# Patient Record
Sex: Female | Born: 1935 | Race: White | Hispanic: No | Smoking: Former smoker
Health system: Southern US, Community
[De-identification: ages and names within clinical notes are randomized; demographics above are authoritative.]

## PROBLEM LIST (undated history)

## (undated) DIAGNOSIS — I4821 Permanent atrial fibrillation: Secondary | ICD-10-CM

## (undated) DIAGNOSIS — M1611 Unilateral primary osteoarthritis, right hip: Secondary | ICD-10-CM

## (undated) DIAGNOSIS — M1990 Unspecified osteoarthritis, unspecified site: Secondary | ICD-10-CM

## (undated) DIAGNOSIS — R079 Chest pain, unspecified: Secondary | ICD-10-CM

## (undated) DIAGNOSIS — R7689 Other specified abnormal immunological findings in serum: Secondary | ICD-10-CM

## (undated) DIAGNOSIS — Z9889 Other specified postprocedural states: Secondary | ICD-10-CM

## (undated) DIAGNOSIS — I959 Hypotension, unspecified: Secondary | ICD-10-CM

## (undated) DIAGNOSIS — R300 Dysuria: Secondary | ICD-10-CM

## (undated) DIAGNOSIS — R609 Edema, unspecified: Secondary | ICD-10-CM

## (undated) DIAGNOSIS — E7800 Pure hypercholesterolemia, unspecified: Secondary | ICD-10-CM

## (undated) DIAGNOSIS — I2510 Atherosclerotic heart disease of native coronary artery without angina pectoris: Secondary | ICD-10-CM

## (undated) DIAGNOSIS — I739 Peripheral vascular disease, unspecified: Secondary | ICD-10-CM

## (undated) DIAGNOSIS — I4891 Unspecified atrial fibrillation: Secondary | ICD-10-CM

## (undated) DIAGNOSIS — Z7901 Long term (current) use of anticoagulants: Secondary | ICD-10-CM

## (undated) DIAGNOSIS — I209 Angina pectoris, unspecified: Secondary | ICD-10-CM

## (undated) DIAGNOSIS — C259 Malignant neoplasm of pancreas, unspecified: Secondary | ICD-10-CM

## (undated) DIAGNOSIS — J4489 Other specified chronic obstructive pulmonary disease: Secondary | ICD-10-CM

## (undated) DIAGNOSIS — D689 Coagulation defect, unspecified: Secondary | ICD-10-CM

## (undated) DIAGNOSIS — N183 Chronic kidney disease, stage 3 unspecified: Secondary | ICD-10-CM

## (undated) DIAGNOSIS — I878 Other specified disorders of veins: Secondary | ICD-10-CM

## (undated) DIAGNOSIS — N189 Chronic kidney disease, unspecified: Secondary | ICD-10-CM

## (undated) DIAGNOSIS — J42 Unspecified chronic bronchitis: Secondary | ICD-10-CM

## (undated) DIAGNOSIS — Z85528 Personal history of other malignant neoplasm of kidney: Secondary | ICD-10-CM

## (undated) DIAGNOSIS — D509 Iron deficiency anemia, unspecified: Secondary | ICD-10-CM

## (undated) DIAGNOSIS — Z1231 Encounter for screening mammogram for malignant neoplasm of breast: Secondary | ICD-10-CM

## (undated) DIAGNOSIS — I350 Nonrheumatic aortic (valve) stenosis: Secondary | ICD-10-CM

## (undated) DIAGNOSIS — C649 Malignant neoplasm of unspecified kidney, except renal pelvis: Secondary | ICD-10-CM

## (undated) DIAGNOSIS — Z9289 Personal history of other medical treatment: Secondary | ICD-10-CM

## (undated) DIAGNOSIS — T40695S Adverse effect of other narcotics, sequela: Secondary | ICD-10-CM

## (undated) DIAGNOSIS — N3941 Urge incontinence: Secondary | ICD-10-CM

## (undated) DIAGNOSIS — J449 Chronic obstructive pulmonary disease, unspecified: Secondary | ICD-10-CM

## (undated) DIAGNOSIS — Z5181 Encounter for therapeutic drug level monitoring: Secondary | ICD-10-CM

## (undated) DIAGNOSIS — D649 Anemia, unspecified: Secondary | ICD-10-CM

## (undated) DIAGNOSIS — R112 Nausea with vomiting, unspecified: Secondary | ICD-10-CM

## (undated) DIAGNOSIS — I6529 Occlusion and stenosis of unspecified carotid artery: Secondary | ICD-10-CM

## (undated) DIAGNOSIS — I429 Cardiomyopathy, unspecified: Secondary | ICD-10-CM

## (undated) DIAGNOSIS — Z950 Presence of cardiac pacemaker: Secondary | ICD-10-CM

## (undated) DIAGNOSIS — I5032 Chronic diastolic (congestive) heart failure: Secondary | ICD-10-CM

## (undated) DIAGNOSIS — Z95810 Presence of automatic (implantable) cardiac defibrillator: Secondary | ICD-10-CM

## (undated) DIAGNOSIS — Z9981 Dependence on supplemental oxygen: Secondary | ICD-10-CM

## (undated) DIAGNOSIS — F4322 Adjustment disorder with anxiety: Secondary | ICD-10-CM

## (undated) DIAGNOSIS — I10 Essential (primary) hypertension: Secondary | ICD-10-CM

## (undated) DIAGNOSIS — I272 Pulmonary hypertension, unspecified: Secondary | ICD-10-CM

## (undated) DIAGNOSIS — R0602 Shortness of breath: Secondary | ICD-10-CM

## (undated) DIAGNOSIS — N390 Urinary tract infection, site not specified: Secondary | ICD-10-CM

## (undated) DIAGNOSIS — K625 Hemorrhage of anus and rectum: Secondary | ICD-10-CM

## (undated) DIAGNOSIS — I5030 Unspecified diastolic (congestive) heart failure: Secondary | ICD-10-CM

## (undated) DIAGNOSIS — J018 Other acute sinusitis: Secondary | ICD-10-CM

## (undated) DIAGNOSIS — K921 Melena: Secondary | ICD-10-CM

## (undated) DIAGNOSIS — R5383 Other fatigue: Secondary | ICD-10-CM

## (undated) DIAGNOSIS — Z13820 Encounter for screening for osteoporosis: Secondary | ICD-10-CM

## (undated) DIAGNOSIS — R768 Other specified abnormal immunological findings in serum: Secondary | ICD-10-CM

## (undated) DIAGNOSIS — R001 Bradycardia, unspecified: Secondary | ICD-10-CM

## (undated) DIAGNOSIS — R5381 Other malaise: Secondary | ICD-10-CM

## (undated) DIAGNOSIS — Z01810 Encounter for preprocedural cardiovascular examination: Secondary | ICD-10-CM

## (undated) HISTORY — DX: Adjustment disorder with anxiety: F43.22

## (undated) HISTORY — DX: Hypotension, unspecified: I95.9

## (undated) HISTORY — DX: Melena: K92.1

## (undated) HISTORY — DX: Long term (current) use of anticoagulants: Z79.01

## (undated) HISTORY — DX: Urge incontinence: N39.41

## (undated) HISTORY — DX: Personal history of other malignant neoplasm of kidney: Z85.528

## (undated) HISTORY — DX: Other malaise: R53.81

## (undated) HISTORY — DX: Malignant neoplasm of unspecified kidney, except renal pelvis: C64.9

## (undated) HISTORY — DX: Encounter for preprocedural cardiovascular examination: Z01.810

## (undated) HISTORY — DX: Encounter for screening mammogram for malignant neoplasm of breast: Z12.31

## (undated) HISTORY — PX: JOINT REPLACEMENT: SHX530

## (undated) HISTORY — DX: Encounter for screening for osteoporosis: Z13.820

## (undated) HISTORY — DX: Chronic kidney disease, unspecified: N18.9

## (undated) HISTORY — DX: Hemorrhage of anus and rectum: K62.5

## (undated) HISTORY — DX: Other specified chronic obstructive pulmonary disease: J44.89

## (undated) HISTORY — DX: Bradycardia, unspecified: R00.1

## (undated) HISTORY — DX: Chest pain, unspecified: R07.9

## (undated) HISTORY — PX: CATARACT EXTRACTION W/ INTRAOCULAR LENS  IMPLANT, BILATERAL: SHX1307

## (undated) HISTORY — DX: Coagulation defect, unspecified: D68.9

## (undated) HISTORY — DX: Edema, unspecified: R60.9

## (undated) HISTORY — DX: Chronic obstructive pulmonary disease, unspecified: J44.9

## (undated) HISTORY — DX: Other fatigue: R53.83

## (undated) HISTORY — DX: Encounter for therapeutic drug level monitoring: Z51.81

## (undated) HISTORY — DX: Anemia, unspecified: D64.9

## (undated) HISTORY — DX: Dysuria: R30.0

## (undated) HISTORY — PX: APPENDECTOMY: SHX54

## (undated) HISTORY — DX: Essential (primary) hypertension: I10

## (undated) HISTORY — DX: Chronic diastolic (congestive) heart failure: I50.32

## (undated) HISTORY — DX: Occlusion and stenosis of unspecified carotid artery: I65.29

## (undated) HISTORY — DX: Other acute sinusitis: J01.80

## (undated) HISTORY — DX: Unspecified atrial fibrillation: I48.91

## (undated) HISTORY — DX: Cardiomyopathy, unspecified: I42.9

---

## 1973-05-06 HISTORY — PX: ABDOMINAL HYSTERECTOMY: SHX81

## 2001-06-06 DIAGNOSIS — C649 Malignant neoplasm of unspecified kidney, except renal pelvis: Secondary | ICD-10-CM

## 2001-06-06 HISTORY — DX: Malignant neoplasm of unspecified kidney, except renal pelvis: C64.9

## 2001-06-06 HISTORY — PX: NEPHRECTOMY: SHX65

## 2003-05-07 DIAGNOSIS — C649 Malignant neoplasm of unspecified kidney, except renal pelvis: Secondary | ICD-10-CM

## 2003-05-07 HISTORY — DX: Malignant neoplasm of unspecified kidney, except renal pelvis: C64.9

## 2004-05-06 HISTORY — PX: CARDIAC CATHETERIZATION: SHX172

## 2005-02-22 ENCOUNTER — Inpatient Hospital Stay (HOSPITAL_COMMUNITY): Admission: EM | Admit: 2005-02-22 | Discharge: 2005-03-02 | Payer: Self-pay | Admitting: Emergency Medicine

## 2005-02-22 ENCOUNTER — Ambulatory Visit: Payer: Self-pay | Admitting: Internal Medicine

## 2005-02-25 ENCOUNTER — Encounter: Payer: Self-pay | Admitting: Internal Medicine

## 2005-02-25 ENCOUNTER — Ambulatory Visit: Payer: Self-pay | Admitting: Internal Medicine

## 2005-03-11 ENCOUNTER — Ambulatory Visit: Payer: Self-pay

## 2005-03-25 ENCOUNTER — Ambulatory Visit: Payer: Self-pay | Admitting: Internal Medicine

## 2005-05-14 ENCOUNTER — Ambulatory Visit: Payer: Self-pay | Admitting: Internal Medicine

## 2005-05-17 ENCOUNTER — Ambulatory Visit: Payer: Self-pay | Admitting: Internal Medicine

## 2005-06-25 ENCOUNTER — Ambulatory Visit: Payer: Self-pay | Admitting: Internal Medicine

## 2005-07-22 ENCOUNTER — Ambulatory Visit: Payer: Self-pay | Admitting: Internal Medicine

## 2005-11-28 ENCOUNTER — Ambulatory Visit: Payer: Self-pay

## 2005-12-17 ENCOUNTER — Ambulatory Visit: Payer: Self-pay | Admitting: Internal Medicine

## 2005-12-31 ENCOUNTER — Ambulatory Visit: Payer: Self-pay

## 2005-12-31 ENCOUNTER — Encounter: Payer: Self-pay | Admitting: Cardiovascular Disease

## 2006-01-07 ENCOUNTER — Ambulatory Visit: Payer: Self-pay | Admitting: Ophthalmology

## 2006-01-13 ENCOUNTER — Ambulatory Visit: Payer: Self-pay | Admitting: Ophthalmology

## 2006-02-17 ENCOUNTER — Ambulatory Visit: Payer: Self-pay | Admitting: Ophthalmology

## 2006-02-24 ENCOUNTER — Ambulatory Visit: Payer: Self-pay | Admitting: Ophthalmology

## 2006-06-10 ENCOUNTER — Ambulatory Visit: Payer: Self-pay | Admitting: Internal Medicine

## 2006-06-25 ENCOUNTER — Ambulatory Visit: Payer: Self-pay

## 2006-06-25 ENCOUNTER — Encounter: Payer: Self-pay | Admitting: Cardiology

## 2006-07-22 ENCOUNTER — Ambulatory Visit: Payer: Self-pay | Admitting: Internal Medicine

## 2006-08-26 ENCOUNTER — Ambulatory Visit: Payer: Self-pay | Admitting: Internal Medicine

## 2006-08-30 ENCOUNTER — Emergency Department: Payer: Self-pay | Admitting: Emergency Medicine

## 2007-02-12 ENCOUNTER — Ambulatory Visit: Payer: Self-pay | Admitting: Internal Medicine

## 2007-03-04 ENCOUNTER — Ambulatory Visit: Payer: Self-pay

## 2007-03-04 ENCOUNTER — Encounter: Payer: Self-pay | Admitting: Internal Medicine

## 2007-03-04 LAB — CONVERTED CEMR LAB
ALT: 15 units/L (ref 0–35)
AST: 18 units/L (ref 0–37)
Albumin: 4.5 g/dL (ref 3.5–5.2)
Alkaline Phosphatase: 63 units/L (ref 39–117)
BUN: 27 mg/dL — ABNORMAL HIGH (ref 6–23)
CO2: 29 meq/L (ref 19–32)
Calcium: 10.1 mg/dL (ref 8.4–10.5)
Chloride: 100 meq/L (ref 96–112)
Cholesterol: 211 mg/dL — ABNORMAL HIGH (ref 0–200)
Creatinine, Ser: 1.36 mg/dL — ABNORMAL HIGH (ref 0.40–1.20)
Glucose, Bld: 85 mg/dL (ref 70–99)
HDL: 45 mg/dL (ref >39)
LDL Cholesterol: 120 mg/dL — ABNORMAL HIGH (ref 0–99)
Potassium: 4.1 meq/L (ref 3.5–5.3)
Sodium: 142 meq/L (ref 135–145)
Total Bilirubin: 0.5 mg/dL (ref 0.3–1.2)
Total CHOL/HDL Ratio: 4.7
Total Protein: 7.2 g/dL (ref 6.0–8.3)
Triglycerides: 230 mg/dL — ABNORMAL HIGH (ref <150)
VLDL: 46 mg/dL — ABNORMAL HIGH (ref 0–40)

## 2007-03-17 ENCOUNTER — Ambulatory Visit: Payer: Self-pay

## 2007-03-17 ENCOUNTER — Encounter: Payer: Self-pay | Admitting: Internal Medicine

## 2007-05-22 ENCOUNTER — Ambulatory Visit: Payer: Self-pay | Admitting: Internal Medicine

## 2007-06-03 ENCOUNTER — Ambulatory Visit: Payer: Self-pay | Admitting: Internal Medicine

## 2007-06-03 ENCOUNTER — Ambulatory Visit: Payer: Self-pay

## 2007-07-30 ENCOUNTER — Ambulatory Visit: Payer: Self-pay | Admitting: Internal Medicine

## 2007-07-31 ENCOUNTER — Encounter: Payer: Self-pay | Admitting: Internal Medicine

## 2007-07-31 LAB — CONVERTED CEMR LAB
ALT: 13 units/L (ref 0–35)
AST: 18 units/L (ref 0–37)
Albumin: 4.7 g/dL (ref 3.5–5.2)
Alkaline Phosphatase: 53 units/L (ref 39–117)
BUN: 22 mg/dL (ref 6–23)
Basophils Absolute: 0 10*3/uL (ref 0.0–0.1)
Basophils Relative: 0 % (ref 0–1)
CO2: 27 meq/L (ref 19–32)
Calcium: 9.8 mg/dL (ref 8.4–10.5)
Chloride: 99 meq/L (ref 96–112)
Creatinine, Ser: 1.44 mg/dL — ABNORMAL HIGH (ref 0.40–1.20)
Eosinophils Absolute: 0.1 10*3/uL (ref 0.0–0.7)
Eosinophils Relative: 2 % (ref 0–5)
Glucose, Bld: 81 mg/dL (ref 70–99)
HCT: 42.5 % (ref 36.0–46.0)
Hemoglobin: 13.6 g/dL (ref 12.0–15.0)
INR: 2.4 — ABNORMAL HIGH (ref 0.0–1.5)
Lymphocytes Relative: 22 % (ref 12–46)
Lymphs Abs: 1.2 10*3/uL (ref 0.7–4.0)
MCHC: 32 g/dL (ref 30.0–36.0)
MCV: 91.4 fL (ref 78.0–100.0)
Monocytes Absolute: 0.5 10*3/uL (ref 0.1–1.0)
Monocytes Relative: 9 % (ref 3–12)
Neutro Abs: 3.7 10*3/uL (ref 1.7–7.7)
Neutrophils Relative %: 66 % (ref 43–77)
Platelets: 212 10*3/uL (ref 150–400)
Potassium: 4.3 meq/L (ref 3.5–5.3)
Prothrombin Time: 27.3 s — ABNORMAL HIGH (ref 11.6–15.2)
RBC: 4.65 M/uL (ref 3.87–5.11)
RDW: 15.1 % (ref 11.5–15.5)
Sodium: 142 meq/L (ref 135–145)
Total Bilirubin: 0.5 mg/dL (ref 0.3–1.2)
Total Protein: 7.4 g/dL (ref 6.0–8.3)
WBC: 5.6 10*3/uL (ref 4.0–10.5)
aPTT: 61 s — ABNORMAL HIGH (ref 24–37)

## 2007-08-06 ENCOUNTER — Ambulatory Visit: Payer: Self-pay | Admitting: Internal Medicine

## 2007-08-07 ENCOUNTER — Ambulatory Visit: Payer: Self-pay | Admitting: Internal Medicine

## 2007-08-12 ENCOUNTER — Ambulatory Visit: Payer: Self-pay | Admitting: Cardiology

## 2007-08-19 ENCOUNTER — Ambulatory Visit (HOSPITAL_COMMUNITY): Admission: RE | Admit: 2007-08-19 | Discharge: 2007-08-19 | Payer: Self-pay | Admitting: Cardiology

## 2007-08-19 ENCOUNTER — Ambulatory Visit: Payer: Self-pay | Admitting: Internal Medicine

## 2007-08-24 ENCOUNTER — Ambulatory Visit: Payer: Self-pay | Admitting: Internal Medicine

## 2007-09-04 DIAGNOSIS — I6529 Occlusion and stenosis of unspecified carotid artery: Secondary | ICD-10-CM

## 2007-09-04 HISTORY — DX: Occlusion and stenosis of unspecified carotid artery: I65.29

## 2007-09-16 ENCOUNTER — Ambulatory Visit: Payer: Self-pay

## 2007-09-29 ENCOUNTER — Ambulatory Visit: Payer: Self-pay | Admitting: Internal Medicine

## 2007-11-23 ENCOUNTER — Ambulatory Visit: Payer: Self-pay

## 2007-12-04 ENCOUNTER — Ambulatory Visit: Payer: Self-pay | Admitting: Internal Medicine

## 2008-02-09 ENCOUNTER — Ambulatory Visit: Payer: Self-pay | Admitting: Internal Medicine

## 2008-02-24 ENCOUNTER — Encounter: Payer: Self-pay | Admitting: Internal Medicine

## 2008-02-24 ENCOUNTER — Ambulatory Visit: Payer: Self-pay | Admitting: Cardiology

## 2008-02-24 LAB — CONVERTED CEMR LAB
INR: 3.5 — ABNORMAL HIGH (ref 0.0–1.5)
Prothrombin Time: 38.3 s — ABNORMAL HIGH (ref 11.6–15.2)
aPTT: 89 s — ABNORMAL HIGH (ref 24–37)

## 2008-03-09 ENCOUNTER — Ambulatory Visit: Payer: Self-pay | Admitting: Internal Medicine

## 2008-03-09 LAB — CONVERTED CEMR LAB
INR: 3.6 — ABNORMAL HIGH (ref 0.0–1.5)
Prothrombin Time: 39 s — ABNORMAL HIGH (ref 11.6–15.2)
aPTT: 84 s — ABNORMAL HIGH (ref 24–37)

## 2008-03-22 ENCOUNTER — Ambulatory Visit: Payer: Self-pay | Admitting: Cardiology

## 2008-03-22 LAB — CONVERTED CEMR LAB
INR: 3.3 — ABNORMAL HIGH (ref 0.0–1.5)
Prothrombin Time: 36.1 s — ABNORMAL HIGH (ref 11.6–15.2)
aPTT: 62 s — ABNORMAL HIGH

## 2008-04-12 ENCOUNTER — Encounter: Payer: Self-pay | Admitting: Cardiology

## 2008-04-12 ENCOUNTER — Ambulatory Visit: Payer: Self-pay | Admitting: Cardiology

## 2008-04-12 LAB — CONVERTED CEMR LAB
INR: 2.5 — ABNORMAL HIGH (ref 0.0–1.5)
Prothrombin Time: 28.3 s — ABNORMAL HIGH (ref 11.6–15.2)

## 2008-05-11 ENCOUNTER — Ambulatory Visit: Payer: Self-pay | Admitting: Internal Medicine

## 2008-06-08 ENCOUNTER — Ambulatory Visit: Payer: Self-pay | Admitting: Internal Medicine

## 2008-06-08 LAB — CONVERTED CEMR LAB
INR: 2.6 — ABNORMAL HIGH (ref 0.0–1.5)
Prothrombin Time: 30 s — ABNORMAL HIGH (ref 11.6–15.2)

## 2008-06-09 ENCOUNTER — Encounter: Payer: Self-pay | Admitting: Internal Medicine

## 2008-07-11 ENCOUNTER — Encounter: Payer: Self-pay | Admitting: Internal Medicine

## 2008-07-11 ENCOUNTER — Ambulatory Visit: Payer: Self-pay | Admitting: Internal Medicine

## 2008-07-11 LAB — CONVERTED CEMR LAB
INR: 2.3 — ABNORMAL HIGH (ref 0.0–1.5)
Prothrombin Time: 26.4 s — ABNORMAL HIGH (ref 11.6–15.2)

## 2008-08-08 ENCOUNTER — Encounter: Payer: Self-pay | Admitting: Internal Medicine

## 2008-08-08 ENCOUNTER — Ambulatory Visit: Payer: Self-pay | Admitting: Cardiovascular Disease

## 2008-08-08 LAB — CONVERTED CEMR LAB
INR: 2 — ABNORMAL HIGH (ref 0.0–1.5)
Prothrombin Time: 24.2 s — ABNORMAL HIGH (ref 11.6–15.2)

## 2008-09-13 ENCOUNTER — Ambulatory Visit: Payer: Self-pay | Admitting: Cardiovascular Disease

## 2008-09-13 ENCOUNTER — Encounter: Payer: Self-pay | Admitting: Internal Medicine

## 2008-09-13 LAB — CONVERTED CEMR LAB
INR: 2.2 — ABNORMAL HIGH (ref 0.0–1.5)
Prothrombin Time: 26.2 s — ABNORMAL HIGH (ref 11.6–15.2)

## 2008-09-14 ENCOUNTER — Encounter: Payer: Self-pay | Admitting: Internal Medicine

## 2008-09-19 ENCOUNTER — Encounter: Payer: Self-pay | Admitting: Internal Medicine

## 2008-09-19 ENCOUNTER — Ambulatory Visit: Payer: Self-pay | Admitting: Internal Medicine

## 2008-10-04 ENCOUNTER — Ambulatory Visit: Payer: Self-pay | Admitting: Cardiology

## 2008-10-04 ENCOUNTER — Ambulatory Visit: Payer: Self-pay

## 2008-10-04 ENCOUNTER — Encounter: Payer: Self-pay | Admitting: Internal Medicine

## 2008-10-04 LAB — CONVERTED CEMR LAB
INR: 3.4 — ABNORMAL HIGH (ref 0.0–1.5)
Prothrombin Time: 36.7 s — ABNORMAL HIGH (ref 11.6–15.2)

## 2008-10-26 ENCOUNTER — Ambulatory Visit: Payer: Self-pay | Admitting: Cardiology

## 2008-10-27 ENCOUNTER — Telehealth: Payer: Self-pay | Admitting: Cardiology

## 2008-10-27 DIAGNOSIS — J449 Chronic obstructive pulmonary disease, unspecified: Secondary | ICD-10-CM | POA: Insufficient documentation

## 2008-10-27 DIAGNOSIS — I4821 Permanent atrial fibrillation: Secondary | ICD-10-CM | POA: Insufficient documentation

## 2008-10-27 DIAGNOSIS — I428 Other cardiomyopathies: Secondary | ICD-10-CM | POA: Insufficient documentation

## 2008-10-27 DIAGNOSIS — N183 Chronic kidney disease, stage 3 unspecified: Secondary | ICD-10-CM | POA: Insufficient documentation

## 2008-10-27 DIAGNOSIS — I495 Sick sinus syndrome: Secondary | ICD-10-CM | POA: Insufficient documentation

## 2008-10-27 DIAGNOSIS — I429 Cardiomyopathy, unspecified: Secondary | ICD-10-CM

## 2008-10-27 DIAGNOSIS — N189 Chronic kidney disease, unspecified: Secondary | ICD-10-CM | POA: Insufficient documentation

## 2008-10-27 DIAGNOSIS — R079 Chest pain, unspecified: Secondary | ICD-10-CM | POA: Insufficient documentation

## 2008-10-27 DIAGNOSIS — I4891 Unspecified atrial fibrillation: Secondary | ICD-10-CM

## 2008-10-27 DIAGNOSIS — N179 Acute kidney failure, unspecified: Secondary | ICD-10-CM | POA: Insufficient documentation

## 2008-10-27 DIAGNOSIS — I422 Other hypertrophic cardiomyopathy: Secondary | ICD-10-CM | POA: Insufficient documentation

## 2008-10-27 DIAGNOSIS — Z95810 Presence of automatic (implantable) cardiac defibrillator: Secondary | ICD-10-CM | POA: Insufficient documentation

## 2008-10-27 DIAGNOSIS — I6529 Occlusion and stenosis of unspecified carotid artery: Secondary | ICD-10-CM | POA: Insufficient documentation

## 2008-10-27 DIAGNOSIS — N1832 Chronic kidney disease, stage 3b: Secondary | ICD-10-CM | POA: Insufficient documentation

## 2008-10-27 LAB — CONVERTED CEMR LAB
INR: 1.4 (ref 0.0–1.5)
Prothrombin Time: 17.5 s — ABNORMAL HIGH (ref 11.6–15.2)

## 2008-11-10 ENCOUNTER — Ambulatory Visit: Payer: Self-pay | Admitting: Internal Medicine

## 2008-11-10 ENCOUNTER — Telehealth: Payer: Self-pay | Admitting: Internal Medicine

## 2008-11-17 LAB — CONVERTED CEMR LAB
INR: 2.2 — ABNORMAL HIGH (ref 0.0–1.5)
Prothrombin Time: 26.1 s — ABNORMAL HIGH (ref 11.6–15.2)

## 2008-11-18 ENCOUNTER — Telehealth: Payer: Self-pay | Admitting: Internal Medicine

## 2008-11-23 ENCOUNTER — Encounter: Payer: Self-pay | Admitting: Internal Medicine

## 2008-12-12 ENCOUNTER — Ambulatory Visit: Payer: Self-pay | Admitting: Internal Medicine

## 2008-12-12 ENCOUNTER — Encounter: Payer: Self-pay | Admitting: Internal Medicine

## 2008-12-14 LAB — CONVERTED CEMR LAB
INR: 2.2 — ABNORMAL HIGH (ref 0.0–1.5)
Prothrombin Time: 24.2 s — ABNORMAL HIGH (ref 11.6–15.2)

## 2008-12-16 ENCOUNTER — Ambulatory Visit: Payer: Self-pay | Admitting: Internal Medicine

## 2008-12-19 ENCOUNTER — Encounter: Payer: Self-pay | Admitting: *Deleted

## 2009-01-02 ENCOUNTER — Ambulatory Visit: Payer: Self-pay | Admitting: Internal Medicine

## 2009-01-30 ENCOUNTER — Encounter (INDEPENDENT_AMBULATORY_CARE_PROVIDER_SITE_OTHER): Payer: Self-pay | Admitting: *Deleted

## 2009-02-07 ENCOUNTER — Encounter: Payer: Self-pay | Admitting: Cardiology

## 2009-02-07 ENCOUNTER — Ambulatory Visit: Payer: Self-pay | Admitting: Internal Medicine

## 2009-02-07 LAB — CONVERTED CEMR LAB: POC INR: 3.1

## 2009-02-08 LAB — CONVERTED CEMR LAB
INR: 3.1 — ABNORMAL HIGH (ref 0.0–1.5)
Prothrombin Time: 31.5 s — ABNORMAL HIGH (ref 11.6–15.2)

## 2009-03-13 ENCOUNTER — Ambulatory Visit: Payer: Self-pay | Admitting: Internal Medicine

## 2009-03-13 ENCOUNTER — Encounter: Payer: Self-pay | Admitting: Internal Medicine

## 2009-03-13 LAB — CONVERTED CEMR LAB
INR: 1.93 — ABNORMAL HIGH (ref <1.50)
POC INR: 1.93
Prothrombin Time: 21.9 s
Prothrombin Time: 21.9 s — ABNORMAL HIGH (ref 11.6–15.2)

## 2009-03-20 ENCOUNTER — Ambulatory Visit: Payer: Self-pay | Admitting: Internal Medicine

## 2009-04-10 ENCOUNTER — Ambulatory Visit: Payer: Self-pay | Admitting: Family Medicine

## 2009-04-10 DIAGNOSIS — E782 Mixed hyperlipidemia: Secondary | ICD-10-CM | POA: Insufficient documentation

## 2009-04-10 DIAGNOSIS — F4322 Adjustment disorder with anxiety: Secondary | ICD-10-CM | POA: Insufficient documentation

## 2009-04-10 DIAGNOSIS — E785 Hyperlipidemia, unspecified: Secondary | ICD-10-CM | POA: Insufficient documentation

## 2009-04-10 DIAGNOSIS — N3941 Urge incontinence: Secondary | ICD-10-CM | POA: Insufficient documentation

## 2009-04-12 LAB — CONVERTED CEMR LAB
ALT: 18 units/L (ref 0–35)
AST: 27 units/L (ref 0–37)
Albumin: 4.3 g/dL (ref 3.5–5.2)
Alkaline Phosphatase: 54 units/L (ref 39–117)
BUN: 17 mg/dL (ref 6–23)
Bilirubin, Direct: 0 mg/dL (ref 0.0–0.3)
CO2: 35 meq/L — ABNORMAL HIGH (ref 19–32)
Calcium: 9.7 mg/dL (ref 8.4–10.5)
Chloride: 100 meq/L (ref 96–112)
Cholesterol: 218 mg/dL — ABNORMAL HIGH (ref 0–200)
Creatinine, Ser: 1.5 mg/dL — ABNORMAL HIGH (ref 0.4–1.2)
Direct LDL: 150.3 mg/dL
GFR calc non Af Amer: 36.17 mL/min (ref >60)
Glucose, Bld: 84 mg/dL (ref 70–99)
HDL: 47.9 mg/dL (ref >39.00)
Potassium: 3.6 meq/L (ref 3.5–5.1)
Sodium: 142 meq/L (ref 135–145)
Total Bilirubin: 0.7 mg/dL (ref 0.3–1.2)
Total CHOL/HDL Ratio: 5
Total Protein: 7.1 g/dL (ref 6.0–8.3)
Triglycerides: 177 mg/dL — ABNORMAL HIGH (ref 0.0–149.0)
VLDL: 35.4 mg/dL (ref 0.0–40.0)

## 2009-04-25 ENCOUNTER — Encounter: Payer: Self-pay | Admitting: Internal Medicine

## 2009-04-25 ENCOUNTER — Ambulatory Visit: Payer: Self-pay | Admitting: Cardiovascular Disease

## 2009-04-25 LAB — CONVERTED CEMR LAB
INR: 2.12
INR: 2.12 — ABNORMAL HIGH (ref <1.50)
Prothrombin Time: 23.6 s
Prothrombin Time: 23.6 s — ABNORMAL HIGH (ref 11.6–15.2)

## 2009-04-26 ENCOUNTER — Ambulatory Visit: Payer: Self-pay | Admitting: Family Medicine

## 2009-04-26 ENCOUNTER — Encounter: Payer: Self-pay | Admitting: Family Medicine

## 2009-05-23 ENCOUNTER — Ambulatory Visit: Payer: Self-pay | Admitting: Cardiology

## 2009-05-23 LAB — CONVERTED CEMR LAB
INR: 2.54
Prothrombin Time: 27.1 s

## 2009-05-25 LAB — CONVERTED CEMR LAB
INR: 2.54 — ABNORMAL HIGH (ref <1.50)
Prothrombin Time: 27.1 s — ABNORMAL HIGH (ref 11.6–15.2)

## 2009-07-13 ENCOUNTER — Encounter: Payer: Self-pay | Admitting: Internal Medicine

## 2009-07-14 ENCOUNTER — Ambulatory Visit: Payer: Self-pay | Admitting: Internal Medicine

## 2009-07-14 DIAGNOSIS — R609 Edema, unspecified: Secondary | ICD-10-CM | POA: Insufficient documentation

## 2009-07-14 DIAGNOSIS — I421 Obstructive hypertrophic cardiomyopathy: Secondary | ICD-10-CM | POA: Insufficient documentation

## 2009-07-17 ENCOUNTER — Encounter: Payer: Self-pay | Admitting: Cardiovascular Disease

## 2009-07-17 LAB — CONVERTED CEMR LAB
INR: 1.8
INR: 1.8 — ABNORMAL HIGH (ref <1.50)
Prothrombin Time: 20.7 s
Prothrombin Time: 20.7 s — ABNORMAL HIGH (ref 11.6–15.2)

## 2009-08-02 ENCOUNTER — Telehealth (INDEPENDENT_AMBULATORY_CARE_PROVIDER_SITE_OTHER): Payer: Self-pay | Admitting: *Deleted

## 2009-08-03 ENCOUNTER — Ambulatory Visit: Payer: Self-pay | Admitting: Cardiology

## 2009-08-03 ENCOUNTER — Ambulatory Visit (HOSPITAL_COMMUNITY): Admission: RE | Admit: 2009-08-03 | Discharge: 2009-08-03 | Payer: Self-pay | Admitting: Internal Medicine

## 2009-08-03 ENCOUNTER — Encounter: Payer: Self-pay | Admitting: Internal Medicine

## 2009-08-03 ENCOUNTER — Encounter (HOSPITAL_COMMUNITY): Admission: RE | Admit: 2009-08-03 | Discharge: 2009-10-04 | Payer: Self-pay | Admitting: Internal Medicine

## 2009-08-03 ENCOUNTER — Ambulatory Visit: Payer: Self-pay

## 2009-08-09 ENCOUNTER — Encounter: Payer: Self-pay | Admitting: Cardiology

## 2009-08-09 ENCOUNTER — Ambulatory Visit: Payer: Self-pay | Admitting: Cardiovascular Disease

## 2009-08-10 LAB — CONVERTED CEMR LAB
INR: 2.01
INR: 2.01 — ABNORMAL HIGH (ref <1.50)
Prothrombin Time: 22.6 s
Prothrombin Time: 22.6 s — ABNORMAL HIGH (ref 11.6–15.2)

## 2009-10-09 ENCOUNTER — Ambulatory Visit: Payer: Self-pay | Admitting: Cardiovascular Disease

## 2009-10-09 LAB — CONVERTED CEMR LAB: POC INR: 4.1

## 2009-10-12 ENCOUNTER — Telehealth: Payer: Self-pay | Admitting: Internal Medicine

## 2009-10-12 ENCOUNTER — Ambulatory Visit: Payer: Self-pay | Admitting: Family Medicine

## 2009-10-12 DIAGNOSIS — K921 Melena: Secondary | ICD-10-CM | POA: Insufficient documentation

## 2009-10-13 ENCOUNTER — Ambulatory Visit: Payer: Self-pay | Admitting: Gastroenterology

## 2009-10-13 DIAGNOSIS — K625 Hemorrhage of anus and rectum: Secondary | ICD-10-CM | POA: Insufficient documentation

## 2009-10-13 DIAGNOSIS — D689 Coagulation defect, unspecified: Secondary | ICD-10-CM | POA: Insufficient documentation

## 2009-10-13 HISTORY — DX: Hemorrhage of anus and rectum: K62.5

## 2009-10-13 LAB — CONVERTED CEMR LAB
Basophils Absolute: 0 10*3/uL (ref 0.0–0.1)
Basophils Relative: 0.6 % (ref 0.0–3.0)
Eosinophils Absolute: 0.2 10*3/uL (ref 0.0–0.7)
Eosinophils Relative: 6 % — ABNORMAL HIGH (ref 0.0–5.0)
HCT: 37.8 % (ref 36.0–46.0)
Hemoglobin: 13 g/dL (ref 12.0–15.0)
Lymphocytes Relative: 31.6 % (ref 12.0–46.0)
Lymphs Abs: 1.3 10*3/uL (ref 0.7–4.0)
MCHC: 34.5 g/dL (ref 30.0–36.0)
MCV: 90.6 fL (ref 78.0–100.0)
Monocytes Absolute: 0.5 10*3/uL (ref 0.1–1.0)
Monocytes Relative: 12.4 % — ABNORMAL HIGH (ref 3.0–12.0)
Neutro Abs: 2 10*3/uL (ref 1.4–7.7)
Neutrophils Relative %: 49.4 % (ref 43.0–77.0)
Platelets: 168 10*3/uL (ref 150.0–400.0)
RBC: 4.17 M/uL (ref 3.87–5.11)
RDW: 14.3 % (ref 11.5–14.6)
WBC: 4.1 10*3/uL — ABNORMAL LOW (ref 4.5–10.5)

## 2009-10-18 ENCOUNTER — Encounter: Payer: Self-pay | Admitting: Gastroenterology

## 2009-10-19 ENCOUNTER — Encounter (INDEPENDENT_AMBULATORY_CARE_PROVIDER_SITE_OTHER): Payer: Self-pay | Admitting: *Deleted

## 2009-10-23 ENCOUNTER — Ambulatory Visit: Payer: Self-pay | Admitting: Gastroenterology

## 2009-10-26 ENCOUNTER — Telehealth: Payer: Self-pay | Admitting: Gastroenterology

## 2009-10-27 ENCOUNTER — Encounter: Payer: Self-pay | Admitting: Gastroenterology

## 2009-10-30 ENCOUNTER — Telehealth: Payer: Self-pay | Admitting: Family Medicine

## 2009-11-02 ENCOUNTER — Ambulatory Visit: Payer: Self-pay | Admitting: Gastroenterology

## 2009-11-02 ENCOUNTER — Ambulatory Visit (HOSPITAL_COMMUNITY): Admission: RE | Admit: 2009-11-02 | Discharge: 2009-11-02 | Payer: Self-pay | Admitting: Gastroenterology

## 2009-11-02 LAB — HM COLONOSCOPY

## 2009-11-07 ENCOUNTER — Telehealth: Payer: Self-pay | Admitting: Gastroenterology

## 2009-11-16 ENCOUNTER — Ambulatory Visit: Payer: Self-pay | Admitting: Family Medicine

## 2009-11-16 DIAGNOSIS — R5381 Other malaise: Secondary | ICD-10-CM | POA: Insufficient documentation

## 2009-11-16 DIAGNOSIS — R5383 Other fatigue: Secondary | ICD-10-CM

## 2009-11-16 DIAGNOSIS — R300 Dysuria: Secondary | ICD-10-CM | POA: Insufficient documentation

## 2009-11-16 LAB — CONVERTED CEMR LAB
Bilirubin Urine: NEGATIVE
Glucose, Urine, Semiquant: NEGATIVE
Ketones, urine, test strip: NEGATIVE
Nitrite: NEGATIVE
Protein, U semiquant: 30
Specific Gravity, Urine: 1.015
Urobilinogen, UA: 0.2
pH: 6.5

## 2009-11-17 ENCOUNTER — Encounter: Payer: Self-pay | Admitting: Family Medicine

## 2009-11-20 LAB — CONVERTED CEMR LAB
BUN: 20 mg/dL (ref 6–23)
Basophils Absolute: 0.1 10*3/uL (ref 0.0–0.1)
Basophils Relative: 1 % (ref 0.0–3.0)
CO2: 31 meq/L (ref 19–32)
Calcium: 9.2 mg/dL (ref 8.4–10.5)
Chloride: 97 meq/L (ref 96–112)
Creatinine, Ser: 1.5 mg/dL — ABNORMAL HIGH (ref 0.4–1.2)
Eosinophils Absolute: 0.3 10*3/uL (ref 0.0–0.7)
Eosinophils Relative: 4.9 % (ref 0.0–5.0)
GFR calc non Af Amer: 36.96 mL/min (ref >60)
Glucose, Bld: 95 mg/dL (ref 70–99)
HCT: 40.3 % (ref 36.0–46.0)
Hemoglobin: 13.9 g/dL (ref 12.0–15.0)
Lymphocytes Relative: 19.2 % (ref 12.0–46.0)
Lymphs Abs: 1 10*3/uL (ref 0.7–4.0)
MCHC: 34.6 g/dL (ref 30.0–36.0)
MCV: 91.7 fL (ref 78.0–100.0)
Monocytes Absolute: 0.7 10*3/uL (ref 0.1–1.0)
Monocytes Relative: 13.4 % — ABNORMAL HIGH (ref 3.0–12.0)
Neutro Abs: 3.2 10*3/uL (ref 1.4–7.7)
Neutrophils Relative %: 61.5 % (ref 43.0–77.0)
Platelets: 204 10*3/uL (ref 150.0–400.0)
Potassium: 3.6 meq/L (ref 3.5–5.1)
RBC: 4.4 M/uL (ref 3.87–5.11)
RDW: 13.5 % (ref 11.5–14.6)
Sodium: 139 meq/L (ref 135–145)
TSH: 1.45 microintl units/mL (ref 0.35–5.50)
WBC: 5.2 10*3/uL (ref 4.5–10.5)

## 2009-11-24 ENCOUNTER — Telehealth: Payer: Self-pay | Admitting: Family Medicine

## 2009-12-06 ENCOUNTER — Ambulatory Visit: Payer: Self-pay | Admitting: Cardiovascular Disease

## 2009-12-06 LAB — CONVERTED CEMR LAB: POC INR: 3.5

## 2009-12-25 ENCOUNTER — Telehealth: Payer: Self-pay | Admitting: Family Medicine

## 2009-12-27 ENCOUNTER — Telehealth: Payer: Self-pay | Admitting: Internal Medicine

## 2009-12-27 ENCOUNTER — Telehealth: Payer: Self-pay | Admitting: Family Medicine

## 2010-01-19 ENCOUNTER — Ambulatory Visit: Payer: Self-pay | Admitting: Internal Medicine

## 2010-01-19 LAB — CONVERTED CEMR LAB: POC INR: 2.2

## 2010-01-23 ENCOUNTER — Telehealth: Payer: Self-pay | Admitting: Family Medicine

## 2010-02-02 ENCOUNTER — Encounter (INDEPENDENT_AMBULATORY_CARE_PROVIDER_SITE_OTHER): Payer: Self-pay | Admitting: *Deleted

## 2010-03-14 ENCOUNTER — Ambulatory Visit: Payer: Self-pay | Admitting: Family Medicine

## 2010-03-14 LAB — CONVERTED CEMR LAB
INR: 1.4
Prothrombin Time: 16.4 s

## 2010-03-15 ENCOUNTER — Ambulatory Visit: Payer: Self-pay | Admitting: Family Medicine

## 2010-03-15 DIAGNOSIS — J018 Other acute sinusitis: Secondary | ICD-10-CM | POA: Insufficient documentation

## 2010-03-16 ENCOUNTER — Telehealth: Payer: Self-pay | Admitting: Family Medicine

## 2010-04-02 ENCOUNTER — Telehealth: Payer: Self-pay | Admitting: Family Medicine

## 2010-04-17 ENCOUNTER — Ambulatory Visit: Payer: Self-pay | Admitting: Family Medicine

## 2010-04-17 ENCOUNTER — Telehealth: Payer: Self-pay | Admitting: Family Medicine

## 2010-04-17 LAB — CONVERTED CEMR LAB
INR: 2.5
Prothrombin Time: 30.6 s

## 2010-04-24 ENCOUNTER — Telehealth: Payer: Self-pay | Admitting: Internal Medicine

## 2010-04-24 ENCOUNTER — Ambulatory Visit: Payer: Self-pay | Admitting: Internal Medicine

## 2010-04-24 LAB — CONVERTED CEMR LAB
BUN: 21 mg/dL (ref 6–23)
Basophils Absolute: 0 10*3/uL (ref 0.0–0.1)
Basophils Relative: 1 % (ref 0.0–3.0)
CO2: 32 meq/L (ref 19–32)
Calcium: 9.7 mg/dL (ref 8.4–10.5)
Chloride: 100 meq/L (ref 96–112)
Creatinine, Ser: 1.4 mg/dL — ABNORMAL HIGH (ref 0.4–1.2)
Eosinophils Absolute: 0.2 10*3/uL (ref 0.0–0.7)
Eosinophils Relative: 6.2 % — ABNORMAL HIGH (ref 0.0–5.0)
GFR calc non Af Amer: 39.71 mL/min — ABNORMAL LOW (ref >60.00)
Glucose, Bld: 81 mg/dL (ref 70–99)
HCT: 39.2 % (ref 36.0–46.0)
Hemoglobin: 13.5 g/dL (ref 12.0–15.0)
INR: 2.8 — ABNORMAL HIGH (ref 0.8–1.0)
Lymphocytes Relative: 35.1 % (ref 12.0–46.0)
Lymphs Abs: 1.4 10*3/uL (ref 0.7–4.0)
MCHC: 34.3 g/dL (ref 30.0–36.0)
MCV: 90.3 fL (ref 78.0–100.0)
Monocytes Absolute: 0.4 10*3/uL (ref 0.1–1.0)
Monocytes Relative: 11 % (ref 3.0–12.0)
Neutro Abs: 1.8 10*3/uL (ref 1.4–7.7)
Neutrophils Relative %: 46.7 % (ref 43.0–77.0)
Platelets: 184 10*3/uL (ref 150.0–400.0)
Potassium: 3.8 meq/L (ref 3.5–5.1)
Prothrombin Time: 30.4 s — ABNORMAL HIGH (ref 9.7–11.8)
RBC: 4.34 M/uL (ref 3.87–5.11)
RDW: 14.2 % (ref 11.5–14.6)
Sodium: 140 meq/L (ref 135–145)
WBC: 3.9 10*3/uL — ABNORMAL LOW (ref 4.5–10.5)
aPTT: 48.2 s — ABNORMAL HIGH (ref 21.7–28.8)

## 2010-04-25 ENCOUNTER — Encounter: Payer: Self-pay | Admitting: Cardiovascular Disease

## 2010-05-04 ENCOUNTER — Ambulatory Visit (HOSPITAL_COMMUNITY)
Admission: RE | Admit: 2010-05-04 | Discharge: 2010-05-05 | Payer: Self-pay | Source: Home / Self Care | Attending: Internal Medicine | Admitting: Internal Medicine

## 2010-05-04 ENCOUNTER — Encounter: Payer: Self-pay | Admitting: Internal Medicine

## 2010-05-04 HISTORY — PX: BI-VENTRICULAR PACEMAKER UPGRADE: SHX5752

## 2010-05-14 ENCOUNTER — Telehealth: Payer: Self-pay | Admitting: Family Medicine

## 2010-05-17 ENCOUNTER — Encounter: Payer: Self-pay | Admitting: Internal Medicine

## 2010-05-17 ENCOUNTER — Ambulatory Visit: Admission: RE | Admit: 2010-05-17 | Discharge: 2010-05-17 | Payer: Self-pay | Source: Home / Self Care

## 2010-05-23 ENCOUNTER — Ambulatory Visit
Admission: RE | Admit: 2010-05-23 | Discharge: 2010-05-23 | Payer: Self-pay | Source: Home / Self Care | Attending: Family Medicine | Admitting: Family Medicine

## 2010-05-23 LAB — CONVERTED CEMR LAB
INR: 2.7
Prothrombin Time: 32.3 s

## 2010-05-24 ENCOUNTER — Telehealth: Payer: Self-pay | Admitting: Family Medicine

## 2010-05-28 NOTE — Discharge Summary (Signed)
[name], [name]             ACCOUNT NO.:  [id number]  MEDICAL RECORD NO.:  [id number]          PATIENT TYPE:  OIB  LOCATION:  6524                         FACILITY:  [facility]  PHYSICIAN:  Wallis Bamberg. Johnsie Cancel, MD, FACCDATE OF BIRTH:  10-Aug-1933  DATE OF ADMISSION:  05/04/2010 DATE OF DISCHARGE:  05/05/2010                              DISCHARGE SUMMARY   PRIMARY CARDIOLOGIST:  Champ Mungo. Lovena Le, MD  PRIMARY CARE PROVIDER:  [name], MD  DISCHARGE DIAGNOSIS:  Ischemic cardiomyopathy, status post implantable cardioverter-defibrillator generator change out.  SECONDARY DIAGNOSES: 1. Coronary artery disease. 2. Chronic kidney disease. 3. Chronic obstructive pulmonary disease. 4. Atrial fibrillation, on chronic Coumadin therapy. 5. History of sinus bradycardia. 6. History of hypotension.  ALLERGIES:  CODEINE and SULFA.  PROCEDURES:  Successful ICD generator change with placement of a new Medtronic Protecta XT DR [id number] dual-chamber AICD with placement of a new right ventricular lead and capping of the old right ventricular lead [id number] lead).  HISTORY OF PRESENT ILLNESS:  A 77 year old female who is recently seen in clinic by Dr. Lovena Le on April 24, 2010, secondary to a several- week history of her ICD beeping.  Upon interrogation, it was noted that the device was at elective replacement indicators.  Generator change was scheduled.  HOSPITAL COURSE:  The patient presented to [facility] on May 04, 2010 and underwent successful generator change to a new Medtronic Protecta XT DR dual-chamber AICD, serial number [id number].  As the patient previously had a 6949 right ventricular lead, this was capped and a new Medtronic Sprint Quattro Secure lead was placed.  The patient tolerated this procedure well and postprocedure did complain of some left rib pain though chest x-ray showed no evidence of pneumothorax.  An ICD device check showed normal function.  Pain has since  resolved and chest x-ray this morning remains within normal limits.  The patient will be discharged home today in good condition.  DISCHARGE LABS:  INR 2.17.  DISPOSITION:  The patient will be discharged home today in good condition.  FOLLOWUP PLANS AND APPOINTMENTS:  We will arrange for [facility] followup in approximately 10 days.  She will follow up with Dr. Lovena Le in approximately 3 months.  She follow up with Dr. Marjory Lies as previous scheduled.  DISCHARGE MEDICATIONS: 1. Ultram 50 mg q.12 h. p.r.n. 2. Xanax 0.25 mg q.8 h. p.r.n. 3. Aspirin 81 mg daily. 4. Carvedilol 12.5 mg b.i.d. 5. Disopyramide 150 mg b.i.d. 6. Furosemide 20 mg daily p.r.n. 7. Volume excess hydrochlorothiazide 25 mg daily. 8. Klor-Con 20 mEq daily. 9. Lipitor 40 mg daily. 10.Tylenol 325 mg daily p.r.n. 11.Verapamil SR 180 mg daily. 12.Coumadin 5 mg one tablet on Tuesdays and Thursdays, half a tab on     Monday, Wednesday, Friday, Saturday and Sunday.  Please note, the     patient is not on ACE inhibitor or ARB secondary to chronic kidney     disease.  OUTSTANDING LABS STUDIES:  None.  DURATION OF DISCHARGE ENCOUNTER:  Thirty five minutes including physician time.     Murray Hodgkins, ANP   ______________________________ Wallis Bamberg. Johnsie Cancel, MD,  [facility]    CB/MEDQ  D:  05/05/2010  T:  05/05/2010  Job:  [id number]  cc:   [name], M.D.  Electronically Signed by Murray Hodgkins ANP on 05/28/2010 03:40:15 PM Electronically Signed by Jenkins Rouge MD [facility] on 05/28/2010 05:26:15 PM

## 2010-06-02 LAB — CBC
HCT: 39.9 % (ref 36.0–46.0)
Hemoglobin: 13.1 g/dL (ref 12.0–15.0)
MCH: 29.1 pg (ref 26.0–34.0)
MCHC: 32.8 g/dL (ref 30.0–36.0)
MCV: 88.7 fL (ref 78.0–100.0)
Platelets: 155 K/uL (ref 150–400)
RBC: 4.5 MIL/uL (ref 3.87–5.11)
RDW: 13.6 % (ref 11.5–15.5)
WBC: 8.8 K/uL (ref 4.0–10.5)

## 2010-06-02 LAB — BASIC METABOLIC PANEL
BUN: 24 mg/dL — ABNORMAL HIGH (ref 6–23)
CO2: 29 mEq/L (ref 19–32)
Calcium: 9.7 mg/dL (ref 8.4–10.5)
Chloride: 97 mEq/L (ref 96–112)
Creatinine, Ser: 1.42 mg/dL — ABNORMAL HIGH (ref 0.4–1.2)
GFR calc Af Amer: 44 mL/min — ABNORMAL LOW (ref >60)
GFR calc non Af Amer: 36 mL/min — ABNORMAL LOW (ref >60)
Glucose, Bld: 116 mg/dL — ABNORMAL HIGH (ref 70–99)
Potassium: 3.8 mEq/L (ref 3.5–5.1)
Sodium: 138 mEq/L (ref 135–145)

## 2010-06-02 LAB — POCT CARDIAC MARKERS
CKMB, poc: 1.4 ng/mL (ref 1.0–8.0)
Myoglobin, poc: 120 ng/mL (ref 12–200)
Troponin i, poc: <0.05 ng/mL (ref 0.00–0.09)

## 2010-06-02 LAB — DIFFERENTIAL
Basophils Absolute: 0 10*3/uL (ref 0.0–0.1)
Basophils Relative: 0 % (ref 0–1)
Eosinophils Absolute: 0.2 10*3/uL (ref 0.0–0.7)
Eosinophils Relative: 2 % (ref 0–5)
Lymphocytes Relative: 13 % (ref 12–46)
Lymphs Abs: 1.1 10*3/uL (ref 0.7–4.0)
Monocytes Absolute: 0.8 10*3/uL (ref 0.1–1.0)
Monocytes Relative: 10 % (ref 3–12)
Neutro Abs: 6.6 10*3/uL (ref 1.7–7.7)
Neutrophils Relative %: 76 % (ref 43–77)

## 2010-06-02 LAB — SAMPLE TO BLOOD BANK

## 2010-06-02 LAB — APTT: aPTT: 52 seconds — ABNORMAL HIGH (ref 24–37)

## 2010-06-02 LAB — PROTIME-INR
INR: 2.85 — ABNORMAL HIGH (ref 0.00–1.49)
Prothrombin Time: 30 s — ABNORMAL HIGH (ref 11.6–15.2)

## 2010-06-03 ENCOUNTER — Inpatient Hospital Stay (HOSPITAL_COMMUNITY)
Admission: EM | Admit: 2010-06-03 | Discharge: 2010-06-08 | DRG: 481 | Disposition: A | Discharge status: Skilled Nursing Facility | Payer: PRIVATE HEALTH INSURANCE | Attending: Emergency Medicine | Admitting: Emergency Medicine

## 2010-06-03 DIAGNOSIS — E785 Hyperlipidemia, unspecified: Secondary | ICD-10-CM | POA: Diagnosis present

## 2010-06-03 DIAGNOSIS — Z7982 Long term (current) use of aspirin: Secondary | ICD-10-CM

## 2010-06-03 DIAGNOSIS — Z79899 Other long term (current) drug therapy: Secondary | ICD-10-CM

## 2010-06-03 DIAGNOSIS — W010XXA Fall on same level from slipping, tripping and stumbling without subsequent striking against object, initial encounter: Secondary | ICD-10-CM | POA: Diagnosis present

## 2010-06-03 DIAGNOSIS — J449 Chronic obstructive pulmonary disease, unspecified: Secondary | ICD-10-CM | POA: Diagnosis present

## 2010-06-03 DIAGNOSIS — N183 Chronic kidney disease, stage 3 unspecified: Secondary | ICD-10-CM | POA: Diagnosis present

## 2010-06-03 DIAGNOSIS — Z8553 Personal history of malignant neoplasm of renal pelvis: Secondary | ICD-10-CM

## 2010-06-03 DIAGNOSIS — I672 Cerebral atherosclerosis: Secondary | ICD-10-CM | POA: Diagnosis present

## 2010-06-03 DIAGNOSIS — Y929 Unspecified place or not applicable: Secondary | ICD-10-CM

## 2010-06-03 DIAGNOSIS — R071 Chest pain on breathing: Secondary | ICD-10-CM | POA: Diagnosis present

## 2010-06-03 DIAGNOSIS — Z95810 Presence of automatic (implantable) cardiac defibrillator: Secondary | ICD-10-CM

## 2010-06-03 DIAGNOSIS — I129 Hypertensive chronic kidney disease with stage 1 through stage 4 chronic kidney disease, or unspecified chronic kidney disease: Secondary | ICD-10-CM | POA: Diagnosis present

## 2010-06-03 DIAGNOSIS — Z886 Allergy status to analgesic agent status: Secondary | ICD-10-CM

## 2010-06-03 DIAGNOSIS — Z905 Acquired absence of kidney: Secondary | ICD-10-CM

## 2010-06-03 DIAGNOSIS — E876 Hypokalemia: Secondary | ICD-10-CM | POA: Diagnosis not present

## 2010-06-03 DIAGNOSIS — J4489 Other specified chronic obstructive pulmonary disease: Secondary | ICD-10-CM | POA: Diagnosis present

## 2010-06-03 DIAGNOSIS — R0789 Other chest pain: Secondary | ICD-10-CM | POA: Diagnosis present

## 2010-06-03 DIAGNOSIS — I2510 Atherosclerotic heart disease of native coronary artery without angina pectoris: Secondary | ICD-10-CM | POA: Diagnosis present

## 2010-06-03 DIAGNOSIS — Z7901 Long term (current) use of anticoagulants: Secondary | ICD-10-CM

## 2010-06-03 DIAGNOSIS — I2589 Other forms of chronic ischemic heart disease: Secondary | ICD-10-CM | POA: Diagnosis present

## 2010-06-03 DIAGNOSIS — Z882 Allergy status to sulfonamides status: Secondary | ICD-10-CM

## 2010-06-03 DIAGNOSIS — S72009A Fracture of unspecified part of neck of unspecified femur, initial encounter for closed fracture: Principal | ICD-10-CM | POA: Diagnosis present

## 2010-06-03 DIAGNOSIS — I4891 Unspecified atrial fibrillation: Secondary | ICD-10-CM | POA: Diagnosis present

## 2010-06-03 DIAGNOSIS — N179 Acute kidney failure, unspecified: Secondary | ICD-10-CM | POA: Diagnosis not present

## 2010-06-03 HISTORY — DX: Malignant neoplasm of unspecified kidney, except renal pelvis: C64.9

## 2010-06-03 LAB — PROTIME-INR
INR: 3.16 — ABNORMAL HIGH (ref 0.00–1.49)
Prothrombin Time: 32.5 seconds — ABNORMAL HIGH (ref 11.6–15.2)

## 2010-06-03 LAB — COMPREHENSIVE METABOLIC PANEL
ALT: 19 U/L (ref 0–35)
AST: 26 U/L (ref 0–37)
Albumin: 3.7 g/dL (ref 3.5–5.2)
Alkaline Phosphatase: 47 U/L (ref 39–117)
BUN: 22 mg/dL (ref 6–23)
CO2: 26 mEq/L (ref 19–32)
Calcium: 9 mg/dL (ref 8.4–10.5)
Chloride: 98 mEq/L (ref 96–112)
Creatinine, Ser: 1.39 mg/dL — ABNORMAL HIGH (ref 0.4–1.2)
GFR calc Af Amer: 45 mL/min — ABNORMAL LOW (ref >60)
GFR calc non Af Amer: 37 mL/min — ABNORMAL LOW (ref >60)
Glucose, Bld: 146 mg/dL — ABNORMAL HIGH (ref 70–99)
Potassium: 3.2 mEq/L — ABNORMAL LOW (ref 3.5–5.1)
Sodium: 135 mEq/L (ref 135–145)
Total Bilirubin: 0.7 mg/dL (ref 0.3–1.2)
Total Protein: 6.1 g/dL (ref 6.0–8.3)

## 2010-06-03 LAB — MAGNESIUM: Magnesium: 2.1 mg/dL (ref 1.5–2.5)

## 2010-06-03 LAB — ABO/RH: ABO/RH(D): A NEG

## 2010-06-04 LAB — DIFFERENTIAL
Basophils Absolute: 0 10*3/uL (ref 0.0–0.1)
Basophils Relative: 0 % (ref 0–1)
Eosinophils Absolute: 0.1 10*3/uL (ref 0.0–0.7)
Eosinophils Relative: 2 % (ref 0–5)
Lymphocytes Relative: 16 % (ref 12–46)
Lymphs Abs: 0.9 10*3/uL (ref 0.7–4.0)
Monocytes Absolute: 0.5 10*3/uL (ref 0.1–1.0)
Monocytes Relative: 9 % (ref 3–12)
Neutro Abs: 4.3 10*3/uL (ref 1.7–7.7)
Neutrophils Relative %: 73 % (ref 43–77)

## 2010-06-04 LAB — COMPREHENSIVE METABOLIC PANEL
ALT: 22 U/L (ref 0–35)
AST: 29 U/L (ref 0–37)
Albumin: 3.5 g/dL (ref 3.5–5.2)
Alkaline Phosphatase: 50 U/L (ref 39–117)
BUN: 22 mg/dL (ref 6–23)
CO2: 29 mEq/L (ref 19–32)
Calcium: 8.9 mg/dL (ref 8.4–10.5)
Chloride: 95 mEq/L — ABNORMAL LOW (ref 96–112)
Creatinine, Ser: 1.41 mg/dL — ABNORMAL HIGH (ref 0.4–1.2)
GFR calc Af Amer: 44 mL/min — ABNORMAL LOW (ref >60)
GFR calc non Af Amer: 36 mL/min — ABNORMAL LOW (ref >60)
Glucose, Bld: 109 mg/dL — ABNORMAL HIGH (ref 70–99)
Potassium: 3 mEq/L — ABNORMAL LOW (ref 3.5–5.1)
Sodium: 139 mEq/L (ref 135–145)
Total Bilirubin: 0.5 mg/dL (ref 0.3–1.2)
Total Protein: 6.1 g/dL (ref 6.0–8.3)

## 2010-06-04 LAB — PREPARE FRESH FROZEN PLASMA
Unit division: 0
Unit division: 0

## 2010-06-04 LAB — CBC
HCT: 34.5 % — ABNORMAL LOW (ref 36.0–46.0)
Hemoglobin: 11.5 g/dL — ABNORMAL LOW (ref 12.0–15.0)
MCH: 29.5 pg (ref 26.0–34.0)
MCHC: 33.3 g/dL (ref 30.0–36.0)
MCV: 88.5 fL (ref 78.0–100.0)
Platelets: 134 10*3/uL — ABNORMAL LOW (ref 150–400)
RBC: 3.9 MIL/uL (ref 3.87–5.11)
RDW: 13.8 % (ref 11.5–15.5)
WBC: 5.9 10*3/uL (ref 4.0–10.5)

## 2010-06-04 LAB — PROTIME-INR
INR: 2.38 — ABNORMAL HIGH (ref 0.00–1.49)
INR: 1.68 — ABNORMAL HIGH (ref 0.00–1.49)
Prothrombin Time: 26.1 seconds — ABNORMAL HIGH (ref 11.6–15.2)
Prothrombin Time: 20 seconds — ABNORMAL HIGH (ref 11.6–15.2)

## 2010-06-04 LAB — MAGNESIUM: Magnesium: 2 mg/dL (ref 1.5–2.5)

## 2010-06-05 LAB — BASIC METABOLIC PANEL
BUN: 13 mg/dL (ref 6–23)
CO2: 31 mEq/L (ref 19–32)
Calcium: 8.9 mg/dL (ref 8.4–10.5)
Chloride: 98 mEq/L (ref 96–112)
Creatinine, Ser: 1.2 mg/dL (ref 0.4–1.2)
GFR calc Af Amer: 53 mL/min — ABNORMAL LOW (ref >60)
GFR calc non Af Amer: 44 mL/min — ABNORMAL LOW (ref >60)
Glucose, Bld: 112 mg/dL — ABNORMAL HIGH (ref 70–99)
Potassium: 4 mEq/L (ref 3.5–5.1)
Sodium: 138 mEq/L (ref 135–145)

## 2010-06-05 LAB — PREPARE FRESH FROZEN PLASMA
Unit division: 0
Unit division: 0
Unit division: 0

## 2010-06-05 LAB — CBC
HCT: 34.4 % — ABNORMAL LOW (ref 36.0–46.0)
Hemoglobin: 11.3 g/dL — ABNORMAL LOW (ref 12.0–15.0)
MCH: 29.4 pg (ref 26.0–34.0)
MCHC: 32.8 g/dL (ref 30.0–36.0)
MCV: 89.6 fL (ref 78.0–100.0)
Platelets: 125 10*3/uL — ABNORMAL LOW (ref 150–400)
RBC: 3.84 MIL/uL — ABNORMAL LOW (ref 3.87–5.11)
RDW: 13.5 % (ref 11.5–15.5)
WBC: 5.6 10*3/uL (ref 4.0–10.5)

## 2010-06-05 LAB — DIFFERENTIAL
Basophils Absolute: 0 10*3/uL (ref 0.0–0.1)
Basophils Relative: 0 % (ref 0–1)
Eosinophils Absolute: 0.2 10*3/uL (ref 0.0–0.7)
Eosinophils Relative: 3 % (ref 0–5)
Lymphocytes Relative: 18 % (ref 12–46)
Lymphs Abs: 1 10*3/uL (ref 0.7–4.0)
Monocytes Absolute: 0.7 10*3/uL (ref 0.1–1.0)
Monocytes Relative: 13 % — ABNORMAL HIGH (ref 3–12)
Neutro Abs: 3.7 10*3/uL (ref 1.7–7.7)
Neutrophils Relative %: 66 % (ref 43–77)

## 2010-06-05 LAB — HEPARIN LEVEL (UNFRACTIONATED): Heparin Unfractionated: 0.12 IU/mL — ABNORMAL LOW (ref 0.30–0.70)

## 2010-06-05 LAB — PROTIME-INR
INR: 2.53 — ABNORMAL HIGH (ref 0.00–1.49)
Prothrombin Time: 27.4 seconds — ABNORMAL HIGH (ref 11.6–15.2)

## 2010-06-05 LAB — APTT: aPTT: 128 seconds — ABNORMAL HIGH (ref 24–37)

## 2010-06-05 NOTE — Assessment & Plan Note (Signed)
Summary: rectal bleeding, bowel changes/pl    History of Present Illness Visit Type: consult  Primary GI MD: Verl Blalock MD FACP FAGA Primary Provider: [name] MD Requesting Provider: [name] MD Chief Complaint: Rectal bleeding  History of Present Illness:   Patient is a 77 year old female with multiple medical problems. Here with daughter. Referred her for acute rectal bleeding. Had several episodes of rectal bleeding yesterday. She did have some lower abdominal discomfort last night. She hasn't been constipated, BMs normal. She has no anal pain. Has been dizzy for about six months, she thinks it may be related to one of her BP meds. Some SOB but sounds more of a chronic problem. She has occasional  RLQ pain unrelated to meals or defecation. Pain mostly when in bed and turns over on right side. No GERD symptoms. Weight stable. Her last bloody stool was 7pm last night.   Last colonoscopy approximately seven years ago by Dr. Nicolasa Ducking in [place] who is now deceased.    GI Review of Systems     Location of  Abdominal pain: lower abdomen.    Denies abdominal pain, acid reflux, belching, bloating, chest pain, dysphagia with liquids, dysphagia with solids, heartburn, loss of appetite, nausea, vomiting, vomiting blood, weight loss, and  weight gain.      Reports rectal bleeding.     Denies anal fissure, black tarry stools, change in bowel habit, constipation, diarrhea, diverticulosis, fecal incontinence, heme positive stool, hemorrhoids, irritable bowel syndrome, jaundice, light color stool, liver problems, and  rectal pain.    Current Medications (verified): 1)  Disopyramide Phosphate 150 Mg Caps (Disopyramide Phosphate) .Marland Kitchen.. 1 Cap Two Times A Day 2)  Carvedilol 12.5 Mg Tabs (Carvedilol) .Marland Kitchen.. 1 Tab Two Times A Day 3)  Warfarin Sodium 5 Mg Tabs (Warfarin Sodium) .... Use As Directed By Anticoagulation Clinic 4)  Aspirin 81 Mg Tbec (Aspirin) .... Take One Tablet By Mouth Daily 5)   Hydrochlorothiazide 25 Mg Tabs (Hydrochlorothiazide) .... Take One Tablet By Mouth Daily. 6)  Simvastatin 80 Mg Tabs (Simvastatin) .... Take One Tablet By Mouth Daily At Bedtime 7)  Verapamil Hcl Cr 180 Mg Xr24h-Cap (Verapamil Hcl) .... Take One Capsule By Mouth Daily 8)  Alprazolam 0.25 Mg Tabs (Alprazolam) .Marland Kitchen.. 1 Tab Every Night As Needed For Anxiety. 9)  Furosemide 20 Mg Tabs (Furosemide) .... Take One Tablet By Mouth Daily As Needed 10)  Klor-Con M20 20 Meq Cr-Tabs (Potassium Chloride Crys Cr) .Marland Kitchen.. 1 Tab By Mouth Daily With Furosemide  Allergies (verified): 1)  ! Codeine 2)  ! Sulfa  Past History:  Past Medical History: RENAL INSUFFICIENCY, CHRONIC (ICD-585.9) COPD (ICD-496) ATRIAL FIBRILLATION (ICD-427.31) CAROTID ARTERY STENOSIS (ICD-433.10)    --60-79% bilat (stable) 5/09    --40-59% R 60-79% 6/10 CHEST PAIN-UNSPECIFIED (ICD-786.50) ICD - IN SITU (ICD-V45.02) SINUS BRADYCARDIA (ICD-427.81) HYPOTENSION (ICD-458.9) Cardiac cath 2006    --non-obstructive CAD 30-40s lesions   --ETT 1/09 nondiagnostic due to poor HR response Right Renal cancer 2003    Past Surgical History: Reviewed history from 04/10/2009 and no changes required. ICD - Medtronic maximal DR - 02/28/05 Hysterectomy 1975 for benign causes Cataract extraction Nephrectomy right s/p renal cell CA  Family History: No FH of Colon Cancer:  Social History: Reviewed history from 04/10/2009 and no changes required. Retired  Tobacco Use - Former. 1/2 pack x 40 yrs till 2003 Alcohol Use - no Widow/Widower Children and grandchildren all live close by but she lives alone. Very active in her church,  has group of 4 best friends, self titled "the [employer]!"  Review of Systems       The patient complains of allergy/sinus, arthritis/joint pain, back pain, blood in urine, heart murmur, heart rhythm changes, shortness of breath, swelling of feet/legs, and urine leakage.  The patient denies anemia, anxiety-new,  breast changes/lumps, change in vision, confusion, cough, coughing up blood, depression-new, fainting, fatigue, fever, headaches-new, hearing problems, itching, menstrual pain, muscle pains/cramps, night sweats, nosebleeds, pregnancy symptoms, skin rash, sleeping problems, sore throat, swollen lymph glands, thirst - excessive , urination - excessive , urination changes/pain, vision changes, and voice change.    Vital Signs:  Patient profile:   77 year old female Height:      67.5 inches Weight:      174 pounds BMI:     26.95 BSA:     1.92 Pulse rate:   76 / minute Pulse rhythm:   regular BP sitting:   146 / 82  (left arm) Cuff size:   regular  Vitals Entered By: Hope Pigeon CMA (October 13, 2009 10:35 AM)  Physical Exam  General:  Well developed, well nourished, no acute distress. Head:  Normocephalic and atraumatic. Eyes:  Conjunctiva pink, no icterus.  Mouth:  No oral lesions. Tongue moist.  Neck:  no obvious masses  Lungs:  Clear throughout to auscultation. Heart:  Regular rate and rhythm; no murmurs, rubs,  or bruits. Abdomen:  Abdomen soft, nontender, nondistended. No obvious masses or hepatomegaly.Normal bowel sounds.  Rectal:  No fissures seen. No internal masses or hemorrhoids. Heme negative stool Msk:  Symmetrical with no gross deformities. Normal posture. Extremities:  No palmar erythema, no edema.  Neurologic:  Alert and  oriented x4;  grossly normal neurologically. Skin:  Intact without significant lesions or rashes. Cervical Nodes:  No significant cervical adenopathy. Psych:  Alert and cooperative. Normal mood and affect.   Impression & Recommendations:  Problem # 1:  RECTAL BLEEDING (ICD-569.3) Assessment New Couldn't visualize anal fissure seen by PCP but digital rectal exam was slightly uncomfortable and that does suggest fissure. Patient's last colonoscopy was several years ago in [place]. Her rectal bleeding needs further workup.  The patient will be  scheduled for a colonoscopy with biopsies/polypectomy (if indicated).  The risks and benefits of the procedure, as well as alternatives were discussed with the patient and she agrees to proceed. She is on Coumadin and we will need to contact cardiologist about holding it. Also, patient  has multiple cardiac issues, including a pacemaker/ defibrillator. Her colonoscopy will need to be done at [facility]. Check CBC today.  Orders: TLB-CBC Platelet - w/Differential (85025-CBCD)  Problem # 2:  COUMADIN THERAPY (ICD-V58.61) Assessment: Comment Only  Orders: TLB-CBC Platelet - w/Differential (85025-CBCD)  Problem # 3:  ICD - IN SITU (ICD-V45.02) Assessment: Comment Only  Problem # 4:  HOCM / IHSS (ICD-425.1) Assessment: Comment Only  Problem # 5:  Hx of CARCINOMA, RENAL CELL (ICD-189.0) History of right nephrectomy.  Patient Instructions: 1)  Please go to lab, basement level. 2)  We will be calling you with a date for the procedure at [facility]. 3)  Dr Owens Loffler may be performing this.  Dr. Sharlett Iles usually has him do the procedures at the hospital for him. 4)  We will also set you up with a nurse visit prior to the procedure date so you can sign the paperwork.   5)  We will also call you once we hear from Dr. Haroldine Laws regarding  the coumadin. 6)  Copy sent to : [name], MD 7)                         Owens Loffler, MD

## 2010-06-05 NOTE — Progress Notes (Signed)
Summary: wants protimes here  Phone Note Call from Patient Call back at Home Phone [phone]   Caller: Patient Call For: [name] MD Summary of Call: Pt has been getting protimes at cardiology office but she has a $40.00 copay there every time.  She is asking if she can start getting that here, and have you follow her.  Her cost will be less here. Initial call taken by: Marty Heck CMA,  December 27, 2009 12:46 PM  Follow-up for Phone Call        yes. [name] MD  December 27, 2009 2:42 PM      Appended Document: wants protimes here Spoke with pt and advised her, she will call for lab appt.

## 2010-06-05 NOTE — Letter (Signed)
Summary: Device-Delinquent Check  [place], [place] [address] [place]   [place], [place] [address]   Phone: [phone]  Fax: [phone]     February 02, 2010 MRN: [id number]   KARIAN SIMONTON [address] [place], [place]  [address]   Dear Ms. Oncale,  According to our records, you have not had your implanted device checked in the recommended period of time.  We are unable to determine appropriate device function without checking your device on a regular basis.  Please call our office to schedule an appointment with Dr Lovena Le, as soon as possible.  If you are having your device checked by another physician, please call us so that we may update our records.  Thank you, Gasper Sells, EMT  February 02, 2010 10:38 AM   [facility]

## 2010-06-05 NOTE — Miscellaneous (Signed)
Summary: [facility] COLON, 569.3,V58.61  Clinical Lists Changes  Medications: Added new medication of MOVIPREP 100 GM  SOLR (PEG-KCL-NACL-NASULF-NA ASC-C) As directed - Signed Rx of MOVIPREP 100 GM  SOLR (PEG-KCL-NACL-NASULF-NA ASC-C) As directed;  #1 x 0;  Signed;  Entered by: Ernestine Conrad RN;  Authorized by: Milus Banister MD;  Method used: Electronically to [facility]*, [address], [place], [place]  [address], Ph: [phone], Fax: [phone] Observations: Added new observation of ALLERGY REV: Done (10/23/2009 14:01)    Prescriptions: MOVIPREP 100 GM  SOLR (PEG-KCL-NACL-NASULF-NA ASC-C) As directed  #1 x 0   Entered by:   Ernestine Conrad RN   Authorized by:   Milus Banister MD   Signed by:   Ernestine Conrad RN on 10/23/2009   Method used:   Electronically to        [facility] (retail)       [address]       [place], [place]  [address]       Ph: [phone]       Fax: [phone]   RxID:   [id number]

## 2010-06-05 NOTE — Progress Notes (Signed)
Summary: refill request for alprazolam  Phone Note Refill Request Message from:  Fax from Pharmacy  Refills Requested: Medication #1:  ALPRAZOLAM 0.25 MG TABS 1 tab every night as needed for anxiety.   Last Refilled: 12/26/2009 Faxed request from [facility], [phone].  Initial call taken by: Marty Heck CMA,  January 23, 2010 9:46 AM  Follow-up for Phone Call        Rx called to pharmacy Follow-up by: Sherrian Divers CMA Deborra Medina),  January 23, 2010 10:07 AM    Prescriptions: ALPRAZOLAM 0.25 MG TABS (ALPRAZOLAM) 1 tab every night as needed for anxiety.  #30 x 0   Entered and Authorized by:   [name] MD   Signed by:   [name] MD on 01/23/2010   Method used:   Telephoned to ...       [facility] (retail)       [address]       [place], [place]  [address]       Ph: [phone]       Fax: [phone]   RxID:   [id number]

## 2010-06-05 NOTE — Miscellaneous (Signed)
Summary: rx carvedilol, disopyramide  Clinical Lists Changes  Medications: Added new medication of DISOPYRAMIDE PHOSPHATE 150 MG CAPS (DISOPYRAMIDE PHOSPHATE) 1 cap two times a day - Signed Added new medication of CARVEDILOL 12.5 MG TABS (CARVEDILOL) 1 tab two times a day - Signed Rx of DISOPYRAMIDE PHOSPHATE 150 MG CAPS (DISOPYRAMIDE PHOSPHATE) 1 cap two times a day;  #60 x 11;  Signed;  Entered by: Julaine Hua, CMA;  Authorized by: Jolaine Artist, MD, [facility];  Method used: Electronically to [facility]*, [address], [place], [place]  [address], Ph: [phone], Fax: [phone] Rx of CARVEDILOL 12.5 MG TABS (CARVEDILOL) 1 tab two times a day;  #60 x 11;  Signed;  Entered by: Julaine Hua, CMA;  Authorized by: Jolaine Artist, MD, [facility];  Method used: Electronically to [facility]*, [address], [place], [place]  [address], Ph: [phone], Fax: [phone]    Prescriptions: CARVEDILOL 12.5 MG TABS (CARVEDILOL) 1 tab two times a day  #60 x 11   Entered by:   Julaine Hua, CMA   Authorized by:   Jolaine Artist, MD, [facility]   Signed by:   Julaine Hua, CMA on 09/14/2008   Method used:   Electronically to        [facility] (retail)       [address]       [place], [place]  [address]       Ph: [phone]       Fax: [phone]   [id number] DISOPYRAMIDE PHOSPHATE 150 MG CAPS (DISOPYRAMIDE PHOSPHATE) 1 cap two times a day  #60 x 11   Entered by:   Julaine Hua, CMA   Authorized by:   Jolaine Artist, MD, [facility]   Signed by:   Julaine Hua, CMA on 09/14/2008   Method used:   Electronically to        [facility] (retail)       [address]       [place], [place]  [address]       Ph: [phone]       Fax: [phone]   RxID:   [id number]

## 2010-06-05 NOTE — Assessment & Plan Note (Signed)
Summary: Cardiology Nuclear Study  Nuclear Med Background Indications for Stress Test: Evaluation for Ischemia   History: COPD, Defibrillator, Echo, Emphysema, GXT, Heart Catheterization  History Comments: '06 Cath: N/O CAD, mild pulm. HTN; '06 AICD; '09 GXT: Non Dx, poor HR response; 08/03/09 Echo:EF=55-60%, mild-moderate MR; h/o PAF  Symptoms: Chest Pain, Chest Pain with Exertion, Chest Pressure, Chest Pressure with Exertion, Chest Tightness, Chest Tightness with Exertion, Dizziness, DOE, Fatigue, Palpitations, Rapid HR  Symptoms Comments: Last episode of [id number] am, 6/10; none now.   Nuclear Pre-Procedure Cardiac Risk Factors: Carotid Disease, History of Smoking, Hypertension, Lipids, PVD Caffeine/Decaff Intake: none NPO After: 6:30 AM Lungs: Clear.  O2 Sat 98% on RA. IV 0.9% NS with Angio Cath: 18g     IV Site: (R) AC IV Started by: Eliezer Lofts EMT-P Chest Size (in) 38     Cup Size C     Height (in): 67.5 Weight (lb): 174 BMI: 26.95  Nuclear Med Study 1 or 2 day study:  1 day     Stress Test Type:  Adenosine Reading MD:  Kirk Ruths, MD     Referring MD:  Glori Bickers, MD Resting Radionuclide:  Technetium 82m Tetrofosmin     Resting Radionuclide Dose:  11 mCi  Stress Radionuclide:  Technetium 72m Tetrofosmin     Stress Radionuclide Dose:  33 mCi   Stress Protocol  Dose of Adenosine:  44.3 mg    Stress Test Technologist:  Valetta Fuller CMA-N     Nuclear Technologist:  Charlton Amor CNMT  Rest Procedure  Myocardial perfusion imaging was performed at rest 45 minutes following the intravenous administration of Myoview Technetium 30m Tetrofosmin.  Stress Procedure  The patient received IV adenosine at 140 mcg/kg/min for 4 minutes. There were no significant changes with infusion. She did c/o chest tightness with infusion.  Myoview was injected at the 2 minute mark and quantitative spect images were obtained after a 45 minute delay.  QPS Raw Data Images:  Acuisition  technically good; normal left ventricular size. Stress Images:  There is decreased uptake in the apex. Rest Images:  There is decreased uptake in the apex. Subtraction (SDS):  No evidence of ischemia. Transient Ischemic Dilatation:  1.01  (Normal <1.22)  Lung/Heart Ratio:  .36  (Normal <0.45)  Quantitative Gated Spect Images QGS EDV:  48 ml QGS ESV:  9 ml QGS EF:  82 % QGS cine images:  Normal wall motion.   Overall Impression  Exercise Capacity: Adenosine study with no exercise. ECG Impression: Uninterpretable due to ventricular pacing. Overall Impression: There is mild apical thinning but no sign of scar or ischemia.  Appended Document: Cardiology Nuclear Study pt aware.  Gabriel Cirri RN BSN

## 2010-06-05 NOTE — Assessment & Plan Note (Signed)
Summary: F/U 6 MONTHS;  Medications Added FUROSEMIDE 20 MG TABS (FUROSEMIDE) Take one tablet by mouth daily as needed KLOR-CON M20 20 MEQ CR-TABS (POTASSIUM CHLORIDE CRYS CR) 1 tab by mouth daily with furosemide      Allergies Added:   Visit Type:  Follow-up Primary Provider:  [name] MD  CC:  chest pains, always little shortness of breath, and little edema.  History of Present Illness: Ms. Rudden is a delightful 77 year old woman with a history of hypertrophic cardiomyopathy complicated by syncope, as well as symptomatic bradycardia.  She is status post ICD.  She also has a history of nonobstructive coronary artery disease with chronic chest pain, bilateral carotid artery stenosis, paroxysmal atrial fibrillation, renal cell carcinoma status post resection of her right kidney with residual chronic renal insufficiency, and COPD.   Returns today for routine f/u. Feels she is doing pretty well. Not walking as much due to weather. About to start-up again. Mild chronic dyspnea. Over past 4 days has had more CP feels better today. Mild ankle edema better at night. No orthopnea or PND. No syncope. Occasional palpitations. Brief episodes but more frequent. Occasional dizzy but no syncope.  Having fun with friends going out and eating a lot. Very involved with church and great grandkids. Gained 7 pounds. Had stress test scheduled previously but cancelled.  Current Medications (verified): 1)  Disopyramide Phosphate 150 Mg Caps (Disopyramide Phosphate) .Marland Kitchen.. 1 Cap Two Times A Day 2)  Carvedilol 12.5 Mg Tabs (Carvedilol) .Marland Kitchen.. 1 Tab Two Times A Day 3)  Warfarin Sodium 5 Mg Tabs (Warfarin Sodium) .... Use As Directed By Anticoagulation Clinic 4)  Aspirin 81 Mg Tbec (Aspirin) .... Take One Tablet By Mouth Daily 5)  Hydrochlorothiazide 25 Mg Tabs (Hydrochlorothiazide) .... Take One Tablet By Mouth Daily. 6)  Simvastatin 80 Mg Tabs (Simvastatin) .... Take One Tablet By Mouth Daily At Bedtime 7)   Verapamil Hcl Cr 180 Mg Xr24h-Cap (Verapamil Hcl) .... Take One Capsule By Mouth Daily 8)  Alprazolam 0.25 Mg Tabs (Alprazolam) .Marland Kitchen.. 1 Tab Every Night As Needed For Anxiety.  Allergies (verified): 1)  ! Codeine 2)  ! Sulfa  Past History:  Past Medical History: RENAL INSUFFICIENCY, CHRONIC (ICD-585.9) COPD (ICD-496) ATRIAL FIBRILLATION (ICD-427.31) CAROTID ARTERY STENOSIS (ICD-433.10)    --60-79% bilat (stable) 5/09    --40-59% R 60-79% 6/10 CHEST PAIN-UNSPECIFIED (ICD-786.50) ICD - IN SITU (ICD-V45.02) SINUS BRADYCARDIA (ICD-427.81) HYPOTENSION (ICD-458.9) Cardiac cath 2006    --non-obstructive CAD 30-40s lesions   --ETT 1/09 nondiagnostic due to poor HR response    Review of Systems       As per HPI and past medical history; otherwise all systems negative.   Vital Signs:  Patient profile:   77 year old female Height:      66.5 inches Weight:      175.50 pounds BMI:     28.00 Pulse rate:   77 / minute Pulse rhythm:   regular BP sitting:   140 / 86  (left arm) Cuff size:   regular  Vitals Entered By: Philemon Kingdom (July 14, 2009 12:07 PM)  Physical Exam  General:  General:  Gen: well appearing. no resp difficulty HEENT: normal Neck: supple. no JVD. Carotids 2+ bilat; + soft bruits. No lymphadenopathy or thryomegaly appreciated. Cor: PMI nondisplaced. Regular rate & rhythm. No rubs, gallops. very soft SEM at rusb. not worse with valsalva. +s4 Lungs: clear Abdomen: soft, nontender, nondistended.Good bowel sounds. Extremities: no cyanosis, clubbing, rash. tr edema Neuro: alert &  orientedx3, cranial nerves grossly intact. moves all 4 extremities w/o difficulty. affect pleasant      ICD Specifications Following MD:  Cristopher Peru, MD     ICD Vendor:  Med[employer]onic     ICD Model Number:  [id number]     ICD Serial Number:  [id number] ICD DOI:  02/28/2005     ICD Implanting MD:  Cristopher Peru, MD  Lead 1:    Location: RA     DOI: 02/28/2005     Model #: [id number]     Serial  #: [id number]     Status: active Lead 2:    Location: RV     DOI: 02/28/2005     Model #: [id number]     Serial #: [id number]     Status: active  Indications::  HOCM WITH SYNCOPE   ICD Follow Up ICD Dependent:  No      Episodes Coumadin:  Yes  Brady Parameters Mode DDDR     Lower Rate Limit:  60     Upper Rate Limit 140 PAV 150     Sensed AV Delay:  120  Tachy Zones VF:  200     VT:  250     VT1:  171     Impression & Recommendations:  Problem # 1:  HOCM / IHSS (ICD-425.1) Sable. Doing well on disopyramide. Due for repeat echo. No gradient on lst echo.  Problem # 2:  CHEST PAIN-UNSPECIFIED (ICD-786.50) This is chronic. Last cath 2006 no signifcant CAD. Unable to tolerate treadmill stress test in past. Will schedule Lexiscan Myoview.  Problem # 3:  ATRIAL FIBRILLATION (ICD-427.31) Paroxysmal. Tolerating coumadin well. No change.  Problem # 4:  EDEMA (ICD-782.3) Will give her lasix 20/kcl 20 to use as needed.   Other Orders: Nuclear Stress Test (Nuc Stress Test) Echocardiogram (Echo)  Patient Instructions: 1)  Your physician recommends that you schedule a follow-up appointment in: 6 months 2)  Your physician has recommended you make the following change in your medication: start lasix 20 mg/(klor con) 45mEq daily as needed 3)  Your physician has requested that you have an echocardiogram.  Echocardiography is a painless test that uses sound waves to create images of your heart. It provides your doctor with information about the size and shape of your heart and how well your heart's chambers and valves are working.  This procedure takes approximately one hour. There are no restrictions for this procedure. 4)  Your physician has requested that you have an Hiller.  For further information please visit [email or web address].  Please follow instruction sheet, as given. Prescriptions: KLOR-CON M20 20 MEQ CR-TABS (POTASSIUM CHLORIDE CRYS CR) 1 tab by mouth daily with furosemide  #30  x 6   Entered by:   Gabriel Cirri, RN, BSN   Authorized by:   Jolaine Artist, MD, [facility]   Signed by:   Gabriel Cirri, RN, BSN on 07/14/2009   Method used:   Electronically to        [facility] (retail)       [address]       [place], [place]  [address]       Ph: [phone]       Fax: [phone]   [id number] FUROSEMIDE 20 MG TABS (FUROSEMIDE) Take one tablet by mouth daily as needed  #30 x 6   Entered by:   Gabriel Cirri, RN, BSN   Authorized by:   Jolaine Artist, MD, [facility]   Signed  by:   Gabriel Cirri, RN, BSN on 07/14/2009   Method used:   Electronically to        [facility] (retail)       [address]       [place], [place]  [address]       Ph: [phone]       Fax: [phone]   RxID:   [id number]   Appended Document: F/U 6 MONTHS;    Clinical Lists Changes  Orders: Added new Test order of T-Protime, Auto [phone]) - Signed

## 2010-06-05 NOTE — Letter (Signed)
Summary: [facility] Instructions  [facility]  [place], [place] [address]   Phone: [phone]  Fax: [phone]       LIZETH WEES    [birth year]    MRN: [id number]        Procedure Day /Date:  Thursday 11/02/2009     Arrival Time: 8:30 am      Procedure Time: 9:30 am     Location of Procedure:                     _x _  [facility] ( Outpatient Registration)                        [place]   Starting 5 days prior to your procedure Saturday 6/25 do not eat nuts, seeds, popcorn, corn, beans, peas,  salads, or any raw vegetables.  Do not take any fiber supplements (e.g. Metamucil, Citrucel, and Benefiber).  THE DAY BEFORE YOUR PROCEDURE         DATE: Wednesday 6/29 1.  Drink clear liquids the entire day-NO SOLID FOOD  2.  Do not drink anything colored red or purple.  Avoid juices with pulp.  No orange juice.  3.  Drink at least 64 oz. (8 glasses) of fluid/clear liquids during the day to prevent dehydration and help the prep work efficiently.  CLEAR LIQUIDS INCLUDE: Water Jello Ice Popsicles Tea (sugar ok, no milk/cream) Powdered fruit flavored drinks Coffee (sugar ok, no milk/cream) Gatorade Juice: apple, white grape, white cranberry  Lemonade Clear bullion, consomm, broth Carbonated beverages (any kind) Strained chicken noodle soup Hard Candy                             4.  In the morning, mix first dose of MoviPrep solution:    Empty 1 Pouch A and 1 Pouch B into the disposable container    Add lukewarm drinking water to the top line of the container. Mix to dissolve    Refrigerate (mixed solution should be used within 24 hrs)  5.  Begin drinking the prep at 5:00 p.m. The MoviPrep container is divided by 4 marks.   Every 15 minutes drink the solution down to the next mark (approximately 8 oz) until the full liter is complete.   6.  Follow completed prep with 16 oz of clear liquid of your  choice (Nothing red or purple).  Continue to drink clear liquids until bedtime.  7.  Before going to bed, mix second dose of MoviPrep solution:    Empty 1 Pouch A and 1 Pouch B into the disposable container    Add lukewarm drinking water to the top line of the container. Mix to dissolve    Refrigerate  THE DAY OF YOUR PROCEDURE      DATE: Thursday 6/30  Beginning at 4:30 a.m. (5 hours before procedure):         1. Every 15 minutes, drink the solution down to the next mark (approx 8 oz) until the full liter is complete.  2. Follow completed prep with 16 oz. of clear liquid of your choice.    3. You may drink clear liquids until 5:30 am (4 HOURS BEFORE PROCEDURE).   MEDICATION INSTRUCTIONS  Unless otherwise instructed, you should take regular prescription medications with a small sip of water   as early as possible the morning  of your procedure.     Stop taking Coumadin on  _6/25/11   Saturday  (5 days before procedure).  Additional medication instructions:   Hold HCTZ until after your procedure.         OTHER INSTRUCTIONS  You will need a responsible adult at least 75 years of age to accompany you and drive you home.   This person must remain in the waiting room during your procedure.  Wear loose fitting clothing that is easily removed.  Leave jewelry and other valuables at home.  However, you may wish to bring a book to read or  an iPod/MP3 player to listen to music as you wait for your procedure to start.  Remove all body piercing jewelry and leave at home.  Total time from sign-in until discharge is approximately 2-3 hours.  You should go home directly after your procedure and rest.  You can resume normal activities the  day after your procedure.  The day of your procedure you should not:   Drive   Make legal decisions   Operate machinery   Drink alcohol   Return to work  You will receive specific instructions about eating, activities and  medications before you leave.    The above instructions have been reviewed and explained to me by   Ernestine Conrad, RN______________________    I fully understand and can verbalize these instructions _____________________________ Date _________

## 2010-06-05 NOTE — Procedures (Signed)
Summary: HOLD letter/[facility]  HOLD letter/[facility]   Imported By: Bubba Hales 10/25/2009 07:29:39  _____________________________________________________________________  External Attachment:    Type:   Image     Comment:   External Document

## 2010-06-05 NOTE — Assessment & Plan Note (Signed)
Summary: FEELING LETHARGIC FOR LAST 3 WEEKS / LFW   Vital Signs:  Patient profile:   77 year old female Height:      67.5 inches Weight:      167.13 pounds BMI:     25.88 Temp:     98.3 degrees F oral Pulse rate:   84 / minute Pulse rhythm:   regular BP sitting:   110 / 80  (left arm) Cuff size:   regular  Vitals Entered By: Sherrian Divers CMA Deborra Medina) (November 16, 2009 12:03 PM) CC: feeling very weak lately, "not herself"   History of Present Illness: 77 yo here for weakness since her colonscopy on 6/30. Colonscopy was essentially normal, mild diverticulosis. Has not had any recurrent rectal bleeding. No CP, abdominal pain or SOB.  Did have some dysuria this morning. No fevers or chills. No n/v. Has had a dry cough for about a week.  Has felt so tired and weak that her appetite has decreased.  Weight is down 7 pounds since 6/9.  Current Medications (verified): 1)  Disopyramide Phosphate 150 Mg Caps (Disopyramide Phosphate) .Marland Kitchen.. 1 Cap Two Times A Day 2)  Carvedilol 12.5 Mg Tabs (Carvedilol) .Marland Kitchen.. 1 Tab Two Times A Day 3)  Warfarin Sodium 5 Mg Tabs (Warfarin Sodium) .... Use As Directed By Anticoagulation Clinic 4)  Aspirin 81 Mg Tbec (Aspirin) .... Take One Tablet By Mouth Daily 5)  Hydrochlorothiazide 25 Mg Tabs (Hydrochlorothiazide) .... Take One Tablet By Mouth Daily. 6)  Simvastatin 80 Mg Tabs (Simvastatin) .... Take One Tablet By Mouth Daily At Bedtime 7)  Verapamil Hcl Cr 180 Mg Xr24h-Cap (Verapamil Hcl) .... Take One Capsule By Mouth Daily 8)  Alprazolam 0.25 Mg Tabs (Alprazolam) .Marland Kitchen.. 1 Tab Every Night As Needed For Anxiety. 9)  Furosemide 20 Mg Tabs (Furosemide) .... Take One Tablet By Mouth Daily As Needed 10)  Klor-Con M20 20 Meq Cr-Tabs (Potassium Chloride Crys Cr) .Marland Kitchen.. 1 Tab By Mouth Daily With Furosemide 11)  Cipro 500 Mg Tabs (Ciprofloxacin Hcl) .Marland Kitchen.. 1 By Mouth 2 Times Daily X 7 Days  Allergies: 1)  ! Codeine 2)  ! Sulfa  Past History:  Past Medical  History: Last updated: 10/13/2009 RENAL INSUFFICIENCY, CHRONIC (ICD-585.9) COPD (ICD-496) ATRIAL FIBRILLATION (ICD-427.31) CAROTID ARTERY STENOSIS (ICD-433.10)    --60-79% bilat (stable) 5/09    --40-59% R 60-79% 6/10 CHEST PAIN-UNSPECIFIED (ICD-786.50) ICD - IN SITU (ICD-V45.02) SINUS BRADYCARDIA (ICD-427.81) HYPOTENSION (ICD-458.9) Cardiac cath 2006    --non-obstructive CAD 30-40s lesions   --ETT 1/09 nondiagnostic due to poor HR response Right Renal cancer 2003    Past Surgical History: Last updated: 04/10/2009 ICD - Medtronic maximal DR - 02/28/05 Hysterectomy 1975 for benign causes Cataract extraction Nephrectomy right s/p renal cell CA  Family History: Last updated: 10/13/2009 No FH of Colon Cancer:  Social History: Last updated: 04/10/2009 Retired  Tobacco Use - Former. 1/2 pack x 40 yrs till 2003 Alcohol Use - no Widow/Widower Children and grandchildren all live close by but she lives alone. Very active in her church, has group of 4 best friends, self titled "the [employer] Gem[employer] Girls!"  Risk Factors: Smoking Status: quit (10/27/2008)  Review of Systems      See HPI General:  Complains of fatigue and malaise; denies fever. CV:  Denies chest pain or discomfort. Resp:  Denies shortness of breath. GI:  Denies abdominal pain and change in bowel habits. MS:  Denies joint pain, joint redness, and joint swelling.  Physical Exam  General:  Gen:  well appearing.   Mouth:  Oral mucosa and oropharynx without lesions or exudates.  Teeth in good repair. Lungs:  Normal respiratory effort, chest expands symmetrically. Lungs are clear to auscultation, no crackles or wheezes. Heart:  no murmurs, RRR. Abdomen:  Bowel sounds positive,abdomen soft and non-tender without masses, organomegaly or hernias noted. Extremities:  trace edema bilaterally. Psych:  Cognition and judgment appear intact. Alert and cooperative with normal attention span and concentration. No apparent delusions,  illusions, hallucinations   Impression & Recommendations:  Problem # 1:  FATIGUE (ICD-780.79) Assessment New Etiology unknown but unlikely due to colonoscopy as Mr. Hutmacher is worried about. UA pos for LE, will treat for UTI with 7 day course of cipro. Will also check labs, CBC, BMET, TSH to rule out other causes. Send urine for culture. Abdomen soft on exam, lungs clear. If symptoms persist, follow up with me next week.  Orders: Venipuncture [id number]) TLB-CBC Platelet - w/Differential (85025-CBCD) TLB-BMP (Basic Metabolic Panel-BMET) (99991111) TLB-TSH (Thyroid Stimulating Hormone) (84443-TSH)  Complete Medication List: 1)  Disopyramide Phosphate 150 Mg Caps (Disopyramide phosphate) .Marland Kitchen.. 1 cap two times a day 2)  Carvedilol 12.5 Mg Tabs (Carvedilol) .Marland Kitchen.. 1 tab two times a day 3)  Warfarin Sodium 5 Mg Tabs (Warfarin sodium) .... Use as directed by anticoagulation clinic 4)  Aspirin 81 Mg Tbec (Aspirin) .... Take one tablet by mouth daily 5)  Hydrochlorothiazide 25 Mg Tabs (Hydrochlorothiazide) .... Take one tablet by mouth daily. 6)  Simvastatin 80 Mg Tabs (Simvastatin) .... Take one tablet by mouth daily at bedtime 7)  Verapamil Hcl Cr 180 Mg Xr24h-cap (Verapamil hcl) .... Take one capsule by mouth daily 8)  Alprazolam 0.25 Mg Tabs (Alprazolam) .Marland Kitchen.. 1 tab every night as needed for anxiety. 9)  Furosemide 20 Mg Tabs (Furosemide) .... Take one tablet by mouth daily as needed 10)  Klor-con M20 20 Meq Cr-tabs (Potassium chloride crys cr) .Marland Kitchen.. 1 tab by mouth daily with furosemide 11)  Cipro 500 Mg Tabs (Ciprofloxacin hcl) .Marland Kitchen.. 1 by mouth 2 times daily x 7 days  Other Orders: T-Culture, Urine [id number]) UA Dipstick w/o Micro (manual) (81002) Specimen Handling (99000) Prescriptions: CIPRO 500 MG TABS (CIPROFLOXACIN HCL) 1 by mouth 2 times daily x 7 days  #14 x 0   Entered and Authorized by:   [name] MD   Signed by:   [name] MD on 11/16/2009   Method used:    Electronically to        [facility] (retail)       [address]       [place], [place]  [address]       Ph: [phone]       Fax: [phone]   RxID:   [id number]   Current Allergies (reviewed today): ! CODEINE ! SULFA  Laboratory Results   Urine Tests  Date/Time Received: November 16, 2009 12:13 PM   Routine Urinalysis   Color: yellow Appearance: Cloudy Glucose: negative   (Normal Range: Negative) Bilirubin: negative   (Normal Range: Negative) Ketone: negative   (Normal Range: Negative) Spec. Gravity: 1.015   (Normal Range: 1.003-1.035) Blood: large   (Normal Range: Negative) pH: 6.5   (Normal Range: 5.0-8.0) Protein: 30   (Normal Range: Negative) Urobilinogen: 0.2   (Normal Range: 0-1) Nitrite: negative   (Normal Range: Negative) Leukocyte Esterace: moderate   (Normal Range: Negative)

## 2010-06-05 NOTE — Progress Notes (Signed)
Summary: wants more abx  Phone Note Call from Patient Call back at [phone]   Caller: Patient Call For: [name] MD Summary of Call: Pt was seen on 11/10 and given a z-pack for a sinus infection.  She said this helped and sxs improved but nothing went completely away.  She still has sinus congestion, scratchy throat, sinus pressure.  No fever.  She is asking if she should have more abx.  Uses [facility]. Initial call taken by: Marty Heck CMA, AAMA,  April 02, 2010 2:53 PM    New/Updated Medications: AUGMENTIN 875-125 MG TABS (AMOXICILLIN-POT CLAVULANATE) 1 by mouth 2 times daily x 10 days Prescriptions: AUGMENTIN 875-125 MG TABS (AMOXICILLIN-POT CLAVULANATE) 1 by mouth 2 times daily x 10 days  #20 x 0   Entered and Authorized by:   [name] MD   Signed by:   [name] MD on 04/02/2010   Method used:   Electronically to        [facility] (retail)       [address]       [place], [place]  [address]       Ph: [phone]       Fax: [phone]   RxID:   [id number]

## 2010-06-05 NOTE — Medication Information (Signed)
Summary: CCR/AMD   Anticoagulant Therapy  Managed by: Gabriel Cirri, RN, BSN Referring MD: Glori Bickers Supervising MD: Haroldine Laws MD, Quillian Quince Indication 1: Atrial Fibrillation (ICD-427.31) Lab Used: [facility] Site: [facility] 23.6 INR RANGE 2.0-3.0  Dietary changes: no    Health status changes: no    Bleeding/hemorrhagic complications: no    Recent/future hospitalizations: no    Any changes in medication regimen? no    Recent/future dental: no  Any missed doses?: no       Is patient compliant with meds? yes       Allergies: 1)  ! Codeine 2)  ! Sulfa  Anticoagulation Management History:      Her anticoagulation is being managed by telephone today.  Positive risk factors for bleeding include an age of 75 years or older.  The bleeding index is 'intermediate risk'.  Negative CHADS2 values include Age > 11 years old.  The start date was 12/17/2005.  Her last INR was 1.93 and today's INR is 2.12.  Prothrombin time is 23.6.  Anticoagulation responsible provider: Rosell Khouri MD, Quillian Quince.    Anticoagulation Management Assessment/Plan:      The patient's current anticoagulation dose is Warfarin sodium 5 mg tabs: Use as directed by Anticoagulation Clinic.  The target INR is 2.0-3.0.  The next INR is due 05/23/2009.  Anticoagulation instructions were given to patient.  Results were reviewed/authorized by Gabriel Cirri, RN, BSN.  She was notified by Gabriel Cirri, RN, BSN.         Prior Anticoagulation Instructions: coumadin 5mg  today then resume coumadin 2.5 mg daily.   Current Anticoagulation Instructions: The patient is to continue with the same dose of coumadin.  This dosage includes: coumadin 2.5 mg daily

## 2010-06-05 NOTE — Progress Notes (Signed)
Summary: Alprazolam  Phone Note Refill Request Message from:  Fax from Pharmacy on October 30, 2009 11:14 AM  Refills Requested: Medication #1:  ALPRAZOLAM 0.25 MG TABS 1 tab every night as needed for anxiety. [place]  Phone:   [phone]   Method Requested: Telephone to Pharmacy Initial call taken by: Christena Deem CMA Deborra Medina),  October 30, 2009 11:15 AM  Follow-up for Phone Call        Called to [facility]. Follow-up by: Marty Heck CMA,  October 30, 2009 11:41 AM    Prescriptions: ALPRAZOLAM 0.25 MG TABS (ALPRAZOLAM) 1 tab every night as needed for anxiety.  #30 x 0   Entered and Authorized by:   [name] MD   Signed by:   [name] MD on 10/30/2009   Method used:   Telephoned to ...       [facility] (retail)       [address]       [place], [place]  [address]       Ph: [phone]       Fax: [phone]   RxID:   [id number]

## 2010-06-05 NOTE — Assessment & Plan Note (Signed)
Summary: SINUS/JRR   Vital Signs:  Patient profile:   77 year old female Height:      67.5 inches Weight:      162 pounds BMI:     25.09 Temp:     97.7 degrees F oral Pulse rate:   72 / minute Pulse rhythm:   regular BP sitting:   122 / 70  (left arm) Cuff size:   regular  Vitals Entered By: Sherrian Divers CMA Deborra Medina) (March 15, 2010 2:39 PM) CC: congestion   History of Present Illness: 77 yo here for ?sinus infection.  Runny nose, sneezing started 2 weeks ago.  Started to get better until a few days ago, symptoms became abruptly worse. Now has sinus headaches, sore throat, productive cough. Body aches, no fevers. Had chills last night. No CP or SOB. Taking OTC decongestant.  PMH- on coumadin.  Current Medications (verified): 1)  Disopyramide Phosphate 150 Mg Caps (Disopyramide Phosphate) .Marland Kitchen.. 1 Cap Two Times A Day 2)  Carvedilol 12.5 Mg Tabs (Carvedilol) .Marland Kitchen.. 1 Tab Two Times A Day 3)  Warfarin Sodium 5 Mg Tabs (Warfarin Sodium) .... Use As Directed By Anticoagulation Clinic 4)  Aspirin 81 Mg Tbec (Aspirin) .... Take One Tablet By Mouth Daily 5)  Hydrochlorothiazide 25 Mg Tabs (Hydrochlorothiazide) .... Take One Tablet By Mouth Daily. 6)  Lipitor 40 Mg Tabs (Atorvastatin Calcium) .... Take One Tablet By Mouth Daily. 7)  Verapamil Hcl Cr 180 Mg Xr24h-Cap (Verapamil Hcl) .... Take One Capsule By Mouth Daily 8)  Alprazolam 0.25 Mg Tabs (Alprazolam) .Marland Kitchen.. 1 Tab Every Night As Needed For Anxiety. 9)  Furosemide 20 Mg Tabs (Furosemide) .... Take One Tablet By Mouth Daily As Needed 10)  Klor-Con M20 20 Meq Cr-Tabs (Potassium Chloride Crys Cr) .Marland Kitchen.. 1 Tab By Mouth Daily With Furosemide 11)  Azithromycin 250 Mg  Tabs (Azithromycin) .... 2 By  Mouth Today and Then 1 Daily For 4 Days  Allergies: 1)  ! Codeine 2)  ! Sulfa  Past History:  Past Medical History: Last updated: 10/13/2009 RENAL INSUFFICIENCY, CHRONIC (ICD-585.9) COPD (ICD-496) ATRIAL FIBRILLATION  (ICD-427.31) CAROTID ARTERY STENOSIS (ICD-433.10)    --60-79% bilat (stable) 5/09    --40-59% R 60-79% 6/10 CHEST PAIN-UNSPECIFIED (ICD-786.50) ICD - IN SITU (ICD-V45.02) SINUS BRADYCARDIA (ICD-427.81) HYPOTENSION (ICD-458.9) Cardiac cath 2006    --non-obstructive CAD 30-40s lesions   --ETT 1/09 nondiagnostic due to poor HR response Right Renal cancer 2003    Past Surgical History: Last updated: 04/10/2009 ICD - Medtronic maximal DR - 02/28/05 Hysterectomy 1975 for benign causes Cataract extraction Nephrectomy right s/p renal cell CA  Family History: Last updated: 10/13/2009 No FH of Colon Cancer:  Social History: Last updated: 04/10/2009 Retired  Tobacco Use - Former. 1/2 pack x 40 yrs till 2003 Alcohol Use - no Widow/Widower Children and grandchildren all live close by but she lives alone. Very active in her church, has group of 4 best friends, self titled "the [employer] Gem[employer] Girls!"  Risk Factors: Smoking Status: quit (10/27/2008)  Review of Systems      See HPI General:  Denies fever. ENT:  Complains of hoarseness, postnasal drainage, sinus pressure, and sore throat. CV:  Denies chest pain or discomfort.  Physical Exam  General:  Gen: well appearing, congestion. VSS, nontoxic appearing. Nose:  boggy swollen turbinates. Sinuses TTP throughtou. Mouth:  pharyngeal erythema, no exudates. Lungs:  Normal respiratory effort, chest expands symmetrically. Lungs are clear to auscultation, no crackles or wheezes. Heart:  no murmurs, RRR. Cervical Nodes:  No lymphadenopathy noted Psych:  Cognition and judgment appear intact. Alert and cooperative with normal attention span and concentration. No apparent delusions, illusions, hallucinations   Impression & Recommendations:  Problem # 1:  OTHER ACUTE SINUSITIS (ICD-461.8) Assessment New Given duration and progression of symptoms, will treat for bacterial sinusitis with Zpack. See pt instructions for details. Her updated  medication list for this problem includes:    Azithromycin 250 Mg Tabs (Azithromycin) .Marland Kitchen... 2 by  mouth today and then 1 daily for 4 days  Complete Medication List: 1)  Disopyramide Phosphate 150 Mg Caps (Disopyramide phosphate) .Marland Kitchen.. 1 cap two times a day 2)  Carvedilol 12.5 Mg Tabs (Carvedilol) .Marland Kitchen.. 1 tab two times a day 3)  Warfarin Sodium 5 Mg Tabs (Warfarin sodium) .... Use as directed by anticoagulation clinic 4)  Aspirin 81 Mg Tbec (Aspirin) .... Take one tablet by mouth daily 5)  Hydrochlorothiazide 25 Mg Tabs (Hydrochlorothiazide) .... Take one tablet by mouth daily. 6)  Lipitor 40 Mg Tabs (Atorvastatin calcium) .... Take one tablet by mouth daily. 7)  Verapamil Hcl Cr 180 Mg Xr24h-cap (Verapamil hcl) .... Take one capsule by mouth daily 8)  Alprazolam 0.25 Mg Tabs (Alprazolam) .Marland Kitchen.. 1 tab every night as needed for anxiety. 9)  Furosemide 20 Mg Tabs (Furosemide) .... Take one tablet by mouth daily as needed 10)  Klor-con M20 20 Meq Cr-tabs (Potassium chloride crys cr) .Marland Kitchen.. 1 tab by mouth daily with furosemide 11)  Azithromycin 250 Mg Tabs (Azithromycin) .... 2 by  mouth today and then 1 daily for 4 days  Patient Instructions: 1)  Take Zpack as directed.  Drink lots of fluids.  Treat sympotmatically with Mucinex, nasal saline irrigation, and Tylenol/Ibuprofen.  You can use warm compresses.  Cough suppressant at night. Call if not improving as expected in 5-7 days.  Prescriptions: AZITHROMYCIN 250 MG  TABS (AZITHROMYCIN) 2 by  mouth today and then 1 daily for 4 days  #6 x 0   Entered and Authorized by:   [name] MD   Signed by:   [name] MD on 03/15/2010   Method used:   Electronically to        [facility] (retail)       [address]       [place], [place]  [address]       Ph: [phone]       Fax: [phone]   RxID:   [id number]    Orders Added: 1)  Est. Patient Level III [id number]    Current Allergies (reviewed today): ! CODEINE ! SULFA

## 2010-06-05 NOTE — Medication Information (Signed)
Summary: CCR/NE  Anticoagulant Therapy  Managed by: Freddrick March, RN, BSN Referring MD: Glori Bickers, MD PCP: [name] MD Supervising MD: Rockey Situ Indication 1: Atrial Fibrillation (ICD-427.31) Lab Used: [facility] Site: [place] INR POC 3.5 INR RANGE 2.0-3.0  Dietary changes: yes       Details: Decr appetite, lost some weight.   Health status changes: yes       Details: Pt reports multiple infections UTI and URI since last visit.    Bleeding/hemorrhagic complications: no    Recent/future hospitalizations: no    Any changes in medication regimen? yes       Details: Couple abx since last visit to treat infections.   Recent/future dental: no  Any missed doses?: yes     Details: Off Coumadin prior to colonoscopy on June 30.   Is patient compliant with meds? yes      Comments: Pt last seen in June.   Allergies: 1)  ! Codeine 2)  ! Sulfa  Anticoagulation Management History:      The patient is taking warfarin and comes in today for a routine follow up visit.  Positive risk factors for bleeding include an age of 3 years or older and history of GI bleeding.  The bleeding index is 'intermediate risk'.  Negative CHADS2 values include Age > 95 years old.  The start date was 12/17/2005.  Her last INR was 2.01.  Anticoagulation responsible provider: gollan.  INR POC: 3.5.  Cuvette Lot#: [id number].  Exp: 02/2011.    Anticoagulation Management Assessment/Plan:      The patient's current anticoagulation dose is Warfarin sodium 5 mg tabs: Use as directed by Anticoagulation Clinic.  The target INR is 2.0-3.0.  The next INR is due 12/27/2009.  Anticoagulation instructions were given to patient.  Results were reviewed/authorized by Freddrick March, RN, BSN.  She was notified by Freddrick March RN.         Prior Anticoagulation Instructions: INR 4.1  Skip today's dosage of coumadin, then resume same dosage 1/2 tablet daily.  Recheck in 2 weeks.     Current Anticoagulation Instructions: INR 3.5  Skip today's dosage of Coumadin, then resume same dosage 2.5mg  daily.  Recheck in 3 weeks.

## 2010-06-05 NOTE — Letter (Signed)
Summary: [facility] Delinquent Coumadin [place], [place]  [id number] [place]   [place], [place] [address]   Phone: [phone]  Fax: [phone]     January 30, 2009 MRN: [id number]   TESIA BOSTON [address] [place], [place]  [address]   Dear Ms. Dewald,  This letter is being sent to you as a reminder that it is necessary for you to get your INR/PT checked regularly so that we can optimize your care.  Our records indicate that you were scheduled to have a test done recently.  As of today, we have not received the results of this test.  It is very important that you have your INR checked.  Please call our office at the number listed above to schedule an appointment at your earliest convenience.    If you have recently had your protime checked or have discontinued this medication, please contact our office at the above phone number to clarify this issue.  Thank you for this prompt attention to this important health care matter.  Sincerely,    [facility]

## 2010-06-05 NOTE — Progress Notes (Signed)
Summary: Rx Alprazolam  Phone Note Refill Request Call back at [phone] Message from:  [place] on March 16, 2010 3:30 PM  Refills Requested: Medication #1:  ALPRAZOLAM 0.25 MG TABS 1 tab every night as needed for anxiety.   Last Refilled: 01/23/2010 Received faxed refill request please advise.   Method Requested: Telephone to Pharmacy Initial call taken by: Sherrian Divers CMA Deborra Medina),  March 16, 2010 3:34 PM  Follow-up for Phone Call        Rx called to pharmacy Follow-up by: Sherrian Divers CMA Deborra Medina),  March 16, 2010 3:54 PM    Prescriptions: ALPRAZOLAM 0.25 MG TABS (ALPRAZOLAM) 1 tab every night as needed for anxiety.  #30 x 0   Entered and Authorized by:   [name] MD   Signed by:   [name] MD on 03/16/2010   Method used:   Telephoned to ...       [facility] (retail)       [address]       [place], [place]  [address]       Ph: [phone]       Fax: [phone]   RxID:   [id number]

## 2010-06-05 NOTE — Op Note (Addendum)
  [name], [name]             ACCOUNT NO.:  [id number]  MEDICAL RECORD NO.:  [id number]          PATIENT TYPE:  INP  LOCATION:  3740                         FACILITY:  [facility]  PHYSICIAN:  Tavia Stave C. Lorin Mercy, M.D.    DATE OF BIRTH:  December 05, 1933  DATE OF PROCEDURE:  06/04/2010 DATE OF DISCHARGE:                              OPERATIVE REPORT   PREOPERATIVE DIAGNOSIS:  Minimally displaced right femoral neck fracture, minimal impaction.  POSTOPERATIVE DIAGNOSIS:  Minimally displaced right femoral neck fracture, minimal impaction.  PROCEDURE:  Cannulated screw fixation right femoral neck, Synthes 7.3 cannulated screws x3.  SURGEON:  Sladen Plancarte C. Lorin Mercy, MD  ANESTHESIA:  GOT plus 10 mL Marcaine and local.  ESTIMATED BLOOD LOSS:  50 mL.  PROCEDURE:  After induction of general anesthesia, the patient was placed on the fracture table with care taken for just internal rotation for lateralization of the trochanter.  C-arm was taken.  Spot picture which showed anatomic position.  She did not appear to have any varus or valgus.  After standard prepping, the area was squared with towels. Large shower curtain, Steri-Drape was applied and surgical time-out procedure was completed.  C-arm was brought in and the skin was marked overlying AP with the direction of the neck lateral showed that the hip was perpendicular to the post.  A 1-inch incision was made down to the tensor fascia and an initial pin was placed center-center of the neck. Second pin was placed more superior, parallel using the guide and then the third pin was placed slightly posterior more in the neck and parallel to the top pin.  AP and lateral fluoroscopy was used.  Lateral cortex was drilled and the screws were inserted one by one after measuring appropriate lengths and lengths were 80, 85, and 90.  All screws sat down against the cortex, across the fracture site and was in the subchondral bone.  After irrigation, the leg was  taken out of the holder, hip was flexed, internally and externally rotated.  Checking under fluoroscopy direct visualization showing that the screws did not penetrate the joint and with testing closer to the joint and then back away from the joint with range of motion confirming that there was no violation.  Tensor fascia closed with 0 Vicryl, 2-0 Vicryl subcutaneous tissue and then skin staple closure with Marcaine infiltration in the skin.  Some of the head of the screw sites for postoperative analgesia, a total of 10 mL and then postop dressing.  Instrument count and needle count was correct.  The patient tolerated the procedure well.  She had a pacemaker defibrillator, but no cautery or bipolar was necessary.    Elric Tirado C. Lorin Mercy, M.D.    MCY/MEDQ  D:  06/04/2010  T:  06/05/2010  Job:  [id number]  Electronically Signed by Rodell Perna M.D. on 06/05/2010 01:07:21 PM

## 2010-06-05 NOTE — Progress Notes (Signed)
Summary: Nuclear Pre-Procedure  Phone Note Outgoing Call Call back at [facility] Phone [phone]   Call placed by: Eliezer Lofts, EMT-P,  August 02, 2009 3:03 PM Summary of Call: Unable to leave message with information on [employer] Information Sheet (see scanned document for details); No answer/ No answering machine.    Nuclear Med Background Indications for Stress Test: Evaluation for Ischemia   History: COPD, Defibrillator, Echo, GXT, Heart Catheterization  History Comments: '06 Heart Cath: N/O CAD, mild pulm. HTN '06 Defibrillator '08 Echo: EF= 65-70% '09 GXT: Non Dx, poor HR response  Symptoms: Dizziness, DOE, Palpitations    Nuclear Pre-Procedure Cardiac Risk Factors: Carotid Disease, History of Smoking, Hypertension, PVD Height (in): 66.5  Nuclear Med Study Referring MD:  Glori Bickers

## 2010-06-05 NOTE — Assessment & Plan Note (Signed)
Summary: F6M/AMD  Medications Added LIPITOR 40 MG TABS (ATORVASTATIN CALCIUM) Take one tablet by mouth daily.      Allergies Added:   Visit Type:  Follow-up Referring Provider:  [name] MD Primary Provider:  [name] MD   History of Present Illness: [name] is a delightful 77 year old woman with a history of hypertrophic cardiomyopathy complicated by syncope, as well as symptomatic bradycardia.  She is status post ICD.  She also has a history of nonobstructive coronary artery disease with chronic chest pain, bilateral carotid artery stenosis, paroxysmal atrial fibrillation, renal cell carcinoma status post resection of her right kidney with residual chronic renal insufficiency, and COPD.   Returns today for routine f/u. Feels she is doing pretty well. Walking every day. Has lost 20 pounds. Had echo and stress test in March 2011. Echo EF 55-60% no LVOT gradient.  No CP or SOB.  Fatigued at times.   Last carotid u/s  6/10; 40-59% R 60-79%. No neuro symptoms.    Current Medications (verified): 1)  Disopyramide Phosphate 150 Mg Caps (Disopyramide Phosphate) .Marland Kitchen.. 1 Cap Two Times A Day 2)  Carvedilol 12.5 Mg Tabs (Carvedilol) .Marland Kitchen.. 1 Tab Two Times A Day 3)  Warfarin Sodium 5 Mg Tabs (Warfarin Sodium) .... Use As Directed By Anticoagulation Clinic 4)  Aspirin 81 Mg Tbec (Aspirin) .... Take One Tablet By Mouth Daily 5)  Hydrochlorothiazide 25 Mg Tabs (Hydrochlorothiazide) .... Take One Tablet By Mouth Daily. 6)  Simvastatin 80 Mg Tabs (Simvastatin) .... Take One Tablet By Mouth Daily At Bedtime 7)  Verapamil Hcl Cr 180 Mg Xr24h-Cap (Verapamil Hcl) .... Take One Capsule By Mouth Daily 8)  Alprazolam 0.25 Mg Tabs (Alprazolam) .Marland Kitchen.. 1 Tab Every Night As Needed For Anxiety. 9)  Furosemide 20 Mg Tabs (Furosemide) .... Take One Tablet By Mouth Daily As Needed 10)  Klor-Con M20 20 Meq Cr-Tabs (Potassium Chloride Crys Cr) .Marland Kitchen.. 1 Tab By Mouth Daily With Furosemide  Allergies (verified): 1)   ! Codeine 2)  ! Sulfa  Past History:  Past Medical History: Last updated: 10/13/2009 RENAL INSUFFICIENCY, CHRONIC (ICD-585.9) COPD (ICD-496) ATRIAL FIBRILLATION (ICD-427.31) CAROTID ARTERY STENOSIS (ICD-433.10)    --60-79% bilat (stable) 5/09    --40-59% R 60-79% 6/10 CHEST PAIN-UNSPECIFIED (ICD-786.50) ICD - IN SITU (ICD-V45.02) SINUS BRADYCARDIA (ICD-427.81) HYPOTENSION (ICD-458.9) Cardiac cath 2006    --non-obstructive CAD 30-40s lesions   --ETT 1/09 nondiagnostic due to poor HR response Right Renal cancer 2003    Past Surgical History: Last updated: 04/10/2009 ICD - Medtronic maximal DR - 02/28/05 Hysterectomy 1975 for benign causes Cataract extraction Nephrectomy right s/p renal cell CA  Family History: Last updated: 10/13/2009 No FH of Colon Cancer:  Social History: Last updated: 04/10/2009 Retired  Tobacco Use - Former. 1/2 pack x 40 yrs till 2003 Alcohol Use - no Widow/Widower Children and grandchildren all live close by but she lives alone. Very active in her church, has group of 4 best friends, self titled "the [employer] Girls!"  Risk Factors: Smoking Status: quit (10/27/2008)  Review of Systems       As per HPI and past medical history; otherwise all systems negative.   Vital Signs:  Patient profile:   77 year old female Height:      67.5 inches Weight:      158 pounds BMI:     24.47 Pulse rate:   71 / minute BP sitting:   122 / 82  (left arm) Cuff size:   regular  Vitals Entered By:  Dolores Lory, [place] (January 19, 2010 3:48 PM)  Physical Exam  General:  well appearing. no resp difficulty HEENT: normal Neck: supple. no JVD. Carotids 2+ bilat; + soft bruits. No lymphadenopathy or thryomegaly appreciated. Cor: PMI nondisplaced. Regular rate & rhythm. No rubs, gallops. very soft SEM at rusb.  +s4 Lungs: clear Abdomen: soft, nontender, nondistended.Good bowel sounds. Extremities: no cyanosis, clubbing, rash. tr edema Neuro: alert &  orientedx3, cranial nerves grossly intact. moves all 4 extremities w/o difficulty. affect pleasant     ICD Specifications Following MD:  Cristopher Peru, MD     ICD Vendor:  Medtronic     ICD Model Number:  [id number]     ICD Serial Number:  [id number] ICD DOI:  02/28/2005     ICD Implanting MD:  Cristopher Peru, MD  Lead 1:    Location: [facility]     DOI: 02/28/2005     Model #: [id number]     Serial #: [id number]     Status: active Lead 2:    Location: [facility]     DOI: 02/28/2005     Model #: [id number]     Serial #: [id number]     Status: active  Indications::  HOCM WITH SYNCOPE   ICD Follow Up ICD Dependent:  No      Episodes Coumadin:  Yes  Brady Parameters Mode DDDR     Lower Rate Limit:  60     Upper Rate Limit 140 PAV 150     Sensed AV Delay:  120  Tachy Zones VF:  200     VT:  250     VT1:  171     Impression & Recommendations:  Problem # 1:  HOCM / IHSS (ICD-425.1) Doing well. No outflow gradient on echo. Continue current regimen.   Problem # 2:  ATRIAL FIBRILLATION (ICD-427.31) Maintaining SR. Continue coumadin.   Problem # 3:  CAROTID ARTERY STENOSIS (ICD-433.10) Asymptomatic.  Due for f/u u/s in 6 months.   Other Orders: EKG w/ Interpretation (93000) Future Orders: Carotid Duplex (Carotid Duplex) ... 07/05/2010  Patient Instructions: 1)  Your physician wants you to follow-up in:   6 months You will receive a reminder letter in the mail two months in advance. If you don't receive a letter, please call our office to schedule the follow-up appointment. 2)  Your physician has requested that you have a carotid duplex. This test is an ultrasound of the carotid arteries in your neck. It looks at blood flow through these arteries that supply the brain with blood. Allow one hour for this exam. There are no restrictions or special instructions. 3)  Your physician has recommended you make the following change in your medication:STOP simvastatin START lipitor when it goes generic  Prescriptions: LIPITOR  40 MG TABS (ATORVASTATIN CALCIUM) Take one tablet by mouth daily.  #30 x 6   Entered by:   Cordelia Pen, RN   Authorized by:   Jolaine Artist, MD, [facility]   Signed by:   Cordelia Pen, RN on 01/19/2010   Method used:   Electronically to        [facility]* (retail)       [address]       [place], [place]  [address]       Ph: [phone]       Fax: [phone]   RxID:   [id number]

## 2010-06-05 NOTE — Procedures (Signed)
Summary: Prep/[facility]  Prep/[facility]   Imported By: Bubba Hales 10/25/2009 07:30:53  _____________________________________________________________________  External Attachment:    Type:   Image     Comment:   External Document

## 2010-06-05 NOTE — Miscellaneous (Signed)
Summary: [facility] COLON  Clinical Lists Changes  Orders: Added new Test order of ZCOL (ZCOL) - Signed

## 2010-06-05 NOTE — Progress Notes (Signed)
Summary: ? Follow up appt  Phone Note Call from Patient Call back at Home Phone [phone]   Caller: Patient Call For: [name] MD Summary of Call: Patient is not scheduled for a follow up appt and wants to know when she should come back.  There was nothing in her last office visit note stating when she needs to return.  Please advise. Initial call taken by: Sherrian Divers CMA Deborra Medina),  January 23, 2010 10:18 AM  Follow-up for Phone Call        within the next 3-6 months, sooner if not feeling well. [name] MD  January 23, 2010 10:59 AM  Patient advised as instructed via telephone.  F/u appt made for 03/08/2010 at 3:45 with Dr. Deborra Medina. Follow-up by: Sherrian Divers CMA Deborra Medina),  January 23, 2010 11:09 AM

## 2010-06-05 NOTE — Assessment & Plan Note (Signed)
Summary: NEW PATIENT TO EST/MK DR Phillip Heal REF   Vital Signs:  Patient profile:   77 year old female Height:      66.5 inches Weight:      168.13 pounds BMI:     26.83 Temp:     97.6 degrees F oral Pulse rate:   76 / minute Pulse rhythm:   regular BP sitting:   136 / 72  (left arm) Cuff size:   regular  Vitals Entered By: Christena Deem CMA Deborra Medina) (April 10, 2009 2:07 PM) CC: New Patient to Establish - Ref by Dr. Haroldine Laws   History of Present Illness: Very pleasant 77 yo pt of Dr. Haroldine Laws here to establish care.  1.  Hypertrophic cardiomyopathy with bradycardia/syncope s/p ICD.  Also has nonobstructive CAD, bilateral carotid stenosis, paroxysmal afib on coumadin.  On Carvediol 12.5 mg two times a day, ASA 81 mg daily, HCTZ 25 mg daily, Verapamil 180 mg daily, and Disopyramide 150 mg two times a day.  Feels like from a cardiac standpoint, she is doing very well.  Very active.  Walks daily, if not outside than with her girlfriends in the mall.   Occasionally has CP but not worsened with activity.  No orthopnea.    2.  h/o right renal CA s/p resection in 2003.  Sees Dr. Eliberto Ivory with urology once a year.  From looking at prior centricity notes, last Cr was 1.44.  Does have occasional hematuria, but not having issues with that currently.    3.  Anxiety- feels that overall her mood is great.  Very active in her church and with her friends.  Spent last 10 years of her life caring for her ill husband and elderly mother.  Her husband died 4 years ago and her mom two years ago.  It's been an adjustment but now she finally has time for herself.  Occasionally, when she worries about paying for bills or her daughter's tough time, she has some increased anxiety.  Not tearful, no difficutly sleeping, but she just worries.  No SI, No HI.  4.  Well woman- needs mammogram, FLP, BMET, Dexa scan, and Zostavax.    5.  Urinary incontinence- G2P2, over last 6 months, increased urge incontinece.  Not  worsened with coughing, sneezing or laughing.  Has urge and sometimes can't make it to bathroom.  No dysuria.  s/p hysterectomy in 1975,  Allergies: 1)  ! Codeine 2)  ! Sulfa  Past History:  Past Surgical History: ICD - Medtronic maximal DR - 02/28/05 Hysterectomy 1975 for benign causes Cataract extraction Nephrectomy right s/p renal cell CA  Social History: Retired  Tobacco Use - Former. 1/2 pack x 40 yrs till 2003 Alcohol Use - no Widow/Widower Children and grandchildren all live close by but she lives alone. Very active in her church, has group of 4 best friends, self titled "the [employer]!"  Review of Systems      See HPI General:  Denies malaise. Eyes:  Denies blurring. ENT:  Denies difficulty swallowing. CV:  Complains of palpitations; denies chest pain or discomfort, difficulty breathing while lying down, fatigue, leg cramps with exertion, shortness of breath with exertion, swelling of feet, and swelling of hands. Resp:  Denies shortness of breath. GI:  Denies abdominal pain, bloody stools, and change in bowel habits. GU:  Complains of incontinence. Derm:  Denies rash. Neuro:  Denies weakness. Psych:  Complains of anxiety; denies depression, easily angered, easily tearful, irritability, panic attacks, sense of great  danger, suicidal thoughts/plans, thoughts of violence, and unusual visions or sounds. Heme:  Denies bleeding.  Physical Exam  General:  Gen: well appearing. no resp difficulty  Eyes:  No corneal or conjunctival inflammation noted. Vision grossly normal. Ears:  External ear exam shows no significant lesions or deformities.  Otoscopic examination reveals clear canals, tympanic membranes are intact bilaterally without bulging, retraction, inflammation or discharge. Hearing is grossly normal bilaterally. Mouth:  Oral mucosa and oropharynx without lesions or exudates.  Teeth in good repair. Neck:  soft bruits carotids, bilaterally. Lungs:  Normal  respiratory effort, chest expands symmetrically. Lungs are clear to auscultation, no crackles or wheezes. Heart:  no murmurs, RRR. Abdomen:  Bowel sounds positive,abdomen soft and non-tender without masses, organomegaly or hernias noted. Extremities:  trace edema bilaterally. Psych:  Cognition and judgment appear intact. Alert and cooperative with normal attention span and concentration. No apparent delusions, illusions, hallucinations   Impression & Recommendations:  Problem # 1:  ADJUSTMENT DISORDER WITH ANXIETY (ICD-309.24) Assessment New Has had a lot of changes in her life, but she has a good outlook.  Time spent with patient 45  minutes, more than 50% of this time was spent counseling patient on signs of anxiety and depression.  No signs of depression and anxiety is very situational and intermittent.  Discussed about using benzos regularly and its addictive properties.  I do feel that something short acting like a benzo would be appropriate in her situation.  If becomes something she uses on regular bases, would prefer something like Paxil.  Also discussed couselling as an option.  She will think about it.  Problem # 2:  INCONTINENCE, URGE (ICD-788.31) Assessment: New Had UA with urologist recently,. unnecessary to repeat for now.  She is anxious about adding more medications and I would rather use behavioral techniques as first line therapy.  Discussed Kegal exercises and bladder training today.  F/u with me in one month.  Problem # 3:  RENAL INSUFFICIENCY, CHRONIC (ICD-585.9) Assessment: Unchanged s/p renal cell CA. Recheck BMET today.  Appears past baselines 1.3- 1.5.  Problem # 4:  CARDIOMYOPATHY, SECONDARY (ICD-425.9) Assessment: Unchanged Defer to cards, seems to be doing very well.  Has ICD.  Continue current meds.  Problem # 5:  ATRIAL FIBRILLATION (ICD-427.31) Assessment: Unchanged ON coumadin.   Her updated medication list for this problem includes:    Disopyramide  Phosphate 150 Mg Caps (Disopyramide phosphate) .Marland Kitchen... 1 cap two times a day    Carvedilol 12.5 Mg Tabs (Carvedilol) .Marland Kitchen... 1 tab two times a day    Warfarin Sodium 5 Mg Tabs (Warfarin sodium) ..... Use as directed by anticoagulation clinic    Aspirin 81 Mg Tbec (Aspirin) .Marland Kitchen... Take one tablet by mouth daily    Verapamil Hcl Cr 180 Mg Xr24h-cap (Verapamil hcl) .Marland Kitchen... Take one capsule by mouth daily  Problem # 6:  CAROTID ARTERY STENOSIS (ICD-433.10) Assessment: Unchanged Per cards, appears stable.  Scheduled for U/s in spring.   Her updated medication list for this problem includes:    Warfarin Sodium 5 Mg Tabs (Warfarin sodium) ..... Use as directed by anticoagulation clinic    Aspirin 81 Mg Tbec (Aspirin) .Marland Kitchen... Take one tablet by mouth daily  Problem # 7:  Preventive Health Care (ICD-V70.0) Assessment: Comment Only Reviewed preventive care protocols, scheduled due services, and updated immunizations Discussed nutrition, exercise, diet, and healthy lifestyle..  FLP, BMET, hepatic panel today. Zostavax, influenza today. Set up mammogram and DEXA scan today.  Complete Medication List: 1)  Disopyramide Phosphate 150 Mg Caps (Disopyramide phosphate) .Marland Kitchen.. 1 cap two times a day 2)  Carvedilol 12.5 Mg Tabs (Carvedilol) .Marland Kitchen.. 1 tab two times a day 3)  Warfarin Sodium 5 Mg Tabs (Warfarin sodium) .... Use as directed by anticoagulation clinic 4)  Aspirin 81 Mg Tbec (Aspirin) .... Take one tablet by mouth daily 5)  Hydrochlorothiazide 25 Mg Tabs (Hydrochlorothiazide) .... Take one tablet by mouth daily. 6)  Simvastatin 80 Mg Tabs (Simvastatin) .... Take one tablet by mouth daily at bedtime 7)  Verapamil Hcl Cr 180 Mg Xr24h-cap (Verapamil hcl) .... Take one capsule by mouth daily 8)  Alprazolam 0.25 Mg Tabs (Alprazolam) .Marland Kitchen.. 1 tab every night as needed for anxiety.  Other Orders: Radiology Referral (Radiology)  Patient Instructions: 1)  It was so nice to meet you, Ms. Meland. 2)  Please stop by  on the way out to see Rosaria Ferries, she will help set up your mammogram. 3)  We will call you in a couple of days with your lab results. 4)  Have a great week. Prescriptions: ALPRAZOLAM 0.25 MG TABS (ALPRAZOLAM) 1 tab every night as needed for anxiety.  #30 x 0   Entered and Authorized by:   [name] MD   Signed by:   [name] MD on 04/10/2009   Method used:   Print then Give to Patient   RxID:   [id number]   Current Allergies (reviewed today): ! CODEINE ! SULFA  Flu Vaccine Consent Questions     Do you have a history of severe allergic reactions to this vaccine? no    Any prior history of allergic reactions to egg and/or gelatin? no    Do you have a sensitivity to the preservative Thimersol? no    Do you have a past history of Guillan-Barre Syndrome? no    Do you currently have an acute febrile illness? no    Have you ever had a severe reaction to latex? no    Vaccine information given and explained to patient? yes    Are you currently pregnant? no    Lot Number:[id number]   Exp Date:11/02/2009   Site Given  Left Deltoid IMdflu Lugene Fuquay CMA (AAMA)  April 10, 2009 2:13 PM Flu Vaccine Result Date:  04/10/2009 Flu Vaccine Result:  given Flu Vaccine Next Due:  1 yr TD Result Date:  01/25/2004 TD Result:  historical TD Next Due:  10 yr Pneumovax Result Date:  04/21/2001 Pneumovax Result:  historical Herpes Zoster Result Date:  04/10/2009 Herpes Zoster Result:  given Flex Sig Next Due:  Not Indicated Colonoscopy Result Date:  04/25/2004 Colonoscopy Result:  historical Colonoscopy Next Due:  10 yr Hemoccult Next Due:  Not Indicated PAP Next Due:  Not Indicated  Appended Document: NEW PATIENT TO EST/MK DR Phillip Heal REF   Immunizations Administered:  Zostavax # 1:    Vaccine Type: Zostavax    Site: left deltoid    Mfr: Merck    Dose: 0.5 ml    Route: [place]    Given by: Christena Deem CMA ([place])    Exp. Date: 05/20/2010    Lot #: [id number]    VIS given: 02/15/05  given April 10, 2009.

## 2010-06-05 NOTE — Progress Notes (Signed)
Summary: Weak   Phone Note Call from Patient Call back at Home Phone [phone]   Caller: Patient Summary of Call: patient states that since her colonoscopy she feels weak and lightheaded. she denies any blood in her stool. Initial call taken by: Bernita Buffy CMA ([place]),  November 07, 2009 9:52 AM  Follow-up for Phone Call        Pt has colonoscopy on 11/02/09.  Since then she has felt weak and lightheaded.  No signs of any bleeding but pt states she did notice one small dark bm.  Pt states she has noticed some lower abd pain. Pt states she stayed in bed all day yeaterday because she felt so bad.  See colonoscopy report.  Follow-up by: Alberteen Spindle RN,  November 07, 2009 10:39 AM  Additional Follow-up for Phone Call Additional follow up Details #1::        i care issue...not related to colonoscopy... Additional Follow-up by: Sable Feil MD Shanna Cisco 12:52 PM    Additional Follow-up for Phone Call Additional follow up Details #2::    Pt notified. Follow-up by: Alberteen Spindle RN,  November 07, 2009 1:05 PM

## 2010-06-05 NOTE — Progress Notes (Signed)
Summary: SURGICAL CLEARANCE   Phone Note Call from Patient Call back at Home Phone [phone]   Caller: SELF Call For: Nohea Kras Summary of Call: NEEDS A LETTER FAXED GIVING SURGICAL CLEARANCE AND AN OKAY TO PUT THE PATIENT TO SLEEP-ATTN: [place] [id number] Initial call taken by: Zenovia Jarred,  November 18, 2008 11:11 AM  Follow-up for Phone Call        this is ok. please pass onto nurses. thanks -dan Jolaine Artist, MD, [facility]  November 22, 2008 10:51 PM      Appended Document: SURGICAL CLEARANCE Clearance note for surgery and anesthesia faxed to [facility] at [phone].

## 2010-06-05 NOTE — Assessment & Plan Note (Signed)
Summary: RECTAL BLEEDING   Vital Signs:  Patient profile:   77 year old female Height:      67.5 inches Weight:      174.25 pounds BMI:     26.99 Temp:     97.5 degrees F oral Pulse rate:   68 / minute Pulse rhythm:   regular BP sitting:   140 / 90  (left arm) Cuff size:   regular  Vitals Entered By: Sherrian Divers CMA Deborra Medina) (October 12, 2009 4:00 PM) CC: rectal bleeding   History of Present Illness: 77 yo here for bright blood per rectum.  Woke up this morning, had soft stool, a lot of bright red blood in the toilet bowel.  has occured a few more times today. Was not constipated or straining recently.  Last colonoscopy was normal in 2005.  Has noticed over past two months that her stool have changed in caliber, maybe thinner.  Defecation was a little sore today. No changes in weight, fevers or chills.  Does have a h/o renal CA.  Current Medications (verified): 1)  Disopyramide Phosphate 150 Mg Caps (Disopyramide Phosphate) .Marland Kitchen.. 1 Cap Two Times A Day 2)  Carvedilol 12.5 Mg Tabs (Carvedilol) .Marland Kitchen.. 1 Tab Two Times A Day 3)  Warfarin Sodium 5 Mg Tabs (Warfarin Sodium) .... Use As Directed By Anticoagulation Clinic 4)  Aspirin 81 Mg Tbec (Aspirin) .... Take One Tablet By Mouth Daily 5)  Hydrochlorothiazide 25 Mg Tabs (Hydrochlorothiazide) .... Take One Tablet By Mouth Daily. 6)  Simvastatin 80 Mg Tabs (Simvastatin) .... Take One Tablet By Mouth Daily At Bedtime 7)  Verapamil Hcl Cr 180 Mg Xr24h-Cap (Verapamil Hcl) .... Take One Capsule By Mouth Daily 8)  Alprazolam 0.25 Mg Tabs (Alprazolam) .Marland Kitchen.. 1 Tab Every Night As Needed For Anxiety. 9)  Furosemide 20 Mg Tabs (Furosemide) .... Take One Tablet By Mouth Daily As Needed 10)  Klor-Con M20 20 Meq Cr-Tabs (Potassium Chloride Crys Cr) .Marland Kitchen.. 1 Tab By Mouth Daily With Furosemide  Allergies: 1)  ! Codeine 2)  ! Sulfa  Review of Systems      See HPI General:  Denies fever, loss of appetite, and malaise. GI:  Complains of bloody  stools and change in bowel habits; denies abdominal pain, dark tarry stools, nausea, and vomiting.  Physical Exam  General:  Gen: well appearing.   Abdomen:  Bowel sounds positive,abdomen soft and non-tender without masses, organomegaly or hernias noted. Rectal:  no external abnormalities, no hemorrhoids, normal sphincter tone, no masses, stool positive for occult blood, and anal fissure.   Psych:  Cognition and judgment appear intact. Alert and cooperative with normal attention span and concentration. No apparent delusions, illusions, hallucinations   Impression & Recommendations:  Problem # 1:  BLOOD IN STOOL (ICD-578.1) Assessment New Likely due to visualized anal fissue but given changes in stool caliber and h/o renal CA, will refer to GI for further work up.  Red flags given requiring immediate follow up , such as increased bleeding. Orders: Gastroenterology Referral (GI)  Complete Medication List: 1)  Disopyramide Phosphate 150 Mg Caps (Disopyramide phosphate) .Marland Kitchen.. 1 cap two times a day 2)  Carvedilol 12.5 Mg Tabs (Carvedilol) .Marland Kitchen.. 1 tab two times a day 3)  Warfarin Sodium 5 Mg Tabs (Warfarin sodium) .... Use as directed by anticoagulation clinic 4)  Aspirin 81 Mg Tbec (Aspirin) .... Take one tablet by mouth daily 5)  Hydrochlorothiazide 25 Mg Tabs (Hydrochlorothiazide) .... Take one tablet by mouth daily. 6)  Simvastatin  80 Mg Tabs (Simvastatin) .... Take one tablet by mouth daily at bedtime 7)  Verapamil Hcl Cr 180 Mg Xr24h-cap (Verapamil hcl) .... Take one capsule by mouth daily 8)  Alprazolam 0.25 Mg Tabs (Alprazolam) .Marland Kitchen.. 1 tab every night as needed for anxiety. 9)  Furosemide 20 Mg Tabs (Furosemide) .... Take one tablet by mouth daily as needed 10)  Klor-con M20 20 Meq Cr-tabs (Potassium chloride crys cr) .Marland Kitchen.. 1 tab by mouth daily with furosemide  Patient Instructions: 1)  Please stop by to see Rosaria Ferries on your way out to set up a GI referral.  Current Allergies (reviewed  today): ! CODEINE ! SULFA

## 2010-06-05 NOTE — Miscellaneous (Signed)
Summary: DEVICE PRELOAD  Clinical Lists Changes  Observations: Added new observation of ICD INDICATN: HOCM WITH SYNCOPE (06/09/2008 14:52) Added new observation of ICDLEADSTAT2: active (06/09/2008 14:52) Added new observation of ICDLEADSER2: [id number] (06/09/2008 14:52) Added new observation of ICDLEADMOD2: [id number]  (06/09/2008 14:52) Added new observation of ICDLEADDOI2: 02/28/2005  (06/09/2008 14:52) Added new observation of ICDLEADLOC2: RV  (06/09/2008 14:52) Added new observation of ICDLEADSTAT1: active  (06/09/2008 14:52) Added new observation of ICDLEADSER1: [id number]  (06/09/2008 14:52) Added new observation of ICDLEADMOD1: 5076  (06/09/2008 14:52) Added new observation of ICDLEADDOI1: 02/28/2005  (06/09/2008 14:52) Added new observation of ICDLEADLOC1: RA  (06/09/2008 14:52) Added new observation of ICD IMP MD: Cristopher Peru, MD  (06/09/2008 14:52) Added new observation of ICD IMPL DTE: 02/28/2005  (06/09/2008 14:52) Added new observation of ICD SERL#: [id number]  (06/09/2008 14:52) Added new observation of ICD MODL#: [id number]  (06/09/2008 14:52) Added new observation of ICDMANUFACTR: Medtronic  (06/09/2008 14:52) Added new observation of CARDIO MD: Cristopher Peru, MD  (06/09/2008 14:52)      ICD Specifications Following MD:  Cristopher Peru, MD     ICD Vendor:  Medtronic     ICD Model Number:  [id number]     ICD Serial Number:  [id number] ICD DOI:  02/28/2005     ICD Implanting MD:  Cristopher Peru, MD  Lead 1:    Location: RA     DOI: 02/28/2005     Model #: [id number]     Serial #: [id number]     Status: active Lead 2:    Location: RV     DOI: 02/28/2005     Model #: [id number]     Serial #[id number]     Status: active  Indications::  HOCM WITH SYNCOPE

## 2010-06-05 NOTE — Medication Information (Signed)
Summary: CCR/AMD   Anticoagulant Therapy  Managed by: Gabriel Cirri, RN, BSN Referring MD: Glori Bickers Supervising MD: Burt Knack MD, Legrand Como Indication 1: Atrial Fibrillation (ICD-427.31) Lab Used: [facility]--[place] Site: [place] INR POC 3.1  Dietary changes: no    Health status changes: no    Bleeding/hemorrhagic complications: no    Recent/future hospitalizations: no    Any changes in medication regimen? no    Recent/future dental: no  Any missed doses?: no       Is patient compliant with meds? yes       Allergies: 1)  ! Codeine  Anticoagulation Management History:      Her anticoagulation is being managed by telephone today.  Positive risk factors for bleeding include an age of 20 years or older.  The bleeding index is 'intermediate risk'.  Negative CHADS2 values include Age > 62 years old.  The start date was 12/17/2005.  Her last INR was 2.2.  Anticoagulation responsible provider: Burt Knack MD, Legrand Como.  INR POC: 3.1.    Anticoagulation Management Assessment/Plan:      The patient's current anticoagulation dose is Warfarin sodium 5 mg tabs: Use as directed by Anticoagulation Clinic.  The target INR is 2.0-3.0.  The next INR is due 03/07/2009.  Anticoagulation instructions were given to patient.  Results were reviewed/authorized by Gabriel Cirri, RN, BSN.  She was notified by Gabriel Cirri, RN, BSN.         Prior Anticoagulation Instructions: 2.5mg  qd  Current Anticoagulation Instructions: The patient is to continue with the same dose of coumadin.  This dosage includes: coumadin 2.5 mg daily

## 2010-06-05 NOTE — Progress Notes (Signed)
Summary: RX  Medications Added WARFARIN SODIUM 5 MG TABS (WARFARIN SODIUM) Use as directed by Anticoagulation Clinic       Phone Note Refill Request Message from:  Pharmacy on October 27, 2008 10:37 AM  [facility] [phone]  Initial call taken by: Zenovia Jarred,  October 27, 2008 10:38 AM    New/Updated Medications: WARFARIN SODIUM 5 MG TABS (WARFARIN SODIUM) Use as directed by Anticoagulation Clinic   Prescriptions: WARFARIN SODIUM 5 MG TABS (WARFARIN SODIUM) Use as directed by Anticoagulation Clinic  #30 x 3   Entered by:   Philemon Kingdom   Authorized by:   Loralie Champagne, MD   Signed by:   Philemon Kingdom on 10/27/2008   Method used:   Electronically to        [facility] (retail)       [address]       [place], [place]  [address]       Ph: [phone]       Fax: [phone]   RxID:   [id number]

## 2010-06-05 NOTE — Medication Information (Signed)
Summary: CCR   Anticoagulant Therapy  Managed by: Gabriel Cirri, RN, BSN Referring MD: Glori Bickers Supervising MD: Angelena Form MD, Harrell Gave Indication 1: Atrial Fibrillation (ICD-427.31) Lab Used: [facility] Site: [place] PT 21.9 INR POC 1.93 INR RANGE 2.0-3.0  Dietary changes: no    Health status changes: no    Bleeding/hemorrhagic complications: no    Recent/future hospitalizations: no    Any changes in medication regimen? no    Recent/future dental: no  Any missed doses?: no       Is patient compliant with meds? yes       Allergies: 1)  ! Codeine  Anticoagulation Management History:      Her anticoagulation is being managed by telephone today.  Positive risk factors for bleeding include an age of 75 years or older.  The bleeding index is 'intermediate risk'.  Negative CHADS2 values include Age > 10 years old.  The start date was 12/17/2005.  Her last INR was 3.1.  Prothrombin time is 21.9.  Anticoagulation responsible provider: Angelena Form MD, Harrell Gave.  INR POC: 1.93.    Anticoagulation Management Assessment/Plan:      The patient's current anticoagulation dose is Warfarin sodium 5 mg tabs: Use as directed by Anticoagulation Clinic.  The target INR is 2.0-3.0.  The next INR is due 03/07/2009.  Anticoagulation instructions were given to patient.  Results were reviewed/authorized by Gabriel Cirri, RN, BSN.  She was notified by Gabriel Cirri, RN, BSN.         Prior Anticoagulation Instructions: The patient is to continue with the same dose of coumadin.  This dosage includes: coumadin 2.5 mg daily   Current Anticoagulation Instructions: coumadin 5mg  today then resume coumadin 2.5 mg daily.

## 2010-06-05 NOTE — Medication Information (Signed)
Summary: CCR  Anticoagulant Therapy  Managed by: Freddrick March, RN, BSN Referring MD: Glori Bickers, MD PCP: [name] MD Supervising MD: Rockey Situ Indication 1: Atrial Fibrillation (ICD-427.31) Lab Used: [facility] Site: [place] INR POC 4.1 INR RANGE 2.0-3.0  Dietary changes: no     Bleeding/hemorrhagic complications: no     Any changes in medication regimen? no     Any missed doses?: no       Is patient compliant with meds? yes      Comments: Missed May appt  Allergies: 1)  ! Codeine 2)  ! Sulfa  Anticoagulation Management History:      The patient is taking warfarin and comes in today for a routine follow up visit.  Positive risk factors for bleeding include an age of 75 years or older.  The bleeding index is 'intermediate risk'.  Negative CHADS2 values include Age > 79 years old.  The start date was 12/17/2005.  Her last INR was 2.01.  Anticoagulation responsible provider: Chauntel Windsor.  INR POC: 4.1.  Cuvette Lot#: [id number].  Exp: 12/2010.    Anticoagulation Management Assessment/Plan:      The patient's current anticoagulation dose is Warfarin sodium 5 mg tabs: Use as directed by Anticoagulation Clinic.  The target INR is 2.0-3.0.  The next INR is due 10/23/2009.  Anticoagulation instructions were given to patient.  Results were reviewed/authorized by Freddrick March, RN, BSN.  She was notified by Freddrick March RN.         Prior Anticoagulation Instructions: The patient is to continue with the same dose of coumadin.  This dosage includes: coumadin 2.5 mg daily  Current Anticoagulation Instructions: INR 4.1  Skip today's dosage of coumadin, then resume same dosage 1/2 tablet daily.  Recheck in 2 weeks.

## 2010-06-05 NOTE — Medication Information (Signed)
Summary: ccr   Anticoagulant Therapy  Managed by: Gabriel Cirri, RN, BSN Referring MD: Glori Bickers Supervising MD: [name] MD, Codey Burling Indication 1: Atrial Fibrillation (ICD-427.31) Lab Used: [facility] Site: [facility] 27.1 INR RANGE 2.0-3.0  Dietary changes: no    Health status changes: no    Bleeding/hemorrhagic complications: no    Recent/future hospitalizations: no    Any changes in medication regimen? no    Recent/future dental: no  Any missed doses?: no       Is patient compliant with meds? yes       Allergies: 1)  ! Codeine 2)  ! Sulfa  Anticoagulation Management History:      Her anticoagulation is being managed by telephone today.  Positive risk factors for bleeding include an age of 75 years or older.  The bleeding index is 'intermediate risk'.  Negative CHADS2 values include Age > 55 years old.  The start date was 12/17/2005.  Her last INR was 2.12 and today's INR is 2.54.  Prothrombin time is 27.1.  Anticoagulation responsible provider: [name] MD, Naliyah Neth.    Anticoagulation Management Assessment/Plan:      The patient's current anticoagulation dose is Warfarin sodium 5 mg tabs: Use as directed by Anticoagulation Clinic.  The target INR is 2.0-3.0.  The next INR is due 06/20/2009.  Anticoagulation instructions were given to patient.  Results were reviewed/authorized by Gabriel Cirri, RN, BSN.  She was notified by Gabriel Cirri, RN, BSN.         Prior Anticoagulation Instructions: The patient is to continue with the same dose of coumadin.  This dosage includes: coumadin 2.5 mg daily  Current Anticoagulation Instructions: Same as Prior Instructions.

## 2010-06-05 NOTE — Progress Notes (Signed)
Summary: alprazolam  Phone Note Refill Request Message from:  Fax from Pharmacy on December 25, 2009 10:21 AM  Refills Requested: Medication #1:  ALPRAZOLAM 0.25 MG TABS 1 tab every night as needed for anxiety.   Last Refilled: 11/27/2009 Refill request from [facility].   Initial call taken by: Lacretia Nicks,  December 25, 2009 10:23 AM  Follow-up for Phone Call        Rx called to pharmacy Follow-up by: Sherrian Divers CMA Deborra Medina),  December 25, 2009 10:37 AM    Prescriptions: ALPRAZOLAM 0.25 MG TABS (ALPRAZOLAM) 1 tab every night as needed for anxiety.  #30 x 0   Entered and Authorized by:   [name] MD   Signed by:   [name] MD on 12/25/2009   Method used:   Telephoned to ...       [facility] (retail)       [address]       [place], [place]  [address]       Ph: [phone]       Fax: [phone]   RxID:   [id number]

## 2010-06-05 NOTE — Procedures (Signed)
Summary: Colonoscopy  Patient: [name] Note: All result statuses are Final unless otherwise noted.  Tests: (1) Colonoscopy (COL)   COL Colonoscopy           [facility]     [place], [place]  [address]           COLONOSCOPY PROCEDURE REPORT           PATIENT:  [name], [name]  MR#:  [id number]     BIRTHDATE:  [birth year], 75 yrs. old  GENDER:  female     ENDOSCOPIST:  Milus Banister, MD     REF. BY:  [name], M.D.     Loralee Pacas. Sharlett Iles, M.D.     PROCEDURE DATE:  11/02/2009     PROCEDURE:  Diagnostic Colonoscopy     ASA CLASS:  Class II     INDICATIONS:  rectal bleeding (single day of rectal bleeding in     May, was not anemic, takes coumadin)     MEDICATIONS:   Fentanyl 100 mcg IV, Versed 10 mg IV           DESCRIPTION OF PROCEDURE:   After the risks benefits and     alternatives of the procedure were thoroughly explained, informed     consent was obtained.  Digital rectal exam was performed and     revealed no rectal masses.   The  endoscope was introduced through     the anus and advanced to the cecum, which was identified by both     the appendix and ileocecal valve, without limitations.  The     quality of the prep was adequate, using MoviPrep.  The instrument     was then slowly withdrawn as the colon was fully examined.     <<PROCEDUREIMAGES>>           FINDINGS:  Mild diverticulosis was found in the sigmoid to     descending colon segments.  This was otherwise a normal     examination of the colon (see image2 and image1).   Retroflexed     views in the rectum revealed no abnormalities.    The scope was     then withdrawn from the patient and the procedure completed.           COMPLICATIONS:  None           ENDOSCOPIC IMPRESSION:     1) Mild diverticulosis in the sigmoid to descending colon     segments     2) Otherwise normal examination; no polyps or cancers           RECOMMENDATIONS:     1) The minor rectal  bleeding was probably related to small     hemorrhoid or anal fissure that has since resolved.     2) Follow up as needed with Dr. Verl Blalock.  Given age,     comorbidities there is probably no need for routine colon cancer     screening (that type of testing usually stops around age 15).     3) OK to restart coumadin today.           ______________________________     Milus Banister, MD           n.     eSIGNED:   Milus Banister at 11/02/2009 10:11 AM           Karlton Lemon,  Angelize, [id number]  Note: An exclamation mark (!) indicates a result that was not dispersed into the flowsheet. Document Creation Date: 11/02/2009 10:11 AM _______________________________________________________________________  (1) Order result status: Final Collection or observation date-time: 11/02/2009 10:04 Requested date-time:  Receipt date-time:  Reported date-time:  Referring Physician:   Ordering Physician: Owens Loffler [phone]) Specimen Source:  Source: Tawanna Cooler Order Number: [id number] Lab site:   Appended Document: Colonoscopy Waunita Schooner, I suspect her bleeding was ano-rectal (probably a resolved hemorrhoid)

## 2010-06-05 NOTE — Medication Information (Signed)
Summary: seeing [name] at 11:00am/ewj   Anticoagulant Therapy  Managed by: Freddrick March, RN, BSN Referring MD: Glori Bickers, MD PCP: [name] MD Supervising MD: Rockey Situ Indication 1: Atrial Fibrillation (ICD-427.31) Lab Used: [facility] Site: [place] INR POC 2.2 INR RANGE 2.0-3.0  Dietary changes: no     Bleeding/hemorrhagic complications: yes       Details: pt was passing blood had colonscopy june 30.    Any changes in medication regimen? no     Any missed doses?: no       Is patient compliant with meds? yes       Allergies: 1)  ! Codeine 2)  ! Sulfa  Anticoagulation Management History:      The patient is taking warfarin and comes in today for a routine follow up visit.  Positive risk factors for bleeding include an age of 73 years or older and history of GI bleeding.  The bleeding index is 'intermediate risk'.  Negative CHADS2 values include Age > 34 years old.  The start date was 12/17/2005.  Her last INR was 2.01.  Anticoagulation responsible provider: gollan.  INR POC: 2.2.  Cuvette Lot#: [id number].  Exp: 02/2011.    Anticoagulation Management Assessment/Plan:      The patient's current anticoagulation dose is Warfarin sodium 5 mg tabs: Use as directed by Anticoagulation Clinic.  The target INR is 2.0-3.0.  The next INR is due 02/16/2010.  Anticoagulation instructions were given to patient.  Results were reviewed/authorized by Freddrick March, RN, BSN.  She was notified by Cordelia Pen, RN.         Prior Anticoagulation Instructions: INR 3.5  Skip today's dosage of Coumadin, then resume same dosage 2.5mg  daily.  Recheck in 3 weeks.    Current Anticoagulation Instructions: INR 2.2  Continue taking 1/2 tab daily. Recheck in 4 weeks.

## 2010-06-05 NOTE — Progress Notes (Signed)
Summary: triage   Phone Note From Other Clinic Call back at [phone]   Caller: Caren Griffins Call For: Dr. Olevia Perches (doctor of the day) Reason for Call: Schedule Patient Appt Summary of Call: Dr. Deborra Medina would like pt seen asap for blood in stool and changes in bowels Initial call taken by: Lucien Mons,  October 12, 2009 4:33 PM  Follow-up for Phone Call        appt made with [facility] for 10/13/09. Follow-up by: Christian Mate CMA Deborra Medina),  October 12, 2009 4:45 PM

## 2010-06-05 NOTE — Progress Notes (Signed)
Summary: Triage   Phone Note Call from Patient   Caller: daughter Kawanza Loureiro [id number] Call For: Dr. Ardis Hughs Reason for Call: Talk to Nurse Summary of Call: pt is having rectal bleeding and when she coughs she is coughing up blood Initial call taken by: Webb Laws,  October 26, 2009 3:45 PM  Follow-up for Phone Call        daughter says pt. started coughing up blood about 2 p.m. today.Says it is occurring about every 15 minutes and is about a teaspoon at a time. Also says her Mom told her Sunday that she had 2 episodes of rectal bleeding on friday night. and stool's arevery bblack.Daughter advised to take pt. to ER for eval.as pt. is on Plavix also. Follow-up by: Abel Presto RN,  October 26, 2009 4:18 PM  Additional Follow-up for Phone Call Additional follow up Details #1::        will forward to Dr. Sharlett Iles, this is his patient Additional Follow-up by: Milus Banister MD,  October 26, 2009 11:14 PM     Appended Document: Triage I have never seen this patient. I get repeated nights about her. Per the nurse practitioner, she is supposed to have colonoscopy with nurse anesthetist assistance at [facility] with Dr. Ardis Hughs. Please schedule this procedure.  Appended Document: Triage You were the supervising MD on the day she saw Tye Savoy NP, 10-13-09.  She is scheduled with Dr. Ardis Hughs at [facility] for a Colonoscopy on 11-02-09.  Nevin Bloodgood asked Dr. Ardis Hughs if he would do this and he said yes. FYI

## 2010-06-05 NOTE — Progress Notes (Signed)
Summary: COUMADIN follow-up   Phone Note Call from Patient Call back at Home Phone [phone]   Caller: SELF Call For: [name] Summary of Call: PT CANNOT AFFORD TO GET THE COUMADIN DRAWN HERE ANYMORE [place] Initial call taken by: Zenovia Jarred,  December 27, 2009 9:30 AM  Follow-up for Phone Call        Called spoke with pt advised she can call her primary MD Dr Deborra Medina at [facility] and see if she will agree to follow pt's Coumadin.  Pt states she has an appt with Dr Haroldine Laws on 01/05/10 and would like to get her Coumadin checked at that same time as OV. Advised pt we would like to check INR sooner d/t supratherapeutic INR at last OV.  Pt wishes to wait till OV aware of risks assoc with delay in f/u.  Pt will call her primary MD and see if they will agree to follow in meantime and will notify us at [facility] or sooner if able to get done there sooner than DB's appt.  Follow-up by: Freddrick March RN,  December 27, 2009 12:42 PM

## 2010-06-05 NOTE — Progress Notes (Signed)
Summary: refill request for alprazolam  Phone Note Refill Request Message from:  Fax from Pharmacy  Refills Requested: Medication #1:  ALPRAZOLAM 0.25 MG TABS 1 tab every night as needed for anxiety.   Last Refilled: 10/30/2009 Faxed request from [facility], [phone].  Initial call taken by: Marty Heck CMA,  November 24, 2009 2:44 PM  Follow-up for Phone Call        Rx called to pharmacy Follow-up by: Sherrian Divers CMA Deborra Medina),  November 27, 2009 8:40 AM    Prescriptions: ALPRAZOLAM 0.25 MG TABS (ALPRAZOLAM) 1 tab every night as needed for anxiety.  #30 x 0   Entered and Authorized by:   [name] MD   Signed by:   [name] MD on 11/26/2009   Method used:   Telephoned to ...       [facility] (retail)       [address]       [place], [place]  [address]       Ph: [phone]       Fax: [phone]   RxID:   [id number]

## 2010-06-05 NOTE — Procedures (Signed)
Summary: PACER/AMD      Allergies Added:   Current Medications (verified): 1)  Disopyramide Phosphate 150 Mg Caps (Disopyramide Phosphate) .Marland Kitchen.. 1 Cap Two Times A Day 2)  Carvedilol 12.5 Mg Tabs (Carvedilol) .Marland Kitchen.. 1 Tab Two Times A Day 3)  Warfarin Sodium 5 Mg Tabs (Warfarin Sodium) .... Use As Directed By Anticoagulation Clinic 4)  Aspirin 81 Mg Tbec (Aspirin) .... Take One Tablet By Mouth Daily 5)  Hydrochlorothiazide 25 Mg Tabs (Hydrochlorothiazide) .... Take One Tablet By Mouth Daily. 6)  Simvastatin 80 Mg Tabs (Simvastatin) .... Take One Tablet By Mouth Daily At Bedtime 7)  Verapamil Hcl Cr 180 Mg Xr24h-Cap (Verapamil Hcl) .... Take One Capsule By Mouth Daily  Allergies (verified): 1)  ! Codeine    ICD Specifications Following MD:  Cristopher Peru, MD     ICD Vendor:  [employer]tronic     ICD Model Number:  [id number]     ICD Serial Number:  [id number] ICD DOI:  02/28/2005     ICD Implanting MD:  Cristopher Peru, MD  Lead 1:    Location: RA     DOI: 02/28/2005     Model #: [id number]     Serial #: [id number]     Status: active Lead 2:    Location: RV     DOI: 02/28/2005     Model #: [id number]     Serial #[id number]     Status: active  Indications::  HOCM WITH SYNCOPE   ICD Follow Up Remote Check?  No Battery Voltage:  2.92 V     Charge Time:  8.53 seconds       ICD Device Measurements Atrium:  Amplitude: 1.7 mV, Impedance: 512 ohms, Threshold: 1.0 V at 0.4 msec Right Ventricle:  Amplitude: 6.6 mV, Impedance: 424 ohms, Threshold: 1.0 V at 0.2 msec Shock Impedance: 43/54 ohms   Episodes MS Episodes:  6     Percent Mode Switch:  0%     Coumadin:  Yes Shock:  0     ATP:  0     Nonsustained:  0     Atrial Pacing:  93.1%     Ventricular Pacing:  100%  Brady Parameters Mode DDDR     Lower Rate Limit:  60     Upper Rate Limit 140 PAV 150     Sensed AV Delay:  120  Tachy Zones VF:  200     VT:  250     VT1:  171     Tech Comments:  Longest mode switch 14 seconds, + coumadin No parameter changes 6949  lead stable  SIC  0 We will see her back in 3 months time. Alma Friendly, LPN  August 30, 624THL 10:42 AM  MD Comments:  agree with above Sophronia Simas, MD, [facility]  January 16, 2009 4:12 PM   ICD Specifications Following MD:  Cristopher Peru, MD     Referring MD:  Glori Bickers ICD Vendor:  Medtronic     ICD Model Number:  [phone]     ICD Serial Number:  [id number] ICD DOI:  02/28/2005     ICD Implanting MD:  Cristopher Peru, MD  Lead 1:    Location: RA     DOI: 02/28/2005     Model #: [id number]     Serial #: [id number]     Status: active Lead 2:    Location: RV     DOI: 02/28/2005     Model #: [id number]  Serial #: [id number]     Status: active  Indications::  HOCM WITH SYNCOPE   ICD Specs Remote Check?  No Battery Voltage:  2.92 V     Charge Time:  8.53 seconds       PPM Device Measurements Atrium:  Amplitude 1.7, Impedance 512, Threshold 1.0 V at 0.4 msec, Right Ventricle:  Amplitude 6.6, Impedance 424, Threshold 1.0 V at 0.2 msec, Left Ventricle:  Shock Impedance 43/54 ohms   Episodes MS Episodes:  6     Percent Mode Switch:  0%     Shock:  0     ATP:  0     Nonsustained:  0     Atrial Pacing:  93.1%     Ventricular Pacing:  100%  Brady Parameters  Tachy Zones Tech Comments:  Longest mode switch 14 seconds, + coumadin No parameter changes 6949 lead stable  SIC  0 We will see her back in 3 months time. Alma Friendly, LPN  August 30, 624THL 10:42 AM  MD Comments:  agree with above Sophronia Simas, MD, [facility]  January 16, 2009 4:12 PM

## 2010-06-06 LAB — CBC
HCT: 34.2 % — ABNORMAL LOW (ref 36.0–46.0)
Hemoglobin: 11.2 g/dL — ABNORMAL LOW (ref 12.0–15.0)
MCH: 29.5 pg (ref 26.0–34.0)
MCHC: 32.7 g/dL (ref 30.0–36.0)
MCV: 90 fL (ref 78.0–100.0)
Platelets: 131 10*3/uL — ABNORMAL LOW (ref 150–400)
RBC: 3.8 MIL/uL — ABNORMAL LOW (ref 3.87–5.11)
RDW: 13.6 % (ref 11.5–15.5)
WBC: 5 10*3/uL (ref 4.0–10.5)

## 2010-06-06 LAB — BASIC METABOLIC PANEL
BUN: 12 mg/dL (ref 6–23)
CO2: 31 mEq/L (ref 19–32)
Calcium: 8.9 mg/dL (ref 8.4–10.5)
Chloride: 99 mEq/L (ref 96–112)
Creatinine, Ser: 1.21 mg/dL — ABNORMAL HIGH (ref 0.4–1.2)
GFR calc Af Amer: 53 mL/min — ABNORMAL LOW (ref >60)
GFR calc non Af Amer: 44 mL/min — ABNORMAL LOW (ref >60)
Glucose, Bld: 98 mg/dL (ref 70–99)
Potassium: 4 mEq/L (ref 3.5–5.1)
Sodium: 138 mEq/L (ref 135–145)

## 2010-06-06 LAB — HEPARIN LEVEL (UNFRACTIONATED): Heparin Unfractionated: <0.1 IU/mL — ABNORMAL LOW (ref 0.30–0.70)

## 2010-06-06 LAB — PROTIME-INR
INR: 4.63 — ABNORMAL HIGH (ref 0.00–1.49)
Prothrombin Time: 43.5 seconds — ABNORMAL HIGH (ref 11.6–15.2)

## 2010-06-06 LAB — APTT: aPTT: 77 seconds — ABNORMAL HIGH (ref 24–37)

## 2010-06-07 ENCOUNTER — Inpatient Hospital Stay (HOSPITAL_COMMUNITY): Payer: PRIVATE HEALTH INSURANCE

## 2010-06-07 ENCOUNTER — Encounter (HOSPITAL_COMMUNITY): Payer: Self-pay | Admitting: Internal Medicine

## 2010-06-07 DIAGNOSIS — R079 Chest pain, unspecified: Secondary | ICD-10-CM

## 2010-06-07 LAB — CBC
HCT: 33.1 % — ABNORMAL LOW (ref 36.0–46.0)
Hemoglobin: 10.9 g/dL — ABNORMAL LOW (ref 12.0–15.0)
MCH: 28.9 pg (ref 26.0–34.0)
MCHC: 32.9 g/dL (ref 30.0–36.0)
MCV: 87.8 fL (ref 78.0–100.0)
Platelets: 163 10*3/uL (ref 150–400)
RBC: 3.77 MIL/uL — ABNORMAL LOW (ref 3.87–5.11)
RDW: 13.6 % (ref 11.5–15.5)
WBC: 4.7 10*3/uL (ref 4.0–10.5)

## 2010-06-07 LAB — PROTIME-INR
INR: 3.06 — ABNORMAL HIGH (ref 0.00–1.49)
Prothrombin Time: 31.7 seconds — ABNORMAL HIGH (ref 11.6–15.2)

## 2010-06-07 LAB — BASIC METABOLIC PANEL
BUN: 18 mg/dL (ref 6–23)
CO2: 27 mEq/L (ref 19–32)
Calcium: 9 mg/dL (ref 8.4–10.5)
Chloride: 98 mEq/L (ref 96–112)
Creatinine, Ser: 1.28 mg/dL — ABNORMAL HIGH (ref 0.4–1.2)
GFR calc Af Amer: 49 mL/min — ABNORMAL LOW (ref >60)
GFR calc non Af Amer: 41 mL/min — ABNORMAL LOW (ref >60)
Glucose, Bld: 105 mg/dL — ABNORMAL HIGH (ref 70–99)
Potassium: 3.8 mEq/L (ref 3.5–5.1)
Sodium: 137 mEq/L (ref 135–145)

## 2010-06-07 LAB — CARDIAC PANEL(CRET KIN+CKTOT+MB+TROPI)
CK, MB: 3.6 ng/mL (ref 0.3–4.0)
Relative Index: 2.4 (ref 0.0–2.5)
Total CK: 151 U/L (ref 7–177)
Troponin I: 0.01 ng/mL (ref 0.00–0.06)

## 2010-06-07 LAB — APTT: aPTT: 77 seconds — ABNORMAL HIGH (ref 24–37)

## 2010-06-07 LAB — BUN: BUN: 21 mg/dL (ref 6–23)

## 2010-06-07 LAB — D-DIMER, QUANTITATIVE (NOT AT ARMC): D-Dimer, Quant: 4.3 ug/mL-FEU — ABNORMAL HIGH (ref 0.00–0.48)

## 2010-06-07 LAB — CREATININE, SERUM
Creatinine, Ser: 1.37 mg/dL — ABNORMAL HIGH (ref 0.4–1.2)
GFR calc Af Amer: 46 mL/min — ABNORMAL LOW (ref >60)
GFR calc non Af Amer: 38 mL/min — ABNORMAL LOW (ref >60)

## 2010-06-07 MED ORDER — IOHEXOL 300 MG/ML  SOLN
80.0000 mL | Freq: Once | INTRAMUSCULAR | Status: AC | PRN
  Administered 2010-06-07: 80 mL via INTRAVENOUS

## 2010-06-07 NOTE — Progress Notes (Signed)
Summary: refill request for xanax  Phone Note Refill Request Call back at [phone] Message from:  Patient  Refills Requested: Medication #1:  ALPRAZOLAM 0.25 MG TABS 1 tab every night as needed for anxiety. Please send to [facility].  She wants this called in asap, she is going out of town at 1:00.  She apologizes for the short notice.  Initial call taken by: Marty Heck CMA, AAMA,  April 17, 2010 10:43 AM  Follow-up for Phone Call        Called to pharmacy. Follow-up by: Marty Heck CMA, AAMA,  April 17, 2010 10:59 AM    Prescriptions: ALPRAZOLAM 0.25 MG TABS (ALPRAZOLAM) 1 tab every night as needed for anxiety.  #30 x 0   Entered and Authorized by:   [name] MD   Signed by:   [name] MD on 04/17/2010   Method used:   Telephoned to ...       [facility] (retail)       [address]       [place], [place]  [address]       Ph: [phone]       Fax: [phone]   RxID:   [id number]

## 2010-06-07 NOTE — Assessment & Plan Note (Signed)
Summary: icd check/medtronic      Allergies Added:   Visit Type:  Follow-up Referring Provider:  [name] MD Primary Provider:  [name] MD   History of Present Illness: [name] returns today for ICD followup.  She has heard her device beeping for several weeks.  She denies c/p, sob, or peripheral edema.  No intercurrent ICD shocks.  She remained active without much in the way of palpitations.  Current Medications (verified): 1)  Disopyramide Phosphate 150 Mg Caps (Disopyramide Phosphate) .Marland Kitchen.. 1 Cap Two Times A Day 2)  Carvedilol 12.5 Mg Tabs (Carvedilol) .Marland Kitchen.. 1 Tab Two Times A Day 3)  Warfarin Sodium 5 Mg Tabs (Warfarin Sodium) .... Use As Directed By Anticoagulation Clinic 4)  Aspirin 81 Mg Tbec (Aspirin) .... Take One Tablet By Mouth Daily 5)  Hydrochlorothiazide 25 Mg Tabs (Hydrochlorothiazide) .... Take One Tablet By Mouth Daily. 6)  Lipitor 40 Mg Tabs (Atorvastatin Calcium) .... Take One Tablet By Mouth Daily. 7)  Verapamil Hcl Cr 180 Mg Xr24h-Cap (Verapamil Hcl) .... Take One Capsule By Mouth Daily 8)  Alprazolam 0.25 Mg Tabs (Alprazolam) .Marland Kitchen.. 1 Tab Every Night As Needed For Anxiety. 9)  Furosemide 20 Mg Tabs (Furosemide) .... Take One Tablet By Mouth Daily As Needed 10)  Klor-Con M20 20 Meq Cr-Tabs (Potassium Chloride Crys Cr) .Marland Kitchen.. 1 Tab By Mouth Daily With Furosemide  Allergies (verified): 1)  ! Codeine 2)  ! 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    Vital Signs:  Patient profile:   77 year old female Height:      67.5 inches Weight:      164 pounds BMI:     25.40 Pulse rate:   72 / minute BP sitting:   142 / 82  (left arm)  Vitals Entered By: Margaretmary Bayley CMA (April 24, 2010 11:00 AM)  Physical Exam  General:  Gen: well appearing, congestion. VSS, nontoxic appearing. Head:  Normocephalic and atraumatic. Eyes:  Conjunctiva pink, no icterus.  Mouth:  pharyngeal erythema, no exudates. Neck:  no obvious masses  Chest Wall:  Well healed ICD incision. Lungs:  Normal respiratory effort, chest expands symmetrically. Lungs are clear to auscultation, no crackles or wheezes. Heart:  no murmurs, RRR. Abdomen:  Bowel sounds positive,abdomen soft and non-tender without masses, organomegaly or hernias noted. Msk:  Symmetrical with no gross deformities. Normal posture. Extremities:  trace edema bilaterally. Neurologic:  Alert and  oriented x4;  grossly normal neurologically.    ICD Specifications Following MD:  Cristopher Peru, MD     ICD Vendor:  Medtronic     ICD Model Number:  [id number]     ICD Serial Number:  [id number] H ICD DOI:  02/28/2005     ICD Implanting MD:  Cristopher Peru, MD  Lead 1:    Location: RA     DOI: 02/28/2005     Model #: [id number]     Serial #: [id number]     Status: active Lead 2:  Location: RV     DOI: 02/28/2005     Model #: [id number]     Serial #: [id number]     Status: active  Indications::  HOCM WITH SYNCOPE   ICD Follow Up Remote Check?  No Battery Voltage:  2.62 V     Charge Time:  11.20 seconds     ICD Dependent:  No       ICD Device Measurements Atrium:  Amplitude: 1.5 mV, Impedance: 480 ohms, Threshold: 1.0 V at 0.4 msec Right Ventricle:  Amplitude: 6.1 mV, Impedance: 520 ohms, Threshold: 1.0 V at 0.2 msec Shock Impedance: 46/70 ohms   Episodes MS Episodes:  7254     Coumadin:  Yes Atrial Pacing:  95%     Ventricular Pacing:  100%  Brady  Parameters Mode DDDR     Lower Rate Limit:  60     Upper Rate Limit 140 PAV 150     Sensed AV Delay:  120  Tachy Zones VF:  200     VT:  250     VT1:  171     Tech Comments:  Device at [employer].  6949 lead stable, SIC  0.  To be scheduled for change out. Alma Friendly, LPN  December 20, 624THL 11:22 AM  MD Comments:  agree with above.  Impression & Recommendations:  Problem # 1:  ICD - IN SITU (ICD-V45.02) Her device is at [facility].  Will schedule ICD generator change.  I will consider additional lead revision at the time of her change out.  Problem # 2:  COUMADIN THERAPY (ICD-V58.61) Will stop coumadin for one day prior.  Problem # 3:  ATRIAL FIBRILLATION (ICD-427.31)  She continues to have symptoms despite medical therapy. Will follow. Her updated medication list for this problem includes:    Disopyramide Phosphate 150 Mg Caps (Disopyramide phosphate) .Marland Kitchen... 1 cap two times a day    Carvedilol 12.5 Mg Tabs (Carvedilol) .Marland Kitchen... 1 tab two times a day    Warfarin Sodium 5 Mg Tabs (Warfarin sodium) ..... Use as directed by anticoagulation clinic    Aspirin 81 Mg Tbec (Aspirin) .Marland Kitchen... Take one tablet by mouth daily  Orders: TLB-BMP (Basic Metabolic Panel-BMET) (99991111) TLB-CBC Platelet - w/Differential (85025-CBCD) TLB-PT (Protime) (85610-PTP) TLB-PTT (85730-PTTL)

## 2010-06-07 NOTE — Letter (Signed)
Summary: Implantable Device Instructions  Press photographer, [place]  [id number] [place]   [place], [place] [address]   Phone: [phone]  Fax: [phone]      Implantable Device Instructions  You are scheduled for:   _____ Generator Change  on 05/04/10 with Dr. Lovena Le.  1.  Please arrive at the [facility] at 5:45am on the day of your procedure.  2.  Do not eat or drink after midnight the night before your procedure.  3.  Complete lab work on 04/24/10.  .  You do not have to be fasting.  4.  Do NOT take these medications for the morning of your procedure:  furosemide.  Take your last dose of Coumadin on12/28/11.  5.  Plan for an overnight stay.  Bring your insurance cards and a list of your medications.  6.  Wash your chest and neck with antibacterial soap (any brand) the evening before and the morning of your procedure.  Rinse well.   *If you have ANY questions after you get home, please call the office [phone]. Leonia Reader  *Every attempt is made to prevent procedures from being rescheduled.  Due to the nauture of Electrophysiology, rescheduling can happen.  The physician is always aware and directs the staff when this occurs.

## 2010-06-07 NOTE — Assessment & Plan Note (Signed)
Summary: FLU SHOT/CLE   Nurse Visit   Allergies: 1)  ! Codeine 2)  ! Sulfa  Orders Added: 1)  Flu Vaccine 75yrs + MEDICARE PATIENTS [Q2039] 2)  Administration Flu vaccine - MCR [id number]  Flu Vaccine Consent Questions     Do you have a history of severe allergic reactions to this vaccine? no    Any prior history of allergic reactions to egg and/or gelatin? no    Do you have a sensitivity to the preservative Thimersol? no    Do you have a past history of Guillan-Barre Syndrome? no    Do you currently have an acute febrile illness? no    Have you ever had a severe reaction to latex? no    Vaccine information given and explained to patient? yes    Are you currently pregnant? no    Lot Number:[id number]   Exp Date:11/03/2010   Site Given  Right Deltoid IM

## 2010-06-07 NOTE — Progress Notes (Signed)
Summary: ICD issues   Phone Note Call from Patient Call back at Cell- [phone]   Caller: Patient Call For: Lovena Le Details for Reason: ICD  Summary of Call: Pt called stating she thinks her ICD has been making a beeping-like noise. Told pt I have not heard of this occuring, however, would ask Nevin Bloodgood to speak to her to assess if this could be from her device. Pt states she has moved all over her house to make sure it wasn't coming from another area, she is almost certain it is from her device. Please advise or call pt. Initial call taken by: Darlyne Russian RN,  April 24, 2010 9:12 AM  Follow-up for Phone Call        Spoke with patient, she has heard "beeping" for the past week at different intervals during the day.  She will be seen here in the [place] office today by Dr. Lovena Le.  Follow-up by: Alma Friendly, LPN,  December 20, [id number] 10:10 AM

## 2010-06-07 NOTE — Cardiovascular Report (Signed)
Summary: Pre-Op Orders  Pre-Op Orders   Imported By: Marilynne Drivers 05/15/2010 10:04:24  _____________________________________________________________________  External Attachment:    Type:   Image     Comment:   External Document

## 2010-06-07 NOTE — Procedures (Signed)
Summary: [facility] per pt call/lg      Allergies Added:   Current Medications (verified): 1)  Disopyramide Phosphate 150 Mg Caps (Disopyramide Phosphate) .Marland Kitchen.. 1 Cap Two Times A Day 2)  Carvedilol 12.5 Mg Tabs (Carvedilol) .Marland Kitchen.. 1 Tab Two Times A Day 3)  Warfarin Sodium 5 Mg Tabs (Warfarin Sodium) .... Use As Directed By Anticoagulation Clinic 4)  Aspirin 81 Mg Tbec (Aspirin) .... Take One Tablet By Mouth Daily 5)  Hydrochlorothiazide 25 Mg Tabs (Hydrochlorothiazide) .... Take One Tablet By Mouth Daily. 6)  Lipitor 40 Mg Tabs (Atorvastatin Calcium) .... Take One Tablet By Mouth Daily. 7)  Verapamil Hcl Cr 180 Mg Xr24h-Cap (Verapamil Hcl) .... Take One Capsule By Mouth Daily 8)  Alprazolam 0.25 Mg Tabs (Alprazolam) .Marland Kitchen.. 1 Tab Every Night As Needed For Anxiety. 9)  Furosemide 20 Mg Tabs (Furosemide) .... Take One Tablet By Mouth Daily As Needed 10)  Klor-Con M20 20 Meq Cr-Tabs (Potassium Chloride Crys Cr) .Marland Kitchen.. 1 Tab By Mouth Daily With Furosemide  Allergies (verified): 1)  ! Codeine 2)  ! Sulfa   ICD Specifications Following MD:  Cristopher Peru, MD     ICD Vendor:  Medtronic     ICD Model Number:  [id number]     ICD Serial Number:  [id number] ICD DOI:  05/04/2010     ICD Implanting MD:  Cristopher Peru, MD  Lead 1:    Location: RA     DOI: 02/28/2005     Model #: [id number]     Serial #: [id number]     Status: active Lead 2:    Location: RV     DOI: 02/28/2005     Model #: [id number]     Serial #: [id number]     Status: capped Lead 3:    Location: RV     DOI: 05/04/2010     Model #: [id number]     Serial #: [id number]     Status: active  Indications::  HOCM WITH SYNCOPE  Explantation Comments: 05/04/10 Medtronic [id number] h explanted  ICD Follow Up Remote Check?  No Battery Voltage:  3.22 V     Charge Time:  9.2 seconds     ICD Dependent:  No       ICD Device Measurements Atrium:  Amplitude: 1.3 mV, Impedance: 456 ohms, Threshold: 0.75 V at 0.4 msec Right Ventricle:  Amplitude: 7.5 mV, Impedance: 513 ohms,  Threshold: 0.5 V at 0.4 msec Shock Impedance: 44/57 ohms   Episodes MS Episodes:  68     Percent Mode Switch:  1.4%     Coumadin:  Yes Shock:  0     ATP:  0     Nonsustained:  0     Atrial Pacing:  94.8%     Ventricular Pacing:  99.2%  Brady Parameters Mode DDDR     Lower Rate Limit:  60     Upper Rate Limit 140 PAV 150     Sensed AV Delay:  120  Tachy Zones VF:  200     VT:  250     VT1:  171     Next Cardiology Appt Due:  08/05/2010 Tech Comments:  Steri strips removed, no redness  although it is very tender to touch with minimal swelling noted.  No parameter changes.  Device function normal.  Patient to call if area becomes red or warm to touch or if she develops chills or fever.  ROV 3 months with Dr. Lovena Le in [place]. Alma Friendly, LPN  May 17, 2010 3:04 PM

## 2010-06-07 NOTE — Miscellaneous (Signed)
Summary: Device change out  Clinical Lists Changes  Observations: Added new observation of ICDLEADSTAT3: active (05/04/2010 15:39) Added new observation of ICDLEADSER3: [id number] V (05/04/2010 15:39) Added new observation of ICDLEADMOD3: [id number]  (05/04/2010 15:39) Added new observation of ICDLEADDOI3: 05/04/2010  (05/04/2010 15:39) Added new observation of ICDLEADLOC3: RV  (05/04/2010 15:39) Added new observation of ICD IMPL DTE: 05/04/2010  (05/04/2010 15:39) Added new observation of ICD SERL#: [id number]  (05/04/2010 15:39) Added new observation of ICD MODL#: [id number]  (05/04/2010 15:39) Added new observation of ICDLEADSTAT2: capped  (05/04/2010 15:39) Added new observation of ICDEXPLCOMM: 05/04/10 Medtronic 7278/[id number] h explanted  (05/04/2010 15:39)       ICD Specifications Following MD:  Cristopher Peru, MD     ICD Vendor:  Medtronic     ICD Model Number:  [id number]     ICD Serial Number:  [id number] ICD DOI:  05/04/2010     ICD Implanting MD:  Cristopher Peru, MD  Lead 1:    Location: RA     DOI: 02/28/2005     Model #: [id number]     Serial #: [id number]     Status: active Lead 2:    Location: RV     DOI: 02/28/2005     Model #: [id number]     Serial #: [id number]     Status: capped Lead 3:    Location: RV     DOI: 05/04/2010     Model #: [id number]     Serial #: [id number]     Status: active  Indications::  HOCM WITH SYNCOPE  Explantation Comments: 05/04/10 Medtronic 7278/[id number] h explanted  ICD Follow Up ICD Dependent:  No      Episodes Coumadin:  Yes  Brady Parameters Mode DDDR     Lower Rate Limit:  60     Upper Rate Limit 140 PAV 150     Sensed AV Delay:  120  Tachy Zones VF:  200     VT:  250     VT1:  171

## 2010-06-07 NOTE — Progress Notes (Addendum)
Summary: needs order for mammogram   Phone Note Call from Patient   Caller: Patient Call For: [name] MD Summary of Call: Patient needs an order faxed to to [facility] for her yearly mammogram. She wants this faxed to them and she will make her own appt.  Initial call taken by: Lacretia Nicks,  May 24, 2010 4:21 PM  New Problems: OTHER SCREENING MAMMOGRAM (ICD-V76.12)   New Problems: OTHER SCREENING MAMMOGRAM (ICD-V76.12)  Appended Document: needs order for mammogram  Spoke with someone from [facility], patient was scheduled to have mammogram done on 02/20 and she cancelled.

## 2010-06-07 NOTE — Progress Notes (Signed)
Summary: Rx Alprazolam  Phone Note Refill Request Call back at [phone] Message from:  [facility] on May 14, 2010 10:25 AM  Refills Requested: Medication #1:  ALPRAZOLAM 0.25 MG TABS 1 tab every night as needed for anxiety.   Last Refilled: 04/17/2010  Method Requested: Telephone to Pharmacy Initial call taken by: Emelia Salisbury LPN,  January  9, [id number] 10:25 AM  Follow-up for Phone Call        Rx called to pharmacy Follow-up by: Sherrian Divers CMA Deborra Medina),  May 14, 2010 10:39 AM    Prescriptions: ALPRAZOLAM 0.25 MG TABS (ALPRAZOLAM) 1 tab every night as needed for anxiety.  #30 x 0   Entered and Authorized by:   [name] MD   Signed by:   [name] MD on 05/14/2010   Method used:   Telephoned to ...       [facility] (retail)       [address]       [place], [place]  [address]       Ph: [phone]       Fax: [phone]   RxID:   [id number]

## 2010-06-07 NOTE — Medication Information (Signed)
Summary: Coumadin Clinic  Anticoagulant Therapy  Managed by: Inactive Referring MD: Glori Bickers, MD PCP: [name] MD Supervising MD: Rockey Situ Indication 1: Atrial Fibrillation (ICD-427.31) Lab Used: [facility] Site:  INR RANGE 2.0-3.0          Comments: Pt is now having Coumadin managed by Dr Deborra Medina [facility]. Freddrick March RN  April 25, 2010 3:35 PM   Allergies: 1)  ! Codeine 2)  ! Sulfa  Anticoagulation Management History:      Positive risk factors for bleeding include an age of 6 years or older and history of GI bleeding.  The bleeding index is 'intermediate risk'.  Negative CHADS2 values include Age > 68 years old.  The start date was 12/17/2005.  Her last INR was 2.5.  Anticoagulation responsible provider: Esperansa Sarabia.  Exp: 02/2011.    Anticoagulation Management Assessment/Plan:      The patient's current anticoagulation dose is Warfarin sodium 5 mg tabs: Use as directed by Anticoagulation Clinic.  The target INR is 2.0-3.0.  The next INR is due 4 weeks.  Anticoagulation instructions were given to patient.  Results were reviewed/authorized by Inactive.         Prior Anticoagulation Instructions: INR 2.2  Continue taking 1/2 tab daily. Recheck in 4 weeks.

## 2010-06-07 NOTE — Cardiovascular Report (Signed)
Summary: Office Visit   Office Visit   Imported By: Sallee Provencal 05/25/2010 16:23:12  _____________________________________________________________________  External Attachment:    Type:   Image     Comment:   External Document

## 2010-06-08 DIAGNOSIS — I369 Nonrheumatic tricuspid valve disorder, unspecified: Secondary | ICD-10-CM

## 2010-06-08 LAB — CBC
HCT: 35.6 % — ABNORMAL LOW (ref 36.0–46.0)
Hemoglobin: 12 g/dL (ref 12.0–15.0)
MCH: 30.2 pg (ref 26.0–34.0)
MCHC: 33.7 g/dL (ref 30.0–36.0)
MCV: 89.4 fL (ref 78.0–100.0)
Platelets: 196 10*3/uL (ref 150–400)
RBC: 3.98 MIL/uL (ref 3.87–5.11)
RDW: 16.8 % — ABNORMAL HIGH (ref 11.5–15.5)
WBC: 10.8 10*3/uL — ABNORMAL HIGH (ref 4.0–10.5)

## 2010-06-08 LAB — CARDIAC PANEL(CRET KIN+CKTOT+MB+TROPI)
CK, MB: 1.1 ng/mL (ref 0.3–4.0)
CK, MB: 3.1 ng/mL (ref 0.3–4.0)
Relative Index: 2.2 (ref 0.0–2.5)
Relative Index: INVALID (ref 0.0–2.5)
Total CK: 141 U/L (ref 7–177)
Total CK: 20 U/L (ref 7–177)
Troponin I: 0.02 ng/mL (ref 0.00–0.06)
Troponin I: 0.03 ng/mL (ref 0.00–0.06)

## 2010-06-08 LAB — BASIC METABOLIC PANEL
BUN: 39 mg/dL — ABNORMAL HIGH (ref 6–23)
CO2: 29 mEq/L (ref 19–32)
Calcium: 8.3 mg/dL — ABNORMAL LOW (ref 8.4–10.5)
Chloride: 97 mEq/L (ref 96–112)
Creatinine, Ser: 1.9 mg/dL — ABNORMAL HIGH (ref 0.4–1.2)
GFR calc Af Amer: 31 mL/min — ABNORMAL LOW (ref >60)
GFR calc non Af Amer: 26 mL/min — ABNORMAL LOW (ref >60)
Glucose, Bld: 125 mg/dL — ABNORMAL HIGH (ref 70–99)
Potassium: 5.3 mEq/L — ABNORMAL HIGH (ref 3.5–5.1)
Sodium: 136 mEq/L (ref 135–145)

## 2010-06-08 LAB — PROTIME-INR
INR: 1.24 (ref 0.00–1.49)
Prothrombin Time: 15.8 seconds — ABNORMAL HIGH (ref 11.6–15.2)

## 2010-06-13 ENCOUNTER — Telehealth: Payer: Self-pay | Admitting: Family Medicine

## 2010-06-14 NOTE — Op Note (Signed)
NAMECHASYA, HEO NO.:  [id number]  MEDICAL RECORD NO.:  [id number]          PATIENT TYPE:  OIB  LOCATION:  6524                         FACILITY:  [facility]  PHYSICIAN:  Champ Mungo. Lovena Le, MD    DATE OF BIRTH:  1933/07/27  DATE OF PROCEDURE:  05/04/2010 DATE OF DISCHARGE:                              OPERATIVE REPORT   PROCEDURE PERFORMED:  Insertion of a new ICD lead, removal of a previous implanted Medtronic dual-chamber ICD which reached ERI ICD pocket revision and insertion of a new dual-chamber ICD with defibrillation threshold testing.  INTRODUCTION:  The patient is a 77 year old woman with a hypertrophic cardiomyopathy who underwent ICD insertion at [facility] many years ago.  She has subsequently reached elective replacement indication.  Prior to her ICD implantation had a history of ventricular tachycardia and had been maintained on Disopyramide along with beta blockers.  She is now referred for insertion of a new device and because of her 6949 lead, because of her need for ongoing pacing, it was deemed most appropriate to place a new defibrillator lead.  PROCEDURE IN DETAILS:  After informed consent was obtained, the patient was taken to the diagnostic EP lab in a fasting state.  After usual preparation and draping, intravenous fentanyl and midazolam was given for sedation.  Lidocaine 30 mL was infiltrated into the left infraclavicular region.  A 7-cm incision was carried out over this region and electrocautery utilized to dissect down to the fascial plane. The left subclavian vein was then punctured.  The Medtronic Sprint Quattro Secure model [id number] 58-cm active fixation defibrillation lead serial number [id number] was advanced into the right ventricle.  Care was taken to make sure that the pacing lead did not come in contact with the old defibrillator lead.  It was actively fixed and the R-waves were 8 mV.  The threshold was less  than a volt at 0.4 milliseconds and the pacing impedance was 500 ohms.  10 volt pacing did not stimulate the diaphragm and a satisfactory injury current was present with active fixation of the lead.  With these satisfactory parameters, the lead was secured to subpectoralis fascia with a figure-of-eight silk suture and a sewing sleeve was also secured with silk suture.  At this point, electrocautery was utilized to enter the ICD pocket and the ICD generator was removed with gentle traction.  The old defibrillator lead was disconnected from the old defibrillator and it was capped.  The pocket was again irrigated and the new Medtronic Protecta dual-chamber ICD serial number [id number] was connected to the old atrial and new defibrillator leads and placed back in the subcutaneous pocket.  The pocket was irrigated with antibiotic irrigation and the patient was sedated for defibrillation threshold testing.  After the patient was more sedated with fentanyl and Versed, VF was induced with a T-wave shock.  A 15-joule shock was subsequently delivered which terminated the ventricular fibrillation and restored sinus rhythm.  At this point no additional defibrillation threshold testing was carried out and the incision was closed with 2-0 and 3-0 Vicryl.  Benzoin and Steri-Strips were painted on the skin.  A pressure dressing was applied and the patient was returned to her room in satisfactory condition.  COMPLICATIONS:  There were no immediate procedure complications.  RESULTS:  Demonstrate successful insertion of a new Medtronic dual coil ICD lead followed by capping of the old ICD lead which was a 6949 lead in a patient with prior VT and near pacemaker dependence, followed by pocket revision, followed by insertion of a new ICD with defibrillation threshold testing.     Champ Mungo. Lovena Le, MD     GWT/MEDQ  D:  05/04/2010  T:  05/04/2010  Job:  [id number]  Electronically Signed by Cristopher Peru  MD on 06/14/2010 05:05:26 PM

## 2010-06-19 ENCOUNTER — Encounter: Payer: Self-pay | Admitting: Family Medicine

## 2010-06-20 ENCOUNTER — Other Ambulatory Visit: Payer: Self-pay

## 2010-06-20 NOTE — Discharge Summary (Signed)
[name], [name] NO.:  [id number]  MEDICAL RECORD NO.:  [id number]           PATIENT TYPE:  I  LOCATION:  [id number]                         FACILITY:  [facility]  PHYSICIAN:  Estill Cotta, MD       DATE OF BIRTH:  Mar 23, 1934  DATE OF ADMISSION:  06/02/2010 DATE OF DISCHARGE:                              DISCHARGE SUMMARY   PRIMARY CARE PHYSICIAN:  Dr. Lanae Boast with [facility] Cardiologist is Dr. Haroldine Laws with [facility].  DISCHARGE DIAGNOSES: 1. Mechanical fall with right femoral fracture. 2. Status post cannulated screw fixation of the right femoral neck on     June 05, 2010 by Dr. Lorin Mercy. 3. Pleuritic chest pain improved. 4. Known coronary artery disease with cardiomyopathy, status post AICD     and pacemaker. 5. Hypertension. 6. Hyperlipidemia. 7. History of renal cell carcinoma in remission, status post right     nephrectomy. 8. Mild acute renal insufficiency.  CONSULTATIONS:1. Orthopedic Dr. Lorin Mercy. 2. Cardiology Dr. Cristopher Peru.  DISCHARGE MEDICATIONS: 1. Tylenol Extra Strength 500 mg p.o. t.i.d. 2. Colace 100 mg p.o. b.i.d. 3. Robaxin 500 mg p.o. t.i.d. 4. Percocet 5/25 mg every 4 hours as needed for pain. 5. MiraLax 17 grams p.o. daily as needed for constipation. 6. Tylenol 325 mg 1-2 tablets p.o. every 4 hours as needed. 7. Aspirin enteric-coated 81 mg p.o. daily. 8. Coreg 12.5 mg p.o. b.i.d. 9. Disopyramide 150 mg p.o. b.i.d. 10.Lipitor 40 mg p.o. daily. 11.Ultram 50 mg p.o. every 12 hours as needed. 12.Verapamil SR 180 mg daily. 13.Coumadin 2.5 mg daily, except Tuesday and Thursday when she takes 5     mg p.o. daily. 14.Xanax 0.25 mg every 8 hours as needed for anxiety.  HISTORY OF PRESENT ILLNESS:  At the time of admission [name] is a 77 year old female who was doing grocery shopping on the day of admission with her grandson.  After she went out of the store her foot got caught in the grocery coat and she  fell.  The patient managed to get up and pull herself to the car and then called her son who came in and assisted the patient to return home.  She sustained a fall around 4 o'clock  on the day of admission and from that time until 8 o'clock she was trying to ambulate around the house with the assistance of a walker. However, the pain continued to get worse and the patient experienced some nausea and lightheadedness and presented to the emergency department for evaluation.  X-ray of the hip showed minimally displaced subcapital femoral fracture of the right leg.  Also the patient had AICD and permanent transvenous pacemaker implantation by Dr. Cristopher Peru a few weeks prior to admission for bradycardia and ischemic cardiomyopathy.  RADIOLOGICAL DATA:  Right hip x-ray minimally displaced right side subcapital femur fracture June 02, 2010.  May 13, 2010 right femur x-ray minimally displaced right subcapital femur fracture.  Four-view knee x-ray on January 28 showed no acute findings.  Chest x-ray two-view January 28, hyperexpansion without other acute findings.  Right hip x- ray in good appearance following pin placement for  femoral neck fracture.  CT angiogram of the chest on June 07, 2010. 1. Negative for acute PE. 2. Coronary and aortic calcification. 3. Bilateral pleural effusion and dependent atelectasis in the lower     lobes. A 2-D echocardiogram has been completed, but the results are pending at the time of my dictation.  BRIEF HOSPITALIZATION COURSE:  [name] is 77 year old female who initially presented after a mechanical fall with right hip fracture. 1. Right femoral fracture.  The patient was admitted to the medicine     service and orthopedics was consulted.  Aspirin and Coumadin were     held and the patient underwent the surgery on June 05, 2010     after the clearance from Cardiology.  The patient has done well     postoperatively and requires rehab for  the right femoral neck     fracture.  She is postop day #4.  She is currently on Coumadin,     which also helps with the DVT prophylaxis. 2. Atypical chest pain, which has resolved today.  However, has a     pleuritic component as well.  Cardiac enzymes were obtained     yesterday which were negative for any acute ACS.  D-dimer was     elevated 4.3, likely pus from the surgery because the CT angiogram     of the chest was obtained, which was negative for any acute PE.  A     2-D echo has been completed and pending the results.  To look for     any pericardial effusion the patient recently had permanent     transvenous pacemaker and AICD implantation by Dr. Cristopher Peru few     weeks ago.  Per Dr. Tanna Furry recommendation today after my     discussion with him, if the patient does not have any moderate-to-     severe pericardial effusion, she can be safely discharged to the     skilled nursing facility and he will follow up within next 1-2     weeks post discharge. 3. Hypertension, remained controlled. 4. Mild acute renal insufficiency.  Hydrochlorothiazide and Lasix has     been discontinued from the patient's profile.  She should have a     repeat renal panel checked on Monday June 11, 2010. 5. History of renal cell carcinoma, currently in remission, status     post right nephrectomy.  PHYSICAL EXAMINATION:  VITAL SIGNS:  At the time of my dictation blood pressure 131/72, temperature 98.3, pulse 65, respirations 99.  O2 sats 98% on room air. GENERAL:  The patient is alert, awake and oriented. CVS:  S1, S2, clear.  Regular rate and rhythm. CHEST:  Deceased breath sounds at the bases, otherwise fairly clear. ABDOMEN: Soft, nontender, nondistended.  No bowel sounds. EXTREMITIES: No cyanosis or clubbing noted. NEUROLOGIC:  No focal or neurological deficits noted.  DISCHARGE FOLLOW-UP:  If the patient is discharged today, she will follow up with Dr. Rodell Perna within next 10-14 days  and Dr. Cristopher Peru within next 1-2 weeks.  DISCHARGE TIME:  35 minutes.     Estill Cotta, MD     RR/MEDQ  D:  06/08/2010  T:  06/08/2010  Job:  [id number]  cc:   [name] Mungo. Lovena Le, MD Michelene Gardener C. Lorin Mercy, M.D.  Electronically Signed by Nira Conn Lyndell Gillyard  on 06/20/2010 03:08:26 PM

## 2010-06-20 NOTE — Discharge Summary (Signed)
  NAMERACHAL, KRAAI             ACCOUNT NO.:  [id number]  MEDICAL RECORD NO.:  [id number]           PATIENT TYPE:  I  LOCATION:  [id number]                         FACILITY:  [facility]  PHYSICIAN:  Estill Cotta, MD       DATE OF BIRTH:  Nov 10, 1933  DATE OF ADMISSION:  06/02/2010 DATE OF DISCHARGE:                              DISCHARGE SUMMARY   ADDENDUM  The patient's 2-D echocardiogram is done and per the results, EF is 60- 123456 and systolic function is normal.  There is no pericardial effusion. Dr. Cristopher Peru was notified and the patient will follow up with Dr. Lovena Le within next 1 to 2 weeks postdischarge.  The patient will be discharged to [facility] for rehab today.     Estill Cotta, MD     RR/MEDQ  D:  06/08/2010  T:  06/08/2010  Job:  [id number]  cc:   Champ Mungo. Lovena Le, MD Michelene Gardener C. Lorin Mercy, M.D.  Electronically Signed by Nira Conn Corbitt Cloke  on 06/20/2010 03:08:35 PM

## 2010-06-21 ENCOUNTER — Telehealth: Payer: Self-pay | Admitting: Family Medicine

## 2010-06-21 ENCOUNTER — Ambulatory Visit: Payer: Self-pay

## 2010-06-21 NOTE — Progress Notes (Signed)
Summary: alprazolam   Phone Note Refill Request Message from:  Fax from Pharmacy on June 13, 2010 11:41 AM  Refills Requested: Medication #1:  ALPRAZOLAM 0.25 MG TABS 1 tab every night as needed for anxiety.   Last Refilled: 05/14/2010 Refill request from [facility]. [id number].  Initial call taken by: Lacretia Nicks,  June 13, 2010 11:42 AM  Follow-up for Phone Call        Rx called to pharmacy Follow-up by: Sherrian Divers CMA Deborra Medina),  June 13, 2010 12:28 PM    Prescriptions: ALPRAZOLAM 0.25 MG TABS (ALPRAZOLAM) 1 tab every night as needed for anxiety.  #30 x 0   Entered and Authorized by:   [name] MD   Signed by:   [name] MD on 06/13/2010   Method used:   Telephoned to ...       [facility] (retail)       [address]       [place], [place]  [address]       Ph: [phone]       Fax: [phone]   RxID:   [id number]

## 2010-06-25 NOTE — Consult Note (Signed)
NAMEMADDEX, FICHTNER NO.:  [id number]  MEDICAL RECORD NO.:  [id number]           PATIENT TYPE:  LOCATION:                                 FACILITY:  PHYSICIAN:  Deboraha Sprang, MD, FACCDATE OF BIRTH:  1933-08-09  DATE OF CONSULTATION:  06/03/2010 DATE OF DISCHARGE:                                CONSULTATION   PRIMARY CARE PHYSICIAN:  [name], MD, with [facility].  PRIMARY CARDIOLOGIST:  Shaune Pascal. Bensimhon, MD  CHIEF COMPLAINT:  Defibrillator management for surgery.  HISTORY OF PRESENT ILLNESS:  Ms. Odekirk is a 77 year old female with a history of syncope requiring an ICD who fell and has a femur fracture. She needs surgical repair and Cardiology was asked to assess her and advice on ICD management.  Ms. Weschler was in her usual state of health yesterday.  She was going to the car with a grocery cart and states she became tangled up in the cart and fell, fracturing her upper femur.  She denies loss of consciousness.  She had no chest pain or shortness of breath.  There were no palpitations, and her ICD has not fired.  She feels that her respiratory status is at baseline, although she had significant dyspnea on exertion chronically.  Currently, she is nauseated secondary to pain control medications but otherwise resting pretty comfortably.  PAST MEDICAL HISTORY: 1. Status post echocardiogram in March 2011 showing an EF of 55% to     60%, moderate focal basal hypertrophy and increased left     ventricular filling pressures but no dynamic outflow obstruction     was seen. 2. Syncope in October 2006, the patient had no prodrome, dynamic left     ventricular outflow tract obstruction during Valsalva with a peak     gradient of 174 mmHg, hypotension with exercise, status post ICD. 3. Paroxysmal atrial fibrillation. 4. Chronic anticoagulation with Coumadin. 5. Remote history of tobacco. 6. History of hyperlipidemia. 7. History of renal cell  carcinoma with subsequent chronic kidney     disease, stage III. 8. Nonobstructive coronary artery disease by catheterization in 2006     and 2009. 9. COPD. 10.Cerebrovascular disease with left 40% to 50% and right 60% to 79%     in 2010. 11.History of sinus bradycardia.  PAST SURGICAL HISTORY:  She is status post ICD in 2006 with a change to a Medtronic Protecta dual-chamber ICD and a new right ventricular lead to replace the 6949 previous right ventricular lead as well as cardiac catheterizations in the right renal resection.  ALLERGIES:  CODEINE and SULFA.  CURRENT MEDICATIONS: 1. Biotene rinse b.i.d. 2. Coreg 12.5 mg b.i.d. 3. Norpace 150 mg b.i.d. 4. Hydrochlorothiazide 25 mg a day. 5. Crestor 5 mg a day. 6. IV fluids KVO. 7. Verapamil 180 mg a day. 8. P.r.n. medications.  SOCIAL HISTORY:  She lives in [place] alone.  Family is nearby.  She quit tobacco in 2003 and has no history of alcohol or drug abuse.  FAMILY HISTORY:  Her mother lived into her 59s but had a pacemaker and a history of syncope.  Her father died  at 30 of a motor vehicle accident and no siblings have coronary artery disease.  REVIEW OF SYSTEMS:  She has chronic dyspnea on exertion and her activity is fairly limited by this.  She gets leg pain with ambulation.  She has not had any recent illnesses, fevers, or chills.  She has not had any bleeding issues or problems.  She is symptomatic with pain in her right lower extremity from the fall but no other significant injuries secondary to that.  She has not had any recent lower extremity edema or palpitations and no recent ICD shocks.  Full 14-point review of systems is otherwise negative except as stated in the HPI.  PHYSICAL EXAMINATION:  VITAL SIGNS:  Temperature is 98.6, blood pressure 124/73, heart rate 61, respiratory rate 18, O2 saturation 99% on room air. GENERAL:  She is a well-developed elderly white female in mild distress secondary to  nausea. HEENT:  Normal. NECK:  There is no lymphadenopathy or thyromegaly noted.  She has minimal JVD and bilateral carotid bruits are noted. CHEST:  She has some rales in the bases.  No crackles or wheezes are noted. CVA:  Heart is regular in rate and rhythm with systolic murmur noted. ABDOMEN:  Soft and slightly diffusely tender.  Bowel sounds are present, and there is no guarding or rebound. EXTREMITIES:  She has decreased distal pulses but they are warm and capillary refill is within normal limits.  There is no cyanosis, clubbing, or edema noted.  There are no joint effusions.  Her right femur in the upper portion is very tender and not palpated. SKIN:  No rashes or lesions are noted. NEUROLOGIC:  She is alert and oriented with no focal deficits noted.  LABORATORY VALUES:  Hemoglobin 13.1, hematocrit 39.9, WBCs 8.8, platelets 155,000.  INR 2.85.  Sodium 135, potassium 3.2, chloride 98, CO2 26, BUN 22, creatinine 1.39, glucose 146.  Chest x-ray shows hyperexpansion without other findings.  Right femur x-ray shows minimally displaced right subcapital femur fracture.  EKG is AV pacing.  IMPRESSION:  Ms. Boutin was seen today by Dr. Caryl Comes, the patient evaluated and the data reviewed. 1. Femur fracture. 2. PAF. 3. Chronic anticoagulation with coumadin. 4. Syncope with hypotension during exercise status post implantable     cardioverter-defibrillator implant, Medtronic device 5. Change out of 6949 lead with a new lead and subsequent gradually     improving left-sided chest pain.  RECOMMENDATIONS: 1. She is at acceptable risk for surgery. 2. Vitamin K with no bridging of Lovenox or heparin to therapeutic     Coumadin after surgery should be an acceptable risk. 3. Volume repletion is important with syncope. 4. Loss of V-pacing during the procedure may result in an increase in     her left ventricular outflow tract gradient and make volume     repletion status more important.   We will interrogate her ICD in     a.m. but she is pacing 95% in the time and that it is appropriate.     Rosaria Ferries, PA-C   ______________________________ Deboraha Sprang, MD, [facility]    RB/MEDQ  D:  06/03/2010  T:  06/03/2010  Job:  [id number]  Electronically Signed by Rosaria Ferries PA-C on 06/23/2010 08:12:01 AM Electronically Signed by Virl Axe MD [place] on 06/25/2010 10:00:02 PM

## 2010-06-27 NOTE — Progress Notes (Signed)
Summary: protime   Phone Note Call from Patient   Caller: [name] [id number] Call For: [name] MD Summary of Call: [facility] is [facility] patient's home right now and is asking if she could do patient's protime while she is there so that the patient doesn't have to come to our office today. She will need verbal order. Please advise.  Initial call taken by: Lacretia Nicks,  June 21, 2010 9:04 AM  Follow-up for Phone Call        yes that is ok. [name] MD  June 21, 2010 9:21 AM  [name] notified. Will call with results.   Follow-up by: Lacretia Nicks,  June 21, 2010 9:25 AM

## 2010-06-27 NOTE — Progress Notes (Signed)
Summary: PT/INR  Phone Note Call from Patient   Caller: [name] w/ [facility] Call For: [name] MD Summary of Call: Patient's INR was 1.9 and PT was 22.4. Please call patient with instructions and also leeve message with [name].  [id number] Initial call taken by: Lacretia Nicks,  June 21, 2010 10:17 AM  Follow-up for Phone Call        please verify dose she is taking.  She should be taking 5 mg coumadin Tuesday, Thursday and 2.5 mg all other days. If so, please increase to 5 mg Tues, Thursday, Saturday.  2.5 mg all other days.  Recheck in 4 weeks. [name] MD  June 21, 2010 10:20 AM  Patient notified via telephone as instructed.  Left a message on cell phone voicemail advising [name] as well.  Follow-up by: Sherrian Divers CMA Deborra Medina),  June 21, 2010 10:37 AM

## 2010-06-27 NOTE — H&P (Signed)
[name], [name] NO.:  [id number]  MEDICAL RECORD NO.:  [id number]           PATIENT TYPE:  LOCATION:                                 FACILITY:  PHYSICIAN:  Arlyss Repress, MD        DATE OF BIRTH:  13-Dec-1933  DATE OF ADMISSION: DATE OF DISCHARGE:                             HISTORY & PHYSICAL   DATE OF ADMISSION:  June 02, 2010.  PRIMARY CARE PHYSICIAN:  Dr. Marjory Lies with [facility].  CARDIOLOGIST:  Shaune Pascal. Bensimhon, MD, with [facility].  ALLERGIES:  SULFA and CODEINE.  CHIEF COMPLAINT:  Right hip pain after a fall.  HISTORY OF PRESENT ILLNESS:  This is a 77 year old Caucasian female who was doing grocery shopping today with her grandson.  After she went outside of the store, her foot got caught into the grocery cart and patient fell.  She managed to get up and pull herself to the car, called her son who came and assisted the patient to return home.  She sustained the fall around 4 o'clock in the afternoon, and from that time until 8 o'clock in the evening, she was trying to ambulate around the house with the assistance of a walker.  However, the pain was increasingly worse, and the patient also experienced some waves of nausea and lightheadedness and decided to come to the emergency department for the evaluation.  The x-ray of the hip in the emergency department showed a minimally displaced subcapital femoral fracture of the right leg.  PAST MEDICAL HISTORY:  Significant for coronary artery disease, atrial fibrillation and patient is on Coumadin, her INR on admission is 2.85. She also has a history of bradycardia and ischemic cardiomyopathy and underwent a permanent transvenous pacemaker and AICD implantation by Dr. Cristopher Peru few weeks ago.  She has a history of hypertension, syncope, hyperlipidemia, bilateral carotid bruits, COPD with a history of smoking.  The patient has only one kidney and underwent  right-sided nephrectomy secondary to renal carcinoma.  SOCIAL HISTORY:  She is a widow, has 2 daughters, 2 grandsons and multiple great-grandchildren.  She retired from Medical sales representative work.  She is an ex-smoker.  Does not drink alcohol and does not use illicit drugs.  ALLERGIES:  CODEINE and SULFA.  CODE STATUS:  Full.  HOME MEDICATIONS: 1. Norpace 150 mg b.i.d. 2. Coreg 12.5 mg b.i.d. 3. Warfarin 5 mg as directed. 4. Aspirin 81 mg daily. 5. HCTZ 25 mg daily. 6. Zocor 20 mg at bedtime. 7. Verapamil 180 mg daily. 8. Alprazolam 0.25 mg p.r.n. 9. Furosemide 20 mg p.r.n. 10.Klor-Con 20 mEq along with Lasix.  REVIEW OF SYSTEMS:  The patient complained of some moderate pain in the right hip, currently is on fentanyl drip.  She denied any significant complaints of shortness of breath, chest pain, dizziness or lightheadedness, but said that at home, prior to her presentation to the ER, she was feeling nauseated, weak and short of breath and dizzy when she was trying to ambulate with the walker.  All other review of systems are negative.  PHYSICAL EXAMINATION:  VITAL SIGNS:  Her blood pressure  on arrival was 154/62, and now, blood pressure is 135/84, heart rate 70, temperature is 97.0, respirations are 16, and pulse oximetry 98%. HEENT:  Normocephalic, atraumatic.  Extraocular movements intact. NECK:  Bilateral carotid bruits, but no JVD. LUNGS:  Auscultation revealed clear fields.  No murmurs, gallops or rubs.  Upper left side of the chest carries the scar from the recent pacemaker implantation. ABDOMEN:  Soft, nontender, nondistended with active bowel sounds throughout.  No hepatomegaly.  No masses appreciated.  EXTREMITIES: Lower extremities without any significant edema bilaterally.  She has palpable pedal pulses, no swelling.  The right hip has some mild swelling and slight bluish discoloration, tender to touch. SKIN:  Without any obviously visible rashes, open sores or  other lesions.  Dry and warm to touch. NEUROLOGIC:  No focal abnormalities and no sensory deficit observed.  LABORATORY DATA:  CBC showed white blood cell count of 8.8, hemoglobin 13.1, hematocrit 39.9, and platelet count 155.  BMP revealed sodium of 138, potassium 3.8, chloride 97, carbon dioxide 29, glucose 116, BUN 24, creatinine 1.42, and calcium 9.7.  PT 30, INR 2.85.  Chest x-ray showed hyperexpansion of the lungs without other acute findings.  Right hip x- ray showed minimally displaced right-sided subcapital femoral fracture.  IMPRESSION AND PLAN: 1. Right femoral fracture.  Ortho service will see the patient, and we     will hold her Coumadin and aspirin.  Continue following PT and INR.     Heparin can be started when INR is less or equal to 2.0. 2. Known coronary artery disease with cardiomyopathy, status post     PTVPM/AICD.  If cardiology clearance needed for surgery, [place]     [facility] will be contacted. 3. Hypertension.  We will continue home medications and adjust them if     needed. 4. Hyperlipidemia.  The patient will continue the same statin therapy.     She will be on a heart-healthy diet. 5. History of renal cell carcinoma, in remission, status post right     nephrectomy. 6. Mild elevation of BUN and creatinine.  We will hydrate patient and     follow up renal function.  All other recommendations are per Ortho service and rounding team in the morning.     York Grice, P.A.   ______________________________ Arlyss Repress, MD    MK/MEDQ  D:  06/03/2010  T:  06/03/2010  Job:  [id number]  cc:   Lanae Boast  Electronically Signed by York Grice P.A. on 06/06/2010 07:31:10 PM Electronically Signed by Jani Gravel MD on 06/27/2010 01:21:49 PM

## 2010-06-28 ENCOUNTER — Ambulatory Visit: Payer: Self-pay | Admitting: Internal Medicine

## 2010-07-10 ENCOUNTER — Telehealth: Payer: Self-pay | Admitting: Family Medicine

## 2010-07-12 NOTE — Letter (Signed)
Summary: [facility]   Imported By: Phillis Knack 07/02/2010 10:09:07  _____________________________________________________________________  External Attachment:    Type:   Image     Comment:   External Document

## 2010-07-16 ENCOUNTER — Encounter: Payer: Self-pay | Admitting: Family Medicine

## 2010-07-16 DIAGNOSIS — S72009D Fracture of unspecified part of neck of unspecified femur, subsequent encounter for closed fracture with routine healing: Secondary | ICD-10-CM

## 2010-07-16 DIAGNOSIS — R269 Unspecified abnormalities of gait and mobility: Secondary | ICD-10-CM

## 2010-07-16 DIAGNOSIS — I10 Essential (primary) hypertension: Secondary | ICD-10-CM

## 2010-07-16 DIAGNOSIS — I4891 Unspecified atrial fibrillation: Secondary | ICD-10-CM

## 2010-07-16 LAB — BASIC METABOLIC PANEL
BUN: 25 mg/dL — ABNORMAL HIGH (ref 6–23)
CO2: 29 mEq/L (ref 19–32)
Calcium: 9.5 mg/dL (ref 8.4–10.5)
Chloride: 101 mEq/L (ref 96–112)
Creatinine, Ser: 1.69 mg/dL — ABNORMAL HIGH (ref 0.4–1.2)
GFR calc Af Amer: 36 mL/min — ABNORMAL LOW (ref >60)
GFR calc non Af Amer: 30 mL/min — ABNORMAL LOW (ref >60)
Glucose, Bld: 102 mg/dL — ABNORMAL HIGH (ref 70–99)
Potassium: 3.5 mEq/L (ref 3.5–5.1)
Sodium: 138 mEq/L (ref 135–145)

## 2010-07-16 LAB — CBC
HCT: 39.1 % (ref 36.0–46.0)
Hemoglobin: 13 g/dL (ref 12.0–15.0)
MCH: 29.8 pg (ref 26.0–34.0)
MCHC: 33.2 g/dL (ref 30.0–36.0)
MCV: 89.7 fL (ref 78.0–100.0)
Platelets: 180 10*3/uL (ref 150–400)
RBC: 4.36 MIL/uL (ref 3.87–5.11)
RDW: 13.5 % (ref 11.5–15.5)
WBC: 4.6 10*3/uL (ref 4.0–10.5)

## 2010-07-16 LAB — SURGICAL PCR SCREEN
MRSA, PCR: NEGATIVE
Staphylococcus aureus: POSITIVE — AB

## 2010-07-16 LAB — PROTIME-INR
INR: 2.17 — ABNORMAL HIGH (ref 0.00–1.49)
INR: 2.62 — ABNORMAL HIGH (ref 0.00–1.49)
Prothrombin Time: 24.3 seconds — ABNORMAL HIGH (ref 11.6–15.2)
Prothrombin Time: 28.1 seconds — ABNORMAL HIGH (ref 11.6–15.2)

## 2010-07-16 LAB — APTT: aPTT: 47 seconds — ABNORMAL HIGH (ref 24–37)

## 2010-07-17 NOTE — Progress Notes (Signed)
Summary: refill request for alprazolam  Phone Note Refill Request Call back at [phone] Message from:  Fax from Pharmacy  Refills Requested: Medication #1:  ALPRAZOLAM 0.25 MG TABS 1 tab every night as needed for anxiety. Faxed request from [facility].  Pt's daughter also called, said she knows this is a couple of days early, but she needs to pick this up for the pt, pt has recently broken her hip, and daughter lives out of town so she needs to pick it up today.  Initial call taken by: Marty Heck CMA, [place],  July 10, 2010 9:20 AM  Follow-up for Phone Call        Rx called to pharmacy Follow-up by: Sherrian Divers CMA Deborra Medina),  July 10, 2010 10:07 AM    Prescriptions: ALPRAZOLAM 0.25 MG TABS (ALPRAZOLAM) 1 tab every night as needed for anxiety.  #30 x 0   Entered and Authorized by:   [name] MD   Signed by:   [name] MD on 07/10/2010   Method used:   Telephoned to ...       [facility] (retail)       [address]       [place], [place]  [address]       Ph: [phone]       Fax: [phone]   RxID:   [id number]

## 2010-07-19 ENCOUNTER — Encounter: Payer: Self-pay | Admitting: Internal Medicine

## 2010-07-20 ENCOUNTER — Encounter: Payer: Self-pay | Admitting: Family Medicine

## 2010-07-20 ENCOUNTER — Ambulatory Visit (INDEPENDENT_AMBULATORY_CARE_PROVIDER_SITE_OTHER): Payer: PRIVATE HEALTH INSURANCE

## 2010-07-20 DIAGNOSIS — I4891 Unspecified atrial fibrillation: Secondary | ICD-10-CM

## 2010-07-20 DIAGNOSIS — Z7901 Long term (current) use of anticoagulants: Secondary | ICD-10-CM

## 2010-07-20 DIAGNOSIS — Z5181 Encounter for therapeutic drug level monitoring: Secondary | ICD-10-CM

## 2010-07-20 LAB — CONVERTED CEMR LAB
INR: 2.3
Prothrombin Time: 27.4 s

## 2010-07-24 NOTE — Medication Information (Signed)
Summary: protime/tw  Anticoagulant Therapy  Managed by: Inactive Referring MD: Glori Bickers, MD PCP: [name] MD Supervising MD: Rockey Situ Indication 1: Atrial Fibrillation (ICD-427.31) Lab Used: [facility] Site: [place] PT 27.4 INR RANGE 2.0-3.0           Allergies: 1)  ! Codeine 2)  ! Sulfa  Anticoagulation Management History:      Positive risk factors for bleeding include an age of 75 years or older and history of GI bleeding.  The bleeding index is 'intermediate risk'.  Negative CHADS2 values include Age > 36 years old.  The start date was 12/17/2005.  Her last INR was 2.7 and today's INR is 2.3.  Prothrombin time is 27.4.  Anticoagulation responsible provider: gollan.  Exp: 02/2011.    Anticoagulation Management Assessment/Plan:      The patient's current anticoagulation dose is Warfarin sodium 5 mg tabs: Use as directed by Anticoagulation Clinic.  The target INR is 2.0-3.0.  The next INR is due 4 weeks.  Anticoagulation instructions were given to patient.  Results were reviewed/authorized by Inactive.         Prior Anticoagulation Instructions: INR 2.2  Continue taking 1/2 tab daily. Recheck in 4 weeks.    ANTICOAGULATION RECORD PREVIOUS REGIMEN & LAB RESULTS Anticoagulation Diagnosis:  Atrial Fibrillation (ICD-427.31) on  12/01/2008 Previous INR Goal Range:  2.0-3.0 on  03/13/2009 Previous INR:  2.7 on  05/23/2010 Previous Coumadin Dose(mg):  2.5mg  qd on  03/14/2010 Previous Regimen:  2.5mg  qd, 5mg  T,TH on  04/17/2010  NEW REGIMEN & LAB RESULTS Current INR: 2.3 Current Coumadin Dose(mg):  2.5mg  qd, 5mg  T,TH  Regimen:  2.5mg  qd, 5mg  T,TH   Provider: [place] Kreitzer Repeat testing in: 4 weeks Other Comments: FYI pt did not change coumadin dose 1 month ago, I left her dose the same since Inr was normal.  Dose has been reviewed with patient or caretaker during this visit. Reviewed by: Lavonna Rua  Anticoagulation Visit  Questionnaire Coumadin dose missed/changed:  No Abnormal Bleeding Symptoms:  No  Any diet changes including alcohol intake, vegetables or greens since the last visit:  No Any illnesses or hospitalizations since the last visit:  No Any signs of clotting since the last visit (including chest discomfort, dizziness, shortness of breath, arm tingling, slurred speech, swelling or redness in leg):  No  MEDICATIONS DISOPYRAMIDE PHOSPHATE 150 MG CAPS (DISOPYRAMIDE PHOSPHATE) 1 cap two times a day CARVEDILOL 12.5 MG TABS (CARVEDILOL) 1 tab two times a day WARFARIN SODIUM 5 MG TABS (WARFARIN SODIUM) Use as directed by Anticoagulation Clinic ASPIRIN 81 MG TBEC (ASPIRIN) Take one tablet by mouth daily HYDROCHLOROTHIAZIDE 25 MG TABS (HYDROCHLOROTHIAZIDE) Take one tablet by mouth daily. LIPITOR 40 MG TABS (ATORVASTATIN CALCIUM) Take one tablet by mouth daily. VERAPAMIL HCL CR 180 MG XR24H-CAP (VERAPAMIL HCL) Take one capsule by mouth daily ALPRAZOLAM 0.25 MG TABS (ALPRAZOLAM) 1 tab every night as needed for anxiety. FUROSEMIDE 20 MG TABS (FUROSEMIDE) Take one tablet by mouth daily as needed KLOR-CON M20 20 MEQ CR-TABS (POTASSIUM CHLORIDE CRYS CR) 1 tab by mouth daily with furosemide    Laboratory Results   Blood Tests   Date/Time Received: July 20, 2010 10:48 AM Date/Time Reported: July 20, 2010 10:48 AM  PT: 27.4 s   (Normal Range: 10.6-13.4)  INR: 2.3   (Normal Range: 0.88-1.12   Therap INR: 2.0-3.5)

## 2010-07-24 NOTE — Miscellaneous (Signed)
Summary: Certification and Plan of Care/[facility]  Certification and Plan of Care/[facility]   Imported By: Laural Benes 07/20/2010 14:30:23  _____________________________________________________________________  External Attachment:    Type:   Image     Comment:   External Document

## 2010-08-01 ENCOUNTER — Encounter: Payer: Self-pay | Admitting: Internal Medicine

## 2010-08-01 ENCOUNTER — Other Ambulatory Visit: Payer: Self-pay | Admitting: *Deleted

## 2010-08-02 MED ORDER — ALPRAZOLAM 0.25 MG PO TABS: 0.2500 mg | ORAL_TABLET | Freq: Every evening | ORAL | Status: DC | PRN

## 2010-08-02 NOTE — Telephone Encounter (Signed)
Rx called to pharmacy

## 2010-08-09 ENCOUNTER — Telehealth: Payer: Self-pay | Admitting: *Deleted

## 2010-08-09 DIAGNOSIS — Z7901 Long term (current) use of anticoagulants: Secondary | ICD-10-CM

## 2010-08-09 DIAGNOSIS — I4891 Unspecified atrial fibrillation: Secondary | ICD-10-CM

## 2010-08-09 DIAGNOSIS — Z5181 Encounter for therapeutic drug level monitoring: Secondary | ICD-10-CM

## 2010-08-09 NOTE — Telephone Encounter (Signed)
INR monitoring enrollment

## 2010-08-14 ENCOUNTER — Ambulatory Visit: Payer: PRIVATE HEALTH INSURANCE

## 2010-08-16 ENCOUNTER — Encounter: Payer: Self-pay | Admitting: Family Medicine

## 2010-08-17 ENCOUNTER — Ambulatory Visit (INDEPENDENT_AMBULATORY_CARE_PROVIDER_SITE_OTHER): Payer: Medicare Other | Admitting: Family Medicine

## 2010-08-17 ENCOUNTER — Encounter: Payer: Self-pay | Admitting: Family Medicine

## 2010-08-17 VITALS — BP 140/80 | HR 80 | Temp 97.7°F | Ht 67.5 in | Wt 170.0 lb

## 2010-08-17 DIAGNOSIS — Z5181 Encounter for therapeutic drug level monitoring: Secondary | ICD-10-CM

## 2010-08-17 DIAGNOSIS — Z2089 Contact with and (suspected) exposure to other communicable diseases: Secondary | ICD-10-CM

## 2010-08-17 DIAGNOSIS — Z7901 Long term (current) use of anticoagulants: Secondary | ICD-10-CM

## 2010-08-17 DIAGNOSIS — R059 Cough, unspecified: Secondary | ICD-10-CM | POA: Insufficient documentation

## 2010-08-17 DIAGNOSIS — Z23 Encounter for immunization: Secondary | ICD-10-CM

## 2010-08-17 DIAGNOSIS — I4891 Unspecified atrial fibrillation: Secondary | ICD-10-CM

## 2010-08-17 DIAGNOSIS — R05 Cough: Secondary | ICD-10-CM

## 2010-08-17 DIAGNOSIS — Z20818 Contact with and (suspected) exposure to other bacterial communicable diseases: Secondary | ICD-10-CM

## 2010-08-17 LAB — POCT INR: INR: 2.8

## 2010-08-17 MED ORDER — AZITHROMYCIN 250 MG PO TABS: ORAL_TABLET | ORAL | Status: AC

## 2010-08-17 NOTE — Progress Notes (Signed)
  Subjective:    Patient ID: [name], female    DOB: [birth year], 77 y.o.   MRN: [id number]  HPI CC: exposure to whooping cough  Pt exposed to 9yo great grandson with whooping cough, so she was advised to come in to be evaluated.  h/o sinus drainage, 3d h/o dry cough with tickle in back of throat.  Attributes this to sinusitis as has had this in past.  No ST.  No fevers/chills, n/v, sweating.  No smokers at home.  No h/o asthma/allergies.  H/o Td 2005, not Tdap.  Review of Systems Per HPI    Objective:   Physical Exam  [vitalsreviewed. Constitutional: She appears well-developed and well-nourished. No distress.  HENT:  Head: Normocephalic and atraumatic.  Right Ear: External ear normal.  Left Ear: External ear normal.  Nose: No mucosal edema or rhinorrhea. Right sinus exhibits no maxillary sinus tenderness and no frontal sinus tenderness. Left sinus exhibits no maxillary sinus tenderness and no frontal sinus tenderness.  Mouth/Throat: Uvula is midline, oropharynx is clear and moist and mucous membranes are normal. No oropharyngeal exudate.  Eyes: Conjunctivae and EOM are normal. Pupils are equal, round, and reactive to light. No scleral icterus.  Neck: Normal range of motion. Neck supple. No JVD present. No thyromegaly present.  Cardiovascular: Normal rate, regular rhythm, normal heart sounds and intact distal pulses.   No murmur heard. Pulmonary/Chest: Effort normal and breath sounds normal. No respiratory distress. She has no wheezes. She has no rales.  Lymphadenopathy:    She has no cervical adenopathy.  Skin: Skin is warm and dry. No rash noted.          Assessment & Plan:

## 2010-08-17 NOTE — Patient Instructions (Signed)
Tdap today. If cough worsening, or any fevers start zpack (provided handwritten today). Call us with questions.  Good to see you today.

## 2010-08-17 NOTE — Patient Instructions (Signed)
Continue current dose, check in 4 weeks  

## 2010-08-17 NOTE — Assessment & Plan Note (Signed)
Doubt pertussis however did have exposure.   WASP for zpack provided today. Did not outright treat because doesn't live in same household as contact, on warfarin.   Discussed sxs to watch for, if occur, start abx course. Tdap today.  Discussed won't protect her from current exposure, but will in future.

## 2010-08-20 ENCOUNTER — Ambulatory Visit: Payer: PRIVATE HEALTH INSURANCE

## 2010-08-27 ENCOUNTER — Encounter: Payer: Self-pay | Admitting: Internal Medicine

## 2010-08-27 ENCOUNTER — Other Ambulatory Visit: Payer: Self-pay | Admitting: Emergency Medicine

## 2010-08-27 MED ORDER — HYDROCHLOROTHIAZIDE 25 MG PO TABS: 25.0000 mg | ORAL_TABLET | Freq: Every day | ORAL | Status: DC

## 2010-08-27 NOTE — Telephone Encounter (Signed)
rx sent into pharmacy

## 2010-08-28 ENCOUNTER — Other Ambulatory Visit: Payer: Self-pay | Admitting: *Deleted

## 2010-08-28 MED ORDER — ALPRAZOLAM 0.25 MG PO TABS: 0.2500 mg | ORAL_TABLET | Freq: Every evening | ORAL | Status: DC | PRN

## 2010-08-28 NOTE — Telephone Encounter (Signed)
Rx called to pharmacy

## 2010-09-17 ENCOUNTER — Ambulatory Visit: Payer: Medicare Other

## 2010-09-18 NOTE — Assessment & Plan Note (Signed)
[facility] OFFICE NOTE   TIEISHA, VANBEEK                      MRN:          [id number]  DATE:12/04/2007                            DOB:          [birth year]    IDENTIFICATION:  [name] is a very pleasant 77 year old woman with a  history of hypertrophic cardiomyopathy associated with syncope and  complete heart block.  She also has chronic chest pain.  She returns  today for routine followup.   She recently underwent catheterization which showed nonobstructive  coronary artery disease and preserved pulmonary pressures.  She also had  an echocardiogram which showed an EF of 65-70% with a septal thickness  of 1.5.  There is no SAM or LV outflow tract gradient.  She was recently  underwent exercise test, which showed chronotropic incompetence and she  has a followup with the EP clinic and she has had her activity response  factor increase.   She feels like she is doing better after her pacemaker/defibrillator was  adjusted.  She is now walking 2 miles every day and is also mowing in  the lawn without any symptoms.  She does note that she had that 2  episodes of syncope.  She describes 1 where she just has walked around  the side of bed and lost consciousness just for a second.  No chest pain  or palpitations.  She had another similar episode, these were not  orthostatic.  She was seen in the EP device clinic and interrogation of  pacemaker showed no abnormalities.  She also continues to have some mild  occasional chest pain, which is not predictable but it is not associated  with exertion.   CURRENT MEDICATIONS:  1. Metoprolol 50 b.i.d.  2. Simvastatin 80 a day.  3. Aspirin 81.  4. Protonix 40 a day.  5. HCTZ 25 a day.  6. Disopyramide 150 b.i.d.  7. Verapamil 240 a day.  8. Coumadin.   PHYSICAL EXAMINATION:  GENERAL:  She is no acute distress.  She  ambulates around the clinic without any  respiratory difficulty.  VITAL SIGNS:  Blood pressure on sitting is 132/70, on standing 130/68,  heart rate 79, and weight 167.  HEENT:  Normal.  NECK:  Supple.  No JVD.  Carotids are 2+ bilateral without bruits.  There is no lymphadenopathy or thyromegaly.  CARDIAC:  She has a regular rate and rhythm.  No murmurs, rubs or  gallops.  There is no provokable murmur with Valsalva.  LUNGS:  Clear.  ABDOMEN:  Abdomen soft, nontender, nondistended.  No hepatosplenomegaly.  No bruits, no mass.  EXTREMITIES:  Warm with no cyanosis, clubbing or edema.  No rash.  NEURO:  Alert and oriented x3.  Cranial nerves II-XII are intact.  Moves  all 4 extremities without difficulty.  Affect is pleasant.   EKG shows AV pacing at a rate of 79.   ASSESSMENT AND PLAN:  1. Hypertrophic obstructive cardiomyopathy.  She is doing well.  She      has no evidence of gradient echocardiogram.  Her exercise tolerance  seems well-preserved.  2. Chest pain.  Catheterization showed nonobstructive coronary artery      disease.  I suspect this is not noncardiac chest pain; however, if      they persist, consider checking a CT scan.  3. Syncope.  I am unable to explain this.  She is not orthostatic.      She has not had any evidence on her monitor.  She does have an      implantable cardioverter-defibrillator in place.  We will continue      to follow.   DISPOSITION:  I will see her back in clinic in a month for a routine  followup and I told her to call me sooner if she continue to have any  problems.     Shaune Pascal. Bensimhon, MD  Electronically Signed    DRB/MedQ  DD: 12/04/2007  DT: 12/05/2007  Job #: [id number]

## 2010-09-18 NOTE — Procedures (Signed)
[facility]                              EXERCISE TREADMILL   [name], [name]                      MRN:          [id number]  DATE:06/03/2007                            DOB:          04-Sep-1933    PATIENT IDENTIFICATION:  [name] is a very pleasant 77 year old  woman with the history of HOCM.  She also has a history of chronotropic  incompetence and is status post pacemaker placement.  She has been  complaining of worsening exertional dyspnea, so we are bring her in for  a treadmill test to assess for exercise capacity and also for  chronotropic incompetence.  Of note, she has been under a great deal of  stress over the past week, as her mother died a couple of days ago and  was buried last night.   Baseline EKG showed sinus rhythm with ventricular pacing, heart rate of  64.   EXERCISE TREADMILL TEST:  Patient walked for three minutes and 11  seconds on a standard Bruce protocol, a total of 4.6 mets.  She stopped  due to shortness of breath and bilateral leg fatigue and calf pain.  Peak heart rate was 81, which is 54% of predicted.  Her blood pressure  response was blunted.  She was V-paced throughout the entire test.   CONCLUSION:  1. Poor exercise tolerance, likely multifactorial, unable to clearly      evaluate her chronotropic response.  2. Symptoms suggestive of peripheral arterial disease; however, recent      ABIs were totally normal.   PLAN/DISCUSSION:  At this point, I have asked her to resume her previous  walking program at a very low level to see if she can build back up her  functional capacity.  We did increase her sensitivity on her pacemaker  to see if this would give her a little bit more heart rate response.  I  will see her back in a few weeks to see how she is doing.  If she is not  progressing, we may consider cardiopulmonary exercise testing or  potentially even cardiac catheterization.     Shaune Pascal. Bensimhon,  MD  Electronically Signed    DRB/MedQ  DD: 06/03/2007  DT: 06/03/2007  Job #: [id number]

## 2010-09-18 NOTE — Progress Notes (Signed)
[facility] OFFICE [facility]   ELISHEVA, DESPOSITO                      MRN:          [id number]  DATE:08/24/2007                            DOB:          26-Aug-1933    Ms. Knisley returns today for followup.  She is a very pleasant woman,  with a history of hypertrophic cardiomyopathy and syncope and complete  heart block status post pacemaker insertion, who returns today for  problems of chronotropic incompetence.  The patient has had increasing  dyspnea over the last several months.  Because of this, she underwent  catheterization by Dr. Haroldine Laws which demonstrated no significant  obstructive coronary disease.  A right heart catheterization apparently  did not demonstrate pulmonary hypertension.  Her prior pulmonary wedge  pressure was 13, and PA systolic was 34.  The patient subsequently  underwent cardiopulmonary stress testing which demonstrated a peak heart  rate of 88 beats per minute indicative of chronotropic incompetence.  Of  course, the patient is status post AV node ablation and has clear  evidence of sinus node dysfunction, and she has suboptimal rate response  on her combination [employer]tronic pacemaker defibrillator.   MEDICATIONS:  1. Lopressor 50 twice a day.  2. Simvastatin 80 a day.  3. Aspirin 81 a day.  4. Protonix 40 a day.  5. Hydrochlorothiazide 25 a day.  6. Disopyramide 150 twice daily.  7. Verapamil 240 a day.  8. Coumadin as directed.   PHYSICAL EXAMINATION:  GENERAL:  She is a pleasant middle-aged woman in  no acute distress.  VITAL SIGNS:  Blood pressure was 133/86. The pulse was 80 and regular.  Respirations were 18.  Weight was 171 pounds.  NECK:  Revealed no jugular venous distention.  There was no thyromegaly.  Trachea is midline.  LUNGS:  Clear bilaterally to auscultation.  No wheezes, rales or  rhonchi.  CARDIOVASCULAR:  Exam revealed a regular rate and rhythm with  normal S1  and a split S2.  EXTREMITIES:  Demonstrated no peripheral edema bilaterally.   Interrogation of defibrillator demonstrates a Helper.  The P  and R waves were 2 and 7, respectively.  The impedance 512 and 560,  respectively.  The threshold was volt of  0.5 in the A, volt of 0.3 in  the V.  Battery voltage was 3.04 volts.  The patient had two mode  switches.  She was A pacing 91% of the time and V pacing 100% of the  time.  Today her rate response was increased from 8 to 9 and from low-  moderate to low activity before rate response kicked in.  Her maximum  rate response was at 120 beats per minute.   IMPRESSION:  1. Symptomatic dyspnea and shortness of breath.  2. Hypertrophic cardiomyopathy on multiple medications.  3. Paroxysmal atrial fibrillation, well controlled on multiple      medications.  4. Syncope status post defibrillator implantation.   DISCUSSION:  Ms. Birkett is stable today, and we have increased her rate  response on her pacemaker.  Hopefully this will improve her cardiac  output and hemodynamics.  We will plan to  see her back in the office in  several weeks to see how she is doing.     Champ Mungo. Lovena Le, MD  Electronically Signed    GWT/MedQ  DD: 08/24/2007  DT: 08/24/2007  Job #: [id number]   cc:   Lisette Grinder

## 2010-09-18 NOTE — Assessment & Plan Note (Signed)
[facility] OFFICE NOTE   VERDELLE, PIPP                      MRN:          [id number]  DATE:08/12/2007                            DOB:          Feb 18, 1934    HISTORY OF PRESENT ILLNESS:  Ms. Osterlund comes in today after having a  right left heart catheterization at [facility] by  Dr. Pierre Bali.  This was really for some dyspnea on exertion and  chest tightness with exertion.  Please refer to his last note for  details.   Fortunately, her cath showed nonobstructive disease in the left  circumflex and right coronary artery.  LV gram was not done.  Her  filling pressures were normal.  There was no outflow tract gradient.   She is still having the discomfort in her chest when she tries to push  it too hard.  Dr. Haroldine Laws wants her to have a CPX test to further sort  this out.  Will make those arrange today.   She has had no complaints from her cath site such as slight tenderness.   MEDICATIONS:  Her medications are unchanged.  She is back on her  Coumadin.   PHYSICAL EXAMINATION:  VITAL SIGNS:  Her blood pressure is 151/84, pulse  65 and regular.  Weight is 171.  HEART:  Reveals a regular rate and rhythm without rub.  LUNGS:  Clear.  Her right groin is stable.  There is no bruit.  There is  no hematoma.  There is no drainage or oozing.  EXTREMITIES:  Her distal pulses are intact.   ASSESSMENT:  I had a nice chat with Ms. Achey today.  I have asked her  to return for CPX test which were are in the process of arranging.  I  suspect that Dr. Haroldine Laws will want her to do this on her baseline  medications including Lopressor and verapamil to reproduce any symptoms  on her current medical program.  I left a voice mail for him.  He will  alter this is necessary.   Will have her come back to see Dr. Haroldine Laws after her CPX test.     Marijo Conception. Verl Blalock, MD, [facility]  Electronically  Signed    TCW/MedQ  DD: 08/12/2007  DT: 08/12/2007  Job #: [id number]   cc:   Shaune Pascal. Bensimhon, MD  Lisette Grinder

## 2010-09-18 NOTE — Assessment & Plan Note (Signed)
[facility] OFFICE NOTE   JULYANNA, HEICHEL                      MRN:          [id number]  DATE:02/09/2008                            DOB:          10-14-1933    PRIMARY CARE PHYSICIAN:  Dr. Lisette Grinder at [place].   INTERVAL HISTORY:  [name] is a delightful 77 year old woman with a  history of hypertrophic cardiomyopathy complicated by syncope as well as  symptomatic bradycardia.  She is now status post ICD implant.  She also  has a history of nonobstructive coronary artery disease, bilateral  carotid artery stenosis, paroxysmal atrial fibrillation, and renal cell  carcinoma status post resection of the right kidney with residual  chronic renal insufficiency as well as COPD.   Most recent echocardiogram showed an EF of 65-70% with no left LV  outflow tract gradient.  There was some mild-to-moderate mitral  regurgitation.  She was having problems with significant exertional  fatigue and dyspnea.  She underwent a CTX testing which showed severe  chronotropic incompetence with a peak heart rate of 88.  Since that  time, she had her pacemaker adjusted and she has had marked improvement  in her exercise capacity.  She is now walking 3 miles a day with  friends.  She still gets short of breath at times, but this is much much  better.  She also has occasional sharp chest pains, but these are very  transient.  She has not had any firing of her defibrillator.   CURRENT MEDICATIONS:  1. Metoprolol 50 b.i.d.  2. Simvastatin 80 a day.  3. Aspirin 81 a day.  4. Protonix 40 a day.  5. HCTZ 25 a day.  6. Disopyramide 150 b.i.d.  7. Verapamil 240 a day.  8. Coumadin.   PHYSICAL EXAMINATION:  GENERAL:  She is in no acute distress.  Ambulates  in the clinic without respiratory difficulty.  VITAL SIGNS:  Blood pressure was initially 166/90, on manual recheck  160/84, heart rate 77, and weight is 163.  HEENT:   Normal.  NECK:  Supple.  There is no JVD.  Carotids are 2+ bilaterally without  bruits.  There is no lymphadenopathy or thyromegaly.  CARDIAC:  PMI is  nondisplaced.  She has a regular rate and rhythm with a 2/6 systolic  ejection murmur at the left sternal border.  LUNGS:  Clear with mildly  prolonged expiratory phase.  ABDOMEN:  Soft, nontender, and nondistended.  No hepatosplenomegaly.  No  bruits.  No masses.  Good bowel sounds.  EXTREMITIES:  Warm with no cyanosis, clubbing, or edema.  No rash.  NEUROLOGIC:  Alert and oriented x3.  Cranial nerves II through XII are  intact.  Moves all 4 extremities without difficulty.  Affect is  pleasant.   EKG shows AV pacing at a rate of 77.   ASSESSMENT AND PLAN:  1. Hypertrophic cardiomyopathy, most recent echocardiogram showed no      evidence of outflow tract gradient.  She is much improved with      pacing.  Continue current therapy.  2. Carotid  artery stenosis.  She will be due for repeat ultrasound at      the next visit.  3. Paroxysmal atrial fibrillation.  She is maintaining sinus rhythm.      Continue Coumadin.  She will need close followup in the Coumadin      Clinic.  4. Hypertension.  This is suboptimally controlled.  We will switch her      metoprolol over to carvedilol 12.5 b.i.d.  5. Hyperlipidemia. She is followed by Dr. Gilford Rile.  Goal LDL is less      than 70.   DISPOSITION:  We will see her back in several months for routine  followup.     Shaune Pascal. Bensimhon, MD  Electronically Signed    DRB/MedQ  DD: 02/09/2008  DT: 02/10/2008  Job #: [id number]

## 2010-09-18 NOTE — Assessment & Plan Note (Signed)
[facility] OFFICE NOTE   [name], [name]                      MRN:          [id number]  DATE:03/04/2007                            DOB:          [birth year]    PRIMARY CARE PHYSICIAN:  Dr. Lisette Grinder in [place].   INTERVAL HISTORY:  [name] is a delightful 77 year old woman with a  history of hypertrophic cardiomyopathy complicated by syncope.  She is  now status post ICD.  She has a history of nonobstructive coronary  artery disease, carotid artery stenosis, renal cell carcinoma, status  post resection of the right kidney with mild chronic renal insufficiency  and COPD.   She returns today for routine followup.  Her most recent echo on  June 25, 2006 showed vigorous LV function with no significant LVOT  gradient.  She tells me that although she is still walking about 2 miles  several days a week, she often has to stop due to leg pain, particularly  in her right calf, but also in the left.  She also gets somewhat short  of breath and feels her exercise tolerance is not what it used to be.  She has not had any syncope.  No presyncope.  No chest pain.   CURRENT MEDICATIONS:  1. Aspirin 81 mg daily.  2. Protonix 40 daily.  3. HCTZ 25 a day.  4. Metoprolol 25 b.i.d.  5. Potassium 20 daily.  6. Lipitor 80 a day.  7. Disopyramide 150 b.i.d.  8. Verapamil 240 a day.  9. Coumadin.   PHYSICAL EXAMINATION:  She is in no acute distress.  She ambulates  around the clinic without any respiratory difficulty.  Blood pressure is 142/80.  Heart rate is 70.  Weight is 159.  HEENT:  Normal.  NECK:  Supple.  There is no JVD.  Carotids are 1+ bilaterally.  She has  bilateral bruits, right greater than left.  There is no lymphadenopathy  or thyromegaly.  CARDIAC:  PMI is nondisplaced.  She is regular.  There is a very soft  systolic ejection murmur at the right sternal border, which is minimally  worse  with Valsalva but not significantly so.  LUNGS:  Decreased air movement throughout but are clear.  There is a  long expiratory phase.  ABDOMEN:  Soft, nontender, nondistended.  There is no  hepatosplenomegaly.  No bruits.  No masses.  Good bowel sounds.  EXTREMITIES:  Warm with no clubbing, cyanosis or edema.  Distal pulses  are trace on the right and absent on the left.   ASSESSMENT/PLAN:  1. Decreased exercise tolerance and shortness of breath:  She does      have a history of hypertrophic obstructive cardiomyopathy but on      her last echocardiogram, there is no left ventricular outflow tract      gradient appreciated.  We will go ahead and repeat her      echocardiogram.  She did have a cardiac catheterization in 2006      which showed mild nonobstructive coronary artery disease.  Pending  the results of her echocardiogram, we may have to consider repeat      catheterization or cardiopulmonary exercise testing.  2. Claudication:  She almost certainly has significant peripheral      vascular disease.  We will set her up for ABIs and lower extremity      ultrasound.  I encouraged her to keep with her walking program.  3. Carotid stenosis:  She is due for a repeat ultrasound to further      evaluate.  4. Hypertension:  I will increase her Lopressor to 50 b.i.d.  5. Hyperlipidemia:  She has been having trouble affording the Lipitor.      We will switch her to simvastatin 80.  Goal LDL is less than 70,      given her peripheral vascular disease.   DISPOSITION:  We will see her back in 4-6 weeks to follow up on her  testing.     Shaune Pascal. Bensimhon, MD  Electronically Signed    DRB/MedQ  DD: 03/04/2007  DT: 03/04/2007  Job #: [id number]   cc:   Lisette Grinder

## 2010-09-18 NOTE — Assessment & Plan Note (Signed)
[facility] OFFICE NOTE   [name], [name]                      MRN:          [id number]  DATE:05/11/2008                            DOB:          [birth year]    PRIMARY CARE PHYSICIAN:  Lisette Grinder.   INTERVAL HISTORY:  [name] is a delightful 77 year old woman with a  history of hypertrophic cardiomyopathy complicated by syncope, as well  as symptomatic bradycardia.  She is status post ICD.  She also has a  history of nonobstructive coronary artery disease with chronic chest  pain, bilateral carotid artery stenosis, paroxysmal atrial fibrillation,  renal cell carcinoma status post resection of her right kidney with  residual chronic renal insufficiency, and COPD.   She presents today for routine followup.  Overall, she is doing fairly  well.  She has a CPX test sometime back, which showed severe  chronotropic incompetence with the peak heart rate of 88 and we  increased her heart rate response.  She does continue to have some  chronic dyspnea, but this is unchanged.  There is also occasional chest  pain, which was also unchanged.  These are very transient.  She is not  walking as much as she was previously just because of the cold weather.  She does note that she gets occasional palpitations, is a very short-  lived, she has not had any syncope or presyncope, or her defibrillator  has not fired.   CURRENT MEDICATIONS:  1. Simvastatin 80 a day.  2. Aspirin 81.  3. Protonix 40 a day.  4. HCTZ 25 a day.  5. Disopyramide 150 b.i.d.  6. Verapamil 240, which she is not taking.  7. Coumadin.  8. Coreg 12.5 b.i.d.   PHYSICAL EXAMINATION:  GENERAL:  She is well-appearing in no acute  distress.  Ambulates around the clinic without any respiratory  difficulty.  VITAL SIGNS:  Blood pressure is 122/60, heart rate 69, weight is 165.  HEENT:  Normal.  NECK:  Supple.  There is no JVD.  Carotids are 2+  bilaterally.  There is  a bruit on the right.  There is no lymphadenopathy or thyromegaly.  CARDIAC:  PMI is nondisplaced.  Regular rate and rhythm with 2/6  systolic ejection murmur at the left sternal border, I do not hear any  LVOT murmur.  LUNGS:  Clear with mildly prolonged expiratory phase.  ABDOMEN:  Soft, nontender, nondistended.  No hepatosplenomegaly.  No  bruits.  No masses.  EXTREMITIES:  Warm with no cyanosis or clubbing.  There is trace to 1+  edema bilaterally with venous insufficiency.  No rash.  NEUROLOGICAL:  Alert and oriented x3.  Cranial nerves II through XII are  intact.  Moves all 4 extremities without difficulty.  Affect is  pleasant.   EKG shows sinus rhythm, maybe atrial pacing with also V pacing.  Heart  rate 69.   ASSESSMENT AND PLAN:  1. Hypertrophic obstructive cardiomyopathy.  She is doing well.  Last      echocardiogram did not show any significant gradient.  We will  continue current therapy and restart her verapamil.  2. Hypertension is well controlled.  3. Palpitations.  She currently appears to be in sinus rhythm.  We      will restart her verapamil.  I told her if these continue, we      should put her on 2-week event monitor.  4. Nonobstructive coronary artery disease.  Continue risk factor      modification.  5. Hyperlipidemia, is followed by her primary care physician.  Goal      LDL is less than 70.  6. Carotid artery stenosis.  These are in the 60-79% range      bilaterally.  She is due for an ultrasound in the Spring.     Shaune Pascal. Bensimhon, MD  Electronically Signed    DRB/MedQ  DD: 05/11/2008  DT: 05/12/2008  Job #: [id number]

## 2010-09-18 NOTE — Assessment & Plan Note (Signed)
[facility] OFFICE NOTE   ABBOTT, SARTOR                      MRN:          [id number]  DATE:05/22/2007                            DOB:          Aug 18, 1933    PRIMARY CARE PHYSICIAN:  Lisette Grinder, MD, in [place].   INTERVAL HISTORY:  [name] is a delightful 77 year old woman with a  history of hypertrophic cardiomyopathy complicated by syncope.  She is  now status post ICD.  She also has a history of nonobstructive coronary  artery disease, bilateral carotid artery stenosis, renal cell carcinoma  status post resection of right kidney, with mild chronic renal  insufficiency and COPD.   She returns today for routine follow-up.  She says that over the past 6  months she has been more short of breath, attributes this really to the  fact that she has not been as diligent about her walking program and  also has gained about 15 pounds back.  She did have an echocardiogram  that showed an EF of 65-70%.  There was no left ventricular outflow  tract gradient.  She did have mild to moderate mitral regurgitation and  mild tricuspid regurgitation.  Her pulmonary pressures were just mildly  elevated.  She is trying to get back to her walking program but is  somewhat limited by leg pain.  She had ABIs which showed normal blood  flow.  She has not had any firings of her defibrillator.  No significant  chest pain.  No orthopnea or PND.  No significant lower extremity edema.   CURRENT MEDICATIONS:  1. Lopressor 50 mg b.i.d.  2. Simvastatin 80 mg a day.  3. Aspirin 81 mg a day.  4. Protonix 40 mg a day.  5. Hydrochlorothiazide 25 mg a day.  6. Potassium 20 mEq a day.  7. Disopyramide 150 mg b.i.d.  8. Verapamil 240 mg a day.  9. Coumadin.   PHYSICAL EXAM:  She is in no acute distress, ambulates around the clinic  without any respiratory difficulty.  Blood pressure is 150/76, heart rate is 62, weight is 166.  HEENT:  Normal.  NECK:  Supple.  There is no JVD.  Carotids are 1+ bilaterally with  bilateral bruits.  There is no lymphadenopathy or thyromegaly.  CARDIAC:  PMI is nondisplaced.  She is regular.  There is a soft  systolic ejection murmur at the right sternal border as well as a soft  mitral regurgitation murmur at the apex.  LUNGS:  Clear with mildly decreased air movement throughout.  There is a  long expiratory phase.  ABDOMEN:  Soft, nontender, nondistended.  No hepatosplenomegaly.  No  bruits.  No masses.  Good bowel sounds.  EXTREMITIES:  Warm with no cyanosis, clubbing or edema.  She does have  spider veins.  NEUROLOGIC:  Alert and oriented x3.  Cranial nerves II-XII intact.  Moves all four extremities without difficulty.  Affect is pleasant.   EKG shows AV sequential pacing at a rate of 62.   ASSESSMENT AND PLAN:  1. Shortness of breath.  This is somewhat  chronic.  Once again      suggested that she resume her exercise program and weight loss      program and see how she does.  We will see her back in 3 months for      reassessment.  Should she remain short of breath, I think it would      be very reasonable to have her pacemaker re-interrogated to make      sure the settings are okay and she does not have a significant      chronotropic incompetence, as she is 100% paced.  2. Hypertension.  She says that other places her blood pressure has      been well-controlled.  I am not sure if she has a component of      white coat syndrome or not.  She will get a home monitor and keep a      blood pressure log and bring this back to me.  3. Hyperlipidemia.  She is due for a recheck of her lipids.  Goal LDL      is under 100.  4. Spider veins.  I have referred her to Serita Butcher, MD, of      [facility].   DISPOSITION:  We will see her back in 3 months for routine follow-up.     Shaune Pascal. Bensimhon, MD  Electronically Signed    DRB/MedQ  DD: 05/22/2007  DT:  05/22/2007  Job #: [id number]   cc:   Aram Candela, M.D.

## 2010-09-18 NOTE — Assessment & Plan Note (Signed)
[name], [name]                      MRN:          [id number]  DATE:07/30/2007                            DOB:          December 20, 1933    PRIMARY CARE PHYSICIAN:  Dr. Lisette Grinder in [place].   INTERVAL HISTORY:  [name] is a delightful 77 year old woman with a  history of severe hypertrophic cardiomyopathy complicated by syncope.  She is now status post ICD on her initial echocardiogram back in 2006.  She had a resting outflow tract gradient of 127 that increased to 180  mmHg with Valsalva.  She has been treated with the disopyramide, beta  blockers and calcium channel blockers.  On her last several  echocardiograms, there has been no evidence of outflow tract gradient or  SAM. She also has a history of nonobstructive coronary artery disease by  catheterization in 2006. As well as COPD and mild chronic renal  insufficiency, creatinine about 1.3 in the setting of resection of one  of her kidneys for renal cell carcinoma.   She returns today for routine follow-up.  She is back to her exercise  program. She is now walking three miles a day but continues to feel  short of breath and says that during her walk she has to stop about  three times due to severe chest pressure. She feels this is getting  somewhat worse. She also has trouble catching her breath during these  episodes.  She rests and the symptoms go away. She has not had any  orthopnea nor PND just  occasional mild lower extremity edema.   CURRENT MEDICATIONS:  1. Lopressor 50 b.i.d.  2. Simvastatin 80 a day.  3. Aspirin 81 mg a day.  4. Protonix 40 mg day.  5. Hydrochlorothiazide 25 a day.  6. Potassium 20 a day.  7. Disopyramide 150 b.i.d.  8. Verapamil 240 a day.  9. Coumadin.   PHYSICAL EXAM:  She is no acute distress. She ambulates around the  clinic without any respiratory difficulty.  Blood pressure is  initially  140/86, on manual recheck 158/80, heart rate 80, weight 167.  HEENT:  Normal.  NECK:  Supple.  There is no JVD.  Carotids are 1+ bilaterally with  bilateral bruits.  There is no lymphadenopathy or thyromegaly.  CARDIAC:  PMI is nondisplaced.  She is irregular.  There is a soft  systolic ejection murmur at the right sternal border.  This does not  increase with Valsalva.  LUNGS:  Clear with mildly decreased air movement  throughout.  ABDOMEN:  Soft, nontender, nondistended, no hepatosplenomegaly.  No  bruits, no masses.  Good bowel sounds.  EXTREMITIES:  Warm, no cyanosis, clubbing or edema.  Distal pulses are  faint to absent.   Interrogation of her ICD shows AV pacing about 90% of the time.  On  March 11, she did have several runs of atrial fibrillation.  However,  she has been in sinus rhythm since that time.   ASSESSMENT/PLAN:  1. Chest pain and dyspnea. She did have moderate nonobstructive  coronary artery disease by catheterization about 3 years ago. Her      symptoms are concerning to me for possible angina.  Obviously this      could be from outflow tract obstruction but we have not seen any of      this on her echocardiograms since initiation of therapy.  Some of      it may be provoked with exertion.  I think the best thing to do at      this time is to proceed with relook catheterization right and left      catheterization which we will plan for next week.  We will stop her      Coumadin.  2. Chronotropic incompetence. On interrogating her device, it does not      look like her heart rates are getting up much past 60 or 70.      Cystogram is not complete with her device.  We have changed her      rate activity response level to make it more sensitive to see if      this will help her. We considered decreasing her verapamil but we      will not do until after her catheterization.  Should her      catheterization be normal, I think if is reasonable to proceed  with      CPX testing to evaluate her dyspnea as well as her chronotropic      response to exercise.  3. Paroxysmal atrial fibrillation.  Continue Coumadin.  4. Hypertension.  Blood pressure is mildly elevated. Consider possible      ACE inhibitor after catheterization.  5. Carotid artery stenosis.  She has 60-79% stenoses bilaterally.  She      will be due for follow-up in May 8.   DISPOSITION:  Pending the results of her catheterization.     [name] Pascal. Bensimhon, MD  Electronically Signed    DRB/MedQ  DD: 07/30/2007  DT: 07/30/2007  Job #: [id number]   cc:   Lisette Grinder

## 2010-09-18 NOTE — Progress Notes (Signed)
[facility] OFFICE NOTE   BRETA, LOBNER                      MRN:          [id number]  DATE:09/19/2008                            DOB:          28-Jul-1933    [name] returns today for followup.  She is a very pleasant woman  with a history of hypertrophic cardiomyopathy, symptomatic bradycardia,  and she is status post ICD insertion.  She returns today for followup.  She denies chest pain or shortness of breath.  She had no intercurrent  IC therapies.  She has rare palpitations.  Her main complaint today is  pain and weakness in her legs, particularly when she cuts the grass.  The patient has a self-help more then unfortunately, the self-help  mechanism is not working requiring that she pushes and for which is  quite heavy.  After several trips up and down, she gets weak and tired  and has pain in her legs that stops with rest.   MEDICATIONS:  Medicines today include,  1. Simvastatin 80 a day.  2. Aspirin 81 a day.  3. Protonix 40 a day.  4. HCTZ 25 a day.  5. Disopyramide 150 mg twice daily.  6. Coumadin as directed.  7. Carvedilol 12.5 twice daily.   PHYSICAL EXAMINATION:  GENERAL:  She is a pleasant, well-appearing  woman, in no distress.  VITAL SIGNS:  Blood pressure was 150/80, the pulse was 70 and regular,  respirations were 18.  NECK:  No jugular venous distention.  LUNGS:  Clear bilaterally to auscultation.  No wheezes, rales, or  rhonchi are present, and no increased work of breathing.  CARDIOVASCULAR:  Regular rate and rhythm.  Normal S1 and S2.  ABDOMEN:  Soft, nontender.  EXTREMITIES:  Demonstrate no edema.   Interrogation of her defibrillator demonstrates an ICD and that  demonstrates a Med[employer]onic maximal ICD.  The patient has a 69, 49 lead in  which appears to be working normally.  The impedances today were 504 in  the atrium and 416 in the ventricle.  P-waves were 1.1.   The R-waves  were 5.1.  The impedance was 42 through the device and the atrial  impedance was 504.  The ventricular pacing impedance was 416.  Battery  voltage was 2.95 volts.   IMPRESSION:  1. Hypertrophic cardiomyopathy.  2. Syncope.  3. Status post dual-chamber implantable cardioverter-defibrillator      insertion.   DISCUSSION:  Overall, [name] is stable.  She has had no recurrent  syncopal episodes.  She has had no intercurrent IC therapies.  She does  go in and out of atrial fibrillation at times and this has been  documented on the cardiac monitor.  These have typically last less than  a minute or two.  We will plan on seeing the patient back in the office  for ICD and Afib, and followup in 1 year.     Champ Mungo. Lovena Le, MD  Electronically Signed    GWT/MedQ  DD: 09/19/2008  DT: 09/20/2008  Job #: [id number]   cc:   Lisette Grinder

## 2010-09-21 NOTE — H&P (Signed)
[name], WIGHTMAN NO.:  [id number]   MEDICAL RECORD NO.:  [id number]          PATIENT TYPE:  INP   LOCATION:  3304                         FACILITY:  [facility]   PHYSICIAN:  Glori Bickers, M.D. LHCDATE OF BIRTH:  [birth year]   DATE OF ADMISSION:  02/22/2005  DATE OF DISCHARGE:                                HISTORY & PHYSICAL   PRIMARY CARE PHYSICIAN:  Dr. Lisette Grinder in the [facility] in  [place].   CARDIOLOGIST:  She is new to [facility].   CHIEF COMPLAINT:  Syncope and chest pain.   [name] is a delightful 77 year old woman with a history of previous  tobacco use, renal cell carcinoma, status post right kidney resection, and  palpitations who was admitted with multiple episodes of syncope and chest  pain.  She reports a long history of brief palpitations which sound a bit  like PACs and PVCs which were previously evaluated at [facility].  She was  prescribed Atenolol.  She has no previous history of known coronary artery  disease.  She has never had a cardiac catheterization.  Over the past week,  she tells me she has had multiple episodes of abrupt syncope while walking  or standing up.  These have occurred without warning.  She has also had  episodes of chest pain.  She reports that her functional status has been  much decreased over the last month due to a progressive dyspnea on exertion,  leg fatigue, and possible claudication.  Earlier this week, she saw her  primary care doctor who ordered an echocardiogram.  This reportedly showed  IHSS (idiopathic hypertrophic subaortic stenosis).  She was thus referred to  Dr. Derinda Sis as an outpatient at [facility] for further evaluation in the [facility].  Today, she had squeezing chest pain off and on all afternoon and  she was getting ready to go to the emergency room at [facility].  At that point,  she had an abrupt syncopal episode without any palpitations or other  warning.  She came to the ER for  further evaluation.  She still reports some  mild chest tightness. She is under a great deal of stress due to taking care  of her husband who is essentially bedbound from end-stage COPD.   REVIEW OF SYSTEMS:  As per HPI and problems list.  She also reports some  arthritic pain and also she states that when she walks she sometimes notices  that she veers to the left but has not had any other focal neurologic  symptoms.  She has not had any bleeding.  There has been no bright red blood  per rectum.  No melena.  No fevers of chills.  All other systems negative  except as above.   PAST MEDICAL HISTORY:  1.  Hypertension.  2.  Palpitations.  3.  History of tobacco use with presumed COPD.      1.  One to one and a half packs per day times greater than 40 years,          quit in 2003.  4.  History of  right renal cell carcinoma, status post resection in 2003.  5.  Chronic renal insufficiency secondary to above.  6.  Hyperlipidemia.  7.  Status post hysterectomy.   CURRENT MEDICATIONS:  1.  Lipitor 10 mg a day.  2.  Hydrochlorothiazide 25 mg a day.  3.  Atenolol 25 mg a day.   DRUG ALLERGIES:  1.  SULFA.  2.  CODEINE.  She denies any allergy to shellfish or IV contrast dye.   SOCIAL HISTORY:  She lives in [place].  She lives with her husband who  is essentially bed bound due to severe COPD.  She has two grown kids.  She  is retired.  She has a history of tobacco use one to one and a half packs a  day x 40 years, quit in 2003.  She denies any alcohol history.   FAMILY HISTORY:  Her mother is alive at age 84.  She does have a history of  syncope and has a pacer in place.  Her father died at 38 due to a motor  vehicle accident.  She has a brother who died in [place] and a sister who is  93 years old and is otherwise healthy.   PHYSICAL EXAMINATION:  GENERAL:  She is in no acute distress.  She is able  to lie flat in bed.  Respirations are unlabored.  VITAL SIGNS:  Blood pressure  is 188/60 with heart rate of 51.  She is  afebrile.  She is sating in the high 90s on room air.  HEENT:  Sclerae are anicteric.  EOMI.  There is no xanthelasma.  Mucous  membranes are moist.  NECK:  Supple.  Jugular venous pressure is about 7-cmH2O.  Carotids are 2+  on the left and 1+ on the right.  There are bilateral bruits.  There is no  lymphadenopathy or thyromegaly.  CARDIAC:  She is bradycardic and regular.  There is a 2/6 late peaking  systolic ejection murmur at the right sternal border which radiates over the  clavicles and carotids.  This does increase with Valsalva.  There is rub or  gallop.  S2 is okay.  LUNGS:  Have decreased air movement throughout but otherwise clear.  ABDOMEN:  Soft, mildly obese, nontender, nondistended.  There is no  hepatosplenomegaly.  No bruits.  No masses.  EXTREMITIES:  Warm with no cyanosis, clubbing, or edema.  Femoral pulses are  1+ on the right and 2+ on the left.  There are bilateral bruits.  DP pulses  are 1+ on the right and not palpable on the left.  NEURO:  She is alert and oriented x3.  She moves all four extremities  without difficulty.  Cranial nerves II-XII are intact.  There is no pronator  drift.  Babinski is down bilaterally.  Her affect is bright.   LABORATORY DATA:  White count is 5.1, hemoglobin is 12, platelets are 228.  Sodium is 140, potassium 4.2, chloride is 105, bicarb is 29, glucose is 103,  BUN is 26, creatinine is 1.7.  INR is 1.0.  Point-of-care markers MB is 3.9  with a troponin of less than 0.5.  EKG shows sinus brady at a rate of 53  with no significant ST-T wave changes.   ASSESSMENT:  1.  Recurrent syncope with questionable idiopathic hypertrophic subaortic      stenosis/HOCM on outside echocardiogram.  2.  Chest pain, possible unstable angina.  3.  History of palpitations, question supraventricular tachycardia versus     premature  atrial contractions or premature ventricular contractions with      current  sinus bradycardia.  4.  Peripheral vascular disease.  5.  Probable chronic obstructive pulmonary disease.  6.  Chronic renal insufficiency, status post right kidney resection      secondary to renal cell carcinoma.   DISCUSSION:  I had a long talk with Ms. Bielen and her family members  regarding exact etiology of her syncope.  While this remains unclear, she  does have evidence of LV outflow tract obstruction on exam, concerning for  HOCM versus aortic stenosis, but she may also have a component of tachy  arrhythmia or a brady arrhythmias contributing.   PLAN:  For now, the plan will be:  1.  Admit to stepdown.  2.  Rule out MI and observe on telemetry.  3.  Check 2D echocardiogram.  4.  Right and left heart cath on Monday with a bicarb drip prior, given her      renal insufficiency.  5.  Hold beta-blocker for now secondary to bradycardia.  6.  Treat her hypertension but would be careful with nitroglycerin given her      LV outflow tract obstruction.  7.  She will need an ultrasound of her carotids and lower extremities.   Should she have symptomatic HOCM, she will likely need referral to Dr.  Derinda Sis at [facility] for further evaluation and treatment.      Glori Bickers, M.D. [facility]  Electronically Signed     DB/MEDQ  D:  02/22/2005  T:  02/23/2005  Job:  [id number]   cc:   Lisette Grinder  Fax: [phone]

## 2010-09-21 NOTE — Assessment & Plan Note (Signed)
[facility] OFFICE NOTE   TARONDA, KOKES                      MRN:          [id number]  DATE:12/17/2005                            DOB:          18-Dec-1933    PRIMARY CARE PHYSICIAN:  Lisette Grinder, M.D., [place], [place].   IDENTIFYING INFORMATION/JUSTIFICATION FOR ADMISSION AND CARE:  Ms. Hursh  is a very pleasant 77 year old woman who returns for routine followup.   PROBLEM LIST:  1. Hypertrophic obstructive cardiomyopathy, complicated by multiple      episodes of syncope.      a.     Echocardiogram October 2006 with a normal left ventricular       function with a resting outflow gradient of 75 mmHg, increased to 125       mmHg with Valsalva.      b.     Status post ICD.  2. Non-obstructive coronary artery disease by cardiac catheterization      October 2006.  3. Symptomatic bradycardia, status post pacemaker/ICD.  4. Carotid artery stenosis.      a.     A carotid ultrasound in July 2007, 40% to 59% on the right, 60%       to 79% on the left, followup in 1 year. Previously evaluated by Dr.       Oneida Alar.  5. History of renal cell carcinoma, status post resection of the right      kidney in [id number], complicated by mild chronic renal insufficiency.  6. Hypertension.  7. Hyperlipidemia.  8. Probable chronic obstructive pulmonary disease with history of tobacco      use. Quit in 2006.   CURRENT MEDICATIONS:  1. Aspirin 81 daily.  2. Protonix 40 daily.  3. Verapamil 180 daily.  4. Hydrochlorothiazide 25 daily.  5. Metoprolol 25 b.i.d.  6. Potassium 20 meq daily.  7. Lipitor 80 daily.  8. Disopyramide 150 mg b.i.d.   ALLERGIES:  NO KNOWN DRUG ALLERGIES.   HISTORY OF PRESENT ILLNESS:  Ms. Browe returns today for routine followup.  Overall, she is doing quite well. She tells me that her husband died on 27-Sep-2005 and she remains sad about that but has decided to make the best of  things and  is trying to walk several days a week. She denies any significant  shortness of breath with exertion. She does have very mild chest pain when  she pushes herself too hard but not with moderate exertion. She has not had  any syncope but does have occasional episodes where she feels like she has a  vague sense of loosing herself. She has not had any problems with her  medications.   PHYSICAL EXAMINATION:  GENERAL:  Well appearing. No acute distress.  Respirations are unlabored. She ambulates around the clinica without  difficulty.  VITAL SIGNS:  Blood pressure is initially 132/70 on the left, however,  manual recheck 160/80. Heart rate 62. Weight is 163.  HEENT:  Sclerae anicteric. EO MI. No xanthelasma. Moist mucous membranes.  NECK:  Supple. No obvious jugular venous distention. Carotids are  2+  bilaterally with a soft bruit on the left.  CARDIAC:  Regular rate and rhythm with a very faint outflow tract murmur,  which seems much decreased from previous.  LUNGS:  Clear with mildly prolonged expiratory phase.  ABDOMEN:  Soft, nontender, and nondistended. No hepatosplenomegaly. No  bruits. No masses. Good bowel sounds.  EXTREMITIES:  Warm with minimal edema. No clubbing, cyanosis.  NEUROLOGIC:  Alert and oriented times three. Cranial nerves 2-12 are intact.  Moves all 4 extremities without difficulty.   LABORATORY DATA:  EKG shows primarily V-paced rhythm with occasional A-  pacing. Ventricle response is 63 beats per minute.   ASSESSMENT/PLAN:  1. Hypertrophic obstructive cardiomyopathy. She is doing quite well.      Exercise tolerance appears stable, if not a bit improved. Given her      hypertension, will increase her verapamil to 240 mg daily. She will      need a yearly echocardiogram. She will also followup with Dr. Derinda Sis in [facility].  2. Question palpitations. Her ICD was interrogated today and showed      multiple episodes of high atrial rates, up to 400.  This is consistent      with atrial fibrillation. We did discuss the possibility of      Cardioversion, however, she seems to be tolerating the rhythm quite      well. Her ventricular response is well controlled. We will start her on      Coumadin and follow her closely.  3. Hypertension. Increase verapamil as above.  4. Hyperlipidemia.  This is followed by Dr. Gilford Rile. However, we will check      her lipids just to see where she is at today.   DISPOSITION:  Return to clinic in a few months for routine followup.                                Shaune Pascal. Bensimhon, MD    DRB/MedQ  DD:  12/17/2005  DT:  12/18/2005  Job #:  [id number]   cc:   Sande Brothers, M.D.

## 2010-09-21 NOTE — Op Note (Signed)
[name], [name] NO.:  [id number]   MEDICAL RECORD NO.:  [id number]          PATIENT TYPE:  INP   LOCATION:  2009                         FACILITY:  [facility]   PHYSICIAN:  Champ Mungo. Lovena Le, M.D.  DATE OF BIRTH:  [birth year]   DATE OF PROCEDURE:  02/28/2005  DATE OF DISCHARGE:                                 OPERATIVE REPORT   PROCEDURE PERFORMED:  Implantation of a dual-chamber ICD.   INDICATION:  Hypertrophic cardiomyopathy with syncope. Hypotension with an  exercise testing, and sinus bradycardia.   I. INTRODUCTION:  The patient is a very pleasant 77 year old one who was  admitted to hospital with syncope and chest pain. The patient was  subsequently found to be bradycardic, at times, and was found to have a very  large gradient across her LV outflow tract consistent with hypertrophic  cardiomyopathy. Her septum was enlarged at 16 mm. The patient has had a  history of recurrent syncopal episodes as well as near syncopal episodes.  She underwent exercise testing which demonstrated a drop in her blood  pressure with exertion. With these multiple high-risk criteria for sudden  cardiac death including syncope, hypertrophic cardiomyopathy, and  hypotension with a exercise testing; the patient is now referred for ICD  implantation.   II. PROCEDURE:  After informed consent was obtained, the patient was taken  to the diagnostic EP lab in the fasting state. After the usual preparation  and draping, intravenous fentanyl and metaxalone were given for sedation. 30  mL of lidocaine was infiltrated into the left infraclavicular region. A 8-cm  incision was carried out over this region; electrocautery utilized to  dissect down the fascial plane. 10 mL of contrast demonstrated a patent left  subclavian vein; and it was subsequently punctured x2 and the Medtronic  model 5076, 45-cm active fixation pacing lead, serial number [id number] was  advanced into the right atrium; and  the Medtronic model 6949, 58-cm, active  fixation pacing lead, serial number [id number] was advanced into the right  ventricle. Mapping was subsequently carried out in the right ventricle and  the final site at the RV apex. The R waves measured approximately 9-10 mV  and the pacing impedance was 1100 ohms with the lead actively fixed; the  pacing threshold 0.5 volts at 0.5 milliseconds. A 10 volt pacing did not  stimulate the diaphragm.   With the ventricular lead in satisfactory position, attention was then  turned to placement of the atrial lead. It was placed in the right atrial  appendage where P waves measured 3 mV and the pacing impedance with lead  actively fixed was 626 ohms. The pacing threshold was 1.2 volts of 0.5  milliseconds. With both atrial and ventricular leads in satisfactory  position, they were secured to the subpectoralis fascia with a figure-of-  eight silk suture. The sewing sleeve was also secured with a silk suture.  Electrocautery was then utilized to make subcutaneous pocket. Kanamycin  irrigation was utilized to irrigate the pocket; and electrocautery utilized  to assure hemostasis. The Medtronic maximal DR model 7278, dual-chamber ICD,  serial number [id number] was connected  to the atrial and ventricular pacing  leads and placed in the subcutaneous pocket. Generator was secured with silk  suture. At this point defibrillation threshold testing was carried out.   After the patient was more deeply sedated with fentanyl and Versed, VF was  induced with T wave shock, or 50 Hz pacing. Initially a 15 joules shock was  delivered which terminated in ventricular fibrillation and restored sinus  rhythm; five minutes was allowed to elapse, and a second DFT test carried  out. Again, VF was induced with a T wave shock. At this time a 10 joules  shock was delivered which terminated with ventricular fibrillation and  restored sinus rhythm. At this point no additional  defibrillation threshold  testing was carried out; and the incision closed with a layer of 2-0 Vicryl,  followed by layer 3-0 Vicryl, followed by a layer of 4-0 Vicryl. Benzoin was  painted on the skin, Steri-Strips were applied, and a pressure dressing was  placed; and the patient was returned to her room in satisfactory condition.   III. COMPLICATIONS:  There were no immediate procedure complications.   IV. RESULTS:  This demonstrates successful implantation of a Medtronic dual-  chamber ICD in a patient with hypertrophic cardiomyopathy, syncope, and  hypotension with exercise testing in the setting of sinus bradycardia.           ______________________________  Champ Mungo. Lovena Le, M.D.     GWT/MEDQ  D:  02/28/2005  T:  02/28/2005  Job:  [id number]   cc:   [name], M.D. [facility] [address]  [place]  [place] [address]   [name]  Fax: [phone]

## 2010-09-21 NOTE — Assessment & Plan Note (Signed)
[facility] OFFICE NOTE   ROZELLE, CAROSELLA                      MRN:          [id number]  DATE:06/10/2006                            DOB:          1933-06-25    PRIMARY CARE PHYSICIAN:  Dr. Lisette Grinder in [place].   INTERVAL HISTORY:  Ms. Calvey is a delightful 77 year old woman with a  history of hypertrophic obstructive cardiomyopathy, complicated by  syncope.  She is now status post ICD.  She also has a history of non-  obstructive coronary artery disease, carotid artery stenosis, renal cell  carcinoma, status post resection of the right kidney and mild chronic  renal insufficiency, as well as chronic obstructive pulmonary disease.   She returns today for routine followup.  Since we last saw her she has  been very aggressive about watching her diet and exercising, since her  husband died.  She has now lost 30 pounds and it looks fantastic.  She  feels happy and is walking two miles a day without any significant chest  pain or shortness of breath.  She does have very occasional twinges of  chest pain, but these are unrelated to exertion.  She has not had any  pre-syncope or syncope.  No palpitations.  No ICD firings.   PAST MEDICAL HISTORY:  For a complete history, please see my note from  December 17, 2005.   CURRENT MEDICATIONS:  1. Aspirin 81 mg.  2. Protonix 40 mg.  3. Hydrochlorothiazide 25 mg.  4. Metoprolol 25 mg b.i.d.  5. Potassium 20.  6. Lipitor 80 mg.  7. Disopyramide 150 mg b.i.d.  8. Verapamil 240 mg q.d.  9. Coumadin.   PHYSICAL EXAMINATION:  GENERAL:  She looks wonderful.  She ambulates  around the clinic without any respiratory difficulty.  VITAL SIGNS:  Blood pressure 110/66, heart rate 70, weight 148 pounds.  HEENT:  Sclerae anicteric.  EOMI.  No xanthelasma.  Mucous membranes  moist.  Oropharynx clear.  NECK:  Supple, no jugular venous distention.  Carotids are 2+  bilaterally.  There are bilateral bruits.  No lymphadenopathy or  thyromegaly.  HEART:  A regular rate and rhythm with a systolic ejection murmur across  the LV outflow track which increases with Valsalva.  It is 2/6.  LUNGS:  Clear with a prolonged expiratory phase.  ABDOMEN:  Soft, nontender, non-distended, no hepatosplenomegaly, no  bruits.  No masses appreciated.  Good bowel sounds.  EXTREMITIES:  Warm with no cyanosis or clubbing.  There is trace edema.  Also some varicosities.  No rashes.  NEUROLOGIC:  She is alert and oriented x3.  Cranial nerves II-XII  intact.  She moves all four extremities without difficulty.  Affect is  delightful.   Electrocardiogram shows atrioventricular sequential pacing at a rate of  63.   ASSESSMENT/PLAN:  1. Hypertrophic obstructive cardiomyopathy:  She is doing wonderfully      with her recent diet and weight loss program.  She is functional      class 1 to 2.  We will continue her current medications.  We can  even consider stopping her hydrochlorothiazide, as her blood      pressure drifts down with her weight loss.  We will recheck an      echocardiogram to further evaluate.  2. Carotid artery stenosis:  She is due for her follow-up ultrasound.      She has previously seen Dr. Jessy Oto. Fields from [facility].  3. Hypertension, well-controlled.  4. Hyperlipidemia:  Given her carotid artery stenosis, she needs to be      treated aggressively and get her LDL under 70.  This is followed by      Dr. Lisette Grinder.  5. Non-obstructive coronary artery disease:  Continue on medical      therapy.   DISPOSITION:  Will see her back in six months for routine followup.  Once again I reinforced how proud I was with her life style changes.     Shaune Pascal. Bensimhon, MD  Electronically Signed    DRB/MedQ  DD: 06/10/2006  DT: 06/10/2006  Job #: [id number]   cc:   Jenny Reichmann M.D. [facility]

## 2010-09-21 NOTE — Cardiovascular Report (Signed)
[name], [name] NO.:  [id number]   MEDICAL RECORD NO.:  [id number]          PATIENT TYPE:  INP   LOCATION:  3304                         FACILITY:  [facility]   PHYSICIAN:  Glori Bickers, M.D. LHCDATE OF BIRTH:  [birth year]   DATE OF PROCEDURE:  02/25/2005  DATE OF DISCHARGE:                              CARDIAC CATHETERIZATION   PRIMARY CARE PHYSICIAN:  Dr. Lisette Grinder in the [facility] in  [place].   CARDIOLOGIST:  Dr. Haroldine Laws.   PROCEDURES PERFORMED:  1.  Selective coronary angiography.  2.  Left heart cath.  3.  Right heart cath with Fick cardiac output.  4.  Angio-Seal femoral closure.   DESCRIPTION OF PROCEDURE:  The risks and benefits of catheterization were  explained to [name] and her family; consent was signed and placed on  the chart. A 6-French arterial sheath was placed in right femoral artery  using a modified Seldinger technique. Standard catheters including JL-4, JR-  4, and angled pigtail were used for the procedure. All catheter exchanges  were made over wire. A 7-French venous sheath was placed in right femoral  vein. At the end of the procedure, the patient's femoral artery was closed  using Angio-Seal. There were no apparent complications.   CONTRAST:  40 cc.   HEMODYNAMIC RESULTS:  Right atrial mean of 3, RV 40/0, PA 37/11 with a mean  of 25. Pulmonary capillary wedge was a mean of 11. Central aortic pressure  was 182/57 with a mean of 100. LV pressure was 174/1 with LVEDP of 16. There  was no evidence of aortic stenosis as pigtail crossed easily over the aortic  valve and there was no gradient on pullback.   PA saturations were 64% and SVC saturation was 66%. Femoral artery  saturation was 92%. Fick cardiac output was 4.5 liters per minute. Fick  cardiac index was 2.4 liters per minute per meter square. Pulmonary vascular  assistance was 3 Woods units.   Left main was normal.   LAD was a long vessel wrapping  the apex. It gave off a single small diagonal  in the mid section. In the proximal portion of the LAD, there was a 40% cyst  tubular lesion. In the midportion after the takeoff of diagonal, there was a  30% lesion associated with a small intramyocardial bridge.   Left circumflex was moderate-sized vessel that gave off a small ramus and  moderate-sized branching OM1. The AV groove circumflex was tiny. There was a  40% lesion in the mid left circumflex and some luminal irregularities in the  OM1.   RCA was a moderate-sized dominant vessel. It gave a moderate-sized PDA and a  small PL. There was a 30% proximal lesion in the RCA and a 40% midlesion.   Left ventriculogram was not done due to her chronic renal insufficiency and  presence of only one kidney. I have reviewed her echocardiogram before the  procedure. The ejection fraction was normal. There was some mild septal  prominence without significant LV outflow gradient. The aortic valve  appeared thickened but did not appear significantly stenotic.  ASSESSMENT:  1.  Nonobstructive coronary disease.  2.  Mild pulmonary hypertension.  3.  Normal left ventricular function by echocardiogram with septal      prominence but no evidence of significant left ventricular outflow tract      gradient at rest.  4.  Sinus brady.  5.  Multiple episodes of syncope.   PLAN/DISCUSSION:  The etiology of her syncope and chest pain remained  unclear to me. Given her multiple episodes of unprovoked syncope, I do think  she will need EP to evaluate her for possible EP study. She is not to drive  until this was sorted out.      Glori Bickers, M.D. [facility]  Electronically Signed     DB/MEDQ  D:  02/25/2005  T:  02/26/2005  Job:  [id number]   cc:   Lisette Grinder, M.D.  [facility]

## 2010-09-21 NOTE — Consult Note (Signed)
[name], [name] NO.:  [id number]   MEDICAL RECORD NO.:  [id number]          PATIENT TYPE:  INP   LOCATION:  2009                         FACILITY:  [facility]   PHYSICIAN:  Jessy Oto. Fields, MD  DATE OF BIRTH:  01-Jul-1933   DATE OF CONSULTATION:  02/28/2005  DATE OF DISCHARGE:                                   CONSULTATION   CARDIOVASCULAR THORACIC SURGERY CONSULTATION   REFERRING PHYSICIAN:  Glori Bickers, M.D. [facility]   DATE OF CONSULTATION:  February 28, 2005.   REASON FOR CONSULTATION:  Possible symptomatic carotid stenosis.   HISTORY OF PRESENT ILLNESS:  The patient is a 77 year old female admitted  for work up of palpitations and syncope.  As part of her work up she had a  carotid ultrasound for bilateral carotid bruits.  This showed a left  internal carotid artery stenosis of 60 to 80% and a right internal carotid  artery stenosis of 40 to 60%.  She has had episodes of right hand clumsiness  for approximately six to eight months.  These occur three to four times per  week.  She states that occasionally these occur in the left hand but are  much less frequent in the left than they are on the right.  She denies prior  stroke or transient ischemic attack.  She states the episodes occur without  warning and seem like some days are worse and some days are better.  She  does not describe any frank weakness.  She has no slurring of her speech.  She is right handed.  Atherosclerotic risk factors include coronary artery  disease, increased cholesterol, hypertension.   PAST MEDICAL HISTORY:  As above.   PHYSICAL EXAMINATION:  VITAL SIGNS:  Blood pressure 130/50, heart rate 50.  GENERAL APPEARANCE:  She is a white female in no acute distress.  HEENT:  Negative.  NECK:  Bilateral soft carotid bruits with 2+ carotid pulses.  CHEST:  Clear to auscultation.  CARDIAC:  Examination shows regular rate and rhythm with 3/6 systolic  murmur.  ABDOMEN:  Soft,  nontender.  EXTREMITIES:  2+ radial pulses bilaterally.  She has 1+ dorsalis pedis  pulses bilaterally.  NEUROLOGICAL:  She is alert and oriented X3.  Her motor examination is 5/5  in the upper and lower extremities for strength.  Her left arm is slightly  weaker currently due to a pacemaker today.  Sensation is intact.   ASSESSMENT AND PLAN:  Left internal carotid artery stenosis 60 to 80%.  I  doubt that her clumsy episodes with her hand are directly related to her  internal carotid artery stenosis.  I would categorize this as a asymptomatic  moderate carotid stenosis.  She needs follow up carotid duplex in six  months' time.  She should continue her aspirin.  Findings and plan were  discussed with the patient and her daughter.           ______________________________  Jessy Oto. Fields, MD     CEF/MEDQ  D:  02/28/2005  T:  02/28/2005  Job:  [id number]

## 2010-09-21 NOTE — Assessment & Plan Note (Signed)
[facility] OFFICE NOTE   CANELA, HACH                      MRN:          [id number]  DATE:08/26/2006                            DOB:          1934/01/29    Ms. Skutt returns today for followup.  She is a very pleasant woman  with syncope and hypertrophic cardiomyopathy status post ICD insertion  with dual chamber pacing.  She returns today for followup.  She has had  no recurrent syncope since her defibrillator was placed.  She does have  a history of remote renal cell carcinoma status post resection.  He does  have chronic renal insufficiency.  The patient is doing well today.  She  does note some dyspnea with exertion but otherwise has been quite  stable.  She denies any intercurrent ICD therapies, she denies  palpitations.   EXAM:  She is a pleasant, well-appearing, middle-aged woman in no  distress.  Blood pressure was 148/74, the pulse 68 and regular,  respirations were 18, weight was 158 pounds.  NECK:  No jugular venous distention.  LUNGS:  Clear bilaterally to auscultation.  No wheezes, rales or  rhonchi.  CARDIOVASCULAR EXAM:  A regular rate and rhythm with a normal S1 and S2.  There is a grade 2/6 systolic murmur throughout upper sternal border.  EXTREMITIES:  Demonstrate no cyanosis, clubbing or edema.   Interrogation of her defibrillator demonstrates a Medtronic Maximo with  P and R waves of 2 and 9 respectively.  The impedance 552 in the atrium,  768 in the ventricle, a threshold with volt of 0.5 in the atrium and  volt of 0.2 in the right ventricle.  Battery voltage was 3.1 volts.  She  was 95% A-paced, 100% V-paced.  Today her outputs were reprogrammed to  minimize and unnecessary ICD shocks.   IMPRESSION:  1. Hypertrophic cardiomyopathy with syncope.  2. Status post dual chamber ICD insertion.   DISCUSSION:  Overall, Ms. Petrelli is stable.  I have asked that she  continue  being active and continue on her present medical therapy with  Verapamil, Coumadin and disopyramide, Lipitor, potassium, metoprolol and  HCTZ.  We will see her back in the office for ICD followup in 1 year.     Champ Mungo. Lovena Le, MD  Electronically Signed    GWT/MedQ  DD: 08/26/2006  DT: 08/26/2006  Job #: [id number]   cc:   Lisette Grinder

## 2010-09-21 NOTE — Discharge Summary (Signed)
[name], [name] NO.:  [id number]   MEDICAL RECORD NO.:  [id number]          PATIENT TYPE:  INP   LOCATION:  2009                         FACILITY:  [facility]   PHYSICIAN:  Signa Kell, M.D.DATE OF BIRTH:  04-08-1934   DATE OF ADMISSION:  02/22/2005  DATE OF DISCHARGE:                                 DISCHARGE SUMMARY   ALLERGIES:  CODEINE and SULFA.   DISCHARGE DIAGNOSES:  1.  Admitted with multiple episodes of syncope/no warning and accompanied by      chest pain.  The syncope is with progressing frequency and severity.  2.  Troponin I studies that admission negative x3.  They were 0.02 and then      0.03, then 0.02.  3.  Negative orthostatic study.  Lying blood pressure is 142/47 with a heart      rate of 54.  Standing blood pressure after 5 minutes 149/40 with a heart      rate of 56.  4.  Resting bradycardia.  5.  Left ventricular outflow tract cardiomyopathy confirmed by      echocardiogram.  6.  Nonobstructive coronary artery disease by left and right heart      catheterization on October 23.  The study showed no aortic stenosis and      mild pulmonary hypertension.  7.  Bilateral carotid bruits.  Carotid duplex study was done on February 26, 2005 showing extracranial cerebrovascular occlusive disease.  8.  Pulmonary nodules on chest x-ray followed up with computed tomogram of      the chest.  9.  Liver lesions imaged partially on computed tomogram of the chest.  The      patient is status post magnetic resonance imaging of the abdomen.  This      is especially important with the patient with a history of renal cell      carcinoma.  10. Cardiopulmonary stress study showing mild to moderate decrease in      functional capacity with both pulmonary and circulatory limitation to      exercise.  THERE IS DECREASED BLOOD PRESSURE RESPONSE WITH EXERCISE.      (The patient with syncope/decreased blood pressure response to exercise      in setting  of HOCM referred for cardioverted defibrillator to decrease      the risk of sudden cardiac death.  11. Status post implantation of MedTronic DDD/cardioverted defibrillator.      This is a [place], February 28, 2005.  12. Right apical pneumothorax (with pleuritic chest pain).  Post procedure      day 1 status post implantation of cardioverted defibrillator.  The      patient will have a follow up chest x-ray on October 28.  She has been      placed on oxygen therapy for a 24 hour period prior to follow up chest x-      ray.  13. Decreased peripheral pulses awaiting ankle-brachial indexes.   SECONDARY DIAGNOSES:  1.  History of renal cell carcinoma status post right nephrectomy with  chronic renal insufficiency.  2.  Peripheral vascular occlusive disease.  3.  History of ongoing tobacco habituation.  Probable chronic obstructive      pulmonary disease.  4.  Hypertension.   PROCEDURES:  1.  February 25, 2005, cardiac catheterization.  The study showed that the      left main was normal, the left anterior descending gives a small single      diagonal in the midsection.  The proximal left anterior descending had a      40% tubular lesion.  The mid-left anterior descending after the diagonal      had a 30% lesion.  The left circumflex is moderate size with a small      ramus, moderate sized obtuse marginal 1, the A-V groove was tiny.  A 40%      lesion in the mid-left circumflex with luminal irregularities in the      first obtuse marginal.  Right coronary artery was moderate size and      dominant.  There was a moderate posterior descending artery, a small PL      30% proximal right coronary artery lesion with a 40% mid-lesion.  Left      ventriculogram not done due to chronic renal insufficiency in the      presence of 1 kidney.  2.  Computed tomogram of the chest.  This study showed bilateral calcific      granulomas which correspond to the nodules found on the chest  x-ray.      There was no adenopathy.  3.  Magnetic resonance imaging of the abdomen.  The study showed normal      gallbladder, pancreas and stomach.  The spleen had 1 lesion consistent      with hemangioma.  The liver had 2 lesion.  Both of these are consistent      with hemangiomas.  No retroperitoneal adenopathy.  4.  February 26, 2005, carotid duplex ultrasound.  The study showed that the      right had 40% to 60% internal carotid artery stenosis probably at the      upper range of the scale.  Vertebral artery flow is antegrade.  The left      had a 60% to 80% internal carotid artery stenosis probably at the upper      range of the scale.  5.  February 28, 2005, implantation of [place].  6.  Echocardiogram, February 25, 2005.  Ejection fraction 65% to 75%.  There      is significant proximal septal thickening with peak left ventricular      outflow tract at rest had a gradient of 119 mm Hg.  The peak left      ventricular outflow tract gradient with Valsalva 178 mm Hg.  7.  Cardiopulmonary function study.  This was done on October 25.  This      study showed that there was mild to moderate decrease in functional      capacity with both pulmonary and circulatory limitation to exercise.      There was decreased blood pressure response.  Along with an      echocardiogram, this study was pivotal for impression to proceed with      cardioverted defibrillator implantation.   DISCHARGE DISPOSITION:  We are anticipating discharge March 02, 2005,  hospital day 9.  She had cardioverted defibrillator implanted on October 26.  Post procedural interrogation shows normal ICD function with no changes  made.  __________ body is within normal limits.  The patient did have  defibrillator threshold study less than or equal to 10 joules.  This was  performed at the time of implantation.  The patient demonstrates acceptable  A/V pacing and sensing.  The incision looks good at the time of  discharge. There is no erythema, drainage or swelling.  Chest x-ray taken on  postprocedure day #1 showed 10% right apical pneumothorax.  This is on the  contralateral side to pacemaker implantation and is somewhat unexpected.  The patient has been placed on oxygen support for a 24 hour period and will  have a repeat chest x-ray on the morning of October 28.  If there is no  increase in size of the apical pneumothorax, the patient will be able to go  home.   DISCHARGE MEDICATIONS:  She will be discharged on the following medications.  1.  Enteric-coated aspirin 81 mg daily.  2.  Lipitor 10 mg daily at bedtime.  3.  Hydrochlorothiazide 25 mg daily.  4.  Hydralazine 10 mg every 8 hours, one tablet in the morning, one tablet      at 3 p.m. and one tablet at 10 p.m.  5.  Protonix 40 mg daily.  6.  Toprol-XL 25 mg daily.  7.  Norpace 150 mg twice daily.  8.  Tylenol 325 mg one tablet every 4 to 6 hours as needed for pain.   DISCHARGE DIET:  Low sodium, low cholesterol diet.   The patient is asked to keep her incision dry for the next 7 days and sponge  bathe until Thursday, November 2.  Driving:  She is asked not to drive until  she sees Dr. Haroldine Laws in the office on Monday, November 20 at 10:30.  She  has follow up __________ [facility].  1.  She will present to the [facility] Monday, November 6 at 9:15.  2.  She will see Dr. Haroldine Laws Monday, November 20 at 10:30 in the morning.  3.  She will see Dr. Lovena Le June 25, 2005 at 11:40 in the morning.  4.  Duplex ultrasound of the carotid arteries in 6 months.  Our office will      call with that appointment.  Dr. Tanna Furry telephone number is [phone].  5.  She will also see Dr. Derinda Sis at [facility] as      scheduled.   BRIEF HISTORY:  Mrs. Cristofaro is a 77 year old female.  She has a history of  ongoing tobacco habituation.  She is status post resection of the right  kidney for renal cell carcinoma.  She is  admitted with multiple episodes of  syncope accompanied by chest pain.  She relates a long history of brief  palpitations.  She was previously evaluated at [facility] and started on Atenolol.  She has no known history of previous  coronary artery disease and she has never had a catheterization study.  Over  the past week, she has had multiple episodes of abrupt episodes of syncope  while walking.  They come on with no warning.  She also has episodes of  chest pain.  She also complains of decreased exercise tolerance due to  dyspnea on exertion, leg fatigue, claudication.  Earlier this week, she went  to her primary care physician.  An echocardiogram showed the possibility of  idiopathic hypertrophic subaortic stenosis.  She was referred to Dr. Derinda Sis at [facility].  Today, she had  squeezing chest pain off and on all afternoon  as she was getting ready for her appointment with Dr. Mina Marble at [facility].  Then,  she had abrupt syncope without palpitation, dizziness.  She presents to the emergency room.   HOSPITAL COURSE:  The patient presents to [facility] Emergency Room on  October 20 with complaint of syncope in a setting of chest pain.  Cardiac  enzymes were changed and they were negative x3.  A 2-D echocardiogram, as  dictated above, showed evidence of proximal septal thickening, ejection  fraction 65% to 75% and there was left ventricular outflow tract gradient of  significant values.  The patient was also noted to have a resting  bradycardia.  She was maintained on IV heparin until a left heart  catheterization could be arranged.  This was done on October 23.  The study  showed nonobstructive coronary artery disease with mild pulmonary  hypertension.  In addition, because of syncope and bradycardia without  pauses, an electrophysiology study was obtained.  There was some concern  also as well as how to treat for possible symptomatic HOCM.  The patient  underwent repeat  echocardiogram which showed significant proximal septal  thickening and left ventricular outflow gradients both at rest and with  Valsalva.  In addition, because of carotid bruits, carotid duplex studies  were obtained which showed extracranial cerebrovascular occlusive disease.  There was some evidence that she has right-handed clumsiness dropping things  without warning and the patient has undergone a cardiovascular surgical  consult.  At present, they recommend follow up duplex ultrasound in 6 months  and continued aspirin.  The patient next underwent a cardiopulmonary  exercise study which showed a decreased blood pressure response with  exercise.  This is a paradoxic result, of course, and represents one more  factor contributing to the need for primary prevention of sudden cardiac  death in the setting of HOCM.  The patient underwent implantation of  cardioverted defibrillator on October 26.  She had no dysrhythmic  complications and her incision is healing nicely, but she did have a small  10% right apical pneumothorax which will be addressed on the probable  discharge date of October 27.   LABORATORY STUDIES:  And now for laboratory studies.  This admission her  troponin I study was 0.02, then 0.03, then 0.02.  Hemoglobin A1c was 6.1.  The BNP on admission was 242.8.  The fasting lipid profile this admission:  Cholesterol 214, triglycerides 161, HDL cholesterol 38, LDL cholesterol 144.  The thyroid stimulating hormone was 2.224.  Serum electrolytes on admission  showed sodium 140, potassium 4.2, chloride 105, carbonate 29, glucose 102,  BUN is 16, creatinine 1.7.  This represents her baseline creatinine.  Alkaline phosphatase 50, SGOT 17, SGPT is 14.  Serum electrolytes on October  27 after left and right heart catheterization:  Sodium 138, potassium 4.2,  chloride 104, carbonate 25, glucose 95, BUN is 23 and creatinine is 1.7.  Complete blood count on admission on October 20:   White cells 5.1, hemoglobin 12, hematocrit 35.4, platelets 228.  The official report of the  two days echocardiogram on October 23:  The left ventricle is hyperdynamic  with near cavity obliteration, accelerated outflow through the LVOT.  The  LVOT is narrow (11 mm).  There is SAM of the anterior mitral leaflet.  Peak  gradient through the LVOT is 127.  It increased to 180 with Valsalva.  This  is consistent with hypertrophic cardiomyopathy with  ejection of obstruction  to outflow.  Overall, left ventricular systolic function was  hyperdynamic with left ventricular ejection fraction estimated at 75% to  85%.  There was Doppler evidence of dynamic left ventricular outflow tract  obstruction during Valsalva.   This discharge summary took in excess of 30 minutes to produce.      Sueanne Margarita, P.A.    ______________________________  E. Raymond Gurney, M.D.    GM/MEDQ  D:  03/01/2005  T:  03/02/2005  Job:  [id number]   cc:   Champ Mungo. Lovena Le, M.D.  [address]  [address]  [place]  [place] [address]   Glori Bickers, M.D. [address]  [place]  [place] [address]   Derinda Sis, Dr.  [facility]   Lisette Grinder, Dr.  [facility]

## 2010-09-25 ENCOUNTER — Ambulatory Visit (INDEPENDENT_AMBULATORY_CARE_PROVIDER_SITE_OTHER): Payer: Medicare Other | Admitting: Family Medicine

## 2010-09-25 DIAGNOSIS — Z7901 Long term (current) use of anticoagulants: Secondary | ICD-10-CM

## 2010-09-25 DIAGNOSIS — I4891 Unspecified atrial fibrillation: Secondary | ICD-10-CM

## 2010-09-25 DIAGNOSIS — Z5181 Encounter for therapeutic drug level monitoring: Secondary | ICD-10-CM

## 2010-09-25 LAB — POCT INR: INR: 2.4

## 2010-09-25 NOTE — Patient Instructions (Signed)
Continue current dose and return in 4 weeks.  

## 2010-09-26 ENCOUNTER — Other Ambulatory Visit: Payer: Self-pay | Admitting: *Deleted

## 2010-09-26 MED ORDER — ALPRAZOLAM 0.25 MG PO TABS: 0.2500 mg | ORAL_TABLET | Freq: Every evening | ORAL | Status: DC | PRN

## 2010-09-27 ENCOUNTER — Other Ambulatory Visit: Payer: Self-pay

## 2010-09-27 MED ORDER — CARVEDILOL 12.5 MG PO TABS: 12.5000 mg | ORAL_TABLET | Freq: Two times a day (BID) | ORAL | Status: DC

## 2010-09-27 NOTE — Telephone Encounter (Signed)
Medication phoned to pharmacy.  

## 2010-10-11 ENCOUNTER — Encounter: Payer: Self-pay | Admitting: Internal Medicine

## 2010-10-17 ENCOUNTER — Encounter: Payer: Self-pay | Admitting: Internal Medicine

## 2010-10-23 ENCOUNTER — Ambulatory Visit: Payer: PRIVATE HEALTH INSURANCE | Admitting: Internal Medicine

## 2010-10-23 ENCOUNTER — Ambulatory Visit: Payer: Medicare Other

## 2010-10-29 ENCOUNTER — Ambulatory Visit: Payer: Medicare Other

## 2010-10-31 ENCOUNTER — Ambulatory Visit: Payer: Self-pay | Admitting: Family Medicine

## 2010-10-31 NOTE — Patient Instructions (Signed)
Patient is noncomplient with following through with her INR appts. I spoke with her today and schedule another appt for 7.2.2012

## 2010-11-16 ENCOUNTER — Encounter: Payer: Self-pay | Admitting: Internal Medicine

## 2010-11-19 ENCOUNTER — Telehealth: Payer: Self-pay | Admitting: Radiology

## 2010-11-19 NOTE — Telephone Encounter (Signed)
Patient is non compliant with INR appts. LMOM x 3 with no call back.

## 2010-11-19 NOTE — Telephone Encounter (Signed)
Noted.  Is she going to cards instead?

## 2010-11-19 NOTE — Telephone Encounter (Signed)
Spoke with patient and we manage her coumadin.  She was scheduled on [facility] on Wednesday 11/21/2010.

## 2010-11-21 ENCOUNTER — Other Ambulatory Visit: Payer: Self-pay | Admitting: *Deleted

## 2010-11-21 ENCOUNTER — Encounter: Payer: Self-pay | Admitting: Internal Medicine

## 2010-11-21 ENCOUNTER — Other Ambulatory Visit: Payer: Medicare Other

## 2010-11-21 MED ORDER — WARFARIN SODIUM 5 MG PO TABS: 5.0000 mg | ORAL_TABLET | ORAL | Status: DC

## 2010-11-28 ENCOUNTER — Other Ambulatory Visit: Payer: Medicare Other

## 2010-11-28 ENCOUNTER — Ambulatory Visit (INDEPENDENT_AMBULATORY_CARE_PROVIDER_SITE_OTHER): Payer: Medicare Other | Admitting: Family Medicine

## 2010-11-28 ENCOUNTER — Telehealth: Payer: Self-pay

## 2010-11-28 DIAGNOSIS — Z7901 Long term (current) use of anticoagulants: Secondary | ICD-10-CM

## 2010-11-28 DIAGNOSIS — Z5181 Encounter for therapeutic drug level monitoring: Secondary | ICD-10-CM

## 2010-11-28 DIAGNOSIS — I4891 Unspecified atrial fibrillation: Secondary | ICD-10-CM

## 2010-11-28 LAB — POCT INR: INR: 2.4

## 2010-11-28 MED ORDER — CARVEDILOL 12.5 MG PO TABS: 12.5000 mg | ORAL_TABLET | Freq: Two times a day (BID) | ORAL | Status: DC

## 2010-11-28 NOTE — Telephone Encounter (Signed)
Needs a refill for carvedilol 12.5 mg take one tablet twice a day.

## 2010-11-28 NOTE — Patient Instructions (Signed)
Continue current dose, check in 4 weeks  

## 2010-12-26 ENCOUNTER — Ambulatory Visit: Payer: Medicare Other

## 2011-01-01 ENCOUNTER — Ambulatory Visit (INDEPENDENT_AMBULATORY_CARE_PROVIDER_SITE_OTHER): Payer: Medicare Other | Admitting: Family Medicine

## 2011-01-01 DIAGNOSIS — Z5181 Encounter for therapeutic drug level monitoring: Secondary | ICD-10-CM

## 2011-01-01 DIAGNOSIS — I4891 Unspecified atrial fibrillation: Secondary | ICD-10-CM

## 2011-01-01 DIAGNOSIS — Z7901 Long term (current) use of anticoagulants: Secondary | ICD-10-CM

## 2011-01-01 LAB — POCT INR: INR: 2.5

## 2011-01-01 NOTE — Patient Instructions (Signed)
Continue 2.5 mg daily, 5 mg Tu,Th, recheck in 4 weeks

## 2011-01-10 ENCOUNTER — Other Ambulatory Visit: Payer: Self-pay | Admitting: *Deleted

## 2011-01-10 MED ORDER — ATORVASTATIN CALCIUM 40 MG PO TABS: 40.0000 mg | ORAL_TABLET | Freq: Every day | ORAL | Status: DC

## 2011-01-15 ENCOUNTER — Encounter: Payer: Self-pay | Admitting: Internal Medicine

## 2011-01-16 ENCOUNTER — Telehealth: Payer: Self-pay

## 2011-01-16 MED ORDER — DISOPYRAMIDE PHOSPHATE 150 MG PO CAPS: 150.0000 mg | ORAL_CAPSULE | Freq: Two times a day (BID) | ORAL | Status: DC

## 2011-01-16 NOTE — Telephone Encounter (Signed)
Refill requested for disopyramide phospha 150 mg bid

## 2011-01-18 ENCOUNTER — Encounter: Payer: Self-pay | Admitting: Internal Medicine

## 2011-01-29 ENCOUNTER — Ambulatory Visit (INDEPENDENT_AMBULATORY_CARE_PROVIDER_SITE_OTHER): Payer: Medicare Other | Admitting: Family Medicine

## 2011-01-29 DIAGNOSIS — I4891 Unspecified atrial fibrillation: Secondary | ICD-10-CM

## 2011-01-29 DIAGNOSIS — Z5181 Encounter for therapeutic drug level monitoring: Secondary | ICD-10-CM

## 2011-01-29 DIAGNOSIS — Z7901 Long term (current) use of anticoagulants: Secondary | ICD-10-CM

## 2011-01-29 LAB — POCT INR: INR: 3.1

## 2011-01-29 NOTE — Patient Instructions (Signed)
Continue 2.5 mg daily, 5 mg Tu,Th, recheck in 4 weeks, increase greens

## 2011-02-13 ENCOUNTER — Encounter: Payer: Self-pay | Admitting: Cardiovascular Disease

## 2011-02-13 ENCOUNTER — Ambulatory Visit (INDEPENDENT_AMBULATORY_CARE_PROVIDER_SITE_OTHER): Payer: Medicare Other | Admitting: Cardiovascular Disease

## 2011-02-13 DIAGNOSIS — I429 Cardiomyopathy, unspecified: Secondary | ICD-10-CM

## 2011-02-13 DIAGNOSIS — I421 Obstructive hypertrophic cardiomyopathy: Secondary | ICD-10-CM

## 2011-02-13 DIAGNOSIS — I4891 Unspecified atrial fibrillation: Secondary | ICD-10-CM

## 2011-02-13 DIAGNOSIS — I6529 Occlusion and stenosis of unspecified carotid artery: Secondary | ICD-10-CM

## 2011-02-13 DIAGNOSIS — R079 Chest pain, unspecified: Secondary | ICD-10-CM

## 2011-02-13 DIAGNOSIS — E785 Hyperlipidemia, unspecified: Secondary | ICD-10-CM

## 2011-02-13 NOTE — Assessment & Plan Note (Signed)
History of HOCM. No recent syncope or lightheadedness. We have suggested if she has dizziness, that she increase her fluid and salt intake.

## 2011-02-13 NOTE — Assessment & Plan Note (Addendum)
We ordered a repeat lipid panel when she has her carotid ultrasound. Cholesterol from 2 years ago was elevated. Goal LDL less than 70.

## 2011-02-13 NOTE — Assessment & Plan Note (Signed)
It has been 2 years since her last carotid ultrasound. We will schedule her for a repeat ultrasound.

## 2011-02-13 NOTE — Patient Instructions (Addendum)
Continue current medications.  Your physician has requested that you have a carotid duplex. This test is an ultrasound of the carotid arteries in your neck. It looks at blood flow through these arteries that supply the brain with blood. Allow one hour for this exam. There are no restrictions or special instructions.  Your physician recommends that you return for a FASTING lipid profile: Same day as carotid u/s (lipid/lft)  Your physician recommends that you schedule a follow-up appointment in: 6 months

## 2011-02-13 NOTE — Assessment & Plan Note (Signed)
She does have chronic chest pain. It is very atypical in nature. We've asked her to contact us if it gets worse. If needed, we could perform a stress test or repeat catheterization.

## 2011-02-13 NOTE — Progress Notes (Signed)
Patient ID: [name], female    DOB: May 18, 1933, 77 y.o.   MRN: [id number]  HPI Comments: [name] is a delightful 77 year old woman with a history of hypertrophic cardiomyopathy complicated by syncope, COPD, as well as symptomatic bradycardia.  She is status post ICD.  She also has a history of nonobstructive coronary artery disease with chronic chest pain, bilateral carotid artery stenosis, paroxysmal atrial fibrillation, renal cell carcinoma status post resection of her right kidney with residual chronic renal insufficiency, and COPD.     She reports that she had a hip fracture on the right earlier in the year. She ate significant food that time and had weight gain. Since then, she has been trying to walk and exercise on a regular basis. She did reinjure her right hip recently while working in the garden. Overall, she reports that she feels well she does continue to have chest pain. She describes the pain as on the left side of the sternum, sharp, typically lasting for less than one minute. It is associated with rest, not typically with exertion.   stress test in March 2011.  Echo EF 55-60% no LVOT gradient.  No CP or SOB.  Fatigued at times.     Last carotid u/s  6/10; 40-59% R 60-79%. No neuro symptoms.     Cardiac cath 2006    --non-obstructive CAD 30-40s lesions   --ETT 1/09 nondiagnostic due to poor HR response  EKG shows AV paced rhythm     Outpatient Encounter Prescriptions as of 02/13/2011  Medication Sig Dispense Refill  . aspirin 81 MG EC tablet Take 81 mg by mouth daily.        Marland Kitchen atorvastatin (LIPITOR) 40 MG tablet Take 1 tablet (40 mg total) by mouth daily.  30 tablet  3  . carvedilol (COREG) 12.5 MG tablet Take 1 tablet (12.5 mg total) by mouth 2 (two) times daily with a meal.  60 tablet  3  . disopyramide (NORPACE) 150 MG capsule Take 1 capsule (150 mg total) by mouth 2 (two) times daily.  60 capsule  6  . furosemide (LASIX) 20 MG tablet Take 20 mg by mouth  daily.       . hydrochlorothiazide 25 MG tablet Take 1 tablet (25 mg total) by mouth daily.  30 tablet  6  . potassium chloride SA (KLOR-CON M20) 20 MEQ tablet Take 20 mEq by mouth daily.        . verapamil (CALAN-SR) 180 MG CR tablet Take 180 mg by mouth at bedtime.        Marland Kitchen warfarin (COUMADIN) 5 MG tablet Take 1 tablet (5 mg total) by mouth as directed.  30 tablet  11     Review of Systems  Constitutional: Negative.   HENT: Negative.   Eyes: Negative.   Respiratory: Negative.   Cardiovascular: Positive for chest pain.  Gastrointestinal: Negative.   Musculoskeletal: Positive for arthralgias.  Skin: Negative.   Neurological: Negative.   Hematological: Negative.   Psychiatric/Behavioral: Negative.   All other systems reviewed and are negative.    BP 140/82  Pulse 68  Ht 5\' 7"  (1.702 m)  Wt 179 lb (81.194 kg)  BMI 28.04 kg/m2  Physical Exam  Nursing note and vitals reviewed. Constitutional: She is oriented to person, place, and time. She appears well-developed and well-nourished.  HENT:  Head: Normocephalic.  Nose: Nose normal.  Mouth/Throat: Oropharynx is clear and moist.  Eyes: Conjunctivae are normal. Pupils are equal, round, and reactive  to light.  Neck: Normal range of motion. Neck supple. No JVD present. Carotid bruit is present.  Cardiovascular: Normal rate, regular rhythm, S1 normal, S2 normal and intact distal pulses.  Exam reveals no gallop and no friction rub.   Murmur heard.  Crescendo systolic murmur is present with a grade of 2/6  Pulmonary/Chest: Effort normal and breath sounds normal. No respiratory distress. She has no wheezes. She has no rales. She exhibits no tenderness.  Abdominal: Soft. Bowel sounds are normal. She exhibits no distension. There is no tenderness.  Musculoskeletal: Normal range of motion. She exhibits no edema and no tenderness.  Lymphadenopathy:    She has no cervical adenopathy.  Neurological: She is alert and oriented to person,  place, and time. Coordination normal.  Skin: Skin is warm and dry. No rash noted. No erythema.  Psychiatric: She has a normal mood and affect. Her behavior is normal. Judgment and thought content normal.         Assessment and Plan

## 2011-02-26 ENCOUNTER — Other Ambulatory Visit: Payer: Self-pay | Admitting: Orthopedic Surgery

## 2011-02-26 ENCOUNTER — Ambulatory Visit
Admission: RE | Admit: 2011-02-26 | Discharge: 2011-02-26 | Disposition: A | Discharge status: Home / Self Care | Payer: Medicare Other | Source: Ambulatory Visit | Attending: Orthopedic Surgery | Admitting: Orthopedic Surgery

## 2011-02-26 ENCOUNTER — Ambulatory Visit: Payer: Medicare Other

## 2011-02-26 ENCOUNTER — Other Ambulatory Visit: Payer: Medicare Other

## 2011-02-26 DIAGNOSIS — M25551 Pain in right hip: Secondary | ICD-10-CM

## 2011-03-04 ENCOUNTER — Encounter: Payer: Self-pay | Admitting: Family Medicine

## 2011-03-04 ENCOUNTER — Ambulatory Visit (INDEPENDENT_AMBULATORY_CARE_PROVIDER_SITE_OTHER): Payer: Medicare Other | Admitting: Family Medicine

## 2011-03-04 VITALS — BP 142/80 | HR 72 | Temp 98.2°F | Wt 174.0 lb

## 2011-03-04 DIAGNOSIS — R05 Cough: Secondary | ICD-10-CM

## 2011-03-04 DIAGNOSIS — Z7901 Long term (current) use of anticoagulants: Secondary | ICD-10-CM

## 2011-03-04 DIAGNOSIS — I4891 Unspecified atrial fibrillation: Secondary | ICD-10-CM

## 2011-03-04 DIAGNOSIS — Z5181 Encounter for therapeutic drug level monitoring: Secondary | ICD-10-CM

## 2011-03-04 DIAGNOSIS — R059 Cough, unspecified: Secondary | ICD-10-CM

## 2011-03-04 LAB — POCT INR: INR: 3.7

## 2011-03-04 MED ORDER — AZITHROMYCIN 250 MG PO TABS: ORAL_TABLET | ORAL | Status: DC

## 2011-03-04 NOTE — Patient Instructions (Addendum)
Patient saw Dr Deborra Medina today, started antibiotics, recheck in 1 week  Take 2.5 mg daily (reduced by 5 mg week)

## 2011-03-04 NOTE — Progress Notes (Signed)
SUBJECTIVE:  [name] is a 77 y.o. female who complains of coryza, congestion, sore throat, myalgias and bilateral sinus pain for 12 days. She denies a history of anorexia, chest pain, shortness of breath and vomiting and denies a history of asthma.   Patient Active Problem List  Diagnoses  . CARCINOMA, RENAL CELL  . HYPERLIPIDEMIA  . OTHER AND UNSPECIFIED COAGULATION DEFECTS  . ADJUSTMENT DISORDER WITH ANXIETY  . HOCM / IHSS  . CARDIOMYOPATHY, SECONDARY  . ATRIAL FIBRILLATION  . SINUS BRADYCARDIA  . CAROTID ARTERY STENOSIS  . HYPOTENSION  . OTHER ACUTE SINUSITIS  . COPD  . RECTAL BLEEDING  . BLOOD IN STOOL  . RENAL INSUFFICIENCY, CHRONIC  . FATIGUE  . EDEMA  . CHEST PAIN-UNSPECIFIED  . DYSURIA  . INCONTINENCE, URGE  . ICD - IN SITU  . Cough   Past Medical History  Diagnosis Date  . Carotid artery stenosis 09/2007    60-79% bilateral (stable)  . Carotid artery stenosis 10/2008    40-59% R 60-79%   . Chest pain, unspecified   . Automatic implantable cardiac defibrillator in situ   . Atrial fibrillation   . Sinus bradycardia   . Hypotension, unspecified     cardiac cath 2006..nonobstructive CAD 30-40s lesions.Marland KitchenETT 1/09 nondiagnostic due to poor HR response..Right Renal Cancer 2003  . History of hysterectomy 1975    FOR BENIGN CAUSES  . Adjustment disorder with anxiety   . Blood in stool   . Secondary cardiomyopathy, unspecified   . Chronic airway obstruction, not elsewhere classified   . Encounter for long-term (current) use of anticoagulants   . Dysuria   . Edema   . Encounter for therapeutic drug monitoring   . Other malaise and fatigue   . Hypertrophic obstructive cardiomyopathy   . Other and unspecified hyperlipidemia   . Urge incontinence   . Other acute sinusitis   . Other and unspecified coagulation defects   . Other screening mammogram   . Pre-operative cardiovascular examination   . Hemorrhage of rectum and anus   . Chronic kidney disease,  unspecified   . Special screening for osteoporosis   . Renal cancer 2033    Right  . Malignant neoplasm of kidney, except pelvis    Past Surgical History  Procedure Date  . Insert / replace / remove pacemaker 02/28/05    ICD-MEDTRONIC MAXIMAL DR  . Cataract extraction   . Nephrectomy     RIGHT S/P RENAL CELL CANCER  . Abdominal hysterectomy 1975    for benign causes  . Cardiac catheterization 2006   History  Substance Use Topics  . Smoking status: Former Smoker -- 0.5 packs/day for 40 years    Types: Cigarettes    Quit date: 05/06/2001  . Smokeless tobacco: Not on file  . Alcohol Use: No   Family History  Problem Relation Age of Onset  . Colon cancer Neg Hx    Allergies  Allergen Reactions  . Codeine     REACTION: N \\T \ V  . Sulfonamide Derivatives     REACTION: Dry mouth   Current Outpatient Prescriptions on File Prior to Visit  Medication Sig Dispense Refill  . aspirin 81 MG EC tablet Take 81 mg by mouth daily.        Marland Kitchen atorvastatin (LIPITOR) 40 MG tablet Take 1 tablet (40 mg total) by mouth daily.  30 tablet  3  . carvedilol (COREG) 12.5 MG tablet Take 1 tablet (12.5 mg total) by mouth 2 (  two) times daily with a meal.  60 tablet  3  . disopyramide (NORPACE) 150 MG capsule Take 1 capsule (150 mg total) by mouth 2 (two) times daily.  60 capsule  6  . furosemide (LASIX) 20 MG tablet Take 20 mg by mouth daily.       . hydrochlorothiazide 25 MG tablet Take 1 tablet (25 mg total) by mouth daily.  30 tablet  6  . potassium chloride SA (KLOR-CON M20) 20 MEQ tablet Take 20 mEq by mouth daily.        . verapamil (CALAN-SR) 180 MG CR tablet Take 180 mg by mouth at bedtime.        Marland Kitchen warfarin (COUMADIN) 5 MG tablet Take 1 tablet (5 mg total) by mouth as directed.  30 tablet  11      OBJECTIVE: The PMH, PSH, Social History, Family History, Medications, and allergies have been reviewed in [facility], and have been updated if relevant.  She appears well, vital signs are as noted.  Ears normal.  Throat and pharynx normal.  Neck supple. No adenopathy in the neck. Nose is congested. Sinuses non tender. The chest is clear, without wheezes or rales.  ASSESSMENT:   upper respiratory illness  PLAN: Symptomatic therapy suggested: push fluids, rest and return office visit prn if symptoms persist or worsen. Given rx for zpack to cover atypicals given duration of symptoms.  Call or return to clinic prn if these symptoms worsen or fail to improve as anticipated.

## 2011-03-04 NOTE — Patient Instructions (Addendum)
Good to see you. Take antibiotic as directed.  Drink lots of fluids.  Treat sympotmatically with Mucinex, nasal saline irrigation, and Tylenol/Ibuprofen. Also try claritin D or zyrtec D over the counter- two times a day as needed ( have to sign for them at pharmacy). You can use warm compresses.  Cough suppressant at night. Call if not improving as expected in 5-7 days.    Antibiotics can affect how  thin your blood is- please come back next week to recheck PT/INR.

## 2011-03-11 ENCOUNTER — Ambulatory Visit: Payer: Medicare Other

## 2011-03-11 ENCOUNTER — Ambulatory Visit (INDEPENDENT_AMBULATORY_CARE_PROVIDER_SITE_OTHER): Payer: Medicare Other | Admitting: Internal Medicine

## 2011-03-11 ENCOUNTER — Ambulatory Visit (INDEPENDENT_AMBULATORY_CARE_PROVIDER_SITE_OTHER): Payer: Medicare Other | Admitting: Family Medicine

## 2011-03-11 ENCOUNTER — Encounter: Payer: Self-pay | Admitting: Internal Medicine

## 2011-03-11 VITALS — BP 161/78 | HR 73 | Temp 98.2°F | Ht 67.0 in | Wt 174.0 lb

## 2011-03-11 DIAGNOSIS — Z5181 Encounter for therapeutic drug level monitoring: Secondary | ICD-10-CM

## 2011-03-11 DIAGNOSIS — R82998 Other abnormal findings in urine: Secondary | ICD-10-CM

## 2011-03-11 DIAGNOSIS — R8290 Unspecified abnormal findings in urine: Secondary | ICD-10-CM

## 2011-03-11 DIAGNOSIS — R300 Dysuria: Secondary | ICD-10-CM

## 2011-03-11 DIAGNOSIS — Z7901 Long term (current) use of anticoagulants: Secondary | ICD-10-CM

## 2011-03-11 DIAGNOSIS — I4891 Unspecified atrial fibrillation: Secondary | ICD-10-CM

## 2011-03-11 LAB — POCT URINALYSIS DIPSTICK
Bilirubin, UA: NEGATIVE
Glucose, UA: NEGATIVE
Ketones, UA: NEGATIVE
Nitrite, UA: NEGATIVE
Protein, UA: NEGATIVE
Urobilinogen, UA: 0.2
pH, UA: 7

## 2011-03-11 LAB — POCT INR: INR: 5.6

## 2011-03-11 MED ORDER — CEFUROXIME AXETIL 250 MG PO TABS: 250.0000 mg | ORAL_TABLET | Freq: Two times a day (BID) | ORAL | Status: AC

## 2011-03-11 NOTE — Patient Instructions (Signed)
Hold for 3 days then recheck Thurs

## 2011-03-11 NOTE — Progress Notes (Signed)
Subjective:    Patient ID: [name], female    DOB: 22-Feb-1934, 77 y.o.   MRN: [id number]  HPI Did take z-pak from last week---finished about 3 days ago  Noted 3 days ago a bad feeling when passing urine Smells bad--strong Pressure as she tries to void Increased frequency till today--nocturia as much as 4 per night No hematuria No fever  Some lower abdominal discomfort Concerned due to having only 1 kidney No vaginal discharge or rash  Current Outpatient Prescriptions on File Prior to Visit  Medication Sig Dispense Refill  . aspirin 81 MG EC tablet Take 81 mg by mouth daily.        Marland Kitchen atorvastatin (LIPITOR) 40 MG tablet Take 1 tablet (40 mg total) by mouth daily.  30 tablet  3  . carvedilol (COREG) 12.5 MG tablet Take 1 tablet (12.5 mg total) by mouth 2 (two) times daily with a meal.  60 tablet  3  . disopyramide (NORPACE) 150 MG capsule Take 1 capsule (150 mg total) by mouth 2 (two) times daily.  60 capsule  6  . furosemide (LASIX) 20 MG tablet Take 20 mg by mouth daily.       . hydrochlorothiazide 25 MG tablet Take 1 tablet (25 mg total) by mouth daily.  30 tablet  6  . potassium chloride SA (KLOR-CON M20) 20 MEQ tablet Take 20 mEq by mouth daily.        . verapamil (CALAN-SR) 180 MG CR tablet Take 180 mg by mouth at bedtime.        Marland Kitchen warfarin (COUMADIN) 5 MG tablet Take 1 tablet (5 mg total) by mouth as directed.  30 tablet  11    Allergies  Allergen Reactions  . Codeine     REACTION: N \\T \ V  . Sulfonamide Derivatives     REACTION: Dry mouth    Past Medical History  Diagnosis Date  . Carotid artery stenosis 09/2007    60-79% bilateral (stable)  . Carotid artery stenosis 10/2008    40-59% R 60-79%   . Chest pain, unspecified   . Automatic implantable cardiac defibrillator in situ   . Atrial fibrillation   . Sinus bradycardia   . Hypotension, unspecified     cardiac cath 2006..nonobstructive CAD 30-40s lesions.Marland KitchenETT 1/09 nondiagnostic due to poor HR  response..Right Renal Cancer 2003  . History of hysterectomy 1975    FOR BENIGN CAUSES  . Adjustment disorder with anxiety   . Blood in stool   . Secondary cardiomyopathy, unspecified   . Chronic airway obstruction, not elsewhere classified   . Encounter for long-term (current) use of anticoagulants   . Dysuria   . Edema   . Encounter for therapeutic drug monitoring   . Other malaise and fatigue   . Hypertrophic obstructive cardiomyopathy   . Other and unspecified hyperlipidemia   . Urge incontinence   . Other acute sinusitis   . Other and unspecified coagulation defects   . Other screening mammogram   . Pre-operative cardiovascular examination   . Hemorrhage of rectum and anus   . Chronic kidney disease, unspecified   . Special screening for osteoporosis   . Renal cancer 2033    Right  . Malignant neoplasm of kidney, except pelvis     Past Surgical History  Procedure Date  . Insert / replace / remove pacemaker 02/28/05    ICD-MEDTRONIC MAXIMAL DR  . Cataract extraction   . Nephrectomy     RIGHT S/P RENAL  CELL CANCER  . Abdominal hysterectomy 1975    for benign causes  . Cardiac catheterization 2006    Family History  Problem Relation Age of Onset  . Colon cancer Neg Hx     History   Social History  . Marital Status: Widowed    Spouse Name: N/A    Number of Children: N/A  . Years of Education: N/A   Occupational History  . Retired    Social History Main Topics  . Smoking status: Former Smoker -- 0.5 packs/day for 40 years    Types: Cigarettes    Quit date: 05/06/2001  . Smokeless tobacco: Not on file  . Alcohol Use: No  . Drug Use: No  . Sexually Active:    Other Topics Concern  . 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 No distress.  Abdominal: Soft. There is no tenderness.  Musculoskeletal:       No CVA tenderness          Assessment & Plan:

## 2011-03-11 NOTE — Assessment & Plan Note (Signed)
Mild symptoms but she worries due to having only 1 kidney Does have pyuria and hematuria on dipstick Will treat with ceftin Eat yogurt Send culture since she was just on antibiotic

## 2011-03-13 LAB — URINE CULTURE: Colony Count: 80000

## 2011-03-14 ENCOUNTER — Ambulatory Visit (INDEPENDENT_AMBULATORY_CARE_PROVIDER_SITE_OTHER): Payer: Medicare Other | Admitting: Family Medicine

## 2011-03-14 DIAGNOSIS — Z7901 Long term (current) use of anticoagulants: Secondary | ICD-10-CM

## 2011-03-14 DIAGNOSIS — I4891 Unspecified atrial fibrillation: Secondary | ICD-10-CM

## 2011-03-14 DIAGNOSIS — Z5181 Encounter for therapeutic drug level monitoring: Secondary | ICD-10-CM

## 2011-03-14 LAB — POCT INR

## 2011-03-14 NOTE — Patient Instructions (Signed)
2.5mg  every other day while patient is on antibiotics recheck in 1 week

## 2011-03-18 ENCOUNTER — Telehealth: Payer: Self-pay | Admitting: *Deleted

## 2011-03-18 NOTE — Telephone Encounter (Signed)
Not sure what is causing the odor or the nighttime symptoms. If there was any infection, the antibiotic should have cleared it by now If she is not feeling sick, may be best to just wait this out a bit and see if the symptoms clear. Continue lots of fluids but not much after 6PM to see if that helps the nighttime voiding

## 2011-03-18 NOTE — Telephone Encounter (Signed)
Pt called asking for lab result, advised her of results. She finished her antibiotic this morning.  She feels better but says the urine still has an odor to it, even though she is drinking water, and she has to get up 3 or 4 times during the night to go.  Please advise on what you think.

## 2011-03-18 NOTE — Telephone Encounter (Signed)
Spoke with patient and advised results   

## 2011-03-19 ENCOUNTER — Telehealth: Payer: Self-pay

## 2011-03-19 ENCOUNTER — Ambulatory Visit (INDEPENDENT_AMBULATORY_CARE_PROVIDER_SITE_OTHER): Payer: Medicare Other | Admitting: *Deleted

## 2011-03-19 ENCOUNTER — Encounter (INDEPENDENT_AMBULATORY_CARE_PROVIDER_SITE_OTHER): Payer: Medicare Other | Admitting: *Deleted

## 2011-03-19 DIAGNOSIS — I429 Cardiomyopathy, unspecified: Secondary | ICD-10-CM

## 2011-03-19 DIAGNOSIS — E785 Hyperlipidemia, unspecified: Secondary | ICD-10-CM

## 2011-03-19 DIAGNOSIS — I6529 Occlusion and stenosis of unspecified carotid artery: Secondary | ICD-10-CM

## 2011-03-19 NOTE — Telephone Encounter (Signed)
Error for refill

## 2011-03-20 LAB — BASIC METABOLIC PANEL
BUN: 16 mg/dL (ref 6–23)
CO2: 30 mEq/L (ref 19–32)
Calcium: 9.2 mg/dL (ref 8.4–10.5)
Chloride: 98 mEq/L (ref 96–112)
Creat: 1.34 mg/dL — ABNORMAL HIGH (ref 0.50–1.10)
Glucose, Bld: 115 mg/dL — ABNORMAL HIGH (ref 70–99)
Potassium: 3.6 mEq/L (ref 3.5–5.3)
Sodium: 140 mEq/L (ref 135–145)

## 2011-03-20 LAB — LIPID PANEL
Cholesterol: 205 mg/dL — ABNORMAL HIGH (ref 0–200)
HDL: 39 mg/dL — ABNORMAL LOW (ref >39)
LDL Cholesterol: 135 mg/dL — ABNORMAL HIGH (ref 0–99)
Total CHOL/HDL Ratio: 5.3 Ratio
Triglycerides: 157 mg/dL — ABNORMAL HIGH (ref <150)
VLDL: 31 mg/dL (ref 0–40)

## 2011-03-20 LAB — HEPATIC FUNCTION PANEL
ALT: 14 U/L (ref 0–35)
AST: 21 U/L (ref 0–37)
Albumin: 4.1 g/dL (ref 3.5–5.2)
Alkaline Phosphatase: 79 U/L (ref 39–117)
Bilirubin, Direct: 0.1 mg/dL (ref 0.0–0.3)
Indirect Bilirubin: 0.5 mg/dL (ref 0.0–0.9)
Total Bilirubin: 0.6 mg/dL (ref 0.3–1.2)
Total Protein: 6.4 g/dL (ref 6.0–8.3)

## 2011-03-21 ENCOUNTER — Ambulatory Visit (INDEPENDENT_AMBULATORY_CARE_PROVIDER_SITE_OTHER): Payer: Medicare Other | Admitting: Family Medicine

## 2011-03-21 ENCOUNTER — Ambulatory Visit (INDEPENDENT_AMBULATORY_CARE_PROVIDER_SITE_OTHER): Payer: Medicare Other

## 2011-03-21 DIAGNOSIS — I4891 Unspecified atrial fibrillation: Secondary | ICD-10-CM

## 2011-03-21 DIAGNOSIS — Z23 Encounter for immunization: Secondary | ICD-10-CM

## 2011-03-21 DIAGNOSIS — Z7901 Long term (current) use of anticoagulants: Secondary | ICD-10-CM

## 2011-03-21 DIAGNOSIS — Z5181 Encounter for therapeutic drug level monitoring: Secondary | ICD-10-CM

## 2011-03-21 LAB — POCT INR: INR: 2.6

## 2011-03-21 NOTE — Patient Instructions (Signed)
Take 2.5 mg daily recheck 2 weeks

## 2011-03-22 ENCOUNTER — Other Ambulatory Visit: Payer: Self-pay

## 2011-03-22 MED ORDER — HYDROCHLOROTHIAZIDE 25 MG PO TABS: 25.0000 mg | ORAL_TABLET | Freq: Every day | ORAL | Status: DC

## 2011-03-31 ENCOUNTER — Encounter: Payer: Self-pay | Admitting: Cardiovascular Disease

## 2011-04-01 ENCOUNTER — Telehealth: Payer: Self-pay

## 2011-04-01 MED ORDER — CARVEDILOL 12.5 MG PO TABS: 12.5000 mg | ORAL_TABLET | Freq: Two times a day (BID) | ORAL | Status: DC

## 2011-04-01 NOTE — Telephone Encounter (Signed)
Refill sent for carvedilol 12.5 mg take one tablet twice a day.

## 2011-04-02 ENCOUNTER — Telehealth: Payer: Self-pay

## 2011-04-02 MED ORDER — CARVEDILOL 12.5 MG PO TABS: 12.5000 mg | ORAL_TABLET | Freq: Two times a day (BID) | ORAL | Status: DC

## 2011-04-02 NOTE — Telephone Encounter (Signed)
Refill sent for coreg.

## 2011-04-04 ENCOUNTER — Ambulatory Visit: Payer: Medicare Other

## 2011-04-06 DIAGNOSIS — Z9289 Personal history of other medical treatment: Secondary | ICD-10-CM

## 2011-04-06 HISTORY — DX: Personal history of other medical treatment: Z92.89

## 2011-04-07 ENCOUNTER — Encounter: Payer: Self-pay | Admitting: Cardiovascular Disease

## 2011-04-11 ENCOUNTER — Telehealth: Payer: Self-pay

## 2011-04-11 ENCOUNTER — Other Ambulatory Visit: Payer: Self-pay

## 2011-04-11 DIAGNOSIS — E785 Hyperlipidemia, unspecified: Secondary | ICD-10-CM

## 2011-04-11 NOTE — Telephone Encounter (Signed)
Patient is aware to pick up samples of crestor 20 mg told to take two tablets daily.

## 2011-04-23 ENCOUNTER — Other Ambulatory Visit (HOSPITAL_COMMUNITY): Payer: Self-pay | Admitting: Orthopaedic Surgery

## 2011-04-25 ENCOUNTER — Encounter (HOSPITAL_COMMUNITY): Payer: Self-pay | Admitting: Pharmacy Technician

## 2011-05-01 ENCOUNTER — Encounter (HOSPITAL_COMMUNITY): Payer: Self-pay

## 2011-05-01 ENCOUNTER — Encounter (HOSPITAL_COMMUNITY)
Admission: RE | Admit: 2011-05-01 | Discharge: 2011-05-01 | Disposition: A | Discharge status: Home / Self Care | Payer: Medicare Other | Source: Ambulatory Visit | Attending: Orthopaedic Surgery | Admitting: Orthopaedic Surgery

## 2011-05-01 ENCOUNTER — Ambulatory Visit (HOSPITAL_COMMUNITY)
Admission: RE | Admit: 2011-05-01 | Discharge: 2011-05-01 | Disposition: A | Discharge status: Home / Self Care | Payer: Medicare Other | Source: Ambulatory Visit | Attending: Orthopaedic Surgery | Admitting: Orthopaedic Surgery

## 2011-05-01 DIAGNOSIS — Z01810 Encounter for preprocedural cardiovascular examination: Secondary | ICD-10-CM | POA: Insufficient documentation

## 2011-05-01 DIAGNOSIS — Z01818 Encounter for other preprocedural examination: Secondary | ICD-10-CM | POA: Insufficient documentation

## 2011-05-01 DIAGNOSIS — Z01812 Encounter for preprocedural laboratory examination: Secondary | ICD-10-CM | POA: Insufficient documentation

## 2011-05-01 HISTORY — DX: Unspecified osteoarthritis, unspecified site: M19.90

## 2011-05-01 HISTORY — PX: INSERT / REPLACE / REMOVE PACEMAKER: SUR710

## 2011-05-01 LAB — CBC
HCT: 38.6 % (ref 36.0–46.0)
Hemoglobin: 12.6 g/dL (ref 12.0–15.0)
MCH: 28.6 pg (ref 26.0–34.0)
MCHC: 32.6 g/dL (ref 30.0–36.0)
MCV: 87.7 fL (ref 78.0–100.0)
Platelets: 217 10*3/uL (ref 150–400)
RBC: 4.4 MIL/uL (ref 3.87–5.11)
RDW: 13.6 % (ref 11.5–15.5)
WBC: 4.7 10*3/uL (ref 4.0–10.5)

## 2011-05-01 LAB — BASIC METABOLIC PANEL
BUN: 14 mg/dL (ref 6–23)
CO2: 33 mEq/L — ABNORMAL HIGH (ref 19–32)
Calcium: 9.8 mg/dL (ref 8.4–10.5)
Chloride: 99 mEq/L (ref 96–112)
Creatinine, Ser: 1.27 mg/dL — ABNORMAL HIGH (ref 0.50–1.10)
GFR calc Af Amer: 47 mL/min — ABNORMAL LOW (ref >90)
GFR calc non Af Amer: 40 mL/min — ABNORMAL LOW (ref >90)
Glucose, Bld: 85 mg/dL (ref 70–99)
Potassium: 3 mEq/L — ABNORMAL LOW (ref 3.5–5.1)
Sodium: 139 mEq/L (ref 135–145)

## 2011-05-01 LAB — URINALYSIS, ROUTINE W REFLEX MICROSCOPIC
Bilirubin Urine: NEGATIVE
Glucose, UA: NEGATIVE mg/dL
Ketones, ur: NEGATIVE mg/dL
Nitrite: NEGATIVE
Protein, ur: NEGATIVE mg/dL
Specific Gravity, Urine: 1.007 (ref 1.005–1.030)
Urobilinogen, UA: 0.2 mg/dL (ref 0.0–1.0)
pH: 6 (ref 5.0–8.0)

## 2011-05-01 LAB — URINE MICROSCOPIC-ADD ON

## 2011-05-01 LAB — SURGICAL PCR SCREEN
MRSA, PCR: NEGATIVE
Staphylococcus aureus: POSITIVE — AB

## 2011-05-01 NOTE — Pre-Procedure Instructions (Signed)
05-01-11 EKG(02-13-11), Echo report ( 06-08-10) ,Stress test(08-03-09)-report with chart

## 2011-05-01 NOTE — Patient Instructions (Signed)
[place]  05/01/2011   Your procedure is scheduled on:  05-03-11  Report to [facility] at 11:00 AM.  Call this number if you have problems the morning of surgery: [phone]   Remember:   Do not eat food:After Midnight.  May have clear liquids:until Midnight .  Clear liquids include soda, tea, black coffee, apple or grape juice, broth.  Take these medicines the morning of surgery with A SIP OF WATER:Carvedilol, Citerizine, Norpace, Tylenol    Do not wear jewelry, make-up or nail polish.  Do not wear lotions, powders, or perfumes. You may wear deodorant.  Do not shave 48 hours prior to surgery.  Do not bring valuables to the hospital.  Contacts, dentures or bridgework may not be worn into surgery.  Leave suitcase in the car. After surgery it may be brought to your room.  For patients admitted to the hospital, checkout time is 11:00 AM the day of discharge.   Patients discharged the day of surgery will not be allowed to drive home.  Name and phone number of your driver: daughter -[name]   Special Instructions: CHG Shower Use Special Wash: 1/2 bottle night before surgery and 1/2 bottle morning of surgery.   Please read over the following fact sheets that you were given: Blood Transfusion Information and MRSA Information

## 2011-05-01 NOTE — Pre-Procedure Instructions (Signed)
05-01-11 [name], [employer] rep-made aware of procedure. States will not come, but can be reached at [phone] if anesthesia needs assistance.

## 2011-05-03 ENCOUNTER — Inpatient Hospital Stay (HOSPITAL_COMMUNITY): Payer: Medicare Other | Admitting: Anesthesiology

## 2011-05-03 ENCOUNTER — Encounter (HOSPITAL_COMMUNITY): Payer: Self-pay | Admitting: Anesthesiology

## 2011-05-03 ENCOUNTER — Encounter (HOSPITAL_COMMUNITY)
Admission: RE | Disposition: A | Discharge status: Skilled Nursing Facility | Payer: Self-pay | Source: Ambulatory Visit | Attending: Orthopaedic Surgery

## 2011-05-03 ENCOUNTER — Inpatient Hospital Stay (HOSPITAL_COMMUNITY): Payer: Medicare Other

## 2011-05-03 ENCOUNTER — Inpatient Hospital Stay (HOSPITAL_COMMUNITY)
Admission: RE | Admit: 2011-05-03 | Discharge: 2011-05-08 | DRG: 470 | Disposition: A | Discharge status: Skilled Nursing Facility | Payer: Medicare Other | Source: Ambulatory Visit | Attending: Orthopaedic Surgery | Admitting: Orthopaedic Surgery

## 2011-05-03 ENCOUNTER — Encounter (HOSPITAL_COMMUNITY): Payer: Self-pay

## 2011-05-03 DIAGNOSIS — J449 Chronic obstructive pulmonary disease, unspecified: Secondary | ICD-10-CM | POA: Diagnosis present

## 2011-05-03 DIAGNOSIS — M87059 Idiopathic aseptic necrosis of unspecified femur: Principal | ICD-10-CM | POA: Diagnosis present

## 2011-05-03 DIAGNOSIS — I10 Essential (primary) hypertension: Secondary | ICD-10-CM | POA: Diagnosis present

## 2011-05-03 DIAGNOSIS — H5710 Ocular pain, unspecified eye: Secondary | ICD-10-CM | POA: Diagnosis not present

## 2011-05-03 DIAGNOSIS — M87051 Idiopathic aseptic necrosis of right femur: Secondary | ICD-10-CM | POA: Diagnosis present

## 2011-05-03 DIAGNOSIS — D62 Acute posthemorrhagic anemia: Secondary | ICD-10-CM | POA: Diagnosis not present

## 2011-05-03 DIAGNOSIS — Z95810 Presence of automatic (implantable) cardiac defibrillator: Secondary | ICD-10-CM

## 2011-05-03 DIAGNOSIS — Z9889 Other specified postprocedural states: Secondary | ICD-10-CM

## 2011-05-03 DIAGNOSIS — Z85528 Personal history of other malignant neoplasm of kidney: Secondary | ICD-10-CM

## 2011-05-03 DIAGNOSIS — E861 Hypovolemia: Secondary | ICD-10-CM | POA: Diagnosis not present

## 2011-05-03 DIAGNOSIS — Z905 Acquired absence of kidney: Secondary | ICD-10-CM

## 2011-05-03 DIAGNOSIS — R112 Nausea with vomiting, unspecified: Secondary | ICD-10-CM | POA: Diagnosis not present

## 2011-05-03 DIAGNOSIS — J4489 Other specified chronic obstructive pulmonary disease: Secondary | ICD-10-CM | POA: Diagnosis present

## 2011-05-03 HISTORY — PX: TOTAL HIP ARTHROPLASTY: SHX124

## 2011-05-03 HISTORY — DX: Idiopathic aseptic necrosis of unspecified femur: M87.059

## 2011-05-03 LAB — ABO/RH: ABO/RH(D): A NEG

## 2011-05-03 LAB — PROTIME-INR
INR: 1.13 (ref 0.00–1.49)
Prothrombin Time: 14.7 seconds (ref 11.6–15.2)

## 2011-05-03 SURGERY — ARTHROPLASTY, HIP, TOTAL, ANTERIOR APPROACH
Anesthesia: General | Site: Hip | Laterality: Right | Wound class: Clean

## 2011-05-03 MED ORDER — NALOXONE HCL 0.4 MG/ML IJ SOLN: 0.4000 mg | INTRAMUSCULAR | Status: DC | PRN

## 2011-05-03 MED ORDER — CEFAZOLIN SODIUM-DEXTROSE 2-3 GM-% IV SOLR
2.0000 g | Freq: Once | INTRAVENOUS | Status: AC
  Administered 2011-05-03: 2 g via INTRAVENOUS

## 2011-05-03 MED ORDER — NEOSTIGMINE METHYLSULFATE 1 MG/ML IJ SOLN
INTRAMUSCULAR | Status: DC | PRN
  Administered 2011-05-03: 4 mg via INTRAVENOUS

## 2011-05-03 MED ORDER — DEXAMETHASONE 0.1 % OP SUSP
2.0000 [drp] | OPHTHALMIC | Status: AC
  Administered 2011-05-03: 2 [drp] via OPHTHALMIC
  Filled 2011-05-03: qty 5

## 2011-05-03 MED ORDER — ONDANSETRON HCL 4 MG/2ML IJ SOLN
4.0000 mg | Freq: Four times a day (QID) | INTRAMUSCULAR | Status: DC | PRN
  Administered 2011-05-04: 4 mg via INTRAVENOUS
  Filled 2011-05-03 (×3): qty 2

## 2011-05-03 MED ORDER — NAPHAZOLINE-GLYCERIN 0.012-0.2 % OP SOLN
1.0000 [drp] | OPHTHALMIC | Status: DC | PRN
  Filled 2011-05-03: qty 15

## 2011-05-03 MED ORDER — FERROUS SULFATE 325 (65 FE) MG PO TABS
325.0000 mg | ORAL_TABLET | Freq: Three times a day (TID) | ORAL | Status: DC
  Administered 2011-05-04 – 2011-05-06 (×9): 325 mg via ORAL
  Filled 2011-05-03 (×13): qty 1

## 2011-05-03 MED ORDER — MIDAZOLAM HCL 5 MG/5ML IJ SOLN
INTRAMUSCULAR | Status: DC | PRN
  Administered 2011-05-03 (×2): 1 mg via INTRAVENOUS

## 2011-05-03 MED ORDER — PHENYLEPHRINE HCL 10 MG/ML IJ SOLN
INTRAMUSCULAR | Status: DC | PRN
  Administered 2011-05-03 (×4): 50 ug via INTRAVENOUS

## 2011-05-03 MED ORDER — ROCURONIUM BROMIDE 100 MG/10ML IV SOLN
INTRAVENOUS | Status: DC | PRN
  Administered 2011-05-03: 50 mg via INTRAVENOUS
  Administered 2011-05-03: 10 mg via INTRAVENOUS
  Administered 2011-05-03: 5 mg via INTRAVENOUS

## 2011-05-03 MED ORDER — CEFAZOLIN SODIUM 1-5 GM-% IV SOLN
1.0000 g | Freq: Four times a day (QID) | INTRAVENOUS | Status: AC
  Administered 2011-05-03 – 2011-05-04 (×3): 1 g via INTRAVENOUS
  Filled 2011-05-03 (×3): qty 50

## 2011-05-03 MED ORDER — HETASTARCH-ELECTROLYTES 6 % IV SOLN: Freq: Once | INTRAVENOUS | Status: DC

## 2011-05-03 MED ORDER — NAPHAZOLINE-PHENIRAMINE 0.025-0.3 % OP SOLN
1.0000 [drp] | OPHTHALMIC | Status: DC | PRN
  Administered 2011-05-03: 1 [drp] via OPHTHALMIC
  Filled 2011-05-03: qty 15

## 2011-05-03 MED ORDER — LACTATED RINGERS IV SOLN: INTRAVENOUS | Status: DC

## 2011-05-03 MED ORDER — DISOPYRAMIDE PHOSPHATE 150 MG PO CAPS
150.0000 mg | ORAL_CAPSULE | Freq: Two times a day (BID) | ORAL | Status: DC
  Administered 2011-05-03 – 2011-05-08 (×10): 150 mg via ORAL
  Filled 2011-05-03 (×11): qty 1

## 2011-05-03 MED ORDER — WARFARIN SODIUM 2.5 MG PO TABS: 2.5000 mg | ORAL_TABLET | Freq: Every day | ORAL | Status: DC

## 2011-05-03 MED ORDER — METOCLOPRAMIDE HCL 10 MG PO TABS: 5.0000 mg | ORAL_TABLET | Freq: Three times a day (TID) | ORAL | Status: DC | PRN

## 2011-05-03 MED ORDER — WARFARIN SODIUM 5 MG PO TABS
5.0000 mg | ORAL_TABLET | Freq: Once | ORAL | Status: AC
  Administered 2011-05-03: 5 mg via ORAL
  Filled 2011-05-03: qty 1

## 2011-05-03 MED ORDER — ZOLPIDEM TARTRATE 5 MG PO TABS: 5.0000 mg | ORAL_TABLET | Freq: Every evening | ORAL | Status: DC | PRN

## 2011-05-03 MED ORDER — LORATADINE 10 MG PO TABS
10.0000 mg | ORAL_TABLET | Freq: Every day | ORAL | Status: DC
  Administered 2011-05-04 – 2011-05-08 (×6): 10 mg via ORAL
  Filled 2011-05-03 (×5): qty 1

## 2011-05-03 MED ORDER — METHOCARBAMOL 500 MG PO TABS: 500.0000 mg | ORAL_TABLET | Freq: Four times a day (QID) | ORAL | Status: DC | PRN

## 2011-05-03 MED ORDER — LIDOCAINE HCL (CARDIAC) 20 MG/ML IV SOLN
INTRAVENOUS | Status: DC | PRN
  Administered 2011-05-03: 80 mg via INTRAVENOUS

## 2011-05-03 MED ORDER — SIMVASTATIN 20 MG PO TABS
20.0000 mg | ORAL_TABLET | Freq: Every day | ORAL | Status: DC
  Filled 2011-05-03: qty 1

## 2011-05-03 MED ORDER — POTASSIUM CHLORIDE CRYS ER 20 MEQ PO TBCR
20.0000 meq | EXTENDED_RELEASE_TABLET | Freq: Every day | ORAL | Status: DC
  Administered 2011-05-03 – 2011-05-08 (×6): 20 meq via ORAL
  Filled 2011-05-03 (×6): qty 1

## 2011-05-03 MED ORDER — FENTANYL CITRATE 0.05 MG/ML IJ SOLN
25.0000 ug | INTRAMUSCULAR | Status: DC | PRN
  Administered 2011-05-03 (×3): 50 ug via INTRAVENOUS

## 2011-05-03 MED ORDER — METHOCARBAMOL 100 MG/ML IJ SOLN
500.0000 mg | Freq: Four times a day (QID) | INTRAVENOUS | Status: DC | PRN
  Administered 2011-05-03: 500 mg via INTRAVENOUS
  Filled 2011-05-03: qty 5

## 2011-05-03 MED ORDER — HYDROCHLOROTHIAZIDE 25 MG PO TABS
25.0000 mg | ORAL_TABLET | Freq: Every day | ORAL | Status: DC
  Administered 2011-05-04 – 2011-05-08 (×5): 25 mg via ORAL
  Filled 2011-05-03 (×5): qty 1

## 2011-05-03 MED ORDER — HYDROCODONE-ACETAMINOPHEN 5-325 MG PO TABS: 1.0000 | ORAL_TABLET | ORAL | Status: DC | PRN

## 2011-05-03 MED ORDER — DIPHENHYDRAMINE HCL 12.5 MG/5ML PO ELIX: 12.5000 mg | ORAL_SOLUTION | ORAL | Status: DC | PRN

## 2011-05-03 MED ORDER — DOCUSATE SODIUM 100 MG PO CAPS
100.0000 mg | ORAL_CAPSULE | Freq: Two times a day (BID) | ORAL | Status: DC
  Administered 2011-05-03 – 2011-05-08 (×10): 100 mg via ORAL
  Filled 2011-05-03 (×11): qty 1

## 2011-05-03 MED ORDER — COUMADIN BOOK
Freq: Once | Status: AC
  Administered 2011-05-04: 12:00:00
  Filled 2011-05-03: qty 1

## 2011-05-03 MED ORDER — MENTHOL 3 MG MT LOZG: 1.0000 | LOZENGE | OROMUCOSAL | Status: DC | PRN

## 2011-05-03 MED ORDER — GLYCOPYRROLATE 0.2 MG/ML IJ SOLN
INTRAMUSCULAR | Status: DC | PRN
  Administered 2011-05-03: .6 mg via INTRAVENOUS

## 2011-05-03 MED ORDER — ONDANSETRON HCL 4 MG/2ML IJ SOLN
INTRAMUSCULAR | Status: DC | PRN
  Administered 2011-05-03: 4 mg via INTRAVENOUS

## 2011-05-03 MED ORDER — ACETAMINOPHEN 650 MG RE SUPP: 650.0000 mg | Freq: Four times a day (QID) | RECTAL | Status: DC | PRN

## 2011-05-03 MED ORDER — OXYCODONE HCL 5 MG PO TABS: 5.0000 mg | ORAL_TABLET | ORAL | Status: DC | PRN

## 2011-05-03 MED ORDER — DIPHENHYDRAMINE HCL 50 MG/ML IJ SOLN: 12.5000 mg | Freq: Four times a day (QID) | INTRAMUSCULAR | Status: DC | PRN

## 2011-05-03 MED ORDER — FENTANYL CITRATE 0.05 MG/ML IJ SOLN
INTRAMUSCULAR | Status: DC | PRN
  Administered 2011-05-03: 100 ug via INTRAVENOUS
  Administered 2011-05-03: 50 ug via INTRAVENOUS
  Administered 2011-05-03 (×4): 25 ug via INTRAVENOUS

## 2011-05-03 MED ORDER — HETASTARCH-ELECTROLYTES 6 % IV SOLN
INTRAVENOUS | Status: DC | PRN
  Administered 2011-05-03: 15:00:00 via INTRAVENOUS

## 2011-05-03 MED ORDER — DEXAMETHASONE SODIUM PHOSPHATE 10 MG/ML IJ SOLN
INTRAMUSCULAR | Status: DC | PRN
  Administered 2011-05-03: 10 mg via INTRAVENOUS

## 2011-05-03 MED ORDER — VERAPAMIL HCL ER 180 MG PO TBCR
180.0000 mg | EXTENDED_RELEASE_TABLET | Freq: Every day | ORAL | Status: DC
  Administered 2011-05-03 – 2011-05-07 (×5): 180 mg via ORAL
  Filled 2011-05-03 (×6): qty 1

## 2011-05-03 MED ORDER — ONDANSETRON HCL 4 MG/2ML IJ SOLN
4.0000 mg | Freq: Four times a day (QID) | INTRAMUSCULAR | Status: DC | PRN
  Administered 2011-05-03 – 2011-05-07 (×4): 4 mg via INTRAVENOUS
  Filled 2011-05-03 (×2): qty 2

## 2011-05-03 MED ORDER — DIPHENHYDRAMINE HCL 12.5 MG/5ML PO ELIX
12.5000 mg | ORAL_SOLUTION | Freq: Four times a day (QID) | ORAL | Status: DC | PRN
  Filled 2011-05-03: qty 5

## 2011-05-03 MED ORDER — SODIUM CHLORIDE 0.9 % IV SOLN
INTRAVENOUS | Status: DC
  Administered 2011-05-03 – 2011-05-04 (×3): via INTRAVENOUS

## 2011-05-03 MED ORDER — METOCLOPRAMIDE HCL 5 MG/ML IJ SOLN
5.0000 mg | Freq: Three times a day (TID) | INTRAMUSCULAR | Status: DC | PRN
  Administered 2011-05-04: 10 mg via INTRAVENOUS
  Filled 2011-05-03: qty 2

## 2011-05-03 MED ORDER — ACETAMINOPHEN 325 MG PO TABS: 650.0000 mg | ORAL_TABLET | Freq: Four times a day (QID) | ORAL | Status: DC | PRN

## 2011-05-03 MED ORDER — ASPIRIN EC 81 MG PO TBEC
81.0000 mg | DELAYED_RELEASE_TABLET | Freq: Every day | ORAL | Status: DC
  Administered 2011-05-04 – 2011-05-08 (×5): 81 mg via ORAL
  Filled 2011-05-03 (×5): qty 1

## 2011-05-03 MED ORDER — PROPOFOL 10 MG/ML IV BOLUS
INTRAVENOUS | Status: DC | PRN
  Administered 2011-05-03: 110 mg via INTRAVENOUS

## 2011-05-03 MED ORDER — ACETAMINOPHEN 10 MG/ML IV SOLN
INTRAVENOUS | Status: DC | PRN
  Administered 2011-05-03: 1000 mg via INTRAVENOUS

## 2011-05-03 MED ORDER — MORPHINE SULFATE 2 MG/ML IJ SOLN: 1.0000 mg | INTRAMUSCULAR | Status: DC | PRN

## 2011-05-03 MED ORDER — LACTATED RINGERS IV SOLN
INTRAVENOUS | Status: DC | PRN
  Administered 2011-05-03 (×2): via INTRAVENOUS

## 2011-05-03 MED ORDER — ALUM & MAG HYDROXIDE-SIMETH 200-200-20 MG/5ML PO SUSP: 30.0000 mL | ORAL | Status: DC | PRN

## 2011-05-03 MED ORDER — MORPHINE SULFATE (PF) 1 MG/ML IV SOLN
INTRAVENOUS | Status: DC
  Administered 2011-05-03: 1 mg via INTRAVENOUS
  Administered 2011-05-03: 16:00:00 via INTRAVENOUS
  Administered 2011-05-03 – 2011-05-04 (×2): 1 mg via INTRAVENOUS
  Administered 2011-05-04 (×2): 5 mg via INTRAVENOUS
  Administered 2011-05-04: 4 mg via INTRAVENOUS
  Filled 2011-05-03: qty 25

## 2011-05-03 MED ORDER — ONDANSETRON HCL 4 MG PO TABS: 4.0000 mg | ORAL_TABLET | Freq: Four times a day (QID) | ORAL | Status: DC | PRN

## 2011-05-03 MED ORDER — WARFARIN VIDEO
Freq: Once | Status: AC
  Administered 2011-05-04: 12:00:00

## 2011-05-03 MED ORDER — 0.9 % SODIUM CHLORIDE (POUR BTL) OPTIME
TOPICAL | Status: DC | PRN
  Administered 2011-05-03: 1000 mL

## 2011-05-03 MED ORDER — PROMETHAZINE HCL 25 MG/ML IJ SOLN: 6.2500 mg | INTRAMUSCULAR | Status: DC | PRN

## 2011-05-03 MED ORDER — PHENOL 1.4 % MT LIQD: 1.0000 | OROMUCOSAL | Status: DC | PRN

## 2011-05-03 MED ORDER — CARVEDILOL 12.5 MG PO TABS
12.5000 mg | ORAL_TABLET | Freq: Two times a day (BID) | ORAL | Status: DC
  Administered 2011-05-03 – 2011-05-08 (×9): 12.5 mg via ORAL
  Filled 2011-05-03 (×11): qty 1

## 2011-05-03 MED ORDER — ATORVASTATIN CALCIUM 40 MG PO TABS
40.0000 mg | ORAL_TABLET | Freq: Every day | ORAL | Status: DC
  Administered 2011-05-04 – 2011-05-07 (×3): 40 mg via ORAL
  Filled 2011-05-03 (×5): qty 1

## 2011-05-03 MED ORDER — SODIUM CHLORIDE 0.9 % IJ SOLN: 9.0000 mL | INTRAMUSCULAR | Status: DC | PRN

## 2011-05-03 SURGICAL SUPPLY — 34 items
BAG ZIPLOCK 12X15 (MISCELLANEOUS) ×4 IMPLANT
BLADE SAW SGTL 18X1.27X75 (BLADE) ×2 IMPLANT
CELLS DAT CNTRL 66122 CELL SVR (MISCELLANEOUS) ×1 IMPLANT
CLOTH BEACON ORANGE TIMEOUT ST (SAFETY) ×2 IMPLANT
DRAPE C-ARM 42X72 X-RAY (DRAPES) ×2 IMPLANT
DRAPE STERI IOBAN 125X83 (DRAPES) ×2 IMPLANT
DRAPE U-SHAPE 47X51 STRL (DRAPES) ×6 IMPLANT
DRSG MEPILEX BORDER 4X8 (GAUZE/BANDAGES/DRESSINGS) ×4 IMPLANT
DURAPREP 26ML APPLICATOR (WOUND CARE) ×2 IMPLANT
ELECT BLADE TIP CTD 4 INCH (ELECTRODE) ×2 IMPLANT
ELECT REM PT RETURN 9FT ADLT (ELECTROSURGICAL) ×2
ELECTRODE REM PT RTRN 9FT ADLT (ELECTROSURGICAL) ×1 IMPLANT
FACESHIELD LNG OPTICON STERILE (SAFETY) ×8 IMPLANT
GAUZE XEROFORM 1X8 LF (GAUZE/BANDAGES/DRESSINGS) ×4 IMPLANT
GLOVE BIO SURGEON STRL SZ7 (GLOVE) ×2 IMPLANT
GLOVE BIO SURGEON STRL SZ7.5 (GLOVE) ×2 IMPLANT
GLOVE BIOGEL PI IND STRL 7.5 (GLOVE) IMPLANT
GLOVE BIOGEL PI IND STRL 8 (GLOVE) ×1 IMPLANT
GLOVE BIOGEL PI INDICATOR 7.5 (GLOVE)
GLOVE BIOGEL PI INDICATOR 8 (GLOVE) ×1
GLOVE ECLIPSE 7.0 STRL STRAW (GLOVE) ×2 IMPLANT
GOWN STRL REIN XL XLG (GOWN DISPOSABLE) ×4 IMPLANT
KIT BASIN OR (CUSTOM PROCEDURE TRAY) ×2 IMPLANT
PACK TOTAL JOINT (CUSTOM PROCEDURE TRAY) ×2 IMPLANT
PADDING CAST COTTON 6X4 STRL (CAST SUPPLIES) ×2 IMPLANT
RTRCTR WOUND ALEXIS 18CM MED (MISCELLANEOUS) ×2
STAPLER VISISTAT 35W (STAPLE) ×2 IMPLANT
SUT ETHIBOND NAB CT1 #1 30IN (SUTURE) ×4 IMPLANT
SUT VIC AB 1 CT1 36 (SUTURE) ×4 IMPLANT
SUT VIC AB 2-0 CT1 27 (SUTURE) ×3
SUT VIC AB 2-0 CT1 TAPERPNT 27 (SUTURE) ×3 IMPLANT
TOWEL OR 17X26 10 PK STRL BLUE (TOWEL DISPOSABLE) ×4 IMPLANT
TOWEL OR NON WOVEN STRL DISP B (DISPOSABLE) ×2 IMPLANT
TRAY FOLEY CATH 14FRSI W/METER (CATHETERS) ×2 IMPLANT

## 2011-05-03 NOTE — Progress Notes (Addendum)
ANTICOAGULATION CONSULT NOTE - Initial Consult  Pharmacy Consult for Coumadin Indication: VTE prophylaxis, S/P THA  Allergies  Allergen Reactions  . Codeine     REACTION: N \\T \ V  . Sulfonamide Derivatives     REACTION: Dry mouth    Patient Measurements: Height: 5' 7.5" (171.5 cm) Weight: 179 lb 8 oz (81.421 kg) IBW/kg (Calculated) : 62.75    Vital Signs: Temp: 97.4 F (36.3 C) (12/28 1702) Temp src: Oral (12/28 1702) BP: 164/67 mmHg (12/28 1702) Pulse Rate: 71  (12/28 1702)  Labs:  Basename 05/03/11 1150 05/01/11 1220  HGB -- 12.6  HCT -- 38.6  PLT -- 217  APTT -- --  LABPROT 14.7 --  INR 1.13 --  HEPARINUNFRC -- --  CREATININE -- 1.27*  CKTOTAL -- --  CKMB -- --  TROPONINI -- --   Estimated Creatinine Clearance: 42.4 ml/min (by C-G formula based on Cr of 1.27).  Medical History: Past Medical History  Diagnosis Date  . Carotid artery stenosis 09/2007    60-79% bilateral (stable)  . Carotid artery stenosis 10/2008    40-59% R 60-79%   . Chest pain, unspecified   . Automatic implantable cardiac defibrillator in situ   . Atrial fibrillation   . Sinus bradycardia   . Hypotension, unspecified     cardiac cath 2006..nonobstructive CAD 30-40s lesions.Marland KitchenETT 1/09 nondiagnostic due to poor HR response..Right Renal Cancer 2003  . History of hysterectomy 1975    FOR BENIGN CAUSES  . Adjustment disorder with anxiety   . Blood in stool   . Secondary cardiomyopathy, unspecified   . Chronic airway obstruction, not elsewhere classified   . Encounter for long-term (current) use of anticoagulants   . Dysuria   . Edema   . Encounter for therapeutic drug monitoring   . Other malaise and fatigue   . Hypertrophic obstructive cardiomyopathy   . Other and unspecified hyperlipidemia   . Urge incontinence   . Other acute sinusitis   . Other and unspecified coagulation defects   . Other screening mammogram   . Pre-operative cardiovascular examination   . Hemorrhage of  rectum and anus   . Chronic kidney disease, unspecified   . Special screening for osteoporosis   . Renal cancer 05-01-11    2'03-Right  . Malignant neoplasm of kidney, except pelvis   . Shortness of breath 05-01-11    exertional SOB only  . Arthritis 05-01-11    Right hip osteoarthritis    Medications:  Scheduled:    . aspirin  81 mg Oral QPC breakfast  . carvedilol  12.5 mg Oral BID WC  . ceFAZolin (ANCEF) IV  1 g Intravenous Q6H  . ceFAZolin (ANCEF) IV  2 g Intravenous Once  . disopyramide  150 mg Oral BID  . docusate sodium  100 mg Oral BID  . ferrous sulfate  325 mg Oral TID PC  . hydrochlorothiazide  25 mg Oral QPC breakfast  . loratadine  10 mg Oral Daily  . morphine   Intravenous Q4H  . potassium chloride SA  20 mEq Oral Daily  . simvastatin  20 mg Oral Daily  . verapamil  180 mg Oral QHS  . warfarin  2.5 mg Oral Daily  . DISCONTD: hetastarch in lactated electrolyte   Intravenous Once   Infusions:    . sodium chloride    . DISCONTD: lactated ringers    . DISCONTD: lactated ringers     PRN: acetaminophen, acetaminophen, alum & mag hydroxide-simeth, diphenhydrAMINE, diphenhydrAMINE,  diphenhydrAMINE, HYDROcodone-acetaminophen, menthol-cetylpyridinium, methocarbamol(ROBAXIN) IV, methocarbamol, metoCLOPramide (REGLAN) injection, metoCLOPramide, morphine, naloxone, ondansetron (ZOFRAN) IV, ondansetron (ZOFRAN) IV, ondansetron, oxyCODONE, phenol, sodium chloride, zolpidem, DISCONTD: 0.9 % irrigation (POUR BTL), DISCONTD: fentaNYL DISCONTD: promethazine  Assessment: 77 yo F s/p R THA. Pharmacy to dose Coumadin for post op VTE ppx. Pt takes coumadin PTA for hx Afib   Goal of Therapy:  INR 2-3   Plan:  Coumadin 5mg  tonight. Daily INR. Coumadin book and video. Counseling prior to d/c.  Marcell Anger PharmD  [phone] 05/03/2011 5:28 PM

## 2011-05-03 NOTE — Transfer of Care (Deleted)
Immediate Anesthesia Transfer of Care Note  Patient: [name]  Procedure(s) Performed:  TOTAL HIP ARTHROPLASTY ANTERIOR APPROACH - Removal of Cannulated Screws Right Hip, Right Direct Anterior Hip Replacement  Patient Location: PACU  Anesthesia Type: General  Level of Consciousness: sedated, patient cooperative and responds to stimulaton  Airway & Oxygen Therapy: Patient Spontanous Breathing and Patient connected to face mask oxgen  Post-op Assessment: Report given to PACU RN and Post -op Vital signs reviewed and stable  Post vital signs: Reviewed and stable  Complications: No apparent anesthesia complications

## 2011-05-03 NOTE — Op Note (Signed)
NAMEMEILANIE, [name] NO.:  [id number]  MEDICAL RECORD NO.:  [id number]  LOCATION:  [id number]                         FACILITY:  [facility]  PHYSICIAN:  Lind Guest. Ninfa Linden, M.D.DATE OF BIRTH:  Jun 02, 1933  DATE OF PROCEDURE:  05/03/2011 DATE OF DISCHARGE:                              OPERATIVE REPORT   PREOPERATIVE DIAGNOSES: 1. Avascular necrosis of right hip, status post cannulated screw     fixation of femoral neck fracture. 2. Retained hardware, 3 cannulated screws, right hip.  POSTOPERATIVE DIAGNOSES: 1. Avascular necrosis of right hip, status post cannulated screw     fixation of femoral neck fracture. 2. Retained hardware, 3 cannulated screws, right hip.  PROCEDURE: 1. Removal of 3 cannulated screws, right hip. 2. Right direct anterior total hip arthroplasty.  IMPLANTS:  [employer] acetabular component with Gription size 50, size 32 +4 neutral polyethylene liner, size 11 Corail femoral component with collar and HA coating and standard offset, size 32 +5 ceramic hip ball.  SURGEON:  Lind Guest. Ninfa Linden, MD  ANESTHESIA:  General.  BLOOD LOSS:  500 cc.  COMPLICATIONS:  None.  INDICATIONS:  [name] is a 77 year old female who in January of this year had sustained a nondisplaced right femoral neck fracture.  She underwent cannulated screws fixation of this fracture above the knee showed signs of avascular necrosis of the hip.  She understood this was possibility given the nature of femoral neck fractures.  Due to debilitating pain she is having now, she wished to proceed with screw removal in the hip replacement.  She understands the risks and benefits of this and does wish to proceed with surgery.  PROCEDURE DESCRIPTION:  After informed consent obtained, appropriate right hip was marked.  She was brought to the operating room and general anesthesia was obtained while she was on a stretcher.  A Foley catheter was then placed and traction  boots were placed on her feet.  Her right hip was then prepped and draped with DuraPrep, sterile drapes, and sterile stockinette.  Time-out was called to  correct patient, correct right hip.  I then made an incision through the previous incision laterally and dissected down to the 3 screws.  I was able to remove the 3 screws without difficulty.  I then washed this wound out and closed the deep tissue with #1 Vicryl followed by 0 Vicryl in subcutaneous tissue, 2-0 Vicryl in subcuticular tissue, and interrupted staples on the skin.  Next, I returned to direct anterior hip replacement. Incision was made just distal and posterior to the anterior superior iliac spine and carried obliquely down the leg.  I dissected down to the soft tissues and identified the tensor fascia lata.  This was divided longitudinally and I proceeded with a direct anterior approach to the hip.  Cobra retractor was placed around the lateral neck and medial neck and then divided the hip capsule also cauterized the lateral femoral circumflex vessels.  I identified the femoral neck and then made my femoral neck cut with an oscillating saw just proximal to the lesser trochanter.  I removed the femoral head in its entirety.  I then cleaned the acetabular debris and began reaming from a size  42 reamer up to a size 49 with last 2 reamers placed under direct fluoroscopic guidance as well as visualization.  I then placed a real size 50 acetabular component without difficulty.  I placed a real polyethylene liner and turned attention to the femur.  With the leg externally rotated 90 degrees, we extended and adducted to allow access to the femoral canal. Retractors were placed medially and behind the greater trochanter.  I began broaching from a size 8, broached up to a size 11, and 11 was felt to be stable.  I trialed then a 1+ 1.5 hip ball followed by a +5 and +5 was felt to be more stable.  I gave her good leg lengths and  minimal shuck.  She had good stability with internal and external rotation and flexion.  I then removed all trial components and placed the real HA- coated femoral component from [employer] which is a Corail size 11 with standard offset and a collar.  I placed the real ceramic 32 +5 hip ball. We reduced this back into the acetabulum, it was stable.  I copiously irrigated the tissues and closed the joint capsule with interrupted #1 Ethibond suture followed by #1 Vicryl to reapproximate the tensor fascia lata.  2-0 Vicryl in subcutaneous tissue, interrupted staples on the skin.  All final counts were correct.  No known complications noted.  A well-padded sterile dressing was applied.  She was awakened, extubated, and taken to recovery room in stable condition.     Lind Guest. Ninfa Linden, M.D.     CYB/MEDQ  D:  05/03/2011  T:  05/03/2011  Job:  [id number]

## 2011-05-03 NOTE — Anesthesia Postprocedure Evaluation (Signed)
Anesthesia Post Note  Patient: [name]  Procedure(s) Performed:  TOTAL HIP ARTHROPLASTY ANTERIOR APPROACH - Removal of Cannulated Screws Right Hip, Right Direct Anterior Hip Replacement  Anesthesia type: General  Patient location: PACU  Post pain: Pain level controlled  Post assessment: Post-op Vital signs reviewed  Last Vitals:  Filed Vitals:   05/03/11 1643  BP:   Pulse:   Temp:   Resp: 12    Post vital signs: Reviewed  Level of consciousness: sedated  Complications: No apparent anesthesia complications

## 2011-05-03 NOTE — Progress Notes (Signed)
Pt continues to complain of severe burning pain to bilat eyes since awakening from surgery.  Visine drop applied to left eye; pt in extreme pain.  Refuses any further drops, stating "it's burning so bad I can't stand it".  Can barely open eyes from the pain.  Pupils checked, equal and reactive, sclera white.  Denies vision difficulty.  Dr Louanne Skye made aware.

## 2011-05-03 NOTE — Progress Notes (Addendum)
Patient ID: [name], female   DOB: 1934-02-24, 77 y.o.   MRN: [id number] Call from nursing patient complaining of dry itching eyes. Clear eye drops ordered.

## 2011-05-03 NOTE — Brief Op Note (Signed)
05/03/2011  3:20 PM  PATIENT:  [name]  77 y.o. female  PRE-OPERATIVE DIAGNOSIS:  Avascular necrosis right hip post pinning of hip fracture  POST-OPERATIVE DIAGNOSIS:  Avascular necrosis right hip post pinning of hip fracture  PROCEDURE:  Procedure(s): TOTAL HIP ARTHROPLASTY ANTERIOR APPROACH  SURGEON:  Surgeon(s): Mcarthur Rossetti  PHYSICIAN ASSISTANT:   ASSISTANTS: none   ANESTHESIA:   general  EBL:  Total I/O In: 1800 [I.V.:1600; IV Piggyback:200] Out: 600 [Urine:100; Blood:500]  BLOOD ADMINISTERED:none  DRAINS: none   LOCAL MEDICATIONS USED:  NONE  SPECIMEN:  No Specimen  DISPOSITION OF SPECIMEN:  N/A  COUNTS:  YES  TOURNIQUET:  * No tourniquets in log *  DICTATION: .Other Dictation: Dictation Number [id number]  PLAN OF CARE: Admit to inpatient   PATIENT DISPOSITION:  PACU - hemodynamically stable.   Delay start of Pharmacological VTE agent (>24hrs) due to surgical blood loss or risk of bleeding:  {YES/NO/NOT APPLICABLE:20182

## 2011-05-03 NOTE — Progress Notes (Addendum)
Patient is complaining of eye burning, bilateral with itching. Keeping eyes closed to Decrease pain. Pain is moderately severe. Discussed with Dr. Georjean Mode Anesthesiology he notes that no eye gel or artificial eye gel was used with this patient. She does have a large amount of eye lid make up.  I called Dr. Lucita Ferrara The ophthamologist oncall he recommended Allrex, a ophthal. Solution with corticosteroid. Unfortunately this is a non formulary medication and would take 24-48 hours to obtain Will substitue a dexamethasone ophthal solution Maxidex at the same advised rate. Irrigation may also be helpful. There is no erythrema either sclerae, mild sclerae edema present bilateral, film Over sclerae. Pupils are reactive and equal at mid dialation size. Obvious make Up over eyelids. Assessment: Sclerae edema and irritation, either secondary to dry eye during  Surgery or eye make up irritation.

## 2011-05-03 NOTE — Transfer of Care (Signed)
Immediate Anesthesia Transfer of Care Note  Patient: [name]  Procedure(s) Performed:  TOTAL HIP ARTHROPLASTY ANTERIOR APPROACH - Removal of Cannulated Screws Right Hip, Right Direct Anterior Hip Replacement  Patient Location: PACU  Anesthesia Type: General  Level of Consciousness: sedated, patient cooperative and responds to stimulaton  Airway & Oxygen Therapy: Patient Spontanous Breathing and Patient connected to face mask oxgen  Post-op Assessment: Report given to PACU RN and Post -op Vital signs reviewed and stable  Post vital signs: Reviewed and stable  Complications: No apparent anesthesia complications

## 2011-05-03 NOTE — H&P (Signed)
[name] is an 77 y.o. female.   Chief Complaint:   Severe right hip pain  HPI: 77 yo female who sustained a right hip femoral neck fracture in January of this year.  Underwent cannulated screw fixation to stabilize the fracture.  She had been doing well for a while, but has been experiencing debilitating right hip pain.  X-ray and CT scan worrisome for AVN of right hip.  She now wishes to proceed with a right total hip replacement given her severe pain, decreased mobility, and decreased quality of life.  She understands the risks of acute blood loss, DVT, PE and death given past cadriac issues.  She wishes to proceed.  Our goal is decreased pain, increased mobility, and increased quality of life.  Past Medical History  Diagnosis Date  . Carotid artery stenosis 09/2007    60-79% bilateral (stable)  . Carotid artery stenosis 10/2008    40-59% R 60-79%   . Chest pain, unspecified   . Automatic implantable cardiac defibrillator in situ   . Atrial fibrillation   . Sinus bradycardia   . Hypotension, unspecified     cardiac cath 2006..nonobstructive CAD 30-40s lesions.Marland KitchenETT 1/09 nondiagnostic due to poor HR response..Right Renal Cancer 2003  . History of hysterectomy 1975    FOR BENIGN CAUSES  . Adjustment disorder with anxiety   . Blood in stool   . Secondary cardiomyopathy, unspecified   . Chronic airway obstruction, not elsewhere classified   . Encounter for long-term (current) use of anticoagulants   . Dysuria   . Edema   . Encounter for therapeutic drug monitoring   . Other malaise and fatigue   . Hypertrophic obstructive cardiomyopathy   . Other and unspecified hyperlipidemia   . Urge incontinence   . Other acute sinusitis   . Other and unspecified coagulation defects   . Other screening mammogram   . Pre-operative cardiovascular examination   . Hemorrhage of rectum and anus   . Chronic kidney disease, unspecified   . Special screening for osteoporosis   . Renal cancer  05-01-11    2'03-Right  . Malignant neoplasm of kidney, except pelvis   . Shortness of breath 05-01-11    exertional SOB only  . Arthritis 05-01-11    Right hip osteoarthritis    Past Surgical History  Procedure Date  . Abdominal hysterectomy 1975    for benign causes  . Cardiac catheterization 2006  . Nephrectomy     2'03-RIGHT S/P RENAL CELL CANCER  . Cataract extraction 05-01-11    bil. with lens implant  . Insert / replace / remove pacemaker 05-01-11    02-28-05-/05-04-10-ICD-MEDTRONIC MAXIMAL DR  . Eye surgery 05-01-11    bil. cataract only  . Appendectomy     Family History  Problem Relation Age of Onset  . Colon cancer Neg Hx    Social History:  reports that she quit smoking about 9 years ago. Her smoking use included Cigarettes. She has a 20 pack-year smoking history. She does not have any smokeless tobacco history on file. She reports that she does not drink alcohol or use illicit drugs.  Allergies:  Allergies  Allergen Reactions  . Codeine     REACTION: N \\T \ V  . Sulfonamide Derivatives     REACTION: Dry mouth    No current facility-administered medications on file as of .   Medications Prior to Admission  Medication Sig Dispense Refill  . aspirin 81 MG EC tablet Take 81 mg by  mouth daily after breakfast.       . carvedilol (COREG) 12.5 MG tablet Take 1 tablet (12.5 mg total) by mouth 2 (two) times daily with a meal.  60 tablet  3  . disopyramide (NORPACE) 150 MG capsule Take 1 capsule (150 mg total) by mouth 2 (two) times daily.  60 capsule  6  . furosemide (LASIX) 20 MG tablet Take 20 mg by mouth daily as needed. Fluid       . hydrochlorothiazide (HYDRODIURIL) 25 MG tablet Take 25 mg by mouth daily after breakfast.       . potassium chloride SA (KLOR-CON M20) 20 MEQ tablet Take 20 mEq by mouth daily.       . verapamil (CALAN-SR) 180 MG CR tablet Take 180 mg by mouth at bedtime.       Marland Kitchen warfarin (COUMADIN) 5 MG tablet Take 2.5 mg by mouth daily.          Results for orders placed during the hospital encounter of 05/01/11 (from the past 48 hour(s))  SURGICAL PCR SCREEN     Status: Abnormal   Collection Time   05/01/11 12:03 PM      Component Value Range Comment   MRSA, PCR NEGATIVE  NEGATIVE     Staphylococcus aureus POSITIVE (*) NEGATIVE    URINALYSIS, ROUTINE W REFLEX MICROSCOPIC     Status: Abnormal   Collection Time   05/01/11 12:10 PM      Component Value Range Comment   Color, Urine YELLOW  YELLOW     APPearance CLEAR  CLEAR     Specific Gravity, Urine 1.007  1.005 - 1.030     pH 6.0  5.0 - 8.0     Glucose, UA NEGATIVE  NEGATIVE (mg/dL)    Hgb urine dipstick MODERATE (*) NEGATIVE     Bilirubin Urine NEGATIVE  NEGATIVE     Ketones, ur NEGATIVE  NEGATIVE (mg/dL)    Protein, ur NEGATIVE  NEGATIVE (mg/dL)    Urobilinogen, UA 0.2  0.0 - 1.0 (mg/dL)    Nitrite NEGATIVE  NEGATIVE     Leukocytes, UA SMALL (*) NEGATIVE    URINE MICROSCOPIC-ADD ON     Status: Abnormal   Collection Time   05/01/11 12:10 PM      Component Value Range Comment   Squamous Epithelial / LPF FEW (*) RARE     WBC, UA 3-6  <3 (WBC/hpf)    RBC / HPF 7-10  <3 (RBC/hpf)    Bacteria, UA FEW (*) RARE    BASIC METABOLIC PANEL     Status: Abnormal   Collection Time   05/01/11 12:20 PM      Component Value Range Comment   Sodium 139  135 - 145 (mEq/L)    Potassium 3.0 (*) 3.5 - 5.1 (mEq/L)    Chloride 99  96 - 112 (mEq/L)    CO2 33 (*) 19 - 32 (mEq/L)    Glucose, Bld 85  70 - 99 (mg/dL)    BUN 14  6 - 23 (mg/dL)    Creatinine, Ser 1.27 (*) 0.50 - 1.10 (mg/dL)    Calcium 9.8  8.4 - 10.5 (mg/dL)    GFR calc non Af Amer 40 (*) >90 (mL/min)    GFR calc Af Amer 47 (*) >90 (mL/min)   CBC     Status: Normal   Collection Time   05/01/11 12:20 PM      Component Value Range Comment   WBC 4.7  4.0 -  10.5 (K/uL)    RBC 4.40  3.87 - 5.11 (MIL/uL)    Hemoglobin 12.6  12.0 - 15.0 (g/dL)    HCT 38.6  36.0 - 46.0 (%)    MCV 87.7  78.0 - 100.0 (fL)    MCH 28.6   26.0 - 34.0 (pg)    MCHC 32.6  30.0 - 36.0 (g/dL)    RDW 13.6  11.5 - 15.5 (%)    Platelets 217  150 - 400 (K/uL)    Dg Chest 2 View  05/01/2011  *RADIOLOGY REPORT*  Clinical Data: Preoperative respiratory films.  CHEST - 2 VIEW  Comparison: CT chest 06/07/2010 and plain films of the chest 06/02/2010.  Findings: AICD remains in place.  Lungs are clear.  Heart size upper normal.  No pneumothorax or effusion.  IMPRESSION: No acute disease.  Original Report Authenticated By: Arvid Right. Luther Parody, M.D.    Review of Systems  All other systems reviewed and are negative.    There were no vitals taken for this visit. Physical Exam  Constitutional: She is oriented to person, place, and time. She appears well-developed and well-nourished.  HENT:  Head: Normocephalic and atraumatic.  Eyes: EOM are normal. Pupils are equal, round, and reactive to light.  Neck: Normal range of motion. Neck supple.  Cardiovascular: Normal rate and regular rhythm.   Murmur heard. Respiratory: Effort normal and breath sounds normal.  GI: Soft. Bowel sounds are normal.  Musculoskeletal:       Right hip: She exhibits decreased range of motion, decreased strength, bony tenderness and crepitus.  Neurological: She is alert and oriented to person, place, and time.  Skin: Skin is warm.     Assessment/Plan To the OR today for removal of screws from her right hip followed by a right total hip replacement.  Jedrek Dinovo Y 05/03/2011, 9:52 AM

## 2011-05-03 NOTE — Anesthesia Preprocedure Evaluation (Addendum)
Anesthesia Evaluation  Patient identified by MRN, date of birth, ID band Patient awake    Reviewed: Allergy & Precautions, H&P , NPO status , Patient's Chart, lab work & pertinent test results, reviewed documented beta blocker date and time   History of Anesthesia Complications (+) PONV  Airway  TM Distance: >3 FB Neck ROM: Full    Dental No notable dental hx. (+) Lower Dentures, Partial Upper and Dental Advisory Given   Pulmonary shortness of breath, COPD clear to auscultation  Pulmonary exam normal       Cardiovascular Exercise Tolerance: Good hypertension, Pt. on medications + CAD (nonobstructive CAD) and neg cardio ROS + dysrhythmias Atrial Fibrillation + pacemaker + Cardiac Defibrillator Regular Normal    Neuro/Psych PSYCHIATRIC DISORDERS Bilateral carotid artery stenosis Negative Neurological ROS  Negative Psych ROS   GI/Hepatic negative GI ROS, Neg liver ROS,   Endo/Other  Negative Endocrine ROS  Renal/GU Renal InsufficiencyRenal diseaseS/P right nephrectomy secondary cancer in 2/03; chronic renal insufficiency  Genitourinary negative   Musculoskeletal negative musculoskeletal ROS (+)   Abdominal Normal abdominal exam  (+)   Peds negative pediatric ROS (+)  Hematology negative hematology ROS (+)   Anesthesia Other Findings   Reproductive/Obstetrics negative OB ROS                          Anesthesia Physical Anesthesia Plan  ASA: III  Anesthesia Plan: General   Post-op Pain Management:    Induction: Intravenous  Airway Management Planned: Oral ETT  Additional Equipment:   Intra-op Plan:   Post-operative Plan: Extubation in OR  Informed Consent: I have reviewed the patients History and Physical, chart, labs and discussed the procedure including the risks, benefits and alternatives for the proposed anesthesia with the patient or authorized representative who has indicated  his/her understanding and acceptance.   Dental advisory given  Plan Discussed with: CRNA  Anesthesia Plan Comments:         Anesthesia Quick Evaluation

## 2011-05-04 ENCOUNTER — Inpatient Hospital Stay (HOSPITAL_COMMUNITY): Payer: Medicare Other

## 2011-05-04 DIAGNOSIS — M87051 Idiopathic aseptic necrosis of right femur: Secondary | ICD-10-CM | POA: Diagnosis present

## 2011-05-04 LAB — BASIC METABOLIC PANEL
BUN: 14 mg/dL (ref 6–23)
CO2: 31 mEq/L (ref 19–32)
Calcium: 8.7 mg/dL (ref 8.4–10.5)
Chloride: 102 mEq/L (ref 96–112)
Creatinine, Ser: 1.21 mg/dL — ABNORMAL HIGH (ref 0.50–1.10)
GFR calc Af Amer: 49 mL/min — ABNORMAL LOW (ref >90)
GFR calc non Af Amer: 43 mL/min — ABNORMAL LOW (ref >90)
Glucose, Bld: 127 mg/dL — ABNORMAL HIGH (ref 70–99)
Potassium: 4.1 mEq/L (ref 3.5–5.1)
Sodium: 140 mEq/L (ref 135–145)

## 2011-05-04 LAB — PROTIME-INR
INR: 1.23 (ref 0.00–1.49)
Prothrombin Time: 15.8 seconds — ABNORMAL HIGH (ref 11.6–15.2)

## 2011-05-04 LAB — CBC
HCT: 26.4 % — ABNORMAL LOW (ref 36.0–46.0)
Hemoglobin: 8.8 g/dL — ABNORMAL LOW (ref 12.0–15.0)
MCH: 29.3 pg (ref 26.0–34.0)
MCHC: 33.3 g/dL (ref 30.0–36.0)
MCV: 88 fL (ref 78.0–100.0)
Platelets: 163 10*3/uL (ref 150–400)
RBC: 3 MIL/uL — ABNORMAL LOW (ref 3.87–5.11)
RDW: 14 % (ref 11.5–15.5)
WBC: 6.4 10*3/uL (ref 4.0–10.5)

## 2011-05-04 MED ORDER — FENTANYL 12 MCG/HR TD PT72
12.5000 ug | MEDICATED_PATCH | TRANSDERMAL | Status: DC
  Administered 2011-05-04: 12.5 ug via TRANSDERMAL
  Filled 2011-05-04: qty 1

## 2011-05-04 MED ORDER — WARFARIN SODIUM 5 MG PO TABS
5.0000 mg | ORAL_TABLET | Freq: Once | ORAL | Status: AC
  Administered 2011-05-04: 5 mg via ORAL
  Filled 2011-05-04: qty 1

## 2011-05-04 MED ORDER — TRAMADOL HCL 50 MG PO TABS: 100.0000 mg | ORAL_TABLET | Freq: Four times a day (QID) | ORAL | Status: DC | PRN

## 2011-05-04 NOTE — Progress Notes (Signed)
Slept intermittently.  Eyes open without difficulty.  Complains of some burning sensation, but "not as bad as yesterday".  Continues to refuses eye drops or treatments offered.  No vision impairment.

## 2011-05-04 NOTE — Progress Notes (Addendum)
Subjective: 77 year old female post op day#1 right total hip replacement via anterior approach with removal of cannnulated screws for AVN post pin fixation of nondisplaced femoral neck fracture. Severe eye pain Both eyes yesterday post op. Clear eye drops worsened the pain. No artificial tear gel used during surgery. Both Dr. Winfred Leeds, anesthesia and Dr. Lucita Ferrara, ophthamology were contacted. Patient Felt pain with Maxidex (corticosteroid eye drops) drops so she has refused further eye drops. She is Much better this morning. Able to keep eyes open, less swelling. Will avoid eye make up while in  Hospital. Likely this is either due to descication of eyes during surgery or related to irritation from Foreign material like eye make up. No nausea or vomiting. No skin rash. Decreased sats with moving to chair this am. Sats are 97% on 2 liters.No SOB just monitor noted.   Objective: Vital signs in last 24 hours: Temp:  [97.4 F (36.3 C)-98.4 F (36.9 C)] 98.4 F (36.9 C) (12/29 0510) Pulse Rate:  [60-71] 60  (12/29 1000) Resp:  [11-20] 18  (12/29 0802) BP: (86-170)/(49-80) 95/59 mmHg (12/29 1110) SpO2:  [96 %-100 %] 100 % (12/29 1050) Weight:  [81.421 kg (179 lb 8 oz)] 179 lb 8 oz (81.421 kg) (12/28 1702)  Intake/Output from previous day: 12/28 0701 - 12/29 0700 In: 2896.3 [I.V.:2596.3; IV Piggyback:300] Out: 1250 [Urine:750; Blood:500] Intake/Output this shift:     Basename 05/04/11 0540 05/01/11 1220  HGB 8.8* 12.6    Basename 05/04/11 0540 05/01/11 1220  WBC 6.4 4.7  RBC 3.00* 4.40  HCT 26.4* 38.6  PLT 163 217    Basename 05/04/11 0540 05/01/11 1220  NA 140 139  K 4.1 3.0*  CL 102 99  CO2 31 33*  BUN 14 14  CREATININE 1.21* 1.27*  GLUCOSE 127* 85  CALCIUM 8.7 9.8    Basename 05/04/11 0540 05/03/11 1150  LABPT -- --  INR 1.23 1.13    Neurologically intact ABD soft Neurovascular intact Sensation intact distally Intact pulses distally Dorsiflexion/Plantar  flexion intact Incision: dressing C/D/I Compartment soft Dressing with minimal dry blood left intact.   Assessment/Plan: Post op day#1 Right THR via anterior approach. Sclerae irritation during anesthesia and following, suspect foreign material reaction (eye make up) or  dry eyes occuring during anesthesia. This is improving , no sign of physical trauma  or acute iritis.  Plan.1) Keep IVF as H/H shows decline mildly post op. 2)O2 pnc for intermittent decreased sats. 3)Start incentive spirometry. 4)Check PCXR h/o A fib. 5)PT OT 6)Change dressing on POD#2 7)Complains of severe nausea and vomiting with Morphine apparently true of last hip surgery as 8)Well, D/C Morphine, try Fentanyl patch and intermittant tramadol.  Arrion Burruel E 05/04/2011, 11:48 AM

## 2011-05-04 NOTE — Progress Notes (Signed)
Physical Therapy Evaluation Patient Details Name: [name] MRN: [id number] DOB: 08-09-1933 Today's Date: 05/04/2011 [id number] EVII  Pt planning to got to [facility] for short term rehab  Problem List:  Patient Active Problem List  Diagnoses  . CARCINOMA, RENAL CELL  . HYPERLIPIDEMIA  . OTHER AND UNSPECIFIED COAGULATION DEFECTS  . ADJUSTMENT DISORDER WITH ANXIETY  . HOCM / IHSS  . CARDIOMYOPATHY, SECONDARY  . ATRIAL FIBRILLATION  . SINUS BRADYCARDIA  . CAROTID ARTERY STENOSIS  . HYPOTENSION  . OTHER ACUTE SINUSITIS  . COPD  . RECTAL BLEEDING  . BLOOD IN STOOL  . RENAL INSUFFICIENCY, CHRONIC  . FATIGUE  . EDEMA  . CHEST PAIN-UNSPECIFIED  . DYSURIA  . INCONTINENCE, URGE  . ICD - IN SITU  . Cough  . Avascular necrosis of hip    Past Medical History:  Past Medical History  Diagnosis Date  . Carotid artery stenosis 09/2007    60-79% bilateral (stable)  . Carotid artery stenosis 10/2008    40-59% R 60-79%   . Chest pain, unspecified   . Automatic implantable cardiac defibrillator in situ   . Atrial fibrillation   . Sinus bradycardia   . Hypotension, unspecified     cardiac cath 2006..nonobstructive CAD 30-40s lesions.Marland KitchenETT 1/09 nondiagnostic due to poor HR response..Right Renal Cancer 2003  . History of hysterectomy 1975    FOR BENIGN CAUSES  . Adjustment disorder with anxiety   . Blood in stool   . Secondary cardiomyopathy, unspecified   . Chronic airway obstruction, not elsewhere classified   . Encounter for long-term (current) use of anticoagulants   . Dysuria   . Edema   . Encounter for therapeutic drug monitoring   . Other malaise and fatigue   . Hypertrophic obstructive cardiomyopathy   . Other and unspecified hyperlipidemia   . Urge incontinence   . Other acute sinusitis   . Other and unspecified coagulation defects   . Other screening mammogram   . Pre-operative cardiovascular examination   . Hemorrhage of rectum and anus   . Chronic  kidney disease, unspecified   . Special screening for osteoporosis   . Renal cancer 05-01-11    2'03-Right  . Malignant neoplasm of kidney, except pelvis   . Shortness of breath 05-01-11    exertional SOB only  . Arthritis 05-01-11    Right hip osteoarthritis   Past Surgical History:  Past Surgical History  Procedure Date  . Abdominal hysterectomy 1975    for benign causes  . Cardiac catheterization 2006  . Nephrectomy     2'03-RIGHT S/P RENAL CELL CANCER  . Cataract extraction 05-01-11    bil. with lens implant  . Insert / replace / remove pacemaker 05-01-11    02-28-05-/05-04-10-ICD-MEDTRONIC MAXIMAL DR  . Eye surgery 05-01-11    bil. cataract only  . Appendectomy     PT Assessment/Plan/Recommendation PT Assessment Clinical Impression Statement: pt having some difficulty with nausea and hypotension first day post op, possibly from pain medication.  Hopefully, patient will improve in mobility and D/C to ST SNF for continued PT prior to return to home PT Recommendation/Assessment: Patient will need skilled PT in the acute care venue PT Problem List: Decreased activity tolerance;Decreased mobility Barriers to Discharge: Decreased caregiver support PT Therapy Diagnosis : Acute pain;Generalized weakness;Difficulty walking PT Plan PT Frequency: 7X/week PT Treatment/Interventions: DME instruction;Gait training;Functional mobility training;Therapeutic activities;Therapeutic exercise;Patient/family education PT Recommendation Follow Up Recommendations: Skilled nursing facility Equipment Recommended: Defer to next venue PT Goals  Acute Rehab PT Goals PT Goal Formulation: With patient Time For Goal Achievement: 2 weeks Pt will go Supine/Side to Sit: with min assist PT Goal: Supine/Side to Sit - Progress: Not met Pt will go Sit to Stand: with min assist PT Goal: Sit to Stand - Progress: Not met Pt will Ambulate: 51 - 150 feet PT Goal: Ambulate - Progress: Not met Pt will  Perform Home Exercise Program: with supervision, verbal cues required/provided PT Goal: Perform Home Exercise Program - Progress: Not met  PT Evaluation Precautions/Restrictions  Restrictions Weight Bearing Restrictions: Yes RLE Weight Bearing: Weight bearing as tolerated Prior Functioning  Home Living Lives With: Family (59 yo grandson) Marine scientist Help From: Family Type of Home: House Home Layout: One level Home Access: Stairs to enter Entrance Stairs-Rails: None (grandson is supposed to take care of that) Technical brewer of Steps: 2 Prior Function Level of Independence: Requires assistive device for independence (cane) Cognition Cognition Orientation Level: Oriented X4 Sensation/Coordination   Extremity Assessment RLE Assessment RLE Assessment: Exceptions to [facility] RLE AROM (degrees) RLE Overall AROM Comments: pt able to activate all muscles, Need assist to move leg toward edge of bed LLE Assessment LLE Assessment: Within Functional Limits Mobility (including Balance) Bed Mobility Bed Mobility: Yes Supine to Sit: 1: +2 Total assist (pt= 50%,pt c/o nausea throughout and dizziness upon sitting ) Sitting - Scoot to Edge of Bed: 3: Mod assist Transfers Sit to Stand: 1: +2 Total assist (pt=50%) Sit to Stand Details (indicate cue type and reason): needs assist to lift hips off bed Stand to Sit: 3: Mod assist Ambulation/Gait Ambulation/Gait Assistance: 3: Mod assist Ambulation/Gait Assistance Details (indicate cue type and reason): able to bear weight and step around to chair Ambulation Distance (Feet): 2 Feet Stairs: No Wheelchair Mobility Wheelchair Mobility: No  Posture/Postural Control Posture/Postural Control:  (pt limited by postural hypotension) Exercise    End of Session PT - End of Session Equipment Utilized During Treatment:  (RW) Activity Tolerance: Treatment limited secondary to medical complications (Comment) (limited by nausea, dizziness and postural  hypostensio (86/49) Patient left: in chair Nurse Communication: Mobility status for transfers;Mobility status for ambulation General Behavior During Session: [facility] for tasks performed Cognition: [facility] for tasks performed  [name] 05/04/2011, 1:19 PM

## 2011-05-04 NOTE — Progress Notes (Addendum)
Pt with eyes more open at present time, still c/o some burning sensation but states "I can open my eyes more".  Upon applying newly ordered Dexamethasone drop to right eye, pt immediately complains of severe burning once again.  Pt refuses any further drops.  Offered cool compress, saline flush; pt refuses any treatment at present time.  Dr Louanne Skye paged.

## 2011-05-04 NOTE — Progress Notes (Signed)
ANTICOAGULATION CONSULT NOTE - Follow Up Consult  Pharmacy Consult for Coumadin Indication: VTE prophylaxis s/p THA  Allergies  Allergen Reactions  . Codeine     REACTION: N \\T \ V  . Sulfonamide Derivatives     REACTION: Dry mouth    Patient Measurements: Height: 5' 7.5" (171.5 cm) Weight: 179 lb 8 oz (81.421 kg) IBW/kg (Calculated) : 62.75    Vital Signs: Temp: 98.4 F (36.9 C) (12/29 0510) BP: 115/66 mmHg (12/29 1000) Pulse Rate: 60  (12/29 1000)  Labs:  Basename 05/04/11 0540 05/03/11 1150 05/01/11 1220  HGB 8.8* -- 12.6  HCT 26.4* -- 38.6  PLT 163 -- 217  APTT -- -- --  LABPROT 15.8* 14.7 --  INR 1.23 1.13 --  HEPARINUNFRC -- -- --  CREATININE 1.21* -- 1.27*  CKTOTAL -- -- --  CKMB -- -- --  TROPONINI -- -- --   Estimated Creatinine Clearance: 44.5 ml/min (by C-G formula based on Cr of 1.21).   Medications:  Scheduled:    . aspirin EC  81 mg Oral QPC breakfast  . atorvastatin  40 mg Oral q1800  . carvedilol  12.5 mg Oral BID WC  . ceFAZolin (ANCEF) IV  1 g Intravenous Q6H  . ceFAZolin (ANCEF) IV  2 g Intravenous Once  . coumadin book   Does not apply Once  . dexamethasone  2 drop Both Eyes Q3H  . disopyramide  150 mg Oral BID  . docusate sodium  100 mg Oral BID  . ferrous sulfate  325 mg Oral TID PC  . hydrochlorothiazide  25 mg Oral QPC breakfast  . loratadine  10 mg Oral Daily  . morphine   Intravenous Q4H  . potassium chloride SA  20 mEq Oral Daily  . verapamil  180 mg Oral QHS  . warfarin  5 mg Oral Once  . warfarin   Does not apply Once  . DISCONTD: hetastarch in lactated electrolyte   Intravenous Once  . DISCONTD: simvastatin  20 mg Oral q1800  . DISCONTD: warfarin  2.5 mg Oral Daily   Infusions:    . sodium chloride 75 mL/hr at 05/04/11 1003  . DISCONTD: lactated ringers    . DISCONTD: lactated ringers     PRN: acetaminophen, acetaminophen, alum & mag hydroxide-simeth, diphenhydrAMINE, diphenhydrAMINE, diphenhydrAMINE,  HYDROcodone-acetaminophen, menthol-cetylpyridinium, methocarbamol(ROBAXIN) IV, methocarbamol, metoCLOPramide (REGLAN) injection, metoCLOPramide, morphine, naloxone, ondansetron (ZOFRAN) IV, ondansetron (ZOFRAN) IV, ondansetron, oxyCODONE, phenol, sodium chloride, zolpidem, DISCONTD: 0.9 % irrigation (POUR BTL), DISCONTD: fentaNYL DISCONTD: naphazoline-glycerin, DISCONTD: naphazoline-pheniramine, DISCONTD: promethazine  Assessment:  77 yo F s/p R THA with hx A.fib on coumadin PTA (held for surgery).  Home dose dose = 2.5 mg daily  INR subtherapeutic but trending toward goal 1.23 < 1.13.   Post op Hgb decreased 8.8.  No bleeding/complications reported   Goal of Therapy:  INR 2-3   Plan:  Repeat coumadin 5 mg po x 1 tonight and f/u am PT/INR  Dontai Pember, Gaye Alken PharmD 10:44 AM 05/04/2011

## 2011-05-05 LAB — BASIC METABOLIC PANEL
BUN: 24 mg/dL — ABNORMAL HIGH (ref 6–23)
CO2: 25 mEq/L (ref 19–32)
Calcium: 8.1 mg/dL — ABNORMAL LOW (ref 8.4–10.5)
Chloride: 94 mEq/L — ABNORMAL LOW (ref 96–112)
Creatinine, Ser: 1.73 mg/dL — ABNORMAL HIGH (ref 0.50–1.10)
GFR calc Af Amer: 32 mL/min — ABNORMAL LOW (ref >90)
GFR calc non Af Amer: 28 mL/min — ABNORMAL LOW (ref >90)
Glucose, Bld: 130 mg/dL — ABNORMAL HIGH (ref 70–99)
Potassium: 4 mEq/L (ref 3.5–5.1)
Sodium: 130 mEq/L — ABNORMAL LOW (ref 135–145)

## 2011-05-05 LAB — CBC
HCT: 27.2 % — ABNORMAL LOW (ref 36.0–46.0)
Hemoglobin: 8.8 g/dL — ABNORMAL LOW (ref 12.0–15.0)
MCH: 28.8 pg (ref 26.0–34.0)
MCHC: 32.4 g/dL (ref 30.0–36.0)
MCV: 88.9 fL (ref 78.0–100.0)
Platelets: 161 10*3/uL (ref 150–400)
RBC: 3.06 MIL/uL — ABNORMAL LOW (ref 3.87–5.11)
RDW: 14.5 % (ref 11.5–15.5)
WBC: 8.3 10*3/uL (ref 4.0–10.5)

## 2011-05-05 LAB — PROTIME-INR
INR: 2.39 — ABNORMAL HIGH (ref 0.00–1.49)
Prothrombin Time: 26.5 seconds — ABNORMAL HIGH (ref 11.6–15.2)

## 2011-05-05 MED ORDER — WARFARIN SODIUM 2.5 MG PO TABS
2.5000 mg | ORAL_TABLET | Freq: Once | ORAL | Status: AC
  Administered 2011-05-05: 2.5 mg via ORAL
  Filled 2011-05-05: qty 1

## 2011-05-05 MED ORDER — KCL IN DEXTROSE-NACL 10-5-0.45 MEQ/L-%-% IV SOLN
INTRAVENOUS | Status: DC
  Administered 2011-05-05 – 2011-05-07 (×5): via INTRAVENOUS
  Filled 2011-05-05 (×10): qty 1000

## 2011-05-05 NOTE — Progress Notes (Signed)
Occupational Therapy Evaluation Patient Details Name: [name] MRN: [id number] DOB: 1933/09/06 Today's Date: 05/05/2011 0955 ev2 Problem List:  Patient Active Problem List  Diagnoses  . CARCINOMA, RENAL CELL  . HYPERLIPIDEMIA  . OTHER AND UNSPECIFIED COAGULATION DEFECTS  . ADJUSTMENT DISORDER WITH ANXIETY  . HOCM / IHSS  . CARDIOMYOPATHY, SECONDARY  . ATRIAL FIBRILLATION  . SINUS BRADYCARDIA  . CAROTID ARTERY STENOSIS  . HYPOTENSION  . OTHER ACUTE SINUSITIS  . COPD  . RECTAL BLEEDING  . BLOOD IN STOOL  . RENAL INSUFFICIENCY, CHRONIC  . FATIGUE  . EDEMA  . CHEST PAIN-UNSPECIFIED  . DYSURIA  . INCONTINENCE, URGE  . ICD - IN SITU  . Cough  . Avascular necrosis of hip    Past Medical History:  Past Medical History  Diagnosis Date  . Carotid artery stenosis 09/2007    60-79% bilateral (stable)  . Carotid artery stenosis 10/2008    40-59% R 60-79%   . Chest pain, unspecified   . Automatic implantable cardiac defibrillator in situ   . Atrial fibrillation   . Sinus bradycardia   . Hypotension, unspecified     cardiac cath 2006..nonobstructive CAD 30-40s lesions.Marland KitchenETT 1/09 nondiagnostic due to poor HR response..Right Renal Cancer 2003  . History of hysterectomy 1975    FOR BENIGN CAUSES  . Adjustment disorder with anxiety   . Blood in stool   . Secondary cardiomyopathy, unspecified   . Chronic airway obstruction, not elsewhere classified   . Encounter for long-term (current) use of anticoagulants   . Dysuria   . Edema   . Encounter for therapeutic drug monitoring   . Other malaise and fatigue   . Hypertrophic obstructive cardiomyopathy   . Other and unspecified hyperlipidemia   . Urge incontinence   . Other acute sinusitis   . Other and unspecified coagulation defects   . Other screening mammogram   . Pre-operative cardiovascular examination   . Hemorrhage of rectum and anus   . Chronic kidney disease, unspecified   . Special screening for osteoporosis     . Renal cancer 05-01-11    2'03-Right  . Malignant neoplasm of kidney, except pelvis   . Shortness of breath 05-01-11    exertional SOB only  . Arthritis 05-01-11    Right hip osteoarthritis   Past Surgical History:  Past Surgical History  Procedure Date  . Abdominal hysterectomy 1975    for benign causes  . Cardiac catheterization 2006  . Nephrectomy     2'03-RIGHT S/P RENAL CELL CANCER  . Cataract extraction 05-01-11    bil. with lens implant  . Insert / replace / remove pacemaker 05-01-11    02-28-05-/05-04-10-ICD-MEDTRONIC MAXIMAL DR  . Eye surgery 05-01-11    bil. cataract only  . Appendectomy     OT Assessment/Plan/Recommendation OT Assessment Clinical Impression Statement: This 77 year old female underwent R anterior approach THA.  She presents with decreased bp which is limiting activity tolerance and pain which limits ADLs  She is appropriate for skilled OT with min A goals in acute OT Recommendation/Assessment: Patient will need skilled OT in the acute care venue OT Problem List: Decreased strength;Decreased activity tolerance;Decreased knowledge of use of DME or AE;Cardiopulmonary status limiting activity;Pain Barriers to Discharge: Decreased caregiver support OT Therapy Diagnosis : Generalized weakness OT Plan OT Frequency: Min 1X/week OT Treatment/Interventions: Self-care/ADL training;Therapeutic activities;DME and/or AE instruction;Patient/family education OT Recommendation Follow Up Recommendations: Skilled nursing facility Equipment Recommended: Defer to next venue Individuals Consulted Consulted and  Agree with Results and Recommendations: Patient OT Goals Acute Rehab OT Goals OT Goal Formulation: With patient Time For Goal Achievement: 7 days ADL Goals Pt Will Perform Grooming: with supervision;Standing at sink ADL Goal: Grooming - Progress: Not met Pt Will Perform Lower Body Bathing: with min assist;Sit to stand from chair;with adaptive  equipment ADL Goal: Lower Body Bathing - Progress: Not met Pt Will Perform Lower Body Dressing: with min assist;Sit to stand from chair;with adaptive equipment ADL Goal: Lower Body Dressing - Progress: Not met Pt Will Transfer to Toilet: with min assist;3-in-1;Stand pivot transfer ADL Goal: Toilet Transfer - Progress: Not met Pt Will Perform Toileting - Clothing Manipulation: with supervision;Standing ADL Goal: Toileting - Clothing Manipulation - Progress: Not met Pt Will Perform Toileting - Hygiene: with supervision;Standing at 3-in-1/toilet ADL Goal: Toileting - Hygiene - Progress: Not met  OT Evaluation Precautions/Restrictions  Restrictions Weight Bearing Restrictions: Yes RLE Weight Bearing: Weight bearing as tolerated Prior Functioning Home Living Bathroom Shower/Tub: Chiropodist: Standard   ADL ADL Grooming: Performed;Set up Where Assessed - Grooming: Sitting, chair;Supported Artist Bathing: Performed;Set up Where Assessed - Upper Body Bathing: Sitting, chair;Supported Lower Body Bathing: Performed;Maximal assistance Where Assessed - Lower Body Bathing: Sit to stand from chair Upper Body Dressing: Performed;Minimal assistance;Other (comment) (iv) Where Assessed - Upper Body Dressing: Sitting, chair;Supported Lower Body Dressing: Simulated;+1 Total assistance Where Assessed - Lower Body Dressing: Sit to stand from chair Toileting - Clothing Manipulation: Simulated;Moderate assistance Where Assessed - Toileting Clothing Manipulation: Sit to stand from 3-in-1 or toilet Toileting - Hygiene: Performed;Minimal assistance Where Assessed - Toileting Hygiene: Standing Equipment Used: Rolling walker ADL Comments: cotx with PT.  Pt with low BP; decreased standing tolerance Vision/Perception  Vision - History Patient Visual Report: No change from baseline Cognition Cognition Arousal/Alertness: Lethargic (needs stimulation to stay awake, engaged in  task) Overall Cognitive Status: Appears within functional limits for tasks assessed Orientation Level: Oriented X4 Cognition - Other Comments: pt reports feeling like she will pass out, needs extra time anda constant cueing Sensation/Coordination   Extremity Assessment RUE Assessment RUE Assessment: Within Functional Limits LUE Assessment LUE Assessment: Within Functional Limits Mobility  Bed Mobility Sitting - Scoot to Edge of Bed Details (indicate cue type and reason): pt up in  chair per nursing Transfers Sit to Stand: 1: +2 Total assist;Patient percentage (comment) (50% then mod A x1 for second stand) Sit to Stand Details (indicate cue type and reason): pt improved with transfers during course of treatment and was able to push up and stand with min assist on second attempt with better trunk extension Stand to Sit: 3: Mod assist Stand to Sit Details: to control descent Exercises   End of Session OT - End of Session Activity Tolerance: Other (comment) (decreased BP) Patient left: in chair;with call bell in reach Nurse Communication: Other (comment) (bp) General Behavior During Session: [facility] for tasks performed Cognition: [facility] for tasks performed   Aralynn Brake [phone] 05/05/2011, 11:33 AM

## 2011-05-05 NOTE — Progress Notes (Signed)
Physical Therapy Treatment Patient Details Name: [name] MRN: [id number] DOB: 04/03/1934 Today's Date: 05/05/2011 09:25-09:45  PT Assessment/Plan Pt continues with some orthostatic hypotension, but improves somewhat with stimulation and exercise.  Anticipate she continue progress slowly and will benefit from SNF at D/C PT Goals  Acute Rehab PT Goals PT Goal: Sit to Stand - Progress: Not met PT Goal: Ambulate - Progress: Not met PT Goal: Perform Home Exercise Program - Progress: Not met  PT Treatment Precautions/Restrictions  Restrictions Weight Bearing Restrictions: Yes RLE Weight Bearing: Weight bearing as tolerated Mobility (including Balance) Bed Mobility Sitting - Scoot to Edge of Bed Details (indicate cue type and reason): pt up in  chair per nursing Transfers Sit to Stand: 1: +2 Total assist (pt = 50 %) Sit to Stand Details (indicate cue type and reason): pt improved with transfers during course of treatment and was able to push up and stand with min assist on second attempt with better trunk extension Stand to Sit: 3: Mod assist Stand to Sit Details: to control descent Ambulation/Gait Ambulation/Gait Assistance: 3: Mod assist;4: Min assist Ambulation/Gait Assistance Details (indicate cue type and reason): worked on  standing exercises with right hip flexion, ankle dorsiflexion and practice with stepping along with activation of core stabalizers as Education officer, museum Mobility: No  Posture/Postural Control Posture/Postural Control:  (continues with generalized weakness limiting posture) Exercise    End of Session PT - End of Session Equipment Utilized During Treatment:  (RW) Patient left: in chair Nurse Communication: Mobility status for transfers General Behavior During Session: Flat affect (needs encouragement.  does better with stimulation) Cognition: WFL for tasks performed  [name] 05/05/2011, 9:58 AM

## 2011-05-05 NOTE — Progress Notes (Signed)
   CARE MANAGEMENT NOTE 05/05/2011  Patient:  [name], [name]   Account Number:  [id number]  Date Initiated:  05/05/2011  Documentation initiated by:  Llana Aliment  Subjective/Objective Assessment:   Pt. admitted for re-do of right hip surgery.     Action/Plan:   discharge planning   Anticipated DC Date:  05/08/2011   Anticipated DC Plan:  SKILLED NURSING FACILITY  In-house referral  Clinical Social Worker      DC Planning Services  CM consult      Choice offered to / List presented to:             Status of service:  In process, will continue to follow Medicare Important Message given?  NO (If response is "NO", the following Medicare IM given date fields will be blank) Date Medicare IM given:   Date Additional Medicare IM given:    Discharge Disposition:    Per UR Regulation:    Comments:  05/05/11 1349--CM received referral.  Noted PT has recommended SNF and CSW has been consulted.  Pt. agrees to this plan.  CM will continue to follow for any further needs. Llana Aliment, RN, BSN Case Manager [phone]

## 2011-05-05 NOTE — Progress Notes (Addendum)
Subjective: 2 Days Post-Op Procedure(s) (LRB): TOTAL HIP ARTHROPLASTY ANTERIOR APPROACH (Right) I feel a little woozy. Dry mouth, nausea yesterday. Better post morphine d/c now with fentanyl patch and ultram. Was planning on short term SNF stay post hospital. Will plan on arranging SNF post hospital. Patient reports pain as moderate.    Objective:   VITALS:  Temp:  [98.3 F (36.8 C)-99 F (37.2 C)] 99 F (37.2 C) (12/29 1830) Pulse Rate:  [60-62] 60  (12/30 0745) Resp:  [19] 19  (12/29 1830) BP: (86-126)/(49-74) 123/74 mmHg (12/30 0745) SpO2:  [92 %-100 %] 97 % (12/30 0745)  Neurologically intact ABD soft Neurovascular intact Sensation intact distally Intact pulses distally Incision: dressing C/D/I No eye complaints today. Eyes are clear no injection or drainage Not voiding in significant amounts. No Lab  Cr/BUN, will check. Dressing changed incision is dry with erythrema, mild swelling.   LABS  Basename 05/05/11 0500 05/04/11 0540  HGB 8.8* 8.8*  WBC 8.3 6.4  PLT 161 163    Basename 05/04/11 0540  NA 140  K 4.1  CL 102  CO2 31  BUN 14  CREATININE 1.21*  GLUCOSE 127*    Basename 05/05/11 0500 05/04/11 0540  LABPT -- --  INR 2.39* 1.23     Assessment/Plan: 2 Days Post-Op Procedure(s) (LRB): TOTAL HIP ARTHROPLASTY ANTERIOR APPROACH (Right) Nausea and vomiting due to narcotics yesterday likely hypovolemia. H/H stable. Eye discomfort improved. CXR nl.  Sats improved with IS and pulm toilet.  Plan: Advance diet Up with therapy Discharge to SNF Begin d/c planning. Continue IVF to hydrate, check BMET today. Encourage po intake.  [name] E 05/05/2011, 8:41 AM

## 2011-05-05 NOTE — Progress Notes (Signed)
ANTICOAGULATION CONSULT NOTE - Follow Up Consult  Pharmacy Consult for coumadin Indication: VTE prophylaxis status post THA  Allergies  Allergen Reactions  . Codeine     REACTION: N \\T \ V  . Sulfonamide Derivatives     REACTION: Dry mouth    Patient Measurements: Height: 5' 7.5" (171.5 cm) Weight: 179 lb 8 oz (81.421 kg) IBW/kg (Calculated) : 62.75   Vital Signs: BP: 123/74 mmHg (12/30 0745) Pulse Rate: 60  (12/30 0745)  Labs:  Basename 05/05/11 0500 05/04/11 0540 05/03/11 1150  HGB 8.8* 8.8* --  HCT 27.2* 26.4* --  PLT 161 163 --  APTT -- -- --  LABPROT 26.5* 15.8* 14.7  INR 2.39* 1.23 1.13  HEPARINUNFRC -- -- --  CREATININE -- 1.21* --  CKTOTAL -- -- --  CKMB -- -- --  TROPONINI -- -- --   Estimated Creatinine Clearance: 44.5 ml/min (by C-G formula based on Cr of 1.21).   Medications:  Scheduled:    . aspirin EC  81 mg Oral QPC breakfast  . atorvastatin  40 mg Oral q1800  . carvedilol  12.5 mg Oral BID WC  . coumadin book   Does not apply Once  . disopyramide  150 mg Oral BID  . docusate sodium  100 mg Oral BID  . fentaNYL  12.5 mcg Transdermal Q72H  . ferrous sulfate  325 mg Oral TID PC  . hydrochlorothiazide  25 mg Oral QPC breakfast  . loratadine  10 mg Oral Daily  . potassium chloride SA  20 mEq Oral Daily  . verapamil  180 mg Oral QHS  . warfarin  5 mg Oral ONCE-1800  . warfarin   Does not apply Once  . DISCONTD: morphine   Intravenous Q4H   Infusions:    . dextrose 5 % and 0.45 % NaCl with KCl 10 mEq/L    . DISCONTD: sodium chloride 75 mL/hr at 05/04/11 2221   PRN: acetaminophen, acetaminophen, alum & mag hydroxide-simeth, diphenhydrAMINE, menthol-cetylpyridinium, methocarbamol(ROBAXIN) IV, methocarbamol, metoCLOPramide (REGLAN) injection, metoCLOPramide, ondansetron (ZOFRAN) IV, ondansetron, phenol, traMADol, zolpidem, DISCONTD: diphenhydrAMINE, DISCONTD: diphenhydrAMINE, DISCONTD: HYDROcodone-acetaminophen, DISCONTD: morphine, DISCONTD:  naloxone, DISCONTD: ondansetron (ZOFRAN) IV, DISCONTD: oxyCODONE DISCONTD: sodium chloride  Assessment: 77 yo F s/p R THA with hx A.fib on coumadin PTA (held for surgery). INR at goal and significant increase from yesterday (2.39  < 1.23) after two doses of 5 mg, which is higher than home dose.  Home dose dose = 2.5 mg daily Post op Hgb stable No bleeding/complications reported Goal of Therapy:  INR 2-3   Plan:  Coumadin 2.5 mg po x 1 at 1800 Follow up am INR  Sherina Stammer, Gaye Alken PharmD 9:01 AM 05/05/2011

## 2011-05-05 NOTE — Progress Notes (Signed)
Dr. Louanne Skye paged for notification of lab results. MD returned page promptly. No new orders received at this time. Will continue to monitor.

## 2011-05-05 NOTE — Progress Notes (Signed)
Clinical Social Work received referral that patient will need and benefit from SNF at dc.  Patient agreeable and full assessment and placement not placed in shadow chart.  Faxed patient out and completed FL2.  MD please sign FL2 in chart.  First choice for patient is [facility]  Will follow up and continue to help with dc planning.  Caleen Essex, MSW LCSW [phone]  Weekend Coverage

## 2011-05-06 ENCOUNTER — Encounter (HOSPITAL_COMMUNITY): Payer: Self-pay | Admitting: Orthopaedic Surgery

## 2011-05-06 LAB — CBC
HCT: 24.1 % — ABNORMAL LOW (ref 36.0–46.0)
Hemoglobin: 8.1 g/dL — ABNORMAL LOW (ref 12.0–15.0)
MCH: 29.8 pg (ref 26.0–34.0)
MCHC: 33.6 g/dL (ref 30.0–36.0)
MCV: 88.6 fL (ref 78.0–100.0)
Platelets: 159 10*3/uL (ref 150–400)
RBC: 2.72 MIL/uL — ABNORMAL LOW (ref 3.87–5.11)
RDW: 14.6 % (ref 11.5–15.5)
WBC: 7.6 10*3/uL (ref 4.0–10.5)

## 2011-05-06 LAB — BASIC METABOLIC PANEL
BUN: 28 mg/dL — ABNORMAL HIGH (ref 6–23)
CO2: 25 mEq/L (ref 19–32)
Calcium: 8.2 mg/dL — ABNORMAL LOW (ref 8.4–10.5)
Chloride: 97 mEq/L (ref 96–112)
Creatinine, Ser: 1.78 mg/dL — ABNORMAL HIGH (ref 0.50–1.10)
GFR calc Af Amer: 31 mL/min — ABNORMAL LOW (ref >90)
GFR calc non Af Amer: 27 mL/min — ABNORMAL LOW (ref >90)
Glucose, Bld: 118 mg/dL — ABNORMAL HIGH (ref 70–99)
Potassium: 3.6 mEq/L (ref 3.5–5.1)
Sodium: 130 mEq/L — ABNORMAL LOW (ref 135–145)

## 2011-05-06 LAB — PREPARE RBC (CROSSMATCH)

## 2011-05-06 LAB — PROTIME-INR
INR: 4.04 — ABNORMAL HIGH (ref 0.00–1.49)
Prothrombin Time: 39.9 seconds — ABNORMAL HIGH (ref 11.6–15.2)

## 2011-05-06 MED ORDER — FUROSEMIDE 10 MG/ML IJ SOLN
20.0000 mg | Freq: Once | INTRAMUSCULAR | Status: AC
  Administered 2011-05-06: 20 mg via INTRAVENOUS
  Filled 2011-05-06: qty 2

## 2011-05-06 MED ORDER — MAGIC MOUTHWASH
5.0000 mL | Freq: Four times a day (QID) | ORAL | Status: DC
  Administered 2011-05-06 – 2011-05-08 (×9): 5 mL via ORAL
  Filled 2011-05-06 (×14): qty 5

## 2011-05-06 MED ORDER — MAGIC MOUTHWASH
1.0000 mL | Freq: Once | ORAL | Status: AC
  Administered 2011-05-06: 1 mL via ORAL
  Filled 2011-05-06: qty 5

## 2011-05-06 MED ORDER — FLUCONAZOLE 150 MG PO TABS
150.0000 mg | ORAL_TABLET | Freq: Once | ORAL | Status: AC
  Administered 2011-05-06: 150 mg via ORAL
  Filled 2011-05-06: qty 1

## 2011-05-06 NOTE — Progress Notes (Signed)
PT Cancellation Note Pt refustd PT at this time.  She does not feel well. To receive blood today.  Will try back later or tomorrow after blood transfusion [name]

## 2011-05-06 NOTE — Progress Notes (Signed)
ANTICOAGULATION CONSULT NOTE - Follow Up Consult  Pharmacy Consult for coumadin Indication: VTE prophylaxis status post THA  Labs:  Basename 05/06/11 0530 05/05/11 0939 05/05/11 0500 05/04/11 0540  HGB 8.1* -- 8.8* --  HCT 24.1* -- 27.2* 26.4*  PLT 159 -- 161 163  APTT -- -- -- --  LABPROT 39.9* -- 26.5* 15.8*  INR 4.04* -- 2.39* 1.23  HEPARINUNFRC -- -- -- --  CREATININE 1.78* 1.73* -- 1.21*  CKTOTAL -- -- -- --  CKMB -- -- -- --  TROPONINI -- -- -- --   Estimated Creatinine Clearance: 30.3 ml/min (by C-G formula based on Cr of 1.78).   Medications:  Scheduled:     . aspirin EC  81 mg Oral QPC breakfast  . atorvastatin  40 mg Oral q1800  . carvedilol  12.5 mg Oral BID WC  . disopyramide  150 mg Oral BID  . docusate sodium  100 mg Oral BID  . fentaNYL  12.5 mcg Transdermal Q72H  . ferrous sulfate  325 mg Oral TID PC  . fluconazole  150 mg Oral Once  . hydrochlorothiazide  25 mg Oral QPC breakfast  . loratadine  10 mg Oral Daily  . magic mouthwash  5 mL Oral QID  . magic mouthwash  1 mL Oral Once  . potassium chloride SA  20 mEq Oral Daily  . verapamil  180 mg Oral QHS  . warfarin  2.5 mg Oral ONCE-1800   Infusions:     . dextrose 5 % and 0.45 % NaCl with KCl 10 mEq/L 100 mL/hr at 05/06/11 0538   PRN: acetaminophen, acetaminophen, alum & mag hydroxide-simeth, diphenhydrAMINE, menthol-cetylpyridinium, methocarbamol(ROBAXIN) IV, methocarbamol, metoCLOPramide (REGLAN) injection, metoCLOPramide, ondansetron (ZOFRAN) IV, ondansetron, phenol, traMADol, zolpidem  Assessment:  77 yo F s/p R THA with hx A.fib on coumadin PTA - restarted 12/28 after being held for surgery.  Home dose dose = 2.5 mg daily  INR supratherapeutic and responded quickly to 5mg  boost doses x 2 following surgery.  Only drug-drug interaction of note is fluconazole dose x 1 yesterday, but low PO intake also may have contributed to high INR.  Hgb low following surgery - planning for  transfusion.  Goal of Therapy:  INR 2-3   Plan:  No coumadin today. Follow up am INR and CBC.  Hershal Coria PharmD 10:53 AM 05/06/2011

## 2011-05-06 NOTE — Progress Notes (Signed)
Subjective: 3 Days Post-Op Procedure(s) (LRB): TOTAL HIP ARTHROPLASTY ANTERIOR APPROACH (Right) Patient reports pain as moderate.    Objective: Vital signs in last 24 hours: Temp:  [97.9 F (36.6 C)-98.9 F (37.2 C)] 98.1 F (36.7 C) (12/31 0503) Pulse Rate:  [60-65] 61  (12/31 0503) Resp:  [18] 18  (12/31 0503) BP: (93-150)/(56-74) 150/74 mmHg (12/31 0503) SpO2:  [90 %-98 %] 90 % (12/31 0503)  Intake/Output from previous day: 12/30 0701 - 12/31 0700 In: 120 [P.O.:120] Out: 1450 [Urine:1450] Intake/Output this shift:     Basename 05/06/11 0530 05/05/11 0500 05/04/11 0540  HGB 8.1* 8.8* 8.8*    Basename 05/06/11 0530 05/05/11 0500  WBC 7.6 8.3  RBC 2.72* 3.06*  HCT 24.1* 27.2*  PLT 159 161    Basename 05/06/11 0530 05/05/11 0939  NA 130* 130*  K 3.6 4.0  CL 97 94*  CO2 25 25  BUN 28* 24*  CREATININE 1.78* 1.73*  GLUCOSE 118* 130*  CALCIUM 8.2* 8.1*    Basename 05/06/11 0530 05/05/11 0500  LABPT -- --  INR 4.04* 2.39*    Sensation intact distally Intact pulses distally Dorsiflexion/Plantar flexion intact Incision: scant drainage  Assessment/Plan: 3 Days Post-Op Procedure(s) (LRB): TOTAL HIP ARTHROPLASTY ANTERIOR APPROACH (Right) Up with therapy Needs PRBC's/transfusion due to Hgb down to 8.1 (acute blood loss anemia post-op) combined with increasing creatinine. Social work consult for SNF placement short-term for later this week.  Eulan Heyward Y 05/06/2011, 7:11 AM

## 2011-05-07 LAB — BASIC METABOLIC PANEL
BUN: 34 mg/dL — ABNORMAL HIGH (ref 6–23)
CO2: 26 mEq/L (ref 19–32)
Calcium: 8.5 mg/dL (ref 8.4–10.5)
Chloride: 97 mEq/L (ref 96–112)
Creatinine, Ser: 1.9 mg/dL — ABNORMAL HIGH (ref 0.50–1.10)
GFR calc Af Amer: 29 mL/min — ABNORMAL LOW (ref >90)
GFR calc non Af Amer: 25 mL/min — ABNORMAL LOW (ref >90)
Glucose, Bld: 99 mg/dL (ref 70–99)
Potassium: 3.8 mEq/L (ref 3.5–5.1)
Sodium: 131 mEq/L — ABNORMAL LOW (ref 135–145)

## 2011-05-07 LAB — CBC
HCT: 30.8 % — ABNORMAL LOW (ref 36.0–46.0)
Hemoglobin: 10.6 g/dL — ABNORMAL LOW (ref 12.0–15.0)
MCH: 29.7 pg (ref 26.0–34.0)
MCHC: 34.4 g/dL (ref 30.0–36.0)
MCV: 86.3 fL (ref 78.0–100.0)
Platelets: 169 10*3/uL (ref 150–400)
RBC: 3.57 MIL/uL — ABNORMAL LOW (ref 3.87–5.11)
RDW: 14.9 % (ref 11.5–15.5)
WBC: 7.4 10*3/uL (ref 4.0–10.5)

## 2011-05-07 LAB — TYPE AND SCREEN
ABO/RH(D): A NEG
Antibody Screen: NEGATIVE
Unit division: 0
Unit division: 0

## 2011-05-07 LAB — PROTIME-INR
INR: 5.22 (ref 0.00–1.49)
Prothrombin Time: 48.8 seconds — ABNORMAL HIGH (ref 11.6–15.2)

## 2011-05-07 NOTE — Progress Notes (Signed)
Pt INR 5.22 MD notified no new orders at this time will continue to monitor.

## 2011-05-07 NOTE — Progress Notes (Signed)
ANTICOAGULATION CONSULT NOTE - Follow Up Consult  Pharmacy Consult for coumadin Indication: VTE prophylaxis status post THA  Labs:  Basename 05/07/11 0500 05/06/11 0530 05/05/11 0939 05/05/11 0500  HGB 10.6* 8.1* -- --  HCT 30.8* 24.1* -- 27.2*  PLT 169 159 -- 161  APTT -- -- -- --  LABPROT 48.8* 39.9* -- 26.5*  INR 5.22* 4.04* -- 2.39*  HEPARINUNFRC -- -- -- --  CREATININE 1.90* 1.78* 1.73* --  CKTOTAL -- -- -- --  CKMB -- -- -- --  TROPONINI -- -- -- --   Estimated Creatinine Clearance: 28.4 ml/min (by C-G formula based on Cr of 1.9).   Medications:  Scheduled:     . aspirin EC  81 mg Oral QPC breakfast  . atorvastatin  40 mg Oral q1800  . carvedilol  12.5 mg Oral BID WC  . disopyramide  150 mg Oral BID  . docusate sodium  100 mg Oral BID  . furosemide  20 mg Intravenous Once  . hydrochlorothiazide  25 mg Oral QPC breakfast  . loratadine  10 mg Oral Daily  . magic mouthwash  5 mL Oral QID  . potassium chloride SA  20 mEq Oral Daily  . verapamil  180 mg Oral QHS  . DISCONTD: fentaNYL  12.5 mcg Transdermal Q72H  . DISCONTD: ferrous sulfate  325 mg Oral TID PC    PRN: acetaminophen, acetaminophen, alum & mag hydroxide-simeth, diphenhydrAMINE, menthol-cetylpyridinium, methocarbamol(ROBAXIN) IV, methocarbamol, metoCLOPramide (REGLAN) injection, metoCLOPramide, ondansetron (ZOFRAN) IV, ondansetron, phenol, traMADol, zolpidem  Assessment:  78 yo F s/p R THA with hx A.fib on coumadin PTA - restarted 12/28 after being held for surgery.  Home dose dose = 2.5 mg daily  INR continues supratherapeutic. Only drug-drug interaction of note is fluconazole dose x 1 12/30, but low PO intake also may have contributed to high INR.  H/H improved post transfusion.  Goal of Therapy:  INR 2-3   Plan:  No coumadin today. Follow up am INR and CBC.  Minda Ditto PharmD Pager [phone] 12:08 PM 05/07/2011

## 2011-05-07 NOTE — Progress Notes (Signed)
CRITICAL VALUE ALERT  Critical value received: 5.22  Date of notification:  05/07/11  Time of notification:  0620  Critical value read back:yes  Nurse who received alert: Roslyn Smiling  MD notified (1st page): Marcellus Scott  Time of first page:  0630  MD notified (2nd page):  Time of second page:  Responding MD: Marcellus Scott  Time MD responded: [phone]

## 2011-05-07 NOTE — Progress Notes (Signed)
Subjective: 4 Days Post-Op Procedure(s) (LRB): TOTAL HIP ARTHROPLASTY ANTERIOR APPROACH (Right) Patient reports pain as mild.  Feels a little better.  States that she is making urine.  Transfusion yesterday.  Objective: Vital signs in last 24 hours: Temp:  [97.5 F (36.4 C)-99 F (37.2 C)] 99 F (37.2 C) (01/01 0512) Pulse Rate:  [59-65] 60  (01/01 0512) Resp:  [14-20] 18  (01/01 0512) BP: (91-140)/(60-83) 120/72 mmHg (01/01 0512) SpO2:  [93 %-98 %] 93 % (01/01 0512)  Intake/Output from previous day: 12/31 0701 - 01/01 0700 In: 2855.6 [P.O.:360; I.V.:1800; Blood:689.6; IV Piggyback:6] Out: 350 [Urine:350] Intake/Output this shift:     Basename 05/07/11 0500 05/06/11 0530 05/05/11 0500  HGB 10.6* 8.1* 8.8*    Basename 05/07/11 0500 05/06/11 0530  WBC 7.4 7.6  RBC 3.57* 2.72*  HCT 30.8* 24.1*  PLT 169 159    Basename 05/07/11 0500 05/06/11 0530  NA 131* 130*  K 3.8 3.6  CL 97 97  CO2 26 25  BUN 34* 28*  CREATININE 1.90* 1.78*  GLUCOSE 99 118*  CALCIUM 8.5 8.2*    Basename 05/07/11 0500 05/06/11 0530  LABPT -- --  INR 5.22* 4.04*    Sensation intact distally Intact pulses distally Dorsiflexion/Plantar flexion intact Incision: scant drainage  Assessment/Plan: 4 Days Post-Op Procedure(s) (LRB): TOTAL HIP ARTHROPLASTY ANTERIOR APPROACH (Right) Up with therapy Check CBC and BMET in am to continue to monitor kidney function. If labs look better, could d/c/ to ST-SNF tomorrow. Hold coumadin Krystall Kruckenberg Y 05/07/2011, 9:01 AM
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
## 2011-05-07 NOTE — Progress Notes (Signed)
Physical Therapy Treatment Patient Details Name: [name] MRN: [id number] DOB: 03-08-1934 Today's Date: 05/07/2011 Time: [id number] Charge: TA, TE PT Assessment/Plan  PT - Assessment/Plan Comments on Treatment Session: Pt up in recliner on arrival and assist to [facility] then supine position in bed and performed exercises.  Deferred further mobility 2* to increased INR. PT Plan: Discharge plan remains appropriate Follow Up Recommendations: Skilled nursing facility Equipment Recommended: Defer to next venue PT Goals  Acute Rehab PT Goals Pt will go Sit to Stand: with modified independence PT Goal: Sit to Stand - Progress: Updated due to goal met PT Goal: Perform Home Exercise Program - Progress: Progressing toward goal  PT Treatment Precautions/Restrictions  Restrictions Weight Bearing Restrictions: Yes RLE Weight Bearing: Weight bearing as tolerated Mobility (including Balance) Bed Mobility Bed Mobility: Yes Sit to Supine - Left: 4: Min assist Sit to Supine - Left Details (indicate cue type and reason): assist to bring R LE onto bed Transfers Transfers: Yes Sit to Stand: 4: Min assist;With upper extremity assist;From chair/3-in-1 Sit to Stand Details (indicate cue type and reason): pt demonstrates safe technique but assist required for weakness Stand to Sit: 4: Min assist;With upper extremity assist;To chair/3-in-1;To bed Stand to Sit Details: min/guard Stand Pivot Transfers: 4: Min Psychologist, occupational Details (indicate cue type and reason): min/guard, with RW, pt able to take a few steps to [facility] then stand pivot to bed, decreased hip and knee flexion observed Ambulation/Gait Ambulation/Gait: No (deferred 2* to high INR)    Exercise  Total Joint Exercises Ankle Circles/Pumps: AROM;Both;20 reps;Supine Quad Sets: Supine;20 reps;Both;Strengthening Gluteal Sets: Strengthening;Both;Other reps (comment);Supine (20 reps) Short Arc Quad: AROM;Strengthening;Right;Other reps  (comment);Supine (20 reps) Heel Slides: AROM;Strengthening;Right;15 reps;Supine End of Session PT - End of Session Equipment Utilized During Treatment: Gait belt Activity Tolerance: Patient tolerated treatment well Patient left: in bed;with call bell in reach General Behavior During Session: [facility] for tasks performed Cognition: [facility] for tasks performed  Marchell Froman,KATHrine E 05/07/2011, 3:14 PM Pager: [phone]

## 2011-05-08 LAB — BASIC METABOLIC PANEL
BUN: 33 mg/dL — ABNORMAL HIGH (ref 6–23)
CO2: 27 mEq/L (ref 19–32)
Calcium: 8.6 mg/dL (ref 8.4–10.5)
Chloride: 99 mEq/L (ref 96–112)
Creatinine, Ser: 1.68 mg/dL — ABNORMAL HIGH (ref 0.50–1.10)
GFR calc Af Amer: 33 mL/min — ABNORMAL LOW (ref >90)
GFR calc non Af Amer: 29 mL/min — ABNORMAL LOW (ref >90)
Glucose, Bld: 81 mg/dL (ref 70–99)
Potassium: 4.1 mEq/L (ref 3.5–5.1)
Sodium: 133 mEq/L — ABNORMAL LOW (ref 135–145)

## 2011-05-08 LAB — CBC
HCT: 29.4 % — ABNORMAL LOW (ref 36.0–46.0)
Hemoglobin: 9.9 g/dL — ABNORMAL LOW (ref 12.0–15.0)
MCH: 29.4 pg (ref 26.0–34.0)
MCHC: 33.7 g/dL (ref 30.0–36.0)
MCV: 87.2 fL (ref 78.0–100.0)
Platelets: 175 10*3/uL (ref 150–400)
RBC: 3.37 MIL/uL — ABNORMAL LOW (ref 3.87–5.11)
RDW: 14.9 % (ref 11.5–15.5)
WBC: 5 10*3/uL (ref 4.0–10.5)

## 2011-05-08 LAB — PROTIME-INR
INR: 3.78 — ABNORMAL HIGH (ref 0.00–1.49)
Prothrombin Time: 37.9 seconds — ABNORMAL HIGH (ref 11.6–15.2)

## 2011-05-08 MED ORDER — METHOCARBAMOL 500 MG PO TABS: 500.0000 mg | ORAL_TABLET | Freq: Four times a day (QID) | ORAL | Status: AC | PRN

## 2011-05-08 MED ORDER — HYDROCODONE-ACETAMINOPHEN 5-325 MG PO TABS: 1.0000 | ORAL_TABLET | ORAL | Status: AC | PRN

## 2011-05-08 NOTE — Progress Notes (Signed)
ANTICOAGULATION CONSULT NOTE - Follow Up Consult  Pharmacy Consult for Warfarin Indication: VTE prophylaxis status post THA with Hx Afib   Allergies  Allergen Reactions  . Codeine     REACTION: N \\T \ V  . Sulfonamide Derivatives     REACTION: Dry mouth    Patient Measurements: Height: 5' 7.5" (171.5 cm) Weight: 179 lb 8 oz (81.421 kg) IBW/kg (Calculated) : 62.75   Vital Signs: Temp: 98.2 F (36.8 C) (01/02 0546) Temp src: Oral (01/02 0546) BP: 128/78 mmHg (01/02 0546) Pulse Rate: 49  (01/02 0546)  Labs:  Basename 05/08/11 0450 05/07/11 0500 05/06/11 0530  HGB 9.9* 10.6* --  HCT 29.4* 30.8* 24.1*  PLT 175 169 159  APTT -- -- --  LABPROT 37.9* 48.8* 39.9*  INR 3.78* 5.22* 4.04*  HEPARINUNFRC -- -- --  CREATININE 1.68* 1.90* 1.78*  CKTOTAL -- -- --  CKMB -- -- --  TROPONINI -- -- --   Estimated Creatinine Clearance: 32.1 ml/min (by C-G formula based on Cr of 1.68).   Medications:  Scheduled:    . aspirin EC  81 mg Oral QPC breakfast  . atorvastatin  40 mg Oral q1800  . carvedilol  12.5 mg Oral BID WC  . disopyramide  150 mg Oral BID  . docusate sodium  100 mg Oral BID  . hydrochlorothiazide  25 mg Oral QPC breakfast  . loratadine  10 mg Oral Daily  . magic mouthwash  5 mL Oral QID  . potassium chloride SA  20 mEq Oral Daily  . verapamil  180 mg Oral QHS   Infusions:    . dextrose 5 % and 0.45 % NaCl with KCl 10 mEq/L 100 mL/hr at 05/07/11 0827   PRN: acetaminophen, acetaminophen, alum & mag hydroxide-simeth, diphenhydrAMINE, menthol-cetylpyridinium, methocarbamol(ROBAXIN) IV, methocarbamol, metoCLOPramide (REGLAN) injection, metoCLOPramide, ondansetron (ZOFRAN) IV, ondansetron, phenol, traMADol, zolpidem  Assessment: 78 yo F s/p THA with hx of Afib on chronic warfarin, restarted 12/28 s/p surgery. Home dose reported as 2.5mg  PO daily. This dose was ordered to continue at discharge (per MD's dictated H&P) (to SNF).  Today's INR remains supratherapeutic.  Suggest continuing to hold warfarin until INR <3, then resuming warfarin 2.5mg  PO daily.  Goal of Therapy:  INR 2-3   Plan:  1)  No warfarin today. 2) Suggest holding warfarin until INR <3, then redosing based on INR trend (to be followed up/assessed at [facility]).  Samul Dada 05/08/2011,9:47 AM

## 2011-05-08 NOTE — Progress Notes (Signed)
Physical Therapy Treatment Patient Details Name: RUHAMA GAWRON MRN: [id number] DOB: 03-11-1934 Today's Date: 05/08/2011 8:55 - 9:20 1 gt  1ta PT Assessment/Plan  PT - Assessment/Plan Comments on Treatment Session: Pt plans to D/C to [facility] today PT Plan: Discharge plan remains appropriate Follow Up Recommendations: Skilled nursing facility Equipment Recommended: Defer to next venue PT Goals  Acute Rehab PT Goals PT Goal Formulation: With patient Pt will go Supine/Side to Sit: with min assist PT Goal: Supine/Side to Sit - Progress: Progressing toward goal Pt will go Sit to Stand: with modified independence PT Goal: Sit to Stand - Progress: Progressing toward goal Pt will Ambulate: 51 - 150 feet;with min assist;with least restrictive assistive device PT Goal: Ambulate - Progress: Progressing toward goal Pt will Perform Home Exercise Program: with supervision, verbal cues required/provided PT Goal: Perform Home Exercise Program - Progress: Progressing toward goal  PT Treatment Precautions/Restrictions  Restrictions Weight Bearing Restrictions: Yes RLE Weight Bearing: Weight bearing as tolerated Mobility (including Balance) Bed Mobility Bed Mobility: Yes Supine to Sit: 3: Mod assist Supine to Sit Details (indicate cue type and reason): HOB increased 45" Sitting - Scoot to Edge of Bed: 3: Mod assist Sitting - Scoot to Edge of Bed Details (indicate cue type and reason): Mod assist to support R LE off the bed Transfers Transfers: Yes Sit to Stand: 4: Min assist;From elevated surface Sit to Stand Details (indicate cue type and reason): increased time and 75% VC's on safety with hand placement and proper turn completion Stand to Sit: 4: Min assist;To chair/3-in-1 Stand to Sit Details: increased time and 75% VC's on hand placement prior to sit Ambulation/Gait Ambulation/Gait: Yes Ambulation/Gait Assistance: 3: Mod assist Ambulation/Gait Assistance Details (indicate cue type  and reason): increased time with min c/o hip soreness and mod c/o fatique Ambulation Distance (Feet): 70 Feet Assistive device: Rolling walker Gait Pattern: Step-to pattern Stairs: No Wheelchair Mobility Wheelchair Mobility: No    Exercise    End of Session PT - End of Session Equipment Utilized During Treatment: Gait belt Activity Tolerance: Patient tolerated treatment well Patient left: in chair;with call bell in reach General Behavior During Session: [facility] for tasks performed Cognition: [facility] for tasks performed Rica Koyanagi PTA [facility]  Acute  Rehab Pager     [phone]

## 2011-05-08 NOTE — Discharge Summary (Signed)
Physician Discharge Summary  Patient ID: [name] MRN: [id number] DOB/AGE: Jul 06, 1933 78 y.o.  Admit date: 05/03/2011 Discharge date: 05/08/2011  Admission Diagnoses:  Avascular necrosis of femur head, right  Discharge Diagnoses:  Active Problems:  Avascular necrosis of hip   Past Medical History  Diagnosis Date  . Carotid artery stenosis 09/2007    60-79% bilateral (stable)  . Carotid artery stenosis 10/2008    40-59% R 60-79%   . Chest pain, unspecified   . Automatic implantable cardiac defibrillator in situ   . [place]ath-induced atrial fibrillation   . Sinus bradycardia   . Hypotension, unspecified     cardiac cath 2006..nonobstructive CAD 30-40s lesions.Marland KitchenETT 1/09 nondiagnostic due to poor HR response..Right Renal Cancer 2003  . History of hysterectomy 1975    FOR BENIGN CAUSES  . Adjustment disorder with anxiety   . Blood in stool   . Secondary cardiomyopathy, unspecified   . Chronic airway obstruction, not elsewhere classified   . Encounter for long-term (current) use of anticoagulants   . Dysuria   . Edema   . Encounter for therapeutic drug monitoring   . Other malaise and fatigue   . Hypertrophic obstructive cardiomyopathy   . Other and unspecified hyperlipidemia   . Urge incontinence   . Other acute sinusitis   . Other and unspecified coagulation defects   . Other screening mammogram   . Pre-operative cardiovascular examination   . Hemorrhage of rectum and anus   . Chronic kidney disease, unspecified   . Special screening for osteoporosis   . Renal cancer 05-01-11    2'03-Right  . Malignant neoplasm of kidney, except pelvis   . Shortness of breath 05-01-11    exertional SOB only  . Arthritis 05-01-11    Right hip osteoarthritis    Surgeries: Procedure(s): TOTAL HIP ARTHROPLASTY ANTERIOR APPROACH on 05/03/2011   Consultants (if any):    Discharged Condition: Improved  Hospital Course: SANAAI [name] is an 78 y.o. female who was admitted  05/03/2011 with a diagnosis of Avascular necrosis of femur head, right and went to the operating room on 05/03/2011 and underwent the above named procedures.    She was given perioperative antibiotics:  Anti-infectives     Start     Dose/Rate Route Frequency Ordered Stop   05/06/11 0730   fluconazole (DIFLUCAN) tablet 150 mg        150 mg Oral  Once 05/06/11 0716 05/06/11 0857   05/03/11 1850   ceFAZolin (ANCEF) IVPB 1 g/50 mL premix        1 g 100 mL/hr over 30 Minutes Intravenous Every 6 hours 05/03/11 1723 05/04/11 0741   05/03/11 1130   ceFAZolin (ANCEF) IVPB 2 g/50 mL premix        2 g 100 mL/hr over 30 Minutes Intravenous  Once 05/03/11 1117 05/03/11 1251        .  She was given sequential compression devices, early ambulation, and chemoprophylaxis for DVT prophylaxis.  She benefited maximally from their hospital stay and there were no complications.  She was transfused 2 units PRBC's due to acute blood loss anemia and due to decreased urine output and this did improve her clinical situation.  Recent vital signs:  Filed Vitals:   05/08/11 0546  BP: 128/78  Pulse: 49  Temp: 98.2 F (36.8 C)  Resp: 18    Recent laboratory studies:  Lab Results  Component Value Date   HGB 9.9* 05/08/2011   HGB 10.6* 05/07/2011  HGB 8.1* 05/06/2011   Lab Results  Component Value Date   WBC 5.0 05/08/2011   PLT 175 05/08/2011   Lab Results  Component Value Date   INR 3.78* 05/08/2011   Lab Results  Component Value Date   NA 133* 05/08/2011   K 4.1 05/08/2011   CL 99 05/08/2011   CO2 27 05/08/2011   BUN 33* 05/08/2011   CREATININE 1.68* 05/08/2011   GLUCOSE 81 05/08/2011    Discharge Medications:   Current Discharge Medication List    START taking these medications   Details  HYDROcodone-acetaminophen (NORCO) 5-325 MG per tablet Take 1-2 tablets by mouth every 4 (four) hours as needed for pain. Qty: 60 tablet, Refills: 0    methocarbamol (ROBAXIN) 500 MG tablet Take 1 tablet (500 mg  total) by mouth 4 (four) times daily as needed. Qty: 30 tablet, Refills: 0      CONTINUE these medications which have NOT CHANGED   Details  acetaminophen (TYLENOL) 500 MG tablet Take 1,000 mg by mouth every 6 (six) hours as needed. Pain      atorvastatin (LIPITOR) 40 MG tablet Take 40 mg by mouth every evening.     carvedilol (COREG) 12.5 MG tablet Take 1 tablet (12.5 mg total) by mouth 2 (two) times daily with a meal. Qty: 60 tablet, Refills: 3    cetirizine (ZYRTEC) 10 MG tablet Take 10 mg by mouth daily.     disopyramide (NORPACE) 150 MG capsule Take 1 capsule (150 mg total) by mouth 2 (two) times daily. Qty: 60 capsule, Refills: 6    hydrochlorothiazide (HYDRODIURIL) 25 MG tablet Take 25 mg by mouth daily after breakfast.     potassium chloride SA (KLOR-CON M20) 20 MEQ tablet Take 20 mEq by mouth daily.     verapamil (CALAN-SR) 180 MG CR tablet Take 180 mg by mouth at bedtime.     aspirin 81 MG EC tablet Take 81 mg by mouth daily after breakfast.     furosemide (LASIX) 20 MG tablet Take 20 mg by mouth daily as needed. Fluid     warfarin (COUMADIN) 5 MG tablet Take 2.5 mg by mouth daily.         Diagnostic Studies: Dg Chest 2 View  05/01/2011  *RADIOLOGY REPORT*  Clinical Data: Preoperative respiratory films.  CHEST - 2 VIEW  Comparison: CT chest 06/07/2010 and plain films of the chest 06/02/2010.  Findings: AICD remains in place.  Lungs are clear.  Heart size upper normal.  No pneumothorax or effusion.  IMPRESSION: No acute disease.  Original Report Authenticated By: Arvid Right. D'ALESSIO, M.D.   Dg Hip Complete Right  05/03/2011  *RADIOLOGY REPORT*  Clinical Data: Right hip replacement.  RIGHT HIP - COMPLETE 2+ VIEW  Comparison: 02/26/2011 CT  Findings: Two spot fluoroscopic images obtained intraoperatively from an anterior projection demonstrate right hip arthroplasty hardware in expected position.  IMPRESSION: Right hip arthroplasty.  Original Report Authenticated By:  Suanne Marker, M.D.   Dg Pelvis Portable  05/03/2011  *RADIOLOGY REPORT*  Clinical Data: Hip replacement  PORTABLE PELVIS  Comparison: 10/23  Findings: Total hip replacement on the right.  No evidence of pelvic fracture or regional complication.  IMPRESSION: The appearance following total hip replacement on the right.  Original Report Authenticated By: Jules Schick, M.D.   Dg Chest Port 1 View  05/04/2011  *RADIOLOGY REPORT*  Clinical Data: Low O2 saturation.  Postop total hip surgery.  PORTABLE CHEST - 1 VIEW  Comparison: 05/01/2011  Findings: The heart size and vascularity are normal. AICD in place with two sets of leads.  Multiple calcified granulomas in the lungs. Lungs are otherwise clear.  No infiltrates or effusions.  No osseous abnormality of significance.  IMPRESSION: No acute disease in the chest.  Original Report Authenticated By: Larey Seat, M.D.   Dg Hip Portable 1 View Right  05/03/2011  *RADIOLOGY REPORT*  Clinical Data: Hip replacement  PORTABLE RIGHT HIP - 1 VIEW  Comparison: Same day  Findings: Lateral view shows good appearance of the right hip arthroplasty.  Femoral stem appears normal.  IMPRESSION: Normal appearing lateral view.  Original Report Authenticated By: Jules Schick, M.D.    Disposition: [place]  Discharge Orders    Future Appointments: Provider: Department: Dept Phone: Center:   07/11/2011 8:30 AM Elio Forget, [place] [phone] [id number]         Signed: Mcarthur Rossetti 05/08/2011, 7:32 AM

## 2011-05-09 NOTE — Progress Notes (Signed)
Discharge summary sent to payer through [facility]  

## 2011-05-18 ENCOUNTER — Telehealth: Payer: Self-pay | Admitting: Adult Health

## 2011-05-18 NOTE — Telephone Encounter (Signed)
Received phone call from home health nurse on her check in with [name]. She states she is mildly orthostatic and symptomatic with  Lightheadedness and dizziness. BP recoreded at 118/68 sitting and 110/58 standing with complaints of lightheadedness.  She is on verapamil, HCTZ,Coreg, and also lasix prn for edema. She had not taken the lasix . I have advised the nurse to have her hold the HCTZ and have BP rechecke in am. PT-INR was also checked and found to be 1.4. There was no evidence of complaints of bleeding.  If she remains symptomatic, she is to call Dr. Donivan Scull office to make appointment.

## 2011-05-20 ENCOUNTER — Encounter: Payer: Self-pay | Admitting: Cardiovascular Disease

## 2011-05-20 ENCOUNTER — Ambulatory Visit (INDEPENDENT_AMBULATORY_CARE_PROVIDER_SITE_OTHER): Payer: Medicare Other | Admitting: Cardiovascular Disease

## 2011-05-20 ENCOUNTER — Telehealth: Payer: Self-pay | Admitting: *Deleted

## 2011-05-20 DIAGNOSIS — R5381 Other malaise: Secondary | ICD-10-CM

## 2011-05-20 DIAGNOSIS — I6529 Occlusion and stenosis of unspecified carotid artery: Secondary | ICD-10-CM

## 2011-05-20 DIAGNOSIS — I10 Essential (primary) hypertension: Secondary | ICD-10-CM | POA: Insufficient documentation

## 2011-05-20 DIAGNOSIS — I429 Cardiomyopathy, unspecified: Secondary | ICD-10-CM

## 2011-05-20 DIAGNOSIS — N19 Unspecified kidney failure: Secondary | ICD-10-CM

## 2011-05-20 DIAGNOSIS — R5383 Other fatigue: Secondary | ICD-10-CM

## 2011-05-20 DIAGNOSIS — I4891 Unspecified atrial fibrillation: Secondary | ICD-10-CM

## 2011-05-20 DIAGNOSIS — E785 Hyperlipidemia, unspecified: Secondary | ICD-10-CM

## 2011-05-20 HISTORY — DX: Essential (primary) hypertension: I10

## 2011-05-20 NOTE — Patient Instructions (Signed)
You are doing well. No medication changes were made.  We will check you blood work today.   Please call us if you have new issues that need to be addressed before your next appt.  Your physician wants you to follow-up in: 1 months.  You will receive a reminder letter in the mail two months in advance. If you don't receive a letter, please call our office to schedule the follow-up appointment.

## 2011-05-20 NOTE — Telephone Encounter (Signed)
Pt had hip sx 05/03/11, had 2pts of blood afterwards at [facility]. Pt now reports incr weakness and dizziness. Her HHRN did orthostatic BPs on Saturday, and reported drop in BP; see phone note 1/12. She was advised to schedule f/u if s/s did not subside. Pt  HHRN will return today at 1130 to re-evaluate. I added pt on today at [facility] in clinic, she will bring her BP results with her.

## 2011-05-20 NOTE — Assessment & Plan Note (Signed)
60% estimated carotid stenosis on the left. We'll continue aggressive medical management

## 2011-05-20 NOTE — Assessment & Plan Note (Signed)
Goal total cholesterol less than 150, LDL less than 70. Options include changing to Crestor to 40 mg daily or adding zetia 10 mg daily. We'll defer to Dr. Deborra Medina.

## 2011-05-20 NOTE — Assessment & Plan Note (Signed)
Recent symptoms of malaise and fatigue since her right total hip replacement. Etiology is uncertain. We will suggest she have her pacemaker checked as it has been some time since this has been done, to exclude underlying arrhythmia.   We will order a CBC and basic metabolic panel. She does report having anemia as well as renal dysfunction while in the hospital.  Right lower extremity edema is likely secondary to post surgical state on the right, not likely secondary to congestive heart failure.  Uncertain if her symptoms are secondary to anesthesia effects, or reaction from morphine. We will wait for the blood work to return for further testing.  She is not orthostatic today with systolic pressures in the 150 range lying down and standing with no significant change in heart rate, maintaining in the mid 60s

## 2011-05-20 NOTE — Assessment & Plan Note (Signed)
Blood pressure is mildly elevated on today's visit. Given her general malaise, no significant medication changes were made.

## 2011-05-20 NOTE — Progress Notes (Signed)
Patient ID: [name], female    DOB: 1933-05-15, 78 y.o.   MRN: [id number]  HPI Comments: [name] is a delightful 78 year old woman with a history of hypertrophic cardiomyopathy complicated by syncope, COPD, as well as symptomatic bradycardia.  She is status post ICD.  She also has a history of nonobstructive coronary artery disease with chronic chest pain, bilateral carotid artery stenosis, paroxysmal atrial fibrillation, renal cell carcinoma status post resection of her right kidney with residual chronic renal insufficiency, and COPD.     She reports that she had right hip replacement on May 03 2011. Since that time, and since her discharge to [facility] and now home, she has had general malaise and fatigue. She is concerned as to why she is not improving. Symptoms started when she left the hospital. She did report needing 2 units of packed red blood cells for blood loss anemia. Blood pressures at home have ranged between 0000000 systolic. No recent medication changes. She does have swelling on her right lower extremity, no swelling of the left lower extremity. No significant lightheadedness when she stands up.  She has not been taking pain medication since her discharge. She reports having a reaction to morphine given in the perioperative period. She denies any significant constipation.   stress test in March 2011.  Echo EF 55-60% no LVOT gradient.  No CP or SOB.  Fatigued at times.     Last carotid u/s  6/10; 40-59% R 60-79%. No neuro symptoms.     Cardiac cath 2006    --non-obstructive CAD 30-40s lesions   --ETT 1/09 nondiagnostic due to poor HR response  EKG shows AV paced rhythm     Outpatient Encounter Prescriptions as of 05/20/2011  Medication Sig Dispense Refill  . aspirin 81 MG EC tablet Take 81 mg by mouth daily after breakfast.       . atorvastatin (LIPITOR) 40 MG tablet Take 40 mg by mouth every evening.       . carvedilol (COREG) 12.5 MG tablet Take 1 tablet  (12.5 mg total) by mouth 2 (two) times daily with a meal.  60 tablet  3  . cetirizine (ZYRTEC) 10 MG tablet Take 10 mg by mouth daily.       . disopyramide (NORPACE) 150 MG capsule Take 1 capsule (150 mg total) by mouth 2 (two) times daily.  60 capsule  6  . furosemide (LASIX) 20 MG tablet Take 20 mg by mouth daily as needed. Fluid       . potassium chloride SA (KLOR-CON M20) 20 MEQ tablet Take 20 mEq by mouth daily.       . verapamil (CALAN-SR) 180 MG CR tablet Take 180 mg by mouth at bedtime.       Marland Kitchen warfarin (COUMADIN) 5 MG tablet Take 2.5 mg by mouth daily.       Marland Kitchen docusate sodium (COLACE) 250 MG capsule Take 250 mg by mouth daily.      . hydrochlorothiazide (HYDRODIURIL) 25 MG tablet Take 25 mg by mouth daily after breakfast.           Review of Systems  Constitutional: Positive for fatigue.  HENT: Negative.   Eyes: Negative.   Respiratory: Negative.   Gastrointestinal: Negative.   Skin: Negative.   Neurological: Positive for weakness.  Hematological: Negative.   Psychiatric/Behavioral: Negative.   All other systems reviewed and are negative.    BP 150/75  Pulse 67  Ht 5\' 7"  (1.702 m)  Wt  178 lb (80.74 kg)  BMI 27.88 kg/m2  Physical Exam  Nursing note and vitals reviewed. Constitutional: She is oriented to person, place, and time. She appears well-developed and well-nourished.  HENT:  Head: Normocephalic.  Nose: Nose normal.  Mouth/Throat: Oropharynx is clear and moist.  Eyes: Conjunctivae are normal. Pupils are equal, round, and reactive to light.  Neck: Normal range of motion. Neck supple. No JVD present. Carotid bruit is present.  Cardiovascular: Normal rate, regular rhythm, S1 normal, S2 normal and intact distal pulses.  Exam reveals no gallop and no friction rub.   Murmur heard.  Crescendo systolic murmur is present with a grade of 2/6  Pulmonary/Chest: Effort normal and breath sounds normal. No respiratory distress. She has no wheezes. She has no rales. She  exhibits no tenderness.  Abdominal: Soft. Bowel sounds are normal. She exhibits no distension. There is no tenderness.  Musculoskeletal: Normal range of motion. She exhibits no edema and no tenderness.  Lymphadenopathy:    She has no cervical adenopathy.  Neurological: She is alert and oriented to person, place, and time. Coordination normal.  Skin: Skin is warm and dry. No rash noted. No erythema.  Psychiatric: She has a normal mood and affect. Her behavior is normal. Judgment and thought content normal.         Assessment and Plan

## 2011-05-21 LAB — CBC WITH DIFFERENTIAL/PLATELET
Basophils Absolute: 0 10*3/uL (ref 0.0–0.2)
Basos: 1 % (ref 0–3)
Eos: 2 % (ref 0–7)
Eosinophils Absolute: 0.1 10*3/uL (ref 0.0–0.4)
HCT: 34.2 % (ref 34.0–46.6)
Hemoglobin: 11.1 g/dL (ref 11.1–15.9)
Immature Grans (Abs): 0 10*3/uL (ref 0.0–0.1)
Immature Granulocytes: 0 % (ref 0–2)
Lymphocytes Absolute: 1.2 10*3/uL (ref 0.7–4.5)
Lymphs: 20 % (ref 14–46)
MCH: 28.5 pg (ref 26.6–33.0)
MCHC: 32.5 g/dL (ref 31.5–35.7)
MCV: 88 fL (ref 79–97)
Monocytes Absolute: 0.7 10*3/uL (ref 0.1–1.0)
Monocytes: 12 % (ref 4–13)
Neutrophils Absolute: 3.8 10*3/uL (ref 1.8–7.8)
Neutrophils Relative %: 65 % (ref 40–74)
RBC: 3.9 x10E6/uL (ref 3.77–5.28)
RDW: 14.4 % (ref 12.3–15.4)
WBC: 5.8 10*3/uL (ref 4.0–10.5)

## 2011-05-21 LAB — BASIC METABOLIC PANEL
BUN/Creatinine Ratio: 14 (ref 11–26)
BUN: 16 mg/dL (ref 8–27)
CO2: 25 mmol/L (ref 20–32)
Calcium: 8.9 mg/dL (ref 8.6–10.2)
Chloride: 101 mmol/L (ref 97–108)
Creatinine, Ser: 1.16 mg/dL — ABNORMAL HIGH (ref 0.57–1.00)
GFR calc Af Amer: 53 mL/min/{1.73_m2} — ABNORMAL LOW (ref >59)
GFR calc non Af Amer: 46 mL/min/{1.73_m2} — ABNORMAL LOW (ref >59)
Glucose: 97 mg/dL (ref 65–99)
Potassium: 4.2 mmol/L (ref 3.5–5.2)
Sodium: 139 mmol/L (ref 134–144)

## 2011-05-22 ENCOUNTER — Telehealth: Payer: Self-pay | Admitting: Family Medicine

## 2011-05-22 MED ORDER — PROMETHAZINE HCL 12.5 MG PO TABS: 12.5000 mg | ORAL_TABLET | Freq: Three times a day (TID) | ORAL | Status: DC | PRN

## 2011-05-22 NOTE — Telephone Encounter (Signed)
Caller: [name]/Patient is calling with a question about Phenegan.  [name]). Pt reports hip replacement 05/03/11, states Phenergan used for nausea at Rehab, is requesting a new RX for intermittent nausea, states was told due to meds she uses and surgery.  Home Health nurse, with recent discharge from [place]. Symptoms reviewed Nausea Guideline, with difficulty taking RX meds,  pt is requesting RX to  [facility]. Please call [phone].

## 2011-05-22 NOTE — Telephone Encounter (Signed)
Rx sent to [facility].

## 2011-05-22 NOTE — Telephone Encounter (Signed)
Triage Record Num: [id number] Operator: Earnest Rosier Patient Name: [name] Call Date & Time: 05/22/2011 9:50:33AM Patient Phone: [phone] PCP: [name] Patient Gender: Female PCP Fax : [phone] Patient DOB: 1933/09/29 Practice Name: [facility] Reason for Call: Caller: [name]/Patient is calling with a question about Phenegan. [name] [place]). Pt reports hip replacement 05/03/11, states Phenergan used for nausea at Rehab, is requesting a new RX for intermittent nausea, states was told due to meds she uses and surgery. Home Health nurse, with recent discharge from [place]. Symptoms reviewed Nausea Guideline, with difficulty taking RX medsm, pt is requesting RX to [facility]. Please call [phone]. Note in EPIC Protocol(s) Used: Nausea or Vomiting Recommended Outcome per Protocol: Call Provider Immediately Reason for Outcome: Unable to take or keep down prescription medication (heart, respiratory, diabetes, thyroid, antibiotics, birth control pills, corticosteroids) because of nausea/vomiting Care Advice: ~ 05/22/2011 10:08:10AM Page 1 of 1 [id number]

## 2011-05-22 NOTE — Telephone Encounter (Signed)
See previous phone note dated 05/22/2011, this has already be addressed.

## 2011-05-22 NOTE — Telephone Encounter (Signed)
Patient advised as instructed via telephone. 

## 2011-05-30 ENCOUNTER — Telehealth: Payer: Self-pay

## 2011-05-30 NOTE — Telephone Encounter (Signed)
Pt left v/m stating Dr Rockey Situ wants to change cholesterol med to Crestor 40 mg. Dr Rockey Situ did not have samples of Crestor 40 mg and asked pt to call Dr Elonda Husky office to see if she had samples pt could have.Pt can be reached at [phone].Please advise.

## 2011-05-31 NOTE — Telephone Encounter (Signed)
We do have samples, patient advised via telephone samples will be left at front desk.

## 2011-05-31 NOTE — Telephone Encounter (Signed)
If we have samples, ok to give them to her.

## 2011-06-20 ENCOUNTER — Ambulatory Visit: Payer: Medicare Other | Admitting: Cardiovascular Disease

## 2011-06-20 ENCOUNTER — Ambulatory Visit (INDEPENDENT_AMBULATORY_CARE_PROVIDER_SITE_OTHER): Payer: Medicare Other | Admitting: Family Medicine

## 2011-06-20 DIAGNOSIS — Z5181 Encounter for therapeutic drug level monitoring: Secondary | ICD-10-CM

## 2011-06-20 DIAGNOSIS — I4891 Unspecified atrial fibrillation: Secondary | ICD-10-CM

## 2011-06-20 DIAGNOSIS — Z7901 Long term (current) use of anticoagulants: Secondary | ICD-10-CM

## 2011-06-20 LAB — POCT INR: INR: 3

## 2011-06-20 NOTE — Patient Instructions (Signed)
Continue  2.5 mg daily recheck 2 weeks

## 2011-06-21 ENCOUNTER — Ambulatory Visit (INDEPENDENT_AMBULATORY_CARE_PROVIDER_SITE_OTHER): Payer: Medicare Other | Admitting: Family Medicine

## 2011-06-21 ENCOUNTER — Encounter: Payer: Self-pay | Admitting: Family Medicine

## 2011-06-21 VITALS — BP 110/68 | HR 68 | Temp 97.9°F | Wt 177.8 lb

## 2011-06-21 DIAGNOSIS — J019 Acute sinusitis, unspecified: Secondary | ICD-10-CM | POA: Insufficient documentation

## 2011-06-21 DIAGNOSIS — R300 Dysuria: Secondary | ICD-10-CM

## 2011-06-21 DIAGNOSIS — N3289 Other specified disorders of bladder: Secondary | ICD-10-CM

## 2011-06-21 DIAGNOSIS — R3989 Other symptoms and signs involving the genitourinary system: Secondary | ICD-10-CM

## 2011-06-21 LAB — POCT URINALYSIS DIPSTICK
Bilirubin, UA: NEGATIVE
Glucose, UA: NEGATIVE
Ketones, UA: NEGATIVE
Nitrite, UA: POSITIVE
Spec Grav, UA: 1.025
Urobilinogen, UA: 0.2
pH, UA: 6

## 2011-06-21 MED ORDER — CEPHALEXIN 500 MG PO CAPS: 500.0000 mg | ORAL_CAPSULE | Freq: Three times a day (TID) | ORAL | Status: AC

## 2011-06-21 MED ORDER — LEVOFLOXACIN 500 MG PO TABS: 500.0000 mg | ORAL_TABLET | Freq: Every day | ORAL | Status: DC

## 2011-06-21 MED ORDER — CEPHALEXIN 500 MG PO CAPS: 500.0000 mg | ORAL_CAPSULE | Freq: Three times a day (TID) | ORAL | Status: DC

## 2011-06-21 NOTE — Assessment & Plan Note (Addendum)
UA consistent with infection.  Did not do micro. Sent off culture. Treat with keflex 500mg  tid x 10 days (to cover sinusitis as well).

## 2011-06-21 NOTE — Progress Notes (Signed)
Addended by: Ria Bush on: 06/21/2011 03:47 PM   Modules accepted: Orders, Medications

## 2011-06-21 NOTE — Progress Notes (Signed)
  Subjective:    Patient ID: [name], female    DOB: 1933/07/24, 78 y.o.   MRN: [id number]  HPI CC: sinuses and ?UTI  3d h/o sinus congestion symptoms.  Now having facial pressure, PNdrainage.  Tends to get recurrent sinus infections.  zpack tends to help.    Zyrtec helps some with dainage  Also wanted urine checked.  H/o R kidney cancer s/p removal.  Doesn't feel fully emptying, some burning at end stream.  + urgency.  Stronger smell.  No frequency or hematuria, n/v, back pain.  No fevers/chills, abd pain, n/v, ear pain or tooth pain.  No cough. No smokers at home, no sick contacts at home. No h/o asthma or COPD.  Review of Systems per HPI   Objective:   Physical Exam  Nursing note and vitals reviewed. Constitutional: She appears well-developed and well-nourished. No distress.  HENT:  Head: Normocephalic and atraumatic.  Right Ear: Hearing, external ear and ear canal normal.  Left Ear: Hearing, external ear and ear canal normal.  Nose: Mucosal edema present. No rhinorrhea. Right sinus exhibits maxillary sinus tenderness. Right sinus exhibits no frontal sinus tenderness. Left sinus exhibits maxillary sinus tenderness. Left sinus exhibits no frontal sinus tenderness.  Mouth/Throat: Uvula is midline, oropharynx is clear and moist and mucous membranes are normal. No oropharyngeal exudate, posterior oropharyngeal edema, posterior oropharyngeal erythema or tonsillar abscesses.       Cerumen covering bilateral TMs Left turbinate irritated  Eyes: Conjunctivae and EOM are normal. Pupils are equal, round, and reactive to light. No scleral icterus.  Neck: Normal range of motion. Neck supple.  Cardiovascular: Normal rate, regular rhythm, normal heart sounds and intact distal pulses.   No murmur heard. Pulmonary/Chest: Effort normal and breath sounds normal. No respiratory distress. She has no wheezes. She has no rales.  Abdominal: Soft. Bowel sounds are normal. She exhibits no  distension. There is no hepatosplenomegaly. There is no tenderness. There is no rebound, no guarding and no CVA tenderness.  Musculoskeletal: She exhibits no edema.  Lymphadenopathy:    She has no cervical adenopathy.  Skin: Skin is warm and dry. No rash noted.       Assessment & Plan:

## 2011-06-21 NOTE — Patient Instructions (Addendum)
Looks like sinus and urinary tract infection. Treat with keflex three times daily for 7 days. Update [place] if symptoms not improving with this. Call coumadin clinic today to titrate coumadin dosing (antibiotic will thin your blood).   Hold coumadin dose Sunday, Tuesday recheck INR on Friday 06/28/11. Dorma Russell

## 2011-06-21 NOTE — Assessment & Plan Note (Addendum)
Anticipate sinusitis, given comorbidities and tendency to have sinus infections, will treat with abx. Chose keflex to treat urinary infection as well. rec pt call coumadin clinic to titrate coumadin.

## 2011-06-24 LAB — URINE CULTURE: Colony Count: >=100000

## 2011-07-01 ENCOUNTER — Ambulatory Visit: Payer: Medicare Other

## 2011-07-02 ENCOUNTER — Encounter: Payer: Medicare Other | Admitting: Internal Medicine

## 2011-07-04 ENCOUNTER — Ambulatory Visit: Payer: Medicare Other

## 2011-07-11 ENCOUNTER — Other Ambulatory Visit: Payer: Medicare Other

## 2011-07-12 ENCOUNTER — Ambulatory Visit (INDEPENDENT_AMBULATORY_CARE_PROVIDER_SITE_OTHER): Payer: Medicare Other | Admitting: Family Medicine

## 2011-07-12 DIAGNOSIS — I4891 Unspecified atrial fibrillation: Secondary | ICD-10-CM

## 2011-07-12 DIAGNOSIS — Z7901 Long term (current) use of anticoagulants: Secondary | ICD-10-CM

## 2011-07-12 DIAGNOSIS — Z5181 Encounter for therapeutic drug level monitoring: Secondary | ICD-10-CM

## 2011-07-12 LAB — POCT INR: INR: 2.7

## 2011-07-12 NOTE — Patient Instructions (Signed)
Continue current dose, check in 4 weeks  

## 2011-07-22 ENCOUNTER — Ambulatory Visit (INDEPENDENT_AMBULATORY_CARE_PROVIDER_SITE_OTHER): Payer: Medicare Other | Admitting: Internal Medicine

## 2011-07-22 ENCOUNTER — Encounter: Payer: Self-pay | Admitting: Internal Medicine

## 2011-07-22 VITALS — BP 148/86 | HR 74 | Ht 67.0 in | Wt 180.0 lb

## 2011-07-22 DIAGNOSIS — I4891 Unspecified atrial fibrillation: Secondary | ICD-10-CM

## 2011-07-22 DIAGNOSIS — I495 Sick sinus syndrome: Secondary | ICD-10-CM

## 2011-07-22 DIAGNOSIS — I421 Obstructive hypertrophic cardiomyopathy: Secondary | ICD-10-CM

## 2011-07-22 DIAGNOSIS — I429 Cardiomyopathy, unspecified: Secondary | ICD-10-CM

## 2011-07-22 DIAGNOSIS — I5042 Chronic combined systolic (congestive) and diastolic (congestive) heart failure: Secondary | ICD-10-CM

## 2011-07-22 DIAGNOSIS — Z95810 Presence of automatic (implantable) cardiac defibrillator: Secondary | ICD-10-CM

## 2011-07-22 LAB — ICD DEVICE OBSERVATION
AL AMPLITUDE: 1.375 mv
AL IMPEDENCE ICD: 456 Ohm
AL THRESHOLD: 1.125 V
ATRIAL PACING ICD: 92.18 pct
BAMS-0001: 170 {beats}/min
BATTERY VOLTAGE: 3.1594 V
CHARGE TIME: 8.638 s
DEV-0020ICD: NEGATIVE
FVT: 0
PACEART VT: 0
RV LEAD AMPLITUDE: 11.125 mv
RV LEAD IMPEDENCE ICD: 513 Ohm
RV LEAD THRESHOLD: 1 V
TOT-0001: 1
TOT-0002: 0
TOT-0006: 20111230000000
TZAT-0001ATACH: 1
TZAT-0001ATACH: 2
TZAT-0001ATACH: 3
TZAT-0001FASTVT: 1
TZAT-0001SLOWVT: 1
TZAT-0001SLOWVT: 2
TZAT-0002ATACH: NEGATIVE
TZAT-0002ATACH: NEGATIVE
TZAT-0002ATACH: NEGATIVE
TZAT-0002FASTVT: NEGATIVE
TZAT-0004SLOWVT: 8
TZAT-0004SLOWVT: 8
TZAT-0005SLOWVT: 84 pct
TZAT-0005SLOWVT: 91 pct
TZAT-0011SLOWVT: 10 ms
TZAT-0011SLOWVT: 10 ms
TZAT-0012ATACH: 150 ms
TZAT-0012ATACH: 150 ms
TZAT-0012ATACH: 150 ms
TZAT-0012FASTVT: 170 ms
TZAT-0012SLOWVT: 170 ms
TZAT-0012SLOWVT: 170 ms
TZAT-0013SLOWVT: 2
TZAT-0013SLOWVT: 2
TZAT-0018ATACH: NEGATIVE
TZAT-0018ATACH: NEGATIVE
TZAT-0018ATACH: NEGATIVE
TZAT-0018FASTVT: NEGATIVE
TZAT-0018SLOWVT: NEGATIVE
TZAT-0018SLOWVT: NEGATIVE
TZAT-0019ATACH: 6 V
TZAT-0019ATACH: 6 V
TZAT-0019ATACH: 6 V
TZAT-0019FASTVT: 8 V
TZAT-0019SLOWVT: 8 V
TZAT-0019SLOWVT: 8 V
TZAT-0020ATACH: 1.5 ms
TZAT-0020ATACH: 1.5 ms
TZAT-0020ATACH: 1.5 ms
TZAT-0020FASTVT: 1.5 ms
TZAT-0020SLOWVT: 1.5 ms
TZAT-0020SLOWVT: 1.5 ms
TZON-0003ATACH: 350 ms
TZON-0003SLOWVT: 350 ms
TZON-0003VSLOWVT: 400 ms
TZON-0004SLOWVT: 16
TZON-0004VSLOWVT: 20
TZON-0005SLOWVT: 12
TZST-0001ATACH: 4
TZST-0001ATACH: 5
TZST-0001ATACH: 6
TZST-0001FASTVT: 2
TZST-0001FASTVT: 3
TZST-0001FASTVT: 4
TZST-0001FASTVT: 5
TZST-0001FASTVT: 6
TZST-0001SLOWVT: 3
TZST-0001SLOWVT: 4
TZST-0001SLOWVT: 5
TZST-0001SLOWVT: 6
TZST-0002ATACH: NEGATIVE
TZST-0002ATACH: NEGATIVE
TZST-0002ATACH: NEGATIVE
TZST-0002FASTVT: NEGATIVE
TZST-0002FASTVT: NEGATIVE
TZST-0002FASTVT: NEGATIVE
TZST-0002FASTVT: NEGATIVE
TZST-0002FASTVT: NEGATIVE
TZST-0003SLOWVT: 25 J
TZST-0003SLOWVT: 35 J
TZST-0003SLOWVT: 35 J
TZST-0003SLOWVT: 35 J
VENTRICULAR PACING ICD: 98.49 pct
VF: 0

## 2011-07-22 MED ORDER — FUROSEMIDE 40 MG PO TABS: 40.0000 mg | ORAL_TABLET | Freq: Every day | ORAL | Status: DC

## 2011-07-22 MED ORDER — POTASSIUM CHLORIDE ER 10 MEQ PO TBCR: 10.0000 meq | EXTENDED_RELEASE_TABLET | Freq: Two times a day (BID) | ORAL | Status: DC

## 2011-07-22 NOTE — Assessment & Plan Note (Signed)
Her fluid index is up and her symptoms are class 2. I have recommended she stop HCTZ and start daily lasix. She will continue daily potassium.

## 2011-07-22 NOTE — Patient Instructions (Signed)
Need to follow up with Dr. Lovena Le 6 months. Need to follow up with Nevin Bloodgood in 3 months.  Stop HCTZ. Start taking Lasix 40 mg take one tablet daily. Start taking potassium chloride 10 MEQ take two tablets daily.

## 2011-07-22 NOTE — Assessment & Plan Note (Signed)
Her device is working normally. Will recheck in several months. 

## 2011-07-22 NOTE — Assessment & Plan Note (Signed)
She is mostly maintaining NSR. Will follow.

## 2011-07-22 NOTE — Progress Notes (Signed)
HPI [name] returns today for followup. She is a pleasant 78 yo woman with a h/o a DCM, CHF, HTN, and atrial fib. She has been stable in the interim except for mild worsening in her CHF symptoms. She has not required hospitalization. She denies c/p or syncope. Minimal peripheral edema. Allergies  Allergen Reactions  . Codeine     REACTION: N \\T \ V  . Sulfonamide Derivatives     REACTION: Dry mouth     Current Outpatient Prescriptions  Medication Sig Dispense Refill  . aspirin 81 MG EC tablet Take 81 mg by mouth daily after breakfast.       . atorvastatin (LIPITOR) 40 MG tablet Take 40 mg by mouth every evening.       . carvedilol (COREG) 12.5 MG tablet Take 1 tablet (12.5 mg total) by mouth 2 (two) times daily with a meal.  60 tablet  3  . cetirizine (ZYRTEC) 10 MG tablet Take 10 mg by mouth daily.       . disopyramide (NORPACE) 150 MG capsule Take 1 capsule (150 mg total) by mouth 2 (two) times daily.  60 capsule  6  . docusate sodium (COLACE) 250 MG capsule Take 250 mg by mouth daily.      . verapamil (CALAN-SR) 180 MG CR tablet Take 180 mg by mouth at bedtime.       Marland Kitchen warfarin (COUMADIN) 5 MG tablet Take 2.5 mg by mouth daily.       . furosemide (LASIX) 40 MG tablet Take 1 tablet (40 mg total) by mouth daily.  30 tablet  4  . potassium chloride (K-DUR) 10 MEQ tablet Take 1 tablet (10 mEq total) by mouth 2 (two) times daily.  60 tablet  3     Past Medical History  Diagnosis Date  . Carotid artery stenosis 09/2007    60-79% bilateral (stable)  . Carotid artery stenosis 10/2008    40-59% R 60-79%   . Chest pain, unspecified   . Automatic implantable cardiac defibrillator in situ   . Atrial fibrillation   . Sinus bradycardia   . Hypotension, unspecified     cardiac cath 2006..nonobstructive CAD 30-40s lesions.Marland KitchenETT 1/09 nondiagnostic due to poor HR response..Right Renal Cancer 2003  . History of hysterectomy 1975    FOR BENIGN CAUSES  . Adjustment disorder with anxiety   .  Blood in stool   . Secondary cardiomyopathy, unspecified   . Chronic airway obstruction, not elsewhere classified   . Encounter for long-term (current) use of anticoagulants   . Dysuria   . Edema   . Encounter for therapeutic drug monitoring   . Other malaise and fatigue   . Hypertrophic obstructive cardiomyopathy   . Other and unspecified hyperlipidemia   . Urge incontinence   . Other acute sinusitis   . Other and unspecified coagulation defects   . Other screening mammogram   . Pre-operative cardiovascular examination   . Hemorrhage of rectum and anus   . Chronic kidney disease, unspecified   . Special screening for osteoporosis   . Renal cancer 05-01-11    2'03-Right  . Malignant neoplasm of kidney, except pelvis   . Shortness of breath 05-01-11    exertional SOB only  . Arthritis 05-01-11    Right hip osteoarthritis  . Hypertension 05/20/2011    ROS:   All systems reviewed and negative except as noted in the HPI.   Past Surgical History  Procedure Date  . Abdominal hysterectomy 1975  for benign causes  . Cardiac catheterization 2006  . Nephrectomy     2'03-RIGHT S/P RENAL CELL CANCER  . Cataract extraction 05-01-11    bil. with lens implant  . Insert / replace / remove pacemaker 05-01-11    02-28-05-/05-04-10-ICD-MEDTRONIC MAXIMAL DR  . Eye surgery 05-01-11    bil. cataract only  . Appendectomy   . Total hip arthroplasty 05/03/2011    Procedure: TOTAL HIP ARTHROPLASTY ANTERIOR APPROACH;  Surgeon: Mcarthur Rossetti;  Location: [facility];  Service: Orthopedics;  Laterality: Right;  Removal of Cannulated Screws Right Hip, Right Direct Anterior Hip Replacement     Family History  Problem Relation Age of Onset  . Colon cancer Neg Hx      History   Social History  . Marital Status: Widowed    Spouse Name: N/A    Number of Children: N/A  . Years of Education: N/A   Occupational History  . Retired    Social History Main Topics  . Smoking status:  Former Smoker -- 0.5 packs/day for 40 years    Types: Cigarettes    Quit date: 05/06/2001  . Smokeless tobacco: Not on file  . Alcohol Use: No  . Drug Use: No  . Sexually Active: No   Other Topics Concern  . 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 LIVE CLOSE BY BUT SHE LIVES [place], HAS GROUP OF 4 BEST FRIENDS, SELF TITLED "THE GOLDEN GIRLS".ALCOHOL USE-NORETIREDFORMER TOBACCO USE.....1/2 PACK X 40 YRS UNTIL 2003     BP 148/86  Pulse 74  Ht 5\' 7"  (1.702 m)  Wt 81.647 kg (180 lb)  BMI 28.19 kg/m2  Physical Exam:  Well appearing elderly woman, NAD HEENT: Unremarkable Neck:  No JVD, no thyromegally Lungs:  Clear with no wheezes, rales, or rhonchi HEART:  Regular rate rhythm, no murmurs, no rubs, no clicks Abd:  soft, positive bowel sounds, no organomegally, no rebound, no guarding Ext:  2 plus pulses, no edema, no cyanosis, no clubbing Skin:  No rashes no nodules Neuro:  CN II through XII intact, motor grossly intact  DEVICE  Normal device function.  See PaceArt for details.   Assess/Plan:

## 2011-07-23 ENCOUNTER — Encounter: Payer: Self-pay | Admitting: Internal Medicine

## 2011-08-13 ENCOUNTER — Ambulatory Visit: Payer: Medicare Other

## 2011-08-16 ENCOUNTER — Other Ambulatory Visit: Payer: Self-pay | Admitting: *Deleted

## 2011-08-16 MED ORDER — CARVEDILOL 12.5 MG PO TABS: 12.5000 mg | ORAL_TABLET | Freq: Two times a day (BID) | ORAL | Status: DC

## 2011-08-19 ENCOUNTER — Ambulatory Visit (INDEPENDENT_AMBULATORY_CARE_PROVIDER_SITE_OTHER): Payer: Medicare Other | Admitting: Family Medicine

## 2011-08-19 DIAGNOSIS — Z5181 Encounter for therapeutic drug level monitoring: Secondary | ICD-10-CM

## 2011-08-19 DIAGNOSIS — Z7901 Long term (current) use of anticoagulants: Secondary | ICD-10-CM

## 2011-08-19 LAB — POCT INR: INR: 3.5

## 2011-08-19 NOTE — Patient Instructions (Signed)
Continue 2.5 mg daily, eat more veggies to thicken blood without changing dose. Patient eating less trying to lose weight. Will recheck in 1 week

## 2011-08-27 ENCOUNTER — Ambulatory Visit (INDEPENDENT_AMBULATORY_CARE_PROVIDER_SITE_OTHER): Payer: Medicare Other | Admitting: Family Medicine

## 2011-08-27 DIAGNOSIS — Z5181 Encounter for therapeutic drug level monitoring: Secondary | ICD-10-CM

## 2011-08-27 DIAGNOSIS — I4891 Unspecified atrial fibrillation: Secondary | ICD-10-CM

## 2011-08-27 DIAGNOSIS — Z7901 Long term (current) use of anticoagulants: Secondary | ICD-10-CM

## 2011-08-27 LAB — POCT INR: INR: 2.9

## 2011-08-27 NOTE — Patient Instructions (Signed)
Continue current dose, check in 4 weeks  

## 2011-09-24 ENCOUNTER — Ambulatory Visit: Payer: Medicare Other

## 2011-09-24 ENCOUNTER — Ambulatory Visit (INDEPENDENT_AMBULATORY_CARE_PROVIDER_SITE_OTHER): Payer: Medicare Other | Admitting: Family Medicine

## 2011-09-24 ENCOUNTER — Encounter: Payer: Self-pay | Admitting: Family Medicine

## 2011-09-24 VITALS — BP 140/64 | HR 76 | Temp 98.0°F | Wt 181.0 lb

## 2011-09-24 DIAGNOSIS — R300 Dysuria: Secondary | ICD-10-CM

## 2011-09-24 DIAGNOSIS — J019 Acute sinusitis, unspecified: Secondary | ICD-10-CM

## 2011-09-24 LAB — POCT URINALYSIS DIPSTICK
Bilirubin, UA: NEGATIVE
Glucose, UA: NEGATIVE
Ketones, UA: NEGATIVE
Nitrite, UA: NEGATIVE
Protein, UA: NEGATIVE
Spec Grav, UA: 1.02
Urobilinogen, UA: NEGATIVE
pH, UA: 6.5

## 2011-09-24 NOTE — Patient Instructions (Signed)
Good to see you. Drink plenty of fluids and we will call you with your urine culture results.

## 2011-09-24 NOTE — Progress Notes (Signed)
Subjective:    Patient ID: [name], female    DOB: [birth year], 78 y.o.   MRN: [id number]  HPI  1.  URI-  Had saw throat 3 days ago, now having some runny nose and dry cough. No CP, no wheezing, no SOB. No fevers.  2.  ?UTI-  Some increased urinary frequency.  No dysuria.  No fevers.  No back pain. Concerned due to having only 1 kidney No vaginal discharge or rash Current Outpatient Prescriptions on File Prior to Visit  Medication Sig Dispense Refill  . aspirin 81 MG EC tablet Take 81 mg by mouth daily after breakfast.       . atorvastatin (LIPITOR) 40 MG tablet Take 40 mg by mouth every evening.       . carvedilol (COREG) 12.5 MG tablet Take 1 tablet (12.5 mg total) by mouth 2 (two) times daily with a meal.  60 tablet  3  . cetirizine (ZYRTEC) 10 MG tablet Take 10 mg by mouth daily.       . disopyramide (NORPACE) 150 MG capsule Take 1 capsule (150 mg total) by mouth 2 (two) times daily.  60 capsule  6  . docusate sodium (COLACE) 250 MG capsule Take 250 mg by mouth daily.      . furosemide (LASIX) 40 MG tablet Take 1 tablet (40 mg total) by mouth daily.  30 tablet  4  . potassium chloride (K-DUR) 10 MEQ tablet Take 1 tablet (10 mEq total) by mouth 2 (two) times daily.  60 tablet  3  . verapamil (CALAN-SR) 180 MG CR tablet Take 180 mg by mouth at bedtime.       Marland Kitchen warfarin (COUMADIN) 5 MG tablet Take 2.5 mg by mouth daily.       Marland Kitchen DISCONTD: hydrochlorothiazide (HYDRODIURIL) 25 MG tablet Take 25 mg by mouth daily after breakfast.       . DISCONTD: potassium chloride SA (KLOR-CON M20) 20 MEQ tablet Take 20 mEq by mouth daily.         Allergies  Allergen Reactions  . Codeine     REACTION: N \\T \ V  . Sulfonamide Derivatives     REACTION: Dry mouth    Past Medical History  Diagnosis Date  . Carotid artery stenosis 09/2007    60-79% bilateral (stable)  . Carotid artery stenosis 10/2008    40-59% R 60-79%   . Chest pain, unspecified   . Automatic implantable cardiac  defibrillator in situ   . Atrial fibrillation   . Sinus bradycardia   . Hypotension, unspecified     cardiac cath 2006..nonobstructive CAD 30-40s lesions.Marland KitchenETT 1/09 nondiagnostic due to poor HR response..Right Renal Cancer 2003  . History of hysterectomy 1975    FOR BENIGN CAUSES  . Adjustment disorder with anxiety   . Blood in stool   . Secondary cardiomyopathy, unspecified   . Chronic airway obstruction, not elsewhere classified   . Encounter for long-term (current) use of anticoagulants   . Dysuria   . Edema   . Encounter for therapeutic drug monitoring   . Other malaise and fatigue   . Hypertrophic obstructive cardiomyopathy   . Other and unspecified hyperlipidemia   . Urge incontinence   . Other acute sinusitis   . Other and unspecified coagulation defects   . Other screening mammogram   . Pre-operative cardiovascular examination   . Hemorrhage of rectum and anus   . Chronic kidney disease, unspecified   . Special screening for osteoporosis   .  Renal cancer 05-01-11    2'03-Right  . Malignant neoplasm of kidney, except pelvis   . Shortness of breath 05-01-11    exertional SOB only  . Arthritis 05-01-11    Right hip osteoarthritis  . Hypertension 05/20/2011    Past Surgical History  Procedure Date  . Abdominal hysterectomy 1975    for benign causes  . Cardiac catheterization 2006  . Nephrectomy     2'03-RIGHT S/P RENAL CELL CANCER  . Cataract extraction 05-01-11    bil. with lens implant  . Insert / replace / remove pacemaker 05-01-11    02-28-05-/05-04-10-ICD-MEDTRONIC MAXIMAL DR  . Eye surgery 05-01-11    bil. cataract only  . Appendectomy   . Total hip arthroplasty 05/03/2011    Procedure: TOTAL HIP ARTHROPLASTY ANTERIOR APPROACH;  Surgeon: Mcarthur Rossetti;  Location: [facility];  Service: Orthopedics;  Laterality: Right;  Removal of Cannulated Screws Right Hip, Right Direct Anterior Hip Replacement    Family History  Problem Relation Age of Onset  .  Colon cancer Neg Hx     History   Social History  . Marital Status: Widowed    Spouse Name: N/A    Number of Children: N/A  . Years of Education: N/A   Occupational History  . Retired    Social History Main Topics  . Smoking status: Former Smoker -- 0.5 packs/day for 40 years    Types: Cigarettes    Quit date: 05/06/2001  . Smokeless tobacco: Not on file  . Alcohol Use: No  . Drug Use: No  . Sexually Active: No   Other Topics Concern  . 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 LIVE CLOSE BY BUT SHE LIVES [place], HAS GROUP OF 4 BEST FRIENDS, SELF TITLED "THE GOLDEN GIRLS".ALCOHOL USE-NORETIREDFORMER TOBACCO USE.....1/2 PACK X 40 YRS UNTIL 2003   Review of Systems No nausea or vomiting     Objective:   Physical Exam  Constitutional: She appears well-developed and well-nourished. No distress.  HEENT:  Pos PND, no sinus tenderness Resp:  CTA bilaterally CVS: RRR Abdominal: Soft. There is no tenderness.  Musculoskeletal:       No CVA tenderness         Assessment & Plan:  1.  Allergic rhinitis- Discuss antihistamines- was previously using allegra. Advised to restart.  Call clinic if no improvement in 5-7 days. The patient indicates understanding of these issues and agrees with the plan.  2.  Increased urinary frequency- Likely due to lasix. Pos microscopic hematuria.  Will send for cx.

## 2011-09-26 ENCOUNTER — Other Ambulatory Visit: Payer: Self-pay | Admitting: Family Medicine

## 2011-09-26 LAB — URINE CULTURE: Colony Count: 1000

## 2011-09-26 MED ORDER — AMOXICILLIN-POT CLAVULANATE 875-125 MG PO TABS: 1.0000 | ORAL_TABLET | Freq: Two times a day (BID) | ORAL | Status: AC

## 2011-10-07 ENCOUNTER — Other Ambulatory Visit: Payer: Self-pay | Admitting: *Deleted

## 2011-10-07 MED ORDER — ATORVASTATIN CALCIUM 40 MG PO TABS: 40.0000 mg | ORAL_TABLET | Freq: Every evening | ORAL | Status: DC

## 2011-10-07 NOTE — Telephone Encounter (Signed)
Refilled Atorvastatin Calcium.

## 2011-10-16 ENCOUNTER — Ambulatory Visit: Payer: Medicare Other

## 2011-10-17 ENCOUNTER — Encounter: Payer: Self-pay | Admitting: Internal Medicine

## 2011-10-17 ENCOUNTER — Ambulatory Visit (INDEPENDENT_AMBULATORY_CARE_PROVIDER_SITE_OTHER): Payer: Medicare Other | Admitting: Family Medicine

## 2011-10-17 ENCOUNTER — Ambulatory Visit (INDEPENDENT_AMBULATORY_CARE_PROVIDER_SITE_OTHER): Payer: Medicare Other | Admitting: *Deleted

## 2011-10-17 DIAGNOSIS — I421 Obstructive hypertrophic cardiomyopathy: Secondary | ICD-10-CM

## 2011-10-17 DIAGNOSIS — I5042 Chronic combined systolic (congestive) and diastolic (congestive) heart failure: Secondary | ICD-10-CM

## 2011-10-17 DIAGNOSIS — Z7901 Long term (current) use of anticoagulants: Secondary | ICD-10-CM

## 2011-10-17 DIAGNOSIS — I4891 Unspecified atrial fibrillation: Secondary | ICD-10-CM

## 2011-10-17 DIAGNOSIS — Z5181 Encounter for therapeutic drug level monitoring: Secondary | ICD-10-CM

## 2011-10-17 LAB — ICD DEVICE OBSERVATION
AL AMPLITUDE: 1.125 mv
AL IMPEDENCE ICD: 456 Ohm
AL THRESHOLD: 1.125 V
ATRIAL PACING ICD: 89.13 pct
BAMS-0001: 170 {beats}/min
BATTERY VOLTAGE: 3.1457 V
CHARGE TIME: 8.638 s
DEV-0020ICD: NEGATIVE
FVT: 0
PACEART VT: 0
RV LEAD AMPLITUDE: 11 mv
RV LEAD IMPEDENCE ICD: 532 Ohm
RV LEAD THRESHOLD: 1 V
TOT-0001: 1
TOT-0002: 0
TOT-0006: 20111230000000
TZAT-0001ATACH: 1
TZAT-0001ATACH: 2
TZAT-0001ATACH: 3
TZAT-0001FASTVT: 1
TZAT-0001SLOWVT: 1
TZAT-0001SLOWVT: 2
TZAT-0002ATACH: NEGATIVE
TZAT-0002ATACH: NEGATIVE
TZAT-0002ATACH: NEGATIVE
TZAT-0002FASTVT: NEGATIVE
TZAT-0004SLOWVT: 8
TZAT-0004SLOWVT: 8
TZAT-0005SLOWVT: 84 pct
TZAT-0005SLOWVT: 91 pct
TZAT-0011SLOWVT: 10 ms
TZAT-0011SLOWVT: 10 ms
TZAT-0012ATACH: 150 ms
TZAT-0012ATACH: 150 ms
TZAT-0012ATACH: 150 ms
TZAT-0012FASTVT: 170 ms
TZAT-0012SLOWVT: 170 ms
TZAT-0012SLOWVT: 170 ms
TZAT-0013SLOWVT: 2
TZAT-0013SLOWVT: 2
TZAT-0018ATACH: NEGATIVE
TZAT-0018ATACH: NEGATIVE
TZAT-0018ATACH: NEGATIVE
TZAT-0018FASTVT: NEGATIVE
TZAT-0018SLOWVT: NEGATIVE
TZAT-0018SLOWVT: NEGATIVE
TZAT-0019ATACH: 6 V
TZAT-0019ATACH: 6 V
TZAT-0019ATACH: 6 V
TZAT-0019FASTVT: 8 V
TZAT-0019SLOWVT: 8 V
TZAT-0019SLOWVT: 8 V
TZAT-0020ATACH: 1.5 ms
TZAT-0020ATACH: 1.5 ms
TZAT-0020ATACH: 1.5 ms
TZAT-0020FASTVT: 1.5 ms
TZAT-0020SLOWVT: 1.5 ms
TZAT-0020SLOWVT: 1.5 ms
TZON-0003ATACH: 350 ms
TZON-0003SLOWVT: 350 ms
TZON-0003VSLOWVT: 400 ms
TZON-0004SLOWVT: 16
TZON-0004VSLOWVT: 20
TZON-0005SLOWVT: 12
TZST-0001ATACH: 4
TZST-0001ATACH: 5
TZST-0001ATACH: 6
TZST-0001FASTVT: 2
TZST-0001FASTVT: 3
TZST-0001FASTVT: 4
TZST-0001FASTVT: 5
TZST-0001FASTVT: 6
TZST-0001SLOWVT: 3
TZST-0001SLOWVT: 4
TZST-0001SLOWVT: 5
TZST-0001SLOWVT: 6
TZST-0002ATACH: NEGATIVE
TZST-0002ATACH: NEGATIVE
TZST-0002ATACH: NEGATIVE
TZST-0002FASTVT: NEGATIVE
TZST-0002FASTVT: NEGATIVE
TZST-0002FASTVT: NEGATIVE
TZST-0002FASTVT: NEGATIVE
TZST-0002FASTVT: NEGATIVE
TZST-0003SLOWVT: 25 J
TZST-0003SLOWVT: 35 J
TZST-0003SLOWVT: 35 J
TZST-0003SLOWVT: 35 J
VENTRICULAR PACING ICD: 96 pct
VF: 0

## 2011-10-17 LAB — POCT INR: INR: 2.8

## 2011-10-17 NOTE — Patient Instructions (Addendum)
Continue 2.5 mg daily, recheck 4 week

## 2011-10-17 NOTE — Progress Notes (Signed)
ICD check with ICM 

## 2011-11-11 ENCOUNTER — Ambulatory Visit: Payer: Medicare Other | Admitting: Cardiovascular Disease

## 2011-11-13 ENCOUNTER — Ambulatory Visit (INDEPENDENT_AMBULATORY_CARE_PROVIDER_SITE_OTHER): Payer: Medicare Other | Admitting: Family Medicine

## 2011-11-13 DIAGNOSIS — I4891 Unspecified atrial fibrillation: Secondary | ICD-10-CM

## 2011-11-13 DIAGNOSIS — Z7901 Long term (current) use of anticoagulants: Secondary | ICD-10-CM

## 2011-11-13 DIAGNOSIS — Z5181 Encounter for therapeutic drug level monitoring: Secondary | ICD-10-CM

## 2011-11-13 LAB — POCT INR: INR: 2.9

## 2011-11-13 NOTE — Patient Instructions (Signed)
Continue current dose, check in 4 weeks  

## 2011-11-14 ENCOUNTER — Ambulatory Visit: Payer: Medicare Other

## 2011-11-26 ENCOUNTER — Ambulatory Visit (INDEPENDENT_AMBULATORY_CARE_PROVIDER_SITE_OTHER): Payer: Medicare Other | Admitting: Cardiovascular Disease

## 2011-11-26 ENCOUNTER — Encounter: Payer: Self-pay | Admitting: Cardiovascular Disease

## 2011-11-26 VITALS — BP 140/70 | HR 76 | Ht 67.0 in | Wt 119.5 lb

## 2011-11-26 DIAGNOSIS — I4891 Unspecified atrial fibrillation: Secondary | ICD-10-CM

## 2011-11-26 DIAGNOSIS — I10 Essential (primary) hypertension: Secondary | ICD-10-CM

## 2011-11-26 DIAGNOSIS — I428 Other cardiomyopathies: Secondary | ICD-10-CM

## 2011-11-26 DIAGNOSIS — I429 Cardiomyopathy, unspecified: Secondary | ICD-10-CM

## 2011-11-26 DIAGNOSIS — R42 Dizziness and giddiness: Secondary | ICD-10-CM | POA: Insufficient documentation

## 2011-11-26 DIAGNOSIS — I5042 Chronic combined systolic (congestive) and diastolic (congestive) heart failure: Secondary | ICD-10-CM

## 2011-11-26 DIAGNOSIS — I6529 Occlusion and stenosis of unspecified carotid artery: Secondary | ICD-10-CM

## 2011-11-26 NOTE — Assessment & Plan Note (Signed)
She appears to be relatively euvolemic on today's visit. Basic metabolic panel pending to evaluate electrolytes and renal function, given her dizziness symptoms.

## 2011-11-26 NOTE — Assessment & Plan Note (Signed)
Blood pressure is well controlled on today's visit. No changes made to the medications. 

## 2011-11-26 NOTE — Assessment & Plan Note (Signed)
We have suggested she continue her Lipitor 40 mg daily, goal LDL less than 70

## 2011-11-26 NOTE — Progress Notes (Signed)
Patient ID: [name], female    DOB: [birth year], 78 y.o.   MRN: [id number]  HPI Comments: [name] is a delightful 78 year old woman with a history of hypertrophic cardiomyopathy complicated by syncope, COPD, as well as symptomatic bradycardia.  She is status post ICD.  She also has a history of nonobstructive coronary artery disease with chronic chest pain, bilateral carotid artery stenosis, paroxysmal atrial fibrillation, renal cell carcinoma status post resection of her right kidney with residual chronic renal insufficiency, and COPD.     right hip replacement on May 03 2011.  She reports that she has had dizziness over the past 3 weeks. It happens while she is sitting or she moves her head. It has been constant. She does report having ear discomfort several weeks ago, possible sinus problems. No worsening symptoms with lying down and standing up or other change in position. Her medications have not changed recently. She denies any orthostatic type symptoms. She describes it as a spinning, also with dizziness.   Rarely she has very short episodes of near syncope.  She reports having rare palpitations at nighttime.  stress test in March 2011.  Echo EF 55-60% no LVOT gradient.  No CP or SOB.  Fatigued at times.     Last carotid u/s  6/10; 40-59% R 60-79%. No neuro symptoms.     Cardiac cath 2006    --non-obstructive CAD 30-40s lesions   --ETT 1/09 nondiagnostic due to poor HR response  EKG shows AV paced rhythm, rate in the 60s     Outpatient Encounter Prescriptions as of 11/26/2011  Medication Sig Dispense Refill  . aspirin 81 MG EC tablet Take 81 mg by mouth daily after breakfast.       . atorvastatin (LIPITOR) 40 MG tablet Take 1 tablet (40 mg total) by mouth every evening.  30 tablet  6  . carvedilol (COREG) 12.5 MG tablet Take 1 tablet (12.5 mg total) by mouth 2 (two) times daily with a meal.  60 tablet  3  . cetirizine (ZYRTEC) 10 MG tablet Take 10 mg by mouth  daily.       . disopyramide (NORPACE) 150 MG capsule Take 1 capsule (150 mg total) by mouth 2 (two) times daily.  60 capsule  6  . docusate sodium (COLACE) 250 MG capsule Take 250 mg by mouth daily.      . furosemide (LASIX) 40 MG tablet Take 1 tablet (40 mg total) by mouth daily.  30 tablet  4  . potassium chloride (K-DUR) 10 MEQ tablet Take 1 tablet (10 mEq total) by mouth 2 (two) times daily.  60 tablet  3  . verapamil (CALAN-SR) 180 MG CR tablet Take 180 mg by mouth at bedtime.       Marland Kitchen warfarin (COUMADIN) 5 MG tablet Take 2.5 mg by mouth daily.         Review of Systems  HENT: Negative.   Eyes: Negative.   Respiratory: Negative.   Gastrointestinal: Negative.   Skin: Negative.   Neurological: Positive for dizziness.  Hematological: Negative.   Psychiatric/Behavioral: Negative.   All other systems reviewed and are negative.    BP 140/70  Pulse 76  Ht 5\' 7"  (1.702 m)  Wt 119 lb 8 oz (54.205 kg)  BMI 18.72 kg/m2  Physical Exam  Nursing note and vitals reviewed. Constitutional: She is oriented to person, place, and time. She appears well-developed and well-nourished.  HENT:  Head: Normocephalic.  Nose: Nose normal.  Mouth/Throat:  Oropharynx is clear and moist.  Eyes: Conjunctivae are normal. Pupils are equal, round, and reactive to light.  Neck: Normal range of motion. Neck supple. No JVD present. Carotid bruit is present.  Cardiovascular: Normal rate, regular rhythm, S1 normal, S2 normal and intact distal pulses.  Exam reveals no gallop and no friction rub.   Murmur heard.  Crescendo systolic murmur is present with a grade of 2/6  Pulmonary/Chest: Effort normal and breath sounds normal. No respiratory distress. She has no wheezes. She has no rales. She exhibits no tenderness.  Abdominal: Soft. Bowel sounds are normal. She exhibits no distension. There is no tenderness.  Musculoskeletal: Normal range of motion. She exhibits no edema and no tenderness.  Lymphadenopathy:     She has no cervical adenopathy.  Neurological: She is alert and oriented to person, place, and time. Coordination normal.  Skin: Skin is warm and dry. No rash noted. No erythema.  Psychiatric: She has a normal mood and affect. Her behavior is normal. Judgment and thought content normal.         Assessment and Plan

## 2011-11-26 NOTE — Patient Instructions (Addendum)
You are doing well. Stop the lipitor for two weeks Call the office to let us know how the leg pain is going  Try meclizine or motion sickness tables for dizziness APPT with ENT is on: Thursday 11/28/11 at 9:45 am.  Call them at [phone] if you need to reschedule.  They ask that you call their billing department prior to your appointment at [phone].  Their office is located on the [facility].  Please take extra coreg at night for palpitations  We will check you kidney function today  Please call us if you have new issues that need to be addressed before your next appt.  Your physician wants you to follow-up in: 6 months.  You will receive a reminder letter in the mail two months in advance. If you don't receive a letter, please call our office to schedule the follow-up appointment.

## 2011-11-26 NOTE — Assessment & Plan Note (Signed)
Notes indicate predominantly normal sinus rhythm on last pacemaker check. Uncertain if her palpitations in the evening is from arrhythmia. We have suggested she could try extra dose of carvedilol at night if needed for symptom relief.

## 2011-11-26 NOTE — Assessment & Plan Note (Addendum)
She does not appear to have orthostatic type symptoms. They occur at rest while sitting and in fact she reports having spinning and dizziness while sitting on the exam table. Blood pressure stable, rhythm is normal. No positional component to her symptoms. She is worried about ear congestion. We will make an appointment for her with ear nose throat. We'll check basic metabolic panel today to exclude dehydration.

## 2011-11-27 LAB — BASIC METABOLIC PANEL
BUN/Creatinine Ratio: 10 — ABNORMAL LOW (ref 11–26)
BUN: 15 mg/dL (ref 8–27)
CO2: 27 mmol/L (ref 19–28)
Calcium: 9.6 mg/dL (ref 8.6–10.2)
Chloride: 100 mmol/L (ref 97–108)
Creatinine, Ser: 1.47 mg/dL — ABNORMAL HIGH (ref 0.57–1.00)
GFR calc Af Amer: 40 mL/min/{1.73_m2} — ABNORMAL LOW (ref >59)
GFR calc non Af Amer: 35 mL/min/{1.73_m2} — ABNORMAL LOW (ref >59)
Glucose: 94 mg/dL (ref 65–99)
Potassium: 4.5 mmol/L (ref 3.5–5.2)
Sodium: 142 mmol/L (ref 134–144)

## 2011-12-11 ENCOUNTER — Ambulatory Visit (INDEPENDENT_AMBULATORY_CARE_PROVIDER_SITE_OTHER): Payer: Medicare Other | Admitting: Family Medicine

## 2011-12-11 DIAGNOSIS — Z5181 Encounter for therapeutic drug level monitoring: Secondary | ICD-10-CM

## 2011-12-11 DIAGNOSIS — Z7901 Long term (current) use of anticoagulants: Secondary | ICD-10-CM

## 2011-12-11 DIAGNOSIS — I4891 Unspecified atrial fibrillation: Secondary | ICD-10-CM

## 2011-12-11 LAB — POCT INR: INR: 3.1

## 2011-12-11 NOTE — Patient Instructions (Signed)
2.5 mg daily, recheck 4 week

## 2011-12-17 ENCOUNTER — Other Ambulatory Visit: Payer: Self-pay | Admitting: *Deleted

## 2011-12-17 MED ORDER — CARVEDILOL 12.5 MG PO TABS: 12.5000 mg | ORAL_TABLET | Freq: Two times a day (BID) | ORAL | Status: DC

## 2011-12-17 NOTE — Telephone Encounter (Signed)
Refilled Carvedilol. 

## 2011-12-25 ENCOUNTER — Other Ambulatory Visit: Payer: Self-pay | Admitting: *Deleted

## 2011-12-25 MED ORDER — WARFARIN SODIUM 5 MG PO TABS: 2.5000 mg | ORAL_TABLET | Freq: Every day | ORAL | Status: DC

## 2012-01-01 ENCOUNTER — Encounter: Payer: Self-pay | Admitting: Family Medicine

## 2012-01-01 ENCOUNTER — Ambulatory Visit (INDEPENDENT_AMBULATORY_CARE_PROVIDER_SITE_OTHER): Payer: Medicare Other | Admitting: Family Medicine

## 2012-01-01 VITALS — BP 142/86 | HR 72 | Temp 97.9°F | Wt 186.0 lb

## 2012-01-01 DIAGNOSIS — R300 Dysuria: Secondary | ICD-10-CM

## 2012-01-01 LAB — POCT URINALYSIS DIPSTICK
Bilirubin, UA: NEGATIVE
Glucose, UA: NEGATIVE
Ketones, UA: NEGATIVE
Nitrite, UA: NEGATIVE
Spec Grav, UA: 1.01
Urobilinogen, UA: NEGATIVE
pH, UA: 7

## 2012-01-01 MED ORDER — CEPHALEXIN 500 MG PO CAPS: 500.0000 mg | ORAL_CAPSULE | Freq: Two times a day (BID) | ORAL | Status: DC

## 2012-01-01 NOTE — Patient Instructions (Addendum)
Good to see you. Your urine does look positive for a UTI. Please take antibiotic as directed. You can take AZO for pain.

## 2012-01-01 NOTE — Progress Notes (Signed)
SUBJECTIVE: [name] is a 78 y.o. female who complains of urinary frequency, urgency and dysuria x 4 days, without flank pain, fever, chills, or abnormal vaginal discharge or bleeding.   H/o recurrent UTI.  Last cx neg in May. Urine cx from 06/2011- E.coli, resistant to cipro.  PMH significant for sulfa allergy.  Patient Active Problem List  Diagnosis  . CARCINOMA, RENAL CELL  . HYPERLIPIDEMIA  . OTHER AND UNSPECIFIED COAGULATION DEFECTS  . ADJUSTMENT DISORDER WITH ANXIETY  . HOCM / IHSS  . CARDIOMYOPATHY, SECONDARY  . ATRIAL FIBRILLATION  . SINUS BRADYCARDIA  . CAROTID ARTERY STENOSIS  . OTHER ACUTE SINUSITIS  . COPD  . RECTAL BLEEDING  . BLOOD IN STOOL  . RENAL INSUFFICIENCY, CHRONIC  . FATIGUE  . EDEMA  . CHEST PAIN-UNSPECIFIED  . Dysuria  . INCONTINENCE, URGE  . ICD - IN SITU  . Cough  . Avascular necrosis of hip  . Hypertension  . Sinusitis acute  . Chronic combined systolic and diastolic heart failure  . Dizziness   Past Medical History  Diagnosis Date  . Carotid artery stenosis 09/2007    60-79% bilateral (stable)  . Carotid artery stenosis 10/2008    40-59% R 60-79%   . Chest pain, unspecified   . Automatic implantable cardiac defibrillator in situ   . Atrial fibrillation   . Sinus bradycardia   . Hypotension, unspecified     cardiac cath 2006..nonobstructive CAD 30-40s lesions.Marland KitchenETT 1/09 nondiagnostic due to poor HR response..Right Renal Cancer 2003  . History of hysterectomy 1975    FOR BENIGN CAUSES  . Adjustment disorder with anxiety   . Blood in stool   . Secondary cardiomyopathy, unspecified   . Chronic airway obstruction, not elsewhere classified   . Encounter for long-term (current) use of anticoagulants   . Dysuria   . Edema   . Encounter for therapeutic drug monitoring   . Other malaise and fatigue   . Hypertrophic obstructive cardiomyopathy   . Other and unspecified hyperlipidemia   . Urge incontinence   . Other acute sinusitis     . Other and unspecified coagulation defects   . Other screening mammogram   . Pre-operative cardiovascular examination   . Hemorrhage of rectum and anus   . Chronic kidney disease, unspecified   . Special screening for osteoporosis   . Renal cancer 05-01-11    2'03-Right  . Malignant neoplasm of kidney, except pelvis   . Shortness of breath 05-01-11    exertional SOB only  . Arthritis 05-01-11    Right hip osteoarthritis  . Hypertension 05/20/2011   Past Surgical History  Procedure Date  . Abdominal hysterectomy 1975    for benign causes  . Cardiac catheterization 2006  . Nephrectomy     2'03-RIGHT S/P RENAL CELL CANCER  . Cataract extraction 05-01-11    bil. with lens implant  . Insert / replace / remove pacemaker 05-01-11    02-28-05-/05-04-10-ICD-MEDTRONIC MAXIMAL DR  . Eye surgery 05-01-11    bil. cataract only  . Appendectomy   . Total hip arthroplasty 05/03/2011    Procedure: TOTAL HIP ARTHROPLASTY ANTERIOR APPROACH;  Surgeon: Mcarthur Rossetti;  Location: [facility];  Service: Orthopedics;  Laterality: Right;  Removal of Cannulated Screws Right Hip, Right Direct Anterior Hip Replacement   History  Substance Use Topics  . Smoking status: Former Smoker -- 0.5 packs/day for 40 years    Types: Cigarettes    Quit date: 05/06/2001  . Smokeless tobacco:  Not on file  . Alcohol Use: No   Family History  Problem Relation Age of Onset  . Colon cancer Neg Hx    Allergies  Allergen Reactions  . Codeine     REACTION: N \\T \ V  . Sulfonamide Derivatives     REACTION: Dry mouth   Current Outpatient Prescriptions on File Prior to Visit  Medication Sig Dispense Refill  . aspirin 81 MG EC tablet Take 81 mg by mouth daily after breakfast.       . atorvastatin (LIPITOR) 40 MG tablet Take 1 tablet (40 mg total) by mouth every evening.  30 tablet  6  . carvedilol (COREG) 12.5 MG tablet Take 1 tablet (12.5 mg total) by mouth 2 (two) times daily with a meal.  60 tablet  5  .  cetirizine (ZYRTEC) 10 MG tablet Take 10 mg by mouth daily.       . disopyramide (NORPACE) 150 MG capsule Take 1 capsule (150 mg total) by mouth 2 (two) times daily.  60 capsule  6  . docusate sodium (COLACE) 250 MG capsule Take 250 mg by mouth daily.      . furosemide (LASIX) 40 MG tablet Take 1 tablet (40 mg total) by mouth daily.  30 tablet  4  . potassium chloride (K-DUR) 10 MEQ tablet Take 1 tablet (10 mEq total) by mouth 2 (two) times daily.  60 tablet  3  . verapamil (CALAN-SR) 180 MG CR tablet Take 180 mg by mouth at bedtime.       Marland Kitchen warfarin (COUMADIN) 5 MG tablet Take 0.5 tablets (2.5 mg total) by mouth daily.  30 tablet  5  . DISCONTD: hydrochlorothiazide (HYDRODIURIL) 25 MG tablet Take 25 mg by mouth daily after breakfast.       . DISCONTD: potassium chloride SA (KLOR-CON M20) 20 MEQ tablet Take 20 mEq by mouth daily.        The PMH, PSH, Social History, Family History, Medications, and allergies have been reviewed in [facility], and have been updated if relevant.   OBJECTIVE:  BP 142/86  Pulse 72  Temp 97.9 F (36.6 C)  Wt 186 lb (84.369 kg)  Appears well, in no apparent distress.  Vital signs are normal. The abdomen is soft without tenderness, guarding, mass, rebound or organomegaly. No CVA tenderness or inguinal adenopathy noted. Urine dipstick shows positive for RBC's and positive for leukocytes.    ASSESSMENT: UTI without evidence of pyelonephritis  PLAN: Treatment per orders - keflex 500 mg twice daily x 7 days, also push fluids, may use Pyridium OTC prn. Call or return to clinic prn if these symptoms worse or fail to improve as anticipated.

## 2012-01-03 LAB — URINE CULTURE: Colony Count: 35000

## 2012-01-08 ENCOUNTER — Ambulatory Visit: Payer: Medicare Other

## 2012-01-09 ENCOUNTER — Ambulatory Visit (INDEPENDENT_AMBULATORY_CARE_PROVIDER_SITE_OTHER): Payer: Medicare Other | Admitting: Family Medicine

## 2012-01-09 DIAGNOSIS — Z5181 Encounter for therapeutic drug level monitoring: Secondary | ICD-10-CM

## 2012-01-09 DIAGNOSIS — I4891 Unspecified atrial fibrillation: Secondary | ICD-10-CM

## 2012-01-09 DIAGNOSIS — Z7901 Long term (current) use of anticoagulants: Secondary | ICD-10-CM

## 2012-01-09 LAB — POCT INR: INR: 3

## 2012-01-09 NOTE — Patient Instructions (Signed)
Continue current dose, check in 4 weeks  

## 2012-01-21 ENCOUNTER — Encounter: Payer: Self-pay | Admitting: Internal Medicine

## 2012-01-21 ENCOUNTER — Encounter: Payer: Self-pay | Admitting: *Deleted

## 2012-01-21 ENCOUNTER — Ambulatory Visit (INDEPENDENT_AMBULATORY_CARE_PROVIDER_SITE_OTHER): Payer: Medicare Other | Admitting: Internal Medicine

## 2012-01-21 VITALS — BP 120/62 | HR 67 | Ht 67.0 in | Wt 183.0 lb

## 2012-01-21 DIAGNOSIS — I5042 Chronic combined systolic (congestive) and diastolic (congestive) heart failure: Secondary | ICD-10-CM

## 2012-01-21 DIAGNOSIS — I421 Obstructive hypertrophic cardiomyopathy: Secondary | ICD-10-CM

## 2012-01-21 DIAGNOSIS — I739 Peripheral vascular disease, unspecified: Secondary | ICD-10-CM

## 2012-01-21 LAB — ICD DEVICE OBSERVATION
AL AMPLITUDE: 1.25 mv
AL IMPEDENCE ICD: 456 Ohm
AL THRESHOLD: 1.125 V
ATRIAL PACING ICD: 94.27 pct
BAMS-0001: 170 {beats}/min
BATTERY VOLTAGE: 3.1321 V
CHARGE TIME: 8.958 s
DEV-0020ICD: NEGATIVE
FVT: 0
PACEART VT: 0
RV LEAD AMPLITUDE: 10.75 mv
RV LEAD IMPEDENCE ICD: 532 Ohm
RV LEAD THRESHOLD: 1 V
TOT-0001: 1
TOT-0002: 0
TOT-0006: 20111230000000
TZAT-0001ATACH: 1
TZAT-0001ATACH: 2
TZAT-0001ATACH: 3
TZAT-0001FASTVT: 1
TZAT-0001SLOWVT: 1
TZAT-0001SLOWVT: 2
TZAT-0002ATACH: NEGATIVE
TZAT-0002ATACH: NEGATIVE
TZAT-0002ATACH: NEGATIVE
TZAT-0002FASTVT: NEGATIVE
TZAT-0004SLOWVT: 8
TZAT-0004SLOWVT: 8
TZAT-0005SLOWVT: 84 pct
TZAT-0005SLOWVT: 91 pct
TZAT-0011SLOWVT: 10 ms
TZAT-0011SLOWVT: 10 ms
TZAT-0012ATACH: 150 ms
TZAT-0012ATACH: 150 ms
TZAT-0012ATACH: 150 ms
TZAT-0012FASTVT: 170 ms
TZAT-0012SLOWVT: 170 ms
TZAT-0012SLOWVT: 170 ms
TZAT-0013SLOWVT: 2
TZAT-0013SLOWVT: 2
TZAT-0018ATACH: NEGATIVE
TZAT-0018ATACH: NEGATIVE
TZAT-0018ATACH: NEGATIVE
TZAT-0018FASTVT: NEGATIVE
TZAT-0018SLOWVT: NEGATIVE
TZAT-0018SLOWVT: NEGATIVE
TZAT-0019ATACH: 6 V
TZAT-0019ATACH: 6 V
TZAT-0019ATACH: 6 V
TZAT-0019FASTVT: 8 V
TZAT-0019SLOWVT: 8 V
TZAT-0019SLOWVT: 8 V
TZAT-0020ATACH: 1.5 ms
TZAT-0020ATACH: 1.5 ms
TZAT-0020ATACH: 1.5 ms
TZAT-0020FASTVT: 1.5 ms
TZAT-0020SLOWVT: 1.5 ms
TZAT-0020SLOWVT: 1.5 ms
TZON-0003ATACH: 350 ms
TZON-0003SLOWVT: 350 ms
TZON-0003VSLOWVT: 400 ms
TZON-0004SLOWVT: 16
TZON-0004VSLOWVT: 20
TZON-0005SLOWVT: 12
TZST-0001ATACH: 4
TZST-0001ATACH: 5
TZST-0001ATACH: 6
TZST-0001FASTVT: 2
TZST-0001FASTVT: 3
TZST-0001FASTVT: 4
TZST-0001FASTVT: 5
TZST-0001FASTVT: 6
TZST-0001SLOWVT: 3
TZST-0001SLOWVT: 4
TZST-0001SLOWVT: 5
TZST-0001SLOWVT: 6
TZST-0002ATACH: NEGATIVE
TZST-0002ATACH: NEGATIVE
TZST-0002ATACH: NEGATIVE
TZST-0002FASTVT: NEGATIVE
TZST-0002FASTVT: NEGATIVE
TZST-0002FASTVT: NEGATIVE
TZST-0002FASTVT: NEGATIVE
TZST-0002FASTVT: NEGATIVE
TZST-0003SLOWVT: 25 J
TZST-0003SLOWVT: 35 J
TZST-0003SLOWVT: 35 J
TZST-0003SLOWVT: 35 J
VENTRICULAR PACING ICD: 97.5 pct
VF: 0

## 2012-01-21 NOTE — Assessment & Plan Note (Signed)
She is mostly maintaining sinus rhythm. I cannot detect an increase in the atrial fibrillation burden. I considered increasing her antiarrhythmic drug therapy, but decided against it at this time.

## 2012-01-21 NOTE — Assessment & Plan Note (Signed)
The etiology of her symptoms is unclear. She has carotid vascular disease and I am concerned that she has peripheral vascular disease. We'll obtain duplex Dopplers of her lower extremities.

## 2012-01-21 NOTE — Progress Notes (Signed)
HPI [name] returns today for followup. She is a very pleasant 78 year old woman with hypertrophic cardiomyopathy, atrial fibrillation, ventricular tachycardia, status post ICD implantation. The patient complains of fatigue and weakness. Her energy level is down. She notes that when she walks, she is pain in her legs which gets better with stopping and resting. She has not had syncope. She has minimal palpitations. She has been less active and frustrated by her inability to lose weight. Allergies  Allergen Reactions  . Codeine     REACTION: N \\T \ V  . Sulfonamide Derivatives     REACTION: Dry mouth     Current Outpatient Prescriptions  Medication Sig Dispense Refill  . aspirin 81 MG EC tablet Take 81 mg by mouth daily after breakfast.       . atorvastatin (LIPITOR) 40 MG tablet Take 1 tablet (40 mg total) by mouth every evening.  30 tablet  6  . carvedilol (COREG) 12.5 MG tablet Take 1 tablet (12.5 mg total) by mouth 2 (two) times daily with a meal.  60 tablet  5  . cetirizine (ZYRTEC) 10 MG tablet Take 10 mg by mouth daily.       . disopyramide (NORPACE) 150 MG capsule Take 1 capsule (150 mg total) by mouth 2 (two) times daily.  60 capsule  6  . furosemide (LASIX) 40 MG tablet Take 1 tablet (40 mg total) by mouth daily.  30 tablet  4  . potassium chloride (K-DUR) 10 MEQ tablet Take 1 tablet (10 mEq total) by mouth 2 (two) times daily.  60 tablet  3  . verapamil (CALAN-SR) 180 MG CR tablet Take 180 mg by mouth at bedtime.       Marland Kitchen warfarin (COUMADIN) 5 MG tablet Take 0.5 tablets (2.5 mg total) by mouth daily.  30 tablet  5  . DISCONTD: hydrochlorothiazide (HYDRODIURIL) 25 MG tablet Take 25 mg by mouth daily after breakfast.       . DISCONTD: potassium chloride SA (KLOR-CON M20) 20 MEQ tablet Take 20 mEq by mouth daily.          Past Medical History  Diagnosis Date  . Carotid artery stenosis 09/2007    60-79% bilateral (stable)  . Carotid artery stenosis 10/2008    40-59% R 60-79%     . Chest pain, unspecified   . Automatic implantable cardiac defibrillator in situ   . Atrial fibrillation   . Sinus bradycardia   . Hypotension, unspecified     cardiac cath 2006..nonobstructive CAD 30-40s lesions.Marland KitchenETT 1/09 nondiagnostic due to poor HR response..Right Renal Cancer 2003  . History of hysterectomy 1975    FOR BENIGN CAUSES  . Adjustment disorder with anxiety   . Blood in stool   . Secondary cardiomyopathy, unspecified   . Chronic airway obstruction, not elsewhere classified   . Encounter for long-term (current) use of anticoagulants   . Dysuria   . Edema   . Encounter for therapeutic drug monitoring   . Other malaise and fatigue   . Hypertrophic obstructive cardiomyopathy   . Other and unspecified hyperlipidemia   . Urge incontinence   . Other acute sinusitis   . Other and unspecified coagulation defects   . Other screening mammogram   . Pre-operative cardiovascular examination   . Hemorrhage of rectum and anus   . Chronic kidney disease, unspecified   . Special screening for osteoporosis   . Renal cancer 05-01-11    2'03-Right  . Malignant neoplasm of kidney, except pelvis   .  Shortness of breath 05-01-11    exertional SOB only  . Arthritis 05-01-11    Right hip osteoarthritis  . Hypertension 05/20/2011    ROS:   All systems reviewed and negative except as noted in the HPI.   Past Surgical History  Procedure Date  . Abdominal hysterectomy 1975    for benign causes  . Cardiac catheterization 2006  . Nephrectomy     2'03-RIGHT S/P RENAL CELL CANCER  . Cataract extraction 05-01-11    bil. with lens implant  . Insert / replace / remove pacemaker 05-01-11    02-28-05-/05-04-10-ICD-MEDTRONIC MAXIMAL DR  . Eye surgery 05-01-11    bil. cataract only  . Appendectomy   . Total hip arthroplasty 05/03/2011    Procedure: TOTAL HIP ARTHROPLASTY ANTERIOR APPROACH;  Surgeon: Mcarthur Rossetti;  Location: [facility];  Service: Orthopedics;  Laterality: Right;   Removal of Cannulated Screws Right Hip, Right Direct Anterior Hip Replacement     Family History  Problem Relation Age of Onset  . Colon cancer Neg Hx      History   Social History  . Marital Status: Widowed    Spouse Name: N/A    Number of Children: N/A  . Years of Education: N/A   Occupational History  . Retired    Social History Main Topics  . Smoking status: Former Smoker -- 0.5 packs/day for 40 years    Types: Cigarettes    Quit date: 05/06/2001  . Smokeless tobacco: Not on file  . Alcohol Use: No  . Drug Use: No  . Sexually Active: No   Other Topics Concern  . 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 HEART:  Regular rate rhythm, grade 1/6 systolic murmur, no rubs, no clicks Abd:  soft, positive bowel sounds, no organomegally, no rebound, no guarding Ext:  1+ plus pulses, no edema, no cyanosis, no clubbing Skin:  No rashes no nodules Neuro:  CN II through XII intact, motor grossly intact  DEVICE  Normal device function.  See PaceArt for details.   Assess/Plan:

## 2012-01-21 NOTE — Assessment & Plan Note (Signed)
Her symptoms are class II. She will continue her current medical therapy and maintain a low-sodium diet. I would not change her dose of diuretic at this time.

## 2012-01-21 NOTE — Assessment & Plan Note (Signed)
Her Medtronic defibrillator is working normally. We'll plan to recheck in several months.

## 2012-01-21 NOTE — Patient Instructions (Addendum)
Your physician has requested that you have a lower extremity arterial exercise duplex. During this test, exercise and ultrasound are used to evaluate arterial blood flow in the legs. Allow one hour for this exam. There are no restrictions or special instructions.  Your physician recommends that you return for lab work in:  At Dr Hulen Shouts office (TSH, ESR, CBCw/diff, BMET and CPK)  Your physician recommends that you schedule a follow-up appointment in: 3 months with the device clinic Nevin Bloodgood)  Your physician wants you to follow-up in: 1 year.   You will receive a reminder letter in the mail two months in advance. If you don't receive a letter, please call our office to schedule the follow-up appointment.  Your physician recommends that you continue on your current medications as directed. Please refer to the Current Medication list given to you today.

## 2012-01-21 NOTE — Assessment & Plan Note (Signed)
The etiology of her fatigue is unclear. I've asked the patient to have her thyroid checked as well as CBC and a BMP and a sedimentation rate.

## 2012-01-24 ENCOUNTER — Other Ambulatory Visit: Payer: Self-pay | Admitting: Internal Medicine

## 2012-01-24 MED ORDER — DISOPYRAMIDE PHOSPHATE 150 MG PO CAPS: 150.0000 mg | ORAL_CAPSULE | Freq: Two times a day (BID) | ORAL | Status: DC

## 2012-01-29 ENCOUNTER — Other Ambulatory Visit: Payer: Self-pay | Admitting: Cardiology

## 2012-01-29 DIAGNOSIS — I739 Peripheral vascular disease, unspecified: Secondary | ICD-10-CM

## 2012-02-05 ENCOUNTER — Ambulatory Visit: Payer: Medicare Other

## 2012-02-06 ENCOUNTER — Encounter (INDEPENDENT_AMBULATORY_CARE_PROVIDER_SITE_OTHER): Payer: Medicare Other

## 2012-02-06 DIAGNOSIS — I739 Peripheral vascular disease, unspecified: Secondary | ICD-10-CM

## 2012-02-06 DIAGNOSIS — I70219 Atherosclerosis of native arteries of extremities with intermittent claudication, unspecified extremity: Secondary | ICD-10-CM

## 2012-02-10 ENCOUNTER — Ambulatory Visit (INDEPENDENT_AMBULATORY_CARE_PROVIDER_SITE_OTHER): Payer: Medicare Other | Admitting: Family Medicine

## 2012-02-10 DIAGNOSIS — I4891 Unspecified atrial fibrillation: Secondary | ICD-10-CM

## 2012-02-10 DIAGNOSIS — Z5181 Encounter for therapeutic drug level monitoring: Secondary | ICD-10-CM

## 2012-02-10 DIAGNOSIS — Z7901 Long term (current) use of anticoagulants: Secondary | ICD-10-CM

## 2012-02-10 LAB — POCT INR: INR: 6.9

## 2012-02-10 NOTE — Patient Instructions (Signed)
Hold x 2 days and check , 02-12-12

## 2012-02-11 LAB — CBC WITH DIFFERENTIAL/PLATELET
Basophils Absolute: 0 10*3/uL (ref 0.0–0.2)
Basos: 1 % (ref 0–3)
Eos: 4 % (ref 0–5)
Eosinophils Absolute: 0.2 10*3/uL (ref 0.0–0.4)
HCT: 37.7 % (ref 34.0–46.6)
Hemoglobin: 12.4 g/dL (ref 11.1–15.9)
Immature Grans (Abs): 0 10*3/uL (ref 0.0–0.1)
Immature Granulocytes: 0 % (ref 0–2)
Lymphocytes Absolute: 1.4 10*3/uL (ref 0.7–3.1)
Lymphs: 32 % (ref 14–46)
MCH: 29.2 pg (ref 26.6–33.0)
MCHC: 32.9 g/dL (ref 31.5–35.7)
MCV: 89 fL (ref 79–97)
Monocytes Absolute: 0.5 10*3/uL (ref 0.1–0.9)
Monocytes: 12 % (ref 4–12)
Neutrophils Absolute: 2.2 10*3/uL (ref 1.4–7.0)
Neutrophils Relative %: 51 % (ref 40–74)
RBC: 4.24 x10E6/uL (ref 3.77–5.28)
RDW: 15.3 % (ref 12.3–15.4)
WBC: 4.3 10*3/uL (ref 3.4–10.8)

## 2012-02-11 LAB — BASIC METABOLIC PANEL
BUN/Creatinine Ratio: 15 (ref 11–26)
BUN: 22 mg/dL (ref 8–27)
CO2: 24 mmol/L (ref 19–28)
Calcium: 9 mg/dL (ref 8.6–10.2)
Chloride: 100 mmol/L (ref 97–108)
Creatinine, Ser: 1.47 mg/dL — ABNORMAL HIGH (ref 0.57–1.00)
GFR calc Af Amer: 40 mL/min/{1.73_m2} — ABNORMAL LOW (ref >59)
GFR calc non Af Amer: 35 mL/min/{1.73_m2} — ABNORMAL LOW (ref >59)
Glucose: 93 mg/dL (ref 65–99)
Potassium: 3.7 mmol/L (ref 3.5–5.2)
Sodium: 138 mmol/L (ref 134–144)

## 2012-02-11 LAB — TSH: TSH: 1.89 u[IU]/mL (ref 0.450–4.500)

## 2012-02-11 LAB — CK: Total CK: 114 U/L (ref 24–173)

## 2012-02-11 LAB — SEDIMENTATION RATE: Sed Rate: 2 mm/hr (ref 0–40)

## 2012-02-12 ENCOUNTER — Ambulatory Visit: Payer: Medicare Other

## 2012-02-12 ENCOUNTER — Ambulatory Visit (INDEPENDENT_AMBULATORY_CARE_PROVIDER_SITE_OTHER): Payer: Medicare Other | Admitting: Family Medicine

## 2012-02-12 DIAGNOSIS — Z5181 Encounter for therapeutic drug level monitoring: Secondary | ICD-10-CM

## 2012-02-12 DIAGNOSIS — Z7901 Long term (current) use of anticoagulants: Secondary | ICD-10-CM

## 2012-02-12 DIAGNOSIS — I4891 Unspecified atrial fibrillation: Secondary | ICD-10-CM

## 2012-02-12 LAB — POCT INR: INR: 4.1

## 2012-02-12 NOTE — Patient Instructions (Signed)
Hold 2 doses then recheck Friday,

## 2012-02-14 ENCOUNTER — Ambulatory Visit (INDEPENDENT_AMBULATORY_CARE_PROVIDER_SITE_OTHER): Payer: Medicare Other

## 2012-02-14 ENCOUNTER — Other Ambulatory Visit: Payer: Self-pay | Admitting: *Deleted

## 2012-02-14 ENCOUNTER — Ambulatory Visit (INDEPENDENT_AMBULATORY_CARE_PROVIDER_SITE_OTHER): Payer: Medicare Other | Admitting: Family Medicine

## 2012-02-14 ENCOUNTER — Ambulatory Visit: Payer: Medicare Other

## 2012-02-14 DIAGNOSIS — Z5181 Encounter for therapeutic drug level monitoring: Secondary | ICD-10-CM

## 2012-02-14 DIAGNOSIS — Z23 Encounter for immunization: Secondary | ICD-10-CM

## 2012-02-14 DIAGNOSIS — Z7901 Long term (current) use of anticoagulants: Secondary | ICD-10-CM

## 2012-02-14 DIAGNOSIS — I4891 Unspecified atrial fibrillation: Secondary | ICD-10-CM

## 2012-02-14 MED ORDER — WARFARIN SODIUM 1 MG PO TABS: 1.0000 mg | ORAL_TABLET | ORAL | Status: DC

## 2012-02-14 NOTE — Telephone Encounter (Signed)
Message copied by Ricki Miller on Fri Feb 14, 2012  2:45 PM ------      Message from: Ellamae Sia      Created: Fri Feb 14, 2012  2:18 PM       Please send in 1 mg tablets, warfarin to [facility]

## 2012-02-14 NOTE — Patient Instructions (Signed)
2 mg daily (reduced dose by 3.5 mg weekly) [name] will call in 1 mg tablets

## 2012-02-21 ENCOUNTER — Ambulatory Visit (INDEPENDENT_AMBULATORY_CARE_PROVIDER_SITE_OTHER): Payer: Medicare Other | Admitting: Family Medicine

## 2012-02-21 DIAGNOSIS — Z5181 Encounter for therapeutic drug level monitoring: Secondary | ICD-10-CM

## 2012-02-21 DIAGNOSIS — Z7901 Long term (current) use of anticoagulants: Secondary | ICD-10-CM

## 2012-02-21 DIAGNOSIS — I4891 Unspecified atrial fibrillation: Secondary | ICD-10-CM

## 2012-02-21 LAB — POCT INR: INR: 1.9

## 2012-02-21 NOTE — Patient Instructions (Signed)
1 mg daily except, 2 mg MWF, 2 weeks

## 2012-03-06 ENCOUNTER — Ambulatory Visit (INDEPENDENT_AMBULATORY_CARE_PROVIDER_SITE_OTHER): Payer: Medicare Other | Admitting: Family Medicine

## 2012-03-06 DIAGNOSIS — Z7901 Long term (current) use of anticoagulants: Secondary | ICD-10-CM

## 2012-03-06 DIAGNOSIS — I4891 Unspecified atrial fibrillation: Secondary | ICD-10-CM

## 2012-03-06 DIAGNOSIS — Z5181 Encounter for therapeutic drug level monitoring: Secondary | ICD-10-CM

## 2012-03-06 LAB — POCT INR: INR: 1.6

## 2012-03-06 NOTE — Patient Instructions (Addendum)
2 mg daily, except T/TH take 1 mg

## 2012-03-11 ENCOUNTER — Other Ambulatory Visit: Payer: Self-pay | Admitting: *Deleted

## 2012-03-11 DIAGNOSIS — I5042 Chronic combined systolic (congestive) and diastolic (congestive) heart failure: Secondary | ICD-10-CM

## 2012-03-11 MED ORDER — FUROSEMIDE 40 MG PO TABS: 40.0000 mg | ORAL_TABLET | Freq: Every day | ORAL | Status: DC

## 2012-03-17 ENCOUNTER — Ambulatory Visit (INDEPENDENT_AMBULATORY_CARE_PROVIDER_SITE_OTHER): Payer: Medicare Other | Admitting: Family Medicine

## 2012-03-17 VITALS — BP 120/80 | HR 76 | Temp 97.8°F | Wt 186.0 lb

## 2012-03-17 DIAGNOSIS — J019 Acute sinusitis, unspecified: Secondary | ICD-10-CM

## 2012-03-17 DIAGNOSIS — Z1231 Encounter for screening mammogram for malignant neoplasm of breast: Secondary | ICD-10-CM

## 2012-03-17 MED ORDER — CEFUROXIME AXETIL 250 MG PO TABS: 250.0000 mg | ORAL_TABLET | Freq: Two times a day (BID) | ORAL | Status: DC

## 2012-03-17 MED ORDER — FLUTICASONE PROPIONATE 50 MCG/ACT NA SUSP: 2.0000 | Freq: Every day | NASAL | Status: DC

## 2012-03-17 NOTE — Progress Notes (Signed)
Subjective:    Patient ID: [name], female    DOB: 08/22/1933, 78 y.o.   MRN: [id number]  HPI  1.  URI-  Had saw throat 3 weeks ago, now for past 2 weeks increased sinus congestion and pressure. Teeth hurt.  No fevers or chills.   No CP, no wheezing, no SOB.     Current Outpatient Prescriptions on File Prior to Visit  Medication Sig Dispense Refill  . aspirin 81 MG EC tablet Take 81 mg by mouth daily after breakfast.       . atorvastatin (LIPITOR) 40 MG tablet Take 1 tablet (40 mg total) by mouth every evening.  30 tablet  6  . carvedilol (COREG) 12.5 MG tablet Take 1 tablet (12.5 mg total) by mouth 2 (two) times daily with a meal.  60 tablet  5  . cetirizine (ZYRTEC) 10 MG tablet Take 10 mg by mouth daily.       . disopyramide (NORPACE) 150 MG capsule Take 1 capsule (150 mg total) by mouth 2 (two) times daily.  60 capsule  6  . furosemide (LASIX) 40 MG tablet Take 1 tablet (40 mg total) by mouth daily.  30 tablet  4  . potassium chloride (K-DUR) 10 MEQ tablet Take 1 tablet (10 mEq total) by mouth 2 (two) times daily.  60 tablet  3  . verapamil (CALAN-SR) 180 MG CR tablet Take 180 mg by mouth at bedtime.       Marland Kitchen warfarin (COUMADIN) 1 MG tablet Take 1 tablet (1 mg total) by mouth as directed.  30 tablet  2  . warfarin (COUMADIN) 5 MG tablet Take 0.5 tablets (2.5 mg total) by mouth daily.  30 tablet  5  . [DISCONTINUED] hydrochlorothiazide (HYDRODIURIL) 25 MG tablet Take 25 mg by mouth daily after breakfast.       . [DISCONTINUED] potassium chloride SA (KLOR-CON M20) 20 MEQ tablet Take 20 mEq by mouth daily.         Allergies  Allergen Reactions  . Codeine     REACTION: N \\T \ V  . Sulfonamide Derivatives     REACTION: Dry mouth    Past Medical History  Diagnosis Date  . Carotid artery stenosis 09/2007    60-79% bilateral (stable)  . Carotid artery stenosis 10/2008    40-59% R 60-79%   . Chest pain, unspecified   . Automatic implantable cardiac defibrillator in situ     . Atrial fibrillation   . Sinus bradycardia   . Hypotension, unspecified     cardiac cath 2006..nonobstructive CAD 30-40s lesions.Marland KitchenETT 1/09 nondiagnostic due to poor HR response..Right Renal Cancer 2003  . History of hysterectomy 1975    FOR BENIGN CAUSES  . Adjustment disorder with anxiety   . Blood in stool   . Secondary cardiomyopathy, unspecified   . Chronic airway obstruction, not elsewhere classified   . Long term (current) use of anticoagulants   . Dysuria   . Edema   . Encounter for therapeutic drug monitoring   . Other malaise and fatigue   . Hypertrophic obstructive cardiomyopathy   . Other and unspecified hyperlipidemia   . Urge incontinence   . Other acute sinusitis   . Other and unspecified coagulation defects   . Other screening mammogram   . Pre-operative cardiovascular examination   . Hemorrhage of rectum and anus   . Chronic kidney disease, unspecified   . Special screening for osteoporosis   . Renal cancer 05-01-11    2'03-Right  .  Malignant neoplasm of kidney, except pelvis   . Shortness of breath 05-01-11    exertional SOB only  . Arthritis 05-01-11    Right hip osteoarthritis  . Hypertension 05/20/2011    Past Surgical History  Procedure Date  . Abdominal hysterectomy 1975    for benign causes  . Cardiac catheterization 2006  . Nephrectomy     2'03-RIGHT S/P RENAL CELL CANCER  . Cataract extraction 05-01-11    bil. with lens implant  . Insert / replace / remove pacemaker 05-01-11    02-28-05-/05-04-10-ICD-MEDTRONIC MAXIMAL DR  . Eye surgery 05-01-11    bil. cataract only  . Appendectomy   . Total hip arthroplasty 05/03/2011    Procedure: TOTAL HIP ARTHROPLASTY ANTERIOR APPROACH;  Surgeon: Mcarthur Rossetti;  Location: [facility];  Service: Orthopedics;  Laterality: Right;  Removal of Cannulated Screws Right Hip, Right Direct Anterior Hip Replacement    Family History  Problem Relation Age of Onset  . Colon cancer Neg Hx     History    Social History  . Marital Status: Widowed    Spouse Name: N/A    Number of Children: N/A  . Years of Education: N/A   Occupational History  . Retired    Social History Main Topics  . Smoking status: Former Smoker -- 0.5 packs/day for 40 years    Types: Cigarettes    Quit date: 05/06/2001  . Smokeless tobacco: Not on file  . Alcohol Use: No  . Drug Use: No  . Sexually Active: No   Other Topics Concern  . 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 No distress.  HEENT:  Pos PND, + maxillary sinus tenderness, TMs clear bilaterally Resp:  CTA bilaterally CVS: RRR Ext:  No edema     Assessment & Plan:  1.  Sinusitis- Given duration and progression of symptoms, will treat for bacterial sinusitis with Ceftin. Add flonase as well, continue Zyrtec.  Call clinic if no improvement in 5-7 days. The patient indicates understanding of these issues and agrees with the plan.

## 2012-03-17 NOTE — Patient Instructions (Addendum)
Take antibiotic as directed- Ceftin 250 mg twice daily x 10 days.  Drink lots of fluids.  Treat sympotmatically with Mucinex, nasal saline irrigation, and Tylenol/Ibuprofen. We are also adding Flonase, continue Zyrtec. Call if not improving as expected in 5-7 days.   Please stop by to see Rosaria Ferries on your way out to set up your mammogram.

## 2012-03-20 ENCOUNTER — Ambulatory Visit (INDEPENDENT_AMBULATORY_CARE_PROVIDER_SITE_OTHER): Payer: Medicare Other | Admitting: Family Medicine

## 2012-03-20 DIAGNOSIS — I4891 Unspecified atrial fibrillation: Secondary | ICD-10-CM

## 2012-03-20 DIAGNOSIS — Z5181 Encounter for therapeutic drug level monitoring: Secondary | ICD-10-CM

## 2012-03-20 DIAGNOSIS — Z7901 Long term (current) use of anticoagulants: Secondary | ICD-10-CM

## 2012-03-20 LAB — POCT INR: INR: 1.7

## 2012-03-20 NOTE — Patient Instructions (Signed)
Increase to 2 mg daily, except 2.5 mg Fri ( increased by 2.5 mg weekly) recheck 2 weeks

## 2012-03-24 ENCOUNTER — Ambulatory Visit (INDEPENDENT_AMBULATORY_CARE_PROVIDER_SITE_OTHER): Payer: Medicare Other | Admitting: Family Medicine

## 2012-03-24 DIAGNOSIS — Z7901 Long term (current) use of anticoagulants: Secondary | ICD-10-CM

## 2012-03-24 DIAGNOSIS — Z5181 Encounter for therapeutic drug level monitoring: Secondary | ICD-10-CM

## 2012-03-24 DIAGNOSIS — I4891 Unspecified atrial fibrillation: Secondary | ICD-10-CM

## 2012-03-24 LAB — POCT INR: INR: 2.4

## 2012-03-24 NOTE — Patient Instructions (Signed)
Continue current dose, check in 2 weeks

## 2012-04-06 ENCOUNTER — Ambulatory Visit: Payer: Medicare Other

## 2012-04-07 ENCOUNTER — Ambulatory Visit: Payer: Medicare Other

## 2012-04-21 ENCOUNTER — Ambulatory Visit: Payer: Medicare Other

## 2012-04-23 ENCOUNTER — Other Ambulatory Visit: Payer: Self-pay | Admitting: General Practice

## 2012-04-23 ENCOUNTER — Ambulatory Visit (INDEPENDENT_AMBULATORY_CARE_PROVIDER_SITE_OTHER): Payer: Medicare Other | Admitting: General Practice

## 2012-04-23 DIAGNOSIS — Z7901 Long term (current) use of anticoagulants: Secondary | ICD-10-CM

## 2012-04-23 DIAGNOSIS — I4891 Unspecified atrial fibrillation: Secondary | ICD-10-CM

## 2012-04-23 DIAGNOSIS — Z5181 Encounter for therapeutic drug level monitoring: Secondary | ICD-10-CM

## 2012-04-23 LAB — POCT INR: INR: 3.8

## 2012-04-23 MED ORDER — WARFARIN SODIUM 1 MG PO TABS: 1.0000 mg | ORAL_TABLET | ORAL | Status: DC

## 2012-04-27 ENCOUNTER — Encounter: Payer: Self-pay | Admitting: Internal Medicine

## 2012-04-27 ENCOUNTER — Ambulatory Visit (INDEPENDENT_AMBULATORY_CARE_PROVIDER_SITE_OTHER): Payer: Medicare Other | Admitting: *Deleted

## 2012-04-27 DIAGNOSIS — I421 Obstructive hypertrophic cardiomyopathy: Secondary | ICD-10-CM

## 2012-04-27 DIAGNOSIS — I5042 Chronic combined systolic (congestive) and diastolic (congestive) heart failure: Secondary | ICD-10-CM

## 2012-04-27 LAB — ICD DEVICE OBSERVATION
AL AMPLITUDE: 1.625 mv
AL IMPEDENCE ICD: 418 Ohm
AL THRESHOLD: 1.25 V
ATRIAL PACING ICD: 94.98 pct
BAMS-0001: 170 {beats}/min
BATTERY VOLTAGE: 3.1185 V
CHARGE TIME: 8.958 s
DEV-0020ICD: NEGATIVE
FVT: 0
PACEART VT: 0
RV LEAD AMPLITUDE: 9.875 mv
RV LEAD IMPEDENCE ICD: 513 Ohm
RV LEAD THRESHOLD: 1 V
TOT-0001: 1
TOT-0002: 0
TOT-0006: 20111230000000
TZAT-0001ATACH: 1
TZAT-0001ATACH: 2
TZAT-0001ATACH: 3
TZAT-0001FASTVT: 1
TZAT-0001SLOWVT: 1
TZAT-0001SLOWVT: 2
TZAT-0002ATACH: NEGATIVE
TZAT-0002ATACH: NEGATIVE
TZAT-0002ATACH: NEGATIVE
TZAT-0002FASTVT: NEGATIVE
TZAT-0004SLOWVT: 8
TZAT-0004SLOWVT: 8
TZAT-0005SLOWVT: 84 pct
TZAT-0005SLOWVT: 91 pct
TZAT-0011SLOWVT: 10 ms
TZAT-0011SLOWVT: 10 ms
TZAT-0012ATACH: 150 ms
TZAT-0012ATACH: 150 ms
TZAT-0012ATACH: 150 ms
TZAT-0012FASTVT: 170 ms
TZAT-0012SLOWVT: 170 ms
TZAT-0012SLOWVT: 170 ms
TZAT-0013SLOWVT: 2
TZAT-0013SLOWVT: 2
TZAT-0018ATACH: NEGATIVE
TZAT-0018ATACH: NEGATIVE
TZAT-0018ATACH: NEGATIVE
TZAT-0018FASTVT: NEGATIVE
TZAT-0018SLOWVT: NEGATIVE
TZAT-0018SLOWVT: NEGATIVE
TZAT-0019ATACH: 6 V
TZAT-0019ATACH: 6 V
TZAT-0019ATACH: 6 V
TZAT-0019FASTVT: 8 V
TZAT-0019SLOWVT: 8 V
TZAT-0019SLOWVT: 8 V
TZAT-0020ATACH: 1.5 ms
TZAT-0020ATACH: 1.5 ms
TZAT-0020ATACH: 1.5 ms
TZAT-0020FASTVT: 1.5 ms
TZAT-0020SLOWVT: 1.5 ms
TZAT-0020SLOWVT: 1.5 ms
TZON-0003ATACH: 350 ms
TZON-0003SLOWVT: 350 ms
TZON-0003VSLOWVT: 400 ms
TZON-0004SLOWVT: 16
TZON-0004VSLOWVT: 20
TZON-0005SLOWVT: 12
TZST-0001ATACH: 4
TZST-0001ATACH: 5
TZST-0001ATACH: 6
TZST-0001FASTVT: 2
TZST-0001FASTVT: 3
TZST-0001FASTVT: 4
TZST-0001FASTVT: 5
TZST-0001FASTVT: 6
TZST-0001SLOWVT: 3
TZST-0001SLOWVT: 4
TZST-0001SLOWVT: 5
TZST-0001SLOWVT: 6
TZST-0002ATACH: NEGATIVE
TZST-0002ATACH: NEGATIVE
TZST-0002ATACH: NEGATIVE
TZST-0002FASTVT: NEGATIVE
TZST-0002FASTVT: NEGATIVE
TZST-0002FASTVT: NEGATIVE
TZST-0002FASTVT: NEGATIVE
TZST-0002FASTVT: NEGATIVE
TZST-0003SLOWVT: 25 J
TZST-0003SLOWVT: 35 J
TZST-0003SLOWVT: 35 J
TZST-0003SLOWVT: 35 J
VENTRICULAR PACING ICD: 97.74 pct
VF: 0

## 2012-04-27 NOTE — Patient Instructions (Addendum)
Return office visit 07/10/12 @ 2:00pm with the device clinic.

## 2012-04-27 NOTE — Progress Notes (Signed)
ICD check with ICM 

## 2012-05-04 ENCOUNTER — Ambulatory Visit: Payer: Self-pay | Admitting: Family Medicine

## 2012-05-07 ENCOUNTER — Telehealth: Payer: Self-pay | Admitting: *Deleted

## 2012-05-07 ENCOUNTER — Encounter: Payer: Self-pay | Admitting: Family Medicine

## 2012-05-07 ENCOUNTER — Ambulatory Visit: Payer: Medicare Other

## 2012-05-07 LAB — HM MAMMOGRAPHY: HM Mammogram: NORMAL

## 2012-05-07 NOTE — Telephone Encounter (Signed)
Advised patient of mammogram results.  Updated in chart.

## 2012-05-18 ENCOUNTER — Ambulatory Visit (INDEPENDENT_AMBULATORY_CARE_PROVIDER_SITE_OTHER): Payer: Medicare Other | Admitting: General Practice

## 2012-05-18 DIAGNOSIS — I4891 Unspecified atrial fibrillation: Secondary | ICD-10-CM

## 2012-05-18 DIAGNOSIS — Z95810 Presence of automatic (implantable) cardiac defibrillator: Secondary | ICD-10-CM

## 2012-05-18 DIAGNOSIS — I739 Peripheral vascular disease, unspecified: Secondary | ICD-10-CM

## 2012-05-18 LAB — POCT INR: INR: 1.8

## 2012-05-28 ENCOUNTER — Encounter (INDEPENDENT_AMBULATORY_CARE_PROVIDER_SITE_OTHER): Payer: Medicare Other

## 2012-05-28 DIAGNOSIS — I6529 Occlusion and stenosis of unspecified carotid artery: Secondary | ICD-10-CM

## 2012-06-01 ENCOUNTER — Encounter: Payer: Self-pay | Admitting: Family Medicine

## 2012-06-08 ENCOUNTER — Ambulatory Visit (INDEPENDENT_AMBULATORY_CARE_PROVIDER_SITE_OTHER): Payer: Medicare Other | Admitting: General Practice

## 2012-06-08 DIAGNOSIS — Z7901 Long term (current) use of anticoagulants: Secondary | ICD-10-CM

## 2012-06-08 DIAGNOSIS — Z5181 Encounter for therapeutic drug level monitoring: Secondary | ICD-10-CM

## 2012-06-08 DIAGNOSIS — I4891 Unspecified atrial fibrillation: Secondary | ICD-10-CM

## 2012-06-08 LAB — POCT INR: INR: 2.2

## 2012-06-15 ENCOUNTER — Other Ambulatory Visit: Payer: Self-pay | Admitting: *Deleted

## 2012-06-15 MED ORDER — CARVEDILOL 12.5 MG PO TABS: 12.5000 mg | ORAL_TABLET | Freq: Two times a day (BID) | ORAL | Status: DC

## 2012-06-15 NOTE — Telephone Encounter (Signed)
Refilled Carvedilol sent to [facility].

## 2012-06-16 ENCOUNTER — Ambulatory Visit (INDEPENDENT_AMBULATORY_CARE_PROVIDER_SITE_OTHER): Payer: Medicare Other | Admitting: Family Medicine

## 2012-06-16 ENCOUNTER — Encounter: Payer: Self-pay | Admitting: Family Medicine

## 2012-06-16 VITALS — BP 132/82 | HR 64 | Temp 97.6°F | Wt 183.5 lb

## 2012-06-16 DIAGNOSIS — R350 Frequency of micturition: Secondary | ICD-10-CM

## 2012-06-16 DIAGNOSIS — R0981 Nasal congestion: Secondary | ICD-10-CM | POA: Insufficient documentation

## 2012-06-16 DIAGNOSIS — J3489 Other specified disorders of nose and nasal sinuses: Secondary | ICD-10-CM

## 2012-06-16 LAB — POCT URINALYSIS DIPSTICK
Bilirubin, UA: NEGATIVE
Glucose, UA: NEGATIVE
Ketones, UA: NEGATIVE
Leukocytes, UA: NEGATIVE
Nitrite, UA: NEGATIVE
Protein, UA: NEGATIVE
Spec Grav, UA: 1.015
Urobilinogen, UA: 0.2
pH, UA: 6

## 2012-06-16 MED ORDER — FLUTICASONE PROPIONATE 50 MCG/ACT NA SUSP: 2.0000 | Freq: Every day | NASAL | Status: DC

## 2012-06-16 NOTE — Progress Notes (Signed)
Subjective:    Patient ID: [name], female    DOB: 1933-06-24, 79 y.o.   MRN: [id number]  HPI CC: I feel like i'm getting a sinus infection  H/o recurrent sinusitis.  sxs started 3d ago.  More congestion, PNdrainage, rhinorrhea.  Clear mucous, then this morning thicker mucous.  So far tried zyrtec.  Did not tolerate flonase well.  No fevers/chills, abd pain, nausea/vomiting, ear or tooth pain, HA.  No dysuria.  No hematuria.  Also possibly with UTI - mild backache, as well as frequency (is on lasix).  + some urgency.  H/o solitary kidney from R kidney cancer. H/o HOCM on coumadin.  No smokers at home. No sick contacts at home.  No h/o asthma or COPD.  Wt Readings from Last 3 Encounters:  06/16/12 183 lb 8 oz (83.235 kg)  03/17/12 186 lb (84.369 kg)  01/21/12 183 lb (83.008 kg)   Past Medical History  Diagnosis Date  . Carotid artery stenosis 09/2007    60-79% bilateral (stable)  . Carotid artery stenosis 10/2008    40-59% R 60-79%   . Chest pain, unspecified   . Automatic implantable cardiac defibrillator in situ   . Atrial fibrillation   . Sinus bradycardia   . Hypotension, unspecified     cardiac cath 2006..nonobstructive CAD 30-40s lesions.Marland KitchenETT 1/09 nondiagnostic due to poor HR response..Right Renal Cancer 2003  . History of hysterectomy 1975    FOR BENIGN CAUSES  . Adjustment disorder with anxiety   . Blood in stool   . Secondary cardiomyopathy, unspecified   . Chronic airway obstruction, not elsewhere classified   . Long term (current) use of anticoagulants   . Dysuria   . Edema   . Encounter for therapeutic drug monitoring   . Other malaise and fatigue   . Hypertrophic obstructive cardiomyopathy   . Other and unspecified hyperlipidemia   . Urge incontinence   . Other acute sinusitis   . Other and unspecified coagulation defects   . Other screening mammogram   . Pre-operative cardiovascular examination   . Hemorrhage of rectum and anus   .  Chronic kidney disease, unspecified   . Special screening for osteoporosis   . Renal cancer 05-01-11    2'03-Right  . Malignant neoplasm of kidney, except pelvis   . Shortness of breath 05-01-11    exertional SOB only  . Arthritis 05-01-11    Right hip osteoarthritis  . Hypertension 05/20/2011    Review of Systems Per HPI    Objective:   Physical Exam  Nursing note and vitals reviewed. Constitutional: She appears well-developed and well-nourished. No distress.  HENT:  Head: Normocephalic and atraumatic.  Right Ear: Hearing and external ear normal.  Left Ear: Hearing and external ear normal.  Nose: No mucosal edema or rhinorrhea. Right sinus exhibits no maxillary sinus tenderness and no frontal sinus tenderness. Left sinus exhibits no maxillary sinus tenderness and no frontal sinus tenderness.  Mouth/Throat: Uvula is midline, oropharynx is clear and moist and mucous membranes are normal. No oropharyngeal exudate, posterior oropharyngeal edema, posterior oropharyngeal erythema or tonsillar abscesses.  Cerumen bilaterally in canals  Eyes: Conjunctivae and EOM are normal. Pupils are equal, round, and reactive to light. No scleral icterus.  Neck: Normal range of motion. Neck supple.  Cardiovascular: Normal rate, regular rhythm, normal heart sounds and intact distal pulses.   No murmur heard. Pulmonary/Chest: Effort normal and breath sounds normal. No respiratory distress. She has no wheezes. She has no rales.  Abdominal: Soft. Bowel sounds are normal. She exhibits no distension. There is no hepatosplenomegaly. There is tenderness (mild pressure) in the suprapubic area. There is no rebound, no guarding and no CVA tenderness. No hernia.  Musculoskeletal: She exhibits no edema.  Lymphadenopathy:    She has no cervical adenopathy.  Skin: Skin is warm and dry. No rash noted.       Assessment & Plan:

## 2012-06-16 NOTE — Patient Instructions (Addendum)
Urine looking clear today.  Push fluids for urine. I think you have sinus congestion, but not bacterial infection Treat with continued zyrtec, start simple mucinex or immediate release guaifenesin with plenty of fluid to mobilize mucous, and restart nasal steroid - trial of this for about a week to see if it will help. Update [place] if not improving as expected or any worsening cough, fever >101, or headache/congestion.

## 2012-06-16 NOTE — Assessment & Plan Note (Signed)
Lungs clear today - reassured. Red flags to return discussed.

## 2012-06-16 NOTE — Assessment & Plan Note (Addendum)
No evidence of infection today - discussed this. Recommended restart flonase and mucinex. Red flags to return discussed. See pt instructions. Discussed reasons to return or call us with update.

## 2012-07-03 ENCOUNTER — Other Ambulatory Visit: Payer: Self-pay | Admitting: *Deleted

## 2012-07-03 MED ORDER — WARFARIN SODIUM 1 MG PO TABS: 1.0000 mg | ORAL_TABLET | ORAL | Status: DC

## 2012-07-06 ENCOUNTER — Ambulatory Visit: Payer: Medicare Other

## 2012-07-09 ENCOUNTER — Ambulatory Visit (INDEPENDENT_AMBULATORY_CARE_PROVIDER_SITE_OTHER): Payer: Medicare Other | Admitting: General Practice

## 2012-07-09 DIAGNOSIS — I4891 Unspecified atrial fibrillation: Secondary | ICD-10-CM

## 2012-07-09 DIAGNOSIS — Z5181 Encounter for therapeutic drug level monitoring: Secondary | ICD-10-CM

## 2012-07-09 DIAGNOSIS — Z7901 Long term (current) use of anticoagulants: Secondary | ICD-10-CM

## 2012-07-09 LAB — POCT INR: INR: 2.2

## 2012-08-06 ENCOUNTER — Ambulatory Visit (INDEPENDENT_AMBULATORY_CARE_PROVIDER_SITE_OTHER): Payer: Medicare Other | Admitting: General Practice

## 2012-08-06 DIAGNOSIS — Z5181 Encounter for therapeutic drug level monitoring: Secondary | ICD-10-CM

## 2012-08-06 DIAGNOSIS — Z7901 Long term (current) use of anticoagulants: Secondary | ICD-10-CM

## 2012-08-06 DIAGNOSIS — I4891 Unspecified atrial fibrillation: Secondary | ICD-10-CM

## 2012-08-06 LAB — POCT INR: INR: 2.2

## 2012-08-18 ENCOUNTER — Encounter: Payer: Self-pay | Admitting: *Deleted

## 2012-09-01 ENCOUNTER — Ambulatory Visit (INDEPENDENT_AMBULATORY_CARE_PROVIDER_SITE_OTHER): Payer: Medicare Other | Admitting: Family Medicine

## 2012-09-01 ENCOUNTER — Encounter: Payer: Self-pay | Admitting: Family Medicine

## 2012-09-01 VITALS — BP 130/64 | HR 60 | Temp 97.5°F | Wt 187.0 lb

## 2012-09-01 DIAGNOSIS — R319 Hematuria, unspecified: Secondary | ICD-10-CM

## 2012-09-01 DIAGNOSIS — R109 Unspecified abdominal pain: Secondary | ICD-10-CM

## 2012-09-01 LAB — CBC WITH DIFFERENTIAL/PLATELET
Basophils Absolute: 0 10*3/uL (ref 0.0–0.1)
Basophils Relative: 0.7 % (ref 0.0–3.0)
Eosinophils Absolute: 0.1 10*3/uL (ref 0.0–0.7)
Eosinophils Relative: 2.7 % (ref 0.0–5.0)
HCT: 36.1 % (ref 36.0–46.0)
Hemoglobin: 12.4 g/dL (ref 12.0–15.0)
Lymphocytes Relative: 28.4 % (ref 12.0–46.0)
Lymphs Abs: 1.4 10*3/uL (ref 0.7–4.0)
MCHC: 34.2 g/dL (ref 30.0–36.0)
MCV: 88.1 fl (ref 78.0–100.0)
Monocytes Absolute: 0.7 10*3/uL (ref 0.1–1.0)
Monocytes Relative: 14 % — ABNORMAL HIGH (ref 3.0–12.0)
Neutro Abs: 2.7 10*3/uL (ref 1.4–7.7)
Neutrophils Relative %: 54.2 % (ref 43.0–77.0)
Platelets: 185 10*3/uL (ref 150.0–400.0)
RBC: 4.1 Mil/uL (ref 3.87–5.11)
RDW: 15.1 % — ABNORMAL HIGH (ref 11.5–14.6)
WBC: 4.9 10*3/uL (ref 4.5–10.5)

## 2012-09-01 LAB — COMPREHENSIVE METABOLIC PANEL
ALT: 23 U/L (ref 0–35)
AST: 23 U/L (ref 0–37)
Albumin: 4.5 g/dL (ref 3.5–5.2)
Alkaline Phosphatase: 60 U/L (ref 39–117)
BUN: 21 mg/dL (ref 6–23)
CO2: 32 mEq/L (ref 19–32)
Calcium: 9.7 mg/dL (ref 8.4–10.5)
Chloride: 101 mEq/L (ref 96–112)
Creatinine, Ser: 1.5 mg/dL — ABNORMAL HIGH (ref 0.4–1.2)
GFR: 36.4 mL/min — ABNORMAL LOW (ref >60.00)
Glucose, Bld: 66 mg/dL — ABNORMAL LOW (ref 70–99)
Potassium: 3.9 mEq/L (ref 3.5–5.1)
Sodium: 142 mEq/L (ref 135–145)
Total Bilirubin: 0.7 mg/dL (ref 0.3–1.2)
Total Protein: 7.3 g/dL (ref 6.0–8.3)

## 2012-09-01 LAB — POCT URINALYSIS DIPSTICK
Bilirubin, UA: NEGATIVE
Glucose, UA: NEGATIVE
Ketones, UA: NEGATIVE
Nitrite, UA: NEGATIVE
Protein, UA: NEGATIVE
Spec Grav, UA: 1.01
Urobilinogen, UA: NEGATIVE
pH, UA: 6

## 2012-09-01 LAB — LIPASE: Lipase: 24 U/L (ref 11.0–59.0)

## 2012-09-01 NOTE — Progress Notes (Signed)
Subjective:    Patient ID: [name], female    DOB: Feb 23, 1934, 79 y.o.   MRN: [id number]  HPI Very pleasant female with complicated medical history, including RCC, sinus brady s/p pacer, recurrent UTIs here for 2 days of abdominal pain that has changed in characteristics.  Initially was RUQ and epigastric.  Started after eating pizza with her friends.  No nausea or vomiting. No palpitations, SOB dizziness. No changes in her bowel habits.  That pain has resolved but this morning, having some suprapubic pain.  No dysuria or increased frequency.    Still a little "sore in upper stomach."  Patient Active Problem List   Diagnosis Date Noted  . Abdominal  pain, other specified site 09/01/2012  . Claudication 01/21/2012  . Chronic combined systolic and diastolic heart failure 0000000  . Hypertension 05/20/2011  . Avascular necrosis of hip 05/03/2011  . OTHER ACUTE SINUSITIS 03/15/2010  . FATIGUE 11/16/2009  . CARCINOMA, RENAL CELL 10/13/2009  . OTHER AND UNSPECIFIED COAGULATION DEFECTS 10/13/2009  . RECTAL BLEEDING 10/13/2009  . HOCM / IHSS 07/14/2009  . EDEMA 07/14/2009  . HYPERLIPIDEMIA 04/10/2009  . ADJUSTMENT DISORDER WITH ANXIETY 04/10/2009  . INCONTINENCE, URGE 04/10/2009  . CARDIOMYOPATHY, SECONDARY 10/27/2008  . ATRIAL FIBRILLATION 10/27/2008  . SINUS BRADYCARDIA 10/27/2008  . CAROTID ARTERY STENOSIS 10/27/2008  . COPD 10/27/2008  . RENAL INSUFFICIENCY, CHRONIC 10/27/2008  . ICD -Medtronic 10/27/2008   Past Medical History  Diagnosis Date  . Carotid artery stenosis 09/2007    60-79% bilateral (stable)  . Carotid artery stenosis 10/2008    40-59% R 60-79%   . Chest pain, unspecified   . Automatic implantable cardiac defibrillator in situ   . Atrial fibrillation   . Sinus bradycardia   . Hypotension, unspecified     cardiac cath 2006..nonobstructive CAD 30-40s lesions.Marland KitchenETT 1/09 nondiagnostic due to poor HR response..Right Renal Cancer 2003  . History of  hysterectomy 1975    FOR BENIGN CAUSES  . Adjustment disorder with anxiety   . Blood in stool   . Secondary cardiomyopathy, unspecified   . Chronic airway obstruction, not elsewhere classified   . Long term (current) use of anticoagulants   . Dysuria   . Edema   . Encounter for therapeutic drug monitoring   . Other malaise and fatigue   . Hypertrophic obstructive cardiomyopathy   . Other and unspecified hyperlipidemia   . Urge incontinence   . Other acute sinusitis   . Other and unspecified coagulation defects   . Other screening mammogram   . Pre-operative cardiovascular examination   . Hemorrhage of rectum and anus   . Chronic kidney disease, unspecified   . Special screening for osteoporosis   . Renal cancer 05-01-11    2'03-Right  . Malignant neoplasm of kidney, except pelvis   . Shortness of breath 05-01-11    exertional SOB only  . Arthritis 05-01-11    Right hip osteoarthritis  . Hypertension 05/20/2011   Past Surgical History  Procedure Laterality Date  . Abdominal hysterectomy  1975    for benign causes  . Cardiac catheterization  2006  . Nephrectomy      2'03-RIGHT S/P RENAL CELL CANCER  . Cataract extraction  05-01-11    bil. with lens implant  . Insert / replace / remove pacemaker  05-01-11    02-28-05-/05-04-10-ICD-MEDTRONIC MAXIMAL DR  . Eye surgery  05-01-11    bil. cataract only  . Appendectomy    . Total hip arthroplasty  05/03/2011  Procedure: TOTAL HIP ARTHROPLASTY ANTERIOR APPROACH;  Surgeon: Mcarthur Rossetti;  Location: [facility];  Service: Orthopedics;  Laterality: Right;  Removal of Cannulated Screws Right Hip, Right Direct Anterior Hip Replacement   History  Substance Use Topics  . Smoking status: Former Smoker -- 0.50 packs/day for 40 years    Types: Cigarettes    Quit date: 05/06/2001  . Smokeless tobacco: Not on file  . Alcohol Use: No   Family History  Problem Relation Age of Onset  . Colon cancer Neg Hx    Allergies   Allergen Reactions  . Codeine     REACTION: N \\T \ V  . Sulfonamide Derivatives     REACTION: Dry mouth   Current Outpatient Prescriptions on File Prior to Visit  Medication Sig Dispense Refill  . aspirin 81 MG EC tablet Take 81 mg by mouth daily after breakfast.       . atorvastatin (LIPITOR) 40 MG tablet Take 1 tablet (40 mg total) by mouth every evening.  30 tablet  6  . carvedilol (COREG) 12.5 MG tablet Take 1 tablet (12.5 mg total) by mouth 2 (two) times daily with a meal.  60 tablet  5  . cetirizine (ZYRTEC) 10 MG tablet Take 10 mg by mouth daily.       . disopyramide (NORPACE) 150 MG capsule Take 1 capsule (150 mg total) by mouth 2 (two) times daily.  60 capsule  6  . furosemide (LASIX) 40 MG tablet Take 1 tablet (40 mg total) by mouth daily.  30 tablet  4  . verapamil (CALAN-SR) 180 MG CR tablet Take 180 mg by mouth at bedtime.       Marland Kitchen warfarin (COUMADIN) 1 MG tablet Take 1 tablet (1 mg total) by mouth as directed. Take as directed by coumadin clinic  70 tablet  1  . [DISCONTINUED] hydrochlorothiazide (HYDRODIURIL) 25 MG tablet Take 25 mg by mouth daily after breakfast.       . [DISCONTINUED] potassium chloride SA (KLOR-CON M20) 20 MEQ tablet Take 20 mEq by mouth daily.        No current facility-administered medications on file prior to visit.   The PMH, PSH, Social History, Family History, Medications, and allergies have been reviewed in [facility], and have been updated if relevant.    Review of Systems See HPI No fevers    Objective:   Physical Exam BP 130/64  Pulse 60  Temp(Src) 97.5 F (36.4 C)  Wt 187 lb (84.823 kg)  BMI 29.28 kg/m2 Gen:  Alert, pleasant, NAD CVS:    RRR Resp:  CTA bilaterally Abd:   Soft, NTTP over RUQ, diffusely mildly TTP over epigastrium. No CVA tenderness No suprapubic tenderness    Assessment & Plan:  1. Abdominal  pain, other specified site Intermittent. ?biliary colic  Does not seem cardiac in nature.  Has cardiology appt scheduled in a  week or two per pt. Will check labs, abdominal US.  She has pacer, so limited imaging. The patient indicates understanding of these issues and agrees with the plan.  - US Abdomen Limited RUQ; Future - Lipase - Comprehensive metabolic panel - CBC with Differential

## 2012-09-01 NOTE — Addendum Note (Signed)
Addended by: Ricki Miller on: 09/01/2012 12:40 PM   Modules accepted: Orders

## 2012-09-01 NOTE — Patient Instructions (Addendum)
Good to see you. Please go to the lab. Please stop by to see Rosaria Ferries on your way out to set up your ultrasound.  We will call you with your lab results and urine culture results.  If your pain worsened, please let me know immediately or go to ER.

## 2012-09-03 LAB — URINE CULTURE: Colony Count: 7000

## 2012-09-04 ENCOUNTER — Other Ambulatory Visit: Payer: Self-pay | Admitting: Family Medicine

## 2012-09-04 ENCOUNTER — Ambulatory Visit: Payer: Self-pay | Admitting: Family Medicine

## 2012-09-04 ENCOUNTER — Encounter: Payer: Self-pay | Admitting: Family Medicine

## 2012-09-04 DIAGNOSIS — K8020 Calculus of gallbladder without cholecystitis without obstruction: Secondary | ICD-10-CM

## 2012-09-07 ENCOUNTER — Other Ambulatory Visit: Payer: Self-pay | Admitting: Emergency Medicine

## 2012-09-07 DIAGNOSIS — I5042 Chronic combined systolic (congestive) and diastolic (congestive) heart failure: Secondary | ICD-10-CM

## 2012-09-07 MED ORDER — FUROSEMIDE 40 MG PO TABS: 40.0000 mg | ORAL_TABLET | Freq: Every day | ORAL | Status: DC

## 2012-09-11 ENCOUNTER — Other Ambulatory Visit: Payer: Self-pay | Admitting: *Deleted

## 2012-09-14 ENCOUNTER — Other Ambulatory Visit: Payer: Self-pay | Admitting: General Practice

## 2012-09-14 MED ORDER — WARFARIN SODIUM 1 MG PO TABS: 1.0000 mg | ORAL_TABLET | ORAL | Status: DC

## 2012-09-15 ENCOUNTER — Encounter (INDEPENDENT_AMBULATORY_CARE_PROVIDER_SITE_OTHER): Payer: Self-pay | Admitting: Surgery

## 2012-09-15 ENCOUNTER — Encounter (INDEPENDENT_AMBULATORY_CARE_PROVIDER_SITE_OTHER): Payer: Self-pay | Admitting: General Surgery

## 2012-09-15 ENCOUNTER — Ambulatory Visit (INDEPENDENT_AMBULATORY_CARE_PROVIDER_SITE_OTHER): Payer: Medicare Other | Admitting: Surgery

## 2012-09-15 ENCOUNTER — Other Ambulatory Visit (INDEPENDENT_AMBULATORY_CARE_PROVIDER_SITE_OTHER): Payer: Self-pay | Admitting: Surgery

## 2012-09-15 VITALS — BP 142/83 | HR 72 | Temp 98.6°F | Resp 16 | Ht 67.5 in | Wt 186.0 lb

## 2012-09-15 DIAGNOSIS — K829 Disease of gallbladder, unspecified: Secondary | ICD-10-CM

## 2012-09-15 DIAGNOSIS — K8010 Calculus of gallbladder with chronic cholecystitis without obstruction: Secondary | ICD-10-CM

## 2012-09-15 NOTE — Progress Notes (Signed)
Patient ID: [name], female   DOB: 1933-08-09, 79 y.o.   MRN: [id number]  Chief Complaint  Patient presents with  . Abdominal Pain    gallbladder    HPI [name] is a 79 y.o. female.  Referred by Dr. [name] for evaluation of gallbladder disease Abdominal Pain Associated symptoms include nausea. Pertinent negatives include no arthralgias, constipation, diarrhea, fever, headaches, hematuria or vomiting.   This is a 79 year old female with a complex past medical surgical history who presents with about a month of intermittent upper abdominal pain with radiation around her right side to her back. This initially started after eating some pizza. This was associated with some bloating but no nausea or vomiting. She denies any diarrhea.  She continues to have some milder episodes.  Ultrasound showed cholelithiasis but no sign of cholecystitis. Common bile duct was normal.  She is now referred for surgical evaluation and management.  Past Medical History  Diagnosis Date  . Carotid artery stenosis 09/2007    60-79% bilateral (stable)  . Carotid artery stenosis 10/2008    40-59% R 60-79%   . Chest pain, unspecified   . Automatic implantable cardiac defibrillator in situ   . Atrial fibrillation   . Sinus bradycardia   . Hypotension, unspecified     cardiac cath 2006..nonobstructive CAD 30-40s lesions.Marland KitchenETT 1/09 nondiagnostic due to poor HR response..Right Renal Cancer 2003  . History of hysterectomy 1975    FOR BENIGN CAUSES  . Adjustment disorder with anxiety   . Blood in stool   . Secondary cardiomyopathy, unspecified   . Chronic airway obstruction, not elsewhere classified   . Long term (current) use of anticoagulants   . Dysuria   . Edema   . Encounter for therapeutic drug monitoring   . Other malaise and fatigue   . Hypertrophic obstructive cardiomyopathy   . Other and unspecified hyperlipidemia   . Urge incontinence   . Other acute sinusitis   . Other and  unspecified coagulation defects   . Other screening mammogram   . Pre-operative cardiovascular examination   . Hemorrhage of rectum and anus   . Chronic kidney disease, unspecified   . Special screening for osteoporosis   . Renal cancer 05-01-11    2'03-Right  . Malignant neoplasm of kidney, except pelvis   . Shortness of breath 05-01-11    exertional SOB only  . Arthritis 05-01-11    Right hip osteoarthritis  . Hypertension 05/20/2011    Past Surgical History  Procedure Laterality Date  . Abdominal hysterectomy  1975    for benign causes  . Cardiac catheterization  2006  . Nephrectomy      2'03-RIGHT S/P RENAL CELL CANCER  . Cataract extraction  05-01-11    bil. with lens implant  . Insert / replace / remove pacemaker  05-01-11    02-28-05-/05-04-10-ICD-MEDTRONIC MAXIMAL DR  . Eye surgery  05-01-11    bil. cataract only  . Appendectomy    . Total hip arthroplasty  05/03/2011    Procedure: TOTAL HIP ARTHROPLASTY ANTERIOR APPROACH;  Surgeon: [name];  Location: [facility];  Service: Orthopedics;  Laterality: Right;  Removal of Cannulated Screws Right Hip, Right Direct Anterior Hip Replacement    Family History  Problem Relation Age of Onset  . Colon cancer Neg Hx     Social History History  Substance Use Topics  . Smoking status: Former Smoker -- 0.50 packs/day for 40 years    Types:  Cigarettes    Quit date: 05/06/2001  . Smokeless tobacco: Not on file  . Alcohol Use: No    Allergies  Allergen Reactions  . Codeine     REACTION: N \\T \ V  . Sulfonamide Derivatives     REACTION: Dry mouth    Current Outpatient Prescriptions  Medication Sig Dispense Refill  . aspirin 81 MG EC tablet Take 81 mg by mouth daily after breakfast.       . atorvastatin (LIPITOR) 40 MG tablet Take 1 tablet (40 mg total) by mouth every evening.  30 tablet  6  . carvedilol (COREG) 12.5 MG tablet Take 1 tablet (12.5 mg total) by mouth 2 (two) times daily with a meal.  60 tablet   5  . cetirizine (ZYRTEC) 10 MG tablet Take 10 mg by mouth daily.       . disopyramide (NORPACE) 150 MG capsule Take 1 capsule (150 mg total) by mouth 2 (two) times daily.  60 capsule  6  . furosemide (LASIX) 40 MG tablet Take 1 tablet (40 mg total) by mouth daily.  30 tablet  4  . verapamil (CALAN-SR) 180 MG CR tablet Take 180 mg by mouth at bedtime.       Marland Kitchen warfarin (COUMADIN) 1 MG tablet Take 1 tablet (1 mg total) by mouth as directed. Take as directed by coumadin clinic  70 tablet  2  . [DISCONTINUED] hydrochlorothiazide (HYDRODIURIL) 25 MG tablet Take 25 mg by mouth daily after breakfast.       . [DISCONTINUED] potassium chloride SA (KLOR-CON M20) 20 MEQ tablet Take 20 mEq by mouth daily.        No current facility-administered medications for this visit.    Review of Systems Review of Systems  Constitutional: Negative for fever, chills and unexpected weight change.  HENT: Negative for hearing loss, congestion, sore throat, trouble swallowing and voice change.   Eyes: Negative for visual disturbance.  Respiratory: Negative for cough and wheezing.   Cardiovascular: Negative for chest pain, palpitations and leg swelling.  Gastrointestinal: Positive for nausea, abdominal pain and abdominal distention. Negative for vomiting, diarrhea, constipation, blood in stool and anal bleeding.  Genitourinary: Negative for hematuria, vaginal bleeding and difficulty urinating.  Musculoskeletal: Negative for arthralgias.  Skin: Negative for rash and wound.  Neurological: Negative for seizures, syncope and headaches.  Hematological: Negative for adenopathy. Does not bruise/bleed easily.  Psychiatric/Behavioral: Negative for confusion.    Blood pressure 142/83, pulse 72, temperature 98.6 F (37 C), temperature source Temporal, resp. rate 16, height 5' 7.5" (1.715 m), weight 186 lb (84.369 kg).  Physical Exam Physical Exam WDWN in NAD HEENT:  EOMI, sclera anicteric Neck:  No masses, no  thyromegaly Lungs:  CTA bilaterally; normal respiratory effort CV:  Regular rate and rhythm; no murmurs Abd:  +bowel sounds, soft, tender in epigastrium and RUQ; no palpable masses Ext:  Well-perfused; no edema Skin:  Warm, dry; no sign of jaundice  Data Reviewed U/S from [place] - cholelithiasis, no cholecystitis; no CBD abnormalities Lab Results  Component Value Date   WBC 4.9 09/01/2012   HGB 12.4 09/01/2012   HCT 36.1 09/01/2012   MCV 88.1 09/01/2012   PLT 185.0 09/01/2012   Lab Results  Component Value Date   CREATININE 1.5* 09/01/2012   BUN 21 09/01/2012   NA 142 09/01/2012   K 3.9 09/01/2012   CL 101 09/01/2012   CO2 32 09/01/2012    Lab Results  Component Value Date   ALT 23  09/01/2012   AST 23 09/01/2012   ALKPHOS 60 09/01/2012   BILITOT 0.7 09/01/2012     Assessment    Chronic calculus cholecystitis Significant cardiac history     Plan    Cardiac clearance by Dr. Cristopher Peru - will need to hold Coumadin prior to surgery and will need her pacer/ defibrillator addressed the morning of surgery.  Will perform surgery at [facility].  The surgical procedure has been discussed with the patient.  Potential risks, benefits, alternative treatments, and expected outcomes have been explained.  All of the patient's questions at this time have been answered.  The likelihood of reaching the patient's treatment goal is good.  The patient understand the proposed surgical procedure and wishes to proceed.           Jarret Torre K. 09/15/2012, 12:56 PM

## 2012-09-17 ENCOUNTER — Ambulatory Visit (INDEPENDENT_AMBULATORY_CARE_PROVIDER_SITE_OTHER): Payer: Medicare Other | Admitting: General Practice

## 2012-09-17 DIAGNOSIS — I4891 Unspecified atrial fibrillation: Secondary | ICD-10-CM

## 2012-09-17 DIAGNOSIS — Z5181 Encounter for therapeutic drug level monitoring: Secondary | ICD-10-CM

## 2012-09-17 DIAGNOSIS — Z7901 Long term (current) use of anticoagulants: Secondary | ICD-10-CM

## 2012-09-17 LAB — POCT INR: INR: 2.1

## 2012-09-24 ENCOUNTER — Other Ambulatory Visit: Payer: Self-pay | Admitting: Cardiology

## 2012-09-24 MED ORDER — DISOPYRAMIDE PHOSPHATE 150 MG PO CAPS: 150.0000 mg | ORAL_CAPSULE | Freq: Two times a day (BID) | ORAL | Status: DC

## 2012-10-09 ENCOUNTER — Ambulatory Visit: Payer: Medicare Other | Admitting: Internal Medicine

## 2012-10-09 ENCOUNTER — Ambulatory Visit (INDEPENDENT_AMBULATORY_CARE_PROVIDER_SITE_OTHER): Payer: Medicare Other | Admitting: Internal Medicine

## 2012-10-09 ENCOUNTER — Encounter: Payer: Self-pay | Admitting: Internal Medicine

## 2012-10-09 VITALS — BP 147/76 | HR 72 | Ht 67.5 in | Wt 187.5 lb

## 2012-10-09 DIAGNOSIS — Z95810 Presence of automatic (implantable) cardiac defibrillator: Secondary | ICD-10-CM

## 2012-10-09 DIAGNOSIS — R0602 Shortness of breath: Secondary | ICD-10-CM

## 2012-10-09 DIAGNOSIS — I5042 Chronic combined systolic (congestive) and diastolic (congestive) heart failure: Secondary | ICD-10-CM

## 2012-10-09 DIAGNOSIS — I421 Obstructive hypertrophic cardiomyopathy: Secondary | ICD-10-CM

## 2012-10-09 DIAGNOSIS — I10 Essential (primary) hypertension: Secondary | ICD-10-CM

## 2012-10-09 DIAGNOSIS — I4891 Unspecified atrial fibrillation: Secondary | ICD-10-CM

## 2012-10-09 LAB — ICD DEVICE OBSERVATION
AL AMPLITUDE: 1 mv
AL IMPEDENCE ICD: 456 Ohm
AL THRESHOLD: 1.375 V
ATRIAL PACING ICD: 99.5 pct
BAMS-0001: 170 {beats}/min
BATTERY VOLTAGE: 3.0912 V
CHARGE TIME: 9.339 s
DEV-0020ICD: NEGATIVE
FVT: 0
PACEART VT: 0
RV LEAD AMPLITUDE: 11.5 mv
RV LEAD IMPEDENCE ICD: 513 Ohm
RV LEAD THRESHOLD: 1 V
TOT-0001: 1
TOT-0002: 0
TOT-0006: 20111230000000
TZAT-0001ATACH: 1
TZAT-0001ATACH: 2
TZAT-0001ATACH: 3
TZAT-0001FASTVT: 1
TZAT-0001SLOWVT: 1
TZAT-0001SLOWVT: 2
TZAT-0002ATACH: NEGATIVE
TZAT-0002ATACH: NEGATIVE
TZAT-0002ATACH: NEGATIVE
TZAT-0002FASTVT: NEGATIVE
TZAT-0004SLOWVT: 8
TZAT-0004SLOWVT: 8
TZAT-0005SLOWVT: 84 pct
TZAT-0005SLOWVT: 91 pct
TZAT-0011SLOWVT: 10 ms
TZAT-0011SLOWVT: 10 ms
TZAT-0012ATACH: 150 ms
TZAT-0012ATACH: 150 ms
TZAT-0012ATACH: 150 ms
TZAT-0012FASTVT: 170 ms
TZAT-0012SLOWVT: 170 ms
TZAT-0012SLOWVT: 170 ms
TZAT-0013SLOWVT: 2
TZAT-0013SLOWVT: 2
TZAT-0018ATACH: NEGATIVE
TZAT-0018ATACH: NEGATIVE
TZAT-0018ATACH: NEGATIVE
TZAT-0018FASTVT: NEGATIVE
TZAT-0018SLOWVT: NEGATIVE
TZAT-0018SLOWVT: NEGATIVE
TZAT-0019ATACH: 6 V
TZAT-0019ATACH: 6 V
TZAT-0019ATACH: 6 V
TZAT-0019FASTVT: 8 V
TZAT-0019SLOWVT: 8 V
TZAT-0019SLOWVT: 8 V
TZAT-0020ATACH: 1.5 ms
TZAT-0020ATACH: 1.5 ms
TZAT-0020ATACH: 1.5 ms
TZAT-0020FASTVT: 1.5 ms
TZAT-0020SLOWVT: 1.5 ms
TZAT-0020SLOWVT: 1.5 ms
TZON-0003ATACH: 350 ms
TZON-0003SLOWVT: 350 ms
TZON-0003VSLOWVT: 400 ms
TZON-0004SLOWVT: 16
TZON-0004VSLOWVT: 20
TZON-0005SLOWVT: 12
TZST-0001ATACH: 4
TZST-0001ATACH: 5
TZST-0001ATACH: 6
TZST-0001FASTVT: 2
TZST-0001FASTVT: 3
TZST-0001FASTVT: 4
TZST-0001FASTVT: 5
TZST-0001FASTVT: 6
TZST-0001SLOWVT: 3
TZST-0001SLOWVT: 4
TZST-0001SLOWVT: 5
TZST-0001SLOWVT: 6
TZST-0002ATACH: NEGATIVE
TZST-0002ATACH: NEGATIVE
TZST-0002ATACH: NEGATIVE
TZST-0002FASTVT: NEGATIVE
TZST-0002FASTVT: NEGATIVE
TZST-0002FASTVT: NEGATIVE
TZST-0002FASTVT: NEGATIVE
TZST-0002FASTVT: NEGATIVE
TZST-0003SLOWVT: 25 J
TZST-0003SLOWVT: 35 J
TZST-0003SLOWVT: 35 J
TZST-0003SLOWVT: 35 J
VENTRICULAR PACING ICD: 99.81 pct
VF: 0

## 2012-10-09 NOTE — Assessment & Plan Note (Signed)
Her ICD is working normally. We'll plan to recheck in several months.

## 2012-10-09 NOTE — Progress Notes (Signed)
HPI [name] returns today for followup. She is a 79 year old woman with a nonischemic cardiomyopathy and chronic systolic heart failure. She also is paroxysmal atrial fibrillation which is been well-controlled recently. The patient admits to dietary indiscretion. She is trying to lose weight but found it frustrating. She has not had syncope. No peripheral edema. She has gained weight. No ICD shocks. Allergies  Allergen Reactions  . Codeine     REACTION: N \\T \ V  . Sulfonamide Derivatives     REACTION: Dry mouth     Current Outpatient Prescriptions  Medication Sig Dispense Refill  . aspirin 81 MG EC tablet Take 81 mg by mouth daily after breakfast.       . atorvastatin (LIPITOR) 40 MG tablet Take 1 tablet (40 mg total) by mouth every evening.  30 tablet  6  . carvedilol (COREG) 12.5 MG tablet Take 1 tablet (12.5 mg total) by mouth 2 (two) times daily with a meal.  60 tablet  5  . cetirizine (ZYRTEC) 10 MG tablet Take 10 mg by mouth daily.       . disopyramide (NORPACE) 150 MG capsule Take 1 capsule (150 mg total) by mouth 2 (two) times daily.  60 capsule  6  . furosemide (LASIX) 40 MG tablet Take 1 tablet (40 mg total) by mouth daily.  30 tablet  4  . verapamil (CALAN-SR) 180 MG CR tablet Take 180 mg by mouth at bedtime.       Marland Kitchen warfarin (COUMADIN) 1 MG tablet Take 1 tablet (1 mg total) by mouth as directed. Take as directed by coumadin clinic  70 tablet  2  . [DISCONTINUED] hydrochlorothiazide (HYDRODIURIL) 25 MG tablet Take 25 mg by mouth daily after breakfast.       . [DISCONTINUED] potassium chloride SA (KLOR-CON M20) 20 MEQ tablet Take 20 mEq by mouth daily.        No current facility-administered medications for this visit.     Past Medical History  Diagnosis Date  . Carotid artery stenosis 09/2007    60-79% bilateral (stable)  . Carotid artery stenosis 10/2008    40-59% R 60-79%   . Chest pain, unspecified   . Automatic implantable cardiac defibrillator in situ   . Atrial  fibrillation   . Sinus bradycardia   . Hypotension, unspecified     cardiac cath 2006..nonobstructive CAD 30-40s lesions.Marland KitchenETT 1/09 nondiagnostic due to poor HR response..Right Renal Cancer 2003  . History of hysterectomy 1975    FOR BENIGN CAUSES  . Adjustment disorder with anxiety   . Blood in stool   . Secondary cardiomyopathy, unspecified   . Chronic airway obstruction, not elsewhere classified   . Long term (current) use of anticoagulants   . Dysuria   . Edema   . Encounter for therapeutic drug monitoring   . Other malaise and fatigue   . Hypertrophic obstructive cardiomyopathy   . Other and unspecified hyperlipidemia   . Urge incontinence   . Other acute sinusitis   . Other and unspecified coagulation defects   . Other screening mammogram   . Pre-operative cardiovascular examination   . Hemorrhage of rectum and anus   . Chronic kidney disease, unspecified   . Special screening for osteoporosis   . Renal cancer 05-01-11    2'03-Right  . Malignant neoplasm of kidney, except pelvis   . Shortness of breath 05-01-11    exertional SOB only  . Arthritis 05-01-11    Right hip osteoarthritis  . Hypertension 05/20/2011  ROS:   All systems reviewed and negative except as noted in the HPI.   Past Surgical History  Procedure Laterality Date  . Abdominal hysterectomy  1975    for benign causes  . Cardiac catheterization  2006  . Nephrectomy      2'03-RIGHT S/P RENAL CELL CANCER  . Cataract extraction  05-01-11    bil. with lens implant  . Insert / replace / remove pacemaker  05-01-11    02-28-05-/05-04-10-ICD-MEDTRONIC MAXIMAL DR  . Eye surgery  05-01-11    bil. cataract only  . Appendectomy    . Total hip arthroplasty  05/03/2011    Procedure: TOTAL HIP ARTHROPLASTY ANTERIOR APPROACH;  Surgeon: Mcarthur Rossetti;  Location: [facility];  Service: Orthopedics;  Laterality: Right;  Removal of Cannulated Screws Right Hip, Right Direct Anterior Hip Replacement      Family History  Problem Relation Age of Onset  . Colon cancer Neg Hx      History   Social History  . Marital Status: Widowed    Spouse Name: N/A    Number of Children: N/A  . Years of Education: N/A   Occupational History  . Retired    Social History Main Topics  . Smoking status: Former Smoker -- 0.50 packs/day for 40 years    Types: Cigarettes    Quit date: 05/06/2001  . Smokeless tobacco: Not on file  . Alcohol Use: No  . Drug Use: No  . Sexually Active: No   Other Topics Concern  . 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   ALCOHOL USE-NO   RETIRED   FORMER TOBACCO USE.....1/2 PACK X 40 YRS UNTIL 2003     BP 147/76  Pulse 72  Ht 5' 7.5" (1.715 m)  Wt 187 lb 8 oz (85.049 kg)  BMI 28.92 kg/m2  Physical Exam:  Well appearing 79 year old woman,NAD HEENT: Unremarkable Neck:  7 cm JVD, no thyromegally Lungs:  Clear with no wheezes, rales, or rhonchi. HEART:  Regular rate rhythm, grade 1/6 systolic murmurs, no rubs, no clicks Abd:  soft, positive bowel sounds, no organomegally, no rebound, no guarding Ext:  2 plus pulses, no edema, no cyanosis, no clubbing Skin:  No rashes no nodules Neuro:  CN II through XII intact, motor grossly intact  EKG - normal sinus rhythm with AV sequential pacing  DEVICE  Normal device function.  See PaceArt for details.   Assess/Plan:

## 2012-10-09 NOTE — Patient Instructions (Addendum)
Your physician wants you to follow-up in: 3 months with device clinic and 1 year with Dr. Lovena Le. You will receive a reminder letter in the mail two months in advance. If you don't receive a letter, please call our office to schedule the follow-up appointment.

## 2012-10-09 NOTE — Assessment & Plan Note (Addendum)
She is maintaining sinus rhythm very nicely. She'll continue her anticoagulation and her Norpace. She will also continue verapamil. She has had no atrial fibrillation since January.

## 2012-10-09 NOTE — Assessment & Plan Note (Signed)
Her blood pressure is well controlled. She is encouraged to reduce her salt intake, and lose weight. I've asked the patient to increase her physical activity.

## 2012-10-12 ENCOUNTER — Telehealth: Payer: Self-pay | Admitting: Internal Medicine

## 2012-10-12 ENCOUNTER — Telehealth (INDEPENDENT_AMBULATORY_CARE_PROVIDER_SITE_OTHER): Payer: Self-pay | Admitting: *Deleted

## 2012-10-12 NOTE — Telephone Encounter (Signed)
Called patient back to let her know that I will call Dr Lovena Le office to get cardiac clearance for surgery and once I do get clearance I will call her back and set her orders to the schedulers. And she is ok with that.

## 2012-10-12 NOTE — Telephone Encounter (Signed)
Patient called to state she saw her cardiologist on 10/09/12 who patient states was ok with her proceeding with surgery.  Patient is anxious to go ahead and get surgery going since she already had to wait a month on her cardiologist.

## 2012-10-12 NOTE — Telephone Encounter (Signed)
New problem    Need cardiac clearance for gallbladder(fax #) [phone]

## 2012-10-13 NOTE — Telephone Encounter (Signed)
Discussed with Dr Lovena Le, patient low risk and may proceed with surgery

## 2012-10-15 ENCOUNTER — Telehealth: Payer: Self-pay | Admitting: General Practice

## 2012-10-15 NOTE — Telephone Encounter (Signed)
Message copied by Warden Fillers on Thu Oct 15, 2012  8:10 AM ------      Message from: Evans Lance      Created: Wed Oct 14, 2012 10:20 PM       No indication for lovenox bridge. GT      ----- Message -----         From: Warden Fillers, RN         Sent: 10/12/2012   1:49 PM           To: Evans Lance, MD            This patient has upcoming gallbladder surgery.  Do you pt to have a lovenox bridge?             Thanks,      Villa Herb, RN      [facility] [place]      Coumadin Clinic       ------

## 2012-10-15 NOTE — Telephone Encounter (Signed)
Informed patient that lovenox will not be needed for upcoming gallbladder surgery per Dr. Lovena Le.  See note dated 6/12.

## 2012-10-27 ENCOUNTER — Encounter (HOSPITAL_COMMUNITY): Payer: Self-pay | Admitting: Pharmacy Technician

## 2012-10-29 ENCOUNTER — Ambulatory Visit (INDEPENDENT_AMBULATORY_CARE_PROVIDER_SITE_OTHER): Payer: Medicare Other | Admitting: Family Medicine

## 2012-10-29 DIAGNOSIS — Z5181 Encounter for therapeutic drug level monitoring: Secondary | ICD-10-CM

## 2012-10-29 DIAGNOSIS — Z7901 Long term (current) use of anticoagulants: Secondary | ICD-10-CM

## 2012-10-29 DIAGNOSIS — I4891 Unspecified atrial fibrillation: Secondary | ICD-10-CM

## 2012-10-29 LAB — POCT INR: INR: 2

## 2012-10-29 NOTE — Patient Instructions (Addendum)
Continue to take 2 mg all days except 2.5 mg on Wednesday.  Re-check in 2 weeks.  7/4- last dose of Coumadin 7/5- NO Coumadin 7/6- NO Coumadin 7/7-NO Coumadin 7/8- NO Coumadin 7/9- Procedure Day (NO Coumadin) 7/10- 1.5 tablets Coumadin  7/11- 1.5 tablets Coumadin 7/12- 1 tablet Coumadin 7/13- 1 tablet Coumadin 7/14- Recheck level (do not take Coumadin before appt)

## 2012-11-04 ENCOUNTER — Encounter (HOSPITAL_COMMUNITY): Payer: Self-pay

## 2012-11-04 ENCOUNTER — Encounter (HOSPITAL_COMMUNITY)
Admission: RE | Admit: 2012-11-04 | Discharge: 2012-11-04 | Disposition: A | Discharge status: Home / Self Care | Payer: Medicare Other | Source: Ambulatory Visit | Attending: Anesthesiology | Admitting: Anesthesiology

## 2012-11-04 ENCOUNTER — Encounter (HOSPITAL_COMMUNITY)
Admission: RE | Admit: 2012-11-04 | Discharge: 2012-11-04 | Disposition: A | Discharge status: Home / Self Care | Payer: Medicare Other | Source: Ambulatory Visit | Attending: Surgery | Admitting: Surgery

## 2012-11-04 DIAGNOSIS — Z01812 Encounter for preprocedural laboratory examination: Secondary | ICD-10-CM | POA: Insufficient documentation

## 2012-11-04 DIAGNOSIS — Z01818 Encounter for other preprocedural examination: Secondary | ICD-10-CM | POA: Insufficient documentation

## 2012-11-04 HISTORY — DX: Nausea with vomiting, unspecified: Z98.890

## 2012-11-04 HISTORY — DX: Nausea with vomiting, unspecified: R11.2

## 2012-11-04 HISTORY — DX: Atherosclerotic heart disease of native coronary artery without angina pectoris: I25.10

## 2012-11-04 LAB — CBC
HCT: 35.6 % — ABNORMAL LOW (ref 36.0–46.0)
Hemoglobin: 11.6 g/dL — ABNORMAL LOW (ref 12.0–15.0)
MCH: 28.9 pg (ref 26.0–34.0)
MCHC: 32.6 g/dL (ref 30.0–36.0)
MCV: 88.8 fL (ref 78.0–100.0)
Platelets: 170 10*3/uL (ref 150–400)
RBC: 4.01 MIL/uL (ref 3.87–5.11)
RDW: 14.9 % (ref 11.5–15.5)
WBC: 4.5 10*3/uL (ref 4.0–10.5)

## 2012-11-04 LAB — BASIC METABOLIC PANEL
BUN: 20 mg/dL (ref 6–23)
CO2: 29 mEq/L (ref 19–32)
Calcium: 9.4 mg/dL (ref 8.4–10.5)
Chloride: 100 mEq/L (ref 96–112)
Creatinine, Ser: 1.71 mg/dL — ABNORMAL HIGH (ref 0.50–1.10)
GFR calc Af Amer: 32 mL/min — ABNORMAL LOW (ref >90)
GFR calc non Af Amer: 28 mL/min — ABNORMAL LOW (ref >90)
Glucose, Bld: 83 mg/dL (ref 70–99)
Potassium: 4.2 mEq/L (ref 3.5–5.1)
Sodium: 139 mEq/L (ref 135–145)

## 2012-11-04 LAB — APTT: aPTT: 39 seconds — ABNORMAL HIGH (ref 24–37)

## 2012-11-04 LAB — PROTIME-INR
INR: 1.87 — ABNORMAL HIGH (ref 0.00–1.49)
Prothrombin Time: 21 seconds — ABNORMAL HIGH (ref 11.6–15.2)

## 2012-11-04 MED ORDER — CHLORHEXIDINE GLUCONATE 4 % EX LIQD: 1.0000 "application " | Freq: Once | CUTANEOUS | Status: DC

## 2012-11-04 NOTE — Progress Notes (Signed)
PT WILL DISCONTINUE COUMADIN 11/06/2012, REPEAT PT DOS PER ANESTHESIA

## 2012-11-04 NOTE — Pre-Procedure Instructions (Signed)
[name]  11/04/2012   Your procedure is scheduled on:  November 11, 2012  Report to [facility] at 9 AM.  Call this number if you have problems the morning of surgery: [phone]   Remember:  DISCONTINUE ASPIRIN, COUMADIN, EFFIENT, PLAVIX, AND HERBAL MEDICATIONS 5 DAYS PRIOR TO SURGERY   Do not eat food or drink liquids after midnight.   Take these medicines the morning of surgery with A SIP OF WATER: carvedilol (COREG) 12.5 MG tablet, disopyramide (NORPACE)  150 MG capsule     Do not wear jewelry, make-up or nail polish.  Do not wear lotions, powders, or perfumes. You may wear deodorant.  Do not shave 48 hours prior to surgery. Men may shave face and neck.  Do not bring valuables to the hospital.  [facility] is not responsible                   for any belongings or valuables.  Contacts, dentures or bridgework may not be worn into surgery.  Leave suitcase in the car. After surgery it may be brought to your room.  For patients admitted to the hospital, checkout time is 11:00 AM the day of  discharge.   Patients discharged the day of surgery will not be allowed to drive  home.  Name and phone number of your driver: FAMILY/FRIEND  Special Instructions: Shower using CHG 2 nights before surgery and the night before surgery.  If you shower the day of surgery use CHG.  Use special wash - you have one bottle of CHG for all showers.  You should use approximately 1/3 of the bottle for each shower.   Please read over the following fact sheets that you were given: Pain Booklet, Coughing and Deep Breathing and Surgical Site Infection Prevention

## 2012-11-05 ENCOUNTER — Encounter (HOSPITAL_COMMUNITY): Payer: Self-pay

## 2012-11-05 NOTE — Progress Notes (Signed)
Anesthesia Chart Review:  Patient is a 79 year old female scheduled for laparoscopic cholecystectomy on 11/11/12 by Dr. Georgette Dover.  History includes former smoker, postoperative nausea and vomiting, paroxysmal atrial fibrillation, chronic chest pain with non-obstructive CAD by 2006 cath, bradycardia, nonischemic cardiomyopathy, chronic systolic heart failure, s/p Medtronic dual-chamber AICD 02/28/05, carotid artery stenosis, chronic kidney disease with history renal cancer status post right nephrectomy '03, COPD, hypertension, hyperlipidemia, anxiety, right THA '12.  PCP is Dr. [name].  Cardiologists are Dr. Arlan Organ. Rockey Situ.  Dr. Lovena Le is aware of plans for surgery and did not recommend a Lovenox bridge.  Patient to hold her Coumadin on 11/06/12 and will get a repeat PT/PTT on the day of surgery.  Cr 1.71, with previous baseline ~ 1.5.    EKG on 10/09/12 showed AV paced rhythm.  Nuclear stress test on 08/03/09 showed mild apical thinning but no sign of scar or ischemia, EF 82%.  Echo on 08/03/09 showed: - Left ventricle: The cavity size was normal. There was moderate focal basal hypertrophy. Systolic function was normal. The estimated ejection fraction was in the range of 55% to 60%. There was no dynamic obstruction. Wall motion was normal; there were no regional wall motion abnormalities. Doppler parameters are consistent with high ventricular filling pressure. - Mitral valve: Mild to moderate regurgitation. - Left atrium: The atrium was mildly dilated. - Right atrium: The atrium was mildly dilated. - Tricuspid valve: Moderate regurgitation. - Pulmonary arteries: Systolic pressure was mildly to moderately increased. PA peak pressure: 47mm Hg (S).  Cardiac cath on 02/25/05 showed non-obstructive CAD (40% proximal LAD, 30% mid LAD, 40% mid LCx, 30% proximal RCA, 40% mid RCA), mild pulmonary HTN.  Carotid duplex on [id number] showed [id number] LICAS, [id number] LICAS. Antegrade vertebral artery flow.  CXR on  11/04/12 showed no active disease, cardiomegaly, bilateral calcified granulomas.  If follow-up labs are reasonable and no acute changes then anticipate that she can proceed as planned.  [facility]/Anesthesiology Phone [phone] 11/05/2012 1:18 PM

## 2012-11-10 MED ORDER — CEFAZOLIN SODIUM-DEXTROSE 2-3 GM-% IV SOLR
2.0000 g | INTRAVENOUS | Status: AC
  Administered 2012-11-11: 2 g via INTRAVENOUS
  Filled 2012-11-10: qty 50

## 2012-11-11 ENCOUNTER — Encounter (HOSPITAL_COMMUNITY): Payer: Self-pay | Admitting: Anesthesiology

## 2012-11-11 ENCOUNTER — Encounter (HOSPITAL_COMMUNITY): Payer: Self-pay | Admitting: Vascular Surgery

## 2012-11-11 ENCOUNTER — Observation Stay (HOSPITAL_COMMUNITY)
Admission: RE | Admit: 2012-11-11 | Discharge: 2012-11-13 | Disposition: A | Discharge status: Home / Self Care | Payer: Medicare Other | Source: Ambulatory Visit | Attending: Surgery | Admitting: Surgery

## 2012-11-11 ENCOUNTER — Encounter (HOSPITAL_COMMUNITY)
Admission: RE | Disposition: A | Discharge status: Home / Self Care | Payer: Self-pay | Source: Ambulatory Visit | Attending: Surgery

## 2012-11-11 ENCOUNTER — Ambulatory Visit (HOSPITAL_COMMUNITY): Payer: Medicare Other | Admitting: Anesthesiology

## 2012-11-11 ENCOUNTER — Ambulatory Visit (HOSPITAL_COMMUNITY): Payer: Medicare Other

## 2012-11-11 DIAGNOSIS — Z7901 Long term (current) use of anticoagulants: Secondary | ICD-10-CM | POA: Insufficient documentation

## 2012-11-11 DIAGNOSIS — I2510 Atherosclerotic heart disease of native coronary artery without angina pectoris: Secondary | ICD-10-CM | POA: Insufficient documentation

## 2012-11-11 DIAGNOSIS — K8010 Calculus of gallbladder with chronic cholecystitis without obstruction: Principal | ICD-10-CM | POA: Insufficient documentation

## 2012-11-11 DIAGNOSIS — I10 Essential (primary) hypertension: Secondary | ICD-10-CM | POA: Insufficient documentation

## 2012-11-11 DIAGNOSIS — Z7982 Long term (current) use of aspirin: Secondary | ICD-10-CM | POA: Insufficient documentation

## 2012-11-11 DIAGNOSIS — K660 Peritoneal adhesions (postprocedural) (postinfection): Secondary | ICD-10-CM | POA: Insufficient documentation

## 2012-11-11 DIAGNOSIS — Z79899 Other long term (current) drug therapy: Secondary | ICD-10-CM | POA: Insufficient documentation

## 2012-11-11 HISTORY — PX: LAPAROSCOPIC LYSIS OF ADHESIONS: SHX5905

## 2012-11-11 HISTORY — DX: Angina pectoris, unspecified: I20.9

## 2012-11-11 HISTORY — DX: Unilateral primary osteoarthritis, right hip: M16.11

## 2012-11-11 HISTORY — DX: Presence of automatic (implantable) cardiac defibrillator: Z95.810

## 2012-11-11 HISTORY — DX: Personal history of other medical treatment: Z92.89

## 2012-11-11 HISTORY — DX: Shortness of breath: R06.02

## 2012-11-11 HISTORY — DX: Urinary tract infection, site not specified: N39.0

## 2012-11-11 HISTORY — PX: LAPAROSCOPIC CHOLECYSTECTOMY: SUR755

## 2012-11-11 HISTORY — DX: Pure hypercholesterolemia, unspecified: E78.00

## 2012-11-11 HISTORY — PX: CHOLECYSTECTOMY: SHX55

## 2012-11-11 HISTORY — DX: Unspecified chronic bronchitis: J42

## 2012-11-11 LAB — CBC
HCT: 34.9 % — ABNORMAL LOW (ref 36.0–46.0)
Hemoglobin: 11.3 g/dL — ABNORMAL LOW (ref 12.0–15.0)
MCH: 29 pg (ref 26.0–34.0)
MCHC: 32.4 g/dL (ref 30.0–36.0)
MCV: 89.5 fL (ref 78.0–100.0)
Platelets: 170 10*3/uL (ref 150–400)
RBC: 3.9 MIL/uL (ref 3.87–5.11)
RDW: 15.4 % (ref 11.5–15.5)
WBC: 8.5 10*3/uL (ref 4.0–10.5)

## 2012-11-11 LAB — CREATININE, SERUM
Creatinine, Ser: 1.43 mg/dL — ABNORMAL HIGH (ref 0.50–1.10)
GFR calc Af Amer: 40 mL/min — ABNORMAL LOW (ref >90)
GFR calc non Af Amer: 35 mL/min — ABNORMAL LOW (ref >90)

## 2012-11-11 SURGERY — LAPAROSCOPIC CHOLECYSTECTOMY WITH INTRAOPERATIVE CHOLANGIOGRAM
Anesthesia: General | Site: Abdomen | Wound class: Contaminated

## 2012-11-11 MED ORDER — FENTANYL CITRATE 0.05 MG/ML IJ SOLN
INTRAMUSCULAR | Status: DC | PRN
  Administered 2012-11-11 (×3): 50 ug via INTRAVENOUS
  Administered 2012-11-11: 100 ug via INTRAVENOUS

## 2012-11-11 MED ORDER — BUPIVACAINE-EPINEPHRINE 0.25% -1:200000 IJ SOLN
INTRAMUSCULAR | Status: DC | PRN
  Administered 2012-11-11: 14 mL

## 2012-11-11 MED ORDER — ENOXAPARIN SODIUM 40 MG/0.4ML ~~LOC~~ SOLN
40.0000 mg | SUBCUTANEOUS | Status: DC
  Administered 2012-11-12 – 2012-11-13 (×2): 40 mg via SUBCUTANEOUS
  Filled 2012-11-11 (×2): qty 0.4

## 2012-11-11 MED ORDER — ONDANSETRON HCL 4 MG PO TABS: 4.0000 mg | ORAL_TABLET | Freq: Four times a day (QID) | ORAL | Status: DC | PRN

## 2012-11-11 MED ORDER — OXYCODONE HCL 5 MG/5ML PO SOLN: 5.0000 mg | Freq: Once | ORAL | Status: DC | PRN

## 2012-11-11 MED ORDER — OXYCODONE-ACETAMINOPHEN 5-325 MG PO TABS: 1.0000 | ORAL_TABLET | ORAL | Status: DC | PRN

## 2012-11-11 MED ORDER — SODIUM CHLORIDE 0.9 % IV SOLN
INTRAVENOUS | Status: DC | PRN
  Administered 2012-11-11: 13:00:00

## 2012-11-11 MED ORDER — IBUPROFEN 600 MG PO TABS: 600.0000 mg | ORAL_TABLET | Freq: Four times a day (QID) | ORAL | Status: DC | PRN

## 2012-11-11 MED ORDER — DISOPYRAMIDE PHOSPHATE 150 MG PO CAPS
150.0000 mg | ORAL_CAPSULE | Freq: Two times a day (BID) | ORAL | Status: DC
  Administered 2012-11-11 – 2012-11-13 (×4): 150 mg via ORAL
  Filled 2012-11-11 (×6): qty 1

## 2012-11-11 MED ORDER — METOCLOPRAMIDE HCL 5 MG/ML IJ SOLN
10.0000 mg | Freq: Once | INTRAMUSCULAR | Status: AC | PRN
  Administered 2012-11-11: 10 mg via INTRAVENOUS

## 2012-11-11 MED ORDER — ONDANSETRON HCL 4 MG/2ML IJ SOLN
4.0000 mg | Freq: Four times a day (QID) | INTRAMUSCULAR | Status: DC | PRN
  Administered 2012-11-11 – 2012-11-13 (×4): 4 mg via INTRAVENOUS
  Filled 2012-11-11 (×4): qty 2

## 2012-11-11 MED ORDER — SODIUM CHLORIDE 0.9 % IR SOLN
Status: DC | PRN
  Administered 2012-11-11 (×2): 1000 mL

## 2012-11-11 MED ORDER — ROCURONIUM BROMIDE 100 MG/10ML IV SOLN
INTRAVENOUS | Status: DC | PRN
  Administered 2012-11-11: 10 mg via INTRAVENOUS
  Administered 2012-11-11 (×2): 5 mg via INTRAVENOUS
  Administered 2012-11-11: 40 mg via INTRAVENOUS
  Administered 2012-11-11: 5 mg via INTRAVENOUS

## 2012-11-11 MED ORDER — HYDROMORPHONE HCL PF 1 MG/ML IJ SOLN
0.2500 mg | INTRAMUSCULAR | Status: DC | PRN
  Administered 2012-11-11 (×2): 0.5 mg via INTRAVENOUS

## 2012-11-11 MED ORDER — PROPOFOL 10 MG/ML IV BOLUS
INTRAVENOUS | Status: DC | PRN
  Administered 2012-11-11: 50 mg via INTRAVENOUS
  Administered 2012-11-11: 150 mg via INTRAVENOUS

## 2012-11-11 MED ORDER — LACTATED RINGERS IV SOLN
INTRAVENOUS | Status: DC
  Administered 2012-11-11 – 2012-11-13 (×3): via INTRAVENOUS

## 2012-11-11 MED ORDER — GLYCOPYRROLATE 0.2 MG/ML IJ SOLN
INTRAMUSCULAR | Status: DC | PRN
  Administered 2012-11-11: .8 mg via INTRAVENOUS

## 2012-11-11 MED ORDER — ONDANSETRON HCL 4 MG/2ML IJ SOLN
4.0000 mg | Freq: Once | INTRAMUSCULAR | Status: AC
  Administered 2012-11-11: 4 mg via INTRAVENOUS

## 2012-11-11 MED ORDER — SODIUM CHLORIDE 0.9 % IR SOLN
Status: DC | PRN
  Administered 2012-11-11: 1000 mL

## 2012-11-11 MED ORDER — ONDANSETRON HCL 4 MG/2ML IJ SOLN
INTRAMUSCULAR | Status: DC | PRN
  Administered 2012-11-11 (×2): 4 mg via INTRAVENOUS

## 2012-11-11 MED ORDER — NEOSTIGMINE METHYLSULFATE 1 MG/ML IJ SOLN
INTRAMUSCULAR | Status: DC | PRN
  Administered 2012-11-11: 5 mg via INTRAVENOUS

## 2012-11-11 MED ORDER — FUROSEMIDE 40 MG PO TABS
40.0000 mg | ORAL_TABLET | Freq: Every day | ORAL | Status: DC
  Administered 2012-11-12: 40 mg via ORAL
  Filled 2012-11-11 (×2): qty 1

## 2012-11-11 MED ORDER — OXYCODONE HCL 5 MG PO TABS: 5.0000 mg | ORAL_TABLET | Freq: Once | ORAL | Status: DC | PRN

## 2012-11-11 MED ORDER — ONDANSETRON HCL 4 MG/2ML IJ SOLN
INTRAMUSCULAR | Status: AC
  Filled 2012-11-11: qty 2

## 2012-11-11 MED ORDER — METOCLOPRAMIDE HCL 5 MG/ML IJ SOLN
INTRAMUSCULAR | Status: DC | PRN
  Administered 2012-11-11: 10 mg via INTRAVENOUS

## 2012-11-11 MED ORDER — ATORVASTATIN CALCIUM 40 MG PO TABS
40.0000 mg | ORAL_TABLET | Freq: Every evening | ORAL | Status: DC
Start: 2012-11-11 — End: 2012-11-13
  Administered 2012-11-12: 40 mg via ORAL
  Filled 2012-11-11 (×3): qty 1

## 2012-11-11 MED ORDER — BUPIVACAINE-EPINEPHRINE PF 0.25-1:200000 % IJ SOLN
INTRAMUSCULAR | Status: AC
  Filled 2012-11-11: qty 30

## 2012-11-11 MED ORDER — CARVEDILOL 12.5 MG PO TABS
12.5000 mg | ORAL_TABLET | Freq: Two times a day (BID) | ORAL | Status: DC
  Administered 2012-11-12 – 2012-11-13 (×3): 12.5 mg via ORAL
  Filled 2012-11-11 (×6): qty 1

## 2012-11-11 MED ORDER — LIDOCAINE HCL (CARDIAC) 20 MG/ML IV SOLN
INTRAVENOUS | Status: DC | PRN
  Administered 2012-11-11: 80 mg via INTRAVENOUS

## 2012-11-11 MED ORDER — VERAPAMIL HCL ER 180 MG PO TBCR
180.0000 mg | EXTENDED_RELEASE_TABLET | Freq: Every day | ORAL | Status: DC
  Administered 2012-11-11 – 2012-11-12 (×2): 180 mg via ORAL
  Filled 2012-11-11 (×4): qty 1

## 2012-11-11 MED ORDER — LACTATED RINGERS IV SOLN
INTRAVENOUS | Status: DC | PRN
  Administered 2012-11-11 (×2): via INTRAVENOUS

## 2012-11-11 MED ORDER — FENTANYL CITRATE 0.05 MG/ML IJ SOLN: 50.0000 ug | INTRAMUSCULAR | Status: DC | PRN

## 2012-11-11 MED ORDER — POTASSIUM CHLORIDE CRYS ER 10 MEQ PO TBCR
10.0000 meq | EXTENDED_RELEASE_TABLET | Freq: Two times a day (BID) | ORAL | Status: DC
  Administered 2012-11-12 (×2): 10 meq via ORAL
  Filled 2012-11-11 (×5): qty 1

## 2012-11-11 MED ORDER — DEXAMETHASONE SODIUM PHOSPHATE 10 MG/ML IJ SOLN
INTRAMUSCULAR | Status: DC | PRN
  Administered 2012-11-11: 8 mg via INTRAVENOUS

## 2012-11-11 MED ORDER — HYDROMORPHONE HCL PF 1 MG/ML IJ SOLN
INTRAMUSCULAR | Status: AC
  Filled 2012-11-11: qty 1

## 2012-11-11 SURGICAL SUPPLY — 53 items
APPLIER CLIP ROT 10 11.4 M/L (STAPLE) ×2
BENZOIN TINCTURE PRP APPL 2/3 (GAUZE/BANDAGES/DRESSINGS) ×2 IMPLANT
BLADE SURG ROTATE 9660 (MISCELLANEOUS) IMPLANT
CANISTER SUCTION 2500CC (MISCELLANEOUS) ×2 IMPLANT
CHLORAPREP W/TINT 26ML (MISCELLANEOUS) ×2 IMPLANT
CLIP APPLIE ROT 10 11.4 M/L (STAPLE) ×1 IMPLANT
CLOTH BEACON ORANGE TIMEOUT ST (SAFETY) ×2 IMPLANT
COVER MAYO STAND STRL (DRAPES) ×2 IMPLANT
COVER SURGICAL LIGHT HANDLE (MISCELLANEOUS) ×2 IMPLANT
DECANTER SPIKE VIAL GLASS SM (MISCELLANEOUS) ×4 IMPLANT
DRAIN CHANNEL 19F RND (DRAIN) ×2 IMPLANT
DRAPE C-ARM 42X72 X-RAY (DRAPES) ×2 IMPLANT
DRAPE UTILITY 15X26 W/TAPE STR (DRAPE) ×4 IMPLANT
DRSG TEGADERM 4X4.75 (GAUZE/BANDAGES/DRESSINGS) ×2 IMPLANT
ELECT REM PT RETURN 9FT ADLT (ELECTROSURGICAL) ×2
ELECTRODE REM PT RTRN 9FT ADLT (ELECTROSURGICAL) ×1 IMPLANT
EVACUATOR SILICONE 100CC (DRAIN) ×2 IMPLANT
FILTER SMOKE EVAC LAPAROSHD (FILTER) ×2 IMPLANT
GAUZE SPONGE 2X2 8PLY STRL LF (GAUZE/BANDAGES/DRESSINGS) ×1 IMPLANT
GLOVE BIO SURGEON STRL SZ7 (GLOVE) ×2 IMPLANT
GLOVE BIO SURGEON STRL SZ7.5 (GLOVE) ×6 IMPLANT
GLOVE BIO SURGEON STRL SZ8 (GLOVE) ×2 IMPLANT
GLOVE BIOGEL PI IND STRL 7.0 (GLOVE) ×3 IMPLANT
GLOVE BIOGEL PI IND STRL 7.5 (GLOVE) ×3 IMPLANT
GLOVE BIOGEL PI IND STRL 8 (GLOVE) ×1 IMPLANT
GLOVE BIOGEL PI INDICATOR 7.0 (GLOVE) ×3
GLOVE BIOGEL PI INDICATOR 7.5 (GLOVE) ×3
GLOVE BIOGEL PI INDICATOR 8 (GLOVE) ×1
GLOVE SURG SS PI 7.0 STRL IVOR (GLOVE) ×4 IMPLANT
GOWN STRL NON-REIN LRG LVL3 (GOWN DISPOSABLE) ×8 IMPLANT
GOWN STRL REIN XL XLG (GOWN DISPOSABLE) ×4 IMPLANT
KIT BASIN OR (CUSTOM PROCEDURE TRAY) ×2 IMPLANT
KIT ROOM TURNOVER OR (KITS) ×2 IMPLANT
NS IRRIG 1000ML POUR BTL (IV SOLUTION) ×2 IMPLANT
PAD ARMBOARD 7.5X6 YLW CONV (MISCELLANEOUS) ×2 IMPLANT
POUCH SPECIMEN RETRIEVAL 10MM (ENDOMECHANICALS) ×2 IMPLANT
SCISSORS LAP 5X35 DISP (ENDOMECHANICALS) IMPLANT
SET CHOLANGIOGRAPH 5 50 .035 (SET/KITS/TRAYS/PACK) ×2 IMPLANT
SET IRRIG TUBING LAPAROSCOPIC (IRRIGATION / IRRIGATOR) ×2 IMPLANT
SLEEVE ENDOPATH XCEL 5M (ENDOMECHANICALS) ×2 IMPLANT
SLEEVE SURGEON STRL (DRAPES) ×2 IMPLANT
SPECIMEN JAR SMALL (MISCELLANEOUS) ×2 IMPLANT
SPONGE GAUZE 2X2 STER 10/PKG (GAUZE/BANDAGES/DRESSINGS) ×1
SPONGE GAUZE 4X4 12PLY (GAUZE/BANDAGES/DRESSINGS) ×2 IMPLANT
SUT ETHILON 2 0 FS 18 (SUTURE) ×2 IMPLANT
SUT MNCRL AB 4-0 PS2 18 (SUTURE) ×2 IMPLANT
TAPE CLOTH SURG 6X10 WHT LF (GAUZE/BANDAGES/DRESSINGS) ×2 IMPLANT
TOWEL OR 17X24 6PK STRL BLUE (TOWEL DISPOSABLE) ×2 IMPLANT
TOWEL OR 17X26 10 PK STRL BLUE (TOWEL DISPOSABLE) ×2 IMPLANT
TRAY LAPAROSCOPIC (CUSTOM PROCEDURE TRAY) ×2 IMPLANT
TROCAR XCEL BLUNT TIP 100MML (ENDOMECHANICALS) ×2 IMPLANT
TROCAR XCEL NON-BLD 11X100MML (ENDOMECHANICALS) ×2 IMPLANT
TROCAR XCEL NON-BLD 5MMX100MML (ENDOMECHANICALS) ×2 IMPLANT

## 2012-11-11 NOTE — Progress Notes (Signed)
Medtronic called to turn on ICD

## 2012-11-11 NOTE — H&P (Signed)
Abdominal Pain     gallbladder   HPI  [name] is a 79 y.o. female. Referred by Dr. [name] for evaluation of gallbladder disease  Abdominal Pain  Associated symptoms include nausea. Pertinent negatives include no arthralgias, constipation, diarrhea, fever, headaches, hematuria or vomiting.  This is a 79 year old female with a complex past medical surgical history who presents with about a month of intermittent upper abdominal pain with radiation around her right side to her back. This initially started after eating some pizza. This was associated with some bloating but no nausea or vomiting. She denies any diarrhea. She continues to have some milder episodes. Ultrasound showed cholelithiasis but no sign of cholecystitis. Common bile duct was normal. She is now referred for surgical evaluation and management.  Past Medical History   Diagnosis  Date   .  Carotid artery stenosis  09/2007     60-79% bilateral (stable)   .  Carotid artery stenosis  10/2008     40-59% R 60-79%   .  Chest pain, unspecified    .  Automatic implantable cardiac defibrillator in situ    .  Atrial fibrillation    .  Sinus bradycardia    .  Hypotension, unspecified      cardiac cath 2006..nonobstructive CAD 30-40s lesions.Marland KitchenETT 1/09 nondiagnostic due to poor HR response..Right Renal Cancer 2003   .  History of hysterectomy  1975     FOR BENIGN CAUSES   .  Adjustment disorder with anxiety    .  Blood in stool    .  Secondary cardiomyopathy, unspecified    .  Chronic airway obstruction, not elsewhere classified    .  Long term (current) use of anticoagulants    .  Dysuria    .  Edema    .  Encounter for therapeutic drug monitoring    .  Other malaise and fatigue    .  Hypertrophic obstructive cardiomyopathy    .  Other and unspecified hyperlipidemia    .  Urge incontinence    .  Other acute sinusitis    .  Other and unspecified coagulation defects    .  Other screening mammogram    .  Pre-operative  cardiovascular examination    .  Hemorrhage of rectum and anus    .  Chronic kidney disease, unspecified    .  Special screening for osteoporosis    .  Renal cancer  05-01-11     2'03-Right   .  Malignant neoplasm of kidney, except pelvis    .  Shortness of breath  05-01-11     exertional SOB only   .  Arthritis  05-01-11     Right hip osteoarthritis   .  Hypertension  05/20/2011    Past Surgical History   Procedure  Laterality  Date   .  Abdominal hysterectomy   1975     for benign causes   .  Cardiac catheterization   2006   .  Nephrectomy       2'03-RIGHT S/P RENAL CELL CANCER   .  Cataract extraction   05-01-11     bil. with lens implant   .  Insert / replace / remove pacemaker   05-01-11     02-28-05-/05-04-10-ICD-MEDTRONIC MAXIMAL DR   .  Eye surgery   05-01-11     bil. cataract only   .  Appendectomy     .  Total hip arthroplasty  05/03/2011     Procedure: TOTAL HIP ARTHROPLASTY ANTERIOR APPROACH; Surgeon: Mcarthur Rossetti; Location: [facility] ORS; Service: Orthopedics; Laterality: Right; Removal of Cannulated Screws Right Hip, Right Direct Anterior Hip Replacement    Family History   Problem  Relation  Age of Onset   .  Colon cancer  Neg Hx    Social History  History   Substance Use Topics   .  Smoking status:  Former Smoker -- 0.50 packs/day for 40 years     Types:  Cigarettes     Quit date:  05/06/2001   .  Smokeless tobacco:  Not on file   .  Alcohol Use:  No    Allergies   Allergen  Reactions   .  Codeine      REACTION: N \\T \ V   .  Sulfonamide Derivatives      REACTION: Dry mouth    Current Outpatient Prescriptions   Medication  Sig  Dispense  Refill   .  aspirin 81 MG EC tablet  Take 81 mg by mouth daily after breakfast.     .  atorvastatin (LIPITOR) 40 MG tablet  Take 1 tablet (40 mg total) by mouth every evening.  30 tablet  6   .  carvedilol (COREG) 12.5 MG tablet  Take 1 tablet (12.5 mg total) by mouth 2 (two) times daily with a meal.  60 tablet   5   .  cetirizine (ZYRTEC) 10 MG tablet  Take 10 mg by mouth daily.     .  disopyramide (NORPACE) 150 MG capsule  Take 1 capsule (150 mg total) by mouth 2 (two) times daily.  60 capsule  6   .  furosemide (LASIX) 40 MG tablet  Take 1 tablet (40 mg total) by mouth daily.  30 tablet  4   .  verapamil (CALAN-SR) 180 MG CR tablet  Take 180 mg by mouth at bedtime.     Marland Kitchen  warfarin (COUMADIN) 1 MG tablet  Take 1 tablet (1 mg total) by mouth as directed. Take as directed by coumadin clinic  70 tablet  2   .  [DISCONTINUED] hydrochlorothiazide (HYDRODIURIL) 25 MG tablet  Take 25 mg by mouth daily after breakfast.     .  [DISCONTINUED] potassium chloride SA (KLOR-CON M20) 20 MEQ tablet  Take 20 mEq by mouth daily.      No current facility-administered medications for this visit.   Review of Systems  Review of Systems  Constitutional: Negative for fever, chills and unexpected weight change.  HENT: Negative for hearing loss, congestion, sore throat, trouble swallowing and voice change.  Eyes: Negative for visual disturbance.  Respiratory: Negative for cough and wheezing.  Cardiovascular: Negative for chest pain, palpitations and leg swelling.  Gastrointestinal: Positive for nausea, abdominal pain and abdominal distention. Negative for vomiting, diarrhea, constipation, blood in stool and anal bleeding.  Genitourinary: Negative for hematuria, vaginal bleeding and difficulty urinating.  Musculoskeletal: Negative for arthralgias.  Skin: Negative for rash and wound.  Neurological: Negative for seizures, syncope and headaches.  Hematological: Negative for adenopathy. Does not bruise/bleed easily.  Psychiatric/Behavioral: Negative for confusion.  Blood pressure 142/83, pulse 72, temperature 98.6 F (37 C), temperature source Temporal, resp. rate 16, height 5' 7.5" (1.715 m), weight 186 lb (84.369 kg).  Physical Exam  Physical Exam  WDWN in NAD  HEENT: EOMI, sclera anicteric  Neck: No masses, no  thyromegaly  Lungs: CTA bilaterally; normal respiratory effort  CV: Regular rate and rhythm;  no murmurs  Abd: +bowel sounds, soft, tender in epigastrium and RUQ; no palpable masses  Ext: Well-perfused; no edema  Skin: Warm, dry; no sign of jaundice  Data Reviewed  U/S from [place] - cholelithiasis, no cholecystitis; no CBD abnormalities  Lab Results   Component  Value  Date    WBC  4.9  09/01/2012    HGB  12.4  09/01/2012    HCT  36.1  09/01/2012    MCV  88.1  09/01/2012    PLT  185.0  09/01/2012    Lab Results   Component  Value  Date    CREATININE  1.5*  09/01/2012    BUN  21  09/01/2012    NA  142  09/01/2012    K  3.9  09/01/2012    CL  101  09/01/2012    CO2  32  09/01/2012    Lab Results   Component  Value  Date    ALT  23  09/01/2012    AST  23  09/01/2012    ALKPHOS  60  09/01/2012    BILITOT  0.7  09/01/2012   Assessment  Chronic calculus cholecystitis  Significant cardiac history  Plan  Cardiac clearance by Dr. Cristopher Peru - will need to hold Coumadin prior to surgery and will need her pacer/ defibrillator addressed the morning of surgery. Will perform surgery at [facility]. The surgical procedure has been discussed with the patient. Potential risks, benefits, alternative treatments, and expected outcomes have been explained. All of the patient's questions at this time have been answered. The likelihood of reaching the patient's treatment goal is good. The patient understand the proposed surgical procedure and wishes to proceed.    Imogene Burn. Georgette Dover, MD, [facility] Surgery  General/ Trauma Surgery  11/11/2012 9:45 AM

## 2012-11-11 NOTE — Anesthesia Preprocedure Evaluation (Signed)
Anesthesia Evaluation  Patient identified by MRN, date of birth, ID band Patient awake    Reviewed: Allergy & Precautions, H&P , NPO status , Patient's Chart, lab work & pertinent test results, reviewed documented beta blocker date and time   History of Anesthesia Complications (+) PONV  Airway Mallampati: II TM Distance: >3 FB Neck ROM: full    Dental   Pulmonary shortness of breath, COPD breath sounds clear to auscultation        Cardiovascular hypertension, On Medications and On Home Beta Blockers + CAD and + Peripheral Vascular Disease + dysrhythmias Atrial Fibrillation + pacemaker + Cardiac Defibrillator Rhythm:regular     Neuro/Psych PSYCHIATRIC DISORDERS negative neurological ROS     GI/Hepatic negative GI ROS, Neg liver ROS,   Endo/Other  negative endocrine ROS  Renal/GU Renal disease  negative genitourinary   Musculoskeletal   Abdominal   Peds  Hematology negative hematology ROS (+)   Anesthesia Other Findings See surgeon's H&P   Reproductive/Obstetrics negative OB ROS                           Anesthesia Physical Anesthesia Plan  ASA: III  Anesthesia Plan: General   Post-op Pain Management:    Induction: Intravenous  Airway Management Planned: Oral ETT  Additional Equipment:   Intra-op Plan:   Post-operative Plan: Extubation in OR  Informed Consent: I have reviewed the patients History and Physical, chart, labs and discussed the procedure including the risks, benefits and alternatives for the proposed anesthesia with the patient or authorized representative who has indicated his/her understanding and acceptance.   Dental Advisory Given  Plan Discussed with: CRNA and Surgeon  Anesthesia Plan Comments:         Anesthesia Quick Evaluation

## 2012-11-11 NOTE — Preoperative (Signed)
Beta Blockers   Reason not to administer Beta Blockers:coreg taken 11/11/12

## 2012-11-11 NOTE — Anesthesia Procedure Notes (Signed)
Procedure Name: Intubation Date/Time: 11/11/2012 10:54 AM Performed by: Sherilyn Banker Pre-anesthesia Checklist: Patient identified, Emergency Drugs available, Suction available, Patient being monitored and Timeout performed Patient Re-evaluated:Patient Re-evaluated prior to inductionOxygen Delivery Method: Circle system utilized Preoxygenation: Pre-oxygenation with 100% oxygen Intubation Type: IV induction Ventilation: Mask ventilation without difficulty and Oral airway inserted - appropriate to patient size Laryngoscope Size: Mac and 3 Grade View: Grade I Tube type: Oral Tube size: 7.5 mm Number of attempts: 1 Airway Equipment and Method: Stylet Placement Confirmation: ETT inserted through vocal cords under direct vision,  positive ETCO2 and breath sounds checked- equal and bilateral Secured at: 22 cm Tube secured with: Tape Dental Injury: Teeth and Oropharynx as per pre-operative assessment

## 2012-11-11 NOTE — Op Note (Addendum)
Laparoscopic lysis of adhesion (90 min)/ Laparoscopic Cholecystectomy with IOC Procedure Note  Indications: This patient presents with symptomatic gallbladder disease and will undergo laparoscopic cholecystectomy.  She has had previous nephrectomy and appendectomy  Pre-operative Diagnosis: Calculus of gallbladder with other cholecystitis, without mention of obstruction  Post-operative Diagnosis: Same     Severe intra-abdominal adhesions. Surgeon: Maia Petties.   Assistants: Dr. Christie Beckers  Anesthesia: General endotracheal anesthesia  ASA Class: 3  Procedure Details  The patient was seen again in the Holding Room. The risks, benefits, complications, treatment options, and expected outcomes were discussed with the patient. The possibilities of reaction to medication, pulmonary aspiration, perforation of viscus, bleeding, recurrent infection, finding a normal gallbladder, the need for additional procedures, failure to diagnose a condition, the possible need to convert to an open procedure, and creating a complication requiring transfusion or operation were discussed with the patient. The likelihood of improving the patient's symptoms with return to their baseline status is good.  The patient and/or family concurred with the proposed plan, giving informed consent. The site of surgery properly noted. The patient was taken to Operating Room, identified as [name] and the procedure verified as Laparoscopic Cholecystectomy with Intraoperative Cholangiogram. A Time Out was held and the above information confirmed.  Prior to the induction of general anesthesia, antibiotic prophylaxis was administered. General endotracheal anesthesia was then administered and tolerated well. After the induction, the abdomen was prepped with Chloraprep and draped in the sterile fashion. The patient was positioned in the supine position.  Local anesthetic agent was injected into the skin near the umbilicus and  an incision made. We dissected down to the abdominal fascia with blunt dissection.  The fascia was incised vertically and we entered the peritoneal cavity bluntly.  A pursestring suture of 0-Vicryl was placed around the fascial opening.  The Hasson cannula was inserted and secured with the stay suture.  Pneumoperitoneum was then created with CO2 and tolerated well without any adverse changes in the patient's vital signs. The camera was inserted. We encountered significant omental adhesions. We were able to bluntly dissect with the tip of the camera to we entered the left upper quadrant. We were able to visualize the falciform ligament.  An 11-mm port was placed in the subxiphoid position.  An additional 5 mm port was placed laterally on the left. We then spent about 45 minutes dissecting omental adhesions away from the anterior abdominal wall. We were finally able to expose the liver. The omentum was densely adherent to the liver. We placed two5 mm ports in the right upper quadrant. All skin incisions were infiltrated with a local anesthetic agent before making the incision and placing the trocars.   We positioned the patient in reverse Trendelenburg, tilted slightly to the patient's left.  We began carefully dissecting at the edge of the liver. We took down some of the omental adhesions. We began bluntly dissecting what appeared to be the gallbladder away from the omentum. However as we continued our dissection this turned out to be the first portion of the duodenum which was adherent to the edge of the liver. We able to dissect the omentum away from the anterior surface of the duodenum. We were then able to dissect the duodenum away from the gallbladder.The gallbladder was identified, the fundus grasped and retracted cephalad. Adhesions were lysed bluntly, taking care not to injure any adjacent organs or viscus. The infundibulum was grasped and retracted laterally, exposing the peritoneum overlying the triangle  of Calot. This was then divided and exposed in a blunt fashion. A critical view of the cystic duct and cystic artery was obtained.  A small hole was inadvertently made in the infundibulum of the gallbladder and some bile was spilled.  The cystic duct was clearly identified and bluntly dissected circumferentially. The cystic duct was ligated with a clip distally.   An incision was made in the cystic duct and the [facility] cholangiogram catheter introduced. The catheter was secured using a clip. A cholangiogram was then obtained which showed good visualization of the distal and proximal biliary tree with no sign of filling defects or obstruction.  The catheter was seen in the cystic duct.  Contrast flowed easily into the duodenum. The catheter was then removed.   The cystic duct was then ligated with clips and divided. The cystic artery was identified, dissected free, ligated with clips and divided as well.   The gallbladder was dissected from the liver bed in retrograde fashion with the electrocautery. The gallbladder was removed and placed in an Endocatch sac. The liver bed was irrigated and inspected. Hemostasis was achieved with the electrocautery. Copious irrigation was utilized and was repeatedly aspirated until clear.  A drain was placed in the gallbladder fossa, exiting the right lateral port site.  This was secured with 2-0 Ethilon.  The gallbladder and Endocatch sac were then removed through the umbilical port site.  The pursestring suture was used to close the umbilical fascia.    We again inspected the right upper quadrant for hemostasis.  Pneumoperitoneum was released as we removed the trocars.  4-0 Monocryl was used to close the skin.   Benzoin, steri-strips, and clean dressings were applied. The patient was then extubated and brought to the recovery room in stable condition. Instrument, sponge, and needle counts were correct at closure and at the conclusion of the case.   Findings: Cholecystitis  with Cholelithiasis  Estimated Blood Loss: less than 100 mL         Drains: JP drain RUQ         Specimens: Gallbladder           Complications: None; patient tolerated the procedure well.         Disposition: PACU - hemodynamically stable.         Condition: stable  [name] Burn. Georgette Dover, MD, [facility] Surgery  General/ Trauma Surgery  11/11/2012 1:41 PM

## 2012-11-11 NOTE — Transfer of Care (Signed)
Immediate Anesthesia Transfer of Care Note  Patient: [name]  Procedure(s) Performed: Procedure(s): LAPAROSCOPIC CHOLECYSTECTOMY WITH INTRAOPERATIVE CHOLANGIOGRAM (N/A) LAPAROSCOPIC LYSIS OF ADHESIONS (N/A)  Patient Location: PACU  Anesthesia Type:General  Level of Consciousness: awake and patient cooperative  Airway & Oxygen Therapy: Patient Spontanous Breathing and Patient connected to face mask oxygen  Post-op Assessment: Report given to PACU RN, Post -op Vital signs reviewed and stable and Patient moving all extremities X 4  Post vital signs: Reviewed and stable  Complications: No apparent anesthesia complications

## 2012-11-12 ENCOUNTER — Encounter (HOSPITAL_COMMUNITY): Payer: Self-pay | Admitting: Surgery

## 2012-11-12 LAB — COMPREHENSIVE METABOLIC PANEL
ALT: 32 U/L (ref 0–35)
AST: 44 U/L — ABNORMAL HIGH (ref 0–37)
Albumin: 3.7 g/dL (ref 3.5–5.2)
Alkaline Phosphatase: 59 U/L (ref 39–117)
BUN: 22 mg/dL (ref 6–23)
CO2: 25 mEq/L (ref 19–32)
Calcium: 9.2 mg/dL (ref 8.4–10.5)
Chloride: 101 mEq/L (ref 96–112)
Creatinine, Ser: 1.5 mg/dL — ABNORMAL HIGH (ref 0.50–1.10)
GFR calc Af Amer: 38 mL/min — ABNORMAL LOW (ref >90)
GFR calc non Af Amer: 33 mL/min — ABNORMAL LOW (ref >90)
Glucose, Bld: 131 mg/dL — ABNORMAL HIGH (ref 70–99)
Potassium: 4.5 mEq/L (ref 3.5–5.1)
Sodium: 138 mEq/L (ref 135–145)
Total Bilirubin: 0.4 mg/dL (ref 0.3–1.2)
Total Protein: 6.3 g/dL (ref 6.0–8.3)

## 2012-11-12 LAB — PROTIME-INR
INR: 1.28 (ref 0.00–1.49)
Prothrombin Time: 15.7 seconds — ABNORMAL HIGH (ref 11.6–15.2)

## 2012-11-12 LAB — CBC
HCT: 33.4 % — ABNORMAL LOW (ref 36.0–46.0)
Hemoglobin: 10.7 g/dL — ABNORMAL LOW (ref 12.0–15.0)
MCH: 28.8 pg (ref 26.0–34.0)
MCHC: 32 g/dL (ref 30.0–36.0)
MCV: 89.8 fL (ref 78.0–100.0)
Platelets: 165 10*3/uL (ref 150–400)
RBC: 3.72 MIL/uL — ABNORMAL LOW (ref 3.87–5.11)
RDW: 15.4 % (ref 11.5–15.5)
WBC: 7.5 10*3/uL (ref 4.0–10.5)

## 2012-11-12 MED ORDER — WARFARIN SODIUM 3 MG PO TABS
3.0000 mg | ORAL_TABLET | Freq: Once | ORAL | Status: AC
  Administered 2012-11-12: 3 mg via ORAL
  Filled 2012-11-12 (×2): qty 1

## 2012-11-12 MED ORDER — WARFARIN - PHARMACIST DOSING INPATIENT: Freq: Every day | Status: DC

## 2012-11-12 NOTE — Progress Notes (Signed)
Spoke with [name] with [employer] and she stated that ICD was turned on yesterday.

## 2012-11-12 NOTE — Progress Notes (Signed)
ANTICOAGULATION CONSULT NOTE - Initial Consult  Pharmacy Consult for coumadin Indication: atrial fibrillation  Allergies  Allergen Reactions  . Codeine Nausea And Vomiting  . Morphine And Related Nausea And Vomiting  . Sulfonamide Derivatives     REACTION: Dry mouth    Patient Measurements: Height: 5' 7.5" (171.5 cm) Weight: 191 lb 3.2 oz (86.728 kg) IBW/kg (Calculated) : 62.75   Vital Signs: Temp: 98.1 F (36.7 C) (07/10 0547) Temp src: Oral (07/10 0547) BP: 130/59 mmHg (07/10 0547) Pulse Rate: 61 (07/10 0547)  Labs:  Recent Labs  11/11/12 1751 11/12/12 0510  HGB 11.3* 10.7*  HCT 34.9* 33.4*  PLT 170 165  LABPROT  --  15.7*  INR  --  1.28  CREATININE 1.43*  --     Estimated Creatinine Clearance: 38.3 ml/min (by C-G formula based on Cr of 1.43).   Medical History: Past Medical History  Diagnosis Date  . Carotid artery stenosis 09/2007    60-79% bilateral (stable)  . Carotid artery stenosis 10/2008    40-59% R 60-79%   . Chest pain, unspecified   . Atrial fibrillation   . Sinus bradycardia   . Hypotension, unspecified     cardiac cath 2006..nonobstructive CAD 30-40s lesions.Marland KitchenETT 1/09 nondiagnostic due to poor HR response..Right Renal Cancer 2003  . Blood in stool   . Secondary cardiomyopathy, unspecified   . Chronic airway obstruction, not elsewhere classified   . Long term (current) use of anticoagulants   . Dysuria   . Edema   . Encounter for therapeutic drug monitoring   . Other malaise and fatigue   . Hypertrophic obstructive cardiomyopathy   . Urge incontinence   . Other acute sinusitis   . Other and unspecified coagulation defects   . Other screening mammogram   . Pre-operative cardiovascular examination   . Hemorrhage of rectum and anus   . Chronic kidney disease, unspecified   . Special screening for osteoporosis   . Hypertension 05/20/2011  . PONV (postoperative nausea and vomiting)   . Adjustment disorder with anxiety   . Coronary  artery disease     non-obstructive by 2006 cath  . Automatic implantable cardioverter-defibrillator in situ   . High cholesterol   . Anginal pain   . Chronic bronchitis     "get it some; not q year" (11/11/2012)  . Exertional shortness of breath   . History of blood transfusion 04/2011    "after hip OR" (11/11/2012)  . Arthritis     "some in my hands" (11/11/2012)  . Osteoarthritis of right hip   . Renal cancer 06/2001    Right  . Malignant neoplasm of kidney, except pelvis   . Frequent UTI     "get them a couple times/yr" (11/11/2012)    Medications:  Prescriptions prior to admission  Medication Sig Dispense Refill  . aspirin 81 MG EC tablet Take 81 mg by mouth daily after breakfast.       . atorvastatin (LIPITOR) 40 MG tablet Take 1 tablet (40 mg total) by mouth every evening.  30 tablet  6  . carvedilol (COREG) 12.5 MG tablet Take 1 tablet (12.5 mg total) by mouth 2 (two) times daily with a meal.  60 tablet  5  . cetirizine (ZYRTEC) 10 MG tablet Take 10 mg by mouth daily.       . disopyramide (NORPACE) 150 MG capsule Take 1 capsule (150 mg total) by mouth 2 (two) times daily.  60 capsule  6  . furosemide (  LASIX) 40 MG tablet Take 1 tablet (40 mg total) by mouth daily.  30 tablet  4  . potassium chloride (K-DUR,KLOR-CON) 10 MEQ tablet Take 10 mEq by mouth 2 (two) times daily with breakfast and lunch.      . verapamil (CALAN-SR) 180 MG CR tablet Take 180 mg by mouth at bedtime.       Marland Kitchen warfarin (COUMADIN) 1 MG tablet Take 2-2.5 mg by mouth daily. 2 mg Sunday, Monday, Tuesday, Thursday, Friday and Saturday 2.5 mg on Wednesday        Assessment: [name] is a 79 yo F POD # 1 s/p lap chole w/ LOA to resume her home coumadin for afib.  Her home coumadin dose is 2 mg every day except 2.5 mg on Wednesday.  Her last coumadin dose PTA was 11/06/12.    Her INR today is 1.28.  Her H/H is 10.7/33.4. PLTC 165k.   Goal of Therapy:  INR 2-3 Monitor platelets by anticoagulation protocol: Yes    Plan:  1. Coumadin 3 mg po x 1 dose today ( 1.5 x home dose) 2. Daily INR 3. Continue LMWH 40 mg sq q24 until INR > 2  Eudelia Bunch, Pharm.D. [id number] 11/12/2012 7:14 AM

## 2012-11-12 NOTE — Progress Notes (Signed)
1 Day Post-Op  Subjective: Sore, mild nausea Sore throat  Objective: Vital signs in last 24 hours: Temp:  [97.1 F (36.2 C)-98.1 F (36.7 C)] 98.1 F (36.7 C) (07/10 0547) Pulse Rate:  [57-65] 61 (07/10 0547) Resp:  [8-19] 16 (07/10 0547) BP: (102-141)/(54-100) 130/59 mmHg (07/10 0547) SpO2:  [96 %-100 %] 97 % (07/10 0547) Weight:  [191 lb 3.2 oz (86.728 kg)] 191 lb 3.2 oz (86.728 kg) (07/09 2300)    Intake/Output from previous day: 07/09 0701 - 07/10 0700 In: 2400 [I.V.:2400] Out: 150 [Drains:150] Intake/Output this shift:    General appearance: alert, cooperative and no distress Resp: clear to auscultation bilaterally Cardio: regular rate and rhythm, S1, S2 normal, no murmur, click, rub or gallop Incisions c/d/i Drain - serosanguinous output Lab Results:   Recent Labs  11/11/12 1751 11/12/12 0510  WBC 8.5 7.5  HGB 11.3* 10.7*  HCT 34.9* 33.4*  PLT 170 165   BMET  Recent Labs  11/11/12 1751  CREATININE 1.43*   PT/INR  Recent Labs  11/12/12 0510  LABPROT 15.7*  INR 1.28   ABG No results found for this basename: PHART, PCO2, PO2, HCO3,  in the last 72 hours  Studies/Results: Dg Cholangiogram Operative  11/11/2012   *RADIOLOGY REPORT*  Clinical Data: Chronic cholecystitis.  INTRAOPERATIVE CHOLANGIOGRAM  Technique:  C-arm fluoroscopic images were obtained intraoperatively and submitted for postoperative interpretation. Please see the performing provider's procedural report for the fluoroscopy time utilized.  Comparison: None.  Findings: The gallbladder has been removed and the cystic duct cannulated; there is good opacification of the biliary tree.  There are no filling defects or obstruction.  IMPRESSION: Negative operative cholangiogram.   Original Report Authenticated By: Rolla Flatten, M.D.    Anti-infectives: Anti-infectives   Start     Dose/Rate Route Frequency Ordered Stop   11/11/12 0600  ceFAZolin (ANCEF) IVPB 2 g/50 mL premix     2 g 100 mL/hr  over 30 Minutes Intravenous On call to O.R. 11/10/12 1428 11/11/12 1050      Assessment/Plan: s/p Procedure(s): LAPAROSCOPIC CHOLECYSTECTOMY WITH INTRAOPERATIVE CHOLANGIOGRAM (N/A) LAPAROSCOPIC LYSIS OF ADHESIONS (N/A) Plan for discharge tomorrow slowly advance diet Mobilize Restart Coumadin   LOS: 1 day    Shamica Moree K. 11/12/2012

## 2012-11-12 NOTE — Anesthesia Postprocedure Evaluation (Signed)
Anesthesia Post Note  Patient: [name]  Procedure(s) Performed: Procedure(s) (LRB): LAPAROSCOPIC CHOLECYSTECTOMY WITH INTRAOPERATIVE CHOLANGIOGRAM (N/A) LAPAROSCOPIC LYSIS OF ADHESIONS (N/A)  Anesthesia type: General  Patient location: PACU  Post pain: Pain level controlled  Post assessment: Patient's Cardiovascular Status Stable  Last Vitals:  Filed Vitals:   11/12/12 0547  BP: 130/59  Pulse: 61  Temp: 36.7 C  Resp: 16    Post vital signs: Reviewed and stable  Level of consciousness: alert  Complications: No apparent anesthesia complications   ICD rep notified to turn ICD on. ICD listed as "on" in chart. Floor RN will verify before d/c with [employer] for documentation.

## 2012-11-13 ENCOUNTER — Telehealth (INDEPENDENT_AMBULATORY_CARE_PROVIDER_SITE_OTHER): Payer: Self-pay | Admitting: General Surgery

## 2012-11-13 LAB — PROTIME-INR
INR: 1.55 — ABNORMAL HIGH (ref 0.00–1.49)
Prothrombin Time: 18.2 seconds — ABNORMAL HIGH (ref 11.6–15.2)

## 2012-11-13 MED ORDER — PROMETHAZINE HCL 12.5 MG PO TABS: 12.5000 mg | ORAL_TABLET | Freq: Four times a day (QID) | ORAL | Status: DC | PRN

## 2012-11-13 MED ORDER — OXYCODONE-ACETAMINOPHEN 5-325 MG PO TABS: 1.0000 | ORAL_TABLET | ORAL | Status: DC | PRN

## 2012-11-13 NOTE — Progress Notes (Signed)
Patient alert and oriented x4.  IV access removed cannula intact.  No redness, bleeding, bruising, drainage to iv site. Pt tolerated removal well.  Pt given discharge instructions and denies any questions or concerns.  Pt refused pain med rx and MD notified.  Pt will like to take Tylenol only.  Teach back for JP drain care.  Pt denies any questions or concerns. Pt discharged per wheelchair to home with volunteer.

## 2012-11-13 NOTE — Discharge Summary (Signed)
Physician Discharge Summary  Patient ID: [name] MRN: [id number] DOB/AGE: 1934-02-24 79 y.o.  Admit date: 11/11/2012 Discharge date: 11/13/2012  Admission Diagnoses:  Chronic calculus cholecystitis      Coronary artery disease  Discharge Diagnoses: Same Active Problems:   * No active hospital problems. *   Discharged Condition: good  Hospital Course: Laparoscopic lysis of adhesions/ cholecystectomy with IOC on 11/11/12.  Drain placed at time of surgery.  Patient kept for cardiac monitoring and pain control.  Diet slowly advanced.  Drain output is serosanguinous.  Doing better on day of discharge.  Minimal pain  Consults: None  Significant Diagnostic Studies: none  Treatments: surgery: lap chole with IOC  Discharge Exam: Blood pressure 120/57, pulse 62, temperature 98.3 F (36.8 C), temperature source Oral, resp. rate 18, height 5' 7.5" (1.715 m), weight 191 lb 3.2 oz (86.728 kg), SpO2 92.00%. General appearance: alert, cooperative and no distress Resp: clear to auscultation bilaterally GI: soft, non-tender; bowel sounds normal; no masses,  no organomegaly Wounds c/d/i Drain serosanguinous drainage   Disposition: 03-Skilled Nursing Facility  Discharge Orders   Future Appointments Provider Department Dept Phone   11/16/2012 2:00 PM [facility] [place] [facility] [place] [phone]   11/24/2012 1:30 PM Imogene Burn. [place]   01/15/2013 11:30 AM [facility] [place]   Future Orders Complete By Expires     Call MD for:  persistant nausea and vomiting  As directed     Call MD for:  redness, tenderness, or signs of infection (pain, swelling, redness, odor or green/yellow discharge around incision site)  As directed     Call MD for:  severe uncontrolled pain  As directed     Call MD for:  temperature >100.4  As directed     Diet general  As directed     Discharge  wound care:  As directed     Comments:      Empty and record drain output daily    Driving Restrictions  As directed     Comments:      Do not drive while taking pain medications    Increase activity slowly  As directed     May walk up steps  As directed         Medication List         aspirin 81 MG EC tablet  Take 81 mg by mouth daily after breakfast.     atorvastatin 40 MG tablet  Commonly known as:  LIPITOR  Take 1 tablet (40 mg total) by mouth every evening.     carvedilol 12.5 MG tablet  Commonly known as:  COREG  Take 1 tablet (12.5 mg total) by mouth 2 (two) times daily with a meal.     cetirizine 10 MG tablet  Commonly known as:  ZYRTEC  Take 10 mg by mouth daily.     disopyramide 150 MG capsule  Commonly known as:  NORPACE  Take 1 capsule (150 mg total) by mouth 2 (two) times daily.     furosemide 40 MG tablet  Commonly known as:  LASIX  Take 1 tablet (40 mg total) by mouth daily.     oxyCODONE-acetaminophen 5-325 MG per tablet  Commonly known as:  PERCOCET/ROXICET  Take 1-2 tablets by mouth every 4 (four) hours as needed.     potassium chloride 10 MEQ tablet  Commonly known as:  K-DUR,KLOR-CON  Take 10 mEq by  mouth 2 (two) times daily with breakfast and lunch.     promethazine 12.5 MG tablet  Commonly known as:  PHENERGAN  Take 1 tablet (12.5 mg total) by mouth every 6 (six) hours as needed for nausea.     verapamil 180 MG CR tablet  Commonly known as:  CALAN-SR  Take 180 mg by mouth at bedtime.     warfarin 1 MG tablet  Commonly known as:  COUMADIN  - Take 2-2.5 mg by mouth daily. 2 mg Sunday, Monday, Tuesday, Thursday, Friday and Saturday  - 2.5 mg on Wednesday           Follow-up Information   Follow up with Donnie Mesa K., MD In 3 days.   Contact information:   [address] [place] [address] [phone]       Signed: Maia Petties. 11/13/2012, 8:36 AM

## 2012-11-13 NOTE — Telephone Encounter (Signed)
Message copied by Maryclare Bean on Fri Nov 13, 2012  8:51 AM ------      Message from: Maia Petties      Created: Fri Nov 13, 2012  8:39 AM       She has a drain in place.  I need to see her on Monday.  Please call and schedule.            Tsuei ------

## 2012-11-13 NOTE — Telephone Encounter (Signed)
Called patient this morning and made apt for patient to come in to see Dr Gershon Crane on 11-16-12 2 10:45

## 2012-11-16 ENCOUNTER — Ambulatory Visit (INDEPENDENT_AMBULATORY_CARE_PROVIDER_SITE_OTHER): Payer: Medicare Other | Admitting: Family Medicine

## 2012-11-16 ENCOUNTER — Ambulatory Visit (INDEPENDENT_AMBULATORY_CARE_PROVIDER_SITE_OTHER): Payer: Medicare Other | Admitting: Surgery

## 2012-11-16 ENCOUNTER — Other Ambulatory Visit: Payer: Self-pay | Admitting: Family Medicine

## 2012-11-16 ENCOUNTER — Encounter (INDEPENDENT_AMBULATORY_CARE_PROVIDER_SITE_OTHER): Payer: Self-pay | Admitting: Surgery

## 2012-11-16 VITALS — BP 122/72 | HR 81 | Temp 97.5°F | Ht 67.5 in | Wt 187.2 lb

## 2012-11-16 DIAGNOSIS — Z7901 Long term (current) use of anticoagulants: Secondary | ICD-10-CM

## 2012-11-16 DIAGNOSIS — K8010 Calculus of gallbladder with chronic cholecystitis without obstruction: Secondary | ICD-10-CM

## 2012-11-16 DIAGNOSIS — I4891 Unspecified atrial fibrillation: Secondary | ICD-10-CM

## 2012-11-16 DIAGNOSIS — Z5181 Encounter for therapeutic drug level monitoring: Secondary | ICD-10-CM

## 2012-11-16 LAB — POCT INR: INR: 1.9

## 2012-11-16 MED ORDER — WARFARIN SODIUM 2 MG PO TABS: 2.0000 mg | ORAL_TABLET | Freq: Every day | ORAL | Status: DC

## 2012-11-16 NOTE — Progress Notes (Signed)
Status post laparoscopic cholecystectomy with Intra-Op cholangiogram extensive lysis of adhesions on 11/11/12. There were significant adhesions from her previous surgery. Her duodenum was densely adherent to the gallbladder and to the edge of the liver. This required a lot of extensive dissection. She has a drain in place. It is only putting out serosanguineous fluid. She is still having some abdominal soreness but this is slowly improving day by day.  Bowel movements have returned to normal. She still does not have a normal appetite.  Filed Vitals:   11/16/12 1108  BP: 122/72  Pulse: 81  Temp: 97.5 F (36.4 C)   WDWN in NAD No jaundice or scleral icterus Abd - some incisional tenderness; incisions well healed no sign of infection. The drain is removed without difficulty.  Status post very difficult laparoscopic cholecystectomy with intraoperative clinic. Doing reasonably well.  Will recheck in 2 weeks. She may shower and then change the dressing over the drain site until it is healed.  Imogene Burn. Georgette Dover, MD, [facility]  General/ Trauma Surgery  11/16/2012 12:12 PM

## 2012-11-20 ENCOUNTER — Telehealth (INDEPENDENT_AMBULATORY_CARE_PROVIDER_SITE_OTHER): Payer: Self-pay

## 2012-11-20 NOTE — Telephone Encounter (Signed)
The pt called reporting that since she was seen Monday she has had left side pain below the breast and a little around to the left side.  It is not constant but lasted one hour today.  It gets worse when she eats and she burps a lot.  Her incisions are ok.  She had this pain preop.  Her abdomen is a little swollen.  She eats and drinks ok.  She is urinating and having bowel movements.  She has no fever.  I spoke to Dr Georgette Dover and he thinks this is not from surgery.  She had a lot of adhesions during surgery but the gall bladder wouldn't cause the left side pain.  He recommends her primary md may need to send her to GI.  I notified the pt and she will wait and see if it continues 1st.

## 2012-11-24 ENCOUNTER — Encounter (INDEPENDENT_AMBULATORY_CARE_PROVIDER_SITE_OTHER): Payer: Medicare Other | Admitting: Surgery

## 2012-12-01 ENCOUNTER — Encounter (INDEPENDENT_AMBULATORY_CARE_PROVIDER_SITE_OTHER): Payer: Medicare Other | Admitting: Surgery

## 2012-12-14 ENCOUNTER — Ambulatory Visit (INDEPENDENT_AMBULATORY_CARE_PROVIDER_SITE_OTHER): Payer: Medicare Other | Admitting: Family Medicine

## 2012-12-14 ENCOUNTER — Other Ambulatory Visit: Payer: Self-pay | Admitting: Family Medicine

## 2012-12-14 DIAGNOSIS — Z5181 Encounter for therapeutic drug level monitoring: Secondary | ICD-10-CM

## 2012-12-14 DIAGNOSIS — Z7901 Long term (current) use of anticoagulants: Secondary | ICD-10-CM

## 2012-12-14 DIAGNOSIS — I4891 Unspecified atrial fibrillation: Secondary | ICD-10-CM

## 2012-12-14 LAB — POCT INR: INR: 2.8

## 2012-12-14 MED ORDER — WARFARIN SODIUM 1 MG PO TABS: ORAL_TABLET | ORAL | Status: DC

## 2012-12-14 MED ORDER — WARFARIN SODIUM 1 MG PO TABS: 2.0000 mg | ORAL_TABLET | Freq: Every day | ORAL | Status: DC

## 2012-12-15 ENCOUNTER — Other Ambulatory Visit: Payer: Self-pay

## 2012-12-15 MED ORDER — CARVEDILOL 12.5 MG PO TABS: 12.5000 mg | ORAL_TABLET | Freq: Two times a day (BID) | ORAL | Status: DC

## 2012-12-15 NOTE — Telephone Encounter (Signed)
Refill sent for carvedilol.  

## 2012-12-17 ENCOUNTER — Encounter (INDEPENDENT_AMBULATORY_CARE_PROVIDER_SITE_OTHER): Payer: Medicare Other | Admitting: Surgery

## 2013-01-01 ENCOUNTER — Encounter: Payer: Self-pay | Admitting: Family Medicine

## 2013-01-01 ENCOUNTER — Ambulatory Visit: Payer: Medicare Other | Admitting: Family Medicine

## 2013-01-01 ENCOUNTER — Ambulatory Visit (INDEPENDENT_AMBULATORY_CARE_PROVIDER_SITE_OTHER): Payer: Medicare Other | Admitting: Family Medicine

## 2013-01-01 VITALS — BP 110/78 | HR 79 | Temp 97.9°F | Wt 184.0 lb

## 2013-01-01 DIAGNOSIS — J309 Allergic rhinitis, unspecified: Secondary | ICD-10-CM | POA: Insufficient documentation

## 2013-01-01 MED ORDER — FLUTICASONE PROPIONATE 50 MCG/ACT NA SUSP: 2.0000 | Freq: Every day | NASAL | Status: DC

## 2013-01-01 NOTE — Patient Instructions (Addendum)
Drink plenty of fluids, use the nasal spray, and gargle with warm salt water for your throat.  This should gradually improve.  Take care.  Let us know if you have other concerns.

## 2013-01-01 NOTE — Progress Notes (Signed)
"  I don't feel sick."  Sx started about 10 days ago.  She had the feeling of needing to swallow but no true ST.  Then had a cough.  Cough is day and night. HA-frontal- is worse if leaning forward. No fevers. On coumadin. No diarrhea, no vomiting. Eyes have been watering. L ear pain prev, resolved now.  No R ear pain.  No sputum usually with cough.  Using otc cough drops.  On zyrtec.  Some postnasal gtt.   Meds, vitals, and allergies reviewed.   ROS: See HPI.  Otherwise, noncontributory.  nad ncat Mmm Tm wnl Nasal exam stuffy Sinuses minimally ttp No erythema in the OP Neck supple, no LA rrr ctab

## 2013-01-01 NOTE — Assessment & Plan Note (Signed)
Likely cause, doesn't appear infectious.   Try flonase, report back as needed.   She agrees. F/u prn.

## 2013-01-05 ENCOUNTER — Telehealth: Payer: Self-pay | Admitting: *Deleted

## 2013-01-05 MED ORDER — AMOXICILLIN-POT CLAVULANATE 875-125 MG PO TABS: 1.0000 | ORAL_TABLET | Freq: Two times a day (BID) | ORAL | Status: DC

## 2013-01-05 NOTE — Telephone Encounter (Signed)
augmentin BID for 10 days, sent.  Cut coumadin back to 1mg  a day in the meantime.  Get her on the INR schedule for later this week.  Thanks.

## 2013-01-05 NOTE — Telephone Encounter (Signed)
Pt was seen last week, is c/o allergies. The nasal spray and zyrtec given has not helped. She is c/o cough sometimes productive (she did not look at mucus, so is not sure of color or any changes in it)  post nasal drip, and watery eyes, denies fever. She request to have abx called in to [facility]. Please advise?

## 2013-01-06 NOTE — Telephone Encounter (Signed)
Patient notified as instructed by telephone. Advised patient that Benjie Karvonen will be in touch with her to scheduled the INR. Patient wants call back on cell phone.

## 2013-01-07 NOTE — Telephone Encounter (Signed)
Spoke with pt, appt scheduled for 01/11/13.

## 2013-01-11 ENCOUNTER — Ambulatory Visit (INDEPENDENT_AMBULATORY_CARE_PROVIDER_SITE_OTHER): Payer: Medicare Other | Admitting: Family Medicine

## 2013-01-11 DIAGNOSIS — I4891 Unspecified atrial fibrillation: Secondary | ICD-10-CM

## 2013-01-11 DIAGNOSIS — Z7901 Long term (current) use of anticoagulants: Secondary | ICD-10-CM

## 2013-01-11 DIAGNOSIS — Z5181 Encounter for therapeutic drug level monitoring: Secondary | ICD-10-CM

## 2013-01-11 LAB — POCT INR: INR: 1.4

## 2013-01-14 ENCOUNTER — Other Ambulatory Visit: Payer: Self-pay | Admitting: *Deleted

## 2013-01-14 MED ORDER — ATORVASTATIN CALCIUM 40 MG PO TABS: 40.0000 mg | ORAL_TABLET | Freq: Every evening | ORAL | Status: DC

## 2013-02-08 ENCOUNTER — Ambulatory Visit (INDEPENDENT_AMBULATORY_CARE_PROVIDER_SITE_OTHER): Payer: Medicare Other | Admitting: *Deleted

## 2013-02-08 ENCOUNTER — Ambulatory Visit (INDEPENDENT_AMBULATORY_CARE_PROVIDER_SITE_OTHER): Payer: Medicare Other | Admitting: Family Medicine

## 2013-02-08 DIAGNOSIS — I421 Obstructive hypertrophic cardiomyopathy: Secondary | ICD-10-CM

## 2013-02-08 DIAGNOSIS — I4891 Unspecified atrial fibrillation: Secondary | ICD-10-CM

## 2013-02-08 DIAGNOSIS — Z5181 Encounter for therapeutic drug level monitoring: Secondary | ICD-10-CM

## 2013-02-08 DIAGNOSIS — I5042 Chronic combined systolic (congestive) and diastolic (congestive) heart failure: Secondary | ICD-10-CM

## 2013-02-08 DIAGNOSIS — Z7901 Long term (current) use of anticoagulants: Secondary | ICD-10-CM

## 2013-02-08 LAB — ICD DEVICE OBSERVATION
AL AMPLITUDE: 1.5 mv
AL IMPEDENCE ICD: 456 Ohm
AL THRESHOLD: 1 V
ATRIAL PACING ICD: 99.76 pct
BAMS-0001: 170 {beats}/min
BATTERY VOLTAGE: 3.0707 V
CHARGE TIME: 9.719 s
DEV-0020ICD: NEGATIVE
FVT: 0
PACEART VT: 0
RV LEAD AMPLITUDE: 11.875 mv
RV LEAD IMPEDENCE ICD: 456 Ohm
RV LEAD THRESHOLD: 0.75 V
TOT-0001: 1
TOT-0002: 0
TOT-0006: 20111230000000
TZAT-0001ATACH: 1
TZAT-0001ATACH: 2
TZAT-0001ATACH: 3
TZAT-0001FASTVT: 1
TZAT-0001SLOWVT: 1
TZAT-0001SLOWVT: 2
TZAT-0002ATACH: NEGATIVE
TZAT-0002ATACH: NEGATIVE
TZAT-0002ATACH: NEGATIVE
TZAT-0002FASTVT: NEGATIVE
TZAT-0004SLOWVT: 8
TZAT-0004SLOWVT: 8
TZAT-0005SLOWVT: 84 pct
TZAT-0005SLOWVT: 91 pct
TZAT-0011SLOWVT: 10 ms
TZAT-0011SLOWVT: 10 ms
TZAT-0012ATACH: 150 ms
TZAT-0012ATACH: 150 ms
TZAT-0012ATACH: 150 ms
TZAT-0012FASTVT: 170 ms
TZAT-0012SLOWVT: 170 ms
TZAT-0012SLOWVT: 170 ms
TZAT-0013SLOWVT: 2
TZAT-0013SLOWVT: 2
TZAT-0018ATACH: NEGATIVE
TZAT-0018ATACH: NEGATIVE
TZAT-0018ATACH: NEGATIVE
TZAT-0018FASTVT: NEGATIVE
TZAT-0018SLOWVT: NEGATIVE
TZAT-0018SLOWVT: NEGATIVE
TZAT-0019ATACH: 6 V
TZAT-0019ATACH: 6 V
TZAT-0019ATACH: 6 V
TZAT-0019FASTVT: 8 V
TZAT-0019SLOWVT: 8 V
TZAT-0019SLOWVT: 8 V
TZAT-0020ATACH: 1.5 ms
TZAT-0020ATACH: 1.5 ms
TZAT-0020ATACH: 1.5 ms
TZAT-0020FASTVT: 1.5 ms
TZAT-0020SLOWVT: 1.5 ms
TZAT-0020SLOWVT: 1.5 ms
TZON-0003ATACH: 350 ms
TZON-0003SLOWVT: 350 ms
TZON-0003VSLOWVT: 400 ms
TZON-0004SLOWVT: 16
TZON-0004VSLOWVT: 20
TZON-0005SLOWVT: 12
TZST-0001ATACH: 4
TZST-0001ATACH: 5
TZST-0001ATACH: 6
TZST-0001FASTVT: 2
TZST-0001FASTVT: 3
TZST-0001FASTVT: 4
TZST-0001FASTVT: 5
TZST-0001FASTVT: 6
TZST-0001SLOWVT: 3
TZST-0001SLOWVT: 4
TZST-0001SLOWVT: 5
TZST-0001SLOWVT: 6
TZST-0002ATACH: NEGATIVE
TZST-0002ATACH: NEGATIVE
TZST-0002ATACH: NEGATIVE
TZST-0002FASTVT: NEGATIVE
TZST-0002FASTVT: NEGATIVE
TZST-0002FASTVT: NEGATIVE
TZST-0002FASTVT: NEGATIVE
TZST-0002FASTVT: NEGATIVE
TZST-0003SLOWVT: 25 J
TZST-0003SLOWVT: 35 J
TZST-0003SLOWVT: 35 J
TZST-0003SLOWVT: 35 J
VENTRICULAR PACING ICD: 99.89 pct
VF: 0

## 2013-02-08 LAB — POCT INR: INR: 2.9

## 2013-02-08 NOTE — Progress Notes (Signed)
ICD with ICM in office.

## 2013-02-18 ENCOUNTER — Other Ambulatory Visit: Payer: Self-pay

## 2013-02-18 DIAGNOSIS — I5042 Chronic combined systolic (congestive) and diastolic (congestive) heart failure: Secondary | ICD-10-CM

## 2013-02-18 MED ORDER — FUROSEMIDE 40 MG PO TABS: 40.0000 mg | ORAL_TABLET | Freq: Every day | ORAL | Status: DC

## 2013-03-01 ENCOUNTER — Encounter: Payer: Self-pay | Admitting: Internal Medicine

## 2013-03-11 ENCOUNTER — Ambulatory Visit (INDEPENDENT_AMBULATORY_CARE_PROVIDER_SITE_OTHER): Payer: Medicare Other | Admitting: Family Medicine

## 2013-03-11 DIAGNOSIS — I4891 Unspecified atrial fibrillation: Secondary | ICD-10-CM

## 2013-03-11 DIAGNOSIS — Z7901 Long term (current) use of anticoagulants: Secondary | ICD-10-CM

## 2013-03-11 DIAGNOSIS — Z5181 Encounter for therapeutic drug level monitoring: Secondary | ICD-10-CM

## 2013-03-11 LAB — POCT INR: INR: 1.7

## 2013-03-12 ENCOUNTER — Other Ambulatory Visit: Payer: Self-pay | Admitting: Family Medicine

## 2013-03-12 MED ORDER — WARFARIN SODIUM 2 MG PO TABS: 2.0000 mg | ORAL_TABLET | Freq: Every day | ORAL | Status: DC

## 2013-03-24 ENCOUNTER — Ambulatory Visit (INDEPENDENT_AMBULATORY_CARE_PROVIDER_SITE_OTHER): Payer: Medicare Other

## 2013-03-24 DIAGNOSIS — Z23 Encounter for immunization: Secondary | ICD-10-CM

## 2013-04-08 ENCOUNTER — Ambulatory Visit (INDEPENDENT_AMBULATORY_CARE_PROVIDER_SITE_OTHER): Payer: Medicare Other | Admitting: Family Medicine

## 2013-04-08 DIAGNOSIS — Z5181 Encounter for therapeutic drug level monitoring: Secondary | ICD-10-CM

## 2013-04-08 DIAGNOSIS — Z7901 Long term (current) use of anticoagulants: Secondary | ICD-10-CM

## 2013-04-08 DIAGNOSIS — I4891 Unspecified atrial fibrillation: Secondary | ICD-10-CM

## 2013-04-08 LAB — POCT INR: INR: 2.6

## 2013-04-27 ENCOUNTER — Other Ambulatory Visit: Payer: Self-pay

## 2013-04-27 MED ORDER — DISOPYRAMIDE PHOSPHATE 150 MG PO CAPS: 150.0000 mg | ORAL_CAPSULE | Freq: Two times a day (BID) | ORAL | Status: DC

## 2013-05-04 ENCOUNTER — Ambulatory Visit (INDEPENDENT_AMBULATORY_CARE_PROVIDER_SITE_OTHER): Payer: Medicare Other | Admitting: Family Medicine

## 2013-05-04 ENCOUNTER — Ambulatory Visit (INDEPENDENT_AMBULATORY_CARE_PROVIDER_SITE_OTHER): Payer: Medicare Other | Admitting: Cardiology

## 2013-05-04 ENCOUNTER — Encounter: Payer: Self-pay | Admitting: Cardiology

## 2013-05-04 ENCOUNTER — Encounter: Payer: Self-pay | Admitting: Family Medicine

## 2013-05-04 VITALS — BP 120/80 | HR 80 | Ht 67.0 in | Wt 194.5 lb

## 2013-05-04 VITALS — BP 122/68 | HR 69 | Temp 97.5°F | Ht 66.0 in | Wt 193.5 lb

## 2013-05-04 DIAGNOSIS — I4891 Unspecified atrial fibrillation: Secondary | ICD-10-CM

## 2013-05-04 DIAGNOSIS — R9431 Abnormal electrocardiogram [ECG] [EKG]: Secondary | ICD-10-CM

## 2013-05-04 DIAGNOSIS — I429 Cardiomyopathy, unspecified: Secondary | ICD-10-CM

## 2013-05-04 DIAGNOSIS — R42 Dizziness and giddiness: Secondary | ICD-10-CM

## 2013-05-04 NOTE — Progress Notes (Signed)
Pre-visit discussion using our clinic review tool. No additional management support is needed unless otherwise documented below in the visit note.  HPI:  79 year old woman with a history of hypertrophic cardiomyopathy complicated by syncope s/p ICD, afib, COPD here for several weeks of dizziness and SOB. She has noticed dizziness is worsened by changes in head position but sometimes unrelated.  She is having more DOE.  Last saw Dr. Rockey Situ in 05/2012- note reviewed.  Echo EF 55-60% no LVOT gradient. No CP or SOB. Fatigued at times.  Last carotid u/s 05/2012- 60 to 70% left carotid, 40 to 50 on right.   Patient Active Problem List   Diagnosis Date Noted  . Dizziness and giddiness 05/04/2013  . Allergic rhinitis 01/01/2013  . Chronic cholecystitis with calculus 09/15/2012  . Abdominal  pain, other specified site 09/01/2012  . Claudication 01/21/2012  . Chronic combined systolic and diastolic heart failure 0000000  . Hypertension 05/20/2011  . Avascular necrosis of hip 05/03/2011  . OTHER ACUTE SINUSITIS 03/15/2010  . FATIGUE 11/16/2009  . CARCINOMA, RENAL CELL 10/13/2009  . OTHER AND UNSPECIFIED COAGULATION DEFECTS 10/13/2009  . RECTAL BLEEDING 10/13/2009  . HOCM / IHSS 07/14/2009  . EDEMA 07/14/2009  . HYPERLIPIDEMIA 04/10/2009  . ADJUSTMENT DISORDER WITH ANXIETY 04/10/2009  . INCONTINENCE, URGE 04/10/2009  . CARDIOMYOPATHY, SECONDARY 10/27/2008  . ATRIAL FIBRILLATION 10/27/2008  . SINUS BRADYCARDIA 10/27/2008  . CAROTID ARTERY STENOSIS 10/27/2008  . COPD 10/27/2008  . RENAL INSUFFICIENCY, CHRONIC 10/27/2008  . ICD -[employer] 10/27/2008   Past Medical History  Diagnosis Date  . Carotid artery stenosis 09/2007    60-79% bilateral (stable)  . Carotid artery stenosis 10/2008    40-59% R 60-79%   . Chest pain, unspecified   . Atrial fibrillation   . Sinus bradycardia   . Hypotension, unspecified     cardiac cath 2006..nonobstructive CAD 30-40s lesions.Marland KitchenETT 1/09  nondiagnostic due to poor HR response..Right Renal Cancer 2003  . Blood in stool   . Secondary cardiomyopathy, unspecified   . Chronic airway obstruction, not elsewhere classified   . Long term (current) use of anticoagulants   . Dysuria   . Edema   . Encounter for therapeutic drug monitoring   . Other malaise and fatigue   . Hypertrophic obstructive cardiomyopathy   . Urge incontinence   . Other acute sinusitis   . Other and unspecified coagulation defects   . Other screening mammogram   . Pre-operative cardiovascular examination   . Hemorrhage of rectum and anus   . Chronic kidney disease, unspecified   . Special screening for osteoporosis   . Hypertension 05/20/2011  . PONV (postoperative nausea and vomiting)   . Adjustment disorder with anxiety   . Coronary artery disease     non-obstructive by 2006 cath  . Automatic implantable cardioverter-defibrillator in situ   . High cholesterol   . Anginal pain   . Chronic bronchitis     "get it some; not q year" (11/11/2012)  . Exertional shortness of breath   . History of blood transfusion 04/2011    "after hip OR" (11/11/2012)  . Arthritis     "some in my hands" (11/11/2012)  . Osteoarthritis of right hip   . Renal cancer 06/2001    Right  . Malignant neoplasm of kidney, except pelvis   . Frequent UTI     "get them a couple times/yr" (11/11/2012)   Past Surgical History  Procedure Laterality Date  . Abdominal hysterectomy  1975  for benign causes  . Cardiac catheterization  2006  . Nephrectomy Right 06/2001     S/P RENAL CELL CANCER  . Cataract extraction w/ intraocular lens  implant, bilateral  01/2006-02-2006  . Insert / replace / remove pacemaker  05-01-11    02-28-05-/05-04-10-ICD-MEDTRONIC MAXIMAL DR  . Appendectomy    . Total hip arthroplasty Right 05/03/2011    Procedure: TOTAL HIP ARTHROPLASTY ANTERIOR APPROACH;  Surgeon: Mcarthur Rossetti;  Location: [facility];  Service: Orthopedics;  Laterality: Right;  Removal of  Cannulated Screws Right Hip, Right Direct Anterior Hip Replacement  . Laparoscopic cholecystectomy  11/11/2012  . Bi-ventricular pacemaker upgrade  05/04/2010  . Joint replacement    . Cholecystectomy N/A 11/11/2012    Procedure: LAPAROSCOPIC CHOLECYSTECTOMY WITH INTRAOPERATIVE CHOLANGIOGRAM;  Surgeon: Imogene Burn. Georgette Dover, MD;  Location: [facility];  Service: General;  Laterality: N/A;  . Laparoscopic lysis of adhesions N/A 11/11/2012    Procedure: LAPAROSCOPIC LYSIS OF ADHESIONS;  Surgeon: Imogene Burn. Georgette Dover, MD;  Location: [facility];  Service: General;  Laterality: N/A;   History  Substance Use Topics  . Smoking status: Former Smoker -- 0.50 packs/day for 40 years    Types: Cigarettes    Quit date: 05/06/2001  . Smokeless tobacco: Never Used  . Alcohol Use: No   Family History  Problem Relation Age of Onset  . Colon cancer Neg Hx    Allergies  Allergen Reactions  . Codeine Nausea And Vomiting  . Morphine And Related Nausea And Vomiting  . Sulfonamide Derivatives     REACTION: Dry mouth   Current Outpatient Prescriptions on File Prior to Visit  Medication Sig Dispense Refill  . aspirin 81 MG EC tablet Take 81 mg by mouth daily after breakfast.       . atorvastatin (LIPITOR) 40 MG tablet Take 1 tablet (40 mg total) by mouth every evening.  30 tablet  6  . carvedilol (COREG) 12.5 MG tablet Take 1 tablet (12.5 mg total) by mouth 2 (two) times daily with a meal.  60 tablet  5  . cetirizine (ZYRTEC) 10 MG tablet Take 10 mg by mouth daily.       . disopyramide (NORPACE) 150 MG capsule Take 1 capsule (150 mg total) by mouth 2 (two) times daily.  60 capsule  6  . furosemide (LASIX) 40 MG tablet Take 1 tablet (40 mg total) by mouth daily.  30 tablet  4  . potassium chloride (K-DUR,KLOR-CON) 10 MEQ tablet Take 10 mEq by mouth 2 (two) times daily with breakfast and lunch.      . verapamil (CALAN-SR) 180 MG CR tablet Take 180 mg by mouth at bedtime.       Marland Kitchen warfarin (COUMADIN) 1 MG tablet Take as directed by  Coumadin Clinic.  30 tablet  2  . warfarin (COUMADIN) 2 MG tablet Take 1 tablet (2 mg total) by mouth daily.  30 tablet  2  . [DISCONTINUED] hydrochlorothiazide (HYDRODIURIL) 25 MG tablet Take 25 mg by mouth daily after breakfast.        No current facility-administered medications on file prior to visit.   The PMH, PSH, Social History, Family History, Medications, and allergies have been reviewed in [facility], and have been updated if relevant.  ROS:  See HPI  Physical exam: BP 122/68  Pulse 69  Temp(Src) 97.5 F (36.4 C) (Oral)  Ht 5\' 6"  (1.676 m)  Wt 193 lb 8 oz (87.771 kg)  BMI 31.25 kg/m2  SpO2 99%  General:  Well-developed,well-nourished,in no acute distress; alert,appropriate and cooperative throughout examination Head:  normocephalic and atraumatic.   Eyes:  vision grossly intact, pupils equal, pupils round, and pupils reactive to light.   Ears:  +cerumen bilaterally  Nose:  no external deformity.   Mouth:  good dentition.   Neck:  No deformities, masses, or tenderness noted. +bruits Lungs:  Normal respiratory effort, chest expands symmetrically. Lungs are clear to auscultation, no crackles or wheezes. Heart:  Normal rate and regular rhythm. 2/6 SEM Abdomen:  Bowel sounds positive,abdomen soft and non-tender without masses, organomegaly or hernias noted. Msk:  No deformity or scoliosis noted of thoracic or lumbar spine.   Extremities:  No clubbing, cyanosis, edema, or deformity noted with normal full range of motion of all joints.   Neurologic:  alert & oriented X3 and gait normal.   dix hallpike neg, no nystagmus Psych:  Cognition and judgment appear intact. Alert and cooperative with normal attention span and concentration. No apparent delusions, illusions, hallucinations  Assessment and Plan: 1. Atrial fibrillation In sinus rhythm.  Has pacer.  Does have some changes on EKG (?WPW) but difficult to read with pacer.  Due for cardiac eval and carotid dopplers.  Will refer  back to Dr. Rockey Situ.   - EKG 12-Lead - Ambulatory referral to Cardiology  2. Dizziness and giddiness See above.  Unclear if this is related to cardiac source.  DOE is worrisome.  See above. She does have some mild cerumen buildup-advised OTC debrox if there is a mild component of vertigo although this is less likely. - Ambulatory referral to Cardiology  3. EKG abnormalities  - Ambulatory referral to Cardiology

## 2013-05-04 NOTE — Patient Instructions (Signed)
Good to see you. Please stop by to see Rosaria Ferries on your way out to set up an appointment with Dr. Rockey Situ.  After you see him, if your dizziness continues, please let me know. Please let me know if it worsens.

## 2013-05-04 NOTE — Patient Instructions (Addendum)
Your physician has requested that you have an echocardiogram. Echocardiography is a painless test that uses sound waves to create images of your heart. It provides your doctor with information about the size and shape of your heart and how well your heart's chambers and valves are working. This procedure takes approximately one hour. There are no restrictions for this procedure.  Your physician recommends that you have labs drawn today. BNP.   Please keep your appt 05/12/13 at 9:10 am    Follow up with Dr. Rockey Situ after ECHO

## 2013-05-04 NOTE — Progress Notes (Signed)
HPI The patient presents for evaluation of dizziness. She was added to my schedule. She says she's this over the last 4 or 5 days. Saturday night while sitting at dinner she has significant episode of presyncope with dizziness. She did not feel her heart racing at that time. She was seen. She did not actually pass out. She didn't really have any associated symptoms. She didn't have nausea or vomiting. She wasn't excessively short of breath. However, she's had more dyspnea on exertion recently. She's also had some intermittent chest pressure when she is doing some of her household chores. This has been over several weeks and slowly progressive. However this was not associated with dizziness. She's not describing any new PND or orthopnea. She has had some weight gain because of inactivity. She does not describe necessarily orthostatic symptoms. She said the table was spinning a little bit when she was presyncopal but she's not otherwise had any problems with her vision motor or speech. She's had some less severe episodes over the last couple of days.  Allergies  Allergen Reactions  . Codeine Nausea And Vomiting  . Morphine And Related Nausea And Vomiting  . Sulfonamide Derivatives     REACTION: Dry mouth    Current Outpatient Prescriptions  Medication Sig Dispense Refill  . aspirin 81 MG EC tablet Take 81 mg by mouth daily after breakfast.       . atorvastatin (LIPITOR) 40 MG tablet Take 1 tablet (40 mg total) by mouth every evening.  30 tablet  6  . carvedilol (COREG) 12.5 MG tablet Take 1 tablet (12.5 mg total) by mouth 2 (two) times daily with a meal.  60 tablet  5  . cetirizine (ZYRTEC) 10 MG tablet Take 10 mg by mouth daily.       . disopyramide (NORPACE) 150 MG capsule Take 1 capsule (150 mg total) by mouth 2 (two) times daily.  60 capsule  6  . furosemide (LASIX) 40 MG tablet Take 1 tablet (40 mg total) by mouth daily.  30 tablet  4  . potassium chloride (K-DUR,KLOR-CON) 10 MEQ tablet  Take 10 mEq by mouth 2 (two) times daily with breakfast and lunch.      . verapamil (CALAN-SR) 180 MG CR tablet Take 180 mg by mouth at bedtime.       Marland Kitchen warfarin (COUMADIN) 1 MG tablet Take as directed by [facility].  30 tablet  2  . warfarin (COUMADIN) 2 MG tablet Take 1 tablet (2 mg total) by mouth daily.  30 tablet  2  . [DISCONTINUED] hydrochlorothiazide (HYDRODIURIL) 25 MG tablet Take 25 mg by mouth daily after breakfast.        No current facility-administered medications for this visit.    Past Medical History  Diagnosis Date  . Carotid artery stenosis 09/2007    60-79% bilateral (stable)  . Carotid artery stenosis 10/2008    40-59% R 60-79%   . Chest pain, unspecified   . Atrial fibrillation   . Sinus bradycardia   . Hypotension, unspecified     cardiac cath 2006..nonobstructive CAD 30-40s lesions.Marland KitchenETT 1/09 nondiagnostic due to poor HR response..Right Renal Cancer 2003  . Blood in stool   . Secondary cardiomyopathy, unspecified   . Chronic airway obstruction, not elsewhere classified   . Long term (current) use of anticoagulants   . Dysuria   . Edema   . Encounter for therapeutic drug monitoring   . Other malaise and fatigue   . Hypertrophic obstructive  cardiomyopathy   . Urge incontinence   . Other acute sinusitis   . Other and unspecified coagulation defects   . Other screening mammogram   . Pre-operative cardiovascular examination   . Hemorrhage of rectum and anus   . Chronic kidney disease, unspecified   . Special screening for osteoporosis   . Hypertension 05/20/2011  . PONV (postoperative nausea and vomiting)   . Adjustment disorder with anxiety   . Coronary artery disease     non-obstructive by 2006 cath  . Automatic implantable cardioverter-defibrillator in situ   . High cholesterol   . Anginal pain   . Chronic bronchitis     "get it some; not q year" (11/11/2012)  . Exertional shortness of breath   . History of blood transfusion 04/2011    "after hip  OR" (11/11/2012)  . Arthritis     "some in my hands" (11/11/2012)  . Osteoarthritis of right hip   . Renal cancer 06/2001    Right  . Malignant neoplasm of kidney, except pelvis   . Frequent UTI     "get them a couple times/yr" (11/11/2012)    Past Surgical History  Procedure Laterality Date  . Abdominal hysterectomy  1975    for benign causes  . Cardiac catheterization  2006  . Nephrectomy Right 06/2001     S/P RENAL CELL CANCER  . Cataract extraction w/ intraocular lens  implant, bilateral  01/2006-02-2006  . Insert / replace / remove pacemaker  05-01-11    02-28-05-/05-04-10-ICD-MEDTRONIC MAXIMAL DR  . Appendectomy    . Total hip arthroplasty Right 05/03/2011    Procedure: TOTAL HIP ARTHROPLASTY ANTERIOR APPROACH;  Surgeon: Mcarthur Rossetti;  Location: [facility];  Service: Orthopedics;  Laterality: Right;  Removal of Cannulated Screws Right Hip, Right Direct Anterior Hip Replacement  . Laparoscopic cholecystectomy  11/11/2012  . Bi-ventricular pacemaker upgrade  05/04/2010  . Joint replacement    . Cholecystectomy N/A 11/11/2012    Procedure: LAPAROSCOPIC CHOLECYSTECTOMY WITH INTRAOPERATIVE CHOLANGIOGRAM;  Surgeon: Imogene Burn. Georgette Dover, MD;  Location: [place];  Service: General;  Laterality: N/A;  . Laparoscopic lysis of adhesions N/A 11/11/2012    Procedure: LAPAROSCOPIC LYSIS OF ADHESIONS;  Surgeon: Imogene Burn. Georgette Dover, MD;  Location: [place];  Service: General;  Laterality: N/A;    ROS:  As stated in the HPI and negative for all other systems.  PHYSICAL EXAM BP 120/80  Pulse 62  Ht 5\' 7"  (1.702 m)  Wt 194 lb 8 oz (88.225 kg)  BMI 30.46 kg/m2 GENERAL:  Well appearing HEENT:  Pupils equal round and reactive, fundi not visualized, oral mucosa unremarkable NECK:  No jugular venous distention, waveform within normal limits, carotid upstroke brisk and symmetric, bilateral carotid bruits, no thyromegaly LYMPHATICS:  No cervical, inguinal adenopathy LUNGS:  Clear to auscultation  bilaterally BACK:  No CVA tenderness CHEST:  Unremarkable HEART:  PMI not displaced or sustained,S1 and S2 within normal limits, no S3, no S4, no clicks, no rubs, 2/6 apical systolic murmur radiating slightly out the aortic outflow tract, it does not increase with strain phase of Valsalva, I do not appreciate diastolic murmurs ABD:  Flat, positive bowel sounds normal in frequency in pitch, no bruits, no rebound, no guarding, no midline pulsatile mass, no hepatomegaly, no splenomegaly EXT:  2 plus pulses throughout, no edema, no cyanosis no clubbing SKIN:  No rashes no nodules NEURO:  Cranial nerves II through XII grossly intact, motor grossly intact throughout PSYCH:  Cognitively intact, oriented to person  place and time   EKG:  Sinus rhythm with ventricular pacing 100% better, rate 64  05/04/2013  ASSESSMENT AND PLAN  DIZZINESS:  This is the predominant complaint. The etiology of this is not clear.  I will have her device interrogated.  I will check  an echocardiogram as I do not see one since 2011.  ATRIAL FIB:  I don't strongly suspect that she is having recurrence of this.  However, we will get her device interoggated.  For now she will continue the meds as listed.    HCM:  I will check an echocardiogram as above.  With her increasing dyspnea I wi also check a BNP low. If the above testing is unremarkable I would consider stress testing with her progressive dyspnea.  ICD:  This will be checked as above.

## 2013-05-05 ENCOUNTER — Other Ambulatory Visit: Payer: Self-pay

## 2013-05-05 ENCOUNTER — Ambulatory Visit: Payer: Medicare Other

## 2013-05-05 ENCOUNTER — Telehealth: Payer: Self-pay

## 2013-05-05 DIAGNOSIS — R42 Dizziness and giddiness: Secondary | ICD-10-CM

## 2013-05-05 LAB — PRO B NATRIURETIC PEPTIDE: B-Type Natriuretic Peptide: 1539 pg/mL — ABNORMAL HIGH (ref 0–450)

## 2013-05-05 NOTE — Telephone Encounter (Signed)
Spoke w/ pt and informed her that her BNP drawn yesterday was unable to be processed by [facility], as they report they received room temp sample instead of frozen. Asked pt to go have repeat labs drawn at [facility]. Pt is agreeable to this and will go later today for labs.

## 2013-05-07 ENCOUNTER — Ambulatory Visit: Payer: Medicare Other

## 2013-05-07 ENCOUNTER — Other Ambulatory Visit: Payer: Self-pay | Admitting: *Deleted

## 2013-05-07 DIAGNOSIS — R42 Dizziness and giddiness: Secondary | ICD-10-CM

## 2013-05-07 LAB — BRAIN NATRIURETIC PEPTIDE

## 2013-05-12 ENCOUNTER — Encounter: Payer: Medicare Other | Admitting: *Deleted

## 2013-05-12 ENCOUNTER — Other Ambulatory Visit: Payer: Self-pay

## 2013-05-12 MED ORDER — POTASSIUM CHLORIDE CRYS ER 10 MEQ PO TBCR: 10.0000 meq | EXTENDED_RELEASE_TABLET | Freq: Two times a day (BID) | ORAL | Status: DC

## 2013-05-14 ENCOUNTER — Other Ambulatory Visit: Payer: Medicare Other

## 2013-05-14 ENCOUNTER — Ambulatory Visit (INDEPENDENT_AMBULATORY_CARE_PROVIDER_SITE_OTHER): Payer: Medicare Other

## 2013-05-14 DIAGNOSIS — R42 Dizziness and giddiness: Secondary | ICD-10-CM

## 2013-05-14 DIAGNOSIS — I4891 Unspecified atrial fibrillation: Secondary | ICD-10-CM

## 2013-05-14 DIAGNOSIS — Z79899 Other long term (current) drug therapy: Secondary | ICD-10-CM

## 2013-05-17 ENCOUNTER — Other Ambulatory Visit: Payer: Self-pay | Admitting: Family Medicine

## 2013-05-17 ENCOUNTER — Ambulatory Visit (INDEPENDENT_AMBULATORY_CARE_PROVIDER_SITE_OTHER): Payer: Medicare Other | Admitting: Family Medicine

## 2013-05-17 DIAGNOSIS — I4891 Unspecified atrial fibrillation: Secondary | ICD-10-CM

## 2013-05-17 DIAGNOSIS — Z5181 Encounter for therapeutic drug level monitoring: Secondary | ICD-10-CM

## 2013-05-17 DIAGNOSIS — Z7901 Long term (current) use of anticoagulants: Secondary | ICD-10-CM

## 2013-05-17 LAB — POCT INR: INR: 1.6

## 2013-05-18 ENCOUNTER — Other Ambulatory Visit: Payer: Self-pay

## 2013-05-18 ENCOUNTER — Other Ambulatory Visit (INDEPENDENT_AMBULATORY_CARE_PROVIDER_SITE_OTHER): Payer: Medicare Other

## 2013-05-18 DIAGNOSIS — R0602 Shortness of breath: Secondary | ICD-10-CM

## 2013-05-18 DIAGNOSIS — I429 Cardiomyopathy, unspecified: Secondary | ICD-10-CM

## 2013-05-18 DIAGNOSIS — I369 Nonrheumatic tricuspid valve disorder, unspecified: Secondary | ICD-10-CM

## 2013-05-18 DIAGNOSIS — I059 Rheumatic mitral valve disease, unspecified: Secondary | ICD-10-CM

## 2013-05-19 ENCOUNTER — Encounter: Payer: Self-pay | Admitting: *Deleted

## 2013-05-19 ENCOUNTER — Telehealth: Payer: Self-pay

## 2013-05-19 NOTE — Telephone Encounter (Signed)
Her creat was slightly elevated.  I would ask that this be repeated in about one week.  The patient should follow up with Dr. Rockey Situ and I will send this note.  I saw her when he was out of the office.

## 2013-05-19 NOTE — Telephone Encounter (Signed)
Pt would like lab results.  

## 2013-05-20 ENCOUNTER — Other Ambulatory Visit: Payer: Self-pay

## 2013-05-20 DIAGNOSIS — N289 Disorder of kidney and ureter, unspecified: Secondary | ICD-10-CM

## 2013-05-20 NOTE — Telephone Encounter (Signed)
Reviewed results w/ pt.  She reports that she is still having swelling and is concerned, as she only has one kidney. Advised pt to wear her compression hose and keep her feel elevated when possible.  She reports that she has TED hose but has not been wearing them. After discussion, she is agreeable to wearing them. Pt to come in for repeat labs on 05/27/13 @ 11:00 and see Dr. Rockey Situ on 06/04/13.

## 2013-05-24 ENCOUNTER — Ambulatory Visit: Payer: Medicare Other | Admitting: Cardiovascular Disease

## 2013-05-27 ENCOUNTER — Other Ambulatory Visit: Payer: Medicare Other

## 2013-06-04 ENCOUNTER — Ambulatory Visit (INDEPENDENT_AMBULATORY_CARE_PROVIDER_SITE_OTHER): Payer: Medicare Other | Admitting: Cardiovascular Disease

## 2013-06-04 ENCOUNTER — Encounter: Payer: Self-pay | Admitting: Cardiovascular Disease

## 2013-06-04 ENCOUNTER — Other Ambulatory Visit: Payer: Self-pay

## 2013-06-04 VITALS — BP 144/80 | HR 64 | Ht 67.0 in | Wt 193.5 lb

## 2013-06-04 DIAGNOSIS — I6529 Occlusion and stenosis of unspecified carotid artery: Secondary | ICD-10-CM

## 2013-06-04 DIAGNOSIS — I10 Essential (primary) hypertension: Secondary | ICD-10-CM

## 2013-06-04 DIAGNOSIS — I38 Endocarditis, valve unspecified: Secondary | ICD-10-CM | POA: Insufficient documentation

## 2013-06-04 DIAGNOSIS — I059 Rheumatic mitral valve disease, unspecified: Secondary | ICD-10-CM

## 2013-06-04 DIAGNOSIS — I5032 Chronic diastolic (congestive) heart failure: Secondary | ICD-10-CM

## 2013-06-04 DIAGNOSIS — I340 Nonrheumatic mitral (valve) insufficiency: Secondary | ICD-10-CM | POA: Insufficient documentation

## 2013-06-04 DIAGNOSIS — I4891 Unspecified atrial fibrillation: Secondary | ICD-10-CM

## 2013-06-04 DIAGNOSIS — R079 Chest pain, unspecified: Secondary | ICD-10-CM

## 2013-06-04 DIAGNOSIS — E785 Hyperlipidemia, unspecified: Secondary | ICD-10-CM

## 2013-06-04 DIAGNOSIS — I509 Heart failure, unspecified: Secondary | ICD-10-CM

## 2013-06-04 DIAGNOSIS — N189 Chronic kidney disease, unspecified: Secondary | ICD-10-CM

## 2013-06-04 HISTORY — DX: Chronic diastolic (congestive) heart failure: I50.32

## 2013-06-04 MED ORDER — OMEPRAZOLE 20 MG PO CPDR: 20.0000 mg | DELAYED_RELEASE_CAPSULE | Freq: Two times a day (BID) | ORAL | Status: DC

## 2013-06-04 NOTE — Assessment & Plan Note (Addendum)
Please stay on Lipitor. Okay to take several weeks off to see if this helps leg discomfort. Goal LDL less than 70

## 2013-06-04 NOTE — Assessment & Plan Note (Signed)
Blood pressure is well controlled on today's visit. No changes made to the medications. 

## 2013-06-04 NOTE — Progress Notes (Signed)
Patient ID: [name], female    DOB: 06/28/1933, 80 y.o.   MRN: [id number]  HPI Comments: [name] is a 80 year old woman with a history of syncope, COPD, as well as symptomatic bradycardia.  She is status post ICD.  She also has a history of nonobstructive coronary artery disease with chronic chest pain, bilateral carotid artery stenosis, paroxysmal atrial fibrillation, renal cell carcinoma status post resection of her right kidney with residual chronic renal insufficiency, and COPD.     right hip replacement on May 03 2011.     Chronic dizziness since the summer of 2014 . Symptoms were worse in December 2014, now improved but still mild . Symptoms seem to happen at rest, sometimes with standing, not particularly when doing orthostatic maneuvers .  Echocardiogram was ordered for dizziness and shortness of breath. He showed normal ejection fraction, moderate, possibly moderate to severe mitral valve regurgitation, moderate TR, moderately elevated right ventricular systolic pressures.  BNP was 1500 in early January 2015. She was started on Lasix 40 mg increased up to twice a day dosing. No significant change in her weight. She continues to drink significant amounts of water. Still feels she has extra fluid, still with leg edema No recent episodes of near syncope or syncope. Rare palpitations. She is concerned that these could be from atrial fibrillation. They're very short lived . Also with atypical type chest pain radiating from under her left breast over to the right side, recent pain that felt better with pushing on her left breast. . She was told that she had significant adhesions in the past. She does have muscle ache below the knees bilaterally in her shins  stress test in March 2011.    Last carotid u/s  6/10; 40-59% R 60-79%. No neuro symptoms.     Cardiac cath 2006    --non-obstructive CAD 30-40s lesions She reports having repeat catheterization since that time with no  significant progression of her disease  EKG shows AV paced rhythm, rate of 71 beats per minute    Outpatient Encounter Prescriptions as of 06/04/2013  Medication Sig  . aspirin 81 MG EC tablet Take 81 mg by mouth daily after breakfast.   . atorvastatin (LIPITOR) 40 MG tablet Take 1 tablet (40 mg total) by mouth every evening.  . carvedilol (COREG) 12.5 MG tablet Take 1 tablet (12.5 mg total) by mouth 2 (two) times daily with a meal.  . cetirizine (ZYRTEC) 10 MG tablet Take 10 mg by mouth daily.   . disopyramide (NORPACE) 150 MG capsule Take 1 capsule (150 mg total) by mouth 2 (two) times daily.  . furosemide (LASIX) 40 MG tablet Take 40 mg by mouth 2 (two) times daily.  . potassium chloride (K-DUR,KLOR-CON) 10 MEQ tablet Take 1 tablet (10 mEq total) by mouth 2 (two) times daily with breakfast and lunch.  . verapamil (CALAN-SR) 180 MG CR tablet Take 180 mg by mouth at bedtime.   Marland Kitchen warfarin (COUMADIN) 1 MG tablet TAKE AS DIRECTED BY COUMADIN CLINIC  . warfarin (COUMADIN) 2 MG tablet Take 1 tablet (2 mg total) by mouth daily.    Review of Systems  Constitutional: Negative.   HENT: Negative.   Eyes: Negative.   Respiratory: Positive for shortness of breath.   Cardiovascular: Positive for leg swelling.  Gastrointestinal: Negative.   Endocrine: Negative.   Musculoskeletal: Negative.   Skin: Negative.   Allergic/Immunologic: Negative.   Neurological: Positive for dizziness.  Hematological: Negative.   Psychiatric/Behavioral: Negative.  All other systems reviewed and are negative.    BP 144/80  Pulse 64  Ht 5\' 7"  (1.702 m)  Wt 193 lb 8 oz (87.771 kg)  BMI 30.30 kg/m2  Physical Exam  Nursing note and vitals reviewed. Constitutional: She is oriented to person, place, and time. She appears well-developed and well-nourished.  HENT:  Head: Normocephalic.  Nose: Nose normal.  Mouth/Throat: Oropharynx is clear and moist.  Eyes: Conjunctivae are normal. Pupils are equal, round, and  reactive to light.  Neck: Normal range of motion. Neck supple. No JVD present.  Cardiovascular: Normal rate, regular rhythm, S1 normal, S2 normal, normal heart sounds and intact distal pulses.  Exam reveals no gallop and no friction rub.   No murmur heard. Pulmonary/Chest: Effort normal and breath sounds normal. No respiratory distress. She has no wheezes. She has no rales. She exhibits no tenderness.  Abdominal: Soft. Bowel sounds are normal. She exhibits no distension. There is no tenderness.  Musculoskeletal: Normal range of motion. She exhibits no edema and no tenderness.  Lymphadenopathy:    She has no cervical adenopathy.  Neurological: She is alert and oriented to person, place, and time. Coordination normal.  Skin: Skin is warm and dry. No rash noted. No erythema.  Psychiatric: She has a normal mood and affect. Her behavior is normal. Judgment and thought content normal.    Assessment and Plan

## 2013-06-04 NOTE — Assessment & Plan Note (Signed)
We'll continue anticoagulation. She was told that insurance will not cover norpace. Other options would include low-dose flecainide, even Rythmol. We'll try to avoid amiodarone. She'll talk to her pharmacist about the cost options

## 2013-06-04 NOTE — Assessment & Plan Note (Signed)
Moderate carotid arterial disease. Continue aggressive cholesterol management

## 2013-06-04 NOTE — Assessment & Plan Note (Signed)
Images were reviewed. Appears to be moderate, possibly moderate to severe regurgitation. Review of previous echocardiogram reports suggests progression from mild-to-moderate regurgitation. She does have worsening shortness of breath. We have suggested she continue on her Lasix. We did discuss doing a TEE. We will discuss this with her again  if no improvement in her shortness of breath in followup on diuresis

## 2013-06-04 NOTE — Patient Instructions (Addendum)
You are doing well. Please start omeprazole once or twice a day (GERD, heartburn)  Ask the pharmacist about the cost of flecainide 50 mg twice a day (instead of norpace), Or rhythmol and rhythmol SR  Add Co enz Q 10 (OTC) for leg pain   OK to hold atorvastatin/lipitor for a few weeks to see if leg pain gets better  One possibly pain pill (kidney safe), is tramadol.   CAll when you want to check the kidney function  Please call us if you have new issues that need to be addressed before your next appt.  Your physician wants you to follow-up in: 3 months.

## 2013-06-04 NOTE — Assessment & Plan Note (Signed)
Encouraged her to stay on her Lasix 40 mg twice a day. She did try to decrease her fluid intake as she does drink significant volume of water. Order written to recheck her basic metabolic panel. Recommended she do this in the next several weeks once she feels her shortness of breath, fluid overload, edema has improved

## 2013-06-04 NOTE — Assessment & Plan Note (Signed)
Most recent creatinine 1.55 which is around her baseline. Suggested she check her renal function with additional diuresis over the next several weeks.

## 2013-06-07 ENCOUNTER — Ambulatory Visit (INDEPENDENT_AMBULATORY_CARE_PROVIDER_SITE_OTHER): Payer: Medicare Other | Admitting: Family Medicine

## 2013-06-07 DIAGNOSIS — I4891 Unspecified atrial fibrillation: Secondary | ICD-10-CM

## 2013-06-07 DIAGNOSIS — Z5181 Encounter for therapeutic drug level monitoring: Secondary | ICD-10-CM

## 2013-06-07 DIAGNOSIS — Z01810 Encounter for preprocedural cardiovascular examination: Secondary | ICD-10-CM | POA: Insufficient documentation

## 2013-06-07 DIAGNOSIS — Z7901 Long term (current) use of anticoagulants: Secondary | ICD-10-CM

## 2013-06-07 DIAGNOSIS — Z7902 Long term (current) use of antithrombotics/antiplatelets: Secondary | ICD-10-CM

## 2013-06-07 LAB — POCT INR: INR: 2.1

## 2013-06-11 ENCOUNTER — Other Ambulatory Visit: Payer: Self-pay | Admitting: Family Medicine

## 2013-06-14 ENCOUNTER — Ambulatory Visit: Payer: Medicare Other | Admitting: Internal Medicine

## 2013-06-14 ENCOUNTER — Ambulatory Visit (INDEPENDENT_AMBULATORY_CARE_PROVIDER_SITE_OTHER): Payer: Medicare Other | Admitting: Family Medicine

## 2013-06-14 ENCOUNTER — Encounter: Payer: Self-pay | Admitting: Family Medicine

## 2013-06-14 VITALS — BP 124/62 | HR 73 | Temp 98.2°F | Wt 194.5 lb

## 2013-06-14 DIAGNOSIS — R079 Chest pain, unspecified: Secondary | ICD-10-CM

## 2013-06-14 DIAGNOSIS — J019 Acute sinusitis, unspecified: Secondary | ICD-10-CM

## 2013-06-14 MED ORDER — AMOXICILLIN 875 MG PO TABS: 875.0000 mg | ORAL_TABLET | Freq: Two times a day (BID) | ORAL | Status: DC

## 2013-06-14 NOTE — Progress Notes (Signed)
SUBJECTIVE:  [name] is a 80 y.o. female who complains of coryza, congestion, sneezing, sore throat and bilateral sinus pain for 8 days. She denies a history of anorexia, chest pain and chills and denies a history of asthma. Patient denies smoke cigarettes.   Patient Active Problem List   Diagnosis Date Noted  . Encounter for long-term (current) use of antiplatelets/antithrombotics 06/07/2013  . Chronic diastolic CHF (congestive heart failure) 06/04/2013  . Mitral valve regurgitation 06/04/2013  . Dizziness and giddiness 05/04/2013  . Allergic rhinitis 01/01/2013  . Chronic cholecystitis with calculus 09/15/2012  . Abdominal  pain, other specified site 09/01/2012  . Claudication 01/21/2012  . Chronic combined systolic and diastolic heart failure 0000000  . Hypertension 05/20/2011  . Avascular necrosis of hip 05/03/2011  . OTHER ACUTE SINUSITIS 03/15/2010  . FATIGUE 11/16/2009  . CARCINOMA, RENAL CELL 10/13/2009  . OTHER AND UNSPECIFIED COAGULATION DEFECTS 10/13/2009  . RECTAL BLEEDING 10/13/2009  . HOCM / IHSS 07/14/2009  . EDEMA 07/14/2009  . HYPERLIPIDEMIA 04/10/2009  . ADJUSTMENT DISORDER WITH ANXIETY 04/10/2009  . INCONTINENCE, URGE 04/10/2009  . CARDIOMYOPATHY, SECONDARY 10/27/2008  . ATRIAL FIBRILLATION 10/27/2008  . SINUS BRADYCARDIA 10/27/2008  . CAROTID ARTERY STENOSIS 10/27/2008  . COPD 10/27/2008  . RENAL INSUFFICIENCY, CHRONIC 10/27/2008  . ICD -Medtronic 10/27/2008   Past Medical History  Diagnosis Date  . Carotid artery stenosis 09/2007    60-79% bilateral (stable)  . Carotid artery stenosis 10/2008    40-59% R 60-79%   . Chest pain, unspecified   . Atrial fibrillation   . Sinus bradycardia   . Hypotension, unspecified     cardiac cath 2006..nonobstructive CAD 30-40s lesions.Marland KitchenETT 1/09 nondiagnostic due to poor HR response..Right Renal Cancer 2003  . Blood in stool   . Secondary cardiomyopathy, unspecified   . Chronic airway obstruction, not  elsewhere classified   . Long term (current) use of anticoagulants   . Dysuria   . Edema   . Encounter for therapeutic drug monitoring   . Other malaise and fatigue   . Hypertrophic obstructive cardiomyopathy   . Urge incontinence   . Other acute sinusitis   . Other and unspecified coagulation defects   . Other screening mammogram   . Pre-operative cardiovascular examination   . Hemorrhage of rectum and anus   . Chronic kidney disease, unspecified   . Special screening for osteoporosis   . Hypertension 05/20/2011  . PONV (postoperative nausea and vomiting)   . Adjustment disorder with anxiety   . Coronary artery disease     non-obstructive by 2006 cath  . Automatic implantable cardioverter-defibrillator in situ   . High cholesterol   . Anginal pain   . Chronic bronchitis     "get it some; not q year" (11/11/2012)  . Exertional shortness of breath   . History of blood transfusion 04/2011    "after hip OR" (11/11/2012)  . Arthritis     "some in my hands" (11/11/2012)  . Osteoarthritis of right hip   . Renal cancer 06/2001    Right  . Malignant neoplasm of kidney, except pelvis   . Frequent UTI     "get them a couple times/yr" (11/11/2012)  . Chronic diastolic CHF (congestive heart failure) 06/04/2013   Past Surgical History  Procedure Laterality Date  . Abdominal hysterectomy  1975    for benign causes  . Cardiac catheterization  2006  . Nephrectomy Right 06/2001     S/P RENAL CELL CANCER  . Cataract extraction  w/ intraocular lens  implant, bilateral  01/2006-02-2006  . Insert / replace / remove pacemaker  05-01-11    02-28-05-/05-04-10-ICD-MEDTRONIC MAXIMAL DR  . Appendectomy    . Total hip arthroplasty Right 05/03/2011    Procedure: TOTAL HIP ARTHROPLASTY ANTERIOR APPROACH;  Surgeon: Mcarthur Rossetti;  Location: [facility];  Service: Orthopedics;  Laterality: Right;  Removal of Cannulated Screws Right Hip, Right Direct Anterior Hip Replacement  . Laparoscopic cholecystectomy   11/11/2012  . Bi-ventricular pacemaker upgrade  05/04/2010  . Joint replacement    . Cholecystectomy N/A 11/11/2012    Procedure: LAPAROSCOPIC CHOLECYSTECTOMY WITH INTRAOPERATIVE CHOLANGIOGRAM;  Surgeon: Imogene Burn. Georgette Dover, MD;  Location: [place];  Service: General;  Laterality: N/A;  . Laparoscopic lysis of adhesions N/A 11/11/2012    Procedure: LAPAROSCOPIC LYSIS OF ADHESIONS;  Surgeon: Imogene Burn. Georgette Dover, MD;  Location: [place] [facility];  Service: General;  Laterality: N/A;   History  Substance Use Topics  . Smoking status: Former Smoker -- 0.50 packs/day for 40 years    Types: Cigarettes    Quit date: 05/06/2001  . Smokeless tobacco: Never Used  . Alcohol Use: No   Family History  Problem Relation Age of Onset  . Colon cancer Neg Hx   . Heart failure Mother    Allergies  Allergen Reactions  . Codeine Nausea And Vomiting  . Morphine And Related Nausea And Vomiting  . Sulfonamide Derivatives     REACTION: Dry mouth   Current Outpatient Prescriptions on File Prior to Visit  Medication Sig Dispense Refill  . aspirin 81 MG EC tablet Take 81 mg by mouth daily after breakfast.       . atorvastatin (LIPITOR) 40 MG tablet Take 1 tablet (40 mg total) by mouth every evening.  30 tablet  6  . carvedilol (COREG) 12.5 MG tablet Take 1 tablet (12.5 mg total) by mouth 2 (two) times daily with a meal.  60 tablet  5  . cetirizine (ZYRTEC) 10 MG tablet Take 10 mg by mouth daily.       . disopyramide (NORPACE) 150 MG capsule Take 1 capsule (150 mg total) by mouth 2 (two) times daily.  60 capsule  6  . furosemide (LASIX) 40 MG tablet Take 40 mg by mouth 2 (two) times daily.      Marland Kitchen omeprazole (PRILOSEC) 20 MG capsule Take 1 capsule (20 mg total) by mouth 2 (two) times daily before a meal.  60 capsule  6  . potassium chloride (K-DUR,KLOR-CON) 10 MEQ tablet Take 1 tablet (10 mEq total) by mouth 2 (two) times daily with breakfast and lunch.  60 tablet  6  . verapamil (CALAN-SR) 180 MG CR tablet Take 180 mg by mouth at  bedtime.       Marland Kitchen warfarin (COUMADIN) 1 MG tablet TAKE AS DIRECTED BY COUMADIN CLINIC  30 tablet  2  . warfarin (COUMADIN) 2 MG tablet TAKE 1 TABLET BY MOUTH ONCE A DAY  30 tablet  2  . [DISCONTINUED] hydrochlorothiazide (HYDRODIURIL) 25 MG tablet Take 25 mg by mouth daily after breakfast.        No current facility-administered medications on file prior to visit.   The PMH, PSH, Social History, Family History, Medications, and allergies have been reviewed in [facility], and have been updated if relevant.  OBJECTIVE: BP 124/62  Pulse 73  Temp(Src) 98.2 F (36.8 C) (Oral)  Wt 194 lb 8 oz (88.225 kg)  SpO2 96%  She appears well, vital signs are as noted. Ears  normal.  Throat and pharynx normal.  Neck supple. No adenopathy in the neck. Nose is congested. Sinuses non tender. The chest is clear, without wheezes or rales.  ASSESSMENT:  viral upper respiratory illness and sinusitis  PLAN: Given duration and progression of symptoms, will treat for bacterial sinusitis.  Symptomatic therapy suggested: push fluids, rest and return office visit prn if symptoms persist or worsen.  Call or return to clinic prn if these symptoms worsen or fail to improve as anticipated.

## 2013-06-14 NOTE — Progress Notes (Signed)
Pre-visit discussion using our clinic review tool. No additional management support is needed unless otherwise documented below in the visit note.  

## 2013-06-14 NOTE — Patient Instructions (Signed)
Take amoxicillin as directed- 1 tablet twice daily for 10 days.  Drink lots of fluids.    Treat sympotmatically with Mucinex, nasal saline irrigation, and Tylenol/Ibuprofen.  Get your coumadin level rechecked after you finish your antibiotics.   Call if not improving as expected in 5-7 days.

## 2013-06-15 ENCOUNTER — Ambulatory Visit: Payer: Medicare Other | Admitting: Family Medicine

## 2013-06-15 ENCOUNTER — Other Ambulatory Visit: Payer: Self-pay

## 2013-06-15 MED ORDER — CARVEDILOL 12.5 MG PO TABS: 12.5000 mg | ORAL_TABLET | Freq: Two times a day (BID) | ORAL | Status: DC

## 2013-06-15 NOTE — Telephone Encounter (Signed)
Refill sent for carvedilol 12.5 mg take one tablet twice a day.

## 2013-06-25 ENCOUNTER — Telehealth: Payer: Self-pay

## 2013-06-25 MED ORDER — FLECAINIDE ACETATE 50 MG PO TABS: 50.0000 mg | ORAL_TABLET | Freq: Two times a day (BID) | ORAL | Status: DC

## 2013-06-25 NOTE — Telephone Encounter (Signed)
Pt called and asks if Dr. Rockey Situ wants her to start taking  Flecainide 50 mg twice a day. She did check with her pharmacy and it is less expensive for her. Please call.

## 2013-06-25 NOTE — Telephone Encounter (Signed)
Spoke w/ pt.  She was instructed on her last ov to see if her ins would cover flecainide 50mg  bid, she is calling to request that this be sent in for her.  Advised that I was sending to her pharmacy.  Pt asked if Dr. Rockey Situ had a chance to "study" her echo and "make a decision about what to do with my valve".  She states that "if it can be fixed with medicine, I would like that", but would like to know if Dr. Rockey Situ recommends other interventions.  Please advise.  Thank you.

## 2013-06-28 NOTE — Telephone Encounter (Signed)
Would start flecainide 50 mg twice a day Hold Norpace Monitor her heart rate  Still trying to salvage some old echoes from previous imaging system for image review

## 2013-06-30 NOTE — Telephone Encounter (Signed)
Spoke w/ pt and advised her of Dr. Donivan Scull recommendation.  She verbalizes understanding and will call w/ any questions or concerns.

## 2013-07-05 ENCOUNTER — Ambulatory Visit (INDEPENDENT_AMBULATORY_CARE_PROVIDER_SITE_OTHER): Payer: Medicare Other | Admitting: Family Medicine

## 2013-07-05 ENCOUNTER — Ambulatory Visit: Payer: Medicare Other | Admitting: Cardiovascular Disease

## 2013-07-05 ENCOUNTER — Telehealth: Payer: Self-pay | Admitting: *Deleted

## 2013-07-05 DIAGNOSIS — I4891 Unspecified atrial fibrillation: Secondary | ICD-10-CM

## 2013-07-05 DIAGNOSIS — Z5181 Encounter for therapeutic drug level monitoring: Secondary | ICD-10-CM

## 2013-07-05 DIAGNOSIS — R42 Dizziness and giddiness: Secondary | ICD-10-CM

## 2013-07-05 DIAGNOSIS — Z7902 Long term (current) use of antithrombotics/antiplatelets: Secondary | ICD-10-CM

## 2013-07-05 DIAGNOSIS — Z7901 Long term (current) use of anticoagulants: Secondary | ICD-10-CM

## 2013-07-05 LAB — POCT INR: INR: 3.2

## 2013-07-05 NOTE — Telephone Encounter (Signed)
Patient called and she isn't feeling well since starting new medication (flecainide 50 mg) more arrythemia. Very sob and dizzy spells.

## 2013-07-06 NOTE — Telephone Encounter (Signed)
Spoke w/ pt.  She reports that since she started the flecainide last Saturday, she has been nauseous, lethargic and SOB. She reports that she was dizzy a little while ago and "just fell over in the chair". Advised pt to monitor BP and HR, but she states that she does not have a BP monitor at home.  Instructed pt on how to find her pulse, put a cup of water in the microwave for a minute and count her heart rate for that time and that I when I call her back this afternoon, I would like some readings.  She is appreciative of this and will await a phone call as to whether or not she should continue on the flecainide or if Dr. Rockey Situ would like to change her to something else. Please advise.   Thank you.

## 2013-07-06 NOTE — Telephone Encounter (Signed)
Perhaps the thing to do would be to stop the flecainide Wait until she is feeling better And would order a 30 day monitor to look for episodes of atrial fibrillation, cause of her dizziness

## 2013-07-06 NOTE — Telephone Encounter (Signed)
Spoke w/ pt.  She verbalizes understanding and is agreeable to plan.

## 2013-07-09 ENCOUNTER — Telehealth: Payer: Self-pay

## 2013-07-09 MED ORDER — DISOPYRAMIDE PHOSPHATE 150 MG PO CAPS
150.0000 mg | ORAL_CAPSULE | Freq: Two times a day (BID) | ORAL | Status: DC
Start: 2013-07-09 — End: 2014-02-02

## 2013-07-09 NOTE — Telephone Encounter (Signed)
Pt called and states she would like a nurse to call her, she is not feeling any better.

## 2013-07-09 NOTE — Telephone Encounter (Signed)
Spoke w/ pt.  She reports that she is not feeling any better. Reports that she is having more arrythmia, her HR has been b/t 50-70 and she can feel it skip when she checks her pulse. She states that her dizziness and SOB have gotten worse since she switched from norpace to flecainide.  She has been holding flecainide since our conversation the other day. She states that her ins will not pay for norpace, but she will gladly pay for it out of pocket. Reports that she feels so bad that she has to stop 6-8 times while she is vacuuming. She is willing to wear monitor, but would like Dr. Donivan Scull advise on how to feel better soon. Please advise.  Thank you.

## 2013-07-09 NOTE — Telephone Encounter (Signed)
Spoke w/ pt.  She reports that she is feeling much better than she did this am and is feeling stronger. States that does not yet have a BP cuff and she is aware that she needs to get one.  Reports HR today in the 50-60s and it is still skipping.   She is quite eager to go back on Norpace. Per Dr. Rockey Situ, sent this to her pharmacy. Advised pt to call me on Monday and let me know what her readings are over the weekend and come in for EKG if she is still feeling bad.

## 2013-07-12 ENCOUNTER — Telehealth: Payer: Self-pay | Admitting: *Deleted

## 2013-07-12 NOTE — Telephone Encounter (Signed)
Attempted to contact pt. No answer, no machine.  

## 2013-07-12 NOTE — Telephone Encounter (Signed)
Pt requires prior authorization for Norpace; Awaiting form to fill out and be faxed for approval

## 2013-07-12 NOTE — Telephone Encounter (Signed)
Spoke w/ pt. She reports that she is feeling much better.   Reports that she took her Norpace all weekend, she is not as nauseated, not as weak and not as many arrhythmias. Advised pt to come in for EKG, but she would like to wait until Wednesday. Pt would like to see Dr. Rockey Situ, as well, as she is still not feeling completely well. Pt also reports that she received her 30 day monitor over the weekend, but has not yet applied it.  Pt inquired as to whether she should wait until after her appt on Wed to put it on, but advised pt to go ahead and do so.  She is agreeable to plan and will call w/ further questions or concerns.

## 2013-07-14 ENCOUNTER — Encounter: Payer: Self-pay | Admitting: Cardiovascular Disease

## 2013-07-14 ENCOUNTER — Ambulatory Visit (INDEPENDENT_AMBULATORY_CARE_PROVIDER_SITE_OTHER): Payer: Medicare Other | Admitting: Cardiovascular Disease

## 2013-07-14 VITALS — BP 124/70 | HR 68 | Ht 67.0 in | Wt 197.2 lb

## 2013-07-14 DIAGNOSIS — I4891 Unspecified atrial fibrillation: Secondary | ICD-10-CM

## 2013-07-14 DIAGNOSIS — E785 Hyperlipidemia, unspecified: Secondary | ICD-10-CM

## 2013-07-14 DIAGNOSIS — I509 Heart failure, unspecified: Secondary | ICD-10-CM

## 2013-07-14 DIAGNOSIS — I10 Essential (primary) hypertension: Secondary | ICD-10-CM

## 2013-07-14 DIAGNOSIS — I5032 Chronic diastolic (congestive) heart failure: Secondary | ICD-10-CM

## 2013-07-14 NOTE — Assessment & Plan Note (Signed)
Possible recent episode of atrial fibrillation while changing her medications. Some tightness in her chest likely from mild fluid overload. Suggested she resume her Lasix up to twice a day for symptom relief.

## 2013-07-14 NOTE — Assessment & Plan Note (Signed)
Blood pressure is well controlled on today's visit. No changes made to the medications. 

## 2013-07-14 NOTE — Assessment & Plan Note (Signed)
No recent lipid panel available. Encouraged her to continue on her Lipitor

## 2013-07-14 NOTE — Progress Notes (Signed)
Patient ID: [name], female    DOB: 04/12/1934, 80 y.o.   MRN: [id number]  HPI Comments: [name] is a 80 year old woman with a history of syncope, COPD, as well as symptomatic bradycardia.  She is status post ICD.  history of nonobstructive coronary artery disease with chronic chest pain, bilateral carotid artery stenosis, paroxysmal atrial fibrillation, renal cell carcinoma status post resection of her right kidney with residual chronic renal insufficiency, and COPD.     right hip replacement on May 03 2011.     Chronic dizziness since the summer of 2014 . Symptoms were worse in December 2014, now improved but still mild . Symptoms seem to happen at rest, sometimes with standing, not particularly when doing orthostatic maneuvers .   Echocardiogram was ordered for dizziness and shortness of breath. He showed normal ejection fraction, moderate, possibly moderate to severe mitral valve regurgitation, moderate TR, moderately elevated right ventricular systolic pressures.  BNP was 1500 in early January 2015. She was started on Lasix 40 mg increased up to twice a day dosing. No significant change in her weight. She continues to drink significant amounts of water.  No recent episodes of near syncope or syncope.  On her last clinic visit, she was unable to afford norpace. We changed her to flecainide 50 mg twice a day. She reports that she had tachycardia, palpitations, malaise shortly after. She requested to go back on norpace. Rhythm seem to improve. We did suggest that she come in for EKG that she was unable to at the time.  Today she feels better but still has residual malaise. Feels tight in her chest. Takes her Lasix but not consistently. She has chronic right shoulder pain   stress test in March 2011.    Last carotid u/s  6/10; 40-59% R 60-79%. No neuro symptoms.     Cardiac cath 2006    --non-obstructive CAD 30-40s lesions She reports having repeat catheterization since  that time with no significant progression of her disease  EKG shows AV paced rhythm, rate of 68  beats per minute   Outpatient Encounter Prescriptions as of 07/14/2013  Medication Sig  . amoxicillin (AMOXIL) 875 MG tablet Take 1 tablet (875 mg total) by mouth 2 (two) times daily.  Marland Kitchen aspirin 81 MG EC tablet Take 81 mg by mouth daily after breakfast.   . atorvastatin (LIPITOR) 40 MG tablet Take 1 tablet (40 mg total) by mouth every evening.  . carvedilol (COREG) 12.5 MG tablet Take 1 tablet (12.5 mg total) by mouth 2 (two) times daily with a meal.  . cetirizine (ZYRTEC) 10 MG tablet Take 10 mg by mouth daily.   . disopyramide (NORPACE) 150 MG capsule Take 1 capsule (150 mg total) by mouth 2 (two) times daily.  . furosemide (LASIX) 40 MG tablet Take 40 mg by mouth 2 (two) times daily.  . potassium chloride (K-DUR,KLOR-CON) 10 MEQ tablet Take 1 tablet (10 mEq total) by mouth 2 (two) times daily with breakfast and lunch.  . verapamil (CALAN-SR) 180 MG CR tablet Take 180 mg by mouth at bedtime.   Marland Kitchen warfarin (COUMADIN) 1 MG tablet TAKE AS DIRECTED BY COUMADIN CLINIC  . warfarin (COUMADIN) 2 MG tablet TAKE 1 TABLET BY MOUTH ONCE A DAY  . omeprazole (PRILOSEC) 20 MG capsule Take 1 capsule (20 mg total) by mouth 2 (two) times daily before a meal.    Review of Systems  Constitutional: Negative.   HENT: Negative.   Eyes: Negative.  Respiratory: Positive for shortness of breath.   Gastrointestinal: Negative.   Endocrine: Negative.   Musculoskeletal: Negative.   Skin: Negative.   Allergic/Immunologic: Negative.   Hematological: Negative.   Psychiatric/Behavioral: Negative.   All other systems reviewed and are negative.    BP 124/70  Pulse 68  Ht 5\' 7"  (1.702 m)  Wt 197 lb 4 oz (89.472 kg)  BMI 30.89 kg/m2  Physical Exam  Nursing note and vitals reviewed. Constitutional: She is oriented to person, place, and time. She appears well-developed and well-nourished.  HENT:  Head:  Normocephalic.  Nose: Nose normal.  Mouth/Throat: Oropharynx is clear and moist.  Eyes: Conjunctivae are normal. Pupils are equal, round, and reactive to light.  Neck: Normal range of motion. Neck supple. No JVD present.  Cardiovascular: Normal rate, regular rhythm, S1 normal, S2 normal, normal heart sounds and intact distal pulses.  Exam reveals no gallop and no friction rub.   No murmur heard. Pulmonary/Chest: Effort normal and breath sounds normal. No respiratory distress. She has no wheezes. She has no rales. She exhibits no tenderness.  Skin crackles at the left base  Abdominal: Soft. Bowel sounds are normal. She exhibits no distension. There is no tenderness.  Musculoskeletal: Normal range of motion. She exhibits no edema and no tenderness.  Lymphadenopathy:    She has no cervical adenopathy.  Neurological: She is alert and oriented to person, place, and time. Coordination normal.  Skin: Skin is warm and dry. No rash noted. No erythema.  Psychiatric: She has a normal mood and affect. Her behavior is normal. Judgment and thought content normal.    Assessment and Plan

## 2013-07-14 NOTE — Assessment & Plan Note (Signed)
Possible recent episode of atrial fibrillation though not documented. Recommended she stay on Norpace.

## 2013-07-14 NOTE — Patient Instructions (Signed)
You are doing well. No medication changes were made.  Take extra lasix as needed for chest fullness  Please call us if you have new issues that need to be addressed before your next appt.  Your physician wants you to follow-up in: 6 months.  You will receive a reminder letter in the mail two months in advance. If you don't receive a letter, please call our office to schedule the follow-up appointment.

## 2013-07-21 NOTE — Telephone Encounter (Signed)
Faxed non-formulary drug request form to [facility] for pt's Norpace, as pt cannot tolerate flecainide.

## 2013-07-23 ENCOUNTER — Other Ambulatory Visit: Payer: Self-pay | Admitting: *Deleted

## 2013-07-23 ENCOUNTER — Telehealth: Payer: Self-pay | Admitting: *Deleted

## 2013-07-23 MED ORDER — FUROSEMIDE 40 MG PO TABS: 40.0000 mg | ORAL_TABLET | Freq: Two times a day (BID) | ORAL | Status: DC

## 2013-07-23 NOTE — Telephone Encounter (Signed)
Pt has been approved for Norpace.

## 2013-07-23 NOTE — Telephone Encounter (Signed)
Requested Prescriptions   Signed Prescriptions Disp Refills  . furosemide (LASIX) 40 MG tablet 60 tablet 3    Sig: Take 1 tablet (40 mg total) by mouth 2 (two) times daily.    Authorizing Provider: Minna Merritts    Ordering User: Britt Bottom

## 2013-07-23 NOTE — Telephone Encounter (Signed)
Disopyramide Phosphate 150mg  has been approved 07/21/13 for 1 yr

## 2013-07-28 NOTE — Telephone Encounter (Signed)
Approved for Norpace until July 21, 2014.

## 2013-08-02 ENCOUNTER — Telehealth: Payer: Self-pay | Admitting: *Deleted

## 2013-08-02 ENCOUNTER — Ambulatory Visit: Payer: Medicare Other

## 2013-08-02 NOTE — Telephone Encounter (Signed)
Spoke with pt and she mentioned that she has received monitor but has not hooked up the monitor because she hasn't felt like messing with it at the moment. She did state that she will be wearing the monitor and will try to contact [employer] either today or tomorrow to connect her monitor.

## 2013-08-04 ENCOUNTER — Telehealth: Payer: Self-pay

## 2013-08-04 DIAGNOSIS — R42 Dizziness and giddiness: Secondary | ICD-10-CM

## 2013-08-04 DIAGNOSIS — I4891 Unspecified atrial fibrillation: Secondary | ICD-10-CM

## 2013-08-04 NOTE — Telephone Encounter (Signed)
Pt's daughter called stating that pt received [employer] 30 day event monitor in the mail today.  She called the # to hook it up and was talked thru the process. Once it was hooked up, she states that pt felt dizzy and similar to how she feels when her pacer is checked w/ a magnet.  She was instructed by [employer] to have our office check online for the event.  As of now, there are no reports to be viewed. She was advised to wait to hear back on this regarding whether or not to continue with the monitor or not, as it may be interfering w/ her pacer. Please advise.  Thank you.

## 2013-08-04 NOTE — Telephone Encounter (Signed)
Spoke w/ pt's granddaughter [name].  Advised her that I had spoken w/ Hilda Blades in EP from the [place] office who advised that event monitor will not interfere w/ her pacer. Advised her to have pt try to put the monitor back on and see how she feels.  She states that she is not at the pt's home and will most likely try to reapply monitor tomorrow.  She or the pt is to call the office w/ further questions or concerns.

## 2013-08-05 ENCOUNTER — Ambulatory Visit (INDEPENDENT_AMBULATORY_CARE_PROVIDER_SITE_OTHER): Payer: Medicare Other | Admitting: Family Medicine

## 2013-08-05 DIAGNOSIS — Z5181 Encounter for therapeutic drug level monitoring: Secondary | ICD-10-CM

## 2013-08-05 DIAGNOSIS — Z7901 Long term (current) use of anticoagulants: Secondary | ICD-10-CM

## 2013-08-05 DIAGNOSIS — Z7902 Long term (current) use of antithrombotics/antiplatelets: Secondary | ICD-10-CM

## 2013-08-05 DIAGNOSIS — I4891 Unspecified atrial fibrillation: Secondary | ICD-10-CM

## 2013-08-05 LAB — POCT INR: INR: 2.2

## 2013-08-10 ENCOUNTER — Encounter: Payer: Self-pay | Admitting: *Deleted

## 2013-09-06 ENCOUNTER — Ambulatory Visit (INDEPENDENT_AMBULATORY_CARE_PROVIDER_SITE_OTHER): Payer: Medicare Other | Admitting: Family Medicine

## 2013-09-06 DIAGNOSIS — Z7902 Long term (current) use of antithrombotics/antiplatelets: Secondary | ICD-10-CM

## 2013-09-06 DIAGNOSIS — I4891 Unspecified atrial fibrillation: Secondary | ICD-10-CM

## 2013-09-06 DIAGNOSIS — Z7901 Long term (current) use of anticoagulants: Secondary | ICD-10-CM

## 2013-09-06 DIAGNOSIS — Z5181 Encounter for therapeutic drug level monitoring: Secondary | ICD-10-CM

## 2013-09-06 LAB — POCT INR: INR: 2.3

## 2013-09-07 ENCOUNTER — Other Ambulatory Visit: Payer: Self-pay

## 2013-09-07 ENCOUNTER — Ambulatory Visit (INDEPENDENT_AMBULATORY_CARE_PROVIDER_SITE_OTHER): Payer: Medicare Other

## 2013-09-07 DIAGNOSIS — I4891 Unspecified atrial fibrillation: Secondary | ICD-10-CM

## 2013-09-07 DIAGNOSIS — R42 Dizziness and giddiness: Secondary | ICD-10-CM

## 2013-09-28 ENCOUNTER — Telehealth: Payer: Self-pay | Admitting: Internal Medicine

## 2013-09-28 NOTE — Telephone Encounter (Signed)
Called pt and walked pt on how to hook monitor up. Pt verbalized understanding.

## 2013-09-28 NOTE — Telephone Encounter (Signed)
New problem    Pt needs a call back to set up her remote equipment.  Pt has not done one this year per pt.

## 2013-10-05 ENCOUNTER — Other Ambulatory Visit: Payer: Self-pay | Admitting: Family Medicine

## 2013-10-11 ENCOUNTER — Telehealth: Payer: Self-pay | Admitting: Cardiology

## 2013-10-11 NOTE — Telephone Encounter (Signed)
Called pt on both numbers. LMOVM for pt to return call.

## 2013-10-11 NOTE — Telephone Encounter (Signed)
Message copied by Tiajuana Amass on Mon Oct 11, 2013  3:25 PM ------      Message from: [place], [place]: Mon Oct 11, 2013  1:28 PM       She still hasn't sent a transmission.  Can you call her?  If still having trouble, should just make an appt for followup ------

## 2013-10-12 NOTE — Telephone Encounter (Signed)
Spoke with pt and pt stated that she is going to send transmission today.

## 2013-10-18 ENCOUNTER — Encounter: Payer: Self-pay | Admitting: Internal Medicine

## 2013-10-18 ENCOUNTER — Encounter: Payer: Self-pay | Admitting: *Deleted

## 2013-10-18 ENCOUNTER — Ambulatory Visit (INDEPENDENT_AMBULATORY_CARE_PROVIDER_SITE_OTHER): Payer: Medicare Other | Admitting: *Deleted

## 2013-10-18 DIAGNOSIS — I429 Cardiomyopathy, unspecified: Secondary | ICD-10-CM

## 2013-10-18 DIAGNOSIS — I5042 Chronic combined systolic (congestive) and diastolic (congestive) heart failure: Secondary | ICD-10-CM

## 2013-10-18 LAB — MDC_IDC_ENUM_SESS_TYPE_REMOTE
Battery Voltage: 3.02 V
Brady Statistic AP VP Percent: 99.1 %
Brady Statistic AP VS Percent: 0.1 %
Brady Statistic AS VP Percent: 0.6 %
Brady Statistic AS VS Percent: 0.2 %
HighPow Impedance: 41 Ohm
Lead Channel Impedance Value: 456 Ohm
Lead Channel Impedance Value: 475 Ohm
Lead Channel Pacing Threshold Amplitude: 0.75 V
Lead Channel Pacing Threshold Amplitude: 1.375 V
Lead Channel Pacing Threshold Pulse Width: 0.4 ms
Lead Channel Pacing Threshold Pulse Width: 0.4 ms
Lead Channel Sensing Intrinsic Amplitude: 1.4 mV
Lead Channel Sensing Intrinsic Amplitude: 12.5 mV
Lead Channel Setting Pacing Amplitude: 2.5 V
Lead Channel Setting Pacing Amplitude: 2.5 V
Lead Channel Setting Pacing Pulse Width: 0.4 ms
Lead Channel Setting Sensing Sensitivity: 0.3 mV
Zone Setting Detection Interval: 300 ms
Zone Setting Detection Interval: 350 ms
Zone Setting Detection Interval: 350 ms
Zone Setting Detection Interval: 400 ms

## 2013-10-19 NOTE — Progress Notes (Signed)
Remote ICD transmission.   

## 2013-10-20 ENCOUNTER — Encounter: Payer: Self-pay | Admitting: Cardiovascular Disease

## 2013-10-20 ENCOUNTER — Ambulatory Visit (INDEPENDENT_AMBULATORY_CARE_PROVIDER_SITE_OTHER): Payer: Medicare Other | Admitting: Cardiovascular Disease

## 2013-10-20 VITALS — BP 140/72 | HR 67 | Ht 67.0 in | Wt 195.2 lb

## 2013-10-20 DIAGNOSIS — I6529 Occlusion and stenosis of unspecified carotid artery: Secondary | ICD-10-CM

## 2013-10-20 DIAGNOSIS — R079 Chest pain, unspecified: Secondary | ICD-10-CM

## 2013-10-20 DIAGNOSIS — M79609 Pain in unspecified limb: Secondary | ICD-10-CM

## 2013-10-20 DIAGNOSIS — E785 Hyperlipidemia, unspecified: Secondary | ICD-10-CM

## 2013-10-20 DIAGNOSIS — I509 Heart failure, unspecified: Secondary | ICD-10-CM

## 2013-10-20 DIAGNOSIS — I4891 Unspecified atrial fibrillation: Secondary | ICD-10-CM

## 2013-10-20 DIAGNOSIS — G8929 Other chronic pain: Secondary | ICD-10-CM

## 2013-10-20 DIAGNOSIS — I5032 Chronic diastolic (congestive) heart failure: Secondary | ICD-10-CM

## 2013-10-20 DIAGNOSIS — M79606 Pain in leg, unspecified: Secondary | ICD-10-CM

## 2013-10-20 NOTE — Progress Notes (Signed)
Patient ID: [name], female    DOB: 18-Jan-1934, 80 y.o.   MRN: [id number]  HPI Comments: [name] is a 80 year old woman with a history of syncope, COPD, as well as symptomatic bradycardia.  She is status post ICD.  history of nonobstructive coronary artery disease with chronic chest pain, bilateral carotid artery stenosis 60-70% disease on the left, paroxysmal atrial fibrillation, renal cell carcinoma status post resection of her right kidney with residual chronic renal insufficiency, and COPD.   She has chronic leg pain  right hip replacement on May 03 2011.    Chronic dizziness since the summer of 2014 . Symptoms were worse in December 2014  Echocardiogram showed normal ejection fraction, moderate, possibly moderate to severe mitral valve regurgitation, moderate TR, moderately elevated right ventricular systolic pressures.  BNP was 1500 in early January 2015. She was started on Lasix 40 mg increased up to twice a day dosing.  She continues to drink significant amounts of water.   In followup today, she reports having severe lower extremity leg pain. This has been ongoing for several years, worse recently. She is unable to walk for a far secondary to severe pain particularly below her knees bilaterally. Prior ABI/lower extremity Doppler may 2013 did not show significant PAD. She has tried to hold her Lipitor with no improvement of her leg pain. No recent episodes of near syncope or syncope.  On her last clinic visit, she was unable to afford norpace. We changed her to flecainide 50 mg twice a day. She reports that she had tachycardia, palpitations, malaise shortly after. She requested to go back on norpace. Rhythm seem to improve.  Takes her Lasix but not consistently. She has chronic right shoulder pain   stress test in March 2011.    Last carotid u/s  6/10; 40-59% R 60-79%. No neuro symptoms.     Cardiac cath 2006    --non-obstructive CAD 30-40s lesions She reports  having repeat catheterization since that time with no significant progression of her disease  EKG shows AV paced rhythm, rate of 67  beats per minute   Outpatient Encounter Prescriptions as of 10/20/2013  Medication Sig  . aspirin 81 MG EC tablet Take 81 mg by mouth daily after breakfast.   . atorvastatin (LIPITOR) 40 MG tablet Take 1 tablet (40 mg total) by mouth every evening.  . carvedilol (COREG) 12.5 MG tablet Take 1 tablet (12.5 mg total) by mouth 2 (two) times daily with a meal.  . cetirizine (ZYRTEC) 10 MG tablet Take 10 mg by mouth daily.   . disopyramide (NORPACE) 150 MG capsule Take 1 capsule (150 mg total) by mouth 2 (two) times daily.  . furosemide (LASIX) 40 MG tablet Take 1 tablet (40 mg total) by mouth 2 (two) times daily.  Marland Kitchen omeprazole (PRILOSEC) 20 MG capsule Take 1 capsule (20 mg total) by mouth 2 (two) times daily before a meal.  . potassium chloride (K-DUR,KLOR-CON) 10 MEQ tablet Take 1 tablet (10 mEq total) by mouth 2 (two) times daily with breakfast and lunch.  . verapamil (CALAN-SR) 180 MG CR tablet Take 180 mg by mouth at bedtime.   Marland Kitchen warfarin (COUMADIN) 1 MG tablet TAKE AS DIRECTED BY COUMADIN CLINIC  . warfarin (COUMADIN) 2 MG tablet TAKE 1 TABLET BY MOUTH ONCE A DAY   Review of Systems  Constitutional: Negative.   HENT: Negative.   Eyes: Negative.   Respiratory: Positive for shortness of breath.   Cardiovascular: Negative.   Gastrointestinal:  Negative.   Endocrine: Negative.   Musculoskeletal: Negative.   Skin: Negative.   Allergic/Immunologic: Negative.   Neurological: Negative.   Hematological: Negative.   Psychiatric/Behavioral: Negative.   All other systems reviewed and are negative.   BP 140/72  Pulse 67  Ht 5\' 7"  (1.702 m)  Wt 195 lb 4 oz (88.565 kg)  BMI 30.57 kg/m2  Physical Exam  Nursing note and vitals reviewed. Constitutional: She is oriented to person, place, and time. She appears well-developed and well-nourished.  HENT:  Head:  Normocephalic.  Nose: Nose normal.  Mouth/Throat: Oropharynx is clear and moist.  Eyes: Conjunctivae are normal. Pupils are equal, round, and reactive to light.  Neck: Normal range of motion. Neck supple. No JVD present.  Cardiovascular: Normal rate, regular rhythm, S1 normal, S2 normal, normal heart sounds and intact distal pulses.  Exam reveals no gallop and no friction rub.   No murmur heard. Pulmonary/Chest: Effort normal and breath sounds normal. No respiratory distress. She has no wheezes. She has no rales. She exhibits no tenderness.  Skin crackles at the left base  Abdominal: Soft. Bowel sounds are normal. She exhibits no distension. There is no tenderness.  Musculoskeletal: Normal range of motion. She exhibits no edema and no tenderness.  Lymphadenopathy:    She has no cervical adenopathy.  Neurological: She is alert and oriented to person, place, and time. Coordination normal.  Skin: Skin is warm and dry. No rash noted. No erythema.  Psychiatric: She has a normal mood and affect. Her behavior is normal. Judgment and thought content normal.    Assessment and Plan

## 2013-10-20 NOTE — Assessment & Plan Note (Signed)
Recent lipid panel not available. This can be done through our office but she reports that she will have followup with primary care

## 2013-10-20 NOTE — Assessment & Plan Note (Signed)
Etiology of her worsening leg pain, particularly of the knees bilaterally, is unclear. Prior lower the ultrasound did not suggest claudication/ significant PAD. She reports that she has held her Lipitor in the past and this did not help her muscle ache. Recommended she try coenzyme Q 10, even short trial of NSAIDs. Unable to exclude a rheumatologic type issue. Also recommended she try alternate exercise therapy such as biking or pool therapy. She could try massage as well. Uncertain if she might benefit from Cymbalta or lirica

## 2013-10-20 NOTE — Assessment & Plan Note (Signed)
Tolerating Norpace, warfarin. Denies having any episodes of tachycardia, rare

## 2013-10-20 NOTE — Assessment & Plan Note (Signed)
She appears to be relatively euvolemic. Encouraged her to stay on Lasix 40 mg twice a day

## 2013-10-20 NOTE — Assessment & Plan Note (Signed)
Repeat carotid ultrasound later in 2015. Continue aggressive cholesterol management

## 2013-10-20 NOTE — Patient Instructions (Signed)
You are doing well. No medication changes were made.  Try CoEnz Q10 for muscle ache. Try biking and swimming  Please call us if you have new issues that need to be addressed before your next appt.  Your physician wants you to follow-up in: 6 months.  You will receive a reminder letter in the mail two months in advance. If you don't receive a letter, please call our office to schedule the follow-up appointment.

## 2013-10-21 ENCOUNTER — Ambulatory Visit: Payer: Medicare Other

## 2013-10-25 ENCOUNTER — Other Ambulatory Visit: Payer: Self-pay | Admitting: Internal Medicine

## 2013-11-09 ENCOUNTER — Encounter: Payer: Self-pay | Admitting: Cardiology

## 2013-11-15 ENCOUNTER — Encounter: Payer: Self-pay | Admitting: Internal Medicine

## 2013-11-19 ENCOUNTER — Ambulatory Visit: Payer: Self-pay | Admitting: Family Medicine

## 2013-11-19 ENCOUNTER — Encounter: Payer: Self-pay | Admitting: Family Medicine

## 2013-11-22 ENCOUNTER — Other Ambulatory Visit: Payer: Self-pay | Admitting: *Deleted

## 2013-11-22 ENCOUNTER — Telehealth: Payer: Self-pay | Admitting: Family Medicine

## 2013-11-22 DIAGNOSIS — L299 Pruritus, unspecified: Secondary | ICD-10-CM

## 2013-11-22 MED ORDER — FUROSEMIDE 40 MG PO TABS: 40.0000 mg | ORAL_TABLET | Freq: Two times a day (BID) | ORAL | Status: DC

## 2013-11-22 NOTE — Telephone Encounter (Signed)
Patient made her own appt for a MMG at [facility] for a screening. She complained of left breast nipple itching so instead they did a diagnostic bilateral mmg and a left breast US and they need you to put in these two orders so we can fax them over. She was seen there on 11/17/13.

## 2013-11-22 NOTE — Telephone Encounter (Signed)
Thank you.  Both orders entered.

## 2013-11-22 NOTE — Telephone Encounter (Signed)
Requested Prescriptions   Signed Prescriptions Disp Refills  . furosemide (LASIX) 40 MG tablet 60 tablet 3    Sig: Take 1 tablet (40 mg total) by mouth 2 (two) times daily.    Authorizing Provider: Minna Merritts    Ordering User: Britt Bottom

## 2013-12-07 ENCOUNTER — Ambulatory Visit (INDEPENDENT_AMBULATORY_CARE_PROVIDER_SITE_OTHER): Payer: Medicare Other | Admitting: Family Medicine

## 2013-12-07 ENCOUNTER — Encounter: Payer: Self-pay | Admitting: Family Medicine

## 2013-12-07 VITALS — BP 138/78 | HR 68 | Temp 98.3°F | Wt 198.0 lb

## 2013-12-07 DIAGNOSIS — J069 Acute upper respiratory infection, unspecified: Secondary | ICD-10-CM

## 2013-12-07 DIAGNOSIS — Z905 Acquired absence of kidney: Secondary | ICD-10-CM

## 2013-12-07 MED ORDER — AMOXICILLIN-POT CLAVULANATE 875-125 MG PO TABS: 1.0000 | ORAL_TABLET | Freq: Two times a day (BID) | ORAL | Status: DC

## 2013-12-07 NOTE — Progress Notes (Signed)
Pre visit review using our clinic review tool, if applicable. No additional management support is needed unless otherwise documented below in the visit note. 

## 2013-12-07 NOTE — Patient Instructions (Signed)
Great to see you. Please take Augmentin- 1 tablet twice daily for 10 days. Come in for you coumadin check next week. Continue zyrtec. Restart flonase.

## 2013-12-07 NOTE — Progress Notes (Signed)
SUBJECTIVE:  [name] is a 80 y.o. female who complains of coryza, congestion, sneezing, sore throat, dry cough and bilateral sinus pain for 8 days.  She is taking OTC zyrtec.  She denies a history of anorexia, chest pain and chills and denies a history of asthma. Patient denies smoke cigarettes.   Current Outpatient Prescriptions on File Prior to Visit  Medication Sig Dispense Refill  . aspirin 81 MG EC tablet Take 81 mg by mouth daily after breakfast.       . atorvastatin (LIPITOR) 40 MG tablet Take 1 tablet (40 mg total) by mouth every evening.  30 tablet  6  . carvedilol (COREG) 12.5 MG tablet Take 1 tablet (12.5 mg total) by mouth 2 (two) times daily with a meal.  60 tablet  5  . cetirizine (ZYRTEC) 10 MG tablet Take 10 mg by mouth daily.       . disopyramide (NORPACE) 150 MG capsule Take 1 capsule (150 mg total) by mouth 2 (two) times daily.  60 capsule  6  . furosemide (LASIX) 40 MG tablet Take 1 tablet (40 mg total) by mouth 2 (two) times daily.  60 tablet  3  . omeprazole (PRILOSEC) 20 MG capsule Take 1 capsule (20 mg total) by mouth 2 (two) times daily before a meal.  60 capsule  6  . potassium chloride (K-DUR,KLOR-CON) 10 MEQ tablet Take 1 tablet (10 mEq total) by mouth 2 (two) times daily with breakfast and lunch.  60 tablet  6  . verapamil (CALAN-SR) 180 MG CR tablet Take 180 mg by mouth at bedtime.       Marland Kitchen warfarin (COUMADIN) 1 MG tablet TAKE AS DIRECTED BY COUMADIN CLINIC  30 tablet  2  . warfarin (COUMADIN) 2 MG tablet TAKE 1 TABLET BY MOUTH ONCE A DAY  30 tablet  2  . [DISCONTINUED] hydrochlorothiazide (HYDRODIURIL) 25 MG tablet Take 25 mg by mouth daily after breakfast.        No current facility-administered medications on file prior to visit.    Allergies  Allergen Reactions  . Codeine Nausea And Vomiting  . Morphine And Related Nausea And Vomiting  . Sulfonamide Derivatives     REACTION: Dry mouth    Past Medical History  Diagnosis Date  . Carotid artery  stenosis 09/2007    60-79% bilateral (stable)  . Carotid artery stenosis 10/2008    40-59% R 60-79%   . Chest pain, unspecified   . Atrial fibrillation   . Sinus bradycardia   . Hypotension, unspecified     cardiac cath 2006..nonobstructive CAD 30-40s lesions.Marland KitchenETT 1/09 nondiagnostic due to poor HR response..Right Renal Cancer 2003  . Blood in stool   . Secondary cardiomyopathy, unspecified   . Chronic airway obstruction, not elsewhere classified   . Long term (current) use of anticoagulants   . Dysuria   . Edema   . Encounter for therapeutic drug monitoring   . Other malaise and fatigue   . Hypertrophic obstructive cardiomyopathy   . Urge incontinence   . Other acute sinusitis   . Other and unspecified coagulation defects   . Other screening mammogram   . Pre-operative cardiovascular examination   . Hemorrhage of rectum and anus   . Chronic kidney disease, unspecified   . Special screening for osteoporosis   . Hypertension 05/20/2011  . PONV (postoperative nausea and vomiting)   . Adjustment disorder with anxiety   . Coronary artery disease     non-obstructive by 2006  cath  . Automatic implantable cardioverter-defibrillator in situ   . High cholesterol   . Anginal pain   . Chronic bronchitis     "get it some; not q year" (11/11/2012)  . Exertional shortness of breath   . History of blood transfusion 04/2011    "after hip OR" (11/11/2012)  . Arthritis     "some in my hands" (11/11/2012)  . Osteoarthritis of right hip   . Renal cancer 06/2001    Right  . Malignant neoplasm of kidney, except pelvis   . Frequent UTI     "get them a couple times/yr" (11/11/2012)  . Chronic diastolic CHF (congestive heart failure) 06/04/2013    Past Surgical History  Procedure Laterality Date  . Abdominal hysterectomy  1975    for benign causes  . Cardiac catheterization  2006  . Nephrectomy Right 06/2001     S/P RENAL CELL CANCER  . Cataract extraction w/ intraocular lens  implant, bilateral   01/2006-02-2006  . Insert / replace / remove pacemaker  05-01-11    02-28-05-/05-04-10-ICD-MEDTRONIC MAXIMAL DR  . Appendectomy    . Total hip arthroplasty Right 05/03/2011    Procedure: TOTAL HIP ARTHROPLASTY ANTERIOR APPROACH;  Surgeon: Mcarthur Rossetti;  Location: [facility];  Service: Orthopedics;  Laterality: Right;  Removal of Cannulated Screws Right Hip, Right Direct Anterior Hip Replacement  . Laparoscopic cholecystectomy  11/11/2012  . Bi-ventricular pacemaker upgrade  05/04/2010  . Joint replacement    . Cholecystectomy N/A 11/11/2012    Procedure: LAPAROSCOPIC CHOLECYSTECTOMY WITH INTRAOPERATIVE CHOLANGIOGRAM;  Surgeon: Imogene Burn. Georgette Dover, MD;  Location: [facility];  Service: General;  Laterality: N/A;  . Laparoscopic lysis of adhesions N/A 11/11/2012    Procedure: LAPAROSCOPIC LYSIS OF ADHESIONS;  Surgeon: Imogene Burn. Georgette Dover, MD;  Location: [facility];  Service: General;  Laterality: N/A;    Family History  Problem Relation Age of Onset  . Colon cancer Neg Hx   . Heart failure Mother     History   Social History  . Marital Status: Widowed    Spouse Name: N/A    Number of Children: N/A  . Years of Education: N/A   Occupational History  . Retired    Social History Main Topics  . Smoking status: Former Smoker -- 0.50 packs/day for 40 years    Types: Cigarettes    Quit date: 05/06/2001  . Smokeless tobacco: Never Used  . Alcohol Use: No  . Drug Use: No  . Sexual Activity: No   Other Topics Concern  . 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   ALCOHOL USE-NO   RETIRED   FORMER TOBACCO USE.....1/2 PACK X 63 YRS UNTIL 2003   The PMH, PSH, Social History, Family History, Medications, and allergies have been reviewed in [facility], and have been updated if relevant.  OBJECTIVE: BP 138/78  Pulse 68  Temp(Src) 98.3 F (36.8  C) (Oral)  Wt 198 lb (89.812 kg)  SpO2 95%  She appears well, vital signs are as noted. Ears normal.  Throat and pharynx normal.  Neck supple. No adenopathy in the neck. Nose is congested. Sinuses tender. The chest is clear, without wheezes or rales.  ASSESSMENT:  sinusitis  PLAN: Given duration and progression of symptoms, will treat for bacterial sinusitis. Multiple drug allergies treat  with Augmentin.  She will schedule INR check next week.  Continue zyrtec.  Restart flonase.  Symptomatic therapy suggested: push fluids, rest and return office visit prn if symptoms persist or worsen.Call or return to clinic prn if these symptoms worsen or fail to improve as anticipated.

## 2013-12-08 ENCOUNTER — Other Ambulatory Visit: Payer: Self-pay | Admitting: Family Medicine

## 2013-12-08 DIAGNOSIS — N184 Chronic kidney disease, stage 4 (severe): Secondary | ICD-10-CM | POA: Insufficient documentation

## 2013-12-08 DIAGNOSIS — Z905 Acquired absence of kidney: Secondary | ICD-10-CM

## 2013-12-08 DIAGNOSIS — C649 Malignant neoplasm of unspecified kidney, except renal pelvis: Secondary | ICD-10-CM

## 2013-12-08 LAB — COMPREHENSIVE METABOLIC PANEL
ALT: 13 U/L (ref 0–35)
AST: 22 U/L (ref 0–37)
Albumin: 4.1 g/dL (ref 3.5–5.2)
Alkaline Phosphatase: 55 U/L (ref 39–117)
BUN: 18 mg/dL (ref 6–23)
CO2: 31 mEq/L (ref 19–32)
Calcium: 9.2 mg/dL (ref 8.4–10.5)
Chloride: 100 mEq/L (ref 96–112)
Creatinine, Ser: 1.7 mg/dL — ABNORMAL HIGH (ref 0.4–1.2)
GFR: 32 mL/min — ABNORMAL LOW (ref >60.00)
Glucose, Bld: 68 mg/dL — ABNORMAL LOW (ref 70–99)
Potassium: 3.8 mEq/L (ref 3.5–5.1)
Sodium: 138 mEq/L (ref 135–145)
Total Bilirubin: 0.6 mg/dL (ref 0.2–1.2)
Total Protein: 6.9 g/dL (ref 6.0–8.3)

## 2013-12-10 ENCOUNTER — Other Ambulatory Visit: Payer: Self-pay

## 2013-12-10 ENCOUNTER — Other Ambulatory Visit: Payer: Self-pay | Admitting: *Deleted

## 2013-12-10 MED ORDER — CARVEDILOL 12.5 MG PO TABS: 12.5000 mg | ORAL_TABLET | Freq: Two times a day (BID) | ORAL | Status: DC

## 2013-12-10 NOTE — Telephone Encounter (Signed)
Refill sent for carvedilol 12.5 mg  

## 2013-12-13 ENCOUNTER — Ambulatory Visit: Payer: Medicare Other

## 2013-12-16 ENCOUNTER — Ambulatory Visit (INDEPENDENT_AMBULATORY_CARE_PROVIDER_SITE_OTHER): Payer: Medicare Other | Admitting: Family Medicine

## 2013-12-16 DIAGNOSIS — I4891 Unspecified atrial fibrillation: Secondary | ICD-10-CM

## 2013-12-16 DIAGNOSIS — Z5181 Encounter for therapeutic drug level monitoring: Secondary | ICD-10-CM

## 2013-12-16 DIAGNOSIS — Z7901 Long term (current) use of anticoagulants: Secondary | ICD-10-CM

## 2013-12-16 DIAGNOSIS — Z7902 Long term (current) use of antithrombotics/antiplatelets: Secondary | ICD-10-CM

## 2013-12-16 LAB — POCT INR: INR: 2

## 2013-12-20 ENCOUNTER — Telehealth: Payer: Self-pay | Admitting: Family Medicine

## 2013-12-20 NOTE — Telephone Encounter (Signed)
Pt called checking on her referral to kidney dr. She would like to go to [name] Pt stated she was having a lot of lower back pain pt stated she was concerned because she only has 1 kidney

## 2013-12-31 ENCOUNTER — Other Ambulatory Visit: Payer: Self-pay | Admitting: Family Medicine

## 2014-01-14 ENCOUNTER — Ambulatory Visit (INDEPENDENT_AMBULATORY_CARE_PROVIDER_SITE_OTHER)
Admission: RE | Admit: 2014-01-14 | Discharge: 2014-01-14 | Disposition: A | Discharge status: Home / Self Care | Payer: Medicare Other | Source: Ambulatory Visit | Attending: Family Medicine | Admitting: Family Medicine

## 2014-01-14 ENCOUNTER — Encounter: Payer: Self-pay | Admitting: Family Medicine

## 2014-01-14 ENCOUNTER — Ambulatory Visit (INDEPENDENT_AMBULATORY_CARE_PROVIDER_SITE_OTHER): Payer: Medicare Other | Admitting: Family Medicine

## 2014-01-14 VITALS — BP 136/82 | HR 76 | Temp 97.6°F | Wt 196.2 lb

## 2014-01-14 DIAGNOSIS — R531 Weakness: Secondary | ICD-10-CM | POA: Insufficient documentation

## 2014-01-14 DIAGNOSIS — R5381 Other malaise: Secondary | ICD-10-CM

## 2014-01-14 DIAGNOSIS — R5383 Other fatigue: Secondary | ICD-10-CM

## 2014-01-14 DIAGNOSIS — Z23 Encounter for immunization: Secondary | ICD-10-CM

## 2014-01-14 DIAGNOSIS — R5382 Chronic fatigue, unspecified: Secondary | ICD-10-CM | POA: Insufficient documentation

## 2014-01-14 DIAGNOSIS — R300 Dysuria: Secondary | ICD-10-CM

## 2014-01-14 LAB — CBC WITH DIFFERENTIAL/PLATELET
Basophils Absolute: 0 10*3/uL (ref 0.0–0.1)
Basophils Relative: 0.4 % (ref 0.0–3.0)
Eosinophils Absolute: 0.1 10*3/uL (ref 0.0–0.7)
Eosinophils Relative: 1 % (ref 0.0–5.0)
HCT: 34.9 % — ABNORMAL LOW (ref 36.0–46.0)
Hemoglobin: 11.5 g/dL — ABNORMAL LOW (ref 12.0–15.0)
Lymphocytes Relative: 16.4 % (ref 12.0–46.0)
Lymphs Abs: 1.2 10*3/uL (ref 0.7–4.0)
MCHC: 32.9 g/dL (ref 30.0–36.0)
MCV: 84.4 fl (ref 78.0–100.0)
Monocytes Absolute: 1.1 10*3/uL — ABNORMAL HIGH (ref 0.1–1.0)
Monocytes Relative: 14.9 % — ABNORMAL HIGH (ref 3.0–12.0)
Neutro Abs: 4.8 10*3/uL (ref 1.4–7.7)
Neutrophils Relative %: 67.3 % (ref 43.0–77.0)
Platelets: 168 10*3/uL (ref 150.0–400.0)
RBC: 4.14 Mil/uL (ref 3.87–5.11)
RDW: 17.6 % — ABNORMAL HIGH (ref 11.5–15.5)
WBC: 7.2 10*3/uL (ref 4.0–10.5)

## 2014-01-14 LAB — COMPREHENSIVE METABOLIC PANEL
ALT: 13 U/L (ref 0–35)
AST: 19 U/L (ref 0–37)
Albumin: 3.9 g/dL (ref 3.5–5.2)
Alkaline Phosphatase: 62 U/L (ref 39–117)
BUN: 18 mg/dL (ref 6–23)
CO2: 32 mEq/L (ref 19–32)
Calcium: 9.1 mg/dL (ref 8.4–10.5)
Chloride: 98 mEq/L (ref 96–112)
Creatinine, Ser: 1.7 mg/dL — ABNORMAL HIGH (ref 0.4–1.2)
GFR: 31.12 mL/min — ABNORMAL LOW (ref >60.00)
Glucose, Bld: 68 mg/dL — ABNORMAL LOW (ref 70–99)
Potassium: 3.5 mEq/L (ref 3.5–5.1)
Sodium: 136 mEq/L (ref 135–145)
Total Bilirubin: 0.7 mg/dL (ref 0.2–1.2)
Total Protein: 7.2 g/dL (ref 6.0–8.3)

## 2014-01-14 LAB — POCT URINALYSIS DIPSTICK
Bilirubin, UA: NEGATIVE
Blood, UA: NEGATIVE
Glucose, UA: NEGATIVE
Ketones, UA: NEGATIVE
Leukocytes, UA: NEGATIVE
Nitrite, UA: NEGATIVE
Protein, UA: NEGATIVE
Spec Grav, UA: 1.02
Urobilinogen, UA: 0.2
pH, UA: 6

## 2014-01-14 LAB — VITAMIN B12: Vitamin B-12: 175 pg/mL — ABNORMAL LOW (ref 211–911)

## 2014-01-14 LAB — TSH: TSH: 1.61 u[IU]/mL (ref 0.35–4.50)

## 2014-01-14 LAB — HEMOGLOBIN A1C: Hgb A1c MFr Bld: 5.6 % (ref 4.6–6.5)

## 2014-01-14 MED ORDER — AZITHROMYCIN 250 MG PO TABS: ORAL_TABLET | ORAL | Status: DC

## 2014-01-14 NOTE — Progress Notes (Signed)
   Subjective:   Patient ID: [name], female    DOB: [birth year], 80 y.o.   MRN: [id number]  [name] is a pleasant 80 y.o. year old female who presents to clinic today with Fatigue and Dysuria  on 01/14/2014  HPI: [name] is a 80 year old woman with a history of syncope, COPD, CAD,carotid stenosis, afib on coumadin, CDK, as well as symptomatic bradycardia status post ICD here for two weeks of "extreme weakness."  No changes to current rx.  Has noticed that even if she sleeps well, she is very tired and fatigued.  Has had DOE for years, feels this is a little worse.  Chronic intermittent CP- followed by Dr. Rockey Situ and Dr. Caryl Comes. Sees Dr. Caryl Comes again for her pacer on 02/15/2014. Last saw Dr. Rockey Situ on 10/20/13 for CP- note reviewed.  Lab Results  Component Value Date   WBC 7.5 11/12/2012   HGB 10.7* 11/12/2012   HCT 33.4* 11/12/2012   MCV 89.8 11/12/2012   PLT 165 11/12/2012   Lab Results  Component Value Date   TSH 1.890 02/10/2012    Review of Systems   See HPI No blood in stool No abdominal pain No n/v/d No LE edema No blurred vision No syncope No HA Objective:    BP 136/82  Pulse 76  Temp(Src) 97.6 F (36.4 C) (Oral)  Wt 196 lb 4 oz (89.018 kg)  SpO2 97%   Physical Exam  Nursing note and vitals reviewed. Constitutional: She is oriented to person, place, and time. She appears well-developed and well-nourished. No distress.  HENT:  Head: Normocephalic and atraumatic.  Cardiovascular: Normal rate, regular rhythm and normal heart sounds.   Pulmonary/Chest: Effort normal and breath sounds normal. No respiratory distress.  Musculoskeletal: She exhibits no edema.  Neurological: She is alert and oriented to person, place, and time. No cranial nerve deficit.  Skin: Skin is warm and dry.  Psychiatric: She has a normal mood and affect. Her behavior is normal. Judgment and thought content normal.          Assessment & Plan:   Weakness - Plan:  CBC with Differential, Comprehensive metabolic panel, TSH, Vitamin B12, Hemoglobin A1c, DG Chest 2 View  Dysuria - Plan: Urinalysis Dipstick  Need for prophylactic vaccination and inoculation against influenza - Plan: Flu Vaccine QUAD 36+ mos PF IM (Fluarix Quad PF) No Follow-up on file.

## 2014-01-14 NOTE — Progress Notes (Signed)
Pre visit review using our clinic review tool, if applicable. No additional management support is needed unless otherwise documented below in the visit note. 

## 2014-01-14 NOTE — Assessment & Plan Note (Signed)
Deteriorated. >25 minutes spent in face to face time with patient, >50% spent in counselling or coordination of care She has multiple chronic issues that could be contributing to her current symptoms but we certainly need to work this up further given acutely worsening symptoms. Check labs and CXR today. The patient indicates understanding of these issues and agrees with the plan. Orders Placed This Encounter  Procedures  . DG Chest 2 View  . Flu Vaccine QUAD 36+ mos PF IM (Fluarix Quad PF)  . CBC with Differential  . Comprehensive metabolic panel  . TSH  . Vitamin B12  . Hemoglobin A1c  . Urinalysis Dipstick

## 2014-01-14 NOTE — Addendum Note (Signed)
Addended by: Lucille Passy on: 01/14/2014 02:22 PM   Modules accepted: Orders

## 2014-01-14 NOTE — Patient Instructions (Signed)
Good to see you. I will call you with your xray and blood work results.

## 2014-01-20 ENCOUNTER — Telehealth: Payer: Self-pay

## 2014-01-20 NOTE — Telephone Encounter (Signed)
I'm sorry.  I must have thought we discussed it when I called her about her pneumonia.  See result note.

## 2014-01-20 NOTE — Telephone Encounter (Signed)
Pt left v/m requesting cb about lab results done 01/14/14.Please advise.

## 2014-01-20 NOTE — Telephone Encounter (Signed)
It looks like I cannot result it for some reason: B12 very low- should get monthly B12 shots, kidney function stable and hemoglobin improved.

## 2014-01-21 NOTE — Telephone Encounter (Signed)
Attempted to contact pt, unable to leave vm

## 2014-01-24 NOTE — Telephone Encounter (Signed)
Spoke to pt and informed her of results. Pt states that she prefers to have monthly injections versus OTC tabs. Med list updated and injection scheduled.

## 2014-01-27 ENCOUNTER — Ambulatory Visit (INDEPENDENT_AMBULATORY_CARE_PROVIDER_SITE_OTHER): Payer: Medicare Other | Admitting: Family Medicine

## 2014-01-27 DIAGNOSIS — E538 Deficiency of other specified B group vitamins: Secondary | ICD-10-CM

## 2014-01-27 DIAGNOSIS — I4891 Unspecified atrial fibrillation: Secondary | ICD-10-CM

## 2014-01-27 DIAGNOSIS — Z7901 Long term (current) use of anticoagulants: Secondary | ICD-10-CM

## 2014-01-27 DIAGNOSIS — Z7902 Long term (current) use of antithrombotics/antiplatelets: Secondary | ICD-10-CM

## 2014-01-27 DIAGNOSIS — Z5181 Encounter for therapeutic drug level monitoring: Secondary | ICD-10-CM

## 2014-01-27 LAB — POCT INR: INR: 2

## 2014-01-27 MED ORDER — CYANOCOBALAMIN 1000 MCG/ML IJ SOLN
1000.0000 ug | Freq: Once | INTRAMUSCULAR | Status: AC
  Administered 2014-01-27: 1000 ug via INTRAMUSCULAR

## 2014-02-02 ENCOUNTER — Other Ambulatory Visit: Payer: Self-pay | Admitting: *Deleted

## 2014-02-02 MED ORDER — DISOPYRAMIDE PHOSPHATE 150 MG PO CAPS: 150.0000 mg | ORAL_CAPSULE | Freq: Two times a day (BID) | ORAL | Status: DC

## 2014-02-15 ENCOUNTER — Ambulatory Visit (INDEPENDENT_AMBULATORY_CARE_PROVIDER_SITE_OTHER): Payer: Medicare Other | Admitting: Internal Medicine

## 2014-02-15 ENCOUNTER — Encounter: Payer: Self-pay | Admitting: Internal Medicine

## 2014-02-15 VITALS — BP 136/80 | HR 68 | Ht 67.0 in | Wt 196.8 lb

## 2014-02-15 DIAGNOSIS — N183 Chronic kidney disease, stage 3 unspecified: Secondary | ICD-10-CM

## 2014-02-15 DIAGNOSIS — I495 Sick sinus syndrome: Secondary | ICD-10-CM

## 2014-02-15 DIAGNOSIS — R079 Chest pain, unspecified: Secondary | ICD-10-CM

## 2014-02-15 DIAGNOSIS — R0602 Shortness of breath: Secondary | ICD-10-CM

## 2014-02-15 DIAGNOSIS — I5032 Chronic diastolic (congestive) heart failure: Secondary | ICD-10-CM

## 2014-02-15 DIAGNOSIS — I480 Paroxysmal atrial fibrillation: Secondary | ICD-10-CM

## 2014-02-15 NOTE — Progress Notes (Signed)
Patient Care Team: Lucille Passy, MD as PCP - General   HPI  [name] is a 80 y.o. female Seen in followup for nonischemic cardiomyopathy for which she has a primary prevention ICD implanted. She has paroxysmal atrial fibrillation; previously this was treated with disopyramide but was switched earlier this year to flecainide. It was however complicated by side effects and she went back on disopyramide.  Echocardiogram 2015 demonstrated normal LV function moderate-severe MR moderate TR.  She has a complicated medical history in addition which includes renal cell carcinoma with prior resection, chronic renal insufficiency and COPD.  The patient denies chest pain, shortness of breath, nocturnal dyspnea, orthopnea or peripheral edema.  There have been no palpitations, lightheadedness or syncope.    Past Medical History  Diagnosis Date  . Carotid artery stenosis 09/2007    60-79% bilateral (stable)  . Carotid artery stenosis 10/2008    40-59% R 60-79%   . Chest pain, unspecified   . Atrial fibrillation   . Sinus bradycardia   . Hypotension, unspecified     cardiac cath 2006..nonobstructive CAD 30-40s lesions.Marland KitchenETT 1/09 nondiagnostic due to poor HR response..Right Renal Cancer 2003  . Blood in stool   . Secondary cardiomyopathy, unspecified   . Chronic airway obstruction, not elsewhere classified   . Long term (current) use of anticoagulants   . Dysuria   . Edema   . Encounter for therapeutic drug monitoring   . Other malaise and fatigue   . Hypertrophic obstructive cardiomyopathy   . Urge incontinence   . Other acute sinusitis   . Other and unspecified coagulation defects   . Other screening mammogram   . Pre-operative cardiovascular examination   . Hemorrhage of rectum and anus   . Chronic kidney disease, unspecified   . Special screening for osteoporosis   . Hypertension 05/20/2011  . PONV (postoperative nausea and vomiting)   . Adjustment disorder with  anxiety   . Coronary artery disease     non-obstructive by 2006 cath  . Automatic implantable cardioverter-defibrillator in situ   . High cholesterol   . Anginal pain   . Chronic bronchitis     "get it some; not q year" (11/11/2012)  . Exertional shortness of breath   . History of blood transfusion 04/2011    "after hip OR" (11/11/2012)  . Arthritis     "some in my hands" (11/11/2012)  . Osteoarthritis of right hip   . Renal cancer 06/2001    Right  . Malignant neoplasm of kidney, except pelvis   . Frequent UTI     "get them a couple times/yr" (11/11/2012)  . Chronic diastolic CHF (congestive heart failure) 06/04/2013    Past Surgical History  Procedure Laterality Date  . Abdominal hysterectomy  1975    for benign causes  . Cardiac catheterization  2006  . Nephrectomy Right 06/2001     S/P RENAL CELL CANCER  . Cataract extraction w/ intraocular lens  implant, bilateral  01/2006-02-2006  . Insert / replace / remove pacemaker  05-01-11    02-28-05-/05-04-10-ICD-MEDTRONIC MAXIMAL DR  . Appendectomy    . Total hip arthroplasty Right 05/03/2011    Procedure: TOTAL HIP ARTHROPLASTY ANTERIOR APPROACH;  Surgeon: Mcarthur Rossetti;  Location: [facility];  Service: Orthopedics;  Laterality: Right;  Removal of Cannulated Screws Right Hip, Right Direct Anterior Hip Replacement  . Laparoscopic cholecystectomy  11/11/2012  . Bi-ventricular pacemaker upgrade  05/04/2010  . Joint replacement    .  Cholecystectomy N/A 11/11/2012    Procedure: LAPAROSCOPIC CHOLECYSTECTOMY WITH INTRAOPERATIVE CHOLANGIOGRAM;  Surgeon: Imogene Burn. Georgette Dover, MD;  Location: [facility];  Service: General;  Laterality: N/A;  . Laparoscopic lysis of adhesions N/A 11/11/2012    Procedure: LAPAROSCOPIC LYSIS OF ADHESIONS;  Surgeon: Imogene Burn. Georgette Dover, MD;  Location: [facility] [facility];  Service: General;  Laterality: N/A;    Current Outpatient Prescriptions  Medication Sig Dispense Refill  . aspirin 81 MG EC tablet Take 81 mg by mouth daily after  breakfast.       . atorvastatin (LIPITOR) 40 MG tablet Take 1 tablet (40 mg total) by mouth every evening.  30 tablet  6  . carvedilol (COREG) 12.5 MG tablet Take 1 tablet (12.5 mg total) by mouth 2 (two) times daily with a meal.  60 tablet  6  . cetirizine (ZYRTEC) 10 MG tablet Take 10 mg by mouth daily.       . cyanocobalamin (,VITAMIN B-12,) 1000 MCG/ML injection Inject 1,000 mcg into the muscle every 30 (thirty) days.      Marland Kitchen disopyramide (NORPACE) 150 MG capsule Take 1 capsule (150 mg total) by mouth 2 (two) times daily.  60 capsule  3  . furosemide (LASIX) 40 MG tablet Take 1 tablet (40 mg total) by mouth 2 (two) times daily.  60 tablet  3  . omeprazole (PRILOSEC) 20 MG capsule Take 20 mg by mouth as needed.      . potassium chloride (K-DUR,KLOR-CON) 10 MEQ tablet Take 1 tablet (10 mEq total) by mouth 2 (two) times daily with breakfast and lunch.  60 tablet  6  . verapamil (CALAN-SR) 180 MG CR tablet Take 180 mg by mouth at bedtime.       Marland Kitchen warfarin (COUMADIN) 1 MG tablet TAKE AS DIRECTED BY COUMADIN CLINIC  30 tablet  2  . warfarin (COUMADIN) 2 MG tablet TAKE 1 TABLET BY MOUTH ONCE A DAY  30 tablet  2  . [DISCONTINUED] hydrochlorothiazide (HYDRODIURIL) 25 MG tablet Take 25 mg by mouth daily after breakfast.        No current facility-administered medications for this visit.    Allergies  Allergen Reactions  . Codeine Nausea And Vomiting  . Morphine And Related Nausea And Vomiting  . Sulfonamide Derivatives     REACTION: Dry mouth    Review of Systems negative except from HPI and PMH  Physical Exam BP 136/80  Pulse 68  Ht 5\' 7"  (1.702 m)  Wt 196 lb 12 oz (89.245 kg)  BMI 30.81 kg/m2 Well developed and well nourished in no acute distress HENT normal E scleral and icterus clear Neck Supple JVP flat; carotids brisk and full Clear to ausculation { Regular rate and rhythm, no murmurs gallops or rub Soft with active bowel sounds No clubbing cyanosis  Edema Alert and oriented,  grossly normal motor and sensory function Skin Warm and Dry  ECG demonstrates P. synchronous pacing  Assessment and  Plan  Nonischemic cardiomyopathy with interval normalization of LV function  Atrial fibrillation  Renal insufficiency GFR right at 39/15  Implantable defibrillator  She has infrequent episodes of atrial fibrillation. Her disopyramide dose is reasonably appropriate given her GFR. If it falls much further, daily dosing might be appropriate.  Has been interval improvement in a week function; she is on verapamil which is reasonable.

## 2014-02-15 NOTE — Patient Instructions (Signed)
Remote monitoring is used to monitor your Pacemaker of ICD from home. This monitoring reduces the number of office visits required to check your device to one time per year. It allows [place] to keep an eye on the functioning of your device to ensure it is working properly. You are scheduled for a device check from home on 05/18/14. You may send your transmission at any time that day. If you have a wireless device, the transmission will be sent automatically. After your physician reviews your transmission, you will receive a postcard with your next transmission date.   Your physician wants you to follow-up in: 1 year with Dr. Caryl Comes. You will receive a reminder letter in the mail two months in advance. If you don't receive a letter, please call our office to schedule the follow-up appointment.

## 2014-02-18 LAB — MDC_IDC_ENUM_SESS_TYPE_INCLINIC
Battery Voltage: 2.98 V
Brady Statistic AP VP Percent: 99.26 %
Brady Statistic AP VS Percent: 0.06 %
Brady Statistic AS VP Percent: 0.51 %
Brady Statistic AS VS Percent: 0.17 %
Brady Statistic RA Percent Paced: 99.32 %
Brady Statistic RV Percent Paced: 99.78 %
Date Time Interrogation Session: 20151013144639
HighPow Impedance: 171 Ohm
HighPow Impedance: 399 Ohm
HighPow Impedance: 43 Ohm
HighPow Impedance: 58 Ohm
Lead Channel Impedance Value: 475 Ohm
Lead Channel Impedance Value: 475 Ohm
Lead Channel Pacing Threshold Amplitude: 0.75 V
Lead Channel Pacing Threshold Amplitude: 1 V
Lead Channel Pacing Threshold Pulse Width: 0.4 ms
Lead Channel Pacing Threshold Pulse Width: 0.6 ms
Lead Channel Setting Pacing Amplitude: 2 V
Lead Channel Setting Pacing Amplitude: 2.5 V
Lead Channel Setting Pacing Pulse Width: 0.4 ms
Lead Channel Setting Sensing Sensitivity: 0.3 mV
Zone Setting Detection Interval: 300 ms
Zone Setting Detection Interval: 350 ms
Zone Setting Detection Interval: 350 ms
Zone Setting Detection Interval: 400 ms

## 2014-03-02 ENCOUNTER — Ambulatory Visit (INDEPENDENT_AMBULATORY_CARE_PROVIDER_SITE_OTHER): Payer: Medicare Other

## 2014-03-02 DIAGNOSIS — E538 Deficiency of other specified B group vitamins: Secondary | ICD-10-CM

## 2014-03-02 MED ORDER — CYANOCOBALAMIN 1000 MCG/ML IJ SOLN
1000.0000 ug | Freq: Once | INTRAMUSCULAR | Status: AC
  Administered 2014-03-02: 1000 ug via INTRAMUSCULAR

## 2014-03-03 ENCOUNTER — Ambulatory Visit (INDEPENDENT_AMBULATORY_CARE_PROVIDER_SITE_OTHER): Payer: Medicare Other | Admitting: Family Medicine

## 2014-03-03 ENCOUNTER — Encounter: Payer: Self-pay | Admitting: Family Medicine

## 2014-03-03 VITALS — BP 152/79 | HR 65 | Temp 98.0°F | Ht 67.0 in | Wt 198.2 lb

## 2014-03-03 DIAGNOSIS — M7552 Bursitis of left shoulder: Secondary | ICD-10-CM

## 2014-03-03 DIAGNOSIS — M7582 Other shoulder lesions, left shoulder: Secondary | ICD-10-CM

## 2014-03-03 NOTE — Progress Notes (Signed)
Dr. Frederico Hamman T. Talayah Picardi, MD, [facility] [place], [address] Phone: [phone] Fax: [phone]  03/03/2014  Patient: [name], MRN: [id number], DOB: 1934-02-12, 80 y.o.  Primary Physician:  [name], MD  Chief Complaint: Shoulder Pain and Arm Pain  Subjective:   This 80 y.o. female patient noted above presents with shoulder pain that has been ongoing for 1 1/2 weeks. there is no history of trauma or accident recently The patient denies neck pain or radicular symptoms. Denies dislocation, subluxation, separation of the shoulder. The patient does complain of pain in the overhead plane with significant painful arc of motion.  Left shoulder for a week and a half. Put out some pine needles a couple of weeks ago. But no other injury.   Medications Tried: Tylenol Ice or Heat: minimally helpful Tried PT: No  Prior shoulder Injury: No Prior surgery: No Prior fracture: No  The PMH, PSH, Social History, Family History, Medications, and allergies have been reviewed in [facility], and have been updated if relevant.  GEN: No fevers, chills. Nontoxic. Primarily MSK c/o today. MSK: Detailed in the HPI GI: tolerating PO intake without difficulty Neuro: No numbness, parasthesias, or tingling associated. Otherwise the pertinent positives of the ROS are noted above.   Objective:   Blood pressure 152/79, pulse 65, temperature 98 F (36.7 C), temperature source Oral, height 5\' 7"  (1.702 m), weight 198 lb 4 oz (89.926 kg).  GEN: Well-developed,well-nourished,in no acute distress; alert,appropriate and cooperative throughout examination HEENT: Normocephalic and atraumatic without obvious abnormalities. Ears, externally no deformities PULM: Breathing comfortably in no respiratory distress EXT: No clubbing, cyanosis, or edema PSYCH: Normally interactive. Cooperative during the interview. Pleasant. Friendly and conversant. Not  anxious or depressed appearing. Normal, full affect.  Shoulder: L Inspection: No muscle wasting or winging Ecchymosis/edema: neg  AC joint, scapula, clavicle: NT Cervical spine: NT, full ROM Spurling's: neg Abduction: full, 5/5 Flexion: full, 5/5 IR, full, lift-off: 5/5 ER at neutral: full, 5/5 AC crossover: neg Neer: pos Hawkins: pos Drop Test: neg Empty Can: NT Supraspinatus insertion: mild-mod T Bicipital groove: NT Speed's: neg Yergason's: neg Sulcus sign: neg Scapular dyskinesis: none C5-T1 intact  Neuro: Sensation intact Grip 5/5   Radiology: No results found.  Assessment and Plan:    Subacromial bursitis, left  Rotator cuff tendonitis, left  Mild subacromial bursitis, rotator cuff tendinitis.  Likely from putting out pine needles.  At this point, simple rest below her nose.  Likely will resolve.  The patient is on anticoagulants. Tylenol for pain.  If symptoms persist in several weeks, we can easily do a subacromial injection.   Follow-up: No Follow-up on file.  New Prescriptions   No medications on file   No orders of the defined types were placed in this encounter.    Signed,  Maud Deed. Francisca Harbuck, MD   Patient's Medications  New Prescriptions   No medications on file  Previous Medications   ATORVASTATIN (LIPITOR) 40 MG TABLET    Take 1 tablet (40 mg total) by mouth every evening.   CARVEDILOL (COREG) 12.5 MG TABLET    Take 1 tablet (12.5 mg total) by mouth 2 (two) times daily with a meal.   CETIRIZINE (ZYRTEC) 10 MG TABLET    Take 10 mg by mouth daily.    CYANOCOBALAMIN (,VITAMIN B-12,) 1000 MCG/ML INJECTION    Inject 1,000 mcg into the muscle every 30 (thirty) days.   DISOPYRAMIDE (NORPACE) 150 MG  CAPSULE    Take 1 capsule (150 mg total) by mouth 2 (two) times daily.   FUROSEMIDE (LASIX) 40 MG TABLET    Take 1 tablet (40 mg total) by mouth 2 (two) times daily.   OMEPRAZOLE (PRILOSEC) 20 MG CAPSULE    Take 20 mg by mouth as needed.   POTASSIUM  CHLORIDE (K-DUR,KLOR-CON) 10 MEQ TABLET    Take 1 tablet (10 mEq total) by mouth 2 (two) times daily with breakfast and lunch.   VERAPAMIL (CALAN-SR) 180 MG CR TABLET    Take 180 mg by mouth at bedtime.    WARFARIN (COUMADIN) 1 MG TABLET    TAKE AS DIRECTED BY COUMADIN CLINIC   WARFARIN (COUMADIN) 2 MG TABLET    TAKE 1 TABLET BY MOUTH ONCE A DAY  Modified Medications   No medications on file  Discontinued Medications   ASPIRIN 81 MG EC TABLET    Take 81 mg by mouth daily after breakfast.

## 2014-03-03 NOTE — Progress Notes (Signed)
Pre visit review using our clinic review tool, if applicable. No additional management support is needed unless otherwise documented below in the visit note. 

## 2014-03-14 ENCOUNTER — Ambulatory Visit: Payer: Medicare Other

## 2014-03-21 ENCOUNTER — Ambulatory Visit: Payer: Medicare Other

## 2014-03-21 ENCOUNTER — Other Ambulatory Visit (INDEPENDENT_AMBULATORY_CARE_PROVIDER_SITE_OTHER): Payer: Medicare Other

## 2014-03-21 DIAGNOSIS — Z5181 Encounter for therapeutic drug level monitoring: Secondary | ICD-10-CM

## 2014-03-21 DIAGNOSIS — I4891 Unspecified atrial fibrillation: Secondary | ICD-10-CM

## 2014-03-21 DIAGNOSIS — Z7901 Long term (current) use of anticoagulants: Secondary | ICD-10-CM

## 2014-03-21 LAB — PROTIME-INR
INR: 2 ratio — ABNORMAL HIGH (ref 0.8–1.0)
Prothrombin Time: 21.8 s — ABNORMAL HIGH (ref 9.6–13.1)

## 2014-04-12 ENCOUNTER — Ambulatory Visit (INDEPENDENT_AMBULATORY_CARE_PROVIDER_SITE_OTHER): Payer: Medicare Other

## 2014-04-12 ENCOUNTER — Ambulatory Visit: Payer: Medicare Other

## 2014-04-12 DIAGNOSIS — E538 Deficiency of other specified B group vitamins: Secondary | ICD-10-CM

## 2014-04-12 MED ORDER — CYANOCOBALAMIN 1000 MCG/ML IJ SOLN
1000.0000 ug | Freq: Once | INTRAMUSCULAR | Status: AC
  Administered 2014-04-12: 1000 ug via INTRAMUSCULAR

## 2014-04-14 ENCOUNTER — Encounter: Payer: Self-pay | Admitting: Internal Medicine

## 2014-04-14 ENCOUNTER — Ambulatory Visit (INDEPENDENT_AMBULATORY_CARE_PROVIDER_SITE_OTHER): Payer: Medicare Other | Admitting: Internal Medicine

## 2014-04-14 VITALS — BP 126/66 | HR 78 | Temp 98.2°F | Wt 196.0 lb

## 2014-04-14 DIAGNOSIS — H6123 Impacted cerumen, bilateral: Secondary | ICD-10-CM

## 2014-04-14 DIAGNOSIS — B349 Viral infection, unspecified: Secondary | ICD-10-CM

## 2014-04-14 DIAGNOSIS — B9789 Other viral agents as the cause of diseases classified elsewhere: Secondary | ICD-10-CM

## 2014-04-14 DIAGNOSIS — J329 Chronic sinusitis, unspecified: Secondary | ICD-10-CM

## 2014-04-14 MED ORDER — AMOXICILLIN 875 MG PO TABS: 875.0000 mg | ORAL_TABLET | Freq: Two times a day (BID) | ORAL | Status: DC

## 2014-04-14 NOTE — Progress Notes (Signed)
HPI  Pt presents to the clinic today with c/o headache, facial pain and pressure, ear pain, nasal congestion and sore throat. She reports this started 4 days ago. She is blowing thick clear mucous from her nose. She denies fever, chills or body aches. She has tried Zyrtec and Tylenol OTC without relief. She does have a history of seasonal allergies but denies breathing problems. She has had sick contacts.  Review of Systems    Past Medical History  Diagnosis Date  . Carotid artery stenosis 09/2007    60-79% bilateral (stable)  . Carotid artery stenosis 10/2008    40-59% R 60-79%   . Chest pain, unspecified   . Atrial fibrillation   . Sinus bradycardia   . Hypotension, unspecified     cardiac cath 2006..nonobstructive CAD 30-40s lesions.Marland KitchenETT 1/09 nondiagnostic due to poor HR response..Right Renal Cancer 2003  . Blood in stool   . Secondary cardiomyopathy, unspecified   . Chronic airway obstruction, not elsewhere classified   . Long term (current) use of anticoagulants   . Dysuria   . Edema   . Encounter for therapeutic drug monitoring   . Other malaise and fatigue   . Hypertrophic obstructive cardiomyopathy   . Urge incontinence   . Other acute sinusitis   . Other and unspecified coagulation defects   . Other screening mammogram   . Pre-operative cardiovascular examination   . Hemorrhage of rectum and anus   . Chronic kidney disease, unspecified   . Special screening for osteoporosis   . Hypertension 05/20/2011  . PONV (postoperative nausea and vomiting)   . Adjustment disorder with anxiety   . Coronary artery disease     non-obstructive by 2006 cath  . Automatic implantable cardioverter-defibrillator in situ   . High cholesterol   . Anginal pain   . Chronic bronchitis     "get it some; not q year" (11/11/2012)  . Exertional shortness of breath   . History of blood transfusion 04/2011    "after hip OR" (11/11/2012)  . Arthritis     "some in my hands" (11/11/2012)  .  Osteoarthritis of right hip   . Renal cancer 06/2001    Right  . Malignant neoplasm of kidney, except pelvis   . Frequent UTI     "get them a couple times/yr" (11/11/2012)  . Chronic diastolic CHF (congestive heart failure) 06/04/2013    Family History  Problem Relation Age of Onset  . Colon cancer Neg Hx   . Heart failure Mother     History   Social History  . Marital Status: Widowed    Spouse Name: N/A    Number of Children: N/A  . Years of Education: N/A   Occupational History  . Retired    Social History Main Topics  . Smoking status: Former Smoker -- 0.50 packs/day for 40 years    Types: Cigarettes    Quit date: 05/06/2001  . Smokeless tobacco: Never Used  . Alcohol Use: No  . Drug Use: No  . Sexual Activity: No   Other Topics Concern  . 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   ALCOHOL USE-NO   RETIRED   FORMER TOBACCO USE.....1/2 PACK X 40 YRS UNTIL 2003    Allergies  Allergen Reactions  . Codeine Nausea  And Vomiting  . Morphine And Related Nausea And Vomiting  . Sulfonamide Derivatives     REACTION: Dry mouth     Constitutional: Positive headache. Denies fatigue, fever or  abrupt weight changes.  HEENT:  Positive facial pain, nasal congestion and sore throat. Denies eye redness, ear pain, ringing in the ears, wax buildup, runny nose or bloody nose. Respiratory: Positive cough. Denies difficulty breathing or shortness of breath.  Cardiovascular: Denies chest pain, chest tightness, palpitations or swelling in the hands or feet.   No other specific complaints in a complete review of systems (except as listed in HPI above).  Objective:  BP 126/66 mmHg  Pulse 78  Temp(Src) 98.2 F (36.8 C) (Oral)  Wt 196 lb (88.905 kg)  SpO2 99%   General: Appears her stated age, chronically ill  appearing in NAD. HEENT: Head: normal shape and size, no sinus tenderness noted;  Ears: Bilateral cerumen impaction; Nose: mucosa pink and moist, septum midline; Throat/Mouth: + PND. Teeth present, mucosa erythematous and moist, no exudate noted, no lesions or ulcerations noted.  Cardiovascular: Normal rate and rhythm. Distant S1,S2 noted.    Pulmonary/Chest: Normal effort and positive vesicular breath sounds. No respiratory distress. No wheezes, rales or ronchi noted.      Assessment & Plan:   Viral sinusitis  Can use a Neti Pot which can be purchased from your local drug store. Flonase 2 sprays each nostril for 3 days and then as needed. She is insistent that she is not leaving here without an antibiotic. Discussed that there is no role for antibiotics with viral illness. Will give RX for amoxil to start on Saturday if symptoms worsens Ears lavaged by CMA  RTC as needed or if symptoms persist.

## 2014-04-14 NOTE — Patient Instructions (Signed)

## 2014-04-14 NOTE — Progress Notes (Signed)
Pre visit review using our clinic review tool, if applicable. No additional management support is needed unless otherwise documented below in the visit note. 

## 2014-04-15 ENCOUNTER — Other Ambulatory Visit: Payer: Self-pay | Admitting: Family Medicine

## 2014-04-18 ENCOUNTER — Other Ambulatory Visit: Payer: Self-pay

## 2014-04-18 MED ORDER — FUROSEMIDE 40 MG PO TABS: 40.0000 mg | ORAL_TABLET | Freq: Two times a day (BID) | ORAL | Status: DC

## 2014-04-18 NOTE — Telephone Encounter (Signed)
Refill sent for furosemide  

## 2014-05-02 ENCOUNTER — Ambulatory Visit: Payer: Medicare Other

## 2014-05-09 ENCOUNTER — Ambulatory Visit (INDEPENDENT_AMBULATORY_CARE_PROVIDER_SITE_OTHER): Payer: PPO | Admitting: Family Medicine

## 2014-05-09 DIAGNOSIS — Z7902 Long term (current) use of antithrombotics/antiplatelets: Secondary | ICD-10-CM

## 2014-05-09 LAB — POCT INR: INR: 2.3

## 2014-05-09 MED ORDER — CYANOCOBALAMIN 1000 MCG/ML IJ SOLN
1000.0000 ug | Freq: Once | INTRAMUSCULAR | Status: AC
  Administered 2014-05-09: 1000 ug via INTRAMUSCULAR

## 2014-05-10 ENCOUNTER — Ambulatory Visit: Payer: Medicare Other

## 2014-05-16 ENCOUNTER — Encounter: Payer: Self-pay | Admitting: *Deleted

## 2014-05-17 ENCOUNTER — Other Ambulatory Visit: Payer: Self-pay | Admitting: Family Medicine

## 2014-05-17 ENCOUNTER — Telehealth: Payer: Self-pay

## 2014-05-17 NOTE — Telephone Encounter (Signed)
Pt called, states she has changed her insurance, and they need a coverage of determination for Disopyramide 150 mg. Pt states they have a 72 hour time frame. Her insurance is also faxing this.

## 2014-05-18 ENCOUNTER — Encounter: Payer: PPO | Admitting: *Deleted

## 2014-05-18 NOTE — Telephone Encounter (Signed)
Received paperwork.  On Dr. Donivan Scull desk for his review, though this may need to go to Dr. Caryl Comes.

## 2014-05-24 NOTE — Telephone Encounter (Signed)
Pt called again, states she needs this medication, but needs these pages filled out(see previous phone note ), states she can get this med for $4.

## 2014-05-24 NOTE — Telephone Encounter (Signed)
Spoke w/ pt. Advised her that I have received paperwork indicating that med was denied.  She states that she has run into problems w/ this med before and that it only cost her $4 when the prior auth was sent in. She would like to be made aware when this has been sent in.

## 2014-05-25 NOTE — Telephone Encounter (Signed)
Pt called back, wanted to check on the status of her auth for her medication. She states there is a 72 hour window to get this done. Please call.

## 2014-05-25 NOTE — Telephone Encounter (Signed)
This encounter was created in error - please disregard.

## 2014-05-25 NOTE — Telephone Encounter (Signed)
Notified patient sent in prior authorization for the norpace. Told patient as soon as we here from the prior approval, we will contact her.

## 2014-05-26 NOTE — Telephone Encounter (Signed)
Notified patient still haven't heard from the prior authorization that she will need to call tomorrow and check the status since we will be closed due to the weather.

## 2014-05-26 NOTE — Telephone Encounter (Signed)
Spoke with [name] regarding prior authorization, told her the appeal is in process and hopefully we will here something from insurance company today.

## 2014-05-31 ENCOUNTER — Ambulatory Visit: Payer: PPO | Admitting: Cardiovascular Disease

## 2014-06-02 NOTE — Telephone Encounter (Signed)
Notified Soni per [employer] with [facility] spoke with Tamia and the prior authorization is still in process.

## 2014-06-07 ENCOUNTER — Other Ambulatory Visit: Payer: Self-pay

## 2014-06-07 MED ORDER — DISOPYRAMIDE PHOSPHATE 150 MG PO CAPS: 150.0000 mg | ORAL_CAPSULE | Freq: Two times a day (BID) | ORAL | Status: DC

## 2014-06-07 NOTE — Telephone Encounter (Signed)
Refill sent for disopyramide phospha 150 mg

## 2014-06-09 ENCOUNTER — Encounter: Payer: Self-pay | Admitting: Cardiovascular Disease

## 2014-06-09 ENCOUNTER — Ambulatory Visit (INDEPENDENT_AMBULATORY_CARE_PROVIDER_SITE_OTHER): Payer: PPO | Admitting: Cardiovascular Disease

## 2014-06-09 ENCOUNTER — Ambulatory Visit: Payer: Self-pay | Admitting: Cardiovascular Disease

## 2014-06-09 ENCOUNTER — Telehealth: Payer: Self-pay | Admitting: Cardiovascular Disease

## 2014-06-09 VITALS — BP 140/78 | HR 66 | Ht 67.0 in | Wt 197.5 lb

## 2014-06-09 DIAGNOSIS — N183 Chronic kidney disease, stage 3 unspecified: Secondary | ICD-10-CM

## 2014-06-09 DIAGNOSIS — I5032 Chronic diastolic (congestive) heart failure: Secondary | ICD-10-CM

## 2014-06-09 DIAGNOSIS — E785 Hyperlipidemia, unspecified: Secondary | ICD-10-CM

## 2014-06-09 DIAGNOSIS — I4891 Unspecified atrial fibrillation: Secondary | ICD-10-CM

## 2014-06-09 DIAGNOSIS — N189 Chronic kidney disease, unspecified: Secondary | ICD-10-CM

## 2014-06-09 DIAGNOSIS — R079 Chest pain, unspecified: Secondary | ICD-10-CM

## 2014-06-09 DIAGNOSIS — R0602 Shortness of breath: Secondary | ICD-10-CM

## 2014-06-09 DIAGNOSIS — N2889 Other specified disorders of kidney and ureter: Secondary | ICD-10-CM

## 2014-06-09 DIAGNOSIS — I10 Essential (primary) hypertension: Secondary | ICD-10-CM

## 2014-06-09 DIAGNOSIS — I6523 Occlusion and stenosis of bilateral carotid arteries: Secondary | ICD-10-CM

## 2014-06-09 LAB — HEPATIC FUNCTION PANEL A (ARMC)
Albumin: 3.8 g/dL (ref 3.4–5.0)
Alkaline Phosphatase: 71 U/L (ref 46–116)
Bilirubin, Direct: <0.1 mg/dL (ref 0.0–0.2)
Bilirubin,Total: 0.3 mg/dL (ref 0.2–1.0)
SGOT(AST): 24 U/L (ref 15–37)
SGPT (ALT): 23 U/L (ref 14–63)
Total Protein: 7.2 g/dL (ref 6.4–8.2)

## 2014-06-09 LAB — LIPID PANEL
Cholesterol: 271 mg/dL — ABNORMAL HIGH (ref 0–200)
HDL Cholesterol: 40 mg/dL (ref 40–60)
Ldl Cholesterol, Calc: 183 mg/dL — ABNORMAL HIGH (ref 0–100)
Triglycerides: 241 mg/dL — ABNORMAL HIGH (ref 0–200)
VLDL Cholesterol, Calc: 48 mg/dL — ABNORMAL HIGH (ref 5–40)

## 2014-06-09 NOTE — Progress Notes (Signed)
Patient ID: [name], female    DOB: [birth year], 81 y.o.   MRN: [id number]  HPI Comments: Ms. Rohal is a 81 year old woman with a history of syncope, COPD, as well as symptomatic bradycardia.  She is status post ICD.  history of nonobstructive coronary artery disease with chronic chest pain, bilateral carotid artery stenosis 60-70% disease on the left, paroxysmal atrial fibrillation, renal cell carcinoma status post resection of her right kidney with residual chronic renal insufficiency, and COPD.   She has chronic leg pain She presents for routine follow-up of her carotid arterial disease, atrial fibrillation.  Overall she reports that she is doing well. Recently seen by Dr. Ronalee Belts, told that she had blockages in her legs. Unable to do angiogram secondary to renal dysfunction. She reports very little atrial fibrillation when she takes Norpace 150 mg twice a day. Recently she has been having trouble getting this medication from her new insurance company. She did try flecainide in the past and she had significant side effects as well as breakthrough arrhythmia. She is tolerating her cholesterol medication. No recent lab work in 3 years. She is overdue for carotid ultrasound She does not do any regular exercise, she is concerned about her weight.  Prior ABI/lower extremity Doppler may 2013 did not show significant PAD. She has tried to hold her Lipitor with no improvement of her leg pain. No recent episodes of near syncope or syncope. Takes her Lasix but not consistently.  EKG on today's visit shows AV paced rhythm, rate 66 bpm   Other past medical history  right hip replacement on May 03 2011.    Chronic dizziness since the summer of 2014 . Symptoms were worse in December 2014  Echocardiogram showed normal ejection fraction, moderate mitral valve regurgitation, moderate TR, moderately elevated right ventricular systolic pressures.   stress test in March 2011.    Last  carotid u/s 40-59% R 60-79%. No neuro symptoms.     Cardiac cath 2006    --non-obstructive CAD 30-40s lesions She reports having repeat catheterization since that time with no significant progression of her disease   Allergies  Allergen Reactions  . Codeine Nausea And Vomiting  . Morphine And Related Nausea And Vomiting  . Sulfonamide Derivatives     REACTION: Dry mouth    Outpatient Encounter Prescriptions as of 79/08/2014  Medication Sig  . amoxicillin (AMOXIL) 875 MG tablet Take 1 tablet (875 mg total) by mouth 2 (two) times daily.  Marland Kitchen atorvastatin (LIPITOR) 40 MG tablet Take 1 tablet (40 mg total) by mouth every evening.  . carvedilol (COREG) 12.5 MG tablet Take 1 tablet (12.5 mg total) by mouth 2 (two) times daily with a meal.  . cetirizine (ZYRTEC) 10 MG tablet Take 10 mg by mouth daily.   . cyanocobalamin (,VITAMIN B-12,) 1000 MCG/ML injection Inject 1,000 mcg into the muscle once.   . disopyramide (NORPACE) 150 MG capsule Take 1 capsule (150 mg total) by mouth 2 (two) times daily.  . furosemide (LASIX) 40 MG tablet Take 1 tablet (40 mg total) by mouth 2 (two) times daily.  Marland Kitchen omeprazole (PRILOSEC) 20 MG capsule Take 20 mg by mouth as needed.  . potassium chloride (K-DUR,KLOR-CON) 10 MEQ tablet Take 1 tablet (10 mEq total) by mouth 2 (two) times daily with breakfast and lunch.  . verapamil (CALAN-SR) 180 MG CR tablet Take 180 mg by mouth at bedtime.   Marland Kitchen warfarin (COUMADIN) 1 MG tablet TAKE AS DIRECTED BY COUMADIN CLINIC  .  warfarin (COUMADIN) 2 MG tablet TAKE ONE TABLET BY MOUTH EVERY DAY    Past Medical History  Diagnosis Date  . Carotid artery stenosis 09/2007    60-79% bilateral (stable)  . Carotid artery stenosis 10/2008    40-59% R 60-79%   . Chest pain, unspecified   . Atrial fibrillation   . Sinus bradycardia   . Hypotension, unspecified     cardiac cath 2006..nonobstructive CAD 30-40s lesions.Marland KitchenETT 1/09 nondiagnostic due to poor HR response..Right Renal Cancer 2003   . Blood in stool   . Secondary cardiomyopathy, unspecified   . Chronic airway obstruction, not elsewhere classified   . Long term (current) use of anticoagulants   . Dysuria   . Edema   . Encounter for therapeutic drug monitoring   . Other malaise and fatigue   . Hypertrophic obstructive cardiomyopathy   . Urge incontinence   . Other acute sinusitis   . Other and unspecified coagulation defects   . Other screening mammogram   . Pre-operative cardiovascular examination   . Hemorrhage of rectum and anus   . Chronic kidney disease, unspecified   . Special screening for osteoporosis   . Hypertension 05/20/2011  . PONV (postoperative nausea and vomiting)   . Adjustment disorder with anxiety   . Coronary artery disease     non-obstructive by 2006 cath  . Automatic implantable cardioverter-defibrillator in situ   . High cholesterol   . Anginal pain   . Chronic bronchitis     "get it some; not q year" (11/11/2012)  . Exertional shortness of breath   . History of blood transfusion 04/2011    "after hip OR" (11/11/2012)  . Arthritis     "some in my hands" (11/11/2012)  . Osteoarthritis of right hip   . Renal cancer 06/2001    Right  . Malignant neoplasm of kidney, except pelvis   . Frequent UTI     "get them a couple times/yr" (11/11/2012)  . Chronic diastolic CHF (congestive heart failure) 06/04/2013    Past Surgical History  Procedure Laterality Date  . Abdominal hysterectomy  1975    for benign causes  . Cardiac catheterization  2006  . Nephrectomy Right 06/2001     S/P RENAL CELL CANCER  . Cataract extraction w/ intraocular lens  implant, bilateral  01/2006-02-2006  . Insert / replace / remove pacemaker  05-01-11    02-28-05-/05-04-10-ICD-MEDTRONIC MAXIMAL DR  . Appendectomy    . Total hip arthroplasty Right 05/03/2011    Procedure: TOTAL HIP ARTHROPLASTY ANTERIOR APPROACH;  Surgeon: Mcarthur Rossetti;  Location: [facility];  Service: Orthopedics;  Laterality: Right;  Removal of  Cannulated Screws Right Hip, Right Direct Anterior Hip Replacement  . Laparoscopic cholecystectomy  11/11/2012  . Bi-ventricular pacemaker upgrade  05/04/2010  . Joint replacement    . Cholecystectomy N/A 11/11/2012    Procedure: LAPAROSCOPIC CHOLECYSTECTOMY WITH INTRAOPERATIVE CHOLANGIOGRAM;  Surgeon: Imogene Burn. Georgette Dover, MD;  Location: [facility];  Service: General;  Laterality: N/A;  . Laparoscopic lysis of adhesions N/A 11/11/2012    Procedure: LAPAROSCOPIC LYSIS OF ADHESIONS;  Surgeon: Imogene Burn. Georgette Dover, MD;  Location: [facility];  Service: General;  Laterality: N/A;    Social History  reports that she quit smoking about 13 years ago. Her smoking use included Cigarettes. She has a 20 pack-year smoking history. She has never used smokeless tobacco. She reports that she does not drink alcohol or use illicit drugs.  Family History family history includes Heart failure in her mother.  There is no history of Colon cancer.       Review of Systems  Constitutional: Negative.   Respiratory: Positive for shortness of breath.   Cardiovascular: Negative.   Gastrointestinal: Negative.   Musculoskeletal:       Leg pain  Skin: Negative.   Neurological: Negative.   Hematological: Negative.   Psychiatric/Behavioral: Negative.   All other systems reviewed and are negative.   BP 140/78 mmHg  Pulse 66  Ht 5\' 7"  (1.702 m)  Wt 197 lb 8 oz (89.585 kg)  BMI 30.93 kg/m2  Physical Exam  Constitutional: She is oriented to person, place, and time. She appears well-developed and well-nourished.  HENT:  Head: Normocephalic.  Nose: Nose normal.  Mouth/Throat: Oropharynx is clear and moist.  Eyes: Conjunctivae are normal. Pupils are equal, round, and reactive to light.  Neck: Normal range of motion. Neck supple. No JVD present.  Cardiovascular: Normal rate, regular rhythm, S1 normal, S2 normal and intact distal pulses.  Exam reveals no gallop and no friction rub.   Murmur heard.  Systolic murmur is present with a  grade of 2/6  Pulmonary/Chest: Effort normal and breath sounds normal. No respiratory distress. She has no wheezes. She has no rales. She exhibits no tenderness.  Abdominal: Soft. Bowel sounds are normal. She exhibits no distension. There is no tenderness.  Musculoskeletal: Normal range of motion. She exhibits no edema or tenderness.  Lymphadenopathy:    She has no cervical adenopathy.  Neurological: She is alert and oriented to person, place, and time. Coordination normal.  Skin: Skin is warm and dry. No rash noted. No erythema.  Psychiatric: She has a normal mood and affect. Her behavior is normal. Judgment and thought content normal.    Assessment and Plan  Nursing note and vitals reviewed.

## 2014-06-09 NOTE — Telephone Encounter (Signed)
Spoke with Dr. Sherrian Divers with [facility] regarding the appeal for the disopyramide phos 150 mg one tablet twice a day. Notified patient that the appeal is in progress and we will contact her as soon as we here if approved.

## 2014-06-09 NOTE — Assessment & Plan Note (Signed)
Doing well on Norpace. Will try to petition the insurance company to fill her medication. She has been recently paying out-of-pocket. She did not tolerate flecainide in the past, had side effects and breakthrough arrhythmia. Currently on a beta blocker as well as calcium channel blocker. Sotalol would not be a good choice. If not approved, could potentially try Rythmol.

## 2014-06-09 NOTE — Telephone Encounter (Signed)
Diagnosis for the disopyramide 150 mg is A-fib.

## 2014-06-09 NOTE — Assessment & Plan Note (Signed)
She is followed by nephrology. No recent lab work available.

## 2014-06-09 NOTE — Assessment & Plan Note (Addendum)
No recent labs, will order today

## 2014-06-09 NOTE — Assessment & Plan Note (Signed)
Encouraged her to stay on her Lasix, weight is relatively stable. Appears relatively euvolemic on today's visit

## 2014-06-09 NOTE — Patient Instructions (Addendum)
You are doing well. No medication changes were made.  We will order a repeat carotid ultrasound for stenosis  Labs ordered today  Please call us if you have new issues that need to be addressed before your next appt.  Your physician wants you to follow-up in: 6 months.  You will receive a reminder letter in the mail two months in advance. If you don't receive a letter, please call our office to schedule the follow-up appointment.

## 2014-06-09 NOTE — Telephone Encounter (Signed)
See note

## 2014-06-09 NOTE — Assessment & Plan Note (Signed)
60-70% disease on the left over one year ago. Repeat carotid ultrasound ordered

## 2014-06-09 NOTE — Telephone Encounter (Signed)
Needs diagnosis and dr. Ashok Croon that this drug risks have been expained and the patient needs to continue taking.

## 2014-06-09 NOTE — Assessment & Plan Note (Signed)
Blood pressure is well controlled on today's visit. No changes made to the medications. 

## 2014-06-10 ENCOUNTER — Other Ambulatory Visit: Payer: Self-pay

## 2014-06-10 ENCOUNTER — Telehealth: Payer: Self-pay | Admitting: *Deleted

## 2014-06-10 MED ORDER — ATORVASTATIN CALCIUM 40 MG PO TABS: 40.0000 mg | ORAL_TABLET | Freq: Every evening | ORAL | Status: DC

## 2014-06-10 NOTE — Telephone Encounter (Signed)
Notified patient medication is covered but it takes up to 72 hours for insurance company to process. The patient will contact the pharmacy on Monday to see if can process then.

## 2014-06-10 NOTE — Telephone Encounter (Signed)
Notified Jaimelyn that Dr. Sherrian Divers with [facility] that the Disopyramide 150 mg one tablet twice a day for 60 tablets for 30 days.

## 2014-06-10 NOTE — Telephone Encounter (Signed)
[facility] called stating that they just spoke to one of our nurses about this patient. She states that they are still waiting on insurance to approve this medication  They can give it until Monday to get an approval on Disopyramide.  She also stated that if they do not get approved in time, then we could need to resend a letter to the company trying to get it reapproved.   Please call breanna there at [phone]. If we have any questions.

## 2014-06-13 NOTE — Telephone Encounter (Signed)
Pharmacy states that the Disopyramide 150 mg twice daily for 60 tablets has bee approved. LMOM to let the patient aware of this as well.

## 2014-06-14 ENCOUNTER — Ambulatory Visit: Payer: Medicare Other

## 2014-06-20 ENCOUNTER — Other Ambulatory Visit: Payer: PPO

## 2014-06-24 ENCOUNTER — Other Ambulatory Visit (INDEPENDENT_AMBULATORY_CARE_PROVIDER_SITE_OTHER): Payer: PPO

## 2014-06-24 ENCOUNTER — Ambulatory Visit (INDEPENDENT_AMBULATORY_CARE_PROVIDER_SITE_OTHER): Payer: PPO | Admitting: *Deleted

## 2014-06-24 DIAGNOSIS — D519 Vitamin B12 deficiency anemia, unspecified: Secondary | ICD-10-CM

## 2014-06-24 DIAGNOSIS — Z7901 Long term (current) use of anticoagulants: Secondary | ICD-10-CM

## 2014-06-24 DIAGNOSIS — I4891 Unspecified atrial fibrillation: Secondary | ICD-10-CM

## 2014-06-24 DIAGNOSIS — Z5181 Encounter for therapeutic drug level monitoring: Secondary | ICD-10-CM

## 2014-06-24 DIAGNOSIS — I480 Paroxysmal atrial fibrillation: Secondary | ICD-10-CM

## 2014-06-24 LAB — POCT INR: INR: 2.3

## 2014-06-24 MED ORDER — CYANOCOBALAMIN 1000 MCG/ML IJ SOLN
1000.0000 ug | Freq: Once | INTRAMUSCULAR | Status: AC
  Administered 2014-06-24: 1000 ug via INTRAMUSCULAR

## 2014-07-06 ENCOUNTER — Other Ambulatory Visit: Payer: Self-pay

## 2014-07-06 MED ORDER — CARVEDILOL 12.5 MG PO TABS: 12.5000 mg | ORAL_TABLET | Freq: Two times a day (BID) | ORAL | Status: DC

## 2014-07-06 NOTE — Telephone Encounter (Signed)
Refill sent for carvedilol.  

## 2014-07-07 ENCOUNTER — Encounter (INDEPENDENT_AMBULATORY_CARE_PROVIDER_SITE_OTHER): Payer: PPO

## 2014-07-07 DIAGNOSIS — I6523 Occlusion and stenosis of bilateral carotid arteries: Secondary | ICD-10-CM

## 2014-07-18 ENCOUNTER — Telehealth: Payer: Self-pay

## 2014-07-18 NOTE — Telephone Encounter (Signed)
Pt states she has seen a vein specialist,and they have told her surgery is not an option due to her kidney condition. She states that Friday night she had a place in her left leg that was burning and stinging "so bad", has moved from her left ankle to her left knee, she is concerned this may be a clot, and is not sure who she should call. Please call and advise.

## 2014-07-18 NOTE — Telephone Encounter (Signed)
Spoke w/ pt.  She reports that on Friday night, she had a spot on her lower leg that was "burning and burning and hurting", was hot to the touch and had some redness. States that her sister made her call today.  She reports that sx have subsided, still hurting, but not as bad, no longer red, but "barely warm and pink". Pt is on coumadin therapy.  She states that she "always have swelling due to my kidneys". Spoke w/ Dr. Rockey Situ to see if we needed to send pt for u/s. He recommends that pt continue to keep an eye on sx, DVT very unlikely due to to coumadin.  Advised her that if sx worsen, to contact her PCP or I can take a look at to see if it is possibly cellulitis.  She is appreciative and will call back if we can be of further assistance.

## 2014-07-21 ENCOUNTER — Other Ambulatory Visit (INDEPENDENT_AMBULATORY_CARE_PROVIDER_SITE_OTHER): Payer: PPO

## 2014-07-21 ENCOUNTER — Ambulatory Visit (INDEPENDENT_AMBULATORY_CARE_PROVIDER_SITE_OTHER): Payer: PPO

## 2014-07-21 DIAGNOSIS — E538 Deficiency of other specified B group vitamins: Secondary | ICD-10-CM

## 2014-07-21 DIAGNOSIS — I4891 Unspecified atrial fibrillation: Secondary | ICD-10-CM

## 2014-07-21 DIAGNOSIS — Z7901 Long term (current) use of anticoagulants: Secondary | ICD-10-CM

## 2014-07-21 DIAGNOSIS — Z5181 Encounter for therapeutic drug level monitoring: Secondary | ICD-10-CM

## 2014-07-21 LAB — POCT INR: INR: 2.3

## 2014-07-21 MED ORDER — CYANOCOBALAMIN 1000 MCG/ML IJ SOLN
1000.0000 ug | Freq: Once | INTRAMUSCULAR | Status: AC
  Administered 2014-07-21: 1000 ug via INTRAMUSCULAR

## 2014-08-16 ENCOUNTER — Emergency Department (HOSPITAL_COMMUNITY): Payer: PPO

## 2014-08-16 ENCOUNTER — Observation Stay (HOSPITAL_COMMUNITY): Payer: PPO

## 2014-08-16 ENCOUNTER — Telehealth: Payer: Self-pay | Admitting: Family Medicine

## 2014-08-16 ENCOUNTER — Encounter (HOSPITAL_COMMUNITY): Payer: Self-pay | Admitting: *Deleted

## 2014-08-16 ENCOUNTER — Observation Stay (HOSPITAL_COMMUNITY)
Admission: EM | Admit: 2014-08-16 | Discharge: 2014-08-18 | Disposition: A | Discharge status: Home / Self Care | Payer: PPO | Attending: Internal Medicine | Admitting: Internal Medicine

## 2014-08-16 ENCOUNTER — Other Ambulatory Visit (HOSPITAL_COMMUNITY): Payer: Self-pay

## 2014-08-16 DIAGNOSIS — C649 Malignant neoplasm of unspecified kidney, except renal pelvis: Secondary | ICD-10-CM | POA: Insufficient documentation

## 2014-08-16 DIAGNOSIS — Z882 Allergy status to sulfonamides status: Secondary | ICD-10-CM | POA: Diagnosis not present

## 2014-08-16 DIAGNOSIS — I6523 Occlusion and stenosis of bilateral carotid arteries: Secondary | ICD-10-CM

## 2014-08-16 DIAGNOSIS — I129 Hypertensive chronic kidney disease with stage 1 through stage 4 chronic kidney disease, or unspecified chronic kidney disease: Secondary | ICD-10-CM | POA: Diagnosis not present

## 2014-08-16 DIAGNOSIS — I739 Peripheral vascular disease, unspecified: Secondary | ICD-10-CM | POA: Insufficient documentation

## 2014-08-16 DIAGNOSIS — E785 Hyperlipidemia, unspecified: Secondary | ICD-10-CM | POA: Insufficient documentation

## 2014-08-16 DIAGNOSIS — I4891 Unspecified atrial fibrillation: Secondary | ICD-10-CM | POA: Insufficient documentation

## 2014-08-16 DIAGNOSIS — Z885 Allergy status to narcotic agent status: Secondary | ICD-10-CM | POA: Diagnosis not present

## 2014-08-16 DIAGNOSIS — G459 Transient cerebral ischemic attack, unspecified: Secondary | ICD-10-CM | POA: Diagnosis not present

## 2014-08-16 DIAGNOSIS — Z87891 Personal history of nicotine dependence: Secondary | ICD-10-CM | POA: Diagnosis not present

## 2014-08-16 DIAGNOSIS — Z85528 Personal history of other malignant neoplasm of kidney: Secondary | ICD-10-CM | POA: Insufficient documentation

## 2014-08-16 DIAGNOSIS — I482 Chronic atrial fibrillation: Secondary | ICD-10-CM

## 2014-08-16 DIAGNOSIS — R7989 Other specified abnormal findings of blood chemistry: Secondary | ICD-10-CM | POA: Insufficient documentation

## 2014-08-16 DIAGNOSIS — I10 Essential (primary) hypertension: Secondary | ICD-10-CM | POA: Diagnosis not present

## 2014-08-16 DIAGNOSIS — I5032 Chronic diastolic (congestive) heart failure: Secondary | ICD-10-CM | POA: Insufficient documentation

## 2014-08-16 DIAGNOSIS — Z95810 Presence of automatic (implantable) cardiac defibrillator: Secondary | ICD-10-CM | POA: Diagnosis not present

## 2014-08-16 DIAGNOSIS — Z79899 Other long term (current) drug therapy: Secondary | ICD-10-CM | POA: Diagnosis not present

## 2014-08-16 DIAGNOSIS — Z7901 Long term (current) use of anticoagulants: Secondary | ICD-10-CM | POA: Insufficient documentation

## 2014-08-16 DIAGNOSIS — I4821 Permanent atrial fibrillation: Secondary | ICD-10-CM | POA: Diagnosis present

## 2014-08-16 DIAGNOSIS — Z905 Acquired absence of kidney: Secondary | ICD-10-CM | POA: Insufficient documentation

## 2014-08-16 DIAGNOSIS — N189 Chronic kidney disease, unspecified: Secondary | ICD-10-CM | POA: Diagnosis not present

## 2014-08-16 DIAGNOSIS — I6529 Occlusion and stenosis of unspecified carotid artery: Secondary | ICD-10-CM | POA: Diagnosis present

## 2014-08-16 LAB — URINE MICROSCOPIC-ADD ON

## 2014-08-16 LAB — DIFFERENTIAL
Basophils Absolute: 0 10*3/uL (ref 0.0–0.1)
Basophils Relative: 0 % (ref 0–1)
Eosinophils Absolute: 0.1 10*3/uL (ref 0.0–0.7)
Eosinophils Relative: 3 % (ref 0–5)
Lymphocytes Relative: 28 % (ref 12–46)
Lymphs Abs: 1.3 10*3/uL (ref 0.7–4.0)
Monocytes Absolute: 0.6 10*3/uL (ref 0.1–1.0)
Monocytes Relative: 12 % (ref 3–12)
Neutro Abs: 2.6 10*3/uL (ref 1.7–7.7)
Neutrophils Relative %: 57 % (ref 43–77)

## 2014-08-16 LAB — PROTIME-INR
INR: 2.09 — ABNORMAL HIGH (ref 0.00–1.49)
Prothrombin Time: 23.7 seconds — ABNORMAL HIGH (ref 11.6–15.2)

## 2014-08-16 LAB — COMPREHENSIVE METABOLIC PANEL
ALT: 15 U/L (ref 0–35)
AST: 24 U/L (ref 0–37)
Albumin: 4 g/dL (ref 3.5–5.2)
Alkaline Phosphatase: 73 U/L (ref 39–117)
Anion gap: 9 (ref 5–15)
BUN: 15 mg/dL (ref 6–23)
CO2: 28 mmol/L (ref 19–32)
Calcium: 8.9 mg/dL (ref 8.4–10.5)
Chloride: 102 mmol/L (ref 96–112)
Creatinine, Ser: 1.49 mg/dL — ABNORMAL HIGH (ref 0.50–1.10)
GFR calc Af Amer: 38 mL/min — ABNORMAL LOW (ref >90)
GFR calc non Af Amer: 32 mL/min — ABNORMAL LOW (ref >90)
Glucose, Bld: 86 mg/dL (ref 70–99)
Potassium: 4.1 mmol/L (ref 3.5–5.1)
Sodium: 139 mmol/L (ref 135–145)
Total Bilirubin: 0.6 mg/dL (ref 0.3–1.2)
Total Protein: 6.5 g/dL (ref 6.0–8.3)

## 2014-08-16 LAB — CBC
HCT: 34.2 % — ABNORMAL LOW (ref 36.0–46.0)
Hemoglobin: 10.9 g/dL — ABNORMAL LOW (ref 12.0–15.0)
MCH: 27.4 pg (ref 26.0–34.0)
MCHC: 31.9 g/dL (ref 30.0–36.0)
MCV: 85.9 fL (ref 78.0–100.0)
Platelets: 184 10*3/uL (ref 150–400)
RBC: 3.98 MIL/uL (ref 3.87–5.11)
RDW: 16.3 % — ABNORMAL HIGH (ref 11.5–15.5)
WBC: 4.6 10*3/uL (ref 4.0–10.5)

## 2014-08-16 LAB — I-STAT TROPONIN, ED: Troponin i, poc: 0 ng/mL (ref 0.00–0.08)

## 2014-08-16 LAB — URINALYSIS, ROUTINE W REFLEX MICROSCOPIC
Bilirubin Urine: NEGATIVE
Glucose, UA: NEGATIVE mg/dL
Ketones, ur: NEGATIVE mg/dL
Nitrite: NEGATIVE
Protein, ur: NEGATIVE mg/dL
Specific Gravity, Urine: 1.014 (ref 1.005–1.030)
Urobilinogen, UA: 1 mg/dL (ref 0.0–1.0)
pH: 7 (ref 5.0–8.0)

## 2014-08-16 LAB — APTT: aPTT: 47 seconds — ABNORMAL HIGH (ref 24–37)

## 2014-08-16 LAB — CBG MONITORING, ED: Glucose-Capillary: 92 mg/dL (ref 70–99)

## 2014-08-16 MED ORDER — ATORVASTATIN CALCIUM 40 MG PO TABS
40.0000 mg | ORAL_TABLET | Freq: Every evening | ORAL | Status: DC
  Administered 2014-08-16: 40 mg via ORAL
  Filled 2014-08-16: qty 1

## 2014-08-16 MED ORDER — DISOPYRAMIDE PHOSPHATE 150 MG PO CAPS
150.0000 mg | ORAL_CAPSULE | Freq: Two times a day (BID) | ORAL | Status: DC
  Administered 2014-08-17 – 2014-08-18 (×3): 150 mg via ORAL
  Filled 2014-08-16 (×5): qty 1

## 2014-08-16 MED ORDER — SODIUM CHLORIDE 0.9 % IV SOLN
INTRAVENOUS | Status: DC
Start: 2014-08-16 — End: 2014-08-17
  Administered 2014-08-16: 1000 mL via INTRAVENOUS

## 2014-08-16 MED ORDER — SENNOSIDES-DOCUSATE SODIUM 8.6-50 MG PO TABS: 1.0000 | ORAL_TABLET | Freq: Every evening | ORAL | Status: DC | PRN

## 2014-08-16 MED ORDER — WARFARIN SODIUM 2 MG PO TABS
2.0000 mg | ORAL_TABLET | Freq: Once | ORAL | Status: DC
  Filled 2014-08-16: qty 1

## 2014-08-16 MED ORDER — FUROSEMIDE 40 MG PO TABS
40.0000 mg | ORAL_TABLET | Freq: Two times a day (BID) | ORAL | Status: DC
  Administered 2014-08-17 – 2014-08-18 (×3): 40 mg via ORAL
  Filled 2014-08-16 (×3): qty 1

## 2014-08-16 MED ORDER — CARVEDILOL 12.5 MG PO TABS
12.5000 mg | ORAL_TABLET | Freq: Two times a day (BID) | ORAL | Status: DC
  Administered 2014-08-17 – 2014-08-18 (×3): 12.5 mg via ORAL
  Filled 2014-08-16 (×3): qty 1

## 2014-08-16 MED ORDER — STROKE: EARLY STAGES OF RECOVERY BOOK
Freq: Once | Status: AC
  Administered 2014-08-16: 1
  Filled 2014-08-16: qty 1

## 2014-08-16 MED ORDER — WARFARIN - PHARMACIST DOSING INPATIENT: Freq: Every day | Status: DC

## 2014-08-16 NOTE — Telephone Encounter (Signed)
Patient Name: [name]  DOB: 1933/09/03    Initial Comment Caller states she has a pacemaker and she also has only one kidney. She has been weak and dizzy today.   Nurse Assessment  Nurse: Thad Ranger RN, Denise Date/Time (Eastern Time): 08/16/2014 3:14:29 PM  Confirm and document reason for call. If symptomatic, describe symptoms. ---Caller states she has a pacemaker and she also has only one kidney. She has been weak and dizzy today. She felt as if she would pass out earlier today and had a syncopal feeling yesterday as well. States she was very SOB earlier today but she breathing has improved now. Unable to ck her BP and she states she is not a diabetic. States she takes diuretics, has generalized edema, did not urinate much yesterday and today has blood in her urine. Denies dysuria and does not have fever.  Has the patient traveled out of the country within the last 30 days? ---Not Applicable  Does the patient require triage? ---Yes  Related visit to physician within the last 2 weeks? ---No  Does the PT have any chronic conditions? (i.e. diabetes, asthma, etc.) ---Yes  List chronic conditions. ---Kidney dx w/one kidney removed, CAD w/pacemaker/defib     Guidelines    Guideline Title Affirmed Question Affirmed Notes  Breathing Difficulty [1] MODERATE difficulty breathing (e.g., speaks in phrases, SOB even at rest, pulse 100-120) AND [2] NEW-onset or WORSE than normal Shortness of breath worse today than it has been. She has also had dizziness to the point of feeling syncopal but has not passed out over the past 2 days. Blood in urine and only one kidney. Erring on side of caution and sending pt to the ER.   Final Disposition User   Go to ED Now Thad Ranger, RN, Langley Gauss    Comments  Pt has appt with Nephrologist on Fri. Advised to keep the appt as scheduled. Verb understanding.

## 2014-08-16 NOTE — Progress Notes (Signed)
ANTICOAGULATION CONSULT NOTE - Initial Consult  Pharmacy Consult for [place] Indication: atrial fibrillation  Allergies  Allergen Reactions  . Codeine Nausea And Vomiting  . Morphine And Related Nausea And Vomiting  . Sulfonamide Derivatives Other (See Comments)    REACTION: Dry mouth    Patient Measurements: Height: 5\' 7"  (170.2 cm) Weight: 170 lb (77.111 kg) IBW/kg (Calculated) : 61.6 Heparin Dosing Weight: n/a   Vital Signs: Temp: 97.9 F (36.6 C) (04/12 1920) Temp Source: Oral (04/12 1840) BP: 127/89 mmHg (04/12 2015) Pulse Rate: 59 (04/12 2015)  Labs:  Recent Labs  08/16/14 1737  HGB 10.9*  HCT 34.2*  PLT 184  APTT 47*  LABPROT 23.7*  INR 2.09*  CREATININE 1.49*    Estimated Creatinine Clearance: 33.3 mL/min (by C-G formula based on Cr of 1.49).   Medical History: Past Medical History  Diagnosis Date  . Carotid artery stenosis 09/2007    60-79% bilateral (stable)  . Carotid artery stenosis 10/2008    40-59% R 60-79%   . Chest pain, unspecified   . Atrial fibrillation   . Sinus bradycardia   . Hypotension, unspecified     cardiac cath 2006..nonobstructive CAD 30-40s lesions.Marland KitchenETT 1/09 nondiagnostic due to poor HR response..Right Renal Cancer 2003  . Blood in stool   . Secondary cardiomyopathy, unspecified   . Chronic airway obstruction, not elsewhere classified   . Long term (current) use of anticoagulants   . Dysuria   . Edema   . Encounter for therapeutic drug monitoring   . Other malaise and fatigue   . Hypertrophic obstructive cardiomyopathy   . Urge incontinence   . Other acute sinusitis   . Other and unspecified coagulation defects   . Other screening mammogram   . Pre-operative cardiovascular examination   . Hemorrhage of rectum and anus   . Chronic kidney disease, unspecified   . Special screening for osteoporosis   . Hypertension 05/20/2011  . PONV (postoperative nausea and vomiting)   . Adjustment disorder with anxiety   . Coronary  artery disease     non-obstructive by 2006 cath  . Automatic implantable cardioverter-defibrillator in situ   . High cholesterol   . Anginal pain   . Chronic bronchitis     "get it some; not q year" (11/11/2012)  . Exertional shortness of breath   . History of blood transfusion 04/2011    "after hip OR" (11/11/2012)  . Arthritis     "some in my hands" (11/11/2012)  . Osteoarthritis of right hip   . Renal cancer 06/2001    Right  . Malignant neoplasm of kidney, except pelvis   . Frequent UTI     "get them a couple times/yr" (11/11/2012)  . Chronic diastolic CHF (congestive heart failure) 06/04/2013    Medications:   (Not in a hospital admission)  Assessment: 38 YOF with h/o Afib on Coumadin presented to the ED with bradycardia and respirations in the 10s. Resuming Coumadin on admission. INR therapeutic on admission. CBC stable. Last dose of Coumadin was yesterday.   Goal of Therapy:  INR 2-3 Monitor platelets by anticoagulation protocol: Yes   Plan:  -Resume home dose of Coumadin 2 mg daily except 3 mg on Wednesday -Monitor daily PT/INR  Albertina Parr, PharmD., BCPS Clinical Pharmacist Pager [phone]

## 2014-08-16 NOTE — Consult Note (Signed)
Admission H&P    Chief Complaint: Transient weakness and numbness involving distal right upper extremity.  HPI: [name] is an 81 y.o. female with a history of atrial fibrillation on Coumadin, cardiac pacemaker, hypertension, lipidemia, congestive heart failure, and peripheral vascular disease including bilateral carotid stenosis, presenting with transient numbness and weakness involving a distal right upper extremity which started about 9 AM and began improving at about 5 PM today. She has no previous history of stroke nor TIA. INR was 2.09 today. CT scan of her head showed no acute intracranial abnormality. Her daughter indicated that her speech was slowed, but not definitely dysarthric. No facial asymmetry was noted. She had no symptoms involving left upper extremity nor lower extremities. NIH stroke score at the time of this evaluation was 0.  LSN: 9 AM on 08/16/2014 tPA Given: No: Deficits resolved MRankin: 0  Past Medical History  Diagnosis Date  . Carotid artery stenosis 09/2007    60-79% bilateral (stable)  . Carotid artery stenosis 10/2008    40-59% R 60-79%   . Chest pain, unspecified   . Atrial fibrillation   . Sinus bradycardia   . Hypotension, unspecified     cardiac cath 2006..nonobstructive CAD 30-40s lesions.Marland KitchenETT 1/09 nondiagnostic due to poor HR response..Right Renal Cancer 2003  . Blood in stool   . Secondary cardiomyopathy, unspecified   . Chronic airway obstruction, not elsewhere classified   . Long term (current) use of anticoagulants   . Dysuria   . Edema   . Encounter for therapeutic drug monitoring   . Other malaise and fatigue   . Hypertrophic obstructive cardiomyopathy   . Urge incontinence   . Other acute sinusitis   . Other and unspecified coagulation defects   . Other screening mammogram   . Pre-operative cardiovascular examination   . Hemorrhage of rectum and anus   . Chronic kidney disease, unspecified   . Special screening for osteoporosis    . Hypertension 05/20/2011  . PONV (postoperative nausea and vomiting)   . Adjustment disorder with anxiety   . Coronary artery disease     non-obstructive by 2006 cath  . Automatic implantable cardioverter-defibrillator in situ   . High cholesterol   . Anginal pain   . Chronic bronchitis     "get it some; not q year" (11/11/2012)  . Exertional shortness of breath   . History of blood transfusion 04/2011    "after hip OR" (11/11/2012)  . Arthritis     "some in my hands" (11/11/2012)  . Osteoarthritis of right hip   . Renal cancer 06/2001    Right  . Malignant neoplasm of kidney, except pelvis   . Frequent UTI     "get them a couple times/yr" (11/11/2012)  . Chronic diastolic CHF (congestive heart failure) 06/04/2013    Past Surgical History  Procedure Laterality Date  . Abdominal hysterectomy  1975    for benign causes  . Cardiac catheterization  2006  . Nephrectomy Right 06/2001     S/P RENAL CELL CANCER  . Cataract extraction w/ intraocular lens  implant, bilateral  01/2006-02-2006  . Insert / replace / remove pacemaker  05-01-11    02-28-05-/05-04-10-ICD-MEDTRONIC MAXIMAL DR  . Appendectomy    . Total hip arthroplasty Right 05/03/2011    Procedure: TOTAL HIP ARTHROPLASTY ANTERIOR APPROACH;  Surgeon: Mcarthur Rossetti;  Location: [facility];  Service: Orthopedics;  Laterality: Right;  Removal of Cannulated Screws Right Hip, Right Direct Anterior Hip Replacement  . Laparoscopic  cholecystectomy  11/11/2012  . Bi-ventricular pacemaker upgrade  05/04/2010  . Joint replacement    . Cholecystectomy N/A 11/11/2012    Procedure: LAPAROSCOPIC CHOLECYSTECTOMY WITH INTRAOPERATIVE CHOLANGIOGRAM;  Surgeon: Imogene Burn. Georgette Dover, MD;  Location: [place];  Service: General;  Laterality: N/A;  . Laparoscopic lysis of adhesions N/A 11/11/2012    Procedure: LAPAROSCOPIC LYSIS OF ADHESIONS;  Surgeon: Imogene Burn. Georgette Dover, MD;  Location: [place] [facility];  Service: General;  Laterality: N/A;    Family History  Problem Relation  Age of Onset  . Colon cancer Neg Hx   . Heart failure Mother    Social History:  reports that she quit smoking about 13 years ago. Her smoking use included Cigarettes. She has a 20 pack-year smoking history. She has never used smokeless tobacco. She reports that she does not drink alcohol or use illicit drugs.  Allergies:  Allergies  Allergen Reactions  . Codeine Nausea And Vomiting  . Morphine And Related Nausea And Vomiting  . Sulfonamide Derivatives Other (See Comments)    REACTION: Dry mouth    Medications: Patient's preadmission medications were reviewed by me.  ROS: History obtained from the patient  General ROS: Positive for intermittent generalized weakness with presyncopal symptoms. Psychological ROS: negative for - behavioral disorder, hallucinations, memory difficulties, mood swings or suicidal ideation Ophthalmic ROS: negative for - blurry vision, double vision, eye pain or loss of vision ENT ROS: negative for - epistaxis, nasal discharge, oral lesions, sore throat, tinnitus or vertigo Allergy and Immunology ROS: negative for - hives or itchy/watery eyes Hematological and Lymphatic ROS: negative for - bleeding problems, bruising or swollen lymph nodes Endocrine ROS: negative for - galactorrhea, hair pattern changes, polydipsia/polyuria or temperature intolerance Respiratory ROS: negative for - cough, hemoptysis, shortness of breath or wheezing Cardiovascular ROS: negative for - chest pain, dyspnea on exertion, edema or irregular heartbeat Gastrointestinal ROS: negative for - abdominal pain, diarrhea, hematemesis, nausea/vomiting or stool incontinence Genito-Urinary ROS: negative for - dysuria, hematuria, incontinence or urinary frequency/urgency Musculoskeletal ROS: negative for - joint swelling or muscular weakness Neurological ROS: as noted in HPI Dermatological ROS: negative for rash and skin lesion changes  Physical Examination: Blood pressure 127/89, pulse 59,  temperature 97.9 F (36.6 C), temperature source Oral, resp. rate 19, height $RemoveBe'5\' 7"'[id number]$  (1.702 m), weight 77.111 kg (170 lb), SpO2 100 %.  HEENT-  Normocephalic, no lesions, without obvious abnormality.  Normal external eye and conjunctiva.  Normal TM's bilaterally.  Normal auditory canals and external ears. Normal external nose, mucus membranes and septum.  Normal pharynx. Neck supple with no masses, nodes, nodules or enlargement. Cardiovascular - irregularly irregular rhythm and S1, S2 normal; no murmur Lungs - chest clear, no wheezing, rales, normal symmetric air entry Abdomen - soft, non-tender; bowel sounds normal; no masses,  no organomegaly Extremities - no joint deformities, effusion, or inflammation and no edema  Neurologic Examination: Mental Status: Alert, oriented, thought content appropriate.  Speech fluent without evidence of aphasia. Able to follow commands without difficulty. Cranial Nerves: II-Visual fields were normal. III/IV/VI-Pupils were equal and reacted normally to light. Extraocular movements were full and conjugate.    V/VII-no facial numbness and no facial weakness. VIII-normal. X-normal speech and symmetrical palatal movement. XI: trapezius strength/neck flexion strength normal bilaterally XII-midline tongue extension with normal strength. Motor: 5/5 bilaterally with normal tone and bulk Sensory: Normal throughout. Deep Tendon Reflexes: 1+ and symmetric. Plantars: Flexor bilaterally Cerebellar: Normal finger-to-nose testing. Carotid auscultation: Normal  Results for orders placed or performed during the  hospital encounter of 08/16/14 (from the past 48 hour(s))  Protime-INR     Status: Abnormal   Collection Time: 08/16/14  5:37 PM  Result Value Ref Range   Prothrombin Time 23.7 (H) 11.6 - 15.2 seconds   INR 2.09 (H) 0.00 - 1.49  APTT     Status: Abnormal   Collection Time: 08/16/14  5:37 PM  Result Value Ref Range   aPTT 47 (H) 24 - 37 seconds    Comment:         IF BASELINE aPTT IS ELEVATED, SUGGEST PATIENT RISK ASSESSMENT BE USED TO DETERMINE APPROPRIATE ANTICOAGULANT THERAPY.   CBC     Status: Abnormal   Collection Time: 08/16/14  5:37 PM  Result Value Ref Range   WBC 4.6 4.0 - 10.5 K/uL   RBC 3.98 3.87 - 5.11 MIL/uL   Hemoglobin 10.9 (L) 12.0 - 15.0 g/dL   HCT 34.2 (L) 36.0 - 46.0 %   MCV 85.9 78.0 - 100.0 fL   MCH 27.4 26.0 - 34.0 pg   MCHC 31.9 30.0 - 36.0 g/dL   RDW 16.3 (H) 11.5 - 15.5 %   Platelets 184 150 - 400 K/uL  Differential     Status: None   Collection Time: 08/16/14  5:37 PM  Result Value Ref Range   Neutrophils Relative % 57 43 - 77 %   Neutro Abs 2.6 1.7 - 7.7 K/uL   Lymphocytes Relative 28 12 - 46 %   Lymphs Abs 1.3 0.7 - 4.0 K/uL   Monocytes Relative 12 3 - 12 %   Monocytes Absolute 0.6 0.1 - 1.0 K/uL   Eosinophils Relative 3 0 - 5 %   Eosinophils Absolute 0.1 0.0 - 0.7 K/uL   Basophils Relative 0 0 - 1 %   Basophils Absolute 0.0 0.0 - 0.1 K/uL  Comprehensive metabolic panel     Status: Abnormal   Collection Time: 08/16/14  5:37 PM  Result Value Ref Range   Sodium 139 135 - 145 mmol/L   Potassium 4.1 3.5 - 5.1 mmol/L   Chloride 102 96 - 112 mmol/L   CO2 28 19 - 32 mmol/L   Glucose, Bld 86 70 - 99 mg/dL   BUN 15 6 - 23 mg/dL   Creatinine, Ser 1.49 (H) 0.50 - 1.10 mg/dL   Calcium 8.9 8.4 - 10.5 mg/dL   Total Protein 6.5 6.0 - 8.3 g/dL   Albumin 4.0 3.5 - 5.2 g/dL   AST 24 0 - 37 U/L   ALT 15 0 - 35 U/L   Alkaline Phosphatase 73 39 - 117 U/L   Total Bilirubin 0.6 0.3 - 1.2 mg/dL   GFR calc non Af Amer 32 (L) >90 mL/min   GFR calc Af Amer 38 (L) >90 mL/min    Comment: (NOTE) The eGFR has been calculated using the CKD EPI equation. This calculation has not been validated in all clinical situations. eGFR's persistently <90 mL/min signify possible Chronic Kidney Disease.    Anion gap 9 5 - 15  CBG monitoring, ED     Status: None   Collection Time: 08/16/14  5:42 PM  Result Value Ref Range    Glucose-Capillary 92 70 - 99 mg/dL   Comment 1 Notify RN    Comment 2 Document in Chart   I-stat troponin, ED (not at [facility])     Status: None   Collection Time: 08/16/14  5:49 PM  Result Value Ref Range   Troponin i, poc 0.00  0.00 - 0.08 ng/mL   Comment 3            Comment: Due to the release kinetics of cTnI, a negative result within the first hours of the onset of symptoms does not rule out myocardial infarction with certainty. If myocardial infarction is still suspected, repeat the test at appropriate intervals.   Urinalysis, Routine w reflex microscopic     Status: Abnormal   Collection Time: 08/16/14  5:54 PM  Result Value Ref Range   Color, Urine YELLOW YELLOW   APPearance CLOUDY (A) CLEAR   Specific Gravity, Urine 1.014 1.005 - 1.030   pH 7.0 5.0 - 8.0   Glucose, UA NEGATIVE NEGATIVE mg/dL   Hgb urine dipstick MODERATE (A) NEGATIVE   Bilirubin Urine NEGATIVE NEGATIVE   Ketones, ur NEGATIVE NEGATIVE mg/dL   Protein, ur NEGATIVE NEGATIVE mg/dL   Urobilinogen, UA 1.0 0.0 - 1.0 mg/dL   Nitrite NEGATIVE NEGATIVE   Leukocytes, UA LARGE (A) NEGATIVE  Urine microscopic-add on     Status: Abnormal   Collection Time: 08/16/14  5:54 PM  Result Value Ref Range   Squamous Epithelial / LPF RARE RARE   WBC, UA 7-10 <3 WBC/hpf   RBC / HPF 3-6 <3 RBC/hpf   Bacteria, UA MANY (A) RARE   Ct Head (brain) Wo Contrast  08/16/2014   CLINICAL DATA:  Right arm weakness  EXAM: CT HEAD WITHOUT CONTRAST  TECHNIQUE: Contiguous axial images were obtained from the base of the skull through the vertex without intravenous contrast.  COMPARISON:  None.  FINDINGS: No skull fracture is noted. Paranasal sinuses and mastoid air cells are unremarkable. Atherosclerotic calcifications of carotid siphon. Mild cerebral atrophy. Mild periventricular and patchy subcortical white matter decreased attenuation probable due to chronic small vessel ischemic changes. No definite acute cortical infarction. No mass  lesion is noted on this unenhanced scan.  No intracranial hemorrhage, mass effect or midline shift  IMPRESSION: No acute intracranial abnormality. Mild cerebral atrophy. Mild periventricular white matter decreased attenuation probable due to chronic small vessel ischemic changes. No definite acute cortical infarction.   Electronically Signed   By: Lahoma Crocker M.D.   On: 08/16/2014 19:04    Assessment: 81 y.o. female with multiple risk factors for stroke, presenting with probable left subcortical MCA territory TIA. An acute small vessel infarction cannot be ruled out at this point, however. MRI of the brain cannot be obtained because of patient's cardiac pacemaker.  Stroke Risk Factors - atrial fibrillation, carotid stenosis, hyperlipidemia and hypertension  Plan: 1. HgbA1c, fasting lipid panel 2. PT consult, OT consult, Speech consult 3. Echocardiogram 4. Carotid dopplers 5. Prophylactic therapy-Anticoagulation: Coumadin 6. Risk factor modification 7. Telemetry monitoring  C.R. Nicole Kindred, MD [facility] [phone]  08/16/2014, 8:24 PM

## 2014-08-16 NOTE — Telephone Encounter (Signed)
Agree, ER appropriate if no appts here available

## 2014-08-16 NOTE — ED Notes (Signed)
CBG is 92. Notified nurse Cassie.

## 2014-08-16 NOTE — H&P (Addendum)
Hospitalist Admission History and Physical  Patient name: [name] Medical record number: [id number] Date of birth: 19-Dec-1933 Age: 81 y.o. Gender: female  Primary Care Provider: [name], MD  Chief Complaint: TIA   History of Present Illness:This is a 81 y.o. year old female with significant past medical history of atrial fibrillation on coumadin, carotid artery stenosis, symptomatic bradycardia, renal cell carcinoma s/p nephrectomy, CKD, diastolic CHF  presenting with TIA. Patient reports waking up this morning around 8 AM with generalized weakness. Also went to bed feeling weak. Weakness that progressed to persistent right upper extremity weakness as well as slowed/slurred speech. Patient has daughter called around 4 PM with noticed slowed/slurred speech. Patient's daughter went to evaluate patient home. Patient was reportedly back to baseline at this time. Patient denies any current chest pain though does report having intermittent episodes of chest pains chronically. No fevers or chills. No nausea or vomiting. Has been compliant with Coumadin. INR is at goal predominantly. Presented to the ER afebrile, heart rate in the 30s to 60s but mainly in the 50s, respirations in the tens, blood pressure in the 120s to 160s, satting 100% on room air. CBC and BMP within normal limits are from creatinine 1.49 with baseline creatinine around 1.7. INR 2.09. UA indicative of infection though patient is asymptomatic. EKG shows duly paced rhythm in the 60s. Head CT with no acute intracranial abnormalities with noted mild cerebral atrophy and mild periventricular white matter disease. Patient clinically now back at baseline.  Assessment and Plan: [name] is a 81 y.o. year old female presenting with TIA  Active Problems:   TIA (transient ischemic attack)   1-TIA -proceed down TIA pathway including, carotid dopplers, risk stratification labs. MRI contraindicated given ICD -noted baseline  CAD (40-%59 on R, 60-%79 on L) -cont coumadin -appreciate neuro input   2-Atrial Fibrillation -INR at goal  -cont coumadin -cont pacerone  3- diastolic CHF -2D ECHO 0000000 EF [id number], grade 3 diastolic dysfunction -euvolemic on exam -cont home lasix -repeat 2D ECHO pending  4-CKD -Cr 1.7 chronically  -1.5 today -follow   FEN/GI: heart health diet  Prophylaxis: coumadin  Disposition: pending further evaluation Code Status: Full Code    Patient Active Problem List   Diagnosis Date Noted  . TIA (transient ischemic attack) 08/16/2014  . Weakness 01/14/2014  . Chronic kidney disease (CKD), stage IV (severe) 12/08/2013  . Chronic leg pain 10/20/2013  . Encounter for long-term (current) use of antiplatelets/antithrombotics 06/07/2013  . Chronic diastolic CHF (congestive heart failure) 06/04/2013  . Mitral valve regurgitation 06/04/2013  . Dizziness and giddiness 05/04/2013  . Allergic rhinitis 01/01/2013  . Chronic cholecystitis with calculus 09/15/2012  . Abdominal  pain, other specified site 09/01/2012  . Claudication 01/21/2012  . Hypertension 05/20/2011  . Avascular necrosis of hip 05/03/2011  . OTHER ACUTE SINUSITIS 03/15/2010  . FATIGUE 11/16/2009  . CARCINOMA, RENAL CELL 10/13/2009  . OTHER AND UNSPECIFIED COAGULATION DEFECTS 10/13/2009  . RECTAL BLEEDING 10/13/2009  . HOCM / IHSS 07/14/2009  . EDEMA 07/14/2009  . Hyperlipidemia 04/10/2009  . ADJUSTMENT DISORDER WITH ANXIETY 04/10/2009  . INCONTINENCE, URGE 04/10/2009  . CARDIOMYOPATHY, SECONDARY 10/27/2008  . ATRIAL FIBRILLATION 10/27/2008  . SINUS BRADYCARDIA 10/27/2008  . Carotid artery stenosis 10/27/2008  . COPD 10/27/2008  . Chronic renal insufficiency, stage III (moderate) 10/27/2008  . ICD -Medtronic 10/27/2008   Past Medical History: Past Medical History  Diagnosis Date  . Carotid artery stenosis 09/2007    60-79% bilateral (  stable)  . Carotid artery stenosis 10/2008    40-59% R 60-79%   .  Chest pain, unspecified   . Atrial fibrillation   . Sinus bradycardia   . Hypotension, unspecified     cardiac cath 2006..nonobstructive CAD 30-40s lesions.Marland KitchenETT 1/09 nondiagnostic due to poor HR response..Right Renal Cancer 2003  . Blood in stool   . Secondary cardiomyopathy, unspecified   . Chronic airway obstruction, not elsewhere classified   . Long term (current) use of anticoagulants   . Dysuria   . Edema   . Encounter for therapeutic drug monitoring   . Other malaise and fatigue   . Hypertrophic obstructive cardiomyopathy   . Urge incontinence   . Other acute sinusitis   . Other and unspecified coagulation defects   . Other screening mammogram   . Pre-operative cardiovascular examination   . Hemorrhage of rectum and anus   . Chronic kidney disease, unspecified   . Special screening for osteoporosis   . Hypertension 05/20/2011  . PONV (postoperative nausea and vomiting)   . Adjustment disorder with anxiety   . Coronary artery disease     non-obstructive by 2006 cath  . Automatic implantable cardioverter-defibrillator in situ   . High cholesterol   . Anginal pain   . Chronic bronchitis     "get it some; not q year" (11/11/2012)  . Exertional shortness of breath   . History of blood transfusion 04/2011    "after hip OR" (11/11/2012)  . Arthritis     "some in my hands" (11/11/2012)  . Osteoarthritis of right hip   . Renal cancer 06/2001    Right  . Malignant neoplasm of kidney, except pelvis   . Frequent UTI     "get them a couple times/yr" (11/11/2012)  . Chronic diastolic CHF (congestive heart failure) 06/04/2013    Past Surgical History: Past Surgical History  Procedure Laterality Date  . Abdominal hysterectomy  1975    for benign causes  . Cardiac catheterization  2006  . Nephrectomy Right 06/2001     S/P RENAL CELL CANCER  . Cataract extraction w/ intraocular lens  implant, bilateral  01/2006-02-2006  . Insert / replace / remove pacemaker  05-01-11     02-28-05-/05-04-10-ICD-MEDTRONIC MAXIMAL DR  . Appendectomy    . Total hip arthroplasty Right 05/03/2011    Procedure: TOTAL HIP ARTHROPLASTY ANTERIOR APPROACH;  Surgeon: Mcarthur Rossetti;  Location: [facility];  Service: Orthopedics;  Laterality: Right;  Removal of Cannulated Screws Right Hip, Right Direct Anterior Hip Replacement  . Laparoscopic cholecystectomy  11/11/2012  . Bi-ventricular pacemaker upgrade  05/04/2010  . Joint replacement    . Cholecystectomy N/A 11/11/2012    Procedure: LAPAROSCOPIC CHOLECYSTECTOMY WITH INTRAOPERATIVE CHOLANGIOGRAM;  Surgeon: Imogene Burn. Georgette Dover, MD;  Location: [facility];  Service: General;  Laterality: N/A;  . Laparoscopic lysis of adhesions N/A 11/11/2012    Procedure: LAPAROSCOPIC LYSIS OF ADHESIONS;  Surgeon: Imogene Burn. Georgette Dover, MD;  Location: [facility];  Service: General;  Laterality: N/A;    Social History: History   Social History  . Marital Status: Widowed    Spouse Name: N/A  . Number of Children: N/A  . Years of Education: N/A   Occupational History  . Retired    Social History Main Topics  . Smoking status: Former Smoker -- 0.50 packs/day for 40 years    Types: Cigarettes    Quit date: 05/06/2001  . Smokeless tobacco: Never Used  . Alcohol Use: No  . Drug Use:  No  . Sexual Activity: No   Other Topics Concern  . None   Social History Narrative   WIDOW   CHILDREN AND GRANDCHILDREN ALL LIVE CLOSE BY BUT SHE LIVES ALONE.   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   ALCOHOL USE-NO   RETIRED   FORMER TOBACCO USE.....1/2 PACK X 44 YRS UNTIL 2003    Family History: Family History  Problem Relation Age of Onset  . Colon cancer Neg Hx   . Heart failure Mother     Allergies: Allergies  Allergen Reactions  . Codeine Nausea And Vomiting  . Morphine And Related Nausea And Vomiting  . Sulfonamide Derivatives Other (See Comments)    REACTION: Dry mouth    Current Facility-Administered  Medications  Medication Dose Route Frequency Provider Last Rate Last Dose  .  stroke: mapping our early stages of recovery book   Does not apply Once Deneise Lever, MD      . atorvastatin (LIPITOR) tablet 40 mg  40 mg Oral QPM Deneise Lever, MD      . Derrill Memo ON 08/17/2014] carvedilol (COREG) tablet 12.5 mg  12.5 mg Oral BID WC Deneise Lever, MD      . Derrill Memo ON 08/17/2014] disopyramide (NORPACE) capsule 150 mg  150 mg Oral BID Deneise Lever, MD      . Derrill Memo ON 08/17/2014] furosemide (LASIX) tablet 40 mg  40 mg Oral BID Deneise Lever, MD      . senna-docusate (Senokot-S) tablet 1 tablet  1 tablet Oral QHS PRN Deneise Lever, MD       Current Outpatient Prescriptions  Medication Sig Dispense Refill  . acetaminophen (TYLENOL) 500 MG tablet Take 1,000 mg by mouth every 6 (six) hours as needed for headache.    Marland Kitchen atorvastatin (LIPITOR) 40 MG tablet Take 1 tablet (40 mg total) by mouth every evening. 90 tablet 3  . carvedilol (COREG) 12.5 MG tablet Take 1 tablet (12.5 mg total) by mouth 2 (two) times daily with a meal. 60 tablet 6  . cetirizine (ZYRTEC) 10 MG tablet Take 10 mg by mouth daily.     . cyanocobalamin (,VITAMIN B-12,) 1000 MCG/ML injection Inject 1,000 mcg into the muscle every 30 (thirty) days.     Marland Kitchen disopyramide (NORPACE) 150 MG capsule Take 1 capsule (150 mg total) by mouth 2 (two) times daily. 60 capsule 3  . furosemide (LASIX) 40 MG tablet Take 1 tablet (40 mg total) by mouth 2 (two) times daily. 60 tablet 3  . warfarin (COUMADIN) 1 MG tablet TAKE AS DIRECTED BY COUMADIN CLINIC (Patient taking differently: TAKES ONLY ON WEDNESDAY EACH WEEK BY COUMADIN CLINIC) 30 tablet 2  . warfarin (COUMADIN) 2 MG tablet TAKE ONE TABLET BY MOUTH EVERY DAY (Patient taking differently: TAKE ONE TABLET BY MOUTH EVERY DAY; TAKES ADDT'L 1MG  ON WEDNESDAY ONLY) 30 tablet 2  . amoxicillin (AMOXIL) 875 MG tablet Take 1 tablet (875 mg total) by mouth 2 (two) times daily. 20 tablet 0  . potassium  chloride (K-DUR,KLOR-CON) 10 MEQ tablet Take 1 tablet (10 mEq total) by mouth 2 (two) times daily with breakfast and lunch. 60 tablet 6  . [DISCONTINUED] hydrochlorothiazide (HYDRODIURIL) 25 MG tablet Take 25 mg by mouth daily after breakfast.      Review Of Systems: 12 point ROS negative except as noted above in HPI.  Physical Exam: Filed Vitals:   08/16/14 2015  BP: 127/89  Pulse: 59  Temp:   Resp: 19    General: alert and cooperative HEENT: PERRLA and extra ocular movement intact Heart: irregularly irregular rhythm  Lungs: clear to auscultation, no wheezes or rales and unlabored breathing Abdomen: abdomen is soft without significant tenderness, masses, organomegaly or guarding Extremities: extremities normal, atraumatic, no cyanosis or edema Skin:no rashes Neurology: normal without focal findings  Labs and Imaging: Lab Results  Component Value Date/Time   NA 139 08/16/2014 05:37 PM   NA 138 02/10/2012 12:00 AM   K 4.1 08/16/2014 05:37 PM   CL 102 08/16/2014 05:37 PM   CO2 28 08/16/2014 05:37 PM   BUN 15 08/16/2014 05:37 PM   BUN 22 02/10/2012 12:00 AM   CREATININE 1.49* 08/16/2014 05:37 PM   CREATININE 1.34* 03/19/2011 09:08 AM   GLUCOSE 86 08/16/2014 05:37 PM   GLUCOSE 93 02/10/2012 12:00 AM   Lab Results  Component Value Date   WBC 4.6 08/16/2014   HGB 10.9* 08/16/2014   HCT 34.2* 08/16/2014   MCV 85.9 08/16/2014   PLT 184 08/16/2014    Ct Head (brain) Wo Contrast  08/16/2014   CLINICAL DATA:  Right arm weakness  EXAM: CT HEAD WITHOUT CONTRAST  TECHNIQUE: Contiguous axial images were obtained from the base of the skull through the vertex without intravenous contrast.  COMPARISON:  None.  FINDINGS: No skull fracture is noted. Paranasal sinuses and mastoid air cells are unremarkable. Atherosclerotic calcifications of carotid siphon. Mild cerebral atrophy. Mild periventricular and patchy subcortical white matter decreased attenuation probable due to chronic small  vessel ischemic changes. No definite acute cortical infarction. No mass lesion is noted on this unenhanced scan.  No intracranial hemorrhage, mass effect or midline shift  IMPRESSION: No acute intracranial abnormality. Mild cerebral atrophy. Mild periventricular white matter decreased attenuation probable due to chronic small vessel ischemic changes. No definite acute cortical infarction.   Electronically Signed   By: Lahoma Crocker M.D.   On: 08/16/2014 19:04           Shanda Howells MD  Pager: [phone]

## 2014-08-16 NOTE — Telephone Encounter (Signed)
Spoke to daughter [name] and she stated she was getting her ready to go to ED now

## 2014-08-16 NOTE — ED Notes (Signed)
Pt states that she woke up this morning and experienced hand numbness at 0800. Daughter in the room states that she was on the phone with her mom at 1530 and mom was having a hard time speaking. Pt c/o weakness. Pt has no numbness or neuro deficits at this time. Spoke with Dr Vanita Panda regarding pt. Will order head CT.

## 2014-08-16 NOTE — ED Notes (Signed)
Patient not in room. Patient in CT and will arrive to room assignment when completed.

## 2014-08-17 DIAGNOSIS — R748 Abnormal levels of other serum enzymes: Secondary | ICD-10-CM

## 2014-08-17 DIAGNOSIS — R7989 Other specified abnormal findings of blood chemistry: Secondary | ICD-10-CM | POA: Insufficient documentation

## 2014-08-17 DIAGNOSIS — E785 Hyperlipidemia, unspecified: Secondary | ICD-10-CM

## 2014-08-17 DIAGNOSIS — I10 Essential (primary) hypertension: Secondary | ICD-10-CM | POA: Diagnosis not present

## 2014-08-17 DIAGNOSIS — Z905 Acquired absence of kidney: Secondary | ICD-10-CM | POA: Insufficient documentation

## 2014-08-17 DIAGNOSIS — G451 Carotid artery syndrome (hemispheric): Secondary | ICD-10-CM

## 2014-08-17 DIAGNOSIS — G459 Transient cerebral ischemic attack, unspecified: Secondary | ICD-10-CM | POA: Diagnosis not present

## 2014-08-17 DIAGNOSIS — I6523 Occlusion and stenosis of bilateral carotid arteries: Secondary | ICD-10-CM | POA: Diagnosis not present

## 2014-08-17 DIAGNOSIS — I739 Peripheral vascular disease, unspecified: Secondary | ICD-10-CM | POA: Insufficient documentation

## 2014-08-17 DIAGNOSIS — Z85528 Personal history of other malignant neoplasm of kidney: Secondary | ICD-10-CM | POA: Insufficient documentation

## 2014-08-17 DIAGNOSIS — C649 Malignant neoplasm of unspecified kidney, except renal pelvis: Secondary | ICD-10-CM | POA: Insufficient documentation

## 2014-08-17 DIAGNOSIS — I482 Chronic atrial fibrillation: Secondary | ICD-10-CM | POA: Diagnosis not present

## 2014-08-17 LAB — BASIC METABOLIC PANEL
Anion gap: 10 (ref 5–15)
BUN: 15 mg/dL (ref 6–23)
CO2: 25 mmol/L (ref 19–32)
Calcium: 8.5 mg/dL (ref 8.4–10.5)
Chloride: 105 mmol/L (ref 96–112)
Creatinine, Ser: 1.45 mg/dL — ABNORMAL HIGH (ref 0.50–1.10)
GFR calc Af Amer: 39 mL/min — ABNORMAL LOW (ref >90)
GFR calc non Af Amer: 34 mL/min — ABNORMAL LOW (ref >90)
Glucose, Bld: 98 mg/dL (ref 70–99)
Potassium: 3.7 mmol/L (ref 3.5–5.1)
Sodium: 140 mmol/L (ref 135–145)

## 2014-08-17 LAB — PROTIME-INR
INR: 2.15 — ABNORMAL HIGH (ref 0.00–1.49)
Prothrombin Time: 24.2 seconds — ABNORMAL HIGH (ref 11.6–15.2)

## 2014-08-17 LAB — LIPID PANEL
Cholesterol: 160 mg/dL (ref 0–200)
HDL: 31 mg/dL — ABNORMAL LOW (ref >39)
LDL Cholesterol: 97 mg/dL (ref 0–99)
Total CHOL/HDL Ratio: 5.2 RATIO
Triglycerides: 158 mg/dL — ABNORMAL HIGH (ref <150)
VLDL: 32 mg/dL (ref 0–40)

## 2014-08-17 LAB — CBC
HCT: 32 % — ABNORMAL LOW (ref 36.0–46.0)
Hemoglobin: 10.2 g/dL — ABNORMAL LOW (ref 12.0–15.0)
MCH: 27.5 pg (ref 26.0–34.0)
MCHC: 31.9 g/dL (ref 30.0–36.0)
MCV: 86.3 fL (ref 78.0–100.0)
Platelets: 155 10*3/uL (ref 150–400)
RBC: 3.71 MIL/uL — ABNORMAL LOW (ref 3.87–5.11)
RDW: 16.2 % — ABNORMAL HIGH (ref 11.5–15.5)
WBC: 4.1 10*3/uL (ref 4.0–10.5)

## 2014-08-17 MED ORDER — SODIUM CHLORIDE 0.9 % IV SOLN
INTRAVENOUS | Status: AC
  Administered 2014-08-17: 12:00:00 via INTRAVENOUS

## 2014-08-17 MED ORDER — ATORVASTATIN CALCIUM 80 MG PO TABS
80.0000 mg | ORAL_TABLET | Freq: Every evening | ORAL | Status: DC
  Administered 2014-08-17: 80 mg via ORAL
  Filled 2014-08-17: qty 1

## 2014-08-17 MED ORDER — WARFARIN SODIUM 3 MG PO TABS
3.0000 mg | ORAL_TABLET | Freq: Once | ORAL | Status: AC
  Administered 2014-08-17: 3 mg via ORAL
  Filled 2014-08-17: qty 1

## 2014-08-17 NOTE — Progress Notes (Signed)
STROKE TEAM PROGRESS NOTE   HISTORY [name] is an 81 y.o. female with a history of atrial fibrillation on Coumadin, cardiac pacemaker, hypertension, lipidemia, congestive heart failure, and peripheral vascular disease including bilateral carotid stenosis, presenting with transient numbness and weakness involving a distal right upper extremity which started about 9 AM and began improving at about 5 PM today. She has no previous history of stroke nor TIA. INR was 2.09 today. CT scan of her head showed no acute intracranial abnormality. Her daughter indicated that her speech was slowed, but not definitely dysarthric. No facial asymmetry was noted. She had no symptoms involving left upper extremity nor lower extremities. NIH stroke score at the time of this evaluation was 0.  LSN: 9 AM on 08/16/2014 tPA Given: No: Deficits resolved MRankin: 0   She was admitted to the neuro 4 N for further evaluation and treatment.   SUBJECTIVE (INTERVAL HISTORY) Her daughters are at the bedside.  Overall she feels her condition is resolved. Patient had history of A. fib on Coumadin s/p pacemaker, INR therapeutic. She also had renal cell carcinoma status post of nephrectomy, therefore she also had 1 kidney left. Recently, she was found to have increased creatinine, she was going to see her nephrologist next week regarding the renal insufficiency. Due to this reason, she was not treated for her lower extremity peripheral vascular disease, as she cannot get contrast. She had a recent carotid Doppler in her cardiologist office showing right ICA 40-59% stenosis and left ICA 60-79% stenosis, which has not been treated due to asymptomatic disease.  OBJECTIVE Temp:  [97.8 F (36.6 C)-98.8 F (37.1 C)] 98.1 F (36.7 C) (04/13 0600) Pulse Rate:  [34-75] 61 (04/13 0600) Cardiac Rhythm:  [-] A-V Sequential paced (04/13 0104) Resp:  [13-19] 18 (04/13 0600) BP: (127-173)/(50-89) 149/78 mmHg (04/13 0600) SpO2:  [96  %-100 %] 98 % (04/13 0600) Weight:  [77.111 kg (170 lb)] 77.111 kg (170 lb) (04/12 1738)   Recent Labs Lab 08/16/14 1742  GLUCAP 92    Recent Labs Lab 08/16/14 1737 08/17/14 0449  NA 139 140  K 4.1 3.7  CL 102 105  CO2 28 25  GLUCOSE 86 98  BUN 15 15  CREATININE 1.49* 1.45*  CALCIUM 8.9 8.5    Recent Labs Lab 08/16/14 1737  AST 24  ALT 15  ALKPHOS 73  BILITOT 0.6  PROT 6.5  ALBUMIN 4.0    Recent Labs Lab 08/16/14 1737 08/17/14 0449  WBC 4.6 4.1  NEUTROABS 2.6  --   HGB 10.9* 10.2*  HCT 34.2* 32.0*  MCV 85.9 86.3  PLT 184 155   No results for input(s): CKTOTAL, CKMB, CKMBINDEX, TROPONINI in the last 168 hours.  Recent Labs  08/16/14 1737 08/17/14 0449  LABPROT 23.7* 24.2*  INR 2.09* 2.15*    Recent Labs  08/16/14 1754  COLORURINE YELLOW  LABSPEC 1.014  PHURINE 7.0  GLUCOSEU NEGATIVE  HGBUR MODERATE*  BILIRUBINUR NEGATIVE  KETONESUR NEGATIVE  PROTEINUR NEGATIVE  UROBILINOGEN 1.0  NITRITE NEGATIVE  LEUKOCYTESUR LARGE*       Component Value Date/Time   CHOL 160 08/17/2014 0449   TRIG 158* 08/17/2014 0449   HDL 31* 08/17/2014 0449   CHOLHDL 5.2 08/17/2014 0449   VLDL 32 08/17/2014 0449   LDLCALC 97 08/17/2014 0449   Lab Results  Component Value Date   HGBA1C 5.6 01/14/2014   No results found for: LABOPIA, COCAINSCRNUR, LABBENZ, AMPHETMU, THCU, LABBARB  No results for input(s): [facility]  in the last 168 hours.  I have personally reviewed the radiological images below and agree with the radiology interpretations.  Dg Chest 2 View  08/16/2014    IMPRESSION: No active cardiopulmonary disease.     Ct Head (brain) Wo Contrast  08/16/2014   IMPRESSION: No acute intracranial abnormality. Mild cerebral atrophy. Mild periventricular white matter decreased attenuation probable due to chronic small vessel ischemic changes. No definite acute cortical infarction.     CUS 07/07/14: 40-59% stenosis on the right. 60-79% stenosis on the left.  CUS:  pending  2D Echo: pending  PHYSICAL EXAM HEENT- Normocephalic, no lesions, without obvious abnormality. Normal external eye and conjunctiva. Normal TM's bilaterally. Normal auditory canals and external ears. Normal external nose, mucus membranes and septum. Normal pharynx. Neck supple with no masses, nodes, nodules or enlargement. Cardiovascular - irregularly irregular rhythm and S1, S2 normal; no murmur Lungs - chest clear, no wheezing, rales, normal symmetric air entry Abdomen - soft, non-tender; bowel sounds normal; no masses, no organomegaly Extremities - no joint deformities, effusion, or inflammation and no edema  Neurologic Examination: Mental Status: Alert, oriented, thought content appropriate. Speech fluent without evidence of aphasia. Able to follow commands without difficulty. Cranial Nerves: II-Visual fields were normal. III/IV/VI-Pupils were equal and reacted normally to light. Extraocular movements were full and conjugate.  V/VII-no facial numbness and no facial weakness. VIII-normal. X-normal speech and symmetrical palatal movement. XI: trapezius strength/neck flexion strength normal bilaterally XII-midline tongue extension with normal strength. Motor: 5/5 bilaterally with normal tone and bulk Sensory: Normal throughout. Deep Tendon Reflexes: 1+ and symmetric. Plantars: Flexor bilaterally Cerebellar: Normal finger-to-nose testing.  ASSESSMENT/PLAN [name] is a 81 y.o. female with history of atrial fibrillation on Coumadin s/p pacemaker, hypertension, hyperlipidemia, congestive heart failure, renal cell carcinoma s/p nephrectomy, peripheral vascular disease, and bilateral carotid stenosis presenting with transient numbness and weakness involving a distal right upper extremity . She did not receive IV t-PA due to improved symptoms.   Left brain TIA: Likely secondary to left ICA stenosis, although embolic or small vessel disease cannot be ruled out  due to multiple stroke risk factors. Recommend vascular surgery consultation.  Resultant: symptoms have resolved.  MRI/MRA  Can not do due to pacemaker  CT no acute abnormalities  Carotid Doppler  in March 2006 showed right ICA 40-59%, and left ICA 60-79% stenosis.  2D Echo Pending  LDL 97, not at goal  HgbA1c pending  VTE prophylaxis. On coumadin with therapeutic INR  Diet Heart Room service appropriate?: Yes; Fluid consistency:: Thin  warfarin prior to admission, now on warfarin  Patient counseled to be compliant with her antithrombotic medications  Ongoing aggressive stroke risk factor management  Therapy recommendations:  No therapy required  Disposition: Pending  Carotid stenosis  Recent carotid Doppler showed right ICA 40-59% stenosis and left ICA 60-79% stenosis  Left brain TIA in the setting of left ICA 60-79% stenosis, recommend vascular surgery consult  Not able to do CTA head and neck due to elevated creatinine in the setting of one kidney  Repeat carotid Doppler pending  PVD  Follow-up with vascular surgery in [place]   No acute issues   No procedure in the past as she she cannot get contrast study done due to elevated creatinine in the setting of one kidney   A. Fib  On Coumadin  INR therapeutic  Although not able to completely ruled out, less likely the current event is due to embolic source from A. fib  Hypertension  Home meds:   Coreg and lasix  Stable  Patient counseled to be compliant with her blood pressure medications  Hyperlipidemia  Home meds:  Lipitor resumed in hospital  LDL 97, goal < 70  Continue statin at discharge  Diabetes  HgbA1c Pending, goal < 7.0  Controlled  Other Stroke Risk Factors  Advanced age  Cigarette smoker, quit smoking 13 years ago.    Other Active Problems  History of renal cell carcinoma status post nephrectomy  Elevated creatinine   Hospital day # 1  Patient seen and  discussed with Dr. Erlinda Hong.  Lanelle Bal, PA-C Department of Neurology [facility]  I, the attending vascular neurologist, have personally obtained a history, examined the patient, evaluated laboratory data, individually viewed imaging studies and agree with radiology interpretations. I also obtained additional history from pt's daughters at bedside. I also discussed with Dr. Candiss Norse regarding her care plan. Together with the NP/PA, we formulated the assessment and plan of care which reflects our mutual decision.  I have made any additions or clarifications directly to the above note and agree with the findings and plan as currently documented.   82 year old female with history of HTN, HLD, PVD, A. fib on Coumadin with pacemaker, renal cell carcinoma status post nephrectomy, elevated creatinine was admitted for left brain TIA. She has known left ICA stenosis 60-79%, may explain her left brain TIA. We recommend vascular surgery consultation. However, she is a very complicated cases, as she is on Coumadin for atrial fibrillation, and also has one kidney with elevated creatinine limiting her workup with contrast dye. She still has mildly elevated LDL, and still waiting for 2-D echo and repeat carotid Doppler. No need to repeat CT head as her symptoms resolved.   Rosalin Hawking, MD PhD Stroke Neurology 08/17/2014 5:36 PM    To contact Stroke Continuity provider, please refer to [email or web address] After hours, contact General Neurology

## 2014-08-17 NOTE — Progress Notes (Signed)
Patient Demographics  [name], is a 81 y.o. female, DOB - 04/25/1934, [id number]  Admit date - 08/16/2014   Admitting Physician Deneise Lever, MD  Outpatient Primary MD for the patient is [name], MD  LOS -    Chief Complaint  Patient presents with  . Extremity Weakness        Subjective:   [name] today has, No headache, No chest pain, No abdominal pain - No Nausea, No new weakness tingling or numbness, No Cough - SOB.    Assessment & Plan    1. Right-sided weakness and slurred speech. Likely TIA. All symptoms resolved. CT head unremarkable. MRI cannot be done due to AICD. On Coumadin. Neurology following. LDL above goal increase home dose statin. Echogram and carotid duplex pending. Does have history of carotid artery disease for which outpatient vascular surgery follow-up will be recommended.   2. Dyslipidemia. LDL above goal. Statin was increased.   3. Chronic atrial fibrillation. On amiodarone, beta blocker and Coumadin. INR at goal. Pharmacy monitoring.   4. Renal cell carcinoma with right nephrectomy. Has chronic kidney disease stage III. Creatinine at baseline around 1.5. Void nephrotoxins.   5. History of symptomatic bradycardia - has AICD ?   6.Chr Diastloc CHF - last known EF about 55%. Compensated. Repeat echo pending. On low-dose Lasix continued.   7. Hx of moderate to severe bilateral carotid artery disease. Recommended outpatient vascular surgery follow-up. Continue statin and Coumadin for secondary prevention.     Code Status: Full  Family Communication: daughter  Disposition Plan: TBD   Procedures   CT head, TTE, carotids   Consults  Neuro   Medications  Scheduled Meds: . atorvastatin  40 mg Oral QPM  . carvedilol  12.5 mg  Oral BID WC  . disopyramide  150 mg Oral BID  . furosemide  40 mg Oral BID  . warfarin  2 mg Oral ONCE-1800  . Warfarin - Pharmacist Dosing Inpatient   Does not apply q1800   Continuous Infusions: . sodium chloride     PRN Meds:.senna-docusate  DVT Prophylaxis  Couamdin  Lab Results  Component Value Date   INR 2.15* 08/17/2014   INR 2.09* 08/16/2014   INR 2.3 07/21/2014     Lab Results  Component Value Date   PLT 155 08/17/2014    Antibiotics     Anti-infectives    None          Objective:   Filed Vitals:   08/17/14 0000 08/17/14 0200 08/17/14 0400 08/17/14 0600  BP: 141/52 144/50 140/61 149/78  Pulse: 60 75 69 61  Temp: 97.9 F (36.6 C) 98.2 F (36.8 C) 98.8 F (37.1 C) 98.1 F (36.7 C)  TempSrc: Oral Oral Oral Oral  Resp: 15 14 15 18   Height:      Weight:      SpO2: 99% 96% 97% 98%    Wt Readings from Last 3 Encounters:  08/16/14 77.111 kg (170 lb)  06/09/14 89.585 kg (197 lb 8 oz)  04/14/14 88.905 kg (196 lb)    No intake or output data in the 24 hours ending 08/17/14 1129   Physical Exam  Awake Alert, Oriented X 3, No new F.N deficits, Normal affect [place].AT,PERRAL  Supple Neck,No JVD, No cervical lymphadenopathy appriciated.  Symmetrical Chest wall movement, Good air movement bilaterally, CTAB RRR,No Gallops,Rubs or new Murmurs, No Parasternal Heave +ve B.Sounds, Abd Soft, No tenderness, No organomegaly appriciated, No rebound - guarding or rigidity. No Cyanosis, Clubbing or edema, No new Rash or bruise      Data Review   Micro Results No results found for this or any previous visit (from the past 240 hour(s)).  Radiology Reports Dg Chest 2 View  08/16/2014   CLINICAL DATA:  right hand weakness, question TIA  EXAM: CHEST  2 VIEW  COMPARISON:  01/14/2014  FINDINGS: Cardiomediastinal silhouette is stable. Three leads cardiac pacemaker is unchanged in position. No acute infiltrate or pleural effusion. No pulmonary edema.  IMPRESSION: No  active cardiopulmonary disease.   Electronically Signed   By: Lahoma Crocker M.D.   On: 08/16/2014 21:32   Ct Head (brain) Wo Contrast  08/16/2014   CLINICAL DATA:  Right arm weakness  EXAM: CT HEAD WITHOUT CONTRAST  TECHNIQUE: Contiguous axial images were obtained from the base of the skull through the vertex without intravenous contrast.  COMPARISON:  None.  FINDINGS: No skull fracture is noted. Paranasal sinuses and mastoid air cells are unremarkable. Atherosclerotic calcifications of carotid siphon. Mild cerebral atrophy. Mild periventricular and patchy subcortical white matter decreased attenuation probable due to chronic small vessel ischemic changes. No definite acute cortical infarction. No mass lesion is noted on this unenhanced scan.  No intracranial hemorrhage, mass effect or midline shift  IMPRESSION: No acute intracranial abnormality. Mild cerebral atrophy. Mild periventricular white matter decreased attenuation probable due to chronic small vessel ischemic changes. No definite acute cortical infarction.   Electronically Signed   By: Lahoma Crocker M.D.   On: 08/16/2014 19:04     CBC  Recent Labs Lab 08/16/14 1737 08/17/14 0449  WBC 4.6 4.1  HGB 10.9* 10.2*  HCT 34.2* 32.0*  PLT 184 155  MCV 85.9 86.3  MCH 27.4 27.5  MCHC 31.9 31.9  RDW 16.3* 16.2*  LYMPHSABS 1.3  --   MONOABS 0.6  --   EOSABS 0.1  --   BASOSABS 0.0  --     Chemistries   Recent Labs Lab 08/16/14 1737 08/17/14 0449  NA 139 140  K 4.1 3.7  CL 102 105  CO2 28 25  GLUCOSE 86 98  BUN 15 15  CREATININE 1.49* 1.45*  CALCIUM 8.9 8.5  AST 24  --   ALT 15  --   ALKPHOS 73  --   BILITOT 0.6  --    ------------------------------------------------------------------------------------------------------------------ estimated creatinine clearance is 34.2 mL/min (by C-G formula based on Cr of 1.45). ------------------------------------------------------------------------------------------------------------------ No  results for input(s): HGBA1C in the last 72 hours. ------------------------------------------------------------------------------------------------------------------  Recent Labs  08/17/14 0449  CHOL 160  HDL 31*  LDLCALC 97  TRIG 158*  CHOLHDL 5.2   ------------------------------------------------------------------------------------------------------------------ No results for input(s): TSH, T4TOTAL, T3FREE, THYROIDAB in the last 72 hours.  Invalid input(s): FREET3 ------------------------------------------------------------------------------------------------------------------ No results for input(s): VITAMINB12, FOLATE, FERRITIN, TIBC, IRON, RETICCTPCT in the last 72 hours.  Coagulation profile  Recent Labs Lab 08/16/14 1737 08/17/14 0449  INR 2.09* 2.15*    No results for input(s): DDIMER in the last 72 hours.  Cardiac Enzymes No results for input(s): CKMB, TROPONINI, MYOGLOBIN in the last 168 hours.  Invalid input(s): CK ------------------------------------------------------------------------------------------------------------------ Invalid input(s): POCBNP     Time Spent in minutes  35   Lala Lund K M.D on 08/17/2014 at 11:29 AM  Between 7am to 7pm - Pager - [phone]  After 7pm go to [email or web address] - password [facility]  [facility]   Office  [phone]

## 2014-08-17 NOTE — ED Provider Notes (Signed)
CSN: [id number]     Arrival date & time 08/16/14  1706 History   First MD Initiated Contact with Patient 08/16/14 1858     Chief Complaint  Patient presents with  . Extremity Weakness     (Consider location/radiation/quality/duration/timing/severity/associated sxs/prior Treatment) HPI   This is a 81 yo female with PMH carotid artery stenosis (60-79% on left, 40-59% on left), chronic CP, a fib, HOCM, presenting today with neurologic complaints.  Pt has been feeling "run down" for the last few days.  Today, she awoke at around 0500 feeling tired.  She had no focal weakness, numbness, or tingling at that time.  At around 0900 on day of presentation, she began experiencing RUE tingling, associated with weakness. This was persistent, without improving or worsening throughout the day.  No new meds taken today.  No head trauma.  This resolved at 1700.  She denies CP.  Positive ofr intermittent palpitations, which is her baseline.  Negative for dyspnea.  Pt has had presyncope every few days for the last two weeks as well.    Past Medical History  Diagnosis Date  . Carotid artery stenosis 09/2007    60-79% bilateral (stable)  . Carotid artery stenosis 10/2008    40-59% R 60-79%   . Chest pain, unspecified   . Atrial fibrillation   . Sinus bradycardia   . Hypotension, unspecified     cardiac cath 2006..nonobstructive CAD 30-40s lesions.Marland KitchenETT 1/09 nondiagnostic due to poor HR response..Right Renal Cancer 2003  . Blood in stool   . Secondary cardiomyopathy, unspecified   . Chronic airway obstruction, not elsewhere classified   . Long term (current) use of anticoagulants   . Dysuria   . Edema   . Encounter for therapeutic drug monitoring   . Other malaise and fatigue   . Hypertrophic obstructive cardiomyopathy   . Urge incontinence   . Other acute sinusitis   . Other and unspecified coagulation defects   . Other screening mammogram   . Pre-operative cardiovascular examination   . Hemorrhage of  rectum and anus   . Chronic kidney disease, unspecified   . Special screening for osteoporosis   . Hypertension 05/20/2011  . PONV (postoperative nausea and vomiting)   . Adjustment disorder with anxiety   . Coronary artery disease     non-obstructive by 2006 cath  . Automatic implantable cardioverter-defibrillator in situ   . High cholesterol   . Anginal pain   . Chronic bronchitis     "get it some; not q year" (11/11/2012)  . Exertional shortness of breath   . History of blood transfusion 04/2011    "after hip OR" (11/11/2012)  . Arthritis     "some in my hands" (11/11/2012)  . Osteoarthritis of right hip   . Renal cancer 06/2001    Right  . Malignant neoplasm of kidney, except pelvis   . Frequent UTI     "get them a couple times/yr" (11/11/2012)  . Chronic diastolic CHF (congestive heart failure) 06/04/2013   Past Surgical History  Procedure Laterality Date  . Abdominal hysterectomy  1975    for benign causes  . Cardiac catheterization  2006  . Nephrectomy Right 06/2001     S/P RENAL CELL CANCER  . Cataract extraction w/ intraocular lens  implant, bilateral  01/2006-02-2006  . Insert / replace / remove pacemaker  05-01-11    02-28-05-/05-04-10-ICD-MEDTRONIC MAXIMAL DR  . Appendectomy    . Total hip arthroplasty Right 05/03/2011    Procedure: TOTAL  HIP ARTHROPLASTY ANTERIOR APPROACH;  Surgeon: Mcarthur Rossetti;  Location: [facility];  Service: Orthopedics;  Laterality: Right;  Removal of Cannulated Screws Right Hip, Right Direct Anterior Hip Replacement  . Laparoscopic cholecystectomy  11/11/2012  . Bi-ventricular pacemaker upgrade  05/04/2010  . Joint replacement    . Cholecystectomy N/A 11/11/2012    Procedure: LAPAROSCOPIC CHOLECYSTECTOMY WITH INTRAOPERATIVE CHOLANGIOGRAM;  Surgeon: Imogene Burn. Georgette Dover, MD;  Location: [facility];  Service: General;  Laterality: N/A;  . Laparoscopic lysis of adhesions N/A 11/11/2012    Procedure: LAPAROSCOPIC LYSIS OF ADHESIONS;  Surgeon: Imogene Burn. Georgette Dover,  MD;  Location: [facility];  Service: General;  Laterality: N/A;   Family History  Problem Relation Age of Onset  . Colon cancer Neg Hx   . Heart failure Mother    History  Substance Use Topics  . Smoking status: Former Smoker -- 0.50 packs/day for 40 years    Types: Cigarettes    Quit date: 05/06/2001  . Smokeless tobacco: Never Used  . Alcohol Use: No   OB History    No data available     Review of Systems  Constitutional: Negative for fever and chills.  HENT: Negative for facial swelling.   Eyes: Negative for photophobia and pain.  Respiratory: Negative for cough and shortness of breath.   Cardiovascular: Negative for chest pain and leg swelling.  Gastrointestinal: Negative for nausea, vomiting and abdominal pain.  Genitourinary: Negative for dysuria.  Musculoskeletal: Negative for arthralgias.  Skin: Negative for rash and wound.  Neurological: Positive for weakness.  Hematological: Negative for adenopathy.      Allergies  Codeine; Morphine and related; and Sulfonamide derivatives  Home Medications   Prior to Admission medications   Medication Sig Start Date End Date Taking? Authorizing Provider  acetaminophen (TYLENOL) 500 MG tablet Take 1,000 mg by mouth every 6 (six) hours as needed for headache.   Yes Historical Provider, MD  atorvastatin (LIPITOR) 40 MG tablet Take 1 tablet (40 mg total) by mouth every evening. 06/10/14  Yes Minna Merritts, MD  carvedilol (COREG) 12.5 MG tablet Take 1 tablet (12.5 mg total) by mouth 2 (two) times daily with a meal. 07/06/14  Yes Minna Merritts, MD  cetirizine (ZYRTEC) 10 MG tablet Take 10 mg by mouth daily.    Yes Historical Provider, MD  cyanocobalamin (,VITAMIN B-12,) 1000 MCG/ML injection Inject 1,000 mcg into the muscle every 30 (thirty) days.    Yes Historical Provider, MD  disopyramide (NORPACE) 150 MG capsule Take 1 capsule (150 mg total) by mouth 2 (two) times daily. 06/07/14  Yes Minna Merritts, MD  furosemide (LASIX) 40 MG  tablet Take 1 tablet (40 mg total) by mouth 2 (two) times daily. 04/18/14 04/18/15 Yes Deboraha Sprang, MD  warfarin (COUMADIN) 1 MG tablet TAKE AS DIRECTED BY COUMADIN CLINIC Patient taking differently: TAKES ONLY ON WEDNESDAY EACH WEEK BY COUMADIN CLINIC 05/17/13  Yes Lucille Passy, MD  warfarin (COUMADIN) 2 MG tablet TAKE ONE TABLET BY MOUTH EVERY DAY Patient taking differently: TAKE ONE TABLET BY MOUTH EVERY DAY; TAKES ADDT'L 1MG  ON WEDNESDAY ONLY 05/17/14  Yes Lucille Passy, MD  amoxicillin (AMOXIL) 875 MG tablet Take 1 tablet (875 mg total) by mouth 2 (two) times daily. 04/14/14   Jearld Fenton, NP  potassium chloride (K-DUR,KLOR-CON) 10 MEQ tablet Take 1 tablet (10 mEq total) by mouth 2 (two) times daily with breakfast and lunch. 05/12/13   Evans Lance, MD   BP 173/62  mmHg  Pulse 65  Temp(Src) 98.1 F (36.7 C) (Oral)  Resp 18  Ht 5\' 7"  (1.702 m)  Wt 170 lb (77.111 kg)  BMI 26.62 kg/m2  SpO2 97% Physical Exam  Constitutional: She is oriented to person, place, and time. She appears well-developed and well-nourished. No distress.  HENT:  Head: Normocephalic and atraumatic.  Mouth/Throat: No oropharyngeal exudate.  Eyes: Conjunctivae are normal. Pupils are equal, round, and reactive to light. No scleral icterus.  Neck: Normal range of motion. No tracheal deviation present. No thyromegaly present.  Cardiovascular: Normal rate, regular rhythm and normal heart sounds.  Exam reveals no gallop and no friction rub.   No murmur heard. Pulmonary/Chest: Effort normal and breath sounds normal. No stridor. No respiratory distress. She has no wheezes. She has no rales. She exhibits no tenderness.  Abdominal: Soft. She exhibits no distension and no mass. There is no tenderness. There is no rebound and no guarding.  Musculoskeletal: Normal range of motion. She exhibits no edema.  Neurological: She is alert and oriented to person, place, and time. She has normal strength. No cranial nerve deficit or  sensory deficit. Coordination and gait normal. GCS eye subscore is 4. GCS verbal subscore is 5. GCS motor subscore is 6.  Reflex Scores:      Patellar reflexes are 2+ on the right side and 2+ on the left side. Skin: Skin is warm and dry. She is not diaphoretic.    ED Course  Procedures (including critical care time) Labs Review Labs Reviewed  PROTIME-INR - Abnormal; Notable for the following:    Prothrombin Time 23.7 (*)    INR 2.09 (*)    All other components within normal limits  APTT - Abnormal; Notable for the following:    aPTT 47 (*)    All other components within normal limits  CBC - Abnormal; Notable for the following:    Hemoglobin 10.9 (*)    HCT 34.2 (*)    RDW 16.3 (*)    All other components within normal limits  COMPREHENSIVE METABOLIC PANEL - Abnormal; Notable for the following:    Creatinine, Ser 1.49 (*)    GFR calc non Af Amer 32 (*)    GFR calc Af Amer 38 (*)    All other components within normal limits  URINALYSIS, ROUTINE W REFLEX MICROSCOPIC - Abnormal; Notable for the following:    APPearance CLOUDY (*)    Hgb urine dipstick MODERATE (*)    Leukocytes, UA LARGE (*)    All other components within normal limits  URINE MICROSCOPIC-ADD ON - Abnormal; Notable for the following:    Bacteria, UA MANY (*)    All other components within normal limits  DIFFERENTIAL  HEMOGLOBIN A1C  LIPID PANEL  PROTIME-INR  CBC  BASIC METABOLIC PANEL  CBG MONITORING, ED  Randolm Idol, ED    Imaging Review Dg Chest 2 View  08/16/2014   CLINICAL DATA:  right hand weakness, question TIA  EXAM: CHEST  2 VIEW  COMPARISON:  01/14/2014  FINDINGS: Cardiomediastinal silhouette is stable. Three leads cardiac pacemaker is unchanged in position. No acute infiltrate or pleural effusion. No pulmonary edema.  IMPRESSION: No active cardiopulmonary disease.   Electronically Signed   By: Lahoma Crocker M.D.   On: 08/16/2014 21:32   Ct Head (brain) Wo Contrast  08/16/2014   CLINICAL DATA:   Right arm weakness  EXAM: CT HEAD WITHOUT CONTRAST  TECHNIQUE: Contiguous axial images were obtained from the base of the skull  through the vertex without intravenous contrast.  COMPARISON:  None.  FINDINGS: No skull fracture is noted. Paranasal sinuses and mastoid air cells are unremarkable. Atherosclerotic calcifications of carotid siphon. Mild cerebral atrophy. Mild periventricular and patchy subcortical white matter decreased attenuation probable due to chronic small vessel ischemic changes. No definite acute cortical infarction. No mass lesion is noted on this unenhanced scan.  No intracranial hemorrhage, mass effect or midline shift  IMPRESSION: No acute intracranial abnormality. Mild cerebral atrophy. Mild periventricular white matter decreased attenuation probable due to chronic small vessel ischemic changes. No definite acute cortical infarction.   Electronically Signed   By: Lahoma Crocker M.D.   On: 08/16/2014 19:04   MDM   Final diagnoses:  TIA (transient ischemic attack)    This is a 81 yo female with PMH carotid artery stenosis (60-79% on left, 40-59% on left), chronic CP, a fib, HOCM, presenting today with neurologic complaints.  Pt has been feeling "run down" for the last few days.  Today, she awoke at around 0500 feeling tired.  She had no focal weakness, numbness, or tingling at that time.  At around 0900 on day of presentation, she began experiencing RUE tingling, associated with weakness. This was persistent, without improving or worsening throughout the day.  No new meds taken today.  No head trauma.  This resolved at 1700.  She denies CP.  Positive ofr intermittent palpitations, which is her baseline.  Negative for dyspnea.  Pt has had presyncope every few days for the last two weeks as well.    Pt is completely asymptomatic at this time, states she actually feels quite well.  Her exam is WNL.  CT scan reveals NAICA.  Blood work without acute abnormalities.  Pt warrant inpatient  management for TIA workup.  Additionally, this patient is anticoagulated, with a history of cardiac dysrhythmias, and has had multiple episodes of presyncope, making her high risk from that regard as well.  Will admit to the hospitalist service with neurology consulted.  Will monitor closely until admitted.  Pt is being admitted in stable condition.    I have discussed case and care has been guided by my attending physician, Dr. Reather Converse.   Doy Hutching, MD 08/17/14 [id number]  Elnora Morrison, MD 08/19/14 1106

## 2014-08-17 NOTE — Progress Notes (Signed)
UR completed 

## 2014-08-17 NOTE — Progress Notes (Signed)
Patient describes a chest pain starting around  0230 until 0315 which was located around 2 cm distal to left sternal boarder approximately 4th and 5th intercostal space, she did not alert me to the occurrence but told me after the pain had subsided. She has also described the fluttering feeling within her chest as well. Will continue to monitor.

## 2014-08-17 NOTE — Progress Notes (Signed)
ANTICOAGULATION CONSULT NOTE - Follow Up Consult  Pharmacy Consult for Warfarin Indication: atrial fibrillation  Allergies  Allergen Reactions  . Codeine Nausea And Vomiting  . Morphine And Related Nausea And Vomiting  . Sulfonamide Derivatives Other (See Comments)    REACTION: Dry mouth    Patient Measurements: Height: 5\' 7"  (170.2 cm) Weight: 170 lb (77.111 kg) IBW/kg (Calculated) : 61.6  Vital Signs: Temp: 98.8 F (37.1 C) (04/13 1000) Temp Source: Oral (04/13 1000) BP: 138/80 mmHg (04/13 1000) Pulse Rate: 72 (04/13 1000)  Labs:  Recent Labs  08/16/14 1737 08/17/14 0449  HGB 10.9* 10.2*  HCT 34.2* 32.0*  PLT 184 155  APTT 47*  --   LABPROT 23.7* 24.2*  INR 2.09* 2.15*  CREATININE 1.49* 1.45*    Estimated Creatinine Clearance: 34.2 mL/min (by C-G formula based on Cr of 1.45).   Assessment: 63 YOF who continues on warfarin for hx Afib. The patient's warfarin dose was missed on 4/12 due to an order entry error - however the INR remains therapeutic this morning (INR 2.15 << 2.09, goal of 2-3). CBC stable - no bleeding noted. Will give a slightly higher dose this evening.  Goal of Therapy:  INR 2-3 Monitor platelets by anticoagulation protocol: Yes   Plan:  1. Warfarin 3 mg x 1 dose at 1800 today 2. Will continue to monitor for any signs/symptoms of bleeding and will follow up with PT/INR in the a.m.   Alycia Rossetti, PharmD, BCPS Clinical Pharmacist Pager: [phone] 08/17/2014 2:37 PM

## 2014-08-17 NOTE — Progress Notes (Signed)
Patient arrived around 2200 with no complaints or deficits she is resting comfortably will continue to monitor.

## 2014-08-17 NOTE — Evaluation (Signed)
Physical Therapy Evaluation Patient Details Name: [name] MRN: [id number] DOB: Apr 19, 1934 Today's Date: 08/17/2014   History of Present Illness  Pt s/p TIA on 08/16/2014.  PMH includes A-fib, pacemaker, CKD, diastolic CHF, carotid artery stenosis, chronic leg pain, avascular necrosis of the hip   Clinical Impression  Pt is s/p TIA, resulting in the deficits listed below (see PT Problem List). Pt demonstrated supervision level function with bed mobility, transfers and ambulation today, with good potential to get to independent/modified independent. Pt currently lives at home alone, but daughter and grandson are able to help intermittently.   Recommend return home once modified independent level function achieved and once medically cleared.  Pt will benefit from skilled PT to increase their independence and safety with mobility to allow discharge to the venue listed below.      Follow Up Recommendations Supervision - Intermittent;No PT follow up    Equipment Recommendations  None recommended by PT    Recommendations for Other Services       Precautions / Restrictions Precautions Precautions: Fall Restrictions Weight Bearing Restrictions: No      Mobility  Bed Mobility Overal bed mobility: Needs Assistance Bed Mobility: Supine to Sit     Supine to sit: Supervision     General bed mobility comments: No increased time, however, used railing and HOB elevated due to urgency to use restroom  Transfers Overall transfer level: Needs assistance Equipment used: None Transfers: Sit to/from Stand Sit to Stand: Supervision         General transfer comment: Able to demonstrate safe transferring with min v/c's, supervision only for safety  Ambulation/Gait Ambulation/Gait assistance: Min guard Ambulation Distance (Feet): 250 Feet Assistive device: None Gait Pattern/deviations: Step-through pattern;Wide base of support Gait velocity: Slow Gait velocity interpretation:  Below normal speed for age/gender General Gait Details: Pt ambulated with a wide BOS, and weight on the lateral edge of her feet, used wall and other items during walking for brief stability, reported B LE pain was worse with walking and demonstrated fatigue and need for seated rest break after about 75 feet  Stairs Stairs: Yes Stairs assistance: Min guard Stair Management: Two rails Number of Stairs: 4 General stair comments: Min v/c's for safe stair navigation and use of rails  Wheelchair Mobility    Modified Rankin (Stroke Patients Only) Modified Rankin (Stroke Patients Only) Pre-Morbid Rankin Score: No symptoms Modified Rankin: No significant disability     Balance Overall balance assessment: Needs assistance         Standing balance support: During functional activity;Single extremity supported (Uses wall and other items for suppport with ambulation) Standing balance-Leahy Scale: Fair Standing balance comment: Pt demonstrated need for wide base of support with ambulation, and occasionally would reach to wall for support, able to navigate stairs with BUE support (has 2 rails at home to enter house)                             Pertinent Vitals/Pain Pain Assessment: 0-10 Pain Score: 4  Pain Location: Chest pain and bilateral leg pain below knees Pain Descriptors / Indicators: Discomfort (Has had chest discomfort intermittently for past year or so) Pain Intervention(s): Monitored during session    Home Living Family/patient expects to be discharged to:: Private residence Living Arrangements: Alone Available Help at Discharge: Family;Available PRN/intermittently Type of Home: House Home Access: Stairs to enter Entrance Stairs-Rails: Can reach both Entrance Stairs-Number of Steps: 4 Home Layout:  One level Home Equipment: None Additional Comments: Daughter and grandson able to help intermittently    Prior Function Level of Independence: Independent                Hand Dominance        Extremity/Trunk Assessment   Upper Extremity Assessment: Overall WFL for tasks assessed (Grossly 5/5 B UE strength (elbow flex/ext/finger flex))           Lower Extremity Assessment: Overall WFL for tasks assessed (Grossly 5/5 B LE strength)      Cervical / Trunk Assessment: Normal  Communication   Communication: No difficulties  Cognition Arousal/Alertness: Awake/alert Behavior During Therapy: WFL for tasks assessed/performed Overall Cognitive Status: Within Functional Limits for tasks assessed                      General Comments General comments (skin integrity, edema, etc.): Strength and sensation (dermatomes/myotomes) WNL bilaterally, no complaints of chest pain with ambulation, just fatigue and LE pain bilaterally below knees    Exercises        Assessment/Plan    PT Assessment Patient needs continued PT services  PT Diagnosis Difficulty walking;Abnormality of gait;Generalized weakness;Acute pain   PT Problem List Decreased activity tolerance;Decreased balance;Decreased mobility;Decreased coordination;Pain  PT Treatment Interventions Gait training;Stair training;Functional mobility training;Therapeutic activities;Therapeutic exercise;Balance training;Neuromuscular re-education   PT Goals (Current goals can be found in the Care Plan section) Acute Rehab PT Goals Patient Stated Goal: Return home PT Goal Formulation: With patient Time For Goal Achievement: 08/31/14 Potential to Achieve Goals: Good    Frequency Min 3X/week   Barriers to discharge        Co-evaluation               End of Session Equipment Utilized During Treatment: Gait belt Activity Tolerance: Patient tolerated treatment well;Patient limited by fatigue Patient left: in bed Nurse Communication: Mobility status    Functional Assessment Tool Used: clincial judgement Functional Limitation: Mobility: Walking and moving around Mobility:  Walking and Moving Around Current Status [id number]): At least 1 percent but less than 20 percent impaired, limited or restricted Mobility: Walking and Moving Around Goal Status [phone]): 0 percent impaired, limited or restricted    Time: [id number] PT Time Calculation (min) (ACUTE ONLY): 27 min   Charges:   PT Evaluation $Initial PT Evaluation Tier I: 1 Procedure PT Treatments $Gait Training: 8-22 mins   PT G Codes:   PT G-Codes **NOT FOR INPATIENT CLASS** Functional Assessment Tool Used: clincial judgement Functional Limitation: Mobility: Walking and moving around Mobility: Walking and Moving Around Current Status [id number]): At least 1 percent but less than 20 percent impaired, limited or restricted Mobility: Walking and Moving Around Goal Status [phone]): 0 percent impaired, limited or restricted    [name] 08/17/2014, 11:22 AM  Lucas Mallow, SPT (student physical therapist) Office phone: [phone]

## 2014-08-17 NOTE — Progress Notes (Signed)
Speech Language Pathology  Patient Details Name: [name] MRN: [id number] DOB: October 28, 1933 Today's Date: 08/17/2014 Time:  -     Brief screen. No assessment warranted. Pt and family in agreement, no ST needs.   Orbie Pyo [place].[employer] [phone]

## 2014-08-17 NOTE — Evaluation (Signed)
Occupational Therapy Evaluation Patient Details Name: [name] MRN: [id number] DOB: June 20, 1933 Today's Date: 08/17/2014    History of Present Illness Pt s/p TIA on 08/16/2014.  PMH includes A-fib, pacemaker, CKD, diastolic CHF, carotid artery stenosis, chronic leg pain, avascular necrosis of the hip    Clinical Impression   Pt admitted with the above diagnoses and presents with below problem list. Pt will benefit from continued OT to address the below listed deficits and maximize independence with BADLs prior to d/c home. PTA pt was independent with ADLs. Pt currently at supervision level for LB ADLs and functional mobility due to balance. OT to continue to follow acutely to address balance deficits as they relate to ADLs.     Follow Up Recommendations  Supervision - Intermittent;No OT follow up    Equipment Recommendations  None recommended by OT    Recommendations for Other Services       Precautions / Restrictions Precautions Precautions: Fall Restrictions Weight Bearing Restrictions: No      Mobility Bed Mobility Overal bed mobility: Needs Assistance Bed Mobility: Supine to Sit;Sit to Supine     Supine to sit: Supervision;HOB elevated Sit to supine: Supervision   General bed mobility comments: used bed rails, increased time due to high need to urinate  Transfers Overall transfer level: Needs assistance Equipment used: None Transfers: Sit to/from Stand Sit to Stand: Supervision         General transfer comment: no physical assist    Balance Overall balance assessment: Needs assistance Sitting-balance support: No upper extremity supported;Feet supported Sitting balance-Leahy Scale: Good     Standing balance support: No upper extremity supported;During functional activity Standing balance-Leahy Scale: Fair Standing balance comment: used external support: grab bars, IV pole at times                            ADL Overall ADL's :  Needs assistance/impaired Eating/Feeding: Set up;Sitting   Grooming: Set up;Sitting;Standing   Upper Body Bathing: Set up;Sitting   Lower Body Bathing: Supervison/ safety;Sit to/from stand   Upper Body Dressing : Set up;Sitting   Lower Body Dressing: Supervision/safety;Sit to/from stand   Toilet Transfer: Supervision/safety;Ambulation;Comfort height toilet;Grab bars   Toileting- Clothing Manipulation and Hygiene: Supervision/safety;Sit to/from stand   Tub/ Shower Transfer: Supervision/safety;Ambulation;Shower seat   Functional mobility during ADLs: Supervision/safety General ADL Comments: Pt ambulated to bathroom and completed toilet transfer to comfort height toilet using grab bars at supervision level. Pt amulating at increased speed due to urgency with some mild unsteadiness noted. At end of session, discussed fall prevention tip, safety with ADLs while balance impaired.      Vision Additional Comments: Pt reports bluriness started about a week ago, recommended to pt f/u with opthamologist   Perception     Praxis      Pertinent Vitals/Pain Pain Assessment: 0-10 Pain Score: 4  Pain Location: Chest pain and bilateral leg pain below knees Pain Descriptors / Indicators: Discomfort (Has had chest discomfort intermittently for past year or so) Pain Intervention(s): Monitored during session     Hand Dominance     Extremity/Trunk Assessment Upper Extremity Assessment Upper Extremity Assessment: Overall WFL for tasks assessed   Lower Extremity Assessment Lower Extremity Assessment: Defer to PT evaluation   Cervical / Trunk Assessment Cervical / Trunk Assessment: Normal   Communication Communication Communication: No difficulties   Cognition Arousal/Alertness: Awake/alert Behavior During Therapy: WFL for tasks assessed/performed Overall Cognitive Status: Within Functional Limits  for tasks assessed                     General Comments   No c/o pain.     Exercises       Shoulder Instructions      Home Living Family/patient expects to be discharged to:: Private residence Living Arrangements: Alone Available Help at Discharge: Family;Available PRN/intermittently Type of Home: House Home Access: Stairs to enter [employer] of Steps: 4 Entrance Stairs-Rails: Can reach both Home Layout: One level     Bathroom Shower/Tub: Teacher, early years/pre: Standard     Home Equipment: Bedside commode   Additional Comments: Daughter and grandson able to help intermittently      Prior Functioning/Environment Level of Independence: Independent             OT Diagnosis: Other (comment) (impaired balance)   OT Problem List: Impaired balance (sitting and/or standing);Decreased knowledge of use of DME or AE;Decreased knowledge of precautions;Impaired vision/perception;Other (comment) (intermittent blurriness close)   OT Treatment/Interventions: Self-care/ADL training;DME and/or AE instruction;Therapeutic activities;Patient/family education;Visual/perceptual remediation/compensation;Balance training    OT Goals(Current goals can be found in the care plan section) Acute Rehab OT Goals Patient Stated Goal: Return home OT Goal Formulation: With patient/family Time For Goal Achievement: 08/24/14 Potential to Achieve Goals: Good ADL Goals Pt Will Perform Grooming: Independently;standing Pt Will Perform Lower Body Dressing: Independently;sit to/from stand;with adaptive equipment Pt Will Transfer to Toilet: Independently;ambulating;regular height toilet;grab bars Pt Will Perform Toileting - Clothing Manipulation and hygiene: Independently;sit to/from stand  OT Frequency: Min 2X/week   Barriers to D/C:            Co-evaluation              End of Session    Activity Tolerance: Patient tolerated treatment well Patient left: in bed;with family/visitor present;with call bell/phone within reach;with bed alarm  set   Time: [id number] OT Time Calculation (min): 18 min Charges:  OT General Charges $OT Visit: 1 Procedure OT Evaluation $Initial OT Evaluation Tier I: 1 Procedure G-Codes: OT G-codes **NOT FOR INPATIENT CLASS** Functional Assessment Tool Used: clinical judgment Functional Limitation: Self care Self Care Current Status [id number]): At least 1 percent but less than 20 percent impaired, limited or restricted Self Care Goal Status [id number]): 0 percent impaired, limited or restricted  Hortencia Pilar 08/17/2014, 12:56 PM

## 2014-08-18 DIAGNOSIS — I6529 Occlusion and stenosis of unspecified carotid artery: Secondary | ICD-10-CM | POA: Diagnosis not present

## 2014-08-18 DIAGNOSIS — I10 Essential (primary) hypertension: Secondary | ICD-10-CM | POA: Diagnosis not present

## 2014-08-18 DIAGNOSIS — G459 Transient cerebral ischemic attack, unspecified: Secondary | ICD-10-CM | POA: Diagnosis not present

## 2014-08-18 DIAGNOSIS — R748 Abnormal levels of other serum enzymes: Secondary | ICD-10-CM | POA: Diagnosis not present

## 2014-08-18 DIAGNOSIS — I6523 Occlusion and stenosis of bilateral carotid arteries: Secondary | ICD-10-CM | POA: Diagnosis not present

## 2014-08-18 DIAGNOSIS — I482 Chronic atrial fibrillation: Secondary | ICD-10-CM | POA: Diagnosis not present

## 2014-08-18 LAB — PROTIME-INR
INR: 1.9 — ABNORMAL HIGH (ref 0.00–1.49)
Prothrombin Time: 21.9 seconds — ABNORMAL HIGH (ref 11.6–15.2)

## 2014-08-18 LAB — HEMOGLOBIN A1C
Hgb A1c MFr Bld: 6.1 % — ABNORMAL HIGH (ref 4.8–5.6)
Mean Plasma Glucose: 128 mg/dL

## 2014-08-18 MED ORDER — WARFARIN SODIUM 3 MG PO TABS
3.0000 mg | ORAL_TABLET | Freq: Once | ORAL | Status: DC
  Filled 2014-08-18: qty 1

## 2014-08-18 MED ORDER — ATORVASTATIN CALCIUM 80 MG PO TABS: 80.0000 mg | ORAL_TABLET | Freq: Every evening | ORAL | Status: DC

## 2014-08-18 NOTE — Progress Notes (Signed)
ANTICOAGULATION CONSULT NOTE - Follow Up Consult  Pharmacy Consult for Warfarin Indication: atrial fibrillation  Allergies  Allergen Reactions  . Codeine Nausea And Vomiting  . Morphine And Related Nausea And Vomiting  . Sulfonamide Derivatives Other (See Comments)    REACTION: Dry mouth    Patient Measurements: Height: 5\' 7"  (170.2 cm) Weight: 170 lb (77.111 kg) IBW/kg (Calculated) : 61.6  Vital Signs: Temp: 98.4 F (36.9 C) (04/14 0605) Temp Source: Oral (04/14 0605) BP: 152/74 mmHg (04/14 0605) Pulse Rate: 68 (04/14 0605)  Labs:  Recent Labs  08/16/14 1737 08/17/14 0449 08/18/14 0618  HGB 10.9* 10.2*  --   HCT 34.2* 32.0*  --   PLT 184 155  --   APTT 47*  --   --   LABPROT 23.7* 24.2* 21.9*  INR 2.09* 2.15* 1.90*  CREATININE 1.49* 1.45*  --     Estimated Creatinine Clearance: 34.2 mL/min (by C-G formula based on Cr of 1.45).   Assessment: 49 YOF who continues on warfarin for hx Afib. The patient's warfarin dose was missed on 4/12 due to an order entry error and was resumed on 4/13. INR today is slightly SUBtherapeutic (INR 1.9 << 2.15, goal of 2-3). No CBC today - stable from 4/13 labs - no overt s/sx of bleeding noted.   Noted plans for discharge today - the patient was re-educated on warfarin. If discharged home today would recommend 3 mg this evening (the patient has 2 mg tablets at home) and then to resume the patient's home dose of 2 mg daily EXCEPT for 3 mg on Wednesdays only.  Goal of Therapy:  INR 2-3 Monitor platelets by anticoagulation protocol: Yes   Plan:  1. Warfarin 3 mg x 1 dose at 1800 today 2. Will continue to monitor for any signs/symptoms of bleeding and will follow up with PT/INR in the a.m.   Alycia Rossetti, PharmD, BCPS Clinical Pharmacist Pager: [phone] 08/18/2014 10:42 AM

## 2014-08-18 NOTE — Discharge Instructions (Signed)
Follow with Primary MD [name], MD in 7 days   Get CBC, CMP, INR , 2 view Chest X ray checked  by Primary MD next visit.    Activity: As tolerated with Full fall precautions use walker/cane & assistance as needed   Disposition Home     Diet: Heart Healthy    For Heart failure patients - Check your Weight same time everyday, if you gain over 2 pounds, or you develop in leg swelling, experience more shortness of breath or chest pain, call your Primary MD immediately. Follow Cardiac Low Salt Diet and 1.5 lit/day fluid restriction.   On your next visit with your primary care physician please Get Medicines reviewed and adjusted.   Please request your Prim.MD to go over all Hospital Tests and Procedure/Radiological results at the follow up, please get all Hospital records sent to your Prim MD by signing hospital release before you go home.   If you experience worsening of your admission symptoms, develop shortness of breath, life threatening emergency, suicidal or homicidal thoughts you must seek medical attention immediately by calling 911 or calling your MD immediately  if symptoms less severe.  You Must read complete instructions/literature along with all the possible adverse reactions/side effects for all the Medicines you take and that have been prescribed to you. Take any new Medicines after you have completely understood and accpet all the possible adverse reactions/side effects.   Do not drive, operating heavy machinery, perform activities at heights, swimming or participation in water activities or provide baby sitting services if your were admitted for syncope or siezures until you have seen by Primary MD or a Neurologist and advised to do so again.  Do not drive when taking Pain medications.    Do not take more than prescribed Pain, Sleep and Anxiety Medications  Special Instructions: If you have smoked or chewed Tobacco  in the last 2 yrs please stop smoking, stop any  regular Alcohol  and or any Recreational drug use.  Wear Seat belts while driving.   Please note  You were cared for by a hospitalist during your hospital stay. If you have any questions about your discharge medications or the care you received while you were in the hospital after you are discharged, you can call the unit and asked to speak with the hospitalist on call if the hospitalist that took care of you is not available. Once you are discharged, your primary care physician will handle any further medical issues. Please note that NO REFILLS for any discharge medications will be authorized once you are discharged, as it is imperative that you return to your primary care physician (or establish a relationship with a primary care physician if you do not have one) for your aftercare needs so that they can reassess your need for medications and monitor your lab values.

## 2014-08-18 NOTE — Discharge Summary (Addendum)
[name], is a 81 y.o. female  DOB Sep 24, 1933  MRN [id number].  Admission date:  08/16/2014  Admitting Physician  Deneise Lever, MD  Discharge Date:  08/18/2014   Primary MD  [name], MD  Recommendations for primary care physician for things to follow:    Monitor secondary risk factors for stroke. Must follow with vascular surgery and neurology closely.   Echo showing incidental valvular abnormalities which are nonacute along with possible underlying pulmonary hypertension. Requests PCP to please scheduled one-time outpatient cardiology and pulmonary follow-up.   Admission Diagnosis  TIA (transient ischemic attack) [G45.9]   Discharge Diagnosis  TIA (transient ischemic attack) [G45.9]     Active Problems:   Atrial fibrillation   Carotid artery stenosis   Hypertension   TIA (transient ischemic attack)   PVD (peripheral vascular disease)   Elevated serum creatinine   Renal cell carcinoma   S/p nephrectomy      Past Medical History  Diagnosis Date  . Carotid artery stenosis 09/2007    60-79% bilateral (stable)  . Carotid artery stenosis 10/2008    40-59% R 60-79%   . Chest pain, unspecified   . Atrial fibrillation   . Sinus bradycardia   . Hypotension, unspecified     cardiac cath 2006..nonobstructive CAD 30-40s lesions.Marland KitchenETT 1/09 nondiagnostic due to poor HR response..Right Renal Cancer 2003  . Blood in stool   . Secondary cardiomyopathy, unspecified   . Chronic airway obstruction, not elsewhere classified   . Long term (current) use of anticoagulants   . Dysuria   . Edema   . Encounter for therapeutic drug monitoring   . Other malaise and fatigue   . Hypertrophic obstructive cardiomyopathy   . Urge incontinence   . Other acute sinusitis   . Other and unspecified coagulation defects    . Other screening mammogram   . Pre-operative cardiovascular examination   . Hemorrhage of rectum and anus   . Chronic kidney disease, unspecified   . Special screening for osteoporosis   . Hypertension 05/20/2011  . PONV (postoperative nausea and vomiting)   . Adjustment disorder with anxiety   . Coronary artery disease     non-obstructive by 2006 cath  . Automatic implantable cardioverter-defibrillator in situ   . High cholesterol   . Anginal pain   . Chronic bronchitis     "get it some; not q year" (11/11/2012)  . Exertional shortness of breath   . History of blood transfusion 04/2011    "after hip OR" (11/11/2012)  . Arthritis     "some in my hands" (11/11/2012)  . Osteoarthritis of right hip   . Renal cancer 06/2001    Right  . Malignant neoplasm of kidney, except pelvis   . Frequent UTI     "get them a couple times/yr" (11/11/2012)  . Chronic diastolic CHF (congestive heart failure) 06/04/2013    Past Surgical History  Procedure Laterality Date  . Abdominal hysterectomy  1975    for benign causes  . Cardiac  catheterization  2006  . Nephrectomy Right 06/2001     S/P RENAL CELL CANCER  . Cataract extraction w/ intraocular lens  implant, bilateral  01/2006-02-2006  . Insert / replace / remove pacemaker  05-01-11    02-28-05-/05-04-10-ICD-MEDTRONIC MAXIMAL DR  . Appendectomy    . Total hip arthroplasty Right 05/03/2011    Procedure: TOTAL HIP ARTHROPLASTY ANTERIOR APPROACH;  Surgeon: Mcarthur Rossetti;  Location: [facility];  Service: Orthopedics;  Laterality: Right;  Removal of Cannulated Screws Right Hip, Right Direct Anterior Hip Replacement  . Laparoscopic cholecystectomy  11/11/2012  . Bi-ventricular pacemaker upgrade  05/04/2010  . Joint replacement    . Cholecystectomy N/A 11/11/2012    Procedure: LAPAROSCOPIC CHOLECYSTECTOMY WITH INTRAOPERATIVE CHOLANGIOGRAM;  Surgeon: Imogene Burn. Georgette Dover, MD;  Location: [facility];  Service: General;  Laterality: N/A;  . Laparoscopic lysis of  adhesions N/A 11/11/2012    Procedure: LAPAROSCOPIC LYSIS OF ADHESIONS;  Surgeon: Imogene Burn. Tsuei, MD;  Location: [facility];  Service: General;  Laterality: N/A;       History of present illness and  Hospital Course:     Kindly see H&P for history of present illness and admission details, please review complete Labs, Consult reports and Test reports for all details in brief  HPI  from the history and physical done on the day of admission  This is a 81 y.o. year old female with significant past medical history of atrial fibrillation on coumadin, carotid artery stenosis, symptomatic bradycardia, renal cell carcinoma s/p nephrectomy, CKD, diastolic CHF presenting with TIA. Patient reports waking up this morning around 8 AM with generalized weakness. Also went to bed feeling weak. Weakness that progressed to persistent right upper extremity weakness as well as slowed/slurred speech. Patient has daughter called around 4 PM with noticed slowed/slurred speech. Patient's daughter went to evaluate patient home. Patient was reportedly back to baseline at this time. Patient denies any current chest pain though does report having intermittent episodes of chest pains chronically. No fevers or chills. No nausea or vomiting. Has been compliant with Coumadin. INR is at goal predominantly. Presented to the ER afebrile, heart rate in the 30s to 60s but mainly in the 50s, respirations in the tens, blood pressure in the 120s to 160s, satting 100% on room air. CBC and BMP within normal limits are from creatinine 1.49 with baseline creatinine around 1.7. INR 2.09. UA indicative of infection though patient is asymptomatic. EKG shows duly paced rhythm in the 60s. Head CT with no acute intracranial abnormalities with noted mild cerebral atrophy and mild periventricular white matter disease. Patient clinically now back at baseline.  Hospital Course   1. Right-sided weakness and slurred speech. Likely TIA. All symptoms resolved.  CT head unremarkable. MRI cannot be done due to AICD. On Coumadin. Neurology following. LDL above goal increase home dose statin. Echogram and carotid duplex non acute. Does have history of carotid artery disease for which outpatient vascular surgery follow-up will be recommended.   2. Dyslipidemia. LDL above goal. Statin was increased.   3. Chronic atrial fibrillation. On amiodarone, beta blocker and Coumadin. INR at goal. Pharmacy monitoring.   4. Renal cell carcinoma with right nephrectomy. Has chronic kidney disease stage III. Creatinine at baseline around 1.5. Void nephrotoxins.   5. History of symptomatic bradycardia - has AICD ?   6.Chr Diastloc CHF - last known EF about 65% . Compensated.  On low-dose Lasix continued.   7. Hx of moderate to severe bilateral carotid artery disease. Recommended  outpatient vascular surgery follow-up. Continue statin and Coumadin for secondary prevention    8. Echo showing incidental valvular abnormalities which are nonacute along with possible underlying pulmonary hypertension. Requests PCP to please schedule one-time outpatient cardiology and pulmonary follow-up.    Discharge Condition:  Stable   Follow UP  Follow-up Information    Follow up with [name], MD. Schedule an appointment as soon as possible for a visit in 1 week.   Specialty:  Family Medicine   Contact information:   [place] [address] [phone]       Follow up with Lenor Coffin, MD. Schedule an appointment as soon as possible for a visit in 1 week.   Specialty:  Neurology   Why:  TIA/CVA   Contact information:   [address] [place] [address] [phone]       Follow up with EARLY, TODD, MD. Schedule an appointment as soon as possible for a visit in 1 week.   Specialty:  Vascular Surgery   Why:  Carotid artery stenosis   Contact information:   [place] [address] [phone]          Discharge Instructions  and  Discharge Medications      Discharge Instructions    Diet - low sodium heart healthy    Complete by:  As directed      Discharge instructions    Complete by:  As directed   Follow with Primary MD [name], MD in 7 days   Get CBC, CMP, INR 2 view Chest X ray checked  by Primary MD next visit.    Activity: As tolerated with Full fall precautions use walker/cane & assistance as needed   Disposition Home     Diet: Heart Healthy    For Heart failure patients - Check your Weight same time everyday, if you gain over 2 pounds, or you develop in leg swelling, experience more shortness of breath or chest pain, call your Primary MD immediately. Follow Cardiac Low Salt Diet and 1.5 lit/day fluid restriction.   On your next visit with your primary care physician please Get Medicines reviewed and adjusted.   Please request your Prim.MD to go over all Hospital Tests and Procedure/Radiological results at the follow up, please get all Hospital records sent to your Prim MD by signing hospital release before you go home.   If you experience worsening of your admission symptoms, develop shortness of breath, life threatening emergency, suicidal or homicidal thoughts you must seek medical attention immediately by calling 911 or calling your MD immediately  if symptoms less severe.  You Must read complete instructions/literature along with all the possible adverse reactions/side effects for all the Medicines you take and that have been prescribed to you. Take any new Medicines after you have completely understood and accpet all the possible adverse reactions/side effects.   Do not drive, operating heavy machinery, perform activities at heights, swimming or participation in water activities or provide baby sitting services if your were admitted for syncope or siezures until you have seen by Primary MD or a Neurologist and advised to do so again.  Do not drive when  taking Pain medications.    Do not take more than prescribed Pain, Sleep and Anxiety Medications  Special Instructions: If you have smoked or chewed Tobacco  in the last 2 yrs please stop smoking, stop any regular Alcohol  and or any Recreational drug use.  Wear Seat belts  while driving.   Please note  You were cared for by a hospitalist during your hospital stay. If you have any questions about your discharge medications or the care you received while you were in the hospital after you are discharged, you can call the unit and asked to speak with the hospitalist on call if the hospitalist that took care of you is not available. Once you are discharged, your primary care physician will handle any further medical issues. Please note that NO REFILLS for any discharge medications will be authorized once you are discharged, as it is imperative that you return to your primary care physician (or establish a relationship with a primary care physician if you do not have one) for your aftercare needs so that they can reassess your need for medications and monitor your lab values.     Increase activity slowly    Complete by:  As directed             Medication List    STOP taking these medications        amoxicillin 875 MG tablet  Commonly known as:  AMOXIL      TAKE these medications        acetaminophen 500 MG tablet  Commonly known as:  TYLENOL  Take 1,000 mg by mouth every 6 (six) hours as needed for headache.     atorvastatin 80 MG tablet  Commonly known as:  LIPITOR  Take 1 tablet (80 mg total) by mouth every evening.     carvedilol 12.5 MG tablet  Commonly known as:  COREG  Take 1 tablet (12.5 mg total) by mouth 2 (two) times daily with a meal.     cetirizine 10 MG tablet  Commonly known as:  ZYRTEC  Take 10 mg by mouth daily.     cyanocobalamin 1000 MCG/ML injection  Commonly known as:  (VITAMIN B-12)  Inject 1,000 mcg into the muscle every 30 (thirty) days.      disopyramide 150 MG capsule  Commonly known as:  NORPACE  Take 1 capsule (150 mg total) by mouth 2 (two) times daily.     furosemide 40 MG tablet  Commonly known as:  LASIX  Take 1 tablet (40 mg total) by mouth 2 (two) times daily.     potassium chloride 10 MEQ tablet  Commonly known as:  K-DUR,KLOR-CON  Take 1 tablet (10 mEq total) by mouth 2 (two) times daily with breakfast and lunch.     warfarin 1 MG tablet  Commonly known as:  COUMADIN  TAKE AS DIRECTED BY COUMADIN CLINIC     warfarin 2 MG tablet  Commonly known as:  COUMADIN  TAKE ONE TABLET BY MOUTH EVERY DAY          Diet and Activity recommendation: See Discharge Instructions above   Consults obtained - Neuro   Major procedures and Radiology Reports - PLEASE review detailed and final reports for all details, in brief -    TEE  Left ventricle: The cavity size was normal. There was moderate concentric hypertrophy. Systolic function was vigorous. The estimated ejection fraction was in the range of 65% to 70%. Wall motion was normal; there were no regional wall motion abnormalities. The study is not technically sufficient to allow evaluation of LV diastolic function. - Aortic valve: Trileaflet; mildly thickened, moderately calcified leaflets. Transvalvular velocity was minimally increased. There was no stenosis. There was trivial regurgitation. - Mitral valve: Calcified annulus. Mildly thickened leaflets . Mild prolapse, involving  the anterior leaflet. There was moderate regurgitation. - Left atrium: The atrium was mildly dilated. - Right ventricle: The cavity size was mildly dilated. Wall thickness was normal. - Right atrium: The atrium was moderately dilated. - Tricuspid valve: There was severe regurgitation. - Pulmonary arteries: Systolic pressure was severely increased. PA peak pressure: 73 mm Hg (S).  Carotid Dopplers completed.   Preliminary report: There is 1-39% right ICA  stenosis. There is 40-59% ICA stenosis. Bilateral vertebral artery flow is antegrade.   Dg Chest 2 View  08/16/2014   CLINICAL DATA:  right hand weakness, question TIA  EXAM: CHEST  2 VIEW  COMPARISON:  01/14/2014  FINDINGS: Cardiomediastinal silhouette is stable. Three leads cardiac pacemaker is unchanged in position. No acute infiltrate or pleural effusion. No pulmonary edema.  IMPRESSION: No active cardiopulmonary disease.   Electronically Signed   By: Lahoma Crocker M.D.   On: 08/16/2014 21:32   Ct Head (brain) Wo Contrast  08/16/2014   CLINICAL DATA:  Right arm weakness  EXAM: CT HEAD WITHOUT CONTRAST  TECHNIQUE: Contiguous axial images were obtained from the base of the skull through the vertex without intravenous contrast.  COMPARISON:  None.  FINDINGS: No skull fracture is noted. Paranasal sinuses and mastoid air cells are unremarkable. Atherosclerotic calcifications of carotid siphon. Mild cerebral atrophy. Mild periventricular and patchy subcortical white matter decreased attenuation probable due to chronic small vessel ischemic changes. No definite acute cortical infarction. No mass lesion is noted on this unenhanced scan.  No intracranial hemorrhage, mass effect or midline shift  IMPRESSION: No acute intracranial abnormality. Mild cerebral atrophy. Mild periventricular white matter decreased attenuation probable due to chronic small vessel ischemic changes. No definite acute cortical infarction.   Electronically Signed   By: Lahoma Crocker M.D.   On: 08/16/2014 19:04    Micro Results      No results found for this or any previous visit (from the past 240 hour(s)).     Today   Subjective:   [name] today has no headache,no chest abdominal pain,no new weakness tingling or numbness, feels much better wants to go home today   Objective:   Blood pressure 152/74, pulse 68, temperature 98.4 F (36.9 C), temperature source Oral, resp. rate 19, height 5\' 7"  (1.702 m), weight 77.111 kg  (170 lb), SpO2 99 %.   Intake/Output Summary (Last 24 hours) at 08/18/14 1029 Last data filed at 08/18/14 0600  Gross per 24 hour  Intake   1200 ml  Output   1250 ml  Net    -50 ml    Exam Awake Alert, Oriented x 3, No new F.N deficits, Normal affect [place].AT,PERRAL Supple Neck,No JVD, No cervical lymphadenopathy appriciated.  Symmetrical Chest wall movement, Good air movement bilaterally, CTAB RRR,No Gallops,Rubs or new Murmurs, No Parasternal Heave +ve B.Sounds, Abd Soft, Non tender, No organomegaly appriciated, No rebound -guarding or rigidity. No Cyanosis, Clubbing or edema, No new Rash or bruise  Data Review   CBC w Diff: Lab Results  Component Value Date   WBC 4.1 08/17/2014   WBC 4.3 02/10/2012   HGB 10.2* 08/17/2014   HCT 32.0* 08/17/2014   PLT 155 08/17/2014   LYMPHOPCT 28 08/16/2014   MONOPCT 12 08/16/2014   EOSPCT 3 08/16/2014   BASOPCT 0 08/16/2014    CMP: Lab Results  Component Value Date   NA 140 08/17/2014   NA 138 02/10/2012   K 3.7 08/17/2014   CL 105 08/17/2014   CO2 25 08/17/2014  BUN 15 08/17/2014   BUN 22 02/10/2012   CREATININE 1.45* 08/17/2014   CREATININE 1.34* 03/19/2011   PROT 6.5 08/16/2014   ALBUMIN 4.0 08/16/2014   BILITOT 0.6 08/16/2014   ALKPHOS 73 08/16/2014   AST 24 08/16/2014   ALT 15 08/16/2014  . Lab Results  Component Value Date   HGBA1C 6.1* 08/17/2014    Lab Results  Component Value Date   CHOL 160 08/17/2014   HDL 31* 08/17/2014   LDLCALC 97 08/17/2014   LDLDIRECT 150.3 04/10/2009   TRIG 158* 08/17/2014   CHOLHDL 5.2 08/17/2014   Lab Results  Component Value Date   INR 1.90* 08/18/2014   INR 2.15* 08/17/2014   INR 2.09* 08/16/2014     Total Time in preparing paper work, data evaluation and todays exam - 35 minutes  Thurnell Lose M.D on 08/18/2014 at 10:29 AM  [facility]   Office  [phone]

## 2014-08-18 NOTE — Progress Notes (Signed)
Pt discharging at this time home with her daughter taking all personal belongings. Alert, verbal with no noted distress. She denies pain or discomfort.  IV discontinued, applied dry dressing. Discharge instructions provided, pt will pick up prescription at pharmacy in [place]. Attempted to schedule appts for pt on discharge summary but pt and daughter insisted that they wanted to schedule appts. No therapy or assistive device needed for home. Pt verbal understanding of all discharge instructions provided with handout.

## 2014-08-18 NOTE — Progress Notes (Signed)
VASCULAR LAB PRELIMINARY  PRELIMINARY  PRELIMINARY  PRELIMINARY  Carotid Dopplers completed.    Preliminary report:  There is 1-39% right ICA stenosis. There is 40-59% ICA stenosis.  Bilateral vertebral artery flow is antegrade.   Tywon Niday, RVT 08/18/2014, 9:52 AM

## 2014-08-18 NOTE — Progress Notes (Signed)
Physical Therapy Treatment Patient Details Name: [name] MRN: [id number] DOB: 22-Sep-1933 Today's Date: 08/18/2014    History of Present Illness Pt s/p TIA on 08/16/2014.  PMH includes A-fib, pacemaker, CKD, diastolic CHF, carotid artery stenosis, chronic leg pain, avascular necrosis of the hip     PT Comments    Patient agreeable to ensure safety with ambulation prior to DC. Patient reeducated on BE FAST and able to teach back. Patient plans to DC back home today and her daughter will stay through the night and assist patient as needed. Patient safe to D/C from a mobility standpoint based on progression towards goals set on PT eval.    Follow Up Recommendations        Equipment Recommendations       Recommendations for Other Services       Precautions / Restrictions Precautions Precautions: Fall    Mobility  Bed Mobility                  Transfers Overall transfer level: Independent                  Ambulation/Gait Ambulation/Gait assistance: Supervision Ambulation Distance (Feet): 400 Feet   Gait Pattern/deviations: Step-through pattern;Decreased stride length;Drifts right/left     General Gait Details: Patient drifts with dynamic challenges (head turns, speed changes, turns) but no LOB and overall safe with    Stairs    Patient completed 3 steps with L hand rails with supervision assist          Wheelchair Mobility    Modified Rankin (Stroke Patients Only)       Balance                                    Cognition Arousal/Alertness: Awake/alert Behavior During Therapy: WFL for tasks assessed/performed Overall Cognitive Status: Within Functional Limits for tasks assessed                      Exercises      General Comments        Pertinent Vitals/Pain Pain Assessment: No/denies pain    Home Living                      Prior Function            PT Goals (current goals can now  be found in the care plan section)      Frequency       PT Plan      Co-evaluation             End of Session           Time:  -     Charges:                       G Codes:      Jacqualyn Posey 08/18/2014, 1:58 PM 08/18/2014 Jacqualyn Posey PTA [phone] pager [phone] office

## 2014-08-18 NOTE — Progress Notes (Signed)
STROKE TEAM PROGRESS NOTE   HISTORY [name] is an 81 y.o. female with a history of atrial fibrillation on Coumadin, cardiac pacemaker, hypertension, lipidemia, congestive heart failure, and peripheral vascular disease including bilateral carotid stenosis, presenting with transient numbness and weakness involving a distal right upper extremity which started about 9 AM 08/16/2014 ([facility]) and began improving at about 5 PM today. She has no previous history of stroke nor TIA. INR was 2.09 today. CT scan of her head showed no acute intracranial abnormality. Her daughter indicated that her speech was slowed, but not definitely dysarthric. No facial asymmetry was noted. She had no symptoms involving left upper extremity nor lower extremities. NIH stroke score at the time of this evaluation was 0. tPA was not given as deficits resolved. She was admitted to the neuro 4 N for further evaluation and treatment.   SUBJECTIVE (INTERVAL HISTORY) Her daughter is at the bedside.  She is dressed and ready for discharge. Her CUS during admission showed only 1-39% right ICA and 40-59% left ICA stenosis, significant change from one month ago she had in her cardiology office. She will need outpt follow up with vascular surgery. She can not have CTA neck this time due to high Cre and one kidney.   OBJECTIVE Temp:  [98.1 F (36.7 C)-98.4 F (36.9 C)] 98.4 F (36.9 C) (04/14 0605) Pulse Rate:  [60-68] 68 (04/14 0605) Cardiac Rhythm:  [-] Ventricular paced (04/13 2000) Resp:  [18-19] 19 (04/14 0605) BP: (140-161)/(53-83) 152/74 mmHg (04/14 0605) SpO2:  [98 %-100 %] 99 % (04/14 0605)   Recent Labs Lab 08/16/14 1742  GLUCAP 92    Recent Labs Lab 08/16/14 1737 08/17/14 0449  NA 139 140  K 4.1 3.7  CL 102 105  CO2 28 25  GLUCOSE 86 98  BUN 15 15  CREATININE 1.49* 1.45*  CALCIUM 8.9 8.5    Recent Labs Lab 08/16/14 1737  AST 24  ALT 15  ALKPHOS 73  BILITOT 0.6  PROT 6.5  ALBUMIN 4.0     Recent Labs Lab 08/16/14 1737 08/17/14 0449  WBC 4.6 4.1  NEUTROABS 2.6  --   HGB 10.9* 10.2*  HCT 34.2* 32.0*  MCV 85.9 86.3  PLT 184 155   No results for input(s): CKTOTAL, CKMB, CKMBINDEX, TROPONINI in the last 168 hours.  Recent Labs  08/16/14 1737 08/17/14 0449 08/18/14 0618  LABPROT 23.7* 24.2* 21.9*  INR 2.09* 2.15* 1.90*    Recent Labs  08/16/14 1754  COLORURINE YELLOW  LABSPEC 1.014  PHURINE 7.0  GLUCOSEU NEGATIVE  HGBUR MODERATE*  BILIRUBINUR NEGATIVE  KETONESUR NEGATIVE  PROTEINUR NEGATIVE  UROBILINOGEN 1.0  NITRITE NEGATIVE  LEUKOCYTESUR LARGE*       Component Value Date/Time   CHOL 160 08/17/2014 0449   TRIG 158* 08/17/2014 0449   HDL 31* 08/17/2014 0449   CHOLHDL 5.2 08/17/2014 0449   VLDL 32 08/17/2014 0449   LDLCALC 97 08/17/2014 0449   Lab Results  Component Value Date   HGBA1C 6.1* 08/17/2014   No results found for: LABOPIA, COCAINSCRNUR, LABBENZ, AMPHETMU, THCU, LABBARB  No results for input(s): ETH in the last 168 hours.  I have personally reviewed the radiological images below and agree with the radiology interpretations.  Dg Chest 2 View 08/16/2014    IMPRESSION: No active cardiopulmonary disease.     Ct Head (brain) Wo Contrast 08/16/2014   IMPRESSION: No acute intracranial abnormality. Mild cerebral atrophy. Mild periventricular white matter decreased attenuation probable due to  chronic small vessel ischemic changes. No definite acute cortical infarction.     Carotid Ultrasound 07/07/14 40-59% stenosis on the right. 60-79% stenosis on the left. Carotid Ultrasound 08/18/14 There is 1-39% right ICA stenosis. There is 40-59% ICA stenosis. Bilateral vertebral artery flow is antegrade.   2D Echo  - Left ventricle: The cavity size was normal. There was moderateconcentric hypertrophy. Systolic function was vigorous. Theestimated ejection fraction was in the range of 65% to 70%. Wallmotion was normal; there were no regional wall  motion abnormalities. The study is not technically sufficient to allowevaluation of LV diastolic function. - Aortic valve: Trileaflet; mildly thickened, moderately calcifiedleaflets. Transvalvular velocity was minimally increased. Therewas no stenosis. There was trivial regurgitation. - Mitral valve: Calcified annulus. Mildly thickened leaflets . Mildprolapse, involving the anterior leaflet. There was moderateregurgitation. - Left atrium: The atrium was mildly dilated. - Right ventricle: The cavity size was mildly dilated. Wallthickness was normal. - Right atrium: The atrium was moderately dilated. - Tricuspid valve: There was severe regurgitation. - Pulmonary arteries: Systolic pressure was severely increased. PApeak pressure: 73 mm Hg (S).   PHYSICAL EXAM HEENT- Normocephalic, no lesions, without obvious abnormality. Normal external eye and conjunctiva. Normal TM's bilaterally. Normal auditory canals and external ears. Normal external nose, mucus membranes and septum. Normal pharynx. Neck supple with no masses, nodes, nodules or enlargement. Cardiovascular - irregularly irregular rhythm and S1, S2 normal; no murmur Lungs - chest clear, no wheezing, rales, normal symmetric air entry Abdomen - soft, non-tender; bowel sounds normal; no masses, no organomegaly Extremities - no joint deformities, effusion, or inflammation and no edema  Neurologic Examination: Mental Status: Alert, oriented, thought content appropriate. Speech fluent without evidence of aphasia. Able to follow commands without difficulty. Cranial Nerves: II-Visual fields were normal. III/IV/VI-Pupils were equal and reacted normally to light. Extraocular movements were full and conjugate.  V/VII-no facial numbness and no facial weakness. VIII-normal. X-normal speech and symmetrical palatal movement. XI: trapezius strength/neck flexion strength normal bilaterally XII-midline tongue extension with normal  strength. Motor: 5/5 bilaterally with normal tone and bulk Sensory: Normal throughout. Deep Tendon Reflexes: 1+ and symmetric. Plantars: Flexor bilaterally Cerebellar: Normal finger-to-nose testing.   ASSESSMENT/PLAN [name] is a 81 y.o. female with history of atrial fibrillation on Coumadin s/p pacemaker, hypertension, hyperlipidemia, congestive heart failure, renal cell carcinoma s/p nephrectomy, peripheral vascular disease, and bilateral carotid stenosis presenting with transient numbness and weakness involving a distal right upper extremity . She did not receive IV t-PA due to improved symptoms.   Left brain TIA: Likely secondary to left ICA stenosis, although embolic or small vessel disease cannot be ruled out due to multiple stroke risk factors. Recommend vascular surgery follow up outpt.  Resultant: symptoms have resolved.  MRI/MRA  Can not do due to pacemaker  CT no acute abnormalities  Carotid Doppler  in March 2006 showed right ICA 40-59%, and left ICA 60-79% stenosis, however, this admission here right ICA 1-39% and L 40-59% stenosis. For OP follow up with vascular surgery.  2D Echo No source of embolus   LDL 97, not at goal  HgbA1c 6.1, at goal  VTE prophylaxis. On coumadin with therapeutic INR Diet Heart Room service appropriate?: Yes; Fluid consistency:: Thin Diet - low sodium heart healthy  warfarin prior to admission, now on warfarin  Patient counseled to be compliant with her antithrombotic medications  Ongoing aggressive stroke risk factor management  Therapy recommendations:  No therapy required  Disposition: home  Carotid stenosis  Recent carotid  Doppler showed right ICA 40-59% stenosis and left ICA 60-79% stenosis; carotid dopplers this admission show 1-39% stenosis right ICA and 40-59% on the left  Given Left brain TIA in the setting of left ICA stenosis, plan OP follow up with vascular surgery   Not able to do CTA head and neck due  to elevated creatinine in the setting of one kidney  PVD  Follow-up with vascular surgery in [place]   No acute issues   No procedure in the past as she she cannot get contrast study done due to elevated creatinine in the setting of one kidney   A. Fib  On Coumadin  INR therapeutic  Although not able to completely ruled out, less likely the current event is due to embolic source from A. fib  Hypertension  Home meds:   Coreg and lasix  Stable  Patient counseled to be compliant with her blood pressure medications  Hyperlipidemia  Home meds:  Lipitor resumed in hospital  LDL 97, goal < 70  Continue statin at discharge  Diabetes  HgbA1c 6.1, goal < 7.0  Controlled  Other Stroke Risk Factors  Advanced age  Cigarette smoker, quit smoking 13 years ago.  Other Active Problems  History of renal cell carcinoma status post nephrectomy  Elevated creatinine  [facility] See Amion for Pager information 08/18/2014 12:43 PM   I, the attending vascular neurologist, have personally obtained a history, examined the patient, evaluated laboratory data, individually viewed imaging studies and agree with radiology interpretations. I also discussed with pt's daughter at bedside. Together with the NP/PA, we formulated the assessment and plan of care which reflects our mutual decision.  I have made any additions or clarifications directly to the above note and agree with the findings and plan as currently documented.   81 year old female with history of HTN, HLD, PVD, A. fib on Coumadin with pacemaker, renal cell carcinoma status post nephrectomy, elevated creatinine was admitted for left brain TIA. She has known left ICA stenosis 60-79%, may explain her left brain TIA. However, her CUS this admission showed left 40-59% stenosis, will recommend vascular surgery outpt consultation. However, she can not have CTA neck due to one kidney with elevated  creatinine. She still has mildly elevated LDL. No need to repeat CT head as her symptoms resolved.   Neurology will sign off. Please call with questions. Pt will follow up with Dr. Erlinda Hong at [facility] in about 2 months. Thanks for the consult.  Rosalin Hawking, MD PhD Stroke Neurology 08/18/2014 4:37 PM       To contact Stroke Continuity provider, please refer to [email or web address] After hours, contact General Neurology

## 2014-08-18 NOTE — Progress Notes (Signed)
  Echocardiogram 2D Echocardiogram has been performed.  Romelle Reiley FRANCES 08/18/2014, 10:03 AM

## 2014-08-19 ENCOUNTER — Other Ambulatory Visit: Payer: Self-pay | Admitting: Family Medicine

## 2014-08-22 ENCOUNTER — Other Ambulatory Visit: Payer: PPO

## 2014-08-23 ENCOUNTER — Ambulatory Visit: Payer: PPO

## 2014-08-25 ENCOUNTER — Ambulatory Visit (INDEPENDENT_AMBULATORY_CARE_PROVIDER_SITE_OTHER): Payer: PPO | Admitting: *Deleted

## 2014-08-25 ENCOUNTER — Other Ambulatory Visit (INDEPENDENT_AMBULATORY_CARE_PROVIDER_SITE_OTHER): Payer: PPO

## 2014-08-25 ENCOUNTER — Other Ambulatory Visit: Payer: PPO

## 2014-08-25 DIAGNOSIS — D519 Vitamin B12 deficiency anemia, unspecified: Secondary | ICD-10-CM

## 2014-08-25 DIAGNOSIS — I482 Chronic atrial fibrillation, unspecified: Secondary | ICD-10-CM

## 2014-08-25 DIAGNOSIS — Z7901 Long term (current) use of anticoagulants: Secondary | ICD-10-CM

## 2014-08-25 DIAGNOSIS — I4891 Unspecified atrial fibrillation: Secondary | ICD-10-CM | POA: Diagnosis not present

## 2014-08-25 DIAGNOSIS — Z5181 Encounter for therapeutic drug level monitoring: Secondary | ICD-10-CM | POA: Diagnosis not present

## 2014-08-25 LAB — POCT INR: INR: 1.9

## 2014-08-25 MED ORDER — CYANOCOBALAMIN 1000 MCG/ML IJ SOLN
1000.0000 ug | Freq: Once | INTRAMUSCULAR | Status: AC
  Administered 2014-08-25: 1000 ug via INTRAMUSCULAR

## 2014-08-31 ENCOUNTER — Encounter: Payer: Self-pay | Admitting: Family Medicine

## 2014-08-31 ENCOUNTER — Ambulatory Visit (INDEPENDENT_AMBULATORY_CARE_PROVIDER_SITE_OTHER): Payer: PPO | Admitting: Family Medicine

## 2014-08-31 VITALS — BP 126/70 | HR 65 | Temp 98.0°F | Wt 198.5 lb

## 2014-08-31 DIAGNOSIS — H6120 Impacted cerumen, unspecified ear: Secondary | ICD-10-CM

## 2014-08-31 DIAGNOSIS — G458 Other transient cerebral ischemic attacks and related syndromes: Secondary | ICD-10-CM

## 2014-08-31 DIAGNOSIS — I5032 Chronic diastolic (congestive) heart failure: Secondary | ICD-10-CM | POA: Diagnosis not present

## 2014-08-31 DIAGNOSIS — I6523 Occlusion and stenosis of bilateral carotid arteries: Secondary | ICD-10-CM | POA: Diagnosis not present

## 2014-08-31 DIAGNOSIS — Z7901 Long term (current) use of anticoagulants: Secondary | ICD-10-CM | POA: Diagnosis not present

## 2014-08-31 DIAGNOSIS — N184 Chronic kidney disease, stage 4 (severe): Secondary | ICD-10-CM | POA: Diagnosis not present

## 2014-08-31 DIAGNOSIS — Z5181 Encounter for therapeutic drug level monitoring: Secondary | ICD-10-CM

## 2014-08-31 DIAGNOSIS — I4891 Unspecified atrial fibrillation: Secondary | ICD-10-CM | POA: Diagnosis not present

## 2014-08-31 DIAGNOSIS — H6123 Impacted cerumen, bilateral: Secondary | ICD-10-CM | POA: Diagnosis not present

## 2014-08-31 DIAGNOSIS — I482 Chronic atrial fibrillation, unspecified: Secondary | ICD-10-CM

## 2014-08-31 DIAGNOSIS — R42 Dizziness and giddiness: Secondary | ICD-10-CM

## 2014-08-31 LAB — BASIC METABOLIC PANEL
BUN: 18 mg/dL (ref 6–23)
CO2: 32 mEq/L (ref 19–32)
Calcium: 9.3 mg/dL (ref 8.4–10.5)
Chloride: 102 mEq/L (ref 96–112)
Creatinine, Ser: 1.47 mg/dL — ABNORMAL HIGH (ref 0.40–1.20)
GFR: 36.49 mL/min — ABNORMAL LOW (ref >60.00)
Glucose, Bld: 81 mg/dL (ref 70–99)
Potassium: 3.9 mEq/L (ref 3.5–5.1)
Sodium: 139 mEq/L (ref 135–145)

## 2014-08-31 LAB — POCT INR: INR: 1.9

## 2014-08-31 NOTE — Addendum Note (Signed)
Addended by: Ellamae Sia on: 08/31/2014 12:03 PM   Modules accepted: Orders

## 2014-08-31 NOTE — Assessment & Plan Note (Addendum)
On coumadin, amiodarone and beta blocker. Due for repeat PT/INR today. Has follow up with Dr. Rockey Situ next month.

## 2014-08-31 NOTE — Assessment & Plan Note (Signed)
Consistent with vertigo- ? Due to asymmetrical cerumen impaction. See below.

## 2014-08-31 NOTE — Progress Notes (Signed)
Subjective:   Patient ID: [name], female    DOB: [birth year], 81 y.o.   MRN: [id number]  [name] is a pleasant 81 y.o. year old female with h/o a fib on coumadin, carotid artery stenosis, renal cell CA s/p nephrectomy, CKD and CHF who presents to clinic today with Hospitalization Follow-up and Dizziness  on 08/31/2014  HPI:  Hospital notes reviewed. Admitted to [facility] 4/12- 08/18/14 for episode that seems consistent with TIA. Woke up with generalized weakness and developed right upper extremity weakness and slurred speech.  By the time her daughter was able to get to her house, her symptoms had already resolved.  In ER- CBC, BMET wnl.  INR 2.09 UA pos but she was asymptomatic. Head CT- no acute findings (MRI could not be done due to AICD). Increased dose of statin.  Has had no recurrent TIA symptoms other than dizziness for past week.  Dizzy mainly when she changes head position quickly. No CP or SOB. No nausea or vomiting. Lab Results  Component Value Date   CHOL 160 08/17/2014   HDL 31* 08/17/2014   LDLCALC 97 08/17/2014   LDLDIRECT 150.3 04/10/2009   TRIG 158* 08/17/2014   CHOLHDL 5.2 08/17/2014   Dg Chest 2 View  08/16/2014   CLINICAL DATA:  right hand weakness, question TIA  EXAM: CHEST  2 VIEW  COMPARISON:  01/14/2014  FINDINGS: Cardiomediastinal silhouette is stable. Three leads cardiac pacemaker is unchanged in position. No acute infiltrate or pleural effusion. No pulmonary edema.  IMPRESSION: No active cardiopulmonary disease.   Electronically Signed   By: Lahoma Crocker M.D.   On: 08/16/2014 21:32   Ct Head (brain) Wo Contrast  08/16/2014   CLINICAL DATA:  Right arm weakness  EXAM: CT HEAD WITHOUT CONTRAST  TECHNIQUE: Contiguous axial images were obtained from the base of the skull through the vertex without intravenous contrast.  COMPARISON:  None.  FINDINGS: No skull fracture is noted. Paranasal sinuses and mastoid air cells are unremarkable. Atherosclerotic  calcifications of carotid siphon. Mild cerebral atrophy. Mild periventricular and patchy subcortical white matter decreased attenuation probable due to chronic small vessel ischemic changes. No definite acute cortical infarction. No mass lesion is noted on this unenhanced scan.  No intracranial hemorrhage, mass effect or midline shift  IMPRESSION: No acute intracranial abnormality. Mild cerebral atrophy. Mild periventricular white matter decreased attenuation probable due to chronic small vessel ischemic changes. No definite acute cortical infarction.   Electronically Signed   By: Lahoma Crocker M.D.   On: 08/16/2014 19:04   A fib- on amiodarone, beta blocker and coumadin.  Due to have PT/INR rechecked today.  CKD s/p nephrectomy due to renal cell CA- at discharge cr was at baseline. Lab Results  Component Value Date   CREATININE 1.45* 08/17/2014   Bilateral carotid stenosis- due for vascular surgery follow up.  Current Outpatient Prescriptions on File Prior to Visit  Medication Sig Dispense Refill  . acetaminophen (TYLENOL) 500 MG tablet Take 1,000 mg by mouth every 6 (six) hours as needed for headache.    Marland Kitchen atorvastatin (LIPITOR) 80 MG tablet Take 1 tablet (80 mg total) by mouth every evening. 30 tablet 0  . carvedilol (COREG) 12.5 MG tablet Take 1 tablet (12.5 mg total) by mouth 2 (two) times daily with a meal. 60 tablet 6  . cetirizine (ZYRTEC) 10 MG tablet Take 10 mg by mouth daily.     . cyanocobalamin (,VITAMIN B-12,) 1000 MCG/ML injection Inject 1,000 mcg  into the muscle every 30 (thirty) days.     Marland Kitchen disopyramide (NORPACE) 150 MG capsule Take 1 capsule (150 mg total) by mouth 2 (two) times daily. 60 capsule 3  . furosemide (LASIX) 40 MG tablet Take 1 tablet (40 mg total) by mouth 2 (two) times daily. 60 tablet 3  . potassium chloride (K-DUR,KLOR-CON) 10 MEQ tablet Take 1 tablet (10 mEq total) by mouth 2 (two) times daily with breakfast and lunch. 60 tablet 6  . warfarin (COUMADIN) 1 MG  tablet TAKE AS DIRECTED BY COUMADIN CLINIC 30 tablet 0  . warfarin (COUMADIN) 2 MG tablet TAKE 1 TABLET BY MOUTH ONCE A DAY 30 tablet 0  . [DISCONTINUED] hydrochlorothiazide (HYDRODIURIL) 25 MG tablet Take 25 mg by mouth daily after breakfast.      No current facility-administered medications on file prior to visit.    Allergies  Allergen Reactions  . Codeine Nausea And Vomiting  . Morphine And Related Nausea And Vomiting  . Sulfonamide Derivatives Other (See Comments)    REACTION: Dry mouth    Past Medical History  Diagnosis Date  . Carotid artery stenosis 09/2007    60-79% bilateral (stable)  . Carotid artery stenosis 10/2008    40-59% R 60-79%   . Chest pain, unspecified   . Atrial fibrillation   . Sinus bradycardia   . Hypotension, unspecified     cardiac cath 2006..nonobstructive CAD 30-40s lesions.Marland KitchenETT 1/09 nondiagnostic due to poor HR response..Right Renal Cancer 2003  . Blood in stool   . Secondary cardiomyopathy, unspecified   . Chronic airway obstruction, not elsewhere classified   . Long term (current) use of anticoagulants   . Dysuria   . Edema   . Encounter for therapeutic drug monitoring   . Other malaise and fatigue   . Hypertrophic obstructive cardiomyopathy   . Urge incontinence   . Other acute sinusitis   . Other and unspecified coagulation defects   . Other screening mammogram   . Pre-operative cardiovascular examination   . Hemorrhage of rectum and anus   . Chronic kidney disease, unspecified   . Special screening for osteoporosis   . Hypertension 05/20/2011  . PONV (postoperative nausea and vomiting)   . Adjustment disorder with anxiety   . Coronary artery disease     non-obstructive by 2006 cath  . Automatic implantable cardioverter-defibrillator in situ   . High cholesterol   . Anginal pain   . Chronic bronchitis     "get it some; not q year" (11/11/2012)  . Exertional shortness of breath   . History of blood transfusion 04/2011    "after hip  OR" (11/11/2012)  . Arthritis     "some in my hands" (11/11/2012)  . Osteoarthritis of right hip   . Renal cancer 06/2001    Right  . Malignant neoplasm of kidney, except pelvis   . Frequent UTI     "get them a couple times/yr" (11/11/2012)  . Chronic diastolic CHF (congestive heart failure) 06/04/2013    Past Surgical History  Procedure Laterality Date  . Abdominal hysterectomy  1975    for benign causes  . Cardiac catheterization  2006  . Nephrectomy Right 06/2001     S/P RENAL CELL CANCER  . Cataract extraction w/ intraocular lens  implant, bilateral  01/2006-02-2006  . Insert / replace / remove pacemaker  05-01-11    02-28-05-/05-04-10-ICD-MEDTRONIC MAXIMAL DR  . Appendectomy    . Total hip arthroplasty Right 05/03/2011    Procedure: TOTAL HIP  ARTHROPLASTY ANTERIOR APPROACH;  Surgeon: Mcarthur Rossetti;  Location: [facility];  Service: Orthopedics;  Laterality: Right;  Removal of Cannulated Screws Right Hip, Right Direct Anterior Hip Replacement  . Laparoscopic cholecystectomy  11/11/2012  . Bi-ventricular pacemaker upgrade  05/04/2010  . Joint replacement    . Cholecystectomy N/A 11/11/2012    Procedure: LAPAROSCOPIC CHOLECYSTECTOMY WITH INTRAOPERATIVE CHOLANGIOGRAM;  Surgeon: Imogene Burn. Georgette Dover, MD;  Location: [place];  Service: General;  Laterality: N/A;  . Laparoscopic lysis of adhesions N/A 11/11/2012    Procedure: LAPAROSCOPIC LYSIS OF ADHESIONS;  Surgeon: Imogene Burn. Georgette Dover, MD;  Location: [place] [facility];  Service: General;  Laterality: N/A;    Family History  Problem Relation Age of Onset  . Colon cancer Neg Hx   . Heart failure Mother     History   Social History  . Marital Status: Widowed    Spouse Name: N/A  . Number of Children: N/A  . Years of Education: N/A   Occupational History  . Retired    Social History Main Topics  . Smoking status: Former Smoker -- 0.50 packs/day for 40 years    Types: Cigarettes    Quit date: 05/06/2001  . Smokeless tobacco: Never Used  . Alcohol  Use: No  . Drug Use: No  . Sexual Activity: No   Other Topics Concern  . 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   ALCOHOL USE-NO   RETIRED   FORMER TOBACCO USE.....1/2 PACK X 38 YRS UNTIL 2003   The PMH, PSH, Social History, Family History, Medications, and allergies have been reviewed in [facility], and have been updated if relevant.   Review of Systems  Constitutional: Negative.   Eyes: Negative.   Respiratory: Negative.   Cardiovascular: Negative.   Gastrointestinal: Negative.   Endocrine: Negative.   Musculoskeletal: Negative.   Skin: Negative.   Neurological: Positive for dizziness. Negative for tremors, seizures, syncope, facial asymmetry, speech difficulty, weakness, light-headedness, numbness and headaches.  Psychiatric/Behavioral: Negative.   All other systems reviewed and are negative.      Objective:    BP 126/70 mmHg  Pulse 65  Temp(Src) 98 F (36.7 C) (Oral)  Wt 198 lb 8 oz (90.039 kg)  SpO2 96%   Physical Exam  Constitutional: She is oriented to person, place, and time. She appears well-developed and well-nourished. No distress.  HENT:  Head: Normocephalic.  Cerumen impaction bilaterally Dix hallpike positive  Eyes: Conjunctivae are normal.  Neck: Normal range of motion.  Cardiovascular: Normal rate.   Murmur heard. Pulmonary/Chest: Effort normal and breath sounds normal.  Musculoskeletal: Normal range of motion.  Neurological: She is alert and oriented to person, place, and time. No cranial nerve deficit.  Skin: Skin is warm and dry.  Psychiatric: She has a normal mood and affect. Her behavior is normal. Judgment and thought content normal.  Nursing note and vitals reviewed.         Assessment & Plan:   Other specified transient cerebral ischemias  Chronic kidney disease (CKD),  stage IV (severe)  Carotid artery stenosis, bilateral  Chronic atrial fibrillation No Follow-up on file.

## 2014-08-31 NOTE — Addendum Note (Signed)
Addended by: Royann Shivers A on: 08/31/2014 12:03 PM   Modules accepted: Orders

## 2014-08-31 NOTE — Assessment & Plan Note (Signed)
Chronic- followed by Dr. Rockey Situ- discussed with pt.  Will defer vascular consult at this point since he has been monitoring her carotid stenosis and she sees him again next month. The patient indicates understanding of these issues and agrees with the plan.

## 2014-08-31 NOTE — Progress Notes (Signed)
Pre visit review using our clinic review tool, if applicable. No additional management support is needed unless otherwise documented below in the visit note. 

## 2014-08-31 NOTE — Patient Instructions (Signed)
Good to see you. We will call you with your lab results and your neurology referral.

## 2014-08-31 NOTE — Assessment & Plan Note (Signed)
Symptoms resolved. On statin. Refer to neurology.

## 2014-08-31 NOTE — Assessment & Plan Note (Signed)
Ceruminosis is noted.  Wax is removed by syringing and manual debridement. Instructions for home care to prevent wax buildup are given.  

## 2014-09-07 ENCOUNTER — Other Ambulatory Visit: Payer: PPO

## 2014-09-08 ENCOUNTER — Telehealth: Payer: Self-pay | Admitting: *Deleted

## 2014-09-08 ENCOUNTER — Other Ambulatory Visit (INDEPENDENT_AMBULATORY_CARE_PROVIDER_SITE_OTHER): Payer: PPO

## 2014-09-08 DIAGNOSIS — Z5181 Encounter for therapeutic drug level monitoring: Secondary | ICD-10-CM

## 2014-09-08 DIAGNOSIS — Z7901 Long term (current) use of anticoagulants: Secondary | ICD-10-CM | POA: Diagnosis not present

## 2014-09-08 DIAGNOSIS — I4891 Unspecified atrial fibrillation: Secondary | ICD-10-CM

## 2014-09-08 LAB — POCT INR: INR: 1.7

## 2014-09-08 NOTE — Telephone Encounter (Signed)
-----   Message from Lucille Passy, MD sent at 09/08/2014 12:50 PM EDT ----- Regarding: FW: lab results It looks like I commented on INR but not BMET- electrolytes good and kidney function stable. ----- Message -----    From: Ellamae Sia    Sent: 09/08/2014  12:41 PM      To: Lucille Passy, MD, [place]ena Nunnery, CMA Subject: lab results                                    Please call pt with lab results, pt said she has not been called. Thank You

## 2014-09-08 NOTE — Telephone Encounter (Signed)
Lm on pts vm and informed her of results

## 2014-09-09 ENCOUNTER — Other Ambulatory Visit: Payer: Self-pay

## 2014-09-09 MED ORDER — FUROSEMIDE 40 MG PO TABS: 40.0000 mg | ORAL_TABLET | Freq: Two times a day (BID) | ORAL | Status: DC

## 2014-09-13 ENCOUNTER — Ambulatory Visit (INDEPENDENT_AMBULATORY_CARE_PROVIDER_SITE_OTHER): Payer: PPO | Admitting: Family Medicine

## 2014-09-13 ENCOUNTER — Encounter: Payer: Self-pay | Admitting: Family Medicine

## 2014-09-13 VITALS — BP 120/64 | HR 73 | Temp 98.5°F | Wt 198.8 lb

## 2014-09-13 DIAGNOSIS — J018 Other acute sinusitis: Secondary | ICD-10-CM

## 2014-09-13 MED ORDER — FLUTICASONE PROPIONATE 50 MCG/ACT NA SUSP: 2.0000 | Freq: Every day | NASAL | Status: DC

## 2014-09-13 NOTE — Progress Notes (Signed)
SUBJECTIVE:  [name] is a 81 y.o. female who complains of coryza, congestion and sneezing for 2 days. She denies a history of anorexia, chest pain, fatigue, fevers, myalgias, shortness of breath, sweats, vomiting, weakness and weight loss and admits to a history of asthma. Patient denies smoke cigarettes.   Current Outpatient Prescriptions on File Prior to Visit  Medication Sig Dispense Refill  . acetaminophen (TYLENOL) 500 MG tablet Take 1,000 mg by mouth every 6 (six) hours as needed for headache.    Marland Kitchen atorvastatin (LIPITOR) 80 MG tablet Take 1 tablet (80 mg total) by mouth every evening. 30 tablet 0  . carvedilol (COREG) 12.5 MG tablet Take 1 tablet (12.5 mg total) by mouth 2 (two) times daily with a meal. 60 tablet 6  . cetirizine (ZYRTEC) 10 MG tablet Take 10 mg by mouth daily.     . cyanocobalamin (,VITAMIN B-12,) 1000 MCG/ML injection Inject 1,000 mcg into the muscle every 30 (thirty) days.     Marland Kitchen disopyramide (NORPACE) 150 MG capsule Take 1 capsule (150 mg total) by mouth 2 (two) times daily. 60 capsule 3  . furosemide (LASIX) 40 MG tablet Take 1 tablet (40 mg total) by mouth 2 (two) times daily. 60 tablet 3  . potassium chloride (K-DUR,KLOR-CON) 10 MEQ tablet Take 1 tablet (10 mEq total) by mouth 2 (two) times daily with breakfast and lunch. 60 tablet 6  . warfarin (COUMADIN) 1 MG tablet TAKE AS DIRECTED BY COUMADIN CLINIC 30 tablet 0  . warfarin (COUMADIN) 2 MG tablet TAKE 1 TABLET BY MOUTH ONCE A DAY 30 tablet 0  . [DISCONTINUED] hydrochlorothiazide (HYDRODIURIL) 25 MG tablet Take 25 mg by mouth daily after breakfast.      No current facility-administered medications on file prior to visit.    Allergies  Allergen Reactions  . Codeine Nausea And Vomiting  . Morphine And Related Nausea And Vomiting  . Sulfonamide Derivatives Other (See Comments)    REACTION: Dry mouth    Past Medical History  Diagnosis Date  . Carotid artery stenosis 09/2007    60-79% bilateral  (stable)  . Carotid artery stenosis 10/2008    40-59% R 60-79%   . Chest pain, unspecified   . Atrial fibrillation   . Sinus bradycardia   . Hypotension, unspecified     cardiac cath 2006..nonobstructive CAD 30-40s lesions.Marland KitchenETT 1/09 nondiagnostic due to poor HR response..Right Renal Cancer 2003  . Blood in stool   . Secondary cardiomyopathy, unspecified   . Chronic airway obstruction, not elsewhere classified   . Long term (current) use of anticoagulants   . Dysuria   . Edema   . Encounter for therapeutic drug monitoring   . Other malaise and fatigue   . Hypertrophic obstructive cardiomyopathy   . Urge incontinence   . Other acute sinusitis   . Other and unspecified coagulation defects   . Other screening mammogram   . Pre-operative cardiovascular examination   . Hemorrhage of rectum and anus   . Chronic kidney disease, unspecified   . Special screening for osteoporosis   . Hypertension 05/20/2011  . PONV (postoperative nausea and vomiting)   . Adjustment disorder with anxiety   . Coronary artery disease     non-obstructive by 2006 cath  . Automatic implantable cardioverter-defibrillator in situ   . High cholesterol   . Anginal pain   . Chronic bronchitis     "get it some; not q year" (11/11/2012)  . Exertional shortness of breath   .  History of blood transfusion 04/2011    "after hip OR" (11/11/2012)  . Arthritis     "some in my hands" (11/11/2012)  . Osteoarthritis of right hip   . Renal cancer 06/2001    Right  . Malignant neoplasm of kidney, except pelvis   . Frequent UTI     "get them a couple times/yr" (11/11/2012)  . Chronic diastolic CHF (congestive heart failure) 06/04/2013    Past Surgical History  Procedure Laterality Date  . Abdominal hysterectomy  1975    for benign causes  . Cardiac catheterization  2006  . Nephrectomy Right 06/2001     S/P RENAL CELL CANCER  . Cataract extraction w/ intraocular lens  implant, bilateral  01/2006-02-2006  . Insert / replace /  remove pacemaker  05-01-11    02-28-05-/05-04-10-ICD-MEDTRONIC MAXIMAL DR  . Appendectomy    . Total hip arthroplasty Right 05/03/2011    Procedure: TOTAL HIP ARTHROPLASTY ANTERIOR APPROACH;  Surgeon: Mcarthur Rossetti;  Location: [facility];  Service: Orthopedics;  Laterality: Right;  Removal of Cannulated Screws Right Hip, Right Direct Anterior Hip Replacement  . Laparoscopic cholecystectomy  11/11/2012  . Bi-ventricular pacemaker upgrade  05/04/2010  . Joint replacement    . Cholecystectomy N/A 11/11/2012    Procedure: LAPAROSCOPIC CHOLECYSTECTOMY WITH INTRAOPERATIVE CHOLANGIOGRAM;  Surgeon: Imogene Burn. Georgette Dover, MD;  Location: [facility];  Service: General;  Laterality: N/A;  . Laparoscopic lysis of adhesions N/A 11/11/2012    Procedure: LAPAROSCOPIC LYSIS OF ADHESIONS;  Surgeon: Imogene Burn. Georgette Dover, MD;  Location: [facility];  Service: General;  Laterality: N/A;    Family History  Problem Relation Age of Onset  . Colon cancer Neg Hx   . Heart failure Mother     History   Social History  . Marital Status: Widowed    Spouse Name: N/A  . Number of Children: N/A  . Years of Education: N/A   Occupational History  . Retired    Social History Main Topics  . Smoking status: Former Smoker -- 0.50 packs/day for 40 years    Types: Cigarettes    Quit date: 05/06/2001  . Smokeless tobacco: Never Used  . Alcohol Use: No  . Drug Use: No  . Sexual Activity: No   Other Topics Concern  . 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   ALCOHOL USE-NO   RETIRED   FORMER TOBACCO USE.....1/2 PACK X 72 YRS UNTIL 2003   The PMH, PSH, Social History, Family History, Medications, and allergies have been reviewed in [facility], and have been updated if relevant.  OBJECTIVE: BP 120/64 mmHg  Pulse 73  Temp(Src) 98.5 F (36.9 C) (Oral)  Wt 198 lb 12 oz  (90.152 kg)  SpO2 95%  She appears well, vital signs are as noted. Ears normal.  Throat and pharynx normal.  Neck supple. No adenopathy in the neck. Nose is congested. Sinuses non tender. The chest is clear, without wheezes or rales.  ASSESSMENT:  viral upper respiratory illness  PLAN: Symptomatic therapy suggested: push fluids, rest and return office visit prn if symptoms persist or worsen. Lack of antibiotic effectiveness discussed with her. Call or return to clinic prn if these symptoms worsen or fail to improve as anticipated.

## 2014-09-13 NOTE — Patient Instructions (Signed)
Good to see you. Start flonase. Keep me updated.

## 2014-09-13 NOTE — Progress Notes (Signed)
Pre visit review using our clinic review tool, if applicable. No additional management support is needed unless otherwise documented below in the visit note. 

## 2014-09-14 ENCOUNTER — Ambulatory Visit: Payer: PPO | Admitting: Cardiovascular Disease

## 2014-09-15 ENCOUNTER — Telehealth: Payer: Self-pay

## 2014-09-15 DIAGNOSIS — B9789 Other viral agents as the cause of diseases classified elsewhere: Secondary | ICD-10-CM

## 2014-09-15 DIAGNOSIS — J329 Chronic sinusitis, unspecified: Principal | ICD-10-CM

## 2014-09-15 MED ORDER — AMOXICILLIN 875 MG PO TABS: 875.0000 mg | ORAL_TABLET | Freq: Two times a day (BID) | ORAL | Status: DC

## 2014-09-15 NOTE — Telephone Encounter (Signed)
eRx sent for augmentin.  Please make sure she comes in to get her INR checked in 10 days.

## 2014-09-15 NOTE — Telephone Encounter (Signed)
Pt left v/m; pt was seen 09/13/14 and pt said Dr Deborra Medina advised if pt did not get better to cb and med would be called in. Pt request med sent to [place] [facility]. Pt feels worse;throat is raw,h/a hurts worse when bends over, coughs more often and congestion in throat. Pt request cb. Dr Deborra Medina out of office and will send note to Dr Deborra Medina and Webb Silversmith NP.

## 2014-09-16 ENCOUNTER — Other Ambulatory Visit: Payer: PPO

## 2014-09-16 ENCOUNTER — Other Ambulatory Visit: Payer: Self-pay | Admitting: Family Medicine

## 2014-09-16 ENCOUNTER — Telehealth: Payer: Self-pay | Admitting: Family Medicine

## 2014-09-16 DIAGNOSIS — J329 Chronic sinusitis, unspecified: Principal | ICD-10-CM

## 2014-09-16 DIAGNOSIS — B9789 Other viral agents as the cause of diseases classified elsewhere: Secondary | ICD-10-CM

## 2014-09-16 MED ORDER — AMOXICILLIN 875 MG PO TABS: 875.0000 mg | ORAL_TABLET | Freq: Two times a day (BID) | ORAL | Status: DC

## 2014-09-16 NOTE — Addendum Note (Signed)
Addended by: Modena Nunnery on: 09/16/2014 09:23 AM   Modules accepted: Orders

## 2014-09-16 NOTE — Telephone Encounter (Signed)
Patient called in to get response to message left yesterday.  I let patient know rx was called in.  Patient cancelled her pt/inr for today because she felt too bad to come in. [name] please call patient back and schedule pt/inr.

## 2014-09-16 NOTE — Telephone Encounter (Addendum)
pls schedule for 05/23 per Dr Deborra Medina

## 2014-09-23 ENCOUNTER — Other Ambulatory Visit (INDEPENDENT_AMBULATORY_CARE_PROVIDER_SITE_OTHER): Payer: PPO

## 2014-09-23 ENCOUNTER — Ambulatory Visit (INDEPENDENT_AMBULATORY_CARE_PROVIDER_SITE_OTHER): Payer: PPO | Admitting: *Deleted

## 2014-09-23 DIAGNOSIS — Z5181 Encounter for therapeutic drug level monitoring: Secondary | ICD-10-CM | POA: Diagnosis not present

## 2014-09-23 DIAGNOSIS — Z7901 Long term (current) use of anticoagulants: Secondary | ICD-10-CM | POA: Diagnosis not present

## 2014-09-23 DIAGNOSIS — I4891 Unspecified atrial fibrillation: Secondary | ICD-10-CM

## 2014-09-23 DIAGNOSIS — E538 Deficiency of other specified B group vitamins: Secondary | ICD-10-CM

## 2014-09-23 LAB — POCT INR: INR: 2

## 2014-09-23 MED ORDER — CYANOCOBALAMIN 1000 MCG/ML IJ SOLN
1000.0000 ug | Freq: Once | INTRAMUSCULAR | Status: AC
  Administered 2014-09-23: 1000 ug via INTRAMUSCULAR

## 2014-09-27 ENCOUNTER — Ambulatory Visit: Payer: PPO | Admitting: Family Medicine

## 2014-10-05 ENCOUNTER — Encounter: Payer: Self-pay | Admitting: Family Medicine

## 2014-10-05 ENCOUNTER — Ambulatory Visit (INDEPENDENT_AMBULATORY_CARE_PROVIDER_SITE_OTHER): Payer: PPO | Admitting: Family Medicine

## 2014-10-05 VITALS — BP 136/66 | HR 85 | Temp 98.2°F | Wt 194.8 lb

## 2014-10-05 DIAGNOSIS — R059 Cough, unspecified: Secondary | ICD-10-CM

## 2014-10-05 DIAGNOSIS — R05 Cough: Secondary | ICD-10-CM | POA: Diagnosis not present

## 2014-10-05 MED ORDER — BENZONATATE 200 MG PO CAPS: 200.0000 mg | ORAL_CAPSULE | Freq: Two times a day (BID) | ORAL | Status: DC | PRN

## 2014-10-05 NOTE — Progress Notes (Signed)
SUBJECTIVE:  [name] is a 81 y.o. female who complains of coryza, congestion and sneezing for 2 days. She denies a history of anorexia, chest pain, fatigue, fevers, myalgias, shortness of breath, sweats, vomiting, weakness and weight loss and admits to a history of asthma. Patient denies smoke cigarettes.   Treated with Augmentin two weeks ago.  Negative chest xray on 08/16/14.  Feels overall better but cough is still lingering and nose running. Cough is dry- no fevers.     Current Outpatient Prescriptions on File Prior to Visit  Medication Sig Dispense Refill  . acetaminophen (TYLENOL) 500 MG tablet Take 1,000 mg by mouth every 6 (six) hours as needed for headache.    Marland Kitchen atorvastatin (LIPITOR) 80 MG tablet Take 1 tablet (80 mg total) by mouth every evening. 30 tablet 0  . carvedilol (COREG) 12.5 MG tablet Take 1 tablet (12.5 mg total) by mouth 2 (two) times daily with a meal. 60 tablet 6  . cetirizine (ZYRTEC) 10 MG tablet Take 10 mg by mouth daily.     . cyanocobalamin (,VITAMIN B-12,) 1000 MCG/ML injection Inject 1,000 mcg into the muscle every 30 (thirty) days.     Marland Kitchen disopyramide (NORPACE) 150 MG capsule Take 1 capsule (150 mg total) by mouth 2 (two) times daily. 60 capsule 3  . fluticasone (FLONASE) 50 MCG/ACT nasal spray Place 2 sprays into both nostrils daily. 16 g 2  . furosemide (LASIX) 40 MG tablet Take 1 tablet (40 mg total) by mouth 2 (two) times daily. 60 tablet 3  . potassium chloride (K-DUR,KLOR-CON) 10 MEQ tablet Take 1 tablet (10 mEq total) by mouth 2 (two) times daily with breakfast and lunch. 60 tablet 6  . warfarin (COUMADIN) 1 MG tablet TAKE AS DIRECTED BY COUMADIN CLINIC 30 tablet 0  . warfarin (COUMADIN) 2 MG tablet TAKE 1 TABLET BY MOUTH ONCE A DAY 30 tablet 3  . [DISCONTINUED] hydrochlorothiazide (HYDRODIURIL) 25 MG tablet Take 25 mg by mouth daily after breakfast.      No current facility-administered medications on file prior to visit.    Allergies   Allergen Reactions  . Codeine Nausea And Vomiting  . Morphine And Related Nausea And Vomiting  . Sulfonamide Derivatives Other (See Comments)    REACTION: Dry mouth    Past Medical History  Diagnosis Date  . Carotid artery stenosis 09/2007    60-79% bilateral (stable)  . Carotid artery stenosis 10/2008    40-59% R 60-79%   . Chest pain, unspecified   . Atrial fibrillation   . Sinus bradycardia   . Hypotension, unspecified     cardiac cath 2006..nonobstructive CAD 30-40s lesions.Marland KitchenETT 1/09 nondiagnostic due to poor HR response..Right Renal Cancer 2003  . Blood in stool   . Secondary cardiomyopathy, unspecified   . Chronic airway obstruction, not elsewhere classified   . Long term (current) use of anticoagulants   . Dysuria   . Edema   . Encounter for therapeutic drug monitoring   . Other malaise and fatigue   . Hypertrophic obstructive cardiomyopathy   . Urge incontinence   . Other acute sinusitis   . Other and unspecified coagulation defects   . Other screening mammogram   . Pre-operative cardiovascular examination   . Hemorrhage of rectum and anus   . Chronic kidney disease, unspecified   . Special screening for osteoporosis   . Hypertension 05/20/2011  . PONV (postoperative nausea and vomiting)   . Adjustment disorder with anxiety   . Coronary artery  disease     non-obstructive by 2006 cath  . Automatic implantable cardioverter-defibrillator in situ   . High cholesterol   . Anginal pain   . Chronic bronchitis     "get it some; not q year" (11/11/2012)  . Exertional shortness of breath   . History of blood transfusion 04/2011    "after hip OR" (11/11/2012)  . Arthritis     "some in my hands" (11/11/2012)  . Osteoarthritis of right hip   . Renal cancer 06/2001    Right  . Malignant neoplasm of kidney, except pelvis   . Frequent UTI     "get them a couple times/yr" (11/11/2012)  . Chronic diastolic CHF (congestive heart failure) 06/04/2013    Past Surgical History   Procedure Laterality Date  . Abdominal hysterectomy  1975    for benign causes  . Cardiac catheterization  2006  . Nephrectomy Right 06/2001     S/P RENAL CELL CANCER  . Cataract extraction w/ intraocular lens  implant, bilateral  01/2006-02-2006  . Insert / replace / remove pacemaker  05-01-11    02-28-05-/05-04-10-ICD-MEDTRONIC MAXIMAL DR  . Appendectomy    . Total hip arthroplasty Right 05/03/2011    Procedure: TOTAL HIP ARTHROPLASTY ANTERIOR APPROACH;  Surgeon: Mcarthur Rossetti;  Location: [facility];  Service: Orthopedics;  Laterality: Right;  Removal of Cannulated Screws Right Hip, Right Direct Anterior Hip Replacement  . Laparoscopic cholecystectomy  11/11/2012  . Bi-ventricular pacemaker upgrade  05/04/2010  . Joint replacement    . Cholecystectomy N/A 11/11/2012    Procedure: LAPAROSCOPIC CHOLECYSTECTOMY WITH INTRAOPERATIVE CHOLANGIOGRAM;  Surgeon: Imogene Burn. Georgette Dover, MD;  Location: [facility];  Service: General;  Laterality: N/A;  . Laparoscopic lysis of adhesions N/A 11/11/2012    Procedure: LAPAROSCOPIC LYSIS OF ADHESIONS;  Surgeon: Imogene Burn. Georgette Dover, MD;  Location: [place] [facility];  Service: General;  Laterality: N/A;    Family History  Problem Relation Age of Onset  . Colon cancer Neg Hx   . Heart failure Mother     History   Social History  . Marital Status: Widowed    Spouse Name: N/A  . Number of Children: N/A  . Years of Education: N/A   Occupational History  . Retired    Social History Main Topics  . Smoking status: Former Smoker -- 0.50 packs/day for 40 years    Types: Cigarettes    Quit date: 05/06/2001  . Smokeless tobacco: Never Used  . Alcohol Use: No  . Drug Use: No  . Sexual Activity: No   Other Topics Concern  . 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   ALCOHOL USE-NO   RETIRED   FORMER TOBACCO  USE.....1/2 PACK X 87 YRS UNTIL 2003   The PMH, PSH, Social History, Family History, Medications, and allergies have been reviewed in [facility], and have been updated if relevant.  OBJECTIVE: BP 136/66 mmHg  Pulse 85  Temp(Src) 98.2 F (36.8 C) (Oral)  Wt 194 lb 12 oz (88.338 kg)  SpO2 98%  She appears well, vital signs are as noted right ear- cerumen impaction, left Tm normal  Throat and pharynx normal.  Neck supple. No adenopathy in the neck. Nose is congested. Sinuses non tender. The chest is clear, without wheezes or rales.  ASSESSMENT:  viral upper  respiratory illness  PLAN: Symptomatic therapy suggested: push fluids, Tessalon prn cough (codeine allergic), rest and return office visit prn if symptoms persist or worsen. Lack of antibiotic effectiveness discussed with her. Call or return to clinic prn if these symptoms worsen or fail to improve as anticipated.

## 2014-10-05 NOTE — Patient Instructions (Signed)
Please take tessalon as perles as needed for cough. Try debrox drops for your right ear.  Let's try allegra- stop zyrtec.   Keep me updated.

## 2014-10-13 ENCOUNTER — Other Ambulatory Visit: Payer: Self-pay

## 2014-10-13 MED ORDER — DISOPYRAMIDE PHOSPHATE 150 MG PO CAPS: 150.0000 mg | ORAL_CAPSULE | Freq: Two times a day (BID) | ORAL | Status: DC

## 2014-10-13 NOTE — Telephone Encounter (Signed)
Refill sent for Pacerone 150 mg.

## 2014-10-21 ENCOUNTER — Ambulatory Visit (INDEPENDENT_AMBULATORY_CARE_PROVIDER_SITE_OTHER): Payer: PPO

## 2014-10-21 ENCOUNTER — Other Ambulatory Visit (INDEPENDENT_AMBULATORY_CARE_PROVIDER_SITE_OTHER): Payer: PPO

## 2014-10-21 DIAGNOSIS — Z7901 Long term (current) use of anticoagulants: Secondary | ICD-10-CM | POA: Diagnosis not present

## 2014-10-21 DIAGNOSIS — E538 Deficiency of other specified B group vitamins: Secondary | ICD-10-CM | POA: Diagnosis not present

## 2014-10-21 DIAGNOSIS — Z5181 Encounter for therapeutic drug level monitoring: Secondary | ICD-10-CM

## 2014-10-21 DIAGNOSIS — I4891 Unspecified atrial fibrillation: Secondary | ICD-10-CM

## 2014-10-21 LAB — POCT INR: INR: 2.2

## 2014-10-21 MED ORDER — CYANOCOBALAMIN 1000 MCG/ML IJ SOLN
1000.0000 ug | Freq: Once | INTRAMUSCULAR | Status: AC
  Administered 2014-10-21: 1000 ug via INTRAMUSCULAR

## 2014-10-25 ENCOUNTER — Ambulatory Visit (INDEPENDENT_AMBULATORY_CARE_PROVIDER_SITE_OTHER): Payer: PPO | Admitting: *Deleted

## 2014-10-25 DIAGNOSIS — Z7902 Long term (current) use of antithrombotics/antiplatelets: Secondary | ICD-10-CM

## 2014-10-25 NOTE — Progress Notes (Signed)
Pre visit review using our clinic review tool, if applicable. No additional management support is needed unless otherwise documented below in the visit note. 

## 2014-10-26 ENCOUNTER — Ambulatory Visit: Payer: PPO | Admitting: Cardiovascular Disease

## 2014-11-21 ENCOUNTER — Ambulatory Visit (INDEPENDENT_AMBULATORY_CARE_PROVIDER_SITE_OTHER): Payer: PPO | Admitting: *Deleted

## 2014-11-21 DIAGNOSIS — Z7902 Long term (current) use of antithrombotics/antiplatelets: Secondary | ICD-10-CM

## 2014-11-21 DIAGNOSIS — Z7901 Long term (current) use of anticoagulants: Secondary | ICD-10-CM

## 2014-11-21 DIAGNOSIS — Z5181 Encounter for therapeutic drug level monitoring: Secondary | ICD-10-CM

## 2014-11-21 DIAGNOSIS — I4891 Unspecified atrial fibrillation: Secondary | ICD-10-CM

## 2014-11-21 DIAGNOSIS — E538 Deficiency of other specified B group vitamins: Secondary | ICD-10-CM

## 2014-11-21 LAB — POCT INR: INR: 2

## 2014-11-21 MED ORDER — CYANOCOBALAMIN 1000 MCG/ML IJ SOLN
1000.0000 ug | Freq: Once | INTRAMUSCULAR | Status: AC
  Administered 2014-11-21: 1000 ug via INTRAMUSCULAR

## 2014-11-21 NOTE — Progress Notes (Signed)
Pre visit review using our clinic review tool, if applicable. No additional management support is needed unless otherwise documented below in the visit note. 

## 2014-11-22 ENCOUNTER — Encounter: Payer: Self-pay | Admitting: Internal Medicine

## 2014-12-14 ENCOUNTER — Encounter: Payer: Self-pay | Admitting: Cardiovascular Disease

## 2014-12-14 ENCOUNTER — Ambulatory Visit (INDEPENDENT_AMBULATORY_CARE_PROVIDER_SITE_OTHER): Payer: PPO | Admitting: Cardiovascular Disease

## 2014-12-14 VITALS — BP 138/62 | HR 78 | Ht 67.0 in | Wt 193.0 lb

## 2014-12-14 DIAGNOSIS — I739 Peripheral vascular disease, unspecified: Secondary | ICD-10-CM | POA: Diagnosis not present

## 2014-12-14 DIAGNOSIS — I6523 Occlusion and stenosis of bilateral carotid arteries: Secondary | ICD-10-CM

## 2014-12-14 DIAGNOSIS — I10 Essential (primary) hypertension: Secondary | ICD-10-CM | POA: Diagnosis not present

## 2014-12-14 DIAGNOSIS — E785 Hyperlipidemia, unspecified: Secondary | ICD-10-CM

## 2014-12-14 DIAGNOSIS — I4891 Unspecified atrial fibrillation: Secondary | ICD-10-CM | POA: Diagnosis not present

## 2014-12-14 DIAGNOSIS — I5032 Chronic diastolic (congestive) heart failure: Secondary | ICD-10-CM | POA: Diagnosis not present

## 2014-12-14 DIAGNOSIS — J432 Centrilobular emphysema: Secondary | ICD-10-CM

## 2014-12-14 MED ORDER — ATORVASTATIN CALCIUM 80 MG PO TABS: 80.0000 mg | ORAL_TABLET | Freq: Every evening | ORAL | Status: DC

## 2014-12-14 NOTE — Progress Notes (Signed)
Patient ID: [name], female    DOB: [birth year], 81 y.o.   MRN: [id number]  HPI Comments: [name] is a 81 year old woman with a history of syncope, COPD, as well as symptomatic bradycardia.  She is status post ICD.  history of nonobstructive coronary artery disease with chronic chest pain, bilateral carotid artery stenosis 60-70% disease on the left, paroxysmal atrial fibrillation, renal cell carcinoma status post resection of her right kidney with residual chronic renal insufficiency, and COPD, stopped smoking over 10 years ago.   She has chronic leg pain She presents for routine follow-up of her carotid arterial disease, atrial fibrillation. She has a single kidney, creatinine 1.4  In follow-up today, she reports that she is doing relatively well. No recent episodes of atrial fibrillation that she has appreciated. She does continue to have chronic leg pain. No regular exercise. She would like to lose some weight, complains of shortness of breath with exertion. She was told intervention on her legs was not possible given her single kidney and renal dysfunction seen by Dr. Ronalee Belts She reports that she has been more compliant with her Lipitor 40 mg daily   EKG on today's visit shows AV paced rhythm 69 bpm  Other past medical history  She did try flecainide in the past and she had significant side effects as well as breakthrough arrhythmia.  right hip replacement on May 03 2011.    Chronic dizziness since the summer of 2014 . Symptoms were worse in December 2014  Echocardiogram showed normal ejection fraction, moderate mitral valve regurgitation, moderate TR, moderately elevated right ventricular systolic pressures.   stress test in March 2011.    Last carotid u/s 40-59% R 60-79%. No neuro symptoms.     Cardiac cath 2006    --non-obstructive CAD 30-40s lesions She reports having repeat catheterization since that time with no significant progression of her  disease   Allergies  Allergen Reactions  . Codeine Nausea And Vomiting  . Morphine And Related Nausea And Vomiting  . Sulfonamide Derivatives Other (See Comments)    REACTION: Dry mouth    Outpatient Encounter Prescriptions as of 12/14/2014  Medication Sig  . acetaminophen (TYLENOL) 500 MG tablet Take 1,000 mg by mouth every 6 (six) hours as needed for headache.  Marland Kitchen atorvastatin (LIPITOR) 80 MG tablet Take 1 tablet (80 mg total) by mouth every evening.  . benzonatate (TESSALON) 200 MG capsule Take 1 capsule (200 mg total) by mouth 2 (two) times daily as needed for cough.  . carvedilol (COREG) 12.5 MG tablet Take 1 tablet (12.5 mg total) by mouth 2 (two) times daily with a meal.  . cetirizine (ZYRTEC) 10 MG tablet Take 10 mg by mouth daily.   . cyanocobalamin (,VITAMIN B-12,) 1000 MCG/ML injection Inject 1,000 mcg into the muscle every 30 (thirty) days.   Marland Kitchen disopyramide (NORPACE) 150 MG capsule Take 1 capsule (150 mg total) by mouth 2 (two) times daily.  . fluticasone (FLONASE) 50 MCG/ACT nasal spray Place 2 sprays into both nostrils daily.  . furosemide (LASIX) 40 MG tablet Take 1 tablet (40 mg total) by mouth 2 (two) times daily.  . potassium chloride (K-DUR,KLOR-CON) 10 MEQ tablet Take 1 tablet (10 mEq total) by mouth 2 (two) times daily with breakfast and lunch.  . warfarin (COUMADIN) 1 MG tablet TAKE AS DIRECTED BY COUMADIN CLINIC  . warfarin (COUMADIN) 2 MG tablet TAKE 1 TABLET BY MOUTH ONCE A DAY   No facility-administered encounter medications on file as  of 12/14/2014.    Past Medical History  Diagnosis Date  . Carotid artery stenosis 09/2007    60-79% bilateral (stable)  . Carotid artery stenosis 10/2008    40-59% R 60-79%   . Chest pain, unspecified   . Atrial fibrillation   . Sinus bradycardia   . Hypotension, unspecified     cardiac cath 2006..nonobstructive CAD 30-40s lesions.Marland KitchenETT 1/09 nondiagnostic due to poor HR response..Right Renal Cancer 2003  . Blood in stool   .  Secondary cardiomyopathy, unspecified   . Chronic airway obstruction, not elsewhere classified   . Long term (current) use of anticoagulants   . Dysuria   . Edema   . Encounter for therapeutic drug monitoring   . Other malaise and fatigue   . Hypertrophic obstructive cardiomyopathy   . Urge incontinence   . Other acute sinusitis   . Other and unspecified coagulation defects   . Other screening mammogram   . Pre-operative cardiovascular examination   . Hemorrhage of rectum and anus   . Chronic kidney disease, unspecified   . Special screening for osteoporosis   . Hypertension 05/20/2011  . PONV (postoperative nausea and vomiting)   . Adjustment disorder with anxiety   . Coronary artery disease     non-obstructive by 2006 cath  . Automatic implantable cardioverter-defibrillator in situ   . High cholesterol   . Anginal pain   . Chronic bronchitis     "get it some; not q year" (11/11/2012)  . Exertional shortness of breath   . History of blood transfusion 04/2011    "after hip OR" (11/11/2012)  . Arthritis     "some in my hands" (11/11/2012)  . Osteoarthritis of right hip   . Renal cancer 06/2001    Right  . Malignant neoplasm of kidney, except pelvis   . Frequent UTI     "get them a couple times/yr" (11/11/2012)  . Chronic diastolic CHF (congestive heart failure) 06/04/2013    Past Surgical History  Procedure Laterality Date  . Abdominal hysterectomy  1975    for benign causes  . Cardiac catheterization  2006  . Nephrectomy Right 06/2001     S/P RENAL CELL CANCER  . Cataract extraction w/ intraocular lens  implant, bilateral  01/2006-02-2006  . Insert / replace / remove pacemaker  05-01-11    02-28-05-/05-04-10-ICD-MEDTRONIC MAXIMAL DR  . Appendectomy    . Total hip arthroplasty Right 05/03/2011    Procedure: TOTAL HIP ARTHROPLASTY ANTERIOR APPROACH;  Surgeon: Mcarthur Rossetti;  Location: [facility];  Service: Orthopedics;  Laterality: Right;  Removal of Cannulated Screws Right  Hip, Right Direct Anterior Hip Replacement  . Laparoscopic cholecystectomy  11/11/2012  . Bi-ventricular pacemaker upgrade  05/04/2010  . Joint replacement    . Cholecystectomy N/A 11/11/2012    Procedure: LAPAROSCOPIC CHOLECYSTECTOMY WITH INTRAOPERATIVE CHOLANGIOGRAM;  Surgeon: Imogene Burn. Georgette Dover, MD;  Location: [place];  Service: General;  Laterality: N/A;  . Laparoscopic lysis of adhesions N/A 11/11/2012    Procedure: LAPAROSCOPIC LYSIS OF ADHESIONS;  Surgeon: Imogene Burn. Georgette Dover, MD;  Location: [place];  Service: General;  Laterality: N/A;    Social History  reports that she quit smoking about 13 years ago. Her smoking use included Cigarettes. She has a 20 pack-year smoking history. She has never used smokeless tobacco. She reports that she does not drink alcohol or use illicit drugs.  Family History family history includes Heart failure in her mother. There is no history of Colon cancer.  Review of Systems  Constitutional: Negative.   Respiratory: Positive for shortness of breath.   Cardiovascular: Negative.   Gastrointestinal: Negative.   Musculoskeletal:       Leg pain  Skin: Negative.   Neurological: Negative.   Hematological: Negative.   Psychiatric/Behavioral: Negative.   All other systems reviewed and are negative.   BP 138/62 mmHg  Pulse 78  Ht 5\' 7"  (1.702 m)  Wt 193 lb (87.544 kg)  BMI 30.22 kg/m2  Physical Exam  Constitutional: She is oriented to person, place, and time. She appears well-developed and well-nourished.  HENT:  Head: Normocephalic.  Nose: Nose normal.  Mouth/Throat: Oropharynx is clear and moist.  Eyes: Conjunctivae are normal. Pupils are equal, round, and reactive to light.  Neck: Normal range of motion. Neck supple. No JVD present.  Cardiovascular: Normal rate, regular rhythm, S1 normal, S2 normal and intact distal pulses.  Exam reveals no gallop and no friction rub.   Murmur heard.  Systolic murmur is present with a grade of 2/6   Pulmonary/Chest: Effort normal and breath sounds normal. No respiratory distress. She has no wheezes. She has no rales. She exhibits no tenderness.  Abdominal: Soft. Bowel sounds are normal. She exhibits no distension. There is no tenderness.  Musculoskeletal: Normal range of motion. She exhibits no edema or tenderness.  Lymphadenopathy:    She has no cervical adenopathy.  Neurological: She is alert and oriented to person, place, and time. Coordination normal.  Skin: Skin is warm and dry. No rash noted. No erythema.  Psychiatric: She has a normal mood and affect. Her behavior is normal. Judgment and thought content normal.    Assessment and Plan  Nursing note and vitals reviewed.             Patient ID: VASUDHA MARINACCIO, female    DOB: 15-May-1933, 81 y.o.   MRN: [id number]  HPI Comments: [name] is a 81 year old woman with a history of syncope, COPD, as well as symptomatic bradycardia.  She is status post ICD.  history of nonobstructive coronary artery disease with chronic chest pain, bilateral carotid artery stenosis 60-70% disease on the left, paroxysmal atrial fibrillation, renal cell carcinoma status post resection of her right kidney with residual chronic renal insufficiency, and COPD.   She has chronic leg pain She presents for routine follow-up of her carotid arterial disease, atrial fibrillation.  Overall she reports that she is doing well. Recently seen by Dr. Ronalee Belts, told that she had blockages in her legs. Unable to do angiogram secondary to renal dysfunction. She reports very little atrial fibrillation when she takes Norpace 150 mg twice a day. Recently she has been having trouble getting this medication from her new insurance company. She did try flecainide in the past and she had significant side effects as well as breakthrough arrhythmia. She is tolerating her cholesterol medication. No recent lab work in 3 years. She is overdue for carotid ultrasound She does  not do any regular exercise, she is concerned about her weight.  Prior ABI/lower extremity Doppler may 2013 did not show significant PAD. She has tried to hold her Lipitor with no improvement of her leg pain. No recent episodes of near syncope or syncope. Takes her Lasix but not consistently.  EKG on today's visit shows AV paced rhythm, rate 66 bpm   Other past medical history  right hip replacement on May 03 2011.    Chronic dizziness since the summer of 2014 . Symptoms were worse in December 2014  Echocardiogram showed normal ejection fraction, moderate mitral valve regurgitation, moderate TR, moderately elevated right ventricular systolic pressures.   stress test in March 2011.    Last carotid u/s 40-59% R 60-79%. No neuro symptoms.     Cardiac cath 2006    --non-obstructive CAD 30-40s lesions She reports having repeat catheterization since that time with no significant progression of her disease   Allergies  Allergen Reactions  . Codeine Nausea And Vomiting  . Morphine And Related Nausea And Vomiting  . Sulfonamide Derivatives Other (See Comments)    REACTION: Dry mouth    Outpatient Encounter Prescriptions as of 12/14/2014  Medication Sig  . acetaminophen (TYLENOL) 500 MG tablet Take 1,000 mg by mouth every 6 (six) hours as needed for headache.  Marland Kitchen atorvastatin (LIPITOR) 80 MG tablet Take 1 tablet (80 mg total) by mouth every evening.  . benzonatate (TESSALON) 200 MG capsule Take 1 capsule (200 mg total) by mouth 2 (two) times daily as needed for cough.  . carvedilol (COREG) 12.5 MG tablet Take 1 tablet (12.5 mg total) by mouth 2 (two) times daily with a meal.  . cetirizine (ZYRTEC) 10 MG tablet Take 10 mg by mouth daily.   . cyanocobalamin (,VITAMIN B-12,) 1000 MCG/ML injection Inject 1,000 mcg into the muscle every 30 (thirty) days.   Marland Kitchen disopyramide (NORPACE) 150 MG capsule Take 1 capsule (150 mg total) by mouth 2 (two) times daily.  . fluticasone (FLONASE) 50 MCG/ACT  nasal spray Place 2 sprays into both nostrils daily.  . furosemide (LASIX) 40 MG tablet Take 1 tablet (40 mg total) by mouth 2 (two) times daily.  . potassium chloride (K-DUR,KLOR-CON) 10 MEQ tablet Take 1 tablet (10 mEq total) by mouth 2 (two) times daily with breakfast and lunch.  . warfarin (COUMADIN) 1 MG tablet TAKE AS DIRECTED BY COUMADIN CLINIC  . warfarin (COUMADIN) 2 MG tablet TAKE 1 TABLET BY MOUTH ONCE A DAY  . [DISCONTINUED] atorvastatin (LIPITOR) 80 MG tablet Take 1 tablet (80 mg total) by mouth every evening.   No facility-administered encounter medications on file as of 12/14/2014.    Past Medical History  Diagnosis Date  . Carotid artery stenosis 09/2007    60-79% bilateral (stable)  . Carotid artery stenosis 10/2008    40-59% R 60-79%   . Chest pain, unspecified   . Atrial fibrillation   . Sinus bradycardia   . Hypotension, unspecified     cardiac cath 2006..nonobstructive CAD 30-40s lesions.Marland KitchenETT 1/09 nondiagnostic due to poor HR response..Right Renal Cancer 2003  . Blood in stool   . Secondary cardiomyopathy, unspecified   . Chronic airway obstruction, not elsewhere classified   . Long term (current) use of anticoagulants   . Dysuria   . Edema   . Encounter for therapeutic drug monitoring   . Other malaise and fatigue   . Hypertrophic obstructive cardiomyopathy   . Urge incontinence   . Other acute sinusitis   . Other and unspecified coagulation defects   . Other screening mammogram   . Pre-operative cardiovascular examination   . Hemorrhage of rectum and anus   . Chronic kidney disease, unspecified   . Special screening for osteoporosis   . Hypertension 05/20/2011  . PONV (postoperative nausea and vomiting)   . Adjustment disorder with anxiety   . Coronary artery disease     non-obstructive by 2006 cath  . Automatic implantable cardioverter-defibrillator in situ   . High cholesterol   . Anginal pain   . Chronic bronchitis     "  get it some; not q year"  (11/11/2012)  . Exertional shortness of breath   . History of blood transfusion 04/2011    "after hip OR" (11/11/2012)  . Arthritis     "some in my hands" (11/11/2012)  . Osteoarthritis of right hip   . Renal cancer 06/2001    Right  . Malignant neoplasm of kidney, except pelvis   . Frequent UTI     "get them a couple times/yr" (11/11/2012)  . Chronic diastolic CHF (congestive heart failure) 06/04/2013    Past Surgical History  Procedure Laterality Date  . Abdominal hysterectomy  1975    for benign causes  . Cardiac catheterization  2006  . Nephrectomy Right 06/2001     S/P RENAL CELL CANCER  . Cataract extraction w/ intraocular lens  implant, bilateral  01/2006-02-2006  . Insert / replace / remove pacemaker  05-01-11    02-28-05-/05-04-10-ICD-MEDTRONIC MAXIMAL DR  . Appendectomy    . Total hip arthroplasty Right 05/03/2011    Procedure: TOTAL HIP ARTHROPLASTY ANTERIOR APPROACH;  Surgeon: Mcarthur Rossetti;  Location: [facility];  Service: Orthopedics;  Laterality: Right;  Removal of Cannulated Screws Right Hip, Right Direct Anterior Hip Replacement  . Laparoscopic cholecystectomy  11/11/2012  . Bi-ventricular pacemaker upgrade  05/04/2010  . Joint replacement    . Cholecystectomy N/A 11/11/2012    Procedure: LAPAROSCOPIC CHOLECYSTECTOMY WITH INTRAOPERATIVE CHOLANGIOGRAM;  Surgeon: Imogene Burn. Georgette Dover, MD;  Location: [place];  Service: General;  Laterality: N/A;  . Laparoscopic lysis of adhesions N/A 11/11/2012    Procedure: LAPAROSCOPIC LYSIS OF ADHESIONS;  Surgeon: Imogene Burn. Georgette Dover, MD;  Location: [place];  Service: General;  Laterality: N/A;    Social History  reports that she quit smoking about 13 years ago. Her smoking use included Cigarettes. She has a 20 pack-year smoking history. She has never used smokeless tobacco. She reports that she does not drink alcohol or use illicit drugs.  Family History family history includes Heart failure in her mother. There is no history of Colon  cancer.       Review of Systems  Constitutional: Negative.   Respiratory: Positive for shortness of breath.   Cardiovascular: Negative.   Gastrointestinal: Negative.   Musculoskeletal:       Leg pain  Skin: Negative.   Neurological: Negative.   Hematological: Negative.   Psychiatric/Behavioral: Negative.   All other systems reviewed and are negative.   BP 138/62 mmHg  Pulse 78  Ht 5\' 7"  (1.702 m)  Wt 193 lb (87.544 kg)  BMI 30.22 kg/m2  Physical Exam  Constitutional: She is oriented to person, place, and time. She appears well-developed and well-nourished.  HENT:  Head: Normocephalic.  Nose: Nose normal.  Mouth/Throat: Oropharynx is clear and moist.  Eyes: Conjunctivae are normal. Pupils are equal, round, and reactive to light.  Neck: Normal range of motion. Neck supple. No JVD present.  Cardiovascular: Normal rate, regular rhythm, S1 normal, S2 normal and intact distal pulses.  Exam reveals no gallop and no friction rub.   Murmur heard.  Systolic murmur is present with a grade of 2/6  Pulmonary/Chest: Effort normal and breath sounds normal. No respiratory distress. She has no wheezes. She has no rales. She exhibits no tenderness.  Abdominal: Soft. Bowel sounds are normal. She exhibits no distension. There is no tenderness.  Musculoskeletal: Normal range of motion. She exhibits no edema or tenderness.  Lymphadenopathy:    She has no cervical adenopathy.  Neurological: She is alert and oriented  to person, place, and time. Coordination normal.  Skin: Skin is warm and dry. No rash noted. No erythema.  Psychiatric: She has a normal mood and affect. Her behavior is normal. Judgment and thought content normal.    Assessment and Plan  Nursing note and vitals reviewed.

## 2014-12-14 NOTE — Assessment & Plan Note (Signed)
Long smoking history, chronic shortness of breath. Relatively inactive, deconditioned also with leg pain on exertion. Recommended she start pulmonary rehabilitation for shortness of breath and COPD.

## 2014-12-14 NOTE — Assessment & Plan Note (Signed)
Blood pressure is well controlled on today's visit. No changes made to the medications. 

## 2014-12-14 NOTE — Assessment & Plan Note (Signed)
Currently on Lasix twice per day, followed by nephrology. Stable renal function Clinically appears euvolemic

## 2014-12-14 NOTE — Assessment & Plan Note (Signed)
Currently on warfarin. We'll continue Norpace

## 2014-12-14 NOTE — Assessment & Plan Note (Signed)
Dramatic improvement in her cholesterol on Lipitor 40 mg daily. She reports that she was missing some doses as well. Recommended she stay faithful on her Lipitor daily. Goal LDL less than 70

## 2014-12-14 NOTE — Assessment & Plan Note (Signed)
We have recommended a regular exercise program as tolerated. We will start pulmonary rehabilitation given her shortness of breath, COPD, chronic leg pain, likely claudication

## 2014-12-14 NOTE — Patient Instructions (Addendum)
You are doing well. No medication changes were made.  We will try to obtain the lower extremity arterial doppler from Dr. Ronalee Belts  We will send an order for pulmonary rehab for shortness of breath and COPD  Please call us if you have new issues that need to be addressed before your next appt.  Your physician wants you to follow-up in: 6 months.  You will receive a reminder letter in the mail two months in advance. If you don't receive a letter, please call our office to schedule the follow-up appointment.

## 2014-12-14 NOTE — Assessment & Plan Note (Signed)
Carotid stenosis, 60-70% She has stopped smoking, now taking her Lipitor faithfully Repeat carotid ultrasound on an annual basis

## 2014-12-19 ENCOUNTER — Ambulatory Visit (INDEPENDENT_AMBULATORY_CARE_PROVIDER_SITE_OTHER): Payer: PPO | Admitting: *Deleted

## 2014-12-19 DIAGNOSIS — I4891 Unspecified atrial fibrillation: Secondary | ICD-10-CM | POA: Diagnosis not present

## 2014-12-19 DIAGNOSIS — Z5181 Encounter for therapeutic drug level monitoring: Secondary | ICD-10-CM

## 2014-12-19 DIAGNOSIS — Z7901 Long term (current) use of anticoagulants: Secondary | ICD-10-CM | POA: Diagnosis not present

## 2014-12-19 DIAGNOSIS — Z7902 Long term (current) use of antithrombotics/antiplatelets: Secondary | ICD-10-CM | POA: Diagnosis not present

## 2014-12-19 LAB — POCT INR: INR: 2.2

## 2014-12-19 NOTE — Progress Notes (Signed)
Pre visit review using our clinic review tool, if applicable. No additional management support is needed unless otherwise documented below in the visit note. 

## 2015-01-10 ENCOUNTER — Other Ambulatory Visit: Payer: Self-pay

## 2015-01-10 MED ORDER — FUROSEMIDE 40 MG PO TABS: 40.0000 mg | ORAL_TABLET | Freq: Two times a day (BID) | ORAL | Status: DC

## 2015-01-16 ENCOUNTER — Other Ambulatory Visit: Payer: Self-pay | Admitting: Family Medicine

## 2015-01-17 DIAGNOSIS — L570 Actinic keratosis: Secondary | ICD-10-CM

## 2015-01-17 DIAGNOSIS — C4492 Squamous cell carcinoma of skin, unspecified: Secondary | ICD-10-CM

## 2015-01-17 HISTORY — DX: Actinic keratosis: L57.0

## 2015-01-17 HISTORY — DX: Squamous cell carcinoma of skin, unspecified: C44.92

## 2015-01-19 ENCOUNTER — Ambulatory Visit (INDEPENDENT_AMBULATORY_CARE_PROVIDER_SITE_OTHER): Payer: PPO | Admitting: *Deleted

## 2015-01-19 DIAGNOSIS — E538 Deficiency of other specified B group vitamins: Secondary | ICD-10-CM | POA: Diagnosis not present

## 2015-01-19 DIAGNOSIS — Z7902 Long term (current) use of antithrombotics/antiplatelets: Secondary | ICD-10-CM

## 2015-01-19 DIAGNOSIS — Z7901 Long term (current) use of anticoagulants: Secondary | ICD-10-CM | POA: Diagnosis not present

## 2015-01-19 DIAGNOSIS — Z5181 Encounter for therapeutic drug level monitoring: Secondary | ICD-10-CM

## 2015-01-19 DIAGNOSIS — I4891 Unspecified atrial fibrillation: Secondary | ICD-10-CM

## 2015-01-19 LAB — POCT INR: INR: 1.9

## 2015-01-19 MED ORDER — CYANOCOBALAMIN 1000 MCG/ML IJ SOLN
1000.0000 ug | Freq: Once | INTRAMUSCULAR | Status: AC
  Administered 2015-01-19: 1000 ug via INTRAMUSCULAR

## 2015-01-19 NOTE — Progress Notes (Signed)
Pre visit review using our clinic review tool, if applicable. No additional management support is needed unless otherwise documented below in the visit note. 

## 2015-01-25 ENCOUNTER — Encounter: Payer: PPO | Admitting: Family Medicine

## 2015-02-08 ENCOUNTER — Other Ambulatory Visit: Payer: Self-pay | Admitting: Cardiovascular Disease

## 2015-02-09 ENCOUNTER — Encounter: Payer: PPO | Admitting: Family Medicine

## 2015-02-16 ENCOUNTER — Ambulatory Visit: Payer: PPO

## 2015-02-16 ENCOUNTER — Telehealth: Payer: Self-pay | Admitting: *Deleted

## 2015-02-16 NOTE — Telephone Encounter (Signed)
Patient did not show for Coumadin clinic appointment 10/13 at 1115.  Attempted to call patient to reschedule.  Home number busy x3 attempts.  No voice mail on cell phone.

## 2015-02-17 NOTE — Telephone Encounter (Signed)
Coumadin clinic appointment rescheduled for 10/17 at 1130.  Will plan to administer B12 injection and flu vaccine at that time.

## 2015-02-20 ENCOUNTER — Ambulatory Visit (INDEPENDENT_AMBULATORY_CARE_PROVIDER_SITE_OTHER): Payer: PPO | Admitting: *Deleted

## 2015-02-20 DIAGNOSIS — Z7902 Long term (current) use of antithrombotics/antiplatelets: Secondary | ICD-10-CM

## 2015-02-20 DIAGNOSIS — Z23 Encounter for immunization: Secondary | ICD-10-CM | POA: Diagnosis not present

## 2015-02-20 DIAGNOSIS — E538 Deficiency of other specified B group vitamins: Secondary | ICD-10-CM | POA: Diagnosis not present

## 2015-02-20 DIAGNOSIS — Z5181 Encounter for therapeutic drug level monitoring: Secondary | ICD-10-CM

## 2015-02-20 DIAGNOSIS — Z7901 Long term (current) use of anticoagulants: Secondary | ICD-10-CM

## 2015-02-20 DIAGNOSIS — I4891 Unspecified atrial fibrillation: Secondary | ICD-10-CM

## 2015-02-20 LAB — POCT INR: INR: 2

## 2015-02-20 MED ORDER — CYANOCOBALAMIN 1000 MCG/ML IJ SOLN
1000.0000 ug | Freq: Once | INTRAMUSCULAR | Status: AC
  Administered 2015-02-20: 1000 ug via INTRAMUSCULAR

## 2015-02-20 NOTE — Progress Notes (Signed)
Pre visit review using our clinic review tool, if applicable. No additional management support is needed unless otherwise documented below in the visit note. 

## 2015-02-22 ENCOUNTER — Telehealth: Payer: Self-pay

## 2015-02-22 ENCOUNTER — Ambulatory Visit (INDEPENDENT_AMBULATORY_CARE_PROVIDER_SITE_OTHER): Payer: PPO | Admitting: Internal Medicine

## 2015-02-22 ENCOUNTER — Encounter: Payer: Self-pay | Admitting: Internal Medicine

## 2015-02-22 VITALS — BP 134/68 | HR 65 | Temp 98.0°F | Wt 194.0 lb

## 2015-02-22 DIAGNOSIS — N644 Mastodynia: Secondary | ICD-10-CM | POA: Diagnosis not present

## 2015-02-22 NOTE — Telephone Encounter (Signed)
Spoke w/ pt.  She reports that last night she developed a sharp pain behind her left breast that is not relieved by rest or position change.  She describes the pain as "fluctuating". She denies SOB, n/v, sweating, or radiation of pain. Pt reports picking up a case of water recently, but does not feel that she pulled a muscle.  She had her flu shot in her left arm yesterday. She feels that the pain is actually in her left breast tissue.  Pt is overdue for mammo (last on 11/19/13), she does not perform regular self breast exams. Also reports that skin on her left breast has been itchy for some time.  She is concerned that pain may be from pacemaker. She is sched for CPE w/ Dr. Deborra Medina on 10/31 and pacer check w/ Dr. Caryl Comes the next day on 11/1. Advised her to call the [place] office and her PCP to see if her appts can be moved up. Asked her to call back if we can be of further assistance.

## 2015-02-22 NOTE — Telephone Encounter (Signed)
Pt c/o of Chest Pain: STAT if CP now or developed within 24 hours  1. Are you having CP right now?  yes  2. Are you experiencing any other symptoms (ex. SOB, nausea, vomiting, sweating)? Pain in left breast, feels like it is in her breast  3. How long have you been experiencing CP? Last night before bed, states it woke her up this morning  4. Is your CP continuous or coming and going? Coming and going  5. Have you taken Nitroglycerin? no ?

## 2015-02-22 NOTE — Patient Instructions (Signed)
Breast Scan Breast scan is procedure done to examine dense breast tissue, which is difficult in a normal mammogram. It is used in women with breast lesions from fibrocystic disease, fibroadenoma, and fat necrosis. It also is used to determine the course of treatment for breast cancer. LET [facility] CARE PROVIDER KNOW ABOUT:  Any allergies you have.  All medicines you are taking, including vitamins, herbs, eye drops, creams, and over-the-counter medicines.  Previous problems you or members of your family have had with the use of anesthetics.  Any blood disorders you have.  Previous surgeries you have had.  Medical conditions you have.  Pregnancy or the possibility that you may be pregnant. RISKS AND COMPLICATIONS Generally, this is a safe procedure. However, as with any procedure, complications can occur. Possible complications include:   Slight discomfort from injection of radioactive substance.  Allergic reaction to contrast or radioactive substance used in exam. BEFORE THE PROCEDURE No fasting or sedation is required. PROCEDURE   You will be asked to remove all jewelry and clothing from the waist up.  An IV tube will be inserted in your arm or hand opposite the side of the breast to be examined. If both breasts are being evaluated, the IV tube may be inserted into a vein in the foot.  You will be positioned face down on a table. The breast to be imaged will be placed through an opening in the table.  The radioactive agent will be injected into the IV tube. You may experience a slight metallic taste after the injection.  Imaging will begin a few minutes after the injection. A scanner will be placed over the breast and will record the radiation given off.  You may also be asked to get into different positions during the scan.  When the scan is complete, the IV tube is removed. AFTER THE PROCEDURE  You will be asked to get up slowly from the scanner to avoid  light-headedness from lying flat during the procedure.  Drink plenty of fluids to help flush the remaining radioactive agent from your body.   This information is not intended to replace advice given to you by your health care provider. Make sure you discuss any questions you have with your health care provider.   Document Released: 05/17/2004 Document Revised: 04/27/2013 Document Reviewed: 12/28/2012 Elsevier Interactive Patient Education [employer].

## 2015-02-22 NOTE — Progress Notes (Signed)
Subjective:    Patient ID: [name], female    DOB: 10-02-1933, 81 y.o.   MRN: [id number]  HPI  Pt presents to the clinic today with c/o pain in her left breast. She reports this started last night. The pain did wake her up out of her sleep. She describes the pain as sharp and achy. Her left breast is tender to touch. She has not noticed any rash or lesions. She denies discharge from the nipple. Her breast is itchy but she reports it has been that way for the last year. Her last mammogram was 11/2013 and was normal. She does have a history of afib, cardiomyopathy, CHF and HTN. She did call Dr. Philmore Pali, her cardiologist. She denied chest pain or shortness of breath. The symptoms did not appear to be cardiac, so she was advised to follow up with her PCP.  Review of Systems      Past Medical History  Diagnosis Date  . Carotid artery stenosis 09/2007    60-79% bilateral (stable)  . Carotid artery stenosis 10/2008    40-59% R 60-79%   . Chest pain, unspecified   . Atrial fibrillation ([place])   . Sinus bradycardia   . Hypotension, unspecified     cardiac cath 2006..nonobstructive CAD 30-40s lesions.Marland KitchenETT 1/09 nondiagnostic due to poor HR response..Right Renal Cancer 2003  . Blood in stool   . Secondary cardiomyopathy, unspecified   . Chronic airway obstruction, not elsewhere classified   . Long term (current) use of anticoagulants   . Dysuria   . Edema   . Encounter for therapeutic drug monitoring   . Other malaise and fatigue   . Hypertrophic obstructive cardiomyopathy   . Urge incontinence   . Other acute sinusitis   . Other and unspecified coagulation defects   . Other screening mammogram   . Pre-operative cardiovascular examination   . Hemorrhage of rectum and anus   . Chronic kidney disease, unspecified ([place])   . Special screening for osteoporosis   . Hypertension 05/20/2011  . PONV (postoperative nausea and vomiting)   . Adjustment disorder with anxiety   . Coronary  artery disease     non-obstructive by 2006 cath  . Automatic implantable cardioverter-defibrillator in situ   . High cholesterol   . Anginal pain ([place])   . Chronic bronchitis ([place])     "get it some; not q year" (11/11/2012)  . Exertional shortness of breath   . History of blood transfusion 04/2011    "after hip OR" (11/11/2012)  . Arthritis     "some in my hands" (11/11/2012)  . Osteoarthritis of right hip   . Renal cancer ([place]) 06/2001    Right  . Malignant neoplasm of kidney, except pelvis   . Frequent UTI     "get them a couple times/yr" (11/11/2012)  . Chronic diastolic CHF (congestive heart failure) ([place]) 06/04/2013    Current Outpatient Prescriptions  Medication Sig Dispense Refill  . acetaminophen (TYLENOL) 500 MG tablet Take 1,000 mg by mouth every 6 (six) hours as needed for headache.    Marland Kitchen atorvastatin (LIPITOR) 80 MG tablet Take 1 tablet (80 mg total) by mouth every evening. 90 tablet 3  . benzonatate (TESSALON) 200 MG capsule Take 1 capsule (200 mg total) by mouth 2 (two) times daily as needed for cough. 20 capsule 0  . carvedilol (COREG) 12.5 MG tablet TAKE 1 TABLET BY MOUTH TWICE A DAY WITH MEAL 60 tablet 3  . cetirizine (ZYRTEC) 10  MG tablet Take 10 mg by mouth daily.     . cyanocobalamin (,VITAMIN B-12,) 1000 MCG/ML injection Inject 1,000 mcg into the muscle every 30 (thirty) days.     Marland Kitchen disopyramide (NORPACE) 150 MG capsule TAKE 1 CAPSULE BY MOUTH TWICE DAILY 60 capsule 3  . fluticasone (FLONASE) 50 MCG/ACT nasal spray Place 2 sprays into both nostrils daily. 16 g 2  . furosemide (LASIX) 40 MG tablet Take 1 tablet (40 mg total) by mouth 2 (two) times daily. 60 tablet 3  . potassium chloride (K-DUR,KLOR-CON) 10 MEQ tablet Take 1 tablet (10 mEq total) by mouth 2 (two) times daily with breakfast and lunch. 60 tablet 6  . warfarin (COUMADIN) 1 MG tablet TAKE AS DIRECTED BY COUMADIN CLINIC 30 tablet 0  . warfarin (COUMADIN) 2 MG tablet TAKE 1 TABLET BY MOUTH ONCE A DAY 30 tablet 2    . [DISCONTINUED] hydrochlorothiazide (HYDRODIURIL) 25 MG tablet Take 25 mg by mouth daily after breakfast.      No current facility-administered medications for this visit.    Allergies  Allergen Reactions  . Codeine Nausea And Vomiting  . Morphine And Related Nausea And Vomiting  . Sulfonamide Derivatives Other (See Comments)    REACTION: Dry mouth    Family History  Problem Relation Age of Onset  . Colon cancer Neg Hx   . Heart failure Mother     Social History   Social History  . Marital Status: Widowed    Spouse Name: N/A  . Number of Children: N/A  . Years of Education: N/A   Occupational History  . Retired    Social History Main Topics  . Smoking status: Former Smoker -- 0.50 packs/day for 40 years    Types: Cigarettes    Quit date: 05/06/2001  . Smokeless tobacco: Never Used  . Alcohol Use: No  . Drug Use: No  . Sexual Activity: No   Other Topics Concern  . 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   ALCOHOL USE-NO   RETIRED   FORMER TOBACCO USE.....1/2 PACK X 40 YRS UNTIL 2003     Constitutional: Denies fever, malaise, fatigue, headache or abrupt weight changes.  Respiratory: Denies difficulty breathing, shortness of breath, cough or sputum production.   Cardiovascular: Denies chest pain, chest tightness, palpitations or swelling in the hands or feet.  Gastrointestinal: Denies abdominal pain, bloating, constipation, diarrhea or blood in the stool.  Musculoskeletal: Pt reports left breast pain. Denies decrease in range of motion, difficulty with gait, muscle pain or joint pain and swelling.  Skin: Denies redness, rashes, lesions or ulcercations.    No other specific complaints in a complete review of systems (except as listed in HPI above).  Objective:   Physical Exam   BP 134/68  mmHg  Pulse 65  Temp(Src) 98 F (36.7 C) (Oral)  Wt 194 lb (87.998 kg)  SpO2 98% Wt Readings from Last 3 Encounters:  02/22/15 194 lb (87.998 kg)  12/14/14 193 lb (87.544 kg)  10/05/14 194 lb 12 oz (88.338 kg)    General: Appears her stated age, well developed, well nourished in NAD. Skin: Warm, dry and intact. No rashes or redness noted of the left breast. Cardiovascular: Normal rate and rhythm. Distant S1,S2 noted. Pacemaker noted in left upper chest. Pulmonary/Chest:  Normal effort and diminshed vesicular breath sounds. No respiratory distress. No wheezes, rales or ronchi noted.  Musculoskeletal: .Breast are symmetrical. No dimpling or skin changes noted of the left breast. Mild fibrocystic changes noted of the left breast. No discrete mass noted. She does have tenderness at the 7 oclock position. Not able to express any discharge from the nipple. Neurological: Alert and oriented.    BMET    Component Value Date/Time   NA 139 08/31/2014 1200   NA 138 02/10/2012 0000   K 3.9 08/31/2014 1200   CL 102 08/31/2014 1200   CO2 32 08/31/2014 1200   GLUCOSE 81 08/31/2014 1200   GLUCOSE 93 02/10/2012 0000   BUN 18 08/31/2014 1200   BUN 22 02/10/2012 0000   CREATININE 1.47* 08/31/2014 1200   CREATININE 1.34* 03/19/2011 0908   CALCIUM 9.3 08/31/2014 1200   GFRNONAA 34* 08/17/2014 0449   GFRAA 39* 08/17/2014 0449    Lipid Panel     Component Value Date/Time   CHOL 160 08/17/2014 0449   CHOL 271* 06/09/2014 1317   TRIG 158* 08/17/2014 0449   TRIG 241* 06/09/2014 1317   HDL 31* 08/17/2014 0449   HDL 40 06/09/2014 1317   CHOLHDL 5.2 08/17/2014 0449   VLDL 32 08/17/2014 0449   VLDL 48* 06/09/2014 1317   LDLCALC 97 08/17/2014 0449   LDLCALC 183* 06/09/2014 1317    CBC    Component Value Date/Time   WBC 4.1 08/17/2014 0449   WBC 4.3 02/10/2012 0000   RBC 3.71* 08/17/2014 0449   RBC 4.24 02/10/2012 0000   HGB 10.2* 08/17/2014 0449   HCT 32.0* 08/17/2014 0449   PLT 155  08/17/2014 0449   MCV 86.3 08/17/2014 0449   MCH 27.5 08/17/2014 0449   MCH 29.2 02/10/2012 0000   MCHC 31.9 08/17/2014 0449   MCHC 32.9 02/10/2012 0000   RDW 16.2* 08/17/2014 0449   RDW 15.3 02/10/2012 0000   LYMPHSABS 1.3 08/16/2014 1737   LYMPHSABS 1.4 02/10/2012 0000   MONOABS 0.6 08/16/2014 1737   EOSABS 0.1 08/16/2014 1737   EOSABS 0.2 02/10/2012 0000   BASOSABS 0.0 08/16/2014 1737   BASOSABS 0.0 02/10/2012 0000    Hgb A1C Lab Results  Component Value Date   HGBA1C 6.1* 08/17/2014        Assessment & Plan:   Left breast pain:  Exam is reassuring Does not appear to be cardiac in origin Will obtain diagnostic mammogram and ultrasound of left breast- see Rosaria Ferries on the way out Take Tylenol as needed for pain  Will follow up after imaging is back. Advised her if she develops chest pain, chest tightness or shortness of breath, she should go to the ER immediately.

## 2015-02-22 NOTE — Progress Notes (Signed)
Pre visit review using our clinic review tool, if applicable. No additional management support is needed unless otherwise documented below in the visit note. 

## 2015-02-22 NOTE — Addendum Note (Signed)
Addended by: Jearld Fenton on: 02/22/2015 02:14 PM   Modules accepted: Orders

## 2015-03-01 ENCOUNTER — Other Ambulatory Visit: Payer: PPO

## 2015-03-01 ENCOUNTER — Ambulatory Visit: Payer: PPO

## 2015-03-06 ENCOUNTER — Ambulatory Visit (INDEPENDENT_AMBULATORY_CARE_PROVIDER_SITE_OTHER): Payer: PPO | Admitting: Family Medicine

## 2015-03-06 ENCOUNTER — Encounter: Payer: Self-pay | Admitting: Family Medicine

## 2015-03-06 VITALS — BP 134/74 | HR 70 | Temp 97.6°F | Ht 65.5 in | Wt 196.5 lb

## 2015-03-06 DIAGNOSIS — R748 Abnormal levels of other serum enzymes: Secondary | ICD-10-CM

## 2015-03-06 DIAGNOSIS — I739 Peripheral vascular disease, unspecified: Secondary | ICD-10-CM

## 2015-03-06 DIAGNOSIS — N184 Chronic kidney disease, stage 4 (severe): Secondary | ICD-10-CM

## 2015-03-06 DIAGNOSIS — R300 Dysuria: Secondary | ICD-10-CM | POA: Diagnosis not present

## 2015-03-06 DIAGNOSIS — Z23 Encounter for immunization: Secondary | ICD-10-CM

## 2015-03-06 DIAGNOSIS — Z0000 Encounter for general adult medical examination without abnormal findings: Secondary | ICD-10-CM

## 2015-03-06 DIAGNOSIS — E785 Hyperlipidemia, unspecified: Secondary | ICD-10-CM

## 2015-03-06 DIAGNOSIS — J432 Centrilobular emphysema: Secondary | ICD-10-CM

## 2015-03-06 DIAGNOSIS — C649 Malignant neoplasm of unspecified kidney, except renal pelvis: Secondary | ICD-10-CM | POA: Diagnosis not present

## 2015-03-06 DIAGNOSIS — G458 Other transient cerebral ischemic attacks and related syndromes: Secondary | ICD-10-CM

## 2015-03-06 DIAGNOSIS — I10 Essential (primary) hypertension: Secondary | ICD-10-CM

## 2015-03-06 DIAGNOSIS — I340 Nonrheumatic mitral (valve) insufficiency: Secondary | ICD-10-CM

## 2015-03-06 DIAGNOSIS — R7989 Other specified abnormal findings of blood chemistry: Secondary | ICD-10-CM

## 2015-03-06 DIAGNOSIS — I6523 Occlusion and stenosis of bilateral carotid arteries: Secondary | ICD-10-CM

## 2015-03-06 DIAGNOSIS — I4891 Unspecified atrial fibrillation: Secondary | ICD-10-CM

## 2015-03-06 DIAGNOSIS — I5032 Chronic diastolic (congestive) heart failure: Secondary | ICD-10-CM

## 2015-03-06 DIAGNOSIS — Z905 Acquired absence of kidney: Secondary | ICD-10-CM

## 2015-03-06 LAB — POCT URINALYSIS DIPSTICK
Bilirubin, UA: NEGATIVE
Glucose, UA: NEGATIVE
Ketones, UA: NEGATIVE
Nitrite, UA: NEGATIVE
Protein, UA: NEGATIVE
Spec Grav, UA: 1.015
Urobilinogen, UA: 0.2
pH, UA: 6

## 2015-03-06 LAB — TSH: TSH: 1.94 u[IU]/mL (ref 0.35–4.50)

## 2015-03-06 LAB — COMPREHENSIVE METABOLIC PANEL
ALT: 13 U/L (ref 0–35)
AST: 18 U/L (ref 0–37)
Albumin: 4.2 g/dL (ref 3.5–5.2)
Alkaline Phosphatase: 67 U/L (ref 39–117)
BUN: 17 mg/dL (ref 6–23)
CO2: 31 mEq/L (ref 19–32)
Calcium: 9.8 mg/dL (ref 8.4–10.5)
Chloride: 101 mEq/L (ref 96–112)
Creatinine, Ser: 1.41 mg/dL — ABNORMAL HIGH (ref 0.40–1.20)
GFR: 38.24 mL/min — ABNORMAL LOW (ref >60.00)
Glucose, Bld: 72 mg/dL (ref 70–99)
Potassium: 4.2 mEq/L (ref 3.5–5.1)
Sodium: 141 mEq/L (ref 135–145)
Total Bilirubin: 0.5 mg/dL (ref 0.2–1.2)
Total Protein: 7.3 g/dL (ref 6.0–8.3)

## 2015-03-06 LAB — LIPID PANEL
Cholesterol: 203 mg/dL — ABNORMAL HIGH (ref 0–200)
HDL: 39.2 mg/dL (ref >39.00)
NonHDL: 163.44
Total CHOL/HDL Ratio: 5
Triglycerides: 256 mg/dL — ABNORMAL HIGH (ref 0.0–149.0)
VLDL: 51.2 mg/dL — ABNORMAL HIGH (ref 0.0–40.0)

## 2015-03-06 LAB — LDL CHOLESTEROL, DIRECT: Direct LDL: 121 mg/dL

## 2015-03-06 MED ORDER — CEPHALEXIN 500 MG PO CAPS
500.0000 mg | ORAL_CAPSULE | Freq: Two times a day (BID) | ORAL | Status: AC
Start: 2015-03-06 — End: 2015-03-16

## 2015-03-06 NOTE — Assessment & Plan Note (Signed)
Well controlled on current rx. No changes made. 

## 2015-03-06 NOTE — Assessment & Plan Note (Signed)
New- UA pos for LE and RBCs. Classic symptoms of cystitis. Keflex 500 mg twice daily x 7 days, keep appt for INR check. Send urine for cx.

## 2015-03-06 NOTE — Progress Notes (Signed)
Pre visit review using our clinic review tool, if applicable. No additional management support is needed unless otherwise documented below in the visit note. 

## 2015-03-06 NOTE — Assessment & Plan Note (Signed)
The patients weight, height, BMI and visual acuity have been recorded in the chart.  Cognitive function assessed.   I have made referrals, counseling and provided education to the patient based review of the above and I have provided the pt with a written personalized care plan for preventive services.  Mammogram scheduled.  prevnar 13 and labs today  Orders Placed This Encounter  Procedures  . Urine culture  . Comprehensive metabolic panel  . Lipid panel  . TSH  . Urinalysis Dipstick

## 2015-03-06 NOTE — Assessment & Plan Note (Signed)
Continue current dose of statin. Check labs today.

## 2015-03-06 NOTE — Patient Instructions (Addendum)
Please take antibiotic as directed- keflex 500 mg twice daily x 7 days. Keep your INR appointment.   Keep mammogram appointment.

## 2015-03-06 NOTE — Progress Notes (Signed)
Subjective:   Patient ID: [name], female    DOB: 1933/09/25, 81 y.o.   MRN: [id number]  [name] is a pleasant 81 y.o. year old female who presents to clinic today with Annual Exam and Dysuria  and follow up of chronic medical conditions on 03/06/2015  HPI: [name] is a 81 year old woman with a history of syncope, COPD, CAD,carotid stenosis, afib on coumadin, CDK, as well as symptomatic bradycardia status post ICD.  I have personally reviewed the Medicare Annual Wellness questionnaire and have noted 1. The patient's medical and social history 2. Their use of alcohol, tobacco or illicit drugs 3. Their current medications and supplements 4. The patient's functional ability including ADL's, fall risks, home safety risks and hearing or visual             impairment. 5. Diet and physical activities 6. Evidence for depression or mood disorders  End of life wishes discussed and updated in Social History.  The roster of all physicians providing medical care to patient - is listed in the CareTeams section of the chart.  Colonoscopy 11/02/09- Dr. Ardis Hughs Zoster 04/10/09 Tdap 08/07/10 Pneumovax 04/21/2001 Influenza vaccine 02/20/15 Mammogram 11/19/13- mammogram scheduled for 11/10 Remote h/o hysterectomy.  Dysuria- 5 days of worsening dysuria.  No back pain.  No nausea, vomiting or fever.  Chronic intermittent CP, afib on coumadin, bilateral carotid stenosis, and symptomatic bradycardia - followed by Dr. Rockey Situ and Dr. Caryl Comes. Last saw Dr. Rockey Situ on 12/14/14- note reviewed. No changes made to rxs.   Carotid dopplers yearly, continue Lipitor 40 mg daily, lasix 40 mg twice daily, Norpace and coumadin Has been having some intermittent CP, has spoken with Dr. Donivan Scull nurse about this and has appointment with Dr. Caryl Comes tomorrow.  Lab Results  Component Value Date   CHOL 160 08/17/2014   HDL 31* 08/17/2014   LDLCALC 97 08/17/2014   LDLDIRECT 150.3 04/10/2009   TRIG 158*  08/17/2014   CHOLHDL 5.2 08/17/2014   Lab Results  Component Value Date   INR 2.0 02/20/2015   INR 1.9 01/19/2015   INR 2.2 12/19/2014    PVD- sees Dr. Ronalee Belts.  CKD s/p nephrectomy due to renal cell CA- followed by renal. Dr.Singh- last seen two months.  Lab Results  Component Value Date   WBC 4.1 08/17/2014   HGB 10.2* 08/17/2014   HCT 32.0* 08/17/2014   MCV 86.3 08/17/2014   PLT 155 08/17/2014   Lab Results  Component Value Date   TSH 1.61 01/14/2014    Review of Systems  Constitutional: Negative.   HENT: Negative.   Eyes: Negative.   Cardiovascular: Positive for chest pain.  Gastrointestinal: Negative.   Endocrine: Negative.   Genitourinary: Positive for dysuria. Negative for frequency, flank pain, enuresis and genital sores.  Musculoskeletal: Negative.   Allergic/Immunologic: Negative.   Neurological: Negative.   Hematological: Negative.       Objective:    BP 134/74 mmHg  Pulse 70  Temp(Src) 97.6 F (36.4 C) (Oral)  Ht 5' 5.5" (1.664 m)  Wt 196 lb 8 oz (89.132 kg)  BMI 32.19 kg/m2  SpO2 98%   Physical Exam  Constitutional: She is oriented to person, place, and time. She appears well-developed and well-nourished. No distress.  HENT:  Head: Normocephalic and atraumatic.  Eyes: Conjunctivae are normal.  Neck: Neck supple.  Cardiovascular: Normal rate and regular rhythm.   Murmur heard. Pulmonary/Chest: Effort normal and breath sounds normal. No respiratory distress.  Abdominal: Soft.  Musculoskeletal:  She exhibits no edema.  Neurological: She is alert and oriented to person, place, and time. No cranial nerve deficit.  Skin: Skin is warm and dry.  Psychiatric: She has a normal mood and affect. Her behavior is normal. Judgment and thought content normal.  Nursing note and vitals reviewed.         Assessment & Plan:   S/p nephrectomy  Renal cell carcinoma, unspecified laterality ([facility])  Elevated serum creatinine  PVD (peripheral  vascular disease) ([place])  Other specified transient cerebral ischemias  Chronic kidney disease (CKD), stage IV (severe) ([facility])  Mitral valve regurgitation  Chronic diastolic CHF (congestive heart failure) ([place])  Essential hypertension  Centrilobular emphysema ([facility])  Carotid artery stenosis, bilateral  Atrial fibrillation, unspecified type ([place])  Hyperlipidemia  Medicare annual wellness visit, subsequent  Dysuria - Plan: Urinalysis Dipstick No Follow-up on file.

## 2015-03-06 NOTE — Assessment & Plan Note (Signed)
No changes made to rx. Rate and rhythm controlled. On coumadin.

## 2015-03-06 NOTE — Progress Notes (Signed)
Patient Care Team: Lucille Passy, MD as PCP - General Minna Merritts, MD as Consulting Physician (Cardiology) Deboraha Sprang, MD as Consulting Physician (Cardiology) Katha Cabal, MD as Consulting Physician (Vascular Surgery)   HPI  [name] is a 81 y.o. female Seen in followup for nonischemic cardiomyopathy for which she has a primary prevention ICD implanted. She has paroxysmal atrial fibrillation; previously this was treated with disopyramide but was switched earlier this year to flecainide. It was however complicated by side effects and she went back on disopyramide. ( the d/c summary 4/16 was WRONG-describing the use of amiodarone)  Echocardiogram 2015 demonstrated normal LV function moderate-severe MR moderate TR.  Echo 4/16 was reviewed and was similar with RV enlargement and PA pressure of 75-mod severe pulm HTN  CAth 2006 without obstructive disease  She has a complicated medical history in addition which includes renal cell carcinoma with prior resection, chronic renal insufficiency and COPD.   Creatinine was 1.4  10/16  Past Medical History  Diagnosis Date  . Carotid artery stenosis 09/2007    60-79% bilateral (stable)  . Carotid artery stenosis 10/2008    40-59% R 60-79%   . Chest pain, unspecified   . Atrial fibrillation ([place])   . Sinus bradycardia   . Hypotension, unspecified     cardiac cath 2006..nonobstructive CAD 30-40s lesions.Marland KitchenETT 1/09 nondiagnostic due to poor HR response..Right Renal Cancer 2003  . Blood in stool   . Secondary cardiomyopathy, unspecified   . Chronic airway obstruction, not elsewhere classified   . Long term (current) use of anticoagulants   . Dysuria   . Edema   . Encounter for therapeutic drug monitoring   . Other malaise and fatigue   . Hypertrophic obstructive cardiomyopathy   . Urge incontinence   . Other acute sinusitis   . Other and unspecified coagulation defects   . Other screening mammogram   .  Pre-operative cardiovascular examination   . Hemorrhage of rectum and anus   . Chronic kidney disease, unspecified ([place])   . Special screening for osteoporosis   . Hypertension 05/20/2011  . PONV (postoperative nausea and vomiting)   . Adjustment disorder with anxiety   . Coronary artery disease     non-obstructive by 2006 cath  . Automatic implantable cardioverter-defibrillator in situ   . High cholesterol   . Anginal pain ([place])   . Chronic bronchitis ([place])     "get it some; not q year" (11/11/2012)  . Exertional shortness of breath   . History of blood transfusion 04/2011    "after hip OR" (11/11/2012)  . Arthritis     "some in my hands" (11/11/2012)  . Osteoarthritis of right hip   . Renal cancer ([place]) 06/2001    Right  . Malignant neoplasm of kidney, except pelvis   . Frequent UTI     "get them a couple times/yr" (11/11/2012)  . Chronic diastolic CHF (congestive heart failure) ([place]) 06/04/2013    Past Surgical History  Procedure Laterality Date  . Abdominal hysterectomy  1975    for benign causes  . Cardiac catheterization  2006  . Nephrectomy Right 06/2001     S/P RENAL CELL CANCER  . Cataract extraction w/ intraocular lens  implant, bilateral  01/2006-02-2006  . Insert / replace / remove pacemaker  05-01-11    02-28-05-/05-04-10-ICD-MEDTRONIC MAXIMAL DR  . Appendectomy    . Total hip arthroplasty Right 05/03/2011    Procedure: TOTAL HIP ARTHROPLASTY  ANTERIOR APPROACH;  Surgeon: Mcarthur Rossetti;  Location: [facility];  Service: Orthopedics;  Laterality: Right;  Removal of Cannulated Screws Right Hip, Right Direct Anterior Hip Replacement  . Laparoscopic cholecystectomy  11/11/2012  . Bi-ventricular pacemaker upgrade  05/04/2010  . Joint replacement    . Cholecystectomy N/A 11/11/2012    Procedure: LAPAROSCOPIC CHOLECYSTECTOMY WITH INTRAOPERATIVE CHOLANGIOGRAM;  Surgeon: Imogene Burn. Georgette Dover, MD;  Location: [facility];  Service: General;  Laterality: N/A;  . Laparoscopic lysis of adhesions  N/A 11/11/2012    Procedure: LAPAROSCOPIC LYSIS OF ADHESIONS;  Surgeon: Imogene Burn. Georgette Dover, MD;  Location: [facility];  Service: General;  Laterality: N/A;    Current Outpatient Prescriptions  Medication Sig Dispense Refill  . acetaminophen (TYLENOL) 500 MG tablet Take 1,000 mg by mouth every 6 (six) hours as needed for headache.    Marland Kitchen atorvastatin (LIPITOR) 80 MG tablet Take 1 tablet (80 mg total) by mouth every evening. 90 tablet 3  . carvedilol (COREG) 12.5 MG tablet TAKE 1 TABLET BY MOUTH TWICE A DAY WITH MEAL 60 tablet 3  . cephALEXin (KEFLEX) 500 MG capsule Take 1 capsule (500 mg total) by mouth 2 (two) times daily. 14 capsule 0  . cetirizine (ZYRTEC) 10 MG tablet Take 10 mg by mouth daily.     . cyanocobalamin (,VITAMIN B-12,) 1000 MCG/ML injection Inject 1,000 mcg into the muscle every 30 (thirty) days.     Marland Kitchen disopyramide (NORPACE) 150 MG capsule TAKE 1 CAPSULE BY MOUTH TWICE DAILY 60 capsule 3  . fluticasone (FLONASE) 50 MCG/ACT nasal spray Place 2 sprays into both nostrils daily. 16 g 2  . furosemide (LASIX) 40 MG tablet Take 1 tablet (40 mg total) by mouth 2 (two) times daily. 60 tablet 3  . potassium chloride (K-DUR,KLOR-CON) 10 MEQ tablet Take 1 tablet (10 mEq total) by mouth 2 (two) times daily with breakfast and lunch. 60 tablet 6  . warfarin (COUMADIN) 1 MG tablet TAKE AS DIRECTED BY COUMADIN CLINIC 30 tablet 0  . warfarin (COUMADIN) 2 MG tablet TAKE 1 TABLET BY MOUTH ONCE A DAY 30 tablet 2  . [DISCONTINUED] hydrochlorothiazide (HYDRODIURIL) 25 MG tablet Take 25 mg by mouth daily after breakfast.      No current facility-administered medications for this visit.    Allergies  Allergen Reactions  . Codeine Nausea And Vomiting  . Morphine And Related Nausea And Vomiting  . Sulfonamide Derivatives Other (See Comments)    REACTION: Dry mouth    Review of Systems negative except from HPI and PMH  Physical Exam There were no vitals taken for this visit. Well developed and well  nourished in no acute distress HENT normal E scleral and icterus clear Neck Supple JVP flat; carotids brisk and full Clear to ausculation { Regular rate and rhythm, no murmurs gallops or rub Soft with active bowel sounds No clubbing cyanosis  Edema Alert and oriented, grossly normal motor and sensory function Skin Warm and Dry  ECG demonstrates P. synchronous pacing  Assessment and  Plan  Nonischemic cardiomyopathy with interval normalization of LV function  Sinus node dysfunction  Atrial fibrillation\  Lower extremity venous thrombosis on warfarin  Pleuritic chest pain   Renal insufficiency GFR  grade 3  10/16  Implantable defibrillator  She has infrequent episodes of atrial fibrillation. Her disopyramide dose is reasonably appropriate given her GFR. If it falls much further, daily dosing might be appropriate.  The episodes of pleuritic pain that are so well localized particularly in the context  of warfarin therapy make pulmonary embolism unlikely.  The fact that she has point tenderness raises the possibility that there is something structural with the ribs. I will start with ordering rib films. I will forward these results to Dr. Marjory Lies and she can further pursue testing as she deems appropriate.  For now we'll continue her on disopyramide. For now we'll continue her on her current doses. Potassium level was normal yesterday.  She is currently AV pacing. Heart rate excursion seems quite appropriate.

## 2015-03-07 ENCOUNTER — Encounter: Payer: Self-pay | Admitting: Internal Medicine

## 2015-03-07 ENCOUNTER — Ambulatory Visit (INDEPENDENT_AMBULATORY_CARE_PROVIDER_SITE_OTHER): Payer: PPO | Admitting: Internal Medicine

## 2015-03-07 VITALS — BP 147/83 | HR 71 | Ht 65.5 in | Wt 197.5 lb

## 2015-03-07 DIAGNOSIS — R0781 Pleurodynia: Secondary | ICD-10-CM

## 2015-03-07 DIAGNOSIS — I5042 Chronic combined systolic (congestive) and diastolic (congestive) heart failure: Secondary | ICD-10-CM | POA: Diagnosis not present

## 2015-03-07 DIAGNOSIS — Z95810 Presence of automatic (implantable) cardiac defibrillator: Secondary | ICD-10-CM

## 2015-03-07 DIAGNOSIS — I495 Sick sinus syndrome: Secondary | ICD-10-CM

## 2015-03-07 LAB — CUP PACEART INCLINIC DEVICE CHECK
Battery Voltage: 2.71 V
Brady Statistic AP VP Percent: 97.93 %
Brady Statistic AP VS Percent: 0.02 %
Brady Statistic AS VP Percent: 1.96 %
Brady Statistic AS VS Percent: 0.09 %
Brady Statistic RA Percent Paced: 97.95 %
Brady Statistic RV Percent Paced: 99.89 %
Date Time Interrogation Session: 20161101115828
HighPow Impedance: 399 Ohm
HighPow Impedance: 42 Ohm
HighPow Impedance: 51 Ohm
Implantable Lead Implant Date: 20061026
Implantable Lead Implant Date: 20111230
Implantable Lead Location: 753859
Implantable Lead Location: 753860
Implantable Lead Model: 5076
Implantable Lead Model: 6947
Lead Channel Impedance Value: 456 Ohm
Lead Channel Impedance Value: 475 Ohm
Lead Channel Pacing Threshold Amplitude: 0.75 V
Lead Channel Pacing Threshold Amplitude: 1.625 V
Lead Channel Pacing Threshold Pulse Width: 0.4 ms
Lead Channel Pacing Threshold Pulse Width: 0.4 ms
Lead Channel Sensing Intrinsic Amplitude: 0.75 mV
Lead Channel Sensing Intrinsic Amplitude: 0.75 mV
Lead Channel Sensing Intrinsic Amplitude: 21.375 mV
Lead Channel Sensing Intrinsic Amplitude: 21.375 mV
Lead Channel Setting Pacing Amplitude: 2.5 V
Lead Channel Setting Pacing Amplitude: 2.5 V
Lead Channel Setting Pacing Pulse Width: 0.4 ms
Lead Channel Setting Sensing Sensitivity: 0.3 mV

## 2015-03-07 NOTE — Patient Instructions (Signed)
Medication Instructions: - no changes  Labwork: - none  Procedures/Testing: - Left rib x-ray.  Follow-Up: - Remote monitoring is used to monitor your Pacemaker of ICD from home. This monitoring reduces the number of office visits required to check your device to one time per year. It allows [place] to keep an eye on the functioning of your device to ensure it is working properly. You are scheduled for a device check from home on 06/06/15. You may send your transmission at any time that day. If you have a wireless device, the transmission will be sent automatically. After your physician reviews your transmission, you will receive a postcard with your next transmission date.  - Your physician wants you to follow-up in: 1 year with Dr. Caryl Comes. You will receive a reminder letter in the mail two months in advance. If you don't receive a letter, please call our office to schedule the follow-up appointment.  Any Additional Special Instructions Will Be Listed Below (If Applicable). - none

## 2015-03-08 ENCOUNTER — Ambulatory Visit
Admission: RE | Admit: 2015-03-08 | Discharge: 2015-03-08 | Disposition: A | Discharge status: Home / Self Care | Payer: PPO | Source: Ambulatory Visit | Attending: Internal Medicine | Admitting: Internal Medicine

## 2015-03-08 ENCOUNTER — Other Ambulatory Visit: Payer: PPO

## 2015-03-08 ENCOUNTER — Ambulatory Visit: Payer: PPO

## 2015-03-08 DIAGNOSIS — R0781 Pleurodynia: Secondary | ICD-10-CM | POA: Diagnosis not present

## 2015-03-08 LAB — URINE CULTURE: Colony Count: 60000

## 2015-03-16 ENCOUNTER — Other Ambulatory Visit: Payer: Self-pay | Admitting: Internal Medicine

## 2015-03-16 ENCOUNTER — Ambulatory Visit
Admission: RE | Admit: 2015-03-16 | Discharge: 2015-03-16 | Disposition: A | Discharge status: Home / Self Care | Payer: PPO | Source: Ambulatory Visit | Attending: Internal Medicine | Admitting: Internal Medicine

## 2015-03-16 DIAGNOSIS — N6002 Solitary cyst of left breast: Secondary | ICD-10-CM | POA: Insufficient documentation

## 2015-03-16 DIAGNOSIS — N644 Mastodynia: Secondary | ICD-10-CM

## 2015-03-16 DIAGNOSIS — N63 Unspecified lump in breast: Secondary | ICD-10-CM | POA: Diagnosis not present

## 2015-03-22 ENCOUNTER — Encounter: Payer: Self-pay | Admitting: Internal Medicine

## 2015-03-23 ENCOUNTER — Other Ambulatory Visit: Payer: Self-pay | Admitting: Cardiovascular Disease

## 2015-03-23 DIAGNOSIS — I6523 Occlusion and stenosis of bilateral carotid arteries: Secondary | ICD-10-CM

## 2015-03-27 ENCOUNTER — Ambulatory Visit: Payer: PPO

## 2015-03-28 ENCOUNTER — Ambulatory Visit (INDEPENDENT_AMBULATORY_CARE_PROVIDER_SITE_OTHER): Payer: PPO | Admitting: *Deleted

## 2015-03-28 DIAGNOSIS — Z5181 Encounter for therapeutic drug level monitoring: Secondary | ICD-10-CM

## 2015-03-28 DIAGNOSIS — Z7902 Long term (current) use of antithrombotics/antiplatelets: Secondary | ICD-10-CM | POA: Diagnosis not present

## 2015-03-28 DIAGNOSIS — I4891 Unspecified atrial fibrillation: Secondary | ICD-10-CM

## 2015-03-28 DIAGNOSIS — E538 Deficiency of other specified B group vitamins: Secondary | ICD-10-CM | POA: Diagnosis not present

## 2015-03-28 DIAGNOSIS — Z7901 Long term (current) use of anticoagulants: Secondary | ICD-10-CM

## 2015-03-28 LAB — POCT INR: INR: 1.7

## 2015-03-28 MED ORDER — CYANOCOBALAMIN 1000 MCG/ML IJ SOLN
1000.0000 ug | Freq: Once | INTRAMUSCULAR | Status: AC
  Administered 2015-03-28: 1000 ug via INTRAMUSCULAR

## 2015-03-28 NOTE — Progress Notes (Signed)
Pre visit review using our clinic review tool, if applicable. No additional management support is needed unless otherwise documented below in the visit note. 

## 2015-04-04 ENCOUNTER — Ambulatory Visit: Payer: PPO

## 2015-04-04 DIAGNOSIS — I6523 Occlusion and stenosis of bilateral carotid arteries: Secondary | ICD-10-CM

## 2015-04-24 ENCOUNTER — Ambulatory Visit: Payer: PPO

## 2015-04-25 ENCOUNTER — Other Ambulatory Visit: Payer: Self-pay | Admitting: Family Medicine

## 2015-04-26 ENCOUNTER — Other Ambulatory Visit: Payer: Self-pay | Admitting: *Deleted

## 2015-04-27 ENCOUNTER — Other Ambulatory Visit: Payer: Self-pay | Admitting: Family Medicine

## 2015-04-27 ENCOUNTER — Ambulatory Visit: Payer: PPO | Admitting: *Deleted

## 2015-04-27 ENCOUNTER — Telehealth: Payer: Self-pay | Admitting: *Deleted

## 2015-04-27 ENCOUNTER — Other Ambulatory Visit (INDEPENDENT_AMBULATORY_CARE_PROVIDER_SITE_OTHER): Payer: PPO

## 2015-04-27 ENCOUNTER — Ambulatory Visit (INDEPENDENT_AMBULATORY_CARE_PROVIDER_SITE_OTHER): Payer: PPO | Admitting: *Deleted

## 2015-04-27 DIAGNOSIS — Z7902 Long term (current) use of antithrombotics/antiplatelets: Secondary | ICD-10-CM | POA: Diagnosis not present

## 2015-04-27 DIAGNOSIS — Z5181 Encounter for therapeutic drug level monitoring: Secondary | ICD-10-CM

## 2015-04-27 DIAGNOSIS — E538 Deficiency of other specified B group vitamins: Secondary | ICD-10-CM

## 2015-04-27 DIAGNOSIS — Z7901 Long term (current) use of anticoagulants: Secondary | ICD-10-CM | POA: Diagnosis not present

## 2015-04-27 DIAGNOSIS — I4891 Unspecified atrial fibrillation: Secondary | ICD-10-CM

## 2015-04-27 LAB — POCT INR: INR: 1.8

## 2015-04-27 LAB — VITAMIN B12: Vitamin B-12: 561 pg/mL (ref 211–911)

## 2015-04-27 LAB — PROTIME-INR
INR: 1.8 ratio — ABNORMAL HIGH (ref 0.8–1.0)
Prothrombin Time: 19.5 s — ABNORMAL HIGH (ref 9.6–13.1)

## 2015-04-27 MED ORDER — CYANOCOBALAMIN 1000 MCG/ML IJ SOLN: 1000.0000 ug | INTRAMUSCULAR | Status: DC

## 2015-04-27 NOTE — Progress Notes (Deleted)
Pre visit review using our clinic review tool, if applicable. No additional management support is needed unless otherwise documented below in the visit note. 

## 2015-04-27 NOTE — Telephone Encounter (Signed)
Error

## 2015-04-27 NOTE — Progress Notes (Signed)
Pre visit review using our clinic review tool, if applicable. No additional management support is needed unless otherwise documented below in the visit note. 

## 2015-05-10 ENCOUNTER — Other Ambulatory Visit: Payer: Self-pay | Admitting: *Deleted

## 2015-05-10 MED ORDER — FUROSEMIDE 40 MG PO TABS: 40.0000 mg | ORAL_TABLET | Freq: Two times a day (BID) | ORAL | Status: DC

## 2015-05-18 ENCOUNTER — Ambulatory Visit: Payer: PPO

## 2015-05-22 ENCOUNTER — Ambulatory Visit (INDEPENDENT_AMBULATORY_CARE_PROVIDER_SITE_OTHER): Payer: PPO | Admitting: Family Medicine

## 2015-05-22 ENCOUNTER — Encounter: Payer: Self-pay | Admitting: Family Medicine

## 2015-05-22 ENCOUNTER — Ambulatory Visit: Payer: PPO

## 2015-05-22 ENCOUNTER — Ambulatory Visit (INDEPENDENT_AMBULATORY_CARE_PROVIDER_SITE_OTHER): Payer: PPO | Admitting: *Deleted

## 2015-05-22 VITALS — BP 136/74 | HR 68 | Temp 97.3°F | Wt 199.5 lb

## 2015-05-22 DIAGNOSIS — Z7902 Long term (current) use of antithrombotics/antiplatelets: Secondary | ICD-10-CM | POA: Diagnosis not present

## 2015-05-22 DIAGNOSIS — R300 Dysuria: Secondary | ICD-10-CM

## 2015-05-22 DIAGNOSIS — E538 Deficiency of other specified B group vitamins: Secondary | ICD-10-CM | POA: Diagnosis not present

## 2015-05-22 LAB — POCT INR: INR: 1.8

## 2015-05-22 LAB — POC URINALSYSI DIPSTICK (AUTOMATED)
Bilirubin, UA: NEGATIVE
Glucose, UA: NEGATIVE
Ketones, UA: NEGATIVE
Nitrite, UA: NEGATIVE
Protein, UA: NEGATIVE
Spec Grav, UA: 1.02
Urobilinogen, UA: 0.2
pH, UA: 6

## 2015-05-22 MED ORDER — CEPHALEXIN 500 MG PO CAPS: 500.0000 mg | ORAL_CAPSULE | Freq: Two times a day (BID) | ORAL | Status: DC

## 2015-05-22 MED ORDER — CYANOCOBALAMIN 1000 MCG/ML IJ SOLN
1000.0000 ug | Freq: Once | INTRAMUSCULAR | Status: AC
  Administered 2015-05-22: 1000 ug via INTRAMUSCULAR

## 2015-05-22 NOTE — Progress Notes (Signed)
Pre visit review using our clinic review tool, if applicable. No additional management support is needed unless otherwise documented below in the visit note. 

## 2015-05-22 NOTE — Addendum Note (Signed)
Addended by: Lucille Passy on: 05/22/2015 03:12 PM   Modules accepted: Miquel Dunn

## 2015-05-22 NOTE — Progress Notes (Signed)
SUBJECTIVE: [name] is a 82 y.o. female who complains of urinary frequency, urgency and dysuria x 16 days, without flank pain, fever, chills, or abnormal vaginal discharge or bleeding.   Current Outpatient Prescriptions on File Prior to Visit  Medication Sig Dispense Refill  . acetaminophen (TYLENOL) 500 MG tablet Take 1,000 mg by mouth every 6 (six) hours as needed for headache.    Marland Kitchen atorvastatin (LIPITOR) 80 MG tablet Take 1 tablet (80 mg total) by mouth every evening. 90 tablet 3  . carvedilol (COREG) 12.5 MG tablet TAKE 1 TABLET BY MOUTH TWICE A DAY WITH MEAL 60 tablet 3  . cetirizine (ZYRTEC) 10 MG tablet Take 10 mg by mouth daily.     . cyanocobalamin (,VITAMIN B-12,) 1000 MCG/ML injection Inject 1 mL (1,000 mcg total) into the muscle every 30 (thirty) days. 1 mL 0  . disopyramide (NORPACE) 150 MG capsule TAKE 1 CAPSULE BY MOUTH TWICE DAILY 60 capsule 3  . fluticasone (FLONASE) 50 MCG/ACT nasal spray Place 2 sprays into both nostrils daily. 16 g 2  . furosemide (LASIX) 40 MG tablet Take 1 tablet (40 mg total) by mouth 2 (two) times daily. 60 tablet 10  . potassium chloride (K-DUR,KLOR-CON) 10 MEQ tablet Take 1 tablet (10 mEq total) by mouth 2 (two) times daily with breakfast and lunch. 60 tablet 6  . warfarin (COUMADIN) 2 MG tablet Take as directed by anti-coagulation clinic 220 tablet 0  . [DISCONTINUED] hydrochlorothiazide (HYDRODIURIL) 25 MG tablet Take 25 mg by mouth daily after breakfast.      No current facility-administered medications on file prior to visit.    Allergies  Allergen Reactions  . Codeine Nausea And Vomiting  . Morphine And Related Nausea And Vomiting  . Sulfonamide Derivatives Other (See Comments)    REACTION: Dry mouth    Past Medical History  Diagnosis Date  . Carotid artery stenosis 09/2007    60-79% bilateral (stable)  . Carotid artery stenosis 10/2008    40-59% R 60-79%   . Chest pain, unspecified   . Atrial fibrillation ([place])   . Sinus  bradycardia   . Hypotension, unspecified     cardiac cath 2006..nonobstructive CAD 30-40s lesions.Marland KitchenETT 1/09 nondiagnostic due to poor HR response..Right Renal Cancer 2003  . Blood in stool   . Secondary cardiomyopathy, unspecified   . Chronic airway obstruction, not elsewhere classified   . Long term (current) use of anticoagulants   . Dysuria   . Edema   . Encounter for therapeutic drug monitoring   . Other malaise and fatigue   . Hypertrophic obstructive cardiomyopathy   . Urge incontinence   . Other acute sinusitis   . Other and unspecified coagulation defects   . Other screening mammogram   . Pre-operative cardiovascular examination   . Hemorrhage of rectum and anus   . Chronic kidney disease, unspecified ([place])   . Special screening for osteoporosis   . Hypertension 05/20/2011  . PONV (postoperative nausea and vomiting)   . Adjustment disorder with anxiety   . Coronary artery disease     non-obstructive by 2006 cath  . Automatic implantable cardioverter-defibrillator in situ   . High cholesterol   . Anginal pain ([place])   . Chronic bronchitis ([place])     "get it some; not q year" (11/11/2012)  . Exertional shortness of breath   . History of blood transfusion 04/2011    "after hip OR" (11/11/2012)  . Arthritis     "some in my hands" (  11/11/2012)  . Osteoarthritis of right hip   . Frequent UTI     "get them a couple times/yr" (11/11/2012)  . Chronic diastolic CHF (congestive heart failure) ([place]) 06/04/2013  . Renal cancer ([place]) 06/2001    Right  . Malignant neoplasm of kidney, except pelvis     Past Surgical History  Procedure Laterality Date  . Abdominal hysterectomy  1975    for benign causes  . Cardiac catheterization  2006  . Nephrectomy Right 06/2001     S/P RENAL CELL CANCER  . Cataract extraction w/ intraocular lens  implant, bilateral  01/2006-02-2006  . Insert / replace / remove pacemaker  05-01-11    02-28-05-/05-04-10-ICD-MEDTRONIC MAXIMAL DR  . Appendectomy    .  Total hip arthroplasty Right 05/03/2011    Procedure: TOTAL HIP ARTHROPLASTY ANTERIOR APPROACH;  Surgeon: Mcarthur Rossetti;  Location: [facility];  Service: Orthopedics;  Laterality: Right;  Removal of Cannulated Screws Right Hip, Right Direct Anterior Hip Replacement  . Laparoscopic cholecystectomy  11/11/2012  . Bi-ventricular pacemaker upgrade  05/04/2010  . Joint replacement    . Cholecystectomy N/A 11/11/2012    Procedure: LAPAROSCOPIC CHOLECYSTECTOMY WITH INTRAOPERATIVE CHOLANGIOGRAM;  Surgeon: Imogene Burn. Georgette Dover, MD;  Location: [facility];  Service: General;  Laterality: N/A;  . Laparoscopic lysis of adhesions N/A 11/11/2012    Procedure: LAPAROSCOPIC LYSIS OF ADHESIONS;  Surgeon: Imogene Burn. Georgette Dover, MD;  Location: [facility];  Service: General;  Laterality: N/A;    Family History  Problem Relation Age of Onset  . Colon cancer Neg Hx   . Heart failure Mother   . Breast cancer Maternal Aunt 82  . Breast cancer Cousin     Social History   Social History  . Marital Status: Widowed    Spouse Name: N/A  . Number of Children: N/A  . Years of Education: N/A   Occupational History  . Retired    Social History Main Topics  . Smoking status: Former Smoker -- 0.50 packs/day for 40 years    Types: Cigarettes    Quit date: 05/06/2001  . Smokeless tobacco: Never Used  . Alcohol Use: No  . Drug Use: No  . Sexual Activity: No   Other Topics Concern  . 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   ALCOHOL USE-NO   RETIRED   FORMER TOBACCO USE.....1/2 PACK X 76 YRS UNTIL 2003   Would desire CPR   The PMH, PSH, Social History, Family History, Medications, and allergies have been reviewed in [facility], and have been updated if relevant.  OBJECTIVE: BP 136/74 mmHg  Pulse 68  Temp(Src) 97.3 F (36.3 C) (Oral)  Wt 199 lb 8 oz (90.493 kg)   SpO2 99%  Appears well, in no apparent distress.  Vital signs are normal. The abdomen is soft without tenderness, guarding, mass, rebound or organomegaly. No CVA tenderness or inguinal adenopathy noted. Urine dipstick shows positive for WBC's and positive for RBC's.   ASSESSMENT: UTI uncomplicated without evidence of pyelonephritis  PLAN: Treatment per orders - keflex 500 mg twice daily x 7 days,  also push fluids, may use Pyridium OTC prn. Call or return to clinic prn if these symptoms worsen or fail to improve as anticipated.

## 2015-05-22 NOTE — Addendum Note (Signed)
Addended by: Modena Nunnery on: 05/22/2015 03:15 PM   Modules accepted: Orders

## 2015-05-22 NOTE — Patient Instructions (Signed)

## 2015-05-24 LAB — URINE CULTURE: Colony Count: 30000

## 2015-06-06 ENCOUNTER — Telehealth: Payer: Self-pay | Admitting: Cardiology

## 2015-06-06 ENCOUNTER — Encounter: Payer: PPO | Admitting: *Deleted

## 2015-06-06 NOTE — Telephone Encounter (Signed)
Spoke with pt and reminded pt of remote transmission that is due today. Pt verbalized understanding.   

## 2015-06-07 ENCOUNTER — Encounter: Payer: Self-pay | Admitting: Cardiology

## 2015-06-07 ENCOUNTER — Other Ambulatory Visit: Payer: Self-pay | Admitting: Cardiovascular Disease

## 2015-06-09 ENCOUNTER — Other Ambulatory Visit: Payer: Self-pay | Admitting: Cardiovascular Disease

## 2015-06-12 ENCOUNTER — Ambulatory Visit (INDEPENDENT_AMBULATORY_CARE_PROVIDER_SITE_OTHER): Payer: PPO | Admitting: *Deleted

## 2015-06-12 DIAGNOSIS — Z5181 Encounter for therapeutic drug level monitoring: Secondary | ICD-10-CM

## 2015-06-12 DIAGNOSIS — Z7901 Long term (current) use of anticoagulants: Secondary | ICD-10-CM | POA: Diagnosis not present

## 2015-06-12 DIAGNOSIS — I4891 Unspecified atrial fibrillation: Secondary | ICD-10-CM | POA: Diagnosis not present

## 2015-06-12 DIAGNOSIS — Z7902 Long term (current) use of antithrombotics/antiplatelets: Secondary | ICD-10-CM

## 2015-06-12 LAB — POCT INR: INR: 1.8

## 2015-06-12 NOTE — Progress Notes (Signed)
Pre visit review using our clinic review tool, if applicable. No additional management support is needed unless otherwise documented below in the visit note. 

## 2015-06-12 NOTE — Progress Notes (Signed)
INR remains subtherapeutic despite increasing dose 3 weeks ago.  Patient states she has gotten mixed up with her days and couldn't remember when to take what.  I will boost her today and continue with 1.5 tablets daily except 1 tablet on Mondays and Fridays.  I encouraged patient to use a pill box to avoid further confusion.  She states she will make herself a chart so that she can remember to take her Coumadin as scheduled.  Will recheck in 3 weeks and make additional adjustments at that time if needed.

## 2015-06-19 ENCOUNTER — Telehealth: Payer: Self-pay

## 2015-06-19 NOTE — Telephone Encounter (Signed)
Pt called, states at the site of her pacemaker, it is sore to the touch, states it was "really bad" Saturday, states when she sleeps, she has to lay a certain way to avoid pain. States this has been going on for about a month. States she has had pain also down through her left breast.

## 2015-06-19 NOTE — Telephone Encounter (Signed)
Returned patient's call.  She states that her skin above her pacemaker is tender to touch and the area to the left of her pacemaker (approaching her underarm) is also sore.  She denies redness, swelling, or fever/chills.  She does not recall pulling any muscles and denies starting any new exercises or activities.  Patient is agreeable to device clinic appointment in [facility] tomorrow, 06/20/15 at 12:00pm.  She is appreciative of call and denies additional questions or concerns at this time.

## 2015-06-20 ENCOUNTER — Ambulatory Visit (INDEPENDENT_AMBULATORY_CARE_PROVIDER_SITE_OTHER): Payer: PPO | Admitting: *Deleted

## 2015-06-20 ENCOUNTER — Other Ambulatory Visit
Admission: RE | Admit: 2015-06-20 | Discharge: 2015-06-20 | Disposition: A | Discharge status: Home / Self Care | Payer: PPO | Source: Ambulatory Visit | Attending: Internal Medicine | Admitting: Internal Medicine

## 2015-06-20 ENCOUNTER — Other Ambulatory Visit: Payer: Self-pay

## 2015-06-20 ENCOUNTER — Encounter: Payer: Self-pay | Admitting: Internal Medicine

## 2015-06-20 DIAGNOSIS — L539 Erythematous condition, unspecified: Secondary | ICD-10-CM

## 2015-06-20 DIAGNOSIS — Z95810 Presence of automatic (implantable) cardiac defibrillator: Secondary | ICD-10-CM | POA: Diagnosis not present

## 2015-06-20 LAB — CUP PACEART INCLINIC DEVICE CHECK
Battery Voltage: 2.64 V
Brady Statistic AP VP Percent: 97.71 %
Brady Statistic AP VS Percent: 0.02 %
Brady Statistic AS VP Percent: 2.09 %
Brady Statistic AS VS Percent: 0.18 %
Brady Statistic RA Percent Paced: 97.73 %
Brady Statistic RV Percent Paced: 99.8 %
Date Time Interrogation Session: 20170214172241
HighPow Impedance: 399 Ohm
HighPow Impedance: 42 Ohm
HighPow Impedance: 54 Ohm
Implantable Lead Implant Date: 20061026
Implantable Lead Implant Date: 20111230
Implantable Lead Location: 753859
Implantable Lead Location: 753860
Implantable Lead Model: 5076
Implantable Lead Model: 6947
Lead Channel Impedance Value: 456 Ohm
Lead Channel Impedance Value: 475 Ohm
Lead Channel Pacing Threshold Amplitude: 0.75 V
Lead Channel Pacing Threshold Amplitude: 1.75 V
Lead Channel Pacing Threshold Pulse Width: 0.4 ms
Lead Channel Pacing Threshold Pulse Width: 0.4 ms
Lead Channel Sensing Intrinsic Amplitude: 0.875 mV
Lead Channel Setting Pacing Amplitude: 2.5 V
Lead Channel Setting Pacing Amplitude: 2.5 V
Lead Channel Setting Pacing Pulse Width: 0.4 ms
Lead Channel Setting Sensing Sensitivity: 0.3 mV

## 2015-06-20 NOTE — Progress Notes (Signed)
ICD check in clinic, added-on for soreness at device site. Patient notes increased tenderness and swelling at site since Saturday, patient denies fever/chills. SK saw patient, recommended blood cultures x2 and ROV with him next week in [place] office.   Normal device function. Thresholds and sensing consistent with previous device measurements. Impedance trends stable over time. No evidence of any ventricular arrhythmias. 209 AT/AF episodes (1.2%), EGMs suggest AF, +warfarin. Histogram distribution appropriate for patient and level of activity. No changes made this session. Device programmed at appropriate safety margins. Device programmed to optimize intrinsic conduction. Battery voltage 2.64V, RRT at 2.63V, patient aware to call if she hears alert tone. Pt enrolled in remote follow-up. Patient education completed including shock plan. ROV with SK/B next week (scheduler made aware that patient needs appointment).  Blood cultures ordered, patient aware to proceed to lab to have them drawn.  She is aware to call in the interim (before appointment next week) with fever/chills, or worsening symptoms.

## 2015-06-23 ENCOUNTER — Telehealth: Payer: Self-pay | Admitting: *Deleted

## 2015-06-23 NOTE — Telephone Encounter (Signed)
Patient called and would like her lab results. Thanks

## 2015-06-25 LAB — CULTURE, BLOOD (SINGLE): Culture: NO GROWTH

## 2015-06-26 NOTE — Telephone Encounter (Signed)
I spoke with the patient. 

## 2015-06-29 ENCOUNTER — Ambulatory Visit (INDEPENDENT_AMBULATORY_CARE_PROVIDER_SITE_OTHER): Payer: PPO | Admitting: Internal Medicine

## 2015-06-29 ENCOUNTER — Encounter: Payer: Self-pay | Admitting: Internal Medicine

## 2015-06-29 VITALS — BP 114/70 | HR 80 | Ht 65.5 in | Wt 197.8 lb

## 2015-06-29 DIAGNOSIS — I495 Sick sinus syndrome: Secondary | ICD-10-CM | POA: Diagnosis not present

## 2015-06-29 DIAGNOSIS — Z95810 Presence of automatic (implantable) cardiac defibrillator: Secondary | ICD-10-CM

## 2015-06-29 DIAGNOSIS — I428 Other cardiomyopathies: Secondary | ICD-10-CM

## 2015-06-29 DIAGNOSIS — I429 Cardiomyopathy, unspecified: Secondary | ICD-10-CM

## 2015-06-29 NOTE — Progress Notes (Signed)
Patient Care Team: Lucille Passy, MD as PCP - General Minna Merritts, MD as Consulting Physician (Cardiology) Deboraha Sprang, MD as Consulting Physician (Cardiology) Katha Cabal, MD as Consulting Physician (Vascular Surgery)   HPI  [name] is a 82 y.o. female Seen in followup for nonischemic cardiomyopathy for which she has a primary prevention ICD implanted initially 2006 with generator replacement about 5 years ago Is significant only says that dofetilide think that he is tolerating  So using a number of eosinophils he wasn't 500 She has paroxysmal atrial fibrillation; previously this was treated with disopyramide but was switched earlier this year to flecainide. It was however complicated by side effects and she went back on disopyramide. ( the d/c summary 4/16 was WRONG-describing the use of amiodarone)  Echocardiogram 2015 demonstrated normal LV function moderate-severe MR moderate TR.  Echo 4/16 was reviewed and was similar with RV enlargement and PA pressure of 75-mod severe pulm HTN  CAth 2006 without obstructive disease  She has a complicated medical history in addition which includes renal cell carcinoma with prior resection, chronic renal insufficiency and COPD.  She is here today because of complaints of tenderness over her defibrillator site. This began a few weeks ago. It is associated with discomfort with use of her arms and a fleeting chest pain like syndrome. Lasting less than a minute, it is unassociated with ambulation.     Creatinine was 1.4  10/16  Past Medical History  Diagnosis Date  . Carotid artery stenosis 09/2007    60-79% bilateral (stable)  . Carotid artery stenosis 10/2008    40-59% R 60-79%   . Chest pain, unspecified   . Atrial fibrillation ([place])   . Sinus bradycardia   . Hypotension, unspecified     cardiac cath 2006..nonobstructive CAD 30-40s lesions.Marland KitchenETT 1/09 nondiagnostic due to poor HR response..Right Renal Cancer 2003    . Blood in stool   . Secondary cardiomyopathy, unspecified   . Chronic airway obstruction, not elsewhere classified   . Long term (current) use of anticoagulants   . Dysuria   . Edema   . Encounter for therapeutic drug monitoring   . Other malaise and fatigue   . Hypertrophic obstructive cardiomyopathy   . Urge incontinence   . Other acute sinusitis   . Other and unspecified coagulation defects   . Other screening mammogram   . Pre-operative cardiovascular examination   . Hemorrhage of rectum and anus   . Chronic kidney disease, unspecified ([place])   . Special screening for osteoporosis   . Hypertension 05/20/2011  . PONV (postoperative nausea and vomiting)   . Adjustment disorder with anxiety   . Coronary artery disease     non-obstructive by 2006 cath  . Automatic implantable cardioverter-defibrillator in situ   . High cholesterol   . Anginal pain ([place])   . Chronic bronchitis ([place])     "get it some; not q year" (11/11/2012)  . Exertional shortness of breath   . History of blood transfusion 04/2011    "after hip OR" (11/11/2012)  . Arthritis     "some in my hands" (11/11/2012)  . Osteoarthritis of right hip   . Frequent UTI     "get them a couple times/yr" (11/11/2012)  . Chronic diastolic CHF (congestive heart failure) ([place]) 06/04/2013  . Renal cancer ([place]) 06/2001    Right  . Malignant neoplasm of kidney, except pelvis     Past Surgical History  Procedure  Laterality Date  . Abdominal hysterectomy  1975    for benign causes  . Cardiac catheterization  2006  . Nephrectomy Right 06/2001     S/P RENAL CELL CANCER  . Cataract extraction w/ intraocular lens  implant, bilateral  01/2006-02-2006  . Insert / replace / remove pacemaker  05-01-11    02-28-05-/05-04-10-ICD-MEDTRONIC MAXIMAL DR  . Appendectomy    . Total hip arthroplasty Right 05/03/2011    Procedure: TOTAL HIP ARTHROPLASTY ANTERIOR APPROACH;  Surgeon: Mcarthur Rossetti;  [facility];  Service: Orthopedics;   Laterality: Right;  Removal of Cannulated Screws Right Hip, Right Direct Anterior Hip Replacement  . Laparoscopic cholecystectomy  11/11/2012  . Bi-ventricular pacemaker upgrade  05/04/2010  . Joint replacement    . Cholecystectomy N/A 11/11/2012    Procedure: LAPAROSCOPIC CHOLECYSTECTOMY WITH INTRAOPERATIVE CHOLANGIOGRAM;  Surgeon: Imogene Burn. Georgette Dover, MD;  Location: [facility];  Service: General;  Laterality: N/A;  . Laparoscopic lysis of adhesions N/A 11/11/2012    Procedure: LAPAROSCOPIC LYSIS OF ADHESIONS;  Surgeon: Imogene Burn. Georgette Dover, MD;  Location: [facility];  Service: General;  Laterality: N/A;    Current Outpatient Prescriptions  Medication Sig Dispense Refill  . acetaminophen (TYLENOL) 500 MG tablet Take 1,000 mg by mouth every 6 (six) hours as needed for headache.    Marland Kitchen atorvastatin (LIPITOR) 80 MG tablet Take 1 tablet (80 mg total) by mouth every evening. 90 tablet 3  . carvedilol (COREG) 12.5 MG tablet TAKE 1 TABLET TWICE A DAY WITH FOOD 60 tablet 3  . cetirizine (ZYRTEC) 10 MG tablet Take 10 mg by mouth daily.     . cyanocobalamin (,VITAMIN B-12,) 1000 MCG/ML injection Inject 1 mL (1,000 mcg total) into the muscle every 30 (thirty) days. 1 mL 0  . disopyramide (NORPACE) 150 MG capsule TAKE 1 CAPSULE BY MOUTH TWICE DAILY 60 capsule 6  . fluticasone (FLONASE) 50 MCG/ACT nasal spray Place 2 sprays into both nostrils daily. 16 g 2  . furosemide (LASIX) 40 MG tablet Take 1 tablet (40 mg total) by mouth 2 (two) times daily. 60 tablet 10  . potassium chloride (K-DUR,KLOR-CON) 10 MEQ tablet Take 1 tablet (10 mEq total) by mouth 2 (two) times daily with breakfast and lunch. 60 tablet 6  . warfarin (COUMADIN) 2 MG tablet Take as directed by anti-coagulation clinic 220 tablet 0  . [DISCONTINUED] hydrochlorothiazide (HYDRODIURIL) 25 MG tablet Take 25 mg by mouth daily after breakfast.      No current facility-administered medications for this visit.    Allergies  Allergen Reactions  . Codeine Nausea And  Vomiting  . Morphine And Related Nausea And Vomiting  . Sulfonamide Derivatives Other (See Comments)    REACTION: Dry mouth    Review of Systems negative except from HPI and PMH  Physical Exam BP 114/70 mmHg  Pulse 80  Ht 5' 5.5" (1.664 m)  Wt 197 lb 12.8 oz (89.721 kg)  BMI 32.40 kg/m2 Well developed and well nourished in no acute distress HENT normal E scleral and icterus clear Neck Supple JVP flat; carotids brisk and full Device pocket well healed; without hematoma   There is no tethering there is tenderness along the superior margin of her device. There is a minimal amount of erythema in the incision line of her device generator replacement Clear to ausculation { Regular rate and rhythm, no murmurs gallops or rub Soft with active bowel sounds No clubbing cyanosis  Edema Alert and oriented, grossly normal motor and sensory function Skin Warm  and Dry  ECG demonstrates  AV pacingAssessment and  Plan  Nonischemic cardiomyopathy with interval normalization of LV function  Sinus node dysfunction  Atrial fibrillation   High risk medication surveillance-disopyramide  Device related chest pain   Renal insufficiency GFR  grade 3  10/16  Implantable defibrillator   There is tenderness over the device pocket and I worry about the possibility of device infection albeit there be no signs of it at this time. Blood cultures were obtained and were negative. We will keep. She will look at it regularly and I will look at it in about 4 weeks.  When she returns, we will anticipate metabolic profile given her disopyramide

## 2015-06-29 NOTE — Patient Instructions (Signed)
Medication Instructions:  Your physician recommends that you continue on your current medications as directed. Please refer to the Current Medication list given to you today.   Labwork: none  Testing/Procedures: none  Follow-Up: Your physician recommends that you schedule a follow-up appointment in: 4 weeks with Dr. Caryl Comes.

## 2015-07-03 ENCOUNTER — Ambulatory Visit (INDEPENDENT_AMBULATORY_CARE_PROVIDER_SITE_OTHER): Payer: PPO | Admitting: *Deleted

## 2015-07-03 DIAGNOSIS — Z5181 Encounter for therapeutic drug level monitoring: Secondary | ICD-10-CM

## 2015-07-03 DIAGNOSIS — I4891 Unspecified atrial fibrillation: Secondary | ICD-10-CM

## 2015-07-03 DIAGNOSIS — E538 Deficiency of other specified B group vitamins: Secondary | ICD-10-CM

## 2015-07-03 DIAGNOSIS — Z7901 Long term (current) use of anticoagulants: Secondary | ICD-10-CM

## 2015-07-03 DIAGNOSIS — Z7902 Long term (current) use of antithrombotics/antiplatelets: Secondary | ICD-10-CM

## 2015-07-03 LAB — POCT INR: INR: 3.5

## 2015-07-03 MED ORDER — WARFARIN SODIUM 2 MG PO TABS: ORAL_TABLET | ORAL | Status: DC

## 2015-07-03 MED ORDER — CYANOCOBALAMIN 1000 MCG/ML IJ SOLN
1000.0000 ug | Freq: Once | INTRAMUSCULAR | Status: AC
  Administered 2015-07-03: 1000 ug via INTRAMUSCULAR

## 2015-07-03 NOTE — Progress Notes (Signed)
Patient is supra therapeutic today.  She denies any extra doses or medication/diet changes.  Will hold Coumadin today, then decrease dose to 18 mg weekly.  Patient encouraged to eat a good serving of greens for the next 2-3 days and to use her pill box.  Will recheck in 4 weeks.  B-12 injection given today.

## 2015-07-03 NOTE — Progress Notes (Signed)
Pre visit review using our clinic review tool, if applicable. No additional management support is needed unless otherwise documented below in the visit note. 

## 2015-07-11 DIAGNOSIS — R600 Localized edema: Secondary | ICD-10-CM | POA: Diagnosis not present

## 2015-07-11 DIAGNOSIS — I129 Hypertensive chronic kidney disease with stage 1 through stage 4 chronic kidney disease, or unspecified chronic kidney disease: Secondary | ICD-10-CM | POA: Diagnosis not present

## 2015-07-11 DIAGNOSIS — I10 Essential (primary) hypertension: Secondary | ICD-10-CM | POA: Diagnosis not present

## 2015-07-11 DIAGNOSIS — N390 Urinary tract infection, site not specified: Secondary | ICD-10-CM | POA: Diagnosis not present

## 2015-07-11 DIAGNOSIS — I272 Other secondary pulmonary hypertension: Secondary | ICD-10-CM | POA: Diagnosis not present

## 2015-07-11 DIAGNOSIS — N183 Chronic kidney disease, stage 3 (moderate): Secondary | ICD-10-CM | POA: Diagnosis not present

## 2015-07-17 ENCOUNTER — Telehealth: Payer: Self-pay

## 2015-07-17 ENCOUNTER — Encounter: Payer: Self-pay | Admitting: Primary Care

## 2015-07-17 ENCOUNTER — Ambulatory Visit (INDEPENDENT_AMBULATORY_CARE_PROVIDER_SITE_OTHER): Payer: PPO | Admitting: Primary Care

## 2015-07-17 VITALS — BP 134/72 | HR 90 | Temp 98.1°F | Ht 65.5 in | Wt 198.8 lb

## 2015-07-17 DIAGNOSIS — J101 Influenza due to other identified influenza virus with other respiratory manifestations: Secondary | ICD-10-CM | POA: Diagnosis not present

## 2015-07-17 LAB — POC INFLUENZA A&B (BINAX/QUICKVUE)
Influenza A, POC: POSITIVE — AB
Influenza B, POC: NEGATIVE

## 2015-07-17 MED ORDER — OSELTAMIVIR PHOSPHATE 75 MG PO CAPS: 75.0000 mg | ORAL_CAPSULE | Freq: Two times a day (BID) | ORAL | Status: DC

## 2015-07-17 MED ORDER — OSELTAMIVIR PHOSPHATE 30 MG PO CAPS: 30.0000 mg | ORAL_CAPSULE | Freq: Two times a day (BID) | ORAL | Status: DC

## 2015-07-17 NOTE — Progress Notes (Signed)
Subjective:    Patient ID: [name], female    DOB: 1933/11/04, 82 y.o.   MRN: [id number]  HPI  [name] is a 82 year old female who presents today with a chief complaint of cough. She also reports fevers (101.3 this morning), nasal congestion, sore throat, chills, body aches. Her symptoms of nasal congestion and cough began 1 week ago. Her body aches, fevers, chills, and fatigue started Saturday with a sudden onset. Her cough is mildly productive and has not been looking at her sputum color.  She's taken extra strength tylenol OTC with temporary improvement. Denies sick contacts recently. Overall she's feeling miserable.  Review of Systems  Constitutional: Positive for fever, chills and fatigue.  HENT: Positive for congestion and sore throat.   Respiratory: Positive for cough. Negative for shortness of breath and wheezing.   Musculoskeletal: Positive for myalgias.  Neurological: Positive for headaches.       Past Medical History  Diagnosis Date  . Carotid artery stenosis 09/2007    60-79% bilateral (stable)  . Carotid artery stenosis 10/2008    40-59% R 60-79%   . Chest pain, unspecified   . Atrial fibrillation ([place])   . Sinus bradycardia   . Hypotension, unspecified     cardiac cath 2006..nonobstructive CAD 30-40s lesions.Marland KitchenETT 1/09 nondiagnostic due to poor HR response..Right Renal Cancer 2003  . Blood in stool   . Secondary cardiomyopathy, unspecified   . Chronic airway obstruction, not elsewhere classified   . Long term (current) use of anticoagulants   . Dysuria   . Edema   . Encounter for therapeutic drug monitoring   . Other malaise and fatigue   . Hypertrophic obstructive cardiomyopathy   . Urge incontinence   . Other acute sinusitis   . Other and unspecified coagulation defects   . Other screening mammogram   . Pre-operative cardiovascular examination   . Hemorrhage of rectum and anus   . Chronic kidney disease, unspecified ([place])   . Special screening  for osteoporosis   . Hypertension 05/20/2011  . PONV (postoperative nausea and vomiting)   . Adjustment disorder with anxiety   . Coronary artery disease     non-obstructive by 2006 cath  . Automatic implantable cardioverter-defibrillator in situ   . High cholesterol   . Anginal pain ([place])   . Chronic bronchitis ([place])     "get it some; not q year" (11/11/2012)  . Exertional shortness of breath   . History of blood transfusion 04/2011    "after hip OR" (11/11/2012)  . Arthritis     "some in my hands" (11/11/2012)  . Osteoarthritis of right hip   . Frequent UTI     "get them a couple times/yr" (11/11/2012)  . Chronic diastolic CHF (congestive heart failure) ([place]) 06/04/2013  . Renal cancer ([place]) 06/2001    Right  . Malignant neoplasm of kidney, except pelvis     Social History   Social History  . Marital Status: Widowed    Spouse Name: N/A  . Number of Children: N/A  . Years of Education: N/A   Occupational History  . Retired    Social History Main Topics  . Smoking status: Former Smoker -- 0.50 packs/day for 40 years    Types: Cigarettes    Quit date: 05/06/2001  . Smokeless tobacco: Never Used  . Alcohol Use: No  . Drug Use: No  . Sexual Activity: No   Other Topics Concern  . 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   ALCOHOL USE-NO   RETIRED   FORMER TOBACCO USE.....1/2 PACK X 70 YRS UNTIL 2003   Would desire CPR    Past Surgical History  Procedure Laterality Date  . Abdominal hysterectomy  1975    for benign causes  . Cardiac catheterization  2006  . Nephrectomy Right 06/2001     S/P RENAL CELL CANCER  . Cataract extraction w/ intraocular lens  implant, bilateral  01/2006-02-2006  . Insert / replace / remove pacemaker  05-01-11    02-28-05-/05-04-10-ICD-MEDTRONIC MAXIMAL DR  . Appendectomy    .  Total hip arthroplasty Right 05/03/2011    Procedure: TOTAL HIP ARTHROPLASTY ANTERIOR APPROACH;  Surgeon: Mcarthur Rossetti;  Location: [facility];  Service: Orthopedics;  Laterality: Right;  Removal of Cannulated Screws Right Hip, Right Direct Anterior Hip Replacement  . Laparoscopic cholecystectomy  11/11/2012  . Bi-ventricular pacemaker upgrade  05/04/2010  . Joint replacement    . Cholecystectomy N/A 11/11/2012    Procedure: LAPAROSCOPIC CHOLECYSTECTOMY WITH INTRAOPERATIVE CHOLANGIOGRAM;  Surgeon: Imogene Burn. Georgette Dover, MD;  Location: [facility];  Service: General;  Laterality: N/A;  . Laparoscopic lysis of adhesions N/A 11/11/2012    Procedure: LAPAROSCOPIC LYSIS OF ADHESIONS;  Surgeon: Imogene Burn. Georgette Dover, MD;  Location: [facility];  Service: General;  Laterality: N/A;    Family History  Problem Relation Age of Onset  . Colon cancer Neg Hx   . Heart failure Mother   . Breast cancer Maternal Aunt 82  . Breast cancer Cousin     Allergies  Allergen Reactions  . Codeine Nausea And Vomiting  . Morphine And Related Nausea And Vomiting  . Sulfonamide Derivatives Other (See Comments)    REACTION: Dry mouth    Current Outpatient Prescriptions on File Prior to Visit  Medication Sig Dispense Refill  . acetaminophen (TYLENOL) 500 MG tablet Take 1,000 mg by mouth every 6 (six) hours as needed for headache.    Marland Kitchen atorvastatin (LIPITOR) 80 MG tablet Take 1 tablet (80 mg total) by mouth every evening. 90 tablet 3  . carvedilol (COREG) 12.5 MG tablet TAKE 1 TABLET TWICE A DAY WITH FOOD 60 tablet 3  . cetirizine (ZYRTEC) 10 MG tablet Take 10 mg by mouth daily.     . cyanocobalamin (,VITAMIN B-12,) 1000 MCG/ML injection Inject 1 mL (1,000 mcg total) into the muscle every 30 (thirty) days. 1 mL 0  . disopyramide (NORPACE) 150 MG capsule TAKE 1 CAPSULE BY MOUTH TWICE DAILY 60 capsule 6  . fluticasone (FLONASE) 50 MCG/ACT nasal spray Place 2 sprays into both nostrils daily. 16 g 2  . furosemide (LASIX) 40 MG tablet Take  1 tablet (40 mg total) by mouth 2 (two) times daily. 60 tablet 10  . potassium chloride (K-DUR,KLOR-CON) 10 MEQ tablet Take 1 tablet (10 mEq total) by mouth 2 (two) times daily with breakfast and lunch. 60 tablet 6  . warfarin (COUMADIN) 2 MG tablet Take as directed by anti-coagulation clinic 120 tablet 0  . [DISCONTINUED] hydrochlorothiazide (HYDRODIURIL) 25 MG tablet Take 25 mg by mouth daily after breakfast.      No current facility-administered medications on file prior to visit.    BP 134/72 mmHg  Pulse 90  Temp(Src) 98.1 F (36.7 C) (Oral)  Ht 5' 5.5" (1.664 m)  Wt 198 lb 12.8 oz (90.175 kg)  BMI 32.57 kg/m2  SpO2 95%    Objective:   Physical Exam  Constitutional: She appears well-nourished. She appears ill.  HENT:  Right Ear: Tympanic membrane and ear canal normal.  Left Ear: Tympanic membrane and ear canal normal.  Nose: Right sinus exhibits maxillary sinus tenderness. Right sinus exhibits no frontal sinus tenderness. Left sinus exhibits maxillary sinus tenderness. Left sinus exhibits no frontal sinus tenderness.  Mouth/Throat: Oropharynx is clear and moist.  Neck: Neck supple.  Cardiovascular: Normal rate and regular rhythm.   Pulmonary/Chest: Effort normal and breath sounds normal.  Mild wheezing to upper lobes bilaterally.  Skin: Skin is warm and dry.          Assessment & Plan:  Influenza A:  Cough, nasal congestion x 1 week. Sudden onset of fevers, chills, body aches this Saturday (48 hours ago). Appears ill, not toxic. Covered up with blanket and coat in office. Lungs with mild wheezing to upper lobes bilaterally.  Rapid Flu: Positive for A. She is inside the window for Tamiflu treatment as it sounds as though her symptoms of influenza began 48 hours ago. RX for renal dosed tamiflu sent to pharmacy for 5 day course. Will continue with supportive treatment. Tylenol PRN. Flonase, fluids, rest. Follow up PRN.

## 2015-07-17 NOTE — Patient Instructions (Addendum)
Your flu test was positive for influenza A.  Since your symptoms become most evident within 48 hours you qualify for treatment.   Start Tamiflu. Take 1 capsule by mouth twice daily for 5 days.  Continue tylenol for fevers and body aches.  Increase consumption of water to stay hydrated.  It was a pleasure meeting you!  Influenza, Adult Influenza ("the flu") is a viral infection of the respiratory tract. It occurs more often in winter months because people spend more time in close contact with one another. Influenza can make you feel very sick. Influenza easily spreads from person to person (contagious). CAUSES  Influenza is caused by a virus that infects the respiratory tract. You can catch the virus by breathing in droplets from an infected person's cough or sneeze. You can also catch the virus by touching something that was recently contaminated with the virus and then touching your mouth, nose, or eyes. RISKS AND COMPLICATIONS You may be at risk for a more severe case of influenza if you smoke cigarettes, have diabetes, have chronic heart disease (such as heart failure) or lung disease (such as asthma), or if you have a weakened immune system. Elderly people and pregnant women are also at risk for more serious infections. The most common problem of influenza is a lung infection (pneumonia). Sometimes, this problem can require emergency medical care and may be life threatening. SIGNS AND SYMPTOMS  Symptoms typically last 4 to 10 days and may include:  Fever.  Chills.  Headache, body aches, and muscle aches.  Sore throat.  Chest discomfort and cough.  Poor appetite.  Weakness or feeling tired.  Dizziness.  Nausea or vomiting. DIAGNOSIS  Diagnosis of influenza is often made based on your history and a physical exam. A nose or throat swab test can be done to confirm the diagnosis. TREATMENT  In mild cases, influenza goes away on its own. Treatment is directed at relieving  symptoms. For more severe cases, your health care provider may prescribe antiviral medicines to shorten the sickness. Antibiotic medicines are not effective because the infection is caused by a virus, not by bacteria. HOME CARE INSTRUCTIONS  Take medicines only as directed by your health care provider.  Use a cool mist humidifier to make breathing easier.  Get plenty of rest until your temperature returns to normal. This usually takes 3 to 4 days.  Drink enough fluid to keep your urine clear or pale yellow.  Cover yourmouth and nosewhen coughing or sneezing,and wash your handswellto prevent thevirusfrom spreading.  Stay homefromwork orschool untilthe fever is gonefor at least 68full day. PREVENTION  An annual influenza vaccination (flu shot) is the best way to avoid getting influenza. An annual flu shot is now routinely recommended for all adults in the [place] IF:  You experiencechest pain, yourcough worsens,or you producemore mucus.  Youhave nausea,vomiting, ordiarrhea.  Your fever returns or gets worse. SEEK IMMEDIATE MEDICAL CARE IF:  You havetrouble breathing, you become short of breath,or your skin ornails becomebluish.  You have severe painor stiffnessin the neck.  You develop a sudden headache, or pain in the face or ear.  You have nausea or vomiting that you cannot control. MAKE SURE YOU:   Understand these instructions.  Will watch your condition.  Will get help right away if you are not doing well or get worse.   This information is not intended to replace advice given to you by your health care provider. Make sure you discuss any questions  you have with your health care provider.   Document Released: 04/19/2000 Document Revised: 05/13/2014 Document Reviewed: 07/22/2011 Elsevier Interactive Patient Education [employer].

## 2015-07-17 NOTE — Telephone Encounter (Signed)
Gerald Stabs with [facility] left v/m requesting cb; received tamiflu 30 mg caps with quantity # 10. Gerald Stabs was informed pt has renal impairment; pt has one kidney. Gerald Stabs said dosing goes by creatinine clearance; if pt is moderately renally impaired between 30 - 60 ml /min for creatine clearance dosage would be 30 mg caps bid for 5 days. Chris request cb.

## 2015-07-17 NOTE — Telephone Encounter (Signed)
I sent in a script for Tamiflu 30 mg capsules earlier today during her office visit. 1 capsule by mouth twice daily, number 10. Her creatine clearance is 46 so she needs this dose.  I'm confused. Is there a problem?

## 2015-07-17 NOTE — Telephone Encounter (Signed)
Chris notified and voice understanding.

## 2015-07-17 NOTE — Progress Notes (Signed)
Pre visit review using our clinic review tool, if applicable. No additional management support is needed unless otherwise documented below in the visit note. 

## 2015-07-21 ENCOUNTER — Telehealth: Payer: Self-pay | Admitting: Internal Medicine

## 2015-07-21 NOTE — Telephone Encounter (Signed)
Forwarding call to device clinic to discuss PPM concern and determine when to follow up

## 2015-07-21 NOTE — Telephone Encounter (Signed)
Patient concerned about waiting until May 30th to see Dr. Caryl Comes for ongoing device pocket tenderness. She had to reschedule because she has had the flu. Per Dr. Caryl Comes- ok to schedule on the 23rd anytime. Patient is agreeable to 9:30 on 07/27/15 in [place].

## 2015-07-21 NOTE — Telephone Encounter (Signed)
Patient had to cancel 3/21 appt as she has the flu.   She has been having issues with her pacemaker and is concerned that waiting until next available in May as now scheduled may be too long.  Patient request that we check with dr. Caryl Comes to get his opinion.  Patient added to waitlist and is willing to only go to [place] office if this is last resort. Please call patient to discuss concerns.

## 2015-07-24 ENCOUNTER — Ambulatory Visit: Payer: PPO | Admitting: Cardiovascular Disease

## 2015-07-25 ENCOUNTER — Encounter: Payer: PPO | Admitting: Internal Medicine

## 2015-07-26 NOTE — Progress Notes (Signed)
Patient Care Team: Lucille Passy, MD as PCP - General Minna Merritts, MD as Consulting Physician (Cardiology) Deboraha Sprang, MD as Consulting Physician (Cardiology) Katha Cabal, MD as Consulting Physician (Vascular Surgery)   HPI  [name] is a 82 y.o. female Seen in followup for nonischemic cardiomyopathy for which she has a primary prevention ICD implanted initially 2006 with generator replacement about 5 years ago.  When seen a month ago, there was tenderness raising the question of infection  She is seen today to look at the wound   Intercurrently she has developed the flu but is better. She has a residual cough.  Her device pocket is less tender.   She has paroxysmal atrial fibrillation; previously this was treated with disopyramide but was switched earlier this year to flecainide. It was however complicated by side effects and she went back on disopyramide. ( the d/c summary 4/16 was WRONG-describing the use of amiodarone)  Echocardiogram 2015 demonstrated normal LV function moderate-severe MR moderate TR.  Echo 4/16 was reviewed and was similar with RV enlargement and PA pressure of 75-mod severe pulm HTN  CAth 2006 without obstructive disease  She has a complicated medical history in addition which includes renal cell carcinoma with prior resection, chronic renal insufficiency and COPD.      Creatinine was 1.4  10/16  Past Medical History  Diagnosis Date  . Carotid artery stenosis 09/2007    60-79% bilateral (stable)  . Carotid artery stenosis 10/2008    40-59% R 60-79%   . Chest pain, unspecified   . Atrial fibrillation ([place])   . Sinus bradycardia   . Hypotension, unspecified     cardiac cath 2006..nonobstructive CAD 30-40s lesions.Marland KitchenETT 1/09 nondiagnostic due to poor HR response..Right Renal Cancer 2003  . Blood in stool   . Secondary cardiomyopathy, unspecified   . Chronic airway obstruction, not elsewhere classified   . Long term  (current) use of anticoagulants   . Dysuria   . Edema   . Encounter for therapeutic drug monitoring   . Other malaise and fatigue   . Hypertrophic obstructive cardiomyopathy   . Urge incontinence   . Other acute sinusitis   . Other and unspecified coagulation defects   . Other screening mammogram   . Pre-operative cardiovascular examination   . Hemorrhage of rectum and anus   . Chronic kidney disease, unspecified ([place])   . Special screening for osteoporosis   . Hypertension 05/20/2011  . PONV (postoperative nausea and vomiting)   . Adjustment disorder with anxiety   . Coronary artery disease     non-obstructive by 2006 cath  . Automatic implantable cardioverter-defibrillator in situ   . High cholesterol   . Anginal pain ([place])   . Chronic bronchitis ([place])     "get it some; not q year" (11/11/2012)  . Exertional shortness of breath   . History of blood transfusion 04/2011    "after hip OR" (11/11/2012)  . Arthritis     "some in my hands" (11/11/2012)  . Osteoarthritis of right hip   . Frequent UTI     "get them a couple times/yr" (11/11/2012)  . Chronic diastolic CHF (congestive heart failure) ([place]) 06/04/2013  . Renal cancer ([place]) 06/2001    Right  . Malignant neoplasm of kidney, except pelvis     Past Surgical History  Procedure Laterality Date  . Abdominal hysterectomy  1975    for benign causes  . Cardiac catheterization  2006  . Nephrectomy Right 06/2001     S/P RENAL CELL CANCER  . Cataract extraction w/ intraocular lens  implant, bilateral  01/2006-02-2006  . Insert / replace / remove pacemaker  05-01-11    02-28-05-/05-04-10-ICD-MEDTRONIC MAXIMAL DR  . Appendectomy    . Total hip arthroplasty Right 05/03/2011    Procedure: TOTAL HIP ARTHROPLASTY ANTERIOR APPROACH;  Surgeon: Mcarthur Rossetti;  Location: [facility];  Service: Orthopedics;  Laterality: Right;  Removal of Cannulated Screws Right Hip, Right Direct Anterior Hip Replacement  . Laparoscopic cholecystectomy   11/11/2012  . Bi-ventricular pacemaker upgrade  05/04/2010  . Joint replacement    . Cholecystectomy N/A 11/11/2012    Procedure: LAPAROSCOPIC CHOLECYSTECTOMY WITH INTRAOPERATIVE CHOLANGIOGRAM;  Surgeon: Imogene Burn. Georgette Dover, MD;  Location: [facility];  Service: General;  Laterality: N/A;  . Laparoscopic lysis of adhesions N/A 11/11/2012    Procedure: LAPAROSCOPIC LYSIS OF ADHESIONS;  Surgeon: Imogene Burn. Georgette Dover, MD;  Location: [facility];  Service: General;  Laterality: N/A;    Current Outpatient Prescriptions  Medication Sig Dispense Refill  . acetaminophen (TYLENOL) 500 MG tablet Take 1,000 mg by mouth every 6 (six) hours as needed for headache.    Marland Kitchen atorvastatin (LIPITOR) 80 MG tablet Take 1 tablet (80 mg total) by mouth every evening. 90 tablet 3  . carvedilol (COREG) 12.5 MG tablet TAKE 1 TABLET TWICE A DAY WITH FOOD 60 tablet 3  . cetirizine (ZYRTEC) 10 MG tablet Take 10 mg by mouth daily.     . cyanocobalamin (,VITAMIN B-12,) 1000 MCG/ML injection Inject 1 mL (1,000 mcg total) into the muscle every 30 (thirty) days. 1 mL 0  . disopyramide (NORPACE) 150 MG capsule TAKE 1 CAPSULE BY MOUTH TWICE DAILY 60 capsule 6  . fluticasone (FLONASE) 50 MCG/ACT nasal spray Place 2 sprays into both nostrils daily. 16 g 2  . furosemide (LASIX) 40 MG tablet Take 1 tablet (40 mg total) by mouth 2 (two) times daily. 60 tablet 10  . oseltamivir (TAMIFLU) 30 MG capsule Take 1 capsule (30 mg total) by mouth 2 (two) times daily. 10 capsule 0  . potassium chloride (K-DUR,KLOR-CON) 10 MEQ tablet Take 1 tablet (10 mEq total) by mouth 2 (two) times daily with breakfast and lunch. 60 tablet 6  . warfarin (COUMADIN) 2 MG tablet Take as directed by anti-coagulation clinic 120 tablet 0  . [DISCONTINUED] hydrochlorothiazide (HYDRODIURIL) 25 MG tablet Take 25 mg by mouth daily after breakfast.      No current facility-administered medications for this visit.    Allergies  Allergen Reactions  . Codeine Nausea And Vomiting  . Morphine  And Related Nausea And Vomiting  . Sulfonamide Derivatives Other (See Comments)    REACTION: Dry mouth    Review of Systems negative except from HPI and PMH  Physical Exam BP 130/72 mmHg  Pulse 78  Ht 5' 7.5" (1.715 m)  Wt 197 lb (89.359 kg)  BMI 30.38 kg/m2 Well developed and well nourished in no acute distress HENT normal E scleral and icterus clear Neck Supple JVP flat; carotids brisk and full Device pocket well healed; without hematoma   There is no tethering there is tenderness along the superior margin of her device. There is a minimal warmth but no erythema in the incision line of her device generator replacement Clear to ausculation { Regular rate and rhythm, no murmurs gallops or rub Soft with active bowel sounds No clubbing cyanosis  Edema Alert and oriented, grossly normal motor and sensory function  Skin Warm and Dry  ECG demonstrates  AV pacing  Assessment and  Plan  Nonischemic cardiomyopathy with interval normalization of LV function  Sinus node dysfunction  Atrial fibrillation   High risk medication surveillance-disopyramide  Device related chest pain   Renal insufficiency GFR  grade 3  10/16  Implantable defibrillator   Her pocket is better. We will look at it again in a couple of months.  We will check a metabolic profile with her renal insufficiency and disopyramide therapy

## 2015-07-27 ENCOUNTER — Encounter: Payer: Self-pay | Admitting: Internal Medicine

## 2015-07-27 ENCOUNTER — Ambulatory Visit (INDEPENDENT_AMBULATORY_CARE_PROVIDER_SITE_OTHER): Payer: PPO | Admitting: Internal Medicine

## 2015-07-27 VITALS — BP 130/72 | HR 78 | Ht 67.5 in | Wt 197.0 lb

## 2015-07-27 DIAGNOSIS — I429 Cardiomyopathy, unspecified: Secondary | ICD-10-CM | POA: Diagnosis not present

## 2015-07-27 DIAGNOSIS — I428 Other cardiomyopathies: Secondary | ICD-10-CM

## 2015-07-27 NOTE — Patient Instructions (Signed)
Medication Instructions: - Your physician recommends that you continue on your current medications as directed. Please refer to the Current Medication list given to you today.  Labwork: - Your physician recommends that you have lab work today: [employer]  Procedures/Testing: - none  Follow-Up: - Your physician recommends that you schedule a follow-up appointment in: 2 months with Dr. Caryl Comes.  Any Additional Special Instructions Will Be Listed Below (If Applicable).     If you need a refill on your cardiac medications before your next appointment, please call your pharmacy.

## 2015-07-28 LAB — BASIC METABOLIC PANEL
BUN/Creatinine Ratio: 11 (ref 11–26)
BUN: 14 mg/dL (ref 8–27)
CO2: 24 mmol/L (ref 18–29)
Calcium: 9 mg/dL (ref 8.7–10.3)
Chloride: 100 mmol/L (ref 96–106)
Creatinine, Ser: 1.3 mg/dL — ABNORMAL HIGH (ref 0.57–1.00)
GFR calc Af Amer: 45 mL/min/{1.73_m2} — ABNORMAL LOW (ref >59)
GFR calc non Af Amer: 39 mL/min/{1.73_m2} — ABNORMAL LOW (ref >59)
Glucose: 72 mg/dL (ref 65–99)
Potassium: 4.9 mmol/L (ref 3.5–5.2)
Sodium: 143 mmol/L (ref 134–144)

## 2015-07-31 ENCOUNTER — Ambulatory Visit (INDEPENDENT_AMBULATORY_CARE_PROVIDER_SITE_OTHER): Payer: PPO | Admitting: *Deleted

## 2015-07-31 DIAGNOSIS — I4891 Unspecified atrial fibrillation: Secondary | ICD-10-CM | POA: Diagnosis not present

## 2015-07-31 DIAGNOSIS — Z5181 Encounter for therapeutic drug level monitoring: Secondary | ICD-10-CM

## 2015-07-31 DIAGNOSIS — Z7901 Long term (current) use of anticoagulants: Secondary | ICD-10-CM | POA: Diagnosis not present

## 2015-07-31 DIAGNOSIS — Z7902 Long term (current) use of antithrombotics/antiplatelets: Secondary | ICD-10-CM | POA: Diagnosis not present

## 2015-07-31 LAB — POCT INR: INR: 2.9

## 2015-07-31 NOTE — Progress Notes (Signed)
Pre visit review using our clinic review tool, if applicable. No additional management support is needed unless otherwise documented below in the visit note. 

## 2015-08-09 ENCOUNTER — Ambulatory Visit (INDEPENDENT_AMBULATORY_CARE_PROVIDER_SITE_OTHER): Payer: PPO | Admitting: Cardiovascular Disease

## 2015-08-09 ENCOUNTER — Encounter: Payer: Self-pay | Admitting: Cardiovascular Disease

## 2015-08-09 VITALS — BP 138/60 | HR 71 | Ht 67.0 in | Wt 194.5 lb

## 2015-08-09 DIAGNOSIS — I340 Nonrheumatic mitral (valve) insufficiency: Secondary | ICD-10-CM | POA: Diagnosis not present

## 2015-08-09 DIAGNOSIS — I4891 Unspecified atrial fibrillation: Secondary | ICD-10-CM

## 2015-08-09 DIAGNOSIS — E785 Hyperlipidemia, unspecified: Secondary | ICD-10-CM

## 2015-08-09 DIAGNOSIS — I10 Essential (primary) hypertension: Secondary | ICD-10-CM

## 2015-08-09 DIAGNOSIS — I739 Peripheral vascular disease, unspecified: Secondary | ICD-10-CM | POA: Diagnosis not present

## 2015-08-09 DIAGNOSIS — I5032 Chronic diastolic (congestive) heart failure: Secondary | ICD-10-CM | POA: Diagnosis not present

## 2015-08-09 MED ORDER — ROSUVASTATIN CALCIUM 40 MG PO TABS: 40.0000 mg | ORAL_TABLET | Freq: Every day | ORAL | Status: DC

## 2015-08-09 NOTE — Patient Instructions (Signed)
You are doing well.  Please switch to crestor 40 mg daily  Please call us if you have new issues that need to be addressed before your next appt.  Your physician wants you to follow-up in: 6 months.  You will receive a reminder letter in the mail two months in advance. If you don't receive a letter, please call our office to schedule the follow-up appointment.

## 2015-08-09 NOTE — Progress Notes (Signed)
Patient ID: [name], female    DOB: Jun 08, 1933, 82 y.o.   MRN: [id number]  HPI Comments: [name] is a 82 year old woman with a history of syncope, COPD, as well as symptomatic bradycardia.  She is status post ICD.  history of nonobstructive coronary artery disease with chronic chest pain, bilateral carotid artery stenosis 60-70% disease on the left, paroxysmal atrial fibrillation, renal cell carcinoma status post resection of her right kidney with residual chronic renal insufficiency, and COPD, stopped smoking over 10 years ago.   She has chronic leg pain She presents for routine follow-up of her carotid arterial disease, atrial fibrillation. She has a single kidney, creatinine 1.4  In follow-up today, she reports that she is well, sedentary at baseline, no new complaints Reports she has been having difficulty swallowing some of her pills including Lipitor, has not been compliant Most recent lab work reviewed with her showing total cholesterol of 200  Carotid ultrasound reviewed with her showing stable moderate to severe bilateral carotid disease She has chronic leg pain. No regular exercise. complains of shortness of breath with exertion. She was told intervention on her legs was not possible given her single kidney and renal dysfunction seen by Dr. Ronalee Belts  EKG on today's visit shows AV paced rhythm 71 bpm, APCs noted  Other past medical history  She did try flecainide in the past and she had significant side effects as well as breakthrough arrhythmia.  right hip replacement on May 03 2011.    Chronic dizziness since the summer of 2014 . Symptoms were worse in December 2014  Echocardiogram showed normal ejection fraction, moderate mitral valve regurgitation, moderate TR, moderately elevated right ventricular systolic pressures.   stress test in March 2011.    Last carotid u/s 40-59% R 60-79%. No neuro symptoms.     Cardiac cath 2006    --non-obstructive CAD  30-40s lesions She reports having repeat catheterization since that time with no significant progression of her disease   Allergies  Allergen Reactions  . Codeine Nausea And Vomiting  . Morphine And Related Nausea And Vomiting  . Sulfonamide Derivatives Other (See Comments)    REACTION: Dry mouth    Outpatient Encounter Prescriptions as of 08/09/2015  Medication Sig  . acetaminophen (TYLENOL) 500 MG tablet Take 1,000 mg by mouth every 6 (six) hours as needed for headache.  . carvedilol (COREG) 12.5 MG tablet TAKE 1 TABLET TWICE A DAY WITH FOOD  . cetirizine (ZYRTEC) 10 MG tablet Take 10 mg by mouth daily.   . cyanocobalamin (,VITAMIN B-12,) 1000 MCG/ML injection Inject 1 mL (1,000 mcg total) into the muscle every 30 (thirty) days.  Marland Kitchen disopyramide (NORPACE) 150 MG capsule TAKE 1 CAPSULE BY MOUTH TWICE DAILY  . fluticasone (FLONASE) 50 MCG/ACT nasal spray Place 2 sprays into both nostrils daily.  . furosemide (LASIX) 40 MG tablet Take 1 tablet (40 mg total) by mouth 2 (two) times daily.  Marland Kitchen oseltamivir (TAMIFLU) 30 MG capsule Take 1 capsule (30 mg total) by mouth 2 (two) times daily.  . potassium chloride (K-DUR,KLOR-CON) 10 MEQ tablet Take 1 tablet (10 mEq total) by mouth 2 (two) times daily with breakfast and lunch.  . warfarin (COUMADIN) 2 MG tablet Take as directed by anti-coagulation clinic  . [DISCONTINUED] atorvastatin (LIPITOR) 80 MG tablet Take 1 tablet (80 mg total) by mouth every evening.  . rosuvastatin (CRESTOR) 40 MG tablet Take 1 tablet (40 mg total) by mouth daily.   No facility-administered encounter medications on  file as of 08/09/2015.    Past Medical History  Diagnosis Date  . Carotid artery stenosis 09/2007    60-79% bilateral (stable)  . Carotid artery stenosis 10/2008    40-59% R 60-79%   . Chest pain, unspecified   . Atrial fibrillation ([place])   . Sinus bradycardia   . Hypotension, unspecified     cardiac cath 2006..nonobstructive CAD 30-40s lesions.Marland KitchenETT 1/09  nondiagnostic due to poor HR response..Right Renal Cancer 2003  . Blood in stool   . Secondary cardiomyopathy, unspecified   . Chronic airway obstruction, not elsewhere classified   . Long term (current) use of anticoagulants   . Dysuria   . Edema   . Encounter for therapeutic drug monitoring   . Other malaise and fatigue   . Hypertrophic obstructive cardiomyopathy   . Urge incontinence   . Other acute sinusitis   . Other and unspecified coagulation defects   . Other screening mammogram   . Pre-operative cardiovascular examination   . Hemorrhage of rectum and anus   . Chronic kidney disease, unspecified ([place])   . Special screening for osteoporosis   . Hypertension 05/20/2011  . PONV (postoperative nausea and vomiting)   . Adjustment disorder with anxiety   . Coronary artery disease     non-obstructive by 2006 cath  . Automatic implantable cardioverter-defibrillator in situ   . High cholesterol   . Anginal pain ([place])   . Chronic bronchitis ([place])     "get it some; not q year" (11/11/2012)  . Exertional shortness of breath   . History of blood transfusion 04/2011    "after hip OR" (11/11/2012)  . Arthritis     "some in my hands" (11/11/2012)  . Osteoarthritis of right hip   . Frequent UTI     "get them a couple times/yr" (11/11/2012)  . Chronic diastolic CHF (congestive heart failure) ([place]) 06/04/2013  . Renal cancer ([place]) 06/2001    Right  . Malignant neoplasm of kidney, except pelvis     Past Surgical History  Procedure Laterality Date  . Abdominal hysterectomy  1975    for benign causes  . Cardiac catheterization  2006  . Nephrectomy Right 06/2001     S/P RENAL CELL CANCER  . Cataract extraction w/ intraocular lens  implant, bilateral  01/2006-02-2006  . Insert / replace / remove pacemaker  05-01-11    02-28-05-/05-04-10-ICD-MEDTRONIC MAXIMAL DR  . Appendectomy    . Total hip arthroplasty Right 05/03/2011    Procedure: TOTAL HIP ARTHROPLASTY ANTERIOR APPROACH;  Surgeon:  Mcarthur Rossetti;  Location: [facility];  Service: Orthopedics;  Laterality: Right;  Removal of Cannulated Screws Right Hip, Right Direct Anterior Hip Replacement  . Laparoscopic cholecystectomy  11/11/2012  . Bi-ventricular pacemaker upgrade  05/04/2010  . Joint replacement    . Cholecystectomy N/A 11/11/2012    Procedure: LAPAROSCOPIC CHOLECYSTECTOMY WITH INTRAOPERATIVE CHOLANGIOGRAM;  Surgeon: Imogene Burn. Georgette Dover, MD;  Location: [facility];  Service: General;  Laterality: N/A;  . Laparoscopic lysis of adhesions N/A 11/11/2012    Procedure: LAPAROSCOPIC LYSIS OF ADHESIONS;  Surgeon: Imogene Burn. Georgette Dover, MD;  Location: [facility];  Service: General;  Laterality: N/A;    Social History  reports that she quit smoking about 14 years ago. Her smoking use included Cigarettes. She has a 20 pack-year smoking history. She has never used smokeless tobacco. She reports that she does not drink alcohol or use illicit drugs.  Family History family history includes Breast cancer in her cousin; Breast cancer (  age of onset: 28) in her maternal aunt; Heart failure in her mother. There is no history of Colon cancer.       Review of Systems  Constitutional: Negative.   Respiratory: Positive for shortness of breath.   Cardiovascular: Negative.   Gastrointestinal: Negative.   Musculoskeletal:       Leg pain  Skin: Negative.   Neurological: Negative.   Hematological: Negative.   Psychiatric/Behavioral: Negative.   All other systems reviewed and are negative.   BP 138/60 mmHg  Pulse 71  Ht 5\' 7"  (1.702 m)  Wt 194 lb 8 oz (88.225 kg)  BMI 30.46 kg/m2  Physical Exam  Constitutional: She is oriented to person, place, and time. She appears well-developed and well-nourished.  HENT:  Head: Normocephalic.  Nose: Nose normal.  Mouth/Throat: Oropharynx is clear and moist.  Eyes: Conjunctivae are normal. Pupils are equal, round, and reactive to light.  Neck: Normal range of motion. Neck supple. No JVD present.   Cardiovascular: Normal rate, regular rhythm, S1 normal, S2 normal and intact distal pulses.  Exam reveals no gallop and no friction rub.   Murmur heard.  Systolic murmur is present with a grade of 2/6  Pulmonary/Chest: Effort normal and breath sounds normal. No respiratory distress. She has no wheezes. She has no rales. She exhibits no tenderness.  Abdominal: Soft. Bowel sounds are normal. She exhibits no distension. There is no tenderness.  Musculoskeletal: Normal range of motion. She exhibits no edema or tenderness.  Lymphadenopathy:    She has no cervical adenopathy.  Neurological: She is alert and oriented to person, place, and time. Coordination normal.  Skin: Skin is warm and dry. No rash noted. No erythema.  Psychiatric: She has a normal mood and affect. Her behavior is normal. Judgment and thought content normal.    Assessment and Plan  Nursing note and vitals reviewed.             Patient ID: LASAUNDRA BLUST, female    DOB: [birth year], 82 y.o.   MRN: [id number]  HPI Comments: Ms. Gatchell is a 82 year old woman with a history of syncope, COPD, as well as symptomatic bradycardia.  She is status post ICD.  history of nonobstructive coronary artery disease with chronic chest pain, bilateral carotid artery stenosis 60-70% disease on the left, paroxysmal atrial fibrillation, renal cell carcinoma status post resection of her right kidney with residual chronic renal insufficiency, and COPD.   She has chronic leg pain She presents for routine follow-up of her carotid arterial disease, atrial fibrillation.  Overall she reports that she is doing well. Recently seen by Dr. Ronalee Belts, told that she had blockages in her legs. Unable to do angiogram secondary to renal dysfunction. She reports very little atrial fibrillation when she takes Norpace 150 mg twice a day. Recently she has been having trouble getting this medication from her new insurance company. She did try flecainide in the  past and she had significant side effects as well as breakthrough arrhythmia. She is tolerating her cholesterol medication. No recent lab work in 3 years. She is overdue for carotid ultrasound She does not do any regular exercise, she is concerned about her weight.  Prior ABI/lower extremity Doppler may 2013 did not show significant PAD. She has tried to hold her Lipitor with no improvement of her leg pain. No recent episodes of near syncope or syncope. Takes her Lasix but not consistently.  EKG on today's visit shows AV paced rhythm, rate 66 bpm   Other  past medical history  right hip replacement on May 03 2011.    Chronic dizziness since the summer of 2014 . Symptoms were worse in December 2014  Echocardiogram showed normal ejection fraction, moderate mitral valve regurgitation, moderate TR, moderately elevated right ventricular systolic pressures.   stress test in March 2011.    Last carotid u/s 40-59% R 60-79%. No neuro symptoms.     Cardiac cath 2006    --non-obstructive CAD 30-40s lesions She reports having repeat catheterization since that time with no significant progression of her disease   Allergies  Allergen Reactions  . Codeine Nausea And Vomiting  . Morphine And Related Nausea And Vomiting  . Sulfonamide Derivatives Other (See Comments)    REACTION: Dry mouth    Outpatient Encounter Prescriptions as of 08/09/2015  Medication Sig  . acetaminophen (TYLENOL) 500 MG tablet Take 1,000 mg by mouth every 6 (six) hours as needed for headache.  . carvedilol (COREG) 12.5 MG tablet TAKE 1 TABLET TWICE A DAY WITH FOOD  . cetirizine (ZYRTEC) 10 MG tablet Take 10 mg by mouth daily.   . cyanocobalamin (,VITAMIN B-12,) 1000 MCG/ML injection Inject 1 mL (1,000 mcg total) into the muscle every 30 (thirty) days.  Marland Kitchen disopyramide (NORPACE) 150 MG capsule TAKE 1 CAPSULE BY MOUTH TWICE DAILY  . fluticasone (FLONASE) 50 MCG/ACT nasal spray Place 2 sprays into both nostrils daily.  .  furosemide (LASIX) 40 MG tablet Take 1 tablet (40 mg total) by mouth 2 (two) times daily.  Marland Kitchen oseltamivir (TAMIFLU) 30 MG capsule Take 1 capsule (30 mg total) by mouth 2 (two) times daily.  . potassium chloride (K-DUR,KLOR-CON) 10 MEQ tablet Take 1 tablet (10 mEq total) by mouth 2 (two) times daily with breakfast and lunch.  . warfarin (COUMADIN) 2 MG tablet Take as directed by anti-coagulation clinic  . [DISCONTINUED] atorvastatin (LIPITOR) 80 MG tablet Take 1 tablet (80 mg total) by mouth every evening.  . rosuvastatin (CRESTOR) 40 MG tablet Take 1 tablet (40 mg total) by mouth daily.   No facility-administered encounter medications on file as of 08/09/2015.    Past Medical History  Diagnosis Date  . Carotid artery stenosis 09/2007    60-79% bilateral (stable)  . Carotid artery stenosis 10/2008    40-59% R 60-79%   . Chest pain, unspecified   . Atrial fibrillation ([place])   . Sinus bradycardia   . Hypotension, unspecified     cardiac cath 2006..nonobstructive CAD 30-40s lesions.Marland KitchenETT 1/09 nondiagnostic due to poor HR response..Right Renal Cancer 2003  . Blood in stool   . Secondary cardiomyopathy, unspecified   . Chronic airway obstruction, not elsewhere classified   . Long term (current) use of anticoagulants   . Dysuria   . Edema   . Encounter for therapeutic drug monitoring   . Other malaise and fatigue   . Hypertrophic obstructive cardiomyopathy   . Urge incontinence   . Other acute sinusitis   . Other and unspecified coagulation defects   . Other screening mammogram   . Pre-operative cardiovascular examination   . Hemorrhage of rectum and anus   . Chronic kidney disease, unspecified ([place])   . Special screening for osteoporosis   . Hypertension 05/20/2011  . PONV (postoperative nausea and vomiting)   . Adjustment disorder with anxiety   . Coronary artery disease     non-obstructive by 2006 cath  . Automatic implantable cardioverter-defibrillator in situ   . High cholesterol    . Anginal pain ([place])   .  Chronic bronchitis ([place])     "get it some; not q year" (11/11/2012)  . Exertional shortness of breath   . History of blood transfusion 04/2011    "after hip OR" (11/11/2012)  . Arthritis     "some in my hands" (11/11/2012)  . Osteoarthritis of right hip   . Frequent UTI     "get them a couple times/yr" (11/11/2012)  . Chronic diastolic CHF (congestive heart failure) ([place]) 06/04/2013  . Renal cancer ([place]) 06/2001    Right  . Malignant neoplasm of kidney, except pelvis     Past Surgical History  Procedure Laterality Date  . Abdominal hysterectomy  1975    for benign causes  . Cardiac catheterization  2006  . Nephrectomy Right 06/2001     S/P RENAL CELL CANCER  . Cataract extraction w/ intraocular lens  implant, bilateral  01/2006-02-2006  . Insert / replace / remove pacemaker  05-01-11    02-28-05-/05-04-10-ICD-MEDTRONIC MAXIMAL DR  . Appendectomy    . Total hip arthroplasty Right 05/03/2011    Procedure: TOTAL HIP ARTHROPLASTY ANTERIOR APPROACH;  Surgeon: Mcarthur Rossetti;  Location: [facility];  Service: Orthopedics;  Laterality: Right;  Removal of Cannulated Screws Right Hip, Right Direct Anterior Hip Replacement  . Laparoscopic cholecystectomy  11/11/2012  . Bi-ventricular pacemaker upgrade  05/04/2010  . Joint replacement    . Cholecystectomy N/A 11/11/2012    Procedure: LAPAROSCOPIC CHOLECYSTECTOMY WITH INTRAOPERATIVE CHOLANGIOGRAM;  Surgeon: Imogene Burn. Georgette Dover, MD;  Location: [facility];  Service: General;  Laterality: N/A;  . Laparoscopic lysis of adhesions N/A 11/11/2012    Procedure: LAPAROSCOPIC LYSIS OF ADHESIONS;  Surgeon: Imogene Burn. Georgette Dover, MD;  Location: [facility];  Service: General;  Laterality: N/A;    Social History  reports that she quit smoking about 14 years ago. Her smoking use included Cigarettes. She has a 20 pack-year smoking history. She has never used smokeless tobacco. She reports that she does not drink alcohol or use illicit drugs.  Family  History family history includes Breast cancer in her cousin; Breast cancer (age of onset: 66) in her maternal aunt; Heart failure in her mother. There is no history of Colon cancer.       Review of Systems  Constitutional: Negative.   Respiratory: Positive for shortness of breath.   Cardiovascular: Negative.   Gastrointestinal: Negative.   Musculoskeletal:       Leg pain  Skin: Negative.   Neurological: Negative.   Hematological: Negative.   Psychiatric/Behavioral: Negative.   All other systems reviewed and are negative.   BP 138/60 mmHg  Pulse 71  Ht 5\' 7"  (1.702 m)  Wt 194 lb 8 oz (88.225 kg)  BMI 30.46 kg/m2  Physical Exam  Constitutional: She is oriented to person, place, and time. She appears well-developed and well-nourished.  HENT:  Head: Normocephalic.  Nose: Nose normal.  Mouth/Throat: Oropharynx is clear and moist.  Eyes: Conjunctivae are normal. Pupils are equal, round, and reactive to light.  Neck: Normal range of motion. Neck supple. No JVD present.  Cardiovascular: Normal rate, regular rhythm, S1 normal, S2 normal and intact distal pulses.  Exam reveals no gallop and no friction rub.   Murmur heard.  Systolic murmur is present with a grade of 2/6  Pulmonary/Chest: Effort normal and breath sounds normal. No respiratory distress. She has no wheezes. She has no rales. She exhibits no tenderness.  Abdominal: Soft. Bowel sounds are normal. She exhibits no distension. There is no tenderness.  Musculoskeletal: Normal range  of motion. She exhibits no edema or tenderness.  Lymphadenopathy:    She has no cervical adenopathy.  Neurological: She is alert and oriented to person, place, and time. Coordination normal.  Skin: Skin is warm and dry. No rash noted. No erythema.  Psychiatric: She has a normal mood and affect. Her behavior is normal. Judgment and thought content normal.    Assessment and Plan  Nursing note and vitals reviewed.

## 2015-08-09 NOTE — Assessment & Plan Note (Signed)
Recommended she start Crestor 40 mg daily, stop the Lipitor She was not taking Lipitor on a consistent basis, having difficulty swallowing the pill

## 2015-08-09 NOTE — Assessment & Plan Note (Signed)
Appears relatively euvolemic, recommended that she stay on her Lasix Lab work reviewed with her showing creatinine 1.3, stable

## 2015-08-09 NOTE — Assessment & Plan Note (Signed)
We reviewed her carotid ultrasound with her, stressed the importance of compliance with her statin She has difficulty swallowing large dose Lipitor, we will change to Crestor 40 mg daily

## 2015-08-09 NOTE — Assessment & Plan Note (Signed)
Blood pressure is well controlled on today's visit. No changes made to the medications. 

## 2015-08-17 ENCOUNTER — Telehealth: Payer: Self-pay | Admitting: Family Medicine

## 2015-08-17 NOTE — Telephone Encounter (Signed)
Patient Name: [name]  DOB: 11/26/1933    Initial Comment Caller states c/o chills, nausea, blood in urine   Nurse Assessment  Nurse: Christel Mormon, RN, Levada Dy Date/Time (Eastern Time): 08/17/2015 4:19:03 PM  Confirm and document reason for call. If symptomatic, describe symptoms. You must click the next button to save text entered. ---Caller states last night she got a chill and saw blood in her urine. She also has a little discomfort when she urinates and "pressure" pain. She states she only has 1 kidney- the other was removed for cancer. The kidney doctor said the remaining kidney is not functioning as well as a normal kidney.  Has the patient traveled out of the country within the last 30 days? ---No  Does the patient have any new or worsening symptoms? ---Yes  Will a triage be completed? ---Yes  Related visit to physician within the last 2 weeks? ---No  Does the PT have any chronic conditions? (i.e. diabetes, asthma, etc.) ---Yes  List chronic conditions. ---Carotid artery stenosis, A.fib, bradycardia, cardiomyopathy, pacemaker/defibrillator, chronic kidney disease, CHF, osteoarthritis, No longer takes Flonase  Is this a behavioral health or substance abuse call? ---No     Guidelines    Guideline Title Affirmed Question Affirmed Notes  Urine - Blood In Taking Coumadin (warfarin) or other strong blood thinner, or known bleeding disorder (e.g., thrombocytopenia)    Final Disposition User   See Physician within 4 Hours (or PCP triage) [place], RN, Levada Dy    Comments  no answer now on primary number and secondary number does not have voicemail  Spoke with office nurse- nothing can back called and instructed to have patient go to Urgent care. Pt states she is not going to urgent care as due to her history, they wont treat her and send her to the ER. She states she will wait until Monday unless she gets worse.   Referrals  GO TO FACILITY REFUSED   Disagree/Comply: Comply

## 2015-08-17 NOTE — Telephone Encounter (Signed)
Unable to reach pt by phone; [name] at front desk said pt called and scheduled appt for 08/21/15 with Dr Deborra Medina and if symptoms get worse prior to appt pt will go to [facility].

## 2015-08-18 DIAGNOSIS — R0982 Postnasal drip: Secondary | ICD-10-CM | POA: Diagnosis not present

## 2015-08-18 DIAGNOSIS — J302 Other seasonal allergic rhinitis: Secondary | ICD-10-CM | POA: Diagnosis not present

## 2015-08-18 DIAGNOSIS — N3001 Acute cystitis with hematuria: Secondary | ICD-10-CM | POA: Diagnosis not present

## 2015-08-18 DIAGNOSIS — R300 Dysuria: Secondary | ICD-10-CM | POA: Diagnosis not present

## 2015-08-21 ENCOUNTER — Telehealth: Payer: Self-pay

## 2015-08-21 ENCOUNTER — Ambulatory Visit (INDEPENDENT_AMBULATORY_CARE_PROVIDER_SITE_OTHER): Payer: PPO | Admitting: Family Medicine

## 2015-08-21 ENCOUNTER — Encounter: Payer: Self-pay | Admitting: Family Medicine

## 2015-08-21 VITALS — BP 122/76 | HR 62 | Temp 97.8°F | Ht 67.0 in | Wt 193.5 lb

## 2015-08-21 DIAGNOSIS — R05 Cough: Secondary | ICD-10-CM

## 2015-08-21 DIAGNOSIS — N390 Urinary tract infection, site not specified: Secondary | ICD-10-CM | POA: Insufficient documentation

## 2015-08-21 DIAGNOSIS — R300 Dysuria: Secondary | ICD-10-CM

## 2015-08-21 DIAGNOSIS — R059 Cough, unspecified: Secondary | ICD-10-CM | POA: Insufficient documentation

## 2015-08-21 DIAGNOSIS — R051 Acute cough: Secondary | ICD-10-CM | POA: Insufficient documentation

## 2015-08-21 LAB — POC URINALSYSI DIPSTICK (AUTOMATED)
Bilirubin, UA: NEGATIVE
Blood, UA: POSITIVE
Glucose, UA: NEGATIVE
Ketones, UA: NEGATIVE
Leukocytes, UA: NEGATIVE
Nitrite, UA: NEGATIVE
Protein, UA: NEGATIVE
Spec Grav, UA: 1.015
Urobilinogen, UA: 0.2
pH, UA: 6

## 2015-08-21 NOTE — Progress Notes (Signed)
Subjective:   Patient ID: [name], female    DOB: [birth year], 82 y.o.   MRN: [id number]  [name] is a pleasant 82 y.o. year old female who presents to clinic today with Cough  on 08/21/2015  HPI:  Being treat for UTI- initially started on macrobid after being seen at [facility]. Per pt, received a call from them telling her to stop taking macrobid and start taking Keflex which she has been taking for 2 days now ( we do not have these records).  Still having some suprapubic pressure, a little hematuria and dysuria.  Also has a cough this morning with some post nasal drainage.  She would like to be evaluated for this since she is "just getting over the flu."  Current Outpatient Prescriptions on File Prior to Visit  Medication Sig Dispense Refill  . acetaminophen (TYLENOL) 500 MG tablet Take 1,000 mg by mouth every 6 (six) hours as needed for headache.    . carvedilol (COREG) 12.5 MG tablet TAKE 1 TABLET TWICE A DAY WITH FOOD 60 tablet 3  . cetirizine (ZYRTEC) 10 MG tablet Take 10 mg by mouth daily.     . cyanocobalamin (,VITAMIN B-12,) 1000 MCG/ML injection Inject 1 mL (1,000 mcg total) into the muscle every 30 (thirty) days. 1 mL 0  . disopyramide (NORPACE) 150 MG capsule TAKE 1 CAPSULE BY MOUTH TWICE DAILY 60 capsule 6  . furosemide (LASIX) 40 MG tablet Take 1 tablet (40 mg total) by mouth 2 (two) times daily. 60 tablet 10  . rosuvastatin (CRESTOR) 40 MG tablet Take 1 tablet (40 mg total) by mouth daily. 90 tablet 3  . warfarin (COUMADIN) 2 MG tablet Take as directed by anti-coagulation clinic 120 tablet 0  . fluticasone (FLONASE) 50 MCG/ACT nasal spray Place 2 sprays into both nostrils daily. (Patient not taking: Reported on 08/21/2015) 16 g 2  . potassium chloride (K-DUR,KLOR-CON) 10 MEQ tablet Take 1 tablet (10 mEq total) by mouth 2 (two) times daily with breakfast and lunch. (Patient not taking: Reported on 08/21/2015) 60 tablet 6  . [DISCONTINUED] hydrochlorothiazide  (HYDRODIURIL) 25 MG tablet Take 25 mg by mouth daily after breakfast.      No current facility-administered medications on file prior to visit.    Allergies  Allergen Reactions  . Codeine Nausea And Vomiting  . Morphine And Related Nausea And Vomiting  . Sulfonamide Derivatives Other (See Comments)    REACTION: Dry mouth    Past Medical History  Diagnosis Date  . Carotid artery stenosis 09/2007    60-79% bilateral (stable)  . Carotid artery stenosis 10/2008    40-59% R 60-79%   . Chest pain, unspecified   . Atrial fibrillation ([place])   . Sinus bradycardia   . Hypotension, unspecified     cardiac cath 2006..nonobstructive CAD 30-40s lesions.Marland KitchenETT 1/09 nondiagnostic due to poor HR response..Right Renal Cancer 2003  . Blood in stool   . Secondary cardiomyopathy, unspecified   . Chronic airway obstruction, not elsewhere classified   . Long term (current) use of anticoagulants   . Dysuria   . Edema   . Encounter for therapeutic drug monitoring   . Other malaise and fatigue   . Hypertrophic obstructive cardiomyopathy   . Urge incontinence   . Other acute sinusitis   . Other and unspecified coagulation defects   . Other screening mammogram   . Pre-operative cardiovascular examination   . Hemorrhage of rectum and anus   . Chronic kidney disease,  unspecified ([employer])   . Special screening for osteoporosis   . Hypertension 05/20/2011  . PONV (postoperative nausea and vomiting)   . Adjustment disorder with anxiety   . Coronary artery disease     non-obstructive by 2006 cath  . Automatic implantable cardioverter-defibrillator in situ   . High cholesterol   . Anginal pain ([place])   . Chronic bronchitis ([place])     "get it some; not q year" (11/11/2012)  . Exertional shortness of breath   . History of blood transfusion 04/2011    "after hip OR" (11/11/2012)  . Arthritis     "some in my hands" (11/11/2012)  . Osteoarthritis of right hip   . Frequent UTI     "get them a couple times/yr"  (11/11/2012)  . Chronic diastolic CHF (congestive heart failure) ([place]) 06/04/2013  . Renal cancer ([place]) 06/2001    Right  . Malignant neoplasm of kidney, except pelvis     Past Surgical History  Procedure Laterality Date  . Abdominal hysterectomy  1975    for benign causes  . Cardiac catheterization  2006  . Nephrectomy Right 06/2001     S/P RENAL CELL CANCER  . Cataract extraction w/ intraocular lens  implant, bilateral  01/2006-02-2006  . Insert / replace / remove pacemaker  05-01-11    02-28-05-/05-04-10-ICD-MEDTRONIC MAXIMAL DR  . Appendectomy    . Total hip arthroplasty Right 05/03/2011    Procedure: TOTAL HIP ARTHROPLASTY ANTERIOR APPROACH;  Surgeon: Mcarthur Rossetti;  Location: [facility];  Service: Orthopedics;  Laterality: Right;  Removal of Cannulated Screws Right Hip, Right Direct Anterior Hip Replacement  . Laparoscopic cholecystectomy  11/11/2012  . Bi-ventricular pacemaker upgrade  05/04/2010  . Joint replacement    . Cholecystectomy N/A 11/11/2012    Procedure: LAPAROSCOPIC CHOLECYSTECTOMY WITH INTRAOPERATIVE CHOLANGIOGRAM;  Surgeon: Imogene Burn. Georgette Dover, MD;  Location: [place];  Service: General;  Laterality: N/A;  . Laparoscopic lysis of adhesions N/A 11/11/2012    Procedure: LAPAROSCOPIC LYSIS OF ADHESIONS;  Surgeon: Imogene Burn. Georgette Dover, MD;  Location: [place] [facility];  Service: General;  Laterality: N/A;    Family History  Problem Relation Age of Onset  . Colon cancer Neg Hx   . Heart failure Mother   . Breast cancer Maternal Aunt 82  . Breast cancer Cousin     Social History   Social History  . Marital Status: Widowed    Spouse Name: N/A  . Number of Children: N/A  . Years of Education: N/A   Occupational History  . Retired    Social History Main Topics  . Smoking status: Former Smoker -- 0.50 packs/day for 40 years    Types: Cigarettes    Quit date: 05/06/2001  . Smokeless tobacco: Never Used  . Alcohol Use: No  . Drug Use: No  . Sexual Activity: No   Other Topics  Concern  . 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   ALCOHOL USE-NO   RETIRED   FORMER TOBACCO USE.....1/2 PACK X 80 YRS UNTIL 2003   Would desire CPR   The PMH, PSH, Social History, Family History, Medications, and allergies have been reviewed in [facility], and have been updated if relevant.   Review of Systems  Constitutional: Positive for chills.  HENT: Positive for congestion, postnasal drip and  rhinorrhea. Negative for sinus pressure.   Respiratory: Positive for cough. Negative for shortness of breath, wheezing and stridor.   Cardiovascular: Negative.   Gastrointestinal: Negative.   Genitourinary: Positive for dysuria, urgency, frequency and hematuria.  Musculoskeletal: Negative.   Skin: Negative.   All other systems reviewed and are negative.      Objective:    BP 122/76 mmHg  Pulse 62  Temp(Src) 97.8 F (36.6 C) (Oral)  Ht 5\' 7"  (1.702 m)  Wt 193 lb 8 oz (87.771 kg)  BMI 30.30 kg/m2  SpO2 97%   Physical Exam  Constitutional: She is oriented to person, place, and time. She appears well-developed and well-nourished. No distress.  HENT:  Head: Normocephalic.  Eyes: Conjunctivae are normal.  Cardiovascular: Normal rate.   Pulmonary/Chest: Effort normal and breath sounds normal. No respiratory distress. She has no wheezes.  Abdominal: Soft. She exhibits no distension. There is no tenderness.  Musculoskeletal: Normal range of motion.  Neurological: She is alert and oriented to person, place, and time. No cranial nerve deficit.  Skin: Skin is warm and dry. She is not diaphoretic.  Psychiatric: She has a normal mood and affect. Her behavior is normal. Judgment and thought content normal.  Nursing note and vitals reviewed.         Assessment & Plan:   Dysuria No Follow-up on file.

## 2015-08-21 NOTE — Telephone Encounter (Signed)
Pt was seen this morning by Dr Deborra Medina 08/21/15.

## 2015-08-21 NOTE — Progress Notes (Signed)
Pre visit review using our clinic review tool, if applicable. No additional management support is needed unless otherwise documented below in the visit note. 

## 2015-08-21 NOTE — Assessment & Plan Note (Signed)
Finish course of Keflex, request urine cx results from urgent care. UA here pos for RBCs, send for cx. The patient indicates understanding of these issues and agrees with the plan.

## 2015-08-21 NOTE — Assessment & Plan Note (Signed)
New- reassurance provided. Lung exam normal. Supportive care suggested. Call or return to clinic prn if these symptoms worsen or fail to improve as anticipated. The patient indicates understanding of these issues and agrees with the plan.

## 2015-08-21 NOTE — Telephone Encounter (Signed)
PLEASE NOTE: All timestamps contained within this report are represented as [place] Standard Time. CONFIDENTIALTY NOTICE: This fax transmission is intended only for the addressee. It contains information that is legally privileged, confidential or otherwise protected from use or disclosure. If you are not the intended recipient, you are strictly prohibited from reviewing, disclosing, copying using or disseminating any of this information or taking any action in reliance on or regarding this information. If you have received this fax in error, please notify us immediately by telephone so that we can arrange for its return to [place]. Phone: [phone], Toll-Free: [phone], Fax: [phone] Page: 1 of 3 Call Id: [id number] [place] Patient Name: [name] Gender: Female DOB: Sep 26, 1933 Age: 82 Y 1 M 9 D Return Phone Number: [phone] (Primary), [phone] (Secondary) Address: City/State/Zip: [place] - Client Client Site [place] - Day Physician [name] Contact Type Call Who Is Calling Patient / Member / Family / Caregiver Call Type Triage / Clinical Relationship To Patient Self Return Phone Number [phone] (Primary) Chief Complaint Medication Question (non symptomatic) Reason for Call Symptomatic / Request for [name] states she has a UTI. She was seen in UC. She was given Macrobid and was told to ok it through the Drs office. Appointment Disposition EMR Appointment Attempted - Not Scheduled Info pasted into Epic No Translation No No Triage Reason Other Nurse Assessment Nurse: Wynetta Emery, RN, Baker Janus Date/Time Eilene Ghazi Time): 08/18/2015 4:32:34 PM Confirm and document reason for call. If symptomatic, describe symptoms. You must click the next button to save  text entered. ---[name] was seen at [facility] today dx with UTI with blood --given Macrobid and they advised her check with MD before taking d/t heart problems. Has the patient traveled out of the country within the last 30 days? ---No Does the patient have any new or worsening symptoms? ---Yes Will a triage be completed? ---No Select reason for no triage. ---Other Please document clinical information provided and list any resource used. ---[name] wants to know if she can take it. Nurse will check with the MD on call. Guidelines Guideline Title Affirmed Question Affirmed Notes Nurse Date/Time (Eastern Time) Disp. Time Eilene Ghazi Time) Disposition Final User 08/18/2015 4:12:03 PM Attempt made - message left Ivin Booty 08/18/2015 4:36:20 PM Paged On Call back to [facility], RNBaker Janus 08/18/2015 4:47:52 PM Pharmacy Call Wynetta Emery, RN, Baker Janus Reason: called [facility] [phone] and spoke with Pharmacist and gave PLEASE NOTE: All timestamps contained within this report are represented as [place] Standard Time. CONFIDENTIALTY NOTICE: This fax transmission is intended only for the addressee. It contains information that is legally privileged, confidential or otherwise protected from use or disclosure. If you are not the intended recipient, you are strictly prohibited from reviewing, disclosing, copying using or disseminating any of this information or taking any action in reliance on or regarding this information. If you have received this fax in error, please notify us immediately by telephone so that we can arrange for its return to [place]. Phone: [phone], Toll-Free: [phone], Fax: [phone] Page: 2 of 3 Call Id: [id number] [place]. Time (Eastern Time) Disposition Final User order for the keflex see medication order. [name] notified of medication order 08/18/2015 4:48:00 PM Clinical Call Yes Wynetta Emery, RN, Baker Janus Verbal Orders/Maintenance Medications Medication Refill  Route Dosage Regime Duration Admin Instructions User Name Keflex 500mg  one tablet  po tid x 5 days #15 NR Oral one tablet TID 5 Days Wynetta Emery, RN, Baker Janus Comments User: Michele Rockers, RN Date/Time Eilene Ghazi Time): 08/18/2015 4:49:52 PM NOTE: office closed d/t Good Friday Holiday no appts today has appt Monday scheduled already with MD Paging DoctorName Phone DateTime Result/Outcome Message Type Notes Alysia Penna [phone] 08/18/2015 4:36:20 PM Paged On Call Back to Call Center Doctor Paged Call [place] Gustavus [place] 08/18/2015 4:44:55 PM Spoke with On Call - Outcome Notification Message Result given information provided by patient; MD Directives: Keflex 500mg  one tablet three times a day x 5 days #15 NR follow up with MD on Monday PLEASE NOTE: All timestamps contained within this report are represented as [place] Standard Time. CONFIDENTIALTY NOTICE: This fax transmission is intended only for the addressee. It contains information that is legally privileged, confidential or otherwise protected from use or disclosure. If you are not the intended recipient, you are strictly prohibited from reviewing, disclosing, copying using or disseminating any of this information or taking any action in reliance on or regarding this information. If you have received this fax in error, please notify us immediately by telephone so that we can arrange for its return to [place]. Phone: [phone], Toll-Free: [phone], Fax: [phone] Page: 3 of 3 Call Id: [id number] [place] [address], [place] [address] [phone] Fax: [phone] [place] - Day Date: 08/18/2015 From: QI Department To: [name] Please sign the order for the approved drug(s) given by our call center nurse on your behalf. Fax to [phone] within 5 business  days. Thank you. Date Eilene Ghazi Time): 08/18/2015 2:10:42 PM Triage RN: Michele Rockers, RN NAME: [name] PHONE NUMBER: [phone] (Primary), [phone] (Secondary) BIRTHDATE: Feb 29, 1934 ADDRESS: CITY/STATE/ZIP: [place] CALLER: Self NAME: Rx Given Medication Refill Route Dosage Regime Duration Admin Instructions Keflex 500mg  one tablet po tid x 5 days #15 NR Oral one tablet TID 5 Days MD Signature Date

## 2015-08-23 LAB — URINE CULTURE
Colony Count: NO GROWTH
Organism ID, Bacteria: NO GROWTH

## 2015-08-28 ENCOUNTER — Ambulatory Visit: Payer: PPO

## 2015-08-31 ENCOUNTER — Ambulatory Visit (INDEPENDENT_AMBULATORY_CARE_PROVIDER_SITE_OTHER): Payer: PPO | Admitting: *Deleted

## 2015-08-31 DIAGNOSIS — Z7901 Long term (current) use of anticoagulants: Secondary | ICD-10-CM | POA: Diagnosis not present

## 2015-08-31 DIAGNOSIS — E538 Deficiency of other specified B group vitamins: Secondary | ICD-10-CM

## 2015-08-31 DIAGNOSIS — I4891 Unspecified atrial fibrillation: Secondary | ICD-10-CM | POA: Diagnosis not present

## 2015-08-31 DIAGNOSIS — Z7902 Long term (current) use of antithrombotics/antiplatelets: Secondary | ICD-10-CM | POA: Diagnosis not present

## 2015-08-31 DIAGNOSIS — Z5181 Encounter for therapeutic drug level monitoring: Secondary | ICD-10-CM | POA: Diagnosis not present

## 2015-08-31 LAB — POCT INR: INR: 1.7

## 2015-08-31 MED ORDER — CYANOCOBALAMIN 1000 MCG/ML IJ SOLN
1000.0000 ug | Freq: Once | INTRAMUSCULAR | Status: AC
  Administered 2015-08-31: 1000 ug via INTRAMUSCULAR

## 2015-08-31 NOTE — Progress Notes (Signed)
Pre visit review using our clinic review tool, if applicable. No additional management support is needed unless otherwise documented below in the visit note. 

## 2015-09-12 ENCOUNTER — Ambulatory Visit: Payer: PPO | Admitting: Cardiovascular Disease

## 2015-09-18 ENCOUNTER — Ambulatory Visit: Payer: PPO

## 2015-09-19 ENCOUNTER — Ambulatory Visit (INDEPENDENT_AMBULATORY_CARE_PROVIDER_SITE_OTHER): Payer: PPO | Admitting: Family Medicine

## 2015-09-19 ENCOUNTER — Encounter: Payer: Self-pay | Admitting: Family Medicine

## 2015-09-19 VITALS — BP 124/70 | HR 76 | Temp 98.0°F | Wt 192.0 lb

## 2015-09-19 DIAGNOSIS — J069 Acute upper respiratory infection, unspecified: Secondary | ICD-10-CM

## 2015-09-19 MED ORDER — AZITHROMYCIN 250 MG PO TABS: ORAL_TABLET | ORAL | Status: AC

## 2015-09-19 MED ORDER — BENZONATATE 200 MG PO CAPS: 200.0000 mg | ORAL_CAPSULE | Freq: Two times a day (BID) | ORAL | Status: DC | PRN

## 2015-09-19 NOTE — Progress Notes (Addendum)
SUBJECTIVE:  [name] is a 82 y.o. female who complains of coryza, congestion, sore throat, swollen glands and dry cough for 10 days. She denies a history of anorexia and chest pain and denies a history of asthma. Patient denies smoke cigarettes.   Current Outpatient Prescriptions on File Prior to Visit  Medication Sig Dispense Refill  . acetaminophen (TYLENOL) 500 MG tablet Take 1,000 mg by mouth every 6 (six) hours as needed for headache.    . carvedilol (COREG) 12.5 MG tablet TAKE 1 TABLET TWICE A DAY WITH FOOD 60 tablet 3  . cetirizine (ZYRTEC) 10 MG tablet Take 10 mg by mouth daily.     . cyanocobalamin (,VITAMIN B-12,) 1000 MCG/ML injection Inject 1 mL (1,000 mcg total) into the muscle every 30 (thirty) days. 1 mL 0  . disopyramide (NORPACE) 150 MG capsule TAKE 1 CAPSULE BY MOUTH TWICE DAILY 60 capsule 6  . furosemide (LASIX) 40 MG tablet Take 1 tablet (40 mg total) by mouth 2 (two) times daily. 60 tablet 10  . rosuvastatin (CRESTOR) 40 MG tablet Take 1 tablet (40 mg total) by mouth daily. 90 tablet 3  . warfarin (COUMADIN) 2 MG tablet Take as directed by anti-coagulation clinic 120 tablet 0  . [DISCONTINUED] hydrochlorothiazide (HYDRODIURIL) 25 MG tablet Take 25 mg by mouth daily after breakfast.      No current facility-administered medications on file prior to visit.    Allergies  Allergen Reactions  . Codeine Nausea And Vomiting  . Morphine And Related Nausea And Vomiting  . Sulfonamide Derivatives Other (See Comments)    REACTION: Dry mouth    Past Medical History  Diagnosis Date  . Carotid artery stenosis 09/2007    60-79% bilateral (stable)  . Carotid artery stenosis 10/2008    40-59% R 60-79%   . Chest pain, unspecified   . Atrial fibrillation ([place])   . Sinus bradycardia   . Hypotension, unspecified     cardiac cath 2006..nonobstructive CAD 30-40s lesions.Marland KitchenETT 1/09 nondiagnostic due to poor HR response..Right Renal Cancer 2003  . Blood in stool   . Secondary  cardiomyopathy, unspecified   . Chronic airway obstruction, not elsewhere classified   . Long term (current) use of anticoagulants   . Dysuria   . Edema   . Encounter for therapeutic drug monitoring   . Other malaise and fatigue   . Hypertrophic obstructive cardiomyopathy   . Urge incontinence   . Other acute sinusitis   . Other and unspecified coagulation defects   . Other screening mammogram   . Pre-operative cardiovascular examination   . Hemorrhage of rectum and anus   . Chronic kidney disease, unspecified ([place])   . Special screening for osteoporosis   . Hypertension 05/20/2011  . PONV (postoperative nausea and vomiting)   . Adjustment disorder with anxiety   . Coronary artery disease     non-obstructive by 2006 cath  . Automatic implantable cardioverter-defibrillator in situ   . High cholesterol   . Anginal pain ([place])   . Chronic bronchitis ([place])     "get it some; not q year" (11/11/2012)  . Exertional shortness of breath   . History of blood transfusion 04/2011    "after hip OR" (11/11/2012)  . Arthritis     "some in my hands" (11/11/2012)  . Osteoarthritis of right hip   . Frequent UTI     "get them a couple times/yr" (11/11/2012)  . Chronic diastolic CHF (congestive heart failure) ([place]) 06/04/2013  . Renal  cancer ([place]) 06/2001    Right  . Malignant neoplasm of kidney, except pelvis     Past Surgical History  Procedure Laterality Date  . Abdominal hysterectomy  1975    for benign causes  . Cardiac catheterization  2006  . Nephrectomy Right 06/2001     S/P RENAL CELL CANCER  . Cataract extraction w/ intraocular lens  implant, bilateral  01/2006-02-2006  . Insert / replace / remove pacemaker  05-01-11    02-28-05-/05-04-10-ICD-MEDTRONIC MAXIMAL DR  . Appendectomy    . Total hip arthroplasty Right 05/03/2011    Procedure: TOTAL HIP ARTHROPLASTY ANTERIOR APPROACH;  Surgeon: Mcarthur Rossetti;  Location: [facility];  Service: Orthopedics;  Laterality: Right;  Removal of  Cannulated Screws Right Hip, Right Direct Anterior Hip Replacement  . Laparoscopic cholecystectomy  11/11/2012  . Bi-ventricular pacemaker upgrade  05/04/2010  . Joint replacement    . Cholecystectomy N/A 11/11/2012    Procedure: LAPAROSCOPIC CHOLECYSTECTOMY WITH INTRAOPERATIVE CHOLANGIOGRAM;  Surgeon: Imogene Burn. Georgette Dover, MD;  Location: [place];  Service: General;  Laterality: N/A;  . Laparoscopic lysis of adhesions N/A 11/11/2012    Procedure: LAPAROSCOPIC LYSIS OF ADHESIONS;  Surgeon: Imogene Burn. Georgette Dover, MD;  Location: [place] [facility];  Service: General;  Laterality: N/A;    Family History  Problem Relation Age of Onset  . Colon cancer Neg Hx   . Heart failure Mother   . Breast cancer Maternal Aunt 82  . Breast cancer Cousin     Social History   Social History  . Marital Status: Widowed    Spouse Name: N/A  . Number of Children: N/A  . Years of Education: N/A   Occupational History  . Retired    Social History Main Topics  . Smoking status: Former Smoker -- 0.50 packs/day for 40 years    Types: Cigarettes    Quit date: 05/06/2001  . Smokeless tobacco: Never Used  . Alcohol Use: No  . Drug Use: No  . Sexual Activity: No   Other Topics Concern  . 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   ALCOHOL USE-NO   RETIRED   FORMER TOBACCO USE.....1/2 PACK X 13 YRS UNTIL 2003   Would desire CPR   The PMH, PSH, Social History, Family History, Medications, and allergies have been reviewed in [facility], and have been updated if relevant.  OBJECTIVE: BP 124/70 mmHg  Pulse 76  Temp(Src) 98 F (36.7 C) (Oral)  Wt 192 lb (87.091 kg)  SpO2 97%  She appears well, vital signs are as noted. Ears normal.  Throat and pharynx normal.  Neck supple. No adenopathy in the neck. Nose is congested. Sinuses  tender. Wheezes throughout  ASSESSMENT:   sinusitis and bronchitis  PLAN: Given duration and progression of symptoms, will treat for bacterial sinusitis/bronchitis with zpack.  Symptomatic therapy suggested: push fluids, rest and return office visit prn if symptoms persist or worsen. Call or return to clinic prn if these symptoms worsen or fail to improve as anticipated.  Of note, she does take coumadin and has appt scheduled with coumadin clinic for next week.  Discussed bleeding precautions, and flag sent to coumadin RN alerting her that Ms. Demchak has started Azithromycin today. The patient indicates understanding of these issues and agrees with the plan.

## 2015-09-19 NOTE — Progress Notes (Signed)
Pre visit review using our clinic review tool, if applicable. No additional management support is needed unless otherwise documented below in the visit note. 

## 2015-09-21 ENCOUNTER — Encounter: Payer: Self-pay | Admitting: Internal Medicine

## 2015-09-21 ENCOUNTER — Ambulatory Visit (INDEPENDENT_AMBULATORY_CARE_PROVIDER_SITE_OTHER): Payer: PPO | Admitting: Internal Medicine

## 2015-09-21 VITALS — BP 144/92 | HR 63 | Ht 67.0 in | Wt 191.5 lb

## 2015-09-21 DIAGNOSIS — I429 Cardiomyopathy, unspecified: Secondary | ICD-10-CM

## 2015-09-21 DIAGNOSIS — I428 Other cardiomyopathies: Secondary | ICD-10-CM

## 2015-09-21 DIAGNOSIS — I4891 Unspecified atrial fibrillation: Secondary | ICD-10-CM | POA: Diagnosis not present

## 2015-09-21 NOTE — Progress Notes (Signed)
Patient Care Team: Lucille Passy, MD as PCP - General Minna Merritts, MD as Consulting Physician (Cardiology) Deboraha Sprang, MD as Consulting Physician (Cardiology) Katha Cabal, MD as Consulting Physician (Vascular Surgery)   HPI  [name] is a 82 y.o. female Seen in followup for nonischemic cardiomyopathy for which she has a primary prevention ICD implanted initially 2006 with generator replacement about 5 years ago.  When seen a month ago, there was tenderness raising the question of infection   She is seen today to look at the wound  Her pocket has been looked at a couple of times over the last 2-1/2 months and it is been increasingly less tender.   She has paroxysmal atrial fibrillation; previously this was treated with disopyramide but was switched earlier this year to flecainide. It was however complicated by side effects and she went back on disopyramide. ( the d/c summary 4/16 was WRONG-describing the use of amiodarone)  Echocardiogram 2015 demonstrated normal LV function moderate-severe MR moderate TR.  Echo 4/16 was reviewed and was similar with RV enlargement and PA pressure of 75-mod severe pulm HTN  CAth 2006 without obstructive disease  She has a complicated medical history in addition which includes renal cell carcinoma with prior resection, chronic renal insufficiency and COPD.     3/17 Creatinine was 1.3 K 4.9    Past Medical History  Diagnosis Date  . Carotid artery stenosis 09/2007    60-79% bilateral (stable)  . Carotid artery stenosis 10/2008    40-59% R 60-79%   . Chest pain, unspecified   . Atrial fibrillation ([place])   . Sinus bradycardia   . Hypotension, unspecified     cardiac cath 2006..nonobstructive CAD 30-40s lesions.Marland KitchenETT 1/09 nondiagnostic due to poor HR response..Right Renal Cancer 2003  . Blood in stool   . Secondary cardiomyopathy, unspecified   . Chronic airway obstruction, not elsewhere classified   . Long term  (current) use of anticoagulants   . Dysuria   . Edema   . Encounter for therapeutic drug monitoring   . Other malaise and fatigue   . Hypertrophic obstructive cardiomyopathy   . Urge incontinence   . Other acute sinusitis   . Other and unspecified coagulation defects   . Other screening mammogram   . Pre-operative cardiovascular examination   . Hemorrhage of rectum and anus   . Chronic kidney disease, unspecified ([place])   . Special screening for osteoporosis   . Hypertension 05/20/2011  . PONV (postoperative nausea and vomiting)   . Adjustment disorder with anxiety   . Coronary artery disease     non-obstructive by 2006 cath  . Automatic implantable cardioverter-defibrillator in situ   . High cholesterol   . Anginal pain ([place])   . Chronic bronchitis ([place])     "get it some; not q year" (11/11/2012)  . Exertional shortness of breath   . History of blood transfusion 04/2011    "after hip OR" (11/11/2012)  . Arthritis     "some in my hands" (11/11/2012)  . Osteoarthritis of right hip   . Frequent UTI     "get them a couple times/yr" (11/11/2012)  . Chronic diastolic CHF (congestive heart failure) ([place]) 06/04/2013  . Renal cancer ([place]) 06/2001    Right  . Malignant neoplasm of kidney, except pelvis     Past Surgical History  Procedure Laterality Date  . Abdominal hysterectomy  1975    for benign causes  .  Cardiac catheterization  2006  . Nephrectomy Right 06/2001     S/P RENAL CELL CANCER  . Cataract extraction w/ intraocular lens  implant, bilateral  01/2006-02-2006  . Insert / replace / remove pacemaker  05-01-11    02-28-05-/05-04-10-ICD-MEDTRONIC MAXIMAL DR  . Appendectomy    . Total hip arthroplasty Right 05/03/2011    Procedure: TOTAL HIP ARTHROPLASTY ANTERIOR APPROACH;  Surgeon: Mcarthur Rossetti;  Location: [facility];  Service: Orthopedics;  Laterality: Right;  Removal of Cannulated Screws Right Hip, Right Direct Anterior Hip Replacement  . Laparoscopic cholecystectomy   11/11/2012  . Bi-ventricular pacemaker upgrade  05/04/2010  . Joint replacement    . Cholecystectomy N/A 11/11/2012    Procedure: LAPAROSCOPIC CHOLECYSTECTOMY WITH INTRAOPERATIVE CHOLANGIOGRAM;  Surgeon: Imogene Burn. Georgette Dover, MD;  Location: [facility];  Service: General;  Laterality: N/A;  . Laparoscopic lysis of adhesions N/A 11/11/2012    Procedure: LAPAROSCOPIC LYSIS OF ADHESIONS;  Surgeon: Imogene Burn. Georgette Dover, MD;  Location: [facility];  Service: General;  Laterality: N/A;    Current Outpatient Prescriptions  Medication Sig Dispense Refill  . acetaminophen (TYLENOL) 500 MG tablet Take 1,000 mg by mouth every 6 (six) hours as needed for headache.    Marland Kitchen azithromycin (ZITHROMAX Z-PAK) 250 MG tablet Take 2 tablets (500 mg) on  Day 1,  followed by 1 tablet (250 mg) once daily on Days 2 through 5. 6 each 0  . benzonatate (TESSALON) 200 MG capsule Take 1 capsule (200 mg total) by mouth 2 (two) times daily as needed for cough. 20 capsule 0  . carvedilol (COREG) 12.5 MG tablet TAKE 1 TABLET TWICE A DAY WITH FOOD 60 tablet 3  . cetirizine (ZYRTEC) 10 MG tablet Take 10 mg by mouth daily.     . cyanocobalamin (,VITAMIN B-12,) 1000 MCG/ML injection Inject 1 mL (1,000 mcg total) into the muscle every 30 (thirty) days. 1 mL 0  . disopyramide (NORPACE) 150 MG capsule TAKE 1 CAPSULE BY MOUTH TWICE DAILY 60 capsule 6  . furosemide (LASIX) 40 MG tablet Take 1 tablet (40 mg total) by mouth 2 (two) times daily. 60 tablet 10  . rosuvastatin (CRESTOR) 40 MG tablet Take 1 tablet (40 mg total) by mouth daily. 90 tablet 3  . warfarin (COUMADIN) 2 MG tablet Take as directed by anti-coagulation clinic 120 tablet 0  . [DISCONTINUED] hydrochlorothiazide (HYDRODIURIL) 25 MG tablet Take 25 mg by mouth daily after breakfast.      No current facility-administered medications for this visit.    Allergies  Allergen Reactions  . Codeine Nausea And Vomiting  . Morphine And Related Nausea And Vomiting  . Sulfonamide Derivatives Other (See  Comments)    REACTION: Dry mouth    Review of Systems negative except from HPI and PMH  Physical Exam BP 144/92 mmHg  Pulse 63  Ht 5\' 7"  (1.702 m)  Wt 191 lb 8 oz (86.864 kg)  BMI 29.99 kg/m2 Well developed and well nourished in no acute distress HENT normal E scleral and icterus clear Neck Supple JVP flat; carotids brisk and full Device pocket well healed; without hematoma   There is no tethering there is tenderness along the superior margin of her device. There is a minimal warmth but no erythema in the incision line of her device generator replacement Clear to ausculation { Regular rate and rhythm, no murmurs gallops or rub Soft with active bowel sounds No clubbing cyanosis  Edema Alert and oriented, grossly normal motor and sensory function Skin Warm and  Dry  ECG demonstrates  AV pacing  Assessment and  Plan  Nonischemic cardiomyopathy with interval normalization of LV function  Sinus node dysfunction  Atrial fibrillation   High risk medication surveillance-disopyramide  Device related chest pain   Renal insufficiency GFR  grade 3  10/16  Implantable defibrillator   Her pocket is better.   Her device is approaching ERI. I have demonstrated tones.  I fear that it generator replacement we will find that the device has a low-grade infection. Hopefully I'm wrong.

## 2015-09-21 NOTE — Patient Instructions (Addendum)
Medication Instructions: - Increase lasix (furosemide) to 80 mg in the morning & 40 mg in the evening x 3 days then resume your normal dosing.  Labwork: - none  Procedures/Testing: - Your physician has requested that you have an echocardiogram. Echocardiography is a painless test that uses sound waves to create images of your heart. It provides your doctor with information about the size and shape of your heart and how well your heart's chambers and valves are working. This procedure takes approximately one hour. There are no restrictions for this procedure.  Follow-Up: - Your physician recommends that you schedule a follow-up appointment in: 3 months with Dr. Caryl Comes.  Any Additional Special Instructions Will Be Listed Below (If Applicable).     If you need a refill on your cardiac medications before your next appointment, please call your pharmacy.

## 2015-09-25 LAB — CUP PACEART INCLINIC DEVICE CHECK
Date Time Interrogation Session: 20170522131543
Implantable Lead Implant Date: 20061026
Implantable Lead Implant Date: 20111230
Implantable Lead Location: 753859
Implantable Lead Location: 753860
Implantable Lead Model: 5076
Implantable Lead Model: 6947

## 2015-09-28 ENCOUNTER — Ambulatory Visit (INDEPENDENT_AMBULATORY_CARE_PROVIDER_SITE_OTHER): Payer: PPO | Admitting: *Deleted

## 2015-09-28 DIAGNOSIS — I4891 Unspecified atrial fibrillation: Secondary | ICD-10-CM | POA: Diagnosis not present

## 2015-09-28 DIAGNOSIS — Z7902 Long term (current) use of antithrombotics/antiplatelets: Secondary | ICD-10-CM | POA: Diagnosis not present

## 2015-09-28 DIAGNOSIS — Z7901 Long term (current) use of anticoagulants: Secondary | ICD-10-CM

## 2015-09-28 DIAGNOSIS — Z5181 Encounter for therapeutic drug level monitoring: Secondary | ICD-10-CM

## 2015-09-28 LAB — POCT INR: INR: 2.2

## 2015-09-28 NOTE — Progress Notes (Signed)
Pre visit review using our clinic review tool, if applicable. No additional management support is needed unless otherwise documented below in the visit note. 

## 2015-10-03 ENCOUNTER — Encounter: Payer: PPO | Admitting: Internal Medicine

## 2015-10-03 ENCOUNTER — Telehealth: Payer: Self-pay | Admitting: Family Medicine

## 2015-10-03 NOTE — Telephone Encounter (Signed)
Pt called, complained of Weakness, declined [facility], scheduled next available B-12 injection, stated that she thought the B-12 would help her.  [phone]

## 2015-10-04 ENCOUNTER — Ambulatory Visit (INDEPENDENT_AMBULATORY_CARE_PROVIDER_SITE_OTHER): Payer: PPO

## 2015-10-04 DIAGNOSIS — E538 Deficiency of other specified B group vitamins: Secondary | ICD-10-CM

## 2015-10-04 MED ORDER — CYANOCOBALAMIN 1000 MCG/ML IJ SOLN
1000.0000 ug | Freq: Once | INTRAMUSCULAR | Status: AC
Start: 2015-10-04 — End: 2015-10-04
  Administered 2015-10-04: 1000 ug via INTRAMUSCULAR

## 2015-10-05 ENCOUNTER — Ambulatory Visit: Payer: PPO

## 2015-10-06 ENCOUNTER — Other Ambulatory Visit: Payer: Self-pay

## 2015-10-06 ENCOUNTER — Ambulatory Visit (INDEPENDENT_AMBULATORY_CARE_PROVIDER_SITE_OTHER): Payer: PPO

## 2015-10-06 DIAGNOSIS — I429 Cardiomyopathy, unspecified: Secondary | ICD-10-CM

## 2015-10-06 DIAGNOSIS — I428 Other cardiomyopathies: Secondary | ICD-10-CM

## 2015-10-09 ENCOUNTER — Other Ambulatory Visit: Payer: Self-pay | Admitting: *Deleted

## 2015-10-09 MED ORDER — CARVEDILOL 12.5 MG PO TABS: 12.5000 mg | ORAL_TABLET | Freq: Two times a day (BID) | ORAL | Status: DC

## 2015-10-20 ENCOUNTER — Other Ambulatory Visit: Payer: Self-pay | Admitting: Family Medicine

## 2015-10-31 ENCOUNTER — Other Ambulatory Visit: Payer: PPO

## 2015-10-31 ENCOUNTER — Ambulatory Visit: Payer: PPO

## 2015-11-02 ENCOUNTER — Encounter: Payer: Self-pay | Admitting: Radiology

## 2015-11-02 ENCOUNTER — Ambulatory Visit (INDEPENDENT_AMBULATORY_CARE_PROVIDER_SITE_OTHER): Payer: PPO | Admitting: *Deleted

## 2015-11-02 ENCOUNTER — Other Ambulatory Visit (INDEPENDENT_AMBULATORY_CARE_PROVIDER_SITE_OTHER): Payer: PPO

## 2015-11-02 DIAGNOSIS — Z5181 Encounter for therapeutic drug level monitoring: Secondary | ICD-10-CM | POA: Diagnosis not present

## 2015-11-02 DIAGNOSIS — Z7901 Long term (current) use of anticoagulants: Secondary | ICD-10-CM | POA: Diagnosis not present

## 2015-11-02 DIAGNOSIS — I4891 Unspecified atrial fibrillation: Secondary | ICD-10-CM | POA: Diagnosis not present

## 2015-11-02 DIAGNOSIS — E538 Deficiency of other specified B group vitamins: Secondary | ICD-10-CM | POA: Diagnosis not present

## 2015-11-02 LAB — POCT INR: INR: 2.1

## 2015-11-02 MED ORDER — CYANOCOBALAMIN 1000 MCG/ML IJ SOLN
1000.0000 ug | Freq: Once | INTRAMUSCULAR | Status: AC
  Administered 2015-11-02: 1000 ug via INTRAMUSCULAR

## 2015-11-21 ENCOUNTER — Ambulatory Visit (INDEPENDENT_AMBULATORY_CARE_PROVIDER_SITE_OTHER): Payer: PPO | Admitting: Family Medicine

## 2015-11-21 ENCOUNTER — Encounter: Payer: Self-pay | Admitting: Family Medicine

## 2015-11-21 VITALS — BP 138/72 | HR 75 | Temp 98.0°F | Wt 190.2 lb

## 2015-11-21 DIAGNOSIS — R300 Dysuria: Secondary | ICD-10-CM | POA: Diagnosis not present

## 2015-11-21 LAB — POC URINALSYSI DIPSTICK (AUTOMATED)
Bilirubin, UA: NEGATIVE
Glucose, UA: NEGATIVE
Ketones, UA: NEGATIVE
Nitrite, UA: NEGATIVE
Protein, UA: NEGATIVE
Spec Grav, UA: 1.02
Urobilinogen, UA: 0.2
pH, UA: 6

## 2015-11-21 MED ORDER — CEPHALEXIN 500 MG PO CAPS: 500.0000 mg | ORAL_CAPSULE | Freq: Two times a day (BID) | ORAL | Status: DC

## 2015-11-21 NOTE — Progress Notes (Signed)
Pre visit review using our clinic review tool, if applicable. No additional management support is needed unless otherwise documented below in the visit note. 

## 2015-11-21 NOTE — Progress Notes (Signed)
SUBJECTIVE: [name] is a 82 y.o. female who complains of urinary frequency, urgency and dysuria x 7 days, without flank pain, fever, chills, or abnormal vaginal discharge or bleeding.   Current Outpatient Prescriptions on File Prior to Visit  Medication Sig Dispense Refill  . acetaminophen (TYLENOL) 500 MG tablet Take 1,000 mg by mouth every 6 (six) hours as needed for headache.    . carvedilol (COREG) 12.5 MG tablet Take 1 tablet (12.5 mg total) by mouth 2 (two) times daily with a meal. 60 tablet 10  . cetirizine (ZYRTEC) 10 MG tablet Take 10 mg by mouth daily.     . disopyramide (NORPACE) 150 MG capsule TAKE 1 CAPSULE BY MOUTH TWICE DAILY 60 capsule 6  . furosemide (LASIX) 40 MG tablet Take 1 tablet (40 mg total) by mouth 2 (two) times daily. 60 tablet 10  . rosuvastatin (CRESTOR) 40 MG tablet Take 1 tablet (40 mg total) by mouth daily. 90 tablet 3  . warfarin (COUMADIN) 2 MG tablet TAKE AS DIRECTED BY MOUTH ANTI-COAGULATION CLINIC 120 tablet 3  . [DISCONTINUED] hydrochlorothiazide (HYDRODIURIL) 25 MG tablet Take 25 mg by mouth daily after breakfast.      No current facility-administered medications on file prior to visit.    Allergies  Allergen Reactions  . Codeine Nausea And Vomiting  . Morphine And Related Nausea And Vomiting  . Sulfonamide Derivatives Other (See Comments)    REACTION: Dry mouth    Past Medical History  Diagnosis Date  . Carotid artery stenosis 09/2007    60-79% bilateral (stable)  . Carotid artery stenosis 10/2008    40-59% R 60-79%   . Chest pain, unspecified   . Atrial fibrillation ([place])   . Sinus bradycardia   . Hypotension, unspecified     cardiac cath 2006..nonobstructive CAD 30-40s lesions.Marland KitchenETT 1/09 nondiagnostic due to poor HR response..Right Renal Cancer 2003  . Blood in stool   . Secondary cardiomyopathy, unspecified   . Chronic airway obstruction, not elsewhere classified   . Long term (current) use of anticoagulants   . Dysuria   .  Edema   . Encounter for therapeutic drug monitoring   . Other malaise and fatigue   . Hypertrophic obstructive cardiomyopathy   . Urge incontinence   . Other acute sinusitis   . Other and unspecified coagulation defects   . Other screening mammogram   . Pre-operative cardiovascular examination   . Hemorrhage of rectum and anus   . Chronic kidney disease, unspecified ([place])   . Special screening for osteoporosis   . Hypertension 05/20/2011  . PONV (postoperative nausea and vomiting)   . Adjustment disorder with anxiety   . Coronary artery disease     non-obstructive by 2006 cath  . Automatic implantable cardioverter-defibrillator in situ   . High cholesterol   . Anginal pain ([place])   . Chronic bronchitis ([place])     "get it some; not q year" (11/11/2012)  . Exertional shortness of breath   . History of blood transfusion 04/2011    "after hip OR" (11/11/2012)  . Arthritis     "some in my hands" (11/11/2012)  . Osteoarthritis of right hip   . Frequent UTI     "get them a couple times/yr" (11/11/2012)  . Chronic diastolic CHF (congestive heart failure) ([place]) 06/04/2013  . Renal cancer ([place]) 06/2001    Right  . Malignant neoplasm of kidney, except pelvis     Past Surgical History  Procedure Laterality Date  . Abdominal  hysterectomy  1975    for benign causes  . Cardiac catheterization  2006  . Nephrectomy Right 06/2001     S/P RENAL CELL CANCER  . Cataract extraction w/ intraocular lens  implant, bilateral  01/2006-02-2006  . Insert / replace / remove pacemaker  05-01-11    02-28-05-/05-04-10-ICD-MEDTRONIC MAXIMAL DR  . Appendectomy    . Total hip arthroplasty Right 05/03/2011    Procedure: TOTAL HIP ARTHROPLASTY ANTERIOR APPROACH;  Surgeon: Mcarthur Rossetti;  [facility]: [facility];  Service: Orthopedics;  Laterality: Right;  Removal of Cannulated Screws Right Hip, Right Direct Anterior Hip Replacement  . Laparoscopic cholecystectomy  11/11/2012  . Bi-ventricular pacemaker upgrade   05/04/2010  . Joint replacement    . Cholecystectomy N/A 11/11/2012    Procedure: LAPAROSCOPIC CHOLECYSTECTOMY WITH INTRAOPERATIVE CHOLANGIOGRAM;  Surgeon: Imogene Burn. Georgette Dover, MD;  Location: [facility];  Service: General;  Laterality: N/A;  . Laparoscopic lysis of adhesions N/A 11/11/2012    Procedure: LAPAROSCOPIC LYSIS OF ADHESIONS;  Surgeon: Imogene Burn. Georgette Dover, MD;  Location: [facility];  Service: General;  Laterality: N/A;    Family History  Problem Relation Age of Onset  . Colon cancer Neg Hx   . Heart failure Mother   . Breast cancer Maternal Aunt 82  . Breast cancer Cousin     Social History   Social History  . Marital Status: Widowed    Spouse Name: N/A  . Number of Children: N/A  . Years of Education: N/A   Occupational History  . Retired    Social History Main Topics  . Smoking status: Former Smoker -- 0.50 packs/day for 40 years    Types: Cigarettes    Quit date: 05/06/2001  . Smokeless tobacco: Never Used  . Alcohol Use: No  . Drug Use: No  . Sexual Activity: No   Other Topics Concern  . 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   ALCOHOL USE-NO   RETIRED   FORMER TOBACCO USE.....1/2 PACK X 41 YRS UNTIL 2003   Would desire CPR   The PMH, PSH, Social History, Family History, Medications, and allergies have been reviewed in [facility], and have been updated if relevant.  OBJECTIVE:  BP 138/72 mmHg  Pulse 75  Temp(Src) 98 F (36.7 C) (Oral)  Wt 190 lb 4 oz (86.297 kg)  SpO2 96%  Appears well, in no apparent distress.  Vital signs are normal. The abdomen is soft without tenderness, guarding, mass, rebound or organomegaly. No CVA tenderness or inguinal adenopathy noted. Urine dipstick shows positive for WBC's, positive for RBC's and positive for leukocytes.   ASSESSMENT: UTI uncomplicated without evidence of  pyelonephritis  PLAN: Treatment per orders - keflex 500 mg twice daily x 7 days,  also push fluids, may use Pyridium OTC prn. Call or return to clinic prn if these symptoms worsen or fail to improve as anticipated.

## 2015-11-23 LAB — URINE CULTURE
Colony Count: >=100000
Organism ID, Bacteria: 10000

## 2015-11-30 ENCOUNTER — Other Ambulatory Visit: Payer: PPO

## 2015-12-04 ENCOUNTER — Telehealth: Payer: Self-pay

## 2015-12-04 ENCOUNTER — Encounter: Payer: Self-pay | Admitting: Cardiovascular Disease

## 2015-12-04 ENCOUNTER — Telehealth: Payer: Self-pay | Admitting: Cardiovascular Disease

## 2015-12-04 ENCOUNTER — Ambulatory Visit (INDEPENDENT_AMBULATORY_CARE_PROVIDER_SITE_OTHER): Payer: PPO | Admitting: Cardiovascular Disease

## 2015-12-04 ENCOUNTER — Other Ambulatory Visit (INDEPENDENT_AMBULATORY_CARE_PROVIDER_SITE_OTHER): Payer: PPO

## 2015-12-04 VITALS — BP 120/56 | HR 74 | Ht 67.0 in | Wt 193.5 lb

## 2015-12-04 DIAGNOSIS — I5032 Chronic diastolic (congestive) heart failure: Secondary | ICD-10-CM

## 2015-12-04 DIAGNOSIS — Z7901 Long term (current) use of anticoagulants: Secondary | ICD-10-CM

## 2015-12-04 DIAGNOSIS — I6523 Occlusion and stenosis of bilateral carotid arteries: Secondary | ICD-10-CM | POA: Diagnosis not present

## 2015-12-04 DIAGNOSIS — E785 Hyperlipidemia, unspecified: Secondary | ICD-10-CM | POA: Diagnosis not present

## 2015-12-04 DIAGNOSIS — I272 Other secondary pulmonary hypertension: Secondary | ICD-10-CM

## 2015-12-04 DIAGNOSIS — J432 Centrilobular emphysema: Secondary | ICD-10-CM

## 2015-12-04 DIAGNOSIS — I4891 Unspecified atrial fibrillation: Secondary | ICD-10-CM

## 2015-12-04 DIAGNOSIS — R0602 Shortness of breath: Secondary | ICD-10-CM

## 2015-12-04 DIAGNOSIS — Z5181 Encounter for therapeutic drug level monitoring: Secondary | ICD-10-CM | POA: Diagnosis not present

## 2015-12-04 LAB — POCT INR: INR: 2

## 2015-12-04 MED ORDER — SILDENAFIL CITRATE 20 MG PO TABS: 20.0000 mg | ORAL_TABLET | Freq: Three times a day (TID) | ORAL | 6 refills | Status: DC

## 2015-12-04 NOTE — Telephone Encounter (Signed)
[facility] calling to tell us a prescription we sent in needs PA  Sildenafil 20 mg  envsion is who need to contact [phone]

## 2015-12-04 NOTE — Patient Instructions (Addendum)
Medication Instructions:   Please start revatio 20 mg three times a day For breathing   Follow-Up: It was a pleasure seeing you in the office today. Please call us if you have new issues that need to be addressed before your next appt.  [phone]  Your physician wants you to follow-up in: 3 months.  You will receive a reminder letter in the mail two months in advance. If you don't receive a letter, please call our office to schedule the follow-up appointment.  If you need a refill on your cardiac medications before your next appointment, please call your pharmacy.

## 2015-12-04 NOTE — Progress Notes (Signed)
Cardiology Office Note  Date:  12/04/2015   ID:  [name], DOB 10/08/1933, MRN [id number]  PCP:  [name], MD   Chief Complaint  Patient presents with  . Other    Pt. c/o chest pain and LE edema and shortness of breath. Meds reviewed by the patient verbally.     HPI:  [name] is a 82 year old woman with a history of syncope, COPD, as well as symptomatic bradycardia.  She is status post ICD.  history of nonobstructive coronary artery disease with chronic chest pain, bilateral carotid artery stenosis 60-70% disease on the left, paroxysmal atrial fibrillation, renal cell carcinoma status post resection of her right kidney with residual chronic renal insufficiency, and COPD, stopped smoking over 10 years ago.   She has chronic leg pain She presents for routine follow-up of her carotid arterial disease, atrial fibrillation. She has a single kidney, creatinine 1.4 She presents for recent episode of chest pain, chronic shortness of breath Echocardiogram 2016 and most recently 2017 with elevated right heart pressures, 70 mmHg  On her visit today, she reports having Chest pain today while been seen by primary care Symptoms lasted for about 4 minutes Pain under her left breast Unclear if it was reproducible, with palpation She has been having some pain that is reproducible with palpation  She has periodic symptoms of chest tightness, shortness of breath Smoked 30 years, quit 20 years, Takes Lasix 40 mg twice a day, Previously recommended to take higher dose Lasix for several days in a row if she develops symptoms by Dr. Caryl Comes Has chronic leg pain  Echocardiogram results reviewed with her in detail, elevated right heart pressures, normal ejection fraction  In general she is sedentary, no regular exercise program Defibrillator download on today's visit as she is concerned about her battery No significant atrial fibrillation or atrial tachycardia episodes, perhaps longer episode  in January 2017 otherwise 4 episodes lasting less than 2 hours She is happy with her norpace   Carotid ultrasound reviewed with her showing stable moderate to severe bilateral carotid disease Other past medical history  She did try flecainide in the past and she had significant side effects as well as breakthrough arrhythmia.  right hip replacement on May 03 2011.    Chronic dizziness since the summer of 2014 . Symptoms were worse in December 2014  Echocardiogram showed normal ejection fraction, moderate mitral valve regurgitation, moderate TR, moderately elevated right ventricular systolic pressures.   stress test in March 2011.    Last carotid u/s 40-59% R 60-79%. No neuro symptoms.     Cardiac cath 2006    --non-obstructive CAD 30-40s lesions She reports having repeat catheterization since that time with no significant progression of her disease  PMH:   has a past medical history of Adjustment disorder with anxiety; Anginal pain ([place]); Arthritis; Atrial fibrillation ([place]); Automatic implantable cardioverter-defibrillator in situ; Blood in stool; Carotid artery stenosis (09/2007); Carotid artery stenosis (10/2008); Chest pain, unspecified; Chronic airway obstruction, not elsewhere classified; Chronic bronchitis ([place]); Chronic diastolic CHF (congestive heart failure) ([place]) (06/04/2013); Chronic kidney disease, unspecified ([place]); Coronary artery disease; Dysuria; Edema; Encounter for therapeutic drug monitoring; Exertional shortness of breath; Frequent UTI; Hemorrhage of rectum and anus; High cholesterol; History of blood transfusion (04/2011); Hypertension (05/20/2011); Hypertrophic obstructive cardiomyopathy; Hypotension, unspecified; Long term (current) use of anticoagulants; Malignant neoplasm of kidney, except pelvis; Osteoarthritis of right hip; Other acute sinusitis; Other and unspecified coagulation defects; Other malaise and fatigue; Other screening mammogram; PONV (postoperative  nausea and vomiting); Pre-operative cardiovascular examination; Renal cancer ([place]) (06/2001); Secondary cardiomyopathy, unspecified; Sinus bradycardia; Special screening for osteoporosis; and Urge incontinence.  PSH:    Past Surgical History:  Procedure Laterality Date  . ABDOMINAL HYSTERECTOMY  1975   for benign causes  . APPENDECTOMY    . BI-VENTRICULAR PACEMAKER UPGRADE  05/04/2010  . CARDIAC CATHETERIZATION  2006  . CATARACT EXTRACTION W/ INTRAOCULAR LENS  IMPLANT, BILATERAL  01/2006-02-2006  . CHOLECYSTECTOMY N/A 11/11/2012   Procedure: LAPAROSCOPIC CHOLECYSTECTOMY WITH INTRAOPERATIVE CHOLANGIOGRAM;  Surgeon: Imogene Burn. Georgette Dover, MD;  Location: [facility];  Service: General;  Laterality: N/A;  . INSERT / REPLACE / REMOVE PACEMAKER  05-01-11   02-28-05-/05-04-10-ICD-MEDTRONIC MAXIMAL DR  . JOINT REPLACEMENT    . LAPAROSCOPIC CHOLECYSTECTOMY  11/11/2012  . LAPAROSCOPIC LYSIS OF ADHESIONS N/A 11/11/2012   Procedure: LAPAROSCOPIC LYSIS OF ADHESIONS;  Surgeon: Imogene Burn. Georgette Dover, MD;  Location: [facility];  Service: General;  Laterality: N/A;  . NEPHRECTOMY Right 06/2001    S/P RENAL CELL CANCER  . TOTAL HIP ARTHROPLASTY Right 05/03/2011   Procedure: TOTAL HIP ARTHROPLASTY ANTERIOR APPROACH;  Surgeon: Mcarthur Rossetti;  Location: [facility];  Service: Orthopedics;  Laterality: Right;  Removal of Cannulated Screws Right Hip, Right Direct Anterior Hip Replacement    Current Outpatient Prescriptions  Medication Sig Dispense Refill  . acetaminophen (TYLENOL) 500 MG tablet Take 1,000 mg by mouth every 6 (six) hours as needed for headache.    . carvedilol (COREG) 12.5 MG tablet Take 1 tablet (12.5 mg total) by mouth 2 (two) times daily with a meal. 60 tablet 10  . cetirizine (ZYRTEC) 10 MG tablet Take 10 mg by mouth daily.     . disopyramide (NORPACE) 150 MG capsule TAKE 1 CAPSULE BY MOUTH TWICE DAILY 60 capsule 6  . furosemide (LASIX) 40 MG tablet Take 1 tablet (40 mg total) by mouth 2 (two) times daily. 60  tablet 10  . rosuvastatin (CRESTOR) 40 MG tablet Take 1 tablet (40 mg total) by mouth daily. 90 tablet 3  . warfarin (COUMADIN) 2 MG tablet TAKE AS DIRECTED BY MOUTH ANTI-COAGULATION CLINIC 120 tablet 3  . sildenafil (REVATIO) 20 MG tablet Take 1 tablet (20 mg total) by mouth 3 (three) times daily. 90 tablet 6   No current facility-administered medications for this visit.      Allergies:   Codeine; Morphine and related; and Sulfonamide derivatives   Social History:  The patient  reports that she quit smoking about 14 years ago. Her smoking use included Cigarettes. She has a 20.00 pack-year smoking history. She has never used smokeless tobacco. She reports that she does not drink alcohol or use drugs.   Family History:   family history includes Breast cancer in her cousin; Breast cancer (age of onset: 81) in her maternal aunt; Heart failure in her mother.    Review of Systems: Review of Systems  Constitutional: Negative.   Respiratory: Positive for shortness of breath.   Cardiovascular: Positive for chest pain.  Gastrointestinal: Negative.   Musculoskeletal: Negative.   Neurological: Negative.   Psychiatric/Behavioral: Negative.   All other systems reviewed and are negative.    PHYSICAL EXAM: VS:  BP (!) 120/56 (BP Location: Left Arm, Patient Position: Sitting, Cuff Size: Normal)   Pulse 74   Ht 5\' 7"  (1.702 m)   Wt 193 lb 8 oz (87.8 kg)   BMI 30.31 kg/m  , BMI Body mass index is 30.31 kg/m. GEN: Well nourished, well developed, in no acute  distress,  HEENT: normal  Neck: no JVD, carotid bruits, or masses Cardiac: RRR; no murmurs, rubs, or gallops,no edema  Respiratory:  clear to auscultation bilaterally, normal work of breathing GI: soft, nontender, nondistended, + BS MS: no deformity or atrophy  Skin: warm and dry, no rash Neuro:  Strength and sensation are intact Psych: euthymic mood, full affect    Recent Labs: 03/06/2015: ALT 13; TSH 1.94 07/27/2015: BUN 14;  Creatinine, Ser 1.30; Potassium 4.9; Sodium 143    Lipid Panel Lab Results  Component Value Date   CHOL 203 (H) 03/06/2015   HDL 39.20 03/06/2015   LDLCALC 97 08/17/2014   TRIG 256.0 (H) 03/06/2015      Wt Readings from Last 3 Encounters:  12/04/15 193 lb 8 oz (87.8 kg)  11/21/15 190 lb 4 oz (86.3 kg)  09/21/15 191 lb 8 oz (86.9 kg)       ASSESSMENT AND PLAN:  Pulmonary hypertension (HCC) Markedly elevated right heart pressures on last 2 echocardiograms Recommended she start revatio 20 mg 3 times a day for symptom relief When she has chest tightness, suggested she take Lasix 80 mg twice a day for 3 days then back to 40 mg twice a day  Atrial fibrillation, unspecified type ([place]) - Plan: EKG 12-Lead Minimal events noted on pacer download No changes to her medications, tolerating anticoagulation  Carotid artery stenosis, bilateral - Plan: EKG 12-Lead 60-70% carotid disease Stop smoking many years ago, tolerating her statin though with muscle cramping  Shortness of breath - Plan: EKG 12-Lead Likely multifactorial including deconditioning, COPD, Most concerning is her pulmonary hypertension We will try revatio for pulmonary hypertension Could also consider albuterol or other inhalers for COPD  Hyperlipidemia Most recent cholesterol and of 2016 was above goal We'll wait for repeat lipid panel before further changes to her medications If still above goal, would add Zetia 10 mg daily  Chronic diastolic CHF (congestive heart failure) (HCC) On chronic Lasix 40 twice a day, occasionally up to 80  Centrilobular emphysema (HCC) Chronic shortness of breath, Not on any inhalers She may benefit from albuterol inhaler for chest tightness  Chest pain Suspect musculoskeletal in nature, unclear if this is exacerbated by her Crestor Recommended she call us if she has further episodes of chest discomfort  ICD: Pacer/defibrillator download on today's visit, we have contacted  [facility] today and they are aware of her low battery   Total encounter time more than 40 minutes  Greater than 50% was spent in counseling and coordination of care with the patient   Disposition:   F/U  3 months   Orders Placed This Encounter  Procedures  . EKG 12-Lead     Signed, Esmond Plants, M.D., Ph.D. 12/04/2015  [place], [place]

## 2015-12-04 NOTE — Telephone Encounter (Signed)
When pt was checking in at front desk for coumadin ck; pt had hard and sharp chest pain; pain level 9. CP did not radiate anywhere. Pt sat in lab chair and rested about 4-5 mins and pain went away. Pt has pacemaker and saw Dr Caryl Comes in June 2017. Now no CP, no SOB that is more than usual; pt does not appear in any distress; BP 140/72 lge cuff lt arm, P 65 and pulse ox 97%. Spoke with Dr Deborra Medina and she advised pt should contact cardiologist. I offered to call Dr Caryl Comes or Dr Rockey Situ for pt and pt said she only lives short distance from here and will call cardiologist when gets home. Pt said she is OK to drive.

## 2015-12-05 ENCOUNTER — Ambulatory Visit (INDEPENDENT_AMBULATORY_CARE_PROVIDER_SITE_OTHER): Payer: PPO | Admitting: *Deleted

## 2015-12-05 DIAGNOSIS — E538 Deficiency of other specified B group vitamins: Secondary | ICD-10-CM | POA: Diagnosis not present

## 2015-12-05 MED ORDER — CYANOCOBALAMIN 1000 MCG/ML IJ SOLN
1000.0000 ug | Freq: Once | INTRAMUSCULAR | Status: AC
  Administered 2015-12-05: 1000 ug via INTRAMUSCULAR

## 2015-12-06 NOTE — Telephone Encounter (Signed)
Prior Authorization is in process for the Revatio with Dx: Pulmonary Arterial HTN I27.2.

## 2015-12-12 NOTE — Telephone Encounter (Signed)
Notified the patient Sildenafil 20 mg has been approved from 12/11/2015 until 05/05/2016.  [place] has been notified of the approval also.

## 2015-12-20 ENCOUNTER — Encounter: Payer: Self-pay | Admitting: Cardiovascular Disease

## 2015-12-26 ENCOUNTER — Other Ambulatory Visit
Admission: RE | Admit: 2015-12-26 | Discharge: 2015-12-26 | Disposition: A | Discharge status: Home / Self Care | Payer: PPO | Source: Ambulatory Visit | Attending: Internal Medicine | Admitting: Internal Medicine

## 2015-12-26 ENCOUNTER — Ambulatory Visit (INDEPENDENT_AMBULATORY_CARE_PROVIDER_SITE_OTHER): Payer: PPO | Admitting: Internal Medicine

## 2015-12-26 ENCOUNTER — Encounter: Payer: Self-pay | Admitting: Internal Medicine

## 2015-12-26 VITALS — BP 130/70 | HR 70 | Ht 67.0 in | Wt 190.1 lb

## 2015-12-26 DIAGNOSIS — I4891 Unspecified atrial fibrillation: Secondary | ICD-10-CM

## 2015-12-26 DIAGNOSIS — Z79899 Other long term (current) drug therapy: Secondary | ICD-10-CM

## 2015-12-26 DIAGNOSIS — I428 Other cardiomyopathies: Secondary | ICD-10-CM

## 2015-12-26 DIAGNOSIS — Z95810 Presence of automatic (implantable) cardiac defibrillator: Secondary | ICD-10-CM | POA: Diagnosis not present

## 2015-12-26 DIAGNOSIS — I495 Sick sinus syndrome: Secondary | ICD-10-CM

## 2015-12-26 DIAGNOSIS — I429 Cardiomyopathy, unspecified: Secondary | ICD-10-CM | POA: Insufficient documentation

## 2015-12-26 DIAGNOSIS — I5042 Chronic combined systolic (congestive) and diastolic (congestive) heart failure: Secondary | ICD-10-CM

## 2015-12-26 LAB — CUP PACEART INCLINIC DEVICE CHECK
Battery Voltage: 2.62 V
Brady Statistic AP VP Percent: 99.52 %
Brady Statistic AP VS Percent: 0 %
Brady Statistic AS VP Percent: 0.48 %
Brady Statistic AS VS Percent: 0 %
Brady Statistic RA Percent Paced: 99.52 %
Brady Statistic RV Percent Paced: 100 %
Date Time Interrogation Session: 20170822144847
HighPow Impedance: 399 Ohm
HighPow Impedance: 43 Ohm
HighPow Impedance: 53 Ohm
Implantable Lead Implant Date: 20061026
Implantable Lead Implant Date: 20111230
Implantable Lead Location: 753859
Implantable Lead Location: 753860
Implantable Lead Model: 5076
Implantable Lead Model: 6947
Lead Channel Impedance Value: 475 Ohm
Lead Channel Impedance Value: 475 Ohm
Lead Channel Pacing Threshold Amplitude: 0.875 V
Lead Channel Pacing Threshold Amplitude: 1.625 V
Lead Channel Pacing Threshold Pulse Width: 0.4 ms
Lead Channel Pacing Threshold Pulse Width: 0.4 ms
Lead Channel Sensing Intrinsic Amplitude: 0.5 mV
Lead Channel Sensing Intrinsic Amplitude: 12.75 mV
Lead Channel Setting Pacing Amplitude: 2.5 V
Lead Channel Setting Pacing Amplitude: 2.5 V
Lead Channel Setting Pacing Pulse Width: 0.4 ms
Lead Channel Setting Sensing Sensitivity: 0.3 mV

## 2015-12-26 LAB — BASIC METABOLIC PANEL
Anion gap: 8 (ref 5–15)
BUN: 17 mg/dL (ref 6–20)
CO2: 30 mmol/L (ref 22–32)
Calcium: 9.3 mg/dL (ref 8.9–10.3)
Chloride: 101 mmol/L (ref 101–111)
Creatinine, Ser: 1.54 mg/dL — ABNORMAL HIGH (ref 0.44–1.00)
GFR calc Af Amer: 36 mL/min — ABNORMAL LOW (ref >60)
GFR calc non Af Amer: 31 mL/min — ABNORMAL LOW (ref >60)
Glucose, Bld: 87 mg/dL (ref 65–99)
Potassium: 4 mmol/L (ref 3.5–5.1)
Sodium: 139 mmol/L (ref 135–145)

## 2015-12-26 NOTE — Progress Notes (Signed)
Patient Care Team: Lucille Passy, MD as PCP - General Minna Merritts, MD as Consulting Physician (Cardiology) Deboraha Sprang, MD as Consulting Physician (Cardiology) Katha Cabal, MD as Consulting Physician (Vascular Surgery)   HPI  [name] is a 82 y.o. female Seen in followup for nonischemic cardiomyopathy for which she has a primary prevention ICD implanted initially 2006 with generator replacement about 5 years ago.  When seen a month ago, there was tenderness raising the question of infection   She is seen today to look at the wound  Her pocket has been looked at a couple of times over the last 2-1/2 months and it is been increasingly less tender.  This continues to improve \ She has paroxysmal atrial fibrillation; previously this was treated with disopyramide but was switched earlier this year to flecainide. It was however complicated by side effects and she went back on disopyramide. ( the d/c summary 4/16 was WRONG-describing the use of amiodarone)  She's had infrequent Afib  Echocardiogram 2015 demonstrated normal LV function moderate-severe MR moderate TR.  Echo 4/16 was reviewed and was similar with RV enlargement and PA pressure of 75-mod severe pulm HTN  Cath 2006 without obstructive disease  She has a complicated medical history in addition which includes renal cell carcinoma with prior resection, chronic renal insufficiency and COPD.  She has chronic stable SOB without edema but 2 pillow orthopnea     3/17 Creatinine was 1.3 K 4.9    Past Medical History:  Diagnosis Date  . Adjustment disorder with anxiety   . Anginal pain ([place])   . Arthritis    "some in my hands" (11/11/2012)  . Atrial fibrillation ([place])   . Automatic implantable cardioverter-defibrillator in situ   . Blood in stool   . Carotid artery stenosis 09/2007   60-79% bilateral (stable)  . Carotid artery stenosis 10/2008   40-59% R 60-79%   . Chest pain, unspecified   . Chronic  airway obstruction, not elsewhere classified   . Chronic bronchitis ([place])    "get it some; not q year" (11/11/2012)  . Chronic diastolic CHF (congestive heart failure) ([place]) 06/04/2013  . Chronic kidney disease, unspecified ([place])   . Coronary artery disease    non-obstructive by 2006 cath  . Dysuria   . Edema   . Encounter for therapeutic drug monitoring   . Exertional shortness of breath   . Frequent UTI    "get them a couple times/yr" (11/11/2012)  . Hemorrhage of rectum and anus   . High cholesterol   . History of blood transfusion 04/2011   "after hip OR" (11/11/2012)  . Hypertension 05/20/2011  . Hypertrophic obstructive cardiomyopathy   . Hypotension, unspecified    cardiac cath 2006..nonobstructive CAD 30-40s lesions.Marland KitchenETT 1/09 nondiagnostic due to poor HR response..Right Renal Cancer 2003  . Long term (current) use of anticoagulants   . Malignant neoplasm of kidney, except pelvis   . Osteoarthritis of right hip   . Other acute sinusitis   . Other and unspecified coagulation defects   . Other malaise and fatigue   . Other screening mammogram   . PONV (postoperative nausea and vomiting)   . Pre-operative cardiovascular examination   . Renal cancer ([place]) 06/2001   Right  . Secondary cardiomyopathy, unspecified   . Sinus bradycardia   . Special screening for osteoporosis   . Urge incontinence     Past Surgical History:  Procedure Laterality Date  .  ABDOMINAL HYSTERECTOMY  1975   for benign causes  . APPENDECTOMY    . BI-VENTRICULAR PACEMAKER UPGRADE  05/04/2010  . CARDIAC CATHETERIZATION  2006  . CATARACT EXTRACTION W/ INTRAOCULAR LENS  IMPLANT, BILATERAL  01/2006-02-2006  . CHOLECYSTECTOMY N/A 11/11/2012   Procedure: LAPAROSCOPIC CHOLECYSTECTOMY WITH INTRAOPERATIVE CHOLANGIOGRAM;  Surgeon: Imogene Burn. Georgette Dover, MD;  Location: [facility];  Service: General;  Laterality: N/A;  . INSERT / REPLACE / REMOVE PACEMAKER  05-01-11   02-28-05-/05-04-10-ICD-MEDTRONIC MAXIMAL DR  . JOINT  REPLACEMENT    . LAPAROSCOPIC CHOLECYSTECTOMY  11/11/2012  . LAPAROSCOPIC LYSIS OF ADHESIONS N/A 11/11/2012   Procedure: LAPAROSCOPIC LYSIS OF ADHESIONS;  Surgeon: Imogene Burn. Georgette Dover, MD;  Location: [facility];  Service: General;  Laterality: N/A;  . NEPHRECTOMY Right 06/2001    S/P RENAL CELL CANCER  . TOTAL HIP ARTHROPLASTY Right 05/03/2011   Procedure: TOTAL HIP ARTHROPLASTY ANTERIOR APPROACH;  Surgeon: Mcarthur Rossetti;  [facility];  Service: Orthopedics;  Laterality: Right;  Removal of Cannulated Screws Right Hip, Right Direct Anterior Hip Replacement    Current Outpatient Prescriptions  Medication Sig Dispense Refill  . acetaminophen (TYLENOL) 500 MG tablet Take 1,000 mg by mouth every 6 (six) hours as needed for headache.    . carvedilol (COREG) 12.5 MG tablet Take 1 tablet (12.5 mg total) by mouth 2 (two) times daily with a meal. 60 tablet 10  . cetirizine (ZYRTEC) 10 MG tablet Take 10 mg by mouth daily.     . disopyramide (NORPACE) 150 MG capsule TAKE 1 CAPSULE BY MOUTH TWICE DAILY 60 capsule 6  . furosemide (LASIX) 40 MG tablet Take 1 tablet (40 mg total) by mouth 2 (two) times daily. 60 tablet 10  . rosuvastatin (CRESTOR) 40 MG tablet Take 1 tablet (40 mg total) by mouth daily. 90 tablet 3  . sildenafil (REVATIO) 20 MG tablet Take 1 tablet (20 mg total) by mouth 3 (three) times daily. 90 tablet 6  . warfarin (COUMADIN) 2 MG tablet TAKE AS DIRECTED BY MOUTH ANTI-COAGULATION CLINIC 120 tablet 3   No current facility-administered medications for this visit.     Allergies  Allergen Reactions  . Codeine Nausea And Vomiting  . Morphine And Related Nausea And Vomiting  . Sulfonamide Derivatives Other (See Comments)    REACTION: Dry mouth    Review of Systems negative except from HPI and PMH  Physical Exam BP 130/70 (BP Location: Right Arm, Patient Position: Sitting, Cuff Size: Large)   Pulse 70   Ht 5\' 7"  (1.702 m)   Wt 190 lb 1.9 oz (86.2 kg)   SpO2 95%   BMI 29.78 kg/m    Well developed and well nourished in no acute distress HENT normal E scleral and icterus clear Neck Supple JVP flat; carotids brisk and full Device pocket well healed; without hematoma   There is no tethering   no erythema in the incision line of her device generator replacement Clear to ausculation   Regular rate and rhythm, no murmurs gallops or rub Soft with active bowel sounds No clubbing cyanosis  Edema Alert and oriented, grossly normal motor and sensory function Skin Warm and Dry  ECG demonstrates  AV pacing  Assessment and  Plan  Nonischemic cardiomyopathy with interval normalization of LV function  Sinus node dysfunction  Complete heart block  Device dependent   Atrial fibrillation   High risk medication surveillance-disopyramide  Device related chest pain   Renal insufficiency GFR  grade 3  10/16  Implantable defibrillator  Her pocket is better.   Her device is approaching ERI. I have demonstrated tones again  Infrequent Afib  Continue disopyramide--will check levels and Bmet  I fear that it generator replacement we will find that the device has a low-grade infection. Hopefully I'm wrong.

## 2015-12-26 NOTE — Patient Instructions (Addendum)
Medication Instructions: - Your physician recommends that you continue on your current medications as directed. Please refer to the Current Medication list given to you today.  Labwork: - Your physician recommends that you have lab work today: BMP/ disopyramide  Procedures/Testing: - none  Follow-Up: - Your physician recommends that you schedule a follow-up appointment in: October with Dr. Caryl Comes.  Any Additional Special Instructions Will Be Listed Below (If Applicable).     If you need a refill on your cardiac medications before your next appointment, please call your pharmacy.

## 2015-12-28 LAB — MISC LABCORP TEST (SEND OUT): Labcorp test code: 7864

## 2016-01-04 ENCOUNTER — Other Ambulatory Visit: Payer: PPO

## 2016-01-05 ENCOUNTER — Other Ambulatory Visit: Payer: Self-pay | Admitting: Internal Medicine

## 2016-01-10 ENCOUNTER — Ambulatory Visit (INDEPENDENT_AMBULATORY_CARE_PROVIDER_SITE_OTHER): Payer: PPO | Admitting: Internal Medicine

## 2016-01-10 ENCOUNTER — Other Ambulatory Visit (INDEPENDENT_AMBULATORY_CARE_PROVIDER_SITE_OTHER): Payer: PPO

## 2016-01-10 DIAGNOSIS — Z7901 Long term (current) use of anticoagulants: Secondary | ICD-10-CM | POA: Diagnosis not present

## 2016-01-10 DIAGNOSIS — E538 Deficiency of other specified B group vitamins: Secondary | ICD-10-CM

## 2016-01-10 DIAGNOSIS — Z5181 Encounter for therapeutic drug level monitoring: Secondary | ICD-10-CM | POA: Diagnosis not present

## 2016-01-10 DIAGNOSIS — I4891 Unspecified atrial fibrillation: Secondary | ICD-10-CM

## 2016-01-10 LAB — POCT INR: INR: 2.4

## 2016-01-10 MED ORDER — CYANOCOBALAMIN 1000 MCG/ML IJ SOLN
1000.0000 ug | Freq: Once | INTRAMUSCULAR | Status: AC
  Administered 2016-01-10: 1000 ug via INTRAMUSCULAR

## 2016-01-10 NOTE — Progress Notes (Signed)
Monthly b12 inj ordered by Dr. Deborra Medina per 01/20/14 results phone note. Last b12 labs 04/27/15

## 2016-01-11 NOTE — Progress Notes (Signed)
B12 injection given at nurse visit, chart reviewed.

## 2016-01-15 ENCOUNTER — Encounter: Payer: Self-pay | Admitting: Internal Medicine

## 2016-01-16 ENCOUNTER — Telehealth: Payer: Self-pay | Admitting: Internal Medicine

## 2016-01-16 NOTE — Telephone Encounter (Signed)
Pt states her pacemaker did beep yesterday morning and this morning. Please call.

## 2016-01-16 NOTE — Telephone Encounter (Signed)
Spoke w/ pt and had her send a remote transmission w/ her home monitor. Transmission received. Pt device did reach ERI on 01-15-16. Pt just seen MD on 12-26-15. Informed pt that I would send note to MD and MD nurse as I am not sure if he will want to see her in the office again or not since he just seen her about 3 weeks ago. Informed pt that MD is not back in the office until next week (01-23-16) and that MD nurse is out of the office until Thursday and that  I am not sure when she will receive a call. Pt verbalized understanding and said to try her home number 1st and if she isn't available at that number to try her cellular number. Pt numbers verified with what we have on file and both numbers are correct.

## 2016-01-16 NOTE — Telephone Encounter (Signed)
Called pt and let her know that her device would alarm every morning until she came into th office to have the alarm turned off. Pt stated that the alarm was not bothering her and she is ok to wait until 10/3 apt with SK/

## 2016-01-22 NOTE — Telephone Encounter (Signed)
noted 

## 2016-01-25 ENCOUNTER — Ambulatory Visit (INDEPENDENT_AMBULATORY_CARE_PROVIDER_SITE_OTHER): Payer: PPO | Admitting: Internal Medicine

## 2016-01-25 ENCOUNTER — Encounter: Payer: Self-pay | Admitting: Internal Medicine

## 2016-01-25 VITALS — BP 120/60 | HR 63 | Ht 66.0 in | Wt 192.6 lb

## 2016-01-25 DIAGNOSIS — I428 Other cardiomyopathies: Secondary | ICD-10-CM

## 2016-01-25 DIAGNOSIS — I495 Sick sinus syndrome: Secondary | ICD-10-CM

## 2016-01-25 DIAGNOSIS — I5042 Chronic combined systolic (congestive) and diastolic (congestive) heart failure: Secondary | ICD-10-CM | POA: Diagnosis not present

## 2016-01-25 DIAGNOSIS — I429 Cardiomyopathy, unspecified: Secondary | ICD-10-CM | POA: Diagnosis not present

## 2016-01-25 DIAGNOSIS — Z95810 Presence of automatic (implantable) cardiac defibrillator: Secondary | ICD-10-CM

## 2016-01-25 DIAGNOSIS — I4891 Unspecified atrial fibrillation: Secondary | ICD-10-CM

## 2016-01-25 NOTE — Progress Notes (Addendum)
Patient Care Team: Lucille Passy, MD as PCP - General Minna Merritts, MD as Consulting Physician (Cardiology) Deboraha Sprang, MD as Consulting Physician (Cardiology) Katha Cabal, MD as Consulting Physician (Vascular Surgery)   HPI  [name] is a 82 y.o. female Seen in followup for nonischemic cardiomyopathy for which she has a primary prevention ICD implanted initially 2006 with generator replacement about 5 years ago.   There have been concerns over recent months about her pocket which has been tender and swollen. It has been progressively less of a problem over months   Her device started beating about 10 days ago. This marks ERI.  She has paroxysmal atrial fibrillation; previously this was treated with disopyramide but was switched earlier this year to flecainide. It was however complicated by side effects and she went back on disopyramide. ( the d/c summary 4/16 was WRONG-describing the use of amiodarone)  She's had infrequent Afib  She has a CHADS-VASc score of greater than or equal to 6 (age-55, gender-1, TIA-2 (4/16), heart failure-1)  Echocardiogram 2015 demonstrated normal LV function moderate-severe MR moderate TR.  Echo 4/16 was reviewed and was similar with RV enlargement and PA pressure of 75-mod severe pulm HTN  Cath 2006 without obstructive disease  She has a complicated medical history in addition which includes renal cell carcinoma with prior resection, chronic renal insufficiency and COPD.  She has chronic stable SOB without edema but 2 pillow orthopnea     3/17 Creatinine was 1.3 K 4.9    Past Medical History:  Diagnosis Date  . Adjustment disorder with anxiety   . Anginal pain ([place])   . Arthritis    "some in my hands" (11/11/2012)  . Atrial fibrillation ([place])   . Automatic implantable cardioverter-defibrillator in situ   . Blood in stool   . Carotid artery stenosis 09/2007   60-79% bilateral (stable)  . Carotid artery stenosis 10/2008     40-59% R 60-79%   . Chest pain, unspecified   . Chronic airway obstruction, not elsewhere classified   . Chronic bronchitis ([place])    "get it some; not q year" (11/11/2012)  . Chronic diastolic CHF (congestive heart failure) ([place]) 06/04/2013  . Chronic kidney disease, unspecified ([place])   . Coronary artery disease    non-obstructive by 2006 cath  . Dysuria   . Edema   . Encounter for therapeutic drug monitoring   . Exertional shortness of breath   . Frequent UTI    "get them a couple times/yr" (11/11/2012)  . Hemorrhage of rectum and anus   . High cholesterol   . History of blood transfusion 04/2011   "after hip OR" (11/11/2012)  . Hypertension 05/20/2011  . Hypertrophic obstructive cardiomyopathy   . Hypotension, unspecified    cardiac cath 2006..nonobstructive CAD 30-40s lesions.Marland KitchenETT 1/09 nondiagnostic due to poor HR response..Right Renal Cancer 2003  . Long term (current) use of anticoagulants   . Malignant neoplasm of kidney, except pelvis   . Osteoarthritis of right hip   . Other acute sinusitis   . Other and unspecified coagulation defects   . Other malaise and fatigue   . Other screening mammogram   . PONV (postoperative nausea and vomiting)   . Pre-operative cardiovascular examination   . Renal cancer ([place]) 06/2001   Right  . Secondary cardiomyopathy, unspecified   . Sinus bradycardia   . Special screening for osteoporosis   . Urge incontinence     Past  Surgical History:  Procedure Laterality Date  . ABDOMINAL HYSTERECTOMY  1975   for benign causes  . APPENDECTOMY    . BI-VENTRICULAR PACEMAKER UPGRADE  05/04/2010  . CARDIAC CATHETERIZATION  2006  . CATARACT EXTRACTION W/ INTRAOCULAR LENS  IMPLANT, BILATERAL  01/2006-02-2006  . CHOLECYSTECTOMY N/A 11/11/2012   Procedure: LAPAROSCOPIC CHOLECYSTECTOMY WITH INTRAOPERATIVE CHOLANGIOGRAM;  Surgeon: Imogene Burn. Georgette Dover, MD;  Location: [facility];  Service: General;  Laterality: N/A;  . INSERT / REPLACE / REMOVE PACEMAKER  05-01-11    02-28-05-/05-04-10-ICD-MEDTRONIC MAXIMAL DR  . JOINT REPLACEMENT    . LAPAROSCOPIC CHOLECYSTECTOMY  11/11/2012  . LAPAROSCOPIC LYSIS OF ADHESIONS N/A 11/11/2012   Procedure: LAPAROSCOPIC LYSIS OF ADHESIONS;  Surgeon: Imogene Burn. Georgette Dover, MD;  Location: [facility];  Service: General;  Laterality: N/A;  . NEPHRECTOMY Right 06/2001    S/P RENAL CELL CANCER  . TOTAL HIP ARTHROPLASTY Right 05/03/2011   Procedure: TOTAL HIP ARTHROPLASTY ANTERIOR APPROACH;  Surgeon: Mcarthur Rossetti;  Location: [facility];  Service: Orthopedics;  Laterality: Right;  Removal of Cannulated Screws Right Hip, Right Direct Anterior Hip Replacement    Current Outpatient Prescriptions  Medication Sig Dispense Refill  . acetaminophen (TYLENOL) 500 MG tablet Take 1,000 mg by mouth every 6 (six) hours as needed for headache.    . carvedilol (COREG) 12.5 MG tablet Take 1 tablet (12.5 mg total) by mouth 2 (two) times daily with a meal. 60 tablet 10  . cetirizine (ZYRTEC) 10 MG tablet Take 10 mg by mouth daily.     . disopyramide (NORPACE) 150 MG capsule TAKE 1 CAPSULE BY MOUTH TWICE DAILY 60 capsule 6  . furosemide (LASIX) 40 MG tablet Take 1 tablet (40 mg total) by mouth 2 (two) times daily. 60 tablet 10  . rosuvastatin (CRESTOR) 40 MG tablet Take 1 tablet (40 mg total) by mouth daily. 90 tablet 3  . sildenafil (REVATIO) 20 MG tablet Take 1 tablet (20 mg total) by mouth 3 (three) times daily. 90 tablet 6  . warfarin (COUMADIN) 2 MG tablet TAKE AS DIRECTED BY MOUTH ANTI-COAGULATION CLINIC 120 tablet 3   No current facility-administered medications for this visit.     Allergies  Allergen Reactions  . Codeine Nausea And Vomiting  . Morphine And Related Nausea And Vomiting  . Sulfonamide Derivatives Other (See Comments)    REACTION: Dry mouth    Review of Systems negative except from HPI and PMH  Physical Exam BP 120/60   Pulse 63   Ht 5\' 6"  (1.676 m)   Wt 192 lb 9.6 oz (87.4 kg)   SpO2 98%   BMI 31.09 kg/m  Well developed  and well nourished in no acute distress HENT normal E scleral and icterus clear Neck Supple JVP flat; carotids brisk and full Device pocket well healed; without hematoma   There is no tethering   no erythema in the incision line of her device generator replacement Clear to ausculation   Regular rate and rhythm, no murmurs gallops or rub Soft with active bowel sounds No clubbing cyanosis  Edema Alert and oriented, grossly normal motor and sensory function Skin Warm and Dry  ECG demonstrates  AV pacing  Assessment and  Plan  Nonischemic cardiomyopathy with interval normalization of LV function  Sinus node dysfunction device dependent   Complete heart block intermittent  Atrial fibrillation   High risk medication surveillance-disopyramide  Device related chest pain   Renal insufficiency GFR  grade 3  10/16  Implantable defibrillator   Her  pocket is better.   Her device has reached ERI.    I am less impressed that infection will be found. The plan will be to open up the pocket and if it appears infected to close it and refer her for extraction. In the event that it looks okay, we will culture it reimplanted and using a antimicrobial pouch.  We will have a discussion at that point regarding high-voltage replacement or not  Infrequent Afib  Continue disopyramide--will check levels and Bmet

## 2016-01-25 NOTE — Patient Instructions (Signed)
Medication Instructions:  Your physician recommends that you continue on your current medications as directed. Please refer to the Current Medication list given to you today.   Labwork: Your physician recommends that you return for lab work on 02/14/16 in [place] at [place] not have to fast.Need a BMP/CBC/INR   Testing/Procedures:  ICD generator change on 02/21/16 at 11:30am   Please arrive at the [place] [facility] at 9:30am on the day of your procedure Do not eat or drink after midnight the night before your procedure Do not take any medications the morning of your procedure HOLD Coumadin for 3 days prior to the procedure Plan for one night stay     Follow-Up:  Your physician recommends that you schedule a follow-up appointment in: 10-14 days from 02/21/16 in device clinic for wound check and 3 months from 02/21/16 with Dr Caryl Comes   Any Other Special Instructions Will Be Listed Below (If Applicable).     If you need a refill on your cardiac medications before your next appointment, please call your pharmacy.

## 2016-02-06 ENCOUNTER — Encounter: Payer: PPO | Admitting: Internal Medicine

## 2016-02-12 ENCOUNTER — Other Ambulatory Visit (INDEPENDENT_AMBULATORY_CARE_PROVIDER_SITE_OTHER): Payer: PPO

## 2016-02-12 ENCOUNTER — Ambulatory Visit (INDEPENDENT_AMBULATORY_CARE_PROVIDER_SITE_OTHER): Payer: PPO

## 2016-02-12 DIAGNOSIS — Z5181 Encounter for therapeutic drug level monitoring: Secondary | ICD-10-CM | POA: Diagnosis not present

## 2016-02-12 DIAGNOSIS — I4891 Unspecified atrial fibrillation: Secondary | ICD-10-CM | POA: Diagnosis not present

## 2016-02-12 DIAGNOSIS — Z23 Encounter for immunization: Secondary | ICD-10-CM | POA: Diagnosis not present

## 2016-02-12 DIAGNOSIS — E538 Deficiency of other specified B group vitamins: Secondary | ICD-10-CM

## 2016-02-12 DIAGNOSIS — Z7901 Long term (current) use of anticoagulants: Secondary | ICD-10-CM

## 2016-02-12 LAB — BASIC METABOLIC PANEL
BUN: 20 mg/dL (ref 6–23)
CO2: 29 mEq/L (ref 19–32)
Calcium: 9.1 mg/dL (ref 8.4–10.5)
Chloride: 102 mEq/L (ref 96–112)
Creatinine, Ser: 1.7 mg/dL — ABNORMAL HIGH (ref 0.40–1.20)
GFR: 30.74 mL/min — ABNORMAL LOW (ref >60.00)
Glucose, Bld: 95 mg/dL (ref 70–99)
Potassium: 3.8 mEq/L (ref 3.5–5.1)
Sodium: 139 mEq/L (ref 135–145)

## 2016-02-12 LAB — CBC WITH DIFFERENTIAL/PLATELET
Basophils Absolute: 0 10*3/uL (ref 0.0–0.1)
Basophils Relative: 0.3 % (ref 0.0–3.0)
Eosinophils Absolute: 0.1 10*3/uL (ref 0.0–0.7)
Eosinophils Relative: 1.5 % (ref 0.0–5.0)
HCT: 31.2 % — ABNORMAL LOW (ref 36.0–46.0)
Hemoglobin: 10.1 g/dL — ABNORMAL LOW (ref 12.0–15.0)
Lymphocytes Relative: 27.9 % (ref 12.0–46.0)
Lymphs Abs: 1.2 10*3/uL (ref 0.7–4.0)
MCHC: 32.5 g/dL (ref 30.0–36.0)
MCV: 80.6 fl (ref 78.0–100.0)
Monocytes Absolute: 0.6 10*3/uL (ref 0.1–1.0)
Monocytes Relative: 14.2 % — ABNORMAL HIGH (ref 3.0–12.0)
Neutro Abs: 2.3 10*3/uL (ref 1.4–7.7)
Neutrophils Relative %: 56.1 % (ref 43.0–77.0)
Platelets: 174 10*3/uL (ref 150.0–400.0)
RBC: 3.87 Mil/uL (ref 3.87–5.11)
RDW: 17.8 % — ABNORMAL HIGH (ref 11.5–15.5)
WBC: 4.1 10*3/uL (ref 4.0–10.5)

## 2016-02-12 LAB — VITAMIN B12: Vitamin B-12: 500 pg/mL (ref 211–911)

## 2016-02-12 LAB — POCT INR: INR: 3.4

## 2016-02-12 MED ORDER — CYANOCOBALAMIN 1000 MCG/ML IJ SOLN
1000.0000 ug | Freq: Once | INTRAMUSCULAR | Status: AC
  Administered 2016-02-12: 1000 ug via INTRAMUSCULAR

## 2016-02-14 ENCOUNTER — Telehealth: Payer: Self-pay | Admitting: Internal Medicine

## 2016-02-14 NOTE — Telephone Encounter (Signed)
Pt calling stating that she get her INR checked on Monday and she is wanting to make sure if the what they told her to do is right. She stated her blood was a bit thinner Please see in epic  Call patient back and confirm instructions on when to stop her coumadin

## 2016-02-14 NOTE — Telephone Encounter (Signed)
Patient called in and was confused about her coumadin instructions. She states that she had her PT/INR checked on 02/12/16 and it was high at 3.4 and they instructed her to change her dosage. Reviewed their instructions with her and she requested that I repeat it a few times. Instructed her per Dr. Rip Harbour instructions she is to take 2 mg today, 3 mg tomorrow, 2 mg on Friday 13th, and then 3 mg on Saturday 14th and none after that as directed by Dr. Caryl Comes for pacemaker placement on 02/21/16. Once pacemaker is placed Dr. Caryl Comes will instruct her on when to resume. Dr. Marjory Lies did mention for her to have PT/INR rechecked in one week which would be Monday 02/19/16 and she needs to call and schedule that to be done. Let her know that I would send this to Dr. Caryl Comes for his review, and Margarite Gouge CMA as well. She was very appreciative for me guiding her with very specific instructions. She verbalized understanding, repeated instructions, and had no further questions. Let her know to please call back if she needed anything.

## 2016-02-14 NOTE — Telephone Encounter (Signed)
Let's have her revise her Coumadin recommendations and have her take 2 mg daily through Sunday with a recheck on Monday.

## 2016-02-15 NOTE — Telephone Encounter (Signed)
I called and spoke with the patient.  She is aware of Dr. Olin Pia recommendations to take warfarin 2 mg daily now through Sunday with a recheck INR on Monday. Will ask that [facility] office call us Monday with INR results so we are able to instruct patient on warfarin prior to her generator change out on 02/21/16.

## 2016-02-18 ENCOUNTER — Other Ambulatory Visit: Payer: Self-pay | Admitting: Internal Medicine

## 2016-02-18 DIAGNOSIS — Z959 Presence of cardiac and vascular implant and graft, unspecified: Secondary | ICD-10-CM

## 2016-02-19 ENCOUNTER — Other Ambulatory Visit (INDEPENDENT_AMBULATORY_CARE_PROVIDER_SITE_OTHER): Payer: PPO

## 2016-02-19 DIAGNOSIS — Z7901 Long term (current) use of anticoagulants: Secondary | ICD-10-CM

## 2016-02-19 DIAGNOSIS — Z5181 Encounter for therapeutic drug level monitoring: Secondary | ICD-10-CM | POA: Diagnosis not present

## 2016-02-19 DIAGNOSIS — I4891 Unspecified atrial fibrillation: Secondary | ICD-10-CM

## 2016-02-19 LAB — POCT INR: INR: 2.2

## 2016-02-21 ENCOUNTER — Encounter (HOSPITAL_COMMUNITY)
Admission: RE | Disposition: A | Discharge status: Home / Self Care | Payer: Self-pay | Source: Ambulatory Visit | Attending: Internal Medicine

## 2016-02-21 ENCOUNTER — Ambulatory Visit (HOSPITAL_COMMUNITY): Admission: RE | Admit: 2016-02-21 | Payer: PPO | Source: Ambulatory Visit | Admitting: Internal Medicine

## 2016-02-21 ENCOUNTER — Ambulatory Visit (HOSPITAL_COMMUNITY)
Admission: RE | Admit: 2016-02-21 | Discharge: 2016-02-21 | Disposition: A | Discharge status: Home / Self Care | Payer: PPO | Source: Ambulatory Visit | Attending: Internal Medicine | Admitting: Internal Medicine

## 2016-02-21 DIAGNOSIS — J449 Chronic obstructive pulmonary disease, unspecified: Secondary | ICD-10-CM | POA: Diagnosis not present

## 2016-02-21 DIAGNOSIS — Z85528 Personal history of other malignant neoplasm of kidney: Secondary | ICD-10-CM | POA: Diagnosis not present

## 2016-02-21 DIAGNOSIS — Z7901 Long term (current) use of anticoagulants: Secondary | ICD-10-CM | POA: Insufficient documentation

## 2016-02-21 DIAGNOSIS — Z4502 Encounter for adjustment and management of automatic implantable cardiac defibrillator: Secondary | ICD-10-CM | POA: Insufficient documentation

## 2016-02-21 DIAGNOSIS — Z885 Allergy status to narcotic agent status: Secondary | ICD-10-CM | POA: Insufficient documentation

## 2016-02-21 DIAGNOSIS — Z96641 Presence of right artificial hip joint: Secondary | ICD-10-CM | POA: Insufficient documentation

## 2016-02-21 DIAGNOSIS — I130 Hypertensive heart and chronic kidney disease with heart failure and stage 1 through stage 4 chronic kidney disease, or unspecified chronic kidney disease: Secondary | ICD-10-CM | POA: Insufficient documentation

## 2016-02-21 DIAGNOSIS — Z882 Allergy status to sulfonamides status: Secondary | ICD-10-CM | POA: Diagnosis not present

## 2016-02-21 DIAGNOSIS — E7800 Pure hypercholesterolemia, unspecified: Secondary | ICD-10-CM | POA: Diagnosis not present

## 2016-02-21 DIAGNOSIS — N189 Chronic kidney disease, unspecified: Secondary | ICD-10-CM | POA: Diagnosis not present

## 2016-02-21 DIAGNOSIS — I428 Other cardiomyopathies: Secondary | ICD-10-CM | POA: Diagnosis not present

## 2016-02-21 DIAGNOSIS — F4322 Adjustment disorder with anxiety: Secondary | ICD-10-CM | POA: Insufficient documentation

## 2016-02-21 DIAGNOSIS — Z959 Presence of cardiac and vascular implant and graft, unspecified: Secondary | ICD-10-CM

## 2016-02-21 DIAGNOSIS — I495 Sick sinus syndrome: Secondary | ICD-10-CM | POA: Diagnosis not present

## 2016-02-21 DIAGNOSIS — I2510 Atherosclerotic heart disease of native coronary artery without angina pectoris: Secondary | ICD-10-CM | POA: Insufficient documentation

## 2016-02-21 DIAGNOSIS — I442 Atrioventricular block, complete: Secondary | ICD-10-CM | POA: Insufficient documentation

## 2016-02-21 DIAGNOSIS — Z905 Acquired absence of kidney: Secondary | ICD-10-CM | POA: Insufficient documentation

## 2016-02-21 DIAGNOSIS — Z8673 Personal history of transient ischemic attack (TIA), and cerebral infarction without residual deficits: Secondary | ICD-10-CM | POA: Insufficient documentation

## 2016-02-21 DIAGNOSIS — I480 Paroxysmal atrial fibrillation: Secondary | ICD-10-CM | POA: Diagnosis not present

## 2016-02-21 DIAGNOSIS — Z95810 Presence of automatic (implantable) cardiac defibrillator: Secondary | ICD-10-CM | POA: Diagnosis present

## 2016-02-21 DIAGNOSIS — I6523 Occlusion and stenosis of bilateral carotid arteries: Secondary | ICD-10-CM | POA: Diagnosis not present

## 2016-02-21 DIAGNOSIS — I272 Pulmonary hypertension, unspecified: Secondary | ICD-10-CM | POA: Insufficient documentation

## 2016-02-21 DIAGNOSIS — I5032 Chronic diastolic (congestive) heart failure: Secondary | ICD-10-CM | POA: Insufficient documentation

## 2016-02-21 HISTORY — PX: EP IMPLANTABLE DEVICE: SHX172B

## 2016-02-21 LAB — PROTIME-INR
INR: 2
Prothrombin Time: 23 seconds — ABNORMAL HIGH (ref 11.4–15.2)

## 2016-02-21 LAB — SURGICAL PCR SCREEN
MRSA, PCR: NEGATIVE
Staphylococcus aureus: NEGATIVE

## 2016-02-21 LAB — POCT INR: INR: 2

## 2016-02-21 SURGERY — ICD/BIV ICD GENERATOR CHANGEOUT
Anesthesia: LOCAL

## 2016-02-21 SURGERY — ICD/BIV ICD GENERATOR CHANGEOUT

## 2016-02-21 MED ORDER — PROMETHAZINE HCL 25 MG/ML IJ SOLN
12.5000 mg | INTRAMUSCULAR | Status: DC | PRN
  Administered 2016-02-21: 12.5 mg via INTRAVENOUS

## 2016-02-21 MED ORDER — SODIUM CHLORIDE 0.9 % IR SOLN
80.0000 mg | Status: AC
  Administered 2016-02-21: 80 mg

## 2016-02-21 MED ORDER — FENTANYL CITRATE (PF) 100 MCG/2ML IJ SOLN
INTRAMUSCULAR | Status: AC
  Filled 2016-02-21: qty 2

## 2016-02-21 MED ORDER — MUPIROCIN 2 % EX OINT
1.0000 "application " | TOPICAL_OINTMENT | Freq: Once | CUTANEOUS | Status: AC
  Administered 2016-02-21: 1 via TOPICAL

## 2016-02-21 MED ORDER — FENTANYL CITRATE (PF) 100 MCG/2ML IJ SOLN
INTRAMUSCULAR | Status: DC | PRN
  Administered 2016-02-21: 25 ug via INTRAVENOUS
  Administered 2016-02-21: 50 ug via INTRAVENOUS
  Administered 2016-02-21: 25 ug via INTRAVENOUS

## 2016-02-21 MED ORDER — SODIUM CHLORIDE 0.9 % IV SOLN
INTRAVENOUS | Status: DC
  Administered 2016-02-21: 11:00:00 via INTRAVENOUS

## 2016-02-21 MED ORDER — ONDANSETRON HCL 4 MG/2ML IJ SOLN
INTRAMUSCULAR | Status: AC
  Filled 2016-02-21: qty 2

## 2016-02-21 MED ORDER — MIDAZOLAM HCL 5 MG/5ML IJ SOLN
INTRAMUSCULAR | Status: AC
  Filled 2016-02-21: qty 5

## 2016-02-21 MED ORDER — SODIUM CHLORIDE 0.9 % IV SOLN: INTRAVENOUS | Status: AC

## 2016-02-21 MED ORDER — PROMETHAZINE HCL 25 MG/ML IJ SOLN
INTRAMUSCULAR | Status: AC
  Filled 2016-02-21: qty 1

## 2016-02-21 MED ORDER — LIDOCAINE HCL (PF) 1 % IJ SOLN
INTRAMUSCULAR | Status: AC
  Filled 2016-02-21: qty 60

## 2016-02-21 MED ORDER — ONDANSETRON HCL 4 MG/2ML IJ SOLN
INTRAMUSCULAR | Status: DC | PRN
  Administered 2016-02-21: 4 mg via INTRAVENOUS

## 2016-02-21 MED ORDER — LIDOCAINE HCL (PF) 1 % IJ SOLN
INTRAMUSCULAR | Status: DC | PRN
  Administered 2016-02-21: 45 mL via INTRADERMAL

## 2016-02-21 MED ORDER — CEFAZOLIN SODIUM-DEXTROSE 2-4 GM/100ML-% IV SOLN
2.0000 g | INTRAVENOUS | Status: AC
  Administered 2016-02-21: 2 g via INTRAVENOUS

## 2016-02-21 MED ORDER — CEFAZOLIN SODIUM-DEXTROSE 2-4 GM/100ML-% IV SOLN
INTRAVENOUS | Status: AC
  Filled 2016-02-21: qty 100

## 2016-02-21 MED ORDER — ONDANSETRON HCL 4 MG/2ML IJ SOLN: 4.0000 mg | Freq: Four times a day (QID) | INTRAMUSCULAR | Status: DC | PRN

## 2016-02-21 MED ORDER — MIDAZOLAM HCL 5 MG/5ML IJ SOLN
INTRAMUSCULAR | Status: DC | PRN
  Administered 2016-02-21 (×4): 1 mg via INTRAVENOUS

## 2016-02-21 MED ORDER — SODIUM CHLORIDE 0.9 % IR SOLN
Status: AC
  Filled 2016-02-21: qty 2

## 2016-02-21 MED ORDER — CHLORHEXIDINE GLUCONATE 4 % EX LIQD: 60.0000 mL | Freq: Once | CUTANEOUS | Status: DC

## 2016-02-21 MED ORDER — ACETAMINOPHEN 325 MG PO TABS
325.0000 mg | ORAL_TABLET | ORAL | Status: DC | PRN
Start: 2016-02-21 — End: 2016-02-21
  Filled 2016-02-21: qty 2

## 2016-02-21 SURGICAL SUPPLY — 5 items
CABLE SURGICAL S-101-97-12 (CABLE) ×2 IMPLANT
ICD EVERA XT DR DDBB1D1 (ICD Generator) ×2 IMPLANT
PAD DEFIB LIFELINK (PAD) ×2 IMPLANT
POUCH AIGIS-R ANTIBACT ICD (Mesh General) ×2 IMPLANT
TRAY PACEMAKER INSERTION (PACKS) ×2 IMPLANT

## 2016-02-21 NOTE — Discharge Instructions (Signed)
Pacemaker Battery Change, Care After °Refer to this sheet in the next few weeks. These instructions provide you with information on caring for yourself after your procedure. Your health care provider may also give you more specific instructions. Your treatment has been planned according to current medical practices, but problems sometimes occur. Call your health care provider if you have any problems or questions after your procedure. °WHAT TO EXPECT AFTER THE PROCEDURE °After your procedure, it is typical to have the following sensations: °· Soreness at the pacemaker site. °HOME CARE INSTRUCTIONS  °· Keep the incision clean and dry. °· Unless advised otherwise, you may shower beginning 48 hours after your procedure. °· For the first week after the replacement, avoid stretching motions that pull at the incision site, and avoid heavy exercise with the arm that is on the same side as the incision. °· Take medicines only as directed by your health care provider. °· Keep all follow-up visits as directed by your health care provider. °SEEK MEDICAL CARE IF:  °· You have pain at the incision site that is not relieved by over-the-counter or prescription medicine. °· There is drainage or pus from the incision site. °· There is swelling larger than a lime at the incision site. °· You develop red streaking that extends above or below the incision site. °· You feel brief, intermittent palpitations, light-headedness, or any symptoms that you feel might be related to your heart. °SEEK IMMEDIATE MEDICAL CARE IF:  °· You experience chest pain that is different than the pain at the pacemaker site. °· You experience shortness of breath. °· You have palpitations or irregular heartbeat. °· You have light-headedness that does not go away quickly. °· You faint. °· You have pain that gets worse and is not relieved by medicine. °  °This information is not intended to replace advice given to you by your health care provider. Make sure you  discuss any questions you have with your health care provider. °  °Document Released: 02/10/2013 Document Revised: 05/13/2014 Document Reviewed: 02/10/2013 °Elsevier Interactive Patient Education ©2016 Else[employer] Inc[employer] ° °

## 2016-02-21 NOTE — H&P (View-Only) (Signed)
Patient Care Team: Lucille Passy, MD as PCP - General Minna Merritts, MD as Consulting Physician (Cardiology) Deboraha Sprang, MD as Consulting Physician (Cardiology) Katha Cabal, MD as Consulting Physician (Vascular Surgery)   HPI  [name] is a 82 y.o. female Seen in followup for nonischemic cardiomyopathy for which she has a primary prevention ICD implanted initially 2006 with generator replacement about 5 years ago.   There have been concerns over recent months about her pocket which has been tender and swollen. It has been progressively less of a problem over months   Her device started beating about 10 days ago. This marks ERI.  She has paroxysmal atrial fibrillation; previously this was treated with disopyramide but was switched earlier this year to flecainide. It was however complicated by side effects and she went back on disopyramide. ( the d/c summary 4/16 was WRONG-describing the use of amiodarone)  She's had infrequent Afib  She has a CHADS-VASc score of greater than or equal to 6 (age-69, gender-1, TIA-2 (4/16), heart failure-1)  Echocardiogram 2015 demonstrated normal LV function moderate-severe MR moderate TR.  Echo 4/16 was reviewed and was similar with RV enlargement and PA pressure of 75-mod severe pulm HTN  Cath 2006 without obstructive disease  She has a complicated medical history in addition which includes renal cell carcinoma with prior resection, chronic renal insufficiency and COPD.  She has chronic stable SOB without edema but 2 pillow orthopnea     3/17 Creatinine was 1.3 K 4.9    Past Medical History:  Diagnosis Date  . Adjustment disorder with anxiety   . Anginal pain ([place])   . Arthritis    "some in my hands" (11/11/2012)  . Atrial fibrillation ([place])   . Automatic implantable cardioverter-defibrillator in situ   . Blood in stool   . Carotid artery stenosis 09/2007   60-79% bilateral (stable)  . Carotid artery stenosis 10/2008     40-59% R 60-79%   . Chest pain, unspecified   . Chronic airway obstruction, not elsewhere classified   . Chronic bronchitis ([place])    "get it some; not q year" (11/11/2012)  . Chronic diastolic CHF (congestive heart failure) ([place]) 06/04/2013  . Chronic kidney disease, unspecified ([place])   . Coronary artery disease    non-obstructive by 2006 cath  . Dysuria   . Edema   . Encounter for therapeutic drug monitoring   . Exertional shortness of breath   . Frequent UTI    "get them a couple times/yr" (11/11/2012)  . Hemorrhage of rectum and anus   . High cholesterol   . History of blood transfusion 04/2011   "after hip OR" (11/11/2012)  . Hypertension 05/20/2011  . Hypertrophic obstructive cardiomyopathy   . Hypotension, unspecified    cardiac cath 2006..nonobstructive CAD 30-40s lesions.Marland KitchenETT 1/09 nondiagnostic due to poor HR response..Right Renal Cancer 2003  . Long term (current) use of anticoagulants   . Malignant neoplasm of kidney, except pelvis   . Osteoarthritis of right hip   . Other acute sinusitis   . Other and unspecified coagulation defects   . Other malaise and fatigue   . Other screening mammogram   . PONV (postoperative nausea and vomiting)   . Pre-operative cardiovascular examination   . Renal cancer ([place]) 06/2001   Right  . Secondary cardiomyopathy, unspecified   . Sinus bradycardia   . Special screening for osteoporosis   . Urge incontinence     Past  Surgical History:  Procedure Laterality Date  . ABDOMINAL HYSTERECTOMY  1975   for benign causes  . APPENDECTOMY    . BI-VENTRICULAR PACEMAKER UPGRADE  05/04/2010  . CARDIAC CATHETERIZATION  2006  . CATARACT EXTRACTION W/ INTRAOCULAR LENS  IMPLANT, BILATERAL  01/2006-02-2006  . CHOLECYSTECTOMY N/A 11/11/2012   Procedure: LAPAROSCOPIC CHOLECYSTECTOMY WITH INTRAOPERATIVE CHOLANGIOGRAM;  Surgeon: Imogene Burn. Georgette Dover, MD;  Location: [facility];  Service: General;  Laterality: N/A;  . INSERT / REPLACE / REMOVE PACEMAKER  05-01-11    02-28-05-/05-04-10-ICD-MEDTRONIC MAXIMAL DR  . JOINT REPLACEMENT    . LAPAROSCOPIC CHOLECYSTECTOMY  11/11/2012  . LAPAROSCOPIC LYSIS OF ADHESIONS N/A 11/11/2012   Procedure: LAPAROSCOPIC LYSIS OF ADHESIONS;  Surgeon: Imogene Burn. Georgette Dover, MD;  Location: [facility];  Service: General;  Laterality: N/A;  . NEPHRECTOMY Right 06/2001    S/P RENAL CELL CANCER  . TOTAL HIP ARTHROPLASTY Right 05/03/2011   Procedure: TOTAL HIP ARTHROPLASTY ANTERIOR APPROACH;  Surgeon: Mcarthur Rossetti;  Location: [facility];  Service: Orthopedics;  Laterality: Right;  Removal of Cannulated Screws Right Hip, Right Direct Anterior Hip Replacement    Current Outpatient Prescriptions  Medication Sig Dispense Refill  . acetaminophen (TYLENOL) 500 MG tablet Take 1,000 mg by mouth every 6 (six) hours as needed for headache.    . carvedilol (COREG) 12.5 MG tablet Take 1 tablet (12.5 mg total) by mouth 2 (two) times daily with a meal. 60 tablet 10  . cetirizine (ZYRTEC) 10 MG tablet Take 10 mg by mouth daily.     . disopyramide (NORPACE) 150 MG capsule TAKE 1 CAPSULE BY MOUTH TWICE DAILY 60 capsule 6  . furosemide (LASIX) 40 MG tablet Take 1 tablet (40 mg total) by mouth 2 (two) times daily. 60 tablet 10  . rosuvastatin (CRESTOR) 40 MG tablet Take 1 tablet (40 mg total) by mouth daily. 90 tablet 3  . sildenafil (REVATIO) 20 MG tablet Take 1 tablet (20 mg total) by mouth 3 (three) times daily. 90 tablet 6  . warfarin (COUMADIN) 2 MG tablet TAKE AS DIRECTED BY MOUTH ANTI-COAGULATION CLINIC 120 tablet 3   No current facility-administered medications for this visit.     Allergies  Allergen Reactions  . Codeine Nausea And Vomiting  . Morphine And Related Nausea And Vomiting  . Sulfonamide Derivatives Other (See Comments)    REACTION: Dry mouth    Review of Systems negative except from HPI and PMH  Physical Exam BP 120/60   Pulse 63   Ht 5\' 6"  (1.676 m)   Wt 192 lb 9.6 oz (87.4 kg)   SpO2 98%   BMI 31.09 kg/m  Well developed  and well nourished in no acute distress HENT normal E scleral and icterus clear Neck Supple JVP flat; carotids brisk and full Device pocket well healed; without hematoma   There is no tethering   no erythema in the incision line of her device generator replacement Clear to ausculation   Regular rate and rhythm, no murmurs gallops or rub Soft with active bowel sounds No clubbing cyanosis  Edema Alert and oriented, grossly normal motor and sensory function Skin Warm and Dry  ECG demonstrates  AV pacing  Assessment and  Plan  Nonischemic cardiomyopathy with interval normalization of LV function  Sinus node dysfunction device dependent   Complete heart block intermittent  Atrial fibrillation   High risk medication surveillance-disopyramide  Device related chest pain   Renal insufficiency GFR  grade 3  10/16  Implantable defibrillator   Her  pocket is better.   Her device has reached ERI.    I am less impressed that infection will be found. The plan will be to open up the pocket and if it appears infected to close it and refer her for extraction. In the event that it looks okay, we will culture it reimplanted and using a antimicrobial pouch.  We will have a discussion at that point regarding high-voltage replacement or not  Infrequent Afib  Continue disopyramide--will check levels and Bmet

## 2016-02-21 NOTE — Interval H&P Note (Signed)
ICD Criteria  Current LVEF:60%. Within 12 months prior to implant: Yes   Heart failure history: Yes, Class II  Cardiomyopathy history: Yes, Non-Ischemic Cardiomyopathy.  Atrial Fibrillation/Atrial Flutter: Yes, Persistent (> 7 Days).  Ventricular tachycardia history: No.  Cardiac arrest history: No.  History of syndromes with risk of sudden death: No.  Previous ICD: Yes, Reason for ICD:  Primary prevention.  Current ICD indication: Primary  PPM indication: No.   Class I or II Bradycardia indication present: No  Beta Blocker therapy for 3 or more months: Yes, prescribed.   Ace Inhibitor/ARB therapy for 3 or more months: No, medical reason.  History and Physical Interval Note:  02/21/2016 12:14 PM  [name]  has presented today for surgery, with the diagnosis of hb  The various methods of treatment have been discussed with the patient and family. After consideration of risks, benefits and other options for treatment, the patient has consented to  Procedure(s): ICD [employer] (N/A) as a surgical intervention .  The patient's history has been reviewed, patient examined, no change in status, stable for surgery.  I have reviewed the patient's chart and labs.  Questions were answered to the patient's satisfaction.     [name]

## 2016-02-22 ENCOUNTER — Encounter (HOSPITAL_COMMUNITY): Payer: Self-pay | Admitting: Internal Medicine

## 2016-02-22 ENCOUNTER — Telehealth: Payer: Self-pay | Admitting: Family Medicine

## 2016-02-22 MED ORDER — CIPROFLOXACIN HCL 250 MG PO TABS: 250.0000 mg | ORAL_TABLET | Freq: Two times a day (BID) | ORAL | 0 refills | Status: DC

## 2016-02-22 MED FILL — Sodium Chloride Irrigation Soln 0.9%: Qty: 500 | Status: AC

## 2016-02-22 MED FILL — Gentamicin Sulfate Inj 40 MG/ML: INTRAMUSCULAR | Qty: 2 | Status: AC

## 2016-02-22 NOTE — Telephone Encounter (Addendum)
Patient called to report that she was discharged from the hospital yesterday following placement of a pacemaker.   She has the following questions:  1.  Can Dr. Deborra Medina call her in an antibiotic for possible UTI? She is weak from procedure yesterday and cannot come in today or tomorrow but says this is a re-occurring problem that the doctor is aware of and doesn't always make her come in for given sx's.  She has some burning and small amount of blood noted in urine.     2.  Also, is concerned about what to do with her Coumadin.  After discharge from day procedure yesterday, Dr. Caryl Comes did not give her any instructions.  This writer reviews coumadin and Dr. Caryl Comes procedure information with coumadin RN, [place] Her[facility].   It is determined that patient should resume her prior dosing of 2mg  M, T,W, F and 3mg  Th, Sat, Sun.   We will recheck here in the office lab this Monday (02/26/16) and determine further check/changes from there.  Patient made aware.   3.  Reports right hand is swollen after Iv was removed following procedure yesterday, wonders what to do.  I advised to ice, elevate and if any concerning streaking red up arm or unusual pains to have evaluated.     Patient would like a call back to let her know if antibiotic will be called in.

## 2016-02-22 NOTE — Telephone Encounter (Signed)
Pt left a somewhat garbled message on VM triage phone requesting information on her "blood thinners"  [place].  Please contact pt to get correct information.  [phone]

## 2016-02-22 NOTE — Telephone Encounter (Signed)
Yes we don't typically call in abx without seeing patient but give circumstances, will send in rx for 5 days of cipro.  This will also impact her coumadin.

## 2016-02-22 NOTE — Telephone Encounter (Signed)
Spoke with patient and made her aware of antibiotic called in to pharmacy.  Stressed the importance to come in to the Lab Monday to recheck INR level, patient verbalizes understanding.

## 2016-02-23 ENCOUNTER — Ambulatory Visit (INDEPENDENT_AMBULATORY_CARE_PROVIDER_SITE_OTHER): Payer: PPO | Admitting: General Practice

## 2016-02-23 DIAGNOSIS — Z7902 Long term (current) use of antithrombotics/antiplatelets: Secondary | ICD-10-CM

## 2016-02-24 LAB — AEROBIC CULTURE  (SUPERFICIAL SPECIMEN)

## 2016-02-24 LAB — AEROBIC CULTURE W GRAM STAIN (SUPERFICIAL SPECIMEN)
Culture: NO GROWTH
Gram Stain: NONE SEEN

## 2016-02-24 NOTE — Telephone Encounter (Signed)
   Pt of Dr. Rockey Situ and Dr. Caryl Comes with h/o NICM. She calls complaining of increased bilateral LEE. No increased dyspnea. No orthopnea or PND. She admits to dietary indiscretion with sodium. She has been eating a lot of canned soup this week. She has been compliant with Lasix, currently on 40 mg daily. She does not have a scale at home. Patient advised to take an extra lasix tablet this afternoon and, if needed, BID tomorrow. If no improvement, patient advised to call the office on Monday for notification. She may need an office f/u with an APP, BNP and f/u BMP. She was instructed to refrain from canned food products and other foods high in sodium. She is not on supplemental K. Patient instructed to eat a little bit more potassium rick foods such as bananas, tomatoes and orange juice over the next day or two given temporary increase in diuretic. She verbalized understanding.   Patches Mcdonnell Rosita Fire 02/24/2016

## 2016-02-26 ENCOUNTER — Other Ambulatory Visit (INDEPENDENT_AMBULATORY_CARE_PROVIDER_SITE_OTHER): Payer: PPO

## 2016-02-26 ENCOUNTER — Ambulatory Visit (INDEPENDENT_AMBULATORY_CARE_PROVIDER_SITE_OTHER): Payer: Self-pay

## 2016-02-26 ENCOUNTER — Telehealth: Payer: Self-pay | Admitting: Internal Medicine

## 2016-02-26 DIAGNOSIS — Z7902 Long term (current) use of antithrombotics/antiplatelets: Secondary | ICD-10-CM

## 2016-02-26 DIAGNOSIS — I4891 Unspecified atrial fibrillation: Secondary | ICD-10-CM

## 2016-02-26 DIAGNOSIS — Z7901 Long term (current) use of anticoagulants: Secondary | ICD-10-CM | POA: Diagnosis not present

## 2016-02-26 DIAGNOSIS — Z5181 Encounter for therapeutic drug level monitoring: Secondary | ICD-10-CM | POA: Diagnosis not present

## 2016-02-26 LAB — POCT INR: INR: 2.3

## 2016-02-26 NOTE — Telephone Encounter (Signed)
Pt c/o swelling: STAT is pt has developed SOB within 24 hours  1. How long have you been experiencing swelling? Started the night after the procedure   2. Where is the swelling located? Legs and feet  3.  Are you currently taking a "fluid pill"? Yes   4.  Are you currently SOB? no  5.  Have you traveled recently? No

## 2016-02-26 NOTE — Telephone Encounter (Signed)
I called and spoke with the patient. She reports that she noticed that her legs started to swell on Thursday. She had an ICD generator change out on Wednesday (10/18). She states she did eat some [employer] soup for about 2 days after because she did not feel like cooking. We discussed sodium content in canned soup, otherwise, no change in sodium/ fluid intake. Swelling is in her bilateral lower extremities up to her knees. She reports speaking with the on-call MD over the weekend and she was instructed to take an extra 40 mg on Saturday and Sunday which she did in addition to her normal 40 mg BID dose.  She has not seen much difference in her swelling and her urination has not increased. Her SOB is at baseline for her. I advised her that Dr. Caryl Comes is out all week so will forward to Dr. Rockey Situ (primary cards) for review. She is aware that she will hear back from myself or [facility] after Dr. Rockey Situ reviews. She is agreeable.

## 2016-02-26 NOTE — Patient Instructions (Signed)
Pre visit review using our clinic review tool, if applicable. No additional management support is needed unless otherwise documented below in the visit note. 

## 2016-02-27 NOTE — Progress Notes (Signed)
I have reviewed and agree with the plan. 

## 2016-02-28 NOTE — Telephone Encounter (Signed)
Spoke w/ pt.  She reports that she is feeling better and stronger today.  She has resumed sleeping in her bed and feels this has helped.  Advised her of Dr. Donivan Scull recommendation.  She is appreciative and will call back in a few days if her sx have not resolved.

## 2016-02-28 NOTE — Telephone Encounter (Signed)
Would recommend that she take Lasix 80 mg twice a day for a few days until back to baseline  Then decrease Lasix to 40 twice a day

## 2016-03-04 ENCOUNTER — Ambulatory Visit (INDEPENDENT_AMBULATORY_CARE_PROVIDER_SITE_OTHER): Payer: PPO | Admitting: *Deleted

## 2016-03-04 DIAGNOSIS — I428 Other cardiomyopathies: Secondary | ICD-10-CM | POA: Diagnosis not present

## 2016-03-04 LAB — CUP PACEART INCLINIC DEVICE CHECK
Battery Remaining Longevity: 98 mo
Battery Voltage: 3.09 V
Brady Statistic AP VP Percent: 99.11 %
Brady Statistic AP VS Percent: 0 %
Brady Statistic AS VP Percent: 0.89 %
Brady Statistic AS VS Percent: 0 %
Brady Statistic RA Percent Paced: 99.05 %
Brady Statistic RV Percent Paced: 99.96 %
Date Time Interrogation Session: 20171030162655
HighPow Impedance: 33 Ohm
HighPow Impedance: 43 Ohm
Implantable Lead Implant Date: 20061026
Implantable Lead Implant Date: 20111230
Implantable Lead Location: 753859
Implantable Lead Location: 753860
Implantable Lead Model: 5076
Implantable Lead Model: 6947
Implantable Pulse Generator Implant Date: 20171018
Lead Channel Impedance Value: 361 Ohm
Lead Channel Impedance Value: 418 Ohm
Lead Channel Impedance Value: 456 Ohm
Lead Channel Pacing Threshold Amplitude: 0.75 V
Lead Channel Pacing Threshold Amplitude: 0.75 V
Lead Channel Pacing Threshold Pulse Width: 0.4 ms
Lead Channel Pacing Threshold Pulse Width: 0.4 ms
Lead Channel Setting Pacing Amplitude: 2 V
Lead Channel Setting Pacing Amplitude: 2.5 V
Lead Channel Setting Pacing Pulse Width: 0.4 ms
Lead Channel Setting Sensing Sensitivity: 0.3 mV

## 2016-03-04 NOTE — Progress Notes (Signed)
Wound check appointment. Dermabond removed. Wound without redness or edema. Incision edges approximated, wound well healed. Normal device function. Thresholds, sensing, and impedances consistent with implant measurements. Device programmed at chronic values s/p gen change. Histogram distribution appropriate for patient and level of activity. No mode switches or high ventricular rates noted. Patient educated about wound care. ROV 05/2016 with SK

## 2016-03-05 ENCOUNTER — Ambulatory Visit (INDEPENDENT_AMBULATORY_CARE_PROVIDER_SITE_OTHER): Payer: PPO | Admitting: Cardiovascular Disease

## 2016-03-05 ENCOUNTER — Encounter: Payer: Self-pay | Admitting: Cardiovascular Disease

## 2016-03-05 VITALS — BP 160/70 | HR 69 | Ht 67.0 in | Wt 197.1 lb

## 2016-03-05 DIAGNOSIS — E7800 Pure hypercholesterolemia, unspecified: Secondary | ICD-10-CM | POA: Diagnosis not present

## 2016-03-05 DIAGNOSIS — I272 Pulmonary hypertension, unspecified: Secondary | ICD-10-CM

## 2016-03-05 DIAGNOSIS — Z959 Presence of cardiac and vascular implant and graft, unspecified: Secondary | ICD-10-CM

## 2016-03-05 DIAGNOSIS — I5032 Chronic diastolic (congestive) heart failure: Secondary | ICD-10-CM

## 2016-03-05 DIAGNOSIS — I6523 Occlusion and stenosis of bilateral carotid arteries: Secondary | ICD-10-CM

## 2016-03-05 DIAGNOSIS — I4891 Unspecified atrial fibrillation: Secondary | ICD-10-CM | POA: Diagnosis not present

## 2016-03-05 DIAGNOSIS — I10 Essential (primary) hypertension: Secondary | ICD-10-CM

## 2016-03-05 MED ORDER — FUROSEMIDE 40 MG PO TABS: 40.0000 mg | ORAL_TABLET | Freq: Three times a day (TID) | ORAL | 10 refills | Status: DC

## 2016-03-05 NOTE — Patient Instructions (Signed)
Medication Instructions:   Please take extra lasix as needed for shortness of breath,  Try 1/2 pill revatio three times a day Slowly increase up to a full pill  Labwork:  No new labs needed  Testing/Procedures:  No further testing at this time   Follow-Up: It was a pleasure seeing you in the office today. Please call us if you have new issues that need to be addressed before your next appt.  [phone]  Your physician wants you to follow-up in: 6 months.  You will receive a reminder letter in the mail two months in advance. If you don't receive a letter, please call our office to schedule the follow-up appointment.  If you need a refill on your cardiac medications before your next appointment, please call your pharmacy.

## 2016-03-05 NOTE — Progress Notes (Signed)
Cardiology Office Note  Date:  03/05/2016   ID:  [name], DOB [birth year], MRN [id number]  PCP:  [name], MD   Chief Complaint  Patient presents with  . Cardiac device in situ  . Shortness of Breath    HPI:  [name] is a 52On her last clinic visit-year-old woman with a history of syncope, COPD, as well as symptomatic bradycardia. She is status post ICD. history of nonobstructive coronary artery disease with chronic chest pain, bilateral carotid artery stenosis 60-70% disease on the left, paroxysmal atrial fibrillation, renal cell carcinoma status post resection of her right kidney with residual chronic renal insufficiency, and COPD, stopped smoking over 10 years ago.  She has chronic leg pain She presents for routine follow-up of her carotid arterial disease, atrial fibrillation. She has a single kidney, creatinine 1.4 She presents for recent episode of chest pain, chronic shortness of breath Echocardiogram 2016 and most recently 2017 with elevated right heart pressures, 70 mmHg  On her last clinic visit we added revatio 20 mg 3 times a day for pulmonary hypertension. She reports escape her headache and stopped the medication  1 week ago had general malaise, felt very cold She was concerned she may have had a TIA. No focal neurologic deficits. Had severe ankle swelling after pacemaker was changed out in the hospital Suspect she was given significant fluids She did take extra Lasix in addition to her 40 twice a day. Edema slowly resolved Still does not feel at her baseline but slowly feeling better Wonders if it could be from urinary tract infection, was treated with Cipro She wonders if she could have residual infection No significant chest pain Weight is up 5 pounds from her prior clinic visit  Other past medical history reviewed History of periodic symptoms of chest tightness, shortness of breath Smoked 30 years, quit 20 years, Takes Lasix 40 mg twice a  day, Previously recommended to take higher dose Lasix for several days in a row if she develops symptoms by Dr. Caryl Comes Has chronic leg pain  Echocardiogram results reviewed with her in detail, elevated right heart pressures, normal ejection fraction  In general she is sedentary, no regular exercise program Defibrillator download on today's visit as she is concerned about her battery No significant atrial fibrillation or atrial tachycardia episodes, perhaps longer episode in January 2017 otherwise 4 episodes lasting less than 2 hours She is happy with her norpace   Carotid ultrasound reviewed with her showing stable moderate to severe bilateral carotid disease Other past medical history  She did try flecainide in the past and she had significant side effects as well as breakthrough arrhythmia.  right hip replacement on May 03 2011.  Chronic dizziness since the summer of 2014 . Symptoms were worse in December 2014  Echocardiogram showed normal ejection fraction, moderate mitral valve regurgitation, moderate TR, moderately elevated right ventricular systolic pressures.  stress test in March 2011.   Last carotid u/s 40-59% R 60-79%. No neuro symptoms.   Cardiac cath 2006 --non-obstructive CAD 30-40s lesions She reports having repeat catheterization since that time with no significant progression of her disease   PMH:   has a past medical history of Adjustment disorder with anxiety; Anginal pain ([place]); Arthritis; Atrial fibrillation ([place]); Automatic implantable cardioverter-defibrillator in situ; Blood in stool; Carotid artery stenosis (09/2007); Carotid artery stenosis (10/2008); Chest pain, unspecified; Chronic airway obstruction, not elsewhere classified; Chronic bronchitis ([place]); Chronic diastolic CHF (congestive heart failure) ([place]) (06/04/2013); Chronic kidney disease, unspecified;  Coronary artery disease; Dysuria; Edema; Encounter for therapeutic drug monitoring;  Exertional shortness of breath; Frequent UTI; Hemorrhage of rectum and anus; High cholesterol; History of blood transfusion (04/2011); Hypertension (05/20/2011); Hypertr obst cardiomyop; Hypotension, unspecified; Long term (current) use of anticoagulants; Malignant neoplasm of kidney, except pelvis; Osteoarthritis of right hip; Other acute sinusitis; Other and unspecified coagulation defects; Other malaise and fatigue; Other screening mammogram; PONV (postoperative nausea and vomiting); Pre-operative cardiovascular examination; Renal cancer ([place]) (06/2001); Secondary cardiomyopathy, unspecified; Sinus bradycardia; Special screening for osteoporosis; and Urge incontinence.  PSH:    Past Surgical History:  Procedure Laterality Date  . ABDOMINAL HYSTERECTOMY  1975   for benign causes  . APPENDECTOMY    . BI-VENTRICULAR PACEMAKER UPGRADE  05/04/2010  . CARDIAC CATHETERIZATION  2006  . CATARACT EXTRACTION W/ INTRAOCULAR LENS  IMPLANT, BILATERAL  01/2006-02-2006  . CHOLECYSTECTOMY N/A 11/11/2012   Procedure: LAPAROSCOPIC CHOLECYSTECTOMY WITH INTRAOPERATIVE CHOLANGIOGRAM;  Surgeon: Imogene Burn. Georgette Dover, MD;  Location: [facility];  Service: General;  Laterality: N/A;  . EP IMPLANTABLE DEVICE N/A 02/21/2016   Procedure: ICD Generator Changeout;  Surgeon: Deboraha Sprang, MD;  Location: [facility];  Service: Cardiovascular;  Laterality: N/A;  . INSERT / REPLACE / REMOVE PACEMAKER  05-01-11   02-28-05-/05-04-10-ICD-MEDTRONIC MAXIMAL DR  . JOINT REPLACEMENT    . LAPAROSCOPIC CHOLECYSTECTOMY  11/11/2012  . LAPAROSCOPIC LYSIS OF ADHESIONS N/A 11/11/2012   Procedure: LAPAROSCOPIC LYSIS OF ADHESIONS;  Surgeon: Imogene Burn. Georgette Dover, MD;  Location: [facility];  Service: General;  Laterality: N/A;  . NEPHRECTOMY Right 06/2001    S/P RENAL CELL CANCER  . TOTAL HIP ARTHROPLASTY Right 05/03/2011   Procedure: TOTAL HIP ARTHROPLASTY ANTERIOR APPROACH;  Surgeon: Mcarthur Rossetti;  Location: [facility];  Service: Orthopedics;   Laterality: Right;  Removal of Cannulated Screws Right Hip, Right Direct Anterior Hip Replacement    Current Outpatient Prescriptions  Medication Sig Dispense Refill  . acetaminophen (TYLENOL) 500 MG tablet Take 1,000 mg by mouth every 6 (six) hours as needed for headache.    . carvedilol (COREG) 12.5 MG tablet Take 1 tablet (12.5 mg total) by mouth 2 (two) times daily with a meal. 60 tablet 10  . cetirizine (ZYRTEC) 10 MG tablet Take 10 mg by mouth daily.     . disopyramide (NORPACE) 150 MG capsule TAKE 1 CAPSULE BY MOUTH TWICE DAILY 60 capsule 6  . furosemide (LASIX) 40 MG tablet Take 1 tablet (40 mg total) by mouth 3 (three) times daily. 90 tablet 10  . rosuvastatin (CRESTOR) 40 MG tablet Take 1 tablet (40 mg total) by mouth daily. 90 tablet 3  . sildenafil (REVATIO) 20 MG tablet Take 1 tablet (20 mg total) by mouth 3 (three) times daily. 90 tablet 6  . warfarin (COUMADIN) 2 MG tablet TAKE AS DIRECTED BY MOUTH ANTI-COAGULATION CLINIC 120 tablet 3   No current facility-administered medications for this visit.      Allergies:   Codeine; Morphine and related; and Sulfonamide derivatives   Social History:  The patient  reports that she quit smoking about 14 years ago. Her smoking use included Cigarettes. She has a 20.00 pack-year smoking history. She has never used smokeless tobacco. She reports that she does not drink alcohol or use drugs.   Family History:   family history includes Breast cancer in her cousin; Breast cancer (age of onset: 77) in her maternal aunt; Heart failure in her mother.    Review of Systems: Review of Systems  Constitutional: Positive for  malaise/fatigue.  Respiratory: Positive for shortness of breath.   Cardiovascular: Positive for leg swelling.  Gastrointestinal: Negative.   Musculoskeletal: Negative.   Neurological: Negative.   Psychiatric/Behavioral: Negative.   All other systems reviewed and are negative.    PHYSICAL EXAM: VS:  BP (!) 160/70    Pulse 69   Ht 5\' 7"  (1.702 m)   Wt 197 lb 1.9 oz (89.4 kg)   SpO2 97%   BMI 30.87 kg/m  , BMI Body mass index is 30.87 kg/m. GEN: Well nourished, well developed, in no acute distress  HEENT: normal  Neck: no JVD, carotid bruits, or masses Cardiac: RRR; no murmurs, rubs, or gallops,no edema  Respiratory:  clear to auscultation bilaterally, normal work of breathing GI: soft, nontender, nondistended, + BS MS: no deformity or atrophy  Skin: warm and dry, no rash Neuro:  Strength and sensation are intact Psych: euthymic mood, full affect    Recent Labs: 02/12/2016: BUN 20; Creatinine, Ser 1.70; Hemoglobin 10.1; Platelets 174.0; Potassium 3.8; Sodium 139    Lipid Panel Lab Results  Component Value Date   CHOL 203 (H) 03/06/2015   HDL 39.20 03/06/2015   LDLCALC 97 08/17/2014   TRIG 256.0 (H) 03/06/2015      Wt Readings from Last 3 Encounters:  03/05/16 197 lb 1.9 oz (89.4 kg)  02/21/16 190 lb (86.2 kg)  01/25/16 192 lb 9.6 oz (87.4 kg)       ASSESSMENT AND PLAN:  Cardiac device in situ Recent device change out by Dr. Caryl Comes Likely receive some IV fluids during the procedure, required extra Lasix as an outpatient Now feels back to her baseline, edema resolved  Pure hypercholesterolemia Tolerating Crestor 40 mg daily  Atrial fibrillation, unspecified type ([place]) On anticoagulation  Bilateral carotid artery stenosis  Essential hypertension Blood pressure elevated. She reports it is typically well controlled She will monitor her pressures at home and call us if this continues to run high Could take extra Lasix for hypertension  Chronic diastolic CHF (congestive heart failure) ([place]) Long discussion concerning what to do for ankle swelling. Currently takes Lasix 40 mg twice a day. For mild swelling, would take 80 mg in the morning, 40 mg after lunch. For severe swelling or shortness of breath, 80 mg twice a day until symptoms improve  Pulmonary hypertension  (HCC) Recommended she retry revatio 10 mg 3 times a day with slow titration up to 20 mg 3 times a day if tolerated    Total encounter time more than 25 minutes  Greater than 50% was spent in counseling and coordination of care with the patient   Disposition:   F/U  6 months    Signed, Esmond Plants, M.D., Ph.D. 03/05/2016  [place], [place]

## 2016-03-06 ENCOUNTER — Encounter: Payer: Self-pay | Admitting: Family Medicine

## 2016-03-06 ENCOUNTER — Ambulatory Visit (INDEPENDENT_AMBULATORY_CARE_PROVIDER_SITE_OTHER): Payer: PPO | Admitting: Family Medicine

## 2016-03-06 VITALS — BP 114/68 | HR 70 | Temp 97.4°F | Wt 199.2 lb

## 2016-03-06 DIAGNOSIS — R300 Dysuria: Secondary | ICD-10-CM | POA: Diagnosis not present

## 2016-03-06 LAB — POC URINALSYSI DIPSTICK (AUTOMATED)
Bilirubin, UA: NEGATIVE
Glucose, UA: NEGATIVE
Ketones, UA: NEGATIVE
Leukocytes, UA: NEGATIVE
Nitrite, UA: NEGATIVE
Protein, UA: NEGATIVE
Spec Grav, UA: 1.015
Urobilinogen, UA: 0.2
pH, UA: 6

## 2016-03-06 NOTE — Addendum Note (Signed)
Addended by: Modena Nunnery on: 03/06/2016 11:33 AM   Modules accepted: Orders

## 2016-03-06 NOTE — Progress Notes (Signed)
SUBJECTIVE: [name] is a 82 y.o. female who complains of urinary frequency, urgency without  dysuria flank pain, fever, chills, or abnormal vaginal discharge or bleeding x 2 days.   Recently finished course of cipro and had pace maker changed.  Over all doing well.  Current Outpatient Prescriptions on File Prior to Visit  Medication Sig Dispense Refill  . acetaminophen (TYLENOL) 500 MG tablet Take 1,000 mg by mouth every 6 (six) hours as needed for headache.    . carvedilol (COREG) 12.5 MG tablet Take 1 tablet (12.5 mg total) by mouth 2 (two) times daily with a meal. 60 tablet 10  . cetirizine (ZYRTEC) 10 MG tablet Take 10 mg by mouth daily.     . disopyramide (NORPACE) 150 MG capsule TAKE 1 CAPSULE BY MOUTH TWICE DAILY 60 capsule 6  . furosemide (LASIX) 40 MG tablet Take 1 tablet (40 mg total) by mouth 3 (three) times daily. 90 tablet 10  . rosuvastatin (CRESTOR) 40 MG tablet Take 1 tablet (40 mg total) by mouth daily. 90 tablet 3  . sildenafil (REVATIO) 20 MG tablet Take 1 tablet (20 mg total) by mouth 3 (three) times daily. 90 tablet 6  . warfarin (COUMADIN) 2 MG tablet TAKE AS DIRECTED BY MOUTH ANTI-COAGULATION CLINIC 120 tablet 3  . [DISCONTINUED] hydrochlorothiazide (HYDRODIURIL) 25 MG tablet Take 25 mg by mouth daily after breakfast.      No current facility-administered medications on file prior to visit.     Allergies  Allergen Reactions  . Codeine Nausea And Vomiting  . Morphine And Related Nausea And Vomiting  . Sulfonamide Derivatives Other (See Comments)    REACTION: Dry mouth    Past Medical History:  Diagnosis Date  . Adjustment disorder with anxiety   . Anginal pain ([place])   . Arthritis    "some in my hands" (11/11/2012)  . Atrial fibrillation ([place])   . Automatic implantable cardioverter-defibrillator in situ   . Blood in stool   . Carotid artery stenosis 09/2007   60-79% bilateral (stable)  . Carotid artery stenosis 10/2008   40-59% R 60-79%   . Chest  pain, unspecified   . Chronic airway obstruction, not elsewhere classified   . Chronic bronchitis ([place])    "get it some; not q year" (11/11/2012)  . Chronic diastolic CHF (congestive heart failure) ([place]) 06/04/2013  . Chronic kidney disease, unspecified   . Coronary artery disease    non-obstructive by 2006 cath  . Dysuria   . Edema   . Encounter for therapeutic drug monitoring   . Exertional shortness of breath   . Frequent UTI    "get them a couple times/yr" (11/11/2012)  . Hemorrhage of rectum and anus   . High cholesterol   . History of blood transfusion 04/2011   "after hip OR" (11/11/2012)  . Hypertension 05/20/2011  . Hypertr obst cardiomyop   . Hypotension, unspecified    cardiac cath 2006..nonobstructive CAD 30-40s lesions.Marland KitchenETT 1/09 nondiagnostic due to poor HR response..Right Renal Cancer 2003  . Long term (current) use of anticoagulants   . Malignant neoplasm of kidney, except pelvis   . Osteoarthritis of right hip   . Other acute sinusitis   . Other and unspecified coagulation defects   . Other malaise and fatigue   . Other screening mammogram   . PONV (postoperative nausea and vomiting)   . Pre-operative cardiovascular examination   . Renal cancer ([place]) 06/2001   Right  . Secondary cardiomyopathy, unspecified   .  Sinus bradycardia   . Special screening for osteoporosis   . Urge incontinence     Past Surgical History:  Procedure Laterality Date  . ABDOMINAL HYSTERECTOMY  1975   for benign causes  . APPENDECTOMY    . BI-VENTRICULAR PACEMAKER UPGRADE  05/04/2010  . CARDIAC CATHETERIZATION  2006  . CATARACT EXTRACTION W/ INTRAOCULAR LENS  IMPLANT, BILATERAL  01/2006-02-2006  . CHOLECYSTECTOMY N/A 11/11/2012   Procedure: LAPAROSCOPIC CHOLECYSTECTOMY WITH INTRAOPERATIVE CHOLANGIOGRAM;  Surgeon: Imogene Burn. Georgette Dover, MD;  Location: [facility];  Service: General;  Laterality: N/A;  . EP IMPLANTABLE DEVICE N/A 02/21/2016   Procedure: ICD Generator Changeout;  Surgeon: Deboraha Sprang,  MD;  Location: [facility];  Service: Cardiovascular;  Laterality: N/A;  . INSERT / REPLACE / REMOVE PACEMAKER  05-01-11   02-28-05-/05-04-10-ICD-MEDTRONIC MAXIMAL DR  . JOINT REPLACEMENT    . LAPAROSCOPIC CHOLECYSTECTOMY  11/11/2012  . LAPAROSCOPIC LYSIS OF ADHESIONS N/A 11/11/2012   Procedure: LAPAROSCOPIC LYSIS OF ADHESIONS;  Surgeon: Imogene Burn. Georgette Dover, MD;  Location: [facility];  Service: General;  Laterality: N/A;  . NEPHRECTOMY Right 06/2001    S/P RENAL CELL CANCER  . TOTAL HIP ARTHROPLASTY Right 05/03/2011   Procedure: TOTAL HIP ARTHROPLASTY ANTERIOR APPROACH;  Surgeon: Mcarthur Rossetti;  Location: [facility];  Service: Orthopedics;  Laterality: Right;  Removal of Cannulated Screws Right Hip, Right Direct Anterior Hip Replacement    Family History  Problem Relation Age of Onset  . Heart failure Mother   . Breast cancer Maternal Aunt 82  . Breast cancer Cousin   . Colon cancer Neg Hx     Social History   Social History  . Marital status: Widowed    Spouse name: N/A  . Number of children: N/A  . Years of education: N/A   Occupational History  . Retired Retired   Social History Main Topics  . Smoking status: Former Smoker    Packs/day: 0.50    Years: 40.00    Types: Cigarettes    Quit date: 05/06/2001  . Smokeless tobacco: Never Used  . Alcohol use No  . Drug use: No  . Sexual activity: No   Other Topics Concern  . 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   ALCOHOL USE-NO   RETIRED   FORMER TOBACCO USE.....1/2 PACK X 44 YRS UNTIL 2003   Would desire CPR   The PMH, PSH, Social History, Family History, Medications, and allergies have been reviewed in [facility], and have been updated if relevant.  OBJECTIVE:  BP 114/68   Pulse 70   Temp 97.4 F (36.3 C) (Oral)   Wt 199 lb 4 oz (90.4 kg)   SpO2 98%    BMI 31.21 kg/m   Appears well, in no apparent distress.  Vital signs are normal. The abdomen is soft without tenderness, guarding, mass, rebound or organomegaly. No CVA tenderness or inguinal adenopathy noted. Urine dipstick shows pos for RBCs only  ASSESSMENT: UA pos for hematuria only - she on coumadin  PLAN: send urine for cx, no tx at this time. The patient indicates understanding of these issues and agrees with the plan.

## 2016-03-06 NOTE — Progress Notes (Signed)
Pre visit review using our clinic review tool, if applicable. No additional management support is needed unless otherwise documented below in the visit note. 

## 2016-03-08 ENCOUNTER — Telehealth: Payer: Self-pay

## 2016-03-08 ENCOUNTER — Telehealth: Payer: Self-pay | Admitting: Internal Medicine

## 2016-03-08 LAB — URINE CULTURE

## 2016-03-08 NOTE — Telephone Encounter (Signed)
Urine culture did not show an infection. How is she feeling?

## 2016-03-08 NOTE — Telephone Encounter (Signed)
Spoke to pt and advised per Dr Aron.  

## 2016-03-08 NOTE — Telephone Encounter (Signed)
Pt left v/m requesting results of urine culture from 03/06/16.

## 2016-03-08 NOTE — Telephone Encounter (Signed)
Pt states since she had her pacemaker change out on 10/18, she has felt lethargic and lightheaded. She wants to know if this is normal. Please call and advise.

## 2016-03-08 NOTE — Telephone Encounter (Signed)
Drink plenty of water.  Hopefully this is all coming from recovery from her procedure.  Please keep [place] updated.

## 2016-03-08 NOTE — Telephone Encounter (Signed)
Pt stated that she has been fatigued and c/o of more SOB since PPM 10/18. Pt denied fever or chills, or redness as PPM site. Informed pt that remote transmission was received and was normal. Pt stated that she has seen Dr. Rockey Situ and he advised her to start taking Revatio again pt stated she had not been taking it d/t headaches. Pt stated that she had started taking it on 03/06/2016. Encouraged pt to keep taking medication and to call Dr. Gwenyth Ober office next week if she was still feeling poorly and to call device clinic as well. Pt voiced understanding.

## 2016-03-08 NOTE — Telephone Encounter (Signed)
Spoke to pt and informed her of results. States she is feeling about the same

## 2016-03-11 ENCOUNTER — Other Ambulatory Visit (INDEPENDENT_AMBULATORY_CARE_PROVIDER_SITE_OTHER): Payer: PPO

## 2016-03-11 ENCOUNTER — Other Ambulatory Visit: Payer: PPO

## 2016-03-11 DIAGNOSIS — Z5181 Encounter for therapeutic drug level monitoring: Secondary | ICD-10-CM

## 2016-03-11 DIAGNOSIS — Z7901 Long term (current) use of anticoagulants: Secondary | ICD-10-CM | POA: Diagnosis not present

## 2016-03-11 DIAGNOSIS — I4891 Unspecified atrial fibrillation: Secondary | ICD-10-CM

## 2016-03-11 LAB — POCT INR: INR: 2

## 2016-03-17 ENCOUNTER — Other Ambulatory Visit: Payer: Self-pay | Admitting: Family Medicine

## 2016-03-17 DIAGNOSIS — Z01419 Encounter for gynecological examination (general) (routine) without abnormal findings: Secondary | ICD-10-CM

## 2016-03-19 ENCOUNTER — Other Ambulatory Visit: Payer: PPO

## 2016-03-19 ENCOUNTER — Ambulatory Visit (INDEPENDENT_AMBULATORY_CARE_PROVIDER_SITE_OTHER): Payer: PPO

## 2016-03-19 VITALS — BP 122/64 | HR 60 | Temp 98.0°F | Ht 65.5 in | Wt 196.0 lb

## 2016-03-19 DIAGNOSIS — E538 Deficiency of other specified B group vitamins: Secondary | ICD-10-CM

## 2016-03-19 DIAGNOSIS — Z0000 Encounter for general adult medical examination without abnormal findings: Secondary | ICD-10-CM | POA: Diagnosis not present

## 2016-03-19 DIAGNOSIS — Z01419 Encounter for gynecological examination (general) (routine) without abnormal findings: Secondary | ICD-10-CM

## 2016-03-19 LAB — LIPID PANEL
Cholesterol: 179 mg/dL (ref 0–200)
HDL: 39.1 mg/dL (ref >39.00)
LDL Cholesterol: 118 mg/dL — ABNORMAL HIGH (ref 0–99)
NonHDL: 140.23
Total CHOL/HDL Ratio: 5
Triglycerides: 110 mg/dL (ref 0.0–149.0)
VLDL: 22 mg/dL (ref 0.0–40.0)

## 2016-03-19 LAB — COMPREHENSIVE METABOLIC PANEL
ALT: 10 U/L (ref 0–35)
AST: 17 U/L (ref 0–37)
Albumin: 4.3 g/dL (ref 3.5–5.2)
Alkaline Phosphatase: 56 U/L (ref 39–117)
BUN: 20 mg/dL (ref 6–23)
CO2: 30 mEq/L (ref 19–32)
Calcium: 9.3 mg/dL (ref 8.4–10.5)
Chloride: 103 mEq/L (ref 96–112)
Creatinine, Ser: 1.59 mg/dL — ABNORMAL HIGH (ref 0.40–1.20)
GFR: 33.2 mL/min — ABNORMAL LOW (ref >60.00)
Glucose, Bld: 92 mg/dL (ref 70–99)
Potassium: 4.1 mEq/L (ref 3.5–5.1)
Sodium: 141 mEq/L (ref 135–145)
Total Bilirubin: 0.6 mg/dL (ref 0.2–1.2)
Total Protein: 6.9 g/dL (ref 6.0–8.3)

## 2016-03-19 LAB — TSH: TSH: 2.13 u[IU]/mL (ref 0.35–4.50)

## 2016-03-19 LAB — CBC WITH DIFFERENTIAL/PLATELET
Basophils Absolute: 0 10*3/uL (ref 0.0–0.1)
Basophils Relative: 0.6 % (ref 0.0–3.0)
Eosinophils Absolute: 0.1 10*3/uL (ref 0.0–0.7)
Eosinophils Relative: 1.6 % (ref 0.0–5.0)
HCT: 31.9 % — ABNORMAL LOW (ref 36.0–46.0)
Hemoglobin: 10.3 g/dL — ABNORMAL LOW (ref 12.0–15.0)
Lymphocytes Relative: 26 % (ref 12.0–46.0)
Lymphs Abs: 0.9 10*3/uL (ref 0.7–4.0)
MCHC: 32.3 g/dL (ref 30.0–36.0)
MCV: 78.4 fl (ref 78.0–100.0)
Monocytes Absolute: 0.5 10*3/uL (ref 0.1–1.0)
Monocytes Relative: 13.5 % — ABNORMAL HIGH (ref 3.0–12.0)
Neutro Abs: 2.1 10*3/uL (ref 1.4–7.7)
Neutrophils Relative %: 58.3 % (ref 43.0–77.0)
Platelets: 206 10*3/uL (ref 150.0–400.0)
RBC: 4.06 Mil/uL (ref 3.87–5.11)
RDW: 17.6 % — ABNORMAL HIGH (ref 11.5–15.5)
WBC: 3.6 10*3/uL — ABNORMAL LOW (ref 4.0–10.5)

## 2016-03-19 MED ORDER — CYANOCOBALAMIN 1000 MCG/ML IJ SOLN
1000.0000 ug | Freq: Once | INTRAMUSCULAR | Status: AC
  Administered 2016-03-19: 1000 ug via INTRAMUSCULAR

## 2016-03-19 NOTE — Progress Notes (Signed)
I reviewed health advisor's note, was available for consultation, and agree with documentation and plan.  

## 2016-03-19 NOTE — Progress Notes (Signed)
PCP notes:   Health maintenance:  Mammogram - pt will discuss with PCP at CPE  Abnormal screenings:   Hearing - failed  Patient concerns:   Pt requested B12 injection. Injection administered.   Nurse concerns:  None  Next PCP appt:   03/26/16 @ 1215

## 2016-03-19 NOTE — Progress Notes (Signed)
Pre visit review using our clinic review tool, if applicable. No additional management support is needed unless otherwise documented below in the visit note. 

## 2016-03-19 NOTE — Progress Notes (Signed)
Subjective:   [name] is a 82 y.o. female who presents for Medicare Annual (Subsequent) preventive examination.  Review of Systems:  N/A Cardiac Risk Factors include: advanced age (>5men, >6 women);dyslipidemia;hypertension;obesity (BMI >30kg/m2)     Objective:     Vitals: BP 122/64 (BP Location: Right Arm, Patient Position: Sitting, Cuff Size: Normal)   Pulse 60   Temp 98 F (36.7 C) (Oral)   Ht 5' 5.5" (1.664 m) Comment: no shoes  Wt 196 lb (88.9 kg)   SpO2 97%   BMI 32.12 kg/m   Body mass index is 32.12 kg/m.   Tobacco History  Smoking Status  . Former Smoker  . Packs/day: 0.50  . Years: 40.00  . Types: Cigarettes  . Quit date: 05/06/2001  Smokeless Tobacco  . Never Used     Counseling given: No   Past Medical History:  Diagnosis Date  . Adjustment disorder with anxiety   . Anginal pain ([place])   . Arthritis    "some in my hands" (11/11/2012)  . Atrial fibrillation ([place])   . Automatic implantable cardioverter-defibrillator in situ   . Blood in stool   . Carotid artery stenosis 09/2007   60-79% bilateral (stable)  . Carotid artery stenosis 10/2008   40-59% R 60-79%   . Chest pain, unspecified   . Chronic airway obstruction, not elsewhere classified   . Chronic bronchitis ([place])    "get it some; not q year" (11/11/2012)  . Chronic diastolic CHF (congestive heart failure) ([place]) 06/04/2013  . Chronic kidney disease, unspecified   . Coronary artery disease    non-obstructive by 2006 cath  . Dysuria   . Edema   . Encounter for therapeutic drug monitoring   . Exertional shortness of breath   . Frequent UTI    "get them a couple times/yr" (11/11/2012)  . Hemorrhage of rectum and anus   . High cholesterol   . History of blood transfusion 04/2011   "after hip OR" (11/11/2012)  . Hypertension 05/20/2011  . Hypertr obst cardiomyop   . Hypotension, unspecified    cardiac cath 2006..nonobstructive CAD 30-40s lesions.Marland KitchenETT 1/09 nondiagnostic due to poor HR  response..Right Renal Cancer 2003  . Long term (current) use of anticoagulants   . Malignant neoplasm of kidney, except pelvis   . Osteoarthritis of right hip   . Other acute sinusitis   . Other and unspecified coagulation defects   . Other malaise and fatigue   . Other screening mammogram   . PONV (postoperative nausea and vomiting)   . Pre-operative cardiovascular examination   . Renal cancer ([place]) 06/2001   Right  . Secondary cardiomyopathy, unspecified   . Sinus bradycardia   . Special screening for osteoporosis   . Urge incontinence    Past Surgical History:  Procedure Laterality Date  . ABDOMINAL HYSTERECTOMY  1975   for benign causes  . APPENDECTOMY    . BI-VENTRICULAR PACEMAKER UPGRADE  05/04/2010  . CARDIAC CATHETERIZATION  2006  . CATARACT EXTRACTION W/ INTRAOCULAR LENS  IMPLANT, BILATERAL  01/2006-02-2006  . CHOLECYSTECTOMY N/A 11/11/2012   Procedure: LAPAROSCOPIC CHOLECYSTECTOMY WITH INTRAOPERATIVE CHOLANGIOGRAM;  Surgeon: Imogene Burn. Georgette Dover, MD;  Location: [facility];  Service: General;  Laterality: N/A;  . EP IMPLANTABLE DEVICE N/A 02/21/2016   Procedure: ICD Generator Changeout;  Surgeon: Deboraha Sprang, MD;  Location: [facility];  Service: Cardiovascular;  Laterality: N/A;  . INSERT / REPLACE / REMOVE PACEMAKER  05-01-11   02-28-05-/05-04-10-ICD-MEDTRONIC MAXIMAL DR  .  JOINT REPLACEMENT    . LAPAROSCOPIC CHOLECYSTECTOMY  11/11/2012  . LAPAROSCOPIC LYSIS OF ADHESIONS N/A 11/11/2012   Procedure: LAPAROSCOPIC LYSIS OF ADHESIONS;  Surgeon: Imogene Burn. Georgette Dover, MD;  Location: [facility];  Service: General;  Laterality: N/A;  . NEPHRECTOMY Right 06/2001    S/P RENAL CELL CANCER  . TOTAL HIP ARTHROPLASTY Right 05/03/2011   Procedure: TOTAL HIP ARTHROPLASTY ANTERIOR APPROACH;  Surgeon: Mcarthur Rossetti;  [facility];  Service: Orthopedics;  Laterality: Right;  Removal of Cannulated Screws Right Hip, Right Direct Anterior Hip Replacement   Family History  Problem Relation  Age of Onset  . Heart failure Mother   . Breast cancer Maternal Aunt 82  . Breast cancer Cousin   . Colon cancer Neg Hx    History  Sexual Activity  . Sexual activity: No    Outpatient Encounter Prescriptions as of 03/19/2016  Medication Sig  . acetaminophen (TYLENOL) 500 MG tablet Take 1,000 mg by mouth every 6 (six) hours as needed for headache.  . carvedilol (COREG) 12.5 MG tablet Take 1 tablet (12.5 mg total) by mouth 2 (two) times daily with a meal.  . cetirizine (ZYRTEC) 10 MG tablet Take 10 mg by mouth daily.   . disopyramide (NORPACE) 150 MG capsule TAKE 1 CAPSULE BY MOUTH TWICE DAILY  . furosemide (LASIX) 40 MG tablet Take 1 tablet (40 mg total) by mouth 3 (three) times daily.  . rosuvastatin (CRESTOR) 40 MG tablet Take 1 tablet (40 mg total) by mouth daily.  . sildenafil (REVATIO) 20 MG tablet Take 1 tablet (20 mg total) by mouth 3 (three) times daily.  Marland Kitchen warfarin (COUMADIN) 2 MG tablet TAKE AS DIRECTED BY MOUTH ANTI-COAGULATION CLINIC  . [EXPIRED] cyanocobalamin ((VITAMIN B-12)) injection 1,000 mcg    No facility-administered encounter medications on file as of 03/19/2016.     Activities of Daily Living In your present state of health, do you have any difficulty performing the following activities: 03/19/2016 02/21/2016  Hearing? N N  Vision? N N  Difficulty concentrating or making decisions? N N  Walking or climbing stairs? N N  Dressing or bathing? N N  Doing errands, shopping? N -  Preparing Food and eating ? N -  Using the Toilet? N -  In the past six months, have you accidently leaked urine? Y -  Do you have problems with loss of bowel control? N -  Managing your Medications? N -  Managing your Finances? N -  Housekeeping or managing your Housekeeping? N -  Some recent data might be hidden    Patient Care Team: Lucille Passy, MD as PCP - General Minna Merritts, MD as Consulting Physician (Cardiology) Deboraha Sprang, MD as Consulting Physician  (Cardiology) Katha Cabal, MD as Consulting Physician (Vascular Surgery)    Assessment:     Hearing Screening   125Hz  250Hz  500Hz  1000Hz  2000Hz  3000Hz  4000Hz  6000Hz  8000Hz   Right ear:   40 40 40  0    Left ear:   0 40 40  0      Visual Acuity Screening   Right eye Left eye Both eyes  Without correction: 20/25-1 20/25-1 20/25-1  With correction:       Exercise Activities and Dietary recommendations Current Exercise Habits: The patient does not participate in regular exercise at present, Exercise limited by: cardiac condition(s)  Goals    . other          I will contact cardiologist or his nurse  to discuss parameters for daily fluid intake and daily weight.       Fall Risk Fall Risk  03/19/2016 03/06/2015  Falls in the past year? No No   Depression Screen PHQ 2/9 Scores 03/19/2016 03/06/2015  PHQ - 2 Score 0 0     Cognitive Function MMSE - Mini Mental State Exam 03/19/2016  Orientation to time 5  Orientation to Place 5  Registration 3  Attention/ Calculation 0  Recall 3  Language- name 2 objects 0  Language- repeat 1  Language- follow 3 step command 3  Language- read & follow direction 0  Write a sentence 0  Copy design 0  Total score 20       PLEASE NOTE: A Mini-Cog screen was completed. Maximum score is 20. A value of 0 denotes this part of Folstein MMSE was not completed or the patient failed this part of the Mini-Cog screening.   Mini-Cog Screening Orientation to Time - Max 5 pts Orientation to Place - Max 5 pts Registration - Max 3 pts Recall - Max 3 pts Language Repeat - Max 1 pts Language Follow 3 Step Command - Max 3 pts   Immunization History  Administered Date(s) Administered  . Influenza Split 03/21/2011, 02/14/2012  . Influenza Whole 04/10/2009, 04/17/2010  . Influenza,inj,Quad PF,36+ Mos 03/24/2013, 01/14/2014, 02/20/2015, 02/12/2016  . Pneumococcal Conjugate-13 03/06/2015  . Pneumococcal Polysaccharide-23 04/21/2001  . Td  01/25/2004  . Tdap 08/17/2010  . Zoster 04/10/2009   Screening Tests Health Maintenance  Topic Date Due  . MAMMOGRAM  03/14/2017 (Originally 03/15/2016)  . TETANUS/TDAP  08/16/2020  . INFLUENZA VACCINE  Completed  . DEXA SCAN  Completed  . ZOSTAVAX  Completed  . PNA vac Low Risk Adult  Completed      Plan:     I have personally reviewed and addressed the Medicare Annual Wellness questionnaire and have noted the following in the patient's chart:  A. Medical and social history B. Use of alcohol, tobacco or illicit drugs  C. Current medications and supplements D. Functional ability and status E.  Nutritional status F.  Physical activity G. Advance directives H. List of other physicians I.  Hospitalizations, surgeries, and ER visits in previous 12 months J.  [place] to include hearing, vision, cognitive, depression L. Referrals and appointments - none  In addition, I have reviewed and discussed with patient certain preventive protocols, quality metrics, and best practice recommendations. A written personalized care plan for preventive services as well as general preventive health recommendations were provided to patient.  See attached scanned questionnaire for additional information.   Signed,   Lindell Noe, MHA, BS, LPN Health Coach

## 2016-03-19 NOTE — Patient Instructions (Signed)
Ms. Torry , Thank you for taking time to come for your Medicare Wellness Visit. I appreciate your ongoing commitment to your health goals. Please review the following plan we discussed and let me know if I can assist you in the future.   These are the goals we discussed: Goals    . other          I will contact cardiologist or his nurse to discuss parameters for daily fluid intake and daily weight.        This is a list of the screening recommended for you and due dates:  Health Maintenance  Topic Date Due  . Mammogram  03/14/2017*  . Tetanus Vaccine  08/16/2020  . Flu Shot  Completed  . DEXA scan (bone density measurement)  Completed  . Shingles Vaccine  Completed  . Pneumonia vaccines  Completed  *Topic was postponed. The date shown is not the original due date.   Preventive Care for Adults  A healthy lifestyle and preventive care can promote health and wellness. Preventive health guidelines for adults include the following key practices.  . A routine yearly physical is a good way to check with your health care provider about your health and preventive screening. It is a chance to share any concerns and updates on your health and to receive a thorough exam.  . Visit your dentist for a routine exam and preventive care every 6 months. Brush your teeth twice a day and floss once a day. Good oral hygiene prevents tooth decay and gum disease.  . The frequency of eye exams is based on your age, health, family medical history, use  of contact lenses, and other factors. Follow your health care provider's ecommendations for frequency of eye exams.  . Eat a healthy diet. Foods like vegetables, fruits, whole grains, low-fat dairy products, and lean protein foods contain the nutrients you need without too many calories. Decrease your intake of foods high in solid fats, added sugars, and salt. Eat the right amount of calories for you. Get information about a proper diet from your health  care provider, if necessary.  . Regular physical exercise is one of the most important things you can do for your health. Most adults should get at least 150 minutes of moderate-intensity exercise (any activity that increases your heart rate and causes you to sweat) each week. In addition, most adults need muscle-strengthening exercises on 2 or more days a week.  Silver Sneakers may be a benefit available to you. To determine eligibility, you may visit the website: www.silversneakers.com or contact program at 228-337-6624 Mon-Fri between 8AM-8PM.   . Maintain a healthy weight. The body mass index (BMI) is a screening tool to identify possible weight problems. It provides an estimate of body fat based on height and weight. Your health care provider can find your BMI and can help you achieve or maintain a healthy weight.   For adults 20 years and older: ? A BMI below 18.5 is considered underweight. ? A BMI of 18.5 to 24.9 is normal. ? A BMI of 25 to 29.9 is considered overweight. ? A BMI of 30 and above is considered obese.   . Maintain normal blood lipids and cholesterol levels by exercising and minimizing your intake of saturated fat. Eat a balanced diet with plenty of fruit and vegetables. Blood tests for lipids and cholesterol should begin at age 67 and be repeated every 5 years. If your lipid or cholesterol levels are high, you are  over 41, or you are at high risk for heart disease, you may need your cholesterol levels checked more frequently. Ongoing high lipid and cholesterol levels should be treated with medicines if diet and exercise are not working.  . If you smoke, find out from your health care provider how to quit. If you do not use tobacco, please do not start.  . If you choose to drink alcohol, please do not consume more than 2 drinks per day. One drink is considered to be 12 ounces (355 mL) of beer, 5 ounces (148 mL) of wine, or 1.5 ounces (44 mL) of liquor.  . If you are 31-91  years old, ask your health care provider if you should take aspirin to prevent strokes.  . Use sunscreen. Apply sunscreen liberally and repeatedly throughout the day. You should seek shade when your shadow is shorter than you. Protect yourself by wearing long sleeves, pants, a wide-brimmed hat, and sunglasses year round, whenever you are outdoors.  . Once a month, do a whole body skin exam, using a mirror to look at the skin on your back. Tell your health care provider of new moles, moles that have irregular borders, moles that are larger than a pencil eraser, or moles that have changed in shape or color.

## 2016-03-26 ENCOUNTER — Ambulatory Visit (INDEPENDENT_AMBULATORY_CARE_PROVIDER_SITE_OTHER): Payer: PPO | Admitting: Family Medicine

## 2016-03-26 VITALS — BP 154/82 | HR 88 | Temp 97.5°F | Wt 196.0 lb

## 2016-03-26 DIAGNOSIS — E538 Deficiency of other specified B group vitamins: Secondary | ICD-10-CM

## 2016-03-26 DIAGNOSIS — C649 Malignant neoplasm of unspecified kidney, except renal pelvis: Secondary | ICD-10-CM

## 2016-03-26 DIAGNOSIS — Z01419 Encounter for gynecological examination (general) (routine) without abnormal findings: Secondary | ICD-10-CM

## 2016-03-26 DIAGNOSIS — Z0000 Encounter for general adult medical examination without abnormal findings: Secondary | ICD-10-CM | POA: Diagnosis not present

## 2016-03-26 DIAGNOSIS — I4891 Unspecified atrial fibrillation: Secondary | ICD-10-CM

## 2016-03-26 DIAGNOSIS — I495 Sick sinus syndrome: Secondary | ICD-10-CM

## 2016-03-26 DIAGNOSIS — N184 Chronic kidney disease, stage 4 (severe): Secondary | ICD-10-CM

## 2016-03-26 DIAGNOSIS — I5032 Chronic diastolic (congestive) heart failure: Secondary | ICD-10-CM

## 2016-03-26 DIAGNOSIS — E7800 Pure hypercholesterolemia, unspecified: Secondary | ICD-10-CM

## 2016-03-26 DIAGNOSIS — I10 Essential (primary) hypertension: Secondary | ICD-10-CM

## 2016-03-26 NOTE — Assessment & Plan Note (Signed)
Reviewed preventive care protocols, scheduled due services, and updated immunizations Discussed nutrition, exercise, diet, and healthy lifestyle.  

## 2016-03-26 NOTE — Assessment & Plan Note (Signed)
Cr has ben relatively stable s/p nephrectomy.

## 2016-03-26 NOTE — Assessment & Plan Note (Addendum)
Slighty elevated today but has been under food control. No changes made today. BP Readings from Last 3 Encounters:  03/26/16 (!) 154/82  03/19/16 122/64  03/06/16 114/68

## 2016-03-26 NOTE — Progress Notes (Signed)
Pre visit review using our clinic review tool, if applicable. No additional management support is needed unless otherwise documented below in the visit note. 

## 2016-03-26 NOTE — Assessment & Plan Note (Signed)
S/p device change.   Doing better this week.

## 2016-03-26 NOTE — Assessment & Plan Note (Signed)
Well controlled on current dose of crestor. Also followed by cardiology. No changes made today.

## 2016-03-26 NOTE — Assessment & Plan Note (Signed)
No changes made today Followed by cardiology. NSR today On coumadin.

## 2016-03-26 NOTE — Patient Instructions (Signed)
Great to see you. Happy Holidays and Happy birthday!  You can schedule your mammogram at your convenience.

## 2016-03-26 NOTE — Progress Notes (Signed)
Subjective:   Patient ID: [name], female    DOB: 11-03-1933, 82 y.o.   MRN: [id number]  [name] is a pleasant 82 y.o. year old female who presents to clinic today with Annual Exam  and follow up of chronic medical conditions on 03/26/2016  HPI: [name] is an 82 year-old woman with a history of syncope, COPD, CAD,carotid stenosis, afib on coumadin, CDK.  Annual medicare wellness visit with Candis Musa, RN on 03/19/16.  Note reviewed.   Chronic intermittent CP, afib on coumadin, bilateral carotid stenosis, and symptomatic bradycardia - followed by Dr. Rockey Situ and Dr. Caryl Comes. Last saw Dr. Rockey Situ on 03/05/16- note reviewed. Recent device change by Dr. Caryl Comes. Still having some SOB.  No changes made to rxs.    Carotid dopplers yearly, continue Lipitor 40 mg daily, lasix 40 mg twice daily, Revatio and coumadin  HLD-  Lab Results  Component Value Date   CHOL 179 03/19/2016   HDL 39.10 03/19/2016   LDLCALC 118 (H) 03/19/2016   LDLDIRECT 121.0 03/06/2015   TRIG 110.0 03/19/2016   CHOLHDL 5 03/19/2016   Lab Results  Component Value Date   INR 2.0 03/11/2016   INR 2.3 02/26/2016   INR 2.00 02/21/2016    PVD- sees Dr. Ronalee Belts.  CKD s/p nephrectomy due to renal cell CA- followed by renal. Dr.Singh-  Cr has been stable.  Lab Results  Component Value Date   CREATININE 1.59 (H) 03/19/2016     Lab Results  Component Value Date   WBC 3.6 (L) 03/19/2016   HGB 10.3 (L) 03/19/2016   HCT 31.9 (L) 03/19/2016   MCV 78.4 03/19/2016   PLT 206.0 03/19/2016   Lab Results  Component Value Date   TSH 2.13 03/19/2016   Current Outpatient Prescriptions on File Prior to Visit  Medication Sig Dispense Refill  . acetaminophen (TYLENOL) 500 MG tablet Take 1,000 mg by mouth every 6 (six) hours as needed for headache.    . carvedilol (COREG) 12.5 MG tablet Take 1 tablet (12.5 mg total) by mouth 2 (two) times daily with a meal. 60 tablet 10  . cetirizine  (ZYRTEC) 10 MG tablet Take 10 mg by mouth daily.     . disopyramide (NORPACE) 150 MG capsule TAKE 1 CAPSULE BY MOUTH TWICE DAILY 60 capsule 6  . furosemide (LASIX) 40 MG tablet Take 1 tablet (40 mg total) by mouth 3 (three) times daily. 90 tablet 10  . rosuvastatin (CRESTOR) 40 MG tablet Take 1 tablet (40 mg total) by mouth daily. 90 tablet 3  . sildenafil (REVATIO) 20 MG tablet Take 1 tablet (20 mg total) by mouth 3 (three) times daily. 90 tablet 6  . warfarin (COUMADIN) 2 MG tablet TAKE AS DIRECTED BY MOUTH ANTI-COAGULATION CLINIC 120 tablet 3  . [DISCONTINUED] hydrochlorothiazide (HYDRODIURIL) 25 MG tablet Take 25 mg by mouth daily after breakfast.      No current facility-administered medications on file prior to visit.     Allergies  Allergen Reactions  . Codeine Nausea And Vomiting  . Morphine And Related Nausea And Vomiting  . Sulfonamide Derivatives Other (See Comments)    REACTION: Dry mouth    Past Medical History:  Diagnosis Date  . Adjustment disorder with anxiety   . Anginal pain ([place])   . Arthritis    "some in my hands" (11/11/2012)  . Atrial fibrillation ([place])   . Automatic implantable cardioverter-defibrillator in situ   . Blood in stool   . Carotid  artery stenosis 09/2007   60-79% bilateral (stable)  . Carotid artery stenosis 10/2008   40-59% R 60-79%   . Chest pain, unspecified   . Chronic airway obstruction, not elsewhere classified   . Chronic bronchitis ([place])    "get it some; not q year" (11/11/2012)  . Chronic diastolic CHF (congestive heart failure) ([place]) 06/04/2013  . Chronic kidney disease, unspecified   . Coronary artery disease    non-obstructive by 2006 cath  . Dysuria   . Edema   . Encounter for therapeutic drug monitoring   . Exertional shortness of breath   . Frequent UTI    "get them a couple times/yr" (11/11/2012)  . Hemorrhage of rectum and anus   . High cholesterol   . History of blood transfusion 04/2011   "after hip OR" (11/11/2012)  .  Hypertension 05/20/2011  . Hypertr obst cardiomyop   . Hypotension, unspecified    cardiac cath 2006..nonobstructive CAD 30-40s lesions.Marland KitchenETT 1/09 nondiagnostic due to poor HR response..Right Renal Cancer 2003  . Long term (current) use of anticoagulants   . Malignant neoplasm of kidney, except pelvis   . Osteoarthritis of right hip   . Other acute sinusitis   . Other and unspecified coagulation defects   . Other malaise and fatigue   . Other screening mammogram   . PONV (postoperative nausea and vomiting)   . Pre-operative cardiovascular examination   . Renal cancer ([place]) 06/2001   Right  . Secondary cardiomyopathy, unspecified   . Sinus bradycardia   . Special screening for osteoporosis   . Urge incontinence     Past Surgical History:  Procedure Laterality Date  . ABDOMINAL HYSTERECTOMY  1975   for benign causes  . APPENDECTOMY    . BI-VENTRICULAR PACEMAKER UPGRADE  05/04/2010  . CARDIAC CATHETERIZATION  2006  . CATARACT EXTRACTION W/ INTRAOCULAR LENS  IMPLANT, BILATERAL  01/2006-02-2006  . CHOLECYSTECTOMY N/A 11/11/2012   Procedure: LAPAROSCOPIC CHOLECYSTECTOMY WITH INTRAOPERATIVE CHOLANGIOGRAM;  Surgeon: Imogene Burn. Georgette Dover, MD;  Location: [facility];  Service: General;  Laterality: N/A;  . EP IMPLANTABLE DEVICE N/A 02/21/2016   Procedure: ICD Generator Changeout;  Surgeon: Deboraha Sprang, MD;  Location: [facility];  Service: Cardiovascular;  Laterality: N/A;  . INSERT / REPLACE / REMOVE PACEMAKER  05-01-11   02-28-05-/05-04-10-ICD-MEDTRONIC MAXIMAL DR  . JOINT REPLACEMENT    . LAPAROSCOPIC CHOLECYSTECTOMY  11/11/2012  . LAPAROSCOPIC LYSIS OF ADHESIONS N/A 11/11/2012   Procedure: LAPAROSCOPIC LYSIS OF ADHESIONS;  Surgeon: Imogene Burn. Georgette Dover, MD;  Location: [facility];  Service: General;  Laterality: N/A;  . NEPHRECTOMY Right 06/2001    S/P RENAL CELL CANCER  . TOTAL HIP ARTHROPLASTY Right 05/03/2011   Procedure: TOTAL HIP ARTHROPLASTY ANTERIOR APPROACH;  Surgeon: Mcarthur Rossetti;   Location[facility];  Service: Orthopedics;  Laterality: Right;  Removal of Cannulated Screws Right Hip, Right Direct Anterior Hip Replacement    Family History  Problem Relation Age of Onset  . Heart failure Mother   . Breast cancer Maternal Aunt 82  . Breast cancer Cousin   . Colon cancer Neg Hx     Social History   Social History  . Marital status: Widowed    Spouse name: N/A  . Number of children: N/A  . Years of education: N/A   Occupational History  . Retired Retired   Social History Main Topics  . Smoking status: Former Smoker    Packs/day: 0.50    Years: 40.00    Types:  Cigarettes    Quit date: 05/06/2001  . Smokeless tobacco: Never Used  . Alcohol use No  . Drug use: No  . Sexual activity: No   Other Topics Concern  . 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   ALCOHOL USE-NO   RETIRED   FORMER TOBACCO USE.....1/2 PACK X 50 YRS UNTIL 2003   Would desire CPR   The PMH, PSH, Social History, Family History, Medications, and allergies have been reviewed in [facility], and have been updated if relevant.  Review of Systems  Constitutional: Negative.   HENT: Negative.   Eyes: Negative.   Respiratory: Positive for shortness of breath.   Cardiovascular: Negative for chest pain.  Gastrointestinal: Negative.   Endocrine: Negative.   Genitourinary: Negative for dysuria, enuresis, flank pain, frequency and genital sores.  Musculoskeletal: Negative.   Allergic/Immunologic: Negative.   Neurological: Negative.   Hematological: Negative.       Objective:    BP (!) 154/82   Pulse 88   Temp 97.5 F (36.4 C) (Oral)   Wt 196 lb (88.9 kg)   BMI 32.12 kg/m   BP Readings from Last 3 Encounters:  03/26/16 (!) 154/82  03/19/16 122/64  03/06/16 114/68     Physical Exam  Constitutional: She is oriented to  person, place, and time. She appears well-developed and well-nourished. No distress.  HENT:  Head: Normocephalic and atraumatic.  Eyes: Conjunctivae are normal.  Neck: Neck supple.  Cardiovascular: Normal rate and regular rhythm.   Murmur heard. Pulmonary/Chest: Effort normal and breath sounds normal. No respiratory distress.  Abdominal: Soft.  Musculoskeletal: Normal range of motion. She exhibits no edema.  Neurological: She is alert and oriented to person, place, and time. No cranial nerve deficit.  Skin: Skin is warm and dry.  Psychiatric: She has a normal mood and affect. Her behavior is normal. Judgment and thought content normal.  Nursing note and vitals reviewed.         Assessment & Plan:   Atrial fibrillation, unspecified type ([place])  Well woman exam  Chronic diastolic CHF (congestive heart failure) ([facility])  Essential hypertension  Chronic kidney disease (CKD), stage IV (severe) ([facility])  Renal cell carcinoma, unspecified laterality ([place])  B12 deficiency  Pure hypercholesterolemia No Follow-up on file.

## 2016-04-05 ENCOUNTER — Ambulatory Visit (INDEPENDENT_AMBULATORY_CARE_PROVIDER_SITE_OTHER): Payer: PPO

## 2016-04-05 DIAGNOSIS — Z7902 Long term (current) use of antithrombotics/antiplatelets: Secondary | ICD-10-CM

## 2016-04-05 LAB — POCT INR: INR: 2.4

## 2016-04-05 NOTE — Patient Instructions (Signed)
Pre visit review using our clinic review tool, if applicable. No additional management support is needed unless otherwise documented below in the visit note. 

## 2016-04-23 ENCOUNTER — Ambulatory Visit (INDEPENDENT_AMBULATORY_CARE_PROVIDER_SITE_OTHER): Payer: PPO

## 2016-04-23 DIAGNOSIS — E538 Deficiency of other specified B group vitamins: Secondary | ICD-10-CM | POA: Diagnosis not present

## 2016-04-23 MED ORDER — CYANOCOBALAMIN 1000 MCG/ML IJ SOLN
1000.0000 ug | Freq: Once | INTRAMUSCULAR | Status: AC
  Administered 2016-04-23: 1000 ug via INTRAMUSCULAR

## 2016-04-30 ENCOUNTER — Telehealth: Payer: Self-pay | Admitting: Family Medicine

## 2016-04-30 ENCOUNTER — Emergency Department (HOSPITAL_COMMUNITY)
Admission: EM | Admit: 2016-04-30 | Discharge: 2016-04-30 | Disposition: A | Discharge status: Home / Self Care | Payer: PPO | Attending: Emergency Medicine | Admitting: Emergency Medicine

## 2016-04-30 ENCOUNTER — Emergency Department (HOSPITAL_COMMUNITY): Payer: PPO

## 2016-04-30 ENCOUNTER — Other Ambulatory Visit: Payer: Self-pay

## 2016-04-30 DIAGNOSIS — Z95810 Presence of automatic (implantable) cardiac defibrillator: Secondary | ICD-10-CM | POA: Insufficient documentation

## 2016-04-30 DIAGNOSIS — N189 Chronic kidney disease, unspecified: Secondary | ICD-10-CM | POA: Insufficient documentation

## 2016-04-30 DIAGNOSIS — Z96641 Presence of right artificial hip joint: Secondary | ICD-10-CM | POA: Insufficient documentation

## 2016-04-30 DIAGNOSIS — J449 Chronic obstructive pulmonary disease, unspecified: Secondary | ICD-10-CM | POA: Insufficient documentation

## 2016-04-30 DIAGNOSIS — Z87891 Personal history of nicotine dependence: Secondary | ICD-10-CM | POA: Insufficient documentation

## 2016-04-30 DIAGNOSIS — Z5321 Procedure and treatment not carried out due to patient leaving prior to being seen by health care provider: Secondary | ICD-10-CM | POA: Insufficient documentation

## 2016-04-30 DIAGNOSIS — I130 Hypertensive heart and chronic kidney disease with heart failure and stage 1 through stage 4 chronic kidney disease, or unspecified chronic kidney disease: Secondary | ICD-10-CM | POA: Diagnosis not present

## 2016-04-30 DIAGNOSIS — R55 Syncope and collapse: Secondary | ICD-10-CM | POA: Insufficient documentation

## 2016-04-30 DIAGNOSIS — I5032 Chronic diastolic (congestive) heart failure: Secondary | ICD-10-CM | POA: Insufficient documentation

## 2016-04-30 DIAGNOSIS — I2510 Atherosclerotic heart disease of native coronary artery without angina pectoris: Secondary | ICD-10-CM | POA: Diagnosis not present

## 2016-04-30 DIAGNOSIS — R079 Chest pain, unspecified: Secondary | ICD-10-CM | POA: Diagnosis not present

## 2016-04-30 DIAGNOSIS — R0602 Shortness of breath: Secondary | ICD-10-CM | POA: Diagnosis not present

## 2016-04-30 DIAGNOSIS — Z7901 Long term (current) use of anticoagulants: Secondary | ICD-10-CM | POA: Diagnosis not present

## 2016-04-30 DIAGNOSIS — Z85528 Personal history of other malignant neoplasm of kidney: Secondary | ICD-10-CM | POA: Insufficient documentation

## 2016-04-30 LAB — CBC WITH DIFFERENTIAL/PLATELET
Basophils Absolute: 0 10*3/uL (ref 0.0–0.1)
Basophils Relative: 0 %
Eosinophils Absolute: 0.1 10*3/uL (ref 0.0–0.7)
Eosinophils Relative: 1 %
HCT: 31.5 % — ABNORMAL LOW (ref 36.0–46.0)
Hemoglobin: 9.5 g/dL — ABNORMAL LOW (ref 12.0–15.0)
Lymphocytes Relative: 19 %
Lymphs Abs: 1 10*3/uL (ref 0.7–4.0)
MCH: 23.8 pg — ABNORMAL LOW (ref 26.0–34.0)
MCHC: 30.2 g/dL (ref 30.0–36.0)
MCV: 78.9 fL (ref 78.0–100.0)
Monocytes Absolute: 0.5 10*3/uL (ref 0.1–1.0)
Monocytes Relative: 9 %
Neutro Abs: 3.8 10*3/uL (ref 1.7–7.7)
Neutrophils Relative %: 71 %
Platelets: 199 10*3/uL (ref 150–400)
RBC: 3.99 MIL/uL (ref 3.87–5.11)
RDW: 17.3 % — ABNORMAL HIGH (ref 11.5–15.5)
WBC: 5.4 10*3/uL (ref 4.0–10.5)

## 2016-04-30 LAB — BASIC METABOLIC PANEL
Anion gap: 10 (ref 5–15)
BUN: 19 mg/dL (ref 6–20)
CO2: 26 mmol/L (ref 22–32)
Calcium: 9 mg/dL (ref 8.9–10.3)
Chloride: 103 mmol/L (ref 101–111)
Creatinine, Ser: 1.63 mg/dL — ABNORMAL HIGH (ref 0.44–1.00)
GFR calc Af Amer: 33 mL/min — ABNORMAL LOW (ref >60)
GFR calc non Af Amer: 29 mL/min — ABNORMAL LOW (ref >60)
Glucose, Bld: 83 mg/dL (ref 65–99)
Potassium: 3.5 mmol/L (ref 3.5–5.1)
Sodium: 139 mmol/L (ref 135–145)

## 2016-04-30 LAB — I-STAT TROPONIN, ED: Troponin i, poc: 0 ng/mL (ref 0.00–0.08)

## 2016-04-30 NOTE — Telephone Encounter (Signed)
I spoke with pt and she has decided she does need to go to [facility] ED. Pt is trying to get worked out to go to [facility] now. FYI to Dr Deborra Medina.

## 2016-04-30 NOTE — Telephone Encounter (Signed)
Patient Name: [name]  DOB: 03-Jul-1933    Initial Comment Caller says when she bends over, it feel like everything cuts off, she is afraid she will blackout if she bends over.    Nurse Assessment  Nurse: Thad Ranger RN, Denise Date/Time (Eastern Time): 04/30/2016 1:11:05 PM  Confirm and document reason for call. If symptomatic, describe symptoms. ---Pt states she has pressure in her head when she bends over w/some nasal congestion.  Does the patient have any new or worsening symptoms? ---Yes  Will a triage be completed? ---Yes  Related visit to physician within the last 2 weeks? ---No  Does the PT have any chronic conditions? (i.e. diabetes, asthma, etc.) ---Yes  List chronic conditions. ---Pacemaker, CAD, Kidney removal due to CA.  Is this a behavioral health or substance abuse call? ---No     Guidelines    Guideline Title Affirmed Question Affirmed Notes  Sinus Pain or Congestion [1] Difficulty breathing AND [2] not from stuffy nose (e.g., not relieved by cleaning out the nose)    Final Disposition User   Go to ED Now [name], RN, Langley Gauss    Comments  Reinforced need to be seen in ER due to Pacer replacement recent w/ SOB. Pt cont to refuse   Referrals  GO TO FACILITY REFUSED   Disagree/Comply: Disagree  Disagree/Comply Reason: Disagree with instructions

## 2016-04-30 NOTE — ED Triage Notes (Addendum)
Pt reports chest pain for 3-4 days with shortness of breath. Pt reports SOB all the time, but worsening past couple days. PT reports pain is to left breast. Pt reports also feelings of near syncope and pressure to head when going from sitting to standing. Pt has cardiac hx with pacemaker placement in October.

## 2016-05-03 ENCOUNTER — Ambulatory Visit (INDEPENDENT_AMBULATORY_CARE_PROVIDER_SITE_OTHER): Payer: PPO | Admitting: Family Medicine

## 2016-05-03 VITALS — BP 118/80 | HR 89 | Wt 196.8 lb

## 2016-05-03 DIAGNOSIS — R300 Dysuria: Secondary | ICD-10-CM | POA: Diagnosis not present

## 2016-05-03 DIAGNOSIS — R5383 Other fatigue: Secondary | ICD-10-CM

## 2016-05-03 DIAGNOSIS — R0981 Nasal congestion: Secondary | ICD-10-CM | POA: Diagnosis not present

## 2016-05-03 DIAGNOSIS — R42 Dizziness and giddiness: Secondary | ICD-10-CM

## 2016-05-03 DIAGNOSIS — R5381 Other malaise: Secondary | ICD-10-CM | POA: Diagnosis not present

## 2016-05-03 LAB — POC URINALSYSI DIPSTICK (AUTOMATED)
Bilirubin, UA: NEGATIVE
Glucose, UA: NEGATIVE
Ketones, UA: NEGATIVE
Leukocytes, UA: NEGATIVE
Nitrite, UA: NEGATIVE
Protein, UA: NEGATIVE
Spec Grav, UA: 1.015
Urobilinogen, UA: NEGATIVE
pH, UA: 6

## 2016-05-03 MED ORDER — FLUTICASONE PROPIONATE 50 MCG/ACT NA SUSP: 2.0000 | Freq: Every day | NASAL | 6 refills | Status: DC

## 2016-05-03 NOTE — Patient Instructions (Addendum)
I have sent in a prescription for generic flonase, use 2 sprays in each nostril at night Please get some over the counter Claritin (generic is loratadine) If you are not feeling better in a couple of days, please let us known  Drink enough water.

## 2016-05-03 NOTE — Progress Notes (Signed)
Subjective:    Patient ID: [name], female    DOB: 03-24-1934, 82 y.o.   MRN: [id number]  HPI This is an 82 yo female who presents today for follow up from ER visit 3 days ago (04/30/16). She presented with dyspnea and left prior to being seen. Labs were drawn and CXR done (negatve). Had pacemaker  changed out 10/18/17and has not felt well since. Breathing worse with moving around, ok with sitting. Feels a little disoriented this morning, reports that this is not unusual for her. Head feels heavy if she bends over and she has pressure in her ears, feels light headed. Has had sinus drainage and pressure for awhile. This was happening in the summer as well. Spoke with Dr. Rockey Situ about this 03/05/16, has been taking sildenafil for pulmonary HTN which may be helping some. Has swelling of legs, takes furosemide 2 times daily, can take an extra if needed but makes her feel "washed out."   Has had intermittent dysuria, no fever, no nausea/vomiting, no flank pain, frequency with furosemide unchanged.   Has a lot of sinus pressure. Takes Zyrtec daily and has for a long time.   Past Medical History:  Diagnosis Date  . Adjustment disorder with anxiety   . Anginal pain ([place])   . Arthritis    "some in my hands" (11/11/2012)  . Atrial fibrillation ([place])   . Automatic implantable cardioverter-defibrillator in situ   . Blood in stool   . Carotid artery stenosis 09/2007   60-79% bilateral (stable)  . Carotid artery stenosis 10/2008   40-59% R 60-79%   . Chest pain, unspecified   . Chronic airway obstruction, not elsewhere classified   . Chronic bronchitis ([place])    "get it some; not q year" (11/11/2012)  . Chronic diastolic CHF (congestive heart failure) ([place]) 06/04/2013  . Chronic kidney disease, unspecified   . Coronary artery disease    non-obstructive by 2006 cath  . Dysuria   . Edema   . Encounter for therapeutic drug monitoring   . Exertional shortness of breath   . Frequent UTI    "get  them a couple times/yr" (11/11/2012)  . Hemorrhage of rectum and anus   . High cholesterol   . History of blood transfusion 04/2011   "after hip OR" (11/11/2012)  . Hypertension 05/20/2011  . Hypertr obst cardiomyop   . Hypotension, unspecified    cardiac cath 2006..nonobstructive CAD 30-40s lesions.Marland KitchenETT 1/09 nondiagnostic due to poor HR response..Right Renal Cancer 2003  . Long term (current) use of anticoagulants   . Malignant neoplasm of kidney, except pelvis   . Osteoarthritis of right hip   . Other acute sinusitis   . Other and unspecified coagulation defects   . Other malaise and fatigue   . Other screening mammogram   . PONV (postoperative nausea and vomiting)   . Pre-operative cardiovascular examination   . Renal cancer ([place]) 06/2001   Right  . Secondary cardiomyopathy, unspecified   . Sinus bradycardia   . Special screening for osteoporosis   . Urge incontinence    Past Surgical History:  Procedure Laterality Date  . ABDOMINAL HYSTERECTOMY  1975   for benign causes  . APPENDECTOMY    . BI-VENTRICULAR PACEMAKER UPGRADE  05/04/2010  . CARDIAC CATHETERIZATION  2006  . CATARACT EXTRACTION W/ INTRAOCULAR LENS  IMPLANT, BILATERAL  01/2006-02-2006  . CHOLECYSTECTOMY N/A 11/11/2012   Procedure: LAPAROSCOPIC CHOLECYSTECTOMY WITH INTRAOPERATIVE CHOLANGIOGRAM;  Surgeon: Imogene Burn. Georgette Dover, MD;  Location: [facility]  [facility];  Service: General;  Laterality: N/A;  . EP IMPLANTABLE DEVICE N/A 02/21/2016   Procedure: ICD Generator Changeout;  Surgeon: Deboraha Sprang, MD;  Location: [place] [facility];  Service: Cardiovascular;  Laterality: N/A;  . INSERT / REPLACE / REMOVE PACEMAKER  05-01-11   02-28-05-/05-04-10-ICD-MEDTRONIC MAXIMAL DR  . JOINT REPLACEMENT    . LAPAROSCOPIC CHOLECYSTECTOMY  11/11/2012  . LAPAROSCOPIC LYSIS OF ADHESIONS N/A 11/11/2012   Procedure: LAPAROSCOPIC LYSIS OF ADHESIONS;  Surgeon: Imogene Burn. Georgette Dover, MD;  Location: [facility];  Service: General;  Laterality: N/A;  . NEPHRECTOMY Right  06/2001    S/P RENAL CELL CANCER  . TOTAL HIP ARTHROPLASTY Right 05/03/2011   Procedure: TOTAL HIP ARTHROPLASTY ANTERIOR APPROACH;  Surgeon: Mcarthur Rossetti;  Location: [facility];  Service: Orthopedics;  Laterality: Right;  Removal of Cannulated Screws Right Hip, Right Direct Anterior Hip Replacement   Family History  Problem Relation Age of Onset  . Heart failure Mother   . Breast cancer Maternal Aunt 82  . Breast cancer Cousin   . Colon cancer Neg Hx    Social History  Substance Use Topics  . Smoking status: Former Smoker    Packs/day: 0.50    Years: 40.00    Types: Cigarettes    Quit date: 05/06/2001  . Smokeless tobacco: Never Used  . Alcohol use No     Review of Systems Per HPI    Objective:   Physical Exam  Constitutional: She is oriented to person, place, and time. She appears well-developed and well-nourished. No distress.  HENT:  Head: Normocephalic and atraumatic.  Right Ear: Tympanic membrane, external ear and ear canal normal.  Left Ear: Tympanic membrane, external ear and ear canal normal.  Nose: Mucosal edema and rhinorrhea present.  Mouth/Throat: Oropharynx is clear and moist.  Eyes: Conjunctivae are normal.  Cardiovascular: Normal rate, regular rhythm and normal heart sounds.   Pulmonary/Chest: Effort normal and breath sounds normal.  Musculoskeletal: She exhibits edema (1+ pretibial).  Neurological: She is alert and oriented to person, place, and time.  Skin: Skin is warm and dry. She is not diaphoretic.  Psychiatric: She has a normal mood and affect. Her behavior is normal. Judgment and thought content normal.  Vitals reviewed.     BP 118/80   Pulse 89   Wt 196 lb 12.8 oz (89.3 kg)   SpO2 98%   BMI 32.25 kg/m  Wt Readings from Last 3 Encounters:  05/03/16 196 lb 12.8 oz (89.3 kg)  03/26/16 196 lb (88.9 kg)  03/19/16 196 lb (88.9 kg)   Results for orders placed or performed in visit on 05/03/16  POCT Urinalysis Dipstick (Automated)    Result Value Ref Range   Color, UA yellow    Clarity, UA clear    Glucose, UA neg    Bilirubin, UA neg    Ketones, UA neg    Spec Grav, UA 1.015    Blood, UA moderate    pH, UA 6.0    Protein, UA neg    Urobilinogen, UA negative    Nitrite, UA neg    Leukocytes, UA Negative Negative       Assessment & Plan:  1. Dysuria - POCT Urinalysis Dipstick (Automated)- urine dipstick negative except for blood which is a chronic finding for her (on coumadin)  2. Nasal congestion - not sure if her head fullness and nasal drainage are contributing to her sense of light headedness with bending over, may need different antihistamine - fluticasone (  FLONASE) 50 MCG/ACT nasal spray; Place 2 sprays into both nostrils daily.  Dispense: 16 g; Refill: 6  3. Malaise and fatigue - she has very non specific symptoms that mentioned in previous office visits - reviewed recent labs and CXR with patient and discussed her EF and carotid blockages - not sure why symptoms have gotten worse since pacemaker replacement in 10/17  4. Positional lightheadedness - she notices this with bending over and getting into cabinets, not sure if it is related to medications or pacemaker limiting compensatory rate increase or if related to sinus issues - discussed possible causes with patient and reassured with recent work up at ER and no worrisome physical findings today  - She has an appointment in about 10 days with cardiology and will discuss any persistent symptoms at that visit. She was instructed to RTC/ER if worsening symptoms.    Clarene Reamer, FNP-BC  [facility]  05/03/2016 5:32 PM

## 2016-05-10 ENCOUNTER — Ambulatory Visit: Payer: PPO

## 2016-05-14 ENCOUNTER — Encounter: Payer: Self-pay | Admitting: Internal Medicine

## 2016-05-14 ENCOUNTER — Ambulatory Visit (INDEPENDENT_AMBULATORY_CARE_PROVIDER_SITE_OTHER): Payer: PPO | Admitting: Internal Medicine

## 2016-05-14 VITALS — BP 130/70 | HR 72 | Ht 66.5 in | Wt 193.0 lb

## 2016-05-14 DIAGNOSIS — Z95810 Presence of automatic (implantable) cardiac defibrillator: Secondary | ICD-10-CM | POA: Diagnosis not present

## 2016-05-14 DIAGNOSIS — I480 Paroxysmal atrial fibrillation: Secondary | ICD-10-CM | POA: Diagnosis not present

## 2016-05-14 DIAGNOSIS — I5030 Unspecified diastolic (congestive) heart failure: Secondary | ICD-10-CM

## 2016-05-14 DIAGNOSIS — I428 Other cardiomyopathies: Secondary | ICD-10-CM

## 2016-05-14 DIAGNOSIS — I495 Sick sinus syndrome: Secondary | ICD-10-CM | POA: Diagnosis not present

## 2016-05-14 DIAGNOSIS — Z79899 Other long term (current) drug therapy: Secondary | ICD-10-CM

## 2016-05-14 LAB — CUP PACEART INCLINIC DEVICE CHECK
Battery Remaining Longevity: 91 mo
Battery Voltage: 3.05 V
Brady Statistic AP VP Percent: 98.69 %
Brady Statistic AP VS Percent: 0 %
Brady Statistic AS VP Percent: 1.3 %
Brady Statistic AS VS Percent: 0 %
Brady Statistic RA Percent Paced: 98.63 %
Brady Statistic RV Percent Paced: 99.96 %
Date Time Interrogation Session: 20180109150007
HighPow Impedance: 32 Ohm
HighPow Impedance: 37 Ohm
Implantable Lead Implant Date: 20061026
Implantable Lead Implant Date: 20111230
Implantable Lead Location: 753859
Implantable Lead Location: 753860
Implantable Lead Model: 5076
Implantable Lead Model: 6947
Implantable Pulse Generator Implant Date: 20171018
Lead Channel Impedance Value: 342 Ohm
Lead Channel Impedance Value: 418 Ohm
Lead Channel Impedance Value: 418 Ohm
Lead Channel Pacing Threshold Amplitude: 0.75 V
Lead Channel Pacing Threshold Amplitude: 1.25 V
Lead Channel Pacing Threshold Pulse Width: 0.4 ms
Lead Channel Pacing Threshold Pulse Width: 0.4 ms
Lead Channel Setting Pacing Amplitude: 2.5 V
Lead Channel Setting Pacing Amplitude: 2.5 V
Lead Channel Setting Pacing Pulse Width: 0.4 ms
Lead Channel Setting Sensing Sensitivity: 0.3 mV

## 2016-05-14 NOTE — Progress Notes (Signed)
Patient Care Team: Lucille Passy, MD as PCP - General Minna Merritts, MD as Consulting Physician (Cardiology) Deboraha Sprang, MD as Consulting Physician (Cardiology) Katha Cabal, MD as Consulting Physician (Vascular Surgery)   HPI  [name] is a 83 y.o. female Seen in followup for nonischemic cardiomyopathy for which she has a primary prevention ICD implanted initially 2006 with generator replacement about 5 years ago and replacement again 10/17   There had been puffiness and concern re infection   cultures at change out were neg     She has paroxysmal atrial fibrillation; previously  treated with disopyramide changed to  flecainide. But 2/2   side effects  back on disopyramide. ( the d/c summary 4/16 was WRONG-describing the use of amiodarone)  She's had infrequent Afib  She has a CHADS-VASc score of greater than or equal to 6 (age-35, gender-1, TIA-2 (4/16), heart failure-1)  Echocardiogram 2015 demonstrated normal LV function moderate-severe MR moderate TR.   Echo 4/16 was reviewed and was similar with RV enlargement and PA pressure of 75-mod severe pulm HTN Echo    DATE TEST    1/15    Echo   EF 65 % Mod Pulm HTN, TR and Mild MR   4/16    Echo   EF 65 % Mod-sev PulmHTN with RVE, Mod MR severe TR  6/17 Echio EF 65% Mild-mod MR     Cath 2006 without obstructive disease    Date      3/17    Cr  1.3      K   4.9     12/17    Cr  1.63      K   3.5 Hgb 9.5    She has not felt well since the device was replaced   She has struggled with dyspnea and edema  Her fluid intake is copious, > 1/2--3/4 gallon/day with lots of salt;  Good response to diuretics      Past Medical History:  Diagnosis Date  . Adjustment disorder with anxiety   . Anginal pain ([place])   . Arthritis    "some in my hands" (11/11/2012)  . Atrial fibrillation ([place])   . Automatic implantable cardioverter-defibrillator in situ   . Blood in stool   . Carotid artery stenosis 09/2007   60-79% bilateral (stable)  . Carotid artery stenosis 10/2008   40-59% R 60-79%   . Chest pain, unspecified   . Chronic airway obstruction, not elsewhere classified   . Chronic bronchitis ([place])    "get it some; not q year" (11/11/2012)  . Chronic diastolic CHF (congestive heart failure) ([place]) 06/04/2013  . Chronic kidney disease, unspecified   . Coronary artery disease    non-obstructive by 2006 cath  . Dysuria   . Edema   . Encounter for therapeutic drug monitoring   . Exertional shortness of breath   . Frequent UTI    "get them a couple times/yr" (11/11/2012)  . Hemorrhage of rectum and anus   . High cholesterol   . History of blood transfusion 04/2011   "after hip OR" (11/11/2012)  . Hypertension 05/20/2011  . Hypertr obst cardiomyop   . Hypotension, unspecified    cardiac cath 2006..nonobstructive CAD 30-40s lesions.Marland KitchenETT 1/09 nondiagnostic due to poor HR response..Right Renal Cancer 2003  . Long term (current) use of anticoagulants   . Malignant neoplasm of kidney, except pelvis   . Osteoarthritis of right hip   .  Other acute sinusitis   . Other and unspecified coagulation defects   . Other malaise and fatigue   . Other screening mammogram   . PONV (postoperative nausea and vomiting)   . Pre-operative cardiovascular examination   . Renal cancer ([place]) 06/2001   Right  . Secondary cardiomyopathy, unspecified   . Sinus bradycardia   . Special screening for osteoporosis   . Urge incontinence     Past Surgical History:  Procedure Laterality Date  . ABDOMINAL HYSTERECTOMY  1975   for benign causes  . APPENDECTOMY    . BI-VENTRICULAR PACEMAKER UPGRADE  05/04/2010  . CARDIAC CATHETERIZATION  2006  . CATARACT EXTRACTION W/ INTRAOCULAR LENS  IMPLANT, BILATERAL  01/2006-02-2006  . CHOLECYSTECTOMY N/A 11/11/2012   Procedure: LAPAROSCOPIC CHOLECYSTECTOMY WITH INTRAOPERATIVE CHOLANGIOGRAM;  Surgeon: Imogene Burn. Georgette Dover, MD;  Location: [facility];  Service: General;  Laterality: N/A;  . EP  IMPLANTABLE DEVICE N/A 02/21/2016   Procedure: ICD Generator Changeout;  Surgeon: Deboraha Sprang, MD;  Location: [facility];  Service: Cardiovascular;  Laterality: N/A;  . INSERT / REPLACE / REMOVE PACEMAKER  05-01-11   02-28-05-/05-04-10-ICD-MEDTRONIC MAXIMAL DR  . JOINT REPLACEMENT    . LAPAROSCOPIC CHOLECYSTECTOMY  11/11/2012  . LAPAROSCOPIC LYSIS OF ADHESIONS N/A 11/11/2012   Procedure: LAPAROSCOPIC LYSIS OF ADHESIONS;  Surgeon: Imogene Burn. Georgette Dover, MD;  Location: [facility];  Service: General;  Laterality: N/A;  . NEPHRECTOMY Right 06/2001    S/P RENAL CELL CANCER  . TOTAL HIP ARTHROPLASTY Right 05/03/2011   Procedure: TOTAL HIP ARTHROPLASTY ANTERIOR APPROACH;  Surgeon: Mcarthur Rossetti;  Location: [facility];  Service: Orthopedics;  Laterality: Right;  Removal of Cannulated Screws Right Hip, Right Direct Anterior Hip Replacement    Current Outpatient Prescriptions  Medication Sig Dispense Refill  . acetaminophen (TYLENOL) 500 MG tablet Take 1,000 mg by mouth every 6 (six) hours as needed for headache.    . carvedilol (COREG) 12.5 MG tablet Take 1 tablet (12.5 mg total) by mouth 2 (two) times daily with a meal. 60 tablet 10  . cetirizine (ZYRTEC) 10 MG tablet Take 10 mg by mouth daily.     . disopyramide (NORPACE) 150 MG capsule TAKE 1 CAPSULE BY MOUTH TWICE DAILY 60 capsule 6  . fluticasone (FLONASE) 50 MCG/ACT nasal spray Place 2 sprays into both nostrils daily. 16 g 6  . furosemide (LASIX) 40 MG tablet Take 1 tablet (40 mg total) by mouth 3 (three) times daily. 90 tablet 10  . rosuvastatin (CRESTOR) 40 MG tablet Take 1 tablet (40 mg total) by mouth daily. 90 tablet 3  . sildenafil (REVATIO) 20 MG tablet Take 1 tablet (20 mg total) by mouth 3 (three) times daily. 90 tablet 6  . warfarin (COUMADIN) 2 MG tablet TAKE AS DIRECTED BY MOUTH ANTI-COAGULATION CLINIC 120 tablet 3   No current facility-administered medications for this visit.     Allergies  Allergen Reactions  . Codeine  Nausea And Vomiting  . Morphine And Related Nausea And Vomiting  . Sulfonamide Derivatives Other (See Comments)    REACTION: Dry mouth    Review of Systems negative except from HPI and PMH  Physical Exam BP 130/70 (BP Location: Left Arm, Patient Position: Sitting, Cuff Size: Normal)   Pulse 72   Ht 5' 6.5" (1.689 m)   Wt 193 lb (87.5 kg)   BMI 30.68 kg/m  Well developed and well nourished in no acute distress HENT normal E scleral and icterus clear Neck Supple JVP flat;  carotids brisk and full Device pocket well healed; wihtout hematoma but with considerable swelling  There is no tethering   no erythema in the incision line of her device generator replacement Clear to ausculation  Regular rate and rhythm, no murmurs gallops or rub Soft with active bowel sounds No clubbing cyanosis 1-2+ Edema Alert and oriented, grossly normal motor and sensory function Skin Warm and Dry  ECG demonstrates  AV pacing  Assessment and  Plan  Nonischemic cardiomyopathy with interval normalization of LV function  Sinus node dysfunction device dependent   Complete heart block intermittent  Atrial fibrillation persistent  High risk medication surveillance-disopyramide  Device related chest pain   Renal insufficiency GFR  grade 3  10/16  Implantable defibrillator   Her pocket remains a concern. All the cultures were negative at generator replacement, the sensitivity is lacking   She's having problems with congestive heart failure. We have reviewed salt and fluid intake in the reviewed the physiology. We have also arranged for follow-up with the ICM clinic.   Infrequent Afib  Continue disopyramide--will check levels and Bmet  More than 50% of 45 min was spent in counseling related to the above

## 2016-05-14 NOTE — Progress Notes (Signed)
Referred to ICM clinic by Levander Campion, device RN and Dr Caryl Comes.  Met patient in office and agreed to monthly ICM follow up.  Provided ICM direct number and encouraged to call for fluid symptoms.  She gave permission to leave detailed message on cell phone and home phone does not have a voice mail set up.  1st ICM remote transmission scheduled for 05/30/2016.

## 2016-05-14 NOTE — Patient Instructions (Addendum)
Medication Instructions: - Your physician recommends that you continue on your current medications as directed. Please refer to the Current Medication list given to you today.  Labwork: - none ordered  Procedures/Testing: - none ordered  Follow-Up: - Your physician recommends that you schedule a follow-up appointment in: 3 months with Dr. Caryl Comes.  Any Additional Special Instructions Will Be Listed Below (If Applicable). - Decrease water intake - Decrease sodium intake    If you need a refill on your cardiac medications before your next appointment, please call your pharmacy.

## 2016-05-16 ENCOUNTER — Other Ambulatory Visit: Payer: Self-pay

## 2016-05-16 DIAGNOSIS — E876 Hypokalemia: Secondary | ICD-10-CM

## 2016-05-17 ENCOUNTER — Ambulatory Visit (INDEPENDENT_AMBULATORY_CARE_PROVIDER_SITE_OTHER): Payer: PPO

## 2016-05-17 DIAGNOSIS — Z7902 Long term (current) use of antithrombotics/antiplatelets: Secondary | ICD-10-CM

## 2016-05-17 LAB — POCT INR: INR: 2.6

## 2016-05-17 NOTE — Patient Instructions (Signed)
Pre visit review using our clinic review tool, if applicable. No additional management support is needed unless otherwise documented below in the visit note. 

## 2016-05-22 ENCOUNTER — Other Ambulatory Visit: Payer: PPO

## 2016-05-23 ENCOUNTER — Ambulatory Visit: Payer: PPO

## 2016-05-27 ENCOUNTER — Other Ambulatory Visit: Payer: PPO

## 2016-05-28 ENCOUNTER — Ambulatory Visit (INDEPENDENT_AMBULATORY_CARE_PROVIDER_SITE_OTHER): Payer: PPO

## 2016-05-28 DIAGNOSIS — E538 Deficiency of other specified B group vitamins: Secondary | ICD-10-CM | POA: Diagnosis not present

## 2016-05-28 MED ORDER — CYANOCOBALAMIN 1000 MCG/ML IJ SOLN
1000.0000 ug | Freq: Once | INTRAMUSCULAR | Status: AC
  Administered 2016-05-28: 1000 ug via INTRAMUSCULAR

## 2016-05-30 ENCOUNTER — Ambulatory Visit (INDEPENDENT_AMBULATORY_CARE_PROVIDER_SITE_OTHER): Payer: PPO

## 2016-05-30 ENCOUNTER — Telehealth: Payer: Self-pay | Admitting: Cardiology

## 2016-05-30 DIAGNOSIS — I5032 Chronic diastolic (congestive) heart failure: Secondary | ICD-10-CM

## 2016-05-30 DIAGNOSIS — Z95810 Presence of automatic (implantable) cardiac defibrillator: Secondary | ICD-10-CM

## 2016-05-30 NOTE — Progress Notes (Signed)
EPIC Encounter for ICM Monitoring  Patient Name: [name] is a 83 y.o. female Date: 05/30/2016 Primary Care Physican: [name], MD Primary Cardiologist: Rockey Situ Electrophysiologist: Caryl Comes Dry Weight:  unknown - does not weigh        1st ICM encounter.  Heart Failure questions reviewed, pt reported chronic leg swelling which has been present since implant replacement in October 2017.  She reported she has poor leg circulation  Thoracic impedance normal since 05/11/2016 but was abnormal suggesting fluid accumulation from 04/26/2016 to 05/11/2016.  She takes Furosemide 40 mg 1 tablet 2-3 times a day.  She does not always take 3 a day as prescribed.   Recommendations: No changes. Reminded to limit dietary salt intake to 2000 mg/day and fluid intake to < 2 liters/day. Encouraged to call for fluid symptoms.  Follow-up plan: ICM clinic phone appointment on 07/02/2016.  Copy of ICM check sent to device physician.   3 month ICM trend: 05/30/2016   1 Year ICM trend:      Rosalene Billings, RN 05/30/2016 5:27 PM

## 2016-05-30 NOTE — Telephone Encounter (Signed)
Spoke with pt and reminded pt of remote transmission that is due today. Pt verbalized understanding.   

## 2016-05-31 ENCOUNTER — Other Ambulatory Visit: Payer: Self-pay | Admitting: Family Medicine

## 2016-05-31 DIAGNOSIS — I6523 Occlusion and stenosis of bilateral carotid arteries: Secondary | ICD-10-CM

## 2016-06-03 ENCOUNTER — Other Ambulatory Visit (INDEPENDENT_AMBULATORY_CARE_PROVIDER_SITE_OTHER): Payer: PPO

## 2016-06-03 DIAGNOSIS — E876 Hypokalemia: Secondary | ICD-10-CM | POA: Diagnosis not present

## 2016-06-04 LAB — BASIC METABOLIC PANEL
BUN/Creatinine Ratio: 9 — ABNORMAL LOW (ref 12–28)
BUN: 15 mg/dL (ref 8–27)
CO2: 25 mmol/L (ref 18–29)
Calcium: 9.1 mg/dL (ref 8.7–10.3)
Chloride: 99 mmol/L (ref 96–106)
Creatinine, Ser: 1.61 mg/dL — ABNORMAL HIGH (ref 0.57–1.00)
GFR calc Af Amer: 35 mL/min/{1.73_m2} — ABNORMAL LOW (ref >59)
GFR calc non Af Amer: 30 mL/min/{1.73_m2} — ABNORMAL LOW (ref >59)
Glucose: 87 mg/dL (ref 65–99)
Potassium: 4.6 mmol/L (ref 3.5–5.2)
Sodium: 141 mmol/L (ref 134–144)

## 2016-06-13 ENCOUNTER — Ambulatory Visit: Payer: PPO

## 2016-06-13 DIAGNOSIS — I6523 Occlusion and stenosis of bilateral carotid arteries: Secondary | ICD-10-CM | POA: Diagnosis not present

## 2016-06-14 LAB — VAS US CAROTID
LEFT ECA DIAS: 0 cm/s
LEFT VERTEBRAL DIAS: 16 cm/s
Left CCA dist dias: -16 cm/s
Left CCA dist sys: -86 cm/s
Left CCA prox dias: 27 cm/s
Left CCA prox sys: 146 cm/s
Left ICA dist dias: -27 cm/s
Left ICA dist sys: -155 cm/s
Left ICA prox dias: -28 cm/s
Left ICA prox sys: -258 cm/s
RIGHT ECA DIAS: 0 cm/s
RIGHT VERTEBRAL DIAS: 11 cm/s
Right CCA prox dias: 0 cm/s
Right CCA prox sys: 165 cm/s
Right cca dist sys: 109 cm/s

## 2016-06-18 ENCOUNTER — Ambulatory Visit: Payer: PPO

## 2016-06-25 ENCOUNTER — Ambulatory Visit: Payer: PPO

## 2016-07-02 ENCOUNTER — Ambulatory Visit (INDEPENDENT_AMBULATORY_CARE_PROVIDER_SITE_OTHER): Payer: PPO

## 2016-07-02 ENCOUNTER — Ambulatory Visit: Payer: PPO

## 2016-07-02 DIAGNOSIS — Z7902 Long term (current) use of antithrombotics/antiplatelets: Secondary | ICD-10-CM | POA: Diagnosis not present

## 2016-07-02 DIAGNOSIS — E538 Deficiency of other specified B group vitamins: Secondary | ICD-10-CM | POA: Diagnosis not present

## 2016-07-02 LAB — POCT INR: INR: 2

## 2016-07-02 MED ORDER — CYANOCOBALAMIN 1000 MCG/ML IJ SOLN
1000.0000 ug | Freq: Once | INTRAMUSCULAR | Status: AC
  Administered 2016-07-02: 1000 ug via INTRAMUSCULAR

## 2016-07-02 NOTE — Patient Instructions (Signed)
Pre visit review using our clinic review tool, if applicable. No additional management support is needed unless otherwise documented below in the visit note. 

## 2016-07-08 NOTE — Progress Notes (Signed)
No ICM remote transmission received for 07/01/2016 and next ICM transmission scheduled for 07/22/2016.

## 2016-07-22 ENCOUNTER — Ambulatory Visit (INDEPENDENT_AMBULATORY_CARE_PROVIDER_SITE_OTHER): Payer: PPO

## 2016-07-22 ENCOUNTER — Telehealth: Payer: Self-pay

## 2016-07-22 DIAGNOSIS — I5032 Chronic diastolic (congestive) heart failure: Secondary | ICD-10-CM

## 2016-07-22 DIAGNOSIS — Z95810 Presence of automatic (implantable) cardiac defibrillator: Secondary | ICD-10-CM | POA: Diagnosis not present

## 2016-07-22 NOTE — Progress Notes (Signed)
EPIC Encounter for ICM Monitoring  Patient Name: [name] is a 83 y.o. female Date: 07/22/2016 Primary Care Physican: [name], MD Primary Cardiologist: Rockey Situ Electrophysiologist: Caryl Comes Dry Weight:     unknown - does not weigh       Attempted call to patient and unable to reach.  Left message to return call.  Transmission reviewed.    Thoracic impedance abnormal suggesting fluid accumulation.  Prescribed dosage:  Furosemide 40 mg 1 tablet 2-3 times a day.  She does not always take 3 a day as prescribed.  Labs: 06/03/2016 Creatinine 1.61, BUN 15, Potassium 4.6, Sodium 141, EGFR 30-35 02/29/2016 Creatinine 1.63, BUN 19, Potassium 305, Sodium 139, EGFR 29-33  03/19/2016 Creatinine 1.59, BUN 20, Potassium 4.1, Sodium 141  02/12/2016 Creatinine 1.70, BUN 20, Potassium 3.8, Sodium 139  12/26/2015 Creatinine 1.54, BUN 17, Potassium 4.0, Sodium 139, EGFR 31-36  07/27/2015 Creatinine 1.30, BUN 14, Potassium 4.9, Sodium 143, EGFR 39-45   Recommendations: NONE - Unable to reach patient   Follow-up plan: ICM clinic phone appointment on 08/05/2016.  Office appointment with Dr Caryl Comes and Dr Rockey Situ on 08/13/2016.  Copy of ICM check sent to primary cardiologist and device physician.   3 month ICM trend: 07/22/2016   1 Year ICM trend:      Rosalene Billings, RN 07/22/2016 10:31 AM

## 2016-07-22 NOTE — Telephone Encounter (Signed)
Remote ICM transmission received.  Attempted patient call and left message to return call.   

## 2016-07-26 ENCOUNTER — Telehealth: Payer: Self-pay | Admitting: Family Medicine

## 2016-07-26 ENCOUNTER — Encounter: Payer: Self-pay | Admitting: Primary Care

## 2016-07-26 ENCOUNTER — Ambulatory Visit (INDEPENDENT_AMBULATORY_CARE_PROVIDER_SITE_OTHER): Payer: PPO | Admitting: Primary Care

## 2016-07-26 VITALS — BP 120/68 | HR 74 | Temp 97.6°F | Ht 66.0 in | Wt 196.0 lb

## 2016-07-26 DIAGNOSIS — N390 Urinary tract infection, site not specified: Secondary | ICD-10-CM | POA: Diagnosis not present

## 2016-07-26 DIAGNOSIS — R319 Hematuria, unspecified: Secondary | ICD-10-CM

## 2016-07-26 LAB — POC URINALSYSI DIPSTICK (AUTOMATED)
Bilirubin, UA: NEGATIVE
Glucose, UA: NEGATIVE
Ketones, UA: NEGATIVE
Nitrite, UA: POSITIVE
Protein, UA: NEGATIVE
Spec Grav, UA: 1.003 (ref 1.030–1.035)
Urobilinogen, UA: 0.2 (ref ?–2.0)
pH, UA: 5.5 (ref 5.0–8.0)

## 2016-07-26 MED ORDER — CEPHALEXIN 500 MG PO CAPS: 500.0000 mg | ORAL_CAPSULE | Freq: Two times a day (BID) | ORAL | 0 refills | Status: DC

## 2016-07-26 NOTE — Progress Notes (Signed)
Subjective:    Patient ID: [name], female    DOB: 1933-07-11, 83 y.o.   MRN: [id number]  HPI  [name] is an 83 year old female with a history of UTI's who presents today with a chief complaint of hematuria. She also reports pelvic pressure, dysuria, incomplete bladder emptying. Her symptoms began 2-3 days ago. She denies fevers, flank pain, abdominal pain, nausea. She's not taken anything OTC for her symptoms.   Review of Systems  Constitutional: Positive for fatigue. Negative for fever.  Genitourinary: Positive for dysuria, frequency, hematuria and pelvic pain. Negative for flank pain and vaginal discharge.       Past Medical History:  Diagnosis Date  . Adjustment disorder with anxiety   . Anginal pain ([place])   . Arthritis    "some in my hands" (11/11/2012)  . Atrial fibrillation ([place])   . Automatic implantable cardioverter-defibrillator in situ   . Blood in stool   . Carotid artery stenosis 09/2007   60-79% bilateral (stable)  . Carotid artery stenosis 10/2008   40-59% R 60-79%   . Chest pain, unspecified   . Chronic airway obstruction, not elsewhere classified   . Chronic bronchitis ([place])    "get it some; not q year" (11/11/2012)  . Chronic diastolic CHF (congestive heart failure) ([place]) 06/04/2013  . Chronic kidney disease, unspecified   . Coronary artery disease    non-obstructive by 2006 cath  . Dysuria   . Edema   . Encounter for therapeutic drug monitoring   . Exertional shortness of breath   . Frequent UTI    "get them a couple times/yr" (11/11/2012)  . Hemorrhage of rectum and anus   . High cholesterol   . History of blood transfusion 04/2011   "after hip OR" (11/11/2012)  . Hypertension 05/20/2011  . Hypertr obst cardiomyop   . Hypotension, unspecified    cardiac cath 2006..nonobstructive CAD 30-40s lesions.Marland KitchenETT 1/09 nondiagnostic due to poor HR response..Right Renal Cancer 2003  . Long term (current) use of anticoagulants   . Malignant neoplasm of  kidney, except pelvis   . Osteoarthritis of right hip   . Other acute sinusitis   . Other and unspecified coagulation defects   . Other malaise and fatigue   . Other screening mammogram   . PONV (postoperative nausea and vomiting)   . Pre-operative cardiovascular examination   . Renal cancer ([place]) 06/2001   Right  . Secondary cardiomyopathy, unspecified   . Sinus bradycardia   . Special screening for osteoporosis   . Urge incontinence      Social History   Social History  . Marital status: Widowed    Spouse name: N/A  . Number of children: N/A  . Years of education: N/A   Occupational History  . Retired Retired   Social History Main Topics  . Smoking status: Former Smoker    Packs/day: 0.50    Years: 40.00    Types: Cigarettes    Quit date: 05/06/2001  . Smokeless tobacco: Never Used  . Alcohol use No  . Drug use: No  . Sexual activity: No   Other Topics Concern  . 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   ALCOHOL USE-NO   RETIRED   FORMER TOBACCO USE.....1/2 PACK X 40 YRS  UNTIL 2003   Would desire CPR    Past Surgical History:  Procedure Laterality Date  . ABDOMINAL HYSTERECTOMY  1975   for benign causes  . APPENDECTOMY    . BI-VENTRICULAR PACEMAKER UPGRADE  05/04/2010  . CARDIAC CATHETERIZATION  2006  . CATARACT EXTRACTION W/ INTRAOCULAR LENS  IMPLANT, BILATERAL  01/2006-02-2006  . CHOLECYSTECTOMY N/A 11/11/2012   Procedure: LAPAROSCOPIC CHOLECYSTECTOMY WITH INTRAOPERATIVE CHOLANGIOGRAM;  Surgeon: Imogene Burn. Georgette Dover, MD;  Location: [facility];  Service: General;  Laterality: N/A;  . EP IMPLANTABLE DEVICE N/A 02/21/2016   Procedure: ICD Generator Changeout;  Surgeon: Deboraha Sprang, MD;  Location: [facility] [facility];  Service: Cardiovascular;  Laterality: N/A;  . INSERT / REPLACE / REMOVE PACEMAKER   05-01-11   02-28-05-/05-04-10-ICD-MEDTRONIC MAXIMAL DR  . JOINT REPLACEMENT    . LAPAROSCOPIC CHOLECYSTECTOMY  11/11/2012  . LAPAROSCOPIC LYSIS OF ADHESIONS N/A 11/11/2012   Procedure: LAPAROSCOPIC LYSIS OF ADHESIONS;  Surgeon: Imogene Burn. Georgette Dover, MD;  Location: [place];  Service: General;  Laterality: N/A;  . NEPHRECTOMY Right 06/2001    S/P RENAL CELL CANCER  . TOTAL HIP ARTHROPLASTY Right 05/03/2011   Procedure: TOTAL HIP ARTHROPLASTY ANTERIOR APPROACH;  Surgeon: Mcarthur Rossetti;  Location: [facility];  Service: Orthopedics;  Laterality: Right;  Removal of Cannulated Screws Right Hip, Right Direct Anterior Hip Replacement    Family History  Problem Relation Age of Onset  . Heart failure Mother   . Breast cancer Maternal Aunt 82  . Breast cancer Cousin   . Colon cancer Neg Hx     Allergies  Allergen Reactions  . Codeine Nausea And Vomiting  . Morphine And Related Nausea And Vomiting  . Sulfonamide Derivatives Other (See Comments)    REACTION: Dry mouth    Current Outpatient Prescriptions on File Prior to Visit  Medication Sig Dispense Refill  . acetaminophen (TYLENOL) 500 MG tablet Take 1,000 mg by mouth every 6 (six) hours as needed for headache.    . carvedilol (COREG) 12.5 MG tablet Take 1 tablet (12.5 mg total) by mouth 2 (two) times daily with a meal. 60 tablet 10  . cetirizine (ZYRTEC) 10 MG tablet Take 10 mg by mouth daily.     . disopyramide (NORPACE) 150 MG capsule TAKE 1 CAPSULE BY MOUTH TWICE DAILY 60 capsule 6  . fluticasone (FLONASE) 50 MCG/ACT nasal spray Place 2 sprays into both nostrils daily. 16 g 6  . furosemide (LASIX) 40 MG tablet Take 1 tablet (40 mg total) by mouth 3 (three) times daily. 90 tablet 10  . rosuvastatin (CRESTOR) 40 MG tablet Take 1 tablet (40 mg total) by mouth daily. 90 tablet 3  . sildenafil (REVATIO) 20 MG tablet Take 1 tablet (20 mg total) by mouth 3 (three) times daily. 90 tablet 6  . warfarin (COUMADIN) 2 MG tablet TAKE AS DIRECTED BY MOUTH  ANTI-COAGULATION CLINIC 120 tablet 3  . [DISCONTINUED] hydrochlorothiazide (HYDRODIURIL) 25 MG tablet Take 25 mg by mouth daily after breakfast.      No current facility-administered medications on file prior to visit.     BP 120/68   Pulse 74   Temp 97.6 F (36.4 C)   Ht 5\' 6"  (1.676 m)   Wt 196 lb (88.9 kg)   SpO2 98%   BMI 31.64 kg/m    Objective:   Physical Exam  Constitutional: She appears well-nourished.  Neck: Neck supple.  Cardiovascular: Normal rate and regular rhythm.   Pulmonary/Chest: Effort normal and breath sounds normal.  Abdominal: Soft. There is no tenderness. There is no CVA tenderness.  Skin: Skin is warm and dry.          Assessment & Plan:  Urinary Tract Infection:  Hematuria, pelvic pressure, dysuria x 2-3 days. Exam today without evidence of acute pyelonephritis. She does appear stable, ,vitals stable. UA: 3+ leuks, positive nitrites, 3+ blood. Culture sent. Rx for Cephalexin course sent to pharmacy. Push fluids, follow up PRN.  Sheral Flow, NP

## 2016-07-26 NOTE — Telephone Encounter (Signed)
Noted, will evaluate. 

## 2016-07-26 NOTE — Addendum Note (Signed)
Addended by: Elta Guadeloupe on: 07/26/2016 10:45 AM   Modules accepted: Orders

## 2016-07-26 NOTE — Patient Instructions (Signed)
Start Cephalexin antibiotics. Take 1 capsule by mouth twice daily for 7 days.  Continue to drink enough water to stay hydrated.  Please call us if no improvement by Monday next week.  It was a pleasure meeting you!

## 2016-07-26 NOTE — Telephone Encounter (Signed)
Pt has appt with Gentry Fitz NP 07/26/16 at 10 AM.

## 2016-07-26 NOTE — Telephone Encounter (Signed)
Patient Name: [name]  DOB: 03/04/1934    Initial Comment Caller states she has been dizzy, and not feeling like herself for a couple days. Afraid she may have a Kidney infection. Blood when she wipes, and pressure when urinates, she only has one kidney. Chills.   Nurse Assessment  Nurse: Raphael Gibney, RN, Vanita Ingles Date/Time (Eastern Time): 07/26/2016 8:11:13 AM  Confirm and document reason for call. If symptomatic, describe symptoms. ---Caller states she has been dizzy and lightheadedfor 2 days. No vomiting. no fever. She has some pain and pressure when she urinates. Has noticed some blood when she wipes. No back pain.  Does the patient have any new or worsening symptoms? ---Yes  Will a triage be completed? ---Yes  Related visit to physician within the last 2 weeks? ---No  Does the PT have any chronic conditions? (i.e. diabetes, asthma, etc.) ---Yes  List chronic conditions. ---heart problems; 1 kidney; pacemaker.  Is this a behavioral health or substance abuse call? ---No     Guidelines    Guideline Title Affirmed Question Affirmed Notes  Dizziness - Lightheadedness [1] MODERATE dizziness (e.g., interferes with normal activities) AND [2] has NOT been evaluated by physician for this (Exception: dizziness caused by heat exposure, sudden standing, or poor fluid intake)   Urination Pain - Female Blood in urine (red, pink, or tea-colored)    Final Disposition User   See Physician within 24 Hours [place], RN, Vera    Comments  appt scheduled for 07/26/2016 at 10 am with Alma Friendly   Referrals  REFERRED TO PCP OFFICE   Disagree/Comply: Comply    Disagree/Comply: Comply

## 2016-07-29 ENCOUNTER — Telehealth: Payer: Self-pay

## 2016-07-29 LAB — URINE CULTURE

## 2016-07-29 NOTE — Telephone Encounter (Signed)
Patient [name] questioning need for INR check tomorrow due to starting Keflex on Friday, 07/26/16.  This Probation officer called patient and informed of the necessity to keep INR appointment tomorrow with the coumadin clinic.  Patient verbalizes understanding and will come at 2pm on 07/30/16.

## 2016-07-30 ENCOUNTER — Ambulatory Visit (INDEPENDENT_AMBULATORY_CARE_PROVIDER_SITE_OTHER): Payer: PPO

## 2016-07-30 ENCOUNTER — Ambulatory Visit: Payer: PPO

## 2016-07-30 DIAGNOSIS — Z7902 Long term (current) use of antithrombotics/antiplatelets: Secondary | ICD-10-CM | POA: Diagnosis not present

## 2016-07-30 DIAGNOSIS — E538 Deficiency of other specified B group vitamins: Secondary | ICD-10-CM | POA: Diagnosis not present

## 2016-07-30 LAB — POCT INR: INR: 3.4

## 2016-07-30 MED ORDER — CYANOCOBALAMIN 1000 MCG/ML IJ SOLN
1000.0000 ug | Freq: Once | INTRAMUSCULAR | Status: AC
Start: 2016-07-30 — End: 2016-07-30
  Administered 2016-07-30: 1000 ug via INTRAMUSCULAR

## 2016-07-30 NOTE — Patient Instructions (Signed)
Pre visit review using our clinic review tool, if applicable. No additional management support is needed unless otherwise documented below in the visit note. 

## 2016-07-31 ENCOUNTER — Encounter: Payer: Self-pay | Admitting: Primary Care

## 2016-07-31 ENCOUNTER — Ambulatory Visit (INDEPENDENT_AMBULATORY_CARE_PROVIDER_SITE_OTHER): Payer: PPO | Admitting: Primary Care

## 2016-07-31 VITALS — BP 136/80 | HR 70 | Temp 97.9°F | Ht 66.0 in | Wt 197.1 lb

## 2016-07-31 DIAGNOSIS — J069 Acute upper respiratory infection, unspecified: Secondary | ICD-10-CM | POA: Diagnosis not present

## 2016-07-31 NOTE — Patient Instructions (Signed)
Your symptoms are representative of a viral illness which will resolve on its own over time. Our goal is to treat your symptoms in order to aid your body in the healing process and to make you more comfortable.   Nasal Congestion/Head Congestion: Try using Flonase (fluticasone) nasal spray. Instill 1 spray in each nostril twice daily.   Continue Zyrtec.  Cough: Try Delsym or Robitussin. These may be purchased over the counter.  It was a pleasure to see you today!   Upper Respiratory Infection, Adult Most upper respiratory infections (URIs) are caused by a virus. A URI affects the nose, throat, and upper air passages. The most common type of URI is often called "the common cold." Follow these instructions at home:  Take medicines only as told by your doctor.  Gargle warm saltwater or take cough drops to comfort your throat as told by your doctor.  Use a warm mist humidifier or inhale steam from a shower to increase air moisture. This may make it easier to breathe.  Drink enough fluid to keep your pee (urine) clear or pale yellow.  Eat soups and other clear broths.  Have a healthy diet.  Rest as needed.  Go back to work when your fever is gone or your doctor says it is okay.  You may need to stay home longer to avoid giving your URI to others.  You can also wear a face mask and wash your hands often to prevent spread of the virus.  Use your inhaler more if you have asthma.  Do not use any tobacco products, including cigarettes, chewing tobacco, or electronic cigarettes. If you need help quitting, ask your doctor. Contact a doctor if:  You are getting worse, not better.  Your symptoms are not helped by medicine.  You have chills.  You are getting more short of breath.  You have brown or red mucus.  You have yellow or brown discharge from your nose.  You have pain in your face, especially when you bend forward.  You have a fever.  You have puffy (swollen) neck  glands.  You have pain while swallowing.  You have white areas in the back of your throat. Get help right away if:  You have very bad or constant:  Headache.  Ear pain.  Pain in your forehead, behind your eyes, and over your cheekbones (sinus pain).  Chest pain.  You have long-lasting (chronic) lung disease and any of the following:  Wheezing.  Long-lasting cough.  Coughing up blood.  A change in your usual mucus.  You have a stiff neck.  You have changes in your:  Vision.  Hearing.  Thinking.  Mood. This information is not intended to replace advice given to you by your health care provider. Make sure you discuss any questions you have with your health care provider. Document Released: 10/09/2007 Document Revised: 12/24/2015 Document Reviewed: 07/28/2013 Elsevier Interactive Patient Education  2017 [employer].

## 2016-07-31 NOTE — Progress Notes (Signed)
Subjective:    Patient ID: [name], female    DOB: May 16, 1933, 83 y.o.   MRN: [id number]  HPI  [name] is an 83 year old female with a history of COPD, allergic rhinitis, atrial fibrillation who presents today with a chief complaint of cough. She also reports nasal congestion, shortness of breath, chills, low grade fever. Her symptoms began about 6-7 days ago, her cough began yesterday. Her cough is dry. She's been taking Zyrtec daily for her symptoms without much improvement.  She was treated five days ago for urinary tract infection with Keflex course. She will complete the Keflex course tomorrow and has been compliant. She wants to ensure she doesn't have the flu as we will be closed on Friday this week.   Review of Systems  Constitutional: Positive for chills and fatigue. Negative for fever.  HENT: Positive for congestion, postnasal drip and sinus pressure.   Respiratory: Positive for cough. Negative for shortness of breath.   Cardiovascular: Negative for chest pain.       Past Medical History:  Diagnosis Date  . Adjustment disorder with anxiety   . Anginal pain ([place])   . Arthritis    "some in my hands" (11/11/2012)  . Atrial fibrillation ([place])   . Automatic implantable cardioverter-defibrillator in situ   . Blood in stool   . Carotid artery stenosis 09/2007   60-79% bilateral (stable)  . Carotid artery stenosis 10/2008   40-59% R 60-79%   . Chest pain, unspecified   . Chronic airway obstruction, not elsewhere classified   . Chronic bronchitis ([place])    "get it some; not q year" (11/11/2012)  . Chronic diastolic CHF (congestive heart failure) ([place]) 06/04/2013  . Chronic kidney disease, unspecified   . Coronary artery disease    non-obstructive by 2006 cath  . Dysuria   . Edema   . Encounter for therapeutic drug monitoring   . Exertional shortness of breath   . Frequent UTI    "get them a couple times/yr" (11/11/2012)  . Hemorrhage of rectum and anus   . High  cholesterol   . History of blood transfusion 04/2011   "after hip OR" (11/11/2012)  . Hypertension 05/20/2011  . Hypertr obst cardiomyop   . Hypotension, unspecified    cardiac cath 2006..nonobstructive CAD 30-40s lesions.Marland KitchenETT 1/09 nondiagnostic due to poor HR response..Right Renal Cancer 2003  . Long term (current) use of anticoagulants   . Malignant neoplasm of kidney, except pelvis   . Osteoarthritis of right hip   . Other acute sinusitis   . Other and unspecified coagulation defects   . Other malaise and fatigue   . Other screening mammogram   . PONV (postoperative nausea and vomiting)   . Pre-operative cardiovascular examination   . Renal cancer ([place]) 06/2001   Right  . Secondary cardiomyopathy, unspecified   . Sinus bradycardia   . Special screening for osteoporosis   . Urge incontinence      Social History   Social History  . Marital status: Widowed    Spouse name: N/A  . Number of children: N/A  . Years of education: N/A   Occupational History  . Retired Retired   Social History Main Topics  . Smoking status: Former Smoker    Packs/day: 0.50    Years: 40.00    Types: Cigarettes    Quit date: 05/06/2001  . Smokeless tobacco: Never Used  . Alcohol use No  . Drug use: No  . Sexual  activity: No   Other Topics Concern  . 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   ALCOHOL USE-NO   RETIRED   FORMER TOBACCO USE.....1/2 PACK X 46 YRS UNTIL 2003   Would desire CPR    Past Surgical History:  Procedure Laterality Date  . ABDOMINAL HYSTERECTOMY  1975   for benign causes  . APPENDECTOMY    . BI-VENTRICULAR PACEMAKER UPGRADE  05/04/2010  . CARDIAC CATHETERIZATION  2006  . CATARACT EXTRACTION W/ INTRAOCULAR LENS  IMPLANT, BILATERAL  01/2006-02-2006  . CHOLECYSTECTOMY N/A 11/11/2012   Procedure:  LAPAROSCOPIC CHOLECYSTECTOMY WITH INTRAOPERATIVE CHOLANGIOGRAM;  Surgeon: Imogene Burn. Georgette Dover, MD;  Location: [facility];  Service: General;  Laterality: N/A;  . EP IMPLANTABLE DEVICE N/A 02/21/2016   Procedure: ICD Generator Changeout;  Surgeon: Deboraha Sprang, MD;  Location: [facility];  Service: Cardiovascular;  Laterality: N/A;  . INSERT / REPLACE / REMOVE PACEMAKER  05-01-11   02-28-05-/05-04-10-ICD-MEDTRONIC MAXIMAL DR  . JOINT REPLACEMENT    . LAPAROSCOPIC CHOLECYSTECTOMY  11/11/2012  . LAPAROSCOPIC LYSIS OF ADHESIONS N/A 11/11/2012   Procedure: LAPAROSCOPIC LYSIS OF ADHESIONS;  Surgeon: Imogene Burn. Georgette Dover, MD;  Location: [place];  Service: General;  Laterality: N/A;  . NEPHRECTOMY Right 06/2001    S/P RENAL CELL CANCER  . TOTAL HIP ARTHROPLASTY Right 05/03/2011   Procedure: TOTAL HIP ARTHROPLASTY ANTERIOR APPROACH;  Surgeon: Mcarthur Rossetti;  Location[facility];  Service: Orthopedics;  Laterality: Right;  Removal of Cannulated Screws Right Hip, Right Direct Anterior Hip Replacement    Family History  Problem Relation Age of Onset  . Heart failure Mother   . Breast cancer Maternal Aunt 82  . Breast cancer Cousin   . Colon cancer Neg Hx     Allergies  Allergen Reactions  . Codeine Nausea And Vomiting  . Morphine And Related Nausea And Vomiting  . Sulfonamide Derivatives Other (See Comments)    REACTION: Dry mouth    Current Outpatient Prescriptions on File Prior to Visit  Medication Sig Dispense Refill  . acetaminophen (TYLENOL) 500 MG tablet Take 1,000 mg by mouth every 6 (six) hours as needed for headache.    . carvedilol (COREG) 12.5 MG tablet Take 1 tablet (12.5 mg total) by mouth 2 (two) times daily with a meal. 60 tablet 10  . cephALEXin (KEFLEX) 500 MG capsule Take 1 capsule (500 mg total) by mouth 2 (two) times daily. 14 capsule 0  . cetirizine (ZYRTEC) 10 MG tablet Take 10 mg by mouth daily.     . disopyramide (NORPACE) 150 MG capsule TAKE 1 CAPSULE BY MOUTH TWICE  DAILY 60 capsule 6  . fluticasone (FLONASE) 50 MCG/ACT nasal spray Place 2 sprays into both nostrils daily. 16 g 6  . furosemide (LASIX) 40 MG tablet Take 1 tablet (40 mg total) by mouth 3 (three) times daily. 90 tablet 10  . rosuvastatin (CRESTOR) 40 MG tablet Take 1 tablet (40 mg total) by mouth daily. 90 tablet 3  . sildenafil (REVATIO) 20 MG tablet Take 1 tablet (20 mg total) by mouth 3 (three) times daily. 90 tablet 6  . warfarin (COUMADIN) 2 MG tablet TAKE AS DIRECTED BY MOUTH ANTI-COAGULATION CLINIC 120 tablet 3  . [DISCONTINUED] hydrochlorothiazide (HYDRODIURIL) 25 MG tablet Take 25 mg by mouth daily  after breakfast.      No current facility-administered medications on file prior to visit.     BP 136/80   Pulse 70   Temp 97.9 F (36.6 C) (Oral)   Ht 5\' 6"  (1.676 m)   Wt 197 lb 1.9 oz (89.4 kg)   SpO2 99%   BMI 31.82 kg/m    Objective:   Physical Exam  Constitutional: She appears well-nourished.  HENT:  Right Ear: Tympanic membrane and ear canal normal.  Left Ear: Tympanic membrane and ear canal normal.  Nose: Mucosal edema present. Right sinus exhibits maxillary sinus tenderness. Right sinus exhibits no frontal sinus tenderness. Left sinus exhibits maxillary sinus tenderness. Left sinus exhibits no frontal sinus tenderness.  Mouth/Throat: Oropharynx is clear and moist.  Eyes: Conjunctivae are normal.  Neck: Neck supple.  Cardiovascular: Normal rate and regular rhythm.   Pulmonary/Chest: Effort normal and breath sounds normal. She has no wheezes. She has no rales.  Lymphadenopathy:    She has no cervical adenopathy.  Skin: Skin is warm and dry.          Assessment & Plan:  URI vs Allergic Rhinitis:  Congestion, sinus pressure, fatigue, mild cough. Currently on treatment with Keflex for UTI. Exam today not suggestive of bacterial involvement, especially given treatment with keflex and presentation. Suspect viral involvement and will treat with conservative  measures. Discussed Flonsae, Zyrtec, Delsym.   Sheral Flow, NP

## 2016-07-31 NOTE — Progress Notes (Signed)
Pre visit review using our clinic review tool, if applicable. No additional management support is needed unless otherwise documented below in the visit note. 

## 2016-08-05 ENCOUNTER — Ambulatory Visit (INDEPENDENT_AMBULATORY_CARE_PROVIDER_SITE_OTHER): Payer: PPO

## 2016-08-05 DIAGNOSIS — Z95810 Presence of automatic (implantable) cardiac defibrillator: Secondary | ICD-10-CM | POA: Diagnosis not present

## 2016-08-05 DIAGNOSIS — I5032 Chronic diastolic (congestive) heart failure: Secondary | ICD-10-CM

## 2016-08-05 NOTE — Progress Notes (Signed)
EPIC Encounter for ICM Monitoring  Patient Name: [name] is a 83 y.o. female Date: 08/05/2016 Primary Care Physican: [name], MD Primary Cardiologist: Rockey Situ Electrophysiologist: Faustino Congress Weight:unknown - does not weigh      Heart Failure questions reviewed, pt symptomatic with legs and feet swelling and shortness of breath. She feels like she can't catch her breath and when she bends over she feels lightheaded.  When having bowel movement she feels like she is going to pass out if she bears down.  Has been on antibiotics for UTI in the last 10 days.  She visited PCP on 3/27 and per office note she had congestion, sinus pressure, fatigue, mild cough and is probably viral not bacterial.  Thoracic impedance abnormal suggesting fluid accumulation since 07/15/2016.  Prescribed dosage:  Furosemide 40 mg 1 tablet 2-3 times a day. She does not always take 3 tablets daily as prescribed.  Labs: 06/03/2016 Creatinine 1.61, BUN 15, Potassium 4.6, Sodium 141,  EGFR 30-35 02/29/2016 Creatinine 1.63, BUN 19, Potassium 3.5, Sodium 139, EGFR 29-33  03/19/2016 Creatinine 1.59, BUN 20, Potassium 4.1, Sodium 141  02/12/2016 Creatinine 1.70, BUN 20, Potassium 3.8, Sodium 139  12/26/2015 Creatinine 1.54, BUN 17, Potassium 4.0, Sodium 139, EGFR 31-36  07/27/2015 Creatinine 1.30, BUN 14, Potassium 4.9, Sodium 143, EGFR 39-45   Recommendations:  Advised would discuss with Dr Rockey Situ in the office and call back with any recommendations.   Follow-up plan: ICM clinic phone appointment on 08/12/2016 to recheck fluid levels.  Office appointment scheduled 08/13/2016 with Dr Rockey Situ.  Copy of ICM check sent to primary cardiologist and device physician.   3 month ICM trend: 08/02/2016   1 Year ICM trend:      Rosalene Billings, RN 08/05/2016 9:50 AM

## 2016-08-05 NOTE — Progress Notes (Signed)
Reviewed with Dr Rockey Situ and call back to patient.  Advised to take Furosemide 40 mg 2 tablets every am and 1 tablet in the afternoon around 2pm for the next 3 days instead of taking 1 tablet three times a day.  Advised to limit fluid intake to 64 oz a day and limit salt to 2000 mg daily.  She said she does read her food labels so she can monitor salt intake.  She said she thinks maybe the UTI is making her feel bad because her physician said it was a severe infection.  She finished the antibiotic last Thursday.  Encouraged her to have PCP check urine again to make sure the infection has resolved.

## 2016-08-06 ENCOUNTER — Ambulatory Visit (INDEPENDENT_AMBULATORY_CARE_PROVIDER_SITE_OTHER): Payer: PPO | Admitting: Family Medicine

## 2016-08-06 ENCOUNTER — Other Ambulatory Visit: Payer: Self-pay | Admitting: Internal Medicine

## 2016-08-06 ENCOUNTER — Ambulatory Visit (INDEPENDENT_AMBULATORY_CARE_PROVIDER_SITE_OTHER): Payer: PPO

## 2016-08-06 DIAGNOSIS — N3000 Acute cystitis without hematuria: Secondary | ICD-10-CM | POA: Diagnosis not present

## 2016-08-06 DIAGNOSIS — Z7902 Long term (current) use of antithrombotics/antiplatelets: Secondary | ICD-10-CM | POA: Diagnosis not present

## 2016-08-06 LAB — POCT INR: INR: 2.5

## 2016-08-06 LAB — POC URINALSYSI DIPSTICK (AUTOMATED)
Bilirubin, UA: NEGATIVE
Glucose, UA: NEGATIVE
Ketones, UA: NEGATIVE
Nitrite, UA: NEGATIVE
Protein, UA: NEGATIVE
Spec Grav, UA: 1.02 (ref 1.030–1.035)
Urobilinogen, UA: 0.2 (ref ?–2.0)
pH, UA: 6 (ref 5.0–8.0)

## 2016-08-06 MED ORDER — CIPROFLOXACIN HCL 250 MG PO TABS: 250.0000 mg | ORAL_TABLET | Freq: Two times a day (BID) | ORAL | 0 refills | Status: DC

## 2016-08-06 MED ORDER — BENZONATATE 200 MG PO CAPS: 200.0000 mg | ORAL_CAPSULE | Freq: Two times a day (BID) | ORAL | 0 refills | Status: DC | PRN

## 2016-08-06 NOTE — Progress Notes (Signed)
SUBJECTIVE: [name] is a 83 y.o. female who complains of urinary frequency, urgency and dysuria x 14 days, without flank pain, fever, chills, or abnormal vaginal discharge or bleeding.   Was treated for UTI by Allie Bossier on 07/26/16 with Keflex.  She has finished course of keflex. Urine cx reviewed- grew Klebsiella- sensitive to cephalosporins with exeception of cefazolin.  She started to feel a bit better but now the burning with urination is coming back.  She was also seen on 3/28 by Allie Bossier for URI symptoms. Note reviewed. Advised flonase, zyrtec and delsym.  Current Outpatient Prescriptions on File Prior to Visit  Medication Sig Dispense Refill  . acetaminophen (TYLENOL) 500 MG tablet Take 1,000 mg by mouth every 6 (six) hours as needed for headache.    . carvedilol (COREG) 12.5 MG tablet Take 1 tablet (12.5 mg total) by mouth 2 (two) times daily with a meal. 60 tablet 10  . cetirizine (ZYRTEC) 10 MG tablet Take 10 mg by mouth daily.     . fluticasone (FLONASE) 50 MCG/ACT nasal spray Place 2 sprays into both nostrils daily. 16 g 6  . furosemide (LASIX) 40 MG tablet Take 1 tablet (40 mg total) by mouth 3 (three) times daily. 90 tablet 10  . rosuvastatin (CRESTOR) 40 MG tablet Take 1 tablet (40 mg total) by mouth daily. 90 tablet 3  . sildenafil (REVATIO) 20 MG tablet Take 1 tablet (20 mg total) by mouth 3 (three) times daily. 90 tablet 6  . warfarin (COUMADIN) 2 MG tablet TAKE AS DIRECTED BY MOUTH ANTI-COAGULATION CLINIC 120 tablet 3  . [DISCONTINUED] hydrochlorothiazide (HYDRODIURIL) 25 MG tablet Take 25 mg by mouth daily after breakfast.      No current facility-administered medications on file prior to visit.     Allergies  Allergen Reactions  . Codeine Nausea And Vomiting  . Morphine And Related Nausea And Vomiting  . Sulfonamide Derivatives Other (See Comments)    REACTION: Dry mouth    Past Medical History:  Diagnosis Date  . Adjustment disorder with anxiety    . Anginal pain ([place])   . Arthritis    "some in my hands" (11/11/2012)  . Atrial fibrillation ([place])   . Automatic implantable cardioverter-defibrillator in situ   . Blood in stool   . Carotid artery stenosis 09/2007   60-79% bilateral (stable)  . Carotid artery stenosis 10/2008   40-59% R 60-79%   . Chest pain, unspecified   . Chronic airway obstruction, not elsewhere classified   . Chronic bronchitis ([place])    "get it some; not q year" (11/11/2012)  . Chronic diastolic CHF (congestive heart failure) ([place]) 06/04/2013  . Chronic kidney disease, unspecified   . Coronary artery disease    non-obstructive by 2006 cath  . Dysuria   . Edema   . Encounter for therapeutic drug monitoring   . Exertional shortness of breath   . Frequent UTI    "get them a couple times/yr" (11/11/2012)  . Hemorrhage of rectum and anus   . High cholesterol   . History of blood transfusion 04/2011   "after hip OR" (11/11/2012)  . Hypertension 05/20/2011  . Hypertr obst cardiomyop   . Hypotension, unspecified    cardiac cath 2006..nonobstructive CAD 30-40s lesions.Marland KitchenETT 1/09 nondiagnostic due to poor HR response..Right Renal Cancer 2003  . Long term (current) use of anticoagulants   . Malignant neoplasm of kidney, except pelvis   . Osteoarthritis of right hip   . Other acute  sinusitis   . Other and unspecified coagulation defects   . Other malaise and fatigue   . Other screening mammogram   . PONV (postoperative nausea and vomiting)   . Pre-operative cardiovascular examination   . Renal cancer ([place]) 06/2001   Right  . Secondary cardiomyopathy, unspecified   . Sinus bradycardia   . Special screening for osteoporosis   . Urge incontinence     Past Surgical History:  Procedure Laterality Date  . ABDOMINAL HYSTERECTOMY  1975   for benign causes  . APPENDECTOMY    . BI-VENTRICULAR PACEMAKER UPGRADE  05/04/2010  . CARDIAC CATHETERIZATION  2006  . CATARACT EXTRACTION W/ INTRAOCULAR LENS  IMPLANT, BILATERAL   01/2006-02-2006  . CHOLECYSTECTOMY N/A 11/11/2012   Procedure: LAPAROSCOPIC CHOLECYSTECTOMY WITH INTRAOPERATIVE CHOLANGIOGRAM;  Surgeon: Imogene Burn. Georgette Dover, MD;  Location: [facility];  Service: General;  Laterality: N/A;  . EP IMPLANTABLE DEVICE N/A 02/21/2016   Procedure: ICD Generator Changeout;  Surgeon: Deboraha Sprang, MD;  Location: [facility];  Service: Cardiovascular;  Laterality: N/A;  . INSERT / REPLACE / REMOVE PACEMAKER  05-01-11   02-28-05-/05-04-10-ICD-MEDTRONIC MAXIMAL DR  . JOINT REPLACEMENT    . LAPAROSCOPIC CHOLECYSTECTOMY  11/11/2012  . LAPAROSCOPIC LYSIS OF ADHESIONS N/A 11/11/2012   Procedure: LAPAROSCOPIC LYSIS OF ADHESIONS;  Surgeon: Imogene Burn. Georgette Dover, MD;  Location: [facility];  Service: General;  Laterality: N/A;  . NEPHRECTOMY Right 06/2001    S/P RENAL CELL CANCER  . TOTAL HIP ARTHROPLASTY Right 05/03/2011   Procedure: TOTAL HIP ARTHROPLASTY ANTERIOR APPROACH;  Surgeon: Mcarthur Rossetti;  Location: [facility];  Service: Orthopedics;  Laterality: Right;  Removal of Cannulated Screws Right Hip, Right Direct Anterior Hip Replacement    Family History  Problem Relation Age of Onset  . Heart failure Mother   . Breast cancer Maternal Aunt 82  . Breast cancer Cousin   . Colon cancer Neg Hx     Social History   Social History  . Marital status: Widowed    Spouse name: N/A  . Number of children: N/A  . Years of education: N/A   Occupational History  . Retired Retired   Social History Main Topics  . Smoking status: Former Smoker    Packs/day: 0.50    Years: 40.00    Types: Cigarettes    Quit date: 05/06/2001  . Smokeless tobacco: Never Used  . Alcohol use No  . Drug use: No  . Sexual activity: No   Other Topics Concern  . 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   ALCOHOL USE-NO   RETIRED   FORMER  TOBACCO USE.....1/2 PACK X 81 YRS UNTIL 2003   Would desire CPR   The PMH, PSH, Social History, Family History, Medications, and allergies have been reviewed in [facility], and have been updated if relevant.  OBJECTIVE:  BP 118/80   Pulse 70   Wt 197 lb 8 oz (89.6 kg)   SpO2 96%   BMI 31.88 kg/m   Appears well, in no apparent distress.  Vital signs are normal. The abdomen is soft without tenderness, guarding, mass, rebound or organomegaly. No CVA tenderness or inguinal adenopathy noted. Urine dipstick shows positive for WBC's.

## 2016-08-06 NOTE — Addendum Note (Signed)
Addended by: Clarnce Flock E on: 08/06/2016 12:25 PM   Modules accepted: Orders

## 2016-08-06 NOTE — Assessment & Plan Note (Signed)
With persistent symptoms and LE on UA. Will treat with cipro, send again for cx. The patient indicates understanding of these issues and agrees with the plan. Call or return to clinic prn if these symptoms worsen or fail to improve as anticipated.

## 2016-08-06 NOTE — Patient Instructions (Signed)
Pre visit review using our clinic review tool, if applicable. No additional management support is needed unless otherwise documented below in the visit note. 

## 2016-08-06 NOTE — Patient Instructions (Signed)
Great to see you. Please take cipro as directed- 1 tablet twice daily x 7 days.  Keep your appointment with your cardiologist

## 2016-08-09 ENCOUNTER — Other Ambulatory Visit: Payer: Self-pay | Admitting: Family Medicine

## 2016-08-09 LAB — URINE CULTURE

## 2016-08-09 MED ORDER — CEPHALEXIN 500 MG PO CAPS: 500.0000 mg | ORAL_CAPSULE | Freq: Two times a day (BID) | ORAL | 0 refills | Status: DC

## 2016-08-10 NOTE — Progress Notes (Signed)
Cardiology Office Note  Date:  08/13/2016   ID:  [name], DOB 1933/07/16, MRN [id number]  PCP:  [name], MD   Chief Complaint  Patient presents with  . other    6 mo follow up. Pt c/o chest discomfort at times, swelling goes down at night but in the morning swells back up.  Meds reviewed with patient.      HPI:  [name] is a 83 yo woman With a history of syncope,  symptomatic bradycardia.  status post ICD.  nonobstructive coronary artery disease with chronic chest pain,  bilateral carotid artery stenosis 60-70% disease on the left, paroxysmal atrial fibrillation,  renal cell carcinoma status post resection of her right kidney with residual chronic renal insufficiency,  COPD, stopped smoking over 10 years ago.  chronic leg pain single kidney, creatinine 1.4 She presents for routine follow-up of her carotid arterial disease, atrial fibrillation.  Recent UTI, ABX x 2 Cur back on her fluids TypicallyTaking lasix 40 BID Sometimes misses doses, will eat out with friends  Weight today down 6 pounds After being more compliant with her Lasix, Up to 80 in the morning, 40 in the afternoon optivol back down on recheck, report reviewed with her in detail  previous episode of chest pain, chronic shortness of breath Echocardiogram 2016 and most recently 2017 with elevated right heart pressures, 70 mmHg  Previously we added revatio 20 mg 3 times a day for pulmonary hypertension.  She developed headache and stopped the medication  severe ankle swelling after pacemaker was changed out   extra Lasix in addition to her 40 twice a day. Edema slowly resolved  EKG personally reviewed by myself on todays visit Shows paced rhythm rate 67 bpm  Other past medical history reviewed History of periodic symptoms of chest tightness, shortness of breath Smoked 30 years, quit 20 years, Takes Lasix 40 mg twice a day, Previously recommended to take higher dose Lasix for several days  in a row if she develops symptoms by Dr. Caryl Comes Has chronic leg pain  Echocardiogram results reviewed with her in detail, elevated right heart pressures, normal ejection fraction  In general she is sedentary, no regular exercise program Defibrillator download on today's visit as she is concerned about her battery No significant atrial fibrillation or atrial tachycardia episodes, perhaps longer episode in January 2017 otherwise 4 episodes lasting less than 2 hours She is happy with her norpace   Carotid ultrasound reviewed with her showing stable moderate to severe bilateral carotid disease Other past medical history  She did try flecainide in the past and she had significant side effects as well as breakthrough arrhythmia.  right hip replacement on May 03 2011.  Chronic dizziness since the summer of 2014 . Symptoms were worse in December 2014  Echocardiogram showed normal ejection fraction, moderate mitral valve regurgitation, moderate TR, moderately elevated right ventricular systolic pressures.  stress test in March 2011.   Last carotid u/s 40-59% R 60-79%. No neuro symptoms.   Cardiac cath 2006 --non-obstructive CAD 30-40s lesions She reports having repeat catheterization since that time with no significant progression of her disease   PMH:   has a past medical history of Adjustment disorder with anxiety; Anginal pain ([place]); Arthritis; Atrial fibrillation ([place]); Automatic implantable cardioverter-defibrillator in situ; Blood in stool; Carotid artery stenosis (09/2007); Carotid artery stenosis (10/2008); Chest pain, unspecified; Chronic airway obstruction, not elsewhere classified; Chronic bronchitis ([place]); Chronic diastolic CHF (congestive heart failure) ([place]) (06/04/2013); Chronic kidney disease, unspecified; Coronary  artery disease; Dysuria; Edema; Encounter for therapeutic drug monitoring; Exertional shortness of breath; Frequent UTI; Hemorrhage of rectum and anus;  High cholesterol; History of blood transfusion (04/2011); Hypertension (05/20/2011); Hypertr obst cardiomyop; Hypotension, unspecified; Long term (current) use of anticoagulants; Malignant neoplasm of kidney, except pelvis; Osteoarthritis of right hip; Other acute sinusitis; Other and unspecified coagulation defects; Other malaise and fatigue; Other screening mammogram; PONV (postoperative nausea and vomiting); Pre-operative cardiovascular examination; Renal cancer ([place]) (06/2001); Secondary cardiomyopathy, unspecified; Sinus bradycardia; Special screening for osteoporosis; and Urge incontinence.  PSH:    Past Surgical History:  Procedure Laterality Date  . ABDOMINAL HYSTERECTOMY  1975   for benign causes  . APPENDECTOMY    . BI-VENTRICULAR PACEMAKER UPGRADE  05/04/2010  . CARDIAC CATHETERIZATION  2006  . CATARACT EXTRACTION W/ INTRAOCULAR LENS  IMPLANT, BILATERAL  01/2006-02-2006  . CHOLECYSTECTOMY N/A 11/11/2012   Procedure: LAPAROSCOPIC CHOLECYSTECTOMY WITH INTRAOPERATIVE CHOLANGIOGRAM;  Surgeon: Imogene Burn. Georgette Dover, MD;  Location: [facility];  Service: General;  Laterality: N/A;  . EP IMPLANTABLE DEVICE N/A 02/21/2016   Procedure: ICD Generator Changeout;  Surgeon: Deboraha Sprang, MD;  Location: [facility];  Service: Cardiovascular;  Laterality: N/A;  . INSERT / REPLACE / REMOVE PACEMAKER  05-01-11   02-28-05-/05-04-10-ICD-MEDTRONIC MAXIMAL DR  . JOINT REPLACEMENT    . LAPAROSCOPIC CHOLECYSTECTOMY  11/11/2012  . LAPAROSCOPIC LYSIS OF ADHESIONS N/A 11/11/2012   Procedure: LAPAROSCOPIC LYSIS OF ADHESIONS;  Surgeon: Imogene Burn. Georgette Dover, MD;  Location: [facility];  Service: General;  Laterality: N/A;  . NEPHRECTOMY Right 06/2001    S/P RENAL CELL CANCER  . TOTAL HIP ARTHROPLASTY Right 05/03/2011   Procedure: TOTAL HIP ARTHROPLASTY ANTERIOR APPROACH;  Surgeon: Mcarthur Rossetti;  [facility];  Service: Orthopedics;  Laterality: Right;  Removal of Cannulated Screws Right Hip, Right Direct Anterior Hip  Replacement    Current Outpatient Prescriptions  Medication Sig Dispense Refill  . acetaminophen (TYLENOL) 500 MG tablet Take 1,000 mg by mouth every 6 (six) hours as needed for headache.    . benzonatate (TESSALON) 200 MG capsule Take 1 capsule (200 mg total) by mouth 2 (two) times daily as needed for cough. 30 capsule 0  . carvedilol (COREG) 12.5 MG tablet Take 1 tablet (12.5 mg total) by mouth 2 (two) times daily with a meal. 60 tablet 10  . cephALEXin (KEFLEX) 500 MG capsule Take 1 capsule (500 mg total) by mouth 2 (two) times daily. 14 capsule 0  . cetirizine (ZYRTEC) 10 MG tablet Take 10 mg by mouth daily.     . disopyramide (NORPACE) 150 MG capsule TAKE 1 CAPSULE BY MOUTH TWICE DAILY 60 capsule 3  . fluticasone (FLONASE) 50 MCG/ACT nasal spray Place 2 sprays into both nostrils daily. 16 g 6  . furosemide (LASIX) 40 MG tablet Take 1 tablet (40 mg total) by mouth 3 (three) times daily. 90 tablet 10  . rosuvastatin (CRESTOR) 40 MG tablet Take 1 tablet (40 mg total) by mouth daily. 90 tablet 3  . sildenafil (REVATIO) 20 MG tablet Take 1 tablet (20 mg total) by mouth 3 (three) times daily. 90 tablet 6  . warfarin (COUMADIN) 2 MG tablet TAKE AS DIRECTED BY MOUTH ANTI-COAGULATION CLINIC 120 tablet 3   No current facility-administered medications for this visit.      Allergies:   Codeine; Morphine and related; and Sulfonamide derivatives   Social History:  The patient  reports that she quit smoking about 15 years ago. Her smoking use included Cigarettes. She  has a 20.00 pack-year smoking history. She has never used smokeless tobacco. She reports that she does not drink alcohol or use drugs.   Family History:   family history includes Breast cancer in her cousin; Breast cancer (age of onset: 34) in her maternal aunt; Heart failure in her mother.    Review of Systems: Review of Systems  Constitutional: Positive for weight loss.  Respiratory: Negative.   Cardiovascular: Negative.    Gastrointestinal: Negative.   Musculoskeletal: Negative.   Neurological: Negative.   Psychiatric/Behavioral: Negative.   All other systems reviewed and are negative.    PHYSICAL EXAM: VS:  BP 120/60 (BP Location: Left Arm, Patient Position: Sitting, Cuff Size: Normal)   Pulse 67   Ht 5\' 6"  (1.676 m)   Wt 191 lb 8 oz (86.9 kg)   BMI 30.91 kg/m  , BMI Body mass index is 30.91 kg/m. GEN: Well nourished, well developed, in no acute distress  HEENT: normal  Neck: no JVD, carotid bruits, or masses Cardiac: RRR; no murmurs, rubs, or gallops,no edema  Respiratory:  clear to auscultation bilaterally, normal work of breathing GI: soft, nontender, nondistended, + BS MS: no deformity or atrophy  Skin: warm and dry, no rash Neuro:  Strength and sensation are intact Psych: euthymic mood, full affect    Recent Labs: 03/19/2016: ALT 10; TSH 2.13 04/30/2016: Hemoglobin 9.5; Platelets 199 06/03/2016: BUN 15; Creatinine, Ser 1.61; Potassium 4.6; Sodium 141    Lipid Panel Lab Results  Component Value Date   CHOL 179 03/19/2016   HDL 39.10 03/19/2016   LDLCALC 118 (H) 03/19/2016   TRIG 110.0 03/19/2016      Wt Readings from Last 3 Encounters:  08/13/16 191 lb 8 oz (86.9 kg)  08/06/16 197 lb 8 oz (89.6 kg)  07/31/16 197 lb 1.9 oz (89.4 kg)       ASSESSMENT AND PLAN:   Cardiac device in situ device change out by Dr. Caryl Comes optiviol Recently elevated through March We've recommended she decrease her fluid intake, increase her Lasix.  optivol measurement back down to normal, Improved shortness of breath  Pure hypercholesterolemia Tolerating Crestor 40 mg daily  Atrial fibrillation, unspecified type (HCC) On anticoagulation  Bilateral carotid artery stenosis Last evaluated 06/2016 Stable 40-59% disease on the left, report reviewed by myself  Essential hypertension Blood pressure is well controlled on today's visit. No changes made to the medications.  Chronic diastolic  CHF (congestive heart failure) (HCC) Goal weight 188  At home (Weight 191 in the office today) Continue lasix 2 in the AM and one in the PM For weight of 185, decrease lasix dose to 1 pill twice a day For weight 193, increase lasix up to 2 in the Am and 2 in the PM  Pulmonary hypertension ([place]) Unable to tolerate revatio,  Tolerating more aggressive diuretic regimen    Total encounter time more than 25 minutes  Greater than 50% was spent in counseling and coordination of care with the patient   Disposition:   F/U  6 months    Signed, Esmond Plants, M.D., Ph.D. 08/13/2016  [place], [place]

## 2016-08-12 ENCOUNTER — Ambulatory Visit (INDEPENDENT_AMBULATORY_CARE_PROVIDER_SITE_OTHER): Payer: Self-pay

## 2016-08-12 DIAGNOSIS — Z95810 Presence of automatic (implantable) cardiac defibrillator: Secondary | ICD-10-CM

## 2016-08-12 DIAGNOSIS — I5032 Chronic diastolic (congestive) heart failure: Secondary | ICD-10-CM

## 2016-08-12 NOTE — Progress Notes (Signed)
EPIC Encounter for ICM Monitoring  Patient Name: [name] is a 83 y.o. female Date: 08/12/2016 Primary Care Physican: [name], MD Primary Cardiologist: Rockey Situ Electrophysiologist: Faustino Congress Weight:unknown - does not weigh       Heart Failure questions reviewed, pt is taking 3rd round of antibiotic for UTI and feeling better.  Ankle swelling has improved.    Thoracic impedance returned to normal since 08/07/2016 and taking Furosemide 2 tablets in am and 1 tablet in pm.  Prescribed dosage: Furosemide 40 mg 1 tablet 2-3 times a day. She does not always take 3 tablets daily as prescribed.  Labs: 06/03/2016 Creatinine 1.61, BUN 15, Potassium 4.6, Sodium 141,  EGFR 30-35 10/26/2017Creatinine 1.63, BUN 19, Potassium 3.5, Sodium 139, EGFR 29-33  03/19/2016 Creatinine 1.59, BUN 20, Potassium 4.1, Sodium 141  02/12/2016 Creatinine 1.70, BUN 20, Potassium 3.8, Sodium 139  12/26/2015 Creatinine 1.54, BUN 17, Potassium 4.0, Sodium 139, EGFR 31-36  07/27/2015 Creatinine 1.30, BUN 14, Potassium 4.9, Sodium 143, EGFR 39-45   Recommendations: No changes. Discussed to limit salt intake to 2000 mg/day and fluid intake to < 2 liters/day.  Encouraged to call for fluid symptoms.  Follow-up plan: ICM clinic phone appointment on 09/13/2016.  Office appointment scheduled on 08/13/2016 with Dr Rockey Situ and with Dr Caryl Comes.  Copy of ICM check sent to primary cardiologist and device physician.   3 month ICM trend: 08/12/2016   1 Year ICM trend:      Rosalene Billings, RN 08/12/2016 7:50 AM

## 2016-08-13 ENCOUNTER — Encounter: Payer: Self-pay | Admitting: Internal Medicine

## 2016-08-13 ENCOUNTER — Ambulatory Visit (INDEPENDENT_AMBULATORY_CARE_PROVIDER_SITE_OTHER): Payer: PPO

## 2016-08-13 ENCOUNTER — Ambulatory Visit (INDEPENDENT_AMBULATORY_CARE_PROVIDER_SITE_OTHER): Payer: PPO | Admitting: Cardiovascular Disease

## 2016-08-13 ENCOUNTER — Encounter: Payer: Self-pay | Admitting: Cardiovascular Disease

## 2016-08-13 ENCOUNTER — Ambulatory Visit (INDEPENDENT_AMBULATORY_CARE_PROVIDER_SITE_OTHER): Payer: PPO | Admitting: Internal Medicine

## 2016-08-13 VITALS — BP 120/60 | HR 67 | Ht 66.0 in | Wt 191.5 lb

## 2016-08-13 DIAGNOSIS — I272 Pulmonary hypertension, unspecified: Secondary | ICD-10-CM | POA: Diagnosis not present

## 2016-08-13 DIAGNOSIS — E782 Mixed hyperlipidemia: Secondary | ICD-10-CM

## 2016-08-13 DIAGNOSIS — I480 Paroxysmal atrial fibrillation: Secondary | ICD-10-CM

## 2016-08-13 DIAGNOSIS — I739 Peripheral vascular disease, unspecified: Secondary | ICD-10-CM | POA: Diagnosis not present

## 2016-08-13 DIAGNOSIS — I481 Persistent atrial fibrillation: Secondary | ICD-10-CM

## 2016-08-13 DIAGNOSIS — I4819 Other persistent atrial fibrillation: Secondary | ICD-10-CM

## 2016-08-13 DIAGNOSIS — Z95810 Presence of automatic (implantable) cardiac defibrillator: Secondary | ICD-10-CM

## 2016-08-13 DIAGNOSIS — I428 Other cardiomyopathies: Secondary | ICD-10-CM

## 2016-08-13 DIAGNOSIS — I5032 Chronic diastolic (congestive) heart failure: Secondary | ICD-10-CM

## 2016-08-13 DIAGNOSIS — J432 Centrilobular emphysema: Secondary | ICD-10-CM

## 2016-08-13 DIAGNOSIS — Z79899 Other long term (current) drug therapy: Secondary | ICD-10-CM

## 2016-08-13 DIAGNOSIS — I495 Sick sinus syndrome: Secondary | ICD-10-CM

## 2016-08-13 DIAGNOSIS — I6523 Occlusion and stenosis of bilateral carotid arteries: Secondary | ICD-10-CM | POA: Diagnosis not present

## 2016-08-13 DIAGNOSIS — N184 Chronic kidney disease, stage 4 (severe): Secondary | ICD-10-CM | POA: Diagnosis not present

## 2016-08-13 DIAGNOSIS — I442 Atrioventricular block, complete: Secondary | ICD-10-CM

## 2016-08-13 DIAGNOSIS — I10 Essential (primary) hypertension: Secondary | ICD-10-CM | POA: Diagnosis not present

## 2016-08-13 DIAGNOSIS — Z7902 Long term (current) use of antithrombotics/antiplatelets: Secondary | ICD-10-CM

## 2016-08-13 DIAGNOSIS — R600 Localized edema: Secondary | ICD-10-CM | POA: Diagnosis not present

## 2016-08-13 LAB — POCT INR: INR: 2.3

## 2016-08-13 NOTE — Patient Instructions (Addendum)
Medication Instructions:   Goal weight 188 Continue lasix 2 in the AM and one in the PM For weight of 185, decrease lasix dose to 1 pill twice a day For weight 193, increase lasix up to 2 in the Am and 2 in the PM  Labwork:  No new labs needed  Testing/Procedures:  No further testing at this time   I recommend watching educational videos on topics of interest to you at:       [email or web address]  Enter code: [email or web address]    Follow-Up: It was a pleasure seeing you in the office today. Please call us if you have new issues that need to be addressed before your next appt.  [phone]  Your physician wants you to follow-up in: 6 months.  You will receive a reminder letter in the mail two months in advance. If you don't receive a letter, please call our office to schedule the follow-up appointment.  If you need a refill on your cardiac medications before your next appointment, please call your pharmacy.

## 2016-08-13 NOTE — Patient Instructions (Signed)
Medication Instructions: - Your physician recommends that you continue on your current medications as directed. Please refer to the Current Medication list given to you today.  Labwork: - none ordered  Procedures/Testing: - none ordered  Follow-Up: - Remote monitoring is used to monitor your Pacemaker of ICD from home. This monitoring reduces the number of office visits required to check your device to one time per year. It allows [place] to keep an eye on the functioning of your device to ensure it is working properly. You are scheduled for a device check from home on 11/12/16. You may send your transmission at any time that day. If you have a wireless device, the transmission will be sent automatically. After your physician reviews your transmission, you will receive a postcard with your next transmission date.  - Your physician wants you to follow-up in: 6 months with Dr. Caryl Comes. You will receive a reminder letter in the mail two months in advance. If you don't receive a letter, please call our office to schedule the follow-up appointment.   Any Additional Special Instructions Will Be Listed Below (If Applicable).     If you need a refill on your cardiac medications before your next appointment, please call your pharmacy.

## 2016-08-13 NOTE — Patient Instructions (Signed)
Pre visit review using our clinic review tool, if applicable. No additional management support is needed unless otherwise documented below in the visit note. 

## 2016-08-13 NOTE — Progress Notes (Signed)
Patient Care Team: Lucille Passy, MD as PCP - General Minna Merritts, MD as Consulting Physician (Cardiology) Deboraha Sprang, MD as Consulting Physician (Cardiology) Katha Cabal, MD as Consulting Physician (Vascular Surgery)   HPI  [name] is a 83 y.o. female Seen in followup for nonischemic cardiomyopathy for which she has a primary prevention ICD implanted initially 2006 with generator replacement about 5 years ago and replacement again 10/17   There had been puffiness and concern re infection;  cultures at change out were neg     She has paroxysmal atrial fibrillation; previously  treated with disopyramide changed to  flecainide. But 2/2 side effects  back on disopyramide. ( the d/c summary 4/16 was WRONG-describing the use of amiodarone)  She's had infrequent Afib.  She has a CHADS-VASc score of greater than or equal to 6 (age-60, gender-1, TIA-2 (4/16), heart failure-1)   Unfortunately she has had urinary tract infection with Escherichia coli. She has not felt good since this all started. She did not have chills and/or rigors. However, following treatment she ended up having to be retreated. She is finishing her dose of therapy currently and  still does not feel good  She's had problems with intermittent peripheral edema   DATE TEST    1/15    Echo   EF 65 % Mod Pulm HTN, TR and Mild MR   4/16    Echo   EF 65 % Mod-sev PulmHTN with RVE, Mod MR severe TR  6/17 Echo EF 65% Mild-mod MR     Cath 2006 without obstructive disease    Date      3/17    Cr  1.3      K   4.9     12/17    Cr  1.63      K   3.5 Hgb 9.5    She has not felt well since the device was replaced   She has struggled with dyspnea and edema  Her fluid intake is copious, > 1/2--3/4 gallon/day with lots of salt;  Good response to diuretics      Past Medical History:  Diagnosis Date  . Adjustment disorder with anxiety   . Anginal pain ([place])   . Arthritis    "some in my hands"  (11/11/2012)  . Atrial fibrillation ([place])   . Automatic implantable cardioverter-defibrillator in situ   . Blood in stool   . Carotid artery stenosis 09/2007   60-79% bilateral (stable)  . Carotid artery stenosis 10/2008   40-59% R 60-79%   . Chest pain, unspecified   . Chronic airway obstruction, not elsewhere classified   . Chronic bronchitis ([place])    "get it some; not q year" (11/11/2012)  . Chronic diastolic CHF (congestive heart failure) ([place]) 06/04/2013  . Chronic kidney disease, unspecified   . Coronary artery disease    non-obstructive by 2006 cath  . Dysuria   . Edema   . Encounter for therapeutic drug monitoring   . Exertional shortness of breath   . Frequent UTI    "get them a couple times/yr" (11/11/2012)  . Hemorrhage of rectum and anus   . High cholesterol   . History of blood transfusion 04/2011   "after hip OR" (11/11/2012)  . Hypertension 05/20/2011  . Hypertr obst cardiomyop   . Hypotension, unspecified    cardiac cath 2006..nonobstructive CAD 30-40s lesions.Marland KitchenETT 1/09 nondiagnostic due to poor HR response..Right Renal Cancer 2003  .  Long term (current) use of anticoagulants   . Malignant neoplasm of kidney, except pelvis   . Osteoarthritis of right hip   . Other acute sinusitis   . Other and unspecified coagulation defects   . Other malaise and fatigue   . Other screening mammogram   . PONV (postoperative nausea and vomiting)   . Pre-operative cardiovascular examination   . Renal cancer ([place]) 06/2001   Right  . Secondary cardiomyopathy, unspecified   . Sinus bradycardia   . Special screening for osteoporosis   . Urge incontinence     Past Surgical History:  Procedure Laterality Date  . ABDOMINAL HYSTERECTOMY  1975   for benign causes  . APPENDECTOMY    . BI-VENTRICULAR PACEMAKER UPGRADE  05/04/2010  . CARDIAC CATHETERIZATION  2006  . CATARACT EXTRACTION W/ INTRAOCULAR LENS  IMPLANT, BILATERAL  01/2006-02-2006  . CHOLECYSTECTOMY N/A 11/11/2012   Procedure:  LAPAROSCOPIC CHOLECYSTECTOMY WITH INTRAOPERATIVE CHOLANGIOGRAM;  Surgeon: Imogene Burn. Georgette Dover, MD;  Location: [facility];  Service: General;  Laterality: N/A;  . EP IMPLANTABLE DEVICE N/A 02/21/2016   Procedure: ICD Generator Changeout;  Surgeon: Deboraha Sprang, MD;  Location: [facility];  Service: Cardiovascular;  Laterality: N/A;  . INSERT / REPLACE / REMOVE PACEMAKER  05-01-11   02-28-05-/05-04-10-ICD-MEDTRONIC MAXIMAL DR  . JOINT REPLACEMENT    . LAPAROSCOPIC CHOLECYSTECTOMY  11/11/2012  . LAPAROSCOPIC LYSIS OF ADHESIONS N/A 11/11/2012   Procedure: LAPAROSCOPIC LYSIS OF ADHESIONS;  Surgeon: Imogene Burn. Georgette Dover, MD;  Location: [facility];  Service: General;  Laterality: N/A;  . NEPHRECTOMY Right 06/2001    S/P RENAL CELL CANCER  . TOTAL HIP ARTHROPLASTY Right 05/03/2011   Procedure: TOTAL HIP ARTHROPLASTY ANTERIOR APPROACH;  Surgeon: Mcarthur Rossetti;  [facility];  Service: Orthopedics;  Laterality: Right;  Removal of Cannulated Screws Right Hip, Right Direct Anterior Hip Replacement    Current Outpatient Prescriptions  Medication Sig Dispense Refill  . acetaminophen (TYLENOL) 500 MG tablet Take 1,000 mg by mouth every 6 (six) hours as needed for headache.    . benzonatate (TESSALON) 200 MG capsule Take 1 capsule (200 mg total) by mouth 2 (two) times daily as needed for cough. 30 capsule 0  . carvedilol (COREG) 12.5 MG tablet Take 1 tablet (12.5 mg total) by mouth 2 (two) times daily with a meal. 60 tablet 10  . cephALEXin (KEFLEX) 500 MG capsule Take 1 capsule (500 mg total) by mouth 2 (two) times daily. 14 capsule 0  . cetirizine (ZYRTEC) 10 MG tablet Take 10 mg by mouth daily.     . disopyramide (NORPACE) 150 MG capsule TAKE 1 CAPSULE BY MOUTH TWICE DAILY 60 capsule 3  . fluticasone (FLONASE) 50 MCG/ACT nasal spray Place 2 sprays into both nostrils daily. 16 g 6  . furosemide (LASIX) 40 MG tablet Take 1 tablet (40 mg total) by mouth 3 (three) times daily. 90 tablet 10  . rosuvastatin  (CRESTOR) 40 MG tablet Take 1 tablet (40 mg total) by mouth daily. 90 tablet 3  . sildenafil (REVATIO) 20 MG tablet Take 1 tablet (20 mg total) by mouth 3 (three) times daily. 90 tablet 6  . warfarin (COUMADIN) 2 MG tablet TAKE AS DIRECTED BY MOUTH ANTI-COAGULATION CLINIC 120 tablet 3   No current facility-administered medications for this visit.     Allergies  Allergen Reactions  . Codeine Nausea And Vomiting  . Morphine And Related Nausea And Vomiting  . Sulfonamide Derivatives Other (See Comments)    REACTION:  Dry mouth    Review of Systems negative except from HPI and PMH  Physical Exam BP 120/60 (BP Location: Left Arm, Patient Position: Sitting, Cuff Size: Normal)   Pulse 67   Ht 5\' 6"  (1.676 m)   Wt 191 lb 8 oz (86.9 kg)   BMI 30.91 kg/m  Well developed and well nourished in no acute distress HENT normal E scleral and icterus clear Neck Supple JVP flat; carotids brisk and full Device pocket well healed; wihtout hematoma but with considerable swelling  There is no tethering   no erythema in the incision line of her device generator replacement Clear to ausculation Regular rate and rhythm, 2/6 murmur  Soft with active bowel sounds No clubbing cyanosis 1-  Edema Alert and oriented, grossly normal motor and sensory function Skin Warm and Dry  ECG  Personally reviewed  demonstrates  AV pacing @67  16/17/51  Assessment and  Plan  Nonischemic cardiomyopathy with interval normalization of LV function  Sinus node dysfunction device dependent   Complete heart block intermittent  Atrial fibrillation persistent  High risk medication surveillance-disopyramide  Device/pocket related chest pain    Implantable defibrillator   Her pocket remains a concern. All the cultures were negative at generator replacement.  I'm concerned about the urinary tract infection and her lack of full recovery. It raises the possibility of bacteremia and secondary seeding of the  defibrillator. This is relatively uncommon with Escherichia coli probably 5-10% of the time. However, if her symptoms do not fully resolve, prior to re-initiation of antibiotics I would ask Dr. Marjory Lies to get 2 blood cultures.   The fact that she is having ongoing pocket discomfort also raises the possibility of a device pocket infection notwithstanding the lack of signs area the fact that her pocket feels better is encouraging; the fact that it feels better in the context of antibiotics mitigates any comfort.  She's having problems with congestive heart failure. We have reviewed salt and fluid and use of her diuretics.  More than 50% of 45 min was spent in counseling related to the above \

## 2016-08-14 ENCOUNTER — Encounter: Payer: Self-pay | Admitting: Family Medicine

## 2016-08-14 ENCOUNTER — Ambulatory Visit (INDEPENDENT_AMBULATORY_CARE_PROVIDER_SITE_OTHER): Payer: PPO | Admitting: Family Medicine

## 2016-08-14 VITALS — BP 168/68 | HR 82 | Temp 97.3°F | Wt 190.0 lb

## 2016-08-14 DIAGNOSIS — Z950 Presence of cardiac pacemaker: Secondary | ICD-10-CM

## 2016-08-14 DIAGNOSIS — R300 Dysuria: Secondary | ICD-10-CM

## 2016-08-14 DIAGNOSIS — I5032 Chronic diastolic (congestive) heart failure: Secondary | ICD-10-CM | POA: Diagnosis not present

## 2016-08-14 DIAGNOSIS — R531 Weakness: Secondary | ICD-10-CM

## 2016-08-14 DIAGNOSIS — N390 Urinary tract infection, site not specified: Secondary | ICD-10-CM

## 2016-08-14 NOTE — Patient Instructions (Signed)
Your urine test looks much better I will notify you of blood culture results- it may take 5-10 days for the results to come in.

## 2016-08-14 NOTE — Progress Notes (Signed)
Subjective:    Patient ID: [name], female    DOB: 1934/02/24, 83 y.o.   MRN: [id number]  HPI This is an 83 yo female who presents today with dysuria x 1 month. She saw Dr. Deborra Medina 8 days ago and was prescribed cipro. Urine culture returned showing e coli with resistance to cipro and she was changed to keflex. She will finish keflex in 2 days. She had low grade fever to 99 last week, hasn't taken her temperature this weeks. Thinks symptoms may be improved slightly.  She saw Dr. Caryl Comes yesterday and he suggested obtaining 2 blood culture samples if persistent UTI. She had her pacemaker changed out at the end of 2017 and has had persistent site pain. Dr. Caryl Comes concerned for infection.   Past Medical History:  Diagnosis Date  . Adjustment disorder with anxiety   . Anginal pain ([place])   . Arthritis    "some in my hands" (11/11/2012)  . Atrial fibrillation ([place])   . Automatic implantable cardioverter-defibrillator in situ   . Blood in stool   . Carotid artery stenosis 09/2007   60-79% bilateral (stable)  . Carotid artery stenosis 10/2008   40-59% R 60-79%   . Chest pain, unspecified   . Chronic airway obstruction, not elsewhere classified   . Chronic bronchitis ([place])    "get it some; not q year" (11/11/2012)  . Chronic diastolic CHF (congestive heart failure) ([place]) 06/04/2013  . Chronic kidney disease, unspecified   . Coronary artery disease    non-obstructive by 2006 cath  . Dysuria   . Edema   . Encounter for therapeutic drug monitoring   . Exertional shortness of breath   . Frequent UTI    "get them a couple times/yr" (11/11/2012)  . Hemorrhage of rectum and anus   . High cholesterol   . History of blood transfusion 04/2011   "after hip OR" (11/11/2012)  . Hypertension 05/20/2011  . Hypertr obst cardiomyop   . Hypotension, unspecified    cardiac cath 2006..nonobstructive CAD 30-40s lesions.Marland KitchenETT 1/09 nondiagnostic due to poor HR response..Right Renal Cancer 2003  . Long term  (current) use of anticoagulants   . Malignant neoplasm of kidney, except pelvis   . Osteoarthritis of right hip   . Other acute sinusitis   . Other and unspecified coagulation defects   . Other malaise and fatigue   . Other screening mammogram   . PONV (postoperative nausea and vomiting)   . Pre-operative cardiovascular examination   . Renal cancer ([place]) 06/2001   Right  . Secondary cardiomyopathy, unspecified   . Sinus bradycardia   . Special screening for osteoporosis   . Urge incontinence    Past Surgical History:  Procedure Laterality Date  . ABDOMINAL HYSTERECTOMY  1975   for benign causes  . APPENDECTOMY    . BI-VENTRICULAR PACEMAKER UPGRADE  05/04/2010  . CARDIAC CATHETERIZATION  2006  . CATARACT EXTRACTION W/ INTRAOCULAR LENS  IMPLANT, BILATERAL  01/2006-02-2006  . CHOLECYSTECTOMY N/A 11/11/2012   Procedure: LAPAROSCOPIC CHOLECYSTECTOMY WITH INTRAOPERATIVE CHOLANGIOGRAM;  Surgeon: Imogene Burn. Georgette Dover, MD;  Location: [facility];  Service: General;  Laterality: N/A;  . EP IMPLANTABLE DEVICE N/A 02/21/2016   Procedure: ICD Generator Changeout;  Surgeon: Deboraha Sprang, MD;  Location: [facility];  Service: Cardiovascular;  Laterality: N/A;  . INSERT / REPLACE / REMOVE PACEMAKER  05-01-11   02-28-05-/05-04-10-ICD-MEDTRONIC MAXIMAL DR  . JOINT REPLACEMENT    . LAPAROSCOPIC CHOLECYSTECTOMY  11/11/2012  . LAPAROSCOPIC  LYSIS OF ADHESIONS N/A 11/11/2012   Procedure: LAPAROSCOPIC LYSIS OF ADHESIONS;  Surgeon: Imogene Burn. Georgette Dover, MD;  Location: [facility];  Service: General;  Laterality: N/A;  . NEPHRECTOMY Right 06/2001    S/P RENAL CELL CANCER  . TOTAL HIP ARTHROPLASTY Right 05/03/2011   Procedure: TOTAL HIP ARTHROPLASTY ANTERIOR APPROACH;  Surgeon: Mcarthur Rossetti;  [facility];  Service: Orthopedics;  Laterality: Right;  Removal of Cannulated Screws Right Hip, Right Direct Anterior Hip Replacement   Family History  Problem Relation Age of Onset  . Heart failure Mother   . Breast  cancer Maternal Aunt 82  . Breast cancer Cousin   . Colon cancer Neg Hx    Social History  Substance Use Topics  . Smoking status: Former Smoker    Packs/day: 0.50    Years: 40.00    Types: Cigarettes    Quit date: 05/06/2001  . Smokeless tobacco: Never Used  . Alcohol use No      Review of Systems Per HPI    Objective:   Physical Exam  Constitutional: She is oriented to person, place, and time. She appears well-developed and well-nourished. No distress.  Cardiovascular: Normal rate, regular rhythm and normal heart sounds.   Pulmonary/Chest: Effort normal and breath sounds normal.  Abdominal: Soft. She exhibits no distension. There is no tenderness. There is no rebound, no guarding and no CVA tenderness.  Musculoskeletal: She exhibits no edema.  Neurological: She is alert and oriented to person, place, and time.  Skin: Skin is warm and dry. She is not diaphoretic.  Psychiatric: She has a normal mood and affect. Her behavior is normal. Judgment and thought content normal.  Vitals reviewed.     BP (!) 168/68 (BP Location: Right Arm, Patient Position: Sitting, Cuff Size: Normal)   Pulse 82   Temp 97.3 F (36.3 C) (Oral)   Wt 190 lb (86.2 kg)   SpO2 97%   BMI 30.67 kg/m  Wt Readings from Last 3 Encounters:  08/14/16 190 lb (86.2 kg)  08/13/16 191 lb 8 oz (86.9 kg)  08/13/16 191 lb 8 oz (86.9 kg)   BP Readings from Last 3 Encounters:  08/14/16 (!) 168/68  08/13/16 120/60  08/13/16 120/60       Assessment & Plan:  1. Chronic diastolic CHF (congestive heart failure) (HCC) - discussed concern for infection and reviewed Dr. Olin Pia note, will go ahead and obtain blood cultures x 2, patient agrees and is somewhat anxious to rule out infection - Culture, blood (single) w Reflex to ID Panel - Culture, blood (single) w Reflex to ID Panel  2. Weakness - Culture, blood (single) w Reflex to ID Panel - Culture, blood (single) w Reflex to ID Panel  3. Artificial cardiac  pacemaker - Culture, blood (single) w Reflex to ID Panel - Culture, blood (single) w Reflex to ID Panel  4. Dysuria - POCT Urinalysis Dipstick (Automated)  5. Chronic UTI - urine dip looks good today - finish keflex   Clarene Reamer, FNP-BC  [facility]  08/16/2016 10:44 AM

## 2016-08-14 NOTE — Progress Notes (Signed)
Pre visit review using our clinic review tool, if applicable. No additional management support is needed unless otherwise documented below in the visit note. 

## 2016-08-16 LAB — POC URINALSYSI DIPSTICK (AUTOMATED)
Bilirubin, UA: NEGATIVE
Blood, UA: NEGATIVE
Glucose, UA: NEGATIVE
Ketones, UA: NEGATIVE
Leukocytes, UA: NEGATIVE
Nitrite, UA: NEGATIVE
Protein, UA: NEGATIVE
Spec Grav, UA: 1.02 (ref 1.010–1.025)
Urobilinogen, UA: 0.2 E.U./dL
pH, UA: 6 (ref 5.0–8.0)

## 2016-08-19 ENCOUNTER — Other Ambulatory Visit: Payer: Self-pay | Admitting: Internal Medicine

## 2016-08-19 ENCOUNTER — Other Ambulatory Visit: Payer: Self-pay | Admitting: Family Medicine

## 2016-08-19 DIAGNOSIS — Z1231 Encounter for screening mammogram for malignant neoplasm of breast: Secondary | ICD-10-CM

## 2016-08-20 ENCOUNTER — Ambulatory Visit (INDEPENDENT_AMBULATORY_CARE_PROVIDER_SITE_OTHER): Payer: PPO

## 2016-08-20 DIAGNOSIS — Z7902 Long term (current) use of antithrombotics/antiplatelets: Secondary | ICD-10-CM | POA: Diagnosis not present

## 2016-08-20 LAB — CULTURE, BLOOD (SINGLE)
Organism ID, Bacteria: NO GROWTH
Organism ID, Bacteria: NO GROWTH

## 2016-08-20 LAB — POCT INR: INR: 2.5

## 2016-08-20 NOTE — Patient Instructions (Signed)
Pre visit review using our clinic review tool, if applicable. No additional management support is needed unless otherwise documented below in the visit note. 

## 2016-08-23 NOTE — Progress Notes (Signed)
ICM remote transmission rescheduled from 09/13/2016 to 09/16/2016.

## 2016-08-28 LAB — CUP PACEART INCLINIC DEVICE CHECK
Battery Remaining Longevity: 89 mo
Battery Voltage: 3.01 V
Brady Statistic AP VP Percent: 97.92 %
Brady Statistic AP VS Percent: 0.01 %
Brady Statistic AS VP Percent: 2.08 %
Brady Statistic AS VS Percent: 0 %
Brady Statistic RA Percent Paced: 97.87 %
Brady Statistic RV Percent Paced: 99.96 %
Date Time Interrogation Session: 20180410160043
HighPow Impedance: 38 Ohm
HighPow Impedance: 50 Ohm
Implantable Lead Implant Date: 20061026
Implantable Lead Implant Date: 20111230
Implantable Lead Location: 753859
Implantable Lead Location: 753860
Implantable Lead Model: 5076
Implantable Lead Model: 6947
Implantable Pulse Generator Implant Date: 20171018
Lead Channel Impedance Value: 399 Ohm
Lead Channel Impedance Value: 418 Ohm
Lead Channel Impedance Value: 456 Ohm
Lead Channel Pacing Threshold Amplitude: 0.75 V
Lead Channel Pacing Threshold Amplitude: 1.25 V
Lead Channel Pacing Threshold Pulse Width: 0.4 ms
Lead Channel Pacing Threshold Pulse Width: 0.4 ms
Lead Channel Setting Pacing Amplitude: 2.5 V
Lead Channel Setting Pacing Amplitude: 2.5 V
Lead Channel Setting Pacing Pulse Width: 0.4 ms
Lead Channel Setting Sensing Sensitivity: 0.3 mV

## 2016-09-03 ENCOUNTER — Other Ambulatory Visit: Payer: Self-pay | Admitting: Family Medicine

## 2016-09-03 ENCOUNTER — Ambulatory Visit (INDEPENDENT_AMBULATORY_CARE_PROVIDER_SITE_OTHER): Payer: PPO

## 2016-09-03 ENCOUNTER — Other Ambulatory Visit: Payer: Self-pay | Admitting: Internal Medicine

## 2016-09-03 DIAGNOSIS — Z7902 Long term (current) use of antithrombotics/antiplatelets: Secondary | ICD-10-CM

## 2016-09-03 DIAGNOSIS — E538 Deficiency of other specified B group vitamins: Secondary | ICD-10-CM

## 2016-09-03 LAB — POCT INR: INR: 2.9

## 2016-09-03 MED ORDER — CYANOCOBALAMIN 1000 MCG/ML IJ SOLN
1000.0000 ug | INTRAMUSCULAR | Status: DC
  Administered 2016-09-03 – 2017-03-21 (×4): 1000 ug via INTRAMUSCULAR

## 2016-09-03 NOTE — Patient Instructions (Signed)
Pre visit review using our clinic review tool, if applicable. No additional management support is needed unless otherwise documented below in the visit note. 

## 2016-09-12 ENCOUNTER — Encounter: Payer: Self-pay | Admitting: Family Medicine

## 2016-09-12 ENCOUNTER — Ambulatory Visit (INDEPENDENT_AMBULATORY_CARE_PROVIDER_SITE_OTHER): Payer: PPO | Admitting: Family Medicine

## 2016-09-12 VITALS — BP 136/70 | HR 64 | Temp 97.5°F | Wt 189.0 lb

## 2016-09-12 DIAGNOSIS — N3000 Acute cystitis without hematuria: Secondary | ICD-10-CM | POA: Diagnosis not present

## 2016-09-12 LAB — POC URINALSYSI DIPSTICK (AUTOMATED)
Bilirubin, UA: NEGATIVE
Glucose, UA: NEGATIVE
Ketones, UA: NEGATIVE
Nitrite, UA: POSITIVE
Protein, UA: 15
Spec Grav, UA: 1.01 (ref 1.010–1.025)
Urobilinogen, UA: 0.2 E.U./dL
pH, UA: 5 (ref 5.0–8.0)

## 2016-09-12 MED ORDER — CEPHALEXIN 500 MG PO CAPS: 500.0000 mg | ORAL_CAPSULE | Freq: Two times a day (BID) | ORAL | 0 refills | Status: DC

## 2016-09-12 NOTE — Progress Notes (Signed)
Subjective:   Patient ID: [name], female    DOB: 12-17-1933, 83 y.o.   MRN: [id number]  [name] is a pleasant 83 y.o. year old female who presents to clinic today with UTI Symptoms (Started 2 days ago with dysuria, hematuria, chills.)  on 09/12/2016  HPI:  H/o recurrent UTIs.  Last treated by me in 08/2016.  Initially started on cipro but urine cx grew e coli resistant to cipro and abx was changed to Keflex.  Symptoms returned 2 days ago with hematuria, dysuria and chills without fever.   Current Outpatient Prescriptions on File Prior to Visit  Medication Sig Dispense Refill  . acetaminophen (TYLENOL) 500 MG tablet Take 1,000 mg by mouth every 6 (six) hours as needed for headache.    . carvedilol (COREG) 12.5 MG tablet TAKE 1 TABLET BY MOUTH TWICE A DAY WITH A MEAL 60 tablet 6  . cetirizine (ZYRTEC) 10 MG tablet Take 10 mg by mouth daily.     . disopyramide (NORPACE) 150 MG capsule TAKE 1 CAPSULE BY MOUTH TWICE DAILY 60 capsule 3  . furosemide (LASIX) 40 MG tablet Take 1 tablet (40 mg total) by mouth 3 (three) times daily. 90 tablet 10  . rosuvastatin (CRESTOR) 40 MG tablet Take 1 tablet (40 mg total) by mouth daily. 90 tablet 3  . sildenafil (REVATIO) 20 MG tablet Take 1 tablet (20 mg total) by mouth 3 (three) times daily. 90 tablet 6  . warfarin (COUMADIN) 2 MG tablet TAKE BY MOUTH AS DIRECTED BY ANTI-COAGULATION CLINIC 120 tablet 0  . fluticasone (FLONASE) 50 MCG/ACT nasal spray Place 2 sprays into both nostrils daily. (Patient not taking: Reported on 09/12/2016) 16 g 6  . [DISCONTINUED] hydrochlorothiazide (HYDRODIURIL) 25 MG tablet Take 25 mg by mouth daily after breakfast.      Current Facility-Administered Medications on File Prior to Visit  Medication Dose Route Frequency Provider Last Rate Last Dose  . cyanocobalamin ((VITAMIN B-12)) injection 1,000 mcg  1,000 mcg Intramuscular Q30 days Lucille Passy, MD   1,000 mcg at 09/03/16 1545    Allergies    Allergen Reactions  . Codeine Nausea And Vomiting  . Morphine And Related Nausea And Vomiting  . Sulfonamide Derivatives Other (See Comments)    REACTION: Dry mouth    Past Medical History:  Diagnosis Date  . Adjustment disorder with anxiety   . Anginal pain ([place])   . Arthritis    "some in my hands" (11/11/2012)  . Atrial fibrillation ([place])   . Automatic implantable cardioverter-defibrillator in situ   . Blood in stool   . Carotid artery stenosis 09/2007   60-79% bilateral (stable)  . Carotid artery stenosis 10/2008   40-59% R 60-79%   . Chest pain, unspecified   . Chronic airway obstruction, not elsewhere classified   . Chronic bronchitis ([place])    "get it some; not q year" (11/11/2012)  . Chronic diastolic CHF (congestive heart failure) ([place]) 06/04/2013  . Chronic kidney disease, unspecified   . Coronary artery disease    non-obstructive by 2006 cath  . Dysuria   . Edema   . Encounter for therapeutic drug monitoring   . Exertional shortness of breath   . Frequent UTI    "get them a couple times/yr" (11/11/2012)  . Hemorrhage of rectum and anus   . High cholesterol   . History of blood transfusion 04/2011   "after hip OR" (11/11/2012)  . Hypertension 05/20/2011  . Hypertr obst cardiomyop   .  Hypotension, unspecified    cardiac cath 2006..nonobstructive CAD 30-40s lesions.Marland KitchenETT 1/09 nondiagnostic due to poor HR response..Right Renal Cancer 2003  . Long term (current) use of anticoagulants   . Malignant neoplasm of kidney, except pelvis   . Osteoarthritis of right hip   . Other acute sinusitis   . Other and unspecified coagulation defects   . Other malaise and fatigue   . Other screening mammogram   . PONV (postoperative nausea and vomiting)   . Pre-operative cardiovascular examination   . Renal cancer ([place]) 06/2001   Right  . Secondary cardiomyopathy, unspecified   . Sinus bradycardia   . Special screening for osteoporosis   . Urge incontinence     Past Surgical History:   Procedure Laterality Date  . ABDOMINAL HYSTERECTOMY  1975   for benign causes  . APPENDECTOMY    . BI-VENTRICULAR PACEMAKER UPGRADE  05/04/2010  . CARDIAC CATHETERIZATION  2006  . CATARACT EXTRACTION W/ INTRAOCULAR LENS  IMPLANT, BILATERAL  01/2006-02-2006  . CHOLECYSTECTOMY N/A 11/11/2012   Procedure: LAPAROSCOPIC CHOLECYSTECTOMY WITH INTRAOPERATIVE CHOLANGIOGRAM;  Surgeon: Imogene Burn. Georgette Dover, MD;  Location: [facility];  Service: General;  Laterality: N/A;  . EP IMPLANTABLE DEVICE N/A 02/21/2016   Procedure: ICD Generator Changeout;  Surgeon: Deboraha Sprang, MD;  Location: [facility];  Service: Cardiovascular;  Laterality: N/A;  . INSERT / REPLACE / REMOVE PACEMAKER  05-01-11   02-28-05-/05-04-10-ICD-MEDTRONIC MAXIMAL DR  . JOINT REPLACEMENT    . LAPAROSCOPIC CHOLECYSTECTOMY  11/11/2012  . LAPAROSCOPIC LYSIS OF ADHESIONS N/A 11/11/2012   Procedure: LAPAROSCOPIC LYSIS OF ADHESIONS;  Surgeon: Imogene Burn. Georgette Dover, MD;  Location: [facility];  Service: General;  Laterality: N/A;  . NEPHRECTOMY Right 06/2001    S/P RENAL CELL CANCER  . TOTAL HIP ARTHROPLASTY Right 05/03/2011   Procedure: TOTAL HIP ARTHROPLASTY ANTERIOR APPROACH;  Surgeon: Mcarthur Rossetti;  [facility];  Service: Orthopedics;  Laterality: Right;  Removal of Cannulated Screws Right Hip, Right Direct Anterior Hip Replacement    Family History  Problem Relation Age of Onset  . Heart failure Mother   . Breast cancer Maternal Aunt 82  . Breast cancer Cousin   . Colon cancer Neg Hx     Social History   Social History  . Marital status: Widowed    Spouse name: N/A  . Number of children: N/A  . Years of education: N/A   Occupational History  . Retired Retired   Social History Main Topics  . Smoking status: Former Smoker    Packs/day: 0.50    Years: 40.00    Types: Cigarettes    Quit date: 05/06/2001  . Smokeless tobacco: Never Used  . Alcohol use No  . Drug use: No  . Sexual activity: No   Other Topics Concern  .  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   ALCOHOL USE-NO   RETIRED   FORMER TOBACCO USE.....1/2 PACK X 39 YRS UNTIL 2003   Would desire CPR   The PMH, PSH, Social History, Family History, Medications, and allergies have been reviewed in [facility], and have been updated if relevant.     Review of Systems  Constitutional: Positive for chills. Negative for fever.  Gastrointestinal: Negative.   Genitourinary: Positive for dysuria, frequency, hematuria and urgency. Negative for  decreased urine volume, difficulty urinating, dyspareunia, enuresis, flank pain, pelvic pain, vaginal bleeding, vaginal discharge and vaginal pain.  Musculoskeletal: Negative.   Neurological: Negative.   Hematological: Negative.   Psychiatric/Behavioral: Negative.   All other systems reviewed and are negative.      Objective:    BP 136/70 (BP Location: Left Arm, Patient Position: Sitting, Cuff Size: Large)   Pulse 64   Temp 97.5 F (36.4 C) (Oral)   Wt 189 lb (85.7 kg)   SpO2 97%   BMI 30.51 kg/m   UA pos for 3+ LE, Nitrites and 3+ blood  Physical Exam  Constitutional: She is oriented to person, place, and time. She appears well-developed and well-nourished. No distress.  HENT:  Head: Normocephalic and atraumatic.  Eyes: Conjunctivae are normal.  Cardiovascular: Normal rate.   Pulmonary/Chest: Effort normal.  Abdominal: Soft. Bowel sounds are normal. She exhibits no distension. There is no tenderness. There is no rebound and no guarding.  Musculoskeletal: Normal range of motion.  Neurological: She is alert and oriented to person, place, and time.  Skin: Skin is warm and dry. She is not diaphoretic.  Psychiatric: She has a normal mood and affect. Her behavior is normal. Judgment and thought content normal.  Nursing note and vitals  reviewed.         Assessment & Plan:   Acute cystitis without hematuria No Follow-up on file.

## 2016-09-12 NOTE — Patient Instructions (Signed)
Please take Keflex as directed- 1 tablet twice daily x 7 days.  I do want you to schedule an INR recheck in a little over a week.

## 2016-09-12 NOTE — Assessment & Plan Note (Signed)
Recurrent- UA positive. Given recent urine cx recent to cipro, will treat again with 1 week course of Keflex 500 mg twice daily. Send urine again for cx. Advised to make an appt for INR check in approximately a week and a half.

## 2016-09-12 NOTE — Progress Notes (Signed)
Pre visit review using our clinic review tool, if applicable. No additional management support is needed unless otherwise documented below in the visit note. 

## 2016-09-14 LAB — URINE CULTURE

## 2016-09-16 ENCOUNTER — Telehealth: Payer: Self-pay

## 2016-09-16 ENCOUNTER — Ambulatory Visit (INDEPENDENT_AMBULATORY_CARE_PROVIDER_SITE_OTHER): Payer: PPO

## 2016-09-16 DIAGNOSIS — I5032 Chronic diastolic (congestive) heart failure: Secondary | ICD-10-CM

## 2016-09-16 DIAGNOSIS — Z95810 Presence of automatic (implantable) cardiac defibrillator: Secondary | ICD-10-CM

## 2016-09-16 NOTE — Progress Notes (Signed)
EPIC Encounter for ICM Monitoring  Patient Name: [name] is a 83 y.o. female Date: 09/16/2016 Primary Care Physican: Lucille Passy, MD Primary Cardiologist: Rockey Situ Electrophysiologist: Faustino Congress Weight:unknown - does not weigh  Attempted call to patient and unable to reach.   Transmission reviewed.    Thoracic impedance normal.  Prescribed dosage: Furosemide 40 mg 1 tablet 2-3 times a day. Does not always take 3 tablets daily as prescribed.  Labs: 06/03/2016 Creatinine 1.61, BUN 15, Potassium 4.6, Sodium 141, EGFR 30-35 10/26/2017Creatinine 1.63, BUN 19, Potassium 3.5, Sodium 139, EGFR 29-33  03/19/2016 Creatinine 1.59, BUN 20, Potassium 4.1, Sodium 141  02/12/2016 Creatinine 1.70, BUN 20, Potassium 3.8, Sodium 139  12/26/2015 Creatinine 1.54, BUN 17, Potassium 4.0, Sodium 139, EGFR 31-36  07/27/2015 Creatinine 1.30, BUN 14, Potassium 4.9, Sodium 143, EGFR 39-45   Recommendations: NONE - Unable to reach patient   Follow-up plan: ICM clinic phone appointment on 10/17/2016.   .  Copy of ICM check sent to device physician.   3 month ICM trend: 09/16/2016   1 Year ICM trend:      Rosalene Billings, RN 09/16/2016 11:57 AM

## 2016-09-16 NOTE — Telephone Encounter (Signed)
Remote ICM transmission received.  Attempted patient call and no answering machine

## 2016-09-17 ENCOUNTER — Ambulatory Visit (INDEPENDENT_AMBULATORY_CARE_PROVIDER_SITE_OTHER): Payer: PPO | Admitting: Family Medicine

## 2016-09-17 VITALS — BP 140/70 | HR 67 | Temp 98.0°F | Wt 188.5 lb

## 2016-09-17 DIAGNOSIS — N3000 Acute cystitis without hematuria: Secondary | ICD-10-CM | POA: Diagnosis not present

## 2016-09-17 DIAGNOSIS — R6883 Chills (without fever): Secondary | ICD-10-CM

## 2016-09-17 LAB — POC URINALSYSI DIPSTICK (AUTOMATED)
Bilirubin, UA: NEGATIVE
Blood, UA: NEGATIVE
Glucose, UA: NEGATIVE
Ketones, UA: NEGATIVE
Leukocytes, UA: NEGATIVE
Nitrite, UA: NEGATIVE
Protein, UA: NEGATIVE
Spec Grav, UA: 1.015 (ref 1.010–1.025)
Urobilinogen, UA: 0.2 E.U./dL
pH, UA: 7 (ref 5.0–8.0)

## 2016-09-17 NOTE — Assessment & Plan Note (Signed)
Chills and other symptoms have resolved and UA neg. Reassurance provided. Call or return to clinic prn if these symptoms worsen or fail to improve as anticipated. The patient indicates understanding of these issues and agrees with the plan.

## 2016-09-17 NOTE — Progress Notes (Signed)
Subjective:   Patient ID: [name], female    DOB: 1933/07/31, 83 y.o.   MRN: [id number]  [name] is a pleasant 83 y.o. year old female who presents to clinic today with Chills  on 09/17/2016  HPI:  Saw her for UTI on 09/12/16. Note reviewed.  Symptoms including hematuria, dysuria and chills without fever.  Given recent urine cx growing E coli resistant to cipro, I placed her on Keflex 500 mg twice daily.  Urine cx from 09/12/16 grew E coli that was sensitive to cephalosporins (remained resistant to cipro).  She did have chills yesterday but now is feeling much better.  Dysuria and hematuria have resolved.   Current Outpatient Prescriptions on File Prior to Visit  Medication Sig Dispense Refill  . acetaminophen (TYLENOL) 500 MG tablet Take 1,000 mg by mouth every 6 (six) hours as needed for headache.    . carvedilol (COREG) 12.5 MG tablet TAKE 1 TABLET BY MOUTH TWICE A DAY WITH A MEAL 60 tablet 6  . cephALEXin (KEFLEX) 500 MG capsule Take 1 capsule (500 mg total) by mouth 2 (two) times daily. 14 capsule 0  . cetirizine (ZYRTEC) 10 MG tablet Take 10 mg by mouth daily.     . disopyramide (NORPACE) 150 MG capsule TAKE 1 CAPSULE BY MOUTH TWICE DAILY 60 capsule 3  . fluticasone (FLONASE) 50 MCG/ACT nasal spray Place 2 sprays into both nostrils daily. 16 g 6  . furosemide (LASIX) 40 MG tablet Take 1 tablet (40 mg total) by mouth 3 (three) times daily. 90 tablet 10  . rosuvastatin (CRESTOR) 40 MG tablet Take 1 tablet (40 mg total) by mouth daily. 90 tablet 3  . sildenafil (REVATIO) 20 MG tablet Take 1 tablet (20 mg total) by mouth 3 (three) times daily. 90 tablet 6  . warfarin (COUMADIN) 2 MG tablet TAKE BY MOUTH AS DIRECTED BY ANTI-COAGULATION CLINIC 120 tablet 0  . [DISCONTINUED] hydrochlorothiazide (HYDRODIURIL) 25 MG tablet Take 25 mg by mouth daily after breakfast.      Current Facility-Administered Medications on File Prior to Visit  Medication Dose Route  Frequency Provider Last Rate Last Dose  . cyanocobalamin ((VITAMIN B-12)) injection 1,000 mcg  1,000 mcg Intramuscular Q30 days Lucille Passy, MD   1,000 mcg at 09/03/16 1545    Allergies  Allergen Reactions  . Codeine Nausea And Vomiting  . Morphine And Related Nausea And Vomiting  . Sulfonamide Derivatives Other (See Comments)    REACTION: Dry mouth    Past Medical History:  Diagnosis Date  . Adjustment disorder with anxiety   . Anginal pain ([place])   . Arthritis    "some in my hands" (11/11/2012)  . Atrial fibrillation ([place])   . Automatic implantable cardioverter-defibrillator in situ   . Blood in stool   . Carotid artery stenosis 09/2007   60-79% bilateral (stable)  . Carotid artery stenosis 10/2008   40-59% R 60-79%   . Chest pain, unspecified   . Chronic airway obstruction, not elsewhere classified   . Chronic bronchitis ([place])    "get it some; not q year" (11/11/2012)  . Chronic diastolic CHF (congestive heart failure) ([place]) 06/04/2013  . Chronic kidney disease, unspecified   . Coronary artery disease    non-obstructive by 2006 cath  . Dysuria   . Edema   . Encounter for therapeutic drug monitoring   . Exertional shortness of breath   . Frequent UTI    "get them a couple times/yr" (11/11/2012)  .  Hemorrhage of rectum and anus   . High cholesterol   . History of blood transfusion 04/2011   "after hip OR" (11/11/2012)  . Hypertension 05/20/2011  . Hypertr obst cardiomyop   . Hypotension, unspecified    cardiac cath 2006..nonobstructive CAD 30-40s lesions.Marland KitchenETT 1/09 nondiagnostic due to poor HR response..Right Renal Cancer 2003  . Long term (current) use of anticoagulants   . Malignant neoplasm of kidney, except pelvis   . Osteoarthritis of right hip   . Other acute sinusitis   . Other and unspecified coagulation defects   . Other malaise and fatigue   . Other screening mammogram   . PONV (postoperative nausea and vomiting)   . Pre-operative cardiovascular examination   .  Renal cancer ([place]) 06/2001   Right  . Secondary cardiomyopathy, unspecified   . Sinus bradycardia   . Special screening for osteoporosis   . Urge incontinence     Past Surgical History:  Procedure Laterality Date  . ABDOMINAL HYSTERECTOMY  1975   for benign causes  . APPENDECTOMY    . BI-VENTRICULAR PACEMAKER UPGRADE  05/04/2010  . CARDIAC CATHETERIZATION  2006  . CATARACT EXTRACTION W/ INTRAOCULAR LENS  IMPLANT, BILATERAL  01/2006-02-2006  . CHOLECYSTECTOMY N/A 11/11/2012   Procedure: LAPAROSCOPIC CHOLECYSTECTOMY WITH INTRAOPERATIVE CHOLANGIOGRAM;  Surgeon: Imogene Burn. Georgette Dover, MD;  Location: [facility];  Service: General;  Laterality: N/A;  . EP IMPLANTABLE DEVICE N/A 02/21/2016   Procedure: ICD Generator Changeout;  Surgeon: Deboraha Sprang, MD;  Location: [facility];  Service: Cardiovascular;  Laterality: N/A;  . INSERT / REPLACE / REMOVE PACEMAKER  05-01-11   02-28-05-/05-04-10-ICD-MEDTRONIC MAXIMAL DR  . JOINT REPLACEMENT    . LAPAROSCOPIC CHOLECYSTECTOMY  11/11/2012  . LAPAROSCOPIC LYSIS OF ADHESIONS N/A 11/11/2012   Procedure: LAPAROSCOPIC LYSIS OF ADHESIONS;  Surgeon: Imogene Burn. Georgette Dover, MD;  Location: [facility];  Service: General;  Laterality: N/A;  . NEPHRECTOMY Right 06/2001    S/P RENAL CELL CANCER  . TOTAL HIP ARTHROPLASTY Right 05/03/2011   Procedure: TOTAL HIP ARTHROPLASTY ANTERIOR APPROACH;  Surgeon: Mcarthur Rossetti;  Location: [facility];  Service: Orthopedics;  Laterality: Right;  Removal of Cannulated Screws Right Hip, Right Direct Anterior Hip Replacement    Family History  Problem Relation Age of Onset  . Heart failure Mother   . Breast cancer Maternal Aunt 82  . Breast cancer Cousin   . Colon cancer Neg Hx     Social History   Social History  . Marital status: Widowed    Spouse name: N/A  . Number of children: N/A  . Years of education: N/A   Occupational History  . Retired Retired   Social History Main Topics  . Smoking status: Former Smoker     Packs/day: 0.50    Years: 40.00    Types: Cigarettes    Quit date: 05/06/2001  . Smokeless tobacco: Never Used  . Alcohol use No  . Drug use: No  . Sexual activity: No   Other Topics Concern  . 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   ALCOHOL USE-NO   RETIRED   FORMER TOBACCO USE.....1/2 PACK X 65 YRS UNTIL 2003   Would desire CPR   The PMH, PSH, Social History, Family History, Medications, and allergies have been reviewed in [facility],  and have been updated if relevant.   Review of Systems  Constitutional: Positive for chills. Negative for fever.  Genitourinary: Negative for dysuria, flank pain, frequency and hematuria.  All other systems reviewed and are negative.      Objective:    BP 140/70   Pulse 67   Temp 98 F (36.7 C)   Wt 188 lb 8 oz (85.5 kg)   SpO2 98%   BMI 30.42 kg/m    Physical Exam  Constitutional: She is oriented to person, place, and time. She appears well-developed and well-nourished. No distress.  HENT:  Head: Normocephalic and atraumatic.  Eyes: Conjunctivae are normal.  Cardiovascular: Normal rate.   Pulmonary/Chest: Effort normal.  Musculoskeletal: Normal range of motion.  Neurological: She is alert and oriented to person, place, and time. No cranial nerve deficit.  Skin: Skin is warm and dry. She is not diaphoretic.  Psychiatric: She has a normal mood and affect. Her behavior is normal. Judgment and thought content normal.  Nursing note and vitals reviewed.         Assessment & Plan:   Chills - Plan: POCT Urinalysis Dipstick (Automated) No Follow-up on file.

## 2016-09-24 ENCOUNTER — Ambulatory Visit: Payer: PPO

## 2016-09-26 ENCOUNTER — Ambulatory Visit: Payer: PPO

## 2016-09-27 ENCOUNTER — Ambulatory Visit: Payer: PPO

## 2016-10-01 ENCOUNTER — Ambulatory Visit: Payer: PPO

## 2016-10-04 ENCOUNTER — Ambulatory Visit (INDEPENDENT_AMBULATORY_CARE_PROVIDER_SITE_OTHER): Payer: PPO

## 2016-10-04 ENCOUNTER — Telehealth: Payer: Self-pay

## 2016-10-04 DIAGNOSIS — E538 Deficiency of other specified B group vitamins: Secondary | ICD-10-CM

## 2016-10-04 DIAGNOSIS — Z7902 Long term (current) use of antithrombotics/antiplatelets: Secondary | ICD-10-CM

## 2016-10-04 LAB — POCT INR: INR: 1.8

## 2016-10-04 MED ORDER — CYANOCOBALAMIN 1000 MCG/ML IJ SOLN: 1000.0000 ug | Freq: Once | INTRAMUSCULAR | Status: DC

## 2016-10-04 NOTE — Patient Instructions (Signed)
Pre visit review using our clinic review tool, if applicable. No additional management support is needed unless otherwise documented below in the visit note. 

## 2016-10-04 NOTE — Telephone Encounter (Signed)
Patient received her monthly B12 injection today (10/04/16) while in for INR check.    She would like to have next B12 at INR check in 4 weeks on 11/01/16.  This is 1 day before the 30 day mark as indicated in MD order.  Are you okay with me administering this B12 shot on 11/01/16?    Thanks.

## 2016-10-04 NOTE — Telephone Encounter (Signed)
Yes that is okay  

## 2016-10-08 ENCOUNTER — Ambulatory Visit
Admission: RE | Admit: 2016-10-08 | Discharge: 2016-10-08 | Disposition: A | Discharge status: Home / Self Care | Payer: PPO | Source: Ambulatory Visit | Attending: Family Medicine | Admitting: Family Medicine

## 2016-10-08 DIAGNOSIS — Z1231 Encounter for screening mammogram for malignant neoplasm of breast: Secondary | ICD-10-CM | POA: Diagnosis not present

## 2016-10-17 ENCOUNTER — Ambulatory Visit (INDEPENDENT_AMBULATORY_CARE_PROVIDER_SITE_OTHER): Payer: PPO

## 2016-10-17 DIAGNOSIS — Z95810 Presence of automatic (implantable) cardiac defibrillator: Secondary | ICD-10-CM | POA: Diagnosis not present

## 2016-10-17 DIAGNOSIS — I5032 Chronic diastolic (congestive) heart failure: Secondary | ICD-10-CM | POA: Diagnosis not present

## 2016-10-17 NOTE — Progress Notes (Signed)
EPIC Encounter for ICM Monitoring  Patient Name: [name] is a 83 y.o. female Date: 10/17/2016 Primary Care Physican: Lucille Passy, MD Primary Cardiologist: Rockey Situ Electrophysiologist: Caryl Comes Dry Weight:184.6 lbs       Heart Failure questions reviewed, pt asymptomatic.   Thoracic impedance normal.  Prescribed dosage: Furosemide 40 mg 1 tablet 3 times a day. Does not always take 3 tablets daily as prescribed.  Labs: 06/03/2016 Creatinine 1.61, BUN 15, Potassium 4.6, Sodium 141, EGFR 30-35 10/26/2017Creatinine 1.63, BUN 19, Potassium 3.5, Sodium 139, EGFR 29-33  03/19/2016 Creatinine 1.59, BUN 20, Potassium 4.1, Sodium 141  02/12/2016 Creatinine 1.70, BUN 20, Potassium 3.8, Sodium 139  12/26/2015 Creatinine 1.54, BUN 17, Potassium 4.0, Sodium 139, EGFR 31-36  07/27/2015 Creatinine 1.30, BUN 14, Potassium 4.9, Sodium 143, EGFR 39-45   Recommendations: No changes.  Advised to limit salt intake to 2000 mg/day and fluid intake to < 2 liters/day.  Encouraged to call for fluid symptoms.  Follow-up plan: ICM clinic phone appointment on 11/19/2016.   Copy of ICM check sent to device physician.   3 month ICM trend: 10/17/2016   1 Year ICM trend:      Rosalene Billings, RN 10/17/2016 7:54 AM

## 2016-10-30 ENCOUNTER — Telehealth: Payer: Self-pay

## 2016-10-30 NOTE — Telephone Encounter (Signed)
Patient calls to request rescheduling her INR appointment with the coumadin clinic on 11/01/16.

## 2016-10-30 NOTE — Telephone Encounter (Signed)
Called patient and LM on home VM to please call me back to discuss her appointment.  I am out of the office next week and really need to check her this week.

## 2016-10-31 NOTE — Telephone Encounter (Signed)
Spoke with patient, she is going to reschedule her appointment with me to 2:00pm tomorrow (6/28) in the coumadin clinic.  Patient requests that we collect a urine specimen while she is here to check for UTI.  She is having burning, and dysuria again.  Dr. Deborra Medina, are you okay with ordering a U/A with C/S if appropriate for patient to have done while here for INR check tomorrow?  Thanks.

## 2016-10-31 NOTE — Telephone Encounter (Signed)
Yes ok to check UA, urine C/S tomorrow.

## 2016-10-31 NOTE — Telephone Encounter (Signed)
Noted.  Patient has been added to the lab schedule.  Thanks.

## 2016-11-01 ENCOUNTER — Other Ambulatory Visit: Payer: PPO

## 2016-11-01 ENCOUNTER — Ambulatory Visit (INDEPENDENT_AMBULATORY_CARE_PROVIDER_SITE_OTHER): Payer: PPO

## 2016-11-01 ENCOUNTER — Other Ambulatory Visit: Payer: Self-pay | Admitting: Internal Medicine

## 2016-11-01 ENCOUNTER — Ambulatory Visit: Payer: PPO

## 2016-11-01 ENCOUNTER — Telehealth (INDEPENDENT_AMBULATORY_CARE_PROVIDER_SITE_OTHER): Payer: PPO

## 2016-11-01 ENCOUNTER — Other Ambulatory Visit: Payer: Self-pay | Admitting: Family Medicine

## 2016-11-01 DIAGNOSIS — R300 Dysuria: Secondary | ICD-10-CM | POA: Diagnosis not present

## 2016-11-01 DIAGNOSIS — E538 Deficiency of other specified B group vitamins: Secondary | ICD-10-CM | POA: Diagnosis not present

## 2016-11-01 DIAGNOSIS — Z7902 Long term (current) use of antithrombotics/antiplatelets: Secondary | ICD-10-CM | POA: Diagnosis not present

## 2016-11-01 LAB — POC URINALSYSI DIPSTICK (AUTOMATED)
Bilirubin, UA: NEGATIVE
Glucose, UA: NEGATIVE
Ketones, UA: NEGATIVE
Nitrite, UA: NEGATIVE
Protein, UA: NEGATIVE
Spec Grav, UA: 1.02 (ref 1.010–1.025)
Urobilinogen, UA: 0.2 E.U./dL
pH, UA: 6 (ref 5.0–8.0)

## 2016-11-01 LAB — POCT INR: INR: 1.9

## 2016-11-01 NOTE — Telephone Encounter (Addendum)
While patient in for INR check today she had a u/a with C/S collected per Dr. Hulen Shouts order due to ongoing dysuria.  Please review and let me know if abx therapy is indicated and what will be ordered.  C/s has been sent, I just wanted to know how to adjust her coumadin dosing pending abx.  Thanks.  INR TODAY:  1.9

## 2016-11-01 NOTE — Addendum Note (Signed)
Addended by: Ellamae Sia on: 11/01/2016 03:04 PM   Modules accepted: Orders

## 2016-11-01 NOTE — Telephone Encounter (Addendum)
Thanks.  I will forward to Dr. Deborra Medina for next week and to alert to contact patient regarding coumadin dose instructions if patient is to be started on an antibiotic.  I will be out of the office next week.   Patient notified awaiting culture.      Thanks.

## 2016-11-01 NOTE — Telephone Encounter (Signed)
Lets wait for urine culture results at this time

## 2016-11-04 ENCOUNTER — Other Ambulatory Visit: Payer: Self-pay | Admitting: Family Medicine

## 2016-11-04 LAB — URINE CULTURE

## 2016-11-04 MED ORDER — CIPROFLOXACIN HCL 250 MG PO TABS: 250.0000 mg | ORAL_TABLET | Freq: Two times a day (BID) | ORAL | 0 refills | Status: DC

## 2016-11-07 ENCOUNTER — Encounter: Payer: Self-pay | Admitting: Family Medicine

## 2016-11-07 ENCOUNTER — Ambulatory Visit (INDEPENDENT_AMBULATORY_CARE_PROVIDER_SITE_OTHER): Payer: PPO | Admitting: Family Medicine

## 2016-11-07 VITALS — BP 132/68 | HR 63 | Temp 98.2°F | Wt 184.5 lb

## 2016-11-07 DIAGNOSIS — I481 Persistent atrial fibrillation: Secondary | ICD-10-CM | POA: Diagnosis not present

## 2016-11-07 DIAGNOSIS — N3000 Acute cystitis without hematuria: Secondary | ICD-10-CM

## 2016-11-07 DIAGNOSIS — R42 Dizziness and giddiness: Secondary | ICD-10-CM | POA: Diagnosis not present

## 2016-11-07 DIAGNOSIS — E782 Mixed hyperlipidemia: Secondary | ICD-10-CM

## 2016-11-07 DIAGNOSIS — I4819 Other persistent atrial fibrillation: Secondary | ICD-10-CM

## 2016-11-07 DIAGNOSIS — I6523 Occlusion and stenosis of bilateral carotid arteries: Secondary | ICD-10-CM

## 2016-11-07 LAB — RENAL FUNCTION PANEL
Albumin: 4.4 g/dL (ref 3.5–5.2)
BUN: 17 mg/dL (ref 6–23)
CO2: 31 mEq/L (ref 19–32)
Calcium: 9.4 mg/dL (ref 8.4–10.5)
Chloride: 100 mEq/L (ref 96–112)
Creatinine, Ser: 1.5 mg/dL — ABNORMAL HIGH (ref 0.40–1.20)
GFR: 35.45 mL/min — ABNORMAL LOW (ref >60.00)
Glucose, Bld: 96 mg/dL (ref 70–99)
Phosphorus: 3.7 mg/dL (ref 2.3–4.6)
Potassium: 4 mEq/L (ref 3.5–5.1)
Sodium: 139 mEq/L (ref 135–145)

## 2016-11-07 LAB — CBC WITH DIFFERENTIAL/PLATELET
Basophils Absolute: 0 10*3/uL (ref 0.0–0.1)
Basophils Relative: 1 % (ref 0.0–3.0)
Eosinophils Absolute: 0.1 10*3/uL (ref 0.0–0.7)
Eosinophils Relative: 2.3 % (ref 0.0–5.0)
HCT: 34 % — ABNORMAL LOW (ref 36.0–46.0)
Hemoglobin: 10.7 g/dL — ABNORMAL LOW (ref 12.0–15.0)
Lymphocytes Relative: 24.2 % (ref 12.0–46.0)
Lymphs Abs: 1.1 10*3/uL (ref 0.7–4.0)
MCHC: 31.4 g/dL (ref 30.0–36.0)
MCV: 72.5 fl — ABNORMAL LOW (ref 78.0–100.0)
Monocytes Absolute: 0.6 10*3/uL (ref 0.1–1.0)
Monocytes Relative: 14.7 % — ABNORMAL HIGH (ref 3.0–12.0)
Neutro Abs: 2.5 10*3/uL (ref 1.4–7.7)
Neutrophils Relative %: 57.8 % (ref 43.0–77.0)
Platelets: 190 10*3/uL (ref 150.0–400.0)
RBC: 4.7 Mil/uL (ref 3.87–5.11)
RDW: 21.8 % — ABNORMAL HIGH (ref 11.5–15.5)
WBC: 4.4 10*3/uL (ref 4.0–10.5)

## 2016-11-07 LAB — TSH: TSH: 2.88 u[IU]/mL (ref 0.35–4.50)

## 2016-11-07 LAB — LDL CHOLESTEROL, DIRECT: Direct LDL: 146 mg/dL

## 2016-11-07 NOTE — Assessment & Plan Note (Signed)
rec finish cipro course.

## 2016-11-07 NOTE — Assessment & Plan Note (Signed)
Sounds regular today.  ?

## 2016-11-07 NOTE — Assessment & Plan Note (Addendum)
Of unclear etiology. Orthostatics normal today. nonfocal neurological exam today.  Encouraged good hydration status.  No med changes done today as bp in office adequate. I suggested she bring in bp cuff to next visit to compare with our cuff.  Check labs to further evaluate dizziness.

## 2016-11-07 NOTE — Progress Notes (Signed)
BP 132/68 (BP Location: Right Arm)   Pulse 63   Temp 98.2 F (36.8 C) (Oral)   Wt 184 lb 8 oz (83.7 kg)   SpO2 97%   BMI 29.78 kg/m    CC: BP check, dizziness Subjective:    Patient ID: [name], female    DOB: 07/24/1933, 83 y.o.   MRN: [id number]  HPI: [name] is a 83 y.o. female presenting on 11/07/2016 for Blood Pressure Check and Dizziness   Denies h/o HTN - she takes carvedilol 12.5mg  bid, lasix 40mg  BID (at times TID) mainly for atrial fibrillation and chronic diastolic CHF. Does not check blood pressures regularly at home but brings log over last 2 days with recently uncontrolled readings (7/4: 138-190/60-114, 7/5: 149/60-70s with wrist cuff). She did not bring in BP wrist cuff today. Denies low blood pressure readings. Denies HA, vision changes, CP/tightness, leg swelling. Chronic dyspnea endorsed. Some ongoing dizziness that started last week. Yesterday symptoms did improve, then sharply recurred in afternoon. She also had mild R arm numbness.   Dizziness clarified to presyncope with lightheadedness. No vertigo. Denies chest pain. No new palpitations, headaches or syncope. Denies facial weakness or hemiparesis or slurred speech.   Known h/o TIA.  Known atrial fibrillation, she is on coumadin since 2006 and sees Mandy at our coumadin clinic.  H/o DVTs.  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  H/o sole L kidney - s/p R nephrectomy for renal cell cancer 2003.  Lab Results  Component Value Date   INR 1.9 11/01/2016   INR 1.8 10/04/2016   INR 2.9 09/03/2016    Currently under treatment for UTI with cipro 7 d course - started Tuesday, UCx in chart reviewed (50-100k klebsiella).   Relevant past medical, surgical, family and social history reviewed and updated as indicated. Interim medical history since our last visit reviewed. Allergies and medications reviewed and updated. Outpatient Medications Prior to Visit  Medication Sig Dispense Refill  .  acetaminophen (TYLENOL) 500 MG tablet Take 1,000 mg by mouth every 6 (six) hours as needed for headache.    . carvedilol (COREG) 12.5 MG tablet TAKE 1 TABLET BY MOUTH TWICE A DAY WITH A MEAL 60 tablet 6  . cetirizine (ZYRTEC) 10 MG tablet Take 10 mg by mouth daily.     . ciprofloxacin (CIPRO) 250 MG tablet Take 1 tablet (250 mg total) by mouth 2 (two) times daily. 14 tablet 0  . disopyramide (NORPACE) 150 MG capsule TAKE 1 CAPSULE BY MOUTH TWICE DAILY 60 capsule 2  . fluticasone (FLONASE) 50 MCG/ACT nasal spray Place 2 sprays into both nostrils daily. 16 g 6  . furosemide (LASIX) 40 MG tablet Take 1 tablet (40 mg total) by mouth 3 (three) times daily. 90 tablet 10  . rosuvastatin (CRESTOR) 40 MG tablet Take 1 tablet (40 mg total) by mouth daily. 90 tablet 3  . sildenafil (REVATIO) 20 MG tablet Take 1 tablet (20 mg total) by mouth 3 (three) times daily. 90 tablet 6  . warfarin (COUMADIN) 2 MG tablet TAKE BY MOUTH AS DIRECTED BY ANTI-COAGULATION CLINIC 120 tablet 0  . cephALEXin (KEFLEX) 500 MG capsule Take 1 capsule (500 mg total) by mouth 2 (two) times daily. 14 capsule 0   Facility-Administered Medications Prior to Visit  Medication Dose Route Frequency Provider Last Rate Last Dose  . cyanocobalamin ((VITAMIN B-12)) injection 1,000 mcg  1,000 mcg Intramuscular Q30 days Lucille Passy, MD   1,000 mcg at 10/04/16  1100     Per HPI unless specifically indicated in ROS section below Review of Systems     Objective:    BP 132/68 (BP Location: Right Arm)   Pulse 63   Temp 98.2 F (36.8 C) (Oral)   Wt 184 lb 8 oz (83.7 kg)   SpO2 97%   BMI 29.78 kg/m   Wt Readings from Last 3 Encounters:  11/07/16 184 lb 8 oz (83.7 kg)  09/17/16 188 lb 8 oz (85.5 kg)  09/12/16 189 lb (85.7 kg)    Physical Exam  Constitutional: She is oriented to person, place, and time. She appears well-developed and well-nourished. No distress.  HENT:  Mouth/Throat: Oropharynx is clear and moist. No oropharyngeal  exudate.  Neck: Carotid bruit is present (L sided ?referred).  Cardiovascular: Normal rate, regular rhythm and intact distal pulses.   Murmur (3/6 SEM) heard. Pulmonary/Chest: Effort normal and breath sounds normal. No respiratory distress. She has no wheezes. She has no rales.  Musculoskeletal: She exhibits no edema.  Neurological: She is alert and oriented to person, place, and time. She has normal strength. No cranial nerve deficit or sensory deficit. Coordination and gait normal.  CN 2-12 intact EOMI FTN intact  Skin: Skin is warm and dry. No rash noted.  Psychiatric: She has a normal mood and affect.  Nursing note and vitals reviewed.  Results for orders placed or performed in visit on 11/01/16  POCT Urinalysis Dipstick (Automated)  Result Value Ref Range   Color, UA straw    Clarity, UA cloudy    Glucose, UA neg    Bilirubin, UA neg    Ketones, UA neg    Spec Grav, UA 1.020 1.010 - 1.025   Blood, UA 1+    pH, UA 6.0 5.0 - 8.0   Protein, UA neg    Urobilinogen, UA 0.2 0.2 or 1.0 E.U./dL   Nitrite, UA neg    Leukocytes, UA Small (1+) (A) Negative   Known carotid stenosis, latest [place] 06/2016 with stable blockages L>R rec rpt 1 yr (reviewed).     Assessment & Plan:   Problem List Items Addressed This Visit    Atrial fibrillation ([place])    Sounds regular today.       Carotid artery stenosis    UTD carotid US.       Dizziness - Primary    Of unclear etiology. Orthostatics normal today. nonfocal neurological exam today.  Encouraged good hydration status.  No med changes done today as bp in office adequate. I suggested she bring in bp cuff to next visit to compare with our cuff.  Check labs to further evaluate dizziness.       Relevant Orders   TSH   CBC with Differential/Platelet   Renal function panel   Hyperlipidemia   Relevant Orders   LDL Cholesterol, Direct   UTI (urinary tract infection)    rec finish cipro course.           Follow up plan: Return if  symptoms worsen or fail to improve.  Ria Bush, MD

## 2016-11-07 NOTE — Assessment & Plan Note (Signed)
UTD carotid US.

## 2016-11-07 NOTE — Patient Instructions (Signed)
Labs today to check for causes of dizziness. Make sure you stay well hydrated. Blood pressure is looking ok today. Continue current medicines.  Good to see you today, continue to monitor blood pressures and let us know if persistently high or new symptoms develop.

## 2016-11-11 ENCOUNTER — Other Ambulatory Visit: Payer: Self-pay | Admitting: *Deleted

## 2016-11-11 MED ORDER — ROSUVASTATIN CALCIUM 40 MG PO TABS: 40.0000 mg | ORAL_TABLET | Freq: Every day | ORAL | 3 refills | Status: DC

## 2016-11-13 ENCOUNTER — Encounter: Payer: Self-pay | Admitting: *Deleted

## 2016-11-15 ENCOUNTER — Telehealth: Payer: Self-pay

## 2016-11-15 DIAGNOSIS — R55 Syncope and collapse: Secondary | ICD-10-CM

## 2016-11-15 DIAGNOSIS — R42 Dizziness and giddiness: Secondary | ICD-10-CM

## 2016-11-15 NOTE — Telephone Encounter (Signed)
Pt called to let us know she's still dizzy about the same as last week.  OA#[phone].

## 2016-11-15 NOTE — Telephone Encounter (Signed)
How are home blood pressures? For ongoing dizziness, let's check head CT. Ordered.  I do recommend f/u with PCP for possible further evaluation of anemia if indicated.

## 2016-11-15 NOTE — Telephone Encounter (Signed)
Attempted to call pt, line busy.

## 2016-11-15 NOTE — Telephone Encounter (Signed)
"  Average 142/69"  Agreed with CT scan.  appt with Dr. Deborra Medina scheduled.

## 2016-11-18 ENCOUNTER — Ambulatory Visit (INDEPENDENT_AMBULATORY_CARE_PROVIDER_SITE_OTHER): Payer: PPO | Admitting: Family Medicine

## 2016-11-18 VITALS — BP 120/60 | HR 89 | Wt 188.0 lb

## 2016-11-18 DIAGNOSIS — D649 Anemia, unspecified: Secondary | ICD-10-CM | POA: Insufficient documentation

## 2016-11-18 DIAGNOSIS — D509 Iron deficiency anemia, unspecified: Secondary | ICD-10-CM

## 2016-11-18 DIAGNOSIS — R300 Dysuria: Secondary | ICD-10-CM | POA: Diagnosis not present

## 2016-11-18 DIAGNOSIS — D500 Iron deficiency anemia secondary to blood loss (chronic): Secondary | ICD-10-CM | POA: Insufficient documentation

## 2016-11-18 DIAGNOSIS — R8290 Unspecified abnormal findings in urine: Secondary | ICD-10-CM | POA: Diagnosis not present

## 2016-11-18 LAB — POC URINALSYSI DIPSTICK (AUTOMATED)
Bilirubin, UA: NEGATIVE
Glucose, UA: NEGATIVE
Ketones, UA: NEGATIVE
Nitrite, UA: NEGATIVE
Protein, UA: NEGATIVE
Spec Grav, UA: 1.02 (ref 1.010–1.025)
Urobilinogen, UA: 0.2 E.U./dL
pH, UA: 6 (ref 5.0–8.0)

## 2016-11-18 LAB — CBC WITH DIFFERENTIAL/PLATELET
Basophils Absolute: 0 10*3/uL (ref 0.0–0.1)
Basophils Relative: 0.9 % (ref 0.0–3.0)
Eosinophils Absolute: 0.1 10*3/uL (ref 0.0–0.7)
Eosinophils Relative: 2.1 % (ref 0.0–5.0)
HCT: 32.7 % — ABNORMAL LOW (ref 36.0–46.0)
Hemoglobin: 10.4 g/dL — ABNORMAL LOW (ref 12.0–15.0)
Lymphocytes Relative: 27.2 % (ref 12.0–46.0)
Lymphs Abs: 1.4 10*3/uL (ref 0.7–4.0)
MCHC: 32 g/dL (ref 30.0–36.0)
MCV: 73.1 fl — ABNORMAL LOW (ref 78.0–100.0)
Monocytes Absolute: 0.7 10*3/uL (ref 0.1–1.0)
Monocytes Relative: 13.5 % — ABNORMAL HIGH (ref 3.0–12.0)
Neutro Abs: 2.9 10*3/uL (ref 1.4–7.7)
Neutrophils Relative %: 56.3 % (ref 43.0–77.0)
Platelets: 165 10*3/uL (ref 150.0–400.0)
RBC: 4.47 Mil/uL (ref 3.87–5.11)
RDW: 22.2 % — ABNORMAL HIGH (ref 11.5–15.5)
WBC: 5.2 10*3/uL (ref 4.0–10.5)

## 2016-11-18 LAB — FERRITIN: Ferritin: 19.1 ng/mL (ref 10.0–291.0)

## 2016-11-18 LAB — VITAMIN B12: Vitamin B-12: 780 pg/mL (ref 211–911)

## 2016-11-18 NOTE — Addendum Note (Signed)
Addended by: Marchia Bond on: 11/18/2016 03:42 PM   Modules accepted: Orders

## 2016-11-18 NOTE — Assessment & Plan Note (Signed)
UA pos for 2+ LE and 1+ blood, just finished cipro. Will send urine for cx prior to starting another course of abx.

## 2016-11-18 NOTE — Assessment & Plan Note (Signed)
Will check additional labs today, IFOB. This is likely multifactorial. The patient indicates understanding of these issues and agrees with the plan. Orders Placed This Encounter  Procedures  . Fecal occult blood, imunochemical  . Urine Culture  . Ferritin  . CBC with Differential/Platelet  . Vitamin B12  . POCT Urinalysis Dipstick (Automated)

## 2016-11-18 NOTE — Progress Notes (Signed)
Subjective:   Patient ID: [name], female    DOB: August 15, 1933, 83 y.o.   MRN: [id number]  [name] is a pleasant 83 y.o. year old female who presents to clinic today with Anemia  on 11/18/2016  HPI:  Saw Dr. Darnell Level on 11/07/16 for dizziness. Note reviewed.  CBC showed persistent anemia, advised to follow this up with me today.  Has known h/o vi b12 deficiency and on chronic anticoagulation with coumadin.  Also having dysuria here today.  Urine cx from 11/01/16- >100,00 Klebsiella , resistent to amp to cefazolin.  Just finished cipro. Lab Results  Component Value Date   WBC 4.4 11/07/2016   HGB 10.7 (L) 11/07/2016   HCT 34.0 (L) 11/07/2016   MCV 72.5 (L) 11/07/2016   PLT 190.0 11/07/2016   Colonoscopy 10/2010  Current Outpatient Prescriptions on File Prior to Visit  Medication Sig Dispense Refill  . acetaminophen (TYLENOL) 500 MG tablet Take 1,000 mg by mouth every 6 (six) hours as needed for headache.    . carvedilol (COREG) 12.5 MG tablet TAKE 1 TABLET BY MOUTH TWICE A DAY WITH A MEAL 60 tablet 6  . cetirizine (ZYRTEC) 10 MG tablet Take 10 mg by mouth daily.     . ciprofloxacin (CIPRO) 250 MG tablet Take 1 tablet (250 mg total) by mouth 2 (two) times daily. 14 tablet 0  . disopyramide (NORPACE) 150 MG capsule TAKE 1 CAPSULE BY MOUTH TWICE DAILY 60 capsule 2  . fluticasone (FLONASE) 50 MCG/ACT nasal spray Place 2 sprays into both nostrils daily. 16 g 6  . furosemide (LASIX) 40 MG tablet Take 1 tablet (40 mg total) by mouth 3 (three) times daily. 90 tablet 10  . rosuvastatin (CRESTOR) 40 MG tablet Take 1 tablet (40 mg total) by mouth daily. 90 tablet 3  . sildenafil (REVATIO) 20 MG tablet Take 1 tablet (20 mg total) by mouth 3 (three) times daily. 90 tablet 6  . warfarin (COUMADIN) 2 MG tablet TAKE BY MOUTH AS DIRECTED BY ANTI-COAGULATION CLINIC 120 tablet 0  . [DISCONTINUED] hydrochlorothiazide (HYDRODIURIL) 25 MG tablet Take 25 mg by mouth daily after breakfast.       Current Facility-Administered Medications on File Prior to Visit  Medication Dose Route Frequency Provider Last Rate Last Dose  . cyanocobalamin ((VITAMIN B-12)) injection 1,000 mcg  1,000 mcg Intramuscular Q30 days Lucille Passy, MD   1,000 mcg at 10/04/16 1100    Allergies  Allergen Reactions  . Codeine Nausea And Vomiting  . Morphine And Related Nausea And Vomiting  . Sulfonamide Derivatives Other (See Comments)    REACTION: Dry mouth    Past Medical History:  Diagnosis Date  . Adjustment disorder with anxiety   . Anginal pain ([place])   . Arthritis    "some in my hands" (11/11/2012)  . Atrial fibrillation ([place])   . Automatic implantable cardioverter-defibrillator in situ   . Blood in stool   . Carotid artery stenosis 09/2007   60-79% bilateral (stable)  . Carotid artery stenosis 10/2008   40-59% R 60-79%   . Chest pain, unspecified   . Chronic airway obstruction, not elsewhere classified   . Chronic bronchitis ([place])    "get it some; not q year" (11/11/2012)  . Chronic diastolic CHF (congestive heart failure) ([place]) 06/04/2013  . Chronic kidney disease, unspecified   . Coronary artery disease    non-obstructive by 2006 cath  . Dysuria   . Edema   . Encounter for therapeutic  drug monitoring   . Exertional shortness of breath   . Frequent UTI    "get them a couple times/yr" (11/11/2012)  . Hemorrhage of rectum and anus   . High cholesterol   . History of blood transfusion 04/2011   "after hip OR" (11/11/2012)  . Hypertension 05/20/2011  . Hypertr obst cardiomyop   . Hypotension, unspecified    cardiac cath 2006..nonobstructive CAD 30-40s lesions.Marland KitchenETT 1/09 nondiagnostic due to poor HR response..Right Renal Cancer 2003  . Long term (current) use of anticoagulants   . Malignant neoplasm of kidney, except pelvis   . Osteoarthritis of right hip   . Other acute sinusitis   . Other and unspecified coagulation defects   . Other malaise and fatigue   . Other screening mammogram   .  PONV (postoperative nausea and vomiting)   . Pre-operative cardiovascular examination   . Renal cancer ([place]) 06/2001   Right  . Secondary cardiomyopathy, unspecified   . Sinus bradycardia   . Special screening for osteoporosis   . Urge incontinence     Past Surgical History:  Procedure Laterality Date  . ABDOMINAL HYSTERECTOMY  1975   for benign causes  . APPENDECTOMY    . BI-VENTRICULAR PACEMAKER UPGRADE  05/04/2010  . CARDIAC CATHETERIZATION  2006  . CATARACT EXTRACTION W/ INTRAOCULAR LENS  IMPLANT, BILATERAL  01/2006-02-2006  . CHOLECYSTECTOMY N/A 11/11/2012   Procedure: LAPAROSCOPIC CHOLECYSTECTOMY WITH INTRAOPERATIVE CHOLANGIOGRAM;  Surgeon: Imogene Burn. Georgette Dover, MD;  Location: [facility];  Service: General;  Laterality: N/A;  . EP IMPLANTABLE DEVICE N/A 02/21/2016   Procedure: ICD Generator Changeout;  Surgeon: Deboraha Sprang, MD;  Location: [facility];  Service: Cardiovascular;  Laterality: N/A;  . INSERT / REPLACE / REMOVE PACEMAKER  05-01-11   02-28-05-/05-04-10-ICD-MEDTRONIC MAXIMAL DR  . JOINT REPLACEMENT    . LAPAROSCOPIC CHOLECYSTECTOMY  11/11/2012  . LAPAROSCOPIC LYSIS OF ADHESIONS N/A 11/11/2012   Procedure: LAPAROSCOPIC LYSIS OF ADHESIONS;  Surgeon: Imogene Burn. Georgette Dover, MD;  Location: [facility];  Service: General;  Laterality: N/A;  . NEPHRECTOMY Right 06/2001    S/P RENAL CELL CANCER  . TOTAL HIP ARTHROPLASTY Right 05/03/2011   Procedure: TOTAL HIP ARTHROPLASTY ANTERIOR APPROACH;  Surgeon: Mcarthur Rossetti;  Location: [facility];  Service: Orthopedics;  Laterality: Right;  Removal of Cannulated Screws Right Hip, Right Direct Anterior Hip Replacement    Family History  Problem Relation Age of Onset  . Heart failure Mother   . Breast cancer Maternal Aunt 82  . Breast cancer Cousin   . Colon cancer Neg Hx     Social History   Social History  . Marital status: Widowed    Spouse name: N/A  . Number of children: N/A  . Years of education: N/A   Occupational History  .  Retired Retired   Social History Main Topics  . Smoking status: Former Smoker    Packs/day: 0.50    Years: 40.00    Types: Cigarettes    Quit date: 05/06/2001  . Smokeless tobacco: Never Used  . Alcohol use No  . Drug use: No  . Sexual activity: No   Other Topics Concern  . 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   ALCOHOL USE-NO   RETIRED   FORMER TOBACCO USE.....1/2 PACK X 40  YRS UNTIL 2003   Would desire CPR   The PMH, PSH, Social History, Family History, Medications, and allergies have been reviewed in [facility], and have been updated if relevant.  Review of Systems  Constitutional: Negative.   Respiratory: Negative.   Gastrointestinal: Negative.   Genitourinary: Positive for dysuria.  Musculoskeletal: Negative.   Neurological: Negative.   Hematological: Negative.   Psychiatric/Behavioral: Negative.   All other systems reviewed and are negative.      Objective:    BP 120/60   Pulse 89   Wt 188 lb (85.3 kg)   SpO2 100%   BMI 30.34 kg/m    Physical Exam   General:  Well-developed,well-nourished,in no acute distress; alert,appropriate and cooperative throughout examination Head:  normocephalic and atraumatic.   Eyes:  vision grossly intact, PERRL Ears:  R ear normal and L ear normal externally, TMs clear bilaterally Nose:  no external deformity.   Mouth:  good dentition.   Neck:  No deformities, masses, or tenderness noted..   Lungs:  Normal respiratory effort, chest expands symmetrically. Lungs are clear to auscultation, no crackles or wheezes. Heart:  Normal rate and regular rhythm. S1 and S2 normal without gallop, murmur, click, rub or other extra sounds. Abdomen:  Bowel sounds positive,abdomen soft and non-tender without masses, organomegaly or hernias noted.  Extremities:  No clubbing,  cyanosis, edema, or deformity noted with normal full range of motion of all joints.   Neurologic:  alert & oriented X3 and gait normal.   Skin:  Intact without suspicious lesions or rashes Psych:  Cognition and judgment appear intact. Alert and cooperative with normal attention span and concentration. No apparent delusions, illusions, hallucinations       Assessment & Plan:   Microcytic anemia  Dysuria No Follow-up on file.

## 2016-11-19 ENCOUNTER — Telehealth: Payer: Self-pay | Admitting: Cardiology

## 2016-11-19 ENCOUNTER — Ambulatory Visit (INDEPENDENT_AMBULATORY_CARE_PROVIDER_SITE_OTHER): Payer: PPO | Admitting: *Deleted

## 2016-11-19 DIAGNOSIS — I5032 Chronic diastolic (congestive) heart failure: Secondary | ICD-10-CM | POA: Diagnosis not present

## 2016-11-19 DIAGNOSIS — Z95810 Presence of automatic (implantable) cardiac defibrillator: Secondary | ICD-10-CM

## 2016-11-19 DIAGNOSIS — I428 Other cardiomyopathies: Secondary | ICD-10-CM

## 2016-11-19 NOTE — Patient Instructions (Signed)
Pre visit review using our clinic review tool, if applicable. No additional management support is needed unless otherwise documented below in the visit note. 

## 2016-11-19 NOTE — Telephone Encounter (Signed)
Erroneous Encounter

## 2016-11-19 NOTE — Telephone Encounter (Signed)
Attempted to confirm remote transmission with pt. No answer and was unable to leave a message.   

## 2016-11-20 ENCOUNTER — Other Ambulatory Visit: Payer: Self-pay | Admitting: Family Medicine

## 2016-11-20 ENCOUNTER — Encounter: Payer: Self-pay | Admitting: Cardiology

## 2016-11-20 LAB — URINE CULTURE

## 2016-11-20 MED ORDER — CEPHALEXIN 500 MG PO CAPS: 500.0000 mg | ORAL_CAPSULE | Freq: Two times a day (BID) | ORAL | 0 refills | Status: DC

## 2016-11-20 NOTE — Progress Notes (Signed)
Remote ICD transmission.   

## 2016-11-21 LAB — CUP PACEART REMOTE DEVICE CHECK
Battery Remaining Longevity: 86 mo
Battery Voltage: 3 V
Brady Statistic AP VP Percent: 78.88 %
Brady Statistic AP VS Percent: 0.18 %
Brady Statistic AS VP Percent: 17.8 %
Brady Statistic AS VS Percent: 3.14 %
Brady Statistic RA Percent Paced: 75.47 %
Brady Statistic RV Percent Paced: 96.41 %
Date Time Interrogation Session: 20180718071809
HighPow Impedance: 39 Ohm
HighPow Impedance: 45 Ohm
Implantable Lead Implant Date: 20061026
Implantable Lead Implant Date: 20111230
Implantable Lead Location: 753859
Implantable Lead Location: 753860
Implantable Lead Model: 5076
Implantable Lead Model: 6947
Implantable Pulse Generator Implant Date: 20171018
Lead Channel Impedance Value: 361 Ohm
Lead Channel Impedance Value: 418 Ohm
Lead Channel Impedance Value: 456 Ohm
Lead Channel Pacing Threshold Amplitude: 0.75 V
Lead Channel Pacing Threshold Pulse Width: 0.4 ms
Lead Channel Sensing Intrinsic Amplitude: 0.5 mV
Lead Channel Sensing Intrinsic Amplitude: 0.5 mV
Lead Channel Sensing Intrinsic Amplitude: 12.625 mV
Lead Channel Sensing Intrinsic Amplitude: 12.625 mV
Lead Channel Setting Pacing Amplitude: 2.5 V
Lead Channel Setting Pacing Amplitude: 2.5 V
Lead Channel Setting Pacing Pulse Width: 0.4 ms
Lead Channel Setting Sensing Sensitivity: 0.3 mV

## 2016-11-22 ENCOUNTER — Ambulatory Visit (INDEPENDENT_AMBULATORY_CARE_PROVIDER_SITE_OTHER): Payer: PPO

## 2016-11-22 DIAGNOSIS — Z7902 Long term (current) use of antithrombotics/antiplatelets: Secondary | ICD-10-CM | POA: Diagnosis not present

## 2016-11-22 LAB — POCT INR: INR: 3.3

## 2016-11-22 NOTE — Progress Notes (Signed)
EPIC Encounter for ICM Monitoring  Patient Name: [name] is a 83 y.o. female Date: 11/22/2016 Primary Care Physican: Lucille Passy, MD Primary Cardiologist: Rockey Situ Electrophysiologist: Caryl Comes Dry Weight:188 lbs             Heart Failure questions reviewed, pt symptomatic with weight gain this morning   Thoracic impedance normal.  Prescribed dosage: Furosemide 40 mg 1 tablet 3 times a day. Does not always take 3 tablets daily as prescribed.  Patient self adjusts Furosemide as needed   Labs: 11/07/2016 Creatinine 1.50, BUN 17, Potassium 4.0, Sodium 139, EGFR 35.45 06/03/2016 Creatinine 1.61, BUN 15, Potassium 4.6, Sodium 141, EGFR 30-35 10/26/2017Creatinine 1.63, BUN 19, Potassium 3.5, Sodium 139, EGFR 29-33  03/19/2016 Creatinine 1.59, BUN 20, Potassium 4.1, Sodium 141  02/12/2016 Creatinine 1.70, BUN 20, Potassium 3.8, Sodium 139  12/26/2015 Creatinine 1.54, BUN 17, Potassium 4.0, Sodium 139, EGFR 31-36  07/27/2015 Creatinine 1.30, BUN 14, Potassium 4.9, Sodium 143, EGFR 39-45  Recommendations: She is going to take extra Furosemide today due to weight gain.   Encouraged to call for fluid symptoms.  Follow-up plan: ICM clinic phone appointment on 12/20/2016.    Copy of ICM check sent to device physician.   3 month ICM trend: 11/20/2016   1 Year ICM trend:      Rosalene Billings, RN 11/22/2016 10:50 AM

## 2016-11-22 NOTE — Patient Instructions (Signed)
Pre visit review using our clinic review tool, if applicable. No additional management support is needed unless otherwise documented below in the visit note. 

## 2016-11-27 ENCOUNTER — Ambulatory Visit
Admission: RE | Admit: 2016-11-27 | Discharge: 2016-11-27 | Disposition: A | Discharge status: Home / Self Care | Payer: PPO | Source: Ambulatory Visit | Attending: Family Medicine | Admitting: Family Medicine

## 2016-11-27 DIAGNOSIS — R55 Syncope and collapse: Secondary | ICD-10-CM | POA: Diagnosis not present

## 2016-11-27 DIAGNOSIS — R42 Dizziness and giddiness: Secondary | ICD-10-CM | POA: Diagnosis not present

## 2016-11-28 ENCOUNTER — Ambulatory Visit (INDEPENDENT_AMBULATORY_CARE_PROVIDER_SITE_OTHER): Payer: PPO

## 2016-11-28 DIAGNOSIS — Z7902 Long term (current) use of antithrombotics/antiplatelets: Secondary | ICD-10-CM | POA: Diagnosis not present

## 2016-11-28 DIAGNOSIS — E538 Deficiency of other specified B group vitamins: Secondary | ICD-10-CM

## 2016-11-28 LAB — POCT INR: INR: 2.4

## 2016-11-28 MED ORDER — CYANOCOBALAMIN 1000 MCG/ML IJ SOLN
1000.0000 ug | Freq: Once | INTRAMUSCULAR | Status: AC
  Administered 2016-11-28: 1000 ug via INTRAMUSCULAR

## 2016-11-28 NOTE — Patient Instructions (Signed)
Pre visit review using our clinic review tool, if applicable. No additional management support is needed unless otherwise documented below in the visit note. 

## 2016-12-03 ENCOUNTER — Telehealth: Payer: Self-pay

## 2016-12-03 NOTE — Telephone Encounter (Signed)
Called patient. Gave results. Patient verbalized understanding. Reports dizziness has improved.

## 2016-12-03 NOTE — Telephone Encounter (Signed)
-----   Message from Ria Bush, MD sent at 11/27/2016  7:07 PM EDT ----- plz notify CT scan returned overall ok - some age related changes without acute process. Hopefully dizziness has improved.

## 2016-12-13 ENCOUNTER — Ambulatory Visit: Payer: PPO

## 2016-12-17 ENCOUNTER — Ambulatory Visit: Payer: PPO

## 2016-12-19 ENCOUNTER — Other Ambulatory Visit: Payer: Self-pay | Admitting: Family Medicine

## 2016-12-19 NOTE — Telephone Encounter (Signed)
Patient continues to be compliant with coumadin management.  Will refill X 3 months.

## 2016-12-20 ENCOUNTER — Ambulatory Visit (INDEPENDENT_AMBULATORY_CARE_PROVIDER_SITE_OTHER): Payer: PPO

## 2016-12-20 ENCOUNTER — Telehealth: Payer: Self-pay

## 2016-12-20 DIAGNOSIS — Z95810 Presence of automatic (implantable) cardiac defibrillator: Secondary | ICD-10-CM

## 2016-12-20 DIAGNOSIS — Z7902 Long term (current) use of antithrombotics/antiplatelets: Secondary | ICD-10-CM | POA: Diagnosis not present

## 2016-12-20 DIAGNOSIS — I5032 Chronic diastolic (congestive) heart failure: Secondary | ICD-10-CM | POA: Diagnosis not present

## 2016-12-20 LAB — POCT INR: INR: 3.2

## 2016-12-20 NOTE — Telephone Encounter (Signed)
Remote ICM transmission received.  Attempted patient call and no answer or answering machine.  

## 2016-12-20 NOTE — Progress Notes (Signed)
EPIC Encounter for ICM Monitoring  Patient Name: [name] is a 83 y.o. female Date: 12/20/2016 Primary Care Physican: Lucille Passy, MD Primary Cardiologist: Rockey Situ Electrophysiologist: Caryl Comes Dry Weight:Previous ICM [id number] lbs  Time in AT/AF 1.3 hr/day (5.4%)  Longest AT/AF 3 hours       Attempted call to patient and unable to reach.  Transmission reviewed.    Thoracic impedance normal.  Prescribed dosage: Furosemide 40 mg 1 tablet 3 times a day. Does not always take 3 tablets daily as prescribed.  Patient self adjusts Furosemide as needed   Labs: 11/07/2016 Creatinine 1.50, BUN 17, Potassium 4.0, Sodium 139, EGFR 35.45 06/03/2016 Creatinine 1.61, BUN 15, Potassium 4.6, Sodium 141, EGFR 30-35 10/26/2017Creatinine 1.63, BUN 19, Potassium 3.5, Sodium 139, EGFR 29-33  03/19/2016 Creatinine 1.59, BUN 20, Potassium 4.1, Sodium 141  02/12/2016 Creatinine 1.70, BUN 20, Potassium 3.8, Sodium 139  12/26/2015 Creatinine 1.54, BUN 17, Potassium 4.0, Sodium 139, EGFR 31-36  07/27/2015 Creatinine 1.30, BUN 14, Potassium 4.9, Sodium 143, EGFR 39-45  Recommendations: NONE - Unable to reach patient   Follow-up plan: ICM clinic phone appointment on 01/20/2017.  Office appointment scheduled 02/18/2017 with Dr. Rockey Situ and 03/04/2017 with Dr Caryl Comes.  Copy of ICM check sent to device physician.   3 month ICM trend: 12/20/2016  AT/AF    1 Year ICM trend:      Rosalene Billings, RN 12/20/2016 8:50 AM

## 2016-12-20 NOTE — Patient Instructions (Signed)
Pre visit review using our clinic review tool, if applicable. No additional management support is needed unless otherwise documented below in the visit note. 

## 2016-12-24 ENCOUNTER — Encounter: Payer: Self-pay | Admitting: Family Medicine

## 2016-12-24 ENCOUNTER — Ambulatory Visit (INDEPENDENT_AMBULATORY_CARE_PROVIDER_SITE_OTHER): Payer: PPO | Admitting: Family Medicine

## 2016-12-24 VITALS — BP 142/70 | HR 82 | Temp 97.5°F | Wt 186.0 lb

## 2016-12-24 DIAGNOSIS — R300 Dysuria: Secondary | ICD-10-CM | POA: Diagnosis not present

## 2016-12-24 LAB — POC URINALSYSI DIPSTICK (AUTOMATED)
Bilirubin, UA: NEGATIVE
Glucose, UA: NEGATIVE
Ketones, UA: NEGATIVE
Leukocytes, UA: NEGATIVE
Nitrite, UA: NEGATIVE
Protein, UA: NEGATIVE
Spec Grav, UA: 1.02 (ref 1.010–1.025)
Urobilinogen, UA: 0.2 E.U./dL
pH, UA: 6 (ref 5.0–8.0)

## 2016-12-24 NOTE — Addendum Note (Signed)
Addended by: Wyvonne Lenz on: 12/24/2016 12:24 PM   Modules accepted: Orders

## 2016-12-24 NOTE — Progress Notes (Signed)
Subjective:   Patient ID: [name], female    DOB: Dec 18, 1933, 83 y.o.   MRN: [id number]  [name] is a pleasant 83 y.o. year old female who presents to clinic today with Urinary Tract Infection  on 12/24/2016  HPI:  ? If she has another UTI.  Having more frequency and dysuria past few days.  H/o recurrent UTIs.  Chart reviewed.  Urine cx 11/18/16- grew e coli resistant to cipro.  cipro was initially started prior to cx results but was d/c'd and pt was given course of keflex.  Urine cx from 11/01/16- >100,00 Klebsiella , resistent to to cefazolin.  Just finished cipro. Lab Results  Component Value Date   WBC 5.2 11/18/2016   HGB 10.4 (L) 11/18/2016   HCT 32.7 (L) 11/18/2016   MCV 73.1 (L) 11/18/2016   PLT 165.0 11/18/2016     Current Outpatient Prescriptions on File Prior to Visit  Medication Sig Dispense Refill  . acetaminophen (TYLENOL) 500 MG tablet Take 1,000 mg by mouth every 6 (six) hours as needed for headache.    . carvedilol (COREG) 12.5 MG tablet TAKE 1 TABLET BY MOUTH TWICE A DAY WITH A MEAL 60 tablet 6  . cephALEXin (KEFLEX) 500 MG capsule Take 1 capsule (500 mg total) by mouth 2 (two) times daily. 14 capsule 0  . cetirizine (ZYRTEC) 10 MG tablet Take 10 mg by mouth daily.     . ciprofloxacin (CIPRO) 250 MG tablet Take 1 tablet (250 mg total) by mouth 2 (two) times daily. 14 tablet 0  . disopyramide (NORPACE) 150 MG capsule TAKE 1 CAPSULE BY MOUTH TWICE DAILY 60 capsule 2  . fluticasone (FLONASE) 50 MCG/ACT nasal spray Place 2 sprays into both nostrils daily. 16 g 6  . furosemide (LASIX) 40 MG tablet Take 1 tablet (40 mg total) by mouth 3 (three) times daily. 90 tablet 10  . rosuvastatin (CRESTOR) 40 MG tablet Take 1 tablet (40 mg total) by mouth daily. 90 tablet 3  . sildenafil (REVATIO) 20 MG tablet Take 1 tablet (20 mg total) by mouth 3 (three) times daily. 90 tablet 6  . warfarin (COUMADIN) 2 MG tablet Take daily as directed by coumadin clinic  130 tablet 0  . [DISCONTINUED] hydrochlorothiazide (HYDRODIURIL) 25 MG tablet Take 25 mg by mouth daily after breakfast.      Current Facility-Administered Medications on File Prior to Visit  Medication Dose Route Frequency Provider Last Rate Last Dose  . cyanocobalamin ((VITAMIN B-12)) injection 1,000 mcg  1,000 mcg Intramuscular Q30 days Lucille Passy, MD   1,000 mcg at 11/01/16 1400    Allergies  Allergen Reactions  . Codeine Nausea And Vomiting  . Morphine And Related Nausea And Vomiting  . Sulfonamide Derivatives Other (See Comments)    REACTION: Dry mouth    Past Medical History:  Diagnosis Date  . Adjustment disorder with anxiety   . Anginal pain ([place])   . Arthritis    "some in my hands" (11/11/2012)  . Atrial fibrillation ([place])   . Automatic implantable cardioverter-defibrillator in situ   . Blood in stool   . Carotid artery stenosis 09/2007   60-79% bilateral (stable)  . Carotid artery stenosis 10/2008   40-59% R 60-79%   . Chest pain, unspecified   . Chronic airway obstruction, not elsewhere classified   . Chronic bronchitis ([place])    "get it some; not q year" (11/11/2012)  . Chronic diastolic CHF (congestive heart failure) ([place]) 06/04/2013  .  Chronic kidney disease, unspecified   . Coronary artery disease    non-obstructive by 2006 cath  . Dysuria   . Edema   . Encounter for therapeutic drug monitoring   . Exertional shortness of breath   . Frequent UTI    "get them a couple times/yr" (11/11/2012)  . Hemorrhage of rectum and anus   . High cholesterol   . History of blood transfusion 04/2011   "after hip OR" (11/11/2012)  . Hypertension 05/20/2011  . Hypertr obst cardiomyop   . Hypotension, unspecified    cardiac cath 2006..nonobstructive CAD 30-40s lesions.Marland KitchenETT 1/09 nondiagnostic due to poor HR response..Right Renal Cancer 2003  . Long term (current) use of anticoagulants   . Malignant neoplasm of kidney, except pelvis   . Osteoarthritis of right hip   . Other  acute sinusitis   . Other and unspecified coagulation defects   . Other malaise and fatigue   . Other screening mammogram   . PONV (postoperative nausea and vomiting)   . Pre-operative cardiovascular examination   . Renal cancer ([place]) 06/2001   Right  . Secondary cardiomyopathy, unspecified   . Sinus bradycardia   . Special screening for osteoporosis   . Urge incontinence     Past Surgical History:  Procedure Laterality Date  . ABDOMINAL HYSTERECTOMY  1975   for benign causes  . APPENDECTOMY    . BI-VENTRICULAR PACEMAKER UPGRADE  05/04/2010  . CARDIAC CATHETERIZATION  2006  . CATARACT EXTRACTION W/ INTRAOCULAR LENS  IMPLANT, BILATERAL  01/2006-02-2006  . CHOLECYSTECTOMY N/A 11/11/2012   Procedure: LAPAROSCOPIC CHOLECYSTECTOMY WITH INTRAOPERATIVE CHOLANGIOGRAM;  Surgeon: Imogene Burn. Georgette Dover, MD;  Location: [place];  Service: General;  Laterality: N/A;  . EP IMPLANTABLE DEVICE N/A 02/21/2016   Procedure: ICD Generator Changeout;  Surgeon: Deboraha Sprang, MD;  Location: [place] [facility];  Service: Cardiovascular;  Laterality: N/A;  . INSERT / REPLACE / REMOVE PACEMAKER  05-01-11   02-28-05-/05-04-10-ICD-MEDTRONIC MAXIMAL DR  . JOINT REPLACEMENT    . LAPAROSCOPIC CHOLECYSTECTOMY  11/11/2012  . LAPAROSCOPIC LYSIS OF ADHESIONS N/A 11/11/2012   Procedure: LAPAROSCOPIC LYSIS OF ADHESIONS;  Surgeon: Imogene Burn. Georgette Dover, MD;  Location: [place] [facility];  Service: General;  Laterality: N/A;  . NEPHRECTOMY Right 06/2001    S/P RENAL CELL CANCER  . TOTAL HIP ARTHROPLASTY Right 05/03/2011   Procedure: TOTAL HIP ARTHROPLASTY ANTERIOR APPROACH;  Surgeon: Mcarthur Rossetti;  Location: [facility];  Service: Orthopedics;  Laterality: Right;  Removal of Cannulated Screws Right Hip, Right Direct Anterior Hip Replacement    Family History  Problem Relation Age of Onset  . Heart failure Mother   . Breast cancer Maternal Aunt 82  . Breast cancer Cousin   . Colon cancer Neg Hx     Social History   Social History  .  Marital status: Widowed    Spouse name: N/A  . Number of children: N/A  . Years of education: N/A   Occupational History  . Retired Retired   Social History Main Topics  . Smoking status: Former Smoker    Packs/day: 0.50    Years: 40.00    Types: Cigarettes    Quit date: 05/06/2001  . Smokeless tobacco: Never Used  . Alcohol use No  . Drug use: No  . Sexual activity: No   Other Topics Concern  . 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   VERY  ACTIVE IN HER CHURCH, HAS GROUP OF 4 BEST FRIENDS, SELF TITLED "THE GOLDEN GIRLS".   ALCOHOL USE-NO   RETIRED   FORMER TOBACCO USE.....1/2 PACK X 17 YRS UNTIL 2003   Would desire CPR   The PMH, PSH, Social History, Family History, Medications, and allergies have been reviewed in [facility], and have been updated if relevant.  Review of Systems  Constitutional: Negative.   Respiratory: Negative.   Gastrointestinal: Negative.   Genitourinary: Positive for dysuria.  Musculoskeletal: Negative.   Neurological: Negative.   Hematological: Negative.   Psychiatric/Behavioral: Negative.   All other systems reviewed and are negative.      Objective:    BP (!) 142/70 (BP Location: Left Arm, Patient Position: Sitting, Cuff Size: Normal)   Pulse 82   Temp (!) 97.5 F (36.4 C) (Oral)   Wt 186 lb (84.4 kg)   SpO2 98%   BMI 30.02 kg/m    Physical Exam  Constitutional: She is oriented to person, place, and time. She appears well-developed and well-nourished. No distress.  HENT:  Head: Normocephalic and atraumatic.  Eyes: Conjunctivae are normal.  Cardiovascular: Normal rate.   Pulmonary/Chest: Effort normal.  Abdominal: Soft. Bowel sounds are normal. She exhibits no distension. There is no tenderness.  Musculoskeletal: Normal range of motion.  Neurological: She is alert and oriented to person, place, and time. No cranial nerve deficit.  Skin: Skin is warm and dry.  She is not diaphoretic.  Psychiatric: She has a normal mood and affect. Her behavior is normal. Thought content normal.  Nursing note and vitals reviewed.          Assessment & Plan:   Dysuria No Follow-up on file.

## 2016-12-24 NOTE — Assessment & Plan Note (Signed)
UA pos for trace blood only (she is on coumadin). Frequency may be due to bladder irritants... Drink water, avoid alcohol, caffeine, soda, citris, tomato, spicy foods. Send for cx given h/o recurrent UTIs.

## 2016-12-27 ENCOUNTER — Other Ambulatory Visit: Payer: Self-pay | Admitting: Family Medicine

## 2016-12-27 LAB — URINE CULTURE

## 2016-12-27 MED ORDER — AMOXICILLIN-POT CLAVULANATE 875-125 MG PO TABS: 1.0000 | ORAL_TABLET | Freq: Two times a day (BID) | ORAL | 0 refills | Status: AC

## 2017-01-03 ENCOUNTER — Other Ambulatory Visit: Payer: Self-pay | Admitting: Internal Medicine

## 2017-01-07 ENCOUNTER — Ambulatory Visit: Payer: PPO

## 2017-01-10 ENCOUNTER — Ambulatory Visit (INDEPENDENT_AMBULATORY_CARE_PROVIDER_SITE_OTHER): Payer: PPO

## 2017-01-10 ENCOUNTER — Ambulatory Visit: Payer: PPO

## 2017-01-10 ENCOUNTER — Other Ambulatory Visit (INDEPENDENT_AMBULATORY_CARE_PROVIDER_SITE_OTHER): Payer: PPO

## 2017-01-10 DIAGNOSIS — Z7902 Long term (current) use of antithrombotics/antiplatelets: Secondary | ICD-10-CM | POA: Diagnosis not present

## 2017-01-10 DIAGNOSIS — E538 Deficiency of other specified B group vitamins: Secondary | ICD-10-CM

## 2017-01-10 DIAGNOSIS — D509 Iron deficiency anemia, unspecified: Secondary | ICD-10-CM

## 2017-01-10 LAB — FECAL OCCULT BLOOD, GUAIAC: Fecal Occult Blood: NEGATIVE

## 2017-01-10 LAB — FECAL OCCULT BLOOD, IMMUNOCHEMICAL: Fecal Occult Bld: NEGATIVE

## 2017-01-10 LAB — POCT INR: INR: 2.7

## 2017-01-10 MED ORDER — CYANOCOBALAMIN 1000 MCG/ML IJ SOLN
1000.0000 ug | Freq: Once | INTRAMUSCULAR | Status: AC
  Administered 2017-01-10: 1000 ug via INTRAMUSCULAR

## 2017-01-10 NOTE — Patient Instructions (Signed)
Pre visit review using our clinic review tool, if applicable. No additional management support is needed unless otherwise documented below in the visit note. 

## 2017-01-13 ENCOUNTER — Encounter: Payer: Self-pay | Admitting: Family Medicine

## 2017-01-20 ENCOUNTER — Ambulatory Visit (INDEPENDENT_AMBULATORY_CARE_PROVIDER_SITE_OTHER): Payer: PPO

## 2017-01-20 DIAGNOSIS — I5032 Chronic diastolic (congestive) heart failure: Secondary | ICD-10-CM | POA: Diagnosis not present

## 2017-01-20 DIAGNOSIS — Z95810 Presence of automatic (implantable) cardiac defibrillator: Secondary | ICD-10-CM

## 2017-01-20 NOTE — Progress Notes (Signed)
EPIC Encounter for ICM Monitoring  Patient Name: [name] is a 83 y.o. female Date: 01/20/2017 Primary Care Physican: Lucille Passy, MD Primary Cardiologist: Rockey Situ Electrophysiologist: Faustino Congress Weight:188lbs   Clinical Status (20-Dec-2016 to 20-Jan-2017) Treated VT/VF 0 episodes  V. Pacing 99.4%  Atrial Pacing 88.3% Lower Rate 60 bpm Upper Rate 140 bpm  Time in AT/AF 2.5 hr/day (10.5%) -- Increased from 5.4% AT/AF 52 episodes   Observations (1) (20-Dec-2016 to 20-Jan-2017)  AT/AF >= 6 hr for 3 days.       Heart Failure questions reviewed, pt asymptomatic.   Thoracic impedance normal.  Prescribed dosage: Furosemide 40 mg 1 tablet 3 times a day. Does not always take 3 tablets daily as prescribed.  Patient self adjusts Furosemide as needed   Labs: 11/07/2016 Creatinine 1.50, BUN 17, Potassium 4.0, Sodium 139, EGFR 35.45 06/03/2016 Creatinine 1.61, BUN 15, Potassium 4.6, Sodium 141, EGFR 30-35 10/26/2017Creatinine 1.63, BUN 19, Potassium 3.5, Sodium 139, EGFR 29-33  03/19/2016 Creatinine 1.59, BUN 20, Potassium 4.1, Sodium 141  02/12/2016 Creatinine 1.70, BUN 20, Potassium 3.8, Sodium 139  12/26/2015 Creatinine 1.54, BUN 17, Potassium 4.0, Sodium 139, EGFR 31-36  07/27/2015 Creatinine 1.30, BUN 14, Potassium 4.9, Sodium 143, EGFR 39-45  Recommendations: No changes.   Encouraged to call for fluid symptoms.  Follow-up plan: ICM clinic phone appointment on 02/20/2017.  Office appointment scheduled 02/18/2017 with Dr. Rockey Situ and 03/04/2017 with Dr Caryl Comes.  Copy of ICM check sent to Dr. Caryl Comes and reviewed in office with Dr Caryl Comes for increased AT/AF burden.  No changes today will see in office 03/04/2017.  3 month ICM trend: 01/20/2017   1 Year ICM trend:      Rosalene Billings, RN 01/20/2017 9:49 AM

## 2017-02-07 ENCOUNTER — Ambulatory Visit (INDEPENDENT_AMBULATORY_CARE_PROVIDER_SITE_OTHER): Payer: PPO

## 2017-02-07 ENCOUNTER — Ambulatory Visit (INDEPENDENT_AMBULATORY_CARE_PROVIDER_SITE_OTHER): Payer: PPO | Admitting: Family Medicine

## 2017-02-07 ENCOUNTER — Encounter: Payer: Self-pay | Admitting: Family Medicine

## 2017-02-07 VITALS — BP 144/64 | HR 78 | Temp 97.6°F | Ht 66.0 in

## 2017-02-07 DIAGNOSIS — Z23 Encounter for immunization: Secondary | ICD-10-CM

## 2017-02-07 DIAGNOSIS — N3090 Cystitis, unspecified without hematuria: Secondary | ICD-10-CM | POA: Diagnosis not present

## 2017-02-07 DIAGNOSIS — I5032 Chronic diastolic (congestive) heart failure: Secondary | ICD-10-CM

## 2017-02-07 DIAGNOSIS — E538 Deficiency of other specified B group vitamins: Secondary | ICD-10-CM | POA: Diagnosis not present

## 2017-02-07 DIAGNOSIS — Z7902 Long term (current) use of antithrombotics/antiplatelets: Secondary | ICD-10-CM

## 2017-02-07 DIAGNOSIS — R300 Dysuria: Secondary | ICD-10-CM | POA: Diagnosis not present

## 2017-02-07 LAB — POCT URINALYSIS DIPSTICK
Bilirubin, UA: NEGATIVE
Blood, UA: NEGATIVE
Glucose, UA: NEGATIVE
Ketones, UA: NEGATIVE
Nitrite, UA: NEGATIVE
Protein, UA: NEGATIVE
Spec Grav, UA: 1.015 (ref 1.010–1.025)
Urobilinogen, UA: 0.2 E.U./dL
pH, UA: 6.5 (ref 5.0–8.0)

## 2017-02-07 LAB — POCT INR: INR: 3

## 2017-02-07 MED ORDER — CYANOCOBALAMIN 1000 MCG/ML IJ SOLN
1000.0000 ug | Freq: Once | INTRAMUSCULAR | Status: AC
  Administered 2017-02-07: 1000 ug via INTRAMUSCULAR

## 2017-02-07 MED ORDER — CEPHALEXIN 500 MG PO CAPS: 500.0000 mg | ORAL_CAPSULE | Freq: Two times a day (BID) | ORAL | 0 refills | Status: DC

## 2017-02-07 NOTE — Patient Instructions (Signed)
It was a pleasure to see you today and I look forward to seeing you next month  Urinary Tract Infection, Adult A urinary tract infection (UTI) is an infection of any part of the urinary tract, which includes the kidneys, ureters, bladder, and urethra. These organs make, store, and get rid of urine in the body. UTI can be a bladder infection (cystitis) or kidney infection (pyelonephritis). What are the causes? This infection may be caused by fungi, viruses, or bacteria. Bacteria are the most common cause of UTIs. This condition can also be caused by repeated incomplete emptying of the bladder during urination. What increases the risk? This condition is more likely to develop if:  You ignore your need to urinate or hold urine for long periods of time.  You do not empty your bladder completely during urination.  You wipe back to front after urinating or having a bowel movement, if you are female.  You are uncircumcised, if you are female.  You are constipated.  You have a urinary catheter that stays in place (indwelling).  You have a weak defense (immune) system.  You have a medical condition that affects your bowels, kidneys, or bladder.  You have diabetes.  You take antibiotic medicines frequently or for long periods of time, and the antibiotics no longer work well against certain types of infections (antibiotic resistance).  You take medicines that irritate your urinary tract.  You are exposed to chemicals that irritate your urinary tract.  You are female.  What are the signs or symptoms? Symptoms of this condition include:  Fever.  Frequent urination or passing small amounts of urine frequently.  Needing to urinate urgently.  Pain or burning with urination.  Urine that smells bad or unusual.  Cloudy urine.  Pain in the lower abdomen or back.  Trouble urinating.  Blood in the urine.  Vomiting or being less hungry than normal.  Diarrhea or abdominal  pain.  Vaginal discharge, if you are female.  How is this diagnosed? This condition is diagnosed with a medical history and physical exam. You will also need to provide a urine sample to test your urine. Other tests may be done, including:  Blood tests.  Sexually transmitted disease (STD) testing.  If you have had more than one UTI, a cystoscopy or imaging studies may be done to determine the cause of the infections. How is this treated? Treatment for this condition often includes a combination of two or more of the following:  Antibiotic medicine.  Other medicines to treat less common causes of UTI.  Over-the-counter medicines to treat pain.  Drinking enough water to stay hydrated.  Follow these instructions at home:  Take over-the-counter and prescription medicines only as told by your health care provider.  If you were prescribed an antibiotic, take it as told by your health care provider. Do not stop taking the antibiotic even if you start to feel better.  Avoid alcohol, caffeine, tea, and carbonated beverages. They can irritate your bladder.  Drink enough fluid to keep your urine clear or pale yellow.  Keep all follow-up visits as told by your health care provider. This is important.  Make sure to: ? Empty your bladder often and completely. Do not hold urine for long periods of time. ? Empty your bladder before and after sex. ? Wipe from front to back after a bowel movement if you are female. Use each tissue one time when you wipe. Contact a health care provider if:  You have  back pain.  You have a fever.  You feel nauseous or vomit.  Your symptoms do not get better after 3 days.  Your symptoms go away and then return. Get help right away if:  You have severe back pain or lower abdominal pain.  You are vomiting and cannot keep down any medicines or water. This information is not intended to replace advice given to you by your health care provider. Make sure  you discuss any questions you have with your health care provider. Document Released: 01/30/2005 Document Revised: 10/04/2015 Document Reviewed: 03/13/2015 Elsevier Interactive Patient Education  2017 [employer].

## 2017-02-07 NOTE — Progress Notes (Signed)
Pre visit review using our clinic review tool, if applicable. No additional management support is needed unless otherwise documented below in the visit note. 

## 2017-02-07 NOTE — Progress Notes (Signed)
Subjective:    Patient ID: [name], female    DOB: 07/09/1933, 83 y.o.   MRN: [id number]  HPI This is an 83 yo female who presents today with fatigue x about a week. Some burning at beginning of urine stream and some during. Some nocturia recently (2-3). Some chills intermittently. No new back pain/flank pain. Mild nauseous. No diarrhea or constipation. Has history of frequent UTIs.  Was taking sildenafil and stopped (no side effects, just stopped it), thinks breathing has gotten more labored, plans to resume to see if breathing will improve. Has been generally doing well, just more fatigue lately.   Past Medical History:  Diagnosis Date  . Adjustment disorder with anxiety   . Anginal pain ([place])   . Arthritis    "some in my hands" (11/11/2012)  . Atrial fibrillation ([place])   . Automatic implantable cardioverter-defibrillator in situ   . Blood in stool   . Carotid artery stenosis 09/2007   60-79% bilateral (stable)  . Carotid artery stenosis 10/2008   40-59% R 60-79%   . Chest pain, unspecified   . Chronic airway obstruction, not elsewhere classified   . Chronic bronchitis ([place])    "get it some; not q year" (11/11/2012)  . Chronic diastolic CHF (congestive heart failure) ([place]) 06/04/2013  . Chronic kidney disease, unspecified   . Coronary artery disease    non-obstructive by 2006 cath  . Dysuria   . Edema   . Encounter for therapeutic drug monitoring   . Exertional shortness of breath   . Frequent UTI    "get them a couple times/yr" (11/11/2012)  . Hemorrhage of rectum and anus   . High cholesterol   . History of blood transfusion 04/2011   "after hip OR" (11/11/2012)  . Hypertension 05/20/2011  . Hypertr obst cardiomyop   . Hypotension, unspecified    cardiac cath 2006..nonobstructive CAD 30-40s lesions.Marland KitchenETT 1/09 nondiagnostic due to poor HR response..Right Renal Cancer 2003  . Long term (current) use of anticoagulants   . Malignant neoplasm of kidney, except pelvis   .  Osteoarthritis of right hip   . Other acute sinusitis   . Other and unspecified coagulation defects   . Other malaise and fatigue   . Other screening mammogram   . PONV (postoperative nausea and vomiting)   . Pre-operative cardiovascular examination   . Renal cancer ([place]) 06/2001   Right  . Secondary cardiomyopathy, unspecified   . Sinus bradycardia   . Special screening for osteoporosis   . Urge incontinence    Past Surgical History:  Procedure Laterality Date  . ABDOMINAL HYSTERECTOMY  1975   for benign causes  . APPENDECTOMY    . BI-VENTRICULAR PACEMAKER UPGRADE  05/04/2010  . CARDIAC CATHETERIZATION  2006  . CATARACT EXTRACTION W/ INTRAOCULAR LENS  IMPLANT, BILATERAL  01/2006-02-2006  . CHOLECYSTECTOMY N/A 11/11/2012   Procedure: LAPAROSCOPIC CHOLECYSTECTOMY WITH INTRAOPERATIVE CHOLANGIOGRAM;  Surgeon: Imogene Burn. Georgette Dover, MD;  Location: [facility];  Service: General;  Laterality: N/A;  . EP IMPLANTABLE DEVICE N/A 02/21/2016   Procedure: ICD Generator Changeout;  Surgeon: Deboraha Sprang, MD;  Location: [facility];  Service: Cardiovascular;  Laterality: N/A;  . INSERT / REPLACE / REMOVE PACEMAKER  05-01-11   02-28-05-/05-04-10-ICD-MEDTRONIC MAXIMAL DR  . JOINT REPLACEMENT    . LAPAROSCOPIC CHOLECYSTECTOMY  11/11/2012  . LAPAROSCOPIC LYSIS OF ADHESIONS N/A 11/11/2012   Procedure: LAPAROSCOPIC LYSIS OF ADHESIONS;  Surgeon: Imogene Burn. Georgette Dover, MD;  Location: [facility];  Service:  General;  Laterality: N/A;  . NEPHRECTOMY Right 06/2001    S/P RENAL CELL CANCER  . TOTAL HIP ARTHROPLASTY Right 05/03/2011   Procedure: TOTAL HIP ARTHROPLASTY ANTERIOR APPROACH;  Surgeon: Mcarthur Rossetti;  [facility];  Service: Orthopedics;  Laterality: Right;  Removal of Cannulated Screws Right Hip, Right Direct Anterior Hip Replacement   Family History  Problem Relation Age of Onset  . Heart failure Mother   . Breast cancer Maternal Aunt 82  . Breast cancer Cousin   . Colon cancer Neg Hx    Social  History  Substance Use Topics  . Smoking status: Former Smoker    Packs/day: 0.50    Years: 40.00    Types: Cigarettes    Quit date: 05/06/2001  . Smokeless tobacco: Never Used  . Alcohol use No      Review of Systems Per HPI    Objective:   Physical Exam  Constitutional: She is oriented to person, place, and time. She appears well-developed and well-nourished. No distress.  HENT:  Head: Normocephalic and atraumatic.  Cardiovascular: Normal rate, regular rhythm and normal heart sounds.   Pulmonary/Chest: Effort normal and breath sounds normal.  Abdominal: Soft. She exhibits no distension. There is no tenderness. There is no rebound, no guarding and no CVA tenderness.  Musculoskeletal: She exhibits no edema.  Neurological: She is alert and oriented to person, place, and time.  Skin: Skin is warm and dry. She is not diaphoretic.  Vitals reviewed.     BP (!) 144/64   Pulse 78   Temp 97.6 F (36.4 C) (Oral)   Ht 5\' 6"  (1.676 m)  Wt Readings from Last 3 Encounters:  12/24/16 186 lb (84.4 kg)  11/18/16 188 lb (85.3 kg)  11/07/16 184 lb 8 oz (83.7 kg)   Results for orders placed or performed in visit on 02/07/17  Urine Culture  Result Value Ref Range   MICRO NUMBER: [id number]    SPECIMEN QUALITY: ADEQUATE    Sample Source URINE    STATUS: PRELIMINARY    ISOLATE 1: Escherichia coli (A)   POCT Urinalysis Dipstick  Result Value Ref Range   Color, UA yellow    Clarity, UA clear    Glucose, UA neg    Bilirubin, UA neg    Ketones, UA neg    Spec Grav, UA 1.015 1.010 - 1.025   Blood, UA neg    pH, UA 6.5 5.0 - 8.0   Protein, UA neg    Urobilinogen, UA 0.2 0.2 or 1.0 E.U./dL   Nitrite, UA neg    Leukocytes, UA Moderate (2+) (A) Negative       Assessment & Plan:  1. Dysuria - POCT Urinalysis Dipstick - Urine Culture  2. Cystitis - Provided written and verbal information regarding diagnosis and treatment. - RTC/ER precautions reviewed - cephALEXin (KEFLEX) 500  MG capsule; Take 1 capsule (500 mg total) by mouth 2 (two) times daily.  Dispense: 14 capsule; Refill: 0  3. Need for immunization against influenza - Flu Vaccine QUAD 36+ mos IM  4. Chronic diastolic CHF (congestive heart failure) ([place]) - discussed sildenafil use and she plans to restart to see if breathing improves   Clarene Reamer, FNP-BC  [facility], [place]  02/09/2017 9:35 PM

## 2017-02-07 NOTE — Patient Instructions (Signed)
Pre visit review using our clinic review tool, if applicable. No additional management support is needed unless otherwise documented below in the visit note. 

## 2017-02-09 ENCOUNTER — Encounter: Payer: Self-pay | Admitting: Family Medicine

## 2017-02-10 LAB — URINE CULTURE
MICRO NUMBER:: 81110581
SPECIMEN QUALITY:: ADEQUATE

## 2017-02-14 NOTE — Progress Notes (Signed)
Visit reviewed and agree with plan for Vitamin B12 injection.

## 2017-02-17 NOTE — Progress Notes (Signed)
Cardiology Office Note  Date:  02/18/2017   ID:  [name], DOB February 28, 1934, MRN [id number]  PCP:  [name] Passy, MD   Chief Complaint  Patient presents with  . other    6 month follwo up. Patient c/o chest pain, SOB, and swelling. Meds reviewed verbally with patient.     HPI:  [name] is a 83 yo woman With a history of syncope,  symptomatic bradycardia, status post ICD.  nonobstructive coronary artery disease with chronic chest pain,  bilateral carotid artery stenosis 60-70% disease on the left, paroxysmal atrial fibrillation,  renal cell carcinoma status post resection of her right kidney with residual chronic renal insufficiency,  COPD, stopped smoking over 10 years ago. smoked for more than 30 years chronic leg pain single kidney, creatinine 1.4 She presents for routine follow-up of her carotid arterial disease, atrial fibrillation.  In follow-up today she reports that she does not feel well at times, etiology unclear Frequent UTIs, constant battle Sometimes with abdominal pain In general she is sedentary, no regular exercise program  Typically Taking lasix 40 BID Sometimes misses doses, will eat out with friends  Little flutter in heart, Not very symptomatic ICD download reviewed with episodes of atrial fib/tach, results discussed with her  Optivol typically runs low She has been taking high dose Lasix for leg swelling Leg swelling felt secondary to venous insufficiency. Does not wear compression hose  EKG personally reviewed by myself on todays visit Shows paced rhythm 66 bpm, QTc 566  Other past medical history reviewed previous episode of chest pain, chronic shortness of breath Echocardiogram 2016 and most recently 2017 with elevated right heart pressures, 70 mmHg   periodic symptoms of chest tightness, shortness of breath  Echocardiogram results reviewed with her in detail, elevated right heart pressures, normal ejection fraction  Carotid  ultrasound reviewed with her showing stable moderate to severe bilateral carotid disease Other past medical history  She did try flecainide in the past and she had significant side effects as well as breakthrough arrhythmia.  right hip replacement on May 03 2011.  Chronic dizziness since the summer of 2014 . Symptoms were worse in December 2014  Echocardiogram showed normal ejection fraction, moderate mitral valve regurgitation, moderate TR, moderately elevated right ventricular systolic pressures.  stress test in March 2011.   Last carotid u/s 40-59% R 60-79%. No neuro symptoms.   Cardiac cath 2006 --non-obstructive CAD 30-40s lesions She reports having repeat catheterization since that time with no significant progression of her disease   PMH:   has a past medical history of Adjustment disorder with anxiety; Anginal pain ([place]); Arthritis; Atrial fibrillation ([place]); Automatic implantable cardioverter-defibrillator in situ; Blood in stool; Carotid artery stenosis (09/2007); Carotid artery stenosis (10/2008); Chest pain, unspecified; Chronic airway obstruction, not elsewhere classified; Chronic bronchitis ([place]); Chronic diastolic CHF (congestive heart failure) ([place]) (06/04/2013); Chronic kidney disease, unspecified; Coronary artery disease; Dysuria; Edema; Encounter for therapeutic drug monitoring; Exertional shortness of breath; Frequent UTI; Hemorrhage of rectum and anus; High cholesterol; History of blood transfusion (04/2011); Hypertension (05/20/2011); Hypertr obst cardiomyop; Hypotension, unspecified; Long term (current) use of anticoagulants; Malignant neoplasm of kidney, except pelvis; Osteoarthritis of right hip; Other acute sinusitis; Other and unspecified coagulation defects; Other malaise and fatigue; Other screening mammogram; PONV (postoperative nausea and vomiting); Pre-operative cardiovascular examination; Renal cancer ([place]) (06/2001); Secondary cardiomyopathy,  unspecified; Sinus bradycardia; Special screening for osteoporosis; and Urge incontinence.  PSH:    Past Surgical History:  Procedure Laterality Date  .  ABDOMINAL HYSTERECTOMY  1975   for benign causes  . APPENDECTOMY    . BI-VENTRICULAR PACEMAKER UPGRADE  05/04/2010  . CARDIAC CATHETERIZATION  2006  . CATARACT EXTRACTION W/ INTRAOCULAR LENS  IMPLANT, BILATERAL  01/2006-02-2006  . CHOLECYSTECTOMY N/A 11/11/2012   Procedure: LAPAROSCOPIC CHOLECYSTECTOMY WITH INTRAOPERATIVE CHOLANGIOGRAM;  Surgeon: [name]. [name] Dover, MD;  Location: [place];  Service: General;  Laterality: N/A;  . EP IMPLANTABLE DEVICE N/A 02/21/2016   Procedure: ICD Generator Changeout;  Surgeon: Deboraha Sprang, MD;  Location: [facility] [place] [facility];  Service: Cardiovascular;  Laterality: N/A;  . INSERT / REPLACE / REMOVE PACEMAKER  05-01-11   02-28-05-/05-04-10-ICD-MEDTRONIC MAXIMAL DR  . JOINT REPLACEMENT    . LAPAROSCOPIC CHOLECYSTECTOMY  11/11/2012  . LAPAROSCOPIC LYSIS OF ADHESIONS N/A 11/11/2012   Procedure: LAPAROSCOPIC LYSIS OF ADHESIONS;  Surgeon: [name]. [name] Dover, MD;  Location: [facility];  Service: General;  Laterality: N/A;  . NEPHRECTOMY Right 06/2001    S/P RENAL CELL CANCER  . TOTAL HIP ARTHROPLASTY Right 05/03/2011   Procedure: TOTAL HIP ARTHROPLASTY ANTERIOR APPROACH;  Surgeon: [name];  [facility];  Service: Orthopedics;  Laterality: Right;  Removal of Cannulated Screws Right Hip, Right Direct Anterior Hip Replacement    Current Outpatient Prescriptions  Medication Sig Dispense Refill  . acetaminophen (TYLENOL) 500 MG tablet Take 1,000 mg by mouth every 6 (six) hours as needed for headache.    . carvedilol (COREG) 12.5 MG tablet TAKE 1 TABLET BY MOUTH TWICE A DAY WITH A MEAL 60 tablet 6  . cetirizine (ZYRTEC) 10 MG tablet Take 10 mg by mouth daily.     . disopyramide (NORPACE) 150 MG capsule TAKE 1 CAPSULE BY MOUTH TWICE DAILY 60 capsule 3  . fluticasone (FLONASE) 50 MCG/ACT nasal spray Place 2  sprays into both nostrils daily. 16 g 6  . furosemide (LASIX) 40 MG tablet Take 1 tablet (40 mg total) by mouth 3 (three) times daily. 90 tablet 10  . rosuvastatin (CRESTOR) 40 MG tablet Take 1 tablet (40 mg total) by mouth daily. 90 tablet 3  . sildenafil (REVATIO) 20 MG tablet Take 1 tablet (20 mg total) by mouth 3 (three) times daily. 90 tablet 6  . warfarin (COUMADIN) 2 MG tablet Take daily as directed by coumadin clinic 130 tablet 0   Current Facility-Administered Medications  Medication Dose Route Frequency Provider Last Rate Last Dose  . cyanocobalamin ((VITAMIN B-12)) injection 1,000 mcg  1,000 mcg Intramuscular Q30 days [name] Passy, MD   1,000 mcg at 11/01/16 1400     Allergies:   Codeine; Morphine and related; and Sulfonamide derivatives   Social History:  The patient  reports that she quit smoking about 15 years ago. Her smoking use included Cigarettes. She has a 20.00 pack-year smoking history. She has never used smokeless tobacco. She reports that she does not drink alcohol or use drugs.   Family History:   family history includes Breast cancer in her cousin; Breast cancer (age of onset: 38) in her maternal aunt; Heart failure in her mother.    Review of Systems: Review of Systems  Constitutional: Positive for malaise/fatigue.  Respiratory: Negative.   Cardiovascular: Negative.   Gastrointestinal: Negative.   Musculoskeletal: Negative.   Neurological: Negative.   Psychiatric/Behavioral: Negative.   All other systems reviewed and are negative.    PHYSICAL EXAM: VS:  BP 130/60 (BP Location: Left Arm, Patient Position: Sitting, Cuff Size: Normal)   Pulse 66   Ht 5\' 6"  (  1.676 m)   Wt 191 lb 4 oz (86.8 kg)   BMI 30.87 kg/m  , BMI Body mass index is 30.87 kg/m. GEN: Well nourished, well developed, in no acute distress  HEENT: normal  Neck: no JVD, carotid bruits, or masses Cardiac: RRR; no murmurs, rubs, or gallops,no edema  Respiratory:  clear to auscultation  bilaterally, normal work of breathing GI: soft, nontender, nondistended, + BS MS: no deformity or atrophy  Skin: warm and dry, no rash Neuro:  Strength and sensation are intact Psych: euthymic mood, full affect    Recent Labs: 03/19/2016: ALT 10 11/07/2016: BUN 17; Creatinine, Ser 1.50; Potassium 4.0; Sodium 139; TSH 2.88 11/18/2016: Hemoglobin 10.4; Platelets 165.0    Lipid Panel Lab Results  Component Value Date   CHOL 179 03/19/2016   HDL 39.10 03/19/2016   LDLCALC 118 (H) 03/19/2016   TRIG 110.0 03/19/2016      Wt Readings from Last 3 Encounters:  02/18/17 191 lb 4 oz (86.8 kg)  12/24/16 186 lb (84.4 kg)  11/18/16 188 lb (85.3 kg)       ASSESSMENT AND PLAN:  Malaise Etiology unclear, recommended she try to coordinate her heart rhythm with her symptoms. Less likely atrial fibrillation, She feels symptoms more likely secondary to chronic UTI  Cardiac device in situ device change out by Dr. Caryl Comes  periodic atrial fibrillation/tachycardia On warfarin  Pure hypercholesterolemia Tolerating Crestor 40 mg daily, stable  Atrial fibrillation, unspecified type ([place]) On anticoagulation, no complications from bleeding  Bilateral carotid artery stenosis Last evaluated 06/2016 Stable 40-59% disease on the left  Essential hypertension Blood pressure is well controlled on today's visit. No changes made to the medications. stable  Chronic diastolic CHF (congestive heart failure) ([facility]) Slight worsening of renal function, Denies shortness of breath,  We'll continue current diuretic regiment  Pulmonary hypertension ([place]) Severe underlying lung disease from 30+ years of smoking Currently on diuretics    Total encounter time more than 25 minutes  Greater than 50% was spent in counseling and coordination of care with the patient   Disposition:   F/U  6 months    Signed, Esmond Plants, M.D., Ph.D. 02/18/2017  [place],  [place]

## 2017-02-18 ENCOUNTER — Ambulatory Visit (INDEPENDENT_AMBULATORY_CARE_PROVIDER_SITE_OTHER): Payer: PPO | Admitting: Cardiovascular Disease

## 2017-02-18 ENCOUNTER — Encounter: Payer: Self-pay | Admitting: Cardiovascular Disease

## 2017-02-18 VITALS — BP 130/60 | HR 66 | Ht 66.0 in | Wt 191.2 lb

## 2017-02-18 DIAGNOSIS — Z95810 Presence of automatic (implantable) cardiac defibrillator: Secondary | ICD-10-CM | POA: Diagnosis not present

## 2017-02-18 DIAGNOSIS — I481 Persistent atrial fibrillation: Secondary | ICD-10-CM | POA: Diagnosis not present

## 2017-02-18 DIAGNOSIS — I6523 Occlusion and stenosis of bilateral carotid arteries: Secondary | ICD-10-CM

## 2017-02-18 DIAGNOSIS — J432 Centrilobular emphysema: Secondary | ICD-10-CM | POA: Diagnosis not present

## 2017-02-18 DIAGNOSIS — N184 Chronic kidney disease, stage 4 (severe): Secondary | ICD-10-CM

## 2017-02-18 DIAGNOSIS — E782 Mixed hyperlipidemia: Secondary | ICD-10-CM | POA: Diagnosis not present

## 2017-02-18 DIAGNOSIS — I4819 Other persistent atrial fibrillation: Secondary | ICD-10-CM

## 2017-02-18 NOTE — Patient Instructions (Signed)
Medication Instructions:   No medication changes made  Labwork:  No new labs needed  Testing/Procedures:  No further testing at this time   Follow-Up: It was a pleasure seeing you in the office today. Please call us if you have new issues that need to be addressed before your next appt.  [phone]  Your physician wants you to follow-up in: 6 months.  You will receive a reminder letter in the mail two months in advance. If you don't receive a letter, please call our office to schedule the follow-up appointment.  If you need a refill on your cardiac medications before your next appointment, please call your pharmacy.     

## 2017-02-20 ENCOUNTER — Ambulatory Visit (INDEPENDENT_AMBULATORY_CARE_PROVIDER_SITE_OTHER): Payer: PPO | Admitting: *Deleted

## 2017-02-20 DIAGNOSIS — I5032 Chronic diastolic (congestive) heart failure: Secondary | ICD-10-CM | POA: Diagnosis not present

## 2017-02-20 DIAGNOSIS — Z95810 Presence of automatic (implantable) cardiac defibrillator: Secondary | ICD-10-CM

## 2017-02-20 DIAGNOSIS — I428 Other cardiomyopathies: Secondary | ICD-10-CM

## 2017-02-20 NOTE — Progress Notes (Signed)
EPIC Encounter for ICM Monitoring  Patient Name: [name] is a 83 y.o. female Date: 02/20/2017 Primary Care Physican: Lucille Passy, MD Primary Cardiologist: Rockey Situ Electrophysiologist: Faustino Congress Weight:191lbs   Clinical Status (20-Jan-2017 to 20-Feb-2017) Treated VT/VF 0 episodes  AT/AF 347 episodes  Time in AT/AF 4.3 hr/day (18.1%) Longest AT/AF 19 hours     Heart Failure questions reviewed, pt symptomatic with shortness of breath in the last week and weight gain of 3 lbs.  Patient has cut back on Furosemide per Dr Donivan Scull request and taking once a day but can take 2nd dosage.    Thoracic impedance slightly abnormal suggesting fluid accumulation.  Prescribed dosage: Furosemide 40 mg 1 tablet 3 times a day. Does not always take 3 tablets daily as prescribed. Patient self adjusts Furosemide as needed   Labs: 11/07/2016 Creatinine 1.50, BUN 17, Potassium 4.0, Sodium 139, EGFR 35.45 06/03/2016 Creatinine 1.61, BUN 15, Potassium 4.6, Sodium 141, EGFR 30-35 10/26/2017Creatinine 1.63, BUN 19, Potassium 3.5, Sodium 139, EGFR 29-33  03/19/2016 Creatinine 1.59, BUN 20, Potassium 4.1, Sodium 141  02/12/2016 Creatinine 1.70, BUN 20, Potassium 3.8, Sodium 139  12/26/2015 Creatinine 1.54, BUN 17, Potassium 4.0, Sodium 139, EGFR 31-36  07/27/2015 Creatinine 1.30, BUN 14, Potassium 4.9, Sodium 143, EGFR 39-45  Recommendations:  Advised take Furosemide 40 mg tablet twice a day x 2 days  Follow-up plan: ICM clinic phone appointment on 02/27/2017.  Office appointment scheduled 03/04/2017 with Dr. Caryl Comes.  Copy of ICM check sent to Dr. Caryl Comes and Dr. Rockey Situ.   3 month ICM trend: 02/20/2017   1 Year ICM trend:      Rosalene Billings, RN 02/20/2017 1:29 PM

## 2017-02-20 NOTE — Progress Notes (Signed)
Remote ICD transmission.   

## 2017-02-25 ENCOUNTER — Encounter: Payer: PPO | Admitting: Internal Medicine

## 2017-02-26 ENCOUNTER — Other Ambulatory Visit: Payer: Self-pay | Admitting: Cardiovascular Disease

## 2017-02-27 ENCOUNTER — Encounter: Payer: Self-pay | Admitting: Cardiology

## 2017-02-27 ENCOUNTER — Ambulatory Visit (INDEPENDENT_AMBULATORY_CARE_PROVIDER_SITE_OTHER): Payer: Self-pay

## 2017-02-27 DIAGNOSIS — Z95810 Presence of automatic (implantable) cardiac defibrillator: Secondary | ICD-10-CM

## 2017-02-27 DIAGNOSIS — I5032 Chronic diastolic (congestive) heart failure: Secondary | ICD-10-CM

## 2017-02-27 NOTE — Progress Notes (Signed)
EPIC Encounter for ICM Monitoring  Patient Name: [name] is a 83 y.o. female Date: 02/27/2017 Primary Care Physican: Lucille Passy, MD Primary Cardiologist: Rockey Situ Electrophysiologist: Faustino Congress Weight:186lbs    Clinical Status (20-Feb-2017 to 27-Feb-2017) Treated VT/VF 0 episodes  AT/AF 216 episodes  Time in AT/AF 18.1 hr/day  Observations (1) (20-Feb-2017 to 27-Feb-2017)  AT/AF >= 6 hr for 5 days.      Heart Failure questions reviewed, extra Furosemide relieved breathing difficulty at night and lost 5 lbs.       Thoracic impedance returned to normal after taking Furosemide 40 mg tablet twice a day x 2 days.  Prescribed dosage: Furosemide 40 mg 1 tablet 3 times a day. Does not always take 3 tablets daily as prescribed. Patient self adjusts Furosemide as needed.   Labs: 11/07/2016 Creatinine 1.50, BUN 17, Potassium 4.0, Sodium 139, EGFR 35.45 06/03/2016 Creatinine 1.61, BUN 15, Potassium 4.6, Sodium 141, EGFR 30-35 10/26/2017Creatinine 1.63, BUN 19, Potassium 3.5, Sodium 139, EGFR 29-33  03/19/2016 Creatinine 1.59, BUN 20, Potassium 4.1, Sodium 141  02/12/2016 Creatinine 1.70, BUN 20, Potassium 3.8, Sodium 139  12/26/2015 Creatinine 1.54, BUN 17, Potassium 4.0, Sodium 139, EGFR 31-36  07/27/2015 Creatinine 1.30, BUN 14, Potassium 4.9, Sodium 143, EGFR 39-45  Recommendations: No changes.  Encouraged to call for fluid symptoms.  Follow-up plan: ICM clinic phone appointment on 04/04/2017.  Office appointment scheduled 03/04/2017 with Dr. Caryl Comes.  Copy of ICM check sent to Dr. Caryl Comes.   3 month ICM trend: 02/27/2017   1 Year ICM trend:      Rosalene Billings, RN 02/27/2017 1:37 PM

## 2017-02-27 NOTE — Progress Notes (Signed)
Letter  

## 2017-02-28 LAB — CUP PACEART REMOTE DEVICE CHECK
Battery Remaining Longevity: 82 mo
Battery Voltage: 3 V
Brady Statistic AP VP Percent: 82.89 %
Brady Statistic AP VS Percent: 0.13 %
Brady Statistic AS VP Percent: 15.41 %
Brady Statistic AS VS Percent: 1.57 %
Brady Statistic RA Percent Paced: 79.88 %
Brady Statistic RV Percent Paced: 98.18 %
Date Time Interrogation Session: 20181018073424
HighPow Impedance: 35 Ohm
HighPow Impedance: 40 Ohm
Implantable Lead Implant Date: 20061026
Implantable Lead Implant Date: 20111230
Implantable Lead Location: 753859
Implantable Lead Location: 753860
Implantable Lead Model: 5076
Implantable Lead Model: 6947
Implantable Pulse Generator Implant Date: 20171018
Lead Channel Impedance Value: 361 Ohm
Lead Channel Impedance Value: 418 Ohm
Lead Channel Impedance Value: 418 Ohm
Lead Channel Pacing Threshold Amplitude: 0.75 V
Lead Channel Pacing Threshold Pulse Width: 0.4 ms
Lead Channel Sensing Intrinsic Amplitude: 0.625 mV
Lead Channel Sensing Intrinsic Amplitude: 0.625 mV
Lead Channel Sensing Intrinsic Amplitude: 10.25 mV
Lead Channel Sensing Intrinsic Amplitude: 10.25 mV
Lead Channel Setting Pacing Amplitude: 2.5 V
Lead Channel Setting Pacing Amplitude: 2.5 V
Lead Channel Setting Pacing Pulse Width: 0.4 ms
Lead Channel Setting Sensing Sensitivity: 0.3 mV

## 2017-03-04 ENCOUNTER — Encounter: Payer: Self-pay | Admitting: Internal Medicine

## 2017-03-04 ENCOUNTER — Ambulatory Visit (INDEPENDENT_AMBULATORY_CARE_PROVIDER_SITE_OTHER): Payer: PPO | Admitting: Internal Medicine

## 2017-03-04 VITALS — BP 140/66 | HR 67 | Ht 67.0 in | Wt 189.0 lb

## 2017-03-04 DIAGNOSIS — I482 Chronic atrial fibrillation: Secondary | ICD-10-CM | POA: Diagnosis not present

## 2017-03-04 DIAGNOSIS — Z95810 Presence of automatic (implantable) cardiac defibrillator: Secondary | ICD-10-CM | POA: Diagnosis not present

## 2017-03-04 DIAGNOSIS — Z959 Presence of cardiac and vascular implant and graft, unspecified: Secondary | ICD-10-CM | POA: Diagnosis not present

## 2017-03-04 DIAGNOSIS — I428 Other cardiomyopathies: Secondary | ICD-10-CM | POA: Diagnosis not present

## 2017-03-04 DIAGNOSIS — I480 Paroxysmal atrial fibrillation: Secondary | ICD-10-CM | POA: Diagnosis not present

## 2017-03-04 NOTE — Patient Instructions (Addendum)
Medication Instructions:  Your physician recommends that you continue on your current medications as directed. Please refer to the Current Medication list given to you today.   Labwork: BMET, CBC  Testing/Procedures: Remote monitoring is used to monitor your Pacemaker of ICD from home. This monitoring reduces the number of office visits required to check your device to one time per year. It allows [place] to keep an eye on the functioning of your device to ensure it is working properly. You are scheduled for a device check from home on May 22, 2017. You may send your transmission at any time that day. If you have a wireless device, the transmission will be sent automatically. After your physician reviews your transmission, you will receive a postcard with your next transmission date.    Follow-Up: Your physician wants you to follow-up in: 6 months with Dr. Gari Crown will receive a reminder letter in the mail two months in advance. If you don't receive a letter, please call our office to schedule the follow-up appointment.   Any Other Special Instructions Will Be Listed Below (If Applicable).     If you need a refill on your cardiac medications before your next appointment, please call your pharmacy.

## 2017-03-04 NOTE — Progress Notes (Signed)
Patient Care Team: Lucille Passy, MD as PCP - General Rockey Situ Kathlene November, MD as Consulting Physician (Cardiology) Deboraha Sprang, MD as Consulting Physician (Cardiology) Delana Meyer, Dolores Lory, MD as Consulting Physician (Vascular Surgery)   HPI  [name] is a 83 y.o. female Seen in followup for nonischemic cardiomyopathy for which she has a primary prevention ICD implanted initially 2006 with generator replacement about 5 years ago and replacement again 10/17   There had been puffiness and concern re infection;  cultures at change out were neg.  The pain at the site has significantly improved      She has paroxysmal atrial fibrillation; previously  treated with disopyramide changed to  flecainide. But 2/2 side effects  back on disopyramide. ( the d/c summary 4/16 was WRONG-describing the use of amiodarone)  She's had infrequent Afib.  She has a CHADS-VASc score of greater than or equal to 6 (age-15, gender-1, TIA-2 (4/16), heart failure-1)   She's had problems with intermittent peripheral edema-- been responsive to diuretics   Some SOB but no CP   DATE TEST    1/15 Echo EF 65 % Mod Pulm HTN, TR and Mild MR   4/16 Echo  EF 65 % Mod-sev PulmHTN with RVE, Mod MR severe TR  6/17 Echo EF 65% Mild-mod MR     Cath 2006 without obstructive disease    Date Cr K Hgb  12/17 1.63 3.5 9.5  7/18 1.5 4.0 10.4   Ferritin 7/18 was 19.1    Past Medical History:  Diagnosis Date  . Adjustment disorder with anxiety   . Anginal pain ([place])   . Arthritis    "some in my hands" (11/11/2012)  . Atrial fibrillation ([place])   . Automatic implantable cardioverter-defibrillator in situ   . Blood in stool   . Carotid artery stenosis 09/2007   60-79% bilateral (stable)  . Carotid artery stenosis 10/2008   40-59% R 60-79%   . Chest pain, unspecified   . Chronic airway obstruction, not elsewhere classified   . Chronic bronchitis ([place])    "get it some; not q year" (11/11/2012)  . Chronic  diastolic CHF (congestive heart failure) ([place]) 06/04/2013  . Chronic kidney disease, unspecified   . Coronary artery disease    non-obstructive by 2006 cath  . Dysuria   . Edema   . Encounter for therapeutic drug monitoring   . Exertional shortness of breath   . Frequent UTI    "get them a couple times/yr" (11/11/2012)  . Hemorrhage of rectum and anus   . High cholesterol   . History of blood transfusion 04/2011   "after hip OR" (11/11/2012)  . Hypertension 05/20/2011  . Hypertr obst cardiomyop   . Hypotension, unspecified    cardiac cath 2006..nonobstructive CAD 30-40s lesions.Marland KitchenETT 1/09 nondiagnostic due to poor HR response..Right Renal Cancer 2003  . Long term (current) use of anticoagulants   . Malignant neoplasm of kidney, except pelvis   . Osteoarthritis of right hip   . Other acute sinusitis   . Other and unspecified coagulation defects   . Other malaise and fatigue   . Other screening mammogram   . PONV (postoperative nausea and vomiting)   . Pre-operative cardiovascular examination   . Renal cancer ([place]) 06/2001   Right  . Secondary cardiomyopathy, unspecified   . Sinus bradycardia   . Special screening for osteoporosis   . Urge incontinence     Past Surgical History:  Procedure Laterality  Date  . ABDOMINAL HYSTERECTOMY  1975   for benign causes  . APPENDECTOMY    . BI-VENTRICULAR PACEMAKER UPGRADE  05/04/2010  . CARDIAC CATHETERIZATION  2006  . CATARACT EXTRACTION W/ INTRAOCULAR LENS  IMPLANT, BILATERAL  01/2006-02-2006  . CHOLECYSTECTOMY N/A 11/11/2012   Procedure: LAPAROSCOPIC CHOLECYSTECTOMY WITH INTRAOPERATIVE CHOLANGIOGRAM;  Surgeon: Imogene Burn. Georgette Dover, MD;  Location[facility];  Service: General;  Laterality: N/A;  . EP IMPLANTABLE DEVICE N/A 02/21/2016   Procedure: ICD Generator Changeout;  Surgeon: Deboraha Sprang, MD;  Location: [facility];  Service: Cardiovascular;  Laterality: N/A;  . INSERT / REPLACE / REMOVE PACEMAKER  05-01-11    02-28-05-/05-04-10-ICD-MEDTRONIC MAXIMAL DR  . JOINT REPLACEMENT    . LAPAROSCOPIC CHOLECYSTECTOMY  11/11/2012  . LAPAROSCOPIC LYSIS OF ADHESIONS N/A 11/11/2012   Procedure: LAPAROSCOPIC LYSIS OF ADHESIONS;  Surgeon: Imogene Burn. Georgette Dover, MD;  Location: [facility];  Service: General;  Laterality: N/A;  . NEPHRECTOMY Right 06/2001    S/P RENAL CELL CANCER  . TOTAL HIP ARTHROPLASTY Right 05/03/2011   Procedure: TOTAL HIP ARTHROPLASTY ANTERIOR APPROACH;  Surgeon: Mcarthur Rossetti;  Location: [facility];  Service: Orthopedics;  Laterality: Right;  Removal of Cannulated Screws Right Hip, Right Direct Anterior Hip Replacement    Current Outpatient Prescriptions  Medication Sig Dispense Refill  . acetaminophen (TYLENOL) 500 MG tablet Take 1,000 mg by mouth every 6 (six) hours as needed for headache.    . carvedilol (COREG) 12.5 MG tablet TAKE 1 TABLET BY MOUTH TWICE A DAY WITH A MEAL 60 tablet 6  . cetirizine (ZYRTEC) 10 MG tablet Take 10 mg by mouth daily.     . disopyramide (NORPACE) 150 MG capsule TAKE 1 CAPSULE BY MOUTH TWICE DAILY 60 capsule 3  . fluticasone (FLONASE) 50 MCG/ACT nasal spray Place 2 sprays into both nostrils daily. 16 g 6  . furosemide (LASIX) 40 MG tablet TAKE 1 TABLET BY MOUTH 3 TIMES DAILY 90 tablet 0  . rosuvastatin (CRESTOR) 40 MG tablet Take 1 tablet (40 mg total) by mouth daily. 90 tablet 3  . sildenafil (REVATIO) 20 MG tablet Take 1 tablet (20 mg total) by mouth 3 (three) times daily. 90 tablet 6  . warfarin (COUMADIN) 2 MG tablet Take daily as directed by coumadin clinic 130 tablet 0   Current Facility-Administered Medications  Medication Dose Route Frequency Provider Last Rate Last Dose  . cyanocobalamin ((VITAMIN B-12)) injection 1,000 mcg  1,000 mcg Intramuscular Q30 days Lucille Passy, MD   1,000 mcg at 11/01/16 1400    Allergies  Allergen Reactions  . Codeine Nausea And Vomiting  . Morphine And Related Nausea And Vomiting  . Sulfonamide Derivatives Other (See Comments)     REACTION: Dry mouth    Review of Systems negative except from HPI and PMH  Physical Exam BP 140/66 (BP Location: Left Arm, Patient Position: Sitting, Cuff Size: Normal)   Pulse 67   Ht 5\' 7"  (1.702 m)   Wt 189 lb (85.7 kg)   BMI 29.60 kg/m  Well developed and nourished in no acute distress HENT normal Neck supple with JVP7-8 Clear Regular rate and rhythm, no murmurs or gallops Abd-soft with active BS No Clubbing cyanosis tr edema Skin-warm and dry A & Oriented  Grossly normal sensory and motor function   ECG  Personally reviewed  demonstrates    Assessment and  Plan  Nonischemic cardiomyopathy with interval normalization of LV function  Sinus node dysfunction device dependent   Complete  heart block intermittent  Atrial fibrillation persistent  High risk medication surveillance-disopyramide  Device/pocket related chest pain    Implantable defibrillator   Her pocket remains a concern. All the cultures were negative at generator replacement.  I'm concerned about the urinary tract infection and her lack of full recovery. It raises the possibility of bacteremia and secondary seeding of the defibrillator. This is relatively uncommon with Escherichia coli probably 5-10% of the time. However, if her symptoms do not fully resolve, prior to re-initiation of antibiotics I would ask Dr. Marjory Lies to get 2 blood cultures.   The fact that she is having ongoing pocket discomfort also raises the possibility of a device pocket infection notwithstanding the lack of signs area the fact that her pocket feels better is encouraging; the fact that it feels better in the context of antibiotics mitigates any comfort.  She's having problems with congestive heart failure. We have reviewed salt and fluid and use of her diuretics.  More than 50% of 45 min was spent in counseling related to the above \

## 2017-03-05 LAB — CBC

## 2017-03-05 LAB — BASIC METABOLIC PANEL
BUN/Creatinine Ratio: 11 — ABNORMAL LOW (ref 12–28)
BUN: 17 mg/dL (ref 8–27)
CO2: 20 mmol/L (ref 20–29)
Calcium: 9.6 mg/dL (ref 8.7–10.3)
Chloride: 100 mmol/L (ref 96–106)
Creatinine, Ser: 1.53 mg/dL — ABNORMAL HIGH (ref 0.57–1.00)
GFR calc Af Amer: 37 mL/min/{1.73_m2} — ABNORMAL LOW (ref >59)
GFR calc non Af Amer: 32 mL/min/{1.73_m2} — ABNORMAL LOW (ref >59)
Glucose: 81 mg/dL (ref 65–99)
Potassium: 4.5 mmol/L (ref 3.5–5.2)
Sodium: 142 mmol/L (ref 134–144)

## 2017-03-06 ENCOUNTER — Encounter: Payer: Self-pay | Admitting: Internal Medicine

## 2017-03-06 ENCOUNTER — Ambulatory Visit (INDEPENDENT_AMBULATORY_CARE_PROVIDER_SITE_OTHER): Payer: PPO | Admitting: Internal Medicine

## 2017-03-06 ENCOUNTER — Other Ambulatory Visit (INDEPENDENT_AMBULATORY_CARE_PROVIDER_SITE_OTHER): Payer: PPO

## 2017-03-06 VITALS — BP 134/80 | HR 64 | Temp 97.6°F | Wt 188.5 lb

## 2017-03-06 DIAGNOSIS — I482 Chronic atrial fibrillation, unspecified: Secondary | ICD-10-CM

## 2017-03-06 DIAGNOSIS — N3010 Interstitial cystitis (chronic) without hematuria: Secondary | ICD-10-CM

## 2017-03-06 DIAGNOSIS — R300 Dysuria: Secondary | ICD-10-CM | POA: Diagnosis not present

## 2017-03-06 LAB — POC URINALSYSI DIPSTICK (AUTOMATED)
Bilirubin, UA: NEGATIVE
Glucose, UA: NEGATIVE
Ketones, UA: NEGATIVE
Leukocytes, UA: NEGATIVE
Nitrite, UA: NEGATIVE
Protein, UA: NEGATIVE
Spec Grav, UA: 1.015 (ref 1.010–1.025)
Urobilinogen, UA: 0.2 E.U./dL
pH, UA: 6 (ref 5.0–8.0)

## 2017-03-06 MED ORDER — NITROFURANTOIN MONOHYD MACRO 100 MG PO CAPS: 100.0000 mg | ORAL_CAPSULE | Freq: Two times a day (BID) | ORAL | 0 refills | Status: DC

## 2017-03-06 NOTE — Addendum Note (Signed)
Addended by: Lurlean Nanny on: 03/06/2017 11:56 AM   Modules accepted: Orders

## 2017-03-06 NOTE — Patient Instructions (Signed)
Dysuria Dysuria is pain or discomfort while urinating. The pain or discomfort may be felt in the tube that carries urine out of the bladder (urethra) or in the surrounding tissue of the genitals. The pain may also be felt in the groin area, lower abdomen, and lower back. You may have to urinate frequently or have the sudden feeling that you have to urinate (urgency). Dysuria can affect both men and women, but is more common in women. Dysuria can be caused by many different things, including:  Urinary tract infection in women.  Infection of the kidney or bladder.  Kidney stones or bladder stones.  Certain sexually transmitted infections (STIs), such as chlamydia.  Dehydration.  Inflammation of the vagina.  Use of certain medicines.  Use of certain soaps or scented products that cause irritation.  Follow these instructions at home: Watch your dysuria for any changes. The following actions may help to reduce any discomfort you are feeling:  Drink enough fluid to keep your urine clear or pale yellow.  Empty your bladder often. Avoid holding urine for long periods of time.  After a bowel movement or urination, women should cleanse from front to back, using each tissue only once.  Empty your bladder after sexual intercourse.  Take medicines only as directed by your health care provider.  If you were prescribed an antibiotic medicine, finish it all even if you start to feel better.  Avoid caffeine, tea, and alcohol. They can irritate the bladder and make dysuria worse. In men, alcohol may irritate the prostate.  Keep all follow-up visits as directed by your health care provider. This is important.  If you had any tests done to find the cause of dysuria, it is your responsibility to obtain your test results. Ask the lab or department performing the test when and how you will get your results. Talk with your health care provider if you have any questions about your results.  Contact a  health care provider if:  You develop pain in your back or sides.  You have a fever.  You have nausea or vomiting.  You have blood in your urine.  You are not urinating as often as you usually do. Get help right away if:  You pain is severe and not relieved with medicines.  You are unable to hold down any fluids.  You or someone else notices a change in your mental function.  You have a rapid heartbeat at rest.  You have shaking or chills.  You feel extremely weak. This information is not intended to replace advice given to you by your health care provider. Make sure you discuss any questions you have with your health care provider. Document Released: 01/19/2004 Document Revised: 09/28/2015 Document Reviewed: 12/16/2013 Elsevier Interactive Patient Education  2018 Elsevier Inc.  

## 2017-03-06 NOTE — Addendum Note (Signed)
Addended by: Mady Haagensen on: 03/06/2017 11:39 AM   Modules accepted: Orders

## 2017-03-06 NOTE — Progress Notes (Signed)
HPI  Pt presents to the clinic today with c/o dysuria. She reports this started 4 days ago. She denies urgency, frequency or blood in her urine. She denies fever, chills, nausea or low back pan. She denies vaginal complaints. She reports she gets UTI's frequently. She was last treated with Keflex 02/07/17. Culture at that time grew out E Coli. She has an appt with a urologist on November 13th.   Review of Systems  Past Medical History:  Diagnosis Date  . Adjustment disorder with anxiety   . Anginal pain ([place])   . Arthritis    "some in my hands" (11/11/2012)  . Atrial fibrillation ([place])   . Automatic implantable cardioverter-defibrillator in situ   . Blood in stool   . Carotid artery stenosis 09/2007   60-79% bilateral (stable)  . Carotid artery stenosis 10/2008   40-59% R 60-79%   . Chest pain, unspecified   . Chronic airway obstruction, not elsewhere classified   . Chronic bronchitis ([place])    "get it some; not q year" (11/11/2012)  . Chronic diastolic CHF (congestive heart failure) ([place]) 06/04/2013  . Chronic kidney disease, unspecified   . Coronary artery disease    non-obstructive by 2006 cath  . Dysuria   . Edema   . Encounter for therapeutic drug monitoring   . Exertional shortness of breath   . Frequent UTI    "get them a couple times/yr" (11/11/2012)  . Hemorrhage of rectum and anus   . High cholesterol   . History of blood transfusion 04/2011   "after hip OR" (11/11/2012)  . Hypertension 05/20/2011  . Hypertr obst cardiomyop   . Hypotension, unspecified    cardiac cath 2006..nonobstructive CAD 30-40s lesions.Marland KitchenETT 1/09 nondiagnostic due to poor HR response..Right Renal Cancer 2003  . Long term (current) use of anticoagulants   . Malignant neoplasm of kidney, except pelvis   . Osteoarthritis of right hip   . Other acute sinusitis   . Other and unspecified coagulation defects   . Other malaise and fatigue   . Other screening mammogram   . PONV (postoperative nausea and  vomiting)   . Pre-operative cardiovascular examination   . Renal cancer ([place]) 06/2001   Right  . Secondary cardiomyopathy, unspecified   . Sinus bradycardia   . Special screening for osteoporosis   . Urge incontinence     Family History  Problem Relation Age of Onset  . Heart failure Mother   . Breast cancer Maternal Aunt 82  . Breast cancer Cousin   . Colon cancer Neg Hx     Social History   Social History  . Marital status: Widowed    Spouse name: N/A  . Number of children: N/A  . Years of education: N/A   Occupational History  . Retired Retired   Social History Main Topics  . Smoking status: Former Smoker    Packs/day: 0.50    Years: 40.00    Types: Cigarettes    Quit date: 05/06/2001  . Smokeless tobacco: Never Used  . Alcohol use No  . Drug use: No  . Sexual activity: No   Other Topics Concern  . 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   ALCOHOL USE-NO   RETIRED   FORMER TOBACCO USE.....1/2 PACK X [place]  2003   Would desire CPR    Allergies  Allergen Reactions  . Codeine Nausea And Vomiting  . Morphine And Related Nausea And Vomiting  . Sulfonamide Derivatives Other (See Comments)    REACTION: Dry mouth     Constitutional: Denies fever, malaise, fatigue, headache or abrupt weight changes.   GU: Pt reports pain with urination. Denies urgency, frequency,  burning sensation, blood in urine, odor or discharge. Skin: Denies redness, rashes, lesions or ulcercations.   No other specific complaints in a complete review of systems (except as listed in HPI above).    Objective:   Physical Exam  BP 134/80   Pulse 64   Temp 97.6 F (36.4 C) (Oral)   Wt 188 lb 8 oz (85.5 kg)   SpO2 98%   BMI 29.52 kg/m   Wt Readings from Last 3 Encounters:  03/06/17 188 lb 8 oz (85.5 kg)   03/04/17 189 lb (85.7 kg)  02/18/17 191 lb 4 oz (86.8 kg)    General: Appears her stated age, well developed, well nourished in NAD. Abdomen: Soft. Normal bowel sounds. No distention or masses noted.  Tender to palpation over the bladder area. No CVA tenderness.       Assessment & Plan:   Dysuria:   Urinalysis: trace blood Will send urine culture eRx sent if for Macrobid 100 mg BID x 5 days OK to take AZO OTC Drink plenty of fluids  RTC as needed or if symptoms persist. Webb Silversmith, NP

## 2017-03-07 LAB — URINE CULTURE
MICRO NUMBER:: 81227447
SPECIMEN QUALITY:: ADEQUATE

## 2017-03-07 LAB — CBC
Hematocrit: 32.2 % — ABNORMAL LOW (ref 34.0–46.6)
Hemoglobin: 9.8 g/dL — ABNORMAL LOW (ref 11.1–15.9)
MCH: 23.1 pg — ABNORMAL LOW (ref 26.6–33.0)
MCHC: 30.4 g/dL — ABNORMAL LOW (ref 31.5–35.7)
MCV: 76 fL — ABNORMAL LOW (ref 79–97)
Platelets: 215 10*3/uL (ref 150–379)
RBC: 4.24 x10E6/uL (ref 3.77–5.28)
RDW: 17.4 % — ABNORMAL HIGH (ref 12.3–15.4)
WBC: 3.6 10*3/uL (ref 3.4–10.8)

## 2017-03-14 ENCOUNTER — Encounter: Payer: Self-pay | Admitting: Family Medicine

## 2017-03-14 ENCOUNTER — Ambulatory Visit: Payer: PPO

## 2017-03-14 ENCOUNTER — Ambulatory Visit: Payer: PPO | Admitting: Family Medicine

## 2017-03-14 VITALS — BP 140/70 | HR 86 | Temp 97.3°F | Wt 190.5 lb

## 2017-03-14 DIAGNOSIS — R5381 Other malaise: Secondary | ICD-10-CM

## 2017-03-14 DIAGNOSIS — R300 Dysuria: Secondary | ICD-10-CM | POA: Diagnosis not present

## 2017-03-14 DIAGNOSIS — R5383 Other fatigue: Secondary | ICD-10-CM

## 2017-03-14 LAB — CUP PACEART INCLINIC DEVICE CHECK
Battery Remaining Longevity: 84 mo
Battery Voltage: 2.97 V
Brady Statistic AP VP Percent: 87.79 %
Brady Statistic AP VS Percent: 0.08 %
Brady Statistic AS VP Percent: 11.05 %
Brady Statistic AS VS Percent: 1.09 %
Brady Statistic RA Percent Paced: 85.47 %
Brady Statistic RV Percent Paced: 98.73 %
Date Time Interrogation Session: 20181030155520
HighPow Impedance: 38 Ohm
HighPow Impedance: 49 Ohm
Implantable Lead Implant Date: 20061026
Implantable Lead Implant Date: 20111230
Implantable Lead Location: 753859
Implantable Lead Location: 753860
Implantable Lead Model: 5076
Implantable Lead Model: 6947
Implantable Pulse Generator Implant Date: 20171018
Lead Channel Impedance Value: 361 Ohm
Lead Channel Impedance Value: 456 Ohm
Lead Channel Impedance Value: 456 Ohm
Lead Channel Pacing Threshold Amplitude: 0.75 V
Lead Channel Pacing Threshold Amplitude: 1.25 V
Lead Channel Pacing Threshold Pulse Width: 0.4 ms
Lead Channel Pacing Threshold Pulse Width: 0.4 ms
Lead Channel Sensing Intrinsic Amplitude: 0.75 mV
Lead Channel Sensing Intrinsic Amplitude: 11 mV
Lead Channel Setting Pacing Amplitude: 2.5 V
Lead Channel Setting Pacing Amplitude: 2.5 V
Lead Channel Setting Pacing Pulse Width: 0.4 ms
Lead Channel Setting Sensing Sensitivity: 0.3 mV

## 2017-03-14 LAB — POCT URINALYSIS DIPSTICK
Bilirubin, UA: NEGATIVE
Blood, UA: NEGATIVE
Glucose, UA: NEGATIVE
Ketones, UA: NEGATIVE
Leukocytes, UA: NEGATIVE
Nitrite, UA: NEGATIVE
Protein, UA: NEGATIVE
Spec Grav, UA: 1.015 (ref 1.010–1.025)
Urobilinogen, UA: 0.2 E.U./dL
pH, UA: 6.5 (ref 5.0–8.0)

## 2017-03-14 NOTE — Patient Instructions (Signed)
Good to see you today  Please schedule follow up in 6 months

## 2017-03-14 NOTE — Progress Notes (Signed)
Subjective:    Patient ID: [name], female    DOB: 23-Jul-1933, 83 y.o.   MRN: [id number]  HPI This is an 82 yo female who presents with continued dysuria. Was seen 03/06/17 and urine dipstick and culture were negative. She has been having chronic UTIs and has appointment with urology next week.  She is having pain at the beginning of urination, worse in the morning, back is hurting and she feels "terrible."  Has been having low back and lower abdominal pain. Having daily BM, no diarrhea. Rarely misses a day. Mild nausea. Sleeps well, about 8 hours a night. Doesn't feel rested in the morning. This is longstanding. She reports good and bad days. Some days has more energy. 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 Sat for her tasks, but was able to make it through the day without difficulty.   Past Medical History:  Diagnosis Date  . Adjustment disorder with anxiety   . Anginal pain ([place])   . Arthritis    "some in my hands" (11/11/2012)  . Atrial fibrillation ([place])   . Automatic implantable cardioverter-defibrillator in situ   . Blood in stool   . Carotid artery stenosis 09/2007   60-79% bilateral (stable)  . Carotid artery stenosis 10/2008   40-59% R 60-79%   . Chest pain, unspecified   . Chronic airway obstruction, not elsewhere classified   . Chronic bronchitis ([place])    "get it some; not q year" (11/11/2012)  . Chronic diastolic CHF (congestive heart failure) ([place]) 06/04/2013  . Chronic kidney disease, unspecified   . Coronary artery disease    non-obstructive by 2006 cath  . Dysuria   . Edema   . Encounter for therapeutic drug monitoring   . Exertional shortness of breath   . Frequent UTI    "get them a couple times/yr" (11/11/2012)  . Hemorrhage of rectum and anus   . High cholesterol   . History of blood transfusion 04/2011   "after hip OR" (11/11/2012)  . Hypertension 05/20/2011  . Hypertr obst cardiomyop   . Hypotension, unspecified    cardiac cath  2006..nonobstructive CAD 30-40s lesions.Marland KitchenETT 1/09 nondiagnostic due to poor HR response..Right Renal Cancer 2003  . Long term (current) use of anticoagulants   . Malignant neoplasm of kidney, except pelvis   . Osteoarthritis of right hip   . Other acute sinusitis   . Other and unspecified coagulation defects   . Other malaise and fatigue   . Other screening mammogram   . PONV (postoperative nausea and vomiting)   . Pre-operative cardiovascular examination   . Renal cancer ([place]) 06/2001   Right  . Secondary cardiomyopathy, unspecified   . Sinus bradycardia   . Special screening for osteoporosis   . Urge incontinence    Past Surgical History:  Procedure Laterality Date  . ABDOMINAL HYSTERECTOMY  1975   for benign causes  . APPENDECTOMY    . BI-VENTRICULAR PACEMAKER UPGRADE  05/04/2010  . CARDIAC CATHETERIZATION  2006  . CATARACT EXTRACTION W/ INTRAOCULAR LENS  IMPLANT, BILATERAL  01/2006-02-2006  . INSERT / REPLACE / REMOVE PACEMAKER  05-01-11   02-28-05-/05-04-10-ICD-MEDTRONIC MAXIMAL DR  . JOINT REPLACEMENT    . LAPAROSCOPIC CHOLECYSTECTOMY  11/11/2012  . NEPHRECTOMY Right 06/2001    S/P RENAL CELL CANCER  . TOTAL HIP ARTHROPLASTY Right 05/03/2011   Procedure: TOTAL HIP ARTHROPLASTY ANTERIOR APPROACH;  Surgeon: Mcarthur Rossetti;  Location: [facility];  Service: Orthopedics;  Laterality: Right;  Removal of Cannulated Screws Right Hip, Right Direct Anterior Hip Replacement   Family History  Problem Relation Age of Onset  . Heart failure Mother   . Breast cancer Maternal Aunt 82  . Breast cancer Cousin   . Colon cancer Neg Hx    Social History   Tobacco Use  . Smoking status: Former Smoker    Packs/day: 0.50    Years: 40.00    Pack years: 20.00    Types: Cigarettes    Last attempt to quit: 05/06/2001    Years since quitting: 15.8  . Smokeless tobacco: Never Used  Substance Use Topics  . Alcohol use: No    Alcohol/week: 0.0 oz  . Drug use: No      Review of  Systems per HPI    Objective:   Physical Exam  Constitutional: She is oriented to person, place, and time. She appears well-developed and well-nourished. No distress.  Cardiovascular: Normal rate.  Pulmonary/Chest: Effort normal.  Neurological: She is alert and oriented to person, place, and time.  Skin: Skin is warm and dry. She is not diaphoretic.  Psychiatric: She has a normal mood and affect. Her behavior is normal. Judgment and thought content normal.  Vitals reviewed.     BP 140/70 (BP Location: Left Arm, Patient Position: Sitting, Cuff Size: Normal)   Pulse 86   Temp (!) 97.3 F (36.3 C) (Oral)   Wt 190 lb 8 oz (86.4 kg)   SpO2 96%   BMI 29.84 kg/m  Wt Readings from Last 3 Encounters:  03/14/17 190 lb 8 oz (86.4 kg)  03/06/17 188 lb 8 oz (85.5 kg)  03/04/17 189 lb (85.7 kg)   Results for orders placed or performed in visit on 03/14/17  POCT urinalysis dipstick  Result Value Ref Range   Color, UA yellow    Clarity, UA clear    Glucose, UA neg    Bilirubin, UA neg    Ketones, UA neg    Spec Grav, UA 1.015 1.010 - 1.025   Blood, UA neg    pH, UA 6.5 5.0 - 8.0   Protein, UA neg    Urobilinogen, UA 0.2 0.2 or 1.0 E.U./dL   Nitrite, UA neg    Leukocytes, UA Negative Negative        Assessment & Plan:  1. Dysuria - POCT urinalysis dipstick- negative - dipstick negative today and was negative with negative culture at last visit, reassured patient - follow up as scheduled with urology next week  2. Malaise and fatigue - this is an ongoing concern for her and likely multifactoral related to her multiple chronic conditions- anemia, CHF, afib on beta blocker, CRI - discussed activity and pacing through day - recent labs done in last 10 days stable, no need for labs today   Clarene Reamer, FNP-BC  [facility], [place]  03/15/2017 7:57 AM

## 2017-03-15 ENCOUNTER — Encounter: Payer: Self-pay | Admitting: Family Medicine

## 2017-03-18 ENCOUNTER — Encounter: Payer: Self-pay | Admitting: Urology

## 2017-03-18 ENCOUNTER — Ambulatory Visit: Payer: PPO | Admitting: Urology

## 2017-03-18 ENCOUNTER — Ambulatory Visit: Payer: PPO

## 2017-03-18 VITALS — BP 118/57 | HR 79 | Ht 66.0 in | Wt 190.0 lb

## 2017-03-18 DIAGNOSIS — Z85528 Personal history of other malignant neoplasm of kidney: Secondary | ICD-10-CM | POA: Diagnosis not present

## 2017-03-18 DIAGNOSIS — N390 Urinary tract infection, site not specified: Secondary | ICD-10-CM | POA: Diagnosis not present

## 2017-03-18 DIAGNOSIS — R3915 Urgency of urination: Secondary | ICD-10-CM | POA: Diagnosis not present

## 2017-03-18 LAB — URINALYSIS, COMPLETE
Bilirubin, UA: NEGATIVE
Glucose, UA: NEGATIVE
Ketones, UA: NEGATIVE
Nitrite, UA: NEGATIVE
Specific Gravity, UA: 1.025 (ref 1.005–1.030)
Urobilinogen, Ur: 1 mg/dL (ref 0.2–1.0)
pH, UA: 6.5 (ref 5.0–7.5)

## 2017-03-18 LAB — MICROSCOPIC EXAMINATION

## 2017-03-18 LAB — BLADDER SCAN AMB NON-IMAGING

## 2017-03-18 MED ORDER — ESTRADIOL 0.1 MG/GM VA CREA: 1.0000 g | TOPICAL_CREAM | VAGINAL | 12 refills | Status: DC

## 2017-03-18 NOTE — Patient Instructions (Addendum)
Cranberry tab twice daily  Probiotic daily OR yogurt with live active culture  Estrogen cream three times per week at night.  Pea size amount on the urethra.  Wash hands!  Renal ultrasound- will call with results tomorrow alteration

## 2017-03-18 NOTE — Progress Notes (Signed)
03/18/2017 2:48 PM   [name] [birth year] [id number]  Referring provider: Lucille Passy, MD [place], [place] [address]  Chief Complaint  Patient presents with  . Recurrent UTI    New Patient    HPI: 83 yo referred for further evaluation of recurrent urinary tract infections.  She has had at least 7 UTIs over the past year.  Each of these infections have been symptomatic with urinary urgency, frequency, dysuria, pelvic pain, low back pain, low-grade temps.  She denies any significant kidney infections, high fevers, or history of pyelonephritis.  Review of urine culture data reveal at least 5 E. coli urinary tract infections resistant to ampicillin, Zosyn, Cipro, gentamicin, Levaquin, and tobramycin.  Orally, sensitive only to Bactrim, Macrobid, and Augmentin.  She is also had several Klebsiella infections as well.  She does recall having several UTIs last year, but prior to a few years ago, these were fairly rare.  No personal history of kidney stones.  She does complain of low back pain, primarly on the left side.   This is worse when she has a UTI.   No recent renal imaging.  She has seen gross blood with infection.  No gross hematuria in absence of infection.    She does wear a pad for SUI which she changes frequency for heigne 5x which not saturated.  She does have urinary frequrency/ urgency related to lasix.    She does a personal hisotry of kidney cancer s//p R nephrectomy by Dr. Yves Dill in 2003.  No recurrence of kidney cancer.     PMH: Past Medical History:  Diagnosis Date  . Adjustment disorder with anxiety   . Anginal pain ([place])   . Arthritis    "some in my hands" (11/11/2012)  . Atrial fibrillation ([place])   . Automatic implantable cardioverter-defibrillator in situ   . Blood in stool   . Carotid artery stenosis 09/2007   60-79% bilateral (stable)  . Carotid artery stenosis 10/2008   40-59% R 60-79%   . Chest pain, unspecified   .  Chronic airway obstruction, not elsewhere classified   . Chronic bronchitis ([place])    "get it some; not q year" (11/11/2012)  . Chronic diastolic CHF (congestive heart failure) ([place]) 06/04/2013  . Chronic kidney disease, unspecified   . Coronary artery disease    non-obstructive by 2006 cath  . Dysuria   . Edema   . Encounter for therapeutic drug monitoring   . Exertional shortness of breath   . Frequent UTI    "get them a couple times/yr" (11/11/2012)  . Hemorrhage of rectum and anus   . High cholesterol   . History of blood transfusion 04/2011   "after hip OR" (11/11/2012)  . Hypertension 05/20/2011  . Hypertr obst cardiomyop   . Hypotension, unspecified    cardiac cath 2006..nonobstructive CAD 30-40s lesions.Marland KitchenETT 1/09 nondiagnostic due to poor HR response..Right Renal Cancer 2003  . Long term (current) use of anticoagulants   . Malignant neoplasm of kidney, except pelvis   . Osteoarthritis of right hip   . Other acute sinusitis   . Other and unspecified coagulation defects   . Other malaise and fatigue   . Other screening mammogram   . PONV (postoperative nausea and vomiting)   . Pre-operative cardiovascular examination   . Renal cancer ([place]) 06/2001   Right  . Secondary cardiomyopathy, unspecified   . Sinus bradycardia   . Special screening for osteoporosis   . Urge  incontinence     Surgical History: Past Surgical History:  Procedure Laterality Date  . ABDOMINAL HYSTERECTOMY  1975   for benign causes  . APPENDECTOMY    . BI-VENTRICULAR PACEMAKER UPGRADE  05/04/2010  . CARDIAC CATHETERIZATION  2006  . CATARACT EXTRACTION W/ INTRAOCULAR LENS  IMPLANT, BILATERAL  01/2006-02-2006  . ICD Generator Changeout N/A 02/21/2016   Performed by Deboraha Sprang, MD at [facility]  . INSERT / REPLACE / REMOVE PACEMAKER  05-01-11   02-28-05-/05-04-10-ICD-MEDTRONIC MAXIMAL DR  . JOINT REPLACEMENT    . LAPAROSCOPIC CHOLECYSTECTOMY  11/11/2012  . LAPAROSCOPIC CHOLECYSTECTOMY WITH  INTRAOPERATIVE CHOLANGIOGRAM N/A 11/11/2012   Performed by Maia Petties., MD at [facility]  . LAPAROSCOPIC LYSIS OF ADHESIONS N/A 11/11/2012   Performed by Maia Petties., MD at [facility] Right 06/2001    S/P RENAL CELL CANCER  . TOTAL HIP ARTHROPLASTY Right 05/03/2011   Procedure: TOTAL HIP ARTHROPLASTY ANTERIOR APPROACH;  Surgeon: Mcarthur Rossetti;  Location: [facility];  Service: Orthopedics;  Laterality: Right;  Removal of Cannulated Screws Right Hip, Right Direct Anterior Hip Replacement  . TOTAL HIP ARTHROPLASTY ANTERIOR APPROACH Right 05/03/2011   Performed by Mcarthur Rossetti, MD at [facility]    Home Medications:  Allergies as of 03/18/2017      Reactions   Codeine Nausea And Vomiting   Morphine And Related Nausea And Vomiting   Sulfonamide Derivatives Other (See Comments)   REACTION: Dry mouth      Medication List        Accurate as of 03/18/17 11:59 PM. Always use your most recent med list.          acetaminophen 500 MG tablet Commonly known as:  TYLENOL Take 1,000 mg by mouth every 6 (six) hours as needed for headache.   carvedilol 12.5 MG tablet Commonly known as:  COREG TAKE 1 TABLET BY MOUTH TWICE A DAY WITH A MEAL   cetirizine 10 MG tablet Commonly known as:  ZYRTEC Take 10 mg by mouth daily.   disopyramide 150 MG capsule Commonly known as:  NORPACE TAKE 1 CAPSULE BY MOUTH TWICE DAILY   estradiol 0.1 MG/GM vaginal cream Commonly known as:  ESTRACE Place 1 g 3 (three) times a week vaginally. Pea size about per urethra   fluticasone 50 MCG/ACT nasal spray Commonly known as:  FLONASE Place 2 sprays into both nostrils daily.   furosemide 40 MG tablet Commonly known as:  LASIX TAKE 1 TABLET BY MOUTH 3 TIMES DAILY   rosuvastatin 40 MG tablet Commonly known as:  CRESTOR Take 1 tablet (40 mg total) by mouth daily.   sildenafil 20 MG tablet Commonly known as:  REVATIO Take 1 tablet (20 mg total) by mouth 3 (three) times daily.     warfarin 2 MG tablet Commonly known as:  COUMADIN Take as directed by the anticoagulation clinic. If you are unsure how to take this medication, talk to your nurse or doctor. Original instructions:  Take daily as directed by coumadin clinic       Allergies:  Allergies  Allergen Reactions  . Codeine Nausea And Vomiting  . Morphine And Related Nausea And Vomiting  . Sulfonamide Derivatives Other (See Comments)    REACTION: Dry mouth    Family History: Family History  Problem Relation Age of Onset  . Heart failure Mother   . Breast cancer Maternal Aunt 82  . Breast cancer Cousin   . Colon cancer  Neg Hx     Social History:  reports that she quit smoking about 15 years ago. Her smoking use included cigarettes. She has a 20.00 pack-year smoking history. she has never used smokeless tobacco. She reports that she does not drink alcohol or use drugs.  ROS: UROLOGY Frequent Urination?: Yes Hard to postpone urination?: Yes Burning/pain with urination?: Yes Get up at night to urinate?: No Leakage of urine?: Yes Urine stream starts and stops?: No Trouble starting stream?: No Do you have to strain to urinate?: No Blood in urine?: Yes Urinary tract infection?: Yes Sexually transmitted disease?: No Injury to kidneys or bladder?: No Painful intercourse?: No Weak stream?: No Currently pregnant?: No Vaginal bleeding?: Yes Last menstrual period?: n  Gastrointestinal Nausea?: Yes Vomiting?: No Indigestion/heartburn?: Yes Diarrhea?: No Constipation?: No  Constitutional Fever: No Night sweats?: No Weight loss?: No Fatigue?: Yes  Skin Skin rash/lesions?: No Itching?: No  Eyes Blurred vision?: No Double vision?: No  Ears/Nose/Throat Sore throat?: No Sinus problems?: No  Hematologic/Lymphatic Swollen glands?: No Easy bruising?: No  Cardiovascular Leg swelling?: No Chest pain?: No  Respiratory Cough?: No Shortness of breath?: Yes  Endocrine Excessive  thirst?: No  Musculoskeletal Back pain?: Yes Joint pain?: Yes  Neurological Headaches?: No Dizziness?: Yes  Psychologic Depression?: No Anxiety?: No  Physical Exam: BP (!) 118/57   Pulse 79   Ht 5\' 6"  (1.676 m)   Wt 190 lb (86.2 kg)   BMI 30.67 kg/m   Constitutional:  Alert and oriented, No acute distress. HEENT: [place] AT, moist mucus membranes.  Trachea midline, no masses. Cardiovascular: No clubbing, cyanosis, or edema. Respiratory: Normal respiratory effort, no increased work of breathing. GI: Abdomen is soft, nontender, nondistended, no abdominal masses.  Laparoscopic umbilical port site appreciated as well as small right lower quadrant incision which may represent hand port site but no flank scar appreciated. GU: No CVA tenderness.  Skin: No rashes, bruises or suspicious lesions. Neurologic: Grossly intact, no focal deficits, moving all 4 extremities. Psychiatric: Normal mood and affect.  Laboratory Data: Lab Results  Component Value Date   WBC 3.6 03/06/2017   HGB 9.8 (L) 03/06/2017   HCT 32.2 (L) 03/06/2017   MCV 76 (L) 03/06/2017   PLT 215 03/06/2017    Lab Results  Component Value Date   CREATININE 1.53 (H) 03/04/2017    Lab Results  Component Value Date   HGBA1C 6.1 (H) 08/17/2014    Urinalysis Results for orders placed or performed in visit on 03/18/17  CULTURE, URINE COMPREHENSIVE  Result Value Ref Range   Urine Culture, Comprehensive Final report (A)    Organism ID, Bacteria Escherichia coli (A)    ANTIMICROBIAL SUSCEPTIBILITY Comment   Microscopic Examination  Result Value Ref Range   WBC, UA 11-30 (A) 0 - 5 /hpf   RBC, UA 3-10 (A) 0 - 2 /hpf   Epithelial Cells (non renal) 0-10 0 - 10 /hpf   Mucus, UA Present (A) Not Estab.   Bacteria, UA Few (A) None seen/Few  Urinalysis, Complete  Result Value Ref Range   Specific Gravity, UA 1.025 1.005 - 1.030   pH, UA 6.5 5.0 - 7.5   Color, UA Yellow Yellow   Appearance Ur Cloudy (A) Clear    Leukocytes, UA 2+ (A) Negative   Protein, UA 1+ (A) Negative/Trace   Glucose, UA Negative Negative   Ketones, UA Negative Negative   RBC, UA 2+ (A) Negative   Bilirubin, UA Negative Negative   Urobilinogen, Ur 1.0  0.2 - 1.0 mg/dL   Nitrite, UA Negative Negative   Microscopic Examination See below:   BLADDER SCAN AMB NON-IMAGING  Result Value Ref Range   Scan Result 53ml     Pertinent Imaging: PVR 0  Assessment & Plan:    1. Recurrent UTI Multiple recurrent infections with E. coli and Klebsiella Unclear whether this is an incompletely treated infection or recurrent infection given fairly close interval between positive urine cultures No evidence of incomplete bladder emptying is contributing factor Recommend increasing fluids, daily probiotic, cranberry tabs twice daily, as well as the addition of topical estrogen cream Reviewed usage instructions for estrogen cream, pea-sized amount per urethra 3 times weekly Renal ultrasound to rule out anatomic issues which may be contributing to recurrent infections Consider cystoscopy to rule out bladder pathology frequency of infections continue Urine culture today, will treat as needed and place patient on suppressive antibiotic for short period of time to see if he can break the cycle Advised to return to our clinic on any occasion if she develops signs or symptoms of infection, will treat as needed - Urinalysis, Complete - BLADDER SCAN AMB NON-IMAGING - CULTURE, URINE COMPREHENSIVE  2. Urinary urgency Exacerbated by recurrent infections as well as  3. History of kidney cancer Personal history of renal cell carcinoma presumably status post nephrectomy although no flank incision appreciated No recent cross-sectional imaging Records requested Further evaluation with renal ultrasound - US RENAL; Future  Hollice Espy, MD  [facility] [address] [place], [place] [address] [phone]  I  spent 45 min with this patient of which greater than 50% was spent in counseling and coordination of care with the patient.

## 2017-03-20 LAB — CULTURE, URINE COMPREHENSIVE

## 2017-03-21 ENCOUNTER — Telehealth: Payer: Self-pay

## 2017-03-21 ENCOUNTER — Ambulatory Visit: Payer: PPO

## 2017-03-21 DIAGNOSIS — Z7902 Long term (current) use of antithrombotics/antiplatelets: Secondary | ICD-10-CM | POA: Diagnosis not present

## 2017-03-21 DIAGNOSIS — E538 Deficiency of other specified B group vitamins: Secondary | ICD-10-CM | POA: Diagnosis not present

## 2017-03-21 LAB — POCT INR: INR: 3

## 2017-03-21 MED ORDER — AMOXICILLIN-POT CLAVULANATE 875-125 MG PO TABS: 1.0000 | ORAL_TABLET | Freq: Two times a day (BID) | ORAL | 0 refills | Status: AC

## 2017-03-21 MED ORDER — TRIMETHOPRIM 100 MG PO TABS: 100.0000 mg | ORAL_TABLET | Freq: Every day | ORAL | 0 refills | Status: DC

## 2017-03-21 NOTE — Telephone Encounter (Signed)
Spoke with pt in reference to +ucx. Made aware to take augmentin bid x7 and trimethoprim daily x90days. Also made pt aware will need to follow up in 38mo. Pt stated she will call back for appt.

## 2017-03-21 NOTE — Telephone Encounter (Signed)
-----   Message from Hollice Espy, MD sent at 03/21/2017 10:51 AM EST ----- We sent a urine culture from the office which grew E. coli again.  I would like to treat her with Augmentin, standard dose for 1 week and then I would like her to switch to trimethoprim 100 mg daily for prophylaxis for 3 months.  I like to see her in about 4 months to see how she is doing.  We will call her when she has her renal ultrasound.  Hollice Espy, MD

## 2017-03-21 NOTE — Telephone Encounter (Signed)
Patient presents today in coumadin clinic for INR check.  INR 3.0   Overall patient is doing very well but does report some mild swelling and bruising over her right elbow.  Nursing Assessment:  - slight warmth but no redness - mild bruising (is on warfarin) - Fluid pocket and tenderness over right elbow but does have full ROM - Neg for any s/s for blood clot - highly suggestive of mild bursitis (patient reports that she has been doing excess baking and thinks arthritis has flared as a result of increased activity).  -  I did suggest RICE:  Rest, ice (no more than 47mins at a time bid), compression (apply an ace wrap to the area) and elevate as much as possible.  She cannot take OTC antiinflammatories due to warfarin use, but did suggest she use Tylenol PRN for pain and monitor through the weekend.    - Patient to call me Monday if not getting better or worse for an eval with a provider.  If becomes significantly worse or more painful over weekend, can go to urgent care for sooner eval .     - she did start amoxicillin today as ordered by her urologist for UTI.  Patient states her discomfort is very mild and more of a soreness than an actual pain.  Will forward to PCP for review of symptoms and let me know if you would advise any different protocol or follow up.  Thanks.

## 2017-03-21 NOTE — Patient Instructions (Addendum)
INR today 3.0  Continue taking 1 pill (2mg ) daily EXCEPT for 1.5 pills (3mg ) on Sundays, Tuesday and Saturdays.   Recheck in 1 week due to starting abx therapy.  *patient reports that she is starting amoxicillin X 7 days and then septra daily after that as prescribed by her urologist for ongoing UTI's.  Will monitor INR carefully and adjust dose as appropriate.  Will recheck INR in 4 days after starting abx therapy.

## 2017-03-22 NOTE — Telephone Encounter (Signed)
That sounds like a good plan.

## 2017-03-24 ENCOUNTER — Ambulatory Visit: Payer: Self-pay | Admitting: *Deleted

## 2017-03-24 NOTE — Progress Notes (Signed)
I have reviewed test results and agree with plan of care.

## 2017-03-24 NOTE — Telephone Encounter (Signed)
  Reason for Disposition . [1] Painful joint AND [2] no fever  Answer Assessment - Initial Assessment Questions 1. LOCATION: "Where is the swelling?" (e.g., left, right, both elbows)     Right arm  2. SIZE and DESCRIPTION: "What does the swelling look like?" (e.g., entire elbow, localized)     Up to 3/4 upper arm and down to about 3-4 inches above wrist 3. ONSET: "When did the swelling start?" "Does it come and go, or is it there all the time?"     Friday morning 4. SETTING: "Has there been any recent work, exercise or other activity that involved that part of the body?"      No, pt states he has not hit area 5. 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 ASSOCIATED SYMPTOMS: "Is there any pain or redness?"     Redness around elbow, elbow sore- 5on a scale of 1-10 7. OTHER SYMPTOMS: "Do you have any other symptoms?" (e.g., fever)     Itchy, warm to the touch  Protocols used: ELBOW SWELLING-A-AH  Pt has complaints of tenderness mostly in right elbow. Pt states she noticed swelling initially on Friday morning and since that time the area of swelling and discoloration has spread. Pt states discoloration is in her upper arm and almost down to the wrist. Pt also has complaints of the area being warm to touch, itchiness, and redness around the elbow and states she did not hit her elbow on anything or recall an insect bite to the area. Pt is also seen at the coumadin clinic.Pt also voiced concern of having a malignant place removed on the same arm two years ago. Pt encouraged to go to Urgent Care to be evaluated due to no appt availabilty on tomorrow 11/20. Pt verbalized understanding and states she will go to the Urgent Care.

## 2017-03-25 ENCOUNTER — Ambulatory Visit (INDEPENDENT_AMBULATORY_CARE_PROVIDER_SITE_OTHER): Payer: PPO

## 2017-03-25 ENCOUNTER — Other Ambulatory Visit: Payer: Self-pay | Admitting: Cardiovascular Disease

## 2017-03-25 ENCOUNTER — Ambulatory Visit: Payer: PPO | Admitting: Primary Care

## 2017-03-25 VITALS — BP 124/64 | HR 72 | Temp 98.2°F | Ht 66.0 in | Wt 188.1 lb

## 2017-03-25 DIAGNOSIS — R600 Localized edema: Secondary | ICD-10-CM

## 2017-03-25 DIAGNOSIS — Z7902 Long term (current) use of antithrombotics/antiplatelets: Secondary | ICD-10-CM

## 2017-03-25 LAB — POCT INR: INR: 2.9

## 2017-03-25 NOTE — Progress Notes (Signed)
Subjective:    Patient ID: [name], female    DOB: 02/14/1934, 83 y.o.   MRN: [id number]  HPI  Ms. Doepke is an 83 year old female with a history of atrial fibrillation managed on warfarin who presents today with a chief complaint of elbow swelling and bruising.  Her swelling and bruising is located to the right elbow and forearm that she first noticed 4 days ago. Also with discomfort to the olecranon that has been present for about the same time. INR check last week was 3.0. The practice RN did take notice of this and recommended further evaluation if no improvement.   She is currently managed on Augmentin for UTI for which she started Friday night (4 days ago). She was running low grade fevers just prior to diagnosis of UTI, no fever since. She's been applying cool compresses twice daily since Friday. Overall her swelling and bruising has decreased.   She denies chest pain, changes in breathing, calf pain/swelling. She is due for INR check today.   Review of Systems  Constitutional: Negative for fever.  Cardiovascular: Negative for chest pain.       Chronic lower extremity edema, no recent calf pain or swelling.  Skin: Positive for color change. Negative for rash and wound.       Past Medical History:  Diagnosis Date  . Adjustment disorder with anxiety   . Anginal pain ([place])   . Arthritis    "some in my hands" (11/11/2012)  . Atrial fibrillation ([place])   . Automatic implantable cardioverter-defibrillator in situ   . Blood in stool   . Carotid artery stenosis 09/2007   60-79% bilateral (stable)  . Carotid artery stenosis 10/2008   40-59% R 60-79%   . Chest pain, unspecified   . Chronic airway obstruction, not elsewhere classified   . Chronic bronchitis ([place])    "get it some; not q year" (11/11/2012)  . Chronic diastolic CHF (congestive heart failure) ([place]) 06/04/2013  . Chronic kidney disease, unspecified   . Coronary artery disease    non-obstructive by 2006 cath  .  Dysuria   . Edema   . Encounter for therapeutic drug monitoring   . Exertional shortness of breath   . Frequent UTI    "get them a couple times/yr" (11/11/2012)  . Hemorrhage of rectum and anus   . High cholesterol   . History of blood transfusion 04/2011   "after hip OR" (11/11/2012)  . Hypertension 05/20/2011  . Hypertr obst cardiomyop   . Hypotension, unspecified    cardiac cath 2006..nonobstructive CAD 30-40s lesions.Marland KitchenETT 1/09 nondiagnostic due to poor HR response..Right Renal Cancer 2003  . Long term (current) use of anticoagulants   . Malignant neoplasm of kidney, except pelvis   . Osteoarthritis of right hip   . Other acute sinusitis   . Other and unspecified coagulation defects   . Other malaise and fatigue   . Other screening mammogram   . PONV (postoperative nausea and vomiting)   . Pre-operative cardiovascular examination   . Renal cancer ([place]) 06/2001   Right  . Secondary cardiomyopathy, unspecified   . Sinus bradycardia   . Special screening for osteoporosis   . Urge incontinence      Social History   Socioeconomic History  . Marital status: Widowed    Spouse name: Not on file  . Number of children: Not on file  . Years of education: Not on file  . Highest education level: Not on file  Social Needs  . Financial resource strain: Not on file  . Food insecurity - worry: Not on file  . Food insecurity - inability: Not on file  . Transportation needs - medical: Not on file  . Transportation needs - non-medical: Not on file  Occupational History  . Occupation: Retired    Fish farm manager: RETIRED  Tobacco Use  . Smoking status: Former Smoker    Packs/day: 0.50    Years: 40.00    Pack years: 20.00    Types: Cigarettes    Last attempt to quit: 05/06/2001    Years since quitting: 15.8  . Smokeless tobacco: Never Used  Substance and Sexual Activity  . Alcohol use: No    Alcohol/week: 0.0 oz  . Drug use: No  . Sexual activity: No  Other Topics Concern  . Not on file    Social History Narrative   WIDOW   CHILDREN AND GRANDCHILDREN ALL LIVE CLOSE BY BUT SHE LIVES ALONE.   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   ALCOHOL USE-NO   RETIRED   FORMER TOBACCO USE.....1/2 PACK X 35 YRS UNTIL 2003   Would desire CPR    Past Surgical History:  Procedure Laterality Date  . ABDOMINAL HYSTERECTOMY  1975   for benign causes  . APPENDECTOMY    . BI-VENTRICULAR PACEMAKER UPGRADE  05/04/2010  . CARDIAC CATHETERIZATION  2006  . CATARACT EXTRACTION W/ INTRAOCULAR LENS  IMPLANT, BILATERAL  01/2006-02-2006  . ICD Generator Changeout N/A 02/21/2016   Performed by Deboraha Sprang, MD at [facility]  . INSERT / REPLACE / REMOVE PACEMAKER  05-01-11   02-28-05-/05-04-10-ICD-MEDTRONIC MAXIMAL DR  . JOINT REPLACEMENT    . LAPAROSCOPIC CHOLECYSTECTOMY  11/11/2012  . LAPAROSCOPIC CHOLECYSTECTOMY WITH INTRAOPERATIVE CHOLANGIOGRAM N/A 11/11/2012   Performed by Maia Petties., MD at [facility]  . LAPAROSCOPIC LYSIS OF ADHESIONS N/A 11/11/2012   Performed by Maia Petties., MD at [facility] Right 06/2001    S/P RENAL CELL CANCER  . TOTAL HIP ARTHROPLASTY Right 05/03/2011   Procedure: TOTAL HIP ARTHROPLASTY ANTERIOR APPROACH;  Surgeon: Mcarthur Rossetti;  Location: [facility]S;  Service: Orthopedics;  Laterality: Right;  Removal of Cannulated Screws Right Hip, Right Direct Anterior Hip Replacement  . TOTAL HIP ARTHROPLASTY ANTERIOR APPROACH Right 05/03/2011   Performed by Mcarthur Rossetti, MD at [facility] ORS    Family History  Problem Relation Age of Onset  . Heart failure Mother   . Breast cancer Maternal Aunt 82  . Breast cancer Cousin   . Colon cancer Neg Hx     Allergies  Allergen Reactions  . Codeine Nausea And Vomiting  . Morphine And Related Nausea And Vomiting  . Sulfonamide Derivatives Other (See Comments)    REACTION: Dry mouth    Current Outpatient Medications on File Prior to Visit   Medication Sig Dispense Refill  . acetaminophen (TYLENOL) 500 MG tablet Take 1,000 mg by mouth every 6 (six) hours as needed for headache.    Marland Kitchen amoxicillin-clavulanate (AUGMENTIN) 875-125 MG tablet Take 1 tablet 2 (two) times daily for 7 days by mouth. 14 tablet 0  . carvedilol (COREG) 12.5 MG tablet TAKE 1 TABLET BY MOUTH TWICE A DAY WITH A MEAL 60 tablet 6  . cetirizine (ZYRTEC) 10 MG tablet Take 10 mg by mouth daily.     . disopyramide (NORPACE) 150 MG capsule TAKE 1 CAPSULE BY MOUTH TWICE  DAILY 60 capsule 3  . estradiol (ESTRACE) 0.1 MG/GM vaginal cream Place 1 g 3 (three) times a week vaginally. Pea size about per urethra 42.5 g 12  . fluticasone (FLONASE) 50 MCG/ACT nasal spray Place 2 sprays into both nostrils daily. 16 g 6  . furosemide (LASIX) 40 MG tablet TAKE 1 TABLET BY MOUTH 3 TIMES DAILY 90 tablet 0  . rosuvastatin (CRESTOR) 40 MG tablet Take 1 tablet (40 mg total) by mouth daily. 90 tablet 3  . sildenafil (REVATIO) 20 MG tablet Take 1 tablet (20 mg total) by mouth 3 (three) times daily. 90 tablet 6  . trimethoprim (TRIMPEX) 100 MG tablet Take 1 tablet (100 mg total) daily by mouth. 90 tablet 0  . warfarin (COUMADIN) 2 MG tablet Take daily as directed by coumadin clinic 130 tablet 0  . [DISCONTINUED] hydrochlorothiazide (HYDRODIURIL) 25 MG tablet Take 25 mg by mouth daily after breakfast.      Current Facility-Administered Medications on File Prior to Visit  Medication Dose Route Frequency Provider Last Rate Last Dose  . cyanocobalamin ((VITAMIN B-12)) injection 1,000 mcg  1,000 mcg Intramuscular Q30 days Lucille Passy, MD   1,000 mcg at 03/21/17 1729    BP 124/64   Pulse 72   Temp 98.2 F (36.8 C) (Oral)   Ht 5\' 6"  (1.676 m)   Wt 188 lb 1.9 oz (85.3 kg)   SpO2 97%   BMI 30.36 kg/m    Objective:   Physical Exam  Constitutional: She appears well-nourished.  Neck: Neck supple.  Cardiovascular: Normal rate.  Pulmonary/Chest: Effort normal and breath sounds normal.   Skin: Skin is warm and dry.  Moderate dark blue bruising to right upper posterior forearm down to distal forearm. No open wounds. Mild-moderate swelling over right olecranon with flesh colored skin over site. Mild swelling to generalized forearm.           Assessment & Plan:  Right Upper Extremity Edema:  Also with bruising.  Could very well have been tennis elbow that seems to be resolving, do no recommend aspiration based off of exam.  Bruising not completely unexpected given history of warfarin use. Likely gravity causing "spread" of bruising to distal forearm.  Continue Augmentin as prescribed as she's noticed improvement since initiation. Continue cool compresses to site of bruising.  INR recheck today. She will update if no continued improvement.  Sheral Flow, NP

## 2017-03-25 NOTE — Patient Instructions (Signed)
Continue to apply cool compresses to the elbow and arm. Increase to three times daily compression.  Elevate your elbow to help decrease swelling.  Continue the antibiotics as prescribed.  Follow up with Mandy this morning for repeat INR.  It was a pleasure meeting you!

## 2017-03-25 NOTE — Patient Instructions (Addendum)
INR today 2.9  Continue taking 1 pill (2mg ) daily EXCEPT for 1.5 pills (3mg ) on Sundays, Tuesday and Saturdays.   Recheck in 2 weeks.    Patient does have some bruising to right arm and elbow after having a bursitis flare up this past week.  She saw NP today for eval and we will continue to monitor coumadin and bruising carefully to ensure healing; however, today it is showing signs of appropriate healing from eval last week.

## 2017-04-01 ENCOUNTER — Ambulatory Visit: Payer: PPO

## 2017-04-01 DIAGNOSIS — Z7902 Long term (current) use of antithrombotics/antiplatelets: Secondary | ICD-10-CM | POA: Diagnosis not present

## 2017-04-01 LAB — POCT INR: INR: 2.7

## 2017-04-01 NOTE — Patient Instructions (Signed)
INR today 2.7  Continue taking 1 pill (2mg ) daily EXCEPT for 1.5 pills (3mg ) on Sundays, Tuesday and Saturdays.   Patient has finished the augmentin but will be starting new abx therapy and cranberry tablets within the next week per urologist.  Will recheck INR in 1 1/2 weeks on 04/11/17 due to these changes.  Note: patient's right arm continues to show signs of healing with very light, healed bruising of a yellowish color to arm.  Overall arm is better but she is still having soreness and swelling over right elbow in area of bursitis.  Will set her up with Dr. Lorelei Pont for further eval if does not continue to heal.

## 2017-04-04 ENCOUNTER — Ambulatory Visit (INDEPENDENT_AMBULATORY_CARE_PROVIDER_SITE_OTHER): Payer: PPO

## 2017-04-04 DIAGNOSIS — Z959 Presence of cardiac and vascular implant and graft, unspecified: Secondary | ICD-10-CM

## 2017-04-04 DIAGNOSIS — I5032 Chronic diastolic (congestive) heart failure: Secondary | ICD-10-CM | POA: Diagnosis not present

## 2017-04-04 MED ORDER — METOLAZONE 2.5 MG PO TABS: ORAL_TABLET | ORAL | 0 refills | Status: DC

## 2017-04-04 NOTE — Progress Notes (Signed)
EPIC Encounter for ICM Monitoring  Patient Name: [name] is a 83 y.o. female Date: 04/04/2017 Primary Care Physican: Elby Beck, [place] Primary Cardiologist: Rockey Situ Electrophysiologist: Faustino Congress Weight:193lbs   Clinical Status (04-Mar-2017 to 04-Apr-2017) Treated VT/VF 0 episodes  AT/AF 458 episodes  Time in AT/AF 9.6 hr/day (40.1%)  Observations (1) (04-Mar-2017 to 04-Apr-2017)  AT/AF >= 6 hr for 13 days.      Heart Failure questions reviewed, pt symptomatic with shortness of breath when trying to sleep at night, when bending over, feels like she is going to pass out, weight gain 7 lbs in last week, sharp pain in chest area yesterday but none at this time.   Previous weight 186 lbs.     Thoracic impedance abnormal suggesting fluid accumulation since 03/27/2017.  Prescribed dosage: Furosemide 40 mg 1 tablet 3 times a day. Does not always take 3 tablets daily as prescribed. Patient self adjusts Furosemide as needed.   Labs: 03/04/2017 Creatinine 1.53, BUN 17, Potassium 4.5, Sodium 142, EGFR 32-37 11/07/2016 Creatinine 1.50, BUN 17, Potassium 4.0, Sodium 139, EGFR 35.45 06/03/2016 Creatinine 1.61, BUN 15, Potassium 4.6, Sodium 141, EGFR 30-35 10/26/2017Creatinine 1.63, BUN 19, Potassium 3.5, Sodium 139, EGFR 29-33  03/19/2016 Creatinine 1.59, BUN 20, Potassium 4.1, Sodium 141  02/12/2016 Creatinine 1.70, BUN 20, Potassium 3.8, Sodium 139  12/26/2015 Creatinine 1.54, BUN 17, Potassium 4.0, Sodium 139, EGFR 31-36  07/27/2015 Creatinine 1.30, BUN 14, Potassium 4.9, Sodium 143, EGFR 39-45  Recommendations:  Reviewed with Dr Rockey Situ in the office.  He recommended to take Furosemide 60 mg bid x 3-4 days, ordered 1 time dose of Metolazone 2.5 mg 1 tablet 30 minutes prior to the morning Furosemide dosage and Dr Donivan Scull nurse will call next week to schedule a stress test.  Advised that PCP check her urine for reoccurring UTI.  Explained he can switch from Furosemide to  Torsemide  if the Furosemide stops being effective for her.   Encouraged her to eat foods with Potassium such as bananas after she takes Metolazone since it has a side effect that can deplete potassium.  Advised she can take over the counter Potassium if needed as well.   She verbalized understanding of instructions.  Advised will order 3 tablets of Metolazone but she only to take 1 dose this weekend and not to take other doses unless directed by physician.  Advised to call back Monday if her symptoms have not improved.   If chest pain worsens over the weekend to use local ER.    Follow-up plan: ICM clinic phone appointment on 04/10/2017 to recheck fluid levels.    Copy of ICM check sent to Dr. Caryl Comes and Dr. Rockey Situ.   3 month ICM trend: 04/04/2017    1 Year ICM trend:       Rosalene Billings, RN 04/04/2017 9:25 AM

## 2017-04-05 ENCOUNTER — Telehealth: Payer: Self-pay | Admitting: Physician Assistant

## 2017-04-05 NOTE — Telephone Encounter (Signed)
I have attempted to return a page three times, phone call is forwarded to voicemail.

## 2017-04-05 NOTE — Telephone Encounter (Signed)
Called in coreg to [employer].

## 2017-04-07 ENCOUNTER — Encounter: Payer: Self-pay | Admitting: Family Medicine

## 2017-04-07 ENCOUNTER — Other Ambulatory Visit: Payer: Self-pay

## 2017-04-07 ENCOUNTER — Ambulatory Visit: Payer: PPO | Admitting: Family Medicine

## 2017-04-07 ENCOUNTER — Other Ambulatory Visit: Payer: Self-pay | Admitting: Family Medicine

## 2017-04-07 VITALS — BP 138/70 | HR 78 | Temp 97.7°F | Ht 66.0 in | Wt 188.2 lb

## 2017-04-07 DIAGNOSIS — M7021 Olecranon bursitis, right elbow: Secondary | ICD-10-CM

## 2017-04-07 NOTE — Progress Notes (Signed)
Dr. Frederico Hamman T. Viann Nielson, MD, [facility] [place], [address] Phone: [phone] Fax: [phone]  04/07/2017  Patient: [name], MRN: [id number], DOB: 1934/02/03, 83 y.o.  Primary Physician:  Elby Beck, FNP   Chief Complaint  Patient presents with  . Elbow Pain    x 3 weeks   Subjective:   [name] is a 83 y.o. very pleasant female patient who presents with the following:  2 1/2 weeks ago, and a lot of blood and bruising. Sometimes will hurt off and on.  She was seen about 2 weeks ago, and at that point was thought to possibly have some tennis elbow, and she had some significant bruising and swelling in her right elbow.  She has had some mild pain.  Since then her bruising and swelling in her forearm is almost completely gone, and she also had some swelling in her olecranon which is still present, but significantly improved compared to her last time in the office.  Past Medical History, Surgical History, Social History, Family History, Problem List, Medications, and Allergies have been reviewed and updated if relevant.  Patient Active Problem List   Diagnosis Date Noted  . Microcytic anemia 11/18/2016  . Dysuria 11/18/2016  . Pulmonary hypertension ([place]) 12/04/2015  . B12 deficiency 05/22/2015  . PVD (peripheral vascular disease) ([place])   . Elevated serum creatinine   . Renal cell carcinoma ([place])   . S/p nephrectomy   . TIA (transient ischemic attack) 08/16/2014  . Weakness 01/14/2014  . Chronic kidney disease (CKD), stage IV (severe) ([place]) 12/08/2013  . Encounter for long-term (current) use of antiplatelets/antithrombotics 06/07/2013  . Chronic diastolic CHF (congestive heart failure) ([place]) 06/04/2013  . Mitral valve regurgitation 06/04/2013  . Allergic rhinitis 01/01/2013  . Chronic cholecystitis with calculus 09/15/2012  . Claudication ([place]) 01/21/2012  . Dizziness 11/26/2011  .  Hypertension 05/20/2011  . Avascular necrosis of hip ([place]) 05/03/2011  . CARCINOMA, RENAL CELL 10/13/2009  . OTHER AND UNSPECIFIED COAGULATION DEFECTS 10/13/2009  . RECTAL BLEEDING 10/13/2009  . HOCM / IHSS 07/14/2009  . EDEMA 07/14/2009  . Hyperlipidemia 04/10/2009  . ADJUSTMENT DISORDER WITH ANXIETY 04/10/2009  . INCONTINENCE, URGE 04/10/2009  . Atrial fibrillation ([place]) 10/27/2008  . SINUS BRADYCARDIA 10/27/2008  . Carotid artery stenosis 10/27/2008  . COPD (chronic obstructive pulmonary disease) ([place]) 10/27/2008  . Implantable cardioverter-defibrillator (ICD) in situ 10/27/2008    Past Medical History:  Diagnosis Date  . Adjustment disorder with anxiety   . Anginal pain ([place])   . Arthritis    "some in my hands" (11/11/2012)  . Atrial fibrillation ([place])   . Automatic implantable cardioverter-defibrillator in situ   . Blood in stool   . Carotid artery stenosis 09/2007   60-79% bilateral (stable)  . Carotid artery stenosis 10/2008   40-59% R 60-79%   . Chest pain, unspecified   . Chronic airway obstruction, not elsewhere classified   . Chronic bronchitis ([place])    "get it some; not q year" (11/11/2012)  . Chronic diastolic CHF (congestive heart failure) ([place]) 06/04/2013  . Chronic kidney disease, unspecified   . Coronary artery disease    non-obstructive by 2006 cath  . Dysuria   . Edema   . Encounter for therapeutic drug monitoring   . Exertional shortness of breath   . Frequent UTI    "get them a couple times/yr" (11/11/2012)  . Hemorrhage of rectum and anus   .  High cholesterol   . History of blood transfusion 04/2011   "after hip OR" (11/11/2012)  . Hypertension 05/20/2011  . Hypertr obst cardiomyop   . Hypotension, unspecified    cardiac cath 2006..nonobstructive CAD 30-40s lesions.Marland KitchenETT 1/09 nondiagnostic due to poor HR response..Right Renal Cancer 2003  . Long term (current) use of anticoagulants   . Malignant neoplasm of kidney, except pelvis   . Osteoarthritis of  right hip   . Other acute sinusitis   . Other and unspecified coagulation defects   . Other malaise and fatigue   . Other screening mammogram   . PONV (postoperative nausea and vomiting)   . Pre-operative cardiovascular examination   . Renal cancer ([place]) 06/2001   Right  . Secondary cardiomyopathy, unspecified   . Sinus bradycardia   . Special screening for osteoporosis   . Urge incontinence     Past Surgical History:  Procedure Laterality Date  . ABDOMINAL HYSTERECTOMY  1975   for benign causes  . APPENDECTOMY    . BI-VENTRICULAR PACEMAKER UPGRADE  05/04/2010  . CARDIAC CATHETERIZATION  2006  . CATARACT EXTRACTION W/ INTRAOCULAR LENS  IMPLANT, BILATERAL  01/2006-02-2006  . CHOLECYSTECTOMY N/A 11/11/2012   Procedure: LAPAROSCOPIC CHOLECYSTECTOMY WITH INTRAOPERATIVE CHOLANGIOGRAM;  Surgeon: Imogene Burn. Georgette Dover, MD;  Location: [facility];  Service: General;  Laterality: N/A;  . EP IMPLANTABLE DEVICE N/A 02/21/2016   Procedure: ICD Generator Changeout;  Surgeon: Deboraha Sprang, MD;  Location: [facility];  Service: Cardiovascular;  Laterality: N/A;  . INSERT / REPLACE / REMOVE PACEMAKER  05-01-11   02-28-05-/05-04-10-ICD-MEDTRONIC MAXIMAL DR  . JOINT REPLACEMENT    . LAPAROSCOPIC CHOLECYSTECTOMY  11/11/2012  . LAPAROSCOPIC LYSIS OF ADHESIONS N/A 11/11/2012   Procedure: LAPAROSCOPIC LYSIS OF ADHESIONS;  Surgeon: Imogene Burn. Georgette Dover, MD;  Location: [facility];  Service: General;  Laterality: N/A;  . NEPHRECTOMY Right 06/2001    S/P RENAL CELL CANCER  . TOTAL HIP ARTHROPLASTY Right 05/03/2011   Procedure: TOTAL HIP ARTHROPLASTY ANTERIOR APPROACH;  Surgeon: Mcarthur Rossetti;  Location: [facility];  Service: Orthopedics;  Laterality: Right;  Removal of Cannulated Screws Right Hip, Right Direct Anterior Hip Replacement    Social History   Socioeconomic History  . Marital status: Widowed    Spouse name: Not on file  . Number of children: Not on file  . Years of education: Not on file  . Highest  education level: Not on file  Social Needs  . Financial resource strain: Not on file  . Food insecurity - worry: Not on file  . Food insecurity - inability: Not on file  . Transportation needs - medical: Not on file  . Transportation needs - non-medical: Not on file  Occupational History  . Occupation: Retired    Fish farm manager: RETIRED  Tobacco Use  . Smoking status: Former Smoker    Packs/day: 0.50    Years: 40.00    Pack years: 20.00    Types: Cigarettes    Last attempt to quit: 05/06/2001    Years since quitting: 15.9  . Smokeless tobacco: Never Used  Substance and Sexual Activity  . Alcohol use: No    Alcohol/week: 0.0 oz  . Drug use: No  . Sexual activity: No  Other Topics Concern  . 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   VERY ACTIVE IN HER CHURCH, HAS GROUP OF 4 BEST FRIENDS, SELF TITLED "THE GOLDEN  GIRLS".   ALCOHOL USE-NO   RETIRED   FORMER TOBACCO USE.....1/2 PACK X 40 YRS UNTIL 2003   Would desire CPR    Family History  Problem Relation Age of Onset  . Heart failure Mother   . Breast cancer Maternal Aunt 82  . Breast cancer Cousin   . Colon cancer Neg Hx     Allergies  Allergen Reactions  . Codeine Nausea And Vomiting  . Morphine And Related Nausea And Vomiting  . Sulfonamide Derivatives Other (See Comments)    REACTION: Dry mouth    Medication list reviewed and updated in full in [facility].  GEN: No fevers, chills. Nontoxic. Primarily MSK c/o today. MSK: Detailed in the HPI GI: tolerating PO intake without difficulty Neuro: No numbness, parasthesias, or tingling associated. Otherwise the pertinent positives of the ROS are noted above.   Objective:   BP 138/70   Pulse 78   Temp 97.7 F (36.5 C) (Oral)   Ht 5\' 6"  (1.676 m)   Wt 188 lb 4 oz (85.4 kg)   BMI 30.38 kg/m    GEN: WDWN, NAD, Non-toxic, Alert & Oriented x 3 HEENT: Atraumatic, Normocephalic.  Ears and  Nose: No external deformity. EXTR: No clubbing/cyanosis/edema NEURO: Normal gait.  PSYCH: Normally interactive. Conversant. Not depressed or anxious appearing.  Calm demeanor.   R elbow Ecchymosis or edema: neg ROM: full flexion, extension, pronation, supination Shoulder ROM: Full Flexion: 5/5 Extension: 5/5 Supination: 5/5  Pronation: 5/5 Wrist ext: 5/5 Wrist flexion: 5/5 No gross bony abnormality Varus and Valgus stress: stable ECRB tenderness: neg Medial epicondyle: NT Mild swelling at olecranon bursa Lateral epicondyle, resisted wrist extension from wrist full pronation and flexion: NT grip: 5/5  sensation intact Tinel's, Elbow: negative   Radiology: No results found.  Assessment and Plan:   Olecranon bursitis of right elbow  She is doing much better.  Completely resolved from the bruising.  She does have some partially resolved olecranon bursitis on the right.  This is aseptic, probable traumatic, and at this point it is not big enough to attempt draining it.  I gave her a compression dressing with an Ace bandage that she can use over the next week.  I would anticipate this will resolve over time.  Follow-up: No Follow-up on file.  Future Appointments  Date Time Provider [place]  04/08/2017  2:30 PM [facility]  04/10/2017  7:50 AM [facility] DEVICE REMOTES [facility]  04/11/2017 11:00 AM [facility] COUMADIN CLINIC [facility]  05/22/2017 10:30 AM [facility] DEVICE REMOTES [facility]   Signed,  Frederico Hamman T. Zephaniah Enyeart, MD   Allergies as of 04/07/2017      Reactions   Codeine Nausea And Vomiting   Morphine And Related Nausea And Vomiting   Sulfonamide Derivatives Other (See Comments)   REACTION: Dry mouth      Medication List        Accurate as of 04/07/17 12:35 PM. Always use your most recent med list.          acetaminophen 500 MG tablet Commonly known as:  TYLENOL Take 1,000 mg by mouth every 6  (six) hours as needed for headache.   carvedilol 12.5 MG tablet Commonly known as:  COREG TAKE 1 TABLET BY MOUTH TWICE A DAY WITH A MEAL   cetirizine 10 MG tablet Commonly known as:  ZYRTEC Take 10 mg by mouth daily.   disopyramide 150 MG capsule Commonly known as:  NORPACE TAKE 1 CAPSULE BY MOUTH  TWICE DAILY   estradiol 0.1 MG/GM vaginal cream Commonly known as:  ESTRACE Place 1 g 3 (three) times a week vaginally. Pea size about per urethra   fluticasone 50 MCG/ACT nasal spray Commonly known as:  FLONASE Place 2 sprays into both nostrils daily.   furosemide 40 MG tablet Commonly known as:  LASIX TAKE 1 TABLET BY MOUTH 3 TIMES DAILY   metolazone 2.5 MG tablet Commonly known as:  ZAROXOLYN Take 1 tablet (2.5mg  total) 30 minutes prior to morning dose of Furosemide. Do not take any further doses unless directed by physician.   rosuvastatin 40 MG tablet Commonly known as:  CRESTOR Take 1 tablet (40 mg total) by mouth daily.   sildenafil 20 MG tablet Commonly known as:  REVATIO Take 1 tablet (20 mg total) by mouth 3 (three) times daily.   trimethoprim 100 MG tablet Commonly known as:  TRIMPEX Take 1 tablet (100 mg total) daily by mouth.   warfarin 2 MG tablet Commonly known as:  COUMADIN Take as directed by the anticoagulation clinic. If you are unsure how to take this medication, talk to your nurse or doctor. Original instructions:  Take daily as directed by coumadin clinic

## 2017-04-08 ENCOUNTER — Telehealth: Payer: Self-pay | Admitting: *Deleted

## 2017-04-08 ENCOUNTER — Ambulatory Visit
Admission: RE | Admit: 2017-04-08 | Discharge: 2017-04-08 | Disposition: A | Discharge status: Home / Self Care | Payer: PPO | Source: Ambulatory Visit | Attending: Urology | Admitting: Urology

## 2017-04-08 DIAGNOSIS — Z87440 Personal history of urinary (tract) infections: Secondary | ICD-10-CM | POA: Diagnosis not present

## 2017-04-08 DIAGNOSIS — R0602 Shortness of breath: Secondary | ICD-10-CM

## 2017-04-08 DIAGNOSIS — C642 Malignant neoplasm of left kidney, except renal pelvis: Secondary | ICD-10-CM | POA: Diagnosis not present

## 2017-04-08 DIAGNOSIS — Z85528 Personal history of other malignant neoplasm of kidney: Secondary | ICD-10-CM | POA: Diagnosis not present

## 2017-04-08 DIAGNOSIS — Z905 Acquired absence of kidney: Secondary | ICD-10-CM | POA: Diagnosis not present

## 2017-04-08 NOTE — Telephone Encounter (Signed)
-----   Message from Rosalene Billings, RN sent at 04/04/2017  6:35 PM EST ----- Regarding: Stress test Hi Pam,   Can you call patient Monday and set up stress test.  Dr Rockey Situ ordered it but I am not sure how to put the order in.  If you show me Monday I can put it in.    Thanks! Margarita Grizzle

## 2017-04-08 NOTE — Telephone Encounter (Signed)
Now a pt of Neoma Laming Gessner/thx dmf

## 2017-04-08 NOTE — Telephone Encounter (Signed)
Spoke with patient and reviewed Dr. Donivan Scull recommendations for stress test and she was agreeable with this plan. Scheduled her for lexiscan 04/16/17 at 07:30AM at [facility]. Reviewed all instructions and information with patient and she verbalized understanding with no further questions at this time.

## 2017-04-09 ENCOUNTER — Telehealth: Payer: Self-pay

## 2017-04-09 NOTE — Telephone Encounter (Signed)
Will send a letter

## 2017-04-09 NOTE — Telephone Encounter (Signed)
-----   Message from Hollice Espy, MD sent at 04/08/2017  7:56 PM EST ----- Renal ultrasound is normal.  Right kidney is absent, left kidney without masses or lesions.  This is great news.

## 2017-04-10 ENCOUNTER — Telehealth: Payer: Self-pay | Admitting: Cardiology

## 2017-04-10 NOTE — Telephone Encounter (Signed)
Confirmed remote transmission w/ pt.   

## 2017-04-11 ENCOUNTER — Ambulatory Visit (INDEPENDENT_AMBULATORY_CARE_PROVIDER_SITE_OTHER): Payer: PPO

## 2017-04-11 ENCOUNTER — Ambulatory Visit: Payer: PPO

## 2017-04-11 ENCOUNTER — Other Ambulatory Visit: Payer: Self-pay | Admitting: Family Medicine

## 2017-04-11 DIAGNOSIS — Z7902 Long term (current) use of antithrombotics/antiplatelets: Secondary | ICD-10-CM | POA: Diagnosis not present

## 2017-04-11 LAB — POCT INR: INR: 2.3

## 2017-04-11 MED ORDER — WARFARIN SODIUM 2 MG PO TABS: ORAL_TABLET | ORAL | 0 refills | Status: DC

## 2017-04-11 NOTE — Patient Instructions (Signed)
NR today 2.3  Patient is to continue taking 2mg  daily EXCEPT for 3mg  on Sun, Tues and Saturday.  Recheck in 2 weeks.   *Patient has recently started low dose daily abx therap (trimethoprim) and cranberry tablets for UTI preventative from her urologist.  Will watch INR q 2 weeks for a month or so to ensure safety before returning to monthly checks.    Patient is overall doing well without any other changes to diet, health or medications and no further abnormal bruising or bleeding.  Her R arm is healing from recent bursitis and she is following under a specialist for eval if does not continue to improve.

## 2017-04-11 NOTE — Telephone Encounter (Signed)
Not sure if [place] is in the office right now but here is a refill request from 4 days ago/thx dmf

## 2017-04-11 NOTE — Telephone Encounter (Signed)
Order sent to patient's pharmacy.

## 2017-04-22 ENCOUNTER — Encounter
Admission: RE | Admit: 2017-04-22 | Discharge: 2017-04-22 | Disposition: A | Discharge status: Home / Self Care | Payer: PPO | Source: Ambulatory Visit | Attending: Cardiovascular Disease | Admitting: Cardiovascular Disease

## 2017-04-22 ENCOUNTER — Ambulatory Visit: Payer: PPO

## 2017-04-22 DIAGNOSIS — R0602 Shortness of breath: Secondary | ICD-10-CM | POA: Diagnosis not present

## 2017-04-22 LAB — NM MYOCAR MULTI W/SPECT W/WALL MOTION / EF
LV dias vol: 65 mL (ref 46–106)
LV sys vol: 23 mL
Peak HR: 64 {beats}/min
Percent HR: 46 %
Rest HR: 60 {beats}/min
TID: 1.07

## 2017-04-22 MED ORDER — REGADENOSON 0.4 MG/5ML IV SOLN
0.4000 mg | Freq: Once | INTRAVENOUS | Status: AC
  Administered 2017-04-22: 0.4 mg via INTRAVENOUS

## 2017-04-22 MED ORDER — TECHNETIUM TC 99M TETROFOSMIN IV KIT
10.0000 | PACK | Freq: Once | INTRAVENOUS | Status: AC | PRN
  Administered 2017-04-22: 13.46 via INTRAVENOUS

## 2017-04-22 MED ORDER — TECHNETIUM TC 99M TETROFOSMIN IV KIT
32.9300 | PACK | Freq: Once | INTRAVENOUS | Status: AC | PRN
  Administered 2017-04-22: 32.93 via INTRAVENOUS

## 2017-04-24 ENCOUNTER — Telehealth: Payer: Self-pay

## 2017-04-24 ENCOUNTER — Ambulatory Visit: Payer: PPO

## 2017-04-24 NOTE — Telephone Encounter (Signed)
Copied from [facility] [phone]. Topic: Quick Communication - Appointment Cancellation >> Apr 24, 2017  8:52 AM P[place]illa Handler wrote: Patient called to cancel coumadin clinic appointment scheduled for today. Patient has rescheduled their appointment.    Route to department's PEC pool.

## 2017-04-27 ENCOUNTER — Telehealth: Payer: Self-pay | Admitting: Urology

## 2017-04-27 MED ORDER — CEPHALEXIN 500 MG PO CAPS: 500.0000 mg | ORAL_CAPSULE | Freq: Two times a day (BID) | ORAL | 0 refills | Status: DC

## 2017-04-27 NOTE — Telephone Encounter (Signed)
Pt called answering service with dysuria and foul urine odor. I called her back to get more information but she did not answer on home or cell and no VM was set up. Based on her allergies, cx's, renal fxn and coumadin use I sent in cephalexin 500 mg, one po bid, #10, no refill.

## 2017-04-28 NOTE — Progress Notes (Signed)
No ICM remote transmission received for 04/10/2017 and next ICM transmission scheduled for 05/22/2017.

## 2017-04-30 ENCOUNTER — Telehealth: Payer: Self-pay | Admitting: General Practice

## 2017-04-30 ENCOUNTER — Ambulatory Visit: Payer: Self-pay

## 2017-04-30 NOTE — Telephone Encounter (Signed)
Pt.states she is on an antibiotic and wants to know if she still needs to come in tomorrow for her INR. Office will be back in touch with pt.

## 2017-04-30 NOTE — Telephone Encounter (Signed)
Tried to return call to patient about keeping appointment tomorrow, 12/27.  No answer on either number and unable to leave message.  If Patient does call back to the office, she does need to keep the appointment.

## 2017-05-01 ENCOUNTER — Ambulatory Visit: Payer: PPO

## 2017-05-02 ENCOUNTER — Other Ambulatory Visit: Payer: Self-pay | Admitting: Internal Medicine

## 2017-05-08 ENCOUNTER — Ambulatory Visit (INDEPENDENT_AMBULATORY_CARE_PROVIDER_SITE_OTHER): Payer: PPO | Admitting: General Practice

## 2017-05-08 DIAGNOSIS — Z7902 Long term (current) use of antithrombotics/antiplatelets: Secondary | ICD-10-CM

## 2017-05-08 DIAGNOSIS — Z7901 Long term (current) use of anticoagulants: Secondary | ICD-10-CM | POA: Insufficient documentation

## 2017-05-08 DIAGNOSIS — E538 Deficiency of other specified B group vitamins: Secondary | ICD-10-CM

## 2017-05-08 LAB — POCT INR: INR: 2.1

## 2017-05-08 MED ORDER — CYANOCOBALAMIN 1000 MCG/ML IJ SOLN
1000.0000 ug | Freq: Once | INTRAMUSCULAR | Status: AC
  Administered 2017-05-08: 1000 ug via INTRAMUSCULAR

## 2017-05-08 NOTE — Patient Instructions (Addendum)
Pre visit review using our clinic review tool, if applicable. No additional management support is needed unless otherwise documented below in the visit note.  Patient is to continue taking 2mg  daily EXCEPT for 3mg  on Sun, Tues and Saturday.  Recheck in 2 weeks.

## 2017-05-09 ENCOUNTER — Telehealth: Payer: Self-pay | Admitting: Cardiovascular Disease

## 2017-05-09 MED ORDER — FUROSEMIDE 40 MG PO TABS: 40.0000 mg | ORAL_TABLET | ORAL | 3 refills | Status: DC

## 2017-05-09 NOTE — Telephone Encounter (Signed)
Spoke with patient and she reports that per Dr. Rockey Situ she was to take extra fluid pill as needed for fluid retention, swelling, and shortness of breath. She states that she takes only when she has that extra fluid and that it is roughly 8-10 pills extra a month. Sent in new prescription with updated instructions to take 1 tablet three times daily and as needed for swelling. She was appreciative for the call back and assistance with medication. Reviewed sodium restriction, fluid restriction, and advised that she should continue monitoring. She verbalized understanding of our conversation and had no further questions or concerns at this time.

## 2017-05-09 NOTE — Progress Notes (Signed)
I have reviewed this visit and I agree on the patient's plan of dosage and recommendations. Lindzey Zent B Blessing Zaucha, FNP   

## 2017-05-09 NOTE — Progress Notes (Signed)
I have reviewed this visit and I agree on the patient's plan of dosage and recommendations. Deborah B Gessner, FNP   

## 2017-05-09 NOTE — Telephone Encounter (Signed)
Patient would like to know if her prescription for furosemide (LASIX) 40 MG can be increased At last visit she was told it was okay to take an extra pill if she felt it was necessary so she has been taking on average 10 to 12 pills more a month and is running out medication early Please call to discuss

## 2017-05-22 ENCOUNTER — Ambulatory Visit (INDEPENDENT_AMBULATORY_CARE_PROVIDER_SITE_OTHER): Payer: PPO | Admitting: *Deleted

## 2017-05-22 DIAGNOSIS — Z95810 Presence of automatic (implantable) cardiac defibrillator: Secondary | ICD-10-CM

## 2017-05-22 DIAGNOSIS — I5032 Chronic diastolic (congestive) heart failure: Secondary | ICD-10-CM | POA: Diagnosis not present

## 2017-05-22 DIAGNOSIS — I428 Other cardiomyopathies: Secondary | ICD-10-CM | POA: Diagnosis not present

## 2017-05-22 NOTE — Progress Notes (Signed)
Remote ICD transmission.   

## 2017-05-23 ENCOUNTER — Ambulatory Visit (INDEPENDENT_AMBULATORY_CARE_PROVIDER_SITE_OTHER): Payer: PPO

## 2017-05-23 ENCOUNTER — Encounter: Payer: Self-pay | Admitting: Cardiology

## 2017-05-23 VITALS — BP 150/73 | HR 74 | Ht 66.0 in | Wt 180.0 lb

## 2017-05-23 DIAGNOSIS — N390 Urinary tract infection, site not specified: Secondary | ICD-10-CM

## 2017-05-23 LAB — URINALYSIS, COMPLETE
Bilirubin, UA: NEGATIVE
Glucose, UA: NEGATIVE
Ketones, UA: NEGATIVE
Nitrite, UA: NEGATIVE
Protein, UA: NEGATIVE
RBC, UA: NEGATIVE
Specific Gravity, UA: 1.015 (ref 1.005–1.030)
Urobilinogen, Ur: 0.2 mg/dL (ref 0.2–1.0)
pH, UA: 7 (ref 5.0–7.5)

## 2017-05-23 LAB — MICROSCOPIC EXAMINATION
Bacteria, UA: NONE SEEN
Epithelial Cells (non renal): NONE SEEN /hpf (ref 0–10)

## 2017-05-23 NOTE — Addendum Note (Signed)
Addended by: Toniann Fail C on: 05/23/2017 12:00 PM   Modules accepted: Level of Service

## 2017-05-23 NOTE — Progress Notes (Addendum)
Pt presents today with c/o urinary frequency, urgency, dysuria, leakage of urine, back pain, lower abd pain, and chills. Pt was on keflex during christmas. Pt also takes a daily trimethoprim. A clean catch was obtained for u/a and cx.  Blood pressure (!) 150/73, pulse 74, height 5\' 6"  (1.676 m), weight 180 lb (81.6 kg).  Per Dr. Erlene Quan pt needs to have a cysto. Pt c/o itching with trimethoprim and Dr. Erlene Quan ordered d/c abx. Pt voiced understanding of whole conversation.

## 2017-05-23 NOTE — Progress Notes (Signed)
EPIC Encounter for ICM Monitoring  Patient Name: [name] is a 84 y.o. female Date: 05/23/2017 Primary Care Physican: Elby Beck, [place] Primary Cardiologist: Rockey Situ Electrophysiologist: Faustino Congress Weight:185lbs   Clinical Status (04-Apr-2017 to 22-May-2017) Treated VT/VF 0 episodes  AT/AF 524 episodes  Time in AT/AF 5.3 hr/day (22.1%)     Heart Failure questions reviewed, pt asymptomatic in the last 10 days.  She has significant fluid symptoms during decreased impedance and took extra Furosemide.   Thoracic impedance normal.  Prescribed dosage: Furosemide 40 mg take 1 tablet (40 mg total) by mouth as directed. Three times daily and as needed for swelling. Patient self adjusts Furosemide as needed.  Labs: 03/04/2017 Creatinine 1.53, BUN 17, Potassium 4.5, Sodium 142, EGFR 32-37 11/07/2016 Creatinine 1.50, BUN 17, Potassium 4.0, Sodium 139, EGFR 35.45 06/03/2016 Creatinine 1.61, BUN 15, Potassium 4.6, Sodium 141, EGFR 30-35 10/26/2017Creatinine 1.63, BUN 19, Potassium 3.5, Sodium 139, EGFR 29-33  03/19/2016 Creatinine 1.59, BUN 20, Potassium 4.1, Sodium 141  02/12/2016 Creatinine 1.70, BUN 20, Potassium 3.8, Sodium 139  12/26/2015 Creatinine 1.54, BUN 17, Potassium 4.0, Sodium 139, EGFR 31-36  07/27/2015 Creatinine 1.30, BUN 14, Potassium 4.9, Sodium 143, EGFR 39-45  Recommendations: No changes.  Encouraged to call for fluid symptoms.  Follow-up plan: ICM clinic phone appointment on 06/24/2017.    Copy of ICM check sent to Dr. Caryl Comes.   3 month ICM trend: 05/22/2017    AT/AF   1 Year ICM trend:       Rosalene Billings, RN 05/23/2017 1:07 PM

## 2017-05-28 ENCOUNTER — Telehealth: Payer: Self-pay

## 2017-05-28 LAB — CULTURE, URINE COMPREHENSIVE

## 2017-05-28 NOTE — Telephone Encounter (Signed)
-----   Message from Hollice Espy, MD sent at 05/28/2017  1:47 PM EST ----- Please let this patient know we check her urine for infection when she came into the office last week and no evidence of UTI  Hollice Espy, MD

## 2017-05-28 NOTE — Telephone Encounter (Signed)
Patient notified

## 2017-05-29 ENCOUNTER — Other Ambulatory Visit: Payer: Self-pay | Admitting: *Deleted

## 2017-05-29 DIAGNOSIS — I6523 Occlusion and stenosis of bilateral carotid arteries: Secondary | ICD-10-CM

## 2017-05-29 NOTE — Progress Notes (Signed)
vas 

## 2017-06-04 ENCOUNTER — Telehealth: Payer: Self-pay

## 2017-06-04 NOTE — Telephone Encounter (Signed)
This is a coumadin clinic appt. FYI to [employer].

## 2017-06-04 NOTE — Telephone Encounter (Signed)
Copied from [place] [phone]. Topic: General - Other >> Jun 04, 2017  2:17 PM [name] wrote: Pt cancelled appt on Thurs at 11:30 for shots.  She rescheduled for 06/12/17.

## 2017-06-05 ENCOUNTER — Ambulatory Visit: Payer: PPO

## 2017-06-05 LAB — CUP PACEART REMOTE DEVICE CHECK
Battery Remaining Longevity: 81 mo
Battery Voltage: 2.98 V
Brady Statistic AP VP Percent: 80.43 %
Brady Statistic AP VS Percent: 0.09 %
Brady Statistic AS VP Percent: 18.33 %
Brady Statistic AS VS Percent: 1.15 %
Brady Statistic RA Percent Paced: 76.89 %
Brady Statistic RV Percent Paced: 98.71 %
Date Time Interrogation Session: 20190117102603
HighPow Impedance: 36 Ohm
HighPow Impedance: 41 Ohm
Implantable Lead Implant Date: 20061026
Implantable Lead Implant Date: 20111230
Implantable Lead Location: 753859
Implantable Lead Location: 753860
Implantable Lead Model: 5076
Implantable Lead Model: 6947
Implantable Pulse Generator Implant Date: 20171018
Lead Channel Impedance Value: 342 Ohm
Lead Channel Impedance Value: 418 Ohm
Lead Channel Impedance Value: 456 Ohm
Lead Channel Pacing Threshold Amplitude: 0.625 V
Lead Channel Pacing Threshold Pulse Width: 0.4 ms
Lead Channel Sensing Intrinsic Amplitude: 0.5 mV
Lead Channel Sensing Intrinsic Amplitude: 0.5 mV
Lead Channel Sensing Intrinsic Amplitude: 9.75 mV
Lead Channel Sensing Intrinsic Amplitude: 9.75 mV
Lead Channel Setting Pacing Amplitude: 2.5 V
Lead Channel Setting Pacing Amplitude: 2.5 V
Lead Channel Setting Pacing Pulse Width: 0.4 ms
Lead Channel Setting Sensing Sensitivity: 0.3 mV

## 2017-06-05 NOTE — Telephone Encounter (Signed)
Noted. Thanks.

## 2017-06-10 ENCOUNTER — Ambulatory Visit: Payer: PPO | Admitting: Urology

## 2017-06-10 ENCOUNTER — Encounter: Payer: Self-pay | Admitting: Urology

## 2017-06-10 VITALS — BP 111/58 | HR 76 | Ht 66.0 in | Wt 183.0 lb

## 2017-06-10 DIAGNOSIS — N390 Urinary tract infection, site not specified: Secondary | ICD-10-CM

## 2017-06-10 LAB — URINALYSIS, COMPLETE
Bilirubin, UA: NEGATIVE
Glucose, UA: NEGATIVE
Ketones, UA: NEGATIVE
Leukocytes, UA: NEGATIVE
Nitrite, UA: NEGATIVE
Protein, UA: NEGATIVE
RBC, UA: NEGATIVE
Specific Gravity, UA: 1.01 (ref 1.005–1.030)
Urobilinogen, Ur: 0.2 mg/dL (ref 0.2–1.0)
pH, UA: 7 (ref 5.0–7.5)

## 2017-06-10 LAB — MICROSCOPIC EXAMINATION
Bacteria, UA: NONE SEEN
Epithelial Cells (non renal): NONE SEEN /hpf (ref 0–10)
WBC, UA: NONE SEEN /hpf (ref 0–?)

## 2017-06-10 MED ORDER — CIPROFLOXACIN HCL 500 MG PO TABS
500.0000 mg | ORAL_TABLET | Freq: Once | ORAL | Status: AC
  Administered 2017-06-10: 500 mg via ORAL

## 2017-06-10 MED ORDER — LIDOCAINE HCL 2 % EX GEL
1.0000 "application " | Freq: Once | CUTANEOUS | Status: AC
  Administered 2017-06-10: 1 via URETHRAL

## 2017-06-10 NOTE — Progress Notes (Signed)
   06/10/17  CC:  Chief Complaint  Patient presents with  . Cysto    HPI: 84 year old female with recurrent urinary tract infections who presents today for cystoscopy for further evaluation of her bladder.  She continues to have occasional burning with initiation of urination.  She has been unable to afford the topical estrogen cream.  She is doing all other interventions as previously discussed in the office.  She did have symptoms including urgency, frequency, dysuria, leakage, back pain, lower abdominal pain and chills on 05/23/2017.  Urine culture at the time was negative.  Blood pressure (!) 111/58, pulse 76, height 5\' 6"  (1.676 m), weight 183 lb (83 kg). NED. A&Ox3.   No respiratory distress   Abd soft, NT, ND Normal external genitalia with patent urethral meatus  Cystoscopy Procedure Note  Patient identification was confirmed, informed consent was obtained, and patient was prepped using Betadine solution.  Lidocaine jelly was administered per urethral meatus.    Preoperative abx where received prior to procedure.    Procedure: - Flexible cystoscope introduced, without any difficulty.   - Thorough search of the bladder revealed:    normal urethral meatus    normal urothelium    no stones    no ulcers     no tumors    no urethral polyps    no trabeculation  - Ureteral orifices were normal in position and appearance.  Post-Procedure: - Patient tolerated the procedure well  Assessment/ Plan:  1. Recurrent UTI Cystoscopy unremarkable today, no clear nidus for infection She is unable to afford topical estrogen cream, additional samples given today as this would likely be very beneficial Follow-up with any recurrent urinary symptoms No evidence of UTI today on UA - Urinalysis, Complete - ciprofloxacin (CIPRO) tablet 500 mg - lidocaine (XYLOCAINE) 2 % jelly 1 application   Hollice Espy, MD

## 2017-06-12 ENCOUNTER — Ambulatory Visit (INDEPENDENT_AMBULATORY_CARE_PROVIDER_SITE_OTHER): Payer: PPO | Admitting: General Practice

## 2017-06-12 DIAGNOSIS — Z7902 Long term (current) use of antithrombotics/antiplatelets: Secondary | ICD-10-CM

## 2017-06-12 DIAGNOSIS — E538 Deficiency of other specified B group vitamins: Secondary | ICD-10-CM | POA: Diagnosis not present

## 2017-06-12 DIAGNOSIS — Z7901 Long term (current) use of anticoagulants: Secondary | ICD-10-CM | POA: Diagnosis not present

## 2017-06-12 LAB — POCT INR: INR: 2

## 2017-06-12 MED ORDER — CYANOCOBALAMIN 1000 MCG/ML IJ SOLN
1000.0000 ug | Freq: Once | INTRAMUSCULAR | Status: AC
  Administered 2017-06-12: 1000 ug via INTRAMUSCULAR

## 2017-06-12 NOTE — Patient Instructions (Addendum)
Pre visit review using our clinic review tool, if applicable. No additional management support is needed unless otherwise documented below in the visit note.  Patient is to continue taking 2mg  daily EXCEPT for 3mg  on Sun, Tues and Saturday.  Recheck in 4 weeks.

## 2017-06-13 NOTE — Progress Notes (Signed)
I have reviewed this visit and I agree on the patient's plan of dosage and recommendations. Aella Ronda B Alayia Meggison, FNP   

## 2017-06-17 ENCOUNTER — Other Ambulatory Visit: Payer: Self-pay | Admitting: Urology

## 2017-06-24 ENCOUNTER — Ambulatory Visit (INDEPENDENT_AMBULATORY_CARE_PROVIDER_SITE_OTHER): Payer: PPO

## 2017-06-24 DIAGNOSIS — I5032 Chronic diastolic (congestive) heart failure: Secondary | ICD-10-CM

## 2017-06-24 DIAGNOSIS — Z95810 Presence of automatic (implantable) cardiac defibrillator: Secondary | ICD-10-CM

## 2017-06-24 NOTE — Progress Notes (Signed)
EPIC Encounter for ICM Monitoring  Patient Name: [name] is a 84 y.o. female Date: 06/24/2017 Primary Care Physican: Elby Beck, [place] Primary Cardiologist: Rockey Situ Electrophysiologist: Caryl Comes Dry Weight:185lbs  Clinical Status (22-May-2017 to 24-Jun-2017) Treated VT/VF 0 episodes  AT/AF 486 episodes  Time in AT/AF 7.3 hr/day (30.5%)  (has increased from 22% on 05/22/17) Longest AT/AF 3 hours Observations (1) (22-May-2017 to 24-Jun-2017)  AT/AF >= 6 hr for 11 days.       Heart Failure questions reviewed, pt feels more tired and shortness of breath especially when walking or going up steps.  She is also having sharpe pain that starts in left breast area that goes toward armpit several times a week for a couple minutes at at time for the last few weeks.    Thoracic impedance normal but was abnormal suggesting fluid accumulation.  Time in AT/AF has increased since last transmission.  Prescribed dosage: Furosemide 40 mg take 1 tablet (40 mg total) three times daily and as needed for swelling. Patient self adjusts Furosemide as needed.  Labs: 03/04/2017 Creatinine 1.53, BUN 17, Potassium 4.5, Sodium 142, EGFR 32-37 11/07/2016 Creatinine 1.50, BUN 17, Potassium 4.0, Sodium 139, EGFR 35.45 06/03/2016 Creatinine 1.61, BUN 15, Potassium 4.6, Sodium 141, EGFR 30-35 10/26/2017Creatinine 1.63, BUN 19, Potassium 3.5, Sodium 139, EGFR 29-33  03/19/2016 Creatinine 1.59, BUN 20, Potassium 4.1, Sodium 141  02/12/2016 Creatinine 1.70, BUN 20, Potassium 3.8, Sodium 139  12/26/2015 Creatinine 1.54, BUN 17, Potassium 4.0, Sodium 139, EGFR 31-36  07/27/2015 Creatinine 1.30, BUN 14, Potassium 4.9, Sodium 143, EGFR 39-45  Recommendations:  Encouraged to call for fluid symptoms.  Advised patient to go to ER if pain in breast area worsens or becomes urgent.   Follow-up plan: ICM clinic phone appointment on 07/25/2017.    Copy of ICM check sent to Dr. Caryl Comes.  Reviewed with Dr Rockey Situ  in the office and no recommendations at this time.   3 month ICM trend: 06/24/2017    AT/AF   1 Year ICM trend:       Rosalene Billings, RN 06/24/2017 1:19 PM

## 2017-07-07 ENCOUNTER — Ambulatory Visit (INDEPENDENT_AMBULATORY_CARE_PROVIDER_SITE_OTHER): Payer: PPO

## 2017-07-07 DIAGNOSIS — I6523 Occlusion and stenosis of bilateral carotid arteries: Secondary | ICD-10-CM | POA: Diagnosis not present

## 2017-07-10 ENCOUNTER — Ambulatory Visit (INDEPENDENT_AMBULATORY_CARE_PROVIDER_SITE_OTHER): Payer: PPO | Admitting: General Practice

## 2017-07-10 DIAGNOSIS — Z7901 Long term (current) use of anticoagulants: Secondary | ICD-10-CM | POA: Diagnosis not present

## 2017-07-10 DIAGNOSIS — Z7902 Long term (current) use of antithrombotics/antiplatelets: Secondary | ICD-10-CM | POA: Diagnosis not present

## 2017-07-10 DIAGNOSIS — I4891 Unspecified atrial fibrillation: Secondary | ICD-10-CM

## 2017-07-10 DIAGNOSIS — E538 Deficiency of other specified B group vitamins: Secondary | ICD-10-CM | POA: Diagnosis not present

## 2017-07-10 LAB — POCT INR: INR: 2.5

## 2017-07-10 MED ORDER — CYANOCOBALAMIN 1000 MCG/ML IJ SOLN
1000.0000 ug | Freq: Once | INTRAMUSCULAR | Status: AC
  Administered 2017-07-10: 1000 ug via INTRAMUSCULAR

## 2017-07-10 NOTE — Patient Instructions (Signed)
Pre visit review using our clinic review tool, if applicable. No additional management support is needed unless otherwise documented below in the visit note.  Patient is to continue taking 2mg  daily EXCEPT for 3mg  on Sun, Tues and Saturday.  Recheck in 4 weeks.

## 2017-07-14 ENCOUNTER — Ambulatory Visit (INDEPENDENT_AMBULATORY_CARE_PROVIDER_SITE_OTHER): Payer: PPO | Admitting: Family Medicine

## 2017-07-14 ENCOUNTER — Encounter: Payer: Self-pay | Admitting: Family Medicine

## 2017-07-14 VITALS — BP 134/74 | HR 72 | Temp 97.8°F | Wt 186.0 lb

## 2017-07-14 DIAGNOSIS — Z905 Acquired absence of kidney: Secondary | ICD-10-CM

## 2017-07-14 DIAGNOSIS — I771 Stricture of artery: Secondary | ICD-10-CM | POA: Insufficient documentation

## 2017-07-14 DIAGNOSIS — Z7901 Long term (current) use of anticoagulants: Secondary | ICD-10-CM | POA: Diagnosis not present

## 2017-07-14 DIAGNOSIS — N184 Chronic kidney disease, stage 4 (severe): Secondary | ICD-10-CM

## 2017-07-14 DIAGNOSIS — R300 Dysuria: Secondary | ICD-10-CM | POA: Diagnosis not present

## 2017-07-14 DIAGNOSIS — N390 Urinary tract infection, site not specified: Secondary | ICD-10-CM | POA: Diagnosis not present

## 2017-07-14 DIAGNOSIS — R0989 Other specified symptoms and signs involving the circulatory and respiratory systems: Secondary | ICD-10-CM | POA: Diagnosis not present

## 2017-07-14 LAB — POC URINALSYSI DIPSTICK (AUTOMATED)
Bilirubin, UA: NEGATIVE
Glucose, UA: NEGATIVE
Ketones, UA: NEGATIVE
Nitrite, UA: NEGATIVE
Protein, UA: NEGATIVE
Spec Grav, UA: 1.015 (ref 1.010–1.025)
Urobilinogen, UA: 0.2 E.U./dL
pH, UA: 6 (ref 5.0–8.0)

## 2017-07-14 NOTE — Assessment & Plan Note (Addendum)
H/o recurrent UTIs. Currently not on preventative medication. Micro without obvious infection at this time - will send UCx prior to treatment. Will contact patient with results.  Reviewed avoiding bladder irritants and increased water intake. Discussed ok to try azo for symptom relief.

## 2017-07-14 NOTE — Patient Instructions (Addendum)
We will refer you for abdominal aortic aneurysm screen. Urine culture sent off today - we will be in touch with results.  In the meantime, push plenty of fluids, avoid bladder irritants like caffeine, dark sodas, spicy food.

## 2017-07-14 NOTE — Assessment & Plan Note (Signed)
Newly heard in ex smoker - check AAA [place]

## 2017-07-14 NOTE — Progress Notes (Addendum)
BP 134/74 (BP Location: Left Arm, Patient Position: Sitting, Cuff Size: Normal)   Pulse 72   Temp 97.8 F (36.6 C) (Oral)   Wt 186 lb (84.4 kg)   SpO2 98%   BMI 30.02 kg/m    CC: UTI Subjective:    Patient ID: [name], female    DOB: [birth year], 84 y.o.   MRN: [id number]  HPI: [name] is a 83 y.o. female presenting on 07/14/2017 for Dysuria (Started 07/08/17. H/o UTI. [facility]. Says azythromycin. ); Hematuria; Back Pain (Located in lower left back. ); and Chills   5-6 d h/o recurrent dysuria, hematuria, frequency and urgency (on lasix), lower back and L flank pain, chills, nausea without vomiting, today symptoms worsened.  No fevers. Last UTI was several months ago (?03/2017).  She did not try azo recently.   As far as prior recurrent UTIs: Keflex works well. Augmentin did not work well. Has been treated with trimethoprim 100mg  daily preventatively - now off this due to itching.   H/o R nephrectomy for kidney cancer 2003.   H/o recurrent UTIs, saw urology s/p normal cystoscopy 06/2017 - notes reviewed. Thought atrophic vaginitis contributing. She feels she's had discomfort since cystoscopy.   On coumadin for atrial fibrillation. Known murmurs.   Relevant past medical, surgical, family and social history reviewed and updated as indicated. Interim medical history since our last visit reviewed. Allergies and medications reviewed and updated. Outpatient Medications Prior to Visit  Medication Sig Dispense Refill  . acetaminophen (TYLENOL) 500 MG tablet Take 1,000 mg by mouth every 6 (six) hours as needed for headache.    . carvedilol (COREG) 12.5 MG tablet TAKE 1 TABLET BY MOUTH TWICE A DAY WITH A MEAL 60 tablet 6  . cetirizine (ZYRTEC) 10 MG tablet Take 10 mg by mouth daily.     . disopyramide (NORPACE) 150 MG capsule TAKE 1 CAPSULE BY MOUTH TWICE DAILY 60 capsule 9  . estradiol (ESTRACE) 0.1 MG/GM vaginal cream Place 1 g 3 (three) times a week  vaginally. Pea size about per urethra 42.5 g 12  . fluticasone (FLONASE) 50 MCG/ACT nasal spray Place 2 sprays into both nostrils daily. 16 g 6  . furosemide (LASIX) 40 MG tablet Take 1 tablet (40 mg total) by mouth as directed. Three times daily and as needed for swelling. 120 tablet 3  . metolazone (ZAROXOLYN) 2.5 MG tablet Take 1 tablet (2.5mg  total) 30 minutes prior to morning dose of Furosemide. Do not take any further doses unless directed by physician. 3 tablet 0  . rosuvastatin (CRESTOR) 40 MG tablet Take 1 tablet (40 mg total) by mouth daily. 90 tablet 3  . sildenafil (REVATIO) 20 MG tablet Take 1 tablet (20 mg total) by mouth 3 (three) times daily. 90 tablet 6  . warfarin (COUMADIN) 2 MG tablet Take daily as directed by coumadin clinic 130 tablet 0  . trimethoprim (TRIMPEX) 100 MG tablet Take 1 tablet (100 mg total) daily by mouth. 90 tablet 0   Facility-Administered Medications Prior to Visit  Medication Dose Route Frequency Provider Last Rate Last Dose  . cyanocobalamin ((VITAMIN B-12)) injection 1,000 mcg  1,000 mcg Intramuscular Q30 days Lucille Passy, MD   1,000 mcg at 03/21/17 1729     Per HPI unless specifically indicated in ROS section below Review of Systems     Objective:    BP 134/74 (BP Location: Left Arm, Patient Position: Sitting, Cuff Size: Normal)   Pulse 72  Temp 97.8 F (36.6 C) (Oral)   Wt 186 lb (84.4 kg)   SpO2 98%   BMI 30.02 kg/m   Wt Readings from Last 3 Encounters:  07/14/17 186 lb (84.4 kg)  06/10/17 183 lb (83 kg)  05/23/17 180 lb (81.6 kg)    Physical Exam  Constitutional: She appears well-developed and well-nourished. No distress.  Cardiovascular:  Murmur heard. Abdominal: Soft. Normal appearance and bowel sounds are normal. She exhibits abdominal bruit. She exhibits no distension and no mass. There is no hepatosplenomegaly. There is no tenderness. There is no rigidity, no rebound, no guarding, no CVA tenderness and negative Murphy's sign.    Musculoskeletal: She exhibits no edema.  Psychiatric: She has a normal mood and affect.  Nursing note and vitals reviewed.  Results for orders placed or performed in visit on 07/14/17  POCT Urinalysis Dipstick (Automated)  Result Value Ref Range   Color, UA straw    Clarity, UA clear    Glucose, UA negative    Bilirubin, UA negative    Ketones, UA negative    Spec Grav, UA 1.015 1.010 - 1.025   Blood, UA trace    pH, UA 6.0 5.0 - 8.0   Protein, UA negative    Urobilinogen, UA 0.2 0.2 or 1.0 E.U./dL   Nitrite, UA negative    Leukocytes, UA Trace (A) Negative   Lab Results  Component Value Date   CREATININE 1.53 (H) 03/04/2017   BUN 17 03/04/2017   NA 142 03/04/2017   K 4.5 03/04/2017   CL 100 03/04/2017   CO2 20 03/04/2017       Assessment & Plan:   Problem List Items Addressed This Visit    Abdominal bruit    Newly heard in ex smoker - check AAA [place]       Relevant Orders   VAS [place] AAA DUPLEX   Chronic kidney disease (CKD), stage IV (severe) ([place])   Dysuria - Primary    H/o recurrent UTIs. Currently not on preventative medication. Micro without obvious infection at this time - will send UCx prior to treatment. Will contact patient with results.  Reviewed avoiding bladder irritants and increased water intake. Discussed ok to try azo for symptom relief.       Relevant Orders   POCT Urinalysis Dipstick (Automated) (Completed)   Urine Culture   Long term (current) use of anticoagulants   Recurrent UTI   S/p nephrectomy       No orders of the defined types were placed in this encounter.  Orders Placed This Encounter  Procedures  . Urine Culture  . POCT Urinalysis Dipstick (Automated)    Follow up plan: Return if symptoms worsen or fail to improve.  Ria Bush, MD

## 2017-07-15 LAB — URINE CULTURE
MICRO NUMBER:: 90307146
SPECIMEN QUALITY:: ADEQUATE

## 2017-07-25 ENCOUNTER — Ambulatory Visit (INDEPENDENT_AMBULATORY_CARE_PROVIDER_SITE_OTHER): Payer: PPO

## 2017-07-25 ENCOUNTER — Telehealth: Payer: Self-pay

## 2017-07-25 DIAGNOSIS — I5032 Chronic diastolic (congestive) heart failure: Secondary | ICD-10-CM | POA: Diagnosis not present

## 2017-07-25 DIAGNOSIS — Z95810 Presence of automatic (implantable) cardiac defibrillator: Secondary | ICD-10-CM | POA: Diagnosis not present

## 2017-07-25 NOTE — Telephone Encounter (Signed)
Remote ICM transmission received.  Attempted call to patient and answering machine.

## 2017-07-25 NOTE — Progress Notes (Signed)
EPIC Encounter for ICM Monitoring  Patient Name: [name] is a 84 y.o. female Date: 07/25/2017 Primary Care Physican: Elby Beck, [place] Primary Cardiologist: Rockey Situ Electrophysiologist: Caryl Comes Dry Weight:Previous weight 185lbs     Clinical Status (24-Jun-2017 to 25-Jul-2017) Treated VT/VF 0 episodes AT/AF 234 episodes  Time in AT/AF 8.0 hr/day (33.4%)  Observations (1) (24-Jun-2017 to 25-Jul-2017)  AT/AF >= 6 hr for 11 days.  Attempted call to patient and unable to reach.  Transmission reviewed.    Thoracic impedance normal.  Prescribed dosage: Furosemide 40 mg take 1 tablet (40 mg total) three times daily and as needed for swelling.Patient self adjusts Furosemide as needed.  Labs: 03/04/2017 Creatinine 1.53, BUN 17, Potassium 4.5, Sodium 142, EGFR 32-37 11/07/2016 Creatinine 1.50, BUN 17, Potassium 4.0, Sodium 139, EGFR 35.45 06/03/2016 Creatinine 1.61, BUN 15, Potassium 4.6, Sodium 141, EGFR 30-35 10/26/2017Creatinine 1.63, BUN 19, Potassium 3.5, Sodium 139, EGFR 29-33  03/19/2016 Creatinine 1.59, BUN 20, Potassium 4.1, Sodium 141  02/12/2016 Creatinine 1.70, BUN 20, Potassium 3.8, Sodium 139  12/26/2015 Creatinine 1.54, BUN 17, Potassium 4.0, Sodium 139, EGFR 31-36  07/27/2015 Creatinine 1.30, BUN 14, Potassium 4.9, Sodium 143, EGFR 39-45  Recommendations: NONE - Unable to reach.  Follow-up plan: ICM clinic phone appointment on 08/25/2017.  Office appointment scheduled 09/02/2017 with Dr. Caryl Comes.  Copy of ICM check sent to Dr. Caryl Comes.   3 month ICM trend: 07/25/2017    AT/AF   1 Year ICM trend:       Rosalene Billings, RN 07/25/2017 11:16 AM

## 2017-08-06 ENCOUNTER — Ambulatory Visit (INDEPENDENT_AMBULATORY_CARE_PROVIDER_SITE_OTHER): Payer: PPO

## 2017-08-06 DIAGNOSIS — R0989 Other specified symptoms and signs involving the circulatory and respiratory systems: Secondary | ICD-10-CM | POA: Diagnosis not present

## 2017-08-07 ENCOUNTER — Ambulatory Visit (INDEPENDENT_AMBULATORY_CARE_PROVIDER_SITE_OTHER): Payer: PPO | Admitting: General Practice

## 2017-08-07 DIAGNOSIS — E538 Deficiency of other specified B group vitamins: Secondary | ICD-10-CM | POA: Diagnosis not present

## 2017-08-07 DIAGNOSIS — Z7901 Long term (current) use of anticoagulants: Secondary | ICD-10-CM

## 2017-08-07 DIAGNOSIS — Z7902 Long term (current) use of antithrombotics/antiplatelets: Secondary | ICD-10-CM

## 2017-08-07 LAB — POCT INR: INR: 2.1

## 2017-08-07 MED ORDER — CYANOCOBALAMIN 1000 MCG/ML IJ SOLN
1000.0000 ug | Freq: Once | INTRAMUSCULAR | Status: AC
  Administered 2017-08-07: 1000 ug via INTRAMUSCULAR

## 2017-08-07 NOTE — Patient Instructions (Addendum)
Pre visit review using our clinic review tool, if applicable. No additional management support is needed unless otherwise documented below in the visit note.  Patient is to continue taking 2mg  daily EXCEPT for 3mg  on Sun, Tues and Saturday.  Recheck in 4 weeks.

## 2017-08-09 ENCOUNTER — Other Ambulatory Visit: Payer: Self-pay | Admitting: Family Medicine

## 2017-08-09 DIAGNOSIS — I739 Peripheral vascular disease, unspecified: Secondary | ICD-10-CM

## 2017-08-09 DIAGNOSIS — I771 Stricture of artery: Secondary | ICD-10-CM

## 2017-08-16 NOTE — Progress Notes (Signed)
Cardiology Office Note  Date:  08/18/2017   ID:  [name], DOB 08/29/1933, MRN [id number]  PCP:  [name] Beck, FNP   Chief Complaint  Patient presents with  . Other    6 month follow up. Patient c/o SOB, Chest pain, and swelling.  Meds reviewed verbally with patient.     HPI:  [name] is a 84 yo woman with a history of syncope,  symptomatic bradycardia, status post ICD.  nonobstructive coronary artery disease with chronic chest pain, by catheterization 2006 bilateral carotid artery stenosis 60-70% disease on the left, paroxysmal atrial fibrillation,  renal cell carcinoma status post resection of her right kidney with residual chronic renal insufficiency,  COPD, stopped smoking over 10 years ago. smoked for more than 30 years chronic leg pain single kidney, creatinine 1.4 She presents for routine follow-up of her carotid arterial disease, atrial fibrillation.  In follow-up she reports having continued problems with bladder pressure Worse at nighttime Reports having numerous testing done by urology Previous test for urinary tract infection was negative Still having lots of trouble, symptoms, discomfort, does not know what to do  Pacer download reviewed 22% atrial fib on pacer download Relatively asymptomatic Tolerating anticoagulation  Long discussion concerning lower extremity arterial Doppler study Greater than 50% iliac disease bilaterally Feels her legs are weak, has pain worse with exertion Feels she has claudication symptoms, would like to have this evaluated but wants to have her bladder taking care of first  In general she is sedentary, no regular exercise program  Typically Taking lasix 40 BID Sometimes misses doses, will eat out with friends  Previous carotid ultrasound results reviewed with her in detail 07/2017 Right Carotid: Velocities in the right ICA are consistent with a 1-39% stenosis.  Left Carotid: Velocities in the left ICA are  consistent with a 40-59% stenosis.  Optivol typically runs low  Typically takes Lasix for leg swelling Leg swelling previously felt secondary to venous insufficiency.  Does not wear compression hose  EKG personally reviewed by myself on todays visit Shows paced rhythm rate 64 bpm suspect underlying atrial fibrillation  Other past medical history reviewed previous episode of chest pain, chronic shortness of breath Echocardiogram 2016 and most recently 2017 with elevated right heart pressures, 70 mmHg   periodic symptoms of chest tightness, shortness of breath  Echocardiogram results reviewed with her in detail, elevated right heart pressures, normal ejection fraction  Carotid ultrasound reviewed with her showing stable moderate to severe bilateral carotid disease Other past medical history  She did try flecainide in the past and she had significant side effects as well as breakthrough arrhythmia.  right hip replacement on May 03 2011.  Chronic dizziness since the summer of 2014 . Symptoms were worse in December 2014  Echocardiogram showed normal ejection fraction, moderate mitral valve regurgitation, moderate TR, moderately elevated right ventricular systolic pressures.  stress test in March 2011.   Last carotid u/s 40-59% R 60-79%. No neuro symptoms.   Cardiac cath 2006 --non-obstructive CAD 30-40s lesions She reports having repeat catheterization since that time with no significant progression of her disease   PMH:   has a past medical history of Adjustment disorder with anxiety, Anginal pain ([place]), Arthritis, Atrial fibrillation ([place]), Automatic implantable cardioverter-defibrillator in situ, Blood in stool, Carotid artery stenosis (09/2007), Carotid artery stenosis (10/2008), Chest pain, unspecified, Chronic airway obstruction, not elsewhere classified, Chronic bronchitis ([place]), Chronic diastolic CHF (congestive heart failure) ([place]) (06/04/2013), Chronic kidney  disease, unspecified, Coronary artery  disease, Dysuria, Edema, Encounter for therapeutic drug monitoring, Exertional shortness of breath, Frequent UTI, Hemorrhage of rectum and anus, High cholesterol, History of blood transfusion (04/2011), Hypertension (05/20/2011), Hypertr obst cardiomyop, Hypotension, unspecified, Long term (current) use of anticoagulants, Malignant neoplasm of kidney, except pelvis, Osteoarthritis of right hip, Other acute sinusitis, Other and unspecified coagulation defects, Other malaise and fatigue, Other screening mammogram, PONV (postoperative nausea and vomiting), Pre-operative cardiovascular examination, Renal cancer ([place]) (06/2001), Secondary cardiomyopathy, unspecified, Sinus bradycardia, Special screening for osteoporosis, and Urge incontinence.  PSH:    Past Surgical History:  Procedure Laterality Date  . ABDOMINAL HYSTERECTOMY  1975   for benign causes  . APPENDECTOMY    . BI-VENTRICULAR PACEMAKER UPGRADE  05/04/2010  . CARDIAC CATHETERIZATION  2006  . CATARACT EXTRACTION W/ INTRAOCULAR LENS  IMPLANT, BILATERAL  01/2006-02-2006  . CHOLECYSTECTOMY N/A 11/11/2012   Procedure: LAPAROSCOPIC CHOLECYSTECTOMY WITH INTRAOPERATIVE CHOLANGIOGRAM;  Surgeon: Imogene Burn. Georgette Dover, MD;  Location: [facility];  Service: General;  Laterality: N/A;  . EP IMPLANTABLE DEVICE N/A 02/21/2016   Procedure: ICD Generator Changeout;  Surgeon: Deboraha Sprang, MD;  Location: [facility];  Service: Cardiovascular;  Laterality: N/A;  . INSERT / REPLACE / REMOVE PACEMAKER  05-01-11   02-28-05-/05-04-10-ICD-MEDTRONIC MAXIMAL DR  . JOINT REPLACEMENT    . LAPAROSCOPIC CHOLECYSTECTOMY  11/11/2012  . LAPAROSCOPIC LYSIS OF ADHESIONS N/A 11/11/2012   Procedure: LAPAROSCOPIC LYSIS OF ADHESIONS;  Surgeon: Imogene Burn. Georgette Dover, MD;  Location: [place];  Service: General;  Laterality: N/A;  . NEPHRECTOMY Right 06/2001    S/P RENAL CELL CANCER  . TOTAL HIP ARTHROPLASTY Right 05/03/2011   Procedure: TOTAL HIP ARTHROPLASTY  ANTERIOR APPROACH;  Surgeon: Mcarthur Rossetti;  Location: [facility];  Service: Orthopedics;  Laterality: Right;  Removal of Cannulated Screws Right Hip, Right Direct Anterior Hip Replacement    Current Outpatient Medications  Medication Sig Dispense Refill  . acetaminophen (TYLENOL) 500 MG tablet Take 1,000 mg by mouth every 6 (six) hours as needed for headache.    . carvedilol (COREG) 12.5 MG tablet TAKE 1 TABLET BY MOUTH TWICE A DAY WITH A MEAL 60 tablet 6  . cetirizine (ZYRTEC) 10 MG tablet Take 10 mg by mouth daily.     . disopyramide (NORPACE) 150 MG capsule TAKE 1 CAPSULE BY MOUTH TWICE DAILY 60 capsule 9  . estradiol (ESTRACE) 0.1 MG/GM vaginal cream Place 1 g 3 (three) times a week vaginally. Pea size about per urethra 42.5 g 12  . furosemide (LASIX) 40 MG tablet Take 1 tablet (40 mg total) by mouth as directed. Three times daily and as needed for swelling. 120 tablet 3  . hydrochlorothiazide (HYDRODIURIL) 25 MG tablet Take 25 mg by mouth daily.    . metolazone (ZAROXOLYN) 2.5 MG tablet Take 1 tablet (2.5mg  total) 30 minutes prior to morning dose of Furosemide. Do not take any further doses unless directed by physician. 3 tablet 0  . rosuvastatin (CRESTOR) 40 MG tablet Take 1 tablet (40 mg total) by mouth daily. 90 tablet 3  . sildenafil (REVATIO) 20 MG tablet Take 1 tablet (20 mg total) by mouth 3 (three) times daily. 90 tablet 6  . warfarin (COUMADIN) 2 MG tablet Take daily as directed by coumadin clinic 130 tablet 0   No current facility-administered medications for this visit.      Allergies:   Codeine; Morphine and related; and Sulfonamide derivatives   Social History:  The patient  reports that she quit smoking about 16 years  ago. Her smoking use included cigarettes. She has a 20.00 pack-year smoking history. She has never used smokeless tobacco. She reports that she does not drink alcohol or use drugs.   Family History:   family history includes Breast cancer in her cousin;  Breast cancer (age of onset: 1) in her maternal aunt; Heart failure in her mother.    Review of Systems: Review of Systems  Constitutional: Positive for malaise/fatigue.  Respiratory: Negative.   Cardiovascular: Negative.   Gastrointestinal: Negative.   Musculoskeletal: Negative.   Neurological: Negative.   Psychiatric/Behavioral: Negative.   All other systems reviewed and are negative.    PHYSICAL EXAM: VS:  BP 130/60 (BP Location: Left Arm, Patient Position: Sitting, Cuff Size: Normal)   Pulse 64   Ht 5' 6.25" (1.683 m)   Wt 191 lb 8 oz (86.9 kg)   BMI 30.68 kg/m  , BMI Body mass index is 30.68 kg/m. Constitutional:  oriented to person, place, and time. No distress.  HENT:  Head: Normocephalic and atraumatic.  Eyes:  no discharge. No scleral icterus.  Neck: Normal range of motion. Neck supple. No JVD present.  Cardiovascular: Normal rate, regular rhythm, normal heart sounds and intact distal pulses. Exam reveals no gallop and no friction rub. No edema No murmur heard. Pulmonary/Chest: Effort normal and breath sounds normal. No stridor. No respiratory distress.  no wheezes.  no rales.  no tenderness.  Abdominal: Soft.  no distension.  no tenderness.  Musculoskeletal: Normal range of motion.  no  tenderness or deformity.  Neurological:  normal muscle tone. Coordination normal. No atrophy Skin: Skin is warm and dry. No rash noted. not diaphoretic.  Psychiatric:  normal mood and affect. behavior is normal. Thought content normal.    Recent Labs: 11/07/2016: TSH 2.88 03/04/2017: BUN 17; Creatinine, Ser 1.53; Potassium 4.5; Sodium 142 03/06/2017: Hemoglobin 9.8; Platelets 215    Lipid Panel Lab Results  Component Value Date   CHOL 179 03/19/2016   HDL 39.10 03/19/2016   LDLCALC 118 (H) 03/19/2016   TRIG 110.0 03/19/2016      Wt Readings from Last 3 Encounters:  08/18/17 191 lb 8 oz (86.9 kg)  07/14/17 186 lb (84.4 kg)  06/10/17 183 lb (83 kg)      ASSESSMENT  AND PLAN:  Dysuria Chronic issues on many of her visits Suggested she read about chronic interstitial cystitis and talk with urology Uncertain if this is her diagnosis or other etiology Continues to have discomfort  Cardiac device in situ Previous device change out by Dr. Caryl Comes  periodic atrial fibrillation/tachycardia, 20% burden On warfarin Relatively asymptomatic Long discussion that we will probably leave her medications as they are  Pure hypercholesterolemia Tolerating Crestor 40 mg daily, previous numbers not at goal Recommend she start Zetia daily with repeat lipid panel in 3 months time Order has been placed Goal LDL less than 70  Atrial fibrillation, unspecified type ([place]) On anticoagulation, no complications from bleeding Relatively asymptomatic  Bilateral carotid artery stenosis Stable 50 to 60% disease on the left Has been stable  Essential hypertension Blood pressure is well controlled on today's visit. No changes made to the medications.  stable  Chronic diastolic CHF (congestive heart failure) ([facility]) In the past has used aggressive diuresis for leg swelling Overdiuresis leading to renal dysfunction  Pulmonary hypertension ([facility]) Severe underlying lung disease from 30+ years of smoking Currently on diuretics Reports breathing has been stable  Long discussion concerning various issues detailed above  Total encounter time more  than 45 minutes  Greater than 50% was spent in counseling and coordination of care with the patient  Disposition:   F/U  12 months  Signed, Esmond Plants, M.D., Ph.D. 08/18/2017  [place], [place]

## 2017-08-18 ENCOUNTER — Ambulatory Visit (INDEPENDENT_AMBULATORY_CARE_PROVIDER_SITE_OTHER): Payer: PPO | Admitting: Cardiovascular Disease

## 2017-08-18 ENCOUNTER — Encounter: Payer: Self-pay | Admitting: Cardiovascular Disease

## 2017-08-18 VITALS — BP 130/60 | HR 64 | Ht 66.25 in | Wt 191.5 lb

## 2017-08-18 DIAGNOSIS — E782 Mixed hyperlipidemia: Secondary | ICD-10-CM

## 2017-08-18 DIAGNOSIS — I428 Other cardiomyopathies: Secondary | ICD-10-CM | POA: Diagnosis not present

## 2017-08-18 DIAGNOSIS — I6523 Occlusion and stenosis of bilateral carotid arteries: Secondary | ICD-10-CM | POA: Diagnosis not present

## 2017-08-18 DIAGNOSIS — Z959 Presence of cardiac and vascular implant and graft, unspecified: Secondary | ICD-10-CM | POA: Diagnosis not present

## 2017-08-18 DIAGNOSIS — I272 Pulmonary hypertension, unspecified: Secondary | ICD-10-CM | POA: Diagnosis not present

## 2017-08-18 DIAGNOSIS — I480 Paroxysmal atrial fibrillation: Secondary | ICD-10-CM | POA: Diagnosis not present

## 2017-08-18 DIAGNOSIS — I5032 Chronic diastolic (congestive) heart failure: Secondary | ICD-10-CM | POA: Diagnosis not present

## 2017-08-18 DIAGNOSIS — J432 Centrilobular emphysema: Secondary | ICD-10-CM | POA: Diagnosis not present

## 2017-08-18 DIAGNOSIS — R0602 Shortness of breath: Secondary | ICD-10-CM

## 2017-08-18 MED ORDER — EZETIMIBE 10 MG PO TABS: 10.0000 mg | ORAL_TABLET | Freq: Every day | ORAL | 11 refills | Status: DC

## 2017-08-18 NOTE — Patient Instructions (Addendum)
We will push out your appt with Dr. Fletcher Anon to June for iliac artery stenosis, claudication  Medication Instructions:   Please start zetia 10 mg one tablet a day Stay on crestor  Labwork:  Your physician recommends that you return for a FASTING lipid profile/ liver: 3 months   Testing/Procedures:  No further testing at this time   Follow-Up: It was a pleasure seeing you in the office today. Please call us if you have new issues that need to be addressed before your next appt.  [phone]  Your physician wants you to follow-up in: 12 months.  You will receive a reminder letter in the mail two months in advance. If you don't receive a letter, please call our office to schedule the follow-up appointment.  If you need a refill on your cardiac medications before your next appointment, please call your pharmacy.  For educational health videos Log in to : [email or web address] Or : [email or web address], password : [email or web address]

## 2017-08-21 ENCOUNTER — Other Ambulatory Visit: Payer: Self-pay | Admitting: Cardiovascular Disease

## 2017-08-25 ENCOUNTER — Ambulatory Visit (INDEPENDENT_AMBULATORY_CARE_PROVIDER_SITE_OTHER): Payer: PPO | Admitting: *Deleted

## 2017-08-25 ENCOUNTER — Telehealth: Payer: Self-pay

## 2017-08-25 DIAGNOSIS — I5032 Chronic diastolic (congestive) heart failure: Secondary | ICD-10-CM

## 2017-08-25 DIAGNOSIS — I428 Other cardiomyopathies: Secondary | ICD-10-CM

## 2017-08-25 DIAGNOSIS — Z959 Presence of cardiac and vascular implant and graft, unspecified: Secondary | ICD-10-CM

## 2017-08-25 NOTE — Progress Notes (Signed)
EPIC Encounter for ICM Monitoring  Patient Name: [name] is a 84 y.o. female Date: 08/25/2017 Primary Care Physican: Elby Beck, [place] Primary Cardiologist: Rockey Situ Electrophysiologist: Caryl Comes Dry Weight: 191lbs(office weight 08/18/17)  Clinical Status (25-Jul-2017 to 25-Aug-2017) Treated VT/VF 0 episodes  AT/AF 92 episodes A Time in AT/AF 24.0 hr/day (99.9%)  Observations (1) (25-Jul-2017 to 25-Aug-2017)  AT/AF >= 6 hr for 31 days.       Attempted call to patient and unable to reach.  Transmission reviewed.    Thoracic impedance close to normal.  Prescribed dosage: Furosemide 40 mg take 1 tablet (40 mg total)three times daily and as needed for swelling.Patient self adjusts Furosemide as needed.   Labs: 03/04/2017 Creatinine 1.53, BUN 17, Potassium 4.5, Sodium 142, EGFR 32-37 11/07/2016 Creatinine 1.50, BUN 17, Potassium 4.0, Sodium 139, EGFR 35.45 06/03/2016 Creatinine 1.61, BUN 15, Potassium 4.6, Sodium 141, EGFR 30-35 10/26/2017Creatinine 1.63, BUN 19, Potassium 3.5, Sodium 139, EGFR 29-33  03/19/2016 Creatinine 1.59, BUN 20, Potassium 4.1, Sodium 141  02/12/2016 Creatinine 1.70, BUN 20, Potassium 3.8, Sodium 139  12/26/2015 Creatinine 1.54, BUN 17, Potassium 4.0, Sodium 139, EGFR 31-36  07/27/2015 Creatinine 1.30, BUN 14, Potassium 4.9, Sodium 143, EGFR 39-45   Recommendations: NONE - Unable to reach.  Follow-up plan: ICM clinic phone appointment on 10/06/2017.  Office appointment scheduled 09/02/2017 with Dr. Caryl Comes.  Copy of ICM check sent to Dr. Caryl Comes.   3 month ICM trend: 08/25/2017    AT/AF   1 Year ICM trend:       Rosalene Billings, RN 08/25/2017 11:31 AM

## 2017-08-25 NOTE — Progress Notes (Signed)
Remote ICD transmission.   

## 2017-08-25 NOTE — Telephone Encounter (Signed)
Remote ICM transmission received.  Attempted call to patient on home and cell number.  Home number busy and cell number did not have voice mail set up.

## 2017-08-26 ENCOUNTER — Ambulatory Visit (INDEPENDENT_AMBULATORY_CARE_PROVIDER_SITE_OTHER): Payer: PPO | Admitting: Family Medicine

## 2017-08-26 ENCOUNTER — Encounter: Payer: Self-pay | Admitting: Cardiology

## 2017-08-26 ENCOUNTER — Encounter: Payer: Self-pay | Admitting: Family Medicine

## 2017-08-26 VITALS — BP 124/70 | HR 70 | Temp 98.5°F | Ht 66.25 in | Wt 190.5 lb

## 2017-08-26 DIAGNOSIS — N898 Other specified noninflammatory disorders of vagina: Secondary | ICD-10-CM

## 2017-08-26 DIAGNOSIS — R300 Dysuria: Secondary | ICD-10-CM | POA: Diagnosis not present

## 2017-08-26 LAB — CUP PACEART REMOTE DEVICE CHECK
Battery Remaining Longevity: 80 mo
Battery Voltage: 2.98 V
Brady Statistic AP VP Percent: 4.77 %
Brady Statistic AP VS Percent: 0.12 %
Brady Statistic AS VP Percent: 85.58 %
Brady Statistic AS VS Percent: 9.52 %
Brady Statistic RA Percent Paced: 4.28 %
Brady Statistic RV Percent Paced: 90.48 %
Date Time Interrogation Session: 20190422052404
HighPow Impedance: 41 Ohm
HighPow Impedance: 50 Ohm
Implantable Lead Implant Date: 20061026
Implantable Lead Implant Date: 20111230
Implantable Lead Location: 753859
Implantable Lead Location: 753860
Implantable Lead Model: 5076
Implantable Lead Model: 6947
Implantable Pulse Generator Implant Date: 20171018
Lead Channel Impedance Value: 361 Ohm
Lead Channel Impedance Value: 456 Ohm
Lead Channel Impedance Value: 456 Ohm
Lead Channel Pacing Threshold Amplitude: 0.75 V
Lead Channel Pacing Threshold Pulse Width: 0.4 ms
Lead Channel Sensing Intrinsic Amplitude: 1 mV
Lead Channel Sensing Intrinsic Amplitude: 1 mV
Lead Channel Sensing Intrinsic Amplitude: 11.25 mV
Lead Channel Sensing Intrinsic Amplitude: 11.25 mV
Lead Channel Setting Pacing Amplitude: 2.5 V
Lead Channel Setting Pacing Amplitude: 2.5 V
Lead Channel Setting Pacing Pulse Width: 0.4 ms
Lead Channel Setting Sensing Sensitivity: 0.3 mV

## 2017-08-26 LAB — POC URINALSYSI DIPSTICK (AUTOMATED)
Bilirubin, UA: NEGATIVE
Glucose, UA: NEGATIVE
Ketones, UA: NEGATIVE
Nitrite, UA: NEGATIVE
Protein, UA: NEGATIVE
Spec Grav, UA: 1.015 (ref 1.010–1.025)
Urobilinogen, UA: 0.2 E.U./dL
pH, UA: 6 (ref 5.0–8.0)

## 2017-08-26 NOTE — Assessment & Plan Note (Addendum)
Recurrent UTI symptoms however has had prior negative UCx when symptoms were present. UA/micro today more consistent with contamination than infection. UCx sent. Possible interstitial cystitis diagnosis given longevity of symptoms. Will await UCx results then discuss treatment options including return to urology. NSAIDs limited by coumadin use.

## 2017-08-26 NOTE — Patient Instructions (Addendum)
We will be in touch with urine culture and plan.  Wet prep sent today.

## 2017-08-26 NOTE — Progress Notes (Signed)
BP 124/70 (BP Location: Left Arm, Patient Position: Sitting, Cuff Size: Normal)   Pulse 70   Temp 98.5 F (36.9 C) (Oral)   Ht 5' 6.25" (1.683 m)   Wt 190 lb 8 oz (86.4 kg)   SpO2 97%   BMI 30.52 kg/m    CC: "I swear I have a UTI" Subjective:    Patient ID: [name], female    DOB: 1933/07/16, 84 y.o.   MRN: [id number]  HPI: [name] is a 84 y.o. female presenting on 08/26/2017 for Dysuria (Has pain and burn during urination. Started 3-4 mos ago.  H/o UTI); Hematuria (Has seen a trace of blood in urine. ); and Back Pain (C/o low back on left side. )   Several month h/o dysuria, hematuria, back pain and suprapubic pain/pressure. Vaginal burning wakes her up at night. Small stream, incomplete emptying. Latest eval in our office 07/14/2017 with UCx growing MBM. Endorses some yellow vaginal discharge. Denies vaginal or vulvar rash. No itching.   H/o recurrent UTIs, but not recently. Saw urology s/p normal cystoscopy 06/2017 - notes reviewed. Thought atrophic vaginitis contributing - treated with estrace cream. She feels she's had pelvic discomfort since cystoscopy. Prior on trimethoprim 100mg  daily preventatively - now off this due to itching. Keflex has worked well in the past.  H/o R nephrectomy for kidney cancer 2003.  On coumadin for atrial fibrillation. Recent records reviewed.   Relevant past medical, surgical, family and social history reviewed and updated as indicated. Interim medical history since our last visit reviewed. Allergies and medications reviewed and updated. Outpatient Medications Prior to Visit  Medication Sig Dispense Refill  . acetaminophen (TYLENOL) 500 MG tablet Take 1,000 mg by mouth every 6 (six) hours as needed for headache.    . carvedilol (COREG) 12.5 MG tablet TAKE 1 TABLET BY MOUTH TWICE (2) DAILY WITH A MEAL 180 tablet 3  . cetirizine (ZYRTEC) 10 MG tablet Take 10 mg by mouth daily.     . disopyramide (NORPACE) 150 MG capsule TAKE 1  CAPSULE BY MOUTH TWICE DAILY 60 capsule 9  . estradiol (ESTRACE) 0.1 MG/GM vaginal cream Place 1 g 3 (three) times a week vaginally. Pea size about per urethra 42.5 g 12  . ezetimibe (ZETIA) 10 MG tablet Take 1 tablet (10 mg total) by mouth daily. 30 tablet 11  . furosemide (LASIX) 40 MG tablet Take 1 tablet (40 mg total) by mouth as directed. Three times daily and as needed for swelling. 120 tablet 3  . metolazone (ZAROXOLYN) 2.5 MG tablet Take 1 tablet (2.5mg  total) 30 minutes prior to morning dose of Furosemide. Do not take any further doses unless directed by physician. 3 tablet 0  . rosuvastatin (CRESTOR) 40 MG tablet Take 1 tablet (40 mg total) by mouth daily. 90 tablet 3  . sildenafil (REVATIO) 20 MG tablet Take 1 tablet (20 mg total) by mouth 3 (three) times daily. 90 tablet 6  . warfarin (COUMADIN) 2 MG tablet Take daily as directed by coumadin clinic 130 tablet 0  . hydrochlorothiazide (HYDRODIURIL) 25 MG tablet Take 25 mg by mouth daily.     No facility-administered medications prior to visit.      Per HPI unless specifically indicated in ROS section below Review of Systems     Objective:    BP 124/70 (BP Location: Left Arm, Patient Position: Sitting, Cuff Size: Normal)   Pulse 70   Temp 98.5 F (36.9 C) (Oral)   Ht 5'  6.25" (1.683 m)   Wt 190 lb 8 oz (86.4 kg)   SpO2 97%   BMI 30.52 kg/m   Wt Readings from Last 3 Encounters:  08/26/17 190 lb 8 oz (86.4 kg)  08/18/17 191 lb 8 oz (86.9 kg)  07/14/17 186 lb (84.4 kg)    Physical Exam  Constitutional: She appears well-developed and well-nourished. No distress.  Abdominal: Soft. Bowel sounds are normal. She exhibits no distension and no mass. There is no hepatosplenomegaly. There is tenderness (pressure) in the suprapubic area. There is no rebound, no guarding and no CVA tenderness. No hernia.  Genitourinary: Vagina normal. Pelvic exam was performed with patient supine. There is no rash, tenderness or lesion on the right  labia. There is no rash, tenderness or lesion on the left labia. No vaginal discharge found.  Genitourinary Comments: Wet prep collected Urethral caruncle present  Nursing note and vitals reviewed.  Results for orders placed or performed in visit on 08/26/17  POCT Urinalysis Dipstick (Automated)  Result Value Ref Range   Color, UA yellow    Clarity, UA clear    Glucose, UA negative    Bilirubin, UA negative    Ketones, UA negative    Spec Grav, UA 1.015 1.010 - 1.025   Blood, UA trace    pH, UA 6.0 5.0 - 8.0   Protein, UA negative    Urobilinogen, UA 0.2 0.2 or 1.0 E.U./dL   Nitrite, UA negative    Leukocytes, UA Small (1+) (A) Negative      Assessment & Plan:   Problem List Items Addressed This Visit    Dysuria - Primary    Recurrent UTI symptoms however has had prior negative UCx when symptoms were present. UA/micro today more consistent with contamination than infection. UCx sent. Possible interstitial cystitis diagnosis given longevity of symptoms. Will await UCx results then discuss treatment options including return to urology. NSAIDs limited by coumadin use.       Relevant Orders   Urine Culture   POCT Urinalysis Dipstick (Automated) (Completed)    Other Visit Diagnoses    Vaginal discharge       Relevant Orders   WET PREP BY MOLECULAR PROBE       No orders of the defined types were placed in this encounter.  Orders Placed This Encounter  Procedures  . WET PREP BY MOLECULAR PROBE  . Urine Culture  . POCT Urinalysis Dipstick (Automated)    Follow up plan: No follow-ups on file.  Ria Bush, MD

## 2017-08-27 LAB — URINE CULTURE
MICRO NUMBER:: 90495830
SPECIMEN QUALITY:: ADEQUATE

## 2017-08-27 LAB — WET PREP BY MOLECULAR PROBE
Candida species: NOT DETECTED
Gardnerella vaginalis: NOT DETECTED
MICRO NUMBER:: 90495829
SPECIMEN QUALITY:: ADEQUATE
Trichomonas vaginosis: NOT DETECTED

## 2017-09-02 ENCOUNTER — Encounter: Payer: PPO | Admitting: Internal Medicine

## 2017-09-02 ENCOUNTER — Other Ambulatory Visit: Payer: Self-pay | Admitting: Family Medicine

## 2017-09-03 NOTE — Telephone Encounter (Signed)
Please advise 

## 2017-09-04 ENCOUNTER — Ambulatory Visit (INDEPENDENT_AMBULATORY_CARE_PROVIDER_SITE_OTHER): Payer: PPO

## 2017-09-04 DIAGNOSIS — Z7901 Long term (current) use of anticoagulants: Secondary | ICD-10-CM | POA: Diagnosis not present

## 2017-09-04 DIAGNOSIS — E538 Deficiency of other specified B group vitamins: Secondary | ICD-10-CM

## 2017-09-04 DIAGNOSIS — Z7902 Long term (current) use of antithrombotics/antiplatelets: Secondary | ICD-10-CM

## 2017-09-04 LAB — POCT INR: INR: 2.9

## 2017-09-04 MED ORDER — CYANOCOBALAMIN 1000 MCG/ML IJ SOLN
1000.0000 ug | Freq: Once | INTRAMUSCULAR | Status: AC
  Administered 2017-09-04: 1000 ug via INTRAMUSCULAR

## 2017-09-04 NOTE — Telephone Encounter (Signed)
Patient is compliant with coumadin management.  Will refill X 6 months.   

## 2017-09-04 NOTE — Patient Instructions (Signed)
INR today 2.9  Patient is to continue taking 2mg  daily EXCEPT for 3mg  on Sun, Tues and Saturday.  Recheck in 4 weeks.    Patient is doing well without any changes in diet, health or medications.  Also, Vitamin B12 injection administered IM to R deltoid. (see documentation of administration).  Patient tolerated well.

## 2017-09-10 ENCOUNTER — Other Ambulatory Visit: Payer: Self-pay | Admitting: Cardiovascular Disease

## 2017-09-16 ENCOUNTER — Ambulatory Visit: Payer: PPO | Admitting: Cardiovascular Disease

## 2017-09-18 ENCOUNTER — Encounter: Payer: Self-pay | Admitting: Family Medicine

## 2017-09-18 ENCOUNTER — Ambulatory Visit (INDEPENDENT_AMBULATORY_CARE_PROVIDER_SITE_OTHER): Payer: PPO | Admitting: Family Medicine

## 2017-09-18 ENCOUNTER — Ambulatory Visit: Payer: Self-pay | Admitting: *Deleted

## 2017-09-18 VITALS — BP 144/78 | HR 76 | Temp 96.6°F | Ht 66.25 in | Wt 187.8 lb

## 2017-09-18 DIAGNOSIS — R300 Dysuria: Secondary | ICD-10-CM | POA: Diagnosis not present

## 2017-09-18 DIAGNOSIS — N390 Urinary tract infection, site not specified: Secondary | ICD-10-CM

## 2017-09-18 DIAGNOSIS — R42 Dizziness and giddiness: Secondary | ICD-10-CM | POA: Diagnosis not present

## 2017-09-18 DIAGNOSIS — N184 Chronic kidney disease, stage 4 (severe): Secondary | ICD-10-CM | POA: Diagnosis not present

## 2017-09-18 DIAGNOSIS — R350 Frequency of micturition: Secondary | ICD-10-CM | POA: Diagnosis not present

## 2017-09-18 LAB — CBC WITH DIFFERENTIAL/PLATELET
Basophils Absolute: 0 10*3/uL (ref 0.0–0.1)
Basophils Relative: 1.2 % (ref 0.0–3.0)
Eosinophils Absolute: 0.1 10*3/uL (ref 0.0–0.7)
Eosinophils Relative: 2.5 % (ref 0.0–5.0)
HCT: 33.4 % — ABNORMAL LOW (ref 36.0–46.0)
Hemoglobin: 10.6 g/dL — ABNORMAL LOW (ref 12.0–15.0)
Lymphocytes Relative: 27.2 % (ref 12.0–46.0)
Lymphs Abs: 1 10*3/uL (ref 0.7–4.0)
MCHC: 31.6 g/dL (ref 30.0–36.0)
MCV: 72.7 fl — ABNORMAL LOW (ref 78.0–100.0)
Monocytes Absolute: 0.6 10*3/uL (ref 0.1–1.0)
Monocytes Relative: 15.2 % — ABNORMAL HIGH (ref 3.0–12.0)
Neutro Abs: 2.1 10*3/uL (ref 1.4–7.7)
Neutrophils Relative %: 53.9 % (ref 43.0–77.0)
Platelets: 199 10*3/uL (ref 150.0–400.0)
RBC: 4.59 Mil/uL (ref 3.87–5.11)
RDW: 20.8 % — ABNORMAL HIGH (ref 11.5–15.5)
WBC: 3.8 10*3/uL — ABNORMAL LOW (ref 4.0–10.5)

## 2017-09-18 LAB — POC URINALSYSI DIPSTICK (AUTOMATED)
Bilirubin, UA: NEGATIVE
Blood, UA: 200
Glucose, UA: NEGATIVE
Ketones, UA: NEGATIVE
Nitrite, UA: NEGATIVE
Protein, UA: NEGATIVE
Spec Grav, UA: 1.01 (ref 1.010–1.025)
Urobilinogen, UA: 0.2 E.U./dL
pH, UA: 6 (ref 5.0–8.0)

## 2017-09-18 LAB — RENAL FUNCTION PANEL
Albumin: 4.2 g/dL (ref 3.5–5.2)
BUN: 21 mg/dL (ref 6–23)
CO2: 31 mEq/L (ref 19–32)
Calcium: 9.6 mg/dL (ref 8.4–10.5)
Chloride: 100 mEq/L (ref 96–112)
Creatinine, Ser: 1.42 mg/dL — ABNORMAL HIGH (ref 0.40–1.20)
GFR: 37.69 mL/min — ABNORMAL LOW (ref >60.00)
Glucose, Bld: 78 mg/dL (ref 70–99)
Phosphorus: 3.8 mg/dL (ref 2.3–4.6)
Potassium: 4.2 mEq/L (ref 3.5–5.1)
Sodium: 138 mEq/L (ref 135–145)

## 2017-09-18 NOTE — Telephone Encounter (Signed)
Pt called stating that she has been feeling dizzy since Sunday 09/14/17; she also says that her BP was 99/42 on 09/17/17 at 2000; 101/40 at 09/17/17 at 2200, and this morning at 1030 149/70's; she also reports urinary frequency, pain and a little blood in urine; the pt states the pain has been ongoing; at 1050 09/17/17 her BP was 114/63 pulse 64 (taken with home automatic BP cuff on right arm(sitting down); recommendations made per nurse triage to include PCP triage; spoke with [facility] and she states that pt does need to be evaluated in office but if pt does not want to be seen in office then she should go to ED; pt offered and accepted appointment with Dr Glori Bickers 09/18/17 at 1230; pt verbalizes understanding; will route to office for notification of this upcoming appointment.   Reason for Disposition . SEVERE dizziness (e.g., unable to stand, requires support to walk, feels like passing out now)  Answer Assessment - Initial Assessment Questions 1. DESCRIPTION: "Describe your dizziness."     Light headed 2. LIGHTHEADED: "Do you feel lightheaded?" (e.g., somewhat faint, woozy, weak upon standing)     Weak; have to hold onto something to walk  3. VERTIGO: "Do you feel like either you or the room is spinning or tilting?" (i.e. vertigo)     no 4. SEVERITY: "How bad is it?"  "Do you feel like you are going to faint?" "Can you stand and walk?"   - MILD - walking normally   - MODERATE - interferes with normal activities (e.g., work, school)    - SEVERE - unable to stand, requires support to walk, feels like passing out now.      Requires support to walk 5. ONSET:  "When did the dizziness begin?"     09/14/17 6. AGGRAVATING FACTORS: "Does anything make it worse?" (e.g., standing, change in head position)     standing 7. HEART RATE: "Can you tell me your heart rate?" "How many beats in 15 seconds?"  (Note: not all patients can do this)       HR 62 per pt 8. CAUSE: "What do you think is causing the dizziness?"   unsure 9. RECURRENT SYMPTOM: "Have you had dizziness before?" If so, ask: "When was the last time?" "What happened that time?"     Yes; "a while ago" 10. OTHER SYMPTOMS: "Do you have any other symptoms?" (e.g., fever, chest pain, vomiting, diarrhea, bleeding)       Urinary frequency, blood in urine; ongoing painful urination 11. PREGNANCY: "Is there any chance you are pregnant?" "When was your last menstrual period?"       no  Protocols used: DIZZINESS [facility]

## 2017-09-18 NOTE — Assessment & Plan Note (Signed)
Ongoing-acute on chronic Hematuria in pt on warfarin  Urine cx sent  Enc pt strongly to set up her urologic f/u

## 2017-09-18 NOTE — Assessment & Plan Note (Signed)
In complex patient with chronic urinary symptoms and dizziness  Lab today  Seeing neprhology

## 2017-09-18 NOTE — Patient Instructions (Addendum)
You should use a walker when you feel dizzy to prevent falls  A walker gives you more stability than a cane    I will send your urine for a culture  I doubt it is an infection  Follow up with your urologist to discuss the urinary issues   Labs today

## 2017-09-18 NOTE — Progress Notes (Signed)
Subjective:    Patient ID: [name], female    DOB: January 10, 1934, 84 y.o.   MRN: [id number]  HPI Here for urinary symptoms  Hematuria - trace in pad  Has some incontinence baseline  Urinary frequency since yesterday Also painful to urinate - this is her baseline  Plans to make an appt with her urologist   Light headedness /dizziness   (episodic for months)  This bout started Saturday  Is careful not to fall  Not particularly spinning   Right ear bothered her briefly last night - not today  Has sinus symptoms  Some hoarsness and sneezing and runny nose  No fever  She has allergies  Takes zyrtec daily  No nasal sprays   CT scan 7/18 -no acute changes  Show images for CT Head Wo Contrast  Study Result   CLINICAL DATA:  Worsening dizziness for the past 3 weeks. History of renal cell carcinoma. History of TIA approximately 5 years ago.  EXAM: CT HEAD WITHOUT CONTRAST  TECHNIQUE: Contiguous axial images were obtained from the base of the skull through the vertex without intravenous contrast.  COMPARISON:  08/16/2014  FINDINGS: Brain: Mild atrophy with sulcal prominence. Bilateral basal ganglial calcifications. Scattered periventricular hypodensities compatible microvascular ischemic disease. No CT evidence of acute large territory infarct. No intraparenchymal or extra-axial mass or hemorrhage. Unchanged size and configuration of the ventricles and the basilar cisterns.  Vascular: Intracranial atherosclerosis.  Skull: No displaced calvarial fracture.  Sinuses/Orbits: Limited visualization of the paranasal sinuses and mastoid air cells is normal. No air-fluid levels.  Other: Regional soft tissues appear normal.  IMPRESSION: Progressive atrophy and microvascular ischemic disease without definitive superimposed acute intracranial process.   Electronically Signed   By: Sandi Mariscal M.D.   On: 11/27/2016 13:40      BP Readings from Last 3  Encounters:  09/18/17 (!) 144/78  08/26/17 124/70  08/18/17 130/60   Pulse Readings from Last 3 Encounters:  09/18/17 76  08/26/17 70  08/18/17 64     84 yo pt of NP Gessner    UA - sm leuk /large blood   Results for orders placed or performed in visit on 09/18/17  POCT Urinalysis Dipstick (Automated)  Result Value Ref Range   Color, UA Yellow    Clarity, UA Clear    Glucose, UA Negative    Bilirubin, UA Negative    Ketones, UA Negative    Spec Grav, UA 1.010 1.010 - 1.025   Blood, UA 200 Ery/uL    pH, UA 6.0 5.0 - 8.0   Protein, UA Negative    Urobilinogen, UA 0.2 0.2 or 1.0 E.U./dL   Nitrite, UA Negative    Leukocytes, UA Small (1+) (A) Negative     Complex hx of recurrent utis  Last visit in April- d/o dysuria with neg urine cx  ? Regarding poss IC at that time     H/o R nephrectomy for renal cancer in 2003   Also hx of CKD stage 4 by chart Lab Results  Component Value Date   CREATININE 1.53 (H) 03/04/2017   BUN 17 03/04/2017   NA 142 03/04/2017   K 4.5 03/04/2017   CL 100 03/04/2017   CO2 20 03/04/2017  she sees renal for this    Takes coumadin for a fib  Lab Results  Component Value Date   INR 2.9 09/04/2017   INR 2.1 08/07/2017   INR 2.5 07/10/2017   Patient Active Problem List  Diagnosis Date Noted  . Urinary frequency 09/18/2017  . Iliac artery stenosis, bilateral ([place]) 07/14/2017  . Recurrent UTI 07/14/2017  . Long term (current) use of anticoagulants 05/08/2017  . Microcytic anemia 11/18/2016  . Dysuria 11/18/2016  . Pulmonary hypertension ([place]) 12/04/2015  . B12 deficiency 05/22/2015  . PAD (peripheral artery disease) ([place])   . Elevated serum creatinine   . Renal cell carcinoma ([place])   . S/p nephrectomy   . TIA (transient ischemic attack) 08/16/2014  . Weakness 01/14/2014  . Chronic kidney disease (CKD), stage IV (severe) ([place]) 12/08/2013  . Encounter for long-term (current) use of antiplatelets/antithrombotics 06/07/2013  .  Chronic diastolic CHF (congestive heart failure) ([place]) 06/04/2013  . Mitral valve regurgitation 06/04/2013  . Allergic rhinitis 01/01/2013  . Chronic cholecystitis with calculus 09/15/2012  . Claudication ([place]) 01/21/2012  . Dizziness 11/26/2011  . Hypertension 05/20/2011  . Avascular necrosis of hip ([place]) 05/03/2011  . CARCINOMA, RENAL CELL 10/13/2009  . OTHER AND UNSPECIFIED COAGULATION DEFECTS 10/13/2009  . RECTAL BLEEDING 10/13/2009  . HOCM / IHSS 07/14/2009  . EDEMA 07/14/2009  . Hyperlipidemia 04/10/2009  . ADJUSTMENT DISORDER WITH ANXIETY 04/10/2009  . INCONTINENCE, URGE 04/10/2009  . Atrial fibrillation ([place]) 10/27/2008  . SINUS BRADYCARDIA 10/27/2008  . Carotid artery stenosis 10/27/2008  . COPD (chronic obstructive pulmonary disease) ([place]) 10/27/2008  . Implantable cardioverter-defibrillator (ICD) in situ 10/27/2008   Past Medical History:  Diagnosis Date  . Adjustment disorder with anxiety   . Anginal pain ([place])   . Arthritis    "some in my hands" (11/11/2012)  . Atrial fibrillation ([place])   . Automatic implantable cardioverter-defibrillator in situ   . Blood in stool   . Carotid artery stenosis 09/2007   60-79% bilateral (stable)  . Carotid artery stenosis 10/2008   40-59% R 60-79%   . Chest pain, unspecified   . Chronic airway obstruction, not elsewhere classified   . Chronic bronchitis ([place])    "get it some; not q year" (11/11/2012)  . Chronic diastolic CHF (congestive heart failure) ([place]) 06/04/2013  . Chronic kidney disease, unspecified   . Coronary artery disease    non-obstructive by 2006 cath  . Dysuria   . Edema   . Encounter for therapeutic drug monitoring   . Exertional shortness of breath   . Frequent UTI    "get them a couple times/yr" (11/11/2012)  . Hemorrhage of rectum and anus   . High cholesterol   . History of blood transfusion 04/2011   "after hip OR" (11/11/2012)  . Hypertension 05/20/2011  . Hypertr obst cardiomyop   . Hypotension,  unspecified    cardiac cath 2006..nonobstructive CAD 30-40s lesions.Marland KitchenETT 1/09 nondiagnostic due to poor HR response..Right Renal Cancer 2003  . Long term (current) use of anticoagulants   . Malignant neoplasm of kidney, except pelvis   . Osteoarthritis of right hip   . Other acute sinusitis   . Other and unspecified coagulation defects   . Other malaise and fatigue   . Other screening mammogram   . PONV (postoperative nausea and vomiting)   . Pre-operative cardiovascular examination   . Renal cancer ([place]) 06/2001   Right  . Secondary cardiomyopathy, unspecified   . Sinus bradycardia   . Special screening for osteoporosis   . Urge incontinence    Past Surgical History:  Procedure Laterality Date  . ABDOMINAL HYSTERECTOMY  1975   for benign causes  . APPENDECTOMY    . BI-VENTRICULAR PACEMAKER UPGRADE  05/04/2010  . CARDIAC  CATHETERIZATION  2006  . CATARACT EXTRACTION W/ INTRAOCULAR LENS  IMPLANT, BILATERAL  01/2006-02-2006  . CHOLECYSTECTOMY N/A 11/11/2012   Procedure: LAPAROSCOPIC CHOLECYSTECTOMY WITH INTRAOPERATIVE CHOLANGIOGRAM;  Surgeon: Imogene Burn. Georgette Dover, MD;  Location: [facility];  Service: General;  Laterality: N/A;  . EP IMPLANTABLE DEVICE N/A 02/21/2016   Procedure: ICD Generator Changeout;  Surgeon: Deboraha Sprang, MD;  Location: [facility];  Service: Cardiovascular;  Laterality: N/A;  . INSERT / REPLACE / REMOVE PACEMAKER  05-01-11   02-28-05-/05-04-10-ICD-MEDTRONIC MAXIMAL DR  . JOINT REPLACEMENT    . LAPAROSCOPIC CHOLECYSTECTOMY  11/11/2012  . LAPAROSCOPIC LYSIS OF ADHESIONS N/A 11/11/2012   Procedure: LAPAROSCOPIC LYSIS OF ADHESIONS;  Surgeon: Imogene Burn. Georgette Dover, MD;  Location: [facility];  Service: General;  Laterality: N/A;  . NEPHRECTOMY Right 06/2001    S/P RENAL CELL CANCER  . TOTAL HIP ARTHROPLASTY Right 05/03/2011   Procedure: TOTAL HIP ARTHROPLASTY ANTERIOR APPROACH;  Surgeon: Mcarthur Rossetti;  Location: [facility];  Service: Orthopedics;  Laterality: Right;  Removal  of Cannulated Screws Right Hip, Right Direct Anterior Hip Replacement   Social History   Tobacco Use  . Smoking status: Former Smoker    Packs/day: 0.50    Years: 40.00    Pack years: 20.00    Types: Cigarettes    Last attempt to quit: 05/06/2001    Years since quitting: 16.3  . Smokeless tobacco: Never Used  Substance Use Topics  . Alcohol use: No    Alcohol/week: 0.0 oz  . Drug use: No   Family History  Problem Relation Age of Onset  . Heart failure Mother   . Breast cancer Maternal Aunt 82  . Breast cancer Cousin   . Colon cancer Neg Hx    Allergies  Allergen Reactions  . Codeine Nausea And Vomiting  . Morphine And Related Nausea And Vomiting  . Sulfonamide Derivatives Other (See Comments)    REACTION: Dry mouth   Current Outpatient Medications on File Prior to Visit  Medication Sig Dispense Refill  . acetaminophen (TYLENOL) 500 MG tablet Take 1,000 mg by mouth every 6 (six) hours as needed for headache.    . carvedilol (COREG) 12.5 MG tablet TAKE 1 TABLET BY MOUTH TWICE (2) DAILY WITH A MEAL 180 tablet 3  . cetirizine (ZYRTEC) 10 MG tablet Take 10 mg by mouth daily.     . disopyramide (NORPACE) 150 MG capsule TAKE 1 CAPSULE BY MOUTH TWICE DAILY 60 capsule 9  . estradiol (ESTRACE) 0.1 MG/GM vaginal cream Place 1 g 3 (three) times a week vaginally. Pea size about per urethra 42.5 g 12  . ezetimibe (ZETIA) 10 MG tablet Take 1 tablet (10 mg total) by mouth daily. 30 tablet 11  . furosemide (LASIX) 40 MG tablet Take 1 tablet (40 mg total) by mouth as directed. Three times daily and as needed for swelling. 120 tablet 3  . metolazone (ZAROXOLYN) 2.5 MG tablet Take 1 tablet (2.5mg  total) 30 minutes prior to morning dose of Furosemide. Do not take any further doses unless directed by physician. 3 tablet 0  . rosuvastatin (CRESTOR) 40 MG tablet Take 1 tablet (40 mg total) by mouth daily. 90 tablet 3  . sildenafil (REVATIO) 20 MG tablet Take 1 tablet (20 mg total) by mouth 3 (three)  times daily. 90 tablet 6  . warfarin (COUMADIN) 2 MG tablet TAKE DAILY AS DIRECTED BY COUMADIN CLINIC 130 tablet 1   No current facility-administered medications on file prior to visit.  Review of Systems  Constitutional: Positive for fatigue. Negative for activity change, appetite change, fever and unexpected weight change.  HENT: Positive for postnasal drip and rhinorrhea. Negative for congestion, ear pain, sinus pressure and sore throat.   Eyes: Negative for pain, redness and visual disturbance.  Respiratory: Negative for cough, shortness of breath and wheezing.   Cardiovascular: Negative for chest pain and palpitations.  Gastrointestinal: Negative for abdominal pain, blood in stool, constipation, diarrhea and nausea.  Endocrine: Negative for polydipsia and polyuria.  Genitourinary: Positive for dysuria, frequency, hematuria and urgency. Negative for flank pain, pelvic pain and vaginal bleeding.  Musculoskeletal: Positive for arthralgias. Negative for back pain and myalgias.  Skin: Negative for pallor and rash.  Allergic/Immunologic: Negative for environmental allergies.  Neurological: Positive for dizziness and light-headedness. Negative for tremors, seizures, syncope, facial asymmetry, speech difficulty, weakness, numbness and headaches.  Hematological: Negative for adenopathy. Does not bruise/bleed easily.  Psychiatric/Behavioral: Negative for decreased concentration and dysphoric mood. The patient is not nervous/anxious.        Objective:   Physical Exam  Constitutional: She is oriented to person, place, and time. She appears well-developed and well-nourished. No distress.  Well appearing   HENT:  Head: Normocephalic and atraumatic.  Right Ear: External ear normal.  Left Ear: External ear normal.  Nose: Nose normal.  Mouth/Throat: Oropharynx is clear and moist. No oropharyngeal exudate.  No sinus tenderness No temporal tenderness  No TMJ tenderness  Eyes: Pupils are  equal, round, and reactive to light. Conjunctivae and EOM are normal. Right eye exhibits no discharge. Left eye exhibits no discharge. No scleral icterus.  No nystagmus  Neck: Normal range of motion and full passive range of motion without pain. Neck supple. No JVD present. Carotid bruit is not present. No tracheal deviation present. No thyromegaly present.  Cardiovascular: Normal rate and normal heart sounds.  No murmur heard. Pulmonary/Chest: Effort normal and breath sounds normal. No respiratory distress. She has no wheezes. She has no rales.  Abdominal: Soft. Bowel sounds are normal. She exhibits no distension and no mass. There is tenderness. There is no rebound.  No cva tenderness  Mild suprapubic tenderness  Musculoskeletal: She exhibits no edema or tenderness.  Lymphadenopathy:    She has no cervical adenopathy.  Neurological: She is alert and oriented to person, place, and time. She has normal strength and normal reflexes. She displays no atrophy, no tremor and normal reflexes. No cranial nerve deficit or sensory deficit. She exhibits normal muscle tone. She displays a negative Romberg sign. Coordination and gait normal.  No focal cerebellar signs   Skin: Skin is warm and dry. No rash noted. No pallor.  Psychiatric: She has a normal mood and affect. Her behavior is normal. Thought content normal.          Assessment & Plan:   Problem List Items Addressed This Visit      Genitourinary   Chronic kidney disease (CKD), stage IV (severe) (HCC)    In complex patient with chronic urinary symptoms and dizziness  Lab today  Seeing neprhology      Relevant Orders   Renal function panel (Completed)   Recurrent UTI   Relevant Orders   Urine Culture     Other   Dizziness - Primary    Very long hx of episodic dizziness in elderly female with complex hx  No focal findings on exam  Per pt-extensive w/u by cardiol and pcp  Will check renal panel  Ref to neurology  Relevant Orders   CBC with Differential/Platelet (Completed)   Renal function panel (Completed)   Ambulatory referral to Neurology   Dysuria    Ongoing-acute on chronic Hematuria in pt on warfarin  Urine cx sent  Enc pt strongly to set up her urologic f/u        Relevant Orders   POCT Urinalysis Dipstick (Automated) (Completed)   Urinary frequency   Relevant Orders   Urine Culture

## 2017-09-18 NOTE — Assessment & Plan Note (Signed)
Very long hx of episodic dizziness in elderly female with complex hx  No focal findings on exam  Per pt-extensive w/u by cardiol and pcp  Will check renal panel  Ref to neurology

## 2017-09-20 LAB — URINE CULTURE
MICRO NUMBER:: 90598176
SPECIMEN QUALITY:: ADEQUATE

## 2017-09-21 ENCOUNTER — Other Ambulatory Visit: Payer: Self-pay | Admitting: Family Medicine

## 2017-09-21 NOTE — Telephone Encounter (Signed)
Urine did grow some e coli bacteria  Please send keflex to pharmacy of choice   I pended it   Alert [place] if no improvement

## 2017-09-22 MED ORDER — CEPHALEXIN 250 MG PO CAPS: 250.0000 mg | ORAL_CAPSULE | Freq: Two times a day (BID) | ORAL | 0 refills | Status: DC

## 2017-09-22 NOTE — Telephone Encounter (Signed)
Pt notified of urine cx results and Dr. Tower's comments. Rx sent to pharmacy  

## 2017-09-22 NOTE — Telephone Encounter (Signed)
Called home # and it was busy, called cell # on file and no answer and no VM box set up  CRM created

## 2017-10-06 ENCOUNTER — Ambulatory Visit (INDEPENDENT_AMBULATORY_CARE_PROVIDER_SITE_OTHER): Payer: PPO

## 2017-10-06 ENCOUNTER — Telehealth: Payer: Self-pay

## 2017-10-06 DIAGNOSIS — Z95810 Presence of automatic (implantable) cardiac defibrillator: Secondary | ICD-10-CM

## 2017-10-06 DIAGNOSIS — I5032 Chronic diastolic (congestive) heart failure: Secondary | ICD-10-CM | POA: Diagnosis not present

## 2017-10-06 NOTE — Progress Notes (Signed)
EPIC Encounter for ICM Monitoring  Patient Name: [name] is a 84 y.o. female Date: 10/06/2017 Primary Care Physican: Elby Beck, [place] Primary Cardiologist: Rockey Situ Electrophysiologist: Caryl Comes Dry Weight: 183-185 lbs   Clinical Status (25-Aug-2017 to 06-Oct-2017) Treated VT/VF 0 episodes AT/AF 135 episodes  Time in AT/AF 16.4 hr/day (68.4%)  Observations (1) (25-Aug-2017 to 06-Oct-2017)  AT/AF >= 6 hr for 30 days.      Heart Failure questions reviewed, pt reporting dizziness while sitting and needs assistance walking and swelling in legs and feet the last week.  Swelling resolved after taking extra Lasix yesterday.    Thoracic impedance normal but was abnormal suggesting fluid accumulation in the last week.  She took extra Furosemide yesterday.  Prescribed dosage: Furosemide 40 mg take 1 tablet (40 mg total)three times daily and as needed for swelling.Patient self adjusts Furosemide as needed.   Labs: 03/04/2017 Creatinine 1.53, BUN 17, Potassium 4.5, Sodium 142, EGFR 32-37 11/07/2016 Creatinine 1.50, BUN 17, Potassium 4.0, Sodium 139, EGFR 35.45 06/03/2016 Creatinine 1.61, BUN 15, Potassium 4.6, Sodium 141, EGFR 30-35 10/26/2017Creatinine 1.63, BUN 19, Potassium 3.5, Sodium 139, EGFR 29-33  03/19/2016 Creatinine 1.59, BUN 20, Potassium 4.1, Sodium 141  02/12/2016 Creatinine 1.70, BUN 20, Potassium 3.8, Sodium 139  12/26/2015 Creatinine 1.54, BUN 17, Potassium 4.0, Sodium 139, EGFR 31-36  07/27/2015 Creatinine 1.30, BUN 14, Potassium 4.9, Sodium 143, EGFR 39-45  Recommendations: No changes.  Encouraged to call for fluid symptoms.  Follow-up plan: ICM clinic phone appointment on 11/07/2017.  Office appointment scheduled 10/07/2017 with Dr. Caryl Comes.  Copy of ICM check sent to Dr. Caryl Comes.   3 month ICM trend: 10/06/2017    1 Year ICM trend:       Rosalene Billings, RN 10/06/2017 9:55 AM

## 2017-10-06 NOTE — Telephone Encounter (Signed)
Spoke with pt and reminded pt of remote transmission that is due today. Assisted patient in sending transmission.

## 2017-10-07 ENCOUNTER — Encounter: Payer: Self-pay | Admitting: Internal Medicine

## 2017-10-07 ENCOUNTER — Ambulatory Visit: Payer: PPO | Admitting: Internal Medicine

## 2017-10-07 VITALS — BP 135/66 | HR 68 | Ht 66.5 in | Wt 185.2 lb

## 2017-10-07 DIAGNOSIS — Z95810 Presence of automatic (implantable) cardiac defibrillator: Secondary | ICD-10-CM

## 2017-10-07 DIAGNOSIS — I481 Persistent atrial fibrillation: Secondary | ICD-10-CM

## 2017-10-07 DIAGNOSIS — I4819 Other persistent atrial fibrillation: Secondary | ICD-10-CM

## 2017-10-07 DIAGNOSIS — I442 Atrioventricular block, complete: Secondary | ICD-10-CM

## 2017-10-07 DIAGNOSIS — I495 Sick sinus syndrome: Secondary | ICD-10-CM

## 2017-10-07 DIAGNOSIS — I428 Other cardiomyopathies: Secondary | ICD-10-CM | POA: Diagnosis not present

## 2017-10-07 NOTE — Patient Instructions (Signed)
Medication Instructions: - Your physician recommends that you continue on your current medications as directed. Please refer to the Current Medication list given to you today.  Labwork: - none ordered  Procedures/Testing: - none ordered  Follow-Up: - Your physician recommends that you schedule a follow-up appointment in: 3 months with Dr. Caryl Comes.   Any Additional Special Instructions Will Be Listed Below (If Applicable). - keep a calendar of times when you are having dizziness or when you are checking your orthostatic blood pressure readings (lying, sitting, & standing), please bring these to your next appointment with Dr. Caryl Comes.    If you need a refill on your cardiac medications before your next appointment, please call your pharmacy.

## 2017-10-07 NOTE — Progress Notes (Signed)
Patient Care Team: Elby Beck, FNP as PCP - General (Nurse Practitioner) Minna Merritts, MD as Consulting Physician (Cardiology) Deboraha Sprang, MD as Consulting Physician (Cardiology) Delana Meyer, Dolores Lory, MD as Consulting Physician (Vascular Surgery)   HPI  [name] is a 84 y.o. female Seen in followup for nonischemic cardiomyopathy for which she has a primary prevention ICD implanted initially 2006 with generator replacement about 5 years ago and replacement again 10/17   There had been puffiness and concern re infection;  cultures at change out were neg.  This is much improved  She again has had UTIs  Temporally related to this has been an aggravation of an intermittent but chronic dizziness.  Aggravated by standing relieved by sitting and/or lying down  She thinks that she is aware of her atrial fibrillation   She has persistent  atrial fibrillation; previously  treated with disopyramide changed to  flecainide. But 2/2 side effects  back on disopyramide. ( the d/c summary 4/16 was WRONG-describing the use of amiodarone)        Cath 2006 without obstructive disease DATE TEST EF   1/15 Echo  65 % Mod Pulm HTN, TR and Mild MR   4/16 Echo   65 % Mod-sev PulmHTN with RVE, Mod MR severe TR  6/17 Echo  65% Mild-mod MR  12/18 Myoview 65% No ischemia    Anticoagulation w warfarin;  No bleeding issues       Date Cr K Hgb  12/17 1.63 3.5 9.5  7/18 1.5 4.0 10.4  5/19 1.42 4.2 10.6   Ferritin 7/18 was 19.1  CHADS-VASc score of greater than or equal to 6 (age-33, gender-1, TIA-2 (4/16), heart failure-1)     She has a great grandson [name] to whom she is very attached.  He is a Furniture conservator/restorer and a cross-country running   Past Medical History:  Diagnosis Date  . Adjustment disorder with anxiety   . Anginal pain ([place])   . Arthritis    "some in my hands" (11/11/2012)  . Atrial fibrillation ([place])   . Automatic implantable cardioverter-defibrillator in  situ   . Blood in stool   . Carotid artery stenosis 09/2007   60-79% bilateral (stable)  . Carotid artery stenosis 10/2008   40-59% R 60-79%   . Chest pain, unspecified   . Chronic airway obstruction, not elsewhere classified   . Chronic bronchitis ([place])    "get it some; not q year" (11/11/2012)  . Chronic diastolic CHF (congestive heart failure) ([place]) 06/04/2013  . Chronic kidney disease, unspecified   . Coronary artery disease    non-obstructive by 2006 cath  . Dysuria   . Edema   . Encounter for therapeutic drug monitoring   . Exertional shortness of breath   . Frequent UTI    "get them a couple times/yr" (11/11/2012)  . Hemorrhage of rectum and anus   . High cholesterol   . History of blood transfusion 04/2011   "after hip OR" (11/11/2012)  . Hypertension 05/20/2011  . Hypertr obst cardiomyop   . Hypotension, unspecified    cardiac cath 2006..nonobstructive CAD 30-40s lesions.Marland KitchenETT 1/09 nondiagnostic due to poor HR response..Right Renal Cancer 2003  . Long term (current) use of anticoagulants   . Malignant neoplasm of kidney, except pelvis   . Osteoarthritis of right hip   . Other acute sinusitis   . Other and unspecified coagulation defects   . Other malaise and fatigue   .  Other screening mammogram   . PONV (postoperative nausea and vomiting)   . Pre-operative cardiovascular examination   . Renal cancer ([place]) 06/2001   Right  . Secondary cardiomyopathy, unspecified   . Sinus bradycardia   . Special screening for osteoporosis   . Urge incontinence     Past Surgical History:  Procedure Laterality Date  . ABDOMINAL HYSTERECTOMY  1975   for benign causes  . APPENDECTOMY    . BI-VENTRICULAR PACEMAKER UPGRADE  05/04/2010  . CARDIAC CATHETERIZATION  2006  . CATARACT EXTRACTION W/ INTRAOCULAR LENS  IMPLANT, BILATERAL  01/2006-02-2006  . CHOLECYSTECTOMY N/A 11/11/2012   Procedure: LAPAROSCOPIC CHOLECYSTECTOMY WITH INTRAOPERATIVE CHOLANGIOGRAM;  Surgeon: Imogene Burn. Georgette Dover, MD;   Location: [facility];  Service: General;  Laterality: N/A;  . EP IMPLANTABLE DEVICE N/A 02/21/2016   Procedure: ICD Generator Changeout;  Surgeon: Deboraha Sprang, MD;  Location: [facility];  Service: Cardiovascular;  Laterality: N/A;  . INSERT / REPLACE / REMOVE PACEMAKER  05-01-11   02-28-05-/05-04-10-ICD-MEDTRONIC MAXIMAL DR  . JOINT REPLACEMENT    . LAPAROSCOPIC CHOLECYSTECTOMY  11/11/2012  . LAPAROSCOPIC LYSIS OF ADHESIONS N/A 11/11/2012   Procedure: LAPAROSCOPIC LYSIS OF ADHESIONS;  Surgeon: Imogene Burn. Georgette Dover, MD;  Location: [facility];  Service: General;  Laterality: N/A;  . NEPHRECTOMY Right 06/2001    S/P RENAL CELL CANCER  . TOTAL HIP ARTHROPLASTY Right 05/03/2011   Procedure: TOTAL HIP ARTHROPLASTY ANTERIOR APPROACH;  Surgeon: Mcarthur Rossetti;  Location: [facility];  Service: Orthopedics;  Laterality: Right;  Removal of Cannulated Screws Right Hip, Right Direct Anterior Hip Replacement    Current Outpatient Medications  Medication Sig Dispense Refill  . acetaminophen (TYLENOL) 500 MG tablet Take 1,000 mg by mouth every 6 (six) hours as needed for headache.    . carvedilol (COREG) 12.5 MG tablet TAKE 1 TABLET BY MOUTH TWICE (2) DAILY WITH A MEAL 180 tablet 3  . cetirizine (ZYRTEC) 10 MG tablet Take 10 mg by mouth daily.     . disopyramide (NORPACE) 150 MG capsule TAKE 1 CAPSULE BY MOUTH TWICE DAILY 60 capsule 9  . ezetimibe (ZETIA) 10 MG tablet Take 1 tablet (10 mg total) by mouth daily. 30 tablet 11  . furosemide (LASIX) 40 MG tablet Take 1 tablet (40 mg total) by mouth as directed. Three times daily and as needed for swelling. 120 tablet 3  . rosuvastatin (CRESTOR) 40 MG tablet Take 1 tablet (40 mg total) by mouth daily. 90 tablet 3  . sildenafil (REVATIO) 20 MG tablet Take 1 tablet (20 mg total) by mouth 3 (three) times daily. 90 tablet 6  . warfarin (COUMADIN) 2 MG tablet TAKE DAILY AS DIRECTED BY COUMADIN CLINIC 130 tablet 1  . metolazone (ZAROXOLYN) 2.5 MG tablet Take 1 tablet  (2.5mg  total) 30 minutes prior to morning dose of Furosemide. Do not take any further doses unless directed by physician. (Patient not taking: Reported on 10/07/2017) 3 tablet 0   No current facility-administered medications for this visit.     Allergies  Allergen Reactions  . Codeine Nausea And Vomiting  . Morphine And Related Nausea And Vomiting  . Sulfonamide Derivatives Other (See Comments)    REACTION: Dry mouth    Review of Systems negative except from HPI and PMH  Physical Exam BP 135/66 (BP Location: Right Arm, Patient Position: Sitting, Cuff Size: Large)   Pulse 68   Ht 5' 6.5" (1.689 m)   Wt 185 lb 4 oz (84 kg)   BMI  29.45 kg/m  Well developed and nourished in no acute distress HENT normal Neck supple with JVP-flat Clear Device pocket well healed; without hematoma or erythema.  There is no tethering  Regular rate and rhythm, no murmurs or gallops Abd-soft with active BS No Clubbing cyanosis edema Skin-warm and dry A & Oriented  Grossly normal sensory and motor function   ECG  Atrial fib/flutter V pacing @ 68 -/17/52   Assessment and  Plan  Nonischemic cardiomyopathy with interval normalization of LV function  Sinus node dysfunction device dependent   Complete heart block intermittent  Atrial fibrillation persistent  High risk medication surveillance-disopyramide  Dizziness   Implantable defibrillator    She is having increasingly frequent atrial fibrillation.  I have broached with her the subject as to whether we discontinue her disopyramide.  We will try and correlate her symptoms with the presence of atrial fibrillation.  At this juncture, I am not sure at least that the dizziness correlates with it.  I have mentioned also that the disopyramide can aggravate her dizziness  No bleeding.  At home, blood pressures have been quite variable she describes a diastolic in the 16W.  We will have her do orthostatic blood pressure recordings at home to see  if we can identify the mechanism of her positional dizziness.  It does not sound vertiginous  We spent more than 50% of our >25 min visit in face to face counseling regarding the above

## 2017-10-09 ENCOUNTER — Ambulatory Visit (INDEPENDENT_AMBULATORY_CARE_PROVIDER_SITE_OTHER): Payer: PPO | Admitting: General Practice

## 2017-10-09 DIAGNOSIS — Z7901 Long term (current) use of anticoagulants: Secondary | ICD-10-CM | POA: Diagnosis not present

## 2017-10-09 DIAGNOSIS — Z7902 Long term (current) use of antithrombotics/antiplatelets: Secondary | ICD-10-CM

## 2017-10-09 DIAGNOSIS — E538 Deficiency of other specified B group vitamins: Secondary | ICD-10-CM

## 2017-10-09 LAB — POCT INR: INR: 2.1 (ref 2.0–3.0)

## 2017-10-09 MED ORDER — CYANOCOBALAMIN 1000 MCG/ML IJ SOLN
1000.0000 ug | Freq: Once | INTRAMUSCULAR | Status: AC
  Administered 2017-10-09: 1000 ug via INTRAMUSCULAR

## 2017-10-09 NOTE — Patient Instructions (Addendum)
Pre visit review using our clinic review tool, if applicable. No additional management support is needed unless otherwise documented below in the visit note.  Take 1 tablet daily except 1 1/2 tablets on Sunday/Tuesdays and Saturdays.  Re-check in 4 weeks.   

## 2017-11-03 ENCOUNTER — Telehealth: Payer: Self-pay | Admitting: Urology

## 2017-11-03 ENCOUNTER — Telehealth: Payer: Self-pay | Admitting: Family Medicine

## 2017-11-03 NOTE — Telephone Encounter (Signed)
I will see her then  

## 2017-11-03 NOTE — Telephone Encounter (Signed)
I spoke with pt and she has burning upon urination with frequency, small amt of blood seen;no fever. Pt wanted female physician; pt scheduled to see Dr Glori Bickers on 11/04/17 at 11:15. FYI to Dr Glori Bickers.

## 2017-11-03 NOTE — Telephone Encounter (Signed)
Patient called the office today with complaint of possible UTI (burning, aching, frequency).    I have added her to the nurse schedule for 7/2 at 1:00pm.  (She has another appointment here at the hospital at 2:00pm and did not want to come out in the heat twice.)

## 2017-11-03 NOTE — Telephone Encounter (Signed)
Copied from [place] [id number]. Topic: Quick Communication - See Telephone Encounter >> Nov 03, 2017 11:01 AM Vernona Rieger wrote: CRM for notification. See Telephone encounter for: 11/03/17.  Patient states she has had UTI symptoms since Thursday. She is schedule for a nurse visit tomorrow at her urologist at 1. She said that she rather come to [facility] to be seen. Can she be worked in?

## 2017-11-04 ENCOUNTER — Ambulatory Visit: Payer: PPO | Admitting: Cardiovascular Disease

## 2017-11-04 ENCOUNTER — Encounter: Payer: Self-pay | Admitting: Family Medicine

## 2017-11-04 ENCOUNTER — Ambulatory Visit: Payer: PPO

## 2017-11-04 ENCOUNTER — Ambulatory Visit (INDEPENDENT_AMBULATORY_CARE_PROVIDER_SITE_OTHER): Payer: PPO | Admitting: Family Medicine

## 2017-11-04 VITALS — BP 142/70 | HR 71 | Temp 97.9°F | Ht 66.5 in | Wt 182.5 lb

## 2017-11-04 DIAGNOSIS — R300 Dysuria: Secondary | ICD-10-CM | POA: Diagnosis not present

## 2017-11-04 DIAGNOSIS — N390 Urinary tract infection, site not specified: Secondary | ICD-10-CM

## 2017-11-04 LAB — POC URINALSYSI DIPSTICK (AUTOMATED)
Bilirubin, UA: NEGATIVE
Blood, UA: 50
Glucose, UA: NEGATIVE
Ketones, UA: NEGATIVE
Nitrite, UA: NEGATIVE
Protein, UA: NEGATIVE
Spec Grav, UA: 1.015 (ref 1.010–1.025)
Urobilinogen, UA: 0.2 E.U./dL
pH, UA: 6 (ref 5.0–8.0)

## 2017-11-04 MED ORDER — CEPHALEXIN 250 MG PO CAPS: 250.0000 mg | ORAL_CAPSULE | Freq: Two times a day (BID) | ORAL | 0 refills | Status: DC

## 2017-11-04 NOTE — Progress Notes (Signed)
Subjective:    Patient ID: [name], female    DOB: 1933-09-08, 84 y.o.   MRN: [id number]  HPI  Here for urinary symptoms   Started Thursday with dysuria and frequency  Now some blood in urine - pink tint  No fever  No nausea  No headaches  Dizzy on and off   84 yo pt of NP [facility]  Wt Readings from Last 3 Encounters:  11/04/17 182 lb 8 oz (82.8 kg)  10/07/17 185 lb 4 oz (84 kg)  09/18/17 187 lb 12 oz (85.2 kg)   BP Readings from Last 3 Encounters:  11/04/17 (!) 142/70  10/07/17 135/66  09/18/17 (!) 258/52    Complicated urinary hx and sees urology  H/o recurrent utis  Last seen here in April for urinary symptoms Also atrophic vaginitis tx with estrace cream  Last cystoscopy 2/19  Was prev on trimethoprim for prophylaxis (not tolerated)  Also hx of R nephrectomy for renal cancer in 2003   On coumadin for a fib   Had pos ucx pos for e coli in may - resistant to quinolones  Treated and felt much better   Lab Results  Component Value Date   CREATININE 1.42 (H) 09/18/2017   BUN 21 09/18/2017   NA 138 09/18/2017   K 4.2 09/18/2017   CL 100 09/18/2017   CO2 31 09/18/2017   UA today: Results for orders placed or performed in visit on 11/04/17  POCT Urinalysis Dipstick (Automated)  Result Value Ref Range   Color, UA Yellow    Clarity, UA Hazy    Glucose, UA Negative Negative   Bilirubin, UA Negaitve    Ketones, UA Negative    Spec Grav, UA 1.015 1.010 - 1.025   Blood, UA 50 Ery/uL    pH, UA 6.0 5.0 - 8.0   Protein, UA Negative Negative   Urobilinogen, UA 0.2 0.2 or 1.0 E.U./dL   Nitrite, UA Negative    Leukocytes, UA Moderate (2+) (A) Negative    Due to see urology end of the mo    Lab Results  Component Value Date   INR 2.1 10/09/2017   INR 2.9 09/04/2017   INR 2.1 08/07/2017   Patient Active Problem List   Diagnosis Date Noted  . Urinary frequency 09/18/2017  . Iliac artery stenosis, bilateral ([place]) 07/14/2017  . Recurrent UTI  07/14/2017  . Long term (current) use of anticoagulants 05/08/2017  . Microcytic anemia 11/18/2016  . Dysuria 11/18/2016  . Pulmonary hypertension ([place]) 12/04/2015  . B12 deficiency 05/22/2015  . PAD (peripheral artery disease) ([place])   . Elevated serum creatinine   . Renal cell carcinoma ([place])   . S/p nephrectomy   . TIA (transient ischemic attack) 08/16/2014  . Weakness 01/14/2014  . Chronic kidney disease (CKD), stage IV (severe) ([place]) 12/08/2013  . Encounter for long-term (current) use of antiplatelets/antithrombotics 06/07/2013  . Chronic diastolic CHF (congestive heart failure) ([place]) 06/04/2013  . Mitral valve regurgitation 06/04/2013  . Allergic rhinitis 01/01/2013  . Chronic cholecystitis with calculus 09/15/2012  . Claudication ([place]) 01/21/2012  . Dizziness 11/26/2011  . Hypertension 05/20/2011  . Avascular necrosis of hip ([place]) 05/03/2011  . CARCINOMA, RENAL CELL 10/13/2009  . OTHER AND UNSPECIFIED COAGULATION DEFECTS 10/13/2009  . RECTAL BLEEDING 10/13/2009  . HOCM / IHSS 07/14/2009  . EDEMA 07/14/2009  . Hyperlipidemia 04/10/2009  . ADJUSTMENT DISORDER WITH ANXIETY 04/10/2009  . INCONTINENCE, URGE 04/10/2009  . Atrial fibrillation ([place]) 10/27/2008  . SINUS  BRADYCARDIA 10/27/2008  . Carotid artery stenosis 10/27/2008  . COPD (chronic obstructive pulmonary disease) ([place]) 10/27/2008  . Implantable cardioverter-defibrillator (ICD) in situ 10/27/2008   Past Medical History:  Diagnosis Date  . Adjustment disorder with anxiety   . Anginal pain ([place])   . Arthritis    "some in my hands" (11/11/2012)  . Atrial fibrillation ([place])   . Automatic implantable cardioverter-defibrillator in situ   . Blood in stool   . Carotid artery stenosis 09/2007   60-79% bilateral (stable)  . Carotid artery stenosis 10/2008   40-59% R 60-79%   . Chest pain, unspecified   . Chronic airway obstruction, not elsewhere classified   . Chronic bronchitis ([place])    "get it some; not q year"  (11/11/2012)  . Chronic diastolic CHF (congestive heart failure) ([place]) 06/04/2013  . Chronic kidney disease, unspecified   . Coronary artery disease    non-obstructive by 2006 cath  . Dysuria   . Edema   . Encounter for therapeutic drug monitoring   . Exertional shortness of breath   . Frequent UTI    "get them a couple times/yr" (11/11/2012)  . Hemorrhage of rectum and anus   . High cholesterol   . History of blood transfusion 04/2011   "after hip OR" (11/11/2012)  . Hypertension 05/20/2011  . Hypertr obst cardiomyop   . Hypotension, unspecified    cardiac cath 2006..nonobstructive CAD 30-40s lesions.Marland KitchenETT 1/09 nondiagnostic due to poor HR response..Right Renal Cancer 2003  . Long term (current) use of anticoagulants   . Malignant neoplasm of kidney, except pelvis   . Osteoarthritis of right hip   . Other acute sinusitis   . Other and unspecified coagulation defects   . Other malaise and fatigue   . Other screening mammogram   . PONV (postoperative nausea and vomiting)   . Pre-operative cardiovascular examination   . Renal cancer ([place]) 06/2001   Right  . Secondary cardiomyopathy, unspecified   . Sinus bradycardia   . Special screening for osteoporosis   . Urge incontinence    Past Surgical History:  Procedure Laterality Date  . ABDOMINAL HYSTERECTOMY  1975   for benign causes  . APPENDECTOMY    . BI-VENTRICULAR PACEMAKER UPGRADE  05/04/2010  . CARDIAC CATHETERIZATION  2006  . CATARACT EXTRACTION W/ INTRAOCULAR LENS  IMPLANT, BILATERAL  01/2006-02-2006  . CHOLECYSTECTOMY N/A 11/11/2012   Procedure: LAPAROSCOPIC CHOLECYSTECTOMY WITH INTRAOPERATIVE CHOLANGIOGRAM;  Surgeon: Imogene Burn. Georgette Dover, MD;  Location: [facility];  Service: General;  Laterality: N/A;  . EP IMPLANTABLE DEVICE N/A 02/21/2016   Procedure: ICD Generator Changeout;  Surgeon: Deboraha Sprang, MD;  Location: [facility];  Service: Cardiovascular;  Laterality: N/A;  . INSERT / REPLACE / REMOVE PACEMAKER  05-01-11    02-28-05-/05-04-10-ICD-MEDTRONIC MAXIMAL DR  . JOINT REPLACEMENT    . LAPAROSCOPIC CHOLECYSTECTOMY  11/11/2012  . LAPAROSCOPIC LYSIS OF ADHESIONS N/A 11/11/2012   Procedure: LAPAROSCOPIC LYSIS OF ADHESIONS;  Surgeon: Imogene Burn. Georgette Dover, MD;  Location: [facility];  Service: General;  Laterality: N/A;  . NEPHRECTOMY Right 06/2001    S/P RENAL CELL CANCER  . TOTAL HIP ARTHROPLASTY Right 05/03/2011   Procedure: TOTAL HIP ARTHROPLASTY ANTERIOR APPROACH;  Surgeon: Mcarthur Rossetti;  [facility];  Service: Orthopedics;  Laterality: Right;  Removal of Cannulated Screws Right Hip, Right Direct Anterior Hip Replacement   Social History   Tobacco Use  . Smoking status: Former Smoker    Packs/day: 0.50    Years: 40.00  Pack years: 20.00    Types: Cigarettes    Last attempt to quit: 05/06/2001    Years since quitting: 16.5  . Smokeless tobacco: Never Used  Substance Use Topics  . Alcohol use: No    Alcohol/week: 0.0 oz  . Drug use: No   Family History  Problem Relation Age of Onset  . Heart failure Mother   . Breast cancer Maternal Aunt 82  . Breast cancer Cousin   . Colon cancer Neg Hx    Allergies  Allergen Reactions  . Codeine Nausea And Vomiting  . Morphine And Related Nausea And Vomiting  . Sulfonamide Derivatives Other (See Comments)    REACTION: Dry mouth   Current Outpatient Medications on File Prior to Visit  Medication Sig Dispense Refill  . acetaminophen (TYLENOL) 500 MG tablet Take 1,000 mg by mouth every 6 (six) hours as needed for headache.    . carvedilol (COREG) 12.5 MG tablet TAKE 1 TABLET BY MOUTH TWICE (2) DAILY WITH A MEAL 180 tablet 3  . cetirizine (ZYRTEC) 10 MG tablet Take 10 mg by mouth daily.     . disopyramide (NORPACE) 150 MG capsule TAKE 1 CAPSULE BY MOUTH TWICE DAILY 60 capsule 9  . ezetimibe (ZETIA) 10 MG tablet Take 1 tablet (10 mg total) by mouth daily. 30 tablet 11  . furosemide (LASIX) 40 MG tablet Take 1 tablet (40 mg total) by mouth as directed.  Three times daily and as needed for swelling. 120 tablet 3  . metolazone (ZAROXOLYN) 2.5 MG tablet Take 1 tablet (2.5mg  total) 30 minutes prior to morning dose of Furosemide. Do not take any further doses unless directed by physician. 3 tablet 0  . rosuvastatin (CRESTOR) 40 MG tablet Take 1 tablet (40 mg total) by mouth daily. 90 tablet 3  . sildenafil (REVATIO) 20 MG tablet Take 1 tablet (20 mg total) by mouth 3 (three) times daily. 90 tablet 6  . warfarin (COUMADIN) 2 MG tablet TAKE DAILY AS DIRECTED BY COUMADIN CLINIC 130 tablet 1   No current facility-administered medications on file prior to visit.     Review of Systems  Constitutional: Positive for fatigue. Negative for activity change, appetite change and fever.  HENT: Negative for congestion and sore throat.   Eyes: Negative for itching and visual disturbance.  Respiratory: Negative for cough and shortness of breath.   Cardiovascular: Negative for leg swelling.  Gastrointestinal: Negative for abdominal distention, abdominal pain, constipation, diarrhea and nausea.  Endocrine: Negative for cold intolerance and polydipsia.  Genitourinary: Positive for dysuria, frequency, hematuria and urgency. Negative for difficulty urinating and flank pain.  Musculoskeletal: Negative for myalgias.  Skin: Negative for rash.  Allergic/Immunologic: Negative for immunocompromised state.  Neurological: Positive for dizziness. Negative for weakness.       Gets dizzy with utis  No falls   Hematological: Negative for adenopathy.       Objective:   Physical Exam  Constitutional: She appears well-developed and well-nourished. No distress.  Well app  HENT:  Head: Normocephalic and atraumatic.  Eyes: Pupils are equal, round, and reactive to light. Conjunctivae and EOM are normal.  Neck: Normal range of motion. Neck supple.  Cardiovascular: Normal rate, regular rhythm and normal heart sounds.  Pulmonary/Chest: Effort normal and breath sounds normal.    Abdominal: Soft. Bowel sounds are normal. She exhibits no distension. There is tenderness. There is no rebound.  ? Mild R CVA tenderness( this may be baseline back pain however)   Mild suprapubic tenderness  Musculoskeletal: She exhibits no edema.  Lymphadenopathy:    She has no cervical adenopathy.  Neurological: She is alert. Coordination normal.  Skin: No rash noted. No pallor.  Psychiatric: She has a normal mood and affect.  pleasant          Assessment & Plan:   Problem List Items Addressed This Visit      Genitourinary   Recurrent UTI    Very complex urinary hx -rev today in the office  Last uti was e coli- resistant to quinolones / tx and resolved with keflex  Worrisome in light of one kidney and renal insuff Will tx with keflex again for symptoms and pos ua  She will f/u with urology (Dr Erlene Quan) at the end of the mo  Disc ways to prev uti/ enc fluids  Pending urine cx   Meds ordered this encounter  Medications  . cephALEXin (KEFLEX) 250 MG capsule    Sig: Take 1 capsule (250 mg total) by mouth 2 (two) times daily.    Dispense:  14 capsule    Refill:  0   Update if not starting to improve in a week or if worsening        Relevant Medications   cephALEXin (KEFLEX) 250 MG capsule   Other Relevant Orders   Urine Culture     Other   Dysuria - Primary   Relevant Orders   POCT Urinalysis Dipstick (Automated) (Completed)   Urine Culture

## 2017-11-04 NOTE — Patient Instructions (Signed)
Drink water  Empty bladder when you need to  Take keflex as directed  We will alert you when the culture returns    Update if not starting to improve in a week or if worsening    Follow up with urology as planned

## 2017-11-04 NOTE — Assessment & Plan Note (Addendum)
Very complex urinary hx -rev today in the office  Last uti was e coli- resistant to quinolones / tx and resolved with keflex  Worrisome in light of one kidney and renal insuff Will tx with keflex again for symptoms and pos ua  She will f/u with urology (Dr Erlene Quan) at the end of the mo  Disc ways to prev uti/ enc fluids  Pending urine cx   Meds ordered this encounter  Medications  . cephALEXin (KEFLEX) 250 MG capsule    Sig: Take 1 capsule (250 mg total) by mouth 2 (two) times daily.    Dispense:  14 capsule    Refill:  0   Update if not starting to improve in a week or if worsening

## 2017-11-06 LAB — URINE CULTURE
MICRO NUMBER:: 90787385
SPECIMEN QUALITY:: ADEQUATE

## 2017-11-07 ENCOUNTER — Ambulatory Visit: Payer: PPO

## 2017-11-07 DIAGNOSIS — Z95810 Presence of automatic (implantable) cardiac defibrillator: Secondary | ICD-10-CM

## 2017-11-07 DIAGNOSIS — I5032 Chronic diastolic (congestive) heart failure: Secondary | ICD-10-CM

## 2017-11-07 NOTE — Progress Notes (Signed)
EPIC Encounter for ICM Monitoring  Patient Name: [name] is a 84 y.o. female Date: 11/07/2017 Primary Care Physican: Elby Beck, [place] Primary Cardiologist: Rockey Situ Electrophysiologist: Caryl Comes Dry Weight:  183-185 lbs      Heart Failure questions reviewed, pt asymptomatic.  She said she has another UTI.     Thoracic impedance normal.  Prescribed dosage: Furosemide 40 mg take 1 tablet (40 mg total)three times daily and as needed for swelling.Patient self adjusts Furosemide as needed.  Labs: 03/04/2017 Creatinine 1.53, BUN 17, Potassium 4.5, Sodium 142, EGFR 32-37 11/07/2016 Creatinine 1.50, BUN 17, Potassium 4.0, Sodium 139, EGFR 35.45 06/03/2016 Creatinine 1.61, BUN 15, Potassium 4.6, Sodium 141, EGFR 30-35 10/26/2017Creatinine 1.63, BUN 19, Potassium 3.5, Sodium 139, EGFR 29-33  03/19/2016 Creatinine 1.59, BUN 20, Potassium 4.1, Sodium 141  02/12/2016 Creatinine 1.70, BUN 20, Potassium 3.8, Sodium 139  12/26/2015 Creatinine 1.54, BUN 17, Potassium 4.0, Sodium 139, EGFR 31-36  07/27/2015 Creatinine 1.30, BUN 14, Potassium 4.9, Sodium 143, EGFR 39-45  Recommendations: No changes.    Encouraged to call for fluid symptoms.  Follow-up plan: ICM clinic phone appointment on 12/08/2017.    Appointments: Office appointment scheduled 01/13/2018 with Dr. Caryl Comes.  Copy of ICM check sent to Dr. Caryl Comes.   3 month ICM trend: 11/07/2017    AT/AF   1 Year ICM trend:       Rosalene Billings, RN 11/07/2017 2:28 PM

## 2017-11-13 ENCOUNTER — Ambulatory Visit (INDEPENDENT_AMBULATORY_CARE_PROVIDER_SITE_OTHER): Payer: PPO | Admitting: General Practice

## 2017-11-13 DIAGNOSIS — E538 Deficiency of other specified B group vitamins: Secondary | ICD-10-CM | POA: Diagnosis not present

## 2017-11-13 DIAGNOSIS — Z7902 Long term (current) use of antithrombotics/antiplatelets: Secondary | ICD-10-CM

## 2017-11-13 DIAGNOSIS — Z7901 Long term (current) use of anticoagulants: Secondary | ICD-10-CM | POA: Diagnosis not present

## 2017-11-13 LAB — POCT INR: INR: 2.4 (ref 2.0–3.0)

## 2017-11-13 MED ORDER — CYANOCOBALAMIN 1000 MCG/ML IJ SOLN
1000.0000 ug | Freq: Once | INTRAMUSCULAR | Status: AC
  Administered 2017-11-13: 1000 ug via INTRAMUSCULAR

## 2017-11-13 NOTE — Patient Instructions (Addendum)
Pre visit review using our clinic review tool, if applicable. No additional management support is needed unless otherwise documented below in the visit note.  Take 1 tablet daily except 1 1/2 tablets on Sunday/Tuesdays and Saturdays.  Re-check in 4 weeks.

## 2017-11-19 ENCOUNTER — Other Ambulatory Visit: Payer: Self-pay | Admitting: Family Medicine

## 2017-11-19 DIAGNOSIS — Z1231 Encounter for screening mammogram for malignant neoplasm of breast: Secondary | ICD-10-CM

## 2017-11-24 ENCOUNTER — Ambulatory Visit (INDEPENDENT_AMBULATORY_CARE_PROVIDER_SITE_OTHER): Payer: PPO | Admitting: *Deleted

## 2017-11-24 DIAGNOSIS — I428 Other cardiomyopathies: Secondary | ICD-10-CM | POA: Diagnosis not present

## 2017-11-24 NOTE — Progress Notes (Signed)
Remote ICD transmission.   

## 2017-11-25 ENCOUNTER — Encounter: Payer: Self-pay | Admitting: Cardiology

## 2017-11-28 ENCOUNTER — Ambulatory Visit: Payer: PPO | Admitting: Urology

## 2017-12-08 ENCOUNTER — Ambulatory Visit: Payer: Self-pay | Admitting: Diagnostic Neuroimaging

## 2017-12-08 ENCOUNTER — Ambulatory Visit (INDEPENDENT_AMBULATORY_CARE_PROVIDER_SITE_OTHER): Payer: PPO | Admitting: Family Medicine

## 2017-12-08 ENCOUNTER — Encounter: Payer: Self-pay | Admitting: Family Medicine

## 2017-12-08 ENCOUNTER — Ambulatory Visit (INDEPENDENT_AMBULATORY_CARE_PROVIDER_SITE_OTHER): Payer: PPO

## 2017-12-08 VITALS — BP 134/68 | HR 72 | Temp 97.8°F | Ht 66.5 in | Wt 188.8 lb

## 2017-12-08 DIAGNOSIS — N390 Urinary tract infection, site not specified: Secondary | ICD-10-CM

## 2017-12-08 DIAGNOSIS — Z7901 Long term (current) use of anticoagulants: Secondary | ICD-10-CM | POA: Diagnosis not present

## 2017-12-08 DIAGNOSIS — Z905 Acquired absence of kidney: Secondary | ICD-10-CM

## 2017-12-08 DIAGNOSIS — Z95810 Presence of automatic (implantable) cardiac defibrillator: Secondary | ICD-10-CM | POA: Diagnosis not present

## 2017-12-08 DIAGNOSIS — I5032 Chronic diastolic (congestive) heart failure: Secondary | ICD-10-CM

## 2017-12-08 DIAGNOSIS — R300 Dysuria: Secondary | ICD-10-CM

## 2017-12-08 LAB — POC URINALSYSI DIPSTICK (AUTOMATED)
Bilirubin, UA: NEGATIVE
Glucose, UA: NEGATIVE
Ketones, UA: NEGATIVE
Nitrite, UA: NEGATIVE
Protein, UA: NEGATIVE
Spec Grav, UA: 1.01 (ref 1.010–1.025)
Urobilinogen, UA: 0.2 E.U./dL
pH, UA: 7 (ref 5.0–8.0)

## 2017-12-08 MED ORDER — CEPHALEXIN 250 MG PO CAPS: 250.0000 mg | ORAL_CAPSULE | Freq: Two times a day (BID) | ORAL | 0 refills | Status: DC

## 2017-12-08 NOTE — Progress Notes (Signed)
EPIC Encounter for ICM Monitoring  Patient Name: [name] is a 84 y.o. female Date: 12/08/2017 Primary Care Physican: Elby Beck, [place] Primary Cardiologist: Rockey Situ Electrophysiologist: Faustino Congress Weight:Previous [phone] lbs  Clinical Status (24-Nov-2017 to 08-Dec-2017)  Treated VT/VF 0 episodes  AT/AF 1 episode  Time in AT/AF 4.2 hr/day (17.4%)  Observations (1) (24-Nov-2017 to 08-Dec-2017)  AT/AF >= 6 hr for 3 days.      Attempted call to patient and unable to reach.   Transmission reviewed.    Thoracic impedance abnormal suggesting fluid accumulation starting 12/03/2017.  Prescribed dosage: Furosemide 40 mg take 1 tablet (40 mg total)three times daily and as needed for swelling.Patient self adjusts Furosemide as needed.  Labs: 03/04/2017 Creatinine 1.53, BUN 17, Potassium 4.5, Sodium 142, EGFR 32-37 11/07/2016 Creatinine 1.50, BUN 17, Potassium 4.0, Sodium 139, EGFR 35.45 06/03/2016 Creatinine 1.61, BUN 15, Potassium 4.6, Sodium 141, EGFR 30-35  Recommendations: NONE - Unable to reach.  Follow-up plan: ICM clinic phone appointment on 12/16/2017 to recheck fluid levels. Office appointment scheduled 01/13/2018 with Dr. Caryl Comes.    Copy of ICM check sent to Dr. Caryl Comes and Dr Rockey Situ.   3 month ICM trend: 12/08/2017    AT/AF     1 Year ICM trend:       Rosalene Billings, RN 12/08/2017 1:04 PM

## 2017-12-08 NOTE — Assessment & Plan Note (Signed)
Complicated urinary history.  Again concern for recurrent UTI in h/o same - treat with keflex 250mg  bid x 1 wk.  Encouraged keep f/u with urology next month.  UCx sent today.

## 2017-12-08 NOTE — Assessment & Plan Note (Signed)
Limits treatment options.

## 2017-12-08 NOTE — Patient Instructions (Signed)
Concern for recurrent infection - start keflex 250mg  for a week. Urine culture sent. Let us know if ongoing symptoms despite treatment. Keep appointment with Dr Erlene Quan.

## 2017-12-08 NOTE — Assessment & Plan Note (Signed)
H/o R RCC s/p nephrectomy 2003

## 2017-12-08 NOTE — Progress Notes (Signed)
BP 134/68 (BP Location: Left Arm, Patient Position: Sitting, Cuff Size: Large)   Pulse 72   Temp 97.8 F (36.6 C) (Oral)   Ht 5' 6.5" (1.689 m)   Wt 188 lb 12 oz (85.6 kg)   SpO2 97%   BMI 30.01 kg/m    CC: UTI? Subjective:    Patient ID: [name], female    DOB: 12/06/1933, 84 y.o.   MRN: [id number]  HPI: [name] is a 84 y.o. female presenting on 12/08/2017 for Back Pain (C/o low back pain, foul urine odor and has seen blood on tissue. ) and Dysuria (C/o burning with and after urination. Sxs started last week. H/o UTIs.)   1 wk h/o left lower back pain, smell to urine, blood on tissue when wiping, dysuria, frequency and urgency. Occasional chills.  No fevers, flank pain, nausea.  Tried to see Dr Erlene Quan - appt not until Sept 2019.   Had UCx confirmed E coli UTI 09/2017 treated with keflex Had UCx confirmed E coli UTI 11/2017 treated with keflex  Sees urology Erlene Quan) for recurrent UTIs - has not seen recently. She has atrophic vaginitis on estrace cream. Last cystoscopy 06/2017 WNL. Prior tmp for ppx not tolerated (itching). H/o R nephrectomy for renal cancer.   On coumadin for h/o afib.   Relevant past medical, surgical, family and social history reviewed and updated as indicated. Interim medical history since our last visit reviewed. Allergies and medications reviewed and updated. Outpatient Medications Prior to Visit  Medication Sig Dispense Refill  . acetaminophen (TYLENOL) 500 MG tablet Take 1,000 mg by mouth every 6 (six) hours as needed for headache.    . carvedilol (COREG) 12.5 MG tablet TAKE 1 TABLET BY MOUTH TWICE (2) DAILY WITH A MEAL 180 tablet 3  . cetirizine (ZYRTEC) 10 MG tablet Take 10 mg by mouth daily.     . disopyramide (NORPACE) 150 MG capsule TAKE 1 CAPSULE BY MOUTH TWICE DAILY 60 capsule 9  . ezetimibe (ZETIA) 10 MG tablet Take 1 tablet (10 mg total) by mouth daily. 30 tablet 11  . furosemide (LASIX) 40 MG tablet Take 1 tablet (40 mg  total) by mouth as directed. Three times daily and as needed for swelling. 120 tablet 3  . metolazone (ZAROXOLYN) 2.5 MG tablet Take 1 tablet (2.5mg  total) 30 minutes prior to morning dose of Furosemide. Do not take any further doses unless directed by physician. 3 tablet 0  . rosuvastatin (CRESTOR) 40 MG tablet Take 1 tablet (40 mg total) by mouth daily. 90 tablet 3  . sildenafil (REVATIO) 20 MG tablet Take 1 tablet (20 mg total) by mouth 3 (three) times daily. 90 tablet 6  . warfarin (COUMADIN) 2 MG tablet TAKE DAILY AS DIRECTED BY COUMADIN CLINIC 130 tablet 1  . cephALEXin (KEFLEX) 250 MG capsule Take 1 capsule (250 mg total) by mouth 2 (two) times daily. 14 capsule 0   No facility-administered medications prior to visit.      Per HPI unless specifically indicated in ROS section below Review of Systems     Objective:    BP 134/68 (BP Location: Left Arm, Patient Position: Sitting, Cuff Size: Large)   Pulse 72   Temp 97.8 F (36.6 C) (Oral)   Ht 5' 6.5" (1.689 m)   Wt 188 lb 12 oz (85.6 kg)   SpO2 97%   BMI 30.01 kg/m   Wt Readings from Last 3 Encounters:  12/08/17 188 lb 12 oz (85.6 kg)  11/04/17 182 lb 8 oz (82.8 kg)  10/07/17 185 lb 4 oz (84 kg)    Physical Exam  Constitutional: She appears well-developed and well-nourished. No distress.  Abdominal: Soft. Normal appearance and bowel sounds are normal. She exhibits no distension and no mass. There is no hepatosplenomegaly. There is tenderness (mild) in the suprapubic area and left lower quadrant. There is no rebound, no guarding, no CVA tenderness and negative Murphy's sign. No hernia.  Nursing note and vitals reviewed.  Results for orders placed or performed in visit on 12/08/17  POCT Urinalysis Dipstick (Automated)  Result Value Ref Range   Color, UA yellow    Clarity, UA cloudy    Glucose, UA Negative Negative   Bilirubin, UA negative    Ketones, UA negative    Spec Grav, UA 1.010 1.010 - 1.025   Blood, UA 1+    pH,  UA 7.0 5.0 - 8.0   Protein, UA Negative Negative   Urobilinogen, UA 0.2 0.2 or 1.0 E.U./dL   Nitrite, UA negative    Leukocytes, UA Large (3+) (A) Negative   Lab Results  Component Value Date   CREATININE 1.42 (H) 09/18/2017   BUN 21 09/18/2017   NA 138 09/18/2017   K 4.2 09/18/2017   CL 100 09/18/2017   CO2 31 09/18/2017   GFR = 37    Assessment & Plan:   Problem List Items Addressed This Visit    S/p nephrectomy    H/o R RCC s/p nephrectomy 2003      Recurrent UTI - Primary    Complicated urinary history.  Again concern for recurrent UTI in h/o same - treat with keflex 250mg  bid x 1 wk.  Encouraged keep f/u with urology next month.  UCx sent today.       Relevant Medications   cephALEXin (KEFLEX) 250 MG capsule   Other Relevant Orders   Urine Culture   Long term (current) use of anticoagulants    Limits treatment options.       Dysuria   Relevant Orders   POCT Urinalysis Dipstick (Automated) (Completed)   Urine Culture       Meds ordered this encounter  Medications  . cephALEXin (KEFLEX) 250 MG capsule    Sig: Take 1 capsule (250 mg total) by mouth 2 (two) times daily.    Dispense:  14 capsule    Refill:  0   Orders Placed This Encounter  Procedures  . Urine Culture  . POCT Urinalysis Dipstick (Automated)    Follow up plan: Return if symptoms worsen or fail to improve.  Ria Bush, MD

## 2017-12-09 ENCOUNTER — Telehealth: Payer: Self-pay

## 2017-12-09 NOTE — Telephone Encounter (Signed)
Remote ICM transmission received.  Attempted call to patient and line was busy

## 2017-12-10 LAB — URINE CULTURE
MICRO NUMBER:: 90922899
SPECIMEN QUALITY:: ADEQUATE

## 2017-12-11 ENCOUNTER — Ambulatory Visit (INDEPENDENT_AMBULATORY_CARE_PROVIDER_SITE_OTHER): Payer: PPO | Admitting: General Practice

## 2017-12-11 DIAGNOSIS — Z7901 Long term (current) use of anticoagulants: Secondary | ICD-10-CM

## 2017-12-11 DIAGNOSIS — Z7902 Long term (current) use of antithrombotics/antiplatelets: Secondary | ICD-10-CM

## 2017-12-11 DIAGNOSIS — E538 Deficiency of other specified B group vitamins: Secondary | ICD-10-CM | POA: Diagnosis not present

## 2017-12-11 LAB — POCT INR: INR: 2.8 (ref 2.0–3.0)

## 2017-12-11 MED ORDER — CYANOCOBALAMIN 1000 MCG/ML IJ SOLN
1000.0000 ug | Freq: Once | INTRAMUSCULAR | Status: AC
  Administered 2017-12-11: 1000 ug via INTRAMUSCULAR

## 2017-12-11 NOTE — Patient Instructions (Addendum)
Pre visit review using our clinic review tool, if applicable. No additional management support is needed unless otherwise documented below in the visit note.  Take 1 tablet daily except 1 1/2 tablets on Sunday/Tuesdays and Saturdays.  Re-check in 4 weeks.

## 2017-12-15 ENCOUNTER — Ambulatory Visit
Admission: RE | Admit: 2017-12-15 | Discharge: 2017-12-15 | Disposition: A | Discharge status: Home / Self Care | Payer: PPO | Source: Ambulatory Visit | Attending: Family Medicine | Admitting: Family Medicine

## 2017-12-15 DIAGNOSIS — Z1231 Encounter for screening mammogram for malignant neoplasm of breast: Secondary | ICD-10-CM | POA: Diagnosis not present

## 2017-12-16 ENCOUNTER — Ambulatory Visit (INDEPENDENT_AMBULATORY_CARE_PROVIDER_SITE_OTHER): Payer: PPO

## 2017-12-16 ENCOUNTER — Telehealth: Payer: Self-pay | Admitting: Cardiology

## 2017-12-16 DIAGNOSIS — I5032 Chronic diastolic (congestive) heart failure: Secondary | ICD-10-CM

## 2017-12-16 DIAGNOSIS — Z95810 Presence of automatic (implantable) cardiac defibrillator: Secondary | ICD-10-CM

## 2017-12-16 NOTE — Telephone Encounter (Signed)
Spoke with pt and reminded pt of remote transmission that is due today. Pt verbalized understanding.   

## 2017-12-19 LAB — CUP PACEART REMOTE DEVICE CHECK
Battery Remaining Longevity: 74 mo
Battery Voltage: 2.98 V
Brady Statistic AP VP Percent: 2.87 %
Brady Statistic AP VS Percent: 0.06 %
Brady Statistic AS VP Percent: 88.7 %
Brady Statistic AS VS Percent: 8.37 %
Brady Statistic RA Percent Paced: 2.67 %
Brady Statistic RV Percent Paced: 91.67 %
Date Time Interrogation Session: 20190722062604
HighPow Impedance: 42 Ohm
HighPow Impedance: 54 Ohm
Implantable Lead Implant Date: 20061026
Implantable Lead Implant Date: 20111230
Implantable Lead Location: 753859
Implantable Lead Location: 753860
Implantable Lead Model: 5076
Implantable Lead Model: 6947
Implantable Pulse Generator Implant Date: 20171018
Lead Channel Impedance Value: 399 Ohm
Lead Channel Impedance Value: 418 Ohm
Lead Channel Impedance Value: 456 Ohm
Lead Channel Pacing Threshold Amplitude: 0.75 V
Lead Channel Pacing Threshold Pulse Width: 0.4 ms
Lead Channel Sensing Intrinsic Amplitude: 0.75 mV
Lead Channel Sensing Intrinsic Amplitude: 0.75 mV
Lead Channel Sensing Intrinsic Amplitude: 11.375 mV
Lead Channel Sensing Intrinsic Amplitude: 11.375 mV
Lead Channel Setting Pacing Amplitude: 2.5 V
Lead Channel Setting Pacing Amplitude: 2.5 V
Lead Channel Setting Pacing Pulse Width: 0.4 ms
Lead Channel Setting Sensing Sensitivity: 0.3 mV

## 2017-12-19 NOTE — Progress Notes (Signed)
Call to patient and advised symptoms were discussed with Dr Rockey Situ.  He recommended she cut Carvedilol 12.5 mg dosage in half which makes the dosage 6.25 mg.  Take 1/2 tablet twice a day x 2-3 days. Recommended she take BP daily and call back by no later than Tuesday 8/20 with information if symptoms and BP have improved.

## 2017-12-19 NOTE — Progress Notes (Signed)
EPIC Encounter for ICM Monitoring  Patient Name: [name] is a 84 y.o. female Date: 12/19/2017 Primary Care Physican: Elby Beck, [place] Primary Cardiologist: Rockey Situ Electrophysiologist: Faustino Congress Weight:183-185 lbs   Clinical Status (08-Dec-2017 to 17-Dec-2017)  Treated VT/VF 0 episodes   AT/AF 1 episode   Time in AT/AF 5.9 hr/day (24.5%)  Observations (1) (08-Dec-2017 to 17-Dec-2017)  AT/AF >= 6 hr for 2 days.     Heart Failure questions reviewed, pt denies fluid symptoms. She c/o ongoing dizziness but was worse yesterday.    BP readings for 8/15 were 89/42,  94/42,  96/42 sitting and standing was 117/52, 107/47.  She is asking if she should cut down the dosage of Carvedilol. Advised not to adjust any medication dosages until Dr Rockey Situ and Dr Caryl Comes review information and make a recommendation if needed.    Advised it appears she may have episode of AT/AF in the last 2-3 days which can cause the dizziness, lightheadedness and shortness of breath.    Thoracic impedance returned to normal since last remote transmission on 12/08/2017.  Prescribed dosage: Furosemide 40 mg take 1 tablet (40 mg total)three times daily and as needed for swelling.Patient self adjusts Furosemide as needed.  Labs: 03/04/2017 Creatinine 1.53, BUN 17, Potassium 4.5, Sodium 142, EGFR 32-37 11/07/2016 Creatinine 1.50, BUN 17, Potassium 4.0, Sodium 139, EGFR 35.45 06/03/2016 Creatinine 1.61, BUN 15, Potassium 4.6, Sodium 141, EGFR 30-35  Recommendations:  Will discuss with Dr Rockey Situ in office.  Follow-up plan: ICM clinic phone appointment on 02/13/2018.   Office appointment scheduled 01/13/2018 with Dr. Caryl Comes.    Copy of ICM check sent to Dr. Caryl Comes and Dr Rockey Situ for review and advise if patient needs to adjust medication due to dizziness.   AT/AF    3 month ICM trend: 12/15/2017    1 Year ICM trend:       Rosalene Billings, RN 12/19/2017 11:15 AM

## 2017-12-23 ENCOUNTER — Telehealth: Payer: Self-pay

## 2017-12-23 MED ORDER — CARVEDILOL 6.25 MG PO TABS: 6.2500 mg | ORAL_TABLET | Freq: Two times a day (BID) | ORAL | 3 refills | Status: DC

## 2017-12-23 NOTE — Telephone Encounter (Signed)
Call to patient.  Advised confirmed with Dr Rockey Situ she should continue Carvedilol 6.25 mg bid since symptoms of dizziness have improved as well as BP systolic readings are now >104.  Advised to continue to record BP readings sitting and standing and if pressures < 100 to call back.  She verbalized understanding.  Escript for Carvedilol 6.25 mg 1 tablet bid sent with no print at this time since patient just picked up new script of 12.5 mg tablets.  Advised she will need to cut 12.5 mg tablets in half to = 6.25 mg.  She understands.

## 2017-12-23 NOTE — Telephone Encounter (Signed)
BP follow up call to patient.  She confirmed she decreased Carvedilol 12.5 mg to 0.5 tablet (6.25 mg total) bid as instructed by Dr Rockey Situ due to dizziness and low BP readings.  She is feeling much better.  Her highest BP 141/71 but all the rest sitting BP readings ranged from 120-115/54-56 and 104/63 standing today while talking with patient.  Dizziness improved and she feels less tired and lethargic.  Advised would provide Dr Rockey Situ with update and will call her back to confirm she should continue with Carvedilol 6.25 mg bid.

## 2017-12-26 ENCOUNTER — Other Ambulatory Visit: Payer: Self-pay | Admitting: Family Medicine

## 2017-12-29 ENCOUNTER — Other Ambulatory Visit: Payer: Self-pay | Admitting: General Practice

## 2017-12-29 MED ORDER — WARFARIN SODIUM 2 MG PO TABS: ORAL_TABLET | ORAL | 1 refills | Status: DC

## 2017-12-29 NOTE — Telephone Encounter (Signed)
Please advise 

## 2017-12-31 ENCOUNTER — Ambulatory Visit (INDEPENDENT_AMBULATORY_CARE_PROVIDER_SITE_OTHER): Payer: PPO | Admitting: Primary Care

## 2017-12-31 ENCOUNTER — Encounter: Payer: Self-pay | Admitting: Primary Care

## 2017-12-31 ENCOUNTER — Ambulatory Visit: Payer: PPO | Admitting: Primary Care

## 2017-12-31 VITALS — BP 130/80 | HR 86 | Temp 98.2°F | Ht 66.5 in | Wt 185.8 lb

## 2017-12-31 DIAGNOSIS — R350 Frequency of micturition: Secondary | ICD-10-CM

## 2017-12-31 DIAGNOSIS — N390 Urinary tract infection, site not specified: Secondary | ICD-10-CM

## 2017-12-31 LAB — POC URINALSYSI DIPSTICK (AUTOMATED)
Bilirubin, UA: NEGATIVE
Glucose, UA: NEGATIVE
Ketones, UA: NEGATIVE
Nitrite, UA: NEGATIVE
Protein, UA: NEGATIVE
Spec Grav, UA: 1.01 (ref 1.010–1.025)
Urobilinogen, UA: 0.2 E.U./dL
pH, UA: 6.5 (ref 5.0–8.0)

## 2017-12-31 MED ORDER — CEPHALEXIN 250 MG PO CAPS: 250.0000 mg | ORAL_CAPSULE | Freq: Two times a day (BID) | ORAL | 0 refills | Status: DC

## 2017-12-31 NOTE — Assessment & Plan Note (Addendum)
UA with 2+ leuks, trace blood, negative nitrites. Rx for Keflex course sent to pharmacy, 10 days rather than 7. Also encouraged her to start using Estrace again as her last Urology note states that she would benefit from use. She declines a prescription as this was cost prohibitive, she will call the urology office for samples.  She will follow up with urology as scheduled.   Update: Culture sent and confirmed for E coli which is sensitive to Keflex.

## 2017-12-31 NOTE — Patient Instructions (Signed)
Start Cephalexin antibiotics for the infection. Take 1 capsule by mouth twice daily for 10 days.  Call the Urology office to inquire about estrace (vaginal) cream samples as discussed.   Make sure to drink enough water.  Resume your cranberry pills daily.  Follow up with the Urologist as scheduled.  It was a pleasure to see you today!

## 2017-12-31 NOTE — Progress Notes (Signed)
Subjective:    Patient ID: [name], female    DOB: October 29, 1933, 84 y.o.   MRN: [id number]  HPI  [name] is an 84 year old female with a history of hypertension, right nephrectomy in 2003 secondary to renal carcinoma, PAD, CHF, recurrent UTI, urge incontinence who presents today with a chief complaint of urinary frequency.  She also reports dysuria, weak urinary stream, pelvic pressure, trace blood, flank pain. She has a history of recurrent UTI with her most recent evaluation and treatment being 12/08/17 with cephalexin. She does follow with Urology and has an appointment scheduled September 24th, 2019.  She did feel slight improvement with treatment in early August with cephalexin but didn't feel as though symptoms resolved.  Positive urine culture confirming E coli UTI on 09/2017, treated with cephalexin. Positive urine culture confirming E coli UTI on 11/2017, treated with cephalexin. Positive urine culture confirming E coli UTI on 12/08/2017 treated with cephalexin. Last culture did not show resistance to cephalexin or any other antibiotic.   She was evaluated last by her Urologist in February 2019 who started her on estrace cream for vaginal atrophy. Vaginal atrophy was also noticed during her exam in August for urinary symptoms. She is no longer using Estrace Cream as she was told by her Urologist to stop as she felt no difference.   Review of Systems  Constitutional: Negative for fever.  Gastrointestinal: Negative for abdominal pain.  Genitourinary: Positive for dysuria and frequency. Negative for vaginal discharge.       Weak urinary stream, pelvic pressure       Past Medical History:  Diagnosis Date  . Adjustment disorder with anxiety   . Anginal pain ([place])   . Arthritis    "some in my hands" (11/11/2012)  . Atrial fibrillation ([place])   . Automatic implantable cardioverter-defibrillator in situ   . Blood in stool   . Carotid artery stenosis 09/2007   60-79%  bilateral (stable)  . Carotid artery stenosis 10/2008   40-59% R 60-79%   . Chest pain, unspecified   . Chronic airway obstruction, not elsewhere classified   . Chronic bronchitis ([place])    "get it some; not q year" (11/11/2012)  . Chronic diastolic CHF (congestive heart failure) ([place]) 06/04/2013  . Chronic kidney disease, unspecified   . Coronary artery disease    non-obstructive by 2006 cath  . Dysuria   . Edema   . Encounter for therapeutic drug monitoring   . Exertional shortness of breath   . Frequent UTI    "get them a couple times/yr" (11/11/2012)  . Hemorrhage of rectum and anus   . High cholesterol   . History of blood transfusion 04/2011   "after hip OR" (11/11/2012)  . Hypertension 05/20/2011  . Hypertr obst cardiomyop   . Hypotension, unspecified    cardiac cath 2006..nonobstructive CAD 30-40s lesions.Marland KitchenETT 1/09 nondiagnostic due to poor HR response..Right Renal Cancer 2003  . Long term (current) use of anticoagulants   . Malignant neoplasm of kidney, except pelvis   . Osteoarthritis of right hip   . Other acute sinusitis   . Other and unspecified coagulation defects   . Other malaise and fatigue   . Other screening mammogram   . PONV (postoperative nausea and vomiting)   . Pre-operative cardiovascular examination   . Renal cancer ([place]) 06/2001   Right  . Secondary cardiomyopathy, unspecified   . Sinus bradycardia   . Special screening for osteoporosis   . Urge incontinence  Social History   Socioeconomic History  . Marital status: Widowed    Spouse name: Not on file  . Number of children: Not on file  . Years of education: Not on file  . Highest education level: Not on file  Occupational History  . Occupation: Retired    Fish farm manager: RETIRED  Social Needs  . Financial resource strain: Not on file  . Food insecurity:    Worry: Not on file    Inability: Not on file  . Transportation needs:    Medical: Not on file    Non-medical: Not on file  Tobacco Use    . Smoking status: Former Smoker    Packs/day: 0.50    Years: 40.00    Pack years: 20.00    Types: Cigarettes    Last attempt to quit: 05/06/2001    Years since quitting: 16.6  . Smokeless tobacco: Never Used  Substance and Sexual Activity  . Alcohol use: No    Alcohol/week: 0.0 standard drinks  . Drug use: No  . Sexual activity: Never  Lifestyle  . Physical activity:    Days per week: Not on file    Minutes per session: Not on file  . Stress: Not on file  Relationships  . Social connections:    Talks on phone: Not on file    Gets together: Not on file    Attends religious service: Not on file    Active member of club or organization: Not on file    Attends meetings of clubs or organizations: Not on file    Relationship status: Not on file  . Intimate partner violence:    Fear of current or ex partner: Not on file    Emotionally abused: Not on file    Physically abused: Not on file    Forced sexual activity: Not on file  Other Topics Concern  . 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   ALCOHOL USE-NO   RETIRED   FORMER TOBACCO USE.....1/2 PACK X 57 YRS UNTIL 2003   Would desire CPR    Past Surgical History:  Procedure Laterality Date  . ABDOMINAL HYSTERECTOMY  1975   for benign causes  . APPENDECTOMY    . BI-VENTRICULAR PACEMAKER UPGRADE  05/04/2010  . CARDIAC CATHETERIZATION  2006  . CATARACT EXTRACTION W/ INTRAOCULAR LENS  IMPLANT, BILATERAL  01/2006-02-2006  . CHOLECYSTECTOMY N/A 11/11/2012   Procedure: LAPAROSCOPIC CHOLECYSTECTOMY WITH INTRAOPERATIVE CHOLANGIOGRAM;  Surgeon: Imogene Burn. Georgette Dover, MD;  Location: [facility];  Service: General;  Laterality: N/A;  . EP IMPLANTABLE DEVICE N/A 02/21/2016   Procedure: ICD Generator Changeout;  Surgeon: Deboraha Sprang, MD;  Location: [facility];  Service:  Cardiovascular;  Laterality: N/A;  . INSERT / REPLACE / REMOVE PACEMAKER  05-01-11   02-28-05-/05-04-10-ICD-MEDTRONIC MAXIMAL DR  . JOINT REPLACEMENT    . LAPAROSCOPIC CHOLECYSTECTOMY  11/11/2012  . LAPAROSCOPIC LYSIS OF ADHESIONS N/A 11/11/2012   Procedure: LAPAROSCOPIC LYSIS OF ADHESIONS;  Surgeon: Imogene Burn. Georgette Dover, MD;  Location: [facility];  Service: General;  Laterality: N/A;  . NEPHRECTOMY Right 06/2001    S/P RENAL CELL CANCER  . TOTAL HIP ARTHROPLASTY Right 05/03/2011   Procedure: TOTAL HIP ARTHROPLASTY ANTERIOR APPROACH;  Surgeon: Mcarthur Rossetti;  Location: [facility];  Service: Orthopedics;  Laterality: Right;  Removal of Cannulated Screws Right Hip, Right Direct Anterior Hip Replacement    Family History  Problem Relation Age of Onset  . Heart failure Mother   . Breast cancer Maternal Aunt 82  . Breast cancer Cousin   . Breast cancer Other   . Colon cancer Neg Hx     Allergies  Allergen Reactions  . Codeine Nausea And Vomiting  . Morphine And Related Nausea And Vomiting  . Sulfonamide Derivatives Other (See Comments)    REACTION: Dry mouth    Current Outpatient Medications on File Prior to Visit  Medication Sig Dispense Refill  . acetaminophen (TYLENOL) 500 MG tablet Take 1,000 mg by mouth every 6 (six) hours as needed for headache.    . carvedilol (COREG) 6.25 MG tablet Take 1 tablet (6.25 mg total) by mouth 2 (two) times daily. 180 tablet 3  . cetirizine (ZYRTEC) 10 MG tablet Take 10 mg by mouth daily.     . disopyramide (NORPACE) 150 MG capsule TAKE 1 CAPSULE BY MOUTH TWICE DAILY 60 capsule 9  . ezetimibe (ZETIA) 10 MG tablet Take 1 tablet (10 mg total) by mouth daily. 30 tablet 11  . furosemide (LASIX) 40 MG tablet Take 1 tablet (40 mg total) by mouth as directed. Three times daily and as needed for swelling. 120 tablet 3  . metolazone (ZAROXOLYN) 2.5 MG tablet Take 1 tablet (2.5mg  total) 30 minutes prior to morning dose of Furosemide. Do not take any further doses  unless directed by physician. 3 tablet 0  . rosuvastatin (CRESTOR) 40 MG tablet Take 1 tablet (40 mg total) by mouth daily. 90 tablet 3  . sildenafil (REVATIO) 20 MG tablet Take 1 tablet (20 mg total) by mouth 3 (three) times daily. 90 tablet 6  . warfarin (COUMADIN) 2 MG tablet Take 1 1/2 tablets daily or AS DIRECTED BY ANTICOAGULATION CLINIC. 130 tablet 1   No current facility-administered medications on file prior to visit.     BP 130/80   Pulse 86   Temp 98.2 F (36.8 C) (Oral)   Ht 5' 6.5" (1.689 m)   Wt 185 lb 12 oz (84.3 kg)   SpO2 96%   BMI 29.53 kg/m    Objective:   Physical Exam  Constitutional: She appears well-nourished.  Neck: Neck supple.  Cardiovascular: Normal rate and regular rhythm.  Respiratory: Effort normal and breath sounds normal.  Genitourinary:  Genitourinary Comments: Declines vaginal exam  Skin: Skin is warm and dry.  Psychiatric: She has a normal mood and affect.           Assessment & Plan:

## 2018-01-01 ENCOUNTER — Encounter: Payer: Self-pay | Admitting: Cardiovascular Disease

## 2018-01-01 ENCOUNTER — Ambulatory Visit (INDEPENDENT_AMBULATORY_CARE_PROVIDER_SITE_OTHER): Payer: PPO | Admitting: Cardiovascular Disease

## 2018-01-01 VITALS — BP 140/60 | HR 71 | Ht 66.5 in | Wt 186.5 lb

## 2018-01-01 DIAGNOSIS — I739 Peripheral vascular disease, unspecified: Secondary | ICD-10-CM | POA: Diagnosis not present

## 2018-01-01 DIAGNOSIS — I779 Disorder of arteries and arterioles, unspecified: Secondary | ICD-10-CM | POA: Diagnosis not present

## 2018-01-01 DIAGNOSIS — I4819 Other persistent atrial fibrillation: Secondary | ICD-10-CM

## 2018-01-01 DIAGNOSIS — I481 Persistent atrial fibrillation: Secondary | ICD-10-CM

## 2018-01-01 DIAGNOSIS — E782 Mixed hyperlipidemia: Secondary | ICD-10-CM

## 2018-01-01 DIAGNOSIS — I10 Essential (primary) hypertension: Secondary | ICD-10-CM

## 2018-01-01 NOTE — Progress Notes (Signed)
Cardiology Office Note   Date:  01/01/2018   ID:  [name], DOB Feb 04, 1934, MRN [id number]  PCP:  Elby Beck, FNP  Cardiologist:  Rockey Situ  Chief Complaint  Patient presents with  . other    Ref by Dr. [name] for evaluation of PAD. Meds reviewed by the pt. verbally. Pt. c/o bilateral leg pain & cramping with walking.       History of Present Illness: [name] is a 84 y.o. female who seems to be self-referred for evaluation and management of possible  peripheral arterial disease. She has known history of nonischemic cardiomyopathy with ICD implanted in 2006 for primary prevention.  She also has persistent atrial fibrillation.  Cardiac catheterization in 2006 showed no evidence of obstructive coronary artery disease.  Ejection fraction normalized on most recent echocardiogram.  She is status post right nephrectomy with underlying chronic kidney disease and most recent creatinine of 1.4.  She is not diabetic and quit smoking 20 years ago.  She also has known moderate left carotid stenosis followed in our office with Doppler. The patient reports being seen by Dr. Delana Meyer in the past for claudication.  At that time medical therapy was recommended given underlying chronic kidney disease.  I do not have these records.  The patient describes pain in both legs around the ankle area and in her calves.  This happens with walking a short distance.  She also complains of prominent veins in her legs.  She also noticed a skin lesion behind the right knee which has been there for few months.  It is few millimeters in diameter.   Past Medical History:  Diagnosis Date  . Adjustment disorder with anxiety   . Anginal pain ([place])   . Arthritis    "some in my hands" (11/11/2012)  . Atrial fibrillation ([place])   . Automatic implantable cardioverter-defibrillator in situ   . Blood in stool   . Carotid artery stenosis 09/2007   60-79% bilateral (stable)  . Carotid artery stenosis  10/2008   40-59% R 60-79%   . Chest pain, unspecified   . Chronic airway obstruction, not elsewhere classified   . Chronic bronchitis ([place])    "get it some; not q year" (11/11/2012)  . Chronic diastolic CHF (congestive heart failure) ([place]) 06/04/2013  . Chronic kidney disease, unspecified   . Coronary artery disease    non-obstructive by 2006 cath  . Dysuria   . Edema   . Encounter for therapeutic drug monitoring   . Exertional shortness of breath   . Frequent UTI    "get them a couple times/yr" (11/11/2012)  . Hemorrhage of rectum and anus   . High cholesterol   . History of blood transfusion 04/2011   "after hip OR" (11/11/2012)  . Hypertension 05/20/2011  . Hypertr obst cardiomyop   . Hypotension, unspecified    cardiac cath 2006..nonobstructive CAD 30-40s lesions.[place] KitchenETT 1/09 nondiagnostic due to poor HR response..Right Renal Cancer 2003  . Long term (current) use of anticoagulants   . Malignant neoplasm of kidney, except pelvis   . Osteoarthritis of right hip   . Other acute sinusitis   . Other and unspecified coagulation defects   . Other malaise and fatigue   . Other screening mammogram   . PONV (postoperative nausea and vomiting)   . Pre-operative cardiovascular examination   . Renal cancer ([place]) 06/2001   Right  . Secondary cardiomyopathy, unspecified   . Sinus bradycardia   . Special screening  for osteoporosis   . Urge incontinence     Past Surgical History:  Procedure Laterality Date  . ABDOMINAL HYSTERECTOMY  1975   for benign causes  . APPENDECTOMY    . BI-VENTRICULAR PACEMAKER UPGRADE  05/04/2010  . CARDIAC CATHETERIZATION  2006  . CATARACT EXTRACTION W/ INTRAOCULAR LENS  IMPLANT, BILATERAL  01/2006-02-2006  . CHOLECYSTECTOMY N/A 11/11/2012   Procedure: LAPAROSCOPIC CHOLECYSTECTOMY WITH INTRAOPERATIVE CHOLANGIOGRAM;  Surgeon: Imogene Burn. Georgette Dover, MD;  Location: [place];  Service: General;  Laterality: N/A;  . EP IMPLANTABLE DEVICE N/A 02/21/2016   Procedure: ICD Generator  Changeout;  Surgeon: Deboraha Sprang, MD;  Location: [facility];  Service: Cardiovascular;  Laterality: N/A;  . INSERT / REPLACE / REMOVE PACEMAKER  05-01-11   02-28-05-/05-04-10-ICD-MEDTRONIC MAXIMAL DR  . JOINT REPLACEMENT    . LAPAROSCOPIC CHOLECYSTECTOMY  11/11/2012  . LAPAROSCOPIC LYSIS OF ADHESIONS N/A 11/11/2012   Procedure: LAPAROSCOPIC LYSIS OF ADHESIONS;  Surgeon: Imogene Burn. Georgette Dover, MD;  Location: [place];  Service: General;  Laterality: N/A;  . NEPHRECTOMY Right 06/2001    S/P RENAL CELL CANCER  . TOTAL HIP ARTHROPLASTY Right 05/03/2011   Procedure: TOTAL HIP ARTHROPLASTY ANTERIOR APPROACH;  Surgeon: Mcarthur Rossetti;  Location: [facility];  Service: Orthopedics;  Laterality: Right;  Removal of Cannulated Screws Right Hip, Right Direct Anterior Hip Replacement     Current Outpatient Medications  Medication Sig Dispense Refill  . acetaminophen (TYLENOL) 500 MG tablet Take 1,000 mg by mouth every 6 (six) hours as needed for headache.    . carvedilol (COREG) 6.25 MG tablet Take 1 tablet (6.25 mg total) by mouth 2 (two) times daily. 180 tablet 3  . cephALEXin (KEFLEX) 250 MG capsule Take 1 capsule (250 mg total) by mouth 2 (two) times daily. 20 capsule 0  . cetirizine (ZYRTEC) 10 MG tablet Take 10 mg by mouth daily.     . disopyramide (NORPACE) 150 MG capsule TAKE 1 CAPSULE BY MOUTH TWICE DAILY 60 capsule 9  . ezetimibe (ZETIA) 10 MG tablet Take 1 tablet (10 mg total) by mouth daily. 30 tablet 11  . furosemide (LASIX) 40 MG tablet Take 1 tablet (40 mg total) by mouth as directed. Three times daily and as needed for swelling. 120 tablet 3  . metolazone (ZAROXOLYN) 2.5 MG tablet Take 1 tablet (2.5mg  total) 30 minutes prior to morning dose of Furosemide. Do not take any further doses unless directed by physician. 3 tablet 0  . rosuvastatin (CRESTOR) 40 MG tablet Take 1 tablet (40 mg total) by mouth daily. 90 tablet 3  . sildenafil (REVATIO) 20 MG tablet Take 1 tablet (20 mg total) by  mouth 3 (three) times daily. 90 tablet 6  . warfarin (COUMADIN) 2 MG tablet Take 1 1/2 tablets daily or AS DIRECTED BY ANTICOAGULATION CLINIC. 130 tablet 1   No current facility-administered medications for this visit.     Allergies:   Codeine; Morphine and related; and Sulfonamide derivatives    Social History:  The patient  reports that she quit smoking about 16 years ago. Her smoking use included cigarettes. She has a 20.00 pack-year smoking history. She has never used smokeless tobacco. She reports that she does not drink alcohol or use drugs.   Family History:  The patient's family history includes Breast cancer in her cousin and other; Breast cancer (age of onset: 4) in her maternal aunt; Heart failure in her mother.    ROS:  Please see the history of present illness.  Otherwise, review of systems are positive for none.   All other systems are reviewed and negative.    PHYSICAL EXAM: VS:  BP 140/60 (BP Location: Right Arm, Patient Position: Sitting, Cuff Size: Normal)   Pulse 71   Ht 5' 6.5" (1.689 m)   Wt 186 lb 8 oz (84.6 kg)   BMI 29.65 kg/m  , BMI Body mass index is 29.65 kg/m. GEN: Well nourished, well developed, in no acute distress  HEENT: normal  Neck: no JVD or masses.  Left carotid bruit. Cardiac: RRR; no murmurs, rubs, or gallops,no edema  Respiratory:  clear to auscultation bilaterally, normal work of breathing GI: soft, nontender, nondistended, + BS MS: no deformity or atrophy  Skin: warm and dry, no rash Neuro:  Strength and sensation are intact Psych: euthymic mood, full affect Vascular: Femoral pulses are slightly diminished bilaterally.  Distal pulses are not palpable.   EKG:  EKG is not ordered today.    Recent Labs: 09/18/2017: BUN 21; Creatinine, Ser 1.42; Hemoglobin 10.6; Platelets 199.0; Potassium 4.2; Sodium 138    Lipid Panel    Component Value Date/Time   CHOL 179 03/19/2016 1123   CHOL 271 (H) 06/09/2014 1317   TRIG 110.0 03/19/2016  1123   TRIG 241 (H) 06/09/2014 1317   HDL 39.10 03/19/2016 1123   HDL 40 06/09/2014 1317   CHOLHDL 5 03/19/2016 1123   VLDL 22.0 03/19/2016 1123   VLDL 48 (H) 06/09/2014 1317   LDLCALC 118 (H) 03/19/2016 1123   LDLCALC 183 (H) 06/09/2014 1317   LDLDIRECT 146.0 11/07/2016 1128      Wt Readings from Last 3 Encounters:  01/01/18 186 lb 8 oz (84.6 kg)  12/31/17 185 lb 12 oz (84.3 kg)  12/08/17 188 lb 12 oz (85.6 kg)       PAD Screen 01/01/2018  Previous PAD dx? No  Previous surgical procedure? No  Pain with walking? Yes  Subsides with rest? Yes  Feet/toe relief with dangling? No  Painful, non-healing ulcers? No  Extremities discolored? No      ASSESSMENT AND PLAN:  1.  Peripheral arterial disease: The patient likely has peripheral arterial disease based on her symptoms and physical exam.  I requested lower extremity arterial Doppler for evaluation.  Further management to be decided based on the results.  We do have to be cautious given underlying chronic kidney disease and previous nephrectomy.  2.  Carotid artery disease: Most recently was 40 to to 59% on the left side.  I agree with testing on an annual basis.  3.  Hyperlipidemia: Currently on rosuvastatin 40 mg daily and Zetia 10 mg daily.  Lipid profile last year showed an LDL of 146.  At that time, Zetia was added but I do not see that she had a follow-up lipid profile.  4.  Atrial fibrillation: Currently in sinus rhythm and on long-term anticoagulation with warfarin.  5.  Essential hypertension: Blood pressure is reasonably controlled.    Disposition:   FU with me based on further testing.  Signed,  Kathlyn Sacramento, MD  01/01/2018 2:21 PM    [place]

## 2018-01-01 NOTE — Patient Instructions (Signed)
Medication Instructions: Your physician recommends that you continue on your current medications as directed. Please refer to the Current Medication list given to you today.  If you need a refill on your cardiac medications before your next appointment, please call your pharmacy.   Procedures/Testing: Your physician has requested that you have a lower extremity arterial duplex. During this test, ultrasound is used to evaluate arterial blood flow in the legs. Allow one hour for this exam. There are no restrictions or special instructions. This will take place at [id number], the [facility] office.  Follow-Up: Your physician will determine follow up after the results of the test.  Thank you for choosing [facility] at [facility]!

## 2018-01-02 LAB — URINE CULTURE
MICRO NUMBER:: 91029934
SPECIMEN QUALITY:: ADEQUATE

## 2018-01-02 NOTE — Addendum Note (Signed)
Addended by: Ricci Barker on: 01/02/2018 02:08 PM   Modules accepted: Orders

## 2018-01-08 ENCOUNTER — Ambulatory Visit: Payer: PPO

## 2018-01-08 ENCOUNTER — Other Ambulatory Visit: Payer: Self-pay | Admitting: Cardiovascular Disease

## 2018-01-08 DIAGNOSIS — I739 Peripheral vascular disease, unspecified: Secondary | ICD-10-CM

## 2018-01-13 ENCOUNTER — Encounter: Payer: Self-pay | Admitting: Internal Medicine

## 2018-01-13 ENCOUNTER — Ambulatory Visit (INDEPENDENT_AMBULATORY_CARE_PROVIDER_SITE_OTHER): Payer: PPO | Admitting: Internal Medicine

## 2018-01-13 VITALS — BP 130/58 | HR 68 | Ht 66.5 in | Wt 188.8 lb

## 2018-01-13 DIAGNOSIS — I495 Sick sinus syndrome: Secondary | ICD-10-CM

## 2018-01-13 DIAGNOSIS — Z95810 Presence of automatic (implantable) cardiac defibrillator: Secondary | ICD-10-CM

## 2018-01-13 DIAGNOSIS — I442 Atrioventricular block, complete: Secondary | ICD-10-CM | POA: Diagnosis not present

## 2018-01-13 DIAGNOSIS — I4819 Other persistent atrial fibrillation: Secondary | ICD-10-CM

## 2018-01-13 DIAGNOSIS — I428 Other cardiomyopathies: Secondary | ICD-10-CM

## 2018-01-13 DIAGNOSIS — I481 Persistent atrial fibrillation: Secondary | ICD-10-CM | POA: Diagnosis not present

## 2018-01-13 DIAGNOSIS — Z79899 Other long term (current) drug therapy: Secondary | ICD-10-CM

## 2018-01-13 NOTE — Patient Instructions (Addendum)
Medication Instructions: - Your physician has recommended you make the following change in your medication:   1) HOLD norpace (disopyramide) for now  Labwork: - none ordered  Procedures/Testing: - none ordered  Follow-Up: - Your physician recommends that you schedule a follow-up appointment in: 3 months with Dr. Caryl Comes.  Any Additional Special Instructions Will Be Listed Below (If Applicable).  - call the office in about 2 weeks to let us know how your dizziness is doing [phone]  If you need a refill on your cardiac medications before your next appointment, please call your pharmacy.

## 2018-01-13 NOTE — Progress Notes (Signed)
Patient Care Team: Elby Beck, FNP as PCP - General (Nurse Practitioner) Minna Merritts, MD as Consulting Physician (Cardiology) Deboraha Sprang, MD as Consulting Physician (Cardiology) Delana Meyer, Dolores Lory, MD as Consulting Physician (Vascular Surgery)   HPI  [name] is a 84 y.o. female Seen in followup for nonischemic cardiomyopathy for which she has a primary prevention ICD implanted initially 2006 with generator replacement about 5 years ago and replacement again 10/17   There had been puffiness and concern re infection;  cultures at change out were neg.  This is much improved    She thinks that she is aware of her atrial fibrillation   She has persistent  atrial fibrillation; previously  treated with disopyramide changed to  flecainide. But 2/2 side effects  back on disopyramide. ( the d/c summary 4/16 was WRONG-describing the use of amiodarone)        Cath 2006 without obstructive disease DATE TEST EF   1/15 Echo  65 % Mod Pulm HTN, TR and Mild MR   4/16 Echo   65 % Mod-sev PulmHTN with RVE, Mod MR severe TR  6/17 Echo  65% Mild-mod MR  12/18 Myoview 65% No ischemia    Anticoagulation w warfarin;  No bleeding issues   Continues to struggle with dizziness.  It is positional, aggravated by standing blood present while seated but not present while lying down.  Vague left-sided chest pains aggravated by movement of her left arm  Dyspnea stable but chronic  No edema    Date Cr K Hgb  12/17 1.63 3.5 9.5  7/18 1.5 4.0 10.4  5/19 1.42 4.2 10.6   Ferritin 7/18 was 19.1  CHADS-VASc score of greater than or equal to 6 (age-76, gender-1, TIA-2 (4/16), heart failure-1)     She has a great grandson [name] to whom she is very attached.  He is a Furniture conservator/restorer and a cross-country running   Past Medical History:  Diagnosis Date  . Adjustment disorder with anxiety   . Anginal pain ([place])   . Arthritis    "some in my hands" (11/11/2012)  . Atrial  fibrillation ([place])   . Automatic implantable cardioverter-defibrillator in situ   . Blood in stool   . Carotid artery stenosis 09/2007   60-79% bilateral (stable)  . Carotid artery stenosis 10/2008   40-59% R 60-79%   . Chest pain, unspecified   . Chronic airway obstruction, not elsewhere classified   . Chronic bronchitis ([place])    "get it some; not q year" (11/11/2012)  . Chronic diastolic CHF (congestive heart failure) ([place]) 06/04/2013  . Chronic kidney disease, unspecified   . Coronary artery disease    non-obstructive by 2006 cath  . Dysuria   . Edema   . Encounter for therapeutic drug monitoring   . Exertional shortness of breath   . Frequent UTI    "get them a couple times/yr" (11/11/2012)  . Hemorrhage of rectum and anus   . High cholesterol   . History of blood transfusion 04/2011   "after hip OR" (11/11/2012)  . Hypertension 05/20/2011  . Hypertr obst cardiomyop   . Hypotension, unspecified    cardiac cath 2006..nonobstructive CAD 30-40s lesions.[place] KitchenETT 1/09 nondiagnostic due to poor HR response..Right Renal Cancer 2003  . Long term (current) use of anticoagulants   . Malignant neoplasm of kidney, except pelvis   . Osteoarthritis of right hip   . Other acute sinusitis   . Other and  unspecified coagulation defects   . Other malaise and fatigue   . Other screening mammogram   . PONV (postoperative nausea and vomiting)   . Pre-operative cardiovascular examination   . Renal cancer ([place]) 06/2001   Right  . Secondary cardiomyopathy, unspecified   . Sinus bradycardia   . Special screening for osteoporosis   . Urge incontinence     Past Surgical History:  Procedure Laterality Date  . ABDOMINAL HYSTERECTOMY  1975   for benign causes  . APPENDECTOMY    . BI-VENTRICULAR PACEMAKER UPGRADE  05/04/2010  . CARDIAC CATHETERIZATION  2006  . CATARACT EXTRACTION W/ INTRAOCULAR LENS  IMPLANT, BILATERAL  01/2006-02-2006  . CHOLECYSTECTOMY N/A 11/11/2012   Procedure: LAPAROSCOPIC  CHOLECYSTECTOMY WITH INTRAOPERATIVE CHOLANGIOGRAM;  Surgeon: Imogene Burn. Georgette Dover, MD;  Location: [place];  Service: General;  Laterality: N/A;  . EP IMPLANTABLE DEVICE N/A 02/21/2016   Procedure: ICD Generator Changeout;  Surgeon: Deboraha Sprang, MD;  Location: [place] [facility];  Service: Cardiovascular;  Laterality: N/A;  . INSERT / REPLACE / REMOVE PACEMAKER  05-01-11   02-28-05-/05-04-10-ICD-MEDTRONIC MAXIMAL DR  . JOINT REPLACEMENT    . LAPAROSCOPIC CHOLECYSTECTOMY  11/11/2012  . LAPAROSCOPIC LYSIS OF ADHESIONS N/A 11/11/2012   Procedure: LAPAROSCOPIC LYSIS OF ADHESIONS;  Surgeon: Imogene Burn. Georgette Dover, MD;  Location: [place];  Service: General;  Laterality: N/A;  . NEPHRECTOMY Right 06/2001    S/P RENAL CELL CANCER  . TOTAL HIP ARTHROPLASTY Right 05/03/2011   Procedure: TOTAL HIP ARTHROPLASTY ANTERIOR APPROACH;  Surgeon: Mcarthur Rossetti;  [facility];  Service: Orthopedics;  Laterality: Right;  Removal of Cannulated Screws Right Hip, Right Direct Anterior Hip Replacement    Current Outpatient Medications  Medication Sig Dispense Refill  . acetaminophen (TYLENOL) 500 MG tablet Take 1,000 mg by mouth every 6 (six) hours as needed for headache.    . carvedilol (COREG) 6.25 MG tablet Take 1 tablet (6.25 mg total) by mouth 2 (two) times daily. 180 tablet 3  . cetirizine (ZYRTEC) 10 MG tablet Take 10 mg by mouth daily.     . disopyramide (NORPACE) 150 MG capsule TAKE 1 CAPSULE BY MOUTH TWICE DAILY 60 capsule 9  . ezetimibe (ZETIA) 10 MG tablet Take 1 tablet (10 mg total) by mouth daily. 30 tablet 11  . furosemide (LASIX) 40 MG tablet Take 1 tablet (40 mg total) by mouth as directed. Three times daily and as needed for swelling. 120 tablet 3  . metolazone (ZAROXOLYN) 2.5 MG tablet Take 1 tablet (2.5mg  total) 30 minutes prior to morning dose of Furosemide. Do not take any further doses unless directed by physician. 3 tablet 0  . rosuvastatin (CRESTOR) 40 MG tablet Take 1 tablet (40 mg total) by  mouth daily. 90 tablet 3  . sildenafil (REVATIO) 20 MG tablet Take 1 tablet (20 mg total) by mouth 3 (three) times daily. 90 tablet 6  . warfarin (COUMADIN) 2 MG tablet Take 1 1/2 tablets daily or AS DIRECTED BY ANTICOAGULATION CLINIC. 130 tablet 1   No current facility-administered medications for this visit.     Allergies  Allergen Reactions  . Codeine Nausea And Vomiting  . Morphine And Related Nausea And Vomiting  . Sulfonamide Derivatives Other (See Comments)    REACTION: Dry mouth    Review of Systems negative except from HPI and PMH  Physical Exam BP (!) 130/58 (BP Location: Left Arm, Patient Position: Sitting, Cuff Size: Normal)   Pulse 68   Ht 5' 6.5" (1.689  m)   Wt 188 lb 12 oz (85.6 kg)   BMI 30.01 kg/m  Well developed and nourished in no acute distress HENT normal Neck supple with JVP-flat Clear Device pocket well healed; without hematoma or erythema.  There is no tethering  Regular rate and rhythm, no murmurs or gallops Abd-soft with active BS No Clubbing cyanosis edema Skin-warm and dry A & Oriented  Grossly normal sensory and motor function    ECG atrial fibrillation/flutter with ventricular pacing 68  Assessment and  Plan  Nonischemic cardiomyopathy with interval normalization of LV function  Sinus node dysfunction device dependent   Complete heart block intermittent  Atrial fibrillation/flutter persistent  High risk medication surveillance-disopyramide  Dizziness   Implantable defibrillator  Continues to have persistent atrial fibrillation/flutter.  I am interpreting this as a failure of disopyramide.  Both of these could be contributing to her dizziness is not directly or by aggravating orthostasis.  We will discontinue the disopyramide.  We will reassess in about 2 weeks time; if her dizziness has abated, we will proceed without further antiarrhythmic therapy.  If it is persisting, we will ascribe it to the atrial fibrillation and treat her  with amiodarone with subsequent cardioversion.  For now, we will continue her carvedilol and her anticoagulation.  On Anticoagulation;  No bleeding issues   We spent more than 50% of our >25 min visit in face to face counseling regarding the above

## 2018-01-15 ENCOUNTER — Ambulatory Visit (INDEPENDENT_AMBULATORY_CARE_PROVIDER_SITE_OTHER): Payer: PPO | Admitting: General Practice

## 2018-01-15 DIAGNOSIS — Z7901 Long term (current) use of anticoagulants: Secondary | ICD-10-CM

## 2018-01-15 DIAGNOSIS — E538 Deficiency of other specified B group vitamins: Secondary | ICD-10-CM | POA: Diagnosis not present

## 2018-01-15 DIAGNOSIS — Z7902 Long term (current) use of antithrombotics/antiplatelets: Secondary | ICD-10-CM | POA: Diagnosis not present

## 2018-01-15 LAB — POCT INR: INR: 2.8 (ref 2.0–3.0)

## 2018-01-15 MED ORDER — CYANOCOBALAMIN 1000 MCG/ML IJ SOLN
1000.0000 ug | Freq: Once | INTRAMUSCULAR | Status: AC
  Administered 2018-01-15: 1000 ug via INTRAMUSCULAR

## 2018-01-15 NOTE — Patient Instructions (Signed)
Pre visit review using our clinic review tool, if applicable. No additional management support is needed unless otherwise documented below in the visit note.  Take 1 tablet daily except 1 1/2 tablets on Sunday/Tuesdays and Saturdays.  Re-check in 4 weeks.

## 2018-01-19 ENCOUNTER — Telehealth: Payer: Self-pay

## 2018-01-19 NOTE — Telephone Encounter (Signed)
Returned call to patient as requested regarding the usage of fitbit tracker.  Advised it is fine to wear fitbit as long as she did not place her wrist with the tracker over her device.  She verbalized understanding.

## 2018-01-20 ENCOUNTER — Ambulatory Visit: Payer: Self-pay | Admitting: Diagnostic Neuroimaging

## 2018-01-27 ENCOUNTER — Ambulatory Visit: Payer: PPO | Admitting: Urology

## 2018-01-27 ENCOUNTER — Encounter: Payer: Self-pay | Admitting: Urology

## 2018-01-27 ENCOUNTER — Telehealth: Payer: Self-pay | Admitting: Internal Medicine

## 2018-01-27 VITALS — BP 138/62 | HR 71 | Ht 66.0 in | Wt 192.0 lb

## 2018-01-27 DIAGNOSIS — N952 Postmenopausal atrophic vaginitis: Secondary | ICD-10-CM | POA: Diagnosis not present

## 2018-01-27 DIAGNOSIS — Z85528 Personal history of other malignant neoplasm of kidney: Secondary | ICD-10-CM | POA: Diagnosis not present

## 2018-01-27 DIAGNOSIS — N390 Urinary tract infection, site not specified: Secondary | ICD-10-CM | POA: Diagnosis not present

## 2018-01-27 LAB — URINALYSIS, COMPLETE
Bilirubin, UA: NEGATIVE
Glucose, UA: NEGATIVE
Ketones, UA: NEGATIVE
Nitrite, UA: NEGATIVE
Protein, UA: NEGATIVE
Specific Gravity, UA: 1.015 (ref 1.005–1.030)
Urobilinogen, Ur: 0.2 mg/dL (ref 0.2–1.0)
pH, UA: 6.5 (ref 5.0–7.5)

## 2018-01-27 LAB — MICROSCOPIC EXAMINATION

## 2018-01-27 NOTE — Patient Instructions (Signed)
Cranberry tablets twice daily  Probiotics daily  Estrogen cream Monday, Wednesday and Friday  Repeat UA/ UCx today

## 2018-01-27 NOTE — Progress Notes (Signed)
01/27/2018 2:24 PM   [name] 1933/05/16 [id number]  Referring provider: Elby Beck, [place], [place] [address]  Chief Complaint  Patient presents with  . Recurrent UTI    HPI: 84 year old female with recurrent E. coli urinary tract infections who returns today for routine follow-up.  Earlier this year, she had extensive evaluation including renal ultrasound which showed a solitary kidney which was otherwise normal and a negative cystoscopy.  She has been seen and evaluated on numerous occasions by her primary care office and treated for E. coli urinary tract infection.  She was most recently treated on 12/31/2017.  Today, she continues to be symptomatic.  She notes that she has burning associated with but also chronic external burning unrelated to voiding.  She has had some lower abdominal pain which she felt was related to bladder pain yesterday.  She thinks she has another infection today.  She has not been using topical estrogen cream.  This was cost prohibitive.  She was given samples at the last visit but she is run out.  She recently found an old tube of this and will start using it again.  She has not been taking cranberry tablets.  She has not been taking her daily probiotic.  In the past, we did try suppressive trimethoprim and she had severe itching was unable to tolerate this.  She is followed by Dr. Caryl Comes and Rockey Situ for ongoing cardiac issues.  She has recently taken off meds and advised to call back in 2 weeks.  She will call this afternoon let them know that she is had increased shortness of breath.  Catheterized for specimen today in clinic today.  She does a personal history of kidney cancer status post nephrectomy 2003 without recurrence.   PMH: Past Medical History:  Diagnosis Date  . Adjustment disorder with anxiety   . Anginal pain ([place])   . Arthritis    "some in my hands" (11/11/2012)  . Atrial fibrillation ([place])   .  Automatic implantable cardioverter-defibrillator in situ   . Blood in stool   . Carotid artery stenosis 09/2007   60-79% bilateral (stable)  . Carotid artery stenosis 10/2008   40-59% R 60-79%   . Chest pain, unspecified   . Chronic airway obstruction, not elsewhere classified   . Chronic bronchitis ([facility])    "get it some; not q year" (11/11/2012)  . Chronic diastolic CHF (congestive heart failure) ([place]) 06/04/2013  . Chronic kidney disease, unspecified   . Coronary artery disease    non-obstructive by 2006 cath  . Dysuria   . Edema   . Encounter for therapeutic drug monitoring   . Exertional shortness of breath   . Frequent UTI    "get them a couple times/yr" (11/11/2012)  . Hemorrhage of rectum and anus   . High cholesterol   . History of blood transfusion 04/2011   "after hip OR" (11/11/2012)  . Hypertension 05/20/2011  . Hypertr obst cardiomyop   . Hypotension, unspecified    cardiac cath 2006..nonobstructive CAD 30-40s lesions.Marland KitchenETT 1/09 nondiagnostic due to poor HR response..Right Renal Cancer 2003  . Long term (current) use of anticoagulants   . Malignant neoplasm of kidney, except pelvis   . Osteoarthritis of right hip   . Other acute sinusitis   . Other and unspecified coagulation defects   . Other malaise and fatigue   . Other screening mammogram   . PONV (postoperative nausea and vomiting)   .  Pre-operative cardiovascular examination   . Renal cancer ([place]) 06/2001   Right  . Secondary cardiomyopathy, unspecified   . Sinus bradycardia   . Special screening for osteoporosis   . Urge incontinence     Surgical History: Past Surgical History:  Procedure Laterality Date  . ABDOMINAL HYSTERECTOMY  1975   for benign causes  . APPENDECTOMY    . BI-VENTRICULAR PACEMAKER UPGRADE  05/04/2010  . CARDIAC CATHETERIZATION  2006  . CATARACT EXTRACTION W/ INTRAOCULAR LENS  IMPLANT, BILATERAL  01/2006-02-2006  . CHOLECYSTECTOMY N/A 11/11/2012   Procedure: LAPAROSCOPIC CHOLECYSTECTOMY  WITH INTRAOPERATIVE CHOLANGIOGRAM;  Surgeon: Imogene Burn. Georgette Dover, MD;  Location: [facility];  Service: General;  Laterality: N/A;  . EP IMPLANTABLE DEVICE N/A 02/21/2016   Procedure: ICD Generator Changeout;  Surgeon: Deboraha Sprang, MD;  Location: [facility];  Service: Cardiovascular;  Laterality: N/A;  . INSERT / REPLACE / REMOVE PACEMAKER  05-01-11   02-28-05-/05-04-10-ICD-MEDTRONIC MAXIMAL DR  . JOINT REPLACEMENT    . LAPAROSCOPIC CHOLECYSTECTOMY  11/11/2012  . LAPAROSCOPIC LYSIS OF ADHESIONS N/A 11/11/2012   Procedure: LAPAROSCOPIC LYSIS OF ADHESIONS;  Surgeon: Imogene Burn. Georgette Dover, MD;  Location: [facility];  Service: General;  Laterality: N/A;  . NEPHRECTOMY Right 06/2001    S/P RENAL CELL CANCER  . TOTAL HIP ARTHROPLASTY Right 05/03/2011   Procedure: TOTAL HIP ARTHROPLASTY ANTERIOR APPROACH;  Surgeon: Mcarthur Rossetti;  Location: [facility];  Service: Orthopedics;  Laterality: Right;  Removal of Cannulated Screws Right Hip, Right Direct Anterior Hip Replacement    Home Medications:  Allergies as of 01/27/2018      Reactions   Codeine Nausea And Vomiting   Morphine And Related Nausea And Vomiting   Sulfonamide Derivatives Other (See Comments)   REACTION: Dry mouth      Medication List        Accurate as of 01/27/18  2:24 PM. Always use your most recent med list.          acetaminophen 500 MG tablet Commonly known as:  TYLENOL Take 1,000 mg by mouth every 6 (six) hours as needed for headache.   carvedilol 6.25 MG tablet Commonly known as:  COREG Take 1 tablet (6.25 mg total) by mouth 2 (two) times daily.   cetirizine 10 MG tablet Commonly known as:  ZYRTEC Take 10 mg by mouth daily.   disopyramide 150 MG capsule Commonly known as:  NORPACE TAKE 1 CAPSULE BY MOUTH TWICE DAILY   ezetimibe 10 MG tablet Commonly known as:  ZETIA Take 1 tablet (10 mg total) by mouth daily.   furosemide 40 MG tablet Commonly known as:  LASIX Take 1 tablet (40 mg total) by mouth as directed.  Three times daily and as needed for swelling.   metolazone 2.5 MG tablet Commonly known as:  ZAROXOLYN Take 1 tablet (2.5mg  total) 30 minutes prior to morning dose of Furosemide. Do not take any further doses unless directed by physician.   rosuvastatin 40 MG tablet Commonly known as:  CRESTOR Take 1 tablet (40 mg total) by mouth daily.   sildenafil 20 MG tablet Commonly known as:  REVATIO Take 1 tablet (20 mg total) by mouth 3 (three) times daily.   warfarin 2 MG tablet Commonly known as:  COUMADIN Take as directed by the anticoagulation clinic. If you are unsure how to take this medication, talk to your nurse or doctor. Original instructions:  Take 1 1/2 tablets daily or AS DIRECTED BY ANTICOAGULATION CLINIC.  Allergies:  Allergies  Allergen Reactions  . Codeine Nausea And Vomiting  . Morphine And Related Nausea And Vomiting  . Sulfonamide Derivatives Other (See Comments)    REACTION: Dry mouth    Family History: Family History  Problem Relation Age of Onset  . Heart failure Mother   . Breast cancer Maternal Aunt 82  . Breast cancer Cousin   . Breast cancer Other   . Colon cancer Neg Hx     Social History:  reports that she quit smoking about 16 years ago. Her smoking use included cigarettes. She has a 20.00 pack-year smoking history. She has never used smokeless tobacco. She reports that she does not drink alcohol or use drugs.  ROS: UROLOGY Frequent Urination?: Yes Hard to postpone urination?: Yes Burning/pain with urination?: Yes Get up at night to urinate?: Yes Leakage of urine?: Yes Urine stream starts and stops?: Yes Trouble starting stream?: Yes Do you have to strain to urinate?: No Blood in urine?: Yes Urinary tract infection?: No Sexually transmitted disease?: No Injury to kidneys or bladder?: No Painful intercourse?: No Weak stream?: No Currently pregnant?: No Vaginal bleeding?: Yes Last menstrual period?: n  Gastrointestinal Nausea?:  No Vomiting?: No Indigestion/heartburn?: No Diarrhea?: No Constipation?: No  Constitutional Fever: No Night sweats?: No Weight loss?: No Fatigue?: Yes  Skin Skin rash/lesions?: No Itching?: Yes  Eyes Blurred vision?: No Double vision?: No  Ears/Nose/Throat Sore throat?: No Sinus problems?: Yes  Hematologic/Lymphatic Swollen glands?: No Easy bruising?: Yes  Cardiovascular Leg swelling?: Yes Chest pain?: Yes  Respiratory Cough?: No Shortness of breath?: Yes  Endocrine Excessive thirst?: No  Musculoskeletal Back pain?: Yes Joint pain?: No  Neurological Headaches?: No Dizziness?: Yes  Psychologic Depression?: No Anxiety?: No  Physical Exam: BP 138/62   Pulse 71   Ht 5\' 6"  (1.676 m)   Wt 192 lb (87.1 kg)   BMI 30.99 kg/m   Constitutional:  Alert and oriented, No acute distress. HEENT: [place] AT, moist mucus membranes.  Trachea midline, no masses. Cardiovascular: No clubbing, cyanosis, or edema. Respiratory: Normal respiratory effort, no increased work of breathing. Skin: No rashes, bruises or suspicious lesions. Neurologic: Grossly intact, no focal deficits, moving all 4 extremities. Psychiatric: Normal mood and affect.  Laboratory Data: Lab Results  Component Value Date   WBC 3.8 (L) 09/18/2017   HGB 10.6 (L) 09/18/2017   HCT 33.4 (L) 09/18/2017   MCV 72.7 (L) 09/18/2017   PLT 199.0 09/18/2017    Lab Results  Component Value Date   CREATININE 1.42 (H) 09/18/2017   Lab Results  Component Value Date   HGBA1C 6.1 (H) 08/17/2014    Urinalysis Patient provided a voided sample and this is directly compared to a catheterized specimen.  These are both quite similar.  Microscopic exam showed 11-30 white blood cells per high-powered field, 0-2 red blood cells, nitrate negative, moderate bacteria.  Pertinent Imaging: n/a  Assessment & Plan:    1. Recurrent UTI Persistent/recurrent E. coli urinary tract infections Suspect chronic bacterial  colonization Catheterized specimen today sent for culture, will treat as needed Previously did not tolerate suppressive trimethoprim, consider suppressive Macrobid x3 months pending culture and sensitivity data Strongly encourage resumption of topical estrogen cream 3 times a week as previously prescribed, the patient reassured me she would do this and has a tube at home Advised to resume cranberry tablets twice daily Advised to resume daily probiotic Recognitions were written out for the patient on her discharge - Urinalysis, Complete - CULTURE, URINE  COMPREHENSIVE  2. History of kidney cancer Remote history of kidney cancer without recurrence Most recent renal ultrasound without lesions Solitary kidney  3. Atrophic vaginitis As above   Return in about 1 year (around 01/28/2019) for UTI recheck with [facility].  Hollice Espy, MD  [facility] [address], [place],  [address] [phone]

## 2018-01-27 NOTE — Telephone Encounter (Signed)
Patient saw Dr. Caryl Comes on 9/10 Calling to give her 2 week update  Still dizzy and lethagic, has had some very bad days Today she saw Dr. Erlene Quan - waiting to see results to determine if she has a UTI Blood pressure was low ~100/46 Please call to discuss

## 2018-01-28 LAB — CUP PACEART INCLINIC DEVICE CHECK
Brady Statistic AP VP Percent: 34 %
Brady Statistic AP VS Percent: 0.1 % — CL
Brady Statistic AS VP Percent: 60.7 %
Brady Statistic AS VS Percent: 5.2 %
Date Time Interrogation Session: 20190925121039
Implantable Lead Implant Date: 20061026
Implantable Lead Implant Date: 20111230
Implantable Lead Location: 753859
Implantable Lead Location: 753860
Implantable Lead Model: 5076
Implantable Lead Model: 6947
Implantable Pulse Generator Implant Date: 20171018
Lead Channel Impedance Value: 456 Ohm
Lead Channel Impedance Value: 456 Ohm
Lead Channel Pacing Threshold Amplitude: 0.75 V
Lead Channel Pacing Threshold Pulse Width: 0.4 ms
Lead Channel Sensing Intrinsic Amplitude: 0.6 mV
Lead Channel Sensing Intrinsic Amplitude: 11.5 mV

## 2018-01-28 NOTE — Telephone Encounter (Signed)
Persistent AT/AF since August.     V rates during AT/AF:

## 2018-01-28 NOTE — Telephone Encounter (Signed)
To Dr. Klein to review. 

## 2018-01-28 NOTE — Telephone Encounter (Signed)
Reviewed the patient's symptoms and interrogation info with Dr. Caryl Comes. Per Dr. Caryl Comes, ok for the patient to [facility] metolazone 2.5 mg x 1 dose tomorrow morning prior to lasix. He will further review her chart in the morning and formulate a plan going forward.   I have called the patient and notified her of the above information. She states she has a wound on her leg that she is seeing dermatology for tomorrow around 11 am. I have advised her to wait and take metolazone 2.5 mg x 1 dose just prior to her 1 pm dose tomorrow.   She is also aware that we will call her back tomorrow after Dr. Caryl Comes reviews her transmission further. She is agreeable with the above.

## 2018-01-28 NOTE — Telephone Encounter (Signed)
I called and spoke with the patient.  She has been off of norpace x 2 weeks as directed by Dr. Caryl Comes to see if her fatigue improved. She calls now stating that she feels worse than she did prior to stopping the drug.  She reports having had a couple of "really bad days." I asked her what she meant by that and she states that she was really dizzy/ SOB/ having swelling.   She states her baseline weight about 3-4 weeks ago was 182 lbs.  Dr. Caryl Comes saw her on 01/13/18 and she was 188 lbs. She saw Dr. Erlene Quan yesterday and her weight was 192 lbs.   The patient confirms she is on lasix 40 mg- 2 tablets (80 mg) every morning & 1 tablet (40 mg) around 1 pm.  She confirms that she does have 2 tablets of metolazone 2.5 mg tablets left at home.   I have advised the patient to please transmit today so we can reassess her a-fib/ flutter. I will notify the device clinic to please be watching for this. Once received, I will review her transmission and symptoms with Dr. Caryl Comes. The patient is aware that she will most likely need an alternative antiarrhythmic drug and possible DCCV.  She voices understanding of the above and is aware I will call her back later today.

## 2018-01-29 DIAGNOSIS — D0471 Carcinoma in situ of skin of right lower limb, including hip: Secondary | ICD-10-CM | POA: Diagnosis not present

## 2018-01-29 DIAGNOSIS — D485 Neoplasm of uncertain behavior of skin: Secondary | ICD-10-CM | POA: Diagnosis not present

## 2018-01-29 DIAGNOSIS — L720 Epidermal cyst: Secondary | ICD-10-CM | POA: Diagnosis not present

## 2018-01-29 NOTE — Telephone Encounter (Signed)
Would like to begin her on amiod for afib 400 bid x 2  200 bid x2

## 2018-01-30 MED ORDER — AMIODARONE HCL 400 MG PO TABS: ORAL_TABLET | ORAL | 0 refills | Status: DC

## 2018-01-30 NOTE — Telephone Encounter (Signed)
I called and spoke with the patient this morning and advised her of Dr. Olin Pia recommendations to start amiodarone:  Amiodarone 400 mg BID x 2 weeks, then Amiodarone 200 mg BID x 2 weeks.  She is aware that Dr. Caryl Comes will want to see her back in the office in about 3 weeks. She will come in to see him on Tuesday 02/17/18 @ 9:15 am.  The patient is aware that I will send in a 2 week supply of amiodarone 400 mg tablets to the pharmacy and that I will send the 200 mg tablets in ~ 10/10.  I have advised her that the medication alone may not get her back in rhythm and if it doesn't she will most likely require DCCV. She did take metolazone 2.5 mg once yesterday afternoon and urinated fairly well up until 9:00 pm last night. She has not weighed this morning as of when I spoke with her as she saw the dermatologist yesterday and they biopsied a place on her leg. This is quite sore this morning. I have advised her to continue to monitor her weights and call the office for weight gain of 3 lbs/ more in 24 hours or 5 lbs in 1 week. The patient voices understanding.

## 2018-02-01 LAB — CULTURE, URINE COMPREHENSIVE

## 2018-02-02 ENCOUNTER — Telehealth: Payer: Self-pay | Admitting: Family Medicine

## 2018-02-02 MED ORDER — CEPHALEXIN 500 MG PO CAPS: 500.0000 mg | ORAL_CAPSULE | Freq: Four times a day (QID) | ORAL | 0 refills | Status: DC

## 2018-02-02 MED ORDER — NITROFURANTOIN MONOHYD MACRO 100 MG PO CAPS: 100.0000 mg | ORAL_CAPSULE | Freq: Every day | ORAL | 2 refills | Status: DC

## 2018-02-02 NOTE — Telephone Encounter (Signed)
-----   Message from Hollice Espy, MD sent at 01/30/2018  4:54 PM EDT ----- With this very low colony count of bacteria, I would generally not treat but given that she is symptomatic, lets treat with Keflex 500 mg 4 times daily for 3 days and then transition to suppressive Macrobid 100 mg every day for 3 months.  Hollice Espy, MD

## 2018-02-02 NOTE — Telephone Encounter (Signed)
Patient notified and RX was sent to pharmacy

## 2018-02-04 ENCOUNTER — Other Ambulatory Visit: Payer: Self-pay | Admitting: Cardiovascular Disease

## 2018-02-04 DIAGNOSIS — L570 Actinic keratosis: Secondary | ICD-10-CM | POA: Diagnosis not present

## 2018-02-04 DIAGNOSIS — L219 Seborrheic dermatitis, unspecified: Secondary | ICD-10-CM | POA: Diagnosis not present

## 2018-02-04 DIAGNOSIS — D2222 Melanocytic nevi of left ear and external auricular canal: Secondary | ICD-10-CM | POA: Diagnosis not present

## 2018-02-04 DIAGNOSIS — L820 Inflamed seborrheic keratosis: Secondary | ICD-10-CM | POA: Diagnosis not present

## 2018-02-04 DIAGNOSIS — Z1283 Encounter for screening for malignant neoplasm of skin: Secondary | ICD-10-CM | POA: Diagnosis not present

## 2018-02-04 DIAGNOSIS — D485 Neoplasm of uncertain behavior of skin: Secondary | ICD-10-CM | POA: Diagnosis not present

## 2018-02-05 ENCOUNTER — Ambulatory Visit: Payer: PPO

## 2018-02-09 ENCOUNTER — Encounter: Payer: PPO | Admitting: Family Medicine

## 2018-02-09 DIAGNOSIS — M7042 Prepatellar bursitis, left knee: Secondary | ICD-10-CM | POA: Diagnosis not present

## 2018-02-09 DIAGNOSIS — Z0289 Encounter for other administrative examinations: Secondary | ICD-10-CM

## 2018-02-09 NOTE — Progress Notes (Signed)
This encounter was created in error - please disregard.

## 2018-02-10 ENCOUNTER — Encounter: Payer: Self-pay | Admitting: Family Medicine

## 2018-02-11 ENCOUNTER — Encounter: Payer: PPO | Admitting: Family Medicine

## 2018-02-12 ENCOUNTER — Telehealth: Payer: Self-pay | Admitting: Internal Medicine

## 2018-02-12 NOTE — Telephone Encounter (Signed)
Pt c/o Shortness Of Breath: STAT if SOB developed within the last 24 hours or pt is noticeably SOB on the phone  1. Are you currently SOB (can you hear that pt is SOB on the phone)? yes  2. How long have you been experiencing SOB? Since Monday  3. Are you SOB when sitting or when up moving around? Up moving around  4. Are you currently experiencing any other symptoms? No other symptoms

## 2018-02-12 NOTE — Telephone Encounter (Signed)
Pt called to report that she has been having increased shortness of breath to the point that she cannot do her normal ADl's such as make her bed as she normally does. She says that she is having worsening bilateral leg edema up to her knees. Her BP is 143/65 I had her check it while she is on the phone with me but her HR is not registering.. She says it has been in the 60's but she feels she is in Afib. I asked the pt to consider the ER because she sounds sob on the phone but she declines and says she cannot sit in the ER. She reports that her dizziness has improved some since her 01/13/18 appt but still mildly bothering her. She has taken her Lasix 40mg  tid consistently since Monday 10/7... She also reports a bad fall from loosing her balance this past Saturday but went for an xray of her knee on Monday also and had no break or fracture.  Advised her that I will forward her message to Dr. Caryl Comes.. She has an appt with him Tues 02/17/18 but pt says she is not sure she can keep feeling this bad until then.. I again offered her to go to the ER but she declined.

## 2018-02-13 ENCOUNTER — Ambulatory Visit (INDEPENDENT_AMBULATORY_CARE_PROVIDER_SITE_OTHER): Payer: PPO

## 2018-02-13 DIAGNOSIS — Z95810 Presence of automatic (implantable) cardiac defibrillator: Secondary | ICD-10-CM | POA: Diagnosis not present

## 2018-02-13 DIAGNOSIS — I5032 Chronic diastolic (congestive) heart failure: Secondary | ICD-10-CM

## 2018-02-13 MED ORDER — AMIODARONE HCL 200 MG PO TABS: ORAL_TABLET | ORAL | 0 refills | Status: DC

## 2018-02-13 NOTE — Progress Notes (Signed)
EPIC Encounter for ICM Monitoring  Patient Name: [name] is a 84 y.o. female Date: 02/13/2018 Primary Care Physican: Elby Beck, [place] Primary Cardiologist: Rockey Situ Electrophysiologist: Caryl Comes Dry Weight: 194 lbs (183-185 lbs)    Clinical Status (28-Jan-2018 to 13-Feb-2018)  Treated VT/VF 0 episodes   AT/AF 1 episode   Time in AT/AF 24.0 hr/day (100.0%)  Observations (2) (28-Jan-2018 to 13-Feb-2018)  AT/AF >= 6 hr for 16 days.  Patient Activity less than 1 hr/day for 2 weeks.     Patient spoke with Alvis Lemmings, Dr Olin Pia nurse today 02/13/2018 regarding symptoms that may be related to Afib.  Currently her feet swollen and weight has increased about 10 lbs.     Thoracic impedance abnormal suggesting fluid accumulation starting 02/06/2018.   Prescribed: Furosemide 40 mg take 1 tablet (40 mg total)three times daily and as needed for swelling.Patient self adjusts Furosemide as needed.  Labs: 03/04/2017 Creatinine 1.53, BUN 17, Potassium 4.5, Sodium 142, EGFR 32-37 11/07/2016 Creatinine 1.50, BUN 17, Potassium 4.0, Sodium 139, EGFR 35.45 06/03/2016 Creatinine 1.61, BUN 15, Potassium 4.6, Sodium 141, EGFR 30-35  Recommendations:  She has been advised by Dr Caryl Comes to go to ER if symptoms worsen before 02/17/2018 appt.   Follow-up plan: ICM clinic phone appointment on 02/24/2018 to recheck fluid levels.   Office appointment scheduled 02/17/2018 with Dr. Caryl Comes.    Copy of ICM check sent to Dr. Caryl Comes.   3 month ICM trend: 02/12/2018    1 Year ICM trend:       Rosalene Billings, RN 02/13/2018 4:39 PM

## 2018-02-13 NOTE — Telephone Encounter (Signed)
The patient's message from yesterday was reviewed by Dr. Caryl Comes. Per MD- the patient should report to the ER for further evaluation if her symptoms are bad enough that she feels she cannot wait until her follow up appointment on Tuesday with him.  I called and spoke with the patient. She states that he weight is down some today and she is feeling some better. She does not sound as winded in her voice. She reports that her SOB improves at rest/ with lying down, but is worse with exertion. I advised the patient that her a-fib may be causing her to feel more symptoms with exertion.   The patient is aware if her symptoms are better today, then we will see her in clinic on 10/15 as scheduled with Dr. Caryl Comes, however, if symptoms worsen over the weekend, then she is aware to report to the ER for further evaluation and possible treatment.  The patient voices understanding and is agreeable.   Loser dose amiodarone 200 mg BID x 2 weeks has been updated at the pharmacy for her.

## 2018-02-17 ENCOUNTER — Encounter: Payer: Self-pay | Admitting: Internal Medicine

## 2018-02-17 ENCOUNTER — Ambulatory Visit: Payer: PPO | Admitting: Internal Medicine

## 2018-02-17 ENCOUNTER — Other Ambulatory Visit
Admission: RE | Admit: 2018-02-17 | Discharge: 2018-02-17 | Disposition: A | Discharge status: Home / Self Care | Payer: PPO | Source: Ambulatory Visit | Attending: Internal Medicine | Admitting: Internal Medicine

## 2018-02-17 ENCOUNTER — Telehealth: Payer: Self-pay | Admitting: *Deleted

## 2018-02-17 VITALS — BP 142/70 | HR 66 | Ht 66.5 in | Wt 195.2 lb

## 2018-02-17 DIAGNOSIS — Z79899 Other long term (current) drug therapy: Secondary | ICD-10-CM | POA: Insufficient documentation

## 2018-02-17 DIAGNOSIS — I4819 Other persistent atrial fibrillation: Secondary | ICD-10-CM | POA: Diagnosis not present

## 2018-02-17 DIAGNOSIS — I495 Sick sinus syndrome: Secondary | ICD-10-CM | POA: Diagnosis not present

## 2018-02-17 DIAGNOSIS — Z95810 Presence of automatic (implantable) cardiac defibrillator: Secondary | ICD-10-CM | POA: Diagnosis not present

## 2018-02-17 DIAGNOSIS — I480 Paroxysmal atrial fibrillation: Secondary | ICD-10-CM

## 2018-02-17 DIAGNOSIS — I5032 Chronic diastolic (congestive) heart failure: Secondary | ICD-10-CM

## 2018-02-17 DIAGNOSIS — I428 Other cardiomyopathies: Secondary | ICD-10-CM

## 2018-02-17 DIAGNOSIS — I442 Atrioventricular block, complete: Secondary | ICD-10-CM

## 2018-02-17 LAB — CBC WITH DIFFERENTIAL/PLATELET
Abs Immature Granulocytes: 0.01 10*3/uL (ref 0.00–0.07)
Basophils Absolute: 0 10*3/uL (ref 0.0–0.1)
Basophils Relative: 1 %
Eosinophils Absolute: 0 10*3/uL (ref 0.0–0.5)
Eosinophils Relative: 1 %
HCT: 30.2 % — ABNORMAL LOW (ref 36.0–46.0)
Hemoglobin: 9.3 g/dL — ABNORMAL LOW (ref 12.0–15.0)
Immature Granulocytes: 0 %
Lymphocytes Relative: 15 %
Lymphs Abs: 0.5 10*3/uL — ABNORMAL LOW (ref 0.7–4.0)
MCH: 24.9 pg — ABNORMAL LOW (ref 26.0–34.0)
MCHC: 30.8 g/dL (ref 30.0–36.0)
MCV: 80.7 fL (ref 80.0–100.0)
Monocytes Absolute: 0.4 10*3/uL (ref 0.1–1.0)
Monocytes Relative: 12 %
Neutro Abs: 2.6 10*3/uL (ref 1.7–7.7)
Neutrophils Relative %: 71 %
Platelets: 245 10*3/uL (ref 150–400)
RBC: 3.74 MIL/uL — ABNORMAL LOW (ref 3.87–5.11)
RDW: 17.4 % — ABNORMAL HIGH (ref 11.5–15.5)
WBC: 3.7 10*3/uL — ABNORMAL LOW (ref 4.0–10.5)
nRBC: 0 % (ref 0.0–0.2)

## 2018-02-17 LAB — COMPREHENSIVE METABOLIC PANEL
ALT: 19 U/L (ref 0–44)
AST: 28 U/L (ref 15–41)
Albumin: 4.2 g/dL (ref 3.5–5.0)
Alkaline Phosphatase: 56 U/L (ref 38–126)
Anion gap: 7 (ref 5–15)
BUN: 23 mg/dL (ref 8–23)
CO2: 30 mmol/L (ref 22–32)
Calcium: 8.8 mg/dL — ABNORMAL LOW (ref 8.9–10.3)
Chloride: 101 mmol/L (ref 98–111)
Creatinine, Ser: 1.57 mg/dL — ABNORMAL HIGH (ref 0.44–1.00)
GFR calc Af Amer: 35 mL/min — ABNORMAL LOW (ref >60)
GFR calc non Af Amer: 30 mL/min — ABNORMAL LOW (ref >60)
Glucose, Bld: 106 mg/dL — ABNORMAL HIGH (ref 70–99)
Potassium: 3.4 mmol/L — ABNORMAL LOW (ref 3.5–5.1)
Sodium: 138 mmol/L (ref 135–145)
Total Bilirubin: 1 mg/dL (ref 0.3–1.2)
Total Protein: 6.9 g/dL (ref 6.5–8.1)

## 2018-02-17 LAB — TSH: TSH: 7.279 u[IU]/mL — ABNORMAL HIGH (ref 0.350–4.500)

## 2018-02-17 LAB — PROTIME-INR
INR: 5.7
Prothrombin Time: 50.5 seconds — ABNORMAL HIGH (ref 11.4–15.2)

## 2018-02-17 MED ORDER — METOLAZONE 5 MG PO TABS: ORAL_TABLET | ORAL | 0 refills | Status: DC

## 2018-02-17 MED ORDER — POTASSIUM CHLORIDE CRYS ER 20 MEQ PO TBCR: EXTENDED_RELEASE_TABLET | ORAL | 0 refills | Status: DC

## 2018-02-17 MED ORDER — TORSEMIDE 20 MG PO TABS: ORAL_TABLET | ORAL | 3 refills | Status: DC

## 2018-02-17 NOTE — Telephone Encounter (Signed)
Patient was seen in clinic this morning and labs were obtained (CBC/ CMET/ TSH/ INR).  All abnormal labs were reviewed with Dr. Caryl Comes by phone.  Hgb- 9.3 today/ (5 months ago- 10.6) TSH- 7.279 today K+- 3.4 today Creatinine- 1.57 today PT/INR- 50.5/ 5.70 today  Per Dr. Caryl Comes-  -Hgb stable for the patient as she has run 9.8-10.6 over the last year.  - TSH- abnormal on amiodarone- still on loading dose- per MD will address at office visit next week  - K+ low at 3.4- patient was started on metolazone 5 mg & torsemide 40 mg today  - creatinine- stable for patient  - PT/ INR- elevated  Orders received from Dr. Caryl Comes to:  1) START potassium 20 meq- take 2 tablets (40 meq) by mouth once daily x 3 days, then stop  2) TAKE torsemide 20 mg- 2 tablets (40 mg) by mouth once daily x 3 days, then take 1 tablet (20 mg) once daily starting on Friday 10/18  3) TAKE metolazone 5 mg- 1 tablet (5 mg) by mouth once daily 30 minutes prior to torsemide x 3 days, then stop  4) HOLD coumadin today (patient confirms she has not taken this yet today- normally on 2 mg tablets- 1.5 tablets (3 mg) on Tues/ S/S, and 1 tablet (2 mg) all other days of the week) Then take 1/2 tablet (1 mg) tomorrow (10/16), 1/2 tablet (1 mg) Thursday (10/17),  Then take 1 tablet (2 mg) once daily starting on Friday.  5) Follow up in office on 02/24/18 with Dr. Caryl Comes- repeat BMP/ INR to be obtained at that time  I have called and spoken with the patient. She is aware of all MD recommendations. She has written these down and read them all back to me. She verbalizes understanding.  She is to be cardioverted on Friday 02/20/18 as an outpatient. She is aware if she does not have a significant amount of diuresis this afternoon and through the night, she should report to the ER tomorrow as directed in clinic today by Dr. Caryl Comes. The patient states she will do this if necessary.

## 2018-02-17 NOTE — Progress Notes (Signed)
Patient Care Team: Elby Beck, FNP as PCP - General (Nurse Practitioner) Minna Merritts, MD as Consulting Physician (Cardiology) Deboraha Sprang, MD as Consulting Physician (Cardiology) Delana Meyer, Dolores Lory, MD as Consulting Physician (Vascular Surgery)   HPI  [name] is a 84 y.o. female Seen in followup for nonischemic cardiomyopathy for which she has a primary prevention ICD implanted initially 2006 with generator replacement about 5 years ago and replacement again 10/17   There had been puffiness and concern re infection;  cultures at change out were neg.  This is much improved    She thinks that she is aware of her atrial fibrillation   She has persistent  atrial fibrillation; previously  treated with disopyramide changed to  flecainide. But 2/2 side effects  back on disopyramide. ( the d/c summary 4/16 was WRONG-describing the use of amiodarone)   when seen 9/19 with significant atrial fibrillation burden disopyramide was discontinued and amiodarone subsequently initiated  Unfortunately she is continued to have shortness of breath and worsening peripheral edema.  She has orthopnea nocturnal dyspnea  Cath 2006 without obstructive disease DATE TEST EF   1/15 Echo  65 % Mod Pulm HTN, TR and Mild MR   4/16 Echo   65 % Mod-sev PulmHTN with RVE, Mod MR severe TR  6/17 Echo  65% Mild-mod MR  12/18 Myoview 65% No ischemia    Anticoagulation w warfarin;  No bleeding issues;  dose has not been changed with initiation of amiodarone no overt bleeding        Date Cr K Hgb  12/17 1.63 3.5 9.5  7/18 1.5 4.0 10.4  5/19 1.42 4.2 10.6     CHADS-VASc score of greater than or equal to 6 (age-30, gender-1, TIA-2 (4/16), heart failure-1)     She has a great grandson [name] to whom she is very attached.  He is a Furniture conservator/restorer and a cross-country running   Past Medical History:  Diagnosis Date  . Adjustment disorder with anxiety   . Anginal pain ([place])   .  Arthritis    "some in my hands" (11/11/2012)  . Atrial fibrillation ([place])   . Automatic implantable cardioverter-defibrillator in situ   . Blood in stool   . Carotid artery stenosis 09/2007   60-79% bilateral (stable)  . Carotid artery stenosis 10/2008   40-59% R 60-79%   . Chest pain, unspecified   . Chronic airway obstruction, not elsewhere classified   . Chronic bronchitis ([place])    "get it some; not q year" (11/11/2012)  . Chronic diastolic CHF (congestive heart failure) ([place]) 06/04/2013  . Chronic kidney disease, unspecified   . Coronary artery disease    non-obstructive by 2006 cath  . Dysuria   . Edema   . Encounter for therapeutic drug monitoring   . Exertional shortness of breath   . Frequent UTI    "get them a couple times/yr" (11/11/2012)  . Hemorrhage of rectum and anus   . High cholesterol   . History of blood transfusion 04/2011   "after hip OR" (11/11/2012)  . Hypertension 05/20/2011  . Hypertr obst cardiomyop   . Hypotension, unspecified    cardiac cath 2006..nonobstructive CAD 30-40s lesions.Marland KitchenETT 1/09 nondiagnostic due to poor HR response..Right Renal Cancer 2003  . Long term (current) use of anticoagulants   . Malignant neoplasm of kidney, except pelvis   . Osteoarthritis of right hip   . Other acute sinusitis   . Other  and unspecified coagulation defects   . Other malaise and fatigue   . Other screening mammogram   . PONV (postoperative nausea and vomiting)   . Pre-operative cardiovascular examination   . Renal cancer ([place]) 06/2001   Right  . Secondary cardiomyopathy, unspecified   . Sinus bradycardia   . Special screening for osteoporosis   . Urge incontinence     Past Surgical History:  Procedure Laterality Date  . ABDOMINAL HYSTERECTOMY  1975   for benign causes  . APPENDECTOMY    . BI-VENTRICULAR PACEMAKER UPGRADE  05/04/2010  . CARDIAC CATHETERIZATION  2006  . CATARACT EXTRACTION W/ INTRAOCULAR LENS  IMPLANT, BILATERAL  01/2006-02-2006  .  CHOLECYSTECTOMY N/A 11/11/2012   Procedure: LAPAROSCOPIC CHOLECYSTECTOMY WITH INTRAOPERATIVE CHOLANGIOGRAM;  Surgeon: Imogene Burn. Georgette Dover, MD;  Location: [facility];  Service: General;  Laterality: N/A;  . EP IMPLANTABLE DEVICE N/A 02/21/2016   Procedure: ICD Generator Changeout;  Surgeon: Deboraha Sprang, MD;  Location: [facility];  Service: Cardiovascular;  Laterality: N/A;  . INSERT / REPLACE / REMOVE PACEMAKER  05-01-11   02-28-05-/05-04-10-ICD-MEDTRONIC MAXIMAL DR  . JOINT REPLACEMENT    . LAPAROSCOPIC CHOLECYSTECTOMY  11/11/2012  . LAPAROSCOPIC LYSIS OF ADHESIONS N/A 11/11/2012   Procedure: LAPAROSCOPIC LYSIS OF ADHESIONS;  Surgeon: Imogene Burn. Georgette Dover, MD;  Location: [facility];  Service: General;  Laterality: N/A;  . NEPHRECTOMY Right 06/2001    S/P RENAL CELL CANCER  . TOTAL HIP ARTHROPLASTY Right 05/03/2011   Procedure: TOTAL HIP ARTHROPLASTY ANTERIOR APPROACH;  Surgeon: Mcarthur Rossetti;  Location: [facility];  Service: Orthopedics;  Laterality: Right;  Removal of Cannulated Screws Right Hip, Right Direct Anterior Hip Replacement    Current Outpatient Medications  Medication Sig Dispense Refill  . acetaminophen (TYLENOL) 500 MG tablet Take 1,000 mg by mouth every 6 (six) hours as needed for headache.    Marland Kitchen amiodarone (PACERONE) 200 MG tablet Take 1 tablet (200 mg) by mouth twice daily x 2 weeks, then take 1 tablet (200 mg) by mouth once daily (Patient taking differently: Take 200 mg by mouth 2 (two) times daily. Take 1 tablet (200 mg) by mouth twice daily x 2 weeks, then take 1 tablet (200 mg) by mouth once daily) 60 tablet 0  . carvedilol (COREG) 6.25 MG tablet Take 1 tablet (6.25 mg total) by mouth 2 (two) times daily. 180 tablet 3  . cephALEXin (KEFLEX) 500 MG capsule Take 1 capsule (500 mg total) by mouth 4 (four) times daily. 12 capsule 0  . cetirizine (ZYRTEC) 10 MG tablet Take 10 mg by mouth daily.     Marland Kitchen ezetimibe (ZETIA) 10 MG tablet Take 1 tablet (10 mg total) by mouth daily. 30 tablet 11   . furosemide (LASIX) 40 MG tablet TAKE 1 TABLET BY MOUTH AS DIRECTED. THREE TIMES DAILY AND AS NEEDED FOR SWELLING. 90 tablet 3  . metolazone (ZAROXOLYN) 2.5 MG tablet Take 1 tablet (2.5mg  total) 30 minutes prior to morning dose of Furosemide. Do not take any further doses unless directed by physician. 3 tablet 0  . nitrofurantoin, macrocrystal-monohydrate, (MACROBID) 100 MG capsule Take 1 capsule (100 mg total) by mouth daily. 30 capsule 2  . rosuvastatin (CRESTOR) 40 MG tablet Take 1 tablet (40 mg total) by mouth daily. 90 tablet 3  . sildenafil (REVATIO) 20 MG tablet Take 1 tablet (20 mg total) by mouth 3 (three) times daily. 90 tablet 6  . warfarin (COUMADIN) 2 MG tablet Take 1 1/2 tablets daily or AS  DIRECTED BY ANTICOAGULATION CLINIC. 130 tablet 1   No current facility-administered medications for this visit.     Allergies  Allergen Reactions  . Codeine Nausea And Vomiting  . Morphine And Related Nausea And Vomiting  . Sulfonamide Derivatives Other (See Comments)    REACTION: Dry mouth    Review of Systems negative except from HPI and PMH  Physical Exam BP (!) 142/70 (BP Location: Left Arm, Patient Position: Sitting, Cuff Size: Normal)   Pulse 66   Ht 5' 6.5" (1.689 m)   Wt 195 lb 4 oz (88.6 kg)   BMI 31.04 kg/m  Well developed and nourished in no acute distress HENT normal Neck supple with JVP-8-10 Clear Regular rate and rhythm, 2/6 systolic murmur radiating out to the apex Abd-soft with active BS No Clubbing cyanosis 2+ edema Skin-warm and dry A & Oriented  Grossly normal sensory and motor function    ECG atrial fibrillation with underlying ventricular pacing at 28 Personally reviewed    Assessment and  Plan  Nonischemic cardiomyopathy with interval normalization of LV function  Sinus node dysfunction device dependent   Mitral regurgitation  Complete heart block intermittent  Congestive heart failure acute/chronic/diastolic class III  Atrial  fibrillation/flutter persistent  Amiodarone therapy   Renal insufficiency grade 3   Dizziness   Implantable defibrillator    The patient has persistent atrial fibrillation with concomitant congestive heart failure.  She is not responding to oral Lasix.  This may be related to right heart failure and poor absorption so we will transition her to torsemide and change the dose from 40--40.  We will also have her use adjunctive metolazone 5 mg for 3 days.  In the event that she does not urinate, she will need intravenous diuresis and have recommended that she go to the emergency room for admission and then inpatient cardioversion  Otherwise we will schedule outpatient cardioversion for later this week  We will need to check amiodarone surveillance laboratories.  Will need an adjustment on her Coumadin with her amiodarone.  We will make a specific recommendation following her INR which we will check today.  Last was 2.8.  Suspect will be considerably higher with the amiodarone and right heart failure  More than 50% of 40 min was spent in counseling related to the above

## 2018-02-17 NOTE — Telephone Encounter (Signed)
Received call from lab at [facility] regarding critical INR of 5.7 Will forward to Dr Caryl Comes and Marsh Dolly RN for review

## 2018-02-17 NOTE — Patient Instructions (Addendum)
Medication Instructions:  - Your physician has recommended you make the following change in your medication:   1) STOP lasix (furosemide)   2) START demadex (torsemide) 20 mg- take 2 tablets (40 mg) by mouth once daily  3) START zaroxolyn (metolazone) 5 mg- take 1 tablet by mouth 30 minutes prior to torsemide dose as directed  If you need a refill on your cardiac medications before your next appointment, please call your pharmacy.   Lab work: - Your physician recommends that you have lab work today: CMET/ TSH/ INR/ CBC/ TSH- Programmer, systems at [facility]- 1st desk on the right to check in.  If you have labs (blood work) drawn today and your tests are completely normal, you will receive your results only by: Marland Kitchen MyChart Message (if you have MyChart) OR . A paper copy in the mail If you have any lab test that is abnormal or we need to change your treatment, we will call you to review the results.  Testing/Procedures: - Your physician has recommended that you have a Cardioversion (DCCV). Electrical Cardioversion uses a jolt of electricity to your heart either through paddles or wired patches attached to your chest. This is a controlled, usually prescheduled, procedure. Defibrillation is done under light anesthesia in the hospital, and you usually go home the day of the procedure. This is done to get your heart back into a normal rhythm. You are not awake for the procedure.   You are scheduled for a Cardioversion on Friday 02/20/18 with Dr. Rockey Situ.  Please arrive at the [facility] at 6:30 a.m. on the day of your procedure.  DIET INSTRUCTIONS:  Nothing to eat or drink after midnight the night before your procedure.  1) Labs:  today  2) Medications:  You may take all of your regular morning medications with enough water to get them down safely unless listed below: - hold torsemide the morning of your procedure  3) Must have a responsible person to drive you home.  4) Bring a current list  of your medications and current insurance cards.    If you have any questions after you get home, please call the office at [phone]   Follow-Up: At [facility], you and your health needs are our priority.  As part of our continuing mission to provide you with exceptional heart care, we have created designated Provider Care Teams.  These Care Teams include your primary Cardiologist (physician) and Advanced Practice Providers (APPs -  Physician Assistants and Nurse Practitioners) who all work together to provide you with the care you need, when you need it.  - In 3-4 weeks with Dr. Caryl Comes  Any Other Special Instructions Will Be Listed Below (If Applicable). - If you do not urinate with the change in your medication today, then you will need to report to the ER for IV medication and further treatment.

## 2018-02-19 ENCOUNTER — Ambulatory Visit: Payer: PPO

## 2018-02-19 ENCOUNTER — Telehealth: Payer: Self-pay | Admitting: Internal Medicine

## 2018-02-19 NOTE — Telephone Encounter (Signed)
Patient calling stating she is having a Cardioversion and is worried She states she's been taking her medications we prescribed but she is urinating a lot more and is worried for she is has lost some weight  Would like to make sure she is supposed to keep taking it even before her procedure in the morning   Please advise

## 2018-02-19 NOTE — Telephone Encounter (Signed)
I called and spoke with the patient. She has taken torsemide 40 mg once daily x 2 days and metolazone 5 mg once daily x 2 days. She was initially instructed to take this dosing for 3 days. She called this morning very excited that her weight is down from 195 lbs in office on 10/15 to 183 lbs at home this morning. She states she feels so much better- ambulating better/ able to lay down at night/ clothes are fitting this tight.   She states that from the torsemide/ metolazone that she took yesterday morning, she urinated all day, through the night, and is still urinating this morning. She was concerned if she took the same dose of torsemide and metolazone today she will still be urinating at the time of her DCCV in the morning.  She confirms her baseline weight is ~182 lbs.  I have advised her that she could take torsemide 20 mg this morning along w/ metolazone 2.5 mg. Confirmed with Dr. Caryl Comes that this is ok. She will hold her torsemide tomorrow morning and stop metolazone tomorrow. The patient is agreeable with the above and voices understanding.

## 2018-02-20 ENCOUNTER — Ambulatory Visit: Payer: PPO | Admitting: Anesthesiology

## 2018-02-20 ENCOUNTER — Encounter
Admission: RE | Disposition: A | Discharge status: Home / Self Care | Payer: Self-pay | Source: Ambulatory Visit | Attending: Cardiovascular Disease

## 2018-02-20 ENCOUNTER — Ambulatory Visit
Admission: RE | Admit: 2018-02-20 | Discharge: 2018-02-20 | Disposition: A | Discharge status: Home / Self Care | Payer: PPO | Source: Ambulatory Visit | Attending: Cardiovascular Disease | Admitting: Cardiovascular Disease

## 2018-02-20 ENCOUNTER — Other Ambulatory Visit: Payer: Self-pay

## 2018-02-20 DIAGNOSIS — I69359 Hemiplegia and hemiparesis following cerebral infarction affecting unspecified side: Secondary | ICD-10-CM | POA: Insufficient documentation

## 2018-02-20 DIAGNOSIS — I130 Hypertensive heart and chronic kidney disease with heart failure and stage 1 through stage 4 chronic kidney disease, or unspecified chronic kidney disease: Secondary | ICD-10-CM | POA: Diagnosis not present

## 2018-02-20 DIAGNOSIS — I69328 Other speech and language deficits following cerebral infarction: Secondary | ICD-10-CM | POA: Diagnosis not present

## 2018-02-20 DIAGNOSIS — Z7901 Long term (current) use of anticoagulants: Secondary | ICD-10-CM | POA: Diagnosis not present

## 2018-02-20 DIAGNOSIS — I509 Heart failure, unspecified: Secondary | ICD-10-CM | POA: Insufficient documentation

## 2018-02-20 DIAGNOSIS — Z87891 Personal history of nicotine dependence: Secondary | ICD-10-CM | POA: Insufficient documentation

## 2018-02-20 DIAGNOSIS — I110 Hypertensive heart disease with heart failure: Secondary | ICD-10-CM | POA: Insufficient documentation

## 2018-02-20 DIAGNOSIS — Z905 Acquired absence of kidney: Secondary | ICD-10-CM | POA: Diagnosis not present

## 2018-02-20 DIAGNOSIS — N189 Chronic kidney disease, unspecified: Secondary | ICD-10-CM | POA: Diagnosis not present

## 2018-02-20 DIAGNOSIS — K219 Gastro-esophageal reflux disease without esophagitis: Secondary | ICD-10-CM | POA: Insufficient documentation

## 2018-02-20 DIAGNOSIS — J449 Chronic obstructive pulmonary disease, unspecified: Secondary | ICD-10-CM | POA: Diagnosis not present

## 2018-02-20 DIAGNOSIS — I739 Peripheral vascular disease, unspecified: Secondary | ICD-10-CM | POA: Insufficient documentation

## 2018-02-20 DIAGNOSIS — Z882 Allergy status to sulfonamides status: Secondary | ICD-10-CM | POA: Diagnosis not present

## 2018-02-20 DIAGNOSIS — I480 Paroxysmal atrial fibrillation: Secondary | ICD-10-CM

## 2018-02-20 DIAGNOSIS — I4819 Other persistent atrial fibrillation: Secondary | ICD-10-CM | POA: Diagnosis not present

## 2018-02-20 DIAGNOSIS — I5032 Chronic diastolic (congestive) heart failure: Secondary | ICD-10-CM | POA: Diagnosis not present

## 2018-02-20 DIAGNOSIS — F419 Anxiety disorder, unspecified: Secondary | ICD-10-CM | POA: Insufficient documentation

## 2018-02-20 DIAGNOSIS — E039 Hypothyroidism, unspecified: Secondary | ICD-10-CM | POA: Diagnosis not present

## 2018-02-20 DIAGNOSIS — Z885 Allergy status to narcotic agent status: Secondary | ICD-10-CM | POA: Insufficient documentation

## 2018-02-20 DIAGNOSIS — I4891 Unspecified atrial fibrillation: Secondary | ICD-10-CM | POA: Diagnosis not present

## 2018-02-20 DIAGNOSIS — Z79899 Other long term (current) drug therapy: Secondary | ICD-10-CM | POA: Insufficient documentation

## 2018-02-20 DIAGNOSIS — I2510 Atherosclerotic heart disease of native coronary artery without angina pectoris: Secondary | ICD-10-CM | POA: Diagnosis not present

## 2018-02-20 HISTORY — PX: CARDIOVERSION: EP1203

## 2018-02-20 LAB — BASIC METABOLIC PANEL
Anion gap: 15 (ref 5–15)
BUN: 36 mg/dL — ABNORMAL HIGH (ref 8–23)
CO2: 32 mmol/L (ref 22–32)
Calcium: 9.1 mg/dL (ref 8.9–10.3)
Chloride: 91 mmol/L — ABNORMAL LOW (ref 98–111)
Creatinine, Ser: 2.08 mg/dL — ABNORMAL HIGH (ref 0.44–1.00)
GFR calc Af Amer: 25 mL/min — ABNORMAL LOW (ref >60)
GFR calc non Af Amer: 21 mL/min — ABNORMAL LOW (ref >60)
Glucose, Bld: 107 mg/dL — ABNORMAL HIGH (ref 70–99)
Potassium: 2.8 mmol/L — ABNORMAL LOW (ref 3.5–5.1)
Sodium: 138 mmol/L (ref 135–145)

## 2018-02-20 SURGERY — CARDIOVERSION (CATH LAB)
Anesthesia: General

## 2018-02-20 MED ORDER — PROPOFOL 10 MG/ML IV BOLUS
INTRAVENOUS | Status: DC | PRN
  Administered 2018-02-20: 60 mg via INTRAVENOUS

## 2018-02-20 MED ORDER — SODIUM CHLORIDE 0.9 % IV SOLN
INTRAVENOUS | Status: DC
  Administered 2018-02-20: 08:00:00 via INTRAVENOUS

## 2018-02-20 MED ORDER — PROPOFOL 10 MG/ML IV BOLUS
INTRAVENOUS | Status: AC
  Filled 2018-02-20: qty 20

## 2018-02-20 NOTE — Anesthesia Postprocedure Evaluation (Signed)
Anesthesia Post Note  Patient: [name]  Procedure(s) Performed: CARDIOVERSION (N/A )  Patient location during evaluation: Cath Lab Anesthesia Type: General Level of consciousness: awake and alert Pain management: pain level controlled Vital Signs Assessment: post-procedure vital signs reviewed and stable Respiratory status: spontaneous breathing and respiratory function stable Cardiovascular status: stable Anesthetic complications: no     Last Vitals:  Vitals:   02/20/18 0759 02/20/18 0800  BP: (!) 129/58 (!) 110/52  Pulse: 63 62  Resp: 19 17  Temp:    SpO2: 95% 96%    Last Pain:  Vitals:   02/20/18 0706  TempSrc: Oral  PainSc: 0-No pain                 KEPHART,WILLIAM K

## 2018-02-20 NOTE — H&P (Signed)
H&P Addendum, pre-cardioversion for atrial fib, persistent  Patient was seen and evaluated prior to -cardioversion procedure Symptoms, prior testing details again confirmed with the patient Patient examined, no significant change from prior exam Lab work reviewed in detail personally by myself Patient understands risk and benefit of the procedure, willing to proceed  Signed, Esmond Plants, MD, Ph.D [facility]

## 2018-02-20 NOTE — Anesthesia Post-op Follow-up Note (Signed)
Anesthesia QCDR form completed.        

## 2018-02-20 NOTE — Transfer of Care (Signed)
Immediate Anesthesia Transfer of Care Note  Patient: [name]  Procedure(s) Performed: CARDIOVERSION (N/A )  Patient Location: Short Stay  Anesthesia Type:General  Level of Consciousness: drowsy  Airway & Oxygen Therapy: Patient connected to nasal cannula oxygen  Post-op Assessment: Post -op Vital signs reviewed and stable  Post vital signs: stable  Last Vitals:  Vitals Value Taken Time  BP 129/58 02/20/2018  7:59 AM  Temp    Pulse 63 02/20/2018  7:59 AM  Resp 15 02/20/2018  7:50 AM  SpO2 95 % 02/20/2018  7:59 AM  Vitals shown include unvalidated device data.  Last Pain:  Vitals:   02/20/18 0706  TempSrc: Oral  PainSc: 0-No pain         Complications: No apparent anesthesia complications

## 2018-02-20 NOTE — CV Procedure (Signed)
Cardioversion procedure note For atrial fibrillation, persistent.  Procedure Details:  Consent: Risks of procedure as well as the alternatives and risks of each were explained to the (patient/caregiver). Consent for procedure obtained.  Time Out: Verified patient identification, verified procedure, site/side was marked, verified correct patient position, special equipment/implants available, medications/allergies/relevent history reviewed, required imaging and test results available. Performed  Patient placed on cardiac monitor, pulse oximetry, supplemental oxygen as necessary.  Sedation given: propofol IV, Dr. Kephart Pacer pads placed anterior and posterior chest.   Cardioverted 1 time(s).  Cardioverted at  150 J. Synchronized biphasic Converted to NSR   Evaluation: Findings: Post procedure EKG shows: NSR Complications: None Patient did tolerate procedure well.  Time Spent Directly with the Patient:  45 minutes   Tim Gollan, M.D., Ph.D. 

## 2018-02-20 NOTE — Anesthesia Preprocedure Evaluation (Signed)
Anesthesia Evaluation  Patient identified by MRN, date of birth, ID band Patient awake    Reviewed: Allergy & Precautions, NPO status , Patient's Chart, lab work & pertinent test results  History of Anesthesia Complications (+) PONV and history of anesthetic complications  Airway Mallampati: II       Dental  (+) Upper Dentures, Lower Dentures   Pulmonary neg sleep apnea, COPD,  COPD inhaler, former smoker,           Cardiovascular hypertension, Pt. on medications + Peripheral Vascular Disease and +CHF  + dysrhythmias Atrial Fibrillation + Cardiac Defibrillator      Neuro/Psych Anxiety TIA (speech difficulties, weakness)   GI/Hepatic Neg liver ROS, GERD  ,  Endo/Other  neg diabetes  Renal/GU Renal disease (s/p nephrectomy)     Musculoskeletal   Abdominal   Peds  Hematology  (+) anemia ,   Anesthesia Other Findings   Reproductive/Obstetrics                             Anesthesia Physical Anesthesia Plan  ASA: III  Anesthesia Plan: General   Post-op Pain Management:    Induction:   PONV Risk Score and Plan: 4 or greater  Airway Management Planned: Nasal Cannula  Additional Equipment:   Intra-op Plan:   Post-operative Plan:   Informed Consent: I have reviewed the patients History and Physical, chart, labs and discussed the procedure including the risks, benefits and alternatives for the proposed anesthesia with the patient or authorized representative who has indicated his/her understanding and acceptance.     Plan Discussed with:   Anesthesia Plan Comments:         Anesthesia Quick Evaluation

## 2018-02-23 NOTE — Progress Notes (Signed)
Patient Care Team: Elby Beck, FNP as PCP - General (Nurse Practitioner) Minna Merritts, MD as Consulting Physician (Cardiology) Deboraha Sprang, MD as Consulting Physician (Cardiology) Delana Meyer, Dolores Lory, MD as Consulting Physician (Vascular Surgery)   HPI  [name] is a 84 y.o. female Seen in followup for nonischemic cardiomyopathycomplicated by acute/chronic heart failure and persistent atrial fibrillation  Underwent DCCV last week; periprocedural labs were notable for K 2.8 and Cr 2.08  She is much improved with less edema and less dyspnea.  Her nadir weight was about 184 on her scale 186 today.  Some fatigue and some dizziness.  Also nausea  Has a primary prevention ICD implanted initially 2006 with generator replacement about 5 years ago and replacement again 10/17      She has persistent  atrial fibrillation; previously  treated with disopyramide changed to  flecainide. But 2/2 side effects  back on disopyramide. ( the d/c summary 4/16 was WRONG-describing the use of amiodarone)   when seen 9/19 with significant atrial fibrillation burden disopyramide was discontinued and amiodarone subsequently initiated     Cath 2006 without obstructive disease DATE TEST EF   1/15 Echo  65 % Mod Pulm HTN, TR and Mild MR   4/16 Echo   65 % Mod-sev PulmHTN with RVE, Mod MR severe TR  6/17 Echo  65% Mild-mod MR  12/18 Myoview 65% No ischemia    Anticoagulation with warfarin.         Date Cr K Hgb TSH LFTs  12/17 1.63 3.5 9.5    7/18 1.5 4.0 10.4    5/19 1.42 4.2 10.6    10/19 2.08 2.8 9.3 7.28             CHADS-VASc score of greater than or equal to 6 (age-78, gender-1, TIA-2 (4/16), heart failure-1)     She has a great grandson [name] to whom she is very attached.  He is a Furniture conservator/restorer and a cross-country running   Past Medical History:  Diagnosis Date  . Adjustment disorder with anxiety   . Anginal pain ([place])   . Arthritis    "some in my  hands" (11/11/2012)  . Atrial fibrillation ([place])   . Automatic implantable cardioverter-defibrillator in situ   . Blood in stool   . Carotid artery stenosis 09/2007   60-79% bilateral (stable)  . Carotid artery stenosis 10/2008   40-59% R 60-79%   . Chest pain, unspecified   . Chronic airway obstruction, not elsewhere classified   . Chronic bronchitis ([place])    "get it some; not q year" (11/11/2012)  . Chronic diastolic CHF (congestive heart failure) ([place]) 06/04/2013  . Chronic kidney disease, unspecified   . Coronary artery disease    non-obstructive by 2006 cath  . Dysuria   . Edema   . Encounter for therapeutic drug monitoring   . Exertional shortness of breath   . Frequent UTI    "get them a couple times/yr" (11/11/2012)  . Hemorrhage of rectum and anus   . High cholesterol   . History of blood transfusion 04/2011   "after hip OR" (11/11/2012)  . Hypertension 05/20/2011  . Hypertr obst cardiomyop   . Hypotension, unspecified    cardiac cath 2006..nonobstructive CAD 30-40s lesions.Marland KitchenETT 1/09 nondiagnostic due to poor HR response..Right Renal Cancer 2003  . Long term (current) use of anticoagulants   . Malignant neoplasm of kidney, except pelvis   . Osteoarthritis of right hip   .  Other acute sinusitis   . Other and unspecified coagulation defects   . Other malaise and fatigue   . Other screening mammogram   . PONV (postoperative nausea and vomiting)   . Pre-operative cardiovascular examination   . Renal cancer ([place]) 06/2001   Right  . Secondary cardiomyopathy, unspecified   . Sinus bradycardia   . Special screening for osteoporosis   . Urge incontinence     Past Surgical History:  Procedure Laterality Date  . ABDOMINAL HYSTERECTOMY  1975   for benign causes  . APPENDECTOMY    . BI-VENTRICULAR PACEMAKER UPGRADE  05/04/2010  . CARDIAC CATHETERIZATION  2006  . CARDIOVERSION N/A 02/20/2018   Procedure: CARDIOVERSION;  Surgeon: Minna Merritts, MD;  Location: [facility];   Service: Cardiovascular;  Laterality: N/A;  . CATARACT EXTRACTION W/ INTRAOCULAR LENS  IMPLANT, BILATERAL  01/2006-02-2006  . CHOLECYSTECTOMY N/A 11/11/2012   Procedure: LAPAROSCOPIC CHOLECYSTECTOMY WITH INTRAOPERATIVE CHOLANGIOGRAM;  Surgeon: Imogene Burn. Georgette Dover, MD;  Location: [place];  Service: General;  Laterality: N/A;  . EP IMPLANTABLE DEVICE N/A 02/21/2016   Procedure: ICD Generator Changeout;  Surgeon: Deboraha Sprang, MD;  Location: [facility];  Service: Cardiovascular;  Laterality: N/A;  . INSERT / REPLACE / REMOVE PACEMAKER  05-01-11   02-28-05-/05-04-10-ICD-MEDTRONIC MAXIMAL DR  . JOINT REPLACEMENT    . LAPAROSCOPIC CHOLECYSTECTOMY  11/11/2012  . LAPAROSCOPIC LYSIS OF ADHESIONS N/A 11/11/2012   Procedure: LAPAROSCOPIC LYSIS OF ADHESIONS;  Surgeon: Imogene Burn. Georgette Dover, MD;  Location: [place];  Service: General;  Laterality: N/A;  . NEPHRECTOMY Right 06/2001    S/P RENAL CELL CANCER  . TOTAL HIP ARTHROPLASTY Right 05/03/2011   Procedure: TOTAL HIP ARTHROPLASTY ANTERIOR APPROACH;  Surgeon: Mcarthur Rossetti;  Location: [facility];  Service: Orthopedics;  Laterality: Right;  Removal of Cannulated Screws Right Hip, Right Direct Anterior Hip Replacement    Current Outpatient Medications  Medication Sig Dispense Refill  . acetaminophen (TYLENOL) 500 MG tablet Take 1,000 mg by mouth every 6 (six) hours as needed for headache.    Marland Kitchen amiodarone (PACERONE) 200 MG tablet Take 1 tablet (200 mg) by mouth twice daily x 2 weeks, then take 1 tablet (200 mg) by mouth once daily (Patient taking differently: Take 200 mg by mouth 2 (two) times daily. Take 1 tablet (200 mg) by mouth twice daily x 2 weeks, then take 1 tablet (200 mg) by mouth once daily) 60 tablet 0  . carvedilol (COREG) 6.25 MG tablet Take 1 tablet (6.25 mg total) by mouth 2 (two) times daily. (Patient taking differently: Take 3.125 mg by mouth 2 (two) times daily. ) 180 tablet 3  . cetirizine (ZYRTEC) 10 MG tablet Take 10 mg by mouth daily.       Marland Kitchen ezetimibe (ZETIA) 10 MG tablet Take 1 tablet (10 mg total) by mouth daily. 30 tablet 11  . hydrocortisone 2.5 % cream Apply 1 application topically daily as needed for itching.    . metolazone (ZAROXOLYN) 5 MG tablet Take 1 tablet (5 mg) by mouth 30 minutes prior to torsemide dose as directed 10 tablet 0  . rosuvastatin (CRESTOR) 40 MG tablet Take 1 tablet (40 mg total) by mouth daily. 90 tablet 3  . warfarin (COUMADIN) 2 MG tablet Take 1 1/2 tablets daily or AS DIRECTED BY ANTICOAGULATION CLINIC. (Patient taking differently: Take 2 mg by mouth at bedtime. Take 1 1/2 tablets daily or AS DIRECTED BY ANTICOAGULATION CLINIC.) 130 tablet 1  . nitrofurantoin, macrocrystal-monohydrate, (MACROBID) 100  MG capsule Take 1 capsule (100 mg total) by mouth daily. (Patient not taking: Reported on 02/24/2018) 30 capsule 2  . potassium chloride SA (K-DUR,KLOR-CON) 20 MEQ tablet Take 2 tablets (40 meq) by mouth once daily x 3 days, then as directed by physician (Patient not taking: Reported on 02/24/2018) 30 tablet 0  . torsemide (DEMADEX) 20 MG tablet Take 2 tablets (40 mg) by mouth once daily (Patient not taking: Reported on 02/24/2018) 60 tablet 3   No current facility-administered medications for this visit.     Allergies  Allergen Reactions  . Codeine Nausea And Vomiting  . Morphine And Related Nausea And Vomiting  . Sulfonamide Derivatives Other (See Comments)    Dry mouth    Review of Systems negative except from HPI and PMH  Physical Exam BP 120/60 (BP Location: Left Arm, Patient Position: Sitting, Cuff Size: Normal)   Ht 5' 6.5" (1.689 m)   Wt 188 lb 12 oz (85.6 kg)   BMI 30.01 kg/m  Well developed and nourished in no acute distress HENT normal Neck supple with JVP-6 Clear Regular rate and rhythm, no murmurs or gallops Abd-soft with active BS No Clubbing cyanosis tredema Skin-warm and dry A & Oriented  Grossly normal sensory and motor function   ECG AV pacing Personally reviewed     Assessment and  Plan  Nonischemic cardiomyopathy with interval normalization of LV function  Sinus node dysfunction device dependent   Mitral regurgitation  Complete heart block intermittent  Congestive heart failure acute/chronic/diastolic class III  Atrial fibrillation/flutter persistent  Amiodarone therapy   Acute chronic Renal insufficiency grade 3   Dizziness   Implantable defibrillator  Nausea  hypothyroid    She is holding sinus rhythm.  Volume status is much improved.  We will continue her on torsemide 20 mg daily.  She is to take an extra 20 mg in the event that her weight exceeds 187.  We need to determine her potassium supplementation needs.  Metabolic profile is pending.  Her dizziness is chronic.  Some of it may be aggravated by intravascular volume depletion; is a little bit early but amiodarone can also be a contributor.  Her nausea could be either from the amiodarone or potassium supplements.  We will have her take her amiodarone with her evening meal to see how she tolerates it.  She will let us know next week about her weights and her urine output.  Need to check her renal function again today.  We spent more than 50% of our >25 min visit in face to face counseling regarding the above

## 2018-02-24 ENCOUNTER — Telehealth: Payer: Self-pay | Admitting: Internal Medicine

## 2018-02-24 ENCOUNTER — Other Ambulatory Visit
Admission: RE | Admit: 2018-02-24 | Discharge: 2018-02-24 | Disposition: A | Discharge status: Home / Self Care | Payer: PPO | Source: Ambulatory Visit | Attending: Internal Medicine | Admitting: Internal Medicine

## 2018-02-24 ENCOUNTER — Ambulatory Visit (INDEPENDENT_AMBULATORY_CARE_PROVIDER_SITE_OTHER): Payer: PPO | Admitting: Internal Medicine

## 2018-02-24 ENCOUNTER — Encounter: Payer: Self-pay | Admitting: Internal Medicine

## 2018-02-24 ENCOUNTER — Telehealth: Payer: Self-pay | Admitting: Cardiology

## 2018-02-24 VITALS — BP 120/60 | Ht 66.5 in | Wt 188.8 lb

## 2018-02-24 DIAGNOSIS — I428 Other cardiomyopathies: Secondary | ICD-10-CM | POA: Insufficient documentation

## 2018-02-24 DIAGNOSIS — I5032 Chronic diastolic (congestive) heart failure: Secondary | ICD-10-CM

## 2018-02-24 DIAGNOSIS — I4819 Other persistent atrial fibrillation: Secondary | ICD-10-CM | POA: Insufficient documentation

## 2018-02-24 DIAGNOSIS — Z7901 Long term (current) use of anticoagulants: Secondary | ICD-10-CM

## 2018-02-24 DIAGNOSIS — Z95810 Presence of automatic (implantable) cardiac defibrillator: Secondary | ICD-10-CM | POA: Diagnosis not present

## 2018-02-24 DIAGNOSIS — E876 Hypokalemia: Secondary | ICD-10-CM | POA: Diagnosis not present

## 2018-02-24 DIAGNOSIS — I4891 Unspecified atrial fibrillation: Secondary | ICD-10-CM

## 2018-02-24 DIAGNOSIS — Z5181 Encounter for therapeutic drug level monitoring: Secondary | ICD-10-CM

## 2018-02-24 LAB — CBC WITH DIFFERENTIAL/PLATELET
Abs Immature Granulocytes: 0.01 10*3/uL (ref 0.00–0.07)
Basophils Absolute: 0 10*3/uL (ref 0.0–0.1)
Basophils Relative: 1 %
Eosinophils Absolute: 0.1 10*3/uL (ref 0.0–0.5)
Eosinophils Relative: 2 %
HCT: 30.9 % — ABNORMAL LOW (ref 36.0–46.0)
Hemoglobin: 9.1 g/dL — ABNORMAL LOW (ref 12.0–15.0)
Immature Granulocytes: 0 %
Lymphocytes Relative: 17 %
Lymphs Abs: 0.7 10*3/uL (ref 0.7–4.0)
MCH: 23.6 pg — ABNORMAL LOW (ref 26.0–34.0)
MCHC: 29.4 g/dL — ABNORMAL LOW (ref 30.0–36.0)
MCV: 80.3 fL (ref 80.0–100.0)
Monocytes Absolute: 0.5 10*3/uL (ref 0.1–1.0)
Monocytes Relative: 13 %
Neutro Abs: 2.7 10*3/uL (ref 1.7–7.7)
Neutrophils Relative %: 67 %
Platelets: 231 10*3/uL (ref 150–400)
RBC: 3.85 MIL/uL — ABNORMAL LOW (ref 3.87–5.11)
RDW: 17.3 % — ABNORMAL HIGH (ref 11.5–15.5)
WBC: 4 10*3/uL (ref 4.0–10.5)
nRBC: 0 % (ref 0.0–0.2)

## 2018-02-24 LAB — CUP PACEART INCLINIC DEVICE CHECK
Battery Remaining Longevity: 66 mo
Battery Voltage: 2.97 V
Brady Statistic AP VP Percent: 57.37 %
Brady Statistic AP VS Percent: 0 %
Brady Statistic AS VP Percent: 42.56 %
Brady Statistic AS VS Percent: 0.06 %
Brady Statistic RA Percent Paced: 50.8 %
Brady Statistic RV Percent Paced: 99.9 %
Date Time Interrogation Session: 20191022132549
HighPow Impedance: 39 Ohm
HighPow Impedance: 48 Ohm
Implantable Lead Implant Date: 20061026
Implantable Lead Implant Date: 20111230
Implantable Lead Location: 753859
Implantable Lead Location: 753860
Implantable Lead Model: 5076
Implantable Lead Model: 6947
Implantable Pulse Generator Implant Date: 20171018
Lead Channel Impedance Value: 361 Ohm
Lead Channel Impedance Value: 456 Ohm
Lead Channel Impedance Value: 456 Ohm
Lead Channel Pacing Threshold Amplitude: 0.75 V
Lead Channel Pacing Threshold Amplitude: 1.5 V
Lead Channel Pacing Threshold Pulse Width: 0.4 ms
Lead Channel Pacing Threshold Pulse Width: 0.6 ms
Lead Channel Sensing Intrinsic Amplitude: 10.875 mV
Lead Channel Setting Pacing Amplitude: 2.5 V
Lead Channel Setting Pacing Amplitude: 2.5 V
Lead Channel Setting Pacing Pulse Width: 0.4 ms
Lead Channel Setting Sensing Sensitivity: 0.3 mV

## 2018-02-24 LAB — PROTIME-INR
INR: 3.96
Prothrombin Time: 38.1 seconds — ABNORMAL HIGH (ref 11.4–15.2)

## 2018-02-24 LAB — BASIC METABOLIC PANEL
Anion gap: 10 (ref 5–15)
BUN: 34 mg/dL — ABNORMAL HIGH (ref 8–23)
CO2: 31 mmol/L (ref 22–32)
Calcium: 9.1 mg/dL (ref 8.9–10.3)
Chloride: 98 mmol/L (ref 98–111)
Creatinine, Ser: 1.81 mg/dL — ABNORMAL HIGH (ref 0.44–1.00)
GFR calc Af Amer: 29 mL/min — ABNORMAL LOW (ref >60)
GFR calc non Af Amer: 25 mL/min — ABNORMAL LOW (ref >60)
Glucose, Bld: 107 mg/dL — ABNORMAL HIGH (ref 70–99)
Potassium: 3.1 mmol/L — ABNORMAL LOW (ref 3.5–5.1)
Sodium: 139 mmol/L (ref 135–145)

## 2018-02-24 MED ORDER — TORSEMIDE 20 MG PO TABS: ORAL_TABLET | ORAL | Status: DC

## 2018-02-24 MED ORDER — POTASSIUM CHLORIDE CRYS ER 20 MEQ PO TBCR: EXTENDED_RELEASE_TABLET | ORAL | 0 refills | Status: DC

## 2018-02-24 NOTE — Telephone Encounter (Signed)
Spoke with pt and reminded pt of remote transmission that is due today. Pt verbalized understanding.   

## 2018-02-24 NOTE — Patient Instructions (Signed)
Medication Instructions:  - Your physician has recommended you make the following change in your medication:   1) Torsemide 20 mg- take 1 tablet by mouth once daily, you may take an extra tablet (20 mg) once daily as needed for weight 187 lbs/ greater  2) Make sure you are taking your amiodarone doses with a heavy meal, if nausea is no better, then take 1/2 tablet twice daily until 10/28, then take 1 tablet (200 mg) by mouth once daily  If you need a refill on your cardiac medications before your next appointment, please call your pharmacy.   Lab work: - Your physician recommends that you have lab work today: BMP/ CBC/ INR  If you have labs (blood work) drawn today and your tests are completely normal, you will receive your results only by: Marland Kitchen MyChart Message (if you have MyChart) OR . A paper copy in the mail If you have any lab test that is abnormal or we need to change your treatment, we will call you to review the results.  Testing/Procedures: - none ordered  Follow-Up: At [facility], you and your health needs are our priority.  As part of our continuing mission to provide you with exceptional heart care, we have created designated Provider Care Teams.  These Care Teams include your primary Cardiologist (physician) and Advanced Practice Providers (APPs -  Physician Assistants and Nurse Practitioners) who all work together to provide you with the care you need, when you need it. Marland Kitchen as scheduled with Dr. Caryl Comes in November  Any Other Special Instructions Will Be Listed Below (If Applicable). - Your goal dry weight should be 186 lbs at this time

## 2018-02-24 NOTE — Telephone Encounter (Signed)
Notes recorded by Deboraha Sprang, MD on 02/24/2018 at 2:49 PM EDT Please Inform Patient  Labs are improved but still K is low,  40 bid x 2 days   Resume torsemide 20 daily     I spoke with the patient. She is aware of Dr. Olin Pia recommendations to take: 1) potassium 40 meq BID x 2 days 2) clarified w/ MD going forward that she will need to take potassium 20 meq once daily  I have advised the patient that her INR is still slightly elevated at 3.96. She is due to take 1.5 tablets of coumadin tonight. I have advised her to take 1 tablet tonight and I will forward a message to Caren Griffins at her PCP office to please follow up with her to manage her INR.  Patient currently on amiodarone 200 mg BID until 03/02/18 and then she will be on amiodarone 200 mg once daily.

## 2018-02-26 ENCOUNTER — Telehealth: Payer: Self-pay

## 2018-02-26 ENCOUNTER — Other Ambulatory Visit: Payer: Self-pay | Admitting: Internal Medicine

## 2018-02-26 NOTE — Telephone Encounter (Signed)
Mandy,  Patient will see you tomorrow, 10/25 at 2:30.  Information is attached.  Thanks, Villa Herb

## 2018-02-26 NOTE — Progress Notes (Signed)
ICM Remote transmission scheduled for 04/16/2018 since patient has office defib check with Dr Caryl Comes on 03/17/2018

## 2018-02-26 NOTE — Telephone Encounter (Signed)
Copied from [place] [id number]. Topic: Appointment Scheduling - Scheduling Inquiry for Clinic >> Feb 26, 2018  3:15 PM Vernona Rieger wrote: Reason for CRM: Patient states she would like Villa Herb to call her about getting straightened out on her warfarin (COUMADIN) 2 MG tablet. She said that Dr Danie Chandler office said she was going to get in touch with [facility]. Please call pt, patient said she has been taking one pill a night.

## 2018-02-27 ENCOUNTER — Ambulatory Visit (INDEPENDENT_AMBULATORY_CARE_PROVIDER_SITE_OTHER): Payer: PPO

## 2018-02-27 DIAGNOSIS — E538 Deficiency of other specified B group vitamins: Secondary | ICD-10-CM | POA: Diagnosis not present

## 2018-02-27 DIAGNOSIS — Z7902 Long term (current) use of antithrombotics/antiplatelets: Secondary | ICD-10-CM

## 2018-02-27 DIAGNOSIS — Z7901 Long term (current) use of anticoagulants: Secondary | ICD-10-CM

## 2018-02-27 LAB — POCT INR: INR: 4.8 — AB (ref 2.0–3.0)

## 2018-02-27 MED ORDER — CYANOCOBALAMIN 1000 MCG/ML IJ SOLN
1000.0000 ug | Freq: Once | INTRAMUSCULAR | Status: AC
  Administered 2018-02-27: 1000 ug via INTRAMUSCULAR

## 2018-02-27 NOTE — Patient Instructions (Signed)
INR today 4.8  *Patient has recently started on amiodarone, just finished abx therapy plus has had an increase in lasix and new start on potassium.  All medication changes likely having an effect on supratherapeutic reading.    Patient is to hold Coumadin today 10/25, and tomorrow 10/26 then start on 1 (2mg ) pill daily with recheck next week on Thursday (10/31).  *this is a decrease of close to 20% of prior weekly regimen.  Patient understands risks associated with a supratherapeutic reading and denies an abnormal bruising or bleeding at this point.  She will go to ER if any concerns develop.  Also, patient past due for monthly IM B12 injection.  B12 injection administered by this nurse to Right Deltoid.  Patient tolerated well.

## 2018-02-27 NOTE — Progress Notes (Signed)
Per orders of Tor Netters, NP, injection of Vitamin B12 given by Diamond Nickel. Patient tolerated injection well.

## 2018-02-27 NOTE — Telephone Encounter (Signed)
Noted.  Patient seen today in coumadin clinic.  Please refer to coag encounter note for full list of details.    Thanks.

## 2018-03-02 NOTE — Progress Notes (Signed)
I have reviewed this visit and I agree on the patient's plan of dosage and recommendations. Deborah B Gessner, FNP   

## 2018-03-05 ENCOUNTER — Ambulatory Visit (INDEPENDENT_AMBULATORY_CARE_PROVIDER_SITE_OTHER): Payer: PPO | Admitting: General Practice

## 2018-03-05 DIAGNOSIS — Z7902 Long term (current) use of antithrombotics/antiplatelets: Secondary | ICD-10-CM | POA: Diagnosis not present

## 2018-03-05 DIAGNOSIS — Z7901 Long term (current) use of anticoagulants: Secondary | ICD-10-CM

## 2018-03-05 LAB — POCT INR: INR: 2.8 (ref 2.0–3.0)

## 2018-03-05 NOTE — Patient Instructions (Addendum)
Pre visit review using our clinic review tool, if applicable. No additional management support is needed unless otherwise documented below in the visit note.  Continue to take 1 (2 mg) tablet daily.  Re-check in 2 weeks.  *Patient has recently started on amiodarone.

## 2018-03-06 ENCOUNTER — Telehealth: Payer: Self-pay | Admitting: Internal Medicine

## 2018-03-06 NOTE — Telephone Encounter (Signed)
Patient calling After cardioversion she has been having a fluid issue States that she has been taking torsemide medication but was also given Metolazone just in case Patient's weight is 4 pounds over what Dr. Caryl Comes recommened Would like to know if she should take one or two of the Metolazone medication Please call to discuss

## 2018-03-08 NOTE — Telephone Encounter (Signed)
OK to take one dose of metolazone. Daily weights. If weight still up, plan to take extra dose of metolazone the next day.

## 2018-03-09 NOTE — Telephone Encounter (Signed)
I spoke with the patient. She states she had been taking torsemide 20 mg once daily, but starting last weekend, she started taking torsemide 20 mg BID as her weight was running ~ 190 lbs.  Weights Friday 11/1- 191 lbs 6oz Saturday 11/2- 189 lbs Sunday- no weight Today- 190 lbs  Goal dry weight is currently 186 lbs.  Per the patient, she can feel fullness in her neck and she does sound SOB over the phone. I have advised her of Dr. Macky Lower recommendations that she may take a metolazone today and if her weight is still up tomorrow, then she may take an additional metolazone.  She has been taking torsemide 20 mg in the AM & 20 mg ~ 1 pm as she feels she urinates more this way. She will go ahead and take her metolazone 5 mg x 1 dose now and take her torsemide 20 mg x 1 dose at 1 pm.  I have advised her if her weight is not back down to 186-187 lbs, then to please call us back tomorrow with her weight prior to taking a 2nd dose of metolazone.   She will need a repeat BMP if taking the 2nd dose.  K+ was low on 02/24/18 at 3.1- she was instructed to take an potassium 40 mg BID x 2 days at that time.   The patient voices understanding of the above and is agreeable.

## 2018-03-12 ENCOUNTER — Ambulatory Visit: Payer: PPO

## 2018-03-16 ENCOUNTER — Ambulatory Visit (INDEPENDENT_AMBULATORY_CARE_PROVIDER_SITE_OTHER): Payer: PPO | Admitting: *Deleted

## 2018-03-16 DIAGNOSIS — I428 Other cardiomyopathies: Secondary | ICD-10-CM | POA: Diagnosis not present

## 2018-03-16 DIAGNOSIS — I5032 Chronic diastolic (congestive) heart failure: Secondary | ICD-10-CM

## 2018-03-16 NOTE — Progress Notes (Signed)
Remote ICD transmission.   

## 2018-03-17 ENCOUNTER — Ambulatory Visit (INDEPENDENT_AMBULATORY_CARE_PROVIDER_SITE_OTHER): Payer: PPO | Admitting: Internal Medicine

## 2018-03-17 ENCOUNTER — Encounter: Payer: Self-pay | Admitting: Internal Medicine

## 2018-03-17 VITALS — BP 140/60 | HR 62 | Ht 66.5 in | Wt 187.2 lb

## 2018-03-17 DIAGNOSIS — I442 Atrioventricular block, complete: Secondary | ICD-10-CM | POA: Diagnosis not present

## 2018-03-17 DIAGNOSIS — Z95810 Presence of automatic (implantable) cardiac defibrillator: Secondary | ICD-10-CM

## 2018-03-17 DIAGNOSIS — Z79899 Other long term (current) drug therapy: Secondary | ICD-10-CM | POA: Diagnosis not present

## 2018-03-17 DIAGNOSIS — I428 Other cardiomyopathies: Secondary | ICD-10-CM | POA: Diagnosis not present

## 2018-03-17 DIAGNOSIS — I4819 Other persistent atrial fibrillation: Secondary | ICD-10-CM | POA: Diagnosis not present

## 2018-03-17 DIAGNOSIS — I5032 Chronic diastolic (congestive) heart failure: Secondary | ICD-10-CM

## 2018-03-17 DIAGNOSIS — I495 Sick sinus syndrome: Secondary | ICD-10-CM

## 2018-03-17 LAB — CUP PACEART INCLINIC DEVICE CHECK
Battery Remaining Longevity: 69 mo
Battery Voltage: 2.97 V
Brady Statistic AP VP Percent: 1.11 %
Brady Statistic AP VS Percent: 0.01 %
Brady Statistic AS VP Percent: 95.79 %
Brady Statistic AS VS Percent: 3.08 %
Brady Statistic RA Percent Paced: 0.77 %
Brady Statistic RV Percent Paced: 96.98 %
Date Time Interrogation Session: 20191015133211
HighPow Impedance: 36 Ohm
HighPow Impedance: 47 Ohm
Implantable Lead Implant Date: 20061026
Implantable Lead Implant Date: 20111230
Implantable Lead Location: 753859
Implantable Lead Location: 753860
Implantable Lead Model: 5076
Implantable Lead Model: 6947
Implantable Pulse Generator Implant Date: 20171018
Lead Channel Impedance Value: 361 Ohm
Lead Channel Impedance Value: 418 Ohm
Lead Channel Impedance Value: 456 Ohm
Lead Channel Pacing Threshold Amplitude: 0.75 V
Lead Channel Pacing Threshold Pulse Width: 0.4 ms
Lead Channel Sensing Intrinsic Amplitude: 1.625 mV
Lead Channel Sensing Intrinsic Amplitude: 10.5 mV
Lead Channel Setting Pacing Amplitude: 2.5 V
Lead Channel Setting Pacing Amplitude: 2.5 V
Lead Channel Setting Pacing Pulse Width: 0.4 ms
Lead Channel Setting Sensing Sensitivity: 0.3 mV

## 2018-03-17 NOTE — Patient Instructions (Addendum)
Medication Instructions:  - Your physician has recommended you make the following change in your medication:   1) INCREASE demadex (torsemide) 20 mg- take 2 tablets (40 mg) by mouth once daily  2) Metolazone 5 mg- take 1 tablet by mouth once daily as needed for weight >/ = 192 lbs (call the office if you feel like you need this more than once a week)  3) INCREASE amiodarone 200 mg- take 1 tablet TWICE daily until your cardioversion, then can decrease back to 100 mg TWICE daily  If you need a refill on your cardiac medications before your next appointment, please call your pharmacy.   Lab work: - Your physician recommends that you have lab work today: BMP/ CBC  - Your physician recommends that you return for lab work in: 2 weeks- BMP  If you have labs (blood work) drawn today and your tests are completely normal, you will receive your results only by: Marland Kitchen MyChart Message (if you have MyChart) OR . A paper copy in the mail If you have any lab test that is abnormal or we need to change your treatment, we will call you to review the results.  Testing/Procedures: - Your physician has recommended that you have a Cardioversion (DCCV). Electrical Cardioversion uses a jolt of electricity to your heart either through paddles or wired patches attached to your chest. This is a controlled, usually prescheduled, procedure. Defibrillation is done under light anesthesia in the hospital, and you usually go home the day of the procedure. This is done to get your heart back into a normal rhythm. You are not awake for the procedure.  You are scheduled for a Cardioversion on Friday 03/27/18 with Dr. Rockey Situ.  Please arrive at the [facility] at 6:30 a.m. on the day of your procedure.  DIET INSTRUCTIONS:  Nothing to eat or drink after midnight except your medications with a            sip of water.         1) Labs: today  2) Medications:  You may take all of your regular medications the morning  of your procedure unless listed below:  - HOLD torsemide the morning of your procedure  3) Must have a responsible person to drive you home.  4) Bring a current list of your medications and current insurance cards.    If you have any questions after you get home, please call the office at [phone]   Follow-Up: At [facility], you and your health needs are our priority.  As part of our continuing mission to provide you with exceptional heart care, we have created designated Provider Care Teams.  These Care Teams include your primary Cardiologist (physician) and Advanced Practice Providers (APPs -  Physician Assistants and Nurse Practitioners) who all work together to provide you with the care you need, when you need it. . 3 weeks with Dr. Caryl Comes  Any Other Special Instructions Will Be Listed Below (If Applicable). - N/A

## 2018-03-17 NOTE — Progress Notes (Signed)
Patient Care Team: Elby Beck, FNP as PCP - General (Nurse Practitioner) Minna Merritts, MD as Consulting Physician (Cardiology) Deboraha Sprang, MD as Consulting Physician (Cardiology) Delana Meyer, Dolores Lory, MD as Consulting Physician (Vascular Surgery)   HPI  [name] is a 84 y.o. female Seen in followup for nonischemic cardiomyopathycomplicated by acute/chronic heart failure and persistent atrial fibrillation  Underwent DCCV last week; periprocedural labs were notable for K 2.8 and Cr 2.08    Has a primary prevention ICD implanted initially 2006 with generator replacement about 5 years ago and replacement again 10/17      She has persistent  atrial fibrillation; previously treated with disopyramide changed to  flecainide. But 2/2 side effects  back on disopyramide. ( the d/c summary 4/16 was WRONG-describing the use of amiodarone)   when seen 9/19 with significant atrial fibrillation burden disopyramide was discontinued and amiodarone subsequently initiated;   has had nausea but has tolerated taking it 100 mg twice daily  Underwent cardioversion 10/19 held sinus rhythm for only a few days.  Intercurrently she was very volume overloaded and underwent metolazone associated diuresis of about 10-15 pounds.  Marked improvement in exercise tolerance.  She is needed metolazone once intercurrently.  Currently using torsemide 20 daily with modest urine output.  Using a second 20 mg a couple of times a week  Dyspnea on exertion is much improved.  She can walk around the office.  She did work in the garden yesterday with her grandson.  Chronic edema  Infrequent sporadic sharp chest pains lasting 1-2 seconds     Cath 2006 without obstructive disease DATE TEST EF   1/15 Echo  65 % Mod Pulm HTN, TR and Mild MR   4/16 Echo   65 % Mod-sev PulmHTN with RVE, Mod MR severe TR  6/17 Echo  65% Mild-mod MR  12/18 Myoview 65% No ischemia         Anticoagulation with  warfarin.         Date Cr K Hgb TSH LFTs  12/17 1.63 3.5 9.5    7/18 1.5 4.0 10.4    5/19 1.42 4.2 10.6    10/19 2.08 2.8 9.3 7.28   10/19 1.81 3.1 9.1       CHADS-VASc score of greater than or equal to 6 (age-39, gender-1, TIA-2 (4/16), heart failure-1)     She has a great grandson [name] to whom she is very attached.  He is a Furniture conservator/restorer and a cross-country running   Past Medical History:  Diagnosis Date  . Adjustment disorder with anxiety   . Anginal pain ([place])   . Arthritis    "some in my hands" (11/11/2012)  . Atrial fibrillation ([place])   . Automatic implantable cardioverter-defibrillator in situ   . Blood in stool   . Carotid artery stenosis 09/2007   60-79% bilateral (stable)  . Carotid artery stenosis 10/2008   40-59% R 60-79%   . Chest pain, unspecified   . Chronic airway obstruction, not elsewhere classified   . Chronic bronchitis ([place])    "get it some; not q year" (11/11/2012)  . Chronic diastolic CHF (congestive heart failure) ([place]) 06/04/2013  . Chronic kidney disease, unspecified   . Coronary artery disease    non-obstructive by 2006 cath  . Dysuria   . Edema   . Encounter for therapeutic drug monitoring   . Exertional shortness of breath   . Frequent UTI    "get  them a couple times/yr" (11/11/2012)  . Hemorrhage of rectum and anus   . High cholesterol   . History of blood transfusion 04/2011   "after hip OR" (11/11/2012)  . Hypertension 05/20/2011  . Hypertr obst cardiomyop   . Hypotension, unspecified    cardiac cath 2006..nonobstructive CAD 30-40s lesions.Marland KitchenETT 1/09 nondiagnostic due to poor HR response..Right Renal Cancer 2003  . Long term (current) use of anticoagulants   . Malignant neoplasm of kidney, except pelvis   . Osteoarthritis of right hip   . Other acute sinusitis   . Other and unspecified coagulation defects   . Other malaise and fatigue   . Other screening mammogram   . PONV (postoperative nausea and vomiting)   . Pre-operative  cardiovascular examination   . Renal cancer ([place]) 06/2001   Right  . Secondary cardiomyopathy, unspecified   . Sinus bradycardia   . Special screening for osteoporosis   . Urge incontinence     Past Surgical History:  Procedure Laterality Date  . ABDOMINAL HYSTERECTOMY  1975   for benign causes  . APPENDECTOMY    . BI-VENTRICULAR PACEMAKER UPGRADE  05/04/2010  . CARDIAC CATHETERIZATION  2006  . CARDIOVERSION N/A 02/20/2018   Procedure: CARDIOVERSION;  Surgeon: Minna Merritts, MD;  Location: [facility];  Service: Cardiovascular;  Laterality: N/A;  . CATARACT EXTRACTION W/ INTRAOCULAR LENS  IMPLANT, BILATERAL  01/2006-02-2006  . CHOLECYSTECTOMY N/A 11/11/2012   Procedure: LAPAROSCOPIC CHOLECYSTECTOMY WITH INTRAOPERATIVE CHOLANGIOGRAM;  Surgeon: Imogene Burn. Georgette Dover, MD;  Location: [place];  Service: General;  Laterality: N/A;  . EP IMPLANTABLE DEVICE N/A 02/21/2016   Procedure: ICD Generator Changeout;  Surgeon: Deboraha Sprang, MD;  Location: [place] [facility];  Service: Cardiovascular;  Laterality: N/A;  . INSERT / REPLACE / REMOVE PACEMAKER  05-01-11   02-28-05-/05-04-10-ICD-MEDTRONIC MAXIMAL DR  . JOINT REPLACEMENT    . LAPAROSCOPIC CHOLECYSTECTOMY  11/11/2012  . LAPAROSCOPIC LYSIS OF ADHESIONS N/A 11/11/2012   Procedure: LAPAROSCOPIC LYSIS OF ADHESIONS;  Surgeon: Imogene Burn. Georgette Dover, MD;  Location: [place];  Service: General;  Laterality: N/A;  . NEPHRECTOMY Right 06/2001    S/P RENAL CELL CANCER  . TOTAL HIP ARTHROPLASTY Right 05/03/2011   Procedure: TOTAL HIP ARTHROPLASTY ANTERIOR APPROACH;  Surgeon: Mcarthur Rossetti;  Location: [facility];  Service: Orthopedics;  Laterality: Right;  Removal of Cannulated Screws Right Hip, Right Direct Anterior Hip Replacement    Current Outpatient Medications  Medication Sig Dispense Refill  . acetaminophen (TYLENOL) 500 MG tablet Take 1,000 mg by mouth every 6 (six) hours as needed for headache.    Marland Kitchen amiodarone (PACERONE) 200 MG tablet Take 1 tablet (200  mg) by mouth twice daily x 2 weeks, then take 1 tablet (200 mg) by mouth once daily (Patient taking differently: Take 100 mg by mouth 2 (two) times daily. Take 1 tablet (200 mg) by mouth twice daily x 2 weeks, then take 1 tablet (200 mg) by mouth once daily) 60 tablet 0  . carvedilol (COREG) 6.25 MG tablet Take 1 tablet (6.25 mg total) by mouth 2 (two) times daily. (Patient taking differently: Take 3.125 mg by mouth 2 (two) times daily. ) 180 tablet 3  . cetirizine (ZYRTEC) 10 MG tablet Take 10 mg by mouth daily.     Marland Kitchen ezetimibe (ZETIA) 10 MG tablet Take 1 tablet (10 mg total) by mouth daily. 30 tablet 11  . hydrocortisone 2.5 % cream Apply 1 application topically daily as needed for itching.    Marland Kitchen  metolazone (ZAROXOLYN) 5 MG tablet Take 1 tablet (5 mg) by mouth 30 minutes prior to torsemide dose as directed 10 tablet 0  . nitrofurantoin, macrocrystal-monohydrate, (MACROBID) 100 MG capsule Take 1 capsule (100 mg total) by mouth daily. 30 capsule 2  . potassium chloride SA (K-DUR,KLOR-CON) 20 MEQ tablet Take 1 tablet (20 meq) by mouth once daily 30 tablet 0  . rosuvastatin (CRESTOR) 40 MG tablet Take 1 tablet (40 mg total) by mouth daily. 90 tablet 3  . torsemide (DEMADEX) 20 MG tablet Take 2 tablets (40 mg) by mouth once daily (Patient taking differently: Take 2 tablets (40 mg) by mouth once daily as directed.)    . warfarin (COUMADIN) 2 MG tablet Take 1 1/2 tablets daily or AS DIRECTED BY ANTICOAGULATION CLINIC. (Patient taking differently: Take 2 mg by mouth at bedtime. Take 1 1/2 tablets daily or AS DIRECTED BY ANTICOAGULATION CLINIC.) 130 tablet 1   No current facility-administered medications for this visit.     Allergies  Allergen Reactions  . Codeine Nausea And Vomiting  . Morphine And Related Nausea And Vomiting  . Sulfonamide Derivatives Other (See Comments)    Dry mouth    Review of Systems negative except from HPI and PMH  Physical Exam Ht 5' 6.5" (1.689 m)   Wt 187 lb 4 oz (84.9  kg)   BMI 29.77 kg/m  Well developed and nourished in no acute distress HENT normal Neck supple  Clear Regular rate and rhythm, no murmurs or gallops Abd-soft with active BS No Clubbing cyanosis 1-2+ edema Skin-warm and dry A & Oriented  Grossly normal sensory and motor function   ECG V pacing with underlying atrial fib  Assessment and  Plan  Nonischemic cardiomyopathy with interval normalization of LV function  Sinus node dysfunction device dependent   Mitral regurgitation  Complete heart block intermittent  Congestive heart failure chronic/diastolic class IIb-IIIa  Atrial fibrillation/flutter persistent  Amiodarone therapy   Chronic Renal insufficiency grade 3   Dizziness   Implantable defibrillator  Nausea  hypothyroid  She has reverted to atrial fibrillation.  We will repeat cardioversion.  In the interval we will increase her amiodarone 100 twice daily--200 twice daily.  Volume status is improved we will have her increase her torsemide from 20--40 mg daily so as to hopefully avoid interval metolazone.  We will check a metabolic profile today and then again in 2 weeks for her chronic diuretic regime  We will plan a repeat TSH at the same time.  It is important for [place] to recognize that her symptoms are vastly improved not withstanding the persistence of her atrial fibrillation.  She may benefit from repeat echocardiogram  We spent more than 50% of our >25 min visit in face to face counseling regarding the above

## 2018-03-18 LAB — CBC WITH DIFFERENTIAL/PLATELET
Basophils Absolute: 0 10*3/uL (ref 0.0–0.2)
Basos: 1 %
EOS (ABSOLUTE): 0 10*3/uL (ref 0.0–0.4)
Eos: 1 %
Hematocrit: 31.6 % — ABNORMAL LOW (ref 34.0–46.6)
Hemoglobin: 9.4 g/dL — ABNORMAL LOW (ref 11.1–15.9)
Immature Grans (Abs): 0 10*3/uL (ref 0.0–0.1)
Immature Granulocytes: 0 %
Lymphocytes Absolute: 0.7 10*3/uL (ref 0.7–3.1)
Lymphs: 20 %
MCH: 22.4 pg — ABNORMAL LOW (ref 26.6–33.0)
MCHC: 29.7 g/dL — ABNORMAL LOW (ref 31.5–35.7)
MCV: 75 fL — ABNORMAL LOW (ref 79–97)
Monocytes Absolute: 0.5 10*3/uL (ref 0.1–0.9)
Monocytes: 14 %
Neutrophils Absolute: 2.4 10*3/uL (ref 1.4–7.0)
Neutrophils: 64 %
Platelets: 232 10*3/uL (ref 150–450)
RBC: 4.2 x10E6/uL (ref 3.77–5.28)
RDW: 16.1 % — ABNORMAL HIGH (ref 12.3–15.4)
WBC: 3.7 10*3/uL (ref 3.4–10.8)

## 2018-03-18 LAB — BASIC METABOLIC PANEL
BUN/Creatinine Ratio: 15 (ref 12–28)
BUN: 24 mg/dL (ref 8–27)
CO2: 26 mmol/L (ref 20–29)
Calcium: 9.5 mg/dL (ref 8.7–10.3)
Chloride: 101 mmol/L (ref 96–106)
Creatinine, Ser: 1.59 mg/dL — ABNORMAL HIGH (ref 0.57–1.00)
GFR calc Af Amer: 35 mL/min/{1.73_m2} — ABNORMAL LOW (ref >59)
GFR calc non Af Amer: 30 mL/min/{1.73_m2} — ABNORMAL LOW (ref >59)
Glucose: 104 mg/dL — ABNORMAL HIGH (ref 65–99)
Potassium: 4.4 mmol/L (ref 3.5–5.2)
Sodium: 143 mmol/L (ref 134–144)

## 2018-03-19 ENCOUNTER — Other Ambulatory Visit: Payer: Self-pay | Admitting: Internal Medicine

## 2018-03-19 ENCOUNTER — Ambulatory Visit (INDEPENDENT_AMBULATORY_CARE_PROVIDER_SITE_OTHER): Payer: PPO | Admitting: General Practice

## 2018-03-19 DIAGNOSIS — Z7901 Long term (current) use of anticoagulants: Secondary | ICD-10-CM

## 2018-03-19 DIAGNOSIS — Z7902 Long term (current) use of antithrombotics/antiplatelets: Secondary | ICD-10-CM

## 2018-03-19 DIAGNOSIS — Z23 Encounter for immunization: Secondary | ICD-10-CM

## 2018-03-19 LAB — POCT INR: INR: 4.4 — AB (ref 2.0–3.0)

## 2018-03-19 NOTE — Patient Instructions (Addendum)
Pre visit review using our clinic review tool, if applicable. No additional management support is needed unless otherwise documented below in the visit note.  Hold coumadin today and tomorrow (11/14 and 11/15) and then continue to take  1 (2 mg) tablet daily.  Re-check on Tuesday.  Patient is having skin surgery on Wednesday 11/20 and a cardioversion on 11/22.

## 2018-03-24 ENCOUNTER — Ambulatory Visit (INDEPENDENT_AMBULATORY_CARE_PROVIDER_SITE_OTHER): Payer: PPO

## 2018-03-24 ENCOUNTER — Ambulatory Visit: Payer: PPO

## 2018-03-24 DIAGNOSIS — Z7901 Long term (current) use of anticoagulants: Secondary | ICD-10-CM

## 2018-03-24 DIAGNOSIS — Z7902 Long term (current) use of antithrombotics/antiplatelets: Secondary | ICD-10-CM

## 2018-03-24 LAB — POCT INR: INR: 3.5 — AB (ref 2.0–3.0)

## 2018-03-24 NOTE — Patient Instructions (Signed)
INR today 3.5  Hold coumadin today (11/19) and then decrease weekly dose to 1 pill (2mg ) daily EXCEPT for 1/2 pill (1mg ) on sundays.  Recheck in 1 week.  *Note patient is to contact her dermatologist regarding procedure tomorrow and make them aware that she is supratherapeutic and at increase risk for bleeding right now.   Cardiology will go forward with Cardioversion on Friday, however patient is to still make them aware of today's elevated INR reading.  Patient verbalizes understanding of all instructions given today.

## 2018-03-25 DIAGNOSIS — D485 Neoplasm of uncertain behavior of skin: Secondary | ICD-10-CM | POA: Diagnosis not present

## 2018-03-25 DIAGNOSIS — D229 Melanocytic nevi, unspecified: Secondary | ICD-10-CM

## 2018-03-25 DIAGNOSIS — L859 Epidermal thickening, unspecified: Secondary | ICD-10-CM | POA: Diagnosis not present

## 2018-03-25 DIAGNOSIS — D0472 Carcinoma in situ of skin of left lower limb, including hip: Secondary | ICD-10-CM | POA: Diagnosis not present

## 2018-03-25 DIAGNOSIS — D0471 Carcinoma in situ of skin of right lower limb, including hip: Secondary | ICD-10-CM | POA: Diagnosis not present

## 2018-03-25 DIAGNOSIS — D2262 Melanocytic nevi of left upper limb, including shoulder: Secondary | ICD-10-CM | POA: Diagnosis not present

## 2018-03-25 DIAGNOSIS — L570 Actinic keratosis: Secondary | ICD-10-CM | POA: Diagnosis not present

## 2018-03-25 HISTORY — DX: Melanocytic nevi, unspecified: D22.9

## 2018-03-26 ENCOUNTER — Other Ambulatory Visit: Payer: Self-pay | Admitting: Cardiovascular Disease

## 2018-03-26 ENCOUNTER — Encounter: Payer: Self-pay | Admitting: Anesthesiology

## 2018-03-27 ENCOUNTER — Other Ambulatory Visit: Payer: Self-pay

## 2018-03-27 ENCOUNTER — Ambulatory Visit: Payer: PPO | Admitting: Anesthesiology

## 2018-03-27 ENCOUNTER — Encounter
Admission: RE | Disposition: A | Discharge status: Home / Self Care | Payer: Self-pay | Source: Ambulatory Visit | Attending: Cardiovascular Disease

## 2018-03-27 ENCOUNTER — Ambulatory Visit
Admission: RE | Admit: 2018-03-27 | Discharge: 2018-03-27 | Disposition: A | Discharge status: Home / Self Care | Payer: PPO | Source: Ambulatory Visit | Attending: Cardiovascular Disease | Admitting: Cardiovascular Disease

## 2018-03-27 DIAGNOSIS — Z905 Acquired absence of kidney: Secondary | ICD-10-CM | POA: Insufficient documentation

## 2018-03-27 DIAGNOSIS — I495 Sick sinus syndrome: Secondary | ICD-10-CM | POA: Diagnosis not present

## 2018-03-27 DIAGNOSIS — Z79899 Other long term (current) drug therapy: Secondary | ICD-10-CM | POA: Diagnosis not present

## 2018-03-27 DIAGNOSIS — I4819 Other persistent atrial fibrillation: Secondary | ICD-10-CM | POA: Diagnosis not present

## 2018-03-27 DIAGNOSIS — I4892 Unspecified atrial flutter: Secondary | ICD-10-CM | POA: Diagnosis not present

## 2018-03-27 DIAGNOSIS — Z7901 Long term (current) use of anticoagulants: Secondary | ICD-10-CM | POA: Diagnosis not present

## 2018-03-27 DIAGNOSIS — Z95810 Presence of automatic (implantable) cardiac defibrillator: Secondary | ICD-10-CM | POA: Diagnosis not present

## 2018-03-27 DIAGNOSIS — I340 Nonrheumatic mitral (valve) insufficiency: Secondary | ICD-10-CM | POA: Insufficient documentation

## 2018-03-27 DIAGNOSIS — E7800 Pure hypercholesterolemia, unspecified: Secondary | ICD-10-CM | POA: Insufficient documentation

## 2018-03-27 DIAGNOSIS — I130 Hypertensive heart and chronic kidney disease with heart failure and stage 1 through stage 4 chronic kidney disease, or unspecified chronic kidney disease: Secondary | ICD-10-CM | POA: Insufficient documentation

## 2018-03-27 DIAGNOSIS — I5032 Chronic diastolic (congestive) heart failure: Secondary | ICD-10-CM | POA: Insufficient documentation

## 2018-03-27 DIAGNOSIS — Z96641 Presence of right artificial hip joint: Secondary | ICD-10-CM | POA: Diagnosis not present

## 2018-03-27 DIAGNOSIS — I483 Typical atrial flutter: Secondary | ICD-10-CM

## 2018-03-27 DIAGNOSIS — Z85528 Personal history of other malignant neoplasm of kidney: Secondary | ICD-10-CM | POA: Insufficient documentation

## 2018-03-27 DIAGNOSIS — I428 Other cardiomyopathies: Secondary | ICD-10-CM | POA: Insufficient documentation

## 2018-03-27 DIAGNOSIS — I4821 Permanent atrial fibrillation: Secondary | ICD-10-CM | POA: Diagnosis not present

## 2018-03-27 DIAGNOSIS — J449 Chronic obstructive pulmonary disease, unspecified: Secondary | ICD-10-CM | POA: Diagnosis not present

## 2018-03-27 DIAGNOSIS — N183 Chronic kidney disease, stage 3 (moderate): Secondary | ICD-10-CM | POA: Diagnosis not present

## 2018-03-27 DIAGNOSIS — I4891 Unspecified atrial fibrillation: Secondary | ICD-10-CM | POA: Diagnosis not present

## 2018-03-27 DIAGNOSIS — E039 Hypothyroidism, unspecified: Secondary | ICD-10-CM | POA: Insufficient documentation

## 2018-03-27 DIAGNOSIS — N184 Chronic kidney disease, stage 4 (severe): Secondary | ICD-10-CM | POA: Diagnosis not present

## 2018-03-27 HISTORY — PX: CARDIOVERSION: EP1203

## 2018-03-27 SURGERY — CARDIOVERSION (CATH LAB)
Anesthesia: General

## 2018-03-27 MED ORDER — PHENYLEPHRINE HCL 10 MG/ML IJ SOLN
INTRAMUSCULAR | Status: AC
  Filled 2018-03-27: qty 1

## 2018-03-27 MED ORDER — PROPOFOL 10 MG/ML IV BOLUS
INTRAVENOUS | Status: DC | PRN
  Administered 2018-03-27: 50 mg via INTRAVENOUS

## 2018-03-27 MED ORDER — PROPOFOL 10 MG/ML IV BOLUS
INTRAVENOUS | Status: AC
  Filled 2018-03-27: qty 20

## 2018-03-27 NOTE — Anesthesia Postprocedure Evaluation (Signed)
Anesthesia Post Note  Patient: [name]  Procedure(s) Performed: CARDIOVERSION (N/A )  Patient location during evaluation: Cath Lab Anesthesia Type: General Level of consciousness: awake and alert Pain management: pain level controlled Vital Signs Assessment: post-procedure vital signs reviewed and stable Respiratory status: spontaneous breathing, nonlabored ventilation, respiratory function stable and patient connected to nasal cannula oxygen Cardiovascular status: blood pressure returned to baseline and stable Postop Assessment: no apparent nausea or vomiting Anesthetic complications: no     Last Vitals:  Vitals:   03/27/18 0746 03/27/18 0747  BP: (!) 128/56 (!) 128/56  Pulse: 63 77  Resp: (!) 21 19  Temp:    SpO2: 95% 97%    Last Pain:  Vitals:   03/27/18 0704  TempSrc: Oral  PainSc: 0-No pain                 [name]

## 2018-03-27 NOTE — Anesthesia Preprocedure Evaluation (Signed)
Anesthesia Evaluation  Patient identified by MRN, date of birth, ID band Patient awake    Reviewed: Allergy & Precautions, NPO status , Patient's Chart, lab work & pertinent test results, reviewed documented beta blocker date and time   History of Anesthesia Complications (+) PONV and history of anesthetic complications  Airway Mallampati: II  TM Distance: >3 FB     Dental  (+) Chipped, Upper Dentures, Partial Lower, Missing   Pulmonary COPD, former smoker,           Cardiovascular hypertension, Pt. on medications and Pt. on home beta blockers + angina + CAD, + Peripheral Vascular Disease and +CHF  + dysrhythmias Atrial Fibrillation + Cardiac Defibrillator      Neuro/Psych PSYCHIATRIC DISORDERS Anxiety TIA   GI/Hepatic   Endo/Other    Renal/GU Renal disease     Musculoskeletal  (+) Arthritis ,   Abdominal   Peds  Hematology  (+) anemia ,   Anesthesia Other Findings MV regurg. Renal ca. EF 60-65 2 yr ago.  Reproductive/Obstetrics                             Anesthesia Physical Anesthesia Plan  ASA: III  Anesthesia Plan: General   Post-op Pain Management:    Induction: Intravenous  PONV Risk Score and Plan:   Airway Management Planned:   Additional Equipment:   Intra-op Plan:   Post-operative Plan:   Informed Consent: I have reviewed the patients History and Physical, chart, labs and discussed the procedure including the risks, benefits and alternatives for the proposed anesthesia with the patient or authorized representative who has indicated his/her understanding and acceptance.     Plan Discussed with: CRNA  Anesthesia Plan Comments:         Anesthesia Quick Evaluation

## 2018-03-27 NOTE — Anesthesia Post-op Follow-up Note (Signed)
Anesthesia QCDR form completed.        

## 2018-03-27 NOTE — CV Procedure (Signed)
Cardioversion procedure note For atrial flutter/fib  Procedure Details:  Consent: Risks of procedure as well as the alternatives and risks of each were explained to the (patient/caregiver). Consent for procedure obtained.  Time Out: Verified patient identification, verified procedure, site/side was marked, verified correct patient position, special equipment/implants available, medications/allergies/relevent history reviewed, required imaging and test results available. Performed  Patient placed on cardiac monitor, pulse oximetry, supplemental oxygen as necessary.  Sedation given: 50  propofol IV, Dr. Marcello Moores Pacer pads placed anterior and posterior chest.   Cardioverted 1 time(s).  Cardioverted at  150 J. Synchronized biphasic Converted to NSR   Evaluation: Findings: Post procedure EKG shows: NSR Complications: None Patient did tolerate procedure well.  Time Spent Directly with the Patient:  15 minutes   Esmond Plants, M.D., Ph.D.

## 2018-03-27 NOTE — Transfer of Care (Signed)
Immediate Anesthesia Transfer of Care Note  Patient: [name]  Procedure(s) Performed: CARDIOVERSION (N/A )  Patient Location: PACU  Anesthesia Type:General  Level of Consciousness: drowsy and responds to stimulation  Airway & Oxygen Therapy: Patient Spontanous Breathing and Patient connected to nasal cannula oxygen  Post-op Assessment: Report given to RN and Post -op Vital signs reviewed and stable  Post vital signs: Reviewed and stable  Last Vitals:  Vitals Value Taken Time  BP 128/56 03/27/2018  7:47 AM  Temp    Pulse 61 03/27/2018  7:47 AM  Resp 18 03/27/2018  7:47 AM  SpO2 97 % 03/27/2018  7:47 AM  Vitals shown include unvalidated device data.  Last Pain:  Vitals:   03/27/18 0704  TempSrc: Oral  PainSc: 0-No pain         Complications: No apparent anesthesia complications

## 2018-03-28 NOTE — H&P (Signed)
H&P Addendum, pre-cardioversion For atrial fib/flutter  Patient was seen and evaluated prior to -cardioversion procedure Symptoms, prior testing details again confirmed with the patient Patient examined, no significant change from prior exam Lab work reviewed in detail personally by myself Patient understands risk and benefit of the procedure, willing to proceed  Signed, Esmond Plants, MD, Ph.D [facility]

## 2018-03-29 ENCOUNTER — Encounter: Payer: Self-pay | Admitting: Cardiovascular Disease

## 2018-03-29 NOTE — Progress Notes (Signed)
I have reviewed this visit and I agree on the patient's plan of dosage and recommendations. Juliya Magill B Seira Cody, FNP   

## 2018-03-30 ENCOUNTER — Telehealth: Payer: Self-pay | Admitting: Internal Medicine

## 2018-03-30 ENCOUNTER — Encounter: Payer: PPO | Admitting: Family Medicine

## 2018-03-30 NOTE — Telephone Encounter (Signed)
Remote transmission reviewed. Presenting rhythm: ApVp. 74.8% AT/AF burden, last 03/28/18 @ 0604 x 12 sec. No ventricular arrhythmias recorded. Stable thoracic impedance. 25.1%Ap/99.6%Vp. Stable lead measurements. Normal device function.   Will forward to Alvis Lemmings, RN and Dr.Klein.

## 2018-03-30 NOTE — Telephone Encounter (Signed)
Pt states she had a cardioversion on Friday and her medication, Amiodarone.  She states she cannot walk without having to stop and rest, and he head feels like it is going to "explode".  States her vision is blurred, she is coughing and itching. Patient feels as if this medication is causing this.  Pt c/o medication issue:  1. Name of Medication: Amiodarone  2. How are you currently taking this medication (dosage and times per day)? 200 mg dailey  3. Are you having a reaction (difficulty breathing--STAT)? See above.   4. What is your medication issue? Side effects

## 2018-03-30 NOTE — Telephone Encounter (Signed)
I spoke with Dr. Caryl Comes regarding the patient's symptoms.  Per Dr. Caryl Comes: 1) decrease amiodarone to 200 mg- take 1/2 tablet (100 mg) BID. 2) send in a transmission to confirm if still in rhythm or not  I have notified the patient of the above recommendations.  She voices understanding of all of the above and will send in remote transmission.  Will forward a copy of this note to the [facility] to please review remote when available.   The patient is aware I will call her back tomorrow with further instructions and is agreeable.

## 2018-03-30 NOTE — Telephone Encounter (Signed)
I spoke with the patient. She was DCCV'ed on Friday 11/22. She is currently on amiodarone 200 mg BID. She has previously decreased the dose of amiodarone to 100 mg BID due to nausea and not feeling well in general. However, she saw Dr. Caryl Comes on 03/17/18 and was back in a-fib.  She was DCCV'ed on 02/21/18.  Dr. Caryl Comes had the patient increase amiodarone back up to 200 mg BID in preparation for DCCV.  She calls today stating she feels like her head is going to "explode" and if she stands for 5 minutes she will pass out.  She states she felt like this prior to her DCCV on 11/22. She had similar symptoms on the lower dose amiodarone, but has tried to push through how she feels so she could get her DCCV.  She states Dr. Rockey Situ told her on Friday to maintain amidorone 200 mg BID until next week. I have advised the patient I will review with Dr. Caryl Comes and call her back.   She is agreeable and voices understanding.

## 2018-03-31 NOTE — Telephone Encounter (Signed)
I spoke with the patient. She is aware I have reviewed her device transmission summary with Dr. Caryl Comes as below.  Per Dr. Caryl Comes, he would like the patient to maintain on amiodarone 100 mg BID if possible until seen on 04/07/18.  The patient states "I just don't think I can do that, I feel so bad." Discussed quickly with Dr. Caryl Comes- per MD, ok to stop amiodarone and follow up with the patient in clinic on 04/07/18 as scheduled.   I have advised the patient that per Dr. Caryl Comes, she may go ahead and stop amiodarone. She is concerned stating "I don't want to do something against what Dr. Caryl Comes recommended." I advised the patient that if she is feeling this bad, she will need to go ahead a stop her amiodarone and let's see how she is doing next week. She is aware she may potentially go back in a-fib, but we will address an alternative medication when she is back in the office next week.  She is also aware to continue to monitor her weights as well.  She is due to follow up with her PCP for an INR check tomorrow- I have advised her to notify the nurse checking her INR that her amiodarone has been stopped.   The patient is agreeable with the above and voices understanding.

## 2018-04-01 ENCOUNTER — Ambulatory Visit (INDEPENDENT_AMBULATORY_CARE_PROVIDER_SITE_OTHER): Payer: PPO | Admitting: Family Medicine

## 2018-04-01 ENCOUNTER — Ambulatory Visit (INDEPENDENT_AMBULATORY_CARE_PROVIDER_SITE_OTHER): Payer: PPO

## 2018-04-01 ENCOUNTER — Encounter: Payer: Self-pay | Admitting: Family Medicine

## 2018-04-01 VITALS — BP 112/66 | HR 78 | Temp 97.4°F | Ht 66.0 in | Wt 186.5 lb

## 2018-04-01 DIAGNOSIS — Z7901 Long term (current) use of anticoagulants: Secondary | ICD-10-CM | POA: Diagnosis not present

## 2018-04-01 DIAGNOSIS — R42 Dizziness and giddiness: Secondary | ICD-10-CM | POA: Diagnosis not present

## 2018-04-01 DIAGNOSIS — R300 Dysuria: Secondary | ICD-10-CM

## 2018-04-01 DIAGNOSIS — N184 Chronic kidney disease, stage 4 (severe): Secondary | ICD-10-CM | POA: Diagnosis not present

## 2018-04-01 DIAGNOSIS — N390 Urinary tract infection, site not specified: Secondary | ICD-10-CM

## 2018-04-01 DIAGNOSIS — Z7902 Long term (current) use of antithrombotics/antiplatelets: Secondary | ICD-10-CM

## 2018-04-01 LAB — POC URINALSYSI DIPSTICK (AUTOMATED)
Bilirubin, UA: NEGATIVE
Blood, UA: 50
Glucose, UA: NEGATIVE
Ketones, UA: NEGATIVE
Nitrite, UA: POSITIVE
Protein, UA: NEGATIVE
Spec Grav, UA: 1.015 (ref 1.010–1.025)
Urobilinogen, UA: 0.2 E.U./dL
pH, UA: 6 (ref 5.0–8.0)

## 2018-04-01 LAB — POCT INR: INR: 3.7 — AB (ref 2.0–3.0)

## 2018-04-01 MED ORDER — CEPHALEXIN 500 MG PO CAPS: 500.0000 mg | ORAL_CAPSULE | Freq: Two times a day (BID) | ORAL | 0 refills | Status: DC

## 2018-04-01 NOTE — Assessment & Plan Note (Signed)
With recurrent uti  Enc fluids Rev last renal panel tx with keflex for uti today

## 2018-04-01 NOTE — Patient Instructions (Signed)
INR today 3.7  Hold coumadin today (11/27) and then decrease weekly dose to 1 pill (2mg ) daily EXCEPT for 1/2 pill (1mg ) on sundays and Thursdays now.  Recheck in 1 week.  *Note patient has started keflex today for UTI.  Keflex typically does not interact with coumadin.  Will continue to monitor patient carefully.    Patient is aware of risks associated with supratherpeutic reading and will go to ER if any concerns develop.

## 2018-04-01 NOTE — Assessment & Plan Note (Signed)
Chronic dizziness- was not able to make her neurology appt (ref was done in May) due to many other health problems She should be able to go now- strongly enc her to re schedule that  Her symptoms may be further exacerbated by uti today as well  Reassuring exam  Instructed however to call/seek care if suddenly worse or any headache /fever or stroke symptoms

## 2018-04-01 NOTE — Progress Notes (Signed)
Subjective:    Patient ID: [name], female    DOB: 1933/05/15, 84 y.o.   MRN: [id number]  HPI  84 yo pt of NP [name] here with urinary symptoms and also malaise and dizziness    Takes coumadin for a fib (2 cardioversions) --did better after 2nd one and no longer on amiodarone which is great  Also recent skin cancer -tx  Also a fall and visit to ortho  Due for INR today-it is high (working with dose with Leafy Ro) Lab Results  Component Value Date   INR 3.7 (A) 04/01/2018   INR 3.5 (A) 03/24/2018   INR 4.4 (A) 03/19/2018   Also hx of CKD stage 4 Lab Results  Component Value Date   CREATININE 1.59 (H) 03/17/2018   BUN 24 03/17/2018   NA 143 03/17/2018   K 4.4 03/17/2018   CL 101 03/17/2018   CO2 26 03/17/2018   Hx of recurrent uti   (last ucx e coli 9/24)-pan sensitive (tx with keflex and then proph macrobid by her urologist)  Pos ua today   Results for orders placed or performed in visit on 04/01/18  POCT Urinalysis Dipstick (Automated)  Result Value Ref Range   Color, UA Amber    Clarity, UA Hazy    Glucose, UA Negative Negative   Bilirubin, UA Negative    Ketones, UA Negative    Spec Grav, UA 1.015 1.010 - 1.025   Blood, UA 50 Ery/uL    pH, UA 6.0 5.0 - 8.0   Protein, UA Negative Negative   Urobilinogen, UA 0.2 0.2 or 1.0 E.U./dL   Nitrite, UA Positive    Leukocytes, UA Moderate (2+) (A) Negative     Burning to urinate  Saw a little blood in urine (small amt)  Odor to urine  Some pain over bladder   Also very long hx of episodic dizziness  Worse lately- ? If due to uti She was ref to neurology in May-was unable to go due to other health problems (cancelled appt)  Thinks she has time to re schedule that now    Patient Active Problem List   Diagnosis Date Noted  . Urinary frequency 09/18/2017  . Iliac artery stenosis, bilateral ([place]) 07/14/2017  . Recurrent UTI 07/14/2017  . Long term (current) use of anticoagulants 05/08/2017  . Microcytic  anemia 11/18/2016  . Dysuria 11/18/2016  . Pulmonary hypertension ([place]) 12/04/2015  . B12 deficiency 05/22/2015  . PAD (peripheral artery disease) ([place])   . Elevated serum creatinine   . Renal cell carcinoma ([place])   . S/p nephrectomy   . TIA (transient ischemic attack) 08/16/2014  . Weakness 01/14/2014  . Chronic kidney disease (CKD), stage IV (severe) ([place]) 12/08/2013  . Encounter for long-term (current) use of antiplatelets/antithrombotics 06/07/2013  . Chronic diastolic CHF (congestive heart failure) ([place]) 06/04/2013  . Mitral valve regurgitation 06/04/2013  . Allergic rhinitis 01/01/2013  . Chronic cholecystitis with calculus 09/15/2012  . Claudication ([place]) 01/21/2012  . Dizziness 11/26/2011  . Hypertension 05/20/2011  . Avascular necrosis of hip ([place]) 05/03/2011  . OTHER AND UNSPECIFIED COAGULATION DEFECTS 10/13/2009  . RECTAL BLEEDING 10/13/2009  . HOCM / IHSS 07/14/2009  . EDEMA 07/14/2009  . Hyperlipidemia 04/10/2009  . ADJUSTMENT DISORDER WITH ANXIETY 04/10/2009  . INCONTINENCE, URGE 04/10/2009  . Atrial fibrillation ([place]) 10/27/2008  . SINUS BRADYCARDIA 10/27/2008  . Carotid artery stenosis 10/27/2008  . COPD (chronic obstructive pulmonary disease) ([place]) 10/27/2008  . Implantable cardioverter-defibrillator (ICD) in situ 10/27/2008  Past Medical History:  Diagnosis Date  . Adjustment disorder with anxiety   . Anginal pain ([place])   . Arthritis    "some in my hands" (11/11/2012)  . Atrial fibrillation ([place])   . Automatic implantable cardioverter-defibrillator in situ   . Blood in stool   . Carotid artery stenosis 09/2007   60-79% bilateral (stable)  . Carotid artery stenosis 10/2008   40-59% R 60-79%   . Chest pain, unspecified   . Chronic airway obstruction, not elsewhere classified   . Chronic bronchitis ([place])    "get it some; not q year" (11/11/2012)  . Chronic diastolic CHF (congestive heart failure) ([place]) 06/04/2013  . Chronic kidney disease, unspecified   .  Coronary artery disease    non-obstructive by 2006 cath  . Dysuria   . Edema   . Encounter for therapeutic drug monitoring   . Exertional shortness of breath   . Frequent UTI    "get them a couple times/yr" (11/11/2012)  . Hemorrhage of rectum and anus   . High cholesterol   . History of blood transfusion 04/2011   "after hip OR" (11/11/2012)  . Hypertension 05/20/2011  . Hypertr obst cardiomyop   . Hypotension, unspecified    cardiac cath 2006..nonobstructive CAD 30-40s lesions.Marland KitchenETT 1/09 nondiagnostic due to poor HR response..Right Renal Cancer 2003  . Long term (current) use of anticoagulants   . Malignant neoplasm of kidney, except pelvis   . Osteoarthritis of right hip   . Other acute sinusitis   . Other and unspecified coagulation defects   . Other malaise and fatigue   . Other screening mammogram   . PONV (postoperative nausea and vomiting)   . Pre-operative cardiovascular examination   . Renal cancer ([place]) 06/2001   Right  . Secondary cardiomyopathy, unspecified   . Sinus bradycardia   . Special screening for osteoporosis   . Urge incontinence    Past Surgical History:  Procedure Laterality Date  . ABDOMINAL HYSTERECTOMY  1975   for benign causes  . APPENDECTOMY    . BI-VENTRICULAR PACEMAKER UPGRADE  05/04/2010  . CARDIAC CATHETERIZATION  2006  . CARDIOVERSION N/A 02/20/2018   Procedure: CARDIOVERSION;  Surgeon: Minna Merritts, MD;  Location: [facility];  Service: Cardiovascular;  Laterality: N/A;  . CARDIOVERSION N/A 03/27/2018   Procedure: CARDIOVERSION;  Surgeon: Minna Merritts, MD;  Location: [facility];  Service: Cardiovascular;  Laterality: N/A;  . CATARACT EXTRACTION W/ INTRAOCULAR LENS  IMPLANT, BILATERAL  01/2006-02-2006  . CHOLECYSTECTOMY N/A 11/11/2012   Procedure: LAPAROSCOPIC CHOLECYSTECTOMY WITH INTRAOPERATIVE CHOLANGIOGRAM;  Surgeon: Imogene Burn. Georgette Dover, MD;  Location: [facility];  Service: General;  Laterality: N/A;  . EP IMPLANTABLE DEVICE N/A 02/21/2016    Procedure: ICD Generator Changeout;  Surgeon: Deboraha Sprang, MD;  Location: [facility];  Service: Cardiovascular;  Laterality: N/A;  . INSERT / REPLACE / REMOVE PACEMAKER  05-01-11   02-28-05-/05-04-10-ICD-MEDTRONIC MAXIMAL DR  . JOINT REPLACEMENT    . LAPAROSCOPIC CHOLECYSTECTOMY  11/11/2012  . LAPAROSCOPIC LYSIS OF ADHESIONS N/A 11/11/2012   Procedure: LAPAROSCOPIC LYSIS OF ADHESIONS;  Surgeon: Imogene Burn. Georgette Dover, MD;  Location: [facility];  Service: General;  Laterality: N/A;  . NEPHRECTOMY Right 06/2001    S/P RENAL CELL CANCER  . TOTAL HIP ARTHROPLASTY Right 05/03/2011   Procedure: TOTAL HIP ARTHROPLASTY ANTERIOR APPROACH;  Surgeon: Mcarthur Rossetti;  Location: [facility];  Service: Orthopedics;  Laterality: Right;  Removal of Cannulated Screws Right Hip, Right Direct Anterior Hip Replacement  Social History   Tobacco Use  . Smoking status: Former Smoker    Packs/day: 0.50    Years: 40.00    Pack years: 20.00    Types: Cigarettes    Last attempt to quit: 05/06/2001    Years since quitting: 16.9  . Smokeless tobacco: Never Used  Substance Use Topics  . Alcohol use: No    Alcohol/week: 0.0 standard drinks  . Drug use: No   Family History  Problem Relation Age of Onset  . Heart failure Mother   . Breast cancer Maternal Aunt 82  . Breast cancer Cousin   . Breast cancer Other   . Colon cancer Neg Hx    Allergies  Allergen Reactions  . Codeine Nausea And Vomiting  . Morphine And Related Nausea And Vomiting  . Sulfonamide Derivatives Other (See Comments)    Dry mouth   Current Outpatient Medications on File Prior to Visit  Medication Sig Dispense Refill  . acetaminophen (TYLENOL) 500 MG tablet Take 1,000 mg by mouth daily as needed for headache.     . carvedilol (COREG) 6.25 MG tablet Take 1 tablet (6.25 mg total) by mouth 2 (two) times daily. 180 tablet 3  . cetirizine (ZYRTEC) 10 MG tablet Take 10 mg by mouth daily.     Marland Kitchen CRANBERRY PO Take 4 tablets by mouth daily.    Marland Kitchen  ezetimibe (ZETIA) 10 MG tablet Take 1 tablet (10 mg total) by mouth daily. 30 tablet 11  . hydrocortisone 2.5 % cream Apply 1 application topically daily as needed (itching).     . metolazone (ZAROXOLYN) 5 MG tablet Take 1 tablet (5 mg) by mouth 30 minutes prior to torsemide dose as directed (Patient taking differently: Take 5 mg by mouth See admin instructions. Only take when directed by dr) 10 tablet 0  . mupirocin ointment (BACTROBAN) 2 % Place 1 application into the nose daily.    . potassium chloride SA (K-DUR,KLOR-CON) 20 MEQ tablet Take 1 tablet (20 meq) by mouth once daily 30 tablet 0  . rosuvastatin (CRESTOR) 40 MG tablet Take 1 tablet (40 mg total) by mouth daily. 90 tablet 3  . torsemide (DEMADEX) 20 MG tablet Take 2 tablets (40 mg) by mouth once daily (Patient taking differently: Take 20-40 mg by mouth See admin instructions. Take 20 mg daily, may take a second 20 mg dose as needed for swelling)    . warfarin (COUMADIN) 2 MG tablet Take 1 1/2 tablets daily or AS DIRECTED BY ANTICOAGULATION CLINIC. (Patient taking differently: Take 2 mg by mouth every evening. ) 130 tablet 1   No current facility-administered medications on file prior to visit.     Review of Systems  Constitutional: Positive for fatigue. Negative for activity change, appetite change, chills and fever.       Fatigue and gen malaise   HENT: Negative for congestion and sore throat.   Eyes: Negative for itching and visual disturbance.  Respiratory: Negative for cough and shortness of breath.   Cardiovascular: Negative for leg swelling.       Does not feel like she is in afib currently   Gastrointestinal: Negative for abdominal distention, abdominal pain, constipation, diarrhea and nausea.  Endocrine: Negative for cold intolerance and polydipsia.  Genitourinary: Positive for dysuria, frequency, hematuria and urgency. Negative for difficulty urinating and flank pain.       Small amt of blood in urine once  freq and  urgency and odor in urine   Musculoskeletal: Negative for  myalgias.  Skin: Negative for rash.  Allergic/Immunologic: Negative for immunocompromised state.  Neurological: Positive for dizziness. Negative for tremors, seizures, syncope, facial asymmetry, speech difficulty, weakness, light-headedness, numbness and headaches.       Chronic dizziness- worse than usual today  Hematological: Negative for adenopathy.  Psychiatric/Behavioral:       Stressors  Many health problems        Objective:   Physical Exam  Constitutional: She appears well-developed and well-nourished. No distress.  Well but generally fatigued appearing   HENT:  Head: Normocephalic and atraumatic.  Mouth/Throat: Oropharynx is clear and moist.  Eyes: Pupils are equal, round, and reactive to light. Conjunctivae and EOM are normal.  No nystagmus   Neck: Normal range of motion. Neck supple.  Cardiovascular: Normal rate, regular rhythm and normal heart sounds.  Regular rhythm   Pulmonary/Chest: Effort normal and breath sounds normal. No stridor. No respiratory distress. She has no wheezes. She has no rales.  Good air exch Diffusely distant bs   Abdominal: Soft. Bowel sounds are normal. She exhibits no distension and no mass. There is tenderness. There is no rebound.  No cva tenderness  Very mild  suprapubic tenderness  (exam done sitting up)  Musculoskeletal: She exhibits no edema or deformity.  Lymphadenopathy:    She has no cervical adenopathy.  Neurological: She is alert. She displays normal reflexes. No cranial nerve deficit. She exhibits normal muscle tone. Coordination normal.  Not ataxic    Skin: No rash noted. No erythema. No pallor.  Psychiatric: She has a normal mood and affect.  Mildly anxious but pleasant   Voices feeling lousy in general           Assessment & Plan:   Problem List Items Addressed This Visit      Genitourinary   Recurrent UTI - Primary    Acute on chronic today  Nitrite  and leukocytes on ua  Taking proph macrobid from urology (estrogen cream did not help) Drinking fluids Will tx with keflex 500 mg bid while waiting for cx  Lab Results  Component Value Date   CREATININE 1.59 (H) 03/17/2018   Will alert when cx returns inst her to call and seek care if symptoms suddenly worsen over holiday weekend Also setting up appt with PCP to touch base      Relevant Medications   cephALEXin (KEFLEX) 500 MG capsule   Other Relevant Orders   Urine Culture   Chronic kidney disease (CKD), stage IV (severe) (HCC)    With recurrent uti  Enc fluids Rev last renal panel tx with keflex for uti today        Other   Dysuria   Relevant Orders   POCT Urinalysis Dipstick (Automated) (Completed)   Dizziness    Chronic dizziness- was not able to make her neurology appt (ref was done in May) due to many other health problems She should be able to go now- strongly enc her to re schedule that  Her symptoms may be further exacerbated by uti today as well  Reassuring exam  Instructed however to call/seek care if suddenly worse or any headache /fever or stroke symptoms

## 2018-04-01 NOTE — Patient Instructions (Addendum)
Continue drinking lots of fluids and rest   Take the keflex for uti  We will culture urine and call you with a result Follow up with urology as planned (earlier if not improving)   Update if not starting to improve in a week or if worsening   If severe-go to the ER   Do re schedule your urology appointment for chronic dizziness as well   Follow up with Tor Netters in December

## 2018-04-01 NOTE — Assessment & Plan Note (Signed)
Acute on chronic today  Nitrite and leukocytes on ua  Taking proph macrobid from urology (estrogen cream did not help) Drinking fluids Will tx with keflex 500 mg bid while waiting for cx  Lab Results  Component Value Date   CREATININE 1.59 (H) 03/17/2018   Will alert when cx returns inst her to call and seek care if symptoms suddenly worsen over holiday weekend Also setting up appt with PCP to touch base

## 2018-04-03 LAB — URINE CULTURE
MICRO NUMBER:: 91431203
SPECIMEN QUALITY:: ADEQUATE

## 2018-04-06 ENCOUNTER — Other Ambulatory Visit: Payer: Self-pay

## 2018-04-06 ENCOUNTER — Encounter (HOSPITAL_COMMUNITY): Payer: Self-pay | Admitting: *Deleted

## 2018-04-06 ENCOUNTER — Emergency Department (HOSPITAL_COMMUNITY)
Admission: EM | Admit: 2018-04-06 | Discharge: 2018-04-06 | Disposition: A | Discharge status: Home / Self Care | Payer: PPO | Attending: Emergency Medicine | Admitting: Emergency Medicine

## 2018-04-06 ENCOUNTER — Emergency Department (HOSPITAL_COMMUNITY): Payer: PPO

## 2018-04-06 DIAGNOSIS — Z7901 Long term (current) use of anticoagulants: Secondary | ICD-10-CM | POA: Diagnosis not present

## 2018-04-06 DIAGNOSIS — Z79899 Other long term (current) drug therapy: Secondary | ICD-10-CM | POA: Diagnosis not present

## 2018-04-06 DIAGNOSIS — I4891 Unspecified atrial fibrillation: Secondary | ICD-10-CM | POA: Diagnosis not present

## 2018-04-06 DIAGNOSIS — I10 Essential (primary) hypertension: Secondary | ICD-10-CM | POA: Diagnosis not present

## 2018-04-06 DIAGNOSIS — R0602 Shortness of breath: Secondary | ICD-10-CM | POA: Diagnosis not present

## 2018-04-06 DIAGNOSIS — I2510 Atherosclerotic heart disease of native coronary artery without angina pectoris: Secondary | ICD-10-CM | POA: Diagnosis not present

## 2018-04-06 DIAGNOSIS — R42 Dizziness and giddiness: Secondary | ICD-10-CM | POA: Insufficient documentation

## 2018-04-06 DIAGNOSIS — I509 Heart failure, unspecified: Secondary | ICD-10-CM | POA: Insufficient documentation

## 2018-04-06 DIAGNOSIS — R531 Weakness: Secondary | ICD-10-CM | POA: Insufficient documentation

## 2018-04-06 DIAGNOSIS — R0600 Dyspnea, unspecified: Secondary | ICD-10-CM | POA: Diagnosis not present

## 2018-04-06 DIAGNOSIS — R079 Chest pain, unspecified: Secondary | ICD-10-CM | POA: Diagnosis not present

## 2018-04-06 LAB — BASIC METABOLIC PANEL
Anion gap: 14 (ref 5–15)
BUN: 28 mg/dL — ABNORMAL HIGH (ref 8–23)
CO2: 27 mmol/L (ref 22–32)
Calcium: 8.9 mg/dL (ref 8.9–10.3)
Chloride: 98 mmol/L (ref 98–111)
Creatinine, Ser: 1.68 mg/dL — ABNORMAL HIGH (ref 0.44–1.00)
GFR calc Af Amer: 32 mL/min — ABNORMAL LOW (ref >60)
GFR calc non Af Amer: 28 mL/min — ABNORMAL LOW (ref >60)
Glucose, Bld: 95 mg/dL (ref 70–99)
Potassium: 3.5 mmol/L (ref 3.5–5.1)
Sodium: 139 mmol/L (ref 135–145)

## 2018-04-06 LAB — CBC
HCT: 30.8 % — ABNORMAL LOW (ref 36.0–46.0)
Hemoglobin: 8.7 g/dL — ABNORMAL LOW (ref 12.0–15.0)
MCH: 21.3 pg — ABNORMAL LOW (ref 26.0–34.0)
MCHC: 28.2 g/dL — ABNORMAL LOW (ref 30.0–36.0)
MCV: 75.3 fL — ABNORMAL LOW (ref 80.0–100.0)
Platelets: 266 10*3/uL (ref 150–400)
RBC: 4.09 MIL/uL (ref 3.87–5.11)
RDW: 17.4 % — ABNORMAL HIGH (ref 11.5–15.5)
WBC: 5 10*3/uL (ref 4.0–10.5)
nRBC: 0 % (ref 0.0–0.2)

## 2018-04-06 LAB — I-STAT TROPONIN, ED: Troponin i, poc: 0.01 ng/mL (ref 0.00–0.08)

## 2018-04-06 NOTE — ED Notes (Signed)
ED Provider at bedside. 

## 2018-04-06 NOTE — ED Triage Notes (Addendum)
Pt arrives via POV with c/o feeling weak, shortness of breath. She says since she woke up this morning she has had some dizziness, bilateral blurry vision in her eyes and a headache. She feels like she is going to pass out if she stands up to long. Reports she has a history of afib and has had cardioversion in the past three weeks for the same. Pt also reports she is currently being treated for a UTI.

## 2018-04-06 NOTE — ED Triage Notes (Signed)
Pt granddaughter is now at bedside and says the patient has had weakness over the past two weeks, just worse today.

## 2018-04-06 NOTE — ED Provider Notes (Signed)
[facility] EMERGENCY DEPARTMENT Provider Note   CSN: [id number] Arrival date & time: 04/06/18  4098     History   Chief Complaint Chief Complaint  Patient presents with  . Dizziness    HPI [name] is a 84 y.o. female.  HPI   Patient is an 84 year old female with a history of A. fib, CAD, CHF, CKD, hyperlipidemia, hypertension, who presents the emergency department today for evaluation of generalized weakness, shortness of breath and dyspnea on exertion.  Patient states that she has had generalized weakness, shortness of breath and dyspnea on exertion for some time but feels like her symptoms have worsened over the last several days.  She also reports lower extremity swelling that is at baseline.  States she has chronic intermittent chest pain that is unchanged from prior.  No current chest pain.  States she is currently being treated for urinary tract infection.  Denies unilateral numbness or weakness.  Denies dizziness or reported vertiginous symptoms.  Thought she had blurred vision when she woke up this morning but this is since resolved.  She has been ambulatory at home.  Past Medical History:  Diagnosis Date  . Adjustment disorder with anxiety   . Anginal pain ([place])   . Arthritis    "some in my hands" (11/11/2012)  . Atrial fibrillation ([place])   . Automatic implantable cardioverter-defibrillator in situ   . Blood in stool   . Carotid artery stenosis 09/2007   60-79% bilateral (stable)  . Carotid artery stenosis 10/2008   40-59% R 60-79%   . Chest pain, unspecified   . Chronic airway obstruction, not elsewhere classified   . Chronic bronchitis ([place])    "get it some; not q year" (11/11/2012)  . Chronic diastolic CHF (congestive heart failure) ([place]) 06/04/2013  . Chronic kidney disease, unspecified   . Coronary artery disease    non-obstructive by 2006 cath  . Dysuria   . Edema   . Encounter for therapeutic drug monitoring   . Exertional shortness  of breath   . Frequent UTI    "get them a couple times/yr" (11/11/2012)  . Hemorrhage of rectum and anus   . High cholesterol   . History of blood transfusion 04/2011   "after hip OR" (11/11/2012)  . Hypertension 05/20/2011  . Hypertr obst cardiomyop   . Hypotension, unspecified    cardiac cath 2006..nonobstructive CAD 30-40s lesions.Marland KitchenETT 1/09 nondiagnostic due to poor HR response..Right Renal Cancer 2003  . Long term (current) use of anticoagulants   . Malignant neoplasm of kidney, except pelvis   . Osteoarthritis of right hip   . Other acute sinusitis   . Other and unspecified coagulation defects   . Other malaise and fatigue   . Other screening mammogram   . PONV (postoperative nausea and vomiting)   . Pre-operative cardiovascular examination   . Renal cancer ([place]) 06/2001   Right  . Secondary cardiomyopathy, unspecified   . Sinus bradycardia   . Special screening for osteoporosis   . Urge incontinence     Patient Active Problem List   Diagnosis Date Noted  . Urinary frequency 09/18/2017  . Iliac artery stenosis, bilateral ([place]) 07/14/2017  . Recurrent UTI 07/14/2017  . Long term (current) use of anticoagulants 05/08/2017  . Microcytic anemia 11/18/2016  . Dysuria 11/18/2016  . Pulmonary hypertension ([place]) 12/04/2015  . B12 deficiency 05/22/2015  . PAD (peripheral artery disease) ([place])   . Elevated serum creatinine   . Renal cell carcinoma (  [place])   . S/p nephrectomy   . TIA (transient ischemic attack) 08/16/2014  . Weakness 01/14/2014  . Chronic kidney disease (CKD), stage IV (severe) ([place]) 12/08/2013  . Encounter for long-term (current) use of antiplatelets/antithrombotics 06/07/2013  . Chronic diastolic CHF (congestive heart failure) ([place]) 06/04/2013  . Mitral valve regurgitation 06/04/2013  . Allergic rhinitis 01/01/2013  . Chronic cholecystitis with calculus 09/15/2012  . Claudication ([place]) 01/21/2012  . Dizziness 11/26/2011  . Hypertension 05/20/2011  . Avascular  necrosis of hip ([place]) 05/03/2011  . OTHER AND UNSPECIFIED COAGULATION DEFECTS 10/13/2009  . RECTAL BLEEDING 10/13/2009  . HOCM / IHSS 07/14/2009  . EDEMA 07/14/2009  . Hyperlipidemia 04/10/2009  . ADJUSTMENT DISORDER WITH ANXIETY 04/10/2009  . INCONTINENCE, URGE 04/10/2009  . Atrial fibrillation ([place]) 10/27/2008  . SINUS BRADYCARDIA 10/27/2008  . Carotid artery stenosis 10/27/2008  . COPD (chronic obstructive pulmonary disease) ([place]) 10/27/2008  . Implantable cardioverter-defibrillator (ICD) in situ 10/27/2008    Past Surgical History:  Procedure Laterality Date  . ABDOMINAL HYSTERECTOMY  1975   for benign causes  . APPENDECTOMY    . BI-VENTRICULAR PACEMAKER UPGRADE  05/04/2010  . CARDIAC CATHETERIZATION  2006  . CARDIOVERSION N/A 02/20/2018   Procedure: CARDIOVERSION;  Surgeon: Minna Merritts, MD;  Location: [facility];  Service: Cardiovascular;  Laterality: N/A;  . CARDIOVERSION N/A 03/27/2018   Procedure: CARDIOVERSION;  Surgeon: Minna Merritts, MD;  Location: [facility];  Service: Cardiovascular;  Laterality: N/A;  . CATARACT EXTRACTION W/ INTRAOCULAR LENS  IMPLANT, BILATERAL  01/2006-02-2006  . CHOLECYSTECTOMY N/A 11/11/2012   Procedure: LAPAROSCOPIC CHOLECYSTECTOMY WITH INTRAOPERATIVE CHOLANGIOGRAM;  Surgeon: Imogene Burn. Georgette Dover, MD;  Location: [facility];  Service: General;  Laterality: N/A;  . EP IMPLANTABLE DEVICE N/A 02/21/2016   Procedure: ICD Generator Changeout;  Surgeon: Deboraha Sprang, MD;  Location: [facility];  Service: Cardiovascular;  Laterality: N/A;  . INSERT / REPLACE / REMOVE PACEMAKER  05-01-11   02-28-05-/05-04-10-ICD-MEDTRONIC MAXIMAL DR  . JOINT REPLACEMENT    . LAPAROSCOPIC CHOLECYSTECTOMY  11/11/2012  . LAPAROSCOPIC LYSIS OF ADHESIONS N/A 11/11/2012   Procedure: LAPAROSCOPIC LYSIS OF ADHESIONS;  Surgeon: Imogene Burn. Georgette Dover, MD;  Location: [facility];  Service: General;  Laterality: N/A;  . NEPHRECTOMY Right 06/2001    S/P RENAL CELL CANCER  . TOTAL HIP  ARTHROPLASTY Right 05/03/2011   Procedure: TOTAL HIP ARTHROPLASTY ANTERIOR APPROACH;  Surgeon: Mcarthur Rossetti;  Location: [facility];  Service: Orthopedics;  Laterality: Right;  Removal of Cannulated Screws Right Hip, Right Direct Anterior Hip Replacement     OB History   None      Home Medications    Prior to Admission medications   Medication Sig Start Date End Date Taking? Authorizing Provider  acetaminophen (TYLENOL) 500 MG tablet Take 1,000 mg by mouth daily as needed for headache.     [provider]  carvedilol (COREG) 6.25 MG tablet Take 1 tablet (6.25 mg total) by mouth 2 (two) times daily. 12/23/17   Minna Merritts, MD  cephALEXin (KEFLEX) 500 MG capsule Take 1 capsule (500 mg total) by mouth 2 (two) times daily. 04/01/18   Tower, Wynelle Fanny, MD  cetirizine (ZYRTEC) 10 MG tablet Take 10 mg by mouth daily.     [provider]  CRANBERRY PO Take 4 tablets by mouth daily.    [provider]  ezetimibe (ZETIA) 10 MG tablet Take 1 tablet (10 mg total) by mouth daily. 08/18/17   Minna Merritts, MD  hydrocortisone 2.5 % cream Apply 1 application topically daily as needed (itching).  02/04/18   [provider]  metolazone (ZAROXOLYN) 5 MG tablet Take 1 tablet (5 mg) by mouth 30 minutes prior to torsemide dose as directed Patient taking differently: Take 5 mg by mouth See admin instructions. Only take when directed by dr 02/17/18   Deboraha Sprang, MD  mupirocin ointment (BACTROBAN) 2 % Place 1 application into the nose daily.    [provider]  potassium chloride SA (K-DUR,KLOR-CON) 20 MEQ tablet Take 1 tablet (20 meq) by mouth once daily 02/24/18   Deboraha Sprang, MD  rosuvastatin (CRESTOR) 40 MG tablet Take 1 tablet (40 mg total) by mouth daily. 11/11/16   Minna Merritts, MD  torsemide (DEMADEX) 20 MG tablet Take 2 tablets (40 mg) by mouth once daily Patient taking differently: Take 20-40 mg by mouth See admin instructions. Take 20 mg  daily, may take a second 20 mg dose as needed for swelling 02/24/18   Deboraha Sprang, MD  warfarin (COUMADIN) 2 MG tablet Take 1 1/2 tablets daily or AS DIRECTED BY ANTICOAGULATION CLINIC. Patient taking differently: Take 2 mg by mouth every evening.  12/29/17   Elby Beck, FNP    Family History Family History  Problem Relation Age of Onset  . Heart failure Mother   . Breast cancer Maternal Aunt 82  . Breast cancer Cousin   . Breast cancer Other   . Colon cancer Neg Hx     Social History Social History   Tobacco Use  . Smoking status: Former Smoker    Packs/day: 0.50    Years: 40.00    Pack years: 20.00    Types: Cigarettes    Last attempt to quit: 05/06/2001    Years since quitting: 16.9  . Smokeless tobacco: Never Used  Substance Use Topics  . Alcohol use: No    Alcohol/week: 0.0 standard drinks  . Drug use: No     Allergies   Codeine; Morphine and related; and Sulfonamide derivatives   Review of Systems Review of Systems  Constitutional: Negative for fever.  HENT: Negative for ear pain and sore throat.   Eyes: Positive for visual disturbance (resolved).  Respiratory: Positive for shortness of breath.   Cardiovascular: Positive for leg swelling (chronic, unchanged). Negative for chest pain and palpitations.  Gastrointestinal: Negative for abdominal pain, constipation, diarrhea, nausea and vomiting.  Genitourinary: Negative for flank pain.  Musculoskeletal: Negative for back pain.  Skin: Negative for rash.  Neurological: Positive for light-headedness. Negative for dizziness, seizures, syncope, numbness and headaches.  All other systems reviewed and are negative.    Physical Exam Updated Vital Signs BP (!) 150/62 (BP Location: Right Arm)   Pulse 61   Temp 98.1 F (36.7 C) (Oral)   Resp 16   SpO2 98%   Physical Exam  Constitutional: She appears well-developed and well-nourished. No distress.  HENT:  Head: Normocephalic and atraumatic.    Mouth/Throat: Oropharynx is clear and moist.  Eyes: Pupils are equal, round, and reactive to light. Conjunctivae and EOM are normal.  Neck: Neck supple.  Cardiovascular: Normal rate, regular rhythm and normal heart sounds.  No murmur heard. Pulmonary/Chest: Effort normal and breath sounds normal. No stridor. No respiratory distress. She has no wheezes.  Abdominal: Soft. There is no tenderness.  Musculoskeletal:  2+ BLE pitting edema  Neurological: She is alert.  Mental Status:  Alert, thought content appropriate, able to give a coherent history. Speech  fluent without evidence of aphasia. Able to follow 2 step commands without difficulty.  Cranial Nerves:  II:  pupils equal, round, reactive to light III,IV, VI: ptosis not present, extra-ocular motions intact bilaterally  V,VII: smile symmetric, facial light touch sensation equal VIII: hearing grossly normal to voice  X: uvula elevates symmetrically  XI: bilateral shoulder shrug symmetric and strong XII: midline tongue extension without fassiculations Motor:  Normal tone. 5/5 strength of BUE and BLE major muscle groups including strong and equal grip strength and dorsiflexion/plantar flexion Sensory: light touch normal in all extremities.  Skin: Skin is warm and dry.  Psychiatric: She has a normal mood and affect.  Nursing note and vitals reviewed.    ED Treatments / Results  Labs (all labs ordered are listed, but only abnormal results are displayed) Labs Reviewed  BASIC METABOLIC PANEL - Abnormal; Notable for the following components:      Result Value   BUN 28 (*)    Creatinine, Ser 1.68 (*)    GFR calc non Af Amer 28 (*)    GFR calc Af Amer 32 (*)    All other components within normal limits  CBC - Abnormal; Notable for the following components:   Hemoglobin 8.7 (*)    HCT 30.8 (*)    MCV 75.3 (*)    MCH 21.3 (*)    MCHC 28.2 (*)    RDW 17.4 (*)    All other components within normal limits  I-STAT TROPONIN, ED     EKG EKG Interpretation  Date/Time:  Monday April 06 2018 19:31:18 EST Ventricular Rate:  62 PR Interval:    QRS Duration: 162 QT Interval:  526 QTC Calculation: 533 R Axis:   -98 Text Interpretation:  AV sequential or dual chamber electronic pacemaker No significant change since last tracing Confirmed by Deno Etienne [phone]) on 04/06/2018 9:02:38 PM   Radiology Dg Chest 2 View  Result Date: 04/06/2018 CLINICAL DATA:  Shortness of breath EXAM: CHEST - 2 VIEW COMPARISON:  04/30/2016 FINDINGS: Left AICD remains in place, unchanged. Cardiomegaly. Small bilateral pleural effusions. Bibasilar opacities could reflect atelectasis or infiltrates, left greater than right. No overt edema. No acute bony abnormality. IMPRESSION: Cardiomegaly. Small bilateral pleural effusions with bibasilar atelectasis or infiltrates, left greater than right. Electronically Signed   By: Rolm Baptise M.D.   On: 04/06/2018 21:38    Procedures Procedures (including critical care time)  Medications Ordered in ED Medications - No data to display   Initial Impression / Assessment and Plan / ED Course  I have reviewed the triage vital signs and the nursing notes.  Pertinent labs & imaging results that were available during my care of the patient were reviewed by me and considered in my medical decision making (see chart for details).     Final Clinical Impressions(s) / ED Diagnoses   Final diagnoses:  Lightheadedness   Patient presenting to the emergency department today for evaluation of lightheadedness, dyspnea on exertion that has been ongoing for some time but seem to have worsened today.  No chest pain.  Does have some lower extremity swelling that she states is at her baseline.  She did recently have some medication changes about a week ago.  Her neurologic exam is nonfocal, doubt neurologic cause.  Cardiac and pulmonary exams are benign.  Lab work is reassuring with no leukocytosis.  She does have some  anemia with a hemoglobin of 8.7, this does not appear to be far off from her baseline.  Electrolytes within normal limits.  Her BUN is elevated at 28 and her serum creatinine is also elevated but at her baseline.  Troponin is negative.  EKG is unchanged from prior.  Her chest x-ray shows bilateral pleural effusions with bilateral atelectasis, left greater than right.  Favor atelectasis rather than infiltrates.  Patient states that she has an appointment with her cardiologist tomorrow, I feel that given her work-up has been negative today that she is appropriate for outpatient follow-up with her cardiologist tomorrow.  Doubt emergent pathology that would require further work-up or admission to the hospital at this time.  Patient was seen in conjunction with Dr. Tyrone Nine who agrees with the current work-up and plan for outpatient follow-up with her cardiologist.  ED Discharge Orders    None       Bishop Dublin 04/06/18 Stow, [place], DO 04/06/18 2352

## 2018-04-06 NOTE — Discharge Instructions (Signed)
You will need to follow-up with your cardiologist tomorrow as already scheduled.  Please inform them of the symptoms that you have been having since her medications were changed.  Please return to the emergency department for any chest pain, worsening shortness of breath, worsening lower extremity swelling, weakness, numbness or any new or worsening symptoms.

## 2018-04-06 NOTE — ED Notes (Signed)
Patient verbalizes understanding of discharge instructions. Opportunity for questioning and answers were provided. Armband removed by staff, pt discharged from ED.  

## 2018-04-07 ENCOUNTER — Encounter: Payer: Self-pay | Admitting: Internal Medicine

## 2018-04-07 ENCOUNTER — Ambulatory Visit (INDEPENDENT_AMBULATORY_CARE_PROVIDER_SITE_OTHER): Payer: PPO | Admitting: Internal Medicine

## 2018-04-07 ENCOUNTER — Telehealth: Payer: Self-pay

## 2018-04-07 VITALS — BP 120/54 | HR 64 | Ht 66.5 in | Wt 191.0 lb

## 2018-04-07 DIAGNOSIS — I5032 Chronic diastolic (congestive) heart failure: Secondary | ICD-10-CM

## 2018-04-07 DIAGNOSIS — Z95810 Presence of automatic (implantable) cardiac defibrillator: Secondary | ICD-10-CM | POA: Diagnosis not present

## 2018-04-07 DIAGNOSIS — I4819 Other persistent atrial fibrillation: Secondary | ICD-10-CM

## 2018-04-07 DIAGNOSIS — I951 Orthostatic hypotension: Secondary | ICD-10-CM

## 2018-04-07 DIAGNOSIS — I495 Sick sinus syndrome: Secondary | ICD-10-CM

## 2018-04-07 DIAGNOSIS — I428 Other cardiomyopathies: Secondary | ICD-10-CM

## 2018-04-07 NOTE — Patient Instructions (Signed)
Medication Instructions:  - Your physician has recommended you make the following change in your medication:   1) STOP coreg (carvedilol)  If you need a refill on your cardiac medications before your next appointment, please call your pharmacy.   Lab work: - Your physician recommends that you have lab work today: cortisol  If you have labs (blood work) drawn today and your tests are completely normal, you will receive your results only by: Marland Kitchen MyChart Message (if you have MyChart) OR . A paper copy in the mail If you have any lab test that is abnormal or we need to change your treatment, we will call you to review the results.  Testing/Procedures: - none ordered  Follow-Up: At [facility], you and your health needs are our priority.  As part of our continuing mission to provide you with exceptional heart care, we have created designated Provider Care Teams.  These Care Teams include your primary Cardiologist (physician) and Advanced Practice Providers (APPs -  Physician Assistants and Nurse Practitioners) who all work together to provide you with the care you need, when you need it. . 6 weeks with Dr. Caryl Comes  Any Other Special Instructions Will Be Listed Below (If Applicable).  - please obtain an Abdominal Binder to be worn during your waking hours ([facility]/ [facility] usually have these).

## 2018-04-07 NOTE — Progress Notes (Signed)
Patient Care Team: [name] Beck, FNP as PCP - General (Nurse Practitioner) [name] Merritts, MD as Consulting Physician (Cardiology) [name] Sprang, MD as Consulting Physician (Cardiology) [name], [name] Lory, MD as Consulting Physician (Vascular Surgery)   HPI  [name] is a 84 y.o. female Seen in followup for nonischemic cardiomyopathycomplicated by acute/chronic heart failure and persistent atrial fibrillation  Underwent DCCV last week; periprocedural labs were notable for K 2.8 and Cr 2.08    Has a primary prevention ICD implanted initially 2006 with generator replacement about 5 years ago and replacement again 10/17      She has persistent  atrial fibrillation; previously treated with disopyramide changed to  flecainide. But 2/2 side effects  back on disopyramide. ( the d/c summary 4/16 was WRONG-describing the use of amiodarone)   when seen 9/19 with significant atrial fibrillation burden disopyramide was discontinued and amiodarone subsequently initiated;   has had nausea but has tolerated taking it 100 mg twice daily  She has had intercurrent heart failure.  Treated with DC cardioversion which held only for a few days.   Required up titration of diuretics with the introduction of intermittent metolazone.  Attention to her chronic renal insufficiency   With interval up titration of amiodarone, nausea recurred and was intolerable.  She has struggled recently with profound dizziness and near syncope.  This predates the amiodarone but was worsened by the amiodarone.   Cath 2006 without obstructive disease DATE TEST EF   1/15 Echo  65 % Mod Pulm HTN, TR and Mild MR   4/16 Echo   65 % Mod-sev PulmHTN with RVE, Mod MR severe TR  6/17 Echo  65% Mild-mod MR  12/18 Myoview 65% No ischemia         Anticoagulation with warfarin.         Date Cr K Hgb TSH LFTs  12/17 1.63 3.5 9.5    7/18 1.5 4.0 10.4    5/19 1.42 4.2 10.6    10/19 2.08 2.8 9.3 7.28     10/19 1.81 3.1 9.1    12/19 1.68 3.5 8.7              CHADS-VASc score of greater than or equal to 6 (age-55, gender-1, TIA-2 (4/16), heart failure-1)     She has a great grandson [name] to whom she is very attached.  He is a Furniture conservator/restorer and a cross-country running   Past Medical History:  Diagnosis Date  . Adjustment disorder with anxiety   . Anginal pain ([place])   . Arthritis    "some in my hands" (11/11/2012)  . Atrial fibrillation ([place])   . Automatic implantable cardioverter-defibrillator in situ   . Blood in stool   . Carotid artery stenosis 09/2007   60-79% bilateral (stable)  . Carotid artery stenosis 10/2008   40-59% R 60-79%   . Chest pain, unspecified   . Chronic airway obstruction, not elsewhere classified   . Chronic bronchitis ([place])    "get it some; not q year" (11/11/2012)  . Chronic diastolic CHF (congestive heart failure) ([place]) 06/04/2013  . Chronic kidney disease, unspecified   . Coronary artery disease    non-obstructive by 2006 cath  . Dysuria   . Edema   . Encounter for therapeutic drug monitoring   . Exertional shortness of breath   . Frequent UTI    "get them a couple times/yr" (11/11/2012)  . Hemorrhage of rectum and anus   .  High cholesterol   . History of blood transfusion 04/2011   "after hip OR" (11/11/2012)  . Hypertension 05/20/2011  . Hypertr obst cardiomyop   . Hypotension, unspecified    cardiac cath 2006..nonobstructive CAD 30-40s lesions.Marland KitchenETT 1/09 nondiagnostic due to poor HR response..Right Renal Cancer 2003  . Long term (current) use of anticoagulants   . Malignant neoplasm of kidney, except pelvis   . Osteoarthritis of right hip   . Other acute sinusitis   . Other and unspecified coagulation defects   . Other malaise and fatigue   . Other screening mammogram   . PONV (postoperative nausea and vomiting)   . Pre-operative cardiovascular examination   . Renal cancer ([place]) 06/2001   Right  . Secondary cardiomyopathy, unspecified   . Sinus  bradycardia   . Special screening for osteoporosis   . Urge incontinence     Past Surgical History:  Procedure Laterality Date  . ABDOMINAL HYSTERECTOMY  1975   for benign causes  . APPENDECTOMY    . BI-VENTRICULAR PACEMAKER UPGRADE  05/04/2010  . CARDIAC CATHETERIZATION  2006  . CARDIOVERSION N/A 02/20/2018   Procedure: CARDIOVERSION;  Surgeon: [name] Merritts, MD;  Location: [facility];  Service: Cardiovascular;  Laterality: N/A;  . CARDIOVERSION N/A 03/27/2018   Procedure: CARDIOVERSION;  Surgeon: [name] Merritts, MD;  Location: [facility];  Service: Cardiovascular;  Laterality: N/A;  . CATARACT EXTRACTION W/ INTRAOCULAR LENS  IMPLANT, BILATERAL  01/2006-02-2006  . CHOLECYSTECTOMY N/A 11/11/2012   Procedure: LAPAROSCOPIC CHOLECYSTECTOMY WITH INTRAOPERATIVE CHOLANGIOGRAM;  Surgeon: Imogene Burn. Georgette Dover, MD;  Location: [place];  Service: General;  Laterality: N/A;  . EP IMPLANTABLE DEVICE N/A 02/21/2016   Procedure: ICD Generator Changeout;  Surgeon: [name] Sprang, MD;  Location: [facility];  Service: Cardiovascular;  Laterality: N/A;  . INSERT / REPLACE / REMOVE PACEMAKER  05-01-11   02-28-05-/05-04-10-ICD-MEDTRONIC MAXIMAL DR  . JOINT REPLACEMENT    . LAPAROSCOPIC CHOLECYSTECTOMY  11/11/2012  . LAPAROSCOPIC LYSIS OF ADHESIONS N/A 11/11/2012   Procedure: LAPAROSCOPIC LYSIS OF ADHESIONS;  Surgeon: Imogene Burn. Georgette Dover, MD;  Location: [place];  Service: General;  Laterality: N/A;  . NEPHRECTOMY Right 06/2001    S/P RENAL CELL CANCER  . TOTAL HIP ARTHROPLASTY Right 05/03/2011   Procedure: TOTAL HIP ARTHROPLASTY ANTERIOR APPROACH;  Surgeon: Mcarthur Rossetti;  Location: [facility];  Service: Orthopedics;  Laterality: Right;  Removal of Cannulated Screws Right Hip, Right Direct Anterior Hip Replacement    Current Outpatient Medications  Medication Sig Dispense Refill  . acetaminophen (TYLENOL) 500 MG tablet Take 1,000 mg by mouth daily as needed for headache.     . carvedilol (COREG) 6.25 MG  tablet Take 1 tablet (6.25 mg total) by mouth 2 (two) times daily. 180 tablet 3  . cephALEXin (KEFLEX) 500 MG capsule Take 1 capsule (500 mg total) by mouth 2 (two) times daily. 14 capsule 0  . cetirizine (ZYRTEC) 10 MG tablet Take 10 mg by mouth daily.     Marland Kitchen CRANBERRY PO Take 4 tablets by mouth daily.    Marland Kitchen ezetimibe (ZETIA) 10 MG tablet Take 1 tablet (10 mg total) by mouth daily. 30 tablet 11  . hydrocortisone 2.5 % cream Apply 1 application topically daily as needed (itching).     . metolazone (ZAROXOLYN) 5 MG tablet Take 1 tablet (5 mg) by mouth 30 minutes prior to torsemide dose as directed (Patient taking differently: Take 5 mg by mouth See admin instructions. Only take when directed by dr)  10 tablet 0  . mupirocin ointment (BACTROBAN) 2 % Place 1 application into the nose daily.    . potassium chloride SA (K-DUR,KLOR-CON) 20 MEQ tablet Take 1 tablet (20 meq) by mouth once daily 30 tablet 0  . rosuvastatin (CRESTOR) 40 MG tablet Take 1 tablet (40 mg total) by mouth daily. 90 tablet 3  . torsemide (DEMADEX) 20 MG tablet Take 2 tablets (40 mg) by mouth once daily (Patient taking differently: Take 20-40 mg by mouth See admin instructions. Take 20 mg daily, may take a second 20 mg dose as needed for swelling)    . warfarin (COUMADIN) 2 MG tablet Take 1 1/2 tablets daily or AS DIRECTED BY ANTICOAGULATION CLINIC. (Patient taking differently: Take 2 mg by mouth every evening. ) 130 tablet 1   No current facility-administered medications for this visit.     Allergies  Allergen Reactions  . Codeine Nausea And Vomiting  . Morphine And Related Nausea And Vomiting  . Sulfonamide Derivatives Other (See Comments)    Dry mouth    Review of Systems negative except from HPI and PMH  Physical Exam BP (!) 120/54 (BP Location: Left Arm, Patient Position: Sitting, Cuff Size: Normal)   Pulse 64   Ht 5' 6.5" (1.689 m)   Wt 191 lb (86.6 kg)   BMI 30.37 kg/m  Well developed and nourished in no acute  distress HENT normal Neck supple with JVP-flat Clear Regular rate and rhythm, no murmurs or gallops Abd-soft with active BS No Clubbing cyanosis 2+ edema Skin-warm and dry A & Oriented  Grossly normal sensory and motor function     ECG AV pacing  Assessment and  Plan  Nonischemic cardiomyopathy with interval normalization of LV function  Sinus node dysfunction device dependent   Mitral regurgitation  Complete heart block intermittent  Congestive heart failure chronic/diastolic class IIb-IIIa  Atrial fibrillation/flutter persistent  Amiodarone therapy   Chronic Renal insufficiency grade 3   Dizziness   Implantable defibrillator  Nausea  Orthostatic dizziness   She has been holding sinus rhythm.    She is intolerant of amiodarone and atrial fibrillation will undoubtedly where it said.    Mildly volume overloaded.  We will undertake gentle diuresis   We will profound symptom is dizziness.  Thankfully [place]-CMA-check orthostatics and noted a 60 mm drop which recovered by 3 minutes.  We will stop her carvedilol.  Given the problem with volume overload, I worry about the impact of diuresis.  In the interim will use an abdominal binder and hopefully avoid the need of more medication i.e. Mestinon.    We spent more than 50% of our >25 min visit in face to face counseling regarding the above E

## 2018-04-07 NOTE — Telephone Encounter (Signed)
[facility] faxed note; per chart review tab pt was seen in [facility] ED on 04/06/18. Pt has appt with cardiology 04/07/18 @ 11:30. FYI to Glenda Chroman FNP. TH note in Glenda Chroman FNP in box and sent for scanning.

## 2018-04-08 LAB — CORTISOL: Cortisol: 11.8 ug/dL

## 2018-04-08 NOTE — Telephone Encounter (Signed)
Noted  

## 2018-04-09 ENCOUNTER — Ambulatory Visit (INDEPENDENT_AMBULATORY_CARE_PROVIDER_SITE_OTHER): Payer: PPO | Admitting: General Practice

## 2018-04-09 ENCOUNTER — Telehealth: Payer: Self-pay | Admitting: Internal Medicine

## 2018-04-09 DIAGNOSIS — Z7901 Long term (current) use of anticoagulants: Secondary | ICD-10-CM

## 2018-04-09 DIAGNOSIS — Z7902 Long term (current) use of antithrombotics/antiplatelets: Secondary | ICD-10-CM

## 2018-04-09 LAB — POCT INR: INR: 3.7 — AB (ref 2.0–3.0)

## 2018-04-09 NOTE — Telephone Encounter (Signed)
Pt c/o BP issue: STAT if pt c/o blurred vision, one-sided weakness or slurred speech  1. What are your last 5 BP readings?  Todays :       164/82  Sitting 143/73 standing              170/73 Sitting  151/67 Standing   2. Are you having any other symptoms (ex. Dizziness, headache, blurred vision, passed out)? Dizzy purple fingers pressure in head   3. What is your BP issue? Elevated now that carvedilol was stopped

## 2018-04-09 NOTE — Patient Instructions (Signed)
Pre visit review using our clinic review tool, if applicable. No additional management support is needed unless otherwise documented below in the visit note.  Hold coumadin today (12/5) and tomorrow (12/6) then decrease weekly dose to 1 pill (2mg ) daily EXCEPT for 1/2 pill (1mg ) on Sunday/Tues/ Thursdays.  Recheck in 2 weeks.  *Note just finished keflex.

## 2018-04-09 NOTE — Telephone Encounter (Signed)
I spoke with the patient. She advised that since stopping her coreg, her BP yesterday and today have been:  12/4- AM- 153/78 sitting 129/63 standing PM- 173/88 sitting 158/64 standing  12/5-  AM- 164/82 sitting 143/73 standing PM- 170/73 sitting 151/67 standing  She states she is not feeling as bad a pounding in her head as she was prior to stopping coreg. She states Dr. Caryl Comes had debated on having her decrease coreg to 3.125 mg BID, but decided to stop this instead. I have advised her although her BP is dropping with change in position, it is still elevated. She is aware she may try taking coreg 3.125 mg tonight and again in the AM if readings are still high and I will review with Dr. Caryl Comes further recommendations and call her back tomorrow.   She voices understanding and is agreeable.

## 2018-04-10 MED ORDER — CARVEDILOL 12.5 MG PO TABS: ORAL_TABLET | ORAL | 3 refills | Status: DC

## 2018-04-10 NOTE — Telephone Encounter (Signed)
Clarified with Dr. Caryl Comes, that the patient would take coreg 3.125 mg once daily in the AM.  I called the patient to follow up. She advised she had actually been taking coreg 12.5 mg BID when this was stopped in clinic this week (not 6.25 mg BID). She confirms she did take coreg 12.5 mg- 1/2 tablet (6.25 mg) last night and this morning and she has felt well.   I have advised the patient to continue to take coreg 12.5 mg- 1/2 tablet (6.25 mg) once daily in the AM. She is to call if she feels like she is not tolerating this so we can call in a 3.125 mg tablet for her and have her try that dose once daily in the AM.   The patient voices understanding and is agreeable.

## 2018-04-10 NOTE — Telephone Encounter (Signed)
What we have her try and take her carvedilol just in the morning.  Have a great weekend and great next week

## 2018-04-16 ENCOUNTER — Telehealth: Payer: Self-pay | Admitting: Cardiology

## 2018-04-16 ENCOUNTER — Ambulatory Visit (INDEPENDENT_AMBULATORY_CARE_PROVIDER_SITE_OTHER): Payer: PPO

## 2018-04-16 DIAGNOSIS — I4819 Other persistent atrial fibrillation: Secondary | ICD-10-CM

## 2018-04-16 DIAGNOSIS — Z95810 Presence of automatic (implantable) cardiac defibrillator: Secondary | ICD-10-CM

## 2018-04-16 DIAGNOSIS — I5032 Chronic diastolic (congestive) heart failure: Secondary | ICD-10-CM

## 2018-04-16 NOTE — Telephone Encounter (Signed)
Attempted to confirm remote transmission with pt. No answer and was unable to leave a message.   

## 2018-04-17 ENCOUNTER — Telehealth: Payer: Self-pay | Admitting: Family Medicine

## 2018-04-17 ENCOUNTER — Ambulatory Visit: Payer: PPO | Admitting: Family Medicine

## 2018-04-17 NOTE — Progress Notes (Signed)
EPIC Encounter for ICM Monitoring  Patient Name: [name] is a 84 y.o. female Date: 04/17/2018 Primary Care Physican: Elby Beck, [place] Primary Cardiologist: Rockey Situ Electrophysiologist: Caryl Comes Last Weight: 191 lbs (183-185 lbs) Today's Weight: 189 lbs   Clinical Status (07-Apr-2018 to 17-Apr-2018)  AT/AF 3 episodes  Time in AT/AF 18.5 hr/day (77.2%)   Longest AT/AF    7 days Observations (2) (07-Apr-2018 to 17-Apr-2018)  AT/AF >= 6 hr for 8 days.  Patient Activity less than 1 hr/day for 1 weeks.       Heart Failure questions reviewed, pt symptomatic with shortness of breath and swelling of feet and legs but no worse than she was at last visit with Dr Caryl Comes.  She has been taking Furosemide 20 mg 2 tablets for 3 to 4 days at a time alternating with 1 tablet every few days but no relief of symptoms.     Thoracic impedance abnormal suggesting fluid accumulation starting 03/26/2018. Appears to be out of NSR since ~12/5.        Prescribed: Furosemide 20 mg Take 1 tablet (20 mg) by mouth once daily, take an extra tablet (20 mg) once daily as needed for weight > 187 lbs.  Metolazone 5 mg- take 1 tablet by mouth once daily as needed for weight >/ = 192 lbs (call the office if you feel like you need this more than once a week) per Dr Olin Pia office note 03/17/2018  Labs: 04/06/2018 Creatinine 1.68, BUN 28, Potassium 3.5, Sodium 139, eGFR 28-32 03/17/2018 Creatinine 1.59, BUN 24, Potassium 4.4, Sodium 143, eGFR 30-35  02/24/2018 Creatinine 1.81, BUN 34, Potassium 3.1, Sodium 139, eGFR 25-29  02/20/2018 Creatinine 2.08, BUN 36, Potassium 2.8, Sodium 138, eGFR 21-25  02/17/2018 Creatinine 1.57, BUN 23, Potassium 3.4, Sodium 138, eGFR 30-35  A complete set of results can be found in Results Review.  Recommendations:  No changes.   Encouraged to call for fluid symptoms.  Follow-up plan: ICM clinic phone appointment on 04/21/2018 to recheck fluid levels.  Office appointment  scheduled 05/19/2018 with Dr. Caryl Comes.    Copy of ICM check sent to Dr. Caryl Comes and Dr Rockey Situ.   3 month ICM trend: 04/17/2018    AT/AF     1 Year ICM trend:       Rosalene Billings, RN 04/17/2018 7:48 AM

## 2018-04-17 NOTE — Telephone Encounter (Signed)
Pt called office today to reschedule the ER fu with Debbie to 04/22/18. Pt stated she spoke with her cardiologist and that she has some swelling and does not feel up to par to come in today.

## 2018-04-17 NOTE — Progress Notes (Signed)
I spoke with Dr. Caryl Comes regarding the patient's ICM transmission received today.  Per Dr. Caryl Comes: 1) The patient needs to take metolazone 5 mg x 1 dose now, wait 30 minutes and take an additional torsemide 40 mg x 1 dose. 2) schedule an echocardiogram  Per Dr. Caryl Comes, next steps for treatment of her A-fib will depend on results of the echo.  I have called and notified the patient. She voices understanding of all of the above.  She is aware that I will order her echo and scheduling will call her to arrange an appointment.

## 2018-04-21 ENCOUNTER — Telehealth: Payer: Self-pay

## 2018-04-21 ENCOUNTER — Ambulatory Visit (INDEPENDENT_AMBULATORY_CARE_PROVIDER_SITE_OTHER): Payer: PPO

## 2018-04-21 DIAGNOSIS — I5032 Chronic diastolic (congestive) heart failure: Secondary | ICD-10-CM

## 2018-04-21 DIAGNOSIS — Z95810 Presence of automatic (implantable) cardiac defibrillator: Secondary | ICD-10-CM

## 2018-04-21 NOTE — Progress Notes (Signed)
EPIC Encounter for ICM Monitoring  Patient Name: [name] is a 84 y.o. female Date: 04/21/2018 Primary Care Physican: Elby Beck, [place] Primary Cardiologist: Rockey Situ Electrophysiologist: Caryl Comes Last FVOHKG:677 [phone] lbs) Today's Weight: unknown  Clinical Status (17-Apr-2018 to 21-Apr-2018)  Treated VT/VF 0 episodes   AT/AF 1 episode   Time in AT/AF 24.0 hr/day (100.0%)  Longest AT/AF 11 days  Observations (1) (17-Apr-2018 to 21-Apr-2018)  AT/AF >= 6 hr for 4 days.                                                            Attempted call to patient and unable to reach.  Transmission reviewed.     Thoracic impedance returned close to normal after taking metolazone 5 mg x 1 dose and additional torsemide 40 mg x 1 dose on 05/17/2017 as ordered by Dr Caryl Comes.         Prescribed: Furosemide 20 mg Take 1 tablet (20 mg) by mouth once daily, take an extra tablet (20 mg) once daily as needed for weight > 187 lbs.  Metolazone 5 mg- take 1 tablet by mouth once daily as needed for weight >/ = 192 lbs (call the office if you feel like you need this more than once a week) per Dr Olin Pia office note 03/17/2018  Labs: 04/06/2018 Creatinine 1.68, BUN 28, Potassium 3.5, Sodium 139, eGFR 28-32 03/17/2018 Creatinine 1.59, BUN 24, Potassium 4.4, Sodium 143, eGFR 30-35  02/24/2018 Creatinine 1.81, BUN 34, Potassium 3.1, Sodium 139, eGFR 25-29  02/20/2018 Creatinine 2.08, BUN 36, Potassium 2.8, Sodium 138, eGFR 21-25  02/17/2018 Creatinine 1.57, BUN 23, Potassium 3.4, Sodium 138, eGFR 30-35  A complete set of results can be found in Results Review.  Recommendations:  No changes.   Encouraged to call for fluid symptoms.  Follow-up plan: ICM clinic phone appointment on 05/04/2018 to recheck fluid levels.  Office appointment scheduled 05/19/2018 with Dr. Caryl Comes.    Copy of ICM check sent to Dr. Caryl Comes.  AT/AF   3 month ICM trend: 04/21/2018    1 Year ICM trend:        Rosalene Billings, RN 04/21/2018 6:15 PM

## 2018-04-21 NOTE — Telephone Encounter (Signed)
Remote ICM transmission received.  Attempted call to patient regarding ICM remote transmission and no answer.  

## 2018-04-22 ENCOUNTER — Encounter: Payer: Self-pay | Admitting: Family Medicine

## 2018-04-22 ENCOUNTER — Ambulatory Visit (INDEPENDENT_AMBULATORY_CARE_PROVIDER_SITE_OTHER): Payer: PPO

## 2018-04-22 ENCOUNTER — Ambulatory Visit (INDEPENDENT_AMBULATORY_CARE_PROVIDER_SITE_OTHER): Payer: PPO | Admitting: Family Medicine

## 2018-04-22 VITALS — BP 160/68 | HR 91 | Temp 97.7°F | Ht 66.5 in | Wt 179.0 lb

## 2018-04-22 DIAGNOSIS — Z7901 Long term (current) use of anticoagulants: Secondary | ICD-10-CM

## 2018-04-22 DIAGNOSIS — R310 Gross hematuria: Secondary | ICD-10-CM | POA: Diagnosis not present

## 2018-04-22 DIAGNOSIS — H6992 Unspecified Eustachian tube disorder, left ear: Secondary | ICD-10-CM | POA: Diagnosis not present

## 2018-04-22 DIAGNOSIS — R300 Dysuria: Secondary | ICD-10-CM | POA: Diagnosis not present

## 2018-04-22 DIAGNOSIS — R0981 Nasal congestion: Secondary | ICD-10-CM

## 2018-04-22 DIAGNOSIS — E538 Deficiency of other specified B group vitamins: Secondary | ICD-10-CM | POA: Diagnosis not present

## 2018-04-22 DIAGNOSIS — Z7902 Long term (current) use of antithrombotics/antiplatelets: Secondary | ICD-10-CM

## 2018-04-22 LAB — POC URINALSYSI DIPSTICK (AUTOMATED)
Bilirubin, UA: NEGATIVE
Blood, UA: POSITIVE
Glucose, UA: NEGATIVE
Ketones, UA: NEGATIVE
Leukocytes, UA: NEGATIVE
Nitrite, UA: NEGATIVE
Protein, UA: NEGATIVE
Spec Grav, UA: 1.015 (ref 1.010–1.025)
Urobilinogen, UA: 0.2 E.U./dL
pH, UA: 7 (ref 5.0–8.0)

## 2018-04-22 LAB — POCT INR: INR: 1.7 — AB (ref 2.0–3.0)

## 2018-04-22 MED ORDER — CYANOCOBALAMIN 1000 MCG/ML IJ SOLN
1000.0000 ug | Freq: Once | INTRAMUSCULAR | Status: AC
  Administered 2018-04-22: 1000 ug via INTRAMUSCULAR

## 2018-04-22 MED ORDER — FLUTICASONE PROPIONATE 50 MCG/ACT NA SUSP: 2.0000 | Freq: Every day | NASAL | 6 refills | Status: DC

## 2018-04-22 NOTE — Patient Instructions (Signed)
INR today 1.7  Take 1.5 pills (3mg ) today and then continue on  1 pill (2mg ) daily EXCEPT for 1/2 pill (1mg ) on Sunday/Tues/ Thursdays.  Recheck in 2 weeks.     Patient given 1053mcg/ml Vitamin B12 per monthly order.  Administered to Left Deltoid.  Patient tolerated well.

## 2018-04-22 NOTE — Patient Instructions (Signed)
Good to see you today  Your urine has a little blood but no infection. Keep drinking enough liquids to make your urine light yellow.   Follow up in 3 months

## 2018-04-22 NOTE — Progress Notes (Signed)
Subjective:    Patient ID: [name], female    DOB: Dec 06, 1933, 84 y.o.   MRN: [id number]  HPI This is an 84 yo female who presents today for follow up of lightheadedness. Has had problems with afib off and on. Being followed closely by cardiology. Has had cardioversion x 2. Weighing daily, clear on instructions for notifying cardiology for diuresis. Currently feels that she is not fluid overloaded. No current lightheadedness, at baseline for activity.   Has had frequent UTI, recently finished cephalexin with some improvement in symptoms but continues to have some blood. Taking cranberry tablets qid.   Most recent INR elevated at 3.7. Was recently on cephalexin for UTI. Due to have checked tomorrow, will check today to save her a trip.   Left ear feels like it "won't pop," and she has had some recent rhinorrhea. Otherwise feels well. No cough, headache or fever.   Good family support, not driving anymore, her granddaughter takes her to her appointments.    Past Medical History:  Diagnosis Date  . Adjustment disorder with anxiety   . Anginal pain ([place])   . Arthritis    "some in my hands" (11/11/2012)  . Atrial fibrillation ([place])   . Automatic implantable cardioverter-defibrillator in situ   . Blood in stool   . Carotid artery stenosis 09/2007   60-79% bilateral (stable)  . Carotid artery stenosis 10/2008   40-59% R 60-79%   . Chest pain, unspecified   . Chronic airway obstruction, not elsewhere classified   . Chronic bronchitis ([place])    "get it some; not q year" (11/11/2012)  . Chronic diastolic CHF (congestive heart failure) ([place]) 06/04/2013  . Chronic kidney disease, unspecified   . Coronary artery disease    non-obstructive by 2006 cath  . Dysuria   . Edema   . Encounter for therapeutic drug monitoring   . Exertional shortness of breath   . Frequent UTI    "get them a couple times/yr" (11/11/2012)  . Hemorrhage of rectum and anus   . High cholesterol   . History of  blood transfusion 04/2011   "after hip OR" (11/11/2012)  . Hypertension 05/20/2011  . Hypertr obst cardiomyop   . Hypotension, unspecified    cardiac cath 2006..nonobstructive CAD 30-40s lesions.Marland KitchenETT 1/09 nondiagnostic due to poor HR response..Right Renal Cancer 2003  . Long term (current) use of anticoagulants   . Malignant neoplasm of kidney, except pelvis   . Osteoarthritis of right hip   . Other acute sinusitis   . Other and unspecified coagulation defects   . Other malaise and fatigue   . Other screening mammogram   . PONV (postoperative nausea and vomiting)   . Pre-operative cardiovascular examination   . Renal cancer ([place]) 06/2001   Right  . Secondary cardiomyopathy, unspecified   . Sinus bradycardia   . Special screening for osteoporosis   . Urge incontinence    Past Surgical History:  Procedure Laterality Date  . ABDOMINAL HYSTERECTOMY  1975   for benign causes  . APPENDECTOMY    . BI-VENTRICULAR PACEMAKER UPGRADE  05/04/2010  . CARDIAC CATHETERIZATION  2006  . CARDIOVERSION N/A 02/20/2018   Procedure: CARDIOVERSION;  Surgeon: Minna Merritts, MD;  Location: [facility];  Service: Cardiovascular;  Laterality: N/A;  . CARDIOVERSION N/A 03/27/2018   Procedure: CARDIOVERSION;  Surgeon: Minna Merritts, MD;  Location: [facility];  Service: Cardiovascular;  Laterality: N/A;  . CATARACT EXTRACTION W/ INTRAOCULAR LENS  IMPLANT, BILATERAL  01/2006-02-2006  .  CHOLECYSTECTOMY N/A 11/11/2012   Procedure: LAPAROSCOPIC CHOLECYSTECTOMY WITH INTRAOPERATIVE CHOLANGIOGRAM;  Surgeon: Imogene Burn. Georgette Dover, MD;  Location: [facility];  Service: General;  Laterality: N/A;  . EP IMPLANTABLE DEVICE N/A 02/21/2016   Procedure: ICD Generator Changeout;  Surgeon: Deboraha Sprang, MD;  Location: [facility];  Service: Cardiovascular;  Laterality: N/A;  . INSERT / REPLACE / REMOVE PACEMAKER  05-01-11   02-28-05-/05-04-10-ICD-MEDTRONIC MAXIMAL DR  . JOINT REPLACEMENT    . LAPAROSCOPIC CHOLECYSTECTOMY   11/11/2012  . LAPAROSCOPIC LYSIS OF ADHESIONS N/A 11/11/2012   Procedure: LAPAROSCOPIC LYSIS OF ADHESIONS;  Surgeon: Imogene Burn. Georgette Dover, MD;  Location: [facility];  Service: General;  Laterality: N/A;  . NEPHRECTOMY Right 06/2001    S/P RENAL CELL CANCER  . TOTAL HIP ARTHROPLASTY Right 05/03/2011   Procedure: TOTAL HIP ARTHROPLASTY ANTERIOR APPROACH;  Surgeon: Mcarthur Rossetti;  Location: [facility];  Service: Orthopedics;  Laterality: Right;  Removal of Cannulated Screws Right Hip, Right Direct Anterior Hip Replacement   Family History  Problem Relation Age of Onset  . Heart failure Mother   . Breast cancer Maternal Aunt 82  . Breast cancer Cousin   . Breast cancer Other   . Colon cancer Neg Hx    Social History   Tobacco Use  . Smoking status: Former Smoker    Packs/day: 0.50    Years: 40.00    Pack years: 20.00    Types: Cigarettes    Last attempt to quit: 05/06/2001    Years since quitting: 16.9  . Smokeless tobacco: Never Used  Substance Use Topics  . Alcohol use: No    Alcohol/week: 0.0 standard drinks  . Drug use: No      Review of Systems Per HPI    Objective:   Physical Exam Vitals signs reviewed.  Constitutional:      Appearance: Normal appearance. She is normal weight.  HENT:     Head: Normocephalic and atraumatic.     Right Ear: Tympanic membrane, ear canal and external ear normal.     Left Ear: Ear canal and external ear normal.     Ears:     Comments: Left TM dull.     Nose: Congestion and rhinorrhea (clear) present.     Mouth/Throat:     Mouth: Mucous membranes are moist.     Pharynx: Oropharynx is clear. No oropharyngeal exudate or posterior oropharyngeal erythema.  Eyes:     Conjunctiva/sclera: Conjunctivae normal.  Cardiovascular:     Rate and Rhythm: Normal rate and regular rhythm.     Heart sounds: Normal heart sounds.  Pulmonary:     Effort: Pulmonary effort is normal.     Breath sounds: Normal breath sounds.  Musculoskeletal:        General: No  swelling.  Neurological:     Mental Status: She is alert and oriented to person, place, and time.       BP (!) 160/68 (BP Location: Right Arm, Patient Position: Sitting, Cuff Size: Normal)   Pulse 91   Temp 97.7 F (36.5 C) (Oral)   Ht 5' 6.5" (1.689 m)   Wt 179 lb (81.2 kg)   SpO2 95%   BMI 28.46 kg/m  Wt Readings from Last 3 Encounters:  04/22/18 179 lb (81.2 kg)  04/07/18 191 lb (86.6 kg)  04/01/18 186 lb 8 oz (84.6 kg)   Results for orders placed or performed in visit on 04/22/18  POCT Urinalysis Dipstick (Automated)  Result Value Ref Range  Color, UA yellow    Clarity, UA clear    Glucose, UA Negative Negative   Bilirubin, UA negative    Ketones, UA negative    Spec Grav, UA 1.015 1.010 - 1.025   Blood, UA positive    pH, UA 7.0 5.0 - 8.0   Protein, UA Negative Negative   Urobilinogen, UA 0.2 0.2 or 1.0 E.U./dL   Nitrite, UA negative    Leukocytes, UA Negative Negative       Assessment & Plan:  1. Dysuria - no leukocytes/nitrites - continue cranberry tablets and good fluid intake - POCT Urinalysis Dipstick (Automated)  2. Gross hematuria - likely related to anticoagulation - POCT Urinalysis Dipstick (Automated)  3. Nasal congestion - continue Zyrtec - fluticasone (FLONASE) 50 MCG/ACT nasal spray; Place 2 sprays into both nostrils daily.  Dispense: 16 g; Refill: 6  4. Eustachian tube disorder, left - fluticasone (FLONASE) 50 MCG/ACT nasal spray; Place 2 sprays into both nostrils daily.  Dispense: 16 g; Refill: 6  5. Long term (current) use of anticoagulants - last INR elevated, will check today  - follow up in 3 months   Clarene Reamer, FNP-BC  [facility], [place]  04/22/2018 12:58 PM

## 2018-04-23 ENCOUNTER — Ambulatory Visit: Payer: PPO

## 2018-04-25 NOTE — Progress Notes (Signed)
I have reviewed this visit and I agree on the patient's plan of dosage and recommendations. Nassim Cosma B Dalani Mette, FNP   

## 2018-05-05 NOTE — Progress Notes (Signed)
No ICM remote transmission received for 05/04/2018 and next ICM transmission scheduled for 06/22/2018.  Patient has in office defib check with Dr Caryl Comes 05/19/2018

## 2018-05-07 ENCOUNTER — Other Ambulatory Visit: Payer: Self-pay | Admitting: Cardiovascular Disease

## 2018-05-07 ENCOUNTER — Ambulatory Visit (INDEPENDENT_AMBULATORY_CARE_PROVIDER_SITE_OTHER): Payer: PPO | Admitting: General Practice

## 2018-05-07 DIAGNOSIS — Z7902 Long term (current) use of antithrombotics/antiplatelets: Secondary | ICD-10-CM

## 2018-05-07 DIAGNOSIS — Z7901 Long term (current) use of anticoagulants: Secondary | ICD-10-CM

## 2018-05-07 DIAGNOSIS — I739 Peripheral vascular disease, unspecified: Secondary | ICD-10-CM

## 2018-05-07 LAB — POCT INR: INR: 2.4 (ref 2.0–3.0)

## 2018-05-07 NOTE — Patient Instructions (Signed)
Pre visit review using our clinic review tool, if applicable. No additional management support is needed unless otherwise documented below in the visit note.  Continue on  1 pill (2mg ) daily EXCEPT for 1/2 pill (1mg ) on Sunday/Tues/ Thursdays.  Recheck in 4 weeks.

## 2018-05-12 ENCOUNTER — Other Ambulatory Visit: Payer: Self-pay

## 2018-05-12 ENCOUNTER — Encounter: Payer: Self-pay | Admitting: Emergency Medicine

## 2018-05-12 ENCOUNTER — Telehealth: Payer: Self-pay | Admitting: Internal Medicine

## 2018-05-12 ENCOUNTER — Emergency Department: Payer: PPO

## 2018-05-12 ENCOUNTER — Emergency Department
Admission: EM | Admit: 2018-05-12 | Discharge: 2018-05-12 | Disposition: A | Discharge status: Home / Self Care | Payer: PPO | Attending: Emergency Medicine | Admitting: Emergency Medicine

## 2018-05-12 ENCOUNTER — Telehealth: Payer: Self-pay

## 2018-05-12 ENCOUNTER — Telehealth: Payer: Self-pay | Admitting: Student

## 2018-05-12 DIAGNOSIS — Z87891 Personal history of nicotine dependence: Secondary | ICD-10-CM | POA: Diagnosis not present

## 2018-05-12 DIAGNOSIS — Z79899 Other long term (current) drug therapy: Secondary | ICD-10-CM | POA: Insufficient documentation

## 2018-05-12 DIAGNOSIS — N184 Chronic kidney disease, stage 4 (severe): Secondary | ICD-10-CM | POA: Diagnosis not present

## 2018-05-12 DIAGNOSIS — J449 Chronic obstructive pulmonary disease, unspecified: Secondary | ICD-10-CM | POA: Insufficient documentation

## 2018-05-12 DIAGNOSIS — Z85528 Personal history of other malignant neoplasm of kidney: Secondary | ICD-10-CM | POA: Insufficient documentation

## 2018-05-12 DIAGNOSIS — Z96641 Presence of right artificial hip joint: Secondary | ICD-10-CM | POA: Insufficient documentation

## 2018-05-12 DIAGNOSIS — I110 Hypertensive heart disease with heart failure: Secondary | ICD-10-CM | POA: Diagnosis not present

## 2018-05-12 DIAGNOSIS — Z7901 Long term (current) use of anticoagulants: Secondary | ICD-10-CM | POA: Insufficient documentation

## 2018-05-12 DIAGNOSIS — R0602 Shortness of breath: Secondary | ICD-10-CM | POA: Diagnosis not present

## 2018-05-12 DIAGNOSIS — I5032 Chronic diastolic (congestive) heart failure: Secondary | ICD-10-CM | POA: Insufficient documentation

## 2018-05-12 DIAGNOSIS — I509 Heart failure, unspecified: Secondary | ICD-10-CM | POA: Diagnosis not present

## 2018-05-12 DIAGNOSIS — I130 Hypertensive heart and chronic kidney disease with heart failure and stage 1 through stage 4 chronic kidney disease, or unspecified chronic kidney disease: Secondary | ICD-10-CM | POA: Insufficient documentation

## 2018-05-12 LAB — BASIC METABOLIC PANEL
Anion gap: 7 (ref 5–15)
BUN: 20 mg/dL (ref 8–23)
CO2: 30 mmol/L (ref 22–32)
Calcium: 8.9 mg/dL (ref 8.9–10.3)
Chloride: 101 mmol/L (ref 98–111)
Creatinine, Ser: 1.5 mg/dL — ABNORMAL HIGH (ref 0.44–1.00)
GFR calc Af Amer: 37 mL/min — ABNORMAL LOW (ref >60)
GFR calc non Af Amer: 32 mL/min — ABNORMAL LOW (ref >60)
Glucose, Bld: 98 mg/dL (ref 70–99)
Potassium: 4.3 mmol/L (ref 3.5–5.1)
Sodium: 138 mmol/L (ref 135–145)

## 2018-05-12 LAB — CBC
HCT: 32.2 % — ABNORMAL LOW (ref 36.0–46.0)
Hemoglobin: 8.9 g/dL — ABNORMAL LOW (ref 12.0–15.0)
MCH: 19.6 pg — ABNORMAL LOW (ref 26.0–34.0)
MCHC: 27.6 g/dL — ABNORMAL LOW (ref 30.0–36.0)
MCV: 71.1 fL — ABNORMAL LOW (ref 80.0–100.0)
Platelets: 255 10*3/uL (ref 150–400)
RBC: 4.53 MIL/uL (ref 3.87–5.11)
RDW: 18.8 % — ABNORMAL HIGH (ref 11.5–15.5)
WBC: 3.9 10*3/uL — ABNORMAL LOW (ref 4.0–10.5)
nRBC: 0 % (ref 0.0–0.2)

## 2018-05-12 LAB — TROPONIN I
Troponin I: 0.03 ng/mL (ref <0.03)
Troponin I: 0.03 ng/mL (ref <0.03)

## 2018-05-12 MED ORDER — FUROSEMIDE 10 MG/ML IJ SOLN
80.0000 mg | Freq: Once | INTRAMUSCULAR | Status: AC
  Administered 2018-05-12: 80 mg via INTRAVENOUS
  Filled 2018-05-12: qty 8

## 2018-05-12 NOTE — ED Triage Notes (Signed)
Says short of breath since about 1.5 weeks ago.  Has appt with dr Caryl Comes next week, so she was trying to wait for that.  Appears short of breath.

## 2018-05-12 NOTE — Telephone Encounter (Signed)
   Received page from answering service from granddaughter [name] that states "patient is in the ER for hours and no one is doing anything." Per chart review, patient called our office earlier day around 12pm reporting shortness of breath, severe leg swelling, and weight gain of 2-3 pounds. Patient was having difficulty talking on the phone due to shortness of breath. Patient was advised to go to the ED immediately. Patient presented to the [facility] ED around 4:15pm. I returned granddaughter's call around 8pm and left a message on the answering machine. It looks like patient had already been seen by ED provider at that time.   Darreld Mclean, PA-C 05/12/2018 9:33 PM

## 2018-05-12 NOTE — Telephone Encounter (Signed)
I was contacted by [facility] ED; pt presented with increasing shortness of breath and fatigue.  She was noted to be mildly volume overloaded and in rate-controlled a-fib.  She as given furosemide 80 mg IV x 1 and able to ambulate without difficulty.  EDP anticipates discharge home this evening.  I have recommended increase torsemide from 40 mg daily to 40 mg BID and following up in our office later this week.  I will have our schedulers reach out to the patient.  Nelva Bush, MD [facility] Pager: [phone]

## 2018-05-12 NOTE — Telephone Encounter (Signed)
Spoke with patient.  Advised Dr Caryl Comes instructed her to go to [facility] ER in [place].  Explained the fluid overload and afib both need to be addressed and Dr Caryl Comes thinks she needs to be in the hospital.  She agreed to go to [facility] ER but it may be in a couple of hours before she can get there.  Advised if she is unable to get a ride to call 911 for ambulance.  Advised to proceed to ER asap and explained she will more than likely stay at least one night maybe longer. She verbalized understanding. Explained Dr Olin Pia nurse Alvis Lemmings will contact [facility] to advise of admission.

## 2018-05-12 NOTE — ED Provider Notes (Signed)
[facility] Emergency Department Provider Note ____________________________________________   First MD Initiated Contact with Patient 05/12/18 1804     (approximate)  I have reviewed the triage vital signs and the nursing notes.   HISTORY  Chief Complaint Shortness of Breath    HPI [name] is a 85 y.o. female with PMH as noted below including atrial fibrillation and CHF who presents with worsening shortness of breath over the last week, gradual onset, worse with exertion, and associated with extremity swelling.  The patient states that she continues to be in atrial fibrillation.  She states that her home diuretics are not working well enough.  She denies any chest pain, cough, or fever.  Past Medical History:  Diagnosis Date  . Adjustment disorder with anxiety   . Anginal pain ([place])   . Arthritis    "some in my hands" (11/11/2012)  . Atrial fibrillation ([place])   . Automatic implantable cardioverter-defibrillator in situ   . Blood in stool   . Carotid artery stenosis 09/2007   60-79% bilateral (stable)  . Carotid artery stenosis 10/2008   40-59% R 60-79%   . Chest pain, unspecified   . Chronic airway obstruction, not elsewhere classified   . Chronic bronchitis ([place])    "get it some; not q year" (11/11/2012)  . Chronic diastolic CHF (congestive heart failure) ([place]) 06/04/2013  . Chronic kidney disease, unspecified   . Coronary artery disease    non-obstructive by 2006 cath  . Dysuria   . Edema   . Encounter for therapeutic drug monitoring   . Exertional shortness of breath   . Frequent UTI    "get them a couple times/yr" (11/11/2012)  . Hemorrhage of rectum and anus   . High cholesterol   . History of blood transfusion 04/2011   "after hip OR" (11/11/2012)  . Hypertension 05/20/2011  . Hypertr obst cardiomyop   . Hypotension, unspecified    cardiac cath 2006..nonobstructive CAD 30-40s lesions.Marland KitchenETT 1/09 nondiagnostic due to poor HR  response..Right Renal Cancer 2003  . Long term (current) use of anticoagulants   . Malignant neoplasm of kidney, except pelvis   . Osteoarthritis of right hip   . Other acute sinusitis   . Other and unspecified coagulation defects   . Other malaise and fatigue   . Other screening mammogram   . PONV (postoperative nausea and vomiting)   . Pre-operative cardiovascular examination   . Renal cancer ([place]) 06/2001   Right  . Secondary cardiomyopathy, unspecified   . Sinus bradycardia   . Special screening for osteoporosis   . Urge incontinence     Patient Active Problem List   Diagnosis Date Noted  . Urinary frequency 09/18/2017  . Iliac artery stenosis, bilateral ([place]) 07/14/2017  . Recurrent UTI 07/14/2017  . Long term (current) use of anticoagulants 05/08/2017  . Microcytic anemia 11/18/2016  . Dysuria 11/18/2016  . Pulmonary hypertension ([place]) 12/04/2015  . B12 deficiency 05/22/2015  . PAD (peripheral artery disease) ([place])   . Elevated serum creatinine   . Renal cell carcinoma ([place])   . S/p nephrectomy   . TIA (transient ischemic attack) 08/16/2014  . Weakness 01/14/2014  . Chronic kidney disease (CKD), stage IV (severe) ([place]) 12/08/2013  . Encounter for long-term (current) use of antiplatelets/antithrombotics 06/07/2013  . Chronic diastolic CHF (congestive heart failure) ([place]) 06/04/2013  . Mitral valve regurgitation 06/04/2013  . Allergic rhinitis 01/01/2013  . Chronic cholecystitis with calculus 09/15/2012  . Claudication ([place]) 01/21/2012  .  Dizziness 11/26/2011  . Hypertension 05/20/2011  . Avascular necrosis of hip ([place]) 05/03/2011  . OTHER AND UNSPECIFIED COAGULATION DEFECTS 10/13/2009  . RECTAL BLEEDING 10/13/2009  . HOCM / IHSS 07/14/2009  . EDEMA 07/14/2009  . Hyperlipidemia 04/10/2009  . ADJUSTMENT DISORDER WITH ANXIETY 04/10/2009  . INCONTINENCE, URGE 04/10/2009  . Atrial fibrillation ([place]) 10/27/2008  . SINUS BRADYCARDIA 10/27/2008  . Carotid artery  stenosis 10/27/2008  . COPD (chronic obstructive pulmonary disease) ([place]) 10/27/2008  . Implantable cardioverter-defibrillator (ICD) in situ 10/27/2008    Past Surgical History:  Procedure Laterality Date  . ABDOMINAL HYSTERECTOMY  1975   for benign causes  . APPENDECTOMY    . BI-VENTRICULAR PACEMAKER UPGRADE  05/04/2010  . CARDIAC CATHETERIZATION  2006  . CARDIOVERSION N/A 02/20/2018   Procedure: CARDIOVERSION;  Surgeon: Minna Merritts, MD;  Location: [facility];  Service: Cardiovascular;  Laterality: N/A;  . CARDIOVERSION N/A 03/27/2018   Procedure: CARDIOVERSION;  Surgeon: Minna Merritts, MD;  Location: [facility];  Service: Cardiovascular;  Laterality: N/A;  . CATARACT EXTRACTION W/ INTRAOCULAR LENS  IMPLANT, BILATERAL  01/2006-02-2006  . CHOLECYSTECTOMY N/A 11/11/2012   Procedure: LAPAROSCOPIC CHOLECYSTECTOMY WITH INTRAOPERATIVE CHOLANGIOGRAM;  Surgeon: Imogene Burn. Georgette Dover, MD;  Location: [facility];  Service: General;  Laterality: N/A;  . EP IMPLANTABLE DEVICE N/A 02/21/2016   Procedure: ICD Generator Changeout;  Surgeon: Deboraha Sprang, MD;  Location: [facility];  Service: Cardiovascular;  Laterality: N/A;  . INSERT / REPLACE / REMOVE PACEMAKER  05-01-11   02-28-05-/05-04-10-ICD-MEDTRONIC MAXIMAL DR  . JOINT REPLACEMENT    . LAPAROSCOPIC CHOLECYSTECTOMY  11/11/2012  . LAPAROSCOPIC LYSIS OF ADHESIONS N/A 11/11/2012   Procedure: LAPAROSCOPIC LYSIS OF ADHESIONS;  Surgeon: Imogene Burn. Georgette Dover, MD;  Location: [facility];  Service: General;  Laterality: N/A;  . NEPHRECTOMY Right 06/2001    S/P RENAL CELL CANCER  . TOTAL HIP ARTHROPLASTY Right 05/03/2011   Procedure: TOTAL HIP ARTHROPLASTY ANTERIOR APPROACH;  Surgeon: Mcarthur Rossetti;  Location: [facility];  Service: Orthopedics;  Laterality: Right;  Removal of Cannulated Screws Right Hip, Right Direct Anterior Hip Replacement    Prior to Admission medications   Medication Sig Start Date End Date Taking? Authorizing Provider  acetaminophen  (TYLENOL) 500 MG tablet Take 1,000 mg by mouth daily as needed for headache.    Yes [provider]  carvedilol (COREG) 12.5 MG tablet Take 1/2 tablet (6.25 mg) once daily in the morning Patient taking differently: Take 6.25 mg by mouth daily.  04/10/18  Yes Deboraha Sprang, MD  cetirizine (ZYRTEC) 10 MG tablet Take 10 mg by mouth daily.    Yes [provider]  CRANBERRY PO Take 4 tablets by mouth daily.   Yes [provider]  ezetimibe (ZETIA) 10 MG tablet Take 1 tablet (10 mg total) by mouth daily. 08/18/17  Yes Gollan, Kathlene November, MD  fluticasone (FLONASE) 50 MCG/ACT nasal spray Place 2 sprays into both nostrils daily. 04/22/18  Yes Elby Beck, FNP  hydrocortisone 2.5 % cream Apply 1 application topically daily as needed (itching).  02/04/18  Yes [provider]  metolazone (ZAROXOLYN) 5 MG tablet Take 1 tablet (5 mg) by mouth 30 minutes prior to torsemide dose as directed Patient taking differently: Take 5 mg by mouth See admin instructions. Take 1 tablet (5mg ) by mouth only when directed by physician 02/17/18  Yes Deboraha Sprang, MD  mupirocin ointment (BACTROBAN) 2 % Place 1 application into the nose daily as needed.  Yes [provider]  rosuvastatin (CRESTOR) 40 MG tablet Take 1 tablet (40 mg total) by mouth daily. 11/11/16  Yes Minna Merritts, MD  torsemide (DEMADEX) 20 MG tablet Take 2 tablets (40 mg) by mouth once daily Patient taking differently: Take 20-40 mg by mouth See admin instructions. Take 1 tablet (20mg ) daily -- may repeat if needed for swelling 02/24/18  Yes Deboraha Sprang, MD  warfarin (COUMADIN) 2 MG tablet Take 1 1/2 tablets daily or AS DIRECTED BY ANTICOAGULATION CLINIC. Patient taking differently: Take 1-2 mg by mouth See admin instructions. Take  tablet (1mg ) by mouth every Sunday, Tuesday and Thursday evening and take 1 tablet (2mg ) by mouth every Monday, Wednesday, Friday and Saturday evening 12/29/17  Yes Elby Beck, FNP  potassium chloride SA (K-DUR,KLOR-CON) 20 MEQ tablet Take 1 tablet (20 meq) by mouth once daily 02/24/18   Deboraha Sprang, MD    Allergies Codeine; Morphine and related; and Sulfonamide derivatives  Family History  Problem Relation Age of Onset  . Heart failure Mother   . Breast cancer Maternal Aunt 82  . Breast cancer Cousin   . Breast cancer Other   . Colon cancer Neg Hx     Social History Social History   Tobacco Use  . Smoking status: Former Smoker    Packs/day: 0.50    Years: 40.00    Pack years: 20.00    Types: Cigarettes    Last attempt to quit: 05/06/2001    Years since quitting: 17.0  . Smokeless tobacco: Never Used  Substance Use Topics  . Alcohol use: No    Alcohol/week: 0.0 standard drinks  . Drug use: No    Review of Systems  Constitutional: No fever. Eyes: No redness. ENT: No sore throat. Cardiovascular: Denies chest pain. Respiratory: Positive for shortness of breath. Gastrointestinal: No vomiting or diarrhea.  Genitourinary: Negative for dysuria.  Musculoskeletal: Negative for back pain. Skin: Negative for rash. Neurological: Negative for headache.   ____________________________________________   PHYSICAL EXAM:  VITAL SIGNS: ED Triage Vitals  Enc Vitals Group     BP 05/12/18 1621 (!) 162/53     Pulse Rate 05/12/18 1621 60     Resp 05/12/18 1621 18     Temp 05/12/18 1624 (!) 96.3 F (35.7 C)     Temp Source 05/12/18 1624 Oral     SpO2 05/12/18 1621 100 %     Weight 05/12/18 1617 178 lb 12.7 oz (81.1 kg)     Height 05/12/18 1617 5\' 6"  (9.629 m)     Head Circumference --      Peak Flow --      Pain Score 05/12/18 1617 0     Pain Loc --      Pain Edu? --      Excl. in [place]? --     Constitutional: Alert and oriented.  Relatively well appearing and in no acute distress. Eyes: Conjunctivae are normal.  Head: Atraumatic. Nose: No congestion/rhinnorhea. Mouth/Throat: Mucous membranes are moist.   Neck: Normal range of  motion.  Cardiovascular: Normal rate, irregular rhythm. Grossly normal heart sounds.  Good peripheral circulation. Respiratory: Normal respiratory effort.  No retractions.  Decreased breath sounds bilaterally with no significant rales. Gastrointestinal: No distention.  Musculoskeletal: 1+ bilateral lower extremity edema.  Extremities warm and well perfused.  Neurologic:  Normal speech and language. No gross focal neurologic deficits are appreciated.  Skin:  Skin is warm and dry. No rash noted. Psychiatric: Mood and  affect are normal. Speech and behavior are normal.  ____________________________________________   LABS (all labs ordered are listed, but only abnormal results are displayed)  Labs Reviewed  BASIC METABOLIC PANEL - Abnormal; Notable for the following components:      Result Value   Creatinine, Ser 1.50 (*)    GFR calc non Af Amer 32 (*)    GFR calc Af Amer 37 (*)    All other components within normal limits  CBC - Abnormal; Notable for the following components:   WBC 3.9 (*)    Hemoglobin 8.9 (*)    HCT 32.2 (*)    MCV 71.1 (*)    MCH 19.6 (*)    MCHC 27.6 (*)    RDW 18.8 (*)    All other components within normal limits  TROPONIN I - Abnormal; Notable for the following components:   Troponin I 0.03 (*)    All other components within normal limits  TROPONIN I - Abnormal; Notable for the following components:   Troponin I 0.03 (*)    All other components within normal limits   ____________________________________________  EKG  ED ECG REPORT I, Arta Silence, the attending physician, personally viewed and interpreted this ECG.  Date: 05/12/2018 EKG Time: 1614 Rate: 67 Rhythm: Ventricular paced rhythm QRS Axis: normal Intervals: normal ST/T Wave abnormalities: normal Narrative Interpretation: no evidence of acute ischemia  ____________________________________________  RADIOLOGY  CXR: Bilateral pleural effusions, cardiomegaly, possible mild vascular  congestion, unchanged from prior  ____________________________________________   PROCEDURES  Procedure(s) performed: No  Procedures  Critical Care performed: No ____________________________________________   INITIAL IMPRESSION / ASSESSMENT AND PLAN / ED COURSE  Pertinent labs & imaging results that were available during my care of the patient were reviewed by me and considered in my medical decision making (see chart for details).  85 year old female with PMH as noted above including a history of CHF and atrial fibrillation presents with worsening shortness of breath and decreased exercise tolerance over approximately the last week.  I reviewed the past medical records in Epic; the patient last had a cardioversion in November.  On exam she is actually relatively well-appearing and her vital signs are normal except for hypertension.  She is in atrial fibrillation but it is not rapid.  Her O2 saturation is in the high 90s on room air.  She has decreased breath sounds bilaterally but no other acute findings.  Overall presentation is likely related to ongoing CHF and atrial fibrillation.  I reviewed her last note from her cardiologist Dr. Caryl Comes from last month discussing changes in her medication regimen.  Work-up so far reveals x-ray consistent with CHF and lab work-up stable from the patient's baseline.  Initial troponin is minimally elevated.  We will give a dose of IV Lasix, obtain a repeat troponin, and I will discuss the patient's case with [facility] cardiology on-call.  ----------------------------------------- 9:11 PM on 05/12/2018 -----------------------------------------  Patient continues to appear very comfortable and is ambulating to and from the bathroom without any difficulty.  Her O2 saturation remains in the high 90s on room air.  Repeat troponin after 3 hours was unchanged.  This minimal elevation is consistent with CHF.  There is no evidence of ACS.  I consulted Dr. Saunders Revel  from cardiology.  He reviewed the patient's case with me over the phone.  He advised that the patient could double her torsemide dose and that he would contact the office scheduler to arrange for closer follow-up.  The patient feels comfortable  and wants to go home.  In the chart her torsemide is listed as 40 mg daily but the patient states she is only taking 20.  I instructed her to double it as per Dr. Darnelle Bos recommendations.  I discussed the results of the work-up with the patient.  Return precautions given, and she expressed understanding.  ____________________________________________   FINAL CLINICAL IMPRESSION(S) / ED DIAGNOSES  Final diagnoses:  Chronic congestive heart failure, unspecified heart failure type ([place])      NEW MEDICATIONS STARTED DURING THIS VISIT:  New Prescriptions   No medications on file     Note:  This document was prepared using Dragon voice recognition software and may include unintentional dictation errors.    Arta Silence, MD 05/12/18 2113

## 2018-05-12 NOTE — ED Notes (Signed)
Patient ambulated with pulse ox, oxygen saturations 95%. Patient reports shortness of breath

## 2018-05-12 NOTE — Discharge Instructions (Addendum)
You should double your torsemide dose for the next several days until you follow-up with your cardiologist.  If you are taking 1 pill daily, you should take 2.  [facility] cardiology office will call you tomorrow to arrange for close follow-up.  If you do not hear from them by the afternoon you should call them to confirm follow-up.  Return to the ER for new or worsening shortness of breath, weakness, chest pain, or any other new or worsening symptoms that concern you.

## 2018-05-12 NOTE — Telephone Encounter (Signed)
Returned patient call as requested by voice mail message.  Patient reporting shortness of breath, severe leg swelling (unable to wear regular shoes) and weight gain of 2-3 pounds.  Patient having difficulty talking due to shortness of breath which is audible over the phone. Sounds like she is gasping for air. Symptoms ongoing x 2 weeks.  She took a Metolazone the week of Christmas without any improvement in symptoms and urination did not increase with one time dosage. She has some open sores on legs due to removal of cancerous places.  Assisted sending device remote transmission which suggest fluid accumulation since 04/21/2018.  Appears to be in Afib since 04/07/2018.  Advised would discuss with Dr Caryl Comes in the office and call back. Encouraged her to call 911 if breathing gets worse.   Optivol: 05/12/2018    AT/AF

## 2018-05-13 NOTE — Telephone Encounter (Signed)
Spoke with patient. Offered her appointment with Thurmond Butts tomorrow and Dr Rockey Situ on Friday. States she is scheduled to see Dr Caryl Comes next Tuesday on 05/19/2018 (this is correct). Patient would like to see only Dr Caryl Comes to discuss that fluid. Since yesterday, she is down 4 pounds. Says she still has some swelling in her legs/feet and "in her voice." This morning, weight was 184lb and is usually around 179lb. Patient refused to schedule appointment for this week and would like to know Dr Olin Pia advice prior to scheduling and just keep appointment next week. Says she is going to call Dr Olin Pia office and speak with Nira Conn. Let her know I was nurse from Dr Olin Pia office and Nira Conn unavailable today and I was glad to help in any way.  Encouraged patient that we want to make sure she is responding appropriately to therapy prior to next week.  She would like me to ask Dr Olin Pia advice.  Advised patient I will route to Dr Caryl Comes and Nira Conn for further advice.

## 2018-05-14 NOTE — Telephone Encounter (Signed)
Dr. Caryl Comes- he spoke with the patient. Her weight is down about 5-6 lbs and her breathing is better. He did advise her if symptoms worsen between now and 1/14, she needs to go to [facility] ER as she will need to be admitted to short out her atrial fibrillation.

## 2018-05-16 LAB — CUP PACEART REMOTE DEVICE CHECK
Battery Remaining Longevity: 62 mo
Battery Voltage: 2.97 V
Brady Statistic AP VP Percent: 16.88 %
Brady Statistic AP VS Percent: 0 %
Brady Statistic AS VP Percent: 82.75 %
Brady Statistic AS VS Percent: 0.37 %
Brady Statistic RA Percent Paced: 14.15 %
Brady Statistic RV Percent Paced: 99.64 %
Date Time Interrogation Session: 20191111143425
HighPow Impedance: 37 Ohm
HighPow Impedance: 43 Ohm
Implantable Lead Implant Date: 20061026
Implantable Lead Implant Date: 20111230
Implantable Lead Location: 753859
Implantable Lead Location: 753860
Implantable Lead Model: 5076
Implantable Lead Model: 6947
Implantable Pulse Generator Implant Date: 20171018
Lead Channel Impedance Value: 361 Ohm
Lead Channel Impedance Value: 418 Ohm
Lead Channel Impedance Value: 418 Ohm
Lead Channel Pacing Threshold Amplitude: 0.625 V
Lead Channel Pacing Threshold Pulse Width: 0.4 ms
Lead Channel Sensing Intrinsic Amplitude: 0.5 mV
Lead Channel Sensing Intrinsic Amplitude: 0.5 mV
Lead Channel Sensing Intrinsic Amplitude: 10.5 mV
Lead Channel Sensing Intrinsic Amplitude: 10.5 mV
Lead Channel Setting Pacing Amplitude: 2.5 V
Lead Channel Setting Pacing Amplitude: 2.5 V
Lead Channel Setting Pacing Pulse Width: 0.4 ms
Lead Channel Setting Sensing Sensitivity: 0.3 mV

## 2018-05-18 ENCOUNTER — Other Ambulatory Visit: Payer: PPO

## 2018-05-19 ENCOUNTER — Ambulatory Visit (INDEPENDENT_AMBULATORY_CARE_PROVIDER_SITE_OTHER): Payer: PPO | Admitting: Internal Medicine

## 2018-05-19 ENCOUNTER — Encounter: Payer: Self-pay | Admitting: Internal Medicine

## 2018-05-19 VITALS — BP 128/70 | HR 71 | Ht 66.0 in | Wt 187.0 lb

## 2018-05-19 DIAGNOSIS — I428 Other cardiomyopathies: Secondary | ICD-10-CM

## 2018-05-19 DIAGNOSIS — I5032 Chronic diastolic (congestive) heart failure: Secondary | ICD-10-CM

## 2018-05-19 DIAGNOSIS — I4819 Other persistent atrial fibrillation: Secondary | ICD-10-CM

## 2018-05-19 DIAGNOSIS — Z95810 Presence of automatic (implantable) cardiac defibrillator: Secondary | ICD-10-CM

## 2018-05-19 MED ORDER — SPIRONOLACTONE 25 MG PO TABS: 25.0000 mg | ORAL_TABLET | Freq: Every day | ORAL | 3 refills | Status: DC

## 2018-05-19 NOTE — Progress Notes (Signed)
Patient Care Team: Elby Beck, FNP as PCP - General (Nurse Practitioner) Minna Merritts, MD as Consulting Physician (Cardiology) Deboraha Sprang, MD as Consulting Physician (Cardiology) Delana Meyer, Dolores Lory, MD as Consulting Physician (Vascular Surgery)   HPI  [name] is a 85 y.o. female Seen in followup for nonischemic cardiomyopathycomplicated by acute/chronic heart failure and persistent atrial fibrillation  Underwent DCCV last week; periprocedural labs were notable for K 2.8 and Cr 2.08    Has a primary prevention ICD implanted initially 2006 with generator replacement about 5 years ago and replacement again 10/17      She has persistent  atrial fibrillation; previously treated with disopyramide changed to  flecainide. But 2/2 side effects  back on disopyramide. ( the d/c summary 4/16 was WRONG-describing the use of amiodarone)   when seen 9/19 with significant atrial fibrillation burden disopyramide was discontinued and amiodarone subsequently initiated;   has had nausea but has tolerated taking it 100 mg twice daily  She has had intercurrent heart failure.  Treated with DC cardioversion which held only for a few days.   Required up titration of diuretics with the introduction of intermittent metolazone.  Attention to her chronic renal insufficiency   With interval up titration of amiodarone, nausea recurred and was intolerable.  She has struggled recently with profound dizziness and near syncope.  This predates the amiodarone but was worsened by the amiodarone.   Cath 2006 without obstructive disease DATE TEST EF   1/15 Echo  65 % Mod Pulm HTN, TR and Mild MR   4/16 Echo   65 % Mod-sev PulmHTN with RVE, Mod MR severe TR  6/17 Echo  65% Mild-mod MR  12/18 Myoview 65% No ischemia         Anticoagulation with warfarin.         Date Cr K Hgb TSH LFTs  12/17 1.63 3.5 9.5    7/18 1.5 4.0 10.4    5/19 1.42 4.2 10.6    10/19 2.08 2.8 9.3 7.28     10/19 1.81 3.1 9.1    12/19 1.68 3.5 8.7              CHADS-VASc score of greater than or equal to 6 (age-89, gender-1, TIA-2 (4/16), heart failure-1)     She has a great grandson [name] to whom she is very attached.  He is a Furniture conservator/restorer and a cross-country running   Past Medical History:  Diagnosis Date  . Adjustment disorder with anxiety   . Anginal pain ([place])   . Arthritis    "some in my hands" (11/11/2012)  . Atrial fibrillation ([place])   . Automatic implantable cardioverter-defibrillator in situ   . Blood in stool   . Carotid artery stenosis 09/2007   60-79% bilateral (stable)  . Carotid artery stenosis 10/2008   40-59% R 60-79%   . Chest pain, unspecified   . Chronic airway obstruction, not elsewhere classified   . Chronic bronchitis ([place])    "get it some; not q year" (11/11/2012)  . Chronic diastolic CHF (congestive heart failure) ([place]) 06/04/2013  . Chronic kidney disease, unspecified   . Coronary artery disease    non-obstructive by 2006 cath  . Dysuria   . Edema   . Encounter for therapeutic drug monitoring   . Exertional shortness of breath   . Frequent UTI    "get them a couple times/yr" (11/11/2012)  . Hemorrhage of rectum and anus   .  High cholesterol   . History of blood transfusion 04/2011   "after hip OR" (11/11/2012)  . Hypertension 05/20/2011  . Hypertr obst cardiomyop   . Hypotension, unspecified    cardiac cath 2006..nonobstructive CAD 30-40s lesions.Marland KitchenETT 1/09 nondiagnostic due to poor HR response..Right Renal Cancer 2003  . Long term (current) use of anticoagulants   . Malignant neoplasm of kidney, except pelvis   . Osteoarthritis of right hip   . Other acute sinusitis   . Other and unspecified coagulation defects   . Other malaise and fatigue   . Other screening mammogram   . PONV (postoperative nausea and vomiting)   . Pre-operative cardiovascular examination   . Renal cancer ([place]) 06/2001   Right  . Secondary cardiomyopathy, unspecified   . Sinus  bradycardia   . Special screening for osteoporosis   . Urge incontinence     Past Surgical History:  Procedure Laterality Date  . ABDOMINAL HYSTERECTOMY  1975   for benign causes  . APPENDECTOMY    . BI-VENTRICULAR PACEMAKER UPGRADE  05/04/2010  . CARDIAC CATHETERIZATION  2006  . CARDIOVERSION N/A 02/20/2018   Procedure: CARDIOVERSION;  Surgeon: Minna Merritts, MD;  Location: [facility];  Service: Cardiovascular;  Laterality: N/A;  . CARDIOVERSION N/A 03/27/2018   Procedure: CARDIOVERSION;  Surgeon: Minna Merritts, MD;  Location: [facility];  Service: Cardiovascular;  Laterality: N/A;  . CATARACT EXTRACTION W/ INTRAOCULAR LENS  IMPLANT, BILATERAL  01/2006-02-2006  . CHOLECYSTECTOMY N/A 11/11/2012   Procedure: LAPAROSCOPIC CHOLECYSTECTOMY WITH INTRAOPERATIVE CHOLANGIOGRAM;  Surgeon: Imogene Burn. Georgette Dover, MD;  Location: [facility];  Service: General;  Laterality: N/A;  . EP IMPLANTABLE DEVICE N/A 02/21/2016   Procedure: ICD Generator Changeout;  Surgeon: Deboraha Sprang, MD;  Location: [place] [facility];  Service: Cardiovascular;  Laterality: N/A;  . INSERT / REPLACE / REMOVE PACEMAKER  05-01-11   02-28-05-/05-04-10-ICD-MEDTRONIC MAXIMAL DR  . JOINT REPLACEMENT    . LAPAROSCOPIC CHOLECYSTECTOMY  11/11/2012  . LAPAROSCOPIC LYSIS OF ADHESIONS N/A 11/11/2012   Procedure: LAPAROSCOPIC LYSIS OF ADHESIONS;  Surgeon: Imogene Burn. Georgette Dover, MD;  Location: [place] [facility];  Service: General;  Laterality: N/A;  . NEPHRECTOMY Right 06/2001    S/P RENAL CELL CANCER  . TOTAL HIP ARTHROPLASTY Right 05/03/2011   Procedure: TOTAL HIP ARTHROPLASTY ANTERIOR APPROACH;  Surgeon: Mcarthur Rossetti;  Location: [facility];  Service: Orthopedics;  Laterality: Right;  Removal of Cannulated Screws Right Hip, Right Direct Anterior Hip Replacement    Current Outpatient Medications  Medication Sig Dispense Refill  . acetaminophen (TYLENOL) 500 MG tablet Take 1,000 mg by mouth daily as needed for headache.     . carvedilol (COREG) 12.5 MG  tablet Take 1/2 tablet (6.25 mg) once daily in the morning (Patient taking differently: Take 6.25 mg by mouth daily. ) 180 tablet 3  . cetirizine (ZYRTEC) 10 MG tablet Take 10 mg by mouth daily.     Marland Kitchen CRANBERRY PO Take 4 tablets by mouth daily.    Marland Kitchen ezetimibe (ZETIA) 10 MG tablet Take 1 tablet (10 mg total) by mouth daily. 30 tablet 11  . fluticasone (FLONASE) 50 MCG/ACT nasal spray Place 2 sprays into both nostrils daily. 16 g 6  . hydrocortisone 2.5 % cream Apply 1 application topically daily as needed (itching).     . metolazone (ZAROXOLYN) 5 MG tablet Take 1 tablet (5 mg) by mouth 30 minutes prior to torsemide dose as directed (Patient taking differently: Take 5 mg by mouth See admin instructions. Take 1  tablet (5mg ) by mouth only when directed by physician) 10 tablet 0  . mupirocin ointment (BACTROBAN) 2 % Place 1 application into the nose daily as needed.     . potassium chloride SA (K-DUR,KLOR-CON) 20 MEQ tablet Take 1 tablet (20 meq) by mouth once daily 30 tablet 0  . rosuvastatin (CRESTOR) 40 MG tablet Take 1 tablet (40 mg total) by mouth daily. 90 tablet 3  . torsemide (DEMADEX) 20 MG tablet Take 2 tablets (40 mg) by mouth once daily (Patient taking differently: Take 20-40 mg by mouth See admin instructions. Take 1 tablet (20mg ) daily -- may repeat if needed for swelling)    . warfarin (COUMADIN) 2 MG tablet Take 1 1/2 tablets daily or AS DIRECTED BY ANTICOAGULATION CLINIC. (Patient taking differently: Take 1-2 mg by mouth See admin instructions. Take  tablet (1mg ) by mouth every Sunday, Tuesday and Thursday evening and take 1 tablet (2mg ) by mouth every Monday, Wednesday, Friday and Saturday evening) 130 tablet 1   No current facility-administered medications for this visit.     Allergies  Allergen Reactions  . Codeine Nausea And Vomiting  . Morphine And Related Nausea And Vomiting  . Sulfonamide Derivatives Other (See Comments)    Dry mouth    Review of Systems negative except  from HPI and PMH  Physical Exam BP 128/70 (BP Location: Left Arm, Patient Position: Sitting, Cuff Size: Normal)   Pulse 71   Ht 5\' 6"  (1.676 m)   Wt 187 lb (84.8 kg)   SpO2 93%   BMI 30.18 kg/m  Well developed and nourished in no acute distress HENT normal Neck supple with JVP-8-10 Clear Regular rate and rhythm, no murmurs or gallops Abd-soft with active BS No Clubbing cyanosis 2-3+ edema Skin-warm and dry A & Oriented  Grossly normal sensory and motor function    ECG AV pacing  Assessment and  Plan  Nonischemic cardiomyopathy with interval normalization of LV function  Sinus node dysfunction device dependent   Mitral regurgitation  Complete heart block intermittent  Congestive heart failure chronic/diastolic class IIb-IIIa  Atrial fibrillation/flutter persistent  Amiodarone therapy   Chronic Renal insufficiency grade 3   Dizziness   Implantable defibrillator  Orthostatic dizziness      She remains volume overloaded.  We have discussed the physiology of heart failure including the importance of salt restriction and fluid restriction and have reviewed sources of dietary salt and water.  Fluid intake is excessive; have encouraged her to get a decreased p.o. intake  At clearly her heart failure is aggravated by atrial fibrillation.  She was intolerant of amiodarone.  We have discussed sotalol and dofetilide as alternative; she is agreeable to going to [facility] for initiation of dofetilide.  In the interim, with her history of hypokalemia, we will need to replete this so as to avoid extra days at the hospital.  We have discussed the potential benefits as well as risks of dofetilide.  Because of her recurring issues with low potassium, we will begin her on spironolactone 25 mg daily.  This will hopefully decrease the burden of taking potassium or skills which is for her quite odorous.

## 2018-05-19 NOTE — Patient Instructions (Signed)
Medication Instructions:  - Your physician has recommended you make the following change in your medication:   1) START aldactone (spironolactone) 25 mg- take 1 tablet by mouth once daily  If you need a refill on your cardiac medications before your next appointment, please call your pharmacy.   Lab work: - Your physician recommends that you have lab work today: BMP  If you have labs (blood work) drawn today and your tests are completely normal, you will receive your results only by: Marland Kitchen MyChart Message (if you have MyChart) OR . A paper copy in the mail If you have any lab test that is abnormal or we need to change your treatment, we will call you to review the results.  Testing/Procedures: - none ordered  Follow-Up: At [facility], you and your health needs are our priority.  As part of our continuing mission to provide you with exceptional heart care, we have created designated Provider Care Teams.  These Care Teams include your primary Cardiologist (physician) and Advanced Practice Providers (APPs -  Physician Assistants and Nurse Practitioners) who all work together to provide you with the care you need, when you need it. . pending Tikosyn admission  Any Other Special Instructions Will Be Listed Below (If Applicable).  - I will send a message to our [facility]- they will be in touch with you to schedule an appointment/ admission date for Tikosyn.

## 2018-05-20 ENCOUNTER — Telehealth: Payer: Self-pay | Admitting: Pharmacist

## 2018-05-20 ENCOUNTER — Telehealth: Payer: Self-pay | Admitting: *Deleted

## 2018-05-20 LAB — BASIC METABOLIC PANEL
BUN/Creatinine Ratio: 10 — ABNORMAL LOW (ref 12–28)
BUN: 15 mg/dL (ref 8–27)
CO2: 29 mmol/L (ref 20–29)
Calcium: 9.2 mg/dL (ref 8.7–10.3)
Chloride: 99 mmol/L (ref 96–106)
Creatinine, Ser: 1.43 mg/dL — ABNORMAL HIGH (ref 0.57–1.00)
GFR calc Af Amer: 39 mL/min/{1.73_m2} — ABNORMAL LOW (ref >59)
GFR calc non Af Amer: 34 mL/min/{1.73_m2} — ABNORMAL LOW (ref >59)
Glucose: 100 mg/dL — ABNORMAL HIGH (ref 65–99)
Potassium: 4.4 mmol/L (ref 3.5–5.2)
Sodium: 143 mmol/L (ref 134–144)

## 2018-05-20 NOTE — Telephone Encounter (Signed)
Medication list reviewed in anticipation of upcoming Tikosyn initiation. Patient is taking metolazone which is contraindicated with Tikosyn. This will need to be stopped at least 3 days prior to hospitalization.  Patient is anticoagulated on warfarin. Pt will require 4 weekly therapeutic INR checks prior to Tikosyn start. We do not follow patient's INRs - weekly checks will need to be coordinated with managing MD. Will route message to [facility] who manages patient's INR.  Patient will need to be counseled to avoid use of Benadryl while on Tikosyn and in the 2-3 days prior to Tikosyn initiation.

## 2018-05-20 NOTE — Telephone Encounter (Signed)
Noted  

## 2018-05-20 NOTE — Telephone Encounter (Signed)
error 

## 2018-05-21 ENCOUNTER — Ambulatory Visit (INDEPENDENT_AMBULATORY_CARE_PROVIDER_SITE_OTHER): Payer: PPO | Admitting: General Practice

## 2018-05-21 DIAGNOSIS — Z7902 Long term (current) use of antithrombotics/antiplatelets: Secondary | ICD-10-CM

## 2018-05-21 DIAGNOSIS — Z7901 Long term (current) use of anticoagulants: Secondary | ICD-10-CM

## 2018-05-21 LAB — CUP PACEART INCLINIC DEVICE CHECK
Battery Remaining Longevity: 57 mo
Battery Voltage: 2.97 V
Brady Statistic AP VP Percent: 5.98 %
Brady Statistic AP VS Percent: 0.01 %
Brady Statistic AS VP Percent: 89.62 %
Brady Statistic AS VS Percent: 4.39 %
Brady Statistic RA Percent Paced: 4.46 %
Brady Statistic RV Percent Paced: 95.69 %
Date Time Interrogation Session: 20200114185952
HighPow Impedance: 31 Ohm
HighPow Impedance: 38 Ohm
Implantable Lead Implant Date: 20061026
Implantable Lead Implant Date: 20111230
Implantable Lead Location: 753859
Implantable Lead Location: 753860
Implantable Lead Model: 5076
Implantable Lead Model: 6947
Implantable Pulse Generator Implant Date: 20171018
Lead Channel Impedance Value: 342 Ohm
Lead Channel Impedance Value: 399 Ohm
Lead Channel Impedance Value: 456 Ohm
Lead Channel Pacing Threshold Amplitude: 0.75 V
Lead Channel Pacing Threshold Pulse Width: 0.4 ms
Lead Channel Sensing Intrinsic Amplitude: 0.5 mV
Lead Channel Sensing Intrinsic Amplitude: 10 mV
Lead Channel Setting Pacing Amplitude: 2.5 V
Lead Channel Setting Pacing Amplitude: 2.5 V
Lead Channel Setting Pacing Pulse Width: 0.4 ms
Lead Channel Setting Sensing Sensitivity: 0.3 mV

## 2018-05-21 LAB — POCT INR: INR: 2.2 (ref 2.0–3.0)

## 2018-05-21 NOTE — Progress Notes (Signed)
ICM Remote Transmission scheduled 06/01/2018 to recheck fluid levels.

## 2018-05-21 NOTE — Patient Instructions (Addendum)
Pre visit review using our clinic review tool, if applicable. No additional management support is needed unless otherwise documented below in the visit note.  Continue on  1 pill (2mg ) daily EXCEPT for 1/2 pill (1mg ) on Sunday/Tues/ Thursdays.  Recheck in 1 weeks. Patient will need 3 more weekly visits for Tikosyn loading per cardiology.  Patient says that there is a tentative date of 1/27 for the Tikosyn loading.

## 2018-05-22 ENCOUNTER — Ambulatory Visit (INDEPENDENT_AMBULATORY_CARE_PROVIDER_SITE_OTHER): Payer: PPO | Admitting: Family Medicine

## 2018-05-22 ENCOUNTER — Telehealth: Payer: Self-pay | Admitting: Internal Medicine

## 2018-05-22 VITALS — BP 138/76 | HR 68 | Temp 97.5°F | Ht 66.5 in | Wt 188.0 lb

## 2018-05-22 DIAGNOSIS — I5032 Chronic diastolic (congestive) heart failure: Secondary | ICD-10-CM

## 2018-05-22 DIAGNOSIS — Z7901 Long term (current) use of anticoagulants: Secondary | ICD-10-CM

## 2018-05-22 DIAGNOSIS — N898 Other specified noninflammatory disorders of vagina: Secondary | ICD-10-CM

## 2018-05-22 DIAGNOSIS — R300 Dysuria: Secondary | ICD-10-CM

## 2018-05-22 DIAGNOSIS — N3090 Cystitis, unspecified without hematuria: Secondary | ICD-10-CM

## 2018-05-22 LAB — POC URINALSYSI DIPSTICK (AUTOMATED)
Bilirubin, UA: NEGATIVE
Glucose, UA: NEGATIVE
Ketones, UA: NEGATIVE
Nitrite, UA: NEGATIVE
Protein, UA: NEGATIVE
Spec Grav, UA: 1.015 (ref 1.010–1.025)
Urobilinogen, UA: 0.2 E.U./dL
pH, UA: 6 (ref 5.0–8.0)

## 2018-05-22 MED ORDER — CEPHALEXIN 500 MG PO CAPS: 500.0000 mg | ORAL_CAPSULE | Freq: Two times a day (BID) | ORAL | 0 refills | Status: DC

## 2018-05-22 NOTE — Progress Notes (Signed)
Subjective:    Patient ID: [name], female    DOB: 26-Aug-1933, 85 y.o.   MRN: [id number]  HPI This is an 85 yo female who presents today with an episode of hematuria and dysuria x 1 week. Has history of chronic UTI. Last positive culture 11/19. She has not noticed fever/chills, no new back pain, some increased lower abdominal pressure. She has noticed some yellow vaginal discharge and occasional itch for several weeks.   Having terrible time with recurrent A fib and worsening SOB, has been closely followed by cardiology. Recently added spironolactone. Plan is to admit her for dofetilide initiation at the end of the month. Takes metolazone occasionally at direction of cardiology when fluid overloaded. She has needed to take this week but has been going to so many appointments that she has not been home long enough to take and stay close to bathroom for urinary frequency. She will take tomorrow morning. No chest pain. Increased leg swelling. Daily weights stable.   Anticoagulated with warfarin. Last INR 2.2 on 05/21/18. Has follow up in 4 days.   Past Medical History:  Diagnosis Date  . Adjustment disorder with anxiety   . Anginal pain ([place])   . Arthritis    "some in my hands" (11/11/2012)  . Atrial fibrillation ([place])   . Automatic implantable cardioverter-defibrillator in situ   . Blood in stool   . Carotid artery stenosis 09/2007   60-79% bilateral (stable)  . Carotid artery stenosis 10/2008   40-59% R 60-79%   . Chest pain, unspecified   . Chronic airway obstruction, not elsewhere classified   . Chronic bronchitis ([place])    "get it some; not q year" (11/11/2012)  . Chronic diastolic CHF (congestive heart failure) ([place]) 06/04/2013  . Chronic kidney disease, unspecified   . Coronary artery disease    non-obstructive by 2006 cath  . Dysuria   . Edema   . Encounter for therapeutic drug monitoring   . Exertional shortness of breath   . Frequent UTI    "get them a couple times/yr"  (11/11/2012)  . Hemorrhage of rectum and anus   . High cholesterol   . History of blood transfusion 04/2011   "after hip OR" (11/11/2012)  . Hypertension 05/20/2011  . Hypertr obst cardiomyop   . Hypotension, unspecified    cardiac cath 2006..nonobstructive CAD 30-40s lesions.Marland KitchenETT 1/09 nondiagnostic due to poor HR response..Right Renal Cancer 2003  . Long term (current) use of anticoagulants   . Malignant neoplasm of kidney, except pelvis   . Osteoarthritis of right hip   . Other acute sinusitis   . Other and unspecified coagulation defects   . Other malaise and fatigue   . Other screening mammogram   . PONV (postoperative nausea and vomiting)   . Pre-operative cardiovascular examination   . Renal cancer ([place]) 06/2001   Right  . Secondary cardiomyopathy, unspecified   . Sinus bradycardia   . Special screening for osteoporosis   . Urge incontinence    Past Surgical History:  Procedure Laterality Date  . ABDOMINAL HYSTERECTOMY  1975   for benign causes  . APPENDECTOMY    . BI-VENTRICULAR PACEMAKER UPGRADE  05/04/2010  . CARDIAC CATHETERIZATION  2006  . CARDIOVERSION N/A 02/20/2018   Procedure: CARDIOVERSION;  Surgeon: Minna Merritts, MD;  Location: [facility];  Service: Cardiovascular;  Laterality: N/A;  . CARDIOVERSION N/A 03/27/2018   Procedure: CARDIOVERSION;  Surgeon: Minna Merritts, MD;  Location: [facility];  Service: Cardiovascular;  Laterality: N/A;  . CATARACT EXTRACTION W/ INTRAOCULAR LENS  IMPLANT, BILATERAL  01/2006-02-2006  . CHOLECYSTECTOMY N/A 11/11/2012   Procedure: LAPAROSCOPIC CHOLECYSTECTOMY WITH INTRAOPERATIVE CHOLANGIOGRAM;  Surgeon: Imogene Burn. Georgette Dover, MD;  Location: [place];  Service: General;  Laterality: N/A;  . EP IMPLANTABLE DEVICE N/A 02/21/2016   Procedure: ICD Generator Changeout;  Surgeon: Deboraha Sprang, MD;  Location: [place] [facility];  Service: Cardiovascular;  Laterality: N/A;  . INSERT / REPLACE / REMOVE PACEMAKER  05-01-11    02-28-05-/05-04-10-ICD-MEDTRONIC MAXIMAL DR  . JOINT REPLACEMENT    . LAPAROSCOPIC CHOLECYSTECTOMY  11/11/2012  . LAPAROSCOPIC LYSIS OF ADHESIONS N/A 11/11/2012   Procedure: LAPAROSCOPIC LYSIS OF ADHESIONS;  Surgeon: Imogene Burn. Georgette Dover, MD;  Location: [place];  Service: General;  Laterality: N/A;  . NEPHRECTOMY Right 06/2001    S/P RENAL CELL CANCER  . TOTAL HIP ARTHROPLASTY Right 05/03/2011   Procedure: TOTAL HIP ARTHROPLASTY ANTERIOR APPROACH;  Surgeon: Mcarthur Rossetti;  Location: [facility];  Service: Orthopedics;  Laterality: Right;  Removal of Cannulated Screws Right Hip, Right Direct Anterior Hip Replacement   Family History  Problem Relation Age of Onset  . Heart failure Mother   . Breast cancer Maternal Aunt 82  . Breast cancer Cousin   . Breast cancer Other   . Colon cancer Neg Hx    Social History   Tobacco Use  . Smoking status: Former Smoker    Packs/day: 0.50    Years: 40.00    Pack years: 20.00    Types: Cigarettes    Last attempt to quit: 05/06/2001    Years since quitting: 17.0  . Smokeless tobacco: Never Used  Substance Use Topics  . Alcohol use: No    Alcohol/week: 0.0 standard drinks  . Drug use: No      Review of Systems Per HPI    Objective:   Physical Exam Vitals signs reviewed.  Constitutional:      General: She is not in acute distress.    Appearance: Normal appearance. She is ill-appearing. She is not toxic-appearing or diaphoretic.  HENT:     Head: Normocephalic and atraumatic.  Cardiovascular:     Rate and Rhythm: Rhythm irregular.     Comments: Heart sounds distant.  Pulmonary:     Breath sounds: Normal breath sounds.     Comments: Mild dyspnea with ambulation, resolved with sitting.  Musculoskeletal:        General: Swelling (LE very swollen) present.  Skin:    General: Skin is warm and dry.  Neurological:     Mental Status: She is alert and oriented to person, place, and time.  Psychiatric:        Mood and Affect: Mood normal.         Behavior: Behavior normal.        Thought Content: Thought content normal.        Judgment: Judgment normal.       BP 138/76 (BP Location: Left Arm, Patient Position: Sitting, Cuff Size: Large)   Pulse 68   Temp (!) 97.5 F (36.4 C) (Oral)   Ht 5' 6.5" (1.689 m)   Wt 188 lb (85.3 kg)   SpO2 92%   BMI 29.89 kg/m  Wt Readings from Last 3 Encounters:  05/22/18 188 lb (85.3 kg)  05/19/18 187 lb (84.8 kg)  05/12/18 178 lb 12.7 oz (81.1 kg)   Recheck pulse ox- 97%    Results for orders placed or performed in visit on 05/22/18  POCT Urinalysis Dipstick (Automated)  Result Value Ref Range   Color, UA yellow    Clarity, UA cloudy    Glucose, UA Negative Negative   Bilirubin, UA negative    Ketones, UA negative    Spec Grav, UA 1.015 1.010 - 1.025   Blood, UA 3+    pH, UA 6.0 5.0 - 8.0   Protein, UA Negative Negative   Urobilinogen, UA 0.2 0.2 or 1.0 E.U./dL   Nitrite, UA negative    Leukocytes, UA Large (3+) (A) Negative    Assessment & Plan:  1. Dysuria - POCT Urinalysis Dipstick (Automated) - Urine Culture  2. Cystitis - Provided written and verbal information regarding diagnosis and treatment. - RTC precautions reviewed - cephALEXin (KEFLEX) 500 MG capsule; Take 1 capsule (500 mg total) by mouth every 12 (twelve) hours.  Dispense: 10 capsule; Refill: 0 - Urine Culture  3. Vaginal discharge - self collected wet prep - WET PREP BY MOLECULAR PROBE  4. Long term (current) use of anticoagulants - no s/s bleeding, has inr recheck scheduled for 4 days, discussed potential for increased bleeding with antibioitc  5. Chronic diastolic CHF (congestive heart failure) ([place]) - following closely with cardiology - she will take metolazone tomorrow morning    Clarene Reamer, FNP-BC  [facility], [place]  05/23/2018 7:37 AM

## 2018-05-22 NOTE — Patient Instructions (Signed)
Good to see you today!  

## 2018-05-22 NOTE — Telephone Encounter (Signed)
Patient wants to talk with Nira Conn about [facility]

## 2018-05-23 ENCOUNTER — Encounter: Payer: Self-pay | Admitting: Family Medicine

## 2018-05-23 LAB — WET PREP BY MOLECULAR PROBE
Candida species: NOT DETECTED
Gardnerella vaginalis: NOT DETECTED
MICRO NUMBER:: 71183
SPECIMEN QUALITY:: ADEQUATE
Trichomonas vaginosis: NOT DETECTED

## 2018-05-23 LAB — URINE CULTURE
MICRO NUMBER:: 71181
SPECIMEN QUALITY:: ADEQUATE

## 2018-05-25 NOTE — Telephone Encounter (Signed)
Patient calling Would like to know more about the tikosyn admission Please call to discuss

## 2018-05-25 NOTE — Telephone Encounter (Signed)
Attempted to reach the patient again. No answer and no voicemail.

## 2018-05-25 NOTE — Telephone Encounter (Signed)
Call returned to the patient. Unable to leave a voicemail.

## 2018-05-26 ENCOUNTER — Ambulatory Visit (INDEPENDENT_AMBULATORY_CARE_PROVIDER_SITE_OTHER): Payer: PPO

## 2018-05-26 DIAGNOSIS — Z7901 Long term (current) use of anticoagulants: Secondary | ICD-10-CM

## 2018-05-26 DIAGNOSIS — Z7902 Long term (current) use of antithrombotics/antiplatelets: Secondary | ICD-10-CM

## 2018-05-26 LAB — POCT INR: INR: 2 (ref 2.0–3.0)

## 2018-05-26 NOTE — Telephone Encounter (Signed)
[name],  Per Dr. Caryl Comes, the patient can hold metolazone as needed. She really does not tolerate her a-fib all that well and has been extremely SOB. I spoke with Dr. Caryl Comes and he is ok with her getting loaded with Tikosyn with the INR's she has on file.  She has not been subtherapeutic since 04/22/18. I know she has one scheduled with her PCP on 1/30, but Dr. Caryl Comes wants to go ahead and get her going if possible.   Thanks!

## 2018-05-26 NOTE — Telephone Encounter (Signed)
The a-fib clinic has been in touch with the patient and she is scheduled for possible Tikosyn admission on 06/01/18.

## 2018-05-26 NOTE — Telephone Encounter (Signed)
Patient for tikosyn admit 1/27 she will have INR check prior to arrival.

## 2018-05-26 NOTE — Patient Instructions (Signed)
INR today 2.0   Continue on  1 pill (2mg ) daily EXCEPT for 1/2 pill (1mg ) on Sunday/Tues/ Thursdays.  Recheck in 1 weeks. Patient will need 2 more weekly visits for Tikosyn loading per cardiology.  Patient says that there is a tentative date of 1/27 for the Tikosyn loading.

## 2018-05-28 ENCOUNTER — Telehealth: Payer: Self-pay | Admitting: Internal Medicine

## 2018-05-28 NOTE — Telephone Encounter (Signed)
Pt has a question regarding her INR check at [facility], her PCP. Please call to discuss.

## 2018-05-28 NOTE — Telephone Encounter (Signed)
I attempted to call the patient at her home #- no answer x multiple rings.  I attempted to call her on her cell #- no voice mail set up.   Will call back at a later time.

## 2018-05-29 NOTE — Telephone Encounter (Signed)
I attempted to call the patient again on her home & cell #'s. No answer & no voice mail at either #. She is scheduled to follow up in the a-fib clinic on 06/01/18 for possible Tikosyn admission.

## 2018-05-29 NOTE — Telephone Encounter (Signed)
I attempted to call the patient at her home & cell #'s again.   No answer/ no voicemail at either number.

## 2018-06-01 ENCOUNTER — Ambulatory Visit (INDEPENDENT_AMBULATORY_CARE_PROVIDER_SITE_OTHER): Payer: PPO

## 2018-06-01 ENCOUNTER — Ambulatory Visit (HOSPITAL_COMMUNITY)
Admission: RE | Admit: 2018-06-01 | Discharge: 2018-06-01 | Disposition: A | Discharge status: Home / Self Care | Payer: PPO | Source: Ambulatory Visit | Attending: Nurse Practitioner | Admitting: Nurse Practitioner

## 2018-06-01 ENCOUNTER — Other Ambulatory Visit: Payer: Self-pay

## 2018-06-01 ENCOUNTER — Inpatient Hospital Stay (HOSPITAL_COMMUNITY)
Admission: AD | Admit: 2018-06-01 | Discharge: 2018-06-04 | DRG: 309 | Disposition: A | Discharge status: Home / Self Care | Payer: PPO | Source: Ambulatory Visit | Attending: Internal Medicine | Admitting: Internal Medicine

## 2018-06-01 ENCOUNTER — Encounter (HOSPITAL_COMMUNITY): Payer: Self-pay | Admitting: Nurse Practitioner

## 2018-06-01 VITALS — BP 140/54 | HR 62 | Ht 66.5 in | Wt 182.0 lb

## 2018-06-01 DIAGNOSIS — I4819 Other persistent atrial fibrillation: Principal | ICD-10-CM | POA: Diagnosis present

## 2018-06-01 DIAGNOSIS — E7800 Pure hypercholesterolemia, unspecified: Secondary | ICD-10-CM | POA: Diagnosis not present

## 2018-06-01 DIAGNOSIS — Z905 Acquired absence of kidney: Secondary | ICD-10-CM

## 2018-06-01 DIAGNOSIS — N183 Chronic kidney disease, stage 3 (moderate): Secondary | ICD-10-CM | POA: Diagnosis present

## 2018-06-01 DIAGNOSIS — Z9841 Cataract extraction status, right eye: Secondary | ICD-10-CM

## 2018-06-01 DIAGNOSIS — I442 Atrioventricular block, complete: Secondary | ICD-10-CM | POA: Diagnosis not present

## 2018-06-01 DIAGNOSIS — I5032 Chronic diastolic (congestive) heart failure: Secondary | ICD-10-CM

## 2018-06-01 DIAGNOSIS — Z9049 Acquired absence of other specified parts of digestive tract: Secondary | ICD-10-CM | POA: Diagnosis not present

## 2018-06-01 DIAGNOSIS — Z803 Family history of malignant neoplasm of breast: Secondary | ICD-10-CM

## 2018-06-01 DIAGNOSIS — Z882 Allergy status to sulfonamides status: Secondary | ICD-10-CM

## 2018-06-01 DIAGNOSIS — Z85528 Personal history of other malignant neoplasm of kidney: Secondary | ICD-10-CM | POA: Diagnosis not present

## 2018-06-01 DIAGNOSIS — Z7901 Long term (current) use of anticoagulants: Secondary | ICD-10-CM

## 2018-06-01 DIAGNOSIS — F4322 Adjustment disorder with anxiety: Secondary | ICD-10-CM | POA: Diagnosis not present

## 2018-06-01 DIAGNOSIS — I2510 Atherosclerotic heart disease of native coronary artery without angina pectoris: Secondary | ICD-10-CM | POA: Diagnosis present

## 2018-06-01 DIAGNOSIS — Z961 Presence of intraocular lens: Secondary | ICD-10-CM | POA: Diagnosis not present

## 2018-06-01 DIAGNOSIS — Z885 Allergy status to narcotic agent status: Secondary | ICD-10-CM | POA: Diagnosis not present

## 2018-06-01 DIAGNOSIS — I4892 Unspecified atrial flutter: Secondary | ICD-10-CM | POA: Diagnosis present

## 2018-06-01 DIAGNOSIS — I081 Rheumatic disorders of both mitral and tricuspid valves: Secondary | ICD-10-CM | POA: Diagnosis present

## 2018-06-01 DIAGNOSIS — J449 Chronic obstructive pulmonary disease, unspecified: Secondary | ICD-10-CM | POA: Diagnosis not present

## 2018-06-01 DIAGNOSIS — Z9842 Cataract extraction status, left eye: Secondary | ICD-10-CM

## 2018-06-01 DIAGNOSIS — Z96641 Presence of right artificial hip joint: Secondary | ICD-10-CM | POA: Diagnosis not present

## 2018-06-01 DIAGNOSIS — Z90710 Acquired absence of both cervix and uterus: Secondary | ICD-10-CM

## 2018-06-01 DIAGNOSIS — I4891 Unspecified atrial fibrillation: Secondary | ICD-10-CM | POA: Diagnosis present

## 2018-06-01 DIAGNOSIS — I130 Hypertensive heart and chronic kidney disease with heart failure and stage 1 through stage 4 chronic kidney disease, or unspecified chronic kidney disease: Secondary | ICD-10-CM | POA: Diagnosis not present

## 2018-06-01 DIAGNOSIS — I428 Other cardiomyopathies: Secondary | ICD-10-CM | POA: Diagnosis not present

## 2018-06-01 DIAGNOSIS — Z87891 Personal history of nicotine dependence: Secondary | ICD-10-CM

## 2018-06-01 DIAGNOSIS — I495 Sick sinus syndrome: Secondary | ICD-10-CM | POA: Diagnosis present

## 2018-06-01 DIAGNOSIS — I5033 Acute on chronic diastolic (congestive) heart failure: Secondary | ICD-10-CM | POA: Diagnosis not present

## 2018-06-01 DIAGNOSIS — Z95810 Presence of automatic (implantable) cardiac defibrillator: Secondary | ICD-10-CM

## 2018-06-01 DIAGNOSIS — Z8249 Family history of ischemic heart disease and other diseases of the circulatory system: Secondary | ICD-10-CM

## 2018-06-01 DIAGNOSIS — Z7902 Long term (current) use of antithrombotics/antiplatelets: Secondary | ICD-10-CM

## 2018-06-01 DIAGNOSIS — I272 Pulmonary hypertension, unspecified: Secondary | ICD-10-CM | POA: Diagnosis not present

## 2018-06-01 DIAGNOSIS — I482 Chronic atrial fibrillation, unspecified: Secondary | ICD-10-CM | POA: Diagnosis present

## 2018-06-01 DIAGNOSIS — Z7951 Long term (current) use of inhaled steroids: Secondary | ICD-10-CM

## 2018-06-01 LAB — BASIC METABOLIC PANEL
Anion gap: 9 (ref 5–15)
Anion gap: 10 (ref 5–15)
BUN: 18 mg/dL (ref 8–23)
BUN: 20 mg/dL (ref 8–23)
CO2: 30 mmol/L (ref 22–32)
CO2: 29 mmol/L (ref 22–32)
Calcium: 9.2 mg/dL (ref 8.9–10.3)
Calcium: 9.3 mg/dL (ref 8.9–10.3)
Chloride: 99 mmol/L (ref 98–111)
Chloride: 99 mmol/L (ref 98–111)
Creatinine, Ser: 1.52 mg/dL — ABNORMAL HIGH (ref 0.44–1.00)
Creatinine, Ser: 1.51 mg/dL — ABNORMAL HIGH (ref 0.44–1.00)
GFR calc Af Amer: 37 mL/min — ABNORMAL LOW (ref >60)
GFR calc Af Amer: 37 mL/min — ABNORMAL LOW (ref >60)
GFR calc non Af Amer: 32 mL/min — ABNORMAL LOW (ref >60)
GFR calc non Af Amer: 32 mL/min — ABNORMAL LOW (ref >60)
Glucose, Bld: 102 mg/dL — ABNORMAL HIGH (ref 70–99)
Glucose, Bld: 115 mg/dL — ABNORMAL HIGH (ref 70–99)
Potassium: 3.5 mmol/L (ref 3.5–5.1)
Potassium: 4.4 mmol/L (ref 3.5–5.1)
Sodium: 138 mmol/L (ref 135–145)
Sodium: 138 mmol/L (ref 135–145)

## 2018-06-01 LAB — MAGNESIUM: Magnesium: 2.3 mg/dL (ref 1.7–2.4)

## 2018-06-01 LAB — POCT INR: INR: 2.3 (ref 2.0–3.0)

## 2018-06-01 MED ORDER — SODIUM CHLORIDE 0.9% FLUSH: 3.0000 mL | INTRAVENOUS | Status: DC | PRN

## 2018-06-01 MED ORDER — CARVEDILOL 6.25 MG PO TABS
6.2500 mg | ORAL_TABLET | Freq: Every morning | ORAL | Status: DC
  Administered 2018-06-02 – 2018-06-04 (×3): 6.25 mg via ORAL
  Filled 2018-06-01 (×4): qty 1

## 2018-06-01 MED ORDER — EZETIMIBE 10 MG PO TABS
10.0000 mg | ORAL_TABLET | Freq: Every day | ORAL | Status: DC
  Administered 2018-06-02 – 2018-06-04 (×3): 10 mg via ORAL
  Filled 2018-06-01 (×3): qty 1

## 2018-06-01 MED ORDER — POTASSIUM CHLORIDE CRYS ER 20 MEQ PO TBCR
20.0000 meq | EXTENDED_RELEASE_TABLET | Freq: Every day | ORAL | Status: DC
  Administered 2018-06-02 – 2018-06-04 (×3): 20 meq via ORAL
  Filled 2018-06-01 (×4): qty 1

## 2018-06-01 MED ORDER — DOFETILIDE 125 MCG PO CAPS
125.0000 ug | ORAL_CAPSULE | Freq: Two times a day (BID) | ORAL | Status: DC
  Administered 2018-06-01 – 2018-06-04 (×6): 125 ug via ORAL
  Filled 2018-06-01 (×6): qty 1

## 2018-06-01 MED ORDER — SPIRONOLACTONE 25 MG PO TABS
25.0000 mg | ORAL_TABLET | Freq: Every day | ORAL | Status: DC
  Administered 2018-06-02 – 2018-06-04 (×3): 25 mg via ORAL
  Filled 2018-06-01 (×3): qty 1

## 2018-06-01 MED ORDER — WARFARIN SODIUM 2 MG PO TABS
2.0000 mg | ORAL_TABLET | Freq: Once | ORAL | Status: AC
  Administered 2018-06-01: 2 mg via ORAL
  Filled 2018-06-01: qty 1

## 2018-06-01 MED ORDER — SODIUM CHLORIDE 0.9% FLUSH
3.0000 mL | Freq: Two times a day (BID) | INTRAVENOUS | Status: DC
  Administered 2018-06-01 – 2018-06-02 (×3): 3 mL via INTRAVENOUS

## 2018-06-01 MED ORDER — ACETAMINOPHEN 500 MG PO TABS: 1000.0000 mg | ORAL_TABLET | Freq: Every day | ORAL | Status: DC | PRN

## 2018-06-01 MED ORDER — TORSEMIDE 20 MG PO TABS
20.0000 mg | ORAL_TABLET | Freq: Every day | ORAL | Status: DC
  Filled 2018-06-01: qty 1

## 2018-06-01 MED ORDER — ROSUVASTATIN CALCIUM 20 MG PO TABS
40.0000 mg | ORAL_TABLET | Freq: Every day | ORAL | Status: DC
  Administered 2018-06-02 – 2018-06-04 (×3): 40 mg via ORAL
  Filled 2018-06-01 (×4): qty 2

## 2018-06-01 MED ORDER — SODIUM CHLORIDE 0.9 % IV SOLN: 250.0000 mL | INTRAVENOUS | Status: DC | PRN

## 2018-06-01 MED ORDER — WARFARIN - PHARMACIST DOSING INPATIENT
Freq: Every day | Status: DC
  Administered 2018-06-02: 18:00:00

## 2018-06-01 MED ORDER — POTASSIUM CHLORIDE CRYS ER 20 MEQ PO TBCR
20.0000 meq | EXTENDED_RELEASE_TABLET | Freq: Once | ORAL | Status: AC
  Administered 2018-06-01: 20 meq via ORAL

## 2018-06-01 MED ORDER — WARFARIN SODIUM 1 MG PO TABS: 1.0000 mg | ORAL_TABLET | ORAL | Status: DC

## 2018-06-01 NOTE — H&P (Addendum)
Cardiology Admission History and Physical:   Patient ID: [name] MRN: [id number]; DOB: 1933-07-24   Admission date: 06/01/2018  Primary Care Provider: Elby Beck, FNP Primary Cardiologist/ EP: Virl Axe, MD    Chief Complaint:  Persistent atrial fibrillation   Patient Profile:   [name] is a 85 y.o. female with persistent atrial fibrillation, intolerant to amiodarone, on chronic anticoagulation w/ Coumadin, admitted for Tikosyn loading.   History of Present Illness:   [name] is followed by Dr. Caryl Comes. She has a h/o persistent atrial fibrillation and intolerant to amiodarone. She is on chronic anticoagulation w/ Coumadin. She has been seen in the Atrial Fibrillation clinic and decision made to try Tikosyn. She was seen in the Afib clinic today. K was 3.5. Pt advised to take 40 mEq of Kdur prior to direct admit to [facility].   She is here and in a room. VSS. Denies CP and dyspnea. Daughter at bedside. Labs for repeat BMP have been ordered.  \\  Cath 2006 without obstructive disease DATE TEST EF   1/15 Echo  65 % Mod Pulm HTN, TR and Mild MR   4/16 Echo   65 % Mod-sev PulmHTN with RVE, Mod MR severe TR  6/17 Echo  65% Mild-mod MR  12/18 Myoview 65% No ischemia         Anticoagulation with warfarin.         Date Cr K Hgb TSH LFTs  12/17 1.63 3.5 9.5    7/18 1.5 4.0 10.4    5/19 1.42 4.2 10.6    10/19 2.08 2.8 9.3 7.28   10/19 1.81 3.1 9.1    12/19 1.68 3.5 8.7                  Past Medical History:  Diagnosis Date  . Adjustment disorder with anxiety   . Anginal pain ([place])   . Arthritis    "some in my hands" (11/11/2012)  . Atrial fibrillation ([place])   . Automatic implantable cardioverter-defibrillator in situ   . Blood in stool   . Carotid artery stenosis 09/2007   60-79% bilateral (stable)  . Carotid artery stenosis 10/2008   40-59% R 60-79%   . Chest pain, unspecified   . Chronic airway obstruction, not  elsewhere classified   . Chronic bronchitis ([place])    "get it some; not q year" (11/11/2012)  . Chronic diastolic CHF (congestive heart failure) ([place]) 06/04/2013  . Chronic kidney disease, unspecified   . Coronary artery disease    non-obstructive by 2006 cath  . Dysuria   . Edema   . Encounter for therapeutic drug monitoring   . Exertional shortness of breath   . Frequent UTI    "get them a couple times/yr" (11/11/2012)  . Hemorrhage of rectum and anus   . High cholesterol   . History of blood transfusion 04/2011   "after hip OR" (11/11/2012)  . Hypertension 05/20/2011  . Hypertr obst cardiomyop   . Hypotension, unspecified    cardiac cath 2006..nonobstructive CAD 30-40s lesions.Marland KitchenETT 1/09 nondiagnostic due to poor HR response..Right Renal Cancer 2003  . Long term (current) use of anticoagulants   . Malignant neoplasm of kidney, except pelvis   . Osteoarthritis of right hip   . Other acute sinusitis   . Other and unspecified coagulation defects   . Other malaise and fatigue   . Other screening mammogram   . PONV (postoperative nausea and vomiting)   . Pre-operative cardiovascular examination   .  Renal cancer ([place]) 06/2001   Right  . Secondary cardiomyopathy, unspecified   . Sinus bradycardia   . Special screening for osteoporosis   . Urge incontinence     Past Surgical History:  Procedure Laterality Date  . ABDOMINAL HYSTERECTOMY  1975   for benign causes  . APPENDECTOMY    . BI-VENTRICULAR PACEMAKER UPGRADE  05/04/2010  . CARDIAC CATHETERIZATION  2006  . CARDIOVERSION N/A 02/20/2018   Procedure: CARDIOVERSION;  Surgeon: Minna Merritts, MD;  Location: [facility];  Service: Cardiovascular;  Laterality: N/A;  . CARDIOVERSION N/A 03/27/2018   Procedure: CARDIOVERSION;  Surgeon: Minna Merritts, MD;  Location: [facility];  Service: Cardiovascular;  Laterality: N/A;  . CATARACT EXTRACTION W/ INTRAOCULAR LENS  IMPLANT, BILATERAL  01/2006-02-2006  . CHOLECYSTECTOMY N/A 11/11/2012    Procedure: LAPAROSCOPIC CHOLECYSTECTOMY WITH INTRAOPERATIVE CHOLANGIOGRAM;  Surgeon: Imogene Burn. Georgette Dover, MD;  Location: [facility];  Service: General;  Laterality: N/A;  . EP IMPLANTABLE DEVICE N/A 02/21/2016   Procedure: ICD Generator Changeout;  Surgeon: Deboraha Sprang, MD;  Location: [facility];  Service: Cardiovascular;  Laterality: N/A;  . INSERT / REPLACE / REMOVE PACEMAKER  05-01-11   02-28-05-/05-04-10-ICD-MEDTRONIC MAXIMAL DR  . JOINT REPLACEMENT    . LAPAROSCOPIC CHOLECYSTECTOMY  11/11/2012  . LAPAROSCOPIC LYSIS OF ADHESIONS N/A 11/11/2012   Procedure: LAPAROSCOPIC LYSIS OF ADHESIONS;  Surgeon: Imogene Burn. Georgette Dover, MD;  Location: [facility];  Service: General;  Laterality: N/A;  . NEPHRECTOMY Right 06/2001    S/P RENAL CELL CANCER  . TOTAL HIP ARTHROPLASTY Right 05/03/2011   Procedure: TOTAL HIP ARTHROPLASTY ANTERIOR APPROACH;  Surgeon: Mcarthur Rossetti;  Location[facility];  Service: Orthopedics;  Laterality: Right;  Removal of Cannulated Screws Right Hip, Right Direct Anterior Hip Replacement     Medications Prior to Admission: Prior to Admission medications   Medication Sig Start Date End Date Taking? Authorizing Provider  acetaminophen (TYLENOL) 500 MG tablet Take 1,000 mg by mouth daily as needed for headache.     [provider]  carvedilol (COREG) 12.5 MG tablet Take 1/2 tablet (6.25 mg) once daily in the morning 04/10/18   Deboraha Sprang, MD  cetirizine (ZYRTEC) 10 MG tablet Take 10 mg by mouth daily.     [provider]  CRANBERRY PO Take 4 tablets by mouth daily.    [provider]  ezetimibe (ZETIA) 10 MG tablet Take 1 tablet (10 mg total) by mouth daily. 08/18/17   Minna Merritts, MD  fluticasone (FLONASE) 50 MCG/ACT nasal spray Place 2 sprays into both nostrils daily. Patient not taking: Reported on 06/01/2018 04/22/18   Elby Beck, FNP  hydrocortisone 2.5 % cream Apply 1 application topically daily as needed (itching).  02/04/18   [provider]  potassium chloride SA (K-DUR,KLOR-CON) 20 MEQ tablet Take 1 tablet (20 meq) by mouth once daily Patient not taking: Reported on 06/01/2018 02/24/18   Deboraha Sprang, MD  rosuvastatin (CRESTOR) 40 MG tablet Take 1 tablet (40 mg total) by mouth daily. 11/11/16   Minna Merritts, MD  spironolactone (ALDACTONE) 25 MG tablet Take 1 tablet (25 mg total) by mouth daily. 05/19/18 08/17/18  Deboraha Sprang, MD  torsemide (DEMADEX) 20 MG tablet Take 2 tablets (40 mg) by mouth once daily Patient taking differently: Take 20-40 mg by mouth See admin instructions. Take 1 tablet (20mg ) daily -- may repeat if needed for swelling 02/24/18   Deboraha Sprang, MD  warfarin (COUMADIN) 2  MG tablet Take 1 1/2 tablets daily or AS DIRECTED BY ANTICOAGULATION CLINIC. Patient taking differently: Take 1-2 mg by mouth See admin instructions. Take  tablet (1mg ) by mouth every Sunday, Tuesday and Thursday evening and take 1 tablet (2mg ) by mouth every Monday, Wednesday, Friday and Saturday evening 12/29/17   Elby Beck, FNP     Allergies:    Allergies  Allergen Reactions  . Codeine Nausea And Vomiting  . Morphine And Related Nausea And Vomiting  . Sulfonamide Derivatives Other (See Comments)    Dry mouth    Social History:   Social History   Socioeconomic History  . Marital status: Widowed    Spouse name: Not on file  . Number of children: Not on file  . Years of education: Not on file  . Highest education level: Not on file  Occupational History  . Occupation: Retired    Fish farm manager: RETIRED  Social Needs  . Financial resource strain: Not on file  . Food insecurity:    Worry: Not on file    Inability: Not on file  . Transportation needs:    Medical: Not on file    Non-medical: Not on file  Tobacco Use  . Smoking status: Former Smoker    Packs/day: 0.50    Years: 40.00    Pack years: 20.00    Types: Cigarettes    Last attempt to quit: 05/06/2001    Years since quitting: 17.0  .  Smokeless tobacco: Never Used  Substance and Sexual Activity  . Alcohol use: No    Alcohol/week: 0.0 standard drinks  . Drug use: No  . Sexual activity: Never  Lifestyle  . Physical activity:    Days per week: Not on file    Minutes per session: Not on file  . Stress: Not on file  Relationships  . Social connections:    Talks on phone: Not on file    Gets together: Not on file    Attends religious service: Not on file    Active member of club or organization: Not on file    Attends meetings of clubs or organizations: Not on file    Relationship status: Not on file  . Intimate partner violence:    Fear of current or ex partner: Not on file    Emotionally abused: Not on file    Physically abused: Not on file    Forced sexual activity: Not on file  Other Topics Concern  . 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   ALCOHOL USE-NO   RETIRED   FORMER TOBACCO USE.....1/2 PACK X 40 YRS UNTIL 2003   Would desire CPR    Family History:   The patient's family history includes Breast cancer in her cousin and another family member; Breast cancer (age of onset: 13) in her maternal aunt; Heart failure in her mother. There is no history of Colon cancer.    ROS:  Please see the history of present illness.  All other ROS reviewed and negative.     Physical Exam/Data:  There were no vitals filed for this visit. No intake or output data in the 24 hours ending 06/01/18 1752 Last 3 Weights 06/01/2018 05/22/2018 05/19/2018  Weight (lbs) 182 lb 188 lb 187 lb  Weight (kg) 82.555 kg 85.276 kg  84.823 kg     There is no height or weight on file to calculate BMI.  General:  Elderly WF, Well nourished, well developed, in no acute distress HEENT: normal Lymph: no adenopathy Neck: <10 JVD Endocrine:  No thryomegaly Vascular: No carotid  bruits; FA pulses 2+ bilaterally without bruits  Cardiac: irregular rhythm, regular rate Lungs:  clear to auscultation bilaterally, no wheezing, rhonchi or rales  Abd: soft, nontender, no hepatomegaly  Ext: 1-2+ Musculoskeletal:  No deformities, BUE and BLE strength normal and equal Skin: warm and dry  Neuro:  CNs 2-12 intact, no focal abnormalities noted Psych:  Normal affect    EKG:  The ECG that was done 06/01/18 was personally reviewed and demonstrates V paced rhythm, QT/QTc 530/537 ms  Relevant CV Studies: 2D Echo 10/2015 Study Conclusions  - Left ventricle: The cavity size was normal. Systolic function was   normal. The estimated ejection fraction was in the range of 60%   to 65%. Wall motion was normal; there were no regional wall   motion abnormalities. The study is not technically sufficient to   allow evaluation of LV diastolic function. - Aortic valve: There was trivial regurgitation. - Mitral valve: There was mild to moderate regurgitation. - Left atrium: The atrium was mildly dilated. - Right ventricle: Pacer wire or catheter noted in right ventricle. - Pulmonary arteries: Systolic pressure was severely elevated. PA   peak pressure: 70 mm Hg (S). - Inferior vena cava: The vessel was dilated. The respirophasic   diameter changes were blunted (< 50%), consistent with elevated   central venous pressure.   Laboratory Data:  Chemistry Recent Labs  Lab 06/01/18 1138  NA 138  K 3.5  CL 99  CO2 30  GLUCOSE 102*  BUN 18  CREATININE 1.52*  CALCIUM 9.2  GFRNONAA 32*  GFRAA 37*  ANIONGAP 9    No results for input(s): PROT, ALBUMIN, AST, ALT, ALKPHOS, BILITOT in the last 168 hours. HematologyNo results for input(s): WBC, RBC, HGB, HCT, MCV, MCH, MCHC, RDW, PLT in the last 168 hours. Cardiac EnzymesNo results for input(s): TROPONINI in the last 168 hours. No results for input(s): TROPIPOC in the last 168 hours.  BNPNo results for input(s): BNP, PROBNP in the last  168 hours.  DDimer No results for input(s): DDIMER in the last 168 hours.  Radiology/Studies:  No results found.  Assessment and Plan:   [name] is a 85 y.o. female with persistent atrial fibrillation, intolerant to amiodarone, on chronic anticoagulation w/ Coumadin, admitted for Tikosyn loading.   1. Persistent Atrial Fibrillation: admitted for Tikosyn loading. Repeat BMP now. Start 1st dose of Tikosyn once K is > 4.0. Plan to follow Tikosyn order set. Dr. Caryl Comes to see. Pharmacy also following labs.  Keep K > 4.0. Mg > 2.0. Serial EKGs after Tikosyn doses. Monitor on tele.   For questions or updates, please contact [facility] Please consult [email or web address] for contact info under        Signed, Lyda Jester, PA-C  06/01/2018 5:52 PM    As above    Atrial fibrillation/flutter persistent  Nonischemic cardiomyopathy with interval normalization of LV function  Sinus node dysfunction device dependent   Mitral regurgitation  Complete heart block intermittent  Congestive heart failure chronic/diastolic class IIb-IIIa  Chronic Renal insufficiency grade 3   Dizziness   Implantable defibrillator   She has diuresed nicely over the last few days.  Surprisingly her renal function remains quite good.  We will continue  diuresis and begin dofetilide.  K is on low side 2/2 aggressive diuresis

## 2018-06-01 NOTE — Patient Instructions (Signed)
INR today 2.3   *patient going for a cardioversion (Tikosyn loading) this am.  Will continue to check weekly X 4 weeks post procedure.   Continue on  1 pill (2mg ) daily EXCEPT for 1/2 pill (1mg ) on Sunday/Tues/ Thursdays.  Recheck in 1 weeks. Patient will need 2 more weekly visits for Tikosyn loading per cardiology.  Patient says that there is a tentative date of 1/27 for the Tikosyn loading.

## 2018-06-01 NOTE — Progress Notes (Addendum)
Primary Care Physician: Elby Beck, FNP Referring Physician: Dr.Klein    [name] is a 85 y.o. female with a h/o persistent afib, CHF that is in the afib clinic for dofetilide admit. Her INR this am was therapeutic at 2.3, Dr. Caryl Comes was OK for her just having the last 3 weeks of therapeutic INR's.  No benadryl use. She is aware of the cost of the drug.She did use amiodarone briefly. Last used 03/27/18. This was cleared with Dr. Caryl Comes as he is ok for her to come in for dofetilide  as she used a very short amount of time.  Today, she denies symptoms of palpitations, chest pain, shortness of breath, orthopnea, PND, lower extremity edema, dizziness, presyncope, syncope, or neurologic sequela. The patient is tolerating medications without difficulties and is otherwise without complaint today.   Past Medical History:  Diagnosis Date  . Adjustment disorder with anxiety   . Anginal pain ([place])   . Arthritis    "some in my hands" (11/11/2012)  . Atrial fibrillation ([place])   . Automatic implantable cardioverter-defibrillator in situ   . Blood in stool   . Carotid artery stenosis 09/2007   60-79% bilateral (stable)  . Carotid artery stenosis 10/2008   40-59% R 60-79%   . Chest pain, unspecified   . Chronic airway obstruction, not elsewhere classified   . Chronic bronchitis ([place])    "get it some; not q year" (11/11/2012)  . Chronic diastolic CHF (congestive heart failure) ([place]) 06/04/2013  . Chronic kidney disease, unspecified   . Coronary artery disease    non-obstructive by 2006 cath  . Dysuria   . Edema   . Encounter for therapeutic drug monitoring   . Exertional shortness of breath   . Frequent UTI    "get them a couple times/yr" (11/11/2012)  . Hemorrhage of rectum and anus   . High cholesterol   . History of blood transfusion 04/2011   "after hip OR" (11/11/2012)  . Hypertension 05/20/2011  . Hypertr obst cardiomyop   . Hypotension, unspecified    cardiac cath  2006..nonobstructive CAD 30-40s lesions.Marland KitchenETT 1/09 nondiagnostic due to poor HR response..Right Renal Cancer 2003  . Long term (current) use of anticoagulants   . Malignant neoplasm of kidney, except pelvis   . Osteoarthritis of right hip   . Other acute sinusitis   . Other and unspecified coagulation defects   . Other malaise and fatigue   . Other screening mammogram   . PONV (postoperative nausea and vomiting)   . Pre-operative cardiovascular examination   . Renal cancer ([place]) 06/2001   Right  . Secondary cardiomyopathy, unspecified   . Sinus bradycardia   . Special screening for osteoporosis   . Urge incontinence    Past Surgical History:  Procedure Laterality Date  . ABDOMINAL HYSTERECTOMY  1975   for benign causes  . APPENDECTOMY    . BI-VENTRICULAR PACEMAKER UPGRADE  05/04/2010  . CARDIAC CATHETERIZATION  2006  . CARDIOVERSION N/A 02/20/2018   Procedure: CARDIOVERSION;  Surgeon: Minna Merritts, MD;  Location: [facility];  Service: Cardiovascular;  Laterality: N/A;  . CARDIOVERSION N/A 03/27/2018   Procedure: CARDIOVERSION;  Surgeon: Minna Merritts, MD;  Location: [facility];  Service: Cardiovascular;  Laterality: N/A;  . CATARACT EXTRACTION W/ INTRAOCULAR LENS  IMPLANT, BILATERAL  01/2006-02-2006  . CHOLECYSTECTOMY N/A 11/11/2012   Procedure: LAPAROSCOPIC CHOLECYSTECTOMY WITH INTRAOPERATIVE CHOLANGIOGRAM;  Surgeon: Imogene Burn. Georgette Dover, MD;  Location: [facility];  Service: General;  Laterality: N/A;  .  EP IMPLANTABLE DEVICE N/A 02/21/2016   Procedure: ICD Generator Changeout;  Surgeon: Deboraha Sprang, MD;  Location: [facility];  Service: Cardiovascular;  Laterality: N/A;  . INSERT / REPLACE / REMOVE PACEMAKER  05-01-11   02-28-05-/05-04-10-ICD-MEDTRONIC MAXIMAL DR  . JOINT REPLACEMENT    . LAPAROSCOPIC CHOLECYSTECTOMY  11/11/2012  . LAPAROSCOPIC LYSIS OF ADHESIONS N/A 11/11/2012   Procedure: LAPAROSCOPIC LYSIS OF ADHESIONS;  Surgeon: Imogene Burn. Georgette Dover, MD;  Location: [facility];   Service: General;  Laterality: N/A;  . NEPHRECTOMY Right 06/2001    S/P RENAL CELL CANCER  . TOTAL HIP ARTHROPLASTY Right 05/03/2011   Procedure: TOTAL HIP ARTHROPLASTY ANTERIOR APPROACH;  Surgeon: Mcarthur Rossetti;  Location: [facility];  Service: Orthopedics;  Laterality: Right;  Removal of Cannulated Screws Right Hip, Right Direct Anterior Hip Replacement    Current Outpatient Medications  Medication Sig Dispense Refill  . acetaminophen (TYLENOL) 500 MG tablet Take 1,000 mg by mouth daily as needed for headache.     . carvedilol (COREG) 12.5 MG tablet Take 1/2 tablet (6.25 mg) once daily in the morning 180 tablet 3  . cetirizine (ZYRTEC) 10 MG tablet Take 10 mg by mouth daily.     Marland Kitchen CRANBERRY PO Take 4 tablets by mouth daily.    Marland Kitchen ezetimibe (ZETIA) 10 MG tablet Take 1 tablet (10 mg total) by mouth daily. 30 tablet 11  . hydrocortisone 2.5 % cream Apply 1 application topically daily as needed (itching).     . rosuvastatin (CRESTOR) 40 MG tablet Take 1 tablet (40 mg total) by mouth daily. 90 tablet 3  . spironolactone (ALDACTONE) 25 MG tablet Take 1 tablet (25 mg total) by mouth daily. 30 tablet 3  . torsemide (DEMADEX) 20 MG tablet Take 2 tablets (40 mg) by mouth once daily (Patient taking differently: Take 20-40 mg by mouth See admin instructions. Take 1 tablet (20mg ) daily -- may repeat if needed for swelling)    . warfarin (COUMADIN) 2 MG tablet Take 1 1/2 tablets daily or AS DIRECTED BY ANTICOAGULATION CLINIC. (Patient taking differently: Take 1-2 mg by mouth See admin instructions. Take  tablet (1mg ) by mouth every Sunday, Tuesday and Thursday evening and take 1 tablet (2mg ) by mouth every Monday, Wednesday, Friday and Saturday evening) 130 tablet 1  . fluticasone (FLONASE) 50 MCG/ACT nasal spray Place 2 sprays into both nostrils daily. (Patient not taking: Reported on 06/01/2018) 16 g 6  . metolazone (ZAROXOLYN) 5 MG tablet Take 1 tablet (5 mg) by mouth 30 minutes prior to torsemide dose  as directed (Patient not taking: Reported on 06/01/2018) 10 tablet 0  . potassium chloride SA (K-DUR,KLOR-CON) 20 MEQ tablet Take 1 tablet (20 meq) by mouth once daily (Patient not taking: Reported on 06/01/2018) 30 tablet 0   No current facility-administered medications for this encounter.     Allergies  Allergen Reactions  . Codeine Nausea And Vomiting  . Morphine And Related Nausea And Vomiting  . Sulfonamide Derivatives Other (See Comments)    Dry mouth    Social History   Socioeconomic History  . Marital status: Widowed    Spouse name: Not on file  . Number of children: Not on file  . Years of education: Not on file  . Highest education level: Not on file  Occupational History  . Occupation: Retired    Fish farm manager: RETIRED  Social Needs  . Financial resource strain: Not on file  . Food insecurity:    Worry: Not on file  Inability: Not on file  . Transportation needs:    Medical: Not on file    Non-medical: Not on file  Tobacco Use  . Smoking status: Former Smoker    Packs/day: 0.50    Years: 40.00    Pack years: 20.00    Types: Cigarettes    Last attempt to quit: 05/06/2001    Years since quitting: 17.0  . Smokeless tobacco: Never Used  Substance and Sexual Activity  . Alcohol use: No    Alcohol/week: 0.0 standard drinks  . Drug use: No  . Sexual activity: Never  Lifestyle  . Physical activity:    Days per week: Not on file    Minutes per session: Not on file  . Stress: Not on file  Relationships  . Social connections:    Talks on phone: Not on file    Gets together: Not on file    Attends religious service: Not on file    Active member of club or organization: Not on file    Attends meetings of clubs or organizations: Not on file    Relationship status: Not on file  . Intimate partner violence:    Fear of current or ex partner: Not on file    Emotionally abused: Not on file    Physically abused: Not on file    Forced sexual activity: Not on file    Other Topics Concern  . 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   ALCOHOL USE-NO   RETIRED   FORMER TOBACCO USE.....1/2 PACK X 40 YRS UNTIL 2003   Would desire CPR    Family History  Problem Relation Age of Onset  . Heart failure Mother   . Breast cancer Maternal Aunt 82  . Breast cancer Cousin   . Breast cancer Other   . Colon cancer Neg Hx     ROS- All systems are reviewed and negative except as per the HPI above  Physical Exam: Vitals:   06/01/18 1141  BP: (!) 140/54  Pulse: 62  Weight: 82.6 kg  Height: 5' 6.5" (1.689 m)   Wt Readings from Last 3 Encounters:  06/01/18 82.6 kg  05/22/18 85.3 kg  05/19/18 84.8 kg    Labs: Lab Results  Component Value Date   NA 143 05/19/2018   K 4.4 05/19/2018   CL 99 05/19/2018   CO2 29 05/19/2018   GLUCOSE 100 (H) 05/19/2018   BUN 15 05/19/2018   CREATININE 1.43 (H) 05/19/2018   CALCIUM 9.2 05/19/2018   PHOS 3.8 09/18/2017   MG 2.0 06/04/2010   Lab Results  Component Value Date   INR 2.3 06/01/2018   Lab Results  Component Value Date   CHOL 179 03/19/2016   HDL 39.10 03/19/2016   LDLCALC 118 (H) 03/19/2016   TRIG 110.0 03/19/2016     GEN- The patient is well appearing, alert and oriented x 3 today.   Head- normocephalic, atraumatic Eyes-  Sclera clear, conjunctiva pink Ears- hearing intact Oropharynx- clear Neck- supple, no JVP Lymph- no cervical lymphadenopathy Lungs- Clear to ausculation bilaterally, normal work of breathing Heart- Regular rate and rhythm, no murmurs, rubs or gallops, PMI not laterally displaced GI- soft, NT, ND, + BS Extremities- no clubbing, cyanosis, or edema MS- no significant deformity or atrophy Skin- no rash  or lesion Psych- euthymic mood, full affect Neuro- strength and sensation are intact  EKG-v paced at 62  bpm's, qrs int 156 ms, qtc 537 ms    Assessment and Plan: 1.Persisistent afib Intolerant of briefly used amiodarone (stopped 03/27/18) Aware of price of drug No benadryl use Did use metolazone last week and informed pt that this will be contraindicated with dofetilide, hopefully fluid retention issues will be improved when in rhythm  Bmet/mag today  K+  Is low at 3.5 but Dr. Caryl Comes wants to go ahead and bring her in as it is hard for her to find transportation   Addendum-1400- It does not seem that pt will get a bed before dinner time so she will be going home for a few hours. Have asked her to take 40 meq K+ on arrival home. She will  need to have k+ checked on admission.

## 2018-06-01 NOTE — Progress Notes (Signed)
Pharmacy Consult for [place] Electrolyte Replacement  Pharmacy consulted to assist in monitoring and replacing electrolytes in this 85 y.o. female admitted on 06/01/2018 undergoing dofetilide initiation. First dofetilide dose: planned 1/27 PM  Labs:    Component Value Date/Time   K 3.5 06/01/2018 1138   MG 2.3 06/01/2018 1138     Plan: Potassium: K 3.5-3.7: Patient instructed to take potassium 40 mEq in clinic today, will give an additional 20 mEq and recheck BMET prior to initiation, and repeat as appropriate if K < 4  Magnesium: Mg >2: Appropriate to initiate Tikosyn    Thank you for allowing pharmacy to participate in this patient's care   Claiborne Billings, PharmD PGY2 Cardiology Pharmacy Resident Please check AMION for all Pharmacist numbers by unit 06/01/2018 4:58 PM

## 2018-06-01 NOTE — Progress Notes (Signed)
2005: I spoke with pharmacist in regards to verifying Tikosyn order.   2016: I send message to pharmacy in regards to verifying Tikosyn order.

## 2018-06-01 NOTE — Progress Notes (Signed)
ANTICOAGULATION CONSULT NOTE - Initial Consult  Pharmacy Consult for warfarin Indication: atrial fibrillation  Allergies  Allergen Reactions  . Codeine Nausea And Vomiting  . Morphine And Related Nausea And Vomiting  . Sulfonamide Derivatives Other (See Comments)    Dry mouth    Patient Measurements:   Heparin Dosing Weight: n/a   Vital Signs: Temp: 98.1 F (36.7 C) (01/27 1759) Temp Source: Oral (01/27 1759) BP: 138/91 (01/27 1759) Pulse Rate: 62 (01/27 1759)  Labs: Recent Labs    06/01/18 06/01/18 1138  INR 2.3  --   CREATININE  --  1.52*    Estimated Creatinine Clearance: 31.2 mL/min (A) (by C-G formula based on SCr of 1.52 mg/dL (H)).   Medical History: Past Medical History:  Diagnosis Date  . Adjustment disorder with anxiety   . Anginal pain ([place])   . Arthritis    "some in my hands" (11/11/2012)  . Atrial fibrillation ([place])   . Automatic implantable cardioverter-defibrillator in situ   . Blood in stool   . Carotid artery stenosis 09/2007   60-79% bilateral (stable)  . Carotid artery stenosis 10/2008   40-59% R 60-79%   . Chest pain, unspecified   . Chronic airway obstruction, not elsewhere classified   . Chronic bronchitis ([place])    "get it some; not q year" (11/11/2012)  . Chronic diastolic CHF (congestive heart failure) ([place]) 06/04/2013  . Chronic kidney disease, unspecified   . Coronary artery disease    non-obstructive by 2006 cath  . Dysuria   . Edema   . Encounter for therapeutic drug monitoring   . Exertional shortness of breath   . Frequent UTI    "get them a couple times/yr" (11/11/2012)  . Hemorrhage of rectum and anus   . High cholesterol   . History of blood transfusion 04/2011   "after hip OR" (11/11/2012)  . Hypertension 05/20/2011  . Hypertr obst cardiomyop   . Hypotension, unspecified    cardiac cath 2006..nonobstructive CAD 30-40s lesions.Marland KitchenETT 1/09 nondiagnostic due to poor HR response..Right Renal Cancer 2003  . Long term (current) use  of anticoagulants   . Malignant neoplasm of kidney, except pelvis   . Osteoarthritis of right hip   . Other acute sinusitis   . Other and unspecified coagulation defects   . Other malaise and fatigue   . Other screening mammogram   . PONV (postoperative nausea and vomiting)   . Pre-operative cardiovascular examination   . Renal cancer ([place]) 06/2001   Right  . Secondary cardiomyopathy, unspecified   . Sinus bradycardia   . Special screening for osteoporosis   . Urge incontinence     Medications:  Scheduled:  . [START ON 06/02/2018] carvedilol  6.25 mg Oral q morning - 10a  . dofetilide  125 mcg Oral BID  . [START ON 06/02/2018] ezetimibe  10 mg Oral Daily  . [START ON 06/02/2018] potassium chloride SA  20 mEq Oral Daily  . potassium chloride  20 mEq Oral Once  . [START ON 06/02/2018] rosuvastatin  40 mg Oral Daily  . sodium chloride flush  3 mL Intravenous Q12H  . [START ON 06/02/2018] spironolactone  25 mg Oral Daily  . [START ON 06/02/2018] torsemide  20 mg Oral Daily  . warfarin  1-2 mg Oral See admin instructions    Assessment: 85 yo female admitted for tikosyn initiation.  INR therapeutic as outpatient this AM.    PTA warfarin dose 2 mg daily except 1 mg on Su, Tue, Thurs.  Goal of Therapy:  INR 2-3 Monitor platelets by anticoagulation protocol: Yes   Plan:  1. Coumadin 2 mg x 1 tonight (consistent with home dose) 2. Daily PT/INR.  Marguerite Olea, [facility] Clinical Pharmacist Phone [phone]  06/01/2018 6:03 PM

## 2018-06-01 NOTE — Progress Notes (Signed)
I spoke with Cardiologist in regards to QTc. Cardiologist on call said to give Tikosyn now.

## 2018-06-01 NOTE — Progress Notes (Signed)
Pharmacy Review for Dofetilide (Tikosyn) Initiation  Admit Complaint: 85 y.o. female admitted 06/01/2018 with atrial fibrillation to be initiated on dofetilide.   Assessment:  Patient Exclusion Criteria: If any screening criteria checked as "Yes", then  patient  should NOT receive dofetilide until criteria item is corrected. If "Yes" please indicate correction plan.  YES  NO Patient  Exclusion Criteria Correction Plan  [x]  []  Baseline QTc interval is greater than or equal to 440 msec. IF above YES box checked dofetilide contraindicated unless patient has ICD; then may proceed if QTc 500-550 msec or with known ventricular conduction abnormalities may proceed with QTc 550-600 msec. QTc =537 this afternoon   RN rechecking EKG  []  [x]  Magnesium level is less than 1.8 mEq/l : Last magnesium:  Lab Results  Component Value Date   MG 2.3 06/01/2018         []  [x]  Potassium level is less than 4 mEq/l : Last potassium:  Lab Results  Component Value Date   K 4.4 06/01/2018         []  [x]  Patient is known or suspected to have a digoxin level greater than 2 ng/ml: No results found for: DIGOXIN    []  [x]  Creatinine clearance less than 20 ml/min (calculated using Cockcroft-Gault, actual body weight and serum creatinine): Estimated Creatinine Clearance: 31.3 mL/min (A) (by C-G formula based on SCr of 1.51 mg/dL (H)).    []  [x]  Patient has received drugs known to prolong the QT intervals within the last 48 hours (phenothiazines, tricyclics or tetracyclic antidepressants, erythromycin, H-1 antihistamines, cisapride, fluoroquinolones, azithromycin). Drugs not listed above may have an, as yet, undetected potential to prolong the QT interval, updated information on QT prolonging agents is available at this website:QT prolonging agents   []  [x]  Patient received a dose of hydrochlorothiazide (Oretic) alone or in any combination including triamterene (Dyazide, Maxzide) in the last 48 hours.   []  [x]   Patient received a medication known to increase dofetilide plasma concentrations prior to initial dofetilide dose:  . Trimethoprim (Primsol, Proloprim) in the last 36 hours . Verapamil (Calan, Verelan) in the last 36 hours or a sustained release dose in the last 72 hours . Megestrol (Megace) in the last 5 days  . Cimetidine (Tagamet) in the last 6 hours . Ketoconazole (Nizoral) in the last 24 hours . Itraconazole (Sporanox) in the last 48 hours  . Prochlorperazine (Compazine) in the last 36 hours    []  [x]  Patient is known to have a history of torsades de pointes; congenital or acquired long QT syndromes.   []  [x]  Patient has received a Class 1 antiarrhythmic with less than 2 half-lives since last dose. (Disopyramide, Quinidine, Procainamide, Lidocaine, Mexiletine, Flecainide, Propafenone)   []  [x]  Patient has received amiodarone therapy in the past 3 months or amiodarone level is greater than 0.3 ng/ml.    Patient has been appropriately anticoagulated with warfarin.  Ordering provider was confirmed at [email or web address] if they are not listed on the [place] Prescribers list.  Goal of Therapy: Follow renal function, electrolytes, potential drug interactions, and dose adjustment. Provide education and 1 week supply at discharge.  Plan:  [x]   Physician selected initial dose within range recommended for patients level of renal function - will monitor for response.  []   Physician selected initial dose outside of range recommended for patients level of renal function - will discuss if the dose should be altered at this time.   Select One Calculated CrCl  Dose q12h  []  >  60 ml/min 500 mcg  []  40-60 ml/min 250 mcg  [x]  20-40 ml/min 125 mcg   2. Follow up QTc after the first 5 doses, renal function, electrolytes (K & Mg) daily x 3     days, dose adjustment, success of initiation and facilitate 1 week discharge supply as     clinically indicated.  3. Initiate Tikosyn education video  (Call [phone] and ask for Tikosyn Video # [phone]).  4. Place Enrollment Form on the chart for discharge supply of dofetilide.   Marguerite Olea, [facility] Clinical Pharmacist Phone [phone]  06/01/2018 8:33 PM

## 2018-06-01 NOTE — Addendum Note (Signed)
Encounter addended by: Sherran Needs, NP on: 06/01/2018 2:05 PM  Actions taken: Clinical Note Signed

## 2018-06-02 ENCOUNTER — Encounter (HOSPITAL_COMMUNITY): Payer: Self-pay

## 2018-06-02 LAB — BASIC METABOLIC PANEL
Anion gap: 10 (ref 5–15)
BUN: 20 mg/dL (ref 8–23)
CO2: 28 mmol/L (ref 22–32)
Calcium: 9 mg/dL (ref 8.9–10.3)
Chloride: 102 mmol/L (ref 98–111)
Creatinine, Ser: 1.47 mg/dL — ABNORMAL HIGH (ref 0.44–1.00)
GFR calc Af Amer: 38 mL/min — ABNORMAL LOW (ref >60)
GFR calc non Af Amer: 33 mL/min — ABNORMAL LOW (ref >60)
Glucose, Bld: 108 mg/dL — ABNORMAL HIGH (ref 70–99)
Potassium: 4.2 mmol/L (ref 3.5–5.1)
Sodium: 140 mmol/L (ref 135–145)

## 2018-06-02 LAB — PROTIME-INR
INR: 2.13
Prothrombin Time: 23.6 seconds — ABNORMAL HIGH (ref 11.4–15.2)

## 2018-06-02 LAB — MAGNESIUM: Magnesium: 2.1 mg/dL (ref 1.7–2.4)

## 2018-06-02 LAB — TSH: TSH: 6.414 u[IU]/mL — ABNORMAL HIGH (ref 0.350–4.500)

## 2018-06-02 MED ORDER — HYDROCORTISONE 1 % EX CREA
1.0000 "application " | TOPICAL_CREAM | Freq: Three times a day (TID) | CUTANEOUS | Status: DC | PRN
  Filled 2018-06-02: qty 28

## 2018-06-02 MED ORDER — WARFARIN SODIUM 2 MG PO TABS: 2.0000 mg | ORAL_TABLET | ORAL | Status: DC

## 2018-06-02 MED ORDER — WARFARIN SODIUM 1 MG PO TABS
1.0000 mg | ORAL_TABLET | ORAL | Status: DC
  Administered 2018-06-02: 1 mg via ORAL
  Filled 2018-06-02: qty 1

## 2018-06-02 MED ORDER — SODIUM CHLORIDE 0.9% FLUSH
3.0000 mL | Freq: Two times a day (BID) | INTRAVENOUS | Status: DC
  Administered 2018-06-02 – 2018-06-04 (×5): 3 mL via INTRAVENOUS

## 2018-06-02 MED ORDER — SODIUM CHLORIDE 0.9 % IV SOLN: 250.0000 mL | INTRAVENOUS | Status: DC

## 2018-06-02 MED ORDER — SODIUM CHLORIDE 0.9% FLUSH: 3.0000 mL | INTRAVENOUS | Status: DC | PRN

## 2018-06-02 MED ORDER — FUROSEMIDE 10 MG/ML IJ SOLN
40.0000 mg | Freq: Every day | INTRAMUSCULAR | Status: DC
  Administered 2018-06-02: 40 mg via INTRAVENOUS
  Filled 2018-06-02: qty 4

## 2018-06-02 NOTE — Progress Notes (Signed)
Post dose EKG is reviewed, QTc is OK given paced QRS duration.  [place] for [employer], PA-C

## 2018-06-02 NOTE — Progress Notes (Signed)
EPIC Encounter for ICM Monitoring  Patient Name: [name] is a 85 y.o. female Date: 06/02/2018 Primary Care Physican: Elby Beck, [place] Primary Cardiologist: Rockey Situ Electrophysiologist: Caryl Comes LastWeight:189lbs(183-185 lbs) Today's Weight: unknown  Clinical Status (19-May-2018 to 01-Jun-2018)  Treated VT/VF 0 episodes  AT/AF 1 episode   Time in AT/AF 24.0 hr/day (100.0%)   Longest AT/AF 52 days Observations (2) (19-May-2018 to 01-Jun-2018)  AT/AF >= 6 hr for 13 days.  Patient Activity less than 1 hr/day for 1 weeks.  Transmission reviewed. Patient currently hospitalized for cardioversion and Tikosyn loading.  Report: Thoracic impedance abnormal.   Prescribed: Furosemide 24m take 1 tablet daily  Labs: 06/02/2018 Creatinine 1.47, BUN 20, Potassium 4.2, Sodium 140, eGFR 33-38 06/01/2018 Creatinine 1.51, BUN 20, Potassium 4.4, Sodium 138, eGFR 32-37  05/19/2018 Creatinine 1.43, BUN 15, Potassium 4.4, Sodium 143, eGFR 34-39  05/12/2018 Creatinine 1.50, BUN 20, Potassium 4.3, Sodium 138, eGFR 32-37  04/06/2018 Creatinine1.68, BUN28, Potassium3.5, Sodium139, [id number]/04/2018 Creatinine1.59, BUN24, Potassium4.4, [id number] eGFR30-35  02/24/2018 Creatinine1.81, BUN34, Potassium3.1, Sodium139, eGFR25-29  10/18/2019Creatinine 2.08, BUN36, Potassium2.8, Sodium138, [id number] 10/15/2019Creatinine 1.57, BUN23, Potassium3.4, Sodium138, eGFR30-35 A complete set of results can be found in Results Review.  Recommendations: None  Follow-up plan: ICM clinic phone appointment on 06/22/2018.    Copy of ICM check sent to Dr. KCaryl Comes   3 month ICM trend: 06/01/2018    1 Year ICM trend:       LRosalene Billings RN 06/02/2018 5:48 PM

## 2018-06-02 NOTE — Care Management (Signed)
#    2. S/W  SELEYA  @ [facility]  RX #  [phone] OPT- 2    1. TIKOSYN   125 MCG  BID [place]  #  [phone]   2.  DOFETILIDE  125  MCG  BID COVER- YES CO-PAY- $ 90.00 TIER-  4 DRUG PRIOR APPROVAL- NO  NO DEDUCTIBLE  WITH PLAN  PREFERRED PHARMACY : YES [facility]  AND [facility]

## 2018-06-02 NOTE — Progress Notes (Signed)
[place] for warfarin Indication: atrial fibrillation  Allergies  Allergen Reactions  . Codeine Nausea And Vomiting  . Morphine And Related Nausea And Vomiting  . Sulfonamide Derivatives Other (See Comments)    Dry mouth    Patient Measurements: Height: 5' 6.5" (168.9 cm) Weight: 181 lb 3.2 oz (82.2 kg) IBW/kg (Calculated) : 60.45 Heparin Dosing Weight: n/a   Vital Signs: Temp: 97.8 F (36.6 C) (01/28 0719) Temp Source: Oral (01/28 0719) BP: 165/65 (01/28 0719) Pulse Rate: 70 (01/28 0719)  Labs: Recent Labs    06/01/18 06/01/18 1138 06/01/18 1911 06/02/18 0342  LABPROT  --   --   --  23.6*  INR 2.3  --   --  2.13  CREATININE  --  1.52* 1.51* 1.47*    Estimated Creatinine Clearance: 32.2 mL/min (A) (by C-G formula based on SCr of 1.47 mg/dL (H)).   Medical History: Past Medical History:  Diagnosis Date  . Adjustment disorder with anxiety   . Anginal pain ([place])   . Arthritis    "some in my hands" (11/11/2012)  . Atrial fibrillation ([place])   . Automatic implantable cardioverter-defibrillator in situ   . Blood in stool   . Carotid artery stenosis 09/2007   60-79% bilateral (stable)  . Carotid artery stenosis 10/2008   40-59% R 60-79%   . Chest pain, unspecified   . Chronic airway obstruction, not elsewhere classified   . Chronic bronchitis ([place])    "get it some; not q year" (11/11/2012)  . Chronic diastolic CHF (congestive heart failure) ([place]) 06/04/2013  . Chronic kidney disease, unspecified   . Coronary artery disease    non-obstructive by 2006 cath  . Dysuria   . Edema   . Encounter for therapeutic drug monitoring   . Exertional shortness of breath   . Frequent UTI    "get them a couple times/yr" (11/11/2012)  . Hemorrhage of rectum and anus   . High cholesterol   . History of blood transfusion 04/2011   "after hip OR" (11/11/2012)  . Hypertension 05/20/2011  . Hypertr obst cardiomyop   . Hypotension, unspecified    cardiac cath 2006..nonobstructive CAD 30-40s lesions.Marland KitchenETT 1/09 nondiagnostic due to poor HR response..Right Renal Cancer 2003  . Long term (current) use of anticoagulants   . Malignant neoplasm of kidney, except pelvis   . Osteoarthritis of right hip   . Other acute sinusitis   . Other and unspecified coagulation defects   . Other malaise and fatigue   . Other screening mammogram   . PONV (postoperative nausea and vomiting)   . Pre-operative cardiovascular examination   . Renal cancer ([place]) 06/2001   Right  . Secondary cardiomyopathy, unspecified   . Sinus bradycardia   . Special screening for osteoporosis   . Urge incontinence     Medications:  Scheduled:  . carvedilol  6.25 mg Oral q morning - 10a  . dofetilide  125 mcg Oral BID  . ezetimibe  10 mg Oral Daily  . potassium chloride SA  20 mEq Oral Daily  . rosuvastatin  40 mg Oral Daily  . sodium chloride flush  3 mL Intravenous Q12H  . spironolactone  25 mg Oral Daily  . torsemide  20 mg Oral Daily  . Warfarin - Pharmacist Dosing Inpatient   Does not apply q1800    Assessment: 85 yo female admitted for tikosyn initiation.  -INR at goal   PTA warfarin dose 2 mg daily except 1  mg on Su, Tue, Thurs.  Goal of Therapy:  INR 2-3 Monitor platelets by anticoagulation protocol: Yes   Plan:  Warfarin  2 mg daily except 1 mg on Su, Tue, Thurs Daily PT/INR.  Hildred Laser, PharmD Clinical Pharmacist **Pharmacist phone directory can now be found on [email or web address] ([email or web address]).  Listed under [place].

## 2018-06-02 NOTE — Progress Notes (Signed)
Pharmacy Consult for [place] Electrolyte Replacement  Pharmacy consulted to assist in monitoring and replacing electrolytes in this 85 y.o. female admitted on 06/01/2018 undergoing dofetilide initiation. First dofetilide dose: 06/01/2018 PM  Labs:    Component Value Date/Time   K 4.2 06/02/2018 0342   MG 2.1 06/02/2018 0342     Plan: Potassium: K >/= 4: No additional supplementation needed, continue with home dose of 20 mEq daily  Magnesium: Mg >2: No additional supplementation needed   Thank you for allowing pharmacy to participate in this patient's care   Wyatt Haste 06/02/2018  7:11 AM

## 2018-06-02 NOTE — Progress Notes (Addendum)
Progress Note  Patient Name: [name] Date of Encounter: 06/02/2018  Primary Cardiologist: Virl Axe, MD   Subjective   Transient nausea last PM, none this AM  Inpatient Medications    Scheduled Meds: . carvedilol  6.25 mg Oral q morning - 10a  . dofetilide  125 mcg Oral BID  . ezetimibe  10 mg Oral Daily  . potassium chloride SA  20 mEq Oral Daily  . rosuvastatin  40 mg Oral Daily  . sodium chloride flush  3 mL Intravenous Q12H  . spironolactone  25 mg Oral Daily  . torsemide  20 mg Oral Daily  . Warfarin - Pharmacist Dosing Inpatient   Does not apply q1800   Continuous Infusions: . sodium chloride     PRN Meds: sodium chloride, sodium chloride flush   Vital Signs    Vitals:   06/01/18 1759 06/01/18 2010 06/02/18 0435 06/02/18 0719  BP: (!) 138/91 133/62 (!) 128/49 (!) 165/65  Pulse: 62 66 61 70  Resp: (!) 22 19 17 14   Temp: 98.1 F (36.7 C) 98.4 F (36.9 C) 98.3 F (36.8 C) 97.8 F (36.6 C)  TempSrc: Oral  Oral Oral  SpO2: 99% 99% 95% 94%  Weight: 81.8 kg  82.2 kg   Height: 5' 6.5" (1.689 m)       Intake/Output Summary (Last 24 hours) at 06/02/2018 0919 Last data filed at 06/01/2018 1845 Gross per 24 hour  Intake 222 ml  Output -  Net 222 ml   Last 3 Weights 06/02/2018 06/01/2018 06/01/2018  Weight (lbs) 181 lb 3.2 oz 180 lb 6.4 oz 182 lb  Weight (kg) 82.192 kg 81.829 kg 82.555 kg      Telemetry    AFib V pacing - Personally Reviewed  ECG    AFib, V paced - Personally Reviewed  Physical Exam   GEN: No acute distress.   Neck: No JVD Cardiac: RRR (paced), no murmurs, rubs, or gallops.  Respiratory: CTA bl. GI: Soft, nontender, non-distended  MS: 1++ edema; No deformity. Neuro:  Nonfocal  Psych: Normal affect   Labs    Chemistry Recent Labs  Lab 06/01/18 1138 06/01/18 1911 06/02/18 0342  NA 138 138 140  K 3.5 4.4 4.2  CL 99 99 102  CO2 30 29 28   GLUCOSE 102* 115* 108*  BUN 18 20 20   CREATININE 1.52* 1.51* 1.47*    CALCIUM 9.2 9.3 9.0  GFRNONAA 32* 32* 33*  GFRAA 37* 37* 38*  ANIONGAP 9 10 10      HematologyNo results for input(s): WBC, RBC, HGB, HCT, MCV, MCH, MCHC, RDW, PLT in the last 168 hours.  Cardiac EnzymesNo results for input(s): TROPONINI in the last 168 hours. No results for input(s): TROPIPOC in the last 168 hours.   BNPNo results for input(s): BNP, PROBNP in the last 168 hours.   DDimer No results for input(s): DDIMER in the last 168 hours.   Radiology    No results found.  Cardiac Studies   10/06/2015: TTE Study Conclusions - Left ventricle: The cavity size was normal. Systolic function was   normal. The estimated ejection fraction was in the range of 60%   to 65%. Wall motion was normal; there were no regional wall   motion abnormalities. The study is not technically sufficient to   allow evaluation of LV diastolic function. - Aortic valve: There was trivial regurgitation. - Mitral valve: There was mild to moderate regurgitation. - Left atrium: The atrium was mildly dilated. -  Right ventricle: Pacer wire or catheter noted in right ventricle. - Pulmonary arteries: Systolic pressure was severely elevated. PA   peak pressure: 70 mm Hg (S). - Inferior vena cava: The vessel was dilated. The respirophasic   diameter changes were blunted (< 50%), consistent with elevated   central venous pressure.  Patient Profile     85 y.o. female w/PMHx of NICM w/ICD, CHB, chronic CHF, (diastolic w/improved LVEF), COPD, HTN, HLD, CKD (III), non-obstructive CAD and carotid dz,  And persistent AFib, admitted for Tikosyn initation  Device information MDT dual chamber ICD, RA lead is from 2006, RV lead 2011, most recent gen change 2017  Assessment & Plan    1. Persistent AFib     CHA2DS2Vasc is 6, on warfarin     K+ 4.2     Mag 2.1     Creat 1.47 (Calc CrCl is 38), on 111mcg dosing     QTc given paced QRS and ICD is OK  2. Chronic CHF (diastolic)     IV lasix today, continue  aldactone  3. HTN     No changes today       For questions or updates, please contact [place] Please consult [email or web address] for contact info under        Signed, Baldwin [place], PA-C  06/02/2018, 9:19 AM    pt seen and examined     QTx within range  Hopefully nausea will not preclude use of dofetilide  Still volume overloaded  Will continue IV diuretic

## 2018-06-03 ENCOUNTER — Encounter (HOSPITAL_COMMUNITY)
Admission: AD | Disposition: A | Discharge status: Home / Self Care | Payer: Self-pay | Source: Ambulatory Visit | Attending: Internal Medicine

## 2018-06-03 DIAGNOSIS — I5033 Acute on chronic diastolic (congestive) heart failure: Secondary | ICD-10-CM

## 2018-06-03 LAB — MAGNESIUM: Magnesium: 2.2 mg/dL (ref 1.7–2.4)

## 2018-06-03 LAB — BASIC METABOLIC PANEL
Anion gap: 10 (ref 5–15)
BUN: 19 mg/dL (ref 8–23)
CO2: 27 mmol/L (ref 22–32)
Calcium: 9.2 mg/dL (ref 8.9–10.3)
Chloride: 100 mmol/L (ref 98–111)
Creatinine, Ser: 1.53 mg/dL — ABNORMAL HIGH (ref 0.44–1.00)
GFR calc Af Amer: 36 mL/min — ABNORMAL LOW (ref >60)
GFR calc non Af Amer: 31 mL/min — ABNORMAL LOW (ref >60)
Glucose, Bld: 113 mg/dL — ABNORMAL HIGH (ref 70–99)
Potassium: 4.2 mmol/L (ref 3.5–5.1)
Sodium: 137 mmol/L (ref 135–145)

## 2018-06-03 LAB — PROTIME-INR
INR: 1.89
Prothrombin Time: 21.5 seconds — ABNORMAL HIGH (ref 11.4–15.2)

## 2018-06-03 SURGERY — CARDIOVERSION
Anesthesia: General

## 2018-06-03 MED ORDER — FUROSEMIDE 10 MG/ML IJ SOLN
80.0000 mg | Freq: Every day | INTRAMUSCULAR | Status: DC
  Administered 2018-06-03 – 2018-06-04 (×2): 80 mg via INTRAVENOUS
  Filled 2018-06-03 (×2): qty 8

## 2018-06-03 MED ORDER — WARFARIN SODIUM 4 MG PO TABS
4.0000 mg | ORAL_TABLET | Freq: Once | ORAL | Status: AC
  Administered 2018-06-03: 4 mg via ORAL
  Filled 2018-06-03: qty 1

## 2018-06-03 NOTE — Discharge Summary (Addendum)
DISCHARGE SUMMARY    Patient ID: [name],  MRN: [id number], DOB/AGE: 09-07-1933 85 y.o.  Admit date: 06/01/2018 Discharge date: 06/04/2018  Primary Care Physician: Elby Beck, FNP  Primary Cardiologist/Electrophysiologist: Dr. Caryl Comes  Primary Discharge Diagnosis:  1.  Persistent atrial fibrillation status post Tikosyn loading this admission      CHA2DS2Vasc is 6, on warfarin, monitored and managed with her PMD  Secondary Discharge Diagnosis:  1. Chronic CHF (diastolic) 2. HTN  Allergies  Allergen Reactions  . Codeine Nausea And Vomiting  . Morphine And Related Nausea And Vomiting  . Sulfonamide Derivatives Other (See Comments)    Dry mouth     Procedures This Admission:  None  Brief HPI: [name] is a 85 y.o. female with a past medical history as noted above.  She is followed out patient for by EP service.  Risks, benefits, and alternatives to Tikosyn were reviewed with the patient who wished to proceed.    Hospital Course:  The patient was admitted and Tikosyn was initiated.  Renal function and electrolytes were followed during the hospitalization.  Her QTc remained stable.  She converted to SR with drug and did not require DCCV.  The patient was monitored until discharge on telemetry which demonstrated AV paced.  She was given IV diuresis while here, her electrolytes remained stable, creat only slightly higher then her baseline.  No changes to her home diuretics. She is taking the torsemide alone at home.  There was some confusion with mention of metolazone and furosemide on home med lists, but confirmed she is not taking either.   On the day of discharge she feels well, was examined by Dr Caryl Comes who considered the patient stable for discharge to home.  Follow-up has been arranged with the AFib clinic in 1 week and with Dr Caryl Comes in 4 weeks.     Physical Exam: Vitals:   06/03/18 2122 06/04/18 0611 06/04/18 0832 06/04/18 1508  BP: (!) 123/54  (!) 106/57 (!) 116/49 139/61  Pulse: (!) 59 68  61  Resp:    19  Temp: 97.7 F (36.5 C) 98.2 F (36.8 C)  98 F (36.7 C)  TempSrc: Oral Oral  Oral  SpO2: 99% 95%  100%  Weight:  82.7 kg    Height:        GEN- The patient is well appearing, alert and oriented x 3 today.   HEENT: normocephalic, atraumatic; sclera clear, conjunctiva pink; hearing intact; oropharynx clear; neck supple, no JVP Lymph- no cervical lymphadenopathy Lungs- CTA b/l, normal work of breathing.  No wheezes, rales, rhonchi Heart- RRR, no murmurs, rubs or gallops, PMI not laterally displaced GI- soft, non-tender, non-distended Extremities- no clubbing, cyanosis, 1-2+ edema, chronic looking skin changes MS- no significant deformity, age appropriate atrophy Skin- warm and dry, no rash or lesion Psych- euthymic mood, full affect Neuro- strength and sensation are intact   Labs:   Lab Results  Component Value Date   WBC 3.9 (L) 05/12/2018   HGB 8.9 (L) 05/12/2018   HCT 32.2 (L) 05/12/2018   MCV 71.1 (L) 05/12/2018   PLT 255 05/12/2018    Recent Labs  Lab 06/04/18 0406  NA 139  K 4.1  CL 103  CO2 29  BUN 21  CREATININE 1.61*  CALCIUM 8.7*  GLUCOSE 102*     Discharge Medications:  Allergies as of 06/04/2018      Reactions   Codeine Nausea And Vomiting   Morphine  And Related Nausea And Vomiting   Sulfonamide Derivatives Other (See Comments)   Dry mouth      Medication List    STOP taking these medications   furosemide 40 MG tablet Commonly known as:  LASIX     TAKE these medications   acetaminophen 500 MG tablet Commonly known as:  TYLENOL Take 1,000 mg by mouth daily as needed for headache.   carvedilol 12.5 MG tablet Commonly known as:  COREG Take 1/2 tablet (6.25 mg) once daily in the morning   cetirizine 10 MG tablet Commonly known as:  ZYRTEC Take 10 mg by mouth daily.   CRANBERRY PO Take 4 tablets by mouth daily.   dofetilide 125 MCG capsule Commonly known as:   TIKOSYN Take 1 capsule (125 mcg total) by mouth 2 (two) times daily.   ezetimibe 10 MG tablet Commonly known as:  ZETIA Take 1 tablet (10 mg total) by mouth daily.   fluticasone 50 MCG/ACT nasal spray Commonly known as:  FLONASE Place 2 sprays into both nostrils daily. What changed:    when to take this  reasons to take this   hydrocortisone 2.5 % cream Apply 1 application topically daily as needed (itching).   potassium chloride SA 20 MEQ tablet Commonly known as:  K-DUR,KLOR-CON Take 1 tablet (20 meq) by mouth once daily   rosuvastatin 40 MG tablet Commonly known as:  CRESTOR Take 1 tablet (40 mg total) by mouth daily.   spironolactone 25 MG tablet Commonly known as:  ALDACTONE Take 1 tablet (25 mg total) by mouth daily.   torsemide 20 MG tablet Commonly known as:  DEMADEX Take 2 tablets (40 mg) by mouth once daily   warfarin 2 MG tablet Commonly known as:  COUMADIN Take as directed. If you are unsure how to take this medication, talk to your nurse or doctor. Original instructions:  Take 1 1/2 tablets daily or AS DIRECTED BY ANTICOAGULATION CLINIC. What changed:    how much to take  how to take this  when to take this  additional instructions       Disposition: Home  Discharge Instructions    Diet - low sodium heart healthy   Complete by:  As directed    Increase activity slowly   Complete by:  As directed      Follow-up Information    [facility] Follow up.   Specialty:  Cardiology Why:  06/11/2018 @ 11:00AM Contact information: [address] [id number] [place]       Deboraha Sprang, MD Follow up.   Specialty:  Cardiology Why:  07/06/2018 @ 11:30AM Contact information: [address] [place] [address] [phone]        Elby Beck, FNP Follow up.   Specialties:  Nurse Practitioner, Family Medicine Why:  Keep your regularly  scheduled warfarin check/lab on Tuesday 06/09/2018 Contact information: [place] [address] [phone]           Duration of Discharge Encounter: Greater than 30 minutes including physician time.  Signed, Tommye Standard, PA-C 06/04/2018 5:59 PM  Admitted for dofetilide loading for persistent AF assoc with A/C HFpEF Converted on drug Aggressive IV diuresis Ready for discharge and will arrange followup in the afib clinic

## 2018-06-03 NOTE — Plan of Care (Signed)
  Problem: Education: Goal: Knowledge of General Education information will improve Description Including pain rating scale, medication(s)/side effects and non-pharmacologic comfort measures Outcome: Progressing   Problem: Education: Goal: Knowledge of disease or condition will improve Outcome: Progressing Goal: Understanding of medication regimen will improve Outcome: Progressing   Problem: Activity: Goal: Ability to tolerate increased activity will improve Outcome: Progressing   Problem: Cardiac: Goal: Ability to achieve and maintain adequate cardiopulmonary perfusion will improve Outcome: Progressing

## 2018-06-03 NOTE — Progress Notes (Signed)
Progress Note  Patient Name: [name] Date of Encounter: 06/03/2018  Primary Cardiologist: Virl Axe, MD   Subjective   No nausea  Almost in tears as she is in sinus Still edematous  Inpatient Medications    Scheduled Meds: . carvedilol  6.25 mg Oral q morning - 10a  . dofetilide  125 mcg Oral BID  . ezetimibe  10 mg Oral Daily  . furosemide  40 mg Intravenous Daily  . potassium chloride SA  20 mEq Oral Daily  . rosuvastatin  40 mg Oral Daily  . sodium chloride flush  3 mL Intravenous Q12H  . sodium chloride flush  3 mL Intravenous Q12H  . spironolactone  25 mg Oral Daily  . warfarin  1 mg Oral Once per day on Sun Tue Thu  . warfarin  2 mg Oral Once per day on Mon Wed Fri Sat  . Warfarin - Pharmacist Dosing Inpatient   Does not apply q1800   Continuous Infusions: . sodium chloride    . sodium chloride     PRN Meds: sodium chloride, hydrocortisone cream, sodium chloride flush, sodium chloride flush   Vital Signs    Vitals:   06/02/18 1101 06/02/18 1537 06/02/18 2007 06/03/18 0445  BP: (!) 120/58 (!) 137/56 136/61 (!) 141/58  Pulse: 61 67 60 62  Resp: 14 16    Temp: 97.9 F (36.6 C) 97.7 F (36.5 C) 97.6 F (36.4 C) 97.8 F (36.6 C)  TempSrc: Oral Oral Oral Oral  SpO2: 95% 95% 96% 95%  Weight:    82.2 kg  Height:        Intake/Output Summary (Last 24 hours) at 06/03/2018 0806 Last data filed at 06/02/2018 1022 Gross per 24 hour  Intake 363 ml  Output -  Net 363 ml   Last 3 Weights 06/03/2018 06/02/2018 06/01/2018  Weight (lbs) 181 lb 4.6 oz 181 lb 3.2 oz 180 lb 6.4 oz  Weight (kg) 82.23 kg 82.192 kg 81.829 kg      Telemetry    AV pacing Personally reviewed   ECG    Afib at 2300 with QTc < 500   Physical Exam  Well developed and nourished in no acute distress HENT normal Neck supple   Clear Regular rate and rhythm, no murmurs or gallops Abd-soft with active BS No Clubbing cyanosis 2+edema Skin-warm and dry A & Oriented  Grossly  normal sensory and motor function   Labs    Chemistry Recent Labs  Lab 06/01/18 1138 06/01/18 1911 06/02/18 0342  NA 138 138 140  K 3.5 4.4 4.2  CL 99 99 102  CO2 30 29 28   GLUCOSE 102* 115* 108*  BUN 18 20 20   CREATININE 1.52* 1.51* 1.47*  CALCIUM 9.2 9.3 9.0  GFRNONAA 32* 32* 33*  GFRAA 37* 37* 38*  ANIONGAP 9 10 10      HematologyNo results for input(s): WBC, RBC, HGB, HCT, MCV, MCH, MCHC, RDW, PLT in the last 168 hours.  Cardiac EnzymesNo results for input(s): TROPONINI in the last 168 hours. No results for input(s): TROPIPOC in the last 168 hours.   BNPNo results for input(s): BNP, PROBNP in the last 168 hours.   DDimer No results for input(s): DDIMER in the last 168 hours.   Radiology    No results found.  Cardiac Studies   10/06/2015: TTE Study Conclusions - Left ventricle: The cavity size was normal. Systolic function was   normal. The estimated ejection fraction was in the range  of 60%   to 65%. Wall motion was normal; there were no regional wall   motion abnormalities. The study is not technically sufficient to   allow evaluation of LV diastolic function. - Aortic valve: There was trivial regurgitation. - Mitral valve: There was mild to moderate regurgitation. - Left atrium: The atrium was mildly dilated. - Right ventricle: Pacer wire or catheter noted in right ventricle. - Pulmonary arteries: Systolic pressure was severely elevated. PA   peak pressure: 70 mm Hg (S). - Inferior vena cava: The vessel was dilated. The respirophasic   diameter changes were blunted (< 50%), consistent with elevated   central venous pressure.  Patient Profile     85 y.o. female w/PMHx of NICM w/ICD, CHB, chronic CHF, (diastolic w/improved LVEF), COPD, HTN, HLD, CKD (III), non-obstructive CAD and carotid dz,  And persistent AFib, admitted for Tikosyn initation  Device information MDT dual chamber ICD, RA lead is from 2006, RV lead 2011, most recent gen change  2017  Assessment & Plan    Atrial fibrillation/flutter persistent  Nonischemic cardiomyopathy with interval normalization of LV function  Sinus node dysfunction device dependent   Mitral regurgitation  Complete heart block intermittent  Congestive heart failure chronic/diastolic class IIb-IIIa  Chronic Renal insufficiency grade 3   Dizziness  Implantable defibrillator    Afib converted overnight  Now AV pacing Will continue IV diuresis overnight   Will need to see am BMP     Signed, Virl Axe, MD  06/03/2018, 8:06 AM

## 2018-06-03 NOTE — Progress Notes (Signed)
[employer] for warfarin Indication: atrial fibrillation  Allergies  Allergen Reactions  . Codeine Nausea And Vomiting  . Morphine And Related Nausea And Vomiting  . Sulfonamide Derivatives Other (See Comments)    Dry mouth    Patient Measurements: Height: 5' 6.5" (168.9 cm) Weight: 181 lb 4.6 oz (82.2 kg) IBW/kg (Calculated) : 60.45 Heparin Dosing Weight: n/a   Vital Signs: Temp: 97.8 F (36.6 C) (01/29 0445) Temp Source: Oral (01/29 0445) BP: 141/58 (01/29 0445) Pulse Rate: 62 (01/29 0445)  Labs: Recent Labs    06/01/18  06/01/18 1911 06/02/18 0342 06/03/18 0733  LABPROT  --   --   --  23.6* 21.5*  INR 2.3  --   --  2.13 1.89  CREATININE  --    < > 1.51* 1.47* 1.53*   < > = values in this interval not displayed.    Estimated Creatinine Clearance: 31 mL/min (A) (by C-G formula based on SCr of 1.53 mg/dL (H)).   Medical History: Past Medical History:  Diagnosis Date  . Adjustment disorder with anxiety   . Anginal pain ([place])   . Arthritis    "some in my hands" (11/11/2012)  . Atrial fibrillation ([place])   . Automatic implantable cardioverter-defibrillator in situ   . Blood in stool   . Carotid artery stenosis 09/2007   60-79% bilateral (stable)  . Carotid artery stenosis 10/2008   40-59% R 60-79%   . Chest pain, unspecified   . Chronic airway obstruction, not elsewhere classified   . Chronic bronchitis ([place])    "get it some; not q year" (11/11/2012)  . Chronic diastolic CHF (congestive heart failure) ([place]) 06/04/2013  . Chronic kidney disease, unspecified   . Coronary artery disease    non-obstructive by 2006 cath  . Dysuria   . Edema   . Encounter for therapeutic drug monitoring   . Exertional shortness of breath   . Frequent UTI    "get them a couple times/yr" (11/11/2012)  . Hemorrhage of rectum and anus   . High cholesterol   . History of blood transfusion 04/2011   "after hip OR" (11/11/2012)  . Hypertension  05/20/2011  . Hypertr obst cardiomyop   . Hypotension, unspecified    cardiac cath 2006..nonobstructive CAD 30-40s lesions.Marland KitchenETT 1/09 nondiagnostic due to poor HR response..Right Renal Cancer 2003  . Long term (current) use of anticoagulants   . Malignant neoplasm of kidney, except pelvis   . Osteoarthritis of right hip   . Other acute sinusitis   . Other and unspecified coagulation defects   . Other malaise and fatigue   . Other screening mammogram   . PONV (postoperative nausea and vomiting)   . Pre-operative cardiovascular examination   . Renal cancer ([place]) 06/2001   Right  . Secondary cardiomyopathy, unspecified   . Sinus bradycardia   . Special screening for osteoporosis   . Urge incontinence     Medications:  Scheduled:  . carvedilol  6.25 mg Oral q morning - 10a  . dofetilide  125 mcg Oral BID  . ezetimibe  10 mg Oral Daily  . furosemide  80 mg Intravenous Daily  . potassium chloride SA  20 mEq Oral Daily  . rosuvastatin  40 mg Oral Daily  . sodium chloride flush  3 mL Intravenous Q12H  . sodium chloride flush  3 mL Intravenous Q12H  . spironolactone  25 mg Oral Daily  . warfarin  1 mg Oral Once per  day on Sun Tue Thu  . warfarin  2 mg Oral Once per day on Mon Wed Fri Sat  . Warfarin - Pharmacist Dosing Inpatient   Does not apply q1800    Assessment: 85 yo female admitted for tikosyn initiation. Per notes she converted to SR -INR = 1.89 (down) PTA warfarin dose 2 mg daily except 1 mg on Su, Tue, Thurs.  Goal of Therapy:  INR 2-3 Monitor platelets by anticoagulation protocol: Yes   Plan:  Warfarin  4mg  po today (trying to avoid further INR drop with Tikosyn initiation)- -Daily PT/INR.  Hildred Laser, PharmD Clinical Pharmacist **Pharmacist phone directory can now be found on [email or web address] ([email or web address]).  Listed under [place].

## 2018-06-04 ENCOUNTER — Ambulatory Visit: Payer: PPO

## 2018-06-04 ENCOUNTER — Other Ambulatory Visit: Payer: PPO

## 2018-06-04 ENCOUNTER — Encounter (HOSPITAL_COMMUNITY): Payer: Self-pay

## 2018-06-04 ENCOUNTER — Other Ambulatory Visit: Payer: Self-pay

## 2018-06-04 LAB — BASIC METABOLIC PANEL
Anion gap: 7 (ref 5–15)
BUN: 21 mg/dL (ref 8–23)
CO2: 29 mmol/L (ref 22–32)
Calcium: 8.7 mg/dL — ABNORMAL LOW (ref 8.9–10.3)
Chloride: 103 mmol/L (ref 98–111)
Creatinine, Ser: 1.61 mg/dL — ABNORMAL HIGH (ref 0.44–1.00)
GFR calc Af Amer: 34 mL/min — ABNORMAL LOW (ref >60)
GFR calc non Af Amer: 29 mL/min — ABNORMAL LOW (ref >60)
Glucose, Bld: 102 mg/dL — ABNORMAL HIGH (ref 70–99)
Potassium: 4.1 mmol/L (ref 3.5–5.1)
Sodium: 139 mmol/L (ref 135–145)

## 2018-06-04 LAB — MAGNESIUM: Magnesium: 2.1 mg/dL (ref 1.7–2.4)

## 2018-06-04 LAB — PROTIME-INR
INR: 1.99
Prothrombin Time: 22.3 seconds — ABNORMAL HIGH (ref 11.4–15.2)

## 2018-06-04 MED ORDER — DOFETILIDE 125 MCG PO CAPS: 125.0000 ug | ORAL_CAPSULE | Freq: Two times a day (BID) | ORAL | 0 refills | Status: DC

## 2018-06-04 MED ORDER — FUROSEMIDE 10 MG/ML IJ SOLN
120.0000 mg | Freq: Once | INTRAVENOUS | Status: DC
  Filled 2018-06-04: qty 12

## 2018-06-04 MED ORDER — POTASSIUM CHLORIDE CRYS ER 20 MEQ PO TBCR
20.0000 meq | EXTENDED_RELEASE_TABLET | Freq: Once | ORAL | Status: AC
  Administered 2018-06-04: 20 meq via ORAL
  Filled 2018-06-04: qty 1

## 2018-06-04 MED ORDER — DOFETILIDE 125 MCG PO CAPS: 125.0000 ug | ORAL_CAPSULE | Freq: Two times a day (BID) | ORAL | 6 refills | Status: DC

## 2018-06-04 MED ORDER — FUROSEMIDE 10 MG/ML IJ SOLN
40.0000 mg | Freq: Once | INTRAMUSCULAR | Status: AC
  Administered 2018-06-04: 40 mg via INTRAVENOUS
  Filled 2018-06-04: qty 4

## 2018-06-04 MED ORDER — WARFARIN SODIUM 2 MG PO TABS
2.0000 mg | ORAL_TABLET | Freq: Once | ORAL | Status: AC
  Administered 2018-06-04: 2 mg via ORAL
  Filled 2018-06-04: qty 1

## 2018-06-04 MED ORDER — METOLAZONE 5 MG PO TABS
2.5000 mg | ORAL_TABLET | Freq: Once | ORAL | Status: AC
  Administered 2018-06-04: 2.5 mg via ORAL
  Filled 2018-06-04: qty 1

## 2018-06-04 MED ORDER — WARFARIN SODIUM 1 MG PO TABS: 1.0000 mg | ORAL_TABLET | Freq: Once | ORAL | Status: DC

## 2018-06-04 MED FILL — TIKOSYN 125 MCG CAPS: 125 | 7 days supply | Qty: 14 | Fill #0

## 2018-06-04 NOTE — Discharge Instructions (Signed)
You have an appointment set up with the [facility].  Multiple studies have shown that being followed by a dedicated atrial fibrillation clinic in addition to the standard care you receive from your other physicians improves health. We believe that enrollment in the atrial fibrillation clinic will allow [place] to better care for you.   The phone number to the [facility] is [phone]. The clinic is staffed Monday through Friday from 8:30am to 5pm.  Parking Directions: The clinic is located in the Heart and Vascular Building connected to [facility]. 1)From [address] turn on to [address] and go to the 3rd entrance  (Heart and Vascular entrance) on the right. 2)Look to the right for Heart &Vascular Parking Garage. 3)A code for the entrance is required please call the clinic to receive this.   4)Take the elevators to the 1st floor. Registration is in the room with the glass walls at the end of the hallway.  If you have any trouble parking or locating the clinic, please dont hesitate to call [phone].    Atrial Fibrillation  Atrial fibrillation is a type of heartbeat that is irregular or fast (rapid). If you have this condition, your heart beats without any order. This makes it hard for your heart to pump blood in a normal way. Having this condition gives you more risk for stroke, heart failure, and other heart problems. Atrial fibrillation may start all of a sudden and then stop on its own, or it may become a long-lasting problem. What are the causes? This condition may be caused by heart conditions, such as:  High blood pressure.  Heart failure.  Heart valve disease.  Heart surgery. Other causes include:  Pneumonia.  Obstructive sleep apnea.  Lung cancer.  Thyroid disease.  Drinking too much alcohol. Sometimes the cause is not known. What increases the risk? You are more likely to develop this condition if:  You  smoke.  You are older.  You have diabetes.  You are overweight.  You have a family history of this condition.  You exercise often and hard. What are the signs or symptoms? Common symptoms of this condition include:  A feeling like your heart is beating very fast.  Chest pain.  Feeling short of breath.  Feeling light-headed or weak.  Getting tired easily. Follow these instructions at home: Medicines  Take over-the-counter and prescription medicines only as told by your doctor.  If your doctor gives you a blood-thinning medicine, take it exactly as told. Taking too much of it can cause bleeding. Taking too little of it does not protect you against clots. Clots can cause a stroke. Lifestyle      Do not use any tobacco products. These include cigarettes, chewing tobacco, and e-cigarettes. If you need help quitting, ask your doctor.  Do not drink alcohol.  Do not drink beverages that have caffeine. These include coffee, soda, and tea.  Follow diet instructions as told by your doctor.  Exercise regularly as told by your doctor. General instructions  If you have a condition that causes breathing to stop for a short period of time (apnea), treat it as told by your doctor.  Keep a healthy weight. Do not use diet pills unless your doctor says they are safe for you. Diet pills may make heart problems worse.  Keep all follow-up visits as told by your doctor. This is important. Contact a doctor if:  You notice a change in the speed, rhythm, or strength  of your heartbeat.  You are taking a blood-thinning medicine and you see more bruising.  You get tired more easily when you move or exercise.  You have a sudden change in weight. Get help right away if:   You have pain in your chest or your belly (abdomen).  You have trouble breathing.  You have blood in your vomit, poop, or pee (urine).  You have any signs of a stroke. "BE FAST" is an easy way to remember the  main warning signs: ? B - Balance. Signs are dizziness, sudden trouble walking, or loss of balance. ? E - Eyes. Signs are trouble seeing or a change in how you see. ? F - Face. Signs are sudden weakness or loss of feeling in the face, or the face or eyelid drooping on one side. ? A - Arms. Signs are weakness or loss of feeling in an arm. This happens suddenly and usually on one side of the body. ? S - Speech. Signs are sudden trouble speaking, slurred speech, or trouble understanding what people say. ? T - Time. Time to call emergency services. Write down what time symptoms started.  You have other signs of a stroke, such as: ? A sudden, very bad headache with no known cause. ? Feeling sick to your stomach (nausea). ? Throwing up (vomiting). ? Jerky movements you cannot control (seizure). These symptoms may be an emergency. Do not wait to see if the symptoms will go away. Get medical help right away. Call your local emergency services (911 in the [place] Do not drive yourself to the hospital. Summary  Atrial fibrillation is a type of heartbeat that is irregular or fast (rapid).  You are at higher risk of this condition if you smoke, are older, have diabetes, or are overweight.  Follow your doctor's instructions about medicines, diet, exercise, and follow-up visits.  Get help right away if you think that you have signs of a stroke. This information is not intended to replace advice given to you by your health care provider. Make sure you discuss any questions you have with your health care provider. Document Released: 01/30/2008 Document Revised: 06/13/2017 Document Reviewed: 06/13/2017 Elsevier Interactive Patient Education  2019 Elsevier Inc.   Dofetilide capsules What is this medicine? DOFETILIDE (doe FET il ide) is an antiarrhythmic drug. It helps make your heart beat regularly. This medicine also helps to slow rapid heartbeats. This medicine may be used for other purposes; ask your  health care provider or pharmacist if you have questions. COMMON BRAND NAME(S): Tikosyn What should I tell my health care provider before I take this medicine? They need to know if you have any of these conditions: -heart disease -history of irregular heartbeat -history of low levels of potassium or magnesium in the blood -kidney disease -liver disease -an unusual or allergic reaction to dofetilide, other medicines, foods, dyes, or preservatives -pregnant or trying to get pregnant -breast-feeding How should I use this medicine? Take this medicine by mouth with a glass of water. Follow the directions on the prescription label. Do not take with grapefruit juice. You can take it with or without food. If it upsets your stomach, take it with food. Take your medicine at regular intervals. Do not take it more often than directed. Do not stop taking except on your doctor's advice. A special MedGuide will be given to you by the pharmacist with each prescription and refill. Be sure to read this information carefully each time. Talk to your pediatrician  regarding the use of this medicine in children. Special care may be needed. Overdosage: If you think you have taken too much of this medicine contact a poison control center or emergency room at once. NOTE: This medicine is only for you. Do not share this medicine with others. What if I miss a dose? If you miss a dose, skip it. Take your next dose at the normal time. Do not take extra or 2 doses at the same time to make up for the missed dose. What may interact with this medicine? Do not take this medicine with any of the following medications: -cimetidine -cisapride -dolutegravir -dronedarone -hydrochlorothiazide -ketoconazole -megestrol -pimozide -prochlorperazine -thioridazine -trimethoprim -verapamil This medicine may also interact with the following medications: -amiloride -cannabinoids -certain antibiotics like erythromycin or  clarithromycin -certain antiviral medicines for HIV or hepatitis -certain medicines for depression, anxiety, or psychotic disorders -digoxin -diltiazem -grapefruit juice -metformin -nefazodone -other medicines that prolong the QT interval (an abnormal heart rhythm) -quinine -triamterene -zafirlukast This list may not describe all possible interactions. Give your health care provider a list of all the medicines, herbs, non-prescription drugs, or dietary supplements you use. Also tell them if you smoke, drink alcohol, or use illegal drugs. Some items may interact with your medicine. What should I watch for while using this medicine? Your condition will be monitored carefully while you are receiving this medicine. What side effects may I notice from receiving this medicine? Side effects that you should report to your doctor or health care professional as soon as possible: -allergic reactions like skin rash, itching or hives, swelling of the face, lips, or tongue -breathing problems -chest pain or chest tightness -dizziness -signs and symptoms of a dangerous change in heartbeat or heart rhythm like chest pain; dizziness; fast or irregular heartbeat; palpitations; feeling faint or lightheaded, falls; breathing problems -signs and symptoms of electrolyte imbalance like severe diarrhea, unusual sweating, vomiting, loss of appetite, increased thirst -swelling of the ankles, legs, or feet -tingling, numbness in the hands or feet Side effects that usually do not require medical attention (report to your doctor or health care professional if they continue or are bothersome): -diarrhea -general ill feeling or flu-like symptoms -headache -nausea -trouble sleeping -stomach pain This list may not describe all possible side effects. Call your doctor for medical advice about side effects. You may report side effects to FDA at 1-800-FDA-1088. Where should I keep my medicine? Keep out of the reach of  children. Store at room temperature between 15 and 30 degrees C (59 and 86 degrees F). Throw away any unused medicine after the expiration date. NOTE: This sheet is a summary. It may not cover all possible information. If you have questions about this medicine, talk to your doctor, pharmacist, or health care provider.  2019 Elsevier/Gold Standard (2017-12-11 11:48:00)

## 2018-06-04 NOTE — Care Management Note (Signed)
Case Management Note  Patient Details  Name: [name] MRN: [id number] Date of Birth: [birth year]  Subjective/Objective:  Pt presented for Tikosyn Load- Persistent Afib/flutter. PTA independent from home. Patient has support of family.                   Action/Plan: Pt will need Rx for 7 day supply no refills and original Rx with refills needs to [facility]. Pharmacy can order and get next day Monday-Friday. No further needs from CM at this time.   Expected Discharge Date:                  Expected Discharge Plan:  Home/Self Care  In-House Referral:  NA  Discharge planning Services  NA  Post Acute Care Choice:  NA Choice offered to:  NA  DME Arranged:  N/A DME Agency:  NA  HH Arranged:  NA HH Agency:  NA  Status of Service:  Completed, signed off  If discussed at [place] of Stay Meetings, dates discussed:    Additional Comments:  Bethena Roys, RN 06/04/2018, 9:42 AM

## 2018-06-04 NOTE — Progress Notes (Signed)
[place] for warfarin Indication: atrial fibrillation  Allergies  Allergen Reactions  . Codeine Nausea And Vomiting  . Morphine And Related Nausea And Vomiting  . Sulfonamide Derivatives Other (See Comments)    Dry mouth    Patient Measurements: Height: 5' 6.5" (168.9 cm) Weight: 182 lb 6.4 oz (82.7 kg) IBW/kg (Calculated) : 60.45 Heparin Dosing Weight: n/a   Vital Signs: Temp: 98.2 F (36.8 C) (01/30 0611) Temp Source: Oral (01/30 0611) BP: 116/49 (01/30 0832) Pulse Rate: 68 (01/30 0611)  Labs: Recent Labs    06/02/18 0342 06/03/18 0733 06/04/18 0406  LABPROT 23.6* 21.5* 22.3*  INR 2.13 1.89 1.99  CREATININE 1.47* 1.53* 1.61*    Estimated Creatinine Clearance: 29.5 mL/min (A) (by C-G formula based on SCr of 1.61 mg/dL (H)).   Medical History: Past Medical History:  Diagnosis Date  . Adjustment disorder with anxiety   . Anginal pain ([place])   . Arthritis    "some in my hands" (11/11/2012)  . Atrial fibrillation ([place])   . Automatic implantable cardioverter-defibrillator in situ   . Blood in stool   . Carotid artery stenosis 09/2007   60-79% bilateral (stable)  . Carotid artery stenosis 10/2008   40-59% R 60-79%   . Chest pain, unspecified   . Chronic airway obstruction, not elsewhere classified   . Chronic bronchitis ([place])    "get it some; not q year" (11/11/2012)  . Chronic diastolic CHF (congestive heart failure) ([place]) 06/04/2013  . Chronic kidney disease, unspecified   . Coronary artery disease    non-obstructive by 2006 cath  . Dysuria   . Edema   . Encounter for therapeutic drug monitoring   . Exertional shortness of breath   . Frequent UTI    "get them a couple times/yr" (11/11/2012)  . Hemorrhage of rectum and anus   . High cholesterol   . History of blood transfusion 04/2011   "after hip OR" (11/11/2012)  . Hypertension 05/20/2011  . Hypertr obst cardiomyop   . Hypotension, unspecified    cardiac cath  2006..nonobstructive CAD 30-40s lesions.Marland KitchenETT 1/09 nondiagnostic due to poor HR response..Right Renal Cancer 2003  . Long term (current) use of anticoagulants   . Malignant neoplasm of kidney, except pelvis   . Osteoarthritis of right hip   . Other acute sinusitis   . Other and unspecified coagulation defects   . Other malaise and fatigue   . Other screening mammogram   . PONV (postoperative nausea and vomiting)   . Pre-operative cardiovascular examination   . Renal cancer ([place]) 06/2001   Right  . Secondary cardiomyopathy, unspecified   . Sinus bradycardia   . Special screening for osteoporosis   . Urge incontinence     Medications:  Scheduled:  . carvedilol  6.25 mg Oral q morning - 10a  . dofetilide  125 mcg Oral BID  . ezetimibe  10 mg Oral Daily  . metolazone  2.5 mg Oral Once  . potassium chloride SA  20 mEq Oral Daily  . potassium chloride  20 mEq Oral Once  . rosuvastatin  40 mg Oral Daily  . sodium chloride flush  3 mL Intravenous Q12H  . sodium chloride flush  3 mL Intravenous Q12H  . spironolactone  25 mg Oral Daily  . Warfarin - Pharmacist Dosing Inpatient   Does not apply q1800    Assessment: 85 yo female admitted for tikosyn initiation. Per notes she converted to SR -INR = 1.99  PTA warfarin dose 2 mg daily except 1 mg on Su, Tue, Thurs.  Goal of Therapy:  INR 2-3 Monitor platelets by anticoagulation protocol: Yes   Plan:  Warfarin  1mg  po today Daily PT/INR. If discharging would continue on home dose  Hildred Laser, PharmD Clinical Pharmacist **Pharmacist phone directory can now be found on [email or web address] ([email or web address]).  Listed under [place].

## 2018-06-04 NOTE — Progress Notes (Signed)
Pharmacy Consult for [place] Electrolyte Replacement  Pharmacy consulted to assist in monitoring and replacing electrolytes in this 85 y.o. female admitted on 06/01/2018 undergoing dofetilide initiation.   Labs:    Component Value Date/Time   K 4.1 06/04/2018 0406   MG 2.1 06/04/2018 0406     Plan: Potassium: K >/= 4: No additional supplementation needed  Magnesium: Mg >2: No additional supplementation needed   As patient has required on average 20 mEq of potassium replacement every day. She was on this dose PTA. Would consider discharging on this dose if she continues on the same dose of torsemide (she has been getting IV lasix here)  Thank you for allowing pharmacy to participate in this patient's care   Hildred Laser, PharmD Clinical Pharmacist **Pharmacist phone directory can now be found on [email or web address] ([email or web address]).  Listed under [place].

## 2018-06-04 NOTE — Progress Notes (Signed)
Progress Note  Patient Name: [name] Date of Encounter: 06/04/2018  Primary Cardiologist: Virl Axe, MD   Subjective   Feels good but still swollen  Not much urination ydesterday No chest pain  Inpatient Medications    Scheduled Meds: . carvedilol  6.25 mg Oral q morning - 10a  . dofetilide  125 mcg Oral BID  . ezetimibe  10 mg Oral Daily  . furosemide  80 mg Intravenous Daily  . potassium chloride SA  20 mEq Oral Daily  . rosuvastatin  40 mg Oral Daily  . sodium chloride flush  3 mL Intravenous Q12H  . sodium chloride flush  3 mL Intravenous Q12H  . spironolactone  25 mg Oral Daily  . Warfarin - Pharmacist Dosing Inpatient   Does not apply q1800   Continuous Infusions: . sodium chloride    . sodium chloride     PRN Meds: sodium chloride, hydrocortisone cream, sodium chloride flush, sodium chloride flush   Vital Signs    Vitals:   06/03/18 0445 06/03/18 1505 06/03/18 2122 06/04/18 0611  BP: (!) 141/58 (!) 123/58 (!) 123/54 (!) 106/57  Pulse: 62 63 (!) 59 68  Resp:  18    Temp: 97.8 F (36.6 C) 98.2 F (36.8 C) 97.7 F (36.5 C) 98.2 F (36.8 C)  TempSrc: Oral Oral Oral Oral  SpO2: 95% 96% 99% 95%  Weight: 82.2 kg   82.7 kg  Height:        Intake/Output Summary (Last 24 hours) at 06/04/2018 0735 Last data filed at 06/03/2018 1242 Gross per 24 hour  Intake 420 ml  Output -  Net 420 ml   Last 3 Weights 06/04/2018 06/03/2018 06/02/2018  Weight (lbs) 182 lb 6.4 oz 181 lb 4.6 oz 181 lb 3.2 oz  Weight (kg) 82.736 kg 82.23 kg 82.192 kg      Telemetry    Personally reviewed  Sinus without VT    ECG    QTc within range with paced complex  Physical Exam  Well developed and nourished in no acute distress HENT normal Neck supple t Clear Regular rate and rhythm, no murmurs or gallops Abd-soft with active BS No Clubbing cyanosis 2+ R>L edema Skin-warm and dry no erythema  A & Oriented  Grossly normal sensory and motor function    Labs      Chemistry Recent Labs  Lab 06/02/18 0342 06/03/18 0733 06/04/18 0406  NA 140 137 139  K 4.2 4.2 4.1  CL 102 100 103  CO2 28 27 29   GLUCOSE 108* 113* 102*  BUN 20 19 21   CREATININE 1.47* 1.53* 1.61*  CALCIUM 9.0 9.2 8.7*  GFRNONAA 33* 31* 29*  GFRAA 38* 36* 34*  ANIONGAP 10 10 7      HematologyNo results for input(s): WBC, RBC, HGB, HCT, MCV, MCH, MCHC, RDW, PLT in the last 168 hours.  Cardiac EnzymesNo results for input(s): TROPONINI in the last 168 hours. No results for input(s): TROPIPOC in the last 168 hours.   BNPNo results for input(s): BNP, PROBNP in the last 168 hours.   DDimer No results for input(s): DDIMER in the last 168 hours.   Radiology    No results found.  Cardiac Studies   10/06/2015: TTE Study Conclusions - Left ventricle: The cavity size was normal. Systolic function was   normal. The estimated ejection fraction was in the range of 60%   to 65%. Wall motion was normal; there were no regional wall   motion abnormalities.  The study is not technically sufficient to   allow evaluation of LV diastolic function. - Aortic valve: There was trivial regurgitation. - Mitral valve: There was mild to moderate regurgitation. - Left atrium: The atrium was mildly dilated. - Right ventricle: Pacer wire or catheter noted in right ventricle. - Pulmonary arteries: Systolic pressure was severely elevated. PA   peak pressure: 70 mm Hg (S). - Inferior vena cava: The vessel was dilated. The respirophasic   diameter changes were blunted (< 50%), consistent with elevated   central venous pressure.  Patient Profile     85 y.o. female w/PMHx of NICM w/ICD, CHB, chronic CHF, (diastolic w/improved LVEF), COPD, HTN, HLD, CKD (III), non-obstructive CAD and carotid dz,  And persistent AFib, admitted for Tikosyn initation  Device information MDT dual chamber ICD, RA lead is from 2006, RV lead 2011, most recent gen change 2017  Assessment & Plan    Atrial  fibrillation/flutter persistent  Nonischemic cardiomyopathy with interval normalization of LV function  Sinus node dysfunction device dependent   Mitral regurgitation  Complete heart block intermittent  Congestive heart failure chronic/diastolic class IIb-IIIa  Chronic Renal insufficiency grade 3   Dizziness  Implantable defibrillator  Cr up a little bit today But still volume overloaded Will give one more dose of iv diuretic ( supplement K ) and anticpate discharge this afternoon  K stable  Discharge to home today  Signed, Virl Axe, MD  06/04/2018, 7:35 AM

## 2018-06-05 ENCOUNTER — Telehealth: Payer: Self-pay | Admitting: Internal Medicine

## 2018-06-05 DIAGNOSIS — R252 Cramp and spasm: Secondary | ICD-10-CM

## 2018-06-05 DIAGNOSIS — Z79899 Other long term (current) drug therapy: Secondary | ICD-10-CM

## 2018-06-05 MED ORDER — MAGNESIUM OXIDE 400 MG PO TABS: 400.0000 mg | ORAL_TABLET | Freq: Every day | ORAL | 2 refills | Status: DC

## 2018-06-05 NOTE — Telephone Encounter (Signed)
[name] calling to check status of Dr. Aquilla Hacker suggestions

## 2018-06-05 NOTE — Telephone Encounter (Signed)
Patient just went home last night from [place] admission and c/o cramping in legs and hands .  Please call to discuss patient is concerned and says she is not sure what to do .  Cramping is extremely painful.

## 2018-06-05 NOTE — Telephone Encounter (Signed)
I reviewed the patient's symptoms with Dr. Caryl Comes. He advised the patient was heavily diuresed prior to discharge yesterday.  Recommendations are:  1) go for a repeat BMP/ Magnesium today, but if the patient is unable to do this, then  2) start magnesium oxide 400 mg once daily and repeat a BMP/ Magnesium on Monday.   I spoke with the patient. She states she is unable to go for lab work today as she is not driving.  She is agreeable with starting magnesium oxide this weekend. I have advised she could increase dietary potassium a bit as well to see if this helps with symptoms. She states that she will go to the [place] [facility] on Monday for a repeat BMP/ Magnesium level.

## 2018-06-06 NOTE — Consult Note (Signed)
            [facility] Primary Care Navigator  06/06/2018  [name] 1934/04/19 [id number]   Attemptto seepatient at the bedside to identify possible discharge needs but she wasalready discharged home.  Per MD note,patient presented with persistent atrial fibrillation status post Tikosyn loading this admission.  Patient has discharge instruction to follow-up withprimary care provider on 06/09/18 (warfarin check/labs); A-fib Clinic follow-up on 06/11/18 and cardiology follow-up on 07/06/18.  Primary care provider's office is listed as providing transition of care (TOC) follow-up.    For additional questions please contact:  Edwena Felty A. Marcques Wrightsman, BSN, RN-BC [facility] PRIMARY CARE Navigator Cell: [phone]

## 2018-06-08 ENCOUNTER — Other Ambulatory Visit
Admission: RE | Admit: 2018-06-08 | Discharge: 2018-06-08 | Disposition: A | Discharge status: Home / Self Care | Payer: PPO | Source: Ambulatory Visit | Attending: Internal Medicine | Admitting: Internal Medicine

## 2018-06-08 ENCOUNTER — Telehealth: Payer: Self-pay

## 2018-06-08 DIAGNOSIS — R252 Cramp and spasm: Secondary | ICD-10-CM | POA: Insufficient documentation

## 2018-06-08 DIAGNOSIS — Z79899 Other long term (current) drug therapy: Secondary | ICD-10-CM | POA: Diagnosis not present

## 2018-06-08 DIAGNOSIS — Z7901 Long term (current) use of anticoagulants: Secondary | ICD-10-CM

## 2018-06-08 DIAGNOSIS — I4819 Other persistent atrial fibrillation: Secondary | ICD-10-CM

## 2018-06-08 LAB — BASIC METABOLIC PANEL
Anion gap: 7 (ref 5–15)
BUN: 21 mg/dL (ref 8–23)
CO2: 33 mmol/L — ABNORMAL HIGH (ref 22–32)
Calcium: 9.2 mg/dL (ref 8.9–10.3)
Chloride: 98 mmol/L (ref 98–111)
Creatinine, Ser: 1.4 mg/dL — ABNORMAL HIGH (ref 0.44–1.00)
GFR calc Af Amer: 40 mL/min — ABNORMAL LOW (ref >60)
GFR calc non Af Amer: 35 mL/min — ABNORMAL LOW (ref >60)
Glucose, Bld: 88 mg/dL (ref 70–99)
Potassium: 4 mmol/L (ref 3.5–5.1)
Sodium: 138 mmol/L (ref 135–145)

## 2018-06-08 LAB — MAGNESIUM: Magnesium: 2.2 mg/dL (ref 1.7–2.4)

## 2018-06-08 NOTE — Telephone Encounter (Signed)
Patient has not gotten repeat labs at this time.  Attempted to reach patient as a reminder.  No answer and no VM set up.

## 2018-06-08 NOTE — Telephone Encounter (Signed)
Patient's coumadin clinic appt tomorrow for INR rescheduled for draw in Lab.    Orders placed.

## 2018-06-09 ENCOUNTER — Telehealth: Payer: Self-pay

## 2018-06-09 ENCOUNTER — Other Ambulatory Visit: Payer: PPO

## 2018-06-09 ENCOUNTER — Ambulatory Visit: Payer: Self-pay

## 2018-06-09 ENCOUNTER — Ambulatory Visit: Payer: PPO

## 2018-06-09 DIAGNOSIS — Z7901 Long term (current) use of anticoagulants: Secondary | ICD-10-CM

## 2018-06-09 DIAGNOSIS — Z7902 Long term (current) use of antithrombotics/antiplatelets: Secondary | ICD-10-CM

## 2018-06-09 LAB — POCT INR: INR: 3.2 — AB (ref 2.0–3.0)

## 2018-06-09 NOTE — Telephone Encounter (Signed)
Pt states she would like to wait on this test, states she is having a lot of other problems.

## 2018-06-09 NOTE — Patient Instructions (Signed)
INR today 3.2   *patient underwent (Tikosyn loading) 1 week ago.  Will continue to check weekly X 4 weeks post procedure.   Hold coumadin today (2/4) and then continue on  1 pill (2mg ) daily EXCEPT for 1/2 pill (1mg ) on Sunday/Tues/ Thursdays.  Recheck in 1 week.

## 2018-06-09 NOTE — Telephone Encounter (Signed)
-----   Message from Clarisse Gouge sent at 05/20/2018  4:59 PM EST ----- Regarding: FW: LEA No ans no vm  ----- Message ----- From: Ricci Barker, RN Sent: 05/20/2018   3:11 PM EST To: Rebeca Alert [facility] Scheduling Subject: LEA                                            Hello, This patient needs her LEA rescheduled please. Thank you, Lattie Haw

## 2018-06-09 NOTE — Telephone Encounter (Signed)
The patient had her BMP/ Magnesium level checked- reviewed by Dr. Caryl Comes:  Notes recorded by [name] Sprang, MD on 06/09/2018 at 1:43 PM EST Please Inform Patient that previous abnormality in Cr  is improved   I called and notified the patient of her results. She states that her cramping is gone. However, she is having mild nausea and her dizziness is back. Nausea started prior to discharge. Dizziness has been over the weekend, but she feels like she is still in rhythm.  I advised the patient that she should keep her follow up appointment on Thursday 2/6 with Roderic Palau, NP in the a-fib clinic.  The patient voiced understanding and was agreeable.

## 2018-06-11 ENCOUNTER — Ambulatory Visit (HOSPITAL_COMMUNITY): Payer: PPO | Admitting: Nurse Practitioner

## 2018-06-11 NOTE — Progress Notes (Signed)
I have reviewed this visit and I agree on the patient's plan of dosage and recommendations. Deborah B Gessner, FNP   

## 2018-06-12 ENCOUNTER — Encounter (HOSPITAL_COMMUNITY): Payer: Self-pay | Admitting: Nurse Practitioner

## 2018-06-12 ENCOUNTER — Other Ambulatory Visit (HOSPITAL_COMMUNITY): Payer: Self-pay | Admitting: *Deleted

## 2018-06-12 ENCOUNTER — Ambulatory Visit (HOSPITAL_COMMUNITY)
Admission: RE | Admit: 2018-06-12 | Discharge: 2018-06-12 | Disposition: A | Discharge status: Home / Self Care | Payer: PPO | Source: Ambulatory Visit | Attending: Nurse Practitioner | Admitting: Nurse Practitioner

## 2018-06-12 VITALS — BP 142/64 | HR 63 | Ht 66.5 in | Wt 181.4 lb

## 2018-06-12 DIAGNOSIS — Z950 Presence of cardiac pacemaker: Secondary | ICD-10-CM | POA: Insufficient documentation

## 2018-06-12 DIAGNOSIS — N189 Chronic kidney disease, unspecified: Secondary | ICD-10-CM | POA: Diagnosis not present

## 2018-06-12 DIAGNOSIS — J449 Chronic obstructive pulmonary disease, unspecified: Secondary | ICD-10-CM | POA: Diagnosis not present

## 2018-06-12 DIAGNOSIS — E1122 Type 2 diabetes mellitus with diabetic chronic kidney disease: Secondary | ICD-10-CM | POA: Insufficient documentation

## 2018-06-12 DIAGNOSIS — Z8249 Family history of ischemic heart disease and other diseases of the circulatory system: Secondary | ICD-10-CM | POA: Diagnosis not present

## 2018-06-12 DIAGNOSIS — Z905 Acquired absence of kidney: Secondary | ICD-10-CM | POA: Diagnosis not present

## 2018-06-12 DIAGNOSIS — Z882 Allergy status to sulfonamides status: Secondary | ICD-10-CM | POA: Insufficient documentation

## 2018-06-12 DIAGNOSIS — E7800 Pure hypercholesterolemia, unspecified: Secondary | ICD-10-CM | POA: Diagnosis not present

## 2018-06-12 DIAGNOSIS — Z79899 Other long term (current) drug therapy: Secondary | ICD-10-CM | POA: Insufficient documentation

## 2018-06-12 DIAGNOSIS — I4819 Other persistent atrial fibrillation: Secondary | ICD-10-CM | POA: Insufficient documentation

## 2018-06-12 DIAGNOSIS — Z885 Allergy status to narcotic agent status: Secondary | ICD-10-CM | POA: Diagnosis not present

## 2018-06-12 DIAGNOSIS — Z87891 Personal history of nicotine dependence: Secondary | ICD-10-CM | POA: Insufficient documentation

## 2018-06-12 DIAGNOSIS — Z85528 Personal history of other malignant neoplasm of kidney: Secondary | ICD-10-CM | POA: Insufficient documentation

## 2018-06-12 DIAGNOSIS — I130 Hypertensive heart and chronic kidney disease with heart failure and stage 1 through stage 4 chronic kidney disease, or unspecified chronic kidney disease: Secondary | ICD-10-CM | POA: Insufficient documentation

## 2018-06-12 DIAGNOSIS — Z7901 Long term (current) use of anticoagulants: Secondary | ICD-10-CM | POA: Insufficient documentation

## 2018-06-12 DIAGNOSIS — I5032 Chronic diastolic (congestive) heart failure: Secondary | ICD-10-CM | POA: Diagnosis not present

## 2018-06-12 DIAGNOSIS — I2510 Atherosclerotic heart disease of native coronary artery without angina pectoris: Secondary | ICD-10-CM | POA: Diagnosis not present

## 2018-06-12 LAB — COMPREHENSIVE METABOLIC PANEL
ALT: 14 U/L (ref 0–44)
AST: 20 U/L (ref 15–41)
Albumin: 3.8 g/dL (ref 3.5–5.0)
Alkaline Phosphatase: 60 U/L (ref 38–126)
Anion gap: 10 (ref 5–15)
BUN: 17 mg/dL (ref 8–23)
CO2: 29 mmol/L (ref 22–32)
Calcium: 9 mg/dL (ref 8.9–10.3)
Chloride: 100 mmol/L (ref 98–111)
Creatinine, Ser: 1.5 mg/dL — ABNORMAL HIGH (ref 0.44–1.00)
GFR calc Af Amer: 37 mL/min — ABNORMAL LOW (ref >60)
GFR calc non Af Amer: 32 mL/min — ABNORMAL LOW (ref >60)
Glucose, Bld: 96 mg/dL (ref 70–99)
Potassium: 3.5 mmol/L (ref 3.5–5.1)
Sodium: 139 mmol/L (ref 135–145)
Total Bilirubin: 1.1 mg/dL (ref 0.3–1.2)
Total Protein: 6.3 g/dL — ABNORMAL LOW (ref 6.5–8.1)

## 2018-06-12 LAB — CBC
HCT: 31.2 % — ABNORMAL LOW (ref 36.0–46.0)
Hemoglobin: 8.3 g/dL — ABNORMAL LOW (ref 12.0–15.0)
MCH: 18.9 pg — ABNORMAL LOW (ref 26.0–34.0)
MCHC: 26.6 g/dL — ABNORMAL LOW (ref 30.0–36.0)
MCV: 70.9 fL — ABNORMAL LOW (ref 80.0–100.0)
Platelets: 259 10*3/uL (ref 150–400)
RBC: 4.4 MIL/uL (ref 3.87–5.11)
RDW: 19.7 % — ABNORMAL HIGH (ref 11.5–15.5)
WBC: 3.9 10*3/uL — ABNORMAL LOW (ref 4.0–10.5)
nRBC: 0 % (ref 0.0–0.2)

## 2018-06-12 LAB — TSH: TSH: 6.001 u[IU]/mL — ABNORMAL HIGH (ref 0.350–4.500)

## 2018-06-12 LAB — MAGNESIUM: Magnesium: 2.3 mg/dL (ref 1.7–2.4)

## 2018-06-12 MED ORDER — POTASSIUM CHLORIDE CRYS ER 20 MEQ PO TBCR: 20.0000 meq | EXTENDED_RELEASE_TABLET | Freq: Two times a day (BID) | ORAL | 3 refills | Status: DC

## 2018-06-12 NOTE — Progress Notes (Addendum)
Primary Care Physician: Elby Beck, FNP Referring Physician: Dr.Klein    [name] is a 85 y.o. female with a h/o persistent afib, CHF that is in the afib clinic for dofetilide admit. Her INR this am was therapeutic at 2.3, Dr. Caryl Comes was OK for her just having the last 3 weeks of therapeutic INR's.  No benadryl use. She is aware of the cost of the drug.She did use amiodarone briefly. Last used 03/27/18. This was cleared with Dr. Caryl Comes as he is ok for her to come in for dofetilide  as she used a very short amount of time.  S/p Tikosyn loading, 2/7, converted to SR without DCCV. In the clinic today, she reports not feeling any better, actually feels worse. She feels just as short of breath as she did in afib. Her EKG shows  AV paced. She reports nausea/ anorexia with some bilateral  finger tingling since starting on Tikosyn. She is willing to give it a little more time to see if she adjusts to the drug but does not think she can tolerate these symptoms long term.    Today, she denies symptoms of palpitations, chest pain,  orthopnea, PND, lower extremity edema, dizziness, presyncope, syncope, or neurologic sequela.+ for nausea, anorexia. The patient is tolerating medications without difficulties and is otherwise without complaint today.   Past Medical History:  Diagnosis Date  . Adjustment disorder with anxiety   . Anginal pain ([place])   . Arthritis    "some in my hands" (11/11/2012)  . Atrial fibrillation ([place])   . Automatic implantable cardioverter-defibrillator in situ   . Blood in stool   . Carotid artery stenosis 09/2007   60-79% bilateral (stable)  . Carotid artery stenosis 10/2008   40-59% R 60-79%   . Chest pain, unspecified   . Chronic airway obstruction, not elsewhere classified   . Chronic bronchitis ([place])    "get it some; not q year" (11/11/2012)  . Chronic diastolic CHF (congestive heart failure) ([place]) 06/04/2013  . Chronic kidney disease, unspecified   . Coronary  artery disease    non-obstructive by 2006 cath  . Dysuria   . Edema   . Encounter for therapeutic drug monitoring   . Exertional shortness of breath   . Frequent UTI    "get them a couple times/yr" (11/11/2012)  . Hemorrhage of rectum and anus   . High cholesterol   . History of blood transfusion 04/2011   "after hip OR" (11/11/2012)  . Hypertension 05/20/2011  . Hypertr obst cardiomyop   . Hypotension, unspecified    cardiac cath 2006..nonobstructive CAD 30-40s lesions.Marland KitchenETT 1/09 nondiagnostic due to poor HR response..Right Renal Cancer 2003  . Long term (current) use of anticoagulants   . Malignant neoplasm of kidney, except pelvis   . Osteoarthritis of right hip   . Other acute sinusitis   . Other and unspecified coagulation defects   . Other malaise and fatigue   . Other screening mammogram   . PONV (postoperative nausea and vomiting)   . Pre-operative cardiovascular examination   . Renal cancer ([place]) 06/2001   Right  . Secondary cardiomyopathy, unspecified   . Sinus bradycardia   . Special screening for osteoporosis   . Urge incontinence    Past Surgical History:  Procedure Laterality Date  . ABDOMINAL HYSTERECTOMY  1975   for benign causes  . APPENDECTOMY    . BI-VENTRICULAR PACEMAKER UPGRADE  05/04/2010  . CARDIAC CATHETERIZATION  2006  . CARDIOVERSION N/A  02/20/2018   Procedure: CARDIOVERSION;  Surgeon: Minna Merritts, MD;  Location: [facility];  Service: Cardiovascular;  Laterality: N/A;  . CARDIOVERSION N/A 03/27/2018   Procedure: CARDIOVERSION;  Surgeon: Minna Merritts, MD;  Location: [facility];  Service: Cardiovascular;  Laterality: N/A;  . CATARACT EXTRACTION W/ INTRAOCULAR LENS  IMPLANT, BILATERAL  01/2006-02-2006  . CHOLECYSTECTOMY N/A 11/11/2012   Procedure: LAPAROSCOPIC CHOLECYSTECTOMY WITH INTRAOPERATIVE CHOLANGIOGRAM;  Surgeon: Imogene Burn. Georgette Dover, MD;  Location: [facility];  Service: General;  Laterality: N/A;  . EP IMPLANTABLE DEVICE N/A 02/21/2016   Procedure:  ICD Generator Changeout;  Surgeon: Deboraha Sprang, MD;  Location: [place] [facility];  Service: Cardiovascular;  Laterality: N/A;  . INSERT / REPLACE / REMOVE PACEMAKER  05-01-11   02-28-05-/05-04-10-ICD-MEDTRONIC MAXIMAL DR  . JOINT REPLACEMENT    . LAPAROSCOPIC CHOLECYSTECTOMY  11/11/2012  . LAPAROSCOPIC LYSIS OF ADHESIONS N/A 11/11/2012   Procedure: LAPAROSCOPIC LYSIS OF ADHESIONS;  Surgeon: Imogene Burn. Georgette Dover, MD;  Location: [facility];  Service: General;  Laterality: N/A;  . NEPHRECTOMY Right 06/2001    S/P RENAL CELL CANCER  . TOTAL HIP ARTHROPLASTY Right 05/03/2011   Procedure: TOTAL HIP ARTHROPLASTY ANTERIOR APPROACH;  Surgeon: Mcarthur Rossetti;  Location: [facility];  Service: Orthopedics;  Laterality: Right;  Removal of Cannulated Screws Right Hip, Right Direct Anterior Hip Replacement    Current Outpatient Medications  Medication Sig Dispense Refill  . acetaminophen (TYLENOL) 500 MG tablet Take 1,000 mg by mouth daily as needed for headache.     . carvedilol (COREG) 12.5 MG tablet Take 1/2 tablet (6.25 mg) once daily in the morning 180 tablet 3  . cetirizine (ZYRTEC) 10 MG tablet Take 10 mg by mouth daily.     Marland Kitchen CRANBERRY PO Take 4 tablets by mouth daily.    Marland Kitchen dofetilide (TIKOSYN) 125 MCG capsule Take 1 capsule (125 mcg total) by mouth 2 (two) times daily. 60 capsule 6  . ezetimibe (ZETIA) 10 MG tablet Take 1 tablet (10 mg total) by mouth daily. 30 tablet 11  . fluticasone (FLONASE) 50 MCG/ACT nasal spray Place 2 sprays into both nostrils daily. (Patient taking differently: Place 2 sprays into both nostrils daily as needed for allergies or rhinitis. ) 16 g 6  . hydrocortisone 2.5 % cream Apply 1 application topically daily as needed (itching).     . magnesium oxide (MAG-OX) 400 MG tablet Take 1 tablet (400 mg total) by mouth daily. 30 tablet 2  . potassium chloride SA (K-DUR,KLOR-CON) 20 MEQ tablet Take 1 tablet (20 meq) by mouth once daily (Patient not taking: Reported on 06/02/2018) 30  tablet 0  . rosuvastatin (CRESTOR) 40 MG tablet Take 1 tablet (40 mg total) by mouth daily. 90 tablet 3  . spironolactone (ALDACTONE) 25 MG tablet Take 1 tablet (25 mg total) by mouth daily. 30 tablet 3  . torsemide (DEMADEX) 20 MG tablet Take 2 tablets (40 mg) by mouth once daily (Patient not taking: Reported on 06/02/2018)    . warfarin (COUMADIN) 2 MG tablet Take 1 1/2 tablets daily or AS DIRECTED BY ANTICOAGULATION CLINIC. (Patient taking differently: Take 1-2 mg by mouth See admin instructions. Take  tablet (1mg ) by mouth every Sunday, Tuesday and Thursday evening and take 1 tablet (2mg ) by mouth every Monday, Wednesday, Friday and Saturday evening) 130 tablet 1   No current facility-administered medications for this visit.     Allergies  Allergen Reactions  . Codeine Nausea And Vomiting  . Morphine And Related Nausea  And Vomiting  . Sulfonamide Derivatives Other (See Comments)    Dry mouth    Social History   Socioeconomic History  . Marital status: Widowed    Spouse name: Not on file  . Number of children: Not on file  . Years of education: Not on file  . Highest education level: Not on file  Occupational History  . Occupation: Retired    Fish farm manager: RETIRED  Social Needs  . Financial resource strain: Not on file  . Food insecurity:    Worry: Not on file    Inability: Not on file  . Transportation needs:    Medical: Not on file    Non-medical: Not on file  Tobacco Use  . Smoking status: Former Smoker    Packs/day: 0.50    Years: 40.00    Pack years: 20.00    Types: Cigarettes    Last attempt to quit: 05/06/2001    Years since quitting: 17.1  . Smokeless tobacco: Never Used  Substance and Sexual Activity  . Alcohol use: No    Alcohol/week: 0.0 standard drinks  . Drug use: No  . Sexual activity: Never  Lifestyle  . Physical activity:    Days per week: Not on file    Minutes per session: Not on file  . Stress: Not on file  Relationships  . Social connections:     Talks on phone: Not on file    Gets together: Not on file    Attends religious service: Not on file    Active member of club or organization: Not on file    Attends meetings of clubs or organizations: Not on file    Relationship status: Not on file  . Intimate partner violence:    Fear of current or ex partner: Not on file    Emotionally abused: Not on file    Physically abused: Not on file    Forced sexual activity: Not on file  Other Topics Concern  . 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   ALCOHOL USE-NO   RETIRED   FORMER TOBACCO USE.....1/2 PACK X 40 YRS UNTIL 2003   Would desire CPR    Family History  Problem Relation Age of Onset  . Heart failure Mother   . Breast cancer Maternal Aunt 82  . Breast cancer Cousin   . Breast cancer Other   . Colon cancer Neg Hx     ROS- All systems are reviewed and negative except as per the HPI above  Physical Exam: There were no vitals filed for this visit. Wt Readings from Last 3 Encounters:  06/04/18 182 lb 6.4 oz (82.7 kg)  06/01/18 182 lb (82.6 kg)  05/22/18 188 lb (85.3 kg)    Labs: Lab Results  Component Value Date   NA 138 06/08/2018   K 4.0 06/08/2018   CL 98 06/08/2018   CO2 33 (H) 06/08/2018   GLUCOSE 88 06/08/2018   BUN 21 06/08/2018   CREATININE 1.40 (H) 06/08/2018   CALCIUM 9.2 06/08/2018   PHOS 3.8 09/18/2017   MG 2.2 06/08/2018   Lab Results  Component Value Date   INR 3.2 (A) 06/09/2018   Lab Results  Component Value Date   CHOL 179 03/19/2016   HDL 39.10 03/19/2016   LDLCALC 118 (H) 03/19/2016  TRIG 110.0 03/19/2016     GEN- The patient is well appearing, alert and oriented x 3 today.   HEENT-head normocephalic, atraumatic, sclera clear, conjunctiva pink, hearing intact, trachea midline. Lungs- Clear to ausculation  bilaterally, normal work of breathing Heart-  Av paced  rhythm, no murmurs, rubs or gallops  GI- soft, NT, ND, + BS Extremities- no clubbing, cyanosis, or edema MS- no significant deformity or atrophy Skin- no rash or lesion Psych- euthymic mood, full affect Neuro- strength and sensation are intact   EKG- AV dual paced, qtc interval looks unchanged as to hospitalization qt's,  paced  brief interrogation shows  SR today some intervals of possible atrial tach   Assessment and Plan:  1.Persisistent afib Intolerant of briefly used amiodarone (stopped 03/27/18) S/p Tikosyn loading 06/01/18. Converted without DCCV. Now states that she is not tolerating dofetilide  with nausea/anorexia and some paresthesia of bilateral fingers.  QT stable, reviewed with Dr. Caryl Comes General precautions with Tikosyn reviewed.  No benadryl use Check Bmet/mag/ cbc/tsh today Per Dr. Caryl Comes if she is not tolerating next week, ok to stop tikosyn He would like for her to have an updated echo  This patients CHA2DS2-VASc Score and unadjusted Ischemic Stroke Rate (% per year) is equal to 9.7 % stroke rate/year from a score of 6  Above score calculated as 1 point each if present [CHF, HTN, DM, Vascular=MI/PAD/Aortic Plaque, Age if 65-74, or Female] Above score calculated as 2 points each if present [Age > 75, or Stroke/TIA/TE]  2. Chronic HFpEF No overt signs of fluid overload   3. HTN Stable, no changes today  Weight appears stable  Pt is willing to use dofetilide a little longer to see if symptoms improve, and will let us how she is tolerating next week Follow up with Dr Caryl Comes as scheduled 3/2.  Addendum -2/11- I spoke to Dr. Caryl Comes after her office appointment last Friday and he said if pt called to office this week and still not tolerating dofetilide, to stop drug. Pt called today and states her symptoms are are not any better, so will stop dofetilide and have pt f/u with Dr. Caryl Comes in 7-10 days.  Geroge Baseman  [place], [facility] [address] [place], [place] [address] [phone]

## 2018-06-12 NOTE — Telephone Encounter (Signed)
-----   Message from Juluis Mire, RN sent at 06/12/2018  2:40 PM EST ----- Regarding: echo appt Pt was in hospital for 1/30 echo appt - please call pt to resch before his appt with klein  Thanks! Stacy RN [facility]

## 2018-06-12 NOTE — Telephone Encounter (Signed)
lmov to schedule echo

## 2018-06-15 ENCOUNTER — Other Ambulatory Visit: Payer: Self-pay | Admitting: Family Medicine

## 2018-06-15 DIAGNOSIS — D631 Anemia in chronic kidney disease: Secondary | ICD-10-CM

## 2018-06-15 DIAGNOSIS — N184 Chronic kidney disease, stage 4 (severe): Principal | ICD-10-CM

## 2018-06-15 DIAGNOSIS — E039 Hypothyroidism, unspecified: Secondary | ICD-10-CM

## 2018-06-15 MED ORDER — LEVOTHYROXINE SODIUM 25 MCG PO TABS: 25.0000 ug | ORAL_TABLET | Freq: Every day | ORAL | 1 refills | Status: DC

## 2018-06-15 NOTE — Progress Notes (Addendum)
TSH up, add levothyroxine 25 mcg. Recheck in 8 weeks. After further review, TSH trending down, will hold off on starting levothyroxine due to precarious cardiac status and will recheck in 2-3 months.

## 2018-06-16 ENCOUNTER — Telehealth (HOSPITAL_COMMUNITY): Payer: Self-pay | Admitting: *Deleted

## 2018-06-16 NOTE — Addendum Note (Signed)
Encounter addended by: Sherran Needs, NP on: 06/16/2018 4:25 PM  Actions taken: Clinical Note Signed

## 2018-06-16 NOTE — Telephone Encounter (Signed)
error 

## 2018-06-16 NOTE — Telephone Encounter (Signed)
Pt cld in today stating that she is still SOB, having numbness in her finger tips, feeling nauseated and dizzy.  Per Roderic Palau, NP last discussion with Dr. Caryl Comes was for her to stop Tikosyn and get scheduled to see him in the next 5-7 days.  Pt was instructed on this.  Note sent to scheduling ([place]) and Melissa for pt to be contacted for appt.

## 2018-06-18 ENCOUNTER — Encounter: Payer: Self-pay | Admitting: Internal Medicine

## 2018-06-18 ENCOUNTER — Other Ambulatory Visit: Payer: Self-pay | Admitting: Internal Medicine

## 2018-06-18 ENCOUNTER — Telehealth: Payer: Self-pay | Admitting: Internal Medicine

## 2018-06-18 NOTE — Telephone Encounter (Signed)
Spoke with patients daughter per release form and she wanted to know if she could get appointment with Dr. Caryl Comes. Reviewed that she has appointment on Monday and he is only here on [place]. She states that her breathing has not been good and she just does not look good. Reviewed signs and symptoms that need further evaluation in ED if her symptoms persist or worsen before her appointment on Monday. She verbalized understanding of our conversation, confirmed appointment, date, time, location, and had no further questions at this time.

## 2018-06-18 NOTE — Telephone Encounter (Signed)
Please call grandaughter(not on DPR) regarding the medication that has been stopped.

## 2018-06-18 NOTE — Telephone Encounter (Signed)
Contacted pt to clarify how she is taking torsemide no answer/no vm setup.

## 2018-06-18 NOTE — Telephone Encounter (Signed)
Please advise if ok to refill Torsemide 20 mg tablet. Pt is taking differently than how Rx was prescribed due to [place] telling her to take it this was as listed below. Pt takes 1 tablet daily and if weight is up she takes additional 20 mg tablet.  Pt refill request 30 day.

## 2018-06-19 ENCOUNTER — Telehealth: Payer: Self-pay

## 2018-06-19 ENCOUNTER — Other Ambulatory Visit: Payer: Self-pay | Admitting: Internal Medicine

## 2018-06-19 NOTE — Telephone Encounter (Signed)
-----   Message from Elby Beck, [place] sent at 06/15/2018  5:39 PM EST ----- Please call patient and let her know that I have reviewed her chart going back to 2012. She has had a stable anemia since at least then, maybe before the records were put on the computer. It has been thought that this is anemia of chronic illness related to her decreased kidney function and heart failure. I will check her iron level and send her with a stool test to check for bleeding in the intestines. If she is in seeing Leafy Ro, she can get these done before her visit with me next month, otherwise we can do next month.

## 2018-06-19 NOTE — Telephone Encounter (Signed)
Attempted to reach pt on cell, vm is not set up. Please see Debbie's note below

## 2018-06-22 ENCOUNTER — Ambulatory Visit: Payer: PPO | Admitting: Internal Medicine

## 2018-06-22 ENCOUNTER — Ambulatory Visit (INDEPENDENT_AMBULATORY_CARE_PROVIDER_SITE_OTHER): Payer: PPO

## 2018-06-22 ENCOUNTER — Encounter: Payer: Self-pay | Admitting: Internal Medicine

## 2018-06-22 VITALS — BP 138/64 | HR 68 | Ht 66.5 in | Wt 179.4 lb

## 2018-06-22 DIAGNOSIS — I428 Other cardiomyopathies: Secondary | ICD-10-CM | POA: Diagnosis not present

## 2018-06-22 DIAGNOSIS — I495 Sick sinus syndrome: Secondary | ICD-10-CM

## 2018-06-22 DIAGNOSIS — Z95810 Presence of automatic (implantable) cardiac defibrillator: Secondary | ICD-10-CM

## 2018-06-22 DIAGNOSIS — I5032 Chronic diastolic (congestive) heart failure: Secondary | ICD-10-CM | POA: Diagnosis not present

## 2018-06-22 DIAGNOSIS — I4819 Other persistent atrial fibrillation: Secondary | ICD-10-CM

## 2018-06-22 MED ORDER — METOLAZONE 2.5 MG PO TABS: 2.5000 mg | ORAL_TABLET | ORAL | 3 refills | Status: DC

## 2018-06-22 NOTE — Patient Instructions (Addendum)
Medication Instructions:  Your physician has recommended you make the following change in your medication:   Take Metolazone, 2.5mg  tablet, once per week. Take an extra 102mEq of potassium on the day you take your metolazone.  Labwork: None ordered.  Testing/Procedures: None ordered.  Follow-Up: Your physician recommends that you schedule a follow-up appointment in:   3 months with Dr Caryl Comes  Any Other Special Instructions Will Be Listed Below (If Applicable).     If you need a refill on your cardiac medications before your next appointment, please call your pharmacy.

## 2018-06-22 NOTE — Progress Notes (Signed)
Patient Care Team: Elby Beck, FNP as PCP - General (Nurse Practitioner) Deboraha Sprang, MD as PCP - Cardiology (Cardiology) Minna Merritts, MD as Consulting Physician (Cardiology) Deboraha Sprang, MD as Consulting Physician (Cardiology) Delana Meyer, Dolores Lory, MD as Consulting Physician (Vascular Surgery)   HPI  [name] is a 85 y.o. female Seen in followup for nonischemic cardiomyopathy complicated by acute/chronic heart failure and persistent atrial fibrillation.  She has a primary prevention ICD implanted 2006 with generator replacement x2 most recently 10/17  Underwent DCCV last week; periprocedural labs were notable for K 2.8 and Cr 2.08          She has persistent  atrial fibrillation; previously treated with disopyramide changed to  flecainide. But 2/2 side effects  back on disopyramide. ( the d/c summary 4/16 was WRONG-describing the use of amiodarone)   when seen 9/19 with significant atrial fibrillation burden disopyramide was discontinued and amiodarone subsequently initiated; she has significant nausea prompting his discontinuation.  1/20 she was admitted for dofetilide initiation.  Converted spontaneously.  Maintain sinus rhythm.  However, has been unable to tolerate it has been discontinued.  Atrial fibrillation has been associated with recurrent episodes of heart failure.  Diuresis is been challenged by her renal insufficiency.  Have used intermittent metolazone.   Antiarrhythmics Date Reason stopped  disopyramide 2019 ineffective  amiodarone 2019 nausea  dofetilide 2020 intolerant   Breathing is better than most days Still with significant edema Long standing venous varicosities  But metolazone assoc with significant improvement       Cath 2006 without obstructive disease DATE TEST EF   2006 LHC `` No Obstructive CAD  1/15 Echo  65 % Mod Pulm HTN, TR and Mild MR   4/16 Echo   65 % Mod-sev PulmHTN with RVE, Mod MR severe TR  6/17  Echo  65% Mild-mod MR  12/18 Myoview 65% No ischemia         Anticoagulation with warfarin.  No bleeding  Never been on NOAC        Date Cr K Hgb TSH LFTs  12/17 1.63 3.5 9.5    7/18 1.5 4.0 10.4    5/19 1.42 4.2 10.6    10/19 2.08 2.8 9.3 7.28   10/19 1.81 3.1 9.1    12/19 1.68 3.5 8.7              CHADS-VASc score of greater than or equal to 6 (age-10, gender-1, TIA-2 (4/16), heart failure-1)     She has a great grandson [name] to whom she is very attached.  He is a Furniture conservator/restorer and a cross-country running     Past Surgical History:  Procedure Laterality Date  . ABDOMINAL HYSTERECTOMY  1975   for benign causes  . APPENDECTOMY    . BI-VENTRICULAR PACEMAKER UPGRADE  05/04/2010  . CARDIAC CATHETERIZATION  2006  . CARDIOVERSION N/A 02/20/2018   Procedure: CARDIOVERSION;  Surgeon: Minna Merritts, MD;  Location: [facility];  Service: Cardiovascular;  Laterality: N/A;  . CARDIOVERSION N/A 03/27/2018   Procedure: CARDIOVERSION;  Surgeon: Minna Merritts, MD;  Location: [facility];  Service: Cardiovascular;  Laterality: N/A;  . CATARACT EXTRACTION W/ INTRAOCULAR LENS  IMPLANT, BILATERAL  01/2006-02-2006  . CHOLECYSTECTOMY N/A 11/11/2012   Procedure: LAPAROSCOPIC CHOLECYSTECTOMY WITH INTRAOPERATIVE CHOLANGIOGRAM;  Surgeon: Imogene Burn. Georgette Dover, MD;  Location: [facility];  Service: General;  Laterality: N/A;  . EP IMPLANTABLE DEVICE N/A 02/21/2016  Procedure: ICD Generator Changeout;  Surgeon: Deboraha Sprang, MD;  Location: [facility];  Service: Cardiovascular;  Laterality: N/A;  . INSERT / REPLACE / REMOVE PACEMAKER  05-01-11   02-28-05-/05-04-10-ICD-MEDTRONIC MAXIMAL DR  . JOINT REPLACEMENT    . LAPAROSCOPIC CHOLECYSTECTOMY  11/11/2012  . LAPAROSCOPIC LYSIS OF ADHESIONS N/A 11/11/2012   Procedure: LAPAROSCOPIC LYSIS OF ADHESIONS;  Surgeon: Imogene Burn. Georgette Dover, MD;  Location: [facility];  Service: General;  Laterality: N/A;  . NEPHRECTOMY Right 06/2001    S/P RENAL CELL CANCER  . TOTAL  HIP ARTHROPLASTY Right 05/03/2011   Procedure: TOTAL HIP ARTHROPLASTY ANTERIOR APPROACH;  Surgeon: Mcarthur Rossetti;  [facility];  Service: Orthopedics;  Laterality: Right;  Removal of Cannulated Screws Right Hip, Right Direct Anterior Hip Replacement    Current Outpatient Medications  Medication Sig Dispense Refill  . acetaminophen (TYLENOL) 500 MG tablet Take 1,000 mg by mouth daily as needed for headache.     . carvedilol (COREG) 12.5 MG tablet Take 1/2 tablet (6.25 mg) once daily in the morning 180 tablet 3  . cetirizine (ZYRTEC) 10 MG tablet Take 10 mg by mouth daily.     Marland Kitchen CRANBERRY PO Take 4 tablets by mouth daily.    Marland Kitchen ezetimibe (ZETIA) 10 MG tablet Take 1 tablet (10 mg total) by mouth daily. 30 tablet 11  . fluticasone (FLONASE) 50 MCG/ACT nasal spray Place 2 sprays into both nostrils daily. 16 g 6  . hydrocortisone 2.5 % cream Apply 1 application topically daily as needed (itching).     . magnesium oxide (MAG-OX) 400 MG tablet Take 1 tablet (400 mg total) by mouth daily. 30 tablet 2  . potassium chloride SA (K-DUR,KLOR-CON) 20 MEQ tablet Take 1 tablet (20 mEq total) by mouth 2 (two) times daily. 60 tablet 3  . rosuvastatin (CRESTOR) 40 MG tablet Take 1 tablet (40 mg total) by mouth daily. 90 tablet 3  . spironolactone (ALDACTONE) 25 MG tablet Take 1 tablet (25 mg total) by mouth daily. 30 tablet 3  . torsemide (DEMADEX) 20 MG tablet Take 1 tablet (20 mg) by mouth once daily, take an extra tablet (20 mg) once daily as needed for weight > 187 lbs 60 tablet 11  . warfarin (COUMADIN) 2 MG tablet Take 1 1/2 tablets daily or AS DIRECTED BY ANTICOAGULATION CLINIC. 130 tablet 1   No current facility-administered medications for this visit.     Allergies  Allergen Reactions  . Codeine Nausea And Vomiting  . Morphine And Related Nausea And Vomiting  . Sulfonamide Derivatives Other (See Comments)    Dry mouth    Review of Systems negative except from HPI and PMH  Physical  Exam BP 138/64   Pulse 68   Ht 5' 6.5" (1.689 m)   Wt 179 lb 6.4 oz (81.4 kg)   SpO2 98%   BMI 28.52 kg/m  Well developed and nourished in no acute distress HENT normal Neck supple with JVP-8 Carotids brisk and full without bruits Clear Regular rate and rhythm  2/6  Murmur  Abd-soft with active BS without hepatomegaly No Clubbing cyanosis 2+edema Skin-warm and dry A & Oriented  Grossly normal sensory and motor function    ECG atrial fib with ventricular pacing   Assessment and  Plan  Nonischemic cardiomyopathy with interval normalization of LV function  Sinus node dysfunction device dependent   Mitral regurgitation  Complete heart block intermittent  Congestive heart failure acute chronic/diastolic class IIb-IIIa  Atrial fibrillation/flutter persistent  and prob now permanent   Chronic Renal insufficiency grade 3    Implantable defibrillator    Pt has failed AAD for afib  I am not inclined to use dronaderone but would consider sotalol which could be introduced at [facility]  She will discuss with her daughter  IU am not sanguine about our ability to recover sinus at all   We will continue her on once weekly metolazone and have reviewed the importance of salt and fluid restriction..  Will prob need repeat echo with 100% Vp to exclude a contribution of pacemaker associated cardiomyopathy  We spent more than 50% of our >25 min visit in face to face counseling regarding the above

## 2018-06-23 LAB — CUP PACEART INCLINIC DEVICE CHECK
Battery Remaining Longevity: 61 mo
Battery Voltage: 2.98 V
Brady Statistic AP VP Percent: 52.38 %
Brady Statistic AP VS Percent: 0 %
Brady Statistic AS VP Percent: 47.39 %
Brady Statistic AS VS Percent: 0.23 %
Brady Statistic RA Percent Paced: 48.17 %
Brady Statistic RV Percent Paced: 99.74 %
Date Time Interrogation Session: 20191203183624
HighPow Impedance: 36 Ohm
HighPow Impedance: 45 Ohm
Implantable Lead Implant Date: 20061026
Implantable Lead Implant Date: 20111230
Implantable Lead Location: 753859
Implantable Lead Location: 753860
Implantable Lead Model: 5076
Implantable Lead Model: 6947
Implantable Pulse Generator Implant Date: 20171018
Lead Channel Impedance Value: 361 Ohm
Lead Channel Impedance Value: 418 Ohm
Lead Channel Impedance Value: 418 Ohm
Lead Channel Pacing Threshold Amplitude: 0.75 V
Lead Channel Pacing Threshold Amplitude: 1.5 V
Lead Channel Pacing Threshold Pulse Width: 0.4 ms
Lead Channel Pacing Threshold Pulse Width: 0.6 ms
Lead Channel Sensing Intrinsic Amplitude: 1.75 mV
Lead Channel Setting Pacing Amplitude: 2.5 V
Lead Channel Setting Pacing Amplitude: 2.5 V
Lead Channel Setting Pacing Pulse Width: 0.4 ms
Lead Channel Setting Sensing Sensitivity: 0.3 mV

## 2018-06-23 LAB — CUP PACEART REMOTE DEVICE CHECK
Battery Remaining Longevity: 57 mo
Battery Voltage: 2.96 V
Brady Statistic AP VP Percent: 43.81 %
Brady Statistic AP VS Percent: 0.09 %
Brady Statistic AS VP Percent: 48.49 %
Brady Statistic AS VS Percent: 7.62 %
Brady Statistic RA Percent Paced: 36.78 %
Brady Statistic RV Percent Paced: 92.27 %
Date Time Interrogation Session: 20200217123826
HighPow Impedance: 35 Ohm
HighPow Impedance: 40 Ohm
Implantable Lead Implant Date: 20061026
Implantable Lead Implant Date: 20111230
Implantable Lead Location: 753859
Implantable Lead Location: 753860
Implantable Lead Model: 5076
Implantable Lead Model: 6947
Implantable Pulse Generator Implant Date: 20171018
Lead Channel Impedance Value: 342 Ohm
Lead Channel Impedance Value: 399 Ohm
Lead Channel Impedance Value: 456 Ohm
Lead Channel Pacing Threshold Amplitude: 0.625 V
Lead Channel Pacing Threshold Pulse Width: 0.4 ms
Lead Channel Sensing Intrinsic Amplitude: 0.375 mV
Lead Channel Sensing Intrinsic Amplitude: 0.375 mV
Lead Channel Sensing Intrinsic Amplitude: 9.125 mV
Lead Channel Sensing Intrinsic Amplitude: 9.125 mV
Lead Channel Setting Pacing Amplitude: 2.5 V
Lead Channel Setting Pacing Amplitude: 2.5 V
Lead Channel Setting Pacing Pulse Width: 0.4 ms
Lead Channel Setting Sensing Sensitivity: 0.3 mV

## 2018-06-26 LAB — CUP PACEART INCLINIC DEVICE CHECK
Battery Remaining Longevity: 57 mo
Battery Voltage: 2.95 V
Brady Statistic AP VP Percent: 42.38 %
Brady Statistic AP VS Percent: 0.08 %
Brady Statistic AS VP Percent: 49.97 %
Brady Statistic AS VS Percent: 7.57 %
Brady Statistic RA Percent Paced: 35.23 %
Brady Statistic RV Percent Paced: 92.33 %
Date Time Interrogation Session: 20200217204210
HighPow Impedance: 38 Ohm
HighPow Impedance: 50 Ohm
Implantable Lead Implant Date: 20061026
Implantable Lead Implant Date: 20111230
Implantable Lead Location: 753859
Implantable Lead Location: 753860
Implantable Lead Model: 5076
Implantable Lead Model: 6947
Implantable Pulse Generator Implant Date: 20171018
Lead Channel Impedance Value: 361 Ohm
Lead Channel Impedance Value: 418 Ohm
Lead Channel Impedance Value: 456 Ohm
Lead Channel Pacing Threshold Amplitude: 0.625 V
Lead Channel Pacing Threshold Pulse Width: 0.4 ms
Lead Channel Sensing Intrinsic Amplitude: 0.375 mV
Lead Channel Sensing Intrinsic Amplitude: 0.75 mV
Lead Channel Sensing Intrinsic Amplitude: 9.125 mV
Lead Channel Sensing Intrinsic Amplitude: 9.625 mV
Lead Channel Setting Pacing Amplitude: 2.5 V
Lead Channel Setting Pacing Amplitude: 2.5 V
Lead Channel Setting Pacing Pulse Width: 0.4 ms
Lead Channel Setting Sensing Sensitivity: 0.3 mV

## 2018-07-01 NOTE — Progress Notes (Signed)
Remote ICD transmission.   

## 2018-07-06 ENCOUNTER — Encounter: Payer: PPO | Admitting: Internal Medicine

## 2018-07-07 ENCOUNTER — Telehealth: Payer: Self-pay

## 2018-07-07 ENCOUNTER — Ambulatory Visit (INDEPENDENT_AMBULATORY_CARE_PROVIDER_SITE_OTHER): Payer: PPO

## 2018-07-07 DIAGNOSIS — Z95810 Presence of automatic (implantable) cardiac defibrillator: Secondary | ICD-10-CM

## 2018-07-07 DIAGNOSIS — I5032 Chronic diastolic (congestive) heart failure: Secondary | ICD-10-CM

## 2018-07-07 NOTE — Progress Notes (Signed)
EPIC Encounter for ICM Monitoring  Patient Name: [name] is a 85 y.o. female Date: 07/07/2018 Primary Care Physican: Elby Beck, [place] Primary Cardiologist: Rockey Situ Electrophysiologist: Caryl Comes LastWeight:189lbs(183-185 lbs) Today's Weight:unknown  Clinical Status (22-Jun-2018 to 07-Jul-2018)  Treated VT/VF 0 episodes  AT/AF 1 episode   Time in AT/AF 24.0 hr/day (100.0%)  Longest AT/AF 19 days  Observations (2) (22-Jun-2018 to 07-Jul-2018)  AT/AF >= 6 hr for 15 days.  Patient Activity less than 1 hr/day for 2 weeks.  Attempted call to patient and unable to reach.   Transmission reviewed.    Report: Thoracic impedance has returned to normal within the last few days.   Prescribed: Torsemide 20 mg Take 1 tablet (20 mg) by mouth once daily, take an extra tablet (20 mg) once daily as needed for weight > 187 lbs.  Klor Con 20 mEq Take 1 tablet (20 mEq total) by mouth 2 (two) times daily.                    Metolazone 2.5 mg Take 1 tablet (2.5 mg total) by mouth once a week. Labs: 06/12/2018 Creatinine 1.50, BUN 17, Potassium 3.5, Sodium 139, GFR 32-37 06/08/2018 Creatinine 1.40, BUN 21, Potassium 4.0, Sodium 138, GFR 35-40  06/04/2018 Creatinine 1.61, BUN 21, Potassium 4.1, Sodium 139, GFR 29-34  06/03/2018 Creatinine 1.53, BUN 19, Potassium 4.2, Sodium 137, GFR 31-36  06/02/2018 Creatinine 1.47, BUN 20, Potassium 4.2, Sodium 140, eGFR 33-38 06/01/2018 Creatinine 1.51, BUN 20, Potassium 4.4, Sodium 138, eGFR 32-37  05/19/2018 Creatinine 1.43, BUN 15, Potassium 4.4, Sodium 143, eGFR 34-39  A complete set of results can be found in Results Review.  Recommendations: Unable to reach.  Follow-up plan: ICM clinic phone appointment on 07/27/2018.    Copy of ICM check sent to Dr. Caryl Comes.   3 month ICM trend: 07/07/2018    AT/AF   1 Year ICM trend:       Rosalene Billings, RN 07/07/2018 8:04 AM

## 2018-07-07 NOTE — Telephone Encounter (Signed)
Remote ICM transmission received.  Attempted call to patient regarding on home and cell phone regarding ICM remote transmission and no answering machine on home number and no voice mail set up on cell phone number.

## 2018-07-09 ENCOUNTER — Encounter: Payer: Self-pay | Admitting: Family Medicine

## 2018-07-09 ENCOUNTER — Ambulatory Visit: Payer: PPO

## 2018-07-09 ENCOUNTER — Ambulatory Visit (INDEPENDENT_AMBULATORY_CARE_PROVIDER_SITE_OTHER): Payer: PPO | Admitting: Family Medicine

## 2018-07-09 ENCOUNTER — Ambulatory Visit (INDEPENDENT_AMBULATORY_CARE_PROVIDER_SITE_OTHER): Payer: PPO | Admitting: General Practice

## 2018-07-09 VITALS — BP 142/60 | HR 85 | Temp 97.5°F | Ht 66.5 in | Wt 179.5 lb

## 2018-07-09 DIAGNOSIS — E538 Deficiency of other specified B group vitamins: Secondary | ICD-10-CM | POA: Diagnosis not present

## 2018-07-09 DIAGNOSIS — N3001 Acute cystitis with hematuria: Secondary | ICD-10-CM | POA: Diagnosis not present

## 2018-07-09 DIAGNOSIS — Z7902 Long term (current) use of antithrombotics/antiplatelets: Secondary | ICD-10-CM

## 2018-07-09 DIAGNOSIS — Z7901 Long term (current) use of anticoagulants: Secondary | ICD-10-CM | POA: Diagnosis not present

## 2018-07-09 DIAGNOSIS — R300 Dysuria: Secondary | ICD-10-CM | POA: Diagnosis not present

## 2018-07-09 LAB — POCT URINALYSIS DIPSTICK
Bilirubin, UA: NEGATIVE
Glucose, UA: NEGATIVE
Ketones, UA: NEGATIVE
Nitrite, UA: NEGATIVE
Protein, UA: NEGATIVE
Spec Grav, UA: 1.015 (ref 1.010–1.025)
Urobilinogen, UA: 0.2 E.U./dL
pH, UA: 6 (ref 5.0–8.0)

## 2018-07-09 LAB — POCT INR: INR: 2.1 (ref 2.0–3.0)

## 2018-07-09 MED ORDER — CEPHALEXIN 500 MG PO CAPS: 500.0000 mg | ORAL_CAPSULE | Freq: Two times a day (BID) | ORAL | 0 refills | Status: AC

## 2018-07-09 MED ORDER — CYANOCOBALAMIN 1000 MCG/ML IJ SOLN
1000.0000 ug | Freq: Once | INTRAMUSCULAR | Status: AC
  Administered 2018-07-09: 1000 ug via INTRAMUSCULAR

## 2018-07-09 NOTE — Patient Instructions (Addendum)
Pre visit review using our clinic review tool, if applicable. No additional management support is needed unless otherwise documented below in the visit note.  Continue on  1 pill (2mg ) daily EXCEPT for 1/2 pill (1mg ) on Sunday/Tues/ Thursdays.  Recheck in 4 weeks.

## 2018-07-09 NOTE — Progress Notes (Signed)
Subjective:     [name] is a 85 y.o. female presenting for Urinary Urgency (x 2 weeks at least. Blood in urine, pressure in pelvic area, some burning with urination. nausea.)     HPI   #Urinary urgency - x 2 weeks - blood in urine - pressure in pelvic area - dysuria - at the start of urination - nausea - when she sits down feels pain in her bladder - no intermittent pain or severe pain - wears a pad - is on furosemide  - has noticed some hesitancy and difficulty getting a stream - does endorse some back pain   Review of Systems  Constitutional: Positive for chills. Negative for fever.  Gastrointestinal: Positive for nausea. Negative for abdominal pain and vomiting.  Genitourinary: Positive for dysuria, hematuria, pelvic pain and urgency.     Social History   Tobacco Use  Smoking Status Former Smoker  . Packs/day: 0.50  . Years: 40.00  . Pack years: 20.00  . Types: Cigarettes  . Last attempt to quit: 05/06/2001  . Years since quitting: 17.1  Smokeless Tobacco Never Used        Objective:    BP Readings from Last 3 Encounters:  07/09/18 (!) 142/60  06/22/18 138/64  06/12/18 (!) 142/64   Wt Readings from Last 3 Encounters:  07/09/18 179 lb 8 oz (81.4 kg)  06/22/18 179 lb 6.4 oz (81.4 kg)  06/12/18 181 lb 6.4 oz (82.3 kg)    BP (!) 142/60   Pulse 85   Temp (!) 97.5 F (36.4 C)   Ht 5' 6.5" (1.689 m)   Wt 179 lb 8 oz (81.4 kg)   BMI 28.54 kg/m    Physical Exam Constitutional:      General: She is not in acute distress.    Appearance: She is well-developed. She is not diaphoretic.  HENT:     Head: Normocephalic and atraumatic.  Eyes:     Conjunctiva/sclera: Conjunctivae normal.  Neck:     Musculoskeletal: Neck supple.  Cardiovascular:     Rate and Rhythm: Normal rate and regular rhythm.     Heart sounds: Normal heart sounds.  Pulmonary:     Effort: Pulmonary effort is normal.  Abdominal:     General: Bowel sounds are normal.  There is no distension.     Palpations: Abdomen is soft.     Tenderness: There is abdominal tenderness in the suprapubic area and left lower quadrant. There is no right CVA tenderness, left CVA tenderness or guarding.  Skin:    General: Skin is warm and dry.  Neurological:     Mental Status: She is alert.      UA: +LE, small blood     Assessment & Plan:   Problem List Items Addressed This Visit      Other   Dysuria   Relevant Orders   POCT urinalysis dipstick (Completed)   Urine Culture    Other Visit Diagnoses    Acute cystitis with hematuria    -  Primary   Relevant Medications   cephALEXin (KEFLEX) 500 MG capsule     Pt with most recent urine culture resistant to Cipro and with allergy to bactrim. Consider complicated UTI given nausea and back pain but no fever or CVA tenderness noted.   Will initiate treatment with abx that she tolerates and discuss warning symptoms for the ER or follow-up if no improvement  F/u UCx  Return if symptoms worsen or fail to  improve.  Lesleigh Noe, MD

## 2018-07-09 NOTE — Patient Instructions (Signed)
Take the antibiotic  If worsening symptoms - fevers, nausea/vomiting, worsening back pain, or no improvement > call the clinic or consider going to the ER.

## 2018-07-11 LAB — URINE CULTURE
MICRO NUMBER:: 281564
SPECIMEN QUALITY:: ADEQUATE

## 2018-07-13 ENCOUNTER — Telehealth: Payer: Self-pay | Admitting: Family Medicine

## 2018-07-13 DIAGNOSIS — N3001 Acute cystitis with hematuria: Secondary | ICD-10-CM

## 2018-07-13 MED ORDER — AMOXICILLIN-POT CLAVULANATE 500-125 MG PO TABS: 1.0000 | ORAL_TABLET | Freq: Two times a day (BID) | ORAL | 0 refills | Status: AC

## 2018-07-13 NOTE — Telephone Encounter (Signed)
-----   Message from [facility] sent at 07/13/2018 11:10 AM EDT ----- Patient advised. Patient states she is not much better with her symptoms, at the most 10% better. Still hurting in the pelvic/abdomen area. No fever that she knows of. Patient advised I would call back with further instructions after Dr. Einar Pheasant reviews. Pharmacy is [facility].

## 2018-07-13 NOTE — Telephone Encounter (Signed)
As not improved will send over Augmentin to take.   Recommend stopping Keflex and starting augmentin.   If not improving in next 2 days recommend follow-up with PCP

## 2018-07-13 NOTE — Telephone Encounter (Signed)
Patient advised.

## 2018-07-21 ENCOUNTER — Telehealth: Payer: Self-pay | Admitting: Family Medicine

## 2018-07-21 NOTE — Telephone Encounter (Signed)
Called patient about follow up. Cancelled appointment for tomorrow due to concerns for [place]. She is feeling better from UTI standpoint- hematuria resolved, urine less odorous, lower abdominal pain decreased. She will come to office to leave urine tomorrow.

## 2018-07-22 ENCOUNTER — Other Ambulatory Visit: Payer: Self-pay | Admitting: Family Medicine

## 2018-07-22 ENCOUNTER — Ambulatory Visit: Payer: PPO | Admitting: Family Medicine

## 2018-07-22 ENCOUNTER — Other Ambulatory Visit: Payer: Self-pay

## 2018-07-22 ENCOUNTER — Other Ambulatory Visit (INDEPENDENT_AMBULATORY_CARE_PROVIDER_SITE_OTHER): Payer: PPO

## 2018-07-22 ENCOUNTER — Telehealth: Payer: Self-pay

## 2018-07-22 DIAGNOSIS — R300 Dysuria: Secondary | ICD-10-CM

## 2018-07-22 DIAGNOSIS — R8290 Unspecified abnormal findings in urine: Secondary | ICD-10-CM | POA: Diagnosis not present

## 2018-07-22 DIAGNOSIS — N3090 Cystitis, unspecified without hematuria: Secondary | ICD-10-CM

## 2018-07-22 LAB — POC URINALSYSI DIPSTICK (AUTOMATED)
Bilirubin, UA: NEGATIVE
Glucose, UA: NEGATIVE
Ketones, UA: NEGATIVE
Nitrite, UA: NEGATIVE
Protein, UA: NEGATIVE
Spec Grav, UA: 1.01 (ref 1.010–1.025)
Urobilinogen, UA: 0.2 E.U./dL
pH, UA: 6.5 (ref 5.0–8.0)

## 2018-07-22 NOTE — Telephone Encounter (Signed)
Pt called given urine results, and informed that we will call her back once we get her culture results

## 2018-07-22 NOTE — Telephone Encounter (Signed)
-----   Message from Elby Beck, [place] sent at 07/22/2018 12:42 PM EDT ----- Please call patient and let her know that her urine showed a small number of white blood cells, and we are sending it off for culture to see if there is infection in the bladder.

## 2018-07-24 ENCOUNTER — Telehealth: Payer: Self-pay

## 2018-07-24 LAB — URINE CULTURE
MICRO NUMBER:: 332872
SPECIMEN QUALITY:: ADEQUATE

## 2018-07-24 MED ORDER — CEPHALEXIN 500 MG PO CAPS: 500.0000 mg | ORAL_CAPSULE | Freq: Two times a day (BID) | ORAL | 0 refills | Status: AC

## 2018-07-24 NOTE — Telephone Encounter (Signed)
Pt called no answer no voice mail picked up to leave a message

## 2018-07-24 NOTE — Progress Notes (Signed)
Discussed culture results with Dr. Damita Dunnings.

## 2018-07-27 ENCOUNTER — Other Ambulatory Visit: Payer: Self-pay

## 2018-07-27 ENCOUNTER — Telehealth: Payer: Self-pay

## 2018-07-27 ENCOUNTER — Ambulatory Visit (INDEPENDENT_AMBULATORY_CARE_PROVIDER_SITE_OTHER): Payer: PPO

## 2018-07-27 DIAGNOSIS — I5032 Chronic diastolic (congestive) heart failure: Secondary | ICD-10-CM

## 2018-07-27 DIAGNOSIS — Z95810 Presence of automatic (implantable) cardiac defibrillator: Secondary | ICD-10-CM

## 2018-07-27 NOTE — Telephone Encounter (Signed)
Pt called informed of lab results pt is going to go get her Antibiotic from the drug store, pt also had concern about a yeast infection spoke with Clarene Reamer FNP she stated that 3 day use of over the counter monistat should help with that, spoke with [facility] and she will call pt to set up appointment on Thursday to check pt INR due to ABT use and monistat to make sure that her levels are still in range, pt verbalized understanding and agrees with the plan of care

## 2018-07-28 NOTE — Progress Notes (Signed)
EPIC Encounter for ICM Monitoring  Patient Name: [name] is a 85 y.o. female Date: 07/28/2018 Primary Care Physican: Elby Beck, [place] Primary Cardiologist: Rockey Situ Electrophysiologist: Caryl Comes LastWeight:189lbs(183-185 lbs) 07/28/2018 Weight:172 lbs  Clinical Status (07-Jul-2018 to 27-Jul-2018)  Treated VT/VF 0 episodes   AT/AF 1 episode   Time in AT/AF 24.0 hr/day (100.0%)   Longest AT/AF 39 days  Observations (2) (07-Jul-2018 to 27-Jul-2018)  AT/AF >= 6 hr for 20 days.  Patient Activity less than 1 hr/day for 2 weeks.  Heart failure questions reviewed.  She stated she is feeling better but can still tell she is afib because she has a lot of fatigue and shortness of breath but it is not any worse than normal.  She continues to take antibiotics for reoccurring UTI.    Report: Thoracic impedance normal.  Fluid index < threshold for first time since December 2019.  Prescribed: Torsemide 20 mg Take 1 tablet (20 mg) by mouth once daily, take an extra tablet (20 mg) once daily as needed for weight > 187 lbs.  Klor Con 20 mEq Take 1 tablet (20 mEq total) by mouth 2 (two) times daily.                    Metolazone 2.5 mg Take 1 tablet (2.5 mg total) by mouth once a week. Labs: 06/12/2018 Creatinine 1.50, BUN 17, Potassium 3.5, Sodium 139, GFR 32-37 06/08/2018 Creatinine 1.40, BUN 21, Potassium 4.0, Sodium 138, GFR 35-40  06/04/2018 Creatinine 1.61, BUN 21, Potassium 4.1, Sodium 139, GFR 29-34  06/03/2018 Creatinine 1.53, BUN 19, Potassium 4.2, Sodium 137, GFR 31-36  06/02/2018 Creatinine1.47, BUN20, Potassium4.2, Sodium140, [id number] 06/01/2018 Creatinine1.51, BUN20, Potassium4.4, Sodium138, [id number]  05/19/2018 Creatinine1.43, BUN15, Potassium4.4, Sodium143, [id number]  A complete set of results can be found in Results Review.  Recommendations: No changes and encouraged to call for any fluid symptoms.   Follow-up plan: ICM clinic phone  appointment on4/27/2020.   Copy of ICM check sent to [place].   3 month ICM trend: 07/27/2018    AT/AF   1 Year ICM trend:       Rosalene Billings, RN 07/28/2018 2:36 PM

## 2018-07-29 NOTE — Telephone Encounter (Addendum)
Spoke with patient and set up INR check by curb side service for tomorrow 07/30/18.    I did speak with her about using estrogen cream.  She is using it once daily at night and would like a new r/x called in as she is almost out.    1. Can you send in new r/x for Premarin .625 to [place]?    Also, she wanted to let D. [name], NP know that she could not tolerate the Monistat, made burning and discomfort "100X worse".    2.  Her vaginal itching and burning is better but still bothersome.  She is wondering if she should or could try something else for relief?  Please Advise.

## 2018-07-30 ENCOUNTER — Ambulatory Visit (INDEPENDENT_AMBULATORY_CARE_PROVIDER_SITE_OTHER): Payer: PPO | Admitting: General Practice

## 2018-07-30 ENCOUNTER — Other Ambulatory Visit: Payer: PPO

## 2018-07-30 ENCOUNTER — Other Ambulatory Visit: Payer: Self-pay

## 2018-07-30 ENCOUNTER — Other Ambulatory Visit (INDEPENDENT_AMBULATORY_CARE_PROVIDER_SITE_OTHER): Payer: PPO

## 2018-07-30 DIAGNOSIS — I4819 Other persistent atrial fibrillation: Secondary | ICD-10-CM | POA: Diagnosis not present

## 2018-07-30 DIAGNOSIS — Z7901 Long term (current) use of anticoagulants: Secondary | ICD-10-CM | POA: Diagnosis not present

## 2018-07-30 LAB — POCT INR: INR: 1.7 — AB (ref 2.0–3.0)

## 2018-07-30 NOTE — Patient Instructions (Signed)
Pre visit review using our clinic review tool, if applicable. No additional management support is needed unless otherwise documented below in the visit note.  Take 1 tablet today (3/26) and then take   1 pill (2mg ) daily EXCEPT for 1/2 pill (1mg ) on Sunday/ Thursdays.  Recheck in 3 weeks.

## 2018-07-31 ENCOUNTER — Other Ambulatory Visit: Payer: Self-pay | Admitting: Family Medicine

## 2018-07-31 DIAGNOSIS — E2839 Other primary ovarian failure: Secondary | ICD-10-CM

## 2018-07-31 MED ORDER — ESTROGENS, CONJUGATED 0.625 MG/GM VA CREA: 1.0000 | TOPICAL_CREAM | Freq: Every day | VAGINAL | 12 refills | Status: DC

## 2018-07-31 NOTE — Telephone Encounter (Signed)
Called and spoke with patient. She reports vaginal itching and discharge decreased. Will send in new premarin to [place]. Improvement of urinary symptoms on antibiotic.

## 2018-08-03 ENCOUNTER — Telehealth: Payer: Self-pay | Admitting: Internal Medicine

## 2018-08-03 NOTE — Telephone Encounter (Signed)
Pt c/o BP issue: STAT if pt c/o blurred vision, one-sided weakness or slurred speech  1. What are your last 5 BP readings?  Yesterday 111/47 This morning 130/63 Now 111/49  2. Are you having any other symptoms (ex. Dizziness, headache, blurred vision, passed out)?  Some dizziness, but she states she has this all the time 3. What is your BP issue?  Too low

## 2018-08-05 NOTE — Telephone Encounter (Addendum)
I spoke with the patient regarding her BP readings. She states that Sunday and Monday her readings were low 100s/40's. Today she was 129/59. She has felt a little more dizzy than normal.   Confirmed she is taking: 1) Coreg 12.5 mg- 1/2 tablet (6.25 mg) twice daily  2) Spironolactone 25 mg- 1 tablet daily  3) Metolazone 2.5 mg- once a week (the last 2 weeks she took this on a Friday, but will typically try to take mid week)  4) torsemide 20 mg once daliy except the days she take metolazone so will take torsemide 40 mg daily  She feels like she is staying hydrated. She has been dealing with a UTI for at least 2 weeks and is on her 3rd antibiotic.   Her current weight is 171 lbs.  Down from 188 lbs on 05/22/2018.  I advised the patient I will forward to Dr. Caryl Comes to review and advise of any recommendations.   She voices understanding and is agreeable.

## 2018-08-06 ENCOUNTER — Ambulatory Visit: Payer: PPO

## 2018-08-06 MED ORDER — SPIRONOLACTONE 25 MG PO TABS: 12.5000 mg | ORAL_TABLET | Freq: Every day | ORAL | Status: DC

## 2018-08-06 NOTE — Telephone Encounter (Signed)
I spoke with the patient. She is aware of Dr. Olin Pia recommendations to: - decrease spironolactone 25 mg- to 1/2 tabetl (12.5 mg) by mouth once daily.  The patient voices understanding and is agreeable.

## 2018-08-06 NOTE — Telephone Encounter (Signed)
Lets try and decrease aldactone to 12.5 plz  Thanks

## 2018-08-18 ENCOUNTER — Telehealth: Payer: Self-pay | Admitting: Family Medicine

## 2018-08-18 NOTE — Telephone Encounter (Signed)
Best number [phone]   Pt called she has labs tomorrow afternoon @ 3:30 for inr and wanted to know if she could drop off urine sample she thinks she is getting a UTI.  She is having back pain/burning when going to bathroom discharge/ she has seen blood in urine   She is willing to do phone visit if you need her to. She doesn't want to come in office and no smart or internet.

## 2018-08-19 ENCOUNTER — Other Ambulatory Visit (INDEPENDENT_AMBULATORY_CARE_PROVIDER_SITE_OTHER): Payer: PPO

## 2018-08-19 ENCOUNTER — Other Ambulatory Visit: Payer: Self-pay | Admitting: Family Medicine

## 2018-08-19 ENCOUNTER — Other Ambulatory Visit: Payer: Self-pay

## 2018-08-19 ENCOUNTER — Ambulatory Visit: Payer: PPO | Admitting: Family Medicine

## 2018-08-19 ENCOUNTER — Ambulatory Visit (INDEPENDENT_AMBULATORY_CARE_PROVIDER_SITE_OTHER): Payer: PPO

## 2018-08-19 DIAGNOSIS — Z7902 Long term (current) use of antithrombotics/antiplatelets: Secondary | ICD-10-CM

## 2018-08-19 DIAGNOSIS — Z7901 Long term (current) use of anticoagulants: Secondary | ICD-10-CM | POA: Diagnosis not present

## 2018-08-19 DIAGNOSIS — I5022 Chronic systolic (congestive) heart failure: Secondary | ICD-10-CM | POA: Diagnosis not present

## 2018-08-19 DIAGNOSIS — R300 Dysuria: Secondary | ICD-10-CM

## 2018-08-19 DIAGNOSIS — N390 Urinary tract infection, site not specified: Secondary | ICD-10-CM | POA: Diagnosis not present

## 2018-08-19 DIAGNOSIS — I420 Dilated cardiomyopathy: Secondary | ICD-10-CM | POA: Diagnosis not present

## 2018-08-19 DIAGNOSIS — I340 Nonrheumatic mitral (valve) insufficiency: Secondary | ICD-10-CM | POA: Diagnosis not present

## 2018-08-19 LAB — POCT INR
INR: 1.8 — AB (ref 2.0–3.0)
INR: 1.8 — AB (ref 2.0–3.0)

## 2018-08-19 NOTE — Telephone Encounter (Signed)
I will not be here this afternoon, can you make sure she does this? Thanks!

## 2018-08-19 NOTE — Patient Instructions (Signed)
INR today 1.8  *Patient confirmed correct dosing from last coag encounter but still is subtherapeutic. Take 1.5 pills (3mg )  tablet today (4/15)d then take 1 pill (2mg ) daily EXCEPT for 1/2 pill (1mg ) on Sundays only and Recheck in 2 weeks.  Patient denies any changes to diet, health or medications. She understands risks associated with subtherapeutic level and will go to ER if any concerns develop.

## 2018-08-19 NOTE — Telephone Encounter (Signed)
Yes, please have her bring a fresh urine sample. I will put in order for UA.

## 2018-08-20 ENCOUNTER — Other Ambulatory Visit: Payer: PPO

## 2018-08-20 DIAGNOSIS — N390 Urinary tract infection, site not specified: Secondary | ICD-10-CM | POA: Diagnosis not present

## 2018-08-20 DIAGNOSIS — R300 Dysuria: Secondary | ICD-10-CM | POA: Diagnosis not present

## 2018-08-20 LAB — POC URINALSYSI DIPSTICK (AUTOMATED)
Bilirubin, UA: NEGATIVE
Blood, UA: NEGATIVE
Glucose, UA: NEGATIVE
Ketones, UA: NEGATIVE
Leukocytes, UA: NEGATIVE
Nitrite, UA: NEGATIVE
Protein, UA: NEGATIVE
Spec Grav, UA: 1.015 (ref 1.010–1.025)
Urobilinogen, UA: NEGATIVE E.U./dL — AB
pH, UA: 6 (ref 5.0–8.0)

## 2018-08-20 NOTE — Addendum Note (Signed)
Addended by: Ellamae Sia on: 08/20/2018 04:06 PM   Modules accepted: Orders

## 2018-08-21 LAB — URINE CULTURE
MICRO NUMBER:: 400522
SPECIMEN QUALITY:: ADEQUATE

## 2018-08-24 NOTE — Progress Notes (Signed)
I have reviewed this visit and I agree on the patient's plan of dosage and recommendations. Deborah B Gessner, FNP   

## 2018-08-25 ENCOUNTER — Telehealth: Payer: Self-pay

## 2018-08-25 MED ORDER — METOLAZONE 5 MG PO TABS: ORAL_TABLET | ORAL | 3 refills | Status: DC

## 2018-08-25 MED ORDER — TORSEMIDE 20 MG PO TABS: ORAL_TABLET | ORAL | 3 refills | Status: DC

## 2018-08-25 NOTE — Telephone Encounter (Signed)
Spoke with patient and provided Dr Olin Pia recommendations.  1. Stay on torsemide 40 and use zaroxlyn 5 mg once a week 2. Advised that the next option for her Afib would be a visit to [facility] to initiate sotalol but this will have to wait for post COVID and she agreed.    She asked if she should take another Metolazone 2.5 mg tomorrow since Dr Caryl Comes increased the dosage to 5 mg.   Prescriptions sent for Torsemide and Metolazone.  She said she usually takes Potassium when she takes Metolazone. Patient declined a telehealth visit at this time.     Dr Caryl Comes, patient said her legs are very swollen.  1. Can she take an additional 2.5 mg Zaroxlyn tablet this week since the dosage is increased to 5 mg?  The dosage she took today (08/25/2018) was 2.5 mg.   2. Do you want to add additional Potassium each time she takes Metolazone?  If so how much?  Optivol Report accessible for review.

## 2018-08-25 NOTE — Telephone Encounter (Signed)
Returned call to patient as requested.  She is symptomatic for fluid with weight gain of 5 lbs since Friday 4/17.  Her weight after taking Metolazone last Thursday was 171 lbs and today it is 176 lbs.  She is chronically very Lauriel Helin of breath which is audible over the phone and significant swelling in legs and feet but feels like the swelling is worse in the last month.   She took Metolazone 2.5 mg today followed by 20 mg of Torsemide.  She has doubled the Torsemide to 40 mg a few days in the last week but not today. She has good urine output from Metolazone. Patient sent [employer] remote transmission for review of fluid levels but [employer]'s website is currently down.   She asked if Dr Caryl Comes would be prescribing anything else for Afib.  She says he has tried her on several different meds but unable to tolerate them.   Advised will ask Dr Caryl Comes for recommendations and call her back.

## 2018-08-25 NOTE — Telephone Encounter (Signed)
No great answers, L  1) would stay on torsemide 40 and use zaroxlyn 5 mg once a week 2) At this point the next option for her Afib would be a visit to [facility] to initiate sotalol but this will have to wait for post COVID  thx  S  H let me know if youthink we should do Telehealth visit

## 2018-08-25 NOTE — Telephone Encounter (Signed)
[name], if you call her and you think she needs a visit- I trust you!  Let me know and I will set her up.  Dr. Caryl Comes, will they do sotalol at [facility]?

## 2018-08-26 NOTE — Telephone Encounter (Signed)
Spoke with patient.  Advised to take the Torsemide 40 mg this morning as ordered by Dr Caryl Comes and call back with a recommendation is made about taking a 2nd 2.5 mg dose of Metolazone this week.  She reports her leg swelling slightly and breathing when lying down last night slightly improved overnight.  She has not weighed yet this morning.

## 2018-08-26 NOTE — Telephone Encounter (Signed)
Call to patient.  Advised Dr Caryl Comes recommended to continue with Torsemide 40 mg.  I will call her Friday 4/24 to check how she is doing and then will discuss with Dr Caryl Comes to determine if another dose of Metolazone will be needed.  She gets out of bed around 9 AM.

## 2018-08-26 NOTE — Telephone Encounter (Signed)
Noted  Lets check in with her on Fri SVP and see how she is doing, as she may benefit from one more boost of diuretic

## 2018-08-26 NOTE — Telephone Encounter (Signed)
Clarification by Dr Caryl Comes.  Call patient Friday, 4/24 and will determine at that time if another Metolazone will be needed.

## 2018-08-28 NOTE — Telephone Encounter (Signed)
Call to patient.  She reports feeling better this morning. Symptoms are improving.   1. Weight is 172 lbs today which is a decrease from 177.4 lbs on 4/22.   2. Lower extremity swelling significantly improved but not completely resolved.  Swelling is worse by the end of the day but has decreased when elevated. Decrease in ShOB and was not heard on the phone compared to earlier in the week.    She continues taking Torsemide 40 mg daily as prescribed and will start the Metolazone 5 mg weekly dosage next week.  Last Metolazone dosage was 2.5 mg taken on 4/21.   Will recheck fluid levels on 08/31/2018.    Advised will send update to Dr Caryl Comes if he has any new recommendations will call her back.

## 2018-08-31 ENCOUNTER — Ambulatory Visit (INDEPENDENT_AMBULATORY_CARE_PROVIDER_SITE_OTHER): Payer: PPO

## 2018-08-31 ENCOUNTER — Other Ambulatory Visit: Payer: Self-pay

## 2018-08-31 DIAGNOSIS — Z95810 Presence of automatic (implantable) cardiac defibrillator: Secondary | ICD-10-CM | POA: Diagnosis not present

## 2018-08-31 DIAGNOSIS — I5032 Chronic diastolic (congestive) heart failure: Secondary | ICD-10-CM

## 2018-08-31 NOTE — Telephone Encounter (Signed)
Continue current co urse   thanks

## 2018-09-01 NOTE — Progress Notes (Signed)
EPIC Encounter for ICM Monitoring  Patient Name: [name] is a 85 y.o. female Date: 09/01/2018 Primary Care Physican: Elby Beck, [place] Primary Cardiologist: Rockey Situ Electrophysiologist: Caryl Comes 07/28/2018 Weight:172 lbs 09/02/2018 Weight: 173.2 lbs  Clinical Status (25-Aug-2018 to 31-Aug-2018)  AT/AF 1 episode  Time in AT/AF 24.0 hr/day (100.0%)  Longest AT/AF 74 days  Observations (1) (25-Aug-2018 to 31-Aug-2018)  AT/AF >= 6 hr for 6 days.  Heart failure questions reviewed and pt reports both legs/feet remain swollen with right leg more swollen making it painful to walk.   She said her breathing is back to baseline.    Report: Thoracic impedancereturned to normal since increasing Metolazone to 5 mg a week.  Prescribed:Torsemide 20 mgTake 1 tablet (20 mg) by mouth once daily, take an extra tablet (20 mg) once daily as needed for weight > 187 lbs.  Klor Con 20 mEq Take 1 tablet (20 mEq total) by mouth 2 (two) times daily. Metolazone 5 mgTake 1 tablet (5 mg total) by mouth once a week.  Labs: 06/12/2018 Creatinine1.50, BUN17, Potassium3.5, Sodium139, [id number] 06/08/2018 Creatinine1.40, BUN21, Potassium4.0, [id number], [id number]  06/04/2018 Creatinine1.61, BUN21, Potassium4.1, Sodium139, [id number]  06/03/2018 Creatinine1.53, BUN19, Potassium4.2, [id number], [id number] 06/02/2018 Creatinine1.47, BUN20, Potassium4.2, Sodium140, [id number] 06/01/2018 Creatinine1.51, BUN20, Potassium4.4, Sodium138, [id number]  05/19/2018 Creatinine1.43, BUN15, Potassium4.4, [id number], [id number]  A complete set of results can be found in Results Review.  Recommendations:Advised unsure of the reason her legs are swollen. Explained vascular insufficiency can also cause leg swelling but not sure if this is the cause of her leg swelling. Encouraged to call if condition worsens.   Follow-up plan: ICM clinic phone appointment on6/05/2018.   Copy of  ICM check sent to [place].  3 month ICM trend: 08/31/2018    AT/AF    1 Year ICM trend:       Rosalene Billings, RN 09/01/2018 12:43 PM

## 2018-09-03 ENCOUNTER — Other Ambulatory Visit: Payer: PPO

## 2018-09-03 ENCOUNTER — Other Ambulatory Visit: Payer: Self-pay

## 2018-09-03 NOTE — Progress Notes (Signed)
If her breathing is stable-- her optivol looks great  Lets continue current diruetics and addrsss possible venous insufficiency

## 2018-09-03 NOTE — Progress Notes (Signed)
I spoke with the patient. Her breathing and weight are stable. She is able to talk on the phone with me today for the first time in a while without getting winded.  I have discussed venous insufficiency with her and the role this can play with swelling in her legs. I have advised that it sounds like her CHF is settled for now, but that venous insufficiency is causing the residual swelling that she is having.  She states her swelling does improve over night although not totally gone in the morning.  She does not have compression socks at home.  I have advised her to please obtain some OTC knee high compression socks and see if this will help. She is aware that she does not have to wear these during the night, just during her waking hours.  She also mentions as an aside today that she is having trouble going to sleep at night and then she falls asleep during the day.  I have asked her to follow up with her PCP about this.  The patient voices understanding of the above and is agreeable.

## 2018-09-08 ENCOUNTER — Encounter: Payer: Self-pay | Admitting: Family Medicine

## 2018-09-08 ENCOUNTER — Other Ambulatory Visit (INDEPENDENT_AMBULATORY_CARE_PROVIDER_SITE_OTHER): Payer: PPO

## 2018-09-08 ENCOUNTER — Ambulatory Visit (INDEPENDENT_AMBULATORY_CARE_PROVIDER_SITE_OTHER): Payer: PPO | Admitting: Family Medicine

## 2018-09-08 ENCOUNTER — Ambulatory Visit: Payer: Self-pay

## 2018-09-08 VITALS — BP 128/59 | HR 69 | Temp 97.5°F | Ht 66.5 in | Wt 174.4 lb

## 2018-09-08 DIAGNOSIS — Z7902 Long term (current) use of antithrombotics/antiplatelets: Secondary | ICD-10-CM

## 2018-09-08 DIAGNOSIS — N390 Urinary tract infection, site not specified: Secondary | ICD-10-CM

## 2018-09-08 DIAGNOSIS — N184 Chronic kidney disease, stage 4 (severe): Secondary | ICD-10-CM

## 2018-09-08 DIAGNOSIS — Z7901 Long term (current) use of anticoagulants: Secondary | ICD-10-CM | POA: Diagnosis not present

## 2018-09-08 DIAGNOSIS — D631 Anemia in chronic kidney disease: Secondary | ICD-10-CM

## 2018-09-08 DIAGNOSIS — I4891 Unspecified atrial fibrillation: Secondary | ICD-10-CM

## 2018-09-08 DIAGNOSIS — R300 Dysuria: Secondary | ICD-10-CM

## 2018-09-08 LAB — POC URINALSYSI DIPSTICK (AUTOMATED)
Bilirubin, UA: NEGATIVE
Glucose, UA: NEGATIVE
Ketones, UA: NEGATIVE
Nitrite, UA: NEGATIVE
Protein, UA: POSITIVE — AB
Spec Grav, UA: 1.02 (ref 1.010–1.025)
Urobilinogen, UA: 0.2 E.U./dL
pH, UA: 5.5 (ref 5.0–8.0)

## 2018-09-08 LAB — POCT INR: INR: 2.1 (ref 2.0–3.0)

## 2018-09-08 MED ORDER — CEPHALEXIN 500 MG PO CAPS: 500.0000 mg | ORAL_CAPSULE | Freq: Two times a day (BID) | ORAL | 0 refills | Status: DC

## 2018-09-08 NOTE — Progress Notes (Signed)
Virtual visit completed through D[employer]Me. Due to national recommendations of social distancing due to [place] 19, a virtual visit is felt to be most appropriate for this patient at this time. Interactive audio and video telecommunications were attempted between myself and [name], however failed as patient did not have access to video capability. We continued and completed visit with audio only.   Patient location: home Provider location: [place] at [facility], office If any vitals were documented, they were collected by patient at home unless specified below.    BP (!) 128/59 (BP Location: Left Arm, Patient Position: Sitting)   Pulse 69   Temp (!) 97.5 F (36.4 C) (Oral)   Ht 5' 6.5" (1.689 m)   Wt 174 lb 6 oz (79.1 kg)   BMI 27.72 kg/m    CC: lower back pain Subjective:    Patient ID: [name], female    DOB: [birth year], 85 y.o.   MRN: [id number]  HPI: [name] is a 85 y.o. female presenting on 09/08/2018 for Dysuria (C/o pain with urination, low back pain, fatigue and sees blood on tissue. )   1 week h/o lower back pain, dysuria, fatigue, nausea. Saw blood on tissue with wiping. + urinary frequency and urgency.   No fevers, flank pain or lower abd pain.   Last UTI 07/2018 - treated with 10d keflex course.   H/o recurrent UTI - sees urology Erlene Quan) - has not seen recently. She has atrophic vaginitis regularly on estrace cream. Also uses cranberry pills. Prior tmp for ppx not tolerated (itching). H/o R nephrectomy for renal cancer.   On coumadin for h/o afib.      Relevant past medical, surgical, family and social history reviewed and updated as indicated. Interim medical history since our last visit reviewed. Allergies and medications reviewed and updated. Outpatient Medications Prior to Visit  Medication Sig Dispense Refill  . acetaminophen (TYLENOL) 500 MG tablet Take 1,000 mg by mouth daily as needed for headache.     . carvedilol (COREG) 12.5  MG tablet Take 1/2 tablet (6.25 mg) once daily in the morning 180 tablet 3  . cetirizine (ZYRTEC) 10 MG tablet Take 10 mg by mouth daily.     Marland Kitchen conjugated estrogens (PREMARIN) vaginal cream Place 1 Applicatorful vaginally daily. 42.5 g 12  . CRANBERRY PO Take 4 tablets by mouth daily.    Marland Kitchen ezetimibe (ZETIA) 10 MG tablet Take 1 tablet (10 mg total) by mouth daily. 30 tablet 11  . fluticasone (FLONASE) 50 MCG/ACT nasal spray Place 2 sprays into both nostrils daily. 16 g 6  . hydrocortisone 2.5 % cream Apply 1 application topically daily as needed (itching).     . magnesium oxide (MAG-OX) 400 MG tablet Take 1 tablet (400 mg total) by mouth daily. 30 tablet 2  . metolazone (ZAROXOLYN) 5 MG tablet Take 1 tablet (5 mg total) once a week. 12 tablet 3  . potassium chloride SA (K-DUR,KLOR-CON) 20 MEQ tablet Take 1 tablet (20 mEq total) by mouth 2 (two) times daily. 60 tablet 3  . rosuvastatin (CRESTOR) 40 MG tablet Take 1 tablet (40 mg total) by mouth daily. 90 tablet 3  . spironolactone (ALDACTONE) 25 MG tablet Take 0.5 tablets (12.5 mg total) by mouth daily.    Marland Kitchen torsemide (DEMADEX) 20 MG tablet Take 2 tablets (40 mg) by mouth once daily. 180 tablet 3  . warfarin (COUMADIN) 2 MG tablet Take 1 1/2 tablets daily or AS DIRECTED BY ANTICOAGULATION  CLINIC. 130 tablet 1  . cephALEXin (KEFLEX) 500 MG capsule Take 1 capsule (500 mg total) by mouth 2 (two) times daily. 14 capsule 0   No facility-administered medications prior to visit.      Per HPI unless specifically indicated in ROS section below Review of Systems Objective:    BP (!) 128/59 (BP Location: Left Arm, Patient Position: Sitting)   Pulse 69   Temp (!) 97.5 F (36.4 C) (Oral)   Ht 5' 6.5" (1.689 m)   Wt 174 lb 6 oz (79.1 kg)   BMI 27.72 kg/m   Wt Readings from Last 3 Encounters:  09/08/18 174 lb 6 oz (79.1 kg)  07/09/18 179 lb 8 oz (81.4 kg)  06/22/18 179 lb 6.4 oz (81.4 kg)     Physical exam: Gen: alert, NAD, not ill appearing  Pulm: speaks in complete sentences without increased work of breathing Psych: normal mood, normal thought content      Results for orders placed or performed in visit on 09/08/18  POCT Urinalysis Dipstick (Automated)  Result Value Ref Range   Color, UA yellow    Clarity, UA cloudy    Glucose, UA Negative Negative   Bilirubin, UA negative    Ketones, UA negative    Spec Grav, UA 1.020 1.010 - 1.025   Blood, UA 3+    pH, UA 5.5 5.0 - 8.0   Protein, UA Positive (A) Negative   Urobilinogen, UA 0.2 0.2 or 1.0 E.U./dL   Nitrite, UA negative    Leukocytes, UA Large (3+) (A) Negative   *Note: Due to a large number of results and/or encounters for the requested time period, some results have not been displayed. A complete set of results can be found in Results Review.   Lab Results  Component Value Date   CREATININE 1.50 (H) 06/12/2018   BUN 17 06/12/2018   NA 139 06/12/2018   K 3.5 06/12/2018   CL 100 06/12/2018   CO2 29 06/12/2018    Assessment & Plan:   Problem List Items Addressed This Visit    Recurrent UTI - Primary    Recurrent with WBC and RBC on micro. Will send culture and start keflex (last culture grew E coli sensitive to keflex). Continue preventative strategies as up to now. Pt agrees with plan.       Relevant Medications   cephALEXin (KEFLEX) 500 MG capsule   Dysuria   Relevant Orders   POCT Urinalysis Dipstick (Automated) (Completed)   Urine Culture       Meds ordered this encounter  Medications  . DISCONTD: cephALEXin (KEFLEX) 500 MG capsule    Sig: Take 1 capsule (500 mg total) by mouth 2 (two) times daily.    Dispense:  14 capsule    Refill:  0   Orders Placed This Encounter  Procedures  . Urine Culture  . POCT Urinalysis Dipstick (Automated)    Follow up plan: No follow-ups on file.  [name] Bush, MD

## 2018-09-08 NOTE — Assessment & Plan Note (Signed)
Recurrent with WBC and RBC on micro. Will send culture and start keflex (last culture grew E coli sensitive to keflex). Continue preventative strategies as up to now. Pt agrees with plan.

## 2018-09-08 NOTE — Patient Instructions (Signed)
INR today 2.1  Continue taking 1 pill (2mg ) daily EXCEPT for 1/2 pill (1mg ) on Sundays only and Recheck in 2 weeks.  Patient is starting today on Keflex for UTI.  Keflex typically does not interact with the warfarin. Will keep her on same dose but if she has any changes to her medication following culture report, she is to call me back to discuss potential changes if needed.  Patient verbalizes understanding.

## 2018-09-10 LAB — URINE CULTURE
MICRO NUMBER:: 446428
SPECIMEN QUALITY:: ADEQUATE

## 2018-09-10 NOTE — Progress Notes (Signed)
I have reviewed this visit and I agree on the patient's plan of dosage and recommendations. Deborah B Gessner, FNP   

## 2018-09-11 ENCOUNTER — Telehealth: Payer: Self-pay

## 2018-09-11 ENCOUNTER — Telehealth: Payer: Self-pay | Admitting: Family Medicine

## 2018-09-11 NOTE — Telephone Encounter (Signed)
Virtual Visit Pre-Appointment Phone Call Confirm consent - "In the setting of the current Covid19 crisis, you are scheduled for a (phone or video) visit with your provider on (date) at (time).  Just as we do with many in-office visits, in order for you to participate in this visit, we must obtain consent.  If you'd like, I can send this to your mychart (if signed up) or email for you to review.  Otherwise, I can obtain your verbal consent now.  All virtual visits are billed to your insurance company just like a normal visit would be.  By agreeing to a virtual visit, we'd like you to understand that the technology does not allow for your provider to perform an examination, and thus may limit your provider's ability to fully assess your condition. If your provider identifies any concerns that need to be evaluated in person, we will make arrangements to do so.  Finally, though the technology is pretty good, we cannot assure that it will always work on either your or our end, and in the setting of a video visit, we may have to convert it to a phone-only visit.  In either situation, we cannot ensure that we have a secure connection.  Are you willing to proceed?" YES  Advise patient to be prepared - "Two hours prior to your appointment, go ahead and check your blood pressure, pulse, oxygen saturation, and your weight (if you have the equipment to check those) and write them all down. When your visit starts, your provider will ask you for this information. If you have an Apple Watch or Kardia device, please plan to have heart rate information ready on the day of your appointment. Please have a pen and paper handy nearby the day of the visit as well."  Inform patient they will receive a phone call 15 minutes prior to their appointment time (may be from unknown caller ID) so they should be prepared to answer    TELEPHONE CALL NOTE  [name] has been deemed a candidate for a follow-up tele-health  visit to limit community exposure during the Covid-19 pandemic. I spoke with the patient via phone to ensure availability of phone/video source, confirm preferred email & phone number, and discuss instructions and expectations.  I reminded [name] to be prepared with any vital sign and/or heart rhythm information that could potentially be obtained via home monitoring, at the time of her visit. I reminded [name] to expect a phone call prior to her visit.  Alba Destine, RMA 09/11/2018 11:51 AM  IF USING DOXIMITY or [email or web address] - The patient will receive a link just prior to their visit by text.     FULL LENGTH CONSENT FOR TELE-HEALTH VISIT   I hereby voluntarily request, consent and authorize [facility] and its employed or contracted physicians, physician assistants, nurse practitioners or other licensed health care professionals (the Practitioner), to provide me with telemedicine health care services (the Services") as deemed necessary by the treating Practitioner. I acknowledge and consent to receive the Services by the Practitioner via telemedicine. I understand that the telemedicine visit will involve communicating with the Practitioner through live audiovisual communication technology and the disclosure of certain medical information by electronic transmission. I acknowledge that I have been given the opportunity to request an in-person assessment or other available alternative prior to the telemedicine visit and am voluntarily participating in the telemedicine visit.  I understand that I have the right to withhold  or withdraw my consent to the use of telemedicine in the course of my care at any time, without affecting my right to future care or treatment, and that the Practitioner or I may terminate the telemedicine visit at any time. I understand that I have the right to inspect all information obtained and/or recorded in the course of the telemedicine visit and may  receive copies of available information for a reasonable fee.  I understand that some of the potential risks of receiving the Services via telemedicine include:   Delay or interruption in medical evaluation due to technological equipment failure or disruption;  Information transmitted may not be sufficient (e.g. poor resolution of images) to allow for appropriate medical decision making by the Practitioner; and/or   In rare instances, security protocols could fail, causing a breach of personal health information.  Furthermore, I acknowledge that it is my responsibility to provide information about my medical history, conditions and care that is complete and accurate to the best of my ability. I acknowledge that Practitioner's advice, recommendations, and/or decision may be based on factors not within their control, such as incomplete or inaccurate data provided by me or distortions of diagnostic images or specimens that may result from electronic transmissions. I understand that the practice of medicine is not an exact science and that Practitioner makes no warranties or guarantees regarding treatment outcomes. I acknowledge that I will receive a copy of this consent concurrently upon execution via email to the email address I last provided but may also request a printed copy by calling the office of [facility].    I understand that my insurance will be billed for this visit.   I have read or had this consent read to me.  I understand the contents of this consent, which adequately explains the benefits and risks of the Services being provided via telemedicine.   I have been provided ample opportunity to ask questions regarding this consent and the Services and have had my questions answered to my satisfaction.  I give my informed consent for the services to be provided through the use of telemedicine in my medical care  By participating in this telemedicine visit I agree to the above.

## 2018-09-11 NOTE — Telephone Encounter (Signed)
Spoke with pt informing her we are still waiting on results. Pt verbalizes understanding.

## 2018-09-11 NOTE — Telephone Encounter (Signed)
Best number [phone] Pt called checking results  on culture for urine

## 2018-09-16 ENCOUNTER — Telehealth: Payer: Self-pay | Admitting: Internal Medicine

## 2018-09-16 NOTE — Telephone Encounter (Signed)
Pt c/o swelling: STAT is pt has developed SOB within 24 hours  1) How much weight have you gained and in what time span? Last checked was 171, has gone down   2) If swelling, where is the swelling located? Right above knees, down the leg  3) Are you currently taking a fluid pill? Yes  4) Are you currently SOB? Yes, but that is all the time   5) Do you have a log of your daily weights (if so, list)? no  6) Have you gained 3 pounds in a day or 5 pounds in a week? no  7) Have you traveled recently? no  Patient would like to know if she should be seen sooner than her 5/19 appointment.  Please call. Will be unavailable tomorrow from 2pm to 3pm

## 2018-09-17 NOTE — Telephone Encounter (Signed)
I spoke with the patient this morning.  She complains of bilateral lower extremity edema that has been ongoing, but slightly worse since Sunday.  She is now having small pockets of fluid/ blisters on her legs below the knee.   Her weight has been stable at ~ 171 lbs.  She states she took a metolazone 5 mg x 1 dose this morning to see if this would help with the swelling.  Her SOB is no worse. She does report on Sunday 5/10, she felt very weak and her BP's were reported as 114/45, 112/47, 103/50 (all sitting) 115/46 (standing) Most days she is ~ 142/67  I advised the patient, if her weights are stable, then per our previous discussion on 08/31/18 (see ICM note), that her swelling is most likely due to venous insufficiency.  She did try wearing support socks, but states her swelling worsened and her blisters popped up so she can't get them on. She confirms she is not elevating her legs when sitting.  She is supposed to be getting a new lift char/ recliner in the next day or so.    I have advised the patient: 1) she must elevate her legs when she is sitting, even on the couch, she should place pillows under her legs- this will help her most at this time 2) avoid salt 3) do not take any more metolazone until her appt on 09/22/18 with Dr. Caryl Comes, if her weight is stable. 4) if her swelling goes down in her legs and sores improve, she should try wearing the support hose again.  The patient voices understanding of the above and is agreeable.

## 2018-09-21 ENCOUNTER — Other Ambulatory Visit: Payer: Self-pay

## 2018-09-21 ENCOUNTER — Ambulatory Visit (INDEPENDENT_AMBULATORY_CARE_PROVIDER_SITE_OTHER): Payer: PPO | Admitting: *Deleted

## 2018-09-21 DIAGNOSIS — I428 Other cardiomyopathies: Secondary | ICD-10-CM | POA: Diagnosis not present

## 2018-09-22 ENCOUNTER — Other Ambulatory Visit: Payer: Self-pay

## 2018-09-22 ENCOUNTER — Telehealth (INDEPENDENT_AMBULATORY_CARE_PROVIDER_SITE_OTHER): Payer: PPO | Admitting: Internal Medicine

## 2018-09-22 ENCOUNTER — Telehealth: Payer: Self-pay | Admitting: Internal Medicine

## 2018-09-22 VITALS — BP 147/66 | HR 65 | Temp 97.3°F | Ht 66.5 in | Wt 174.6 lb

## 2018-09-22 DIAGNOSIS — Z95810 Presence of automatic (implantable) cardiac defibrillator: Secondary | ICD-10-CM

## 2018-09-22 DIAGNOSIS — D649 Anemia, unspecified: Secondary | ICD-10-CM

## 2018-09-22 DIAGNOSIS — I5032 Chronic diastolic (congestive) heart failure: Secondary | ICD-10-CM

## 2018-09-22 DIAGNOSIS — R0602 Shortness of breath: Secondary | ICD-10-CM

## 2018-09-22 DIAGNOSIS — Z79899 Other long term (current) drug therapy: Secondary | ICD-10-CM

## 2018-09-22 DIAGNOSIS — E876 Hypokalemia: Secondary | ICD-10-CM

## 2018-09-22 DIAGNOSIS — I4819 Other persistent atrial fibrillation: Secondary | ICD-10-CM

## 2018-09-22 DIAGNOSIS — N184 Chronic kidney disease, stage 4 (severe): Secondary | ICD-10-CM

## 2018-09-22 LAB — CUP PACEART REMOTE DEVICE CHECK
Battery Remaining Longevity: 53 mo
Battery Voltage: 2.97 V
Brady Statistic AP VP Percent: 1.68 %
Brady Statistic AP VS Percent: 0.07 %
Brady Statistic AS VP Percent: 71.77 %
Brady Statistic AS VS Percent: 26.48 %
Brady Statistic RA Percent Paced: 1.12 %
Brady Statistic RV Percent Paced: 73.94 %
Date Time Interrogation Session: 20200518062504
HighPow Impedance: 36 Ohm
HighPow Impedance: 48 Ohm
Implantable Lead Implant Date: 20061026
Implantable Lead Implant Date: 20111230
Implantable Lead Location: 753859
Implantable Lead Location: 753860
Implantable Lead Model: 5076
Implantable Lead Model: 6947
Implantable Pulse Generator Implant Date: 20171018
Lead Channel Impedance Value: 342 Ohm
Lead Channel Impedance Value: 456 Ohm
Lead Channel Impedance Value: 456 Ohm
Lead Channel Pacing Threshold Amplitude: 0.625 V
Lead Channel Pacing Threshold Pulse Width: 0.4 ms
Lead Channel Sensing Intrinsic Amplitude: 0.125 mV
Lead Channel Sensing Intrinsic Amplitude: 0.125 mV
Lead Channel Sensing Intrinsic Amplitude: 9.5 mV
Lead Channel Sensing Intrinsic Amplitude: 9.5 mV
Lead Channel Setting Pacing Amplitude: 2.5 V
Lead Channel Setting Pacing Amplitude: 2.5 V
Lead Channel Setting Pacing Pulse Width: 0.4 ms
Lead Channel Setting Sensing Sensitivity: 0.3 mV

## 2018-09-22 MED ORDER — LEVOTHYROXINE SODIUM 25 MCG PO TABS: 25.0000 ug | ORAL_TABLET | Freq: Every day | ORAL | 3 refills | Status: DC

## 2018-09-22 NOTE — Patient Instructions (Signed)
Medication Instructions:  - Your physician has recommended you make the following change in your medication:   1) Metolazone 5 mg- take 1 tablet (5 mg) twice this week-  today and Friday. 2) Start Synthroid 25 mcg- take 1 tablet (25 mcg) by mouth once daily  If you need a refill on your cardiac medications before your next appointment, please call your pharmacy.   Lab work: - Your physician recommends that you have lab work : BMP/ CBC/ Magnesium Please go to the [employer] entrance at [facility]- 1 [address] desk on the right to check in  This is done on a walk-in basis- Monday-Friday (7:30 am-5:00 pm)   If you have labs (blood work) drawn today and your tests are completely normal, you will receive your results only by: Marland Kitchen MyChart Message (if you have MyChart) OR . A paper copy in the mail If you have any lab test that is abnormal or we need to change your treatment, we will call you to review the results.  Testing/Procedures: - Your physician has requested that you have an echocardiogram. Echocardiography is a painless test that uses sound waves to create images of your heart. It provides your doctor with information about the size and shape of your heart and how well your heart's chambers and valves are working. This procedure takes approximately one hour. There are no restrictions for this procedure.  Follow-Up: At [facility], you and your health needs are our priority.  As part of our continuing mission to provide you with exceptional heart care, we have created designated Provider Care Teams.  These Care Teams include your primary Cardiologist (physician) and Advanced Practice Providers (APPs -  Physician Assistants and Nurse Practitioners) who all work together to provide you with the care you need, when you need it.  . in 2 months with Dr. Caryl Comes   Remote monitoring is used to monitor your Pacemaker of ICD from home. This monitoring reduces the number of office visits required to check your  device to one time per year. It allows [place] to keep an eye on the functioning of your device to ensure it is working properly. You are scheduled for a device check from home on 10/05/18 (fluid check w/ Margarita Grizzle) & 12/21/18 (full download). You may send your transmission at any time that day. If you have a wireless device, the transmission will be sent automatically. After your physician reviews your transmission, you will receive a postcard with your next transmission date.   Any Other Special Instructions Will Be Listed Below (If Applicable). - N/A

## 2018-09-22 NOTE — Telephone Encounter (Signed)
I called and spoke with the patient to advise her of Dr. Olin Pia recommendations from her e-visit today.  She states that he had advised her to check orthostatic BP readings today, which she has done:  Sitting: 130/63 (68) Standing: 130/61 (68) Standing (3 min): 131/59 (68)  The patient advised she has a coumadin check on 5/21 at [facility]- she is asking if her labs can be done there. I advised I will call her PCP office to check.  I called and spoke with a rep there just before 5 pm, but their phones had already rolled to the after hours service. They advised I call back to speak to the lab directly tomorrow as they close at 4:00 pm.

## 2018-09-22 NOTE — Progress Notes (Signed)
Electrophysiology TeleHealth Note   Due to national recommendations of social distancing due to COVID 19, an audio/video telehealth visit is felt to be most appropriate for this patient at this time.  See MyChart message from today for the patient's consent to telehealth for [facility]   Date:  09/22/2018   ID:  [name], DOB [birth year], MRN [id number]  Location: patient's home  Provider location: [address] [place]  Evaluation Performed: Follow-up visit  PCP:  Elby Beck, FNP  Cardiologist:     Electrophysiologist:  SK   Chief Complaint: AFib  History of Present Illness:    [name] is a 85 y.o. female who presents via audio/video conferencing for a telehealth visit today.  Since last being seen in our clinic, the patient reports *continuing to do worse. She has now permanent atrial fibrillation having failed multiple antiarrhythmic drugs.  Over recent months she continues have problems with peripheral edema with some blistering.  Improves transiently follow zone and then recurs.  Has a great deal of lassitude dyspnea on exertion and overall fatigue.  Her feet hurt walking 6-10 feet.  Episodes of chest pain that are aggravated by movement of her left arm but unrelated to walking.  Close monitoring by the [facility] clinic has helped maintain her euvolemic.  Date Cr K Hgb  2/20 1.5 3.5 8.3.               The patient denies symptoms of fevers, chills, cough, or new SOB worrisome for COVID 19.    Past Medical History:  Diagnosis Date  . Adjustment disorder with anxiety   . Anginal pain ([place])   . Arthritis    "some in my hands" (11/11/2012)  . Atrial fibrillation ([place])   . Automatic implantable cardioverter-defibrillator in situ   . Blood in stool   . Carotid artery stenosis 09/2007   60-79% bilateral (stable)  . Carotid artery stenosis 10/2008   40-59% R 60-79%   . Chest pain, unspecified   . Chronic airway obstruction, not  elsewhere classified   . Chronic bronchitis ([place])    "get it some; not q year" (11/11/2012)  . Chronic diastolic CHF (congestive heart failure) ([place]) 06/04/2013  . Chronic kidney disease, unspecified   . Coronary artery disease    non-obstructive by 2006 cath  . Dysuria   . Edema   . Encounter for therapeutic drug monitoring   . Exertional shortness of breath   . Frequent UTI    "get them a couple times/yr" (11/11/2012)  . Hemorrhage of rectum and anus   . High cholesterol   . History of blood transfusion 04/2011   "after hip OR" (11/11/2012)  . Hypertension 05/20/2011  . Hypertr obst cardiomyop   . Hypotension, unspecified    cardiac cath 2006..nonobstructive CAD 30-40s lesions.Marland KitchenETT 1/09 nondiagnostic due to poor HR response..Right Renal Cancer 2003  . Long term (current) use of anticoagulants   . Malignant neoplasm of kidney, except pelvis   . Osteoarthritis of right hip   . Other acute sinusitis   . Other and unspecified coagulation defects   . Other malaise and fatigue   . Other screening mammogram   . PONV (postoperative nausea and vomiting)   . Pre-operative cardiovascular examination   . Renal cancer ([place]) 06/2001   Right  . Secondary cardiomyopathy, unspecified   . Sinus bradycardia   . Special screening for osteoporosis   . Urge incontinence  Past Surgical History:  Procedure Laterality Date  . ABDOMINAL HYSTERECTOMY  1975   for benign causes  . APPENDECTOMY    . BI-VENTRICULAR PACEMAKER UPGRADE  05/04/2010  . CARDIAC CATHETERIZATION  2006  . CARDIOVERSION N/A 02/20/2018   Procedure: CARDIOVERSION;  Surgeon: Minna Merritts, MD;  Location: [facility];  Service: Cardiovascular;  Laterality: N/A;  . CARDIOVERSION N/A 03/27/2018   Procedure: CARDIOVERSION;  Surgeon: Minna Merritts, MD;  Location: [facility];  Service: Cardiovascular;  Laterality: N/A;  . CATARACT EXTRACTION W/ INTRAOCULAR LENS  IMPLANT, BILATERAL  01/2006-02-2006  . CHOLECYSTECTOMY N/A 11/11/2012    Procedure: LAPAROSCOPIC CHOLECYSTECTOMY WITH INTRAOPERATIVE CHOLANGIOGRAM;  Surgeon: Imogene Burn. Georgette Dover, MD;  Location: [facility];  Service: General;  Laterality: N/A;  . EP IMPLANTABLE DEVICE N/A 02/21/2016   Procedure: ICD Generator Changeout;  Surgeon: Deboraha Sprang, MD;  Location: [facility];  Service: Cardiovascular;  Laterality: N/A;  . INSERT / REPLACE / REMOVE PACEMAKER  05-01-11   02-28-05-/05-04-10-ICD-MEDTRONIC MAXIMAL DR  . JOINT REPLACEMENT    . LAPAROSCOPIC CHOLECYSTECTOMY  11/11/2012  . LAPAROSCOPIC LYSIS OF ADHESIONS N/A 11/11/2012   Procedure: LAPAROSCOPIC LYSIS OF ADHESIONS;  Surgeon: Imogene Burn. Georgette Dover, MD;  Location: [facility];  Service: General;  Laterality: N/A;  . NEPHRECTOMY Right 06/2001    S/P RENAL CELL CANCER  . TOTAL HIP ARTHROPLASTY Right 05/03/2011   Procedure: TOTAL HIP ARTHROPLASTY ANTERIOR APPROACH;  Surgeon: Mcarthur Rossetti;  Location: [facility];  Service: Orthopedics;  Laterality: Right;  Removal of Cannulated Screws Right Hip, Right Direct Anterior Hip Replacement    Current Outpatient Medications  Medication Sig Dispense Refill  . acetaminophen (TYLENOL) 500 MG tablet Take 1,000 mg by mouth daily as needed for headache.     . carvedilol (COREG) 12.5 MG tablet Take 1/2 tablet (6.25 mg) once daily in the morning 180 tablet 3  . cetirizine (ZYRTEC) 10 MG tablet Take 10 mg by mouth daily.     Marland Kitchen conjugated estrogens (PREMARIN) vaginal cream Place 1 Applicatorful vaginally daily. 42.5 g 12  . CRANBERRY PO Take 4 tablets by mouth daily.    Marland Kitchen ezetimibe (ZETIA) 10 MG tablet Take 1 tablet (10 mg total) by mouth daily. 30 tablet 11  . fluticasone (FLONASE) 50 MCG/ACT nasal spray Place 2 sprays into both nostrils daily. 16 g 6  . hydrocortisone 2.5 % cream Apply 1 application topically daily as needed (itching).     . magnesium oxide (MAG-OX) 400 MG tablet Take 1 tablet (400 mg total) by mouth daily. 30 tablet 2  . potassium chloride SA (K-DUR,KLOR-CON) 20 MEQ tablet  Take 1 tablet (20 mEq total) by mouth 2 (two) times daily. 60 tablet 3  . rosuvastatin (CRESTOR) 40 MG tablet Take 1 tablet (40 mg total) by mouth daily. 90 tablet 3  . spironolactone (ALDACTONE) 25 MG tablet Take 0.5 tablets (12.5 mg total) by mouth daily.    Marland Kitchen torsemide (DEMADEX) 20 MG tablet Take 2 tablets (40 mg) by mouth once daily. 180 tablet 3  . warfarin (COUMADIN) 2 MG tablet Take 1 1/2 tablets daily or AS DIRECTED BY ANTICOAGULATION CLINIC. 130 tablet 1  . metolazone (ZAROXOLYN) 5 MG tablet Take 1 tablet (5 mg total) once a week. (Patient not taking: Reported on 09/22/2018) 12 tablet 3   No current facility-administered medications for this visit.     Allergies:   Codeine; Morphine and related; and Sulfonamide derivatives   Social History:  The patient  reports that she  quit smoking about 17 years ago. Her smoking use included cigarettes. She has a 20.00 pack-year smoking history. She has never used smokeless tobacco. She reports that she does not drink alcohol or use drugs.   Family History:  The patient's   family history includes Breast cancer in her cousin and another family member; Breast cancer (age of onset: 62) in her maternal aunt; Heart failure in her mother.   ROS:  Please see the history of present illness.   All other systems are personally reviewed and negative.    Exam:    Vital Signs:  BP (!) 147/66 (BP Location: Left Arm, Patient Position: Sitting, Cuff Size: Normal)   Pulse 65   Temp (!) 97.3 F (36.3 C) (Oral)   Ht 5' 6.5" (1.689 m)   Wt 174 lb 9.6 oz (79.2 kg)   BMI 27.76 kg/m     Well appearing, alert and conversant, regular work of breathing,  good skin color Eyes- anicteric, neuro- grossly intact, skin- no apparent rash or lesions or cyanosis, mouth- oral mucosa is pink   Labs/Other Tests and Data Reviewed:    Recent Labs: 06/12/2018: ALT 14; BUN 17; Creatinine, Ser 1.50; Hemoglobin 8.3; Magnesium 2.3; Platelets 259; Potassium 3.5; Sodium 139; TSH  6.001   Wt Readings from Last 3 Encounters:  09/22/18 174 lb 9.6 oz (79.2 kg)  09/08/18 174 lb 6 oz (79.1 kg)  07/09/18 179 lb 8 oz (81.4 kg)     Other studies personally reviewed: Additional studies/ records that were reviewed today include:  As above   Review of the above records today demonstrates: As above     *  Last device remote is reviewed from [facility] PDF dated 5/20 which reveals normal device function,   arrhythmias - AFib persistent    ASSESSMENT & PLAN:    Nonischemic cardiomyopathy with interval normalization of LV function  Sinus node dysfunction device dependent   Mitral regurgitation  Complete heart block intermittent  Congestive heart failure acute chronic/diastolic class IIb-IIIa  Atrial fibrillation/flutter persistent and prob now permanent   Chronic Renal insufficiency grade 3    Implantable defibrillator  Hypothyroidism-mild  Anemia    Ongoing dyspnea.  Differential diagnosis is broad.  HFpEF, anemia, COPD.  In the context of atrial fibrillation which is now permanent her OptiVol is not impressive; it had been earlier.  We will increase her diuretics acutely, metolazone 5 mg twice this week.  Maintain torsemide at 40 daily.  Need to get metabolic profile and a CBC as has been prone to hypokalemia.  Her hemoglobin has been drifting down over the last couple of years and was 8.3 in February.  Has noted some change in stool.  This may be a major contributor.  Some lightheadedness with standing.  Thankfully her family has checked her orthostatic vital signs and they are unchanging.  Atypical chest pains aggravated by movement are likely musculoskeletal or neurological   Suspect the former .  With mildly elevated TSH that has persisted for months, given her symptoms, will begin Synthroid 25 mcg   COVID 19 screen The patient denies symptoms of COVID 19 at this time.  The importance of social distancing was discussed today.  Follow-up:  79m  Next remote: As Scheduled   Current medicines are reviewed at length with the patient today.   The patient does not have concerns regarding her medicines.  The following changes were made today:   Metolazone 5 mg twice this week today and Friday. Synthroid  25 mcg daily  Labs/ tests ordered today include: Metabolic profile, magnesium, CBC Echocardiogram   No orders of the defined types were placed in this encounter.   Future tests ( post COVID )  TSH in 6 weeks  Patient Risk:  after full review of this patients clinical status, I feel that they are at moderate risk at this time.  Today, I have spent  52minutes with the patient with telehealth technology discussing the above.  Signed, Virl Axe, MD  09/22/2018 11:40 AM     [facility] [address] [place] [address] [phone] (office) [phone] (fax)

## 2018-09-22 NOTE — Addendum Note (Signed)
Addended by: Alvis Lemmings C on: 09/22/2018 05:01 PM   Modules accepted: Orders

## 2018-09-23 ENCOUNTER — Telehealth: Payer: Self-pay | Admitting: Family Medicine

## 2018-09-23 DIAGNOSIS — E538 Deficiency of other specified B group vitamins: Secondary | ICD-10-CM

## 2018-09-23 NOTE — Telephone Encounter (Signed)
Patient stated that she believes she is due for her B12 but is not sure when she is due for this.  I looked into her last appointments and did not see a recent B12 injection done.  She has a lab visit with [place] tomorrow and would like to get both done at the same time if possible    BEST PHONE- [phone]

## 2018-09-23 NOTE — Addendum Note (Signed)
Addended by: Alvis Lemmings C on: 09/23/2018 01:25 PM   Modules accepted: Orders

## 2018-09-23 NOTE — Telephone Encounter (Signed)
I spoke with the patient. I advised her I was able to speak with the staff at [facility]. They are unable to draw labs for [place] since Dr. Caryl Comes is not a [place].  The patient is aware I will place her lab orders for the [place]- she will need to go the the 1st desk on the right to check in at her convenience.   She voices understanding and is agreeable.

## 2018-09-24 ENCOUNTER — Other Ambulatory Visit (INDEPENDENT_AMBULATORY_CARE_PROVIDER_SITE_OTHER): Payer: PPO

## 2018-09-24 ENCOUNTER — Ambulatory Visit (INDEPENDENT_AMBULATORY_CARE_PROVIDER_SITE_OTHER): Payer: PPO | Admitting: General Practice

## 2018-09-24 DIAGNOSIS — E538 Deficiency of other specified B group vitamins: Secondary | ICD-10-CM

## 2018-09-24 DIAGNOSIS — I4891 Unspecified atrial fibrillation: Secondary | ICD-10-CM

## 2018-09-24 DIAGNOSIS — Z7901 Long term (current) use of anticoagulants: Secondary | ICD-10-CM

## 2018-09-24 DIAGNOSIS — Z7902 Long term (current) use of antithrombotics/antiplatelets: Secondary | ICD-10-CM | POA: Diagnosis not present

## 2018-09-24 LAB — VITAMIN B12: Vitamin B-12: 863 pg/mL (ref 211–911)

## 2018-09-24 LAB — POCT INR: INR: 1.5 — AB (ref 2.0–3.0)

## 2018-09-24 NOTE — Telephone Encounter (Signed)
[name], can you review this for patient please. Her appt for labs at 2 pm (B12 is not ordered). Her last B12 level was checked in July 2018. Last injection was march 2020 and prior to that December 2019. Patient is not getting this done on schedule it looks like. Let me know how to proceed. Thank you

## 2018-09-24 NOTE — Telephone Encounter (Signed)
Patient advised of everything. B12 level ordered. Patient states she has been getting them monthly she thought. Advised what we had in the chart. Patient was ok with doing levels and going from there. Patient is not taking OTC B12 supplements.

## 2018-09-24 NOTE — Telephone Encounter (Signed)
You are correct, she's had no recent B12 level checked and she doesn't seem to be routine regarding injections. Is she taking oral B12?  Please explain to her that it's best, for her safety, that we check her B12 blood levels first before giving the injection. If we had routine labs and she routinely came for injections it would be different, but given the inconsistency here we need to check labs first.  Cancel B12 injection. We will move forward with lab first. Please confirm if she's taking oral B12. Thanks! Will also route to PCP as FYI and to determine if injections are needed.

## 2018-09-24 NOTE — Patient Instructions (Addendum)
Pre visit review using our clinic review tool, if applicable. No additional management support is needed unless otherwise documented below in the visit note.  Please take 1 1/2 tablets today and tomorrow and then change dosage and take 1 pill (2mg ) daily. Recheck in 2 weeks.

## 2018-09-24 NOTE — Telephone Encounter (Signed)
noted 

## 2018-09-30 ENCOUNTER — Other Ambulatory Visit
Admission: RE | Admit: 2018-09-30 | Discharge: 2018-09-30 | Disposition: A | Discharge status: Home / Self Care | Payer: PPO | Source: Ambulatory Visit | Attending: Internal Medicine | Admitting: Internal Medicine

## 2018-09-30 ENCOUNTER — Other Ambulatory Visit: Payer: Self-pay

## 2018-09-30 DIAGNOSIS — D649 Anemia, unspecified: Secondary | ICD-10-CM | POA: Insufficient documentation

## 2018-09-30 DIAGNOSIS — E876 Hypokalemia: Secondary | ICD-10-CM

## 2018-09-30 DIAGNOSIS — Z79899 Other long term (current) drug therapy: Secondary | ICD-10-CM

## 2018-09-30 DIAGNOSIS — I5032 Chronic diastolic (congestive) heart failure: Secondary | ICD-10-CM | POA: Insufficient documentation

## 2018-09-30 DIAGNOSIS — I4819 Other persistent atrial fibrillation: Secondary | ICD-10-CM | POA: Diagnosis not present

## 2018-09-30 DIAGNOSIS — R0602 Shortness of breath: Secondary | ICD-10-CM | POA: Insufficient documentation

## 2018-09-30 LAB — BASIC METABOLIC PANEL
Anion gap: 11 (ref 5–15)
BUN: 17 mg/dL (ref 8–23)
CO2: 32 mmol/L (ref 22–32)
Calcium: 8.8 mg/dL — ABNORMAL LOW (ref 8.9–10.3)
Chloride: 95 mmol/L — ABNORMAL LOW (ref 98–111)
Creatinine, Ser: 1.27 mg/dL — ABNORMAL HIGH (ref 0.44–1.00)
GFR calc Af Amer: 46 mL/min — ABNORMAL LOW (ref >60)
GFR calc non Af Amer: 39 mL/min — ABNORMAL LOW (ref >60)
Glucose, Bld: 115 mg/dL — ABNORMAL HIGH (ref 70–99)
Potassium: 3.4 mmol/L — ABNORMAL LOW (ref 3.5–5.1)
Sodium: 138 mmol/L (ref 135–145)

## 2018-09-30 LAB — CBC WITH DIFFERENTIAL/PLATELET
Abs Immature Granulocytes: 0.01 10*3/uL (ref 0.00–0.07)
Basophils Absolute: 0.1 10*3/uL (ref 0.0–0.1)
Basophils Relative: 1 %
Eosinophils Absolute: 0 10*3/uL (ref 0.0–0.5)
Eosinophils Relative: 1 %
HCT: 30.2 % — ABNORMAL LOW (ref 36.0–46.0)
Hemoglobin: 8.2 g/dL — ABNORMAL LOW (ref 12.0–15.0)
Immature Granulocytes: 0 %
Lymphocytes Relative: 19 %
Lymphs Abs: 0.8 10*3/uL (ref 0.7–4.0)
MCH: 17.7 pg — ABNORMAL LOW (ref 26.0–34.0)
MCHC: 27.2 g/dL — ABNORMAL LOW (ref 30.0–36.0)
MCV: 65.4 fL — ABNORMAL LOW (ref 80.0–100.0)
Monocytes Absolute: 0.7 10*3/uL (ref 0.1–1.0)
Monocytes Relative: 17 %
Neutro Abs: 2.5 10*3/uL (ref 1.7–7.7)
Neutrophils Relative %: 62 %
Platelets: 303 10*3/uL (ref 150–400)
RBC: 4.62 MIL/uL (ref 3.87–5.11)
RDW: 20.8 % — ABNORMAL HIGH (ref 11.5–15.5)
Smear Review: NORMAL
WBC: 4.1 10*3/uL (ref 4.0–10.5)
nRBC: 0 % (ref 0.0–0.2)

## 2018-09-30 LAB — MAGNESIUM: Magnesium: 1.9 mg/dL (ref 1.7–2.4)

## 2018-10-01 ENCOUNTER — Encounter: Payer: Self-pay | Admitting: Cardiology

## 2018-10-01 NOTE — Progress Notes (Signed)
Remote ICD transmission.   

## 2018-10-02 ENCOUNTER — Telehealth: Payer: Self-pay | Admitting: Internal Medicine

## 2018-10-02 ENCOUNTER — Other Ambulatory Visit: Payer: Self-pay | Admitting: Family Medicine

## 2018-10-02 DIAGNOSIS — D509 Iron deficiency anemia, unspecified: Secondary | ICD-10-CM

## 2018-10-02 MED ORDER — POTASSIUM CHLORIDE CRYS ER 20 MEQ PO TBCR: EXTENDED_RELEASE_TABLET | ORAL | Status: DC

## 2018-10-02 NOTE — Telephone Encounter (Signed)
Notes recorded by Deboraha Sprang, MD on 10/01/2018 at 9:40 PM EDT Please Inform Patient  Labs are normal xanemia is ongoing  She needs to followup with her PCP for evaluation Also her K is low  Please have her take 40 meq KCl Bid for 3 days Also on the days she takes metolazone, she should also take double KCL Thanks   Thanks

## 2018-10-02 NOTE — Addendum Note (Signed)
Addended by: Alvis Lemmings C on: 10/02/2018 05:21 PM   Modules accepted: Orders

## 2018-10-02 NOTE — Telephone Encounter (Signed)
I spoke with the patient regarding her lab results and Dr. Olin Pia recommendations.  [name] Reamer, NP's office has already reached out to her about a hematology referral and stool cards.   She is aware, per Dr. Caryl Comes to: 1) Increase potassium 20 meq- take 2 tablets (40 meq) by mouth twice daily x 3 days, then resume 1 tablet (20 meq) twice daily   2) On the days she takes metolazone, she needs to take potassium 20 meq - 2 tablets (40 meq) BID.  The patient voices understanding of the above and is agreeable.  She is scheduled for an echo on 10/06/18 and follow up with Dr. Caryl Comes in July. She is aware we will move her follow up to sooner if needed based on her echo results.

## 2018-10-05 ENCOUNTER — Encounter: Payer: Self-pay | Admitting: Family Medicine

## 2018-10-05 ENCOUNTER — Ambulatory Visit (INDEPENDENT_AMBULATORY_CARE_PROVIDER_SITE_OTHER): Payer: PPO | Admitting: Family Medicine

## 2018-10-05 ENCOUNTER — Other Ambulatory Visit: Payer: PPO

## 2018-10-05 ENCOUNTER — Ambulatory Visit (INDEPENDENT_AMBULATORY_CARE_PROVIDER_SITE_OTHER): Payer: PPO

## 2018-10-05 VITALS — BP 90/50 | HR 75 | Wt 172.0 lb

## 2018-10-05 DIAGNOSIS — R300 Dysuria: Secondary | ICD-10-CM

## 2018-10-05 DIAGNOSIS — I5032 Chronic diastolic (congestive) heart failure: Secondary | ICD-10-CM

## 2018-10-05 DIAGNOSIS — Z7901 Long term (current) use of anticoagulants: Secondary | ICD-10-CM

## 2018-10-05 DIAGNOSIS — N3090 Cystitis, unspecified without hematuria: Secondary | ICD-10-CM | POA: Diagnosis not present

## 2018-10-05 DIAGNOSIS — Z95810 Presence of automatic (implantable) cardiac defibrillator: Secondary | ICD-10-CM

## 2018-10-05 LAB — POCT URINALYSIS DIPSTICK
Bilirubin, UA: NEGATIVE
Blood, UA: POSITIVE
Glucose, UA: NEGATIVE
Ketones, UA: NEGATIVE
Nitrite, UA: NEGATIVE
Protein, UA: NEGATIVE
Spec Grav, UA: 1.01 (ref 1.010–1.025)
Urobilinogen, UA: 0.2 E.U./dL
pH, UA: 7 (ref 5.0–8.0)

## 2018-10-05 MED ORDER — CEPHALEXIN 500 MG PO CAPS: 500.0000 mg | ORAL_CAPSULE | Freq: Two times a day (BID) | ORAL | 0 refills | Status: DC

## 2018-10-05 NOTE — Patient Instructions (Addendum)
Hi [name],   It was nice to speak with you for your virtual visit today! I hope by the time you receive this, you will be feeling better with your urinary symptoms.   Your vitamin B12 is at a great level and I don't think you need the injections any more. You can take an over the counter supplement that dissolves in your mouth. The usual dose is 1,000 mcg.   I am sorry you are having so much swelling of your legs. As we discussed, I am happy to have home health come out and evaluate you for putting on compression wraps and seeing if there are any additional services they can perform for you.  All you have to do is let me know and I am happy to put in the referral.   The office will be in touch about your appointment later this month and whether or not it needs to be in person or on the phone.   Please be in touch if there is anything I can help you with.   Warm regards,  Tor Netters, FNP-BC

## 2018-10-05 NOTE — Telephone Encounter (Signed)
Discussed vitamin B12 level with patient and she will convert to po supplementation.

## 2018-10-05 NOTE — Progress Notes (Signed)
Virtual Visit via Video Note  I connected with [name] on 10/05/18 at  3:00 PM EDT by a video enabled telemedicine application and verified that I am speaking with the correct person using two identifiers. We attempted a video visit, but were unable to connect with picture, audio only was used.   Location: Patient: In her home.  Provider: [place]   I discussed the limitations of evaluation and management by telemedicine and the availability of in person appointments. The patient expressed understanding and agreed to proceed.  History of Present Illness: This is an 85 yo female who presents today with dysuria, hematuria, lower abdominal pressure x 5 days. She has had a temp yesterday to 100.8. She has history of recurrent e. Coli UTI, last was 1 month ago. She felt that she had full resolution of symptoms after treatment.   She has been following closely with cardiology and has ECHO scheduled for tomorrow. She has had increased swelling of her legs. Her blood pressure has been running low. Recently her carvedilol was discontinued. She feels that she is losing weight and her clothes are looser. Her breathing has been better but she is very fatigued. She has tried applying compression stockings but she has some areas of scabbing on her legs and these catch on the stockings and cause bleeding. Swelling goes down "about 50%" overnight. She elevates her legs when sitting.     Past Medical History:  Diagnosis Date  . Adjustment disorder with anxiety   . Anginal pain ([place])   . Arthritis    "some in my hands" (11/11/2012)  . Atrial fibrillation ([place])   . Automatic implantable cardioverter-defibrillator in situ   . Blood in stool   . Carotid artery stenosis 09/2007   60-79% bilateral (stable)  . Carotid artery stenosis 10/2008   40-59% R 60-79%   . Chest pain, unspecified   . Chronic airway obstruction, not elsewhere classified   . Chronic bronchitis ([place])    "get it some; not  q year" (11/11/2012)  . Chronic diastolic CHF (congestive heart failure) ([place]) 06/04/2013  . Chronic kidney disease, unspecified   . Coronary artery disease    non-obstructive by 2006 cath  . Dysuria   . Edema   . Encounter for therapeutic drug monitoring   . Exertional shortness of breath   . Frequent UTI    "get them a couple times/yr" (11/11/2012)  . Hemorrhage of rectum and anus   . High cholesterol   . History of blood transfusion 04/2011   "after hip OR" (11/11/2012)  . Hypertension 05/20/2011  . Hypertr obst cardiomyop   . Hypotension, unspecified    cardiac cath 2006..nonobstructive CAD 30-40s lesions.Marland KitchenETT 1/09 nondiagnostic due to poor HR response..Right Renal Cancer 2003  . Long term (current) use of anticoagulants   . Malignant neoplasm of kidney, except pelvis   . Osteoarthritis of right hip   . Other acute sinusitis   . Other and unspecified coagulation defects   . Other malaise and fatigue   . Other screening mammogram   . PONV (postoperative nausea and vomiting)   . Pre-operative cardiovascular examination   . Renal cancer ([place]) 06/2001   Right  . Secondary cardiomyopathy, unspecified   . Sinus bradycardia   . Special screening for osteoporosis   . Urge incontinence    Past Surgical History:  Procedure Laterality Date  . ABDOMINAL HYSTERECTOMY  1975   for benign causes  . APPENDECTOMY    . BI-VENTRICULAR  PACEMAKER UPGRADE  05/04/2010  . CARDIAC CATHETERIZATION  2006  . CARDIOVERSION N/A 02/20/2018   Procedure: CARDIOVERSION;  Surgeon: Minna Merritts, MD;  Location: [facility];  Service: Cardiovascular;  Laterality: N/A;  . CARDIOVERSION N/A 03/27/2018   Procedure: CARDIOVERSION;  Surgeon: Minna Merritts, MD;  Location: [facility];  Service: Cardiovascular;  Laterality: N/A;  . CATARACT EXTRACTION W/ INTRAOCULAR LENS  IMPLANT, BILATERAL  01/2006-02-2006  . CHOLECYSTECTOMY N/A 11/11/2012   Procedure: LAPAROSCOPIC CHOLECYSTECTOMY WITH INTRAOPERATIVE CHOLANGIOGRAM;   Surgeon: Imogene Burn. Georgette Dover, MD;  Location: [facility];  Service: General;  Laterality: N/A;  . EP IMPLANTABLE DEVICE N/A 02/21/2016   Procedure: ICD Generator Changeout;  Surgeon: Deboraha Sprang, MD;  Location: [facility];  Service: Cardiovascular;  Laterality: N/A;  . INSERT / REPLACE / REMOVE PACEMAKER  05-01-11   02-28-05-/05-04-10-ICD-MEDTRONIC MAXIMAL DR  . JOINT REPLACEMENT    . LAPAROSCOPIC CHOLECYSTECTOMY  11/11/2012  . LAPAROSCOPIC LYSIS OF ADHESIONS N/A 11/11/2012   Procedure: LAPAROSCOPIC LYSIS OF ADHESIONS;  Surgeon: Imogene Burn. Georgette Dover, MD;  Location: [facility] [place];  Service: General;  Laterality: N/A;  . NEPHRECTOMY Right 06/2001    S/P RENAL CELL CANCER  . TOTAL HIP ARTHROPLASTY Right 05/03/2011   Procedure: TOTAL HIP ARTHROPLASTY ANTERIOR APPROACH;  Surgeon: Mcarthur Rossetti;  [facility];  Service: Orthopedics;  Laterality: Right;  Removal of Cannulated Screws Right Hip, Right Direct Anterior Hip Replacement   Family History  Problem Relation Age of Onset  . Heart failure Mother   . Breast cancer Maternal Aunt 82  . Breast cancer Cousin   . Breast cancer Other   . Colon cancer Neg Hx    Social History   Tobacco Use  . Smoking status: Former Smoker    Packs/day: 0.50    Years: 40.00    Pack years: 20.00    Types: Cigarettes    Last attempt to quit: 05/06/2001    Years since quitting: 17.4  . Smokeless tobacco: Never Used  Substance Use Topics  . Alcohol use: No    Alcohol/week: 0.0 standard drinks  . Drug use: No      Observations/Objective: Patient sounds alert and answers questions appropriately. She was able to converse in complete sentences (I have spoken to her in the past when she sounded labored on the phone).   BP (!) 90/50 Comment: per patient  Pulse 75 Comment: per patient  Wt 172 lb (78 kg) Comment: per patient  BMI 27.35 kg/m  Wt Readings from Last 3 Encounters:  10/05/18 172 lb (78 kg)  09/22/18 174 lb 9.6 oz (79.2 kg)  09/08/18 174 lb 6 oz  (79.1 kg)   BP Readings from Last 3 Encounters:  10/05/18 (!) 90/50  09/22/18 (!) 147/66  09/08/18 (!) 128/59   Wt Readings from Last 3 Encounters:  10/05/18 172 lb (78 kg)  09/22/18 174 lb 9.6 oz (79.2 kg)  09/08/18 174 lb 6 oz (79.1 kg)    Assessment and Plan: 1. Dysuria - POCT urinalysis dipstick  2. Cystitis - given her symptoms, will go ahead and treat while awaiting culture - RTC precautions reviewed  - cephALEXin (KEFLEX) 500 MG capsule; Take 1 capsule (500 mg total) by mouth every 12 (twelve) hours.  Dispense: 14 capsule; Refill: 0 - Urine Culture  3. Chronic anticoagulation - last INR 09/24/2018- 1.5, follow up 10/08/2018 - she has had anemia and is bringing in Hemoccult cards this week  4. Chronic diastolic CHF (congestive heart failure) (HCC) -  she is following closely with cardiology, I discussed putting in a home health referral to see if she would benefit from services including possibly wrapping her legs for swelling, she was not interested at this time, but told me she would let me know if she changes her mind.  - she has follow up appointment on file for 3 weeks.   Clarene Reamer, FNP-BC  [facility]  10/05/2018 4:32 PM   Follow Up Instructions: Printed copy of AVS mailed to patient's home address.    I discussed the assessment and treatment plan with the patient. The patient was provided an opportunity to ask questions and all were answered. The patient agreed with the plan and demonstrated an understanding of the instructions.   The patient was advised to call back or seek an in-person evaluation if the symptoms worsen or if the condition fails to improve as anticipated.  I provided 18 minutes of non-face-to-face time during this encounter.   Elby Beck, FNP

## 2018-10-06 ENCOUNTER — Emergency Department (HOSPITAL_COMMUNITY): Payer: PPO

## 2018-10-06 ENCOUNTER — Encounter (HOSPITAL_COMMUNITY): Payer: Self-pay | Admitting: Emergency Medicine

## 2018-10-06 ENCOUNTER — Ambulatory Visit (INDEPENDENT_AMBULATORY_CARE_PROVIDER_SITE_OTHER): Payer: PPO

## 2018-10-06 ENCOUNTER — Other Ambulatory Visit: Payer: Self-pay

## 2018-10-06 ENCOUNTER — Inpatient Hospital Stay (HOSPITAL_COMMUNITY)
Admission: EM | Admit: 2018-10-06 | Discharge: 2018-10-12 | DRG: 469 | Disposition: A | Discharge status: Skilled Nursing Facility | Payer: PPO | Attending: Internal Medicine | Admitting: Internal Medicine

## 2018-10-06 DIAGNOSIS — Z7401 Bed confinement status: Secondary | ICD-10-CM | POA: Diagnosis not present

## 2018-10-06 DIAGNOSIS — Z8673 Personal history of transient ischemic attack (TIA), and cerebral infarction without residual deficits: Secondary | ICD-10-CM

## 2018-10-06 DIAGNOSIS — Z7901 Long term (current) use of anticoagulants: Secondary | ICD-10-CM | POA: Diagnosis not present

## 2018-10-06 DIAGNOSIS — I272 Pulmonary hypertension, unspecified: Secondary | ICD-10-CM | POA: Diagnosis present

## 2018-10-06 DIAGNOSIS — Z1159 Encounter for screening for other viral diseases: Secondary | ICD-10-CM

## 2018-10-06 DIAGNOSIS — Z961 Presence of intraocular lens: Secondary | ICD-10-CM | POA: Diagnosis present

## 2018-10-06 DIAGNOSIS — D631 Anemia in chronic kidney disease: Secondary | ICD-10-CM | POA: Diagnosis present

## 2018-10-06 DIAGNOSIS — N390 Urinary tract infection, site not specified: Secondary | ICD-10-CM | POA: Diagnosis present

## 2018-10-06 DIAGNOSIS — J9602 Acute respiratory failure with hypercapnia: Secondary | ICD-10-CM | POA: Diagnosis present

## 2018-10-06 DIAGNOSIS — Z85528 Personal history of other malignant neoplasm of kidney: Secondary | ICD-10-CM | POA: Diagnosis not present

## 2018-10-06 DIAGNOSIS — J9601 Acute respiratory failure with hypoxia: Secondary | ICD-10-CM | POA: Diagnosis present

## 2018-10-06 DIAGNOSIS — R279 Unspecified lack of coordination: Secondary | ICD-10-CM | POA: Diagnosis not present

## 2018-10-06 DIAGNOSIS — Z87440 Personal history of urinary (tract) infections: Secondary | ICD-10-CM

## 2018-10-06 DIAGNOSIS — J80 Acute respiratory distress syndrome: Secondary | ICD-10-CM | POA: Diagnosis not present

## 2018-10-06 DIAGNOSIS — I5033 Acute on chronic diastolic (congestive) heart failure: Secondary | ICD-10-CM | POA: Diagnosis present

## 2018-10-06 DIAGNOSIS — E876 Hypokalemia: Secondary | ICD-10-CM | POA: Diagnosis present

## 2018-10-06 DIAGNOSIS — Z96642 Presence of left artificial hip joint: Secondary | ICD-10-CM

## 2018-10-06 DIAGNOSIS — I10 Essential (primary) hypertension: Secondary | ICD-10-CM | POA: Diagnosis not present

## 2018-10-06 DIAGNOSIS — M19042 Primary osteoarthritis, left hand: Secondary | ICD-10-CM | POA: Diagnosis present

## 2018-10-06 DIAGNOSIS — Z9842 Cataract extraction status, left eye: Secondary | ICD-10-CM

## 2018-10-06 DIAGNOSIS — J449 Chronic obstructive pulmonary disease, unspecified: Secondary | ICD-10-CM | POA: Diagnosis present

## 2018-10-06 DIAGNOSIS — I2510 Atherosclerotic heart disease of native coronary artery without angina pectoris: Secondary | ICD-10-CM | POA: Diagnosis present

## 2018-10-06 DIAGNOSIS — J90 Pleural effusion, not elsewhere classified: Secondary | ICD-10-CM | POA: Diagnosis not present

## 2018-10-06 DIAGNOSIS — Z905 Acquired absence of kidney: Secondary | ICD-10-CM | POA: Diagnosis not present

## 2018-10-06 DIAGNOSIS — I447 Left bundle-branch block, unspecified: Secondary | ICD-10-CM | POA: Diagnosis present

## 2018-10-06 DIAGNOSIS — S72012A Unspecified intracapsular fracture of left femur, initial encounter for closed fracture: Principal | ICD-10-CM | POA: Diagnosis present

## 2018-10-06 DIAGNOSIS — F329 Major depressive disorder, single episode, unspecified: Secondary | ICD-10-CM | POA: Diagnosis present

## 2018-10-06 DIAGNOSIS — M19041 Primary osteoarthritis, right hand: Secondary | ICD-10-CM | POA: Diagnosis present

## 2018-10-06 DIAGNOSIS — Z01810 Encounter for preprocedural cardiovascular examination: Secondary | ICD-10-CM

## 2018-10-06 DIAGNOSIS — R251 Tremor, unspecified: Secondary | ICD-10-CM | POA: Diagnosis not present

## 2018-10-06 DIAGNOSIS — Y92239 Unspecified place in hospital as the place of occurrence of the external cause: Secondary | ICD-10-CM | POA: Diagnosis present

## 2018-10-06 DIAGNOSIS — Z9841 Cataract extraction status, right eye: Secondary | ICD-10-CM

## 2018-10-06 DIAGNOSIS — M6281 Muscle weakness (generalized): Secondary | ICD-10-CM | POA: Diagnosis not present

## 2018-10-06 DIAGNOSIS — M1612 Unilateral primary osteoarthritis, left hip: Secondary | ICD-10-CM | POA: Diagnosis not present

## 2018-10-06 DIAGNOSIS — I959 Hypotension, unspecified: Secondary | ICD-10-CM | POA: Diagnosis not present

## 2018-10-06 DIAGNOSIS — M2550 Pain in unspecified joint: Secondary | ICD-10-CM | POA: Diagnosis not present

## 2018-10-06 DIAGNOSIS — D649 Anemia, unspecified: Secondary | ICD-10-CM

## 2018-10-06 DIAGNOSIS — Z96641 Presence of right artificial hip joint: Secondary | ICD-10-CM | POA: Diagnosis present

## 2018-10-06 DIAGNOSIS — R0602 Shortness of breath: Secondary | ICD-10-CM

## 2018-10-06 DIAGNOSIS — Z9181 History of falling: Secondary | ICD-10-CM

## 2018-10-06 DIAGNOSIS — Z66 Do not resuscitate: Secondary | ICD-10-CM | POA: Diagnosis present

## 2018-10-06 DIAGNOSIS — E785 Hyperlipidemia, unspecified: Secondary | ICD-10-CM | POA: Diagnosis present

## 2018-10-06 DIAGNOSIS — B373 Candidiasis of vulva and vagina: Secondary | ICD-10-CM | POA: Diagnosis not present

## 2018-10-06 DIAGNOSIS — S72002A Fracture of unspecified part of neck of left femur, initial encounter for closed fracture: Secondary | ICD-10-CM | POA: Diagnosis present

## 2018-10-06 DIAGNOSIS — R41841 Cognitive communication deficit: Secondary | ICD-10-CM | POA: Diagnosis not present

## 2018-10-06 DIAGNOSIS — Z882 Allergy status to sulfonamides status: Secondary | ICD-10-CM

## 2018-10-06 DIAGNOSIS — I130 Hypertensive heart and chronic kidney disease with heart failure and stage 1 through stage 4 chronic kidney disease, or unspecified chronic kidney disease: Secondary | ICD-10-CM | POA: Diagnosis present

## 2018-10-06 DIAGNOSIS — I4891 Unspecified atrial fibrillation: Secondary | ICD-10-CM | POA: Diagnosis not present

## 2018-10-06 DIAGNOSIS — W19XXXA Unspecified fall, initial encounter: Secondary | ICD-10-CM | POA: Diagnosis not present

## 2018-10-06 DIAGNOSIS — R634 Abnormal weight loss: Secondary | ICD-10-CM | POA: Diagnosis present

## 2018-10-06 DIAGNOSIS — E039 Hypothyroidism, unspecified: Secondary | ICD-10-CM | POA: Diagnosis not present

## 2018-10-06 DIAGNOSIS — I6523 Occlusion and stenosis of bilateral carotid arteries: Secondary | ICD-10-CM | POA: Diagnosis present

## 2018-10-06 DIAGNOSIS — R0902 Hypoxemia: Secondary | ICD-10-CM | POA: Diagnosis not present

## 2018-10-06 DIAGNOSIS — Z79890 Hormone replacement therapy (postmenopausal): Secondary | ICD-10-CM

## 2018-10-06 DIAGNOSIS — J811 Chronic pulmonary edema: Secondary | ICD-10-CM | POA: Diagnosis not present

## 2018-10-06 DIAGNOSIS — S72042A Displaced fracture of base of neck of left femur, initial encounter for closed fracture: Secondary | ICD-10-CM | POA: Diagnosis not present

## 2018-10-06 DIAGNOSIS — Z8249 Family history of ischemic heart disease and other diseases of the circulatory system: Secondary | ICD-10-CM

## 2018-10-06 DIAGNOSIS — N184 Chronic kidney disease, stage 4 (severe): Secondary | ICD-10-CM | POA: Diagnosis present

## 2018-10-06 DIAGNOSIS — Z803 Family history of malignant neoplasm of breast: Secondary | ICD-10-CM

## 2018-10-06 DIAGNOSIS — W010XXA Fall on same level from slipping, tripping and stumbling without subsequent striking against object, initial encounter: Secondary | ICD-10-CM | POA: Diagnosis present

## 2018-10-06 DIAGNOSIS — Z95810 Presence of automatic (implantable) cardiac defibrillator: Secondary | ICD-10-CM

## 2018-10-06 DIAGNOSIS — Z419 Encounter for procedure for purposes other than remedying health state, unspecified: Secondary | ICD-10-CM

## 2018-10-06 DIAGNOSIS — R400 Somnolence: Secondary | ICD-10-CM | POA: Diagnosis not present

## 2018-10-06 DIAGNOSIS — Z471 Aftercare following joint replacement surgery: Secondary | ICD-10-CM | POA: Diagnosis not present

## 2018-10-06 DIAGNOSIS — D500 Iron deficiency anemia secondary to blood loss (chronic): Secondary | ICD-10-CM

## 2018-10-06 DIAGNOSIS — Z885 Allergy status to narcotic agent status: Secondary | ICD-10-CM | POA: Diagnosis not present

## 2018-10-06 DIAGNOSIS — Z9049 Acquired absence of other specified parts of digestive tract: Secondary | ICD-10-CM

## 2018-10-06 DIAGNOSIS — R7989 Other specified abnormal findings of blood chemistry: Secondary | ICD-10-CM | POA: Diagnosis not present

## 2018-10-06 DIAGNOSIS — I4821 Permanent atrial fibrillation: Secondary | ICD-10-CM | POA: Diagnosis present

## 2018-10-06 DIAGNOSIS — M7989 Other specified soft tissue disorders: Secondary | ICD-10-CM | POA: Diagnosis not present

## 2018-10-06 DIAGNOSIS — R531 Weakness: Secondary | ICD-10-CM | POA: Diagnosis not present

## 2018-10-06 DIAGNOSIS — Z90710 Acquired absence of both cervix and uterus: Secondary | ICD-10-CM

## 2018-10-06 DIAGNOSIS — I5032 Chronic diastolic (congestive) heart failure: Secondary | ICD-10-CM

## 2018-10-06 DIAGNOSIS — R52 Pain, unspecified: Secondary | ICD-10-CM | POA: Diagnosis not present

## 2018-10-06 DIAGNOSIS — R2689 Other abnormalities of gait and mobility: Secondary | ICD-10-CM | POA: Diagnosis not present

## 2018-10-06 DIAGNOSIS — I071 Rheumatic tricuspid insufficiency: Secondary | ICD-10-CM | POA: Diagnosis present

## 2018-10-06 DIAGNOSIS — Z87891 Personal history of nicotine dependence: Secondary | ICD-10-CM

## 2018-10-06 DIAGNOSIS — Z79899 Other long term (current) drug therapy: Secondary | ICD-10-CM

## 2018-10-06 DIAGNOSIS — R0600 Dyspnea, unspecified: Secondary | ICD-10-CM

## 2018-10-06 DIAGNOSIS — Z4789 Encounter for other orthopedic aftercare: Secondary | ICD-10-CM | POA: Diagnosis not present

## 2018-10-06 DIAGNOSIS — I5040 Unspecified combined systolic (congestive) and diastolic (congestive) heart failure: Secondary | ICD-10-CM | POA: Diagnosis not present

## 2018-10-06 DIAGNOSIS — E7800 Pure hypercholesterolemia, unspecified: Secondary | ICD-10-CM | POA: Diagnosis present

## 2018-10-06 LAB — CBC WITH DIFFERENTIAL/PLATELET
Abs Immature Granulocytes: 0.05 10*3/uL (ref 0.00–0.07)
Basophils Absolute: 0 10*3/uL (ref 0.0–0.1)
Basophils Relative: 0 %
Eosinophils Absolute: 0 10*3/uL (ref 0.0–0.5)
Eosinophils Relative: 0 %
HCT: 29.6 % — ABNORMAL LOW (ref 36.0–46.0)
Hemoglobin: 8 g/dL — ABNORMAL LOW (ref 12.0–15.0)
Immature Granulocytes: 1 %
Lymphocytes Relative: 5 %
Lymphs Abs: 0.4 10*3/uL — ABNORMAL LOW (ref 0.7–4.0)
MCH: 17.8 pg — ABNORMAL LOW (ref 26.0–34.0)
MCHC: 27 g/dL — ABNORMAL LOW (ref 30.0–36.0)
MCV: 65.9 fL — ABNORMAL LOW (ref 80.0–100.0)
Monocytes Absolute: 0.4 10*3/uL (ref 0.1–1.0)
Monocytes Relative: 5 %
Neutro Abs: 6.6 10*3/uL (ref 1.7–7.7)
Neutrophils Relative %: 89 %
Platelets: 299 10*3/uL (ref 150–400)
RBC: 4.49 MIL/uL (ref 3.87–5.11)
RDW: 20.9 % — ABNORMAL HIGH (ref 11.5–15.5)
WBC: 7.4 10*3/uL (ref 4.0–10.5)
nRBC: 0 % (ref 0.0–0.2)

## 2018-10-06 LAB — BASIC METABOLIC PANEL
Anion gap: 10 (ref 5–15)
BUN: 17 mg/dL (ref 8–23)
CO2: 31 mmol/L (ref 22–32)
Calcium: 8.7 mg/dL — ABNORMAL LOW (ref 8.9–10.3)
Chloride: 96 mmol/L — ABNORMAL LOW (ref 98–111)
Creatinine, Ser: 1.26 mg/dL — ABNORMAL HIGH (ref 0.44–1.00)
GFR calc Af Amer: 46 mL/min — ABNORMAL LOW (ref >60)
GFR calc non Af Amer: 40 mL/min — ABNORMAL LOW (ref >60)
Glucose, Bld: 139 mg/dL — ABNORMAL HIGH (ref 70–99)
Potassium: 2.8 mmol/L — ABNORMAL LOW (ref 3.5–5.1)
Sodium: 137 mmol/L (ref 135–145)

## 2018-10-06 LAB — URINE CULTURE
MICRO NUMBER:: 523900
SPECIMEN QUALITY:: ADEQUATE

## 2018-10-06 LAB — SARS CORONAVIRUS 2 BY RT PCR (HOSPITAL ORDER, PERFORMED IN ~~LOC~~ HOSPITAL LAB): SARS Coronavirus 2: NEGATIVE

## 2018-10-06 LAB — MAGNESIUM: Magnesium: 1.9 mg/dL (ref 1.7–2.4)

## 2018-10-06 LAB — PROTIME-INR
INR: 1.8 — ABNORMAL HIGH (ref 0.8–1.2)
Prothrombin Time: 20.4 seconds — ABNORMAL HIGH (ref 11.4–15.2)

## 2018-10-06 MED ORDER — MAGNESIUM SULFATE 2 GM/50ML IV SOLN
2.0000 g | INTRAVENOUS | Status: AC
  Administered 2018-10-07: 2 g via INTRAVENOUS
  Filled 2018-10-06: qty 50

## 2018-10-06 MED ORDER — ONDANSETRON HCL 4 MG/2ML IJ SOLN
4.0000 mg | Freq: Four times a day (QID) | INTRAMUSCULAR | Status: DC | PRN
  Administered 2018-10-06 – 2018-10-08 (×2): 4 mg via INTRAVENOUS
  Filled 2018-10-06 (×3): qty 2

## 2018-10-06 MED ORDER — POTASSIUM CHLORIDE 10 MEQ/100ML IV SOLN
10.0000 meq | INTRAVENOUS | Status: AC
  Administered 2018-10-07 (×3): 10 meq via INTRAVENOUS
  Filled 2018-10-06 (×3): qty 100

## 2018-10-06 MED ORDER — FUROSEMIDE 10 MG/ML IJ SOLN
40.0000 mg | Freq: Once | INTRAMUSCULAR | Status: AC
  Administered 2018-10-07: 40 mg via INTRAVENOUS
  Filled 2018-10-06: qty 4

## 2018-10-06 MED ORDER — FENTANYL CITRATE (PF) 100 MCG/2ML IJ SOLN
12.5000 ug | INTRAMUSCULAR | Status: AC | PRN
  Administered 2018-10-06 – 2018-10-07 (×2): 12.5 ug via INTRAVENOUS
  Filled 2018-10-06 (×2): qty 2

## 2018-10-06 NOTE — ED Provider Notes (Addendum)
[facility] DEPT Provider Note   CSN: [id number] Arrival date & time: 10/06/18  2126    History   Chief Complaint Chief Complaint  Patient presents with  . Fall  . Hip Pain    HPI [name] is a 85 y.o. female with a hx of HOCM (s/p ICD), chronic a-fib, COPD, peripheral edema, HTN, mitral valve prolapse, TIA, PAD, chronic anticoagulation, recurrent UTI presents to the Emergency Department after acute fall.  Patient reports she lost her balance and fell onto her left hip.  She reports immediate pain.  She denies loss of consciousness or hitting her head.  She denies pain anywhere else on her body.  She does report chronic swelling of her legs but does not believe it is worse tonight.  She reports that after the fall she began to have shortness of breath.  Upon EMS arrival, they reported oxygen saturations in the 80s.  She was initially put on nasal cannula at 6 L/min with improvement and was able to be weaned down to 3 L/min.  Patient reports she still does feel short of breath.  She states she does not wear home oxygen.  She does take metolazone and torsemide at home for fluid retention.  She is followed by Dr. Caryl Comes for this.  Additionally, patient reports she takes Coumadin for her A. fib which is chronic.  Patient denies recent weight gain, recent shortness of breath, cough, fevers, abdominal pain.  She denies known sick contacts.  She reports she was having no difficulty breathing until after the fall.  Patient does report that yesterday she was seen via telehealth visit by her primary care physician for urinary tract infection.  She was prescribed Keflex which she began.  She reports she has not had her Keflex dosage tonight.  She reports dysuria, hematuria, urinary urgency and urinary frequency with this.  She denies fevers or chills, headache, neck pain, chest pain, vomiting, diarrhea, syncope.  Also denies back pain, numbness.     The history is  provided by the patient and medical records. No language interpreter was used.  Fall  Associated symptoms include shortness of breath. Pertinent negatives include no chest pain, no abdominal pain and no headaches.  Hip Pain  Associated symptoms include shortness of breath. Pertinent negatives include no chest pain, no abdominal pain and no headaches.    Past Medical History:  Diagnosis Date  . Adjustment disorder with anxiety   . Anginal pain ([place])   . Arthritis    "some in my hands" (11/11/2012)  . Atrial fibrillation ([place])   . Automatic implantable cardioverter-defibrillator in situ   . Blood in stool   . Carotid artery stenosis 09/2007   60-79% bilateral (stable)  . Carotid artery stenosis 10/2008   40-59% R 60-79%   . Chest pain, unspecified   . Chronic airway obstruction, not elsewhere classified   . Chronic bronchitis ([place])    "get it some; not q year" (11/11/2012)  . Chronic diastolic CHF (congestive heart failure) ([place]) 06/04/2013  . Chronic kidney disease, unspecified   . Coronary artery disease    non-obstructive by 2006 cath  . Dysuria   . Edema   . Encounter for therapeutic drug monitoring   . Exertional shortness of breath   . Frequent UTI    "get them a couple times/yr" (11/11/2012)  . Hemorrhage of rectum and anus   . High cholesterol   . History of blood transfusion 04/2011   "after hip  OR" (11/11/2012)  . Hypertension 05/20/2011  . Hypertr obst cardiomyop   . Hypotension, unspecified    cardiac cath 2006..nonobstructive CAD 30-40s lesions.Marland KitchenETT 1/09 nondiagnostic due to poor HR response..Right Renal Cancer 2003  . Long term (current) use of anticoagulants   . Malignant neoplasm of kidney, except pelvis   . Osteoarthritis of right hip   . Other acute sinusitis   . Other and unspecified coagulation defects   . Other malaise and fatigue   . Other screening mammogram   . PONV (postoperative nausea and vomiting)   . Pre-operative cardiovascular examination   . Renal  cancer ([place]) 06/2001   Right  . Secondary cardiomyopathy, unspecified   . Sinus bradycardia   . Special screening for osteoporosis   . Urge incontinence     Patient Active Problem List   Diagnosis Date Noted  . Afib ([place]) 06/01/2018  . Urinary frequency 09/18/2017  . Iliac artery stenosis, bilateral ([place]) 07/14/2017  . Recurrent UTI 07/14/2017  . Long term (current) use of anticoagulants 05/08/2017  . Microcytic anemia 11/18/2016  . Dysuria 11/18/2016  . Pulmonary hypertension ([place]) 12/04/2015  . B12 deficiency 05/22/2015  . PAD (peripheral artery disease) ([place])   . Elevated serum creatinine   . Renal cell carcinoma ([place])   . S/p nephrectomy   . TIA (transient ischemic attack) 08/16/2014  . Weakness 01/14/2014  . Chronic kidney disease (CKD), stage IV (severe) ([place]) 12/08/2013  . Encounter for long-term (current) use of antiplatelets/antithrombotics 06/07/2013  . Chronic diastolic CHF (congestive heart failure) ([place]) 06/04/2013  . Mitral valve regurgitation 06/04/2013  . Allergic rhinitis 01/01/2013  . Chronic cholecystitis with calculus 09/15/2012  . Claudication ([place]) 01/21/2012  . Dizziness 11/26/2011  . Hypertension 05/20/2011  . Avascular necrosis of hip ([place]) 05/03/2011  . OTHER AND UNSPECIFIED COAGULATION DEFECTS 10/13/2009  . RECTAL BLEEDING 10/13/2009  . HOCM / IHSS 07/14/2009  . EDEMA 07/14/2009  . Hyperlipidemia 04/10/2009  . ADJUSTMENT DISORDER WITH ANXIETY 04/10/2009  . INCONTINENCE, URGE 04/10/2009  . Atrial fibrillation ([place]) 10/27/2008  . SINUS BRADYCARDIA 10/27/2008  . Carotid artery stenosis 10/27/2008  . COPD (chronic obstructive pulmonary disease) ([place]) 10/27/2008  . Implantable cardioverter-defibrillator (ICD) in situ 10/27/2008    Past Surgical History:  Procedure Laterality Date  . ABDOMINAL HYSTERECTOMY  1975   for benign causes  . APPENDECTOMY    . BI-VENTRICULAR PACEMAKER UPGRADE  05/04/2010  . CARDIAC CATHETERIZATION  2006  .  CARDIOVERSION N/A 02/20/2018   Procedure: CARDIOVERSION;  Surgeon: Minna Merritts, MD;  Location: [facility];  Service: Cardiovascular;  Laterality: N/A;  . CARDIOVERSION N/A 03/27/2018   Procedure: CARDIOVERSION;  Surgeon: Minna Merritts, MD;  Location: [facility];  Service: Cardiovascular;  Laterality: N/A;  . CATARACT EXTRACTION W/ INTRAOCULAR LENS  IMPLANT, BILATERAL  01/2006-02-2006  . CHOLECYSTECTOMY N/A 11/11/2012   Procedure: LAPAROSCOPIC CHOLECYSTECTOMY WITH INTRAOPERATIVE CHOLANGIOGRAM;  Surgeon: Imogene Burn. Georgette Dover, MD;  Location: [facility];  Service: General;  Laterality: N/A;  . EP IMPLANTABLE DEVICE N/A 02/21/2016   Procedure: ICD Generator Changeout;  Surgeon: Deboraha Sprang, MD;  Location: [facility];  Service: Cardiovascular;  Laterality: N/A;  . INSERT / REPLACE / REMOVE PACEMAKER  05-01-11   02-28-05-/05-04-10-ICD-MEDTRONIC MAXIMAL DR  . JOINT REPLACEMENT    . LAPAROSCOPIC CHOLECYSTECTOMY  11/11/2012  . LAPAROSCOPIC LYSIS OF ADHESIONS N/A 11/11/2012   Procedure: LAPAROSCOPIC LYSIS OF ADHESIONS;  Surgeon: Imogene Burn. Georgette Dover, MD;  Location: [facility];  Service: General;  Laterality: N/A;  .  NEPHRECTOMY Right 06/2001    S/P RENAL CELL CANCER  . TOTAL HIP ARTHROPLASTY Right 05/03/2011   Procedure: TOTAL HIP ARTHROPLASTY ANTERIOR APPROACH;  Surgeon: Mcarthur Rossetti;  Location: [facility];  Service: Orthopedics;  Laterality: Right;  Removal of Cannulated Screws Right Hip, Right Direct Anterior Hip Replacement     OB History   No obstetric history on file.      Home Medications    Prior to Admission medications   Medication Sig Start Date End Date Taking? Authorizing Provider  cephALEXin (KEFLEX) 500 MG capsule Take 1 capsule (500 mg total) by mouth every 12 (twelve) hours. 10/05/18  Yes Elby Beck, FNP  conjugated estrogens (PREMARIN) vaginal cream Place 1 Applicatorful vaginally daily. 07/31/18  Yes Elby Beck, FNP  ezetimibe (ZETIA) 10 MG tablet Take 1 tablet (10  mg total) by mouth daily. 08/18/17  Yes Gollan, Kathlene November, MD  fluticasone (FLONASE) 50 MCG/ACT nasal spray Place 2 sprays into both nostrils daily. 04/22/18  Yes Elby Beck, FNP  levothyroxine (SYNTHROID) 25 MCG tablet Take 1 tablet (25 mcg total) by mouth daily before breakfast. 09/22/18  Yes Deboraha Sprang, MD  magnesium oxide (MAG-OX) 400 MG tablet Take 1 tablet (400 mg total) by mouth daily. 06/05/18  Yes Deboraha Sprang, MD  metolazone (ZAROXOLYN) 5 MG tablet Take 1 tablet (5 mg total) once a week. Patient taking differently: Take 5 mg by mouth See admin instructions. Take 5 mg twice a week 08/25/18  Yes Deboraha Sprang, MD  potassium chloride SA (K-DUR) 20 MEQ tablet Take 1 tablet (20 meq) by mouth twice daily, except on metolazone days take 2 tablets (40 meq) twice daily 10/02/18  Yes Deboraha Sprang, MD  rosuvastatin (CRESTOR) 40 MG tablet Take 1 tablet (40 mg total) by mouth daily. 11/11/16  Yes Minna Merritts, MD  torsemide (DEMADEX) 20 MG tablet Take 2 tablets (40 mg) by mouth once daily. 08/25/18  Yes Deboraha Sprang, MD  acetaminophen (TYLENOL) 500 MG tablet Take 1,000 mg by mouth daily as needed for headache.     [provider]  cetirizine (ZYRTEC) 10 MG tablet Take 10 mg by mouth daily.     [provider]  CRANBERRY PO Take 4 tablets by mouth daily.    [provider]  hydrocortisone 2.5 % cream Apply 1 application topically daily as needed (itching).  02/04/18   [provider]  spironolactone (ALDACTONE) 25 MG tablet Take 0.5 tablets (12.5 mg total) by mouth daily. Patient not taking: Reported on 10/06/2018 08/06/18 11/04/18  Deboraha Sprang, MD  warfarin (COUMADIN) 2 MG tablet Take 1 1/2 tablets daily or AS DIRECTED BY ANTICOAGULATION CLINIC. 12/29/17   Elby Beck, FNP    Family History Family History  Problem Relation Age of Onset  . Heart failure Mother   . Breast cancer Maternal Aunt 82  . Breast cancer Cousin   . Breast cancer  Other   . Colon cancer Neg Hx     Social History Social History   Tobacco Use  . Smoking status: Former Smoker    Packs/day: 0.50    Years: 40.00    Pack years: 20.00    Types: Cigarettes    Last attempt to quit: 05/06/2001    Years since quitting: 17.4  . Smokeless tobacco: Never Used  Substance Use Topics  . Alcohol use: No    Alcohol/week: 0.0 standard drinks  . Drug use: No  Allergies   Codeine; Morphine and related; and Sulfonamide derivatives   Review of Systems Review of Systems  Constitutional: Negative for appetite change, diaphoresis, fatigue, fever and unexpected weight change.  HENT: Negative for mouth sores.   Eyes: Negative for visual disturbance.  Respiratory: Positive for shortness of breath. Negative for cough, chest tightness and wheezing.   Cardiovascular: Negative for chest pain.  Gastrointestinal: Negative for abdominal pain, constipation, diarrhea, nausea and vomiting.  Endocrine: Negative for polydipsia, polyphagia and polyuria.  Genitourinary: Positive for dysuria, frequency, hematuria and urgency.  Musculoskeletal: Positive for arthralgias ( left hip). Negative for back pain and neck stiffness.  Skin: Negative for rash.  Allergic/Immunologic: Negative for immunocompromised state.  Neurological: Negative for syncope, light-headedness and headaches.  Hematological: Does not bruise/bleed easily.  Psychiatric/Behavioral: Negative for sleep disturbance. The patient is not nervous/anxious.      Physical Exam Updated Vital Signs BP 112/87   Pulse 95   Temp 97.9 F (36.6 C) (Oral)   Resp (!) 21   Ht 5\' 6"  (1.676 m)   Wt 78 kg   SpO2 96%   BMI 27.76 kg/m   Physical Exam Vitals signs and nursing note reviewed.  Constitutional:      General: She is not in acute distress.    Appearance: She is not diaphoretic.  HENT:     Head: Normocephalic.  Eyes:     General: No scleral icterus.    Conjunctiva/sclera: Conjunctivae normal.  Neck:      Musculoskeletal: Normal range of motion.  Cardiovascular:     Rate and Rhythm: Normal rate and regular rhythm.     Pulses: Normal pulses.          Radial pulses are 2+ on the right side and 2+ on the left side.  Pulmonary:     Effort: Tachypnea and accessory muscle usage present. No prolonged expiration, respiratory distress or retractions.     Breath sounds: No stridor. Examination of the right-lower field reveals rales. Examination of the left-lower field reveals rales. Rales present.     Comments: Equal chest rise. Moderate increased work of breathing with some assessor muscle usage. Abdominal:     General: There is no distension.     Palpations: Abdomen is soft.     Tenderness: There is no abdominal tenderness. There is no guarding or rebound.  Musculoskeletal:     Left hip: She exhibits decreased range of motion, decreased strength, tenderness and bony tenderness.     Right lower leg: Edema ( 2+ pitting) present.     Left lower leg: Edema ( 2+ pitting) present.       Legs:  Skin:    General: Skin is warm and dry.     Capillary Refill: Capillary refill takes less than 2 seconds.  Neurological:     Mental Status: She is alert.     GCS: GCS eye subscore is 4. GCS verbal subscore is 5. GCS motor subscore is 6.     Comments: Speech is clear and goal oriented.  Psychiatric:        Mood and Affect: Mood normal.      ED Treatments / Results  Labs (all labs ordered are listed, but only abnormal results are displayed) Labs Reviewed  BASIC METABOLIC PANEL - Abnormal; Notable for the following components:      Result Value   Potassium 2.8 (*)    Chloride 96 (*)    Glucose, Bld 139 (*)    Creatinine, Ser 1.26 (*)  Calcium 8.7 (*)    GFR calc non Af Amer 40 (*)    GFR calc Af Amer 46 (*)    All other components within normal limits  CBC WITH DIFFERENTIAL/PLATELET - Abnormal; Notable for the following components:   Hemoglobin 8.0 (*)    HCT 29.6 (*)    MCV 65.9 (*)    MCH  17.8 (*)    MCHC 27.0 (*)    RDW 20.9 (*)    Lymphs Abs 0.4 (*)    All other components within normal limits  PROTIME-INR - Abnormal; Notable for the following components:   Prothrombin Time 20.4 (*)    INR 1.8 (*)    All other components within normal limits  BRAIN NATRIURETIC PEPTIDE - Abnormal; Notable for the following components:   B Natriuretic Peptide 235.7 (*)    All other components within normal limits  SARS CORONAVIRUS 2 (HOSPITAL ORDER, [facility])  URINE CULTURE  MAGNESIUM  URINALYSIS, ROUTINE W REFLEX MICROSCOPIC  D-DIMER, QUANTITATIVE (NOT AT [facility])  TYPE AND SCREEN    EKG Vent. rate 91 BPM PR interval * ms QRS duration 142 ms QT/QTc 426/525 ms P-R-T axes * -79 101 Atrial fibrillation Nonspecific IVCD with LAD LVH with secondary repolarization abnormality Anterior infarct, old Baseline wander in lead(s) V1 * Reviewed by Dr. Vanita Panda  Radiology Dg Chest 1 View  Result Date: 10/06/2018 CLINICAL DATA:  Hip fracture EXAM: CHEST  1 VIEW COMPARISON:  05/12/2018 FINDINGS: Unchanged position of left chest wall AICD leads. Mild cardiomegaly with hazy lower lobe predominant airspace opacities, possibly mild pulmonary edema. Limited assessment of left lung base due to positioning., but there is some degree of retrocardiac opacity. IMPRESSION: Mild cardiomegaly and mild pulmonary edema. Limited assessment of left lung base due to positioning, but some degree of retrocardiac opacity. Lateral view would be helpful for further evaluation if necessary. Electronically Signed   By: Ulyses Jarred M.D.   On: 10/06/2018 22:20   Dg Hip Unilat With Pelvis 2-3 Views Left  Result Date: 10/06/2018 CLINICAL DATA:  Fall EXAM: DG HIP (WITH OR WITHOUT PELVIS) 2-3V LEFT COMPARISON:  None. FINDINGS: Minimally displaced fracture of the left femoral neck with foreshortening. Femoral head remains situated in the acetabulum. Normal appearance of right total hip  arthroplasty. No pelvic ring fracture. IMPRESSION: Minimally displaced left femoral neck fracture with foreshortening. Electronically Signed   By: Ulyses Jarred M.D.   On: 10/06/2018 22:18    Procedures Procedures (including critical care time)  Medications Ordered in ED Medications  ondansetron (ZOFRAN) injection 4 mg (4 mg Intravenous Given 10/06/18 2250)  potassium chloride 10 mEq in 100 mL IVPB (has no administration in time range)  magnesium sulfate IVPB 2 g 50 mL (2 g Intravenous New Bag/Given 10/07/18 0043)  furosemide (LASIX) injection 40 mg (has no administration in time range)  fentaNYL (SUBLIMAZE) injection 12.5 mcg (12.5 mcg Intravenous Given 10/07/18 0005)     Initial Impression / Assessment and Plan / ED Course  I have reviewed the triage vital signs and the nursing notes.  Pertinent labs & imaging results that were available during my care of the patient were reviewed by me and considered in my medical decision making (see chart for details).  Clinical Course as of Oct 07 43  Tue Oct 06, 2018  2237 tachypnea  Resp(!): 21 [HM]  2237 Requiring 3L via [place] to maintain oxygen saturations above 90%  SpO2: 92 % [HM]  2238 Pt reports  pain is a 8/10 lying still.  Will order additional pain control.   [HM]  2239 [name] (daughter) [phone]   [HM]  2301 Slightly lower than normal  Hemoglobin(!): 8.0 [HM]  2302 hypokalemia  Potassium(!): 2.8 [HM]  2318 baseline  Creatinine(!): 1.26 [HM]  2332 Discussed with Dr. Doren Custard who will add to his consult list.     [HM]  Wed Oct 07, 2018  0009 Elevated, unknown baseline  B Natriuretic Peptide(!): 235.7 [HM]  0009 SARS Coronavirus 2: NEGATIVE [HM]  0037 Discussed with Dr. Ara Kussmaul who will evaluate and admit.    [HM]  0040 Discussed with daughter [name] and updated - she reports several falls in the last few weeks due to "leg heaviness." Pt does not use oxygen at home.    [HM]    Clinical Course User Index [HM] Lynzi Meulemans,  Gwenlyn Perking       Patient presents after mechanical fall.  Physical exam with rotation and shortening of the left leg.  X-ray with femoral neck fracture.  I personally evaluated these images.  Patient denies hitting her head.  She has no altered level of consciousness at this time.  She denies vision changes.  She is anticoagulated.  Additionally, patient found to be in respiratory distress on EMS arrival with sats in the 80s.  She is requiring oxygen at this time to keep her saturations within the 90s.  Chest x-ray with bilateral infiltrates.  Question CHF exacerbation versus COVID.  Testing pending.  Patient will be given Lasix here in the emergency department.  She is without hypotension.  Patient also found to be severely hypokalemic.  IV potassium ordered along with IV magnesium.  EKG without acute ischemia.  The patient was discussed with and seen by Dr. Vanita Panda who agrees with the treatment plan.  Discussed with Dr. Doren Custard, of ortho who will join the care team for repair discussion.    [facility] to admit.    Final Clinical Impressions(s) / ED Diagnoses   Final diagnoses:  Closed fracture of left hip, initial encounter ([place])  Shortness of breath  Hypokalemia  Anemia, unspecified type    ED Discharge Orders    None         Wilford Merryfield, Gwenlyn Perking 10/07/18 Lawrence Marseilles, MD 10/07/18 2232

## 2018-10-06 NOTE — ED Notes (Signed)
Pt to xray

## 2018-10-06 NOTE — ED Triage Notes (Signed)
Pt transported from home via [facility]. EMS reports the following:  Witnessed fall, mechanical, fell walking up some steps, missed a step. No LOC, no neck or back pain, did not hit head. Some respiratory distress on scene. Hx of respiratory distress, afib. Anxiety on scene O2 in 80s.Initally, put on 6 L. Now on 3L and o2 on 94%. Clear lung sounds. Doesn't wear oxygen at home. On blood thinners for afib. Currently on ABX for UTI.   C/O left hip pain, shortening of left leg and outward rotation. No instability or crepitus in left hip. Good PMS in left extremity.

## 2018-10-06 NOTE — ED Notes (Signed)
Bed: WA20 Expected date:  Expected time:  Means of arrival:  Comments: EMS 85 yo female from home/fall-left hip pain-on blood thinners-no rotation-hx atrial fib-fentanyl and zofran-resp distress/6l/ solumedrol

## 2018-10-06 NOTE — ED Notes (Signed)
Pt asked me to call her granddaughter with update on her medical condition.

## 2018-10-06 NOTE — Progress Notes (Signed)
EPIC Encounter for ICM Monitoring  Patient Name: [name] is a 85 y.o. female Date: 10/06/2018 Primary Care Physican: Elby Beck, [place] Primary Cardiologist: Rockey Situ Electrophysiologist: Caryl Comes 3/24/2020Weight:172 lbs 09/02/2018 Weight: 173.2 lbs 10/06/2018 Weight: 171 lbs  Clinical Status (21-Sep-2018 to 05-Oct-2018)  AT/AF1 episode  Time in AT/AF 24.0 hr/day (100.0%)  Longest AT/AF    109 days  Observations (2) (21-Sep-2018 to 05-Oct-2018)  AT/AF >= 6 hr for 14 days.  Patient Activity less than 1 hr/day for 2 weeks.  Heart failure questions reviewed and pt reports her leg swelling decreasing by 50% over night but it returns as soon she is ambulatory for about an hour.  She said her breathing is at baseline.    Optivol thoracic impedance normal for the last few days after Dr Caryl Comes recommended she take Metolazone twice last week.  Prescribed:Torsemide 20 mgTake 2 tablet (40 mg) by mouth daily. Klor Con 20 mEq Take 1 tablet (20 meq) by mouth twice daily, except on metolazone days take 2 tablets (40 meq) twice daily Metolazone 5 mgTake 1 tablet (5 mg total) by mouth once a week.  Labs: 06/12/2018 Creatinine1.50, BUN17, Potassium3.5, Sodium139, [id number] 06/08/2018 Creatinine1.40, BUN21, Potassium4.0, [id number], [id number]  06/04/2018 Creatinine1.61, BUN21, Potassium4.1, Sodium139, [id number]  06/03/2018 Creatinine1.53, BUN19, Potassium4.2, [id number], [id number] 06/02/2018 Creatinine1.47, BUN20, Potassium4.2, Sodium140, [id number] 06/01/2018 Creatinine1.51, BUN20, Potassium4.4, Sodium138, [id number]  05/19/2018 Creatinine1.43, BUN15, Potassium4.4, [id number], [id number]  A complete set of results can be found in Results Review.  Recommendations:No changes and advised to call if condition worsens.   Follow-up plan: ICM clinic phone appointment on7/10/2018.   Copy of ICM check sent to [place].   3 month ICM trend:  10/05/2018    1 Year ICM trend:       Rosalene Billings, RN 10/06/2018 2:05 PM

## 2018-10-07 ENCOUNTER — Inpatient Hospital Stay (HOSPITAL_COMMUNITY): Payer: PPO

## 2018-10-07 ENCOUNTER — Encounter (HOSPITAL_COMMUNITY): Payer: Self-pay

## 2018-10-07 ENCOUNTER — Telehealth: Payer: Self-pay | Admitting: Internal Medicine

## 2018-10-07 DIAGNOSIS — Z882 Allergy status to sulfonamides status: Secondary | ICD-10-CM | POA: Diagnosis not present

## 2018-10-07 DIAGNOSIS — M7989 Other specified soft tissue disorders: Secondary | ICD-10-CM

## 2018-10-07 DIAGNOSIS — I4821 Permanent atrial fibrillation: Secondary | ICD-10-CM | POA: Diagnosis present

## 2018-10-07 DIAGNOSIS — I130 Hypertensive heart and chronic kidney disease with heart failure and stage 1 through stage 4 chronic kidney disease, or unspecified chronic kidney disease: Secondary | ICD-10-CM | POA: Diagnosis present

## 2018-10-07 DIAGNOSIS — R400 Somnolence: Secondary | ICD-10-CM | POA: Diagnosis not present

## 2018-10-07 DIAGNOSIS — Z885 Allergy status to narcotic agent status: Secondary | ICD-10-CM | POA: Diagnosis not present

## 2018-10-07 DIAGNOSIS — F329 Major depressive disorder, single episode, unspecified: Secondary | ICD-10-CM | POA: Diagnosis present

## 2018-10-07 DIAGNOSIS — Z87440 Personal history of urinary (tract) infections: Secondary | ICD-10-CM | POA: Diagnosis not present

## 2018-10-07 DIAGNOSIS — J9601 Acute respiratory failure with hypoxia: Secondary | ICD-10-CM | POA: Diagnosis present

## 2018-10-07 DIAGNOSIS — Z85528 Personal history of other malignant neoplasm of kidney: Secondary | ICD-10-CM | POA: Diagnosis not present

## 2018-10-07 DIAGNOSIS — S72002A Fracture of unspecified part of neck of left femur, initial encounter for closed fracture: Secondary | ICD-10-CM | POA: Diagnosis present

## 2018-10-07 DIAGNOSIS — I5033 Acute on chronic diastolic (congestive) heart failure: Secondary | ICD-10-CM | POA: Diagnosis present

## 2018-10-07 DIAGNOSIS — E876 Hypokalemia: Secondary | ICD-10-CM | POA: Diagnosis present

## 2018-10-07 DIAGNOSIS — W010XXA Fall on same level from slipping, tripping and stumbling without subsequent striking against object, initial encounter: Secondary | ICD-10-CM | POA: Diagnosis present

## 2018-10-07 DIAGNOSIS — N184 Chronic kidney disease, stage 4 (severe): Secondary | ICD-10-CM | POA: Diagnosis present

## 2018-10-07 DIAGNOSIS — M19042 Primary osteoarthritis, left hand: Secondary | ICD-10-CM | POA: Diagnosis present

## 2018-10-07 DIAGNOSIS — E785 Hyperlipidemia, unspecified: Secondary | ICD-10-CM | POA: Diagnosis present

## 2018-10-07 DIAGNOSIS — W19XXXA Unspecified fall, initial encounter: Secondary | ICD-10-CM

## 2018-10-07 DIAGNOSIS — D649 Anemia, unspecified: Secondary | ICD-10-CM | POA: Diagnosis not present

## 2018-10-07 DIAGNOSIS — I2510 Atherosclerotic heart disease of native coronary artery without angina pectoris: Secondary | ICD-10-CM | POA: Diagnosis present

## 2018-10-07 DIAGNOSIS — N390 Urinary tract infection, site not specified: Secondary | ICD-10-CM | POA: Diagnosis present

## 2018-10-07 DIAGNOSIS — Z01810 Encounter for preprocedural cardiovascular examination: Secondary | ICD-10-CM

## 2018-10-07 DIAGNOSIS — I272 Pulmonary hypertension, unspecified: Secondary | ICD-10-CM | POA: Diagnosis present

## 2018-10-07 DIAGNOSIS — R0602 Shortness of breath: Secondary | ICD-10-CM | POA: Diagnosis present

## 2018-10-07 DIAGNOSIS — Z1159 Encounter for screening for other viral diseases: Secondary | ICD-10-CM | POA: Diagnosis not present

## 2018-10-07 DIAGNOSIS — Z905 Acquired absence of kidney: Secondary | ICD-10-CM | POA: Diagnosis not present

## 2018-10-07 DIAGNOSIS — Z66 Do not resuscitate: Secondary | ICD-10-CM | POA: Diagnosis present

## 2018-10-07 DIAGNOSIS — D631 Anemia in chronic kidney disease: Secondary | ICD-10-CM | POA: Diagnosis present

## 2018-10-07 DIAGNOSIS — Z8673 Personal history of transient ischemic attack (TIA), and cerebral infarction without residual deficits: Secondary | ICD-10-CM | POA: Diagnosis not present

## 2018-10-07 DIAGNOSIS — J9602 Acute respiratory failure with hypercapnia: Secondary | ICD-10-CM | POA: Diagnosis present

## 2018-10-07 DIAGNOSIS — S72012A Unspecified intracapsular fracture of left femur, initial encounter for closed fracture: Secondary | ICD-10-CM | POA: Diagnosis present

## 2018-10-07 DIAGNOSIS — Z7901 Long term (current) use of anticoagulants: Secondary | ICD-10-CM | POA: Diagnosis not present

## 2018-10-07 DIAGNOSIS — Y92239 Unspecified place in hospital as the place of occurrence of the external cause: Secondary | ICD-10-CM | POA: Diagnosis present

## 2018-10-07 LAB — CBC
HCT: 30.5 % — ABNORMAL LOW (ref 36.0–46.0)
Hemoglobin: 8.3 g/dL — ABNORMAL LOW (ref 12.0–15.0)
MCH: 17.8 pg — ABNORMAL LOW (ref 26.0–34.0)
MCHC: 27.2 g/dL — ABNORMAL LOW (ref 30.0–36.0)
MCV: 65.6 fL — ABNORMAL LOW (ref 80.0–100.0)
Platelets: 260 10*3/uL (ref 150–400)
RBC: 4.65 MIL/uL (ref 3.87–5.11)
RDW: 21 % — ABNORMAL HIGH (ref 11.5–15.5)
WBC: 5.6 10*3/uL (ref 4.0–10.5)
nRBC: 0 % (ref 0.0–0.2)

## 2018-10-07 LAB — BASIC METABOLIC PANEL
Anion gap: 10 (ref 5–15)
BUN: 16 mg/dL (ref 8–23)
CO2: 29 mmol/L (ref 22–32)
Calcium: 7.9 mg/dL — ABNORMAL LOW (ref 8.9–10.3)
Chloride: 95 mmol/L — ABNORMAL LOW (ref 98–111)
Creatinine, Ser: 1.2 mg/dL — ABNORMAL HIGH (ref 0.44–1.00)
GFR calc Af Amer: 49 mL/min — ABNORMAL LOW (ref >60)
GFR calc non Af Amer: 42 mL/min — ABNORMAL LOW (ref >60)
Glucose, Bld: 154 mg/dL — ABNORMAL HIGH (ref 70–99)
Potassium: 3.9 mmol/L (ref 3.5–5.1)
Sodium: 134 mmol/L — ABNORMAL LOW (ref 135–145)

## 2018-10-07 LAB — HEPARIN LEVEL (UNFRACTIONATED)
Heparin Unfractionated: 0.27 IU/mL — ABNORMAL LOW (ref 0.30–0.70)
Heparin Unfractionated: 0.62 IU/mL (ref 0.30–0.70)

## 2018-10-07 LAB — BLOOD GAS, ARTERIAL
Acid-Base Excess: 6.2 mmol/L — ABNORMAL HIGH (ref 0.0–2.0)
Bicarbonate: 32.9 mmol/L — ABNORMAL HIGH (ref 20.0–28.0)
Drawn by: 235321
O2 Content: 4 L/min
O2 Saturation: 96.8 %
Patient temperature: 37
pCO2 arterial: 66.9 mmHg (ref 32.0–48.0)
pH, Arterial: 7.313 — ABNORMAL LOW (ref 7.350–7.450)
pO2, Arterial: 97.4 mmHg (ref 83.0–108.0)

## 2018-10-07 LAB — URINALYSIS, ROUTINE W REFLEX MICROSCOPIC
Bilirubin Urine: NEGATIVE
Glucose, UA: NEGATIVE mg/dL
Hgb urine dipstick: NEGATIVE
Ketones, ur: NEGATIVE mg/dL
Leukocytes,Ua: NEGATIVE
Nitrite: NEGATIVE
Protein, ur: NEGATIVE mg/dL
Specific Gravity, Urine: 1.008 (ref 1.005–1.030)
pH: 6 (ref 5.0–8.0)

## 2018-10-07 LAB — URINE CULTURE: Culture: NO GROWTH

## 2018-10-07 LAB — BRAIN NATRIURETIC PEPTIDE: B Natriuretic Peptide: 235.7 pg/mL — ABNORMAL HIGH (ref 0.0–100.0)

## 2018-10-07 LAB — D-DIMER, QUANTITATIVE: D-Dimer, Quant: 16.06 ug/mL-FEU — ABNORMAL HIGH (ref 0.00–0.50)

## 2018-10-07 LAB — SURGICAL PCR SCREEN
MRSA, PCR: NEGATIVE
Staphylococcus aureus: NEGATIVE

## 2018-10-07 MED ORDER — IOHEXOL 350 MG/ML SOLN
100.0000 mL | Freq: Once | INTRAVENOUS | Status: AC | PRN
  Administered 2018-10-07: 80 mL via INTRAVENOUS

## 2018-10-07 MED ORDER — BISACODYL 5 MG PO TBEC: 5.0000 mg | DELAYED_RELEASE_TABLET | Freq: Every day | ORAL | Status: DC | PRN

## 2018-10-07 MED ORDER — FUROSEMIDE 10 MG/ML IJ SOLN: 40.0000 mg | Freq: Two times a day (BID) | INTRAMUSCULAR | Status: DC

## 2018-10-07 MED ORDER — ONDANSETRON HCL 4 MG/2ML IJ SOLN
4.0000 mg | Freq: Four times a day (QID) | INTRAMUSCULAR | Status: DC | PRN
  Filled 2018-10-07: qty 2

## 2018-10-07 MED ORDER — FUROSEMIDE 10 MG/ML IJ SOLN
40.0000 mg | Freq: Once | INTRAMUSCULAR | Status: AC
  Administered 2018-10-07: 40 mg via INTRAVENOUS
  Filled 2018-10-07: qty 4

## 2018-10-07 MED ORDER — FENTANYL CITRATE (PF) 100 MCG/2ML IJ SOLN
25.0000 ug | Freq: Once | INTRAMUSCULAR | Status: AC
  Administered 2018-10-07: 25 ug via INTRAVENOUS
  Filled 2018-10-07: qty 2

## 2018-10-07 MED ORDER — HEPARIN (PORCINE) 25000 UT/250ML-% IV SOLN
1200.0000 [IU]/h | INTRAVENOUS | Status: AC
  Administered 2018-10-07 – 2018-10-08 (×2): 1200 [IU]/h via INTRAVENOUS
  Filled 2018-10-07: qty 250

## 2018-10-07 MED ORDER — SENNOSIDES-DOCUSATE SODIUM 8.6-50 MG PO TABS: 1.0000 | ORAL_TABLET | Freq: Every evening | ORAL | Status: DC | PRN

## 2018-10-07 MED ORDER — ACETAMINOPHEN 650 MG RE SUPP: 650.0000 mg | Freq: Four times a day (QID) | RECTAL | Status: DC | PRN

## 2018-10-07 MED ORDER — MUPIROCIN 2 % EX OINT: 1.0000 "application " | TOPICAL_OINTMENT | Freq: Two times a day (BID) | CUTANEOUS | Status: DC

## 2018-10-07 MED ORDER — SODIUM CHLORIDE 0.9 % IV SOLN: INTRAVENOUS | Status: DC

## 2018-10-07 MED ORDER — MAGNESIUM CITRATE PO SOLN: 1.0000 | Freq: Once | ORAL | Status: DC | PRN

## 2018-10-07 MED ORDER — ROSUVASTATIN CALCIUM 20 MG PO TABS
40.0000 mg | ORAL_TABLET | Freq: Every day | ORAL | Status: DC
  Administered 2018-10-07 – 2018-10-12 (×5): 40 mg via ORAL
  Filled 2018-10-07 (×5): qty 2

## 2018-10-07 MED ORDER — IPRATROPIUM BROMIDE 0.02 % IN SOLN: 0.5000 mg | Freq: Four times a day (QID) | RESPIRATORY_TRACT | Status: DC | PRN

## 2018-10-07 MED ORDER — EZETIMIBE 10 MG PO TABS
10.0000 mg | ORAL_TABLET | Freq: Every day | ORAL | Status: DC
  Administered 2018-10-07 – 2018-10-12 (×5): 10 mg via ORAL
  Filled 2018-10-07 (×5): qty 1

## 2018-10-07 MED ORDER — FUROSEMIDE 10 MG/ML IJ SOLN
40.0000 mg | Freq: Every day | INTRAMUSCULAR | Status: DC
  Administered 2018-10-07: 40 mg via INTRAVENOUS
  Filled 2018-10-07: qty 4

## 2018-10-07 MED ORDER — MAGNESIUM OXIDE 400 (241.3 MG) MG PO TABS
400.0000 mg | ORAL_TABLET | Freq: Every day | ORAL | Status: DC
  Administered 2018-10-07 – 2018-10-12 (×5): 400 mg via ORAL
  Filled 2018-10-07 (×5): qty 1

## 2018-10-07 MED ORDER — SODIUM CHLORIDE (PF) 0.9 % IJ SOLN
INTRAMUSCULAR | Status: AC
  Filled 2018-10-07: qty 50

## 2018-10-07 MED ORDER — FUROSEMIDE 10 MG/ML IJ SOLN: 40.0000 mg | Freq: Every day | INTRAMUSCULAR | Status: DC

## 2018-10-07 MED ORDER — ONDANSETRON HCL 4 MG PO TABS: 4.0000 mg | ORAL_TABLET | Freq: Four times a day (QID) | ORAL | Status: DC | PRN

## 2018-10-07 MED ORDER — NALOXONE HCL 0.4 MG/ML IJ SOLN
INTRAMUSCULAR | Status: AC
  Filled 2018-10-07: qty 1

## 2018-10-07 MED ORDER — HYDROMORPHONE HCL 1 MG/ML IJ SOLN
0.5000 mg | INTRAMUSCULAR | Status: DC | PRN
  Administered 2018-10-07 (×4): 0.5 mg via INTRAVENOUS
  Filled 2018-10-07 (×4): qty 0.5

## 2018-10-07 MED ORDER — LEVOTHYROXINE SODIUM 25 MCG PO TABS
25.0000 ug | ORAL_TABLET | Freq: Every day | ORAL | Status: DC
  Administered 2018-10-07 – 2018-10-12 (×6): 25 ug via ORAL
  Filled 2018-10-07 (×6): qty 1

## 2018-10-07 MED ORDER — LORATADINE 10 MG PO TABS
10.0000 mg | ORAL_TABLET | Freq: Every day | ORAL | Status: DC
  Administered 2018-10-07 – 2018-10-12 (×5): 10 mg via ORAL
  Filled 2018-10-07 (×5): qty 1

## 2018-10-07 MED ORDER — CEPHALEXIN 500 MG PO CAPS
500.0000 mg | ORAL_CAPSULE | Freq: Two times a day (BID) | ORAL | Status: DC
  Administered 2018-10-07 – 2018-10-08 (×5): 500 mg via ORAL
  Filled 2018-10-07 (×5): qty 1

## 2018-10-07 MED ORDER — FUROSEMIDE 10 MG/ML IJ SOLN
40.0000 mg | Freq: Two times a day (BID) | INTRAMUSCULAR | Status: DC
  Administered 2018-10-08 – 2018-10-11 (×6): 40 mg via INTRAVENOUS
  Filled 2018-10-07 (×6): qty 4

## 2018-10-07 MED ORDER — NALOXONE HCL 0.4 MG/ML IJ SOLN
0.4000 mg | INTRAMUSCULAR | Status: DC | PRN
  Administered 2018-10-07: 0.4 mg via INTRAVENOUS

## 2018-10-07 MED ORDER — HEPARIN (PORCINE) 25000 UT/250ML-% IV SOLN
1050.0000 [IU]/h | INTRAVENOUS | Status: DC
  Administered 2018-10-07: 1050 [IU]/h via INTRAVENOUS
  Filled 2018-10-07: qty 250

## 2018-10-07 MED ORDER — FLUTICASONE PROPIONATE 50 MCG/ACT NA SUSP
2.0000 | Freq: Every day | NASAL | Status: DC
  Filled 2018-10-07: qty 16

## 2018-10-07 MED ORDER — ALBUTEROL SULFATE (2.5 MG/3ML) 0.083% IN NEBU
2.5000 mg | INHALATION_SOLUTION | Freq: Four times a day (QID) | RESPIRATORY_TRACT | Status: DC | PRN
  Filled 2018-10-07: qty 3

## 2018-10-07 MED ORDER — NALOXONE HCL 0.4 MG/ML IJ SOLN
0.4000 mg | Freq: Once | INTRAMUSCULAR | Status: AC
  Administered 2018-10-07: 0.4 mg via INTRAVENOUS

## 2018-10-07 MED ORDER — ACETAMINOPHEN 325 MG PO TABS
650.0000 mg | ORAL_TABLET | Freq: Four times a day (QID) | ORAL | Status: DC | PRN
  Administered 2018-10-10 – 2018-10-12 (×4): 650 mg via ORAL
  Filled 2018-10-07 (×4): qty 2

## 2018-10-07 NOTE — Progress Notes (Signed)
ANTICOAGULATION CONSULT NOTE - Follow Up Consult  Pharmacy Consult for Heparin Indication: Changing from Coumadin to heparin pending surgery.  Allergies  Allergen Reactions  . Codeine Nausea And Vomiting  . Morphine And Related Nausea And Vomiting  . Sulfonamide Derivatives Other (See Comments)    Dry mouth    Patient Measurements: Height: 5\' 6"  (167.6 cm) Weight: 169 lb 8.5 oz (76.9 kg) IBW/kg (Calculated) : 59.3 Heparin Dosing Weight:   Vital Signs: Temp: 98.5 F (36.9 C) (06/03 2143) Temp Source: Oral (06/03 2143) BP: 141/56 (06/03 2143) Pulse Rate: 70 (06/03 2143)  Labs: Recent Labs    10/06/18 2220 10/07/18 0539 10/07/18 1137 10/07/18 2145  HGB 8.0* 8.3*  --   --   HCT 29.6* 30.5*  --   --   PLT 299 260  --   --   LABPROT 20.4*  --   --   --   INR 1.8*  --   --   --   HEPARINUNFRC  --   --  0.27* 0.62  CREATININE 1.26* 1.20*  --   --     Estimated Creatinine Clearance: 37.8 mL/min (A) (by C-G formula based on SCr of 1.2 mg/dL (H)).   Medications:  Infusions:  . heparin 1,200 Units/hr (10/07/18 1409)    Assessment: Patient with heparin level at goal.  No heparin issues noted.  Goal of Therapy:  Heparin level 0.3-0.7 units/ml Monitor platelets by anticoagulation protocol: Yes   Plan:  Continue heparin drip at current rate Recheck level at 0600  Tyler Deis, Shea Stakes [place] 10/07/2018,10:52 PM

## 2018-10-07 NOTE — Telephone Encounter (Signed)
Pts granddaughter called to request that Dr. Caryl Comes could possibly get her in the hosp to see the pt tomorrow prior to her fractured hip surgery.. I advised her that under the COVID restrictions.. Dr. Caryl Comes would not be able to bring her into [facility] physically.. I explained to her that this is also to protect the pt and help [place] to keep her safe from the virus the best that we can. She verbalized understanding will let us know if there is anything else we can do.

## 2018-10-07 NOTE — Consult Note (Signed)
Reason for Consult:Left hip fracture Referring Physician: EDP  [name] is an 85 y.o. female.  HPI: 85 yo female with a history of frequent falls who presents with a ground level fall yesterday. She complains of severe left hip pain and was unable to stand after the fall.  She denies LOC or other complaints.  Past Medical History:  Diagnosis Date  . Adjustment disorder with anxiety   . Anginal pain ([place])   . Arthritis    "some in my hands" (11/11/2012)  . Atrial fibrillation ([place])   . Automatic implantable cardioverter-defibrillator in situ   . Blood in stool   . Carotid artery stenosis 09/2007   60-79% bilateral (stable)  . Carotid artery stenosis 10/2008   40-59% R 60-79%   . Chest pain, unspecified   . Chronic airway obstruction, not elsewhere classified   . Chronic bronchitis ([place])    "get it some; not q year" (11/11/2012)  . Chronic diastolic CHF (congestive heart failure) ([place]) 06/04/2013  . Chronic kidney disease, unspecified   . Coronary artery disease    non-obstructive by 2006 cath  . Dysuria   . Edema   . Encounter for therapeutic drug monitoring   . Exertional shortness of breath   . Frequent UTI    "get them a couple times/yr" (11/11/2012)  . Hemorrhage of rectum and anus   . High cholesterol   . History of blood transfusion 04/2011   "after hip OR" (11/11/2012)  . Hypertension 05/20/2011  . Hypertr obst cardiomyop   . Hypotension, unspecified    cardiac cath 2006..nonobstructive CAD 30-40s lesions.Marland KitchenETT 1/09 nondiagnostic due to poor HR response..Right Renal Cancer 2003  . Long term (current) use of anticoagulants   . Malignant neoplasm of kidney, except pelvis   . Osteoarthritis of right hip   . Other acute sinusitis   . Other and unspecified coagulation defects   . Other malaise and fatigue   . Other screening mammogram   . PONV (postoperative nausea and vomiting)   . Pre-operative cardiovascular examination   . Renal cancer ([place]) 06/2001   Right  .  Secondary cardiomyopathy, unspecified   . Sinus bradycardia   . Special screening for osteoporosis   . Urge incontinence     Past Surgical History:  Procedure Laterality Date  . ABDOMINAL HYSTERECTOMY  1975   for benign causes  . APPENDECTOMY    . BI-VENTRICULAR PACEMAKER UPGRADE  05/04/2010  . CARDIAC CATHETERIZATION  2006  . CARDIOVERSION N/A 02/20/2018   Procedure: CARDIOVERSION;  Surgeon: Minna Merritts, MD;  Location: [facility];  Service: Cardiovascular;  Laterality: N/A;  . CARDIOVERSION N/A 03/27/2018   Procedure: CARDIOVERSION;  Surgeon: Minna Merritts, MD;  Location: [facility];  Service: Cardiovascular;  Laterality: N/A;  . CATARACT EXTRACTION W/ INTRAOCULAR LENS  IMPLANT, BILATERAL  01/2006-02-2006  . CHOLECYSTECTOMY N/A 11/11/2012   Procedure: LAPAROSCOPIC CHOLECYSTECTOMY WITH INTRAOPERATIVE CHOLANGIOGRAM;  Surgeon: Imogene Burn. Georgette Dover, MD;  Location: [facility];  Service: General;  Laterality: N/A;  . EP IMPLANTABLE DEVICE N/A 02/21/2016   Procedure: ICD Generator Changeout;  Surgeon: Deboraha Sprang, MD;  Location: [facility];  Service: Cardiovascular;  Laterality: N/A;  . INSERT / REPLACE / REMOVE PACEMAKER  05-01-11   02-28-05-/05-04-10-ICD-MEDTRONIC MAXIMAL DR  . JOINT REPLACEMENT    . LAPAROSCOPIC CHOLECYSTECTOMY  11/11/2012  . LAPAROSCOPIC LYSIS OF ADHESIONS N/A 11/11/2012   Procedure: LAPAROSCOPIC LYSIS OF ADHESIONS;  Surgeon: Imogene Burn. Georgette Dover, MD;  Location: [facility];  Service:  General;  Laterality: N/A;  . NEPHRECTOMY Right 06/2001    S/P RENAL CELL CANCER  . TOTAL HIP ARTHROPLASTY Right 05/03/2011   Procedure: TOTAL HIP ARTHROPLASTY ANTERIOR APPROACH;  Surgeon: Mcarthur Rossetti;  Location: [facility];  Service: Orthopedics;  Laterality: Right;  Removal of Cannulated Screws Right Hip, Right Direct Anterior Hip Replacement    Family History  Problem Relation Age of Onset  . Heart failure Mother   . Breast cancer Maternal Aunt 82  . Breast cancer Cousin   . Breast  cancer Other   . Colon cancer Neg Hx     Social History:  reports that she quit smoking about 17 years ago. Her smoking use included cigarettes. She has a 20.00 pack-year smoking history. She has never used smokeless tobacco. She reports that she does not drink alcohol or use drugs.  Allergies:  Allergies  Allergen Reactions  . Codeine Nausea And Vomiting  . Morphine And Related Nausea And Vomiting  . Sulfonamide Derivatives Other (See Comments)    Dry mouth    Medications: I have reviewed the patient's current medications.  Results for orders placed or performed during the hospital encounter of 10/06/18 (from the past 48 hour(s))  SARS Coronavirus 2 (CEPHEID- Performed in [facility] hospital lab), Hosp Order     Status: None   Collection Time: 10/06/18 10:16 PM  Result Value Ref Range   SARS Coronavirus 2 NEGATIVE NEGATIVE    Comment: (NOTE) If result is NEGATIVE SARS-CoV-2 target nucleic acids are NOT DETECTED. The SARS-CoV-2 RNA is generally detectable in upper and lower  respiratory specimens during the acute phase of infection. The lowest  concentration of SARS-CoV-2 viral copies this assay can detect is 250  copies / mL. A negative result does not preclude SARS-CoV-2 infection  and should not be used as the sole basis for treatment or other  patient management decisions.  A negative result may occur with  improper specimen collection / handling, submission of specimen other  than nasopharyngeal swab, presence of viral mutation(s) within the  areas targeted by this assay, and inadequate number of viral copies  (<250 copies / mL). A negative result must be combined with clinical  observations, patient history, and epidemiological information. If result is POSITIVE SARS-CoV-2 target nucleic acids are DETECTED. The SARS-CoV-2 RNA is generally detectable in upper and lower  respiratory specimens dur ing the acute phase of infection.  Positive  results are indicative of  active infection with SARS-CoV-2.  Clinical  correlation with patient history and other diagnostic information is  necessary to determine patient infection status.  Positive results do  not rule out bacterial infection or co-infection with other viruses. If result is PRESUMPTIVE POSTIVE SARS-CoV-2 nucleic acids MAY BE PRESENT.   A presumptive positive result was obtained on the submitted specimen  and confirmed on repeat testing.  While 2019 novel coronavirus  (SARS-CoV-2) nucleic acids may be present in the submitted sample  additional confirmatory testing may be necessary for epidemiological  and / or clinical management purposes  to differentiate between  SARS-CoV-2 and other Sarbecovirus currently known to infect humans.  If clinically indicated additional testing with an alternate test  methodology [phone]) is advised. The SARS-CoV-2 RNA is generally  detectable in upper and lower respiratory sp ecimens during the acute  phase of infection. The expected result is Negative. Fact Sheet for Patients:  StrictlyIdeas.no Fact Sheet for Healthcare Providers: BankingDealers.co.za This test is not yet approved or cleared by the [place]  FDA and has been authorized for detection and/or diagnosis of SARS-CoV-2 by FDA under an Emergency Use Authorization (EUA).  This EUA will remain in effect (meaning this test can be used) for the duration of the COVID-19 declaration under Section 564(b)(1) of the Act, 21 U.S.C. section 360bbb-3(b)(1), unless the authorization is terminated or revoked sooner. Performed at [facility], [place] [address], [place], [place] [address]   Basic metabolic panel     Status: Abnormal   Collection Time: 10/06/18 10:20 PM  Result Value Ref Range   Sodium 137 135 - 145 mmol/L   Potassium 2.8 (L) 3.5 - 5.1 mmol/L   Chloride 96 (L) 98 - 111 mmol/L   CO2 31 22 - 32 mmol/L   Glucose, Bld 139 (H) 70 -  99 mg/dL   BUN 17 8 - 23 mg/dL   Creatinine, Ser 1.26 (H) 0.44 - 1.00 mg/dL   Calcium 8.7 (L) 8.9 - 10.3 mg/dL   GFR calc non Af Amer 40 (L) >60 mL/min   GFR calc Af Amer 46 (L) >60 mL/min   Anion gap 10 5 - 15    Comment: Performed at [facility], [place] [address] [place], [place] [address]  CBC WITH DIFFERENTIAL     Status: Abnormal   Collection Time: 10/06/18 10:20 PM  Result Value Ref Range   WBC 7.4 4.0 - 10.5 K/uL   RBC 4.49 3.87 - 5.11 MIL/uL   Hemoglobin 8.0 (L) 12.0 - 15.0 g/dL    Comment: Reticulocyte Hemoglobin testing may be clinically indicated, consider ordering this additional test [id number]    HCT 29.6 (L) 36.0 - 46.0 %   MCV 65.9 (L) 80.0 - 100.0 fL   MCH 17.8 (L) 26.0 - 34.0 pg   MCHC 27.0 (L) 30.0 - 36.0 g/dL   RDW 20.9 (H) 11.5 - 15.5 %   Platelets 299 150 - 400 K/uL    Comment: REPEATED TO VERIFY   nRBC 0.0 0.0 - 0.2 %   Neutrophils Relative % 89 %   Neutro Abs 6.6 1.7 - 7.7 K/uL   Lymphocytes Relative 5 %   Lymphs Abs 0.4 (L) 0.7 - 4.0 K/uL   Monocytes Relative 5 %   Monocytes Absolute 0.4 0.1 - 1.0 K/uL   Eosinophils Relative 0 %   Eosinophils Absolute 0.0 0.0 - 0.5 K/uL   Basophils Relative 0 %   Basophils Absolute 0.0 0.0 - 0.1 K/uL   Immature Granulocytes 1 %   Abs Immature Granulocytes 0.05 0.00 - 0.07 K/uL   Polychromasia PRESENT    Target Cells PRESENT     Comment: Performed at [facility], [place] [address], [place], [place] [address]  Protime-INR     Status: Abnormal   Collection Time: 10/06/18 10:20 PM  Result Value Ref Range   Prothrombin Time 20.4 (H) 11.4 - 15.2 seconds   INR 1.8 (H) 0.8 - 1.2    Comment: (NOTE) INR goal varies based on device and disease states. Performed at [facility], [place] [address], [place], [place] [address]   Type and screen [place]     Status: None   Collection Time: 10/06/18 10:20 PM  Result Value Ref Range   ABO/RH(D) A NEG     Antibody Screen NEG    Sample Expiration      10/09/2018,2359 Performed at [facility], [place] [address] [place], [place] [address]   Magnesium  Status: None   Collection Time: 10/06/18 10:20 PM  Result Value Ref Range   Magnesium 1.9 1.7 - 2.4 mg/dL    Comment: Performed at [facility], [place] [address] [place], [place] [address]  Brain natriuretic peptide     Status: Abnormal   Collection Time: 10/06/18 10:20 PM  Result Value Ref Range   B Natriuretic Peptide 235.7 (H) 0.0 - 100.0 pg/mL    Comment: Performed at [facility], [place] [address], [place], [place] [address]  D-dimer, quantitative (not at [facility])     Status: Abnormal   Collection Time: 10/07/18 12:38 AM  Result Value Ref Range   D-Dimer, Quant 16.06 (H) 0.00 - 0.50 ug/mL-FEU    Comment: (NOTE) At the manufacturer cut-off of 0.50 ug/mL FEU, this assay has been documented to exclude PE with a sensitivity and negative predictive value of 97 to 99%.  At this time, this assay has not been approved by the FDA to exclude DVT/VTE. Results should be correlated with clinical presentation. Performed at [facility], [place] [address], [place], [place] [address]   Urinalysis, Routine w reflex microscopic     Status: None   Collection Time: 10/07/18  1:20 AM  Result Value Ref Range   Color, Urine YELLOW YELLOW   APPearance CLEAR CLEAR   Specific Gravity, Urine 1.008 1.005 - 1.030   pH 6.0 5.0 - 8.0   Glucose, UA NEGATIVE NEGATIVE mg/dL   Hgb urine dipstick NEGATIVE NEGATIVE   Bilirubin Urine NEGATIVE NEGATIVE   Ketones, ur NEGATIVE NEGATIVE mg/dL   Protein, ur NEGATIVE NEGATIVE mg/dL   Nitrite NEGATIVE NEGATIVE   Leukocytes,Ua NEGATIVE NEGATIVE    Comment: Performed at [place] [address] [place], [place] [address]  Surgical PCR screen     Status: None   Collection Time: 10/07/18  3:11 AM  Result Value Ref Range    MRSA, PCR NEGATIVE NEGATIVE   Staphylococcus aureus NEGATIVE NEGATIVE    Comment: (NOTE) The Xpert SA Assay (FDA approved for NASAL specimens in patients 59 years of age and older), is one component of a comprehensive surveillance program. It is not intended to diagnose infection nor to guide or monitor treatment. Performed at [facility], [place] [address], [place], [place] [address]    *Note: Due to a large number of results and/or encounters for the requested time period, some results have not been displayed. A complete set of results can be found in Results Review.    Dg Chest 1 View  Result Date: 10/06/2018 CLINICAL DATA:  Hip fracture EXAM: CHEST  1 VIEW COMPARISON:  05/12/2018 FINDINGS: Unchanged position of left chest wall AICD leads. Mild cardiomegaly with hazy lower lobe predominant airspace opacities, possibly mild pulmonary edema. Limited assessment of left lung base due to positioning., but there is some degree of retrocardiac opacity. IMPRESSION: Mild cardiomegaly and mild pulmonary edema. Limited assessment of left lung base due to positioning, but some degree of retrocardiac opacity. Lateral view would be helpful for further evaluation if necessary. Electronically Signed   By: Ulyses Jarred M.D.   On: 10/06/2018 22:20   Ct Angio Chest Pe W Or Wo Contrast  Result Date: 10/07/2018 CLINICAL DATA:  Hypoxia and elevated D-dimer. EXAM: CT ANGIOGRAPHY CHEST WITH CONTRAST TECHNIQUE: Multidetector CT imaging of the chest was performed using the standard protocol during bolus administration of intravenous contrast. Multiplanar CT image reconstructions and MIPs were obtained to evaluate the vascular anatomy.  CONTRAST:  34mL OMNIPAQUE IOHEXOL 350 MG/ML SOLN COMPARISON:  Chest x-ray from yesterday.  Chest CT 06/07/2010 FINDINGS: Cardiovascular: Cardiomegaly without pericardial effusion. Biventricular pacer from the left with 2 right ventricular ICD leads. Negative for pulmonary  artery filling defect. Mediastinum/Nodes: Negative for mass or adenopathy Lungs/Pleura: Moderate layering pleural effusions on both sides. There is tracheobronchomalacia with generalized intermittently seen airway collapse. Dependent atelectasis. Ground-glass opacity asymmetric to the left lung with interlobular septal thickening. There are a few small subpleural nodules. Negative for lung mass. Upper Abdomen: Intrahepatic venous reflux. Musculoskeletal: No acute or aggressive finding Review of the MIP images confirms the above findings. IMPRESSION: 1. Cardiomegaly with moderate bilateral layering pleural effusion. 2. Ground-glass opacity in the left more than right lung, likely edema. 3. Negative for pulmonary embolism. Electronically Signed   By: Monte Fantasia M.D.   On: 10/07/2018 04:38   Dg Hip Unilat With Pelvis 2-3 Views Left  Result Date: 10/06/2018 CLINICAL DATA:  Fall EXAM: DG HIP (WITH OR WITHOUT PELVIS) 2-3V LEFT COMPARISON:  None. FINDINGS: Minimally displaced fracture of the left femoral neck with foreshortening. Femoral head remains situated in the acetabulum. Normal appearance of right total hip arthroplasty. No pelvic ring fracture. IMPRESSION: Minimally displaced left femoral neck fracture with foreshortening. Electronically Signed   By: Ulyses Jarred M.D.   On: 10/06/2018 22:18    ROS Blood pressure (!) 142/69, pulse 78, temperature 98.1 F (36.7 C), temperature source Oral, resp. rate 20, height 5\' 6"  (1.676 m), weight 76.9 kg, SpO2 97 %. Physical Exam AAO, ,moderate distress, neck non tender with pain free AROM, T and L spine non tender, bilateral UEs with pain free AROM, shoulders, elbows, and wrists, 5/5 motor, sensation intact Right LE with dependent edema, but relatively pain free AROM, neutral knee alignment intact sensation distally bilaterally Left LE with painful passive hip ROM, knee is non swollen and minimally tender  Assessment/Plan: Left displaced femoral neck  fracture. Patient has multiple medical problems and appears to be in heart failure with fluid on lungs, will need to be medically optimized before surgery. Will discuss with our hip specialists this AM for surgical plan and timing Patient on heparin (was on Eliquis for A Fib) Mechanical DVT prophylxis  Augustin Schooling 10/07/2018, 5:50 AM

## 2018-10-07 NOTE — Progress Notes (Signed)
Bilateral lower extremity venous duplex completed. Refer to "CV Proc" under chart review to view preliminary results.  10/07/2018 9:59 AM Maudry Mayhew, MHA, RVT, RDCS, RDMS

## 2018-10-07 NOTE — ED Notes (Signed)
ED TO INPATIENT HANDOFF REPORT  ED Nurse Name and Phone #: Judye Bos Name/Age/Gender [name] 85 y.o. female Room/Bed: WA20/WA20  Code Status   Code Status: Prior  Home/SNF/Other Home Patient oriented to: self, place, time and situation Is this baseline? Yes   Triage Complete: Triage complete  Chief Complaint Fall/ Left hip pain  Triage Note Pt transported from home via [facility]. EMS reports the following:  Witnessed fall, mechanical, fell walking up some steps, missed a step. No LOC, no neck or back pain, did not hit head. Some respiratory distress on scene. Hx of respiratory distress, afib. Anxiety on scene O2 in 80s.Initally, put on 6 L. Now on 3L and o2 on 94%. Clear lung sounds. Doesn't wear oxygen at home. On blood thinners for afib. Currently on ABX for UTI.   C/O left hip pain, shortening of left leg and outward rotation. No instability or crepitus in left hip. Good PMS in left extremity.      Allergies Allergies  Allergen Reactions  . Codeine Nausea And Vomiting  . Morphine And Related Nausea And Vomiting  . Sulfonamide Derivatives Other (See Comments)    Dry mouth    Level of Care/Admitting Diagnosis ED Disposition    ED Disposition Condition Comment   Admit  Hospital Area: [place] [[id number]]  Level of Care: Med-Surg [16]  Covid Evaluation: Screening Protocol (No Symptoms)  Diagnosis: Closed left hip fracture [name]) [[id number]]  Admitting Physician: Harvie Bridge [[id number]]  Attending Physician: Sherron Monday  Estimated length of stay: 3 - 4 days  Certification:: I certify this patient will need inpatient services for at least 2 midnights  PT Class (Do Not Modify): Inpatient [101]  PT Acc Code (Do Not Modify): Private [1]       B Medical/Surgery History Past Medical History:  Diagnosis Date  . Adjustment disorder with anxiety   . Anginal pain ([place])   . Arthritis    "some in my hands" (11/11/2012)  .  Atrial fibrillation ([place])   . Automatic implantable cardioverter-defibrillator in situ   . Blood in stool   . Carotid artery stenosis 09/2007   60-79% bilateral (stable)  . Carotid artery stenosis 10/2008   40-59% R 60-79%   . Chest pain, unspecified   . Chronic airway obstruction, not elsewhere classified   . Chronic bronchitis ([place])    "get it some; not q year" (11/11/2012)  . Chronic diastolic CHF (congestive heart failure) ([place]) 06/04/2013  . Chronic kidney disease, unspecified   . Coronary artery disease    non-obstructive by 2006 cath  . Dysuria   . Edema   . Encounter for therapeutic drug monitoring   . Exertional shortness of breath   . Frequent UTI    "get them a couple times/yr" (11/11/2012)  . Hemorrhage of rectum and anus   . High cholesterol   . History of blood transfusion 04/2011   "after hip OR" (11/11/2012)  . Hypertension 05/20/2011  . Hypertr obst cardiomyop   . Hypotension, unspecified    cardiac cath 2006..nonobstructive CAD 30-40s lesions.Marland KitchenETT 1/09 nondiagnostic due to poor HR response..Right Renal Cancer 2003  . Long term (current) use of anticoagulants   . Malignant neoplasm of kidney, except pelvis   . Osteoarthritis of right hip   . Other acute sinusitis   . Other and unspecified coagulation defects   . Other malaise and fatigue   . Other screening mammogram   . PONV (postoperative nausea and vomiting)   .  Pre-operative cardiovascular examination   . Renal cancer ([place]) 06/2001   Right  . Secondary cardiomyopathy, unspecified   . Sinus bradycardia   . Special screening for osteoporosis   . Urge incontinence    Past Surgical History:  Procedure Laterality Date  . ABDOMINAL HYSTERECTOMY  1975   for benign causes  . APPENDECTOMY    . BI-VENTRICULAR PACEMAKER UPGRADE  05/04/2010  . CARDIAC CATHETERIZATION  2006  . CARDIOVERSION N/A 02/20/2018   Procedure: CARDIOVERSION;  Surgeon: Minna Merritts, MD;  Location: [facility];  Service: Cardiovascular;   Laterality: N/A;  . CARDIOVERSION N/A 03/27/2018   Procedure: CARDIOVERSION;  Surgeon: Minna Merritts, MD;  Location: [facility];  Service: Cardiovascular;  Laterality: N/A;  . CATARACT EXTRACTION W/ INTRAOCULAR LENS  IMPLANT, BILATERAL  01/2006-02-2006  . CHOLECYSTECTOMY N/A 11/11/2012   Procedure: LAPAROSCOPIC CHOLECYSTECTOMY WITH INTRAOPERATIVE CHOLANGIOGRAM;  Surgeon: Imogene Burn. Georgette Dover, MD;  Location: [facility];  Service: General;  Laterality: N/A;  . EP IMPLANTABLE DEVICE N/A 02/21/2016   Procedure: ICD Generator Changeout;  Surgeon: Deboraha Sprang, MD;  Location: [facility];  Service: Cardiovascular;  Laterality: N/A;  . INSERT / REPLACE / REMOVE PACEMAKER  05-01-11   02-28-05-/05-04-10-ICD-MEDTRONIC MAXIMAL DR  . JOINT REPLACEMENT    . LAPAROSCOPIC CHOLECYSTECTOMY  11/11/2012  . LAPAROSCOPIC LYSIS OF ADHESIONS N/A 11/11/2012   Procedure: LAPAROSCOPIC LYSIS OF ADHESIONS;  Surgeon: Imogene Burn. Georgette Dover, MD;  Location: [facility];  Service: General;  Laterality: N/A;  . NEPHRECTOMY Right 06/2001    S/P RENAL CELL CANCER  . TOTAL HIP ARTHROPLASTY Right 05/03/2011   Procedure: TOTAL HIP ARTHROPLASTY ANTERIOR APPROACH;  Surgeon: Mcarthur Rossetti;  Location: [facility];  Service: Orthopedics;  Laterality: Right;  Removal of Cannulated Screws Right Hip, Right Direct Anterior Hip Replacement     A IV Location/Drains/Wounds Patient Lines/Drains/Airways Status   Active Line/Drains/Airways    Name:   Placement date:   Placement time:   Site:   Days:   Peripheral IV 10/06/18 Right Antecubital   10/06/18    2137    Antecubital   1   Closed System Drain 1 Right;Lateral Abdomen Bulb (JP) 19 Fr.   11/11/12    1317    Abdomen   2156   Urethral Catheter [place] Straight-tip 14 Fr.   10/07/18    0114    Straight-tip   less than 1   Incision 11/11/12 Abdomen Other (Comment)   11/11/12    1330     2156   Incision - 4 Ports Abdomen 1: Umbilicus 2: Mid;Upper 3: Right;Medial 4: Left;Mid   11/11/12    -     2156           Intake/Output Last 24 hours  Intake/Output Summary (Last 24 hours) at 10/07/2018 0221 Last data filed at 10/07/2018 0215 Gross per 24 hour  Intake 50 ml  Output -  Net 50 ml    Labs/Imaging Results for orders placed or performed during the hospital encounter of 10/06/18 (from the past 48 hour(s))  SARS Coronavirus 2 (CEPHEID- Performed in [facility] hospital lab), Hosp Order     Status: None   Collection Time: 10/06/18 10:16 PM  Result Value Ref Range   SARS Coronavirus 2 NEGATIVE NEGATIVE    Comment: (NOTE) If result is NEGATIVE SARS-CoV-2 target nucleic acids are NOT DETECTED. The SARS-CoV-2 RNA is generally detectable in upper and lower  respiratory specimens during the acute phase of infection. The lowest  concentration  of SARS-CoV-2 viral copies this assay can detect is 250  copies / mL. A negative result does not preclude SARS-CoV-2 infection  and should not be used as the sole basis for treatment or other  patient management decisions.  A negative result may occur with  improper specimen collection / handling, submission of specimen other  than nasopharyngeal swab, presence of viral mutation(s) within the  areas targeted by this assay, and inadequate number of viral copies  (<250 copies / mL). A negative result must be combined with clinical  observations, patient history, and epidemiological information. If result is POSITIVE SARS-CoV-2 target nucleic acids are DETECTED. The SARS-CoV-2 RNA is generally detectable in upper and lower  respiratory specimens dur ing the acute phase of infection.  Positive  results are indicative of active infection with SARS-CoV-2.  Clinical  correlation with patient history and other diagnostic information is  necessary to determine patient infection status.  Positive results do  not rule out bacterial infection or co-infection with other viruses. If result is PRESUMPTIVE POSTIVE SARS-CoV-2 nucleic acids MAY BE PRESENT.   A  presumptive positive result was obtained on the submitted specimen  and confirmed on repeat testing.  While 2019 novel coronavirus  (SARS-CoV-2) nucleic acids may be present in the submitted sample  additional confirmatory testing may be necessary for epidemiological  and / or clinical management purposes  to differentiate between  SARS-CoV-2 and other Sarbecovirus currently known to infect humans.  If clinically indicated additional testing with an alternate test  methodology [phone]) is advised. The SARS-CoV-2 RNA is generally  detectable in upper and lower respiratory sp ecimens during the acute  phase of infection. The expected result is Negative. Fact Sheet for Patients:  StrictlyIdeas.no Fact Sheet for Healthcare Providers: BankingDealers.co.za This test is not yet approved or cleared by the [place] FDA and has been authorized for detection and/or diagnosis of SARS-CoV-2 by FDA under an Emergency Use Authorization (EUA).  This EUA will remain in effect (meaning this test can be used) for the duration of the COVID-19 declaration under Section 564(b)(1) of the Act, 21 U.S.C. section 360bbb-3(b)(1), unless the authorization is terminated or revoked sooner. Performed at [facility], [place] [address], [place], [place] [address]   Basic metabolic panel     Status: Abnormal   Collection Time: 10/06/18 10:20 PM  Result Value Ref Range   Sodium 137 135 - 145 mmol/L   Potassium 2.8 (L) 3.5 - 5.1 mmol/L   Chloride 96 (L) 98 - 111 mmol/L   CO2 31 22 - 32 mmol/L   Glucose, Bld 139 (H) 70 - 99 mg/dL   BUN 17 8 - 23 mg/dL   Creatinine, Ser 1.26 (H) 0.44 - 1.00 mg/dL   Calcium 8.7 (L) 8.9 - 10.3 mg/dL   GFR calc non Af Amer 40 (L) >60 mL/min   GFR calc Af Amer 46 (L) >60 mL/min   Anion gap 10 5 - 15    Comment: Performed at [facility], [place] [address] [place], [place] [address]  CBC WITH  DIFFERENTIAL     Status: Abnormal   Collection Time: 10/06/18 10:20 PM  Result Value Ref Range   WBC 7.4 4.0 - 10.5 K/uL   RBC 4.49 3.87 - 5.11 MIL/uL   Hemoglobin 8.0 (L) 12.0 - 15.0 g/dL    Comment: Reticulocyte Hemoglobin testing may be clinically indicated, consider ordering this additional test [id number]    HCT 29.6 (L) 36.0 - 46.0 %  MCV 65.9 (L) 80.0 - 100.0 fL   MCH 17.8 (L) 26.0 - 34.0 pg   MCHC 27.0 (L) 30.0 - 36.0 g/dL   RDW 20.9 (H) 11.5 - 15.5 %   Platelets 299 150 - 400 K/uL    Comment: REPEATED TO VERIFY   nRBC 0.0 0.0 - 0.2 %   Neutrophils Relative % 89 %   Neutro Abs 6.6 1.7 - 7.7 K/uL   Lymphocytes Relative 5 %   Lymphs Abs 0.4 (L) 0.7 - 4.0 K/uL   Monocytes Relative 5 %   Monocytes Absolute 0.4 0.1 - 1.0 K/uL   Eosinophils Relative 0 %   Eosinophils Absolute 0.0 0.0 - 0.5 K/uL   Basophils Relative 0 %   Basophils Absolute 0.0 0.0 - 0.1 K/uL   Immature Granulocytes 1 %   Abs Immature Granulocytes 0.05 0.00 - 0.07 K/uL   Polychromasia PRESENT    Target Cells PRESENT     Comment: Performed at [facility], [place] [address] [place], [place] [address]  Protime-INR     Status: Abnormal   Collection Time: 10/06/18 10:20 PM  Result Value Ref Range   Prothrombin Time 20.4 (H) 11.4 - 15.2 seconds   INR 1.8 (H) 0.8 - 1.2    Comment: (NOTE) INR goal varies based on device and disease states. Performed at [facility], [place] [address], [place], [place] [address]   Type and screen [place]     Status: None   Collection Time: 10/06/18 10:20 PM  Result Value Ref Range   ABO/RH(D) A NEG    Antibody Screen NEG    Sample Expiration      10/09/2018,2359 Performed at [facility], [place] [address], [place], [place] [address]   Magnesium     Status: None   Collection Time: 10/06/18 10:20 PM  Result Value Ref Range   Magnesium 1.9 1.7 - 2.4 mg/dL    Comment: Performed at [facility], [place] [address] [place], [place] [address]  Brain natriuretic peptide     Status: Abnormal   Collection Time: 10/06/18 10:20 PM  Result Value Ref Range   B Natriuretic Peptide 235.7 (H) 0.0 - 100.0 pg/mL    Comment: Performed at [facility], [place] [address], [place], [place] [address]  D-dimer, quantitative (not at [facility])     Status: Abnormal   Collection Time: 10/07/18 12:38 AM  Result Value Ref Range   D-Dimer, Quant 16.06 (H) 0.00 - 0.50 ug/mL-FEU    Comment: (NOTE) At the manufacturer cut-off of 0.50 ug/mL FEU, this assay has been documented to exclude PE with a sensitivity and negative predictive value of 97 to 99%.  At this time, this assay has not been approved by the FDA to exclude DVT/VTE. Results should be correlated with clinical presentation. Performed at [facility], [place] [address], [place], [place] [address]   Urinalysis, Routine w reflex microscopic     Status: None   Collection Time: 10/07/18  1:20 AM  Result Value Ref Range   Color, Urine YELLOW YELLOW   APPearance CLEAR CLEAR   Specific Gravity, Urine 1.008 1.005 - 1.030   pH 6.0 5.0 - 8.0   Glucose, UA NEGATIVE NEGATIVE mg/dL   Hgb urine dipstick NEGATIVE NEGATIVE   Bilirubin Urine NEGATIVE NEGATIVE   Ketones, ur NEGATIVE NEGATIVE mg/dL   Protein, ur NEGATIVE NEGATIVE mg/dL   Nitrite NEGATIVE NEGATIVE   Leukocytes,Ua NEGATIVE  NEGATIVE    Comment: Performed at [facility], [place] [address] [place], [place] [address]   *Note: Due to a large number of results and/or encounters for the requested time period, some results have not been displayed. A complete set of results can be found in Results Review.   Dg Chest 1 View  Result Date: 10/06/2018 CLINICAL DATA:  Hip fracture EXAM: CHEST  1 VIEW COMPARISON:  05/12/2018 FINDINGS: Unchanged position of left chest wall AICD leads. Mild cardiomegaly with hazy lower lobe predominant airspace  opacities, possibly mild pulmonary edema. Limited assessment of left lung base due to positioning., but there is some degree of retrocardiac opacity. IMPRESSION: Mild cardiomegaly and mild pulmonary edema. Limited assessment of left lung base due to positioning, but some degree of retrocardiac opacity. Lateral view would be helpful for further evaluation if necessary. Electronically Signed   By: Ulyses Jarred M.D.   On: 10/06/2018 22:20   Dg Hip Unilat With Pelvis 2-3 Views Left  Result Date: 10/06/2018 CLINICAL DATA:  Fall EXAM: DG HIP (WITH OR WITHOUT PELVIS) 2-3V LEFT COMPARISON:  None. FINDINGS: Minimally displaced fracture of the left femoral neck with foreshortening. Femoral head remains situated in the acetabulum. Normal appearance of right total hip arthroplasty. No pelvic ring fracture. IMPRESSION: Minimally displaced left femoral neck fracture with foreshortening. Electronically Signed   By: Ulyses Jarred M.D.   On: 10/06/2018 22:18    Pending Labs Unresulted Labs (From admission, onward)    Start     Ordered   10/07/18 1100  Heparin level (unfractionated)  Once-Timed,   STAT     10/07/18 0130   10/07/18 0500  CBC  Daily,   R     10/07/18 0130   10/06/18 2301  Urine culture  ONCE - STAT,   STAT     10/06/18 2300   Signed and Held  Basic metabolic panel  Tomorrow morning,   R     Signed and Held   Signed and Held  CBC  Tomorrow morning,   R     Signed and Held          Vitals/Pain Today's Vitals   10/07/18 0115 10/07/18 0130 10/07/18 0145 10/07/18 0200  BP:  (!) 147/63  139/67  Pulse: 86 80 80 74  Resp: 20 16 19 15   Temp:      TempSrc:      SpO2: 100% 99% 99% 99%  Weight:      Height:      PainSc:        Isolation Precautions Droplet and Contact precautions  Medications Medications  ondansetron (ZOFRAN) injection 4 mg (4 mg Intravenous Given 10/06/18 2250)  potassium chloride 10 mEq in 100 mL IVPB (10 mEq Intravenous New Bag/Given 10/07/18 0216)  heparin ADULT  infusion 100 units/mL (25000 units/225mL sodium chloride 0.45%) (has no administration in time range)  fentaNYL (SUBLIMAZE) injection 25 mcg (has no administration in time range)  fentaNYL (SUBLIMAZE) injection 12.5 mcg (12.5 mcg Intravenous Given 10/07/18 0005)  magnesium sulfate IVPB 2 g 50 mL (0 g Intravenous Stopped 10/07/18 0215)  furosemide (LASIX) injection 40 mg (40 mg Intravenous Given 10/07/18 0048)    Mobility walks Moderate fall risk   Focused Assessments NA   R Recommendations: See Admitting Provider Note  Report given to:   Additional Notes: NA

## 2018-10-07 NOTE — Progress Notes (Signed)
Patients granddaughter, [name], requested to facetime with patient via ipad. Patient is asleep at this time so this RN contacted [place] via telephone. Pts granddaughter stated to "let her sleep, and we will try later."

## 2018-10-07 NOTE — Progress Notes (Signed)
When rounding on patient prior to shift change, pt was found to be lethargic with slow respiratory rate between 6-8 breaths per minute, vitals stable otherwise. Dr. Tawanna Solo MD paged and narcan given, see MAR. Verbal order also received for STAT ABG. Tabitha with respiratory notified to draw ABG. K Schorr NP also made aware of situation and a second dose of narcan was ordered and given. Pt responded quickly to narcan and Lamar Blinks NP came to bedside to assess patient. Pt vital signs stable. ABG resulted and reported to Lamar Blinks NP. Pt is alert and oriented and voiced that she "didn't feel good." Patient continued to ask where her family was. Granddaughter, [name], was called via iPad so that patient could facetime with her and pts daughter, [name]. Family was updated by this RN and Lamar Blinks, NP. All questions answered and patient is in no distress at this time. Pt is currently on the phone with her granddaughter. Report given to oncoming RNs, Alver Fisher and Jess.

## 2018-10-07 NOTE — H&P (View-Only) (Signed)
Reason for Consult:Left hip fracture Referring Physician: EDP  [name] is an 85 y.o. female.  HPI: 85 yo female with a history of frequent falls who presents with a ground level fall yesterday. She complains of severe left hip pain and was unable to stand after the fall.  She denies LOC or other complaints.  Past Medical History:  Diagnosis Date  . Adjustment disorder with anxiety   . Anginal pain ([place])   . Arthritis    "some in my hands" (11/11/2012)  . Atrial fibrillation ([place])   . Automatic implantable cardioverter-defibrillator in situ   . Blood in stool   . Carotid artery stenosis 09/2007   60-79% bilateral (stable)  . Carotid artery stenosis 10/2008   40-59% R 60-79%   . Chest pain, unspecified   . Chronic airway obstruction, not elsewhere classified   . Chronic bronchitis ([place])    "get it some; not q year" (11/11/2012)  . Chronic diastolic CHF (congestive heart failure) ([place]) 06/04/2013  . Chronic kidney disease, unspecified   . Coronary artery disease    non-obstructive by 2006 cath  . Dysuria   . Edema   . Encounter for therapeutic drug monitoring   . Exertional shortness of breath   . Frequent UTI    "get them a couple times/yr" (11/11/2012)  . Hemorrhage of rectum and anus   . High cholesterol   . History of blood transfusion 04/2011   "after hip OR" (11/11/2012)  . Hypertension 05/20/2011  . Hypertr obst cardiomyop   . Hypotension, unspecified    cardiac cath 2006..nonobstructive CAD 30-40s lesions.Marland KitchenETT 1/09 nondiagnostic due to poor HR response..Right Renal Cancer 2003  . Long term (current) use of anticoagulants   . Malignant neoplasm of kidney, except pelvis   . Osteoarthritis of right hip   . Other acute sinusitis   . Other and unspecified coagulation defects   . Other malaise and fatigue   . Other screening mammogram   . PONV (postoperative nausea and vomiting)   . Pre-operative cardiovascular examination   . Renal cancer ([place]) 06/2001   Right  .  Secondary cardiomyopathy, unspecified   . Sinus bradycardia   . Special screening for osteoporosis   . Urge incontinence     Past Surgical History:  Procedure Laterality Date  . ABDOMINAL HYSTERECTOMY  1975   for benign causes  . APPENDECTOMY    . BI-VENTRICULAR PACEMAKER UPGRADE  05/04/2010  . CARDIAC CATHETERIZATION  2006  . CARDIOVERSION N/A 02/20/2018   Procedure: CARDIOVERSION;  Surgeon: Minna Merritts, MD;  Location: [facility];  Service: Cardiovascular;  Laterality: N/A;  . CARDIOVERSION N/A 03/27/2018   Procedure: CARDIOVERSION;  Surgeon: Minna Merritts, MD;  Location: [facility];  Service: Cardiovascular;  Laterality: N/A;  . CATARACT EXTRACTION W/ INTRAOCULAR LENS  IMPLANT, BILATERAL  01/2006-02-2006  . CHOLECYSTECTOMY N/A 11/11/2012   Procedure: LAPAROSCOPIC CHOLECYSTECTOMY WITH INTRAOPERATIVE CHOLANGIOGRAM;  Surgeon: Imogene Burn. Georgette Dover, MD;  Location: [facility];  Service: General;  Laterality: N/A;  . EP IMPLANTABLE DEVICE N/A 02/21/2016   Procedure: ICD Generator Changeout;  Surgeon: Deboraha Sprang, MD;  Location: [facility];  Service: Cardiovascular;  Laterality: N/A;  . INSERT / REPLACE / REMOVE PACEMAKER  05-01-11   02-28-05-/05-04-10-ICD-MEDTRONIC MAXIMAL DR  . JOINT REPLACEMENT    . LAPAROSCOPIC CHOLECYSTECTOMY  11/11/2012  . LAPAROSCOPIC LYSIS OF ADHESIONS N/A 11/11/2012   Procedure: LAPAROSCOPIC LYSIS OF ADHESIONS;  Surgeon: Imogene Burn. Georgette Dover, MD;  Location: [facility];  Service:  General;  Laterality: N/A;  . NEPHRECTOMY Right 06/2001    S/P RENAL CELL CANCER  . TOTAL HIP ARTHROPLASTY Right 05/03/2011   Procedure: TOTAL HIP ARTHROPLASTY ANTERIOR APPROACH;  Surgeon: Mcarthur Rossetti;  Location: [facility];  Service: Orthopedics;  Laterality: Right;  Removal of Cannulated Screws Right Hip, Right Direct Anterior Hip Replacement    Family History  Problem Relation Age of Onset  . Heart failure Mother   . Breast cancer Maternal Aunt 82  . Breast cancer Cousin   . Breast  cancer Other   . Colon cancer Neg Hx     Social History:  reports that she quit smoking about 17 years ago. Her smoking use included cigarettes. She has a 20.00 pack-year smoking history. She has never used smokeless tobacco. She reports that she does not drink alcohol or use drugs.  Allergies:  Allergies  Allergen Reactions  . Codeine Nausea And Vomiting  . Morphine And Related Nausea And Vomiting  . Sulfonamide Derivatives Other (See Comments)    Dry mouth    Medications: I have reviewed the patient's current medications.  Results for orders placed or performed during the hospital encounter of 10/06/18 (from the past 48 hour(s))  SARS Coronavirus 2 (CEPHEID- Performed in [facility] hospital lab), Hosp Order     Status: None   Collection Time: 10/06/18 10:16 PM  Result Value Ref Range   SARS Coronavirus 2 NEGATIVE NEGATIVE    Comment: (NOTE) If result is NEGATIVE SARS-CoV-2 target nucleic acids are NOT DETECTED. The SARS-CoV-2 RNA is generally detectable in upper and lower  respiratory specimens during the acute phase of infection. The lowest  concentration of SARS-CoV-2 viral copies this assay can detect is 250  copies / mL. A negative result does not preclude SARS-CoV-2 infection  and should not be used as the sole basis for treatment or other  patient management decisions.  A negative result may occur with  improper specimen collection / handling, submission of specimen other  than nasopharyngeal swab, presence of viral mutation(s) within the  areas targeted by this assay, and inadequate number of viral copies  (<250 copies / mL). A negative result must be combined with clinical  observations, patient history, and epidemiological information. If result is POSITIVE SARS-CoV-2 target nucleic acids are DETECTED. The SARS-CoV-2 RNA is generally detectable in upper and lower  respiratory specimens dur ing the acute phase of infection.  Positive  results are indicative of  active infection with SARS-CoV-2.  Clinical  correlation with patient history and other diagnostic information is  necessary to determine patient infection status.  Positive results do  not rule out bacterial infection or co-infection with other viruses. If result is PRESUMPTIVE POSTIVE SARS-CoV-2 nucleic acids MAY BE PRESENT.   A presumptive positive result was obtained on the submitted specimen  and confirmed on repeat testing.  While 2019 novel coronavirus  (SARS-CoV-2) nucleic acids may be present in the submitted sample  additional confirmatory testing may be necessary for epidemiological  and / or clinical management purposes  to differentiate between  SARS-CoV-2 and other Sarbecovirus currently known to infect humans.  If clinically indicated additional testing with an alternate test  methodology [phone]) is advised. The SARS-CoV-2 RNA is generally  detectable in upper and lower respiratory sp ecimens during the acute  phase of infection. The expected result is Negative. Fact Sheet for Patients:  StrictlyIdeas.no Fact Sheet for Healthcare Providers: BankingDealers.co.za This test is not yet approved or cleared by the [place]  FDA and has been authorized for detection and/or diagnosis of SARS-CoV-2 by FDA under an Emergency Use Authorization (EUA).  This EUA will remain in effect (meaning this test can be used) for the duration of the COVID-19 declaration under Section 564(b)(1) of the Act, 21 U.S.C. section 360bbb-3(b)(1), unless the authorization is terminated or revoked sooner. Performed at [facility], [place] [address] [place], [place] [address]   Basic metabolic panel     Status: Abnormal   Collection Time: 10/06/18 10:20 PM  Result Value Ref Range   Sodium 137 135 - 145 mmol/L   Potassium 2.8 (L) 3.5 - 5.1 mmol/L   Chloride 96 (L) 98 - 111 mmol/L   CO2 31 22 - 32 mmol/L   Glucose, Bld 139 (H) 70 -  99 mg/dL   BUN 17 8 - 23 mg/dL   Creatinine, Ser 1.26 (H) 0.44 - 1.00 mg/dL   Calcium 8.7 (L) 8.9 - 10.3 mg/dL   GFR calc non Af Amer 40 (L) >60 mL/min   GFR calc Af Amer 46 (L) >60 mL/min   Anion gap 10 5 - 15    Comment: Performed at [facility], [place] [address], [place], [place] [address]  CBC WITH DIFFERENTIAL     Status: Abnormal   Collection Time: 10/06/18 10:20 PM  Result Value Ref Range   WBC 7.4 4.0 - 10.5 K/uL   RBC 4.49 3.87 - 5.11 MIL/uL   Hemoglobin 8.0 (L) 12.0 - 15.0 g/dL    Comment: Reticulocyte Hemoglobin testing may be clinically indicated, consider ordering this additional test [id number]    HCT 29.6 (L) 36.0 - 46.0 %   MCV 65.9 (L) 80.0 - 100.0 fL   MCH 17.8 (L) 26.0 - 34.0 pg   MCHC 27.0 (L) 30.0 - 36.0 g/dL   RDW 20.9 (H) 11.5 - 15.5 %   Platelets 299 150 - 400 K/uL    Comment: REPEATED TO VERIFY   nRBC 0.0 0.0 - 0.2 %   Neutrophils Relative % 89 %   Neutro Abs 6.6 1.7 - 7.7 K/uL   Lymphocytes Relative 5 %   Lymphs Abs 0.4 (L) 0.7 - 4.0 K/uL   Monocytes Relative 5 %   Monocytes Absolute 0.4 0.1 - 1.0 K/uL   Eosinophils Relative 0 %   Eosinophils Absolute 0.0 0.0 - 0.5 K/uL   Basophils Relative 0 %   Basophils Absolute 0.0 0.0 - 0.1 K/uL   Immature Granulocytes 1 %   Abs Immature Granulocytes 0.05 0.00 - 0.07 K/uL   Polychromasia PRESENT    Target Cells PRESENT     Comment: Performed at [facility], [place] [address] [place], [place] [address]  Protime-INR     Status: Abnormal   Collection Time: 10/06/18 10:20 PM  Result Value Ref Range   Prothrombin Time 20.4 (H) 11.4 - 15.2 seconds   INR 1.8 (H) 0.8 - 1.2    Comment: (NOTE) INR goal varies based on device and disease states. Performed at [facility], [place] [address] [place], [place] [address]   Type and screen [place]     Status: None   Collection Time: 10/06/18 10:20 PM  Result Value Ref Range   ABO/RH(D) A NEG     Antibody Screen NEG    Sample Expiration      10/09/2018,2359 Performed at [facility], [place] [address], [place], [place] [address]   Magnesium  Status: None   Collection Time: 10/06/18 10:20 PM  Result Value Ref Range   Magnesium 1.9 1.7 - 2.4 mg/dL    Comment: Performed at [facility], [place] [address] [place], [place] [address]  Brain natriuretic peptide     Status: Abnormal   Collection Time: 10/06/18 10:20 PM  Result Value Ref Range   B Natriuretic Peptide 235.7 (H) 0.0 - 100.0 pg/mL    Comment: Performed at [facility], [place] [address] [place], [place] [address]  D-dimer, quantitative (not at [facility])     Status: Abnormal   Collection Time: 10/07/18 12:38 AM  Result Value Ref Range   D-Dimer, Quant 16.06 (H) 0.00 - 0.50 ug/mL-FEU    Comment: (NOTE) At the manufacturer cut-off of 0.50 ug/mL FEU, this assay has been documented to exclude PE with a sensitivity and negative predictive value of 97 to 99%.  At this time, this assay has not been approved by the FDA to exclude DVT/VTE. Results should be correlated with clinical presentation. Performed at [facility], [place] [address] [place], [place] [address]   Urinalysis, Routine w reflex microscopic     Status: None   Collection Time: 10/07/18  1:20 AM  Result Value Ref Range   Color, Urine YELLOW YELLOW   APPearance CLEAR CLEAR   Specific Gravity, Urine 1.008 1.005 - 1.030   pH 6.0 5.0 - 8.0   Glucose, UA NEGATIVE NEGATIVE mg/dL   Hgb urine dipstick NEGATIVE NEGATIVE   Bilirubin Urine NEGATIVE NEGATIVE   Ketones, ur NEGATIVE NEGATIVE mg/dL   Protein, ur NEGATIVE NEGATIVE mg/dL   Nitrite NEGATIVE NEGATIVE   Leukocytes,Ua NEGATIVE NEGATIVE    Comment: Performed at [place] [address] [place], [place] [address]  Surgical PCR screen     Status: None   Collection Time: 10/07/18  3:11 AM  Result Value Ref Range    MRSA, PCR NEGATIVE NEGATIVE   Staphylococcus aureus NEGATIVE NEGATIVE    Comment: (NOTE) The Xpert SA Assay (FDA approved for NASAL specimens in patients 13 years of age and older), is one component of a comprehensive surveillance program. It is not intended to diagnose infection nor to guide or monitor treatment. Performed at [facility], [place] [address], [place], [place] [address]    *Note: Due to a large number of results and/or encounters for the requested time period, some results have not been displayed. A complete set of results can be found in Results Review.    Dg Chest 1 View  Result Date: 10/06/2018 CLINICAL DATA:  Hip fracture EXAM: CHEST  1 VIEW COMPARISON:  05/12/2018 FINDINGS: Unchanged position of left chest wall AICD leads. Mild cardiomegaly with hazy lower lobe predominant airspace opacities, possibly mild pulmonary edema. Limited assessment of left lung base due to positioning., but there is some degree of retrocardiac opacity. IMPRESSION: Mild cardiomegaly and mild pulmonary edema. Limited assessment of left lung base due to positioning, but some degree of retrocardiac opacity. Lateral view would be helpful for further evaluation if necessary. Electronically Signed   By: Ulyses Jarred M.D.   On: 10/06/2018 22:20   Ct Angio Chest Pe W Or Wo Contrast  Result Date: 10/07/2018 CLINICAL DATA:  Hypoxia and elevated D-dimer. EXAM: CT ANGIOGRAPHY CHEST WITH CONTRAST TECHNIQUE: Multidetector CT imaging of the chest was performed using the standard protocol during bolus administration of intravenous contrast. Multiplanar CT image reconstructions and MIPs were obtained to evaluate the vascular anatomy.  CONTRAST:  72mL OMNIPAQUE IOHEXOL 350 MG/ML SOLN COMPARISON:  Chest x-ray from yesterday.  Chest CT 06/07/2010 FINDINGS: Cardiovascular: Cardiomegaly without pericardial effusion. Biventricular pacer from the left with 2 right ventricular ICD leads. Negative for pulmonary  artery filling defect. Mediastinum/Nodes: Negative for mass or adenopathy Lungs/Pleura: Moderate layering pleural effusions on both sides. There is tracheobronchomalacia with generalized intermittently seen airway collapse. Dependent atelectasis. Ground-glass opacity asymmetric to the left lung with interlobular septal thickening. There are a few small subpleural nodules. Negative for lung mass. Upper Abdomen: Intrahepatic venous reflux. Musculoskeletal: No acute or aggressive finding Review of the MIP images confirms the above findings. IMPRESSION: 1. Cardiomegaly with moderate bilateral layering pleural effusion. 2. Ground-glass opacity in the left more than right lung, likely edema. 3. Negative for pulmonary embolism. Electronically Signed   By: Monte Fantasia M.D.   On: 10/07/2018 04:38   Dg Hip Unilat With Pelvis 2-3 Views Left  Result Date: 10/06/2018 CLINICAL DATA:  Fall EXAM: DG HIP (WITH OR WITHOUT PELVIS) 2-3V LEFT COMPARISON:  None. FINDINGS: Minimally displaced fracture of the left femoral neck with foreshortening. Femoral head remains situated in the acetabulum. Normal appearance of right total hip arthroplasty. No pelvic ring fracture. IMPRESSION: Minimally displaced left femoral neck fracture with foreshortening. Electronically Signed   By: Ulyses Jarred M.D.   On: 10/06/2018 22:18    ROS Blood pressure (!) 142/69, pulse 78, temperature 98.1 F (36.7 C), temperature source Oral, resp. rate 20, height 5\' 6"  (1.676 m), weight 76.9 kg, SpO2 97 %. Physical Exam AAO, ,moderate distress, neck non tender with pain free AROM, T and L spine non tender, bilateral UEs with pain free AROM, shoulders, elbows, and wrists, 5/5 motor, sensation intact Right LE with dependent edema, but relatively pain free AROM, neutral knee alignment intact sensation distally bilaterally Left LE with painful passive hip ROM, knee is non swollen and minimally tender  Assessment/Plan: Left displaced femoral neck  fracture. Patient has multiple medical problems and appears to be in heart failure with fluid on lungs, will need to be medically optimized before surgery. Will discuss with our hip specialists this AM for surgical plan and timing Patient on heparin (was on Eliquis for A Fib) Mechanical DVT prophylxis  Augustin Schooling 10/07/2018, 5:50 AM

## 2018-10-07 NOTE — H&P (Signed)
History and Physical   [facility] [place] [facility] [place] Admission History and Physical [employer], D.O.    Patient Name: [name] MR#: [id number] Date of Birth: 07/16/1933 Date of Admission: 10/06/2018  Referring MD/NP/PA: [place] Primary Care Physician: Elby Beck, FNP  Chief Complaint:  Chief Complaint  Patient presents with  . Fall  . Hip Pain    HPI: Lalanya Rufener is a 85 y.o. female with a known history of chronic diastolic heart failure with pacemaker, atrial fibrillation on Coumadin, CKD, CAD, TIA, chronica anemia, recurrent urinary tract infection presents to the emergency department for evaluation of left hip pain status post mechanical fall.  Patient was in a usual state of health until patient states that she has had significant swelling in her lower extremities that caused her to have difficulty with her ambulation today.  She states that she lost her balance and fell onto her left hip, denies head trauma, denies loss of consciousness.  Other than hip pain she reports shortness of breath that began following a fall.  EMS found the patient to have O2 sats in the 80s and was placed on nasal cannula.  She reports that her physician has recently been titrating her doses of metolazone and torsemide to address her chronic diastolic heart failure and peripheral edema..  Of note patient has recently been diagnosed with another urinary tract infection and is currently on Keflex.  Patient denies fevers/chills, weakness, dizziness, chest pain, N/V/C/D, abdominal pain, dysuria/frequency, changes in mental status.    Otherwise there has been no change in status. Patient has been taking medication as prescribed and there has been no recent change in medication or diet.  No recent antibiotics.  There has been no recent illness, hospitalizations, travel or sick contacts.     Review of Systems:  CONSTITUTIONAL: No fever/chills, fatigue, weakness, weight  gain/loss, headache. EYES: No blurry or double vision. ENT: No tinnitus, postnasal drip, redness or soreness of the oropharynx. RESPIRATORY: Positive shortness of breath.  No cough, wheeze.  No hemoptysis.  CARDIOVASCULAR: No chest pain, palpitations, syncope, orthopnea. No lower extremity edema.  GASTROINTESTINAL: No nausea, vomiting, abdominal pain, diarrhea, constipation.  No hematemesis, melena or hematochezia. GENITOURINARY: No dysuria, frequency, hematuria.  Positive UTI ENDOCRINE: No polyuria or nocturia. No heat or cold intolerance. HEMATOLOGY: No anemia, bruising, bleeding. INTEGUMENTARY: No rashes, ulcers, lesions. MUSCULOSKELETAL: Positive left hip pain no arthritis, gout. NEUROLOGIC: No numbness, tingling, ataxia, seizure-type activity, weakness. PSYCHIATRIC: No anxiety, depression, insomnia.   Past Medical History:  Diagnosis Date  . Adjustment disorder with anxiety   . Anginal pain ([name])   . Arthritis    "some in my hands" (11/11/2012)  . Atrial fibrillation ([place])   . Automatic implantable cardioverter-defibrillator in situ   . Blood in stool   . Carotid artery stenosis 09/2007   60-79% bilateral (stable)  . Carotid artery stenosis 10/2008   40-59% R 60-79%   . Chest pain, unspecified   . Chronic airway obstruction, not elsewhere classified   . Chronic bronchitis ([place])    "get it some; not q year" (11/11/2012)  . Chronic diastolic CHF (congestive heart failure) ([place]) 06/04/2013  . Chronic kidney disease, unspecified   . Coronary artery disease    non-obstructive by 2006 cath  . Dysuria   . Edema   . Encounter for therapeutic drug monitoring   . Exertional shortness of breath   . Frequent UTI    "get them a couple times/yr" (11/11/2012)  .  Hemorrhage of rectum and anus   . High cholesterol   . History of blood transfusion 04/2011   "after hip OR" (11/11/2012)  . Hypertension 05/20/2011  . Hypertr obst cardiomyop   . Hypotension, unspecified    cardiac cath  2006..nonobstructive CAD 30-40s lesions.Marland KitchenETT 1/09 nondiagnostic due to poor HR response..Right Renal Cancer 2003  . Long term (current) use of anticoagulants   . Malignant neoplasm of kidney, except pelvis   . Osteoarthritis of right hip   . Other acute sinusitis   . Other and unspecified coagulation defects   . Other malaise and fatigue   . Other screening mammogram   . PONV (postoperative nausea and vomiting)   . Pre-operative cardiovascular examination   . Renal cancer ([place]) 06/2001   Right  . Secondary cardiomyopathy, unspecified   . Sinus bradycardia   . Special screening for osteoporosis   . Urge incontinence     Past Surgical History:  Procedure Laterality Date  . ABDOMINAL HYSTERECTOMY  1975   for benign causes  . APPENDECTOMY    . BI-VENTRICULAR PACEMAKER UPGRADE  05/04/2010  . CARDIAC CATHETERIZATION  2006  . CARDIOVERSION N/A 02/20/2018   Procedure: CARDIOVERSION;  Surgeon: Minna Merritts, MD;  Location: [facility];  Service: Cardiovascular;  Laterality: N/A;  . CARDIOVERSION N/A 03/27/2018   Procedure: CARDIOVERSION;  Surgeon: Minna Merritts, MD;  Location: [facility];  Service: Cardiovascular;  Laterality: N/A;  . CATARACT EXTRACTION W/ INTRAOCULAR LENS  IMPLANT, BILATERAL  01/2006-02-2006  . CHOLECYSTECTOMY N/A 11/11/2012   Procedure: LAPAROSCOPIC CHOLECYSTECTOMY WITH INTRAOPERATIVE CHOLANGIOGRAM;  Surgeon: Imogene Burn. Georgette Dover, MD;  Location: [facility];  Service: General;  Laterality: N/A;  . EP IMPLANTABLE DEVICE N/A 02/21/2016   Procedure: ICD Generator Changeout;  Surgeon: Deboraha Sprang, MD;  Location: [facility];  Service: Cardiovascular;  Laterality: N/A;  . INSERT / REPLACE / REMOVE PACEMAKER  05-01-11   02-28-05-/05-04-10-ICD-MEDTRONIC MAXIMAL DR  . JOINT REPLACEMENT    . LAPAROSCOPIC CHOLECYSTECTOMY  11/11/2012  . LAPAROSCOPIC LYSIS OF ADHESIONS N/A 11/11/2012   Procedure: LAPAROSCOPIC LYSIS OF ADHESIONS;  Surgeon: Imogene Burn. Georgette Dover, MD;  Location: [facility];   Service: General;  Laterality: N/A;  . NEPHRECTOMY Right 06/2001    S/P RENAL CELL CANCER  . TOTAL HIP ARTHROPLASTY Right 05/03/2011   Procedure: TOTAL HIP ARTHROPLASTY ANTERIOR APPROACH;  Surgeon: Mcarthur Rossetti;  Location: [facility];  Service: Orthopedics;  Laterality: Right;  Removal of Cannulated Screws Right Hip, Right Direct Anterior Hip Replacement     reports that she quit smoking about 17 years ago. Her smoking use included cigarettes. She has a 20.00 pack-year smoking history. She has never used smokeless tobacco. She reports that she does not drink alcohol or use drugs.  Allergies  Allergen Reactions  . Codeine Nausea And Vomiting  . Morphine And Related Nausea And Vomiting  . Sulfonamide Derivatives Other (See Comments)    Dry mouth    Family History  Problem Relation Age of Onset  . Heart failure Mother   . Breast cancer Maternal Aunt 82  . Breast cancer Cousin   . Breast cancer Other   . Colon cancer Neg Hx     Prior to Admission medications   Medication Sig Start Date End Date Taking? Authorizing Provider  cephALEXin (KEFLEX) 500 MG capsule Take 1 capsule (500 mg total) by mouth every 12 (twelve) hours. 10/05/18  Yes Elby Beck, FNP  conjugated estrogens (PREMARIN) vaginal cream Place 1 Applicatorful vaginally daily.  07/31/18  Yes Elby Beck, FNP  ezetimibe (ZETIA) 10 MG tablet Take 1 tablet (10 mg total) by mouth daily. 08/18/17  Yes Gollan, Kathlene November, MD  fluticasone (FLONASE) 50 MCG/ACT nasal spray Place 2 sprays into both nostrils daily. 04/22/18  Yes Elby Beck, FNP  levothyroxine (SYNTHROID) 25 MCG tablet Take 1 tablet (25 mcg total) by mouth daily before breakfast. 09/22/18  Yes Deboraha Sprang, MD  magnesium oxide (MAG-OX) 400 MG tablet Take 1 tablet (400 mg total) by mouth daily. 06/05/18  Yes Deboraha Sprang, MD  metolazone (ZAROXOLYN) 5 MG tablet Take 1 tablet (5 mg total) once a week. Patient taking differently: Take 5 mg by mouth  See admin instructions. Take 5 mg twice a week 08/25/18  Yes Deboraha Sprang, MD  potassium chloride SA (K-DUR) 20 MEQ tablet Take 1 tablet (20 meq) by mouth twice daily, except on metolazone days take 2 tablets (40 meq) twice daily 10/02/18  Yes Deboraha Sprang, MD  rosuvastatin (CRESTOR) 40 MG tablet Take 1 tablet (40 mg total) by mouth daily. 11/11/16  Yes Minna Merritts, MD  torsemide (DEMADEX) 20 MG tablet Take 2 tablets (40 mg) by mouth once daily. 08/25/18  Yes Deboraha Sprang, MD  acetaminophen (TYLENOL) 500 MG tablet Take 1,000 mg by mouth daily as needed for headache.     [provider]  cetirizine (ZYRTEC) 10 MG tablet Take 10 mg by mouth daily.     [provider]  CRANBERRY PO Take 4 tablets by mouth daily.    [provider]  hydrocortisone 2.5 % cream Apply 1 application topically daily as needed (itching).  02/04/18   [provider]  spironolactone (ALDACTONE) 25 MG tablet Take 0.5 tablets (12.5 mg total) by mouth daily. Patient not taking: Reported on 10/06/2018 08/06/18 11/04/18  Deboraha Sprang, MD  warfarin (COUMADIN) 2 MG tablet Take 1 1/2 tablets daily or AS DIRECTED BY ANTICOAGULATION CLINIC. 12/29/17   Elby Beck, FNP    Physical Exam: Vitals:   10/06/18 2140 10/06/18 2210 10/06/18 2230 10/07/18 0030  BP:  112/87 131/63 (!) 137/97  Pulse:   89 89  Resp:  (!) 21 20 18   Temp:      TempSrc:      SpO2:  96% 94% 95%  Weight: 78 kg     Height: 5\' 6"  (1.676 m)       GENERAL: 85 y.o.-year-old female patient, well-developed, well-nourished lying in the bed in no acute distress.  Pleasant and cooperative.   HEENT: Head atraumatic, normocephalic. Pupils equal. Mucus membranes moist. NECK: Supple. No JVD. CHEST: Mild bibasilar rales.  No use of accessory muscles of respiration.  No reproducible chest wall tenderness.  CARDIOVASCULAR: S1, S2 normal. No murmurs, rubs, or gallops. Cap refill <2 seconds. Pulses intact distally.  ABDOMEN:  Soft, nondistended, nontender. No rebound, guarding, rigidity. Normoactive bowel sounds present in all four quadrants.  EXTREMITIES: Tenderness palpation over the left hip.  Left leg is shortened and externally rotated.  Significant pitting pedal edema to the midcalf bilaterally and chronic venous stasis discoloration.  No calf tenderness or Homan's sign.  NEUROLOGIC: The patient is alert and oriented x 3. Cranial nerves II through XII are grossly intact with no focal sensorimotor deficit. PSYCHIATRIC:  Normal affect, mood, thought content. SKIN: Warm, dry, and intact without obvious rash, lesion, or ulcer.    Labs on Admission:  CBC: Recent Labs  Lab 09/30/18 1205 10/06/18 2220  WBC 4.1 7.4  NEUTROABS 2.5 6.6  HGB 8.2* 8.0*  HCT 30.2* 29.6*  MCV 65.4* 65.9*  PLT 303 599   Basic Metabolic Panel: Recent Labs  Lab 09/30/18 1205 10/06/18 2220  NA 138 137  K 3.4* 2.8*  CL 95* 96*  CO2 32 31  GLUCOSE 115* 139*  BUN 17 17  CREATININE 1.27* 1.26*  CALCIUM 8.8* 8.7*  MG 1.9 1.9   GFR: Estimated Creatinine Clearance: 36.3 mL/min (A) (by C-G formula based on SCr of 1.26 mg/dL (H)). Liver Function Tests: No results for input(s): AST, ALT, ALKPHOS, BILITOT, PROT, ALBUMIN in the last 168 hours. No results for input(s): LIPASE, AMYLASE in the last 168 hours. No results for input(s): AMMONIA in the last 168 hours. Coagulation Profile: Recent Labs  Lab 10/06/18 2220  INR 1.8*   Cardiac Enzymes: No results for input(s): CKTOTAL, CKMB, CKMBINDEX, TROPONINI in the last 168 hours. BNP (last 3 results) No results for input(s): PROBNP in the last 8760 hours. HbA1C: No results for input(s): HGBA1C in the last 72 hours. CBG: No results for input(s): GLUCAP in the last 168 hours. Lipid Profile: No results for input(s): CHOL, HDL, LDLCALC, TRIG, CHOLHDL, LDLDIRECT in the last 72 hours. Thyroid Function Tests: No results for input(s): TSH, T4TOTAL, FREET4, T3FREE, THYROIDAB in the  last 72 hours. Anemia Panel: No results for input(s): VITAMINB12, FOLATE, FERRITIN, TIBC, IRON, RETICCTPCT in the last 72 hours. Urine analysis:    Component Value Date/Time   COLORURINE YELLOW 08/16/2014 1754   APPEARANCEUR Cloudy (A) 01/27/2018 1330   LABSPEC 1.014 08/16/2014 1754   PHURINE 7.0 08/16/2014 1754   GLUCOSEU Negative 01/27/2018 1330   HGBUR MODERATE (A) 08/16/2014 1754   HGBUR large 11/16/2009 1202   BILIRUBINUR negative 10/05/2018 1435   BILIRUBINUR Negative 01/27/2018 1330   KETONESUR NEGATIVE 08/16/2014 1754   PROTEINUR Negative 10/05/2018 1435   PROTEINUR Negative 01/27/2018 1330   PROTEINUR NEGATIVE 08/16/2014 1754   UROBILINOGEN 0.2 10/05/2018 1435   UROBILINOGEN 1.0 08/16/2014 1754   NITRITE negative 10/05/2018 1435   NITRITE Negative 01/27/2018 1330   NITRITE NEGATIVE 08/16/2014 1754   LEUKOCYTESUR Small (1+) (A) 10/05/2018 1435   LEUKOCYTESUR 1+ (A) 01/27/2018 1330   Sepsis Labs: @LABRCNTIP (procalcitonin:4,lacticidven:4) ) Recent Results (from the past 240 hour(s))  Urine Culture     Status: None   Collection Time: 10/05/18  3:57 PM  Result Value Ref Range Status   MICRO NUMBER: [id number]  Final   SPECIMEN QUALITY: Adequate  Final   Sample Source URINE  Final   STATUS: FINAL  Final   Result:   Final    Three or more organisms present, each greater than 10,000 CFU/mL. May represent normal flora contamination from external genitalia. No further testing is required.  SARS Coronavirus 2 (CEPHEID- Performed in [facility] hospital lab), Hosp Order     Status: None   Collection Time: 10/06/18 10:16 PM  Result Value Ref Range Status   SARS Coronavirus 2 NEGATIVE NEGATIVE Final    Comment: (NOTE) If result is NEGATIVE SARS-CoV-2 target nucleic acids are NOT DETECTED. The SARS-CoV-2 RNA is generally detectable in upper and lower  respiratory specimens during the acute phase of infection. The lowest  concentration of SARS-CoV-2 viral copies this assay can  detect is 250  copies / mL. A negative result does not preclude SARS-CoV-2 infection  and should not be used as the sole basis for treatment or other  patient management decisions.  A negative result may occur  with  improper specimen collection / handling, submission of specimen other  than nasopharyngeal swab, presence of viral mutation(s) within the  areas targeted by this assay, and inadequate number of viral copies  (<250 copies / mL). A negative result must be combined with clinical  observations, patient history, and epidemiological information. If result is POSITIVE SARS-CoV-2 target nucleic acids are DETECTED. The SARS-CoV-2 RNA is generally detectable in upper and lower  respiratory specimens dur ing the acute phase of infection.  Positive  results are indicative of active infection with SARS-CoV-2.  Clinical  correlation with patient history and other diagnostic information is  necessary to determine patient infection status.  Positive results do  not rule out bacterial infection or co-infection with other viruses. If result is PRESUMPTIVE POSTIVE SARS-CoV-2 nucleic acids MAY BE PRESENT.   A presumptive positive result was obtained on the submitted specimen  and confirmed on repeat testing.  While 2019 novel coronavirus  (SARS-CoV-2) nucleic acids may be present in the submitted sample  additional confirmatory testing may be necessary for epidemiological  and / or clinical management purposes  to differentiate between  SARS-CoV-2 and other Sarbecovirus currently known to infect humans.  If clinically indicated additional testing with an alternate test  methodology [phone]) is advised. The SARS-CoV-2 RNA is generally  detectable in upper and lower respiratory sp ecimens during the acute  phase of infection. The expected result is Negative. Fact Sheet for Patients:  StrictlyIdeas.no Fact Sheet for Healthcare  Providers: BankingDealers.co.za This test is not yet approved or cleared by the [place] FDA and has been authorized for detection and/or diagnosis of SARS-CoV-2 by FDA under an Emergency Use Authorization (EUA).  This EUA will remain in effect (meaning this test can be used) for the duration of the COVID-19 declaration under Section 564(b)(1) of the Act, 21 U.S.C. section 360bbb-3(b)(1), unless the authorization is terminated or revoked sooner. Performed at [facility], [place] [address] [place], [place] [address]      Radiological Exams on Admission: Dg Chest 1 View  Result Date: 10/06/2018 CLINICAL DATA:  Hip fracture EXAM: CHEST  1 VIEW COMPARISON:  05/12/2018 FINDINGS: Unchanged position of left chest wall AICD leads. Mild cardiomegaly with hazy lower lobe predominant airspace opacities, possibly mild pulmonary edema. Limited assessment of left lung base due to positioning., but there is some degree of retrocardiac opacity. IMPRESSION: Mild cardiomegaly and mild pulmonary edema. Limited assessment of left lung base due to positioning, but some degree of retrocardiac opacity. Lateral view would be helpful for further evaluation if necessary. Electronically Signed   By: Ulyses Jarred M.D.   On: 10/06/2018 22:20   Dg Hip Unilat With Pelvis 2-3 Views Left  Result Date: 10/06/2018 CLINICAL DATA:  Fall EXAM: DG HIP (WITH OR WITHOUT PELVIS) 2-3V LEFT COMPARISON:  None. FINDINGS: Minimally displaced fracture of the left femoral neck with foreshortening. Femoral head remains situated in the acetabulum. Normal appearance of right total hip arthroplasty. No pelvic ring fracture. IMPRESSION: Minimally displaced left femoral neck fracture with foreshortening. Electronically Signed   By: Ulyses Jarred M.D.   On: 10/06/2018 22:18    EKG: Atrial fibrillation at 91 bpm with normal axis and nonspecific ST-T wave changes.   Assessment/Plan  This is a 85 y.o.  female with a history of  chronic diastolic heart failure with pacemaker, atrial fibrillation on Coumadin, CKD, CAD, TIA, chronic anemia recurrent urinary tract infection now being admitted with:  #.  Left hip fracture following mechanical  fall -Admit inpatient N.p.o. after midnight Surgical consult has been placed by the emergency department physician.  Dr. Doren Custard to see the patient.  #.  Moderate hypokalemia -Replace orally Check magnesium  #.  Hypoxia, likely secondary to CHF but will need to rule out PE Check d-dimer, if positive will need bilateral lower extremity Dopplers as well as CTA to rule out PE - IV Lasix - Intake and consult - Cardiology consult for preop management - Check echo.   #. History of CKD, at baseline - Continue monitor kidney function  #. History of anemia, at baseline - Continue monitor CBC  #. History of UTI - Continue Keflex  #. History of HLD - Continue Zetia, Crestor  #. History of atrial fibrillation - Change coumadin to heparin per pharmacy  Admission status: Inpatient IV Fluids: HL Diet/Nutrition: NPO Consults called: Dr. Doren Custard cardiology  DVT Px: Heparin Code Status: Full Code  Disposition Plan: To be determined  All the records are reviewed and case discussed with ED provider. Management plans discussed with the patient and/or family who express understanding and agree with plan of care.  Maxten Shuler D.O. on 10/07/2018 at 1:01 AM CC: Primary care physician; Elby Beck, [place]   10/07/2018, 1:01 AM

## 2018-10-07 NOTE — Progress Notes (Signed)
Lajeana Strough is a 85 y.o. female with a known history of chronic diastolic heart failure with pacemaker, atrial fibrillation on Coumadin, CKD, CAD, TIA, chronic anemia, recurrent urinary tract infection , remote history of renal cell cancer status post nephrectomy 2003 presents to the emergency department for evaluation of left hip pain status post mechanical fall.  She was found to have minimally displaced left femoral neck fracture.  Orthopedics consulted.  Since she had extensive cardiac history, cardiology consulted for clearance for surgery. Apparently cardiology has cleared for surgery for tomorrow.  For now, we will continue IV diuresis because of pleural effusion as per CT chest.She also has some peripheral edema Currently she is hemodynamically stable.  Pain well controlled. We will start her on diet.  We will keep her n.p.o. after midnight. Patient seen by Dr. Ara Kussmaul this morning.

## 2018-10-07 NOTE — Progress Notes (Signed)
SPOKE W/   Called. No voicemail available to leave message     SCREENING SYMPTOMS OF COVID 19:   COUGH--  RUNNY NOSE---   SORE THROAT---  NASAL CONGESTION----  SNEEZING----  SHORTNESS OF BREATH---  DIFFICULTY BREATHING---  TEMP >100.0 -----  UNEXPLAINED BODY ACHES------  CHILLS --------   HEADACHES ---------  LOSS OF SMELL/ TASTE --------    HAVE YOU OR ANY FAMILY MEMBER TRAVELLED PAST 14 DAYS OUT OF THE   COUNTY--- STATE---- COUNTRY----  HAVE YOU OR ANY FAMILY MEMBER BEEN EXPOSED TO ANYONE WITH COVID 19?

## 2018-10-07 NOTE — Telephone Encounter (Signed)
Patient granddaughter calling States that patient will be going into a procedure tomorrow at 3pm at [facility] Is not sure if Dr Caryl Comes will be doing procedure but would like to know if there is anything we can do to allow her to see patient before - they are not allowing visitors  Please call to discuss

## 2018-10-07 NOTE — Progress Notes (Signed)
[facility] for Heparin Indication: Changing from Coumadin to heparin pending surgery.  Allergies  Allergen Reactions  . Codeine Nausea And Vomiting  . Morphine And Related Nausea And Vomiting  . Sulfonamide Derivatives Other (See Comments)    Dry mouth    Patient Measurements: Height: 5\' 6"  (167.6 cm) Weight: 169 lb 8.5 oz (76.9 kg) IBW/kg (Calculated) : 59.3 Heparin Dosing Weight: 75 kg  Vital Signs: Temp: 97.9 F (36.6 C) (06/03 0905) Temp Source: Oral (06/03 0905) BP: 123/61 (06/03 0905) Pulse Rate: 68 (06/03 0905)  Labs: Recent Labs    10/06/18 2220 10/07/18 0539 10/07/18 1137  HGB 8.0* 8.3*  --   HCT 29.6* 30.5*  --   PLT 299 260  --   LABPROT 20.4*  --   --   INR 1.8*  --   --   HEPARINUNFRC  --   --  0.27*  CREATININE 1.26* 1.20*  --     Estimated Creatinine Clearance: 37.8 mL/min (A) (by C-G formula based on SCr of 1.2 mg/dL (H)).  Assessment: Patient with chronic warfarin for afib with INR on admit of 1.8   Last coumadin clinic INR 5/21 = 1.5. Dose 3 mg x 1 then 2 mg daily. Coumadin dose per med hx: 2 mg daily x 1 mg on Sunday. Last dose 6/1 per med hx    10/07/2018 First heparin level is slightly low at 0.27 after heparin started with no bolus at rate of 1050 units/hr CBC stable  No bleeding reported  Goal of Therapy:  Heparin level 0.3-0.7 units/ml Monitor platelets by anticoagulation protocol: Yes   Plan:  Increase Heparin drip to 1200 units/hr and check 8 hr HL Daily CBC & heparin level F/u timing of [place], Pharm.D [phone] 10/07/2018 1:45 PM

## 2018-10-07 NOTE — Consult Note (Addendum)
Cardiology Consult    Patient ID: SEVYN MARKHAM MRN: [id number], DOB/AGE: Mar 08, 1934   Admit date: 10/06/2018 Date of Consult: 10/07/2018  Primary Physician: Elby Beck, [place] Primary Cardiologist: Virl Axe, MD Requesting Provider: Harvie Bridge, DO  Patient Profile    [name] is a 85 y.o. female with a history of non-obstructive CAD by cardiac catheterization in 5027, chronic diastolic CHF s/p ICD, permanent atrial fibrillation having failed multiple antiarrhythmic drugs and on Coumadin, bilateral carotid stenosis, hypertension, hyperlipidemia, and renal cancer, who presented on 10/06/2018 for left hip pain after a mechanical fall and was found to have a left hip fracture. Cardiology was consulted for pre-operative evaluation at the request of Dr. Ara Kussmaul.  History of Present Illness    [name] is a 85 year old female with the above history who is followed by Dr. Caryl Comes. Patient last saw Dr. Caryl Comes on 09/22/2018 for a virtual visit at which time she reported continued problems with peripheral edema that transiently improves with doses of Metolazone and then recurs. She also reported dyspnea on exertion and overall fatigue as well as episodes of atypical chest pain that are aggravated by movement of her left arm but unrelated to walking. Patient was advised to take two doses of Metolazone 5mg  that week and Torsemide 40mg  daily was continued. Synthroid was also added due to persistent mildly elevated TSH over the last few month. Patient was seen by her PCP's office via virtual visit on 10/05/2018 for UTI symptoms (dysuria, hematuria, and lower abdominal pressure for the prior 5 days). She also noted a fever of 100.8 the day before. Patient has a history of frequent UTIs so she was started on Keflex while awaiting urinary culture. Patient had outpatient Echo yesterday which showed LVEF of 60-65% with normal RV systolic function with severely elevated RVSP of 93.2  mmHg.  Patient presented to the ED yesterday via EMS for evaluation of left hip pain after a fall. Upon EMS arrival, patient was noted to be in some respiratory distress and in atrial fibrillation. O2 sats initially in the 80's. Patient was placed on 6L of supplemental O2 via nasal cannula but this was able to be weaned en route to the ED.  Upon arrival to the ED, O2 sats in the low 90's on 2 L of nasal cannula but vitals stable. EKG showed rate controlled atrial fibrillation with no acute ischemic changes compared to prior tracings. Chest x-ray showed mild cardiomegaly and mild pulmonary edema. BNP mildly elevated at 235.7. D-dimer significantly elevated at 16.06. Chest CTA showed cardiomegaly with moderate bilateral layering pleural effusion and ground-glass opacity in the left more than right lung felt to likely be edema but no evidence of pulmonary embolism. Hip x-ray showed minimally displaced left femoral neck fracture with shortening. WBC 7.4, Hgb 8.0, Plts 299. Na 137, K 2.8, Glucose 139, SCr 1.26. Patient was admitted and Ortho was consulted. Plan is for surgery once patient has been medically optimized from a heart failure standpoint. Cardiology was consulted for pre-operative evaluation and to help with management of acute on chronic diastolic CHF.  Saw patient with Dr. Debara Pickett. Patient reports she was walking into her house yesterday wearing flip flops and tripped on the door mat and fell landing on her left hip. She denies feeling very weak, lightheaded, or dizziness prior to the fall. She continue to have the same atypical chest pain that she reported to Dr. Caryl Comes a couple of weeks ago. She continues to have dyspnea  on exertion, significant lower extremity swelling, and stable orthopnea (that is worse around the time she is due for Metolazone dose). She reports that sometimes her legs are so swollen that they are hard to move. She does note some weight loss recently and reports early satiety and  nausea with eating. Her PCP reportedly started a work-up for this but it has been delayed due to the coronavirus. Patient lives along and is able to perform activities of daily living independently.   Past Medical History   Past Medical History:  Diagnosis Date   Adjustment disorder with anxiety    Anginal pain ([place])    Arthritis    "some in my hands" (11/11/2012)   Atrial fibrillation (HCC)    Automatic implantable cardioverter-defibrillator in situ    Blood in stool    Carotid artery stenosis 09/2007   60-79% bilateral (stable)   Carotid artery stenosis 10/2008   40-59% R 60-79%    Chest pain, unspecified    Chronic airway obstruction, not elsewhere classified    Chronic bronchitis ([place])    "get it some; not q year" (11/11/2012)   Chronic diastolic CHF (congestive heart failure) ([place]) 06/04/2013   Chronic kidney disease, unspecified    Coronary artery disease    non-obstructive by 2006 cath   Dysuria    Edema    Encounter for therapeutic drug monitoring    Exertional shortness of breath    Frequent UTI    "get them a couple times/yr" (11/11/2012)   Hemorrhage of rectum and anus    High cholesterol    History of blood transfusion 04/2011   "after hip OR" (11/11/2012)   Hypertension 05/20/2011   Hypertr obst cardiomyop    Hypotension, unspecified    cardiac cath 2006..nonobstructive CAD 30-40s lesions.Marland KitchenETT 1/09 nondiagnostic due to poor HR response..Right Renal Cancer 2003   Long term (current) use of anticoagulants    Malignant neoplasm of kidney, except pelvis    Osteoarthritis of right hip    Other acute sinusitis    Other and unspecified coagulation defects    Other malaise and fatigue    Other screening mammogram    PONV (postoperative nausea and vomiting)    Pre-operative cardiovascular examination    Renal cancer ([place]) 06/2001   Right   Secondary cardiomyopathy, unspecified    Sinus bradycardia    Special screening for osteoporosis     Urge incontinence     Past Surgical History:  Procedure Laterality Date   ABDOMINAL HYSTERECTOMY  1975   for benign causes   APPENDECTOMY     BI-VENTRICULAR PACEMAKER UPGRADE  05/04/2010   CARDIAC CATHETERIZATION  2006   CARDIOVERSION N/A 02/20/2018   Procedure: CARDIOVERSION;  Surgeon: Minna Merritts, MD;  Location: [facility];  Service: Cardiovascular;  Laterality: N/A;   CARDIOVERSION N/A 03/27/2018   Procedure: CARDIOVERSION;  Surgeon: Minna Merritts, MD;  Location: [facility];  Service: Cardiovascular;  Laterality: N/A;   CATARACT EXTRACTION W/ INTRAOCULAR LENS  IMPLANT, BILATERAL  01/2006-02-2006   CHOLECYSTECTOMY N/A 11/11/2012   Procedure: LAPAROSCOPIC CHOLECYSTECTOMY WITH INTRAOPERATIVE CHOLANGIOGRAM;  Surgeon: Imogene Burn. Georgette Dover, MD;  Location: [place];  Service: General;  Laterality: N/A;   EP IMPLANTABLE DEVICE N/A 02/21/2016   Procedure: ICD Generator Changeout;  Surgeon: Deboraha Sprang, MD;  Location: [place] [facility];  Service: Cardiovascular;  Laterality: N/A;   INSERT / REPLACE / REMOVE PACEMAKER  05-01-11   02-28-05-/05-04-10-ICD-MEDTRONIC MAXIMAL DR   JOINT REPLACEMENT     LAPAROSCOPIC  CHOLECYSTECTOMY  11/11/2012   LAPAROSCOPIC LYSIS OF ADHESIONS N/A 11/11/2012   Procedure: LAPAROSCOPIC LYSIS OF ADHESIONS;  Surgeon: Imogene Burn. Georgette Dover, MD;  Location: [place];  Service: General;  Laterality: N/A;   NEPHRECTOMY Right 06/2001    S/P RENAL CELL CANCER   TOTAL HIP ARTHROPLASTY Right 05/03/2011   Procedure: TOTAL HIP ARTHROPLASTY ANTERIOR APPROACH;  Surgeon: Mcarthur Rossetti;  Location: [facility];  Service: Orthopedics;  Laterality: Right;  Removal of Cannulated Screws Right Hip, Right Direct Anterior Hip Replacement     Allergies  Allergies  Allergen Reactions   Codeine Nausea And Vomiting   Morphine And Related Nausea And Vomiting   Sulfonamide Derivatives Other (See Comments)    Dry mouth    Inpatient Medications     cephALEXin  500 mg Oral Q12H    ezetimibe  10 mg Oral Daily   fluticasone  2 spray Each Nare Daily   furosemide  40 mg Intravenous Daily   levothyroxine  25 mcg Oral Q0600   loratadine  10 mg Oral Daily   magnesium oxide  400 mg Oral Daily   rosuvastatin  40 mg Oral Daily   sodium chloride (PF)        Family History    Family History  Problem Relation Age of Onset   Heart failure Mother    Breast cancer Maternal Aunt 82   Breast cancer Cousin    Breast cancer Other    Colon cancer Neg Hx    She indicated that her mother is deceased. She indicated that her father is deceased. She indicated that her maternal grandmother is deceased. She indicated that her maternal grandfather is deceased. She indicated that her paternal grandmother is deceased. She indicated that her paternal grandfather is deceased. She indicated that the status of her maternal aunt is unknown. She indicated that the status of her cousin is unknown. She indicated that the status of her neg hx is unknown. She indicated that her other is alive.   Social History    Social History   Socioeconomic History   Marital status: Widowed    Spouse name: Not on file   Number of children: Not on file   Years of education: Not on file   Highest education level: Not on file  Occupational History   Occupation: Retired    Fish farm manager: RETIRED  Scientist, product/process development strain: Not on file   Food insecurity:    Worry: Not on file    Inability: Not on file   Transportation needs:    Medical: Not on file    Non-medical: Not on file  Tobacco Use   Smoking status: Former Smoker    Packs/day: 0.50    Years: 40.00    Pack years: 20.00    Types: Cigarettes    Last attempt to quit: 05/06/2001    Years since quitting: 17.4   Smokeless tobacco: Never Used  Substance and Sexual Activity   Alcohol use: No    Alcohol/week: 0.0 standard drinks   Drug use: No   Sexual activity: Never  Lifestyle   Physical activity:    Days  per week: Not on file    Minutes per session: Not on file   Stress: Not on file  Relationships   Social connections:    Talks on phone: Not on file    Gets together: Not on file    Attends religious service: Not on file    Active member of club or  organization: Not on file    Attends meetings of clubs or organizations: Not on file    Relationship status: Not on file   Intimate partner violence:    Fear of current or ex partner: Not on file    Emotionally abused: Not on file    Physically abused: Not on file    Forced sexual activity: Not on file  Other Topics Concern   Not on file  Social History Narrative   WIDOW   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   ALCOHOL USE-NO   RETIRED   FORMER TOBACCO USE.....1/2 PACK X 40 YRS UNTIL 2003   Would desire CPR     Review of Systems    Please see HPI. Otherwise, negative.  Physical Exam    Blood pressure 123/61, pulse 68, temperature 97.9 F (36.6 C), temperature source Oral, resp. rate 16, height 5\' 6"  (1.676 m), weight 76.9 kg, SpO2 97 %.   Physical Exam per MD:  General: 85 y.o. female resting comfortably in no acute distress. Pleasant and cooperative. HEENT: Normal  Neck: Supple. . Lungs: No increased work of breathing. Lung relatively clear to auscultation bilaterally. No significant wheezes, rhonchi, or rales. Heart: Very distant heart sound. No murmurs, gallops, or rubs.  Abdomen: Soft, non-distended, and non-tender to palpation. Bowel sounds present. Extremities: 2+ pitting edema of bilateral lower extremities edema.  Skin: Warm and dry. Neuro: Alert and oriented x3. No focal deficits. Moves all extremities spontaneously. Psych: Normal affect. Responds appropriately.   Labs    Troponin (Point of Care Test) No results for input(s): TROPIPOC in the last 72 hours. No results for input(s): CKTOTAL,  CKMB, TROPONINI in the last 72 hours. Lab Results  Component Value Date   WBC 5.6 10/07/2018   HGB 8.3 (L) 10/07/2018   HCT 30.5 (L) 10/07/2018   MCV 65.6 (L) 10/07/2018   PLT 260 10/07/2018    Recent Labs  Lab 10/07/18 0539  NA 134*  K 3.9  CL 95*  CO2 29  BUN 16  CREATININE 1.20*  CALCIUM 7.9*  GLUCOSE 154*   Lab Results  Component Value Date   CHOL 179 03/19/2016   HDL 39.10 03/19/2016   LDLCALC 118 (H) 03/19/2016   TRIG 110.0 03/19/2016   Lab Results  Component Value Date   DDIMER 16.06 (H) 10/07/2018     Radiology Studies    Dg Chest 1 View  Result Date: 10/06/2018 CLINICAL DATA:  Hip fracture EXAM: CHEST  1 VIEW COMPARISON:  05/12/2018 FINDINGS: Unchanged position of left chest wall AICD leads. Mild cardiomegaly with hazy lower lobe predominant airspace opacities, possibly mild pulmonary edema. Limited assessment of left lung base due to positioning., but there is some degree of retrocardiac opacity. IMPRESSION: Mild cardiomegaly and mild pulmonary edema. Limited assessment of left lung base due to positioning, but some degree of retrocardiac opacity. Lateral view would be helpful for further evaluation if necessary. Electronically Signed   By: Ulyses Jarred M.D.   On: 10/06/2018 22:20   Ct Angio Chest Pe W Or Wo Contrast  Result Date: 10/07/2018 CLINICAL DATA:  Hypoxia and elevated D-dimer. EXAM: CT ANGIOGRAPHY CHEST WITH CONTRAST TECHNIQUE: Multidetector CT imaging of the chest was performed using the standard protocol during bolus administration of intravenous contrast. Multiplanar CT image reconstructions and MIPs were obtained to evaluate the vascular anatomy. CONTRAST:  66mL OMNIPAQUE IOHEXOL 350 MG/ML SOLN COMPARISON:  Chest x-ray from yesterday.  Chest CT 06/07/2010 FINDINGS: Cardiovascular: Cardiomegaly without pericardial effusion. Biventricular pacer from the left with 2 right ventricular ICD leads. Negative for pulmonary artery filling defect.  Mediastinum/Nodes: Negative for mass or adenopathy Lungs/Pleura: Moderate layering pleural effusions on both sides. There is tracheobronchomalacia with generalized intermittently seen airway collapse. Dependent atelectasis. Ground-glass opacity asymmetric to the left lung with interlobular septal thickening. There are a few small subpleural nodules. Negative for lung mass. Upper Abdomen: Intrahepatic venous reflux. Musculoskeletal: No acute or aggressive finding Review of the MIP images confirms the above findings. IMPRESSION: 1. Cardiomegaly with moderate bilateral layering pleural effusion. 2. Ground-glass opacity in the left more than right lung, likely edema. 3. Negative for pulmonary embolism. Electronically Signed   By: Monte Fantasia M.D.   On: 10/07/2018 04:38   Dg Hip Unilat With Pelvis 2-3 Views Left  Result Date: 10/06/2018 CLINICAL DATA:  Fall EXAM: DG HIP (WITH OR WITHOUT PELVIS) 2-3V LEFT COMPARISON:  None. FINDINGS: Minimally displaced fracture of the left femoral neck with foreshortening. Femoral head remains situated in the acetabulum. Normal appearance of right total hip arthroplasty. No pelvic ring fracture. IMPRESSION: Minimally displaced left femoral neck fracture with foreshortening. Electronically Signed   By: Ulyses Jarred M.D.   On: 10/06/2018 22:18   Vas [place] Lower Extremity Venous (dvt)  Result Date: 10/07/2018  Lower Venous Study Indications: Swelling.  Limitations: Body habitus, poor ultrasound/tissue interface and poor patient compliance secondary to pain. Performing Technologist: Maudry Mayhew MHA, RDMS, RVT, RDCS  Examination Guidelines: A complete evaluation includes B-mode imaging, spectral Doppler, color Doppler, and power Doppler as needed of all accessible portions of each vessel. Bilateral testing is considered an integral part of a complete examination. Limited examinations for reoccurring indications may be performed as noted.   +---------+---------------+---------+-----------+----------+--------------+  RIGHT     Compressibility Phasicity Spontaneity Properties Summary         +---------+---------------+---------+-----------+----------+--------------+  CFV       Full            No        Yes                    pulsatile flow  +---------+---------------+---------+-----------+----------+--------------+  SFJ       Full                                                             +---------+---------------+---------+-----------+----------+--------------+  FV Prox   Full                                                             +---------+---------------+---------+-----------+----------+--------------+  FV Mid    Full                                                             +---------+---------------+---------+-----------+----------+--------------+  FV Distal Full                                                             +---------+---------------+---------+-----------+----------+--------------+  PFV       Full                                                             +---------+---------------+---------+-----------+----------+--------------+  POP       Full            No        Yes                    pulsatile flow  +---------+---------------+---------+-----------+----------+--------------+  PTV       Full                                                             +---------+---------------+---------+-----------+----------+--------------+  PERO      Full                                                             +---------+---------------+---------+-----------+----------+--------------+  +---------+---------------+---------+-----------+----------+-------------------+  LEFT      Compressibility Phasicity Spontaneity Properties Summary              +---------+---------------+---------+-----------+----------+-------------------+  CFV       Full            No        Yes                    pulsatile flow        +---------+---------------+---------+-----------+----------+-------------------+  SFJ       Full                                                                  +---------+---------------+---------+-----------+----------+-------------------+  FV Prox   Full                                                                  +---------+---------------+---------+-----------+----------+-------------------+  FV Mid    Full                                                                  +---------+---------------+---------+-----------+----------+-------------------+  FV Distal Full                                                                  +---------+---------------+---------+-----------+----------+-------------------+  PFV       Full                                                                  +---------+---------------+---------+-----------+----------+-------------------+  POP       Full            No        Yes                    pulsatile flow       +---------+---------------+---------+-----------+----------+-------------------+  PTV                                 Yes                    patent by color                                                                  Doppler- patient                                                                 unable to tolerate                                                               compression                                                                      maneuvers            +---------+---------------+---------+-----------+----------+-------------------+  PERO                                Yes                    patent by color                                                                  Doppler- patient  unable to tolerate                                                               compression                                                                      maneuvers             +---------+---------------+---------+-----------+----------+-------------------+  Summary: Right: There is no evidence of deep vein thrombosis in the lower extremity. No cystic structure found in the popliteal fossa. Left: There is no evidence of deep vein thrombosis in the lower extremity. However, portions of this examination were limited- see technologist comments above. No cystic structure found in the popliteal fossa.  *See table(s) above for measurements and observations.    Preliminary     EKG     EKG: EKG was personally reviewed and demonstrates: Rate controlled atrial fibrillation with no acute ischemic changes compared to prior tracings. Telemetry: Telemetry was personally reviewed and demonstrates: Patient not on telemetry.  Cardiac Imaging    Echocardiogram 10/06/2018: Impressions: 1. The left ventricle has normal systolic function with an ejection fraction of 60-65%. The cavity size was normal. Left ventricular diastolic function could not be evaluated due to nondiagnostic images.  2. The right ventricle has normal systolic function. The cavity was mildly enlarged. There is moderately increased right ventricular wall thickness. Right ventricular systolic pressure is severely elevated with an estimated pressure of 93.2 mmHg.  3. Left atrial size was mildly dilated.  4. The mitral valve is degenerative. Mild thickening of the mitral valve leaflet.  5. The tricuspid valve is not well visualized. Tricuspid valve regurgitation is mild-moderate.  6. The aortic valve was not well visualized. Mild thickening of the aortic valve. Aortic valve regurgitation is trivial by color flow Doppler.  7. The inferior vena cava was dilated in size with <50% respiratory variability.  Assessment & Plan    Pre-Operative Evaluation - Patient presented for left hip pain following witnessed mechanical fall and was found to have a left femoral neck fracture. Ortho is planning for surgery. Cardiology consulted  for pre-operative evaluation and to assist with heart failure management. - EKG showed no acute ischemia. - BNP elevated in the 200's and chest x-ray suggestive of CHF.  - Echo yesterday showed LVEF of 60-65% with normal RV systolic function with severely elevated RVSP of 93.2 mmHg. - Per Revised Cardiac Risk Index, considered low to moderate risk; however, this does not take into account the patient's age, solitary kidney, or severe pulmonary hypertension. Likely more of a moderate risk. - Patient lives along and is able to take care of all activities of daily living. Functional capacity limited some by significant peripheral edema and generalized weakness.  - Discussed with Dr. Debara Pickett. No need for any additional cardiac work-up prior to surgery. OK to proceed. Will diuresis some beforehand.  Acute on Chronic Diastolic CHF - BNP elevated in the 200's and chest x-ray suggestive of CHF.  - Echo showed LVEF of 60-65%. -  Patient on Torsemide 40mg  daily and Metolazone 5mg  once weekly. - Will diuresis with IV Lasix. Patient already started on IV Lasix 40mg  daily. Received one dose this around 9:00am. Will give additional dose tonight.  - Continue to monitor volume status closely.  Permanent Atrial Fibrillation - EKG shows rate controlled atrial fibrillation. - Patient has failed multiple anti-arrhythmic medications.  - Previously on Coreg but this was discontinued due to concern for low BP. - On Couamdin at home. This was held on admission and patient was started on IV Heparin.  Pulmonary Hypertension - Echo yesterday showed severely elevated RVSP 93.2 mmHg. Suspect this is contributing to patients dyspnea on exertion. - D-dimer was significant elevated but chest CTA negative for PE.  History of Renal Cancer s/p Nephrectomy / CKD Stage III - Creatinine 1.2 on admission. Baseline appears to be around 1.5. - Will recheck BMET in the morning.  Elevated D-Dimer - D-dimer significant elevated at  16.06. Chest CTA negative for PE and preliminary read on lower extremity dopplers negative for DVT.  Otherwise, per primary team. - Left femoral neck fracture - Hypokalemia - Anemia - History of frequent UTIs - Hyperlipidemia  Signed, Darreld Mclean, PA-C 10/07/2018, 11:09 AM Pager: [phone] For questions or updates, please contact   Please consult [email or web address] for contact info under Cardiology/STEMI.

## 2018-10-07 NOTE — Progress Notes (Signed)
PT Cancellation Note  Patient Details Name: [name] MRN: [id number] DOB: 09-May-1933   Cancelled Treatment:    Reason Eval/Treat Not Completed: Patient not medically ready - Pt with L femoral neck fracture, awaiting surgical recommendation and plan. PT to continue to follow acutely.   Julien Girt, PT Acute Rehabilitation Services Pager [phone]  Office [phone]    [name] 10/07/2018, 11:38 AM

## 2018-10-07 NOTE — Progress Notes (Signed)
ANTICOAGULATION CONSULT NOTE - Initial Consult  Pharmacy Consult for Heparin Indication: Changing from Coumadin to heparin pending surgery.  Allergies  Allergen Reactions  . Codeine Nausea And Vomiting  . Morphine And Related Nausea And Vomiting  . Sulfonamide Derivatives Other (See Comments)    Dry mouth    Patient Measurements: Height: 5\' 6"  (167.6 cm) Weight: 172 lb (78 kg) IBW/kg (Calculated) : 59.3 Heparin Dosing Weight:   Vital Signs: Temp: 97.9 F (36.6 C) (06/02 2139) Temp Source: Oral (06/02 2139) BP: 137/97 (06/03 0030) Pulse Rate: 89 (06/03 0030)  Labs: Recent Labs    10/06/18 2220  HGB 8.0*  HCT 29.6*  PLT 299  LABPROT 20.4*  INR 1.8*  CREATININE 1.26*    Estimated Creatinine Clearance: 36.3 mL/min (A) (by C-G formula based on SCr of 1.26 mg/dL (H)).   Medical History: Past Medical History:  Diagnosis Date  . Adjustment disorder with anxiety   . Anginal pain ([place])   . Arthritis    "some in my hands" (11/11/2012)  . Atrial fibrillation ([place])   . Automatic implantable cardioverter-defibrillator in situ   . Blood in stool   . Carotid artery stenosis 09/2007   60-79% bilateral (stable)  . Carotid artery stenosis 10/2008   40-59% R 60-79%   . Chest pain, unspecified   . Chronic airway obstruction, not elsewhere classified   . Chronic bronchitis ([place])    "get it some; not q year" (11/11/2012)  . Chronic diastolic CHF (congestive heart failure) ([place]) 06/04/2013  . Chronic kidney disease, unspecified   . Coronary artery disease    non-obstructive by 2006 cath  . Dysuria   . Edema   . Encounter for therapeutic drug monitoring   . Exertional shortness of breath   . Frequent UTI    "get them a couple times/yr" (11/11/2012)  . Hemorrhage of rectum and anus   . High cholesterol   . History of blood transfusion 04/2011   "after hip OR" (11/11/2012)  . Hypertension 05/20/2011  . Hypertr obst cardiomyop   . Hypotension, unspecified    cardiac cath  2006..nonobstructive CAD 30-40s lesions.Marland KitchenETT 1/09 nondiagnostic due to poor HR response..Right Renal Cancer 2003  . Long term (current) use of anticoagulants   . Malignant neoplasm of kidney, except pelvis   . Osteoarthritis of right hip   . Other acute sinusitis   . Other and unspecified coagulation defects   . Other malaise and fatigue   . Other screening mammogram   . PONV (postoperative nausea and vomiting)   . Pre-operative cardiovascular examination   . Renal cancer ([place]) 06/2001   Right  . Secondary cardiomyopathy, unspecified   . Sinus bradycardia   . Special screening for osteoporosis   . Urge incontinence     Medications:  (Not in a hospital admission)  Infusions:  . magnesium sulfate bolus IVPB 2 g (10/07/18 0043)  . potassium chloride 10 mEq (10/07/18 0057)    Assessment: Patient with chronic warfarin for afib with INR on admit of 1.8 and last dose 6/2 per family.    Goal of Therapy:  Heparin level 0.3-0.7 units/ml Monitor platelets by anticoagulation protocol: Yes   Plan:  No Heparin bolus Heparin drip at 1050 units/hr Daily CBC Next heparin level at [place], Shea Stakes [place] 10/07/2018,1:27 AM

## 2018-10-08 ENCOUNTER — Inpatient Hospital Stay (HOSPITAL_COMMUNITY): Payer: PPO

## 2018-10-08 ENCOUNTER — Encounter (HOSPITAL_COMMUNITY): Payer: Self-pay | Admitting: Anesthesiology

## 2018-10-08 ENCOUNTER — Other Ambulatory Visit: Payer: PPO

## 2018-10-08 DIAGNOSIS — R400 Somnolence: Secondary | ICD-10-CM

## 2018-10-08 DIAGNOSIS — D649 Anemia, unspecified: Secondary | ICD-10-CM

## 2018-10-08 LAB — BASIC METABOLIC PANEL
Anion gap: 12 (ref 5–15)
BUN: 27 mg/dL — ABNORMAL HIGH (ref 8–23)
CO2: 28 mmol/L (ref 22–32)
Calcium: 8.2 mg/dL — ABNORMAL LOW (ref 8.9–10.3)
Chloride: 94 mmol/L — ABNORMAL LOW (ref 98–111)
Creatinine, Ser: 1.52 mg/dL — ABNORMAL HIGH (ref 0.44–1.00)
GFR calc Af Amer: 37 mL/min — ABNORMAL LOW (ref >60)
GFR calc non Af Amer: 32 mL/min — ABNORMAL LOW (ref >60)
Glucose, Bld: 131 mg/dL — ABNORMAL HIGH (ref 70–99)
Potassium: 3.5 mmol/L (ref 3.5–5.1)
Sodium: 134 mmol/L — ABNORMAL LOW (ref 135–145)

## 2018-10-08 LAB — CBC
HCT: 28.3 % — ABNORMAL LOW (ref 36.0–46.0)
Hemoglobin: 7.5 g/dL — ABNORMAL LOW (ref 12.0–15.0)
MCH: 17.9 pg — ABNORMAL LOW (ref 26.0–34.0)
MCHC: 26.5 g/dL — ABNORMAL LOW (ref 30.0–36.0)
MCV: 67.4 fL — ABNORMAL LOW (ref 80.0–100.0)
Platelets: 275 10*3/uL (ref 150–400)
RBC: 4.2 MIL/uL (ref 3.87–5.11)
RDW: 20.9 % — ABNORMAL HIGH (ref 11.5–15.5)
WBC: 8 10*3/uL (ref 4.0–10.5)
nRBC: 0 % (ref 0.0–0.2)

## 2018-10-08 LAB — PROTIME-INR
INR: 1.5 — ABNORMAL HIGH (ref 0.8–1.2)
Prothrombin Time: 18.3 seconds — ABNORMAL HIGH (ref 11.4–15.2)

## 2018-10-08 LAB — PREPARE RBC (CROSSMATCH)

## 2018-10-08 LAB — HEPARIN LEVEL (UNFRACTIONATED): Heparin Unfractionated: 0.47 IU/mL (ref 0.30–0.70)

## 2018-10-08 LAB — GLUCOSE, CAPILLARY: Glucose-Capillary: 115 mg/dL — ABNORMAL HIGH (ref 70–99)

## 2018-10-08 MED ORDER — CHLORHEXIDINE GLUCONATE 4 % EX LIQD
60.0000 mL | Freq: Once | CUTANEOUS | Status: AC
  Administered 2018-10-08: 4 via TOPICAL
  Filled 2018-10-08: qty 60

## 2018-10-08 MED ORDER — HEPARIN (PORCINE) 25000 UT/250ML-% IV SOLN
1200.0000 [IU]/h | INTRAVENOUS | Status: DC
  Administered 2018-10-08: 1200 [IU]/h via INTRAVENOUS
  Filled 2018-10-08: qty 250

## 2018-10-08 MED ORDER — ENSURE PRE-SURGERY PO LIQD
296.0000 mL | Freq: Once | ORAL | Status: AC
  Administered 2018-10-08: 296 mL via ORAL
  Filled 2018-10-08: qty 296

## 2018-10-08 MED ORDER — TRAMADOL HCL 50 MG PO TABS
50.0000 mg | ORAL_TABLET | Freq: Two times a day (BID) | ORAL | Status: DC | PRN
  Filled 2018-10-08: qty 1

## 2018-10-08 MED ORDER — POVIDONE-IODINE 10 % EX SWAB: 2.0000 "application " | Freq: Once | CUTANEOUS | Status: DC

## 2018-10-08 MED ORDER — CEFAZOLIN SODIUM-DEXTROSE 2-4 GM/100ML-% IV SOLN
2.0000 g | INTRAVENOUS | Status: DC
  Filled 2018-10-08: qty 100

## 2018-10-08 MED ORDER — SODIUM CHLORIDE 0.9% IV SOLUTION
Freq: Once | INTRAVENOUS | Status: AC
  Administered 2018-10-08: 13:00:00 via INTRAVENOUS

## 2018-10-08 NOTE — Progress Notes (Signed)
Called pharmacy regarding patients heparin drip and surgery, spoke with Stanton Kidney, who informed me to speak with the surgeon regarding heparin drip. [facility] office called to page Dr. Veverly Fells. Awaiting return call back.

## 2018-10-08 NOTE — Progress Notes (Signed)
[facility] for Heparin Indication: Changing from Coumadin to heparin pending surgery.  Allergies  Allergen Reactions  . Codeine Nausea And Vomiting  . Morphine And Related Nausea And Vomiting  . Sulfonamide Derivatives Other (See Comments)    Dry mouth    Patient Measurements: Height: 5\' 6"  (167.6 cm) Weight: 169 lb 8.5 oz (76.9 kg) IBW/kg (Calculated) : 59.3 Heparin Dosing Weight: 75 kg  Vital Signs: Temp: 98.9 F (37.2 C) (06/04 0438) Temp Source: Oral (06/04 0438) BP: 116/45 (06/04 0438) Pulse Rate: 64 (06/04 0438)  Labs: Recent Labs    10/06/18 2220 10/07/18 0539 10/07/18 1137 10/07/18 2145 10/08/18 0600  HGB 8.0* 8.3*  --   --  7.5*  HCT 29.6* 30.5*  --   --  28.3*  PLT 299 260  --   --  275  LABPROT 20.4*  --   --   --   --   INR 1.8*  --   --   --   --   HEPARINUNFRC  --   --  0.27* 0.62 0.47  CREATININE 1.26* 1.20*  --   --  1.52*    Estimated Creatinine Clearance: 29.9 mL/min (A) (by C-G formula based on SCr of 1.52 mg/dL (H)).  Assessment: Patient with chronic warfarin for afib with INR on admit of 1.8   Last coumadin clinic INR 5/21 = 1.5. Dose 3 mg x 1 then 2 mg daily. Coumadin dose per med hx: 2 mg daily x 1 mg on Sunday. Last dose 6/1 per med hx    10/08/2018   heparin level therapeutic at 0.47 on heparin drip at rate of 1200 units/hr Hg 7.5, PLTC WNL No bleeding reported For OR today- anterior approach L THR - sched 1445  Goal of Therapy:  Heparin level 0.3-0.7 units/ml Monitor platelets by anticoagulation protocol: Yes   Plan:  continue Heparin drip at1200 units/hr until turned off at 09 am per ortho for surgery tody Daily CBC & heparin level F/u anticoag plans post-op  Eudelia Bunch, Pharm.D [phone] 10/08/2018 8:26 AM

## 2018-10-08 NOTE — Progress Notes (Signed)
Received call from OR front desk stating that Dr. Lyla Glassing stated the patients surgery was going to be tomorrow 10/09/18 at 9am at [facility]. He also stated to resume her heparin drip and turn it off at 3am. No orders were placed due to Dr. Lyla Glassing being in the OR. Dr. Tawanna Solo placed orders to resume heparin, transfer to [facility] tele bed, place on telemetry, and a heart healthy diet. Bed placement called to make them aware that she may need an orthopedic floor. Patient informed of plan of care and pts daughter, [name], updated. No further questions asked by patient or daughter at this time. Will continue with plan of care.

## 2018-10-08 NOTE — Progress Notes (Signed)
Received call back from Dr. Doren Custard at [facility], who gave a verbal order to turn off the heparin drip at 9am and to give patient her morning meds with a sip of water. Will follow out orders and continue with plan of care.

## 2018-10-08 NOTE — Progress Notes (Signed)
    Subjective:  Patient reports pain as severe.  OR schedule at [facility] too full today.  Objective:   VITALS:   Vitals:   10/08/18 0438 10/08/18 0910 10/08/18 1154 10/08/18 1255  BP: (!) 116/45 139/61 (!) 145/60 (!) 130/54  Pulse: 64 73 66 83  Resp: 20 16  (!) 21  Temp: 98.9 F (37.2 C) 99 F (37.2 C)  98.7 F (37.1 C)  TempSrc: Oral Oral  Oral  SpO2: 96% 94%  97%  Weight:      Height:        NAD ABD soft Neurovascular intact Shortened and rotated  Lab Results  Component Value Date   WBC 8.0 10/08/2018   HGB 7.5 (L) 10/08/2018   HCT 28.3 (L) 10/08/2018   MCV 67.4 (L) 10/08/2018   PLT 275 10/08/2018   BMET    Component Value Date/Time   NA 134 (L) 10/08/2018 0600   NA 143 05/19/2018 1332   K 3.5 10/08/2018 0600   CL 94 (L) 10/08/2018 0600   CO2 28 10/08/2018 0600   GLUCOSE 131 (H) 10/08/2018 0600   BUN 27 (H) 10/08/2018 0600   BUN 15 05/19/2018 1332   CREATININE 1.52 (H) 10/08/2018 0600   CREATININE 1.34 (H) 03/19/2011 0908   CALCIUM 8.2 (L) 10/08/2018 0600   GFRNONAA 32 (L) 10/08/2018 0600   GFRAA 37 (L) 10/08/2018 0600    INR 1.5  Assessment/Plan: Day of Surgery   Active Problems:   Acute on chronic diastolic CHF (congestive heart failure) (HCC)   Preoperative cardiovascular examination   Anemia   Closed left hip fracture (HCC)   Fall   Hypokalemia   Somnolence   WBAT with walker DVT ppx: heparin gtt currently, SCDs, TEDS PO pain control PT/OT Dispo: needs 2 units PRBCS preop, transfer to [facility] for surgery tomorrow am, stop heparin gtt at 0300 tonight, NPO after MN   [employer] 10/08/2018, 2:36 PM   Rod Can, MD Cell: [phone] [facility] now [facility]  [facility] [address], [address], [place], [place] [address] Phone: [phone] [email or web address] Facebook  [employer]

## 2018-10-08 NOTE — Progress Notes (Addendum)
Shift event note: Notified by RN that pt lethargic but VSS and otherwise in NAD. Her attending Dr Tawanna Solo was paged prior to shift change and ordered Narcan 0.4 mg IV and an ABG. RN reports not much response w/ first IV Narcan. This NP ordered a seconded dose of Narcan 0.4 mg IV to be given stat. At the time of this NP's arrival to bedside pt was quite alert and conversing w/ staff and her daughters on Facetime on her tablet. She is oriented to person and place and currently w/o c/o. ABG obtained >7.13/66.9/97.4/32.9. BBS diminished but otherwise CTA. Pt MAE's and speech is clear, there is no facila droop or other obvious focal findings Assessment/Plan: 1. Somnolence: Suspect associated w/ dilaudid which she has rec'd x 3 today given CKD and remote h/o (R) nephrectomy. This is supported by complete resolution of somnolence s/p narcan. Non-focal exam. Daughters related to me that this often happens at home and she becomes very difficult to wake up. Will d/c dilaudid. If request PRN for pain may consider Tramadol. Will continue to monitor closely on Med-Surg unit.   Jeryl Columbia, NP-C [facility] Pager [phone]  CRITICAL CARE Performed by: Jeryl Columbia   Total critical care time: 60 minutes  Critical care time was exclusive of separately billable procedures and treating other patients.  Critical care was necessary to treat or prevent imminent or life-threatening deterioration.  Critical care was time spent personally by me on the following activities: development of treatment plan with patient and/or surrogate as well as nursing, discussions with consultants, evaluation of patient's response to treatment, examination of patient, obtaining history from patient or surrogate, ordering and performing treatments and interventions, ordering and review of laboratory studies, ordering and review of radiographic studies, pulse oximetry and re-evaluation of patient's condition.

## 2018-10-08 NOTE — Progress Notes (Addendum)
PROGRESS NOTE    [name]  XHB:[id number] DOB: 02/22/1934 DOA: 10/06/2018 PCP: Elby Beck, FNP   Brief Narrative: [name] a 85 y.o.femalewith a known history of chronic diastolic heart failure with pacemaker, atrial fibrillation on Coumadin, CKD, CAD, TIA,chronic anemia,recurrent urinary tract infection, remote history of renal cell cancer status post nephrectomy 2003 presents to the emergency department for evaluation of left hip pain status post mechanical fall.  She was found to have minimally displaced left femoral neck fracture.  Orthopedics consulted.  Since she had extensive cardiac history, cardiology consulted for clearance for surgery. Apparently cardiology has cleared for surgery .  For now, we will continue IV diuresis because of pleural effusion as per CT chest.She also has some peripheral edema. Currently she is hemodynamically stable.  Pain well controlled. We will start her on diet.  We will keep her n.p.o. after midnight.She will be transferred to [facility] today for orthopedic surgery tomorrow morning.  Assessment & Plan:   Active Problems:   Acute on chronic diastolic CHF (congestive heart failure) (HCC)   Preoperative cardiovascular examination   Anemia   Closed left hip fracture (HCC)   Fall   Hypokalemia   Somnolence  Left hip fracture following mechanical fall: She was found to have minimally displaced left femoral neck fracture.    Orthopedics following.  Planning for ORIF tomorrow. P T/OT after surgery.  Might need rehab.  Acute  respiratory failure with hypercarbia/respiratory depression/sleepiness/drowsiness: Presented with shortness of breath .d-dimer was elevated.  CT chest did not show any PE but showed bilateral moderate pleural effusion.  Started on aggressive diuresis . She became drowsy/sleepy yesterday afternoon most likely secondary to narcotics.  She was on Dilaudid for pain.  ABG showed hypercarbia.  Dilaudid has been  discontinued.  She responded very well to Narcan.  Respiratory status stable today.  Continue supplemental oxygen for now.  Acute on Chronic diastolic heart failure: Echocardiogram done on 10/06/2018 showed ejection fraction of 66 -65%,severe pulmonary hypertension.  Presented with volume overload.  CT chest showed pleural effusion.  She had lower extremity edema.  Started on IV diuresis.  Cardiology following.  Continue to monitor input/output.She was on torsemide at home.  Elevated d-dimer: CT Angio negative for PE, venous duplex did not show any lower extremity DVT.  Chronic A. fib: On Coumadin at home.  Changed to heparin drip after admission for progression of surgery.  Status post pacemaker.  History of CKD stage III: Baseline creatinine ranged from 1.2-1.6. Currently kidney function at baseline.  Continue to monitor.  History of chronic normocytic anemia: Most likely associated with chronic medical problems.  Continue to monitor.  Hemoglobin in the range of 7 today.  Being transfused units of PRBC as per orthopedics.  History of recent UTI: On Keflex from home.  Urine culture sent here did not show any growth.  Will discontinue antibiotics.  Hyperlipidemia: Continue Zetia, Crestor  History of recent weight loss/elevated d-dimer: Gives history of 20 pound weight loss over past 6 months with decreased appetite.  Elevated d-dimer on presentation.  She has remote history of renal cell carcinoma currently in remission, status post nephrectomy in 2003.  She should undergo surveillance for new or recurrent malignancy.  Can be done as an outpatient or inpatient after stabilization of current acute conditions.           DVT prophylaxis: Heparin drip Code Status: Full Family Communication: Called grand daughter on phone Disposition Plan: Transfer to [facility] today.   Consultants:  Orthopedics, cardiology  Procedures: None  Antimicrobials:  Anti-infectives (From admission, onward)   Start      Dose/Rate Route Frequency Ordered Stop   10/08/18 0600  ceFAZolin (ANCEF) IVPB 2g/100 mL premix     2 g 200 mL/hr over 30 Minutes Intravenous On call to O.R. 10/08/18 0248 10/09/18 0559   10/07/18 0300  cephALEXin (KEFLEX) capsule 500 mg     500 mg Oral Every 12 hours 10/07/18 0248        Subjective:  Patient seen and examined the bedside this morning.  Currently alert and oriented.  Drowsiness/sleepiness has resolved.  Denies any shortness of breath.  Pain well controlled.  Objective: Vitals:   10/08/18 0438 10/08/18 0910 10/08/18 1154 10/08/18 1255  BP: (!) 116/45 139/61 (!) 145/60 (!) 130/54  Pulse: 64 73 66 83  Resp: 20 16  (!) 21  Temp: 98.9 F (37.2 C) 99 F (37.2 C)  98.7 F (37.1 C)  TempSrc: Oral Oral  Oral  SpO2: 96% 94%  97%  Weight:      Height:        Intake/Output Summary (Last 24 hours) at 10/08/2018 1308 Last data filed at 10/08/2018 1154 Gross per 24 hour  Intake 896 ml  Output 625 ml  Net 271 ml   Filed Weights   10/06/18 2140 10/07/18 0323  Weight: 78 kg 76.9 kg    Examination:  General exam:Not in distress, elderly debilitated female HEENT:PERRL,Oral mucosa moist, Ear/Nose normal on gross exam Respiratory system: Bilateral decreased air entry on the bases Cardiovascular system: A. Fib, no JVD, murmurs, rubs, gallops or clicks. 1-2 + pedal edema.  Pacemaker Gastrointestinal system: Abdomen is nondistended, soft and nontender. No organomegaly or masses felt. Normal bowel sounds heard. Central nervous system: Alert and oriented. No focal neurological deficits. Extremities: 1-2+ pedal edema, no clubbing ,no cyanosis, distal peripheral pulses palpable. Skin: No rashes, lesions or ulcers,no icterus ,no pallor  Data Reviewed: I have personally reviewed following labs and imaging studies  CBC: Recent Labs  Lab 10/06/18 2220 10/07/18 0539 10/08/18 0600  WBC 7.4 5.6 8.0  NEUTROABS 6.6  --   --   HGB 8.0* 8.3* 7.5*  HCT 29.6* 30.5* 28.3*  MCV  65.9* 65.6* 67.4*  PLT 299 260 683   Basic Metabolic Panel: Recent Labs  Lab 10/06/18 2220 10/07/18 0539 10/08/18 0600  NA 137 134* 134*  K 2.8* 3.9 3.5  CL 96* 95* 94*  CO2 31 29 28   GLUCOSE 139* 154* 131*  BUN 17 16 27*  CREATININE 1.26* 1.20* 1.52*  CALCIUM 8.7* 7.9* 8.2*  MG 1.9  --   --    GFR: Estimated Creatinine Clearance: 29.9 mL/min (A) (by C-G formula based on SCr of 1.52 mg/dL (H)). Liver Function Tests: No results for input(s): AST, ALT, ALKPHOS, BILITOT, PROT, ALBUMIN in the last 168 hours. No results for input(s): LIPASE, AMYLASE in the last 168 hours. No results for input(s): AMMONIA in the last 168 hours. Coagulation Profile: Recent Labs  Lab 10/06/18 2220  INR 1.8*   Cardiac Enzymes: No results for input(s): CKTOTAL, CKMB, CKMBINDEX, TROPONINI in the last 168 hours. BNP (last 3 results) No results for input(s): PROBNP in the last 8760 hours. HbA1C: No results for input(s): HGBA1C in the last 72 hours. CBG: Recent Labs  Lab 10/08/18 0510  GLUCAP 115*   Lipid Profile: No results for input(s): CHOL, HDL, LDLCALC, TRIG, CHOLHDL, LDLDIRECT in the last 72 hours. Thyroid Function Tests: No results for  input(s): TSH, T4TOTAL, FREET4, T3FREE, THYROIDAB in the last 72 hours. Anemia Panel: No results for input(s): VITAMINB12, FOLATE, FERRITIN, TIBC, IRON, RETICCTPCT in the last 72 hours. Sepsis Labs: No results for input(s): PROCALCITON, LATICACIDVEN in the last 168 hours.  Recent Results (from the past 240 hour(s))  Urine Culture     Status: None   Collection Time: 10/05/18  3:57 PM  Result Value Ref Range Status   MICRO NUMBER: [id number]  Final   SPECIMEN QUALITY: Adequate  Final   Sample Source URINE  Final   STATUS: FINAL  Final   Result:   Final    Three or more organisms present, each greater than 10,000 CFU/mL. May represent normal flora contamination from external genitalia. No further testing is required.  SARS Coronavirus 2 (CEPHEID-  Performed in [facility] hospital lab), Hosp Order     Status: None   Collection Time: 10/06/18 10:16 PM  Result Value Ref Range Status   SARS Coronavirus 2 NEGATIVE NEGATIVE Final    Comment: (NOTE) If result is NEGATIVE SARS-CoV-2 target nucleic acids are NOT DETECTED. The SARS-CoV-2 RNA is generally detectable in upper and lower  respiratory specimens during the acute phase of infection. The lowest  concentration of SARS-CoV-2 viral copies this assay can detect is 250  copies / mL. A negative result does not preclude SARS-CoV-2 infection  and should not be used as the sole basis for treatment or other  patient management decisions.  A negative result may occur with  improper specimen collection / handling, submission of specimen other  than nasopharyngeal swab, presence of viral mutation(s) within the  areas targeted by this assay, and inadequate number of viral copies  (<250 copies / mL). A negative result must be combined with clinical  observations, patient history, and epidemiological information. If result is POSITIVE SARS-CoV-2 target nucleic acids are DETECTED. The SARS-CoV-2 RNA is generally detectable in upper and lower  respiratory specimens dur ing the acute phase of infection.  Positive  results are indicative of active infection with SARS-CoV-2.  Clinical  correlation with patient history and other diagnostic information is  necessary to determine patient infection status.  Positive results do  not rule out bacterial infection or co-infection with other viruses. If result is PRESUMPTIVE POSTIVE SARS-CoV-2 nucleic acids MAY BE PRESENT.   A presumptive positive result was obtained on the submitted specimen  and confirmed on repeat testing.  While 2019 novel coronavirus  (SARS-CoV-2) nucleic acids may be present in the submitted sample  additional confirmatory testing may be necessary for epidemiological  and / or clinical management purposes  to differentiate between    SARS-CoV-2 and other Sarbecovirus currently known to infect humans.  If clinically indicated additional testing with an alternate test  methodology [phone]) is advised. The SARS-CoV-2 RNA is generally  detectable in upper and lower respiratory sp ecimens during the acute  phase of infection. The expected result is Negative. Fact Sheet for Patients:  StrictlyIdeas.no Fact Sheet for Healthcare Providers: BankingDealers.co.za This test is not yet approved or cleared by the [place] FDA and has been authorized for detection and/or diagnosis of SARS-CoV-2 by FDA under an Emergency Use Authorization (EUA).  This EUA will remain in effect (meaning this test can be used) for the duration of the COVID-19 declaration under Section 564(b)(1) of the Act, 21 U.S.C. section 360bbb-3(b)(1), unless the authorization is terminated or revoked sooner. Performed at [facility], [place] [address], [place], [place] [address]   Urine  culture     Status: None   Collection Time: 10/07/18  1:20 AM  Result Value Ref Range Status   Specimen Description   Final    URINE, CATHETERIZED Performed at [facility], [place] [address], [place], [place] [address]    Special Requests   Final    NONE Performed at [facility], [place] [address], [place], [place] [address]    Culture   Final    NO GROWTH Performed at [facility], [place] [address], [place], [place] [address]    Report Status 10/07/2018 FINAL  Final  Surgical PCR screen     Status: None   Collection Time: 10/07/18  3:11 AM  Result Value Ref Range Status   MRSA, PCR NEGATIVE NEGATIVE Final   Staphylococcus aureus NEGATIVE NEGATIVE Final    Comment: (NOTE) The Xpert SA Assay (FDA approved for NASAL specimens in patients 51 years of age and older), is one component of a comprehensive surveillance program. It is not intended to diagnose  infection nor to guide or monitor treatment. Performed at [facility], [place] [address] [place], [place] [address]          Radiology Studies: Dg Chest 1 View  Result Date: 10/06/2018 CLINICAL DATA:  Hip fracture EXAM: CHEST  1 VIEW COMPARISON:  05/12/2018 FINDINGS: Unchanged position of left chest wall AICD leads. Mild cardiomegaly with hazy lower lobe predominant airspace opacities, possibly mild pulmonary edema. Limited assessment of left lung base due to positioning., but there is some degree of retrocardiac opacity. IMPRESSION: Mild cardiomegaly and mild pulmonary edema. Limited assessment of left lung base due to positioning, but some degree of retrocardiac opacity. Lateral view would be helpful for further evaluation if necessary. Electronically Signed   By: Ulyses Jarred M.D.   On: 10/06/2018 22:20   Ct Angio Chest Pe W Or Wo Contrast  Result Date: 10/07/2018 CLINICAL DATA:  Hypoxia and elevated D-dimer. EXAM: CT ANGIOGRAPHY CHEST WITH CONTRAST TECHNIQUE: Multidetector CT imaging of the chest was performed using the standard protocol during bolus administration of intravenous contrast. Multiplanar CT image reconstructions and MIPs were obtained to evaluate the vascular anatomy. CONTRAST:  35mL OMNIPAQUE IOHEXOL 350 MG/ML SOLN COMPARISON:  Chest x-ray from yesterday.  Chest CT 06/07/2010 FINDINGS: Cardiovascular: Cardiomegaly without pericardial effusion. Biventricular pacer from the left with 2 right ventricular ICD leads. Negative for pulmonary artery filling defect. Mediastinum/Nodes: Negative for mass or adenopathy Lungs/Pleura: Moderate layering pleural effusions on both sides. There is tracheobronchomalacia with generalized intermittently seen airway collapse. Dependent atelectasis. Ground-glass opacity asymmetric to the left lung with interlobular septal thickening. There are a few small subpleural nodules. Negative for lung mass. Upper Abdomen: Intrahepatic venous  reflux. Musculoskeletal: No acute or aggressive finding Review of the MIP images confirms the above findings. IMPRESSION: 1. Cardiomegaly with moderate bilateral layering pleural effusion. 2. Ground-glass opacity in the left more than right lung, likely edema. 3. Negative for pulmonary embolism. Electronically Signed   By: Monte Fantasia M.D.   On: 10/07/2018 04:38   Dg Hip Unilat With Pelvis 2-3 Views Left  Result Date: 10/06/2018 CLINICAL DATA:  Fall EXAM: DG HIP (WITH OR WITHOUT PELVIS) 2-3V LEFT COMPARISON:  None. FINDINGS: Minimally displaced fracture of the left femoral neck with foreshortening. Femoral head remains situated in the acetabulum. Normal appearance of right total hip arthroplasty. No pelvic ring fracture. IMPRESSION: Minimally displaced left femoral neck fracture with foreshortening. Electronically Signed   By: Lennette Bihari  [place] M.D.   On: 10/06/2018 22:18   Vas [place] Lower Extremity Venous (dvt)  Result Date: 10/07/2018  Lower Venous Study Indications: Swelling.  Limitations: Body habitus, poor ultrasound/tissue interface and poor patient compliance secondary to pain. Performing Technologist: Maudry Mayhew MHA, RDMS, RVT, RDCS  Examination Guidelines: A complete evaluation includes B-mode imaging, spectral Doppler, color Doppler, and power Doppler as needed of all accessible portions of each vessel. Bilateral testing is considered an integral part of a complete examination. Limited examinations for reoccurring indications may be performed as noted.  +---------+---------------+---------+-----------+----------+--------------+  RIGHT     Compressibility Phasicity Spontaneity Properties Summary         +---------+---------------+---------+-----------+----------+--------------+  CFV       Full            No        Yes                    pulsatile flow  +---------+---------------+---------+-----------+----------+--------------+  SFJ       Full                                                              +---------+---------------+---------+-----------+----------+--------------+  FV Prox   Full                                                             +---------+---------------+---------+-----------+----------+--------------+  FV Mid    Full                                                             +---------+---------------+---------+-----------+----------+--------------+  FV Distal Full                                                             +---------+---------------+---------+-----------+----------+--------------+  PFV       Full                                                             +---------+---------------+---------+-----------+----------+--------------+  POP       Full            No        Yes                    pulsatile flow  +---------+---------------+---------+-----------+----------+--------------+  PTV       Full                                                             +---------+---------------+---------+-----------+----------+--------------+  PERO      Full                                                             +---------+---------------+---------+-----------+----------+--------------+  +---------+---------------+---------+-----------+----------+-------------------+  LEFT      Compressibility Phasicity Spontaneity Properties Summary              +---------+---------------+---------+-----------+----------+-------------------+  CFV       Full            No        Yes                    pulsatile flow       +---------+---------------+---------+-----------+----------+-------------------+  SFJ       Full                                                                  +---------+---------------+---------+-----------+----------+-------------------+  FV Prox   Full                                                                  +---------+---------------+---------+-----------+----------+-------------------+  FV Mid    Full                                                                   +---------+---------------+---------+-----------+----------+-------------------+  FV Distal Full                                                                  +---------+---------------+---------+-----------+----------+-------------------+  PFV       Full                                                                  +---------+---------------+---------+-----------+----------+-------------------+  POP       Full            No        Yes                    pulsatile flow       +---------+---------------+---------+-----------+----------+-------------------+  PTV  Yes                    patent by color                                                                  Doppler- patient                                                                 unable to tolerate                                                               compression                                                                      maneuvers            +---------+---------------+---------+-----------+----------+-------------------+  PERO                                Yes                    patent by color                                                                  Doppler- patient                                                                 unable to tolerate                                                               compression  maneuvers            +---------+---------------+---------+-----------+----------+-------------------+  Summary: Right: There is no evidence of deep vein thrombosis in the lower extremity. No cystic structure found in the popliteal fossa. Left: There is no evidence of deep vein thrombosis in the lower extremity. However, portions of this examination were limited- see technologist comments above. No cystic structure found in the popliteal fossa.  *See table(s) above for measurements and observations.  Electronically signed by Curt Jews MD on 10/07/2018 at 2:37:31 PM.    Final         Scheduled Meds:  sodium chloride   Intravenous Once   cephALEXin  500 mg Oral Q12H   ezetimibe  10 mg Oral Daily   fluticasone  2 spray Each Nare Daily   furosemide  40 mg Intravenous BID   levothyroxine  25 mcg Oral Q0600   loratadine  10 mg Oral Daily   magnesium oxide  400 mg Oral Daily   povidone-iodine  2 application Topical Once   rosuvastatin  40 mg Oral Daily   Continuous Infusions:   ceFAZolin (ANCEF) IV     heparin       LOS: 1 day    Time spent: 35 mins.More than 50% of that time was spent in counseling and/or coordination of care.      Shelly Coss, MD [facility] Pager [phone]  If 7PM-7AM, please contact night-coverage [email or web address] Password [email or web address] 10/08/2018, 1:08 PM

## 2018-10-08 NOTE — Progress Notes (Signed)
Patient transported via [facility] to [facility]. Report given to Angelia with [facility]. All personal belongings including cell phone sent with patient. Attempted to call both numbers listed for [name], patients daughter, to inform her of transport but no answer.

## 2018-10-08 NOTE — Progress Notes (Signed)
PT Cancellation Note  Patient Details Name: MEARA WIECHMAN MRN: [id number] DOB: [birth year]   Cancelled Treatment:    Reason Eval/Treat Not Completed: Medical issues which prohibited therapy(surgery planned for later today)   Makeyla Govan,KATHrine E 10/08/2018, 8:53 AM Carmelia Bake, PT, DPT Acute Rehabilitation Services Office: [phone] Pager: [phone]

## 2018-10-08 NOTE — Progress Notes (Signed)
Report given to Iona Beard, Therapist, sports at [facility] on [address]. [facility] also called to set up transportation.

## 2018-10-08 NOTE — Progress Notes (Signed)
Progress Note  Patient Name: [name] Date of Encounter: 10/08/2018  Primary Cardiologist: Virl Axe, MD   Subjective   Patient found to be lethargic last night around 7:15pm with slow respiratory rate of 6-8 breaths per minute. Vitals otherwise stable. Two doses of Narcan were given and patient responded quickly. ABG was drawn and showed pH of 7.313, pCO2 of 66.9, pO2 of 97.4, and bicarb of 32.9.  Felt to be due to Dilaudid which has been used for pain control. Dilaudid was discontinued and Tramadol was started.  This morning patient reports feeling a little dizzy and nauseous. She states her breathing is fine a rest but she still notes some shortness of breath with minimal activity. Occasional palpitations with her permanent atrial fibrillation. No new chest pain.   Inpatient Medications    Scheduled Meds:  cephALEXin  500 mg Oral Q12H   ezetimibe  10 mg Oral Daily   fluticasone  2 spray Each Nare Daily   furosemide  40 mg Intravenous BID   levothyroxine  25 mcg Oral Q0600   loratadine  10 mg Oral Daily   magnesium oxide  400 mg Oral Daily   povidone-iodine  2 application Topical Once   rosuvastatin  40 mg Oral Daily   Continuous Infusions:   ceFAZolin (ANCEF) IV     PRN Meds: acetaminophen **OR** acetaminophen, albuterol, bisacodyl, ipratropium, magnesium citrate, naLOXone (NARCAN)  injection, ondansetron (ZOFRAN) IV, ondansetron **OR** ondansetron (ZOFRAN) IV, senna-docusate   Vital Signs    Vitals:   10/07/18 2143 10/08/18 0438 10/08/18 0438 10/08/18 0910  BP: (!) 141/56 (!) 116/45 (!) 116/45 139/61  Pulse: 70 64 64 73  Resp: 16 20 20 16   Temp: 98.5 F (36.9 C) 98.9 F (37.2 C) 98.9 F (37.2 C) 99 F (37.2 C)  TempSrc: Oral Oral Oral Oral  SpO2: 96% 96% 96% 94%  Weight:      Height:        Intake/Output Summary (Last 24 hours) at 10/08/2018 1118 Last data filed at 10/08/2018 0945 Gross per 24 hour  Intake 896 ml  Output 775 ml  Net 121  ml   Filed Weights   10/06/18 2140 10/07/18 0323  Weight: 78 kg 76.9 kg    Telemetry    Patient not on telemetry. - Personally Reviewed  ECG    No new ECG tracing today. - Personally Reviewed  Physical Exam   GEN: Elderly  female resting comfortably. Alert and in no acute distress.   Neck: Supple.  Cardiac: Distant heart sounds. Irregular rhythm with normal rate. II-III/VI murmur appreciated. No gallops or rubs.  Respiratory: No significant increased work of breathing. Clear to auscultation bilaterally. No wheezes, rhonchi, or rales. GI: Abdomen soft, non-distended, and non-tender. Bowel sounds present. Extremities: 1-2+ pitting edema of bilateral lower extremities. No deformity. Radial and distal pedal pulses 2+ and equal bilaterally. Skin: Warm and dry. Neuro:  No focal deficits. Psych: Normal affect. Responds appropriately.  Labs    Chemistry Recent Labs  Lab 10/06/18 2220 10/07/18 0539 10/08/18 0600  NA 137 134* 134*  K 2.8* 3.9 3.5  CL 96* 95* 94*  CO2 31 29 28   GLUCOSE 139* 154* 131*  BUN 17 16 27*  CREATININE 1.26* 1.20* 1.52*  CALCIUM 8.7* 7.9* 8.2*  GFRNONAA 40* 42* 32*  GFRAA 46* 49* 37*  ANIONGAP 10 10 12      Hematology Recent Labs  Lab 10/06/18 2220 10/07/18 0539 10/08/18 0600  WBC 7.4 5.6 8.0  RBC 4.49 4.65 4.20  HGB 8.0* 8.3* 7.5*  HCT 29.6* 30.5* 28.3*  MCV 65.9* 65.6* 67.4*  MCH 17.8* 17.8* 17.9*  MCHC 27.0* 27.2* 26.5*  RDW 20.9* 21.0* 20.9*  PLT 299 260 275    Cardiac EnzymesNo results for input(s): TROPONINI in the last 168 hours. No results for input(s): TROPIPOC in the last 168 hours.   BNP Recent Labs  Lab 10/06/18 2220  BNP 235.7*     DDimer  Recent Labs  Lab 10/07/18 0038  DDIMER 16.06*     Radiology    Dg Chest 1 View  Result Date: 10/06/2018 CLINICAL DATA:  Hip fracture EXAM: CHEST  1 VIEW COMPARISON:  05/12/2018 FINDINGS: Unchanged position of left chest wall AICD leads. Mild cardiomegaly with hazy lower  lobe predominant airspace opacities, possibly mild pulmonary edema. Limited assessment of left lung base due to positioning., but there is some degree of retrocardiac opacity. IMPRESSION: Mild cardiomegaly and mild pulmonary edema. Limited assessment of left lung base due to positioning, but some degree of retrocardiac opacity. Lateral view would be helpful for further evaluation if necessary. Electronically Signed   By: Ulyses Jarred M.D.   On: 10/06/2018 22:20   Ct Angio Chest Pe W Or Wo Contrast  Result Date: 10/07/2018 CLINICAL DATA:  Hypoxia and elevated D-dimer. EXAM: CT ANGIOGRAPHY CHEST WITH CONTRAST TECHNIQUE: Multidetector CT imaging of the chest was performed using the standard protocol during bolus administration of intravenous contrast. Multiplanar CT image reconstructions and MIPs were obtained to evaluate the vascular anatomy. CONTRAST:  22mL OMNIPAQUE IOHEXOL 350 MG/ML SOLN COMPARISON:  Chest x-ray from yesterday.  Chest CT 06/07/2010 FINDINGS: Cardiovascular: Cardiomegaly without pericardial effusion. Biventricular pacer from the left with 2 right ventricular ICD leads. Negative for pulmonary artery filling defect. Mediastinum/Nodes: Negative for mass or adenopathy Lungs/Pleura: Moderate layering pleural effusions on both sides. There is tracheobronchomalacia with generalized intermittently seen airway collapse. Dependent atelectasis. Ground-glass opacity asymmetric to the left lung with interlobular septal thickening. There are a few small subpleural nodules. Negative for lung mass. Upper Abdomen: Intrahepatic venous reflux. Musculoskeletal: No acute or aggressive finding Review of the MIP images confirms the above findings. IMPRESSION: 1. Cardiomegaly with moderate bilateral layering pleural effusion. 2. Ground-glass opacity in the left more than right lung, likely edema. 3. Negative for pulmonary embolism. Electronically Signed   By: Monte Fantasia M.D.   On: 10/07/2018 04:38   Dg Hip  Unilat With Pelvis 2-3 Views Left  Result Date: 10/06/2018 CLINICAL DATA:  Fall EXAM: DG HIP (WITH OR WITHOUT PELVIS) 2-3V LEFT COMPARISON:  None. FINDINGS: Minimally displaced fracture of the left femoral neck with foreshortening. Femoral head remains situated in the acetabulum. Normal appearance of right total hip arthroplasty. No pelvic ring fracture. IMPRESSION: Minimally displaced left femoral neck fracture with foreshortening. Electronically Signed   By: Ulyses Jarred M.D.   On: 10/06/2018 22:18   Vas [place] Lower Extremity Venous (dvt)  Result Date: 10/07/2018  Lower Venous Study Indications: Swelling.  Limitations: Body habitus, poor ultrasound/tissue interface and poor patient compliance secondary to pain. Performing Technologist: Maudry Mayhew MHA, RDMS, RVT, RDCS  Examination Guidelines: A complete evaluation includes B-mode imaging, spectral Doppler, color Doppler, and power Doppler as needed of all accessible portions of each vessel. Bilateral testing is considered an integral part of a complete examination. Limited examinations for reoccurring indications may be performed as noted.  +---------+---------------+---------+-----------+----------+--------------+  RIGHT     Compressibility Phasicity Spontaneity Properties Summary         +---------+---------------+---------+-----------+----------+--------------+  CFV       Full            No        Yes                    pulsatile flow  +---------+---------------+---------+-----------+----------+--------------+  SFJ       Full                                                             +---------+---------------+---------+-----------+----------+--------------+  FV Prox   Full                                                             +---------+---------------+---------+-----------+----------+--------------+  FV Mid    Full                                                              +---------+---------------+---------+-----------+----------+--------------+  FV Distal Full                                                             +---------+---------------+---------+-----------+----------+--------------+  PFV       Full                                                             +---------+---------------+---------+-----------+----------+--------------+  POP       Full            No        Yes                    pulsatile flow  +---------+---------------+---------+-----------+----------+--------------+  PTV       Full                                                             +---------+---------------+---------+-----------+----------+--------------+  PERO      Full                                                             +---------+---------------+---------+-----------+----------+--------------+  +---------+---------------+---------+-----------+----------+-------------------+  LEFT      Compressibility Phasicity Spontaneity Properties Summary              +---------+---------------+---------+-----------+----------+-------------------+  CFV       Full            No        Yes                    pulsatile flow       +---------+---------------+---------+-----------+----------+-------------------+  SFJ       Full                                                                  +---------+---------------+---------+-----------+----------+-------------------+  FV Prox   Full                                                                  +---------+---------------+---------+-----------+----------+-------------------+  FV Mid    Full                                                                  +---------+---------------+---------+-----------+----------+-------------------+  FV Distal Full                                                                  +---------+---------------+---------+-----------+----------+-------------------+  PFV       Full                                                                   +---------+---------------+---------+-----------+----------+-------------------+  POP       Full            No        Yes                    pulsatile flow       +---------+---------------+---------+-----------+----------+-------------------+  PTV                                 Yes                    patent by color  Doppler- patient                                                                 unable to tolerate                                                               compression                                                                      maneuvers            +---------+---------------+---------+-----------+----------+-------------------+  PERO                                Yes                    patent by color                                                                  Doppler- patient                                                                 unable to tolerate                                                               compression                                                                      maneuvers            +---------+---------------+---------+-----------+----------+-------------------+  Summary: Right: There is no evidence of deep vein thrombosis in the lower extremity. No cystic structure found in the popliteal fossa. Left: There is no evidence of deep vein thrombosis in the lower extremity. However, portions of this examination were limited- see technologist comments above. No cystic structure found  in the popliteal fossa.  *See table(s) above for measurements and observations. Electronically signed by Curt Jews MD on 10/07/2018 at 2:37:31 PM.    Final     Cardiac Studies   Echocardiogram 10/06/2018: Impressions: 1. The left ventricle has normal systolic function with an ejection fraction of 60-65%. The cavity size was normal. Left ventricular diastolic function could not be  evaluated due to nondiagnostic images. 2. The right ventricle has normal systolic function. The cavity was mildly enlarged. There is moderately increased right ventricular wall thickness. Right ventricular systolic pressure is severely elevated with an estimated pressure of 93.2 mmHg. 3. Left atrial size was mildly dilated. 4. The mitral valve is degenerative. Mild thickening of the mitral valve leaflet. 5. The tricuspid valve is not well visualized. Tricuspid valve regurgitation is mild-moderate. 6. The aortic valve was not well visualized. Mild thickening of the aortic valve. Aortic valve regurgitation is trivial by color flow Doppler. 7. The inferior vena cava was dilated in size with <50% respiratory variability.  Patient Profile   Ms. Biggers is a 85 y.o. female with a history of non-obstructive CAD by cardiac catheterization in 9417, chronic diastolic CHF s/p ICD, permanent atrial fibrillation having failed multiple antiarrhythmic drugs and on Coumadin, bilateral carotid stenosis, hypertension, hyperlipidemia, and renal cancer, who presented on 10/06/2018 for left hip pain after a mechanical fall and was found to have a left hip fracture. Cardiology was consulted for pre-operative evaluation at the request of Dr. Ara Kussmaul.  Assessment & Plan    Pre-Operative Evaluation - Patient presented for left hip pain following witnessed mechanical fall and was found to have a left femoral neck fracture. Ortho is planning for surgery. Cardiology consulted for pre-operative evaluation and to assist with heart failure management. - Patient felt to be at acceptable risk for surgery. No need for any additional cardiac work-up prior to surgery. Please see full pre-op assessment from yesterday. - Surgery scheduled for later this afternoon.   Acute on Chronic Diastolic CHF - BNP elevated in the 200's and chest x-ray suggestive of CHF.  - Echo showed LVEF of 60-65%. - Patient on Torsemide 40mg  daily  and Metolazone 5mg  once weekly. - Patient got two doses of IV Lasix 40mg  yesterday. Documented output of 969mL in the past 24 hours and 1.75 L since admission. Overall net positive 484.4 mL.  - Continue IV Lasix 40mg  daily for now.   - Continue to monitor volume status and renal function closely.  Permanent Atrial Fibrillation - EKG shows rate controlled atrial fibrillation. - Patient has failed multiple anti-arrhythmic medications.  - Previously on Coreg but this was discontinued due to concern for low BP. - On Couamdin at home. This was held on admission and patient was started on IV Heparin.  Pulmonary Hypertension - Echo yesterday showed severely elevated RVSP 93.2 mmHg. Suspect this is contributing to patients dyspnea on exertion. - D-dimer was significant elevated but chest CTA negative for PE.  History of Renal Cancer s/p Nephrectomy / CKD Stage III - Creatinine increased from 1.2 yesterday to 1.52 today after diuresis. Baseline appears to be around 1.5. - Will recheck BMET in the morning.  Elevated D-Dimer - D-dimer significant elevated at 16.06. Chest CTA negative for PE and preliminary read on lower extremity dopplers negative for DVT.  **Of note, patient found to be lethargic last night around 7:15pm with slow respiratory rate of 6-8 breaths per minute. Vitals otherwise stable. Two doses of Narcan were given and patient responded quickly. Patient alert  and oriented following Narcan but reported she "didn't feel good." ABG was drawn and showed pH of 7.313, pCO2 of 66.9, pO2 of 97.4, and bicarb of 32.9.  Felt to be due to Dilaudid which has been used for pain control. Dilaudid was discontinued and Tramadol was started. Patient continues to report some feeling of unwell this morning with dizziness and nausea. Vital stable.  Otherwise, per primary team. - Left femoral neck fracture - Hypokalemia - Anemia - History of frequent UTIs - Hyperlipidemia  For questions or updates,  please contact [place] Please consult [email or web address] for contact info under Cardiology/STEMI.      SignedDarreld Mclean, PA-C  10/08/2018, 11:18 AM   Pager: [phone]

## 2018-10-08 NOTE — Progress Notes (Signed)
Patients daughter, [name], called this RN back and was updated that pt had been transferred to [facility].

## 2018-10-09 ENCOUNTER — Encounter (HOSPITAL_COMMUNITY): Payer: Self-pay | Admitting: *Deleted

## 2018-10-09 ENCOUNTER — Encounter (HOSPITAL_COMMUNITY)
Admission: EM | Disposition: A | Discharge status: Skilled Nursing Facility | Payer: Self-pay | Source: Home / Self Care | Attending: Internal Medicine

## 2018-10-09 ENCOUNTER — Inpatient Hospital Stay (HOSPITAL_COMMUNITY): Payer: PPO | Admitting: Anesthesiology

## 2018-10-09 ENCOUNTER — Inpatient Hospital Stay (HOSPITAL_COMMUNITY): Payer: PPO

## 2018-10-09 ENCOUNTER — Inpatient Hospital Stay: Payer: PPO | Admitting: Oncology

## 2018-10-09 DIAGNOSIS — S72002A Fracture of unspecified part of neck of left femur, initial encounter for closed fracture: Secondary | ICD-10-CM

## 2018-10-09 HISTORY — DX: Fracture of unspecified part of neck of left femur, initial encounter for closed fracture: S72.002A

## 2018-10-09 HISTORY — PX: TOTAL HIP ARTHROPLASTY: SHX124

## 2018-10-09 LAB — POCT I-STAT 4, (NA,K, GLUC, HGB,HCT)
Glucose, Bld: 105 mg/dL — ABNORMAL HIGH (ref 70–99)
Glucose, Bld: 105 mg/dL — ABNORMAL HIGH (ref 70–99)
HCT: 34 % — ABNORMAL LOW (ref 36.0–46.0)
HCT: 33 % — ABNORMAL LOW (ref 36.0–46.0)
Hemoglobin: 11.6 g/dL — ABNORMAL LOW (ref 12.0–15.0)
Hemoglobin: 11.2 g/dL — ABNORMAL LOW (ref 12.0–15.0)
Potassium: 3.5 mmol/L (ref 3.5–5.1)
Potassium: 3.8 mmol/L (ref 3.5–5.1)
Sodium: 135 mmol/L (ref 135–145)
Sodium: 135 mmol/L (ref 135–145)

## 2018-10-09 LAB — PREPARE RBC (CROSSMATCH)

## 2018-10-09 LAB — BASIC METABOLIC PANEL
Anion gap: 13 (ref 5–15)
BUN: 24 mg/dL — ABNORMAL HIGH (ref 8–23)
CO2: 29 mmol/L (ref 22–32)
Calcium: 8.6 mg/dL — ABNORMAL LOW (ref 8.9–10.3)
Chloride: 95 mmol/L — ABNORMAL LOW (ref 98–111)
Creatinine, Ser: 1.48 mg/dL — ABNORMAL HIGH (ref 0.44–1.00)
GFR calc Af Amer: 38 mL/min — ABNORMAL LOW (ref >60)
GFR calc non Af Amer: 33 mL/min — ABNORMAL LOW (ref >60)
Glucose, Bld: 160 mg/dL — ABNORMAL HIGH (ref 70–99)
Potassium: 3.7 mmol/L (ref 3.5–5.1)
Sodium: 137 mmol/L (ref 135–145)

## 2018-10-09 SURGERY — ARTHROPLASTY, HIP, TOTAL, ANTERIOR APPROACH
Anesthesia: General | Laterality: Left

## 2018-10-09 MED ORDER — ONDANSETRON HCL 4 MG PO TABS: 4.0000 mg | ORAL_TABLET | Freq: Four times a day (QID) | ORAL | Status: DC | PRN

## 2018-10-09 MED ORDER — OXYCODONE HCL 5 MG PO TABS: 5.0000 mg | ORAL_TABLET | Freq: Once | ORAL | Status: DC | PRN

## 2018-10-09 MED ORDER — CEFAZOLIN SODIUM-DEXTROSE 2-4 GM/100ML-% IV SOLN
INTRAVENOUS | Status: AC
  Filled 2018-10-09: qty 100

## 2018-10-09 MED ORDER — ONDANSETRON HCL 4 MG/2ML IJ SOLN
INTRAMUSCULAR | Status: AC
  Filled 2018-10-09: qty 2

## 2018-10-09 MED ORDER — TRANEXAMIC ACID-NACL 1000-0.7 MG/100ML-% IV SOLN
INTRAVENOUS | Status: AC
  Filled 2018-10-09: qty 100

## 2018-10-09 MED ORDER — SUGAMMADEX SODIUM 200 MG/2ML IV SOLN
INTRAVENOUS | Status: DC | PRN
  Administered 2018-10-09: 150 mg via INTRAVENOUS

## 2018-10-09 MED ORDER — 0.9 % SODIUM CHLORIDE (POUR BTL) OPTIME
TOPICAL | Status: DC | PRN
  Administered 2018-10-09: 1000 mL

## 2018-10-09 MED ORDER — METOCLOPRAMIDE HCL 5 MG/ML IJ SOLN: 5.0000 mg | Freq: Three times a day (TID) | INTRAMUSCULAR | Status: DC | PRN

## 2018-10-09 MED ORDER — LACTATED RINGERS IV SOLN
INTRAVENOUS | Status: DC
  Administered 2018-10-09: 10:00:00 via INTRAVENOUS

## 2018-10-09 MED ORDER — KETOROLAC TROMETHAMINE 30 MG/ML IJ SOLN
INTRAMUSCULAR | Status: DC | PRN
  Administered 2018-10-09: 30 mg via INTRAVENOUS

## 2018-10-09 MED ORDER — PROMETHAZINE HCL 25 MG/ML IJ SOLN: 6.2500 mg | INTRAMUSCULAR | Status: DC | PRN

## 2018-10-09 MED ORDER — MENTHOL 3 MG MT LOZG: 1.0000 | LOZENGE | OROMUCOSAL | Status: DC | PRN

## 2018-10-09 MED ORDER — HYDROMORPHONE HCL 1 MG/ML IJ SOLN: 0.2500 mg | INTRAMUSCULAR | Status: DC | PRN

## 2018-10-09 MED ORDER — BUPIVACAINE-EPINEPHRINE (PF) 0.5% -1:200000 IJ SOLN
INTRAMUSCULAR | Status: DC | PRN
  Administered 2018-10-09: 30 mL via PERINEURAL

## 2018-10-09 MED ORDER — ALBUMIN HUMAN 5 % IV SOLN
INTRAVENOUS | Status: DC | PRN
  Administered 2018-10-09: 12:00:00 via INTRAVENOUS

## 2018-10-09 MED ORDER — LIDOCAINE 2% (20 MG/ML) 5 ML SYRINGE
INTRAMUSCULAR | Status: AC
  Filled 2018-10-09: qty 5

## 2018-10-09 MED ORDER — DEXAMETHASONE SODIUM PHOSPHATE 10 MG/ML IJ SOLN
INTRAMUSCULAR | Status: AC
  Filled 2018-10-09: qty 1

## 2018-10-09 MED ORDER — CEFAZOLIN SODIUM-DEXTROSE 2-4 GM/100ML-% IV SOLN
2.0000 g | Freq: Four times a day (QID) | INTRAVENOUS | Status: AC
  Administered 2018-10-09 (×2): 2 g via INTRAVENOUS
  Filled 2018-10-09 (×2): qty 100

## 2018-10-09 MED ORDER — PROPOFOL 10 MG/ML IV BOLUS
INTRAVENOUS | Status: DC | PRN
  Administered 2018-10-09: 100 mg via INTRAVENOUS

## 2018-10-09 MED ORDER — SUCCINYLCHOLINE CHLORIDE 20 MG/ML IJ SOLN
INTRAMUSCULAR | Status: DC | PRN
  Administered 2018-10-09: 120 mg via INTRAVENOUS

## 2018-10-09 MED ORDER — TRANEXAMIC ACID-NACL 1000-0.7 MG/100ML-% IV SOLN
1000.0000 mg | INTRAVENOUS | Status: AC
  Administered 2018-10-09: 1000 mg via INTRAVENOUS
  Filled 2018-10-09: qty 100

## 2018-10-09 MED ORDER — LIDOCAINE 2% (20 MG/ML) 5 ML SYRINGE
INTRAMUSCULAR | Status: DC | PRN
  Administered 2018-10-09: 60 mg via INTRAVENOUS

## 2018-10-09 MED ORDER — OXYCODONE HCL 5 MG/5ML PO SOLN: 5.0000 mg | Freq: Once | ORAL | Status: DC | PRN

## 2018-10-09 MED ORDER — ONDANSETRON HCL 4 MG/2ML IJ SOLN: 4.0000 mg | Freq: Four times a day (QID) | INTRAMUSCULAR | Status: DC | PRN

## 2018-10-09 MED ORDER — SODIUM CHLORIDE 0.9 % IR SOLN
Status: DC | PRN
  Administered 2018-10-09: 3000 mL

## 2018-10-09 MED ORDER — SODIUM CHLORIDE 0.9% IV SOLUTION: Freq: Once | INTRAVENOUS | Status: DC

## 2018-10-09 MED ORDER — FENTANYL CITRATE (PF) 100 MCG/2ML IJ SOLN
INTRAMUSCULAR | Status: DC | PRN
  Administered 2018-10-09 (×2): 50 ug via INTRAVENOUS

## 2018-10-09 MED ORDER — KETOROLAC TROMETHAMINE 30 MG/ML IJ SOLN
INTRAMUSCULAR | Status: AC
  Filled 2018-10-09: qty 1

## 2018-10-09 MED ORDER — SODIUM CHLORIDE 0.9 % IV SOLN
INTRAVENOUS | Status: AC | PRN
  Administered 2018-10-09: 1000 mL via INTRAMUSCULAR

## 2018-10-09 MED ORDER — FENTANYL CITRATE (PF) 250 MCG/5ML IJ SOLN
INTRAMUSCULAR | Status: AC
  Filled 2018-10-09: qty 5

## 2018-10-09 MED ORDER — WARFARIN - PHARMACIST DOSING INPATIENT: Freq: Every day | Status: DC

## 2018-10-09 MED ORDER — PROPOFOL 10 MG/ML IV BOLUS
INTRAVENOUS | Status: AC
  Filled 2018-10-09: qty 20

## 2018-10-09 MED ORDER — CEFAZOLIN SODIUM-DEXTROSE 2-3 GM-%(50ML) IV SOLR
INTRAVENOUS | Status: DC | PRN
  Administered 2018-10-09: 2 g via INTRAVENOUS

## 2018-10-09 MED ORDER — ROCURONIUM BROMIDE 10 MG/ML (PF) SYRINGE
PREFILLED_SYRINGE | INTRAVENOUS | Status: DC | PRN
  Administered 2018-10-09: 30 mg via INTRAVENOUS

## 2018-10-09 MED ORDER — ROCURONIUM BROMIDE 10 MG/ML (PF) SYRINGE
PREFILLED_SYRINGE | INTRAVENOUS | Status: AC
  Filled 2018-10-09: qty 10

## 2018-10-09 MED ORDER — BUPIVACAINE-EPINEPHRINE (PF) 0.5% -1:200000 IJ SOLN
INTRAMUSCULAR | Status: AC
  Filled 2018-10-09: qty 30

## 2018-10-09 MED ORDER — METOCLOPRAMIDE HCL 5 MG PO TABS: 5.0000 mg | ORAL_TABLET | Freq: Three times a day (TID) | ORAL | Status: DC | PRN

## 2018-10-09 MED ORDER — WARFARIN SODIUM 3 MG PO TABS
3.0000 mg | ORAL_TABLET | Freq: Once | ORAL | Status: AC
  Administered 2018-10-09: 3 mg via ORAL
  Filled 2018-10-09: qty 1

## 2018-10-09 MED ORDER — ONDANSETRON HCL 4 MG/2ML IJ SOLN
INTRAMUSCULAR | Status: DC | PRN
  Administered 2018-10-09: 4 mg via INTRAVENOUS

## 2018-10-09 MED ORDER — DOCUSATE SODIUM 100 MG PO CAPS
100.0000 mg | ORAL_CAPSULE | Freq: Two times a day (BID) | ORAL | Status: DC
  Administered 2018-10-09 – 2018-10-12 (×5): 100 mg via ORAL
  Filled 2018-10-09 (×5): qty 1

## 2018-10-09 MED ORDER — PHENOL 1.4 % MT LIQD: 1.0000 | OROMUCOSAL | Status: DC | PRN

## 2018-10-09 MED ORDER — SODIUM CHLORIDE (PF) 0.9 % IJ SOLN
INTRAMUSCULAR | Status: DC | PRN
  Administered 2018-10-09: 30 mL via INTRAVENOUS

## 2018-10-09 MED ORDER — SUCCINYLCHOLINE CHLORIDE 200 MG/10ML IV SOSY
PREFILLED_SYRINGE | INTRAVENOUS | Status: AC
  Filled 2018-10-09: qty 10

## 2018-10-09 MED ORDER — DEXAMETHASONE SODIUM PHOSPHATE 4 MG/ML IJ SOLN
INTRAMUSCULAR | Status: DC | PRN
  Administered 2018-10-09: 4 mg via INTRAVENOUS

## 2018-10-09 MED ORDER — HEPARIN (PORCINE) 25000 UT/250ML-% IV SOLN
1300.0000 [IU]/h | INTRAVENOUS | Status: DC
  Administered 2018-10-10: 1200 [IU]/h via INTRAVENOUS
  Filled 2018-10-09 (×2): qty 250

## 2018-10-09 SURGICAL SUPPLY — 64 items
ALCOHOL ISOPROPYL (RUBBING) (MISCELLANEOUS) ×2 IMPLANT
BALL HIP ARTICU EZE 36 8.5 (Hips) IMPLANT
BLADE CLIPPER SURG (BLADE) IMPLANT
CHLORAPREP W/TINT 26ML (MISCELLANEOUS) ×2 IMPLANT
COVER SURGICAL LIGHT HANDLE (MISCELLANEOUS) ×2 IMPLANT
COVER WAND RF STERILE (DRAPES) ×2 IMPLANT
DECANTER SPIKE VIAL GLASS SM (MISCELLANEOUS) ×1 IMPLANT
DERMABOND ADVANCED (GAUZE/BANDAGES/DRESSINGS) ×1
DERMABOND ADVANCED .7 DNX12 (GAUZE/BANDAGES/DRESSINGS) ×2 IMPLANT
DRAPE C-ARM 42X72 X-RAY (DRAPES) ×2 IMPLANT
DRAPE STERI IOBAN 125X83 (DRAPES) ×2 IMPLANT
DRAPE U-SHAPE 47X51 STRL (DRAPES) ×6 IMPLANT
DRSG AQUACEL AG ADV 3.5X10 (GAUZE/BANDAGES/DRESSINGS) ×2 IMPLANT
ELECT BLADE 4.0 EZ CLEAN MEGAD (MISCELLANEOUS) ×2
ELECT PENCIL ROCKER SW 15FT (MISCELLANEOUS) ×2 IMPLANT
ELECT REM PT RETURN 9FT ADLT (ELECTROSURGICAL) ×2
ELECTRODE BLDE 4.0 EZ CLN MEGD (MISCELLANEOUS) ×1 IMPLANT
ELECTRODE REM PT RTRN 9FT ADLT (ELECTROSURGICAL) ×1 IMPLANT
EVACUATOR 1/8 PVC DRAIN (DRAIN) IMPLANT
GLOVE BIO SURGEON STRL SZ7.5 (GLOVE) ×1 IMPLANT
GLOVE BIO SURGEON STRL SZ8.5 (GLOVE) ×4 IMPLANT
GLOVE BIOGEL PI IND STRL 8.5 (GLOVE) ×1 IMPLANT
GLOVE BIOGEL PI INDICATOR 8.5 (GLOVE) ×1
GOWN STRL REUS W/ TWL LRG LVL3 (GOWN DISPOSABLE) ×2 IMPLANT
GOWN STRL REUS W/TWL 2XL LVL3 (GOWN DISPOSABLE) ×2 IMPLANT
GOWN STRL REUS W/TWL LRG LVL3 (GOWN DISPOSABLE) ×2
HANDPIECE INTERPULSE COAX TIP (DISPOSABLE) ×1
HIP BALL ARTICU EZE 36 8.5 (Hips) ×2 IMPLANT
HOOD PEEL AWAY FACE SHEILD DIS (HOOD) ×2 IMPLANT
HOOD PEEL AWAY FLYTE STAYCOOL (MISCELLANEOUS) ×3 IMPLANT
KIT BASIN OR (CUSTOM PROCEDURE TRAY) ×2 IMPLANT
KIT TURNOVER KIT B (KITS) ×2 IMPLANT
LINER ACETAB NEUTRAL 36ID 520D (Liner) ×1 IMPLANT
MANIFOLD NEPTUNE II (INSTRUMENTS) ×2 IMPLANT
MARKER SKIN DUAL TIP RULER LAB (MISCELLANEOUS) ×4 IMPLANT
NDL SPNL 18GX3.5 QUINCKE PK (NEEDLE) ×1 IMPLANT
NEEDLE SPNL 18GX3.5 QUINCKE PK (NEEDLE) ×2 IMPLANT
NS IRRIG 1000ML POUR BTL (IV SOLUTION) ×2 IMPLANT
PACK TOTAL JOINT (CUSTOM PROCEDURE TRAY) ×2 IMPLANT
PACK UNIVERSAL I (CUSTOM PROCEDURE TRAY) ×2 IMPLANT
PAD ARMBOARD 7.5X6 YLW CONV (MISCELLANEOUS) ×4 IMPLANT
PIN SECTOR W/GRIP ACE CUP 52MM (Hips) ×1 IMPLANT
SAW OSC TIP CART 19.5X105X1.3 (SAW) ×2 IMPLANT
SEALER BIPOLAR AQUA 6.0 (INSTRUMENTS) IMPLANT
SET HNDPC FAN SPRY TIP SCT (DISPOSABLE) ×1 IMPLANT
SOL PREP POV-IOD 4OZ 10% (MISCELLANEOUS) ×2 IMPLANT
STEM TRI LOC BPS GRIPTON SZ 5 (Hips) IMPLANT
SUT ETHIBOND NAB CT1 #1 30IN (SUTURE) ×4 IMPLANT
SUT MNCRL AB 3-0 PS2 18 (SUTURE) ×3 IMPLANT
SUT MON AB 2-0 CT1 36 (SUTURE) ×4 IMPLANT
SUT VIC AB 1 CT1 27 (SUTURE) ×2
SUT VIC AB 1 CT1 27XBRD ANBCTR (SUTURE) ×1 IMPLANT
SUT VIC AB 2-0 CT1 27 (SUTURE) ×2
SUT VIC AB 2-0 CT1 TAPERPNT 27 (SUTURE) ×1 IMPLANT
SUT VLOC 180 0 24IN GS25 (SUTURE) ×2 IMPLANT
SYR 50ML LL SCALE MARK (SYRINGE) ×2 IMPLANT
TOWEL OR 17X24 6PK STRL BLUE (TOWEL DISPOSABLE) ×2 IMPLANT
TOWEL OR 17X26 10 PK STRL BLUE (TOWEL DISPOSABLE) ×2 IMPLANT
TRAY CATH 16FR W/PLASTIC CATH (SET/KITS/TRAYS/PACK) IMPLANT
TRAY FOLEY CATH SILVER 16FR (SET/KITS/TRAYS/PACK) IMPLANT
TRI LOC BPS W GRIPTON SZ 5 (Hips) ×2 IMPLANT
TUBING BULK SUCTION (MISCELLANEOUS) ×2 IMPLANT
WATER STERILE IRR 1000ML POUR (IV SOLUTION) ×6 IMPLANT
YANKAUER SUCT BULB TIP NO VENT (SUCTIONS) ×2 IMPLANT

## 2018-10-09 NOTE — Progress Notes (Signed)
Patient ID: MINDEL FRISCIA, female   DOB: Nov 03, 1933, 85 y.o.   MRN: [id number]  PROGRESS NOTE    LIYAH HIGHAM  NOM:[id number] DOB: [birth year] DOA: 10/06/2018 PCP: Elby Beck, FNP   Brief Narrative:   85 year old female with history of chronic diastolic heart failure with pacemaker, atrial fibrillation on Coumadin, CKD, CAD, TIA, chronic anemia, recurrent urinary tract infection, remote history of renal cell cancer status post nephrectomy presented on 10/06/2018 with left hip pain status post mechanical fall.  She was found to have minimally displaced left femoral neck fracture.  Orthopedics was consulted.  Cardiology was also consulted given extensive cardiac history.  She was transferred to [facility] on 10/08/2018 for orthopedic intervention.  Assessment & Plan:   Active Problems:   Acute on chronic diastolic CHF (congestive heart failure) (HCC)   Preoperative cardiovascular examination   Anemia   Closed left hip fracture (HCC)   Fall   Hypokalemia   Somnolence  Left femoral neck fracture following mechanical fall -Orthopedics following.  Status post left total hip arthroplasty today.  Follow further recommendations from orthopedics. -Fall precautions -PT/OT after surgery.  Might need rehab -Pain management  Acute respiratory failure with hypercarbia along with sleepiness and drowsiness -CT chest did not show any PE but showed bilateral moderate pleural effusion.  She was treated with aggressive diuresis -Subsequently patient became excessively drowsy/sleepy on the afternoon of 10/07/2018 as she was on Dilaudid for pain.  ABG showed hypercarbia.  Dilaudid was discontinued.  She responded very well to Narcan.  Subsequently respiratory status has improved.  Currently on 2 L oxygen supplementation via nasal cannula. -Monitor mental status.  Acute on chronic diastolic heart failure -Echo on 10/06/2018 showed EF of 60 to 65% with severe pulmonary hypertension -Strict  input and output.  Daily weights.  Fluid restriction. -Cardiology following.  Currently on Lasix 40 mg IV twice daily.  Chronic atrial fibrillation with history of pacemaker -On Coumadin at home.  Has been on heparin since admission.  Hold heparin for surgery. -Currently rate controlled.  Chronic disease history--monitor creatinine.  Baseline creatinine ranged from 1.2-1.6  Anemia of chronic disease from chronic kidney disease -Baseline hemoglobin is around 8.  Hemoglobin was 7.5 yesterday.  Status post 2 units packed red cell transfusion per orthopedics on 10/08/2018.  Monitor H&H.  History of recent UTI -On Keflex at home.  Urine culture here did not show any growth.  Will DC antibiotics.  Hyperlipidemia--continue Zetia, Crestor  History of recent weight loss -Apparently lost around 20 pounds over the last 6 months with decreased appetite.  Patient has history of renal cell cancer currently and depression, status post nephrectomy in 2003.  She will need outpatient follow-up with PCP regarding the same to rule out any new or recurrent malignancy.  DVT prophylaxis: Heparin drip Code Status: Full Family Communication: None at bedside Disposition Plan: Depends on clinical outcome  Consultants: Orthopedic/cardiology  Procedures:  Echo  IMPRESSIONS    1. The left ventricle has normal systolic function with an ejection fraction of 60-65%. The cavity size was normal. Left ventricular diastolic function could not be evaluated due to nondiagnostic images.  2. The right ventricle has normal systolic function. The cavity was mildly enlarged. There is moderately increased right ventricular wall thickness. Right ventricular systolic pressure is severely elevated with an estimated pressure of 93.2 mmHg.  3. Left atrial size was mildly dilated.  4. The mitral valve is degenerative. Mild thickening of the mitral valve leaflet.  5. The tricuspid valve is not well visualized. Tricuspid valve  regurgitation is mild-moderate.  6. The aortic valve was not well visualized. Mild thickening of the aortic valve. Aortic valve regurgitation is trivial by color flow Doppler.  7. The inferior vena cava was dilated in size with <50% respiratory variability.  Antimicrobials:  Anti-infectives (From admission, onward)   Start     Dose/Rate Route Frequency Ordered Stop   10/09/18 0855  ceFAZolin (ANCEF) 2-4 GM/100ML-% IVPB    Note to Pharmacy:  Nyoka Cowden   : cabinet override      10/09/18 0855 10/09/18 2059   10/08/18 0600  ceFAZolin (ANCEF) IVPB 2g/100 mL premix     2 g 200 mL/hr over 30 Minutes Intravenous On call to O.R. 10/08/18 0248 10/09/18 0559   10/07/18 0300  [MAR Hold]  cephALEXin (KEFLEX) capsule 500 mg     (MAR Hold since Fri 10/09/2018 at 0833. Reason: Transfer to a Procedural area.)   500 mg Oral Every 12 hours 10/07/18 0248 10/09/18 1453       Subjective: Patient seen and examined at bedside in PACU.  She is sleepy, wakes up slightly, does not answer most questions.  Objective: Vitals:   10/09/18 0049 10/09/18 0330 10/09/18 0812 10/09/18 0905  BP: (!) 138/52 (!) 143/60 137/69   Pulse: 87 75 79   Resp: 16 16 18    Temp: 98.8 F (37.1 C) 98.7 F (37.1 C) 98.5 F (36.9 C)   TempSrc: Oral Oral Oral   SpO2: 94% 97% 97%   Weight:    76.9 kg  Height:    5\' 6"  (1.676 m)    Intake/Output Summary (Last 24 hours) at 10/09/2018 1034 Last data filed at 10/09/2018 0343 Gross per 24 hour  Intake 569.57 ml  Output 950 ml  Net -380.43 ml   Filed Weights   10/06/18 2140 10/07/18 0323 10/09/18 0905  Weight: 78 kg 76.9 kg 76.9 kg    Examination:  General exam: Appears sleepy.  No distress.   Respiratory system: Bilateral decreased breath sounds at bases, scattered crackles Cardiovascular system: S1 & S2 heard, Rate controlled Gastrointestinal system: Abdomen is nondistended, soft and nontender. Normal bowel sounds heard. Extremities: No cyanosis, clubbing; trace edema    Data Reviewed: I have personally reviewed following labs and imaging studies  CBC: Recent Labs  Lab 10/06/18 2220 10/07/18 0539 10/08/18 0600 10/09/18 0850  WBC 7.4 5.6 8.0  --   NEUTROABS 6.6  --   --   --   HGB 8.0* 8.3* 7.5* 11.6*  HCT 29.6* 30.5* 28.3* 34.0*  MCV 65.9* 65.6* 67.4*  --   PLT 299 260 275  --    Basic Metabolic Panel: Recent Labs  Lab 10/06/18 2220 10/07/18 0539 10/08/18 0600 10/09/18 0850  NA 137 134* 134* 135  K 2.8* 3.9 3.5 3.5  CL 96* 95* 94*  --   CO2 31 29 28   --   GLUCOSE 139* 154* 131* 105*  BUN 17 16 27*  --   CREATININE 1.26* 1.20* 1.52*  --   CALCIUM 8.7* 7.9* 8.2*  --   MG 1.9  --   --   --    GFR: Estimated Creatinine Clearance: 29.9 mL/min (A) (by C-G formula based on SCr of 1.52 mg/dL (H)). Liver Function Tests: No results for input(s): AST, ALT, ALKPHOS, BILITOT, PROT, ALBUMIN in the last 168 hours. No results for input(s): LIPASE, AMYLASE in the last 168 hours. No results for input(s): AMMONIA in  the last 168 hours. Coagulation Profile: Recent Labs  Lab 10/06/18 2220 10/08/18 1259  INR 1.8* 1.5*   Cardiac Enzymes: No results for input(s): CKTOTAL, CKMB, CKMBINDEX, TROPONINI in the last 168 hours. BNP (last 3 results) No results for input(s): PROBNP in the last 8760 hours. HbA1C: No results for input(s): HGBA1C in the last 72 hours. CBG: Recent Labs  Lab 10/08/18 0510  GLUCAP 115*   Lipid Profile: No results for input(s): CHOL, HDL, LDLCALC, TRIG, CHOLHDL, LDLDIRECT in the last 72 hours. Thyroid Function Tests: No results for input(s): TSH, T4TOTAL, FREET4, T3FREE, THYROIDAB in the last 72 hours. Anemia Panel: No results for input(s): VITAMINB12, FOLATE, FERRITIN, TIBC, IRON, RETICCTPCT in the last 72 hours. Sepsis Labs: No results for input(s): PROCALCITON, LATICACIDVEN in the last 168 hours.  Recent Results (from the past 240 hour(s))  Urine Culture     Status: None   Collection Time: 10/05/18  3:57 PM   Result Value Ref Range Status   MICRO NUMBER: [id number]  Final   SPECIMEN QUALITY: Adequate  Final   Sample Source URINE  Final   STATUS: FINAL  Final   Result:   Final    Three or more organisms present, each greater than 10,000 CFU/mL. May represent normal flora contamination from external genitalia. No further testing is required.  SARS Coronavirus 2 (CEPHEID- Performed in [place] hospital lab), Hosp Order     Status: None   Collection Time: 10/06/18 10:16 PM  Result Value Ref Range Status   SARS Coronavirus 2 NEGATIVE NEGATIVE Final    Comment: (NOTE) If result is NEGATIVE SARS-CoV-2 target nucleic acids are NOT DETECTED. The SARS-CoV-2 RNA is generally detectable in upper and lower  respiratory specimens during the acute phase of infection. The lowest  concentration of SARS-CoV-2 viral copies this assay can detect is 250  copies / mL. A negative result does not preclude SARS-CoV-2 infection  and should not be used as the sole basis for treatment or other  patient management decisions.  A negative result may occur with  improper specimen collection / handling, submission of specimen other  than nasopharyngeal swab, presence of viral mutation(s) within the  areas targeted by this assay, and inadequate number of viral copies  (<250 copies / mL). A negative result must be combined with clinical  observations, patient history, and epidemiological information. If result is POSITIVE SARS-CoV-2 target nucleic acids are DETECTED. The SARS-CoV-2 RNA is generally detectable in upper and lower  respiratory specimens dur ing the acute phase of infection.  Positive  results are indicative of active infection with SARS-CoV-2.  Clinical  correlation with patient history and other diagnostic information is  necessary to determine patient infection status.  Positive results do  not rule out bacterial infection or co-infection with other viruses. If result is PRESUMPTIVE POSTIVE SARS-CoV-2  nucleic acids MAY BE PRESENT.   A presumptive positive result was obtained on the submitted specimen  and confirmed on repeat testing.  While 2019 novel coronavirus  (SARS-CoV-2) nucleic acids may be present in the submitted sample  additional confirmatory testing may be necessary for epidemiological  and / or clinical management purposes  to differentiate between  SARS-CoV-2 and other Sarbecovirus currently known to infect humans.  If clinically indicated additional testing with an alternate test  methodology [phone]) is advised. The SARS-CoV-2 RNA is generally  detectable in upper and lower respiratory sp ecimens during the acute  phase of infection. The expected result is Negative. Fact Sheet for  Patients:  StrictlyIdeas.no Fact Sheet for Healthcare Providers: BankingDealers.co.za This test is not yet approved or cleared by the [place] FDA and has been authorized for detection and/or diagnosis of SARS-CoV-2 by FDA under an Emergency Use Authorization (EUA).  This EUA will remain in effect (meaning this test can be used) for the duration of the COVID-19 declaration under Section 564(b)(1) of the Act, 21 U.S.C. section 360bbb-3(b)(1), unless the authorization is terminated or revoked sooner. Performed at [facility], [place] [address] [place], [place] [address]   Urine culture     Status: None   Collection Time: 10/07/18  1:20 AM  Result Value Ref Range Status   Specimen Description   Final    URINE, CATHETERIZED Performed at [place] [address], [place], [place] [address]    Special Requests   Final    NONE Performed at [facility], [place] [address] [place], [place] [address]    Culture   Final    NO GROWTH Performed at [facility], [place] [address], [place], [place] [address]    Report Status 10/07/2018 FINAL  Final  Surgical PCR screen     Status:  None   Collection Time: 10/07/18  3:11 AM  Result Value Ref Range Status   MRSA, PCR NEGATIVE NEGATIVE Final   Staphylococcus aureus NEGATIVE NEGATIVE Final    Comment: (NOTE) The Xpert SA Assay (FDA approved for NASAL specimens in patients 39 years of age and older), is one component of a comprehensive surveillance program. It is not intended to diagnose infection nor to guide or monitor treatment. Performed at [facility], [place] [address] [place], [place] [address]          Radiology Studies: No results found.      Scheduled Meds: . [MAR Hold] sodium chloride   Intravenous Once  . [MAR Hold] cephALEXin  500 mg Oral Q12H  . [MAR Hold] ezetimibe  10 mg Oral Daily  . [MAR Hold] fluticasone  2 spray Each Nare Daily  . [MAR Hold] furosemide  40 mg Intravenous BID  . [MAR Hold] levothyroxine  25 mcg Oral Q0600  . [MAR Hold] loratadine  10 mg Oral Daily  . [MAR Hold] magnesium oxide  400 mg Oral Daily  . povidone-iodine  2 application Topical Once  . [MAR Hold] rosuvastatin  40 mg Oral Daily   Continuous Infusions: . ceFAZolin    . lactated ringers       LOS: 2 days        Aline August, MD [facility] 10/09/2018, 10:34 AM

## 2018-10-09 NOTE — Interval H&P Note (Signed)
History and Physical Interval Note:  10/09/2018 10:43 AM  [name]  has presented today for surgery, with the diagnosis of DJD.  The various methods of treatment have been discussed with the patient and family. After consideration of risks, benefits and other options for treatment, the patient has consented to  Procedure(s): TOTAL HIP ARTHROPLASTY ANTERIOR APPROACH (Left) as a surgical intervention.  The patient's history has been reviewed, patient examined, no change in status, stable for surgery.  I have reviewed the patient's chart and labs.  Questions were answered to the patient's satisfaction.    Heparin gtt stopped 6 hrs ago. Received 2 units PRBCs. Ready for surgery.  The risks, benefits, and alternatives were discussed with the patient. There are risks associated with the surgery including, but not limited to, problems with anesthesia (death), infection, instability (giving out of the joint), dislocation, differences in leg length/angulation/rotation, fracture of bones, loosening or failure of implants, hematoma (blood accumulation) which may require surgical drainage, blood clots, pulmonary embolism, nerve injury (foot drop and lateral thigh numbness), and blood vessel injury. The patient understands these risks and elects to proceed.   Hilton Cork Corine Solorio

## 2018-10-09 NOTE — Progress Notes (Signed)
ISTAT obtained to recheck hemoglobin per anesthesia. Anesthesia stated to cancel blood transfusion at this time.

## 2018-10-09 NOTE — Progress Notes (Signed)
PT Cancellation Note  Patient Details Name: AZULA ZAPPIA MRN: [id number] DOB: 1934-04-20   Cancelled Treatment:    Reason Eval/Treat Not Completed: Other (comment). Currently scheduled for sx at ~9AM. Will follow-up for PT evaluation post-op as appropriate.  Mabeline Caras, PT, DPT Acute Rehabilitation Services  Pager [phone] Office [place] 10/09/2018, 7:35 AM

## 2018-10-09 NOTE — Progress Notes (Signed)
While reviewing patient's chart for surgery today, found patient's hemoglobin 7.5. consulted anesthesia. Anesthesia ordered two units PRBCs to be transfused. Floor RN made aware and stated patient received one unit PRBC last pm and had not had a recheck CBC this am. Anesthesia made aware and ordered recheck CBC prior to transfusion to assess hemoglobin level. Floor RN made aware.

## 2018-10-09 NOTE — Anesthesia Preprocedure Evaluation (Addendum)
Anesthesia Evaluation  Patient identified by MRN, date of birth, ID band Patient awake    Reviewed: Allergy & Precautions, NPO status , Patient's Chart, lab work & pertinent test results, reviewed documented beta blocker date and time   History of Anesthesia Complications (+) PONV and history of anesthetic complications  Airway Mallampati: II  TM Distance: >3 FB Neck ROM: Full    Dental no notable dental hx. (+) Chipped, Upper Dentures, Partial Lower, Missing   Pulmonary COPD, former smoker,    Pulmonary exam normal breath sounds clear to auscultation       Cardiovascular hypertension, Pt. on medications and Pt. on home beta blockers + angina + CAD, + Peripheral Vascular Disease and +CHF  Normal cardiovascular exam+ dysrhythmias Atrial Fibrillation + Cardiac Defibrillator  Rhythm:Regular Rate:Normal     Neuro/Psych PSYCHIATRIC DISORDERS Anxiety TIA   GI/Hepatic   Endo/Other    Renal/GU Renal disease     Musculoskeletal  (+) Arthritis ,   Abdominal   Peds  Hematology  (+) anemia ,   Anesthesia Other Findings MV regurg. Renal ca. EF 60-65 2 yr ago.  Reproductive/Obstetrics                             Anesthesia Physical  Anesthesia Plan  ASA: III  Anesthesia Plan: General   Post-op Pain Management:    Induction: Intravenous  PONV Risk Score and Plan: 4 or greater and Ondansetron, Dexamethasone, Midazolam, Scopolamine patch - Pre-op and Treatment may vary due to age or medical condition  Airway Management Planned: Oral ETT  Additional Equipment:   Intra-op Plan:   Post-operative Plan: Extubation in OR  Informed Consent: I have reviewed the patients History and Physical, chart, labs and discussed the procedure including the risks, benefits and alternatives for the proposed anesthesia with the patient or authorized representative who has indicated his/her understanding and  acceptance.     Dental advisory given  Plan Discussed with: CRNA  Anesthesia Plan Comments:         Anesthesia Quick Evaluation

## 2018-10-09 NOTE — Progress Notes (Signed)
Pt alarm on. [place] was out of nose and sats at 75%. O2 placed at 3L  and IS started. Sats up to 96%.

## 2018-10-09 NOTE — Transfer of Care (Signed)
Immediate Anesthesia Transfer of Care Note  Patient: [name]  Procedure(s) Performed: TOTAL HIP ARTHROPLASTY ANTERIOR APPROACH (Left )  Patient Location: PACU  Anesthesia Type:General  Level of Consciousness: awake, oriented and patient cooperative  Airway & Oxygen Therapy: Patient Spontanous Breathing and Patient connected to face mask oxygen  Post-op Assessment: Report given to RN and Post -op Vital signs reviewed and stable  Post vital signs: Reviewed  Last Vitals:  Vitals Value Taken Time  BP 151/68 10/09/2018  1:23 PM  Temp    Pulse 91 10/09/2018  1:26 PM  Resp 24 10/09/2018  1:26 PM  SpO2 97 % 10/09/2018  1:26 PM  Vitals shown include unvalidated device data.  Last Pain:  Vitals:   10/09/18 0812  TempSrc: Oral  PainSc:       Patients Stated Pain Goal: 2 (93/71/69 6789)  Complications: No apparent anesthesia complications

## 2018-10-09 NOTE — Anesthesia Postprocedure Evaluation (Signed)
Anesthesia Post Note  Patient: [name]  Procedure(s) Performed: TOTAL HIP ARTHROPLASTY ANTERIOR APPROACH (Left )     Patient location during evaluation: PACU Anesthesia Type: General Level of consciousness: awake and alert Pain management: pain level controlled Vital Signs Assessment: post-procedure vital signs reviewed and stable Respiratory status: spontaneous breathing, nonlabored ventilation and respiratory function stable Cardiovascular status: blood pressure returned to baseline and stable Postop Assessment: no apparent nausea or vomiting Anesthetic complications: no    Last Vitals:  Vitals:   10/09/18 1424 10/09/18 1430  BP: (!) 151/51   Pulse:  84  Resp:  18  Temp:    SpO2:  94%    Last Pain:  Vitals:   10/09/18 1400  TempSrc:   PainSc: 0-No pain                 Lynda Rainwater

## 2018-10-09 NOTE — Progress Notes (Signed)
Patient's daughter susan requested to be notified when patient went to the OR. Made her aware of that at this time.

## 2018-10-09 NOTE — Progress Notes (Signed)
ANTICOAGULATION CONSULT NOTE - Follow Up Consult  Pharmacy Consult for Coumadin and Heparin Indication: atrial fibrillation  Allergies  Allergen Reactions  . Codeine Nausea And Vomiting  . Morphine And Related Nausea And Vomiting  . Sulfonamide Derivatives Other (See Comments)    Dry mouth    Patient Measurements: Height: 5\' 6"  (167.6 cm) Weight: 169 lb 8.5 oz (76.9 kg) IBW/kg (Calculated) : 59.3 Heparin Dosing Weight: 75 kg  Vital Signs: Temp: 98.1 F (36.7 C) (06/05 1450) Temp Source: Oral (06/05 1450) BP: 135/58 (06/05 1450) Pulse Rate: 77 (06/05 1450)  Labs: Recent Labs    10/06/18 2220 10/07/18 0539 10/07/18 1137 10/07/18 2145 10/08/18 0600 10/08/18 1259 10/09/18 0850 10/09/18 1213  HGB 8.0* 8.3*  --   --  7.5*  --  11.6* 11.2*  HCT 29.6* 30.5*  --   --  28.3*  --  34.0* 33.0*  PLT 299 260  --   --  275  --   --   --   LABPROT 20.4*  --   --   --   --  18.3*  --   --   INR 1.8*  --   --   --   --  1.5*  --   --   HEPARINUNFRC  --   --  0.27* 0.62 0.47  --   --   --   CREATININE 1.26* 1.20*  --   --  1.52*  --   --   --     Estimated Creatinine Clearance: 29.9 mL/min (A) (by C-G formula based on SCr of 1.52 mg/dL (H)).  Assessment:  85 yr old female on Coumadin PTA for atrial fibrillation.  Admitted 6/2 after mechanical fall and left hip fracture.  INR 1.8 on admit.  Coumadin held and Heparin drip begun early am on 8/3.  Heparin drip held 6hrs pre-op today.  Now s/p L THA.  Coumadin to resume tonight and IV heparin to resume no sooner than 24hrs post-op.  AET 1327.  INR 1.5 on 6/4.   Last heparin level therapeutic (0.47) on 1200 units/hr.  Hgb down 6/4 and 2 units given, improved today.     PTA Coumadin: 2 mg daily except 1 mg on Sundays.  Last dose 6/1.   Goal of Therapy:  INR 2-3 Heparin level 0.3-0.7 units/ml Monitor platelets by anticoagulation protocol: Yes   Plan:   Coumadin 3 mg x 1 tonight.  Resume Heparin drip on 6/6 ~2pm at 1200 units/hr  Heparin level ~8 hrs after drip resumes.  Daily heparin level, PT/INR and CBC.  Arty Baumgartner, [place] Pager: [phone] or phone: [phone] 10/09/2018,3:39 PM

## 2018-10-09 NOTE — Progress Notes (Addendum)
Progress Note  Patient Name: [name] Date of Encounter: 10/09/2018  Primary Cardiologist: Virl Axe, MD   Subjective   Pt feels well after surgery. Her main complaint is a tremor in her left arm/hand that has developed since she has been in the hospital and has worsened.   She has not received lasix yet today.  Inpatient Medications    Scheduled Meds: . docusate sodium  100 mg Oral BID  . ezetimibe  10 mg Oral Daily  . fluticasone  2 spray Each Nare Daily  . furosemide  40 mg Intravenous BID  . levothyroxine  25 mcg Oral Q0600  . loratadine  10 mg Oral Daily  . magnesium oxide  400 mg Oral Daily  . rosuvastatin  40 mg Oral Daily  . warfarin  3 mg Oral ONCE-1800  . Warfarin - Pharmacist Dosing Inpatient   Does not apply q1800   Continuous Infusions: . ceFAZolin    .  ceFAZolin (ANCEF) IV    . [START ON 10/10/2018] heparin     PRN Meds: acetaminophen **OR** [DISCONTINUED] acetaminophen, albuterol, bisacodyl, ipratropium, magnesium citrate, menthol-cetylpyridinium **OR** phenol, metoCLOPramide **OR** metoCLOPramide (REGLAN) injection, naLOXone (NARCAN)  injection, ondansetron **OR** ondansetron (ZOFRAN) IV, senna-docusate, traMADol   Vital Signs    Vitals:   10/09/18 1415 10/09/18 1424 10/09/18 1430 10/09/18 1450  BP:  (!) 151/51  (!) 135/58  Pulse: 77  84 77  Resp: 16  18 17   Temp:   (!) 97 F (36.1 C) 98.1 F (36.7 C)  TempSrc:    Oral  SpO2: 100%  94% 92%  Weight:      Height:        Intake/Output Summary (Last 24 hours) at 10/09/2018 1559 Last data filed at 10/09/2018 1500 Gross per 24 hour  Intake 1785 ml  Output 1500 ml  Net 285 ml   Last 3 Weights 10/09/2018 10/07/2018 10/06/2018  Weight (lbs) 169 lb 8.5 oz 169 lb 8.5 oz 172 lb  Weight (kg) 76.9 kg 76.9 kg 78.019 kg      Telemetry    N/A - Personally Reviewed  ECG    No new tracings - Personally Reviewed  Physical Exam   GEN: No acute distress.   Neck: minimal JVD Cardiac: irregular  rhythm, regular rate, systolic murmur Respiratory: Clear to auscultation bilaterally in upper lobes, crackles in bases. GI: Soft, nontender, non-distended  MS: + LE edema; No deformity. Neuro:  Nonfocal, right arm/hand tremor, strong radial pulse Psych: Normal affect   Labs    Chemistry Recent Labs  Lab 10/06/18 2220 10/07/18 0539 10/08/18 0600 10/09/18 0850 10/09/18 1213  NA 137 134* 134* 135 135  K 2.8* 3.9 3.5 3.5 3.8  CL 96* 95* 94*  --   --   CO2 31 29 28   --   --   GLUCOSE 139* 154* 131* 105* 105*  BUN 17 16 27*  --   --   CREATININE 1.26* 1.20* 1.52*  --   --   CALCIUM 8.7* 7.9* 8.2*  --   --   GFRNONAA 40* 42* 32*  --   --   GFRAA 46* 49* 37*  --   --   ANIONGAP 10 10 12   --   --      Hematology Recent Labs  Lab 10/06/18 2220 10/07/18 0539 10/08/18 0600 10/09/18 0850 10/09/18 1213  WBC 7.4 5.6 8.0  --   --   RBC 4.49 4.65 4.20  --   --  HGB 8.0* 8.3* 7.5* 11.6* 11.2*  HCT 29.6* 30.5* 28.3* 34.0* 33.0*  MCV 65.9* 65.6* 67.4*  --   --   MCH 17.8* 17.8* 17.9*  --   --   MCHC 27.0* 27.2* 26.5*  --   --   RDW 20.9* 21.0* 20.9*  --   --   PLT 299 260 275  --   --     Cardiac EnzymesNo results for input(s): TROPONINI in the last 168 hours. No results for input(s): TROPIPOC in the last 168 hours.   BNP Recent Labs  Lab 10/06/18 2220  BNP 235.7*     DDimer  Recent Labs  Lab 10/07/18 0038  DDIMER 16.06*     Radiology    Pelvis Portable  Result Date: 10/09/2018 CLINICAL DATA:  Status post left total hip placement. EXAM: PORTABLE PELVIS 1-2 VIEWS COMPARISON:  Radiographs of May 03, 2011. FINDINGS: There is been interval placement of left total hip arthroplasty. The acetabular and femoral components appear to be well situated. Expected postoperative changes are seen in the surrounding soft tissues. Previously noted right total hip arthroplasty is again seen. No fracture or dislocation is noted. IMPRESSION: Status post left total hip arthroplasty.  Electronically Signed   By: Marijo Conception M.D.   On: 10/09/2018 15:11   Dg C-arm 1-60 Min  Result Date: 10/09/2018 CLINICAL DATA:  Left hip replacement. EXAM: OPERATIVE LEFT HIP (WITH PELVIS IF PERFORMED) 2 VIEWS TECHNIQUE: Fluoroscopic spot image(s) were submitted for interpretation post-operatively. COMPARISON:  10/06/2018. FINDINGS: Interval left hip prosthesis in satisfactory position and alignment. No fracture or dislocation seen. Stable right hip prosthesis. IMPRESSION: Satisfactory postoperative appearance of a left hip prosthesis. Electronically Signed   By: Claudie Revering M.D.   On: 10/09/2018 13:19   Dg Hip Operative Unilat W Or W/o Pelvis Left  Result Date: 10/09/2018 CLINICAL DATA:  Left hip replacement. EXAM: OPERATIVE LEFT HIP (WITH PELVIS IF PERFORMED) 2 VIEWS TECHNIQUE: Fluoroscopic spot image(s) were submitted for interpretation post-operatively. COMPARISON:  10/06/2018. FINDINGS: Interval left hip prosthesis in satisfactory position and alignment. No fracture or dislocation seen. Stable right hip prosthesis. IMPRESSION: Satisfactory postoperative appearance of a left hip prosthesis. Electronically Signed   By: Claudie Revering M.D.   On: 10/09/2018 13:19    Cardiac Studies   Echocardiogram 10/06/2018: Impressions: 1. The left ventricle has normal systolic function with an ejection fraction of 60-65%. The cavity size was normal. Left ventricular diastolic function could not be evaluated due to nondiagnostic images. 2. The right ventricle has normal systolic function. The cavity was mildly enlarged. There is moderately increased right ventricular wall thickness. Right ventricular systolic pressure is severely elevated with an estimated pressure of 93.2 mmHg. 3. Left atrial size was mildly dilated. 4. The mitral valve is degenerative. Mild thickening of the mitral valve leaflet. 5. The tricuspid valve is not well visualized. Tricuspid valve regurgitation is mild-moderate. 6. The  aortic valve was not well visualized. Mild thickening of the aortic valve. Aortic valve regurgitation is trivial by color flow Doppler. 7. The inferior vena cava was dilated in size with <50% respiratory variability.  Patient Profile     85 y.o. female with a history of non-obstructive CAD by cardiac catheterization in 1093, chronic diastolic CHFs/p ICD, permanent atrial fibrillation having failed multiple antiarrhythmic drugs and on Coumadin, bilateral carotid stenosis, hypertension, hyperlipidemia, and renal cancer,whopresented on 10/06/2018 for left hip pain after a mechanical fall and was found to have a left hip fracture. Cardiology was consulted  for pre-operative evaluation.   She underwent surgery today, recovering well.  Assessment & Plan    1. Acute on chronic diastolic heart failure - BNP elevated on arrival, CXR suggestive of CHF - echo with preserved EF of 60-65% - home meds: 40 mg torsemide daily, 5 mg metolazone once weekly - was diuresing on 40 mg IV lasix prior to surgery - pressures  - strict I&Os, daily weights - caution with fluids post-surgery - continue lasix 40 mg IV BID - she has not received a dose of lasix yet today, would give a dose this afternoon/tonight - need a repeat BMP to check renal function and K (odered) - on supplemental O2 (not on home O2)   2. CKD IV s/p nephrectomy - reduced renal function with sCr 1.52 on 6/4 - baseline is 1.2-1.5 - need repeat BMP in the AM - continue lasix as above   3. Permanent atrial fibrillation, AICD in place - rate-controlled on exam - on heparin for anticoagulation - will defer to ortho for timing of coumadin - monitor Hb closely    4. Anemia - received transfusion yesterday - Hb this morning 11.6 --> 11.2, improved from 7.5 on 10/08/18   5. Left femoral neck fracture after mechanical fall - surgery today, recovering well - maintenance fluids running at 20/hr   6. Left arm/hand tremor - she states this  started during this hospitalization and is getting worse - good movement and strength, intact radial pulse, warm - may need neurology consult       For questions or updates, please contact [place] Please consult [email or web address] for contact info under        Signed, Ledora Bottcher, PA  10/09/2018, 3:59 PM     History and all data above reviewed.  Patient examined.  I agree with the findings as above.  No acute SOB.  She feels shaking but no localizing neuro complaints.  The patient exam reveals COR:RRR  ,  Lungs: Clear  ,  Abd: Positive bowel sounds, no rebound no guarding, Ext  No edema  .  All available labs, radiology testing, previous records reviewed. Agree with documented assessment and plan.   Chronic diastolic HF:  Agree with plans for diuretics as above.   Lanae Boast Carlus Stay  5:34 PM  10/09/2018

## 2018-10-09 NOTE — Op Note (Signed)
OPERATIVE REPORT  SURGEON: Rod Can, MD   ASSISTANT: Staff.  PREOPERATIVE DIAGNOSIS: Displaced Left femoral neck fracture.   POSTOPERATIVE DIAGNOSIS: Displaced Left femoral neck fracture.   PROCEDURE: Left total hip arthroplasty, anterior approach.   IMPLANTS: DePuy Tri Lock stem, size 5, hi offset. DePuy Pinnacle Cup, size 52 mm. DePuy Altrx liner, size 36 by 52 mm, neutral. DePuy metal head ball, size 36 + 8.5 mm.  ANESTHESIA:  General  ESTIMATED BLOOD LOSS:-400 mL    ANTIBIOTICS: 2 g Ancef.  DRAINS: None.  COMPLICATIONS: None.   CONDITION: PACU - hemodynamically stable.   BRIEF CLINICAL NOTE: [name] is a 85 y.o. female with with history of chronic diastolic heart failure with pacemaker, atrial fibrillation on Coumadin, CKD, CAD, TIA, chronic anemia, recurrent urinary tract infection, and a remote history of renal cell cancer status post nephrectomy who was admitted with a displaced Left femoral neck fracture. The patient was admitted to the hospitalist service and underwent perioperative risk stratification and medical optimization. Coumadin was held. Heparin drip was stopped 6 hours before surgery. The risks, benefits, and alternatives to hemiarthroplasty were explained, and the patient elected to proceed.  PROCEDURE IN DETAIL: The patient was taken to the operating room and general anesthesia was induced on the hospital bed. The patient was then positioned on the Hana table. All bony prominences were well padded. The hip was prepped and draped in the normal sterile surgical fashion. A time-out was called verifying side and site of surgery. Antibiotics were given within 60 minutes of beginning the procedure.  The direct anterior approach to the hip was performed through the Hueter interval. Lateral femoral circumflex vessels were treated with the Auqumantys. The anterior capsule was exposed and an inverted T capsulotomy was made. Fracture hematoma  was encountered and evacuated. The patient was found to have a comminuted Left subcapital femoral neck fracture. I freshened the femoral neck cut with a saw. I removed the femoral neck fragment. A corkscrew was placed into the head and the head was removed. This was passed to the back table and was measured.  Acetabular exposure was achieved, and the pulvinar and labrum were excised. Sequential reaming of the acetabulum was then performed up to a size 51 mm reamer. A 52 mm cup was then opened and impacted into place at approximately 40 degrees of abduction and 20 degrees of anteversion. The final polyethylene liner was impacted into place and acetabular osteophytes were removed.   I then gained femoral exposure taking care to protect the abductors and greater trochanter. This was performed using standard external rotation, extension, and adduction. The capsule was peeled off the inner aspect of the greater trochanter, taking care to preserve the short external rotators. A cookie cutter was used to enter the femoral canal, and then the femoral canal finder was placed. Sequential broaching was performed up to a size 5. Calcar planer was used on the femoral neck remnant. I placed a hi offset neck and a trial head ball. The hip was reduced. Leg lengths and offset were checked fluoroscopically. The hip was dislocated and trial components were removed. The final implants were placed, and the hip was reduced.  Fluoroscopy was used to confirm component position and leg lengths. At 90 degrees of external rotation and full extension, the hip was stable to an anterior directed force.  The wound was copiously irrigated with normal saline using pulse lavage. Marcaine solution was injected into the periarticular soft tissue. The wound was closed in  layers using #1 Vicryl and V-Loc for the fascia, 2-0 Vicryl for the subcutaneous fat, 2-0 Monocryl for the deep dermal layer, 3-0 running Monocryl subcuticular  stitch, and Dermabond for the skin. Once the glue was fully dried, an Aquacell Ag dressing was applied. The patient was transported to the recovery room in stable condition. Sponge, needle, and instrument counts were correct at the end of the case x2. The patient tolerated the procedure well and there were no known complications.  Postoperatively, the patient be readmitted to the hospitalist.  She may weight-bear as tolerated with a walker.  Resume Coumadin tonight.  If the heparin drip needs to be restarted, please wait at least 24 hours and initiate protocol without bolus.  She will work with physical therapy and undergo disposition planning.  Return to the office for routine two-week postoperative care.

## 2018-10-09 NOTE — Anesthesia Procedure Notes (Signed)
Procedure Name: Intubation Date/Time: 10/09/2018 11:00 AM Performed by: Trinna Post., CRNA Pre-anesthesia Checklist: Patient identified, Emergency Drugs available, Suction available, Patient being monitored and Timeout performed Patient Re-evaluated:Patient Re-evaluated prior to induction Oxygen Delivery Method: Circle system utilized Preoxygenation: Pre-oxygenation with 100% oxygen Induction Type: IV induction, Rapid sequence and Cricoid Pressure applied Laryngoscope Size: Mac and 3 Grade View: Grade I Tube type: Oral Tube size: 7.0 mm Number of attempts: 1 Airway Equipment and Method: Stylet Placement Confirmation: ETT inserted through vocal cords under direct vision,  positive ETCO2 and breath sounds checked- equal and bilateral Secured at: 22 cm Tube secured with: Tape Dental Injury: Teeth and Oropharynx as per pre-operative assessment

## 2018-10-09 NOTE — Progress Notes (Signed)
Daughter and granddaughter updated multiple times throughout the day of patient's plan. Updated pt's granddaughter of the latest plan as of now. Will continue to monitor pt.

## 2018-10-10 ENCOUNTER — Inpatient Hospital Stay (HOSPITAL_COMMUNITY): Payer: PPO

## 2018-10-10 LAB — BPAM RBC
Blood Product Expiration Date: 202006252359
Blood Product Expiration Date: 202006252359
ISSUE DATE / TIME: 202006041502
Unit Type and Rh: 600
Unit Type and Rh: 600

## 2018-10-10 LAB — BASIC METABOLIC PANEL
Anion gap: 11 (ref 5–15)
BUN: 29 mg/dL — ABNORMAL HIGH (ref 8–23)
CO2: 28 mmol/L (ref 22–32)
Calcium: 8.4 mg/dL — ABNORMAL LOW (ref 8.9–10.3)
Chloride: 98 mmol/L (ref 98–111)
Creatinine, Ser: 1.59 mg/dL — ABNORMAL HIGH (ref 0.44–1.00)
GFR calc Af Amer: 35 mL/min — ABNORMAL LOW (ref >60)
GFR calc non Af Amer: 30 mL/min — ABNORMAL LOW (ref >60)
Glucose, Bld: 142 mg/dL — ABNORMAL HIGH (ref 70–99)
Potassium: 3.8 mmol/L (ref 3.5–5.1)
Sodium: 137 mmol/L (ref 135–145)

## 2018-10-10 LAB — TYPE AND SCREEN
ABO/RH(D): A NEG
Antibody Screen: NEGATIVE
Unit division: 0
Unit division: 0

## 2018-10-10 LAB — CBC
HCT: 28.1 % — ABNORMAL LOW (ref 36.0–46.0)
Hemoglobin: 8 g/dL — ABNORMAL LOW (ref 12.0–15.0)
MCH: 19.6 pg — ABNORMAL LOW (ref 26.0–34.0)
MCHC: 28.5 g/dL — ABNORMAL LOW (ref 30.0–36.0)
MCV: 68.9 fL — ABNORMAL LOW (ref 80.0–100.0)
Platelets: 216 10*3/uL (ref 150–400)
RBC: 4.08 MIL/uL (ref 3.87–5.11)
RDW: 22.9 % — ABNORMAL HIGH (ref 11.5–15.5)
WBC: 8 10*3/uL (ref 4.0–10.5)
nRBC: 0 % (ref 0.0–0.2)

## 2018-10-10 LAB — PROTIME-INR
INR: 1.6 — ABNORMAL HIGH (ref 0.8–1.2)
Prothrombin Time: 19.1 seconds — ABNORMAL HIGH (ref 11.4–15.2)

## 2018-10-10 LAB — MAGNESIUM: Magnesium: 2.2 mg/dL (ref 1.7–2.4)

## 2018-10-10 MED ORDER — WARFARIN SODIUM 3 MG PO TABS
3.0000 mg | ORAL_TABLET | Freq: Once | ORAL | Status: AC
  Administered 2018-10-10: 3 mg via ORAL
  Filled 2018-10-10: qty 1

## 2018-10-10 NOTE — Progress Notes (Signed)
   Subjective: 1 Day Post-Op Procedure(s) (LRB): TOTAL HIP ARTHROPLASTY ANTERIOR APPROACH (Left)  Recheck left hip 1 day s/p anterior total hip Pt resting comfortably with no acute distress Mild soreness/pain in the anterior hip  Patient reports pain as mild.  Objective:   VITALS:   Vitals:   10/10/18 0053 10/10/18 0422  BP: (!) 133/59 (!) 136/53  Pulse: 76 72  Resp:    Temp: 97.8 F (36.6 C) 97.7 F (36.5 C)  SpO2: 100% 98%    Left hip incision healing well nv intact distally No rashes or edema distally Mild soreness with rom  LABS Recent Labs    10/08/18 0600 10/09/18 0850 10/09/18 1213 10/10/18 0243  HGB 7.5* 11.6* 11.2* 8.0*  HCT 28.3* 34.0* 33.0* 28.1*  WBC 8.0  --   --  8.0  PLT 275  --   --  216    Recent Labs    10/08/18 0600  10/09/18 1213 10/09/18 1643 10/10/18 0243  NA 134*   < > 135 137 137  K 3.5   < > 3.8 3.7 3.8  BUN 27*  --   --  24* 29*  CREATININE 1.52*  --   --  1.48* 1.59*  GLUCOSE 131*   < > 105* 160* 142*   < > = values in this interval not displayed.     Assessment/Plan: 1 Day Post-Op Procedure(s) (LRB): TOTAL HIP ARTHROPLASTY ANTERIOR APPROACH (Left) PT/OT Pain management Pulmonary toilet Will continue to monitor her progress    Merla Riches PA-C, [place] [facility] now [employer] [facility] [address], [place], [place], [name] [address] Phone: [phone] [email or web address] Facebook  [employer]

## 2018-10-10 NOTE — Progress Notes (Signed)
Pt and pt's family is requesting [name] for rehab.

## 2018-10-10 NOTE — Progress Notes (Signed)
Pt desats with activity and has SOB with movement. At beginning of shift pt was able to be on 1L of O2 however, when pt tried to move up in bed or turn from side to side she desats to the mid 70s - 80s. Pt stated she felt dizzy for a moment. Increased pt's O2 to 2L. O2 sats now 98%. Pt is up eating breakfast. Bed in high fowlers position. Call light beside patient. Will notify MD and continue to monitor pt.

## 2018-10-10 NOTE — Progress Notes (Signed)
Patient's daughter [name] called requesting to speak with the doctor related to her mother's lab values and the tentative discharge plans.

## 2018-10-10 NOTE — Evaluation (Signed)
Physical Therapy Evaluation Patient Details Name: [name] MRN: [id number] DOB: 02-22-1934 Today's Date: 10/10/2018   History of Present Illness  Pt is an 85 y.o. female admitted 10/06/18 after fall sustaining L hip fx. S/p L THA on 10/09/18. PMH includes frequent falls, arthritis, CHF, CKD, CAD, afib, AICD, s/p R THA (2012)  Clinical Impression  Pt admitted with above diagnosis. Pt currently with functional limitations due to the deficits listed below (see PT Problem List). Pt very determined to mobilize. Needed min A for bed mobility and transfer to [facility] and then short walk to recliner. Distance limited today by mild dizziness. SpO2 92-96% on 2L O2. Pt very tender B calves R>L she reports due to swelling. Recommend SNF at d/c and pt agreeable, requesting [facility] because grandson works there.  Pt will benefit from skilled PT to increase their independence and safety with mobility to allow discharge to the venue listed below.       Follow Up Recommendations CIR    Equipment Recommendations  None recommended by PT    Recommendations for Other Services       Precautions / Restrictions Precautions Precautions: Fall Restrictions Weight Bearing Restrictions: Yes LLE Weight Bearing: Weight bearing as tolerated      Mobility  Bed Mobility Overal bed mobility: Needs Assistance Bed Mobility: Supine to Sit     Supine to sit: Min assist     General bed mobility comments: needs a lot of extra time but prefers to move herself without physical assist when possible. Min HHA to reach fully erect trunk  Transfers Overall transfer level: Needs assistance Equipment used: Rolling walker (2 wheeled) Transfers: Sit to/from [facility] Sit to Stand: Min assist Stand pivot transfers: Min assist       General transfer comment: min A to stabilize RW and power up from [facility]. Min A to steady with pivot to [facility]. vc's for safety and mvmt of RW  Ambulation/Gait Ambulation/Gait  assistance: Min assist Gait Distance (Feet): 2 Feet Assistive device: Rolling walker (2 wheeled) Gait Pattern/deviations: Step-to pattern;Antalgic Gait velocity: decreased Gait velocity interpretation: <1.31 ft/sec, indicative of household ambulator General Gait Details: vc's for sequencing. Distanced minimized due to pt being mildly dizzy  Stairs            Wheelchair Mobility    Modified Rankin (Stroke Patients Only)       Balance Overall balance assessment: Needs assistance;History of Falls Sitting-balance support: No upper extremity supported Sitting balance-Leahy Scale: Good     Standing balance support: Bilateral upper extremity supported Standing balance-Leahy Scale: Poor                               Pertinent Vitals/Pain Pain Assessment: Faces Faces Pain Scale: Hurts little more Pain Location: L hip and B lower legs Pain Descriptors / Indicators: Sore Pain Intervention(s): Limited activity within patient's tolerance;Monitored during session    Home Living Family/patient expects to be discharged to:: Private residence Living Arrangements: Alone Available Help at Discharge: Family;Available PRN/intermittently Type of Home: House Home Access: Stairs to enter   [employer] of Steps: 1 Home Layout: One level Home Equipment: Bedside commode;Walker - 2 wheels Additional Comments: family can help intermittently    Prior Function Level of Independence: Independent               Hand Dominance        Extremity/Trunk Assessment   Upper Extremity Assessment  Upper Extremity Assessment: Overall WFL for tasks assessed    Lower Extremity Assessment Lower Extremity Assessment: RLE deficits/detail;LLE deficits/detail RLE Deficits / Details: very tender R calf. hip flex 4/5, knee ext 4/5 RLE Sensation: WNL RLE Coordination: WNL LLE Deficits / Details: hip flex 2/5, knee ext 2/5, tender L calf but not as much as R LLE Sensation:  WNL LLE Coordination: WNL    Cervical / Trunk Assessment Cervical / Trunk Assessment: Normal  Communication   Communication: No difficulties  Cognition Arousal/Alertness: Awake/alert Behavior During Therapy: WFL for tasks assessed/performed Overall Cognitive Status: Within Functional Limits for tasks assessed                                 General Comments: repeated self several times and processed a bit slowly, assume this is probably baseline      General Comments General comments (skin integrity, edema, etc.): Pt on 2L O2, SpO2 92-96%    Exercises Total Joint Exercises Ankle Circles/Pumps: AROM;Both;10 reps;Supine;Seated Quad Sets: AROM;Both;10 reps;Seated Gluteal Sets: AROM;Both;10 reps;Seated Long Arc Quad: AROM;Left;5 reps;Seated   Assessment/Plan    PT Assessment Patient needs continued PT services  PT Problem List Decreased strength;Decreased activity tolerance;Decreased balance;Decreased mobility;Decreased knowledge of use of DME;Decreased knowledge of precautions;Pain       PT Treatment Interventions DME instruction;Gait training;Stair training;Functional mobility training;Therapeutic activities;Therapeutic exercise;Balance training;Neuromuscular re-education;Patient/family education    PT Goals (Current goals can be found in the Care Plan section)  Acute Rehab PT Goals Patient Stated Goal: return home PT Goal Formulation: With patient Time For Goal Achievement: 10/24/18 Potential to Achieve Goals: Good    Frequency Min 5X/week   Barriers to discharge Decreased caregiver support      Co-evaluation               AM-PAC PT "6 Clicks" Mobility  Outcome Measure Help needed turning from your back to your side while in a flat bed without using bedrails?: A Little Help needed moving from lying on your back to sitting on the side of a flat bed without using bedrails?: A Little Help needed moving to and from a bed to a chair (including a  wheelchair)?: A Little Help needed standing up from a chair using your arms (e.g., wheelchair or bedside chair)?: A Little Help needed to walk in hospital room?: A Lot Help needed climbing 3-5 steps with a railing? : Total 6 Click Score: 15    End of Session Equipment Utilized During Treatment: Gait belt Activity Tolerance: Patient tolerated treatment well Patient left: in chair;with call bell/phone within reach;with chair alarm set Nurse Communication: Mobility status PT Visit Diagnosis: Unsteadiness on feet (R26.81);History of falling (Z91.81);Pain;Difficulty in walking, not elsewhere classified (R26.2) Pain - Right/Left: Left Pain - part of body: Leg;Hip    Time: 2563-8937 PT Time Calculation (min) (ACUTE ONLY): 47 min   Charges:   PT Evaluation $PT Eval Moderate Complexity: 1 Mod PT Treatments $Gait Training: 8-22 mins $Therapeutic Activity: 8-22 mins        Leighton Roach, [place]  Pager [phone] Office [place] 10/10/2018, 10:31 AM

## 2018-10-10 NOTE — Progress Notes (Signed)
Patient ID: [name], female   DOB: October 26, 1933, 85 y.o.   MRN: [id number]  PROGRESS NOTE    KRIYA WESTRA  DDU:[id number] DOB: Oct 18, 1933 DOA: 10/06/2018 PCP: Elby Beck, FNP   Brief Narrative:   85 year old female with history of chronic diastolic heart failure with pacemaker, atrial fibrillation on Coumadin, CKD, CAD, TIA, chronic anemia, recurrent urinary tract infection, remote history of renal cell cancer status post nephrectomy presented on 10/06/2018 with left hip pain status post mechanical fall.  She was found to have minimally displaced left femoral neck fracture.  Orthopedics was consulted.  Cardiology was also consulted given extensive cardiac history.  She was transferred to [facility] on 10/08/2018 for orthopedic intervention.  Assessment & Plan:   Active Problems:   Acute on chronic diastolic CHF (congestive heart failure) (HCC)   Preoperative cardiovascular examination   Anemia   Closed left hip fracture (HCC)   Fall   Hypokalemia   Somnolence   Displaced fracture of left femoral neck (HCC)  Left femoral neck fracture following mechanical fall -Orthopedics following.  Status post left total hip arthroplasty on 10/09/2018.  Follow further recommendations from orthopedics. -Fall precautions -PT/OT evaluation.  Might need rehab -Pain management  Acute respiratory failure with hypercarbia along with sleepiness and drowsiness -CT chest did not show any PE but showed bilateral moderate pleural effusion.  She was treated with aggressive diuresis -Subsequently patient became excessively drowsy/sleepy on the afternoon of 10/07/2018 as she was on Dilaudid for pain.  ABG showed hypercarbia.  Dilaudid was discontinued.  She responded very well to Narcan.  Subsequently respiratory status has improved.  Currently on 2 L oxygen supplementation via nasal cannula.  She feels more short of breath though.  Will get portable chest x-ray. -Monitor mental status.  Acute  on chronic diastolic heart failure -Echo on 10/06/2018 showed EF of 60 to 65% with severe pulmonary hypertension -Strict input and output.  Daily weights.  Fluid restriction. -Cardiology following.  Currently on Lasix 40 mg IV twice daily.  Chronic atrial fibrillation with history of pacemaker -On Coumadin at home.  Has been on heparin since admission.  Coumadin has been restarted.  Monitor INR. -Currently rate controlled.  Chronic disease history--monitor creatinine.  Baseline creatinine ranged from 1.2-1.6  Anemia of chronic disease from chronic kidney disease -Baseline hemoglobin is around 8. Status post 2 units packed red cell transfusion per orthopedics on 10/08/2018.  Hemoglobin 8 today.  History of recent UTI -On Keflex at home.  Urine culture here did not show any growth.  Keflex has been discontinued.  Hyperlipidemia--continue Zetia, Crestor  History of recent weight loss -Apparently lost around 20 pounds over the last 6 months with decreased appetite.  Patient has history of renal cell cancer currently and depression, status post nephrectomy in 2003.  She will need outpatient follow-up with PCP regarding the same to rule out any new or recurrent malignancy.  DVT prophylaxis: Heparin drip/Coumadin Code Status: Full Family Communication: None at bedside Disposition Plan: Might need rehab.  Consultants: Orthopedic/cardiology  Procedures:  Echo  IMPRESSIONS    1. The left ventricle has normal systolic function with an ejection fraction of 60-65%. The cavity size was normal. Left ventricular diastolic function could not be evaluated due to nondiagnostic images.  2. The right ventricle has normal systolic function. The cavity was mildly enlarged. There is moderately increased right ventricular wall thickness. Right ventricular systolic pressure is severely elevated with an estimated pressure of 93.2 mmHg.  3.  Left atrial size was mildly dilated.  4. The mitral valve is  degenerative. Mild thickening of the mitral valve leaflet.  5. The tricuspid valve is not well visualized. Tricuspid valve regurgitation is mild-moderate.  6. The aortic valve was not well visualized. Mild thickening of the aortic valve. Aortic valve regurgitation is trivial by color flow Doppler.  7. The inferior vena cava was dilated in size with <50% respiratory variability.  Antimicrobials:  Anti-infectives (From admission, onward)   Start     Dose/Rate Route Frequency Ordered Stop   10/09/18 1700  ceFAZolin (ANCEF) IVPB 2g/100 mL premix     2 g 200 mL/hr over 30 Minutes Intravenous Every 6 hours 10/09/18 1456 10/10/18 0025   10/09/18 0855  ceFAZolin (ANCEF) 2-4 GM/100ML-% IVPB    Note to Pharmacy:  Nyoka Cowden   : cabinet override      10/09/18 0855 10/09/18 2059   10/08/18 0600  ceFAZolin (ANCEF) IVPB 2g/100 mL premix  Status:  Discontinued     2 g 200 mL/hr over 30 Minutes Intravenous On call to O.R. 10/08/18 0248 10/09/18 0559   10/07/18 0300  cephALEXin (KEFLEX) capsule 500 mg  Status:  Discontinued     500 mg Oral Every 12 hours 10/07/18 0248 10/09/18 1453       Subjective: Patient seen and examined at bedside.  She feels slightly short of breath.  Denies any chest pain, fever, vomiting or abdominal pain.  Had some tremors yesterday. Objective: Vitals:   10/10/18 0422 10/10/18 0817 10/10/18 0820 10/10/18 0824  BP: (!) 136/53 (!) 144/58    Pulse: 72 73    Resp:  16    Temp: 97.7 F (36.5 C) 97.6 F (36.4 C)    TempSrc: Oral Oral    SpO2: 98% 91% (!) 75% 98%  Weight:      Height:        Intake/Output Summary (Last 24 hours) at 10/10/2018 0959 Last data filed at 10/10/2018 0900 Gross per 24 hour  Intake 1590 ml  Output 700 ml  Net 890 ml   Filed Weights   10/06/18 2140 10/07/18 0323 10/09/18 0905  Weight: 78 kg 76.9 kg 76.9 kg    Examination:  General exam: Appears awake.  Answers some more questions today.  Poor historian.  No acute distress.  Respiratory system: Bilateral decreased breath sounds at bases, scattered crackles.  No wheezing Cardiovascular system: Rate controlled, S1 & S2 heard Gastrointestinal system: Abdomen is nondistended, soft and nontender. Normal bowel sounds heard. Extremities: No cyanosis; trace edema   Data Reviewed: I have personally reviewed following labs and imaging studies  CBC: Recent Labs  Lab 10/06/18 2220 10/07/18 0539 10/08/18 0600 10/09/18 0850 10/09/18 1213 10/10/18 0243  WBC 7.4 5.6 8.0  --   --  8.0  NEUTROABS 6.6  --   --   --   --   --   HGB 8.0* 8.3* 7.5* 11.6* 11.2* 8.0*  HCT 29.6* 30.5* 28.3* 34.0* 33.0* 28.1*  MCV 65.9* 65.6* 67.4*  --   --  68.9*  PLT 299 260 275  --   --  701   Basic Metabolic Panel: Recent Labs  Lab 10/06/18 2220 10/07/18 0539 10/08/18 0600 10/09/18 0850 10/09/18 1213 10/09/18 1643 10/10/18 0243  NA 137 134* 134* 135 135 137 137  K 2.8* 3.9 3.5 3.5 3.8 3.7 3.8  CL 96* 95* 94*  --   --  95* 98  CO2 31 29 28   --   --  29 28  GLUCOSE 139* 154* 131* 105* 105* 160* 142*  BUN 17 16 27*  --   --  24* 29*  CREATININE 1.26* 1.20* 1.52*  --   --  1.48* 1.59*  CALCIUM 8.7* 7.9* 8.2*  --   --  8.6* 8.4*  MG 1.9  --   --   --   --   --  2.2   GFR: Estimated Creatinine Clearance: 28.6 mL/min (A) (by C-G formula based on SCr of 1.59 mg/dL (H)). Liver Function Tests: No results for input(s): AST, ALT, ALKPHOS, BILITOT, PROT, ALBUMIN in the last 168 hours. No results for input(s): LIPASE, AMYLASE in the last 168 hours. No results for input(s): AMMONIA in the last 168 hours. Coagulation Profile: Recent Labs  Lab 10/06/18 2220 10/08/18 1259 10/10/18 0243  INR 1.8* 1.5* 1.6*   Cardiac Enzymes: No results for input(s): CKTOTAL, CKMB, CKMBINDEX, TROPONINI in the last 168 hours. BNP (last 3 results) No results for input(s): PROBNP in the last 8760 hours. HbA1C: No results for input(s): HGBA1C in the last 72 hours. CBG: Recent Labs  Lab 10/08/18  0510  GLUCAP 115*   Lipid Profile: No results for input(s): CHOL, HDL, LDLCALC, TRIG, CHOLHDL, LDLDIRECT in the last 72 hours. Thyroid Function Tests: No results for input(s): TSH, T4TOTAL, FREET4, T3FREE, THYROIDAB in the last 72 hours. Anemia Panel: No results for input(s): VITAMINB12, FOLATE, FERRITIN, TIBC, IRON, RETICCTPCT in the last 72 hours. Sepsis Labs: No results for input(s): PROCALCITON, LATICACIDVEN in the last 168 hours.  Recent Results (from the past 240 hour(s))  Urine Culture     Status: None   Collection Time: 10/05/18  3:57 PM  Result Value Ref Range Status   MICRO NUMBER: [id number]  Final   SPECIMEN QUALITY: Adequate  Final   Sample Source URINE  Final   STATUS: FINAL  Final   Result:   Final    Three or more organisms present, each greater than 10,000 CFU/mL. May represent normal flora contamination from external genitalia. No further testing is required.  SARS Coronavirus 2 (CEPHEID- Performed in [place] hospital lab), Hosp Order     Status: None   Collection Time: 10/06/18 10:16 PM  Result Value Ref Range Status   SARS Coronavirus 2 NEGATIVE NEGATIVE Final    Comment: (NOTE) If result is NEGATIVE SARS-CoV-2 target nucleic acids are NOT DETECTED. The SARS-CoV-2 RNA is generally detectable in upper and lower  respiratory specimens during the acute phase of infection. The lowest  concentration of SARS-CoV-2 viral copies this assay can detect is 250  copies / mL. A negative result does not preclude SARS-CoV-2 infection  and should not be used as the sole basis for treatment or other  patient management decisions.  A negative result may occur with  improper specimen collection / handling, submission of specimen other  than nasopharyngeal swab, presence of viral mutation(s) within the  areas targeted by this assay, and inadequate number of viral copies  (<250 copies / mL). A negative result must be combined with clinical  observations, patient history, and  epidemiological information. If result is POSITIVE SARS-CoV-2 target nucleic acids are DETECTED. The SARS-CoV-2 RNA is generally detectable in upper and lower  respiratory specimens dur ing the acute phase of infection.  Positive  results are indicative of active infection with SARS-CoV-2.  Clinical  correlation with patient history and other diagnostic information is  necessary to determine patient infection status.  Positive results do  not rule out  bacterial infection or co-infection with other viruses. If result is PRESUMPTIVE POSTIVE SARS-CoV-2 nucleic acids MAY BE PRESENT.   A presumptive positive result was obtained on the submitted specimen  and confirmed on repeat testing.  While 2019 novel coronavirus  (SARS-CoV-2) nucleic acids may be present in the submitted sample  additional confirmatory testing may be necessary for epidemiological  and / or clinical management purposes  to differentiate between  SARS-CoV-2 and other Sarbecovirus currently known to infect humans.  If clinically indicated additional testing with an alternate test  methodology [phone]) is advised. The SARS-CoV-2 RNA is generally  detectable in upper and lower respiratory sp ecimens during the acute  phase of infection. The expected result is Negative. Fact Sheet for Patients:  StrictlyIdeas.no Fact Sheet for Healthcare Providers: BankingDealers.co.za This test is not yet approved or cleared by the [place] FDA and has been authorized for detection and/or diagnosis of SARS-CoV-2 by FDA under an Emergency Use Authorization (EUA).  This EUA will remain in effect (meaning this test can be used) for the duration of the COVID-19 declaration under Section 564(b)(1) of the Act, 21 U.S.C. section 360bbb-3(b)(1), unless the authorization is terminated or revoked sooner. Performed at [facility], [place] [address] [place], [place] [address]    Urine culture     Status: None   Collection Time: 10/07/18  1:20 AM  Result Value Ref Range Status   Specimen Description   Final    URINE, CATHETERIZED Performed at [place] [address], [place], [place] [address]    Special Requests   Final    NONE Performed at [facility], [place] [address] [place], [place] [address]    Culture   Final    NO GROWTH Performed at [facility], [place] [address] [place], [place] [address]    Report Status 10/07/2018 FINAL  Final  Surgical PCR screen     Status: None   Collection Time: 10/07/18  3:11 AM  Result Value Ref Range Status   MRSA, PCR NEGATIVE NEGATIVE Final   Staphylococcus aureus NEGATIVE NEGATIVE Final    Comment: (NOTE) The Xpert SA Assay (FDA approved for NASAL specimens in patients 56 years of age and older), is one component of a comprehensive surveillance program. It is not intended to diagnose infection nor to guide or monitor treatment. Performed at [facility], [place] [address] [place],  [address]          Radiology Studies: Pelvis Portable  Result Date: 10/09/2018 CLINICAL DATA:  Status post left total hip placement. EXAM: PORTABLE PELVIS 1-2 VIEWS COMPARISON:  Radiographs of May 03, 2011. FINDINGS: There is been interval placement of left total hip arthroplasty. The acetabular and femoral components appear to be well situated. Expected postoperative changes are seen in the surrounding soft tissues. Previously noted right total hip arthroplasty is again seen. No fracture or dislocation is noted. IMPRESSION: Status post left total hip arthroplasty. Electronically Signed   By: Marijo Conception M.D.   On: 10/09/2018 15:11   Dg C-arm 1-60 Min  Result Date: 10/09/2018 CLINICAL DATA:  Left hip replacement. EXAM: OPERATIVE LEFT HIP (WITH PELVIS IF PERFORMED) 2 VIEWS TECHNIQUE: Fluoroscopic spot image(s) were submitted for interpretation  post-operatively. COMPARISON:  10/06/2018. FINDINGS: Interval left hip prosthesis in satisfactory position and alignment. No fracture or dislocation seen. Stable right hip prosthesis. IMPRESSION: Satisfactory postoperative appearance of a left hip prosthesis. Electronically Signed   By: Remo Lipps  Joneen Caraway M.D.   On: 10/09/2018 13:19   Dg Hip Operative Unilat W Or W/o Pelvis Left  Result Date: 10/09/2018 CLINICAL DATA:  Left hip replacement. EXAM: OPERATIVE LEFT HIP (WITH PELVIS IF PERFORMED) 2 VIEWS TECHNIQUE: Fluoroscopic spot image(s) were submitted for interpretation post-operatively. COMPARISON:  10/06/2018. FINDINGS: Interval left hip prosthesis in satisfactory position and alignment. No fracture or dislocation seen. Stable right hip prosthesis. IMPRESSION: Satisfactory postoperative appearance of a left hip prosthesis. Electronically Signed   By: Claudie Revering M.D.   On: 10/09/2018 13:19        Scheduled Meds: . docusate sodium  100 mg Oral BID  . ezetimibe  10 mg Oral Daily  . fluticasone  2 spray Each Nare Daily  . furosemide  40 mg Intravenous BID  . levothyroxine  25 mcg Oral Q0600  . loratadine  10 mg Oral Daily  . magnesium oxide  400 mg Oral Daily  . rosuvastatin  40 mg Oral Daily  . Warfarin - Pharmacist Dosing Inpatient   Does not apply q1800   Continuous Infusions: . heparin       LOS: 3 days        Aline August, MD [facility] 10/10/2018, 9:59 AM

## 2018-10-10 NOTE — Progress Notes (Signed)
Patient reports that she is in pain.   Per bedside report and confirmation with patient she can not have narcotic medications. Per bedside report patient was previously administered Narcan related to a narcotic event. Patient agrees.  RN administered Tylenol for pain management. Nursing will continue to monitor.

## 2018-10-10 NOTE — Progress Notes (Signed)
ANTICOAGULATION CONSULT NOTE - Follow Up Consult  Pharmacy Consult for Warfarin and Heparin Indication: atrial fibrillation  Allergies  Allergen Reactions  . Codeine Nausea And Vomiting  . Morphine And Related Nausea And Vomiting  . Sulfonamide Derivatives Other (See Comments)    Dry mouth    Patient Measurements: Height: 5\' 6"  (167.6 cm) Weight: 169 lb 8.5 oz (76.9 kg) IBW/kg (Calculated) : 59.3 Heparin Dosing Weight: 75 kg  Vital Signs: Temp: 97.6 F (36.4 C) (06/06 0817) Temp Source: Oral (06/06 0817) BP: 144/58 (06/06 0817) Pulse Rate: 73 (06/06 0817)  Labs: Recent Labs    10/07/18 1137 10/07/18 2145  10/08/18 0600 10/08/18 1259 10/09/18 0850 10/09/18 1213 10/09/18 1643 10/10/18 0243  HGB  --   --    < > 7.5*  --  11.6* 11.2*  --  8.0*  HCT  --   --    < > 28.3*  --  34.0* 33.0*  --  28.1*  PLT  --   --   --  275  --   --   --   --  216  LABPROT  --   --   --   --  18.3*  --   --   --  19.1*  INR  --   --   --   --  1.5*  --   --   --  1.6*  HEPARINUNFRC 0.27* 0.62  --  0.47  --   --   --   --   --   CREATININE  --   --   --  1.52*  --   --   --  1.48* 1.59*   < > = values in this interval not displayed.    Estimated Creatinine Clearance: 28.6 mL/min (A) (by C-G formula based on SCr of 1.59 mg/dL (H)).  Assessment:  85 year old female on warfarin PTA for atrial fibrillation. Admitted 6/2 after mechanical fall and left hip fracture. INR 1.8 on admit. Warfarin held and heparin infusion begun early am on 6/3. Heparin infusion held 6 hours prior to L THA. Post-operatively on 6/5, pharmacy consulted to resume warfarin that night and IV heparin to resume no sooner than 24 hours post-op. AET 6/5 at 1327. Last heparin level therapeutic (0.47) on 1200 units/hr.  Received 2 units PRBCs on 6/4.   PTA Warfarin dose: 2mg  daily except 1mg  on Sundays per patient. Last dose 6/1. Of note, per last coumadin clinic note on 09/24/18, dose was to have been increased to 2mg  every  day, but patient says she was not instructed on this change.   Today, 10/10/18:  INR 1.6  CBC decreased 11.2 > 8 post-op, Pltc WNL  No bleeding issues per nursing   Goal of Therapy:  INR 2-3 Heparin level 0.3-0.7 units/ml Monitor platelets by anticoagulation protocol: Yes   Plan:  Warfarin 3 mg PO x 1 today. Resume heparin infusion today ~2pm at 1200 units/hr Heparin level 8 hours after infusion resumed Daily heparin level, PT/INR and CBC Monitor closely for s/sx of bleeding   Lindell Spar, PharmD, BCPS Clinical Pharmacist [phone] 10/10/2018,10:01 AM

## 2018-10-10 NOTE — Progress Notes (Signed)
Heparin infusion dose 1,200 u/hr  12 ml/hr started @1427 . Confirmed with Steffanie Dunn, Therapist, sports.

## 2018-10-10 NOTE — Plan of Care (Signed)
  Problem: Health Behavior/Discharge Planning: Goal: Ability to manage health-related needs will improve Outcome: Progressing   Problem: Activity: Goal: Risk for activity intolerance will decrease Outcome: Progressing   Problem: Coping: Goal: Level of anxiety will decrease Outcome: Progressing   Problem: Pain Managment: Goal: General experience of comfort will improve Outcome: Progressing   

## 2018-10-10 NOTE — Progress Notes (Signed)
RT Note:  RN called me to Patient's room and stated patient needed a breathing treatment.  Upon entering  Patient room, patient sats were 100%, patient did not feel like she needed a treatment, patient did not have a wheeze.  Returned med, told patient to have RN call if she felt SOB or felt like she was wheezing.

## 2018-10-11 ENCOUNTER — Encounter (HOSPITAL_COMMUNITY): Payer: Self-pay | Admitting: *Deleted

## 2018-10-11 DIAGNOSIS — I482 Chronic atrial fibrillation, unspecified: Secondary | ICD-10-CM

## 2018-10-11 LAB — CBC
HCT: 27.4 % — ABNORMAL LOW (ref 36.0–46.0)
Hemoglobin: 7.8 g/dL — ABNORMAL LOW (ref 12.0–15.0)
MCH: 19.6 pg — ABNORMAL LOW (ref 26.0–34.0)
MCHC: 28.5 g/dL — ABNORMAL LOW (ref 30.0–36.0)
MCV: 69 fL — ABNORMAL LOW (ref 80.0–100.0)
Platelets: 219 10*3/uL (ref 150–400)
RBC: 3.97 MIL/uL (ref 3.87–5.11)
RDW: 24.7 % — ABNORMAL HIGH (ref 11.5–15.5)
WBC: 6.4 10*3/uL (ref 4.0–10.5)
nRBC: 0.3 % — ABNORMAL HIGH (ref 0.0–0.2)

## 2018-10-11 LAB — BASIC METABOLIC PANEL
Anion gap: 13 (ref 5–15)
BUN: 28 mg/dL — ABNORMAL HIGH (ref 8–23)
CO2: 31 mmol/L (ref 22–32)
Calcium: 8.3 mg/dL — ABNORMAL LOW (ref 8.9–10.3)
Chloride: 94 mmol/L — ABNORMAL LOW (ref 98–111)
Creatinine, Ser: 1.29 mg/dL — ABNORMAL HIGH (ref 0.44–1.00)
GFR calc Af Amer: 45 mL/min — ABNORMAL LOW (ref >60)
GFR calc non Af Amer: 39 mL/min — ABNORMAL LOW (ref >60)
Glucose, Bld: 117 mg/dL — ABNORMAL HIGH (ref 70–99)
Potassium: 3.6 mmol/L (ref 3.5–5.1)
Sodium: 138 mmol/L (ref 135–145)

## 2018-10-11 LAB — HEPARIN LEVEL (UNFRACTIONATED)
Heparin Unfractionated: <0.1 IU/mL — ABNORMAL LOW (ref 0.30–0.70)
Heparin Unfractionated: <0.1 IU/mL — ABNORMAL LOW (ref 0.30–0.70)

## 2018-10-11 LAB — MAGNESIUM: Magnesium: 2.2 mg/dL (ref 1.7–2.4)

## 2018-10-11 LAB — PROTIME-INR
INR: 2.1 — ABNORMAL HIGH (ref 0.8–1.2)
Prothrombin Time: 22.8 seconds — ABNORMAL HIGH (ref 11.4–15.2)

## 2018-10-11 MED ORDER — WARFARIN SODIUM 2 MG PO TABS
2.0000 mg | ORAL_TABLET | Freq: Once | ORAL | Status: AC
  Administered 2018-10-11: 2 mg via ORAL
  Filled 2018-10-11: qty 1

## 2018-10-11 MED ORDER — FUROSEMIDE 10 MG/ML IJ SOLN
40.0000 mg | Freq: Every day | INTRAMUSCULAR | Status: DC
  Administered 2018-10-12: 40 mg via INTRAVENOUS
  Filled 2018-10-11: qty 4

## 2018-10-11 NOTE — Progress Notes (Signed)
   Progress Note  Patient Name: [name] Date of Encounter: 10/11/2018  Primary Cardiologist: Virl Axe, MD  Brief follow-up note.  Please refer to postoperative cardiology follow-up note on Friday.  Orthopedics pleased with progress, she has postoperative anemia with follow-up hemoglobin planned for tomorrow.  Chart reviewed.  She is afebrile, remains in rate controlled atrial fibrillation with systolics in the 395K to 441N.  Net output incomplete.  Weight is stable 169 pounds.  Current cardiac medications include Zetia, Crestor, Coumadin per pharmacy, and Lasix dosed at 40 mg IV twice daily.  Creatinine has increased from 1.48-1.59.  Hemoglobin 8.0.  Current INR 1.6.  As an outpatient she is on Demadex 40 mg daily with Zaroxolyn 5 mg once a week.  Would reduce IV Lasix to 40 mg once daily for now.  Follow-up BMET in a.m.  Signed, Rozann Lesches, MD  10/11/2018, 9:58 AM

## 2018-10-11 NOTE — NC FL2 (Addendum)
[place] LEVEL OF CARE SCREENING TOOL     IDENTIFICATION  Patient Name: [name] Birthdate: 30-Jul-1933 Sex: female Admission Date (Current Location): 10/06/2018  [facility] and [place] Number:  Herbalist and Address:  [facility] [place], [place] [address] [place], [place] [address]      Provider Number: [id number]  Attending Physician Name and Address:  Aline August, MD  Relative Name and Phone Number:  Melinda Crutch   [phone]    Current Level of Care: Hospital Recommended Level of Care: [place] Prior Approval Number:    Date Approved/Denied:   PASRR Number: [id number]   Discharge Plan: SNF    Current Diagnoses: Patient Active Problem List   Diagnosis Date Noted  . Displaced fracture of left femoral neck ([place]) 10/09/2018  . Somnolence   . Closed left hip fracture ([place]) 10/07/2018  . Fall   . Hypokalemia   . Afib ([place]) 06/01/2018  . Urinary frequency 09/18/2017  . Iliac artery stenosis, bilateral ([place]) 07/14/2017  . Recurrent UTI 07/14/2017  . Long term (current) use of anticoagulants 05/08/2017  . Anemia 11/18/2016  . Dysuria 11/18/2016  . Pulmonary hypertension ([place]) 12/04/2015  . B12 deficiency 05/22/2015  . PAD (peripheral artery disease) ([place])   . Elevated serum creatinine   . Renal cell carcinoma ([place])   . S/p nephrectomy   . TIA (transient ischemic attack) 08/16/2014  . Weakness 01/14/2014  . Chronic kidney disease (CKD), stage IV (severe) ([place]) 12/08/2013  . Preoperative cardiovascular examination 06/07/2013  . Acute on chronic diastolic CHF (congestive heart failure) ([place]) 06/04/2013  . Mitral valve regurgitation 06/04/2013  . Allergic rhinitis 01/01/2013  . Chronic cholecystitis with calculus 09/15/2012  . Claudication ([place]) 01/21/2012  . Dizziness 11/26/2011  . Hypertension 05/20/2011  . Avascular necrosis of hip ([place]) 05/03/2011  . OTHER AND UNSPECIFIED COAGULATION  DEFECTS 10/13/2009  . RECTAL BLEEDING 10/13/2009  . HOCM / IHSS 07/14/2009  . EDEMA 07/14/2009  . Hyperlipidemia 04/10/2009  . ADJUSTMENT DISORDER WITH ANXIETY 04/10/2009  . INCONTINENCE, URGE 04/10/2009  . Atrial fibrillation ([place]) 10/27/2008  . SINUS BRADYCARDIA 10/27/2008  . Carotid artery stenosis 10/27/2008  . COPD (chronic obstructive pulmonary disease) ([place]) 10/27/2008  . Implantable cardioverter-defibrillator (ICD) in situ 10/27/2008    Orientation RESPIRATION BLADDER Height & Weight     Self, Time, Situation, Place  O2(nasal cannula 2(l/min)) Incontinent, External catheter Weight: 169 lb 8.5 oz (76.9 kg) Height:  5\' 6"  (167.6 cm)  BEHAVIORAL SYMPTOMS/MOOD NEUROLOGICAL BOWEL NUTRITION STATUS      Continent Diet(please see discahrge summary )  AMBULATORY STATUS COMMUNICATION OF NEEDS Skin     Verbally Surgical wounds(incsion closed, Hip, left; inclision closed abdomen)                       Personal Care Assistance Level of Assistance  Bathing, Feeding, Dressing Bathing Assistance: Limited assistance Feeding assistance: Independent Dressing Assistance: Limited assistance     Functional Limitations Info  Sight, Hearing, Speech Sight Info: Adequate Hearing Info: Adequate Speech Info: Adequate    SPECIAL CARE FACTORS FREQUENCY  PT (By licensed PT), OT (By licensed OT)     PT Frequency: 3x per week OT Frequency: 3x per week             Contractures Contractures Info: Not present    Additional Factors Info  Code Status, Allergies Code Status Info: FULL Allergies Info: Codeine, Morphine And Related, Sulfonamide Derivatives  Current Medications (10/11/2018):  This is the current hospital active medication list Current Facility-Administered Medications  Medication Dose Route Frequency Provider Last Rate Last Dose  . acetaminophen (TYLENOL) tablet 650 mg  650 mg Oral Q6H PRN Rod Can, MD   650 mg at 10/11/18 0939  . albuterol  (PROVENTIL) (2.5 MG/3ML) 0.083% nebulizer solution 2.5 mg  2.5 mg Nebulization Q6H PRN Swinteck, Aaron Edelman, MD      . bisacodyl (DULCOLAX) EC tablet 5 mg  5 mg Oral Daily PRN Swinteck, Aaron Edelman, MD      . docusate sodium (COLACE) capsule 100 mg  100 mg Oral BID Rod Can, MD   100 mg at 10/11/18 0940  . ezetimibe (ZETIA) tablet 10 mg  10 mg Oral Daily Rod Can, MD   10 mg at 10/11/18 0940  . fluticasone (FLONASE) 50 MCG/ACT nasal spray 2 spray  2 spray Each Nare Daily Swinteck, Aaron Edelman, MD      . Derrill Memo ON 10/12/2018] furosemide (LASIX) injection 40 mg  40 mg Intravenous Daily Satira Sark, MD      . heparin ADULT infusion 100 units/mL (25000 units/264mL sodium chloride 0.45%)  1,300 Units/hr Intravenous Continuous Aline August, MD 13 mL/hr at 10/11/18 0121 1,300 Units/hr at 10/11/18 0121  . ipratropium (ATROVENT) nebulizer solution 0.5 mg  0.5 mg Nebulization Q6H PRN Swinteck, Aaron Edelman, MD      . levothyroxine (SYNTHROID) tablet 25 mcg  25 mcg Oral [id number] Rod Can, MD   25 mcg at 10/11/18 0519  . loratadine (CLARITIN) tablet 10 mg  10 mg Oral Daily Rod Can, MD   10 mg at 10/11/18 0939  . magnesium citrate solution 1 Bottle  1 Bottle Oral Once PRN Rod Can, MD      . magnesium oxide (MAG-OX) tablet 400 mg  400 mg Oral Daily Rod Can, MD   400 mg at 10/11/18 0940  . menthol-cetylpyridinium (CEPACOL) lozenge 3 mg  1 lozenge Oral PRN Swinteck, Aaron Edelman, MD       Or  . phenol (CHLORASEPTIC) mouth spray 1 spray  1 spray Mouth/Throat PRN Swinteck, Aaron Edelman, MD      . metoCLOPramide (REGLAN) tablet 5-10 mg  5-10 mg Oral Q8H PRN Swinteck, Aaron Edelman, MD       Or  . metoCLOPramide (REGLAN) injection 5-10 mg  5-10 mg Intravenous Q8H PRN Swinteck, Aaron Edelman, MD      . naloxone [facility]) injection 0.4 mg  0.4 mg Intravenous PRN Rod Can, MD   0.4 mg at 10/07/18 1936  . ondansetron (ZOFRAN) tablet 4 mg  4 mg Oral Q6H PRN Swinteck, Aaron Edelman, MD       Or  . ondansetron (ZOFRAN) injection  4 mg  4 mg Intravenous Q6H PRN Swinteck, Aaron Edelman, MD      . rosuvastatin (CRESTOR) tablet 40 mg  40 mg Oral Daily Rod Can, MD   40 mg at 10/11/18 0939  . senna-docusate (Senokot-S) tablet 1 tablet  1 tablet Oral QHS PRN Swinteck, Aaron Edelman, MD      . traMADol Veatrice Bourbon) tablet 50 mg  50 mg Oral Q12H PRN Rod Can, MD      . Warfarin - Pharmacist Dosing Inpatient   Does not apply q1800 Skeet Simmer, [facility]         Discharge Medications: Please see discharge summary for a list of discharge medications.  Relevant Imaging Results:  Relevant Lab Results:   Additional Information SSN [id number]  Vinie Sill, LCSWA

## 2018-10-11 NOTE — Progress Notes (Signed)
Subjective: 2 Days Post-Op Procedure(s) (LRB): TOTAL HIP ARTHROPLASTY ANTERIOR APPROACH (Left) Patient reports pain as 1 on 0-10 scale. Doing very well this morning.HBG 8.0 and she is stable. Will repeat in A.M.   Objective: Vital signs in last 24 hours: Temp:  [98.4 F (36.9 C)-98.6 F (37 C)] 98.5 F (36.9 C) (06/07 0400) Pulse Rate:  [69-84] 74 (06/07 0400) Resp:  [14-17] 14 (06/07 0400) BP: (130-155)/(51-58) 130/58 (06/07 0400) SpO2:  [89 %-100 %] 100 % (06/07 0400)  Intake/Output from previous day: 06/06 0701 - 06/07 0700 In: 742.7 [P.O.:720; I.V.:22.7] Out: -  Intake/Output this shift: No intake/output data recorded.  Recent Labs    10/09/18 0850 10/09/18 1213 10/10/18 0243  HGB 11.6* 11.2* 8.0*   Recent Labs    10/09/18 1213 10/10/18 0243  WBC  --  8.0  RBC  --  4.08  HCT 33.0* 28.1*  PLT  --  216   Recent Labs    10/09/18 1643 10/10/18 0243  NA 137 137  K 3.7 3.8  CL 95* 98  CO2 29 28  BUN 24* 29*  CREATININE 1.48* 1.59*  GLUCOSE 160* 142*  CALCIUM 8.6* 8.4*   Recent Labs    10/08/18 1259 10/10/18 0243  INR 1.5* 1.6*    Neurovascular intact   Assessment/Plan: 2 Days Post-Op Procedure(s) (LRB): TOTAL HIP ARTHROPLASTY ANTERIOR APPROACH (Left) Up with therapy   Will repeat the HBg in the morning   Latanya Maudlin 10/11/2018, 9:04 AM

## 2018-10-11 NOTE — Progress Notes (Signed)
ANTICOAGULATION CONSULT NOTE - Pharmacy Consult for Heparin Indication: atrial fibrillation  Allergies  Allergen Reactions  . Codeine Nausea And Vomiting  . Morphine And Related Nausea And Vomiting  . Sulfonamide Derivatives Other (See Comments)    Dry mouth    Patient Measurements: Height: 5\' 6"  (167.6 cm) Weight: 169 lb 8.5 oz (76.9 kg) IBW/kg (Calculated) : 59.3 Heparin Dosing Weight: 75 kg  Vital Signs: Temp: 98.4 F (36.9 C) (06/06 1959) Temp Source: Oral (06/06 1959) BP: 155/55 (06/06 1959) Pulse Rate: 69 (06/06 1959)  Labs: Recent Labs    10/08/18 0600 10/08/18 1259 10/09/18 0850 10/09/18 1213 10/09/18 1643 10/10/18 0243 10/10/18 2144  HGB 7.5*  --  11.6* 11.2*  --  8.0*  --   HCT 28.3*  --  34.0* 33.0*  --  28.1*  --   PLT 275  --   --   --   --  216  --   LABPROT  --  18.3*  --   --   --  19.1*  --   INR  --  1.5*  --   --   --  1.6*  --   HEPARINUNFRC 0.47  --   --   --   --   --  <0.10*  CREATININE 1.52*  --   --   --  1.48* 1.59*  --     Estimated Creatinine Clearance: 28.6 mL/min (A) (by C-G formula based on SCr of 1.59 mg/dL (H)).  Assessment: 85 y.o. female with h/o Afib s/p THA 6/5 for heparin   Goal of Therapy:  INR 2-3 Heparin level 0.3-0.7 units/ml Monitor platelets by anticoagulation protocol: Yes   Plan:  Increase Heparin  1300 units/hr Follow-up am labs.   Phillis Knack, PharmD, BCPS  10/11/2018,1:03 AM

## 2018-10-11 NOTE — Progress Notes (Signed)
Orthopedic Tech Progress Note Patient Details:  [name] 30-Oct-1933 [id number]  Applied Over Head Frame with Trapeze for patient  Patient ID: [name], female   DOB: 31-Oct-1933, 85 y.o.   MRN: [id number]   [name] 10/11/2018, 11:07 AM

## 2018-10-11 NOTE — Progress Notes (Addendum)
ANTICOAGULATION CONSULT NOTE - Follow Up Consult  Pharmacy Consult for Warfarin and Heparin Indication: atrial fibrillation  Allergies  Allergen Reactions  . Codeine Nausea And Vomiting  . Morphine And Related Nausea And Vomiting  . Sulfonamide Derivatives Other (See Comments)    Dry mouth    Patient Measurements: Height: 5\' 6"  (167.6 cm) Weight: 169 lb 8.5 oz (76.9 kg) IBW/kg (Calculated) : 59.3 Heparin Dosing Weight: 75 kg  Vital Signs: Temp: 98.5 F (36.9 C) (06/07 0400) Temp Source: Oral (06/07 0400) BP: 130/58 (06/07 0400) Pulse Rate: 74 (06/07 0400)  Labs: Recent Labs    10/08/18 1259  10/09/18 0850 10/09/18 1213 10/09/18 1643 10/10/18 0243 10/10/18 2144  HGB  --    < > 11.6* 11.2*  --  8.0*  --   HCT  --   --  34.0* 33.0*  --  28.1*  --   PLT  --   --   --   --   --  216  --   LABPROT 18.3*  --   --   --   --  19.1*  --   INR 1.5*  --   --   --   --  1.6*  --   HEPARINUNFRC  --   --   --   --   --   --  <0.10*  CREATININE  --   --   --   --  1.48* 1.59*  --    < > = values in this interval not displayed.    Estimated Creatinine Clearance: 28.6 mL/min (A) (by C-G formula based on SCr of 1.59 mg/dL (H)).  Assessment:  85 year old female on warfarin PTA for atrial fibrillation. Admitted 6/2 after mechanical fall and left hip fracture. INR 1.8 on admit. Warfarin held and heparin infusion begun early am on 6/3. Heparin infusion held 6 hours prior to L THA. Post-operatively on 6/5, pharmacy consulted to resume warfarin that night and IV heparin to resume no sooner than 24 hours post-op. AET 6/5 at 1327. Last heparin level therapeutic (0.47) on 1200 units/hr.  Received 2 units PRBCs on 6/4.   PTA Warfarin dose: 2mg  daily except 1mg  on Sundays per patient. Last dose 6/1. Of note, per last coumadin clinic note on 09/24/18, dose was to have been increased to 2mg  every day, but patient says she was not instructed on this change.   Today, 10/11/18:  INR 2.1, in desired  range  Heparin infusion incr from 1200 > 1300 units/hr, Hep levels still low  Hgb decr post-op, 7.8, Plt wnl, po intake improved  No bleeding issues per nursing   Goal of Therapy:  INR 2-3 Heparin level 0.3-0.7 units/ml Monitor platelets by anticoagulation protocol: Yes   Plan:  Warfarin 2 mg PO x 1 today Discontinue Heparin infusion Daily Protime/INR Monitor closely for s/sx of bleeding  Minda Ditto PharmD Clinical Pharmacist [phone] 10/11/2018,8:32 AM

## 2018-10-11 NOTE — Progress Notes (Signed)
Patient ID: [name], female   DOB: 1934/05/04, 85 y.o.   MRN: [id number]  PROGRESS NOTE    [name]  OPF:[id number] DOB: February 13, 1934 DOA: 10/06/2018 PCP: Elby Beck, FNP   Brief Narrative:   85 year old female with history of chronic diastolic heart failure with pacemaker, atrial fibrillation on Coumadin, CKD, CAD, TIA, chronic anemia, recurrent urinary tract infection, remote history of renal cell cancer status post nephrectomy presented on 10/06/2018 with left hip pain status post mechanical fall.  She was found to have minimally displaced left femoral neck fracture.  Orthopedics was consulted.  Cardiology was also consulted given extensive cardiac history.  She was transferred to [facility] on 10/08/2018 for orthopedic intervention.  Assessment & Plan:   Active Problems:   Acute on chronic diastolic CHF (congestive heart failure) (HCC)   Preoperative cardiovascular examination   Anemia   Closed left hip fracture (HCC)   Fall   Hypokalemia   Somnolence   Displaced fracture of left femoral neck (HCC)  Left femoral neck fracture following mechanical fall -Orthopedics following.  Status post left total hip arthroplasty on 10/09/2018.  Follow further recommendations from orthopedics. -Fall precautions -PT/OT evaluation.  Might need rehab -Pain management  Acute respiratory failure with hypercarbia along with sleepiness and drowsiness -CT chest did not show any PE but showed bilateral moderate pleural effusion.  She was treated with aggressive diuresis -Subsequently patient became excessively drowsy/sleepy on the afternoon of 10/07/2018 as she was on Dilaudid for pain.  ABG showed hypercarbia.  Dilaudid was discontinued.  She responded very well to Narcan.  Subsequently respiratory status has improved.  Currently still on 2 L oxygen supplementation via nasal cannula.  Chest x-ray from 10/10/2018 showed persistent bilateral pleural effusions but decreasing edema.  -Monitor mental status.  Acute on chronic diastolic heart failure -Echo on 10/06/2018 showed EF of 60 to 65% with severe pulmonary hypertension -Strict input and output.  Daily weights.  Fluid restriction. -Cardiology following.  Diuretics as per cardiology team.  Chronic atrial fibrillation with history of pacemaker -On Coumadin at home.  Has been on heparin since admission.  Coumadin has been restarted.  Monitor INR. -Currently rate controlled.  Chronic disease history--monitor creatinine.  Baseline creatinine ranged from 1.2-1.6.repeat a.m. labs  Anemia of chronic disease from chronic kidney disease -Baseline hemoglobin is around 8. Status post 2 units packed red cell transfusion per orthopedics on 10/08/2018.  No labs this morning.  Monitor.  History of recent UTI -On Keflex at home.  Urine culture here did not show any growth.  Keflex has been discontinued.  Hyperlipidemia--continue Zetia, Crestor  History of recent weight loss -Apparently lost around 20 pounds over the last 6 months with decreased appetite.  Patient has history of renal cell cancer currently and depression, status post nephrectomy in 2003.  She will need outpatient follow-up with PCP regarding the same to rule out any new or recurrent malignancy.  DVT prophylaxis: Heparin drip/Coumadin Code Status: Full Family Communication: Spoke to daughter [name] on phone on 10/10/2018 Disposition Plan: Will need rehab versus CIR.  Consultants: Orthopedic/cardiology  Procedures:  Echo  IMPRESSIONS    1. The left ventricle has normal systolic function with an ejection fraction of 60-65%. The cavity size was normal. Left ventricular diastolic function could not be evaluated due to nondiagnostic images.  2. The right ventricle has normal systolic function. The cavity was mildly enlarged. There is moderately increased right ventricular wall thickness. Right ventricular systolic pressure is severely elevated  with an estimated  pressure of 93.2 mmHg.  3. Left atrial size was mildly dilated.  4. The mitral valve is degenerative. Mild thickening of the mitral valve leaflet.  5. The tricuspid valve is not well visualized. Tricuspid valve regurgitation is mild-moderate.  6. The aortic valve was not well visualized. Mild thickening of the aortic valve. Aortic valve regurgitation is trivial by color flow Doppler.  7. The inferior vena cava was dilated in size with <50% respiratory variability.  Antimicrobials:  Anti-infectives (From admission, onward)   Start     Dose/Rate Route Frequency Ordered Stop   10/09/18 1700  ceFAZolin (ANCEF) IVPB 2g/100 mL premix     2 g 200 mL/hr over 30 Minutes Intravenous Every 6 hours 10/09/18 1456 10/10/18 0025   10/09/18 0855  ceFAZolin (ANCEF) 2-4 GM/100ML-% IVPB    Note to Pharmacy:  Nyoka Cowden   : cabinet override      10/09/18 0855 10/09/18 2059   10/08/18 0600  ceFAZolin (ANCEF) IVPB 2g/100 mL premix  Status:  Discontinued     2 g 200 mL/hr over 30 Minutes Intravenous On call to O.R. 10/08/18 0248 10/09/18 0559   10/07/18 0300  cephALEXin (KEFLEX) capsule 500 mg  Status:  Discontinued     500 mg Oral Every 12 hours 10/07/18 0248 10/09/18 1453       Subjective: Patient seen and examined at bedside.  She denies any overnight fever, vomiting.  No worsening hip pain.  No worsening shortness of breath.  Poor historian. Objective: Vitals:   10/10/18 0824 10/10/18 1433 10/10/18 1959 10/11/18 0400  BP:  (!) 133/51 (!) 155/55 (!) 130/58  Pulse:  84 69 74  Resp:  16 17 14   Temp:  98.6 F (37 C) 98.4 F (36.9 C) 98.5 F (36.9 C)  TempSrc:  Oral Oral Oral  SpO2: 98% 96% (!) 89% 100%  Weight:      Height:        Intake/Output Summary (Last 24 hours) at 10/11/2018 0724 Last data filed at 10/10/2018 1800 Gross per 24 hour  Intake 742.72 ml  Output -  Net 742.72 ml   Filed Weights   10/06/18 2140 10/07/18 0323 10/09/18 0905  Weight: 78 kg 76.9 kg 76.9 kg     Examination:  General exam: Appears awake.  Poor historian.  No distress. Respiratory system: Bilateral decreased breath sounds at bases, scattered crackles mostly in the bases.   Cardiovascular system: S1-S2 heard, rate controlled Gastrointestinal system: Abdomen is nondistended, soft and nontender. Normal bowel sounds heard. Extremities: No cyanosis; trace edema   Data Reviewed: I have personally reviewed following labs and imaging studies  CBC: Recent Labs  Lab 10/06/18 2220 10/07/18 0539 10/08/18 0600 10/09/18 0850 10/09/18 1213 10/10/18 0243  WBC 7.4 5.6 8.0  --   --  8.0  NEUTROABS 6.6  --   --   --   --   --   HGB 8.0* 8.3* 7.5* 11.6* 11.2* 8.0*  HCT 29.6* 30.5* 28.3* 34.0* 33.0* 28.1*  MCV 65.9* 65.6* 67.4*  --   --  68.9*  PLT 299 260 275  --   --  542   Basic Metabolic Panel: Recent Labs  Lab 10/06/18 2220 10/07/18 0539 10/08/18 0600 10/09/18 0850 10/09/18 1213 10/09/18 1643 10/10/18 0243  NA 137 134* 134* 135 135 137 137  K 2.8* 3.9 3.5 3.5 3.8 3.7 3.8  CL 96* 95* 94*  --   --  95* 98  CO2 31 29  28  --   --  29 28  GLUCOSE 139* 154* 131* 105* 105* 160* 142*  BUN 17 16 27*  --   --  24* 29*  CREATININE 1.26* 1.20* 1.52*  --   --  1.48* 1.59*  CALCIUM 8.7* 7.9* 8.2*  --   --  8.6* 8.4*  MG 1.9  --   --   --   --   --  2.2   GFR: Estimated Creatinine Clearance: 28.6 mL/min (A) (by C-G formula based on SCr of 1.59 mg/dL (H)). Liver Function Tests: No results for input(s): AST, ALT, ALKPHOS, BILITOT, PROT, ALBUMIN in the last 168 hours. No results for input(s): LIPASE, AMYLASE in the last 168 hours. No results for input(s): AMMONIA in the last 168 hours. Coagulation Profile: Recent Labs  Lab 10/06/18 2220 10/08/18 1259 10/10/18 0243  INR 1.8* 1.5* 1.6*   Cardiac Enzymes: No results for input(s): CKTOTAL, CKMB, CKMBINDEX, TROPONINI in the last 168 hours. BNP (last 3 results) No results for input(s): PROBNP in the last 8760 hours. HbA1C: No  results for input(s): HGBA1C in the last 72 hours. CBG: Recent Labs  Lab 10/08/18 0510  GLUCAP 115*   Lipid Profile: No results for input(s): CHOL, HDL, LDLCALC, TRIG, CHOLHDL, LDLDIRECT in the last 72 hours. Thyroid Function Tests: No results for input(s): TSH, T4TOTAL, FREET4, T3FREE, THYROIDAB in the last 72 hours. Anemia Panel: No results for input(s): VITAMINB12, FOLATE, FERRITIN, TIBC, IRON, RETICCTPCT in the last 72 hours. Sepsis Labs: No results for input(s): PROCALCITON, LATICACIDVEN in the last 168 hours.  Recent Results (from the past 240 hour(s))  Urine Culture     Status: None   Collection Time: 10/05/18  3:57 PM  Result Value Ref Range Status   MICRO NUMBER: [id number]  Final   SPECIMEN QUALITY: Adequate  Final   Sample Source URINE  Final   STATUS: FINAL  Final   Result:   Final    Three or more organisms present, each greater than 10,000 CFU/mL. May represent normal flora contamination from external genitalia. No further testing is required.  SARS Coronavirus 2 (CEPHEID- Performed in [facility] hospital lab), Hosp Order     Status: None   Collection Time: 10/06/18 10:16 PM  Result Value Ref Range Status   SARS Coronavirus 2 NEGATIVE NEGATIVE Final    Comment: (NOTE) If result is NEGATIVE SARS-CoV-2 target nucleic acids are NOT DETECTED. The SARS-CoV-2 RNA is generally detectable in upper and lower  respiratory specimens during the acute phase of infection. The lowest  concentration of SARS-CoV-2 viral copies this assay can detect is 250  copies / mL. A negative result does not preclude SARS-CoV-2 infection  and should not be used as the sole basis for treatment or other  patient management decisions.  A negative result may occur with  improper specimen collection / handling, submission of specimen other  than nasopharyngeal swab, presence of viral mutation(s) within the  areas targeted by this assay, and inadequate number of viral copies  (<250 copies / mL). A  negative result must be combined with clinical  observations, patient history, and epidemiological information. If result is POSITIVE SARS-CoV-2 target nucleic acids are DETECTED. The SARS-CoV-2 RNA is generally detectable in upper and lower  respiratory specimens dur ing the acute phase of infection.  Positive  results are indicative of active infection with SARS-CoV-2.  Clinical  correlation with patient history and other diagnostic information is  necessary to determine patient infection status.  Positive results do  not rule out bacterial infection or co-infection with other viruses. If result is PRESUMPTIVE POSTIVE SARS-CoV-2 nucleic acids MAY BE PRESENT.   A presumptive positive result was obtained on the submitted specimen  and confirmed on repeat testing.  While 2019 novel coronavirus  (SARS-CoV-2) nucleic acids may be present in the submitted sample  additional confirmatory testing may be necessary for epidemiological  and / or clinical management purposes  to differentiate between  SARS-CoV-2 and other Sarbecovirus currently known to infect humans.  If clinically indicated additional testing with an alternate test  methodology [phone]) is advised. The SARS-CoV-2 RNA is generally  detectable in upper and lower respiratory sp ecimens during the acute  phase of infection. The expected result is Negative. Fact Sheet for Patients:  StrictlyIdeas.no Fact Sheet for Healthcare Providers: BankingDealers.co.za This test is not yet approved or cleared by the [place] FDA and has been authorized for detection and/or diagnosis of SARS-CoV-2 by FDA under an Emergency Use Authorization (EUA).  This EUA will remain in effect (meaning this test can be used) for the duration of the COVID-19 declaration under Section 564(b)(1) of the Act, 21 U.S.C. section 360bbb-3(b)(1), unless the authorization is terminated or revoked sooner. Performed  at [facility], [place] [address] [place], [place] [address]   Urine culture     Status: None   Collection Time: 10/07/18  1:20 AM  Result Value Ref Range Status   Specimen Description   Final    URINE, CATHETERIZED Performed at [facility] [address], [place], [place] [address]    Special Requests   Final    NONE Performed at [facility], [place] [address] [place], [place] [address]    Culture   Final    NO GROWTH Performed at [facility], [place] [address], [place], [place] [address]    Report Status 10/07/2018 FINAL  Final  Surgical PCR screen     Status: None   Collection Time: 10/07/18  3:11 AM  Result Value Ref Range Status   MRSA, PCR NEGATIVE NEGATIVE Final   Staphylococcus aureus NEGATIVE NEGATIVE Final    Comment: (NOTE) The Xpert SA Assay (FDA approved for NASAL specimens in patients 57 years of age and older), is one component of a comprehensive surveillance program. It is not intended to diagnose infection nor to guide or monitor treatment. Performed at [facility], [place] [address] [place],  [address]          Radiology Studies: Pelvis Portable  Result Date: 10/09/2018 CLINICAL DATA:  Status post left total hip placement. EXAM: PORTABLE PELVIS 1-2 VIEWS COMPARISON:  Radiographs of May 03, 2011. FINDINGS: There is been interval placement of left total hip arthroplasty. The acetabular and femoral components appear to be well situated. Expected postoperative changes are seen in the surrounding soft tissues. Previously noted right total hip arthroplasty is again seen. No fracture or dislocation is noted. IMPRESSION: Status post left total hip arthroplasty. Electronically Signed   By: Marijo Conception M.D.   On: 10/09/2018 15:11   Dg Chest Port 1 View  Result Date: 10/10/2018 CLINICAL DATA:  85 year old with dyspnea. EXAM: PORTABLE CHEST 1 VIEW COMPARISON:  10/06/2018 and  chest CT 10/07/2018 FINDINGS: Improved aeration in the lungs suggesting decreased airspace disease. There are bilateral pleural effusions that are small to moderate in size. Heart size is within normal limits and stable. Again noted is a cardiac ICD. Negative  for a pneumothorax. IMPRESSION: Improved aeration in lungs suggesting decreased airspace disease or edema. Persistent bilateral pleural effusions. Electronically Signed   By: Markus Daft M.D.   On: 10/10/2018 16:18   Dg C-arm 1-60 Min  Result Date: 10/09/2018 CLINICAL DATA:  Left hip replacement. EXAM: OPERATIVE LEFT HIP (WITH PELVIS IF PERFORMED) 2 VIEWS TECHNIQUE: Fluoroscopic spot image(s) were submitted for interpretation post-operatively. COMPARISON:  10/06/2018. FINDINGS: Interval left hip prosthesis in satisfactory position and alignment. No fracture or dislocation seen. Stable right hip prosthesis. IMPRESSION: Satisfactory postoperative appearance of a left hip prosthesis. Electronically Signed   By: Claudie Revering M.D.   On: 10/09/2018 13:19   Dg Hip Operative Unilat W Or W/o Pelvis Left  Result Date: 10/09/2018 CLINICAL DATA:  Left hip replacement. EXAM: OPERATIVE LEFT HIP (WITH PELVIS IF PERFORMED) 2 VIEWS TECHNIQUE: Fluoroscopic spot image(s) were submitted for interpretation post-operatively. COMPARISON:  10/06/2018. FINDINGS: Interval left hip prosthesis in satisfactory position and alignment. No fracture or dislocation seen. Stable right hip prosthesis. IMPRESSION: Satisfactory postoperative appearance of a left hip prosthesis. Electronically Signed   By: Claudie Revering M.D.   On: 10/09/2018 13:19        Scheduled Meds: . docusate sodium  100 mg Oral BID  . ezetimibe  10 mg Oral Daily  . fluticasone  2 spray Each Nare Daily  . furosemide  40 mg Intravenous BID  . levothyroxine  25 mcg Oral Q0600  . loratadine  10 mg Oral Daily  . magnesium oxide  400 mg Oral Daily  . rosuvastatin  40 mg Oral Daily  . Warfarin - Pharmacist Dosing  Inpatient   Does not apply q1800   Continuous Infusions: . heparin 1,300 Units/hr (10/11/18 0121)     LOS: 4 days        Aline August, MD [facility] 10/11/2018, 7:24 AM

## 2018-10-11 NOTE — TOC Initial Note (Signed)
Transition of Care [facility]) - Initial/Assessment Note    Patient Details  Name: [name] MRN: [id number] Date of Birth: 1933-06-08  Transition of Care [facility]) CM/SW Contact:    Bary Castilla, LCSW Phone Number:[phone] 10/11/2018, 10:44 AM  Clinical Narrative:      CSW met with patient bedside to discuss SNF options.Patient stated that she only wanted to get to [facility]. Pt's daughter called into room and CSW spoke to daughter and she reconfirmed that [facility] would be their only option considered. She informed CSW that if the referral was not accepted that she would like to bring her mother home with [facility]. CSW informed pt's daughter that she would fax referral out today for [facility].               Expected Discharge Plan: Skilled Nursing Facility Barriers to Discharge: SNF Pending discharge orders, SNF Pending bed offer   Patient Goals and CMS Choice Patient states their goals for this hospitalization and ongoing recovery are:: to go back home once better CMS Medicare.gov Compare Post Acute Care list provided to:: Patient Choice offered to / list presented to : Patient  Expected Discharge Plan and Services Expected Discharge Plan: [place]       Living arrangements for the past 2 months: Single Family Home                                      Prior Living Arrangements/Services Living arrangements for the past 2 months: Single Family Home Lives with:: Self Patient language and need for interpreter reviewed:: Yes Do you feel safe going back to the place where you live?: Yes      Need for Family Participation in Patient Care: Yes (Comment) Care giver support system in place?: Yes (comment)   Criminal Activity/Legal Involvement Pertinent to Current Situation/Hospitalization: No - Comment as needed  Activities of Daily Living Home Assistive Devices/Equipment: Blood pressure cuff, Dentures (specify type)(partial dentures) ADL Screening  (condition at time of admission) Patient's cognitive ability adequate to safely complete daily activities?: Yes Is the patient deaf or have difficulty hearing?: No Does the patient have difficulty seeing, even when wearing glasses/contacts?: No Does the patient have difficulty concentrating, remembering, or making decisions?: No Patient able to express need for assistance with ADLs?: Yes Does the patient have difficulty dressing or bathing?: No Independently performs ADLs?: Yes (appropriate for developmental age) Does the patient have difficulty walking or climbing stairs?: Yes Weakness of Legs: Both Weakness of Arms/Hands: None  Permission Sought/Granted Permission sought to share information with : Family Supports, Facility Contact Representative(Daughter [place]) Permission granted to share information with : Yes, Verbal Permission Granted  Share Information with NAME: [name] and [place] granted to share info w AGENCY: [place] granted to share info w Relationship: Daughters  Permission granted to share info w Contact Information: [name] [phone] and [name] [phone]  Emotional Assessment Appearance:: Appears older than stated age Attitude/Demeanor/Rapport: Engaged Affect (typically observed): Accepting, Calm Orientation: : Oriented to Self, Oriented to  Time, Oriented to Situation, Oriented to Place Alcohol / Substance Use: Never Used Psych Involvement: No (comment)  Admission diagnosis:  Shortness of breath [R06.02] Hypokalemia [E87.6] Fall [W19.XXXA] Closed fracture of left hip, initial encounter ([place]) [S72.002A] Anemia, unspecified type [D64.9] Patient Active Problem List   Diagnosis Date Noted  . Displaced fracture  of left femoral neck ([place]) 10/09/2018  . Somnolence   . Closed left hip fracture ([place]) 10/07/2018  . Fall   . Hypokalemia   . Afib ([place]) 06/01/2018  . Urinary frequency 09/18/2017  . Iliac artery stenosis, bilateral  ([place]) 07/14/2017  . Recurrent UTI 07/14/2017  . Long term (current) use of anticoagulants 05/08/2017  . Anemia 11/18/2016  . Dysuria 11/18/2016  . Pulmonary hypertension ([place]) 12/04/2015  . B12 deficiency 05/22/2015  . PAD (peripheral artery disease) ([place])   . Elevated serum creatinine   . Renal cell carcinoma ([place])   . S/p nephrectomy   . TIA (transient ischemic attack) 08/16/2014  . Weakness 01/14/2014  . Chronic kidney disease (CKD), stage IV (severe) ([place]) 12/08/2013  . Preoperative cardiovascular examination 06/07/2013  . Acute on chronic diastolic CHF (congestive heart failure) ([place]) 06/04/2013  . Mitral valve regurgitation 06/04/2013  . Allergic rhinitis 01/01/2013  . Chronic cholecystitis with calculus 09/15/2012  . Claudication ([place]) 01/21/2012  . Dizziness 11/26/2011  . Hypertension 05/20/2011  . Avascular necrosis of hip ([place]) 05/03/2011  . OTHER AND UNSPECIFIED COAGULATION DEFECTS 10/13/2009  . RECTAL BLEEDING 10/13/2009  . HOCM / IHSS 07/14/2009  . EDEMA 07/14/2009  . Hyperlipidemia 04/10/2009  . ADJUSTMENT DISORDER WITH ANXIETY 04/10/2009  . INCONTINENCE, URGE 04/10/2009  . Atrial fibrillation ([place]) 10/27/2008  . SINUS BRADYCARDIA 10/27/2008  . Carotid artery stenosis 10/27/2008  . COPD (chronic obstructive pulmonary disease) ([place]) 10/27/2008  . Implantable cardioverter-defibrillator (ICD) in situ 10/27/2008   PCP:  Elby Beck, FNP Pharmacy:   [facility], [place] [address] Phone: [phone] Fax: [phone]     Social Determinants of Health (SDOH) Interventions    Readmission Risk Interventions No flowsheet data found.

## 2018-10-11 NOTE — Progress Notes (Signed)
Pt's daughter called RN concerned about her mom's "blood levels being low." Apparently she was on the phone with the pt and the pt started falling asleep. Daughter states she does this at home as well sometimes. RN went to check on pt, pt stated she was just tired. Pt awoke when RN entered room, vitals taken and are normal. Pt on 3L O2. Will continue to monitor.

## 2018-10-11 NOTE — Progress Notes (Signed)
Physical Therapy Treatment Patient Details Name: [name] MRN: [id number] DOB: 1933-10-26 Today's Date: 10/11/2018    History of Present Illness Pt is an 85 y.o. female admitted 10/06/18 after fall sustaining L hip fx. S/p L THA on 10/09/18. PMH includes frequent falls, arthritis, CHF, CKD, CAD, afib, AICD, s/p R THA (2012)    PT Comments    Pt tolerated mobility better today, c/o very little dizziness. Ambulated 5' with RW and min A, 3x, with seated rest breaks. Needing min A for transfers. Performed seated there ex. Remained on 3L O2 for session with VSS.  PT will continue to follow.    Follow Up Recommendations  CIR     Equipment Recommendations  None recommended by PT    Recommendations for Other Services       Precautions / Restrictions Precautions Precautions: Fall Restrictions Weight Bearing Restrictions: Yes LLE Weight Bearing: Weight bearing as tolerated    Mobility  Bed Mobility               General bed mobility comments: pt received in chair  Transfers Overall transfer level: Needs assistance Equipment used: Rolling walker (2 wheeled) Transfers: Sit to/from Stand Sit to Stand: Min assist;Min guard         General transfer comment: min-guard A from recliner with arms. Min A from bed and toilet, for power up  Ambulation/Gait Ambulation/Gait assistance: Min assist Gait Distance (Feet): 15 Feet(5', 5', 5') Assistive device: Rolling walker (2 wheeled) Gait Pattern/deviations: Step-to pattern;Antalgic;Decreased weight shift to left;Decreased dorsiflexion - left Gait velocity: decreased Gait velocity interpretation: <1.31 ft/sec, indicative of household ambulator General Gait Details: wiggling L toes to move LLE fwd, vc's for picking LLE up and pt able to do so with low clearance.    Stairs             Wheelchair Mobility    Modified Rankin (Stroke Patients Only)       Balance Overall balance assessment: Needs assistance;History  of Falls Sitting-balance support: No upper extremity supported Sitting balance-Leahy Scale: Good     Standing balance support: Bilateral upper extremity supported Standing balance-Leahy Scale: Poor Standing balance comment: reliant on UE support                            Cognition Arousal/Alertness: Awake/alert Behavior During Therapy: WFL for tasks assessed/performed Overall Cognitive Status: Within Functional Limits for tasks assessed                                 General Comments: slow processing      Exercises Total Joint Exercises Ankle Circles/Pumps: AROM;Both;10 reps;Seated Quad Sets: AROM;Both;10 reps;Seated Gluteal Sets: AROM;Both;10 reps;Seated Hip ABduction/ADduction: AAROM;Left;5 reps;Seated Long Arc Quad: AROM;Seated;10 reps;Both    General Comments General comments (skin integrity, edema, etc.): SpO2 in 90's on 3L O2      Pertinent Vitals/Pain Pain Assessment: Faces Faces Pain Scale: Hurts little more Pain Location: L hip Pain Descriptors / Indicators: Sore Pain Intervention(s): Limited activity within patient's tolerance;Monitored during session    Home Living                      Prior Function            PT Goals (current goals can now be found in the care plan section) Acute Rehab PT Goals Patient Stated Goal: return home PT  Goal Formulation: With patient Time For Goal Achievement: 10/24/18 Potential to Achieve Goals: Good Progress towards PT goals: Progressing toward goals    Frequency    Min 5X/week      PT Plan Current plan remains appropriate    Co-evaluation              AM-PAC PT "6 Clicks" Mobility   Outcome Measure  Help needed turning from your back to your side while in a flat bed without using bedrails?: A Little Help needed moving from lying on your back to sitting on the side of a flat bed without using bedrails?: A Little Help needed moving to and from a bed to a chair  (including a wheelchair)?: A Little Help needed standing up from a chair using your arms (e.g., wheelchair or bedside chair)?: A Little Help needed to walk in hospital room?: A Little Help needed climbing 3-5 steps with a railing? : Total 6 Click Score: 16    End of Session Equipment Utilized During Treatment: Gait belt Activity Tolerance: Patient tolerated treatment well Patient left: in chair;with call bell/phone within reach;with chair alarm set Nurse Communication: Mobility status PT Visit Diagnosis: Unsteadiness on feet (R26.81);History of falling (Z91.81);Pain;Difficulty in walking, not elsewhere classified (R26.2) Pain - Right/Left: Left Pain - part of body: Leg;Hip     Time: 1450-1525 PT Time Calculation (min) (ACUTE ONLY): 35 min  Charges:  $Gait Training: 8-22 mins $Therapeutic Exercise: 8-22 mins                     [place]  Pager [phone] Office [place] 10/11/2018, 3:54 PM

## 2018-10-12 ENCOUNTER — Encounter (HOSPITAL_COMMUNITY): Payer: Self-pay | Admitting: Orthopedic Surgery

## 2018-10-12 DIAGNOSIS — Z95810 Presence of automatic (implantable) cardiac defibrillator: Secondary | ICD-10-CM | POA: Diagnosis not present

## 2018-10-12 DIAGNOSIS — R531 Weakness: Secondary | ICD-10-CM | POA: Diagnosis not present

## 2018-10-12 DIAGNOSIS — R52 Pain, unspecified: Secondary | ICD-10-CM | POA: Diagnosis not present

## 2018-10-12 DIAGNOSIS — R2689 Other abnormalities of gait and mobility: Secondary | ICD-10-CM | POA: Diagnosis not present

## 2018-10-12 DIAGNOSIS — J9602 Acute respiratory failure with hypercapnia: Secondary | ICD-10-CM | POA: Diagnosis not present

## 2018-10-12 DIAGNOSIS — I5033 Acute on chronic diastolic (congestive) heart failure: Secondary | ICD-10-CM | POA: Diagnosis not present

## 2018-10-12 DIAGNOSIS — R0602 Shortness of breath: Secondary | ICD-10-CM | POA: Diagnosis not present

## 2018-10-12 DIAGNOSIS — I5040 Unspecified combined systolic (congestive) and diastolic (congestive) heart failure: Secondary | ICD-10-CM | POA: Diagnosis not present

## 2018-10-12 DIAGNOSIS — M2550 Pain in unspecified joint: Secondary | ICD-10-CM | POA: Diagnosis not present

## 2018-10-12 DIAGNOSIS — I10 Essential (primary) hypertension: Secondary | ICD-10-CM | POA: Diagnosis not present

## 2018-10-12 DIAGNOSIS — E039 Hypothyroidism, unspecified: Secondary | ICD-10-CM | POA: Diagnosis not present

## 2018-10-12 DIAGNOSIS — R609 Edema, unspecified: Secondary | ICD-10-CM | POA: Diagnosis not present

## 2018-10-12 DIAGNOSIS — E785 Hyperlipidemia, unspecified: Secondary | ICD-10-CM | POA: Diagnosis not present

## 2018-10-12 DIAGNOSIS — R279 Unspecified lack of coordination: Secondary | ICD-10-CM | POA: Diagnosis not present

## 2018-10-12 DIAGNOSIS — Z4789 Encounter for other orthopedic aftercare: Secondary | ICD-10-CM | POA: Diagnosis not present

## 2018-10-12 DIAGNOSIS — S72002A Fracture of unspecified part of neck of left femur, initial encounter for closed fracture: Secondary | ICD-10-CM | POA: Diagnosis not present

## 2018-10-12 DIAGNOSIS — Z79899 Other long term (current) drug therapy: Secondary | ICD-10-CM | POA: Diagnosis not present

## 2018-10-12 DIAGNOSIS — I5032 Chronic diastolic (congestive) heart failure: Secondary | ICD-10-CM | POA: Diagnosis not present

## 2018-10-12 DIAGNOSIS — I4891 Unspecified atrial fibrillation: Secondary | ICD-10-CM | POA: Diagnosis not present

## 2018-10-12 DIAGNOSIS — B373 Candidiasis of vulva and vagina: Secondary | ICD-10-CM | POA: Diagnosis not present

## 2018-10-12 DIAGNOSIS — M6281 Muscle weakness (generalized): Secondary | ICD-10-CM | POA: Diagnosis not present

## 2018-10-12 DIAGNOSIS — I4819 Other persistent atrial fibrillation: Secondary | ICD-10-CM | POA: Diagnosis not present

## 2018-10-12 DIAGNOSIS — Z7401 Bed confinement status: Secondary | ICD-10-CM | POA: Diagnosis not present

## 2018-10-12 DIAGNOSIS — Z7901 Long term (current) use of anticoagulants: Secondary | ICD-10-CM | POA: Diagnosis not present

## 2018-10-12 DIAGNOSIS — S72002D Fracture of unspecified part of neck of left femur, subsequent encounter for closed fracture with routine healing: Secondary | ICD-10-CM | POA: Diagnosis not present

## 2018-10-12 DIAGNOSIS — N184 Chronic kidney disease, stage 4 (severe): Secondary | ICD-10-CM | POA: Diagnosis not present

## 2018-10-12 DIAGNOSIS — R41841 Cognitive communication deficit: Secondary | ICD-10-CM | POA: Diagnosis not present

## 2018-10-12 DIAGNOSIS — D649 Anemia, unspecified: Secondary | ICD-10-CM | POA: Diagnosis not present

## 2018-10-12 LAB — CBC WITH DIFFERENTIAL/PLATELET
Abs Immature Granulocytes: 0.02 10*3/uL (ref 0.00–0.07)
Basophils Absolute: 0 10*3/uL (ref 0.0–0.1)
Basophils Relative: 1 %
Eosinophils Absolute: 0.1 10*3/uL (ref 0.0–0.5)
Eosinophils Relative: 1 %
HCT: 28.2 % — ABNORMAL LOW (ref 36.0–46.0)
Hemoglobin: 7.8 g/dL — ABNORMAL LOW (ref 12.0–15.0)
Immature Granulocytes: 0 %
Lymphocytes Relative: 16 %
Lymphs Abs: 0.8 10*3/uL (ref 0.7–4.0)
MCH: 19.6 pg — ABNORMAL LOW (ref 26.0–34.0)
MCHC: 27.7 g/dL — ABNORMAL LOW (ref 30.0–36.0)
MCV: 70.9 fL — ABNORMAL LOW (ref 80.0–100.0)
Monocytes Absolute: 0.7 10*3/uL (ref 0.1–1.0)
Monocytes Relative: 14 %
Neutro Abs: 3.5 10*3/uL (ref 1.7–7.7)
Neutrophils Relative %: 68 %
Platelets: 230 10*3/uL (ref 150–400)
RBC: 3.98 MIL/uL (ref 3.87–5.11)
RDW: 25.2 % — ABNORMAL HIGH (ref 11.5–15.5)
WBC: 5.1 10*3/uL (ref 4.0–10.5)
nRBC: 0 % (ref 0.0–0.2)

## 2018-10-12 LAB — TYPE AND SCREEN
ABO/RH(D): A NEG
Antibody Screen: NEGATIVE
Unit division: 0
Unit division: 0
Unit division: 0

## 2018-10-12 LAB — BPAM RBC
Blood Product Expiration Date: 202006102359
Blood Product Expiration Date: 202006262359
Blood Product Expiration Date: 202006282359
ISSUE DATE / TIME: 202006050024
Unit Type and Rh: 600
Unit Type and Rh: 600
Unit Type and Rh: 600

## 2018-10-12 LAB — BASIC METABOLIC PANEL
Anion gap: 10 (ref 5–15)
BUN: 27 mg/dL — ABNORMAL HIGH (ref 8–23)
CO2: 32 mmol/L (ref 22–32)
Calcium: 8.4 mg/dL — ABNORMAL LOW (ref 8.9–10.3)
Chloride: 96 mmol/L — ABNORMAL LOW (ref 98–111)
Creatinine, Ser: 1.28 mg/dL — ABNORMAL HIGH (ref 0.44–1.00)
GFR calc Af Amer: 45 mL/min — ABNORMAL LOW (ref >60)
GFR calc non Af Amer: 39 mL/min — ABNORMAL LOW (ref >60)
Glucose, Bld: 100 mg/dL — ABNORMAL HIGH (ref 70–99)
Potassium: 3.9 mmol/L (ref 3.5–5.1)
Sodium: 138 mmol/L (ref 135–145)

## 2018-10-12 LAB — PROTIME-INR
INR: 2.1 — ABNORMAL HIGH (ref 0.8–1.2)
Prothrombin Time: 22.8 seconds — ABNORMAL HIGH (ref 11.4–15.2)

## 2018-10-12 LAB — MAGNESIUM: Magnesium: 2.2 mg/dL (ref 1.7–2.4)

## 2018-10-12 MED ORDER — WARFARIN SODIUM 2 MG PO TABS: 2.0000 mg | ORAL_TABLET | ORAL | Status: DC

## 2018-10-12 MED ORDER — ACETAMINOPHEN 325 MG PO TABS: 650.0000 mg | ORAL_TABLET | Freq: Four times a day (QID) | ORAL | Status: DC | PRN

## 2018-10-12 MED ORDER — DOCUSATE SODIUM 100 MG PO CAPS: 100.0000 mg | ORAL_CAPSULE | Freq: Two times a day (BID) | ORAL | 0 refills | Status: DC

## 2018-10-12 MED ORDER — TRAMADOL HCL 50 MG PO TABS: 50.0000 mg | ORAL_TABLET | Freq: Two times a day (BID) | ORAL | 0 refills | Status: DC | PRN

## 2018-10-12 MED ORDER — BISACODYL 5 MG PO TBEC: 5.0000 mg | DELAYED_RELEASE_TABLET | Freq: Every day | ORAL | 0 refills | Status: DC | PRN

## 2018-10-12 NOTE — Discharge Summary (Signed)
Physician Discharge Summary  QUENESHA DOUGLASS UEA:[id number] DOB: 1933/08/06 DOA: 10/06/2018  PCP: Elby Beck, FNP  Admit date: 10/06/2018 Discharge date: 10/12/2018  Admitted From: Home Disposition: SNF Recommendations for Outpatient Follow-up:  1. Follow up with SNF provider at earliest convenience with repeat CBC/BMP/INR in the next few days 2. Outpatient follow-up with orthopedics.  Weightbearing/wound care as per orthopedics recommendations 3. Outpatient follow-up with cardiology 4. Patient will benefit from outpatient evaluation and follow-up by palliative care team 5. Fall precautions 6. Follow up in ED if symptoms worsen or new appear   Home Health: No Equipment/Devices: Oxygen via nasal cannula 2 to 3 L/min  Discharge Condition: Guarded CODE STATUS: Full Diet recommendation: Heart healthy/fluid restriction up to 1500 cc a day  Brief/Interim Summary: 85 year old female with history of chronic diastolic heart failure with pacemaker, atrial fibrillation on Coumadin, CKD, CAD, TIA, chronic anemia, recurrent urinary tract infection, remote history of renal cell cancer status post nephrectomy presented on 10/06/2018 with left hip pain status post mechanical fall.  She was found to have minimally displaced left femoral neck fracture.  Orthopedics was consulted.  Cardiology was also consulted given extensive cardiac history.  She was transferred to [facility] on 10/08/2018 for orthopedic intervention.  She underwent left total hip arthroplasty on 10/09/2018.  Postprocedure, she has done well.  She has been getting intravenous Lasix as per cardiology recommendations.  PT recommended SNF placement.  She is hemodynamically stable.  She will be discharged to SNF once bed is available.  Discharge Diagnoses:  Active Problems:   Acute on chronic diastolic CHF (congestive heart failure) (HCC)   Preoperative cardiovascular examination   Anemia   Closed left hip fracture (HCC)   Fall    Hypokalemia   Somnolence   Displaced fracture of left femoral neck (HCC)  Acute respiratory failure with hypercarbia along with sleepiness and drowsiness -CT chest did not show any PE but showed bilateral moderate pleural effusion.  She was treated with aggressive diuresis -Subsequently patient became excessively drowsy/sleepy on the afternoon of 10/07/2018 as she was on Dilaudid for pain.  ABG showed hypercarbia.  Dilaudid was discontinued.  She responded very well to Narcan.  Subsequently respiratory status has improved.  Currently still on 2-3 L oxygen supplementation via nasal cannula.  Chest x-ray from 10/10/2018 showed persistent bilateral pleural effusions but decreasing edema. -Mental status has much improved and stable. -Patient will not be discharged on any opiate medications.  Please avoid any sort of opiate medications given in the rehab facility.  Acute on chronic diastolic heart failure -Echo on 10/06/2018 showed EF of 60 to 65% with severe pulmonary hypertension -Treated with intravenous Lasix.  Has had good diuresis.  Cardiology following and recommend switching back to her home regimen of torsemide 40 mg daily and metolazone 5 mg weekly.  Outpatient follow-up with cardiology.  Monitor creatinine as well as an outpatient.  Chronic atrial fibrillation with history of pacemaker -On Coumadin at home.    Was on heparin drip and subsequently switched to Coumadin as well.  Discharge on Coumadin.  Monitor INR at the rehab facility.    Chronic disease history--monitor creatinine.  Baseline creatinine ranged from 1.2-1.6.creatinine stable.  Please monitor creatinine as an outpatient.  Anemia of chronic disease from chronic kidney disease -Baseline hemoglobin is around 8. Status post 2 units packed red cell transfusion per orthopedics on 10/08/2018.   -Hemoglobin 7.8 this morning. Stable. -Patient will need repeat CBC within the next 5 to 7 days.  History of recent UTI -On Keflex at home.   Urine culture here did not show any growth.  Keflex has been discontinued.  Hyperlipidemia--continue Zetia, Crestor  History of recent weight loss -Apparently lost around 20 pounds over the last 6 months with decreased appetite.  Patient has history of renal cell cancer currently and depression, status post nephrectomy in 2003.  She will need outpatient follow-up with PCP regarding the same to rule out any new or recurrent malignancy.  Generalized deconditioning -Patient's overall health has been gradually declining.  She is currently listed as full code.  I discussed CODE STATUS with patient's daughter [name] on phone on 10/11/2018 and recommended considering changing her CODE STATUS to DNR.  Recommend outpatient palliative care evaluation and follow-up.  Patient currently remains full code but has guarded to poor prognosis.  Discharge Instructions  Discharge Instructions    (HEART FAILURE PATIENTS) Call MD:  Anytime you have any of the following symptoms: 1) 3 pound weight gain in 24 hours or 5 pounds in 1 week 2) shortness of breath, with or without a dry hacking cough 3) swelling in the hands, feet or stomach 4) if you have to sleep on extra pillows at night in order to breathe.   Complete by:  As directed    Ambulatory referral to Cardiology   Complete by:  As directed    CHF followup   Call MD for:  difficulty breathing, headache or visual disturbances   Complete by:  As directed    Call MD for:  extreme fatigue   Complete by:  As directed    Call MD for:  redness, tenderness, or signs of infection (pain, swelling, redness, odor or green/yellow discharge around incision site)   Complete by:  As directed    Call MD for:  temperature >100.4   Complete by:  As directed    Diet - low sodium heart healthy   Complete by:  As directed    Increase activity slowly   Complete by:  As directed      Allergies as of 10/12/2018      Reactions   Codeine Nausea And Vomiting   Morphine And  Related Nausea And Vomiting   Sulfonamide Derivatives Other (See Comments)   Dry mouth      Medication List    STOP taking these medications   cephALEXin 500 MG capsule Commonly known as:  KEFLEX     TAKE these medications   acetaminophen 325 MG tablet Commonly known as:  TYLENOL Take 2 tablets (650 mg total) by mouth every 6 (six) hours as needed for mild pain or moderate pain (or Fever >/= 101). What changed:    medication strength  how much to take  when to take this  reasons to take this   bisacodyl 5 MG EC tablet Commonly known as:  DULCOLAX Take 1 tablet (5 mg total) by mouth daily as needed for moderate constipation.   cetirizine 10 MG tablet Commonly known as:  ZYRTEC Take 10 mg by mouth daily.   conjugated estrogens vaginal cream Commonly known as:  Premarin Place 1 Applicatorful vaginally daily.   CRANBERRY PO Take 4 tablets by mouth daily.   docusate sodium 100 MG capsule Commonly known as:  COLACE Take 1 capsule (100 mg total) by mouth 2 (two) times daily.   ezetimibe 10 MG tablet Commonly known as:  ZETIA Take 1 tablet (10 mg total) by mouth daily.   fluticasone 50 MCG/ACT nasal spray Commonly known  as:  FLONASE Place 2 sprays into both nostrils daily.   hydrocortisone 2.5 % cream Apply 1 application topically daily as needed (itching).   levothyroxine 25 MCG tablet Commonly known as:  SYNTHROID Take 1 tablet (25 mcg total) by mouth daily before breakfast.   magnesium oxide 400 MG tablet Commonly known as:  MAG-OX Take 1 tablet (400 mg total) by mouth daily.   metolazone 5 MG tablet Commonly known as:  ZAROXOLYN Take 1 tablet (5 mg total) once a week. What changed:    how much to take  how to take this  when to take this  additional instructions   potassium chloride SA 20 MEQ tablet Commonly known as:  K-DUR Take 1 tablet (20 meq) by mouth twice daily, except on metolazone days take 2 tablets (40 meq) twice daily    rosuvastatin 40 MG tablet Commonly known as:  CRESTOR Take 1 tablet (40 mg total) by mouth daily.   torsemide 20 MG tablet Commonly known as:  DEMADEX Take 2 tablets (40 mg) by mouth once daily.   traMADol 50 MG tablet Commonly known as:  ULTRAM Take 1 tablet (50 mg total) by mouth every 12 (twelve) hours as needed for severe pain.   warfarin 2 MG tablet Commonly known as:  COUMADIN Take as directed. If you are unsure how to take this medication, talk to your nurse or doctor. Original instructions:  Take 1 tablet (2 mg total) by mouth as directed. Take 2 mg on Monday- Saturday and 1 Mg on Sunday      Follow-up Information    Swinteck, Aaron Edelman, MD. Schedule an appointment as soon as possible for a visit in 2 weeks.   Specialty:  Orthopedic Surgery Why:  For wound re-check, For suture removal Contact information: [address] [place] [address] [phone]        Elby Beck, FNP. Schedule an appointment as soon as possible for a visit in 1 week(s).   Specialties:  Nurse Practitioner, Family Medicine Why:  with repeat cbc/bmp/inr Contact information: [place] [address] [phone]        Deboraha Sprang, MD .   Specialty:  Cardiology Contact information: [phone] [address] [place] [address] [phone]        palliative care. Schedule an appointment as soon as possible for a visit in 1 week(s).          Allergies  Allergen Reactions  . Codeine Nausea And Vomiting  . Morphine And Related Nausea And Vomiting  . Sulfonamide Derivatives Other (See Comments)    Dry mouth    Consultations:  Orthopedic/cardiology   Procedures/Studies: Dg Chest 1 View  Result Date: 10/06/2018 CLINICAL DATA:  Hip fracture EXAM: CHEST  1 VIEW COMPARISON:  05/12/2018 FINDINGS: Unchanged position of left chest wall AICD leads. Mild cardiomegaly with hazy lower lobe predominant airspace opacities, possibly mild  pulmonary edema. Limited assessment of left lung base due to positioning., but there is some degree of retrocardiac opacity. IMPRESSION: Mild cardiomegaly and mild pulmonary edema. Limited assessment of left lung base due to positioning, but some degree of retrocardiac opacity. Lateral view would be helpful for further evaluation if necessary. Electronically Signed   By: Ulyses Jarred M.D.   On: 10/06/2018 22:20   Ct Angio Chest Pe W Or Wo Contrast  Result Date: 10/07/2018 CLINICAL DATA:  Hypoxia and elevated D-dimer. EXAM: CT ANGIOGRAPHY CHEST WITH CONTRAST TECHNIQUE: Multidetector CT imaging  of the chest was performed using the standard protocol during bolus administration of intravenous contrast. Multiplanar CT image reconstructions and MIPs were obtained to evaluate the vascular anatomy. CONTRAST:  27mL OMNIPAQUE IOHEXOL 350 MG/ML SOLN COMPARISON:  Chest x-ray from yesterday.  Chest CT 06/07/2010 FINDINGS: Cardiovascular: Cardiomegaly without pericardial effusion. Biventricular pacer from the left with 2 right ventricular ICD leads. Negative for pulmonary artery filling defect. Mediastinum/Nodes: Negative for mass or adenopathy Lungs/Pleura: Moderate layering pleural effusions on both sides. There is tracheobronchomalacia with generalized intermittently seen airway collapse. Dependent atelectasis. Ground-glass opacity asymmetric to the left lung with interlobular septal thickening. There are a few small subpleural nodules. Negative for lung mass. Upper Abdomen: Intrahepatic venous reflux. Musculoskeletal: No acute or aggressive finding Review of the MIP images confirms the above findings. IMPRESSION: 1. Cardiomegaly with moderate bilateral layering pleural effusion. 2. Ground-glass opacity in the left more than right lung, likely edema. 3. Negative for pulmonary embolism. Electronically Signed   By: Monte Fantasia M.D.   On: 10/07/2018 04:38   Pelvis Portable  Result Date: 10/09/2018 CLINICAL DATA:   Status post left total hip placement. EXAM: PORTABLE PELVIS 1-2 VIEWS COMPARISON:  Radiographs of May 03, 2011. FINDINGS: There is been interval placement of left total hip arthroplasty. The acetabular and femoral components appear to be well situated. Expected postoperative changes are seen in the surrounding soft tissues. Previously noted right total hip arthroplasty is again seen. No fracture or dislocation is noted. IMPRESSION: Status post left total hip arthroplasty. Electronically Signed   By: Marijo Conception M.D.   On: 10/09/2018 15:11   Dg Chest Port 1 View  Result Date: 10/10/2018 CLINICAL DATA:  85 year old with dyspnea. EXAM: PORTABLE CHEST 1 VIEW COMPARISON:  10/06/2018 and chest CT 10/07/2018 FINDINGS: Improved aeration in the lungs suggesting decreased airspace disease. There are bilateral pleural effusions that are small to moderate in size. Heart size is within normal limits and stable. Again noted is a cardiac ICD. Negative for a pneumothorax. IMPRESSION: Improved aeration in lungs suggesting decreased airspace disease or edema. Persistent bilateral pleural effusions. Electronically Signed   By: Markus Daft M.D.   On: 10/10/2018 16:18   Dg C-arm 1-60 Min  Result Date: 10/09/2018 CLINICAL DATA:  Left hip replacement. EXAM: OPERATIVE LEFT HIP (WITH PELVIS IF PERFORMED) 2 VIEWS TECHNIQUE: Fluoroscopic spot image(s) were submitted for interpretation post-operatively. COMPARISON:  10/06/2018. FINDINGS: Interval left hip prosthesis in satisfactory position and alignment. No fracture or dislocation seen. Stable right hip prosthesis. IMPRESSION: Satisfactory postoperative appearance of a left hip prosthesis. Electronically Signed   By: Claudie Revering M.D.   On: 10/09/2018 13:19   Dg Hip Operative Unilat W Or W/o Pelvis Left  Result Date: 10/09/2018 CLINICAL DATA:  Left hip replacement. EXAM: OPERATIVE LEFT HIP (WITH PELVIS IF PERFORMED) 2 VIEWS TECHNIQUE: Fluoroscopic spot image(s) were submitted  for interpretation post-operatively. COMPARISON:  10/06/2018. FINDINGS: Interval left hip prosthesis in satisfactory position and alignment. No fracture or dislocation seen. Stable right hip prosthesis. IMPRESSION: Satisfactory postoperative appearance of a left hip prosthesis. Electronically Signed   By: Claudie Revering M.D.   On: 10/09/2018 13:19   Dg Hip Unilat With Pelvis 2-3 Views Left  Result Date: 10/06/2018 CLINICAL DATA:  Fall EXAM: DG HIP (WITH OR WITHOUT PELVIS) 2-3V LEFT COMPARISON:  None. FINDINGS: Minimally displaced fracture of the left femoral neck with foreshortening. Femoral head remains situated in the acetabulum. Normal appearance of right total hip arthroplasty. No pelvic ring fracture. IMPRESSION: Minimally displaced  left femoral neck fracture with foreshortening. Electronically Signed   By: Ulyses Jarred M.D.   On: 10/06/2018 22:18   Vas [place] Lower Extremity Venous (dvt)  Result Date: 10/07/2018  Lower Venous Study Indications: Swelling.  Limitations: Body habitus, poor ultrasound/tissue interface and poor patient compliance secondary to pain. Performing Technologist: Maudry Mayhew MHA, RDMS, RVT, RDCS  Examination Guidelines: A complete evaluation includes B-mode imaging, spectral Doppler, color Doppler, and power Doppler as needed of all accessible portions of each vessel. Bilateral testing is considered an integral part of a complete examination. Limited examinations for reoccurring indications may be performed as noted.  +---------+---------------+---------+-----------+----------+--------------+ RIGHT    CompressibilityPhasicitySpontaneityPropertiesSummary        +---------+---------------+---------+-----------+----------+--------------+ CFV      Full           No       Yes                  pulsatile flow +---------+---------------+---------+-----------+----------+--------------+ SFJ      Full                                                         +---------+---------------+---------+-----------+----------+--------------+ FV Prox  Full                                                        +---------+---------------+---------+-----------+----------+--------------+ FV Mid   Full                                                        +---------+---------------+---------+-----------+----------+--------------+ FV DistalFull                                                        +---------+---------------+---------+-----------+----------+--------------+ PFV      Full                                                        +---------+---------------+---------+-----------+----------+--------------+ POP      Full           No       Yes                  pulsatile flow +---------+---------------+---------+-----------+----------+--------------+ PTV      Full                                                        +---------+---------------+---------+-----------+----------+--------------+ PERO     Full                                                        +---------+---------------+---------+-----------+----------+--------------+  +---------+---------------+---------+-----------+----------+-------------------+  LEFT     CompressibilityPhasicitySpontaneityPropertiesSummary             +---------+---------------+---------+-----------+----------+-------------------+ CFV      Full           No       Yes                  pulsatile flow      +---------+---------------+---------+-----------+----------+-------------------+ SFJ      Full                                                             +---------+---------------+---------+-----------+----------+-------------------+ FV Prox  Full                                                             +---------+---------------+---------+-----------+----------+-------------------+ FV Mid   Full                                                              +---------+---------------+---------+-----------+----------+-------------------+ FV DistalFull                                                             +---------+---------------+---------+-----------+----------+-------------------+ PFV      Full                                                             +---------+---------------+---------+-----------+----------+-------------------+ POP      Full           No       Yes                  pulsatile flow      +---------+---------------+---------+-----------+----------+-------------------+ PTV                              Yes                  patent by color                                                           Doppler- patient  unable to tolerate                                                        compression                                                               maneuvers           +---------+---------------+---------+-----------+----------+-------------------+ PERO                             Yes                  patent by color                                                           Doppler- patient                                                          unable to tolerate                                                        compression                                                               maneuvers           +---------+---------------+---------+-----------+----------+-------------------+  Summary: Right: There is no evidence of deep vein thrombosis in the lower extremity. No cystic structure found in the popliteal fossa. Left: There is no evidence of deep vein thrombosis in the lower extremity. However, portions of this examination were limited- see technologist comments above. No cystic structure found in the popliteal fossa.  *See table(s) above for measurements and observations.  Electronically signed by Curt Jews MD on 10/07/2018 at 2:37:31 PM.    Final     Echo  IMPRESSIONS   1. The left ventricle has normal systolic function with an ejection fraction of 60-65%. The cavity size was normal. Left ventricular diastolic function could not be evaluated due to nondiagnostic images. 2. The right ventricle has normal systolic function. The cavity was mildly enlarged. There is moderately increased right ventricular wall thickness. Right ventricular systolic pressure is severely elevated with an estimated pressure of 93.2 mmHg. 3. Left atrial size was mildly dilated. 4. The mitral valve is degenerative.  Mild thickening of the mitral valve leaflet. 5. The tricuspid valve is not well visualized. Tricuspid valve regurgitation is mild-moderate. 6. The aortic valve was not well visualized. Mild thickening of the aortic valve. Aortic valve regurgitation is trivial by color flow Doppler. 7. The inferior vena cava was dilated in size with <50% respiratory variability.   Subjective: Patient seen and examined at bedside.  She denies any overnight fever, nausea, vomiting or worsening shortness of breath or chest pain.  Discharge Exam: Vitals:   10/11/18 2006 10/12/18 0500  BP: (!) 134/52 (!) 133/49  Pulse: 60 74  Resp: 17   Temp: 98.9 F (37.2 C) 97.8 F (36.6 C)  SpO2: 100% 100%    General: Pt is alert, awake, not in acute distress Cardiovascular: rate controlled, S1/S2 + Respiratory: bilateral decreased breath sounds at bases with scattered basilar crackles Abdominal: Soft, NT, ND, bowel sounds + Extremities: Trace edema, no cyanosis    The results of significant diagnostics from this hospitalization (including imaging, microbiology, ancillary and laboratory) are listed below for reference.     Microbiology: Recent Results (from the past 240 hour(s))  Urine Culture     Status: None   Collection Time: 10/05/18  3:57 PM  Result Value Ref Range Status    MICRO NUMBER: [id number]  Final   SPECIMEN QUALITY: Adequate  Final   Sample Source URINE  Final   STATUS: FINAL  Final   Result:   Final    Three or more organisms present, each greater than 10,000 CFU/mL. May represent normal flora contamination from external genitalia. No further testing is required.  SARS Coronavirus 2 (CEPHEID- Performed in [facility] hospital lab), Hosp Order     Status: None   Collection Time: 10/06/18 10:16 PM  Result Value Ref Range Status   SARS Coronavirus 2 NEGATIVE NEGATIVE Final    Comment: (NOTE) If result is NEGATIVE SARS-CoV-2 target nucleic acids are NOT DETECTED. The SARS-CoV-2 RNA is generally detectable in upper and lower  respiratory specimens during the acute phase of infection. The lowest  concentration of SARS-CoV-2 viral copies this assay can detect is 250  copies / mL. A negative result does not preclude SARS-CoV-2 infection  and should not be used as the sole basis for treatment or other  patient management decisions.  A negative result may occur with  improper specimen collection / handling, submission of specimen other  than nasopharyngeal swab, presence of viral mutation(s) within the  areas targeted by this assay, and inadequate number of viral copies  (<250 copies / mL). A negative result must be combined with clinical  observations, patient history, and epidemiological information. If result is POSITIVE SARS-CoV-2 target nucleic acids are DETECTED. The SARS-CoV-2 RNA is generally detectable in upper and lower  respiratory specimens dur ing the acute phase of infection.  Positive  results are indicative of active infection with SARS-CoV-2.  Clinical  correlation with patient history and other diagnostic information is  necessary to determine patient infection status.  Positive results do  not rule out bacterial infection or co-infection with other viruses. If result is PRESUMPTIVE POSTIVE SARS-CoV-2 nucleic acids MAY BE PRESENT.   A  presumptive positive result was obtained on the submitted specimen  and confirmed on repeat testing.  While 2019 novel coronavirus  (SARS-CoV-2) nucleic acids may be present in the submitted sample  additional confirmatory testing may be necessary for epidemiological  and / or clinical management purposes  to differentiate between  SARS-CoV-2 and other Sarbecovirus  currently known to infect humans.  If clinically indicated additional testing with an alternate test  methodology [phone]) is advised. The SARS-CoV-2 RNA is generally  detectable in upper and lower respiratory sp ecimens during the acute  phase of infection. The expected result is Negative. Fact Sheet for Patients:  StrictlyIdeas.no Fact Sheet for Healthcare Providers: BankingDealers.co.za This test is not yet approved or cleared by the [place] FDA and has been authorized for detection and/or diagnosis of SARS-CoV-2 by FDA under an Emergency Use Authorization (EUA).  This EUA will remain in effect (meaning this test can be used) for the duration of the COVID-19 declaration under Section 564(b)(1) of the Act, 21 U.S.C. section 360bbb-3(b)(1), unless the authorization is terminated or revoked sooner. Performed at [facility], [place] [address] [place], [place] [address]   Urine culture     Status: None   Collection Time: 10/07/18  1:20 AM  Result Value Ref Range Status   Specimen Description   Final    URINE, CATHETERIZED Performed at [place] [address], [place], [place] [address]    Special Requests   Final    NONE Performed at [facility], [place] [address] [place], [place] [address]    Culture   Final    NO GROWTH Performed at [facility], [place] [address], [place], [place] [address]    Report Status 10/07/2018 FINAL  Final  Surgical PCR screen     Status: None   Collection Time: 10/07/18   3:11 AM  Result Value Ref Range Status   MRSA, PCR NEGATIVE NEGATIVE Final   Staphylococcus aureus NEGATIVE NEGATIVE Final    Comment: (NOTE) The Xpert SA Assay (FDA approved for NASAL specimens in patients 60 years of age and older), is one component of a comprehensive surveillance program. It is not intended to diagnose infection nor to guide or monitor treatment. Performed at [facility], [place] [address] [place], [place] [address]      Labs: BNP (last 3 results) Recent Labs    10/06/18 2220  BNP 638.9*   Basic Metabolic Panel: Recent Labs  Lab 10/06/18 2220  10/08/18 0600  10/09/18 1213 10/09/18 1643 10/10/18 0243 10/11/18 1046 10/12/18 0432  NA 137   < > 134*   < > 135 137 137 138 138  K 2.8*   < > 3.5   < > 3.8 3.7 3.8 3.6 3.9  CL 96*   < > 94*  --   --  95* 98 94* 96*  CO2 31   < > 28  --   --  29 28 31  32  GLUCOSE 139*   < > 131*   < > 105* 160* 142* 117* 100*  BUN 17   < > 27*  --   --  24* 29* 28* 27*  CREATININE 1.26*   < > 1.52*  --   --  1.48* 1.59* 1.29* 1.28*  CALCIUM 8.7*   < > 8.2*  --   --  8.6* 8.4* 8.3* 8.4*  MG 1.9  --   --   --   --   --  2.2 2.2 2.2   < > = values in this interval not displayed.   Liver Function Tests: No results for input(s): AST, ALT, ALKPHOS, BILITOT, PROT, ALBUMIN in the last 168 hours. No results for input(s): LIPASE, AMYLASE in the last 168 hours. No results for input(s): AMMONIA in the last  168 hours. CBC: Recent Labs  Lab 10/06/18 2220 10/07/18 0539 10/08/18 0600 10/09/18 0850 10/09/18 1213 10/10/18 0243 10/11/18 1046 10/12/18 0819  WBC 7.4 5.6 8.0  --   --  8.0 6.4 5.1  NEUTROABS 6.6  --   --   --   --   --   --  PENDING  HGB 8.0* 8.3* 7.5* 11.6* 11.2* 8.0* 7.8* 7.8*  HCT 29.6* 30.5* 28.3* 34.0* 33.0* 28.1* 27.4* 28.2*  MCV 65.9* 65.6* 67.4*  --   --  68.9* 69.0* 70.9*  PLT 299 260 275  --   --  216 219 230   Cardiac Enzymes: No results for input(s): CKTOTAL, CKMB, CKMBINDEX, TROPONINI  in the last 168 hours. BNP: Invalid input(s): POCBNP CBG: Recent Labs  Lab 10/08/18 0510  GLUCAP 115*   D-Dimer No results for input(s): DDIMER in the last 72 hours. Hgb A1c No results for input(s): HGBA1C in the last 72 hours. Lipid Profile No results for input(s): CHOL, HDL, LDLCALC, TRIG, CHOLHDL, LDLDIRECT in the last 72 hours. Thyroid function studies No results for input(s): TSH, T4TOTAL, T3FREE, THYROIDAB in the last 72 hours.  Invalid input(s): FREET3 Anemia work up No results for input(s): VITAMINB12, FOLATE, FERRITIN, TIBC, IRON, RETICCTPCT in the last 72 hours. Urinalysis    Component Value Date/Time   COLORURINE YELLOW 10/07/2018 0120   APPEARANCEUR CLEAR 10/07/2018 0120   APPEARANCEUR Cloudy (A) 01/27/2018 1330   LABSPEC 1.008 10/07/2018 0120   PHURINE 6.0 10/07/2018 0120   GLUCOSEU NEGATIVE 10/07/2018 0120   HGBUR NEGATIVE 10/07/2018 0120   HGBUR large 11/16/2009 1202   BILIRUBINUR NEGATIVE 10/07/2018 0120   BILIRUBINUR negative 10/05/2018 1435   BILIRUBINUR Negative 01/27/2018 1330   KETONESUR NEGATIVE 10/07/2018 0120   PROTEINUR NEGATIVE 10/07/2018 0120   UROBILINOGEN 0.2 10/05/2018 1435   UROBILINOGEN 1.0 08/16/2014 1754   NITRITE NEGATIVE 10/07/2018 0120   LEUKOCYTESUR NEGATIVE 10/07/2018 0120   Sepsis Labs Invalid input(s): PROCALCITONIN,  WBC,  LACTICIDVEN Microbiology Recent Results (from the past 240 hour(s))  Urine Culture     Status: None   Collection Time: 10/05/18  3:57 PM  Result Value Ref Range Status   MICRO NUMBER: [id number]  Final   SPECIMEN QUALITY: Adequate  Final   Sample Source URINE  Final   STATUS: FINAL  Final   Result:   Final    Three or more organisms present, each greater than 10,000 CFU/mL. May represent normal flora contamination from external genitalia. No further testing is required.  SARS Coronavirus 2 (CEPHEID- Performed in [facility]), Hosp Order     Status: None   Collection Time: 10/06/18 10:16 PM   Result Value Ref Range Status   SARS Coronavirus 2 NEGATIVE NEGATIVE Final    Comment: (NOTE) If result is NEGATIVE SARS-CoV-2 target nucleic acids are NOT DETECTED. The SARS-CoV-2 RNA is generally detectable in upper and lower  respiratory specimens during the acute phase of infection. The lowest  concentration of SARS-CoV-2 viral copies this assay can detect is 250  copies / mL. A negative result does not preclude SARS-CoV-2 infection  and should not be used as the sole basis for treatment or other  patient management decisions.  A negative result may occur with  improper specimen collection / handling, submission of specimen other  than nasopharyngeal swab, presence of viral mutation(s) within the  areas targeted by this assay, and inadequate number of viral copies  (<250 copies / mL). A negative result must be combined  with clinical  observations, patient history, and epidemiological information. If result is POSITIVE SARS-CoV-2 target nucleic acids are DETECTED. The SARS-CoV-2 RNA is generally detectable in upper and lower  respiratory specimens dur ing the acute phase of infection.  Positive  results are indicative of active infection with SARS-CoV-2.  Clinical  correlation with patient history and other diagnostic information is  necessary to determine patient infection status.  Positive results do  not rule out bacterial infection or co-infection with other viruses. If result is PRESUMPTIVE POSTIVE SARS-CoV-2 nucleic acids MAY BE PRESENT.   A presumptive positive result was obtained on the submitted specimen  and confirmed on repeat testing.  While 2019 novel coronavirus  (SARS-CoV-2) nucleic acids may be present in the submitted sample  additional confirmatory testing may be necessary for epidemiological  and / or clinical management purposes  to differentiate between  SARS-CoV-2 and other Sarbecovirus currently known to infect humans.  If clinically indicated additional  testing with an alternate test  methodology [phone]) is advised. The SARS-CoV-2 RNA is generally  detectable in upper and lower respiratory sp ecimens during the acute  phase of infection. The expected result is Negative. Fact Sheet for Patients:  StrictlyIdeas.no Fact Sheet for Healthcare Providers: BankingDealers.co.za This test is not yet approved or cleared by the [place] FDA and has been authorized for detection and/or diagnosis of SARS-CoV-2 by FDA under an Emergency Use Authorization (EUA).  This EUA will remain in effect (meaning this test can be used) for the duration of the COVID-19 declaration under Section 564(b)(1) of the Act, 21 U.S.C. section 360bbb-3(b)(1), unless the authorization is terminated or revoked sooner. Performed at [facility], [place] [address], [place], [place] [address]   Urine culture     Status: None   Collection Time: 10/07/18  1:20 AM  Result Value Ref Range Status   Specimen Description   Final    URINE, CATHETERIZED Performed at [place] [address], [place], [place] [address]    Special Requests   Final    NONE Performed at [facility], [place] [address], [place], [place] [address]    Culture   Final    NO GROWTH Performed at [facility], [place] [address], [place], [place] [address]    Report Status 10/07/2018 FINAL  Final  Surgical PCR screen     Status: None   Collection Time: 10/07/18  3:11 AM  Result Value Ref Range Status   MRSA, PCR NEGATIVE NEGATIVE Final   Staphylococcus aureus NEGATIVE NEGATIVE Final    Comment: (NOTE) The Xpert SA Assay (FDA approved for NASAL specimens in patients 45 years of age and older), is one component of a comprehensive surveillance program. It is not intended to diagnose infection nor to guide or monitor treatment. Performed at [facility], [place] [address], [place], [place] [address]      Time coordinating discharge: 35 minutes  SIGNED:   Aline August, MD  [facility] 10/12/2018, 9:31 AM

## 2018-10-12 NOTE — Progress Notes (Signed)
RN called Isaias Cowman and gave report to [place], Therapist, sports. Pt belongings gathered and awaiting PTAR IV has been removed.

## 2018-10-12 NOTE — Progress Notes (Signed)
Physical Therapy Treatment Patient Details Name: [name] MRN: [id number] DOB: 04/05/1934 Today's Date: 10/12/2018    History of Present Illness Pt is an 85 y.o. female admitted 10/06/18 after fall sustaining L hip fx. S/p L THA on 10/09/18. PMH includes frequent falls, arthritis, CHF, CKD, CAD, afib, AICD, s/p R THA (2012)    PT Comments    Pt performed gait training and functional mobility.  ON arrival patient finishing up on commode.  Pt require min guard for transfers and min assistance to progress gait training.  Pt family is adamant for SNF placement at [facility] only.  This venue is appropriate based on progress and will inform supervising PT of change in recommendations and frequency at this time.  Pt should d/c today to SNF.      Follow Up Recommendations  SNF     Equipment Recommendations  None recommended by PT    Recommendations for Other Services       Precautions / Restrictions Precautions Precautions: Fall Restrictions Weight Bearing Restrictions: Yes LLE Weight Bearing: Weight bearing as tolerated    Mobility  Bed Mobility               General bed mobility comments: Pt seated on BSC and transferring into standing with +1 assistance from nursing.    Transfers Overall transfer level: Needs assistance Equipment used: Rolling walker (2 wheeled) Transfers: Sit to/from Stand Sit to Stand: Min guard         General transfer comment: Pt stood with nursing and required min guard with cues for hand placement to return to seated surface.    Ambulation/Gait Ambulation/Gait assistance: Min assist Gait Distance (Feet): 35 Feet Assistive device: Rolling walker (2 wheeled) Gait Pattern/deviations: Step-to pattern;Antalgic;Decreased dorsiflexion - left;Step-through pattern;Decreased weight shift to right Gait velocity: decreased   General Gait Details: Pt intially wiggly toe on LLE to scoot forward, with cueing for R weight shifting she was able to  progress to step through apttern with good foot clearance.     Stairs             Wheelchair Mobility    Modified Rankin (Stroke Patients Only)       Balance Overall balance assessment: Needs assistance;History of Falls Sitting-balance support: No upper extremity supported Sitting balance-Leahy Scale: Good       Standing balance-Leahy Scale: Poor Standing balance comment: reliant on UE support                            Cognition Arousal/Alertness: Awake/alert Behavior During Therapy: WFL for tasks assessed/performed Overall Cognitive Status: Within Functional Limits for tasks assessed                                 General Comments: slow processing      Exercises      General Comments        Pertinent Vitals/Pain Pain Assessment: Faces Faces Pain Scale: Hurts little more Pain Location: L hip Pain Descriptors / Indicators: Sore Pain Intervention(s): Monitored during session;Repositioned    Home Living                      Prior Function            PT Goals (current goals can now be found in the care plan section) Acute Rehab PT Goals Patient Stated Goal: To  go to [facility] today Potential to Achieve Goals: Good Progress towards PT goals: Progressing toward goals    Frequency    Min 3X/week      PT Plan Discharge plan needs to be updated    Co-evaluation              AM-PAC PT "6 Clicks" Mobility   Outcome Measure  Help needed turning from your back to your side while in a flat bed without using bedrails?: A Little Help needed moving from lying on your back to sitting on the side of a flat bed without using bedrails?: A Little Help needed moving to and from a bed to a chair (including a wheelchair)?: A Little Help needed standing up from a chair using your arms (e.g., wheelchair or bedside chair)?: A Little Help needed to walk in hospital room?: A Little Help needed climbing 3-5 steps with a  railing? : A Lot 6 Click Score: 17    End of Session Equipment Utilized During Treatment: Gait belt Activity Tolerance: Patient tolerated treatment well Patient left: in chair;with call bell/phone within reach;with chair alarm set Nurse Communication: Mobility status PT Visit Diagnosis: Unsteadiness on feet (R26.81);History of falling (Z91.81);Pain;Difficulty in walking, not elsewhere classified (R26.2) Pain - Right/Left: Left Pain - part of body: Leg;Hip     Time: 6681-5947 PT Time Calculation (min) (ACUTE ONLY): 32 min  Charges:  $Gait Training: 8-22 mins $Therapeutic Activity: 8-22 mins                     Governor Rooks, PTA Acute Rehabilitation Services Pager [phone] Office [phone]     [name] 10/12/2018, 11:07 AM

## 2018-10-12 NOTE — Progress Notes (Signed)
[facility]:  PT is scheduled for follow up virtual visit on:   June 17 at 2:00pm with Dr. Rockey Situ.  This is a virtual visit - please call the office for consent.   She will need an INR in 2 days at the SNF - managed by her PCP.    Ledora Bottcher, PA-C 10/12/2018, 11:21 AM [place] [address] [place], [place] [address]

## 2018-10-12 NOTE — Care Management Important Message (Signed)
Important Message  Patient Details  Name: [name] MRN: [id number] Date of Birth: 1934-04-26   Medicare Important Message Given:  Yes    Memory Argue 10/12/2018, 4:30 PM

## 2018-10-12 NOTE — Progress Notes (Signed)
Inpatient Rehabilitation Admissions Coordinator  Inpatient Rehab Consult received. I met with patient at the bedside for rehabilitation assessment. Patient lacks the medical neccesity for an inpt rehab admission. We recommend SNF. Pt is in agreement. We will sign off at this time.  Danne Baxter, RN, MSN Rehab Admissions Coordinator [phone] 10/12/2018 11:39 AM

## 2018-10-12 NOTE — Progress Notes (Signed)
    Subjective:  Patient reports pain as mild to moderate.  Denies N/V/CP/SOB.   Objective:   VITALS:   Vitals:   10/11/18 1448 10/11/18 1858 10/11/18 2006 10/12/18 0500  BP: (!) 128/50 125/73 (!) 134/52 (!) 133/49  Pulse: 64 60 60 74  Resp:   17   Temp: 98.2 F (36.8 C)  98.9 F (37.2 C) 97.8 F (36.6 C)  TempSrc: Oral  Oral Oral  SpO2: 100%  100% 100%  Weight:      Height:        NAD ABD soft Sensation intact distally Intact pulses distally Dorsiflexion/Plantar flexion intact Incision: dressing C/D/I Compartment soft   Lab Results  Component Value Date   WBC 5.1 10/12/2018   HGB 7.8 (L) 10/12/2018   HCT 28.2 (L) 10/12/2018   MCV 70.9 (L) 10/12/2018   PLT 230 10/12/2018   BMET    Component Value Date/Time   NA 138 10/12/2018 0432   NA 143 05/19/2018 1332   K 3.9 10/12/2018 0432   CL 96 (L) 10/12/2018 0432   CO2 32 10/12/2018 0432   GLUCOSE 100 (H) 10/12/2018 0432   BUN 27 (H) 10/12/2018 0432   BUN 15 05/19/2018 1332   CREATININE 1.28 (H) 10/12/2018 0432   CREATININE 1.34 (H) 03/19/2011 0908   CALCIUM 8.4 (L) 10/12/2018 0432   GFRNONAA 39 (L) 10/12/2018 0432   GFRAA 45 (L) 10/12/2018 0432   INR 2.1  Assessment/Plan: 3 Days Post-Op   Active Problems:   Acute on chronic diastolic CHF (congestive heart failure) (HCC)   Preoperative cardiovascular examination   Anemia   Closed left hip fracture (HCC)   Fall   Hypokalemia   Somnolence   Displaced fracture of left femoral neck (HCC)   WBAT with walker DVT ppx: Coumadin, SCDs, TEDS PO pain control PT/OT Dispo: ready for d/c   [name] [name] 10/12/2018, 1:08 PM   [name] Can, MD Cell: [phone] [name] is now [facility]  [facility] [address], [place] [address], [place], [place] [address] Phone: [phone] [email or web address] Facebook  [employer]

## 2018-10-12 NOTE — Discharge Instructions (Addendum)
° °Dr. Brian Swinteck °Joint Replacement Specialist °[facility] °[address] °[place],  [address] °[phone] ° ° °TOTAL HIP REPLACEMENT POSTOPERATIVE DIRECTIONS ° ° ° °Hip Rehabilitation, Guidelines Following Surgery  ° °WEIGHT BEARING °Weight bearing as tolerated with assist device (walker, cane, etc) as directed, use it as long as suggested by your surgeon or therapist, typically at least 4-6 weeks. ° °The results of a hip operation are greatly improved after range of motion and muscle strengthening exercises. Follow all safety measures which are given to protect your hip. If any of these exercises cause increased pain or swelling in your joint, decrease the amount until you are comfortable again. Then slowly increase the exercises. Call your caregiver if you have problems or questions.  ° °HOME CARE INSTRUCTIONS  °Most of the following instructions are designed to prevent the dislocation of your new hip.  °Remove items at home which could result in a fall. This includes throw rugs or furniture in walking pathways.  °Continue medications as instructed at time of discharge. °· You may have some home medications which will be placed on hold until you complete the course of blood thinner medication. °· You may start showering once you are discharged home. Do not remove your dressing. °Do not put on socks or shoes without following the instructions of your caregivers.   °Sit on chairs with arms. Use the chair arms to help push yourself up when arising.  °Arrange for the use of a toilet seat elevator so you are not sitting low.  °· Walk with walker as instructed.  °You may resume a sexual relationship in one month or when given the OK by your caregiver.  °Use walker as long as suggested by your caregivers.  °You may put full weight on your legs and walk as much as is comfortable. °Avoid periods of inactivity such as sitting longer than an hour when not asleep. This helps prevent  blood clots.  °You may return to work once you are cleared by your surgeon.  °Do not drive a car for 6 weeks or until released by your surgeon.  °Do not drive while taking narcotics.  °Wear elastic stockings for two weeks following surgery during the day but you may remove then at night.  °Make sure you keep all of your appointments after your operation with all of your doctors and caregivers. You should call the office at the above phone number and make an appointment for approximately two weeks after the date of your surgery. °Please pick up a stool softener and laxative for home use as long as you are requiring pain medications. °· ICE to the affected hip every three hours for 30 minutes at a time and then as needed for pain and swelling. Continue to use ice on the hip for pain and swelling from surgery. You may notice swelling that will progress down to the foot and ankle.  This is normal after surgery.  Elevate the leg when you are not up walking on it.   °It is important for you to complete the blood thinner medication as prescribed by your doctor. °· Continue to use the breathing machine which will help keep your temperature down.  It is common for your temperature to cycle up and down following surgery, especially at night when you are not up moving around and exerting yourself.  The breathing machine keeps your lungs expanded and your temperature down. ° °RANGE OF MOTION AND STRENGTHENING EXERCISES  °These exercises   are designed to help you keep full movement of your hip joint. Follow your caregiver's or physical therapist's instructions. Perform all exercises about fifteen times, three times per day or as directed. Exercise both hips, even if you have had only one joint replacement. These exercises can be done on a training (exercise) mat, on the floor, on a table or on a bed. Use whatever works the best and is most comfortable for you. Use music or television while you are exercising so that the exercises  are a pleasant break in your day. This will make your life better with the exercises acting as a break in routine you can look forward to.  Lying on your back, slowly slide your foot toward your buttocks, raising your knee up off the floor. Then slowly slide your foot back down until your leg is straight again.  Lying on your back spread your legs as far apart as you can without causing discomfort.  Lying on your side, raise your upper leg and foot straight up from the floor as far as is comfortable. Slowly lower the leg and repeat.  Lying on your back, tighten up the muscle in the front of your thigh (quadriceps muscles). You can do this by keeping your leg straight and trying to raise your heel off the floor. This helps strengthen the largest muscle supporting your knee.  Lying on your back, tighten up the muscles of your buttocks both with the legs straight and with the knee bent at a comfortable angle while keeping your heel on the floor.   SKILLED REHAB INSTRUCTIONS: If the patient is transferred to a skilled rehab facility following release from the hospital, a list of the current medications will be sent to the facility for the patient to continue.  When discharged from the skilled rehab facility, please have the facility set up the patient's [place] prior to being released. Also, the skilled facility will be responsible for providing the patient with their medications at time of release from the facility to include their pain medication and their blood thinner medication. If the patient is still at the rehab facility at time of the two week follow up appointment, the skilled rehab facility will also need to assist the patient in arranging follow up appointment in our office and any transportation needs.  MAKE SURE YOU:  Understand these instructions.  Will watch your condition.  Will get help right away if you are not doing well or get worse.  Pick up stool softner and  laxative for home use following surgery while on pain medications. Do not remove your dressing. The dressing is waterproof--it is OK to take showers. Continue to use ice for pain and swelling after surgery. Do not use any lotions or creams on the incision until instructed by your surgeon. Total Hip Protocol.   _________________________________________________________________________________________________________________  [facility]  YOUR CARDIOLOGY TEAM HAS ARRANGED FOR AN E-VISIT FOR YOUR APPOINTMENT - PLEASE REVIEW IMPORTANT INFORMATION BELOW SEVERAL DAYS PRIOR TO YOUR APPOINTMENT  Due to the recent COVID-19 pandemic, we are transitioning in-person office visits to tele-medicine visits in an effort to decrease unnecessary exposure to our patients, their families, and staff. These visits are billed to your insurance just like a normal visit is. We also encourage you to sign up for MyChart if you have not already done so. You will need a smartphone if possible. For patients that do not have this, we can still complete the visit using a regular  telephone but do prefer a smartphone to enable video when possible. You may have a family member that lives with you that can help. If possible, we also ask that you have a blood pressure cuff and scale at home to measure your blood pressure, heart rate and weight prior to your scheduled appointment. Patients with clinical needs that need an in-person evaluation and testing will still be able to come to the office if absolutely necessary. If you have any questions, feel free to call our office.   2-3 DAYS BEFORE YOUR APPOINTMENT  You will receive a telephone call from one of our [place] team members - your caller ID may say "Unknown caller." If this is a video visit, we will walk you through how to get the video launched on your phone. We will remind you check your blood pressure, heart rate and weight prior to your scheduled appointment. If you have  an Apple Watch or Kardia, please upload any pertinent ECG strips the day before or morning of your appointment to [facility]. Our staff will also make sure you have reviewed the consent and agree to move forward with your scheduled tele-health visit.     THE DAY OF YOUR APPOINTMENT  Approximately 15 minutes prior to your scheduled appointment, you will receive a telephone call from one of [place] team - your caller ID may say "Unknown caller."  Our staff will confirm medications, vital signs for the day and any symptoms you may be experiencing. Please have this information available prior to the time of visit start. It may also be helpful for you to have a pad of paper and pen handy for any instructions given during your visit. They will also walk you through joining the smartphone meeting if this is a video visit.    CONSENT FOR TELE-HEALTH VISIT - PLEASE REVIEW  I hereby voluntarily request, consent and authorize [facility] and its employed or contracted physicians, physician assistants, nurse practitioners or other licensed health care professionals (the Practitioner), to provide me with telemedicine health care services (the Services") as deemed necessary by the treating Practitioner. I acknowledge and consent to receive the Services by the Practitioner via telemedicine. I understand that the telemedicine visit will involve communicating with the Practitioner through live audiovisual communication technology and the disclosure of certain medical information by electronic transmission. I acknowledge that I have been given the opportunity to request an in-person assessment or other available alternative prior to the telemedicine visit and am voluntarily participating in the telemedicine visit.  I understand that I have the right to withhold or withdraw my consent to the use of telemedicine in the course of my care at any time, without affecting my right to future care or treatment, and that the  Practitioner or I may terminate the telemedicine visit at any time. I understand that I have the right to inspect all information obtained and/or recorded in the course of the telemedicine visit and may receive copies of available information for a reasonable fee.  I understand that some of the potential risks of receiving the Services via telemedicine include:   Delay or interruption in medical evaluation due to technological equipment failure or disruption;  Information transmitted may not be sufficient (e.g. poor resolution of images) to allow for appropriate medical decision making by the Practitioner; and/or   In rare instances, security protocols could fail, causing a breach of personal health information.  Furthermore, I acknowledge that it is my responsibility to provide information about my  medical history, conditions and care that is complete and accurate to the best of my ability. I acknowledge that Practitioner's advice, recommendations, and/or decision may be based on factors not within their control, such as incomplete or inaccurate data provided by me or distortions of diagnostic images or specimens that may result from electronic transmissions. I understand that the practice of medicine is not an exact science and that Practitioner makes no warranties or guarantees regarding treatment outcomes. I acknowledge that I will receive a copy of this consent concurrently upon execution via email to the email address I last provided but may also request a printed copy by calling the office of [facility].    I understand that my insurance will be billed for this visit.   I have read or had this consent read to me.  I understand the contents of this consent, which adequately explains the benefits and risks of the Services being provided via telemedicine.   I have been provided ample opportunity to ask questions regarding this consent and the Services and have had my questions answered to my  satisfaction.  I give my informed consent for the services to be provided through the use of telemedicine in my medical care  By participating in this telemedicine visit I agree to the above.  ____________________________________________________________________________________________________________   Patient will need INR in 2 days - send to PCP. She will need INR follow up once discharged from SNF.

## 2018-10-12 NOTE — Progress Notes (Signed)
[name] has made bed offer. CSW initiated insurance auth with [employer], anticipate auth by end of day for discharge to [facility] today.   [place], [place]

## 2018-10-12 NOTE — Progress Notes (Signed)
Progress Note  Patient Name: [name] Date of Encounter: 10/12/2018  Primary Cardiologist: Virl Axe, MD   Subjective   Pt has no complaints today, but does appear pale. Right hand tremor seems to have largely resolved.    Inpatient Medications    Scheduled Meds: . docusate sodium  100 mg Oral BID  . ezetimibe  10 mg Oral Daily  . fluticasone  2 spray Each Nare Daily  . furosemide  40 mg Intravenous Daily  . levothyroxine  25 mcg Oral Q0600  . loratadine  10 mg Oral Daily  . magnesium oxide  400 mg Oral Daily  . rosuvastatin  40 mg Oral Daily  . Warfarin - Pharmacist Dosing Inpatient   Does not apply q1800   Continuous Infusions:  PRN Meds: acetaminophen **OR** [DISCONTINUED] acetaminophen, albuterol, bisacodyl, ipratropium, magnesium citrate, menthol-cetylpyridinium **OR** phenol, metoCLOPramide **OR** metoCLOPramide (REGLAN) injection, naLOXone (NARCAN)  injection, ondansetron **OR** ondansetron (ZOFRAN) IV, senna-docusate, traMADol   Vital Signs    Vitals:   10/11/18 1448 10/11/18 1858 10/11/18 2006 10/12/18 0500  BP: (!) 128/50 125/73 (!) 134/52 (!) 133/49  Pulse: 64 60 60 74  Resp:   17   Temp: 98.2 F (36.8 C)  98.9 F (37.2 C) 97.8 F (36.6 C)  TempSrc: Oral  Oral Oral  SpO2: 100%  100% 100%  Weight:      Height:        Intake/Output Summary (Last 24 hours) at 10/12/2018 0744 Last data filed at 10/11/2018 1450 Gross per 24 hour  Intake 480 ml  Output 350 ml  Net 130 ml   Last 3 Weights 10/09/2018 10/07/2018 10/06/2018  Weight (lbs) 169 lb 8.5 oz 169 lb 8.5 oz 172 lb  Weight (kg) 76.9 kg 76.9 kg 78.019 kg      Telemetry    N/A - Personally Reviewed  ECG    No new tracings - Personally Reviewed  Physical Exam   GEN: No acute distress.   Neck: + JVD Cardiac: irregular rhythm, regular rate, + systolic murmur Respiratory: occasional wheeze in upper lung fields, occasional crackle in bases GI: Soft, nontender, non-distended  MS: minimal  edema; No deformity. Neuro:  Nonfocal  Psych: Normal affect   Labs    Chemistry Recent Labs  Lab 10/10/18 0243 10/11/18 1046 10/12/18 0432  NA 137 138 138  K 3.8 3.6 3.9  CL 98 94* 96*  CO2 28 31 32  GLUCOSE 142* 117* 100*  BUN 29* 28* 27*  CREATININE 1.59* 1.29* 1.28*  CALCIUM 8.4* 8.3* 8.4*  GFRNONAA 30* 39* 39*  GFRAA 35* 45* 45*  ANIONGAP 11 13 10      Hematology Recent Labs  Lab 10/08/18 0600  10/09/18 1213 10/10/18 0243 10/11/18 1046  WBC 8.0  --   --  8.0 6.4  RBC 4.20  --   --  4.08 3.97  HGB 7.5*   < > 11.2* 8.0* 7.8*  HCT 28.3*   < > 33.0* 28.1* 27.4*  MCV 67.4*  --   --  68.9* 69.0*  MCH 17.9*  --   --  19.6* 19.6*  MCHC 26.5*  --   --  28.5* 28.5*  RDW 20.9*  --   --  22.9* 24.7*  PLT 275  --   --  216 219   < > = values in this interval not displayed.    Cardiac EnzymesNo results for input(s): TROPONINI in the last 168 hours. No results for input(s): TROPIPOC in the  last 168 hours.   BNP Recent Labs  Lab 10/06/18 2220  BNP 235.7*     DDimer  Recent Labs  Lab 10/07/18 0038  DDIMER 16.06*     Radiology    Dg Chest Port 1 View  Result Date: 10/10/2018 CLINICAL DATA:  85 year old with dyspnea. EXAM: PORTABLE CHEST 1 VIEW COMPARISON:  10/06/2018 and chest CT 10/07/2018 FINDINGS: Improved aeration in the lungs suggesting decreased airspace disease. There are bilateral pleural effusions that are small to moderate in size. Heart size is within normal limits and stable. Again noted is a cardiac ICD. Negative for a pneumothorax. IMPRESSION: Improved aeration in lungs suggesting decreased airspace disease or edema. Persistent bilateral pleural effusions. Electronically Signed   By: Markus Daft M.D.   On: 10/10/2018 16:18    Cardiac Studies   Echocardiogram 10/06/2018: Impressions: 1. The left ventricle has normal systolic function with an ejection fraction of 60-65%. The cavity size was normal. Left ventricular diastolic function could not be  evaluated due to nondiagnostic images. 2. The right ventricle has normal systolic function. The cavity was mildly enlarged. There is moderately increased right ventricular wall thickness. Right ventricular systolic pressure is severely elevated with an estimated pressure of 93.2 mmHg. 3. Left atrial size was mildly dilated. 4. The mitral valve is degenerative. Mild thickening of the mitral valve leaflet. 5. The tricuspid valve is not well visualized. Tricuspid valve regurgitation is mild-moderate. 6. The aortic valve was not well visualized. Mild thickening of the aortic valve. Aortic valve regurgitation is trivial by color flow Doppler. 7. The inferior vena cava was dilated in size with <50% respiratory variability.  Patient Profile     85 y.o. female with a history of non-obstructive CAD by cardiac catheterization in 1655, chronic diastolic CHFs/p ICD, permanent atrial fibrillation having failed multiple antiarrhythmic drugs and on Coumadin, bilateral carotid stenosis, hypertension, hyperlipidemia, and renal cancer,whopresented on 10/06/2018 for left hip pain after a mechanical fall and was found to have a left hip fracture. Cardiology was consulted for pre-operative evaluation.   She underwent surgery 6/5, recovering well. Per ortho. Hb drifting back down over the weekend.  Assessment & Plan    1. Acute on chronic diastolic heart failure - BNP was elevated on arrival and CXR suggestive of CHF - echo with preserved EF of 60-65% - lasix changed to 40 mg IV once daily yesterday - home dose is 40 mg torsemide daily with metolazine 5 mg once weekly - I&Os incomplete, no daily weights charted - patient appears only mildly volume overloaded this morning - continue another day of IV lasix, may consider metolazone today   2. CKD stage IV s/p nephrectomy - baseline creatinine 1.2-1.5 - sCr today 1.28, stable from yesterday, with K of 3.9 - diuresis as above   3. Permanent Atrial  fibrillation, AICD in place - INR 2.1, per pharmacy - rate controlled on exam   4. Anemia - Hb drifted back down over the weekend from 11 on 6/5 to 7.8 on 6/7 - last transfusion was 10/08/18 - per primary - repeat CBC pending today - no obvious signs of bleeding   5. Left femoral neck fracture after mechanical fall - surgery on 10/09/18, recovering well - per ortho   6. Right hand tremor - seems largely resolved   When discharging to SNF, resume her home torsemide and metolazone regimen. Appreciate recommendations from pharmacy on timing of INR at facility.     For questions or updates, please contact [place] Please  consult [email or web address] for contact info under        Signed, Ledora Bottcher, PA  10/12/2018, 7:44 AM

## 2018-10-12 NOTE — TOC Transition Note (Addendum)
Transition of Care [facility]) - CM/SW Discharge Note   Patient Details  Name: MELVIN MARMO MRN: [id number] Date of Birth: 10-Sep-1933  Transition of Care [facility]) CM/SW Contact:  Alberteen Sam, [place] Phone Number: [phone] 10/12/2018, 2:25 PM   Clinical Narrative:     Patient will DC IF:[facility] Place Anticipated DC date: 10/12/2018 Family notified:Christy - lvm, also spoke with [name] (daughter)  Transport OI:NOMV  Per MD patient ready for DC to [facility] . RN, patient, patient's family, and facility notified of DC. Discharge Summary sent to facility. RN given number for report  [phone]. DC packet on chart. Ambulance transport requested for patient for 3:00 pm.  CSW signing off.  [place], [place]   Final next level of care: [place] Barriers to Discharge: No Barriers Identified   Patient Goals and CMS Choice Patient states their goals for this hospitalization and ongoing recovery are:: to go to [facility] then home CMS Medicare.gov Compare Post Acute Care list provided to:: Patient Choice offered to / list presented to : Patient  Discharge Placement PASRR number recieved: 10/11/18            Patient chooses bed at: [facility] Patient to be transferred to facility by: [facility] Name of family member notified: Granddaughter Christy Patient and family notified of of transfer: 10/12/18  Discharge Plan and Services                                     Social Determinants of Health (SDOH) Interventions     Readmission Risk Interventions No flowsheet data found.

## 2018-10-12 NOTE — Consult Note (Signed)
   [facility] Inpatient Consult   10/12/2018  KYANI SIMKIN [birth year] [id number]   Patient was screened for extreme high risk score of 30% for unplanned readmission and 2  Hospitalizations in the past 6 months noted.  Patient currently being recommended CIR verses skilled nursing for rehab.   Plan:  No current [facility] Care Management needs noted if patient goes to rehab.  If disposition changes and lease place a [facility] Care Management consult as appropriate and for questions contact:   Natividad Brood, RN BSN [facility] Liaison  [phone] business mobile phone Toll free office [phone]  Fax number: [phone] [email or web address]

## 2018-10-12 NOTE — Progress Notes (Signed)
Insurance still under review with [employer] as of 12:25, CSW will continue to follow up and notify nurse once insurance auth received.   [place], [place]

## 2018-10-13 DIAGNOSIS — E785 Hyperlipidemia, unspecified: Secondary | ICD-10-CM | POA: Diagnosis not present

## 2018-10-13 DIAGNOSIS — R52 Pain, unspecified: Secondary | ICD-10-CM | POA: Diagnosis not present

## 2018-10-13 DIAGNOSIS — J9602 Acute respiratory failure with hypercapnia: Secondary | ICD-10-CM | POA: Diagnosis not present

## 2018-10-13 DIAGNOSIS — S72002D Fracture of unspecified part of neck of left femur, subsequent encounter for closed fracture with routine healing: Secondary | ICD-10-CM | POA: Diagnosis not present

## 2018-10-14 ENCOUNTER — Telehealth: Payer: Self-pay | Admitting: Internal Medicine

## 2018-10-14 ENCOUNTER — Telehealth: Payer: Self-pay | Admitting: *Deleted

## 2018-10-14 NOTE — Telephone Encounter (Addendum)
Patient's daughter [name] (on [place]) called stating that her mom broke her hip and is now at [facility]. [name] stated that she has concerns because patient has had a problem with her hemoglobin dropping and she had to have several pints of blood while in the hospital. [name] was calling to make sure that her blood work has been repeated at [facility]. [name] stated that the family is not able to visit [facility], but she talked to her mom last night and a doctor had been by to see her. [name] also stated that patient's oxygen level has dropped several times and wants to make sure that she is on oxygen as she needs it.  Lynelle Doctor unfortunately while patient is at [facility] the physician at the facility will have to oversee and order all necessary test for patient. Explained to [name] that the facility has their on in house physician that oversees all of the care for the patients there. [name] verbalized understanding and stated that she will contact [facility] and let them know her concerns.

## 2018-10-14 NOTE — Telephone Encounter (Signed)
Spoke with pt grandaughter, explained that monitor does not have to be taken to the facility as long as the pt returns home before her next scheduled remote on 11/09/18. Call was lost, will attempt to call back.   Grandaughter, [name], is concerned about pts fluid status. Explained that the facility will address fluid concerns with their physician. Confirmed remote transmission on 11/09/18 and f/u with Dr. Caryl Comes 11/24/18.  Christy verbalized understanding.

## 2018-10-14 NOTE — Telephone Encounter (Signed)
Patient's granddaughter [name] called stating that she wants to bring her defibrillator to the office for [place] to run a report. Advise [name] that she needs to contact patient's cardiologist because we do not do that here and she verbalized understanding.

## 2018-10-14 NOTE — Telephone Encounter (Signed)
°  1. Has your device fired? UNKNOWN  2. Is you device beeping? UNKNOWN  3. Are you experiencing draining or swelling at device site? UNKNOWN  4. Are you calling to see if we received your device transmission? NO  5. Have you passed out? UNKNOWN  Granddaughter on DPR, [name] calling to check for need to take remote box to [place] place for upcoming remote check.  Planned LOS for patient is 1 wk    Please route to [place]

## 2018-10-14 NOTE — Telephone Encounter (Signed)
Noted  

## 2018-10-16 DIAGNOSIS — B373 Candidiasis of vulva and vagina: Secondary | ICD-10-CM | POA: Diagnosis not present

## 2018-10-16 DIAGNOSIS — Z79899 Other long term (current) drug therapy: Secondary | ICD-10-CM | POA: Diagnosis not present

## 2018-10-16 DIAGNOSIS — Z7901 Long term (current) use of anticoagulants: Secondary | ICD-10-CM | POA: Diagnosis not present

## 2018-10-20 DIAGNOSIS — Z7901 Long term (current) use of anticoagulants: Secondary | ICD-10-CM | POA: Diagnosis not present

## 2018-10-20 DIAGNOSIS — B373 Candidiasis of vulva and vagina: Secondary | ICD-10-CM | POA: Diagnosis not present

## 2018-10-20 DIAGNOSIS — Z79899 Other long term (current) drug therapy: Secondary | ICD-10-CM | POA: Diagnosis not present

## 2018-10-20 NOTE — Progress Notes (Signed)
Virtual Visit via Video Note   This visit type was conducted due to national recommendations for restrictions regarding the COVID-19 Pandemic (e.g. social distancing) in an effort to limit this patient's exposure and mitigate transmission in our community.  Due to her co-morbid illnesses, this patient is at least at moderate risk for complications without adequate follow up.  This format is felt to be most appropriate for this patient at this time.  All issues noted in this document were discussed and addressed.  A limited physical exam was performed with this format.  Please refer to the patient's chart for her consent to telehealth for [facility].   I connected with  [name] on 10/21/18 by a video enabled telemedicine application and verified that I am speaking with the correct person using two identifiers. I discussed the limitations of evaluation and management by telemedicine. The patient expressed understanding and agreed to proceed.   Evaluation Performed:  Follow-up visit  Date:  10/21/2018   ID:  [name], DOB [birth year], MRN [id number]  Patient Location:  [place] [address]   Provider location:   [facility], [place] office  PCP:  Elby Beck, FNP  Cardiologist:  Patsy Baltimore   Chief Complaint:  Leg swelling    History of Present Illness:    [name] is a 85 y.o. female who presents via audio/video conferencing for a telehealth visit today.   The patient does not symptoms concerning for COVID-19 infection (fever, chills, cough, or new SHORTNESS OF BREATH).   Patient has a past medical history of syncope,  symptomatic bradycardia, status post ICD.  nonobstructive coronary artery disease with chronic chest pain, by catheterization 2006 bilateral carotid artery stenosis 60-70% disease on the left, Greater than 50% iliac disease bilaterally paroxysmal atrial fibrillation,  renal cell carcinoma  status post resection of her right kidney with residual chronic renal insufficiency,  COPD, stopped smoking over 10 years ago. smoked for more than 30 years chronic leg pain single kidney, creatinine 1.4 She presents for routine follow-up of her carotid arterial disease, atrial fibrillation.  Broke her hip past 2 weeks Severe anemia hematocrit down to 7.5 beginning of June 2020 Received several units of blood Following hip surgery October 09, 2018 had blood loss anemia again hemoglobin in the 7.8 range  now at [facility]. Significant lower extremity edema in the setting of anemia Unable to put compression hose on  Medications reviewed with her she is on torsemide 40 daily with metolazone 5 mg once a week Potassium stable  Scheduled to go home in the next week or so Reports that she was walking without her walker when she fell  Other past medical history reviewed Greater than 50% iliac disease bilaterally  Previous carotid ultrasound results reviewed with her in detail 07/2017 Right Carotid: Velocities in the right ICA are consistent with a 1-39% stenosis.  Left Carotid: Velocities in the left ICA are consistent with a 40-59% stenosis.  Optivol typically runs low  previous episode of chest pain, chronic shortness of breath Echocardiogram 2016 and most recently 2017 with elevated right heart pressures, 70 mmHg   periodic symptoms of chest tightness, shortness of breath  Echocardiogram results reviewed with her in detail, elevated right heart pressures, normal ejection fraction  Carotid ultrasound reviewed with her showing stable moderate to severe bilateral carotid disease Other past medical history  She did try flecainide in the past and she had  significant side effects as well as breakthrough arrhythmia.  right hip replacement on May 03 2011.  Chronic dizziness since the summer of 2014 . Symptoms were worse in December 2014  Echocardiogram showed normal  ejection fraction, moderate mitral valve regurgitation, moderate TR, moderately elevated right ventricular systolic pressures.  stress test in March 2011.   Last carotid u/s 40-59% R 60-79%. No neuro symptoms.   Cardiac cath 2006 --non-obstructive CAD 30-40s lesions She reports having repeat catheterization since that time with no significant progression of her disease     Prior CV studies:   The following studies were reviewed today:    Past Medical History:  Diagnosis Date  . Adjustment disorder with anxiety   . Anginal pain ([place])   . Arthritis    "some in my hands" (11/11/2012)  . Atrial fibrillation ([place])   . Automatic implantable cardioverter-defibrillator in situ   . Blood in stool   . Carotid artery stenosis 09/2007   a. 09/2007: 60-79% bilateral (stable); b. 10/2008: 40-59% R 60-79%   . Chest pain, unspecified   . Chronic airway obstruction, not elsewhere classified   . Chronic bronchitis ([place])    "get it some; not q year" (11/11/2012)  . Chronic diastolic CHF (congestive heart failure) ([place]) 06/04/2013  . Chronic kidney disease, unspecified   . Coronary artery disease    non-obstructive by 2006 cath  . Frequent UTI    "get them a couple times/yr" (11/11/2012)  . Hemorrhage of rectum and anus   . High cholesterol   . History of blood transfusion 04/2011   "after hip OR" (11/11/2012)  . Hypertension 05/20/2011  . Hypertr obst cardiomyop   . Hypotension, unspecified    cardiac cath 2006..nonobstructive CAD 30-40s lesions.Marland KitchenETT 1/09 nondiagnostic due to poor HR response..Right Renal Cancer 2003  . Long term (current) use of anticoagulants   . Malignant neoplasm of kidney, except pelvis   . Osteoarthritis of right hip   . Other and unspecified coagulation defects   . PONV (postoperative nausea and vomiting)   . Renal cancer ([place]) 06/2001   Right  . Secondary cardiomyopathy, unspecified   . Sinus bradycardia   . Urge incontinence    Past Surgical History:   Procedure Laterality Date  . ABDOMINAL HYSTERECTOMY  1975   for benign causes  . APPENDECTOMY    . BI-VENTRICULAR PACEMAKER UPGRADE  05/04/2010  . CARDIAC CATHETERIZATION  2006  . CARDIOVERSION N/A 02/20/2018   Procedure: CARDIOVERSION;  Surgeon: Minna Merritts, MD;  Location: [facility];  Service: Cardiovascular;  Laterality: N/A;  . CARDIOVERSION N/A 03/27/2018   Procedure: CARDIOVERSION;  Surgeon: Minna Merritts, MD;  Location: [facility];  Service: Cardiovascular;  Laterality: N/A;  . CATARACT EXTRACTION W/ INTRAOCULAR LENS  IMPLANT, BILATERAL  01/2006-02-2006  . CHOLECYSTECTOMY N/A 11/11/2012   Procedure: LAPAROSCOPIC CHOLECYSTECTOMY WITH INTRAOPERATIVE CHOLANGIOGRAM;  Surgeon: Imogene Burn. Georgette Dover, MD;  Location: [place];  Service: General;  Laterality: N/A;  . EP IMPLANTABLE DEVICE N/A 02/21/2016   Procedure: ICD Generator Changeout;  Surgeon: Deboraha Sprang, MD;  Location: [facility];  Service: Cardiovascular;  Laterality: N/A;  . INSERT / REPLACE / REMOVE PACEMAKER  05-01-11   02-28-05-/05-04-10-ICD-MEDTRONIC MAXIMAL DR  . JOINT REPLACEMENT    . LAPAROSCOPIC CHOLECYSTECTOMY  11/11/2012  . LAPAROSCOPIC LYSIS OF ADHESIONS N/A 11/11/2012   Procedure: LAPAROSCOPIC LYSIS OF ADHESIONS;  Surgeon: Imogene Burn. Georgette Dover, MD;  Location: [place];  Service: General;  Laterality: N/A;  . NEPHRECTOMY Right 06/2001  S/P RENAL CELL CANCER  . TOTAL HIP ARTHROPLASTY Right 05/03/2011   Procedure: TOTAL HIP ARTHROPLASTY ANTERIOR APPROACH;  Surgeon: Mcarthur Rossetti;  Location: [facility];  Service: Orthopedics;  Laterality: Right;  Removal of Cannulated Screws Right Hip, Right Direct Anterior Hip Replacement  . TOTAL HIP ARTHROPLASTY Left 10/09/2018   Procedure: TOTAL HIP ARTHROPLASTY ANTERIOR APPROACH;  Surgeon: Rod Can, MD;  Location: [place];  Service: Orthopedics;  Laterality: Left;     No outpatient medications have been marked as taking for the 10/21/18 encounter (Appointment) with Minna Merritts, MD.     Allergies:   Codeine, Morphine and related, and Sulfonamide derivatives   Social History   Tobacco Use  . Smoking status: Former Smoker    Packs/day: 0.50    Years: 40.00    Pack years: 20.00    Types: Cigarettes    Quit date: 05/06/2001    Years since quitting: 17.4  . Smokeless tobacco: Never Used  Substance Use Topics  . Alcohol use: No    Alcohol/week: 0.0 standard drinks  . Drug use: No     Current Outpatient Medications on File Prior to Visit  Medication Sig Dispense Refill  . acetaminophen (TYLENOL) 325 MG tablet Take 2 tablets (650 mg total) by mouth every 6 (six) hours as needed for mild pain or moderate pain (or Fever >/= 101).    . bisacodyl (DULCOLAX) 5 MG EC tablet Take 1 tablet (5 mg total) by mouth daily as needed for moderate constipation. 10 tablet 0  . cetirizine (ZYRTEC) 10 MG tablet Take 10 mg by mouth daily.     Marland Kitchen conjugated estrogens (PREMARIN) vaginal cream Place 1 Applicatorful vaginally daily. 42.5 g 12  . CRANBERRY PO Take 4 tablets by mouth daily.    Marland Kitchen docusate sodium (COLACE) 100 MG capsule Take 1 capsule (100 mg total) by mouth 2 (two) times daily. 10 capsule 0  . ezetimibe (ZETIA) 10 MG tablet Take 1 tablet (10 mg total) by mouth daily. 30 tablet 11  . fluticasone (FLONASE) 50 MCG/ACT nasal spray Place 2 sprays into both nostrils daily. 16 g 6  . hydrocortisone 2.5 % cream Apply 1 application topically daily as needed (itching).     Marland Kitchen levothyroxine (SYNTHROID) 25 MCG tablet Take 1 tablet (25 mcg total) by mouth daily before breakfast. 30 tablet 3  . magnesium oxide (MAG-OX) 400 MG tablet Take 1 tablet (400 mg total) by mouth daily. 30 tablet 2  . metolazone (ZAROXOLYN) 5 MG tablet Take 1 tablet (5 mg total) once a week. (Patient taking differently: Take 5 mg by mouth See admin instructions. Take 5 mg twice a week) 12 tablet 3  . potassium chloride SA (K-DUR) 20 MEQ tablet Take 1 tablet (20 meq) by mouth twice daily, except on  metolazone days take 2 tablets (40 meq) twice daily    . rosuvastatin (CRESTOR) 40 MG tablet Take 1 tablet (40 mg total) by mouth daily. 90 tablet 3  . torsemide (DEMADEX) 20 MG tablet Take 2 tablets (40 mg) by mouth once daily. 180 tablet 3  . traMADol (ULTRAM) 50 MG tablet Take 1 tablet (50 mg total) by mouth every 12 (twelve) hours as needed for severe pain. 10 tablet 0  . warfarin (COUMADIN) 2 MG tablet Take 1 tablet (2 mg total) by mouth as directed. Take 2 mg on Monday- Saturday and 1 Mg on Sunday     No current facility-administered medications on file prior to visit.  Family Hx: The patient's family history includes Breast cancer in her cousin and another family member; Breast cancer (age of onset: 52) in her maternal aunt; Heart failure in her mother. There is no history of Colon cancer.  ROS:   Please see the history of present illness.    Review of Systems  Constitutional: Positive for malaise/fatigue.  Respiratory: Negative.   Cardiovascular: Positive for leg swelling.  Gastrointestinal: Negative.   Musculoskeletal: Negative.   Neurological: Negative.   Psychiatric/Behavioral: Negative.   All other systems reviewed and are negative.     Labs/Other Tests and Data Reviewed:    Recent Labs: 06/12/2018: ALT 14; TSH 6.001 10/06/2018: B Natriuretic Peptide 235.7 10/12/2018: BUN 27; Creatinine, Ser 1.28; Hemoglobin 7.8; Magnesium 2.2; Platelets 230; Potassium 3.9; Sodium 138   Recent Lipid Panel Lab Results  Component Value Date/Time   CHOL 179 03/19/2016 11:23 AM   CHOL 271 (H) 06/09/2014 01:17 PM   TRIG 110.0 03/19/2016 11:23 AM   TRIG 241 (H) 06/09/2014 01:17 PM   HDL 39.10 03/19/2016 11:23 AM   HDL 40 06/09/2014 01:17 PM   CHOLHDL 5 03/19/2016 11:23 AM   LDLCALC 118 (H) 03/19/2016 11:23 AM   LDLCALC 183 (H) 06/09/2014 01:17 PM   LDLDIRECT 146.0 11/07/2016 11:28 AM    Wt Readings from Last 3 Encounters:  10/09/18 169 lb 8.5 oz (76.9 kg)  10/05/18 172 lb (78 kg)   09/22/18 174 lb 9.6 oz (79.2 kg)     Exam:    Vital Signs: Vital signs may also be detailed in the HPI There were no vitals taken for this visit.  Wt Readings from Last 3 Encounters:  10/09/18 169 lb 8.5 oz (76.9 kg)  10/05/18 172 lb (78 kg)  09/22/18 174 lb 9.6 oz (79.2 kg)   Temp Readings from Last 3 Encounters:  10/12/18 98.1 F (36.7 C) (Oral)  09/22/18 (!) 97.3 F (36.3 C) (Oral)  09/08/18 (!) 97.5 F (36.4 C) (Oral)   BP Readings from Last 3 Encounters:  10/12/18 128/61  10/05/18 (!) 90/50  09/22/18 (!) 147/66   Pulse Readings from Last 3 Encounters:  10/12/18 73  10/05/18 75  09/22/18 65     Well nourished, well developed female in no acute distress. Constitutional:  oriented to person, place, and time. No distress.  Head: Normocephalic and atraumatic.  Eyes:  no discharge. No scleral icterus.  Neck: Normal range of motion. Neck supple.  Left greater than right pitting lower extremity edema Pulmonary/Chest: No audible wheezing, no distress, appears comfortable Musculoskeletal: Normal range of motion.  no  tenderness or deformity.  Neurological:   Coordination normal. Full exam not performed Skin:  No rash Psychiatric:  normal mood and affect. behavior is normal. Thought content normal.    ASSESSMENT & PLAN:    Chronic diastolic CHF (congestive heart failure) (HCC) -  Worsening lower extremity edema in the setting of profound anemia Discussed with her, leg swelling should improve as anemia resolves For now we will increase torsemide 40 daily with metolazone twice a week 5 mg rather than once a week Recommended leg wraps with Ace wraps  ICD (implantable cardioverter-defibrillator) in place  Followed by EP  Chronic kidney disease (CKD), stage IV (severe) (HCC) - Previous creatinine 1.28, Small increase in metolazone, suggested she increase metolazone up to 5 mg twice a week Will need careful monitoring of renal function Potassium stable  Persistent  atrial fibrillation -  On anticoagulation  Shortness of breath - Stable, sedentary, spends  much of her time in a wheelchair following surgery for hip  Anemia, unspecified type  Discussed with her at length, blood loss anemia Likely needs iron infusion She is on iron pill twice a day Scheduled to see hematology   COVID-19 Education: The signs and symptoms of COVID-19 were discussed with the patient and how to seek care for testing (follow up with PCP or arrange E-visit).  The importance of social distancing was discussed today.  Patient Risk:   After full review of this patients clinical status, I feel that they are at least moderate risk at this time.  Time:   Today, I have spent 25 minutes with the patient with telehealth technology discussing the cardiac and medical problems/diagnoses detailed above   10 min spent reviewing the chart prior to patient visit today   Medication Adjustments/Labs and Tests Ordered: Current medicines are reviewed at length with the patient today.  Concerns regarding medicines are outlined above.   Tests Ordered: No tests ordered   Medication Changes: No changes made   Disposition: Follow-up in 3 months   Signed, Ida Rogue, MD  10/21/2018 2:29 PM    [place] Office [address], [place], [place] [address]

## 2018-10-21 ENCOUNTER — Telehealth (INDEPENDENT_AMBULATORY_CARE_PROVIDER_SITE_OTHER): Payer: PPO | Admitting: Cardiovascular Disease

## 2018-10-21 ENCOUNTER — Other Ambulatory Visit: Payer: Self-pay

## 2018-10-21 DIAGNOSIS — I5032 Chronic diastolic (congestive) heart failure: Secondary | ICD-10-CM | POA: Diagnosis not present

## 2018-10-21 DIAGNOSIS — Z95810 Presence of automatic (implantable) cardiac defibrillator: Secondary | ICD-10-CM

## 2018-10-21 DIAGNOSIS — I4819 Other persistent atrial fibrillation: Secondary | ICD-10-CM | POA: Diagnosis not present

## 2018-10-21 DIAGNOSIS — R0602 Shortness of breath: Secondary | ICD-10-CM

## 2018-10-21 DIAGNOSIS — D649 Anemia, unspecified: Secondary | ICD-10-CM

## 2018-10-21 DIAGNOSIS — Z7901 Long term (current) use of anticoagulants: Secondary | ICD-10-CM | POA: Diagnosis not present

## 2018-10-21 DIAGNOSIS — B373 Candidiasis of vulva and vagina: Secondary | ICD-10-CM | POA: Diagnosis not present

## 2018-10-21 DIAGNOSIS — Z79899 Other long term (current) drug therapy: Secondary | ICD-10-CM | POA: Diagnosis not present

## 2018-10-21 DIAGNOSIS — N184 Chronic kidney disease, stage 4 (severe): Secondary | ICD-10-CM | POA: Diagnosis not present

## 2018-10-21 MED ORDER — METOLAZONE 5 MG PO TABS: 5.0000 mg | ORAL_TABLET | ORAL | 0 refills | Status: DC

## 2018-10-21 NOTE — Patient Instructions (Addendum)
Medication Instructions:  Increase metolazone up to 5 mg twice a week up from once a week Continue torsemide 40 daily Ace wraps for lower extremity edema Patient is aware of above Nurse at [facility] aware and willing to take verbal order on today's call  If you need a refill on your cardiac medications before your next appointment, please call your pharmacy.    Lab work: No new labs needed   If you have labs (blood work) drawn today and your tests are completely normal, you will receive your results only by: Marland Kitchen MyChart Message (if you have MyChart) OR . A paper copy in the mail If you have any lab test that is abnormal or we need to change your treatment, we will call you to review the results.   Testing/Procedures: No new testing needed   Follow-Up: At [facility], you and your health needs are our priority.  As part of our continuing mission to provide you with exceptional heart care, we have created designated Provider Care Teams.  These Care Teams include your primary Cardiologist (physician) and Advanced Practice Providers (APPs -  Physician Assistants and Nurse Practitioners) who all work together to provide you with the care you need, when you need it.  . You will need a follow up appointment in 3 months .   Please call our office 2 months in advance to schedule this appointment.    . Providers on your designated Care Team:   . Murray Hodgkins, NP . Christell Faith, PA-C . Marrianne Mood, PA-C  Any Other Special Instructions Will Be Listed Below (If Applicable).  For educational health videos Log in to : [email or web address] Or : [email or web address], password : [email or web address]

## 2018-10-23 ENCOUNTER — Telehealth: Payer: Self-pay | Admitting: Family Medicine

## 2018-10-23 DIAGNOSIS — Z79899 Other long term (current) drug therapy: Secondary | ICD-10-CM | POA: Diagnosis not present

## 2018-10-23 DIAGNOSIS — B373 Candidiasis of vulva and vagina: Secondary | ICD-10-CM | POA: Diagnosis not present

## 2018-10-23 DIAGNOSIS — S72002D Fracture of unspecified part of neck of left femur, subsequent encounter for closed fracture with routine healing: Secondary | ICD-10-CM | POA: Diagnosis not present

## 2018-10-23 DIAGNOSIS — Z7901 Long term (current) use of anticoagulants: Secondary | ICD-10-CM | POA: Diagnosis not present

## 2018-10-23 NOTE — Telephone Encounter (Signed)
error 

## 2018-10-26 ENCOUNTER — Other Ambulatory Visit: Payer: Self-pay | Admitting: Family Medicine

## 2018-10-26 ENCOUNTER — Ambulatory Visit: Payer: PPO | Admitting: Family Medicine

## 2018-10-26 ENCOUNTER — Ambulatory Visit (INDEPENDENT_AMBULATORY_CARE_PROVIDER_SITE_OTHER): Payer: PPO

## 2018-10-26 ENCOUNTER — Other Ambulatory Visit: Payer: Self-pay

## 2018-10-26 ENCOUNTER — Other Ambulatory Visit: Payer: PPO

## 2018-10-26 DIAGNOSIS — Z7901 Long term (current) use of anticoagulants: Secondary | ICD-10-CM

## 2018-10-26 DIAGNOSIS — S72032D Displaced midcervical fracture of left femur, subsequent encounter for closed fracture with routine healing: Secondary | ICD-10-CM | POA: Diagnosis not present

## 2018-10-26 DIAGNOSIS — I4819 Other persistent atrial fibrillation: Secondary | ICD-10-CM

## 2018-10-26 DIAGNOSIS — N184 Chronic kidney disease, stage 4 (severe): Secondary | ICD-10-CM

## 2018-10-26 LAB — POCT INR: INR: 3.4 — AB (ref 2.0–3.0)

## 2018-10-26 NOTE — Patient Instructions (Signed)
INR today 3.4 *patient was on 1 pill for yeast infection 1 week ago, no longer taking).  Hold Coumadin today (6/22) then restart 2mg  daily tomorrow and recheck on Thursday (6/25) by home health nurse. Will monitor closely right now.  Patient aware and verbalizes understanding of instructions.

## 2018-10-27 ENCOUNTER — Telehealth: Payer: Self-pay

## 2018-10-27 NOTE — Telephone Encounter (Signed)
Noted  

## 2018-10-27 NOTE — Telephone Encounter (Signed)
Spoke with nurse at [facility] regarding lab orders for [facility].  She is going to see her Thursday and they will order CBC, BMET and recheck her INR for me.   Debbie,   I will be out of the office starting Thursday afternoon and will return on Monday 11/09/18.    Please oversee patient's coumadin while I am out of the office.  She held her dose for me Monday and then was to take 2mg  daily until recheck on Thursday.   She has 1 more pill of medication for yeast infection that she was to repeat but she may not take it.  If she wants to take it she is to inform you so that you may consider halfing her warfarin dosage for a few days to offset the likely supratherapeutic read and set up a much sooner INR check.   Please take that under consideration when receiving her INR result from this week.   Also, I have rescheduled her to this Friday @12  for a virtual visit.  Her daughter will need to agree to this and coordinate this time to use her smartphone [phone] to text doxy link to.  I have left a message with the daughter to please call back and confirm that she can help her mom with this at that time; otherwise they will need to call back and reschedule.  I will ask the front office staff to check back in with daughter to ensure she is okay with this over the next day or 2.

## 2018-10-27 NOTE — Progress Notes (Signed)
I have reviewed this visit and I agree on the patient's plan of dosage and recommendations. Deborah B Gessner, FNP   

## 2018-10-28 ENCOUNTER — Ambulatory Visit: Payer: PPO | Admitting: Family Medicine

## 2018-10-28 ENCOUNTER — Telehealth: Payer: Self-pay | Admitting: Internal Medicine

## 2018-10-28 DIAGNOSIS — I130 Hypertensive heart and chronic kidney disease with heart failure and stage 1 through stage 4 chronic kidney disease, or unspecified chronic kidney disease: Secondary | ICD-10-CM | POA: Diagnosis not present

## 2018-10-28 DIAGNOSIS — D631 Anemia in chronic kidney disease: Secondary | ICD-10-CM | POA: Diagnosis not present

## 2018-10-28 DIAGNOSIS — Z950 Presence of cardiac pacemaker: Secondary | ICD-10-CM | POA: Diagnosis not present

## 2018-10-28 DIAGNOSIS — I5032 Chronic diastolic (congestive) heart failure: Secondary | ICD-10-CM | POA: Diagnosis not present

## 2018-10-28 DIAGNOSIS — Z8673 Personal history of transient ischemic attack (TIA), and cerebral infarction without residual deficits: Secondary | ICD-10-CM | POA: Diagnosis not present

## 2018-10-28 DIAGNOSIS — Z90710 Acquired absence of both cervix and uterus: Secondary | ICD-10-CM | POA: Diagnosis not present

## 2018-10-28 DIAGNOSIS — I959 Hypotension, unspecified: Secondary | ICD-10-CM | POA: Diagnosis not present

## 2018-10-28 DIAGNOSIS — Z9181 History of falling: Secondary | ICD-10-CM | POA: Diagnosis not present

## 2018-10-28 DIAGNOSIS — I10 Essential (primary) hypertension: Secondary | ICD-10-CM | POA: Diagnosis not present

## 2018-10-28 DIAGNOSIS — M1990 Unspecified osteoarthritis, unspecified site: Secondary | ICD-10-CM | POA: Diagnosis not present

## 2018-10-28 DIAGNOSIS — S72132D Displaced apophyseal fracture of left femur, subsequent encounter for closed fracture with routine healing: Secondary | ICD-10-CM | POA: Diagnosis not present

## 2018-10-28 DIAGNOSIS — Z79891 Long term (current) use of opiate analgesic: Secondary | ICD-10-CM | POA: Diagnosis not present

## 2018-10-28 DIAGNOSIS — Z96643 Presence of artificial hip joint, bilateral: Secondary | ICD-10-CM | POA: Diagnosis not present

## 2018-10-28 DIAGNOSIS — I4891 Unspecified atrial fibrillation: Secondary | ICD-10-CM | POA: Diagnosis not present

## 2018-10-28 DIAGNOSIS — N189 Chronic kidney disease, unspecified: Secondary | ICD-10-CM | POA: Diagnosis not present

## 2018-10-28 DIAGNOSIS — I2510 Atherosclerotic heart disease of native coronary artery without angina pectoris: Secondary | ICD-10-CM | POA: Diagnosis not present

## 2018-10-28 DIAGNOSIS — Z85528 Personal history of other malignant neoplasm of kidney: Secondary | ICD-10-CM | POA: Diagnosis not present

## 2018-10-28 DIAGNOSIS — Z7901 Long term (current) use of anticoagulants: Secondary | ICD-10-CM | POA: Diagnosis not present

## 2018-10-28 NOTE — Telephone Encounter (Signed)
Notes recorded by Deboraha Sprang, MD on 10/11/2018 at 9:27 PM EDT  [name]  She fell and broke her hip, CHilty saw her and noted two things 1- weight loss, and recommended followup with PCP  2- she has pulm HTN-- I went back and looked at her echo 2018 or so and it was present then-- when she gets over her hip, we should send her to CHF clinic for eval of that   Thanks SK

## 2018-10-28 NOTE — Telephone Encounter (Signed)
I called and spoke with the patient today. She is currently home from rehab with her hip A nurse from [facility] is there seeing her today.   I have notified her of her echo results and Dr. Olin Pia recommendations to have her follow up in the Pulmonary Hypertension Clinic when she is in a better place with her hip.   The patient is agreeable with the recommendation, but does want to wait a bit due to her hip. I have advised she can reach out to me when she is ready, or we are scheduled to see her on 11/24/18. She is aware we can discuss at that visit or she can call us closer to time and cancel her appt if she feels she is still not ready to come.

## 2018-10-29 ENCOUNTER — Telehealth: Payer: Self-pay

## 2018-10-29 NOTE — Telephone Encounter (Signed)
Jeannell nurse with [facility] left v/m requesting verbal orders for [facility] nursing 1 x a wk for 4 wks for disease and medication mgt.Please advise.

## 2018-10-30 ENCOUNTER — Encounter: Payer: Self-pay | Admitting: Family Medicine

## 2018-10-30 ENCOUNTER — Ambulatory Visit (INDEPENDENT_AMBULATORY_CARE_PROVIDER_SITE_OTHER): Payer: PPO | Admitting: Family Medicine

## 2018-10-30 DIAGNOSIS — M7989 Other specified soft tissue disorders: Secondary | ICD-10-CM | POA: Diagnosis not present

## 2018-10-30 DIAGNOSIS — R531 Weakness: Secondary | ICD-10-CM | POA: Diagnosis not present

## 2018-10-30 DIAGNOSIS — N898 Other specified noninflammatory disorders of vagina: Secondary | ICD-10-CM

## 2018-10-30 DIAGNOSIS — Z7901 Long term (current) use of anticoagulants: Secondary | ICD-10-CM

## 2018-10-30 DIAGNOSIS — D509 Iron deficiency anemia, unspecified: Secondary | ICD-10-CM

## 2018-10-30 SURGERY — ARTHROPLASTY, HIP, TOTAL, ANTERIOR APPROACH
Anesthesia: Choice | Site: Hip | Laterality: Left

## 2018-10-30 NOTE — Progress Notes (Signed)
Virtual Visit via Video Note  I connected with [name] on 10/30/18 at 12:00 PM EDT by a video enabled telemedicine application and verified that I am speaking with the correct person using two identifiers.  Location: Patient: In her home, granddaughter [name] also on call Provider: [place]   I discussed the limitations of evaluation and management by telemedicine and the availability of in person appointments. The patient expressed understanding and agreed to proceed.  History of Present Illness: This is an 85 yo female who presents today for video visit following recent hip replacement surgery and inpatient rehab stay. Her grand daughter, [name], is currently her primary caregiver. Home health has been coming out and they drew labs yesterday.   Main concerns- Terrible fatigue- she is able to transfer/toilet independently, little pain, but very tired.  Vaginal irritation- according to the patient and her grand daughter, vaginal testing showed yeast. The patient was given topical treatment without improvement and was advised she may need second dose of diflucan if symptoms not completely resolved. The patient reports some improvement but irritation and discharge persist.   Leg swelling- chronic, she is due to take metolazone dose tomorrow am. Has been following closely with cardiology who told her she can take metolazone twice a week if weight up. Patient thinks she has had a dose at the beginning of the week, but is not certain. She is having some weeping of her left lateral leg.   Past Medical History:  Diagnosis Date  . Adjustment disorder with anxiety   . Anginal pain ([place])   . Arthritis    "some in my hands" (11/11/2012)  . Atrial fibrillation ([place])   . Automatic implantable cardioverter-defibrillator in situ   . Blood in stool   . Carotid artery stenosis 09/2007   a. 09/2007: 60-79% bilateral (stable); b. 10/2008: 40-59% R 60-79%   . Chest pain, unspecified   .  Chronic airway obstruction, not elsewhere classified   . Chronic bronchitis ([place])    "get it some; not q year" (11/11/2012)  . Chronic diastolic CHF (congestive heart failure) ([place]) 06/04/2013  . Chronic kidney disease, unspecified   . Coronary artery disease    non-obstructive by 2006 cath  . Frequent UTI    "get them a couple times/yr" (11/11/2012)  . Hemorrhage of rectum and anus   . High cholesterol   . History of blood transfusion 04/2011   "after hip OR" (11/11/2012)  . Hypertension 05/20/2011  . Hypertr obst cardiomyop   . Hypotension, unspecified    cardiac cath 2006..nonobstructive CAD 30-40s lesions.Marland KitchenETT 1/09 nondiagnostic due to poor HR response..Right Renal Cancer 2003  . Long term (current) use of anticoagulants   . Malignant neoplasm of kidney, except pelvis   . Osteoarthritis of right hip   . Other and unspecified coagulation defects   . PONV (postoperative nausea and vomiting)   . Renal cancer ([place]) 06/2001   Right  . Secondary cardiomyopathy, unspecified   . Sinus bradycardia   . Urge incontinence    Past Surgical History:  Procedure Laterality Date  . ABDOMINAL HYSTERECTOMY  1975   for benign causes  . APPENDECTOMY    . BI-VENTRICULAR PACEMAKER UPGRADE  05/04/2010  . CARDIAC CATHETERIZATION  2006  . CARDIOVERSION N/A 02/20/2018   Procedure: CARDIOVERSION;  Surgeon: Minna Merritts, MD;  Location: [facility] ORS;  Service: Cardiovascular;  Laterality: N/A;  . CARDIOVERSION N/A 03/27/2018   Procedure: CARDIOVERSION;  Surgeon: Minna Merritts, MD;  Location: [facility]  ORS;  Service: Cardiovascular;  Laterality: N/A;  . CATARACT EXTRACTION W/ INTRAOCULAR LENS  IMPLANT, BILATERAL  01/2006-02-2006  . CHOLECYSTECTOMY N/A 11/11/2012   Procedure: LAPAROSCOPIC CHOLECYSTECTOMY WITH INTRAOPERATIVE CHOLANGIOGRAM;  Surgeon: Imogene Burn. Georgette Dover, MD;  Location: [facility];  Service: General;  Laterality: N/A;  . EP IMPLANTABLE DEVICE N/A 02/21/2016   Procedure: ICD Generator Changeout;  Surgeon:  Deboraha Sprang, MD;  Location: [facility];  Service: Cardiovascular;  Laterality: N/A;  . INSERT / REPLACE / REMOVE PACEMAKER  05-01-11   02-28-05-/05-04-10-ICD-MEDTRONIC MAXIMAL DR  . JOINT REPLACEMENT    . LAPAROSCOPIC CHOLECYSTECTOMY  11/11/2012  . LAPAROSCOPIC LYSIS OF ADHESIONS N/A 11/11/2012   Procedure: LAPAROSCOPIC LYSIS OF ADHESIONS;  Surgeon: Imogene Burn. Georgette Dover, MD;  Location: [facility];  Service: General;  Laterality: N/A;  . NEPHRECTOMY Right 06/2001    S/P RENAL CELL CANCER  . TOTAL HIP ARTHROPLASTY Right 05/03/2011   Procedure: TOTAL HIP ARTHROPLASTY ANTERIOR APPROACH;  Surgeon: Mcarthur Rossetti;  Location: [facility];  Service: Orthopedics;  Laterality: Right;  Removal of Cannulated Screws Right Hip, Right Direct Anterior Hip Replacement  . TOTAL HIP ARTHROPLASTY Left 10/09/2018   Procedure: TOTAL HIP ARTHROPLASTY ANTERIOR APPROACH;  Surgeon: Rod Can, MD;  Location: [facility];  Service: Orthopedics;  Laterality: Left;   Family History  Problem Relation Age of Onset  . Heart failure Mother   . Breast cancer Maternal Aunt 82  . Breast cancer Cousin   . Breast cancer Other   . Colon cancer Neg Hx    Social History   Tobacco Use  . Smoking status: Former Smoker    Packs/day: 0.50    Years: 40.00    Pack years: 20.00    Types: Cigarettes    Quit date: 05/06/2001    Years since quitting: 17.4  . Smokeless tobacco: Never Used  Substance Use Topics  . Alcohol use: No    Alcohol/week: 0.0 standard drinks  . Drug use: No      Observations/Objective: The patient is alert and answers questions appropriately. She is able to talk in complete sentences. There is no audible wheeze or cough. Her legs are edematous and there appear to be a few areas of scabbing on left lateral leg. Mood and affect appropriate.  There were no vitals taken for this visit. Wt Readings from Last 3 Encounters:  10/09/18 169 lb 8.5 oz (76.9 kg)  10/05/18 172 lb (78 kg)  09/22/18 174 lb 9.6 oz  (79.2 kg)   BP Readings from Last 3 Encounters:  10/12/18 128/61  10/05/18 (!) 90/50  09/22/18 (!) 147/66    Assessment and Plan: 1. Weakness - she had labs drawn yesterday by home health - hard to know if worsening of chronic anemia, CHF or deconditioning following surgery, will await labs  2. Long term (current) use of anticoagulants - last INR 3.6, drawn yesterday, no acute bleeding  3. Iron deficiency anemia, unspecified iron deficiency anemia type - she had previous hematology referral and appointment set up for consultation but she was unable to make appointment due to   4. Vaginal discharge - had one dose of diflucan while at [facility] place with some relief but not complete relief and no relief with vaginal treatment, discussed effect of diflucan on warfarin effect, will need INR prior to any dosing of diflucan.   5. Leg swelling - called [facility] and discussed wrapping patient's legs and importance of daily weights.    Clarene Reamer, FNP-BC  [place]  [facility]  11/03/2018 7:53 PM   Follow Up Instructions:    I discussed the assessment and treatment plan with the patient. The patient was provided an opportunity to ask questions and all were answered. The patient agreed with the plan and demonstrated an understanding of the instructions.   The patient was advised to call back or seek an in-person evaluation if the symptoms worsen or if the condition fails to improve as anticipated.   Elby Beck, FNP

## 2018-10-30 NOTE — Telephone Encounter (Signed)
Left message to [facility] services as requested.

## 2018-10-30 NOTE — Telephone Encounter (Signed)
[name], I was not sure how you would want proceed. Thank you

## 2018-11-02 ENCOUNTER — Other Ambulatory Visit: Payer: Self-pay | Admitting: Family Medicine

## 2018-11-02 ENCOUNTER — Encounter: Payer: Self-pay | Admitting: Family Medicine

## 2018-11-02 ENCOUNTER — Telehealth: Payer: Self-pay | Admitting: Family Medicine

## 2018-11-02 DIAGNOSIS — D509 Iron deficiency anemia, unspecified: Secondary | ICD-10-CM

## 2018-11-02 DIAGNOSIS — Z7689 Persons encountering health services in other specified circumstances: Secondary | ICD-10-CM | POA: Diagnosis not present

## 2018-11-02 DIAGNOSIS — N184 Chronic kidney disease, stage 4 (severe): Secondary | ICD-10-CM

## 2018-11-02 DIAGNOSIS — Z7901 Long term (current) use of anticoagulants: Secondary | ICD-10-CM

## 2018-11-02 NOTE — Telephone Encounter (Signed)
Best number [phone] [name] calling to see if you received lab results yet from [facility]

## 2018-11-02 NOTE — Telephone Encounter (Signed)
Best number [phone] EXT [phone] Tina @ [facility] is calling following up on pt PT/INR and bmp and cbc lab results  Please  Fax # [phone]  Atten Otila Kluver

## 2018-11-02 NOTE — Telephone Encounter (Signed)
Called and spoke with Christy x 2. Unfortunately, most recent INR/cbc/bmet results drawn by home health last Thursday still not available despite multiple calls to [facility] as well as Genella Rife ([place]). Will place orders to have labs redrawn Tuesday am car side.

## 2018-11-02 NOTE — Telephone Encounter (Signed)
There is obviously some confusion on the part of [facility]. I have called multiple times in an attempt to get lab results from them. I have left messages for Matilde Bash and Janett Billow today regarding the patient's labs.

## 2018-11-02 NOTE — Telephone Encounter (Signed)
[name] called back about message that was left this morning in regards to the labs. She stated that the doctor is waiting on these and would like them faxed as soon as possible   FAX# [phone]  ATTN: TINA

## 2018-11-02 NOTE — Telephone Encounter (Signed)
I see that an INR was resulted, but the other two lab orders are still in as future.

## 2018-11-03 ENCOUNTER — Other Ambulatory Visit: Payer: PPO

## 2018-11-03 ENCOUNTER — Telehealth: Payer: Self-pay

## 2018-11-03 ENCOUNTER — Other Ambulatory Visit (INDEPENDENT_AMBULATORY_CARE_PROVIDER_SITE_OTHER): Payer: PPO | Admitting: Family Medicine

## 2018-11-03 ENCOUNTER — Encounter: Payer: Self-pay | Admitting: Family Medicine

## 2018-11-03 ENCOUNTER — Other Ambulatory Visit (INDEPENDENT_AMBULATORY_CARE_PROVIDER_SITE_OTHER): Payer: PPO

## 2018-11-03 DIAGNOSIS — R531 Weakness: Secondary | ICD-10-CM

## 2018-11-03 DIAGNOSIS — I4891 Unspecified atrial fibrillation: Secondary | ICD-10-CM | POA: Diagnosis not present

## 2018-11-03 DIAGNOSIS — Z7901 Long term (current) use of anticoagulants: Secondary | ICD-10-CM | POA: Diagnosis not present

## 2018-11-03 DIAGNOSIS — N184 Chronic kidney disease, stage 4 (severe): Secondary | ICD-10-CM

## 2018-11-03 DIAGNOSIS — R600 Localized edema: Secondary | ICD-10-CM | POA: Diagnosis not present

## 2018-11-03 LAB — CBC WITH DIFFERENTIAL/PLATELET
Basophils Absolute: 0 10*3/uL (ref 0.0–0.1)
Basophils Relative: 0.8 % (ref 0.0–3.0)
Eosinophils Absolute: 0 10*3/uL (ref 0.0–0.7)
Eosinophils Relative: 1.2 % (ref 0.0–5.0)
HCT: 24 % — ABNORMAL LOW (ref 36.0–46.0)
Hemoglobin: 7.4 g/dL — CL (ref 12.0–15.0)
Lymphocytes Relative: 17.3 % (ref 12.0–46.0)
Lymphs Abs: 0.7 10*3/uL (ref 0.7–4.0)
MCHC: 30.6 g/dL (ref 30.0–36.0)
MCV: 68.2 fl — ABNORMAL LOW (ref 78.0–100.0)
Monocytes Absolute: 0.8 10*3/uL (ref 0.1–1.0)
Monocytes Relative: 21.5 % — ABNORMAL HIGH (ref 3.0–12.0)
Neutro Abs: 2.3 10*3/uL (ref 1.4–7.7)
Neutrophils Relative %: 59.2 % (ref 43.0–77.0)
Platelets: 291 10*3/uL (ref 150.0–400.0)
RBC: 3.52 Mil/uL — ABNORMAL LOW (ref 3.87–5.11)
RDW: 28.1 % — ABNORMAL HIGH (ref 11.5–15.5)
WBC: 3.9 10*3/uL — ABNORMAL LOW (ref 4.0–10.5)

## 2018-11-03 LAB — POCT INR: INR: 3.6 — AB (ref 2.0–3.0)

## 2018-11-03 LAB — BASIC METABOLIC PANEL
BUN: 21 mg/dL (ref 6–23)
CO2: 33 mEq/L — ABNORMAL HIGH (ref 19–32)
Calcium: 8.5 mg/dL (ref 8.4–10.5)
Chloride: 94 mEq/L — ABNORMAL LOW (ref 96–112)
Creatinine, Ser: 1.2 mg/dL (ref 0.40–1.20)
GFR: 42.94 mL/min — ABNORMAL LOW (ref >60.00)
Glucose, Bld: 106 mg/dL — ABNORMAL HIGH (ref 70–99)
Potassium: 3.3 mEq/L — ABNORMAL LOW (ref 3.5–5.1)
Sodium: 134 mEq/L — ABNORMAL LOW (ref 135–145)

## 2018-11-03 NOTE — Progress Notes (Signed)
Orders placed for stat labs.

## 2018-11-03 NOTE — Telephone Encounter (Signed)
Called and spoke with patient. I had received a call from [facility], clinical supervisor at [facility]. He said that [facility] has been unable to locate the patient's sample. I called the patient and she will come by the office for stat drive through labs (POCT INR, CBC with diff and BMET).

## 2018-11-03 NOTE — Telephone Encounter (Signed)
Received notice on critical lab for pt. Reviewing chart, looks like this is more chronic issue, pcp already aware.

## 2018-11-03 NOTE — Telephone Encounter (Signed)
Noted  

## 2018-11-03 NOTE — Telephone Encounter (Signed)
Received 2 messages from [facility] left on my phome from Monday. First message said they were escalating our concerns to several people to figure out what is happening with our patient. Second message was from [place] who said the patients lab work went to [facility] in [place] and they were trying to get the results from them and someone was on hold with [facility] and they would fax the results as soon as they get them. Jessica's phone number is [phone]

## 2018-11-03 NOTE — Telephone Encounter (Signed)
Called and spoke to [place] at [facility]. He was the nurse who went to the patient's home last night and drew her labs. He took them to [facility] lab and asked that they be run stat. He was told they would have to be taken to [facility] because he did not have a [facility] lab req. He has been calling to get results and will call me as soon as he obtains them. I have been in touch with Ms. Orzechowski's granddaughter, [name]. The patient was scheduled to have labs draw at our office at noon today. We will hold off for a couple of hours to hopefully be able to get results from labs last night and avoid her having to be transported from her home to the office.

## 2018-11-03 NOTE — Telephone Encounter (Signed)
[facility] reporting critical result Hemoglobin 7.4

## 2018-11-03 NOTE — Telephone Encounter (Signed)
Dulcy Fanny coordinator with [facility] left v/m requesting cb from Glenda Chroman FNP about recent labs drawn by [facility].

## 2018-11-04 ENCOUNTER — Other Ambulatory Visit: Payer: Self-pay

## 2018-11-04 ENCOUNTER — Emergency Department (HOSPITAL_COMMUNITY): Payer: PPO

## 2018-11-04 ENCOUNTER — Inpatient Hospital Stay (HOSPITAL_COMMUNITY)
Admission: EM | Admit: 2018-11-04 | Discharge: 2018-11-10 | DRG: 286 | Disposition: A | Discharge status: Home Health | Payer: PPO | Attending: Internal Medicine | Admitting: Internal Medicine

## 2018-11-04 ENCOUNTER — Encounter (HOSPITAL_COMMUNITY): Payer: Self-pay

## 2018-11-04 DIAGNOSIS — N189 Chronic kidney disease, unspecified: Secondary | ICD-10-CM | POA: Diagnosis not present

## 2018-11-04 DIAGNOSIS — I6529 Occlusion and stenosis of unspecified carotid artery: Secondary | ICD-10-CM | POA: Diagnosis present

## 2018-11-04 DIAGNOSIS — Z885 Allergy status to narcotic agent status: Secondary | ICD-10-CM

## 2018-11-04 DIAGNOSIS — I5033 Acute on chronic diastolic (congestive) heart failure: Secondary | ICD-10-CM | POA: Diagnosis present

## 2018-11-04 DIAGNOSIS — D62 Acute posthemorrhagic anemia: Secondary | ICD-10-CM | POA: Diagnosis present

## 2018-11-04 DIAGNOSIS — I130 Hypertensive heart and chronic kidney disease with heart failure and stage 1 through stage 4 chronic kidney disease, or unspecified chronic kidney disease: Secondary | ICD-10-CM | POA: Diagnosis not present

## 2018-11-04 DIAGNOSIS — M7989 Other specified soft tissue disorders: Secondary | ICD-10-CM

## 2018-11-04 DIAGNOSIS — Z95810 Presence of automatic (implantable) cardiac defibrillator: Secondary | ICD-10-CM | POA: Diagnosis not present

## 2018-11-04 DIAGNOSIS — N183 Chronic kidney disease, stage 3 (moderate): Secondary | ICD-10-CM | POA: Diagnosis not present

## 2018-11-04 DIAGNOSIS — I739 Peripheral vascular disease, unspecified: Secondary | ICD-10-CM | POA: Diagnosis present

## 2018-11-04 DIAGNOSIS — Z87891 Personal history of nicotine dependence: Secondary | ICD-10-CM | POA: Diagnosis not present

## 2018-11-04 DIAGNOSIS — I4821 Permanent atrial fibrillation: Secondary | ICD-10-CM | POA: Diagnosis not present

## 2018-11-04 DIAGNOSIS — Z7901 Long term (current) use of anticoagulants: Secondary | ICD-10-CM | POA: Diagnosis not present

## 2018-11-04 DIAGNOSIS — E876 Hypokalemia: Secondary | ICD-10-CM | POA: Diagnosis not present

## 2018-11-04 DIAGNOSIS — J9601 Acute respiratory failure with hypoxia: Secondary | ICD-10-CM | POA: Diagnosis not present

## 2018-11-04 DIAGNOSIS — I5032 Chronic diastolic (congestive) heart failure: Secondary | ICD-10-CM | POA: Diagnosis not present

## 2018-11-04 DIAGNOSIS — E785 Hyperlipidemia, unspecified: Secondary | ICD-10-CM | POA: Diagnosis not present

## 2018-11-04 DIAGNOSIS — I4891 Unspecified atrial fibrillation: Secondary | ICD-10-CM | POA: Diagnosis not present

## 2018-11-04 DIAGNOSIS — Z7951 Long term (current) use of inhaled steroids: Secondary | ICD-10-CM | POA: Diagnosis not present

## 2018-11-04 DIAGNOSIS — E7800 Pure hypercholesterolemia, unspecified: Secondary | ICD-10-CM | POA: Diagnosis not present

## 2018-11-04 DIAGNOSIS — J90 Pleural effusion, not elsewhere classified: Secondary | ICD-10-CM | POA: Diagnosis present

## 2018-11-04 DIAGNOSIS — N184 Chronic kidney disease, stage 4 (severe): Secondary | ICD-10-CM | POA: Diagnosis not present

## 2018-11-04 DIAGNOSIS — D649 Anemia, unspecified: Secondary | ICD-10-CM | POA: Diagnosis not present

## 2018-11-04 DIAGNOSIS — I270 Primary pulmonary hypertension: Secondary | ICD-10-CM

## 2018-11-04 DIAGNOSIS — Z8249 Family history of ischemic heart disease and other diseases of the circulatory system: Secondary | ICD-10-CM | POA: Diagnosis not present

## 2018-11-04 DIAGNOSIS — Z882 Allergy status to sulfonamides status: Secondary | ICD-10-CM

## 2018-11-04 DIAGNOSIS — R609 Edema, unspecified: Secondary | ICD-10-CM

## 2018-11-04 DIAGNOSIS — Z96643 Presence of artificial hip joint, bilateral: Secondary | ICD-10-CM | POA: Diagnosis not present

## 2018-11-04 DIAGNOSIS — R0602 Shortness of breath: Secondary | ICD-10-CM | POA: Diagnosis not present

## 2018-11-04 DIAGNOSIS — I272 Pulmonary hypertension, unspecified: Secondary | ICD-10-CM | POA: Diagnosis present

## 2018-11-04 DIAGNOSIS — Z20828 Contact with and (suspected) exposure to other viral communicable diseases: Secondary | ICD-10-CM | POA: Diagnosis not present

## 2018-11-04 DIAGNOSIS — I2722 Pulmonary hypertension due to left heart disease: Secondary | ICD-10-CM | POA: Diagnosis present

## 2018-11-04 DIAGNOSIS — J449 Chronic obstructive pulmonary disease, unspecified: Secondary | ICD-10-CM | POA: Diagnosis not present

## 2018-11-04 DIAGNOSIS — I4819 Other persistent atrial fibrillation: Secondary | ICD-10-CM | POA: Diagnosis not present

## 2018-11-04 DIAGNOSIS — Z79890 Hormone replacement therapy (postmenopausal): Secondary | ICD-10-CM

## 2018-11-04 DIAGNOSIS — E782 Mixed hyperlipidemia: Secondary | ICD-10-CM | POA: Diagnosis present

## 2018-11-04 DIAGNOSIS — Z905 Acquired absence of kidney: Secondary | ICD-10-CM

## 2018-11-04 DIAGNOSIS — Z85528 Personal history of other malignant neoplasm of kidney: Secondary | ICD-10-CM

## 2018-11-04 DIAGNOSIS — I2510 Atherosclerotic heart disease of native coronary artery without angina pectoris: Secondary | ICD-10-CM | POA: Diagnosis present

## 2018-11-04 DIAGNOSIS — Z79891 Long term (current) use of opiate analgesic: Secondary | ICD-10-CM | POA: Diagnosis not present

## 2018-11-04 DIAGNOSIS — D509 Iron deficiency anemia, unspecified: Secondary | ICD-10-CM | POA: Diagnosis present

## 2018-11-04 DIAGNOSIS — Z1159 Encounter for screening for other viral diseases: Secondary | ICD-10-CM

## 2018-11-04 DIAGNOSIS — I509 Heart failure, unspecified: Secondary | ICD-10-CM

## 2018-11-04 DIAGNOSIS — D631 Anemia in chronic kidney disease: Secondary | ICD-10-CM | POA: Diagnosis not present

## 2018-11-04 DIAGNOSIS — M1990 Unspecified osteoarthritis, unspecified site: Secondary | ICD-10-CM | POA: Diagnosis not present

## 2018-11-04 DIAGNOSIS — I110 Hypertensive heart disease with heart failure: Secondary | ICD-10-CM | POA: Diagnosis not present

## 2018-11-04 DIAGNOSIS — Z803 Family history of malignant neoplasm of breast: Secondary | ICD-10-CM | POA: Diagnosis not present

## 2018-11-04 DIAGNOSIS — Z8673 Personal history of transient ischemic attack (TIA), and cerebral infarction without residual deficits: Secondary | ICD-10-CM | POA: Diagnosis not present

## 2018-11-04 DIAGNOSIS — S72132D Displaced apophyseal fracture of left femur, subsequent encounter for closed fracture with routine healing: Secondary | ICD-10-CM | POA: Diagnosis not present

## 2018-11-04 DIAGNOSIS — I959 Hypotension, unspecified: Secondary | ICD-10-CM | POA: Diagnosis not present

## 2018-11-04 LAB — IRON AND TIBC
Iron: 16 ug/dL — ABNORMAL LOW (ref 28–170)
Saturation Ratios: 4 % — ABNORMAL LOW (ref 10.4–31.8)
TIBC: 414 ug/dL (ref 250–450)
UIBC: 398 ug/dL

## 2018-11-04 LAB — CBC WITH DIFFERENTIAL/PLATELET
Abs Immature Granulocytes: 0 10*3/uL (ref 0.00–0.07)
Basophils Absolute: 0 10*3/uL (ref 0.0–0.1)
Basophils Relative: 0 %
Eosinophils Absolute: 0 10*3/uL (ref 0.0–0.5)
Eosinophils Relative: 0 %
HCT: 28.5 % — ABNORMAL LOW (ref 36.0–46.0)
Hemoglobin: 7.8 g/dL — ABNORMAL LOW (ref 12.0–15.0)
Lymphocytes Relative: 12 %
Lymphs Abs: 0.6 10*3/uL — ABNORMAL LOW (ref 0.7–4.0)
MCH: 20.4 pg — ABNORMAL LOW (ref 26.0–34.0)
MCHC: 27.4 g/dL — ABNORMAL LOW (ref 30.0–36.0)
MCV: 74.4 fL — ABNORMAL LOW (ref 80.0–100.0)
Monocytes Absolute: 0.4 10*3/uL (ref 0.1–1.0)
Monocytes Relative: 8 %
Neutro Abs: 4.3 10*3/uL (ref 1.7–7.7)
Neutrophils Relative %: 80 %
Platelets: 298 10*3/uL (ref 150–400)
RBC: 3.83 MIL/uL — ABNORMAL LOW (ref 3.87–5.11)
RDW: 26.5 % — ABNORMAL HIGH (ref 11.5–15.5)
WBC: 5.4 10*3/uL (ref 4.0–10.5)
nRBC: 0 % (ref 0.0–0.2)
nRBC: 0 /100 WBC

## 2018-11-04 LAB — URINALYSIS, ROUTINE W REFLEX MICROSCOPIC
Bilirubin Urine: NEGATIVE
Glucose, UA: NEGATIVE mg/dL
Hgb urine dipstick: NEGATIVE
Ketones, ur: NEGATIVE mg/dL
Nitrite: NEGATIVE
Protein, ur: NEGATIVE mg/dL
Specific Gravity, Urine: 1.006 (ref 1.005–1.030)
pH: 8 (ref 5.0–8.0)

## 2018-11-04 LAB — BASIC METABOLIC PANEL
Anion gap: 11 (ref 5–15)
BUN: 19 mg/dL (ref 8–23)
CO2: 30 mmol/L (ref 22–32)
Calcium: 9 mg/dL (ref 8.9–10.3)
Chloride: 94 mmol/L — ABNORMAL LOW (ref 98–111)
Creatinine, Ser: 1.27 mg/dL — ABNORMAL HIGH (ref 0.44–1.00)
GFR calc Af Amer: 46 mL/min — ABNORMAL LOW (ref >60)
GFR calc non Af Amer: 39 mL/min — ABNORMAL LOW (ref >60)
Glucose, Bld: 101 mg/dL — ABNORMAL HIGH (ref 70–99)
Potassium: 3.7 mmol/L (ref 3.5–5.1)
Sodium: 135 mmol/L (ref 135–145)

## 2018-11-04 LAB — HEPATIC FUNCTION PANEL
ALT: 10 U/L (ref 0–44)
AST: 20 U/L (ref 15–41)
Albumin: 3 g/dL — ABNORMAL LOW (ref 3.5–5.0)
Alkaline Phosphatase: 90 U/L (ref 38–126)
Bilirubin, Direct: 0.4 mg/dL — ABNORMAL HIGH (ref 0.0–0.2)
Indirect Bilirubin: 0.9 mg/dL (ref 0.3–0.9)
Total Bilirubin: 1.3 mg/dL — ABNORMAL HIGH (ref 0.3–1.2)
Total Protein: 5.9 g/dL — ABNORMAL LOW (ref 6.5–8.1)

## 2018-11-04 LAB — BRAIN NATRIURETIC PEPTIDE: B Natriuretic Peptide: 369.3 pg/mL — ABNORMAL HIGH (ref 0.0–100.0)

## 2018-11-04 LAB — LACTIC ACID, PLASMA
Lactic Acid, Venous: 1.4 mmol/L (ref 0.5–1.9)
Lactic Acid, Venous: 1.3 mmol/L (ref 0.5–1.9)

## 2018-11-04 LAB — PREPARE RBC (CROSSMATCH)

## 2018-11-04 LAB — MAGNESIUM: Magnesium: 2.2 mg/dL (ref 1.7–2.4)

## 2018-11-04 LAB — FERRITIN: Ferritin: 35 ng/mL (ref 11–307)

## 2018-11-04 LAB — TROPONIN I (HIGH SENSITIVITY)
Troponin I (High Sensitivity): 18 ng/L — ABNORMAL HIGH (ref <18)
Troponin I (High Sensitivity): 27 ng/L — ABNORMAL HIGH (ref <18)

## 2018-11-04 LAB — PROTIME-INR
INR: 2.5 — ABNORMAL HIGH (ref 0.8–1.2)
Prothrombin Time: 26.8 seconds — ABNORMAL HIGH (ref 11.4–15.2)

## 2018-11-04 LAB — SARS CORONAVIRUS 2 BY RT PCR (HOSPITAL ORDER, PERFORMED IN ~~LOC~~ HOSPITAL LAB): SARS Coronavirus 2: NEGATIVE

## 2018-11-04 LAB — TSH: TSH: 3.682 u[IU]/mL (ref 0.350–4.500)

## 2018-11-04 MED ORDER — SODIUM CHLORIDE 0.9 % IV SOLN: 250.0000 mL | INTRAVENOUS | Status: DC | PRN

## 2018-11-04 MED ORDER — METOLAZONE 2.5 MG PO TABS: 2.5000 mg | ORAL_TABLET | Freq: Two times a day (BID) | ORAL | Status: DC

## 2018-11-04 MED ORDER — METOLAZONE 2.5 MG PO TABS
2.5000 mg | ORAL_TABLET | Freq: Once | ORAL | Status: AC
  Administered 2018-11-04: 2.5 mg via ORAL
  Filled 2018-11-04: qty 1

## 2018-11-04 MED ORDER — ZOLPIDEM TARTRATE 5 MG PO TABS
5.0000 mg | ORAL_TABLET | Freq: Every evening | ORAL | Status: DC | PRN
  Administered 2018-11-05 – 2018-11-06 (×2): 5 mg via ORAL
  Filled 2018-11-04 (×2): qty 1

## 2018-11-04 MED ORDER — ONDANSETRON HCL 4 MG/2ML IJ SOLN: 4.0000 mg | Freq: Four times a day (QID) | INTRAMUSCULAR | Status: DC | PRN

## 2018-11-04 MED ORDER — SODIUM CHLORIDE 0.9 % IV SOLN: 25.0000 mg | Freq: Once | INTRAVENOUS | Status: DC

## 2018-11-04 MED ORDER — FUROSEMIDE 10 MG/ML IJ SOLN
40.0000 mg | Freq: Once | INTRAMUSCULAR | Status: AC
  Administered 2018-11-04: 40 mg via INTRAVENOUS
  Filled 2018-11-04: qty 4

## 2018-11-04 MED ORDER — MAGNESIUM SULFATE 2 GM/50ML IV SOLN
2.0000 g | Freq: Once | INTRAVENOUS | Status: AC
  Administered 2018-11-04: 22:00:00 2 g via INTRAVENOUS
  Filled 2018-11-04: qty 50

## 2018-11-04 MED ORDER — SODIUM CHLORIDE 0.9% IV SOLUTION: Freq: Once | INTRAVENOUS | Status: DC

## 2018-11-04 MED ORDER — POTASSIUM CHLORIDE CRYS ER 20 MEQ PO TBCR
20.0000 meq | EXTENDED_RELEASE_TABLET | Freq: Two times a day (BID) | ORAL | Status: DC
  Administered 2018-11-04 – 2018-11-05 (×3): 20 meq via ORAL
  Filled 2018-11-04 (×3): qty 1

## 2018-11-04 MED ORDER — SODIUM CHLORIDE 0.9% FLUSH
3.0000 mL | Freq: Two times a day (BID) | INTRAVENOUS | Status: DC
  Administered 2018-11-05 – 2018-11-09 (×7): 3 mL via INTRAVENOUS

## 2018-11-04 MED ORDER — FUROSEMIDE 10 MG/ML IJ SOLN
80.0000 mg | Freq: Two times a day (BID) | INTRAMUSCULAR | Status: DC
  Administered 2018-11-05 – 2018-11-08 (×8): 80 mg via INTRAVENOUS
  Filled 2018-11-04 (×9): qty 8

## 2018-11-04 MED ORDER — ACETAMINOPHEN 325 MG PO TABS: 650.0000 mg | ORAL_TABLET | ORAL | Status: DC | PRN

## 2018-11-04 MED ORDER — SODIUM CHLORIDE 0.9 % IV SOLN
125.0000 mg | Freq: Once | INTRAVENOUS | Status: AC
  Administered 2018-11-05: 125 mg via INTRAVENOUS
  Filled 2018-11-04: qty 10

## 2018-11-04 MED ORDER — SODIUM CHLORIDE 0.9% FLUSH
3.0000 mL | INTRAVENOUS | Status: DC | PRN
  Administered 2018-11-05: 10:00:00 3 mL via INTRAVENOUS
  Filled 2018-11-04: qty 3

## 2018-11-04 MED ORDER — ALPRAZOLAM 0.25 MG PO TABS: 0.2500 mg | ORAL_TABLET | Freq: Two times a day (BID) | ORAL | Status: DC | PRN

## 2018-11-04 NOTE — Discharge Planning (Signed)
EDCM received call regarding permission to be support person and an update on grandmother's ED visit.  EDCM explained that I am unable to grant permission for support person and that I have relayed need for update to medical team caring for pt.  Granddaughter appreciative of call.    [name] [phone]

## 2018-11-04 NOTE — ED Provider Notes (Signed)
  Physical Exam  BP (!) 131/53 (BP Location: Left Arm)   Pulse (!) 54   Temp 98.5 F (36.9 C) (Oral)   Resp 19   Ht 5' 6.5" (1.689 m)   Wt 78.5 kg Comment: scale b  SpO2 95%   BMI 27.50 kg/m   Physical Exam  ED Course/Procedures     .Critical Care Performed by: Gareth [name], MD Authorized by: Gareth [name], MD   Critical care provider statement:    Critical care time (minutes):  30   Critical care was time spent personally by me on the following activities:  Discussions with consultants, evaluation of patient's response to treatment, examination of patient, ordering and performing treatments and interventions, ordering and review of laboratory studies, ordering and review of radiographic studies, pulse oximetry, re-evaluation of patient's condition, obtaining history from patient or surrogate and review of old charts    MDM  Received care of patient from Dr. Vallery Ridge.  Please see her note for prior history and physical exam.  Briefly this is an 85 year old female who presented with concern for Generalized weakness, fatigue, and dyspnea which has been persistent since her hip surgery.  Discussed history with patient, and called her primary care physician, who reports concerns of increasing signs of volume overload, as well as concern for symptomatic anemia without signs of improvement.  Discussed her care with both the granddaughter and daughter over the phone.  Of note, the daughter reports that the granddaughter is not to be contacted.  Discussed with Dr. Evangeline Gula, admitting doctor.  Agree that symptoms are consistent with symptomatic anemia and given patient's age and comorbidities, feel transfusion is indicated and will proceed with transfusion as well as continue diuresis in setting of increasing leg swelling and volume overload.  Patient transfused and admitted to the hospital for further care.   Gareth [name], MD 11/05/18 [phone]

## 2018-11-04 NOTE — ED Triage Notes (Signed)
Pt presents to ED after getting her blood test results from yesterday and was told to go to ED for blood transfuion.  Pt had left hip surgery early in June and received 2 units of blood at that time.  Pt reports having intermittent dizziness and sob.  Pt has hx of A-fib which causes her SOB as well.  Pt's VS are stable during triage.

## 2018-11-04 NOTE — ED Notes (Signed)
Report calledd to rn on 3e

## 2018-11-04 NOTE — Consult Note (Signed)
Cardiology Consultation:   Patient ID: [name] MRN: [id number]; DOB: 17-Aug-1933  Admit date: 11/04/2018 Date of Consult: 11/04/2018  Primary Care Provider: Elby Beck, FNP Primary Cardiologist: Dr Rockey Situ Primary Electrophysiologist:  Dr Caryl Comes   Patient Profile:   [name] is a 85 y.o. female with a hx of   symptomatic bradycardia, nonobstructive CAD by 2006 cath, chronic diastolic HF, carotid stenosis, persistent afib on coumadin, AICD placed in 2006 for primary prevention, renal cell CA s/p right kidney resection now with single kidney, CKD IV COPD. Also recent history of hip fracture with ORIF June 2020 with ongoing issues with anemia who is being seen today for the evaluation of CHF at the request of Dr Evangeline Gula.  History of Present Illness:   [name] 85 yo female history of symptomatic bradycardia, nonobstructive CAD by 2006 cath, chronic diastolic HF, carotid stenosis, persistent afib on coumadin, AICD placed in 2006 for primary prevention, renal cell CA s/p right kidney resection now with single kidney, CKD IV COPD. Also recent history of hip fracture with ORIF June 2020. Ongoing issues with anemia since her hip surgery 10/2018, admitted due to abnormal Hgb with her pcp and significant SOB and fatigue. Patient reports progressive fatigue since discharge from her hip surgery, along with worsening SOB/DOE, LE edema, and abdominal tightness  At appt 10/21/18 with Dr Rockey Situ, torsemide increased to 40mg  daily with metolazone 5mg  twice a week as opposed to once weekly.   INR 2.5 WBC 5.4 Hgb 7.8 Plt 298 Cr 1.27 BUN 19 BNP 369  hstrop 18 10/2018 echo: LVEF 60-65%, cannot eval diastolic function due to afib, normal RV function with mild enlargement, PASP 93 CXR small bilateral effusions EKG afib 10/07/2018 LE venous: no DVTs, pulsatile flow  Cath 02/2005 ASSESSMENT:  1.  Nonobstructive coronary disease.  2.  Mild pulmonary hypertension.  3.  Normal left ventricular  function by echocardiogram with septal      prominence but no evidence of significant left ventricular outflow tract      gradient at rest.  4.  Sinus brady.  5.  Multiple episodes of syncope.    Heart Pathway Score:     Past Medical History:  Diagnosis Date   Adjustment disorder with anxiety    Anginal pain ([place])    Arthritis    "some in my hands" (11/11/2012)   Atrial fibrillation (HCC)    Automatic implantable cardioverter-defibrillator in situ    Blood in stool    Carotid artery stenosis 09/2007   a. 09/2007: 60-79% bilateral (stable); b. 10/2008: 40-59% R 60-79%    Chest pain, unspecified    Chronic airway obstruction, not elsewhere classified    Chronic bronchitis ([place])    "get it some; not q year" (11/11/2012)   Chronic diastolic CHF (congestive heart failure) ([place]) 06/04/2013   Chronic kidney disease, unspecified    Coronary artery disease    non-obstructive by 2006 cath   Frequent UTI    "get them a couple times/yr" (11/11/2012)   Hemorrhage of rectum and anus    High cholesterol    History of blood transfusion 04/2011   "after hip OR" (11/11/2012)   Hypertension 05/20/2011   Hypertr obst cardiomyop    Hypotension, unspecified    cardiac cath 2006..nonobstructive CAD 30-40s lesions.Marland KitchenETT 1/09 nondiagnostic due to poor HR response..Right Renal Cancer 2003   Long term (current) use of anticoagulants    Malignant neoplasm of kidney, except pelvis    Osteoarthritis of right hip  Other and unspecified coagulation defects    PONV (postoperative nausea and vomiting)    Renal cancer ([place]) 06/2001   Right   Secondary cardiomyopathy, unspecified    Sinus bradycardia    Urge incontinence     Past Surgical History:  Procedure Laterality Date   ABDOMINAL HYSTERECTOMY  1975   for benign causes   APPENDECTOMY     BI-VENTRICULAR PACEMAKER UPGRADE  05/04/2010   CARDIAC CATHETERIZATION  2006   CARDIOVERSION N/A 02/20/2018   Procedure:  CARDIOVERSION;  Surgeon: Minna Merritts, MD;  Location: [facility];  Service: Cardiovascular;  Laterality: N/A;   CARDIOVERSION N/A 03/27/2018   Procedure: CARDIOVERSION;  Surgeon: Minna Merritts, MD;  Location: [facility];  Service: Cardiovascular;  Laterality: N/A;   CATARACT EXTRACTION W/ INTRAOCULAR LENS  IMPLANT, BILATERAL  01/2006-02-2006   CHOLECYSTECTOMY N/A 11/11/2012   Procedure: LAPAROSCOPIC CHOLECYSTECTOMY WITH INTRAOPERATIVE CHOLANGIOGRAM;  Surgeon: Imogene Burn. Georgette Dover, MD;  Location: [facility];  Service: General;  Laterality: N/A;   EP IMPLANTABLE DEVICE N/A 02/21/2016   Procedure: ICD Generator Changeout;  Surgeon: Deboraha Sprang, MD;  Location: [facility];  Service: Cardiovascular;  Laterality: N/A;   INSERT / REPLACE / REMOVE PACEMAKER  05-01-11   02-28-05-/05-04-10-ICD-MEDTRONIC MAXIMAL DR   JOINT REPLACEMENT     LAPAROSCOPIC CHOLECYSTECTOMY  11/11/2012   LAPAROSCOPIC LYSIS OF ADHESIONS N/A 11/11/2012   Procedure: LAPAROSCOPIC LYSIS OF ADHESIONS;  Surgeon: Imogene Burn. Georgette Dover, MD;  Location: [facility];  Service: General;  Laterality: N/A;   NEPHRECTOMY Right 06/2001    S/P RENAL CELL CANCER   TOTAL HIP ARTHROPLASTY Right 05/03/2011   Procedure: TOTAL HIP ARTHROPLASTY ANTERIOR APPROACH;  Surgeon: Mcarthur Rossetti;  Location: [facility];  Service: Orthopedics;  Laterality: Right;  Removal of Cannulated Screws Right Hip, Right Direct Anterior Hip Replacement   TOTAL HIP ARTHROPLASTY Left 10/09/2018   Procedure: TOTAL HIP ARTHROPLASTY ANTERIOR APPROACH;  Surgeon: Rod Can, MD;  Location: [facility];  Service: Orthopedics;  Laterality: Left;     Inpatient Medications: Scheduled Meds:  sodium chloride   Intravenous Once   furosemide  80 mg Intravenous BID   metolazone  2.5 mg Oral BID   potassium chloride  20 mEq Oral BID   sodium chloride flush  3 mL Intravenous Q12H   Continuous Infusions:  sodium chloride     [START ON 11/05/2018] ferric gluconate  (FERRLECIT/NULECIT) IV     [START ON 11/05/2018] ferric gluconate (FERRLECIT/NULECIT) Test Dose     magnesium sulfate bolus IVPB     PRN Meds: sodium chloride, acetaminophen, ALPRAZolam, ondansetron (ZOFRAN) IV, sodium chloride flush, zolpidem  Allergies:    Allergies  Allergen Reactions   Codeine Nausea And Vomiting   Morphine And Related Nausea And Vomiting   Sulfonamide Derivatives Other (See Comments)    Dry mouth    Social History:   Social History   Socioeconomic History   Marital status: Widowed    Spouse name: Not on file   Number of children: Not on file   Years of education: Not on file   Highest education level: Not on file  Occupational History   Occupation: Retired    Fish farm manager: RETIRED  Scientist, product/process development strain: Not on file   Food insecurity    Worry: Not on file    Inability: Not on file   Transportation needs    Medical: Not on file    Non-medical: Not on file  Tobacco Use   Smoking status:  Former Smoker    Packs/day: 0.50    Years: 40.00    Pack years: 20.00    Types: Cigarettes    Quit date: 05/06/2001    Years since quitting: 17.5   Smokeless tobacco: Never Used  Substance and Sexual Activity   Alcohol use: No    Alcohol/week: 0.0 standard drinks   Drug use: No   Sexual activity: Never  Lifestyle   Physical activity    Days per week: Not on file    Minutes per session: Not on file   Stress: Not on file  Relationships   Social connections    Talks on phone: Not on file    Gets together: Not on file    Attends religious service: Not on file    Active member of club or organization: Not on file    Attends meetings of clubs or organizations: Not on file    Relationship status: Not on file   Intimate partner violence    Fear of current or ex partner: Not on file    Emotionally abused: Not on file    Physically abused: Not on file    Forced sexual activity: Not on file  Other Topics Concern   Not on  file  Social History Narrative   WIDOW   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   ALCOHOL USE-NO   RETIRED   FORMER TOBACCO USE.....1/2 PACK X 67 YRS UNTIL 2003   Would desire CPR    Family History:    Family History  Problem Relation Age of Onset   Heart failure Mother    Breast cancer Maternal Aunt 82   Breast cancer Cousin    Breast cancer Other    Colon cancer Neg Hx      ROS:  Please see the history of present illness.  All other ROS reviewed and negative.     Physical Exam/Data:   Vitals:   11/04/18 1645 11/04/18 1715 11/04/18 1745 11/04/18 1832  BP: (!) 124/43 (!) 133/56 (!) 141/56 (!) 154/55  Pulse: 83 72 82 96  Resp: (!) 28 (!) 23 (!) 27 20  Temp:    (!) 97.3 F (36.3 C)  TempSrc:    Oral  SpO2: 90% 92% 94% 92%  Weight:    78.4 kg  Height:    5' 6.5" (1.689 m)   No intake or output data in the 24 hours ending 11/04/18 1845 Last 3 Weights 11/04/2018 11/04/2018 10/09/2018  Weight (lbs) 172 lb 12.8 oz 176 lb 169 lb 8.5 oz  Weight (kg) 78.382 kg 79.833 kg 76.9 kg     Body mass index is 27.47 kg/m.  General:  Well nourished, well developed, in no acute distress HEENT: normal Lymph: no adenopathy Neck: elevated JVD Endocrine:  No thryomegaly Vascular: No carotid bruits; FA pulses 2+ bilaterally without bruits  Cardiac:  Irreg, 2/6 systolic murmur rusb Lungs:  Mild bilateral crackles  Abd: soft, nontender, no hepatomegaly  Ext: 3+ bilateral LE edema Musculoskeletal:  No deformities, BUE and BLE strength normal and equal Skin: warm and dry  Neuro:  CNs 2-12 intact, no focal abnormalities noted Psych:  Normal affect    Laboratory Data:  High Sensitivity Troponin:   Recent Labs  Lab 11/04/18 1715  TROPONINIHS 18*     Cardiac EnzymesNo results for input(s): TROPONINI in the last 168 hours.  No results for input(s): TROPIPOC in the last  168 hours.  Chemistry Recent Labs  Lab 11/03/18 1353 11/04/18 1322  NA 134* 135  K 3.3* 3.7  CL 94* 94*  CO2 33* 30  GLUCOSE 106* 101*  BUN 21 19  CREATININE 1.20 1.27*  CALCIUM 8.5 9.0  GFRNONAA  --  39*  GFRAA  --  46*  ANIONGAP  --  11    Recent Labs  Lab 11/04/18 1715  PROT 5.9*  ALBUMIN 3.0*  AST 20  ALT 10  ALKPHOS 90  BILITOT 1.3*   Hematology Recent Labs  Lab 11/03/18 1353 11/04/18 1322  WBC 3.9* 5.4  RBC 3.52* 3.83*  HGB 7.4 Repeated and verified X2.* 7.8*  HCT 24.0* 28.5*  MCV 68.2* 74.4*  MCH  --  20.4*  MCHC 30.6 27.4*  RDW 28.1* 26.5*  PLT 291.0 298   BNP Recent Labs  Lab 11/04/18 1715  BNP 369.3*    DDimer No results for input(s): DDIMER in the last 168 hours.   Radiology/Studies:  Dg Chest Port 1 View  Result Date: 11/04/2018 CLINICAL DATA:  Shortness of breath EXAM: PORTABLE CHEST 1 VIEW COMPARISON:  October 10, 2018 FINDINGS: There is a stable multi lead left-sided pacemaker. The heart size is stable but enlarged. There are persistent small bilateral pleural effusions with adjacent bibasilar airspace opacities favored to represent atelectasis. Aortic calcifications are noted. There is no pneumothorax. There is a partially visualized sclerotic lesion in the proximal left humerus. There are few calcified granulomas overlying the right lower lobe. IMPRESSION: 1. Persistent bilateral small pleural effusions. 2. Persistent bibasilar airspace opacities favored to represent atelectasis with infiltrate not entirely excluded. Electronically Signed   By: Constance Holster M.D.   On: 11/04/2018 15:32   Vas [place] Lower Extremity Venous (dvt) (only [facility])  Result Date: 11/04/2018  Lower Venous Study Indications: Swelling, and Edema.  Limitations: Body habitus and poor ultrasound/tissue interface. Comparison Study: Prior study done 10/07/18-negative Performing Technologist: Abram Sander RVS  Examination Guidelines: A complete evaluation includes B-mode imaging,  spectral Doppler, color Doppler, and power Doppler as needed of all accessible portions of each vessel. Bilateral testing is considered an integral part of a complete examination. Limited examinations for reoccurring indications may be performed as noted.  +---------+---------------+---------+-----------+----------+--------------+  RIGHT     Compressibility Phasicity Spontaneity Properties Summary         +---------+---------------+---------+-----------+----------+--------------+  CFV       Full            Yes       Yes                                    +---------+---------------+---------+-----------+----------+--------------+  SFJ       Full                                                             +---------+---------------+---------+-----------+----------+--------------+  FV Prox   Full                                                             +---------+---------------+---------+-----------+----------+--------------+  FV Mid    Full                                                             +---------+---------------+---------+-----------+----------+--------------+  FV Distal Full                                                             +---------+---------------+---------+-----------+----------+--------------+  PFV       Full                                                             +---------+---------------+---------+-----------+----------+--------------+  POP       Full            Yes       Yes                                    +---------+---------------+---------+-----------+----------+--------------+  PTV       Full                                                             +---------+---------------+---------+-----------+----------+--------------+  PERO                                                       Not visualized  +---------+---------------+---------+-----------+----------+--------------+   +---------+---------------+---------+-----------+----------+--------------+  LEFT       Compressibility Phasicity Spontaneity Properties Summary         +---------+---------------+---------+-----------+----------+--------------+  CFV       Full            Yes       Yes                                    +---------+---------------+---------+-----------+----------+--------------+  SFJ       Full                                                             +---------+---------------+---------+-----------+----------+--------------+  FV Prox   Full                                                             +---------+---------------+---------+-----------+----------+--------------+  FV Mid                    Yes       Yes                                    +---------+---------------+---------+-----------+----------+--------------+  FV Distal                 Yes       Yes                                    +---------+---------------+---------+-----------+----------+--------------+  PFV       Full                                                             +---------+---------------+---------+-----------+----------+--------------+  POP       Full            Yes       Yes                                    +---------+---------------+---------+-----------+----------+--------------+  PTV                                                        Not visualized  +---------+---------------+---------+-----------+----------+--------------+  PERO                                                       Not visualized  +---------+---------------+---------+-----------+----------+--------------+     Summary: Right: There is no evidence of deep vein thrombosis in the lower extremity. No cystic structure found in the popliteal fossa. Left: There is no evidence of deep vein thrombosis in the lower extremity. However, portions of this examination were limited- see technologist comments above. No cystic structure found in the popliteal fossa.  *See table(s) above for measurements and observations.    Preliminary       HEMODYNAMIC RESULTS:  Right atrial mean of 3, RV 40/0, PA 37/11 with a mean  of 25. Pulmonary capillary wedge was a mean of 11. Central aortic pressure  was 182/57 with a mean of 100. LV pressure was 174/1 with LVEDP of 16. There  was no evidence of aortic stenosis as pigtail crossed easily over the aortic  valve and there was no gradient on pullback.   PA saturations were 64% and SVC saturation was 66%. Femoral artery  saturation was 92%. Fick cardiac output was 4.5 liters per minute. Fick  cardiac index was 2.4 liters per minute per meter square. Pulmonary vascular  assistance was 3 Woods units.   Left main was normal.   LAD was a long vessel wrapping the apex. It gave off a single small diagonal  in the mid  section. In the proximal portion of the LAD, there was a 40% cyst  tubular lesion. In the midportion after the takeoff of diagonal, there was a  30% lesion associated with a small intramyocardial bridge.   Left circumflex was moderate-sized vessel that gave off a small ramus and  moderate-sized branching OM1. The AV groove circumflex was tiny. There was a  40% lesion in the mid left circumflex and some luminal irregularities in the  OM1.   RCA was a moderate-sized dominant vessel. It gave a moderate-sized PDA and a  small PL. There was a 30% proximal lesion in the RCA and a 40% midlesion.   Left ventriculogram was not done due to her chronic renal insufficiency and  presence of only one kidney. I have reviewed her echocardiogram before the  procedure. The ejection fraction was normal. There was some mild septal  prominence without significant LV outflow gradient. The aortic valve  appeared thickened but did not appear significantly stenotic.   ASSESSMENT:  1.  Nonobstructive coronary disease.  2.  Mild pulmonary hypertension.  3.  Normal left ventricular function by echocardiogram with septal      prominence but no evidence of significant left ventricular  outflow tract      gradient at rest.  4.  Sinus brady.  5.  Multiple episodes of syncope.     Assessment and Plan:   1. Acute on chronic diastolic HF - complicated by severe pulm HTN and probable RV dysfunciton and TR, perhaps some component of high output HF from ongoing issues with anemia as well - failure with home diuretics torsemide 40mg  daily with metolazone 5mg  twice a week - follow with IV lasix 80mg  bid today with one dose of metolazone 2.5mg . Reevaluate response tomorrow.    2. Persistent afib - from EP notes disopyramide ineffective, side effects to amio, flecanide, and tikasyn. Multiple prior cardioversions - has not been on rate controlling agent, appears she has not required - coumadin held due to anemia  3. Severe pulm HTN - echo 10/2018 PASP 93, mild RV enlargement with reported normal function (TAPSE 2.3, RV s' 11), mild to mod TR but poor color Doppler and TR is likely underestimated, there is no hepatic vein doppler to look for reversal.   - 2006 RHC: mean PA 25, PCWP 11, PVR 3 woods units CI 2.4  - known COPD and diastolic HF could be contributing. Normal LFTs essentially. 10/07/18 CTA without PE, defer to CHF team whether to consider VQ. Never tested for OSA - No HIV/ANA/RF test in system (add on labs tomorrow).   - she likely would benefit from a RHC to clarify her PA pressures, resistance, and left sided pressures. Defer to CHF team, will make her npo in case want to do tomorrow before holiday weekend starts Friday.   3. Anemia Workup by primary team, hold coumadin at this time.   4. CKD - baseline Cr around 1.2-1.6 - single kidney s/p prior nephrectomy - monitor closely with diuresis     For questions or updates, please contact [place] Please consult [email or web address] for contact info under     Signed, Carlyle Dolly, MD  11/04/2018 6:45 PM

## 2018-11-04 NOTE — Telephone Encounter (Signed)
Attempted to return call. No answer. Left message.

## 2018-11-04 NOTE — ED Notes (Signed)
Unsuccessful attempt to give report  They will call me

## 2018-11-04 NOTE — ED Notes (Addendum)
[name] [id number] [name] [phone]  Two daughters would like to be called with any updates and discharge

## 2018-11-04 NOTE — ED Notes (Signed)
Admitting at   The bedside 

## 2018-11-04 NOTE — Progress Notes (Signed)
Lower extremity venous has been completed.   Preliminary results in CV Proc.   Abram Sander 11/04/2018 3:53 PM

## 2018-11-04 NOTE — ED Notes (Signed)
Radiology at bedside for CXR

## 2018-11-04 NOTE — ED Provider Notes (Signed)
[facility] EMERGENCY DEPARTMENT Provider Note   CSN: [id number] Arrival date & time: 11/04/18  1229     History   Chief Complaint Chief Complaint  Patient presents with  . Anemia    HPI [name] is a 85 y.o. female.     HPI Patient is status post a left hip surgery in early June and has been anemic since that time.  She has required blood transfusion.  She was recommended to the emergency department due to low blood count.  Patient reports she has been feeling dizzy and short of breath.  Reports she has been fatigued.  She reports she has been having a hard time doing rehabilitation activities.  She reports that her rehab did get delayed.  She is getting a lot of swelling in her legs and reports that she has pain behind the calf and the lower leg on the left side.  Any fever.  He denies productive cough. Past Medical History:  Diagnosis Date  . Adjustment disorder with anxiety   . Anginal pain ([place])   . Arthritis    "some in my hands" (11/11/2012)  . Atrial fibrillation ([place])   . Automatic implantable cardioverter-defibrillator in situ   . Blood in stool   . Carotid artery stenosis 09/2007   a. 09/2007: 60-79% bilateral (stable); b. 10/2008: 40-59% R 60-79%   . Chest pain, unspecified   . Chronic airway obstruction, not elsewhere classified   . Chronic bronchitis ([place])    "get it some; not q year" (11/11/2012)  . Chronic diastolic CHF (congestive heart failure) ([place]) 06/04/2013  . Chronic kidney disease, unspecified   . Coronary artery disease    non-obstructive by 2006 cath  . Frequent UTI    "get them a couple times/yr" (11/11/2012)  . Hemorrhage of rectum and anus   . High cholesterol   . History of blood transfusion 04/2011   "after hip OR" (11/11/2012)  . Hypertension 05/20/2011  . Hypertr obst cardiomyop   . Hypotension, unspecified    cardiac cath 2006..nonobstructive CAD 30-40s lesions.Marland KitchenETT 1/09 nondiagnostic due to poor HR response..Right  Renal Cancer 2003  . Long term (current) use of anticoagulants   . Malignant neoplasm of kidney, except pelvis   . Osteoarthritis of right hip   . Other and unspecified coagulation defects   . PONV (postoperative nausea and vomiting)   . Renal cancer ([place]) 06/2001   Right  . Secondary cardiomyopathy, unspecified   . Sinus bradycardia   . Urge incontinence     Patient Active Problem List   Diagnosis Date Noted  . Shortness of breath 10/21/2018  . Displaced fracture of left femoral neck ([place]) 10/09/2018  . Somnolence   . Closed left hip fracture ([place]) 10/07/2018  . Fall   . Hypokalemia   . Afib ([place]) 06/01/2018  . Urinary frequency 09/18/2017  . Iliac artery stenosis, bilateral ([place]) 07/14/2017  . Recurrent UTI 07/14/2017  . Long term (current) use of anticoagulants 05/08/2017  . Anemia 11/18/2016  . Dysuria 11/18/2016  . Pulmonary hypertension ([place]) 12/04/2015  . B12 deficiency 05/22/2015  . PAD (peripheral artery disease) ([place])   . Elevated serum creatinine   . Renal cell carcinoma ([place])   . S/p nephrectomy   . TIA (transient ischemic attack) 08/16/2014  . Weakness 01/14/2014  . Chronic kidney disease (CKD), stage IV (severe) ([place]) 12/08/2013  . Preoperative cardiovascular examination 06/07/2013  . Chronic diastolic CHF (congestive heart failure) ([place]) 06/04/2013  . Mitral  valve regurgitation 06/04/2013  . Allergic rhinitis 01/01/2013  . Chronic cholecystitis with calculus 09/15/2012  . Claudication ([place]) 01/21/2012  . Dizziness 11/26/2011  . Hypertension 05/20/2011  . Avascular necrosis of hip ([place]) 05/03/2011  . OTHER AND UNSPECIFIED COAGULATION DEFECTS 10/13/2009  . RECTAL BLEEDING 10/13/2009  . HOCM / IHSS 07/14/2009  . EDEMA 07/14/2009  . Hyperlipidemia 04/10/2009  . ADJUSTMENT DISORDER WITH ANXIETY 04/10/2009  . INCONTINENCE, URGE 04/10/2009  . Atrial fibrillation ([place]) 10/27/2008  . SINUS BRADYCARDIA 10/27/2008  . Carotid artery stenosis 10/27/2008  .  COPD (chronic obstructive pulmonary disease) ([place]) 10/27/2008  . ICD (implantable cardioverter-defibrillator) in place 10/27/2008    Past Surgical History:  Procedure Laterality Date  . ABDOMINAL HYSTERECTOMY  1975   for benign causes  . APPENDECTOMY    . BI-VENTRICULAR PACEMAKER UPGRADE  05/04/2010  . CARDIAC CATHETERIZATION  2006  . CARDIOVERSION N/A 02/20/2018   Procedure: CARDIOVERSION;  Surgeon: Minna Merritts, MD;  Location: [facility];  Service: Cardiovascular;  Laterality: N/A;  . CARDIOVERSION N/A 03/27/2018   Procedure: CARDIOVERSION;  Surgeon: Minna Merritts, MD;  Location: [facility];  Service: Cardiovascular;  Laterality: N/A;  . CATARACT EXTRACTION W/ INTRAOCULAR LENS  IMPLANT, BILATERAL  01/2006-02-2006  . CHOLECYSTECTOMY N/A 11/11/2012   Procedure: LAPAROSCOPIC CHOLECYSTECTOMY WITH INTRAOPERATIVE CHOLANGIOGRAM;  Surgeon: Imogene Burn. Georgette Dover, MD;  Location: [facility];  Service: General;  Laterality: N/A;  . EP IMPLANTABLE DEVICE N/A 02/21/2016   Procedure: ICD Generator Changeout;  Surgeon: Deboraha Sprang, MD;  Location: [facility];  Service: Cardiovascular;  Laterality: N/A;  . INSERT / REPLACE / REMOVE PACEMAKER  05-01-11   02-28-05-/05-04-10-ICD-MEDTRONIC MAXIMAL DR  . JOINT REPLACEMENT    . LAPAROSCOPIC CHOLECYSTECTOMY  11/11/2012  . LAPAROSCOPIC LYSIS OF ADHESIONS N/A 11/11/2012   Procedure: LAPAROSCOPIC LYSIS OF ADHESIONS;  Surgeon: Imogene Burn. Georgette Dover, MD;  Location: [facility];  Service: General;  Laterality: N/A;  . NEPHRECTOMY Right 06/2001    S/P RENAL CELL CANCER  . TOTAL HIP ARTHROPLASTY Right 05/03/2011   Procedure: TOTAL HIP ARTHROPLASTY ANTERIOR APPROACH;  Surgeon: Mcarthur Rossetti;  Location: [facility];  Service: Orthopedics;  Laterality: Right;  Removal of Cannulated Screws Right Hip, Right Direct Anterior Hip Replacement  . TOTAL HIP ARTHROPLASTY Left 10/09/2018   Procedure: TOTAL HIP ARTHROPLASTY ANTERIOR APPROACH;  Surgeon: Rod Can, MD;  Location: [facility];   Service: Orthopedics;  Laterality: Left;     OB History   No obstetric history on file.      Home Medications    Prior to Admission medications   Medication Sig Start Date End Date Taking? Authorizing Provider  acetaminophen (TYLENOL) 325 MG tablet Take 2 tablets (650 mg total) by mouth every 6 (six) hours as needed for mild pain or moderate pain (or Fever >/= 101). 10/12/18   Aline August, MD  bisacodyl (DULCOLAX) 5 MG EC tablet Take 1 tablet (5 mg total) by mouth daily as needed for moderate constipation. 10/12/18   Aline August, MD  cetirizine (ZYRTEC) 10 MG tablet Take 10 mg by mouth daily.     [provider]  conjugated estrogens (PREMARIN) vaginal cream Place 1 Applicatorful vaginally daily. 07/31/18   Elby Beck, FNP  CRANBERRY PO Take 4 tablets by mouth daily.    [provider]  docusate sodium (COLACE) 100 MG capsule Take 1 capsule (100 mg total) by mouth 2 (two) times daily. 10/12/18   Aline August, MD  ezetimibe (ZETIA) 10 MG tablet Take 1  tablet (10 mg total) by mouth daily. 08/18/17   Minna Merritts, MD  fluticasone (FLONASE) 50 MCG/ACT nasal spray Place 2 sprays into both nostrils daily. 04/22/18   Elby Beck, FNP  hydrocortisone 2.5 % cream Apply 1 application topically daily as needed (itching).  02/04/18   [provider]  levothyroxine (SYNTHROID) 25 MCG tablet Take 1 tablet (25 mcg total) by mouth daily before breakfast. 09/22/18   Deboraha Sprang, MD  magnesium oxide (MAG-OX) 400 MG tablet Take 1 tablet (400 mg total) by mouth daily. 06/05/18   Deboraha Sprang, MD  metolazone (ZAROXOLYN) 5 MG tablet Take 1 tablet (5 mg total) by mouth 2 (two) times a week for 30 days. Take 5 mg twice a week 10/22/18 11/21/18  Minna Merritts, MD  potassium chloride SA (K-DUR) 20 MEQ tablet Take 1 tablet (20 meq) by mouth twice daily, except on metolazone days take 2 tablets (40 meq) twice daily 10/02/18   Deboraha Sprang, MD  rosuvastatin  (CRESTOR) 40 MG tablet Take 1 tablet (40 mg total) by mouth daily. 11/11/16   Minna Merritts, MD  torsemide (DEMADEX) 20 MG tablet Take 2 tablets (40 mg) by mouth once daily. 08/25/18   Deboraha Sprang, MD  warfarin (COUMADIN) 2 MG tablet Take 1 tablet (2 mg total) by mouth as directed. Take 2 mg on Monday- Saturday and 1 Mg on Sunday 10/12/18   Aline August, MD    Family History Family History  Problem Relation Age of Onset  . Heart failure Mother   . Breast cancer Maternal Aunt 82  . Breast cancer Cousin   . Breast cancer Other   . Colon cancer Neg Hx     Social History Social History   Tobacco Use  . Smoking status: Former Smoker    Packs/day: 0.50    Years: 40.00    Pack years: 20.00    Types: Cigarettes    Quit date: 05/06/2001    Years since quitting: 17.5  . Smokeless tobacco: Never Used  Substance Use Topics  . Alcohol use: No    Alcohol/week: 0.0 standard drinks  . Drug use: No     Allergies   Codeine, Morphine and related, and Sulfonamide derivatives   Review of Systems Review of Systems 10 Systems reviewed and are negative for acute change except as noted in the HPI.   Physical Exam Updated Vital Signs BP (!) 161/55 (BP Location: Right Arm)   Pulse 76   Temp 98.2 F (36.8 C) (Oral)   Resp (!) 25   Ht 5' 6.5" (1.689 m)   Wt 79.8 kg   SpO2 97%   BMI 27.98 kg/m   Physical Exam Constitutional:      Comments: Patient is alert and nontoxic.  She appears to have mild increased work of breathing at rest.  Patient is pale and deconditioned in appearance.  HENT:     Head: Normocephalic and atraumatic.     Mouth/Throat:     Pharynx: Oropharynx is clear.  Cardiovascular:     Rate and Rhythm: Normal rate and regular rhythm.  Pulmonary:     Comments: Increased work of breathing.  Sounds are soft to the bases.  No gross crackle or wheeze. Abdominal:     General: There is no distension.     Palpations: Abdomen is soft.     Tenderness: There is no  abdominal tenderness. There is no guarding.  Musculoskeletal:     Comments: 2+  pitting edema bilateral lower extremities.  This extends up behind the popliteal fossa into the posterior thighs.  Patient's incision on the left is clean dry and intact.  She does have some diffuse erythema over the lateral thigh where there is dependent edema.  Skin:    General: Skin is warm and dry.     Coloration: Skin is pale.  Neurological:     General: No focal deficit present.     Mental Status: She is oriented to person, place, and time.     Coordination: Coordination normal.  Psychiatric:        Mood and Affect: Mood normal.      ED Treatments / Results  Labs (all labs ordered are listed, but only abnormal results are displayed) Labs Reviewed  PROTIME-INR - Abnormal; Notable for the following components:      Result Value   Prothrombin Time 26.8 (*)    INR 2.5 (*)    All other components within normal limits  CBC WITH DIFFERENTIAL/PLATELET - Abnormal; Notable for the following components:   RBC 3.83 (*)    Hemoglobin 7.8 (*)    HCT 28.5 (*)    MCV 74.4 (*)    MCH 20.4 (*)    MCHC 27.4 (*)    RDW 26.5 (*)    All other components within normal limits  BASIC METABOLIC PANEL - Abnormal; Notable for the following components:   Chloride 94 (*)    Glucose, Bld 101 (*)    Creatinine, Ser 1.27 (*)    GFR calc non Af Amer 39 (*)    GFR calc Af Amer 46 (*)    All other components within normal limits  TYPE AND SCREEN    EKG None  Radiology No results found.  Procedures Procedures (including critical care time)  Medications Ordered in ED Medications - No data to display   Initial Impression / Assessment and Plan / ED Course  I have reviewed the triage vital signs and the nursing notes.  Pertinent labs & imaging results that were available during my care of the patient were reviewed by me and considered in my medical decision making (see chart for details).        Patient has  persistent anemia.  She has fatigue, shortness of breath and general weakness.  She does have significant peripheral edema. DVT studies negative. Patient appears volume overloaded but with symptomatic anemia. Pending results will likely need admission. Dr. Billy Fischer to review and final dispo.   Final Clinical Impressions(s) / ED Diagnoses   Final diagnoses:  None    ED Discharge Orders    None       Charlesetta Shanks, MD 11/04/18 1627

## 2018-11-04 NOTE — ED Notes (Signed)
02 sats in the high 80s or low 90s  Nasal 02 at 2 liters

## 2018-11-04 NOTE — ED Notes (Signed)
Pure wick placed after lasix given

## 2018-11-04 NOTE — H&P (Signed)
History and Physical    CIAIRA NATIVIDAD [name]:[id number] DOB: [birth year] DOA: 11/04/2018  PCP: Elby Beck, FNP  Patient coming from: Home  I have personally briefly reviewed patient's old medical records in [place]  Chief Complaint: Low blood count on blood work done by primary doctor.  Referred here for evaluation.  HPI: [name] is a 85 y.o. female with medical history significant of chronic diastolic heart failure with AICD in place, atrial fibrillation on warfarin, chronic kidney disease stage III, coronary artery disease, TIA, chronic anemia with a baseline hemoglobin of 11 but recent hemoglobin much lower than that, renal cell carcinoma treated in the early 2000's with nephrectomy now with a single kidney, and recent left hip fracture status post ORIF in early June 2020 who presents today at the behest of Clarene Reamer, FNP due to anemia.  Patient has been anemic since her left hip surgery in early June.  She has received a blood transfusion.  She was placed on oral iron while at the skilled nursing facility and then after discharge chewable iron tablets were ordered for her by Ms. Berta Minor however her hemoglobin has remained very low.  The patient reports feeling dizzy and short of breath.  Her daughter reports she is only able to walk about 15 feet without giving out.  She has to sit and rest.  She states that she has to stop to rest on her way to the bathroom and on her way to the kitchen when trying to just prepare toast.  Daughter states that after simply going to the bathroom and preparing toast the patient is just simply exhausted.  She has been quite fatigued and having a hard time doing her rehabilitation activities.  Her rehab had to get delayed.  She has a great deal of swelling in her legs and reports that she has pain in the leg and has what appears to be chronic venous stasis as well.  Denies any fever, cough, nausea, vomiting, dysuria, urinary frequency except  that caused by her diuretic, headache, blurry vision, unilateral weakness or numbness.  ED Course: Hemoglobin 7.8, INR 2.5, CV low at 74, creatinine 1.27, chest x-ray which shows persistent bilateral pleural effusions and bilateral airspace opacity favored to represent atelectasis but infiltrate not excluded.  Review of Systems: As per HPI otherwise all other systems reviewed and  negative.  Past Medical History:  Diagnosis Date   Adjustment disorder with anxiety    Anginal pain ([place])    Arthritis    "some in my hands" (11/11/2012)   Atrial fibrillation (HCC)    Automatic implantable cardioverter-defibrillator in situ    Blood in stool    Carotid artery stenosis 09/2007   a. 09/2007: 60-79% bilateral (stable); b. 10/2008: 40-59% R 60-79%    Chest pain, unspecified    Chronic airway obstruction, not elsewhere classified    Chronic bronchitis ([place])    "get it some; not q year" (11/11/2012)   Chronic diastolic CHF (congestive heart failure) ([place]) 06/04/2013   Chronic kidney disease, unspecified    Coronary artery disease    non-obstructive by 2006 cath   Frequent UTI    "get them a couple times/yr" (11/11/2012)   Hemorrhage of rectum and anus    High cholesterol    History of blood transfusion 04/2011   "after hip OR" (11/11/2012)   Hypertension 05/20/2011   Hypertr obst cardiomyop    Hypotension, unspecified    cardiac cath 2006..nonobstructive CAD 30-40s lesions.Marland KitchenETT 1/09  nondiagnostic due to poor HR response..Right Renal Cancer 2003   Long term (current) use of anticoagulants    Malignant neoplasm of kidney, except pelvis    Osteoarthritis of right hip    Other and unspecified coagulation defects    PONV (postoperative nausea and vomiting)    Renal cancer ([place]) 06/2001   Right   Secondary cardiomyopathy, unspecified    Sinus bradycardia    Urge incontinence     Past Surgical History:  Procedure Laterality Date   ABDOMINAL HYSTERECTOMY  1975   for  benign causes   APPENDECTOMY     BI-VENTRICULAR PACEMAKER UPGRADE  05/04/2010   CARDIAC CATHETERIZATION  2006   CARDIOVERSION N/A 02/20/2018   Procedure: CARDIOVERSION;  Surgeon: Minna Merritts, MD;  Location: [facility];  Service: Cardiovascular;  Laterality: N/A;   CARDIOVERSION N/A 03/27/2018   Procedure: CARDIOVERSION;  Surgeon: Minna Merritts, MD;  Location: [facility];  Service: Cardiovascular;  Laterality: N/A;   CATARACT EXTRACTION W/ INTRAOCULAR LENS  IMPLANT, BILATERAL  01/2006-02-2006   CHOLECYSTECTOMY N/A 11/11/2012   Procedure: LAPAROSCOPIC CHOLECYSTECTOMY WITH INTRAOPERATIVE CHOLANGIOGRAM;  Surgeon: Imogene Burn. Georgette Dover, MD;  Location: [facility];  Service: General;  Laterality: N/A;   EP IMPLANTABLE DEVICE N/A 02/21/2016   Procedure: ICD Generator Changeout;  Surgeon: Deboraha Sprang, MD;  Location: [facility];  Service: Cardiovascular;  Laterality: N/A;   INSERT / REPLACE / REMOVE PACEMAKER  05-01-11   02-28-05-/05-04-10-ICD-MEDTRONIC MAXIMAL DR   JOINT REPLACEMENT     LAPAROSCOPIC CHOLECYSTECTOMY  11/11/2012   LAPAROSCOPIC LYSIS OF ADHESIONS N/A 11/11/2012   Procedure: LAPAROSCOPIC LYSIS OF ADHESIONS;  Surgeon: Imogene Burn. Georgette Dover, MD;  Location: [facility];  Service: General;  Laterality: N/A;   NEPHRECTOMY Right 06/2001    S/P RENAL CELL CANCER   TOTAL HIP ARTHROPLASTY Right 05/03/2011   Procedure: TOTAL HIP ARTHROPLASTY ANTERIOR APPROACH;  Surgeon: Mcarthur Rossetti;  Location: [facility];  Service: Orthopedics;  Laterality: Right;  Removal of Cannulated Screws Right Hip, Right Direct Anterior Hip Replacement   TOTAL HIP ARTHROPLASTY Left 10/09/2018   Procedure: TOTAL HIP ARTHROPLASTY ANTERIOR APPROACH;  Surgeon: Rod Can, MD;  Location: [facility];  Service: Orthopedics;  Laterality: Left;    Social History   Social History Narrative   [place]ID[place]   CHILDREN AND GRANDCHILDREN ALL LIVE CLOSE BY BUT SHE LIVES ALONE.   VERY ACTIVE IN HER CHURCH, HAS GROUP OF 4 BEST  FRIENDS, SELF TITLED "THE GOLDEN GIRLS".   ALCOHOL USE-NO   RETIRED   FORMER TOBACCO USE.....1/2 PACK X 40 YRS UNTIL 2003   Would desire CPR     reports that she quit smoking about 17 years ago. Her smoking use included cigarettes. She has a 20.00 pack-year smoking history. She has never used smokeless tobacco. She reports that she does not drink alcohol or use drugs.  Allergies  Allergen Reactions   Codeine Nausea And Vomiting   Morphine And Related Nausea And Vomiting   Sulfonamide Derivatives Other (See Comments)    Dry mouth    Family History  Problem Relation Age of Onset   Heart failure Mother    Breast cancer Maternal Aunt 82   Breast cancer Cousin    Breast cancer Other    Colon cancer Neg Hx      Prior to Admission medications   Medication Sig Start Date End Date Taking? Authorizing Provider  acetaminophen (TYLENOL) 325 MG tablet Take 2 tablets (650 mg total) by mouth every 6 (six) hours  as needed for mild pain or moderate pain (or Fever >/= 101). 10/12/18   Aline August, MD  bisacodyl (DULCOLAX) 5 MG EC tablet Take 1 tablet (5 mg total) by mouth daily as needed for moderate constipation. 10/12/18   Aline August, MD  cetirizine (ZYRTEC) 10 MG tablet Take 10 mg by mouth daily.     [provider]  conjugated estrogens (PREMARIN) vaginal cream Place 1 Applicatorful vaginally daily. 07/31/18   Elby Beck, FNP  CRANBERRY PO Take 4 tablets by mouth daily.    [provider]  docusate sodium (COLACE) 100 MG capsule Take 1 capsule (100 mg total) by mouth 2 (two) times daily. 10/12/18   Aline August, MD  ezetimibe (ZETIA) 10 MG tablet Take 1 tablet (10 mg total) by mouth daily. 08/18/17   Minna Merritts, MD  fluticasone (FLONASE) 50 MCG/ACT nasal spray Place 2 sprays into both nostrils daily. 04/22/18   Elby Beck, FNP  hydrocortisone 2.5 % cream Apply 1 application topically daily as needed (itching).  02/04/18   [provider]  levothyroxine (SYNTHROID) 25 MCG tablet Take 1 tablet (25 mcg total) by mouth daily before breakfast. 09/22/18   Deboraha Sprang, MD  magnesium oxide (MAG-OX) 400 MG tablet Take 1 tablet (400 mg total) by mouth daily. 06/05/18   Deboraha Sprang, MD  metolazone (ZAROXOLYN) 5 MG tablet Take 1 tablet (5 mg total) by mouth 2 (two) times a week for 30 days. Take 5 mg twice a week 10/22/18 11/21/18  Minna Merritts, MD  potassium chloride SA (K-DUR) 20 MEQ tablet Take 1 tablet (20 meq) by mouth twice daily, except on metolazone days take 2 tablets (40 meq) twice daily 10/02/18   Deboraha Sprang, MD  rosuvastatin (CRESTOR) 40 MG tablet Take 1 tablet (40 mg total) by mouth daily. 11/11/16   Minna Merritts, MD  torsemide (DEMADEX) 20 MG tablet Take 2 tablets (40 mg) by mouth once daily. 08/25/18   Deboraha Sprang, MD  warfarin (COUMADIN) 2 MG tablet Take 1 tablet (2 mg total) by mouth as directed. Take 2 mg on Monday- Saturday and 1 Mg on Sunday 10/12/18   Aline August, MD    Physical Exam:  Constitutional: Acutely and chronically ill-appearing female with 2-3 word dyspnea and mild increased respiratory rate Vitals:   11/04/18 1630 11/04/18 1645 11/04/18 1715 11/04/18 1745  BP: 114/76 (!) 124/43 (!) 133/56 (!) 141/56  Pulse: 79 83 72 82  Resp: (!) 24 (!) 28 (!) 23 (!) 27  Temp:      TempSrc:      SpO2: 98% 90% 92% 94%  Weight:      Height:       Eyes: PERRL, lids and conjunctivae normal ENMT: Mucous membranes are moist. Posterior pharynx clear of any exudate or lesions.Normal dentition.  Neck: normal, supple, no masses, no thyromegaly Respiratory: Rales halfway up the lung fields with dullness to percussion at the bases no wheezing, no rhonchi.  Increased respiratory effort. No accessory muscle use.  Cardiovascular: Irregular rate and rhythm, no murmurs / rubs / gallops.  Bilateral anasarca to the mid thighs. 2+ pedal pulses. No carotid bruits.  Positive HJ R mild jugular venous  distention Abdomen: no tenderness, no masses palpated. No hepatosplenomegaly. Bowel sounds positive.  Musculoskeletal: no clubbing / cyanosis. No joint deformity upper and lower extremities. Good ROM, no contractures. Normal muscle tone.  Skin: Bilateral PAD rashes and erythema, lesions, ulcers.  Induration chronic  edema of the lower extremities Neurologic: CN 2-12 grossly intact. Sensation intact, DTR normal. Strength 5/5 in all 4.  Psychiatric: Normal judgment and insight. Alert and oriented x 3. Normal mood.    Labs on Admission: I have personally reviewed following labs and imaging studies  CBC: Recent Labs  Lab 11/03/18 1353 11/04/18 1322  WBC 3.9* 5.4  NEUTROABS 2.3 4.3  HGB 7.4 Repeated and verified X2.* 7.8*  HCT 24.0* 28.5*  MCV 68.2* 74.4*  PLT 291.0 191   Basic Metabolic Panel: Recent Labs  Lab 11/03/18 1353 11/04/18 1322  NA 134* 135  K 3.3* 3.7  CL 94* 94*  CO2 33* 30  GLUCOSE 106* 101*  BUN 21 19  CREATININE 1.20 1.27*  CALCIUM 8.5 9.0   GFR: Estimated Creatinine Clearance: 36.8 mL/min (A) (by C-G formula based on SCr of 1.27 mg/dL (H)). Coagulation Profile: Recent Labs  Lab 11/03/18 1406 11/04/18 1322  INR 3.6* 2.5*   Urine analysis:    Component Value Date/Time   COLORURINE YELLOW 10/07/2018 0120   APPEARANCEUR CLEAR 10/07/2018 0120   APPEARANCEUR Cloudy (A) 01/27/2018 1330   LABSPEC 1.008 10/07/2018 0120   PHURINE 6.0 10/07/2018 0120   GLUCOSEU NEGATIVE 10/07/2018 0120   HGBUR NEGATIVE 10/07/2018 0120   HGBUR large 11/16/2009 1202   BILIRUBINUR NEGATIVE 10/07/2018 0120   BILIRUBINUR negative 10/05/2018 1435   BILIRUBINUR Negative 01/27/2018 1330   KETONESUR NEGATIVE 10/07/2018 0120   PROTEINUR NEGATIVE 10/07/2018 0120   UROBILINOGEN 0.2 10/05/2018 1435   UROBILINOGEN 1.0 08/16/2014 1754   NITRITE NEGATIVE 10/07/2018 0120   LEUKOCYTESUR NEGATIVE 10/07/2018 0120    Radiological Exams on Admission: Dg Chest Port 1 View  Result Date:  11/04/2018 CLINICAL DATA:  Shortness of breath EXAM: PORTABLE CHEST 1 VIEW COMPARISON:  October 10, 2018 FINDINGS: There is a stable multi lead left-sided pacemaker. The heart size is stable but enlarged. There are persistent small bilateral pleural effusions with adjacent bibasilar airspace opacities favored to represent atelectasis. Aortic calcifications are noted. There is no pneumothorax. There is a partially visualized sclerotic lesion in the proximal left humerus. There are few calcified granulomas overlying the right lower lobe. IMPRESSION: 1. Persistent bilateral small pleural effusions. 2. Persistent bibasilar airspace opacities favored to represent atelectasis with infiltrate not entirely excluded. Electronically Signed   By: Constance Holster M.D.   On: 11/04/2018 15:32   Vas [place] Lower Extremity Venous (dvt) (only [facility])  Result Date: 11/04/2018  Lower Venous Study Indications: Swelling, and Edema.  Limitations: Body habitus and poor ultrasound/tissue interface. Comparison Study: Prior study done 10/07/18-negative Performing Technologist: Abram Sander RVS  Examination Guidelines: A complete evaluation includes B-mode imaging, spectral Doppler, color Doppler, and power Doppler as needed of all accessible portions of each vessel. Bilateral testing is considered an integral part of a complete examination. Limited examinations for reoccurring indications may be performed as noted.  +---------+---------------+---------+-----------+----------+--------------+  RIGHT     Compressibility Phasicity Spontaneity Properties Summary         +---------+---------------+---------+-----------+----------+--------------+  CFV       Full            Yes       Yes                                    +---------+---------------+---------+-----------+----------+--------------+  SFJ       Full                                                             +---------+---------------+---------+-----------+----------+--------------+  FV  Prox   Full                                                             +---------+---------------+---------+-----------+----------+--------------+  FV Mid    Full                                                             +---------+---------------+---------+-----------+----------+--------------+  FV Distal Full                                                             +---------+---------------+---------+-----------+----------+--------------+  PFV       Full                                                             +---------+---------------+---------+-----------+----------+--------------+  POP       Full            Yes       Yes                                    +---------+---------------+---------+-----------+----------+--------------+  PTV       Full                                                             +---------+---------------+---------+-----------+----------+--------------+  PERO                                                       Not visualized  +---------+---------------+---------+-----------+----------+--------------+   +---------+---------------+---------+-----------+----------+--------------+  LEFT      Compressibility Phasicity Spontaneity Properties Summary         +---------+---------------+---------+-----------+----------+--------------+  CFV       Full            Yes       Yes                                    +---------+---------------+---------+-----------+----------+--------------+  SFJ       Full                                                             +---------+---------------+---------+-----------+----------+--------------+  FV Prox   Full                                                             +---------+---------------+---------+-----------+----------+--------------+  FV Mid                    Yes       Yes                                    +---------+---------------+---------+-----------+----------+--------------+  FV Distal                 Yes       Yes                                     +---------+---------------+---------+-----------+----------+--------------+  PFV       Full                                                             +---------+---------------+---------+-----------+----------+--------------+  POP       Full            Yes       Yes                                    +---------+---------------+---------+-----------+----------+--------------+  PTV                                                        Not visualized  +---------+---------------+---------+-----------+----------+--------------+  PERO                                                       Not visualized  +---------+---------------+---------+-----------+----------+--------------+     Summary: Right: There is no evidence of deep vein thrombosis in the lower extremity. No cystic structure found in the popliteal fossa. Left: There is no evidence of deep vein thrombosis in the lower extremity. However, portions of this examination were limited- see technologist comments above. No cystic structure found in the popliteal fossa.  *See table(s) above for measurements and observations.    Preliminary     EKG: Independently reviewed.  Atrial fibrillation Nonspecific IVCD with LAD Left ventricular hypertrophy Anterior infarct, old When compared to 10/06/2018 baseline wandering is resolved   Assessment/Plan Principal Problem:   Acute on chronic diastolic CHF (congestive heart failure), NYHA class 4 (HCC) Active Problems:   Pulmonary hypertension (HCC)   Shortness of breath   Postoperative anemia due to acute blood loss   Permanent atrial fibrillation   COPD (chronic obstructive pulmonary  disease) ([place])   Chronic bilateral pleural effusions   Hyperlipidemia   ICD (implantable cardioverter-defibrillator) in place   PAD (peripheral artery disease) ([place])   S/p nephrectomy   Long term (current) use of anticoagulants    1.  Acute on chronic diastolic congestive heart failure New York Heart  Association class IV complicated by shortness of breath and postoperative anemia leading to high-output heart failure.  Recent echocardiogram in June of this year showed an ejection fraction of 60 to 65% but with severe pulmonary hypertension.  Patient states that she drinks 4 bottles of water daily (22 ounces?)  He states her metolazone is not working as well as it used to and that she dislikes having to go to the bathroom all the time because of the torsemide.  The patient has developed high-output heart failure due to her anemia but I think her shortness of breath is complicated also by her severe pulmonary hypertension.  I am going to admit her into the hospital change her metolazone to 2.5 mg twice daily and give her Lasix 80 mg IV twice daily.  I have spoken to Dr. Burt Knack who will notify Dr. Karena Addison to evaluate the patient in the a.m.  2.  Pulmonary hypertension: Last admission her pulmonary pressures were noted to be in the 90s.  Previously they have been hovering about in the 70s.  I believe that the plan was once her hip would be repaired for her to have further evaluation once stabilized from an ambulatory standpoint.  I think at this point we need to proceed with evaluation of her pulmonary hypertension and maximizing her treatment.  Do not think that all of her symptoms are related to her hemoglobin which is albeit low but does not explain degree of her symptoms.  Also with Dr. Burt Knack he mention further evaluation for [facility] hypertension to possibly include right heart catheterization.  3.  Post operative anemia due to acute blood loss: At this point patient also has chronic anemia her MCV is low.  It was last in the 80 range in October 2019 but since then has been steadily dropping.  Yesterday it got as low as 68.  I suspect the patient had a chronic anemia on top of a cute anemia.  The microcytosis is suggestive of that.  Check stool Hemoccults to rule out old blood loss.  If positive patient may  benefit from upper and lower endoscopy.  The patient and her family are very worried about a stomach cancer as that was mentioned at previous hospitalization.  Of note the patient was on iron at [facility] and then started on iron by Ms. Gessner after discharge from the skilled nursing facility but her anemia remains persistent.  In addition to transfusion of blood I am giving her a dose of Venofer I will schedule for tomorrow.  4.  Permanent atrial fibrillation: Patient on long-term warfarin currently rate controlled with current medication regimen.  Pacemaker AICD is in place.  Will hold warfarin pending possible procedures.  5.  COPD: Currently stable and compensated.  Continue current medication management.  No evidence of wheezing on examination.  6.  Chronic bilateral pleural effusions: Likely related to heart failure.  Lasix dosing has been increased markedly and hopefully patient will diurese well and can wear those pleural effusions.  Will follow up with a chest x-ray in the next couple of days.  Currently there is no repeat chest x-ray ordered.  7.  Peripheral arterial disease: Noted on  lower extremities.  Continue warfarin.  8.  History of renal cell carcinoma now with single kidney status post nephrectomy in the early 2000's.   DVT prophylaxis: Warfarin now on hold.  Begin enoxaparin when INR indicative Code Status: Full code Family Communication: Spoke with patient's daughter, believe it was [name], by phone.  Spoke with her at length gave her full information on the plan of care and for consultation and admission to the hospital. Disposition Plan: Likely home with home health and physical therapy in 3 to 4 days Consults called: Cardiology, Dr. Burt Knack.  Dr. Karena Addison will see patient in a.m. Admission status: Inpatient Admit - It is my clinical opinion that admission to INPATIENT is reasonable and necessary because of the expectation that this patient will require hospital care that  crosses at least 2 midnights to treat this condition based on the medical complexity of the problems presented.  Given the aforementioned information, the predictability of an adverse outcome is felt to be significant.     Lady Deutscher MD FACP [facility] Pager [phone]  How to contact the [facility] Attending or Consulting provider [place] or covering provider during after hours [place], for this patient?  1. Check the care team in [facility] and look for a) attending/consulting [facility] provider listed and b) the [facility] team listed 2. Log into [email or web address] and use [place]'s universal password to access. If you do not have the password, please contact the hospital operator. 3. Locate the [facility] provider you are looking for under [facility] and page to a number that you can be directly reached. 4. If you still have difficulty reaching the provider, please page the [facility] (Director on Call) for the Hospitalists listed on amion for assistance.  If 7PM-7AM, please contact night-coverage [email or web address] Password [name]  11/04/2018, 6:31 PM

## 2018-11-05 DIAGNOSIS — I4821 Permanent atrial fibrillation: Secondary | ICD-10-CM

## 2018-11-05 LAB — MAGNESIUM: Magnesium: 2.5 mg/dL — ABNORMAL HIGH (ref 1.7–2.4)

## 2018-11-05 LAB — TYPE AND SCREEN
ABO/RH(D): A NEG
Antibody Screen: NEGATIVE
Unit division: 0

## 2018-11-05 LAB — BASIC METABOLIC PANEL
Anion gap: 10 (ref 5–15)
BUN: 19 mg/dL (ref 8–23)
CO2: 31 mmol/L (ref 22–32)
Calcium: 8.7 mg/dL — ABNORMAL LOW (ref 8.9–10.3)
Chloride: 96 mmol/L — ABNORMAL LOW (ref 98–111)
Creatinine, Ser: 1.38 mg/dL — ABNORMAL HIGH (ref 0.44–1.00)
GFR calc Af Amer: 41 mL/min — ABNORMAL LOW (ref >60)
GFR calc non Af Amer: 36 mL/min — ABNORMAL LOW (ref >60)
Glucose, Bld: 99 mg/dL (ref 70–99)
Potassium: 3.7 mmol/L (ref 3.5–5.1)
Sodium: 137 mmol/L (ref 135–145)

## 2018-11-05 LAB — BPAM RBC
Blood Product Expiration Date: 202007182359
ISSUE DATE / TIME: 202007012354
Unit Type and Rh: 600

## 2018-11-05 LAB — PROTIME-INR
INR: 2.4 — ABNORMAL HIGH (ref 0.8–1.2)
Prothrombin Time: 25.5 seconds — ABNORMAL HIGH (ref 11.4–15.2)

## 2018-11-05 LAB — HEMOGLOBIN AND HEMATOCRIT, BLOOD
HCT: 29.3 % — ABNORMAL LOW (ref 36.0–46.0)
Hemoglobin: 8.4 g/dL — ABNORMAL LOW (ref 12.0–15.0)

## 2018-11-05 MED ORDER — METOLAZONE 2.5 MG PO TABS
2.5000 mg | ORAL_TABLET | Freq: Once | ORAL | Status: AC
  Administered 2018-11-05: 17:00:00 2.5 mg via ORAL
  Filled 2018-11-05: qty 1

## 2018-11-05 MED ORDER — METOLAZONE 2.5 MG PO TABS: 2.5000 mg | ORAL_TABLET | Freq: Once | ORAL | Status: DC

## 2018-11-05 MED ORDER — ENSURE ENLIVE PO LIQD
237.0000 mL | Freq: Two times a day (BID) | ORAL | Status: DC
  Administered 2018-11-06 – 2018-11-08 (×2): 237 mL via ORAL

## 2018-11-05 MED ORDER — SPIRONOLACTONE 12.5 MG HALF TABLET
12.5000 mg | ORAL_TABLET | Freq: Every day | ORAL | Status: DC
  Administered 2018-11-05: 12:00:00 12.5 mg via ORAL
  Filled 2018-11-05 (×2): qty 1

## 2018-11-05 MED ORDER — SODIUM CHLORIDE 0.9 % IV SOLN: 510.0000 mg | Freq: Once | INTRAVENOUS | Status: DC

## 2018-11-05 NOTE — Plan of Care (Signed)
  Problem: Clinical Measurements: Goal: Ability to maintain clinical measurements within normal limits will improve Outcome: Progressing   Problem: Activity: Goal: Risk for activity intolerance will decrease Outcome: Progressing   

## 2018-11-05 NOTE — Progress Notes (Signed)
Orthopedic Tech Progress Note Patient Details:  [name] 1934-01-21 [id number]  Ortho Devices Type of Ortho Device: Louretta Parma boot Ortho Device/Splint Location: Bilateral unna boots Ortho Device/Splint Interventions: Application   Post Interventions Patient Tolerated: Well Instructions Provided: Care of device   [name] 11/05/2018, 5:51 PM

## 2018-11-05 NOTE — Plan of Care (Signed)
  Problem: Clinical Measurements: Goal: Ability to maintain clinical measurements within normal limits will improve Outcome: Progressing   Problem: Clinical Measurements: Goal: Will remain free from infection Outcome: Progressing   Problem: Clinical Measurements: Goal: Diagnostic test results will improve Outcome: Progressing   Problem: Clinical Measurements: Goal: Respiratory complications will improve Outcome: Progressing   Problem: Clinical Measurements: Goal: Cardiovascular complication will be avoided Outcome: Progressing   Problem: Activity: Goal: Risk for activity intolerance will decrease Outcome: Progressing   Problem: Elimination: Goal: Will not experience complications related to bowel motility Outcome: Progressing Goal: Will not experience complications related to urinary retention Outcome: Progressing   Problem: Pain Managment: Goal: General experience of comfort will improve Outcome: Progressing   Problem: Safety: Goal: Ability to remain free from injury will improve Outcome: Progressing   Problem: Skin Integrity: Goal: Risk for impaired skin integrity will decrease Outcome: Progressing

## 2018-11-05 NOTE — Progress Notes (Signed)
[facility] Progress Note  Patient: [name] QJF:[id number]   PCP: Elby Beck, FNP DOB: 1933/06/28   DOA: 11/04/2018   DOS: 11/05/2018   Date of Service: the patient was seen and examined on 11/05/2018  Brief hospital course: Pt. with PMH of chronic combined CHF, AICD in situ, A. fib, CKD 3, CAD, TIA; admitted on 11/04/2018, presented with complaint of fatigue and tiredness as well as the leg swelling, was found to have acute on chronic decompensated heart failure secondary to symptomatic anemia. Currently further plan is continue supporting Hb and further work-up as well as treatment for heart failure.  Subjective: Reports severe shortness of breath.  No nausea no vomiting.  No fever no chills.  No chest pain.  Also reports generalized swelling in legs.  No pain.  Reports fatigue and tiredness.  Assessment and Plan: 1.  Acute on chronic diastolic congestive heart failure New [place] Heart Association class IV complicated by postoperative anemia leading to high-output heart failure. Potentially group 2 pulmonary hypertension with a component of Group 1 or group 3  Echo 6/20 EF 60 to 65% but with severe pulmonary hypertension. Patient states that she drinks 4 bottles of water daily (22 ounces?) She dislikes having to go to the bathroom all the time because of the torsemide Cardiology consulted. Appreciate assistance. Currently on IV Lasix 80 mg twice daily as well as metolazone 2.5 mg daily. Also on Aldactone 12.5 mg daily. Right heart cath scheduled probably on Monday after adequate diuresis.  2.  Multifactorial anemia- Post operative blood loss anemia, anemia chronic kidney disease as well as potential slow GI loss. Also iron deficiency. Iron level 16. Baseline hemoglobin 9-10 On presentation hemoglobin 7.4. After PRBC currently 8.4. MCV 68 and RDW 25+. Check FOBT, if positive will require GI input.  As the patient is on anticoagulation. Currently holding  anticoagulation.  3.  Permanent atrial fibrillation: Patient on long-term warfarin currently rate controlled with current medication regimen.   Pacemaker AICD is in place.   Will hold warfarin  4.  COPD:  Currently stable and compensated.  Continue current medication management.  No evidence of wheezing on examination.  5.  Chronic bilateral pleural effusions:  Likely related to heart failure.  Lasix dosing has been increased markedly and hopefully patient will diurese well and can wear those pleural effusions.  Will follow up with a chest x-ray in the next couple of days.  Currently there is no repeat chest x-ray ordered.  6.  Peripheral arterial disease:  Noted on lower extremities. Check ABI  8.  History of renal cell carcinoma now with single kidney status post nephrectomy in the early 2000's.  Diet: Cardiac diet DVT Prophylaxis: Subcutaneous Lovenox  Advance goals of care discussion: full code  Family Communication: no family was present at bedside, at the time of interview.  Discussed with the family on 11/05/2018  Disposition:  Discharge to Home .  Consultants: cardiology  Procedures: none  Scheduled Meds: . sodium chloride   Intravenous Once  . furosemide  80 mg Intravenous BID  . potassium chloride  20 mEq Oral BID  . sodium chloride flush  3 mL Intravenous Q12H  . spironolactone  12.5 mg Oral Daily   Continuous Infusions: . sodium chloride     PRN Meds: sodium chloride, acetaminophen, ALPRAZolam, ondansetron (ZOFRAN) IV, sodium chloride flush, zolpidem Antibiotics: Anti-infectives (From admission, onward)   None       Objective: Physical Exam: Vitals:   11/05/18 0438 11/05/18  1108 11/05/18 1302 11/05/18 1640  BP: (!) 131/53 (!) 129/54 (!) 117/54 (!) 141/59  Pulse: (!) 54 66 83 63  Resp: 19 18  18   Temp: 98.5 F (36.9 C) (!) 97.5 F (36.4 C)  97.8 F (36.6 C)  TempSrc: Oral Oral  Oral  SpO2: 95% 99% 96% 91%  Weight: 78.5 kg     Height:         Intake/Output Summary (Last 24 hours) at 11/05/2018 1809 Last data filed at 11/05/2018 1700 Gross per 24 hour  Intake 595 ml  Output 2050 ml  Net -1455 ml   Filed Weights   11/04/18 1314 11/04/18 1832 11/05/18 0438  Weight: 79.8 kg 78.4 kg 78.5 kg   General: alert and oriented to time, place, and person. Appear in moderate distress, affect appropriate Eyes: PERRL, Conjunctiva normal ENT: Oral Mucosa Clear, moist  Neck: difficult to assess  JVD, no Abnormal Mass Or lumps Cardiovascular: S1 and S2 Present, aortic systolic  Murmur, peripheral pulses symmetrical Respiratory: normal respiratory effort, Bilateral Air entry equal and Decreased, no use of accessory muscle, bilateral basal Crackles, no wheezes Abdomen: Bowel Sound present, Soft and no tenderness, no hernia Skin: no rashes  Extremities: bilateral  Pedal edema, no calf tenderness Neurologic: normal without focal findings, mental status, speech normal, alert and oriented x3, PERLA, Motor strength 5/5 and symmetric and sensation grossly normal to light touch Gait not checked due to patient safety concerns  Data Reviewed: CBC: Recent Labs  Lab 11/03/18 1353 11/04/18 1322 11/05/18 0406  WBC 3.9* 5.4  --   NEUTROABS 2.3 4.3  --   HGB 7.4 Repeated and verified X2.* 7.8* 8.4*  HCT 24.0* 28.5* 29.3*  MCV 68.2* 74.4*  --   PLT 291.0 298  --    Basic Metabolic Panel: Recent Labs  Lab 11/03/18 1353 11/04/18 1322 11/04/18 1840 11/05/18 0406  NA 134* 135  --  137  K 3.3* 3.7  --  3.7  CL 94* 94*  --  96*  CO2 33* 30  --  31  GLUCOSE 106* 101*  --  99  BUN 21 19  --  19  CREATININE 1.20 1.27*  --  1.38*  CALCIUM 8.5 9.0  --  8.7*  MG  --   --  2.2 2.5*    Liver Function Tests: Recent Labs  Lab 11/04/18 1715  AST 20  ALT 10  ALKPHOS 90  BILITOT 1.3*  PROT 5.9*  ALBUMIN 3.0*   No results for input(s): LIPASE, AMYLASE in the last 168 hours. No results for input(s): AMMONIA in the last 168 hours. Coagulation  Profile: Recent Labs  Lab 11/03/18 1406 11/04/18 1322 11/05/18 0406  INR 3.6* 2.5* 2.4*   Cardiac Enzymes: No results for input(s): CKTOTAL, CKMB, CKMBINDEX, TROPONINI in the last 168 hours. BNP (last 3 results) No results for input(s): PROBNP in the last 8760 hours. CBG: No results for input(s): GLUCAP in the last 168 hours. Studies: No results found.   Time spent: 35 minutes  Author: Berle Mull, MD [facility] 11/05/2018 6:09 PM  To reach On-call, see care teams to locate the attending and reach out to them via [email or web address]. If 7PM-7AM, please contact night-coverage If you still have difficulty reaching the attending provider, please page the [facility] (Director on Call) for [facility] on amion for assistance.

## 2018-11-05 NOTE — Consult Note (Addendum)
Advanced Heart Failure Team Consult Note   Primary Physician: Elby Beck, FNP PCP-Cardiologist:  Virl Axe, MD and Esmond Plants, MD  Reason for Consultation: Heart Failure   HPI:    [name] is seen today for evaluation of heart failure at the request of Dr Harl Bowie.   [name] is an 85 year old with a history of symptomatic bradycardia, nonobstructive CAD by 2006 cath, chronic diastolic HF, carotid stenosis, persistent afib on coumadin, AICD placed in 2006 for primary prevention, renal cell CA s/p right kidney resection now with single kidney, CKD IV,  COPD, and recent history of hip fracture with ORIF June 2020 .   Admitted to [facility] on 6/4 with left hip fracture and underwent ORIF. Hospital course complicated by acute respiratory failure and a/c diastolic heart failure. Diuresed IV lasix and later transitioned to torsemide 40 mg daily. Discharge 10/12/2018  Since discharge she has had ongoing problems with anemia and shortness of breath. On 6/17 she saw Dr Rockey Situ and was instructed to increased to torsemide to 40 mg daily and increase metolazone to twice weekly. She has had ongoing leg edema and has been sleeping in a recliner.   Yesterday she presented to [facility] with increased to shortness of breath. CXR showed small pleural effusions. Pertinent admission labs include: SARs Covid Negative, INR 2.5, Hgb 7.8, Creatinine 1.27, BNP 369. Given 1URPBCs. Cardiology consulted and started IV lasix and received 2.5 mg metolazone. .   Today she is complaining shortness with breath with movement.    Echo 10/06/2018 1. The left ventricle has normal systolic function with an ejection fraction of 60-65%. The cavity size was normal. Left ventricular diastolic function could not be evaluated due to nondiagnostic images.  2. The right ventricle has normal systolic function. The cavity was mildly enlarged. There is moderately increased right ventricular wall thickness. Right ventricular  systolic pressure is severely elevated with an estimated pressure of 93.2 mmHg.  3. Left atrial size was mildly dilated.  4. The mitral valve is degenerative. Mild thickening of the mitral valve leaflet.  5. The tricuspid valve is not well visualized. Tricuspid valve regurgitation is mild-moderate.  6. The aortic valve was not well visualized. Mild thickening of the aortic valve. Aortic valve regurgitation is trivial by color flow Doppler.  7. The inferior vena cava was dilated in size with <50% respiratory variability. Review of Systems: [y] = yes, [ ]  = no    General: Weight gain [Y ]; Weight loss [ ] ; Anorexia [ ] ; Fatigue [ Y]; Fever [ ] ; Chills [ ] ; Weakness [Y ]   Cardiac: Chest pain/pressure [ ] ; Resting SOB [ ] ; Exertional SOB [Y ]; Orthopnea [ ] ; Pedal Edema [Y ]; Palpitations [ ] ; Syncope [ ] ; Presyncope [ ] ; Paroxysmal nocturnal dyspnea[ ]    Pulmonary: Cough [ ] ; Wheezing[ ] ; Hemoptysis[ ] ; Sputum [ ] ; Snoring [ ]    GI: Vomiting[ ] ; Dysphagia[ ] ; Melena[ ] ; Hematochezia [ ] ; Heartburn[ ] ; Abdominal pain [ ] ; Constipation [ ] ; Diarrhea [ ] ; BRBPR [ ]    GU: Hematuria[ ] ; Dysuria [ ] ; Nocturia[ ]    Vascular: Pain in legs with walking [ ] ; Pain in feet with lying flat [ ] ; Non-healing sores [ ] ; Stroke [ ] ; TIA [ ] ; Slurred speech [ ] ;   Neuro: Headaches[ ] ; Vertigo[ ] ; Seizures[ ] ; Paresthesias[ ] ;Blurred vision [ ] ; Diplopia [ ] ; Vision changes [ ]    Ortho/Skin: Arthritis [ ] ; Joint pain [Y ]; Muscle pain [ ] ;  Joint swelling [ ] ; Back Pain [Y ]; Rash [ ]    Psych: Depression[Y ]; Anxiety[ ]    Heme: Bleeding problems [ ] ; Clotting disorders [ ] ; Anemia [ ]    Endocrine: Diabetes [ ] ; Thyroid dysfunction[ ]   Home Medications Prior to Admission medications   Medication Sig Start Date End Date Taking? Authorizing Provider  acetaminophen (TYLENOL) 325 MG tablet Take 2 tablets (650 mg total) by mouth every 6 (six) hours as needed for mild pain or moderate pain (or Fever >/= 101).  10/12/18   Aline August, MD  bisacodyl (DULCOLAX) 5 MG EC tablet Take 1 tablet (5 mg total) by mouth daily as needed for moderate constipation. 10/12/18   Aline August, MD  cetirizine (ZYRTEC) 10 MG tablet Take 10 mg by mouth daily.     [provider]  conjugated estrogens (PREMARIN) vaginal cream Place 1 Applicatorful vaginally daily. 07/31/18   Elby Beck, FNP  CRANBERRY PO Take 4 tablets by mouth daily.    [provider]  docusate sodium (COLACE) 100 MG capsule Take 1 capsule (100 mg total) by mouth 2 (two) times daily. 10/12/18   Aline August, MD  ezetimibe (ZETIA) 10 MG tablet Take 1 tablet (10 mg total) by mouth daily. 08/18/17   Minna Merritts, MD  fluticasone (FLONASE) 50 MCG/ACT nasal spray Place 2 sprays into both nostrils daily. 04/22/18   Elby Beck, FNP  hydrocortisone 2.5 % cream Apply 1 application topically daily as needed (itching).  02/04/18   [provider]  levothyroxine (SYNTHROID) 25 MCG tablet Take 1 tablet (25 mcg total) by mouth daily before breakfast. 09/22/18   Deboraha Sprang, MD  magnesium oxide (MAG-OX) 400 MG tablet Take 1 tablet (400 mg total) by mouth daily. 06/05/18   Deboraha Sprang, MD  metolazone (ZAROXOLYN) 5 MG tablet Take 1 tablet (5 mg total) by mouth 2 (two) times a week for 30 days. Take 5 mg twice a week 10/22/18 11/21/18  Minna Merritts, MD  potassium chloride SA (K-DUR) 20 MEQ tablet Take 1 tablet (20 meq) by mouth twice daily, except on metolazone days take 2 tablets (40 meq) twice daily 10/02/18   Deboraha Sprang, MD  rosuvastatin (CRESTOR) 40 MG tablet Take 1 tablet (40 mg total) by mouth daily. 11/11/16   Minna Merritts, MD  torsemide (DEMADEX) 20 MG tablet Take 2 tablets (40 mg) by mouth once daily. 08/25/18   Deboraha Sprang, MD  warfarin (COUMADIN) 2 MG tablet Take 1 tablet (2 mg total) by mouth as directed. Take 2 mg on Monday- Saturday and 1 Mg on Sunday 10/12/18   Aline August, MD    Past Medical  History: Past Medical History:  Diagnosis Date   Adjustment disorder with anxiety    Anginal pain ([place])    Arthritis    "some in my hands" (11/11/2012)   Atrial fibrillation (HCC)    Automatic implantable cardioverter-defibrillator in situ    Blood in stool    Carotid artery stenosis 09/2007   a. 09/2007: 60-79% bilateral (stable); b. 10/2008: 40-59% R 60-79%    Chest pain, unspecified    Chronic airway obstruction, not elsewhere classified    Chronic bronchitis ([place])    "get it some; not q year" (11/11/2012)   Chronic diastolic CHF (congestive heart failure) ([place]) 06/04/2013   Chronic kidney disease, unspecified    Coronary artery disease    non-obstructive by 2006 cath   Frequent UTI    "get  them a couple times/yr" (11/11/2012)   Hemorrhage of rectum and anus    High cholesterol    History of blood transfusion 04/2011   "after hip OR" (11/11/2012)   Hypertension 05/20/2011   Hypertr obst cardiomyop    Hypotension, unspecified    cardiac cath 2006..nonobstructive CAD 30-40s lesions.Marland KitchenETT 1/09 nondiagnostic due to poor HR response..Right Renal Cancer 2003   Long term (current) use of anticoagulants    Malignant neoplasm of kidney, except pelvis    Osteoarthritis of right hip    Other and unspecified coagulation defects    PONV (postoperative nausea and vomiting)    Renal cancer ([place]) 06/2001   Right   Secondary cardiomyopathy, unspecified    Sinus bradycardia    Urge incontinence     Past Surgical History: Past Surgical History:  Procedure Laterality Date   ABDOMINAL HYSTERECTOMY  1975   for benign causes   APPENDECTOMY     BI-VENTRICULAR PACEMAKER UPGRADE  05/04/2010   CARDIAC CATHETERIZATION  2006   CARDIOVERSION N/A 02/20/2018   Procedure: CARDIOVERSION;  Surgeon: Minna Merritts, MD;  Location: [facility];  Service: Cardiovascular;  Laterality: N/A;   CARDIOVERSION N/A 03/27/2018   Procedure: CARDIOVERSION;  Surgeon: Minna Merritts, MD;   Location: [facility];  Service: Cardiovascular;  Laterality: N/A;   CATARACT EXTRACTION W/ INTRAOCULAR LENS  IMPLANT, BILATERAL  01/2006-02-2006   CHOLECYSTECTOMY N/A 11/11/2012   Procedure: LAPAROSCOPIC CHOLECYSTECTOMY WITH INTRAOPERATIVE CHOLANGIOGRAM;  Surgeon: Imogene Burn. Georgette Dover, MD;  Location: [place];  Service: General;  Laterality: N/A;   EP IMPLANTABLE DEVICE N/A 02/21/2016   Procedure: ICD Generator Changeout;  Surgeon: Deboraha Sprang, MD;  Location: [place] [facility];  Service: Cardiovascular;  Laterality: N/A;   INSERT / REPLACE / REMOVE PACEMAKER  05-01-11   02-28-05-/05-04-10-ICD-MEDTRONIC MAXIMAL DR   JOINT REPLACEMENT     LAPAROSCOPIC CHOLECYSTECTOMY  11/11/2012   LAPAROSCOPIC LYSIS OF ADHESIONS N/A 11/11/2012   Procedure: LAPAROSCOPIC LYSIS OF ADHESIONS;  Surgeon: Imogene Burn. Georgette Dover, MD;  Location: [place];  Service: General;  Laterality: N/A;   NEPHRECTOMY Right 06/2001    S/P RENAL CELL CANCER   TOTAL HIP ARTHROPLASTY Right 05/03/2011   Procedure: TOTAL HIP ARTHROPLASTY ANTERIOR APPROACH;  Surgeon: Mcarthur Rossetti;  Location: [facility];  Service: Orthopedics;  Laterality: Right;  Removal of Cannulated Screws Right Hip, Right Direct Anterior Hip Replacement   TOTAL HIP ARTHROPLASTY Left 10/09/2018   Procedure: TOTAL HIP ARTHROPLASTY ANTERIOR APPROACH;  Surgeon: Rod Can, MD;  Location: [place];  Service: Orthopedics;  Laterality: Left;    Family History: Family History  Problem Relation Age of Onset   Heart failure Mother    Breast cancer Maternal Aunt 82   Breast cancer Cousin    Breast cancer Other    Colon cancer Neg Hx     Social History: Social History   Socioeconomic History   Marital status: Widowed    Spouse name: Not on file   Number of children: Not on file   Years of education: Not on file   Highest education level: Not on file  Occupational History   Occupation: Retired    Fish farm manager: RETIRED  Scientist, product/process development strain:  Not on file   Food insecurity    Worry: Not on file    Inability: Not on file   Transportation needs    Medical: Not on file    Non-medical: Not on file  Tobacco Use   Smoking status: Former Smoker  Packs/day: 0.50    Years: 40.00    Pack years: 20.00    Types: Cigarettes    Quit date: 05/06/2001    Years since quitting: 17.5   Smokeless tobacco: Never Used  Substance and Sexual Activity   Alcohol use: No    Alcohol/week: 0.0 standard drinks   Drug use: No   Sexual activity: Never  Lifestyle   Physical activity    Days per week: Not on file    Minutes per session: Not on file   Stress: Not on file  Relationships   Social connections    Talks on phone: Not on file    Gets together: Not on file    Attends religious service: Not on file    Active member of club or organization: Not on file    Attends meetings of clubs or organizations: Not on file    Relationship status: Not on file  Other Topics Concern   Not on file  Social History Narrative   WIDOW   CHILDREN AND GRANDCHILDREN ALL LIVE CLOSE BY BUT [place], HAS GROUP OF 4 BEST FRIENDS, SELF TITLED "[place]".   ALCOHOL USE-NO   RETIRED   FORMER TOBACCO USE.....1/2 PACK X 40 YRS UNTIL 2003   Would desire CPR    Allergies:  Allergies  Allergen Reactions   Codeine Nausea And Vomiting   Morphine And Related Nausea And Vomiting   Sulfonamide Derivatives Other (See Comments)    Dry mouth    Objective:    Vital Signs:   Temp:  [97.3 F (36.3 C)-98.5 F (36.9 C)] 98.5 F (36.9 C) (07/02 0438) Pulse Rate:  [54-96] 54 (07/02 0438) Resp:  [16-29] 19 (07/02 0438) BP: (107-161)/(35-78) 131/53 (07/02 0438) SpO2:  [90 %-100 %] 95 % (07/02 0438) Weight:  [78.4 kg-79.8 kg] 78.5 kg (07/02 0438) Last BM Date: 11/04/18  Weight change: Filed Weights   11/04/18 1314 11/04/18 1832 11/05/18 0438  Weight: 79.8 kg 78.4 kg 78.5 kg     Intake/Output:   Intake/Output Summary (Last 24 hours) at 11/05/2018 0803 Last data filed at 11/05/2018 0448 Gross per 24 hour  Intake 315 ml  Output 550 ml  Net -235 ml      Physical Exam    General:  Dyspneic moving in the bed.  HEENT: normal Neck: supple. JVP to jaw  . Carotids 2+ bilat; no bruits. No lymphadenopathy or thyromegaly appreciated. Cor: PMI nondisplaced. Irregular rate & rhythm. No rubs, gallops or murmurs. Lungs: Crackles in the bases on 32 liters oxyge.  Abdomen: soft, nontender, nondistended. No hepatosplenomegaly. No bruits or masses. Good bowel sounds. Extremities: no cyanosis, clubbing, rash, R and LLE 3+ edema Neuro: alert & orientedx3, cranial nerves grossly intact. moves all 4 extremities w/o difficulty. Affect pleasant   Telemetry   A fib 90s   EKG    A fib 88 bpm   Labs   Basic Metabolic Panel: Recent Labs  Lab 11/03/18 1353 11/04/18 1322 11/04/18 1840 11/05/18 0406  NA 134* 135  --  137  K 3.3* 3.7  --  3.7  CL 94* 94*  --  96*  CO2 33* 30  --  31  GLUCOSE 106* 101*  --  99  BUN 21 19  --  19  CREATININE 1.20 1.27*  --  1.38*  CALCIUM 8.5 9.0  --  8.7*  MG  --   --  2.2 2.5*  Liver Function Tests: Recent Labs  Lab 11/04/18 1715  AST 20  ALT 10  ALKPHOS 90  BILITOT 1.3*  PROT 5.9*  ALBUMIN 3.0*   No results for input(s): LIPASE, AMYLASE in the last 168 hours. No results for input(s): AMMONIA in the last 168 hours.  CBC: Recent Labs  Lab 11/03/18 1353 11/04/18 1322 11/05/18 0406  WBC 3.9* 5.4  --   NEUTROABS 2.3 4.3  --   HGB 7.4 Repeated and verified X2.* 7.8* 8.4*  HCT 24.0* 28.5* 29.3*  MCV 68.2* 74.4*  --   PLT 291.0 298  --     Cardiac Enzymes: No results for input(s): CKTOTAL, CKMB, CKMBINDEX, TROPONINI in the last 168 hours.  BNP: BNP (last 3 results) Recent Labs    10/06/18 2220 11/04/18 1715  BNP 235.7* 369.3*    ProBNP (last 3 results) No results for input(s): PROBNP in the last 8760  hours.   CBG: No results for input(s): GLUCAP in the last 168 hours.  Coagulation Studies: Recent Labs    11/03/18 1406 11/04/18 1322 11/05/18 0406  LABPROT  --  26.8* 25.5*  INR 3.6* 2.5* 2.4*     Imaging   Dg Chest Port 1 View  Result Date: 11/04/2018 CLINICAL DATA:  Shortness of breath EXAM: PORTABLE CHEST 1 VIEW COMPARISON:  October 10, 2018 FINDINGS: There is a stable multi lead left-sided pacemaker. The heart size is stable but enlarged. There are persistent small bilateral pleural effusions with adjacent bibasilar airspace opacities favored to represent atelectasis. Aortic calcifications are noted. There is no pneumothorax. There is a partially visualized sclerotic lesion in the proximal left humerus. There are few calcified granulomas overlying the right lower lobe. IMPRESSION: 1. Persistent bilateral small pleural effusions. 2. Persistent bibasilar airspace opacities favored to represent atelectasis with infiltrate not entirely excluded. Electronically Signed   By: Constance Holster M.D.   On: 11/04/2018 15:32   Vas [place] Lower Extremity Venous (dvt) (only [facility])  Result Date: 11/04/2018  Lower Venous Study Indications: Swelling, and Edema.  Limitations: Body habitus and poor ultrasound/tissue interface. Comparison Study: Prior study done 10/07/18-negative Performing Technologist: Abram Sander RVS  Examination Guidelines: A complete evaluation includes B-mode imaging, spectral Doppler, color Doppler, and power Doppler as needed of all accessible portions of each vessel. Bilateral testing is considered an integral part of a complete examination. Limited examinations for reoccurring indications may be performed as noted.  +---------+---------------+---------+-----------+----------+--------------+  RIGHT     Compressibility Phasicity Spontaneity Properties Summary         +---------+---------------+---------+-----------+----------+--------------+  CFV       Full            Yes       Yes                                     +---------+---------------+---------+-----------+----------+--------------+  SFJ       Full                                                             +---------+---------------+---------+-----------+----------+--------------+  FV Prox   Full                                                             +---------+---------------+---------+-----------+----------+--------------+  FV Mid    Full                                                             +---------+---------------+---------+-----------+----------+--------------+  FV Distal Full                                                             +---------+---------------+---------+-----------+----------+--------------+  PFV       Full                                                             +---------+---------------+---------+-----------+----------+--------------+  POP       Full            Yes       Yes                                    +---------+---------------+---------+-----------+----------+--------------+  PTV       Full                                                             +---------+---------------+---------+-----------+----------+--------------+  PERO                                                       Not visualized  +---------+---------------+---------+-----------+----------+--------------+   +---------+---------------+---------+-----------+----------+--------------+  LEFT      Compressibility Phasicity Spontaneity Properties Summary         +---------+---------------+---------+-----------+----------+--------------+  CFV       Full            Yes       Yes                                    +---------+---------------+---------+-----------+----------+--------------+  SFJ       Full                                                             +---------+---------------+---------+-----------+----------+--------------+  FV Prox   Full                                                              +---------+---------------+---------+-----------+----------+--------------+  FV Mid                    Yes       Yes                                    +---------+---------------+---------+-----------+----------+--------------+  FV Distal                 Yes       Yes                                    +---------+---------------+---------+-----------+----------+--------------+  PFV       Full                                                             +---------+---------------+---------+-----------+----------+--------------+  POP       Full            Yes       Yes                                    +---------+---------------+---------+-----------+----------+--------------+  PTV                                                        Not visualized  +---------+---------------+---------+-----------+----------+--------------+  PERO                                                       Not visualized  +---------+---------------+---------+-----------+----------+--------------+     Summary: Right: There is no evidence of deep vein thrombosis in the lower extremity. No cystic structure found in the popliteal fossa. Left: There is no evidence of deep vein thrombosis in the lower extremity. However, portions of this examination were limited- see technologist comments above. No cystic structure found in the popliteal fossa.  *See table(s) above for measurements and observations.    Preliminary       Medications:     Current Medications:  sodium chloride   Intravenous Once   furosemide  80 mg Intravenous BID   potassium chloride  20 mEq Oral BID   sodium chloride flush  3 mL Intravenous Q12H     Infusions:  sodium chloride     ferric gluconate (FERRLECIT/NULECIT) IV         Assessment/Plan   1. A/C Diastolic HF Medtronic ICD 2006. ECHO 10/06/2018 EF  Marked volume overload. Continue 80 mg IV lasix twice a day.  Follow renal function closely.   2. Anemia Hgb 7.8 on admit. Received 1UPRBC.  Hgb today 8.4.  Iron sats low.   3. Pulmonary HTN 10/06/2018 Elevated PA pressures on ECHO.  10/07/2018 CTA moderate pleural effusion. Negative for PE HIV, ANA, RF pending. Known COPD.  Not  ready for RHC yet with marked volume overload.  Down the road she will need sleep study.   4. Chronic A fib Rate Controlled. Coumadin held with anemia  Failed cardioversion 02/2018, 03/2018, tikosyn, flecainide, amio.   5. CKD Stage III.  Baseline creatinine 1.2-1.6 H/O Nephrectomy for renal cell ca.  - Creatinine today 1.4.     Length of Stay: 1  Amy Clegg, NP  11/05/2018, 8:03 AM  Advanced Heart Failure Team Pager [phone] (M-F; 7a - 4p)  Please contact [facility] for night-coverage after hours (4p -7a ) and weekends on [email or web address]  Patient seen with NP, agree with the above note.   Patient has history of permanent atrial fibrillation, chronic diastolic CHF, CKD stage 3, smoking/prior COPD, renal cell CA s/p right nephrectomy.  She had a recent left hip ORIF after mechanical fall.  She has had dyspnea and edema for "a long time," but it has been worse since January of this year.  After she got home from the hospital stay post-ORIF, she noted marked peripheral edema.  She was short of breath walking short distances.  +orthopnea.  She came back to the hospital on 7/1 and was admitted with volume overload.   Echo in 6/20 showed EF 60-65%, mild RV dilation with normal systolic function, PASP 93 mmHg.   General: NAD Neck: JVP 14-16 cm, no thyromegaly or thyroid nodule.  Lungs: Clear to auscultation bilaterally with normal respiratory effort. CV: Nondisplaced PMI.  Heart irregular S1/S2, no S3/S4, no murmur.  2+ edema to thighs.  No carotid bruit.  Unable to palpate pedal pulses due to edema.  Abdomen: Soft, nontender, no hepatosplenomegaly, no distention.  Skin: Intact without lesions or rashes.  Neurologic: Alert and oriented x 3.  Psych: Normal affect. Extremities: No clubbing or cyanosis.    HEENT: Normal.   Assessment/Plan:  1. Acute on chronic diastolic CHF: Echo in 2/45 showed EF 60-65%, mild RV dilation with normal systolic function, PASP 93 mmHg.  She had remote nonischemic cardiomyopathy with recovery of EF, has Medtronic ICD.  She is markedly volume overloaded on exam with impressive peripheral edema.  Elevated PA pressure noted (not sure if primary or secondary), and suspect that there is significant RV dysfunction. Atrial fibrillation likely contributes, but this appears to be permanent now as she has failed multiple anti-arrhythmics and is reasonably rate-controlled.  - Lasix 80 mg IV bid today with a dose of metolazone 2.5.  - Add spironolactone 12.5 daily.  2. Pulmonary hypertension: PA systolic pressure estimated 93 mmHg on echo. This may be group 2 PH due to elevated left atrial pressure from LV diastolic dysfunction.  However, it is possible that she has a component of group 1 (primary pulmonary hypertension) or group 3 (due to intrinsic lung disease, ?COPD) pulmonary hypertension. CTA chest from 10/07/18 showed no PE and probable pulmonary edema, no definite interstitial lung disease.  - After diuresis, will do RHC (probably Monday).  3. CKD: Stage 3.  Creatinine 1.38 today.  Follow creatinine closely with diuresis.  4. Anemia: Fe deficiency.  Hgb 7.8 at admit, 8.4 today after 1 unit PRBCs.  She denies over bleeding.  She is Fe deficient.  - Fe being replaced IV.   - May need GI involvement (per primary service).  5. Atrial fibrillation: Permanent now.  Rate control is reasonable.  She has failed amiodarone, Tikosyn, and flecainide as well as multiple cardioversions.  - Warfarin is on hold with anemia.   Loralie Champagne 11/05/2018 11:25 AM

## 2018-11-05 NOTE — Progress Notes (Signed)
Cont. Pulse ox placed, patient does desat to 80s with minimal exertion-talking, walking to [facility].

## 2018-11-05 NOTE — Plan of Care (Signed)
  Problem: Clinical Measurements: Goal: Diagnostic test results will improve Outcome: Progressing   Problem: Clinical Measurements: Goal: Cardiovascular complication will be avoided Outcome: Progressing   Problem: Activity: Goal: Risk for activity intolerance will decrease Outcome: Progressing   Problem: Elimination: Goal: Will not experience complications related to bowel motility Outcome: Progressing Goal: Will not experience complications related to urinary retention Outcome: Progressing   Problem: Pain Managment: Goal: General experience of comfort will improve Outcome: Progressing   Problem: Safety: Goal: Ability to remain free from injury will improve Outcome: Progressing   Problem: Skin Integrity: Goal: Risk for impaired skin integrity will decrease Outcome: Progressing

## 2018-11-06 LAB — BLOOD GAS, ARTERIAL
Acid-Base Excess: 9.3 mmol/L — ABNORMAL HIGH (ref 0.0–2.0)
Bicarbonate: 33.6 mmol/L — ABNORMAL HIGH (ref 20.0–28.0)
Drawn by: 350431
O2 Content: 3 L/min
O2 Saturation: 95 %
Patient temperature: 97.5
pCO2 arterial: 46.5 mmHg (ref 32.0–48.0)
pH, Arterial: 7.469 — ABNORMAL HIGH (ref 7.350–7.450)
pO2, Arterial: 68.3 mmHg — ABNORMAL LOW (ref 83.0–108.0)

## 2018-11-06 LAB — BASIC METABOLIC PANEL
Anion gap: 12 (ref 5–15)
Anion gap: 13 (ref 5–15)
Anion gap: 10 (ref 5–15)
BUN: 19 mg/dL (ref 8–23)
BUN: 17 mg/dL (ref 8–23)
BUN: 18 mg/dL (ref 8–23)
CO2: 29 mmol/L (ref 22–32)
CO2: 29 mmol/L (ref 22–32)
CO2: 34 mmol/L — ABNORMAL HIGH (ref 22–32)
Calcium: 8.6 mg/dL — ABNORMAL LOW (ref 8.9–10.3)
Calcium: 9 mg/dL (ref 8.9–10.3)
Calcium: 9.2 mg/dL (ref 8.9–10.3)
Chloride: 93 mmol/L — ABNORMAL LOW (ref 98–111)
Chloride: 94 mmol/L — ABNORMAL LOW (ref 98–111)
Chloride: 92 mmol/L — ABNORMAL LOW (ref 98–111)
Creatinine, Ser: 1.23 mg/dL — ABNORMAL HIGH (ref 0.44–1.00)
Creatinine, Ser: 1.2 mg/dL — ABNORMAL HIGH (ref 0.44–1.00)
Creatinine, Ser: 1.29 mg/dL — ABNORMAL HIGH (ref 0.44–1.00)
GFR calc Af Amer: 49 mL/min — ABNORMAL LOW (ref >60)
GFR calc Af Amer: 45 mL/min — ABNORMAL LOW (ref >60)
GFR calc non Af Amer: 42 mL/min — ABNORMAL LOW (ref >60)
GFR calc non Af Amer: 39 mL/min — ABNORMAL LOW (ref >60)
Glucose, Bld: 104 mg/dL — ABNORMAL HIGH (ref 70–99)
Glucose, Bld: 114 mg/dL — ABNORMAL HIGH (ref 70–99)
Glucose, Bld: 139 mg/dL — ABNORMAL HIGH (ref 70–99)
Potassium: 5.4 mmol/L — ABNORMAL HIGH (ref 3.5–5.1)
Potassium: 3.6 mmol/L (ref 3.5–5.1)
Potassium: 4 mmol/L (ref 3.5–5.1)
Sodium: 134 mmol/L — ABNORMAL LOW (ref 135–145)
Sodium: 136 mmol/L (ref 135–145)
Sodium: 136 mmol/L (ref 135–145)

## 2018-11-06 LAB — CBC
HCT: 29.9 % — ABNORMAL LOW (ref 36.0–46.0)
Hemoglobin: 8.6 g/dL — ABNORMAL LOW (ref 12.0–15.0)
MCH: 21.6 pg — ABNORMAL LOW (ref 26.0–34.0)
MCHC: 28.8 g/dL — ABNORMAL LOW (ref 30.0–36.0)
MCV: 74.9 fL — ABNORMAL LOW (ref 80.0–100.0)
Platelets: 266 10*3/uL (ref 150–400)
RBC: 3.99 MIL/uL (ref 3.87–5.11)
RDW: 25.4 % — ABNORMAL HIGH (ref 11.5–15.5)
WBC: 4.4 10*3/uL (ref 4.0–10.5)
nRBC: 0 % (ref 0.0–0.2)

## 2018-11-06 LAB — URINE CULTURE: Culture: <10000 — AB

## 2018-11-06 LAB — HIV ANTIBODY (ROUTINE TESTING W REFLEX): HIV Screen 4th Generation wRfx: NONREACTIVE

## 2018-11-06 LAB — PROTIME-INR
INR: 1.5 — ABNORMAL HIGH (ref 0.8–1.2)
Prothrombin Time: 18.2 seconds — ABNORMAL HIGH (ref 11.4–15.2)

## 2018-11-06 LAB — MAGNESIUM: Magnesium: 2.2 mg/dL (ref 1.7–2.4)

## 2018-11-06 MED ORDER — METOLAZONE 2.5 MG PO TABS
2.5000 mg | ORAL_TABLET | Freq: Once | ORAL | Status: AC
  Administered 2018-11-06: 10:00:00 2.5 mg via ORAL
  Filled 2018-11-06: qty 1

## 2018-11-06 MED ORDER — SPIRONOLACTONE 12.5 MG HALF TABLET
12.5000 mg | ORAL_TABLET | Freq: Every day | ORAL | Status: DC
  Administered 2018-11-06 – 2018-11-10 (×5): 12.5 mg via ORAL
  Filled 2018-11-06 (×5): qty 1

## 2018-11-06 MED ORDER — POTASSIUM CHLORIDE CRYS ER 20 MEQ PO TBCR
20.0000 meq | EXTENDED_RELEASE_TABLET | Freq: Once | ORAL | Status: AC
  Administered 2018-11-06: 20 meq via ORAL
  Filled 2018-11-06: qty 1

## 2018-11-06 MED ORDER — SPIRONOLACTONE 12.5 MG HALF TABLET
12.5000 mg | ORAL_TABLET | Freq: Every day | ORAL | Status: DC
  Filled 2018-11-06: qty 1

## 2018-11-06 NOTE — Care Management Important Message (Signed)
Important Message  Patient Details  Name: [name] MRN: [id number] Date of Birth: 11-Sep-1933   Medicare Important Message Given:  Yes     Shelda Altes 11/06/2018, 11:58 AM

## 2018-11-06 NOTE — Progress Notes (Signed)
Patient ID: [name], female   DOB: 19-Jan-1934, 85 y.o.   MRN: [id number]     Advanced Heart Failure Rounding Note  PCP-Cardiologist: Virl Axe, MD   Subjective:    Feeling better today but still mildly tachypneic and short of breath with ambulation.  Legs feel less swollen.  Unna boots on.  Good diuresis yesterday, weight down about 4 lbs.   She got 1 unit PRBCs yesterday, no overt bleeding.  Still pending CBC and INR this morning.   Creatinine stable, K 5.4.   Objective:   Weight Range: 76.2 kg Body mass index is 26.69 kg/m.   Vital Signs:   Temp:  [97.5 F (36.4 C)-98.3 F (36.8 C)] 98.3 F (36.8 C) (07/03 0526) Pulse Rate:  [63-83] 65 (07/03 0526) Resp:  [18-20] 20 (07/03 0526) BP: (117-153)/(54-60) 129/58 (07/03 0526) SpO2:  [91 %-99 %] 95 % (07/03 0526) Weight:  [76.2 kg] 76.2 kg (07/03 0530) Last BM Date: 11/05/18  Weight change: Filed Weights   11/04/18 1832 11/05/18 0438 11/06/18 0530  Weight: 78.4 kg 78.5 kg 76.2 kg    Intake/Output:   Intake/Output Summary (Last 24 hours) at 11/06/2018 0857 Last data filed at 11/06/2018 0431 Gross per 24 hour  Intake 520 ml  Output 3800 ml  Net -3280 ml      Physical Exam    General:  Well appearing. No resp difficulty HEENT: Normal Neck: Supple. JVP 14 cm. Carotids 2+ bilat; no bruits. No lymphadenopathy or thyromegaly appreciated. Cor: PMI nondisplaced. Regular rate & rhythm. No rubs, gallops or murmurs. Lungs: Clear Abdomen: Soft, nontender, nondistended. No hepatosplenomegaly. No bruits or masses. Good bowel sounds. Extremities: No cyanosis, clubbing, rash, edema Neuro: Alert & orientedx3, cranial nerves grossly intact. moves all 4 extremities w/o difficulty. Affect pleasant   Telemetry   Atrial fibrillation in 80s (personally reviewed)  Labs    CBC Recent Labs    11/03/18 1353 11/04/18 1322 11/05/18 0406  WBC 3.9* 5.4  --   NEUTROABS 2.3 4.3  --   HGB 7.4 Repeated and verified X2.* 7.8*  8.4*  HCT 24.0* 28.5* 29.3*  MCV 68.2* 74.4*  --   PLT 291.0 298  --    Basic Metabolic Panel Recent Labs    11/05/18 0406 11/06/18 0402  NA 137 134*  K 3.7 5.4*  CL 96* 93*  CO2 31 29  GLUCOSE 99 104*  BUN 19 19  CREATININE 1.38* 1.23*  CALCIUM 8.7* 8.6*  MG 2.5* 2.2   Liver Function Tests Recent Labs    11/04/18 1715  AST 20  ALT 10  ALKPHOS 90  BILITOT 1.3*  PROT 5.9*  ALBUMIN 3.0*   No results for input(s): LIPASE, AMYLASE in the last 72 hours. Cardiac Enzymes No results for input(s): CKTOTAL, CKMB, CKMBINDEX, TROPONINI in the last 72 hours.  BNP: BNP (last 3 results) Recent Labs    10/06/18 2220 11/04/18 1715  BNP 235.7* 369.3*    ProBNP (last 3 results) No results for input(s): PROBNP in the last 8760 hours.   D-Dimer No results for input(s): DDIMER in the last 72 hours. Hemoglobin A1C No results for input(s): HGBA1C in the last 72 hours. Fasting Lipid Panel No results for input(s): CHOL, HDL, LDLCALC, TRIG, CHOLHDL, LDLDIRECT in the last 72 hours. Thyroid Function Tests Recent Labs    11/04/18 1840  TSH 3.682    Other results:   Imaging     No results found.   Medications:  Scheduled Medications:  sodium chloride   Intravenous Once   feeding supplement (ENSURE ENLIVE)  237 mL Oral BID BM   furosemide  80 mg Intravenous BID   metolazone  2.5 mg Oral Once   sodium chloride flush  3 mL Intravenous Q12H     Infusions:  sodium chloride       PRN Medications:  sodium chloride, acetaminophen, ALPRAZolam, ondansetron (ZOFRAN) IV, sodium chloride flush, zolpidem   Assessment/Plan   1. Acute on chronic diastolic CHF: Echo in 6/57 showed EF 60-65%, mild RV dilation with normal systolic function, PASP 93 mmHg.  She had remote nonischemic cardiomyopathy with recovery of EF, has [employer] ICD.  Elevated PA pressure by echo (not sure if primary or secondary), and suspect that there is significant RV dysfunction. Atrial  fibrillation likely contributes, but this appears to be permanent now as she has failed multiple anti-arrhythmics and is reasonably rate-controlled. She diuresed well yesterday with Lasix + metolazone, weight down 4 lbs.   - Lasix 80 mg IV bid again today with a dose of metolazone 2.5.  - K surprisingly elevated despite metolazone use and stable creatinine => will stop spironolactone for now.  Repeat K in afternoon, may be able to start back on spironolactone eventually.  2. Pulmonary hypertension: PA systolic pressure estimated 93 mmHg on echo. This may be group 2 PH due to elevated left atrial pressure from LV diastolic dysfunction.  However, it is possible that she has a component of group 1 (primary pulmonary hypertension) or group 3 (due to intrinsic lung disease, ?COPD) pulmonary hypertension. CTA chest from 10/07/18 showed no PE and probable pulmonary edema, no definite interstitial lung disease.  - After diuresis, will do RHC (probably Monday).  3. CKD: Stage 3.  Creatinine 1.23 today, lower.  Follow creatinine closely with diuresis.  4. Anemia: Fe deficiency.  Hgb 7.8 at admit, 8.4 yesterday after 1 unit PRBCs.  She denies overt bleeding.  She is Fe deficient.  - Needs CBC today (ordered).  - Fe being replaced IV.   - May need GI involvement (per primary service, waiting for FOBT).  5. Atrial fibrillation: Permanent now.  Rate control is reasonable.  She has failed amiodarone, Tikosyn, and flecainide as well as multiple cardioversions.  - Warfarin is on hold with anemia, ?GI bleeding.  Awaiting today's INR.   Length of Stay: 2  Loralie Champagne, MD  11/06/2018, 8:57 AM  Advanced Heart Failure Team Pager [phone] (M-F; 7a - 4p)  Please contact [facility] for night-coverage after hours (4p -7a ) and weekends on [email or web address]

## 2018-11-06 NOTE — Progress Notes (Signed)
Initial Nutrition Assessment  DOCUMENTATION CODES:   Not applicable  INTERVENTION: Ensure Enlive po BID, each supplement provides 350 kcal and 20 grams of protein  Magic cup TID with meals, each supplement provides 290 kcal and 9 grams of protein (pt would like to try all flavors)  MVI   NUTRITION DIAGNOSIS:   Increased nutrient needs related to chronic illness, post-op healing(CHF, recent hip fracture requiring home rehab) as evidenced by estimated needs, energy intake < 75% for > 7 days, per patient/family report.    GOAL:   Patient will meet greater than or equal to 90% of their needs    MONITOR:   PO intake, Supplement acceptance, Weight trends, Labs, I & O's  REASON FOR ASSESSMENT:   Malnutrition Screening Tool    ASSESSMENT:  85 year old female with medical history significant of chronic CHF with AICD in place, a-fib, CKD3, TIA, chronic anemia, renal cell carcinoma s/p nephrectomy in early 2000's, recent hip fracture s/p ORIF presented to ED with increasing SOB and fatigue. Pt admitted with acute CHF and postopertive anemia   Per chart review, pt sent to SNF for rehab s/p surgical repair of rt hip fracture in early June. She became anemic after surgery, required blood transfusion. Pt placed on oral iron while at SNF and continued with iron tablet at home. Daughter of pt reports that she has difficulty with completing rehab activities, is quick to fatigue and feels SOB after walking 66ft.  Pt received 1 unit PRBCs on 7/2 and per chart review reports feeling better today; mild tachy and SOB with ambulation. Weight is down 4lbs s/p diuresis yesterday. Updated edema assessment unavailable at this time. 7/2 assessment; pt with +3BLE  Spoke with pt via phone. She reports not usually being a big eater, but having decreased appetite/intake x 3 months. Pt stated that the plates sent to her room are too large. She only cares for  grits, toast and a glass of orange juice for  breakfast and ordered a fruit tray and soup for lunch.  At home she prepares meals for herself and recalls chicken with mixed vegetables, soups, and sandwiches. Patient reports daily ONS and stated that she had 12 cases waiting for her at home. RD encouraged several small meals throughout the day when experiencing SOB symptoms and suggested preparing larger meals when feeling well that she can reheat through out the week.   Patient could not recall UBW and stated that she has been having fluid issues for so long that it was hard to remember. Pt reported that her daughter has called attention to recent wt losses and estimated 20lb loss in the last year.   Per chart review, weights have been trending down in the past 6 months. Pt with 14lb (7.7%) loss since 01/20 - insignificant for time frame Suspect pt with some degree of fat/muscle loss masked by reported chronic edema  Medications reviewed and include: Ensure BID, Lasix  Labs: Hgb 8.6 (L)  NUTRITION - FOCUSED PHYSICAL EXAM:    Diet Order:   Diet Order            Diet 2 gram sodium Room service appropriate? Yes; Fluid consistency: Thin; Fluid restriction: 2000 mL Fluid  Diet effective now              EDUCATION NEEDS:   Education needs have been addressed  Skin:  Skin Assessment: Reviewed RN Assessment  Last BM:  7/2: type 4 (brown;med)  Height:   Ht Readings from  Last 1 Encounters:  11/04/18 5' 6.5" (1.689 m)    Weight:   Wt Readings from Last 1 Encounters:  11/06/18 76.2 kg    Ideal Body Weight:  60.2 kg  BMI:  Body mass index is 26.69 kg/m.  Estimated Nutritional Needs:   Kcal:  1500 -1700 (based on IBW 60.5kg)  Protein:  72-85g/kg/IBW  Fluid:  2L per MD   Lajuan Lines, RD, LDN  After Hours/Weekend Pager: [phone]

## 2018-11-06 NOTE — Progress Notes (Signed)
[facility] Progress Note  Patient: [name] OVF:[id number]   PCP: Elby Beck, FNP DOB: April 16, 1934   DOA: 11/04/2018   DOS: 11/06/2018   Date of Service: the patient was seen and examined on 11/06/2018  Brief hospital course: Pt. with PMH of chronic combined CHF, AICD in situ, A. fib, CKD 3, CAD, TIA; admitted on 11/04/2018, presented with complaint of fatigue and tiredness as well as the leg swelling, was found to have acute on chronic decompensated heart failure secondary to symptomatic anemia. Currently further plan is continue supporting Hb and further work-up as well as treatment for heart failure.  Subjective: Still feeling fatigued but improved since yesterday.  No nausea no vomiting.  Had a BM but did not report to RN therefore we do not have any FOBT results.  Denies any blood in the stool.  Assessment and Plan: 1.  Acute on chronic diastolic congestive heart failure New York Heart Association class IV complicated by postoperative anemia leading to high-output heart failure. Potentially group 2 pulmonary hypertension with a component of Group 1 or group 3  Echo 6/20 EF 60 to 65% but with severe pulmonary hypertension. Patient states that she drinks 4 bottles of water daily (22 ounces?) She dislikes having to go to the bathroom all the time because of the torsemide Cardiology consulted. Appreciate assistance. Currently on IV Lasix 80 mg twice daily as well as metolazone 2.5 mg daily. Also on Aldactone 12.5 mg daily. Right heart cath scheduled probably on Monday after adequate diuresis.  2.  Multifactorial anemia- Post operative blood loss anemia, anemia chronic kidney disease as well as potential slow GI loss. Also iron deficiency. Iron level 16. Baseline hemoglobin 9-10 On presentation hemoglobin 7.4. After PRBC currently 8.4. MCV 68 and RDW 25+. Check FOBT, if positive will require GI input.  As the patient is on anticoagulation. Currently holding  anticoagulation.  3.  Permanent atrial fibrillation: Patient on long-term warfarin currently rate controlled with current medication regimen.   Pacemaker AICD is in place.   Will hold warfarin  4.  COPD:  Currently stable and compensated.  Continue current medication management.  No evidence of wheezing on examination.  5.  Chronic bilateral pleural effusions:  Likely related to heart failure.  Lasix dosing has been increased markedly and hopefully patient will diurese well and can wear those pleural effusions.  Will follow up with a chest x-ray in the next couple of days.  Currently there is no repeat chest x-ray ordered.  6.  Peripheral arterial disease:  Noted on lower extremities. Check ABI  8.  History of renal cell carcinoma now with single kidney status post nephrectomy in the early 2000's.  Diet: Cardiac diet DVT Prophylaxis: SCD.  Advance goals of care discussion: full code  Family Communication: no family was present at bedside, at the time of interview.  Discussed with the family on 11/06/2018  Disposition:  Discharge to Home .  Consultants: cardiology  Procedures: none  Scheduled Meds: . feeding supplement (ENSURE ENLIVE)  237 mL Oral BID BM  . furosemide  80 mg Intravenous BID  . sodium chloride flush  3 mL Intravenous Q12H  . spironolactone  12.5 mg Oral Daily   Continuous Infusions: . sodium chloride     PRN Meds: sodium chloride, acetaminophen, ALPRAZolam, ondansetron (ZOFRAN) IV, sodium chloride flush, zolpidem Antibiotics: Anti-infectives (From admission, onward)   None       Objective: Physical Exam: Vitals:   11/05/18 1945 11/06/18 0100 11/06/18 8416  11/06/18 0530  BP: (!) 153/60 117/60 (!) 129/58   Pulse: 67  65   Resp: 20 20 20    Temp: 97.9 F (36.6 C) (!) 97.5 F (36.4 C) 98.3 F (36.8 C)   TempSrc: Oral Oral Oral   SpO2: 96% 97% 95%   Weight:    76.2 kg  Height:        Intake/Output Summary (Last 24 hours) at 11/06/2018 2052  Last data filed at 11/06/2018 2039 Gross per 24 hour  Intake 240 ml  Output 3100 ml  Net -2860 ml   Filed Weights   11/04/18 1832 11/05/18 0438 11/06/18 0530  Weight: 78.4 kg 78.5 kg 76.2 kg   General: alert and oriented to time, place, and person. Appear in moderate distress, affect appropriate Eyes: PERRL, Conjunctiva normal ENT: Oral Mucosa Clear, moist  Neck: difficult to assess  JVD, no Abnormal Mass Or lumps Cardiovascular: S1 and S2 Present, aortic systolic  Murmur, peripheral pulses symmetrical Respiratory: normal respiratory effort, Bilateral Air entry equal and Decreased, no use of accessory muscle, bilateral basal Crackles, no wheezes Abdomen: Bowel Sound present, Soft and no tenderness, no hernia Skin: no rashes  Extremities: bilateral  Pedal edema, no calf tenderness Neurologic: normal without focal findings, mental status, speech normal, alert and oriented x3, PERLA, Motor strength 5/5 and symmetric and sensation grossly normal to light touch Gait not checked due to patient safety concerns  Data Reviewed: CBC: Recent Labs  Lab 11/03/18 1353 11/04/18 1322 11/05/18 0406 11/06/18 1003  WBC 3.9* 5.4  --  4.4  NEUTROABS 2.3 4.3  --   --   HGB 7.4 Repeated and verified X2.* 7.8* 8.4* 8.6*  HCT 24.0* 28.5* 29.3* 29.9*  MCV 68.2* 74.4*  --  74.9*  PLT 291.0 298  --  470   Basic Metabolic Panel: Recent Labs  Lab 11/04/18 1322 11/04/18 1840 11/05/18 0406 11/06/18 0402 11/06/18 1003 11/06/18 1401  NA 135  --  137 134* 136 136  K 3.7  --  3.7 5.4* 3.6 4.0  CL 94*  --  96* 93* 94* 92*  CO2 30  --  31 29 29  34*  GLUCOSE 101*  --  99 104* 114* 139*  BUN 19  --  19 19 17 18   CREATININE 1.27*  --  1.38* 1.23* 1.20* 1.29*  CALCIUM 9.0  --  8.7* 8.6* 9.0 9.2  MG  --  2.2 2.5* 2.2  --   --     Liver Function Tests: Recent Labs  Lab 11/04/18 1715  AST 20  ALT 10  ALKPHOS 90  BILITOT 1.3*  PROT 5.9*  ALBUMIN 3.0*   No results for input(s): LIPASE, AMYLASE in  the last 168 hours. No results for input(s): AMMONIA in the last 168 hours. Coagulation Profile: Recent Labs  Lab 11/03/18 1406 11/04/18 1322 11/05/18 0406 11/06/18 1003  INR 3.6* 2.5* 2.4* 1.5*   Cardiac Enzymes: No results for input(s): CKTOTAL, CKMB, CKMBINDEX, TROPONINI in the last 168 hours. BNP (last 3 results) No results for input(s): PROBNP in the last 8760 hours. CBG: No results for input(s): GLUCAP in the last 168 hours. Studies: No results found.   Time spent: 35 minutes  Author: Berle Mull, MD [facility] 11/06/2018 8:52 PM  To reach On-call, see care teams to locate the attending and reach out to them via [email or web address]. If 7PM-7AM, please contact night-coverage If you still have difficulty reaching the attending provider, please page the [facility] (Director on Call) for  [facility] on amion for assistance.

## 2018-11-07 DIAGNOSIS — I4891 Unspecified atrial fibrillation: Secondary | ICD-10-CM | POA: Diagnosis not present

## 2018-11-07 DIAGNOSIS — Z79891 Long term (current) use of opiate analgesic: Secondary | ICD-10-CM | POA: Diagnosis not present

## 2018-11-07 DIAGNOSIS — I959 Hypotension, unspecified: Secondary | ICD-10-CM | POA: Diagnosis not present

## 2018-11-07 DIAGNOSIS — D631 Anemia in chronic kidney disease: Secondary | ICD-10-CM | POA: Diagnosis not present

## 2018-11-07 DIAGNOSIS — Z8673 Personal history of transient ischemic attack (TIA), and cerebral infarction without residual deficits: Secondary | ICD-10-CM

## 2018-11-07 DIAGNOSIS — M1990 Unspecified osteoarthritis, unspecified site: Secondary | ICD-10-CM | POA: Diagnosis not present

## 2018-11-07 DIAGNOSIS — S72132D Displaced apophyseal fracture of left femur, subsequent encounter for closed fracture with routine healing: Secondary | ICD-10-CM | POA: Diagnosis not present

## 2018-11-07 DIAGNOSIS — I130 Hypertensive heart and chronic kidney disease with heart failure and stage 1 through stage 4 chronic kidney disease, or unspecified chronic kidney disease: Secondary | ICD-10-CM | POA: Diagnosis not present

## 2018-11-07 DIAGNOSIS — Z9181 History of falling: Secondary | ICD-10-CM

## 2018-11-07 DIAGNOSIS — Z85528 Personal history of other malignant neoplasm of kidney: Secondary | ICD-10-CM | POA: Diagnosis not present

## 2018-11-07 DIAGNOSIS — Z90710 Acquired absence of both cervix and uterus: Secondary | ICD-10-CM

## 2018-11-07 DIAGNOSIS — Z96643 Presence of artificial hip joint, bilateral: Secondary | ICD-10-CM

## 2018-11-07 DIAGNOSIS — Z950 Presence of cardiac pacemaker: Secondary | ICD-10-CM

## 2018-11-07 DIAGNOSIS — I10 Essential (primary) hypertension: Secondary | ICD-10-CM

## 2018-11-07 DIAGNOSIS — I2510 Atherosclerotic heart disease of native coronary artery without angina pectoris: Secondary | ICD-10-CM | POA: Diagnosis not present

## 2018-11-07 DIAGNOSIS — I5032 Chronic diastolic (congestive) heart failure: Secondary | ICD-10-CM | POA: Diagnosis not present

## 2018-11-07 DIAGNOSIS — Z7901 Long term (current) use of anticoagulants: Secondary | ICD-10-CM | POA: Diagnosis not present

## 2018-11-07 DIAGNOSIS — N189 Chronic kidney disease, unspecified: Secondary | ICD-10-CM | POA: Diagnosis not present

## 2018-11-07 LAB — BASIC METABOLIC PANEL
Anion gap: 13 (ref 5–15)
BUN: 18 mg/dL (ref 8–23)
CO2: 31 mmol/L (ref 22–32)
Calcium: 8.8 mg/dL — ABNORMAL LOW (ref 8.9–10.3)
Chloride: 91 mmol/L — ABNORMAL LOW (ref 98–111)
Creatinine, Ser: 1.22 mg/dL — ABNORMAL HIGH (ref 0.44–1.00)
GFR calc Af Amer: 48 mL/min — ABNORMAL LOW (ref >60)
GFR calc non Af Amer: 41 mL/min — ABNORMAL LOW (ref >60)
Glucose, Bld: 103 mg/dL — ABNORMAL HIGH (ref 70–99)
Potassium: 3.4 mmol/L — ABNORMAL LOW (ref 3.5–5.1)
Sodium: 135 mmol/L (ref 135–145)

## 2018-11-07 LAB — CBC
HCT: 29.4 % — ABNORMAL LOW (ref 36.0–46.0)
Hemoglobin: 8.5 g/dL — ABNORMAL LOW (ref 12.0–15.0)
MCH: 21.5 pg — ABNORMAL LOW (ref 26.0–34.0)
MCHC: 28.9 g/dL — ABNORMAL LOW (ref 30.0–36.0)
MCV: 74.2 fL — ABNORMAL LOW (ref 80.0–100.0)
Platelets: 265 10*3/uL (ref 150–400)
RBC: 3.96 MIL/uL (ref 3.87–5.11)
RDW: 25.8 % — ABNORMAL HIGH (ref 11.5–15.5)
WBC: 4.7 10*3/uL (ref 4.0–10.5)
nRBC: 0 % (ref 0.0–0.2)

## 2018-11-07 LAB — ANA: Anti Nuclear Antibody (ANA): NEGATIVE

## 2018-11-07 LAB — MAGNESIUM: Magnesium: 2 mg/dL (ref 1.7–2.4)

## 2018-11-07 LAB — PROTIME-INR
INR: 1.6 — ABNORMAL HIGH (ref 0.8–1.2)
Prothrombin Time: 18.8 seconds — ABNORMAL HIGH (ref 11.4–15.2)

## 2018-11-07 LAB — OCCULT BLOOD X 1 CARD TO LAB, STOOL: Fecal Occult Bld: NEGATIVE

## 2018-11-07 MED ORDER — POTASSIUM CHLORIDE CRYS ER 20 MEQ PO TBCR
40.0000 meq | EXTENDED_RELEASE_TABLET | Freq: Once | ORAL | Status: AC
  Administered 2018-11-07: 40 meq via ORAL
  Filled 2018-11-07: qty 2

## 2018-11-07 NOTE — Progress Notes (Signed)
Pt had BM but NT flushed as urine mixed, educated it was for OB stool specimen not CDiff will attempt with next BM, pt educated to have staff not flush specimen

## 2018-11-07 NOTE — Progress Notes (Addendum)
[facility] Progress Note  Patient: [name] TXM:[id number]   PCP: Elby Beck, FNP DOB: 12/13/1933   DOA: 11/04/2018   DOS: 11/07/2018   Date of Service: the patient was seen and examined on 11/07/2018  Brief hospital course: Pt. with PMH of chronic combined CHF, AICD in situ, A. fib, CKD 3, CAD, TIA; admitted on 11/04/2018, presented with complaint of fatigue and tiredness as well as the leg swelling, was found to have acute on chronic decompensated heart failure secondary to symptomatic anemia. Currently further plan is continue supporting Hb and further work-up as well as treatment for heart failure.  Subjective: No nausea no vomiting.  Breathing better.  No fever no chills.  Assessment and Plan: 1.  Acute on chronic diastolic congestive heart failure New York Heart Association class IV complicated by postoperative anemia leading to high-output heart failure. Potentially group 2 pulmonary hypertension with a component of Group 1 or group 3  Echo 6/20 EF 60 to 65% but with severe pulmonary hypertension. Patient states that she drinks 4 bottles of water daily (22 ounces?) She dislikes having to go to the bathroom all the time because of the torsemide Cardiology consulted. Appreciate assistance. Currently on IV Lasix 80 mg twice daily as well as metolazone 2.5 mg daily. Also on Aldactone 12.5 mg daily. Right heart cath scheduled probably on Monday after adequate diuresis.  2.  Multifactorial anemia- Post operative blood loss anemia, anemia chronic kidney disease as well as potential slow GI loss. Also iron deficiency. Iron level 16. Baseline hemoglobin 9-10 On presentation hemoglobin 7.4. After PRBC currently 8.4. MCV 68 and RDW 25+. Negative FOBT, Currently holding anticoagulation.  Should resume it with IV heparin therapy.  Will discuss with cardiology tomorrow.  3.  Permanent atrial fibrillation: Patient on long-term warfarin currently rate controlled with  current medication regimen.   Pacemaker AICD is in place.   Will hold warfarin  4.  COPD:  Acute hypoxia Currently stable and compensated.  Continue current medication management.  No evidence of wheezing on examination. Requires oxygen on exertion.  5.  Chronic bilateral pleural effusions:  Likely related to heart failure.  Lasix dosing has been increased markedly and hopefully patient will diurese well and can wear those pleural effusions.  Will follow up with a chest x-ray in the next couple of days.  Currently there is no repeat chest x-ray ordered.  6.  Peripheral arterial disease:  Noted on lower extremities. Check ABI  7.  History of renal cell carcinoma now with single kidney status post nephrectomy in the early 2000's.  8.  Hypokalemia. Chronic kidney disease stage III. Creatinine clearance 37. Around baseline. Avoid nephrotoxic medications. Potassium 3.4.  Received 40 mg of potassium this morning. Received 20 mg on 17 2020 with which her potassium improved from 3.6-4.0. No indication for further replacement.  Diet: Cardiac diet DVT Prophylaxis: SCD.  Advance goals of care discussion: full code  Family Communication: no family was present at bedside, at the time of interview.  Discussed with the family on 7/3  /2020  Disposition:  Discharge to Home .  Consultants: cardiology  Procedures: none  Scheduled Meds: . feeding supplement (ENSURE ENLIVE)  237 mL Oral BID BM  . furosemide  80 mg Intravenous BID  . sodium chloride flush  3 mL Intravenous Q12H  . spironolactone  12.5 mg Oral Daily   Continuous Infusions: . sodium chloride     PRN Meds: sodium chloride, acetaminophen, ALPRAZolam, ondansetron (ZOFRAN) IV, sodium  chloride flush, zolpidem Antibiotics: Anti-infectives (From admission, onward)   None       Objective: Physical Exam: Vitals:   11/06/18 2354 11/07/18 0452 11/07/18 0603 11/07/18 1128  BP: (!) 131/53 119/72  (!) 127/52  Pulse: 75  (!) 58  64  Resp: 20 18  19   Temp: 98.2 F (36.8 C) 98.3 F (36.8 C)  (!) 97.3 F (36.3 C)  TempSrc: Oral Oral  Oral  SpO2: 93% 100%  94%  Weight:   74.3 kg   Height:        Intake/Output Summary (Last 24 hours) at 11/07/2018 1814 Last data filed at 11/07/2018 1600 Gross per 24 hour  Intake 723 ml  Output 2450 ml  Net -1727 ml   Filed Weights   11/05/18 0438 11/06/18 0530 11/07/18 0603  Weight: 78.5 kg 76.2 kg 74.3 kg   General: alert and oriented to time, place, and person. Appear in moderate distress, affect appropriate Eyes: PERRL, Conjunctiva normal ENT: Oral Mucosa Clear, moist  Neck: difficult to assess  JVD, no Abnormal Mass Or lumps Cardiovascular: S1 and S2 Present, aortic systolic  Murmur, peripheral pulses symmetrical Respiratory: normal respiratory effort, Bilateral Air entry equal and Decreased, no use of accessory muscle, bilateral basal Crackles, no wheezes Abdomen: Bowel Sound present, Soft and no tenderness, no hernia Skin: no rashes  Extremities: bilateral  Pedal edema, no calf tenderness Neurologic: normal without focal findings, mental status, speech normal, alert and oriented x3, PERLA, Motor strength 5/5 and symmetric and sensation grossly normal to light touch Gait not checked due to patient safety concerns  Data Reviewed: CBC: Recent Labs  Lab 11/03/18 1353 11/04/18 1322 11/05/18 0406 11/06/18 1003 11/07/18 0443  WBC 3.9* 5.4  --  4.4 4.7  NEUTROABS 2.3 4.3  --   --   --   HGB 7.4 Repeated and verified X2.* 7.8* 8.4* 8.6* 8.5*  HCT 24.0* 28.5* 29.3* 29.9* 29.4*  MCV 68.2* 74.4*  --  74.9* 74.2*  PLT 291.0 298  --  266 591   Basic Metabolic Panel: Recent Labs  Lab 11/04/18 1840 11/05/18 0406 11/06/18 0402 11/06/18 1003 11/06/18 1401 11/07/18 0443  NA  --  137 134* 136 136 135  K  --  3.7 5.4* 3.6 4.0 3.4*  CL  --  96* 93* 94* 92* 91*  CO2  --  31 29 29  34* 31  GLUCOSE  --  99 104* 114* 139* 103*  BUN  --  19 19 17 18 18   CREATININE   --  1.38* 1.23* 1.20* 1.29* 1.22*  CALCIUM  --  8.7* 8.6* 9.0 9.2 8.8*  MG 2.2 2.5* 2.2  --   --  2.0    Liver Function Tests: Recent Labs  Lab 11/04/18 1715  AST 20  ALT 10  ALKPHOS 90  BILITOT 1.3*  PROT 5.9*  ALBUMIN 3.0*   No results for input(s): LIPASE, AMYLASE in the last 168 hours. No results for input(s): AMMONIA in the last 168 hours. Coagulation Profile: Recent Labs  Lab 11/03/18 1406 11/04/18 1322 11/05/18 0406 11/06/18 1003 11/07/18 0443  INR 3.6* 2.5* 2.4* 1.5* 1.6*   Cardiac Enzymes: No results for input(s): CKTOTAL, CKMB, CKMBINDEX, TROPONINI in the last 168 hours. BNP (last 3 results) No results for input(s): PROBNP in the last 8760 hours. CBG: No results for input(s): GLUCAP in the last 168 hours. Studies: No results found.   Time spent: 35 minutes  Author: Berle Mull, MD [facility] 11/07/2018 6:14  PM  To reach On-call, see care teams to locate the attending and reach out to them via [email or web address]. If 7PM-7AM, please contact night-coverage If you still have difficulty reaching the attending provider, please page the [facility] (Director on Call) for [facility] on amion for assistance.

## 2018-11-07 NOTE — Evaluation (Signed)
Physical Therapy Evaluation Patient Details Name: [name] MRN: [id number] DOB: Dec 24, 1933 Today's Date: 11/07/2018   History of Present Illness  85 y.o. female with medical history significant of chronic diastolic heart failure with AICD in place, atrial fibrillation on warfarin, chronic kidney disease stage III, coronary artery disease, TIA, chronic anemia with a baseline hemoglobin of 11 but recent hemoglobin much lower than that, renal cell carcinoma treated in the early 2000's with nephrectomy now with a single kidney, and recent left hip fracture status post ORIF in early June 2020, comes to hospital with anemia from PCP admitted for CHF 11/04/18.  Clinical Impression  PTA pt living alone in single story home with 1 step to enter with assistance from family intermittently after being d/c'd from [facility] after rehab for L hip fx ORIF. Marland Kitchen Pt independent with short distance ambulation but reports SoB and lethargy prior to admission. Family was assisting with bathing, shopping and cooking/cleaning. Pt is currently limited in safe mobility by oxygen desaturation (see General Comments) as well as decreased strength and mobility secondary to hip fx. PT recommending HHPT level rehab at discharge (Pt requests not [facility] as they were unable to provide PT since pt d/c from [facility]) as well as intermittent family supervision. PT will continue to follow acutely.     Follow Up Recommendations Home health PT;Supervision - Intermittent    Equipment Recommendations  None recommended by PT    Recommendations for Other Services       Precautions / Restrictions Precautions Precautions: Fall Restrictions Weight Bearing Restrictions: No LLE Weight Bearing: Partial weight bearing      Mobility  Bed Mobility               General bed mobility comments: ambulating in room on entry  Transfers Overall transfer level: Needs assistance Equipment used: Rolling walker (2  wheeled) Transfers: Sit to/from Stand Sit to Stand: Min guard         General transfer comment: min guard for sit>stand from bed surface, good hand placement, power up and steadying  Ambulation/Gait Ambulation/Gait assistance: Min guard Gait Distance (Feet): 120 Feet Assistive device: Rolling walker (2 wheeled) Gait Pattern/deviations: Step-through pattern;Decreased step length - right;Decreased step length - left;Decreased stance time - left;Decreased weight shift to left Gait velocity: slowed Gait velocity interpretation: 1.31 - 2.62 ft/sec, indicative of limited community ambulator General Gait Details: min guard for safety, with slow, steady gait, decreased weightbearing and stance time on L LE          Balance Overall balance assessment: Needs assistance;History of Falls Sitting-balance support: No upper extremity supported;Feet supported Sitting balance-Leahy Scale: Good     Standing balance support: No upper extremity supported;During functional activity Standing balance-Leahy Scale: Fair                               Pertinent Vitals/Pain Pain Assessment: No/denies pain    Home Living Family/patient expects to be discharged to:: Private residence Living Arrangements: Alone Available Help at Discharge: Family;Available PRN/intermittently Type of Home: House Home Access: Stairs to enter   Entrance Stairs-Number of Steps: 1 Home Layout: One level Home Equipment: Tub bench;Bedside commode;Walker - 2 wheels;Walker - 4 wheels      Prior Function Level of Independence: Needs assistance   Gait / Transfers Assistance Needed: small distances with RW  ADL's / Homemaking Assistance Needed: family assist for bathing and dressing and granddaughter assist with shopping  cleaning and cooking           Extremity/Trunk Assessment        Lower Extremity Assessment Lower Extremity Assessment: Generalized weakness RLE Deficits / Details: Unna boot in  place, Knee ROM lacking full range due to edema RLE Sensation: WNL RLE Coordination: WNL LLE Deficits / Details: Unna boot in place knee and ankle ROM lacking full range due to edema, hip flex 2/5, knee ext 2/5, calf tender to touch  LLE Sensation: WNL LLE Coordination: WNL    Cervical / Trunk Assessment Cervical / Trunk Assessment: Normal  Communication   Communication: No difficulties  Cognition Arousal/Alertness: Awake/alert Behavior During Therapy: WFL for tasks assessed/performed Overall Cognitive Status: Within Functional Limits for tasks assessed                                        General Comments General comments (skin integrity, edema, etc.): With ambulation pt SaO2 on RA dropped to 84%O2, pt with no reports of lightheadedness or dizziness, 2/4 DoE with ambulation noted. SaO2 rebounded to 92% with vc for pursed lipped breathing        Assessment/Plan    PT Assessment Patient needs continued PT services  PT Problem List Decreased strength;Decreased activity tolerance;Decreased balance;Decreased mobility;Decreased knowledge of use of DME;Decreased knowledge of precautions;Pain       PT Treatment Interventions DME instruction;Gait training;Stair training;Functional mobility training;Therapeutic activities;Therapeutic exercise;Balance training;Cognitive remediation;Patient/family education    PT Goals (Current goals can be found in the Care Plan section)  Acute Rehab PT Goals Patient Stated Goal: walk without limp PT Goal Formulation: With patient Time For Goal Achievement: 11/21/18 Potential to Achieve Goals: Good    Frequency Min 3X/week   Barriers to discharge Decreased caregiver support         AM-PAC PT "6 Clicks" Mobility  Outcome Measure Help needed turning from your back to your side while in a flat bed without using bedrails?: None Help needed moving from lying on your back to sitting on the side of a flat bed without using  bedrails?: None Help needed moving to and from a bed to a chair (including a wheelchair)?: None Help needed standing up from a chair using your arms (e.g., wheelchair or bedside chair)?: None Help needed to walk in hospital room?: None Help needed climbing 3-5 steps with a railing? : A Little 6 Click Score: 23    End of Session Equipment Utilized During Treatment: Gait belt Activity Tolerance: Patient tolerated treatment well Patient left: Other (comment)(on EoB with MD in room) Nurse Communication: Mobility status PT Visit Diagnosis: Unsteadiness on feet (R26.81);History of falling (Z91.81);Pain;Difficulty in walking, not elsewhere classified (R26.2) Pain - Right/Left: Left Pain - part of body: Leg;Hip    Time: 1540-0867 PT Time Calculation (min) (ACUTE ONLY): 24 min   Charges:   PT Evaluation $PT Eval Moderate Complexity: 1 Mod PT Treatments $Gait Training: 8-22 mins        [place] Sara B. Migdalia Dk PT, DPT Acute Rehabilitation Services Pager [phone] Office [phone]   [place] 11/07/2018, 10:45 AM

## 2018-11-07 NOTE — Plan of Care (Signed)
  Problem: Activity: Goal: Risk for activity intolerance will decrease Outcome: Progressing   Problem: Safety: Goal: Ability to remain free from injury will improve Outcome: Progressing   

## 2018-11-07 NOTE — Progress Notes (Signed)
Progress Note  Patient Name: [name] Date of Encounter: 11/07/2018  Primary Cardiologist: Virl Axe, MD   Subjective   Dyspnea improving; no CP  Inpatient Medications    Scheduled Meds: . feeding supplement (ENSURE ENLIVE)  237 mL Oral BID BM  . furosemide  80 mg Intravenous BID  . potassium chloride  40 mEq Oral Once  . sodium chloride flush  3 mL Intravenous Q12H  . spironolactone  12.5 mg Oral Daily   Continuous Infusions: . sodium chloride     PRN Meds: sodium chloride, acetaminophen, ALPRAZolam, ondansetron (ZOFRAN) IV, sodium chloride flush, zolpidem   Vital Signs    Vitals:   11/06/18 0530 11/06/18 2354 11/07/18 0452 11/07/18 0603  BP:  (!) 131/53 119/72   Pulse:  75 (!) 58   Resp:  20 18   Temp:  98.2 F (36.8 C) 98.3 F (36.8 C)   TempSrc:  Oral Oral   SpO2:  93% 100%   Weight: 76.2 kg   74.3 kg  Height:        Intake/Output Summary (Last 24 hours) at 11/07/2018 0716 Last data filed at 11/07/2018 0457 Gross per 24 hour  Intake 240 ml  Output 1500 ml  Net -1260 ml   Last 3 Weights 11/07/2018 11/06/2018 11/05/2018  Weight (lbs) 163 lb 12.8 oz 167 lb 14.4 oz 173 lb  Weight (kg) 74.299 kg 76.159 kg 78.472 kg      Telemetry    Atrial fibrillation- Personally Reviewed  Physical Exam   GEN: No acute distress.   Neck: No JVD Cardiac: RRR, no murmurs, rubs, or gallops.  Respiratory: Clear to auscultation bilaterally. GI: Soft, nontender, non-distended  MS: No edema Neuro:  Nonfocal  Psych: Normal affect   Labs    High Sensitivity Troponin:   Recent Labs  Lab 11/04/18 1715 11/04/18 1840  TROPONINIHS 18* 27*      Chemistry Recent Labs  Lab 11/04/18 1715  11/06/18 1003 11/06/18 1401 11/07/18 0443  NA  --    < > 136 136 135  K  --    < > 3.6 4.0 3.4*  CL  --    < > 94* 92* 91*  CO2  --    < > 29 34* 31  GLUCOSE  --    < > 114* 139* 103*  BUN  --    < > 17 18 18   CREATININE  --    < > 1.20* 1.29* 1.22*  CALCIUM  --    < >  9.0 9.2 8.8*  PROT 5.9*  --   --   --   --   ALBUMIN 3.0*  --   --   --   --   AST 20  --   --   --   --   ALT 10  --   --   --   --   ALKPHOS 90  --   --   --   --   BILITOT 1.3*  --   --   --   --   GFRNONAA  --    < > 42* 39* 41*  GFRAA  --    < > 49* 45* 48*  ANIONGAP  --    < > 13 10 13    < > = values in this interval not displayed.     Hematology Recent Labs  Lab 11/04/18 1322 11/05/18 0406 11/06/18 1003 11/07/18 0443  WBC 5.4  --  4.4  4.7  RBC 3.83*  --  3.99 3.96  HGB 7.8* 8.4* 8.6* 8.5*  HCT 28.5* 29.3* 29.9* 29.4*  MCV 74.4*  --  74.9* 74.2*  MCH 20.4*  --  21.6* 21.5*  MCHC 27.4*  --  28.8* 28.9*  RDW 26.5*  --  25.4* 25.8*  PLT 298  --  266 265    BNP Recent Labs  Lab 11/04/18 1715  BNP 369.3*     Patient Profile     85 y.o. female with past medical history of permanent atrial fibrillation, nonobstructive coronary disease by cardiac catheterization 1504, chronic diastolic congestive heart failure, history of renal cell carcinoma status post right kidney resection, chronic stage III kidney disease being treated for acute on chronic diastolic congestive heart failure.  Most recent echocardiogram June 2020 showed normal LV systolic function, severe pulmonary hypertension, mild to moderate tricuspid regurgitation.  Assessment & Plan    1 acute on chronic diastolic congestive heart failure-I/O-1260; wt 74.3 Kg.  She remains volume overloaded on examination.  Continue Lasix and spironolactone.  Follow renal function.  2 pulmonary hypertension-likely group 2.  Previous chest CT showed no pulmonary embolus or interstitial lung disease.  Plan is for right heart catheterization Monday following diuresis.  3 chronic stage III kidney disease-patient has a history of nephrectomy.  Follow renal function closely with diuresis.  4 anemia-she has had 1 unit packed red blood cells since admission.  Anticoagulation is on hold.  5 permanent atrial fibrillation-heart rate  is controlled.  Coumadin is on hold given anemia and upcoming right heart catheterization.  Will need to resume once able.  For questions or updates, please contact [place] Please consult [email or web address] for contact info under        Signed, Kirk Ruths, MD  11/07/2018, 7:16 AM

## 2018-11-08 LAB — BASIC METABOLIC PANEL
Anion gap: 11 (ref 5–15)
BUN: 22 mg/dL (ref 8–23)
CO2: 34 mmol/L — ABNORMAL HIGH (ref 22–32)
Calcium: 9 mg/dL (ref 8.9–10.3)
Chloride: 91 mmol/L — ABNORMAL LOW (ref 98–111)
Creatinine, Ser: 1.3 mg/dL — ABNORMAL HIGH (ref 0.44–1.00)
GFR calc Af Amer: 44 mL/min — ABNORMAL LOW (ref >60)
GFR calc non Af Amer: 38 mL/min — ABNORMAL LOW (ref >60)
Glucose, Bld: 101 mg/dL — ABNORMAL HIGH (ref 70–99)
Potassium: 3.4 mmol/L — ABNORMAL LOW (ref 3.5–5.1)
Sodium: 136 mmol/L (ref 135–145)

## 2018-11-08 LAB — PROTIME-INR
INR: 1.4 — ABNORMAL HIGH (ref 0.8–1.2)
Prothrombin Time: 17.4 seconds — ABNORMAL HIGH (ref 11.4–15.2)

## 2018-11-08 LAB — CBC
HCT: 29.5 % — ABNORMAL LOW (ref 36.0–46.0)
Hemoglobin: 8.5 g/dL — ABNORMAL LOW (ref 12.0–15.0)
MCH: 21.8 pg — ABNORMAL LOW (ref 26.0–34.0)
MCHC: 28.8 g/dL — ABNORMAL LOW (ref 30.0–36.0)
MCV: 75.6 fL — ABNORMAL LOW (ref 80.0–100.0)
Platelets: 259 10*3/uL (ref 150–400)
RBC: 3.9 MIL/uL (ref 3.87–5.11)
RDW: 26.5 % — ABNORMAL HIGH (ref 11.5–15.5)
WBC: 4.5 10*3/uL (ref 4.0–10.5)
nRBC: 0 % (ref 0.0–0.2)

## 2018-11-08 LAB — RHEUMATOID FACTOR: Rheumatoid fact SerPl-aCnc: <10 IU/mL (ref 0.0–13.9)

## 2018-11-08 LAB — MAGNESIUM: Magnesium: 1.9 mg/dL (ref 1.7–2.4)

## 2018-11-08 LAB — HEPARIN LEVEL (UNFRACTIONATED): Heparin Unfractionated: 0.13 IU/mL — ABNORMAL LOW (ref 0.30–0.70)

## 2018-11-08 MED ORDER — POTASSIUM CHLORIDE CRYS ER 20 MEQ PO TBCR
20.0000 meq | EXTENDED_RELEASE_TABLET | Freq: Two times a day (BID) | ORAL | Status: DC
  Administered 2018-11-08 – 2018-11-10 (×5): 20 meq via ORAL
  Filled 2018-11-08 (×5): qty 1

## 2018-11-08 MED ORDER — B COMPLEX-C PO TABS
1.0000 | ORAL_TABLET | Freq: Every day | ORAL | Status: DC
  Administered 2018-11-08 – 2018-11-10 (×3): 1 via ORAL
  Filled 2018-11-08 (×3): qty 1

## 2018-11-08 MED ORDER — FERROUS SULFATE 325 (65 FE) MG PO TABS
325.0000 mg | ORAL_TABLET | Freq: Two times a day (BID) | ORAL | Status: DC
  Administered 2018-11-08 – 2018-11-10 (×4): 325 mg via ORAL
  Filled 2018-11-08 (×4): qty 1

## 2018-11-08 MED ORDER — HEPARIN (PORCINE) 25000 UT/250ML-% IV SOLN
1350.0000 [IU]/h | INTRAVENOUS | Status: DC
  Administered 2018-11-08: 1000 [IU]/h via INTRAVENOUS
  Administered 2018-11-09: 12:00:00 1250 [IU]/h via INTRAVENOUS
  Filled 2018-11-08 (×3): qty 250

## 2018-11-08 NOTE — Progress Notes (Signed)
[facility] Progress Note  Patient: [name] DPO:[id number]   PCP: Elby Beck, FNP DOB: [birth year]   DOA: 11/04/2018   DOS: 11/08/2018   Date of Service: the patient was seen and examined on 11/08/2018  Brief hospital course: Pt. with PMH of chronic combined CHF, AICD in situ, A. fib, CKD 3, CAD, TIA; admitted on 11/04/2018, presented with complaint of fatigue and tiredness as well as the leg swelling, was found to have acute on chronic decompensated heart failure secondary to symptomatic anemia. Currently further plan is continue supporting Hb and further work-up as well as treatment for heart failure.  Subjective: Breathing better no nausea no vomiting no fever no chills.  No blood in the stool reported.  Assessment and Plan: 1.  Acute on chronic diastolic congestive heart failure New York Heart Association class IV complicated by postoperative anemia leading to high-output heart failure. Potentially group 2 pulmonary hypertension with a component of Group 1 or group 3  Echo 6/20 EF 60 to 65% but with severe pulmonary hypertension. Patient states that she drinks 4 bottles of water daily (22 ounces?) She dislikes having to go to the bathroom all the time because of the torsemide Cardiology consulted. Appreciate assistance. Currently on IV Lasix 80 mg twice daily as well as metolazone 2.5 mg daily. Also on Aldactone 12.5 mg daily. Right heart cath scheduled on Monday  2.  Multifactorial anemia- Post operative blood loss anemia, anemia chronic kidney disease as well as potential slow GI loss. Also iron deficiency. Iron level 16. Baseline hemoglobin 9-10 On presentation hemoglobin 7.4. After PRBC currently 8.4. MCV 68 and RDW 25+. Negative FOBT, Discussed with cardiology and I recommended to start the patient on heparin.  Will monitor for bleeding in the hospital.  3.  Permanent atrial fibrillation: Patient on long-term warfarin currently rate controlled with  current medication regimen.   Pacemaker AICD is in place.   Will hold warfarin, currently on therapeutic heparin.  4.  COPD:  Acute hypoxia Currently stable and compensated.  Continue current medication management.  No evidence of wheezing on examination. Requires oxygen on exertion.  5.  Chronic bilateral pleural effusions:  Likely related to heart failure.  Lasix dosing has been increased markedly and hopefully patient will diurese well and can wear those pleural effusions.  Will follow up with a chest x-ray in the next couple of days.  Currently there is no repeat chest x-ray ordered.  6.  Peripheral arterial disease:  Noted on lower extremities. Check ABI  7.  History of renal cell carcinoma now with single kidney status post nephrectomy in the early 2000's.  8.  Hypokalemia. Chronic kidney disease stage III. Creatinine clearance 37. Around baseline. Avoid nephrotoxic medications. Potassium 3.4.  Received 40 mg of potassium this morning. Received 20 mg on 17 2020 with which her potassium improved from 3.6-4.0. No indication for further replacement.  Diet: Cardiac diet DVT Prophylaxis: SCD.  Advance goals of care discussion: full code  Family Communication: no family was present at bedside, at the time of interview.  Discussed with the family on 11/07/2018  Disposition:  Discharge to Home .  Consultants: cardiology  Procedures: none  Scheduled Meds: . B-complex with vitamin C  1 tablet Oral Daily  . feeding supplement (ENSURE ENLIVE)  237 mL Oral BID BM  . ferrous sulfate  325 mg Oral BID WC  . furosemide  80 mg Intravenous BID  . potassium chloride  20 mEq Oral BID  . sodium  chloride flush  3 mL Intravenous Q12H  . spironolactone  12.5 mg Oral Daily   Continuous Infusions: . sodium chloride    . heparin 1,000 Units/hr (11/08/18 1401)   PRN Meds: sodium chloride, acetaminophen, ALPRAZolam, ondansetron (ZOFRAN) IV, sodium chloride flush, zolpidem Antibiotics:  Anti-infectives (From admission, onward)   None       Objective: Physical Exam: Vitals:   11/08/18 0500 11/08/18 0600 11/08/18 0904 11/08/18 1202  BP:  (!) 112/54 (!) 119/53 122/67  Pulse:  63 67 61  Resp:    20  Temp:  98.5 F (36.9 C) 98 F (36.7 C) 97.9 F (36.6 C)  TempSrc:  Oral Oral Oral  SpO2:  94% 100% 98%  Weight: 72.8 kg     Height:        Intake/Output Summary (Last 24 hours) at 11/08/2018 1850 Last data filed at 11/08/2018 1247 Gross per 24 hour  Intake 120 ml  Output 1800 ml  Net -1680 ml   Filed Weights   11/06/18 0530 11/07/18 0603 11/08/18 0500  Weight: 76.2 kg 74.3 kg 72.8 kg   General: alert and oriented to time, place, and person. Appear in moderate distress, affect appropriate Eyes: PERRL, Conjunctiva normal ENT: Oral Mucosa Clear, moist  Neck: difficult to assess  JVD, no Abnormal Mass Or lumps Cardiovascular: S1 and S2 Present, aortic systolic  Murmur, peripheral pulses symmetrical Respiratory: normal respiratory effort, Bilateral Air entry equal and Decreased, no use of accessory muscle, bilateral basal Crackles, no wheezes Abdomen: Bowel Sound present, Soft and no tenderness, no hernia Skin: no rashes  Extremities: bilateral  Pedal edema, no calf tenderness Neurologic: normal without focal findings, mental status, speech normal, alert and oriented x3, PERLA, Motor strength 5/5 and symmetric and sensation grossly normal to light touch Gait not checked due to patient safety concerns  Data Reviewed: CBC: Recent Labs  Lab 11/03/18 1353 11/04/18 1322 11/05/18 0406 11/06/18 1003 11/07/18 0443 11/08/18 0447  WBC 3.9* 5.4  --  4.4 4.7 4.5  NEUTROABS 2.3 4.3  --   --   --   --   HGB 7.4 Repeated and verified X2.* 7.8* 8.4* 8.6* 8.5* 8.5*  HCT 24.0* 28.5* 29.3* 29.9* 29.4* 29.5*  MCV 68.2* 74.4*  --  74.9* 74.2* 75.6*  PLT 291.0 298  --  266 265 326   Basic Metabolic Panel: Recent Labs  Lab 11/04/18 1840 11/05/18 0406 11/06/18 0402  11/06/18 1003 11/06/18 1401 11/07/18 0443 11/08/18 0447  NA  --  137 134* 136 136 135 136  K  --  3.7 5.4* 3.6 4.0 3.4* 3.4*  CL  --  96* 93* 94* 92* 91* 91*  CO2  --  31 29 29  34* 31 34*  GLUCOSE  --  99 104* 114* 139* 103* 101*  BUN  --  19 19 17 18 18 22   CREATININE  --  1.38* 1.23* 1.20* 1.29* 1.22* 1.30*  CALCIUM  --  8.7* 8.6* 9.0 9.2 8.8* 9.0  MG 2.2 2.5* 2.2  --   --  2.0 1.9    Liver Function Tests: Recent Labs  Lab 11/04/18 1715  AST 20  ALT 10  ALKPHOS 90  BILITOT 1.3*  PROT 5.9*  ALBUMIN 3.0*   No results for input(s): LIPASE, AMYLASE in the last 168 hours. No results for input(s): AMMONIA in the last 168 hours. Coagulation Profile: Recent Labs  Lab 11/04/18 1322 11/05/18 0406 11/06/18 1003 11/07/18 0443 11/08/18 0447  INR 2.5* 2.4* 1.5* 1.6*  1.4*   Cardiac Enzymes: No results for input(s): CKTOTAL, CKMB, CKMBINDEX, TROPONINI in the last 168 hours. BNP (last 3 results) No results for input(s): PROBNP in the last 8760 hours. CBG: No results for input(s): GLUCAP in the last 168 hours. Studies: No results found.   Time spent: 35 minutes  Author: Berle Mull, MD [facility] 11/08/2018 6:50 PM  To reach On-call, see care teams to locate the attending and reach out to them via [email or web address]. If 7PM-7AM, please contact night-coverage If you still have difficulty reaching the attending provider, please page the [facility] (Director on Call) for [facility] on amion for assistance.

## 2018-11-08 NOTE — Progress Notes (Signed)
Brief Pharmacy Note:  17 y/oF on IV heparin while PTA warfarin for a-fib on hold. Patient with multifactorial anemia, Hgb improved with transfusion of PRBCs. FOBT negative.  Initial heparin level = 0.13 units/mL, subtherapeutic  No bleeding or infusion issues reported per nursing Hgb 8.5 low/stable, Pltc WNL   Plan: Increase heparin infusion to 1250 units/hr Heparin level 8 hours after rate change Daily CBC, heparin level Monitor closely for s/sx of bleeding   Lindell Spar, PharmD, BCPS Clinical Pharmacist  11/08/2018 7:16 PM

## 2018-11-08 NOTE — Progress Notes (Signed)
Progress Note  Patient Name: [name] Date of Encounter: 11/08/2018  Primary Cardiologist: Virl Axe, MD   Subjective   Denies CP; dyspnea continues to improve.  Inpatient Medications    Scheduled Meds: . feeding supplement (ENSURE ENLIVE)  237 mL Oral BID BM  . furosemide  80 mg Intravenous BID  . potassium chloride  20 mEq Oral BID  . sodium chloride flush  3 mL Intravenous Q12H  . spironolactone  12.5 mg Oral Daily   Continuous Infusions: . sodium chloride     PRN Meds: sodium chloride, acetaminophen, ALPRAZolam, ondansetron (ZOFRAN) IV, sodium chloride flush, zolpidem   Vital Signs    Vitals:   11/08/18 0000 11/08/18 0500 11/08/18 0600 11/08/18 0904  BP: (!) 125/58  (!) 112/54 (!) 119/53  Pulse: 64  63 67  Resp:      Temp: 98.1 F (36.7 C)  98.5 F (36.9 C) 98 F (36.7 C)  TempSrc: Oral  Oral Oral  SpO2: 94%  94% 100%  Weight:  72.8 kg    Height:        Intake/Output Summary (Last 24 hours) at 11/08/2018 1016 Last data filed at 11/08/2018 0921 Gross per 24 hour  Intake 420 ml  Output 1700 ml  Net -1280 ml   Last 3 Weights 11/08/2018 11/07/2018 11/06/2018  Weight (lbs) 160 lb 7.9 oz 163 lb 12.8 oz 167 lb 14.4 oz  Weight (kg) 72.8 kg 74.299 kg 76.159 kg      Telemetry    Atrial fibrillation with occasional ventricular pacing.- Personally Reviewed  Physical Exam   GEN: NAD  Neck: supple Cardiac: irregular Respiratory: CTA GI: Soft, NT/ND MS: No edema Neuro:  Grossly intact Psych: Normal affect   Labs    High Sensitivity Troponin:   Recent Labs  Lab 11/04/18 1715 11/04/18 1840  TROPONINIHS 18* 27*      Chemistry Recent Labs  Lab 11/04/18 1715  11/06/18 1401 11/07/18 0443 11/08/18 0447  NA  --    < > 136 135 136  K  --    < > 4.0 3.4* 3.4*  CL  --    < > 92* 91* 91*  CO2  --    < > 34* 31 34*  GLUCOSE  --    < > 139* 103* 101*  BUN  --    < > 18 18 22   CREATININE  --    < > 1.29* 1.22* 1.30*  CALCIUM  --    < > 9.2 8.8*  9.0  PROT 5.9*  --   --   --   --   ALBUMIN 3.0*  --   --   --   --   AST 20  --   --   --   --   ALT 10  --   --   --   --   ALKPHOS 90  --   --   --   --   BILITOT 1.3*  --   --   --   --   GFRNONAA  --    < > 39* 41* 38*  GFRAA  --    < > 45* 48* 44*  ANIONGAP  --    < > 10 13 11    < > = values in this interval not displayed.     Hematology Recent Labs  Lab 11/06/18 1003 11/07/18 0443 11/08/18 0447  WBC 4.4 4.7 4.5  RBC 3.99 3.96 3.90  HGB 8.6*  8.5* 8.5*  HCT 29.9* 29.4* 29.5*  MCV 74.9* 74.2* 75.6*  MCH 21.6* 21.5* 21.8*  MCHC 28.8* 28.9* 28.8*  RDW 25.4* 25.8* 26.5*  PLT 266 265 259    BNP Recent Labs  Lab 11/04/18 1715  BNP 369.3*     Patient Profile     85 y.o. female with past medical history of permanent atrial fibrillation, nonobstructive coronary disease by cardiac catheterization 7425, chronic diastolic congestive heart failure, history of renal cell carcinoma status post right kidney resection, chronic stage III kidney disease being treated for acute on chronic diastolic congestive heart failure.  Most recent echocardiogram June 2020 showed normal LV systolic function, severe pulmonary hypertension, mild to moderate tricuspid regurgitation.  Assessment & Plan    1 acute on chronic diastolic congestive heart failure-I/O-1847; wt 72.8 Kg.  Volume status improving. Continue Lasix and spironolactone.  Follow renal function.  2 pulmonary hypertension-likely group 2.  Previous chest CT showed no pulmonary embolus or interstitial lung disease.  Plan is for right heart catheterization Monday following diuresis.  3 chronic stage III kidney disease-patient has a history of nephrectomy. Recheck Bmet in AM.   4 anemia-she has had 1 unit packed red blood cells since admission.  Anticoagulation is on hold. Hgb unchanged this AM.   5 permanent atrial fibrillation-heart rate is controlled.  Coumadin is on hold given anemia and upcoming right heart catheterization.   Will need to resume once able.  For questions or updates, please contact [place] Please consult [email or web address] for contact info under        Signed, Kirk Ruths, MD  11/08/2018, 10:16 AM

## 2018-11-08 NOTE — Progress Notes (Signed)
ANTICOAGULATION CONSULT NOTE - Initial Consult  Pharmacy Consult for Heparin Indication: atrial fibrillation  Allergies  Allergen Reactions  . Dilaudid [Hydromorphone] Other (See Comments)    Excessive Somnolence  Needing Narcan.  . Codeine Nausea And Vomiting  . Morphine And Related Nausea And Vomiting  . Sulfonamide Derivatives Other (See Comments)    Dry mouth    Patient Measurements: Height: 5' 6.5" (168.9 cm) Weight: 160 lb 7.9 oz (72.8 kg) IBW/kg (Calculated) : 60.45 Heparin Dosing Weight: 72.8 kg  Vital Signs: Temp: 98 F (36.7 C) (07/05 0904) Temp Source: Oral (07/05 0904) BP: 119/53 (07/05 0904) Pulse Rate: 67 (07/05 0904)  Labs: Recent Labs    11/06/18 1003 11/06/18 1401 11/07/18 0443 11/08/18 0447  HGB 8.6*  --  8.5* 8.5*  HCT 29.9*  --  29.4* 29.5*  PLT 266  --  265 259  LABPROT 18.2*  --  18.8* 17.4*  INR 1.5*  --  1.6* 1.4*  CREATININE 1.20* 1.29* 1.22* 1.30*    Estimated Creatinine Clearance: 34.4 mL/min (A) (by C-G formula based on SCr of 1.3 mg/dL (H)).   Medical History: Past Medical History:  Diagnosis Date  . Adjustment disorder with anxiety   . Anginal pain ([place])   . Arthritis    "some in my hands" (11/11/2012)  . Atrial fibrillation ([place])   . Automatic implantable cardioverter-defibrillator in situ   . Blood in stool   . Carotid artery stenosis 09/2007   a. 09/2007: 60-79% bilateral (stable); b. 10/2008: 40-59% R 60-79%   . Chest pain, unspecified   . Chronic airway obstruction, not elsewhere classified   . Chronic bronchitis ([place])    "get it some; not q year" (11/11/2012)  . Chronic diastolic CHF (congestive heart failure) ([place]) 06/04/2013  . Chronic kidney disease, unspecified   . Coronary artery disease    non-obstructive by 2006 cath  . Frequent UTI    "get them a couple times/yr" (11/11/2012)  . Hemorrhage of rectum and anus   . High cholesterol   . History of blood transfusion 04/2011   "after hip OR" (11/11/2012)  .  Hypertension 05/20/2011  . Hypertr obst cardiomyop   . Hypotension, unspecified    cardiac cath 2006..nonobstructive CAD 30-40s lesions.Marland KitchenETT 1/09 nondiagnostic due to poor HR response..Right Renal Cancer 2003  . Long term (current) use of anticoagulants   . Malignant neoplasm of kidney, except pelvis   . Osteoarthritis of right hip   . Other and unspecified coagulation defects   . PONV (postoperative nausea and vomiting)   . Renal cancer ([place]) 06/2001   Right  . Secondary cardiomyopathy, unspecified   . Sinus bradycardia   . Urge incontinence     Assessment:  Anticoag: Warf PTA for PAF held with symptomatic anemia - INR 1.4. Dopplers neg for DVT. Start IV heparin . Hgb 8.5. Plts 259  Goal of Therapy:  Heparin level 0.3-0.7 units/ml Monitor platelets by anticoagulation protocol: Yes   Plan:  - Start IV heparin (no bolus) at 1000 units/hr. - Check heparin level in 6 hrs. - Daily HL and CBC  Taijuan Serviss S. Alford Highland, PharmD, BCPS Clinical Staff Pharmacist Eilene Ghazi Stilling[place] 11/08/2018,11:33 AM

## 2018-11-09 ENCOUNTER — Encounter (HOSPITAL_COMMUNITY)
Admission: EM | Disposition: A | Discharge status: Home Health | Payer: Self-pay | Source: Home / Self Care | Attending: Internal Medicine

## 2018-11-09 ENCOUNTER — Encounter (HOSPITAL_COMMUNITY): Payer: Self-pay | Admitting: Cardiology

## 2018-11-09 DIAGNOSIS — I509 Heart failure, unspecified: Secondary | ICD-10-CM

## 2018-11-09 DIAGNOSIS — I272 Pulmonary hypertension, unspecified: Secondary | ICD-10-CM

## 2018-11-09 DIAGNOSIS — D649 Anemia, unspecified: Secondary | ICD-10-CM

## 2018-11-09 DIAGNOSIS — N183 Chronic kidney disease, stage 3 (moderate): Secondary | ICD-10-CM

## 2018-11-09 HISTORY — PX: RIGHT HEART CATH: CATH118263

## 2018-11-09 LAB — POCT I-STAT EG7
Acid-Base Excess: 11 mmol/L — ABNORMAL HIGH (ref 0.0–2.0)
Acid-Base Excess: 11 mmol/L — ABNORMAL HIGH (ref 0.0–2.0)
Acid-Base Excess: 12 mmol/L — ABNORMAL HIGH (ref 0.0–2.0)
Bicarbonate: 34.1 mmol/L — ABNORMAL HIGH (ref 20.0–28.0)
Bicarbonate: 36.3 mmol/L — ABNORMAL HIGH (ref 20.0–28.0)
Bicarbonate: 36.6 mmol/L — ABNORMAL HIGH (ref 20.0–28.0)
Calcium, Ion: 1.07 mmol/L — ABNORMAL LOW (ref 1.15–1.40)
Calcium, Ion: 1.12 mmol/L — ABNORMAL LOW (ref 1.15–1.40)
Calcium, Ion: 1.14 mmol/L — ABNORMAL LOW (ref 1.15–1.40)
HCT: 30 % — ABNORMAL LOW (ref 36.0–46.0)
HCT: 30 % — ABNORMAL LOW (ref 36.0–46.0)
HCT: 32 % — ABNORMAL LOW (ref 36.0–46.0)
Hemoglobin: 10.2 g/dL — ABNORMAL LOW (ref 12.0–15.0)
Hemoglobin: 10.2 g/dL — ABNORMAL LOW (ref 12.0–15.0)
Hemoglobin: 10.9 g/dL — ABNORMAL LOW (ref 12.0–15.0)
O2 Saturation: 71 %
O2 Saturation: 71 %
O2 Saturation: 98 %
Potassium: 3.2 mmol/L — ABNORMAL LOW (ref 3.5–5.1)
Potassium: 3.2 mmol/L — ABNORMAL LOW (ref 3.5–5.1)
Potassium: 3.3 mmol/L — ABNORMAL LOW (ref 3.5–5.1)
Sodium: 136 mmol/L (ref 135–145)
Sodium: 137 mmol/L (ref 135–145)
Sodium: 138 mmol/L (ref 135–145)
TCO2: 35 mmol/L — ABNORMAL HIGH (ref 22–32)
TCO2: 38 mmol/L — ABNORMAL HIGH (ref 22–32)
TCO2: 38 mmol/L — ABNORMAL HIGH (ref 22–32)
pCO2, Ven: 37.3 mmHg — ABNORMAL LOW (ref 44.0–60.0)
pCO2, Ven: 49.7 mmHg (ref 44.0–60.0)
pCO2, Ven: 50.3 mmHg (ref 44.0–60.0)
pH, Ven: 7.466 — ABNORMAL HIGH (ref 7.250–7.430)
pH, Ven: 7.475 — ABNORMAL HIGH (ref 7.250–7.430)
pH, Ven: 7.569 — ABNORMAL HIGH (ref 7.250–7.430)
pO2, Ven: 35 mmHg (ref 32.0–45.0)
pO2, Ven: 36 mmHg (ref 32.0–45.0)
pO2, Ven: 93 mmHg — ABNORMAL HIGH (ref 32.0–45.0)

## 2018-11-09 LAB — CBC WITH DIFFERENTIAL/PLATELET
Abs Immature Granulocytes: 0 10*3/uL (ref 0.00–0.07)
Basophils Absolute: 0.1 10*3/uL (ref 0.0–0.1)
Basophils Relative: 4 %
Eosinophils Absolute: 0.1 10*3/uL (ref 0.0–0.5)
Eosinophils Relative: 2 %
HCT: 29.7 % — ABNORMAL LOW (ref 36.0–46.0)
Hemoglobin: 8.4 g/dL — ABNORMAL LOW (ref 12.0–15.0)
Lymphocytes Relative: 22 %
Lymphs Abs: 0.7 10*3/uL (ref 0.7–4.0)
MCH: 21.4 pg — ABNORMAL LOW (ref 26.0–34.0)
MCHC: 28.3 g/dL — ABNORMAL LOW (ref 30.0–36.0)
MCV: 75.8 fL — ABNORMAL LOW (ref 80.0–100.0)
Monocytes Absolute: 0.3 10*3/uL (ref 0.1–1.0)
Monocytes Relative: 8 %
Neutro Abs: 2 10*3/uL (ref 1.7–7.7)
Neutrophils Relative %: 64 %
Platelets: 249 10*3/uL (ref 150–400)
RBC: 3.92 MIL/uL (ref 3.87–5.11)
RDW: 26.8 % — ABNORMAL HIGH (ref 11.5–15.5)
WBC: 3.2 10*3/uL — ABNORMAL LOW (ref 4.0–10.5)
nRBC: 0 % (ref 0.0–0.2)
nRBC: 0 /100 WBC

## 2018-11-09 LAB — CULTURE, BLOOD (ROUTINE X 2)
Culture: NO GROWTH
Culture: NO GROWTH

## 2018-11-09 LAB — CBC
HCT: 29.8 % — ABNORMAL LOW (ref 36.0–46.0)
Hemoglobin: 8.6 g/dL — ABNORMAL LOW (ref 12.0–15.0)
MCH: 21.6 pg — ABNORMAL LOW (ref 26.0–34.0)
MCHC: 28.9 g/dL — ABNORMAL LOW (ref 30.0–36.0)
MCV: 74.7 fL — ABNORMAL LOW (ref 80.0–100.0)
Platelets: 256 10*3/uL (ref 150–400)
RBC: 3.99 MIL/uL (ref 3.87–5.11)
RDW: 26.7 % — ABNORMAL HIGH (ref 11.5–15.5)
WBC: 4.3 10*3/uL (ref 4.0–10.5)
nRBC: 0 % (ref 0.0–0.2)

## 2018-11-09 LAB — BASIC METABOLIC PANEL
Anion gap: 11 (ref 5–15)
BUN: 26 mg/dL — ABNORMAL HIGH (ref 8–23)
CO2: 33 mmol/L — ABNORMAL HIGH (ref 22–32)
Calcium: 8.9 mg/dL (ref 8.9–10.3)
Chloride: 92 mmol/L — ABNORMAL LOW (ref 98–111)
Creatinine, Ser: 1.44 mg/dL — ABNORMAL HIGH (ref 0.44–1.00)
GFR calc Af Amer: 39 mL/min — ABNORMAL LOW (ref >60)
GFR calc non Af Amer: 34 mL/min — ABNORMAL LOW (ref >60)
Glucose, Bld: 106 mg/dL — ABNORMAL HIGH (ref 70–99)
Potassium: 3.4 mmol/L — ABNORMAL LOW (ref 3.5–5.1)
Sodium: 136 mmol/L (ref 135–145)

## 2018-11-09 LAB — HEPARIN LEVEL (UNFRACTIONATED)
Heparin Unfractionated: 0.34 IU/mL (ref 0.30–0.70)
Heparin Unfractionated: 0.26 IU/mL — ABNORMAL LOW (ref 0.30–0.70)

## 2018-11-09 LAB — PROTIME-INR
INR: 1.2 (ref 0.8–1.2)
Prothrombin Time: 15.1 seconds (ref 11.4–15.2)

## 2018-11-09 LAB — MAGNESIUM: Magnesium: 2 mg/dL (ref 1.7–2.4)

## 2018-11-09 SURGERY — RIGHT HEART CATH
Anesthesia: LOCAL

## 2018-11-09 MED ORDER — LIDOCAINE HCL (PF) 1 % IJ SOLN
INTRAMUSCULAR | Status: AC
  Filled 2018-11-09: qty 30

## 2018-11-09 MED ORDER — POTASSIUM CHLORIDE CRYS ER 20 MEQ PO TBCR
40.0000 meq | EXTENDED_RELEASE_TABLET | Freq: Once | ORAL | Status: AC
  Administered 2018-11-09: 40 meq via ORAL
  Filled 2018-11-09: qty 2

## 2018-11-09 MED ORDER — HEPARIN (PORCINE) IN NACL 1000-0.9 UT/500ML-% IV SOLN
INTRAVENOUS | Status: AC
  Filled 2018-11-09: qty 500

## 2018-11-09 MED ORDER — SODIUM CHLORIDE 0.9% FLUSH
3.0000 mL | Freq: Two times a day (BID) | INTRAVENOUS | Status: DC
  Administered 2018-11-10: 3 mL via INTRAVENOUS

## 2018-11-09 MED ORDER — LIDOCAINE HCL (PF) 1 % IJ SOLN
INTRAMUSCULAR | Status: DC | PRN
  Administered 2018-11-09: 2 mL

## 2018-11-09 MED ORDER — FUROSEMIDE 10 MG/ML IJ SOLN
80.0000 mg | Freq: Two times a day (BID) | INTRAMUSCULAR | Status: DC
  Administered 2018-11-09 – 2018-11-10 (×3): 80 mg via INTRAVENOUS
  Filled 2018-11-09 (×3): qty 8

## 2018-11-09 MED ORDER — WARFARIN SODIUM 3 MG PO TABS
3.0000 mg | ORAL_TABLET | Freq: Once | ORAL | Status: AC
  Administered 2018-11-09: 17:00:00 3 mg via ORAL
  Filled 2018-11-09: qty 1

## 2018-11-09 MED ORDER — SODIUM CHLORIDE 0.9% FLUSH: 3.0000 mL | INTRAVENOUS | Status: DC | PRN

## 2018-11-09 MED ORDER — SODIUM CHLORIDE 0.9 % IV SOLN: INTRAVENOUS | Status: DC

## 2018-11-09 MED ORDER — HEPARIN (PORCINE) IN NACL 1000-0.9 UT/500ML-% IV SOLN
INTRAVENOUS | Status: DC | PRN
  Administered 2018-11-09: 500 mL

## 2018-11-09 MED ORDER — METOLAZONE 2.5 MG PO TABS
2.5000 mg | ORAL_TABLET | Freq: Once | ORAL | Status: AC
  Administered 2018-11-09: 10:00:00 2.5 mg via ORAL
  Filled 2018-11-09: qty 1

## 2018-11-09 MED ORDER — SODIUM CHLORIDE 0.9 % IV SOLN: 250.0000 mL | INTRAVENOUS | Status: DC | PRN

## 2018-11-09 MED ORDER — ASPIRIN 81 MG PO CHEW
81.0000 mg | CHEWABLE_TABLET | ORAL | Status: AC
  Administered 2018-11-09: 07:00:00 81 mg via ORAL
  Filled 2018-11-09: qty 1

## 2018-11-09 MED ORDER — WARFARIN - PHARMACIST DOSING INPATIENT: Freq: Every day | Status: DC

## 2018-11-09 SURGICAL SUPPLY — 7 items
CATH BALLN WEDGE 5F 110CM (CATHETERS) ×2 IMPLANT
COVER DOME SNAP 22 D (MISCELLANEOUS) ×4 IMPLANT
KIT HEART LEFT (KITS) ×2 IMPLANT
PACK CARDIAC CATHETERIZATION (CUSTOM PROCEDURE TRAY) ×2 IMPLANT
SHEATH GLIDE SLENDER 4/5FR (SHEATH) ×2 IMPLANT
TRANSDUCER W/STOPCOCK (MISCELLANEOUS) ×2 IMPLANT
TUBING CIL FLEX 10 FLL-RA (TUBING) ×2 IMPLANT

## 2018-11-09 NOTE — Progress Notes (Signed)
-*[place] for Heparin, Coumadin Indication: atrial fibrillation  Allergies  Allergen Reactions  . Dilaudid [Hydromorphone] Other (See Comments)    Excessive Somnolence  Needing Narcan.  . Codeine Nausea And Vomiting  . Morphine And Related Nausea And Vomiting  . Sulfonamide Derivatives Other (See Comments)    Dry mouth    Patient Measurements: Height: 5' 6.5" (168.9 cm) Weight: 157 lb 14.4 oz (71.6 kg) IBW/kg (Calculated) : 60.45 Heparin Dosing Weight: 72.8 kg  Vital Signs: Temp: 97.8 F (36.6 C) (07/06 1131) Temp Source: Oral (07/06 1131) BP: 118/31 (07/06 1131) Pulse Rate: 74 (07/06 1131)  Labs: Recent Labs    11/07/18 0443 11/08/18 0447 11/08/18 1831 11/09/18 0435 11/09/18 0825 11/09/18 1054 11/09/18 1213  HGB 8.5* 8.5*  --  8.6*  --  8.4*  --   HCT 29.4* 29.5*  --  29.8*  --  29.7*  --   PLT 265 259  --  256  --  249  --   LABPROT 18.8* 17.4*  --   --  15.1  --   --   INR 1.6* 1.4*  --   --  1.2  --   --   HEPARINUNFRC  --   --  0.13* 0.34  --   --  0.26*  CREATININE 1.22* 1.30*  --  1.44*  --   --   --     Estimated Creatinine Clearance: 28.8 mL/min (A) (by C-G formula based on SCr of 1.44 mg/dL (H)).   Medical History: Past Medical History:  Diagnosis Date  . Adjustment disorder with anxiety   . Anginal pain ([place])   . Arthritis    "some in my hands" (11/11/2012)  . Atrial fibrillation ([place])   . Automatic implantable cardioverter-defibrillator in situ   . Blood in stool   . Carotid artery stenosis 09/2007   a. 09/2007: 60-79% bilateral (stable); b. 10/2008: 40-59% R 60-79%   . Chest pain, unspecified   . Chronic airway obstruction, not elsewhere classified   . Chronic bronchitis ([place])    "get it some; not q year" (11/11/2012)  . Chronic diastolic CHF (congestive heart failure) ([place]) 06/04/2013  . Chronic kidney disease, unspecified   . Coronary artery disease    non-obstructive by 2006 cath  . Frequent UTI    "get them a couple times/yr" (11/11/2012)  . Hemorrhage of rectum and anus   . High cholesterol   . History of blood transfusion 04/2011   "after hip OR" (11/11/2012)  . Hypertension 05/20/2011  . Hypertr obst cardiomyop   . Hypotension, unspecified    cardiac cath 2006..nonobstructive CAD 30-40s lesions.Marland KitchenETT 1/09 nondiagnostic due to poor HR response..Right Renal Cancer 2003  . Long term (current) use of anticoagulants   . Malignant neoplasm of kidney, except pelvis   . Osteoarthritis of right hip   . Other and unspecified coagulation defects   . PONV (postoperative nausea and vomiting)   . Renal cancer ([place]) 06/2001   Right  . Secondary cardiomyopathy, unspecified   . Sinus bradycardia   . Urge incontinence    . sodium chloride    . heparin 1,250 Units/hr (11/09/18 1148)      Assessment:  Anticoag: Warf PTA for PAF held with symptomatic anemia - INR 1.4. Dopplers neg for DVT. Start IV heparin . Hgb 8.5. Plts 259  Heparin level this morning is within goal range.  No overt bleeding or complications noted.  Pharmacy asked to resume warfarin tonight.  Heparin level this afternoon now slightly below goal range.   PTA warfarin dose 2 mg daily except 1 mg on Sundays.  Goal of Therapy:  Heparin level 0.3-0.7 units/ml Monitor platelets by anticoagulation protocol: Yes   Plan:  -Increase IV heparin to 1350 units/hr. -Coumadin 3 mg x 1 tonight. -Daily heparin level, INR, and CBC.  Marguerite Olea, [facility] Clinical Pharmacist Phone [phone]  11/09/2018 2:03 PM

## 2018-11-09 NOTE — Progress Notes (Signed)
Physical Therapy Treatment Patient Details Name: [name] MRN: [id number] DOB: 1934-03-28 Today's Date: 11/09/2018    History of Present Illness 85 y.o. female with medical history significant of chronic diastolic heart failure with AICD in place, atrial fibrillation on warfarin, chronic kidney disease stage III, coronary artery disease, TIA, chronic anemia with a baseline hemoglobin of 11 but recent hemoglobin much lower than that, renal cell carcinoma treated in the early 2000's with nephrectomy now with a single kidney, and recent left hip fracture status post ORIF in early June 2020, comes to hospital with anemia from PCP admitted for CHF 11/04/18.    PT Comments    Pt agreeable to walking with therapy this afternoon, after using BSC for increased urination due to LASIK. Pt reports low BP earlier, BP in acceptable range for ambulation. Pt requires min guard for transfers and ambulation of 450 feet with RW. Pt with no complaints of L hip pain and able to walk without limp today. D/c plans remain appropriate at this time. PT will continue to follow acutely.   Follow Up Recommendations  Home health PT;Supervision - Intermittent     Equipment Recommendations  None recommended by PT       Precautions / Restrictions Precautions Precautions: Fall Restrictions Weight Bearing Restrictions: No LLE Weight Bearing: Partial weight bearing    Mobility  Bed Mobility               General bed mobility comments: sitting on EoB on entry   Transfers Overall transfer level: Needs assistance Equipment used: Rolling walker (2 wheeled) Transfers: Sit to/from Stand Sit to Stand: Min guard         General transfer comment: min guard for sit>stand from bed surface, good hand placement, power up and steadying  Ambulation/Gait Ambulation/Gait assistance: Min guard Gait Distance (Feet): 450 Feet Assistive device: Rolling walker (2 wheeled) Gait Pattern/deviations: Step-through  pattern;Decreased step length - right;Decreased step length - left;Decreased stance time - left;Decreased weight shift to left Gait velocity: slowed Gait velocity interpretation: >2.62 ft/sec, indicative of community ambulatory General Gait Details: min guard for safety, with slow, steady gait, pt with no reports of L hip pain today         Balance Overall balance assessment: Needs assistance;History of Falls Sitting-balance support: No upper extremity supported;Feet supported Sitting balance-Leahy Scale: Good     Standing balance support: No upper extremity supported;During functional activity Standing balance-Leahy Scale: Fair                              Cognition Arousal/Alertness: Awake/alert Behavior During Therapy: WFL for tasks assessed/performed Overall Cognitive Status: Within Functional Limits for tasks assessed                                           General Comments General comments (skin integrity, edema, etc.): Pt reported low BP earlier in the day in sitting BP 151/74 with standing BP 137/76, pt with complaints of slight dizziness which dissipated quickly  SaO2 on RA >92%O2 throughout      Pertinent Vitals/Pain Pain Assessment: No/denies pain           PT Goals (current goals can now be found in the care plan section) Acute Rehab PT Goals PT Goal Formulation: With patient Time For Goal Achievement: 11/21/18 Potential to Achieve Goals:  Good Progress towards PT goals: Progressing toward goals    Frequency    Min 3X/week      PT Plan Current plan remains appropriate       AM-PAC PT "6 Clicks" Mobility   Outcome Measure  Help needed turning from your back to your side while in a flat bed without using bedrails?: None Help needed moving from lying on your back to sitting on the side of a flat bed without using bedrails?: None Help needed moving to and from a bed to a chair (including a wheelchair)?: None Help  needed standing up from a chair using your arms (e.g., wheelchair or bedside chair)?: None Help needed to walk in hospital room?: None Help needed climbing 3-5 steps with a railing? : A Little 6 Click Score: 23    End of Session Equipment Utilized During Treatment: Gait belt Activity Tolerance: Patient tolerated treatment well Patient left: Other (comment)(on EoB with MD in room) Nurse Communication: Mobility status PT Visit Diagnosis: Unsteadiness on feet (R26.81);History of falling (Z91.81);Pain;Difficulty in walking, not elsewhere classified (R26.2) Pain - Right/Left: Left Pain - part of body: Leg;Hip     Time: 9191-6606 PT Time Calculation (min) (ACUTE ONLY): 28 min  Charges:  $Gait Training: 23-37 mins                     Markee Matera B. Migdalia Dk PT, DPT Acute Rehabilitation Services Pager [phone] Office [phone]    [place] 11/09/2018, 2:21 PM

## 2018-11-09 NOTE — Progress Notes (Signed)
Patient sitting at the edge of the bed and was complaining of dizziness, BP 80/ 54.Placed on lying position, BP 100/37.Will monitor.

## 2018-11-09 NOTE — Progress Notes (Signed)
PT Cancellation Note  Patient Details Name: [name] MRN: [id number] DOB: 1934-01-13   Cancelled Treatment:    Reason Eval/Treat Not Completed: (P) Patient declined, no reason specified Pt just returned from Cath Lab and wants to eat breakfast. PT will follow back this afternoon as able.  Cythia Bachtel B. Migdalia Dk PT, DPT Acute Rehabilitation Services Pager [phone] Office [phone]    [place] 11/09/2018, 8:58 AM

## 2018-11-09 NOTE — Care Management Important Message (Signed)
Important Message  Patient Details  Name: [name] MRN: [id number] Date of Birth: May 27, 1933   Medicare Important Message Given:  Yes     Shelda Altes 11/09/2018, 1:04 PM

## 2018-11-09 NOTE — Progress Notes (Signed)
[place] for Heparin, Coumadin Indication: atrial fibrillation  Allergies  Allergen Reactions  . Dilaudid [Hydromorphone] Other (See Comments)    Excessive Somnolence  Needing Narcan.  . Codeine Nausea And Vomiting  . Morphine And Related Nausea And Vomiting  . Sulfonamide Derivatives Other (See Comments)    Dry mouth    Patient Measurements: Height: 5' 6.5" (168.9 cm) Weight: 157 lb 14.4 oz (71.6 kg) IBW/kg (Calculated) : 60.45 Heparin Dosing Weight: 72.8 kg  Vital Signs: Temp: 97.8 F (36.6 C) (07/06 1131) Temp Source: Oral (07/06 1131) BP: 118/31 (07/06 1131) Pulse Rate: 74 (07/06 1131)  Labs: Recent Labs    11/07/18 0443 11/08/18 0447 11/08/18 1831 11/09/18 0435 11/09/18 0825 11/09/18 1054  HGB 8.5* 8.5*  --  8.6*  --  8.4*  HCT 29.4* 29.5*  --  29.8*  --  29.7*  PLT 265 259  --  256  --  249  LABPROT 18.8* 17.4*  --   --  15.1  --   INR 1.6* 1.4*  --   --  1.2  --   HEPARINUNFRC  --   --  0.13* 0.34  --   --   CREATININE 1.22* 1.30*  --  1.44*  --   --     Estimated Creatinine Clearance: 28.8 mL/min (A) (by C-G formula based on SCr of 1.44 mg/dL (H)).   Medical History: Past Medical History:  Diagnosis Date  . Adjustment disorder with anxiety   . Anginal pain ([place])   . Arthritis    "some in my hands" (11/11/2012)  . Atrial fibrillation ([place])   . Automatic implantable cardioverter-defibrillator in situ   . Blood in stool   . Carotid artery stenosis 09/2007   a. 09/2007: 60-79% bilateral (stable); b. 10/2008: 40-59% R 60-79%   . Chest pain, unspecified   . Chronic airway obstruction, not elsewhere classified   . Chronic bronchitis ([place])    "get it some; not q year" (11/11/2012)  . Chronic diastolic CHF (congestive heart failure) ([place]) 06/04/2013  . Chronic kidney disease, unspecified   . Coronary artery disease    non-obstructive by 2006 cath  . Frequent UTI    "get them a couple times/yr" (11/11/2012)  .  Hemorrhage of rectum and anus   . High cholesterol   . History of blood transfusion 04/2011   "after hip OR" (11/11/2012)  . Hypertension 05/20/2011  . Hypertr obst cardiomyop   . Hypotension, unspecified    cardiac cath 2006..nonobstructive CAD 30-40s lesions.Marland KitchenETT 1/09 nondiagnostic due to poor HR response..Right Renal Cancer 2003  . Long term (current) use of anticoagulants   . Malignant neoplasm of kidney, except pelvis   . Osteoarthritis of right hip   . Other and unspecified coagulation defects   . PONV (postoperative nausea and vomiting)   . Renal cancer ([place]) 06/2001   Right  . Secondary cardiomyopathy, unspecified   . Sinus bradycardia   . Urge incontinence    . sodium chloride    . heparin 1,250 Units/hr (11/09/18 1148)      Assessment:  Anticoag: Warf PTA for PAF held with symptomatic anemia - INR 1.4. Dopplers neg for DVT. Start IV heparin . Hgb 8.5. Plts 259  Heparin level this morning is within goal range.  No overt bleeding or complications noted.  Pharmacy asked to resume warfarin tonight.  PTA warfarin dose 2 mg daily except 1 mg on Sundays.  Goal of Therapy:  Heparin level 0.3-0.7  units/ml Monitor platelets by anticoagulation protocol: Yes   Plan:  - Continue IV heparin at current rate. -Confirm heparin level now. -Coumadin 3 mg x 1 tonight. -Daily heparin level, INR, and CBC.  Marguerite Olea, [facility] Clinical Pharmacist Phone [phone]  11/09/2018 11:53 AM

## 2018-11-09 NOTE — Progress Notes (Signed)
Patient ID: [name], female   DOB: 1933-10-06, 85 y.o.   MRN: [id number]     Advanced Heart Failure Rounding Note  PCP-Cardiologist: Virl Axe, MD   Subjective:    Breathing has been getting better, weight trending down.   She got 1 unit PRBCs on 7/2, no overt bleeding.  FOBT negative.  Hgb stable 8.6.  Heparin has been restarted.   Creatinine mildly higher at 1.44.   RHC Procedural Findings (7/6): Hemodynamics (mmHg) RA mean 11 RV 61/12 PA 64/18, mean 39 PCWP mean 24 with v-wave to 41 Oxygen saturations: PA 71% AO 98% Cardiac Output (Fick) 7.76  Cardiac Index (Fick) 4.21 PVR 1.9 WU  Objective:   Weight Range: 71.6 kg Body mass index is 25.1 kg/m.   Vital Signs:   Temp:  [97.6 F (36.4 C)-98.2 F (36.8 C)] 98.2 F (36.8 C) (07/06 0609) Pulse Rate:  [44-105] 88 (07/06 0814) Resp:  [10-71] 71 (07/06 0814) BP: (119-148)/(35-120) 143/66 (07/06 0814) SpO2:  [92 %-100 %] 99 % (07/06 0814) Weight:  [71.6 kg] 71.6 kg (07/06 0606) Last BM Date: 11/08/18  Weight change: Filed Weights   11/07/18 0603 11/08/18 0500 11/09/18 0606  Weight: 74.3 kg 72.8 kg 71.6 kg    Intake/Output:   Intake/Output Summary (Last 24 hours) at 11/09/2018 0818 Last data filed at 11/09/2018 0400 Gross per 24 hour  Intake 760.02 ml  Output 2301 ml  Net -1540.98 ml      Physical Exam    General: NAD Neck: JVP 12 cm, no thyromegaly or thyroid nodule.  Lungs: Clear to auscultation bilaterally with normal respiratory effort. CV: Nondisplaced PMI.  Heart irregular S1/S2, no S3/S4, no murmur.  1+ ankle edema.   Abdomen: Soft, nontender, no hepatosplenomegaly, no distention.  Skin: Intact without lesions or rashes.  Neurologic: Alert and oriented x 3.  Psych: Normal affect. Extremities: No clubbing or cyanosis.  HEENT: Normal.    Telemetry   Atrial fibrillation in 70s (personally reviewed)  Labs    CBC Recent Labs    11/08/18 0447 11/09/18 0435  WBC 4.5 4.3  HGB  8.5* 8.6*  HCT 29.5* 29.8*  MCV 75.6* 74.7*  PLT 259 366   Basic Metabolic Panel Recent Labs    11/08/18 0447 11/09/18 0435  NA 136 136  K 3.4* 3.4*  CL 91* 92*  CO2 34* 33*  GLUCOSE 101* 106*  BUN 22 26*  CREATININE 1.30* 1.44*  CALCIUM 9.0 8.9  MG 1.9 2.0   Liver Function Tests No results for input(s): AST, ALT, ALKPHOS, BILITOT, PROT, ALBUMIN in the last 72 hours. No results for input(s): LIPASE, AMYLASE in the last 72 hours. Cardiac Enzymes No results for input(s): CKTOTAL, CKMB, CKMBINDEX, TROPONINI in the last 72 hours.  BNP: BNP (last 3 results) Recent Labs    10/06/18 2220 11/04/18 1715  BNP 235.7* 369.3*    ProBNP (last 3 results) No results for input(s): PROBNP in the last 8760 hours.   D-Dimer No results for input(s): DDIMER in the last 72 hours. Hemoglobin A1C No results for input(s): HGBA1C in the last 72 hours. Fasting Lipid Panel No results for input(s): CHOL, HDL, LDLCALC, TRIG, CHOLHDL, LDLDIRECT in the last 72 hours. Thyroid Function Tests No results for input(s): TSH, T4TOTAL, T3FREE, THYROIDAB in the last 72 hours.  Invalid input(s): FREET3  Other results:   Imaging    No results found.   Medications:     Scheduled Medications: . [MAR Hold] B-complex with vitamin C  1 tablet Oral Daily  . [MAR Hold] feeding supplement (ENSURE ENLIVE)  237 mL Oral BID BM  . [MAR Hold] ferrous sulfate  325 mg Oral BID WC  . furosemide  80 mg Intravenous BID  . metolazone  2.5 mg Oral Once  . [MAR Hold] potassium chloride  20 mEq Oral BID  . potassium chloride  40 mEq Oral Once  . [MAR Hold] sodium chloride flush  3 mL Intravenous Q12H  . [MAR Hold] sodium chloride flush  3 mL Intravenous Q12H  . [MAR Hold] spironolactone  12.5 mg Oral Daily    Infusions: . [MAR Hold] sodium chloride    . sodium chloride    . sodium chloride    . heparin 1,250 Units/hr (11/08/18 1949)    PRN Medications: [MAR Hold] sodium chloride, sodium chloride,  [MAR Hold] acetaminophen, [MAR Hold] ALPRAZolam, [MAR Hold] ondansetron (ZOFRAN) IV, [MAR Hold] sodium chloride flush, sodium chloride flush, [MAR Hold] zolpidem   Assessment/Plan   1. Acute on chronic diastolic CHF: Echo in 4/56 showed EF 60-65%, mild RV dilation with normal systolic function, PASP 93 mmHg.  She had remote nonischemic cardiomyopathy with recovery of EF, has [employer] ICD.  Elevated PA pressure by echo and suspect that there is significant RV dysfunction. Atrial fibrillation likely contributes, but this appears to be permanent now as she has failed multiple anti-arrhythmics and is reasonably rate-controlled. RHC today showed that she still has mild-moderately elevated PCWP and RA pressure, primarily pulmonary venous hypertension.  Prominent v-waves on PCWP tracing may be due to stiff ventricle/diastolic dysfunction as she had only mild MR on recent echo.  Still some volume overload on exam.  She continues to lose weight.  - Lasix 80 mg IV bid again today with a dose of metolazone 2.5 again.  Hopefully ready to transition to oral diuretic tomorrow.  - Continue spironolactone and replace K.  2. Pulmonary hypertension: PA systolic pressure estimated 93 mmHg on echo. Based on RHC today, she has primarily pulmonary venous hypertension.  3. CKD: Stage 3.  Creatinine 1.44 today, mildly higher.  Probably to po diuretics tomorrow.  4. Anemia: Fe deficiency.  Hgb 7.8 at admit, 8.6 today after 1 unit PRBCs on 7/2.  She denies overt bleeding.  She is Fe deficient. FOBT was negative.  - Fe being replaced IV.   - Restart heparin/warfarin.  5. Atrial fibrillation: Permanent now.  Rate control is reasonable.  She has failed amiodarone, Tikosyn, and flecainide as well as multiple cardioversions.  - Restart warfarin today.  She is on heparin gtt but do not think she has to be bridged when she goes home.   Hopefully ready for discharge tomorrow.   Length of Stay: [place], MD  11/09/2018,  8:18 AM  Advanced Heart Failure Team Pager [phone] (M-F; 7a - 4p)  Please contact [facility] for night-coverage after hours (4p -7a ) and weekends on [email or web address]

## 2018-11-09 NOTE — Progress Notes (Signed)
[facility] Progress Note  Patient: [name] JME:[id number]   PCP: Elby Beck, FNP DOB: 07-Dec-1933   DOA: 11/04/2018   DOS: 11/09/2018   Date of Service: the patient was seen and examined on 11/09/2018  Brief hospital course: Pt. with PMH of chronic combined CHF, AICD in situ, A. fib, CKD 3, CAD, TIA; admitted on 11/04/2018, presented with complaint of fatigue and tiredness as well as the leg swelling, was found to have acute on chronic decompensated heart failure secondary to symptomatic anemia. Currently further plan is continue supporting Hb and further work-up as well as treatment for heart failure.  Subjective: Seen after cath.  Felt dizzy and lightheaded.  No nausea no vomiting.  No chest pain abdominal pain.  Assessment and Plan: 1.  Acute on chronic diastolic congestive heart failure New York Heart Association class IV complicated by postoperative anemia leading to high-output heart failure. Potentially group 2 pulmonary hypertension with a component of Group 1 or group 3  Echo 6/20 EF 60 to 65% but with severe pulmonary hypertension. Patient states that she drinks 4 bottles of water daily (22 ounces?) She dislikes having to go to the bathroom all the time because of the torsemide Cardiology consulted. Appreciate assistance. Currently on IV Lasix 80 mg twice daily as well as metolazone 2.5 mg daily. Also on Aldactone 12.5 mg daily. Right heart cath completed by cardiology.  Likely transitioning to oral Lasix and recommended discharge home tomorrow.  2.  Multifactorial anemia- Post operative blood loss anemia, anemia chronic kidney disease as well as potential slow GI loss. Also iron deficiency. Iron level 16. Baseline hemoglobin 9-10 On presentation hemoglobin 7.4. After PRBC currently 8.4. MCV 68 and RDW 25+. Negative FOBT, Discussed with cardiology and I recommended to start the patient on heparin.  Resuming warfarin on 11/09/2018.  No bleeding after starting  her on heparin.  3.  Permanent atrial fibrillation: Patient on long-term warfarin currently rate controlled with current medication regimen.   Pacemaker AICD is in place.   Resume warfarin, currently on therapeutic heparin.  No bleeding after starting her on heparin.  4.  COPD:  Acute hypoxia Currently stable and compensated.  Continue current medication management.  No evidence of wheezing on examination. Requires oxygen on exertion.  5.  Chronic bilateral pleural effusions:  Likely related to heart failure.  Lasix dosing has been increased markedly and hopefully patient will diurese well and can wear those pleural effusions.  Will follow up with a chest x-ray in the next couple of days.  Currently there is no repeat chest x-ray ordered.  6.  Peripheral arterial disease:  Noted on lower extremities.  7.  History of renal cell carcinoma now with single kidney status post nephrectomy in the early 2000's.  8.  Hypokalemia. Chronic kidney disease stage III. Creatinine clearance 37. Around baseline. Avoid nephrotoxic medications.  Diet: Cardiac diet DVT Prophylaxis: SCD.  Advance goals of care discussion: full code  Family Communication: no family was present at bedside, at the time of interview.  Discussed with the family on 11/07/2018  Disposition:  Discharge to Home .  Consultants: cardiology  Procedures: none  Scheduled Meds: . B-complex with vitamin C  1 tablet Oral Daily  . feeding supplement (ENSURE ENLIVE)  237 mL Oral BID BM  . ferrous sulfate  325 mg Oral BID WC  . furosemide  80 mg Intravenous BID  . potassium chloride  20 mEq Oral BID  . sodium chloride flush  3 mL  Intravenous Q12H  . sodium chloride flush  3 mL Intravenous Q12H  . spironolactone  12.5 mg Oral Daily  . Warfarin - Pharmacist Dosing Inpatient   Does not apply q1800   Continuous Infusions: . sodium chloride    . heparin 1,350 Units/hr (11/09/18 1502)   PRN Meds: sodium chloride,  acetaminophen, ALPRAZolam, ondansetron (ZOFRAN) IV, sodium chloride flush, zolpidem Antibiotics: Anti-infectives (From admission, onward)   None       Objective: Physical Exam: Vitals:   11/09/18 0944 11/09/18 1000 11/09/18 1009 11/09/18 1131  BP: (!) 107/44 (!) 80/54 (!) 100/37 (!) 118/31  Pulse: 82 61 65 74  Resp: 14   20  Temp:    97.8 F (36.6 C)  TempSrc:    Oral  SpO2: 100%  99% 96%  Weight:      Height:        Intake/Output Summary (Last 24 hours) at 11/09/2018 1827 Last data filed at 11/09/2018 1237 Gross per 24 hour  Intake 880.02 ml  Output 1650 ml  Net -769.98 ml   Filed Weights   11/07/18 0603 11/08/18 0500 11/09/18 0606  Weight: 74.3 kg 72.8 kg 71.6 kg   General: alert and oriented to time, place, and person. Appear in moderate distress, affect appropriate Eyes: PERRL, Conjunctiva normal ENT: Oral Mucosa Clear, moist  Neck: difficult to assess  JVD, no Abnormal Mass Or lumps Cardiovascular: S1 and S2 Present, aortic systolic  Murmur, peripheral pulses symmetrical Respiratory: normal respiratory effort, Bilateral Air entry equal and Decreased, no use of accessory muscle, bilateral basal Crackles, no wheezes Abdomen: Bowel Sound present, Soft and no tenderness, no hernia Skin: no rashes  Extremities: bilateral  Pedal edema, no calf tenderness Neurologic: normal without focal findings, mental status, speech normal, alert and oriented x3, PERLA, Motor strength 5/5 and symmetric and sensation grossly normal to light touch Gait not checked due to patient safety concerns  Data Reviewed: CBC: Recent Labs  Lab 11/03/18 1353 11/04/18 1322  11/06/18 1003 11/07/18 0443 11/08/18 0447 11/09/18 0435 11/09/18 0803 11/09/18 0805 11/09/18 0806 11/09/18 1054  WBC 3.9* 5.4  --  4.4 4.7 4.5 4.3  --   --   --  3.2*  NEUTROABS 2.3 4.3  --   --   --   --   --   --   --   --  2.0  HGB 7.4 Repeated and verified X2.* 7.8*   < > 8.6* 8.5* 8.5* 8.6* 10.9* 10.2* 10.2* 8.4*   HCT 24.0* 28.5*   < > 29.9* 29.4* 29.5* 29.8* 32.0* 30.0* 30.0* 29.7*  MCV 68.2* 74.4*  --  74.9* 74.2* 75.6* 74.7*  --   --   --  75.8*  PLT 291.0 298  --  266 265 259 256  --   --   --  249   < > = values in this interval not displayed.   Basic Metabolic Panel: Recent Labs  Lab 11/05/18 0406 11/06/18 0402 11/06/18 1003 11/06/18 1401 11/07/18 0443 11/08/18 0447 11/09/18 0435 11/09/18 0803 11/09/18 0805 11/09/18 0806  NA 137 134* 136 136 135 136 136 137 138 136  K 3.7 5.4* 3.6 4.0 3.4* 3.4* 3.4* 3.2* 3.2* 3.3*  CL 96* 93* 94* 92* 91* 91* 92*  --   --   --   CO2 31 29 29  34* 31 34* 33*  --   --   --   GLUCOSE 99 104* 114* 139* 103* 101* 106*  --   --   --  BUN 19 19 17 18 18 22  26*  --   --   --   CREATININE 1.38* 1.23* 1.20* 1.29* 1.22* 1.30* 1.44*  --   --   --   CALCIUM 8.7* 8.6* 9.0 9.2 8.8* 9.0 8.9  --   --   --   MG 2.5* 2.2  --   --  2.0 1.9 2.0  --   --   --     Liver Function Tests: Recent Labs  Lab 11/04/18 1715  AST 20  ALT 10  ALKPHOS 90  BILITOT 1.3*  PROT 5.9*  ALBUMIN 3.0*   No results for input(s): LIPASE, AMYLASE in the last 168 hours. No results for input(s): AMMONIA in the last 168 hours. Coagulation Profile: Recent Labs  Lab 11/05/18 0406 11/06/18 1003 11/07/18 0443 11/08/18 0447 11/09/18 0825  INR 2.4* 1.5* 1.6* 1.4* 1.2   Cardiac Enzymes: No results for input(s): CKTOTAL, CKMB, CKMBINDEX, TROPONINI in the last 168 hours. BNP (last 3 results) No results for input(s): PROBNP in the last 8760 hours. CBG: No results for input(s): GLUCAP in the last 168 hours. Studies: No results found.   Time spent: 35 minutes  Author: Berle Mull, MD [facility] 11/09/2018 6:27 PM  To reach On-call, see care teams to locate the attending and reach out to them via [email or web address]. If 7PM-7AM, please contact night-coverage If you still have difficulty reaching the attending provider, please page the [facility] (Director on Call) for [facility] on amion for assistance.

## 2018-11-09 NOTE — Plan of Care (Signed)
  Problem: Clinical Measurements: Goal: Ability to maintain clinical measurements within normal limits will improve Outcome: Progressing   

## 2018-11-10 LAB — CBC
HCT: 30.7 % — ABNORMAL LOW (ref 36.0–46.0)
Hemoglobin: 8.7 g/dL — ABNORMAL LOW (ref 12.0–15.0)
MCH: 21.6 pg — ABNORMAL LOW (ref 26.0–34.0)
MCHC: 28.3 g/dL — ABNORMAL LOW (ref 30.0–36.0)
MCV: 76.2 fL — ABNORMAL LOW (ref 80.0–100.0)
Platelets: 256 10*3/uL (ref 150–400)
RBC: 4.03 MIL/uL (ref 3.87–5.11)
RDW: 26.6 % — ABNORMAL HIGH (ref 11.5–15.5)
WBC: 4.4 10*3/uL (ref 4.0–10.5)
nRBC: 0 % (ref 0.0–0.2)

## 2018-11-10 LAB — BASIC METABOLIC PANEL
Anion gap: 13 (ref 5–15)
BUN: 27 mg/dL — ABNORMAL HIGH (ref 8–23)
CO2: 32 mmol/L (ref 22–32)
Calcium: 9.6 mg/dL (ref 8.9–10.3)
Chloride: 91 mmol/L — ABNORMAL LOW (ref 98–111)
Creatinine, Ser: 1.33 mg/dL — ABNORMAL HIGH (ref 0.44–1.00)
GFR calc Af Amer: 43 mL/min — ABNORMAL LOW (ref >60)
GFR calc non Af Amer: 37 mL/min — ABNORMAL LOW (ref >60)
Glucose, Bld: 106 mg/dL — ABNORMAL HIGH (ref 70–99)
Potassium: 3.7 mmol/L (ref 3.5–5.1)
Sodium: 136 mmol/L (ref 135–145)

## 2018-11-10 LAB — PROTIME-INR
INR: 1.3 — ABNORMAL HIGH (ref 0.8–1.2)
Prothrombin Time: 15.5 seconds — ABNORMAL HIGH (ref 11.4–15.2)

## 2018-11-10 LAB — MAGNESIUM: Magnesium: 2.2 mg/dL (ref 1.7–2.4)

## 2018-11-10 LAB — HEPARIN LEVEL (UNFRACTIONATED): Heparin Unfractionated: 0.53 IU/mL (ref 0.30–0.70)

## 2018-11-10 MED ORDER — TORSEMIDE 20 MG PO TABS: 80.0000 mg | ORAL_TABLET | Freq: Every day | ORAL | Status: DC

## 2018-11-10 MED ORDER — ENSURE ENLIVE PO LIQD: 237.0000 mL | Freq: Two times a day (BID) | ORAL | 12 refills | Status: DC

## 2018-11-10 MED ORDER — SPIRONOLACTONE 25 MG PO TABS: 12.5000 mg | ORAL_TABLET | Freq: Every day | ORAL | 0 refills | Status: DC

## 2018-11-10 MED ORDER — TORSEMIDE 20 MG PO TABS: 80.0000 mg | ORAL_TABLET | Freq: Every day | ORAL | 0 refills | Status: DC

## 2018-11-10 MED ORDER — TORSEMIDE 20 MG PO TABS: 40.0000 mg | ORAL_TABLET | Freq: Two times a day (BID) | ORAL | Status: DC

## 2018-11-10 NOTE — Plan of Care (Signed)
  Problem: Clinical Measurements: Goal: Will remain free from infection Outcome: Progressing Goal: Respiratory complications will improve Outcome: Progressing   Problem: Activity: Goal: Risk for activity intolerance will decrease Outcome: Progressing   Problem: Elimination: Goal: Will not experience complications related to bowel motility Outcome: Progressing   Problem: Safety: Goal: Ability to remain free from injury will improve Outcome: Progressing

## 2018-11-10 NOTE — TOC Transition Note (Addendum)
Transition of Care [facility]) - CM/SW Discharge Note   Patient Details  Name: [name] MRN: [id number] Date of Birth: November 11, 1933  Transition of Care [facility]) CM/SW Contact:  Sharin Mons, RN Phone Number: 11/10/2018, 12:12 PM   Clinical Narrative:   Admitted with Acute on chronic HF. Pt with recent admit 6/2-6/8, underwent left total hip arthroplasty on 10/09/2018, discharged to SNF.  Pt will transition to home today. No oxygen needs noted. [facility] [place]  agency to provide to provide home health services ( PT, pt declined RN,OT ).   Pt states has good understating of heart failure and will f/u with heart MD's ( Dr. [name] and Dr. Caryl Comes). Pt states has scale to weigh daily.  Pt states has transportation to home.  Pt's home # [phone] Pt's cell # [phone]  PCP: Elby Beck, FNP Phone: [phone]      Final next level of care: [place] Barriers to Discharge: No Barriers Identified   Patient Goals and CMS Choice Patient states their goals for this hospitalization and ongoing recovery are:: to go home and get better CMS Medicare.gov Compare Post Acute Care list provided to:: Patient Choice offered to / list presented to : Patient  Discharge Placement                       Discharge Plan and Services                DME Arranged: N/A DME Agency: NA       HH Arranged: PT, OT, RN , pt declined RN,PT services. Requesting PT only. [place] Agency: [facility] Date [place] Agency Contacted: 11/10/18 Time [place]: 1207 Representative spoke with at [place]: [place] ([place]) Interventions     Readmission Risk Interventions No flowsheet data found.

## 2018-11-10 NOTE — Progress Notes (Signed)
Patient ID: [name], female   DOB: 1933-09-10, 85 y.o.   MRN: [id number]     Advanced Heart Failure Rounding Note  PCP-Cardiologist: Virl Axe, MD   Subjective:    Breathing has been getting better, weight now down 22 lbs.   She got 1 unit PRBCs on 7/2, no overt bleeding.  FOBT negative.  Hgb stable 8.7.  Heparin/warfarin has been restarted, INR 1.3.    Pending BMET.   [facility] Procedural Findings (7/6): Hemodynamics (mmHg) RA mean 11 RV 61/12 PA 64/18, mean 39 PCWP mean 24 with v-wave to 41 Oxygen saturations: PA 71% AO 98% Cardiac Output (Fick) 7.76  Cardiac Index (Fick) 4.21 PVR 1.9 WU  Objective:   Weight Range: 69.9 kg Body mass index is 24.52 kg/m.   Vital Signs:   Temp:  [97.4 F (36.3 C)-98.5 F (36.9 C)] 98.5 F (36.9 C) (07/07 0314) Pulse Rate:  [61-82] 64 (07/07 0834) Resp:  [14-20] 18 (07/07 0314) BP: (80-133)/(31-69) 125/53 (07/07 0834) SpO2:  [96 %-100 %] 97 % (07/07 0314) Weight:  [69.9 kg] 69.9 kg (07/07 0314) Last BM Date: 11/09/18  Weight change: Filed Weights   11/08/18 0500 11/09/18 0606 11/10/18 0314  Weight: 72.8 kg 71.6 kg 69.9 kg    Intake/Output:   Intake/Output Summary (Last 24 hours) at 11/10/2018 0859 Last data filed at 11/10/2018 0500 Gross per 24 hour  Intake 1026.18 ml  Output 2350 ml  Net -1323.82 ml      Physical Exam    General: NAD Neck: JVP 12 cm, no thyromegaly or thyroid nodule.  Lungs: Clear to auscultation bilaterally with normal respiratory effort. CV: Nondisplaced PMI.  Heart irregular S1/S2, no S3/S4, no murmur.  1+ ankle edema.   Abdomen: Soft, nontender, no hepatosplenomegaly, no distention.  Skin: Intact without lesions or rashes.  Neurologic: Alert and oriented x 3.  Psych: Normal affect. Extremities: No clubbing or cyanosis.  HEENT: Normal.    Telemetry   Atrial fibrillation in 70s (personally reviewed)  Labs    CBC Recent Labs    11/09/18 1054 11/10/18 0443  WBC 3.2* 4.4   NEUTROABS 2.0  --   HGB 8.4* 8.7*  HCT 29.7* 30.7*  MCV 75.8* 76.2*  PLT 249 154   Basic Metabolic Panel Recent Labs    11/08/18 0447 11/09/18 0435  11/09/18 0805 11/09/18 0806 11/10/18 0443  NA 136 136   < > 138 136  --   K 3.4* 3.4*   < > 3.2* 3.3*  --   CL 91* 92*  --   --   --   --   CO2 34* 33*  --   --   --   --   GLUCOSE 101* 106*  --   --   --   --   BUN 22 26*  --   --   --   --   CREATININE 1.30* 1.44*  --   --   --   --   CALCIUM 9.0 8.9  --   --   --   --   MG 1.9 2.0  --   --   --  2.2   < > = values in this interval not displayed.   Liver Function Tests No results for input(s): AST, ALT, ALKPHOS, BILITOT, PROT, ALBUMIN in the last 72 hours. No results for input(s): LIPASE, AMYLASE in the last 72 hours. Cardiac Enzymes No results for input(s): CKTOTAL, CKMB, CKMBINDEX, TROPONINI in the last 72 hours.  BNP: BNP (last 3 results) Recent Labs    10/06/18 2220 11/04/18 1715  BNP 235.7* 369.3*    ProBNP (last 3 results) No results for input(s): PROBNP in the last 8760 hours.   D-Dimer No results for input(s): DDIMER in the last 72 hours. Hemoglobin A1C No results for input(s): HGBA1C in the last 72 hours. Fasting Lipid Panel No results for input(s): CHOL, HDL, LDLCALC, TRIG, CHOLHDL, LDLDIRECT in the last 72 hours. Thyroid Function Tests No results for input(s): TSH, T4TOTAL, T3FREE, THYROIDAB in the last 72 hours.  Invalid input(s): FREET3  Other results:   Imaging    No results found.   Medications:     Scheduled Medications: . B-complex with vitamin C  1 tablet Oral Daily  . feeding supplement (ENSURE ENLIVE)  237 mL Oral BID BM  . ferrous sulfate  325 mg Oral BID WC  . potassium chloride  20 mEq Oral BID  . sodium chloride flush  3 mL Intravenous Q12H  . sodium chloride flush  3 mL Intravenous Q12H  . spironolactone  12.5 mg Oral Daily  . torsemide  40 mg Oral BID  . Warfarin - Pharmacist Dosing Inpatient   Does not apply q1800     Infusions: . sodium chloride    . heparin 1,350 Units/hr (11/09/18 1502)    PRN Medications: sodium chloride, acetaminophen, ALPRAZolam, ondansetron (ZOFRAN) IV, sodium chloride flush, zolpidem   Assessment/Plan   1. Acute on chronic diastolic CHF: Echo in 9/56 showed EF 60-65%, mild RV dilation with normal systolic function, PASP 93 mmHg.  She had remote nonischemic cardiomyopathy with recovery of EF, has [employer] ICD.  Elevated PA pressure by echo and suspect that there is significant RV dysfunction. Atrial fibrillation likely contributes, but this appears to be permanent now as she has failed multiple anti-arrhythmics and is reasonably rate-controlled. [place] 7/6 showed that she still has mild-moderately elevated PCWP and RA pressure, primarily pulmonary venous hypertension.  Prominent v-waves on PCWP tracing may be due to stiff ventricle/diastolic dysfunction as she had only mild MR on recent echo.  She diuresed well again yesterday, looks near euvolemic.  Weight down 22 lbs total.  No BMET yet today.  - Got 1 dose IV Lasix this morning, stop Lasix now. Will start torsemide 80 mg daily tomorrow.  - Continue spironolactone.  - Needs BMET now.  2. Pulmonary hypertension: PA systolic pressure estimated 93 mmHg on echo. Based on [place] 7/6, she has primarily pulmonary venous hypertension.  3. CKD: Stage 3.  Pending BMET today.  Stopping IV Lasix today.  4. Anemia: Fe deficiency.  Hgb 7.8 at admit, 8.7 today after 1 unit PRBCs on 7/2.  She denies overt bleeding.  She is Fe deficient. FOBT was negative.  - Fe replaced IV.   - Restarted heparin/warfarin.  5. Atrial fibrillation: Permanent now.  Rate control is reasonable.  She has failed amiodarone, Tikosyn, and flecainide as well as multiple cardioversions.  - Continue warfarin/heparin overlap while inpatient, do not think she has to be bridged with Lovenox when she goes home.  6. Disposition: From my standpoint, she could go home today.  She  will need to have INR followup at PCP's office on Thursday or Friday. She will need CHF clinic appt 10-14 days.  Cardiac meds for home: Torsemide 80 mg daily, KCl 20 bid, spironolactone 12.5 daily, warfarin.   Length of Stay: 6  Loralie Champagne, MD  11/10/2018, 8:59 AM  Advanced Heart Failure Team Pager [phone] (M-F; 7a -  4p)  Please contact [facility] for night-coverage after hours (4p -7a ) and weekends on [email or web address]

## 2018-11-10 NOTE — Progress Notes (Signed)
SATURATION QUALIFICATIONS: (This note is used to comply with regulatory documentation for home oxygen)  Patient Saturations on Room Air at Rest = 97%  Patient Saturations on Room Air while Ambulating = 92%  Patient Saturations on 0Liters of oxygen while Ambulating = 0%  Please briefly explain why patient needs home oxygen:

## 2018-11-10 NOTE — Consult Note (Signed)
   [facility] CM Inpatient Consult   11/10/2018  [name] [birth year] [id number]    Patient reviewed for 29% high risk score for unplanned readmissions; has 30 day readmission with 3 hospitalizations and 1 ED visit in the past 6 months. She has [employer] plan.   Chart review and MD note on 11/09/18 show as follows: Patient with PMH of chronic combined CHF, AICD in situ, A. fib, CKD 3, CAD, TIA;  admitted on 11/04/2018, presented with complaint of fatigue and tiredness as well as the leg swelling, was found to have acute on chronic decompensated heart failure secondary to symptomatic anemia. Currently further plan is continue supporting Hb and further work-up as well as treatment for heart failure. Patient with recent admit on 6/2-6/8, underwent left total hip arthroplasty on 10/09/2018 and discharged to SNF (skilled nursing facility).    Per transition of care CM note review, patient will transition home today. No oxygen needs noted. [place] agency to provide home health services ( PT, pt-declined RN,OT ).    Primary Care provider is Elby Beck, FNP with [employer] at [facility], listed to provide transition of care follow-up. Patient will also follow-up with the Heart Failure clinic post discharge.  Patient will be followed by an external care management group([facility]) post hospitalization to follow-up needs and for continued case management services with [employer].   Of note, [facility] Care Management services does not replace or interfere with any services that are needed or arranged by inpatienttransition of carecase management or social work.  Will sign off.   For additional questions or referrals,please contact:  Rosette Bellavance A. Geovanie Winnett, BSN, RN-BC [facility] Liaison Cell: [phone]

## 2018-11-11 ENCOUNTER — Telehealth: Payer: Self-pay | Admitting: *Deleted

## 2018-11-11 ENCOUNTER — Telehealth (HOSPITAL_COMMUNITY): Payer: Self-pay

## 2018-11-11 MED ORDER — ROSUVASTATIN CALCIUM 40 MG PO TABS: 40.0000 mg | ORAL_TABLET | Freq: Every day | ORAL | 3 refills | Status: DC

## 2018-11-11 NOTE — Telephone Encounter (Signed)
Prescription sent to pharmacy for crestor 40mg  daily.

## 2018-11-11 NOTE — Progress Notes (Signed)
No ICM remote transmission received for 11/09/2018 due to hospitalization and next ICM transmission scheduled for 11/30/2018.

## 2018-11-11 NOTE — Telephone Encounter (Signed)
Called pt to verify hosp f/u appt that's been made for 11/20/18 w/ [name], NP. Pt states the hospital made the appt before she left and she is aware. Inform pt had some additional question concerning discharge completed TCM call below.Johny Chess  Transition Care Management Follow-up Telephone Call   Date discharged? 11/10/18   How have you been since you were released from the hospital? Pt states she is feeling fine   Do you understand why you were in the hospital? YES   Do you understand the discharge instructions? YES   Where were you discharged to? Home   Items Reviewed:  Medications reviewed: YES, pt states no changes  Allergies reviewed: YES  Dietary changes reviewed: YES, heart healthy  Referrals reviewed: YES, she states no one has called yet for HH/PT   Functional Questionnaire:   Activities of Daily Living (ADLs):   She states she are independent in the following: ambulation, bathing and hygiene, feeding, continence, grooming, toileting and dressing States she doesn't require assistance    Any transportation issues/concerns?: NO, pt states her daughter usually brings her to her appt   Any patient concerns? NO   Confirmed importance and date/time of follow-up visits scheduled YES, appt 11/20/18  Provider Appointment booked with NP, Percival Spanish  Confirmed with patient if condition begins to worsen call PCP or go to the ER.  Patient was given the office number and encouraged to call back with question or concerns.  : YES

## 2018-11-11 NOTE — Telephone Encounter (Signed)
Continue to take Crestor 40 mg daily.

## 2018-11-11 NOTE — Telephone Encounter (Signed)
Pts daughter called and left voicemail stating that the doctor recommended her mother continuing crestor however was not called into pharmacy before discharge from hospital yesterday. Routed to dr for confirmation   Dr [name], Please confirm dose of crestor and if ok to refill?

## 2018-11-12 ENCOUNTER — Telehealth: Payer: Self-pay

## 2018-11-12 DIAGNOSIS — Z8673 Personal history of transient ischemic attack (TIA), and cerebral infarction without residual deficits: Secondary | ICD-10-CM | POA: Diagnosis not present

## 2018-11-12 DIAGNOSIS — D631 Anemia in chronic kidney disease: Secondary | ICD-10-CM | POA: Diagnosis not present

## 2018-11-12 DIAGNOSIS — I5032 Chronic diastolic (congestive) heart failure: Secondary | ICD-10-CM | POA: Diagnosis not present

## 2018-11-12 DIAGNOSIS — I10 Essential (primary) hypertension: Secondary | ICD-10-CM | POA: Diagnosis not present

## 2018-11-12 DIAGNOSIS — Z85528 Personal history of other malignant neoplasm of kidney: Secondary | ICD-10-CM | POA: Diagnosis not present

## 2018-11-12 DIAGNOSIS — I4891 Unspecified atrial fibrillation: Secondary | ICD-10-CM | POA: Diagnosis not present

## 2018-11-12 DIAGNOSIS — Z79891 Long term (current) use of opiate analgesic: Secondary | ICD-10-CM | POA: Diagnosis not present

## 2018-11-12 DIAGNOSIS — I2510 Atherosclerotic heart disease of native coronary artery without angina pectoris: Secondary | ICD-10-CM | POA: Diagnosis not present

## 2018-11-12 DIAGNOSIS — M1990 Unspecified osteoarthritis, unspecified site: Secondary | ICD-10-CM | POA: Diagnosis not present

## 2018-11-12 DIAGNOSIS — Z96643 Presence of artificial hip joint, bilateral: Secondary | ICD-10-CM | POA: Diagnosis not present

## 2018-11-12 DIAGNOSIS — Z950 Presence of cardiac pacemaker: Secondary | ICD-10-CM | POA: Diagnosis not present

## 2018-11-12 DIAGNOSIS — N189 Chronic kidney disease, unspecified: Secondary | ICD-10-CM | POA: Diagnosis not present

## 2018-11-12 DIAGNOSIS — Z7901 Long term (current) use of anticoagulants: Secondary | ICD-10-CM | POA: Diagnosis not present

## 2018-11-12 DIAGNOSIS — Z9181 History of falling: Secondary | ICD-10-CM | POA: Diagnosis not present

## 2018-11-12 DIAGNOSIS — Z90710 Acquired absence of both cervix and uterus: Secondary | ICD-10-CM | POA: Diagnosis not present

## 2018-11-12 DIAGNOSIS — I130 Hypertensive heart and chronic kidney disease with heart failure and stage 1 through stage 4 chronic kidney disease, or unspecified chronic kidney disease: Secondary | ICD-10-CM | POA: Diagnosis not present

## 2018-11-12 DIAGNOSIS — I959 Hypotension, unspecified: Secondary | ICD-10-CM | POA: Diagnosis not present

## 2018-11-12 DIAGNOSIS — S72132D Displaced apophyseal fracture of left femur, subsequent encounter for closed fracture with routine healing: Secondary | ICD-10-CM | POA: Diagnosis not present

## 2018-11-12 NOTE — Discharge Summary (Signed)
[facility] Discharge Summary   Patient: [name] KTG:[id number]   PCP: Elby Beck, FNP DOB: March 05, 1934   Date of admission: 11/04/2018   Date of discharge: 11/10/2018     Discharge Diagnoses:   Principal Problem:   Acute on chronic diastolic CHF (congestive heart failure), NYHA class 4 ([place]) Active Problems:   Hyperlipidemia   Permanent atrial fibrillation   COPD (chronic obstructive pulmonary disease) ([place])   ICD (implantable cardioverter-defibrillator) in place   PAD (peripheral artery disease) ([place])   S/p nephrectomy   Pulmonary hypertension ([place])   Long term (current) use of anticoagulants   Shortness of breath   Postoperative anemia due to acute blood loss   Chronic bilateral pleural effusions   Admitted From: home Disposition:  Home home health  Recommendations for Outpatient Follow-up:  1. Please follow up with PCP and cardiology as recommended    Follow-up Information    Larey Dresser, MD. Go on 11/18/2018.   Specialty: Cardiology Why: 3:20 pm, [facility], Entrance C, parking code [place] information: [place] [address] [place], [place], [place]. Go on 11/20/2018.   Specialties: Nurse Practitioner, Family Medicine Why: repeat CBC and INR on thursday @3 :00pm Contact information: [place] [address] [phone]        Health, [facility] Follow up.   Specialty: Home Health Services Contact information: [address] [place] [address] [phone]          Diet recommendation: cardiac diet  Activity: The patient is advised to gradually reintroduce usual activities,as tolerated .  Discharge Condition: good  Code Status: full code  History of present illness: As per the H and P dictated on admission, "[name] is a 85 y.o. female with medical history significant of chronic diastolic heart failure with AICD in place,  atrial fibrillation on warfarin, chronic kidney disease stage III, coronary artery disease, TIA, chronic anemia with a baseline hemoglobin of 11 but recent hemoglobin much lower than that, renal cell carcinoma treated in the early 2000's with nephrectomy now with a single kidney, and recent left hip fracture status post ORIF in early June 2020 who presents today at the behest of Clarene Reamer, FNP due to anemia.  Patient has been anemic since her left hip surgery in early June.  She has received a blood transfusion.  She was placed on oral iron while at the skilled nursing facility and then after discharge chewable iron tablets were ordered for her by Ms. Berta Minor however her hemoglobin has remained very low.  The patient reports feeling dizzy and short of breath.  Her daughter reports she is only able to walk about 15 feet without giving out.  She has to sit and rest.  She states that she has to stop to rest on her way to the bathroom and on her way to the kitchen when trying to just prepare toast.  Daughter states that after simply going to the bathroom and preparing toast the patient is just simply exhausted.  She has been quite fatigued and having a hard time doing her rehabilitation activities.  Her rehab had to get delayed.  She has a great deal of swelling in her legs and reports that she has pain in the leg and has what appears to be chronic venous stasis as well.  Denies any fever, cough, nausea, vomiting, dysuria, urinary frequency  except that caused by her diuretic, headache, blurry vision, unilateral weakness or numbness."  Hospital Course:  Summary of her active problems in the hospital is as following. 1. Acute on chronic diastolic congestive heart failure New York Heart Association class IV complicated by postoperative anemia leading to high-output heart failure. Potentially group 2 pulmonary hypertension with a component of Group 1 or group 3  Echo 6/20 EF 60 to 65% but with severe  pulmonary hypertension. Patient states that she drinks 4 bottles of water daily (22 ounces?) She dislikes having to go to the bathroom all the time because of the torsemide Cardiology consulted. Appreciate assistance. Treated with IV Lasix 80 mg twice daily as well as metolazone 2.5 mg daily. Also on Aldactone 12.5 mg daily. Right heart cath completed by cardiology. Transitioning to oral Lasix and recommended.  2.  Multifactorial anemia- Post operative blood loss anemia, anemia chronic kidney disease as well as potential slow GI loss. Chronic Iron deficiency. Iron level 16. Baseline hemoglobin 9-10 On presentation hemoglobin 7.4. After PRBC currently 8.4. MCV 68 and RDW 25+. Negative FOBT, Discussed with cardiology started the patient on heparin. nu bleeding seen in hospital.  Resuming warfarin on 11/09/2018.   No bleeding after starting her on heparin.  3. Permanent atrial fibrillation: Patient on long-term warfarin currently rate controlled with current medication regimen.  Pacemaker AICD is in place.  Resume warfarin, currently on therapeutic heparin.  No bleeding after starting her on heparin.  4. COPD:  Acute hypoxia Currently stable and compensated. Continue current medication management. No evidence of wheezing on examination. Requires oxygen on exertion.  5. Chronic bilateral pleural effusions:  Likely related to heart failure. Lasix dosing has been increased markedly and hopefully patient will diurese well and can wear those pleural effusions. Will follow up with a chest x-ray in the next couple of days. Currently there is no repeat chest x-ray ordered.  6. Peripheral arterial disease:  Noted on lower extremities.  7. History of renal cell carcinoma now with single kidney status post nephrectomy in the early 2000's.  8.  Hypokalemia. Chronic kidney disease stage III. Creatinine clearance 37. Around baseline. Avoid nephrotoxic  medications.  Patient was seen by physical therapy, who recommended Home health, which was arranged by case manager. On the day of the discharge the patient's vitals were stable, and no other acute medical condition were reported by patient. the patient was felt safe to be discharge at Home with Home health.  Consultants: cardiology  Procedures: CHF  DISCHARGE MEDICATION: Allergies as of 11/10/2018      Reactions   Dilaudid [hydromorphone] Other (See Comments)   Excessive Somnolence  Needing Narcan.   Codeine Nausea And Vomiting   Morphine And Related Nausea And Vomiting   Sulfonamide Derivatives Other (See Comments)   Dry mouth      Medication List    STOP taking these medications   metolazone 5 MG tablet Commonly known as: ZAROXOLYN     TAKE these medications   acetaminophen 325 MG tablet Commonly known as: TYLENOL Take 2 tablets (650 mg total) by mouth every 6 (six) hours as needed for mild pain or moderate pain (or Fever >/= 101).   bisacodyl 5 MG EC tablet Commonly known as: DULCOLAX Take 1 tablet (5 mg total) by mouth daily as needed for moderate constipation.   cetirizine 10 MG tablet Commonly known as: ZYRTEC Take 10 mg by mouth daily.   conjugated estrogens vaginal cream Commonly known as: Premarin Place 1 Applicatorful  vaginally daily. What changed:   when to take this  reasons to take this   CRANBERRY PO Take 4 tablets by mouth daily.   docusate sodium 100 MG capsule Commonly known as: COLACE Take 1 capsule (100 mg total) by mouth 2 (two) times daily. What changed:   when to take this  reasons to take this   ezetimibe 10 MG tablet Commonly known as: ZETIA Take 1 tablet (10 mg total) by mouth daily.   feeding supplement (ENSURE ENLIVE) Liqd Take 237 mLs by mouth 2 (two) times daily between meals.   ferrous sulfate 325 (65 FE) MG tablet Take 325 mg by mouth daily with breakfast. Notes to patient: 7/8   fluticasone 50 MCG/ACT nasal  spray Commonly known as: FLONASE Place 2 sprays into both nostrils daily. What changed:   when to take this  reasons to take this   hydrocortisone 2.5 % cream Apply 1 application topically daily as needed (itching).   levothyroxine 25 MCG tablet Commonly known as: SYNTHROID Take 1 tablet (25 mcg total) by mouth daily before breakfast.   magnesium oxide 400 MG tablet Commonly known as: MAG-OX Take 1 tablet (400 mg total) by mouth daily.   potassium chloride SA 20 MEQ tablet Commonly known as: K-DUR Take 1 tablet (20 meq) by mouth twice daily, except on metolazone days take 2 tablets (40 meq) twice daily What changed:   how much to take  how to take this  when to take this   spironolactone 25 MG tablet Commonly known as: ALDACTONE Take 0.5 tablets (12.5 mg total) by mouth daily. Notes to patient: 7/8   torsemide 20 MG tablet Commonly known as: DEMADEX Take 4 tablets (80 mg total) by mouth daily. What changed:   how much to take  how to take this  when to take this  additional instructions Notes to patient: 7/8   vitamin C 100 MG tablet Take 100 mg by mouth daily.   warfarin 2 MG tablet Commonly known as: COUMADIN Take as directed. If you are unsure how to take this medication, talk to your nurse or doctor. Original instructions: Take 1 tablet (2 mg total) by mouth as directed. Take 2 mg on Monday- Saturday and 1 Mg on Sunday What changed:   how much to take  when to take this Notes to patient: 7/7      Allergies  Allergen Reactions   Dilaudid [Hydromorphone] Other (See Comments)    Excessive Somnolence  Needing Narcan.   Codeine Nausea And Vomiting   Morphine And Related Nausea And Vomiting   Sulfonamide Derivatives Other (See Comments)    Dry mouth   Discharge Instructions    Diet - low sodium heart healthy   Complete by: As directed    Discharge instructions   Complete by: As directed    It is important that you read the given  instructions as well as go over your medication list with RN to help you understand your care after this hospitalization.  Discharge Instructions: Please follow-up with PCP in 1-2 weeks  Please request your primary care physician to go over all Hospital Tests and Procedure/Radiological results at the follow up. Please get all Hospital records sent to your PCP by signing hospital release before you go home.   Do not take more than prescribed Pain, Sleep and Anxiety Medications. You were cared for by a hospitalist during your hospital stay. If you have any questions about your discharge medications or the care you  received while you were in the hospital after you are discharged, you can call the unit @UNIT @ you were admitted to and ask to speak with the hospitalist on call if the hospitalist that took care of you is not available.  Once you are discharged, your primary care physician will handle any further medical issues. Please note that NO REFILLS for any discharge medications will be authorized once you are discharged, as it is imperative that you return to your primary care physician (or establish a relationship with a primary care physician if you do not have one) for your aftercare needs so that they can reassess your need for medications and monitor your lab values. You Must read complete instructions/literature along with all the possible adverse reactions/side effects for all the Medicines you take and that have been prescribed to you. Take any new Medicines after you have completely understood and accept all the possible adverse reactions/side effects. Wear Seat belts while driving. If you have smoked or chewed Tobacco in the last 2 yrs please stop smoking and/or stop any Recreational drug use.   Increase activity slowly   Complete by: As directed      Discharge Exam: Filed Weights   11/08/18 0500 11/09/18 0606 11/10/18 0314  Weight: 72.8 kg 71.6 kg 69.9 kg   Vitals:   11/10/18 0314  11/10/18 0834  BP: 130/69 (!) 125/53  Pulse: 61 64  Resp: 18   Temp: 98.5 F (36.9 C)   SpO2: 97%    General: Appear in no distress, no Rash; Oral Mucosa Clear, moist. no Abnormal Mass Or lumps Cardiovascular: S1 and S2 Present, no Murmur, Respiratory: normal respiratory effort, Bilateral Air entry present and Clear to Auscultation, no Crackles, no wheezes Abdomen: Bowel Sound present, Soft and no tenderness, no hernia Extremities: bilateral Pedal edema, no calf tenderness Neurology: alert and oriented to time, place, and person affect appropriate. normal without focal findings, mental status, speech normal, alert and oriented x3, PERLA, Motor strength 5/5 and symmetric and sensation grossly normal to light touch   The results of significant diagnostics from this hospitalization (including imaging, microbiology, ancillary and laboratory) are listed below for reference.    Significant Diagnostic Studies: Dg Chest Port 1 View  Result Date: 11/04/2018 CLINICAL DATA:  Shortness of breath EXAM: PORTABLE CHEST 1 VIEW COMPARISON:  October 10, 2018 FINDINGS: There is a stable multi lead left-sided pacemaker. The heart size is stable but enlarged. There are persistent small bilateral pleural effusions with adjacent bibasilar airspace opacities favored to represent atelectasis. Aortic calcifications are noted. There is no pneumothorax. There is a partially visualized sclerotic lesion in the proximal left humerus. There are few calcified granulomas overlying the right lower lobe. IMPRESSION: 1. Persistent bilateral small pleural effusions. 2. Persistent bibasilar airspace opacities favored to represent atelectasis with infiltrate not entirely excluded. Electronically Signed   By: Constance Holster M.D.   On: 11/04/2018 15:32   Vas [place] Lower Extremity Venous (dvt) (only [facility])  Result Date: 11/05/2018  Lower Venous Study Indications: Swelling, and Edema.  Limitations: Body habitus and poor ultrasound/tissue  interface. Comparison Study: Prior study done 10/07/18-negative Performing Technologist: Abram Sander RVS  Examination Guidelines: A complete evaluation includes B-mode imaging, spectral Doppler, color Doppler, and power Doppler as needed of all accessible portions of each vessel. Bilateral testing is considered an integral part of a complete examination. Limited examinations for reoccurring indications may be performed as noted.  +---------+---------------+---------+-----------+----------+--------------+  RIGHT     Compressibility Phasicity Spontaneity  Properties Summary         +---------+---------------+---------+-----------+----------+--------------+  CFV       Full            Yes       Yes                                    +---------+---------------+---------+-----------+----------+--------------+  SFJ       Full                                                             +---------+---------------+---------+-----------+----------+--------------+  FV Prox   Full                                                             +---------+---------------+---------+-----------+----------+--------------+  FV Mid    Full                                                             +---------+---------------+---------+-----------+----------+--------------+  FV Distal Full                                                             +---------+---------------+---------+-----------+----------+--------------+  PFV       Full                                                             +---------+---------------+---------+-----------+----------+--------------+  POP       Full            Yes       Yes                                    +---------+---------------+---------+-----------+----------+--------------+  PTV       Full                                                             +---------+---------------+---------+-----------+----------+--------------+  PERO  Not  visualized  +---------+---------------+---------+-----------+----------+--------------+   +---------+---------------+---------+-----------+----------+--------------+  LEFT      Compressibility Phasicity Spontaneity Properties Summary         +---------+---------------+---------+-----------+----------+--------------+  CFV       Full            Yes       Yes                                    +---------+---------------+---------+-----------+----------+--------------+  SFJ       Full                                                             +---------+---------------+---------+-----------+----------+--------------+  FV Prox   Full                                                             +---------+---------------+---------+-----------+----------+--------------+  FV Mid                    Yes       Yes                                    +---------+---------------+---------+-----------+----------+--------------+  FV Distal                 Yes       Yes                                    +---------+---------------+---------+-----------+----------+--------------+  PFV       Full                                                             +---------+---------------+---------+-----------+----------+--------------+  POP       Full            Yes       Yes                                    +---------+---------------+---------+-----------+----------+--------------+  PTV                                                        Not visualized  +---------+---------------+---------+-----------+----------+--------------+  PERO  Not visualized  +---------+---------------+---------+-----------+----------+--------------+     Summary: Right: There is no evidence of deep vein thrombosis in the lower extremity. No cystic structure found in the popliteal fossa. Left: There is no evidence of deep vein thrombosis in the lower extremity. However, portions of this examination were  limited- see technologist comments above. No cystic structure found in the popliteal fossa.  *See table(s) above for measurements and observations. Electronically signed by Monica Martinez MD on 11/05/2018 at 2:18:41 PM.    Final     Microbiology: Recent Results (from the past 240 hour(s))  Culture, blood (routine x 2)     Status: None   Collection Time: 11/04/18  4:55 PM   Specimen: BLOOD  Result Value Ref Range Status   Specimen Description BLOOD RIGHT ANTECUBITAL  Final   Special Requests   Final    BOTTLES DRAWN AEROBIC ONLY Blood Culture results may not be optimal due to an inadequate volume of blood received in culture bottles   Culture   Final    NO GROWTH 5 DAYS Performed at [facility], [place] [address], [place], [place] [address]    Report Status 11/09/2018 FINAL  Final  Culture, blood (routine x 2)     Status: None   Collection Time: 11/04/18  4:59 PM   Specimen: BLOOD  Result Value Ref Range Status   Specimen Description BLOOD LEFT ANTECUBITAL  Final   Special Requests   Final    BOTTLES DRAWN AEROBIC AND ANAEROBIC Blood Culture results may not be optimal due to an inadequate volume of blood received in culture bottles   Culture   Final    NO GROWTH 5 DAYS Performed at [facility], [place] [address] [place], [place] [address]    Report Status 11/09/2018 FINAL  Final  SARS Coronavirus 2 (CEPHEID - Performed in [place] hospital lab), Hosp Order     Status: None   Collection Time: 11/04/18  5:40 PM   Specimen: Nasopharyngeal Swab  Result Value Ref Range Status   SARS Coronavirus 2 NEGATIVE NEGATIVE Final    Comment: (NOTE) If result is NEGATIVE SARS-CoV-2 target nucleic acids are NOT DETECTED. The SARS-CoV-2 RNA is generally detectable in upper and lower  respiratory specimens during the acute phase of infection. The lowest  concentration of SARS-CoV-2 viral copies this assay can detect is 250  copies / mL. A negative result does not preclude SARS-CoV-2  infection  and should not be used as the sole basis for treatment or other  patient management decisions.  A negative result may occur with  improper specimen collection / handling, submission of specimen other  than nasopharyngeal swab, presence of viral mutation(s) within the  areas targeted by this assay, and inadequate number of viral copies  (<250 copies / mL). A negative result must be combined with clinical  observations, patient history, and epidemiological information. If result is POSITIVE SARS-CoV-2 target nucleic acids are DETECTED. The SARS-CoV-2 RNA is generally detectable in upper and lower  respiratory specimens dur ing the acute phase of infection.  Positive  results are indicative of active infection with SARS-CoV-2.  Clinical  correlation with patient history and other diagnostic information is  necessary to determine patient infection status.  Positive results do  not rule out bacterial infection or co-infection with other viruses. If result is PRESUMPTIVE POSTIVE SARS-CoV-2 nucleic acids MAY BE PRESENT.   A presumptive positive result was obtained on the submitted specimen  and confirmed on repeat  testing.  While 2019 novel coronavirus  (SARS-CoV-2) nucleic acids may be present in the submitted sample  additional confirmatory testing may be necessary for epidemiological  and / or clinical management purposes  to differentiate between  SARS-CoV-2 and other Sarbecovirus currently known to infect humans.  If clinically indicated additional testing with an alternate test  methodology [phone]) is advised. The SARS-CoV-2 RNA is generally  detectable in upper and lower respiratory sp ecimens during the acute  phase of infection. The expected result is Negative. Fact Sheet for Patients:  StrictlyIdeas.no Fact Sheet for Healthcare Providers: BankingDealers.co.za This test is not yet approved or cleared by the [place]  FDA and has been authorized for detection and/or diagnosis of SARS-CoV-2 by FDA under an Emergency Use Authorization (EUA).  This EUA will remain in effect (meaning this test can be used) for the duration of the COVID-19 declaration under Section 564(b)(1) of the Act, 21 U.S.C. section 360bbb-3(b)(1), unless the authorization is terminated or revoked sooner. Performed at [facility], [place] [address], [place] [employer] [place], [place] [address]   Urine culture     Status: Abnormal   Collection Time: 11/04/18  9:28 PM   Specimen: Urine, Clean Catch  Result Value Ref Range Status   Specimen Description URINE, CLEAN CATCH  Final   Special Requests NONE  Final   Culture (A)  Final    <10,000 COLONIES/mL INSIGNIFICANT GROWTH Performed at [facility], [place] [address], [place], [place] [address]    Report Status 11/06/2018 FINAL  Final     Labs: CBC: Recent Labs  Lab 11/07/18 0443 11/08/18 0447 11/09/18 0435 11/09/18 0803 11/09/18 0805 11/09/18 0806 11/09/18 1054 11/10/18 0443  WBC 4.7 4.5 4.3  --   --   --  3.2* 4.4  NEUTROABS  --   --   --   --   --   --  2.0  --   HGB 8.5* 8.5* 8.6* 10.9* 10.2* 10.2* 8.4* 8.7*  HCT 29.4* 29.5* 29.8* 32.0* 30.0* 30.0* 29.7* 30.7*  MCV 74.2* 75.6* 74.7*  --   --   --  75.8* 76.2*  PLT 265 259 256  --   --   --  249 478   Basic Metabolic Panel: Recent Labs  Lab 11/06/18 0402  11/06/18 1401 11/07/18 0443 11/08/18 0447 11/09/18 0435 11/09/18 0803 11/09/18 0805 11/09/18 0806 11/10/18 0443 11/10/18 0911  NA 134*   < > 136 135 136 136 137 138 136  --  136  K 5.4*   < > 4.0 3.4* 3.4* 3.4* 3.2* 3.2* 3.3*  --  3.7  CL 93*   < > 92* 91* 91* 92*  --   --   --   --  91*  CO2 29   < > 34* 31 34* 33*  --   --   --   --  32  GLUCOSE 104*   < > 139* 103* 101* 106*  --   --   --   --  106*  BUN 19   < > 18 18 22  26*  --   --   --   --  27*  CREATININE 1.23*   < > 1.29* 1.22* 1.30* 1.44*  --   --   --   --  1.33*  CALCIUM 8.6*   < > 9.2 8.8* 9.0 8.9   --   --   --   --  9.6  MG 2.2  --   --  2.0 1.9 2.0  --   --   --  2.2  --    < > = values in this interval not displayed.   Liver Function Tests: No results for input(s): AST, ALT, ALKPHOS, BILITOT, PROT, ALBUMIN in the last 168 hours. No results for input(s): LIPASE, AMYLASE in the last 168 hours. No results for input(s): AMMONIA in the last 168 hours. Cardiac Enzymes: No results for input(s): CKTOTAL, CKMB, CKMBINDEX, TROPONINI in the last 168 hours. BNP (last 3 results) Recent Labs    10/06/18 2220 11/04/18 1715  BNP 235.7* 369.3*   CBG: No results for input(s): GLUCAP in the last 168 hours. Time spent: 35 minutes  Signed:  Berle Mull  [facility] 11/10/2018

## 2018-11-12 NOTE — Telephone Encounter (Signed)
Hinton Dyer, RN with [facility] is with the pt. Pt was in the hospital last week and D/C 11-10-18. She had [employer] on. Hinton Dyer took them off. Hinton Dyer has [employer]. Can she use these on the pt? [phone]

## 2018-11-12 NOTE — Telephone Encounter (Signed)
Zinc oxide is fine

## 2018-11-12 NOTE — Telephone Encounter (Signed)
Spoke to [place] her Dr Silvio Pate said it was fine to do the [place].  She wanted to see if Jackelyn Poling would order an ABI for the pt. They can do it at the pt's home.

## 2018-11-13 ENCOUNTER — Ambulatory Visit (INDEPENDENT_AMBULATORY_CARE_PROVIDER_SITE_OTHER): Payer: PPO

## 2018-11-13 DIAGNOSIS — Z7901 Long term (current) use of anticoagulants: Secondary | ICD-10-CM

## 2018-11-13 DIAGNOSIS — Z01810 Encounter for preprocedural cardiovascular examination: Secondary | ICD-10-CM

## 2018-11-13 LAB — POCT INR: INR: 1.4 — AB (ref 2.0–3.0)

## 2018-11-13 NOTE — Telephone Encounter (Signed)
Please call home health and ask if they are able to do the test with patient's significant swelling and what information they are hoping to gain? I'm not sure what we would be able to do with an abnormal test result.

## 2018-11-13 NOTE — Patient Instructions (Addendum)
INR today 1.4  Patient is to take 1.5 pills (3mg ) today (7/10) and tomorrow (7/11) and then restart prior dosing before elevated readings of 1 pill (2mg ) daily EXCEPT for 1/2 pill ( 1mg ) on sundays.  Recheck on Tuesday 11/17/18. I have spoken with home health nurse, Hinton Dyer, to go out and recheck her INR on 11/17/18 and call me with results.

## 2018-11-16 ENCOUNTER — Telehealth: Payer: Self-pay

## 2018-11-16 NOTE — Telephone Encounter (Signed)
[name] returned call from the office   C/B # [phone]

## 2018-11-16 NOTE — Telephone Encounter (Signed)
Spoke with Hinton Dyer from [facility]. Hinton Dyer states that they have a mobile imaging which is sent to pt home. She states that pt was sent home with the [facility] boots and that she had weak pedal pulses and she didn't feel comfortable putting a lot of compression on pt due to this. She wanted to do the imaging to see is pt had adequate perfusion and was wanting to know if she should continue using the una boots for the pt and if you would advise the test.

## 2018-11-16 NOTE — Telephone Encounter (Signed)
Spoke with Hinton Dyer and called back to leave detailed message with order instructions to check patient's PT/INR and call me with results.    Hinton Dyer states that she will work on getting order put in and collect lab tomorrow for patient.

## 2018-11-16 NOTE — Telephone Encounter (Signed)
Left VM for [name] from [facility] to call back to discuss notes below.

## 2018-11-17 NOTE — Telephone Encounter (Signed)
Called [facility] nurse, no answer, left message. Will try again later.

## 2018-11-18 ENCOUNTER — Other Ambulatory Visit: Payer: Self-pay

## 2018-11-18 ENCOUNTER — Ambulatory Visit (INDEPENDENT_AMBULATORY_CARE_PROVIDER_SITE_OTHER): Payer: PPO

## 2018-11-18 ENCOUNTER — Encounter (HOSPITAL_COMMUNITY): Payer: Self-pay | Admitting: Cardiology

## 2018-11-18 ENCOUNTER — Ambulatory Visit (HOSPITAL_COMMUNITY)
Admit: 2018-11-18 | Discharge: 2018-11-18 | Disposition: A | Discharge status: Home / Self Care | Payer: PPO | Source: Ambulatory Visit | Attending: Cardiology | Admitting: Cardiology

## 2018-11-18 VITALS — BP 142/59 | HR 79 | Wt 161.0 lb

## 2018-11-18 DIAGNOSIS — Z7901 Long term (current) use of anticoagulants: Secondary | ICD-10-CM

## 2018-11-18 DIAGNOSIS — Z95810 Presence of automatic (implantable) cardiac defibrillator: Secondary | ICD-10-CM | POA: Insufficient documentation

## 2018-11-18 DIAGNOSIS — J449 Chronic obstructive pulmonary disease, unspecified: Secondary | ICD-10-CM | POA: Diagnosis not present

## 2018-11-18 DIAGNOSIS — Z8249 Family history of ischemic heart disease and other diseases of the circulatory system: Secondary | ICD-10-CM | POA: Insufficient documentation

## 2018-11-18 DIAGNOSIS — I4821 Permanent atrial fibrillation: Secondary | ICD-10-CM | POA: Insufficient documentation

## 2018-11-18 DIAGNOSIS — I428 Other cardiomyopathies: Secondary | ICD-10-CM | POA: Insufficient documentation

## 2018-11-18 DIAGNOSIS — Z79899 Other long term (current) drug therapy: Secondary | ICD-10-CM | POA: Diagnosis not present

## 2018-11-18 DIAGNOSIS — Z87891 Personal history of nicotine dependence: Secondary | ICD-10-CM | POA: Insufficient documentation

## 2018-11-18 DIAGNOSIS — E785 Hyperlipidemia, unspecified: Secondary | ICD-10-CM | POA: Insufficient documentation

## 2018-11-18 DIAGNOSIS — I4819 Other persistent atrial fibrillation: Secondary | ICD-10-CM

## 2018-11-18 DIAGNOSIS — D649 Anemia, unspecified: Secondary | ICD-10-CM | POA: Diagnosis not present

## 2018-11-18 DIAGNOSIS — I2510 Atherosclerotic heart disease of native coronary artery without angina pectoris: Secondary | ICD-10-CM | POA: Diagnosis not present

## 2018-11-18 DIAGNOSIS — N183 Chronic kidney disease, stage 3 (moderate): Secondary | ICD-10-CM | POA: Insufficient documentation

## 2018-11-18 DIAGNOSIS — I130 Hypertensive heart and chronic kidney disease with heart failure and stage 1 through stage 4 chronic kidney disease, or unspecified chronic kidney disease: Secondary | ICD-10-CM | POA: Diagnosis not present

## 2018-11-18 DIAGNOSIS — Z01810 Encounter for preprocedural cardiovascular examination: Secondary | ICD-10-CM

## 2018-11-18 DIAGNOSIS — I272 Pulmonary hypertension, unspecified: Secondary | ICD-10-CM | POA: Insufficient documentation

## 2018-11-18 DIAGNOSIS — I5032 Chronic diastolic (congestive) heart failure: Secondary | ICD-10-CM

## 2018-11-18 LAB — BASIC METABOLIC PANEL
Anion gap: 10 (ref 5–15)
BUN: 16 mg/dL (ref 8–23)
CO2: 30 mmol/L (ref 22–32)
Calcium: 9.4 mg/dL (ref 8.9–10.3)
Chloride: 98 mmol/L (ref 98–111)
Creatinine, Ser: 1.24 mg/dL — ABNORMAL HIGH (ref 0.44–1.00)
GFR calc Af Amer: 47 mL/min — ABNORMAL LOW (ref >60)
GFR calc non Af Amer: 40 mL/min — ABNORMAL LOW (ref >60)
Glucose, Bld: 98 mg/dL (ref 70–99)
Potassium: 4.9 mmol/L (ref 3.5–5.1)
Sodium: 138 mmol/L (ref 135–145)

## 2018-11-18 LAB — CBC
HCT: 35.5 % — ABNORMAL LOW (ref 36.0–46.0)
Hemoglobin: 10.1 g/dL — ABNORMAL LOW (ref 12.0–15.0)
MCH: 22.4 pg — ABNORMAL LOW (ref 26.0–34.0)
MCHC: 28.5 g/dL — ABNORMAL LOW (ref 30.0–36.0)
MCV: 78.9 fL — ABNORMAL LOW (ref 80.0–100.0)
Platelets: 262 10*3/uL (ref 150–400)
RBC: 4.5 MIL/uL (ref 3.87–5.11)
RDW: 25.7 % — ABNORMAL HIGH (ref 11.5–15.5)
WBC: 5.3 10*3/uL (ref 4.0–10.5)
nRBC: 0 % (ref 0.0–0.2)

## 2018-11-18 LAB — POCT INR: INR: 1.9 — AB (ref 2.0–3.0)

## 2018-11-18 MED ORDER — SPIRONOLACTONE 25 MG PO TABS: 25.0000 mg | ORAL_TABLET | Freq: Every day | ORAL | 3 refills | Status: DC

## 2018-11-18 MED ORDER — POTASSIUM CHLORIDE CRYS ER 20 MEQ PO TBCR: EXTENDED_RELEASE_TABLET | ORAL | 3 refills | Status: DC

## 2018-11-18 MED ORDER — TORSEMIDE 20 MG PO TABS: ORAL_TABLET | ORAL | 3 refills | Status: DC

## 2018-11-18 NOTE — Telephone Encounter (Signed)
Home health nurse was not able to check lab yesterday and patient did take the 2mg  (her usual dosing) last pm in meantime.  I spoke with Judeen Hammans [name] manager [place]) [phone] who discussed with me our needs and reassured me that they will get INR checked today and resulted to me. She apologized in the lapse of care and is taking over to ensure patient is being appropriately cared for.  She asked that we call her directly if we have any additional care concerns.    I called patient and discussed with her.  She is not happy with her home care experience through [facility] and is interested in exploring other options if care does not improve.  I will need to investigate which home health companies work with her insurance, [employer]    Also, we discussed getting her an in home coumadin meter where she can be enrolled in my home monitoring program through [employer].    I will request the paperwork and begin the insurance benefit summary process as I feel patient would be appropriate for this at this point due to her difficulty in getting in and out of the office.   Will forward to PCP as an FYI.

## 2018-11-18 NOTE — Telephone Encounter (Signed)
Noted  

## 2018-11-18 NOTE — Patient Instructions (Signed)
INR today 1.9  Patient is to continue prior dosing of 2mg  daily and will have home health nurse recheck in 1 week. Both nurse and patient aware of dosing instructions and verbalize understanding.    Patient is stable at this point and we will continue to monitor closely.

## 2018-11-18 NOTE — Patient Instructions (Signed)
Labs were done today. We will call you with any ABNORMAL results. No news is good news!  INCREASE Torsemide to 80 mg (4 tabs) in the AM and 40 mg (2 tabs) in the PM.  INCREASE Potassium to 40 mEq (2 tabs) in the AM and 20 mEq (1 tab) in the PM.   INCREASE Spironolactone to 25 mg (1 tab) each day.  You have been referred to [facility] to access you for anemia.    Your physician recommends that you schedule a follow-up appointment in: 1 week with Amy.   At the [facility], you and your health needs are our priority. As part of our continuing mission to provide you with exceptional heart care, we have created designated Provider Care Teams. These Care Teams include your primary Cardiologist (physician) and Advanced Practice Providers (APPs- Physician Assistants and Nurse Practitioners) who all work together to provide you with the care you need, when you need it.   You may see any of the following providers on your designated Care Team at your next follow up: Marland Kitchen Dr Glori Bickers . Dr Loralie Champagne . Darrick Grinder, NP

## 2018-11-19 NOTE — Progress Notes (Signed)
PCP: Elby Beck, FNP Cardiology: Dr. Rockey Situ HF Cardiology: Dr. [name]  Patient has history of permanent atrial fibrillation, chronic diastolic CHF, CKD stage 3, smoking/prior COPD, renal cell CA s/p right nephrectomy.  She has a prior history of HFrEF with nonischemic cardiomyoapthy and had a Medtronic ICD placed.  However, subsequently her EF has recovered.  More recently, she has had symptomatic HFpEF.    She has had dyspnea and edema for "a long time," but it has been worse since January of this year. In 6/20, she had left hip ORIF after mechanical fall.  Echo in 6/20 showed EF 60-65%, mild RV dilation with normal systolic function, PASP 93 mmHg.  After she got home from the hospital stay post-ORIF, she noted marked peripheral edema.  She was short of breath walking short distances.  +orthopnea.  She came back to the hospital on 11/04/18 and was admitted with volume overload.  She was diuresed aggressively and lost about 22 lbs.  RHC showed pulmonary venous hypertension.  PCWP tracing had prominent v-waves in absence of significant MR, suggesting significant diastolic dysfunction.   She has been noted to have significant Fe deficiency anemia, but FOBT was negative during 7/20 hospitalization.   She was discharged home on torsemide and presents for followup of CHF.  She has gained about 4 lbs since discharge.  Not very active, gets short of breath if she walks fast.  Generally doing ok around the house but short of breath if she walks longer distances.  She continues to sleep in a recliner due to orthopnea.  Peripheral edema is building up again.  Legs are wrapped by home health.   Medtronic device interrogation: stable thoracic impedance with fluid index < threshold.   Labs (7/20): K 3.7, creatinine 1.44 => 1.33  PMH: 1. COPD 2. Renal cell carcinoma: S/p right kidney resection.  3. CKD stage 3 4. Fe deficiency anemia 5. Atrial fibrillation: Permanent. She has failed amiodarone, Tikosyn,  and flecainide as well as multiple cardioversions. 6. Chronic diastolic CHF: She has history of prior HFrEF with nonischemic cardiomyopathy.  She has a Medtronic ICD.   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  - More recently, EF has been in normal range but she has had diastolic CHF.  - Echo (4/09): EF 60-65%, mildly dilated RV with PASP 93 mmHg, mild-moderate TR.  - RHC (7/20): mean RA 11, PA 64/18 mean 39, mean PCWP 24 with v waves to 41, CI 4.21, PVR 1.9 WU.  7. Hyperlipidemia 8. Carotid stenosis: Carotid dopplers (8/11) with 91-47% LICA stenosis.   Social History   Socioeconomic History  . Marital status: Widowed    Spouse name: Not on file  . Number of children: Not on file  . Years of education: Not on file  . Highest education level: Not on file  Occupational History  . Occupation: Retired    Fish farm manager: RETIRED  Social Needs  . Financial resource strain: Not on file  . Food insecurity    Worry: Not on file    Inability: Not on file  . Transportation needs    Medical: Not on file    Non-medical: Not on file  Tobacco Use  . Smoking status: Former Smoker    Packs/day: 0.50    Years: 40.00    Pack years: 20.00    Types: Cigarettes    Quit date: 05/06/2001    Years since quitting: 17.5  . Smokeless tobacco: Never Used  Substance and Sexual Activity  .  Alcohol use: No    Alcohol/week: 0.0 standard drinks  . Drug use: No  . Sexual activity: Never  Lifestyle  . Physical activity    Days per week: Not on file    Minutes per session: Not on file  . Stress: Not on file  Relationships  . Social Herbalist on phone: Not on file    Gets together: Not on file    Attends religious service: Not on file    Active member of club or organization: Not on file    Attends meetings of clubs or organizations: Not on file    Relationship status: Not on file  . Intimate partner violence    Fear of current or ex partner: Not on file    Emotionally abused: Not on file     Physically abused: Not on file    Forced sexual activity: Not on file  Other Topics Concern  . 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   ALCOHOL USE-NO   RETIRED   FORMER TOBACCO USE.....1/2 PACK X 40 YRS UNTIL 2003   Would desire CPR   Family History  Problem Relation Age of Onset  . Heart failure Mother   . Breast cancer Maternal Aunt 82  . Breast cancer Cousin   . Breast cancer Other   . Colon cancer Neg Hx    ROS: All systems reviewed and negative except as per HPI.   Current Outpatient Medications  Medication Sig Dispense Refill  . acetaminophen (TYLENOL) 325 MG tablet Take 2 tablets (650 mg total) by mouth every 6 (six) hours as needed for mild pain or moderate pain (or Fever >/= 101).    . Ascorbic Acid (VITAMIN C) 100 MG tablet Take 100 mg by mouth daily.    . bisacodyl (DULCOLAX) 5 MG EC tablet Take 1 tablet (5 mg total) by mouth daily as needed for moderate constipation. 10 tablet 0  . cetirizine (ZYRTEC) 10 MG tablet Take 10 mg by mouth daily.     Marland Kitchen conjugated estrogens (PREMARIN) vaginal cream Place 1 Applicatorful vaginally daily. (Patient taking differently: Place 1 Applicatorful vaginally daily as needed (vaginal dryness). ) 42.5 g 12  . CRANBERRY PO Take 4 tablets by mouth daily.    Marland Kitchen docusate sodium (COLACE) 100 MG capsule Take 1 capsule (100 mg total) by mouth 2 (two) times daily. (Patient taking differently: Take 100 mg by mouth 2 (two) times daily as needed. ) 10 capsule 0  . ezetimibe (ZETIA) 10 MG tablet Take 1 tablet (10 mg total) by mouth daily. 30 tablet 11  . feeding supplement, ENSURE ENLIVE, (ENSURE ENLIVE) LIQD Take 237 mLs by mouth 2 (two) times daily between meals. 237 mL 12  . ferrous sulfate 325 (65 FE) MG tablet Take 325 mg by mouth daily with breakfast.    . fluticasone (FLONASE) 50  MCG/ACT nasal spray Place 2 sprays into both nostrils daily. (Patient taking differently: Place 2 sprays into both nostrils daily as needed for allergies or rhinitis. ) 16 g 6  . hydrocortisone 2.5 % cream Apply 1 application topically daily as needed (itching).     Marland Kitchen levothyroxine (SYNTHROID) 25 MCG tablet Take 1 tablet (25 mcg total) by mouth daily before breakfast. 30 tablet 3  . magnesium oxide (MAG-OX)  400 MG tablet Take 1 tablet (400 mg total) by mouth daily. 30 tablet 2  . potassium chloride SA (K-DUR) 20 MEQ tablet Take 2 tablets (40 mEq total) by mouth every morning AND 1 tablet (20 mEq total) at bedtime. Take 1 tablet (20 meq) by mouth twice daily, except on metolazone days take 2 tablets (40 meq) twice daily. 270 tablet 3  . rosuvastatin (CRESTOR) 40 MG tablet Take 1 tablet (40 mg total) by mouth daily. 90 tablet 3  . spironolactone (ALDACTONE) 25 MG tablet Take 1 tablet (25 mg total) by mouth daily. 90 tablet 3  . torsemide (DEMADEX) 20 MG tablet Take 4 tablets (80 mg total) by mouth every morning AND 2 tablets (40 mg total) at bedtime. 540 tablet 3  . warfarin (COUMADIN) 2 MG tablet Take 1 tablet (2 mg total) by mouth as directed. Take 2 mg on Monday- Saturday and 1 Mg on Sunday (Patient taking differently: Take 1-2 mg by mouth See admin instructions. Take 2 mg on Monday- Saturday and 1 Mg on Sunday)     No current facility-administered medications for this encounter.    BP (!) 142/59   Pulse 79   Wt 73 kg (161 lb)   SpO2 100%   BMI 25.60 kg/m  General: NAD Neck: JVP 9-10 cm, no thyromegaly or thyroid nodule.  Lungs: Clear to auscultation bilaterally with normal respiratory effort. CV: Nondisplaced PMI.  Heart irregular S1/S2, no S3/S4, 2/6 SEM RUSB.  1+ edema to knees L>R, legs wrapped.  No carotid bruit.  Normal pedal pulses.  Abdomen: Soft, nontender, no hepatosplenomegaly, no distention.  Skin: Intact without lesions or rashes.  Neurologic: Alert and oriented x 3.  Psych:  Normal affect. Extremities: No clubbing or cyanosis.  HEENT: Normal.   Assessment/Plan: 1. Chronic diastolic CHF: Echo in 0/92 showed EF 60-65%, mild RV dilation with normal systolic function, PASP 93 mmHg. She had remote nonischemic cardiomyopathy with recovery of EF, has Medtronic ICD. I suspect that there is significant RV dysfunction. Atrial fibrillation likely contributes, but this appears to be permanent now as she has failed multiple anti-arrhythmics and is reasonably rate-controlled. [place] 11/09/18 showed mild-moderately elevated PCWP and RA pressure, primarily pulmonary venous hypertension.  Prominent v-waves on PCWP tracing may be due to stiff ventricle/diastolic dysfunction as she had only mild MR on recent echo.  Since discharge from hospital, she has begun to build up volume again.  Her Medtronic device thoracic impedance monitor does not suggest volume overload but I think that this is inaccurate.  - Increase torsemide to 80 mg bid x 3 days then 80 qam/40 qpm.  Increase KCl to 40 qam/20 qpm.  - Increase spironolactone to 25 mg daily.  - BMET today and again in 10 days.  - I will see if we can get approval to place a Cardiomems device.  We discussed this today.  2. Pulmonary hypertension: PA systolic pressure estimated 93 mmHg on echo. Based on [place] 11/09/18, she has primarily pulmonary venous hypertension.  3. CKD: Stage 3. BMET today, follow closely.   4. Anemia: Fe deficiency. She denies overt bleeding. She is Fe deficient. FOBT was negative during 7/20 admission, she got IV Fe.  -I will refer her to hematology for evaluation.  - CBC today.  5. Atrial fibrillation: Permanent now. Rate control is reasonable. She has failed amiodarone, Tikosyn, and flecainide as well as multiple cardioversions.  - Continue warfarin.   She will followup with NP in 1 week to reassess volume  status.   Loralie Champagne 11/19/2018

## 2018-11-20 ENCOUNTER — Ambulatory Visit (INDEPENDENT_AMBULATORY_CARE_PROVIDER_SITE_OTHER): Payer: PPO | Admitting: Family Medicine

## 2018-11-20 ENCOUNTER — Encounter: Payer: Self-pay | Admitting: Family Medicine

## 2018-11-20 ENCOUNTER — Other Ambulatory Visit: Payer: Self-pay

## 2018-11-20 VITALS — BP 150/62 | HR 68 | Temp 98.2°F | Resp 20 | Ht 66.5 in | Wt 162.0 lb

## 2018-11-20 DIAGNOSIS — Z09 Encounter for follow-up examination after completed treatment for conditions other than malignant neoplasm: Secondary | ICD-10-CM

## 2018-11-20 DIAGNOSIS — I5032 Chronic diastolic (congestive) heart failure: Secondary | ICD-10-CM | POA: Diagnosis not present

## 2018-11-20 DIAGNOSIS — N184 Chronic kidney disease, stage 4 (severe): Secondary | ICD-10-CM

## 2018-11-20 DIAGNOSIS — Z7901 Long term (current) use of anticoagulants: Secondary | ICD-10-CM | POA: Diagnosis not present

## 2018-11-20 NOTE — Progress Notes (Signed)
Subjective:    Patient ID: [name], female    DOB: 1934/04/19, 85 y.o.   MRN: [id number]  HPI This is an 85 yo female, accompanied by her granddaughter, who presents today for hospital follow up.  She was admitted 11/04/2018- 11/10/2018 with symptomatic anemia, acute on chronic heart failure. She was transfused and diuresed. She has had follow up cbc and bmet on 11/18/2018. HCT improved, electrolytes normal, creatinine stable. She has been having [place] visits. She has weekly una boots placed for swelling. She had home PT yesterday for left hip replacement last month. She is doing daily weights and is being followed closely by the CHF clinic. Her weight has increased by several pounds and she has follow up with CHF clinic next week. She feels that she is overall much stronger than when she went in to the hospital. Left hip- had been pain free until a couple of days ago when she got into her daughter's vehicle and has had some burning. Had home PT yesterday and did well. She has follow up with ortho next week.    Current Outpatient Medications  Medication Instructions  . acetaminophen (TYLENOL) 650 mg, Oral, Every 6 hours PRN  . bisacodyl (DULCOLAX) 5 mg, Oral, Daily PRN  . cetirizine (ZYRTEC) 10 mg, Daily  . conjugated estrogens (PREMARIN) vaginal cream 1 Applicatorful, Vaginal, Daily  . CRANBERRY PO 4 tablets, Oral, Daily  . docusate sodium (COLACE) 100 mg, Oral, 2 times daily  . ezetimibe (ZETIA) 10 mg, Oral, Daily  . feeding supplement, ENSURE ENLIVE, (ENSURE ENLIVE) LIQD 237 mLs, Oral, 2 times daily between meals  . ferrous sulfate 325 mg, Oral, Daily with breakfast  . fluticasone (FLONASE) 50 MCG/ACT nasal spray 2 sprays, Each Nare, Daily  . hydrocortisone 2.5 % cream 1 application, Topical, Daily PRN  . levothyroxine (SYNTHROID) 25 mcg, Oral, Daily before breakfast  . magnesium oxide (MAG-OX) 400 mg, Oral, Daily  . potassium chloride SA (K-DUR) 20 MEQ tablet Take 2 tablets (40 mEq  total) by mouth every morning AND 1 tablet (20 mEq total) at bedtime. Take 1 tablet (20 meq) by mouth twice daily, except on metolazone days take 2 tablets (40 meq) twice daily.  . rosuvastatin (CRESTOR) 40 mg, Oral, Daily  . spironolactone (ALDACTONE) 25 mg, Oral, Daily  . torsemide (DEMADEX) 20 MG tablet Take 4 tablets (80 mg total) by mouth every morning AND 2 tablets (40 mg total) at bedtime.  . vitamin C 100 mg, Oral, Daily  . warfarin (COUMADIN) 2 mg, Oral, As directed, Take 2 mg on Monday- Saturday and 1 Mg on Sunday   Past Medical History:  Diagnosis Date  . Adjustment disorder with anxiety   . Anginal pain ([place])   . Arthritis    "some in my hands" (11/11/2012)  . Atrial fibrillation ([place])   . Automatic implantable cardioverter-defibrillator in situ   . Blood in stool   . Carotid artery stenosis 09/2007   a. 09/2007: 60-79% bilateral (stable); b. 10/2008: 40-59% R 60-79%   . Chest pain, unspecified   . Chronic airway obstruction, not elsewhere classified   . Chronic bronchitis ([place])    "get it some; not q year" (11/11/2012)  . Chronic diastolic CHF (congestive heart failure) ([place]) 06/04/2013  . Chronic kidney disease, unspecified   . Coronary artery disease    non-obstructive by 2006 cath  . Frequent UTI    "get them a couple times/yr" (11/11/2012)  . Hemorrhage of rectum and anus   .  High cholesterol   . History of blood transfusion 04/2011   "after hip OR" (11/11/2012)  . Hypertension 05/20/2011  . Hypertr obst cardiomyop   . Hypotension, unspecified    cardiac cath 2006..nonobstructive CAD 30-40s lesions.Marland KitchenETT 1/09 nondiagnostic due to poor HR response..Right Renal Cancer 2003  . Long term (current) use of anticoagulants   . Malignant neoplasm of kidney, except pelvis   . Osteoarthritis of right hip   . Other and unspecified coagulation defects   . PONV (postoperative nausea and vomiting)   . Renal cancer ([place]) 06/2001   Right  . Secondary cardiomyopathy, unspecified   .  Sinus bradycardia   . Urge incontinence    Past Surgical History:  Procedure Laterality Date  . ABDOMINAL HYSTERECTOMY  1975   for benign causes  . APPENDECTOMY    . BI-VENTRICULAR PACEMAKER UPGRADE  05/04/2010  . CARDIAC CATHETERIZATION  2006  . CARDIOVERSION N/A 02/20/2018   Procedure: CARDIOVERSION;  Surgeon: Minna Merritts, MD;  Location: [facility];  Service: Cardiovascular;  Laterality: N/A;  . CARDIOVERSION N/A 03/27/2018   Procedure: CARDIOVERSION;  Surgeon: Minna Merritts, MD;  Location: [facility];  Service: Cardiovascular;  Laterality: N/A;  . CATARACT EXTRACTION W/ INTRAOCULAR LENS  IMPLANT, BILATERAL  01/2006-02-2006  . CHOLECYSTECTOMY N/A 11/11/2012   Procedure: LAPAROSCOPIC CHOLECYSTECTOMY WITH INTRAOPERATIVE CHOLANGIOGRAM;  Surgeon: Imogene Burn. Georgette Dover, MD;  Location: [facility];  Service: General;  Laterality: N/A;  . EP IMPLANTABLE DEVICE N/A 02/21/2016   Procedure: ICD Generator Changeout;  Surgeon: Deboraha Sprang, MD;  Location: [facility];  Service: Cardiovascular;  Laterality: N/A;  . INSERT / REPLACE / REMOVE PACEMAKER  05-01-11   02-28-05-/05-04-10-ICD-MEDTRONIC MAXIMAL DR  . JOINT REPLACEMENT    . LAPAROSCOPIC CHOLECYSTECTOMY  11/11/2012  . LAPAROSCOPIC LYSIS OF ADHESIONS N/A 11/11/2012   Procedure: LAPAROSCOPIC LYSIS OF ADHESIONS;  Surgeon: Imogene Burn. Georgette Dover, MD;  Location: [facility];  Service: General;  Laterality: N/A;  . NEPHRECTOMY Right 06/2001    S/P RENAL CELL CANCER  . RIGHT HEART CATH N/A 11/09/2018   Procedure: RIGHT HEART CATH;  Surgeon: Larey Dresser, MD;  Location: [facility];  Service: Cardiovascular;  Laterality: N/A;  . TOTAL HIP ARTHROPLASTY Right 05/03/2011   Procedure: TOTAL HIP ARTHROPLASTY ANTERIOR APPROACH;  Surgeon: Mcarthur Rossetti;  Location: [facility];  Service: Orthopedics;  Laterality: Right;  Removal of Cannulated Screws Right Hip, Right Direct Anterior Hip Replacement  . TOTAL HIP ARTHROPLASTY Left 10/09/2018   Procedure: TOTAL  HIP ARTHROPLASTY ANTERIOR APPROACH;  Surgeon: Rod Can, MD;  Location: [facility];  Service: Orthopedics;  Laterality: Left;   Family History  Problem Relation Age of Onset  . Heart failure Mother   . Breast cancer Maternal Aunt 82  . Breast cancer Cousin   . Breast cancer Other   . Colon cancer Neg Hx    Social History   Tobacco Use  . Smoking status: Former Smoker    Packs/day: 0.50    Years: 40.00    Pack years: 20.00    Types: Cigarettes    Quit date: 05/06/2001    Years since quitting: 17.5  . Smokeless tobacco: Never Used  Substance Use Topics  . Alcohol use: No    Alcohol/week: 0.0 standard drinks  . Drug use: No      Review of Systems Per HPI    Objective:   Physical Exam Vitals signs reviewed.  Constitutional:      General: She is not in  acute distress.    Appearance: Normal appearance. She is normal weight. She is ill-appearing (chronically). She is not toxic-appearing or diaphoretic.  HENT:     Head: Normocephalic and atraumatic.  Eyes:     Conjunctiva/sclera: Conjunctivae normal.  Cardiovascular:     Rate and Rhythm: Normal rate and regular rhythm.     Heart sounds: Normal heart sounds.  Pulmonary:     Effort: Pulmonary effort is normal.     Breath sounds: Normal breath sounds.  Musculoskeletal:     Right lower leg: Edema present.     Left lower leg: Edema present.     Comments: Bilateral LE edema, legs wrapped in [place] boots with L>R edema.   Neurological:     Mental Status: She is alert and oriented to person, place, and time.  Psychiatric:        Mood and Affect: Mood normal.        Behavior: Behavior normal.        Thought Content: Thought content normal.        Judgment: Judgment normal.       BP (!) 150/62   Pulse 68   Temp 98.2 F (36.8 C)   Resp 20   Ht 5' 6.5" (1.689 m)   Wt 162 lb (73.5 kg)   SpO2 95%   BMI 25.76 kg/m  Wt Readings from Last 3 Encounters:  11/20/18 162 lb (73.5 kg)  11/18/18 161 lb (73 kg)  11/10/18 154  lb 3.2 oz (69.9 kg)       Assessment & Plan:  1. Hospital discharge follow-up - patient with understanding of hospital course and discharge instructions - continue home health - follow up in 3 months  2. Long term (current) use of anticoagulants - last INR 1.9, warfarin adjusted and follow-up scheduled  3. Chronic kidney disease (CKD), stage IV (severe) (HCC) -Creatinine stable on most recent blood draw, continue follow-up with nephrology  4. Chronic diastolic CHF (congestive heart failure) ([place]) -Continue daily weights and follow-up with CHF clinic   Clarene Reamer, FNP-BC  [facility]  11/20/2018 4:56 PM

## 2018-11-20 NOTE — Patient Instructions (Addendum)
Good to see you today  Please follow up with me in 3 months, sooner if needed.   Dr. Marigene Ehlers talked about Cardiomems device

## 2018-11-24 ENCOUNTER — Encounter: Payer: PPO | Admitting: Internal Medicine

## 2018-11-24 NOTE — Progress Notes (Signed)
PCP: Elby Beck, FNP Cardiology: Dr. Rockey Situ HF Cardiology: Dr. [name]  Patient has history of permanent atrial fibrillation, chronic diastolic CHF, CKD stage 3, smoking/prior COPD, renal cell CA s/p right nephrectomy.  She has a prior history of HFrEF with nonischemic cardiomyoapthy and had a Medtronic ICD placed.  However, subsequently her EF has recovered.  More recently, she has had symptomatic HFpEF.    She has had dyspnea and edema for "a long time," but it has been worse since January of this year. In 6/20, she had left hip ORIF after mechanical fall.  Echo in 6/20 showed EF 60-65%, mild RV dilation with normal systolic function, PASP 93 mmHg.  After she got home from the hospital stay post-ORIF, she noted marked peripheral edema.  She was short of breath walking short distances.  +orthopnea.  She came back to the hospital on 11/04/18 and was admitted with volume overload.  She was diuresed aggressively and lost about 22 lbs.  RHC showed pulmonary venous hypertension.  PCWP tracing had prominent v-waves in absence of significant MR, suggesting significant diastolic dysfunction.   She has been noted to have significant Fe deficiency anemia, but FOBT was negative during 7/20 hospitalization.   She returns for HF follow up. Saw Dr [name] last week and torsemide was increased to 80 mg twice a day for 3 days then back to 80  mg/40mg  however she never increased. Overall feeling much better. Mild SOB with exertion. Denies PND/Orthopnea. No bleeding issues.  Limited by hip pain.  Appetite ok.  No fever or chills. Weight at home 158 -159  pounds. Taking all medications. Followed by [facility] for lower extremity leg wraps.    Labs (11/10/18): K 3.7, creatinine 1.33 11/18/18: K 4.9 Creatinine 1.24   PMH: 1. COPD 2. Renal cell carcinoma: S/p right kidney resection.  3. CKD stage 3 4. Fe deficiency anemia 5. Atrial fibrillation: Permanent. She has failed amiodarone, Tikosyn, and flecainide as well as  multiple cardioversions. 6. Chronic diastolic CHF: She has history of prior HFrEF with nonischemic cardiomyopathy.  She has a Medtronic ICD.   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  - More recently, EF has been in normal range but she has had diastolic CHF.  - Echo (6/23): EF 60-65%, mildly dilated RV with PASP 93 mmHg, mild-moderate TR.  - RHC (7/20): mean RA 11, PA 64/18 mean 39, mean PCWP 24 with v waves to 41, CI 4.21, PVR 1.9 WU.  7. Hyperlipidemia 8. Carotid stenosis: Carotid dopplers (7/62) with 83-15% LICA stenosis.   Social History   Socioeconomic History  . Marital status: Widowed    Spouse name: Not on file  . Number of children: Not on file  . Years of education: Not on file  . Highest education level: Not on file  Occupational History  . Occupation: Retired    Fish farm manager: RETIRED  Social Needs  . Financial resource strain: Not on file  . Food insecurity    Worry: Not on file    Inability: Not on file  . Transportation needs    Medical: Not on file    Non-medical: Not on file  Tobacco Use  . Smoking status: Former Smoker    Packs/day: 0.50    Years: 40.00    Pack years: 20.00    Types: Cigarettes    Quit date: 05/06/2001    Years since quitting: 17.5  . Smokeless tobacco: Never Used  Substance and Sexual Activity  . Alcohol use: No  Alcohol/week: 0.0 standard drinks  . Drug use: No  . Sexual activity: Never  Lifestyle  . Physical activity    Days per week: Not on file    Minutes per session: Not on file  . Stress: Not on file  Relationships  . Social Herbalist on phone: Not on file    Gets together: Not on file    Attends religious service: Not on file    Active member of club or organization: Not on file    Attends meetings of clubs or organizations: Not on file    Relationship status: Not on file  . Intimate partner violence    Fear of current or ex partner: Not on file    Emotionally abused: Not on file    Physically abused: Not on  file    Forced sexual activity: Not on file  Other Topics Concern  . 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   ALCOHOL USE-NO   RETIRED   FORMER TOBACCO USE.....1/2 PACK X 40 YRS UNTIL 2003   Would desire CPR   Family History  Problem Relation Age of Onset  . Heart failure Mother   . Breast cancer Maternal Aunt 82  . Breast cancer Cousin   . Breast cancer Other   . Colon cancer Neg Hx    ROS: All systems reviewed and negative except as per HPI.   Current Outpatient Medications  Medication Sig Dispense Refill  . acetaminophen (TYLENOL) 325 MG tablet Take 2 tablets (650 mg total) by mouth every 6 (six) hours as needed for mild pain or moderate pain (or Fever >/= 101).    . Ascorbic Acid (VITAMIN C) 100 MG tablet Take 100 mg by mouth daily.    . bisacodyl (DULCOLAX) 5 MG EC tablet Take 1 tablet (5 mg total) by mouth daily as needed for moderate constipation. 10 tablet 0  . cetirizine (ZYRTEC) 10 MG tablet Take 10 mg by mouth daily.     Marland Kitchen conjugated estrogens (PREMARIN) vaginal cream Place 1 Applicatorful vaginally daily. (Patient taking differently: Place 1 Applicatorful vaginally daily as needed (vaginal dryness). ) 42.5 g 12  . CRANBERRY PO Take 4 tablets by mouth daily.    Marland Kitchen docusate sodium (COLACE) 100 MG capsule Take 1 capsule (100 mg total) by mouth 2 (two) times daily. (Patient taking differently: Take 100 mg by mouth 2 (two) times daily as needed. ) 10 capsule 0  . ezetimibe (ZETIA) 10 MG tablet Take 1 tablet (10 mg total) by mouth daily. 30 tablet 11  . feeding supplement, ENSURE ENLIVE, (ENSURE ENLIVE) LIQD Take 237 mLs by mouth 2 (two) times daily between meals. 237 mL 12  . ferrous sulfate 325 (65 FE) MG tablet Take 325 mg by mouth daily with breakfast.    . fluticasone (FLONASE) 50 MCG/ACT nasal spray Place 2  sprays into both nostrils daily. (Patient taking differently: Place 2 sprays into both nostrils daily as needed for allergies or rhinitis. ) 16 g 6  . hydrocortisone 2.5 % cream Apply 1 application topically daily as needed (itching).     Marland Kitchen levothyroxine (SYNTHROID) 25 MCG tablet Take 1 tablet (25 mcg total) by mouth daily before breakfast. 30 tablet 3  . magnesium oxide (MAG-OX) 400 MG tablet Take 1 tablet (  400 mg total) by mouth daily. 30 tablet 2  . Multiple Vitamins-Iron (MULTIVITAMINS WITH IRON) TABS tablet Take 1 tablet by mouth daily.    . potassium chloride SA (K-DUR) 20 MEQ tablet Take 2 tablets (40 mEq total) by mouth every morning AND 1 tablet (20 mEq total) at bedtime. Take 1 tablet (20 meq) by mouth twice daily, except on metolazone days take 2 tablets (40 meq) twice daily. 270 tablet 3  . rosuvastatin (CRESTOR) 40 MG tablet Take 1 tablet (40 mg total) by mouth daily. 90 tablet 3  . spironolactone (ALDACTONE) 25 MG tablet Take 1 tablet (25 mg total) by mouth daily. 90 tablet 3  . torsemide (DEMADEX) 20 MG tablet Take 4 tablets (80 mg total) by mouth every morning AND 2 tablets (40 mg total) at bedtime. 540 tablet 3  . warfarin (COUMADIN) 2 MG tablet Take 1 tablet (2 mg total) by mouth as directed. Take 2 mg on Monday- Saturday and 1 Mg on Sunday (Patient taking differently: Take 1-2 mg by mouth See admin instructions. Take 2 mg on Monday- Saturday and 1 Mg on Sunday)     No current facility-administered medications for this encounter.    Wt Readings from Last 3 Encounters:  11/25/18 72.5 kg (159 lb 12.8 oz)  11/20/18 73.5 kg (162 lb)  11/18/18 73 kg (161 lb)    BP 118/60   Pulse 76   Wt 72.5 kg (159 lb 12.8 oz)   SpO2 96%   BMI 25.41 kg/m  General: Appears chronically ill. No resp difficulty. Arrived in a wheel chair.  HEENT: normal Neck: supple. JVp 7-8. Carotids 2+ bilat; no bruits. No lymphadenopathy or thryomegaly appreciated. Cor: PMI nondisplaced. Irregular rate &  rhythm. No rubs, gallops or murmurs. Lungs: clear Abdomen: soft, nontender, nondistended. No hepatosplenomegaly. No bruits or masses. Good bowel sounds. Extremities: no cyanosis, clubbing, rash, R and LLE compression wraps. edema Neuro: alert & orientedx3, cranial nerves grossly intact. moves all 4 extremities w/o difficulty. Affect pleasant  Assessment/Plan: 1. Chronic diastolic CHF: Echo in 1/30 showed EF 60-65%, mild RV dilation with normal systolic function, PASP 93 mmHg. She had remote nonischemic cardiomyopathy with recovery of EF, has Medtronic ICD. I suspect that there is significant RV dysfunction. Atrial fibrillation likely contributes, but this appears to be permanent now as she has failed multiple anti-arrhythmics and is reasonably rate-controlled. [place] 11/09/18 showed mild-moderately elevated PCWP and RA pressure, primarily pulmonary venous hypertension.  Prominent v-waves on PCWP tracing may be due to stiff ventricle/diastolic dysfunction as she had only mild MR on recent echo.   - NYHA IIIb. Volume status stable. Continue torsemide at 80 mg/40 mg .  Continue torsemide  80 qam/40 qpm.   - Continue  spironolactone to 25 mg daily.  - Consent for cardiomems. We discussed and she is interested and would like to pursue.  - Obtain consent today and once approved will arrange for implant.  2. Pulmonary hypertension: PA systolic pressure estimated 93 mmHg on echo. Based on [place] 11/09/18, she has primarily pulmonary venous hypertension.  3. CKD: Stage 3. Check BMET today.  4. Anemia: Fe deficiency. She denies overt bleeding. She is Fe deficient. FOBT was negative during 7/20 admission, she got IV Fe.  -She has been referred to hematology for evaluation.  5. Atrial fibrillation: Permanent now.  Rate controlled. She has failed amiodarone, Tikosyn, and flecainide as well as multiple cardioversions.  - Continue warfarin.   Greater than 50% of the (total minutes 25) visit  spent in  counseling/coordination of care regarding the above. Discussed cardiomems purpose and provide education booklet today.   Tiki Tucciarone  NP-C 11/25/2018

## 2018-11-25 ENCOUNTER — Ambulatory Visit (HOSPITAL_COMMUNITY)
Admission: RE | Admit: 2018-11-25 | Discharge: 2018-11-25 | Disposition: A | Discharge status: Home / Self Care | Payer: PPO | Source: Ambulatory Visit | Attending: Internal Medicine | Admitting: Internal Medicine

## 2018-11-25 ENCOUNTER — Telehealth: Payer: Self-pay

## 2018-11-25 ENCOUNTER — Encounter (HOSPITAL_COMMUNITY): Payer: Self-pay

## 2018-11-25 ENCOUNTER — Other Ambulatory Visit: Payer: Self-pay

## 2018-11-25 VITALS — BP 118/60 | HR 76 | Wt 159.8 lb

## 2018-11-25 DIAGNOSIS — Z87891 Personal history of nicotine dependence: Secondary | ICD-10-CM | POA: Diagnosis not present

## 2018-11-25 DIAGNOSIS — Z79899 Other long term (current) drug therapy: Secondary | ICD-10-CM | POA: Diagnosis not present

## 2018-11-25 DIAGNOSIS — I130 Hypertensive heart and chronic kidney disease with heart failure and stage 1 through stage 4 chronic kidney disease, or unspecified chronic kidney disease: Secondary | ICD-10-CM | POA: Diagnosis not present

## 2018-11-25 DIAGNOSIS — Z905 Acquired absence of kidney: Secondary | ICD-10-CM | POA: Diagnosis not present

## 2018-11-25 DIAGNOSIS — I6522 Occlusion and stenosis of left carotid artery: Secondary | ICD-10-CM | POA: Diagnosis not present

## 2018-11-25 DIAGNOSIS — Z95810 Presence of automatic (implantable) cardiac defibrillator: Secondary | ICD-10-CM | POA: Diagnosis not present

## 2018-11-25 DIAGNOSIS — Z7901 Long term (current) use of anticoagulants: Secondary | ICD-10-CM | POA: Diagnosis not present

## 2018-11-25 DIAGNOSIS — I5032 Chronic diastolic (congestive) heart failure: Secondary | ICD-10-CM | POA: Diagnosis not present

## 2018-11-25 DIAGNOSIS — N184 Chronic kidney disease, stage 4 (severe): Secondary | ICD-10-CM

## 2018-11-25 DIAGNOSIS — Z8249 Family history of ischemic heart disease and other diseases of the circulatory system: Secondary | ICD-10-CM | POA: Diagnosis not present

## 2018-11-25 DIAGNOSIS — I272 Pulmonary hypertension, unspecified: Secondary | ICD-10-CM | POA: Diagnosis not present

## 2018-11-25 DIAGNOSIS — Z803 Family history of malignant neoplasm of breast: Secondary | ICD-10-CM | POA: Insufficient documentation

## 2018-11-25 DIAGNOSIS — J449 Chronic obstructive pulmonary disease, unspecified: Secondary | ICD-10-CM | POA: Insufficient documentation

## 2018-11-25 DIAGNOSIS — I428 Other cardiomyopathies: Secondary | ICD-10-CM | POA: Diagnosis not present

## 2018-11-25 DIAGNOSIS — I2510 Atherosclerotic heart disease of native coronary artery without angina pectoris: Secondary | ICD-10-CM | POA: Diagnosis not present

## 2018-11-25 DIAGNOSIS — N183 Chronic kidney disease, stage 3 (moderate): Secondary | ICD-10-CM | POA: Insufficient documentation

## 2018-11-25 DIAGNOSIS — Z85528 Personal history of other malignant neoplasm of kidney: Secondary | ICD-10-CM | POA: Insufficient documentation

## 2018-11-25 DIAGNOSIS — E785 Hyperlipidemia, unspecified: Secondary | ICD-10-CM | POA: Insufficient documentation

## 2018-11-25 DIAGNOSIS — D509 Iron deficiency anemia, unspecified: Secondary | ICD-10-CM | POA: Insufficient documentation

## 2018-11-25 DIAGNOSIS — Z79890 Hormone replacement therapy (postmenopausal): Secondary | ICD-10-CM | POA: Insufficient documentation

## 2018-11-25 DIAGNOSIS — I4821 Permanent atrial fibrillation: Secondary | ICD-10-CM

## 2018-11-25 LAB — BASIC METABOLIC PANEL
Anion gap: 9 (ref 5–15)
BUN: 20 mg/dL (ref 8–23)
CO2: 29 mmol/L (ref 22–32)
Calcium: 9 mg/dL (ref 8.9–10.3)
Chloride: 101 mmol/L (ref 98–111)
Creatinine, Ser: 1.45 mg/dL — ABNORMAL HIGH (ref 0.44–1.00)
GFR calc Af Amer: 39 mL/min — ABNORMAL LOW (ref >60)
GFR calc non Af Amer: 33 mL/min — ABNORMAL LOW (ref >60)
Glucose, Bld: 118 mg/dL — ABNORMAL HIGH (ref 70–99)
Potassium: 4.3 mmol/L (ref 3.5–5.1)
Sodium: 139 mmol/L (ref 135–145)

## 2018-11-25 NOTE — Telephone Encounter (Signed)
Hinton Dyer, home health nurse, says that patient refused to have her come out today because she has a conflicting appointment with the cardiologist and will not be home.   Called patient and spoke with granddaughter (okay per patient) and gave instructions for patient to take 1 pill (2mg ) of her coumadin tonight and I would have the home health nurse come out tomorrow (Thursday 7/23) and check her INR level.  I will call her after I receive the level and give her further instructions at that time.   Granddaughter verbalizes understanding.   Also, LM on [facility] nurse) voicemail with instructions to please go out tomorrow (Thursday 7/23) to obtain INR and call with result by end of day.  Okay to order STAT if need be, I just need result by end of day for dosing plans.    Thanks.

## 2018-11-25 NOTE — Patient Instructions (Signed)
Labs were done today. We will call you with any ABNORMAL results. No news is good news!  You have signed consent forms for Cardiomems, we will get this process going for you.  Your physician recommends that you schedule a follow-up appointment in: 6 weeks.  At the [facility], you and your health needs are our priority. As part of our continuing mission to provide you with exceptional heart care, we have created designated Provider Care Teams. These Care Teams include your primary Cardiologist (physician) and Advanced Practice Providers (APPs- Physician Assistants and Nurse Practitioners) who all work together to provide you with the care you need, when you need it.   You may see any of the following providers on your designated Care Team at your next follow up: Marland Kitchen Dr Glori Bickers . Dr Loralie Champagne . Darrick Grinder, NP

## 2018-11-26 ENCOUNTER — Ambulatory Visit (INDEPENDENT_AMBULATORY_CARE_PROVIDER_SITE_OTHER): Payer: PPO

## 2018-11-26 DIAGNOSIS — Z7901 Long term (current) use of anticoagulants: Secondary | ICD-10-CM | POA: Diagnosis not present

## 2018-11-26 DIAGNOSIS — Z01810 Encounter for preprocedural cardiovascular examination: Secondary | ICD-10-CM

## 2018-11-26 LAB — POCT INR: INR: 1.6 — AB (ref 2.0–3.0)

## 2018-11-26 NOTE — Patient Instructions (Signed)
INR today 1.6  (denies any changes or missed doses)  Patient is to take 3mg  (1.5pills) today 7/23 and tomorrow 7/24 and then take increased dosing of 2mg  daily EXCEPT for 3mg  on Wednesdays only and recheck in 1 week.  Spoke with patient who verbalizes understanding of all instructions and left detailed message for Hinton Dyer with [facility] with info and to recheck on 7/30 in 1 week.   Patient is stable at this point and we will continue to monitor closely.

## 2018-11-27 ENCOUNTER — Telehealth: Payer: Self-pay | Admitting: Hematology

## 2018-11-27 NOTE — Telephone Encounter (Signed)
Confirmed with patient 01/25/19 new patient visit with Dr. Irene Limbo at 1 pm. Date/time per patient. Per patient she does not want to come right now and wants to put off/out until September.  Referral message to referring re patient request for September appointment. Patient aware.

## 2018-11-30 NOTE — Telephone Encounter (Signed)
Discussed testing with patient at recent office visit, not sure of validity with chronic leg edema or utility given her other problems. Will hold off on this for now.

## 2018-12-01 ENCOUNTER — Telehealth: Payer: Self-pay

## 2018-12-01 NOTE — Telephone Encounter (Signed)
Spoke with patient to remind of missed remote transmission 

## 2018-12-02 ENCOUNTER — Telehealth: Payer: Self-pay

## 2018-12-02 NOTE — Telephone Encounter (Signed)
Patient and I discussed last week getting her set up with a home meter for home monitoring through my coumadin clinic.  Patient is very interested due to difficultygetting in and out of the office right now.   I spoke with Elta Guadeloupe ([employer] rep for meter) that I work with, and he is generating paperwork process so we can verify her benefits and look at getting this set up for her in the home.   FYI to Tor Netters, NP PCP.

## 2018-12-02 NOTE — Telephone Encounter (Signed)
Noted  

## 2018-12-02 NOTE — Telephone Encounter (Signed)
Noted, [name] out of the office this week, this seems very reasonable.

## 2018-12-03 ENCOUNTER — Ambulatory Visit (INDEPENDENT_AMBULATORY_CARE_PROVIDER_SITE_OTHER): Payer: PPO

## 2018-12-03 DIAGNOSIS — Z7901 Long term (current) use of anticoagulants: Secondary | ICD-10-CM

## 2018-12-03 DIAGNOSIS — Z01810 Encounter for preprocedural cardiovascular examination: Secondary | ICD-10-CM

## 2018-12-03 LAB — POCT INR: INR: 2.3 (ref 2.0–3.0)

## 2018-12-03 NOTE — Patient Instructions (Signed)
INR today 2.3  Patient is to continue taking of 2mg  daily EXCEPT for 3mg  on Wednesdays only and recheck in 1 week.  Spoke with patient who verbalizes understanding of all instructions and left detailed message for [name] with [facility] with info and to recheck on 8/6 in 1 week.   Patient is stable at this point and we will continue to monitor closely.

## 2018-12-04 NOTE — Progress Notes (Signed)
I have reviewed this visit and I agree on the patient's plan of dosage and recommendations. Kaydon Creedon B Dasia Guerrier, FNP   

## 2018-12-04 NOTE — Progress Notes (Signed)
I have reviewed this visit and I agree on the patient's plan of dosage and recommendations. Deborah B Gessner, FNP   

## 2018-12-04 NOTE — Progress Notes (Signed)
I have reviewed this visit and I agree on the patient's plan of dosage and recommendations. Anglea Gordner B Josuel Koeppen, FNP   

## 2018-12-04 NOTE — Progress Notes (Signed)
No ICM remote transmission received for 11/30/2018 and next ICM transmission scheduled for 12/21/2018.

## 2018-12-04 NOTE — Progress Notes (Signed)
I have reviewed this visit and I agree on the patient's plan of dosage and recommendations. Rein Popov B Nattaly Yebra, FNP   

## 2018-12-09 DIAGNOSIS — Z79891 Long term (current) use of opiate analgesic: Secondary | ICD-10-CM | POA: Diagnosis not present

## 2018-12-09 DIAGNOSIS — Z8673 Personal history of transient ischemic attack (TIA), and cerebral infarction without residual deficits: Secondary | ICD-10-CM | POA: Diagnosis not present

## 2018-12-09 DIAGNOSIS — I2510 Atherosclerotic heart disease of native coronary artery without angina pectoris: Secondary | ICD-10-CM | POA: Diagnosis not present

## 2018-12-09 DIAGNOSIS — I5032 Chronic diastolic (congestive) heart failure: Secondary | ICD-10-CM | POA: Diagnosis not present

## 2018-12-09 DIAGNOSIS — Z96643 Presence of artificial hip joint, bilateral: Secondary | ICD-10-CM | POA: Diagnosis not present

## 2018-12-09 DIAGNOSIS — M1990 Unspecified osteoarthritis, unspecified site: Secondary | ICD-10-CM | POA: Diagnosis not present

## 2018-12-09 DIAGNOSIS — Z9181 History of falling: Secondary | ICD-10-CM | POA: Diagnosis not present

## 2018-12-09 DIAGNOSIS — D631 Anemia in chronic kidney disease: Secondary | ICD-10-CM | POA: Diagnosis not present

## 2018-12-09 DIAGNOSIS — Z950 Presence of cardiac pacemaker: Secondary | ICD-10-CM | POA: Diagnosis not present

## 2018-12-09 DIAGNOSIS — I4891 Unspecified atrial fibrillation: Secondary | ICD-10-CM | POA: Diagnosis not present

## 2018-12-09 DIAGNOSIS — Z90710 Acquired absence of both cervix and uterus: Secondary | ICD-10-CM | POA: Diagnosis not present

## 2018-12-09 DIAGNOSIS — S72132D Displaced apophyseal fracture of left femur, subsequent encounter for closed fracture with routine healing: Secondary | ICD-10-CM | POA: Diagnosis not present

## 2018-12-09 DIAGNOSIS — N189 Chronic kidney disease, unspecified: Secondary | ICD-10-CM | POA: Diagnosis not present

## 2018-12-09 DIAGNOSIS — Z7901 Long term (current) use of anticoagulants: Secondary | ICD-10-CM | POA: Diagnosis not present

## 2018-12-09 DIAGNOSIS — I130 Hypertensive heart and chronic kidney disease with heart failure and stage 1 through stage 4 chronic kidney disease, or unspecified chronic kidney disease: Secondary | ICD-10-CM | POA: Diagnosis not present

## 2018-12-09 DIAGNOSIS — Z85528 Personal history of other malignant neoplasm of kidney: Secondary | ICD-10-CM | POA: Diagnosis not present

## 2018-12-09 DIAGNOSIS — I959 Hypotension, unspecified: Secondary | ICD-10-CM | POA: Diagnosis not present

## 2018-12-09 DIAGNOSIS — I10 Essential (primary) hypertension: Secondary | ICD-10-CM | POA: Diagnosis not present

## 2018-12-11 ENCOUNTER — Ambulatory Visit (INDEPENDENT_AMBULATORY_CARE_PROVIDER_SITE_OTHER): Payer: PPO

## 2018-12-11 ENCOUNTER — Other Ambulatory Visit: Payer: Self-pay | Admitting: Internal Medicine

## 2018-12-11 DIAGNOSIS — Z01810 Encounter for preprocedural cardiovascular examination: Secondary | ICD-10-CM

## 2018-12-11 DIAGNOSIS — Z7901 Long term (current) use of anticoagulants: Secondary | ICD-10-CM

## 2018-12-11 LAB — POCT INR: INR: 2.4 (ref 2.0–3.0)

## 2018-12-11 NOTE — Patient Instructions (Signed)
NR today 2.4  Patient is to continue taking of 2mg  daily EXCEPT for 3mg  on Wednesdays only and recheck in 2 weeks.  Spoke with patient who verbalizes understanding of all instructions and left detailed message for [name] with [facility] with info and to recheck on 8/20 in 2weeks.   Patient is stable at this point and we will continue to monitor closely.

## 2018-12-14 NOTE — Progress Notes (Signed)
I have reviewed this visit and I agree on the patient's plan of dosage and recommendations. Deborah B Gessner, FNP   

## 2018-12-21 ENCOUNTER — Encounter: Payer: PPO | Admitting: *Deleted

## 2018-12-21 DIAGNOSIS — S72032D Displaced midcervical fracture of left femur, subsequent encounter for closed fracture with routine healing: Secondary | ICD-10-CM | POA: Diagnosis not present

## 2018-12-21 DIAGNOSIS — I83009 Varicose veins of unspecified lower extremity with ulcer of unspecified site: Secondary | ICD-10-CM | POA: Diagnosis not present

## 2018-12-22 ENCOUNTER — Telehealth: Payer: Self-pay

## 2018-12-22 NOTE — Telephone Encounter (Signed)
Left message for patient to remind of missed remote transmission.  

## 2018-12-25 ENCOUNTER — Ambulatory Visit (INDEPENDENT_AMBULATORY_CARE_PROVIDER_SITE_OTHER): Payer: PPO

## 2018-12-25 DIAGNOSIS — Z7901 Long term (current) use of anticoagulants: Secondary | ICD-10-CM | POA: Diagnosis not present

## 2018-12-25 DIAGNOSIS — Z85528 Personal history of other malignant neoplasm of kidney: Secondary | ICD-10-CM | POA: Diagnosis not present

## 2018-12-25 DIAGNOSIS — Z01810 Encounter for preprocedural cardiovascular examination: Secondary | ICD-10-CM

## 2018-12-25 DIAGNOSIS — I130 Hypertensive heart and chronic kidney disease with heart failure and stage 1 through stage 4 chronic kidney disease, or unspecified chronic kidney disease: Secondary | ICD-10-CM | POA: Diagnosis not present

## 2018-12-25 DIAGNOSIS — Z90710 Acquired absence of both cervix and uterus: Secondary | ICD-10-CM | POA: Diagnosis not present

## 2018-12-25 DIAGNOSIS — S72132D Displaced apophyseal fracture of left femur, subsequent encounter for closed fracture with routine healing: Secondary | ICD-10-CM | POA: Diagnosis not present

## 2018-12-25 DIAGNOSIS — I2510 Atherosclerotic heart disease of native coronary artery without angina pectoris: Secondary | ICD-10-CM | POA: Diagnosis not present

## 2018-12-25 DIAGNOSIS — Z950 Presence of cardiac pacemaker: Secondary | ICD-10-CM | POA: Diagnosis not present

## 2018-12-25 DIAGNOSIS — D631 Anemia in chronic kidney disease: Secondary | ICD-10-CM | POA: Diagnosis not present

## 2018-12-25 DIAGNOSIS — N189 Chronic kidney disease, unspecified: Secondary | ICD-10-CM | POA: Diagnosis not present

## 2018-12-25 DIAGNOSIS — Z9181 History of falling: Secondary | ICD-10-CM | POA: Diagnosis not present

## 2018-12-25 DIAGNOSIS — Z79891 Long term (current) use of opiate analgesic: Secondary | ICD-10-CM | POA: Diagnosis not present

## 2018-12-25 DIAGNOSIS — I959 Hypotension, unspecified: Secondary | ICD-10-CM | POA: Diagnosis not present

## 2018-12-25 DIAGNOSIS — Z8673 Personal history of transient ischemic attack (TIA), and cerebral infarction without residual deficits: Secondary | ICD-10-CM | POA: Diagnosis not present

## 2018-12-25 DIAGNOSIS — Z96643 Presence of artificial hip joint, bilateral: Secondary | ICD-10-CM | POA: Diagnosis not present

## 2018-12-25 DIAGNOSIS — I5032 Chronic diastolic (congestive) heart failure: Secondary | ICD-10-CM | POA: Diagnosis not present

## 2018-12-25 DIAGNOSIS — I10 Essential (primary) hypertension: Secondary | ICD-10-CM | POA: Diagnosis not present

## 2018-12-25 DIAGNOSIS — I4891 Unspecified atrial fibrillation: Secondary | ICD-10-CM | POA: Diagnosis not present

## 2018-12-25 DIAGNOSIS — M1990 Unspecified osteoarthritis, unspecified site: Secondary | ICD-10-CM | POA: Diagnosis not present

## 2018-12-25 LAB — POCT INR: INR: 2.7 (ref 2.0–3.0)

## 2018-12-25 NOTE — Patient Instructions (Signed)
INR today 2.7  Patient is to continue taking of 2mg  daily EXCEPT for 3mg  on Wednesdays only and recheck in 4 weeks.  Spoke with patient who verbalizes understanding of all instructions.

## 2018-12-25 NOTE — Progress Notes (Signed)
No ICM remote transmission received for 12/21/2018 and next ICM transmission scheduled for 02/08/2019.  Defib office check 12/29/2018 with Dr Caryl Comes

## 2018-12-28 ENCOUNTER — Telehealth (HOSPITAL_COMMUNITY): Payer: Self-pay | Admitting: *Deleted

## 2018-12-28 NOTE — Telephone Encounter (Signed)
Pt called to report a 6lb weight gain in a week and swelling in her legs. No other complaint. Pt stated she is taking all medication as prescribed.   Please advise  Routed to [place]

## 2018-12-28 NOTE — Telephone Encounter (Signed)
1. Increase torsemide to 80 bid x 4 days then back to 80 qam/40 qpm.   2. Need to make sure we are working on cardiomems for her (implant when approved).

## 2018-12-29 ENCOUNTER — Ambulatory Visit (INDEPENDENT_AMBULATORY_CARE_PROVIDER_SITE_OTHER): Payer: PPO | Admitting: Internal Medicine

## 2018-12-29 ENCOUNTER — Other Ambulatory Visit: Payer: Self-pay

## 2018-12-29 ENCOUNTER — Encounter: Payer: Self-pay | Admitting: Internal Medicine

## 2018-12-29 VITALS — BP 138/60 | HR 85 | Ht 65.0 in | Wt 161.0 lb

## 2018-12-29 DIAGNOSIS — I4821 Permanent atrial fibrillation: Secondary | ICD-10-CM

## 2018-12-29 DIAGNOSIS — Z95810 Presence of automatic (implantable) cardiac defibrillator: Secondary | ICD-10-CM

## 2018-12-29 DIAGNOSIS — I495 Sick sinus syndrome: Secondary | ICD-10-CM

## 2018-12-29 DIAGNOSIS — I428 Other cardiomyopathies: Secondary | ICD-10-CM | POA: Diagnosis not present

## 2018-12-29 DIAGNOSIS — I5032 Chronic diastolic (congestive) heart failure: Secondary | ICD-10-CM

## 2018-12-29 NOTE — Progress Notes (Signed)
Patient Care Team: Elby Beck, FNP as PCP - General (Nurse Practitioner) Deboraha Sprang, MD as PCP - Cardiology (Cardiology) Minna Merritts, MD as Consulting Physician (Cardiology) Deboraha Sprang, MD as Consulting Physician (Cardiology) Delana Meyer, Dolores Lory, MD as Consulting Physician (Vascular Surgery)   HPI  [name] is a 85 y.o. female Seen in followup for nonischemic cardiomyopathy complicated by acute/chronic heart failure and persistent atrial fibrillation.  She has a primary prevention ICD implanted 2006 with generator replacement x2 most recently 10/17  She has persistent  atrial fibrillation; previously treated with disopyramide changed to  flecainide. But 2/2 side effects  back on disopyramide. ( the d/c summary 4/16 was WRONG-describing the use of amiodarone)   when seen 9/19 with significant atrial fibrillation burden disopyramide was discontinued and amiodarone subsequently initiated; she has significant nausea prompting his discontinuation.  1/20  admitted for dofetilide initiation.  Converted spontaneously.  Maintained sinus rhythm.  Howeve unable to tolerate and was discontinued  AFib persistent now since 2/20   Atrial fibrillation has been associated with recurrent episodes of heart failure and diuresis is been challenged by her renal insufficiency    Antiarrhythmics Date Reason stopped  disopyramide 2019 ineffective  amiodarone 2019 nausea  dofetilide 2020 intolerant    Hospitalized 7/20 for heart failure.  Diuresed 22 pounds.  Also anemic.  Thought multifactorial.  Moderate pulmonary hypertension  Now multiple 7-8 pound weight gain with edema.  Leg wraps were discontinued because she developed ulcers on her lower extremities.  Quite painful.  Scheduled to see  [facility] in 2 weeks.     Cath 2006 without obstructive disease DATE TEST EF   2006 [facility] `` No Obstructive CAD  1/15 Echo  65 % Mod Pulm HTN, TR and Mild MR   4/16 Echo   65 %  Mod-sev PulmHTN with RVE, Mod MR severe TR  6/17 Echo  65% Mild-mod MR  12/18 Myoview 65% No ischemia  7/20 LHC  PA 64/18  PCWP 24 w Vwave 41  7/20 Echo  65% Pulm HTN    Anticoagulation with warfarin.          Date Cr K Hgb TSH LFTs  12/17 1.63 3.5 9.5    7/18 1.5 4.0 10.4    5/19 1.42 4.2 10.6    10/19 2.08 2.8 9.3 7.28   10/19 1.81 3.1 9.1    12/19 1.68 3.5 8.7    7/20 1.45 4.3 10.1 3.68      CHADS-VASc score of greater than or equal to 6 (age-65, gender-1, TIA-2 (4/16), heart failure-1)     She has a great grandson [name] to whom she is very attached.  He is a Furniture conservator/restorer and a cross-country running     Past Surgical History:  Procedure Laterality Date  . ABDOMINAL HYSTERECTOMY  1975   for benign causes  . APPENDECTOMY    . BI-VENTRICULAR PACEMAKER UPGRADE  05/04/2010  . CARDIAC CATHETERIZATION  2006  . CARDIOVERSION N/A 02/20/2018   Procedure: CARDIOVERSION;  Surgeon: Minna Merritts, MD;  Location: [facility];  Service: Cardiovascular;  Laterality: N/A;  . CARDIOVERSION N/A 03/27/2018   Procedure: CARDIOVERSION;  Surgeon: Minna Merritts, MD;  Location: [facility];  Service: Cardiovascular;  Laterality: N/A;  . CATARACT EXTRACTION W/ INTRAOCULAR LENS  IMPLANT, BILATERAL  01/2006-02-2006  . CHOLECYSTECTOMY N/A 11/11/2012   Procedure: LAPAROSCOPIC CHOLECYSTECTOMY WITH INTRAOPERATIVE CHOLANGIOGRAM;  Surgeon: Imogene Burn. Georgette Dover, MD;  Location: [place];  Service: General;  Laterality: N/A;  . EP IMPLANTABLE DEVICE N/A 02/21/2016   Procedure: ICD Generator Changeout;  Surgeon: Deboraha Sprang, MD;  Location: [place] [facility];  Service: Cardiovascular;  Laterality: N/A;  . INSERT / REPLACE / REMOVE PACEMAKER  05-01-11   02-28-05-/05-04-10-ICD-MEDTRONIC MAXIMAL DR  . JOINT REPLACEMENT    . LAPAROSCOPIC CHOLECYSTECTOMY  11/11/2012  . LAPAROSCOPIC LYSIS OF ADHESIONS N/A 11/11/2012   Procedure: LAPAROSCOPIC LYSIS OF ADHESIONS;  Surgeon: Imogene Burn. Georgette Dover, MD;  Location: [place];   Service: General;  Laterality: N/A;  . NEPHRECTOMY Right 06/2001    S/P RENAL CELL CANCER  . RIGHT HEART CATH N/A 11/09/2018   Procedure: RIGHT HEART CATH;  Surgeon: Larey Dresser, MD;  Location: [place] [facility];  Service: Cardiovascular;  Laterality: N/A;  . TOTAL HIP ARTHROPLASTY Right 05/03/2011   Procedure: TOTAL HIP ARTHROPLASTY ANTERIOR APPROACH;  Surgeon: Mcarthur Rossetti;  Location: [facility];  Service: Orthopedics;  Laterality: Right;  Removal of Cannulated Screws Right Hip, Right Direct Anterior Hip Replacement  . TOTAL HIP ARTHROPLASTY Left 10/09/2018   Procedure: TOTAL HIP ARTHROPLASTY ANTERIOR APPROACH;  Surgeon: Rod Can, MD;  Location: [place];  Service: Orthopedics;  Laterality: Left;    Current Outpatient Medications  Medication Sig Dispense Refill  . acetaminophen (TYLENOL) 325 MG tablet Take 2 tablets (650 mg total) by mouth every 6 (six) hours as needed for mild pain or moderate pain (or Fever >/= 101).    . Ascorbic Acid (VITAMIN C) 100 MG tablet Take 100 mg by mouth daily.    . bisacodyl (DULCOLAX) 5 MG EC tablet Take 1 tablet (5 mg total) by mouth daily as needed for moderate constipation. 10 tablet 0  . cetirizine (ZYRTEC) 10 MG tablet Take 10 mg by mouth daily.     Marland Kitchen conjugated estrogens (PREMARIN) vaginal cream Place 1 Applicatorful vaginally daily. (Patient taking differently: Place 1 Applicatorful vaginally daily as needed (vaginal dryness). ) 42.5 g 12  . CRANBERRY PO Take 4 tablets by mouth daily.    Marland Kitchen docusate sodium (COLACE) 100 MG capsule Take 1 capsule (100 mg total) by mouth 2 (two) times daily. (Patient taking differently: Take 100 mg by mouth 2 (two) times daily as needed. ) 10 capsule 0  . ezetimibe (ZETIA) 10 MG tablet Take 1 tablet (10 mg total) by mouth daily. 30 tablet 11  . feeding supplement, ENSURE ENLIVE, (ENSURE ENLIVE) LIQD Take 237 mLs by mouth 2 (two) times daily between meals. 237 mL 12  . ferrous sulfate 325 (65 FE) MG tablet Take 325  mg by mouth daily with breakfast.    . fluticasone (FLONASE) 50 MCG/ACT nasal spray Place 2 sprays into both nostrils daily. (Patient taking differently: Place 2 sprays into both nostrils daily as needed for allergies or rhinitis. ) 16 g 6  . hydrocortisone 2.5 % cream Apply 1 application topically daily as needed (itching).     Marland Kitchen levothyroxine (SYNTHROID) 25 MCG tablet Take 1 tablet (25 mcg total) by mouth daily before breakfast. 30 tablet 3  . magnesium oxide (MAG-OX) 400 MG tablet Take 1 tablet (400 mg total) by mouth daily. 30 tablet 2  . Multiple Vitamins-Iron (MULTIVITAMINS WITH IRON) TABS tablet Take 1 tablet by mouth daily.    . potassium chloride SA (K-DUR) 20 MEQ tablet Take 2 tablets (40 mEq total) by mouth every morning AND 1 tablet (20 mEq total) at bedtime. Take 1 tablet (20 meq) by mouth twice daily, except on  metolazone days take 2 tablets (40 meq) twice daily. 270 tablet 3  . rosuvastatin (CRESTOR) 40 MG tablet Take 1 tablet (40 mg total) by mouth daily. 90 tablet 3  . spironolactone (ALDACTONE) 25 MG tablet Take 1 tablet (25 mg total) by mouth daily. 90 tablet 3  . torsemide (DEMADEX) 20 MG tablet Take 4 tablets (80 mg total) by mouth every morning AND 2 tablets (40 mg total) at bedtime. 540 tablet 3  . warfarin (COUMADIN) 2 MG tablet Take 1 tablet (2 mg total) by mouth as directed. Take 2 mg on Monday- Saturday and 1 Mg on Sunday (Patient taking differently: Take 1-2 mg by mouth See admin instructions. Take 2 mg on Monday- Saturday and 1 Mg on Sunday)     No current facility-administered medications for this visit.     Allergies  Allergen Reactions  . Dilaudid [Hydromorphone] Other (See Comments)    Excessive Somnolence  Needing Narcan.  . Codeine Nausea And Vomiting  . Morphine And Related Nausea And Vomiting  . Sulfonamide Derivatives Other (See Comments)    Dry mouth    Review of Systems negative except from HPI and PMH  Physical Exam BP 138/60 (BP Location: Left  Arm, Patient Position: Sitting, Cuff Size: Normal)   Pulse 85   Ht 5\' 5"  (1.651 m)   Wt 161 lb (73 kg)   SpO2 96%   BMI 26.79 kg/m  Well developed and nourished in no acute distress HENT normal Neck supple with JVP->10 she is on 80 and 40 but I wonder whether one dose or something might help get some of that she got hypokalemic last fall with it carotids brisk and full without bruits Clear Regular rate and rhythm  2/6  Murmur  Abd-soft with active BS without hepatomegaly No Clubbing cyanosis 2+edema  Scabbed over ulcers on her left leg > R  Skin-warm and dry A & Oriented  Grossly normal sensory and motor function    ECG atrial fib with ventricular pacing   Assessment and  Plan  Nonischemic cardiomyopathy with interval normalization of LV function  Sinus node dysfunction device dependent   Mitral regurgitation  Pulmonary Hypertension  Complete heart block intermittent  Congestive heart failure acute chronic/diastolic class IIb-IIIa  Atrial fibrillation/flutter- permanent   Chronic Renal insufficiency grade 3    Implantable defibrillator    Recent hospitalization for diuresis.  Now has had a reaccumulation of about 7 pounds on the 22 that she is lost.  I discussed with Dr. [name] the alternative strategies.  He would like to increase her torsemide from 80/40--80/80.  His team will be seeing her next week.  There may still be a role for intermittent metolazone  Her atrial fibrillation is permanent.  Continue warfarin.  Rate response has been activated on her device and her V pacing percentage currently at 60% we would expect would diminish now that she is in atrial fibrillation permanently.

## 2018-12-29 NOTE — Telephone Encounter (Signed)
Called pt no answer will try again later.                                              

## 2018-12-29 NOTE — Patient Instructions (Signed)
Medication Instructions:  - Your physician has recommended you make the following change in your medication:   1) Increase torsemide 20 mg- take 4 tablets (80 mg) by mouth TWICE daily  If you need a refill on your cardiac medications before your next appointment, please call your pharmacy.   Lab work: - Your physician recommends that you have lab work today: BMP  If you have labs (blood work) drawn today and your tests are completely normal, you will receive your results only by: Marland Kitchen MyChart Message (if you have MyChart) OR . A paper copy in the mail If you have any lab test that is abnormal or we need to change your treatment, we will call you to review the results.  Testing/Procedures: - none ordered  Follow-Up: At [facility], you and your health needs are our priority.  As part of our continuing mission to provide you with exceptional heart care, we have created designated Provider Care Teams.  These Care Teams include your primary Cardiologist (physician) and Advanced Practice Providers (APPs -  Physician Assistants and Nurse Practitioners) who all work together to provide you with the care you need, when you need it.  You will need a follow up appointment in 6 months (February) with Dr. Caryl Comes. Marland Kitchen Please call our office 2 months in advance to schedule this appointment.  (Call in early December to schedule)  Any Other Special Instructions Will Be Listed Below (If Applicable). - N/A

## 2018-12-30 LAB — BASIC METABOLIC PANEL
BUN/Creatinine Ratio: 17 (ref 12–28)
BUN: 25 mg/dL (ref 8–27)
CO2: 26 mmol/L (ref 20–29)
Calcium: 9.5 mg/dL (ref 8.7–10.3)
Chloride: 97 mmol/L (ref 96–106)
Creatinine, Ser: 1.44 mg/dL — ABNORMAL HIGH (ref 0.57–1.00)
GFR calc Af Amer: 39 mL/min/{1.73_m2} — ABNORMAL LOW (ref >59)
GFR calc non Af Amer: 34 mL/min/{1.73_m2} — ABNORMAL LOW (ref >59)
Glucose: 89 mg/dL (ref 65–99)
Potassium: 5.1 mmol/L (ref 3.5–5.2)
Sodium: 139 mmol/L (ref 134–144)

## 2018-12-30 NOTE — Telephone Encounter (Signed)
Pt notified of med change yesterday at office visit with Dr.Klein

## 2018-12-31 NOTE — Progress Notes (Signed)
I have reviewed this visit and I agree on the patient's plan of dosage and recommendations. Naquita Nappier B Jonhatan Hearty, FNP   

## 2019-01-01 ENCOUNTER — Telehealth (HOSPITAL_COMMUNITY): Payer: Self-pay | Admitting: Cardiology

## 2019-01-01 NOTE — Telephone Encounter (Signed)
Approval for cardiomems received today Reached out to patient and informed her of approval, patient has an upcoming appointment 9/2 and would like to discuss scheduling at that time.

## 2019-01-06 ENCOUNTER — Ambulatory Visit (HOSPITAL_COMMUNITY)
Admission: RE | Admit: 2019-01-06 | Discharge: 2019-01-06 | Disposition: A | Discharge status: Home / Self Care | Payer: PPO | Source: Ambulatory Visit | Attending: Internal Medicine | Admitting: Internal Medicine

## 2019-01-06 ENCOUNTER — Other Ambulatory Visit: Payer: Self-pay

## 2019-01-06 ENCOUNTER — Encounter (HOSPITAL_COMMUNITY): Payer: Self-pay

## 2019-01-06 VITALS — BP 135/60 | HR 65 | Wt 165.6 lb

## 2019-01-06 DIAGNOSIS — Z803 Family history of malignant neoplasm of breast: Secondary | ICD-10-CM | POA: Insufficient documentation

## 2019-01-06 DIAGNOSIS — Z79899 Other long term (current) drug therapy: Secondary | ICD-10-CM | POA: Diagnosis not present

## 2019-01-06 DIAGNOSIS — Z95810 Presence of automatic (implantable) cardiac defibrillator: Secondary | ICD-10-CM | POA: Insufficient documentation

## 2019-01-06 DIAGNOSIS — N183 Chronic kidney disease, stage 3 (moderate): Secondary | ICD-10-CM | POA: Insufficient documentation

## 2019-01-06 DIAGNOSIS — Z905 Acquired absence of kidney: Secondary | ICD-10-CM | POA: Insufficient documentation

## 2019-01-06 DIAGNOSIS — Z882 Allergy status to sulfonamides status: Secondary | ICD-10-CM | POA: Diagnosis not present

## 2019-01-06 DIAGNOSIS — Z8249 Family history of ischemic heart disease and other diseases of the circulatory system: Secondary | ICD-10-CM | POA: Insufficient documentation

## 2019-01-06 DIAGNOSIS — I5033 Acute on chronic diastolic (congestive) heart failure: Secondary | ICD-10-CM

## 2019-01-06 DIAGNOSIS — I428 Other cardiomyopathies: Secondary | ICD-10-CM | POA: Diagnosis not present

## 2019-01-06 DIAGNOSIS — I6523 Occlusion and stenosis of bilateral carotid arteries: Secondary | ICD-10-CM | POA: Diagnosis not present

## 2019-01-06 DIAGNOSIS — D509 Iron deficiency anemia, unspecified: Secondary | ICD-10-CM | POA: Diagnosis not present

## 2019-01-06 DIAGNOSIS — I4821 Permanent atrial fibrillation: Secondary | ICD-10-CM | POA: Diagnosis not present

## 2019-01-06 DIAGNOSIS — I272 Pulmonary hypertension, unspecified: Secondary | ICD-10-CM

## 2019-01-06 DIAGNOSIS — Z87891 Personal history of nicotine dependence: Secondary | ICD-10-CM | POA: Diagnosis not present

## 2019-01-06 DIAGNOSIS — J449 Chronic obstructive pulmonary disease, unspecified: Secondary | ICD-10-CM | POA: Diagnosis not present

## 2019-01-06 DIAGNOSIS — I2510 Atherosclerotic heart disease of native coronary artery without angina pectoris: Secondary | ICD-10-CM | POA: Insufficient documentation

## 2019-01-06 DIAGNOSIS — I5042 Chronic combined systolic (congestive) and diastolic (congestive) heart failure: Secondary | ICD-10-CM | POA: Diagnosis not present

## 2019-01-06 DIAGNOSIS — Z96643 Presence of artificial hip joint, bilateral: Secondary | ICD-10-CM | POA: Diagnosis not present

## 2019-01-06 DIAGNOSIS — E7800 Pure hypercholesterolemia, unspecified: Secondary | ICD-10-CM | POA: Insufficient documentation

## 2019-01-06 DIAGNOSIS — I482 Chronic atrial fibrillation, unspecified: Secondary | ICD-10-CM | POA: Diagnosis not present

## 2019-01-06 DIAGNOSIS — Z9049 Acquired absence of other specified parts of digestive tract: Secondary | ICD-10-CM | POA: Diagnosis not present

## 2019-01-06 DIAGNOSIS — I130 Hypertensive heart and chronic kidney disease with heart failure and stage 1 through stage 4 chronic kidney disease, or unspecified chronic kidney disease: Secondary | ICD-10-CM | POA: Diagnosis not present

## 2019-01-06 DIAGNOSIS — Z7901 Long term (current) use of anticoagulants: Secondary | ICD-10-CM | POA: Diagnosis not present

## 2019-01-06 DIAGNOSIS — Z85528 Personal history of other malignant neoplasm of kidney: Secondary | ICD-10-CM | POA: Insufficient documentation

## 2019-01-06 DIAGNOSIS — Z885 Allergy status to narcotic agent status: Secondary | ICD-10-CM | POA: Diagnosis not present

## 2019-01-06 DIAGNOSIS — Z79890 Hormone replacement therapy (postmenopausal): Secondary | ICD-10-CM | POA: Diagnosis not present

## 2019-01-06 LAB — BASIC METABOLIC PANEL
Anion gap: 13 (ref 5–15)
BUN: 29 mg/dL — ABNORMAL HIGH (ref 8–23)
CO2: 26 mmol/L (ref 22–32)
Calcium: 9 mg/dL (ref 8.9–10.3)
Chloride: 98 mmol/L (ref 98–111)
Creatinine, Ser: 1.32 mg/dL — ABNORMAL HIGH (ref 0.44–1.00)
GFR calc Af Amer: 43 mL/min — ABNORMAL LOW (ref >60)
GFR calc non Af Amer: 37 mL/min — ABNORMAL LOW (ref >60)
Glucose, Bld: 86 mg/dL (ref 70–99)
Potassium: 3.8 mmol/L (ref 3.5–5.1)
Sodium: 137 mmol/L (ref 135–145)

## 2019-01-06 LAB — CBC
HCT: 31.1 % — ABNORMAL LOW (ref 36.0–46.0)
Hemoglobin: 9 g/dL — ABNORMAL LOW (ref 12.0–15.0)
MCH: 24.9 pg — ABNORMAL LOW (ref 26.0–34.0)
MCHC: 28.9 g/dL — ABNORMAL LOW (ref 30.0–36.0)
MCV: 86.1 fL (ref 80.0–100.0)
Platelets: 241 10*3/uL (ref 150–400)
RBC: 3.61 MIL/uL — ABNORMAL LOW (ref 3.87–5.11)
RDW: 19.4 % — ABNORMAL HIGH (ref 11.5–15.5)
WBC: 4 10*3/uL (ref 4.0–10.5)
nRBC: 0 % (ref 0.0–0.2)

## 2019-01-06 NOTE — Progress Notes (Signed)
01/06/2019 [name]   11/02/1933  [id number]  Primary Physician Elby Beck, [place] Primary Cardiologist: Virl Axe, MD  Electrophysiologist: None   Reason for Visit/CC: f/u for chronic diastolic HF and increased LEE  HPI: 85 y/o female, followed by Dr. [name]. Patient has history of permanent atrial fibrillation, chronic diastolic CHF, CKD stage 3, smoking/prior COPD, renal cell CA s/p right nephrectomy. She has a prior history of HFrEF with nonischemic cardiomyoapthy and had a Medtronic ICD placed.  However, subsequently her EF has recovered.  More recently, she has had symptomatic HFpEF.    She has had dyspnea and edema for "a long time," but it has been worse since January of this year. In 6/20, she had left hip ORIF after mechanical fall. Echo in 6/20 showed EF 60-65%, mild RV dilation with normal systolic function, PASP 93 mmHg.After she got home from the hospital stay post-ORIF, she noted marked peripheral edema. She was short of breath walking short distances. +orthopnea. She came back to the hospital on 11/04/18 and was admitted with volume overload.  She was diuresed aggressively and lost about 22 lbs.  RHC showed pulmonary venous hypertension.  PCWP tracing had prominent v-waves in absence of significant MR, suggesting significant diastolic dysfunction.   She has been noted to have significant Fe deficiency anemia, but FOBT was negative during 7/20 hospitalization.   Last clinic appt was 11/25/18 w/ [name] Grinder NP. Overall was feeling much better. Mild SOB with exertion. Denied PND/Orthopnea. No bleeding issues.  Limited by hip pain.  Appetite was ok.  No fever or chills. Weight at home stable 158 -159  pounds. She had reported full med compliance. Meds were continued w/o change. Cardiomems was discussed and pt consented and approved but not yet arranged.   She is here with her daughter today. Her weight is up since last OV. Increase from 159 lb to 165 lb. Home  weights also increased ~10 lb in the last week. BP 135/60. Taking diuretics as directed but poor UOP. She started to notice a change about 1 month ago, which correlates w/ the time she removed her leg wraps for compression to help w/ edema. She has had weeping edema, with crusting of blisters. Legs are sore and swollen. Denies fever and chills.  Has not had leg wrapping in over 3 weeks. She has chronic dyspnea at baseline. Uses supplemental O2. No increased O2 requirements. She has also been a bit more tired. Worried about her anemia. No recent CBC. Remains on coumadin. Denies abnormal bleeding. Some balance issues. 2 falls in the last 6 months. Did not hit her head.   She is still considering Cardiomems but would like to get her legs better first.  She is considering waiting until October to do this.    Current Meds  Medication Sig  . acetaminophen (TYLENOL) 325 MG tablet Take 2 tablets (650 mg total) by mouth every 6 (six) hours as needed for mild pain or moderate pain (or Fever >/= 101).  . Ascorbic Acid (VITAMIN C) 100 MG tablet Take 100 mg by mouth daily.  . bisacodyl (DULCOLAX) 5 MG EC tablet Take 1 tablet (5 mg total) by mouth daily as needed for moderate constipation.  . cetirizine (ZYRTEC) 10 MG tablet Take 10 mg by mouth daily.   Marland Kitchen conjugated estrogens (PREMARIN) vaginal cream Place 1 Applicatorful vaginally daily. (Patient taking differently: Place 1 Applicatorful vaginally daily as needed (vaginal dryness). )  . CRANBERRY PO Take 4 tablets by mouth  daily.  . docusate sodium (COLACE) 100 MG capsule Take 1 capsule (100 mg total) by mouth 2 (two) times daily. (Patient taking differently: Take 100 mg by mouth 2 (two) times daily as needed. )  . ezetimibe (ZETIA) 10 MG tablet Take 1 tablet (10 mg total) by mouth daily.  . ferrous sulfate 325 (65 FE) MG tablet Take 325 mg by mouth daily with breakfast.  . fluticasone (FLONASE) 50 MCG/ACT nasal spray Place 2 sprays into both nostrils daily.  (Patient taking differently: Place 2 sprays into both nostrils daily as needed for allergies or rhinitis. )  . hydrocortisone 2.5 % cream Apply 1 application topically daily as needed (itching).   Marland Kitchen levothyroxine (SYNTHROID) 25 MCG tablet Take 1 tablet (25 mcg total) by mouth daily before breakfast.  . magnesium oxide (MAG-OX) 400 MG tablet Take 1 tablet (400 mg total) by mouth daily.  . pediatric multivitamin-iron (POLY-VI-SOL WITH IRON) 15 MG chewable tablet Chew 1 tablet by mouth daily.  . potassium chloride SA (K-DUR) 20 MEQ tablet Take 2 tablets (40 mEq total) by mouth every morning AND 1 tablet (20 mEq total) at bedtime. Take 1 tablet (20 meq) by mouth twice daily, except on metolazone days take 2 tablets (40 meq) twice daily.  . rosuvastatin (CRESTOR) 40 MG tablet Take 1 tablet (40 mg total) by mouth daily.  Marland Kitchen spironolactone (ALDACTONE) 25 MG tablet Take 1 tablet (25 mg total) by mouth daily.  Marland Kitchen torsemide (DEMADEX) 20 MG tablet Take 4 tablets (80 mg total) by mouth every morning AND 2 tablets (40 mg total) at bedtime.  Marland Kitchen warfarin (COUMADIN) 2 MG tablet Take 1 tablet (2 mg total) by mouth as directed. Take 2 mg on Monday- Saturday and 1 Mg on Sunday (Patient taking differently: Take 1-2 mg by mouth See admin instructions. Take 2 mg on Monday- Saturday and 1 Mg on Sunday)  . [DISCONTINUED] Multiple Vitamins-Iron (MULTIVITAMINS WITH IRON) TABS tablet Take 1 tablet by mouth daily.   Allergies  Allergen Reactions  . Dilaudid [Hydromorphone] Other (See Comments)    Excessive Somnolence  Needing Narcan.  . Codeine Nausea And Vomiting  . Morphine And Related Nausea And Vomiting  . Sulfonamide Derivatives Other (See Comments)    Dry mouth   Past Medical History:  Diagnosis Date  . Adjustment disorder with anxiety   . Anginal pain ([place])   . Arthritis    "some in my hands" (11/11/2012)  . Atrial fibrillation ([place])   . Automatic implantable cardioverter-defibrillator in situ   . Blood in stool    . Carotid artery stenosis 09/2007   a. 09/2007: 60-79% bilateral (stable); b. 10/2008: 40-59% R 60-79%   . Chest pain, unspecified   . Chronic airway obstruction, not elsewhere classified   . Chronic bronchitis ([place])    "get it some; not q year" (11/11/2012)  . Chronic diastolic CHF (congestive heart failure) ([place]) 06/04/2013  . Chronic kidney disease, unspecified   . Coronary artery disease    non-obstructive by 2006 cath  . Frequent UTI    "get them a couple times/yr" (11/11/2012)  . Hemorrhage of rectum and anus   . High cholesterol   . History of blood transfusion 04/2011   "after hip OR" (11/11/2012)  . Hypertension 05/20/2011  . Hypertr obst cardiomyop   . Hypotension, unspecified    cardiac cath 2006..nonobstructive CAD 30-40s lesions.Marland KitchenETT 1/09 nondiagnostic due to poor HR response..Right Renal Cancer 2003  . Long term (current) use of anticoagulants   .  Malignant neoplasm of kidney, except pelvis   . Osteoarthritis of right hip   . Other and unspecified coagulation defects   . PONV (postoperative nausea and vomiting)   . Renal cancer ([place]) 06/2001   Right  . Secondary cardiomyopathy, unspecified   . Sinus bradycardia   . Urge incontinence    Family History  Problem Relation Age of Onset  . Heart failure Mother   . Breast cancer Maternal Aunt 82  . Breast cancer Cousin   . Breast cancer Other   . Colon cancer Neg Hx    Past Surgical History:  Procedure Laterality Date  . ABDOMINAL HYSTERECTOMY  1975   for benign causes  . APPENDECTOMY    . BI-VENTRICULAR PACEMAKER UPGRADE  05/04/2010  . CARDIAC CATHETERIZATION  2006  . CARDIOVERSION N/A 02/20/2018   Procedure: CARDIOVERSION;  Surgeon: Minna Merritts, MD;  Location: [facility];  Service: Cardiovascular;  Laterality: N/A;  . CARDIOVERSION N/A 03/27/2018   Procedure: CARDIOVERSION;  Surgeon: Minna Merritts, MD;  Location: [facility];  Service: Cardiovascular;  Laterality: N/A;  . CATARACT EXTRACTION W/ INTRAOCULAR LENS   IMPLANT, BILATERAL  01/2006-02-2006  . CHOLECYSTECTOMY N/A 11/11/2012   Procedure: LAPAROSCOPIC CHOLECYSTECTOMY WITH INTRAOPERATIVE CHOLANGIOGRAM;  Surgeon: Imogene Burn. Georgette Dover, MD;  Location: [facility];  Service: General;  Laterality: N/A;  . EP IMPLANTABLE DEVICE N/A 02/21/2016   Procedure: ICD Generator Changeout;  Surgeon: Deboraha Sprang, MD;  Location: [facility];  Service: Cardiovascular;  Laterality: N/A;  . INSERT / REPLACE / REMOVE PACEMAKER  05-01-11   02-28-05-/05-04-10-ICD-MEDTRONIC MAXIMAL DR  . JOINT REPLACEMENT    . LAPAROSCOPIC CHOLECYSTECTOMY  11/11/2012  . LAPAROSCOPIC LYSIS OF ADHESIONS N/A 11/11/2012   Procedure: LAPAROSCOPIC LYSIS OF ADHESIONS;  Surgeon: Imogene Burn. Georgette Dover, MD;  Location: [facility];  Service: General;  Laterality: N/A;  . NEPHRECTOMY Right 06/2001    S/P RENAL CELL CANCER  . RIGHT HEART CATH N/A 11/09/2018   Procedure: RIGHT HEART CATH;  Surgeon: Larey Dresser, MD;  Location: [facility];  Service: Cardiovascular;  Laterality: N/A;  . TOTAL HIP ARTHROPLASTY Right 05/03/2011   Procedure: TOTAL HIP ARTHROPLASTY ANTERIOR APPROACH;  Surgeon: Mcarthur Rossetti;  Location: [facility];  Service: Orthopedics;  Laterality: Right;  Removal of Cannulated Screws Right Hip, Right Direct Anterior Hip Replacement  . TOTAL HIP ARTHROPLASTY Left 10/09/2018   Procedure: TOTAL HIP ARTHROPLASTY ANTERIOR APPROACH;  Surgeon: Rod Can, MD;  Location: [facility];  Service: Orthopedics;  Laterality: Left;   Social History   Socioeconomic History  . Marital status: Widowed    Spouse name: Not on file  . Number of children: Not on file  . Years of education: Not on file  . Highest education level: Not on file  Occupational History  . Occupation: Retired    Fish farm manager: RETIRED  Social Needs  . Financial resource strain: Not on file  . Food insecurity    Worry: Not on file    Inability: Not on file  . Transportation needs    Medical: Not on file    Non-medical: Not on file   Tobacco Use  . Smoking status: Former Smoker    Packs/day: 0.50    Years: 40.00    Pack years: 20.00    Types: Cigarettes    Quit date: 05/06/2001    Years since quitting: 17.6  . Smokeless tobacco: Never Used  Substance and Sexual Activity  . Alcohol use: No    Alcohol/week: 0.0  standard drinks  . Drug use: No  . Sexual activity: Never  Lifestyle  . Physical activity    Days per week: Not on file    Minutes per session: Not on file  . Stress: Not on file  Relationships  . Social Herbalist on phone: Not on file    Gets together: Not on file    Attends religious service: Not on file    Active member of club or organization: Not on file    Attends meetings of clubs or organizations: Not on file    Relationship status: Not on file  . Intimate partner violence    Fear of current or ex partner: Not on file    Emotionally abused: Not on file    Physically abused: Not on file    Forced sexual activity: Not on file  Other Topics Concern  . 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   ALCOHOL USE-NO   RETIRED   FORMER TOBACCO USE.....1/2 PACK X 40 YRS UNTIL 2003   Would desire CPR     Lipid Panel     Component Value Date/Time   CHOL 179 03/19/2016 1123   CHOL 271 (H) 06/09/2014 1317   TRIG 110.0 03/19/2016 1123   TRIG 241 (H) 06/09/2014 1317   HDL 39.10 03/19/2016 1123   HDL 40 06/09/2014 1317   CHOLHDL 5 03/19/2016 1123   VLDL 22.0 03/19/2016 1123   VLDL 48 (H) 06/09/2014 1317   LDLCALC 118 (H) 03/19/2016 1123   LDLCALC 183 (H) 06/09/2014 1317   LDLDIRECT 146.0 11/07/2016 1128    Review of Systems: General: negative for chills, fever, night sweats or weight changes.  Cardiovascular: negative for chest pain, dyspnea on exertion, edema, orthopnea, palpitations, paroxysmal nocturnal dyspnea or  shortness of breath Dermatological: negative for rash Respiratory: negative for cough or wheezing Urologic: negative for hematuria Abdominal: negative for nausea, vomiting, diarrhea, bright red blood per rectum, melena, or hematemesis Neurologic: negative for visual changes, syncope, or dizziness All other systems reviewed and are otherwise negative except as noted above.   Physical Exam:  Blood pressure 135/60, pulse 65, weight 75.1 kg (165 lb 9.6 oz), SpO2 95 %.  General appearance: alert, cooperative, no distress and elderly WF Neck: no carotid bruit and no JVD Lungs: clear to auscultation bilaterally Heart: irregularly irregular rhythm and regular rate Abdomen: soft, non-tender; bowel sounds normal; no masses,  no organomegaly Extremities: varicose veins noted, venous stasis dermatitis noted and bilateral LEE, 2+, scabbed ulcers, no errythema  Pulses: 2+ and symmetric Skin: Skin color, texture, turgor normal. No rashes or lesions Neurologic: Grossly normal  EKG not performed -- personally reviewed   ASSESSMENT AND PLAN:   1. Chronic diastolic CHF: Echo in 3/76 showed EF 60-65%, mild RV dilation with normal systolic function, PASP 93 mmHg. She had remote nonischemic cardiomyopathy with recovery of EF, has Medtronic ICD. Suspect that there is significant RV dysfunction. Atrial fibrillation likely contributes, but this appears to be permanent now as she has failed multiple anti-arrhythmics and is reasonably rate-controlled. RHC7/6/20showed mild-moderately elevated PCWP and RA pressure, primarily pulmonary venous hypertension. Prominent v-waves on PCWP tracing may be due to stiff ventricle/diastolic dysfunction as she had only mild MR on recent echo.  - NYHA IIIb.  - Increased LEE  on exam, 2+ bilaterally w/ poor UOP despite recent diuretic increase and full compliance. Condition worsened after she removed long term leg wrappings for compression/edema. Suspect 3rd spacing. Needs  compression.  Will arrange H/H for wound care and leg wrapping. Elevate legs when able to help with edema. - Awaiting Optival Report - Continue torsemide  80 qam/40 qpm.   - Continue  spironolactone to 25 mg daily.  - Still debating cardiomems. She will let us know if she would like to pursue.   2. Pulmonary hypertension: PA systolic pressure estimated 93 mmHg on echo. Based on RHC7/6/20, she has primarily pulmonary venous hypertension.   3. CKD: Stage 3.Check BMET today.  4. Anemia: Fe deficiency. She denies overt bleeding. She is Fe deficient. FOBT was negative during 7/20 admission, she got IV Fe. More tired and concern. Will repeat CBC today. She has been referred to hematology for evaluation.   5. Atrial fibrillation: Permanent now.  Rate controlled. She has failed amiodarone, Tikosyn, and flecainide as well as multiple cardioversions.  -Continue warfarin.  - h/o falls, 2 in the last 6 months. If recurrent falls, will need to re weigh risk vs benefit - advised to seek medical attention if fall w/ head injury    Follow-Up w/ APP in 2 weeks   [name], MHS [facility] 01/06/2019 3:57 PM

## 2019-01-06 NOTE — Patient Instructions (Signed)
Routine lab work today. Will notify you of abnormal results  An order has been placed for home health for wound care/ leg wraps.  Follow up in 2 weeks.  Do the following things EVERYDAY: 1) Weigh yourself in the morning before breakfast. Write it down and keep it in a log. 2) Take your medicines as prescribed 3) Eat low salt foods-Limit salt (sodium) to 2000 mg per day.  4) Stay as active as you can everyday 5) Limit all fluids for the day to less than 2 liters

## 2019-01-13 ENCOUNTER — Other Ambulatory Visit: Payer: Self-pay

## 2019-01-13 ENCOUNTER — Telehealth: Payer: Self-pay | Admitting: Internal Medicine

## 2019-01-13 DIAGNOSIS — I83899 Varicose veins of unspecified lower extremities with other complications: Secondary | ICD-10-CM

## 2019-01-13 MED ORDER — DOXYCYCLINE HYCLATE 100 MG PO TBEC: 100.0000 mg | DELAYED_RELEASE_TABLET | Freq: Two times a day (BID) | ORAL | 0 refills | Status: DC

## 2019-01-13 MED ORDER — METOLAZONE 2.5 MG PO TABS: 2.5000 mg | ORAL_TABLET | ORAL | 3 refills | Status: DC

## 2019-01-13 MED ORDER — TORSEMIDE 20 MG PO TABS: 80.0000 mg | ORAL_TABLET | Freq: Two times a day (BID) | ORAL | 3 refills | Status: DC

## 2019-01-13 NOTE — Telephone Encounter (Signed)
DM patient

## 2019-01-13 NOTE — Telephone Encounter (Signed)
Spoke w/pt, she does have Metolazone at home and will take that tomorrow am, she is agreeable to increase torsemide as well, med list updated.  Pt also c/o wound on L lower leg, she states it is larger than a quarter has yellow drainage and a bad smell, she states [facility] never called her until yesterday and they advised they wouldn't be able to start her care for another week or 2 due to their volumes, she called the wound center herself and has an appt for Fri 9/11 at 8 am.  Discussed wound w/Amy Ninfa Meeker, NP she advises we go ahead and start pt on Doxy 100 mg BID until she can see the wound center.  Discussed that w/Lauren Hubbard Robinson D as pt is on Coumadin, she states ok to take pt should just INR check next week, will check at appt. Pt is aware and agreeable, she is already sch for f/u in our clinic on 9/16 and will keep that appt.

## 2019-01-13 NOTE — Telephone Encounter (Signed)
Give 1 dose of metolazone 2.5 mg daily with morning torsemide tomorrow.  Increase torsemide to 80 mg bid.  Needs to get home health out to wrap her legs asap.  Work in appt next week. BMET Monday or Tuesday.

## 2019-01-13 NOTE — Telephone Encounter (Signed)
New Message   Patient states that she was seen last week on  01/06/19 and as a follow up from that appointment, she was supposed to have a home health wound care nurse assigned to come out from [facility], but patient has not heard anything. Please assist.    Pt c/o swelling: STAT is pt has developed SOB within 24 hours  1) How much weight have you gained and in what time span? 4 lbs in a week  2) If swelling, where is the swelling located? Both legs, left leg weeping yellow fluid and has a really bad smell  3) Are you currently taking a fluid pill? Yes  4) Are you currently SOB? Yes, since last Wednesday  5) Do you have a log of your daily weights (if so, list)? No, not writing it down  6) Have you gained 3 pounds in a day or 5 pounds in a week? No  7) Have you traveled recently? No

## 2019-01-14 ENCOUNTER — Telehealth (HOSPITAL_COMMUNITY): Payer: Self-pay | Admitting: *Deleted

## 2019-01-14 NOTE — Telephone Encounter (Signed)
hey, the script you sent for [name] yesterday is not available at that pharmacy. you sent extended release doxy. they have immediate release. they want to know if they can give her the immediate release    Spoke w/pharmacy and gave ok to give immediate release.

## 2019-01-15 ENCOUNTER — Encounter (HOSPITAL_COMMUNITY): Payer: PPO

## 2019-01-15 ENCOUNTER — Other Ambulatory Visit: Payer: Self-pay

## 2019-01-15 ENCOUNTER — Encounter: Payer: PPO | Attending: Physician Assistant | Admitting: Physician Assistant

## 2019-01-15 ENCOUNTER — Encounter: Payer: PPO | Admitting: Vascular Surgery

## 2019-01-15 ENCOUNTER — Ambulatory Visit: Payer: PPO | Admitting: Physician Assistant

## 2019-01-15 DIAGNOSIS — J449 Chronic obstructive pulmonary disease, unspecified: Secondary | ICD-10-CM | POA: Insufficient documentation

## 2019-01-15 DIAGNOSIS — G629 Polyneuropathy, unspecified: Secondary | ICD-10-CM | POA: Diagnosis not present

## 2019-01-15 DIAGNOSIS — I2510 Atherosclerotic heart disease of native coronary artery without angina pectoris: Secondary | ICD-10-CM | POA: Diagnosis not present

## 2019-01-15 DIAGNOSIS — I482 Chronic atrial fibrillation, unspecified: Secondary | ICD-10-CM | POA: Diagnosis not present

## 2019-01-15 DIAGNOSIS — I739 Peripheral vascular disease, unspecified: Secondary | ICD-10-CM | POA: Diagnosis not present

## 2019-01-15 DIAGNOSIS — Z87891 Personal history of nicotine dependence: Secondary | ICD-10-CM | POA: Insufficient documentation

## 2019-01-15 DIAGNOSIS — I7389 Other specified peripheral vascular diseases: Secondary | ICD-10-CM | POA: Insufficient documentation

## 2019-01-15 DIAGNOSIS — I890 Lymphedema, not elsewhere classified: Secondary | ICD-10-CM | POA: Diagnosis not present

## 2019-01-15 DIAGNOSIS — I509 Heart failure, unspecified: Secondary | ICD-10-CM | POA: Insufficient documentation

## 2019-01-15 DIAGNOSIS — M25559 Pain in unspecified hip: Secondary | ICD-10-CM | POA: Diagnosis not present

## 2019-01-15 DIAGNOSIS — L97822 Non-pressure chronic ulcer of other part of left lower leg with fat layer exposed: Secondary | ICD-10-CM | POA: Diagnosis not present

## 2019-01-15 DIAGNOSIS — N186 End stage renal disease: Secondary | ICD-10-CM | POA: Diagnosis not present

## 2019-01-19 NOTE — Progress Notes (Signed)
LAURITA, PERON ([id number]) Visit Report for 01/15/2019 Chief Complaint Document Details Patient Name: [name], [name]. Date of Service: 01/15/2019 8:00 AM Medical Record Number: [id number] Patient Account Number: [id number] Date of Birth/Sex: [birth year] (84 y.o. F) Treating RN: Montey Hora Primary Care Provider: Clarene Reamer Other Clinician: Referring Provider: Referral, Self Treating Provider/Extender: Melburn Hake, HOYT Weeks in Treatment: 0 Information Obtained from: Patient Chief Complaint Left LE Ulcer Electronic Signature(s) Signed: 01/15/2019 9:00:10 AM By: Worthy Keeler PA-C Entered By: Worthy Keeler on 01/15/2019 09:00:09 [name] ([id number]) -------------------------------------------------------------------------------- Debridement Details Patient Name: [name]. Date of Service: 01/15/2019 8:00 AM Medical Record Number: [id number] Patient Account Number: [id number] Date of Birth/Sex: 01/23/1934 (84 y.o. F) Treating RN: Montey Hora Primary Care Provider: Clarene Reamer Other Clinician: Referring Provider: Referral, Self Treating Provider/Extender: STONE III, HOYT Weeks in Treatment: 0 Debridement Performed for Wound #1 Left,Lateral Lower Leg Assessment: Performed By: Physician STONE III, HOYT E., PA-C Debridement Type: Chemical/Enzymatic/Mechanical Agent Used: saline and gauze Level of Consciousness (Pre- Awake and Alert procedure): Pre-procedure Verification/Time Yes - 09:11 Out Taken: Start Time: 09:11 Pain Control: Lidocaine 4% Topical Solution Instrument: Other : saline and gauze Bleeding: None End Time: 09:12 Procedural Pain: 0 Post Procedural Pain: 0 Response to Treatment: Procedure was tolerated well Level of Consciousness Awake and Alert (Post-procedure): Post Debridement Measurements of Total Wound Length: (cm) 5.5 Width: (cm) 3.5 Depth: (cm) 0.1 Volume: (cm) 1.512 Character of Wound/Ulcer Post  Debridement: Improved Post Procedure Diagnosis Same as Pre-procedure Electronic Signature(s) Signed: 01/15/2019 1:33:12 PM By: Worthy Keeler PA-C Signed: 01/15/2019 2:09:28 PM By: Montey Hora Entered By: Montey Hora on 01/15/2019 09:11:54 [name] ([id number]) -------------------------------------------------------------------------------- Debridement Details Patient Name: [name]. Date of Service: 01/15/2019 8:00 AM Medical Record Number: [id number] Patient Account Number: [id number] Date of Birth/Sex: 07/01/1933 (84 y.o. F) Treating RN: Montey Hora Primary Care Provider: Clarene Reamer Other Clinician: Referring Provider: Referral, Self Treating Provider/Extender: Melburn Hake, HOYT Weeks in Treatment: 0 Debridement Performed for Wound #2 Left,Anterior Lower Leg Assessment: Performed By: Physician STONE III, HOYT E., PA-C Debridement Type: Chemical/Enzymatic/Mechanical Agent Used: saline and gauze Level of Consciousness (Pre- Awake and Alert procedure): Pre-procedure Verification/Time Yes - 09:12 Out Taken: Start Time: 09:12 Pain Control: Lidocaine 4% Topical Solution Instrument: Other : saline and gauze Bleeding: None End Time: 09:13 Procedural Pain: 0 Post Procedural Pain: 0 Response to Treatment: Procedure was tolerated well Level of Consciousness Awake and Alert (Post-procedure): Post Debridement Measurements of Total Wound Length: (cm) 2.5 Width: (cm) 2 Depth: (cm) 0.1 Volume: (cm) 0.393 Character of Wound/Ulcer Post Debridement: Improved Post Procedure Diagnosis Same as Pre-procedure Electronic Signature(s) Signed: 01/15/2019 1:33:12 PM By: Worthy Keeler PA-C Signed: 01/15/2019 2:09:28 PM By: Montey Hora Entered By: Montey Hora on 01/15/2019 09:12:22 [name] ([id number]) -------------------------------------------------------------------------------- Debridement Details Patient Name: [name]. Date of Service: 01/15/2019 8:00 AM Medical Record Number: [id number] Patient Account Number: [id number] Date of Birth/Sex: March 14, 1934 (84 y.o. F) Treating RN: Montey Hora Primary Care Provider: Clarene Reamer Other Clinician: Referring Provider: Referral, Self Treating Provider/Extender: STONE III, HOYT Weeks in Treatment: 0 Debridement Performed for Wound #3 Left,Medial Lower Leg Assessment: Performed By: Physician STONE III, HOYT E., PA-C Debridement Type: Chemical/Enzymatic/Mechanical Agent Used: saline and gauze Level of Consciousness (Pre- Awake and Alert procedure): Pre-procedure Verification/Time Yes - 09:13 Out Taken: Start Time: 09:13 Pain Control: Lidocaine 4% Topical Solution Instrument: Other : saline and gauze Bleeding: None End Time: 09:14 Procedural Pain: 0 Post Procedural  Pain: 0 Response to Treatment: Procedure was tolerated well Level of Consciousness Awake and Alert (Post-procedure): Post Debridement Measurements of Total Wound Length: (cm) 1.5 Width: (cm) 3 Depth: (cm) 0.1 Volume: (cm) 0.353 Character of Wound/Ulcer Post Debridement: Improved Post Procedure Diagnosis Same as Pre-procedure Electronic Signature(s) Signed: 01/15/2019 1:33:12 PM By: Worthy Keeler PA-C Signed: 01/15/2019 2:09:28 PM By: Montey Hora Entered By: Montey Hora on 01/15/2019 09:12:51 [name] ([id number]) -------------------------------------------------------------------------------- HPI Details Patient Name: [name]. Date of Service: 01/15/2019 8:00 AM Medical Record Number: [id number] Patient Account Number: [id number] Date of Birth/Sex: [birth year] (84 y.o. F) Treating RN: Montey Hora Primary Care Provider: Clarene Reamer Other Clinician: Referring Provider: Referral, Self Treating Provider/Extender: Melburn Hake, HOYT Weeks in Treatment: 0 History of Present Illness HPI Description: 01/15/2019 on evaluation today patient actually  presents today for initial evaluation here in our clinic concerning issues she has been having with her left lower extremity. Unfortunately she has ulcers secondary to lymphedema that have been present for roughly 2 months. She has been seeing her primary care provider they are attempting to try to get her fluid down by way of medications using fluid pills and such. With that being said she only has 1 kidney secondary to having had kidney cancer and 1 of them removed in 2000 06-2001. Subsequently the remaining kidney is not functioning optimally she is at stage III kidney function. Nonetheless she also broke her hip back in June this is made things somewhat more difficult for her as well obviously. She tells me that she has significant pain pretty much all the time. She rates this to be a 10 out of 10. She is only able to take Tylenol she is not able to take any other pain medications either at this point which makes things all the worst. She does have a pacemaker as well as defibrillator. She also has peripheral vascular disease but again they are not able to go into perform any type of intervention and help in this regard secondary to the fact that she only has 1 kidney which is not functioning well. Lastly she also has chronic atrial fibrillation as well. Electronic Signature(s) Signed: 01/15/2019 9:28:29 AM By: Worthy Keeler PA-C Entered By: Worthy Keeler on 01/15/2019 73:71:06 [name] ([id number]) -------------------------------------------------------------------------------- Physical Exam Details Patient Name: [name]. Date of Service: 01/15/2019 8:00 AM Medical Record Number: [id number] Patient Account Number: [id number] Date of Birth/Sex: 04/04/1934 (84 y.o. F) Treating RN: Montey Hora Primary Care Provider: Clarene Reamer Other Clinician: Referring Provider: Referral, Self Treating Provider/Extender: STONE III, HOYT Weeks in Treatment:  0 Constitutional sitting or standing blood pressure is within target range for patient.. pulse regular and within target range for patient.Marland Kitchen respirations regular, non-labored and within target range for patient.Marland Kitchen temperature within target range for patient.. Well- nourished and well-hydrated in no acute distress. Eyes conjunctiva clear no eyelid edema noted. pupils equal round and reactive to light and accommodation. Ears, Nose, Mouth, and Throat no gross abnormality of ear auricles or external auditory canals. normal hearing noted during conversation. mucus membranes moist. Respiratory normal breathing without difficulty. clear to auscultation bilaterally. Cardiovascular regular rate and rhythm with normal S1, S2. 1+ dorsalis pedis/posterior tibialis pulses. Patient has significant stage III bilateral lower extremity lymphedema.. Gastrointestinal (GI) soft, non-tender, non-distended, +BS. no ventral hernia noted. Musculoskeletal Patient unable to walk without assistance. no significant deformity or arthritic changes, no loss or range of motion, no clubbing. Psychiatric this patient is able to make  decisions and demonstrates good insight into disease process. Alert and Oriented x 3. pleasant and cooperative. Notes Upon inspection patient's wound bed actually showed signs of a lot of dried/crusted drainage on the left lower extremity at mainly 3 locations currently. There did not appear to be any signs of infection which is good news. With that being said she has not been washing the leg I think this may be contributing to the fact that this is such a dry/crusted area at this point. I believe she could benefit from actually taken a shower and being able to wash her leg and I think that would be appropriate at this time. In fact I think washing her leg really well with Dial antibacterial soap which was recommended for her today would be optimal. This would also help with removal of the  dressings going forward. Electronic Signature(s) Signed: 01/15/2019 9:29:42 AM By: Worthy Keeler PA-C Entered By: Worthy Keeler on 01/15/2019 09:29:41 [name] ([id number]) -------------------------------------------------------------------------------- Physician Orders Details Patient Name: [name]. Date of Service: 01/15/2019 8:00 AM Medical Record Number: [id number] Patient Account Number: [id number] Date of Birth/Sex: [birth year] (84 y.o. F) Treating RN: Montey Hora Primary Care Provider: Clarene Reamer Other Clinician: Referring Provider: Referral, Self Treating Provider/Extender: Melburn Hake, HOYT Weeks in Treatment: 0 Verbal / Phone Orders: No Diagnosis Coding ICD-10 Coding Code Description I89.0 Lymphedema, not elsewhere classified L97.822 Non-pressure chronic ulcer of other part of left lower leg with fat layer exposed N18.3 Chronic kidney disease, stage 3 (moderate) I73.89 Other specified peripheral vascular diseases I48.20 Chronic atrial fibrillation, unspecified Wound Cleansing Wound #1 Left,Lateral Lower Leg o Clean wound with Normal Saline. o Dial antibacterial soap, wash wounds, rinse and pat dry prior to dressing wounds o May Shower, gently pat wound dry prior to applying new dressing. Wound #2 Left,Anterior Lower Leg o Clean wound with Normal Saline. o Dial antibacterial soap, wash wounds, rinse and pat dry prior to dressing wounds o May Shower, gently pat wound dry prior to applying new dressing. Wound #3 Left,Medial Lower Leg o Clean wound with Normal Saline. o Dial antibacterial soap, wash wounds, rinse and pat dry prior to dressing wounds o May Shower, gently pat wound dry prior to applying new dressing. Primary Wound Dressing Wound #1 Left,Lateral Lower Leg o Hydrafera Blue Ready Transfer Wound #2 Left,Anterior Lower Leg o Hydrafera Blue Ready Transfer Wound #3 Left,Medial Lower Leg o Hydrafera Blue Ready  Transfer Secondary Dressing Wound #1 Left,Lateral Lower Leg o ABD pad - secure with netting Wound #2 Left,Anterior Lower Leg o ABD pad - secure with netting Wound #3 Left,Medial Lower Leg o ABD pad - secure with netting [name], [name] ([id number]) Dressing Change Frequency Wound #1 Left,Lateral Lower Leg o Change dressing every other day. Wound #2 Left,Anterior Lower Leg o Change dressing every other day. Wound #3 Left,Medial Lower Leg o Change dressing every other day. Follow-up Appointments o Return Appointment in 1 week. Electronic Signature(s) Signed: 01/15/2019 1:33:12 PM By: Worthy Keeler PA-C Signed: 01/15/2019 2:09:28 PM By: Montey Hora Entered By: Montey Hora on 01/15/2019 09:18:25 [name] ([id number]) -------------------------------------------------------------------------------- Problem List Details Patient Name: [name], [name]. Date of Service: 01/15/2019 8:00 AM Medical Record Number: [id number] Patient Account Number: [id number] Date of Birth/Sex: Dec 31, 1933 (84 y.o. F) Treating RN: Montey Hora Primary Care Provider: Clarene Reamer Other Clinician: Referring Provider: Referral, Self Treating Provider/Extender: Melburn Hake, HOYT Weeks in Treatment: 0 Active Problems ICD-10 Evaluated Encounter Code Description Active Date Today Diagnosis I89.0  Lymphedema, not elsewhere classified 01/15/2019 No Yes L97.822 Non-pressure chronic ulcer of other part of left lower leg with 01/15/2019 No Yes fat layer exposed N18.3 Chronic kidney disease, stage 3 (moderate) 01/15/2019 No Yes I73.89 Other specified peripheral vascular diseases 01/15/2019 No Yes I48.20 Chronic atrial fibrillation, unspecified 01/15/2019 No Yes Inactive Problems Resolved Problems Electronic Signature(s) Signed: 01/15/2019 8:59:20 AM By: Worthy Keeler PA-C Entered By: Worthy Keeler on 01/15/2019 08:59:20 [name]  ([id number]) -------------------------------------------------------------------------------- Progress Note Details Patient Name: [name]. Date of Service: 01/15/2019 8:00 AM Medical Record Number: [id number] Patient Account Number: [id number] Date of Birth/Sex: 1933-06-30 (84 y.o. F) Treating RN: Montey Hora Primary Care Provider: Clarene Reamer Other Clinician: Referring Provider: Referral, Self Treating Provider/Extender: Melburn Hake, HOYT Weeks in Treatment: 0 Subjective Chief Complaint Information obtained from Patient Left LE Ulcer History of Present Illness (HPI) 01/15/2019 on evaluation today patient actually presents today for initial evaluation here in our clinic concerning issues she has been having with her left lower extremity. Unfortunately she has ulcers secondary to lymphedema that have been present for roughly 2 months. She has been seeing her primary care provider they are attempting to try to get her fluid down by way of medications using fluid pills and such. With that being said she only has 1 kidney secondary to having had kidney cancer and 1 of them removed in 2000 06-2001. Subsequently the remaining kidney is not functioning optimally she is at stage III kidney function. Nonetheless she also broke her hip back in June this is made things somewhat more difficult for her as well obviously. She tells me that she has significant pain pretty much all the time. She rates this to be a 10 out of 10. She is only able to take Tylenol she is not able to take any other pain medications either at this point which makes things all the worst. She does have a pacemaker as well as defibrillator. She also has peripheral vascular disease but again they are not able to go into perform any type of intervention and help in this regard secondary to the fact that she only has 1 kidney which is not functioning well. Lastly she also has chronic atrial fibrillation as well. Patient  History Information obtained from Patient. Allergies Dilaudid, codeine, morphine, Sulfa (Sulfonamide Antibiotics) Family History Diabetes - Child, Heart Disease - Child, Hypertension - Child, No family history of Cancer, Kidney Disease, Lung Disease, Seizures, Stroke, Thyroid Problems, Tuberculosis. Social History Former smoker, Marital Status - Widowed, Alcohol Use - Never, Drug Use - No History, Caffeine Use - Never. Medical History Eyes Patient has history of Cataracts - removed Hematologic/Lymphatic Patient has history of Anemia, Lymphedema Respiratory Patient has history of Chronic Obstructive Pulmonary Disease (COPD) Cardiovascular Patient has history of Arrhythmia, Congestive Heart Failure, Coronary Artery Disease, Peripheral Venous Disease Genitourinary Patient has history of End Stage Renal Disease - 1 kidney on left Neurologic Patient has history of Neuropathy - feet Oncologic [name], [name] ([id number]) Denies history of Received Chemotherapy, Received Radiation Hospitalization/Surgery History - -broken hip-June 2020. Medical And Surgical History Notes Oncologic Kidney removed 2003 Review of Systems (ROS) Eyes Denies complaints or symptoms of Dry Eyes, Vision Changes, Glasses / Contacts. Ear/Nose/Mouth/Throat Denies complaints or symptoms of Difficult clearing ears, Sinusitis. Respiratory Denies complaints or symptoms of Chronic or frequent coughs, Shortness of Breath. Cardiovascular Complains or has symptoms of LE edema. Gastrointestinal Denies complaints or symptoms of Frequent diarrhea, Nausea, Vomiting. Endocrine Denies complaints or symptoms  of Hepatitis, Thyroid disease, Polydypsia (Excessive Thirst). Immunological Denies complaints or symptoms of Hives, Itching. Integumentary (Skin) Complains or has symptoms of Wounds, Bleeding or bruising tendency. Musculoskeletal Denies complaints or symptoms of Muscle Pain, Muscle  Weakness. Psychiatric Denies complaints or symptoms of Anxiety, Claustrophobia. Objective Constitutional sitting or standing blood pressure is within target range for patient.. pulse regular and within target range for patient.Marland Kitchen respirations regular, non-labored and within target range for patient.Marland Kitchen temperature within target range for patient.. Well- nourished and well-hydrated in no acute distress. Vitals Time Taken: 8:30 AM, Height: 66 in, Weight: 165 lbs, BMI: 26.6, Temperature: 98.3 F, Pulse: 59 bpm, Respiratory Rate: 16 breaths/min, Blood Pressure: 124/42 mmHg. General Notes: Patient states she feels a little lightheaded. Manual BP 126/58 Eyes conjunctiva clear no eyelid edema noted. pupils equal round and reactive to light and accommodation. Ears, Nose, Mouth, and Throat no gross abnormality of ear auricles or external auditory canals. normal hearing noted during conversation. mucus membranes moist. Respiratory normal breathing without difficulty. clear to auscultation bilaterally. [name], [name]. ([id number]) Cardiovascular regular rate and rhythm with normal S1, S2. 1+ dorsalis pedis/posterior tibialis pulses. Patient has significant stage III bilateral lower extremity lymphedema.. Gastrointestinal (GI) soft, non-tender, non-distended, +BS. no ventral hernia noted. Musculoskeletal Patient unable to walk without assistance. no significant deformity or arthritic changes, no loss or range of motion, no clubbing. Psychiatric this patient is able to make decisions and demonstrates good insight into disease process. Alert and Oriented x 3. pleasant and cooperative. General Notes: Upon inspection patient's wound bed actually showed signs of a lot of dried/crusted drainage on the left lower extremity at mainly 3 locations currently. There did not appear to be any signs of infection which is good news. With that being said she has not been washing the leg I think this may be  contributing to the fact that this is such a dry/crusted area at this point. I believe she could benefit from actually taken a shower and being able to wash her leg and I think that would be appropriate at this time. In fact I think washing her leg really well with Dial antibacterial soap which was recommended for her today would be optimal. This would also help with removal of the dressings going forward. Integumentary (Hair, Skin) Wound #1 status is Open. Original cause of wound was Gradually Appeared. The wound is located on the Left,Lateral Lower Leg. The wound measures 5.5cm length x 3.5cm width x 0.1cm depth; 15.119cm^2 area and 1.512cm^3 volume. There is Fat Layer (Subcutaneous Tissue) Exposed exposed. There is no tunneling or undermining noted. There is a medium amount of serous drainage noted. The wound margin is thickened. There is no granulation within the wound bed. There is a large (67- 100%) amount of necrotic tissue within the wound bed including Eschar and Adherent Slough. Wound #2 status is Open. Original cause of wound was Gradually Appeared. The wound is located on the Left,Anterior Lower Leg. The wound measures 2.5cm length x 2cm width x 0.1cm depth; 3.927cm^2 area and 0.393cm^3 volume. There is Fat Layer (Subcutaneous Tissue) Exposed exposed. There is no tunneling or undermining noted. There is a medium amount of serous drainage noted. The wound margin is thickened. There is no granulation within the wound bed. There is a large (67- 100%) amount of necrotic tissue within the wound bed including Eschar. Wound #3 status is Open. Original cause of wound was Gradually Appeared. The wound is located on the Left,Medial Lower Leg. The wound measures 1.5cm  length x 3cm width x 0.1cm depth; 3.534cm^2 area and 0.353cm^3 volume. There is Fat Layer (Subcutaneous Tissue) Exposed exposed. There is no tunneling or undermining noted. There is a medium amount of serosanguineous drainage noted.  The wound margin is thickened. There is a large (67-100%) amount of necrotic tissue within the wound bed including Eschar and Adherent Slough. Assessment Active Problems ICD-10 Lymphedema, not elsewhere classified Non-pressure chronic ulcer of other part of left lower leg with fat layer exposed Chronic kidney disease, stage 3 (moderate) Other specified peripheral vascular diseases Chronic atrial fibrillation, unspecified [name], [name]. ([id number]) Procedures Wound #1 Pre-procedure diagnosis of Wound #1 is a Lymphedema located on the Left,Lateral Lower Leg . There was a Chemical/Enzymatic/Mechanical debridement performed by STONE III, HOYT E., PA-C. With the following instrument(s): saline and gauze after achieving pain control using Lidocaine 4% Topical Solution. Other agent used was saline and gauze. A time out was conducted at 09:11, prior to the start of the procedure. There was no bleeding. The procedure was tolerated well with a pain level of 0 throughout and a pain level of 0 following the procedure. Post Debridement Measurements: 5.5cm length x 3.5cm width x 0.1cm depth; 1.512cm^3 volume. Character of Wound/Ulcer Post Debridement is improved. Post procedure Diagnosis Wound #1: Same as Pre-Procedure Wound #2 Pre-procedure diagnosis of Wound #2 is a Lymphedema located on the Left,Anterior Lower Leg . There was a Chemical/Enzymatic/Mechanical debridement performed by STONE III, HOYT E., PA-C. With the following instrument(s): saline and gauze after achieving pain control using Lidocaine 4% Topical Solution. Other agent used was saline and gauze. A time out was conducted at 09:12, prior to the start of the procedure. There was no bleeding. The procedure was tolerated well with a pain level of 0 throughout and a pain level of 0 following the procedure. Post Debridement Measurements: 2.5cm length x 2cm width x 0.1cm depth; 0.393cm^3 volume. Character of Wound/Ulcer Post Debridement  is improved. Post procedure Diagnosis Wound #2: Same as Pre-Procedure Wound #3 Pre-procedure diagnosis of Wound #3 is a Lymphedema located on the Left,Medial Lower Leg . There was a Chemical/Enzymatic/Mechanical debridement performed by STONE III, HOYT E., PA-C. With the following instrument(s): saline and gauze after achieving pain control using Lidocaine 4% Topical Solution. Other agent used was saline and gauze. A time out was conducted at 09:13, prior to the start of the procedure. There was no bleeding. The procedure was tolerated well with a pain level of 0 throughout and a pain level of 0 following the procedure. Post Debridement Measurements: 1.5cm length x 3cm width x 0.1cm depth; 0.353cm^3 volume. Character of Wound/Ulcer Post Debridement is improved. Post procedure Diagnosis Wound #3: Same as Pre-Procedure Plan Wound Cleansing: Wound #1 Left,Lateral Lower Leg: Clean wound with Normal Saline. Dial antibacterial soap, wash wounds, rinse and pat dry prior to dressing wounds May Shower, gently pat wound dry prior to applying new dressing. Wound #2 Left,Anterior Lower Leg: Clean wound with Normal Saline. Dial antibacterial soap, wash wounds, rinse and pat dry prior to dressing wounds May Shower, gently pat wound dry prior to applying new dressing. Wound #3 Left,Medial Lower Leg: Clean wound with Normal Saline. Dial antibacterial soap, wash wounds, rinse and pat dry prior to dressing wounds May Shower, gently pat wound dry prior to applying new dressing. Primary Wound Dressing: ANAHIT, KLUMB ([id number]) Wound #1 Left,Lateral Lower Leg: Hydrafera Blue Ready Transfer Wound #2 Left,Anterior Lower Leg: Hydrafera Blue Ready Transfer Wound #3 Left,Medial Lower Leg: Hydrafera Blue Ready Transfer Secondary  Dressing: Wound #1 Left,Lateral Lower Leg: ABD pad - secure with netting Wound #2 Left,Anterior Lower Leg: ABD pad - secure with netting Wound #3 Left,Medial Lower  Leg: ABD pad - secure with netting Dressing Change Frequency: Wound #1 Left,Lateral Lower Leg: Change dressing every other day. Wound #2 Left,Anterior Lower Leg: Change dressing every other day. Wound #3 Left,Medial Lower Leg: Change dressing every other day. Follow-up Appointments: Return Appointment in 1 week. 1. I would recommend currently that we go ahead and initiate treatment with a Hydrofera Blue dressing which I am hopeful will be beneficial for her at this point. 2. I am getting suggest as well that we use ABD pads and then just stretch netting over top of this to try to avoid any tape to the skin. I am concerned about the tape causing a problem here if we can avoid that I would like to. 3. I am also going to suggest currently that is much as possible the patient attempt to elevate her legs although I understand this is difficult for her secondary to the pain. 4. Lastly with regard to compression that is really not an option for [place] at this time secondary both to the pain as well as her peripheral vascular status apparently this is not the best although she has not seen vascular for some time they previously told her they were unable to do anything without potentially harming her kidneys. We will see patient back for reevaluation in 1 week here in the clinic. If anything worsens or changes patient will contact our office for additional recommendations. Electronic Signature(s) Signed: 01/15/2019 9:30:48 AM By: Worthy Keeler PA-C Entered By: Worthy Keeler on 01/15/2019 09:30:48 [name] ([id number]) -------------------------------------------------------------------------------- ROS/PFSH Details Patient Name: [name]. Date of Service: 01/15/2019 8:00 AM Medical Record Number: [id number] Patient Account Number: [id number] Date of Birth/Sex: January 16, 1934 (84 y.o. F) Treating RN: Cornell Barman Primary Care Provider: Clarene Reamer Other Clinician: Referring  Provider: Referral, Self Treating Provider/Extender: Melburn Hake, HOYT Weeks in Treatment: 0 Information Obtained From Patient Eyes Complaints and Symptoms: Negative for: Dry Eyes; Vision Changes; Glasses / Contacts Medical History: Positive for: Cataracts - removed Ear/Nose/Mouth/Throat Complaints and Symptoms: Negative for: Difficult clearing ears; Sinusitis Respiratory Complaints and Symptoms: Negative for: Chronic or frequent coughs; Shortness of Breath Medical History: Positive for: Chronic Obstructive Pulmonary Disease (COPD) Cardiovascular Complaints and Symptoms: Positive for: LE edema Medical History: Positive for: Arrhythmia; Congestive Heart Failure; Coronary Artery Disease; Peripheral Venous Disease Gastrointestinal Complaints and Symptoms: Negative for: Frequent diarrhea; Nausea; Vomiting Endocrine Complaints and Symptoms: Negative for: Hepatitis; Thyroid disease; Polydypsia (Excessive Thirst) Immunological Complaints and Symptoms: Negative for: Hives; Itching Integumentary (Skin) [name], [name] ([id number]) Complaints and Symptoms: Positive for: Wounds; Bleeding or bruising tendency Musculoskeletal Complaints and Symptoms: Negative for: Muscle Pain; Muscle Weakness Psychiatric Complaints and Symptoms: Negative for: Anxiety; Claustrophobia Hematologic/Lymphatic Medical History: Positive for: Anemia; Lymphedema Genitourinary Medical History: Positive for: End Stage Renal Disease - 1 kidney on left Neurologic Medical History: Positive for: Neuropathy - feet Oncologic Medical History: Negative for: Received Chemotherapy; Received Radiation Past Medical History Notes: Kidney removed 2003 HBO Extended History Items Eyes: Cataracts Immunizations Pneumococcal Vaccine: Received Pneumococcal Vaccination: Yes Implantable Devices Yes Hospitalization / Surgery History Type of Hospitalization/Surgery [place]-broken hip-June 2020 Family and  Social History Cancer: No; Diabetes: Yes - Child; Heart Disease: Yes - Child; Hypertension: Yes - Child; Kidney Disease: No; Lung Disease: No; Seizures: No; Stroke: No; Thyroid Problems: No; Tuberculosis: No; Former smoker; Marital  Status - Widowed; Alcohol Use: Never; Drug Use: No History; Caffeine Use: Never Electronic Signature(s) Signed: 01/15/2019 1:33:12 PM By: Marciano Sequin ([id number]) Signed: 01/19/2019 1:29:00 PM By: Gretta Cool, BSN, RN, CWS, Kim RN, BSN Entered By: Gretta Cool, BSN, RN, CWS, Kim on 01/15/2019 09:03:43 [name] ([id number]) -------------------------------------------------------------------------------- SuperBill Details Patient Name: [name]. Date of Service: 01/15/2019 Medical Record Number: [id number] Patient Account Number: [id number] Date of Birth/Sex: 08-21-1933 (85 y.o. F) Treating RN: Montey Hora Primary Care Provider: Clarene Reamer Other Clinician: Referring Provider: Referral, Self Treating Provider/Extender: Melburn Hake, HOYT Weeks in Treatment: 0 Diagnosis Coding ICD-10 Codes Code Description I89.0 Lymphedema, not elsewhere classified L97.822 Non-pressure chronic ulcer of other part of left lower leg with fat layer exposed N18.3 Chronic kidney disease, stage 3 (moderate) I73.89 Other specified peripheral vascular diseases I48.20 Chronic atrial fibrillation, unspecified Facility Procedures CPT4 Code: 00525910 Description: 28902 - WOUND CARE VISIT-LEV 5 EST PT Modifier: Quantity: 1 Physician Procedures CPT4 Code Description: 2840698 61483 - WC PHYS LEVEL 4 - NEW PT ICD-10 Diagnosis Description I89.0 Lymphedema, not elsewhere classified L97.822 Non-pressure chronic ulcer of other part of left lower leg wit N18.3 Chronic kidney disease, stage 3  (moderate) I73.89 Other specified peripheral vascular diseases Modifier: h fat layer expos Quantity: 1 ed Electronic Signature(s) Signed: 01/15/2019 9:31:02 AM By:  Worthy Keeler PA-C Entered By: Worthy Keeler on 01/15/2019 09:31:01

## 2019-01-19 NOTE — Progress Notes (Signed)
KERA, DEACON ([id number]) Visit Report for 01/15/2019 Abuse/Suicide Risk Screen Details Patient Name: [name], [name]. Date of Service: 01/15/2019 8:00 AM Medical Record Number: [id number] Patient Account Number: [id number] Date of Birth/Sex: 23-Aug-1933 (84 y.o. F) Treating RN: Cornell Barman Primary Care Yassmine Tamm: Clarene Reamer Other Clinician: Referring Eniya Cannady: Referral, Self Treating Griffey Nicasio/Extender: Melburn Hake, HOYT Weeks in Treatment: 0 Abuse/Suicide Risk Screen Items Answer ABUSE RISK SCREEN: Has anyone close to you tried to hurt or harm you recentlyo No Do you feel uncomfortable with anyone in your familyo No Has anyone forced you do things that you didnot want to doo No Electronic Signature(s) Signed: 01/19/2019 1:29:00 PM By: Gretta Cool, BSN, RN, CWS, Kim RN, BSN Entered By: Gretta Cool, BSN, RN, CWS, Kim on 01/15/2019 08:40:43 [name] ([id number]) -------------------------------------------------------------------------------- Activities of Daily Living Details Patient Name: [name], [name]. Date of Service: 01/15/2019 8:00 AM Medical Record Number: [id number] Patient Account Number: [id number] Date of Birth/Sex: Jun 11, 1933 (84 y.o. F) Treating RN: Cornell Barman Primary Care Lorean Ekstrand: Clarene Reamer Other Clinician: Referring Dailynn Nancarrow: Referral, Self Treating Willene Holian/Extender: Melburn Hake, HOYT Weeks in Treatment: 0 Activities of Daily Living Items Answer Activities of Daily Living (Please select one for each item) Drive Automobile Completely Able Take Medications Completely Able Use Telephone Completely Able Care for Appearance Completely Able Use Toilet Completely Able Bath / Shower Completely Able Dress Self Completely Able Feed Self Completely Able Walk Completely Able Get In / Out Bed Completely Able Housework Completely Able Prepare Meals Completely Able Handle Money Completely Able Shop for Self Completely Able Electronic Signature(s) Signed:  01/19/2019 1:29:00 PM By: Gretta Cool, BSN, RN, CWS, Kim RN, BSN Entered By: Gretta Cool, BSN, RN, CWS, Kim on 01/15/2019 08:40:55 [name] ([id number]) -------------------------------------------------------------------------------- Education Screening Details Patient Name: [name]. Date of Service: 01/15/2019 8:00 AM Medical Record Number: [id number] Patient Account Number: [id number] Date of Birth/Sex: Nov 21, 1933 (84 y.o. F) Treating RN: Cornell Barman Primary Care Raheem Kolbe: Clarene Reamer Other Clinician: Referring Keymari Sato: Referral, Self Treating Alazne Quant/Extender: Melburn Hake, HOYT Weeks in Treatment: 0 Learning Preferences/Education Level/Primary Language Learning Preference: Explanation Highest Education Level: High School Preferred Language: English Cognitive Barrier Language Barrier: No Translator Needed: No Memory Deficit: No Emotional Barrier: No Cultural/Religious Beliefs Affecting Medical Care: No Physical Barrier Impaired Vision: No Impaired Hearing: No Decreased Hand dexterity: No Knowledge/Comprehension Knowledge Level: Medium Comprehension Level: Medium Ability to understand written Medium instructions: Ability to understand verbal Medium instructions: Motivation Anxiety Level: Calm Cooperation: Cooperative Education Importance: Acknowledges Need Interest in Health Problems: Asks Questions Perception: Coherent Willingness to Engage in Self- High Management Activities: Readiness to Engage in Self- High Management Activities: Electronic Signature(s) Signed: 01/19/2019 1:29:00 PM By: Gretta Cool, BSN, RN, CWS, Kim RN, BSN Entered By: Gretta Cool, BSN, RN, CWS, Kim on 01/15/2019 08:41:32 [name] ([id number]) -------------------------------------------------------------------------------- Fall Risk Assessment Details Patient Name: [name]. Date of Service: 01/15/2019 8:00 AM Medical Record Number: [id number] Patient Account Number:  [id number] Date of Birth/Sex: [birth year] (84 y.o. F) Treating RN: Cornell Barman Primary Care Aundrea Higginbotham: Clarene Reamer Other Clinician: Referring Yancey Pedley: Referral, Self Treating Milayna Rotenberg/Extender: Melburn Hake, HOYT Weeks in Treatment: 0 Fall Risk Assessment Items Have you had 2 or more falls in the last 12 monthso 0 Yes Have you had any fall that resulted in injury in the last 12 monthso 0 Yes FALLS RISK SCREEN History of falling - immediate or within 3 months 0 No Secondary diagnosis (Do you have 2 or more medical diagnoseso) 0 No Ambulatory aid None/bed rest/wheelchair/nurse 0 No Crutches/cane/walker  15 Yes Furniture 0 No Intravenous therapy Access/Saline/Heparin Lock 0 No Gait/Transferring Normal/ bed rest/ wheelchair 0 No Weak (short steps with or without shuffle, stooped but able to lift head while 10 Yes walking, may seek support from furniture) Impaired (short steps with shuffle, may have difficulty arising from chair, head 0 No down, impaired balance) Mental Status Oriented to own ability 0 No Electronic Signature(s) Signed: 01/19/2019 1:29:00 PM By: Gretta Cool, BSN, RN, CWS, Kim RN, BSN Entered By: Gretta Cool, BSN, RN, CWS, Kim on 01/15/2019 08:42:20 [name] ([id number]) -------------------------------------------------------------------------------- Foot Assessment Details Patient Name: [name]. Date of Service: 01/15/2019 8:00 AM Medical Record Number: [id number] Patient Account Number: [id number] Date of Birth/Sex: 1934-03-12 (84 y.o. F) Treating RN: Cornell Barman Primary Care Mohsin Crum: Clarene Reamer Other Clinician: Referring Fynn Vanblarcom: Referral, Self Treating Makenleigh Crownover/Extender: Melburn Hake, HOYT Weeks in Treatment: 0 Foot Assessment Items Site Locations + = Sensation present, - = Sensation absent, C = Callus, U = Ulcer R = Redness, W = Warmth, M = Maceration, PU = Pre-ulcerative lesion F = Fissure, S = Swelling, D = Dryness Assessment Right:  Left: Other Deformity: No No Prior Foot Ulcer: No No Prior Amputation: No No Charcot Joint: No No Ambulatory Status: Gait: Electronic Signature(s) Signed: 01/19/2019 1:29:00 PM By: Gretta Cool, BSN, RN, CWS, Kim RN, BSN Entered By: Gretta Cool, BSN, RN, CWS, Kim on 01/15/2019 08:44:01 [name] ([id number]) -------------------------------------------------------------------------------- Nutrition Risk Screening Details Patient Name: [name]. Date of Service: 01/15/2019 8:00 AM Medical Record Number: [id number] Patient Account Number: [id number] Date of Birth/Sex: 11/09/1933 (84 y.o. F) Treating RN: Cornell Barman Primary Care Kyann Heydt: Clarene Reamer Other Clinician: Referring Quame Spratlin: Referral, Self Treating Chrissy Ealey/Extender: Melburn Hake, HOYT Weeks in Treatment: 0 Height (in): 66 Weight (lbs): 165 Body Mass Index (BMI): 26.6 Nutrition Risk Screening Items Score Screening NUTRITION RISK SCREEN: I have an illness or condition that made me change the kind and/or amount of 0 No food I eat I eat fewer than two meals per day 0 No I eat few fruits and vegetables, or milk products 0 No I have three or more drinks of beer, liquor or wine almost every day 0 No I have tooth or mouth problems that make it hard for me to eat 0 No I don't always have enough money to buy the food I need 0 No I eat alone most of the time 1 Yes I take three or more different prescribed or over-the-counter drugs a day 1 Yes Without wanting to, I have lost or gained 10 pounds in the last six months 2 Yes I am not always physically able to shop, cook and/or feed myself 0 No Nutrition Protocols Good Risk Protocol Provide education on Moderate Risk Protocol 0 nutrition High Risk Proctocol Risk Level: Moderate Risk Score: 4 Electronic Signature(s) Signed: 01/19/2019 1:29:00 PM By: Gretta Cool, BSN, RN, CWS, Kim RN, BSN Entered By: Gretta Cool, BSN, RN, CWS, Kim on 01/15/2019 08:42:44

## 2019-01-19 NOTE — Progress Notes (Addendum)
[name], [name] ([id number]) Visit Report for 01/15/2019 Allergy List Details Patient Name: [name], [name]. Date of Service: 01/15/2019 8:00 AM Medical Record Number: [id number] Patient Account Number: [id number] Date of Birth/Sex: [birth year] (84 y.o. F) Treating RN: Cornell Barman Primary Care Susumu Hackler: Clarene Reamer Other Clinician: Referring Paulo Keimig: Referral, Self Treating Najmo Pardue/Extender: Melburn Hake, HOYT Weeks in Treatment: 0 Allergies Active Allergies Dilaudid codeine morphine Sulfa (Sulfonamide Antibiotics) Allergy Notes Electronic Signature(s) Signed: 01/19/2019 1:29:00 PM By: Gretta Cool, BSN, RN, CWS, Kim RN, BSN Entered By: Gretta Cool, BSN, RN, CWS, Kim on 01/15/2019 08:34:34 [name] ([id number]) -------------------------------------------------------------------------------- Arrival Information Details Patient Name: [name]. Date of Service: 01/15/2019 8:00 AM Medical Record Number: [id number] Patient Account Number: [id number] Date of Birth/Sex: 08-22-1933 (84 y.o. F) Treating RN: Cornell Barman Primary Care Evangelynn Lochridge: Clarene Reamer Other Clinician: Referring Zikeria Keough: Referral, Self Treating Canio Winokur/Extender: Melburn Hake, HOYT Weeks in Treatment: 0 Visit Information Patient Arrived: Ambulatory Arrival Time: 08:27 Accompanied By: Ellwood Dense Transfer Assistance: None Patient Identification Verified: Yes Secondary Verification Process Yes Completed: Patient Requires Transmission-Based No Precautions: Patient Has Alerts: Yes Patient Alerts: Patient on Blood Thinner Warfarin Electronic Signature(s) Signed: 01/19/2019 1:29:00 PM By: Gretta Cool, BSN, RN, CWS, Kim RN, BSN Entered By: Gretta Cool, BSN, RN, CWS, Kim on 01/15/2019 08:29:33 [name] ([id number]) -------------------------------------------------------------------------------- Clinic Level of Care Assessment Details Patient Name: [name], [name]. Date of Service: 01/15/2019 8:00  AM Medical Record Number: [id number] Patient Account Number: [id number] Date of Birth/Sex: [birth year] (84 y.o. F) Treating RN: Montey Hora Primary Care Adriane Gabbert: Clarene Reamer Other Clinician: Referring Adal Sereno: Referral, Self Treating Shatera Rennert/Extender: STONE III, HOYT Weeks in Treatment: 0 Clinic Level of Care Assessment Items TOOL 2 Quantity Score []  - Use when only an EandM is performed on the INITIAL visit 0 ASSESSMENTS - Nursing Assessment / Reassessment X - General Physical Exam (combine w/ comprehensive assessment (listed just below) when 1 20 performed on new pt. evals) X- 1 25 Comprehensive Assessment (HX, ROS, Risk Assessments, Wounds Hx, etc.) ASSESSMENTS - Wound and Skin Assessment / Reassessment []  - Simple Wound Assessment / Reassessment - one wound 0 X- 3 5 Complex Wound Assessment / Reassessment - multiple wounds []  - 0 Dermatologic / Skin Assessment (not related to wound area) ASSESSMENTS - Ostomy and/or Continence Assessment and Care []  - Incontinence Assessment and Management 0 []  - 0 Ostomy Care Assessment and Management (repouching, etc.) PROCESS - Coordination of Care X - Simple Patient / Family Education for ongoing care 1 15 []  - 0 Complex (extensive) Patient / Family Education for ongoing care X- 1 10 Staff obtains Programmer, systems, Records, Test Results / Process Orders []  - 0 Staff telephones HHA, Nursing Homes / Clarify orders / etc []  - 0 Routine Transfer to another Facility (non-emergent condition) []  - 0 Routine Hospital Admission (non-emergent condition) []  - 0 New Admissions / Biomedical engineer / Ordering NPWT, Apligraf, etc. []  - 0 Emergency Hospital Admission (emergent condition) X- 1 10 Simple Discharge Coordination []  - 0 Complex (extensive) Discharge Coordination PROCESS - Special Needs []  - Pediatric / Minor Patient Management 0 []  - 0 Isolation Patient Management [name], [name] ([id number]) []  - 0 Hearing / Language  / Visual special needs []  - 0 Assessment of Community assistance (transportation, D/C planning, etc.) []  - 0 Additional assistance / Altered mentation []  - 0 Support Surface(s) Assessment (bed, cushion, seat, etc.) INTERVENTIONS - Wound Cleansing / Measurement X - Wound Imaging (photographs - any number of wounds) 1 5 []  - 0 Wound Tracing (  instead of photographs) []  - 0 Simple Wound Measurement - one wound X- 3 5 Complex Wound Measurement - multiple wounds []  - 0 Simple Wound Cleansing - one wound X- 3 5 Complex Wound Cleansing - multiple wounds INTERVENTIONS - Wound Dressings X - Small Wound Dressing one or multiple wounds 3 10 []  - 0 Medium Wound Dressing one or multiple wounds []  - 0 Large Wound Dressing one or multiple wounds []  - 0 Application of Medications - injection INTERVENTIONS - Miscellaneous []  - External ear exam 0 []  - 0 Specimen Collection (cultures, biopsies, blood, body fluids, etc.) []  - 0 Specimen(s) / Culture(s) sent or taken to Lab for analysis []  - 0 Patient Transfer (multiple staff / Civil Service fast streamer / Similar devices) []  - 0 Simple Staple / Suture removal (25 or less) []  - 0 Complex Staple / Suture removal (26 or more) []  - 0 Hypo / Hyperglycemic Management (close monitor of Blood Glucose) []  - 0 Ankle / Brachial Index (ABI) - do not check if billed separately Has the patient been seen at the hospital within the last three years: Yes Total Score: 160 Level Of Care: New/Established - Level 5 Electronic Signature(s) Signed: 01/15/2019 2:09:28 PM By: Montey Hora Entered By: Montey Hora on 01/15/2019 09:15:26 [name] ([id number]) -------------------------------------------------------------------------------- Lower Extremity Assessment Details Patient Name: [name]. Date of Service: 01/15/2019 8:00 AM Medical Record Number: [id number] Patient Account Number: [id number] Date of Birth/Sex: August 29, 1933 (84 y.o. F) Treating RN:  Cornell Barman Primary Care Malvern Kadlec: Clarene Reamer Other Clinician: Referring Wynelle Dreier: Referral, Self Treating Kresha Abelson/Extender: Melburn Hake, HOYT Weeks in Treatment: 0 Edema Assessment Assessed: [Left: No] [Right: No] [Left: Edema] [Right: :] Calf Left: Right: Point of Measurement: 33 cm From Medial Instep 45 cm cm Ankle Left: Right: Point of Measurement: 10 cm From Medial Instep 29 cm cm Vascular Assessment Pulses: Dorsalis Pedis Palpable: [Left:Yes] Posterior Tibial Palpable: [Left:Yes] Notes Pulses faint Electronic Signature(s) Signed: 01/19/2019 1:29:00 PM By: Gretta Cool, BSN, RN, CWS, Kim RN, BSN Entered By: Gretta Cool, BSN, RN, CWS, Kim on 01/15/2019 08:55:28 [name] ([id number]) -------------------------------------------------------------------------------- Multi Wound Chart Details Patient Name: [name]. Date of Service: 01/15/2019 8:00 AM Medical Record Number: [id number] Patient Account Number: [id number] Date of Birth/Sex: 29-Apr-1934 (84 y.o. F) Treating RN: Montey Hora Primary Care Janari Gagner: Clarene Reamer Other Clinician: Referring Beren Yniguez: Referral, Self Treating Yamir Carignan/Extender: STONE III, HOYT Weeks in Treatment: 0 Vital Signs Height(in): 66 Pulse(bpm): 90 Weight(lbs): 165 Blood Pressure(mmHg): 124/42 Body Mass Index(BMI): 27 Temperature(F): 98.3 Respiratory Rate 16 (breaths/min): Photos: Wound Location: Left Lower Leg - Lateral Left Lower Leg - Anterior Left Lower Leg - Medial Wounding Event: Gradually Appeared Gradually Appeared Gradually Appeared Primary Etiology: Lymphedema Lymphedema Lymphedema Comorbid History: Cataracts, Anemia, Cataracts, Anemia, Cataracts, Anemia, Lymphedema, Chronic Lymphedema, Chronic Lymphedema, Chronic Obstructive Pulmonary Obstructive Pulmonary Obstructive Pulmonary Disease (COPD), Arrhythmia, Disease (COPD), Arrhythmia, Disease (COPD), Arrhythmia, Congestive Heart Failure, Congestive Heart  Failure, Congestive Heart Failure, Coronary Artery Disease, Coronary Artery Disease, Coronary Artery Disease, Peripheral Venous Disease, Peripheral Venous Disease, Peripheral Venous Disease, End Stage Renal Disease, End Stage Renal Disease, End Stage Renal Disease, Neuropathy Neuropathy Neuropathy Date Acquired: 11/14/2018 11/16/2018 11/16/2018 Weeks of Treatment: 0 0 0 Wound Status: Open Open Open Measurements L x W x D 5.5x3.5x0.1 2.5x2x0.1 1.5x3x0.1 (cm) Area (cm) : 15.119 3.927 3.534 Volume (cm) : 1.512 0.393 0.353 % Reduction in Area: 0.00% 0.00% 0.00% % Reduction in Volume: 0.00% 0.00% 0.00% Classification: Full Thickness Without Full Thickness Without Full Thickness Without Exposed  Support Structures Exposed Support Structures Exposed Support Structures Exudate Amount: Medium Medium Medium Exudate Type: Serous Serous Serosanguineous Exudate Color: amber amber red, brown Wound Margin: Thickened Thickened Thickened Granulation Amount: None Present (0%) None Present (0%) N/A Necrotic Amount: Large (67-100%) Large (67-100%) N/A [name], [name] ([id number]) Necrotic Tissue: Eschar, Adherent [place] Exposed Structures: Fat Layer (Subcutaneous Fat Layer (Subcutaneous Fat Layer (Subcutaneous Tissue) Exposed: Yes Tissue) Exposed: Yes Tissue) Exposed: Yes Fascia: No Fascia: No Fascia: No Tendon: No Tendon: No Tendon: No Muscle: No Muscle: No Muscle: No Joint: No Joint: No Joint: No Bone: No Bone: No Bone: No Epithelialization: None None None Treatment Notes Electronic Signature(s) Signed: 01/15/2019 2:09:28 PM By: Montey Hora Entered By: Montey Hora on 01/15/2019 09:07:48 [name] ([id number]) -------------------------------------------------------------------------------- [place] Details Patient Name: [name]. Date of Service: 01/15/2019 8:00 AM Medical Record Number: [id number] Patient  Account Number: [id number] Date of Birth/Sex: 05-31-1933 (84 y.o. F) Treating RN: Montey Hora Primary Care Trenee Igoe: Clarene Reamer Other Clinician: Referring Lexee Brashears: Referral, Self Treating Shellie Goettl/Extender: Melburn Hake, HOYT Weeks in Treatment: 0 Active Inactive Electronic Signature(s) Signed: 01/25/2019 1:44:02 PM By: Gretta Cool, BSN, RN, CWS, Kim RN, BSN Signed: 03/24/2019 4:48:32 PM By: Montey Hora Previous Signature: 01/15/2019 2:09:28 PM Version By: Montey Hora Entered By: Gretta Cool BSN, RN, CWS, Kim on 01/25/2019 13:44:01 [name] ([id number]) -------------------------------------------------------------------------------- Pain Assessment Details Patient Name: SHERYL, SAINTIL. Date of Service: 01/15/2019 8:00 AM Medical Record Number: [id number] Patient Account Number: [id number] Date of Birth/Sex: 1933-09-26 (84 y.o. F) Treating RN: Cornell Barman Primary Care Fumio Vandam: Clarene Reamer Other Clinician: Referring Eilee Schader: Referral, Self Treating Avelina Mcclurkin/Extender: Melburn Hake, HOYT Weeks in Treatment: 0 Active Problems Location of Pain Severity and Description of Pain Patient Has Paino Yes Site Locations Pain Location: Pain in Ulcers With Dressing Change: Yes Duration of the Pain. Constant / Intermittento Constant Rate the pain. Current Pain Level: 10 Character of Pain Describe the Pain: Burning Pain Management and Medication Current Pain Management: Electronic Signature(s) Signed: 01/19/2019 1:29:00 PM By: Gretta Cool, BSN, RN, CWS, Kim RN, BSN Entered By: Gretta Cool, BSN, RN, CWS, Kim on 01/15/2019 08:30:15 [name] ([id number]) -------------------------------------------------------------------------------- Patient/Caregiver Education Details Patient Name: [name]. Date of Service: 01/15/2019 8:00 AM Medical Record Number: [id number] Patient Account Number: [id number] Date of Birth/Gender: 09/03/1933 (84 y.o. F) Treating RN: Montey Hora Primary Care Physician: Clarene Reamer Other Clinician: Referring Physician: Referral, Self Treating Physician/Extender: Sharalyn Ink in Treatment: 0 Education Assessment Education Provided To: Patient and Caregiver Education Topics Provided Wound/Skin Impairment: Handouts: Other: wound care as ordered Methods: Demonstration, Explain/Verbal Responses: State content correctly Electronic Signature(s) Signed: 01/15/2019 2:09:28 PM By: Montey Hora Entered By: Montey Hora on 01/15/2019 09:16:02 [name] ([id number]) -------------------------------------------------------------------------------- Wound Assessment Details Patient Name: [name]. Date of Service: 01/15/2019 8:00 AM Medical Record Number: [id number] Patient Account Number: [id number] Date of Birth/Sex: [birth year] (84 y.o. F) Treating RN: Cornell Barman Primary Care Desaray Marschner: Clarene Reamer Other Clinician: Referring Soumya Colson: Referral, Self Treating Aviannah Castoro/Extender: STONE III, HOYT Weeks in Treatment: 0 Wound Status Wound Number: 1 Primary Lymphedema Etiology: Wound Location: Left Lower Leg - Lateral Wound Open Wounding Event: Gradually Appeared Status: Date Acquired: 11/14/2018 Comorbid Cataracts, Anemia, Lymphedema, Chronic Weeks Of Treatment: 0 History: Obstructive Pulmonary Disease (COPD), Clustered Wound: No Arrhythmia, Congestive Heart Failure, Coronary Artery Disease, Peripheral Venous Disease, End Stage Renal Disease, Neuropathy Photos Wound Measurements Length: (cm) 5.5 Width: (cm) 3.5 Depth: (cm) 0.1 Area: (cm) 15.119 Volume: (  cm) 1.512 % Reduction in Area: 0% % Reduction in Volume: 0% Epithelialization: None Tunneling: No Undermining: No Wound Description Full Thickness Without Exposed Support Foul Odo Classification: Structures Slough/F Wound Margin: Thickened Exudate Medium Amount: Exudate Type: Serous Exudate Color: amber r After  Cleansing: No ibrino Yes Wound Bed Granulation Amount: None Present (0%) Exposed Structure Necrotic Amount: Large (67-100%) Fascia Exposed: No Necrotic Quality: Eschar, Adherent Slough Fat Layer (Subcutaneous Tissue) Exposed: Yes Tendon Exposed: No Muscle Exposed: No Joint Exposed: No [name], [name] ([id number]) Bone Exposed: No Electronic Signature(s) Signed: 01/19/2019 1:29:00 PM By: Gretta Cool, BSN, RN, CWS, Kim RN, BSN Entered By: Gretta Cool, BSN, RN, CWS, Kim on 01/15/2019 08:51:25 [name] ([id number]) -------------------------------------------------------------------------------- Wound Assessment Details Patient Name: [name]. Date of Service: 01/15/2019 8:00 AM Medical Record Number: [id number] Patient Account Number: [id number] Date of Birth/Sex: 1933-10-23 (84 y.o. F) Treating RN: Cornell Barman Primary Care Patty Leitzke: Clarene Reamer Other Clinician: Referring Makylee Sanborn: Referral, Self Treating Sayla Golonka/Extender: STONE III, HOYT Weeks in Treatment: 0 Wound Status Wound Number: 2 Primary Lymphedema Etiology: Wound Location: Left Lower Leg - Anterior Wound Open Wounding Event: Gradually Appeared Status: Date Acquired: 11/16/2018 Comorbid Cataracts, Anemia, Lymphedema, Chronic Weeks Of Treatment: 0 History: Obstructive Pulmonary Disease (COPD), Clustered Wound: No Arrhythmia, Congestive Heart Failure, Coronary Artery Disease, Peripheral Venous Disease, End Stage Renal Disease, Neuropathy Photos Wound Measurements Length: (cm) 2.5 Width: (cm) 2 Depth: (cm) 0.1 Area: (cm) 3.927 Volume: (cm) 0.393 % Reduction in Area: 0% % Reduction in Volume: 0% Epithelialization: None Tunneling: No Undermining: No Wound Description Full Thickness Without Exposed Support Foul Odo Classification: Structures Slough/F Wound Margin: Thickened Exudate Medium Amount: Exudate Type: Serous Exudate Color: amber r After Cleansing: No ibrino Yes Wound  Bed Granulation Amount: None Present (0%) Exposed Structure Necrotic Amount: Large (67-100%) Fascia Exposed: No Necrotic Quality: Eschar Fat Layer (Subcutaneous Tissue) Exposed: Yes Tendon Exposed: No Muscle Exposed: No Joint Exposed: No [name], [name] ([id number]) Bone Exposed: No Electronic Signature(s) Signed: 01/19/2019 1:29:00 PM By: Gretta Cool, BSN, RN, CWS, Kim RN, BSN Entered By: Gretta Cool, BSN, RN, CWS, Kim on 01/15/2019 08:52:22 [name] ([id number]) -------------------------------------------------------------------------------- Wound Assessment Details Patient Name: [name]. Date of Service: 01/15/2019 8:00 AM Medical Record Number: [id number] Patient Account Number: [id number] Date of Birth/Sex: 09/29/1933 (84 y.o. F) Treating RN: Cornell Barman Primary Care Riyansh Gerstner: Clarene Reamer Other Clinician: Referring Cherylanne Ardelean: Referral, Self Treating Marcee Jacobs/Extender: STONE III, HOYT Weeks in Treatment: 0 Wound Status Wound Number: 3 Primary Lymphedema Etiology: Wound Location: Left Lower Leg - Medial Wound Open Wounding Event: Gradually Appeared Status: Date Acquired: 11/16/2018 Comorbid Cataracts, Anemia, Lymphedema, Chronic Weeks Of Treatment: 0 History: Obstructive Pulmonary Disease (COPD), Clustered Wound: No Arrhythmia, Congestive Heart Failure, Coronary Artery Disease, Peripheral Venous Disease, End Stage Renal Disease, Neuropathy Photos Wound Measurements Length: (cm) 1.5 Width: (cm) 3 Depth: (cm) 0.1 Area: (cm) 3.534 Volume: (cm) 0.353 % Reduction in Area: 0% % Reduction in Volume: 0% Epithelialization: None Tunneling: No Undermining: No Wound Description Full Thickness Without Exposed Support Slough/F Classification: Structures Wound Margin: Thickened Exudate Medium Amount: Exudate Type: Serosanguineous Exudate Color: red, brown ibrino Yes Wound Bed Necrotic Amount: Large (67-100%) Exposed Structure Necrotic Quality: Eschar,  Adherent Slough Fascia Exposed: No Fat Layer (Subcutaneous Tissue) Exposed: Yes Tendon Exposed: No Muscle Exposed: No Joint Exposed: No [name], [name] ([id number]) Bone Exposed: No Electronic Signature(s) Signed: 01/19/2019 1:29:00 PM By: Gretta Cool, BSN, RN, CWS, Kim RN, BSN Entered By: Gretta Cool, BSN, RN, CWS, Kim on 01/15/2019 08:53:16 [name], [name]  Jerilynn Mages ([id number]) -------------------------------------------------------------------------------- Vitals Details Patient Name: [name], [name]. Date of Service: 01/15/2019 8:00 AM Medical Record Number: [id number] Patient Account Number: [id number] Date of Birth/Sex: 04/19/1934 (84 y.o. F) Treating RN: Cornell Barman Primary Care Manoah Deckard: Clarene Reamer Other Clinician: Referring Harpreet Signore: Referral, Self Treating Sullivan Blasing/Extender: Melburn Hake, HOYT Weeks in Treatment: 0 Vital Signs Time Taken: 08:30 Temperature (F): 98.3 Height (in): 66 Pulse (bpm): 59 Weight (lbs): 165 Respiratory Rate (breaths/min): 16 Body Mass Index (BMI): 26.6 Blood Pressure (mmHg): 124/42 Reference Range: 80 - 120 mg / dl Notes Patient states she feels a little lightheaded. Manual BP 126/58 Electronic Signature(s) Signed: 01/19/2019 1:29:00 PM By: Gretta Cool, BSN, RN, CWS, Kim RN, BSN Entered By: Gretta Cool, BSN, RN, CWS, Kim on 01/15/2019 08:32:36

## 2019-01-20 ENCOUNTER — Other Ambulatory Visit: Payer: Self-pay

## 2019-01-20 ENCOUNTER — Encounter (HOSPITAL_COMMUNITY): Payer: Self-pay

## 2019-01-20 ENCOUNTER — Ambulatory Visit (HOSPITAL_COMMUNITY)
Admission: RE | Admit: 2019-01-20 | Discharge: 2019-01-20 | Disposition: A | Discharge status: Home / Self Care | Payer: PPO | Source: Ambulatory Visit | Attending: Cardiology | Admitting: Cardiology

## 2019-01-20 VITALS — BP 146/70 | HR 67 | Wt 161.8 lb

## 2019-01-20 DIAGNOSIS — I130 Hypertensive heart and chronic kidney disease with heart failure and stage 1 through stage 4 chronic kidney disease, or unspecified chronic kidney disease: Secondary | ICD-10-CM | POA: Insufficient documentation

## 2019-01-20 DIAGNOSIS — I2510 Atherosclerotic heart disease of native coronary artery without angina pectoris: Secondary | ICD-10-CM | POA: Insufficient documentation

## 2019-01-20 DIAGNOSIS — Z7901 Long term (current) use of anticoagulants: Secondary | ICD-10-CM | POA: Diagnosis not present

## 2019-01-20 DIAGNOSIS — Z8249 Family history of ischemic heart disease and other diseases of the circulatory system: Secondary | ICD-10-CM | POA: Diagnosis not present

## 2019-01-20 DIAGNOSIS — Z95810 Presence of automatic (implantable) cardiac defibrillator: Secondary | ICD-10-CM | POA: Insufficient documentation

## 2019-01-20 DIAGNOSIS — I482 Chronic atrial fibrillation, unspecified: Secondary | ICD-10-CM | POA: Diagnosis not present

## 2019-01-20 DIAGNOSIS — I428 Other cardiomyopathies: Secondary | ICD-10-CM | POA: Insufficient documentation

## 2019-01-20 DIAGNOSIS — I272 Pulmonary hypertension, unspecified: Secondary | ICD-10-CM | POA: Diagnosis not present

## 2019-01-20 DIAGNOSIS — J449 Chronic obstructive pulmonary disease, unspecified: Secondary | ICD-10-CM | POA: Diagnosis not present

## 2019-01-20 DIAGNOSIS — Z87891 Personal history of nicotine dependence: Secondary | ICD-10-CM | POA: Insufficient documentation

## 2019-01-20 DIAGNOSIS — Z79899 Other long term (current) drug therapy: Secondary | ICD-10-CM | POA: Insufficient documentation

## 2019-01-20 DIAGNOSIS — I5033 Acute on chronic diastolic (congestive) heart failure: Secondary | ICD-10-CM | POA: Diagnosis not present

## 2019-01-20 DIAGNOSIS — E7800 Pure hypercholesterolemia, unspecified: Secondary | ICD-10-CM | POA: Insufficient documentation

## 2019-01-20 DIAGNOSIS — Z85528 Personal history of other malignant neoplasm of kidney: Secondary | ICD-10-CM | POA: Diagnosis not present

## 2019-01-20 DIAGNOSIS — I5032 Chronic diastolic (congestive) heart failure: Secondary | ICD-10-CM | POA: Diagnosis not present

## 2019-01-20 DIAGNOSIS — I4821 Permanent atrial fibrillation: Secondary | ICD-10-CM | POA: Diagnosis not present

## 2019-01-20 DIAGNOSIS — N183 Chronic kidney disease, stage 3 (moderate): Secondary | ICD-10-CM | POA: Insufficient documentation

## 2019-01-20 DIAGNOSIS — Z885 Allergy status to narcotic agent status: Secondary | ICD-10-CM | POA: Diagnosis not present

## 2019-01-20 DIAGNOSIS — Z905 Acquired absence of kidney: Secondary | ICD-10-CM | POA: Insufficient documentation

## 2019-01-20 DIAGNOSIS — D509 Iron deficiency anemia, unspecified: Secondary | ICD-10-CM | POA: Diagnosis not present

## 2019-01-20 DIAGNOSIS — Z882 Allergy status to sulfonamides status: Secondary | ICD-10-CM | POA: Diagnosis not present

## 2019-01-20 LAB — BASIC METABOLIC PANEL
Anion gap: 9 (ref 5–15)
BUN: 38 mg/dL — ABNORMAL HIGH (ref 8–23)
CO2: 29 mmol/L (ref 22–32)
Calcium: 9.1 mg/dL (ref 8.9–10.3)
Chloride: 99 mmol/L (ref 98–111)
Creatinine, Ser: 1.42 mg/dL — ABNORMAL HIGH (ref 0.44–1.00)
GFR calc Af Amer: 40 mL/min — ABNORMAL LOW (ref >60)
GFR calc non Af Amer: 34 mL/min — ABNORMAL LOW (ref >60)
Glucose, Bld: 93 mg/dL (ref 70–99)
Potassium: 3.8 mmol/L (ref 3.5–5.1)
Sodium: 137 mmol/L (ref 135–145)

## 2019-01-20 LAB — MAGNESIUM: Magnesium: 2.3 mg/dL (ref 1.7–2.4)

## 2019-01-20 LAB — BRAIN NATRIURETIC PEPTIDE: B Natriuretic Peptide: 313.4 pg/mL — ABNORMAL HIGH (ref 0.0–100.0)

## 2019-01-20 MED ORDER — METOLAZONE 2.5 MG PO TABS: 2.5000 mg | ORAL_TABLET | ORAL | 3 refills | Status: DC

## 2019-01-20 NOTE — Patient Instructions (Addendum)
Lab work done today. We will notify you of any abnormal lab work. No news is good news!  INCREASE Metolazone to 2.5mg  every Thursday.  Please follow up with the [facility] in 3 weeks.  You have been scheduled for a cardiomems implant on 02/03/19 with Dr [name], please see attached letter for detailed instructions. Be sure to have COVID pre procedure testing done on 01/29/19.  At the [facility], you and your health needs are our priority. As part of our continuing mission to provide you with exceptional heart care, we have created designated Provider Care Teams. These Care Teams include your primary Cardiologist (physician) and Advanced Practice Providers (APPs- Physician Assistants and Nurse Practitioners) who all work together to provide you with the care you need, when you need it.   You may see any of the following providers on your designated Care Team at your next follow up: Marland Kitchen Dr Glori Bickers . Dr Loralie Champagne . Darrick Grinder, NP   Please be sure to bring in all your medications bottles to every appointment.

## 2019-01-20 NOTE — H&P (View-Only) (Signed)
01/20/2019 [name]   22-Dec-1933  [id number]  Primary Physician Elby Beck, FNP Primary Cardiologist: Virl Axe, MD  Electrophysiologist: None   Reason for Visit/CC: f/u for chronic diastolic HF and increased LEE  HPI: 85 y/o female, followed by Dr. [name]. Patient has history of permanent atrial fibrillation, chronic diastolic CHF, CKD stage 3, smoking/prior COPD, renal cell CA s/p right nephrectomy. She has a prior history of HFrEF with nonischemic cardiomyoapthy and had a Medtronic ICD placed.  However, subsequently her EF has recovered.  More recently, she has had symptomatic HFpEF.    She has had dyspnea and edema for "a long time," but it has been worse since January of this year. In 6/20, she had left hip ORIF after mechanical fall. Echo in 6/20 showed EF 60-65%, mild RV dilation with normal systolic function, PASP 93 mmHg.After she got home from the hospital stay post-ORIF, she noted marked peripheral edema. She was short of breath walking short distances. +orthopnea. She came back to the hospital on 11/04/18 and was admitted with volume overload.  She was diuresed aggressively and lost about 22 lbs.  RHC showed pulmonary venous hypertension.  PCWP tracing had prominent v-waves in absence of significant MR, suggesting significant diastolic dysfunction.   She has been noted to have significant Fe deficiency anemia, but FOBT was negative during 7/20 hospitalization.   Today she returns for HF follow up with her daughter. Overall feeling fair. Having ongoing leg edema with wound on her LLE. SOB with exertion. Denies PND/Orthopnea. Appetite ok. No fever or chills. Weight at home 160-165 pounds. Taking all medications.    Current Meds  Medication Sig  . acetaminophen (TYLENOL) 325 MG tablet Take 2 tablets (650 mg total) by mouth every 6 (six) hours as needed for mild pain or moderate pain (or Fever >/= 101).  . Ascorbic Acid (VITAMIN C) 100 MG tablet Take 100 mg  by mouth daily.  . cetirizine (ZYRTEC) 10 MG tablet Take 10 mg by mouth daily.   Marland Kitchen conjugated estrogens (PREMARIN) vaginal cream Place 1 Applicatorful vaginally daily. (Patient taking differently: Place 1 Applicatorful vaginally daily as needed (vaginal dryness). )  . CRANBERRY PO Take 4 tablets by mouth daily.  Marland Kitchen docusate sodium (COLACE) 100 MG capsule Take 1 capsule (100 mg total) by mouth 2 (two) times daily. (Patient taking differently: Take 100 mg by mouth 2 (two) times daily as needed. )  . ezetimibe (ZETIA) 10 MG tablet Take 1 tablet (10 mg total) by mouth daily.  . ferrous sulfate 325 (65 FE) MG tablet Take 325 mg by mouth daily with breakfast.  . fluticasone (FLONASE) 50 MCG/ACT nasal spray Place 2 sprays into both nostrils daily. (Patient taking differently: Place 2 sprays into both nostrils daily as needed for allergies or rhinitis. )  . hydrocortisone 2.5 % cream Apply 1 application topically daily as needed (itching).   Marland Kitchen levothyroxine (SYNTHROID) 25 MCG tablet Take 1 tablet (25 mcg total) by mouth daily before breakfast.  . magnesium oxide (MAG-OX) 400 MG tablet Take 1 tablet (400 mg total) by mouth daily.  . metolazone (ZAROXOLYN) 2.5 MG tablet Take 1 tablet (2.5 mg total) by mouth as directed.  . pediatric multivitamin-iron (POLY-VI-SOL WITH IRON) 15 MG chewable tablet Chew 1 tablet by mouth daily.  . potassium chloride SA (K-DUR) 20 MEQ tablet Take 2 tablets (40 mEq total) by mouth every morning AND 1 tablet (20 mEq total) at bedtime. Take 1 tablet (20 meq) by mouth twice  daily, except on metolazone days take 2 tablets (40 meq) twice daily.  . rosuvastatin (CRESTOR) 40 MG tablet Take 1 tablet (40 mg total) by mouth daily.  Marland Kitchen spironolactone (ALDACTONE) 25 MG tablet Take 1 tablet (25 mg total) by mouth daily.  Marland Kitchen torsemide (DEMADEX) 20 MG tablet Take 4 tablets (80 mg total) by mouth 2 (two) times daily.  Marland Kitchen warfarin (COUMADIN) 2 MG tablet Take 1 tablet (2 mg total) by mouth as directed.  Take 2 mg on Monday- Saturday and 1 Mg on Sunday (Patient taking differently: Take 1-2 mg by mouth See admin instructions. Take 2 mg on Monday- Saturday and 1 Mg on Sunday)   Allergies  Allergen Reactions  . Dilaudid [Hydromorphone] Other (See Comments)    Excessive Somnolence  Needing Narcan.  . Codeine Nausea And Vomiting  . Morphine And Related Nausea And Vomiting  . Sulfonamide Derivatives Other (See Comments)    Dry mouth   Past Medical History:  Diagnosis Date  . Adjustment disorder with anxiety   . Anginal pain ([place])   . Arthritis    "some in my hands" (11/11/2012)  . Atrial fibrillation ([place])   . Automatic implantable cardioverter-defibrillator in situ   . Blood in stool   . Carotid artery stenosis 09/2007   a. 09/2007: 60-79% bilateral (stable); b. 10/2008: 40-59% R 60-79%   . Chest pain, unspecified   . Chronic airway obstruction, not elsewhere classified   . Chronic bronchitis ([place])    "get it some; not q year" (11/11/2012)  . Chronic diastolic CHF (congestive heart failure) ([place]) 06/04/2013  . Chronic kidney disease, unspecified   . Coronary artery disease    non-obstructive by 2006 cath  . Frequent UTI    "get them a couple times/yr" (11/11/2012)  . Hemorrhage of rectum and anus   . High cholesterol   . History of blood transfusion 04/2011   "after hip OR" (11/11/2012)  . Hypertension 05/20/2011  . Hypertr obst cardiomyop   . Hypotension, unspecified    cardiac cath 2006..nonobstructive CAD 30-40s lesions.Marland KitchenETT 1/09 nondiagnostic due to poor HR response..Right Renal Cancer 2003  . Long term (current) use of anticoagulants   . Malignant neoplasm of kidney, except pelvis   . Osteoarthritis of right hip   . Other and unspecified coagulation defects   . PONV (postoperative nausea and vomiting)   . Renal cancer ([place]) 06/2001   Right  . Secondary cardiomyopathy, unspecified   . Sinus bradycardia   . Urge incontinence    Family History  Problem Relation Age of Onset  .  Heart failure Mother   . Breast cancer Maternal Aunt 82  . Breast cancer Cousin   . Breast cancer Other   . Colon cancer Neg Hx    Past Surgical History:  Procedure Laterality Date  . ABDOMINAL HYSTERECTOMY  1975   for benign causes  . APPENDECTOMY    . BI-VENTRICULAR PACEMAKER UPGRADE  05/04/2010  . CARDIAC CATHETERIZATION  2006  . CARDIOVERSION N/A 02/20/2018   Procedure: CARDIOVERSION;  Surgeon: Minna Merritts, MD;  Location: [facility];  Service: Cardiovascular;  Laterality: N/A;  . CARDIOVERSION N/A 03/27/2018   Procedure: CARDIOVERSION;  Surgeon: Minna Merritts, MD;  Location: [facility];  Service: Cardiovascular;  Laterality: N/A;  . CATARACT EXTRACTION W/ INTRAOCULAR LENS  IMPLANT, BILATERAL  01/2006-02-2006  . CHOLECYSTECTOMY N/A 11/11/2012   Procedure: LAPAROSCOPIC CHOLECYSTECTOMY WITH INTRAOPERATIVE CHOLANGIOGRAM;  Surgeon: Imogene Burn. Georgette Dover, MD;  Location: [facility];  Service: General;  Laterality:  N/A;  . EP IMPLANTABLE DEVICE N/A 02/21/2016   Procedure: ICD Generator Changeout;  Surgeon: Deboraha Sprang, MD;  Location: [facility];  Service: Cardiovascular;  Laterality: N/A;  . INSERT / REPLACE / REMOVE PACEMAKER  05-01-11   02-28-05-/05-04-10-ICD-MEDTRONIC MAXIMAL DR  . JOINT REPLACEMENT    . LAPAROSCOPIC CHOLECYSTECTOMY  11/11/2012  . LAPAROSCOPIC LYSIS OF ADHESIONS N/A 11/11/2012   Procedure: LAPAROSCOPIC LYSIS OF ADHESIONS;  Surgeon: Imogene Burn. Georgette Dover, MD;  Location: [facility];  Service: General;  Laterality: N/A;  . NEPHRECTOMY Right 06/2001    S/P RENAL CELL CANCER  . RIGHT HEART CATH N/A 11/09/2018   Procedure: RIGHT HEART CATH;  Surgeon: Larey Dresser, MD;  Location: [facility];  Service: Cardiovascular;  Laterality: N/A;  . TOTAL HIP ARTHROPLASTY Right 05/03/2011   Procedure: TOTAL HIP ARTHROPLASTY ANTERIOR APPROACH;  Surgeon: Mcarthur Rossetti;  Location: [facility];  Service: Orthopedics;  Laterality: Right;  Removal of Cannulated Screws Right Hip, Right  Direct Anterior Hip Replacement  . TOTAL HIP ARTHROPLASTY Left 10/09/2018   Procedure: TOTAL HIP ARTHROPLASTY ANTERIOR APPROACH;  Surgeon: Rod Can, MD;  Location: [facility];  Service: Orthopedics;  Laterality: Left;   Social History   Socioeconomic History  . Marital status: Widowed    Spouse name: Not on file  . Number of children: Not on file  . Years of education: Not on file  . Highest education level: Not on file  Occupational History  . Occupation: Retired    Fish farm manager: RETIRED  Social Needs  . Financial resource strain: Not on file  . Food insecurity    Worry: Not on file    Inability: Not on file  . Transportation needs    Medical: Not on file    Non-medical: Not on file  Tobacco Use  . Smoking status: Former Smoker    Packs/day: 0.50    Years: 40.00    Pack years: 20.00    Types: Cigarettes    Quit date: 05/06/2001    Years since quitting: 17.7  . Smokeless tobacco: Never Used  Substance and Sexual Activity  . Alcohol use: No    Alcohol/week: 0.0 standard drinks  . Drug use: No  . Sexual activity: Never  Lifestyle  . Physical activity    Days per week: Not on file    Minutes per session: Not on file  . Stress: Not on file  Relationships  . Social Herbalist on phone: Not on file    Gets together: Not on file    Attends religious service: Not on file    Active member of club or organization: Not on file    Attends meetings of clubs or organizations: Not on file    Relationship status: Not on file  . Intimate partner violence    Fear of current or ex partner: Not on file    Emotionally abused: Not on file    Physically abused: Not on file    Forced sexual activity: Not on file  Other Topics Concern  . 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   ALCOHOL USE-NO   RETIRED   FORMER  TOBACCO USE.....1/2 PACK X 40 YRS UNTIL 2003   Would desire CPR     Lipid Panel  Component Value Date/Time   CHOL 179 03/19/2016 1123   CHOL 271 (H) 06/09/2014 1317   TRIG 110.0 03/19/2016 1123   TRIG 241 (H) 06/09/2014 1317   HDL 39.10 03/19/2016 1123   HDL 40 06/09/2014 1317   CHOLHDL 5 03/19/2016 1123   VLDL 22.0 03/19/2016 1123   VLDL 48 (H) 06/09/2014 1317   LDLCALC 118 (H) 03/19/2016 1123   LDLCALC 183 (H) 06/09/2014 1317   LDLDIRECT 146.0 11/07/2016 1128    Physical Exam:  Blood pressure (!) 146/70, pulse 67, weight 73.4 kg (161 lb 12.8 oz), SpO2 100 %.  Wt Readings from Last 3 Encounters:  01/20/19 73.4 kg (161 lb 12.8 oz)  01/06/19 75.1 kg (165 lb 9.6 oz)  12/29/18 73 kg (161 lb)    General:   No resp difficulty. Arrived in wheelchair.  HEENT: normal Neck: supple. JVD 9-10 . Carotids 2+ bilat; no bruits. No lymphadenopathy or thryomegaly appreciated. Cor: PMI nondisplaced. Irregular rate & rhythm. No rubs, gallops or murmurs. Lungs: clear Abdomen: soft, nontender, nondistended. No hepatosplenomegaly. No bruits or masses. Good bowel sounds. Extremities: no cyanosis, clubbing, rash, LLE dressing in place. R and LLE 1-2+ edema Neuro: alert & orientedx3, cranial nerves grossly intact. moves all 4 extremities w/o difficulty. Affect pleasant  EKG: A fib 67 bpm personally reviewed.   ASSESSMENT AND PLAN:   1. Chronic diastolic CHF: Echo in 8/88 showed EF 60-65%, mild RV dilation with normal systolic function, PASP 93 mmHg. She had remote nonischemic cardiomyopathy with recovery of EF, has Medtronic ICD. Suspect that there is significant RV dysfunction. Atrial fibrillation likely contributes, but this appears to be permanent now as she has failed multiple anti-arrhythmics and is reasonably rate-controlled. RHC7/6/20showed mild-moderately elevated PCWP and RA pressure, primarily pulmonary venous hypertension. Prominent v-waves on PCWP tracing may be due to stiff  ventricle/diastolic dysfunction as she had only mild MR on recent echo.  - NYHA IIIb. Volume status elevated. Continue torsemide  80 mg twice a day and add metolazone every Thursday.   - Continue  spironolactone to 25 mg daily.  - She would to pursue cardiomems. She was set up [facility] on 02/03/19   2. Pulmonary hypertension: PA systolic pressure estimated 93 mmHg on echo. Based on RHC7/6/20, she has primarily pulmonary venous hypertension.   3. CKD: Stage 3.Check BMET today.  4. Anemia: Fe deficiency. 7/20 admission, she got IV Fe.   5. Atrial fibrillation: Permanent now.  Rate controlled.  She has failed amiodarone, Tikosyn, and flecainide as well as multiple cardioversions.  -Continue warfarin.  - h/o falls, 2 in the last 6 months. If recurrent falls, will need to re weigh risk vs benefit - advised to seek medical attention if fall w/ head injury    Set up for Cardiomems with provided pre procedure education.  Greater than 50% of the (total minutes 40) visit spent in counseling/coordination of care regarding the above.   Goku Harb NP-C  2:10 PM

## 2019-01-20 NOTE — Progress Notes (Signed)
01/20/2019 [name]   Jun 09, 1933  [id number]  Primary Physician Elby Beck, FNP Primary Cardiologist: Virl Axe, MD  Electrophysiologist: None   Reason for Visit/CC: f/u for chronic diastolic HF and increased LEE  HPI: 85 y/o female, followed by Dr. [name]. Patient has history of permanent atrial fibrillation, chronic diastolic CHF, CKD stage 3, smoking/prior COPD, renal cell CA s/p right nephrectomy. She has a prior history of HFrEF with nonischemic cardiomyoapthy and had a Medtronic ICD placed.  However, subsequently her EF has recovered.  More recently, she has had symptomatic HFpEF.    She has had dyspnea and edema for "a long time," but it has been worse since January of this year. In 6/20, she had left hip ORIF after mechanical fall. Echo in 6/20 showed EF 60-65%, mild RV dilation with normal systolic function, PASP 93 mmHg.After she got home from the hospital stay post-ORIF, she noted marked peripheral edema. She was short of breath walking short distances. +orthopnea. She came back to the hospital on 11/04/18 and was admitted with volume overload.  She was diuresed aggressively and lost about 22 lbs.  RHC showed pulmonary venous hypertension.  PCWP tracing had prominent v-waves in absence of significant MR, suggesting significant diastolic dysfunction.   She has been noted to have significant Fe deficiency anemia, but FOBT was negative during 7/20 hospitalization.   Today she returns for HF follow up with her daughter. Overall feeling fair. Having ongoing leg edema with wound on her LLE. SOB with exertion. Denies PND/Orthopnea. Appetite ok. No fever or chills. Weight at home 160-165 pounds. Taking all medications.    Current Meds  Medication Sig  . acetaminophen (TYLENOL) 325 MG tablet Take 2 tablets (650 mg total) by mouth every 6 (six) hours as needed for mild pain or moderate pain (or Fever >/= 101).  . Ascorbic Acid (VITAMIN C) 100 MG tablet Take 100 mg  by mouth daily.  . cetirizine (ZYRTEC) 10 MG tablet Take 10 mg by mouth daily.   Marland Kitchen conjugated estrogens (PREMARIN) vaginal cream Place 1 Applicatorful vaginally daily. (Patient taking differently: Place 1 Applicatorful vaginally daily as needed (vaginal dryness). )  . CRANBERRY PO Take 4 tablets by mouth daily.  Marland Kitchen docusate sodium (COLACE) 100 MG capsule Take 1 capsule (100 mg total) by mouth 2 (two) times daily. (Patient taking differently: Take 100 mg by mouth 2 (two) times daily as needed. )  . ezetimibe (ZETIA) 10 MG tablet Take 1 tablet (10 mg total) by mouth daily.  . ferrous sulfate 325 (65 FE) MG tablet Take 325 mg by mouth daily with breakfast.  . fluticasone (FLONASE) 50 MCG/ACT nasal spray Place 2 sprays into both nostrils daily. (Patient taking differently: Place 2 sprays into both nostrils daily as needed for allergies or rhinitis. )  . hydrocortisone 2.5 % cream Apply 1 application topically daily as needed (itching).   Marland Kitchen levothyroxine (SYNTHROID) 25 MCG tablet Take 1 tablet (25 mcg total) by mouth daily before breakfast.  . magnesium oxide (MAG-OX) 400 MG tablet Take 1 tablet (400 mg total) by mouth daily.  . metolazone (ZAROXOLYN) 2.5 MG tablet Take 1 tablet (2.5 mg total) by mouth as directed.  . pediatric multivitamin-iron (POLY-VI-SOL WITH IRON) 15 MG chewable tablet Chew 1 tablet by mouth daily.  . potassium chloride SA (K-DUR) 20 MEQ tablet Take 2 tablets (40 mEq total) by mouth every morning AND 1 tablet (20 mEq total) at bedtime. Take 1 tablet (20 meq) by mouth twice  daily, except on metolazone days take 2 tablets (40 meq) twice daily.  . rosuvastatin (CRESTOR) 40 MG tablet Take 1 tablet (40 mg total) by mouth daily.  Marland Kitchen spironolactone (ALDACTONE) 25 MG tablet Take 1 tablet (25 mg total) by mouth daily.  Marland Kitchen torsemide (DEMADEX) 20 MG tablet Take 4 tablets (80 mg total) by mouth 2 (two) times daily.  Marland Kitchen warfarin (COUMADIN) 2 MG tablet Take 1 tablet (2 mg total) by mouth as directed.  Take 2 mg on Monday- Saturday and 1 Mg on Sunday (Patient taking differently: Take 1-2 mg by mouth See admin instructions. Take 2 mg on Monday- Saturday and 1 Mg on Sunday)   Allergies  Allergen Reactions  . Dilaudid [Hydromorphone] Other (See Comments)    Excessive Somnolence  Needing Narcan.  . Codeine Nausea And Vomiting  . Morphine And Related Nausea And Vomiting  . Sulfonamide Derivatives Other (See Comments)    Dry mouth   Past Medical History:  Diagnosis Date  . Adjustment disorder with anxiety   . Anginal pain ([place])   . Arthritis    "some in my hands" (11/11/2012)  . Atrial fibrillation ([place])   . Automatic implantable cardioverter-defibrillator in situ   . Blood in stool   . Carotid artery stenosis 09/2007   a. 09/2007: 60-79% bilateral (stable); b. 10/2008: 40-59% R 60-79%   . Chest pain, unspecified   . Chronic airway obstruction, not elsewhere classified   . Chronic bronchitis ([place])    "get it some; not q year" (11/11/2012)  . Chronic diastolic CHF (congestive heart failure) ([place]) 06/04/2013  . Chronic kidney disease, unspecified   . Coronary artery disease    non-obstructive by 2006 cath  . Frequent UTI    "get them a couple times/yr" (11/11/2012)  . Hemorrhage of rectum and anus   . High cholesterol   . History of blood transfusion 04/2011   "after hip OR" (11/11/2012)  . Hypertension 05/20/2011  . Hypertr obst cardiomyop   . Hypotension, unspecified    cardiac cath 2006..nonobstructive CAD 30-40s lesions.Marland KitchenETT 1/09 nondiagnostic due to poor HR response..Right Renal Cancer 2003  . Long term (current) use of anticoagulants   . Malignant neoplasm of kidney, except pelvis   . Osteoarthritis of right hip   . Other and unspecified coagulation defects   . PONV (postoperative nausea and vomiting)   . Renal cancer ([place]) 06/2001   Right  . Secondary cardiomyopathy, unspecified   . Sinus bradycardia   . Urge incontinence    Family History  Problem Relation Age of Onset  .  Heart failure Mother   . Breast cancer Maternal Aunt 82  . Breast cancer Cousin   . Breast cancer Other   . Colon cancer Neg Hx    Past Surgical History:  Procedure Laterality Date  . ABDOMINAL HYSTERECTOMY  1975   for benign causes  . APPENDECTOMY    . BI-VENTRICULAR PACEMAKER UPGRADE  05/04/2010  . CARDIAC CATHETERIZATION  2006  . CARDIOVERSION N/A 02/20/2018   Procedure: CARDIOVERSION;  Surgeon: Minna Merritts, MD;  Location: [facility];  Service: Cardiovascular;  Laterality: N/A;  . CARDIOVERSION N/A 03/27/2018   Procedure: CARDIOVERSION;  Surgeon: Minna Merritts, MD;  Location: [facility];  Service: Cardiovascular;  Laterality: N/A;  . CATARACT EXTRACTION W/ INTRAOCULAR LENS  IMPLANT, BILATERAL  01/2006-02-2006  . CHOLECYSTECTOMY N/A 11/11/2012   Procedure: LAPAROSCOPIC CHOLECYSTECTOMY WITH INTRAOPERATIVE CHOLANGIOGRAM;  Surgeon: Imogene Burn. Georgette Dover, MD;  Location: [facility];  Service: General;  Laterality:  N/A;  . EP IMPLANTABLE DEVICE N/A 02/21/2016   Procedure: ICD Generator Changeout;  Surgeon: Deboraha Sprang, MD;  Location: [facility];  Service: Cardiovascular;  Laterality: N/A;  . INSERT / REPLACE / REMOVE PACEMAKER  05-01-11   02-28-05-/05-04-10-ICD-MEDTRONIC MAXIMAL DR  . JOINT REPLACEMENT    . LAPAROSCOPIC CHOLECYSTECTOMY  11/11/2012  . LAPAROSCOPIC LYSIS OF ADHESIONS N/A 11/11/2012   Procedure: LAPAROSCOPIC LYSIS OF ADHESIONS;  Surgeon: Imogene Burn. Georgette Dover, MD;  Location: [facility];  Service: General;  Laterality: N/A;  . NEPHRECTOMY Right 06/2001    S/P RENAL CELL CANCER  . RIGHT HEART CATH N/A 11/09/2018   Procedure: RIGHT HEART CATH;  Surgeon: Larey Dresser, MD;  Location: [facility];  Service: Cardiovascular;  Laterality: N/A;  . TOTAL HIP ARTHROPLASTY Right 05/03/2011   Procedure: TOTAL HIP ARTHROPLASTY ANTERIOR APPROACH;  Surgeon: Mcarthur Rossetti;  Location[facility];  Service: Orthopedics;  Laterality: Right;  Removal of Cannulated Screws Right Hip, Right  Direct Anterior Hip Replacement  . TOTAL HIP ARTHROPLASTY Left 10/09/2018   Procedure: TOTAL HIP ARTHROPLASTY ANTERIOR APPROACH;  Surgeon: Rod Can, MD;  Location: [facility];  Service: Orthopedics;  Laterality: Left;   Social History   Socioeconomic History  . Marital status: Widowed    Spouse name: Not on file  . Number of children: Not on file  . Years of education: Not on file  . Highest education level: Not on file  Occupational History  . Occupation: Retired    Fish farm manager: RETIRED  Social Needs  . Financial resource strain: Not on file  . Food insecurity    Worry: Not on file    Inability: Not on file  . Transportation needs    Medical: Not on file    Non-medical: Not on file  Tobacco Use  . Smoking status: Former Smoker    Packs/day: 0.50    Years: 40.00    Pack years: 20.00    Types: Cigarettes    Quit date: 05/06/2001    Years since quitting: 17.7  . Smokeless tobacco: Never Used  Substance and Sexual Activity  . Alcohol use: No    Alcohol/week: 0.0 standard drinks  . Drug use: No  . Sexual activity: Never  Lifestyle  . Physical activity    Days per week: Not on file    Minutes per session: Not on file  . Stress: Not on file  Relationships  . Social Herbalist on phone: Not on file    Gets together: Not on file    Attends religious service: Not on file    Active member of club or organization: Not on file    Attends meetings of clubs or organizations: Not on file    Relationship status: Not on file  . Intimate partner violence    Fear of current or ex partner: Not on file    Emotionally abused: Not on file    Physically abused: Not on file    Forced sexual activity: Not on file  Other Topics Concern  . 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   ALCOHOL USE-NO   RETIRED   FORMER  TOBACCO USE.....1/2 PACK X 40 YRS UNTIL 2003   Would desire CPR     Lipid Panel  Component Value Date/Time   CHOL 179 03/19/2016 1123   CHOL 271 (H) 06/09/2014 1317   TRIG 110.0 03/19/2016 1123   TRIG 241 (H) 06/09/2014 1317   HDL 39.10 03/19/2016 1123   HDL 40 06/09/2014 1317   CHOLHDL 5 03/19/2016 1123   VLDL 22.0 03/19/2016 1123   VLDL 48 (H) 06/09/2014 1317   LDLCALC 118 (H) 03/19/2016 1123   LDLCALC 183 (H) 06/09/2014 1317   LDLDIRECT 146.0 11/07/2016 1128    Physical Exam:  Blood pressure (!) 146/70, pulse 67, weight 73.4 kg (161 lb 12.8 oz), SpO2 100 %.  Wt Readings from Last 3 Encounters:  01/20/19 73.4 kg (161 lb 12.8 oz)  01/06/19 75.1 kg (165 lb 9.6 oz)  12/29/18 73 kg (161 lb)    General:   No resp difficulty. Arrived in wheelchair.  HEENT: normal Neck: supple. JVD 9-10 . Carotids 2+ bilat; no bruits. No lymphadenopathy or thryomegaly appreciated. Cor: PMI nondisplaced. Irregular rate & rhythm. No rubs, gallops or murmurs. Lungs: clear Abdomen: soft, nontender, nondistended. No hepatosplenomegaly. No bruits or masses. Good bowel sounds. Extremities: no cyanosis, clubbing, rash, LLE dressing in place. R and LLE 1-2+ edema Neuro: alert & orientedx3, cranial nerves grossly intact. moves all 4 extremities w/o difficulty. Affect pleasant  EKG: A fib 67 bpm personally reviewed.   ASSESSMENT AND PLAN:   1. Chronic diastolic CHF: Echo in 1/66 showed EF 60-65%, mild RV dilation with normal systolic function, PASP 93 mmHg. She had remote nonischemic cardiomyopathy with recovery of EF, has Medtronic ICD. Suspect that there is significant RV dysfunction. Atrial fibrillation likely contributes, but this appears to be permanent now as she has failed multiple anti-arrhythmics and is reasonably rate-controlled. RHC7/6/20showed mild-moderately elevated PCWP and RA pressure, primarily pulmonary venous hypertension. Prominent v-waves on PCWP tracing may be due to stiff  ventricle/diastolic dysfunction as she had only mild MR on recent echo.  - NYHA IIIb. Volume status elevated. Continue torsemide  80 mg twice a day and add metolazone every Thursday.   - Continue  spironolactone to 25 mg daily.  - She would to pursue cardiomems. She was set up [facility] on 02/03/19   2. Pulmonary hypertension: PA systolic pressure estimated 93 mmHg on echo. Based on RHC7/6/20, she has primarily pulmonary venous hypertension.   3. CKD: Stage 3.Check BMET today.  4. Anemia: Fe deficiency. 7/20 admission, she got IV Fe.   5. Atrial fibrillation: Permanent now.  Rate controlled.  She has failed amiodarone, Tikosyn, and flecainide as well as multiple cardioversions.  -Continue warfarin.  - h/o falls, 2 in the last 6 months. If recurrent falls, will need to re weigh risk vs benefit - advised to seek medical attention if fall w/ head injury    Set up for Cardiomems with provided pre procedure education.  Greater than 50% of the (total minutes 40) visit spent in counseling/coordination of care regarding the above.   Babygirl Trager NP-C  2:10 PM

## 2019-01-21 ENCOUNTER — Ambulatory Visit: Payer: PPO

## 2019-01-22 ENCOUNTER — Telehealth: Payer: Self-pay | Admitting: Hematology

## 2019-01-22 ENCOUNTER — Ambulatory Visit: Payer: PPO | Admitting: Physician Assistant

## 2019-01-22 NOTE — Telephone Encounter (Signed)
Returned patient's phone call regarding 09/21 appointment, per patient's request appointment has moved to 10/12.

## 2019-01-25 ENCOUNTER — Inpatient Hospital Stay: Payer: PPO | Admitting: Hematology

## 2019-01-25 NOTE — Progress Notes (Deleted)
01/26/2019 11:29 AM   [name] [birth year] [id number]  Referring provider: Elby Beck, [address] [place],  [place] [id number]  No chief complaint on file.   HPI: 85 year old female with recurrent urinary tract infections who returns today for routine follow-up.  rUTI's risk factors: age, vaginal atrophy and incontinence.    RUS in 2018 revealed post right nephrectomy.  Left kidney within normal limits.  Cystoscopy in 06/2017 with Dr. Erlene Quan was negative.    + E. Coli resistant to ampicillin in 09/2018 + E. Coli resistant to ampicillin in 07/2018 + E. Coli pansensitive in 03/2018  She does a personal history of kidney cancer status post nephrectomy 2003 without recurrence.   PMH: Past Medical History:  Diagnosis Date  . Adjustment disorder with anxiety   . Anginal pain ([place])   . Arthritis    "some in my hands" (11/11/2012)  . Atrial fibrillation ([place])   . Automatic implantable cardioverter-defibrillator in situ   . Blood in stool   . Carotid artery stenosis 09/2007   a. 09/2007: 60-79% bilateral (stable); b. 10/2008: 40-59% R 60-79%   . Chest pain, unspecified   . Chronic airway obstruction, not elsewhere classified   . Chronic bronchitis ([place])    "get it some; not q year" (11/11/2012)  . Chronic diastolic CHF (congestive heart failure) ([place]) 06/04/2013  . Chronic kidney disease, unspecified   . Coronary artery disease    non-obstructive by 2006 cath  . Frequent UTI    "get them a couple times/yr" (11/11/2012)  . Hemorrhage of rectum and anus   . High cholesterol   . History of blood transfusion 04/2011   "after hip OR" (11/11/2012)  . Hypertension 05/20/2011  . Hypertr obst cardiomyop   . Hypotension, unspecified    cardiac cath 2006..nonobstructive CAD 30-40s lesions.Marland KitchenETT 1/09 nondiagnostic due to poor HR response..Right Renal Cancer 2003  . Long term (current) use of anticoagulants   . Malignant neoplasm of kidney, except pelvis   .  Osteoarthritis of right hip   . Other and unspecified coagulation defects   . PONV (postoperative nausea and vomiting)   . Renal cancer ([place]) 06/2001   Right  . Secondary cardiomyopathy, unspecified   . Sinus bradycardia   . Urge incontinence     Surgical History: Past Surgical History:  Procedure Laterality Date  . ABDOMINAL HYSTERECTOMY  1975   for benign causes  . APPENDECTOMY    . BI-VENTRICULAR PACEMAKER UPGRADE  05/04/2010  . CARDIAC CATHETERIZATION  2006  . CARDIOVERSION N/A 02/20/2018   Procedure: CARDIOVERSION;  Surgeon: Minna Merritts, MD;  Location: [facility];  Service: Cardiovascular;  Laterality: N/A;  . CARDIOVERSION N/A 03/27/2018   Procedure: CARDIOVERSION;  Surgeon: Minna Merritts, MD;  Location: [facility];  Service: Cardiovascular;  Laterality: N/A;  . CATARACT EXTRACTION W/ INTRAOCULAR LENS  IMPLANT, BILATERAL  01/2006-02-2006  . CHOLECYSTECTOMY N/A 11/11/2012   Procedure: LAPAROSCOPIC CHOLECYSTECTOMY WITH INTRAOPERATIVE CHOLANGIOGRAM;  Surgeon: Imogene Burn. Georgette Dover, MD;  Location: [facility];  Service: General;  Laterality: N/A;  . EP IMPLANTABLE DEVICE N/A 02/21/2016   Procedure: ICD Generator Changeout;  Surgeon: Deboraha Sprang, MD;  Location: [facility];  Service: Cardiovascular;  Laterality: N/A;  . INSERT / REPLACE / REMOVE PACEMAKER  05-01-11   02-28-05-/05-04-10-ICD-MEDTRONIC MAXIMAL DR  . JOINT REPLACEMENT    . LAPAROSCOPIC CHOLECYSTECTOMY  11/11/2012  . LAPAROSCOPIC LYSIS OF ADHESIONS N/A 11/11/2012   Procedure: LAPAROSCOPIC LYSIS OF ADHESIONS;  Surgeon:  Imogene Burn. Georgette Dover, MD;  Location: [facility];  Service: General;  Laterality: N/A;  . NEPHRECTOMY Right 06/2001    S/P RENAL CELL CANCER  . RIGHT HEART CATH N/A 11/09/2018   Procedure: RIGHT HEART CATH;  Surgeon: Larey Dresser, MD;  Location: [facility];  Service: Cardiovascular;  Laterality: N/A;  . TOTAL HIP ARTHROPLASTY Right 05/03/2011   Procedure: TOTAL HIP ARTHROPLASTY ANTERIOR APPROACH;  Surgeon:  Mcarthur Rossetti;  [facility];  Service: Orthopedics;  Laterality: Right;  Removal of Cannulated Screws Right Hip, Right Direct Anterior Hip Replacement  . TOTAL HIP ARTHROPLASTY Left 10/09/2018   Procedure: TOTAL HIP ARTHROPLASTY ANTERIOR APPROACH;  Surgeon: Rod Can, MD;  Location: [facility];  Service: Orthopedics;  Laterality: Left;    Home Medications:  Allergies as of 01/26/2019      Reactions   Dilaudid [hydromorphone] Other (See Comments)   Excessive Somnolence  Needing Narcan.   Codeine Nausea And Vomiting   Morphine And Related Nausea And Vomiting   Sulfonamide Derivatives Other (See Comments)   Dry mouth      Medication List       Accurate as of January 25, 2019 11:29 AM. If you have any questions, ask your nurse or doctor.        acetaminophen 325 MG tablet Commonly known as: TYLENOL Take 2 tablets (650 mg total) by mouth every 6 (six) hours as needed for mild pain or moderate pain (or Fever >/= 101).   bisacodyl 5 MG EC tablet Commonly known as: DULCOLAX Take 1 tablet (5 mg total) by mouth daily as needed for moderate constipation.   cetirizine 10 MG tablet Commonly known as: ZYRTEC Take 10 mg by mouth daily.   conjugated estrogens vaginal cream Commonly known as: Premarin Place 1 Applicatorful vaginally daily. What changed:   when to take this  reasons to take this   CRANBERRY PO Take 4 tablets by mouth daily.   docusate sodium 100 MG capsule Commonly known as: COLACE Take 1 capsule (100 mg total) by mouth 2 (two) times daily. What changed:   when to take this  reasons to take this   ezetimibe 10 MG tablet Commonly known as: ZETIA Take 1 tablet (10 mg total) by mouth daily.   ferrous sulfate 325 (65 FE) MG tablet Take 325 mg by mouth daily with breakfast.   fluticasone 50 MCG/ACT nasal spray Commonly known as: FLONASE Place 2 sprays into both nostrils daily. What changed:   when to take this  reasons to take this    hydrocortisone 2.5 % cream Apply 1 application topically daily as needed (itching).   levothyroxine 25 MCG tablet Commonly known as: SYNTHROID Take 1 tablet (25 mcg total) by mouth daily before breakfast.   magnesium oxide 400 MG tablet Commonly known as: MAG-OX Take 1 tablet (400 mg total) by mouth daily.   metolazone 2.5 MG tablet Commonly known as: ZAROXOLYN Take 1 tablet (2.5 mg total) by mouth as directed. Every Thursday   pediatric multivitamin-iron 15 MG chewable tablet Chew 1 tablet by mouth daily.   potassium chloride SA 20 MEQ tablet Commonly known as: K-DUR Take 2 tablets (40 mEq total) by mouth every morning AND 1 tablet (20 mEq total) at bedtime. Take 1 tablet (20 meq) by mouth twice daily, except on metolazone days take 2 tablets (40 meq) twice daily.   rosuvastatin 40 MG tablet Commonly known as: CRESTOR Take 1 tablet (40 mg total) by mouth daily.  spironolactone 25 MG tablet Commonly known as: ALDACTONE Take 1 tablet (25 mg total) by mouth daily.   torsemide 20 MG tablet Commonly known as: DEMADEX Take 4 tablets (80 mg total) by mouth 2 (two) times daily.   vitamin C 100 MG tablet Take 100 mg by mouth daily.   warfarin 2 MG tablet Commonly known as: COUMADIN Take as directed by the anticoagulation clinic. If you are unsure how to take this medication, talk to your nurse or doctor. Original instructions: Take 1 tablet (2 mg total) by mouth as directed. Take 2 mg on Monday- Saturday and 1 Mg on Sunday What changed:   how much to take  when to take this       Allergies:  Allergies  Allergen Reactions  . Dilaudid [Hydromorphone] Other (See Comments)    Excessive Somnolence  Needing Narcan.  . Codeine Nausea And Vomiting  . Morphine And Related Nausea And Vomiting  . Sulfonamide Derivatives Other (See Comments)    Dry mouth    Family History: Family History  Problem Relation Age of Onset  . Heart failure Mother   . Breast cancer Maternal  Aunt 82  . Breast cancer Cousin   . Breast cancer Other   . Colon cancer Neg Hx     Social History:  reports that she quit smoking about 17 years ago. Her smoking use included cigarettes. She has a 20.00 pack-year smoking history. She has never used smokeless tobacco. She reports that she does not drink alcohol or use drugs.  ROS:                                        Physical Exam: There were no vitals taken for this visit.  Constitutional:  Well nourished. Alert and oriented, No acute distress. HEENT: [place] AT, moist mucus membranes.  Trachea midline, no masses. Cardiovascular: No clubbing, cyanosis, or edema. Respiratory: Normal respiratory effort, no increased work of breathing. GI: Abdomen is soft, non tender, non distended, no abdominal masses. Liver and spleen not palpable.  No hernias appreciated.  Stool sample for occult testing is not indicated.   GU: No CVA tenderness.  No bladder fullness or masses.  *** external genitalia, *** pubic hair distribution, no lesions.  Normal urethral meatus, no lesions, no prolapse, no discharge.   No urethral masses, tenderness and/or tenderness. No bladder fullness, tenderness or masses. *** vagina mucosa, *** estrogen effect, no discharge, no lesions, *** pelvic support, *** cystocele and *** rectocele noted.  No cervical motion tenderness.  Uterus is freely mobile and non-fixed.  No adnexal/parametria masses or tenderness noted.  Anus and perineum are without rashes or lesions.   ***  Skin: No rashes, bruises or suspicious lesions. Lymph: No cervical or inguinal adenopathy. Neurologic: Grossly intact, no focal deficits, moving all 4 extremities. Psychiatric: Normal mood and affect.  Laboratory Data: Lab Results  Component Value Date   WBC 4.0 01/06/2019   HGB 9.0 (L) 01/06/2019   HCT 31.1 (L) 01/06/2019   MCV 86.1 01/06/2019   PLT 241 01/06/2019    Lab Results  Component Value Date   CREATININE 1.42 (H) 01/20/2019    Lab Results  Component Value Date   HGBA1C 6.1 (H) 08/17/2014    Urinalysis ***  Pertinent Imaging: n/a  Assessment & Plan:    1. Recurrent UTI Persistent/recurrent E. coli urinary tract infections Suspect chronic bacterial colonization  Catheterized specimen today sent for culture, will treat as needed Previously did not tolerate suppressive trimethoprim, consider suppressive Macrobid x3 months pending culture and sensitivity data Strongly encourage resumption of topical estrogen cream 3 times a week as previously prescribed, the patient reassured me she would do this and has a tube at home Advised to resume cranberry tablets twice daily Advised to resume daily probiotic Recognitions were written out for the patient on her discharge - Urinalysis, Complete - CULTURE, URINE COMPREHENSIVE  2. History of kidney cancer Remote history of kidney cancer without recurrence Most recent renal ultrasound without lesions Solitary kidney  3. Atrophic vaginitis As above   No follow-ups on file.  Zara Council, PA-C  [facility] [address], [place], [place] [address] [phone]

## 2019-01-26 ENCOUNTER — Encounter: Payer: Self-pay | Admitting: Urology

## 2019-01-26 ENCOUNTER — Ambulatory Visit: Payer: PPO | Admitting: Urology

## 2019-01-28 ENCOUNTER — Ambulatory Visit (INDEPENDENT_AMBULATORY_CARE_PROVIDER_SITE_OTHER): Payer: PPO | Admitting: General Practice

## 2019-01-28 DIAGNOSIS — Z01810 Encounter for preprocedural cardiovascular examination: Secondary | ICD-10-CM | POA: Diagnosis not present

## 2019-01-28 DIAGNOSIS — Z7901 Long term (current) use of anticoagulants: Secondary | ICD-10-CM | POA: Diagnosis not present

## 2019-01-28 LAB — POCT INR: INR: 1.9 — AB (ref 2.0–3.0)

## 2019-01-28 NOTE — Patient Instructions (Addendum)
Pre visit review using our clinic review tool, if applicable. No additional management support is needed unless otherwise documented below in the visit note.  Continue to take 2 mg daily except 3 mg on Wednesdays.  Please follow pt instructions.  Re-check on 10/8.   9/25 - Take last dose of coumadin until after procedure  10/1, 10/2 and 10/3 - Take 2 tablets  10/4 - Resume current dosage  10/8 - Re-check INR

## 2019-02-01 ENCOUNTER — Other Ambulatory Visit (HOSPITAL_COMMUNITY)
Admission: RE | Admit: 2019-02-01 | Discharge: 2019-02-01 | Disposition: A | Discharge status: Home / Self Care | Payer: PPO | Source: Ambulatory Visit | Attending: Cardiology | Admitting: Cardiology

## 2019-02-01 ENCOUNTER — Telehealth (INDEPENDENT_AMBULATORY_CARE_PROVIDER_SITE_OTHER): Payer: PPO | Admitting: Family Medicine

## 2019-02-01 ENCOUNTER — Ambulatory Visit: Payer: PPO | Admitting: Family Medicine

## 2019-02-01 DIAGNOSIS — N898 Other specified noninflammatory disorders of vagina: Secondary | ICD-10-CM | POA: Diagnosis not present

## 2019-02-01 DIAGNOSIS — Z01812 Encounter for preprocedural laboratory examination: Secondary | ICD-10-CM | POA: Insufficient documentation

## 2019-02-01 DIAGNOSIS — Z20828 Contact with and (suspected) exposure to other viral communicable diseases: Secondary | ICD-10-CM | POA: Diagnosis not present

## 2019-02-01 DIAGNOSIS — N949 Unspecified condition associated with female genital organs and menstrual cycle: Secondary | ICD-10-CM

## 2019-02-01 DIAGNOSIS — Z0289 Encounter for other administrative examinations: Secondary | ICD-10-CM

## 2019-02-01 LAB — SARS CORONAVIRUS 2 (TAT 6-24 HRS): SARS Coronavirus 2: NEGATIVE

## 2019-02-01 NOTE — Telephone Encounter (Signed)
Please call and see if there is anyway to get a urine sample? Someone could drop off in sterile container. Needs to be within 1 hour of collecting. I need to get a sample to do a urinalysis and culture prior to starting her on an antibiotic. Is home health still seeing her? They could collect one.

## 2019-02-01 NOTE — Telephone Encounter (Signed)
Loma Sousa, did she want to do a virtual visit today with Jackelyn Poling this afternoon? We can work her in if so

## 2019-02-01 NOTE — Telephone Encounter (Signed)
Patient stated that she is unable to make appointment today. She is unable to drive due to feeling weak. She is requesting a call back to discuss her appointment and UTI symptoms.     Appointment is still on schedule. If it need to be taken off let me know

## 2019-02-01 NOTE — Telephone Encounter (Signed)
ATC x 2 - busy signal Pt was made aware when we spoke earlier that we would plan on dropping off the urine unless told otherwise   Will call back to discuss previous medication

## 2019-02-01 NOTE — Telephone Encounter (Signed)
Pt is not able to get a urine sample to our office today - she is going to try tomorrow morning. I have added her to the schedule for 02/02/2019. Explained process to her regarding drop off - pt aware that she needs to leave the sample on the picnic tablet since she has gone for Covid testing -- if someone else brings the sample they may bring it in the back door.  Pt states that she is having some urinary discomfort, little discharge "gold in color", blood tinged at times. Pt reports also that she vaginal itching and lower abd discomfort. Pt states that she took an abx recently and feels that she may have a yeast infection instead of a UTI. Pt has taken Diflucan in the past but she has to check her INR x 5 days after taking it. Some lower/mid back pain. Denies fever.

## 2019-02-01 NOTE — Telephone Encounter (Signed)
Please call her and see if she is ok to wait until I can get her urine results tomorrow before sending in a treatment for yeast. Please ask her what antibiotic she took and for what? I can't see it in the record.

## 2019-02-02 ENCOUNTER — Other Ambulatory Visit: Payer: PPO

## 2019-02-02 ENCOUNTER — Other Ambulatory Visit: Payer: Self-pay | Admitting: *Deleted

## 2019-02-02 ENCOUNTER — Other Ambulatory Visit (HOSPITAL_COMMUNITY): Payer: PPO

## 2019-02-02 DIAGNOSIS — N949 Unspecified condition associated with female genital organs and menstrual cycle: Secondary | ICD-10-CM | POA: Diagnosis not present

## 2019-02-02 DIAGNOSIS — N898 Other specified noninflammatory disorders of vagina: Secondary | ICD-10-CM

## 2019-02-02 LAB — POCT URINALYSIS DIPSTICK
Bilirubin, UA: NEGATIVE
Blood, UA: NEGATIVE
Glucose, UA: NEGATIVE
Ketones, UA: NEGATIVE
Leukocytes, UA: NEGATIVE
Nitrite, UA: NEGATIVE
Protein, UA: NEGATIVE
Spec Grav, UA: 1.015 (ref 1.010–1.025)
Urobilinogen, UA: 0.2 E.U./dL
pH, UA: 6 (ref 5.0–8.0)

## 2019-02-02 NOTE — Telephone Encounter (Signed)
Pt dropped off urine this afternoon. U/A done and Urine culture sent.   Will send to Debbie to further advise.

## 2019-02-03 ENCOUNTER — Other Ambulatory Visit: Payer: Self-pay | Admitting: Family Medicine

## 2019-02-03 ENCOUNTER — Telehealth: Payer: Self-pay

## 2019-02-03 ENCOUNTER — Other Ambulatory Visit: Payer: Self-pay

## 2019-02-03 ENCOUNTER — Encounter (HOSPITAL_COMMUNITY)
Admission: RE | Disposition: A | Discharge status: Home / Self Care | Payer: Self-pay | Source: Home / Self Care | Attending: Cardiology

## 2019-02-03 ENCOUNTER — Ambulatory Visit (HOSPITAL_COMMUNITY)
Admission: RE | Admit: 2019-02-03 | Discharge: 2019-02-03 | Disposition: A | Discharge status: Home / Self Care | Payer: PPO | Attending: Cardiology | Admitting: Cardiology

## 2019-02-03 DIAGNOSIS — I272 Pulmonary hypertension, unspecified: Secondary | ICD-10-CM | POA: Insufficient documentation

## 2019-02-03 DIAGNOSIS — I5042 Chronic combined systolic (congestive) and diastolic (congestive) heart failure: Secondary | ICD-10-CM | POA: Insufficient documentation

## 2019-02-03 DIAGNOSIS — I509 Heart failure, unspecified: Secondary | ICD-10-CM

## 2019-02-03 DIAGNOSIS — I2510 Atherosclerotic heart disease of native coronary artery without angina pectoris: Secondary | ICD-10-CM | POA: Insufficient documentation

## 2019-02-03 DIAGNOSIS — D509 Iron deficiency anemia, unspecified: Secondary | ICD-10-CM | POA: Insufficient documentation

## 2019-02-03 DIAGNOSIS — Z79890 Hormone replacement therapy (postmenopausal): Secondary | ICD-10-CM | POA: Insufficient documentation

## 2019-02-03 DIAGNOSIS — Z87891 Personal history of nicotine dependence: Secondary | ICD-10-CM | POA: Diagnosis not present

## 2019-02-03 DIAGNOSIS — N183 Chronic kidney disease, stage 3 (moderate): Secondary | ICD-10-CM | POA: Diagnosis not present

## 2019-02-03 DIAGNOSIS — Z885 Allergy status to narcotic agent status: Secondary | ICD-10-CM | POA: Insufficient documentation

## 2019-02-03 DIAGNOSIS — Z8249 Family history of ischemic heart disease and other diseases of the circulatory system: Secondary | ICD-10-CM | POA: Insufficient documentation

## 2019-02-03 DIAGNOSIS — Z90710 Acquired absence of both cervix and uterus: Secondary | ICD-10-CM | POA: Diagnosis not present

## 2019-02-03 DIAGNOSIS — Z96643 Presence of artificial hip joint, bilateral: Secondary | ICD-10-CM | POA: Insufficient documentation

## 2019-02-03 DIAGNOSIS — I4821 Permanent atrial fibrillation: Secondary | ICD-10-CM | POA: Diagnosis not present

## 2019-02-03 DIAGNOSIS — Z882 Allergy status to sulfonamides status: Secondary | ICD-10-CM | POA: Diagnosis not present

## 2019-02-03 DIAGNOSIS — E7800 Pure hypercholesterolemia, unspecified: Secondary | ICD-10-CM | POA: Insufficient documentation

## 2019-02-03 DIAGNOSIS — Z905 Acquired absence of kidney: Secondary | ICD-10-CM | POA: Insufficient documentation

## 2019-02-03 DIAGNOSIS — I130 Hypertensive heart and chronic kidney disease with heart failure and stage 1 through stage 4 chronic kidney disease, or unspecified chronic kidney disease: Secondary | ICD-10-CM | POA: Diagnosis not present

## 2019-02-03 DIAGNOSIS — Z7901 Long term (current) use of anticoagulants: Secondary | ICD-10-CM | POA: Insufficient documentation

## 2019-02-03 DIAGNOSIS — Z79899 Other long term (current) drug therapy: Secondary | ICD-10-CM | POA: Insufficient documentation

## 2019-02-03 DIAGNOSIS — J449 Chronic obstructive pulmonary disease, unspecified: Secondary | ICD-10-CM | POA: Insufficient documentation

## 2019-02-03 DIAGNOSIS — I428 Other cardiomyopathies: Secondary | ICD-10-CM | POA: Diagnosis not present

## 2019-02-03 HISTORY — PX: PRESSURE SENSOR/CARDIOMEMS: CATH118258

## 2019-02-03 LAB — POCT I-STAT EG7
Acid-Base Excess: 4 mmol/L — ABNORMAL HIGH (ref 0.0–2.0)
Acid-Base Excess: 4 mmol/L — ABNORMAL HIGH (ref 0.0–2.0)
Bicarbonate: 29.5 mmol/L — ABNORMAL HIGH (ref 20.0–28.0)
Bicarbonate: 29.8 mmol/L — ABNORMAL HIGH (ref 20.0–28.0)
Calcium, Ion: 1.18 mmol/L (ref 1.15–1.40)
Calcium, Ion: 1.19 mmol/L (ref 1.15–1.40)
HCT: 29 % — ABNORMAL LOW (ref 36.0–46.0)
HCT: 29 % — ABNORMAL LOW (ref 36.0–46.0)
Hemoglobin: 9.9 g/dL — ABNORMAL LOW (ref 12.0–15.0)
Hemoglobin: 9.9 g/dL — ABNORMAL LOW (ref 12.0–15.0)
O2 Saturation: 65 %
O2 Saturation: 68 %
Potassium: 3.4 mmol/L — ABNORMAL LOW (ref 3.5–5.1)
Potassium: 3.4 mmol/L — ABNORMAL LOW (ref 3.5–5.1)
Sodium: 137 mmol/L (ref 135–145)
Sodium: 137 mmol/L (ref 135–145)
TCO2: 31 mmol/L (ref 22–32)
TCO2: 31 mmol/L (ref 22–32)
pCO2, Ven: 48.2 mmHg (ref 44.0–60.0)
pCO2, Ven: 48.6 mmHg (ref 44.0–60.0)
pH, Ven: 7.395 (ref 7.250–7.430)
pH, Ven: 7.395 (ref 7.250–7.430)
pO2, Ven: 34 mmHg (ref 32.0–45.0)
pO2, Ven: 36 mmHg (ref 32.0–45.0)

## 2019-02-03 LAB — PROTIME-INR
INR: 1.2 (ref 0.8–1.2)
Prothrombin Time: 15.4 seconds — ABNORMAL HIGH (ref 11.4–15.2)

## 2019-02-03 SURGERY — PRESSURE SENSOR/CARDIOMEMS
Anesthesia: LOCAL

## 2019-02-03 MED ORDER — ACETAMINOPHEN 325 MG PO TABS
ORAL_TABLET | ORAL | Status: AC
  Filled 2019-02-03: qty 2

## 2019-02-03 MED ORDER — LABETALOL HCL 5 MG/ML IV SOLN: 10.0000 mg | INTRAVENOUS | Status: DC | PRN

## 2019-02-03 MED ORDER — HYDRALAZINE HCL 20 MG/ML IJ SOLN: 10.0000 mg | INTRAMUSCULAR | Status: DC | PRN

## 2019-02-03 MED ORDER — FENTANYL CITRATE (PF) 100 MCG/2ML IJ SOLN
INTRAMUSCULAR | Status: AC
  Filled 2019-02-03: qty 2

## 2019-02-03 MED ORDER — SODIUM CHLORIDE 0.9% FLUSH: 3.0000 mL | INTRAVENOUS | Status: DC | PRN

## 2019-02-03 MED ORDER — HEPARIN (PORCINE) IN NACL 1000-0.9 UT/500ML-% IV SOLN
INTRAVENOUS | Status: AC
  Filled 2019-02-03: qty 500

## 2019-02-03 MED ORDER — SODIUM CHLORIDE 0.9% FLUSH: 3.0000 mL | Freq: Two times a day (BID) | INTRAVENOUS | Status: DC

## 2019-02-03 MED ORDER — LIDOCAINE HCL (PF) 1 % IJ SOLN
INTRAMUSCULAR | Status: DC | PRN
  Administered 2019-02-03: 15 mL

## 2019-02-03 MED ORDER — FLUCONAZOLE 150 MG PO TABS: 150.0000 mg | ORAL_TABLET | Freq: Once | ORAL | 0 refills | Status: AC

## 2019-02-03 MED ORDER — WARFARIN SODIUM 3 MG PO TABS
3.0000 mg | ORAL_TABLET | Freq: Once | ORAL | Status: DC
  Filled 2019-02-03: qty 1

## 2019-02-03 MED ORDER — SODIUM CHLORIDE 0.9 % IV SOLN: 250.0000 mL | INTRAVENOUS | Status: DC | PRN

## 2019-02-03 MED ORDER — MIDAZOLAM HCL 2 MG/2ML IJ SOLN
INTRAMUSCULAR | Status: DC | PRN
  Administered 2019-02-03: 1 mg via INTRAVENOUS

## 2019-02-03 MED ORDER — HEPARIN (PORCINE) IN NACL 1000-0.9 UT/500ML-% IV SOLN
INTRAVENOUS | Status: DC | PRN
  Administered 2019-02-03 (×3): 500 mL

## 2019-02-03 MED ORDER — ASPIRIN 81 MG PO CHEW: 81.0000 mg | CHEWABLE_TABLET | Freq: Every day | ORAL | Status: DC

## 2019-02-03 MED ORDER — LIDOCAINE HCL (PF) 1 % IJ SOLN
INTRAMUSCULAR | Status: AC
  Filled 2019-02-03: qty 30

## 2019-02-03 MED ORDER — FENTANYL CITRATE (PF) 100 MCG/2ML IJ SOLN
INTRAMUSCULAR | Status: DC | PRN
  Administered 2019-02-03: 25 ug via INTRAVENOUS

## 2019-02-03 MED ORDER — ONDANSETRON HCL 4 MG/2ML IJ SOLN: 4.0000 mg | Freq: Four times a day (QID) | INTRAMUSCULAR | Status: DC | PRN

## 2019-02-03 MED ORDER — ASPIRIN 81 MG PO CHEW: 81.0000 mg | CHEWABLE_TABLET | Freq: Every day | ORAL | 11 refills | Status: DC

## 2019-02-03 MED ORDER — WARFARIN - PHARMACIST DOSING INPATIENT: Freq: Every day | Status: DC

## 2019-02-03 MED ORDER — IOHEXOL 350 MG/ML SOLN
INTRAVENOUS | Status: DC | PRN
  Administered 2019-02-03: 50 mL

## 2019-02-03 MED ORDER — TORSEMIDE 20 MG PO TABS: 100.0000 mg | ORAL_TABLET | Freq: Two times a day (BID) | ORAL | 3 refills | Status: DC

## 2019-02-03 MED ORDER — SODIUM CHLORIDE 0.9 % IV SOLN
INTRAVENOUS | Status: DC
  Administered 2019-02-03: 12:00:00 via INTRAVENOUS

## 2019-02-03 MED ORDER — ACETAMINOPHEN 325 MG PO TABS
650.0000 mg | ORAL_TABLET | ORAL | Status: DC | PRN
  Administered 2019-02-03: 650 mg via ORAL

## 2019-02-03 MED ORDER — MIDAZOLAM HCL 2 MG/2ML IJ SOLN
INTRAMUSCULAR | Status: AC
  Filled 2019-02-03: qty 2

## 2019-02-03 MED ORDER — HEPARIN (PORCINE) IN NACL 1000-0.9 UT/500ML-% IV SOLN
INTRAVENOUS | Status: AC
  Filled 2019-02-03: qty 1000

## 2019-02-03 SURGICAL SUPPLY — 15 items
CARDIOMEMS PA SENSOR W/DELIVER (Prosthesis & Implant Heart) ×2 IMPLANT
CATH SWAN GANZ 7F STRAIGHT (CATHETERS) ×2 IMPLANT
KIT HEART LEFT (KITS) ×2 IMPLANT
PACK CARDIAC CATHETERIZATION (CUSTOM PROCEDURE TRAY) ×2 IMPLANT
SENSOR CARDIOMEMS PA W/DELIVER (Prosthesis & Implant Heart) ×1 IMPLANT
SHEATH AVANTI 11F 11CM (SHEATH) ×2 IMPLANT
SHEATH FAST CATH 12F 12CM (SHEATH) ×2 IMPLANT
SHEATH PINNACLE 6F 10CM (SHEATH) ×2 IMPLANT
SHEATH PINNACLE 8F 10CM (SHEATH) ×2 IMPLANT
SHEATH PROBE COVER 6X72 (BAG) ×2 IMPLANT
TRANSDUCER W/STOPCOCK (MISCELLANEOUS) ×2 IMPLANT
TUBING CIL FLEX 10 FLL-RA (TUBING) ×2 IMPLANT
WIRE EMERALD 3MM-J .025X260CM (WIRE) ×2 IMPLANT
WIRE EMERALD 3MM-J .035X150CM (WIRE) ×2 IMPLANT
WIRE G V18X300CM (WIRE) ×2 IMPLANT

## 2019-02-03 NOTE — Progress Notes (Signed)
No bleeding or swelling noted to right groin after ambulation

## 2019-02-03 NOTE — Telephone Encounter (Signed)
Pt recently took Doxycyline 100mg  BID for ulcers on legs, Cards Rx'd d/t heart condition which causes leg swelling/poor circulation.   Pt states that she cannot do the vaginal creams for yeast infections - they burn terribly.  Pt states that the [place] worked great last time but it did mess with her INR levels and she needed to have her INR checked x 5 days after taking.   Can call daughter [phone] if need to reach her today for any Rxs called in.

## 2019-02-03 NOTE — Telephone Encounter (Signed)
Please call patient's daughter and let her know that I have sent in an order for diflucan. I have discussed her warfarin management with Mandy. The patient is to take 1 tablet of warfarin 2 mg nightly (instead of 2 post procedure that was originally planned) and follow up with INR check on Monday. Please let us know if she needs to stay in her vehicle to have INR done. If possible, have her come between 11-12.

## 2019-02-03 NOTE — Telephone Encounter (Signed)
See other phone encounter from today regarding details.

## 2019-02-03 NOTE — Telephone Encounter (Signed)
I spoke with patient's granddaughter and reviewed all instructions.  Patient will come via [facility] for INR check on Monday 10/5 at 1230.  Grandaughter is very concerned that patient received Fentanyl today at the hospital for pain due to high incident of severe allergic reaction to narcotics and need for Narcan in past after taking morphine and codeine.  She wants to know if her chart should be labeled "no pain medication" or at least have more information flagged for what she can and can't safely take.   She is concerned that she should not have Fentanyl either although she doesn't think 1 dose will hurt her.  She just doesn't want her to be placed on something or given something consecutively like this that she feels could kill her.   Granddaughter would like Debbie's opinion on whether her allergy list should be updated to show more allergies given patient history.

## 2019-02-03 NOTE — Progress Notes (Signed)
Site area: right groin  Site Prior to Removal:  Level 0   Pressure Applied For 12 MINUTES    Minutes Beginning at 1445  Manual:   Yes.    Patient Status During Pull: stable   Post Pull Groin Site:  Level 0  Post Pull Instructions Given:  Yes.    Post Pull Pulses Present:  Yes.    Dressing Applied:  Yes.    Comments:  4 hr bed rest

## 2019-02-03 NOTE — Interval H&P Note (Signed)
History and Physical Interval Note:  02/03/2019 1:08 PM  [name]  has presented today for surgery, with the diagnosis of Heart failure.  The various methods of treatment have been discussed with the patient and family. After consideration of risks, benefits and other options for treatment, the patient has consented to  Procedure(s): PRESSURE SENSOR/CARDIOMEMS (N/A) as a surgical intervention.  The patient's history has been reviewed, patient examined, no change in status, stable for surgery.  I have reviewed the patient's chart and labs.  Questions were answered to the patient's satisfaction.     Renda Pohlman [employer]

## 2019-02-03 NOTE — Progress Notes (Signed)
ANTICOAGULATION CONSULT NOTE - Initial Consult  Pharmacy Consult for warfarin Indication: atrial fibrillation  Allergies  Allergen Reactions  . Dilaudid [Hydromorphone] Other (See Comments)    Excessive Somnolence  Needing Narcan.  . Codeine Nausea And Vomiting    Hallucinations   . Morphine And Related Nausea And Vomiting    Hallucinations   . Sulfonamide Derivatives Other (See Comments)    Dry mouth    Patient Measurements: Height: 5\' 6"  (167.6 cm) Weight: 165 lb (74.8 kg) IBW/kg (Calculated) : 59.3  Vital Signs: Temp: 98.3 F (36.8 C) (09/30 1148) Temp Source: Oral (09/30 1148) BP: 150/38 (09/30 1419) Pulse Rate: 60 (09/30 1419)  Labs: Recent Labs    02/03/19 1144  LABPROT 15.4*  INR 1.2    Estimated Creatinine Clearance: 31.6 mL/min (A) (by C-G formula based on SCr of 1.42 mg/dL (H)).  Assessment: CC/HPI: 85 yo f presenting for cardiomems placement  PMH: permanent afib, dCHF, CKD3, COPD, renal cell CA s/p nephrectomy  Anticoag: warfarin pta for afib. Admit INR 1.2 - last dose 9/25  PTA warfarin 3 mg W, 2 mg AOD  Goal of Therapy:  INR 2-3 Monitor platelets by anticoagulation protocol: Yes   Plan:  Warfarin 3 mg x 1 Daily INR  Levester Fresh, PharmD, BCPS, BCCCP Clinical Pharmacist [phone]  Please check AMION for all [place] numbers  02/03/2019 3:20 PM

## 2019-02-03 NOTE — Progress Notes (Signed)
Diflucan 150 mg po x 1 sent to patient's pharmacy. Will instruct her to follow up INR on 02/08/19. She was to reload warfarin after procedure today. Will call her daughter and instruct her to take one 2mg  dose daily until Monday.

## 2019-02-03 NOTE — Discharge Instructions (Signed)
Take usual dose of coumadin tonight Venogram, Care After cardiomem insert This sheet gives you information about how to care for yourself after your procedure. Your health care provider may also give you more specific instructions. If you have problems or questions, contact your health care provider. What can I expect after the procedure? After the procedure, it is common to have bruising and tenderness at the catheter insertion area. Follow these instructions at home: Insertion site care  Follow instructions from your health care provider about how to take care of your insertion site. Make sure you: ? Wash your hands with soap and water before you change your bandage (dressing). If soap and water are not available, use hand sanitizer. ? Change your dressing as told by your health care provider. ? Leave stitches (sutures), skin glue, or adhesive strips in place. These skin closures may need to stay in place for 2 weeks or longer. If adhesive strip edges start to loosen and curl up, you may trim the loose edges. Do not remove adhesive strips completely unless your health care provider tells you to do that.  Do not take baths, swim, or use a hot tub until your health care provider approves.  You may shower 24-48 hours after the procedure or as told by your health care provider. ? Gently wash the site with plain soap and water. ? Pat the area dry with a clean towel. ? Do not rub the site. This may cause bleeding.  Do not apply powder or lotion to the site. Keep the site clean and dry.  Check your insertion site every day for signs of infection. Check for: ? Redness, swelling, or pain. ? Fluid or blood. ? Warmth. ? Pus or a bad smell. Activity  Rest as told by your health care provider, usually for 1-2 days.  Do not lift anything that is heavier than 10 lbs. (4.5 kg) or as told by your health care provider.  Do not drive for 24 hours if you were given a medicine to help you relax  (sedative).  Do not drive or use heavy machinery while taking prescription pain medicine. General instructions   Return to your normal activities as told by your health care provider, usually in about a week. Ask your health care provider what activities are safe for you.  If the catheter site starts bleeding, lie flat and put pressure on the site. If the bleeding does not stop, get help right away. This is a medical emergency.  Drink enough fluid to keep your urine clear or pale yellow. This helps flush the contrast dye from your body.  Take over-the-counter and prescription medicines only as told by your health care provider.  Keep all follow-up visits as told by your health care provider. This is important. Contact a health care provider if:  You have a fever or chills.  You have redness, swelling, or pain around your insertion site.  You have fluid or blood coming from your insertion site.  The insertion site feels warm to the touch.  You have pus or a bad smell coming from your insertion site.  You have bruising around the insertion site.  You notice blood collecting in the tissue around the catheter site (hematoma). The hematoma may be painful to the touch. Get help right away if:  You have severe pain at the catheter insertion area.  The catheter insertion area swells very fast.  The catheter insertion area is bleeding, and the bleeding does not stop  when you hold steady pressure on the area.  The area near or just beyond the catheter insertion site becomes pale, cool, tingly, or numb. These symptoms may represent a serious problem that is an emergency. Do not wait to see if the symptoms will go away. Get medical help right away. Call your local emergency services (911 in the [place] Do not drive yourself to the hospital. Summary  After the procedure, it is common to have bruising and tenderness at the catheter insertion area.  After the procedure, it is important to  rest and drink plenty of fluids.  Do not take baths, swim, or use a hot tub until your health care provider says it is okay to do so. You may shower 24-48 hours after the procedure or as told by your health care provider.  If the catheter site starts bleeding, lie flat and put pressure on the site. If the bleeding does not stop, get help right away. This is a medical emergency. This information is not intended to replace advice given to you by your health care provider. Make sure you discuss any questions you have with your health care provider. Document Released: 11/08/2004 Document Revised: 04/04/2017 Document Reviewed: 03/27/2016 Elsevier Patient Education  2020 [employer].

## 2019-02-03 NOTE — Progress Notes (Signed)
Dr [name] was contacted/ pt can take own warfarin at home tonight.

## 2019-02-04 ENCOUNTER — Encounter (HOSPITAL_COMMUNITY): Payer: Self-pay | Admitting: Cardiology

## 2019-02-04 LAB — URINE CULTURE
MICRO NUMBER:: 934246
SPECIMEN QUALITY:: ADEQUATE

## 2019-02-05 ENCOUNTER — Other Ambulatory Visit: Payer: Self-pay | Admitting: Internal Medicine

## 2019-02-05 ENCOUNTER — Encounter: Payer: Self-pay | Admitting: Family Medicine

## 2019-02-05 NOTE — Telephone Encounter (Signed)
Called and spoke with patient's granddaughter, [name].  Updated patient's allergies to reflect previous need for Narcan when she had fentanyl that was administered around the same time as Dilaudid.

## 2019-02-08 ENCOUNTER — Other Ambulatory Visit: Payer: Self-pay

## 2019-02-08 ENCOUNTER — Encounter (HOSPITAL_COMMUNITY): Payer: PPO

## 2019-02-08 ENCOUNTER — Ambulatory Visit (INDEPENDENT_AMBULATORY_CARE_PROVIDER_SITE_OTHER): Payer: PPO

## 2019-02-08 DIAGNOSIS — Z7901 Long term (current) use of anticoagulants: Secondary | ICD-10-CM | POA: Diagnosis not present

## 2019-02-08 DIAGNOSIS — Z01810 Encounter for preprocedural cardiovascular examination: Secondary | ICD-10-CM

## 2019-02-08 LAB — POCT INR: INR: 1.7 — AB (ref 2.0–3.0)

## 2019-02-08 NOTE — Patient Instructions (Signed)
INR today 1.7  Take 3mg  today for a boost (10/5) and then Continue prior dose of 2 mg daily except 3 mg on Wednesdays.  Recheck on 10/8 following procedure and hold last week.  Patient also has had recent medication changes with 1 day of diflucan last week and new start on aspirin and lasix increase in past week.

## 2019-02-09 ENCOUNTER — Telehealth: Payer: Self-pay

## 2019-02-09 ENCOUNTER — Encounter (HOSPITAL_COMMUNITY): Payer: Self-pay | Admitting: Cardiology

## 2019-02-09 ENCOUNTER — Ambulatory Visit (INDEPENDENT_AMBULATORY_CARE_PROVIDER_SITE_OTHER): Payer: PPO

## 2019-02-09 DIAGNOSIS — Z95810 Presence of automatic (implantable) cardiac defibrillator: Secondary | ICD-10-CM

## 2019-02-09 DIAGNOSIS — I5032 Chronic diastolic (congestive) heart failure: Secondary | ICD-10-CM

## 2019-02-09 NOTE — Telephone Encounter (Signed)
Left message for patient to remind of missed remote transmission.  

## 2019-02-10 NOTE — Progress Notes (Unsigned)
Patient no longer enrolled in ICM clinic due to she is being monitored by [facility] after Cardiomems implant.  Device clinic will continue to monitor remote transmissions every 3 months per protocol.

## 2019-02-11 ENCOUNTER — Other Ambulatory Visit: Payer: Self-pay

## 2019-02-11 ENCOUNTER — Ambulatory Visit (INDEPENDENT_AMBULATORY_CARE_PROVIDER_SITE_OTHER): Payer: PPO | Admitting: General Practice

## 2019-02-11 DIAGNOSIS — Z7901 Long term (current) use of anticoagulants: Secondary | ICD-10-CM

## 2019-02-11 DIAGNOSIS — Z01810 Encounter for preprocedural cardiovascular examination: Secondary | ICD-10-CM | POA: Diagnosis not present

## 2019-02-11 LAB — POCT INR: INR: 2.4 (ref 2.0–3.0)

## 2019-02-11 NOTE — Patient Instructions (Signed)
Pre visit review using our clinic review tool, if applicable. No additional management support is needed unless otherwise documented below in the visit note.  Continue prior dose of 2 mg daily except 3 mg on Wednesdays. Recheck in 3 to 4 weeks.

## 2019-02-12 NOTE — Progress Notes (Signed)
EPIC Encounter for ICM Monitoring  Patient Name: [name] is a 85 y.o. female Date: 02/12/2019 Primary Care Physican: Elby Beck, [place] Primary Cardiologist: [facility] [address] Electrophysiologist: Caryl Comes  Patient no longer enrolled in [facility] clinic due to she is being monitored by [facility] after Cardiomems implant.  Device clinic will continue to monitor remote transmissions every 3 months per protocol.   Optivol thoracic impedancenormal.  Prescribed:Torsemide 20 mgTake 5 tablet (100 mg) by mouth twice daily.  Recommendations: None  Follow-up plan: No further ICM follow up.   91 day device clinic remote transmission 04/05/2019.  Copy of ICM check sent to Dr. Caryl Comes.   3 month ICM trend: 02/12/2019    1 Year ICM trend:       Rosalene Billings, RN 02/12/2019 1:26 PM

## 2019-02-15 ENCOUNTER — Inpatient Hospital Stay: Payer: PPO | Attending: Hematology | Admitting: Hematology

## 2019-02-15 NOTE — Progress Notes (Signed)
I have reviewed this visit and I agree on the patient's plan of dosage and recommendations. Deborah B Gessner, FNP   

## 2019-02-22 ENCOUNTER — Ambulatory Visit: Payer: PPO | Admitting: Family Medicine

## 2019-02-23 ENCOUNTER — Ambulatory Visit (HOSPITAL_BASED_OUTPATIENT_CLINIC_OR_DEPARTMENT_OTHER)
Admission: RE | Admit: 2019-02-23 | Discharge: 2019-02-23 | Disposition: A | Discharge status: Home / Self Care | Payer: PPO | Source: Ambulatory Visit | Attending: Adult Health | Admitting: Adult Health

## 2019-02-23 ENCOUNTER — Other Ambulatory Visit: Payer: Self-pay

## 2019-02-23 VITALS — Wt 168.0 lb

## 2019-02-23 DIAGNOSIS — I2510 Atherosclerotic heart disease of native coronary artery without angina pectoris: Secondary | ICD-10-CM | POA: Insufficient documentation

## 2019-02-23 DIAGNOSIS — D649 Anemia, unspecified: Secondary | ICD-10-CM | POA: Diagnosis not present

## 2019-02-23 DIAGNOSIS — Z95810 Presence of automatic (implantable) cardiac defibrillator: Secondary | ICD-10-CM | POA: Insufficient documentation

## 2019-02-23 DIAGNOSIS — S0990XA Unspecified injury of head, initial encounter: Secondary | ICD-10-CM | POA: Diagnosis not present

## 2019-02-23 DIAGNOSIS — Z87891 Personal history of nicotine dependence: Secondary | ICD-10-CM | POA: Insufficient documentation

## 2019-02-23 DIAGNOSIS — I5033 Acute on chronic diastolic (congestive) heart failure: Secondary | ICD-10-CM

## 2019-02-23 DIAGNOSIS — I5032 Chronic diastolic (congestive) heart failure: Secondary | ICD-10-CM | POA: Insufficient documentation

## 2019-02-23 DIAGNOSIS — Z7901 Long term (current) use of anticoagulants: Secondary | ICD-10-CM | POA: Insufficient documentation

## 2019-02-23 DIAGNOSIS — N183 Chronic kidney disease, stage 3 unspecified: Secondary | ICD-10-CM | POA: Insufficient documentation

## 2019-02-23 DIAGNOSIS — Z882 Allergy status to sulfonamides status: Secondary | ICD-10-CM | POA: Insufficient documentation

## 2019-02-23 DIAGNOSIS — D509 Iron deficiency anemia, unspecified: Secondary | ICD-10-CM | POA: Insufficient documentation

## 2019-02-23 DIAGNOSIS — E7800 Pure hypercholesterolemia, unspecified: Secondary | ICD-10-CM | POA: Insufficient documentation

## 2019-02-23 DIAGNOSIS — I272 Pulmonary hypertension, unspecified: Secondary | ICD-10-CM | POA: Insufficient documentation

## 2019-02-23 DIAGNOSIS — I4891 Unspecified atrial fibrillation: Secondary | ICD-10-CM | POA: Diagnosis not present

## 2019-02-23 DIAGNOSIS — R600 Localized edema: Secondary | ICD-10-CM | POA: Diagnosis not present

## 2019-02-23 DIAGNOSIS — R270 Ataxia, unspecified: Secondary | ICD-10-CM | POA: Diagnosis not present

## 2019-02-23 DIAGNOSIS — R0602 Shortness of breath: Secondary | ICD-10-CM | POA: Diagnosis not present

## 2019-02-23 DIAGNOSIS — Z85528 Personal history of other malignant neoplasm of kidney: Secondary | ICD-10-CM | POA: Insufficient documentation

## 2019-02-23 DIAGNOSIS — I130 Hypertensive heart and chronic kidney disease with heart failure and stage 1 through stage 4 chronic kidney disease, or unspecified chronic kidney disease: Secondary | ICD-10-CM | POA: Diagnosis not present

## 2019-02-23 DIAGNOSIS — Z20828 Contact with and (suspected) exposure to other viral communicable diseases: Secondary | ICD-10-CM | POA: Diagnosis not present

## 2019-02-23 DIAGNOSIS — Z885 Allergy status to narcotic agent status: Secondary | ICD-10-CM | POA: Insufficient documentation

## 2019-02-23 DIAGNOSIS — I428 Other cardiomyopathies: Secondary | ICD-10-CM

## 2019-02-23 DIAGNOSIS — I4821 Permanent atrial fibrillation: Secondary | ICD-10-CM | POA: Insufficient documentation

## 2019-02-23 DIAGNOSIS — Z8249 Family history of ischemic heart disease and other diseases of the circulatory system: Secondary | ICD-10-CM | POA: Insufficient documentation

## 2019-02-23 DIAGNOSIS — Z905 Acquired absence of kidney: Secondary | ICD-10-CM | POA: Insufficient documentation

## 2019-02-23 DIAGNOSIS — J449 Chronic obstructive pulmonary disease, unspecified: Secondary | ICD-10-CM | POA: Insufficient documentation

## 2019-02-23 MED ORDER — METOLAZONE 5 MG PO TABS: 5.0000 mg | ORAL_TABLET | ORAL | 3 refills | Status: DC

## 2019-02-23 NOTE — Addendum Note (Signed)
Encounter addended by: Marlise Eves, RN on: 02/23/2019 4:05 PM  Actions taken: Clinical Note Signed, Order list changed

## 2019-02-23 NOTE — Progress Notes (Signed)
Called pt and reviewed medication changes. Reviewed need for lab work for Monday 03/01/19. Faxed over order to [employer] at [facility]. Pt to call and make appointment at her convenience. Follow up appointments made. No further questions at this time.

## 2019-02-23 NOTE — Progress Notes (Signed)
Heart Failure TeleHealth Note  Due to national recommendations of social distancing due to [place] 19, Audio/video telehealth visit is felt to be most appropriate for this patient at this time.  See MyChart message from today for patient consent regarding telehealth for [facility].   02/23/2019 [name]   01-06-1934  [id number]  Primary Physician Elby Beck, FNP Primary Cardiologist: Virl Axe, MD  Electrophysiologist: None   HPI: 85 y/o female, followed by Dr. [name]. Patient has history of permanent atrial fibrillation, chronic diastolic CHF, CKD stage 3, smoking/prior COPD, renal cell CA s/p right nephrectomy. She has a prior history of HFrEF with nonischemic cardiomyoapthy and had a Medtronic ICD placed.  However, subsequently her EF has recovered.  More recently, she has had symptomatic HFpEF.    She has had dyspnea and edema for "a long time," but it has been worse since January of this year. In 6/20, she had left hip ORIF after mechanical fall. Echo in 6/20 showed EF 60-65%, mild RV dilation with normal systolic function, PASP 93 mmHg.After she got home from the hospital stay post-ORIF, she noted marked peripheral edema. She was short of breath walking short distances. +orthopnea. She came back to the hospital on 11/04/18 and was admitted with volume overload.  She was diuresed aggressively and lost about 22 lbs.  RHC showed pulmonary venous hypertension.  PCWP tracing had prominent v-waves in absence of significant MR, suggesting significant diastolic dysfunction.   She has been noted to have significant Fe deficiency anemia, but FOBT was negative during 7/20 hospitalization.   S/P Cardiomems 02/03/2019.   Today she presents for telehealth visit. Overall feeling fair. Complaining of increased leg edema.  SOB with exertion. + Orthopnea. Denies PND.  Increased lower extremity edema. She reports increased lower extremity edema with open wounds and redness.   Appetite ok. No fever or chills. Weight at home trending up 165-166  pounds. Taking all medications.   Cardiomems Reading  02/23/19--> 24   No outpatient medications have been marked as taking for the 02/23/19 encounter (Appointment) with [name].   Allergies  Allergen Reactions  . Dilaudid [Hydromorphone] Other (See Comments)    Excessive Somnolence  Needing Narcan.  . Fentanyl Other (See Comments)    Required narcan when given with dilaudid. Tolerated single dose when given during procedure 02/03/2019.   Marland Kitchen Codeine Nausea And Vomiting    Hallucinations   . Morphine And Related Nausea And Vomiting    Hallucinations   . Sulfonamide Derivatives Other (See Comments)    Dry mouth   Past Medical History:  Diagnosis Date  . Adjustment disorder with anxiety   . Anginal pain ([place])   . Arthritis    "some in my hands" (11/11/2012)  . Atrial fibrillation ([place])   . Automatic implantable cardioverter-defibrillator in situ   . Blood in stool   . Carotid artery stenosis 09/2007   a. 09/2007: 60-79% bilateral (stable); b. 10/2008: 40-59% R 60-79%   . Chest pain, unspecified   . Chronic airway obstruction, not elsewhere classified   . Chronic bronchitis ([place])    "get it some; not q year" (11/11/2012)  . Chronic diastolic CHF (congestive heart failure) ([place]) 06/04/2013  . Chronic kidney disease, unspecified   . Coronary artery disease    non-obstructive by 2006 cath  . Frequent UTI    "get them a couple times/yr" (11/11/2012)  . Hemorrhage of rectum and anus   . High cholesterol   . History of  blood transfusion 04/2011   "after hip OR" (11/11/2012)  . Hypertension 05/20/2011  . Hypertr obst cardiomyop   . Hypotension, unspecified    cardiac cath 2006..nonobstructive CAD 30-40s lesions.Marland KitchenETT 1/09 nondiagnostic due to poor HR response..Right Renal Cancer 2003  . Long term (current) use of anticoagulants   . Malignant neoplasm of kidney, except pelvis   . Osteoarthritis of right hip   . Other  and unspecified coagulation defects   . PONV (postoperative nausea and vomiting)   . Renal cancer ([place]) 06/2001   Right  . Secondary cardiomyopathy, unspecified   . Sinus bradycardia   . Urge incontinence    Family History  Problem Relation Age of Onset  . Heart failure Mother   . Breast cancer Maternal Aunt 82  . Breast cancer Cousin   . Breast cancer Other   . Colon cancer Neg Hx    Past Surgical History:  Procedure Laterality Date  . ABDOMINAL HYSTERECTOMY  1975   for benign causes  . APPENDECTOMY    . BI-VENTRICULAR PACEMAKER UPGRADE  05/04/2010  . CARDIAC CATHETERIZATION  2006  . CARDIOVERSION N/A 02/20/2018   Procedure: CARDIOVERSION;  Surgeon: Minna Merritts, MD;  Location: [facility];  Service: Cardiovascular;  Laterality: N/A;  . CARDIOVERSION N/A 03/27/2018   Procedure: CARDIOVERSION;  Surgeon: Minna Merritts, MD;  Location: [facility];  Service: Cardiovascular;  Laterality: N/A;  . CATARACT EXTRACTION W/ INTRAOCULAR LENS  IMPLANT, BILATERAL  01/2006-02-2006  . CHOLECYSTECTOMY N/A 11/11/2012   Procedure: LAPAROSCOPIC CHOLECYSTECTOMY WITH INTRAOPERATIVE CHOLANGIOGRAM;  Surgeon: Imogene Burn. Georgette Dover, MD;  Location: [facility];  Service: General;  Laterality: N/A;  . EP IMPLANTABLE DEVICE N/A 02/21/2016   Procedure: ICD Generator Changeout;  Surgeon: Deboraha Sprang, MD;  Location: [facility];  Service: Cardiovascular;  Laterality: N/A;  . INSERT / REPLACE / REMOVE PACEMAKER  05-01-11   02-28-05-/05-04-10-ICD-MEDTRONIC MAXIMAL DR  . JOINT REPLACEMENT    . LAPAROSCOPIC CHOLECYSTECTOMY  11/11/2012  . LAPAROSCOPIC LYSIS OF ADHESIONS N/A 11/11/2012   Procedure: LAPAROSCOPIC LYSIS OF ADHESIONS;  Surgeon: Imogene Burn. Georgette Dover, MD;  Location: [facility];  Service: General;  Laterality: N/A;  . NEPHRECTOMY Right 06/2001    S/P RENAL CELL CANCER  . PRESSURE SENSOR/CARDIOMEMS N/A 02/03/2019   Procedure: PRESSURE SENSOR/CARDIOMEMS;  Surgeon: Larey Dresser, MD;  Location: [facility];   Service: Cardiovascular;  Laterality: N/A;  . RIGHT HEART CATH N/A 11/09/2018   Procedure: RIGHT HEART CATH;  Surgeon: Larey Dresser, MD;  Location: [facility];  Service: Cardiovascular;  Laterality: N/A;  . TOTAL HIP ARTHROPLASTY Right 05/03/2011   Procedure: TOTAL HIP ARTHROPLASTY ANTERIOR APPROACH;  Surgeon: Mcarthur Rossetti;  Location: [facility];  Service: Orthopedics;  Laterality: Right;  Removal of Cannulated Screws Right Hip, Right Direct Anterior Hip Replacement  . TOTAL HIP ARTHROPLASTY Left 10/09/2018   Procedure: TOTAL HIP ARTHROPLASTY ANTERIOR APPROACH;  Surgeon: Rod Can, MD;  Location: [facility];  Service: Orthopedics;  Laterality: Left;   Social History   Socioeconomic History  . Marital status: Widowed    Spouse name: Not on file  . Number of children: Not on file  . Years of education: Not on file  . Highest education level: Not on file  Occupational History  . Occupation: Retired    Fish farm manager: RETIRED  Social Needs  . Financial resource strain: Not on file  . Food insecurity    Worry: Not on file    Inability: Not on file  .  Transportation needs    Medical: Not on file    Non-medical: Not on file  Tobacco Use  . Smoking status: Former Smoker    Packs/day: 0.50    Years: 40.00    Pack years: 20.00    Types: Cigarettes    Quit date: 05/06/2001    Years since quitting: 17.8  . Smokeless tobacco: Never Used  Substance and Sexual Activity  . Alcohol use: No    Alcohol/week: 0.0 standard drinks  . Drug use: No  . Sexual activity: Never  Lifestyle  . Physical activity    Days per week: Not on file    Minutes per session: Not on file  . Stress: Not on file  Relationships  . Social Herbalist on phone: Not on file    Gets together: Not on file    Attends religious service: Not on file    Active member of club or organization: Not on file    Attends meetings of clubs or organizations: Not on file    Relationship status: Not on file  .  Intimate partner violence    Fear of current or ex partner: Not on file    Emotionally abused: Not on file    Physically abused: Not on file    Forced sexual activity: Not on file  Other Topics Concern  . 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   ALCOHOL USE-NO   RETIRED   FORMER TOBACCO USE.....1/2 PACK X 40 YRS UNTIL 2003   Would desire CPR     Lipid Panel     Component Value Date/Time   CHOL 179 03/19/2016 1123   CHOL 271 (H) 06/09/2014 1317   TRIG 110.0 03/19/2016 1123   TRIG 241 (H) 06/09/2014 1317   HDL 39.10 03/19/2016 1123   HDL 40 06/09/2014 1317   CHOLHDL 5 03/19/2016 1123   VLDL 22.0 03/19/2016 1123   VLDL 48 (H) 06/09/2014 1317   LDLCALC 118 (H) 03/19/2016 1123   LDLCALC 183 (H) 06/09/2014 1317   LDLDIRECT 146.0 11/07/2016 1128    Physical Exam:  Wt Readings from Last 3 Encounters:  02/23/19 76.2 kg (168 lb)  02/03/19 74.8 kg (165 lb)  01/20/19 73.4 kg (161 lb 12.8 oz)   Virtual with Doximity  General: . No resp difficulty HEENT: normal Neck: supple. no JVD. Carotids 2+ bilat; no bruits. No lymphadenopathy or thryomegaly appreciated. Cor: PMI nondisplaced. Regular rate & rhythm. No rubs, gallops or murmurs. Lungs: clear Abdomen: soft, nontender, nondistended. No hepatosplenomegaly. No bruits or masses. Good bowel sounds. Extremities: no cyanosis, clubbing, rash, R and LLE 2+ edema with open wounds.  Neuro: alert & orientedx3, cranial nerves grossly intact. moves all 4 extremities w/o difficulty. Affect pleasant   ASSESSMENT AND PLAN:  1. Chronic diastolic CHF: Echo in 5/85 showed EF 60-65%, mild RV dilation with normal systolic function, PASP 93 mmHg. She had remote nonischemic cardiomyopathy with recovery of EF, has Medtronic ICD. Suspect that there is significant RV dysfunction. Atrial  fibrillation likely contributes, but this appears to be permanent now as she has failed multiple anti-arrhythmics and is reasonably rate-controlled. RHC7/6/20showed mild-moderately elevated PCWP and RA pressure, primarily pulmonary venous hypertension. Prominent v-waves on PCWP tracing may be due to stiff ventricle/diastolic dysfunction as she had only mild  MR on recent echo.  - NYHA IIIb.  - Cardiomems Reading 24 mm Hg today. Sounds volume overloaded. Will need to lower Cardiomems Goal to 20 mm hg.   - Volume status elevated. Continue torsemide  100 mg twice a day.  - Increase metolazone 5 mg every Thursday and she will take an extra 5 mg today + 40 meq of potassium.  to twice a day.  - Continue  spironolactone to 25 mg daily.  - Discussed low salt food choices and to limit fluid intake to < 2 liters per day.   2. Pulmonary hypertension: PA systolic pressure estimated 93 mmHg on echo. Based on RHC7/6/20, she has primarily pulmonary venous hypertension.   3. CKD: Stage 3. - Check BMET at PCP on Monday  4. Anemia: Fe deficiency. 7/20 admission, she got IV Fe.  - check CBC at PCP on Monday.   5. Atrial fibrillation: Permanent now.  .  She has failed amiodarone, Tikosyn, and flecainide as well as multiple cardioversions.  -Continue warfarin.  - h/o falls, 2 in the last 6 months. If recurrent falls, will need to re weigh risk vs benefit - advised to seek medical attention if fall w/ head injury    Check BMET/CBC on Monday at PCP.   Follow up in 2 weeks.   COVID screen The patient does not have any symptoms that suggest any further testing/ screening at this time.  Social distancing reinforced today.  Patient Risk: After full review of this patients clinical status, I feel that they are at moderate risk for cardiac decompensation at this time.  Relevant cardiac medications were reviewed at length with the patient today. The patient does not have concerns regarding their  medications at this time.   The following changes were made today:  See above.  Recommended follow-up:  2 weeks.   Today, I have spent 15 minutes with the patient with telehealth technology discussing the above issues .    Jeanmarie Hubert, NP  02/23/2019 3:24 PM  [place] [address] [facility] [place] [address] [phone] (office) [phone] (fax)  Darrick Grinder NP-C  1:30 PM

## 2019-02-23 NOTE — Patient Instructions (Signed)
Lab work to be drawn at your PCP on Monday. We have faxed over your order form.  Please take 5mg  of metolazone today.  Please increase your Metolazone to 5mg  every Thursday.  Please follow up with the [facility] in 2 weeks and again in 8 weeks.   At the [facility], you and your health needs are our priority. As part of our continuing mission to provide you with exceptional heart care, we have created designated Provider Care Teams. These Care Teams include your primary Cardiologist (physician) and Advanced Practice Providers (APPs- Physician Assistants and Nurse Practitioners) who all work together to provide you with the care you need, when you need it.   You may see any of the following providers on your designated Care Team at your next follow up: Marland Kitchen Dr Glori Bickers . Dr Loralie Champagne . Darrick Grinder, NP . Lyda Jester, PA   Please be sure to bring in all your medications bottles to every appointment.

## 2019-02-24 ENCOUNTER — Telehealth: Payer: Self-pay | Admitting: Family Medicine

## 2019-02-24 ENCOUNTER — Emergency Department (HOSPITAL_COMMUNITY): Payer: PPO

## 2019-02-24 ENCOUNTER — Other Ambulatory Visit: Payer: Self-pay

## 2019-02-24 ENCOUNTER — Encounter (HOSPITAL_COMMUNITY): Payer: Self-pay | Admitting: Emergency Medicine

## 2019-02-24 ENCOUNTER — Inpatient Hospital Stay (HOSPITAL_COMMUNITY)
Admission: EM | Admit: 2019-02-24 | Discharge: 2019-03-17 | DRG: 286 | Disposition: A | Discharge status: Home / Self Care | Payer: PPO | Attending: Internal Medicine | Admitting: Internal Medicine

## 2019-02-24 DIAGNOSIS — Z20828 Contact with and (suspected) exposure to other viral communicable diseases: Secondary | ICD-10-CM | POA: Diagnosis not present

## 2019-02-24 DIAGNOSIS — I428 Other cardiomyopathies: Secondary | ICD-10-CM | POA: Diagnosis present

## 2019-02-24 DIAGNOSIS — I5043 Acute on chronic combined systolic (congestive) and diastolic (congestive) heart failure: Secondary | ICD-10-CM | POA: Diagnosis present

## 2019-02-24 DIAGNOSIS — E782 Mixed hyperlipidemia: Secondary | ICD-10-CM | POA: Diagnosis present

## 2019-02-24 DIAGNOSIS — I5033 Acute on chronic diastolic (congestive) heart failure: Secondary | ICD-10-CM | POA: Diagnosis present

## 2019-02-24 DIAGNOSIS — R011 Cardiac murmur, unspecified: Secondary | ICD-10-CM | POA: Diagnosis present

## 2019-02-24 DIAGNOSIS — K921 Melena: Secondary | ICD-10-CM

## 2019-02-24 DIAGNOSIS — K551 Chronic vascular disorders of intestine: Secondary | ICD-10-CM | POA: Diagnosis present

## 2019-02-24 DIAGNOSIS — I4821 Permanent atrial fibrillation: Secondary | ICD-10-CM | POA: Diagnosis present

## 2019-02-24 DIAGNOSIS — N179 Acute kidney failure, unspecified: Secondary | ICD-10-CM

## 2019-02-24 DIAGNOSIS — I87319 Chronic venous hypertension (idiopathic) with ulcer of unspecified lower extremity: Secondary | ICD-10-CM

## 2019-02-24 DIAGNOSIS — Z79899 Other long term (current) drug therapy: Secondary | ICD-10-CM

## 2019-02-24 DIAGNOSIS — I10 Essential (primary) hypertension: Secondary | ICD-10-CM | POA: Diagnosis present

## 2019-02-24 DIAGNOSIS — Z96643 Presence of artificial hip joint, bilateral: Secondary | ICD-10-CM | POA: Diagnosis present

## 2019-02-24 DIAGNOSIS — Z803 Family history of malignant neoplasm of breast: Secondary | ICD-10-CM

## 2019-02-24 DIAGNOSIS — K5732 Diverticulitis of large intestine without perforation or abscess without bleeding: Secondary | ICD-10-CM | POA: Diagnosis present

## 2019-02-24 DIAGNOSIS — L03115 Cellulitis of right lower limb: Secondary | ICD-10-CM | POA: Diagnosis present

## 2019-02-24 DIAGNOSIS — I87339 Chronic venous hypertension (idiopathic) with ulcer and inflammation of unspecified lower extremity: Secondary | ICD-10-CM | POA: Diagnosis present

## 2019-02-24 DIAGNOSIS — E871 Hypo-osmolality and hyponatremia: Secondary | ICD-10-CM | POA: Diagnosis present

## 2019-02-24 DIAGNOSIS — Z95810 Presence of automatic (implantable) cardiac defibrillator: Secondary | ICD-10-CM

## 2019-02-24 DIAGNOSIS — S0990XA Unspecified injury of head, initial encounter: Secondary | ICD-10-CM | POA: Diagnosis not present

## 2019-02-24 DIAGNOSIS — Z950 Presence of cardiac pacemaker: Secondary | ICD-10-CM

## 2019-02-24 DIAGNOSIS — K2970 Gastritis, unspecified, without bleeding: Secondary | ICD-10-CM | POA: Diagnosis present

## 2019-02-24 DIAGNOSIS — I2510 Atherosclerotic heart disease of native coronary artery without angina pectoris: Secondary | ICD-10-CM | POA: Diagnosis present

## 2019-02-24 DIAGNOSIS — L03116 Cellulitis of left lower limb: Secondary | ICD-10-CM | POA: Diagnosis present

## 2019-02-24 DIAGNOSIS — R0902 Hypoxemia: Secondary | ICD-10-CM

## 2019-02-24 DIAGNOSIS — R270 Ataxia, unspecified: Secondary | ICD-10-CM | POA: Diagnosis not present

## 2019-02-24 DIAGNOSIS — I739 Peripheral vascular disease, unspecified: Secondary | ICD-10-CM | POA: Diagnosis present

## 2019-02-24 DIAGNOSIS — D649 Anemia, unspecified: Secondary | ICD-10-CM | POA: Diagnosis not present

## 2019-02-24 DIAGNOSIS — Z7901 Long term (current) use of anticoagulants: Secondary | ICD-10-CM

## 2019-02-24 DIAGNOSIS — R600 Localized edema: Secondary | ICD-10-CM | POA: Diagnosis not present

## 2019-02-24 DIAGNOSIS — R609 Edema, unspecified: Secondary | ICD-10-CM

## 2019-02-24 DIAGNOSIS — I130 Hypertensive heart and chronic kidney disease with heart failure and stage 1 through stage 4 chronic kidney disease, or unspecified chronic kidney disease: Principal | ICD-10-CM | POA: Diagnosis present

## 2019-02-24 DIAGNOSIS — I872 Venous insufficiency (chronic) (peripheral): Secondary | ICD-10-CM | POA: Diagnosis present

## 2019-02-24 DIAGNOSIS — E039 Hypothyroidism, unspecified: Secondary | ICD-10-CM | POA: Diagnosis present

## 2019-02-24 DIAGNOSIS — Z885 Allergy status to narcotic agent status: Secondary | ICD-10-CM

## 2019-02-24 DIAGNOSIS — I272 Pulmonary hypertension, unspecified: Secondary | ICD-10-CM | POA: Diagnosis present

## 2019-02-24 DIAGNOSIS — Z7982 Long term (current) use of aspirin: Secondary | ICD-10-CM

## 2019-02-24 DIAGNOSIS — K5730 Diverticulosis of large intestine without perforation or abscess without bleeding: Secondary | ICD-10-CM | POA: Diagnosis present

## 2019-02-24 DIAGNOSIS — Z8249 Family history of ischemic heart disease and other diseases of the circulatory system: Secondary | ICD-10-CM

## 2019-02-24 DIAGNOSIS — N184 Chronic kidney disease, stage 4 (severe): Secondary | ICD-10-CM | POA: Diagnosis present

## 2019-02-24 DIAGNOSIS — R0602 Shortness of breath: Secondary | ICD-10-CM | POA: Diagnosis not present

## 2019-02-24 DIAGNOSIS — I509 Heart failure, unspecified: Secondary | ICD-10-CM

## 2019-02-24 DIAGNOSIS — Z882 Allergy status to sulfonamides status: Secondary | ICD-10-CM

## 2019-02-24 DIAGNOSIS — K5521 Angiodysplasia of colon with hemorrhage: Secondary | ICD-10-CM | POA: Diagnosis present

## 2019-02-24 DIAGNOSIS — Z905 Acquired absence of kidney: Secondary | ICD-10-CM

## 2019-02-24 DIAGNOSIS — L97909 Non-pressure chronic ulcer of unspecified part of unspecified lower leg with unspecified severity: Secondary | ICD-10-CM

## 2019-02-24 DIAGNOSIS — Z79890 Hormone replacement therapy (postmenopausal): Secondary | ICD-10-CM

## 2019-02-24 DIAGNOSIS — Z8673 Personal history of transient ischemic attack (TIA), and cerebral infarction without residual deficits: Secondary | ICD-10-CM

## 2019-02-24 DIAGNOSIS — I4891 Unspecified atrial fibrillation: Secondary | ICD-10-CM | POA: Diagnosis not present

## 2019-02-24 DIAGNOSIS — D62 Acute posthemorrhagic anemia: Secondary | ICD-10-CM

## 2019-02-24 DIAGNOSIS — J449 Chronic obstructive pulmonary disease, unspecified: Secondary | ICD-10-CM | POA: Diagnosis present

## 2019-02-24 DIAGNOSIS — E876 Hypokalemia: Secondary | ICD-10-CM | POA: Diagnosis not present

## 2019-02-24 DIAGNOSIS — Z85528 Personal history of other malignant neoplasm of kidney: Secondary | ICD-10-CM

## 2019-02-24 DIAGNOSIS — Z66 Do not resuscitate: Secondary | ICD-10-CM | POA: Diagnosis present

## 2019-02-24 LAB — CBC
HCT: 21.5 % — ABNORMAL LOW (ref 36.0–46.0)
Hemoglobin: 6.2 g/dL — CL (ref 12.0–15.0)
MCH: 23 pg — ABNORMAL LOW (ref 26.0–34.0)
MCHC: 28.8 g/dL — ABNORMAL LOW (ref 30.0–36.0)
MCV: 79.9 fL — ABNORMAL LOW (ref 80.0–100.0)
Platelets: 309 10*3/uL (ref 150–400)
RBC: 2.69 MIL/uL — ABNORMAL LOW (ref 3.87–5.11)
RDW: 17.2 % — ABNORMAL HIGH (ref 11.5–15.5)
WBC: 6 10*3/uL (ref 4.0–10.5)
nRBC: 0.5 % — ABNORMAL HIGH (ref 0.0–0.2)

## 2019-02-24 LAB — BASIC METABOLIC PANEL
Anion gap: 13 (ref 5–15)
BUN: 63 mg/dL — ABNORMAL HIGH (ref 8–23)
CO2: 26 mmol/L (ref 22–32)
Calcium: 9.3 mg/dL (ref 8.9–10.3)
Chloride: 96 mmol/L — ABNORMAL LOW (ref 98–111)
Creatinine, Ser: 1.62 mg/dL — ABNORMAL HIGH (ref 0.44–1.00)
GFR calc Af Amer: 34 mL/min — ABNORMAL LOW (ref >60)
GFR calc non Af Amer: 29 mL/min — ABNORMAL LOW (ref >60)
Glucose, Bld: 107 mg/dL — ABNORMAL HIGH (ref 70–99)
Potassium: 4.2 mmol/L (ref 3.5–5.1)
Sodium: 135 mmol/L (ref 135–145)

## 2019-02-24 LAB — PROTIME-INR
INR: 2.5 — ABNORMAL HIGH (ref 0.8–1.2)
Prothrombin Time: 26.2 seconds — ABNORMAL HIGH (ref 11.4–15.2)

## 2019-02-24 LAB — BRAIN NATRIURETIC PEPTIDE: B Natriuretic Peptide: 314.7 pg/mL — ABNORMAL HIGH (ref 0.0–100.0)

## 2019-02-24 LAB — TROPONIN I (HIGH SENSITIVITY): Troponin I (High Sensitivity): 74 ng/L — ABNORMAL HIGH (ref <18)

## 2019-02-24 MED ORDER — SODIUM CHLORIDE 0.9% FLUSH: 3.0000 mL | Freq: Once | INTRAVENOUS | Status: DC

## 2019-02-24 MED ORDER — FUROSEMIDE 10 MG/ML IJ SOLN: 80.0000 mg | Freq: Once | INTRAMUSCULAR | Status: DC

## 2019-02-24 MED ORDER — SODIUM CHLORIDE 0.9 % IV SOLN
10.0000 mL/h | Freq: Once | INTRAVENOUS | Status: AC
  Administered 2019-02-26: 10 mL/h via INTRAVENOUS

## 2019-02-24 NOTE — ED Provider Notes (Signed)
Emergency Department Provider Note   I have reviewed the triage vital signs and the nursing notes.   HISTORY  Chief Complaint Shortness of Breath and Fall   HPI [name] is a 85 y.o. female who presents to ED with worsening dyspnea and weakness for last few weeks. Also with worening peripheral edema. No chest pain. No recent illnesses. H/o chronic anemia requiring transfusions.    No other associated or modifying symptoms.    Past Medical History:  Diagnosis Date   Adjustment disorder with anxiety    Anginal pain ([place])    Arthritis    "some in my hands" (11/11/2012)   Atrial fibrillation (HCC)    Automatic implantable cardioverter-defibrillator in situ    Blood in stool    Carotid artery stenosis 09/2007   a. 09/2007: 60-79% bilateral (stable); b. 10/2008: 40-59% R 60-79%    Chest pain, unspecified    Chronic airway obstruction, not elsewhere classified    Chronic bronchitis ([place])    "get it some; not q year" (11/11/2012)   Chronic diastolic CHF (congestive heart failure) ([place]) 06/04/2013   Chronic kidney disease, unspecified    Coronary artery disease    non-obstructive by 2006 cath   Frequent UTI    "get them a couple times/yr" (11/11/2012)   Hemorrhage of rectum and anus    High cholesterol    History of blood transfusion 04/2011   "after hip OR" (11/11/2012)   Hypertension 05/20/2011   Hypertr obst cardiomyop    Hypotension, unspecified    cardiac cath 2006..nonobstructive CAD 30-40s lesions.Marland KitchenETT 1/09 nondiagnostic due to poor HR response..Right Renal Cancer 2003   Long term (current) use of anticoagulants    Malignant neoplasm of kidney, except pelvis    Osteoarthritis of right hip    Other and unspecified coagulation defects    PONV (postoperative nausea and vomiting)    Renal cancer ([place]) 06/2001   Right   Secondary cardiomyopathy, unspecified    Sinus bradycardia    Urge incontinence     Patient Active Problem List   Diagnosis Date Noted   Acute CHF (congestive heart failure) ([place]) 02/25/2019   Acute on chronic diastolic CHF (congestive heart failure), NYHA class 4 ([place]) 11/04/2018   Postoperative anemia due to acute blood loss 11/04/2018   Chronic bilateral pleural effusions 11/04/2018   Shortness of breath 10/21/2018   Displaced fracture of left femoral neck (HCC) 10/09/2018   Somnolence    Closed left hip fracture ([place]) 10/07/2018   Fall    Hypokalemia    Afib ([place]) 06/01/2018   Urinary frequency 09/18/2017   Iliac artery stenosis, bilateral ([place]) 07/14/2017   Recurrent UTI 07/14/2017   Long term (current) use of anticoagulants 05/08/2017   Anemia 11/18/2016   Dysuria 11/18/2016   Pulmonary hypertension ([place]) 12/04/2015   B12 deficiency 05/22/2015   PAD (peripheral artery disease) (HCC)    Elevated serum creatinine    Renal cell carcinoma (HCC)    S/p nephrectomy    TIA (transient ischemic attack) 08/16/2014   Weakness 01/14/2014   Chronic kidney disease (CKD), stage IV (severe) ([place]) 12/08/2013   Preoperative cardiovascular examination 06/07/2013   Chronic diastolic CHF (congestive heart failure) ([place]) 06/04/2013   Mitral valve regurgitation 06/04/2013   Allergic rhinitis 01/01/2013   Chronic cholecystitis with calculus 09/15/2012   Claudication ([place]) 01/21/2012   Dizziness 11/26/2011   Hypertension 05/20/2011   Avascular necrosis of hip ([place]) 05/03/2011   OTHER AND UNSPECIFIED COAGULATION DEFECTS 10/13/2009  RECTAL BLEEDING 10/13/2009   HOCM / IHSS 07/14/2009   EDEMA 07/14/2009   Hyperlipidemia 04/10/2009   ADJUSTMENT DISORDER WITH ANXIETY 04/10/2009   INCONTINENCE, URGE 04/10/2009   Permanent atrial fibrillation 10/27/2008   SINUS BRADYCARDIA 10/27/2008   Carotid artery stenosis 10/27/2008   COPD (chronic obstructive pulmonary disease) ([place]) 10/27/2008   ICD (implantable cardioverter-defibrillator) in place 10/27/2008    Past  Surgical History:  Procedure Laterality Date   ABDOMINAL HYSTERECTOMY  1975   for benign causes   APPENDECTOMY     BI-VENTRICULAR PACEMAKER UPGRADE  05/04/2010   CARDIAC CATHETERIZATION  2006   CARDIOVERSION N/A 02/20/2018   Procedure: CARDIOVERSION;  Surgeon: Minna Merritts, MD;  Location: [facility];  Service: Cardiovascular;  Laterality: N/A;   CARDIOVERSION N/A 03/27/2018   Procedure: CARDIOVERSION;  Surgeon: Minna Merritts, MD;  Location: [facility];  Service: Cardiovascular;  Laterality: N/A;   CATARACT EXTRACTION W/ INTRAOCULAR LENS  IMPLANT, BILATERAL  01/2006-02-2006   CHOLECYSTECTOMY N/A 11/11/2012   Procedure: LAPAROSCOPIC CHOLECYSTECTOMY WITH INTRAOPERATIVE CHOLANGIOGRAM;  Surgeon: Imogene Burn. Georgette Dover, MD;  Location: [place];  Service: General;  Laterality: N/A;   EP IMPLANTABLE DEVICE N/A 02/21/2016   Procedure: ICD Generator Changeout;  Surgeon: Deboraha Sprang, MD;  Location: [facility];  Service: Cardiovascular;  Laterality: N/A;   INSERT / REPLACE / REMOVE PACEMAKER  05-01-11   02-28-05-/05-04-10-ICD-MEDTRONIC MAXIMAL DR   JOINT REPLACEMENT     LAPAROSCOPIC CHOLECYSTECTOMY  11/11/2012   LAPAROSCOPIC LYSIS OF ADHESIONS N/A 11/11/2012   Procedure: LAPAROSCOPIC LYSIS OF ADHESIONS;  Surgeon: Imogene Burn. Georgette Dover, MD;  Location: [place];  Service: General;  Laterality: N/A;   NEPHRECTOMY Right 06/2001    S/P RENAL CELL CANCER   PRESSURE SENSOR/CARDIOMEMS N/A 02/03/2019   Procedure: PRESSURE SENSOR/CARDIOMEMS;  Surgeon: Larey Dresser, MD;  Location: [place] [facility];  Service: Cardiovascular;  Laterality: N/A;   RIGHT HEART CATH N/A 11/09/2018   Procedure: RIGHT HEART CATH;  Surgeon: Larey Dresser, MD;  Location: [facility];  Service: Cardiovascular;  Laterality: N/A;   TOTAL HIP ARTHROPLASTY Right 05/03/2011   Procedure: TOTAL HIP ARTHROPLASTY ANTERIOR APPROACH;  Surgeon: Mcarthur Rossetti;  Location: [facility];  Service: Orthopedics;  Laterality: Right;   Removal of Cannulated Screws Right Hip, Right Direct Anterior Hip Replacement   TOTAL HIP ARTHROPLASTY Left 10/09/2018   Procedure: TOTAL HIP ARTHROPLASTY ANTERIOR APPROACH;  Surgeon: Rod Can, MD;  Location: [place];  Service: Orthopedics;  Laterality: Left;    Current Outpatient Rx   Order #: [id number] Class: No Print   Order #: [id number] Class: Historical Med   Order #: [id number] Class: Normal   Order #: [id number] Class: Print   Order #: [id number] Class: Historical Med   Order #: [id number] Class: Normal   Order #: [id number] Class: Historical Med   Order #: [id number] Class: Print   Order #: [id number] Class: Normal   Order #: [id number] Class: Historical Med   Order #: [id number] Class: Normal   Order #: [id number] Class: Historical Med   Order #: [id number] Class: Normal   Order #: [id number] Class: Normal   Order #: [id number] Class: No Print   Order #: [id number] Class: No Print   Order #: [id number] Class: Normal   Order #: [id number] Class: Normal   Order #: [id number] Class: No Print   Order #: [id number] Class: Historical Med   Order #: [id number] Class: No Print    Allergies Dilaudid [hydromorphone], Fentanyl, Codeine, Morphine and related, and Sulfonamide derivatives  Family History  Problem Relation Age of Onset   Heart failure Mother  Breast cancer Maternal Aunt 82   Breast cancer Cousin    Breast cancer Other    Colon cancer Neg Hx     Social History Social History   Tobacco Use   Smoking status: Former Smoker    Packs/day: 0.50    Years: 40.00    Pack years: 20.00    Types: Cigarettes    Quit date: 05/06/2001    Years since quitting: 17.8   Smokeless tobacco: Never Used  Substance Use Topics   Alcohol use: No    Alcohol/week: 0.0 standard drinks   Drug use: No    Review of Systems  All other systems negative except as documented in the HPI. All pertinent positives and negatives as reviewed in the  HPI. ____________________________________________   PHYSICAL EXAM:  VITAL SIGNS: ED Triage Vitals  Enc Vitals Group     BP 02/24/19 2022 106/63     Pulse Rate 02/24/19 2022 70     Resp 02/24/19 2022 (!) 22     Temp 02/24/19 2022 (!) 97.4 F (36.3 C)     Temp Source 02/24/19 2022 Oral     SpO2 02/24/19 2022 95 %     Weight --      Height --      Head Circumference --      Peak Flow --      Pain Score 02/24/19 2038 0     Pain Loc --      Pain Edu? --      Excl. in [place]? --     Constitutional: Alert and oriented. Well appearing and in no acute distress. Eyes: Conjunctivae are normal. PERRL. EOMI. Head: Atraumatic. Nose: No congestion/rhinnorhea. Mouth/Throat: Mucous membranes are moist.  Oropharynx non-erythematous. Neck: No stridor.  No meningeal signs.   Cardiovascular: Normal rate, regular rhythm. Good peripheral circulation. Grossly normal heart sounds.   Respiratory: tachypneic respiratory effort.  No retractions. Lungs CTAB. Gastrointestinal: Soft and nontender. No distention.  Musculoskeletal: No lower extremity tenderness but has L>R LE edema. No gross deformities of extremities. Neurologic:  Normal speech and language. No gross focal neurologic deficits are appreciated.  Skin:  Erythema ble. Ulcers ble. Also with yellow clear weepign fluid bilaterally.   ____________________________________________   LABS (all labs ordered are listed, but only abnormal results are displayed)  Labs Reviewed  BASIC METABOLIC PANEL - Abnormal; Notable for the following components:      Result Value   Chloride 96 (*)    Glucose, Bld 107 (*)    BUN 63 (*)    Creatinine, Ser 1.62 (*)    GFR calc non Af Amer 29 (*)    GFR calc Af Amer 34 (*)    All other components within normal limits  CBC - Abnormal; Notable for the following components:   RBC 2.69 (*)    Hemoglobin 6.2 (*)    HCT 21.5 (*)    MCV 79.9 (*)    MCH 23.0 (*)    MCHC 28.8 (*)    RDW 17.2 (*)    nRBC 0.5 (*)     All other components within normal limits  BRAIN NATRIURETIC PEPTIDE - Abnormal; Notable for the following components:   B Natriuretic Peptide 314.7 (*)    All other components within normal limits  PROTIME-INR - Abnormal; Notable for the following components:   Prothrombin Time 26.2 (*)    INR 2.5 (*)    All other components within normal limits  TROPONIN I (HIGH SENSITIVITY) - Abnormal;  Notable for the following components:   Troponin I (High Sensitivity) 74 (*)    All other components within normal limits  VITAMIN B12  FOLATE  IRON AND TIBC  FERRITIN  RETICULOCYTES  TYPE AND SCREEN  PREPARE RBC (CROSSMATCH)  TROPONIN I (HIGH SENSITIVITY)   ____________________________________________  EKG   EKG Interpretation  Date/Time:  Wednesday February 24 2019 20:31:22 EDT Ventricular Rate:  75 PR Interval:    QRS Duration: 138 QT Interval:  434 QTC Calculation: 484 R Axis:   -118 Text Interpretation:  Atrial fibrillation Right superior axis deviation Non-specific intra-ventricular conduction block Minimal voltage criteria for LVH, may be normal variant ( Cornell product ) Nonspecific T wave abnormality Abnormal ECG No significant change since last tracing Confirmed by Merrily Pew [phone]) on 02/24/2019 11:58:36 PM       ____________________________________________  RADIOLOGY  Dg Chest 2 View  Result Date: 02/24/2019 CLINICAL DATA:  Worsening shortness of breath 1 week. Recent fall from chair. EXAM: CHEST - 2 VIEW COMPARISON:  11/04/2018 FINDINGS: Stable mild cardiomegaly. AICD remains in appropriate position stable small bilateral pleural effusions versus pleural thickening. No evidence of acute infiltrate or edema. IMPRESSION: Stable mild cardiomegaly and small bilateral pleural effusions versus pleural thickening. No acute findings. Electronically Signed   By: Marlaine Hind M.D.   On: 02/24/2019 20:59   Ct Head Wo Contrast  Result Date: 02/24/2019 CLINICAL DATA:  Head  trauma, X ataxia EXAM: CT HEAD WITHOUT CONTRAST TECHNIQUE: Contiguous axial images were obtained from the base of the skull through the vertex without intravenous contrast. COMPARISON:  October 28, 2016 FINDINGS: Brain: No evidence of acute territorial infarction, hemorrhage, hydrocephalus,extra-axial collection or mass lesion/mass effect. There is dilatation the ventricles and sulci consistent with age-related atrophy. Low-attenuation changes in the deep white matter consistent with small vessel ischemia. Vascular: No hyperdense vessel or unexpected calcification. Skull: The skull is intact. No fracture or focal lesion identified. Sinuses/Orbits: The visualized paranasal sinuses and mastoid air cells are clear. The orbits and globes intact. Other: None IMPRESSION: No acute intracranial abnormality. Findings consistent with age related atrophy and chronic small vessel ischemia Electronically Signed   By: Prudencio Pair M.D.   On: 02/24/2019 21:25    ____________________________________________   PROCEDURES  Procedure(s) performed:   .Critical Care Performed by: Merrily Pew, MD Authorized by: Merrily Pew, MD   Critical care provider statement:    Critical care time (minutes):  45   Critical care was necessary to treat or prevent imminent or life-threatening deterioration of the following conditions:  Circulatory failure and dehydration   Critical care was time spent personally by me on the following activities:  Discussions with consultants, evaluation of patient's response to treatment, examination of patient, ordering and performing treatments and interventions, ordering and review of laboratory studies, ordering and review of radiographic studies, pulse oximetry, re-evaluation of patient's condition, obtaining history from patient or surrogate and review of old charts   ____________________________________________   INITIAL IMPRESSION / [place] / ED COURSE  Patient is here with  progressive worsening dyspnea on exertion for the last few weeks.  Similar to previous episodes of anemia.  She also has worsening lower extremity edema with wounds associated same.  She has pretty significant neuropathic pain in her legs as well.  She has dark stools but she is been on iron for a long time and she has had multiple stool studies done in the past that were negative for blood. Suspect her symptoms are likely  combination of heart failure and anemia.  Will transfuse 2 units here and give her 80 of IV Lasix.  I do not believe that this will turn around completely and she will need at least observation in the hospital for further reevaluation and management for symptoms. Of note, she also fell on coumadin. CT ok.    Pertinent labs & imaging results that were available during my care of the patient were reviewed by me and considered in my medical decision making (see chart for details).  ____________________________________________  FINAL CLINICAL IMPRESSION(S) / ED DIAGNOSES  Final diagnoses:  Peripheral edema  Anemia, unspecified type     MEDICATIONS GIVEN DURING THIS VISIT:  Medications  sodium chloride flush (NS) 0.9 % injection 3 mL (has no administration in time range)  0.9 %  sodium chloride infusion (has no administration in time range)  furosemide (LASIX) injection 80 mg (has no administration in time range)     NEW OUTPATIENT MEDICATIONS STARTED DURING THIS VISIT:  New Prescriptions   No medications on file    Note:  This note was prepared with assistance of Dragon voice recognition software. Occasional wrong-word or sound-a-like substitutions may have occurred due to the inherent limitations of voice recognition software.   Lorea Kupfer, Corene Cornea, MD 02/25/19 [place]

## 2019-02-24 NOTE — Telephone Encounter (Signed)
Spoke with patient's grand daughter who reports that patient has been very fatigued today, had virtual visit with cardiology yesterday, evidence of fluid overload, diuretics increased. Increased weight and leg swelling, open wounds on legs. Had been seeing [facility] x 1. Patient had to pay over $200 out of pocket which is unaffordable.  Patient fell our of recliner and hit her head night before last. Today with nausea. She is on warfarin. Given worsening symptoms, recent fall with head injury, recommended that patient be taken to ER. Her grand daughter is agreeable.

## 2019-02-24 NOTE — ED Triage Notes (Addendum)
Patient reports worsening SOB with edema at lower legs and fatigue onset last week , history of CHF , patient added fall incident yesterday morning at home - hit her head against the floor with no LOC with mild headache /low back pain . RN consulted Law PA at triage - ordered CT head.

## 2019-02-25 ENCOUNTER — Inpatient Hospital Stay (HOSPITAL_COMMUNITY): Payer: PPO

## 2019-02-25 ENCOUNTER — Encounter (HOSPITAL_COMMUNITY): Payer: Self-pay | Admitting: Internal Medicine

## 2019-02-25 DIAGNOSIS — I272 Pulmonary hypertension, unspecified: Secondary | ICD-10-CM | POA: Diagnosis not present

## 2019-02-25 DIAGNOSIS — L03116 Cellulitis of left lower limb: Secondary | ICD-10-CM | POA: Diagnosis not present

## 2019-02-25 DIAGNOSIS — K5521 Angiodysplasia of colon with hemorrhage: Secondary | ICD-10-CM | POA: Diagnosis not present

## 2019-02-25 DIAGNOSIS — L03115 Cellulitis of right lower limb: Secondary | ICD-10-CM | POA: Diagnosis not present

## 2019-02-25 DIAGNOSIS — R109 Unspecified abdominal pain: Secondary | ICD-10-CM | POA: Diagnosis not present

## 2019-02-25 DIAGNOSIS — K5730 Diverticulosis of large intestine without perforation or abscess without bleeding: Secondary | ICD-10-CM | POA: Diagnosis not present

## 2019-02-25 DIAGNOSIS — E871 Hypo-osmolality and hyponatremia: Secondary | ICD-10-CM | POA: Diagnosis not present

## 2019-02-25 DIAGNOSIS — I4821 Permanent atrial fibrillation: Secondary | ICD-10-CM | POA: Diagnosis not present

## 2019-02-25 DIAGNOSIS — I10 Essential (primary) hypertension: Secondary | ICD-10-CM | POA: Diagnosis not present

## 2019-02-25 DIAGNOSIS — I87332 Chronic venous hypertension (idiopathic) with ulcer and inflammation of left lower extremity: Secondary | ICD-10-CM | POA: Diagnosis not present

## 2019-02-25 DIAGNOSIS — I5043 Acute on chronic combined systolic (congestive) and diastolic (congestive) heart failure: Secondary | ICD-10-CM | POA: Diagnosis not present

## 2019-02-25 DIAGNOSIS — D649 Anemia, unspecified: Secondary | ICD-10-CM | POA: Diagnosis not present

## 2019-02-25 DIAGNOSIS — I509 Heart failure, unspecified: Secondary | ICD-10-CM

## 2019-02-25 DIAGNOSIS — L97929 Non-pressure chronic ulcer of unspecified part of left lower leg with unspecified severity: Secondary | ICD-10-CM | POA: Diagnosis not present

## 2019-02-25 DIAGNOSIS — D62 Acute posthemorrhagic anemia: Secondary | ICD-10-CM | POA: Diagnosis not present

## 2019-02-25 DIAGNOSIS — I130 Hypertensive heart and chronic kidney disease with heart failure and stage 1 through stage 4 chronic kidney disease, or unspecified chronic kidney disease: Secondary | ICD-10-CM | POA: Diagnosis not present

## 2019-02-25 DIAGNOSIS — Z66 Do not resuscitate: Secondary | ICD-10-CM | POA: Diagnosis not present

## 2019-02-25 DIAGNOSIS — J90 Pleural effusion, not elsewhere classified: Secondary | ICD-10-CM | POA: Diagnosis not present

## 2019-02-25 DIAGNOSIS — N183 Chronic kidney disease, stage 3 unspecified: Secondary | ICD-10-CM | POA: Diagnosis not present

## 2019-02-25 DIAGNOSIS — I5033 Acute on chronic diastolic (congestive) heart failure: Secondary | ICD-10-CM | POA: Diagnosis not present

## 2019-02-25 DIAGNOSIS — Z885 Allergy status to narcotic agent status: Secondary | ICD-10-CM | POA: Diagnosis not present

## 2019-02-25 DIAGNOSIS — K2950 Unspecified chronic gastritis without bleeding: Secondary | ICD-10-CM | POA: Diagnosis not present

## 2019-02-25 DIAGNOSIS — I739 Peripheral vascular disease, unspecified: Secondary | ICD-10-CM | POA: Diagnosis not present

## 2019-02-25 DIAGNOSIS — K5520 Angiodysplasia of colon without hemorrhage: Secondary | ICD-10-CM | POA: Diagnosis not present

## 2019-02-25 DIAGNOSIS — Z20828 Contact with and (suspected) exposure to other viral communicable diseases: Secondary | ICD-10-CM | POA: Diagnosis not present

## 2019-02-25 DIAGNOSIS — I774 Celiac artery compression syndrome: Secondary | ICD-10-CM | POA: Diagnosis not present

## 2019-02-25 DIAGNOSIS — Z905 Acquired absence of kidney: Secondary | ICD-10-CM | POA: Diagnosis not present

## 2019-02-25 DIAGNOSIS — N179 Acute kidney failure, unspecified: Secondary | ICD-10-CM | POA: Diagnosis not present

## 2019-02-25 DIAGNOSIS — K551 Chronic vascular disorders of intestine: Secondary | ICD-10-CM | POA: Diagnosis not present

## 2019-02-25 DIAGNOSIS — K921 Melena: Secondary | ICD-10-CM | POA: Diagnosis not present

## 2019-02-25 DIAGNOSIS — Z85528 Personal history of other malignant neoplasm of kidney: Secondary | ICD-10-CM | POA: Diagnosis not present

## 2019-02-25 DIAGNOSIS — Z8249 Family history of ischemic heart disease and other diseases of the circulatory system: Secondary | ICD-10-CM | POA: Diagnosis not present

## 2019-02-25 DIAGNOSIS — K5732 Diverticulitis of large intestine without perforation or abscess without bleeding: Secondary | ICD-10-CM | POA: Diagnosis not present

## 2019-02-25 DIAGNOSIS — I2510 Atherosclerotic heart disease of native coronary artery without angina pectoris: Secondary | ICD-10-CM | POA: Diagnosis not present

## 2019-02-25 DIAGNOSIS — K529 Noninfective gastroenteritis and colitis, unspecified: Secondary | ICD-10-CM | POA: Diagnosis not present

## 2019-02-25 DIAGNOSIS — D500 Iron deficiency anemia secondary to blood loss (chronic): Secondary | ICD-10-CM | POA: Diagnosis not present

## 2019-02-25 DIAGNOSIS — K2970 Gastritis, unspecified, without bleeding: Secondary | ICD-10-CM | POA: Diagnosis not present

## 2019-02-25 DIAGNOSIS — D509 Iron deficiency anemia, unspecified: Secondary | ICD-10-CM | POA: Diagnosis not present

## 2019-02-25 DIAGNOSIS — R0602 Shortness of breath: Secondary | ICD-10-CM | POA: Diagnosis not present

## 2019-02-25 DIAGNOSIS — E039 Hypothyroidism, unspecified: Secondary | ICD-10-CM | POA: Diagnosis not present

## 2019-02-25 DIAGNOSIS — Z882 Allergy status to sulfonamides status: Secondary | ICD-10-CM | POA: Diagnosis not present

## 2019-02-25 DIAGNOSIS — N184 Chronic kidney disease, stage 4 (severe): Secondary | ICD-10-CM | POA: Diagnosis not present

## 2019-02-25 DIAGNOSIS — Z96643 Presence of artificial hip joint, bilateral: Secondary | ICD-10-CM | POA: Diagnosis not present

## 2019-02-25 DIAGNOSIS — I87339 Chronic venous hypertension (idiopathic) with ulcer and inflammation of unspecified lower extremity: Secondary | ICD-10-CM | POA: Diagnosis not present

## 2019-02-25 LAB — COMPREHENSIVE METABOLIC PANEL
ALT: 13 U/L (ref 0–44)
AST: 25 U/L (ref 15–41)
Albumin: 3.3 g/dL — ABNORMAL LOW (ref 3.5–5.0)
Alkaline Phosphatase: 55 U/L (ref 38–126)
Anion gap: 15 (ref 5–15)
BUN: 67 mg/dL — ABNORMAL HIGH (ref 8–23)
CO2: 26 mmol/L (ref 22–32)
Calcium: 9.2 mg/dL (ref 8.9–10.3)
Chloride: 97 mmol/L — ABNORMAL LOW (ref 98–111)
Creatinine, Ser: 1.75 mg/dL — ABNORMAL HIGH (ref 0.44–1.00)
GFR calc Af Amer: 31 mL/min — ABNORMAL LOW (ref >60)
GFR calc non Af Amer: 27 mL/min — ABNORMAL LOW (ref >60)
Glucose, Bld: 112 mg/dL — ABNORMAL HIGH (ref 70–99)
Potassium: 3.7 mmol/L (ref 3.5–5.1)
Sodium: 138 mmol/L (ref 135–145)
Total Bilirubin: 1.1 mg/dL (ref 0.3–1.2)
Total Protein: 6.2 g/dL — ABNORMAL LOW (ref 6.5–8.1)

## 2019-02-25 LAB — CBC WITH DIFFERENTIAL/PLATELET
Abs Immature Granulocytes: 0.02 10*3/uL (ref 0.00–0.07)
Basophils Absolute: 0 10*3/uL (ref 0.0–0.1)
Basophils Relative: 1 %
Eosinophils Absolute: 0 10*3/uL (ref 0.0–0.5)
Eosinophils Relative: 1 %
HCT: 19.8 % — ABNORMAL LOW (ref 36.0–46.0)
Hemoglobin: 5.8 g/dL — CL (ref 12.0–15.0)
Immature Granulocytes: 0 %
Lymphocytes Relative: 16 %
Lymphs Abs: 0.9 10*3/uL (ref 0.7–4.0)
MCH: 23.5 pg — ABNORMAL LOW (ref 26.0–34.0)
MCHC: 29.3 g/dL — ABNORMAL LOW (ref 30.0–36.0)
MCV: 80.2 fL (ref 80.0–100.0)
Monocytes Absolute: 0.9 10*3/uL (ref 0.1–1.0)
Monocytes Relative: 16 %
Neutro Abs: 3.8 10*3/uL (ref 1.7–7.7)
Neutrophils Relative %: 66 %
Platelets: 328 10*3/uL (ref 150–400)
RBC: 2.47 MIL/uL — ABNORMAL LOW (ref 3.87–5.11)
RDW: 17.3 % — ABNORMAL HIGH (ref 11.5–15.5)
WBC: 5.8 10*3/uL (ref 4.0–10.5)
nRBC: 0.7 % — ABNORMAL HIGH (ref 0.0–0.2)

## 2019-02-25 LAB — RETICULOCYTES
Immature Retic Fract: 40.3 % — ABNORMAL HIGH (ref 2.3–15.9)
RBC.: 2.65 MIL/uL — ABNORMAL LOW (ref 3.87–5.11)
Retic Count, Absolute: 122.2 10*3/uL (ref 19.0–186.0)
Retic Ct Pct: 4.6 % — ABNORMAL HIGH (ref 0.4–3.1)

## 2019-02-25 LAB — PROTIME-INR
INR: 2.4 — ABNORMAL HIGH (ref 0.8–1.2)
Prothrombin Time: 25.8 seconds — ABNORMAL HIGH (ref 11.4–15.2)

## 2019-02-25 LAB — FOLATE: Folate: 17.4 ng/mL (ref >5.9)

## 2019-02-25 LAB — PREPARE RBC (CROSSMATCH)

## 2019-02-25 LAB — IRON AND TIBC
Iron: 28 ug/dL (ref 28–170)
Saturation Ratios: 7 % — ABNORMAL LOW (ref 10.4–31.8)
TIBC: 421 ug/dL (ref 250–450)
UIBC: 393 ug/dL

## 2019-02-25 LAB — SARS CORONAVIRUS 2 (TAT 6-24 HRS): SARS Coronavirus 2: NEGATIVE

## 2019-02-25 LAB — VITAMIN B12: Vitamin B-12: 233 pg/mL (ref 180–914)

## 2019-02-25 LAB — TSH: TSH: 3.968 u[IU]/mL (ref 0.350–4.500)

## 2019-02-25 LAB — FERRITIN: Ferritin: 13 ng/mL (ref 11–307)

## 2019-02-25 LAB — TROPONIN I (HIGH SENSITIVITY): Troponin I (High Sensitivity): 70 ng/L — ABNORMAL HIGH (ref <18)

## 2019-02-25 MED ORDER — ACETAMINOPHEN 650 MG RE SUPP: 650.0000 mg | Freq: Four times a day (QID) | RECTAL | Status: DC | PRN

## 2019-02-25 MED ORDER — ROSUVASTATIN CALCIUM 20 MG PO TABS
40.0000 mg | ORAL_TABLET | Freq: Every day | ORAL | Status: DC
  Administered 2019-02-25 – 2019-03-17 (×21): 40 mg via ORAL
  Filled 2019-02-25 (×21): qty 2

## 2019-02-25 MED ORDER — EZETIMIBE 10 MG PO TABS
10.0000 mg | ORAL_TABLET | Freq: Every day | ORAL | Status: DC
  Administered 2019-02-25 – 2019-03-17 (×21): 10 mg via ORAL
  Filled 2019-02-25 (×21): qty 1

## 2019-02-25 MED ORDER — SPIRONOLACTONE 25 MG PO TABS
25.0000 mg | ORAL_TABLET | Freq: Every day | ORAL | Status: DC
  Administered 2019-02-25 – 2019-02-26 (×2): 25 mg via ORAL
  Filled 2019-02-25 (×2): qty 1

## 2019-02-25 MED ORDER — ACETAMINOPHEN 500 MG PO TABS
1000.0000 mg | ORAL_TABLET | Freq: Three times a day (TID) | ORAL | Status: DC
  Administered 2019-02-25: 1000 mg via ORAL
  Filled 2019-02-25: qty 2

## 2019-02-25 MED ORDER — DOCUSATE SODIUM 100 MG PO CAPS
100.0000 mg | ORAL_CAPSULE | Freq: Two times a day (BID) | ORAL | Status: DC
  Administered 2019-02-26 – 2019-03-15 (×14): 100 mg via ORAL
  Filled 2019-02-25 (×37): qty 1

## 2019-02-25 MED ORDER — LEVOTHYROXINE SODIUM 25 MCG PO TABS
25.0000 ug | ORAL_TABLET | Freq: Every day | ORAL | Status: DC
  Administered 2019-02-25 – 2019-03-17 (×21): 25 ug via ORAL
  Filled 2019-02-25 (×21): qty 1

## 2019-02-25 MED ORDER — FUROSEMIDE 10 MG/ML IJ SOLN
80.0000 mg | Freq: Two times a day (BID) | INTRAMUSCULAR | Status: DC
  Administered 2019-02-25: 80 mg via INTRAVENOUS

## 2019-02-25 MED ORDER — ACETAMINOPHEN 500 MG PO TABS
500.0000 mg | ORAL_TABLET | Freq: Four times a day (QID) | ORAL | Status: AC
  Administered 2019-02-25 – 2019-02-28 (×10): 500 mg via ORAL
  Filled 2019-02-25 (×11): qty 1

## 2019-02-25 MED ORDER — LORATADINE 10 MG PO TABS
10.0000 mg | ORAL_TABLET | Freq: Every day | ORAL | Status: DC
  Administered 2019-02-25 – 2019-03-17 (×21): 10 mg via ORAL
  Filled 2019-02-25 (×21): qty 1

## 2019-02-25 MED ORDER — BISACODYL 5 MG PO TBEC: 5.0000 mg | DELAYED_RELEASE_TABLET | Freq: Every day | ORAL | Status: DC | PRN

## 2019-02-25 MED ORDER — FLUTICASONE PROPIONATE 50 MCG/ACT NA SUSP
2.0000 | Freq: Every day | NASAL | Status: DC
  Administered 2019-02-26 – 2019-03-17 (×13): 2 via NASAL
  Filled 2019-02-25 (×2): qty 16

## 2019-02-25 MED ORDER — ONDANSETRON HCL 4 MG/2ML IJ SOLN
4.0000 mg | Freq: Four times a day (QID) | INTRAMUSCULAR | Status: DC | PRN
  Administered 2019-02-25 – 2019-03-13 (×6): 4 mg via INTRAVENOUS
  Filled 2019-02-25 (×6): qty 2

## 2019-02-25 MED ORDER — ONDANSETRON HCL 4 MG PO TABS: 4.0000 mg | ORAL_TABLET | Freq: Four times a day (QID) | ORAL | Status: DC | PRN

## 2019-02-25 MED ORDER — METOLAZONE 5 MG PO TABS
5.0000 mg | ORAL_TABLET | ORAL | Status: DC
  Administered 2019-02-25: 5 mg via ORAL
  Filled 2019-02-25 (×2): qty 1

## 2019-02-25 MED ORDER — ACETAMINOPHEN 325 MG PO TABS
650.0000 mg | ORAL_TABLET | Freq: Four times a day (QID) | ORAL | Status: DC | PRN
  Administered 2019-02-25 (×2): 650 mg via ORAL
  Filled 2019-02-25 (×2): qty 2

## 2019-02-25 MED ORDER — MAGNESIUM OXIDE 400 (241.3 MG) MG PO TABS
400.0000 mg | ORAL_TABLET | Freq: Every day | ORAL | Status: DC
  Administered 2019-02-25 – 2019-03-17 (×21): 400 mg via ORAL
  Filled 2019-02-25 (×21): qty 1

## 2019-02-25 MED ORDER — POTASSIUM CHLORIDE CRYS ER 20 MEQ PO TBCR
20.0000 meq | EXTENDED_RELEASE_TABLET | Freq: Two times a day (BID) | ORAL | Status: DC
  Administered 2019-02-25 – 2019-03-07 (×21): 20 meq via ORAL
  Filled 2019-02-25 (×18): qty 1
  Filled 2019-02-25: qty 2
  Filled 2019-02-25 (×3): qty 1

## 2019-02-25 MED ORDER — FUROSEMIDE 10 MG/ML IJ SOLN
80.0000 mg | Freq: Two times a day (BID) | INTRAMUSCULAR | Status: DC
  Administered 2019-02-26: 80 mg via INTRAVENOUS
  Filled 2019-02-25: qty 8

## 2019-02-25 MED ORDER — ASPIRIN 81 MG PO CHEW
81.0000 mg | CHEWABLE_TABLET | Freq: Every day | ORAL | Status: DC
  Administered 2019-02-25 – 2019-02-26 (×2): 81 mg via ORAL
  Filled 2019-02-25 (×2): qty 1

## 2019-02-25 MED ORDER — LIDOCAINE 5 % EX PTCH
1.0000 | MEDICATED_PATCH | CUTANEOUS | Status: DC
  Administered 2019-02-25 – 2019-03-17 (×21): 1 via TRANSDERMAL
  Filled 2019-02-25 (×21): qty 1

## 2019-02-25 MED ORDER — FUROSEMIDE 10 MG/ML IJ SOLN
80.0000 mg | Freq: Four times a day (QID) | INTRAMUSCULAR | Status: DC
  Administered 2019-02-25: 80 mg via INTRAVENOUS
  Filled 2019-02-25 (×2): qty 8

## 2019-02-25 MED ORDER — INFLUENZA VAC A&B SA ADJ QUAD 0.5 ML IM PRSY
0.5000 mL | PREFILLED_SYRINGE | INTRAMUSCULAR | Status: DC
  Filled 2019-02-25: qty 0.5

## 2019-02-25 NOTE — Consult Note (Signed)
[facility] Nurse wound consult note Patient receiving care in [place].  Consult completed remotely after review of record, including image. Reason for Consult: "Lower leg dried blister type wounds" Wound type: Highly likely related to chronic venous insufficiency  Wound bed: Only dried yellow crusting observed in photo Drainage (amount, consistency, odor) serous Periwound: hemosiderin staining Dressing procedure/placement/frequency:  Wash BLE with soap and water. Pat dry. Apply Sween Moisturizing ointment (pink and white tube in clean utility). Massage in. Cover any weeiping areas with Xeroform gauzes, then wrap in kerlex.  Monitor the wound area(s) for worsening of condition such as: Signs/symptoms of infection,  Increase in size,  Development of or worsening of odor, Development of pain, or increased pain at the affected locations.  Notify the medical team if any of these develop.  Thank you for the consult. [place] nurse will not follow at this time.  Please re-consult the [facility] team if needed.  Val Riles, RN, MSN, CWOCN, CNS-BC, pager [phone]

## 2019-02-25 NOTE — ED Notes (Signed)
Lasix to be given with the blood transfusion per Provider.

## 2019-02-25 NOTE — Progress Notes (Signed)
LE venous duplex attempted x 2 and patient refused both times. Please reorder if patient willing to have test.   June Leap, BS, RDMS, RVT Vascular lab

## 2019-02-25 NOTE — H&P (Signed)
History and Physical    [name] NAT:[id number] DOB: Oct 11, 1933 DOA: 02/24/2019  PCP: Elby Beck, FNP  Patient coming from: Home.  Chief Complaint: Fall weakness shortness of breath lower extremity edema.  HPI: [name] is a 85 y.o. female with history of A. fib, diastolic CHF last EF measured was 60 to 35% in June 2020, chronic anemia, hypothyroidism, chronic and disease stage III with single functioning kidney presents to the ER because of increasing weakness fall at home and shortness of breath with lower extremity edema.  Patient states over the last 2 weeks her shortness of breath has increased with exertion with no associated chest pain.  Denies any fever chills or productive cough.  Has been feeling increasingly fatigued.  Has been taking iron tablets and noted black stools.  No obvious bleeding otherwise.  Yesterday while sitting on a recliner patient dozed off and fell onto the floor hit her head and this made her come to the ER.  ED Course: In the ER CT head is unremarkable chest x-ray shows mild bilateral pleural effusion EKG shows A. fib rate controlled.  Labs reveal hemoglobin 6.2 platelet 302 last hemoglobin was around 9.  In September last month.  High-sensitivity troponin was 74 and 70.  BNP was 314.  Patient's creatinine is 1.6.  On exam patient has significant bilateral lower extremity edema.  Patient was ordered 2 units of PRBC transfusion anemia panel and Lasix 80 mg IV and admitted for further management of acute CHF anemia and lower extremity edema.  Review of Systems: As per HPI, rest all negative.   Past Medical History:  Diagnosis Date  . Adjustment disorder with anxiety   . Anginal pain ([place])   . Arthritis    "some in my hands" (11/11/2012)  . Atrial fibrillation ([place])   . Automatic implantable cardioverter-defibrillator in situ   . Blood in stool   . Carotid artery stenosis 09/2007   a. 09/2007: 60-79% bilateral (stable); b. 10/2008:  40-59% R 60-79%   . Chest pain, unspecified   . Chronic airway obstruction, not elsewhere classified   . Chronic bronchitis ([place])    "get it some; not q year" (11/11/2012)  . Chronic diastolic CHF (congestive heart failure) ([place]) 06/04/2013  . Chronic kidney disease, unspecified   . Coronary artery disease    non-obstructive by 2006 cath  . Frequent UTI    "get them a couple times/yr" (11/11/2012)  . Hemorrhage of rectum and anus   . High cholesterol   . History of blood transfusion 04/2011   "after hip OR" (11/11/2012)  . Hypertension 05/20/2011  . Hypertr obst cardiomyop   . Hypotension, unspecified    cardiac cath 2006..nonobstructive CAD 30-40s lesions.Marland KitchenETT 1/09 nondiagnostic due to poor HR response..Right Renal Cancer 2003  . Long term (current) use of anticoagulants   . Malignant neoplasm of kidney, except pelvis   . Osteoarthritis of right hip   . Other and unspecified coagulation defects   . PONV (postoperative nausea and vomiting)   . Renal cancer ([place]) 06/2001   Right  . Secondary cardiomyopathy, unspecified   . Sinus bradycardia   . Urge incontinence     Past Surgical History:  Procedure Laterality Date  . ABDOMINAL HYSTERECTOMY  1975   for benign causes  . APPENDECTOMY    . BI-VENTRICULAR PACEMAKER UPGRADE  05/04/2010  . CARDIAC CATHETERIZATION  2006  . CARDIOVERSION N/A 02/20/2018   Procedure: CARDIOVERSION;  Surgeon: Minna Merritts, MD;  Location: [facility];  Service: Cardiovascular;  Laterality: N/A;  . CARDIOVERSION N/A 03/27/2018   Procedure: CARDIOVERSION;  Surgeon: Minna Merritts, MD;  Location: [facility];  Service: Cardiovascular;  Laterality: N/A;  . CATARACT EXTRACTION W/ INTRAOCULAR LENS  IMPLANT, BILATERAL  01/2006-02-2006  . CHOLECYSTECTOMY N/A 11/11/2012   Procedure: LAPAROSCOPIC CHOLECYSTECTOMY WITH INTRAOPERATIVE CHOLANGIOGRAM;  Surgeon: Imogene Burn. Georgette Dover, MD;  Location: [place];  Service: General;  Laterality: N/A;  . EP IMPLANTABLE DEVICE N/A 02/21/2016    Procedure: ICD Generator Changeout;  Surgeon: Deboraha Sprang, MD;  Location: [facility];  Service: Cardiovascular;  Laterality: N/A;  . INSERT / REPLACE / REMOVE PACEMAKER  05-01-11   02-28-05-/05-04-10-ICD-MEDTRONIC MAXIMAL DR  . JOINT REPLACEMENT    . LAPAROSCOPIC CHOLECYSTECTOMY  11/11/2012  . LAPAROSCOPIC LYSIS OF ADHESIONS N/A 11/11/2012   Procedure: LAPAROSCOPIC LYSIS OF ADHESIONS;  Surgeon: Imogene Burn. Georgette Dover, MD;  Location: [place];  Service: General;  Laterality: N/A;  . NEPHRECTOMY Right 06/2001    S/P RENAL CELL CANCER  . PRESSURE SENSOR/CARDIOMEMS N/A 02/03/2019   Procedure: PRESSURE SENSOR/CARDIOMEMS;  Surgeon: Larey Dresser, MD;  Location: [place] [facility];  Service: Cardiovascular;  Laterality: N/A;  . RIGHT HEART CATH N/A 11/09/2018   Procedure: RIGHT HEART CATH;  Surgeon: Larey Dresser, MD;  Location: [place] [facility];  Service: Cardiovascular;  Laterality: N/A;  . TOTAL HIP ARTHROPLASTY Right 05/03/2011   Procedure: TOTAL HIP ARTHROPLASTY ANTERIOR APPROACH;  Surgeon: Mcarthur Rossetti;  Location: [facility];  Service: Orthopedics;  Laterality: Right;  Removal of Cannulated Screws Right Hip, Right Direct Anterior Hip Replacement  . TOTAL HIP ARTHROPLASTY Left 10/09/2018   Procedure: TOTAL HIP ARTHROPLASTY ANTERIOR APPROACH;  Surgeon: Rod Can, MD;  Location: [place];  Service: Orthopedics;  Laterality: Left;     reports that she quit smoking about 17 years ago. Her smoking use included cigarettes. She has a 20.00 pack-year smoking history. She has never used smokeless tobacco. She reports that she does not drink alcohol or use drugs.  Allergies  Allergen Reactions  . Dilaudid [Hydromorphone] Other (See Comments)    Excessive Somnolence  Needing Narcan.  . Fentanyl Other (See Comments)    Required narcan when given with dilaudid. Tolerated single dose when given during procedure 02/03/2019.   Marland Kitchen Codeine Nausea And Vomiting    Hallucinations   . Morphine And  Related Nausea And Vomiting    Hallucinations   . Sulfonamide Derivatives Other (See Comments)    Dry mouth    Family History  Problem Relation Age of Onset  . Heart failure Mother   . Breast cancer Maternal Aunt 82  . Breast cancer Cousin   . Breast cancer Other   . Colon cancer Neg Hx     Prior to Admission medications   Medication Sig Start Date End Date Taking? Authorizing Provider  acetaminophen (TYLENOL) 325 MG tablet Take 2 tablets (650 mg total) by mouth every 6 (six) hours as needed for mild pain or moderate pain (or Fever >/= 101). 10/12/18   Aline August, MD  acetaminophen (TYLENOL) 500 MG tablet Take 1,000 mg by mouth every 8 (eight) hours as needed for moderate pain or headache.    [provider]  aspirin (ASPIRIN CHILDRENS) 81 MG chewable tablet Chew 1 tablet (81 mg total) by mouth daily. 02/03/19 02/03/20  Larey Dresser, MD  bisacodyl (DULCOLAX) 5 MG EC tablet Take 1 tablet (5 mg total) by mouth daily as needed for  moderate constipation. 10/12/18   Aline August, MD  cetirizine (ZYRTEC) 10 MG tablet Take 10 mg by mouth daily.     [provider]  conjugated estrogens (PREMARIN) vaginal cream Place 1 Applicatorful vaginally daily. Patient taking differently: Place 1 Applicatorful vaginally daily as needed (vaginal dryness).  07/31/18   Elby Beck, FNP  CRANBERRY PO Take 4 tablets by mouth daily.    [provider]  docusate sodium (COLACE) 100 MG capsule Take 1 capsule (100 mg total) by mouth 2 (two) times daily. 10/12/18   Aline August, MD  ezetimibe (ZETIA) 10 MG tablet Take 1 tablet (10 mg total) by mouth daily. 08/18/17   Minna Merritts, MD  Ferrous Gluconate-C-Folic Acid (IRON-C PO) Take 1 tablet by mouth daily.    [provider]  fluticasone (FLONASE) 50 MCG/ACT nasal spray Place 2 sprays into both nostrils daily. Patient taking differently: Place 2 sprays into both nostrils daily as needed for allergies or rhinitis.   04/22/18   Elby Beck, FNP  hydrocortisone 2.5 % cream Apply 1 application topically daily as needed (itching).  02/04/18   [provider]  levothyroxine (SYNTHROID) 25 MCG tablet Take 1 tablet (25 mcg total) by mouth daily before breakfast. 09/22/18   Deboraha Sprang, MD  magnesium oxide (MAG-OX) 400 MG tablet Take 1 tablet (400 mg total) by mouth daily. 06/05/18   Deboraha Sprang, MD  metolazone (ZAROXOLYN) 5 MG tablet Take 1 tablet (5 mg total) by mouth once a week. Every Thursday 02/23/19   Darrick Grinder D, NP  potassium chloride SA (K-DUR) 20 MEQ tablet Take 2 tablets (40 mEq total) by mouth every morning AND 1 tablet (20 mEq total) at bedtime. Take 1 tablet (20 meq) by mouth twice daily, except on metolazone days take 2 tablets (40 meq) twice daily. Patient taking differently: Take 40 meq by mouth twice daily 11/18/18   Larey Dresser, MD  rosuvastatin (CRESTOR) 40 MG tablet Take 1 tablet (40 mg total) by mouth daily. 11/11/18   Larey Dresser, MD  spironolactone (ALDACTONE) 25 MG tablet Take 1 tablet (25 mg total) by mouth daily. 11/18/18   Larey Dresser, MD  torsemide (DEMADEX) 20 MG tablet Take 5 tablets (100 mg total) by mouth 2 (two) times daily. 02/03/19   Larey Dresser, MD  vitamin C (ASCORBIC ACID) 250 MG tablet Take 500 mg by mouth daily.    [provider]  warfarin (COUMADIN) 2 MG tablet Take 1 tablet (2 mg total) by mouth as directed. Take 2 mg on Monday- Saturday and 1 Mg on Sunday Patient taking differently: Take 2-3 mg by mouth See admin instructions. Take 2 mg in the evening daily except Wed take 3 mg in the evening 10/12/18   Aline August, MD    Physical Exam: Constitutional: Moderately built and nourished. Vitals:   02/24/19 2022 02/24/19 2158 02/24/19 2353  BP: 106/63 108/61   Pulse: 70 76   Resp: (!) 22 18   Temp: (!) 97.4 F (36.3 C) (!) 97.5 F (36.4 C)   TempSrc: Oral Oral   SpO2: 95% 96%   Weight:   76.2 kg  Height:   5\' 6"  (1.676 m)    Eyes: Anicteric no pallor. ENMT: No discharge from the ears eyes nose and mouth: Neck: No mass felt.  No neck rigidity. Respiratory: No rhonchi or crepitations. Cardiovascular: S1-S2 heard. Abdomen: Soft nontender bowel sound present. Musculoskeletal: Bilateral lower extremity edema extending up to  the thigh. Skin: Chronic skin changes. Neurologic: Alert awake oriented to time place and person.  Moves all extremities. Psychiatric: Appears normal.   Labs on Admission: I have personally reviewed following labs and imaging studies  CBC: Recent Labs  Lab 02/24/19 2051  WBC 6.0  HGB 6.2*  HCT 21.5*  MCV 79.9*  PLT 229   Basic Metabolic Panel: Recent Labs  Lab 02/24/19 2051  NA 135  K 4.2  CL 96*  CO2 26  GLUCOSE 107*  BUN 63*  CREATININE 1.62*  CALCIUM 9.3   GFR: Estimated Creatinine Clearance: 27.9 mL/min (A) (by C-G formula based on SCr of 1.62 mg/dL (H)). Liver Function Tests: No results for input(s): AST, ALT, ALKPHOS, BILITOT, PROT, ALBUMIN in the last 168 hours. No results for input(s): LIPASE, AMYLASE in the last 168 hours. No results for input(s): AMMONIA in the last 168 hours. Coagulation Profile: Recent Labs  Lab 02/24/19 2051  INR 2.5*   Cardiac Enzymes: No results for input(s): CKTOTAL, CKMB, CKMBINDEX, TROPONINI in the last 168 hours. BNP (last 3 results) No results for input(s): PROBNP in the last 8760 hours. HbA1C: No results for input(s): HGBA1C in the last 72 hours. CBG: No results for input(s): GLUCAP in the last 168 hours. Lipid Profile: No results for input(s): CHOL, HDL, LDLCALC, TRIG, CHOLHDL, LDLDIRECT in the last 72 hours. Thyroid Function Tests: No results for input(s): TSH, T4TOTAL, FREET4, T3FREE, THYROIDAB in the last 72 hours. Anemia Panel: No results for input(s): VITAMINB12, FOLATE, FERRITIN, TIBC, IRON, RETICCTPCT in the last 72 hours. Urine analysis:    Component Value Date/Time   COLORURINE STRAW (A) 11/04/2018 2216    APPEARANCEUR CLEAR 11/04/2018 2216   APPEARANCEUR Cloudy (A) 01/27/2018 1330   LABSPEC 1.006 11/04/2018 2216   PHURINE 8.0 11/04/2018 2216   GLUCOSEU NEGATIVE 11/04/2018 2216   HGBUR NEGATIVE 11/04/2018 2216   HGBUR large 11/16/2009 1202   BILIRUBINUR neg 02/02/2019 1524   BILIRUBINUR Negative 01/27/2018 1330   KETONESUR NEGATIVE 11/04/2018 2216   PROTEINUR Negative 02/02/2019 1524   PROTEINUR NEGATIVE 11/04/2018 2216   UROBILINOGEN 0.2 02/02/2019 1524   UROBILINOGEN 1.0 08/16/2014 1754   NITRITE neg 02/02/2019 1524   NITRITE NEGATIVE 11/04/2018 2216   LEUKOCYTESUR Negative 02/02/2019 1524   LEUKOCYTESUR TRACE (A) 11/04/2018 2216   Sepsis Labs: @LABRCNTIP (procalcitonin:4,lacticidven:4) )No results found for this or any previous visit (from the past 240 hour(s)).   Radiological Exams on Admission: Dg Chest 2 View  Result Date: 02/24/2019 CLINICAL DATA:  Worsening shortness of breath 1 week. Recent fall from chair. EXAM: CHEST - 2 VIEW COMPARISON:  11/04/2018 FINDINGS: Stable mild cardiomegaly. AICD remains in appropriate position stable small bilateral pleural effusions versus pleural thickening. No evidence of acute infiltrate or edema. IMPRESSION: Stable mild cardiomegaly and small bilateral pleural effusions versus pleural thickening. No acute findings. Electronically Signed   By: Marlaine Hind M.D.   On: 02/24/2019 20:59   Ct Head Wo Contrast  Result Date: 02/24/2019 CLINICAL DATA:  Head trauma, X ataxia EXAM: CT HEAD WITHOUT CONTRAST TECHNIQUE: Contiguous axial images were obtained from the base of the skull through the vertex without intravenous contrast. COMPARISON:  October 28, 2016 FINDINGS: Brain: No evidence of acute territorial infarction, hemorrhage, hydrocephalus,extra-axial collection or mass lesion/mass effect. There is dilatation the ventricles and sulci consistent with age-related atrophy. Low-attenuation changes in the deep white matter consistent with small vessel  ischemia. Vascular: No hyperdense vessel or unexpected calcification. Skull: The skull is intact. No fracture or focal  lesion identified. Sinuses/Orbits: The visualized paranasal sinuses and mastoid air cells are clear. The orbits and globes intact. Other: None IMPRESSION: No acute intracranial abnormality. Findings consistent with age related atrophy and chronic small vessel ischemia Electronically Signed   By: Prudencio Pair M.D.   On: 02/24/2019 21:25    EKG: Independently reviewed.  A. fib rate controlled.  Assessment/Plan Principal Problem:   Acute on chronic diastolic CHF (congestive heart failure), NYHA class 4 (HCC) Active Problems:   Permanent atrial fibrillation   COPD (chronic obstructive pulmonary disease) (HCC)   Hypertension   Chronic kidney disease (CKD), stage IV (severe) (HCC)   PAD (peripheral artery disease) (HCC)   S/p nephrectomy   Pulmonary hypertension (HCC)   Acute CHF (congestive heart failure) (HCC)   Symptomatic anemia    1. Acute on chronic diastolic CHF last EF measured in June 2020 was 60 to 65% patient has been placed on Lasix 80 mg IV every 8 along with spironolactone and metolazone.  Closely follow intake output metabolic panel daily weights.  Patient does have significant lower extremity edema for which I have ordered Dopplers. 2. Anemia -we will check stool for occult blood follow anemia panel will receive 2 units of packed red blood cell transfusion follow CBC every transfusion. 3. A. Fib -rate controlled.  Has AICD/pacemaker.  Since patient hemoglobin is low and awaiting stool for blood we will keep patient on heparin until we make sure there is no significant fall in hemoglobin. 4. COPD not actively wheezing. 5. Chronic and disease stage III with single functioning kidney follow metabolic panel closely. 6. Hyperlipidemia on statins. 7. Hypothyroidism on Synthroid check TSH. 8. Fall likely from weakness and fatigue.  Patient still complains of low back  pain for which I have ordered CT scan.  Since patient has significant lower extremity edema with CHF and anemia will need more than 2 midnight stay and inpatient status.   DVT prophylaxis: Heparin. Code Status: DNR confirmed with patient. Family Communication: Discussed with patient. Disposition Plan: Home. Consults called: None. Admission status: Inpatient.   Rise Patience MD [facility] Pager [phone].  If 7PM-7AM, please contact night-coverage [email or web address] Password [facility]  02/25/2019, 12:17 AM

## 2019-02-25 NOTE — Progress Notes (Signed)
ANTICOAGULATION CONSULT NOTE - Initial Consult  Pharmacy Consult for Heparin Indication: atrial fibrillation  Allergies  Allergen Reactions  . Dilaudid [Hydromorphone] Other (See Comments)    Excessive Somnolence  Needing Narcan.  . Fentanyl Other (See Comments)    Required narcan when given with dilaudid. Tolerated single dose when given during procedure 02/03/2019.   Marland Kitchen Codeine Nausea And Vomiting    Hallucinations   . Morphine And Related Nausea And Vomiting    Hallucinations   . Sulfonamide Derivatives Other (See Comments)    Dry mouth    Patient Measurements: Height: 5\' 6"  (167.6 cm) Weight: 167 lb 15.9 oz (76.2 kg) IBW/kg (Calculated) : 59.3  Vital Signs: Temp: 97.8 F (36.6 C) (10/22 0118) Temp Source: Oral (10/22 0118) BP: 144/53 (10/22 0118) Pulse Rate: 94 (10/22 0118)  Labs: Recent Labs    02/24/19 2051 02/25/19 0002  HGB 6.2*  --   HCT 21.5*  --   PLT 309  --   LABPROT 26.2*  --   INR 2.5*  --   CREATININE 1.62*  --   TROPONINIHS 74* 70*    Estimated Creatinine Clearance: 27.9 mL/min (A) (by C-G formula based on SCr of 1.62 mg/dL (H)).   Medical History: Past Medical History:  Diagnosis Date  . Adjustment disorder with anxiety   . Anginal pain ([place])   . Arthritis    "some in my hands" (11/11/2012)  . Atrial fibrillation ([place])   . Automatic implantable cardioverter-defibrillator in situ   . Blood in stool   . Carotid artery stenosis 09/2007   a. 09/2007: 60-79% bilateral (stable); b. 10/2008: 40-59% R 60-79%   . Chest pain, unspecified   . Chronic airway obstruction, not elsewhere classified   . Chronic bronchitis ([place])    "get it some; not q year" (11/11/2012)  . Chronic diastolic CHF (congestive heart failure) ([place]) 06/04/2013  . Chronic kidney disease, unspecified   . Coronary artery disease    non-obstructive by 2006 cath  . Frequent UTI    "get them a couple times/yr" (11/11/2012)  . Hemorrhage of rectum and anus   . High cholesterol   .  History of blood transfusion 04/2011   "after hip OR" (11/11/2012)  . Hypertension 05/20/2011  . Hypertr obst cardiomyop   . Hypotension, unspecified    cardiac cath 2006..nonobstructive CAD 30-40s lesions.Marland KitchenETT 1/09 nondiagnostic due to poor HR response..Right Renal Cancer 2003  . Long term (current) use of anticoagulants   . Malignant neoplasm of kidney, except pelvis   . Osteoarthritis of right hip   . Other and unspecified coagulation defects   . PONV (postoperative nausea and vomiting)   . Renal cancer ([place]) 06/2001   Right  . Secondary cardiomyopathy, unspecified   . Sinus bradycardia   . Urge incontinence     Medications:  No current facility-administered medications on file prior to encounter.    Current Outpatient Medications on File Prior to Encounter  Medication Sig Dispense Refill  . acetaminophen (TYLENOL) 325 MG tablet Take 2 tablets (650 mg total) by mouth every 6 (six) hours as needed for mild pain or moderate pain (or Fever >/= 101).    Marland Kitchen acetaminophen (TYLENOL) 500 MG tablet Take 1,000 mg by mouth every 8 (eight) hours as needed for moderate pain or headache.    Marland Kitchen aspirin (ASPIRIN CHILDRENS) 81 MG chewable tablet Chew 1 tablet (81 mg total) by mouth daily. 36 tablet 11  . bisacodyl (DULCOLAX) 5 MG EC tablet Take 1 tablet (  5 mg total) by mouth daily as needed for moderate constipation. 10 tablet 0  . cetirizine (ZYRTEC) 10 MG tablet Take 10 mg by mouth daily.     Marland Kitchen conjugated estrogens (PREMARIN) vaginal cream Place 1 Applicatorful vaginally daily. (Patient taking differently: Place 1 Applicatorful vaginally daily as needed (vaginal dryness). ) 42.5 g 12  . CRANBERRY PO Take 4 tablets by mouth daily.    Marland Kitchen docusate sodium (COLACE) 100 MG capsule Take 1 capsule (100 mg total) by mouth 2 (two) times daily. 10 capsule 0  . ezetimibe (ZETIA) 10 MG tablet Take 1 tablet (10 mg total) by mouth daily. 30 tablet 11  . Ferrous Gluconate-C-Folic Acid (IRON-C PO) Take 1 tablet by mouth  daily.    . fluticasone (FLONASE) 50 MCG/ACT nasal spray Place 2 sprays into both nostrils daily. (Patient taking differently: Place 2 sprays into both nostrils daily as needed for allergies or rhinitis. ) 16 g 6  . hydrocortisone 2.5 % cream Apply 1 application topically daily as needed (itching).     Marland Kitchen levothyroxine (SYNTHROID) 25 MCG tablet Take 1 tablet (25 mcg total) by mouth daily before breakfast. 30 tablet 3  . magnesium oxide (MAG-OX) 400 MG tablet Take 1 tablet (400 mg total) by mouth daily. 30 tablet 2  . metolazone (ZAROXOLYN) 5 MG tablet Take 1 tablet (5 mg total) by mouth once a week. Every Thursday 13 tablet 3  . potassium chloride SA (K-DUR) 20 MEQ tablet Take 2 tablets (40 mEq total) by mouth every morning AND 1 tablet (20 mEq total) at bedtime. Take 1 tablet (20 meq) by mouth twice daily, except on metolazone days take 2 tablets (40 meq) twice daily. (Patient taking differently: Take 40 meq by mouth twice daily) 270 tablet 3  . rosuvastatin (CRESTOR) 40 MG tablet Take 1 tablet (40 mg total) by mouth daily. 90 tablet 3  . spironolactone (ALDACTONE) 25 MG tablet Take 1 tablet (25 mg total) by mouth daily. 90 tablet 3  . torsemide (DEMADEX) 20 MG tablet Take 5 tablets (100 mg total) by mouth 2 (two) times daily. 540 tablet 3  . vitamin C (ASCORBIC ACID) 250 MG tablet Take 500 mg by mouth daily.    Marland Kitchen warfarin (COUMADIN) 2 MG tablet Take 1 tablet (2 mg total) by mouth as directed. Take 2 mg on Monday- Saturday and 1 Mg on Sunday (Patient taking differently: Take 2-3 mg by mouth See admin instructions. Take 2 mg in the evening daily except Wed take 3 mg in the evening)       Assessment: 85 y.o. female admitted with CHF/anemia, h/o Afib and Coumadin on hold, for heparin once INR < 2  Goal of Therapy:  Heparin level 0.3-0.7 units/ml Monitor platelets by anticoagulation protocol: Yes   Plan:  F/U daily INR  Lamonte Hartt, Bronson Curb 02/25/2019,2:27 AM

## 2019-02-25 NOTE — Progress Notes (Addendum)
PROGRESS NOTE    [name]  NWG:[id number] DOB: 1933-12-31 DOA: 02/24/2019 PCP: Elby Beck, FNP   Brief Narrative:  [name] is a 85 y.o. female with history of A. fib, diastolic CHF last EF measured was 60 to 35% in June 2020, chronic anemia, hypothyroidism, chronic and disease stage III with single functioning kidney presents to the ER because of increasing weakness fall at home and shortness of breath with lower extremity edema.  Patient states over the last 2 weeks her shortness of breath has increased with exertion with no associated chest pain.  Denies any fever chills or productive cough.  Has been feeling increasingly fatigued.  Has been taking iron tablets and noted black stools.  No obvious bleeding otherwise.  Yesterday while sitting on a recliner patient dozed off and fell onto the floor hit her head and this made her come to the ER.  ED Course: In the ER CT head is unremarkable chest x-ray shows mild bilateral pleural effusion EKG shows A. fib rate controlled.  Labs reveal hemoglobin 6.2 platelet 302 last hemoglobin was around 9.  In September last month.  High-sensitivity troponin was 74 and 70.  BNP was 314.  Patient's creatinine is 1.6.  On exam patient has significant bilateral lower extremity edema.  Patient was ordered 2 units of PRBC transfusion anemia panel and Lasix 80 mg IV and admitted for further management of acute CHF anemia and lower extremity edema.  Assessment & Plan:   Principal Problem:   Acute on chronic diastolic CHF (congestive heart failure), NYHA class 4 (HCC) Active Problems:   Permanent atrial fibrillation   COPD (chronic obstructive pulmonary disease) (HCC)   Hypertension   Chronic kidney disease (CKD), stage IV (severe) (HCC)   PAD (peripheral artery disease) (HCC)   S/p nephrectomy   Pulmonary hypertension (HCC)   Acute CHF (congestive heart failure) (HCC)   Symptomatic anemia   1. Acute on chronic diastolic CHF last EF  measured in June 2020 was 60 to 65% 1. Lasix 80 IV BID ordered with spironolactone and metolazone (ordered only once weekly) 2. May need higher dose of lasix as on 100 torsemide BID at home, but will follow output with metolazone 3. I/O, daily weights 4. Pt refused dopplers  5. Consider cardiology c/s as needed (followed by HF yeam at last admission)  2. Anemia - With low normal iron, low ferritin, and low % sat, suggestive of iron def anemia.  Normal folate and low normal B12 (follow MMA).  Retics hypoproliferative.   1. 2 units pRBC ordered with lasix 2. Follow stool hemoccult - as she's on warfarin with significant iron def anemia, will consider GI consult in AM 3. Hold warfarin   3. AKI: suspect related to anemia and overload - baseline appears to be around 1.3-1.4. Follow with diuresis and transfusion.   4. A. Fib -rate controlled.  Has AICD/pacemaker.  Hold warfarin given significant iron def anemia above.  5. COPD not actively wheezing.  6. Chronic and disease stage III with single functioning kidney follow metabolic panel closely.  7. Hyperlipidemia on statins.  8. Hypothyroidism on Synthroid check TSH (wnl)  9. Fall likely from weakness and fatigue from anemia and HF.  Patient still complains of low back pain for which I have ordered CT scan (small bilateral effusions, s/p R nephrectomy, no hydro or obstruction - old L1 compression fx, no acute osseus abnormality).  10. LE wounds: follow wound care recs  DVT prophylaxis: SCD  Code Status: dnr Family Communication: none at bedside- discussed with granddaughter Disposition Plan: pending further improvement   Consultants:   none  Procedures:   none  Antimicrobials:  Anti-infectives (From admission, onward)   None     Subjective: Feels tired, worn down. Getting first unit of blood, hasn't noticed much difference yet.  Objective: Vitals:   02/25/19 0847 02/25/19 1155 02/25/19 1227 02/25/19 1636  BP: 118/79  (!) 119/49 (!) 104/59 (!) 113/50  Pulse: 75 62 63 66  Resp: (!) 22  18 18   Temp: (!) 97.5 F (36.4 C) 98.3 F (36.8 C) 97.8 F (36.6 C) 98.5 F (36.9 C)  TempSrc: Oral Oral Oral Oral  SpO2:  100% 100% (!) 83%  Weight:      Height:        Intake/Output Summary (Last 24 hours) at 02/25/2019 1703 Last data filed at 02/25/2019 1300 Gross per 24 hour  Intake 480 ml  Output 1450 ml  Net -970 ml   Filed Weights   02/24/19 2353 02/25/19 0556  Weight: 76.2 kg 73.1 kg    Examination:  General exam: Appears calm and comfortable  Respiratory system: Clear to auscultation. Respiratory effort normal. Cardiovascular system: S1 & S2 heard, RRR. Gastrointestinal system: Abdomen is nondistended, soft and nontender. Central nervous system: Alert and oriented. No focal neurological deficits. Extremities: bilateral LE edema, crusting wounds L>R Skin: No rashes, lesions or ulcers Psychiatry: Judgement and insight appear normal. Mood & affect appropriate.     Data Reviewed: I have personally reviewed following labs and imaging studies  CBC: Recent Labs  Lab 02/24/19 2051 02/25/19 0752  WBC 6.0 5.8  NEUTROABS  --  3.8  HGB 6.2* 5.8*  HCT 21.5* 19.8*  MCV 79.9* 80.2  PLT 309 151   Basic Metabolic Panel: Recent Labs  Lab 02/24/19 2051 02/25/19 0752  NA 135 138  K 4.2 3.7  CL 96* 97*  CO2 26 26  GLUCOSE 107* 112*  BUN 63* 67*  CREATININE 1.62* 1.75*  CALCIUM 9.3 9.2   GFR: Estimated Creatinine Clearance: 25.4 mL/min (A) (by C-G formula based on SCr of 1.75 mg/dL (H)). Liver Function Tests: Recent Labs  Lab 02/25/19 0752  AST 25  ALT 13  ALKPHOS 55  BILITOT 1.1  PROT 6.2*  ALBUMIN 3.3*   No results for input(s): LIPASE, AMYLASE in the last 168 hours. No results for input(s): AMMONIA in the last 168 hours. Coagulation Profile: Recent Labs  Lab 02/24/19 2051 02/25/19 0408  INR 2.5* 2.4*   Cardiac Enzymes: No results for input(s): CKTOTAL, CKMB, CKMBINDEX,  TROPONINI in the last 168 hours. BNP (last 3 results) No results for input(s): PROBNP in the last 8760 hours. HbA1C: No results for input(s): HGBA1C in the last 72 hours. CBG: No results for input(s): GLUCAP in the last 168 hours. Lipid Profile: No results for input(s): CHOL, HDL, LDLCALC, TRIG, CHOLHDL, LDLDIRECT in the last 72 hours. Thyroid Function Tests: Recent Labs    02/25/19 0752  TSH 3.968   Anemia Panel: Recent Labs    02/25/19 0002  VITAMINB12 233  FOLATE 17.4  FERRITIN 13  TIBC 421  IRON 28  RETICCTPCT 4.6*   Sepsis Labs: No results for input(s): PROCALCITON, LATICACIDVEN in the last 168 hours.  Recent Results (from the past 240 hour(s))  SARS CORONAVIRUS 2 (TAT 6-24 HRS) Nasopharyngeal Nasopharyngeal Swab     Status: None   Collection Time: 02/25/19 12:26 AM   Specimen: Nasopharyngeal Swab  Result Value Ref  Range Status   SARS Coronavirus 2 NEGATIVE NEGATIVE Final    Comment: (NOTE) SARS-CoV-2 target nucleic acids are NOT DETECTED. The SARS-CoV-2 RNA is generally detectable in upper and lower respiratory specimens during the acute phase of infection. Negative results do not preclude SARS-CoV-2 infection, do not rule out co-infections with other pathogens, and should not be used as the sole basis for treatment or other patient management decisions. Negative results must be combined with clinical observations, patient history, and epidemiological information. The expected result is Negative. Fact Sheet for Patients: SugarRoll.be Fact Sheet for Healthcare Providers: https://www.woods-mathews.com/ This test is not yet approved or cleared by the [place] FDA and  has been authorized for detection and/or diagnosis of SARS-CoV-2 by FDA under an Emergency Use Authorization (EUA). This EUA will remain  in effect (meaning this test can be used) for the duration of the COVID-19 declaration under Section 56 4(b)(1) of  the Act, 21 U.S.C. section 360bbb-3(b)(1), unless the authorization is terminated or revoked sooner. Performed at [facility], [place] [address] [place], [place] [address]          Radiology Studies: Dg Chest 2 View  Result Date: 02/24/2019 CLINICAL DATA:  Worsening shortness of breath 1 week. Recent fall from chair. EXAM: CHEST - 2 VIEW COMPARISON:  11/04/2018 FINDINGS: Stable mild cardiomegaly. AICD remains in appropriate position stable small bilateral pleural effusions versus pleural thickening. No evidence of acute infiltrate or edema. IMPRESSION: Stable mild cardiomegaly and small bilateral pleural effusions versus pleural thickening. No acute findings. Electronically Signed   By: Marlaine Hind M.D.   On: 02/24/2019 20:59   Ct Head Wo Contrast  Result Date: 02/24/2019 CLINICAL DATA:  Head trauma, X ataxia EXAM: CT HEAD WITHOUT CONTRAST TECHNIQUE: Contiguous axial images were obtained from the base of the skull through the vertex without intravenous contrast. COMPARISON:  October 28, 2016 FINDINGS: Brain: No evidence of acute territorial infarction, hemorrhage, hydrocephalus,extra-axial collection or mass lesion/mass effect. There is dilatation the ventricles and sulci consistent with age-related atrophy. Low-attenuation changes in the deep white matter consistent with small vessel ischemia. Vascular: No hyperdense vessel or unexpected calcification. Skull: The skull is intact. No fracture or focal lesion identified. Sinuses/Orbits: The visualized paranasal sinuses and mastoid air cells are clear. The orbits and globes intact. Other: None IMPRESSION: No acute intracranial abnormality. Findings consistent with age related atrophy and chronic small vessel ischemia Electronically Signed   By: Prudencio Pair M.D.   On: 02/24/2019 21:25   Ct Renal Stone Study  Result Date: 02/25/2019 CLINICAL DATA:  Flank pain. EXAM: CT ABDOMEN AND PELVIS WITHOUT CONTRAST TECHNIQUE: Multidetector CT imaging  of the abdomen and pelvis was performed following the standard protocol without IV contrast. COMPARISON:  July 22, 2005. FINDINGS: Lower chest: Small bilateral pleural effusions are noted. Hepatobiliary: No focal liver abnormality is seen. Status post cholecystectomy. No biliary dilatation. Pancreas: Unremarkable. No pancreatic ductal dilatation or surrounding inflammatory changes. Spleen: Normal in size without focal abnormality. Adrenals/Urinary Tract: Status post right nephrectomy. Right adrenal gland is not clearly visualized. Left adrenal gland and kidney are unremarkable. No hydronephrosis or renal obstruction is noted. Urinary bladder is not well visualized due to bilateral scatter artifact arising from bilateral hip prostheses. Stomach/Bowel: The stomach is unremarkable. There is no evidence of bowel obstruction or ileus. The appendix is not visualized. Vascular/Lymphatic: Aortic atherosclerosis. No enlarged abdominal or pelvic lymph nodes. Reproductive: Status post hysterectomy. No adnexal masses. Other: No abdominal wall hernia or abnormality. No abdominopelvic  ascites. Musculoskeletal: Old L1 compression fracture is noted. No acute osseous abnormality is noted. IMPRESSION: Small bilateral pleural effusions are noted. Status post right nephrectomy. No hydronephrosis or renal obstruction is noted. Aortic Atherosclerosis (ICD10-I70.0). Electronically Signed   By: Marijo Conception M.D.   On: 02/25/2019 07:59        Scheduled Meds: . acetaminophen  1,000 mg Oral Q8H  . aspirin  81 mg Oral Daily  . docusate sodium  100 mg Oral BID  . ezetimibe  10 mg Oral Daily  . fluticasone  2 spray Each Nare Daily  . furosemide  80 mg Intravenous Once  . furosemide  80 mg Intravenous Q6H  . [START ON 02/26/2019] influenza vaccine adjuvanted  0.5 mL Intramuscular Tomorrow-1000  . levothyroxine  25 mcg Oral QAC breakfast  . lidocaine  1 patch Transdermal Q24H  . loratadine  10 mg Oral Daily  . magnesium  oxide  400 mg Oral Daily  . metolazone  5 mg Oral Q Thu  . potassium chloride  20 mEq Oral BID  . rosuvastatin  40 mg Oral Daily  . sodium chloride flush  3 mL Intravenous Once  . spironolactone  25 mg Oral Daily   Continuous Infusions: . sodium chloride Stopped (02/25/19 0314)     LOS: 0 days    Time spent: over 30 min    Fayrene Helper, MD [facility] Pager AMION  If 7PM-7AM, please contact night-coverage [email or web address] Password [email or web address] 02/25/2019, 5:03 PM

## 2019-02-25 NOTE — Progress Notes (Signed)
Blood bank called about status of blood units. Blood bank states blood not yet available due to antibodies. Pt educated. Consent obtained from patient.

## 2019-02-26 DIAGNOSIS — I5033 Acute on chronic diastolic (congestive) heart failure: Secondary | ICD-10-CM | POA: Diagnosis not present

## 2019-02-26 DIAGNOSIS — D649 Anemia, unspecified: Secondary | ICD-10-CM | POA: Diagnosis not present

## 2019-02-26 LAB — URINALYSIS, ROUTINE W REFLEX MICROSCOPIC
Bacteria, UA: NONE SEEN
Bilirubin Urine: NEGATIVE
Glucose, UA: NEGATIVE mg/dL
Hgb urine dipstick: NEGATIVE
Ketones, ur: NEGATIVE mg/dL
Nitrite: NEGATIVE
Protein, ur: NEGATIVE mg/dL
Specific Gravity, Urine: 1.006 (ref 1.005–1.030)
pH: 6 (ref 5.0–8.0)

## 2019-02-26 LAB — COMPREHENSIVE METABOLIC PANEL
ALT: 14 U/L (ref 0–44)
AST: 30 U/L (ref 15–41)
Albumin: 3.2 g/dL — ABNORMAL LOW (ref 3.5–5.0)
Alkaline Phosphatase: 53 U/L (ref 38–126)
Anion gap: 11 (ref 5–15)
BUN: 65 mg/dL — ABNORMAL HIGH (ref 8–23)
CO2: 31 mmol/L (ref 22–32)
Calcium: 8.8 mg/dL — ABNORMAL LOW (ref 8.9–10.3)
Chloride: 93 mmol/L — ABNORMAL LOW (ref 98–111)
Creatinine, Ser: 1.77 mg/dL — ABNORMAL HIGH (ref 0.44–1.00)
GFR calc Af Amer: 30 mL/min — ABNORMAL LOW (ref >60)
GFR calc non Af Amer: 26 mL/min — ABNORMAL LOW (ref >60)
Glucose, Bld: 101 mg/dL — ABNORMAL HIGH (ref 70–99)
Potassium: 4 mmol/L (ref 3.5–5.1)
Sodium: 135 mmol/L (ref 135–145)
Total Bilirubin: 1.6 mg/dL — ABNORMAL HIGH (ref 0.3–1.2)
Total Protein: 5.8 g/dL — ABNORMAL LOW (ref 6.5–8.1)

## 2019-02-26 LAB — CBC
HCT: 24.8 % — ABNORMAL LOW (ref 36.0–46.0)
Hemoglobin: 7.5 g/dL — ABNORMAL LOW (ref 12.0–15.0)
MCH: 24.5 pg — ABNORMAL LOW (ref 26.0–34.0)
MCHC: 30.2 g/dL (ref 30.0–36.0)
MCV: 81 fL (ref 80.0–100.0)
Platelets: 226 10*3/uL (ref 150–400)
RBC: 3.06 MIL/uL — ABNORMAL LOW (ref 3.87–5.11)
RDW: 17.3 % — ABNORMAL HIGH (ref 11.5–15.5)
WBC: 6 10*3/uL (ref 4.0–10.5)
nRBC: 0 % (ref 0.0–0.2)

## 2019-02-26 LAB — PROTIME-INR
INR: 2.5 — ABNORMAL HIGH (ref 0.8–1.2)
Prothrombin Time: 26.6 seconds — ABNORMAL HIGH (ref 11.4–15.2)

## 2019-02-26 LAB — MAGNESIUM: Magnesium: 2.2 mg/dL (ref 1.7–2.4)

## 2019-02-26 MED ORDER — PHYTONADIONE 5 MG PO TABS
10.0000 mg | ORAL_TABLET | Freq: Once | ORAL | Status: AC
  Administered 2019-02-26: 10 mg via ORAL
  Filled 2019-02-26: qty 2

## 2019-02-26 MED ORDER — FUROSEMIDE 10 MG/ML IJ SOLN
100.0000 mg | Freq: Two times a day (BID) | INTRAVENOUS | Status: DC
  Administered 2019-02-26: 100 mg via INTRAVENOUS
  Filled 2019-02-26 (×3): qty 10

## 2019-02-26 NOTE — Progress Notes (Addendum)
PROGRESS NOTE    [name]  KVQ:[id number] DOB: August 25, 1933 DOA: 02/24/2019 PCP: Elby Beck, FNP   Brief Narrative:  [name] is [name] 85 y.o. female with history of Salley Boxley. fib, diastolic CHF last EF measured was 60 to 35% in June 2020, chronic anemia, hypothyroidism, chronic and disease stage III with single functioning kidney presents to the ER because of increasing weakness fall at home and shortness of breath with lower extremity edema.  Patient states over the last 2 weeks her shortness of breath has increased with exertion with no associated chest pain.  Denies any fever chills or productive cough.  Has been feeling increasingly fatigued.  Has been taking iron tablets and noted black stools.  No obvious bleeding otherwise.  Yesterday while sitting on [employer] recliner patient dozed off and fell onto the floor hit her head and this made her come to the ER.  ED Course: In the ER CT head is unremarkable chest x-ray shows mild bilateral pleural effusion EKG shows [name]. fib rate controlled.  Labs reveal hemoglobin 6.2 platelet 302 last hemoglobin was around 9.  In September last month.  High-sensitivity troponin was 74 and 70.  BNP was 314.  Patient's creatinine is 1.6.  On exam patient has significant bilateral lower extremity edema.  Patient was ordered 2 units of PRBC transfusion anemia panel and Lasix 80 mg IV and admitted for further management of acute CHF anemia and lower extremity edema.  Assessment & Plan:   Principal Problem:   Acute on chronic diastolic CHF (congestive heart failure), NYHA class 4 (HCC) Active Problems:   Permanent atrial fibrillation   COPD (chronic obstructive pulmonary disease) (HCC)   Hypertension   Chronic kidney disease (CKD), stage IV (severe) (HCC)   PAD (peripheral artery disease) (HCC)   S/p nephrectomy   Pulmonary hypertension (HCC)   Acute CHF (congestive heart failure) (HCC)   Symptomatic anemia   1. Acute on chronic diastolic CHF last EF  measured in June 2020 was 60 to 65% 1. Lasix 80 IV BID ordered with spironolactone and metolazone on hospital day 1 (metolazone ordered as home regimen once weekly on Thursday) 2. Will increase lasix to 100 mg BID and follow I/O  3. I/O, daily weights - was 154 lbs (69.9 kg) at discharge in July (will aim for this) 4. Pt refused dopplers, but LE swelling seems to be improving 5. Consider cardiology c/s as needed (followed by HF yeam at last admission) 6. S/p cardiomems - was on aspirin for this - will hold this at this time (discussed with Dr. [name])  2. Anemia - With low normal iron, low ferritin, and low % sat, suggestive of iron def anemia.  Normal folate and low normal B12 (follow MMA).  Retics hypoproliferative.   1. 2 units pRBC ordered with lasix 2. Follow stool hemoccult (pending collection) 3. GI consulted as pt on warfarin for afib with iron def anemia 4. Hold warfarin - INR therapeutic today, will allow to drift down 5. Apparently, Dr. [name] was planning on outpatient Heme evaluation as well   3. AKI: suspect related to anemia and overload - baseline appears to be around 1.3-1.4. Follow with diuresis and transfusion. 1.77 today, follow with continued diuresis.    4. [name]. Fib -rate controlled.  Has AICD/pacemaker.  Hold warfarin given significant iron def anemia above.  5. COPD not actively wheezing.  6. Chronic and disease stage III with single functioning kidney follow metabolic panel closely.  7. Hyperlipidemia  on statins.  8. Hypothyroidism on Synthroid check TSH (wnl)  9. Fall likely from weakness and fatigue from anemia and HF.  Patient still complains of low back pain for which I have ordered CT scan (small bilateral effusions, s/p R nephrectomy, no hydro or obstruction - old L1 compression fx, no acute osseus abnormality). 1. Symptoms improved with lidocaine patch  10. LE wounds: follow wound care recs    DVT prophylaxis: SCD Code Status: dnr Family Communication:  none at bedside- discussed with granddaughter 10/22 and 10/23 Disposition Plan: pending further improvement   Consultants:   none  Procedures:   none  Antimicrobials:  Anti-infectives (From admission, onward)   None     Subjective: Back pain is better Still feels tired Legs look [name] little better  Objective: Vitals:   02/26/19 0502 02/26/19 0547 02/26/19 0801 02/26/19 1218  BP: (!) 108/57 (!) 110/48 (!) 108/48 (!) 107/55  Pulse: 67 62 60 68  Resp:   18   Temp:  98.2 F (36.8 C)  98.4 F (36.9 C)  TempSrc:  Oral Oral Oral  SpO2: 100% 100% 100% 92%  Weight:  72.4 kg    Height:        Intake/Output Summary (Last 24 hours) at 02/26/2019 1314 Last data filed at 02/26/2019 1255 Gross per 24 hour  Intake 1490.42 ml  Output 2250 ml  Net -759.58 ml   Filed Weights   02/24/19 2353 02/25/19 0556 02/26/19 0547  Weight: 76.2 kg 73.1 kg 72.4 kg    Examination:  General: No acute distress. Cardiovascular: Heart sounds show [name] regular rate, and rhythm.  Lungs: Clear to auscultation bilaterally  Abdomen: Soft, nontender, nondistended  Neurological: Alert and oriented 3. Moves all extremities 4. Cranial nerves II through XII grossly intact. Skin:dried yellow crusting to L>R LE Extremities: bilateral LE edema 2+     Data Reviewed: I have personally reviewed following labs and imaging studies  CBC: Recent Labs  Lab 02/24/19 2051 02/25/19 0752 02/26/19 0736  WBC 6.0 5.8 6.0  NEUTROABS  --  3.8  --   HGB 6.2* 5.8* 7.5*  HCT 21.5* 19.8* 24.8*  MCV 79.9* 80.2 81.0  PLT 309 328 789   Basic Metabolic Panel: Recent Labs  Lab 02/24/19 2051 02/25/19 0752 02/26/19 0736  NA 135 138 135  K 4.2 3.7 4.0  CL 96* 97* 93*  CO2 26 26 31   GLUCOSE 107* 112* 101*  BUN 63* 67* 65*  CREATININE 1.62* 1.75* 1.77*  CALCIUM 9.3 9.2 8.8*  MG  --   --  2.2   GFR: Estimated Creatinine Clearance: 25 mL/min ([name]) (by C-G formula based on SCr of 1.77 mg/dL (H)). Liver Function  Tests: Recent Labs  Lab 02/25/19 0752 02/26/19 0736  AST 25 30  ALT 13 14  ALKPHOS 55 53  BILITOT 1.1 1.6*  PROT 6.2* 5.8*  ALBUMIN 3.3* 3.2*   No results for input(s): LIPASE, AMYLASE in the last 168 hours. No results for input(s): AMMONIA in the last 168 hours. Coagulation Profile: Recent Labs  Lab 02/24/19 2051 02/25/19 0408 02/26/19 0736  INR 2.5* 2.4* 2.5*   Cardiac Enzymes: No results for input(s): CKTOTAL, CKMB, CKMBINDEX, TROPONINI in the last 168 hours. BNP (last 3 results) No results for input(s): PROBNP in the last 8760 hours. HbA1C: No results for input(s): HGBA1C in the last 72 hours. CBG: No results for input(s): GLUCAP in the last 168 hours. Lipid Profile: No results for input(s): CHOL, HDL, LDLCALC, TRIG, CHOLHDL,  LDLDIRECT in the last 72 hours. Thyroid Function Tests: Recent Labs    02/25/19 0752  TSH 3.968   Anemia Panel: Recent Labs    02/25/19 0002  VITAMINB12 233  FOLATE 17.4  FERRITIN 13  TIBC 421  IRON 28  RETICCTPCT 4.6*   Sepsis Labs: No results for input(s): PROCALCITON, LATICACIDVEN in the last 168 hours.  Recent Results (from the past 240 hour(s))  SARS CORONAVIRUS 2 (TAT 6-24 HRS) Nasopharyngeal Nasopharyngeal Swab     Status: None   Collection Time: 02/25/19 12:26 AM   Specimen: Nasopharyngeal Swab  Result Value Ref Range Status   SARS Coronavirus 2 NEGATIVE NEGATIVE Final    Comment: (NOTE) SARS-CoV-2 target nucleic acids are NOT DETECTED. The SARS-CoV-2 RNA is generally detectable in upper and lower respiratory specimens during the acute phase of infection. Negative results do not preclude SARS-CoV-2 infection, do not rule out co-infections with other pathogens, and should not be used as the sole basis for treatment or other patient management decisions. Negative results must be combined with clinical observations, patient history, and epidemiological information. The expected result is Negative. Fact Sheet for  Patients: SugarRoll.be Fact Sheet for Healthcare Providers: https://www.woods-mathews.com/ This test is not yet approved or cleared by the [place] FDA and  has been authorized for detection and/or diagnosis of SARS-CoV-2 by FDA under an Emergency Use Authorization (EUA). This EUA will remain  in effect (meaning this test can be used) for the duration of the COVID-19 declaration under Section 56 4(b)(1) of the Act, 21 U.S.C. section 360bbb-3(b)(1), unless the authorization is terminated or revoked sooner. Performed at [facility], [place] [address], [place], [place] [address]          Radiology Studies: Dg Chest 2 View  Result Date: 02/24/2019 CLINICAL DATA:  Worsening shortness of breath 1 week. Recent fall from chair. EXAM: CHEST - 2 VIEW COMPARISON:  11/04/2018 FINDINGS: Stable mild cardiomegaly. AICD remains in appropriate position stable small bilateral pleural effusions versus pleural thickening. No evidence of acute infiltrate or edema. IMPRESSION: Stable mild cardiomegaly and small bilateral pleural effusions versus pleural thickening. No acute findings. Electronically Signed   By: Marlaine Hind M.D.   On: 02/24/2019 20:59   Ct Head Wo Contrast  Result Date: 02/24/2019 CLINICAL DATA:  Head trauma, X ataxia EXAM: CT HEAD WITHOUT CONTRAST TECHNIQUE: Contiguous axial images were obtained from the base of the skull through the vertex without intravenous contrast. COMPARISON:  October 28, 2016 FINDINGS: Brain: No evidence of acute territorial infarction, hemorrhage, hydrocephalus,extra-axial collection or mass lesion/mass effect. There is dilatation the ventricles and sulci consistent with age-related atrophy. Low-attenuation changes in the deep white matter consistent with small vessel ischemia. Vascular: No hyperdense vessel or unexpected calcification. Skull: The skull is intact. No fracture or focal lesion identified. Sinuses/Orbits:  The visualized paranasal sinuses and mastoid air cells are clear. The orbits and globes intact. Other: None IMPRESSION: No acute intracranial abnormality. Findings consistent with age related atrophy and chronic small vessel ischemia Electronically Signed   By: Prudencio Pair M.D.   On: 02/24/2019 21:25   Dg Chest Port 1 View  Result Date: 02/25/2019 CLINICAL DATA:  Hypoxia EXAM: PORTABLE CHEST 1 VIEW COMPARISON:  02/24/2019 FINDINGS: LEFT-sided AICD leads overlie the RIGHT atrium and RIGHT ventricle. Heart size is enlarged and stable in configuration. Stable appearance of pulmonary arterial pressure monitor. Small bilateral pleural effusions are stable. No edema or consolidation. IMPRESSION: Stable cardiomegaly and small bilateral pleural effusions. Electronically Signed  By: Nolon Nations M.D.   On: 02/25/2019 21:07   Ct Renal Stone Study  Result Date: 02/25/2019 CLINICAL DATA:  Flank pain. EXAM: CT ABDOMEN AND PELVIS WITHOUT CONTRAST TECHNIQUE: Multidetector CT imaging of the abdomen and pelvis was performed following the standard protocol without IV contrast. COMPARISON:  July 22, 2005. FINDINGS: Lower chest: Small bilateral pleural effusions are noted. Hepatobiliary: No focal liver abnormality is seen. Status post cholecystectomy. No biliary dilatation. Pancreas: Unremarkable. No pancreatic ductal dilatation or surrounding inflammatory changes. Spleen: Normal in size without focal abnormality. Adrenals/Urinary Tract: Status post right nephrectomy. Right adrenal gland is not clearly visualized. Left adrenal gland and kidney are unremarkable. No hydronephrosis or renal obstruction is noted. Urinary bladder is not well visualized due to bilateral scatter artifact arising from bilateral hip prostheses. Stomach/Bowel: The stomach is unremarkable. There is no evidence of bowel obstruction or ileus. The appendix is not visualized. Vascular/Lymphatic: Aortic atherosclerosis. No enlarged abdominal or pelvic  lymph nodes. Reproductive: Status post hysterectomy. No adnexal masses. Other: No abdominal wall hernia or abnormality. No abdominopelvic ascites. Musculoskeletal: Old L1 compression fracture is noted. No acute osseous abnormality is noted. IMPRESSION: Small bilateral pleural effusions are noted. Status post right nephrectomy. No hydronephrosis or renal obstruction is noted. Aortic Atherosclerosis (ICD10-I70.0). Electronically Signed   By: Marijo Conception M.D.   On: 02/25/2019 07:59        Scheduled Meds:  acetaminophen  500 mg Oral Q6H   aspirin  81 mg Oral Daily   docusate sodium  100 mg Oral BID   ezetimibe  10 mg Oral Daily   fluticasone  2 spray Each Nare Daily   furosemide  80 mg Intravenous Once   furosemide  80 mg Intravenous BID   influenza vaccine adjuvanted  0.5 mL Intramuscular Tomorrow-1000   levothyroxine  25 mcg Oral QAC breakfast   lidocaine  1 patch Transdermal Q24H   loratadine  10 mg Oral Daily   magnesium oxide  400 mg Oral Daily   metolazone  5 mg Oral Q Thu   potassium chloride  20 mEq Oral BID   rosuvastatin  40 mg Oral Daily   sodium chloride flush  3 mL Intravenous Once   spironolactone  25 mg Oral Daily   Continuous Infusions:  sodium chloride Stopped (02/25/19 0314)     LOS: 1 day    Time spent: over 30 min    Fayrene Helper, MD [facility] Pager AMION  If 7PM-7AM, please contact night-coverage [email or web address] Password [email or web address] 02/26/2019, 1:14 PM

## 2019-02-26 NOTE — Consult Note (Signed)
[facility] Consult: 12:53 PM 02/26/2019  LOS: 1 day    Referring Provider: Dr Florene Glen  Primary Care Physician:  Elby Beck, FNP Primary Gastroenterologist:  unassigned   Reason for Consultation: Normocytic anemia   HPI: [name] is a 85 y.o. female.  PMH A. fib.  Diastolic CHF.  Nonischemic cardiomyopathy.  S/p ICD.  PAD.  Chronic Coumadin.  COPD.  TIA.  CKD stage 3.  Chronic anemia, on oral iron.  Kidney cancer, s/p right nephrectomy.  Recurrent UTIs.  Cholecystectomy 2014.  10/2018 left total hip replacement following traumatic femoral neck fracture 10/2009 colonoscopy.  For minor rectal bleeding, Dr. Ardis Hughs.  Mild sigmoid, descending diverticulosis, otherwise normal study.  Admitted from ED yesterday with bilateral pleural effusions, LE edema, CHF, mild elevation high-sensitivity troponins anemia. Hgb 5.8 >> 2 PRBC >> 7.5.  MCV 80 hgb of 8.4 in early July, 10.1 after transfusion.  9.93 weeks ago.  Ferritin borderline low at 13.  Normal iron and TIBC.  Iron saturation low. AKI No outpt PPI, no H2 blocker  Transfused 2 RBCs in 2013, 2 units PRBCs 10/2018 at the time of admission for hip replacement.  1 PRBC on 11/11/2018.  FOBT negative in 11/2018 Renal studies CT shows bilateral pleural effusions, the right nephrectomy.  Stomach, intestines normal. Started on oral iron several months ago.  This makes her stools dark.  Periodically wipes and sees a little bit of blood on the tissue.  Daily bowel movements.  Fair appetite.  Weight has fluctuated with swelling in her legs contributing to weight increase.  No dysphagia.  No nausea or vomiting.  No heartburn.    Past Medical History:  Diagnosis Date   Adjustment disorder with anxiety    Anginal pain ([place])    Arthritis    "some in my hands"  (11/11/2012)   Atrial fibrillation (HCC)    Automatic implantable cardioverter-defibrillator in situ    Blood in stool    Carotid artery stenosis 09/2007   a. 09/2007: 60-79% bilateral (stable); b. 10/2008: 40-59% R 60-79%    Chest pain, unspecified    Chronic airway obstruction, not elsewhere classified    Chronic bronchitis ([place])    "get it some; not q year" (11/11/2012)   Chronic diastolic CHF (congestive heart failure) ([place]) 06/04/2013   Chronic kidney disease, unspecified    Coronary artery disease    non-obstructive by 2006 cath   Frequent UTI    "get them a couple times/yr" (11/11/2012)   Hemorrhage of rectum and anus    High cholesterol    History of blood transfusion 04/2011   "after hip OR" (11/11/2012)   Hypertension 05/20/2011   Hypertr obst cardiomyop    Hypotension, unspecified    cardiac cath 2006..nonobstructive CAD 30-40s lesions.Marland KitchenETT 1/09 nondiagnostic due to poor HR response..Right Renal Cancer 2003   Long term (current) use of anticoagulants    Malignant neoplasm of kidney, except pelvis    Osteoarthritis of right hip    Other and unspecified coagulation defects    PONV (postoperative nausea and vomiting)  Renal cancer ([place]) 06/2001   Right   Secondary cardiomyopathy, unspecified    Sinus bradycardia    Urge incontinence     Past Surgical History:  Procedure Laterality Date   ABDOMINAL HYSTERECTOMY  1975   for benign causes   APPENDECTOMY     BI-VENTRICULAR PACEMAKER UPGRADE  05/04/2010   CARDIAC CATHETERIZATION  2006   CARDIOVERSION N/A 02/20/2018   Procedure: CARDIOVERSION;  Surgeon: Minna Merritts, MD;  Location: [facility];  Service: Cardiovascular;  Laterality: N/A;   CARDIOVERSION N/A 03/27/2018   Procedure: CARDIOVERSION;  Surgeon: Minna Merritts, MD;  Location: [facility];  Service: Cardiovascular;  Laterality: N/A;   CATARACT EXTRACTION W/ INTRAOCULAR LENS  IMPLANT, BILATERAL  01/2006-02-2006   CHOLECYSTECTOMY N/A  11/11/2012   Procedure: LAPAROSCOPIC CHOLECYSTECTOMY WITH INTRAOPERATIVE CHOLANGIOGRAM;  Surgeon: Imogene Burn. Georgette Dover, MD;  Location: [place];  Service: General;  Laterality: N/A;   EP IMPLANTABLE DEVICE N/A 02/21/2016   Procedure: ICD Generator Changeout;  Surgeon: Deboraha Sprang, MD;  Location: [place] [facility];  Service: Cardiovascular;  Laterality: N/A;   INSERT / REPLACE / REMOVE PACEMAKER  05-01-11   02-28-05-/05-04-10-ICD-MEDTRONIC MAXIMAL DR   JOINT REPLACEMENT     LAPAROSCOPIC CHOLECYSTECTOMY  11/11/2012   LAPAROSCOPIC LYSIS OF ADHESIONS N/A 11/11/2012   Procedure: LAPAROSCOPIC LYSIS OF ADHESIONS;  Surgeon: Imogene Burn. Georgette Dover, MD;  Location: [place];  Service: General;  Laterality: N/A;   NEPHRECTOMY Right 06/2001    S/P RENAL CELL CANCER   PRESSURE SENSOR/CARDIOMEMS N/A 02/03/2019   Procedure: PRESSURE SENSOR/CARDIOMEMS;  Surgeon: Larey Dresser, MD;  Location: [facility];  Service: Cardiovascular;  Laterality: N/A;   RIGHT HEART CATH N/A 11/09/2018   Procedure: RIGHT HEART CATH;  Surgeon: Larey Dresser, MD;  Location: [facility];  Service: Cardiovascular;  Laterality: N/A;   TOTAL HIP ARTHROPLASTY Right 05/03/2011   Procedure: TOTAL HIP ARTHROPLASTY ANTERIOR APPROACH;  Surgeon: Mcarthur Rossetti;  Location: [facility];  Service: Orthopedics;  Laterality: Right;  Removal of Cannulated Screws Right Hip, Right Direct Anterior Hip Replacement   TOTAL HIP ARTHROPLASTY Left 10/09/2018   Procedure: TOTAL HIP ARTHROPLASTY ANTERIOR APPROACH;  Surgeon: Rod Can, MD;  Location: [place];  Service: Orthopedics;  Laterality: Left;    Prior to Admission medications   Medication Sig Start Date End Date Taking? Authorizing Provider  acetaminophen (TYLENOL) 500 MG tablet Take 500-1,000 mg by mouth every 8 (eight) hours as needed for mild pain or headache.    Yes [provider]  aspirin (ASPIRIN CHILDRENS) 81 MG chewable tablet Chew 1 tablet (81 mg total) by mouth daily.  02/03/19 02/03/20 Yes Larey Dresser, MD  cetirizine (ZYRTEC) 10 MG tablet Take 10 mg by mouth daily.    Yes [provider]  conjugated estrogens (PREMARIN) vaginal cream Place 1 Applicatorful vaginally daily. Patient taking differently: Place 1 Applicatorful vaginally daily as needed (vaginal dryness).  07/31/18  Yes Elby Beck, FNP  Cranberry (AZO CRANBERRY GUMMIES) 500 MG CHEW Chew 2,000 mg by mouth daily.   Yes [provider]  ezetimibe (ZETIA) 10 MG tablet Take 1 tablet (10 mg total) by mouth daily. 08/18/17  Yes Gollan, Kathlene November, MD  Ferrous Gluconate-C-Folic Acid (IRON-C PO) Take 1 tablet by mouth daily.   Yes [provider]  fluticasone (FLONASE) 50 MCG/ACT nasal spray Place 2 sprays into both nostrils daily. Patient taking differently: Place 2 sprays into both nostrils daily as needed for allergies or rhinitis.  04/22/18  Yes Elby Beck, FNP  hydrocortisone 2.5 % cream Apply 1 application topically daily as needed (to itchy areas).  02/04/18  Yes [provider]  levothyroxine (SYNTHROID) 25 MCG tablet Take 1 tablet (25 mcg total) by mouth daily before breakfast. 09/22/18  Yes Deboraha Sprang, MD  magnesium oxide (MAG-OX) 400 MG tablet Take 1 tablet (400 mg total) by mouth daily. 06/05/18  Yes Deboraha Sprang, MD  metolazone (ZAROXOLYN) 5 MG tablet Take 1 tablet (5 mg total) by mouth once a week. Every Thursday Patient taking differently: Take 5 mg by mouth See admin instructions. Take 5 mg by mouth once a day on Tues and Thursday, the week of 02/21/2019- then, as directed 02/23/19  Yes Clegg, Amy D, NP  potassium chloride SA (K-DUR) 20 MEQ tablet Take 2 tablets (40 mEq total) by mouth every morning AND 1 tablet (20 mEq total) at bedtime. Take 1 tablet (20 meq) by mouth twice daily, except on metolazone days take 2 tablets (40 meq) twice daily. Patient taking differently: Take 20 mEq by mouth two times a day and increase to 80 mEq two times  daily on the days Zaroxolyn is taken 11/18/18  Yes Larey Dresser, MD  rosuvastatin (CRESTOR) 40 MG tablet Take 1 tablet (40 mg total) by mouth daily. Patient taking differently: Take 40 mg by mouth at bedtime.  11/11/18  Yes Larey Dresser, MD  spironolactone (ALDACTONE) 25 MG tablet Take 1 tablet (25 mg total) by mouth daily. 11/18/18  Yes Larey Dresser, MD  torsemide (DEMADEX) 20 MG tablet Take 5 tablets (100 mg total) by mouth 2 (two) times daily. 02/03/19  Yes Larey Dresser, MD  vitamin C (ASCORBIC ACID) 250 MG tablet Take 500 mg by mouth daily.   Yes [provider]  warfarin (COUMADIN) 2 MG tablet Take 1 tablet (2 mg total) by mouth as directed. Take 2 mg on Monday- Saturday and 1 Mg on Sunday Patient taking differently: Take 2-3 mg by mouth See admin instructions. Take 2 mg by mouth at bedtime on Sun/Mon/Tues/Thurs/Fri/Sat and 3 mg on Wed 10/12/18  Yes Aline August, MD  bisacodyl (DULCOLAX) 5 MG EC tablet Take 1 tablet (5 mg total) by mouth daily as needed for moderate constipation. Patient not taking: Reported on 02/25/2019 10/12/18   Aline August, MD  docusate sodium (COLACE) 100 MG capsule Take 1 capsule (100 mg total) by mouth 2 (two) times daily. Patient not taking: Reported on 02/25/2019 10/12/18   Aline August, MD    Scheduled Meds:  acetaminophen  500 mg Oral Q6H   aspirin  81 mg Oral Daily   docusate sodium  100 mg Oral BID   ezetimibe  10 mg Oral Daily   fluticasone  2 spray Each Nare Daily   furosemide  80 mg Intravenous Once   furosemide  80 mg Intravenous BID   influenza vaccine adjuvanted  0.5 mL Intramuscular Tomorrow-1000   levothyroxine  25 mcg Oral QAC breakfast   lidocaine  1 patch Transdermal Q24H   loratadine  10 mg Oral Daily   magnesium oxide  400 mg Oral Daily   metolazone  5 mg Oral Q Thu   potassium chloride  20 mEq Oral BID   rosuvastatin  40 mg Oral Daily   sodium chloride flush  3 mL Intravenous Once   spironolactone   25 mg Oral Daily   Infusions:  sodium chloride Stopped (02/25/19 0314)   PRN Meds: bisacodyl, ondansetron **  OR** ondansetron (ZOFRAN) IV   Allergies as of 02/24/2019 - Review Complete 02/24/2019  Allergen Reaction Noted   Dilaudid [hydromorphone] Other (See Comments) 11/06/2018   Fentanyl Other (See Comments) 02/05/2019   Codeine Nausea And Vomiting    Morphine and related Nausea And Vomiting 11/04/2012   Sulfonamide derivatives Other (See Comments)     Family History  Problem Relation Age of Onset   Heart failure Mother    Breast cancer Maternal Aunt 82   Breast cancer Cousin    Breast cancer Other    Colon cancer Neg Hx     Social History   Socioeconomic History   Marital status: Widowed    Spouse name: Not on file   Number of children: Not on file   Years of education: Not on file   Highest education level: Not on file  Occupational History   Occupation: Retired    Fish farm manager: RETIRED  Scientist, product/process development strain: Not on file   Food insecurity    Worry: Not on file    Inability: Not on file   Transportation needs    Medical: Not on file    Non-medical: Not on file  Tobacco Use   Smoking status: Former Smoker    Packs/day: 0.50    Years: 40.00    Pack years: 20.00    Types: Cigarettes    Quit date: 05/06/2001    Years since quitting: 17.8   Smokeless tobacco: Never Used  Substance and Sexual Activity   Alcohol use: No    Alcohol/week: 0.0 standard drinks   Drug use: No   Sexual activity: Never  Lifestyle   Physical activity    Days per week: Not on file    Minutes per session: Not on file   Stress: Not on file  Relationships   Social connections    Talks on phone: Not on file    Gets together: Not on file    Attends religious service: Not on file    Active member of club or organization: Not on file    Attends meetings of clubs or organizations: Not on file    Relationship status: Not on file   Intimate  partner violence    Fear of current or ex partner: Not on file    Emotionally abused: Not on file    Physically abused: Not on file    Forced sexual activity: Not on file  Other Topics Concern   Not on file  Social History Narrative   WIDOW   CHILDREN AND GRANDCHILDREN ALL LIVE CLOSE BY BUT [place], HAS GROUP OF 4 BEST FRIENDS, SELF TITLED "[place]".   ALCOHOL USE-NO   RETIRED   FORMER TOBACCO USE.....1/2 PACK X 40 YRS UNTIL 2003   Would desire CPR    REVIEW OF SYSTEMS: Constitutional: Weakness, fatigue, improved.  Several weeks ago, before she developed the fatigue, she was and remains independent at living alone ENT:  No nose bleeds Pulm: Shortness of breath improved. CV:  No palpitations, no angina.  Weeping edema and ulcers in her lower legs. GU:  No hematuria, no frequency GI: See HPI Heme: Bruises easily but no large hematomas. Transfusions: See HPI. Neuro:  No headaches, no peripheral tingling or numbness.  No syncope. Derm: Open, weeping sores on her legs which are painful..  Endocrine:  No sweats or chills.  No polyuria or dysuria Immunization: Reviewed.  Does not look like  she has had her flu shot this year. Travel:  None beyond local counties in last few months.    PHYSICAL EXAM: Vital signs in last 24 hours: Vitals:   02/26/19 0801 02/26/19 1218  BP: (!) 108/48 (!) 107/55  Pulse: 60 68  Resp: 18   Temp:  98.4 F (36.9 C)  SpO2: 100% 92%   Wt Readings from Last 3 Encounters:  02/26/19 72.4 kg  02/23/19 76.2 kg  02/03/19 74.8 kg    General: Elderly, somewhat ill looking wf who is comfortable, alert. Head: No facial asymmetry or swelling.  No signs of head trauma. Eyes: No conjunctival pallor or scleral icterus. Ears: Not hard of hearing Nose: No congestion or discharge. Mouth: Moist, clear, pink mucosa.  Tongue midline.  Good dental repair, partial denture in place. Neck: No JVD, no masses, no  thyromegaly. Lungs: Clear bilaterally overall reduced breath sounds. Heart: Irregularly irregular, rate controlled.  2/6 systolic murmur.  S1, S2 present.  ICD/pacemaker in position in left lateral sternum Abdomen: Soft.  Nontender.  Nondistended.  No masses, HSM, bruits, hernias.  Active bowel sounds..   Rectal: Deferred. Musc/Skeltl: No obvious joint deformities or swelling. Extremities: Significant stasis dermatitis with weeping and ulcers in both legs.  2+ pitting edema bilaterally in lower legs. Neurologic: Pleasant, cooperative, oriented x3.  No obvious deficits.  No tremors. Skin: Stasis dermatitis as described above. Tattoos: None Nodes: No cervical adenopathy Psych: Cooperative, pleasant, fluid speech  Intake/Output from previous day: 10/22 0701 - 10/23 0700 In: 1370.4 [P.O.:360; Blood:1010.4] Out: 2800 [Urine:2800] Intake/Output this shift: Total I/O In: 240 [P.O.:240] Out: -   LAB RESULTS: Recent Labs    02/24/19 2051 02/25/19 0752 02/26/19 0736  WBC 6.0 5.8 6.0  HGB 6.2* 5.8* 7.5*  HCT 21.5* 19.8* 24.8*  PLT 309 328 226   BMET Lab Results  Component Value Date   NA 135 02/26/2019   NA 138 02/25/2019   NA 135 02/24/2019   K 4.0 02/26/2019   K 3.7 02/25/2019   K 4.2 02/24/2019   CL 93 (L) 02/26/2019   CL 97 (L) 02/25/2019   CL 96 (L) 02/24/2019   CO2 31 02/26/2019   CO2 26 02/25/2019   CO2 26 02/24/2019   GLUCOSE 101 (H) 02/26/2019   GLUCOSE 112 (H) 02/25/2019   GLUCOSE 107 (H) 02/24/2019   BUN 65 (H) 02/26/2019   BUN 67 (H) 02/25/2019   BUN 63 (H) 02/24/2019   CREATININE 1.77 (H) 02/26/2019   CREATININE 1.75 (H) 02/25/2019   CREATININE 1.62 (H) 02/24/2019   CALCIUM 8.8 (L) 02/26/2019   CALCIUM 9.2 02/25/2019   CALCIUM 9.3 02/24/2019   LFT Recent Labs    02/25/19 0752 02/26/19 0736  PROT 6.2* 5.8*  ALBUMIN 3.3* 3.2*  AST 25 30  ALT 13 14  ALKPHOS 55 53  BILITOT 1.1 1.6*   PT/INR Lab Results  Component Value Date   INR 2.5 (H)  02/26/2019   INR 2.4 (H) 02/25/2019   INR 2.5 (H) 02/24/2019   Hepatitis Panel No results for input(s): HEPBSAG, HCVAB, HEPAIGM, HEPBIGM in the last 72 hours. C-Diff No components found for: CDIFF Lipase     Component Value Date/Time   LIPASE 24.0 09/01/2012 1215    Drugs of Abuse  No results found for: LABOPIA, COCAINSCRNUR, LABBENZ, AMPHETMU, THCU, LABBARB   RADIOLOGY STUDIES: Dg Chest 2 View  Result Date: 02/24/2019 CLINICAL DATA:  Worsening shortness of breath 1 week. Recent fall from chair. EXAM: CHEST -  2 VIEW COMPARISON:  11/04/2018 FINDINGS: Stable mild cardiomegaly. AICD remains in appropriate position stable small bilateral pleural effusions versus pleural thickening. No evidence of acute infiltrate or edema. IMPRESSION: Stable mild cardiomegaly and small bilateral pleural effusions versus pleural thickening. No acute findings. Electronically Signed   By: Marlaine Hind M.D.   On: 02/24/2019 20:59   Ct Head Wo Contrast  Result Date: 02/24/2019 CLINICAL DATA:  Head trauma, X ataxia EXAM: CT HEAD WITHOUT CONTRAST TECHNIQUE: Contiguous axial images were obtained from the base of the skull through the vertex without intravenous contrast. COMPARISON:  October 28, 2016 FINDINGS: Brain: No evidence of acute territorial infarction, hemorrhage, hydrocephalus,extra-axial collection or mass lesion/mass effect. There is dilatation the ventricles and sulci consistent with age-related atrophy. Low-attenuation changes in the deep white matter consistent with small vessel ischemia. Vascular: No hyperdense vessel or unexpected calcification. Skull: The skull is intact. No fracture or focal lesion identified. Sinuses/Orbits: The visualized paranasal sinuses and mastoid air cells are clear. The orbits and globes intact. Other: None IMPRESSION: No acute intracranial abnormality. Findings consistent with age related atrophy and chronic small vessel ischemia Electronically Signed   By: Prudencio Pair M.D.    On: 02/24/2019 21:25   Dg Chest Port 1 View  Result Date: 02/25/2019 CLINICAL DATA:  Hypoxia EXAM: PORTABLE CHEST 1 VIEW COMPARISON:  02/24/2019 FINDINGS: LEFT-sided AICD leads overlie the RIGHT atrium and RIGHT ventricle. Heart size is enlarged and stable in configuration. Stable appearance of pulmonary arterial pressure monitor. Small bilateral pleural effusions are stable. No edema or consolidation. IMPRESSION: Stable cardiomegaly and small bilateral pleural effusions. Electronically Signed   By: Nolon Nations M.D.   On: 02/25/2019 21:07   Ct Renal Stone Study  Result Date: 02/25/2019 CLINICAL DATA:  Flank pain. EXAM: CT ABDOMEN AND PELVIS WITHOUT CONTRAST TECHNIQUE: Multidetector CT imaging of the abdomen and pelvis was performed following the standard protocol without IV contrast. COMPARISON:  July 22, 2005. FINDINGS: Lower chest: Small bilateral pleural effusions are noted. Hepatobiliary: No focal liver abnormality is seen. Status post cholecystectomy. No biliary dilatation. Pancreas: Unremarkable. No pancreatic ductal dilatation or surrounding inflammatory changes. Spleen: Normal in size without focal abnormality. Adrenals/Urinary Tract: Status post right nephrectomy. Right adrenal gland is not clearly visualized. Left adrenal gland and kidney are unremarkable. No hydronephrosis or renal obstruction is noted. Urinary bladder is not well visualized due to bilateral scatter artifact arising from bilateral hip prostheses. Stomach/Bowel: The stomach is unremarkable. There is no evidence of bowel obstruction or ileus. The appendix is not visualized. Vascular/Lymphatic: Aortic atherosclerosis. No enlarged abdominal or pelvic lymph nodes. Reproductive: Status post hysterectomy. No adnexal masses. Other: No abdominal wall hernia or abnormality. No abdominopelvic ascites. Musculoskeletal: Old L1 compression fracture is noted. No acute osseous abnormality is noted. IMPRESSION: Small bilateral pleural  effusions are noted. Status post right nephrectomy. No hydronephrosis or renal obstruction is noted. Aortic Atherosclerosis (ICD10-I70.0). Electronically Signed   By: Marijo Conception M.D.   On: 02/25/2019 07:59     IMPRESSION:   *   Acute on chronic anemia.  History of iron deficiency and takes daily iron at home.  *     Congestive heart failure.  *    Hypothyroidism.  TSH normal at 3.9.  *    Diverticulosis on 2011 colonoscopy.    PLAN:     *   EGD?Marland Kitchen  Patient reluctant to undergo repeat colonoscopy.  Dr. Ardis Hughs to determine extent of endoscopic evaluation.   Azucena Freed  02/26/2019,  12:53 PM Phone [phone]

## 2019-02-26 NOTE — TOC Initial Note (Signed)
Transition of Care [facility]) - Initial/Assessment Note    Patient Details  Name: [name] MRN: [id number] Date of Birth: 11-Jul-1933  Transition of [facility]) CM/SW Contact:    Zenon Mayo, RN Phone Number: 02/26/2019, 7:21 PM  Clinical Narrative:                 NCM  Spoke with granddaughter at bedside with patient permission.  She lives home alone, has a cane, granddaughter, Earleen Reaper will transport her home at dc.  Also grandson stays with her on her bad days.  [name] states they go by her house everyday. TOC team will cont to follow for needs.  Expected Discharge Plan: [place] Barriers to Discharge: No Barriers Identified   Patient Goals and CMS Choice Patient states their goals for this hospitalization and ongoing recovery are:: not be so weak and feel better CMS Medicare.gov Compare Post Acute Care list provided to:: Patient Represenative (must comment)(granddaughter)    Expected Discharge Plan and Services Expected Discharge Plan: [place] In-house Referral: NA Discharge Planning Services: CM Consult   Living arrangements for the past 2 months: Single Family Home                 DME Arranged: (NA)         HH Arranged: NA          Prior Living Arrangements/Services Living arrangements for the past 2 months: Single Family Home Lives with:: Self Patient language and need for interpreter reviewed:: Yes Do you feel safe going back to the place where you live?: Yes      Need for Family Participation in Patient Care: Yes (Comment) Care giver support system in place?: Yes (comment) Current home services: (cand) Criminal Activity/Legal Involvement Pertinent to Current Situation/Hospitalization: No - Comment as needed  Activities of Daily Living Home Assistive Devices/Equipment: Walker (specify type) ADL Screening (condition at time of admission) Patient's cognitive ability adequate to safely complete  daily activities?: Yes Is the patient deaf or have difficulty hearing?: Yes Does the patient have difficulty seeing, even when wearing glasses/contacts?: Yes Does the patient have difficulty concentrating, remembering, or making decisions?: No Patient able to express need for assistance with ADLs?: Yes Does the patient have difficulty dressing or bathing?: No Independently performs ADLs?: Yes (appropriate for developmental age) Does the patient have difficulty walking or climbing stairs?: Yes Weakness of Legs: Both Weakness of Arms/Hands: Both  Permission Sought/Granted                  Emotional Assessment       Orientation: : Oriented to Self, Oriented to Place, Oriented to  Time, Oriented to Situation Alcohol / Substance Use: Not Applicable Psych Involvement: No (comment)  Admission diagnosis:  Peripheral edema [R60.9] Anemia, unspecified type [D64.9] Acute CHF (congestive heart failure) ([place]) [I50.9] Patient Active Problem List   Diagnosis Date Noted  . Acute CHF (congestive heart failure) ([place]) 02/25/2019  . Symptomatic anemia 02/25/2019  . Acute on chronic diastolic CHF (congestive heart failure), NYHA class 4 ([place]) 11/04/2018  . Postoperative anemia due to acute blood loss 11/04/2018  . Chronic bilateral pleural effusions 11/04/2018  . Shortness of breath 10/21/2018  . Displaced fracture of left femoral neck ([place]) 10/09/2018  . Somnolence   . Closed left hip fracture ([place]) 10/07/2018  . Fall   . Hypokalemia   . Afib ([place]) 06/01/2018  . Urinary frequency 09/18/2017  . Iliac artery stenosis,  bilateral ([place]) 07/14/2017  . Recurrent UTI 07/14/2017  . Long term (current) use of anticoagulants 05/08/2017  . Anemia 11/18/2016  . Dysuria 11/18/2016  . Pulmonary hypertension ([place]) 12/04/2015  . B12 deficiency 05/22/2015  . PAD (peripheral artery disease) ([place])   . Elevated serum creatinine   . Renal cell carcinoma ([place])   . S/p nephrectomy   . TIA (transient  ischemic attack) 08/16/2014  . Weakness 01/14/2014  . Chronic kidney disease (CKD), stage IV (severe) ([place]) 12/08/2013  . Preoperative cardiovascular examination 06/07/2013  . Chronic diastolic CHF (congestive heart failure) ([place]) 06/04/2013  . Mitral valve regurgitation 06/04/2013  . Allergic rhinitis 01/01/2013  . Chronic cholecystitis with calculus 09/15/2012  . Claudication ([place]) 01/21/2012  . Dizziness 11/26/2011  . Hypertension 05/20/2011  . Avascular necrosis of hip ([place]) 05/03/2011  . OTHER AND UNSPECIFIED COAGULATION DEFECTS 10/13/2009  . RECTAL BLEEDING 10/13/2009  . HOCM / IHSS 07/14/2009  . EDEMA 07/14/2009  . Hyperlipidemia 04/10/2009  . ADJUSTMENT DISORDER WITH ANXIETY 04/10/2009  . INCONTINENCE, URGE 04/10/2009  . Permanent atrial fibrillation 10/27/2008  . SINUS BRADYCARDIA 10/27/2008  . Carotid artery stenosis 10/27/2008  . COPD (chronic obstructive pulmonary disease) ([place]) 10/27/2008  . ICD (implantable cardioverter-defibrillator) in place 10/27/2008   PCP:  Elby Beck, FNP Pharmacy:   [facility], [address] [place] [address] Phone: [phone] Fax: [phone]     Social Determinants of Health (SDOH) Interventions    Readmission Risk Interventions Readmission Risk Prevention Plan 02/26/2019 11/10/2018  Transportation Screening Complete Complete  PCP or Specialist Appt within 3-5 Days - Complete  HRI or [place] - Complete  Social Work Consult for [place] Planning/Counseling - Complete  Palliative Care Screening - Not Applicable  Medication Review Press photographer) Complete Complete  SW Recovery Care/Counseling Consult Complete -  Palliative Care Screening Not Applicable -  [place] Not Applicable -  Some recent data might be hidden

## 2019-02-26 NOTE — Progress Notes (Signed)
Patient has refused wound care because she said her lower extremities are painful and can not tolerated. Patient is educated, will continue to reinforce.

## 2019-02-26 NOTE — Progress Notes (Deleted)
Care taken over from [place].  Pt used bedside comode and suddenly pt HR up to 140-160's, also says non sustaining  VTach and PVc's on monitor. Pt denies chest pain,no lightheadedness, no dizziness.VS stable will monitor

## 2019-02-26 NOTE — Progress Notes (Signed)
Care taken over from [place], started Pt 2nd unit of blood. Will monitor

## 2019-02-27 DIAGNOSIS — I5033 Acute on chronic diastolic (congestive) heart failure: Secondary | ICD-10-CM | POA: Diagnosis not present

## 2019-02-27 DIAGNOSIS — D509 Iron deficiency anemia, unspecified: Secondary | ICD-10-CM

## 2019-02-27 LAB — CBC
HCT: 24.2 % — ABNORMAL LOW (ref 36.0–46.0)
Hemoglobin: 7.2 g/dL — ABNORMAL LOW (ref 12.0–15.0)
MCH: 24.7 pg — ABNORMAL LOW (ref 26.0–34.0)
MCHC: 29.8 g/dL — ABNORMAL LOW (ref 30.0–36.0)
MCV: 82.9 fL (ref 80.0–100.0)
Platelets: 243 10*3/uL (ref 150–400)
RBC: 2.92 MIL/uL — ABNORMAL LOW (ref 3.87–5.11)
RDW: 17.9 % — ABNORMAL HIGH (ref 11.5–15.5)
WBC: 6.4 10*3/uL (ref 4.0–10.5)
nRBC: 0.9 % — ABNORMAL HIGH (ref 0.0–0.2)

## 2019-02-27 LAB — COMPREHENSIVE METABOLIC PANEL
ALT: 15 U/L (ref 0–44)
AST: 28 U/L (ref 15–41)
Albumin: 3.2 g/dL — ABNORMAL LOW (ref 3.5–5.0)
Alkaline Phosphatase: 54 U/L (ref 38–126)
Anion gap: 13 (ref 5–15)
BUN: 69 mg/dL — ABNORMAL HIGH (ref 8–23)
CO2: 30 mmol/L (ref 22–32)
Calcium: 8.5 mg/dL — ABNORMAL LOW (ref 8.9–10.3)
Chloride: 92 mmol/L — ABNORMAL LOW (ref 98–111)
Creatinine, Ser: 1.88 mg/dL — ABNORMAL HIGH (ref 0.44–1.00)
GFR calc Af Amer: 28 mL/min — ABNORMAL LOW (ref >60)
GFR calc non Af Amer: 24 mL/min — ABNORMAL LOW (ref >60)
Glucose, Bld: 106 mg/dL — ABNORMAL HIGH (ref 70–99)
Potassium: 3.6 mmol/L (ref 3.5–5.1)
Sodium: 135 mmol/L (ref 135–145)
Total Bilirubin: 1 mg/dL (ref 0.3–1.2)
Total Protein: 5.9 g/dL — ABNORMAL LOW (ref 6.5–8.1)

## 2019-02-27 LAB — MAGNESIUM: Magnesium: 2.3 mg/dL (ref 1.7–2.4)

## 2019-02-27 LAB — PREPARE RBC (CROSSMATCH)

## 2019-02-27 LAB — PROTIME-INR
INR: 1.7 — ABNORMAL HIGH (ref 0.8–1.2)
Prothrombin Time: 19.3 seconds — ABNORMAL HIGH (ref 11.4–15.2)

## 2019-02-27 MED ORDER — PEG-KCL-NACL-NASULF-NA ASC-C 100 G PO SOLR: 1.0000 | Freq: Two times a day (BID) | ORAL | Status: DC

## 2019-02-27 MED ORDER — SODIUM CHLORIDE 0.9% IV SOLUTION: Freq: Once | INTRAVENOUS | Status: DC

## 2019-02-27 MED ORDER — FUROSEMIDE 10 MG/ML IJ SOLN
80.0000 mg | Freq: Once | INTRAMUSCULAR | Status: AC
  Administered 2019-02-27: 22:00:00 80 mg via INTRAVENOUS

## 2019-02-27 MED ORDER — PEG-KCL-NACL-NASULF-NA ASC-C 100 G PO SOLR
0.5000 | Freq: Once | ORAL | Status: AC
  Administered 2019-02-28: 100 g via ORAL

## 2019-02-27 MED ORDER — PHYTONADIONE 5 MG PO TABS
10.0000 mg | ORAL_TABLET | Freq: Once | ORAL | Status: AC
  Administered 2019-02-27: 10 mg via ORAL
  Filled 2019-02-27: qty 2

## 2019-02-27 MED ORDER — PEG-KCL-NACL-NASULF-NA ASC-C 100 G PO SOLR
0.5000 | Freq: Once | ORAL | Status: AC
  Administered 2019-02-27: 100 g via ORAL
  Filled 2019-02-27: qty 1

## 2019-02-27 NOTE — Progress Notes (Addendum)
Page to GI to notify of patient's decision to do colonoscopy along with EGD tomorrow.  Immediate callback from MD, information/decision shared.  Per MD appropriate changes will be made.

## 2019-02-27 NOTE — Progress Notes (Addendum)
Progress Note   Subjective  Chief Complaint: Normocytic anemia  Today, the patient just had a "spell" before I came into the room.  Apparently she went to sit up on the side of the bed and felt very dizzy and sick to her stomach.  Nursing tells me that she had no changes telemetry wise and vitals look good.  She is now on oxygen via nasal cannula and laying in her bed, patient tells me she feels better now.  Denies any abdominal pain.   Objective   Vital signs in last 24 hours: Temp:  [97.3 F (36.3 C)-98.5 F (36.9 C)] 97.3 F (36.3 C) (10/24 1133) Pulse Rate:  [61-78] 68 (10/24 1133) Resp:  [18] 18 (10/24 1133) BP: (113-123)/(48-62) 123/48 (10/24 1133) SpO2:  [95 %-98 %] 95 % (10/24 1133) Weight:  [73.1 kg] 73.1 kg (10/24 0415) Last BM Date: 02/26/19 General:   Elderly Ill appearing white female in NAD Heart:  Regular rate and rhythm; no murmurs Lungs: Respirations even and unlabored, lungs CTA bilaterally Abdomen:  Soft, nontender and nondistended. Normal bowel sounds. Extremities:  Without edema. Neurologic:  Alert and oriented,  grossly normal neurologically. Psych:  Cooperative. Normal mood and affect.   Lab Results: Recent Labs    02/25/19 0752 02/26/19 0736 02/27/19 0443  WBC 5.8 6.0 6.4  HGB 5.8* 7.5* 7.2*  HCT 19.8* 24.8* 24.2*  PLT 328 226 243   BMET Recent Labs    02/25/19 0752 02/26/19 0736 02/27/19 0443  NA 138 135 135  K 3.7 4.0 3.6  CL 97* 93* 92*  CO2 26 31 30   GLUCOSE 112* 101* 106*  BUN 67* 65* 69*  CREATININE 1.75* 1.77* 1.88*  CALCIUM 9.2 8.8* 8.5*   LFT Recent Labs    02/27/19 0443  PROT 5.9*  ALBUMIN 3.2*  AST 28  ALT 15  ALKPHOS 54  BILITOT 1.0   PT/INR Recent Labs    02/26/19 0736 02/27/19 0443  LABPROT 26.6* 19.3*  INR 2.5* 1.7*    Studies/Results: Dg Chest Port 1 View  Result Date: 02/25/2019 CLINICAL DATA:  Hypoxia EXAM: PORTABLE CHEST 1 VIEW COMPARISON:  02/24/2019 FINDINGS: LEFT-sided AICD leads overlie  the RIGHT atrium and RIGHT ventricle. Heart size is enlarged and stable in configuration. Stable appearance of pulmonary arterial pressure monitor. Small bilateral pleural effusions are stable. No edema or consolidation. IMPRESSION: Stable cardiomegaly and small bilateral pleural effusions. Electronically Signed   By: Nolon Nations M.D.   On: 02/25/2019 21:07     Assessment / Plan:   Assessment: 1.  Acute on chronic anemia: History of iron deficiency on daily iron at home, reluctant to undergo repeat colonoscopy; Consider GI source of blood loss vs other 2.  Congestive heart failure 3.  Diverticulosis on 2011 colonoscopy  Plan: 1.  Answered many questions about EGD today.  Patient agrees to proceed with this tomorrow. 2.  Scheduled patient for EGD with Dr. Ardis Hughs tomorrow.  Did discuss risks, benefits, limitations and alternatives and patient agrees to proceed. 3.  Will give another 10 mg of vitamin K today and repeat INR in the morning 4.  Also need to continue to watch hemoglobin overnight, this must be above 7 in order for procedure tomorrow 5.  Patient can be on heart healthy diet today and n.p.o. after midnight 6.  Please await any further recommendations from Dr. Ardis Hughs later today  Thank you for your kind consultation, we will continue to follow   LOS: 2 days  Lavone Nian Lemmon  02/27/2019, 1:06 PM  ________________________________________________________________________  Velora Heckler GI MD note:  I personally examined the patient, reviewed the data and agree with the assessment and plan described above.  I recommended that she reconsider doing both colonoscopy and EGD tomorrow.  Her daughter in the room agreed that is may be easier on her to get both tests done however she is pretty clear that she only wants to put herself through the stress of prepping, having a colonoscopy if no obvious explanation for her anemia is found on EGD.   Owens Loffler, MD [place]  Gastroenterology Pager [phone]   Addendum: I was told by RN that she decided to go ahead with both the egd and the colonoscoyp. Will arrange prep.

## 2019-02-27 NOTE — H&P (View-Only) (Signed)
Progress Note   Subjective  Chief Complaint: Normocytic anemia  Today, the patient just had a "spell" before I came into the room.  Apparently she went to sit up on the side of the bed and felt very dizzy and sick to her stomach.  Nursing tells me that she had no changes telemetry wise and vitals look good.  She is now on oxygen via nasal cannula and laying in her bed, patient tells me she feels better now.  Denies any abdominal pain.   Objective   Vital signs in last 24 hours: Temp:  [97.3 F (36.3 C)-98.5 F (36.9 C)] 97.3 F (36.3 C) (10/24 1133) Pulse Rate:  [61-78] 68 (10/24 1133) Resp:  [18] 18 (10/24 1133) BP: (113-123)/(48-62) 123/48 (10/24 1133) SpO2:  [95 %-98 %] 95 % (10/24 1133) Weight:  [73.1 kg] 73.1 kg (10/24 0415) Last BM Date: 02/26/19 General:   Elderly Ill appearing white female in NAD Heart:  Regular rate and rhythm; no murmurs Lungs: Respirations even and unlabored, lungs CTA bilaterally Abdomen:  Soft, nontender and nondistended. Normal bowel sounds. Extremities:  Without edema. Neurologic:  Alert and oriented,  grossly normal neurologically. Psych:  Cooperative. Normal mood and affect.   Lab Results: Recent Labs    02/25/19 0752 02/26/19 0736 02/27/19 0443  WBC 5.8 6.0 6.4  HGB 5.8* 7.5* 7.2*  HCT 19.8* 24.8* 24.2*  PLT 328 226 243   BMET Recent Labs    02/25/19 0752 02/26/19 0736 02/27/19 0443  NA 138 135 135  K 3.7 4.0 3.6  CL 97* 93* 92*  CO2 26 31 30   GLUCOSE 112* 101* 106*  BUN 67* 65* 69*  CREATININE 1.75* 1.77* 1.88*  CALCIUM 9.2 8.8* 8.5*   LFT Recent Labs    02/27/19 0443  PROT 5.9*  ALBUMIN 3.2*  AST 28  ALT 15  ALKPHOS 54  BILITOT 1.0   PT/INR Recent Labs    02/26/19 0736 02/27/19 0443  LABPROT 26.6* 19.3*  INR 2.5* 1.7*    Studies/Results: Dg Chest Port 1 View  Result Date: 02/25/2019 CLINICAL DATA:  Hypoxia EXAM: PORTABLE CHEST 1 VIEW COMPARISON:  02/24/2019 FINDINGS: LEFT-sided AICD leads overlie  the RIGHT atrium and RIGHT ventricle. Heart size is enlarged and stable in configuration. Stable appearance of pulmonary arterial pressure monitor. Small bilateral pleural effusions are stable. No edema or consolidation. IMPRESSION: Stable cardiomegaly and small bilateral pleural effusions. Electronically Signed   By: Nolon Nations M.D.   On: 02/25/2019 21:07     Assessment / Plan:   Assessment: 1.  Acute on chronic anemia: History of iron deficiency on daily iron at home, reluctant to undergo repeat colonoscopy; Consider GI source of blood loss vs other 2.  Congestive heart failure 3.  Diverticulosis on 2011 colonoscopy  Plan: 1.  Answered many questions about EGD today.  Patient agrees to proceed with this tomorrow. 2.  Scheduled patient for EGD with Dr. Ardis Hughs tomorrow.  Did discuss risks, benefits, limitations and alternatives and patient agrees to proceed. 3.  Will give another 10 mg of vitamin K today and repeat INR in the morning 4.  Also need to continue to watch hemoglobin overnight, this must be above 7 in order for procedure tomorrow 5.  Patient can be on heart healthy diet today and n.p.o. after midnight 6.  Please await any further recommendations from Dr. Ardis Hughs later today  Thank you for your kind consultation, we will continue to follow   LOS: 2 days  Lavone Nian Lemmon  02/27/2019, 1:06 PM  ________________________________________________________________________  Velora Heckler GI MD note:  I personally examined the patient, reviewed the data and agree with the assessment and plan described above.  I recommended that she reconsider doing both colonoscopy and EGD tomorrow.  Her daughter in the room agreed that is may be easier on her to get both tests done however she is pretty clear that she only wants to put herself through the stress of prepping, having a colonoscopy if no obvious explanation for her anemia is found on EGD.   Owens Loffler, MD [place]  Gastroenterology Pager [phone]   Addendum: I was told by RN that she decided to go ahead with both the egd and the colonoscoyp. Will arrange prep.

## 2019-02-27 NOTE — Progress Notes (Addendum)
PROGRESS NOTE    [name]  XTG:[id number] DOB: 1933/07/08 DOA: 02/24/2019 PCP: Elby Beck, FNP   Brief Narrative:  [name] is [name] 85 y.o. female with history of [name]. fib, diastolic CHF last EF measured was 60 to 35% in [name] 2020, chronic anemia, hypothyroidism, chronic and disease stage III with single functioning kidney presents to the ER because of increasing weakness fall at home and shortness of breath with lower extremity edema.  Patient states over the last 2 weeks her shortness of breath has increased with exertion with no associated chest pain.  Denies any fever chills or productive cough.  Has been feeling increasingly fatigued.  Has been taking iron tablets and noted black stools.  No obvious bleeding otherwise.  Yesterday while sitting on [name] recliner patient dozed off and fell onto the floor hit her head and this made her come to the ER.  ED Course: In the ER CT head is unremarkable chest x-ray shows mild bilateral pleural effusion EKG shows [name]. fib rate controlled.  Labs reveal hemoglobin 6.2 platelet 302 last hemoglobin was around 9.  In September last month.  High-sensitivity troponin was 74 and 70.  BNP was 314.  Patient's creatinine is 1.6.  On exam patient has significant bilateral lower extremity edema.  Patient was ordered 2 units of PRBC transfusion anemia panel and Lasix 80 mg IV and admitted for further management of acute CHF anemia and lower extremity edema.  Assessment & Plan:   Principal Problem:   Acute on chronic diastolic CHF (congestive heart failure), NYHA class 4 (HCC) Active Problems:   Permanent atrial fibrillation   COPD (chronic obstructive pulmonary disease) (HCC)   Hypertension   Chronic kidney disease (CKD), stage IV (severe) (HCC)   PAD (peripheral artery disease) (HCC)   S/p nephrectomy   Pulmonary hypertension (HCC)   Acute CHF (congestive heart failure) (HCC)   Symptomatic anemia   1. Acute on chronic diastolic CHF last EF  measured in [name] 2020 was 60 to 65% 1. Lasix 80 IV BID ordered with spironolactone and metolazone on hospital day 1 (metolazone ordered as home regimen once weekly on Thursday) 2. Hold lasix today (give with blood, but no additional with AKI) 3. I/O, daily weights - was 154 lbs (69.9 kg) at discharge in July (161 today) 4. Pt refused dopplers, but LE swelling seems to be improving 5. Consider cardiology c/s as needed (followed by HF yeam at last admission) 6. S/p cardiomems - was on aspirin for this - will hold this at this time (discussed with Dr. [name])  2. Anemia - With low normal iron, low ferritin, and low % sat, suggestive of iron def anemia.  Normal folate and low normal B12 (follow MMA).  Retics hypoproliferative.   1. 2 units pRBC ordered with lasix - transfuse 1 additional today with LH on way to bathroom, give dose of lasix with this 2. Follow stool hemoccult (pending collection) 3. GI consulted as pt on warfarin for afib with iron def anemia - planning for EGD tomorrow 4. Hold warfarin - INR 1.7 today, follow with vit k per GI 5. Apparently, Dr. [name] was planning on outpatient Heme evaluation as well   3. AKI: suspect related to anemia and overload - baseline appears to be around 1.3-1.4. Follow with diuresis and transfusion. 1.88 today, will continue to monitor.  4. [name]. Fib -rate controlled.  Has AICD/pacemaker.  Hold warfarin given significant iron def anemia above.  5. COPD not actively  wheezing.  6. Chronic and disease stage III with single functioning kidney follow metabolic panel closely.  7. Hyperlipidemia on statins.  8. Hypothyroidism on Synthroid check TSH (wnl)  9. Fall likely from weakness and fatigue from anemia and HF.  Patient still complains of low back pain for which I have ordered CT scan (small bilateral effusions, s/p R nephrectomy, no hydro or obstruction - old L1 compression fx, no acute osseus abnormality). 1. Symptoms improved with lidocaine patch   10. LE wounds: follow wound care recs    DVT prophylaxis: SCD Code Status: dnr Family Communication: none at bedside- discussed with granddaughter 10/22 and 10/23 and 10/24 Disposition Plan: pending further improvement   Consultants:   none  Procedures:   none  Antimicrobials:  Anti-infectives (From admission, onward)   None     Subjective: Felt LH on way to bathroom and when sitting up Discussed additional blood transfusion  Objective: Vitals:   02/26/19 1218 02/26/19 2036 02/27/19 0415 02/27/19 1133  BP: (!) 107/55 117/62 (!) 113/51 (!) 123/48  Pulse: 68 78 61 68  Resp:  18 18 18   Temp: 98.4 F (36.9 C) (!) 97.5 F (36.4 C) 98.5 F (36.9 C) (!) 97.3 F (36.3 C)  TempSrc: Oral Oral Oral Oral  SpO2: 92% 98% 96% 95%  Weight:   73.1 kg   Height:        Intake/Output Summary (Last 24 hours) at 02/27/2019 1429 Last data filed at 02/27/2019 0826 Gross per 24 hour  Intake 959.63 ml  Output 1750 ml  Net -790.37 ml   Filed Weights   02/25/19 0556 02/26/19 0547 02/27/19 0415  Weight: 73.1 kg 72.4 kg 73.1 kg    Examination:  General: No acute distress. Cardiovascular: Heart sounds show [name] regular rate, and rhythm. Lungs: Clear to auscultation bilaterally  Abdomen: Soft, nontender, nondistended Neurological: Alert and oriented 3. Moves all extremities 4 . Cranial nerves II through XII grossly intact. Extremities: bilateral LE edema      Data Reviewed: I have personally reviewed following labs and imaging studies  CBC: Recent Labs  Lab 02/24/19 2051 02/25/19 0752 02/26/19 0736 02/27/19 0443  WBC 6.0 5.8 6.0 6.4  NEUTROABS  --  3.8  --   --   HGB 6.2* 5.8* 7.5* 7.2*  HCT 21.5* 19.8* 24.8* 24.2*  MCV 79.9* 80.2 81.0 82.9  PLT 309 328 226 366   Basic Metabolic Panel: Recent Labs  Lab 02/24/19 2051 02/25/19 0752 02/26/19 0736 02/27/19 0443  NA 135 138 135 135  K 4.2 3.7 4.0 3.6  CL 96* 97* 93* 92*  CO2 26 26 31 30   GLUCOSE 107* 112*  101* 106*  BUN 63* 67* 65* 69*  CREATININE 1.62* 1.75* 1.77* 1.88*  CALCIUM 9.3 9.2 8.8* 8.5*  MG  --   --  2.2 2.3   GFR: Estimated Creatinine Clearance: 23.6 mL/min ([name]) (by C-G formula based on SCr of 1.88 mg/dL (H)). Liver Function Tests: Recent Labs  Lab 02/25/19 0752 02/26/19 0736 02/27/19 0443  AST 25 30 28   ALT 13 14 15   ALKPHOS 55 53 54  BILITOT 1.1 1.6* 1.0  PROT 6.2* 5.8* 5.9*  ALBUMIN 3.3* 3.2* 3.2*   No results for input(s): LIPASE, AMYLASE in the last 168 hours. No results for input(s): AMMONIA in the last 168 hours. Coagulation Profile: Recent Labs  Lab 02/24/19 2051 02/25/19 0408 02/26/19 0736 02/27/19 0443  INR 2.5* 2.4* 2.5* 1.7*   Cardiac Enzymes: No results for input(s): CKTOTAL,  CKMB, CKMBINDEX, TROPONINI in the last 168 hours. BNP (last 3 results) No results for input(s): PROBNP in the last 8760 hours. HbA1C: No results for input(s): HGBA1C in the last 72 hours. CBG: No results for input(s): GLUCAP in the last 168 hours. Lipid Profile: No results for input(s): CHOL, HDL, LDLCALC, TRIG, CHOLHDL, LDLDIRECT in the last 72 hours. Thyroid Function Tests: Recent Labs    02/25/19 0752  TSH 3.968   Anemia Panel: Recent Labs    02/25/19 0002  VITAMINB12 233  FOLATE 17.4  FERRITIN 13  TIBC 421  IRON 28  RETICCTPCT 4.6*   Sepsis Labs: No results for input(s): PROCALCITON, LATICACIDVEN in the last 168 hours.  Recent Results (from the past 240 hour(s))  SARS CORONAVIRUS 2 (TAT 6-24 HRS) Nasopharyngeal Nasopharyngeal Swab     Status: None   Collection Time: 02/25/19 12:26 AM   Specimen: Nasopharyngeal Swab  Result Value Ref Range Status   SARS Coronavirus 2 NEGATIVE NEGATIVE Final    Comment: (NOTE) SARS-CoV-2 target nucleic acids are NOT DETECTED. The SARS-CoV-2 RNA is generally detectable in upper and lower respiratory specimens during the acute phase of infection. Negative results do not preclude SARS-CoV-2 infection, do not rule out  co-infections with other pathogens, and should not be used as the sole basis for treatment or other patient management decisions. Negative results must be combined with clinical observations, patient history, and epidemiological information. The expected result is Negative. Fact Sheet for Patients: SugarRoll.be Fact Sheet for Healthcare Providers: https://www.woods-mathews.com/ This test is not yet approved or cleared by the [place] FDA and  has been authorized for detection and/or diagnosis of SARS-CoV-2 by FDA under an Emergency Use Authorization (EUA). This EUA will remain  in effect (meaning this test can be used) for the duration of the COVID-19 declaration under Section 56 4(b)(1) of the Act, 21 U.S.C. section 360bbb-3(b)(1), unless the authorization is terminated or revoked sooner. Performed at [facility], [place] [address], [place], [place] [address]          Radiology Studies: Dg Chest Port 1 View  Result Date: 02/25/2019 CLINICAL DATA:  Hypoxia EXAM: PORTABLE CHEST 1 VIEW COMPARISON:  02/24/2019 FINDINGS: LEFT-sided AICD leads overlie the RIGHT atrium and RIGHT ventricle. Heart size is enlarged and stable in configuration. Stable appearance of pulmonary arterial pressure monitor. Small bilateral pleural effusions are stable. No edema or consolidation. IMPRESSION: Stable cardiomegaly and small bilateral pleural effusions. Electronically Signed   By: Nolon Nations M.D.   On: 02/25/2019 21:07        Scheduled Meds: . sodium chloride   Intravenous Once  . acetaminophen  500 mg Oral Q6H  . docusate sodium  100 mg Oral BID  . ezetimibe  10 mg Oral Daily  . fluticasone  2 spray Each Nare Daily  . furosemide  80 mg Intravenous Once  . influenza vaccine adjuvanted  0.5 mL Intramuscular Tomorrow-1000  . levothyroxine  25 mcg Oral QAC breakfast  . lidocaine  1 patch Transdermal Q24H  . loratadine  10 mg Oral Daily   . magnesium oxide  400 mg Oral Daily  . metolazone  5 mg Oral Q Thu  . phytonadione  10 mg Oral Once  . potassium chloride  20 mEq Oral BID  . rosuvastatin  40 mg Oral Daily  . sodium chloride flush  3 mL Intravenous Once   Continuous Infusions:    LOS: 2 days    Time spent: over 30 min  Fayrene Helper, MD [facility] Pager AMION  If 7PM-7AM, please contact night-coverage [email or web address] Password [email or web address] 02/27/2019, 2:29 PM

## 2019-02-27 NOTE — Progress Notes (Signed)
Patient resting comfortably during shift report, is on a phone call. Denies complaints.

## 2019-02-27 NOTE — Progress Notes (Signed)
Patient describes feeling unwell during lunch, pt states the symptoms are like her other complaints, but increased in severity during meal.   Pt denies LOC, pt denies need for assistance getting her legs into the bed. Pt was observed putting her legs back into the bed herself. Reported improvement in some symptoms of 'dizziness' 'spinning with motion' once she laid down.   Patients VS WNL during the episode, BP 118/62  O2 95  HR 78    Gastro team entered the room upon pt eval/VS check, were informed of patient's current complaints. Staff left bedside at the time due to emergency with another patient.   2:20 PM Pt's hospitalist was updated on this event after the fact, and denied need for changes to treatment plan at this time. Continue with plan for 1 unit PRBC   2:54 PM Daughter at bedside. Daughter states MD with GI asked pt if she wantes EGD and colonoscopy tomorrow, pt has decided on both. Will attempt to notify GI.

## 2019-02-28 ENCOUNTER — Encounter (HOSPITAL_COMMUNITY): Payer: Self-pay

## 2019-02-28 ENCOUNTER — Encounter (HOSPITAL_COMMUNITY)
Admission: EM | Disposition: A | Discharge status: Home / Self Care | Payer: Self-pay | Source: Home / Self Care | Attending: Internal Medicine

## 2019-02-28 ENCOUNTER — Inpatient Hospital Stay (HOSPITAL_COMMUNITY): Payer: PPO | Admitting: Anesthesiology

## 2019-02-28 DIAGNOSIS — I4821 Permanent atrial fibrillation: Secondary | ICD-10-CM

## 2019-02-28 DIAGNOSIS — D509 Iron deficiency anemia, unspecified: Secondary | ICD-10-CM | POA: Diagnosis not present

## 2019-02-28 DIAGNOSIS — N184 Chronic kidney disease, stage 4 (severe): Secondary | ICD-10-CM

## 2019-02-28 DIAGNOSIS — I10 Essential (primary) hypertension: Secondary | ICD-10-CM | POA: Diagnosis not present

## 2019-02-28 DIAGNOSIS — K529 Noninfective gastroenteritis and colitis, unspecified: Secondary | ICD-10-CM | POA: Diagnosis not present

## 2019-02-28 DIAGNOSIS — I5033 Acute on chronic diastolic (congestive) heart failure: Secondary | ICD-10-CM | POA: Diagnosis not present

## 2019-02-28 DIAGNOSIS — D649 Anemia, unspecified: Secondary | ICD-10-CM

## 2019-02-28 DIAGNOSIS — K2970 Gastritis, unspecified, without bleeding: Secondary | ICD-10-CM | POA: Diagnosis not present

## 2019-02-28 DIAGNOSIS — I272 Pulmonary hypertension, unspecified: Secondary | ICD-10-CM

## 2019-02-28 HISTORY — PX: COLONOSCOPY WITH PROPOFOL: SHX5780

## 2019-02-28 HISTORY — PX: SUBMUCOSAL TATTOO INJECTION: SHX6856

## 2019-02-28 HISTORY — PX: BIOPSY: SHX5522

## 2019-02-28 HISTORY — PX: ESOPHAGOGASTRODUODENOSCOPY (EGD) WITH PROPOFOL: SHX5813

## 2019-02-28 LAB — BPAM RBC
Blood Product Expiration Date: 202011082359
Blood Product Expiration Date: 202011192359
Blood Product Expiration Date: 202011222359
Blood Product Expiration Date: 202011292359
ISSUE DATE / TIME: 202010220948
ISSUE DATE / TIME: 202010230204
ISSUE DATE / TIME: 202010241820
Unit Type and Rh: 600
Unit Type and Rh: 600
Unit Type and Rh: 600
Unit Type and Rh: 9500

## 2019-02-28 LAB — TYPE AND SCREEN
ABO/RH(D): A NEG
Antibody Screen: POSITIVE
DAT, IgG: NEGATIVE
Donor AG Type: NEGATIVE
Donor AG Type: NEGATIVE
Donor AG Type: NEGATIVE
Donor AG Type: NEGATIVE
Unit division: 0
Unit division: 0
Unit division: 0
Unit division: 0

## 2019-02-28 LAB — CBC
HCT: 27.5 % — ABNORMAL LOW (ref 36.0–46.0)
Hemoglobin: 8.3 g/dL — ABNORMAL LOW (ref 12.0–15.0)
MCH: 25.1 pg — ABNORMAL LOW (ref 26.0–34.0)
MCHC: 30.2 g/dL (ref 30.0–36.0)
MCV: 83.1 fL (ref 80.0–100.0)
Platelets: 267 10*3/uL (ref 150–400)
RBC: 3.31 MIL/uL — ABNORMAL LOW (ref 3.87–5.11)
RDW: 17.7 % — ABNORMAL HIGH (ref 11.5–15.5)
WBC: 8.8 10*3/uL (ref 4.0–10.5)
nRBC: 0.3 % — ABNORMAL HIGH (ref 0.0–0.2)

## 2019-02-28 LAB — PROTIME-INR
INR: 1.2 (ref 0.8–1.2)
Prothrombin Time: 15.3 seconds — ABNORMAL HIGH (ref 11.4–15.2)

## 2019-02-28 LAB — COMPREHENSIVE METABOLIC PANEL
ALT: 20 U/L (ref 0–44)
AST: 32 U/L (ref 15–41)
Albumin: 3.4 g/dL — ABNORMAL LOW (ref 3.5–5.0)
Alkaline Phosphatase: 59 U/L (ref 38–126)
Anion gap: 13 (ref 5–15)
BUN: 58 mg/dL — ABNORMAL HIGH (ref 8–23)
CO2: 31 mmol/L (ref 22–32)
Calcium: 8.9 mg/dL (ref 8.9–10.3)
Chloride: 92 mmol/L — ABNORMAL LOW (ref 98–111)
Creatinine, Ser: 1.77 mg/dL — ABNORMAL HIGH (ref 0.44–1.00)
GFR calc Af Amer: 30 mL/min — ABNORMAL LOW (ref >60)
GFR calc non Af Amer: 26 mL/min — ABNORMAL LOW (ref >60)
Glucose, Bld: 104 mg/dL — ABNORMAL HIGH (ref 70–99)
Potassium: 3.4 mmol/L — ABNORMAL LOW (ref 3.5–5.1)
Sodium: 136 mmol/L (ref 135–145)
Total Bilirubin: 1.4 mg/dL — ABNORMAL HIGH (ref 0.3–1.2)
Total Protein: 6.1 g/dL — ABNORMAL LOW (ref 6.5–8.1)

## 2019-02-28 LAB — MAGNESIUM: Magnesium: 2.4 mg/dL (ref 1.7–2.4)

## 2019-02-28 SURGERY — COLONOSCOPY WITH PROPOFOL
Anesthesia: Monitor Anesthesia Care

## 2019-02-28 SURGERY — ESOPHAGOGASTRODUODENOSCOPY (EGD) WITH PROPOFOL
Anesthesia: Monitor Anesthesia Care

## 2019-02-28 MED ORDER — WARFARIN - PHARMACIST DOSING INPATIENT: Freq: Every day | Status: DC

## 2019-02-28 MED ORDER — WARFARIN SODIUM 5 MG PO TABS
5.0000 mg | ORAL_TABLET | Freq: Once | ORAL | Status: AC
  Administered 2019-02-28: 5 mg via ORAL
  Filled 2019-02-28: qty 1

## 2019-02-28 MED ORDER — SODIUM CHLORIDE 0.9 % IV SOLN
INTRAVENOUS | Status: DC | PRN
  Administered 2019-02-28: 20 ug/min via INTRAVENOUS

## 2019-02-28 MED ORDER — ACETAMINOPHEN 500 MG PO TABS
1000.0000 mg | ORAL_TABLET | Freq: Once | ORAL | Status: AC
  Administered 2019-02-28: 1000 mg via ORAL
  Filled 2019-02-28: qty 2

## 2019-02-28 MED ORDER — APIXABAN 2.5 MG PO TABS
2.5000 mg | ORAL_TABLET | Freq: Two times a day (BID) | ORAL | Status: DC
  Administered 2019-02-28 – 2019-03-07 (×14): 2.5 mg via ORAL
  Filled 2019-02-28 (×14): qty 1

## 2019-02-28 MED ORDER — LIDOCAINE 2% (20 MG/ML) 5 ML SYRINGE
INTRAMUSCULAR | Status: DC | PRN
  Administered 2019-02-28: 40 mg via INTRAVENOUS

## 2019-02-28 MED ORDER — SODIUM CHLORIDE 0.9 % IV SOLN
INTRAVENOUS | Status: DC
  Administered 2019-02-28: 07:00:00 via INTRAVENOUS

## 2019-02-28 MED ORDER — SPOT INK MARKER SYRINGE KIT
PACK | SUBMUCOSAL | Status: AC
  Filled 2019-02-28: qty 5

## 2019-02-28 MED ORDER — PROPOFOL 10 MG/ML IV BOLUS
INTRAVENOUS | Status: DC | PRN
  Administered 2019-02-28: 40 mg via INTRAVENOUS
  Administered 2019-02-28: 20 mg via INTRAVENOUS
  Administered 2019-02-28: 30 mg via INTRAVENOUS
  Administered 2019-02-28: 40 mg via INTRAVENOUS

## 2019-02-28 MED ORDER — PROPOFOL 500 MG/50ML IV EMUL
INTRAVENOUS | Status: DC | PRN
  Administered 2019-02-28: 50 ug/kg/min via INTRAVENOUS

## 2019-02-28 MED ORDER — SPOT INK MARKER SYRINGE KIT
PACK | SUBMUCOSAL | Status: DC | PRN
  Administered 2019-02-28: 1 mL via SUBMUCOSAL

## 2019-02-28 MED ORDER — SODIUM CHLORIDE 0.9 % IV SOLN: INTRAVENOUS | Status: DC

## 2019-02-28 SURGICAL SUPPLY — 25 items

## 2019-02-28 NOTE — Anesthesia Preprocedure Evaluation (Addendum)
Anesthesia Evaluation  Patient identified by MRN, date of birth, ID band Patient awake    Reviewed: Allergy & Precautions, NPO status , Patient's Chart, lab work & pertinent test results  History of Anesthesia Complications (+) PONV and history of anesthetic complications  Airway Mallampati: II  TM Distance: >3 FB Neck ROM: Full    Dental  (+) Dental Advisory Given   Pulmonary shortness of breath, COPD, former smoker,    breath sounds clear to auscultation       Cardiovascular hypertension, + CAD, + Peripheral Vascular Disease and +CHF  + Cardiac Defibrillator  Rhythm:Regular     Neuro/Psych PSYCHIATRIC DISORDERS TIA   GI/Hepatic negative GI ROS, Neg liver ROS,   Endo/Other  negative endocrine ROS  Renal/GU CRFRenal disease     Musculoskeletal  (+) Arthritis ,   Abdominal   Peds  Hematology  (+) Blood dyscrasia, anemia ,   Anesthesia Other Findings 1. The left ventricle has normal systolic function with an ejection fraction of 60-65%. The cavity size was normal. Left ventricular diastolic function could not be evaluated due to nondiagnostic images.  2. The right ventricle has normal systolic function. The cavity was mildly enlarged. There is moderately increased right ventricular wall thickness. Right ventricular systolic pressure is severely elevated with an estimated pressure of 93.2 mmHg.  3. Left atrial size was mildly dilated.  4. The mitral valve is degenerative. Mild thickening of the mitral valve leaflet.  5. The tricuspid valve is not well visualized. Tricuspid valve regurgitation is mild-moderate.  6. The aortic valve was not well visualized. Mild thickening of the aortic valve. Aortic valve regurgitation is trivial by color flow Doppler.  7. The inferior vena cava was dilated in size with <50% respiratory variability.  Reproductive/Obstetrics                            Anesthesia  Physical Anesthesia Plan  ASA: III  Anesthesia Plan: MAC   Post-op Pain Management:    Induction: Intravenous  PONV Risk Score and Plan: Treatment may vary due to age or medical condition and Propofol infusion  Airway Management Planned: Nasal Cannula  Additional Equipment: None  Intra-op Plan:   Post-operative Plan:   Informed Consent: I have reviewed the patients History and Physical, chart, labs and discussed the procedure including the risks, benefits and alternatives for the proposed anesthesia with the patient or authorized representative who has indicated his/her understanding and acceptance.     Dental advisory given  Plan Discussed with: CRNA and Surgeon  Anesthesia Plan Comments:         Anesthesia Quick Evaluation

## 2019-02-28 NOTE — Progress Notes (Signed)
PROGRESS NOTE    [name]  MHD:[id number] DOB: [birth year] DOA: 02/24/2019 PCP: Elby Beck, FNP   Brief Narrative:  [name] is a 85 y.o. female with history of A. fib, diastolic CHF last EF measured was 60 to 35% in June 2020, chronic anemia, hypothyroidism, chronic and disease stage III with single functioning kidney presents to the ER because of increasing weakness fall at home and shortness of breath with lower extremity edema.  Patient states over the last 2 weeks her shortness of breath has increased with exertion with no associated chest pain.  Denies any fever chills or productive cough.  Has been feeling increasingly fatigued.  Has been taking iron tablets and noted black stools.  No obvious bleeding otherwise.  Yesterday while sitting on a recliner patient dozed off and fell onto the floor hit her head and this made her come to the ER.  ED Course: In the ER CT head is unremarkable chest x-ray shows mild bilateral pleural effusion EKG shows A. fib rate controlled.  Labs reveal hemoglobin 6.2 platelet 302 last hemoglobin was around 9.  In September last month.  High-sensitivity troponin was 74 and 70.  BNP was 314.  Patient's creatinine is 1.6.  On exam patient has significant bilateral lower extremity edema.  Patient was ordered 2 units of PRBC transfusion anemia panel and Lasix 80 mg IV and admitted for further management of acute CHF anemia and lower extremity edema.  Assessment & Plan:   Principal Problem:   Acute on chronic diastolic CHF (congestive heart failure), NYHA class 4 (HCC) Active Problems:   Permanent atrial fibrillation   COPD (chronic obstructive pulmonary disease) (HCC)   Hypertension   Chronic kidney disease (CKD), stage IV (severe) (HCC)   PAD (peripheral artery disease) (HCC)   S/p nephrectomy   Pulmonary hypertension (HCC)   Acute CHF (congestive heart failure) (HCC)   Symptomatic anemia   1. Acute on chronic diastolic CHF last EF  measured in June 2020 was 60 to 65% 1. Lasix 80 IV BID ordered with spironolactone and metolazone on hospital day 1 (metolazone ordered as home regimen once weekly on Thursday) 2. Hold lasix today and follow, consider resumption of home torsemide tomorrow 3. I/O, daily weights - was 154 lbs (69.9 kg) at discharge in July (158 today) 4. Pt refused dopplers, but LE swelling seems to be improving 5. Consider cardiology c/s as needed (followed by HF yeam at last admission) 6. S/p cardiomems - was on aspirin for this - will hold this at this time (discussed with Dr. [name])  2. Anemia - With low normal iron, low ferritin, and low % sat, suggestive of iron def anemia.  Normal folate and low normal B12 (follow MMA).  Retics hypoproliferative.   1. S/p 3 units pRBC ordered with lasix -  2. Follow stool hemoccult (pending collection) 3. GI consulted as pt on warfarin for afib with iron def anemia - colonoscopy with patchy inflammation in sigmoid - concerning for ischemic changes - biopsies taken, follow.  Recommended up to date cardiology c/s and consider mesenteric vascular w/u.  Also noted mild gastritis which was bx for H pylori. 4. Will discuss with vascular and cardiology - holding off on CTA at this point given renal function.     5. Will restart warfarin and watch for bleeding 6. Apparently, Dr. [name] was planning on outpatient Heme evaluation as well   3. AKI: suspect related to anemia and overload - baseline appears to  be around 1.3-1.4. Follow with diuresis and transfusion. 1.77 today, will continue to monitor off lasix.  4. A. Fib -rate controlled.  Has AICD/pacemaker.  Warfarin restarted today.  5. COPD not actively wheezing.  6. Chronic and disease stage III with single functioning kidney follow metabolic panel closely.  7. Hyperlipidemia on statins.  8. Hypothyroidism on Synthroid check TSH (wnl)  9. Fall likely from weakness and fatigue from anemia and HF.  Patient still complains  of low back pain for which I have ordered CT scan (small bilateral effusions, s/p R nephrectomy, no hydro or obstruction - old L1 compression fx, no acute osseus abnormality). 1. Symptoms improved with lidocaine patch  10. LE wounds: follow wound care recs    DVT prophylaxis: SCD Code Status: dnr Family Communication: none at bedside- discussed with granddaughter 10/22 and 10/23 and 10/24 Disposition Plan: pending further improvement   Consultants:   none  Procedures:  Colonoscopy - Diverticulosis throughout the colon. - Patchy, moderate inflammation in the sigmoid colon. I have most concern for ischemic, possibly chronic. Two sites were biopsied. These findings could possibly explain acute on chronic, heme + anemia. - I have concerns about what appear to be ischemic changes in her left colon causing bleeding, anemia. I recommend up to date cardilogy consultation as she has known CHF, Afib and also consider messenteric vascular workup. - For now OK to resume her blood thinner and observe for overt bleeding, transfuse as needed. - Will proceed with EGD now to exclude other possible sites of anemia.  EGD - Mild gastritis, biopsied to check for H. pylori. - The examination was otherwise normal. Impression: - Return patient to hospital ward for ongoing care. - See colonoscopy report for other recommendations, no further GI testing for now. Please call or page in patient team in any further questions or concers.  Antimicrobials:  Anti-infectives (From admission, onward)   None     Subjective: Feeling a little better today. LH is better, still a little bit  Objective: Vitals:   02/28/19 1051 02/28/19 1059 02/28/19 1102 02/28/19 1112  BP: (!) 112/38 (!) 116/45 (!) 118/35 117/78  Pulse: 74 69  73  Resp: 20 18 16  (!) 24  Temp: (!) 97.3 F (36.3 C)     TempSrc: Temporal     SpO2: 97% 98% 92% 91%  Weight:      Height:        Intake/Output Summary (Last 24 hours) at  02/28/2019 1133 Last data filed at 02/28/2019 1045 Gross per 24 hour  Intake 837.3 ml  Output 2100 ml  Net -1262.7 ml   Filed Weights   02/27/19 0415 02/28/19 0611 02/28/19 0939  Weight: 73.1 kg 71.8 kg 71.7 kg    Examination:  General: No acute distress. Cardiovascular: Heart sounds show a regular rate, and rhythm.  Lungs: Clear to auscultation bilaterally Abdomen: Soft, nontender, nondistended  Neurological: Alert and oriented 3. Moves all extremities 4 . Cranial nerves II through XII grossly intact. Extremities: bilateral LE edema - crusting wounds      Data Reviewed: I have personally reviewed following labs and imaging studies  CBC: Recent Labs  Lab 02/24/19 2051 02/25/19 0752 02/26/19 0736 02/27/19 0443 02/28/19 0730  WBC 6.0 5.8 6.0 6.4 8.8  NEUTROABS  --  3.8  --   --   --   HGB 6.2* 5.8* 7.5* 7.2* 8.3*  HCT 21.5* 19.8* 24.8* 24.2* 27.5*  MCV 79.9* 80.2 81.0 82.9 83.1  PLT 309 328 226  243 086   Basic Metabolic Panel: Recent Labs  Lab 02/24/19 2051 02/25/19 0752 02/26/19 0736 02/27/19 0443 02/28/19 0730  NA 135 138 135 135 136  K 4.2 3.7 4.0 3.6 3.4*  CL 96* 97* 93* 92* 92*  CO2 26 26 31 30 31   GLUCOSE 107* 112* 101* 106* 104*  BUN 63* 67* 65* 69* 58*  CREATININE 1.62* 1.75* 1.77* 1.88* 1.77*  CALCIUM 9.3 9.2 8.8* 8.5* 8.9  MG  --   --  2.2 2.3 2.4   GFR: Estimated Creatinine Clearance: 24.9 mL/min (A) (by C-G formula based on SCr of 1.77 mg/dL (H)). Liver Function Tests: Recent Labs  Lab 02/25/19 0752 02/26/19 0736 02/27/19 0443 02/28/19 0730  AST 25 30 28  32  ALT 13 14 15 20   ALKPHOS 55 53 54 59  BILITOT 1.1 1.6* 1.0 1.4*  PROT 6.2* 5.8* 5.9* 6.1*  ALBUMIN 3.3* 3.2* 3.2* 3.4*   No results for input(s): LIPASE, AMYLASE in the last 168 hours. No results for input(s): AMMONIA in the last 168 hours. Coagulation Profile: Recent Labs  Lab 02/24/19 2051 02/25/19 0408 02/26/19 0736 02/27/19 0443 02/28/19 0730  INR 2.5* 2.4* 2.5*  1.7* 1.2   Cardiac Enzymes: No results for input(s): CKTOTAL, CKMB, CKMBINDEX, TROPONINI in the last 168 hours. BNP (last 3 results) No results for input(s): PROBNP in the last 8760 hours. HbA1C: No results for input(s): HGBA1C in the last 72 hours. CBG: No results for input(s): GLUCAP in the last 168 hours. Lipid Profile: No results for input(s): CHOL, HDL, LDLCALC, TRIG, CHOLHDL, LDLDIRECT in the last 72 hours. Thyroid Function Tests: No results for input(s): TSH, T4TOTAL, FREET4, T3FREE, THYROIDAB in the last 72 hours. Anemia Panel: No results for input(s): VITAMINB12, FOLATE, FERRITIN, TIBC, IRON, RETICCTPCT in the last 72 hours. Sepsis Labs: No results for input(s): PROCALCITON, LATICACIDVEN in the last 168 hours.  Recent Results (from the past 240 hour(s))  SARS CORONAVIRUS 2 (TAT 6-24 HRS) Nasopharyngeal Nasopharyngeal Swab     Status: None   Collection Time: 02/25/19 12:26 AM   Specimen: Nasopharyngeal Swab  Result Value Ref Range Status   SARS Coronavirus 2 NEGATIVE NEGATIVE Final    Comment: (NOTE) SARS-CoV-2 target nucleic acids are NOT DETECTED. The SARS-CoV-2 RNA is generally detectable in upper and lower respiratory specimens during the acute phase of infection. Negative results do not preclude SARS-CoV-2 infection, do not rule out co-infections with other pathogens, and should not be used as the sole basis for treatment or other patient management decisions. Negative results must be combined with clinical observations, patient history, and epidemiological information. The expected result is Negative. Fact Sheet for Patients: SugarRoll.be Fact Sheet for Healthcare Providers: https://www.woods-mathews.com/ This test is not yet approved or cleared by the [place] FDA and  has been authorized for detection and/or diagnosis of SARS-CoV-2 by FDA under an Emergency Use Authorization (EUA). This EUA will remain  in effect  (meaning this test can be used) for the duration of the COVID-19 declaration under Section 56 4(b)(1) of the Act, 21 U.S.C. section 360bbb-3(b)(1), unless the authorization is terminated or revoked sooner. Performed at [facility], [place] [address] [place], [place] [address]          Radiology Studies: No results found.      Scheduled Meds: . sodium chloride   Intravenous Once  . acetaminophen  500 mg Oral Q6H  . docusate sodium  100 mg Oral BID  . ezetimibe  10 mg Oral Daily  .  fluticasone  2 spray Each Nare Daily  . influenza vaccine adjuvanted  0.5 mL Intramuscular Tomorrow-1000  . levothyroxine  25 mcg Oral QAC breakfast  . lidocaine  1 patch Transdermal Q24H  . loratadine  10 mg Oral Daily  . magnesium oxide  400 mg Oral Daily  . potassium chloride  20 mEq Oral BID  . rosuvastatin  40 mg Oral Daily  . sodium chloride flush  3 mL Intravenous Once   Continuous Infusions:    LOS: 3 days    Time spent: over 30 min    Fayrene Helper, MD [facility] Pager AMION  If 7PM-7AM, please contact night-coverage [email or web address] Password [facility] 02/28/2019, 11:33 AM

## 2019-02-28 NOTE — Op Note (Signed)
[facility] Patient Name: [name] Procedure Date : 02/28/2019 MRN: [id number] Attending MD: Milus Banister , MD Date of Birth: July 31, 1933 CSN: [id number] Age: 85 Admit Type: Inpatient Procedure:                Upper GI endoscopy Indications:              Iron deficiency anemia Providers:                Milus Banister, MD, Glori Bickers, RN, Laverda Sorenson, Technician, Luane School, CRNA Referring MD:              Medicines:                Monitored Anesthesia Care Complications:            No immediate complications. Estimated blood loss:                            None. Estimated Blood Loss:     Estimated blood loss: none. Procedure:                Pre-Anesthesia Assessment:                           - Prior to the procedure, a History and Physical                            was performed, and patient medications and                            allergies were reviewed. The patient's tolerance of                            previous anesthesia was also reviewed. The risks                            and benefits of the procedure and the sedation                            options and risks were discussed with the patient.                            All questions were answered, and informed consent                            was obtained. Prior Anticoagulants: The patient has                            taken Coumadin (warfarin), last dose was 3 days                            prior to procedure. ASA Grade Assessment: IV - A  patient with severe systemic disease that is a                            constant threat to life. After reviewing the risks                            and benefits, the patient was deemed in                            satisfactory condition to undergo the procedure.                           After obtaining informed consent, the endoscope was                            passed under direct  vision. Throughout the                            procedure, the patient's blood pressure, pulse, and                            oxygen saturations were monitored continuously. The                            [id number]-H190 ([id number]) Olympus gastroscope was                            introduced through the mouth, and advanced to the                            second part of duodenum. The upper GI endoscopy was                            accomplished without difficulty. The patient                            tolerated the procedure well. Scope In: Scope Out: Findings:      Mild inflammation characterized by erosions and erythema was found in       the gastric antrum. Biopsies were taken with a cold forceps for       histology.      The exam was otherwise without abnormality. Impression:               - Mild gastritis, biopsied to check for H. pylori.                           - The examination was otherwise normal. Recommendation:           - Return patient to hospital ward for ongoing care.                           - See colonoscopy report for other recommendations,  no further GI testing for now. Please call or page                            in patient team in any further questions or concers. Procedure Code(s):        --- Professional ---                           [phone], Esophagogastroduodenoscopy, flexible,                            transoral; with biopsy, single or multiple Diagnosis Code(s):        --- Professional ---                           K29.70, Gastritis, unspecified, without bleeding                           D50.9, Iron deficiency anemia, unspecified CPT copyright 2019 American Medical Association. All rights reserved. The codes documented in this report are preliminary and upon coder review may  be revised to meet current compliance requirements. Milus Banister, MD 02/28/2019 10:57:27 AM This report has been signed electronically. Number of  Addenda: 0

## 2019-02-28 NOTE — Anesthesia Procedure Notes (Signed)
Procedure Name: MAC Date/Time: 02/28/2019 10:19 AM Performed by: Renato Shin, CRNA Pre-anesthesia Checklist: Patient identified, Emergency Drugs available, Suction available and Patient being monitored Patient Re-evaluated:Patient Re-evaluated prior to induction Oxygen Delivery Method: Nasal cannula Preoxygenation: Pre-oxygenation with 100% oxygen Induction Type: IV induction Placement Confirmation: positive ETCO2 and breath sounds checked- equal and bilateral Dental Injury: Teeth and Oropharynx as per pre-operative assessment

## 2019-02-28 NOTE — Progress Notes (Signed)
ANTICOAGULATION CONSULT NOTE - Initial Consult  Pharmacy Consult for Coumadin Indication: atrial fibrillation  Allergies  Allergen Reactions  . Dilaudid [Hydromorphone] Other (See Comments)    Excessive Somnolence- Required Narcan  . Fentanyl Other (See Comments)    Required Narcan when given with Dilaudid. Tolerated single dose when given during procedure 02/03/2019.   Marland Kitchen Codeine Nausea And Vomiting    Hallucinations   . Morphine And Related Nausea And Vomiting    Hallucinations   . Sulfonamide Derivatives Other (See Comments)    Dry mouth    Patient Measurements: Height: 5\' 6"  (167.6 cm) Weight: 158 lb (71.7 kg) IBW/kg (Calculated) : 59.3 Heparin Dosing Weight: 71.7 kg  Vital Signs: Temp: 97.4 F (36.3 C) (10/25 1144) Temp Source: Oral (10/25 1144) BP: 112/57 (10/25 1144) Pulse Rate: 72 (10/25 1144)  Labs: Recent Labs    02/26/19 0736 02/27/19 0443 02/28/19 0730  HGB 7.5* 7.2* 8.3*  HCT 24.8* 24.2* 27.5*  PLT 226 243 267  LABPROT 26.6* 19.3* 15.3*  INR 2.5* 1.7* 1.2  CREATININE 1.77* 1.88* 1.77*    Estimated Creatinine Clearance: 24.9 mL/min (A) (by C-G formula based on SCr of 1.77 mg/dL (H)).   Medical History: Past Medical History:  Diagnosis Date  . Adjustment disorder with anxiety   . Anginal pain ([place])   . Arthritis    "some in my hands" (11/11/2012)  . Atrial fibrillation ([place])   . Automatic implantable cardioverter-defibrillator in situ   . Blood in stool   . Carotid artery stenosis 09/2007   a. 09/2007: 60-79% bilateral (stable); b. 10/2008: 40-59% R 60-79%   . Chest pain, unspecified   . Chronic airway obstruction, not elsewhere classified   . Chronic bronchitis ([place])    "get it some; not q year" (11/11/2012)  . Chronic diastolic CHF (congestive heart failure) ([place]) 06/04/2013  . Chronic kidney disease, unspecified   . Coronary artery disease    non-obstructive by 2006 cath  . Frequent UTI    "get them a couple times/yr" (11/11/2012)  .  Hemorrhage of rectum and anus   . High cholesterol   . History of blood transfusion 04/2011   "after hip OR" (11/11/2012)  . Hypertension 05/20/2011  . Hypertr obst cardiomyop   . Hypotension, unspecified    cardiac cath 2006..nonobstructive CAD 30-40s lesions.Marland KitchenETT 1/09 nondiagnostic due to poor HR response..Right Renal Cancer 2003  . Long term (current) use of anticoagulants   . Malignant neoplasm of kidney, except pelvis   . Osteoarthritis of right hip   . Other and unspecified coagulation defects   . PONV (postoperative nausea and vomiting)   . Renal cancer ([place]) 06/2001   Right  . Secondary cardiomyopathy, unspecified   . Sinus bradycardia   . Urge incontinence    Assessment: 85 yo female on chronic Coumadin PTA admitted with concern for GIB and Coumadin was held.  Pharmacy asked to resume Coumadin tonight since patient is now s/p EGD and colonoscopy.  Received 10 mg of Vitamin K on 10/23, and again 10/24.  PTA Coumadin dose 2 mg daily except 3 mg on Wed.  Goal of Therapy:  INR 2-3 Monitor platelets by anticoagulation protocol: Yes   Plan:  1. Coumadin 5 mg x 1 tonight, may take some time to overcome effects of vitamin K doses. 2. Daily INR.  Marguerite Olea, [facility] Clinical Pharmacist Phone [phone]  02/28/2019 1:07 PM

## 2019-02-28 NOTE — Anesthesia Postprocedure Evaluation (Signed)
Anesthesia Post Note  Patient: [name]  Procedure(s) Performed: ESOPHAGOGASTRODUODENOSCOPY (EGD) WITH PROPOFOL (N/A ) COLONOSCOPY WITH PROPOFOL (N/A ) SUBMUCOSAL TATTOO INJECTION BIOPSY     Patient location during evaluation: Endoscopy Anesthesia Type: MAC Level of consciousness: awake and alert Pain management: pain level controlled Vital Signs Assessment: post-procedure vital signs reviewed and stable Respiratory status: spontaneous breathing, nonlabored ventilation, respiratory function stable and patient connected to nasal cannula oxygen Cardiovascular status: stable and blood pressure returned to baseline Postop Assessment: no apparent nausea or vomiting Anesthetic complications: no    Last Vitals:  Vitals:   02/28/19 1112 02/28/19 1144  BP: 117/78 (!) 112/57  Pulse: 73 72  Resp: (!) 24 16  Temp:  (!) 36.3 C  SpO2: 91% 94%    Last Pain:  Vitals:   02/28/19 1144  TempSrc: Oral  PainSc:                  [name]

## 2019-02-28 NOTE — Progress Notes (Signed)
Patient is awake during shift report, reports a rough time prepping for Endo today.   Patient denies current complaints.  CBC pending, no H&H completed post-transfusion overnight.

## 2019-02-28 NOTE — Consult Note (Signed)
Admit date: 02/24/2019 Referring Physician  Alben Deeds, MD Primary Physician  Dr. Alcide Clever, FNP Primary Cardiologist  Loralie Champagne, MD Reason for Consultation  Guidance on anticoagulation  HPI: CALYSTA CRAIGO is a 85 y.o. female who is being seen today for the evaluation of guidance with anticoagulation at the request of Dr. Florene Glen.  This is an 85yo female with a hx of permanent afib on chronic anticoagulation with Coumadin, chronic diastolic CHF, CKD, nonobstructive CAD, HTN.  She was admitted with increasing weakness, SOB, LE edema and fall at home.  She gives a 2 week hx of worsening DOE but no CP.  She has been feeling very fatigued as well.  She apparently was sitting in her recliner yesterday and fell asleep and fell onto the floor hitting her head.  In ER Cxray showed mild pleural effusions, EKG with rate controlled afib and severe anemia with Hbg 6.2 (last Hbg 9).  hsTrop elevated at 74 and 70 and BNP was elevated at 314.  She was given 3 units PRBCs and Lasix 80mg  IV.  She was started on IV lasix 80mg  BID and home dose of spiro and Metolazone which were stopped today with plans to resume home dose of torsemide tomorrow.  Her discharge weight in July was 158lbs.   Colonoscopy showed ischemic changes with possible mesenteric ischemia and mild gastritis.  Due to  CKD CT was not pursued.  Her baseline creatinine is around 1.3-1.3 and 1.77 today.  Lasix now held. Her granddaughter is in the room and tells me that her INRs have not been therapeutic recently and she has been off and on antibiotics.  INR was 1.9 on 9/24, 1.2 on 9/30 (had cath this day) and 1.7 on 10/5.  She is followed by AHF service for HOCM and chronic diastolic CHF s/p remote AICD and had a cath on 9/30 showing elevated right and left heart filling pressures with moderate pulmonary venous HTN.  Her Torsemide was increased to 100mg  BID and was started on ASA 81mg  daily and restarted on warfarin.    PMH:    Past Medical History:  Diagnosis Date  . Adjustment disorder with anxiety   . Anginal pain ([place])   . Arthritis    "some in my hands" (11/11/2012)  . Atrial fibrillation ([place])   . Automatic implantable cardioverter-defibrillator in situ   . Blood in stool   . Carotid artery stenosis 09/2007   a. 09/2007: 60-79% bilateral (stable); b. 10/2008: 40-59% R 60-79%   . Chest pain, unspecified   . Chronic airway obstruction, not elsewhere classified   . Chronic bronchitis ([place])    "get it some; not q year" (11/11/2012)  . Chronic diastolic CHF (congestive heart failure) ([place]) 06/04/2013  . Chronic kidney disease, unspecified   . Coronary artery disease    non-obstructive by 2006 cath  . Frequent UTI    "get them a couple times/yr" (11/11/2012)  . Hemorrhage of rectum and anus   . High cholesterol   . History of blood transfusion 04/2011   "after hip OR" (11/11/2012)  . Hypertension 05/20/2011  . Hypertr obst cardiomyop   . Hypotension, unspecified    cardiac cath 2006..nonobstructive CAD 30-40s lesions.Marland KitchenETT 1/09 nondiagnostic due to poor HR response..Right Renal Cancer 2003  . Long term (current) use of anticoagulants   . Malignant neoplasm of kidney, except pelvis   . Osteoarthritis of right hip   . Other and unspecified coagulation defects   . PONV (postoperative nausea  and vomiting)   . Renal cancer ([place]) 06/2001   Right  . Secondary cardiomyopathy, unspecified   . Sinus bradycardia   . Urge incontinence      PSH:   Past Surgical History:  Procedure Laterality Date  . ABDOMINAL HYSTERECTOMY  1975   for benign causes  . APPENDECTOMY    . BI-VENTRICULAR PACEMAKER UPGRADE  05/04/2010  . CARDIAC CATHETERIZATION  2006  . CARDIOVERSION N/A 02/20/2018   Procedure: CARDIOVERSION;  Surgeon: Minna Merritts, MD;  Location: [facility];  Service: Cardiovascular;  Laterality: N/A;  . CARDIOVERSION N/A 03/27/2018   Procedure: CARDIOVERSION;  Surgeon: Minna Merritts, MD;  Location: [facility];   Service: Cardiovascular;  Laterality: N/A;  . CATARACT EXTRACTION W/ INTRAOCULAR LENS  IMPLANT, BILATERAL  01/2006-02-2006  . CHOLECYSTECTOMY N/A 11/11/2012   Procedure: LAPAROSCOPIC CHOLECYSTECTOMY WITH INTRAOPERATIVE CHOLANGIOGRAM;  Surgeon: Imogene Burn. Georgette Dover, MD;  Location: [facility];  Service: General;  Laterality: N/A;  . EP IMPLANTABLE DEVICE N/A 02/21/2016   Procedure: ICD Generator Changeout;  Surgeon: Deboraha Sprang, MD;  Location: [facility];  Service: Cardiovascular;  Laterality: N/A;  . INSERT / REPLACE / REMOVE PACEMAKER  05-01-11   02-28-05-/05-04-10-ICD-MEDTRONIC MAXIMAL DR  . JOINT REPLACEMENT    . LAPAROSCOPIC CHOLECYSTECTOMY  11/11/2012  . LAPAROSCOPIC LYSIS OF ADHESIONS N/A 11/11/2012   Procedure: LAPAROSCOPIC LYSIS OF ADHESIONS;  Surgeon: Imogene Burn. Georgette Dover, MD;  Location: [facility];  Service: General;  Laterality: N/A;  . NEPHRECTOMY Right 06/2001    S/P RENAL CELL CANCER  . PRESSURE SENSOR/CARDIOMEMS N/A 02/03/2019   Procedure: PRESSURE SENSOR/CARDIOMEMS;  Surgeon: Larey Dresser, MD;  Location: [facility];  Service: Cardiovascular;  Laterality: N/A;  . RIGHT HEART CATH N/A 11/09/2018   Procedure: RIGHT HEART CATH;  Surgeon: Larey Dresser, MD;  Location: [facility];  Service: Cardiovascular;  Laterality: N/A;  . TOTAL HIP ARTHROPLASTY Right 05/03/2011   Procedure: TOTAL HIP ARTHROPLASTY ANTERIOR APPROACH;  Surgeon: Mcarthur Rossetti;  Location: [facility];  Service: Orthopedics;  Laterality: Right;  Removal of Cannulated Screws Right Hip, Right Direct Anterior Hip Replacement  . TOTAL HIP ARTHROPLASTY Left 10/09/2018   Procedure: TOTAL HIP ARTHROPLASTY ANTERIOR APPROACH;  Surgeon: Rod Can, MD;  Location: [facility];  Service: Orthopedics;  Laterality: Left;    Allergies:  Dilaudid [hydromorphone], Fentanyl, Codeine, Morphine and related, and Sulfonamide derivatives Prior to Admit Meds:   Medications Prior to Admission  Medication Sig Dispense Refill Last Dose   . acetaminophen (TYLENOL) 500 MG tablet Take 500-1,000 mg by mouth every 8 (eight) hours as needed for mild pain or headache.    Past Week at Unknown time  . aspirin (ASPIRIN CHILDRENS) 81 MG chewable tablet Chew 1 tablet (81 mg total) by mouth daily. 36 tablet 11 02/24/2019 at 0800  . cetirizine (ZYRTEC) 10 MG tablet Take 10 mg by mouth daily.    02/24/2019 at am  . conjugated estrogens (PREMARIN) vaginal cream Place 1 Applicatorful vaginally daily. (Patient taking differently: Place 1 Applicatorful vaginally daily as needed (vaginal dryness). ) 42.5 g 12 unk at unk  . Cranberry (AZO CRANBERRY GUMMIES) 500 MG CHEW Chew 2,000 mg by mouth daily.   02/24/2019 at am  . ezetimibe (ZETIA) 10 MG tablet Take 1 tablet (10 mg total) by mouth daily. 30 tablet 11 02/24/2019 at am  . Ferrous Gluconate-C-Folic Acid (IRON-C PO) Take 1 tablet by mouth daily.   02/24/2019 at Unknown time  . fluticasone (FLONASE) 50  MCG/ACT nasal spray Place 2 sprays into both nostrils daily. (Patient taking differently: Place 2 sprays into both nostrils daily as needed for allergies or rhinitis. ) 16 g 6 Past Week at Unknown time  . hydrocortisone 2.5 % cream Apply 1 application topically daily as needed (to itchy areas).    unk at unk  . levothyroxine (SYNTHROID) 25 MCG tablet Take 1 tablet (25 mcg total) by mouth daily before breakfast. 30 tablet 3 02/24/2019 at am  . magnesium oxide (MAG-OX) 400 MG tablet Take 1 tablet (400 mg total) by mouth daily. 30 tablet 2 02/24/2019 at am  . metolazone (ZAROXOLYN) 5 MG tablet Take 1 tablet (5 mg total) by mouth once a week. Every Thursday (Patient taking differently: Take 5 mg by mouth See admin instructions. Take 5 mg by mouth once a day on Tues and Thursday, the week of 02/21/2019- then, as directed) 13 tablet 3 02/23/2019 at Unknown time  . potassium chloride SA (K-DUR) 20 MEQ tablet Take 2 tablets (40 mEq total) by mouth every morning AND 1 tablet (20 mEq total) at bedtime. Take 1 tablet  (20 meq) by mouth twice daily, except on metolazone days take 2 tablets (40 meq) twice daily. (Patient taking differently: Take 20 mEq by mouth two times a day and increase to 80 mEq two times daily on the days Zaroxolyn is taken) 270 tablet 3 02/24/2019 at am  . rosuvastatin (CRESTOR) 40 MG tablet Take 1 tablet (40 mg total) by mouth daily. (Patient taking differently: Take 40 mg by mouth at bedtime. ) 90 tablet 3 02/23/2019 at pm  . spironolactone (ALDACTONE) 25 MG tablet Take 1 tablet (25 mg total) by mouth daily. 90 tablet 3 02/24/2019 at Unknown time  . torsemide (DEMADEX) 20 MG tablet Take 5 tablets (100 mg total) by mouth 2 (two) times daily. 540 tablet 3 02/24/2019 at am  . vitamin C (ASCORBIC ACID) 250 MG tablet Take 500 mg by mouth daily.   02/24/2019 at am  . warfarin (COUMADIN) 2 MG tablet Take 1 tablet (2 mg total) by mouth as directed. Take 2 mg on Monday- Saturday and 1 Mg on Sunday (Patient taking differently: Take 2-3 mg by mouth See admin instructions. Take 2 mg by mouth at bedtime on Sun/Mon/Tues/Thurs/Fri/Sat and 3 mg on Wed)   02/23/2019 at 2000  . bisacodyl (DULCOLAX) 5 MG EC tablet Take 1 tablet (5 mg total) by mouth daily as needed for moderate constipation. (Patient not taking: Reported on 02/25/2019) 10 tablet 0 Not Taking at Unknown time  . docusate sodium (COLACE) 100 MG capsule Take 1 capsule (100 mg total) by mouth 2 (two) times daily. (Patient not taking: Reported on 02/25/2019) 10 capsule 0 Not Taking at Unknown time   Fam HX:    Family History  Problem Relation Age of Onset  . Heart failure Mother   . Breast cancer Maternal Aunt 82  . Breast cancer Cousin   . Breast cancer Other   . Colon cancer Neg Hx    Social HX:    Social History   Socioeconomic History  . Marital status: Widowed    Spouse name: Not on file  . Number of children: Not on file  . Years of education: Not on file  . Highest education level: Not on file  Occupational History  . Occupation:  Retired    Fish farm manager: RETIRED  Social Needs  . Financial resource strain: Not on file  . Food insecurity    Worry: Not  on file    Inability: Not on file  . Transportation needs    Medical: Not on file    Non-medical: Not on file  Tobacco Use  . Smoking status: Former Smoker    Packs/day: 0.50    Years: 40.00    Pack years: 20.00    Types: Cigarettes    Quit date: 05/06/2001    Years since quitting: 17.8  . Smokeless tobacco: Never Used  Substance and Sexual Activity  . Alcohol use: No    Alcohol/week: 0.0 standard drinks  . Drug use: No  . Sexual activity: Never  Lifestyle  . Physical activity    Days per week: Not on file    Minutes per session: Not on file  . Stress: Not on file  Relationships  . Social Herbalist on phone: Not on file    Gets together: Not on file    Attends religious service: Not on file    Active member of club or organization: Not on file    Attends meetings of clubs or organizations: Not on file    Relationship status: Not on file  . Intimate partner violence    Fear of current or ex partner: Not on file    Emotionally abused: Not on file    Physically abused: Not on file    Forced sexual activity: Not on file  Other Topics Concern  . 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   ALCOHOL USE-NO   RETIRED   FORMER TOBACCO USE.....1/2 PACK X 19 YRS UNTIL 2003   Would desire CPR     ROS:  All  ROS were addressed and are negative except what is stated in the HPI  Physical Exam: Blood pressure (!) 112/57, pulse 72, temperature (!) 97.4 F (36.3 C), temperature source Oral, resp. rate 16, height 5\' 6"  (1.676 m), weight 71.7 kg, SpO2 94 %.    General: Well developed, well nourished, in no acute distress Head: Eyes PERRLA, No xanthomas.   Normal cephalic and atramatic  Lungs:    Clear bilaterally to auscultation and percussion. Heart:   Irregularly irregular S1 S2 Pulses are 2+ & equal.            No carotid bruit. No JVD.  No abdominal bruits. No femoral bruits. Abdomen: Bowel sounds are positive, firm but nontender Msk:  Back normal, normal gait. Normal strength and tone for age. Extremities:   No clubbing, cyanosis.  2+ LE  edema.  DP +1 Neuro: Alert and oriented X 3. Psych:  Good affect, responds appropriately    Labs:   Lab Results  Component Value Date   WBC 8.8 02/28/2019   HGB 8.3 (L) 02/28/2019   HCT 27.5 (L) 02/28/2019   MCV 83.1 02/28/2019   PLT 267 02/28/2019    Recent Labs  Lab 02/28/19 0730  NA 136  K 3.4*  CL 92*  CO2 31  BUN 58*  CREATININE 1.77*  CALCIUM 8.9  PROT 6.1*  BILITOT 1.4*  ALKPHOS 59  ALT 20  AST 32  GLUCOSE 104*   No results found for: PTT Lab Results  Component Value Date   INR 1.2 02/28/2019   INR 1.7 (H) 02/27/2019   INR 2.5 (H) 02/26/2019   Lab Results  Component  Value Date   CKTOTAL 114 02/10/2012   CKMB 1.1 06/08/2010   TROPONINI 0.03 (HH) 05/12/2018     Lab Results  Component Value Date   CHOL 179 03/19/2016   CHOL 203 (H) 03/06/2015   CHOL 160 08/17/2014   Lab Results  Component Value Date   HDL 39.10 03/19/2016   HDL 39.20 03/06/2015   HDL 31 (L) 08/17/2014   Lab Results  Component Value Date   LDLCALC 118 (H) 03/19/2016   LDLCALC 97 08/17/2014   LDLCALC 183 (H) 06/09/2014   Lab Results  Component Value Date   TRIG 110.0 03/19/2016   TRIG 256.0 (H) 03/06/2015   TRIG 158 (H) 08/17/2014   Lab Results  Component Value Date   CHOLHDL 5 03/19/2016   CHOLHDL 5 03/06/2015   CHOLHDL 5.2 08/17/2014   Lab Results  Component Value Date   LDLDIRECT 146.0 11/07/2016   LDLDIRECT 121.0 03/06/2015   LDLDIRECT 150.3 04/10/2009      Radiology:  No results found.   Telemetry    Atrial fibrillation - Personally Reviewed  ECG     No new EKG to review- Personally Reviewed    ASSESSMENT/PLAN:   1.  Severe anemia -Hbg 6.2 on admit and dropped as low as 5.8 s/p transfusion with 3U PRBCs -Hbg this am 8.3 -colonoscopy with possible ischemic bowel -Abdominal CTA not done due to AKI on CKD -Vascular has been consulted -INR was subtherapeutic over the past month mainly around when she had her RHC so could be embolic from afib  2.  Permanent atrial fibrillation -failed multiple ADD therapies now rate controlled -was on coumadin but INR subtherapeutic over a period of 1 week around when her cath was done.   -possible cardioembolic event to bowel -she tells me that her INR has been up and down due to holding for procedures and various antibx. -she may be better candidate for Eliquis which she would have to be on 2.5mg  BID - will let Dr. [name] make final decision. -will need GI's ok to restart anticoagulation  3.  Acute on chronic diastolic CHF NYHA class 3b -recent RHC a few weeks ago with elevated filling pressures and pulmonary HTN from primary pulmonary venous HTN - torsemide up titrated. -Cardiomems implanted at time of RHC and goal < 31mmHg -was on ASA for cardiomems that was held [facility] spoke with Dr. [name]) -mildly volume overloaded on exam with increased LE edema -given IV Lasix but now with bump in creatinine -Holding lasix today and restarting Torsemide tomorrow if renal function stable.  Home regimen is Torsemide 100mg  BID, spiro 25mg  daily and metolazone 5mg  once weekly  4.  NIDCM -s/p AICD secondary to remote LV dysfunction in the past with normalization and EF 60-65% on echo 6/20    Fransico Him, MD  02/28/2019  12:58 PM

## 2019-02-28 NOTE — Interval H&P Note (Signed)
History and Physical Interval Note:  02/28/2019 9:42 AM  [name]  has presented today for surgery, with the diagnosis of Anemia.  The various methods of treatment have been discussed with the patient and family. After consideration of risks, benefits and other options for treatment, the patient has consented to  Procedure(s): ESOPHAGOGASTRODUODENOSCOPY (EGD) WITH PROPOFOL (N/A) COLONOSCOPY WITH PROPOFOL (N/A) as a surgical intervention.  The patient's history has been reviewed, patient examined, no change in status, stable for surgery.  I have reviewed the patient's chart and labs.  Questions were answered to the patient's satisfaction.     Milus Banister

## 2019-02-28 NOTE — Op Note (Signed)
[facility] Patient Name: [name] Procedure Date : 02/28/2019 MRN: [id number] Attending MD: Milus Banister , MD Date of Birth: 07-28-1933 CSN: [id number] Age: 85 Admit Type: Inpatient Procedure:                Colonoscopy Indications:              Iron deficiency anemia, acute on chronic Providers:                Milus Banister, MD, Glori Bickers, RN, Laverda Sorenson, Technician, Luane School, CRNA Referring MD:              Medicines:                Monitored Anesthesia Care Complications:            No immediate complications. Estimated blood loss:                            None. Estimated Blood Loss:     Estimated blood loss: none. Procedure:                Pre-Anesthesia Assessment:                           - Prior to the procedure, a History and Physical                            was performed, and patient medications and                            allergies were reviewed. The patient's tolerance of                            previous anesthesia was also reviewed. The risks                            and benefits of the procedure and the sedation                            options and risks were discussed with the patient.                            All questions were answered, and informed consent                            was obtained. Prior Anticoagulants: The patient has                            taken Coumadin (warfarin), last dose was 3 days                            prior to procedure. ASA Grade Assessment: IV - A  patient with severe systemic disease that is a                            constant threat to life. After reviewing the risks                            and benefits, the patient was deemed in                            satisfactory condition to undergo the procedure.                           After obtaining informed consent, the colonoscope                            was passed  under direct vision. Throughout the                            procedure, the patient's blood pressure, pulse, and                            oxygen saturations were monitored continuously. The                            [id number]-H190DL ([id number]) Olympus pediatric colonscope                            was introduced through the anus and advanced to the                            the cecum, identified by appendiceal orifice and                            ileocecal valve. The colonoscopy was performed                            without difficulty. The patient tolerated the                            procedure well. The quality of the bowel                            preparation was good. The ileocecal valve,                            appendiceal orifice, and rectum were photographed. Scope In: 10:13:39 AM Scope Out: 10:37:14 AM Scope Withdrawal Time: 0 hours 15 minutes 49 seconds  Total Procedure Duration: 0 hours 23 minutes 35 seconds  Findings:      Diverticulosis changes throughtout the colon      There were two patches of moderate inflammation in the sigmoid colon.       Both were 2-3cm across, edematous, fibrotic and firm on biopsies. I had       initial concern for neoplasm after noting the most proximal  site and       tattoo'd it with submcucosal injection of SPOT in two sites. After       noting the more distal site of inflammation I have less concern for       neoplasia and more concern for ischemic, possibly chronic.      The exam was otherwise without abnormality on direct and retroflexion       views. Impression:               - Diverticulosis throughout the colon.                           - Patchy, moderate inflammation in the sigmoid                            colon. I have most concern for ischemic, possibly                            chronic. Two sites were biopsied. These findings                            could possibly explain acute on chronic, heme +                             anemia. Recommendation:           - I have concerns about what appear to be ischemic                            changes in her left colon causing bleeding, anemia.                            I recommend up to date cardilogy consultation as                            she has known CHF, Afib and also consider                            messenteric vascular workup.                           - For now OK to resume her blood thinner and                            observe for overt bleeding, transfuse as needed.                           - Will proceed with EGD now to exclude other                            possible sites of anemia. Procedure Code(s):        --- Professional ---                           [phone], Colonoscopy, flexible; with biopsy, single  or multiple Diagnosis Code(s):        --- Professional ---                           K52.9, Noninfective gastroenteritis and colitis,                            unspecified                           D50.9, Iron deficiency anemia, unspecified CPT copyright 2019 American Medical Association. All rights reserved. The codes documented in this report are preliminary and upon coder review may  be revised to meet current compliance requirements. Milus Banister, MD 02/28/2019 10:55:14 AM This report has been signed electronically. Number of Addenda: 0

## 2019-02-28 NOTE — Progress Notes (Signed)
Pt complains pain on legs, pain scale 9, request tylenol 1000 m g po, notify provider, new order noted

## 2019-02-28 NOTE — Progress Notes (Addendum)
[place] for Coumadin>> apixaban Indication: atrial fibrillation  Allergies  Allergen Reactions  . Dilaudid [Hydromorphone] Other (See Comments)    Excessive Somnolence- Required Narcan  . Fentanyl Other (See Comments)    Required Narcan when given with Dilaudid. Tolerated single dose when given during procedure 02/03/2019.   Marland Kitchen Codeine Nausea And Vomiting    Hallucinations   . Morphine And Related Nausea And Vomiting    Hallucinations   . Sulfonamide Derivatives Other (See Comments)    Dry mouth    Patient Measurements: Height: 5\' 6"  (167.6 cm) Weight: 158 lb (71.7 kg) IBW/kg (Calculated) : 59.3 Heparin Dosing Weight: 71.7 kg  Vital Signs: Temp: 97.4 F (36.3 C) (10/25 1144) Temp Source: Oral (10/25 1144) BP: 112/57 (10/25 1144) Pulse Rate: 72 (10/25 1144)  Labs: Recent Labs    02/26/19 0736 02/27/19 0443 02/28/19 0730  HGB 7.5* 7.2* 8.3*  HCT 24.8* 24.2* 27.5*  PLT 226 243 267  LABPROT 26.6* 19.3* 15.3*  INR 2.5* 1.7* 1.2  CREATININE 1.77* 1.88* 1.77*    Estimated Creatinine Clearance: 24.9 mL/min (A) (by C-G formula based on SCr of 1.77 mg/dL (H)).   Medical History: Past Medical History:  Diagnosis Date  . Adjustment disorder with anxiety   . Anginal pain ([place])   . Arthritis    "some in my hands" (11/11/2012)  . Atrial fibrillation ([place])   . Automatic implantable cardioverter-defibrillator in situ   . Blood in stool   . Carotid artery stenosis 09/2007   a. 09/2007: 60-79% bilateral (stable); b. 10/2008: 40-59% R 60-79%   . Chest pain, unspecified   . Chronic airway obstruction, not elsewhere classified   . Chronic bronchitis ([place])    "get it some; not q year" (11/11/2012)  . Chronic diastolic CHF (congestive heart failure) ([place]) 06/04/2013  . Chronic kidney disease, unspecified   . Coronary artery disease    non-obstructive by 2006 cath  . Frequent UTI    "get them a couple times/yr" (11/11/2012)  . Hemorrhage of  rectum and anus   . High cholesterol   . History of blood transfusion 04/2011   "after hip OR" (11/11/2012)  . Hypertension 05/20/2011  . Hypertr obst cardiomyop   . Hypotension, unspecified    cardiac cath 2006..nonobstructive CAD 30-40s lesions.Marland KitchenETT 1/09 nondiagnostic due to poor HR response..Right Renal Cancer 2003  . Long term (current) use of anticoagulants   . Malignant neoplasm of kidney, except pelvis   . Osteoarthritis of right hip   . Other and unspecified coagulation defects   . PONV (postoperative nausea and vomiting)   . Renal cancer ([place]) 06/2001   Right  . Secondary cardiomyopathy, unspecified   . Sinus bradycardia   . Urge incontinence    Assessment: 85 yo female on chronic Coumadin PTA admitted with concern for GIB and Coumadin was held.  Patient is now s/p EGD and colonoscopy.  Received 10 mg of Vitamin K on 10/23, and again 10/24.  Pharmacy consult to change from coumadin to apixaban -SCr= 1.77, hg= 8.3, INR= 1.2    Goal of Therapy:  INR 2-3 Monitor platelets by anticoagulation protocol: Yes   Plan:  -Start apixaban 2.5mg  po bid -Will follow pt progress  Hildred Laser, PharmD Clinical Pharmacist **Pharmacist phone directory can now be found on [email or web address] (PW [email or web address]).  Listed under [place].

## 2019-02-28 NOTE — Transfer of Care (Signed)
Immediate Anesthesia Transfer of Care Note  Patient: [name]  Procedure(s) Performed: ESOPHAGOGASTRODUODENOSCOPY (EGD) WITH PROPOFOL (N/A ) COLONOSCOPY WITH PROPOFOL (N/A ) SUBMUCOSAL TATTOO INJECTION BIOPSY  Patient Location: PACU and Endoscopy Unit  Anesthesia Type:MAC  Level of Consciousness: drowsy and patient cooperative  Airway & Oxygen Therapy: Patient Spontanous Breathing and Patient connected to nasal cannula oxygen  Post-op Assessment: Report given to RN and Post -op Vital signs reviewed and stable  Post vital signs: Reviewed and stable  Last Vitals:  Vitals Value Taken Time  BP 112/38 02/28/19 1052  Temp 36.3 C 02/28/19 1051  Pulse 67 02/28/19 1053  Resp 18 02/28/19 1053  SpO2 97 % 02/28/19 1053  Vitals shown include unvalidated device data.  Last Pain:  Vitals:   02/28/19 1051  TempSrc: Temporal  PainSc:       Patients Stated Pain Goal: 2 (76/15/18 3437)  Complications: No apparent anesthesia complications

## 2019-03-01 ENCOUNTER — Encounter (HOSPITAL_COMMUNITY): Payer: Self-pay | Admitting: Gastroenterology

## 2019-03-01 ENCOUNTER — Inpatient Hospital Stay (HOSPITAL_COMMUNITY): Payer: PPO

## 2019-03-01 DIAGNOSIS — I5033 Acute on chronic diastolic (congestive) heart failure: Secondary | ICD-10-CM | POA: Diagnosis not present

## 2019-03-01 LAB — CBC
HCT: 26.5 % — ABNORMAL LOW (ref 36.0–46.0)
Hemoglobin: 7.9 g/dL — ABNORMAL LOW (ref 12.0–15.0)
MCH: 25 pg — ABNORMAL LOW (ref 26.0–34.0)
MCHC: 29.8 g/dL — ABNORMAL LOW (ref 30.0–36.0)
MCV: 83.9 fL (ref 80.0–100.0)
Platelets: 259 10*3/uL (ref 150–400)
RBC: 3.16 MIL/uL — ABNORMAL LOW (ref 3.87–5.11)
RDW: 18.1 % — ABNORMAL HIGH (ref 11.5–15.5)
WBC: 6.8 10*3/uL (ref 4.0–10.5)
nRBC: 0.7 % — ABNORMAL HIGH (ref 0.0–0.2)

## 2019-03-01 LAB — COMPREHENSIVE METABOLIC PANEL
ALT: 19 U/L (ref 0–44)
AST: 30 U/L (ref 15–41)
Albumin: 3 g/dL — ABNORMAL LOW (ref 3.5–5.0)
Alkaline Phosphatase: 52 U/L (ref 38–126)
Anion gap: 12 (ref 5–15)
BUN: 56 mg/dL — ABNORMAL HIGH (ref 8–23)
CO2: 29 mmol/L (ref 22–32)
Calcium: 8.5 mg/dL — ABNORMAL LOW (ref 8.9–10.3)
Chloride: 95 mmol/L — ABNORMAL LOW (ref 98–111)
Creatinine, Ser: 2.01 mg/dL — ABNORMAL HIGH (ref 0.44–1.00)
GFR calc Af Amer: 26 mL/min — ABNORMAL LOW (ref >60)
GFR calc non Af Amer: 23 mL/min — ABNORMAL LOW (ref >60)
Glucose, Bld: 93 mg/dL (ref 70–99)
Potassium: 3.5 mmol/L (ref 3.5–5.1)
Sodium: 136 mmol/L (ref 135–145)
Total Bilirubin: 0.7 mg/dL (ref 0.3–1.2)
Total Protein: 5.6 g/dL — ABNORMAL LOW (ref 6.5–8.1)

## 2019-03-01 LAB — MAGNESIUM: Magnesium: 2.5 mg/dL — ABNORMAL HIGH (ref 1.7–2.4)

## 2019-03-01 MED ORDER — FUROSEMIDE 10 MG/ML IJ SOLN
80.0000 mg | Freq: Every day | INTRAMUSCULAR | Status: DC
  Administered 2019-03-01: 80 mg via INTRAVENOUS
  Filled 2019-03-01 (×2): qty 8

## 2019-03-01 MED ORDER — DEXTROSE 5 % IV SOLN
0.5000 g | Freq: Two times a day (BID) | INTRAVENOUS | Status: DC
  Administered 2019-03-01 – 2019-03-04 (×7): 0.5 g via INTRAVENOUS
  Filled 2019-03-01 (×8): qty 5

## 2019-03-01 MED ORDER — CEFAZOLIN SODIUM-DEXTROSE 1-4 GM/50ML-% IV SOLN: 1.0000 g | Freq: Three times a day (TID) | INTRAVENOUS | Status: DC

## 2019-03-01 MED ORDER — FENTANYL CITRATE (PF) 100 MCG/2ML IJ SOLN
25.0000 ug | Freq: Once | INTRAMUSCULAR | Status: AC
  Administered 2019-03-01: 25 ug via INTRAVENOUS
  Filled 2019-03-01: qty 2

## 2019-03-01 NOTE — Discharge Instructions (Signed)

## 2019-03-01 NOTE — Care Management Important Message (Signed)
Important Message  Patient Details  Name: [name] MRN: [id number] Date of Birth: 1933-06-19   Medicare Important Message Given:  Yes     [name] 03/01/2019, 12:48 PM

## 2019-03-01 NOTE — Progress Notes (Signed)
Orthopedic Tech Progress Note Patient Details:  [name] November 14, 1933 [id number]  Patient ID: [name], female   DOB: 09-06-1933, 85 y.o.   MRN: [id number]   [name] 03/01/2019, 6:42 PMRemoved left unna boot for Nurse to assess wounds.

## 2019-03-01 NOTE — Progress Notes (Signed)
PROGRESS NOTE    [name]  LEX:[id number] DOB: 11/14/1933 DOA: 02/24/2019 PCP: Elby Beck, FNP   Brief Narrative:  [name] is [name] 85 y.o. female with history of [name]. fib, diastolic CHF last EF measured was 60 to 35% in June 2020, chronic anemia, hypothyroidism, chronic and disease stage III with single functioning kidney presents to the ER because of increasing weakness fall at home and shortness of breath with lower extremity edema.  Patient states over the last 2 weeks her shortness of breath has increased with exertion with no associated chest pain.  Denies any fever chills or productive cough.  Has been feeling increasingly fatigued.  Has been taking iron tablets and noted black stools.  No obvious bleeding otherwise.  Yesterday while sitting on [employer] [place] recliner patient dozed off and fell onto the floor hit her head and this made her come to the ER.  ED Course: In the ER CT head is unremarkable chest x-ray shows mild bilateral pleural effusion EKG shows [name]. fib rate controlled.  Labs reveal hemoglobin 6.2 platelet 302 last hemoglobin was around 9.  In September last month.  High-sensitivity troponin was 74 and 70.  BNP was 314.  Patient's creatinine is 1.6.  On exam patient has significant bilateral lower extremity edema.  Patient was ordered 2 units of PRBC transfusion anemia panel and Lasix 80 mg IV and admitted for further management of acute CHF anemia and lower extremity edema.  Assessment & Plan:   Principal Problem:   Acute on chronic diastolic CHF (congestive heart failure), NYHA class 4 (HCC) Active Problems:   Permanent atrial fibrillation   COPD (chronic obstructive pulmonary disease) (HCC)   Hypertension   Chronic kidney disease (CKD), stage IV (severe) (HCC)   PAD (peripheral artery disease) (HCC)   S/p nephrectomy   Pulmonary hypertension (HCC)   Acute CHF (congestive heart failure) (HCC)   Symptomatic anemia   1. Acute on chronic diastolic CHF last EF  measured in June 2020 was 60 to 65% 1. Lasix 80 IV BID ordered with spironolactone and metolazone on hospital day 1 (metolazone ordered as home regimen once weekly on Thursday) 2. I/O, daily weights - was 154 lbs (69.9 kg) at discharge in July (162 lbs today) 3. Pt refused dopplers, but LE swelling seems to be improving 4. Appreciate cardiology recs - continue lasix 80 mg IV daily 5. S/p cardiomems - was on aspirin for this - will hold this at this time (discussed with Dr. Aundra [name])  2. Anemia - With low normal iron, low ferritin, and low % sat, suggestive of iron def anemia.  Normal folate and low normal B12 (follow MMA).  Retics hypoproliferative.   1. S/p 3 units pRBC ordered with lasix -  2. Follow stool hemoccult (pending collection) 3. GI consulted as pt on warfarin for afib with iron def anemia - colonoscopy with patchy inflammation in sigmoid - concerning for ischemic changes - biopsies taken, follow.  Recommended up to date cardiology c/s and consider mesenteric vascular w/u.  Also noted mild gastritis which was bx for H pylori. 4. Per cardiology, recommending transition to eliquis  5. Discussed with vascular who recommended mesenteric [place] in setting of her renal function - will follow  6. Will restart warfarin and watch for bleeding 7. Apparently, Dr. Aundra [name] was planning on outpatient Heme evaluation as well   3. AKI: suspect related to anemia and overload - baseline appears to be around 1.3-1.4. Follow with diuresis and transfusion. 2.01  today, diuresis per cards.  4. [name]. Fib -rate controlled.  Has AICD/pacemaker.  Eliquis started per cards.  5. COPD not actively wheezing.  6. Chronic and disease stage III with single functioning kidney follow metabolic panel closely.  7. Hyperlipidemia on statins.  8. Hypothyroidism on Synthroid check TSH (wnl)  9. Fall likely from weakness and fatigue from anemia and HF.  Patient still complains of low back pain for which I have ordered CT scan  (small bilateral effusions, s/p R nephrectomy, no hydro or obstruction - old L1 compression fx, no acute osseus abnormality). 1. Symptoms improved with lidocaine patch  # LE wounds  Cellulitis: erythema, wounds with yellow crusting and drainage to LLE.  Will start ancef for cellulitis.     - will need to hold off on unna boots at least on LLE until cellulitis is treated  DVT prophylaxis: SCD Code Status: dnr Family Communication: none at bedside- discussed with granddaughter 10/22 and 10/23 and 10/24 Disposition Plan: pending further improvement   Consultants:   none  Procedures:  Colonoscopy - Diverticulosis throughout the colon. - Patchy, moderate inflammation in the sigmoid colon. I have most concern for ischemic, possibly chronic. Two sites were biopsied. These findings could possibly explain acute on chronic, heme + anemia. - I have concerns about what appear to be ischemic changes in her left colon causing bleeding, anemia. I recommend up to date cardilogy consultation as she has known CHF, Afib and also consider messenteric vascular workup. - For now OK to resume her blood thinner and observe for overt bleeding, transfuse as needed. - Will proceed with EGD now to exclude other possible sites of anemia.  EGD - Mild gastritis, biopsied to check for H. pylori. - The examination was otherwise normal. Impression: - Return patient to hospital ward for ongoing care. - See colonoscopy report for other recommendations, no further GI testing for now. Please call or page in patient team in any further questions or concers.  Antimicrobials:  Anti-infectives (From admission, onward)   Start     Dose/Rate Route Frequency Ordered Stop   03/01/19 1400  ceFAZolin (ANCEF) IVPB 1 g/50 mL premix  Status:  Discontinued     1 g 100 mL/hr over 30 Minutes Intravenous Every 8 hours 03/01/19 1033 03/01/19 1043   03/01/19 1045  ceFAZolin (ANCEF) 0.5 g in dextrose 5 % 100 mL IVPB     0.5 g  210 mL/hr over 30 Minutes Intravenous Every 12 hours 03/01/19 1043       Subjective: C/o persistent LLE pain  Objective: Vitals:   02/28/19 1112 02/28/19 1144 02/28/19 2020 03/01/19 0424  BP: 117/78 (!) 112/57 134/65 114/62  Pulse: 73 72 85 61  Resp: (!) 24 16 16 16   Temp:  (!) 97.4 F (36.3 C) 98.4 F (36.9 C) 98.2 F (36.8 C)  TempSrc:  Oral Oral Oral  SpO2: 91% 94% 91% 92%  Weight:    73.8 kg  Height:        Intake/Output Summary (Last 24 hours) at 03/01/2019 1312 Last data filed at 03/01/2019 0929 Gross per 24 hour  Intake 1060 ml  Output -  Net 1060 ml   Filed Weights   02/28/19 0611 02/28/19 0939 03/01/19 0424  Weight: 71.8 kg 71.7 kg 73.8 kg    Examination:  General: No acute distress. Cardiovascular: Heart sounds show Mackenzye Mackel regular rate, and rhythm.  Lungs: Clear to auscultation bilaterally  Abdomen: Soft, nontender, nondistended  Neurological: Alert and oriented 3. Moves all  extremities 4. Cranial nerves II through XII grossly intact. Extremities: LLE cellulitis with crusting wounds        Data Reviewed: I have personally reviewed following labs and imaging studies  CBC: Recent Labs  Lab 02/25/19 0752 02/26/19 0736 02/27/19 0443 02/28/19 0730 03/01/19 0550  WBC 5.8 6.0 6.4 8.8 6.8  NEUTROABS 3.8  --   --   --   --   HGB 5.8* 7.5* 7.2* 8.3* 7.9*  HCT 19.8* 24.8* 24.2* 27.5* 26.5*  MCV 80.2 81.0 82.9 83.1 83.9  PLT 328 226 243 267 161   Basic Metabolic Panel: Recent Labs  Lab 02/25/19 0752 02/26/19 0736 02/27/19 0443 02/28/19 0730 03/01/19 0550  NA 138 135 135 136 136  K 3.7 4.0 3.6 3.4* 3.5  CL 97* 93* 92* 92* 95*  CO2 26 31 30 31 29   GLUCOSE 112* 101* 106* 104* 93  BUN 67* 65* 69* 58* 56*  CREATININE 1.75* 1.77* 1.88* 1.77* 2.01*  CALCIUM 9.2 8.8* 8.5* 8.9 8.5*  MG  --  2.2 2.3 2.4 2.5*   GFR: Estimated Creatinine Clearance: 22.2 mL/min ([place] Beckers) (by C-G formula based on SCr of 2.01 mg/dL (H)). Liver Function Tests: Recent Labs   Lab 02/25/19 0752 02/26/19 0736 02/27/19 0443 02/28/19 0730 03/01/19 0550  AST 25 30 28  32 30  ALT 13 14 15 20 19   ALKPHOS 55 53 54 59 52  BILITOT 1.1 1.6* 1.0 1.4* 0.7  PROT 6.2* 5.8* 5.9* 6.1* 5.6*  ALBUMIN 3.3* 3.2* 3.2* 3.4* 3.0*   No results for input(s): LIPASE, AMYLASE in the last 168 hours. No results for input(s): AMMONIA in the last 168 hours. Coagulation Profile: Recent Labs  Lab 02/24/19 2051 02/25/19 0408 02/26/19 0736 02/27/19 0443 02/28/19 0730  INR 2.5* 2.4* 2.5* 1.7* 1.2   Cardiac Enzymes: No results for input(s): CKTOTAL, CKMB, CKMBINDEX, TROPONINI in the last 168 hours. BNP (last 3 results) No results for input(s): PROBNP in the last 8760 hours. HbA1C: No results for input(s): HGBA1C in the last 72 hours. CBG: No results for input(s): GLUCAP in the last 168 hours. Lipid Profile: No results for input(s): CHOL, HDL, LDLCALC, TRIG, CHOLHDL, LDLDIRECT in the last 72 hours. Thyroid Function Tests: No results for input(s): TSH, T4TOTAL, FREET4, T3FREE, THYROIDAB in the last 72 hours. Anemia Panel: No results for input(s): VITAMINB12, FOLATE, FERRITIN, TIBC, IRON, RETICCTPCT in the last 72 hours. Sepsis Labs: No results for input(s): PROCALCITON, LATICACIDVEN in the last 168 hours.  Recent Results (from the past 240 hour(s))  SARS CORONAVIRUS 2 (TAT 6-24 HRS) Nasopharyngeal Nasopharyngeal Swab     Status: None   Collection Time: 02/25/19 12:26 AM   Specimen: Nasopharyngeal Swab  Result Value Ref Range Status   SARS Coronavirus 2 NEGATIVE NEGATIVE Final    Comment: (NOTE) SARS-CoV-2 target nucleic acids are NOT DETECTED. The SARS-CoV-2 RNA is generally detectable in upper and lower respiratory specimens during the acute phase of infection. Negative results do not preclude SARS-CoV-2 infection, do not rule out co-infections with other pathogens, and should not be used as the sole basis for treatment or other patient management decisions. Negative  results must be combined with clinical observations, patient history, and epidemiological information. The expected result is Negative. Fact Sheet for Patients: SugarRoll.be Fact Sheet for Healthcare Providers: https://www.woods-mathews.com/ This test is not yet approved or cleared by the [place] FDA and  has been authorized for detection and/or diagnosis of SARS-CoV-2 by FDA under an Emergency Use Authorization (EUA). This EUA  will remain  in effect (meaning this test can be used) for the duration of the COVID-19 declaration under Section 56 4(b)(1) of the Act, 21 U.S.C. section 360bbb-3(b)(1), unless the authorization is terminated or revoked sooner. Performed at [facility], [place] [address] [place], [place] [address]          Radiology Studies: No results found.      Scheduled Meds: . sodium chloride   Intravenous Once  . apixaban  2.5 mg Oral BID  . docusate sodium  100 mg Oral BID  . ezetimibe  10 mg Oral Daily  . fluticasone  2 spray Each Nare Daily  . furosemide  80 mg Intravenous Daily  . influenza vaccine adjuvanted  0.5 mL Intramuscular Tomorrow-1000  . levothyroxine  25 mcg Oral QAC breakfast  . lidocaine  1 patch Transdermal Q24H  . loratadine  10 mg Oral Daily  . magnesium oxide  400 mg Oral Daily  . potassium chloride  20 mEq Oral BID  . rosuvastatin  40 mg Oral Daily  . sodium chloride flush  3 mL Intravenous Once   Continuous Infusions: .  ceFAZolin (ANCEF) IV 0.5 g (03/01/19 1251)     LOS: 4 days    Time spent: over 30 min    Fayrene Helper, MD [facility] Pager [email or web address]  If 7PM-7AM, please contact night-coverage [email or web address] Password [email or web address] 03/01/2019, 1:12 PM

## 2019-03-01 NOTE — Consult Note (Addendum)
Advanced Heart Failure Team Consult Note   Primary Physician: Elby Beck, FNP PCP-Cardiologist:  Virl Axe, MD  HF MD: Dr [name].   Reason for Consultation: Heart Failure   HPI:    [name] is seen today for evaluation of heart failure at the request of Dr Radford Pax.   [name] is an 85 y/o female, followed by Dr. [name]. Patient has history of permanent atrial fibrillation, chronic diastolic CHF, CKD stage 3, smoking/prior COPD, renal cell CA s/p right nephrectomy, anemia, left hip replacement after a fall 10/2018. She has a prior history of HFrEF with nonischemic cardiomyopathy and had a Medtronic ICD placed. However, subsequently her EF has recovered. Cardiomems placed 02/03/2019.   She had telehealth visit with HF team on 02/24/19. She was felt to be volume overloaded and instructed to increase metolazone x 2 days.   On 02/25/19 she presented with [facility] via EMS  with fatigue, increased shortness of breath, and fall.  CT of head was negative. Pertinent admission labs included: Hgb 6.2, , creatinine 1.7, sodium 138, and BNP 314.  CXR showed small bilateral pleural effusions.Given 2 units PRBCs. GI consulted - EGD/Colonoscopy  - Mild gastritis , diverticulosis in the colon, concern for ischemic changes with biospy x2 obtained. Some mention of vascular consult for possible ischemic colon. Hgb up to 7.9.   Cardiology consulted. She has been diuresing for IV lasix but stopped on 10/25 due to creatinine bump. Creatinine 1.6> 1.7>1.8>1.7>2.    Complaining of leg pain. Denies SOB.    Cardiac Testing.  RHC 02/03/2019 RA mean 11 RV 66/10 PA 65/20, mean 38 PCWP mean 24 Oxygen saturations: PA 66% AO 100% Cardiac Output (Fick) 5.9  Cardiac Index (Fick) 3.2 PVR 2.37 WU Cardiac Output (Thermo) 5.08 Cardiac Index (Thermo) 2.75  PVR 2.76 WU   Echo 10/06/2018 EF 60-65% , RV normal    Review of Systems: [y] = yes, [ ]  = no   . General: Weight gain [ ] ; Weight loss [  ]; Anorexia [ ] ; Fatigue [Y ]; Fever [ ] ; Chills [ ] ; Weakness [ ]   . Cardiac: Chest pain/pressure [ ] ; Resting SOB [ ] ; Exertional SOB [Y ]; Orthopnea [Y ]; Pedal Edema [ Y]; Palpitations [ ] ; Syncope [ ] ; Presyncope [ ] ; Paroxysmal nocturnal dyspnea[ ]   . Pulmonary: Cough [ ] ; Wheezing[ ] ; Hemoptysis[ ] ; Sputum [ ] ; Snoring [ ]   . GI: Vomiting[ ] ; Dysphagia[ ] ; Melena[ ] ; Hematochezia [ ] ; Heartburn[ ] ; Abdominal pain [ ] ; Constipation [ ] ; Diarrhea [ ] ; BRBPR [ ]   . GU: Hematuria[ ] ; Dysuria [ ] ; Nocturia[ ]   . Vascular: Pain in legs with walking [Y ]; Pain in feet with lying flat [ ] ; Non-healing sores [ ] ; Stroke [ ] ; TIA [ ] ; Slurred speech [ ] ;  . Neuro: Headaches[ ] ; Vertigo[ ] ; Seizures[ ] ; Paresthesias[ ] ;Blurred vision [ ] ; Diplopia [ ] ; Vision changes [ ]   . Ortho/Skin: Arthritis [ ] ; Joint pain [ ] ; Muscle pain [ ] ; Joint swelling [ ] ; Back Pain [ Y]; Rash [ ]   . Psych: Depression[Y ]; Anxiety[ ]   . Heme: Bleeding problems [ ] ; Clotting disorders [ ] ; Anemia [Y]  . Endocrine: Diabetes [ ] ; Thyroid dysfunction[Y]  Home Medications Prior to Admission medications   Medication Sig Start Date End Date Taking? Authorizing Provider  acetaminophen (TYLENOL) 500 MG tablet Take 500-1,000 mg by mouth every 8 (eight) hours as needed for mild pain or headache.  Yes [provider]  aspirin (ASPIRIN CHILDRENS) 81 MG chewable tablet Chew 1 tablet (81 mg total) by mouth daily. 02/03/19 02/03/20 Yes Larey Dresser, MD  cetirizine (ZYRTEC) 10 MG tablet Take 10 mg by mouth daily.    Yes [provider]  conjugated estrogens (PREMARIN) vaginal cream Place 1 Applicatorful vaginally daily. Patient taking differently: Place 1 Applicatorful vaginally daily as needed (vaginal dryness).  07/31/18  Yes Elby Beck, FNP  Cranberry (AZO CRANBERRY GUMMIES) 500 MG CHEW Chew 2,000 mg by mouth daily.   Yes [provider]  ezetimibe (ZETIA) 10 MG tablet Take 1 tablet (10 mg total)  by mouth daily. 08/18/17  Yes Gollan, Kathlene November, MD  Ferrous Gluconate-C-Folic Acid (IRON-C PO) Take 1 tablet by mouth daily.   Yes [provider]  fluticasone (FLONASE) 50 MCG/ACT nasal spray Place 2 sprays into both nostrils daily. Patient taking differently: Place 2 sprays into both nostrils daily as needed for allergies or rhinitis.  04/22/18  Yes Elby Beck, FNP  hydrocortisone 2.5 % cream Apply 1 application topically daily as needed (to itchy areas).  02/04/18  Yes [provider]  levothyroxine (SYNTHROID) 25 MCG tablet Take 1 tablet (25 mcg total) by mouth daily before breakfast. 09/22/18  Yes Deboraha Sprang, MD  magnesium oxide (MAG-OX) 400 MG tablet Take 1 tablet (400 mg total) by mouth daily. 06/05/18  Yes Deboraha Sprang, MD  metolazone (ZAROXOLYN) 5 MG tablet Take 1 tablet (5 mg total) by mouth once a week. Every Thursday Patient taking differently: Take 5 mg by mouth See admin instructions. Take 5 mg by mouth once a day on Tues and Thursday, the week of 02/21/2019- then, as directed 02/23/19  Yes Clegg, Amy D, NP  potassium chloride SA (K-DUR) 20 MEQ tablet Take 2 tablets (40 mEq total) by mouth every morning AND 1 tablet (20 mEq total) at bedtime. Take 1 tablet (20 meq) by mouth twice daily, except on metolazone days take 2 tablets (40 meq) twice daily. Patient taking differently: Take 20 mEq by mouth two times a day and increase to 80 mEq two times daily on the days Zaroxolyn is taken 11/18/18  Yes Larey Dresser, MD  rosuvastatin (CRESTOR) 40 MG tablet Take 1 tablet (40 mg total) by mouth daily. Patient taking differently: Take 40 mg by mouth at bedtime.  11/11/18  Yes Larey Dresser, MD  spironolactone (ALDACTONE) 25 MG tablet Take 1 tablet (25 mg total) by mouth daily. 11/18/18  Yes Larey Dresser, MD  torsemide (DEMADEX) 20 MG tablet Take 5 tablets (100 mg total) by mouth 2 (two) times daily. 02/03/19  Yes Larey Dresser, MD  vitamin C (ASCORBIC ACID)  250 MG tablet Take 500 mg by mouth daily.   Yes [provider]  warfarin (COUMADIN) 2 MG tablet Take 1 tablet (2 mg total) by mouth as directed. Take 2 mg on Monday- Saturday and 1 Mg on Sunday Patient taking differently: Take 2-3 mg by mouth See admin instructions. Take 2 mg by mouth at bedtime on Sun/Mon/Tues/Thurs/Fri/Sat and 3 mg on Wed 10/12/18  Yes Aline August, MD  bisacodyl (DULCOLAX) 5 MG EC tablet Take 1 tablet (5 mg total) by mouth daily as needed for moderate constipation. Patient not taking: Reported on 02/25/2019 10/12/18   Aline August, MD  docusate sodium (COLACE) 100 MG capsule Take 1 capsule (100 mg total) by mouth 2 (two) times daily. Patient not taking: Reported on 02/25/2019 10/12/18  Aline August, MD    Past Medical History: Past Medical History:  Diagnosis Date  . Adjustment disorder with anxiety   . Anginal pain ([place])   . Arthritis    "some in my hands" (11/11/2012)  . Atrial fibrillation ([place])   . Automatic implantable cardioverter-defibrillator in situ   . Blood in stool   . Carotid artery stenosis 09/2007   a. 09/2007: 60-79% bilateral (stable); b. 10/2008: 40-59% R 60-79%   . Chest pain, unspecified   . Chronic airway obstruction, not elsewhere classified   . Chronic bronchitis ([place])    "get it some; not q year" (11/11/2012)  . Chronic diastolic CHF (congestive heart failure) ([place]) 06/04/2013  . Chronic kidney disease, unspecified   . Coronary artery disease    non-obstructive by 2006 cath  . Frequent UTI    "get them a couple times/yr" (11/11/2012)  . Hemorrhage of rectum and anus   . High cholesterol   . History of blood transfusion 04/2011   "after hip OR" (11/11/2012)  . Hypertension 05/20/2011  . Hypertr obst cardiomyop   . Hypotension, unspecified    cardiac cath 2006..nonobstructive CAD 30-40s lesions.Marland KitchenETT 1/09 nondiagnostic due to poor HR response..Right Renal Cancer 2003  . Long term (current) use of anticoagulants   . Malignant neoplasm of  kidney, except pelvis   . Osteoarthritis of right hip   . Other and unspecified coagulation defects   . PONV (postoperative nausea and vomiting)   . Renal cancer ([place]) 06/2001   Right  . Secondary cardiomyopathy, unspecified   . Sinus bradycardia   . Urge incontinence     Past Surgical History: Past Surgical History:  Procedure Laterality Date  . ABDOMINAL HYSTERECTOMY  1975   for benign causes  . APPENDECTOMY    . BI-VENTRICULAR PACEMAKER UPGRADE  05/04/2010  . CARDIAC CATHETERIZATION  2006  . CARDIOVERSION N/A 02/20/2018   Procedure: CARDIOVERSION;  Surgeon: Minna Merritts, MD;  Location: [facility];  Service: Cardiovascular;  Laterality: N/A;  . CARDIOVERSION N/A 03/27/2018   Procedure: CARDIOVERSION;  Surgeon: Minna Merritts, MD;  Location: [facility];  Service: Cardiovascular;  Laterality: N/A;  . CATARACT EXTRACTION W/ INTRAOCULAR LENS  IMPLANT, BILATERAL  01/2006-02-2006  . CHOLECYSTECTOMY N/A 11/11/2012   Procedure: LAPAROSCOPIC CHOLECYSTECTOMY WITH INTRAOPERATIVE CHOLANGIOGRAM;  Surgeon: Imogene Burn. Georgette Dover, MD;  Location: [place];  Service: General;  Laterality: N/A;  . EP IMPLANTABLE DEVICE N/A 02/21/2016   Procedure: ICD Generator Changeout;  Surgeon: Deboraha Sprang, MD;  Location: [facility];  Service: Cardiovascular;  Laterality: N/A;  . INSERT / REPLACE / REMOVE PACEMAKER  05-01-11   02-28-05-/05-04-10-ICD-MEDTRONIC MAXIMAL DR  . JOINT REPLACEMENT    . LAPAROSCOPIC CHOLECYSTECTOMY  11/11/2012  . LAPAROSCOPIC LYSIS OF ADHESIONS N/A 11/11/2012   Procedure: LAPAROSCOPIC LYSIS OF ADHESIONS;  Surgeon: Imogene Burn. Georgette Dover, MD;  Location: [place];  Service: General;  Laterality: N/A;  . NEPHRECTOMY Right 06/2001    S/P RENAL CELL CANCER  . PRESSURE SENSOR/CARDIOMEMS N/A 02/03/2019   Procedure: PRESSURE SENSOR/CARDIOMEMS;  Surgeon: Larey Dresser, MD;  Location: [place];  Service: Cardiovascular;  Laterality: N/A;  . RIGHT HEART CATH N/A 11/09/2018   Procedure: RIGHT  HEART CATH;  Surgeon: Larey Dresser, MD;  Location: [facility];  Service: Cardiovascular;  Laterality: N/A;  . TOTAL HIP ARTHROPLASTY Right 05/03/2011   Procedure: TOTAL HIP ARTHROPLASTY ANTERIOR APPROACH;  Surgeon: Mcarthur Rossetti;  Location: [facility];  Service: Orthopedics;  Laterality:  Right;  Removal of Cannulated Screws Right Hip, Right Direct Anterior Hip Replacement  . TOTAL HIP ARTHROPLASTY Left 10/09/2018   Procedure: TOTAL HIP ARTHROPLASTY ANTERIOR APPROACH;  Surgeon: Rod Can, MD;  Location: [place];  Service: Orthopedics;  Laterality: Left;    Family History: Family History  Problem Relation Age of Onset  . Heart failure Mother   . Breast cancer Maternal Aunt 82  . Breast cancer Cousin   . Breast cancer Other   . Colon cancer Neg Hx     Social History: Social History   Socioeconomic History  . Marital status: Widowed    Spouse name: Not on file  . Number of children: Not on file  . Years of education: Not on file  . Highest education level: Not on file  Occupational History  . Occupation: Retired    Fish farm manager: RETIRED  Social Needs  . Financial resource strain: Not on file  . Food insecurity    Worry: Not on file    Inability: Not on file  . Transportation needs    Medical: Not on file    Non-medical: Not on file  Tobacco Use  . Smoking status: Former Smoker    Packs/day: 0.50    Years: 40.00    Pack years: 20.00    Types: Cigarettes    Quit date: 05/06/2001    Years since quitting: 17.8  . Smokeless tobacco: Never Used  Substance and Sexual Activity  . Alcohol use: No    Alcohol/week: 0.0 standard drinks  . Drug use: No  . Sexual activity: Never  Lifestyle  . Physical activity    Days per week: Not on file    Minutes per session: Not on file  . Stress: Not on file  Relationships  . Social Herbalist on phone: Not on file    Gets together: Not on file    Attends religious service: Not on file    Active member of club  or organization: Not on file    Attends meetings of clubs or organizations: Not on file    Relationship status: Not on file  Other Topics Concern  . 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   ALCOHOL USE-NO   RETIRED   FORMER TOBACCO USE.....1/2 PACK X 40 YRS UNTIL 2003   Would desire CPR    Allergies:  Allergies  Allergen Reactions  . Dilaudid [Hydromorphone] Other (See Comments)    Excessive Somnolence- Required Narcan  . Fentanyl Other (See Comments)    Required Narcan when given with Dilaudid. Tolerated single dose when given during procedure 02/03/2019.   Marland Kitchen Codeine Nausea And Vomiting    Hallucinations   . Morphine And Related Nausea And Vomiting    Hallucinations   . Sulfonamide Derivatives Other (See Comments)    Dry mouth    Objective:    Vital Signs:   Temp:  [97.3 F (36.3 C)-98.4 F (36.9 C)] 98.2 F (36.8 C) (10/26 0424) Pulse Rate:  [61-85] 61 (10/26 0424) Resp:  [16-24] 16 (10/26 0424) BP: (112-134)/(35-78) 114/62 (10/26 0424) SpO2:  [91 %-98 %] 92 % (10/26 0424) Weight:  [73.8 kg] 73.8 kg (10/26 0424) Last BM Date: 02/28/19  Weight change: Filed Weights   02/28/19 0611 02/28/19 0939 03/01/19 0424  Weight: 71.8  kg 71.7 kg 73.8 kg    Intake/Output:   Intake/Output Summary (Last 24 hours) at 03/01/2019 1034 Last data filed at 03/01/2019 0929 Gross per 24 hour  Intake 1700 ml  Output -  Net 1700 ml      Physical Exam    General:  No resp difficulty. Sitting on the side of the bed. HEENT: normal Neck: supple. JVP to jaw with prominent CV waves. Carotids 2+ bilat; no bruits. No lymphadenopathy or thyromegaly appreciated. Cor: PMI nondisplaced. Irregular  rate & rhythm. No rubs, gallops . 3/6 TR  Lungs: clear  Abdomen: soft, nontender, nondistended. No hepatosplenomegaly. No bruits  or masses. Good bowel sounds. Extremities: no cyanosis, clubbing, rash, LLE tender, erythema with serous exudate, dry crust noted 2+ edema. RLE 2+ edema Neuro: alert & orientedx3, cranial nerves grossly intact. moves all 4 extremities w/o difficulty. Affect pleasant   Telemetry   A fib 60-80s Personally reviewed   EKG    A fib 75 bpm   Labs   Basic Metabolic Panel: Recent Labs  Lab 02/25/19 0752 02/26/19 0736 02/27/19 0443 02/28/19 0730 03/01/19 0550  NA 138 135 135 136 136  K 3.7 4.0 3.6 3.4* 3.5  CL 97* 93* 92* 92* 95*  CO2 26 31 30 31 29   GLUCOSE 112* 101* 106* 104* 93  BUN 67* 65* 69* 58* 56*  CREATININE 1.75* 1.77* 1.88* 1.77* 2.01*  CALCIUM 9.2 8.8* 8.5* 8.9 8.5*  MG  --  2.2 2.3 2.4 2.5*    Liver Function Tests: Recent Labs  Lab 02/25/19 0752 02/26/19 0736 02/27/19 0443 02/28/19 0730 03/01/19 0550  AST 25 30 28  32 30  ALT 13 14 15 20 19   ALKPHOS 55 53 54 59 52  BILITOT 1.1 1.6* 1.0 1.4* 0.7  PROT 6.2* 5.8* 5.9* 6.1* 5.6*  ALBUMIN 3.3* 3.2* 3.2* 3.4* 3.0*   No results for input(s): LIPASE, AMYLASE in the last 168 hours. No results for input(s): AMMONIA in the last 168 hours.  CBC: Recent Labs  Lab 02/25/19 0752 02/26/19 0736 02/27/19 0443 02/28/19 0730 03/01/19 0550  WBC 5.8 6.0 6.4 8.8 6.8  NEUTROABS 3.8  --   --   --   --   HGB 5.8* 7.5* 7.2* 8.3* 7.9*  HCT 19.8* 24.8* 24.2* 27.5* 26.5*  MCV 80.2 81.0 82.9 83.1 83.9  PLT 328 226 243 267 259    Cardiac Enzymes: No results for input(s): CKTOTAL, CKMB, CKMBINDEX, TROPONINI in the last 168 hours.  BNP: BNP (last 3 results) Recent Labs    11/04/18 1715 01/20/19 1451 02/24/19 2051  BNP 369.3* 313.4* 314.7*    ProBNP (last 3 results) No results for input(s): PROBNP in the last 8760 hours.   CBG: No results for input(s): GLUCAP in the last 168 hours.  Coagulation Studies: Recent Labs    02/27/19 0443 02/28/19 0730  LABPROT 19.3* 15.3*  INR 1.7* 1.2     Imaging    No  results found.   Medications:     Current Medications: . sodium chloride   Intravenous Once  . apixaban  2.5 mg Oral BID  . docusate sodium  100 mg Oral BID  . ezetimibe  10 mg Oral Daily  . fluticasone  2 spray Each Nare Daily  . influenza vaccine adjuvanted  0.5 mL Intramuscular Tomorrow-1000  . levothyroxine  25 mcg Oral QAC breakfast  . lidocaine  1 patch Transdermal Q24H  . loratadine  10 mg Oral Daily  . magnesium oxide  400 mg Oral Daily  . potassium chloride  20 mEq Oral BID  . rosuvastatin  40 mg Oral Daily  . sodium chloride flush  3 mL Intravenous Once     Infusions: .  ceFAZolin (ANCEF) IV          Assessment/Plan   1. Symptomatic Anemia  Hgb 5.8. GI consulted. Received 2UPRBCs.  Had EGD/Colonoscopy. Concern for ischemia, diverticulosis. Biopsy pending. .  - Hgb up to 7.9   2. A/C Diastolic HF  ECHO EF 03-88% , RV normal . Has Cardiomems.  Volume status elevated. Initially diuresed with IV lasix then stopped due to creatinine bump.  Start 80 mg IV lasix daily.  - Follow renal function closely.   3. AKI on CKD Stage III, solitary kidney Creatinine on admit 1.7. Now up to 2.  Daily BMET   4. Cellulitis, LLE  WOC followed   Primary Team starting antibiotics.  - Need use compression wraps but she wants to hold off today. Per Dr Haroldine Laws give 1 dose of fentanyl prior to application.   5. Hypothyroidism  TSH 3.9   Length of Stay: [place], NP  03/01/2019, 10:34 AM  Advanced Heart Failure Team Pager [phone] (M-F; [place])  Please contact [facility] for night-coverage after hours (4p -7a ) and weekends on [email or web address]  Patient seen and examined with the above-signed Advanced Practice Provider and/or Housestaff. I personally reviewed laboratory data, imaging studies and relevant notes. I independently examined the patient and formulated the important aspects of the plan. I have edited the note to reflect any of my changes or salient points.  I have personally discussed the plan with the patient and/or family.  85 y/o woman with h/o diastolic HF with R>L symptoms, permanent AF (CHADSVaSC = 8), CKD III-IV with solitary kidney admitted with symptomatic anemia. GI w/u suggestive of possible ischemic bowel. Warfarin stopped and now on apixaban. Major issue now is significant RHF with 2-3+ edema and with weeping lesions. Has refused wraps due to LE pain.   On exam General:  Elderly weak appearing. No resp difficulty HEENT: normal Neck: supple. JVP to jaw with prominent CV waves Carotids 2+ bilat; no bruits. No lymphadenopathy or thryomegaly appreciated. Cor: PMI nondisplaced. Irr 3/6 TR Lungs: clear with decreased BS Abdomen: soft, nontender, nondistended. No hepatosplenomegaly. No bruits or masses. Good bowel sounds. Extremities: no cyanosis, clubbing, rash, 2-3+ LE edema with weeping sores Neuro: alert & orientedx3, cranial nerves grossly intact. moves all 4 extremities w/o difficulty. Affect pleasant  She has R>L HF with significant LE edema. After extensive discussion she has agreed to trial of UNNA boots. Will diurese carefully given CKD 3-4 with solitary kidney. Currently no longer bleeding. With AF and CHADS 8 agree with apixaban. VVS to see to further evaluate for mesenteric ischemia.   Glori Bickers, MD  12:41 PM

## 2019-03-01 NOTE — Progress Notes (Signed)
PHARMACY NOTE:  ANTIMICROBIAL RENAL DOSAGE ADJUSTMENT  Current antimicrobial regimen includes a mismatch between antimicrobial dosage and estimated renal function.  As per policy approved by the Pharmacy & Therapeutics and Medical Executive Committees, the antimicrobial dosage will be adjusted accordingly.  Current antimicrobial dosage:  Cefazolin 1g IV q8h  Indication: cellulitis  Renal Function:  Estimated Creatinine Clearance: 22.2 mL/min (A) (by C-G formula based on SCr of 2.01 mg/dL (H)). []      On intermittent HD, scheduled: []      On CRRT    Antimicrobial dosage has been changed to:  500mg  IV q12h    Thank you for allowing pharmacy to be a part of this patient's care.   Arrie Senate, PharmD, BCPS Clinical Pharmacist [phone] Please check AMION for all [place] numbers 03/01/2019

## 2019-03-01 NOTE — Evaluation (Signed)
Physical Therapy Evaluation Patient Details Name: [name] MRN: [id number] DOB: 05-Apr-1934 Today's Date: 03/01/2019   History of Present Illness  85 y.o. female with history of A. fib, diastolic CHF last EF measured was 60 to 35% in June 2020, chronic anemia, hypothyroidism, chronic and disease stage III with single functioning kidney presents to the ER because of increasing weakness fall at home and shortness of breath with lower extremity edema.  Clinical Impression  Pt demonstrates deficits in functional mobility, gait, balance, strength, power, endurance, and with significant LE pain 2/2 wounds. Pt is able to transfer and ambulate with limited physical assistance and use of RW, however pt's tolerance for activity is significantly limited due to LE pain. Pt unable to tolerate LE wounds touching bed or chair thus limiting her ability to elevate LEs in an attempt to reduce LE edema. Pt will benefit from continued acute PT services to improve mobility and strength and restore independence. PT recommends HHPT, no current DME needs.    Follow Up Recommendations Home health PT;Supervision/Assistance - 24 hour    Equipment Recommendations  None recommended by PT    Recommendations for Other Services       Precautions / Restrictions Precautions Precautions: Fall Restrictions Weight Bearing Restrictions: No      Mobility  Bed Mobility Overal bed mobility: (pt sitting edge of bed upon arrival)                Transfers Overall transfer level: Needs assistance Equipment used: None Transfers: Sit to/from Stand Sit to Stand: Min guard            Ambulation/Gait Ambulation/Gait assistance: Min guard Gait Distance (Feet): 60 Feet Assistive device: Rolling walker (2 wheeled) Gait Pattern/deviations: Step-to pattern;Decreased step length - right;Decreased step length - left Gait velocity: reduced Gait velocity interpretation: <1.8 ft/sec, indicate of risk for  recurrent falls General Gait Details: pt with short step to gait, limited 2/2 LE pain  Stairs            Wheelchair Mobility    Modified Rankin (Stroke Patients Only)       Balance Overall balance assessment: Needs assistance Sitting-balance support: Bilateral upper extremity supported;Feet supported Sitting balance-Leahy Scale: Good Sitting balance - Comments: modI with UE support   Standing balance support: Bilateral upper extremity supported Standing balance-Leahy Scale: Good Standing balance comment: supervision of 1 person with BUE support of RW                             Pertinent Vitals/Pain Pain Assessment: Faces Faces Pain Scale: Hurts even more Pain Location: BLE, more intense on LLE Pain Descriptors / Indicators: Aching;Burning Pain Intervention(s): Limited activity within patient's tolerance    Home Living Family/patient expects to be discharged to:: Private residence Living Arrangements: Alone Available Help at Discharge: Family;Available PRN/intermittently Type of Home: House Home Access: Stairs to enter Entrance Stairs-Rails: Can reach both Entrance Stairs-Number of Steps: 3 Home Layout: One level Home Equipment: Cane - single point;Walker - 2 wheels;Wheelchair - manual;Bedside commode      Prior Function Level of Independence: Independent with assistive device(s)         Comments: pt ambulates household distances with cane     Hand Dominance        Extremity/Trunk Assessment   Upper Extremity Assessment Upper Extremity Assessment: Overall WFL for tasks assessed    Lower Extremity Assessment Lower Extremity Assessment: Generalized weakness;RLE deficits/detail;LLE deficits/detail RLE  Sensation: (tingling, pins and needles in BLE) LLE Sensation: (tingling, pins and needles in BLE)    Cervical / Trunk Assessment Cervical / Trunk Assessment: Normal  Communication   Communication: No difficulties  Cognition  Arousal/Alertness: Awake/alert Behavior During Therapy: WFL for tasks assessed/performed Overall Cognitive Status: Within Functional Limits for tasks assessed                                        General Comments General comments (skin integrity, edema, etc.): pt with LE wounds which cause significant pain. Pt reports being unable to touch LEs to bed or chair 2/2 pain, thus unwilling to elevate LEs    Exercises     Assessment/Plan    PT Assessment Patient needs continued PT services  PT Problem List Decreased strength;Decreased range of motion;Decreased activity tolerance;Decreased balance;Decreased mobility;Decreased knowledge of use of DME;Decreased safety awareness;Decreased knowledge of precautions;Cardiopulmonary status limiting activity;Pain;Impaired sensation       PT Treatment Interventions DME instruction;Gait training;Stair training;Functional mobility training;Therapeutic activities;Therapeutic exercise;Balance training;Neuromuscular re-education;Patient/family education    PT Goals (Current goals can be found in the Care Plan section)  Acute Rehab PT Goals Patient Stated Goal: To regain strength and improve mobility PT Goal Formulation: With patient Time For Goal Achievement: 03/15/19 Potential to Achieve Goals: Good    Frequency Min 3X/week   Barriers to discharge        Co-evaluation               AM-PAC PT "6 Clicks" Mobility  Outcome Measure Help needed turning from your back to your side while in a flat bed without using bedrails?: A Little Help needed moving from lying on your back to sitting on the side of a flat bed without using bedrails?: A Little Help needed moving to and from a bed to a chair (including a wheelchair)?: A Little Help needed standing up from a chair using your arms (e.g., wheelchair or bedside chair)?: A Little Help needed to walk in hospital room?: A Little Help needed climbing 3-5 steps with a railing? : A  Lot 6 Click Score: 17    End of Session Equipment Utilized During Treatment: Gait belt Activity Tolerance: Patient limited by pain Patient left: in bed;with call bell/phone within reach;with family/visitor present Nurse Communication: Mobility status PT Visit Diagnosis: Other abnormalities of gait and mobility (R26.89)    Time: 0923-3007 PT Time Calculation (min) (ACUTE ONLY): 34 min   Charges:   PT Evaluation $PT Eval Low Complexity: [place], PT, DPT Acute Rehabilitation Pager: [phone]   Zenaida Niece 03/01/2019, 12:53 PM

## 2019-03-01 NOTE — Progress Notes (Signed)
Orthopedic Tech Progress Note Patient Details:  [name] Dec 24, 1933 [id number]  Ortho Devices Type of Ortho Device: Ace wrap, Unna boot Ortho Device/Splint Location: bilateral unna boots Ortho Device/Splint Interventions: Application   Post Interventions Patient Tolerated: Well Instructions Provided: Care of device   [name] 03/01/2019, 5:17 PM

## 2019-03-01 NOTE — Plan of Care (Signed)

## 2019-03-02 ENCOUNTER — Inpatient Hospital Stay (HOSPITAL_COMMUNITY): Payer: PPO

## 2019-03-02 DIAGNOSIS — I739 Peripheral vascular disease, unspecified: Secondary | ICD-10-CM

## 2019-03-02 DIAGNOSIS — I774 Celiac artery compression syndrome: Secondary | ICD-10-CM

## 2019-03-02 DIAGNOSIS — K551 Chronic vascular disorders of intestine: Secondary | ICD-10-CM

## 2019-03-02 DIAGNOSIS — I5033 Acute on chronic diastolic (congestive) heart failure: Secondary | ICD-10-CM | POA: Diagnosis not present

## 2019-03-02 LAB — COMPREHENSIVE METABOLIC PANEL
ALT: 20 U/L (ref 0–44)
AST: 31 U/L (ref 15–41)
Albumin: 3 g/dL — ABNORMAL LOW (ref 3.5–5.0)
Alkaline Phosphatase: 53 U/L (ref 38–126)
Anion gap: 10 (ref 5–15)
BUN: 53 mg/dL — ABNORMAL HIGH (ref 8–23)
CO2: 29 mmol/L (ref 22–32)
Calcium: 8.5 mg/dL — ABNORMAL LOW (ref 8.9–10.3)
Chloride: 94 mmol/L — ABNORMAL LOW (ref 98–111)
Creatinine, Ser: 1.77 mg/dL — ABNORMAL HIGH (ref 0.44–1.00)
GFR calc Af Amer: 30 mL/min — ABNORMAL LOW (ref >60)
GFR calc non Af Amer: 26 mL/min — ABNORMAL LOW (ref >60)
Glucose, Bld: 97 mg/dL (ref 70–99)
Potassium: 3.9 mmol/L (ref 3.5–5.1)
Sodium: 133 mmol/L — ABNORMAL LOW (ref 135–145)
Total Bilirubin: 1 mg/dL (ref 0.3–1.2)
Total Protein: 5.5 g/dL — ABNORMAL LOW (ref 6.5–8.1)

## 2019-03-02 LAB — CBC
HCT: 26.3 % — ABNORMAL LOW (ref 36.0–46.0)
Hemoglobin: 7.7 g/dL — ABNORMAL LOW (ref 12.0–15.0)
MCH: 24.6 pg — ABNORMAL LOW (ref 26.0–34.0)
MCHC: 29.3 g/dL — ABNORMAL LOW (ref 30.0–36.0)
MCV: 84 fL (ref 80.0–100.0)
Platelets: 246 10*3/uL (ref 150–400)
RBC: 3.13 MIL/uL — ABNORMAL LOW (ref 3.87–5.11)
RDW: 18.3 % — ABNORMAL HIGH (ref 11.5–15.5)
WBC: 7.3 10*3/uL (ref 4.0–10.5)
nRBC: 0.7 % — ABNORMAL HIGH (ref 0.0–0.2)

## 2019-03-02 LAB — MAGNESIUM: Magnesium: 2.5 mg/dL — ABNORMAL HIGH (ref 1.7–2.4)

## 2019-03-02 LAB — SURGICAL PATHOLOGY

## 2019-03-02 MED ORDER — OXYCODONE HCL 5 MG PO TABS: 2.5000 mg | ORAL_TABLET | ORAL | Status: DC | PRN

## 2019-03-02 MED ORDER — FUROSEMIDE 10 MG/ML IJ SOLN
80.0000 mg | Freq: Two times a day (BID) | INTRAMUSCULAR | Status: DC
  Administered 2019-03-02 – 2019-03-05 (×8): 80 mg via INTRAVENOUS
  Filled 2019-03-02 (×7): qty 8

## 2019-03-02 MED ORDER — POTASSIUM CHLORIDE CRYS ER 20 MEQ PO TBCR
40.0000 meq | EXTENDED_RELEASE_TABLET | Freq: Once | ORAL | Status: AC
  Administered 2019-03-02: 40 meq via ORAL
  Filled 2019-03-02: qty 2

## 2019-03-02 MED ORDER — FENTANYL CITRATE (PF) 100 MCG/2ML IJ SOLN
25.0000 ug | Freq: Once | INTRAMUSCULAR | Status: AC
  Administered 2019-03-02: 25 ug via INTRAVENOUS
  Filled 2019-03-02: qty 2

## 2019-03-02 MED ORDER — FENTANYL CITRATE (PF) 100 MCG/2ML IJ SOLN: 25.0000 ug | INTRAMUSCULAR | Status: DC | PRN

## 2019-03-02 MED ORDER — OXYCODONE HCL 5 MG PO TABS
5.0000 mg | ORAL_TABLET | ORAL | Status: DC | PRN
  Filled 2019-03-02: qty 1

## 2019-03-02 MED ORDER — SODIUM CHLORIDE 0.9 % IV SOLN
INTRAVENOUS | Status: DC | PRN
  Administered 2019-03-02 – 2019-03-04 (×2): 1000 mL via INTRAVENOUS

## 2019-03-02 MED ORDER — ACETAMINOPHEN 325 MG PO TABS
650.0000 mg | ORAL_TABLET | Freq: Four times a day (QID) | ORAL | Status: DC | PRN
  Administered 2019-03-02 – 2019-03-09 (×19): 650 mg via ORAL
  Filled 2019-03-02 (×19): qty 2

## 2019-03-02 NOTE — Consult Note (Signed)
REASON FOR CONSULT:    Possible chronic mesenteric ischemia. Consult is requested by Dr. Florene Glen.   ASSESSMENT & PLAN:   CELIAC & SMA STENOSES: Her velocities by duplex are elevated however her symptoms do not fit with chronic mesenteric ischemia.  She has no postprandial abdominal pain and her only weight loss can be attributed to diuresis when she has been admitted with congestive heart failure.  Her CT scan was done without contrast.  She has stage III chronic kidney disease and therefore I would not recommend a CT angiogram to further evaluate her mesenteric vessels.  Likewise I do not think CO2 arteriography would give adequate visualization to further assess these vessels.  However again I am not convinced that her symptoms can be attributed to chronic mesenteric ischemia.  I do not think any further work-up of this is indicated.   PERIPHERAL VASCULAR DISEASE: Based on my exam I think she does have evidence of peripheral vascular disease I have ordered ABIs.  I think it is important to assess her peripheral vascular disease given that her treatment for her chronic venous insufficiency is elevation and compression.  CHRONIC VENOUS INSUFFICIENCY: She does have evidence of chronic venous insufficiency.  I do not think we need formal venous reflux testing at this point but can consider this in the future.  Pending the results of her ABIs we can determine if she can be aggressively treated with elevation and compression.  Deitra Mayo, MD Office: [phone]   HPI:   [name] is a pleasant 85 y.o. female, who was admitted on 02/25/2019 with weakness, shortness of breath, and lower extremity edema.  The patient has a history of atrial fibrillation, diastolic congestive heart failure and chronic anemia.  She also has stage III chronic kidney disease.  She had significant bilateral lower extremity edema on admission and the patient's hemoglobin was 6.2.  She has been transfused.   Her work-up this admission included upper endoscopy which showed mild gastritis.  Lower endoscopy showed diverticulosis throughout the colon.  There was patchy, moderate inflammation in the sigmoid colon possibly consistent with chronic ischemia.  There was some concern that ischemic changes in the left colon could be causing bleeding and anemia.  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  This reason vascular surgery was consulted.  On my history the patient denies any postprandial abdominal pain.  She is had some occasional vague right-sided pain which has been mild.  She has had no melena.  She has dark stools but is on iron.  I do not get any clear-cut history of claudication or rest pain.  I think her activity is fairly limited.  She has atrial fibrillation and was on Coumadin but was switched to Eliquis this admission.  She fell and broke her left hip in July and underwent a left hip replacement.  She is had chronic bilateral lower extremity swelling like related to her congestive heart failure but she also may have some underlying chronic venous insufficiency.  Past Medical History:  Diagnosis Date  . Adjustment disorder with anxiety   . Anginal pain ([place])   . Arthritis    "some in my hands" (11/11/2012)  . Atrial fibrillation ([place])   . Automatic implantable cardioverter-defibrillator in situ   . Blood in stool   . Carotid artery stenosis 09/2007   a. 09/2007: 60-79% bilateral (stable); b. 10/2008: 40-59% R 60-79%   .  Chest pain, unspecified   . Chronic airway obstruction, not elsewhere classified   . Chronic bronchitis ([place])    "get it some; not q year" (11/11/2012)  . Chronic diastolic CHF (congestive heart failure) ([place]) 06/04/2013  . Chronic kidney disease, unspecified   . Coronary artery disease    non-obstructive by 2006 cath  . Frequent UTI    "get them a couple times/yr" (11/11/2012)  .  Hemorrhage of rectum and anus   . High cholesterol   . History of blood transfusion 04/2011   "after hip OR" (11/11/2012)  . Hypertension 05/20/2011  . Hypertr obst cardiomyop   . Hypotension, unspecified    cardiac cath 2006..nonobstructive CAD 30-40s lesions.Marland KitchenETT 1/09 nondiagnostic due to poor HR response..Right Renal Cancer 2003  . Long term (current) use of anticoagulants   . Malignant neoplasm of kidney, except pelvis   . Osteoarthritis of right hip   . Other and unspecified coagulation defects   . PONV (postoperative nausea and vomiting)   . Renal cancer ([place]) 06/2001   Right  . Secondary cardiomyopathy, unspecified   . Sinus bradycardia   . Urge incontinence     Family History  Problem Relation Age of Onset  . Heart failure Mother   . Breast cancer Maternal Aunt 82  . Breast cancer Cousin   . Breast cancer Other   . Colon cancer Neg Hx     SOCIAL HISTORY: Social History   Socioeconomic History  . Marital status: Widowed    Spouse name: Not on file  . Number of children: Not on file  . Years of education: Not on file  . Highest education level: Not on file  Occupational History  . Occupation: Retired    Fish farm manager: RETIRED  Social Needs  . Financial resource strain: Not on file  . Food insecurity    Worry: Not on file    Inability: Not on file  . Transportation needs    Medical: Not on file    Non-medical: Not on file  Tobacco Use  . Smoking status: Former Smoker    Packs/day: 0.50    Years: 40.00    Pack years: 20.00    Types: Cigarettes    Quit date: 05/06/2001    Years since quitting: 17.8  . Smokeless tobacco: Never Used  Substance and Sexual Activity  . Alcohol use: No    Alcohol/week: 0.0 standard drinks  . Drug use: No  . Sexual activity: Never  Lifestyle  . Physical activity    Days per week: Not on file    Minutes per session: Not on file  . Stress: Not on file  Relationships  . Social Herbalist on phone: Not on file    Gets  together: Not on file    Attends religious service: Not on file    Active member of club or organization: Not on file    Attends meetings of clubs or organizations: Not on file    Relationship status: Not on file  . Intimate partner violence    Fear of current or ex partner: Not on file    Emotionally abused: Not on file    Physically abused: Not on file    Forced sexual activity: Not on file  Other Topics Concern  . 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   VERY ACTIVE IN HER CHURCH, HAS GROUP OF 4 BEST  FRIENDS, SELF TITLED "THE GOLDEN GIRLS".   ALCOHOL USE-NO   RETIRED   FORMER TOBACCO USE.....1/2 PACK X 40 YRS UNTIL 2003   Would desire CPR    Allergies  Allergen Reactions  . Dilaudid [Hydromorphone] Other (See Comments)    Excessive Somnolence- Required Narcan  . Fentanyl Other (See Comments)    Required Narcan when given with Dilaudid. Tolerated single dose when given during procedure 02/03/2019.   Marland Kitchen Codeine Nausea And Vomiting    Hallucinations   . Morphine And Related Nausea And Vomiting    Hallucinations   . Sulfonamide Derivatives Other (See Comments)    Dry mouth    Current Facility-Administered Medications  Medication Dose Route Frequency Provider Last Rate Last Dose  . 0.9 %  sodium chloride infusion (Manually program via Guardrails IV Fluids)   Intravenous Once Milus Banister, MD      . 0.9 %  sodium chloride infusion   Intravenous PRN Elodia Florence., MD 10 mL/hr at 03/02/19 0933 1,000 mL at 03/02/19 0933  . apixaban (ELIQUIS) tablet 2.5 mg  2.5 mg Oral BID Kris Mouton, RPH   2.5 mg at 03/02/19 9373  . bisacodyl (DULCOLAX) EC tablet 5 mg  5 mg Oral Daily PRN Milus Banister, MD      . ceFAZolin (ANCEF) 0.5 g in dextrose 5 % 100 mL IVPB  0.5 g Intravenous Q12H Einar Grad, RPH 210 mL/hr at 03/02/19 0938 0.5 g at 03/02/19 4287  . docusate sodium (COLACE) capsule 100 mg   100 mg Oral BID Milus Banister, MD   100 mg at 03/01/19 0827  . ezetimibe (ZETIA) tablet 10 mg  10 mg Oral Daily Milus Banister, MD   10 mg at 03/02/19 6811  . fluticasone (FLONASE) 50 MCG/ACT nasal spray 2 spray  2 spray Each Nare Daily Milus Banister, MD   2 spray at 03/01/19 0825  . furosemide (LASIX) injection 80 mg  80 mg Intravenous BID Larey Dresser, MD   80 mg at 03/02/19 5726  . influenza vaccine adjuvanted (FLUAD) injection 0.5 mL  0.5 mL Intramuscular Tomorrow-1000 Rise Patience, MD      . levothyroxine (SYNTHROID) tablet 25 mcg  25 mcg Oral QAC breakfast Milus Banister, MD   25 mcg at 03/02/19 2035  . lidocaine (LIDODERM) 5 % 1 patch  1 patch Transdermal Q24H Milus Banister, MD   1 patch at 03/01/19 1240  . loratadine (CLARITIN) tablet 10 mg  10 mg Oral Daily Milus Banister, MD   10 mg at 03/02/19 0916  . magnesium oxide (MAG-OX) tablet 400 mg  400 mg Oral Daily Milus Banister, MD   400 mg at 03/02/19 5974  . ondansetron (ZOFRAN) tablet 4 mg  4 mg Oral Q6H PRN Milus Banister, MD       Or  . ondansetron [facility]) injection 4 mg  4 mg Intravenous Q6H PRN Milus Banister, MD   4 mg at 02/28/19 0446  . potassium chloride SA (KLOR-CON) CR tablet 20 mEq  20 mEq Oral BID Milus Banister, MD   20 mEq at 03/02/19 0930  . rosuvastatin (CRESTOR) tablet 40 mg  40 mg Oral Daily Milus Banister, MD   40 mg at 03/02/19 1638  . sodium chloride flush (NS) 0.9 % injection 3 mL  3 mL Intravenous Once Milus Banister, MD        REVIEW OF SYSTEMS:  [X]   denotes positive finding, [ ]  denotes negative finding Cardiac  Comments:  Chest pain or chest pressure:    Shortness of breath upon exertion: x   Short of breath when lying flat:    Irregular heart rhythm: x       Vascular    Pain in calf, thigh, or hip brought on by ambulation:    Pain in feet at night that wakes you up from your sleep:     Blood clot in your veins:    Leg swelling:  x       Pulmonary    Oxygen at  home:    Productive cough:     Wheezing:         Neurologic    Sudden weakness in arms or legs:     Sudden numbness in arms or legs:     Sudden onset of difficulty speaking or slurred speech:    Temporary loss of vision in one eye:     Problems with dizziness:         Gastrointestinal    Blood in stool:  x   Vomited blood:         Genitourinary    Burning when urinating:     Blood in urine:        Psychiatric    Major depression:         Hematologic    Bleeding problems:    Problems with blood clotting too easily:        Skin    Rashes or ulcers: x       Constitutional    Fever or chills:     PHYSICAL EXAM:   Vitals:   03/01/19 2039 03/02/19 0624 03/02/19 0914 03/02/19 1201  BP: 95/73 105/74 119/78 (!) 125/49  Pulse: 88 75 74 60  Resp: 18 18 17    Temp: (!) 97.4 F (36.3 C) 98.8 F (37.1 C) (!) 97.3 F (36.3 C) (!) 97.4 F (36.3 C)  TempSrc: Oral Oral Oral Oral  SpO2: 92% (!) 84% 93% 90%  Weight:  75 kg    Height:        GENERAL: The patient is a well-nourished female, in no acute distress. The vital signs are documented above. CARDIAC: There is a regular rate and rhythm.  VASCULAR: I do not detect carotid bruits. On the right side she has a palpable but slightly diminished femoral pulse.  I cannot palpate pedal pulses.  She does have a compression dressing on the right leg so I can only assess the dorsalis pedis pulse. On the left side I cannot palpate a femoral pulse.  I cannot palpate pedal pulses. She has bilateral lower extremity swelling and some wounds on the left leg as documented below.      PULMONARY: There is good air exchange bilaterally without wheezing or rales. ABDOMEN: Soft and non-tender with normal pitched bowel sounds.  MUSCULOSKELETAL: There are no major deformities or cyanosis. NEUROLOGIC: No focal weakness or paresthesias are detected. SKIN: She has mild cellulitis of the left leg and some crusty wounds as documented above.  PSYCHIATRIC: The patient has a normal affect.  DATA:    LABS: I have reviewed her labs.  Her GFR is 23.  Creatinine is 1.8.  It was up to 2 this admission.  White blood cell count is 7.3.  Hemoglobin is 7.7.  Platelets 246,000.  CT ABDOMEN PELVIS: Patient had a CT abdomen and pelvis without contrast.  I am unable to evaluate the  mesenteric vessels on this noncontrast CT.  The CT scan showed some aortic atherosclerosis.  In addition the patient is status post right nephrectomy.  She had small bilateral pleural effusions.  However otherwise, the study was unremarkable.  MESENTERIC DUPLEX: I have independently interpreted her mesenteric duplex.  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

## 2019-03-02 NOTE — Progress Notes (Signed)
PROGRESS NOTE    [name]  PVX:[id number] DOB: 04-27-1934 DOA: 02/24/2019 PCP: Elby Beck, FNP   Brief Narrative:  [name] is a 85 y.o. female with history of A. fib, diastolic CHF last EF measured was 60 to 35% in June 2020, chronic anemia, hypothyroidism, chronic and disease stage III with single functioning kidney presents to the ER because of increasing weakness fall at home and shortness of breath with lower extremity edema.  Patient states over the last 2 weeks her shortness of breath has increased with exertion with no associated chest pain.  Denies any fever chills or productive cough.  Has been feeling increasingly fatigued.  Has been taking iron tablets and noted black stools.  No obvious bleeding otherwise.  Yesterday while sitting on a recliner patient dozed off and fell onto the floor hit her head and this made her come to the ER.  ED Course: In the ER CT head is unremarkable chest x-ray shows mild bilateral pleural effusion EKG shows A. fib rate controlled.  Labs reveal hemoglobin 6.2 platelet 302 last hemoglobin was around 9.  In September last month.  High-sensitivity troponin was 74 and 70.  BNP was 314.  Patient's creatinine is 1.6.  On exam patient has significant bilateral lower extremity edema.  Patient was ordered 2 units of PRBC transfusion anemia panel and Lasix 80 mg IV and admitted for further management of acute CHF anemia and lower extremity edema.  Assessment & Plan:   Principal Problem:   Acute on chronic diastolic CHF (congestive heart failure), NYHA class 4 (HCC) Active Problems:   Permanent atrial fibrillation   COPD (chronic obstructive pulmonary disease) (HCC)   Hypertension   Chronic kidney disease (CKD), stage IV (severe) (HCC)   PAD (peripheral artery disease) (HCC)   S/p nephrectomy   Pulmonary hypertension (HCC)   Acute CHF (congestive heart failure) (HCC)   Symptomatic anemia   1. Acute on chronic diastolic CHF last EF  measured in June 2020 was 60 to 65% 1. Lasix 80 IV BID ordered with spironolactone and metolazone on hospital day 1 (metolazone ordered as home regimen once weekly on Thursday) 2. I/O, daily weights - was 154 lbs (69.9 kg) at discharge in July (165 lbs today) 3. Pt refused dopplers, but LE swelling seems to be improving 4. Appreciate cardiology recs - continue lasix 80 mg IV BID per cards 5. S/p cardiomems - was on aspirin for this - will hold this at this time (discussed with Dr. [name])  2. Anemia - With low normal iron, low ferritin, and low % sat, suggestive of iron def anemia.  Normal folate and low normal B12 (follow MMA).  Retics hypoproliferative.   1. S/p 3 units pRBC ordered with lasix -  2. Follow stool hemoccult (pending collection) 3. GI consulted as pt on warfarin for afib with iron def anemia - colonoscopy with patchy inflammation in sigmoid - concerning for ischemic changes - biopsies taken, with colitis consistent with ischemic colitis.  Recommended up to date cardiology c/s and consider mesenteric vascular w/u.  Also noted mild gastritis which was bx for H pylori -> negative for H pylori, with chronic inflammation in antral and corpus mucosa. 4. Per cardiology, recommending transition to eliquis  5. 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 Will restart warfarin and watch for bleeding 7. Apparently, Dr. [name] was planning on outpatient Heme evaluation as well   3. 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 AKI: suspect related to anemia and overload - baseline appears to be around 1.3-1.4. Follow with diuresis and transfusion. 1.77 today. 1. Diuresis per cards, continue to monitor  5. A. Fib -rate controlled.  Has AICD/pacemaker.  Eliquis started per cards.  6. COPD not actively wheezing.  7. Chronic and disease stage III with single functioning  kidney follow metabolic panel closely.  8. Hyperlipidemia on statins.  9. Hypothyroidism on Synthroid check TSH (wnl)  10. Fall likely from weakness and fatigue from anemia and HF.  Patient still complains of low back pain for which I have ordered CT scan (small bilateral effusions, s/p R nephrectomy, no hydro or obstruction - old L1 compression fx, no acute osseus abnormality). 1. Symptoms improved with lidocaine patch  # LE wounds  Cellulitis: erythema, wounds with yellow crusting and drainage to LLE.  Will start ancef for LLE cellulitis.     - will need to hold off on unna boots at least on LLE until cellulitis is treated  DVT prophylaxis: SCD Code Status: dnr Family Communication: none at bedside- discussed with granddaughter 10/27 Disposition Plan: pending further improvement   Consultants:   none  Procedures:  Colonoscopy - Diverticulosis throughout the colon. - Patchy, moderate inflammation in the sigmoid colon. I have most concern for ischemic, possibly chronic. Two sites were biopsied. These findings could possibly explain acute on chronic, heme + anemia. - I have concerns about what appear to be ischemic changes in her left colon causing bleeding, anemia. I recommend up to date cardilogy consultation as she has known CHF, Afib and also consider messenteric vascular workup. - For now OK to resume her blood thinner and observe for overt bleeding, transfuse as needed. - Will proceed with EGD now to exclude other possible sites of anemia.  EGD - Mild gastritis, biopsied to check for H. pylori. - The examination was otherwise normal. Impression: - Return patient to hospital ward for ongoing care. - See colonoscopy report for other recommendations, no further GI testing for now. Please call or page in patient team in any further questions or concers.  Mesenteric [place] Mesenteric: 70 to 99% stenosis in the celiac artery and superior mesenteric artery. Areas of limited  visceral study include inferior mesenteric artery. Somewhat limited evaluation of mesenteric vasculature, IMA not identified on this exam due to overlaying bowel and gas.   *See table(s) above for measurements and observations.   Diagnosing physician: Deitra Mayo MD  Antimicrobials:  Anti-infectives (From admission, onward)   Start     Dose/Rate Route Frequency Ordered Stop   03/01/19 1400  ceFAZolin (ANCEF) IVPB 1 g/50 mL premix  Status:  Discontinued     1 g 100 mL/hr over 30 Minutes Intravenous Every 8 hours 03/01/19 1033 03/01/19 1043   03/01/19 1045  ceFAZolin (ANCEF) 0.5 g in dextrose 5 % 100 mL IVPB     0.5 g 210 mL/hr over 30 Minutes Intravenous Every 12 hours 03/01/19 1043       Subjective: Persistent LLE pain RLE feel better after unna boot  Objective: Vitals:   03/01/19 2039 03/02/19 0624 03/02/19 0914 03/02/19 1201  BP: 95/73 105/74 119/78 (!) 125/49  Pulse: 88 75 74 60  Resp: 18 18 17    Temp: (!) 97.4 F (36.3 C) 98.8 F (37.1 C) (!) 97.3 F (36.3 C) (!) 97.4 F (36.3 C)  TempSrc: Oral Oral Oral Oral  SpO2: 92% (!) 84%  93% 90%  Weight:  75 kg    Height:        Intake/Output Summary (Last 24 hours) at 03/02/2019 1417 Last data filed at 03/02/2019 1105 Gross per 24 hour  Intake 829.81 ml  Output 700 ml  Net 129.81 ml   Filed Weights   02/28/19 0939 03/01/19 0424 03/02/19 0624  Weight: 71.7 kg 73.8 kg 75 kg    Examination:  General: No acute distress. Cardiovascular: Heart sounds show a regular rate, and rhythm.  Lungs: Clear to auscultation bilaterally Abdomen: Soft, nontender, nondistended  Neurological: Alert and oriented 3. Moves all extremities 4. Cranial nerves II through XII grossly intact. Skin: Warm and dry. No rashes or lesions. Extremities: bilateral LE edema, RLE with unna boot.  LLE with cellulitis and wounds with golden yellow crusting        Data Reviewed: I have personally reviewed following labs and imaging  studies  CBC: Recent Labs  Lab 02/25/19 0752 02/26/19 0736 02/27/19 0443 02/28/19 0730 03/01/19 0550 03/02/19 0431  WBC 5.8 6.0 6.4 8.8 6.8 7.3  NEUTROABS 3.8  --   --   --   --   --   HGB 5.8* 7.5* 7.2* 8.3* 7.9* 7.7*  HCT 19.8* 24.8* 24.2* 27.5* 26.5* 26.3*  MCV 80.2 81.0 82.9 83.1 83.9 84.0  PLT 328 226 243 267 259 301   Basic Metabolic Panel: Recent Labs  Lab 02/26/19 0736 02/27/19 0443 02/28/19 0730 03/01/19 0550 03/02/19 0431  NA 135 135 136 136 133*  K 4.0 3.6 3.4* 3.5 3.9  CL 93* 92* 92* 95* 94*  CO2 31 30 31 29 29   GLUCOSE 101* 106* 104* 93 97  BUN 65* 69* 58* 56* 53*  CREATININE 1.77* 1.88* 1.77* 2.01* 1.77*  CALCIUM 8.8* 8.5* 8.9 8.5* 8.5*  MG 2.2 2.3 2.4 2.5* 2.5*   GFR: Estimated Creatinine Clearance: 25.4 mL/min (A) (by C-G formula based on SCr of 1.77 mg/dL (H)). Liver Function Tests: Recent Labs  Lab 02/26/19 0736 02/27/19 0443 02/28/19 0730 03/01/19 0550 03/02/19 0431  AST 30 28 32 30 31  ALT 14 15 20 19 20   ALKPHOS 53 54 59 52 53  BILITOT 1.6* 1.0 1.4* 0.7 1.0  PROT 5.8* 5.9* 6.1* 5.6* 5.5*  ALBUMIN 3.2* 3.2* 3.4* 3.0* 3.0*   No results for input(s): LIPASE, AMYLASE in the last 168 hours. No results for input(s): AMMONIA in the last 168 hours. Coagulation Profile: Recent Labs  Lab 02/24/19 2051 02/25/19 0408 02/26/19 0736 02/27/19 0443 02/28/19 0730  INR 2.5* 2.4* 2.5* 1.7* 1.2   Cardiac Enzymes: No results for input(s): CKTOTAL, CKMB, CKMBINDEX, TROPONINI in the last 168 hours. BNP (last 3 results) No results for input(s): PROBNP in the last 8760 hours. HbA1C: No results for input(s): HGBA1C in the last 72 hours. CBG: No results for input(s): GLUCAP in the last 168 hours. Lipid Profile: No results for input(s): CHOL, HDL, LDLCALC, TRIG, CHOLHDL, LDLDIRECT in the last 72 hours. Thyroid Function Tests: No results for input(s): TSH, T4TOTAL, FREET4, T3FREE, THYROIDAB in the last 72 hours. Anemia Panel: No results for  input(s): VITAMINB12, FOLATE, FERRITIN, TIBC, IRON, RETICCTPCT in the last 72 hours. Sepsis Labs: No results for input(s): PROCALCITON, LATICACIDVEN in the last 168 hours.  Recent Results (from the past 240 hour(s))  SARS CORONAVIRUS 2 (TAT 6-24 HRS) Nasopharyngeal Nasopharyngeal Swab     Status: None   Collection Time: 02/25/19 12:26 AM   Specimen: Nasopharyngeal Swab  Result Value Ref Range Status  SARS Coronavirus 2 NEGATIVE NEGATIVE Final    Comment: (NOTE) SARS-CoV-2 target nucleic acids are NOT DETECTED. The SARS-CoV-2 RNA is generally detectable in upper and lower respiratory specimens during the acute phase of infection. Negative results do not preclude SARS-CoV-2 infection, do not rule out co-infections with other pathogens, and should not be used as the sole basis for treatment or other patient management decisions. Negative results must be combined with clinical observations, patient history, and epidemiological information. The expected result is Negative. Fact Sheet for Patients: SugarRoll.be Fact Sheet for Healthcare Providers: https://www.woods-mathews.com/ This test is not yet approved or cleared by the [place] FDA and  has been authorized for detection and/or diagnosis of SARS-CoV-2 by FDA under an Emergency Use Authorization (EUA). This EUA will remain  in effect (meaning this test can be used) for the duration of the COVID-19 declaration under Section 56 4(b)(1) of the Act, 21 U.S.C. section 360bbb-3(b)(1), unless the authorization is terminated or revoked sooner. Performed at [facility], [place] [address] [place], [place] [address]          Radiology Studies: Vas [place] Mesenteric  Result Date: 03/02/2019 ABDOMINAL VISCERAL Indications: Anemia Limitations: Air/bowel gas and patient discomfort. Performing Technologist: Antonieta Pert RDMS, RVT  Examination Guidelines: A complete evaluation includes  B-mode imaging, spectral Doppler, color Doppler, and power Doppler as needed of all accessible portions of each vessel. Bilateral testing is considered an integral part of a complete examination. Limited examinations for reoccurring indications may be performed as noted.  Duplex Findings: +----------------------+--------+--------+------+-------------------+ Mesenteric            PSV cm/sEDV cm/sPlaque     Comments       +----------------------+--------+--------+------+-------------------+ Aorta Mid                63      20                             +----------------------+--------+--------+------+-------------------+ Aorta Distal             74      21                             +----------------------+--------+--------+------+-------------------+ Celiac Artery Origin    318      46           > 70% stenosis    +----------------------+--------+--------+------+-------------------+ Celiac Artery Proximal  205      20                             +----------------------+--------+--------+------+-------------------+ SMA Origin              291      22           > 70% stenosis    +----------------------+--------+--------+------+-------------------+ SMA Proximal            249      27                             +----------------------+--------+--------+------+-------------------+ SMA Mid                 210      38                             +----------------------+--------+--------+------+-------------------+  SMA Distal              126      29                             +----------------------+--------+--------+------+-------------------+ CHA                     126      24                             +----------------------+--------+--------+------+-------------------+ IMA                     161      40         not well visualized +----------------------+--------+--------+------+-------------------+    Summary: Largest Aortic Diameter: 2.5 cm   Mesenteric: 70 to 99% stenosis in the celiac artery and superior mesenteric artery. Areas of limited visceral study include inferior mesenteric artery. Somewhat limited evaluation of mesenteric vasculature, IMA not identified on this exam due to overlaying bowel and gas.  *See table(s) above for measurements and observations.  Diagnosing physician: Deitra Mayo MD  Electronically signed by Deitra Mayo MD on 03/02/2019 at 11:52:28 AM.    Final         Scheduled Meds: . sodium chloride   Intravenous Once  . apixaban  2.5 mg Oral BID  . docusate sodium  100 mg Oral BID  . ezetimibe  10 mg Oral Daily  . fluticasone  2 spray Each Nare Daily  . furosemide  80 mg Intravenous BID  . influenza vaccine adjuvanted  0.5 mL Intramuscular Tomorrow-1000  . levothyroxine  25 mcg Oral QAC breakfast  . lidocaine  1 patch Transdermal Q24H  . loratadine  10 mg Oral Daily  . magnesium oxide  400 mg Oral Daily  . potassium chloride  20 mEq Oral BID  . rosuvastatin  40 mg Oral Daily  . sodium chloride flush  3 mL Intravenous Once   Continuous Infusions: . sodium chloride 1,000 mL (03/02/19 0933)  .  ceFAZolin (ANCEF) IV 0.5 g (03/02/19 0938)     LOS: 5 days    Time spent: over 30 min    Fayrene Helper, MD [facility] Pager AMION  If 7PM-7AM, please contact night-coverage [email or web address] Password [email or web address] 03/02/2019, 2:17 PM

## 2019-03-02 NOTE — Progress Notes (Addendum)
Advanced Heart Failure Rounding Note  PCP-Cardiologist: Virl Axe, MD   Subjective:    Did not sleep well last night due to leg pain, L>R. Continues w/ weeping edema. Unna boot on Rt LEE. No resting dyspnea. No current abdominal pain. Denies post prandial abdominal angina.    Objective:   Weight Range: 75 kg Body mass index is 26.68 kg/m.   Vital Signs:   Temp:  [97.4 F (36.3 C)-98.8 F (37.1 C)] 98.8 F (37.1 C) (10/27 0624) Pulse Rate:  [70-88] 75 (10/27 0624) Resp:  [18] 18 (10/27 0624) BP: (95-123)/(73-78) 105/74 (10/27 0624) SpO2:  [84 %-96 %] 84 % (10/27 0624) Weight:  [75 kg] 75 kg (10/27 0624) Last BM Date: 02/28/19  Weight change: Filed Weights   02/28/19 0939 03/01/19 0424 03/02/19 0624  Weight: 71.7 kg 73.8 kg 75 kg    Intake/Output:   Intake/Output Summary (Last 24 hours) at 03/02/2019 0749 Last data filed at 03/01/2019 2200 Gross per 24 hour  Intake 809.81 ml  Output 1600 ml  Net -790.19 ml      Physical Exam    General:  Elderly WF. No resp difficulty HEENT: Normal Neck: Supple. Elevated JVP . Carotids 2+ bilat; no bruits. No lymphadenopathy or thyromegaly appreciated. Cor: PMI nondisplaced. Irregular rhythm, regular rate. No rubs or gallops 2/6 murmur beast heard at LUSB Lungs: Clear Abdomen: Soft, nontender, nondistended. No hepatosplenomegaly. No bruits or masses. Good bowel sounds. Extremities: No cyanosis, clubbing, rash, bilateral LEE, unna boot on RLE, LLE w/ weeping edema and chronic venous stasis dermatitis  Neuro: Alert & orientedx3, cranial nerves grossly intact. moves all 4 extremities w/o difficulty. Affect pleasant   Telemetry   Irregular rhythm, 70s   EKG    N/a  Labs    CBC Recent Labs    03/01/19 0550 03/02/19 0431  WBC 6.8 7.3  HGB 7.9* 7.7*  HCT 26.5* 26.3*  MCV 83.9 84.0  PLT 259 127   Basic Metabolic Panel Recent Labs    03/01/19 0550 03/02/19 0431  NA 136 133*  K 3.5 3.9  CL 95* 94*  CO2  29 29  GLUCOSE 93 97  BUN 56* 53*  CREATININE 2.01* 1.77*  CALCIUM 8.5* 8.5*  MG 2.5* 2.5*   Liver Function Tests Recent Labs    03/01/19 0550 03/02/19 0431  AST 30 31  ALT 19 20  ALKPHOS 52 53  BILITOT 0.7 1.0  PROT 5.6* 5.5*  ALBUMIN 3.0* 3.0*   No results for input(s): LIPASE, AMYLASE in the last 72 hours. Cardiac Enzymes No results for input(s): CKTOTAL, CKMB, CKMBINDEX, TROPONINI in the last 72 hours.  BNP: BNP (last 3 results) Recent Labs    11/04/18 1715 01/20/19 1451 02/24/19 2051  BNP 369.3* 313.4* 314.7*    ProBNP (last 3 results) No results for input(s): PROBNP in the last 8760 hours.   D-Dimer No results for input(s): DDIMER in the last 72 hours. Hemoglobin A1C No results for input(s): HGBA1C in the last 72 hours. Fasting Lipid Panel No results for input(s): CHOL, HDL, LDLCALC, TRIG, CHOLHDL, LDLDIRECT in the last 72 hours. Thyroid Function Tests No results for input(s): TSH, T4TOTAL, T3FREE, THYROIDAB in the last 72 hours.  Invalid input(s): FREET3  Other results:   Imaging     No results found.   Medications:     Scheduled Medications: . sodium chloride   Intravenous Once  . apixaban  2.5 mg Oral BID  . docusate sodium  100 mg Oral  BID  . ezetimibe  10 mg Oral Daily  . fluticasone  2 spray Each Nare Daily  . furosemide  80 mg Intravenous Daily  . influenza vaccine adjuvanted  0.5 mL Intramuscular Tomorrow-1000  . levothyroxine  25 mcg Oral QAC breakfast  . lidocaine  1 patch Transdermal Q24H  . loratadine  10 mg Oral Daily  . magnesium oxide  400 mg Oral Daily  . potassium chloride  20 mEq Oral BID  . rosuvastatin  40 mg Oral Daily  . sodium chloride flush  3 mL Intravenous Once     Infusions: .  ceFAZolin (ANCEF) IV 0.5 g (03/01/19 2111)     PRN Medications:  bisacodyl, ondansetron **OR** ondansetron (ZOFRAN) IV    Patient Profile   85 y/o woman with h/o diastolic HF with R>L symptoms, permanent AF (CHADSVaSC =  8), CKD III-IV with solitary kidney admitted with symptomatic anemia, hgb 5.8 on admit requiring transfusion. GI w/u suggestive of possible ischemic bowel. Warfarin stopped and now on apixaban. Major issue now is significant RHF with 2-3+ edema and with weeping lesions. Has previously refused wraps due to LE pain.   Assessment/Plan   1. Symptomatic Anemia  Hgb 5.8 on admit s/p transfusion. Hgb increased to 8.3 but trending back down, 7.9>>7.7  EGD/Colonoscopy showed mild gastritis, biopsied to check for H. Pylori pending and two patches of moderate inflammation in the sigmoid colon. Concern for ischemia, diverticulosis. Biopsy pending.   -VVS to see to further evaluate for mesenteric ischemia.  -Remains on apixaban for afib. Continue to monitor H/H -Transfuse if Hgb drops < 7.0   2. A/C Diastolic HF  ECHO w/ EF 46-50% , RV normal . Has Cardiomems. R>L HF w/ significant LE edema. Started on IV lasix, 80 mg once daily. Only 1.6L out yesterday. Volume status remains elevated. PTA was on high dose torsemide 100 mg bid. Being gentle diuresis given CKD and solitary kidney. SCr improving w/ diuresis, down from 2.01>>1.77 (baseline ~1.4-1.8). May be able to push diuretic higher to increase diuresis. Will defer diuretic dosing to MD. Continue rt sided unna boot and diuretics. LLE not wrapped given degree of weeping sores. Needs WOC - Follow renal function closely.   3. AKI on CKD Stage III, solitary kidney Creatinine on admit 1.7, bumped to 2 but trending back down to 1.7 today (baseline ~1.4-1.8) Daily BMET   4. Cellulitis, LLE  Needs WOC assistance   Primary Team starting antibiotics.   5. Hypothyroidism  TSH 3.9  6. Hyponatremia: NA 133 -Likely 2/2 hypervolemia -Fluid restrict    Length of Stay: 5  [name] Simmons, PA-C  03/02/2019, 7:49 AM  Advanced Heart Failure Team Pager [phone] (M-F; 7a - 4p)  Please contact [name] for night-coverage after hours (4p -7a ) and  weekends on [email or web address]  Patient seen with PA, agree with the above note.   Colonoscopy yesterday with concern for ischemic colitis.  Hgb trending down to 7.7.  Creatinine also trending down to 1.7.  Legs are tight and painful.  She was started on Ancef to treat cellulitis.  BP stable.   General: NAD Neck: JVP 12 cm, no thyromegaly or thyroid nodule.  Lungs: Clear to auscultation bilaterally with normal respiratory effort. CV: Nondisplaced PMI.  Heart irregular S1/S2, no S3/S4, no murmur.  1+ edema to knees bilaterally.   Abdomen: Soft, nontender, no hepatosplenomegaly, no distention.  Skin: Concern for lower leg cellulitis.  Neurologic: Alert and oriented x 3.  Psych: Normal affect. Extremities: No  clubbing or cyanosis.  HEENT: Normal.   1. Acute on chronic diastolic CHF: Echo in 2/50 with EF 60-65% with mild RV dilation. She has a Cardiomems device.  She had remote nonischemic cardiomyopathy with recovery of EF, has Medtronic ICD. Suspect that there is significant RV dysfunction. Atrial fibrillation likely contributes, but this appears to be permanent now as she has failed multiple anti-arrhythmics and is reasonably rate-controlled. RHC7/6/20showed mild-moderately elevated PCWP and RA pressure, primarily pulmonary venous hypertension. Prominent v-waves on PCWP tracing may be due to stiff ventricle/diastolic dysfunction as she had only mild MR on recent echo.On exam today, she is volume overloaded.  Creatinine is back to near her baseline.  - Lasix 80 mg IV bid today. Follow strict I/Os.  - Unna boots.  2. AKI on CKD stage 3: Creatinine improved to 1.77, this is near her baseline.  3. Pulmonary hypertension: She has pulmonary venous hypertension.  4. Anemia: She has history of Fe deficiency anemia.  Admitted with GI bleeding, colonoscopy suggestive of ischemic colitis.  Has had transfusions.  Also with diverticulitis.  - GI following.  - Switched to apixaban 2.5 mg bid.  - Getting  mesenteric [place] today, ?need to involve vascular.  5. Atrial fibrillation: Permanent.  Transitioned from warfarin to Eliquis with GI bleeding.  6. Cellulitis: Lower legs, on Ancef.   [name] 03/02/2019 9:06 AM

## 2019-03-02 NOTE — Progress Notes (Signed)
Mesenteric duplex complete. Please see CV Proc tab for preliminary results. Lita Mains- RDMS, RVT 11:03 AM  03/02/2019

## 2019-03-03 ENCOUNTER — Inpatient Hospital Stay (HOSPITAL_COMMUNITY): Payer: PPO

## 2019-03-03 DIAGNOSIS — I739 Peripheral vascular disease, unspecified: Secondary | ICD-10-CM

## 2019-03-03 DIAGNOSIS — N179 Acute kidney failure, unspecified: Secondary | ICD-10-CM

## 2019-03-03 DIAGNOSIS — I5033 Acute on chronic diastolic (congestive) heart failure: Secondary | ICD-10-CM | POA: Diagnosis not present

## 2019-03-03 DIAGNOSIS — N183 Chronic kidney disease, stage 3 unspecified: Secondary | ICD-10-CM

## 2019-03-03 LAB — CBC
HCT: 26 % — ABNORMAL LOW (ref 36.0–46.0)
Hemoglobin: 7.6 g/dL — ABNORMAL LOW (ref 12.0–15.0)
MCH: 24.2 pg — ABNORMAL LOW (ref 26.0–34.0)
MCHC: 29.2 g/dL — ABNORMAL LOW (ref 30.0–36.0)
MCV: 82.8 fL (ref 80.0–100.0)
Platelets: 237 10*3/uL (ref 150–400)
RBC: 3.14 MIL/uL — ABNORMAL LOW (ref 3.87–5.11)
RDW: 18.3 % — ABNORMAL HIGH (ref 11.5–15.5)
WBC: 5.8 10*3/uL (ref 4.0–10.5)
nRBC: 0.9 % — ABNORMAL HIGH (ref 0.0–0.2)

## 2019-03-03 LAB — COMPREHENSIVE METABOLIC PANEL
ALT: 17 U/L (ref 0–44)
AST: 28 U/L (ref 15–41)
Albumin: 3 g/dL — ABNORMAL LOW (ref 3.5–5.0)
Alkaline Phosphatase: 56 U/L (ref 38–126)
Anion gap: 9 (ref 5–15)
BUN: 50 mg/dL — ABNORMAL HIGH (ref 8–23)
CO2: 29 mmol/L (ref 22–32)
Calcium: 8.4 mg/dL — ABNORMAL LOW (ref 8.9–10.3)
Chloride: 94 mmol/L — ABNORMAL LOW (ref 98–111)
Creatinine, Ser: 1.64 mg/dL — ABNORMAL HIGH (ref 0.44–1.00)
GFR calc Af Amer: 33 mL/min — ABNORMAL LOW (ref >60)
GFR calc non Af Amer: 29 mL/min — ABNORMAL LOW (ref >60)
Glucose, Bld: 102 mg/dL — ABNORMAL HIGH (ref 70–99)
Potassium: 4.2 mmol/L (ref 3.5–5.1)
Sodium: 132 mmol/L — ABNORMAL LOW (ref 135–145)
Total Bilirubin: 1 mg/dL (ref 0.3–1.2)
Total Protein: 5.7 g/dL — ABNORMAL LOW (ref 6.5–8.1)

## 2019-03-03 LAB — METHYLMALONIC ACID, SERUM: Methylmalonic Acid, Quantitative: 451 nmol/L — ABNORMAL HIGH (ref 0–378)

## 2019-03-03 LAB — MAGNESIUM: Magnesium: 2.4 mg/dL (ref 1.7–2.4)

## 2019-03-03 MED ORDER — HYDROCORTISONE 1 % EX CREA
TOPICAL_CREAM | Freq: Two times a day (BID) | CUTANEOUS | Status: DC
  Administered 2019-03-03 – 2019-03-04 (×2): via TOPICAL
  Administered 2019-03-04: 1 via TOPICAL
  Administered 2019-03-05: 09:00:00 via TOPICAL
  Administered 2019-03-05: 1 via TOPICAL
  Administered 2019-03-06 – 2019-03-17 (×20): via TOPICAL
  Filled 2019-03-03 (×3): qty 28

## 2019-03-03 NOTE — Progress Notes (Signed)
Physical Therapy Treatment Patient Details Name: [name] MRN: [id number] DOB: [birth year] Today's Date: 03/03/2019    History of Present Illness 85 y.o. female with history of A. fib, diastolic CHF last EF measured was 60 to 35% in June 2020, chronic anemia, CKD III with single functioning kidney, presents to the ER 02/24/19 with weakness, fall, SOB and LE edema. Worked up for anemia and CHF.   PT Comments    Pt progressing with mobility. Able to further ambulation distance with RW at supervision-level, requires multiple standing rest breaks due to fatigue/DOE; SpO2 92% on RA. Pt remains limited by BLE pain, prefers to sit at EOB with legs hanging down, difficulty tolerate elevation despite education; educ on seated therex. Daughter present and supportive.    Follow Up Recommendations  Home health PT;Supervision/Assistance - 24 hour     Equipment Recommendations  None recommended by PT    Recommendations for Other Services       Precautions / Restrictions Precautions Precautions: Fall Restrictions Weight Bearing Restrictions: No    Mobility  Bed Mobility               General bed mobility comments: Received sittiing EOB  Transfers Overall transfer level: Needs assistance Equipment used: Rolling walker (2 wheeled) Transfers: Sit to/from Stand Sit to Stand: Supervision            Ambulation/Gait Ambulation/Gait assistance: Supervision Gait Distance (Feet): 120 Feet Assistive device: Rolling walker (2 wheeled) Gait Pattern/deviations: Step-through pattern;Decreased stride length Gait velocity: Decreased Gait velocity interpretation: 1.31 - 2.62 ft/sec, indicative of limited community ambulator General Gait Details: Slow, fatigued gait with RW at supervision-level; 4x standing rest break secondary to SOB, fatigue and c/o ankle pain/fatigue   Stairs             Wheelchair Mobility    Modified Rankin (Stroke Patients Only)       Balance  Overall balance assessment: Needs assistance   Sitting balance-Leahy Scale: Good       Standing balance-Leahy Scale: Fair Standing balance comment: Can static stand and take steps without UE support; dynamic stability improved with UE support                            Cognition Arousal/Alertness: Awake/alert Behavior During Therapy: WFL for tasks assessed/performed Overall Cognitive Status: Impaired/Different from baseline Area of Impairment: Attention;Memory                   Current Attention Level: Selective Memory: Decreased short-term memory         General Comments: Pt with poor attention, frequently switching conversation topic before finishing thoughts or hearing PT's answer. Likely some baseline cognition      Exercises Other Exercises Other Exercises: Bilateral calf stretch and hamstring stretch w/ blanket while seated EOB    General Comments General comments (skin integrity, edema, etc.): Daughter present and supportive      Pertinent Vitals/Pain Pain Assessment: Faces Faces Pain Scale: Hurts little more Pain Location: BLE (LLE>RLE) Pain Descriptors / Indicators: Aching;Guarding Pain Intervention(s): Monitored during session(unable to tolerate elevation in recliner or at EOB)    Home Living                      Prior Function            PT Goals (current goals can now be found in the care plan section) Progress towards PT goals: Progressing  toward goals    Frequency    Min 3X/week      PT Plan Current plan remains appropriate    Co-evaluation              AM-PAC PT "6 Clicks" Mobility   Outcome Measure  Help needed turning from your back to your side while in a flat bed without using bedrails?: None Help needed moving from lying on your back to sitting on the side of a flat bed without using bedrails?: None Help needed moving to and from a bed to a chair (including a wheelchair)?: A Little Help needed  standing up from a chair using your arms (e.g., wheelchair or bedside chair)?: A Little Help needed to walk in hospital room?: A Little Help needed climbing 3-5 steps with a railing? : A Little 6 Click Score: 20    End of Session   Activity Tolerance: Patient tolerated treatment well Patient left: in bed;with call bell/phone within reach;with family/visitor present(declining BLE elevation due to pain) Nurse Communication: Mobility status PT Visit Diagnosis: Other abnormalities of gait and mobility (R26.89)     Time: 4718-5501 PT Time Calculation (min) (ACUTE ONLY): 24 min  Charges:  $Gait Training: 8-22 mins $Therapeutic Exercise: 8-22 mins                    Mabeline Caras, PT, DPT Acute Rehabilitation Services  Pager [phone] Office [place] 03/03/2019, 12:38 PM

## 2019-03-03 NOTE — Progress Notes (Signed)
   VASCULAR SURGERY ASSESSMENT & PLAN:   COMBINED PERIPHERAL VASCULAR DISEASE AND CHRONIC VENOUS INSUFFICIENCY: She has evidence of chronic venous insufficiency on exam which would normally be treated with elevation and compression.  However she also has evidence of peripheral vascular disease and if this is significant this would limit the amount of compression that can be used.  In addition she may develop breast pain with elevation of the leg.  I have ordered ABIs and these are pending.  She does have reasonable Doppler flow in both feet by my assessment.  For now I would continue with mild compression and moderate elevation.  CELIAC AND SMA STENOSIS: I do not think that her symptoms fit with chronic mesenteric ischemia.  The patient has no postprandial abdominal pain.  She has stage III chronic kidney diseases and is not a good candidate for CT angiogram or arteriography.  I would not recommend an aggressive approach to her moderate mesenteric arterial disease unless she develops clear-cut symptoms of chronic mesenteric ischemia.  SUBJECTIVE:   No complaints this morning.  PHYSICAL EXAM:   Vitals:   03/02/19 2055 03/03/19 0117 03/03/19 0408 03/03/19 0635  BP: (!) 100/35 (!) 97/49 (!) 103/41   Pulse: (!) 59 68 (!) 59   Resp: 18 18 18    Temp: 98.3 F (36.8 C) 97.7 F (36.5 C) 97.8 F (36.6 C)   TempSrc: Oral Oral Oral   SpO2: 92% 97% 91%   Weight:    77.5 kg  Height:       Monophasic dorsalis pedis signal on the right with the Doppler. Monophasic anterior tibial and posterior tibial signal on the left with the Doppler. The wounds on the left leg are unchanged.  LABS:   Lab Results  Component Value Date   WBC 5.8 03/03/2019   HGB 7.6 (L) 03/03/2019   HCT 26.0 (L) 03/03/2019   MCV 82.8 03/03/2019   PLT 237 03/03/2019   Lab Results  Component Value Date   CREATININE 1.64 (H) 03/03/2019   Lab Results  Component Value Date   INR 1.2 02/28/2019    PROBLEM LIST:     Principal Problem:   Acute on chronic diastolic CHF (congestive heart failure), NYHA class 4 (HCC) Active Problems:   Permanent atrial fibrillation   COPD (chronic obstructive pulmonary disease) (HCC)   Hypertension   Chronic kidney disease (CKD), stage IV (severe) (HCC)   PAD (peripheral artery disease) (HCC)   S/p nephrectomy   Pulmonary hypertension (HCC)   Acute CHF (congestive heart failure) (HCC)   Symptomatic anemia   CURRENT MEDS:   . sodium chloride   Intravenous Once  . apixaban  2.5 mg Oral BID  . docusate sodium  100 mg Oral BID  . ezetimibe  10 mg Oral Daily  . fluticasone  2 spray Each Nare Daily  . furosemide  80 mg Intravenous BID  . influenza vaccine adjuvanted  0.5 mL Intramuscular Tomorrow-1000  . levothyroxine  25 mcg Oral QAC breakfast  . lidocaine  1 patch Transdermal Q24H  . loratadine  10 mg Oral Daily  . magnesium oxide  400 mg Oral Daily  . potassium chloride  20 mEq Oral BID  . rosuvastatin  40 mg Oral Daily  . sodium chloride flush  3 mL Intravenous Once    Deitra Mayo Office: [phone] 03/03/2019

## 2019-03-03 NOTE — Progress Notes (Signed)
VASCULAR LAB PRELIMINARY  PRELIMINARY  PRELIMINARY  PRELIMINARY  ABIs completed.    Preliminary report:  See CV proc for preliminary results.  Amry [name], RVT 03/03/2019, 4:02 PM

## 2019-03-03 NOTE — Progress Notes (Signed)
PROGRESS NOTE  [name] YYT:[id number] DOB: [birth year] DOA: 02/24/2019 PCP: Elby Beck, FNP  Brief History   85 year old woman complicated past medical history presented with shortness of breath and lower extremity edema.  A & P  Symptomatic anemia status post 3 units PRBC transfusion, thought secondary to ischemic colitis.  Seen by gastroenterology.  EGD and colonoscopy showed mild gastritis, H. pylori was negative, 2 patches of moderate inflammation of the colon concerning for ischemia.  Seen by vascular surgery, mesenteric ischemia not thought likely at this point. --Hemoglobin remained stable.  No evidence of ongoing bleeding. --Continue apixaban as per cardiology --Check CBC in a.m.  Acute on chronic diastolic CHF with associated hyponatremia.  Significant RV dysfunction suspected.  Atrial fibrillation likely contributes.  LVEF 60 to 65%. --Continue diuresis and treatment as per cardiology  AKI superimposed on CKD stage III, solitary kidney --Creatinine improving with diuresis.  Monitor with BMP daily.  Combined peripheral vascular disease and chronic venous insufficiency --ABIs ordered, management per vascular surgery, for now mild compression with moderate elevation  Celiac and SMA stenosis, at this point not thought to be chronic mesenteric ischemia.  No postprandial abdominal pain.  No further treatment unless develops clear-cut symptoms of chronic mesenteric ischemia  Left lower extremity cellulitis --Seems to be improving.  Can probably transition to a short course of oral antibiotics tomorrow  Atrial fibrillation --Paced rhythm on telemetry.  Continue apixaban.  Hypothyroidism.  TSH within normal limits. --Continue levothyroxine.   Resolved Hospital Problem list       DVT prophylaxis: apixaban Code Status: DNR Family Communication: daughter at bedside Disposition Plan: HHPT    Murray Hodgkins, MD  [facility] Direct contact: see  [email or web address] (further directions at bottom of note if needed) 7PM-7AM contact night coverage as at bottom of note 03/03/2019, 2:42 PM  LOS: 6 days   Significant Hospital Events   .    Consults:  Marland Kitchen Vascular surgery . Cardiology   Procedures:  .   Significant Diagnostic Tests:  Marland Kitchen    Micro Data:  .    Antimicrobials:  .   Interval History/Subjective  Feels okay, he does get winded with ambulation.  Still has significant lower extremity edema.  Objective   Vitals:  Vitals:   03/03/19 0933 03/03/19 1215  BP: (!) 145/39 126/64  Pulse: 85 77  Resp: 20 14  Temp: 97.7 F (36.5 C) 98 F (36.7 C)  SpO2: 94% 97%    Exam:  Constitutional:  . Appears calm and comfortable ENMT:  . grossly normal hearing  Respiratory:  . CTA bilaterally, no w/r/r.  . Respiratory effort normal. Cardiovascular:  . RRR, no m/r/g . 2-3+ bilateral lower extremity edema . Telemetry paced rhythm Abdomen:  . Abdomen appears grossly unremarkable.  Right side soft, nontender. Skin:  . Hypertrophy of the skin left lower leg Psychiatric:  . Mental status o Mood, affect appropriate  I have personally reviewed the following:   Today's Data  . Urine output 2 L . Sodium 132, stable, BUN stable at 50, creatinine slightly better at 1.64, magnesium 2.4, LFTs unremarkable. . Hemoglobin stable at 7.6   Scheduled Meds: . sodium chloride   Intravenous Once  . apixaban  2.5 mg Oral BID  . docusate sodium  100 mg Oral BID  . ezetimibe  10 mg Oral Daily  . fluticasone  2 spray Each Nare Daily  . furosemide  80 mg Intravenous BID  . influenza vaccine adjuvanted  0.5 mL Intramuscular  Tomorrow-1000  . levothyroxine  25 mcg Oral QAC breakfast  . lidocaine  1 patch Transdermal Q24H  . loratadine  10 mg Oral Daily  . magnesium oxide  400 mg Oral Daily  . potassium chloride  20 mEq Oral BID  . rosuvastatin  40 mg Oral Daily  . sodium chloride flush  3 mL Intravenous Once   Continuous Infusions: .  sodium chloride 1,000 mL (03/02/19 0933)  .  ceFAZolin (ANCEF) IV 0.5 g (03/03/19 1002)    Principal Problem:   Acute on chronic diastolic CHF (congestive heart failure), NYHA class 4 (HCC) Active Problems:   Permanent atrial fibrillation   COPD (chronic obstructive pulmonary disease) (HCC)   Hypertension   Chronic kidney disease (CKD), stage IV (severe) (HCC)   PAD (peripheral artery disease) (HCC)   S/p nephrectomy   Pulmonary hypertension (HCC)   Symptomatic anemia   AKI (acute kidney injury) ([place])   LOS: 6 days   How to contact the [facility] Attending or Consulting provider [place] or covering provider during after hours 7P -7A, for this patient?  1. Check the care team in [facility] and look for a) attending/consulting TRH provider listed and b) the [facility] team listed 2. Log into [email or web address] and use 's universal password to access. If you do not have the password, please contact the hospital operator. 3. Locate the [facility] provider you are looking for under [facility] and page to a number that you can be directly reached. 4. If you still have difficulty reaching the provider, please page the [facility] (Director on Call) for the Hospitalists listed on amion for assistance.

## 2019-03-03 NOTE — Progress Notes (Addendum)
Advanced Heart Failure Rounding Note  PCP-Cardiologist: Virl Axe, MD   Subjective:    Yesterday diuresed with IV lasix. Negative 992 cc. Weight up 5 pounds?   Complaining of LLE pain. Unable to tolerate unna boot. Denies SOB>   Objective:   Weight Range: 77.5 kg Body mass index is 27.57 kg/m.   Vital Signs:   Temp:  [97.4 F (36.3 C)-98.3 F (36.8 C)] 97.7 F (36.5 C) (10/28 0933) Pulse Rate:  [59-85] 85 (10/28 0933) Resp:  [18-20] 20 (10/28 0933) BP: (97-145)/(35-78) 145/39 (10/28 0933) SpO2:  [90 %-97 %] 94 % (10/28 0933) Weight:  [77.5 kg] 77.5 kg (10/28 0635) Last BM Date: 03/02/19  Weight change: Filed Weights   03/01/19 0424 03/02/19 0624 03/03/19 0635  Weight: 73.8 kg 75 kg 77.5 kg    Intake/Output:   Intake/Output Summary (Last 24 hours) at 03/03/2019 1158 Last data filed at 03/03/2019 0938 Gross per 24 hour  Intake 825 ml  Output 1700 ml  Net -875 ml      Physical Exam    General:  Sitting on the side of the bed. No resp difficulty HEENT: normal Neck: supple. no JVD. Carotids 2+ bilat; no bruits. No lymphadenopathy or thryomegaly appreciated. Cor: PMI nondisplaced. Irregular rate & rhythm. No rubs, gallops or murmurs. Lungs: clear Abdomen: soft, nontender, nondistended. No hepatosplenomegaly. No bruits or masses. Good bowel sounds. Extremities: no cyanosis, clubbing, rash, LLE 2+edema serous exudate, pink, & tender. RLE unna boot Neuro: alert & orientedx3, cranial nerves grossly intact. moves all 4 extremities w/o difficulty. Affect pleasant   Telemetry   A fib 70s personally reviewed  EKG    N/a  Labs    CBC Recent Labs    03/02/19 0431 03/03/19 0423  WBC 7.3 5.8  HGB 7.7* 7.6*  HCT 26.3* 26.0*  MCV 84.0 82.8  PLT 246 671   Basic Metabolic Panel Recent Labs    03/02/19 0431 03/03/19 0423  NA 133* 132*  K 3.9 4.2  CL 94* 94*  CO2 29 29  GLUCOSE 97 102*  BUN 53* 50*  CREATININE 1.77* 1.64*  CALCIUM 8.5* 8.4*   MG 2.5* 2.4   Liver Function Tests Recent Labs    03/02/19 0431 03/03/19 0423  AST 31 28  ALT 20 17  ALKPHOS 53 56  BILITOT 1.0 1.0  PROT 5.5* 5.7*  ALBUMIN 3.0* 3.0*   No results for input(s): LIPASE, AMYLASE in the last 72 hours. Cardiac Enzymes No results for input(s): CKTOTAL, CKMB, CKMBINDEX, TROPONINI in the last 72 hours.  BNP: BNP (last 3 results) Recent Labs    11/04/18 1715 01/20/19 1451 02/24/19 2051  BNP 369.3* 313.4* 314.7*    ProBNP (last 3 results) No results for input(s): PROBNP in the last 8760 hours.   D-Dimer No results for input(s): DDIMER in the last 72 hours. Hemoglobin A1C No results for input(s): HGBA1C in the last 72 hours. Fasting Lipid Panel No results for input(s): CHOL, HDL, LDLCALC, TRIG, CHOLHDL, LDLDIRECT in the last 72 hours. Thyroid Function Tests No results for input(s): TSH, T4TOTAL, T3FREE, THYROIDAB in the last 72 hours.  Invalid input(s): FREET3  Other results:   Imaging    No results found.   Medications:     Scheduled Medications: . sodium chloride   Intravenous Once  . apixaban  2.5 mg Oral BID  . docusate sodium  100 mg Oral BID  . ezetimibe  10 mg Oral Daily  . fluticasone  2 spray  Each Nare Daily  . furosemide  80 mg Intravenous BID  . influenza vaccine adjuvanted  0.5 mL Intramuscular Tomorrow-1000  . levothyroxine  25 mcg Oral QAC breakfast  . lidocaine  1 patch Transdermal Q24H  . loratadine  10 mg Oral Daily  . magnesium oxide  400 mg Oral Daily  . potassium chloride  20 mEq Oral BID  . rosuvastatin  40 mg Oral Daily  . sodium chloride flush  3 mL Intravenous Once    Infusions: . sodium chloride 1,000 mL (03/02/19 0933)  .  ceFAZolin (ANCEF) IV 0.5 g (03/03/19 1002)    PRN Medications: sodium chloride, acetaminophen, bisacodyl, fentaNYL (SUBLIMAZE) injection, ondansetron **OR** ondansetron (ZOFRAN) IV, oxyCODONE **OR** oxyCODONE    Patient Profile   85 y/o woman with h/o diastolic HF  with R>L symptoms, permanent AF (CHADSVaSC = 8), CKD III-IV with solitary kidney admitted with symptomatic anemia, hgb 5.8 on admit requiring transfusion. GI w/u suggestive of possible ischemic bowel. Warfarin stopped and now on apixaban. Major issue now is significant RHF with 2-3+ edema and with weeping lesions. Has previously refused wraps due to LE pain.   Assessment/Plan   1. Symptomatic Anemia  Hgb 5.8 on admit s/p transfusion. Hgb increased to 8.3 but trending back down, 7.9>>7.7  EGD/Colonoscopy showed mild gastritis, biopsied to check for H. Pylori pending and two patches of moderate inflammation in the sigmoid colon.  - Concern for ischemia, diverticulosis. Biopsy pending.   -VVS evaluated. Not thought to have mesenteric ischemia.  -Remains on apixaban for afib. Continue to monitor H/H - Hgb 7.6.  -Transfuse if Hgb drops < 7.0  2. A/C Diastolic HF  ECHO w/ EF 03-47% , RV normal . Has Cardiomems. R>L HF w/ significant LE edema.   PTA was on high dose torsemide 100 mg bid.  - Volume status elevated. Continue lasix 80 mg twice a day.  - Renal function stable.  -Continue compression wrap on R. Intolerant unna boot on L.    3. AKI on CKD Stage III, solitary kidney Baseline creatinine 1.4-1.8  Creatinine on admit 1.7, bumped to 2 but trending back down to 1.6 today   4. Cellulitis, LLE  Needs WOC assistance   Vascular following. ABI pending. Ideally need compression wraps.  Primary Team starting antibiotics.   5. Hypothyroidism  TSH 3.9  6. Hyponatremia: NA 132  -Likely 2/2 hypervolemia -Fluid restrict    Length of Stay: 6  Amy Clegg, NP  03/03/2019, 11:58 AM  Advanced Heart Failure Team Pager [phone] (M-F; 7a - 4p)  Please contact [facility] for night-coverage after hours (4p -7a ) and weekends on [email or web address]  Patient seen with NP, agree with the above note.   She appears to have diuresed yesterday on IV Lasix though not reflected in weights.  Creatinine  trending down. Mesenteric dopplers with 70-99% celiac and SMA stenosis.   General: NAD Neck: JVP 12-14 cm, no thyromegaly or thyroid nodule.  Lungs: Mild crackles at bases. CV: Nondisplaced PMI.  Heart regular S1/S2, no S3/S4, 2/6 SEM RUSB.  2+ edema to knees bilaterally.   Abdomen: Soft, nontender, no hepatosplenomegaly, no distention.  Skin: Possible cellulitis LLE  Neurologic: Alert and oriented x 3.  Psych: Normal affect. Extremities: No clubbing or cyanosis.  HEENT: Normal.   1. Acute on chronic diastolic CHF: Echo in 8/75 with EF 60-65% with mild RV dilation. She has a Cardiomems device.  She had remote nonischemic cardiomyopathy with recovery of EF, has Medtronic ICD. Suspect that  there is significant RV dysfunction. Atrial fibrillation likely contributes, but this appears to be permanent now as she has failed multiple anti-arrhythmics and is reasonably rate-controlled. RHC7/6/20showed mild-moderately elevated PCWP and RA pressure, primarily pulmonary venous hypertension. Prominent v-waves on PCWP tracing may be due to stiff ventricle/diastolic dysfunction as she had only mild MR on recent echo.On exam today, she is still volume overloaded.  Creatinine is back to near her baseline.  - Lasix 80 mg IV bid again today. Follow strict I/Os and need to weight her standing up.  - Unna boot on right, cannot tolerate on left. .  2. AKI on CKD stage 3: Creatinine improved to 1.64, this is near her baseline.  3. Pulmonary hypertension: She has pulmonary venous hypertension.  4. Anemia: She has history of Fe deficiency anemia.  Admitted with GI bleeding, colonoscopy suggestive of ischemic colitis.  Has had transfusions.  Also with diverticulosis.  - GI following.  - Switched to apixaban 2.5 mg bid.  5. Atrial fibrillation: Permanent.  Transitioned from warfarin to Eliquis with GI bleeding.  6. Cellulitis: Lower legs, on Ancef.  7. Mesenteric vascular disease: Dopplers showed 70-99% celiac and  SMA stenosis.  However, no "intestinal angina" reported.  Seen by Dr. Doren Custard, recommended conservative management unless she were to develop clear symptoms of chronic mesenteric ischemia.  - She is on Crestor.  8. Left leg pain: Ongoing.  Peripheral arterial dopplers ordered and pending.   Loralie Champagne 03/03/2019 3:16 PM

## 2019-03-03 NOTE — TOC Progression Note (Addendum)
Transition of Care [facility]) - Progression Note    Patient Details  Name: [name] MRN: [id number] Date of Birth: Sep 29, 1933  [facility] CM/SW Contact  Zenon Mayo, RN Phone Number: 03/03/2019, 11:27 AM  Clinical Narrative:    NCM spoke with patient and daughter, [name] at bedside, offered choice for HHPT, she chose [facility].  Referral made to [facility] with [facility] and she was able to take referral for HHPT.  Soc will begin 24 to 48 hrs post dc.  Eliquis copay is 45.00, will give patient the 30 day elquis coupon.   Expected Discharge Plan: [place] Barriers to Discharge: No Barriers Identified  Expected Discharge Plan and Services Expected Discharge Plan: [place] In-house Referral: NA Discharge Planning Services: CM Consult Post Acute Care Choice: [place] arrangements for the past 2 months: Single Family Home                 DME Arranged: (NA)         HH Arranged: PT HH Agency: [facility] Date [place]: 03/03/19 Time [place]: 1127 Representative spoke with at [place]: [place] ([place]) Interventions    Readmission Risk Interventions Readmission Risk Prevention Plan 02/26/2019 11/10/2018  Transportation Screening Complete Complete  PCP or Specialist Appt within 3-5 Days - Complete  HRI or [place] - Complete  Social Work Consult for [place] Planning/Counseling - Complete  Palliative Care Screening - Not Applicable  Medication Review Press photographer) Complete Complete  SW Recovery Care/Counseling Consult Complete -  Palliative Care Screening Not Applicable -  [place] Not Applicable -  Some recent data might be hidden

## 2019-03-03 NOTE — TOC Benefit Eligibility Note (Signed)
Transition of Care [facility]) Benefit Eligibility Note    Patient Details  Name: [name] MRN: [id number] Date of Birth: 01/05/1934   Medication/Dose: Arne [place]  2.5 MG BID   AND ELIQUIS  5 MG BID  Covered?: Yes  Tier: 3 Drug  Prescription Coverage Preferred Pharmacy: [place] with Person/Company/Phone Number:: [name]  @ [employer] # [phone] OPT- 2  Co-Pay: $ 45.00  Prior Approval: No          Memory Argue Phone Number: 03/03/2019, 11:03 AM

## 2019-03-04 ENCOUNTER — Ambulatory Visit: Payer: PPO

## 2019-03-04 DIAGNOSIS — I5033 Acute on chronic diastolic (congestive) heart failure: Secondary | ICD-10-CM | POA: Diagnosis not present

## 2019-03-04 DIAGNOSIS — N179 Acute kidney failure, unspecified: Secondary | ICD-10-CM

## 2019-03-04 LAB — TYPE AND SCREEN
ABO/RH(D): A NEG
Antibody Screen: POSITIVE
Donor AG Type: NEGATIVE
Unit division: 0
Unit division: 0

## 2019-03-04 LAB — BPAM RBC
Blood Product Expiration Date: 202011082359
Blood Product Expiration Date: 202011112359
Unit Type and Rh: 600
Unit Type and Rh: 9500

## 2019-03-04 LAB — CBC
HCT: 26.1 % — ABNORMAL LOW (ref 36.0–46.0)
Hemoglobin: 7.6 g/dL — ABNORMAL LOW (ref 12.0–15.0)
MCH: 24.2 pg — ABNORMAL LOW (ref 26.0–34.0)
MCHC: 29.1 g/dL — ABNORMAL LOW (ref 30.0–36.0)
MCV: 83.1 fL (ref 80.0–100.0)
Platelets: 238 10*3/uL (ref 150–400)
RBC: 3.14 MIL/uL — ABNORMAL LOW (ref 3.87–5.11)
RDW: 18.1 % — ABNORMAL HIGH (ref 11.5–15.5)
WBC: 5.8 10*3/uL (ref 4.0–10.5)
nRBC: 0.7 % — ABNORMAL HIGH (ref 0.0–0.2)

## 2019-03-04 LAB — BASIC METABOLIC PANEL
Anion gap: 12 (ref 5–15)
BUN: 46 mg/dL — ABNORMAL HIGH (ref 8–23)
CO2: 27 mmol/L (ref 22–32)
Calcium: 8.4 mg/dL — ABNORMAL LOW (ref 8.9–10.3)
Chloride: 94 mmol/L — ABNORMAL LOW (ref 98–111)
Creatinine, Ser: 1.65 mg/dL — ABNORMAL HIGH (ref 0.44–1.00)
GFR calc Af Amer: 33 mL/min — ABNORMAL LOW (ref >60)
GFR calc non Af Amer: 29 mL/min — ABNORMAL LOW (ref >60)
Glucose, Bld: 130 mg/dL — ABNORMAL HIGH (ref 70–99)
Potassium: 3.7 mmol/L (ref 3.5–5.1)
Sodium: 133 mmol/L — ABNORMAL LOW (ref 135–145)

## 2019-03-04 MED ORDER — CEPHALEXIN 250 MG PO CAPS
500.0000 mg | ORAL_CAPSULE | Freq: Two times a day (BID) | ORAL | Status: AC
  Administered 2019-03-04 – 2019-03-10 (×12): 500 mg via ORAL
  Filled 2019-03-04 (×12): qty 2

## 2019-03-04 MED ORDER — POTASSIUM CHLORIDE CRYS ER 20 MEQ PO TBCR
20.0000 meq | EXTENDED_RELEASE_TABLET | Freq: Once | ORAL | Status: AC
  Administered 2019-03-04: 20 meq via ORAL
  Filled 2019-03-04: qty 1

## 2019-03-04 MED ORDER — METOLAZONE 2.5 MG PO TABS
2.5000 mg | ORAL_TABLET | Freq: Once | ORAL | Status: AC
  Administered 2019-03-04: 2.5 mg via ORAL
  Filled 2019-03-04: qty 1

## 2019-03-04 NOTE — Progress Notes (Signed)
[place] for Apixaban Indication: atrial fibrillation  Allergies  Allergen Reactions  . Dilaudid [Hydromorphone] Other (See Comments)    Excessive Somnolence- Required Narcan  . Fentanyl Other (See Comments)    Required Narcan when given with Dilaudid. Tolerated single dose when given during procedure 02/03/2019.   Marland Kitchen Codeine Nausea And Vomiting    Hallucinations   . Morphine And Related Nausea And Vomiting    Hallucinations   . Sulfonamide Derivatives Other (See Comments)    Dry mouth    Patient Measurements: Height: 5\' 6"  (167.6 cm) Weight: 167 lb 4.8 oz (75.9 kg) IBW/kg (Calculated) : 59.3 Heparin Dosing Weight: 71.7 kg  Vital Signs: Temp: 97.6 F (36.4 C) (10/29 0553) Temp Source: Oral (10/29 0553) BP: 119/73 (10/29 0553) Pulse Rate: 73 (10/29 0553)  Labs: Recent Labs    03/02/19 0431 03/03/19 0423 03/04/19 0524  HGB 7.7* 7.6* 7.6*  HCT 26.3* 26.0* 26.1*  PLT 246 237 238  CREATININE 1.77* 1.64* 1.65*    Estimated Creatinine Clearance: 27.3 mL/min (A) (by C-G formula based on SCr of 1.65 mg/dL (H)).   Medical History: Past Medical History:  Diagnosis Date  . Adjustment disorder with anxiety   . Anginal pain ([place])   . Arthritis    "some in my hands" (11/11/2012)  . Atrial fibrillation ([place])   . Automatic implantable cardioverter-defibrillator in situ   . Blood in stool   . Carotid artery stenosis 09/2007   a. 09/2007: 60-79% bilateral (stable); b. 10/2008: 40-59% R 60-79%   . Chest pain, unspecified   . Chronic airway obstruction, not elsewhere classified   . Chronic bronchitis ([place])    "get it some; not q year" (11/11/2012)  . Chronic diastolic CHF (congestive heart failure) ([place]) 06/04/2013  . Chronic kidney disease, unspecified   . Coronary artery disease    non-obstructive by 2006 cath  . Frequent UTI    "get them a couple times/yr" (11/11/2012)  . Hemorrhage of rectum and anus   . High cholesterol   . History of  blood transfusion 04/2011   "after hip OR" (11/11/2012)  . Hypertension 05/20/2011  . Hypertr obst cardiomyop   . Hypotension, unspecified    cardiac cath 2006..nonobstructive CAD 30-40s lesions.Marland KitchenETT 1/09 nondiagnostic due to poor HR response..Right Renal Cancer 2003  . Long term (current) use of anticoagulants   . Malignant neoplasm of kidney, except pelvis   . Osteoarthritis of right hip   . Other and unspecified coagulation defects   . PONV (postoperative nausea and vomiting)   . Renal cancer ([place]) 06/2001   Right  . Secondary cardiomyopathy, unspecified   . Sinus bradycardia   . Urge incontinence    Assessment: 85 yo female on chronic Coumadin PTA admitted with concern for GIB and Coumadin was held.  Patient is now s/p EGD and colonoscopy.  Received 10 mg of Vitamin K on 10/23, and again 10/24.  Pharmacy consult to change from Coumadin to Apixaban -SCr= 1.65, hg= 7.6, INR= 1.2  Goal of Therapy:  INR 2-3 Monitor platelets by anticoagulation protocol: Yes   Plan:  -Continue apixaban 2.5mg  po bid -Pt education completed.  Marguerite Olea, [facility] Clinical Pharmacist Phone [phone]  03/04/2019 9:37 AM

## 2019-03-04 NOTE — Progress Notes (Signed)
VASCULAR SURGERY ASSESSMENT & PLAN:   COMBINED PERIPHERAL VASCULAR DISEASE AND CHRONIC VENOUS INSUFFICIENCY: I reviewed the patient's noninvasive studies from yesterday.  She does have monophasic Doppler signals in both feet.  ABIs could not be obtained because of the wounds and the dressing on the right leg.  Toe pressure was 38 mmHg on the right and 30 mmHg on the left.  This would suggest marginal perfusion for healing the leg wounds.  If the wounds worsen then I think we would need to consider CO2 arteriography.  Visualization with CO2 arteriography is less than ideal which makes any intervention more challenging.  I would be reluctant to proceed with arteriography at this point given her multiple issues including anemia (Hgb 7.6) despite multiple transfusions, congestive heart failure, COPD, chronic kidney disease, and given her age.  I will follow her wounds as an outpatient and if they progress then I would arrange for CO2 arteriography.  Given that she does have peripheral vascular disease, I would only elevate her leg on one pillow and also only use mild compression therapy.  CELIAC AND SMA STENOSIS: I do not think that her symptoms fit with chronic mesenteric ischemia.  The patient has no postprandial abdominal pain.  She has stage III chronic kidney diseases and is not a good candidate for CT angiogram or arteriography.  I would not recommend an aggressive approach to her moderate mesenteric arterial disease unless she develops clear-cut symptoms of chronic mesenteric ischemia.  I have reviewed GIs note that they do feel she had some ischemic colitis to her colon, however this may be a more focal process.  Her duplex scan shows only moderately elevated velocities in the celiac axis and superior mesenteric artery.  Both vessels are patent.  On her noncontrast CT there is no significant calcific disease at the origin of these vessels to suggest a significant stenosis however given that the  study was done without contrast the study did not provide any other information regarding her mesenteric arterial occlusive disease.   I did discuss my thoughts and recommendations with the patient this morning.  I also called her daughter and reviewed all of this.  I have written for dressing changes.  I have arranged for an outpatient follow-up visit with me.  SUBJECTIVE:   Her wounds on the left leg do hurt. No significant abdominal pain.  PHYSICAL EXAM:   Vitals:   03/03/19 1215 03/03/19 2023 03/04/19 0552 03/04/19 0553  BP: 126/64 (!) 118/49  119/73  Pulse: 77 66  73  Resp: 14 20  20   Temp: 98 F (36.7 C) 98 F (36.7 C)  97.6 F (36.4 C)  TempSrc: Oral Oral  Oral  SpO2: 97% 96%  94%  Weight:   75.9 kg   Height:       Monophasic Doppler signals in both feet. The wounds on the left leg are unchanged.  LABS:   ARTERIAL DOPPLER STUDY: I have reviewed her arterial Doppler study which shows monophasic Doppler signals in both feet.  ABIs could not be obtained because of her dressings.  Toe pressure on the right was 38 mmHg and toe pressure on the left was 30 mmHg.  This would suggest marginal perfusion for healing.  Lab Results  Component Value Date   WBC 5.8 03/04/2019   HGB 7.6 (L) 03/04/2019   HCT 26.1 (L) 03/04/2019   MCV 83.1 03/04/2019   PLT 238 03/04/2019   Lab Results  Component Value Date   CREATININE  1.65 (H) 03/04/2019   Lab Results  Component Value Date   INR 1.2 02/28/2019    PROBLEM LIST:    Principal Problem:   Acute on chronic diastolic CHF (congestive heart failure), NYHA class 4 (HCC) Active Problems:   Permanent atrial fibrillation   COPD (chronic obstructive pulmonary disease) (HCC)   Hypertension   Chronic kidney disease (CKD), stage IV (severe) (HCC)   PAD (peripheral artery disease) (HCC)   S/p nephrectomy   Pulmonary hypertension (HCC)   Symptomatic anemia   AKI (acute kidney injury) ([place])   CURRENT MEDS:   . sodium chloride    Intravenous Once  . apixaban  2.5 mg Oral BID  . docusate sodium  100 mg Oral BID  . ezetimibe  10 mg Oral Daily  . fluticasone  2 spray Each Nare Daily  . furosemide  80 mg Intravenous BID  . hydrocortisone cream   Topical BID  . influenza vaccine adjuvanted  0.5 mL Intramuscular Tomorrow-1000  . levothyroxine  25 mcg Oral QAC breakfast  . lidocaine  1 patch Transdermal Q24H  . loratadine  10 mg Oral Daily  . magnesium oxide  400 mg Oral Daily  . potassium chloride  20 mEq Oral BID  . rosuvastatin  40 mg Oral Daily  . sodium chloride flush  3 mL Intravenous Once    Deitra Mayo Office: [phone] 03/04/2019

## 2019-03-04 NOTE — Progress Notes (Addendum)
Advanced Heart Failure Rounding Note  PCP-Cardiologist: Virl Axe, MD   Subjective:    Yesterday diuresed with IV lasix. Negative 1.1 liter.  Weight down 3 pounds.    Complaining of left leg pain.    Objective:   Weight Range: 75.9 kg Body mass index is 27 kg/m.   Vital Signs:   Temp:  [97.6 F (36.4 C)-98 F (36.7 C)] 97.6 F (36.4 C) (10/29 0553) Pulse Rate:  [66-77] 73 (10/29 0553) Resp:  [14-20] 20 (10/29 0553) BP: (118-126)/(49-73) 119/73 (10/29 0553) SpO2:  [94 %-97 %] 94 % (10/29 0553) Weight:  [75.9 kg] 75.9 kg (10/29 0552) Last BM Date: 03/02/19  Weight change: Filed Weights   03/02/19 0624 03/03/19 0635 03/04/19 0552  Weight: 75 kg 77.5 kg 75.9 kg    Intake/Output:   Intake/Output Summary (Last 24 hours) at 03/04/2019 1044 Last data filed at 03/04/2019 0841 Gross per 24 hour  Intake 840 ml  Output 1900 ml  Net -1060 ml      Physical Exam    General:  Sitting on the side of the bed.  No resp difficulty HEENT: normal Neck: supple. JVD 10-11 . Carotids 2+ bilat; no bruits. No lymphadenopathy or thryomegaly appreciated. Cor: PMI nondisplaced. Irregular rate & rhythm. No rubs, gallops or murmurs. Lungs: clear Abdomen: soft, nontender, nondistended. No hepatosplenomegaly. No bruits or masses. Good bowel sounds. Extremities: no cyanosis, clubbing, rash, edema. RLE unna bootl LLE with dry crust and serous exudated.  Neuro: alert & orientedx3, cranial nerves grossly intact. moves all 4 extremities w/o difficulty. Affect pleasant   Telemetry    Afib 70-80s personally reviewed.   EKG    N/a  Labs    CBC Recent Labs    03/03/19 0423 03/04/19 0524  WBC 5.8 5.8  HGB 7.6* 7.6*  HCT 26.0* 26.1*  MCV 82.8 83.1  PLT 237 240   Basic Metabolic Panel Recent Labs    03/02/19 0431 03/03/19 0423 03/04/19 0524  NA 133* 132* 133*  K 3.9 4.2 3.7  CL 94* 94* 94*  CO2 29 29 27   GLUCOSE 97 102* 130*  BUN 53* 50* 46*  CREATININE 1.77*  1.64* 1.65*  CALCIUM 8.5* 8.4* 8.4*  MG 2.5* 2.4  --    Liver Function Tests Recent Labs    03/02/19 0431 03/03/19 0423  AST 31 28  ALT 20 17  ALKPHOS 53 56  BILITOT 1.0 1.0  PROT 5.5* 5.7*  ALBUMIN 3.0* 3.0*   No results for input(s): LIPASE, AMYLASE in the last 72 hours. Cardiac Enzymes No results for input(s): CKTOTAL, CKMB, CKMBINDEX, TROPONINI in the last 72 hours.  BNP: BNP (last 3 results) Recent Labs    11/04/18 1715 01/20/19 1451 02/24/19 2051  BNP 369.3* 313.4* 314.7*    ProBNP (last 3 results) No results for input(s): PROBNP in the last 8760 hours.   D-Dimer No results for input(s): DDIMER in the last 72 hours. Hemoglobin A1C No results for input(s): HGBA1C in the last 72 hours. Fasting Lipid Panel No results for input(s): CHOL, HDL, LDLCALC, TRIG, CHOLHDL, LDLDIRECT in the last 72 hours. Thyroid Function Tests No results for input(s): TSH, T4TOTAL, T3FREE, THYROIDAB in the last 72 hours.  Invalid input(s): FREET3  Other results:   Imaging    Vas [place] Abi With/wo Tbi  Result Date: 03/03/2019 LOWER EXTREMITY DOPPLER STUDY Indications: Peripheral artery disease, and venous stasis.  Limitations: Today's exam was limited due to bandages , an open wound and  patient intolerant to cuff pressure. Comparison Study: No prior study on file for comparison Performing Technologist: Sharion Dove RVS  Examination Guidelines: A complete evaluation includes at minimum, Doppler waveform signals and systolic blood pressure reading at the level of bilateral brachial, anterior tibial, and posterior tibial arteries, when vessel segments are accessible. Bilateral testing is considered an integral part of a complete examination. Photoelectric Plethysmograph (PPG) waveforms and toe systolic pressure readings are included as required and additional duplex testing as needed. Limited examinations for reoccurring indications may be performed as noted.  ABI Findings:  +---------+------------------+-----+----------+--------+  Right     Rt Pressure (mmHg) Index Waveform   Comment   +---------+------------------+-----+----------+--------+  Brachial                           triphasic            +---------+------------------+-----+----------+--------+  PTA                                monophasic           +---------+------------------+-----+----------+--------+  DP                                 monophasic           +---------+------------------+-----+----------+--------+  Great Toe 38                 0.31                       +---------+------------------+-----+----------+--------+ +---------+------------------+-----+----------+-------+  Left      Lt Pressure (mmHg) Index Waveform   Comment  +---------+------------------+-----+----------+-------+  Brachial  121                      triphasic           +---------+------------------+-----+----------+-------+  PTA                                monophasic          +---------+------------------+-----+----------+-------+  DP                                 monophasic          +---------+------------------+-----+----------+-------+  Great Toe 30                 0.25                      +---------+------------------+-----+----------+-------+ Unable to get pressures. Waveforms and toe pressures are abnormal  Summary: Right: The right toe-brachial index is abnormal. Unable to get pressures. Waveforms and toe pressures are abnormal.  *See table(s) above for measurements and observations.  Electronically signed by Deitra Mayo MD on 03/03/2019 at 4:50:38 PM.   Final      Medications:     Scheduled Medications:  sodium chloride   Intravenous Once   apixaban  2.5 mg Oral BID   docusate sodium  100 mg Oral BID   ezetimibe  10 mg Oral Daily   fluticasone  2 spray Each Nare Daily   furosemide  80 mg Intravenous BID   hydrocortisone cream   Topical BID  influenza vaccine adjuvanted  0.5 mL Intramuscular  Tomorrow-1000   levothyroxine  25 mcg Oral QAC breakfast   lidocaine  1 patch Transdermal Q24H   loratadine  10 mg Oral Daily   magnesium oxide  400 mg Oral Daily   potassium chloride  20 mEq Oral BID   rosuvastatin  40 mg Oral Daily   sodium chloride flush  3 mL Intravenous Once    Infusions:  sodium chloride 1,000 mL (03/04/19 0957)    ceFAZolin (ANCEF) IV 0.5 g (03/04/19 0958)    PRN Medications: sodium chloride, acetaminophen, bisacodyl, fentaNYL (SUBLIMAZE) injection, ondansetron **OR** ondansetron (ZOFRAN) IV, oxyCODONE **OR** oxyCODONE    Patient Profile   85 y/o woman with h/o diastolic HF with R>L symptoms, permanent AF (CHADSVaSC = 8), CKD III-IV with solitary kidney admitted with symptomatic anemia, hgb 5.8 on admit requiring transfusion. GI w/u suggestive of possible ischemic bowel. Warfarin stopped and now on apixaban. Major issue now is significant RHF with 2-3+ edema and with weeping lesions. Has previously refused wraps due to LE pain.   Assessment/Plan   1. Symptomatic Anemia  Hgb 5.8 on admit s/p transfusion. Hgb increased to 8.3 but trending back down, 7.9>>7.7>>7.6   EGD/Colonoscopy showed mild gastritis,- Biopsy --> ischemic colitis.  -VVS evaluated---> Per Scot Dock " Her duplex scan shows only moderately elevated velocities in the celiac axis and superior mesenteric artery.  Both vessels are patent.  On her noncontrast CT there is no significant calcific disease at the origin of these vessels to suggest a significant stenosis however given that the study was done without contrast the study did not provide any other information regarding her mesenteric arterial occlusive disease"  -Remains on apixaban for afib. Continue to monitor H/H - Hgb unchanged 7.6   -Transfuse if Hgb drops < 7.0  2. A/C Diastolic HF  ECHO w/ EF 09-98% , RV normal . Has Cardiomems. R>L HF w/ significant LE edema.   PTA was on high dose torsemide 100 mg bid.  -Volume status  improving.  Continue lasix 80 mg twice a day.  - Renal function stable.  -Continue compression wrap on R. Intolerant unna boot on L.   3. AKI on CKD Stage III, solitary kidney Baseline creatinine 1.4-1.8  Creatinine on admit 1.7, bumped to 2 but trending back down to 1.6.   4. Cellulitis, LLE  Needs WOC assistance   Vascular following--> .Toe pressure was 38 mmHg on the right and 30 mmHg on the left.  This would suggest marginal perfusion for healing the leg wounds. Vascular will follow as an outpatient.   5. Hypothyroidism  TSH 3.9  6. Hyponatremia: NA 133 -Likely 2/2 hypervolemia -Fluid restrict    Length of Stay: 7  Amy Clegg, NP  03/04/2019, 10:44 AM  Advanced Heart Failure Team Pager [phone] (M-F; 7a - 4p)  Please contact [facility] for night-coverage after hours (4p -7a ) and weekends on [email or web address]  Patient seen with NP, agree with the above note.    Still dyspnea when she walks around the room.  Feet/lower legs still painful, especially on the left.  No abdominal pain.  She had a normal-appearing BM.   General: NAD Neck: JVP 12-13 cm, no thyromegaly or thyroid nodule.  Lungs: Decreased BS at bases.  CV: Nondisplaced PMI.  Heart regular S1/S2, no S3/S4, 2/6 SEM RUSB.  2+ edema to knees bilaterally.  Abdomen: Soft, nontender, no hepatosplenomegaly, no distention.  Skin: Intact without lesions or rashes.  Neurologic: Alert and oriented  x 3.  Psych: Normal affect. Extremities: Ulcers/ischemic changes left lower leg, right lower leg wrapped.   HEENT: Normal.   1. Acute on chronic diastolic CHF: Echo in 1/42 with EF 60-65% with mild RV dilation. She has a Cardiomems device.She had remote nonischemic cardiomyopathy with recovery of EF, has Medtronic ICD. Suspect that there is significant RV dysfunction. Atrial fibrillation likely contributes, but this appears to be permanent now as she has failed multiple anti-arrhythmics and is reasonably rate-controlled.  RHC7/6/20showed mild-moderately elevated PCWP and RA pressure, primarily pulmonary venous hypertension. Prominent v-waves on PCWP tracing may be due to stiff ventricle/diastolic dysfunction as she had only mild MR on recent echo.On exam today, she is still volume overloaded. Creatinine is back to near her baseline.  - Lasix 80 mg IV bid again today. Will give 1 dose of metolazone with pm Lasix.   - Unna boot on right, cannot tolerate on left.   2. AKI on CKD stage 3: Creatinine improved to 1.65, this is near her baseline.  3. Pulmonary hypertension: She has pulmonary venous hypertension.  4. Anemia: She has history of Fe deficiency anemia. Admitted with GI bleeding, colonoscopy suggestive of ischemic colitis (pathology confirmed). Has had transfusions. Also with diverticulosis. Hgb stable at 7.6, no further overt bleeding.  - Transfuse hgb < 7.  - GI following.  - Switched to apixaban 2.5 mg bid.  5. Atrial fibrillation: Permanent. Transitioned from warfarin to Eliquis with GI bleeding.  6. Cellulitis: Lower legs, on Ancef. 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  However, no "intestinal angina" reported.  Seen by Dr. Doren Custard, recommended conservative management unless she were to develop clear symptoms of chronic mesenteric ischemia.  - She is on Crestor.  8. PAD: Bilateral leg pain and ulcerations with chronic ischemic skin changes.  Peripheral arterial dopplers showed significant bilateral PAD.  Seen by Dr. Scot Dock, he was concerned that she may not be able to heal leg wounds.  If worsen, will need CO2 angiography.   [name] 03/04/2019 11:56 AM

## 2019-03-04 NOTE — Progress Notes (Signed)
PROGRESS NOTE  [name] UMP:[id number] DOB: [birth year] DOA: 02/24/2019 PCP: Elby Beck, FNP  Brief History   85 year old woman complicated past medical history presented with shortness of breath and lower extremity edema.  A & P  Acute on chronic diastolic CHF with associated hyponatremia.  Significant RV dysfunction suspected.  Atrial fibrillation likely contributes.  LVEF 60 to 65%. --Continue diuresis and treatment as per cardiology  Symptomatic anemia status post 3 units PRBC transfusion, secondary to ischemic colitis per GI although significance of this is somewhat questionable vascular surgery, it is felt to be a local process.  Likely source of iron deficiency anemia.  Seen by gastroenterology.  EGD and colonoscopy showed mild gastritis, H. pylori was negative, 2 patches of moderate inflammation of the colon concerning for ischemia.  Seen by vascular surgery, mesenteric ischemia not thought likely at this point. --Hemoglobin remained stable.  Continue apixaban..  Combined peripheral vascular disease and chronic venous insufficiency --Per vascular, plan is for outpatient follow-up of her wounds, elevate legs only on one pillow, mild compression therapy only. --If wounds worsen, CO2 angiography will be considered  Celiac and SMA stenosis, at this point not thought to be chronic mesenteric ischemia.  No postprandial abdominal pain.  No further treatment unless develops clear-cut symptoms of chronic mesenteric ischemia --Follow-up with vascular surgery as an outpatient  Left lower extremity cellulitis --Appears to be nearly resolved.  Change to oral antibiotics for 3 more days.  AKI superimposed on CKD stage III, solitary kidney --Creatinine stable with diuresis.  Monitor with BMP daily.  Atrial fibrillation --Paced rhythm on telemetry.  Continue apixaban.  Hypothyroidism.  TSH within normal limits. --Continue levothyroxine.   Resolved Hospital Problem list        DVT prophylaxis: apixaban Code Status: DNR Family Communication: daughter at bedside Disposition Plan: HHPT    Murray Hodgkins, MD  [facility] Direct contact: see [email or web address] (further directions at bottom of note if needed) 7PM-7AM contact night coverage as at bottom of note 03/04/2019, 4:06 PM  LOS: 7 days   Significant Hospital Events   .    Consults:  Marland Kitchen Vascular surgery . Cardiology   Procedures:  .   Significant Diagnostic Tests:  Marland Kitchen    Micro Data:  .    Antimicrobials:  .   Interval History/Subjective  Slept poorly.  Breathing okay.  Still has pain in left leg and swelling.  Objective   Vitals:  Vitals:   03/04/19 0553 03/04/19 1214  BP: 119/73 (!) 144/63  Pulse: 73 60  Resp: 20 15  Temp: 97.6 F (36.4 C) 97.7 F (36.5 C)  SpO2: 94% 91%    Exam:  Constitutional:   . Appears calm and comfortable sitting on side of the bed. Respiratory:  . CTA bilaterally, no w/r/r.  . Respiratory effort normal.  Cardiovascular:  . RRR, no m/r/g . Right lower extremity and Unna boot.  Left lower extremity still 2-3+ edema, hypertrophy of skin noted, mildly tender. Psychiatric:  . Mental status o Mood, affect appropriate  I have personally reviewed the following:   Today's Data  . Urine output 1900 . Creatinine stable 1.65.  BUN stable at 46. Marland Kitchen Hemoglobin stable 7.6.   Scheduled Meds: . apixaban  2.5 mg Oral BID  . docusate sodium  100 mg Oral BID  . ezetimibe  10 mg Oral Daily  . fluticasone  2 spray Each Nare Daily  . furosemide  80 mg Intravenous BID  . hydrocortisone cream  Topical BID  . influenza vaccine adjuvanted  0.5 mL Intramuscular Tomorrow-1000  . levothyroxine  25 mcg Oral QAC breakfast  . lidocaine  1 patch Transdermal Q24H  . loratadine  10 mg Oral Daily  . magnesium oxide  400 mg Oral Daily  . metolazone  2.5 mg Oral Once  . potassium chloride  20 mEq Oral BID  . rosuvastatin  40 mg Oral Daily  . sodium chloride flush   3 mL Intravenous Once   Continuous Infusions: . sodium chloride 1,000 mL (03/04/19 0957)  .  ceFAZolin (ANCEF) IV 0.5 g (03/04/19 0958)    Principal Problem:   Acute on chronic diastolic CHF (congestive heart failure), NYHA class 4 (HCC) Active Problems:   Permanent atrial fibrillation   COPD (chronic obstructive pulmonary disease) (HCC)   Hypertension   Chronic kidney disease (CKD), stage IV (severe) (HCC)   PAD (peripheral artery disease) (HCC)   S/p nephrectomy   Pulmonary hypertension (HCC)   Symptomatic anemia   AKI (acute kidney injury) ([place])   LOS: 7 days   How to contact the [facility] Attending or Consulting provider [place] or covering provider during after hours 7P -7A, for this patient?  1. Check the care team in [facility] and look for a) attending/consulting [facility] provider listed and b) the [facility] team listed 2. Log into [email or web address] and use [place] universal password to access. If you do not have the password, please contact the hospital operator. 3. Locate the [facility] provider you are looking for under [facility] and page to a number that you can be directly reached. 4. If you still have difficulty reaching the provider, please page the [facility] (Director on Call) for the Hospitalists listed on amion for assistance.

## 2019-03-04 NOTE — Progress Notes (Signed)
The biopsies from her colon support my endoscopic impression; ischemic colitis.  The changes were not severe endoscopically however they are likely the source of her IDA.  This is not something that can be treated endoscopically as it is an issue with poor vascular supply or generalized low flow to the colon.  Need to be very judicious about blood thinning medicines going forward and optimize blood flow to her colon.  Please call or page with any further questions or concerns.

## 2019-03-04 NOTE — Care Management Important Message (Signed)
Important Message  Patient Details  Name: [name] MRN: [id number] Date of Birth: 12-Feb-1934   Medicare Important Message Given:  Yes     Shelda Altes 03/04/2019, 3:18 PM

## 2019-03-05 ENCOUNTER — Encounter: Payer: PPO | Admitting: Vascular Surgery

## 2019-03-05 ENCOUNTER — Encounter (HOSPITAL_COMMUNITY): Payer: PPO

## 2019-03-05 DIAGNOSIS — I5033 Acute on chronic diastolic (congestive) heart failure: Secondary | ICD-10-CM | POA: Diagnosis not present

## 2019-03-05 LAB — BASIC METABOLIC PANEL
Anion gap: 10 (ref 5–15)
BUN: 42 mg/dL — ABNORMAL HIGH (ref 8–23)
CO2: 30 mmol/L (ref 22–32)
Calcium: 8.7 mg/dL — ABNORMAL LOW (ref 8.9–10.3)
Chloride: 96 mmol/L — ABNORMAL LOW (ref 98–111)
Creatinine, Ser: 1.44 mg/dL — ABNORMAL HIGH (ref 0.44–1.00)
GFR calc Af Amer: 39 mL/min — ABNORMAL LOW (ref >60)
GFR calc non Af Amer: 34 mL/min — ABNORMAL LOW (ref >60)
Glucose, Bld: 171 mg/dL — ABNORMAL HIGH (ref 70–99)
Potassium: 3.9 mmol/L (ref 3.5–5.1)
Sodium: 136 mmol/L (ref 135–145)

## 2019-03-05 LAB — CBC
HCT: 26.9 % — ABNORMAL LOW (ref 36.0–46.0)
Hemoglobin: 7.6 g/dL — ABNORMAL LOW (ref 12.0–15.0)
MCH: 23.8 pg — ABNORMAL LOW (ref 26.0–34.0)
MCHC: 28.3 g/dL — ABNORMAL LOW (ref 30.0–36.0)
MCV: 84.1 fL (ref 80.0–100.0)
Platelets: 268 K/uL (ref 150–400)
RBC: 3.2 MIL/uL — ABNORMAL LOW (ref 3.87–5.11)
RDW: 18.4 % — ABNORMAL HIGH (ref 11.5–15.5)
WBC: 5.1 K/uL (ref 4.0–10.5)
nRBC: 0.4 % — ABNORMAL HIGH (ref 0.0–0.2)

## 2019-03-05 LAB — MAGNESIUM: Magnesium: 2.4 mg/dL (ref 1.7–2.4)

## 2019-03-05 MED ORDER — ACETAMINOPHEN 325 MG PO TABS
650.0000 mg | ORAL_TABLET | Freq: Once | ORAL | Status: AC
  Administered 2019-03-05: 650 mg via ORAL
  Filled 2019-03-05: qty 2

## 2019-03-05 MED ORDER — METOLAZONE 2.5 MG PO TABS
2.5000 mg | ORAL_TABLET | Freq: Once | ORAL | Status: AC
  Administered 2019-03-05: 2.5 mg via ORAL
  Filled 2019-03-05: qty 1

## 2019-03-05 NOTE — Progress Notes (Signed)
Orthopedic Tech Progress Note Patient Details:  [name] [birth year] [id number]  Ortho Devices Type of Ortho Device: Haematologist Ortho Device/Splint Location: LRE Ortho Device/Splint Interventions: Adjustment, Application, Ordered   Post Interventions Patient Tolerated: Well Instructions Provided: Care of device, Adjustment of device   [name] 03/05/2019, 6:15 PM

## 2019-03-05 NOTE — Progress Notes (Signed)
PROGRESS NOTE    [name]  FBP:[id number] DOB: 1933/05/24 DOA: 02/24/2019 PCP: Elby Beck, FNP   Brief Narrative: 85 year old female with A. fib, diastolic CHF last EF 60 to 65% in June 2020, chronic anemia, hypothyroidism, chronic renal disease stage III with single functioning kidney presented to ER on 10/22 with increasing weakness, fall at home, leg swelling and shortness of breath. In the ER CT head unremarkable, chest x-ray mild bilateral pleural effusion EKG with A. fib, found to have significant bilateral lower extremity edema, hemoglobin 6.2 g, 22 PRBC transfusion ordered and admitted with IV Lasix for CHF and anemia  Subjective:  C/ left had hurting x2 wk off and on. Leg soreness +. No chest pain On RA. Sob not more than usual. But has more when she moves around Wt at 164 lb ( on admit 167 and was up to 170 on 10/28)- report she weighs around 145-150s- but not sure. Lives alone, family nearby and comes in all the time, uses  Kasandra Knudsen sometime, had broken hip in June.  Assessment & Plan:   Acute on chronic diastolic CHF exacerbation, EF 60 to 65%, suspected likely contributed by A. fib, anemia.  On high-dose torsemide 100 twice daily at home.  Volume status improving on IV Lasix , status post metolazone .  Dose improving, attempt to diurese with IV Lasix and 1 dose of metolazone per cardiology.  Monitor renal function, continue compression wrap on the right, intolerant Unna boot on the left.  Followed by cardiology and appreciate input.  Permanent atrial fibrillation-rate controlled on Eliquis.  Symptomatic anemia status post PRBC, secondary to ischemic colitis, GI input appreciated, EGD and colonoscopy showed mild gastritis H. pylori negative, 2 patches of moderate inflammation of the colon concerning for ischemia and seen by vascular surgery mesenteric ischemia not thought likely at this point.  Hemoglobin stable continue to monitor.  Patient is on Eliquis-dose  adjusted to 2.5 mg twice daily.  Was on warfarin for A. fib previously.  Combined  peripheral vascular disease and chronic venous insufficiency: Appreciate vascular surgery input ABI could not be obtained because of wounds and the dressing on the right leg if the wound worsens may need CO2 arteriography.  Continue on vascular surgery follow-up on the wounds and outpatient.  Continue her Eliquis and statin.  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 No further treatment unless develops clear-cut symptoms of chronic mesenteric ischemia as per vascular surgery and follow-up outpatient  Left lower extremity cellulitis/venous insufficiency see above.  Cont Keflex.  HLD- on crestor  Hypothyroidism: Continue Synthroid.  AKI on Chronic kidney disease, stage III a, solitary kidney/S/p nephrectomy- monitor renal fun and is near her baseline. Recent Labs  Lab 02/28/19 0730 03/01/19 0550 03/02/19 0431 03/03/19 0423 03/04/19 0524  BUN 58* 56* 53* 50* 46*  CREATININE 1.77* 2.01* 1.77* 1.64* 1.65*   Left hand pain- symptomatic management. Not releived w tylenol, add tramadol but she is reluctant. Reports back in June she needed narcan.  Body mass index is 26.55 kg/m.    DVT prophylaxis: eliquis Code Status: full Family Communication: plan of care discussed with patient at bedside. Disposition Plan: Remains inpatient pending clinical improvement inadequate stable heart failure continue to require IV diuretics.  Continue PT OT and has advised home health PT supervision/assistance- 24 hr.  Consultants: Vascular surgery, cardiology  Procedures:none  Microbiology: none  Antimicrobials: Anti-infectives (From admission, onward)   Start     Dose/Rate Route Frequency Ordered Stop   03/04/19  2200  cephALEXin (KEFLEX) capsule 500 mg     500 mg Oral Every 12 hours 03/04/19 1613 03/10/19 2159   03/01/19 1400  ceFAZolin (ANCEF) IVPB 1 g/50 mL premix  Status:  Discontinued     1 g 100 mL/hr  over 30 Minutes Intravenous Every 8 hours 03/01/19 1033 03/01/19 1043   03/01/19 1045  ceFAZolin (ANCEF) 0.5 g in dextrose 5 % 100 mL IVPB  Status:  Discontinued     0.5 g 210 mL/hr over 30 Minutes Intravenous Every 12 hours 03/01/19 1043 03/04/19 1613       Objective: Vitals:   03/04/19 1214 03/04/19 1941 03/04/19 1943 03/05/19 0351  BP: (!) 144/63 (!) 127/44 (!) 128/46 126/74  Pulse: 60 65 73 72  Resp: 15 20  20   Temp: 97.7 F (36.5 C) 99 F (37.2 C)  98.3 F (36.8 C)  TempSrc: Oral Oral  Oral  SpO2: 91% 100% 96% 92%  Weight:    74.6 kg  Height:        Intake/Output Summary (Last 24 hours) at 03/05/2019 0800 Last data filed at 03/05/2019 0759 Gross per 24 hour  Intake 960 ml  Output 1900 ml  Net -940 ml   Filed Weights   03/03/19 0635 03/04/19 0552 03/05/19 0351  Weight: 77.5 kg 75.9 kg 74.6 kg   Weight change: -1.27 kg  Body mass index is 26.55 kg/m.  Intake/Output from previous day: 10/29 0701 - 10/30 0700 In: 720 [P.O.:720] Out: 1300 [Urine:1300] Intake/Output this shift: Total I/O In: 240 [P.O.:240] Out: 600 [Urine:600]  Examination:  General exam: AAO,NAD, Weak appearing. HEENT:Oral mucosa moist, Ear/Nose WNL grossly, dentition normal. Respiratory system: Diminished b/l, no wheezing or crackles,no use of accessory muscle Cardiovascular system: S1 & S2 +, No JVD,. Gastrointestinal system: Abdomen soft, NT,ND, BS+ Nervous System:Alert, awake, moving extremities and grossly nonfocal Extremities: b/  LE edema- rt leg in wrap- left with sore/ulcers Skin: No rashes,no icterus. MSK: Normal muscle bulk,tone, power  Medications:  Scheduled Meds:  apixaban  2.5 mg Oral BID   cephALEXin  500 mg Oral Q12H   docusate sodium  100 mg Oral BID   ezetimibe  10 mg Oral Daily   fluticasone  2 spray Each Nare Daily   furosemide  80 mg Intravenous BID   hydrocortisone cream   Topical BID   influenza vaccine adjuvanted  0.5 mL Intramuscular Tomorrow-1000    levothyroxine  25 mcg Oral QAC breakfast   lidocaine  1 patch Transdermal Q24H   loratadine  10 mg Oral Daily   magnesium oxide  400 mg Oral Daily   potassium chloride  20 mEq Oral BID   rosuvastatin  40 mg Oral Daily   sodium chloride flush  3 mL Intravenous Once   Continuous Infusions:  sodium chloride 1,000 mL (03/04/19 0957)    Data Reviewed: I have personally reviewed following labs and imaging studies  CBC: Recent Labs  Lab 02/28/19 0730 03/01/19 0550 03/02/19 0431 03/03/19 0423 03/04/19 0524  WBC 8.8 6.8 7.3 5.8 5.8  HGB 8.3* 7.9* 7.7* 7.6* 7.6*  HCT 27.5* 26.5* 26.3* 26.0* 26.1*  MCV 83.1 83.9 84.0 82.8 83.1  PLT 267 259 246 237 785   Basic Metabolic Panel: Recent Labs  Lab 02/27/19 0443 02/28/19 0730 03/01/19 0550 03/02/19 0431 03/03/19 0423 03/04/19 0524  NA 135 136 136 133* 132* 133*  K 3.6 3.4* 3.5 3.9 4.2 3.7  CL 92* 92* 95* 94* 94* 94*  CO2 30 31 29  29  29 27  GLUCOSE 106* 104* 93 97 102* 130*  BUN 69* 58* 56* 53* 50* 46*  CREATININE 1.88* 1.77* 2.01* 1.77* 1.64* 1.65*  CALCIUM 8.5* 8.9 8.5* 8.5* 8.4* 8.4*  MG 2.3 2.4 2.5* 2.5* 2.4  --    GFR: Estimated Creatinine Clearance: 27.1 mL/min (A) (by C-G formula based on SCr of 1.65 mg/dL (H)). Liver Function Tests: Recent Labs  Lab 02/27/19 0443 02/28/19 0730 03/01/19 0550 03/02/19 0431 03/03/19 0423  AST 28 32 30 31 28   ALT 15 20 19 20 17   ALKPHOS 54 59 52 53 56  BILITOT 1.0 1.4* 0.7 1.0 1.0  PROT 5.9* 6.1* 5.6* 5.5* 5.7*  ALBUMIN 3.2* 3.4* 3.0* 3.0* 3.0*   No results for input(s): LIPASE, AMYLASE in the last 168 hours. No results for input(s): AMMONIA in the last 168 hours. Coagulation Profile: Recent Labs  Lab 02/27/19 0443 02/28/19 0730  INR 1.7* 1.2   Cardiac Enzymes: No results for input(s): CKTOTAL, CKMB, CKMBINDEX, TROPONINI in the last 168 hours. BNP (last 3 results) No results for input(s): PROBNP in the last 8760 hours. HbA1C: No results for input(s): HGBA1C  in the last 72 hours. CBG: No results for input(s): GLUCAP in the last 168 hours. Lipid Profile: No results for input(s): CHOL, HDL, LDLCALC, TRIG, CHOLHDL, LDLDIRECT in the last 72 hours. Thyroid Function Tests: No results for input(s): TSH, T4TOTAL, FREET4, T3FREE, THYROIDAB in the last 72 hours. Anemia Panel: No results for input(s): VITAMINB12, FOLATE, FERRITIN, TIBC, IRON, RETICCTPCT in the last 72 hours. Sepsis Labs: No results for input(s): PROCALCITON, LATICACIDVEN in the last 168 hours.  Recent Results (from the past 240 hour(s))  SARS CORONAVIRUS 2 (TAT 6-24 HRS) Nasopharyngeal Nasopharyngeal Swab     Status: None   Collection Time: 02/25/19 12:26 AM   Specimen: Nasopharyngeal Swab  Result Value Ref Range Status   SARS Coronavirus 2 NEGATIVE NEGATIVE Final    Comment: (NOTE) SARS-CoV-2 target nucleic acids are NOT DETECTED. The SARS-CoV-2 RNA is generally detectable in upper and lower respiratory specimens during the acute phase of infection. Negative results do not preclude SARS-CoV-2 infection, do not rule out co-infections with other pathogens, and should not be used as the sole basis for treatment or other patient management decisions. Negative results must be combined with clinical observations, patient history, and epidemiological information. The expected result is Negative. Fact Sheet for Patients: SugarRoll.be Fact Sheet for Healthcare Providers: https://www.woods-mathews.com/ This test is not yet approved or cleared by the [place] FDA and  has been authorized for detection and/or diagnosis of SARS-CoV-2 by FDA under an Emergency Use Authorization (EUA). This EUA will remain  in effect (meaning this test can be used) for the duration of the COVID-19 declaration under Section 56 4(b)(1) of the Act, 21 U.S.C. section 360bbb-3(b)(1), unless the authorization is terminated or revoked sooner. Performed at [facility], [place] [address] [place], [place] [address]       Radiology Studies: Vas [place] Abi With/wo Tbi  Result Date: 03/03/2019 LOWER EXTREMITY DOPPLER STUDY Indications: Peripheral artery disease, and venous stasis.  Limitations: Today's exam was limited due to bandages , an open wound and              patient intolerant to cuff pressure. Comparison Study: No prior study on file for comparison Performing Technologist: Sharion Dove RVS  Examination Guidelines: A complete evaluation includes at minimum, Doppler waveform signals and systolic blood pressure reading at the level of bilateral brachial, anterior  tibial, and posterior tibial arteries, when vessel segments are accessible. Bilateral testing is considered an integral part of a complete examination. Photoelectric Plethysmograph (PPG) waveforms and toe systolic pressure readings are included as required and additional duplex testing as needed. Limited examinations for reoccurring indications may be performed as noted.  ABI Findings: +---------+------------------+-----+----------+--------+  Right     Rt Pressure (mmHg) Index Waveform   Comment   +---------+------------------+-----+----------+--------+  Brachial                           triphasic            +---------+------------------+-----+----------+--------+  PTA                                monophasic           +---------+------------------+-----+----------+--------+  DP                                 monophasic           +---------+------------------+-----+----------+--------+  Great Toe 38                 0.31                       +---------+------------------+-----+----------+--------+ +---------+------------------+-----+----------+-------+  Left      Lt Pressure (mmHg) Index Waveform   Comment  +---------+------------------+-----+----------+-------+  Brachial  121                      triphasic           +---------+------------------+-----+----------+-------+  PTA                                 monophasic          +---------+------------------+-----+----------+-------+  DP                                 monophasic          +---------+------------------+-----+----------+-------+  Great Toe 30                 0.25                      +---------+------------------+-----+----------+-------+ Unable to get pressures. Waveforms and toe pressures are abnormal  Summary: Right: The right toe-brachial index is abnormal. Unable to get pressures. Waveforms and toe pressures are abnormal.  *See table(s) above for measurements and observations.  Electronically signed by Deitra Mayo MD on 03/03/2019 at 4:50:38 PM.   Final       LOS: 8 days   Time spent: More than 50% of that time was spent in counseling and/or coordination of care.  Antonieta Pert, MD [facility]  03/05/2019, 8:00 AM

## 2019-03-05 NOTE — Progress Notes (Signed)
Physical Therapy Treatment Patient Details Name: [name] MRN: [id number] DOB: June 03, 1933 Today's Date: 03/05/2019    History of Present Illness 85 y.o. female with history of A. fib, diastolic CHF last EF measured was 60 to 35% in June 2020, chronic anemia, CKD III with single functioning kidney, presents to the ER 02/24/19 with weakness, fall, SOB and LE edema. Worked up for anemia and CHF.    PT Comments    Pt demonstrates slight improvements in ambulation quality during session, although activity tolerance remains limited by LLE pain as well as muscular fatigue. Pt reporting her ankles feel as if they get tired and will give way. Pt will benefit from continued acute PT services to improve LE strength and endurance, and restore independence in mobility.   Follow Up Recommendations  Home health PT;Supervision/Assistance - 24 hour     Equipment Recommendations  None recommended by PT    Recommendations for Other Services       Precautions / Restrictions Precautions Precautions: Fall Restrictions Weight Bearing Restrictions: No    Mobility  Bed Mobility Overal bed mobility: Modified Independent             General bed mobility comments: pt performs sidelying to sit modI with use of bedrail  Transfers Overall transfer level: Needs assistance Equipment used: Rolling walker (2 wheeled) Transfers: Sit to/from Stand Sit to Stand: Modified independent (Device/Increase time)            Ambulation/Gait Ambulation/Gait assistance: Supervision Gait Distance (Feet): 80 Feet Assistive device: Rolling walker (2 wheeled) Gait Pattern/deviations: Step-through pattern;Decreased stride length Gait velocity: Decreased Gait velocity interpretation: 1.31 - 2.62 ft/sec, indicative of limited community ambulator General Gait Details: slow step through gait with reduced stride length. Pt with gradual slowing of gait as distances increases   Stairs              Wheelchair Mobility    Modified Rankin (Stroke Patients Only)       Balance Overall balance assessment: Needs assistance Sitting-balance support: No upper extremity supported;Feet supported Sitting balance-Leahy Scale: Good Sitting balance - Comments: Independent   Standing balance support: Bilateral upper extremity supported Standing balance-Leahy Scale: Good Standing balance comment: supervision                            Cognition Arousal/Alertness: Awake/alert Behavior During Therapy: WFL for tasks assessed/performed Overall Cognitive Status: Within Functional Limits for tasks assessed                                        Exercises      General Comments        Pertinent Vitals/Pain Pain Assessment: Faces Faces Pain Scale: Hurts even more Pain Location: LLE Pain Descriptors / Indicators: Aching Pain Intervention(s): Limited activity within patient's tolerance    Home Living                      Prior Function            PT Goals (current goals can now be found in the care plan section) Acute Rehab PT Goals Patient Stated Goal: To regain strength and improve mobility Progress towards PT goals: Progressing toward goals    Frequency    Min 3X/week      PT Plan Current plan remains appropriate  Co-evaluation              AM-PAC PT "6 Clicks" Mobility   Outcome Measure  Help needed turning from your back to your side while in a flat bed without using bedrails?: None Help needed moving from lying on your back to sitting on the side of a flat bed without using bedrails?: None Help needed moving to and from a bed to a chair (including a wheelchair)?: None Help needed standing up from a chair using your arms (e.g., wheelchair or bedside chair)?: None Help needed to walk in hospital room?: A Little Help needed climbing 3-5 steps with a railing? : A Little 6 Click Score: 22    End of Session  Equipment Utilized During Treatment: Gait belt Activity Tolerance: Patient tolerated treatment well Patient left: in bed;with call bell/phone within reach Nurse Communication: Mobility status PT Visit Diagnosis: Other abnormalities of gait and mobility (R26.89)     Time: 7588-3254 PT Time Calculation (min) (ACUTE ONLY): 11 min  Charges:  $Gait Training: 8-22 mins                     [name], PT, DPT Acute Rehabilitation Pager: [phone]    [name] 03/05/2019, 12:18 PM

## 2019-03-05 NOTE — Plan of Care (Signed)
Patient's knowledge of her disease process is progressing. Will continue to monitor for educational needs.

## 2019-03-05 NOTE — Progress Notes (Signed)
Patient Iv infiltrated while flushing, patient did not get her IV lasix dose for 1800. IV team consult put it, awaiting for IV team to get the patient a new IV access.

## 2019-03-05 NOTE — Progress Notes (Signed)
Orthopedic Tech Progress Note Patient Details:  [name] 01-Nov-1933 [id number] Told RN that I could reapply unna boot to right leg once she removes the old one and clean leg. Patient ID: [name], female   DOB: 1934-03-06, 85 y.o.   MRN: [id number]   [name] 03/05/2019, 4:08 PM

## 2019-03-05 NOTE — Progress Notes (Addendum)
Advanced Heart Failure Rounding Note  PCP-Cardiologist: Virl Axe, MD   Subjective:    Feels more SOB this AM. Pulse Ox 95% on RA. Remains anemic but Hgb stable at 7.6.   Main complaint is her chronic left leg pain.   Got a dose of metolazone last night w/ IV Lasix dose. Only 1.8L in UOP documented yesterday but pt notes subjective improvement w/ increased urination w/ metolazone. Wt down 3 lb from 167>>164. SCr improving w/ diuresis, 1.65>>1.44. K 3.9.   Complaining of left leg pain.    Objective:   Weight Range: 74.6 kg Body mass index is 26.55 kg/m.   Vital Signs:   Temp:  [97.7 F (36.5 C)-99 F (37.2 C)] 98.3 F (36.8 C) (10/30 0351) Pulse Rate:  [60-73] 72 (10/30 0351) Resp:  [15-20] 20 (10/30 0351) BP: (126-144)/(44-74) 126/74 (10/30 0351) SpO2:  [91 %-100 %] 92 % (10/30 0351) Weight:  [74.6 kg] 74.6 kg (10/30 0351) Last BM Date: 03/04/19  Weight change: Filed Weights   03/03/19 0635 03/04/19 0552 03/05/19 0351  Weight: 77.5 kg 75.9 kg 74.6 kg    Intake/Output:   Intake/Output Summary (Last 24 hours) at 03/05/2019 0951 Last data filed at 03/05/2019 0759 Gross per 24 hour  Intake 600 ml  Output 1900 ml  Net -1300 ml      Physical Exam    PHYSICAL EXAM: General:  Elderly WF. Breathing slightly labored (was bathing at beside, respirations improved w/ rest) HEENT: normal Neck: supple. elevated JVD. Carotids 2+ bilat; no bruits. No lymphadenopathy or thyromegaly appreciated. Cor: PMI nondisplaced. Irregular rhythm. 2/6 SM at RUSB and LUSB Lungs: slightly decreased BS on the right base, faint crackles on the left Abdomen: soft, nontender, nondistended. No hepatosplenomegaly. No bruits or masses. Good bowel sounds. Extremities: no cyanosis, clubbing. chronic bilateral 3+ LEE w/ chronic venous stasis dermatitis and crusting on the left. Unna boot on the right LE  Neuro: alert & oriented x 3, cranial nerves grossly intact. moves all 4 extremities w/o  difficulty. Affect pleasant.  Telemetry    Afib 8 0s personally reviewed.   EKG    N/a  Labs    CBC Recent Labs    03/04/19 0524 03/05/19 0902  WBC 5.8 5.1  HGB 7.6* 7.6*  HCT 26.1* 26.9*  MCV 83.1 84.1  PLT 238 951   Basic Metabolic Panel Recent Labs    03/03/19 0423 03/04/19 0524 03/05/19 0902  NA 132* 133* 136  K 4.2 3.7 3.9  CL 94* 94* 96*  CO2 29 27 30   GLUCOSE 102* 130* 171*  BUN 50* 46* 42*  CREATININE 1.64* 1.65* 1.44*  CALCIUM 8.4* 8.4* 8.7*  MG 2.4  --  2.4   Liver Function Tests Recent Labs    03/03/19 0423  AST 28  ALT 17  ALKPHOS 56  BILITOT 1.0  PROT 5.7*  ALBUMIN 3.0*   No results for input(s): LIPASE, AMYLASE in the last 72 hours. Cardiac Enzymes No results for input(s): CKTOTAL, CKMB, CKMBINDEX, TROPONINI in the last 72 hours.  BNP: BNP (last 3 results) Recent Labs    11/04/18 1715 01/20/19 1451 02/24/19 2051  BNP 369.3* 313.4* 314.7*    ProBNP (last 3 results) No results for input(s): PROBNP in the last 8760 hours.   D-Dimer No results for input(s): DDIMER in the last 72 hours. Hemoglobin A1C No results for input(s): HGBA1C in the last 72 hours. Fasting Lipid Panel No results for input(s): CHOL, HDL, LDLCALC, TRIG, CHOLHDL,  LDLDIRECT in the last 72 hours. Thyroid Function Tests No results for input(s): TSH, T4TOTAL, T3FREE, THYROIDAB in the last 72 hours.  Invalid input(s): FREET3  Other results:   Imaging    No results found.   Medications:     Scheduled Medications:  apixaban  2.5 mg Oral BID   cephALEXin  500 mg Oral Q12H   docusate sodium  100 mg Oral BID   ezetimibe  10 mg Oral Daily   fluticasone  2 spray Each Nare Daily   furosemide  80 mg Intravenous BID   hydrocortisone cream   Topical BID   influenza vaccine adjuvanted  0.5 mL Intramuscular Tomorrow-1000   levothyroxine  25 mcg Oral QAC breakfast   lidocaine  1 patch Transdermal Q24H   loratadine  10 mg Oral Daily   magnesium  oxide  400 mg Oral Daily   potassium chloride  20 mEq Oral BID   rosuvastatin  40 mg Oral Daily   sodium chloride flush  3 mL Intravenous Once    Infusions:  sodium chloride 1,000 mL (03/04/19 0957)    PRN Medications: sodium chloride, acetaminophen, bisacodyl, fentaNYL (SUBLIMAZE) injection, ondansetron **OR** ondansetron (ZOFRAN) IV, oxyCODONE **OR** oxyCODONE    Patient Profile   85 y/o woman with h/o diastolic HF with R>L symptoms, permanent AF (CHADSVaSC = 8), CKD III-IV with solitary kidney admitted with symptomatic anemia, hgb 5.8 on admit requiring transfusion. GI w/u suggestive of possible ischemic bowel. Warfarin stopped and now on apixaban. Major issue now is significant RHF with 2-3+ edema and with weeping lesions. Has previously refused wraps due to LE pain.   Assessment/Plan   1. Symptomatic Anemia  Hgb 5.8 on admit s/p transfusion. Hgb increased to 8.3 but trending back down, 7.9>>7.7>>7.6   EGD/Colonoscopy showed mild gastritis,- Biopsy --> ischemic colitis.  -VVS evaluated---> Per Scot Dock " Her duplex scan shows only moderately elevated velocities in the celiac axis and superior mesenteric artery.  Both vessels are patent.  On her noncontrast CT there is no significant calcific disease at the origin of these vessels to suggest a significant stenosis however given that the study was done without contrast the study did not provide any other information regarding her mesenteric arterial occlusive disease"  -Remains on apixaban for afib. Continue to monitor H/H - Hgb unchanged 7.6   -Transfuse if Hgb drops < 7.0  2. A/C Diastolic HF  ECHO w/ EF 01-09% , RV normal . Has Cardiomems. R>L HF w/ significant LE edema.   PTA was on high dose torsemide 100 mg bid.  -Remains volume overloaded, but wt trending down, 167>>164. Diuresis a bit sluggish, only 1.8L out yesterday after 80 of IV lasix bid + dose of metolazone 2.5. Scr improving w/ diuresis, down from 1.65>>1.44. BP  stable. May need to further increase lasix to improve diuresis. Can give another dose of metolazone 2.5.  -Continue to monitor renal function and electrolytes, strict I/Os and daily wts.  -Continue compression wrap on R. Intolerant unna boot on L.   3. AKI on CKD Stage III, solitary kidney Baseline creatinine 1.4-1.8  -Creatinine on admit 1.7, bumped to 2 but trending back down to 1.6>>1.4.  -Monitor w/ diuresis   4. Cellulitis, LLE  Needs WOC assistance   Vascular following--> .Toe pressure was 38 mmHg on the right and 30 mmHg on the left.  This would suggest marginal perfusion for healing the leg wounds. Vascular will follow as an outpatient.   5. Hypothyroidism  TSH 3.9  6. Hyponatremia: improved, NA 133>>136 -Likely 2/2 hypervolemia -Fluid restrict    Length of Stay: [address], PA-C  03/05/2019, 9:51 AM  Advanced Heart Failure Team Pager [phone] (M-F; [place])  Please contact [facility] for night-coverage after hours (4p -7a ) and weekends on [email or web address]  Patient seen with PA, agree with the above note.  I/Os not fully recorded but weight is down about 3 lbs.  Creatinine down to 1.44.  Still with significant peripheral edema.   General: NAD Neck: JVP 14+ cm, no thyromegaly or thyroid nodule.  Lungs: Clear to auscultation bilaterally with normal respiratory effort. CV: Nondisplaced PMI.  Heart regular S1/S2, no S3/S4, 2/6 SEM RUSB.  2+ edema to thighs.   Abdomen: Soft, nontender, no hepatosplenomegaly, no distention.  Skin: Venous stasis ulcerations left lower leg, right lower leg wrapped.  Neurologic: Alert and oriented x 3.  Psych: Normal affect. Extremities: No clubbing or cyanosis.  HEENT: Normal.   1. Acute on chronic diastolic CHF: Echo in 5/32 with EF 60-65% with mild RV dilation. She has a Cardiomems device.She had remote nonischemic cardiomyopathy with recovery of EF, has [employer] ICD. Suspect that there is significant RV dysfunction.  Atrial fibrillation likely contributes, but this appears to be permanent now as she has failed multiple anti-arrhythmics and is reasonably rate-controlled. RHC7/6/20showed mild-moderately elevated PCWP and RA pressure, primarily pulmonary venous hypertension. Prominent v-waves on PCWP tracing may be due to stiff ventricle/diastolic dysfunction as she had only mild MR on recent echo.On exam today, she isstillvolume overloaded. Creatinine is back to near her baseline.  She responded well to metolazone yesterday.  - Lasix 80 mg IV bidagaintoday. Will give 1 dose of metolazone with pm Lasix again today.   - Unna booton right, cannot tolerate on left.  2. AKI on CKD stage 3: Creatinine improved to 1.44, this is near her baseline.  3. Pulmonary hypertension: She has pulmonary venous hypertension.  4. Anemia: She has history of Fe deficiency anemia. Admitted with GI bleeding, colonoscopy suggestive of ischemic colitis (pathology confirmed). Has had transfusions. Also with diverticulosis. Hgb stable at 7.6, no further overt bleeding.  - Transfuse hgb < 7.  - GI following.  - Switched to apixaban 2.5 mg bid.  5. Atrial fibrillation: Permanent. Transitioned from warfarin to Eliquis with GI bleeding.  6. Cellulitis: Lower legs, on Keflex. 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 However, no "intestinal angina" reported. Seen by Dr. Doren Custard, recommended conservative management unless she were to develop clear symptoms of chronic mesenteric ischemia.  - She is on Crestor.  8. PAD: Bilateral leg pain and ulcerations with chronic ischemic skin changes.  Peripheral arterial dopplers showed significant bilateral PAD.  Seen by Dr. Scot Dock, he was concerned that she may not be able to heal leg wounds.  If worsen, will need CO2 angiography.   Loralie Champagne 03/05/2019 1:09 PM

## 2019-03-06 DIAGNOSIS — I5033 Acute on chronic diastolic (congestive) heart failure: Secondary | ICD-10-CM | POA: Diagnosis not present

## 2019-03-06 LAB — CBC
HCT: 27.4 % — ABNORMAL LOW (ref 36.0–46.0)
Hemoglobin: 7.8 g/dL — ABNORMAL LOW (ref 12.0–15.0)
MCH: 23.6 pg — ABNORMAL LOW (ref 26.0–34.0)
MCHC: 28.5 g/dL — ABNORMAL LOW (ref 30.0–36.0)
MCV: 82.8 fL (ref 80.0–100.0)
Platelets: 281 10*3/uL (ref 150–400)
RBC: 3.31 MIL/uL — ABNORMAL LOW (ref 3.87–5.11)
RDW: 18.5 % — ABNORMAL HIGH (ref 11.5–15.5)
WBC: 6.3 10*3/uL (ref 4.0–10.5)
nRBC: 0 % (ref 0.0–0.2)

## 2019-03-06 LAB — BASIC METABOLIC PANEL
Anion gap: 11 (ref 5–15)
BUN: 41 mg/dL — ABNORMAL HIGH (ref 8–23)
CO2: 31 mmol/L (ref 22–32)
Calcium: 8.9 mg/dL (ref 8.9–10.3)
Chloride: 93 mmol/L — ABNORMAL LOW (ref 98–111)
Creatinine, Ser: 1.42 mg/dL — ABNORMAL HIGH (ref 0.44–1.00)
GFR calc Af Amer: 40 mL/min — ABNORMAL LOW (ref >60)
GFR calc non Af Amer: 34 mL/min — ABNORMAL LOW (ref >60)
Glucose, Bld: 98 mg/dL (ref 70–99)
Potassium: 4.2 mmol/L (ref 3.5–5.1)
Sodium: 135 mmol/L (ref 135–145)

## 2019-03-06 MED ORDER — METOLAZONE 2.5 MG PO TABS
2.5000 mg | ORAL_TABLET | Freq: Once | ORAL | Status: AC
  Administered 2019-03-06: 2.5 mg via ORAL
  Filled 2019-03-06: qty 1

## 2019-03-06 MED ORDER — SILVER SULFADIAZINE 1 % EX CREA
TOPICAL_CREAM | Freq: Every day | CUTANEOUS | Status: DC
  Administered 2019-03-06: 19:00:00 via TOPICAL
  Filled 2019-03-06: qty 85

## 2019-03-06 MED ORDER — FUROSEMIDE 10 MG/ML IJ SOLN
15.0000 mg/h | INTRAVENOUS | Status: DC
  Administered 2019-03-06 – 2019-03-09 (×3): 12 mg/h via INTRAVENOUS
  Administered 2019-03-09: 15 mg/h via INTRAVENOUS
  Filled 2019-03-06: qty 25
  Filled 2019-03-06: qty 21
  Filled 2019-03-06 (×3): qty 25
  Filled 2019-03-06: qty 21

## 2019-03-06 MED ORDER — FUROSEMIDE 10 MG/ML IJ SOLN
80.0000 mg | Freq: Once | INTRAMUSCULAR | Status: AC
  Administered 2019-03-06: 80 mg via INTRAVENOUS
  Filled 2019-03-06: qty 8

## 2019-03-06 NOTE — Progress Notes (Signed)
Pt continues to refuse wound care from nursing staff, stating it is too painful.

## 2019-03-06 NOTE — Progress Notes (Signed)
PROGRESS NOTE    [name]  BHA:[id number] DOB: March 25, 1934 DOA: 02/24/2019 PCP: Elby Beck, FNP   Brief Narrative: 85 year old female with A. fib, diastolic CHF last EF 60 to 65% in June 2020, chronic anemia, hypothyroidism, chronic renal disease stage III with single functioning kidney presented to ER on 10/22 with increasing weakness, fall at home, leg swelling and shortness of breath. In the ER CT head unremarkable, chest x-ray mild bilateral pleural effusion EKG with A. fib, found to have significant bilateral lower extremity edema, hemoglobin 6.2 g, 22 PRBC transfusion ordered and admitted with IV Lasix for CHF and anemia  Subjective:  C/o b/l feet pain- una boot changed yesterday. Overall leg swelling better/improving On RA. Voided 2 l  /24 hrs-but her weight is overall unchanged. breathing overall stable. On RA. Sob not more than usual. But has more when she moves around Wt at 164 lb<-164.5(on admit 167 and was up to 170 on 10/28)- report she weighs around 145-150s- but not sure. Lives alone, family nearby and comes in all the time, uses  Kasandra Knudsen sometime, had broken hip in June.  Assessment & Plan:   Acute on chronic diastolic CHF exacerbation, EF 60 to 65%, suspected likely contributed by A. fib, anemia.  On high-dose torsemide 100 twice daily at home.  Volume status improving on IV Lasix , status post metolazone x2  UOP 2l/24 hrs. Leg still swollen, creat stable and improving w diuresis. Her wt overall same as yesterday, will benefit with more diuresis-agree with IV Lasix drip and metolazone x1. Continue compression wrap on the right, intolerant Unna boot on the left.  Followed by cardiology and appreciate input.  Permanent atrial fibrillation-rate controlled on Eliquis.  Symptomatic anemia status post PRBC, secondary to ischemic colitis, GI input appreciated, EGD and colonoscopy showed mild gastritis H. pylori negative, 2 patches of moderate inflammation of the  colon concerning for ischemia and seen by vascular surgery mesenteric ischemia not thought likely at this point.  Hemoglobin stable continue to monitor.  Patient is on Eliquis-dose adjusted to 2.5 mg twice daily.  Was on warfarin for A. fib previously.  Combined  peripheral vascular disease and chronic venous insufficiency: Appreciate vascular surgery input ABI could not be obtained because of wounds and the dressing on the right leg if the wound worsens may need CO2 arteriography.  Continue on vascular surgery follow-up on the wounds and outpatient.  Continue her Eliquis and statin.  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 No further treatment unless develops clear-cut symptoms of chronic mesenteric ischemia as per vascular surgery and follow-up outpatient  Left lower extremity cellulitis/venous insufficiency see above.  Cont Keflex.  HLD-on crestor  Hypothyroidism: Continue Synthroid.  AKI on Chronic kidney disease, stage III a, solitary kidney/S/p nephrectomy- monitor renal fun and is near her baseline. Recent Labs  Lab 03/02/19 0431 03/03/19 0423 03/04/19 0524 03/05/19 0902 03/06/19 0545  BUN 53* 50* 46* 42* 41*  CREATININE 1.77* 1.64* 1.65* 1.44* 1.42*   Left hand pain- symptomatic management. Not releived w tylenol, add tramadol but she is reluctant. Reports back in June she needed narcan.  Body mass index is 26.47 kg/m.    DVT prophylaxis: eliquis Code Status: full Family Communication: plan of care discussed with patient at bedside. Disposition Plan: Remains inpatient pending clinical improvement inadequate stable heart failure continue to require IV diuretics.  Continue PT OT and has advised home health PT supervision/assistance- 24 hr.  Consultants: Vascular surgery, cardiology  Procedures:none  Microbiology: none  Antimicrobials: Anti-infectives (From admission, onward)   Start     Dose/Rate Route Frequency Ordered Stop   03/04/19 2200  cephALEXin  (KEFLEX) capsule 500 mg     500 mg Oral Every 12 hours 03/04/19 1613 03/10/19 2159   03/01/19 1400  ceFAZolin (ANCEF) IVPB 1 g/50 mL premix  Status:  Discontinued     1 g 100 mL/hr over 30 Minutes Intravenous Every 8 hours 03/01/19 1033 03/01/19 1043   03/01/19 1045  ceFAZolin (ANCEF) 0.5 g in dextrose 5 % 100 mL IVPB  Status:  Discontinued     0.5 g 210 mL/hr over 30 Minutes Intravenous Every 12 hours 03/01/19 1043 03/04/19 1613       Objective: Vitals:   03/05/19 1217 03/05/19 2035 03/06/19 0249 03/06/19 0353  BP: (!) 142/69 (!) 128/58  (!) 113/46  Pulse: 62 72  68  Resp: 20 17  17   Temp: (!) 97.5 F (36.4 C) 97.9 F (36.6 C)  97.6 F (36.4 C)  TempSrc: Oral Oral  Oral  SpO2: 100% 96%  96%  Weight:   74.4 kg   Height:        Intake/Output Summary (Last 24 hours) at 03/06/2019 0907 Last data filed at 03/06/2019 0503 Gross per 24 hour  Intake 720 ml  Output 1800 ml  Net -1080 ml   Filed Weights   03/04/19 0552 03/05/19 0351 03/06/19 0249  Weight: 75.9 kg 74.6 kg 74.4 kg   Weight change: -0.227 kg  Body mass index is 26.47 kg/m.  Intake/Output from previous day: 10/30 0701 - 10/31 0700 In: 960 [P.O.:960] Out: 2400 [Urine:2400] Intake/Output this shift: No intake/output data recorded.  Examination:  General exam: AAO,NAD, Weak, on RA HEENT:Oral mucosa moist, Ear/Nose WNL grossly, dentition normal. Respiratory system: Diminished b/l, no wheezing or crackles,no use of accessory muscle Cardiovascular system: S1 & S2 +, No JVD,. Gastrointestinal system: Abdomen soft, NT,ND, BS+ Nervous System:Alert, awake, moving extremities and grossly nonfocal Extremities: b/  LE edema W UNA BOOT on rt, extensive leg edema+ Skin: No rashes,no icterus. MSK: Normal muscle bulk,tone, power  Medications:  Scheduled Meds: . apixaban  2.5 mg Oral BID  . cephALEXin  500 mg Oral Q12H  . docusate sodium  100 mg Oral BID  . ezetimibe  10 mg Oral Daily  . fluticasone  2 spray  Each Nare Daily  . furosemide  80 mg Intravenous BID  . hydrocortisone cream   Topical BID  . influenza vaccine adjuvanted  0.5 mL Intramuscular Tomorrow-1000  . levothyroxine  25 mcg Oral QAC breakfast  . lidocaine  1 patch Transdermal Q24H  . loratadine  10 mg Oral Daily  . magnesium oxide  400 mg Oral Daily  . potassium chloride  20 mEq Oral BID  . rosuvastatin  40 mg Oral Daily  . sodium chloride flush  3 mL Intravenous Once   Continuous Infusions: . sodium chloride 1,000 mL (03/04/19 0957)    Data Reviewed: I have personally reviewed following labs and imaging studies  CBC: Recent Labs  Lab 03/02/19 0431 03/03/19 0423 03/04/19 0524 03/05/19 0902 03/06/19 0545  WBC 7.3 5.8 5.8 5.1 6.3  HGB 7.7* 7.6* 7.6* 7.6* 7.8*  HCT 26.3* 26.0* 26.1* 26.9* 27.4*  MCV 84.0 82.8 83.1 84.1 82.8  PLT 246 237 238 268 315   Basic Metabolic Panel: Recent Labs  Lab 02/28/19 0730 03/01/19 0550 03/02/19 0431 03/03/19 0423 03/04/19 0524 03/05/19 0902 03/06/19 0545  NA 136 136 133* 132* 133* 136 135  K 3.4* 3.5 3.9 4.2 3.7 3.9 4.2  CL 92* 95* 94* 94* 94* 96* 93*  CO2 31 29 29 29 27 30 31   GLUCOSE 104* 93 97 102* 130* 171* 98  BUN 58* 56* 53* 50* 46* 42* 41*  CREATININE 1.77* 2.01* 1.77* 1.64* 1.65* 1.44* 1.42*  CALCIUM 8.9 8.5* 8.5* 8.4* 8.4* 8.7* 8.9  MG 2.4 2.5* 2.5* 2.4  --  2.4  --    GFR: Estimated Creatinine Clearance: 31.5 mL/min (A) (by C-G formula based on SCr of 1.42 mg/dL (H)). Liver Function Tests: Recent Labs  Lab 02/28/19 0730 03/01/19 0550 03/02/19 0431 03/03/19 0423  AST 32 30 31 28   ALT 20 19 20 17   ALKPHOS 59 52 53 56  BILITOT 1.4* 0.7 1.0 1.0  PROT 6.1* 5.6* 5.5* 5.7*  ALBUMIN 3.4* 3.0* 3.0* 3.0*   No results for input(s): LIPASE, AMYLASE in the last 168 hours. No results for input(s): AMMONIA in the last 168 hours. Coagulation Profile: Recent Labs  Lab 02/28/19 0730  INR 1.2   Cardiac Enzymes: No results for input(s): CKTOTAL, CKMB,  CKMBINDEX, TROPONINI in the last 168 hours. BNP (last 3 results) No results for input(s): PROBNP in the last 8760 hours. HbA1C: No results for input(s): HGBA1C in the last 72 hours. CBG: No results for input(s): GLUCAP in the last 168 hours. Lipid Profile: No results for input(s): CHOL, HDL, LDLCALC, TRIG, CHOLHDL, LDLDIRECT in the last 72 hours. Thyroid Function Tests: No results for input(s): TSH, T4TOTAL, FREET4, T3FREE, THYROIDAB in the last 72 hours. Anemia Panel: No results for input(s): VITAMINB12, FOLATE, FERRITIN, TIBC, IRON, RETICCTPCT in the last 72 hours. Sepsis Labs: No results for input(s): PROCALCITON, LATICACIDVEN in the last 168 hours.  Recent Results (from the past 240 hour(s))  SARS CORONAVIRUS 2 (TAT 6-24 HRS) Nasopharyngeal Nasopharyngeal Swab     Status: None   Collection Time: 02/25/19 12:26 AM   Specimen: Nasopharyngeal Swab  Result Value Ref Range Status   SARS Coronavirus 2 NEGATIVE NEGATIVE Final    Comment: (NOTE) SARS-CoV-2 target nucleic acids are NOT DETECTED. The SARS-CoV-2 RNA is generally detectable in upper and lower respiratory specimens during the acute phase of infection. Negative results do not preclude SARS-CoV-2 infection, do not rule out co-infections with other pathogens, and should not be used as the sole basis for treatment or other patient management decisions. Negative results must be combined with clinical observations, patient history, and epidemiological information. The expected result is Negative. Fact Sheet for Patients: SugarRoll.be Fact Sheet for Healthcare Providers: https://www.woods-mathews.com/ This test is not yet approved or cleared by the [place] FDA and  has been authorized for detection and/or diagnosis of SARS-CoV-2 by FDA under an Emergency Use Authorization (EUA). This EUA will remain  in effect (meaning this test can be used) for the duration of the COVID-19  declaration under Section 56 4(b)(1) of the Act, 21 U.S.C. section 360bbb-3(b)(1), unless the authorization is terminated or revoked sooner. Performed at [facility], [place] [address], [place], [place] [address]       Radiology Studies: No results found.    LOS: 9 days   Time spent: More than 50% of that time was spent in counseling and/or coordination of care.  Antonieta Pert, MD [facility]  03/06/2019, 9:07 AM

## 2019-03-06 NOTE — Progress Notes (Signed)
Patient ID: [name], female   DOB: November 08, 1933, 85 y.o.   MRN: [id number]     Advanced Heart Failure Rounding Note  PCP-Cardiologist: Virl Axe, MD   Subjective:    Main complaint remains left leg pain, no change.  Remains anemic but Hgb stable at 7.8.   Some diuresis yesterday but not marked and weight unchanged.  Creatinine stable at 1.42.    Objective:   Weight Range: 74.4 kg Body mass index is 26.47 kg/m.   Vital Signs:   Temp:  [97.5 F (36.4 C)-97.9 F (36.6 C)] 97.6 F (36.4 C) (10/31 0353) Pulse Rate:  [62-72] 68 (10/31 0353) Resp:  [17-20] 17 (10/31 0353) BP: (113-142)/(46-69) 113/46 (10/31 0353) SpO2:  [96 %-100 %] 96 % (10/31 0353) Weight:  [74.4 kg] 74.4 kg (10/31 0249) Last BM Date: 03/04/19  Weight change: Filed Weights   03/04/19 0552 03/05/19 0351 03/06/19 0249  Weight: 75.9 kg 74.6 kg 74.4 kg    Intake/Output:   Intake/Output Summary (Last 24 hours) at 03/06/2019 0933 Last data filed at 03/06/2019 0503 Gross per 24 hour  Intake 720 ml  Output 1800 ml  Net -1080 ml      Physical Exam    General: NAD Neck: JVP 14+ cm, no thyromegaly or thyroid nodule.  Lungs: Clear to auscultation bilaterally with normal respiratory effort. CV: Nondisplaced PMI.  Heart irregular S1/S2, no S3/S4, 2/6 SEM RUSB.  2+ edema to thighs.   Abdomen: Soft, nontender, no hepatosplenomegaly, no distention.  Skin: Ulcerations left lower leg.  Neurologic: Alert and oriented x 3.  Psych: Normal affect. Extremities: right lower leg wrapped, left lower leg with ischemic changes.  HEENT: Normal.    Telemetry    Afib 80s personally reviewed.   EKG    N/a  Labs    CBC Recent Labs    03/05/19 0902 03/06/19 0545  WBC 5.1 6.3  HGB 7.6* 7.8*  HCT 26.9* 27.4*  MCV 84.1 82.8  PLT 268 856   Basic Metabolic Panel Recent Labs    03/05/19 0902 03/06/19 0545  NA 136 135  K 3.9 4.2  CL 96* 93*  CO2 30 31  GLUCOSE 171* 98  BUN 42* 41*  CREATININE  1.44* 1.42*  CALCIUM 8.7* 8.9  MG 2.4  --    Liver Function Tests No results for input(s): AST, ALT, ALKPHOS, BILITOT, PROT, ALBUMIN in the last 72 hours. No results for input(s): LIPASE, AMYLASE in the last 72 hours. Cardiac Enzymes No results for input(s): CKTOTAL, CKMB, CKMBINDEX, TROPONINI in the last 72 hours.  BNP: BNP (last 3 results) Recent Labs    11/04/18 1715 01/20/19 1451 02/24/19 2051  BNP 369.3* 313.4* 314.7*    ProBNP (last 3 results) No results for input(s): PROBNP in the last 8760 hours.   D-Dimer No results for input(s): DDIMER in the last 72 hours. Hemoglobin A1C No results for input(s): HGBA1C in the last 72 hours. Fasting Lipid Panel No results for input(s): CHOL, HDL, LDLCALC, TRIG, CHOLHDL, LDLDIRECT in the last 72 hours. Thyroid Function Tests No results for input(s): TSH, T4TOTAL, T3FREE, THYROIDAB in the last 72 hours.  Invalid input(s): FREET3  Other results:   Imaging    No results found.   Medications:     Scheduled Medications: . apixaban  2.5 mg Oral BID  . cephALEXin  500 mg Oral Q12H  . docusate sodium  100 mg Oral BID  . ezetimibe  10 mg Oral Daily  . fluticasone  2 spray Each Nare Daily  . furosemide  80 mg Intravenous BID  . hydrocortisone cream   Topical BID  . influenza vaccine adjuvanted  0.5 mL Intramuscular Tomorrow-1000  . levothyroxine  25 mcg Oral QAC breakfast  . lidocaine  1 patch Transdermal Q24H  . loratadine  10 mg Oral Daily  . magnesium oxide  400 mg Oral Daily  . potassium chloride  20 mEq Oral BID  . rosuvastatin  40 mg Oral Daily  . sodium chloride flush  3 mL Intravenous Once    Infusions: . sodium chloride 1,000 mL (03/04/19 0957)    PRN Medications: sodium chloride, acetaminophen, bisacodyl, fentaNYL (SUBLIMAZE) injection, ondansetron **OR** ondansetron (ZOFRAN) IV, oxyCODONE **OR** oxyCODONE    Patient Profile   85 y/o woman with h/o diastolic HF with R>L symptoms, permanent AF  (CHADSVaSC = 8), CKD III-IV with solitary kidney admitted with symptomatic anemia, hgb 5.8 on admit requiring transfusion. GI w/u suggestive of possible ischemic bowel. Warfarin stopped and now on apixaban. Major issue now is significant RHF with 2-3+ edema and with weeping lesions. Has previously refused wraps due to LE pain.   Assessment/Plan   1. Acute on chronic diastolic CHF: Echo in 6/50 with EF 60-65% with mild RV dilation. She has a Cardiomems device.She had remote nonischemic cardiomyopathy with recovery of EF, has Medtronic ICD. Suspect that there is significant RV dysfunction. Atrial fibrillation likely contributes, but this appears to be permanent now as she has failed multiple anti-arrhythmics and is reasonably rate-controlled. RHC7/6/20showed mild-moderately elevated PCWP and RA pressure, primarily pulmonary venous hypertension. Prominent v-waves on PCWP tracing may be due to stiff ventricle/diastolic dysfunction as she had only mild MR on recent echo.On exam today, she isstillquite volume overloaded. Creatinine is back to near her baseline at 1.42.  Some diuresis yesterday but still significant volume overload.   - She got Lasix 80 mg IV this morning, will start Lasix infusion at 12 mg/hr and will give metolazone 2.5 again today.   - Unna booton right, cannot tolerate on left.  2. AKI on CKD stage 3: Creatinine improved to 1.42, this is near her baseline.  3. Pulmonary hypertension: She has pulmonary venous hypertension.  4. Anemia: She has history of Fe deficiency anemia. Admitted with GI bleeding, colonoscopy suggestive of ischemic colitis (pathology confirmed). Has had transfusions. Also with diverticulosis. Hgb stable at 7.8, no further overt bleeding.  - Transfuse hgb < 7.  - GI following.  - Switched to apixaban 2.5 mg bid.  5. Atrial fibrillation: Permanent. Transitioned from warfarin to Eliquis with GI bleeding.  6. Cellulitis: Lower legs, on Keflex. 7.  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 However, no "intestinal angina" reported. Seen by Dr. Doren Custard, recommended conservative management unless she were to develop clear symptoms of chronic mesenteric ischemia.  - She is on Crestor.  8. PAD: Bilateral leg pain and ulcerations with chronic ischemic skin changes.  Peripheral arterial dopplers showed significant bilateral PAD.  Seen by Dr. Scot Dock, he was concerned that she may not be able to heal leg wounds.  If worsen, will need CO2 angiography.   Loralie Champagne 03/06/2019 9:33 AM

## 2019-03-07 LAB — BASIC METABOLIC PANEL
Anion gap: 10 (ref 5–15)
BUN: 42 mg/dL — ABNORMAL HIGH (ref 8–23)
CO2: 31 mmol/L (ref 22–32)
Calcium: 8.7 mg/dL — ABNORMAL LOW (ref 8.9–10.3)
Chloride: 95 mmol/L — ABNORMAL LOW (ref 98–111)
Creatinine, Ser: 1.61 mg/dL — ABNORMAL HIGH (ref 0.44–1.00)
GFR calc Af Amer: 34 mL/min — ABNORMAL LOW (ref >60)
GFR calc non Af Amer: 29 mL/min — ABNORMAL LOW (ref >60)
Glucose, Bld: 93 mg/dL (ref 70–99)
Potassium: 3.7 mmol/L (ref 3.5–5.1)
Sodium: 136 mmol/L (ref 135–145)

## 2019-03-07 LAB — CBC
HCT: 25.9 % — ABNORMAL LOW (ref 36.0–46.0)
Hemoglobin: 7.5 g/dL — ABNORMAL LOW (ref 12.0–15.0)
MCH: 23.6 pg — ABNORMAL LOW (ref 26.0–34.0)
MCHC: 29 g/dL — ABNORMAL LOW (ref 30.0–36.0)
MCV: 81.4 fL (ref 80.0–100.0)
Platelets: 255 10*3/uL (ref 150–400)
RBC: 3.18 MIL/uL — ABNORMAL LOW (ref 3.87–5.11)
RDW: 18.5 % — ABNORMAL HIGH (ref 11.5–15.5)
WBC: 6.5 10*3/uL (ref 4.0–10.5)
nRBC: 0 % (ref 0.0–0.2)

## 2019-03-07 LAB — GLUCOSE, CAPILLARY: Glucose-Capillary: 83 mg/dL (ref 70–99)

## 2019-03-07 MED ORDER — POTASSIUM CHLORIDE CRYS ER 20 MEQ PO TBCR
40.0000 meq | EXTENDED_RELEASE_TABLET | Freq: Once | ORAL | Status: AC
  Administered 2019-03-07: 40 meq via ORAL
  Filled 2019-03-07: qty 2

## 2019-03-07 MED ORDER — SODIUM CHLORIDE 0.9% FLUSH
3.0000 mL | Freq: Two times a day (BID) | INTRAVENOUS | Status: DC
  Administered 2019-03-07 – 2019-03-17 (×16): 3 mL via INTRAVENOUS

## 2019-03-07 MED ORDER — METOLAZONE 2.5 MG PO TABS
2.5000 mg | ORAL_TABLET | Freq: Once | ORAL | Status: AC
  Administered 2019-03-07: 2.5 mg via ORAL
  Filled 2019-03-07: qty 1

## 2019-03-07 NOTE — H&P (View-Only) (Signed)
Patient ID: [name], female   DOB: 03-27-1934, 85 y.o.   MRN: [id number]     Advanced Heart Failure Rounding Note  PCP-Cardiologist: Virl Axe, MD   Subjective:    Main complaint remains left leg pain, no change.  Remains anemic with hgb 7.5 today.   She says she diuresed very well yesterday with medication change but I/Os were not recorded accurately.  Creatinine mildly higher at 1.6.     Objective:   Weight Range: 73.6 kg Body mass index is 26.18 kg/m.   Vital Signs:   Temp:  [97.7 F (36.5 C)-97.8 F (36.6 C)] 97.8 F (36.6 C) (10/31 1811) Pulse Rate:  [63-82] 70 (11/01 0842) Resp:  [18] 18 (10/31 1811) BP: (121-133)/(52-67) 131/67 (11/01 0842) SpO2:  [84 %-100 %] 100 % (11/01 0842) Weight:  [73.6 kg-73.8 kg] 73.6 kg (11/01 0303) Last BM Date: 03/06/19  Weight change: Filed Weights   03/06/19 0249 03/07/19 0238 03/07/19 0303  Weight: 74.4 kg 73.8 kg 73.6 kg    Intake/Output:   Intake/Output Summary (Last 24 hours) at 03/07/2019 1017 Last data filed at 03/07/2019 0800 Gross per 24 hour  Intake 344.15 ml  Output 4000 ml  Net -3655.85 ml      Physical Exam    General: NAD Neck: JVP 10 cm, no thyromegaly or thyroid nodule.  Lungs: Clear to auscultation bilaterally with normal respiratory effort. CV: Nondisplaced PMI.  Heart irregular S1/S2, no S3/S4, 2/6 SEM RUSB.  1+ edema to knees.   Abdomen: Soft, nontender, no hepatosplenomegaly, no distention.  Skin: ulcerations left lower leg  Neurologic: Alert and oriented x 3.  Psych: Normal affect. Extremities: Right lower leg wrapped, left lower leg with ischemic changes.   HEENT: Normal.    Telemetry    Afib 80s personally reviewed.   EKG    N/a  Labs    CBC Recent Labs    03/06/19 0545 03/07/19 0541  WBC 6.3 6.5  HGB 7.8* 7.5*  HCT 27.4* 25.9*  MCV 82.8 81.4  PLT 281 188   Basic Metabolic Panel Recent Labs    03/05/19 0902 03/06/19 0545 03/07/19 0541  NA 136 135 136  K 3.9  4.2 3.7  CL 96* 93* 95*  CO2 30 31 31   GLUCOSE 171* 98 93  BUN 42* 41* 42*  CREATININE 1.44* 1.42* 1.61*  CALCIUM 8.7* 8.9 8.7*  MG 2.4  --   --    Liver Function Tests No results for input(s): AST, ALT, ALKPHOS, BILITOT, PROT, ALBUMIN in the last 72 hours. No results for input(s): LIPASE, AMYLASE in the last 72 hours. Cardiac Enzymes No results for input(s): CKTOTAL, CKMB, CKMBINDEX, TROPONINI in the last 72 hours.  BNP: BNP (last 3 results) Recent Labs    11/04/18 1715 01/20/19 1451 02/24/19 2051  BNP 369.3* 313.4* 314.7*    ProBNP (last 3 results) No results for input(s): PROBNP in the last 8760 hours.   D-Dimer No results for input(s): DDIMER in the last 72 hours. Hemoglobin A1C No results for input(s): HGBA1C in the last 72 hours. Fasting Lipid Panel No results for input(s): CHOL, HDL, LDLCALC, TRIG, CHOLHDL, LDLDIRECT in the last 72 hours. Thyroid Function Tests No results for input(s): TSH, T4TOTAL, T3FREE, THYROIDAB in the last 72 hours.  Invalid input(s): FREET3  Other results:   Imaging    No results found.   Medications:     Scheduled Medications: . apixaban  2.5 mg Oral BID  . cephALEXin  500 mg  Oral Q12H  . docusate sodium  100 mg Oral BID  . ezetimibe  10 mg Oral Daily  . fluticasone  2 spray Each Nare Daily  . hydrocortisone cream   Topical BID  . influenza vaccine adjuvanted  0.5 mL Intramuscular Tomorrow-1000  . levothyroxine  25 mcg Oral QAC breakfast  . lidocaine  1 patch Transdermal Q24H  . loratadine  10 mg Oral Daily  . magnesium oxide  400 mg Oral Daily  . metolazone  2.5 mg Oral Once  . potassium chloride  20 mEq Oral BID  . potassium chloride  40 mEq Oral Once  . rosuvastatin  40 mg Oral Daily  . silver sulfADIAZINE   Topical Daily  . sodium chloride flush  3 mL Intravenous Once    Infusions: . sodium chloride 1,000 mL (03/04/19 0957)  . furosemide (LASIX) infusion 12 mg/hr (03/06/19 1135)    PRN Medications:  sodium chloride, acetaminophen, bisacodyl, fentaNYL (SUBLIMAZE) injection, ondansetron **OR** ondansetron (ZOFRAN) IV, oxyCODONE **OR** oxyCODONE    Patient Profile   85 y/o woman with h/o diastolic HF with R>L symptoms, permanent AF (CHADSVaSC = 8), CKD III-IV with solitary kidney admitted with symptomatic anemia, hgb 5.8 on admit requiring transfusion. GI w/u suggestive of possible ischemic bowel. Warfarin stopped and now on apixaban. Major issue now is significant RHF with 2-3+ edema and with weeping lesions. Has previously refused wraps due to LE pain.   Assessment/Plan   1. Acute on chronic diastolic CHF: Echo in 0/24 with EF 60-65% with mild RV dilation. She has a Cardiomems device.She had remote nonischemic cardiomyopathy with recovery of EF, has Medtronic ICD. Suspect that there is significant RV dysfunction. Atrial fibrillation likely contributes, but this appears to be permanent now as she has failed multiple anti-arrhythmics and is reasonably rate-controlled. RHC7/6/20showed mild-moderately elevated PCWP and RA pressure, primarily pulmonary venous hypertension. Prominent v-waves on PCWP tracing may be due to stiff ventricle/diastolic dysfunction as she had only mild MR on recent echo.On exam today, she is still volume overloaded but looks better. Creatinine mildly higher at 1.6.  UOP was not accurately recorded yesterday.    - Continue Lasix infusion at 12 mg/hr and will give metolazone 2.5 again today.   - Unna booton right, cannot tolerate on left. - Will plan RHC tomorrow to make sure we have most of the fluid off her.  We discussed risks/benefits and she agrees to procedure.  Hold tonight's Eliquis in case we have to use femoral access.  2. AKI on CKD stage 3: Creatinine mildly higher at 1.6 today.  3. Pulmonary hypertension: She has pulmonary venous hypertension.  4. Anemia: She has history of Fe deficiency anemia. Admitted with GI bleeding, colonoscopy suggestive of  ischemic colitis (pathology confirmed). Has had transfusions. Also with diverticulosis. Hgb mildly lower at 7.5, no further overt bleeding.  - Transfuse hgb < 7.  - GI following.  - Switched to apixaban 2.5 mg bid.  5. Atrial fibrillation: Permanent. Transitioned from warfarin to Eliquis with GI bleeding.  6. Cellulitis: Lower legs, on Keflex. 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 However, no "intestinal angina" reported. Seen by Dr. Doren Custard, recommended conservative management unless she were to develop clear symptoms of chronic mesenteric ischemia.  - She is on Crestor.  8. PAD: Bilateral leg pain and ulcerations with chronic ischemic skin changes.  Peripheral arterial dopplers showed significant bilateral PAD.  Seen by Dr. Scot Dock, he was concerned that she may not be able to  heal leg wounds.  If worsen, will need CO2 angiography.   Loralie Champagne 03/07/2019 10:17 AM

## 2019-03-07 NOTE — Progress Notes (Signed)
PROGRESS NOTE    [name]  YSA:[id number] DOB: 1933-07-30 DOA: 02/24/2019 PCP: Elby Beck, FNP   Brief Narrative:  Patient is 32 old female with history of atrial fibrillation, status post pacemaker/defibrillator, chronic diastolic CHF, chronic anemia, hypothyroidism, CKD stage III with single functioning kidney who presents to the emergency department with increasing weakness, fall at home, leg swelling and shortness of breath.  Chest x-ray on presentation showed bilateral pleural effusion.  She was found to have bilateral significant lower extremity edema, hemoglobin of 6.2.  She was admitted for the management of acute on chronic CHF exacerbation and started on Lasix drip.  Cardiology following.  Assessment & Plan:   Principal Problem:   Acute on chronic diastolic CHF (congestive heart failure), NYHA class 4 (HCC) Active Problems:   Permanent atrial fibrillation   COPD (chronic obstructive pulmonary disease) (HCC)   Hypertension   Chronic kidney disease (CKD), stage IV (severe) (HCC)   PAD (peripheral artery disease) (HCC)   S/p nephrectomy   Pulmonary hypertension (HCC)   Symptomatic anemia   AKI (acute kidney injury) ([place])   Acute on chronic diastolic CHF exacerbation: Presented with volume overload.  Ejection fraction 66-65% as per last echo.  On torsemide 100 mg twice a day at home.  Currently on Lasix drip.  Status post metolazone x2.  Bilateral lower extremity still edematous, erythematous.  Continue compression wrap on the right.  Currently following.  Continue to monitor input/output, daily weight.  Left lower extremity cellulitis/chronic venous insufficiency: On Keflex.  She has been ordered for silver sulfadiazine cream.  I have requested wound care evaluation.  Symptomatic anemia: Suspected to be from ischemic colitis.  She was seen by GI and underwent colonoscopy and found to have possible ischemic colitis on the left colon.  She was transfused with  PRBCs.  EGD showed mild gastritis, H. pylori negative.  Seen by vascular surgery for possible mesenteric ischemia.  No plan for intervention.  Currently H&H is stable.  Permanent A. fib: Rate is well controlled.  On Eliquis.  Status post pacemaker/defibrillator.  Combined peripheral vascular disease/chronic venous insufficiency: Vascular surgery was consulted for possible mesenteric ischemia.  Also suspected peripheral vascular disease but ABI could not be obtained due to wounds on the left lower extremity.  Vascular surgery recommended follow-up as an outpatient.   There was concern of celiac/SMA stenosis but no postprandial symptoms.  No further treatment plan unless develops clear for symptoms of chronic mesenteric ischemia as per vascular surgery.  Hyperlipidemia: On Crestor  Hypothyroidism: Synthyroid  AKI on CKD stage III: Has a solitary kidney/status post nephrectomy.  Currently kidney function is at baseline.  Left hand pain: Better.           DVT prophylaxis: Eliquis Code Status: DNR Family Communication: None present at the bedside Disposition Plan: Home after cardiology clearance   Consultants: Vascular, cardiology  Procedures: EGD/colonoscopy  Antimicrobials:  Anti-infectives (From admission, onward)   Start     Dose/Rate Route Frequency Ordered Stop   03/04/19 2200  cephALEXin (KEFLEX) capsule 500 mg     500 mg Oral Every 12 hours 03/04/19 1613 03/10/19 2159   03/01/19 1400  ceFAZolin (ANCEF) IVPB 1 g/50 mL premix  Status:  Discontinued     1 g 100 mL/hr over 30 Minutes Intravenous Every 8 hours 03/01/19 1033 03/01/19 1043   03/01/19 1045  ceFAZolin (ANCEF) 0.5 g in dextrose 5 % 100 mL IVPB  Status:  Discontinued     0.5  g 210 mL/hr over 30 Minutes Intravenous Every 12 hours 03/01/19 1043 03/04/19 1613      Subjective:  Patient seen and examined at bedside this morning.  Hemodynamically stable.  Still has significant bilateral lower extremity edema.   Complains of bothering cellulitis on the left lower extremity.  Currently saturating fine on room air.  Denies any chest pain or shortness of breath.   Objective: Vitals:   03/06/19 1811 03/07/19 0238 03/07/19 0303 03/07/19 0842  BP: 122/62   131/67  Pulse: 66   70  Resp: 18     Temp: 97.8 F (36.6 C)     TempSrc: Oral     SpO2: 100%   100%  Weight:  73.8 kg 73.6 kg   Height:        Intake/Output Summary (Last 24 hours) at 03/07/2019 1021 Last data filed at 03/07/2019 0800 Gross per 24 hour  Intake 344.15 ml  Output 4000 ml  Net -3655.85 ml   Filed Weights   03/06/19 0249 03/07/19 0238 03/07/19 0303  Weight: 74.4 kg 73.8 kg 73.6 kg    Examination:  General exam: Pleasant elderly female, debilitated, deconditioned HEENT:PERRL,Oral mucosa moist, Ear/Nose normal on gross exam Respiratory system: Bilateral equal air entry, normal vesicular breath sounds, no wheezes or crackles  Cardiovascular system: S1 & S2 heard, Afib. No JVD, murmurs, rubs, gallops or clicks.  Significant bilateral lower extremity edema.Pacemaker Gastrointestinal system: Abdomen is nondistended, soft and nontender. No organomegaly or masses felt. Normal bowel sounds heard. Central nervous system: Alert and oriented. No focal neurological deficits. Extremities: Bilateral lower extremity edema, erythematous changes, cellulitic changes on the LLE with skin breakdowns Skin: No rashes,no icterus ,no pallor   Data Reviewed: I have personally reviewed following labs and imaging studies  CBC: Recent Labs  Lab 03/03/19 0423 03/04/19 0524 03/05/19 0902 03/06/19 0545 03/07/19 0541  WBC 5.8 5.8 5.1 6.3 6.5  HGB 7.6* 7.6* 7.6* 7.8* 7.5*  HCT 26.0* 26.1* 26.9* 27.4* 25.9*  MCV 82.8 83.1 84.1 82.8 81.4  PLT 237 238 268 281 833   Basic Metabolic Panel: Recent Labs  Lab 03/01/19 0550 03/02/19 0431 03/03/19 0423 03/04/19 0524 03/05/19 0902 03/06/19 0545 03/07/19 0541  NA 136 133* 132* 133* 136 135 136  K  3.5 3.9 4.2 3.7 3.9 4.2 3.7  CL 95* 94* 94* 94* 96* 93* 95*  CO2 29 29 29 27 30 31 31   GLUCOSE 93 97 102* 130* 171* 98 93  BUN 56* 53* 50* 46* 42* 41* 42*  CREATININE 2.01* 1.77* 1.64* 1.65* 1.44* 1.42* 1.61*  CALCIUM 8.5* 8.5* 8.4* 8.4* 8.7* 8.9 8.7*  MG 2.5* 2.5* 2.4  --  2.4  --   --    GFR: Estimated Creatinine Clearance: 27.6 mL/min (A) (by C-G formula based on SCr of 1.61 mg/dL (H)). Liver Function Tests: Recent Labs  Lab 03/01/19 0550 03/02/19 0431 03/03/19 0423  AST 30 31 28   ALT 19 20 17   ALKPHOS 52 53 56  BILITOT 0.7 1.0 1.0  PROT 5.6* 5.5* 5.7*  ALBUMIN 3.0* 3.0* 3.0*   No results for input(s): LIPASE, AMYLASE in the last 168 hours. No results for input(s): AMMONIA in the last 168 hours. Coagulation Profile: No results for input(s): INR, PROTIME in the last 168 hours. Cardiac Enzymes: No results for input(s): CKTOTAL, CKMB, CKMBINDEX, TROPONINI in the last 168 hours. BNP (last 3 results) No results for input(s): PROBNP in the last 8760 hours. HbA1C: No results for input(s): HGBA1C in the  last 72 hours. CBG: No results for input(s): GLUCAP in the last 168 hours. Lipid Profile: No results for input(s): CHOL, HDL, LDLCALC, TRIG, CHOLHDL, LDLDIRECT in the last 72 hours. Thyroid Function Tests: No results for input(s): TSH, T4TOTAL, FREET4, T3FREE, THYROIDAB in the last 72 hours. Anemia Panel: No results for input(s): VITAMINB12, FOLATE, FERRITIN, TIBC, IRON, RETICCTPCT in the last 72 hours. Sepsis Labs: No results for input(s): PROCALCITON, LATICACIDVEN in the last 168 hours.  No results found for this or any previous visit (from the past 240 hour(s)).       Radiology Studies: No results found.      Scheduled Meds: . apixaban  2.5 mg Oral BID  . cephALEXin  500 mg Oral Q12H  . docusate sodium  100 mg Oral BID  . ezetimibe  10 mg Oral Daily  . fluticasone  2 spray Each Nare Daily  . hydrocortisone cream   Topical BID  . influenza vaccine  adjuvanted  0.5 mL Intramuscular Tomorrow-1000  . levothyroxine  25 mcg Oral QAC breakfast  . lidocaine  1 patch Transdermal Q24H  . loratadine  10 mg Oral Daily  . magnesium oxide  400 mg Oral Daily  . metolazone  2.5 mg Oral Once  . potassium chloride  20 mEq Oral BID  . potassium chloride  40 mEq Oral Once  . rosuvastatin  40 mg Oral Daily  . silver sulfADIAZINE   Topical Daily  . sodium chloride flush  3 mL Intravenous Once   Continuous Infusions: . sodium chloride 1,000 mL (03/04/19 0957)  . furosemide (LASIX) infusion 12 mg/hr (03/06/19 1135)     LOS: 10 days    Time spent: 25 mins.More than 50% of that time was spent in counseling and/or coordination of care.      Shelly Coss, MD [facility] Pager [phone]  If 7PM-7AM, please contact night-coverage [email or web address] Password [email or web address] 03/07/2019, 10:21 AM

## 2019-03-07 NOTE — Progress Notes (Addendum)
Patient ID: [name], female   DOB: [birth year], 85 y.o.   MRN: [id number]     Advanced Heart Failure Rounding Note  PCP-Cardiologist: Virl Axe, MD   Subjective:    Main complaint remains left leg pain, no change.  Remains anemic with hgb 7.5 today.   She says she diuresed very well yesterday with medication change but I/Os were not recorded accurately.  Creatinine mildly higher at 1.6.     Objective:   Weight Range: 73.6 kg Body mass index is 26.18 kg/m.   Vital Signs:   Temp:  [97.7 F (36.5 C)-97.8 F (36.6 C)] 97.8 F (36.6 C) (10/31 1811) Pulse Rate:  [63-82] 70 (11/01 0842) Resp:  [18] 18 (10/31 1811) BP: (121-133)/(52-67) 131/67 (11/01 0842) SpO2:  [84 %-100 %] 100 % (11/01 0842) Weight:  [73.6 kg-73.8 kg] 73.6 kg (11/01 0303) Last BM Date: 03/06/19  Weight change: Filed Weights   03/06/19 0249 03/07/19 0238 03/07/19 0303  Weight: 74.4 kg 73.8 kg 73.6 kg    Intake/Output:   Intake/Output Summary (Last 24 hours) at 03/07/2019 1017 Last data filed at 03/07/2019 0800 Gross per 24 hour  Intake 344.15 ml  Output 4000 ml  Net -3655.85 ml      Physical Exam    General: NAD Neck: JVP 10 cm, no thyromegaly or thyroid nodule.  Lungs: Clear to auscultation bilaterally with normal respiratory effort. CV: Nondisplaced PMI.  Heart irregular S1/S2, no S3/S4, 2/6 SEM RUSB.  1+ edema to knees.   Abdomen: Soft, nontender, no hepatosplenomegaly, no distention.  Skin: ulcerations left lower leg  Neurologic: Alert and oriented x 3.  Psych: Normal affect. Extremities: Right lower leg wrapped, left lower leg with ischemic changes.   HEENT: Normal.    Telemetry    Afib 80s personally reviewed.   EKG    N/a  Labs    CBC Recent Labs    03/06/19 0545 03/07/19 0541  WBC 6.3 6.5  HGB 7.8* 7.5*  HCT 27.4* 25.9*  MCV 82.8 81.4  PLT 281 829   Basic Metabolic Panel Recent Labs    03/05/19 0902 03/06/19 0545 03/07/19 0541  NA 136 135 136  K 3.9  4.2 3.7  CL 96* 93* 95*  CO2 30 31 31   GLUCOSE 171* 98 93  BUN 42* 41* 42*  CREATININE 1.44* 1.42* 1.61*  CALCIUM 8.7* 8.9 8.7*  MG 2.4  --   --    Liver Function Tests No results for input(s): AST, ALT, ALKPHOS, BILITOT, PROT, ALBUMIN in the last 72 hours. No results for input(s): LIPASE, AMYLASE in the last 72 hours. Cardiac Enzymes No results for input(s): CKTOTAL, CKMB, CKMBINDEX, TROPONINI in the last 72 hours.  BNP: BNP (last 3 results) Recent Labs    11/04/18 1715 01/20/19 1451 02/24/19 2051  BNP 369.3* 313.4* 314.7*    ProBNP (last 3 results) No results for input(s): PROBNP in the last 8760 hours.   D-Dimer No results for input(s): DDIMER in the last 72 hours. Hemoglobin A1C No results for input(s): HGBA1C in the last 72 hours. Fasting Lipid Panel No results for input(s): CHOL, HDL, LDLCALC, TRIG, CHOLHDL, LDLDIRECT in the last 72 hours. Thyroid Function Tests No results for input(s): TSH, T4TOTAL, T3FREE, THYROIDAB in the last 72 hours.  Invalid input(s): FREET3  Other results:   Imaging    No results found.   Medications:     Scheduled Medications: . apixaban  2.5 mg Oral BID  . cephALEXin  500 mg  Oral Q12H  . docusate sodium  100 mg Oral BID  . ezetimibe  10 mg Oral Daily  . fluticasone  2 spray Each Nare Daily  . hydrocortisone cream   Topical BID  . influenza vaccine adjuvanted  0.5 mL Intramuscular Tomorrow-1000  . levothyroxine  25 mcg Oral QAC breakfast  . lidocaine  1 patch Transdermal Q24H  . loratadine  10 mg Oral Daily  . magnesium oxide  400 mg Oral Daily  . metolazone  2.5 mg Oral Once  . potassium chloride  20 mEq Oral BID  . potassium chloride  40 mEq Oral Once  . rosuvastatin  40 mg Oral Daily  . silver sulfADIAZINE   Topical Daily  . sodium chloride flush  3 mL Intravenous Once    Infusions: . sodium chloride 1,000 mL (03/04/19 0957)  . furosemide (LASIX) infusion 12 mg/hr (03/06/19 1135)    PRN Medications:  sodium chloride, acetaminophen, bisacodyl, fentaNYL (SUBLIMAZE) injection, ondansetron **OR** ondansetron (ZOFRAN) IV, oxyCODONE **OR** oxyCODONE    Patient Profile   85 y/o woman with h/o diastolic HF with R>L symptoms, permanent AF (CHADSVaSC = 8), CKD III-IV with solitary kidney admitted with symptomatic anemia, hgb 5.8 on admit requiring transfusion. GI w/u suggestive of possible ischemic bowel. Warfarin stopped and now on apixaban. Major issue now is significant RHF with 2-3+ edema and with weeping lesions. Has previously refused wraps due to LE pain.   Assessment/Plan   1. Acute on chronic diastolic CHF: Echo in 1/61 with EF 60-65% with mild RV dilation. She has a Cardiomems device.She had remote nonischemic cardiomyopathy with recovery of EF, has Medtronic ICD. Suspect that there is significant RV dysfunction. Atrial fibrillation likely contributes, but this appears to be permanent now as she has failed multiple anti-arrhythmics and is reasonably rate-controlled. RHC7/6/20showed mild-moderately elevated PCWP and RA pressure, primarily pulmonary venous hypertension. Prominent v-waves on PCWP tracing may be due to stiff ventricle/diastolic dysfunction as she had only mild MR on recent echo.On exam today, she is still volume overloaded but looks better. Creatinine mildly higher at 1.6.  UOP was not accurately recorded yesterday.    - Continue Lasix infusion at 12 mg/hr and will give metolazone 2.5 again today.   - Unna booton right, cannot tolerate on left. - Will plan RHC tomorrow to make sure we have most of the fluid off her.  We discussed risks/benefits and she agrees to procedure.  Hold tonight's Eliquis in case we have to use femoral access.  2. AKI on CKD stage 3: Creatinine mildly higher at 1.6 today.  3. Pulmonary hypertension: She has pulmonary venous hypertension.  4. Anemia: She has history of Fe deficiency anemia. Admitted with GI bleeding, colonoscopy suggestive of  ischemic colitis (pathology confirmed). Has had transfusions. Also with diverticulosis. Hgb mildly lower at 7.5, no further overt bleeding.  - Transfuse hgb < 7.  - GI following.  - Switched to apixaban 2.5 mg bid.  5. Atrial fibrillation: Permanent. Transitioned from warfarin to Eliquis with GI bleeding.  6. Cellulitis: Lower legs, on Keflex. 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 However, no "intestinal angina" reported. Seen by Dr. Doren Custard, recommended conservative management unless she were to develop clear symptoms of chronic mesenteric ischemia.  - She is on Crestor.  8. PAD: Bilateral leg pain and ulcerations with chronic ischemic skin changes.  Peripheral arterial dopplers showed significant bilateral PAD.  Seen by Dr. Scot Dock, he was concerned that she may not be able to  heal leg wounds.  If worsen, will need CO2 angiography.   Loralie Champagne 03/07/2019 10:17 AM

## 2019-03-08 ENCOUNTER — Inpatient Hospital Stay (HOSPITAL_COMMUNITY)
Admission: EM | Disposition: A | Discharge status: Home / Self Care | Payer: Self-pay | Source: Home / Self Care | Attending: Internal Medicine

## 2019-03-08 ENCOUNTER — Encounter (HOSPITAL_COMMUNITY): Payer: Self-pay | Admitting: Cardiology

## 2019-03-08 DIAGNOSIS — I509 Heart failure, unspecified: Secondary | ICD-10-CM

## 2019-03-08 HISTORY — PX: RIGHT HEART CATH: CATH118263

## 2019-03-08 LAB — POCT I-STAT EG7
Acid-Base Excess: 8 mmol/L — ABNORMAL HIGH (ref 0.0–2.0)
Acid-Base Excess: 8 mmol/L — ABNORMAL HIGH (ref 0.0–2.0)
Bicarbonate: 31.8 mmol/L — ABNORMAL HIGH (ref 20.0–28.0)
Bicarbonate: 31.6 mmol/L — ABNORMAL HIGH (ref 20.0–28.0)
Calcium, Ion: 1.09 mmol/L — ABNORMAL LOW (ref 1.15–1.40)
Calcium, Ion: 1.14 mmol/L — ABNORMAL LOW (ref 1.15–1.40)
HCT: 25 % — ABNORMAL LOW (ref 36.0–46.0)
HCT: 26 % — ABNORMAL LOW (ref 36.0–46.0)
Hemoglobin: 8.5 g/dL — ABNORMAL LOW (ref 12.0–15.0)
Hemoglobin: 8.8 g/dL — ABNORMAL LOW (ref 12.0–15.0)
O2 Saturation: 53 %
O2 Saturation: 59 %
Potassium: 2.9 mmol/L — ABNORMAL LOW (ref 3.5–5.1)
Potassium: 2.9 mmol/L — ABNORMAL LOW (ref 3.5–5.1)
Sodium: 137 mmol/L (ref 135–145)
Sodium: 136 mmol/L (ref 135–145)
TCO2: 33 mmol/L — ABNORMAL HIGH (ref 22–32)
TCO2: 33 mmol/L — ABNORMAL HIGH (ref 22–32)
pCO2, Ven: 41.7 mmHg — ABNORMAL LOW (ref 44.0–60.0)
pCO2, Ven: 40.9 mmHg — ABNORMAL LOW (ref 44.0–60.0)
pH, Ven: 7.489 — ABNORMAL HIGH (ref 7.250–7.430)
pH, Ven: 7.496 — ABNORMAL HIGH (ref 7.250–7.430)
pO2, Ven: 26 mmHg — CL (ref 32.0–45.0)
pO2, Ven: 28 mmHg — CL (ref 32.0–45.0)

## 2019-03-08 LAB — CBC
HCT: 29.5 % — ABNORMAL LOW (ref 36.0–46.0)
Hemoglobin: 8.3 g/dL — ABNORMAL LOW (ref 12.0–15.0)
MCH: 23.2 pg — ABNORMAL LOW (ref 26.0–34.0)
MCHC: 28.1 g/dL — ABNORMAL LOW (ref 30.0–36.0)
MCV: 82.6 fL (ref 80.0–100.0)
Platelets: 324 10*3/uL (ref 150–400)
RBC: 3.57 MIL/uL — ABNORMAL LOW (ref 3.87–5.11)
RDW: 18.7 % — ABNORMAL HIGH (ref 11.5–15.5)
WBC: 7.1 10*3/uL (ref 4.0–10.5)
nRBC: 0 % (ref 0.0–0.2)

## 2019-03-08 LAB — BASIC METABOLIC PANEL
Anion gap: 15 (ref 5–15)
BUN: 38 mg/dL — ABNORMAL HIGH (ref 8–23)
CO2: 28 mmol/L (ref 22–32)
Calcium: 9.1 mg/dL (ref 8.9–10.3)
Chloride: 94 mmol/L — ABNORMAL LOW (ref 98–111)
Creatinine, Ser: 1.59 mg/dL — ABNORMAL HIGH (ref 0.44–1.00)
GFR calc Af Amer: 35 mL/min — ABNORMAL LOW (ref >60)
GFR calc non Af Amer: 30 mL/min — ABNORMAL LOW (ref >60)
Glucose, Bld: 90 mg/dL (ref 70–99)
Potassium: 3.2 mmol/L — ABNORMAL LOW (ref 3.5–5.1)
Sodium: 137 mmol/L (ref 135–145)

## 2019-03-08 LAB — MAGNESIUM: Magnesium: 2.1 mg/dL (ref 1.7–2.4)

## 2019-03-08 SURGERY — RIGHT HEART CATH
Anesthesia: LOCAL

## 2019-03-08 MED ORDER — APIXABAN 2.5 MG PO TABS
2.5000 mg | ORAL_TABLET | Freq: Two times a day (BID) | ORAL | Status: DC
  Administered 2019-03-08 – 2019-03-11 (×8): 2.5 mg via ORAL
  Filled 2019-03-08 (×9): qty 1

## 2019-03-08 MED ORDER — ASPIRIN 81 MG PO CHEW
81.0000 mg | CHEWABLE_TABLET | ORAL | Status: DC
  Filled 2019-03-08: qty 1

## 2019-03-08 MED ORDER — LIDOCAINE HCL (PF) 1 % IJ SOLN
INTRAMUSCULAR | Status: AC
  Filled 2019-03-08: qty 30

## 2019-03-08 MED ORDER — SODIUM CHLORIDE 0.9 % IV SOLN: 250.0000 mL | INTRAVENOUS | Status: DC | PRN

## 2019-03-08 MED ORDER — SODIUM CHLORIDE 0.9% FLUSH: 3.0000 mL | INTRAVENOUS | Status: DC | PRN

## 2019-03-08 MED ORDER — SODIUM CHLORIDE 0.9 % IV SOLN
INTRAVENOUS | Status: DC
  Administered 2019-03-08: 250 mL via INTRAVENOUS

## 2019-03-08 MED ORDER — LIDOCAINE HCL (PF) 1 % IJ SOLN
INTRAMUSCULAR | Status: DC | PRN
  Administered 2019-03-08: 2 mL via INTRADERMAL

## 2019-03-08 MED ORDER — ASPIRIN 81 MG PO CHEW
81.0000 mg | CHEWABLE_TABLET | ORAL | Status: AC
  Administered 2019-03-08: 81 mg via ORAL
  Filled 2019-03-08: qty 1

## 2019-03-08 MED ORDER — POTASSIUM CHLORIDE CRYS ER 20 MEQ PO TBCR
40.0000 meq | EXTENDED_RELEASE_TABLET | Freq: Once | ORAL | Status: AC
  Administered 2019-03-08: 40 meq via ORAL
  Filled 2019-03-08: qty 2

## 2019-03-08 MED ORDER — FENTANYL CITRATE (PF) 100 MCG/2ML IJ SOLN
INTRAMUSCULAR | Status: AC
  Filled 2019-03-08: qty 2

## 2019-03-08 MED ORDER — SODIUM CHLORIDE 0.9 % IV SOLN: INTRAVENOUS | Status: DC

## 2019-03-08 MED ORDER — HYDROCERIN EX CREA
TOPICAL_CREAM | Freq: Two times a day (BID) | CUTANEOUS | Status: DC
  Administered 2019-03-08 – 2019-03-17 (×12): via TOPICAL
  Filled 2019-03-08: qty 113

## 2019-03-08 MED ORDER — METOLAZONE 2.5 MG PO TABS
2.5000 mg | ORAL_TABLET | Freq: Once | ORAL | Status: AC
  Administered 2019-03-08: 2.5 mg via ORAL
  Filled 2019-03-08: qty 1

## 2019-03-08 MED ORDER — HEPARIN (PORCINE) IN NACL 1000-0.9 UT/500ML-% IV SOLN
INTRAVENOUS | Status: DC | PRN
  Administered 2019-03-08: 500 mL

## 2019-03-08 MED ORDER — HEPARIN (PORCINE) IN NACL 1000-0.9 UT/500ML-% IV SOLN
INTRAVENOUS | Status: AC
  Filled 2019-03-08: qty 500

## 2019-03-08 MED ORDER — POTASSIUM CHLORIDE CRYS ER 20 MEQ PO TBCR
40.0000 meq | EXTENDED_RELEASE_TABLET | Freq: Two times a day (BID) | ORAL | Status: DC
  Administered 2019-03-08 – 2019-03-11 (×7): 40 meq via ORAL
  Filled 2019-03-08 (×7): qty 2

## 2019-03-08 SURGICAL SUPPLY — 9 items
CATH BALLN WEDGE 5F 110CM (CATHETERS) ×2 IMPLANT
PACK CARDIAC CATHETERIZATION (CUSTOM PROCEDURE TRAY) ×2 IMPLANT
PROTECTION STATION PRESSURIZED (MISCELLANEOUS) ×2
SHEATH GLIDE SLENDER 4/5FR (SHEATH) ×2 IMPLANT
SHEATH PROBE COVER 6X72 (BAG) ×2 IMPLANT
STATION PROTECTION PRESSURIZED (MISCELLANEOUS) ×1 IMPLANT
TRANSDUCER W/STOPCOCK (MISCELLANEOUS) ×2 IMPLANT
TUBING ART PRESS 72  MALE/FEM (TUBING) ×1
TUBING ART PRESS 72 MALE/FEM (TUBING) ×1 IMPLANT

## 2019-03-08 NOTE — Consult Note (Addendum)
[facility] Nurse wound consult note [place] consult was previously performed on 10/22. Requested to assess BLE. Xeroform gauze was ordered for LLE and patient states she is unable to tolerate a dressing to the left leg wound location.   Right leg with Ardelia Mems boot; removed to assess right leg.   Dressing procedure/placement/frequency: generalized edema and erythremia; right posterior leg with healing partial thickness wound, .5X.5X.1cm, pink and dry. No other wounds noted.  Left anterior and posterior leg with patchy areas of full thickness wounds surrounded by dry scabbed scaly skin.  Affected area is approx 14X14X.2cm, red and moist, in scattered areas. Pt previously had xeroform dressing applied, which she states she could not tolerate, since it was too painful.  Then she had Silvadene ordered without a dressing, and she stated she could not tolerate this topical treatment, since it burned.  She is reluctant to have any dressing applied or try any topical treatment.  Agreed to try Eucerin cream to LLE if the affected area is left open to air. If she declines, then there are no further topical treatment recommendations to attempt.  Orders provided for ortho tech to re-apply Una boot to right leg and change Q Monday. Please re-consult if further assistance is needed.  Thank-you,  Julien Girt MSN, [place], [place], [place], [place]

## 2019-03-08 NOTE — Progress Notes (Addendum)
PROGRESS NOTE    [name]  WUJ:[id number] DOB: 1934/04/17 DOA: 02/24/2019 PCP: Elby Beck, FNP   Brief Narrative:  Patient is 46 old female with history of atrial fibrillation, status post pacemaker/defibrillator, chronic diastolic CHF, chronic anemia, hypothyroidism, CKD stage III with single functioning kidney who presents to the emergency department with increasing weakness, fall at home, leg swelling and shortness of breath.  Chest x-ray on presentation showed bilateral pleural effusion.  She was found to have bilateral significant lower extremity edema, hemoglobin of 6.2.  She was admitted for the management of acute on chronic CHF exacerbation and started on Lasix drip.  Cardiology following.Underwent right heart catheterization today which showed volume overload.  Plan is to continue Lasix drip.  Assessment & Plan:   Principal Problem:   Acute on chronic diastolic CHF (congestive heart failure), NYHA class 4 (HCC) Active Problems:   Permanent atrial fibrillation   COPD (chronic obstructive pulmonary disease) (HCC)   Hypertension   Chronic kidney disease (CKD), stage IV (severe) (HCC)   PAD (peripheral artery disease) (HCC)   S/p nephrectomy   Pulmonary hypertension (HCC)   Symptomatic anemia   AKI (acute kidney injury) ([place])   Acute on chronic diastolic CHF exacerbation: Presented with volume overload.  Ejection fraction 66-65% as per last echo.  On torsemide 100 mg twice a day at home.  Currently on Lasix drip.  Given a dose of metalazone.  Bilateral lower extremity still edematous, erythematous.  Continue compression wrap ,unna boot on the right.  Cardiology following.  Continue to monitor input/output, daily weight.  Left lower extremity cellulitis/chronic venous insufficiency: On Keflex.   Wound care evaluation done and recommended eucerin cream  Left open ear.  Symptomatic anemia: Suspected to be from ischemic colitis.  She was seen by GI and underwent  colonoscopy and found to have possible ischemic colitis on the left colon.  She was transfused with PRBCs.  EGD showed mild gastritis, H. pylori negative.  Seen by vascular surgery for possible mesenteric ischemia.  No plan for intervention.  Currently H&H is stable.  Permanent A. fib: Rate is well controlled.  On Eliquis.  Status post pacemaker/defibrillator.  Combined peripheral vascular disease/chronic venous insufficiency: Vascular surgery was consulted for possible mesenteric ischemia.  Also suspected peripheral vascular disease but ABI could not be obtained due to wounds on the left lower extremity.  Vascular surgery recommended follow-up as an outpatient.   There was concern of celiac/SMA stenosis but no postprandial symptoms.  No further treatment plan unless develops clear for symptoms of chronic mesenteric ischemia as per vascular surgery.  Hyperlipidemia: On Crestor  Hypothyroidism: Synthyroid  AKI on CKD stage III: Has a solitary kidney/status post nephrectomy.  Currently kidney function is at baseline.  Left hand pain: Better.  Hypokalemia: Being supplemented.  Will check magnesium           DVT prophylaxis: Eliquis Code Status: DNR Family Communication: Called daughter [name] on phone Disposition Plan: Home after cardiology clearance   Consultants: Vascular, cardiology  Procedures: EGD/colonoscopy  Antimicrobials:  Anti-infectives (From admission, onward)   Start     Dose/Rate Route Frequency Ordered Stop   03/04/19 2200  cephALEXin (KEFLEX) capsule 500 mg     500 mg Oral Every 12 hours 03/04/19 1613 03/10/19 2159   03/01/19 1400  ceFAZolin (ANCEF) IVPB 1 g/50 mL premix  Status:  Discontinued     1 g 100 mL/hr over 30 Minutes Intravenous Every 8 hours 03/01/19 1033 03/01/19 1043  03/01/19 1045  ceFAZolin (ANCEF) 0.5 g in dextrose 5 % 100 mL IVPB  Status:  Discontinued     0.5 g 210 mL/hr over 30 Minutes Intravenous Every 12 hours 03/01/19 1043 03/04/19 1613       Subjective:  Patient seen and examined at bedside this morning.  She just came back from cardiac cath.  Looked comfortable, not short of breath, denies any chest pain.  Very bothered by the pain on the left leg.   Objective: Vitals:   03/08/19 0824 03/08/19 0829 03/08/19 0834 03/08/19 0903  BP: (!) 127/55 (!) 131/49 (!) 126/59 (!) 108/97  Pulse: 69 83 77 79  Resp: 20 20 10    Temp:      TempSrc:      SpO2: 99% 100% 100%   Weight:      Height:        Intake/Output Summary (Last 24 hours) at 03/08/2019 1145 Last data filed at 03/08/2019 0919 Gross per 24 hour  Intake 751.16 ml  Output 2750 ml  Net -1998.84 ml   Filed Weights   03/07/19 0238 03/07/19 0303 03/08/19 0509  Weight: 73.8 kg 73.6 kg 72.6 kg    Examination:  General exam: Pleasant elderly female, debilitated, deconditioned HEENT:PERRL,Oral mucosa moist, Ear/Nose normal on gross exam Respiratory system: Bilateral equal air entry, normal vesicular breath sounds, no wheezes or crackles  Cardiovascular system: S1 & S2 heard, Afib. No JVD, murmurs, rubs, gallops or clicks.  Significant bilateral lower extremity edema.Pacemaker Gastrointestinal system: Abdomen is nondistended, soft and nontender. No organomegaly or masses felt. Normal bowel sounds heard. Central nervous system: Alert and oriented. No focal neurological deficits. Extremities: Bilateral lower extremity edema, erythematous changes, mild cellulitic changes on the LLE with skin breakdowns,scabs Skin: no icterus ,no pallor   Data Reviewed: I have personally reviewed following labs and imaging studies  CBC: Recent Labs  Lab 03/04/19 0524 03/05/19 0902 03/06/19 0545 03/07/19 0541 03/08/19 0645 03/08/19 0833  WBC 5.8 5.1 6.3 6.5 7.1  --   HGB 7.6* 7.6* 7.8* 7.5* 8.3* 8.5*  HCT 26.1* 26.9* 27.4* 25.9* 29.5* 25.0*  MCV 83.1 84.1 82.8 81.4 82.6  --   PLT 238 268 281 255 324  --    Basic Metabolic Panel: Recent Labs  Lab 03/02/19 0431 03/03/19  0423 03/04/19 0524 03/05/19 0902 03/06/19 0545 03/07/19 0541 03/08/19 0645 03/08/19 0833  NA 133* 132* 133* 136 135 136 137 137  K 3.9 4.2 3.7 3.9 4.2 3.7 3.2* 2.9*  CL 94* 94* 94* 96* 93* 95* 94*  --   CO2 29 29 27 30 31 31 28   --   GLUCOSE 97 102* 130* 171* 98 93 90  --   BUN 53* 50* 46* 42* 41* 42* 38*  --   CREATININE 1.77* 1.64* 1.65* 1.44* 1.42* 1.61* 1.59*  --   CALCIUM 8.5* 8.4* 8.4* 8.7* 8.9 8.7* 9.1  --   MG 2.5* 2.4  --  2.4  --   --   --   --    GFR: Estimated Creatinine Clearance: 27.8 mL/min (A) (by C-G formula based on SCr of 1.59 mg/dL (H)). Liver Function Tests: Recent Labs  Lab 03/02/19 0431 03/03/19 0423  AST 31 28  ALT 20 17  ALKPHOS 53 56  BILITOT 1.0 1.0  PROT 5.5* 5.7*  ALBUMIN 3.0* 3.0*   No results for input(s): LIPASE, AMYLASE in the last 168 hours. No results for input(s): AMMONIA in the last 168 hours. Coagulation Profile: No results  for input(s): INR, PROTIME in the last 168 hours. Cardiac Enzymes: No results for input(s): CKTOTAL, CKMB, CKMBINDEX, TROPONINI in the last 168 hours. BNP (last 3 results) No results for input(s): PROBNP in the last 8760 hours. HbA1C: No results for input(s): HGBA1C in the last 72 hours. CBG: Recent Labs  Lab 03/07/19 1207  GLUCAP 83   Lipid Profile: No results for input(s): CHOL, HDL, LDLCALC, TRIG, CHOLHDL, LDLDIRECT in the last 72 hours. Thyroid Function Tests: No results for input(s): TSH, T4TOTAL, FREET4, T3FREE, THYROIDAB in the last 72 hours. Anemia Panel: No results for input(s): VITAMINB12, FOLATE, FERRITIN, TIBC, IRON, RETICCTPCT in the last 72 hours. Sepsis Labs: No results for input(s): PROCALCITON, LATICACIDVEN in the last 168 hours.  No results found for this or any previous visit (from the past 240 hour(s)).       Radiology Studies: No results found.      Scheduled Meds: . apixaban  2.5 mg Oral BID  . cephALEXin  500 mg Oral Q12H  . docusate sodium  100 mg Oral BID  .  ezetimibe  10 mg Oral Daily  . fluticasone  2 spray Each Nare Daily  . hydrocerin   Topical BID  . hydrocortisone cream   Topical BID  . influenza vaccine adjuvanted  0.5 mL Intramuscular Tomorrow-1000  . levothyroxine  25 mcg Oral QAC breakfast  . lidocaine  1 patch Transdermal Q24H  . loratadine  10 mg Oral Daily  . magnesium oxide  400 mg Oral Daily  . potassium chloride  40 mEq Oral Once  . potassium chloride  40 mEq Oral BID  . rosuvastatin  40 mg Oral Daily  . sodium chloride flush  3 mL Intravenous Once  . sodium chloride flush  3 mL Intravenous Q12H   Continuous Infusions: . sodium chloride 1,000 mL (03/04/19 0957)  . furosemide (LASIX) infusion 12 mg/hr (03/07/19 1310)     LOS: 11 days    Time spent: 25 mins.More than 50% of that time was spent in counseling and/or coordination of care.      Shelly Coss, MD [facility] Pager [phone]  If 7PM-7AM, please contact night-coverage [email or web address] Password [email or web address] 03/08/2019, 11:45 AM

## 2019-03-08 NOTE — Progress Notes (Signed)
Orthopedic Tech Progress Note Patient Details:  [name] July 15, 1933 [id number]  Ortho Devices Type of Ortho Device: Haematologist Ortho Device/Splint Location: RLE Ortho Device/Splint Interventions: Adjustment, Application, Ordered   Post Interventions Patient Tolerated: Well Instructions Provided: Care of device, Adjustment of device   [name] 03/08/2019, 10:15 AM

## 2019-03-08 NOTE — Progress Notes (Signed)
Patient ID: [name], female   DOB: 1933-11-08, 85 y.o.   MRN: [id number]     Advanced Heart Failure Rounding Note  PCP-Cardiologist: Virl Axe, MD   Subjective:    Still with left leg pain.  Hgb up to 8.3 today.    She diuresed well yesterday, weight down 2 lbs, feels like less leg swelling.  Creatinine stable at 1.59.   RHC Procedural Findings (11/2): Hemodynamics (mmHg) RA mean 13 RV 65/134 PA 63/23 mean 40 PCWP mean 25 with v-waves to 47 Oxygen saturations: PA 59% AO 100% Cardiac Output (Fick) 5.79  Cardiac Index (Fick) 3.18 PVR 2.6 WU  Objective:   Weight Range: 72.6 kg Body mass index is 25.82 kg/m.   Vital Signs:   Temp:  [97.7 F (36.5 C)-98 F (36.7 C)] 97.7 F (36.5 C) (11/02 0509) Pulse Rate:  [47-93] 77 (11/02 0834) Resp:  [10-25] 10 (11/02 0834) BP: (121-137)/(47-66) 126/59 (11/02 0834) SpO2:  [98 %-100 %] 100 % (11/02 0834) Weight:  [72.6 kg] 72.6 kg (11/02 0509) Last BM Date: 03/07/19  Weight change: Filed Weights   03/07/19 0238 03/07/19 0303 03/08/19 0509  Weight: 73.8 kg 73.6 kg 72.6 kg    Intake/Output:   Intake/Output Summary (Last 24 hours) at 03/08/2019 0847 Last data filed at 03/07/2019 2211 Gross per 24 hour  Intake 991.16 ml  Output 1750 ml  Net -758.84 ml      Physical Exam    General: NAD Neck: JVP 10 cm, no thyromegaly or thyroid nodule.  Lungs: Clear to auscultation bilaterally with normal respiratory effort. CV: Nondisplaced PMI.  Heart regular S1/S2, no S3/S4, 2/6 SEM RUSB.  1+ edema to knees.   Abdomen: Soft, nontender, no hepatosplenomegaly, no distention.  Skin: Ulcerations left lower leg.   Neurologic: Alert and oriented x 3.  Psych: Normal affect. Extremities: Right lower leg wrapped, left lower leg with ischemic changes.   HEENT: Normal.   Telemetry    Afib 80s personally reviewed.   EKG    N/a  Labs    CBC Recent Labs    03/07/19 0541 03/08/19 0645  WBC 6.5 7.1  HGB 7.5* 8.3*  HCT  25.9* 29.5*  MCV 81.4 82.6  PLT 255 956   Basic Metabolic Panel Recent Labs    03/07/19 0541 03/08/19 0645  NA 136 137  K 3.7 3.2*  CL 95* 94*  CO2 31 28  GLUCOSE 93 90  BUN 42* 38*  CREATININE 1.61* 1.59*  CALCIUM 8.7* 9.1   Liver Function Tests No results for input(s): AST, ALT, ALKPHOS, BILITOT, PROT, ALBUMIN in the last 72 hours. No results for input(s): LIPASE, AMYLASE in the last 72 hours. Cardiac Enzymes No results for input(s): CKTOTAL, CKMB, CKMBINDEX, TROPONINI in the last 72 hours.  BNP: BNP (last 3 results) Recent Labs    11/04/18 1715 01/20/19 1451 02/24/19 2051  BNP 369.3* 313.4* 314.7*    ProBNP (last 3 results) No results for input(s): PROBNP in the last 8760 hours.   D-Dimer No results for input(s): DDIMER in the last 72 hours. Hemoglobin A1C No results for input(s): HGBA1C in the last 72 hours. Fasting Lipid Panel No results for input(s): CHOL, HDL, LDLCALC, TRIG, CHOLHDL, LDLDIRECT in the last 72 hours. Thyroid Function Tests No results for input(s): TSH, T4TOTAL, T3FREE, THYROIDAB in the last 72 hours.  Invalid input(s): FREET3  Other results:   Imaging    No results found.   Medications:     Scheduled Medications: Marland Kitchen [  MAR Hold] cephALEXin  500 mg Oral Q12H  . [MAR Hold] docusate sodium  100 mg Oral BID  . [MAR Hold] ezetimibe  10 mg Oral Daily  . [MAR Hold] fluticasone  2 spray Each Nare Daily  . [MAR Hold] hydrocortisone cream   Topical BID  . influenza vaccine adjuvanted  0.5 mL Intramuscular Tomorrow-1000  . [MAR Hold] levothyroxine  25 mcg Oral QAC breakfast  . [MAR Hold] lidocaine  1 patch Transdermal Q24H  . [MAR Hold] loratadine  10 mg Oral Daily  . [MAR Hold] magnesium oxide  400 mg Oral Daily  . metolazone  2.5 mg Oral Once  . potassium chloride  40 mEq Oral Once  . potassium chloride  40 mEq Oral BID  . [MAR Hold] rosuvastatin  40 mg Oral Daily  . [MAR Hold] silver sulfADIAZINE   Topical Daily  . [MAR Hold]  sodium chloride flush  3 mL Intravenous Once  . [MAR Hold] sodium chloride flush  3 mL Intravenous Q12H    Infusions: . [MAR Hold] sodium chloride 1,000 mL (03/04/19 0957)  . sodium chloride    . sodium chloride 250 mL (03/08/19 0658)  . furosemide (LASIX) infusion 12 mg/hr (03/07/19 1310)    PRN Medications: [MAR Hold] sodium chloride, sodium chloride, [MAR Hold] acetaminophen, [MAR Hold] bisacodyl, [MAR Hold] fentaNYL (SUBLIMAZE) injection, Heparin (Porcine) in NaCl, lidocaine (PF), [MAR Hold] ondansetron **OR** [MAR Hold] ondansetron (ZOFRAN) IV, [MAR Hold] oxyCODONE **OR** [MAR Hold] oxyCODONE, sodium chloride flush    Patient Profile   85 y/o woman with h/o diastolic HF with R>L symptoms, permanent AF (CHADSVaSC = 8), CKD III-IV with solitary kidney admitted with symptomatic anemia, hgb 5.8 on admit requiring transfusion. GI w/u suggestive of possible ischemic bowel. Warfarin stopped and now on apixaban. Major issue now is significant RHF with 2-3+ edema and with weeping lesions. Has previously refused wraps due to LE pain.   Assessment/Plan   1. Acute on chronic diastolic CHF: Echo in 2/33 with EF 60-65% with mild RV dilation. She has a Cardiomems device.She had remote nonischemic cardiomyopathy with recovery of EF, has Medtronic ICD. Suspect that there is significant RV dysfunction. Atrial fibrillation likely contributes, but this appears to be permanent now as she has failed multiple anti-arrhythmics and is reasonably rate-controlled. RHC7/6/20showed mild-moderately elevated PCWP and RA pressure, primarily pulmonary venous hypertension. Prominent v-waves on PCWP tracing may be due to stiff ventricle/diastolic dysfunction as she had only mild MR on recent echo.Repeat RHC today showed ongoing volume overload despite good diuresis yesterday.  I suspect she will need another day or 2 of aggressive diuresis.  Creatinine is stable.  - Continue Lasix infusion at 12 mg/hr and will give  metolazone 2.5 again today.   - Unna booton right, cannot tolerate on left. 2. AKI on CKD stage 3: Creatinine stable at 1.59 today.  3. Pulmonary hypertension: She has pulmonary venous hypertension.  4. Anemia: She has history of Fe deficiency anemia. Admitted with GI bleeding, colonoscopy suggestive of ischemic colitis (pathology confirmed). Has had transfusions. Also with diverticulosis. Hgb higher at 8.3, no further overt bleeding.  - GI following.  - restart apixaban 2.5 mg bid.  5. Atrial fibrillation: Permanent. Transitioned from warfarin to Eliquis with GI bleeding.  6. Cellulitis: Lower legs, on Keflex. 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 However, no "intestinal angina" reported. Seen by Dr. Doren Custard, recommended conservative management unless she were to develop clear symptoms of chronic mesenteric ischemia.  - She  is on Crestor.  8. PAD: Bilateral leg pain and ulcerations with chronic ischemic skin changes.  Peripheral arterial dopplers showed significant bilateral PAD.  Seen by Dr. Scot Dock, he was concerned that she may not be able to heal leg wounds.  If worsen, will need CO2 angiography.   Loralie Champagne 03/08/2019 8:47 AM

## 2019-03-08 NOTE — Interval H&P Note (Signed)
History and Physical Interval Note:  03/08/2019 8:03 AM  [name]  has presented today for surgery, with the diagnosis of HF.  The various methods of treatment have been discussed with the patient and family. After consideration of risks, benefits and other options for treatment, the patient has consented to  Procedure(s): RIGHT HEART CATH (N/A) as a surgical intervention.  The patient's history has been reviewed, patient examined, no change in status, stable for surgery.  I have reviewed the patient's chart and labs.  Questions were answered to the patient's satisfaction.     [employer]

## 2019-03-08 NOTE — Progress Notes (Signed)
Physical Therapy Treatment Patient Details Name: [name] MRN: [id number] DOB: 1933-06-18 Today's Date: 03/08/2019    History of Present Illness 85 y.o. female with history of A. fib, diastolic CHF last EF measured was 60 to 35% in June 2020, chronic anemia, CKD III with single functioning kidney, presents to the ER 02/24/19 with weakness, fall, SOB and LE edema. Worked up for anemia and CHF.    PT Comments    Pt tolerated treatment well, although continuing to cite LE fatigue which reduces her ambulation capacity. Pt is able to perform all functional mobility with use of RW and no physical assistance. PT provides education for patient to increase daily activity and ambulate out of the room at least twice per day to aid in improving LE muscular endurance, pt agreeable. Pt will continue to benefit from acute PT services to restore her prior level of independence.   Follow Up Recommendations  Home health PT;Supervision/Assistance - 24 hour     Equipment Recommendations  None recommended by PT    Recommendations for Other Services       Precautions / Restrictions Precautions Precautions: Fall Restrictions Weight Bearing Restrictions: No    Mobility  Bed Mobility                  Transfers Overall transfer level: Modified independent Equipment used: Rolling walker (2 wheeled) Transfers: Sit to/from Stand Sit to Stand: Modified independent (Device/Increase time)            Ambulation/Gait Ambulation/Gait assistance: Supervision Gait Distance (Feet): 70 Feet Assistive device: Rolling walker (2 wheeled) Gait Pattern/deviations: Step-through pattern;Shuffle Gait velocity: Decreased Gait velocity interpretation: <1.8 ft/sec, indicate of risk for recurrent falls General Gait Details: slow step through pattern, gait limited by pt shoes which begin to slip off feet as they have become loose with decreased LE edema   Stairs             Wheelchair  Mobility    Modified Rankin (Stroke Patients Only)       Balance Overall balance assessment: Needs assistance Sitting-balance support: Feet supported Sitting balance-Leahy Scale: Good Sitting balance - Comments: independent   Standing balance support: Bilateral upper extremity supported Standing balance-Leahy Scale: Good Standing balance comment: supervision                            Cognition Arousal/Alertness: Awake/alert Behavior During Therapy: WFL for tasks assessed/performed Overall Cognitive Status: Within Functional Limits for tasks assessed                                        Exercises      General Comments General comments (skin integrity, edema, etc.): pt notes some improvement in pain in LLE wounds, does continue to express fatigue      Pertinent Vitals/Pain Pain Assessment: Faces Faces Pain Scale: Hurts even more Pain Location: ankles Pain Descriptors / Indicators: Burning Pain Intervention(s): Limited activity within patient's tolerance    Home Living                      Prior Function            PT Goals (current goals can now be found in the care plan section) Acute Rehab PT Goals Patient Stated Goal: To regain strength and improve mobility Progress towards PT goals: Progressing  toward goals    Frequency    Min 3X/week      PT Plan Current plan remains appropriate    Co-evaluation              AM-PAC PT "6 Clicks" Mobility   Outcome Measure  Help needed turning from your back to your side while in a flat bed without using bedrails?: None Help needed moving from lying on your back to sitting on the side of a flat bed without using bedrails?: None Help needed moving to and from a bed to a chair (including a wheelchair)?: None Help needed standing up from a chair using your arms (e.g., wheelchair or bedside chair)?: None Help needed to walk in hospital room?: None Help needed climbing 3-5  steps with a railing? : A Little 6 Click Score: 23    End of Session Equipment Utilized During Treatment: Gait belt Activity Tolerance: Patient tolerated treatment well Patient left: in bed;with call bell/phone within reach Nurse Communication: Mobility status PT Visit Diagnosis: Other abnormalities of gait and mobility (R26.89)     Time: 3491-7915 PT Time Calculation (min) (ACUTE ONLY): 9 min  Charges:  $Gait Training: 8-22 mins                     Zenaida Niece, PT, DPT Acute Rehabilitation Pager: [phone]    Zenaida Niece 03/08/2019, 4:03 PM

## 2019-03-09 LAB — CBC
HCT: 26.9 % — ABNORMAL LOW (ref 36.0–46.0)
Hemoglobin: 7.6 g/dL — ABNORMAL LOW (ref 12.0–15.0)
MCH: 23.4 pg — ABNORMAL LOW (ref 26.0–34.0)
MCHC: 28.3 g/dL — ABNORMAL LOW (ref 30.0–36.0)
MCV: 82.8 fL (ref 80.0–100.0)
Platelets: 297 10*3/uL (ref 150–400)
RBC: 3.25 MIL/uL — ABNORMAL LOW (ref 3.87–5.11)
RDW: 18.7 % — ABNORMAL HIGH (ref 11.5–15.5)
WBC: 6.9 10*3/uL (ref 4.0–10.5)
nRBC: 0 % (ref 0.0–0.2)

## 2019-03-09 LAB — BASIC METABOLIC PANEL
Anion gap: 13 (ref 5–15)
BUN: 44 mg/dL — ABNORMAL HIGH (ref 8–23)
CO2: 28 mmol/L (ref 22–32)
Calcium: 8.9 mg/dL (ref 8.9–10.3)
Chloride: 95 mmol/L — ABNORMAL LOW (ref 98–111)
Creatinine, Ser: 1.58 mg/dL — ABNORMAL HIGH (ref 0.44–1.00)
GFR calc Af Amer: 35 mL/min — ABNORMAL LOW (ref >60)
GFR calc non Af Amer: 30 mL/min — ABNORMAL LOW (ref >60)
Glucose, Bld: 98 mg/dL (ref 70–99)
Potassium: 3.6 mmol/L (ref 3.5–5.1)
Sodium: 136 mmol/L (ref 135–145)

## 2019-03-09 MED ORDER — METOLAZONE 2.5 MG PO TABS
2.5000 mg | ORAL_TABLET | Freq: Once | ORAL | Status: AC
  Administered 2019-03-09: 2.5 mg via ORAL
  Filled 2019-03-09: qty 1

## 2019-03-09 MED ORDER — POTASSIUM CHLORIDE CRYS ER 20 MEQ PO TBCR
40.0000 meq | EXTENDED_RELEASE_TABLET | Freq: Once | ORAL | Status: AC
  Administered 2019-03-09: 40 meq via ORAL
  Filled 2019-03-09: qty 2

## 2019-03-09 MED ORDER — ACETAMINOPHEN 325 MG PO TABS
650.0000 mg | ORAL_TABLET | ORAL | Status: DC | PRN
  Administered 2019-03-09 – 2019-03-17 (×26): 650 mg via ORAL
  Filled 2019-03-09 (×28): qty 2

## 2019-03-09 MED ORDER — GABAPENTIN 600 MG PO TABS
300.0000 mg | ORAL_TABLET | Freq: Two times a day (BID) | ORAL | Status: DC
  Administered 2019-03-10 – 2019-03-17 (×11): 300 mg via ORAL
  Filled 2019-03-09 (×15): qty 1

## 2019-03-09 MED FILL — Lidocaine HCl Local Preservative Free (PF) Inj 1%: INTRAMUSCULAR | Qty: 30 | Status: AC

## 2019-03-09 MED FILL — Fentanyl Citrate Preservative Free (PF) Inj 100 MCG/2ML: INTRAMUSCULAR | Qty: 2 | Status: AC

## 2019-03-09 NOTE — Progress Notes (Signed)
Patient ID: [name], female   DOB: February 22, 1934, 85 y.o.   MRN: [id number]     Advanced Heart Failure Rounding Note  PCP-Cardiologist: Virl Axe, MD   Subjective:    Still with left leg pain.  Hgb still low at 7.6 today.    Less recorded diuresis today but legs look less swollen.  Creatinine stable at 1.58.   RHC Procedural Findings (11/2): Hemodynamics (mmHg) RA mean 13 RV 65/134 PA 63/23 mean 40 PCWP mean 25 with v-waves to 47 Oxygen saturations: PA 59% AO 100% Cardiac Output (Fick) 5.79  Cardiac Index (Fick) 3.18 PVR 2.6 WU  Objective:   Weight Range: 72.2 kg Body mass index is 25.7 kg/m.   Vital Signs:   Temp:  [97.2 F (36.2 C)-98.4 F (36.9 C)] 98.1 F (36.7 C) (11/03 0435) Pulse Rate:  [62-82] 82 (11/03 0435) Resp:  [18] 18 (11/03 0435) BP: (107-120)/(41-97) 107/50 (11/03 0435) SpO2:  [93 %-96 %] 96 % (11/03 0435) Weight:  [72.2 kg] 72.2 kg (11/03 0435) Last BM Date: 03/08/19  Weight change: Filed Weights   03/07/19 0303 03/08/19 0509 03/09/19 0435  Weight: 73.6 kg 72.6 kg 72.2 kg    Intake/Output:   Intake/Output Summary (Last 24 hours) at 03/09/2019 0859 Last data filed at 03/09/2019 0856 Gross per 24 hour  Intake 2928.1 ml  Output 2700 ml  Net 228.1 ml      Physical Exam    General: NAD Neck: JVP 14 cm, no thyromegaly or thyroid nodule.  Lungs: Clear to auscultation bilaterally with normal respiratory effort. CV: Nondisplaced PMI.  Heart irregular S1/S2, no S3/S4, 2/6 SEM RUSB. 1+ edema to knees.   Abdomen: Soft, nontender, no hepatosplenomegaly, no distention.  Skin: Intact without lesions or rashes.  Neurologic: Alert and oriented x 3.  Psych: Normal affect. Extremities: Right lower leg wrapped, left lower leg with ischemic changes.  HEENT: Normal.   Telemetry    Afib 80s personally reviewed.   EKG    N/a  Labs    CBC Recent Labs    03/08/19 0645  03/08/19 0933 03/09/19 0400  WBC 7.1  --   --  6.9  HGB 8.3*    < > 8.8* 7.6*  HCT 29.5*   < > 26.0* 26.9*  MCV 82.6  --   --  82.8  PLT 324  --   --  297   < > = values in this interval not displayed.   Basic Metabolic Panel Recent Labs    03/08/19 0645  03/08/19 0933 03/08/19 1205 03/09/19 0400  NA 137   < > 136  --  136  K 3.2*   < > 2.9*  --  3.6  CL 94*  --   --   --  95*  CO2 28  --   --   --  28  GLUCOSE 90  --   --   --  98  BUN 38*  --   --   --  44*  CREATININE 1.59*  --   --   --  1.58*  CALCIUM 9.1  --   --   --  8.9  MG  --   --   --  2.1  --    < > = values in this interval not displayed.   Liver Function Tests No results for input(s): AST, ALT, ALKPHOS, BILITOT, PROT, ALBUMIN in the last 72 hours. No results for input(s): LIPASE, AMYLASE in the last 72 hours. Cardiac  Enzymes No results for input(s): CKTOTAL, CKMB, CKMBINDEX, TROPONINI in the last 72 hours.  BNP: BNP (last 3 results) Recent Labs    11/04/18 1715 01/20/19 1451 02/24/19 2051  BNP 369.3* 313.4* 314.7*    ProBNP (last 3 results) No results for input(s): PROBNP in the last 8760 hours.   D-Dimer No results for input(s): DDIMER in the last 72 hours. Hemoglobin A1C No results for input(s): HGBA1C in the last 72 hours. Fasting Lipid Panel No results for input(s): CHOL, HDL, LDLCALC, TRIG, CHOLHDL, LDLDIRECT in the last 72 hours. Thyroid Function Tests No results for input(s): TSH, T4TOTAL, T3FREE, THYROIDAB in the last 72 hours.  Invalid input(s): FREET3  Other results:   Imaging    No results found.   Medications:     Scheduled Medications: . apixaban  2.5 mg Oral BID  . cephALEXin  500 mg Oral Q12H  . docusate sodium  100 mg Oral BID  . ezetimibe  10 mg Oral Daily  . fluticasone  2 spray Each Nare Daily  . hydrocerin   Topical BID  . hydrocortisone cream   Topical BID  . influenza vaccine adjuvanted  0.5 mL Intramuscular Tomorrow-1000  . levothyroxine  25 mcg Oral QAC breakfast  . lidocaine  1 patch Transdermal Q24H  .  loratadine  10 mg Oral Daily  . magnesium oxide  400 mg Oral Daily  . potassium chloride  40 mEq Oral BID  . rosuvastatin  40 mg Oral Daily  . sodium chloride flush  3 mL Intravenous Once  . sodium chloride flush  3 mL Intravenous Q12H    Infusions: . sodium chloride 1,000 mL (03/04/19 0957)  . furosemide (LASIX) infusion 12 mg/hr (03/09/19 0227)    PRN Medications: sodium chloride, acetaminophen, bisacodyl, fentaNYL (SUBLIMAZE) injection, ondansetron **OR** ondansetron (ZOFRAN) IV, oxyCODONE **OR** oxyCODONE    Patient Profile   85 y/o woman with h/o diastolic HF with R>L symptoms, permanent AF (CHADSVaSC = 8), CKD III-IV with solitary kidney admitted with symptomatic anemia, hgb 5.8 on admit requiring transfusion. GI w/u suggestive of possible ischemic bowel. Warfarin stopped and now on apixaban. Major issue now is significant RHF with 2-3+ edema and with weeping lesions. Has previously refused wraps due to LE pain.   Assessment/Plan   1. Acute on chronic diastolic CHF: Echo in 9/62 with EF 60-65% with mild RV dilation. She has a Cardiomems device.She had remote nonischemic cardiomyopathy with recovery of EF, has [employer] ICD. Suspect that there is significant RV dysfunction. Atrial fibrillation likely contributes, but this appears to be permanent now as she has failed multiple anti-arrhythmics and is reasonably rate-controlled. RHC7/6/20showed mild-moderately elevated PCWP and RA pressure, primarily pulmonary venous hypertension. Prominent v-waves on PCWP tracing may be due to stiff ventricle/diastolic dysfunction as she had only mild MR on recent echo.Repeat RHC yesterday showed ongoing volume overload despite good diuresis yesterday.  She does not appear to have diuresed particularly well yesterday though legs do look better.  Creatinine is stable.  - Increase Lasix gtt to 15 mg/hr and will give metolazone 2.5 again today.  Hopefully to po tomorrow. - Unna booton right,  cannot tolerate on left. 2. AKI on CKD stage 3: Creatinine stable at 1.58 today.  3. Pulmonary hypertension: She has pulmonary venous hypertension.  4. Anemia: She has history of Fe deficiency anemia. Admitted with GI bleeding, colonoscopy suggestive of ischemic colitis (pathology confirmed). Has had transfusions. Also with diverticulosis. Hgb still low at 7.6, no further overt bleeding.  -  GI following.  - restarted apixaban 2.5 mg bid.  5. Atrial fibrillation: Permanent. Transitioned from warfarin to Eliquis with GI bleeding.  6. Cellulitis: Lower legs, on Keflex. 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 However, no "intestinal angina" reported. Seen by Dr. Doren Custard, recommended conservative management unless she were to develop clear symptoms of chronic mesenteric ischemia.  - She is on Crestor.  8. PAD: Bilateral leg pain and ulcerations with chronic ischemic skin changes.  Peripheral arterial dopplers showed significant bilateral PAD.  Seen by Dr. Scot Dock, he was concerned that she may not be able to heal leg wounds.  If worsen, will need CO2 angiography.  9. Leg pain: Gabapentin may help.   Loralie Champagne 03/09/2019 8:59 AM

## 2019-03-09 NOTE — Progress Notes (Signed)
PROGRESS NOTE    [name]  MVE:[id number] DOB: 1934-02-04 DOA: 02/24/2019 PCP: Elby Beck, FNP   Brief Narrative:  Patient is 4 old female with history of atrial fibrillation, status post pacemaker/defibrillator, chronic diastolic CHF, chronic anemia, hypothyroidism, CKD stage III with single functioning kidney who presents to the emergency department with increasing weakness, fall at home, leg swelling and shortness of breath.  Chest x-ray on presentation showed bilateral pleural effusion.  She was found to have bilateral significant lower extremity edema, hemoglobin of 6.2.  She was admitted for the management of acute on chronic CHF exacerbation and started on Lasix drip.  Cardiology following.Underwent right heart catheterization on 03/08/19 which showed volume overload.  Plan is to continue Lasix drip.  Assessment & Plan:   Principal Problem:   Acute on chronic diastolic CHF (congestive heart failure), NYHA class 4 (HCC) Active Problems:   Permanent atrial fibrillation   COPD (chronic obstructive pulmonary disease) (HCC)   Hypertension   Chronic kidney disease (CKD), stage IV (severe) (HCC)   PAD (peripheral artery disease) (HCC)   S/p nephrectomy   Pulmonary hypertension (HCC)   Symptomatic anemia   AKI (acute kidney injury) ([place])   Acute on chronic diastolic CHF exacerbation: Presented with volume overload.  Ejection fraction 66-65% as per last echo.  On torsemide 100 mg twice a day at home.  Currently on Lasix drip at 15mg /hr.  Also given a dose of metalazone.  Bilateral lower extremity still edematous, erythematous.  Continue compression wrap ,unna boot on the right.  Cardiology following.  Continue to monitor input/output, daily weight.  Left lower extremity cellulitis/chronic venous insufficiency: On Keflex.   Wound care evaluation done and recommended eucerin cream  Left open to air.  Complains of burning pain on the left lower extremity.  Will try  gabapentin.  Symptomatic anemia: Suspected to be from ischemic colitis.  She was seen by GI and underwent colonoscopy and found to have possible ischemic colitis on the left colon.  She was transfused with PRBCs.  EGD showed mild gastritis, H. pylori negative.  Seen by vascular surgery for possible mesenteric ischemia.  No plan for intervention.  Currently H&H is stable.  Permanent A. fib: Rate is well controlled.  On Eliquis.  Status post pacemaker/defibrillator.  Combined peripheral vascular disease/chronic venous insufficiency: Vascular surgery was consulted for possible mesenteric ischemia.  Also suspected peripheral vascular disease but ABI could not be obtained due to wounds on the left lower extremity.  Vascular surgery recommended follow-up as an outpatient.   There was concern of celiac/SMA stenosis but no postprandial symptoms.  No further treatment plan unless develops clear for symptoms of chronic mesenteric ischemia as per vascular surgery.  Hyperlipidemia: On Crestor  Hypothyroidism: Synthyroid  AKI on CKD stage III: Has a solitary kidney/status post nephrectomy.  Currently kidney function is at baseline.  Left hand pain: Better.  Hypokalemia: Supplemented         DVT prophylaxis: Eliquis Code Status: DNR Family Communication: Called daughter [name] on phone Disposition Plan: Home after cardiology clearance   Consultants: Vascular, cardiology  Procedures: EGD/colonoscopy  Antimicrobials:  Anti-infectives (From admission, onward)   Start     Dose/Rate Route Frequency Ordered Stop   03/04/19 2200  cephALEXin (KEFLEX) capsule 500 mg     500 mg Oral Every 12 hours 03/04/19 1613 03/10/19 2159   03/01/19 1400  ceFAZolin (ANCEF) IVPB 1 g/50 mL premix  Status:  Discontinued     1 g 100 mL/hr  over 30 Minutes Intravenous Every 8 hours 03/01/19 1033 03/01/19 1043   03/01/19 1045  ceFAZolin (ANCEF) 0.5 g in dextrose 5 % 100 mL IVPB  Status:  Discontinued     0.5 g 210  mL/hr over 30 Minutes Intravenous Every 12 hours 03/01/19 1043 03/04/19 1613      Subjective:  Patient seen and examined the bedside this morning.  Hemodynamically stable.  Respiratory status stable.  Denies any shortness of breath.  Complains of severe burning pain on the left lower extremity today.   Objective: Vitals:   03/08/19 1415 03/08/19 1958 03/09/19 0435 03/09/19 0901  BP: 119/69 (!) 113/58 (!) 107/50 121/70  Pulse: 74 69 82 77  Resp: 18 18 18 20   Temp: (!) 97.2 F (36.2 C) 98.4 F (36.9 C) 98.1 F (36.7 C) 98.3 F (36.8 C)  TempSrc: Oral Oral Oral Oral  SpO2:  93% 96% 99%  Weight:   72.2 kg   Height:        Intake/Output Summary (Last 24 hours) at 03/09/2019 1144 Last data filed at 03/09/2019 0912 Gross per 24 hour  Intake 2608.1 ml  Output 1800 ml  Net 808.1 ml   Filed Weights   03/07/19 0303 03/08/19 0509 03/09/19 0435  Weight: 73.6 kg 72.6 kg 72.2 kg    Examination:  General exam: Pleasant elderly female, debilitated, deconditioned HEENT:PERRL,Oral mucosa moist, Ear/Nose normal on gross exam Respiratory system: Bilateral equal air entry, normal vesicular breath sounds, no wheezes or crackles  Cardiovascular system: S1 & S2 heard, Afib. No JVD, murmurs, rubs, gallops or clicks.  Significant bilateral lower extremity edema.has pacemaker Gastrointestinal system: Abdomen is nondistended, soft and nontender. No organomegaly or masses felt. Normal bowel sounds heard. Central nervous system: Alert and oriented. No focal neurological deficits. Extremities: Bilateral lower extremity edema, erythematous changes, mild cellulitic changes on the LLE with skin breakdowns,scabs Skin: no icterus ,no pallor   Data Reviewed: I have personally reviewed following labs and imaging studies  CBC: Recent Labs  Lab 03/05/19 0902 03/06/19 0545 03/07/19 0541 03/08/19 0645 03/08/19 0833 03/08/19 0933 03/09/19 0400  WBC 5.1 6.3 6.5 7.1  --   --  6.9  HGB 7.6* 7.8* 7.5*  8.3* 8.5* 8.8* 7.6*  HCT 26.9* 27.4* 25.9* 29.5* 25.0* 26.0* 26.9*  MCV 84.1 82.8 81.4 82.6  --   --  82.8  PLT 268 281 255 324  --   --  950   Basic Metabolic Panel: Recent Labs  Lab 03/03/19 0423  03/05/19 0902 03/06/19 0545 03/07/19 0541 03/08/19 0645 03/08/19 0833 03/08/19 0933 03/08/19 1205 03/09/19 0400  NA 132*   < > 136 135 136 137 137 136  --  136  K 4.2   < > 3.9 4.2 3.7 3.2* 2.9* 2.9*  --  3.6  CL 94*   < > 96* 93* 95* 94*  --   --   --  95*  CO2 29   < > 30 31 31 28   --   --   --  28  GLUCOSE 102*   < > 171* 98 93 90  --   --   --  98  BUN 50*   < > 42* 41* 42* 38*  --   --   --  44*  CREATININE 1.64*   < > 1.44* 1.42* 1.61* 1.59*  --   --   --  1.58*  CALCIUM 8.4*   < > 8.7* 8.9 8.7* 9.1  --   --   --  8.9  MG 2.4  --  2.4  --   --   --   --   --  2.1  --    < > = values in this interval not displayed.   GFR: Estimated Creatinine Clearance: 28 mL/min (A) (by C-G formula based on SCr of 1.58 mg/dL (H)). Liver Function Tests: Recent Labs  Lab 03/03/19 0423  AST 28  ALT 17  ALKPHOS 56  BILITOT 1.0  PROT 5.7*  ALBUMIN 3.0*   No results for input(s): LIPASE, AMYLASE in the last 168 hours. No results for input(s): AMMONIA in the last 168 hours. Coagulation Profile: No results for input(s): INR, PROTIME in the last 168 hours. Cardiac Enzymes: No results for input(s): CKTOTAL, CKMB, CKMBINDEX, TROPONINI in the last 168 hours. BNP (last 3 results) No results for input(s): PROBNP in the last 8760 hours. HbA1C: No results for input(s): HGBA1C in the last 72 hours. CBG: Recent Labs  Lab 03/07/19 1207  GLUCAP 83   Lipid Profile: No results for input(s): CHOL, HDL, LDLCALC, TRIG, CHOLHDL, LDLDIRECT in the last 72 hours. Thyroid Function Tests: No results for input(s): TSH, T4TOTAL, FREET4, T3FREE, THYROIDAB in the last 72 hours. Anemia Panel: No results for input(s): VITAMINB12, FOLATE, FERRITIN, TIBC, IRON, RETICCTPCT in the last 72 hours. Sepsis Labs:  No results for input(s): PROCALCITON, LATICACIDVEN in the last 168 hours.  No results found for this or any previous visit (from the past 240 hour(s)).       Radiology Studies: No results found.      Scheduled Meds: . apixaban  2.5 mg Oral BID  . cephALEXin  500 mg Oral Q12H  . docusate sodium  100 mg Oral BID  . ezetimibe  10 mg Oral Daily  . fluticasone  2 spray Each Nare Daily  . hydrocerin   Topical BID  . hydrocortisone cream   Topical BID  . influenza vaccine adjuvanted  0.5 mL Intramuscular Tomorrow-1000  . levothyroxine  25 mcg Oral QAC breakfast  . lidocaine  1 patch Transdermal Q24H  . loratadine  10 mg Oral Daily  . magnesium oxide  400 mg Oral Daily  . potassium chloride  40 mEq Oral BID  . rosuvastatin  40 mg Oral Daily  . sodium chloride flush  3 mL Intravenous Once  . sodium chloride flush  3 mL Intravenous Q12H   Continuous Infusions: . sodium chloride 1,000 mL (03/04/19 0957)  . furosemide (LASIX) infusion 15 mg/hr (03/09/19 0917)     LOS: 12 days    Time spent: 25 mins.More than 50% of that time was spent in counseling and/or coordination of care.      Shelly Coss, MD [facility] Pager [phone]  If 7PM-7AM, please contact night-coverage [email or web address] Password [facility] 03/09/2019, 11:44 AM

## 2019-03-10 ENCOUNTER — Encounter (HOSPITAL_COMMUNITY): Payer: PPO

## 2019-03-10 LAB — CBC
HCT: 23.4 % — ABNORMAL LOW (ref 36.0–46.0)
Hemoglobin: 6.8 g/dL — CL (ref 12.0–15.0)
MCH: 23.7 pg — ABNORMAL LOW (ref 26.0–34.0)
MCHC: 29.1 g/dL — ABNORMAL LOW (ref 30.0–36.0)
MCV: 81.5 fL (ref 80.0–100.0)
Platelets: 264 10*3/uL (ref 150–400)
RBC: 2.87 MIL/uL — ABNORMAL LOW (ref 3.87–5.11)
RDW: 18.7 % — ABNORMAL HIGH (ref 11.5–15.5)
WBC: 6.2 10*3/uL (ref 4.0–10.5)
nRBC: 0 % (ref 0.0–0.2)

## 2019-03-10 LAB — BASIC METABOLIC PANEL
Anion gap: 12 (ref 5–15)
BUN: 55 mg/dL — ABNORMAL HIGH (ref 8–23)
CO2: 28 mmol/L (ref 22–32)
Calcium: 8.8 mg/dL — ABNORMAL LOW (ref 8.9–10.3)
Chloride: 96 mmol/L — ABNORMAL LOW (ref 98–111)
Creatinine, Ser: 1.52 mg/dL — ABNORMAL HIGH (ref 0.44–1.00)
GFR calc Af Amer: 37 mL/min — ABNORMAL LOW (ref >60)
GFR calc non Af Amer: 32 mL/min — ABNORMAL LOW (ref >60)
Glucose, Bld: 96 mg/dL (ref 70–99)
Potassium: 3.5 mmol/L (ref 3.5–5.1)
Sodium: 136 mmol/L (ref 135–145)

## 2019-03-10 LAB — PREPARE RBC (CROSSMATCH)

## 2019-03-10 LAB — MAGNESIUM: Magnesium: 2.1 mg/dL (ref 1.7–2.4)

## 2019-03-10 LAB — HEMOGLOBIN AND HEMATOCRIT, BLOOD
HCT: 27.9 % — ABNORMAL LOW (ref 36.0–46.0)
Hemoglobin: 8.2 g/dL — ABNORMAL LOW (ref 12.0–15.0)

## 2019-03-10 MED ORDER — FERROUS SULFATE 325 (65 FE) MG PO TABS
325.0000 mg | ORAL_TABLET | Freq: Every day | ORAL | Status: DC
  Administered 2019-03-11 – 2019-03-14 (×4): 325 mg via ORAL
  Filled 2019-03-10 (×5): qty 1

## 2019-03-10 MED ORDER — SODIUM CHLORIDE 0.9% IV SOLUTION: Freq: Once | INTRAVENOUS | Status: DC

## 2019-03-10 MED ORDER — METOLAZONE 2.5 MG PO TABS
2.5000 mg | ORAL_TABLET | Freq: Once | ORAL | Status: AC
  Administered 2019-03-10: 2.5 mg via ORAL
  Filled 2019-03-10: qty 1

## 2019-03-10 MED ORDER — POTASSIUM CHLORIDE CRYS ER 20 MEQ PO TBCR
40.0000 meq | EXTENDED_RELEASE_TABLET | Freq: Once | ORAL | Status: AC
  Administered 2019-03-10: 40 meq via ORAL
  Filled 2019-03-10: qty 2

## 2019-03-10 NOTE — Progress Notes (Addendum)
Patient ID: [name], female   DOB: Apr 03, 1934, 85 y.o.   MRN: [id number]     Advanced Heart Failure Rounding Note  PCP-Cardiologist: Virl Axe, MD   Subjective:    Still with left leg pain L>R.  Hgb dropped from 7.6>>6.8 overnight.  Feels dizzy. No resting dyspnea.   2.4L out in UOP yesterday. Wt unchanged at 159 lb.  Creatinine stable at 1.52.   RHC Procedural Findings (11/2): Hemodynamics (mmHg) RA mean 13 RV 65/134 PA 63/23 mean 40 PCWP mean 25 with v-waves to 47 Oxygen saturations: PA 59% AO 100% Cardiac Output (Fick) 5.79  Cardiac Index (Fick) 3.18 PVR 2.6 WU  Objective:   Weight Range: 72.3 kg Body mass index is 25.73 kg/m.   Vital Signs:   Temp:  [97.4 F (36.3 C)-98.2 F (36.8 C)] 97.4 F (36.3 C) (11/04 0359) Pulse Rate:  [68-78] 68 (11/04 0359) Resp:  [17] 17 (11/04 0359) BP: (108-128)/(46-59) 108/46 (11/04 0359) SpO2:  [93 %-99 %] 99 % (11/04 0359) Weight:  [72.3 kg] 72.3 kg (11/04 0002) Last BM Date: 03/08/19  Weight change: Filed Weights   03/08/19 0509 03/09/19 0435 03/10/19 0002  Weight: 72.6 kg 72.2 kg 72.3 kg    Intake/Output:   Intake/Output Summary (Last 24 hours) at 03/10/2019 1013 Last data filed at 03/10/2019 0852 Gross per 24 hour  Intake 840 ml  Output 2376 ml  Net -1536 ml      Physical Exam    PHYSICAL EXAM: General:  Elderly WF. No respiratory difficulty HEENT: normal Neck: supple. elevated JVD. Carotids 2+ bilat; no bruits. No lymphadenopathy or thyromegaly appreciated. Cor: PMI nondisplaced. irregular rhythm, regular rate. No rubs, gallops or murmurs. Lungs: clear Abdomen: soft, nontender, nondistended. No hepatosplenomegaly. No bruits or masses. Good bowel sounds. Extremities: no cyanosis, clubbing, rash. + bilateral LEE, unna boot on the Rt. Lt LE edematous w/ venous stasis dermatitis, weeping/crusting sores Neuro: alert & oriented x 3, cranial nerves grossly intact. moves all 4 extremities w/o difficulty.  Affect pleasant.   Telemetry    Afib 80s personally reviewed.   EKG    N/a  Labs    CBC Recent Labs    03/09/19 0400 03/10/19 0348  WBC 6.9 6.2  HGB 7.6* 6.8*  HCT 26.9* 23.4*  MCV 82.8 81.5  PLT 297 196   Basic Metabolic Panel Recent Labs    03/08/19 1205 03/09/19 0400 03/10/19 0348  NA  --  136 136  K  --  3.6 3.5  CL  --  95* 96*  CO2  --  28 28  GLUCOSE  --  98 96  BUN  --  44* 55*  CREATININE  --  1.58* 1.52*  CALCIUM  --  8.9 8.8*  MG 2.1  --  2.1   Liver Function Tests No results for input(s): AST, ALT, ALKPHOS, BILITOT, PROT, ALBUMIN in the last 72 hours. No results for input(s): LIPASE, AMYLASE in the last 72 hours. Cardiac Enzymes No results for input(s): CKTOTAL, CKMB, CKMBINDEX, TROPONINI in the last 72 hours.  BNP: BNP (last 3 results) Recent Labs    11/04/18 1715 01/20/19 1451 02/24/19 2051  BNP 369.3* 313.4* 314.7*    ProBNP (last 3 results) No results for input(s): PROBNP in the last 8760 hours.   D-Dimer No results for input(s): DDIMER in the last 72 hours. Hemoglobin A1C No results for input(s): HGBA1C in the last 72 hours. Fasting Lipid Panel No results for input(s): CHOL, HDL, LDLCALC, TRIG, CHOLHDL,  LDLDIRECT in the last 72 hours. Thyroid Function Tests No results for input(s): TSH, T4TOTAL, T3FREE, THYROIDAB in the last 72 hours.  Invalid input(s): FREET3  Other results:   Imaging    No results found.   Medications:     Scheduled Medications: . sodium chloride   Intravenous Once  . apixaban  2.5 mg Oral BID  . docusate sodium  100 mg Oral BID  . ezetimibe  10 mg Oral Daily  . fluticasone  2 spray Each Nare Daily  . gabapentin  300 mg Oral BID  . hydrocerin   Topical BID  . hydrocortisone cream   Topical BID  . influenza vaccine adjuvanted  0.5 mL Intramuscular Tomorrow-1000  . levothyroxine  25 mcg Oral QAC breakfast  . lidocaine  1 patch Transdermal Q24H  . loratadine  10 mg Oral Daily  . magnesium  oxide  400 mg Oral Daily  . potassium chloride  40 mEq Oral BID  . rosuvastatin  40 mg Oral Daily  . sodium chloride flush  3 mL Intravenous Once  . sodium chloride flush  3 mL Intravenous Q12H    Infusions: . sodium chloride 1,000 mL (03/04/19 0957)  . furosemide (LASIX) infusion 15 mg/hr (03/09/19 2125)    PRN Medications: sodium chloride, acetaminophen, bisacodyl, fentaNYL (SUBLIMAZE) injection, ondansetron **OR** ondansetron (ZOFRAN) IV, oxyCODONE **OR** oxyCODONE    Patient Profile   85 y/o woman with h/o diastolic HF with R>L symptoms, permanent AF (CHADSVaSC = 8), CKD III-IV with solitary kidney admitted with symptomatic anemia, hgb 5.8 on admit requiring transfusion. GI w/u suggestive of possible ischemic bowel. Warfarin stopped and now on apixaban. Major issue now is significant RHF with 2-3+ edema and with weeping lesions. Has previously refused wraps due to LE pain.   Assessment/Plan   1. Acute on chronic diastolic CHF: Echo in 2/44 with EF 60-65% with mild RV dilation. She has a Cardiomems device.She had remote nonischemic cardiomyopathy with recovery of EF, has [employer] ICD. Suspect that there is significant RV dysfunction. Atrial fibrillation likely contributes, but this appears to be permanent now as she has failed multiple anti-arrhythmics and is reasonably rate-controlled. RHC7/6/20showed mild-moderately elevated PCWP and RA pressure, primarily pulmonary venous hypertension. Prominent v-waves on PCWP tracing may be due to stiff ventricle/diastolic dysfunction as she had only mild MR on recent echo.Repeat RHC 11/2 showed ongoing volume overload despite good diuresis yesterday.  She does not appear to have diuresed particularly well yesterday though legs do look better.  Creatinine is stable. Remains volume overloaded on exam. - Continue Lasix gtt to 15 mg/hr and will give another dose of metolazone 2.5 again today.   - Unna booton right, cannot tolerate on left.  2. AKI on CKD stage 3: Creatinine stable at 1.52 today.  3. Pulmonary hypertension: She has pulmonary venous hypertension.  4. Anemia: She has history of Fe deficiency anemia. Admitted with GI bleeding, colonoscopy suggestive of ischemic colitis (pathology confirmed). Has had transfusions. Also with diverticulosis. Hgb back down to 6.8 today.  - GI following.  - restarted apixaban 2.5 mg bid.  - transfuse today w/ Hgb <7.0. getting 1 unit RBCs 5. Atrial fibrillation: Permanent. CVR in the 80s. Transitioned from warfarin to Eliquis with GI bleeding.  6. Cellulitis: Lower legs, on Keflex. 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 However, no "intestinal angina" reported. Seen by Dr. Doren Custard, recommended conservative management unless she were to develop clear symptoms of chronic mesenteric ischemia.  - She is  on Crestor.  8. PAD: Bilateral leg pain and ulcerations with chronic ischemic skin changes.  Peripheral arterial dopplers showed significant bilateral PAD.  Seen by Dr. Scot Dock, he was concerned that she may not be able to heal leg wounds.  If worsen, will need CO2 angiography.  9. Leg pain: Gabapentin may help.   Lyda Jester 03/10/2019 10:13 AM  Patient seen with PA, agree with the above note.   Hgb lower at 6.8, no overt bleeding.  She got 1 unit PRBCs.  She has good UOP but weight has not been coming down, seems to be drinking a fair amount.  Today, she feels "dizzy" and nauseated."  She got 1 unit PRBCs with hgb up to 8.2.   General: NAD Neck: JVP 9-10 cm, no thyromegaly or thyroid nodule.  Lungs: Clear to auscultation bilaterally with normal respiratory effort. CV: Nondisplaced PMI.  Heart irregular S1/S2, no S3/S4, 2/6 SEM RUSB.  1+ edema to knees.   Abdomen: Soft, nontender, no hepatosplenomegaly, no distention.  Skin: Intact without lesions or rashes.  Neurologic: Alert and oriented x 3.  Psych: Normal affect. Extremities:  Ischemic changes lower legs HEENT: Normal.   Still seems to be oozing blood from ischemic colitis though no overt signs of GI bleeding (no melena, BRBPR).  Appropriate hgb increase with 1 unit PRBCs.  Continue low dose Eliquis for now.    Volume status looks to be slowly improving.  She cannot keep legs raised due to discomfort.  Still some volume overload but better.  With rise in BUN and dizziness, will stop Lasix gtt this afternoon (she had metolazone this morning) and reassess tomorrow morning.   Loralie Champagne 03/10/2019 4:55 PM

## 2019-03-10 NOTE — Progress Notes (Signed)
PROGRESS NOTE    [name]  UXN:[id number] DOB: [birth year] DOA: 02/24/2019 PCP: Elby Beck, FNP   Brief Narrative:  Patient is 59 old female with history of atrial fibrillation, status post pacemaker/defibrillator, chronic diastolic CHF, chronic anemia, hypothyroidism, CKD stage III with single functioning kidney who presents to the emergency department with increasing weakness, fall at home, leg swelling and shortness of breath.  Chest x-ray on presentation showed bilateral pleural effusion.  She was found to have bilateral significant lower extremity edema, hemoglobin of 6.2.  She was admitted for the management of acute on chronic CHF exacerbation and started on Lasix drip.  Cardiology following.Underwent right heart catheterization on 03/08/19 which showed volume overload.  Plan is to continue Lasix drip for now.  Assessment & Plan:   Principal Problem:   Acute on chronic diastolic CHF (congestive heart failure), NYHA class 4 (HCC) Active Problems:   Permanent atrial fibrillation   COPD (chronic obstructive pulmonary disease) (HCC)   Hypertension   Chronic kidney disease (CKD), stage IV (severe) (HCC)   PAD (peripheral artery disease) (HCC)   S/p nephrectomy   Pulmonary hypertension (HCC)   Symptomatic anemia   AKI (acute kidney injury) ([place])   Acute on chronic diastolic CHF exacerbation: Presented with volume overload.  Ejection fraction 66-65% as per last echo.  On torsemide 100 mg twice a day at home.  Currently on Lasix drip at 15mg /hr. She has been also given few days metalazone.  Bilateral lower extremity still edematous, erythematous.  Continue compression wrap ,unna boot on the right.  Cardiology following.  Continue to monitor input/output, daily weight.  Left lower extremity cellulitis/chronic venous insufficiency: On Keflex.   Wound care evaluation done and recommended eucerin cream  Left open to air.  Complains of burning pain on the left lower extremity.   Ordered gabapentin but patient denies to take.  Symptomatic anemia: Hemoglobin dropped to 6.8 today.  Being transfused with a unit of PRBC.  She is not having any active bleeding.  Suspected to be from ischemic colitis.  She was seen by GI and underwent colonoscopy and found to have possible ischemic colitis on the left colon.  She was transfused with PRBCs.  EGD showed mild gastritis, H. pylori negative.  Seen by vascular surgery for possible mesenteric ischemia.  No plan for intervention.    Permanent A. fib: Rate is well controlled.  On Eliquis.  Status post pacemaker/defibrillator.  Combined peripheral vascular disease/chronic venous insufficiency: Vascular surgery was consulted for possible mesenteric ischemia.  Also suspected peripheral vascular disease but ABI could not be obtained due to wounds on the left lower extremity.  Vascular surgery recommended follow-up as an outpatient.   There was concern of celiac/SMA stenosis but no postprandial symptoms.  No further treatment plan unless develops clear for symptoms of chronic mesenteric ischemia as per vascular surgery.  Hyperlipidemia: On Crestor  Hypothyroidism: Synthyroid  AKI on CKD stage III: Has a solitary kidney/status post nephrectomy.  Currently kidney function is at baseline.  Left hand pain: Better.  Hypokalemia: Supplemented         DVT prophylaxis: Eliquis Code Status: DNR Family Communication: Called daughter [name] on phone on 03/09/19 Disposition Plan: Home after cardiology clearance   Consultants: Vascular, cardiology  Procedures: EGD/colonoscopy  Antimicrobials:  Anti-infectives (From admission, onward)   Start     Dose/Rate Route Frequency Ordered Stop   03/04/19 2200  cephALEXin (KEFLEX) capsule 500 mg     500 mg Oral Every 12 hours  03/04/19 1613 03/10/19 1001   03/01/19 1400  ceFAZolin (ANCEF) IVPB 1 g/50 mL premix  Status:  Discontinued     1 g 100 mL/hr over 30 Minutes Intravenous Every 8 hours  03/01/19 1033 03/01/19 1043   03/01/19 1045  ceFAZolin (ANCEF) 0.5 g in dextrose 5 % 100 mL IVPB  Status:  Discontinued     0.5 g 210 mL/hr over 30 Minutes Intravenous Every 12 hours 03/01/19 1043 03/04/19 1613      Subjective:  Patient seen and examined the bedside this morning.  Hemodynamically stable.  Feels dizzy today.  Hemoglobin dropped to 6.8.  Denies any active bleeding.  Complains of persistent pain on the left leg but denies to take gabapentin.   Objective: Vitals:   03/10/19 1043 03/10/19 1110 03/10/19 1250 03/10/19 1250  BP: 118/60 112/69 114/67 114/67  Pulse: 71 76 71 71  Resp: 20 20 16 16   Temp: 97.8 F (36.6 C) 98 F (36.7 C) (!) 97.4 F (36.3 C) (!) 97.4 F (36.3 C)  TempSrc: Oral Oral Oral Oral  SpO2: 99% 97%  96%  Weight:      Height:        Intake/Output Summary (Last 24 hours) at 03/10/2019 1339 Last data filed at 03/10/2019 1317 Gross per 24 hour  Intake 1570 ml  Output 2376 ml  Net -806 ml   Filed Weights   03/08/19 0509 03/09/19 0435 03/10/19 0002  Weight: 72.6 kg 72.2 kg 72.3 kg    Examination:  General exam: Pleasant elderly female, debilitated, deconditioned HEENT:PERRL,Oral mucosa moist, Ear/Nose normal on gross exam Respiratory system: Bilateral equal air entry, normal vesicular breath sounds, no wheezes or crackles  Cardiovascular system: S1 & S2 heard, Afib. No JVD, murmurs, rubs, gallops or clicks.  Significant bilateral lower extremity edema.has pacemaker Gastrointestinal system: Abdomen is nondistended, soft and nontender. No organomegaly or masses felt. Normal bowel sounds heard. Central nervous system: Alert and oriented. No focal neurological deficits. Extremities: Bilateral lower extremity edema, erythematous changes, mild cellulitic changes on the LLE with skin breakdowns,scabs Skin: no icterus ,no pallor   Data Reviewed: I have personally reviewed following labs and imaging studies  CBC: Recent Labs  Lab 03/06/19 0545  03/07/19 0541 03/08/19 0645 03/08/19 0833 03/08/19 0933 03/09/19 0400 03/10/19 0348  WBC 6.3 6.5 7.1  --   --  6.9 6.2  HGB 7.8* 7.5* 8.3* 8.5* 8.8* 7.6* 6.8*  HCT 27.4* 25.9* 29.5* 25.0* 26.0* 26.9* 23.4*  MCV 82.8 81.4 82.6  --   --  82.8 81.5  PLT 281 255 324  --   --  297 854   Basic Metabolic Panel: Recent Labs  Lab 03/05/19 0902 03/06/19 0545 03/07/19 0541 03/08/19 0645 03/08/19 0833 03/08/19 0933 03/08/19 1205 03/09/19 0400 03/10/19 0348  NA 136 135 136 137 137 136  --  136 136  K 3.9 4.2 3.7 3.2* 2.9* 2.9*  --  3.6 3.5  CL 96* 93* 95* 94*  --   --   --  95* 96*  CO2 30 31 31 28   --   --   --  28 28  GLUCOSE 171* 98 93 90  --   --   --  98 96  BUN 42* 41* 42* 38*  --   --   --  44* 55*  CREATININE 1.44* 1.42* 1.61* 1.59*  --   --   --  1.58* 1.52*  CALCIUM 8.7* 8.9 8.7* 9.1  --   --   --  8.9 8.8*  MG 2.4  --   --   --   --   --  2.1  --  2.1   GFR: Estimated Creatinine Clearance: 29.1 mL/min (A) (by C-G formula based on SCr of 1.52 mg/dL (H)). Liver Function Tests: No results for input(s): AST, ALT, ALKPHOS, BILITOT, PROT, ALBUMIN in the last 168 hours. No results for input(s): LIPASE, AMYLASE in the last 168 hours. No results for input(s): AMMONIA in the last 168 hours. Coagulation Profile: No results for input(s): INR, PROTIME in the last 168 hours. Cardiac Enzymes: No results for input(s): CKTOTAL, CKMB, CKMBINDEX, TROPONINI in the last 168 hours. BNP (last 3 results) No results for input(s): PROBNP in the last 8760 hours. HbA1C: No results for input(s): HGBA1C in the last 72 hours. CBG: Recent Labs  Lab 03/07/19 1207  GLUCAP 83   Lipid Profile: No results for input(s): CHOL, HDL, LDLCALC, TRIG, CHOLHDL, LDLDIRECT in the last 72 hours. Thyroid Function Tests: No results for input(s): TSH, T4TOTAL, FREET4, T3FREE, THYROIDAB in the last 72 hours. Anemia Panel: No results for input(s): VITAMINB12, FOLATE, FERRITIN, TIBC, IRON, RETICCTPCT in the last  72 hours. Sepsis Labs: No results for input(s): PROCALCITON, LATICACIDVEN in the last 168 hours.  No results found for this or any previous visit (from the past 240 hour(s)).       Radiology Studies: No results found.      Scheduled Meds: . sodium chloride   Intravenous Once  . apixaban  2.5 mg Oral BID  . docusate sodium  100 mg Oral BID  . ezetimibe  10 mg Oral Daily  . fluticasone  2 spray Each Nare Daily  . gabapentin  300 mg Oral BID  . hydrocerin   Topical BID  . hydrocortisone cream   Topical BID  . influenza vaccine adjuvanted  0.5 mL Intramuscular Tomorrow-1000  . levothyroxine  25 mcg Oral QAC breakfast  . lidocaine  1 patch Transdermal Q24H  . loratadine  10 mg Oral Daily  . magnesium oxide  400 mg Oral Daily  . metolazone  2.5 mg Oral Once  . potassium chloride  40 mEq Oral BID  . potassium chloride  40 mEq Oral Once  . rosuvastatin  40 mg Oral Daily  . sodium chloride flush  3 mL Intravenous Once  . sodium chloride flush  3 mL Intravenous Q12H   Continuous Infusions: . sodium chloride 1,000 mL (03/04/19 0957)  . furosemide (LASIX) infusion 15 mg/hr (03/09/19 2125)     LOS: 13 days    Time spent: 25 mins.More than 50% of that time was spent in counseling and/or coordination of care.      Shelly Coss, MD [facility] Pager [phone]  If 7PM-7AM, please contact night-coverage [email or web address] Password [email or web address] 03/10/2019, 1:39 PM

## 2019-03-10 NOTE — Plan of Care (Signed)
  Problem: Education: Goal: Knowledge of General Education information will improve Description Including pain rating scale, medication(s)/side effects and non-pharmacologic comfort measures Outcome: Progressing   Problem: Health Behavior/Discharge Planning: Goal: Ability to manage health-related needs will improve Outcome: Progressing   Problem: Clinical Measurements: Goal: Ability to maintain clinical measurements within normal limits will improve Outcome: Progressing Goal: Will remain free from infection Outcome: Progressing Goal: Diagnostic test results will improve Outcome: Progressing Goal: Respiratory complications will improve Outcome: Progressing Goal: Cardiovascular complication will be avoided Outcome: Progressing   Problem: Activity: Goal: Risk for activity intolerance will decrease Outcome: Progressing   Problem: Nutrition: Goal: Adequate nutrition will be maintained Outcome: Progressing   Problem: Coping: Goal: Level of anxiety will decrease Outcome: Progressing   Problem: Elimination: Goal: Will not experience complications related to bowel motility Outcome: Progressing Goal: Will not experience complications related to urinary retention Outcome: Progressing   Problem: Pain Managment: Goal: General experience of comfort will improve Outcome: Progressing   Problem: Safety: Goal: Ability to remain free from injury will improve Outcome: Progressing   Problem: Skin Integrity: Goal: Risk for impaired skin integrity will decrease Outcome: Progressing   Problem: Education: Goal: Ability to demonstrate management of disease process will improve Outcome: Progressing Goal: Ability to verbalize understanding of medication therapies will improve Outcome: Progressing Goal: Individualized Educational Video(s) Outcome: Progressing   Problem: Activity: Goal: Capacity to carry out activities will improve Outcome: Progressing   Problem: Cardiac: Goal:  Ability to achieve and maintain adequate cardiopulmonary perfusion will improve Outcome: Progressing   Problem: Education: Goal: Understanding of CV disease, CV risk reduction, and recovery process will improve Outcome: Progressing Goal: Individualized Educational Video(s) Outcome: Progressing   Problem: Activity: Goal: Ability to return to baseline activity level will improve Outcome: Progressing   Problem: Cardiovascular: Goal: Ability to achieve and maintain adequate cardiovascular perfusion will improve Outcome: Progressing Goal: Vascular access site(s) Level 0-1 will be maintained Outcome: Progressing   Problem: Health Behavior/Discharge Planning: Goal: Ability to safely manage health-related needs after discharge will improve Outcome: Progressing   

## 2019-03-10 NOTE — Care Management Important Message (Signed)
Important Message  Patient Details  Name: [name] MRN: [id number] Date of Birth: October 17, 1933   Medicare Important Message Given:  Yes     Shelda Altes 03/10/2019, 1:21 PM

## 2019-03-10 NOTE — Progress Notes (Signed)
PT. With critical Hgb of 6.8 this am. On call for [facility] paged to make aware.

## 2019-03-10 NOTE — Progress Notes (Signed)
PT Cancellation Note  Patient Details Name: [name] MRN: [id number] DOB: [birth year]   Cancelled Treatment:    Reason Eval/Treat Not Completed: Medical issues which prohibited therapy. PT attempted to se patient for treatment however pt reporting dizziness at rest and increased dizziness when utilizing bedside commode. Pt declining further attempts at mobility 2/2 dizziness. PT will continue to follow as time allows.  Zenaida Niece, PT, DPT Acute Rehabilitation Pager: [phone]    Zenaida Niece 03/10/2019, 3:16 PM

## 2019-03-11 LAB — CBC
HCT: 26.6 % — ABNORMAL LOW (ref 36.0–46.0)
Hemoglobin: 7.7 g/dL — ABNORMAL LOW (ref 12.0–15.0)
MCH: 24.4 pg — ABNORMAL LOW (ref 26.0–34.0)
MCHC: 28.9 g/dL — ABNORMAL LOW (ref 30.0–36.0)
MCV: 84.2 fL (ref 80.0–100.0)
Platelets: 293 10*3/uL (ref 150–400)
RBC: 3.16 MIL/uL — ABNORMAL LOW (ref 3.87–5.11)
RDW: 18.8 % — ABNORMAL HIGH (ref 11.5–15.5)
WBC: 6.2 10*3/uL (ref 4.0–10.5)
nRBC: 0 % (ref 0.0–0.2)

## 2019-03-11 LAB — BASIC METABOLIC PANEL
Anion gap: 11 (ref 5–15)
BUN: 66 mg/dL — ABNORMAL HIGH (ref 8–23)
CO2: 30 mmol/L (ref 22–32)
Calcium: 9 mg/dL (ref 8.9–10.3)
Chloride: 95 mmol/L — ABNORMAL LOW (ref 98–111)
Creatinine, Ser: 1.84 mg/dL — ABNORMAL HIGH (ref 0.44–1.00)
GFR calc Af Amer: 29 mL/min — ABNORMAL LOW (ref >60)
GFR calc non Af Amer: 25 mL/min — ABNORMAL LOW (ref >60)
Glucose, Bld: 106 mg/dL — ABNORMAL HIGH (ref 70–99)
Potassium: 5.3 mmol/L — ABNORMAL HIGH (ref 3.5–5.1)
Sodium: 136 mmol/L (ref 135–145)

## 2019-03-11 MED ORDER — TORSEMIDE 20 MG PO TABS
80.0000 mg | ORAL_TABLET | Freq: Two times a day (BID) | ORAL | Status: DC
  Administered 2019-03-11 – 2019-03-13 (×5): 80 mg via ORAL
  Filled 2019-03-11 (×5): qty 4

## 2019-03-11 NOTE — Progress Notes (Signed)
Patient ID: [name], female   DOB: 30-Oct-1933, 85 y.o.   MRN: [id number]     Advanced Heart Failure Rounding Note  PCP-Cardiologist: Virl Axe, MD   Subjective:    Still with left leg pain L>R.  Leg swelling has improved.    Hgb up to 7.7 after 1 unit transfusion yesterday.    Weight down 1 lb, creatinine up to 1.84.  Lasix gtt stopped yesterday afternoon.    RHC Procedural Findings (11/2): Hemodynamics (mmHg) RA mean 13 RV 65/134 PA 63/23 mean 40 PCWP mean 25 with v-waves to 47 Oxygen saturations: PA 59% AO 100% Cardiac Output (Fick) 5.79  Cardiac Index (Fick) 3.18 PVR 2.6 WU  Objective:   Weight Range: 71.9 kg Body mass index is 25.6 kg/m.   Vital Signs:   Temp:  [97.2 F (36.2 C)-98 F (36.7 C)] 97.2 F (36.2 C) (11/04 2024) Pulse Rate:  [67-76] 67 (11/04 2024) Resp:  [16-20] 18 (11/04 2024) BP: (112-151)/(67-104) 151/104 (11/04 2024) SpO2:  [79 %-97 %] 79 % (11/04 2024) Weight:  [71.9 kg] 71.9 kg (11/05 0405) Last BM Date: 03/08/19  Weight change: Filed Weights   03/09/19 0435 03/10/19 0002 03/11/19 0405  Weight: 72.2 kg 72.3 kg 71.9 kg    Intake/Output:   Intake/Output Summary (Last 24 hours) at 03/11/2019 1047 Last data filed at 03/11/2019 1041 Gross per 24 hour  Intake 1631.53 ml  Output 1750 ml  Net -118.47 ml      Physical Exam    General: NAD Neck: JVP 10 cm, no thyromegaly or thyroid nodule.  Lungs: Clear to auscultation bilaterally with normal respiratory effort. CV: Nondisplaced PMI.  Heart irregular S1/S2, no S3/S4, 2/6 SEM RUSB.  1+ edema 1/2 to knees bilaterally.  Abdomen: Soft, nontender, no hepatosplenomegaly, no distention.  Skin: chronic ulcerations left lower leg.  Neurologic: Alert and oriented x 3.  Psych: Normal affect. Extremities: No clubbing or cyanosis.  HEENT: Normal.    Telemetry    Afib 80s personally reviewed.   EKG    N/a  Labs    CBC Recent Labs    03/10/19 0348 03/10/19 1518 03/11/19  0420  WBC 6.2  --  6.2  HGB 6.8* 8.2* 7.7*  HCT 23.4* 27.9* 26.6*  MCV 81.5  --  84.2  PLT 264  --  283   Basic Metabolic Panel Recent Labs    03/08/19 1205  03/10/19 0348 03/11/19 0420  NA  --    < > 136 136  K  --    < > 3.5 5.3*  CL  --    < > 96* 95*  CO2  --    < > 28 30  GLUCOSE  --    < > 96 106*  BUN  --    < > 55* 66*  CREATININE  --    < > 1.52* 1.84*  CALCIUM  --    < > 8.8* 9.0  MG 2.1  --  2.1  --    < > = values in this interval not displayed.   Liver Function Tests No results for input(s): AST, ALT, ALKPHOS, BILITOT, PROT, ALBUMIN in the last 72 hours. No results for input(s): LIPASE, AMYLASE in the last 72 hours. Cardiac Enzymes No results for input(s): CKTOTAL, CKMB, CKMBINDEX, TROPONINI in the last 72 hours.  BNP: BNP (last 3 results) Recent Labs    11/04/18 1715 01/20/19 1451 02/24/19 2051  BNP 369.3* 313.4* 314.7*    ProBNP (last  3 results) No results for input(s): PROBNP in the last 8760 hours.   D-Dimer No results for input(s): DDIMER in the last 72 hours. Hemoglobin A1C No results for input(s): HGBA1C in the last 72 hours. Fasting Lipid Panel No results for input(s): CHOL, HDL, LDLCALC, TRIG, CHOLHDL, LDLDIRECT in the last 72 hours. Thyroid Function Tests No results for input(s): TSH, T4TOTAL, T3FREE, THYROIDAB in the last 72 hours.  Invalid input(s): FREET3  Other results:   Imaging    No results found.   Medications:     Scheduled Medications: . sodium chloride   Intravenous Once  . apixaban  2.5 mg Oral BID  . docusate sodium  100 mg Oral BID  . ezetimibe  10 mg Oral Daily  . ferrous sulfate  325 mg Oral Q breakfast  . fluticasone  2 spray Each Nare Daily  . gabapentin  300 mg Oral BID  . hydrocerin   Topical BID  . hydrocortisone cream   Topical BID  . influenza vaccine adjuvanted  0.5 mL Intramuscular Tomorrow-1000  . levothyroxine  25 mcg Oral QAC breakfast  . lidocaine  1 patch Transdermal Q24H  .  loratadine  10 mg Oral Daily  . magnesium oxide  400 mg Oral Daily  . rosuvastatin  40 mg Oral Daily  . sodium chloride flush  3 mL Intravenous Once  . sodium chloride flush  3 mL Intravenous Q12H    Infusions: . sodium chloride 1,000 mL (03/04/19 0957)    PRN Medications: sodium chloride, acetaminophen, bisacodyl, fentaNYL (SUBLIMAZE) injection, ondansetron **OR** ondansetron (ZOFRAN) IV, oxyCODONE **OR** oxyCODONE    Patient Profile   85 y/o woman with h/o diastolic HF with R>L symptoms, permanent AF (CHADSVaSC = 8), CKD III-IV with solitary kidney admitted with symptomatic anemia, hgb 5.8 on admit requiring transfusion. GI w/u suggestive of possible ischemic bowel. Warfarin stopped and now on apixaban. Major issue now is significant RHF with 2-3+ edema and with weeping lesions. Has previously refused wraps due to LE pain.   Assessment/Plan   1. Acute on chronic diastolic CHF: Echo in 5/32 with EF 60-65% with mild RV dilation. She has a Cardiomems device.She had remote nonischemic cardiomyopathy with recovery of EF, has [employer] ICD. Suspect that there is significant RV dysfunction. Atrial fibrillation likely contributes, but this appears to be permanent now as she has failed multiple anti-arrhythmics and is reasonably rate-controlled. RHC7/6/20showed mild-moderately elevated PCWP and RA pressure, primarily pulmonary venous hypertension. Prominent v-waves on PCWP tracing may be due to stiff ventricle/diastolic dysfunction as she had only mild MR on recent echo.Repeat RHC 11/2 showed ongoing volume overload. Volume status looks better today though still appears to be mildly overloaded.  Creatinine up to 1.84.  Lasix gtt stopped yesterday afternoon. - Start torsemide 80 mg bid this evening.     - Unna booton right, cannot tolerate on left. 2. AKI on CKD stage 3: Creatinine up to 1.84. Lasix gtt stopped yesterday, no diuretic this morning, restart torsemide at 80 mg bid this  evening.   3. Pulmonary hypertension: She has pulmonary venous hypertension.  4. Anemia: She has history of Fe deficiency anemia. Admitted with GI bleeding, colonoscopy suggestive of ischemic colitis (pathology confirmed). Has had transfusions. Also with diverticulosis. She got 1 unit PRBCs yesterday, hgb up to 7.7. - GI following.  - restarted apixaban 2.5 mg bid.  - transfuse today w/ Hgb <7.0.  5. Atrial fibrillation: Permanent. CVR in the 80s. Transitioned from warfarin to Eliquis with GI bleeding.  6. Cellulitis: Lower legs, completed course of Keflex. 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 However, no "intestinal angina" reported. Seen by Dr. Doren Custard, recommended conservative management unless she were to develop clear symptoms of chronic mesenteric ischemia.  - She is on Crestor.  8. PAD: Bilateral leg pain and ulcerations with chronic ischemic skin changes.  Peripheral arterial dopplers showed significant bilateral PAD.  Seen by Dr. Scot Dock, he was concerned that she may not be able to heal leg wounds.  If worsen, will need CO2 angiography.  9. Leg pain: Gabapentin may help.   Hopefully she will be ready to go home tomorrow.   Loralie Champagne 03/11/2019 10:53 AM

## 2019-03-11 NOTE — Progress Notes (Signed)
PROGRESS NOTE    [name]  CZY:[id number] DOB: 01-27-1934 DOA: 02/24/2019 PCP: Elby Beck, FNP   Brief Narrative:  Patient is 64 old female with history of atrial fibrillation, status post pacemaker/defibrillator, chronic diastolic CHF, chronic anemia, hypothyroidism, CKD stage III with single functioning kidney who presents to the emergency department with increasing weakness, fall at home, leg swelling and shortness of breath.  Chest x-ray on presentation showed bilateral pleural effusion.  She was found to have bilateral significant lower extremity edema, hemoglobin of 6.2.  She was admitted for the management of acute on chronic CHF exacerbation and started on Lasix drip.  Cardiology following.Underwent right heart catheterization on 03/08/19 which showed volume overload.  She was started on Lasix drip.  Lasix drip has been stopped today and she has been started on torsemide.  Plan to discharge home tomorrow  Assessment & Plan:   Principal Problem:   Acute on chronic diastolic CHF (congestive heart failure), NYHA class 4 (HCC) Active Problems:   Permanent atrial fibrillation   COPD (chronic obstructive pulmonary disease) (HCC)   Hypertension   Chronic kidney disease (CKD), stage IV (severe) (HCC)   PAD (peripheral artery disease) (HCC)   S/p nephrectomy   Pulmonary hypertension (HCC)   Symptomatic anemia   AKI (acute kidney injury) ([facility])   Acute on chronic diastolic CHF exacerbation: Presented with volume overload.  Ejection fraction 66-65% as per last echo.  On torsemide 100 mg twice a day at home.  She was  on Lasix drip at 15mg /hr. She was also given few days metalazone.  Bilateral lower extremity edema has improved.  Continue compression wrap ,unna boot on the right.  Cardiology was following.  Lasix drip stopped and she will be started on torsemide 80 mg twice daily.  Left lower extremity cellulitis/chronic venous insufficiency: On Keflex.   Wound care evaluation  done and recommended eucerin cream  Left open to air.  Complains of burning pain on the left lower extremity.  Ordered gabapentin but patient denies to take.  Symptomatic anemia: Hemoglobin dropped to 6.8 so she was transfused with a unit of PRBC.  Hb in the range of 7 now.She is not having any active bleeding.  Suspected to be from ischemic colitis.  She was seen by GI and underwent colonoscopy and found to have possible ischemic colitis on the left colon.  She was transfused with PRBCs.  EGD showed mild gastritis, H. pylori negative.  Seen by vascular surgery for possible mesenteric ischemia.  No plan for intervention.  Follow up with gastroenterology as an outpatient.  Permanent A. fib: Rate is well controlled.  On Eliquis.  Status post pacemaker/defibrillator.  Combined peripheral vascular disease/chronic venous insufficiency: Vascular surgery was consulted for possible mesenteric ischemia.  Also suspected peripheral vascular disease but ABI could not be obtained due to wounds on the left lower extremity.  Vascular surgery recommended follow-up as an outpatient.   There was concern of celiac/SMA stenosis but no postprandial symptoms.  No further treatment plan unless develops clear for symptoms of chronic mesenteric ischemia as per vascular surgery.  Hyperlipidemia: On Crestor  Hypothyroidism: Synthyroid  AKI on CKD stage III: Has a solitary kidney/status post nephrectomy.  Currently kidney function is at baseline.  Left hand pain: Better.  Hypokalemia: Supplemented         DVT prophylaxis: Eliquis Code Status: DNR Family Communication: Called daughter [name] on phone on 03/10/19 Disposition Plan: DC home tomorrow   Consultants: Vascular, cardiology  Procedures: EGD/colonoscopy  Antimicrobials:  Anti-infectives (From admission, onward)   Start     Dose/Rate Route Frequency Ordered Stop   03/04/19 2200  cephALEXin (KEFLEX) capsule 500 mg     500 mg Oral Every 12 hours 03/04/19  1613 03/10/19 1001   03/01/19 1400  ceFAZolin (ANCEF) IVPB 1 g/50 mL premix  Status:  Discontinued     1 g 100 mL/hr over 30 Minutes Intravenous Every 8 hours 03/01/19 1033 03/01/19 1043   03/01/19 1045  ceFAZolin (ANCEF) 0.5 g in dextrose 5 % 100 mL IVPB  Status:  Discontinued     0.5 g 210 mL/hr over 30 Minutes Intravenous Every 12 hours 03/01/19 1043 03/04/19 1613      Subjective:  Patient seen and examined the bedside this morning.  Hemodynamically stable.  Looks more comfortable today.  Lasix drip has been stopped.  Bilateral lower extremity edema might have improved.  Leg pain better today.  Objective: Vitals:   03/10/19 1250 03/10/19 1250 03/10/19 2024 03/11/19 0405  BP: 114/67 114/67 (!) 151/104   Pulse: 71 71 67   Resp: 16 16 18    Temp: (!) 97.4 F (36.3 C) (!) 97.4 F (36.3 C) (!) 97.2 F (36.2 C)   TempSrc: Oral Oral Oral   SpO2:  96% (!) 79%   Weight:    71.9 kg  Height:        Intake/Output Summary (Last 24 hours) at 03/11/2019 1129 Last data filed at 03/11/2019 1041 Gross per 24 hour  Intake 1631.53 ml  Output 1750 ml  Net -118.47 ml   Filed Weights   03/09/19 0435 03/10/19 0002 03/11/19 0405  Weight: 72.2 kg 72.3 kg 71.9 kg    Examination:  General exam: Pleasant elderly female, debilitated, deconditioned HEENT:PERRL,Oral mucosa moist, Ear/Nose normal on gross exam Respiratory system: Bilateral equal air entry, normal vesicular breath sounds, no wheezes or crackles  Cardiovascular system: S1 & S2 heard, Afib. No JVD, murmurs, rubs, gallops or clicks.  1-2+  lower extremity edema.has pacemaker Gastrointestinal system: Abdomen is nondistended, soft and nontender. No organomegaly or masses felt. Normal bowel sounds heard. Central nervous system: Alert and oriented. No focal neurological deficits. Extremities: 1-2+ bilateral  lower extremity edema, erythematous changes, mild cellulitic changes on the LLE with skin breakdowns,scabs Skin: no icterus ,no pallor    Data Reviewed: I have personally reviewed following labs and imaging studies  CBC: Recent Labs  Lab 03/07/19 0541 03/08/19 0645  03/08/19 0933 03/09/19 0400 03/10/19 0348 03/10/19 1518 03/11/19 0420  WBC 6.5 7.1  --   --  6.9 6.2  --  6.2  HGB 7.5* 8.3*   < > 8.8* 7.6* 6.8* 8.2* 7.7*  HCT 25.9* 29.5*   < > 26.0* 26.9* 23.4* 27.9* 26.6*  MCV 81.4 82.6  --   --  82.8 81.5  --  84.2  PLT 255 324  --   --  297 264  --  293   < > = values in this interval not displayed.   Basic Metabolic Panel: Recent Labs  Lab 03/05/19 0902  03/07/19 0541 03/08/19 0645 03/08/19 0833 03/08/19 0933 03/08/19 1205 03/09/19 0400 03/10/19 0348 03/11/19 0420  NA 136   < > 136 137 137 136  --  136 136 136  K 3.9   < > 3.7 3.2* 2.9* 2.9*  --  3.6 3.5 5.3*  CL 96*   < > 95* 94*  --   --   --  95* 96* 95*  CO2 30   < >  31 28  --   --   --  28 28 30   GLUCOSE 171*   < > 93 90  --   --   --  98 96 106*  BUN 42*   < > 42* 38*  --   --   --  44* 55* 66*  CREATININE 1.44*   < > 1.61* 1.59*  --   --   --  1.58* 1.52* 1.84*  CALCIUM 8.7*   < > 8.7* 9.1  --   --   --  8.9 8.8* 9.0  MG 2.4  --   --   --   --   --  2.1  --  2.1  --    < > = values in this interval not displayed.   GFR: Estimated Creatinine Clearance: 23.5 mL/min (A) (by C-G formula based on SCr of 1.84 mg/dL (H)). Liver Function Tests: No results for input(s): AST, ALT, ALKPHOS, BILITOT, PROT, ALBUMIN in the last 168 hours. No results for input(s): LIPASE, AMYLASE in the last 168 hours. No results for input(s): AMMONIA in the last 168 hours. Coagulation Profile: No results for input(s): INR, PROTIME in the last 168 hours. Cardiac Enzymes: No results for input(s): CKTOTAL, CKMB, CKMBINDEX, TROPONINI in the last 168 hours. BNP (last 3 results) No results for input(s): PROBNP in the last 8760 hours. HbA1C: No results for input(s): HGBA1C in the last 72 hours. CBG: Recent Labs  Lab 03/07/19 1207  GLUCAP 83   Lipid Profile: No  results for input(s): CHOL, HDL, LDLCALC, TRIG, CHOLHDL, LDLDIRECT in the last 72 hours. Thyroid Function Tests: No results for input(s): TSH, T4TOTAL, FREET4, T3FREE, THYROIDAB in the last 72 hours. Anemia Panel: No results for input(s): VITAMINB12, FOLATE, FERRITIN, TIBC, IRON, RETICCTPCT in the last 72 hours. Sepsis Labs: No results for input(s): PROCALCITON, LATICACIDVEN in the last 168 hours.  No results found for this or any previous visit (from the past 240 hour(s)).       Radiology Studies: No results found.      Scheduled Meds: . sodium chloride   Intravenous Once  . apixaban  2.5 mg Oral BID  . docusate sodium  100 mg Oral BID  . ezetimibe  10 mg Oral Daily  . ferrous sulfate  325 mg Oral Q breakfast  . fluticasone  2 spray Each Nare Daily  . gabapentin  300 mg Oral BID  . hydrocerin   Topical BID  . hydrocortisone cream   Topical BID  . influenza vaccine adjuvanted  0.5 mL Intramuscular Tomorrow-1000  . levothyroxine  25 mcg Oral QAC breakfast  . lidocaine  1 patch Transdermal Q24H  . loratadine  10 mg Oral Daily  . magnesium oxide  400 mg Oral Daily  . rosuvastatin  40 mg Oral Daily  . sodium chloride flush  3 mL Intravenous Once  . sodium chloride flush  3 mL Intravenous Q12H  . torsemide  80 mg Oral BID   Continuous Infusions: . sodium chloride 1,000 mL (03/04/19 0957)     LOS: 14 days    Time spent: 25 mins.More than 50% of that time was spent in counseling and/or coordination of care.      Shelly Coss, MD [facility] Pager [phone]  If 7PM-7AM, please contact night-coverage [email or web address] Password [facility] 03/11/2019, 11:29 AM

## 2019-03-11 NOTE — Progress Notes (Signed)
Physical Therapy Treatment Patient Details Name: [name] MRN: [id number] DOB: [birth year] Today's Date: 03/11/2019    History of Present Illness 85 y.o. female with history of A. fib, diastolic CHF last EF measured was 60 to 35% in June 2020, chronic anemia, CKD III with single functioning kidney, presents to the ER 02/24/19 with weakness, fall, SOB and LE edema. Worked up for anemia and CHF.  Pt with cardiac cath on 03/08/19 that showed volume overload per MD note.    PT Comments    Pt reports fatigue today but was still able to ambulate with PT.  She required cues for safety and fatigued easily.  Pt declined trying stairs today.  Will cont to benefit from PT to advance as able.    Follow Up Recommendations  Home health PT;Supervision/Assistance - 24 hour     Equipment Recommendations  None recommended by PT    Recommendations for Other Services       Precautions / Restrictions Precautions Precautions: Fall Restrictions Weight Bearing Restrictions: No    Mobility  Bed Mobility               General bed mobility comments: Pt at EOB upon arrival - CNA had assisted  Transfers Overall transfer level: Needs assistance Equipment used: Rolling walker (2 wheeled) Transfers: Sit to/from Stand Sit to Stand: Min guard         General transfer comment: cues for safety; toielting ADLs with CGA for balance; performed sit to stand x 3  Ambulation/Gait Ambulation/Gait assistance: Min guard Gait Distance (Feet): 80 Feet Assistive device: Rolling walker (2 wheeled) Gait Pattern/deviations: Step-through pattern;Shuffle     General Gait Details: pt with decreased heel strike on the right when cued she was able to correct; cues for RW safety   Stairs             Wheelchair Mobility    Modified Rankin (Stroke Patients Only)       Balance Overall balance assessment: Needs assistance Sitting-balance support: Feet supported Sitting balance-Leahy Scale:  Good     Standing balance support: Bilateral upper extremity supported Standing balance-Leahy Scale: Good                              Cognition Arousal/Alertness: Awake/alert Behavior During Therapy: WFL for tasks assessed/performed Overall Cognitive Status: Within Functional Limits for tasks assessed                                        Exercises      General Comments General comments (skin integrity, edema, etc.): Pt expressed fatigue.  Her Hgb was 7.7 -she reports she is getting PRBC today.      Pertinent Vitals/Pain Pain Assessment: No/denies pain    Home Living                      Prior Function            PT Goals (current goals can now be found in the care plan section) Acute Rehab PT Goals Potential to Achieve Goals: Good Progress towards PT goals: Progressing toward goals    Frequency    Min 3X/week      PT Plan Current plan remains appropriate    Co-evaluation              AM-PAC  PT "6 Clicks" Mobility   Outcome Measure  Help needed turning from your back to your side while in a flat bed without using bedrails?: None Help needed moving from lying on your back to sitting on the side of a flat bed without using bedrails?: None Help needed moving to and from a bed to a chair (including a wheelchair)?: None Help needed standing up from a chair using your arms (e.g., wheelchair or bedside chair)?: None Help needed to walk in hospital room?: None Help needed climbing 3-5 steps with a railing? : A Little 6 Click Score: 11    End of Session Equipment Utilized During Treatment: Gait belt Activity Tolerance: Patient tolerated treatment well Patient left: in bed;with call bell/phone within reach;with bed alarm set Nurse Communication: Mobility status PT Visit Diagnosis: Other abnormalities of gait and mobility (R26.89)     Time: 1460-4799 PT Time Calculation (min) (ACUTE ONLY): 30 min  Charges:  $Gait  Training: 8-22 mins $Therapeutic Activity: 8-22 mins                     [name], PT Acute Rehab Services [phone]    [name] 03/11/2019, 3:15 PM

## 2019-03-12 LAB — BASIC METABOLIC PANEL
Anion gap: 12 (ref 5–15)
BUN: 78 mg/dL — ABNORMAL HIGH (ref 8–23)
CO2: 28 mmol/L (ref 22–32)
Calcium: 8.5 mg/dL — ABNORMAL LOW (ref 8.9–10.3)
Chloride: 94 mmol/L — ABNORMAL LOW (ref 98–111)
Creatinine, Ser: 1.88 mg/dL — ABNORMAL HIGH (ref 0.44–1.00)
GFR calc Af Amer: 28 mL/min — ABNORMAL LOW (ref >60)
GFR calc non Af Amer: 24 mL/min — ABNORMAL LOW (ref >60)
Glucose, Bld: 108 mg/dL — ABNORMAL HIGH (ref 70–99)
Potassium: 4.6 mmol/L (ref 3.5–5.1)
Sodium: 134 mmol/L — ABNORMAL LOW (ref 135–145)

## 2019-03-12 LAB — HEMOGLOBIN AND HEMATOCRIT, BLOOD
HCT: 26.6 % — ABNORMAL LOW (ref 36.0–46.0)
Hemoglobin: 8 g/dL — ABNORMAL LOW (ref 12.0–15.0)

## 2019-03-12 LAB — CBC
HCT: 23.4 % — ABNORMAL LOW (ref 36.0–46.0)
Hemoglobin: 6.6 g/dL — CL (ref 12.0–15.0)
MCH: 24.4 pg — ABNORMAL LOW (ref 26.0–34.0)
MCHC: 28.2 g/dL — ABNORMAL LOW (ref 30.0–36.0)
MCV: 86.7 fL (ref 80.0–100.0)
Platelets: 292 10*3/uL (ref 150–400)
RBC: 2.7 MIL/uL — ABNORMAL LOW (ref 3.87–5.11)
RDW: 19.8 % — ABNORMAL HIGH (ref 11.5–15.5)
WBC: 7 10*3/uL (ref 4.0–10.5)
nRBC: 0.3 % — ABNORMAL HIGH (ref 0.0–0.2)

## 2019-03-12 LAB — PREPARE RBC (CROSSMATCH)

## 2019-03-12 MED ORDER — SODIUM CHLORIDE 0.9% IV SOLUTION: Freq: Once | INTRAVENOUS | Status: DC

## 2019-03-12 MED ORDER — BISACODYL 5 MG PO TBEC
10.0000 mg | DELAYED_RELEASE_TABLET | Freq: Once | ORAL | Status: AC
  Administered 2019-03-12: 10 mg via ORAL
  Filled 2019-03-12: qty 2

## 2019-03-12 MED ORDER — SODIUM CHLORIDE 0.9 % IV SOLN
510.0000 mg | INTRAVENOUS | Status: DC
  Administered 2019-03-13: 510 mg via INTRAVENOUS
  Filled 2019-03-12: qty 17

## 2019-03-12 MED ORDER — PANTOPRAZOLE SODIUM 40 MG PO TBEC
40.0000 mg | DELAYED_RELEASE_TABLET | Freq: Every day | ORAL | Status: DC
  Administered 2019-03-12 – 2019-03-17 (×5): 40 mg via ORAL
  Filled 2019-03-12 (×7): qty 1

## 2019-03-12 MED ORDER — POLYETHYLENE GLYCOL 3350 17 G PO PACK
34.0000 g | PACK | Freq: Once | ORAL | Status: AC
  Administered 2019-03-13: 34 g via ORAL
  Filled 2019-03-12: qty 2

## 2019-03-12 MED ORDER — SODIUM CHLORIDE 0.9 % IV SOLN
510.0000 mg | INTRAVENOUS | Status: DC
  Filled 2019-03-12: qty 17

## 2019-03-12 NOTE — Progress Notes (Addendum)
PT Cancellation Note  Patient Details Name: [name] MRN: [id number] DOB: [birth year]   Cancelled Treatment:    Reason Eval/Treat Not Completed: Other (comment) Pt sitting on the side of the bed with nurse tech and reports not feeling well. Will follow.  Attempted to see in PM however pt reports still not feeling well and getting lightheaded when she stands up. Will follow.   [name] 03/12/2019, 12:11 PM Marisa Severin, PT, DPT Acute Rehabilitation Services Pager [phone] Office [phone]

## 2019-03-12 NOTE — Plan of Care (Signed)
  Problem: Nutrition: Goal: Adequate nutrition will be maintained Outcome: Completed/Met   Problem: Coping: Goal: Level of anxiety will decrease Outcome: Completed/Met   Problem: Elimination: Goal: Will not experience complications related to bowel motility Outcome: Completed/Met   Problem: Cardiovascular: Goal: Vascular access site(s) Level 0-1 will be maintained Outcome: Completed/Met

## 2019-03-12 NOTE — Progress Notes (Signed)
Pt. Refusing bed alarm.Explained importance of safety measures. Pt verbalized "I will not stand up by myself , I promise".  Pt is so sleepy at this time, sitting at he edge of the bed, bed alarm was turned on, pt.  Got upset. Will monitor/ endorsed  accordingly.

## 2019-03-12 NOTE — Progress Notes (Signed)
Pt is a little unsteady but alert and oriented offered multiple times to put the bed alarm on but  pt refused , pt was educated but still refused.

## 2019-03-12 NOTE — Progress Notes (Signed)
Received pt. BT infusing. Transfuse only 1u PRBC per MD.

## 2019-03-12 NOTE — Progress Notes (Signed)
Daily Rounding Note  03/12/2019, 11:14 AM  LOS: 15 days   Dr. Tawanna Solo requesting GI to revisit patient for persistent anemia.  Patient underwent endoscopy/colonoscopy a couple of weeks ago for investigation of transfusion requiring iron deficiency anemia, Hb 5.8.  Her stool was never hemocculted. 02/28/2019 EGD.  Mild inflammation with erosions, erythema in the gastric antrum, otherwise normal study.  Pathology from biopsies showed slight, chronic inflammation.  No H. pylori, metaplasia, dysplasia, malignancy. 02/28/2019 colonoscopy.  Pandiverticulosis.  Patchy, moderate sigmoid inflammation concerning for ischemia, possibly chronic.  Biopsies of the area confirmed ischemic colitis and associated focal necrosis. Eliquis given 10/25 - 10/5, now discontinued.   Hgbs of 5.8 >> 3 PRBCs >> ~ 8 >> 6.8 >> 1 PRBC >> latest Hgb 8.2 >> 6.6.  Receiving 5th PRBC now.  Day 2 daily iron tablets (taking this PTA).  Received Feraheme for anemia in early 11/2018, no second dose   She is not receiving PPI or H2 blocker.  SUBJECTIVE:   This AM she is newly nauseated and dry heaving, feels awful.  This was not an issue in recent days.  No abdominal pain.  Stool yesterday was dark but looked like stools at home since taking oral iron.   Other issues include LE cellulitis, chronic venous and arterial  insufficiency and severe stasis dermatitis.   GFR on decline from mid 30s to mid 20s,  Corresponding w GFR stage 3 b >> stage 4.     Pt not interested in repeating upper endoscopy or capsule.  Re capsule study, pt does not think she can swallow the capsule pill.    OBJECTIVE:         Vital signs in last 24 hours:    Temp:  [97.4 F (36.3 C)-98.6 F (37 C)] 97.8 F (36.6 C) (11/06 1050) Pulse Rate:  [63-84] 65 (11/06 1050) Resp:  [16-20] 16 (11/06 1050) BP: (88-119)/(39-79) 119/73 (11/06 1050) SpO2:  [92 %-99 %] 98 % (11/06 1050) Weight:  [74.4 kg]  74.4 kg (11/06 0027) Last BM Date: 03/11/19 Filed Weights   03/10/19 0002 03/11/19 0405 03/12/19 0027  Weight: 72.3 kg 71.9 kg 74.4 kg   General: Advanced chronic and moderately acutely ill looking.  Generally uncomfortable, sitting up at the side of the bed and mildly retching but not able to produce any emesis. Heart: RRR. Chest: Greatly diminished breath sounds globally but clear.  No cough.  Slight increased work of breathing. Abdomen: Soft.  Minimal nonfocal tenderness.  Bowel sounds active.  No distention. Extremities: Swelling in both lower extremities.  There is an Ace wrap on the right lower leg.  On the left lower leg is redness/discoloring, swelling, severe stasis dermatitis laterally with extensive crusting of the skin Neuro/Psych: Depressed.  Follows commands.  Able to answer questions.  Not acutely confused.  Intake/Output from previous day: 11/05 0701 - 11/06 0700 In: 980 [P.O.:980] Out: 700 [Urine:700]  Intake/Output this shift: Total I/O In: 350 [P.O.:320; Blood:30] Out: -   Lab Results: Recent Labs    03/10/19 0348 03/10/19 1518 03/11/19 0420 03/12/19 0522  WBC 6.2  --  6.2 7.0  HGB 6.8* 8.2* 7.7* 6.6*  HCT 23.4* 27.9* 26.6* 23.4*  PLT 264  --  293 292   BMET Recent Labs    03/10/19 0348 03/11/19 0420 03/12/19 0522  NA 136 136 134*  K 3.5 5.3* 4.6  CL 96* 95* 94*  CO2 28 30 28   GLUCOSE  96 106* 108*  BUN 55* 66* 78*  CREATININE 1.52* 1.84* 1.88*  CALCIUM 8.8* 9.0 8.5*   LFT No results for input(s): PROT, ALBUMIN, AST, ALT, ALKPHOS, BILITOT, BILIDIR, IBILI in the last 72 hours. PT/INR No results for input(s): LABPROT, INR in the last 72 hours. Hepatitis Panel No results for input(s): HEPBSAG, HCVAB, HEPAIGM, HEPBIGM in the last 72 hours.  Studies/Results: No results found.   Scheduled Meds: . sodium chloride   Intravenous Once  . sodium chloride   Intravenous Once  . sodium chloride   Intravenous Once  . docusate sodium  100 mg Oral BID   . ezetimibe  10 mg Oral Daily  . ferrous sulfate  325 mg Oral Q breakfast  . fluticasone  2 spray Each Nare Daily  . gabapentin  300 mg Oral BID  . hydrocerin   Topical BID  . hydrocortisone cream   Topical BID  . influenza vaccine adjuvanted  0.5 mL Intramuscular Tomorrow-1000  . levothyroxine  25 mcg Oral QAC breakfast  . lidocaine  1 patch Transdermal Q24H  . loratadine  10 mg Oral Daily  . magnesium oxide  400 mg Oral Daily  . pantoprazole  40 mg Oral Q0600  . rosuvastatin  40 mg Oral Daily  . sodium chloride flush  3 mL Intravenous Once  . sodium chloride flush  3 mL Intravenous Q12H  . torsemide  80 mg Oral BID   Continuous Infusions: . sodium chloride 1,000 mL (03/04/19 0957)  . ferumoxytol     PRN Meds:.sodium chloride, acetaminophen, bisacodyl, fentaNYL (SUBLIMAZE) injection, ondansetron **OR** ondansetron (ZOFRAN) IV, oxyCODONE **OR** oxyCODONE   ASSESMENT:   *   Anemia.  Ongoing w need for additional transfusions over last 3 days.  Iron deficient.  Day 2 oral iron.   EGD showed mild gastritis.  Colonoscopy showed ischemic colitis and diverticulosis. TSH ok as of 2 weeks ago  *   Nausea onset today.  No PPI, H2 blocker in use.    *  Chronic AC for hx afib, non-ischemic CM, hx TIA.  Coumadin PTA stopped, reversed and Eliquis restarted/stopped 10/25 - 10/5.       PLAN   *   feraheme infusion now and in 1 week.  Start Protonix 40 mg po/day.     Dr. Tarri Glenn will see patient later today and discuss options.    [name]  03/12/2019, 11:14 AM Phone [phone]

## 2019-03-12 NOTE — Progress Notes (Signed)
Patient ID: [name], female   DOB: 06-29-1933, 85 y.o.   MRN: [id number]     Advanced Heart Failure Rounding Note  PCP-Cardiologist: Virl Axe, MD   Subjective:    Still with left leg pain L>R.  Leg swelling has improved but still present.    Hgb down to 6.6 today, she denies overt bleeding.     Weights not accurate.  Creatinine stable today at 1.88.    RHC Procedural Findings (11/2): Hemodynamics (mmHg) RA mean 13 RV 65/134 PA 63/23 mean 40 PCWP mean 25 with v-waves to 47 Oxygen saturations: PA 59% AO 100% Cardiac Output (Fick) 5.79  Cardiac Index (Fick) 3.18 PVR 2.6 WU  Objective:   Weight Range: 74.4 kg Body mass index is 26.47 kg/m.   Vital Signs:   Temp:  [97.4 F (36.3 C)-98.6 F (37 C)] 97.7 F (36.5 C) (11/06 0716) Pulse Rate:  [63-84] 66 (11/06 0716) Resp:  [17-20] 18 (11/06 0716) BP: (88-116)/(39-79) 109/64 (11/06 0716) SpO2:  [92 %-99 %] 96 % (11/06 0635) Weight:  [74.4 kg] 74.4 kg (11/06 0027) Last BM Date: 03/11/19  Weight change: Filed Weights   03/10/19 0002 03/11/19 0405 03/12/19 0027  Weight: 72.3 kg 71.9 kg 74.4 kg    Intake/Output:   Intake/Output Summary (Last 24 hours) at 03/12/2019 0940 Last data filed at 03/12/2019 0847 Gross per 24 hour  Intake 890 ml  Output 700 ml  Net 190 ml      Physical Exam   General: NAD Neck: JVP 12 cm, no thyromegaly or thyroid nodule.  Lungs: Mild crackles at bases (no change). CV: Nondisplaced PMI.  Heart irregular S1/S2, no S3/S4, 2/6 SEM RUSB.  1+ edema 1/2 to knees bilaterally. Abdomen: Soft, nontender, no hepatosplenomegaly, no distention.  Skin: Intact without lesions or rashes.  Neurologic: Alert and oriented x 3.  Psych: Normal affect. Extremities: Chronic ulcerations left lower leg.  HEENT: Normal.   Telemetry    Afib 80s personally reviewed.   EKG    N/a  Labs    CBC Recent Labs    03/11/19 0420 03/12/19 0522  WBC 6.2 7.0  HGB 7.7* 6.6*  HCT 26.6* 23.4*   MCV 84.2 86.7  PLT 293 751   Basic Metabolic Panel Recent Labs    03/10/19 0348 03/11/19 0420 03/12/19 0522  NA 136 136 134*  K 3.5 5.3* 4.6  CL 96* 95* 94*  CO2 28 30 28   GLUCOSE 96 106* 108*  BUN 55* 66* 78*  CREATININE 1.52* 1.84* 1.88*  CALCIUM 8.8* 9.0 8.5*  MG 2.1  --   --    Liver Function Tests No results for input(s): AST, ALT, ALKPHOS, BILITOT, PROT, ALBUMIN in the last 72 hours. No results for input(s): LIPASE, AMYLASE in the last 72 hours. Cardiac Enzymes No results for input(s): CKTOTAL, CKMB, CKMBINDEX, TROPONINI in the last 72 hours.  BNP: BNP (last 3 results) Recent Labs    11/04/18 1715 01/20/19 1451 02/24/19 2051  BNP 369.3* 313.4* 314.7*    ProBNP (last 3 results) No results for input(s): PROBNP in the last 8760 hours.   D-Dimer No results for input(s): DDIMER in the last 72 hours. Hemoglobin A1C No results for input(s): HGBA1C in the last 72 hours. Fasting Lipid Panel No results for input(s): CHOL, HDL, LDLCALC, TRIG, CHOLHDL, LDLDIRECT in the last 72 hours. Thyroid Function Tests No results for input(s): TSH, T4TOTAL, T3FREE, THYROIDAB in the last 72 hours.  Invalid input(s): FREET3  Other results:  Imaging    No results found.   Medications:     Scheduled Medications: . sodium chloride   Intravenous Once  . sodium chloride   Intravenous Once  . sodium chloride   Intravenous Once  . docusate sodium  100 mg Oral BID  . ezetimibe  10 mg Oral Daily  . ferrous sulfate  325 mg Oral Q breakfast  . fluticasone  2 spray Each Nare Daily  . gabapentin  300 mg Oral BID  . hydrocerin   Topical BID  . hydrocortisone cream   Topical BID  . influenza vaccine adjuvanted  0.5 mL Intramuscular Tomorrow-1000  . levothyroxine  25 mcg Oral QAC breakfast  . lidocaine  1 patch Transdermal Q24H  . loratadine  10 mg Oral Daily  . magnesium oxide  400 mg Oral Daily  . rosuvastatin  40 mg Oral Daily  . sodium chloride flush  3 mL Intravenous  Once  . sodium chloride flush  3 mL Intravenous Q12H  . torsemide  80 mg Oral BID    Infusions: . sodium chloride 1,000 mL (03/04/19 0957)    PRN Medications: sodium chloride, acetaminophen, bisacodyl, fentaNYL (SUBLIMAZE) injection, ondansetron **OR** ondansetron (ZOFRAN) IV, oxyCODONE **OR** oxyCODONE    Patient Profile   85 y/o woman with h/o diastolic HF with R>L symptoms, permanent AF (CHADSVaSC = 8), CKD III-IV with solitary kidney admitted with symptomatic anemia, hgb 5.8 on admit requiring transfusion. GI w/u suggestive of possible ischemic bowel. Warfarin stopped and now on apixaban. Major issue now is significant RHF with 2-3+ edema and with weeping lesions. Has previously refused wraps due to LE pain.   Assessment/Plan   1. Acute on chronic diastolic CHF: Echo in 5/80 with EF 60-65% with mild RV dilation. She has a Cardiomems device.She had remote nonischemic cardiomyopathy with recovery of EF, has [employer] ICD. Suspect that there is significant RV dysfunction. Atrial fibrillation likely contributes, but this appears to be permanent now as she has failed multiple anti-arrhythmics and is reasonably rate-controlled. RHC7/6/20showed mild-moderately elevated PCWP and RA pressure, primarily pulmonary venous hypertension. Prominent v-waves on PCWP tracing may be due to stiff ventricle/diastolic dysfunction as she had only mild MR on recent echo.Repeat RHC 11/2 showed ongoing volume overload.  IV Lasix stopped yesterday and torsemide started last night.  Creatinine stable but BUN higher.  She does appear to have residual volume overload. - With worsening anemia and renal function, will not push diuresis today.  Continue torsemide 80 mg bid.     - Unna booton right, cannot tolerate on left. 2. AKI on CKD stage 3: Creatinine up to 1.88, stable from yesterday. Lasix gtt stopped 11/4, has been restarted on torsemide at 80 mg bid.   3. Pulmonary hypertension: She has pulmonary  venous hypertension.  4. Anemia: She has history of Fe deficiency anemia. Admitted with GI bleeding, colonoscopy suggestive of ischemic colitis (pathology confirmed). Has had transfusions. Also with diverticulosis. Hgb down again today to 6.6 though no obvious bleeding. - GI has been following.  - Transfuse 1 unit PRBCs today.  - With ongoing blood loss, will stop apixaban.  Will keep her off for a couple of weeks then try to restart once bowel has had some time to heal.   5. Atrial fibrillation: Permanent. CVR in the 80s. Transitioned from warfarin to Eliquis with GI bleeding.  - As above, will stop Eliquis for at least a couple of weeks to allow healing.  Hopefully will be able to restart eventually.  6. Cellulitis: Lower legs, completed course of Keflex. 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 However, no "intestinal angina" reported. Seen by Dr. Doren Custard, recommended conservative management unless she were to develop clear symptoms of chronic mesenteric ischemia.  - She is on Crestor.  8. PAD: Bilateral leg pain and ulcerations with chronic ischemic skin changes.  Peripheral arterial dopplers showed significant bilateral PAD.  Seen by Dr. Scot Dock, he was concerned that she may not be able to heal leg wounds.  If worsens, will need CO2 angiography.    Loralie Champagne 03/12/2019 9:40 AM

## 2019-03-12 NOTE — Progress Notes (Signed)
PROGRESS NOTE    [name]  YIR:[id number] DOB: [birth year] DOA: 02/24/2019 PCP: Elby Beck, FNP   Brief Narrative:  Patient is 67 old female with history of atrial fibrillation, status post pacemaker/defibrillator, chronic diastolic CHF, chronic anemia, hypothyroidism, CKD stage III with single functioning kidney who presents to the emergency department with increasing weakness, fall at home, leg swelling and shortness of breath.  Chest x-ray on presentation showed bilateral pleural effusion.  She was found to have bilateral significant lower extremity edema, hemoglobin of 6.2.  She was admitted for the management of acute on chronic CHF exacerbation and started on Lasix drip.  Cardiology following.Underwent right heart catheterization on 03/08/19 which showed volume overload.  She was started on Lasix drip.  Lasix drip has been stopped today and she has been started on torsemide.  Plan to discharge home tomorrow  Assessment & Plan:   Principal Problem:   Acute on chronic diastolic CHF (congestive heart failure), NYHA class 4 (HCC) Active Problems:   Permanent atrial fibrillation   COPD (chronic obstructive pulmonary disease) (HCC)   Hypertension   Chronic kidney disease (CKD), stage IV (severe) (HCC)   PAD (peripheral artery disease) (HCC)   S/p nephrectomy   Pulmonary hypertension (HCC)   Symptomatic anemia   AKI (acute kidney injury) ([place])   Acute on chronic diastolic CHF exacerbation: Presented with volume overload.  Ejection fraction 66-65% as per last echo.  On torsemide 100 mg twice a day at home.  She was  on Lasix drip at 15mg /hr. She was also given few days metalazone.  Bilateral lower extremity edema has improved.  Continue compression wrap ,unna boot on the right.  Cardiology was following.  Lasix drip stopped and she has been started on torsemide 80 mg twice daily.  Left lower extremity cellulitis/chronic venous insufficiency: On Keflex.   Wound care  evaluation done and recommended eucerin cream  Left open to air.  Complains of burning pain on the left lower extremity.  Ordered gabapentin but patient denies to take.  Symptomatic anemia: Hemoglobin again dropped to 6.8 so she has been  transfused with a unit of PRBC.  She is not having any active bleeding. I have requested GI for follow-up .  We will also get FOBT.Eliquis D/ced She was seen by GI earlier  and underwent colonoscopy and suspected  to have possible ischemic colitis on the left colon.   EGD showed mild gastritis, H. pylori negative.  Seen by vascular surgery for possible mesenteric ischemia.  No plan for intervention.    Permanent A. fib: Rate is well controlled.  .  Status post pacemaker/defibrillator.  She was on Eliquis which has been held.  Can restart as an outpatient.  Combined peripheral vascular disease/chronic venous insufficiency: Vascular surgery was consulted for possible mesenteric ischemia.  Also suspected peripheral vascular disease but ABI could not be obtained due to wounds on the left lower extremity.  Vascular surgery recommended follow-up as an outpatient.   There was concern of celiac/SMA stenosis but no postprandial symptoms.  No further treatment plan unless develops clear for symptoms of chronic mesenteric ischemia as per vascular surgery.  Hyperlipidemia: On Crestor  Hypothyroidism: Synthyroid  AKI on CKD stage III: Has a solitary kidney/status post nephrectomy.  Currently kidney function is at baseline.  Hypokalemia: Supplemented and corrected         DVT prophylaxis: Eliquis Code Status: DNR Family Communication: Called daughter [name] on phone on 03/10/19.Will call again today Disposition Plan: Home after  full work up   Consultants: Vascular, cardiology  Procedures: EGD/colonoscopy  Antimicrobials:  Anti-infectives (From admission, onward)   Start     Dose/Rate Route Frequency Ordered Stop   03/04/19 2200  cephALEXin (KEFLEX) capsule 500  mg     500 mg Oral Every 12 hours 03/04/19 1613 03/10/19 1001   03/01/19 1400  ceFAZolin (ANCEF) IVPB 1 g/50 mL premix  Status:  Discontinued     1 g 100 mL/hr over 30 Minutes Intravenous Every 8 hours 03/01/19 1033 03/01/19 1043   03/01/19 1045  ceFAZolin (ANCEF) 0.5 g in dextrose 5 % 100 mL IVPB  Status:  Discontinued     0.5 g 210 mL/hr over 30 Minutes Intravenous Every 12 hours 03/01/19 1043 03/04/19 1613      Subjective:  Patient seen and examined the bedside this morning.  Hemodynamically stable.  Sitting up the side of the bed.   complains of dizziness today.  Denies any frank bleeding or change in the color of the stool.  Objective: Vitals:   03/12/19 0600 03/12/19 0635 03/12/19 0716 03/12/19 1050  BP: 115/62 (!) 101/58 109/64 119/73  Pulse: 63 84 66 65  Resp:  18 18 16   Temp:  97.6 F (36.4 C) 97.7 F (36.5 C) 97.8 F (36.6 C)  TempSrc:  Oral Oral Oral  SpO2:  96%  98%  Weight:      Height:        Intake/Output Summary (Last 24 hours) at 03/12/2019 1108 Last data filed at 03/12/2019 0847 Gross per 24 hour  Intake 890 ml  Output 500 ml  Net 390 ml   Filed Weights   03/10/19 0002 03/11/19 0405 03/12/19 0027  Weight: 72.3 kg 71.9 kg 74.4 kg    Examination:  General exam: Pleasant elderly female, debilitated, deconditioned HEENT:PERRL,Oral mucosa moist, Ear/Nose normal on gross exam Respiratory system: Bilateral equal air entry, normal vesicular breath sounds, no wheezes or crackles  Cardiovascular system: S1 & S2 heard, Afib. No JVD, murmurs, rubs, gallops or clicks.  1-2+  lower extremity edema.has pacemaker Gastrointestinal system: Abdomen is nondistended, soft and nontender. No organomegaly or masses felt. Normal bowel sounds heard. Central nervous system: Alert and oriented. No focal neurological deficits. Extremities: 1-2+ bilateral  lower extremity edema, erythematous changes, mild cellulitic changes on the LLE with skin breakdowns,scabs Skin: no icterus  ,no pallor   Data Reviewed: I have personally reviewed following labs and imaging studies  CBC: Recent Labs  Lab 03/08/19 0645  03/09/19 0400 03/10/19 0348 03/10/19 1518 03/11/19 0420 03/12/19 0522  WBC 7.1  --  6.9 6.2  --  6.2 7.0  HGB 8.3*   < > 7.6* 6.8* 8.2* 7.7* 6.6*  HCT 29.5*   < > 26.9* 23.4* 27.9* 26.6* 23.4*  MCV 82.6  --  82.8 81.5  --  84.2 86.7  PLT 324  --  297 264  --  293 292   < > = values in this interval not displayed.   Basic Metabolic Panel: Recent Labs  Lab 03/08/19 0645  03/08/19 0933 03/08/19 1205 03/09/19 0400 03/10/19 0348 03/11/19 0420 03/12/19 0522  NA 137   < > 136  --  136 136 136 134*  K 3.2*   < > 2.9*  --  3.6 3.5 5.3* 4.6  CL 94*  --   --   --  95* 96* 95* 94*  CO2 28  --   --   --  28 28 30 28   GLUCOSE 90  --   --   --  98 96 106* 108*  BUN 38*  --   --   --  44* 55* 66* 78*  CREATININE 1.59*  --   --   --  1.58* 1.52* 1.84* 1.88*  CALCIUM 9.1  --   --   --  8.9 8.8* 9.0 8.5*  MG  --   --   --  2.1  --  2.1  --   --    < > = values in this interval not displayed.   GFR: Estimated Creatinine Clearance: 23.4 mL/min (A) (by C-G formula based on SCr of 1.88 mg/dL (H)). Liver Function Tests: No results for input(s): AST, ALT, ALKPHOS, BILITOT, PROT, ALBUMIN in the last 168 hours. No results for input(s): LIPASE, AMYLASE in the last 168 hours. No results for input(s): AMMONIA in the last 168 hours. Coagulation Profile: No results for input(s): INR, PROTIME in the last 168 hours. Cardiac Enzymes: No results for input(s): CKTOTAL, CKMB, CKMBINDEX, TROPONINI in the last 168 hours. BNP (last 3 results) No results for input(s): PROBNP in the last 8760 hours. HbA1C: No results for input(s): HGBA1C in the last 72 hours. CBG: Recent Labs  Lab 03/07/19 1207  GLUCAP 83   Lipid Profile: No results for input(s): CHOL, HDL, LDLCALC, TRIG, CHOLHDL, LDLDIRECT in the last 72 hours. Thyroid Function Tests: No results for input(s): TSH,  T4TOTAL, FREET4, T3FREE, THYROIDAB in the last 72 hours. Anemia Panel: No results for input(s): VITAMINB12, FOLATE, FERRITIN, TIBC, IRON, RETICCTPCT in the last 72 hours. Sepsis Labs: No results for input(s): PROCALCITON, LATICACIDVEN in the last 168 hours.  No results found for this or any previous visit (from the past 240 hour(s)).       Radiology Studies: No results found.      Scheduled Meds: . sodium chloride   Intravenous Once  . sodium chloride   Intravenous Once  . sodium chloride   Intravenous Once  . docusate sodium  100 mg Oral BID  . ezetimibe  10 mg Oral Daily  . ferrous sulfate  325 mg Oral Q breakfast  . fluticasone  2 spray Each Nare Daily  . gabapentin  300 mg Oral BID  . hydrocerin   Topical BID  . hydrocortisone cream   Topical BID  . influenza vaccine adjuvanted  0.5 mL Intramuscular Tomorrow-1000  . levothyroxine  25 mcg Oral QAC breakfast  . lidocaine  1 patch Transdermal Q24H  . loratadine  10 mg Oral Daily  . magnesium oxide  400 mg Oral Daily  . rosuvastatin  40 mg Oral Daily  . sodium chloride flush  3 mL Intravenous Once  . sodium chloride flush  3 mL Intravenous Q12H  . torsemide  80 mg Oral BID   Continuous Infusions: . sodium chloride 1,000 mL (03/04/19 0957)     LOS: 15 days    Time spent: 25 mins.More than 50% of that time was spent in counseling and/or coordination of care.      Shelly Coss, MD [facility] Pager [phone]  If 7PM-7AM, please contact night-coverage [email or web address] Password [email or web address] 03/12/2019, 11:08 AM

## 2019-03-13 DIAGNOSIS — K921 Melena: Secondary | ICD-10-CM

## 2019-03-13 LAB — BASIC METABOLIC PANEL
Anion gap: 13 (ref 5–15)
BUN: 85 mg/dL — ABNORMAL HIGH (ref 8–23)
CO2: 26 mmol/L (ref 22–32)
Calcium: 8.4 mg/dL — ABNORMAL LOW (ref 8.9–10.3)
Chloride: 92 mmol/L — ABNORMAL LOW (ref 98–111)
Creatinine, Ser: 1.84 mg/dL — ABNORMAL HIGH (ref 0.44–1.00)
GFR calc Af Amer: 29 mL/min — ABNORMAL LOW (ref >60)
GFR calc non Af Amer: 25 mL/min — ABNORMAL LOW (ref >60)
Glucose, Bld: 105 mg/dL — ABNORMAL HIGH (ref 70–99)
Potassium: 4.1 mmol/L (ref 3.5–5.1)
Sodium: 131 mmol/L — ABNORMAL LOW (ref 135–145)

## 2019-03-13 LAB — CBC
HCT: 22.9 % — ABNORMAL LOW (ref 36.0–46.0)
Hemoglobin: 6.8 g/dL — CL (ref 12.0–15.0)
MCH: 25.5 pg — ABNORMAL LOW (ref 26.0–34.0)
MCHC: 29.7 g/dL — ABNORMAL LOW (ref 30.0–36.0)
MCV: 85.8 fL (ref 80.0–100.0)
Platelets: 252 10*3/uL (ref 150–400)
RBC: 2.67 MIL/uL — ABNORMAL LOW (ref 3.87–5.11)
RDW: 21 % — ABNORMAL HIGH (ref 11.5–15.5)
WBC: 7.3 10*3/uL (ref 4.0–10.5)
nRBC: 0.4 % — ABNORMAL HIGH (ref 0.0–0.2)

## 2019-03-13 LAB — HEMOGLOBIN AND HEMATOCRIT, BLOOD
HCT: 27.2 % — ABNORMAL LOW (ref 36.0–46.0)
Hemoglobin: 8.2 g/dL — ABNORMAL LOW (ref 12.0–15.0)

## 2019-03-13 LAB — PREPARE RBC (CROSSMATCH)

## 2019-03-13 MED ORDER — SODIUM CHLORIDE 0.9% IV SOLUTION
Freq: Once | INTRAVENOUS | Status: AC
  Administered 2019-03-13: 15:00:00 via INTRAVENOUS

## 2019-03-13 NOTE — Plan of Care (Signed)
  Problem: Education: Goal: Knowledge of General Education information will improve Description: Including pain rating scale, medication(s)/side effects and non-pharmacologic comfort measures Outcome: Progressing   Problem: Health Behavior/Discharge Planning: Goal: Ability to manage health-related needs will improve Outcome: Progressing   Problem: Clinical Measurements: Goal: Ability to maintain clinical measurements within normal limits will improve Outcome: Progressing Goal: Will remain free from infection Outcome: Progressing Goal: Diagnostic test results will improve Outcome: Progressing Goal: Respiratory complications will improve Outcome: Progressing Goal: Cardiovascular complication will be avoided Outcome: Progressing   Problem: Activity: Goal: Risk for activity intolerance will decrease Outcome: Progressing   Problem: Elimination: Goal: Will not experience complications related to urinary retention Outcome: Progressing   Problem: Pain Managment: Goal: General experience of comfort will improve Outcome: Progressing   Problem: Safety: Goal: Ability to remain free from injury will improve Outcome: Progressing   Problem: Skin Integrity: Goal: Risk for impaired skin integrity will decrease Outcome: Progressing   Problem: Education: Goal: Ability to demonstrate management of disease process will improve Outcome: Progressing Goal: Ability to verbalize understanding of medication therapies will improve Outcome: Progressing Goal: Individualized Educational Video(s) Outcome: Progressing   Problem: Activity: Goal: Capacity to carry out activities will improve Outcome: Progressing   Problem: Cardiac: Goal: Ability to achieve and maintain adequate cardiopulmonary perfusion will improve Outcome: Progressing   Problem: Education: Goal: Understanding of CV disease, CV risk reduction, and recovery process will improve Outcome: Progressing Goal: Individualized  Educational Video(s) Outcome: Progressing   Problem: Activity: Goal: Ability to return to baseline activity level will improve Outcome: Progressing   Problem: Cardiovascular: Goal: Ability to achieve and maintain adequate cardiovascular perfusion will improve Outcome: Progressing   Problem: Health Behavior/Discharge Planning: Goal: Ability to safely manage health-related needs after discharge will improve Outcome: Progressing

## 2019-03-13 NOTE — Progress Notes (Signed)
Daily Rounding Note  03/13/2019, 8:39 AM  LOS: 16 days   SUBJECTIVE:   Chief complaint: persistent anemia    Patient's nausea has pretty much resolved.  She was able to tolerate solid food this morning.  Overall feels better but still kind of tired and weak.  OBJECTIVE:         Vital signs in last 24 hours:    Temp:  [97.8 F (36.6 C)-98.7 F (37.1 C)] 98.3 F (36.8 C) (11/07 0457) Pulse Rate:  [62-81] 81 (11/07 0457) Resp:  [16-18] 18 (11/07 0457) BP: (92-152)/(41-92) 92/76 (11/07 0457) SpO2:  [91 %-98 %] 91 % (11/07 0457) Last BM Date: 03/12/19 Filed Weights   03/10/19 0002 03/11/19 0405 03/12/19 0027  Weight: 72.3 kg 71.9 kg 74.4 kg   General: Looks chronically ill, frail. Heart: RRR.  Systolic murmur. Chest: Diminished breath sounds but clear bilaterally.  No labored breathing or cough Abdomen: No tenderness no distention.  Active bowel sounds Extremities: Bilateral lower extremity edema with stasis dermatitis. Neuro/Psych: Cooperative, pleasant.  Not confused  Intake/Output from previous day: 11/06 0701 - 11/07 0700 In: 0630 [P.O.:1236; Blood:345] Out: 1600 [Urine:1600]  Intake/Output this shift: No intake/output data recorded.  Lab Results: Recent Labs    03/11/19 0420 03/12/19 0522 03/12/19 1228 03/13/19 0416  WBC 6.2 7.0  --  7.3  HGB 7.7* 6.6* 8.0* 6.8*  HCT 26.6* 23.4* 26.6* 22.9*  PLT 293 292  --  252   BMET Recent Labs    03/11/19 0420 03/12/19 0522 03/13/19 0416  NA 136 134* 131*  K 5.3* 4.6 4.1  CL 95* 94* 92*  CO2 30 28 26   GLUCOSE 106* 108* 105*  BUN 66* 78* 85*  CREATININE 1.84* 1.88* 1.84*  CALCIUM 9.0 8.5* 8.4*    Studies/Results: No results found.   Scheduled Meds: . sodium chloride   Intravenous Once  . sodium chloride   Intravenous Once  . sodium chloride   Intravenous Once  . sodium chloride   Intravenous Once  . docusate sodium  100 mg Oral BID  . ezetimibe   10 mg Oral Daily  . ferrous sulfate  325 mg Oral Q breakfast  . fluticasone  2 spray Each Nare Daily  . gabapentin  300 mg Oral BID  . hydrocerin   Topical BID  . hydrocortisone cream   Topical BID  . influenza vaccine adjuvanted  0.5 mL Intramuscular Tomorrow-1000  . levothyroxine  25 mcg Oral QAC breakfast  . lidocaine  1 patch Transdermal Q24H  . loratadine  10 mg Oral Daily  . magnesium oxide  400 mg Oral Daily  . pantoprazole  40 mg Oral Q0600  . polyethylene glycol  34 g Oral Once  . rosuvastatin  40 mg Oral Daily  . sodium chloride flush  3 mL Intravenous Once  . sodium chloride flush  3 mL Intravenous Q12H  . torsemide  80 mg Oral BID   Continuous Infusions: . sodium chloride 1,000 mL (03/04/19 0957)  . ferumoxytol     PRN Meds:.sodium chloride, acetaminophen, bisacodyl, fentaNYL (SUBLIMAZE) injection, ondansetron **OR** ondansetron (ZOFRAN) IV, oxyCODONE **OR** oxyCODONE   ASSESMENT:   *   Anemia.  Ongoing w need for additional transfusions in recent days.  Iron deficient.  Day 3 oral iron. 02/28/19 EGD showed mild gastritis.  Colonoscopy showed ischemic colitis and diverticulosis. TSH ok as of 2 weeks ago Did not have sustained bump of Hgb post transfusion  yest AM.  Hgb 6.6 >> 8 >> 6.8 over 24 hours.   Feraheme infusion 11/6.  *   Nausea.  Improved. PPI was restarted 11/6, had not been in use for bulk of admission.    *  Chronic AC for hx afib, non-ischemic CM, hx TIA.  Coumadin PTA stopped, reversed and Eliquis restarted/stopped 10/25 - 10/5.       PLAN   *  Capsule endoscopy set for tmrw.  Clears at dinner, dble dose Miralex tonight for "prep".  Patient still on board to give the capsule endoscopy and attempt.  *     Defer to decision re: additional blood transfusion to hospitalist.    Azucena Freed  03/13/2019, 8:39 AM Phone [phone]

## 2019-03-13 NOTE — Progress Notes (Addendum)
CRITICAL VALUE STICKER  CRITICAL VALUE: HGB 6.8  RECEIVER : LYN RN   DATE & TIME NOTIFIED: 03/13/19 0416  MESSENGER terran  MD NOTIFIED: NP blount  TIME OF NOTIFICATION:0445  RESPONSE: awaiting

## 2019-03-13 NOTE — Progress Notes (Signed)
Reported that ot had 48 runs of Vtach no symptoms or complaints  Took vs  BP (!) 94/41   Pulse 70   Temp 98.6 F (37 C) (Oral)   Resp 16   Ht 5\' 6"  (1.676 m)   Wt 74.4 kg   SpO2 93%   BMI 26.47 kg/m .  MD notified  Pt asymptomatic and was asleep during the runs.

## 2019-03-13 NOTE — Progress Notes (Signed)
PROGRESS NOTE    [name]  VQM:[id number] DOB: [birth year] DOA: 02/24/2019 PCP: Elby Beck, FNP   Brief Narrative:  Patient is 61 old female with history of atrial fibrillation, status post pacemaker/defibrillator, chronic diastolic CHF, chronic anemia, hypothyroidism, CKD stage III with single functioning kidney who presents to the emergency department with increasing weakness, fall at home, leg swelling and shortness of breath.  Chest x-ray on presentation showed bilateral pleural effusion.  She was found to have bilateral significant lower extremity edema, hemoglobin of 6.2.  She was admitted for the management of acute on chronic CHF exacerbation and started on Lasix drip.  Cardiology following.Underwent right heart catheterization on 03/08/19 which showed volume overload.  She was started on Lasix drip.  Lasix drip has been stopped and she has been started on torsemide. Discharge planning canceled due to persistent drop in the hemoglobin.  Hemoglobin dropped again today to 6.8.  GI following and planning for capsule endoscopy versus push enteroscopy.   Assessment & Plan:   Principal Problem:   Acute on chronic diastolic CHF (congestive heart failure), NYHA class 4 (HCC) Active Problems:   Permanent atrial fibrillation   COPD (chronic obstructive pulmonary disease) (HCC)   Hypertension   Chronic kidney disease (CKD), stage IV (severe) (HCC)   PAD (peripheral artery disease) (HCC)   S/p nephrectomy   Pulmonary hypertension (HCC)   Symptomatic anemia   AKI (acute kidney injury) ([place])   Acute on chronic diastolic CHF exacerbation: Presented with volume overload.  Ejection fraction 66-65% as per last echo.  On torsemide 100 mg twice a day at home.  She was  on Lasix drip at 15mg /hr. She was also given few days metalazone.  Bilateral lower extremity edema has improved.  Continue compression wrap ,unna boot on the right.  Cardiology was following.  Lasix drip stopped and she  has been started on torsemide 80 mg twice daily.  Left lower extremity cellulitis/chronic venous insufficiency: On Keflex.   Wound care evaluation done and recommended eucerin cream  Left open to air.  Complains of burning pain on the left lower extremity.  Continue gabapentin.  Symptomatic anemia: Hemoglobin again dropped to 6.8 so she will be   transfused with a unit of PRBC today.  She is not having any active bleeding.  GI following and planning for capsule endoscopy versus push enteroscopy.  Patient was still thinking on it but I have advised her to go for the procedure and she agrees. We will also get FOBT.Eliquis D/ced She was seen by GI earlier  and underwent colonoscopy and suspected  to have possible ischemic colitis on the left colon.   EGD showed mild gastritis, H. pylori negative.  Seen by vascular surgery for possible mesenteric ischemia.  No plan for intervention.    Permanent A. fib: Rate is well controlled.  .  Status post pacemaker/defibrillator.  She was on Eliquis which has been held.  Can restart as an outpatient.  Combined peripheral vascular disease/chronic venous insufficiency: Vascular surgery was consulted for possible mesenteric ischemia.  Also suspected peripheral vascular disease but ABI could not be obtained due to wounds on the left lower extremity.  Vascular surgery recommended follow-up as an outpatient.   There was concern of celiac/SMA stenosis but no postprandial symptoms.  No further treatment plan unless develops clear for symptoms of chronic mesenteric ischemia as per vascular surgery.  Hyperlipidemia: On Crestor  Hypothyroidism: Synthyroid  AKI on CKD stage III: Has a solitary kidney/status post nephrectomy.  Currently kidney function is at baseline.  Hypokalemia: Supplemented         DVT prophylaxis: SCD Code Status: DNR Family Communication: Called daughter [name] on phone on 03/10/19.Will continue to communicate Disposition Plan: Home after full  work up   Consultants: Vascular, cardiology  Procedures: EGD/colonoscopy  Antimicrobials:  Anti-infectives (From admission, onward)   Start     Dose/Rate Route Frequency Ordered Stop   03/04/19 2200  cephALEXin (KEFLEX) capsule 500 mg     500 mg Oral Every 12 hours 03/04/19 1613 03/10/19 1001   03/01/19 1400  ceFAZolin (ANCEF) IVPB 1 g/50 mL premix  Status:  Discontinued     1 g 100 mL/hr over 30 Minutes Intravenous Every 8 hours 03/01/19 1033 03/01/19 1043   03/01/19 1045  ceFAZolin (ANCEF) 0.5 g in dextrose 5 % 100 mL IVPB  Status:  Discontinued     0.5 g 210 mL/hr over 30 Minutes Intravenous Every 12 hours 03/01/19 1043 03/04/19 1613      Subjective:  Patient seen and examined the bedside this morning.  Hemodynamically stable.  Sitting at edge of bed.  Dizziness and left leg pain is better.  She is very frustrated to know that her hemoglobin again dropped to 6.8.  Denies any active bleeding but says her stools are dark. she was initially denying for the procedure offered by GI but has agreed now and willing to proceed.  Objective: Vitals:   03/12/19 2111 03/13/19 0024 03/13/19 0147 03/13/19 0457  BP: (!) 105/92 (!) 94/41 (!) 97/45 92/76  Pulse: 64 70 62 81  Resp: 16   18  Temp: 98.6 F (37 C)   98.3 F (36.8 C)  TempSrc: Oral   Oral  SpO2: 91% 93%  91%  Weight:      Height:        Intake/Output Summary (Last 24 hours) at 03/13/2019 1028 Last data filed at 03/13/2019 0857 Gross per 24 hour  Intake 1471 ml  Output 1600 ml  Net -129 ml   Filed Weights   03/10/19 0002 03/11/19 0405 03/12/19 0027  Weight: 72.3 kg 71.9 kg 74.4 kg    Examination:  General exam: Pleasant elderly female, debilitated, deconditioned HEENT:PERRL,Oral mucosa moist, Ear/Nose normal on gross exam Respiratory system: Bilateral equal air entry, normal vesicular breath sounds, no wheezes or crackles  Cardiovascular system: S1 & S2 heard, Afib. No JVD, murmurs, rubs, gallops or clicks.  1-2+   lower extremity edema.has pacemaker Gastrointestinal system: Abdomen is nondistended, soft and nontender. No organomegaly or masses felt. Normal bowel sounds heard. Central nervous system: Alert and oriented. No focal neurological deficits. Extremities: 1-2+ bilateral  lower extremity edema, erythematous changes, mild cellulitic changes on the LLE with skin breakdowns,scabs Skin: no icterus ,no pallor   Data Reviewed: I have personally reviewed following labs and imaging studies  CBC: Recent Labs  Lab 03/09/19 0400 03/10/19 0348 03/10/19 1518 03/11/19 0420 03/12/19 0522 03/12/19 1228 03/13/19 0416  WBC 6.9 6.2  --  6.2 7.0  --  7.3  HGB 7.6* 6.8* 8.2* 7.7* 6.6* 8.0* 6.8*  HCT 26.9* 23.4* 27.9* 26.6* 23.4* 26.6* 22.9*  MCV 82.8 81.5  --  84.2 86.7  --  85.8  PLT 297 264  --  293 292  --  657   Basic Metabolic Panel: Recent Labs  Lab 03/08/19 1205 03/09/19 0400 03/10/19 0348 03/11/19 0420 03/12/19 0522 03/13/19 0416  NA  --  136 136 136 134* 131*  K  --  3.6  3.5 5.3* 4.6 4.1  CL  --  95* 96* 95* 94* 92*  CO2  --  28 28 30 28 26   GLUCOSE  --  98 96 106* 108* 105*  BUN  --  44* 55* 66* 78* 85*  CREATININE  --  1.58* 1.52* 1.84* 1.88* 1.84*  CALCIUM  --  8.9 8.8* 9.0 8.5* 8.4*  MG 2.1  --  2.1  --   --   --    GFR: Estimated Creatinine Clearance: 23.9 mL/min (A) (by C-G formula based on SCr of 1.84 mg/dL (H)). Liver Function Tests: No results for input(s): AST, ALT, ALKPHOS, BILITOT, PROT, ALBUMIN in the last 168 hours. No results for input(s): LIPASE, AMYLASE in the last 168 hours. No results for input(s): AMMONIA in the last 168 hours. Coagulation Profile: No results for input(s): INR, PROTIME in the last 168 hours. Cardiac Enzymes: No results for input(s): CKTOTAL, CKMB, CKMBINDEX, TROPONINI in the last 168 hours. BNP (last 3 results) No results for input(s): PROBNP in the last 8760 hours. HbA1C: No results for input(s): HGBA1C in the last 72  hours. CBG: Recent Labs  Lab 03/07/19 1207  GLUCAP 83   Lipid Profile: No results for input(s): CHOL, HDL, LDLCALC, TRIG, CHOLHDL, LDLDIRECT in the last 72 hours. Thyroid Function Tests: No results for input(s): TSH, T4TOTAL, FREET4, T3FREE, THYROIDAB in the last 72 hours. Anemia Panel: No results for input(s): VITAMINB12, FOLATE, FERRITIN, TIBC, IRON, RETICCTPCT in the last 72 hours. Sepsis Labs: No results for input(s): PROCALCITON, LATICACIDVEN in the last 168 hours.  No results found for this or any previous visit (from the past 240 hour(s)).       Radiology Studies: No results found.      Scheduled Meds:  sodium chloride   Intravenous Once   sodium chloride   Intravenous Once   sodium chloride   Intravenous Once   sodium chloride   Intravenous Once   docusate sodium  100 mg Oral BID   ezetimibe  10 mg Oral Daily   ferrous sulfate  325 mg Oral Q breakfast   fluticasone  2 spray Each Nare Daily   gabapentin  300 mg Oral BID   hydrocerin   Topical BID   hydrocortisone cream   Topical BID   influenza vaccine adjuvanted  0.5 mL Intramuscular Tomorrow-1000   levothyroxine  25 mcg Oral QAC breakfast   lidocaine  1 patch Transdermal Q24H   loratadine  10 mg Oral Daily   magnesium oxide  400 mg Oral Daily   pantoprazole  40 mg Oral Q0600   polyethylene glycol  34 g Oral Once   rosuvastatin  40 mg Oral Daily   sodium chloride flush  3 mL Intravenous Once   sodium chloride flush  3 mL Intravenous Q12H   torsemide  80 mg Oral BID   Continuous Infusions:  sodium chloride 1,000 mL (03/04/19 0957)   ferumoxytol       LOS: 16 days    Time spent: 25 mins.More than 50% of that time was spent in counseling and/or coordination of care.      Shelly Coss, MD [facility] Pager [phone]  If 7PM-7AM, please contact night-coverage [email or web address] Password [facility] 03/13/2019, 10:28 AM

## 2019-03-13 NOTE — Progress Notes (Signed)
Progress Note  Patient Name: [name] Date of Encounter: 03/13/2019  Primary Cardiologist: Virl Axe, MD  Subjective   Somewhat somnolent this morning, has not had breakfast yet.  No abdominal pain, but has had some nausea.  No shortness of breath at rest.  Inpatient Medications    Scheduled Meds: . sodium chloride   Intravenous Once  . sodium chloride   Intravenous Once  . sodium chloride   Intravenous Once  . docusate sodium  100 mg Oral BID  . ezetimibe  10 mg Oral Daily  . ferrous sulfate  325 mg Oral Q breakfast  . fluticasone  2 spray Each Nare Daily  . gabapentin  300 mg Oral BID  . hydrocerin   Topical BID  . hydrocortisone cream   Topical BID  . influenza vaccine adjuvanted  0.5 mL Intramuscular Tomorrow-1000  . levothyroxine  25 mcg Oral QAC breakfast  . lidocaine  1 patch Transdermal Q24H  . loratadine  10 mg Oral Daily  . magnesium oxide  400 mg Oral Daily  . pantoprazole  40 mg Oral Q0600  . polyethylene glycol  34 g Oral Once  . rosuvastatin  40 mg Oral Daily  . sodium chloride flush  3 mL Intravenous Once  . sodium chloride flush  3 mL Intravenous Q12H  . torsemide  80 mg Oral BID   Continuous Infusions: . sodium chloride 1,000 mL (03/04/19 0957)  . ferumoxytol     PRN Meds: sodium chloride, acetaminophen, bisacodyl, fentaNYL (SUBLIMAZE) injection, ondansetron **OR** ondansetron (ZOFRAN) IV, oxyCODONE **OR** oxyCODONE   Vital Signs    Vitals:   03/12/19 2111 03/13/19 0024 03/13/19 0147 03/13/19 0457  BP: (!) 105/92 (!) 94/41 (!) 97/45 92/76  Pulse: 64 70 62 81  Resp: 16   18  Temp: 98.6 F (37 C)   98.3 F (36.8 C)  TempSrc: Oral   Oral  SpO2: 91% 93%  91%  Weight:      Height:        Intake/Output Summary (Last 24 hours) at 03/13/2019 0813 Last data filed at 03/13/2019 0400 Gross per 24 hour  Intake 1551 ml  Output 1600 ml  Net -49 ml   Filed Weights   03/10/19 0002 03/11/19 0405 03/12/19 0027  Weight: 72.3 kg 71.9 kg  74.4 kg    Telemetry    Atrial fibrillation with intermittent ventricular pacing.  Personally reviewed.  Physical Exam   GEN:  Chronically ill-appearing elderly woman, no acute distress.   Neck: No JVD. Cardiac:  Irregularly irregular, 2/6 systolic murmur, no gallop.  Respiratory: Nonlabored. Clear to auscultation bilaterally. GI: Soft, nontender, bowel sounds present. MS:  Bilateral leg edema with chronic ulcerations left lower leg.Marland Kitchen Neuro:  Nonfocal. Psych: Somewhat somnolent this morning.  Labs    Chemistry Recent Labs  Lab 03/11/19 0420 03/12/19 0522 03/13/19 0416  NA 136 134* 131*  K 5.3* 4.6 4.1  CL 95* 94* 92*  CO2 30 28 26   GLUCOSE 106* 108* 105*  BUN 66* 78* 85*  CREATININE 1.84* 1.88* 1.84*  CALCIUM 9.0 8.5* 8.4*  GFRNONAA 25* 24* 25*  GFRAA 29* 28* 29*  ANIONGAP 11 12 13      Hematology Recent Labs  Lab 03/11/19 0420 03/12/19 0522 03/12/19 1228 03/13/19 0416  WBC 6.2 7.0  --  7.3  RBC 3.16* 2.70*  --  2.67*  HGB 7.7* 6.6* 8.0* 6.8*  HCT 26.6* 23.4* 26.6* 22.9*  MCV 84.2 86.7  --  85.8  MCH 24.4* 24.4*  --  25.5*  MCHC 28.9* 28.2*  --  29.7*  RDW 18.8* 19.8*  --  21.0*  PLT 293 292  --  252    Cardiac Enzymes Recent Labs  Lab 02/24/19 2051 02/25/19 0002  TROPONINIHS 74* 70*    Radiology    No results found.  Cardiac Studies   Echocardiogram 10/06/2018:  1. The left ventricle has normal systolic function with an ejection fraction of 60-65%. The cavity size was normal. Left ventricular diastolic function could not be evaluated due to nondiagnostic images.  2. The right ventricle has normal systolic function. The cavity was mildly enlarged. There is moderately increased right ventricular wall thickness. Right ventricular systolic pressure is severely elevated with an estimated pressure of 93.2 mmHg.  3. Left atrial size was mildly dilated.  4. The mitral valve is degenerative. Mild thickening of the mitral valve leaflet.  5. The  tricuspid valve is not well visualized. Tricuspid valve regurgitation is mild-moderate.  6. The aortic valve was not well visualized. Mild thickening of the aortic valve. Aortic valve regurgitation is trivial by color flow Doppler.  7. The inferior vena cava was dilated in size with <50% respiratory variability.  Heart catheterization 03/08/2019: 1. Elevated right and left heart filling pressures.  2. Pulmonary venous hypertension.  3. Prominent V-waves in PCWP tracing.  4. Preserved cardiac output.   RA mean 13 RV 65/134 PA 63/23 mean 40 PCWP mean 25 with v-waves to 47  Oxygen saturations: PA 59% AO 100%  Cardiac Output (Fick) 5.79  Cardiac Index (Fick) 3.18 PVR 2.6 WU  Patient Profile     85 y.o. female with a history of diastolic HF with R>L symptoms, permanent AF (CHADSVaSC = 8), CKD III-IV with solitary kidney admitted with symptomatic anemia, hgb 5.8 on admit requiring transfusion. GI w/u suggestive of possible ischemic bowel. Warfarin stopped and now on apixaban. Major issue now is significant RHF with 2-3+ edema and with weeping lesions. Has previously refused wraps due to LE pain.   Assessment & Plan    1.  Acute on chronic diastolic heart failure, LVEF 60 to 65% (has prior history of nonischemic cardiomyopathy with normalization of LVEF, [employer] ICD).  She has associated pulmonary venous hypertension with recent right heart catheterization numbers outlined above.  Currently on oral Demadex for diuresis and renal function has stabilized (previously on Lasix drip).  Intake and output fairly even last 24 hours (although collection incomplete).  2.  Acute on chronic kidney injury with CKD stage IIIb at baseline.  Follow-up creatinine 1.84 down from 1.88.  3.  Permanent atrial fibrillation.  Eliquis currently held in the setting of GI bleed. Not requiring AV nodal blockers at this time for heart rate control.  She has a Medtronic ICD in place with intermittent ventricular  pacing.  4.  Mixed hyperlipidemia, on Crestor and Zetia.  5.  Both mesenteric vascular disease and PAD, followed by [facility].  6.  Symptomatic anemia with GI bleed and evidence of ischemic colitis.  Hemoglobin down to 6.8 with follow-up PRBC transfusion pending per primary team.  Current cardiac regimen is limited, continue to hold anticoagulation.  No change in present dose of Demadex.  Follow urine output and BMET.  She has not required AV nodal blocker for heart rate control of atrial fibrillation.  Signed, Rozann Lesches, MD  03/13/2019, 8:13 AM

## 2019-03-13 NOTE — Progress Notes (Signed)
Notified MD for a second time, no new orders. Pt is pale and complaining of nausea , Zofran was administered.  Blood bank also was notified however they said the blood previously ordered was no longer available.

## 2019-03-13 NOTE — Plan of Care (Signed)
  Problem: Education: Goal: Knowledge of General Education information will improve Description: Including pain rating scale, medication(s)/side effects and non-pharmacologic comfort measures Outcome: Progressing   Problem: Health Behavior/Discharge Planning: Goal: Ability to manage health-related needs will improve Outcome: Progressing   Problem: Safety: Goal: Ability to remain free from injury will improve Outcome: Progressing   

## 2019-03-13 NOTE — Progress Notes (Signed)
CRITICAL VALUE ALERT  Critical Value: Hgb 6.8  Date & Time Notied:  03/13/19 0730  Provider Notified: Dr. Tawanna Solo  Orders Received/Actions taken: Awaiting call back

## 2019-03-14 ENCOUNTER — Encounter: Payer: Self-pay | Admitting: Internal Medicine

## 2019-03-14 LAB — BASIC METABOLIC PANEL
Anion gap: 12 (ref 5–15)
BUN: 87 mg/dL — ABNORMAL HIGH (ref 8–23)
CO2: 31 mmol/L (ref 22–32)
Calcium: 8.6 mg/dL — ABNORMAL LOW (ref 8.9–10.3)
Chloride: 86 mmol/L — ABNORMAL LOW (ref 98–111)
Creatinine, Ser: 2.11 mg/dL — ABNORMAL HIGH (ref 0.44–1.00)
GFR calc Af Amer: 24 mL/min — ABNORMAL LOW (ref >60)
GFR calc non Af Amer: 21 mL/min — ABNORMAL LOW (ref >60)
Glucose, Bld: 102 mg/dL — ABNORMAL HIGH (ref 70–99)
Potassium: 3.9 mmol/L (ref 3.5–5.1)
Sodium: 129 mmol/L — ABNORMAL LOW (ref 135–145)

## 2019-03-14 LAB — CBC
HCT: 26.8 % — ABNORMAL LOW (ref 36.0–46.0)
Hemoglobin: 8.1 g/dL — ABNORMAL LOW (ref 12.0–15.0)
MCH: 26.5 pg (ref 26.0–34.0)
MCHC: 30.2 g/dL (ref 30.0–36.0)
MCV: 87.6 fL (ref 80.0–100.0)
Platelets: 258 10*3/uL (ref 150–400)
RBC: 3.06 MIL/uL — ABNORMAL LOW (ref 3.87–5.11)
RDW: 21.2 % — ABNORMAL HIGH (ref 11.5–15.5)
WBC: 6.4 10*3/uL (ref 4.0–10.5)
nRBC: 0.6 % — ABNORMAL HIGH (ref 0.0–0.2)

## 2019-03-14 MED ORDER — TORSEMIDE 20 MG PO TABS
60.0000 mg | ORAL_TABLET | Freq: Two times a day (BID) | ORAL | Status: DC
  Administered 2019-03-14 – 2019-03-16 (×5): 60 mg via ORAL
  Filled 2019-03-14 (×5): qty 3

## 2019-03-14 MED ORDER — FERROUS SULFATE 325 (65 FE) MG PO TABS
325.0000 mg | ORAL_TABLET | Freq: Every day | ORAL | Status: DC
  Administered 2019-03-16 – 2019-03-17 (×2): 325 mg via ORAL
  Filled 2019-03-14 (×2): qty 1

## 2019-03-14 MED ORDER — POLYETHYLENE GLYCOL 3350 17 G PO PACK
34.0000 g | PACK | Freq: Once | ORAL | Status: AC
  Administered 2019-03-14: 34 g via ORAL
  Filled 2019-03-14: qty 2

## 2019-03-14 MED ORDER — TORSEMIDE 20 MG PO TABS: 60.0000 mg | ORAL_TABLET | Freq: Two times a day (BID) | ORAL | Status: DC

## 2019-03-14 NOTE — Progress Notes (Addendum)
[name]  IWL:[id number] DOB: [birth year] DOA: 02/24/2019 PCP: Elby Beck, FNP   Brief Narrative:  Patient is 33 old female with history of atrial fibrillation, status post pacemaker/defibrillator, chronic diastolic CHF, chronic anemia, hypothyroidism, CKD stage III with single functioning kidney who presents to the emergency department with increasing weakness, fall at home, leg swelling and shortness of breath.  Chest x-ray on presentation showed bilateral pleural effusion.  She was found to have bilateral significant lower extremity edema, hemoglobin of 6.2.  She was admitted for the management of acute on chronic CHF exacerbation and started on Lasix drip.  Cardiology following.Underwent right heart catheterization on 03/08/19 which showed volume overload.  She was started on Lasix drip.  Lasix drip has been stopped and she has been started on torsemide. Discharge planning canceled due to persistent drop in the hemoglobin.    GI following and planning for capsule endoscopy .   Assessment & Plan:   Principal Problem:   Acute on chronic diastolic CHF (congestive heart failure), NYHA class 4 (HCC) Active Problems:   Permanent atrial fibrillation   COPD (chronic obstructive pulmonary disease) (HCC)   Hypertension   Chronic kidney disease (CKD), stage IV (severe) (HCC)   PAD (peripheral artery disease) (HCC)   S/p nephrectomy   Pulmonary hypertension (HCC)   Symptomatic anemia   AKI (acute kidney injury) ([place])   Melena   Acute on chronic diastolic CHF exacerbation: Presented with volume overload.  Ejection fraction 66-65% as per last echo.  On torsemide 100 mg twice a day at home.  She was  on Lasix drip at 15mg /hr. She was also given few days metalazone.  Bilateral lower extremity edema has improved.  Continue compression wrap ,unna boot on the right.  Cardiology was following.  Lasix drip stopped and she has been started on torsemide 80 mg twice daily.   Symptomatic anemia: Transfusion dependent anemia.  She was transfused with several units of PRBCs during the hospital.  Hemoglobin this morning 8.1 she is not having any active bleeding.  GI following and planning for capsule endoscopy.Eliquis D/ced She was seen by GI earlier  and underwent colonoscopy and suspected  to have possible ischemic colitis on the left colon.   EGD showed mild gastritis, H. pylori negative.  Seen by vascular surgery for possible mesenteric ischemia.  No plan for intervention.    Left lower extremity cellulitis/chronic venous insufficiency: Completed the course of  keflex.   Wound care evaluation done and recommended eucerin cream  Left open to air.  Complains of burning pain on the left lower extremity.  Continue gabapentin.  Permanent A. fib: Rate is well controlled.  .  Status post pacemaker/defibrillator.  She was on Eliquis which has been held.  Can restart as an outpatient.  Combined peripheral vascular disease/chronic venous insufficiency: Vascular surgery was consulted for possible mesenteric ischemia.  Also suspected peripheral vascular disease but ABI could not be obtained due to wounds on the left lower extremity.  Vascular surgery recommended follow-up as an outpatient.   There was concern of celiac/SMA stenosis but no postprandial symptoms.  No further treatment plan unless develops clear for symptoms of chronic mesenteric ischemia as per vascular surgery.  Hyperlipidemia: On Crestor  Hypothyroidism: Synthyroid  AKI on CKD stage III: Has a solitary kidney/status post nephrectomy.  Currently kidney function is at baseline.  Hypokalemia: Supplemented         DVT prophylaxis: SCD Code Status: DNR Family Communication: Called  daughter [name]  On phone on 03/14/19 Disposition Plan: Home after full work up   Consultants: Vascular, cardiology  Procedures: EGD/colonoscopy  Antimicrobials:  Anti-infectives (From admission, onward)   Start     Dose/Rate  Route Frequency Ordered Stop   03/04/19 2200  cephALEXin (KEFLEX) capsule 500 mg     500 mg Oral Every 12 hours 03/04/19 1613 03/10/19 1001   03/01/19 1400  ceFAZolin (ANCEF) IVPB 1 g/50 mL premix  Status:  Discontinued     1 g 100 mL/hr over 30 Minutes Intravenous Every 8 hours 03/01/19 1033 03/01/19 1043   03/01/19 1045  ceFAZolin (ANCEF) 0.5 g in dextrose 5 % 100 mL IVPB  Status:  Discontinued     0.5 g 210 mL/hr over 30 Minutes Intravenous Every 12 hours 03/01/19 1043 03/04/19 1613      Subjective:  Patient seen and examined the bedside this morning.  Looks better today.  Hemoglobin is 8.1.  She was anxious about the upcoming procedure but she is agreeable for that.  Had a bowel movement this morning which is black.  No active bleeding  Objective: Vitals:   03/13/19 2039 03/14/19 0300 03/14/19 0437 03/14/19 1024  BP: (!) 104/53  (!) 108/54 138/83  Pulse: 68  62   Resp: 18  18   Temp: 97.6 F (36.4 C)  97.8 F (36.6 C)   TempSrc: Oral  Oral   SpO2: 91%  92%   Weight:  75.8 kg    Height:        Intake/Output Summary (Last 24 hours) at 03/14/2019 1124 Last data filed at 03/14/2019 0900 Gross per 24 hour  Intake 1620.32 ml  Output 1700 ml  Net -79.68 ml   Filed Weights   03/11/19 0405 03/12/19 0027 03/14/19 0300  Weight: 71.9 kg 74.4 kg 75.8 kg    Examination:  General exam: Pleasant elderly female, debilitated, deconditioned HEENT:PERRL,Oral mucosa moist, Ear/Nose normal on gross exam Respiratory system: Bilateral equal air entry, normal vesicular breath sounds, no wheezes or crackles  Cardiovascular system: S1 & S2 heard, Afib. No JVD, murmurs, rubs, gallops or clicks.  1-2+  lower extremity edema.has pacemaker Gastrointestinal system: Abdomen is nondistended, soft and nontender. No organomegaly or masses felt. Normal bowel sounds heard. Central nervous system: Alert and oriented. No focal neurological deficits. Extremities: 1-2+ bilateral  lower extremity edema,  erythematous changes, mild cellulitic changes on the LLE with skin breakdowns,scabs Skin: no icterus ,no pallor   Data Reviewed: I have personally reviewed following labs and imaging studies  CBC: Recent Labs  Lab 03/10/19 0348  03/11/19 0420 03/12/19 0522 03/12/19 1228 03/13/19 0416 03/13/19 1857 03/14/19 0419  WBC 6.2  --  6.2 7.0  --  7.3  --  6.4  HGB 6.8*   < > 7.7* 6.6* 8.0* 6.8* 8.2* 8.1*  HCT 23.4*   < > 26.6* 23.4* 26.6* 22.9* 27.2* 26.8*  MCV 81.5  --  84.2 86.7  --  85.8  --  87.6  PLT 264  --  293 292  --  252  --  258   < > = values in this interval not displayed.   Basic Metabolic Panel: Recent Labs  Lab 03/08/19 1205  03/10/19 0348 03/11/19 0420 03/12/19 0522 03/13/19 0416 03/14/19 0419  NA  --    < > 136 136 134* 131* 129*  K  --    < > 3.5 5.3* 4.6 4.1 3.9  CL  --    < > 96*  95* 94* 92* 86*  CO2  --    < > 28 30 28 26 31   GLUCOSE  --    < > 96 106* 108* 105* 102*  BUN  --    < > 55* 66* 78* 85* 87*  CREATININE  --    < > 1.52* 1.84* 1.88* 1.84* 2.11*  CALCIUM  --    < > 8.8* 9.0 8.5* 8.4* 8.6*  MG 2.1  --  2.1  --   --   --   --    < > = values in this interval not displayed.   GFR: Estimated Creatinine Clearance: 21 mL/min (A) (by C-G formula based on SCr of 2.11 mg/dL (H)). Liver Function Tests: No results for input(s): AST, ALT, ALKPHOS, BILITOT, PROT, ALBUMIN in the last 168 hours. No results for input(s): LIPASE, AMYLASE in the last 168 hours. No results for input(s): AMMONIA in the last 168 hours. Coagulation Profile: No results for input(s): INR, PROTIME in the last 168 hours. Cardiac Enzymes: No results for input(s): CKTOTAL, CKMB, CKMBINDEX, TROPONINI in the last 168 hours. BNP (last 3 results) No results for input(s): PROBNP in the last 8760 hours. HbA1C: No results for input(s): HGBA1C in the last 72 hours. CBG: Recent Labs  Lab 03/07/19 1207  GLUCAP 83   Lipid Profile: No results for input(s): CHOL, HDL, LDLCALC, TRIG,  CHOLHDL, LDLDIRECT in the last 72 hours. Thyroid Function Tests: No results for input(s): TSH, T4TOTAL, FREET4, T3FREE, THYROIDAB in the last 72 hours. Anemia Panel: No results for input(s): VITAMINB12, FOLATE, FERRITIN, TIBC, IRON, RETICCTPCT in the last 72 hours. Sepsis Labs: No results for input(s): PROCALCITON, LATICACIDVEN in the last 168 hours.  No results found for this or any previous visit (from the past 240 hour(s)).       Radiology Studies: No results found.      Scheduled Meds: . sodium chloride   Intravenous Once  . sodium chloride   Intravenous Once  . sodium chloride   Intravenous Once  . docusate sodium  100 mg Oral BID  . ezetimibe  10 mg Oral Daily  . [START ON 03/16/2019] ferrous sulfate  325 mg Oral Q breakfast  . fluticasone  2 spray Each Nare Daily  . gabapentin  300 mg Oral BID  . hydrocerin   Topical BID  . hydrocortisone cream   Topical BID  . influenza vaccine adjuvanted  0.5 mL Intramuscular Tomorrow-1000  . levothyroxine  25 mcg Oral QAC breakfast  . lidocaine  1 patch Transdermal Q24H  . loratadine  10 mg Oral Daily  . magnesium oxide  400 mg Oral Daily  . pantoprazole  40 mg Oral Q0600  . polyethylene glycol  34 g Oral Once  . rosuvastatin  40 mg Oral Daily  . sodium chloride flush  3 mL Intravenous Once  . sodium chloride flush  3 mL Intravenous Q12H  . torsemide  60 mg Oral BID   Continuous Infusions: . sodium chloride 1,000 mL (03/04/19 0957)  . ferumoxytol Stopped (03/13/19 1847)     LOS: 17 days    Time spent: 25 mins.More than 50% of that time was spent in counseling and/or coordination of care.      Shelly Coss, MD [facility] Pager [phone]  If 7PM-7AM, please contact night-coverage [email or web address] Password [facility] 03/14/2019, 11:24 AM

## 2019-03-14 NOTE — Progress Notes (Signed)
Call placed to [facility] to request 3 grape juices for mirilax dosing tonight.

## 2019-03-14 NOTE — Progress Notes (Signed)
          Daily Rounding Note  03/14/2019, 9:45 AM  LOS: 17 days   SUBJECTIVE:   Chief complaint:    Anemia, GI bleed Unable to do capsule endoscopy this Am as she had consumed clear breakfast, so study set for tmrw AM.  Feels better.  Is still weak and having difficulty standing and moving even short distances, feels unsteady.  No chest pain, no shortness of breath.  No nausea or abdominal pain.  Brown stool last night.  OBJECTIVE:         Vital signs in last 24 hours:    Temp:  [97.3 F (36.3 C)-98.2 F (36.8 C)] 97.8 F (36.6 C) (11/08 0437) Pulse Rate:  [60-75] 62 (11/08 0437) Resp:  [16-18] 18 (11/08 0437) BP: (101-109)/(34-77) 108/54 (11/08 0437) SpO2:  [91 %-95 %] 92 % (11/08 0437) Weight:  [75.8 kg] 75.8 kg (11/08 0300) Last BM Date: 03/13/19 Filed Weights   03/11/19 0405 03/12/19 0027 03/14/19 0300  Weight: 71.9 kg 74.4 kg 75.8 kg   General: Looks better but still somewhat chronically ill.  More relaxed.  Comfortable. Heart: RRR Chest: Diminished breath sounds but clear.  No labored breathing, no cough Abdomen: Soft, not tender, not distended. Extremities: Bilateral edema and stasis changes. Neuro/Psych: Calm, pleasant, cooperative.  Not confused.  Intake/Output from previous day: 11/07 0701 - 11/08 0700 In: 1740.3 [P.O.:1310; Blood:315; IV Piggyback:115.3] Out: 1700 [Urine:1700]  Intake/Output this shift: No intake/output data recorded.  Lab Results: Recent Labs    03/12/19 0522  03/13/19 0416 03/13/19 1857 03/14/19 0419  WBC 7.0  --  7.3  --  6.4  HGB 6.6*   < > 6.8* 8.2* 8.1*  HCT 23.4*   < > 22.9* 27.2* 26.8*  PLT 292  --  252  --  258   < > = values in this interval not displayed.   BMET Recent Labs    03/12/19 0522 03/13/19 0416 03/14/19 0419  NA 134* 131* 129*  K 4.6 4.1 3.9  CL 94* 92* 86*  CO2 28 26 31   GLUCOSE 108* 105* 102*  BUN 78* 85* 87*  CREATININE 1.88* 1.84* 2.11*  CALCIUM  8.5* 8.4* 8.6*     ASSESMENT:   *Anemia. Ongoing w need for additional transfusions in recent days. Iron deficient. Day 4 oral iron. 02/28/19 EGD showed mild gastritis. Colonoscopy showed ischemic colitis and diverticulosis. TSH ok as of 2 weeks ago Got her 6th PRBC yesterday,  Hgb 6.8 >> transfused  >> 8.2 >> 8.1.   Feraheme infusion 11/6. Protonix 40 mg po q day in place.    * Nausea.  Improved.PPI was restarted 11/6, had not been in use for bulk of admission.   * Chronic AC for hx afib, non-ischemic CM, hx TIA. Coumadin PTA stopped, reversed and Eliquis restarted/stopped 10/25 - 10/5.     PLAN   *   Capsule endo reset for tmrw AM.  Give cl  *   CBC in AM.    *   HH diet today, clears for PM meal, miralax this PM, NPO after 10 PM tonight.  All orders placed.     *  Hold oral iron, as it will confound read of capsule study resume on Tuesday.      Azucena Freed  03/14/2019, 9:45 AM Phone [phone]

## 2019-03-14 NOTE — Progress Notes (Signed)
Patient resting comfortably during shift report. Denies complaints.  

## 2019-03-14 NOTE — Progress Notes (Signed)
Patient believes that the Gabapentin she has been taking is causing her sudden tremors, weakness and buckling of her knees. Patient wishes to hold future doses of gabapentin to eliminate it as a contributing factor.   Will pass on to night shift.

## 2019-03-14 NOTE — Progress Notes (Signed)
Both [name] and [name] (Patient's daughters) have been updated on delayed procedure.

## 2019-03-14 NOTE — Progress Notes (Signed)
Progress Note  Patient Name: [name] Date of Encounter: 03/14/2019  Primary Cardiologist: Virl Axe, MD  Subjective   Sitting up on bedside this morning, appears comfortable.  She is planned for a capsule endoscopy today for further investigation of anemia.  No chest pain or palpitations.  Inpatient Medications    Scheduled Meds: . sodium chloride   Intravenous Once  . sodium chloride   Intravenous Once  . sodium chloride   Intravenous Once  . docusate sodium  100 mg Oral BID  . ezetimibe  10 mg Oral Daily  . ferrous sulfate  325 mg Oral Q breakfast  . fluticasone  2 spray Each Nare Daily  . gabapentin  300 mg Oral BID  . hydrocerin   Topical BID  . hydrocortisone cream   Topical BID  . influenza vaccine adjuvanted  0.5 mL Intramuscular Tomorrow-1000  . levothyroxine  25 mcg Oral QAC breakfast  . lidocaine  1 patch Transdermal Q24H  . loratadine  10 mg Oral Daily  . magnesium oxide  400 mg Oral Daily  . pantoprazole  40 mg Oral Q0600  . rosuvastatin  40 mg Oral Daily  . sodium chloride flush  3 mL Intravenous Once  . sodium chloride flush  3 mL Intravenous Q12H  . torsemide  80 mg Oral BID   Continuous Infusions: . sodium chloride 1,000 mL (03/04/19 0957)  . ferumoxytol Stopped (03/13/19 1847)   PRN Meds: sodium chloride, acetaminophen, bisacodyl, fentaNYL (SUBLIMAZE) injection, ondansetron **OR** ondansetron (ZOFRAN) IV, oxyCODONE **OR** oxyCODONE   Vital Signs    Vitals:   03/13/19 1722 03/13/19 2039 03/14/19 0300 03/14/19 0437  BP: 106/77 (!) 104/53  (!) 108/54  Pulse: 63 68  62  Resp: 16 18  18   Temp: (!) 97.3 F (36.3 C) 97.6 F (36.4 C)  97.8 F (36.6 C)  TempSrc: Oral Oral  Oral  SpO2: 93% 91%  92%  Weight:   75.8 kg   Height:        Intake/Output Summary (Last 24 hours) at 03/14/2019 0754 Last data filed at 03/14/2019 0740 Gross per 24 hour  Intake 1740.32 ml  Output 1700 ml  Net 40.32 ml   Filed Weights   03/11/19 0405 03/12/19  0027 03/14/19 0300  Weight: 71.9 kg 74.4 kg 75.8 kg    Telemetry    Atrial fibrillation with intermittent ventricular pacing.  Personally reviewed.  Physical Exam   GEN:  Chronically ill-appearing elderly woman, no acute distress.   Neck: No JVD. Cardiac:  Irregularly irregular, 2/6 systolic murmur, no gallop.  Respiratory: Nonlabored. Clear to auscultation bilaterally. GI: Soft, nontender, bowel sounds present. MS:  Bilateral leg edema with chronic ulcerations left lower leg.Marland Kitchen Neuro:  Nonfocal. Psych: Alert and oriented x3.  Normal affect.  Labs    Chemistry Recent Labs  Lab 03/12/19 0522 03/13/19 0416 03/14/19 0419  NA 134* 131* 129*  K 4.6 4.1 3.9  CL 94* 92* 86*  CO2 28 26 31   GLUCOSE 108* 105* 102*  BUN 78* 85* 87*  CREATININE 1.88* 1.84* 2.11*  CALCIUM 8.5* 8.4* 8.6*  GFRNONAA 24* 25* 21*  GFRAA 28* 29* 24*  ANIONGAP 12 13 12      Hematology Recent Labs  Lab 03/12/19 0522  03/13/19 0416 03/13/19 1857 03/14/19 0419  WBC 7.0  --  7.3  --  6.4  RBC 2.70*  --  2.67*  --  3.06*  HGB 6.6*   < > 6.8* 8.2* 8.1*  HCT  23.4*   < > 22.9* 27.2* 26.8*  MCV 86.7  --  85.8  --  87.6  MCH 24.4*  --  25.5*  --  26.5  MCHC 28.2*  --  29.7*  --  30.2  RDW 19.8*  --  21.0*  --  21.2*  PLT 292  --  252  --  258   < > = values in this interval not displayed.    Cardiac Enzymes Recent Labs  Lab 02/24/19 2051 02/25/19 0002  TROPONINIHS 74* 70*    Radiology    No results found.  Cardiac Studies   Echocardiogram 10/06/2018:  1. The left ventricle has normal systolic function with an ejection fraction of 60-65%. The cavity size was normal. Left ventricular diastolic function could not be evaluated due to nondiagnostic images.  2. The right ventricle has normal systolic function. The cavity was mildly enlarged. There is moderately increased right ventricular wall thickness. Right ventricular systolic pressure is severely elevated with an estimated pressure of 93.2  mmHg.  3. Left atrial size was mildly dilated.  4. The mitral valve is degenerative. Mild thickening of the mitral valve leaflet.  5. The tricuspid valve is not well visualized. Tricuspid valve regurgitation is mild-moderate.  6. The aortic valve was not well visualized. Mild thickening of the aortic valve. Aortic valve regurgitation is trivial by color flow Doppler.  7. The inferior vena cava was dilated in size with <50% respiratory variability.  Heart catheterization 03/08/2019: 1. Elevated right and left heart filling pressures.  2. Pulmonary venous hypertension.  3. Prominent V-waves in PCWP tracing.  4. Preserved cardiac output.   RA mean 13 RV 65/134 PA 63/23 mean 40 PCWP mean 25 with v-waves to 47  Oxygen saturations: PA 59% AO 100%  Cardiac Output (Fick) 5.79  Cardiac Index (Fick) 3.18 PVR 2.6 WU  Patient Profile     85 y.o. female with a history of diastolic HF with R>L symptoms, permanent AF (CHADSVaSC = 8), CKD III-IV with solitary kidney admitted with symptomatic anemia, hgb 5.8 on admit requiring transfusion. GI w/u suggestive of possible ischemic bowel. Warfarin stopped and now on apixaban. Major issue now is significant RHF with 2-3+ edema and with weeping lesions. Has previously refused wraps due to LE pain.   Assessment & Plan    1.  Acute on chronic diastolic heart failure, LVEF 60 to 65% (has prior history of nonischemic cardiomyopathy with normalization of LVEF, Med[employer]onic ICD).  She has associated pulmonary venous hypertension with recent right heart catheterization numbers outlined above.  Intake and output incomplete.  She has been on Demadex 80 mg twice daily, creatinine has bumped up to 2.1 from 1.84.  2.  Acute on chronic kidney injury with CKD stage IIIb at baseline.  Follow-up creatinine has increased to 2.1.  3.  Permanent atrial fibrillation.  Eliquis currently held in the setting of GI bleed. Not requiring AV nodal blockers at this time for heart rate  control.  She has a Medtronic ICD in place with intermittent ventricular pacing.  4.  Mixed hyperlipidemia, on Crestor and Zetia.  5.  Both mesenteric vascular disease and PAD, followed by [facility].  6.  Symptomatic anemia with GI bleed and evidence of ischemic colitis.  Hemoglobin 8.1 after PRBCs.  She is for capsule endoscopy today per GI.  Continue to hold anticoagulation.  Will reduce Demadex to 60 mg twice daily, follow-up BMET in a.m.  GI work-up continues.  She is not requiring AV nodal  blockers for heart rate control of atrial fibrillation.  Signed, Rozann Lesches, MD  03/14/2019, 7:54 AM

## 2019-03-15 ENCOUNTER — Encounter (HOSPITAL_COMMUNITY)
Admission: EM | Disposition: A | Discharge status: Home / Self Care | Payer: Self-pay | Source: Home / Self Care | Attending: Internal Medicine

## 2019-03-15 DIAGNOSIS — I87332 Chronic venous hypertension (idiopathic) with ulcer and inflammation of left lower extremity: Secondary | ICD-10-CM

## 2019-03-15 DIAGNOSIS — I87319 Chronic venous hypertension (idiopathic) with ulcer of unspecified lower extremity: Secondary | ICD-10-CM

## 2019-03-15 DIAGNOSIS — I739 Peripheral vascular disease, unspecified: Secondary | ICD-10-CM

## 2019-03-15 DIAGNOSIS — L97929 Non-pressure chronic ulcer of unspecified part of left lower leg with unspecified severity: Secondary | ICD-10-CM

## 2019-03-15 DIAGNOSIS — L97909 Non-pressure chronic ulcer of unspecified part of unspecified lower leg with unspecified severity: Secondary | ICD-10-CM

## 2019-03-15 DIAGNOSIS — D500 Iron deficiency anemia secondary to blood loss (chronic): Secondary | ICD-10-CM

## 2019-03-15 HISTORY — PX: GIVENS CAPSULE STUDY: SHX5432

## 2019-03-15 LAB — CBC
HCT: 28.2 % — ABNORMAL LOW (ref 36.0–46.0)
Hemoglobin: 8.4 g/dL — ABNORMAL LOW (ref 12.0–15.0)
MCH: 26.2 pg (ref 26.0–34.0)
MCHC: 29.8 g/dL — ABNORMAL LOW (ref 30.0–36.0)
MCV: 87.9 fL (ref 80.0–100.0)
Platelets: 282 10*3/uL (ref 150–400)
RBC: 3.21 MIL/uL — ABNORMAL LOW (ref 3.87–5.11)
RDW: 22.4 % — ABNORMAL HIGH (ref 11.5–15.5)
WBC: 5.9 10*3/uL (ref 4.0–10.5)
nRBC: 0.7 % — ABNORMAL HIGH (ref 0.0–0.2)

## 2019-03-15 LAB — TYPE AND SCREEN
ABO/RH(D): A NEG
Antibody Screen: POSITIVE
Donor AG Type: NEGATIVE
Donor AG Type: NEGATIVE
Donor AG Type: NEGATIVE
Donor AG Type: NEGATIVE
Unit division: 0
Unit division: 0
Unit division: 0
Unit division: 0

## 2019-03-15 LAB — BASIC METABOLIC PANEL
Anion gap: 14 (ref 5–15)
BUN: 84 mg/dL — ABNORMAL HIGH (ref 8–23)
CO2: 31 mmol/L (ref 22–32)
Calcium: 8.8 mg/dL — ABNORMAL LOW (ref 8.9–10.3)
Chloride: 88 mmol/L — ABNORMAL LOW (ref 98–111)
Creatinine, Ser: 1.9 mg/dL — ABNORMAL HIGH (ref 0.44–1.00)
GFR calc Af Amer: 28 mL/min — ABNORMAL LOW (ref >60)
GFR calc non Af Amer: 24 mL/min — ABNORMAL LOW (ref >60)
Glucose, Bld: 94 mg/dL (ref 70–99)
Potassium: 3.1 mmol/L — ABNORMAL LOW (ref 3.5–5.1)
Sodium: 133 mmol/L — ABNORMAL LOW (ref 135–145)

## 2019-03-15 LAB — BPAM RBC
Blood Product Expiration Date: 202011212359
Blood Product Expiration Date: 202011282359
Blood Product Expiration Date: 202012082359
Blood Product Expiration Date: 202012092359
ISSUE DATE / TIME: 202011041016
ISSUE DATE / TIME: 202011060651
ISSUE DATE / TIME: 202011071445
Unit Type and Rh: 600
Unit Type and Rh: 600
Unit Type and Rh: 600
Unit Type and Rh: 600

## 2019-03-15 SURGERY — IMAGING PROCEDURE, GI TRACT, INTRALUMINAL, VIA CAPSULE

## 2019-03-15 MED ORDER — POTASSIUM CHLORIDE CRYS ER 20 MEQ PO TBCR
40.0000 meq | EXTENDED_RELEASE_TABLET | Freq: Two times a day (BID) | ORAL | Status: DC
  Administered 2019-03-15 – 2019-03-17 (×5): 40 meq via ORAL
  Filled 2019-03-15 (×5): qty 2

## 2019-03-15 NOTE — Progress Notes (Signed)
PROGRESS NOTE    [name]  GYJ:[id number] DOB: 07-30-1933 DOA: 02/24/2019 PCP: Elby Beck, FNP   Brief Narrative:  Patient is 65 old female with history of atrial fibrillation, status post pacemaker/defibrillator, chronic diastolic CHF, chronic anemia, hypothyroidism, CKD stage III with single functioning kidney who presents to the emergency department with increasing weakness, fall at home, leg swelling and shortness of breath.  Chest x-ray on presentation showed bilateral pleural effusion.  She was found to have bilateral significant lower extremity edema, hemoglobin of 6.2.  She was admitted for the management of acute on chronic CHF exacerbation and started on Lasix drip.  Cardiology following.Underwent right heart catheterization on 03/08/19 which showed volume overload.  She was started on Lasix drip.  Lasix drip has been stopped and she has been started on torsemide. Discharge planning canceled due to persistent drop in the hemoglobin.    GI following and planning for capsule endoscopy .   Assessment & Plan:   Principal Problem:   Acute on chronic diastolic CHF (congestive heart failure), NYHA class 4 (HCC) Active Problems:   Permanent atrial fibrillation   COPD (chronic obstructive pulmonary disease) (HCC)   Hypertension   Chronic kidney disease (CKD), stage IV (severe) (HCC)   PAD (peripheral artery disease) (HCC)   S/p nephrectomy   Pulmonary hypertension (HCC)   Symptomatic anemia   AKI (acute kidney injury) ([place])   Melena   Acute on chronic diastolic CHF exacerbation: Presented with volume overload.  Ejection fraction 66-65% as per last echo.  On torsemide 100 mg twice a day at home.  She was  on Lasix drip at 15mg /hr. She was also given few days metalazone.  Bilateral lower extremity edema has improved.  Continue compression wrap ,unna boot on the right.  Cardiology was following.  Lasix drip stopped and she has been started on torsemide 60 mg twice daily.   Symptomatic anemia: Transfusion dependent anemia.  She was transfused with several units of PRBCs during the hospital.  Hemoglobin this morning 8.4 she is not having any active bleeding.  GI following and planning for capsule endoscopy.Eliquis D/ced She was seen by GI earlier  and underwent colonoscopy and suspected  to have possible ischemic colitis on the left colon.   EGD showed mild gastritis, H. pylori negative.  Seen by vascular surgery for possible mesenteric ischemia.  No plan for intervention.    Left lower extremity cellulitis/chronic venous insufficiency: Completed the course of  keflex.   Wound care evaluation done and recommended eucerin cream  Left open to air.  Complains of burning pain on the left lower extremity.  Continue gabapentin. I have requested Dr. Sharol Given to look into her legs today.  Permanent A. fib: Rate is well controlled.  .  Status post pacemaker/defibrillator.  She was on Eliquis which has been held.  Can restart as an outpatient.  Combined peripheral vascular disease/chronic venous insufficiency: Vascular surgery was consulted for possible mesenteric ischemia.  Also suspected peripheral vascular disease but ABI could not be obtained due to wounds on the left lower extremity.  Vascular surgery recommended follow-up as an outpatient.   There was concern of celiac/SMA stenosis but no postprandial symptoms.  No further treatment plan unless develops clear for symptoms of chronic mesenteric ischemia as per vascular surgery.  Hyperlipidemia: On Crestor  Hypothyroidism: Synthyroid  AKI on CKD stage III: Has a solitary kidney/status post nephrectomy.  Currently kidney function is at baseline.  Hypokalemia: being upplemented  DVT prophylaxis: SCD Code Status: DNR Family Communication: Called daughter [name]  On phone on 03/14/19 Disposition Plan: Home after full work up   Consultants: Vascular, cardiology  Procedures: EGD/colonoscopy  Antimicrobials:   Anti-infectives (From admission, onward)   Start     Dose/Rate Route Frequency Ordered Stop   03/04/19 2200  cephALEXin (KEFLEX) capsule 500 mg     500 mg Oral Every 12 hours 03/04/19 1613 03/10/19 1001   03/01/19 1400  ceFAZolin (ANCEF) IVPB 1 g/50 mL premix  Status:  Discontinued     1 g 100 mL/hr over 30 Minutes Intravenous Every 8 hours 03/01/19 1033 03/01/19 1043   03/01/19 1045  ceFAZolin (ANCEF) 0.5 g in dextrose 5 % 100 mL IVPB  Status:  Discontinued     0.5 g 210 mL/hr over 30 Minutes Intravenous Every 12 hours 03/01/19 1043 03/04/19 1613      Subjective:  Patient seen and examined the bedside this morning.  Hemodynamically stable.  Complains of pain on the left leg.  I took the pictures of her leg and send them to Dr. Sharol Given who will  see her later today.  She is undergoing capsule endoscopy  Objective: Vitals:   03/15/19 0444 03/15/19 0624 03/15/19 1128 03/15/19 1130  BP: (!) 108/38  (!) 130/58   Pulse: 63  (!) 59 68  Resp: 18  18   Temp: (!) 97.5 F (36.4 C)  (!) 97.5 F (36.4 C)   TempSrc: Oral  Oral   SpO2: 97%  98%   Weight:  75.6 kg    Height:        Intake/Output Summary (Last 24 hours) at 03/15/2019 1158 Last data filed at 03/15/2019 0946 Gross per 24 hour  Intake 443.33 ml  Output 2400 ml  Net -1956.67 ml   Filed Weights   03/12/19 0027 03/14/19 0300 03/15/19 0624  Weight: 74.4 kg 75.8 kg 75.6 kg    Examination:  General exam: Pleasant elderly female, debilitated, deconditioned HEENT:PERRL,Oral mucosa moist, Ear/Nose normal on gross exam Respiratory system: Bilateral equal air entry, normal vesicular breath sounds, no wheezes or crackles  Cardiovascular system: S1 & S2 heard, Afib. No JVD, murmurs, rubs, gallops or clicks.  1-2+  lower extremity edema.has pacemaker Gastrointestinal system: Abdomen is nondistended, soft and nontender. No organomegaly or masses felt. Normal bowel sounds heard. Central nervous system: Alert and oriented. No focal  neurological deficits. Extremities: 1-2+ bilateral  lower extremity edema, erythematous changes, mild cellulitic changes on the LLE with skin breakdowns,scabs Skin: no icterus ,no pallor   Data Reviewed: I have personally reviewed following labs and imaging studies  CBC: Recent Labs  Lab 03/11/19 0420 03/12/19 0522 03/12/19 1228 03/13/19 0416 03/13/19 1857 03/14/19 0419 03/15/19 0502  WBC 6.2 7.0  --  7.3  --  6.4 5.9  HGB 7.7* 6.6* 8.0* 6.8* 8.2* 8.1* 8.4*  HCT 26.6* 23.4* 26.6* 22.9* 27.2* 26.8* 28.2*  MCV 84.2 86.7  --  85.8  --  87.6 87.9  PLT 293 292  --  252  --  258 128   Basic Metabolic Panel: Recent Labs  Lab 03/08/19 1205  03/10/19 0348 03/11/19 0420 03/12/19 0522 03/13/19 0416 03/14/19 0419 03/15/19 0502  NA  --    < > 136 136 134* 131* 129* 133*  K  --    < > 3.5 5.3* 4.6 4.1 3.9 3.1*  CL  --    < > 96* 95* 94* 92* 86* 88*  CO2  --    < >  28 30 28 26 31 31   GLUCOSE  --    < > 96 106* 108* 105* 102* 94  BUN  --    < > 55* 66* 78* 85* 87* 84*  CREATININE  --    < > 1.52* 1.84* 1.88* 1.84* 2.11* 1.90*  CALCIUM  --    < > 8.8* 9.0 8.5* 8.4* 8.6* 8.8*  MG 2.1  --  2.1  --   --   --   --   --    < > = values in this interval not displayed.   GFR: Estimated Creatinine Clearance: 23.3 mL/min (A) (by C-G formula based on SCr of 1.9 mg/dL (H)). Liver Function Tests: No results for input(s): AST, ALT, ALKPHOS, BILITOT, PROT, ALBUMIN in the last 168 hours. No results for input(s): LIPASE, AMYLASE in the last 168 hours. No results for input(s): AMMONIA in the last 168 hours. Coagulation Profile: No results for input(s): INR, PROTIME in the last 168 hours. Cardiac Enzymes: No results for input(s): CKTOTAL, CKMB, CKMBINDEX, TROPONINI in the last 168 hours. BNP (last 3 results) No results for input(s): PROBNP in the last 8760 hours. HbA1C: No results for input(s): HGBA1C in the last 72 hours. CBG: No results for input(s): GLUCAP in the last 168 hours. Lipid  Profile: No results for input(s): CHOL, HDL, LDLCALC, TRIG, CHOLHDL, LDLDIRECT in the last 72 hours. Thyroid Function Tests: No results for input(s): TSH, T4TOTAL, FREET4, T3FREE, THYROIDAB in the last 72 hours. Anemia Panel: No results for input(s): VITAMINB12, FOLATE, FERRITIN, TIBC, IRON, RETICCTPCT in the last 72 hours. Sepsis Labs: No results for input(s): PROCALCITON, LATICACIDVEN in the last 168 hours.  No results found for this or any previous visit (from the past 240 hour(s)).       Radiology Studies: No results found.      Scheduled Meds: . sodium chloride   Intravenous Once  . sodium chloride   Intravenous Once  . sodium chloride   Intravenous Once  . docusate sodium  100 mg Oral BID  . ezetimibe  10 mg Oral Daily  . [START ON 03/16/2019] ferrous sulfate  325 mg Oral Q breakfast  . fluticasone  2 spray Each Nare Daily  . gabapentin  300 mg Oral BID  . hydrocerin   Topical BID  . hydrocortisone cream   Topical BID  . influenza vaccine adjuvanted  0.5 mL Intramuscular Tomorrow-1000  . levothyroxine  25 mcg Oral QAC breakfast  . lidocaine  1 patch Transdermal Q24H  . loratadine  10 mg Oral Daily  . magnesium oxide  400 mg Oral Daily  . pantoprazole  40 mg Oral Q0600  . potassium chloride  40 mEq Oral BID  . rosuvastatin  40 mg Oral Daily  . sodium chloride flush  3 mL Intravenous Once  . sodium chloride flush  3 mL Intravenous Q12H  . torsemide  60 mg Oral BID   Continuous Infusions: . sodium chloride 1,000 mL (03/04/19 0957)  . ferumoxytol Stopped (03/13/19 1847)     LOS: 18 days    Time spent: 25 mins.More than 50% of that time was spent in counseling and/or coordination of care.      Shelly Coss, MD [facility] Pager [phone]  If 7PM-7AM, please contact night-coverage [email or web address] Password [email or web address] 03/15/2019, 11:58 AM

## 2019-03-15 NOTE — Progress Notes (Addendum)
    Progress Note   Subjective  Chief Complaint: Anemia, GI bleed  Capsule was dropped this morning, patient is doing well so far.  No complaints or concerns.    Objective   Vital signs in last 24 hours: Temp:  [97.5 F (36.4 C)-98.3 F (36.8 C)] 97.5 F (36.4 C) (11/09 0444) Pulse Rate:  [63-74] 63 (11/09 0444) Resp:  [16-20] 18 (11/09 0444) BP: (107-128)/(38-54) 108/38 (11/09 0444) SpO2:  [90 %-97 %] 97 % (11/09 0444) Weight:  [75.6 kg] 75.6 kg (11/09 0624) Last BM Date: 03/13/19 General:   Elderly, chronically ill-appearing white female in NAD Heart:  Regular rate and rhythm; no murmurs Lungs: Respirations even and unlabored, lungs CTA bilaterally Abdomen:  Soft, nontender and nondistended. Normal bowel sounds. Extremities:  Without edema. Neurologic:  Alert and oriented,  grossly normal neurologically. Psych:  Cooperative. Normal mood and affect.  Intake/Output from previous day: 11/08 0701 - 11/09 0700 In: 563.3 [P.O.:480; I.V.:83.3] Out: 2100 [Urine:2100] Intake/Output this shift: Total I/O In: -  Out: 300 [Urine:300]  Lab Results: Recent Labs    03/13/19 0416 03/13/19 1857 03/14/19 0419 03/15/19 0502  WBC 7.3  --  6.4 5.9  HGB 6.8* 8.2* 8.1* 8.4*  HCT 22.9* 27.2* 26.8* 28.2*  PLT 252  --  258 282   BMET Recent Labs    03/13/19 0416 03/14/19 0419 03/15/19 0502  NA 131* 129* 133*  K 4.1 3.9 3.1*  CL 92* 86* 88*  CO2 26 31 31   GLUCOSE 105* 102* 94  BUN 85* 87* 84*  CREATININE 1.84* 2.11* 1.90*  CALCIUM 8.4* 8.6* 8.8*    Assessment / Plan:   Assessment: 1.  Anemia: Ongoing with need for additional transfusions in recent days, iron deficient, day 5 of oral iron, 02/28/2019 EGD showed mild gastritis, colonoscopy with ischemic lettuce and diverticulosis, TSH okay as of 2 weeks ago, get her 6 PRBC 03/13/2019, hemoglobin currently stable at 8.4, Protonix 40 mg p.o. daily, Feraheme infusion 11/6 2.  Nausea: Improved, PPI restarted 11/6 3.  Chronic AC  for history of A. fib, nonischemic CM, history of TIA: Coumadin PTA stopped, reversed and Eliquis restarted/stop 10/25-11/5  Plan: 1.  Will await completion of capsule endoscopy and results. 2.  Please await further recommendations per Dr. Henrene Pastor later today  Thank you for kind consultation, we will continue to follow.    LOS: 18 days   Levin Erp  03/15/2019, 11:22 AM  GI ATTENDING  Case discussed with Dr. Tarri Glenn.  Interval history and data reviewed.  Agree with interval progress note.  Capsule endoscopy underway for recurrent GI bleeding with iron deficiency anemia.  No bleeding in recent days.  Docia Chuck. Geri [place]in[place]., M.D. [facility] Division of Gastroenterology

## 2019-03-15 NOTE — Progress Notes (Signed)
Pt on npo start from now, discussed with pt verbalized understanding

## 2019-03-15 NOTE — Care Management Important Message (Signed)
Important Message  Patient Details  Name: MELIDA NORTHINGTON MRN: [id number] Date of Birth: 1934-02-19   Medicare Important Message Given:  Yes     Shelda Altes 03/15/2019, 12:22 PM

## 2019-03-15 NOTE — Consult Note (Signed)
ORTHOPAEDIC CONSULTATION  REQUESTING PHYSICIAN: Shelly Coss, MD  Chief Complaint: Chronic venous insufficiency with acute cellulitis and swelling left leg  HPI: [name] is a 85 y.o. female who presents with venous insufficiency both legs with a Unna boot on the right leg.  Patient has cellulitis dermatitis and ulceration to the left lower extremity with venous and lymphatic insufficiency.  Past Medical History:  Diagnosis Date  . Adjustment disorder with anxiety   . Anginal pain ([place])   . Arthritis    "some in my hands" (11/11/2012)  . Atrial fibrillation ([place])   . Automatic implantable cardioverter-defibrillator in situ   . Blood in stool   . Carotid artery stenosis 09/2007   a. 09/2007: 60-79% bilateral (stable); b. 10/2008: 40-59% R 60-79%   . Chest pain, unspecified   . Chronic airway obstruction, not elsewhere classified   . Chronic bronchitis ([place])    "get it some; not q year" (11/11/2012)  . Chronic diastolic CHF (congestive heart failure) ([place]) 06/04/2013  . Chronic kidney disease, unspecified   . Coronary artery disease    non-obstructive by 2006 cath  . Frequent UTI    "get them a couple times/yr" (11/11/2012)  . Hemorrhage of rectum and anus   . High cholesterol   . History of blood transfusion 04/2011   "after hip OR" (11/11/2012)  . Hypertension 05/20/2011  . Hypertr obst cardiomyop   . Hypotension, unspecified    cardiac cath 2006..nonobstructive CAD 30-40s lesions.Marland KitchenETT 1/09 nondiagnostic due to poor HR response..Right Renal Cancer 2003  . Long term (current) use of anticoagulants   . Malignant neoplasm of kidney, except pelvis   . Osteoarthritis of right hip   . Other and unspecified coagulation defects   . PONV (postoperative nausea and vomiting)   . Renal cancer ([place]) 06/2001   Right  . Secondary cardiomyopathy, unspecified   . Sinus bradycardia   . Urge incontinence    Past Surgical History:  Procedure Laterality Date  . ABDOMINAL HYSTERECTOMY   1975   for benign causes  . APPENDECTOMY    . BI-VENTRICULAR PACEMAKER UPGRADE  05/04/2010  . BIOPSY  02/28/2019   Procedure: BIOPSY;  Surgeon: Milus Banister, MD;  Location[facility] ENDOSCOPY;  Service: Endoscopy;;  . CARDIAC CATHETERIZATION  2006  . CARDIOVERSION N/A 02/20/2018   Procedure: CARDIOVERSION;  Surgeon: Minna Merritts, MD;  Location: [place] ORS;  Service: Cardiovascular;  Laterality: N/A;  . CARDIOVERSION N/A 03/27/2018   Procedure: CARDIOVERSION;  Surgeon: Minna Merritts, MD;  Location: [facility] ORS;  Service: Cardiovascular;  Laterality: N/A;  . CATARACT EXTRACTION W/ INTRAOCULAR LENS  IMPLANT, BILATERAL  01/2006-02-2006  . CHOLECYSTECTOMY N/A 11/11/2012   Procedure: LAPAROSCOPIC CHOLECYSTECTOMY WITH INTRAOPERATIVE CHOLANGIOGRAM;  Surgeon: Imogene Burn. Georgette Dover, MD;  Location: [place];  Service: General;  Laterality: N/A;  . COLONOSCOPY WITH PROPOFOL N/A 02/28/2019   Procedure: COLONOSCOPY WITH PROPOFOL;  Surgeon: Milus Banister, MD;  Location: [facility] ENDOSCOPY;  Service: Endoscopy;  Laterality: N/A;  . EP IMPLANTABLE DEVICE N/A 02/21/2016   Procedure: ICD Generator Changeout;  Surgeon: Deboraha Sprang, MD;  Location: [place] CV LAB;  Service: Cardiovascular;  Laterality: N/A;  . ESOPHAGOGASTRODUODENOSCOPY (EGD) WITH PROPOFOL N/A 02/28/2019   Procedure: ESOPHAGOGASTRODUODENOSCOPY (EGD) WITH PROPOFOL;  Surgeon: Milus Banister, MD;  Location: [facility] ENDOSCOPY;  Service: Endoscopy;  Laterality: N/A;  . INSERT / REPLACE / REMOVE PACEMAKER  05-01-11   02-28-05-/05-04-10-ICD-MEDTRONIC MAXIMAL DR  . JOINT REPLACEMENT    . LAPAROSCOPIC CHOLECYSTECTOMY  11/11/2012  . LAPAROSCOPIC LYSIS OF ADHESIONS N/A 11/11/2012   Procedure: LAPAROSCOPIC LYSIS OF ADHESIONS;  Surgeon: Imogene Burn. Georgette Dover, MD;  Location: [place];  Service: General;  Laterality: N/A;  . NEPHRECTOMY Right 06/2001    S/P RENAL CELL CANCER  . PRESSURE SENSOR/CARDIOMEMS N/A 02/03/2019   Procedure: PRESSURE SENSOR/CARDIOMEMS;  Surgeon: Larey Dresser, MD;  Location: [place] CV LAB;  Service: Cardiovascular;  Laterality: N/A;  . RIGHT HEART CATH N/A 11/09/2018   Procedure: RIGHT HEART CATH;  Surgeon: Larey Dresser, MD;  Location: [place] CV LAB;  Service: Cardiovascular;  Laterality: N/A;  . RIGHT HEART CATH N/A 03/08/2019   Procedure: RIGHT HEART CATH;  Surgeon: Larey Dresser, MD;  Location: [place] CV LAB;  Service: Cardiovascular;  Laterality: N/A;  . SUBMUCOSAL TATTOO INJECTION  02/28/2019   Procedure: SUBMUCOSAL TATTOO INJECTION;  Surgeon: Milus Banister, MD;  Location[facility];  Service: Endoscopy;;  . TOTAL HIP ARTHROPLASTY Right 05/03/2011   Procedure: TOTAL HIP ARTHROPLASTY ANTERIOR APPROACH;  Surgeon: Mcarthur Rossetti;  Location: [facility] ORS;  Service: Orthopedics;  Laterality: Right;  Removal of Cannulated Screws Right Hip, Right Direct Anterior Hip Replacement  . TOTAL HIP ARTHROPLASTY Left 10/09/2018   Procedure: TOTAL HIP ARTHROPLASTY ANTERIOR APPROACH;  Surgeon: Rod Can, MD;  Location: [place];  Service: Orthopedics;  Laterality: Left;   Social History   Socioeconomic History  . Marital status: Widowed    Spouse name: Not on file  . Number of children: Not on file  . Years of education: Not on file  . Highest education level: Not on file  Occupational History  . Occupation: Retired    Fish farm manager: RETIRED  Social Needs  . Financial resource strain: Not on file  . Food insecurity    Worry: Not on file    Inability: Not on file  . Transportation needs    Medical: Not on file    Non-medical: Not on file  Tobacco Use  . Smoking status: Former Smoker    Packs/day: 0.50    Years: 40.00    Pack years: 20.00    Types: Cigarettes    Quit date: 05/06/2001    Years since quitting: 17.8  . Smokeless tobacco: Never Used  Substance and Sexual Activity  . Alcohol use: No    Alcohol/week: 0.0 standard drinks  . Drug use: No  . Sexual activity: Never  Lifestyle  . Physical activity     Days per week: Not on file    Minutes per session: Not on file  . Stress: Not on file  Relationships  . Social Herbalist on phone: Not on file    Gets together: Not on file    Attends religious service: Not on file    Active member of club or organization: Not on file    Attends meetings of clubs or organizations: Not on file    Relationship status: Not on file  Other Topics Concern  . 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   ALCOHOL USE-NO   RETIRED   FORMER TOBACCO USE.....1/2 PACK X 40 YRS UNTIL 2003   Would desire CPR   Family History  Problem Relation Age of Onset  . Heart failure Mother   . Breast cancer Maternal  Aunt 82  . Breast cancer Cousin   . Breast cancer Other   . Colon cancer Neg Hx    - negative except otherwise stated in the family history section Allergies  Allergen Reactions  . Dilaudid [Hydromorphone] Other (See Comments)    Excessive Somnolence- Required Narcan  . Fentanyl Other (See Comments)    Required Narcan when given with Dilaudid. Tolerated single dose when given during procedure 02/03/2019.   Marland Kitchen Codeine Nausea And Vomiting    Hallucinations   . Morphine And Related Nausea And Vomiting    Hallucinations   . Sulfonamide Derivatives Other (See Comments)    Dry mouth   Prior to Admission medications   Medication Sig Start Date End Date Taking? Authorizing Provider  acetaminophen (TYLENOL) 500 MG tablet Take 500-1,000 mg by mouth every 8 (eight) hours as needed for mild pain or headache.    Yes [provider]  aspirin (ASPIRIN CHILDRENS) 81 MG chewable tablet Chew 1 tablet (81 mg total) by mouth daily. 02/03/19 02/03/20 Yes Larey Dresser, MD  cetirizine (ZYRTEC) 10 MG tablet Take 10 mg by mouth daily.    Yes [provider]  conjugated estrogens  (PREMARIN) vaginal cream Place 1 Applicatorful vaginally daily. Patient taking differently: Place 1 Applicatorful vaginally daily as needed (vaginal dryness).  07/31/18  Yes Elby Beck, FNP  Cranberry (AZO CRANBERRY GUMMIES) 500 MG CHEW Chew 2,000 mg by mouth daily.   Yes [provider]  ezetimibe (ZETIA) 10 MG tablet Take 1 tablet (10 mg total) by mouth daily. 08/18/17  Yes Gollan, Kathlene November, MD  Ferrous Gluconate-C-Folic Acid (IRON-C PO) Take 1 tablet by mouth daily.   Yes [provider]  fluticasone (FLONASE) 50 MCG/ACT nasal spray Place 2 sprays into both nostrils daily. Patient taking differently: Place 2 sprays into both nostrils daily as needed for allergies or rhinitis.  04/22/18  Yes Elby Beck, FNP  hydrocortisone 2.5 % cream Apply 1 application topically daily as needed (to itchy areas).  02/04/18  Yes [provider]  levothyroxine (SYNTHROID) 25 MCG tablet Take 1 tablet (25 mcg total) by mouth daily before breakfast. 09/22/18  Yes Deboraha Sprang, MD  magnesium oxide (MAG-OX) 400 MG tablet Take 1 tablet (400 mg total) by mouth daily. 06/05/18  Yes Deboraha Sprang, MD  metolazone (ZAROXOLYN) 5 MG tablet Take 1 tablet (5 mg total) by mouth once a week. Every Thursday Patient taking differently: Take 5 mg by mouth See admin instructions. Take 5 mg by mouth once a day on Tues and Thursday, the week of 02/21/2019- then, as directed 02/23/19  Yes Clegg, Amy D, NP  potassium chloride SA (K-DUR) 20 MEQ tablet Take 2 tablets (40 mEq total) by mouth every morning AND 1 tablet (20 mEq total) at bedtime. Take 1 tablet (20 meq) by mouth twice daily, except on metolazone days take 2 tablets (40 meq) twice daily. Patient taking differently: Take 20 mEq by mouth two times a day and increase to 80 mEq two times daily on the days Zaroxolyn is taken 11/18/18  Yes Larey Dresser, MD  rosuvastatin (CRESTOR) 40 MG tablet Take 1 tablet (40 mg total) by mouth daily.  Patient taking differently: Take 40 mg by mouth at bedtime.  11/11/18  Yes Larey Dresser, MD  spironolactone (ALDACTONE) 25 MG tablet Take 1 tablet (25 mg total) by mouth daily. 11/18/18  Yes Larey Dresser, MD  torsemide (DEMADEX) 20 MG tablet Take 5 tablets (100  mg total) by mouth 2 (two) times daily. 02/03/19  Yes Larey Dresser, MD  vitamin C (ASCORBIC ACID) 250 MG tablet Take 500 mg by mouth daily.   Yes [provider]  warfarin (COUMADIN) 2 MG tablet Take 1 tablet (2 mg total) by mouth as directed. Take 2 mg on Monday- Saturday and 1 Mg on Sunday Patient taking differently: Take 2-3 mg by mouth See admin instructions. Take 2 mg by mouth at bedtime on Sun/Mon/Tues/Thurs/Fri/Sat and 3 mg on Wed 10/12/18  Yes Aline August, MD  bisacodyl (DULCOLAX) 5 MG EC tablet Take 1 tablet (5 mg total) by mouth daily as needed for moderate constipation. Patient not taking: Reported on 02/25/2019 10/12/18   Aline August, MD  docusate sodium (COLACE) 100 MG capsule Take 1 capsule (100 mg total) by mouth 2 (two) times daily. Patient not taking: Reported on 02/25/2019 10/12/18   Aline August, MD   No results found. - pertinent xrays, CT, MRI studies were reviewed and independently interpreted  Positive ROS: All other systems have been reviewed and were otherwise negative with the exception of those mentioned in the HPI and as above.  Physical Exam: General: Alert, no acute distress Psychiatric: Patient is competent for consent with normal mood and affect Lymphatic: No axillary or cervical lymphadenopathy Cardiovascular: No pedal edema Respiratory: No cyanosis, no use of accessory musculature GI: No organomegaly, abdomen is soft and non-tender    Images:  @ENCIMAGES @  Labs:  Lab Results  Component Value Date   HGBA1C 6.1 (H) 08/17/2014   HGBA1C 5.6 01/14/2014   ESRSEDRATE 2 02/10/2012   REPTSTATUS 11/06/2018 FINAL 11/04/2018   GRAMSTAIN NO WBC SEEN NO ORGANISMS SEEN  02/21/2016    CULT (A) 11/04/2018    <10,000 COLONIES/mL INSIGNIFICANT GROWTH Performed at [facility], [place] [address], [place], [place] [address]    LABORGA ESCHERICHIA COLI 12/24/2016    Lab Results  Component Value Date   ALBUMIN 3.0 (L) 03/03/2019   ALBUMIN 3.0 (L) 03/02/2019   ALBUMIN 3.0 (L) 03/01/2019    Neurologic: Patient does not have protective sensation bilateral lower extremities.   MUSCULOSKELETAL:   Skin: Examination patient's right leg has minimal swelling it is in a Unna compression wrap.  The left lower extremity has massive swelling brawny dermatitis with venous and lymphatic insufficiency she has cellulitis which is tender to palpation ulceration and draining from the left leg.  Patient has an albumin of 3 with mild protein caloric malnutrition as well as a hemoglobin A1c of 6.1.  Assessment: Assessment: Venous and lymphatic insufficiency with ulceration cellulitis and swelling of the left lower extremity.  Plan: Plan: Recommend a 4-layer compression wrap for the left lower extremity as well as antibiotics for several weeks.  I will follow-up in the office 1 week after discharge.  Orders are written for a Profore wrap from wound continence ostomy nursing.  Thank you for the consult and the opportunity to see Ms. Gwinda Passe, [place] [phone] 5:44 PM

## 2019-03-15 NOTE — Plan of Care (Signed)
  Problem: Education: Goal: Knowledge of General Education information will improve Description: Including pain rating scale, medication(s)/side effects and non-pharmacologic comfort measures Outcome: Progressing   Problem: Health Behavior/Discharge Planning: Goal: Ability to manage health-related needs will improve Outcome: Progressing   Problem: Clinical Measurements: Goal: Ability to maintain clinical measurements within normal limits will improve Outcome: Progressing Goal: Will remain free from infection Outcome: Progressing Goal: Diagnostic test results will improve Outcome: Progressing Goal: Respiratory complications will improve Outcome: Progressing Goal: Cardiovascular complication will be avoided Outcome: Progressing   Problem: Activity: Goal: Risk for activity intolerance will decrease Outcome: Progressing   Problem: Elimination: Goal: Will not experience complications related to urinary retention Outcome: Progressing   Problem: Pain Managment: Goal: General experience of comfort will improve Outcome: Progressing   Problem: Safety: Goal: Ability to remain free from injury will improve Outcome: Progressing   Problem: Skin Integrity: Goal: Risk for impaired skin integrity will decrease Outcome: Progressing   Problem: Education: Goal: Ability to demonstrate management of disease process will improve Outcome: Progressing Goal: Ability to verbalize understanding of medication therapies will improve Outcome: Progressing Goal: Individualized Educational Video(s) Outcome: Progressing   Problem: Activity: Goal: Capacity to carry out activities will improve Outcome: Progressing   Problem: Cardiac: Goal: Ability to achieve and maintain adequate cardiopulmonary perfusion will improve Outcome: Progressing   Problem: Education: Goal: Understanding of CV disease, CV risk reduction, and recovery process will improve Outcome: Progressing Goal: Individualized  Educational Video(s) Outcome: Progressing   Problem: Activity: Goal: Ability to return to baseline activity level will improve Outcome: Progressing   Problem: Cardiovascular: Goal: Ability to achieve and maintain adequate cardiovascular perfusion will improve Outcome: Progressing   Problem: Health Behavior/Discharge Planning: Goal: Ability to safely manage health-related needs after discharge will improve Outcome: Progressing

## 2019-03-15 NOTE — Progress Notes (Signed)
Patient refuses bed alarm. 

## 2019-03-15 NOTE — Progress Notes (Addendum)
Patient ID: [name], female   DOB: 05/12/1933, 85 y.o.   MRN: [id number]     Advanced Heart Failure Rounding Note  PCP-Cardiologist: Virl Axe, MD   Subjective:    Still with left leg pain L>R.  Feels tired and weak.   Off of IV diuretics. Now on PO, torsemide 60 bid. SCr improved past 24 hrs, down from 2.11>>190. K 3.1  -2L out yesterday. Net I/Os -12 L total. Wt 167>>166 lb. Still w/ 2+ LEE.  PTA was on higher dose diuretics torsemide 100 bid + once weekly metolazone (5 mg q Thurs).   RHC Procedural Findings (11/2): Hemodynamics (mmHg) RA mean 13 RV 65/134 PA 63/23 mean 40 PCWP mean 25 with v-waves to 47 Oxygen saturations: PA 59% AO 100% Cardiac Output (Fick) 5.79  Cardiac Index (Fick) 3.18 PVR 2.6 WU  Hospital course also notable for anemia requiring several blood transfusions. Last transfusion 11/5. hgb stable over the weekend ~8 range. Scheduled for capsule endoscopy today.   Objective:   Weight Range: 75.6 kg Body mass index is 26.91 kg/m.   Vital Signs:   Temp:  [97.5 F (36.4 C)-98.3 F (36.8 C)] 97.5 F (36.4 C) (11/09 0444) Pulse Rate:  [63-74] 63 (11/09 0444) Resp:  [16-20] 18 (11/09 0444) BP: (107-138)/(38-83) 108/38 (11/09 0444) SpO2:  [90 %-97 %] 97 % (11/09 0444) Weight:  [75.6 kg] 75.6 kg (11/09 0624) Last BM Date: 03/13/19  Weight change: Filed Weights   03/12/19 0027 03/14/19 0300 03/15/19 0624  Weight: 74.4 kg 75.8 kg 75.6 kg    Intake/Output:   Intake/Output Summary (Last 24 hours) at 03/15/2019 0808 Last data filed at 03/15/2019 0445 Gross per 24 hour  Intake 563.33 ml  Output 2100 ml  Net -1536.67 ml      Physical Exam   PHYSICAL EXAM: General:  Elderly WF. No respiratory difficulty HEENT: normal Neck: supple. no JVD. Carotids 2+ bilat; no bruits. No lymphadenopathy or thyromegaly appreciated. Cor: PMI nondisplaced. irregularly irregular rhythm, regular rate. No rubs, gallops or murmurs. Lungs: clear Abdomen:  soft, nontender, nondistended. No hepatosplenomegaly. No bruits or masses. Good bowel sounds.  Extremities: no cyanosis, clubbing, rash, 2+ bilateral LEE w/ venous stasis dermatitis, w/ crusting wounds Neuro: alert & oriented x 3, cranial nerves grossly intact. moves all 4 extremities w/o difficulty. Affect pleasant.   Telemetry    Afib 70s personally reviewed.   EKG    N/a  Labs    CBC Recent Labs    03/14/19 0419 03/15/19 0502  WBC 6.4 5.9  HGB 8.1* 8.4*  HCT 26.8* 28.2*  MCV 87.6 87.9  PLT 258 384   Basic Metabolic Panel Recent Labs    03/14/19 0419 03/15/19 0502  NA 129* 133*  K 3.9 3.1*  CL 86* 88*  CO2 31 31  GLUCOSE 102* 94  BUN 87* 84*  CREATININE 2.11* 1.90*  CALCIUM 8.6* 8.8*   Liver Function Tests No results for input(s): AST, ALT, ALKPHOS, BILITOT, PROT, ALBUMIN in the last 72 hours. No results for input(s): LIPASE, AMYLASE in the last 72 hours. Cardiac Enzymes No results for input(s): CKTOTAL, CKMB, CKMBINDEX, TROPONINI in the last 72 hours.  BNP: BNP (last 3 results) Recent Labs    11/04/18 1715 01/20/19 1451 02/24/19 2051  BNP 369.3* 313.4* 314.7*    ProBNP (last 3 results) No results for input(s): PROBNP in the last 8760 hours.   D-Dimer No results for input(s): DDIMER in the last 72 hours. Hemoglobin A1C No results  for input(s): HGBA1C in the last 72 hours. Fasting Lipid Panel No results for input(s): CHOL, HDL, LDLCALC, TRIG, CHOLHDL, LDLDIRECT in the last 72 hours. Thyroid Function Tests No results for input(s): TSH, T4TOTAL, T3FREE, THYROIDAB in the last 72 hours.  Invalid input(s): FREET3  Other results:   Imaging    No results found.   Medications:     Scheduled Medications: . sodium chloride   Intravenous Once  . sodium chloride   Intravenous Once  . sodium chloride   Intravenous Once  . docusate sodium  100 mg Oral BID  . ezetimibe  10 mg Oral Daily  . [START ON 03/16/2019] ferrous sulfate  325 mg Oral Q  breakfast  . fluticasone  2 spray Each Nare Daily  . gabapentin  300 mg Oral BID  . hydrocerin   Topical BID  . hydrocortisone cream   Topical BID  . influenza vaccine adjuvanted  0.5 mL Intramuscular Tomorrow-1000  . levothyroxine  25 mcg Oral QAC breakfast  . lidocaine  1 patch Transdermal Q24H  . loratadine  10 mg Oral Daily  . magnesium oxide  400 mg Oral Daily  . pantoprazole  40 mg Oral Q0600  . potassium chloride  40 mEq Oral BID  . rosuvastatin  40 mg Oral Daily  . sodium chloride flush  3 mL Intravenous Once  . sodium chloride flush  3 mL Intravenous Q12H  . torsemide  60 mg Oral BID    Infusions: . sodium chloride 1,000 mL (03/04/19 0957)  . ferumoxytol Stopped (03/13/19 1847)    PRN Medications: sodium chloride, acetaminophen, bisacodyl, fentaNYL (SUBLIMAZE) injection, ondansetron **OR** ondansetron (ZOFRAN) IV, oxyCODONE **OR** oxyCODONE    Patient Profile   85 y/o woman with h/o diastolic HF with R>L symptoms, permanent AF (CHADSVaSC = 8), CKD III-IV with solitary kidney admitted with symptomatic anemia, hgb 5.8 on admit requiring transfusion. GI w/u suggestive of possible ischemic bowel. Warfarin stopped and now on apixaban. Major issue now is significant RHF with 2-3+ edema and with weeping lesions. Has previously refused wraps due to LE pain.   Assessment/Plan   1. Acute on chronic diastolic CHF: Echo in 2/72 with EF 60-65% with mild RV dilation. She has a Cardiomems device.She had remote nonischemic cardiomyopathy with recovery of EF, has Medtronic ICD. Suspect that there is significant RV dysfunction. Atrial fibrillation likely contributes, but this appears to be permanent now as she has failed multiple anti-arrhythmics and is reasonably rate-controlled. RHC7/6/20showed mild-moderately elevated PCWP and RA pressure, primarily pulmonary venous hypertension. Prominent v-waves on PCWP tracing may be due to stiff ventricle/diastolic dysfunction as she had only  mild MR on recent echo.Repeat RHC 11/2 showed ongoing volume overload. Treated w/ IV lasix, now on PO torsemide 60 mg bid - Unna booton right, cannot tolerate on left. - still w/ 2+ bilateral LEE. Continue Torsemide. Defer dose increase to MD 2. AKI on CKD stage 3: Lasix gtt stopped 11/4 due to rising SCr. Has been restarted on torsemide, but on lower dose compared to home regimen, currently on 60 bid. SCr improving, down from 2.11>>1.90 -continue to monitor w/ diuretics  3. Pulmonary hypertension: She has pulmonary venous hypertension. -Continue diuretics    4. Anemia: She has history of Fe deficiency anemia. Admitted with GI bleeding, colonoscopy suggestive of ischemic colitis (pathology confirmed). Has had transfusions. Also with diverticulosis. Hgb stable past 72 hrs ~8- GI has been following.  - With ongoing blood loss, Eliquis placed on hold.  Will keep her off  for a couple of weeks then try to restart once bowel has had some time to heal.   -Scheduled for capsule endoscopy today 5. Atrial fibrillation: Permanent. CVR in the 80s. Transitioned from warfarin to Eliquis with GI bleeding.  - As above, will stop Eliquis is on hold to allow healing.  Hopefully will be able to restart eventually.  6. Cellulitis: Lower legs, completed course of Keflex. 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 However, no "intestinal angina" reported. Seen by Dr. Doren Custard, recommended conservative management unless she were to develop clear symptoms of chronic mesenteric ischemia.  - She is on Crestor.  8. PAD: Bilateral leg pain and ulcerations with chronic ischemic skin changes.  Peripheral arterial dopplers showed significant bilateral PAD.  Seen by Dr. Scot Dock, he was concerned that she may not be able to heal leg wounds.  If worsens, will need CO2 angiography.    Lyda Jester, PA-C 03/15/2019 8:08 AM  Patient seen with PA, agree with the above note.    She  is stable clinically, creatinine lower at 1.8.  No overt bleeding. Hgb is higher today.   On exam, JVP 12-13 cm with stable bilateral lower leg edema and ulcerations.   Today I will increase torsemide to 80 mg bid.   Awaiting results of capsule endoscopy.  Plan to keep her off Eliquis for a couple of weeks regardless.   Loralie Champagne 03/15/2019 2:21 PM

## 2019-03-15 NOTE — Progress Notes (Signed)
Physical Therapy Treatment Patient Details Name: [name] MRN: [id number] DOB: 1933/12/28 Today's Date: 03/15/2019    History of Present Illness 85 y.o. female with history of A. fib, diastolic CHF last EF measured was 60 to 35% in June 2020, chronic anemia, CKD III with single functioning kidney, presents to the ER 02/24/19 with weakness, fall, SOB and LE edema. Worked up for anemia and CHF.  Pt with cardiac cath on 03/08/19 that showed volume overload per MD note.    PT Comments    Pt was seen for progressing her PT program, and was comfortable with PT walking her to BR and on hallway.  Pt is having a slow day of exercise and mobility tolerance, declining stairs to wait until later in the week.  Her daughter was present and reinforced the plan to wait despite pt being able to demonstrate sustained O2 sats on room air.  Follow up as able for work on encouraging all skills needed to get pt home.   Follow Up Recommendations  Home health PT;Supervision/Assistance - 24 hour     Equipment Recommendations  None recommended by PT    Recommendations for Other Services       Precautions / Restrictions Precautions Precautions: Fall Precaution Comments: monitor Lower leg skin Restrictions Weight Bearing Restrictions: No    Mobility  Bed Mobility Overal bed mobility: Modified Independent             General bed mobility comments: side of bed with no AD   Transfers Overall transfer level: Needs assistance Equipment used: Rolling walker (2 wheeled) Transfers: Sit to/from Stand Sit to Stand: Min guard         General transfer comment: reminders for hand placement in BR and on bed  Ambulation/Gait Ambulation/Gait assistance: Min guard Gait Distance (Feet): 120 Feet(20+100) Assistive device: Rolling walker (2 wheeled) Gait Pattern/deviations: Step-through pattern;Decreased stride length;Wide base of support Gait velocity: Decreased Gait velocity interpretation: <1.31  ft/sec, indicative of household ambulator General Gait Details: pt is limiting WB on LLE due to pain of skin   Stairs             Wheelchair Mobility    Modified Rankin (Stroke Patients Only)       Balance Overall balance assessment: Needs assistance Sitting-balance support: Feet supported Sitting balance-Leahy Scale: Good     Standing balance support: Bilateral upper extremity supported Standing balance-Leahy Scale: Fair                              Cognition Arousal/Alertness: Awake/alert Behavior During Therapy: WFL for tasks assessed/performed Overall Cognitive Status: Within Functional Limits for tasks assessed                     Current Attention Level: Sustained                  Exercises General Exercises - Lower Extremity Ankle Circles/Pumps: AROM;5 reps Long Arc Quad: AROM;10 reps Hip Flexion/Marching: AROM;10 reps    General Comments General comments (skin integrity, edema, etc.): pt is up to walk with RW and min guard, has some pain on LLE esp to walk and for any touching of skin to occur      Pertinent Vitals/Pain Pain Assessment: Faces Faces Pain Scale: Hurts even more Pain Location: ankles Pain Descriptors / Indicators: Burning Pain Intervention(s): Limited activity within patient's tolerance;Monitored during session;Repositioned    Home Living  Prior Function            PT Goals (current goals can now be found in the care plan section) Acute Rehab PT Goals Patient Stated Goal: get stronger and get LLE skin better Progress towards PT goals: Progressing toward goals    Frequency    Min 3X/week      PT Plan Current plan remains appropriate    Co-evaluation              AM-PAC PT "6 Clicks" Mobility   Outcome Measure  Help needed turning from your back to your side while in a flat bed without using bedrails?: None Help needed moving from lying on your back to  sitting on the side of a flat bed without using bedrails?: A Little Help needed moving to and from a bed to a chair (including a wheelchair)?: A Little Help needed standing up from a chair using your arms (e.g., wheelchair or bedside chair)?: A Little Help needed to walk in hospital room?: A Little Help needed climbing 3-5 steps with a railing? : A Little 6 Click Score: 19    End of Session Equipment Utilized During Treatment: Gait belt Activity Tolerance: Patient tolerated treatment well Patient left: in bed;with call bell/phone within reach;with bed alarm set Nurse Communication: Mobility status PT Visit Diagnosis: Other abnormalities of gait and mobility (R26.89);Unsteadiness on feet (R26.81)     Time: 4239-5320 PT Time Calculation (min) (ACUTE ONLY): 45 min  Charges:  $Gait Training: 8-22 mins $Therapeutic Exercise: 8-22 mins $Therapeutic Activity: 8-22 mins                Ramond Dial 03/15/2019, 7:45 PM   Mee Hives, PT MS Acute Rehab Dept. Number: [place]

## 2019-03-16 ENCOUNTER — Encounter (HOSPITAL_COMMUNITY): Payer: Self-pay | Admitting: Gastroenterology

## 2019-03-16 DIAGNOSIS — D62 Acute posthemorrhagic anemia: Secondary | ICD-10-CM

## 2019-03-16 LAB — CBC WITH DIFFERENTIAL/PLATELET
Abs Immature Granulocytes: 0.02 10*3/uL (ref 0.00–0.07)
Basophils Absolute: 0 10*3/uL (ref 0.0–0.1)
Basophils Relative: 1 %
Eosinophils Absolute: 0.1 10*3/uL (ref 0.0–0.5)
Eosinophils Relative: 1 %
HCT: 26.8 % — ABNORMAL LOW (ref 36.0–46.0)
Hemoglobin: 7.7 g/dL — ABNORMAL LOW (ref 12.0–15.0)
Immature Granulocytes: 0 %
Lymphocytes Relative: 15 %
Lymphs Abs: 0.9 10*3/uL (ref 0.7–4.0)
MCH: 26.2 pg (ref 26.0–34.0)
MCHC: 28.7 g/dL — ABNORMAL LOW (ref 30.0–36.0)
MCV: 91.2 fL (ref 80.0–100.0)
Monocytes Absolute: 0.9 10*3/uL (ref 0.1–1.0)
Monocytes Relative: 16 %
Neutro Abs: 3.7 10*3/uL (ref 1.7–7.7)
Neutrophils Relative %: 67 %
Platelets: 286 10*3/uL (ref 150–400)
RBC: 2.94 MIL/uL — ABNORMAL LOW (ref 3.87–5.11)
RDW: 23.4 % — ABNORMAL HIGH (ref 11.5–15.5)
WBC: 5.6 10*3/uL (ref 4.0–10.5)
nRBC: 0.4 % — ABNORMAL HIGH (ref 0.0–0.2)

## 2019-03-16 LAB — BASIC METABOLIC PANEL
Anion gap: 12 (ref 5–15)
BUN: 83 mg/dL — ABNORMAL HIGH (ref 8–23)
CO2: 31 mmol/L (ref 22–32)
Calcium: 8.6 mg/dL — ABNORMAL LOW (ref 8.9–10.3)
Chloride: 90 mmol/L — ABNORMAL LOW (ref 98–111)
Creatinine, Ser: 2.01 mg/dL — ABNORMAL HIGH (ref 0.44–1.00)
GFR calc Af Amer: 26 mL/min — ABNORMAL LOW (ref >60)
GFR calc non Af Amer: 22 mL/min — ABNORMAL LOW (ref >60)
Glucose, Bld: 104 mg/dL — ABNORMAL HIGH (ref 70–99)
Potassium: 3.1 mmol/L — ABNORMAL LOW (ref 3.5–5.1)
Sodium: 133 mmol/L — ABNORMAL LOW (ref 135–145)

## 2019-03-16 MED ORDER — TORSEMIDE 20 MG PO TABS
80.0000 mg | ORAL_TABLET | Freq: Two times a day (BID) | ORAL | Status: DC
  Administered 2019-03-16 – 2019-03-17 (×2): 80 mg via ORAL
  Filled 2019-03-16 (×2): qty 4

## 2019-03-16 MED ORDER — POTASSIUM CHLORIDE CRYS ER 20 MEQ PO TBCR
40.0000 meq | EXTENDED_RELEASE_TABLET | Freq: Once | ORAL | Status: AC
  Administered 2019-03-16: 40 meq via ORAL
  Filled 2019-03-16: qty 2

## 2019-03-16 MED ORDER — DOXYCYCLINE HYCLATE 100 MG PO TABS
100.0000 mg | ORAL_TABLET | Freq: Two times a day (BID) | ORAL | Status: DC
  Administered 2019-03-16 – 2019-03-17 (×3): 100 mg via ORAL
  Filled 2019-03-16 (×3): qty 1

## 2019-03-16 NOTE — Consult Note (Addendum)
[place] Nurse wound consult note  Reason for Consult: Place a Profore bandage onto left LE per Dr. Jerilynn Mages. Duda's order Wound type:Chronic venous insufficiency with acute cellulitis and swelling left LE Pressure Injury POA: N/A Measurement: N/A Wound bed: 15cm length x 15cm width of dry crusting on LLE.  Dry yellow/brown. Drainage (amount, consistency, odor) Small amount serous exudate Periwound: As above.  Dry crusting and mild elevation of crusts Dressing procedure/placement/frequency: Profore application:  LLE lateral aspect is covered with nonadherent wound contact layer, this is followed by spiral wrapping of layers 1 and 2 (cotton batting and light conformable bandage), figure of 8 wrapping of the light compression bandage and a spiral wrapping of the flexible cohesive bandage.  A spare dressing kit is in the room.  This is to be sent home with patient should she be discharged prior to next application.  I was assisted in this procedure by a staff nurse observing the dressing application who supported the patient's LE during the application.  Changes for LLE Profore will be on Tuesday and Friday this week and next.  While in house, the [facility] Nurses will perform.  At home, the [facility] will perform in conjunction with Dr. Jess Barters office.  Schedule to be determined  It is noted that the RLE Unna's Boot is becoming unraveled.  I have asked Unit Secretary to notify Ortho Tech for replacement.   [place] nursing team will follow, and will remain available to this patient, the nursing and medical teams.   Thanks, Maudie Flakes, MSN, RN, [place], Arther Abbott  Pager# [phone]

## 2019-03-16 NOTE — Progress Notes (Signed)
Patient refused weight this morning because she is not feeling well.  Will get bed weight and inform day shift.

## 2019-03-16 NOTE — TOC Progression Note (Signed)
Transition of Care [facility]) - Progression Note    Patient Details  Name: [name] MRN: [id number] Date of Birth: Aug 25, 1933  Transition [facility]) CM/SW Contact  Zenon Mayo, RN Phone Number: 03/16/2019, 7:55 PM  Clinical Narrative:     is set up with [facility], has eliquis coupon, for cath , capsule study last pm.  Hgb keeps dropping.  TOC team will cont to follow for TOC needs.    Expected Discharge Plan: [place] Barriers to Discharge: Continued Medical Work up  Expected Discharge Plan and Services Expected Discharge Plan: [place] In-house Referral: NA Discharge Planning Services: CM Consult Post Acute Care Choice: [place] arrangements for the past 2 months: Single Family Home                 DME Arranged: (NA)         HH Arranged: PT HH Agency: [facility] Date [place]: 03/03/19 Time [place]: 1127 Representative spoke with at [place]: [address] ([employer]) Interventions    Readmission Risk Interventions Readmission Risk Prevention Plan 02/26/2019 11/10/2018  Transportation Screening Complete Complete  PCP or Specialist Appt within 3-5 Days - Complete  HRI or [place] - Complete  Social Work Consult for [place] Planning/Counseling - Complete  Palliative Care Screening - Not Applicable  Medication Review Press photographer) Complete Complete  SW Recovery Care/Counseling Consult Complete -  Palliative Care Screening Not Applicable -  [place] Not Applicable -  Some recent data might be hidden

## 2019-03-16 NOTE — Progress Notes (Addendum)
Patient ID: [name], female   DOB: 04/05/1934, 85 y.o.   MRN: [id number]     Advanced Heart Failure Rounding Note  PCP-Cardiologist: Virl Axe, MD   Subjective:    Still with left leg pain L>R.    Hospital course also notable for anemia requiring several blood transfusions. Last transfusion 11/5. hgb stable over the weekend ~8 range. S/P capsule endoscopy today.   Bed weight down, I/O not accurate.   Seen by Dr. Sharol Given.  Wants to try and get her LLE wrapped. Complaining of ongoing leg pain. Denies SOB.     Objective:   Weight Range: 75.6 kg Body mass index is 26.91 kg/m.   Vital Signs:   Temp:  [97.5 F (36.4 C)-99.1 F (37.3 C)] 99.1 F (37.3 C) (11/10 0533) Pulse Rate:  [56-75] 56 (11/10 0533) Resp:  [16-18] 16 (11/10 0533) BP: (94-130)/(42-58) 94/52 (11/10 0533) SpO2:  [92 %-98 %] 94 % (11/10 0533) Last BM Date: 03/16/19  Weight change: Filed Weights   03/12/19 0027 03/14/19 0300 03/15/19 0624  Weight: 74.4 kg 75.8 kg 75.6 kg    Intake/Output:   Intake/Output Summary (Last 24 hours) at 03/16/2019 0913 Last data filed at 03/16/2019 0100 Gross per 24 hour  Intake 822 ml  Output 750 ml  Net 72 ml      Physical Exam   PHYSICAL EXAM: General:  Sitting on the side of the bed.  No resp difficulty HEENT: normal Neck: supple. JVP 11-12. Carotids 2+ bilat; no bruits. No lymphadenopathy or thryomegaly appreciated. Cor: PMI nondisplaced.  Irregular rate & rhythm. No rubs, gallops or murmurs. Lungs: clear Abdomen: soft, nontender, nondistended. No hepatosplenomegaly. No bruits or masses. Good bowel sounds. Extremities: no cyanosis, clubbing, rash, R and LLE 2+ edema. LLE erythema with dry crust.  Neuro: alert & orientedx3, cranial nerves grossly intact. moves all 4 extremities w/o difficulty. Affect flat   Telemetry    A Fib 70-80s personally reviewed.   EKG    N/a  Labs    CBC Recent Labs    03/15/19 0502 03/16/19 0429  WBC 5.9 5.6   NEUTROABS  --  3.7  HGB 8.4* 7.7*  HCT 28.2* 26.8*  MCV 87.9 91.2  PLT 282 263   Basic Metabolic Panel Recent Labs    03/15/19 0502 03/16/19 0429  NA 133* 133*  K 3.1* 3.1*  CL 88* 90*  CO2 31 31  GLUCOSE 94 104*  BUN 84* 83*  CREATININE 1.90* 2.01*  CALCIUM 8.8* 8.6*   Liver Function Tests No results for input(s): AST, ALT, ALKPHOS, BILITOT, PROT, ALBUMIN in the last 72 hours. No results for input(s): LIPASE, AMYLASE in the last 72 hours. Cardiac Enzymes No results for input(s): CKTOTAL, CKMB, CKMBINDEX, TROPONINI in the last 72 hours.  BNP: BNP (last 3 results) Recent Labs    11/04/18 1715 01/20/19 1451 02/24/19 2051  BNP 369.3* 313.4* 314.7*    ProBNP (last 3 results) No results for input(s): PROBNP in the last 8760 hours.   D-Dimer No results for input(s): DDIMER in the last 72 hours. Hemoglobin A1C No results for input(s): HGBA1C in the last 72 hours. Fasting Lipid Panel No results for input(s): CHOL, HDL, LDLCALC, TRIG, CHOLHDL, LDLDIRECT in the last 72 hours. Thyroid Function Tests No results for input(s): TSH, T4TOTAL, T3FREE, THYROIDAB in the last 72 hours.  Invalid input(s): FREET3  Other results:   Imaging    No results found.   Medications:     Scheduled Medications: .  sodium chloride   Intravenous Once  . sodium chloride   Intravenous Once  . sodium chloride   Intravenous Once  . docusate sodium  100 mg Oral BID  . doxycycline  100 mg Oral Q12H  . ezetimibe  10 mg Oral Daily  . ferrous sulfate  325 mg Oral Q breakfast  . fluticasone  2 spray Each Nare Daily  . gabapentin  300 mg Oral BID  . hydrocerin   Topical BID  . hydrocortisone cream   Topical BID  . influenza vaccine adjuvanted  0.5 mL Intramuscular Tomorrow-1000  . levothyroxine  25 mcg Oral QAC breakfast  . lidocaine  1 patch Transdermal Q24H  . loratadine  10 mg Oral Daily  . magnesium oxide  400 mg Oral Daily  . pantoprazole  40 mg Oral Q0600  . potassium  chloride  40 mEq Oral BID  . rosuvastatin  40 mg Oral Daily  . sodium chloride flush  3 mL Intravenous Once  . sodium chloride flush  3 mL Intravenous Q12H  . torsemide  60 mg Oral BID    Infusions: . sodium chloride 1,000 mL (03/04/19 0957)  . ferumoxytol Stopped (03/13/19 1847)    PRN Medications: sodium chloride, acetaminophen, bisacodyl, fentaNYL (SUBLIMAZE) injection, ondansetron **OR** ondansetron (ZOFRAN) IV, oxyCODONE **OR** oxyCODONE    Patient Profile   85 y/o woman with h/o diastolic HF with R>L symptoms, permanent AF (CHADSVaSC = 8), CKD III-IV with solitary kidney admitted with symptomatic anemia, hgb 5.8 on admit requiring transfusion. GI w/u suggestive of possible ischemic bowel. Warfarin stopped and now on apixaban. Major issue now is significant RHF with 2-3+ edema and with weeping lesions. Has previously refused wraps due to LE pain.   Assessment/Plan   1. Acute on chronic diastolic CHF: Echo in 9/44 with EF 60-65% with mild RV dilation. She has a Cardiomems device.She had remote nonischemic cardiomyopathy with recovery of EF, has Medtronic ICD. Suspect that there is significant RV dysfunction. Atrial fibrillation likely contributes, but this appears to be permanent now as she has failed multiple anti-arrhythmics and is reasonably rate-controlled. RHC7/6/20showed mild-moderately elevated PCWP and RA pressure, primarily pulmonary venous hypertension. Prominent v-waves on PCWP tracing may be due to stiff ventricle/diastolic dysfunction as she had only mild MR on recent echo.Repeat RHC 11/2 showed ongoing volume overload. Treated w/ IV lasix, now on PO torsemide.  Louretta Parma booton right, wants to try wrap on the left per Dr Gavin Potters request.  - Increase torsemide to 80 mg twice a day. K 3.1. Supp K.  2. AKI on CKD stage 3: Lasix gtt stopped 11/4 due to rising SCr. Has been restarted on torsemide, but on lower dose compared to home regimen, currently on 80 bid. SCr  improving, down from 2.11>>1.90>> 2.0   Check BMET in am.  3. Pulmonary hypertension: She has pulmonary venous hypertension. -Continue diuretics    4. Anemia: She has history of Fe deficiency anemia. Admitted with GI bleeding, colonoscopy suggestive of ischemic colitis (pathology confirmed). Has had transfusions. Also with diverticulosis. Hgb stable past 72 hrs ~8- GI has been following.  - With ongoing blood loss, Eliquis stopped.  Will keep her off for a couple of weeks then try to restart once bowel has had some time to heal.  Hgb trending 8.4>7.7   Capsule endoscopy 11/9 with results pending.  5. Atrial fibrillation: Permanent. Rate controlled.  Transitioned from warfarin to Eliquis with GI bleeding.  - As above, Eliquis is on hold to allow healing.  Hopefully will be able to restart eventually.  6. 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 However, no "intestinal angina" reported. Seen by Dr. Doren Custard, recommended conservative management unless she were to develop clear symptoms of chronic mesenteric ischemia.  - She is on Crestor.  8. PAD: Bilateral leg pain and ulcerations with chronic ischemic skin changes.  Peripheral arterial dopplers showed significant bilateral PAD.  Seen by Dr. Scot Dock, he was concerned that she may not be able to heal leg wounds.  If worsens, will need CO2 angiography.    Amy Clegg,NP-C  03/16/2019 9:13 AM  Patient seen with NP, agree with the above note.  Creatinine continues to fluctuate around 2.  Some volume overload on exam.   - Increase torsemide to 80 mg bid, follow renal indices closely.   Hgb trending down again, off apixaban.  Await capsule endoscopy result.  Would aim to keep her off apixaban for a couple of weeks to allow healing then restart.   Left leg needs to be wrapped per Dr. Jess Barters instructions.   Cardiology to follow at a distance at  this point.  Will keep an eye on her creatinine with torsemide use.   Loralie Champagne 03/16/2019 9:34 AM

## 2019-03-16 NOTE — Progress Notes (Addendum)
    Progress Note   Subjective  Chief Complaint: Anemia, GI bleed  Patient tells me she did have a melenic stool this morning when she woke up, she had "messed myself".  She believes this is from taking a laxative prior to capsule endoscopy yesterday.  Denies any other new complaints or concerns.   Objective   Vital signs in last 24 hours: Temp:  [97.6 F (36.4 C)-99.1 F (37.3 C)] 99.1 F (37.3 C) (11/10 0533) Pulse Rate:  [56-75] 56 (11/10 0533) Resp:  [16-18] 16 (11/10 0533) BP: (94-126)/(42-59) 126/59 (11/10 0900) SpO2:  [92 %-94 %] 94 % (11/10 0533) Last BM Date: 03/16/19 General:    white female in NAD Heart:  Regular rate and rhythm; no murmurs Lungs: Respirations even and unlabored, lungs CTA bilaterally Abdomen:  Soft, nontender and nondistended. Normal bowel sounds.  Intake/Output from previous day: 11/09 0701 - 11/10 0700 In: 822 [P.O.:822] Out: 750 [Urine:750]  Lab Results: Recent Labs    03/14/19 0419 03/15/19 0502 03/16/19 0429  WBC 6.4 5.9 5.6  HGB 8.1* 8.4* 7.7*  HCT 26.8* 28.2* 26.8*  PLT 258 282 286   BMET Recent Labs    03/14/19 0419 03/15/19 0502 03/16/19 0429  NA 129* 133* 133*  K 3.9 3.1* 3.1*  CL 86* 88* 90*  CO2 31 31 31   GLUCOSE 102* 94 104*  BUN 87* 84* 83*  CREATININE 2.11* 1.90* 2.01*  CALCIUM 8.6* 8.8* 8.6*      Assessment / Plan:   Assessment: 1.  Anemia: Ongoing with need for additional transfusions, iron deficient, day 6 of oral iron, 02/28/2019 EGD showed mild gastritis, colonoscopy with ischemic colitis and diverticulosis, TSH okay 2 weeks ago, got her 6th unit of PRBCs 03/13/2019, hemoglobin did decrease some overnight from 8.4--> 7.7, Feraheme infusion 11/6, currently on Protonix 40 mg p.o. daily 2.  Nausea: Better when PPI restarted 11/6  3.  Chronic AC for history of A. fib, nonischemic CM, history of TIA: Coumadin PTA stopped, reversed and Eliquis stopped 11/5  Plan: 1.  Awaiting results from capsule endoscopy,  discussed with family that they will either be in later today or by tomorrow morning. 2.  Continue to monitor hemoglobin with transfusion as needed <7 3.  Please await any further recommendations from Dr. Henrene Pastor later today  Thank you for your kind consultation, we will continue to follow.   LOS: 19 days   Levin Erp  03/16/2019, 12:24 PM  GI ATTENDING  Interval history data reviewed.  Agree with interval progress note as outlined above.  Clinically stable.  Awaiting capsule endoscopy review.  Docia Chuck. Geri Seminole., M.D. [facility] Division of Gastroenterology

## 2019-03-16 NOTE — Progress Notes (Signed)
PROGRESS NOTE    [name]  MLY:[id number] DOB: 1933-12-11 DOA: 02/24/2019 PCP: Elby Beck, FNP   Brief Narrative:  Patient is 6 old female with history of atrial fibrillation, status post pacemaker/defibrillator, chronic diastolic CHF, chronic anemia, hypothyroidism, CKD stage III with single functioning kidney who presents to the emergency department with increasing weakness, fall at home, leg swelling and shortness of breath.  Chest x-ray on presentation showed bilateral pleural effusion.  She was found to have bilateral significant lower extremity edema, hemoglobin of 6.2.  She was admitted for the management of acute on chronic CHF exacerbation and started on Lasix drip.  Cardiology following.Underwent right heart catheterization on 03/08/19 which showed volume overload.  She was started on Lasix drip.  Lasix drip has been stopped and she has been started on torsemide. Discharge planning canceled due to persistent drop in the hemoglobin.    GI following and awaiting for capsule endoscopy results .   Assessment & Plan:   Principal Problem:   Acute on chronic diastolic CHF (congestive heart failure), NYHA class 4 (HCC) Active Problems:   Permanent atrial fibrillation   COPD (chronic obstructive pulmonary disease) (HCC)   Hypertension   Chronic kidney disease (CKD), stage IV (severe) (HCC)   PAD (peripheral artery disease) (HCC)   S/p nephrectomy   Pulmonary hypertension (HCC)   Symptomatic anemia   AKI (acute kidney injury) ([place])   Melena   Chronic venous hypertension (idiopathic) with ulcer and inflammation of left lower extremity (HCC)   Acute on chronic diastolic CHF exacerbation: Presented with volume overload.  Ejection fraction 66-65% as per last echo.  On torsemide 100 mg twice a day at home.  She was  on Lasix drip at 15mg /hr. She was also given few days metalazone.  Bilateral lower extremity edema has improved.  Continue compression wrap ,unna boot on the  right.  Cardiology was following.  Lasix drip stopped and she has been started on torsemide 80 mg twice daily.  Symptomatic anemia: Transfusion dependent anemia.Reported of melanotic stools  She was transfused with several units of PRBCs during the hospital.  Hemoglobin this morning 7.7 she is not having any active bleeding.  GI following and awaiting results of  capsule endoscopy.Eliquis D/ced She was seen by GI earlier  and underwent colonoscopy and suspected  to have possible ischemic colitis on the left colon.   EGD showed mild gastritis, H. pylori negative.  Seen by vascular surgery for possible mesenteric ischemia.  No plan for intervention.    Left lower extremity cellulitis/chronic venous insufficiency: Completed the course of  keflex.   Wound care evaluation done and recommended eucerin cream  Left open to air.  Complains of persistent  burning pain on the left lower extremity.  I have recommended  Gabapentin but she is reluctant and she thinks it caused tremors.  Dr. Sharol Given saw her yesterday and he recommended 4-layer compression wrap, continue antibiotics and follow-up in a week after discharge.Started on doxycycline.  Permanent A. fib: Rate is well controlled.  .  Status post pacemaker/defibrillator.  She was on Eliquis which has been held.  Can restart as an outpatient.  Combined peripheral vascular disease/chronic venous insufficiency: Vascular surgery was consulted for possible mesenteric ischemia.  Also suspected peripheral vascular disease but ABI could not be obtained due to wounds on the left lower extremity.  Vascular surgery recommended follow-up as an outpatient.   There was concern of celiac/SMA stenosis but no postprandial symptoms.  No further treatment plan  unless develops clear for symptoms of chronic mesenteric ischemia as per vascular surgery.  Hyperlipidemia: On Crestor  Hypothyroidism: Synthyroid  AKI on CKD stage III: Has a solitary kidney/status post nephrectomy.   Currently kidney function is at baseline.  Hypokalemia: being upplemented         DVT prophylaxis: SCD Code Status: DNR Family Communication: Called daughter [name]  On phone on 03/14/19 Disposition Plan: Home after full work up.May be tomorrow   Consultants: Vascular, cardiology  Procedures: EGD/colonoscopy  Antimicrobials:  Anti-infectives (From admission, onward)   Start     Dose/Rate Route Frequency Ordered Stop   03/16/19 1000  doxycycline (VIBRA-TABS) tablet 100 mg     100 mg Oral Every 12 hours 03/16/19 0800     03/04/19 2200  cephALEXin (KEFLEX) capsule 500 mg     500 mg Oral Every 12 hours 03/04/19 1613 03/10/19 1001   03/01/19 1400  ceFAZolin (ANCEF) IVPB 1 g/50 mL premix  Status:  Discontinued     1 g 100 mL/hr over 30 Minutes Intravenous Every 8 hours 03/01/19 1033 03/01/19 1043   03/01/19 1045  ceFAZolin (ANCEF) 0.5 g in dextrose 5 % 100 mL IVPB  Status:  Discontinued     0.5 g 210 mL/hr over 30 Minutes Intravenous Every 12 hours 03/01/19 1043 03/04/19 1613      Subjective:  Patient seen and examined the bedside this morning.  Hemodynamically stable.  Sitting at the edge of the bed as always.  Complaints of dizziness, complains of burning pain on her left lower extremity.  Anxious to take gabapentin.  Objective: Vitals:   03/15/19 1130 03/15/19 2059 03/16/19 0533 03/16/19 0900  BP:  (!) 118/42 (!) 94/52 (!) 126/59  Pulse: 68 75 (!) 56   Resp:  18 16   Temp:  97.6 F (36.4 C) 99.1 F (37.3 C)   TempSrc:  Oral Oral   SpO2:  92% 94%   Weight:      Height:        Intake/Output Summary (Last 24 hours) at 03/16/2019 1245 Last data filed at 03/16/2019 0100 Gross per 24 hour  Intake 822 ml  Output 450 ml  Net 372 ml   Filed Weights   03/12/19 0027 03/14/19 0300 03/15/19 0624  Weight: 74.4 kg 75.8 kg 75.6 kg    Examination:  General exam: Pleasant elderly female, debilitated, deconditioned HEENT:PERRL,Oral mucosa moist, Ear/Nose normal on gross  exam Respiratory system: Bilateral equal air entry, normal vesicular breath sounds, no wheezes or crackles  Cardiovascular system: S1 & S2 heard, Afib. No JVD, murmurs, rubs, gallops or clicks.  1-2+  lower extremity edema.has pacemaker Gastrointestinal system: Abdomen is nondistended, soft and nontender. No organomegaly or masses felt. Normal bowel sounds heard. Central nervous system: Alert and oriented. No focal neurological deficits. Extremities: 1-2+ bilateral  lower extremity edema, erythematous changes, mild cellulitic changes on the LLE with skin breakdowns,white scabs Skin: no icterus ,no pallor   Data Reviewed: I have personally reviewed following labs and imaging studies  CBC: Recent Labs  Lab 03/12/19 0522  03/13/19 0416 03/13/19 1857 03/14/19 0419 03/15/19 0502 03/16/19 0429  WBC 7.0  --  7.3  --  6.4 5.9 5.6  NEUTROABS  --   --   --   --   --   --  3.7  HGB 6.6*   < > 6.8* 8.2* 8.1* 8.4* 7.7*  HCT 23.4*   < > 22.9* 27.2* 26.8* 28.2* 26.8*  MCV 86.7  --  85.8  --  87.6 87.9 91.2  PLT 292  --  252  --  258 282 286   < > = values in this interval not displayed.   Basic Metabolic Panel: Recent Labs  Lab 03/10/19 0348  03/12/19 0522 03/13/19 0416 03/14/19 0419 03/15/19 0502 03/16/19 0429  NA 136   < > 134* 131* 129* 133* 133*  K 3.5   < > 4.6 4.1 3.9 3.1* 3.1*  CL 96*   < > 94* 92* 86* 88* 90*  CO2 28   < > 28 26 31 31 31   GLUCOSE 96   < > 108* 105* 102* 94 104*  BUN 55*   < > 78* 85* 87* 84* 83*  CREATININE 1.52*   < > 1.88* 1.84* 2.11* 1.90* 2.01*  CALCIUM 8.8*   < > 8.5* 8.4* 8.6* 8.8* 8.6*  MG 2.1  --   --   --   --   --   --    < > = values in this interval not displayed.   GFR: Estimated Creatinine Clearance: 22 mL/min (A) (by C-G formula based on SCr of 2.01 mg/dL (H)). Liver Function Tests: No results for input(s): AST, ALT, ALKPHOS, BILITOT, PROT, ALBUMIN in the last 168 hours. No results for input(s): LIPASE, AMYLASE in the last 168 hours. No  results for input(s): AMMONIA in the last 168 hours. Coagulation Profile: No results for input(s): INR, PROTIME in the last 168 hours. Cardiac Enzymes: No results for input(s): CKTOTAL, CKMB, CKMBINDEX, TROPONINI in the last 168 hours. BNP (last 3 results) No results for input(s): PROBNP in the last 8760 hours. HbA1C: No results for input(s): HGBA1C in the last 72 hours. CBG: No results for input(s): GLUCAP in the last 168 hours. Lipid Profile: No results for input(s): CHOL, HDL, LDLCALC, TRIG, CHOLHDL, LDLDIRECT in the last 72 hours. Thyroid Function Tests: No results for input(s): TSH, T4TOTAL, FREET4, T3FREE, THYROIDAB in the last 72 hours. Anemia Panel: No results for input(s): VITAMINB12, FOLATE, FERRITIN, TIBC, IRON, RETICCTPCT in the last 72 hours. Sepsis Labs: No results for input(s): PROCALCITON, LATICACIDVEN in the last 168 hours.  No results found for this or any previous visit (from the past 240 hour(s)).       Radiology Studies: No results found.      Scheduled Meds: . sodium chloride   Intravenous Once  . sodium chloride   Intravenous Once  . sodium chloride   Intravenous Once  . docusate sodium  100 mg Oral BID  . doxycycline  100 mg Oral Q12H  . ezetimibe  10 mg Oral Daily  . ferrous sulfate  325 mg Oral Q breakfast  . fluticasone  2 spray Each Nare Daily  . gabapentin  300 mg Oral BID  . hydrocerin   Topical BID  . hydrocortisone cream   Topical BID  . influenza vaccine adjuvanted  0.5 mL Intramuscular Tomorrow-1000  . levothyroxine  25 mcg Oral QAC breakfast  . lidocaine  1 patch Transdermal Q24H  . loratadine  10 mg Oral Daily  . magnesium oxide  400 mg Oral Daily  . pantoprazole  40 mg Oral Q0600  . potassium chloride  40 mEq Oral BID  . potassium chloride  40 mEq Oral Once  . rosuvastatin  40 mg Oral Daily  . sodium chloride flush  3 mL Intravenous Once  . sodium chloride flush  3 mL Intravenous Q12H  . torsemide  80 mg Oral BID  Continuous Infusions: . sodium chloride 1,000 mL (03/04/19 0957)  . ferumoxytol Stopped (03/13/19 1847)     LOS: 19 days    Time spent: 25 mins.More than 50% of that time was spent in counseling and/or coordination of care.      Shelly Coss, MD [facility] Pager [phone]  If 7PM-7AM, please contact night-coverage [email or web address] Password [email or web address] 03/16/2019, 12:45 PM

## 2019-03-16 NOTE — Progress Notes (Signed)
Orthopedic Tech Progress Note Patient Details:  [name] [birth year] [id number]  Ortho Devices Type of Ortho Device: Louretta Parma boot Ortho Device/Splint Location: RLE Ortho Device/Splint Interventions: Ordered, Application   Post Interventions Patient Tolerated: Well Instructions Provided: Care of device   Braulio Bosch 03/16/2019, 10:59 PM

## 2019-03-17 ENCOUNTER — Other Ambulatory Visit: Payer: Self-pay

## 2019-03-17 DIAGNOSIS — K625 Hemorrhage of anus and rectum: Secondary | ICD-10-CM

## 2019-03-17 DIAGNOSIS — D509 Iron deficiency anemia, unspecified: Secondary | ICD-10-CM

## 2019-03-17 DIAGNOSIS — K921 Melena: Secondary | ICD-10-CM

## 2019-03-17 DIAGNOSIS — K5520 Angiodysplasia of colon without hemorrhage: Secondary | ICD-10-CM

## 2019-03-17 LAB — BASIC METABOLIC PANEL
Anion gap: 12 (ref 5–15)
BUN: 74 mg/dL — ABNORMAL HIGH (ref 8–23)
CO2: 26 mmol/L (ref 22–32)
Calcium: 8.4 mg/dL — ABNORMAL LOW (ref 8.9–10.3)
Chloride: 95 mmol/L — ABNORMAL LOW (ref 98–111)
Creatinine, Ser: 1.92 mg/dL — ABNORMAL HIGH (ref 0.44–1.00)
GFR calc Af Amer: 27 mL/min — ABNORMAL LOW (ref >60)
GFR calc non Af Amer: 24 mL/min — ABNORMAL LOW (ref >60)
Glucose, Bld: 124 mg/dL — ABNORMAL HIGH (ref 70–99)
Potassium: 4 mmol/L (ref 3.5–5.1)
Sodium: 133 mmol/L — ABNORMAL LOW (ref 135–145)

## 2019-03-17 LAB — CBC WITH DIFFERENTIAL/PLATELET
Abs Immature Granulocytes: 0.04 10*3/uL (ref 0.00–0.07)
Basophils Absolute: 0.1 10*3/uL (ref 0.0–0.1)
Basophils Relative: 1 %
Eosinophils Absolute: 0.1 10*3/uL (ref 0.0–0.5)
Eosinophils Relative: 1 %
HCT: 26.8 % — ABNORMAL LOW (ref 36.0–46.0)
Hemoglobin: 7.7 g/dL — ABNORMAL LOW (ref 12.0–15.0)
Immature Granulocytes: 1 %
Lymphocytes Relative: 15 %
Lymphs Abs: 0.8 10*3/uL (ref 0.7–4.0)
MCH: 26.3 pg (ref 26.0–34.0)
MCHC: 28.7 g/dL — ABNORMAL LOW (ref 30.0–36.0)
MCV: 91.5 fL (ref 80.0–100.0)
Monocytes Absolute: 0.8 10*3/uL (ref 0.1–1.0)
Monocytes Relative: 16 %
Neutro Abs: 3.4 10*3/uL (ref 1.7–7.7)
Neutrophils Relative %: 66 %
Platelets: 270 10*3/uL (ref 150–400)
RBC: 2.93 MIL/uL — ABNORMAL LOW (ref 3.87–5.11)
RDW: 24.1 % — ABNORMAL HIGH (ref 11.5–15.5)
WBC: 5.1 10*3/uL (ref 4.0–10.5)
nRBC: 0 % (ref 0.0–0.2)

## 2019-03-17 MED ORDER — TORSEMIDE 20 MG PO TABS: 80.0000 mg | ORAL_TABLET | Freq: Two times a day (BID) | ORAL | 0 refills | Status: DC

## 2019-03-17 MED ORDER — DOXYCYCLINE HYCLATE 100 MG PO TABS: 100.0000 mg | ORAL_TABLET | Freq: Two times a day (BID) | ORAL | 0 refills | Status: AC

## 2019-03-17 MED ORDER — LIDOCAINE 5 % EX PTCH: 1.0000 | MEDICATED_PATCH | CUTANEOUS | 0 refills | Status: DC

## 2019-03-17 MED ORDER — FERROUS SULFATE 325 (65 FE) MG PO TABS: 325.0000 mg | ORAL_TABLET | Freq: Two times a day (BID) | ORAL | 0 refills | Status: DC

## 2019-03-17 MED ORDER — POTASSIUM CHLORIDE CRYS ER 20 MEQ PO TBCR: 40.0000 meq | EXTENDED_RELEASE_TABLET | Freq: Two times a day (BID) | ORAL | 0 refills | Status: DC

## 2019-03-17 MED ORDER — GABAPENTIN 600 MG PO TABS: 300.0000 mg | ORAL_TABLET | Freq: Two times a day (BID) | ORAL | 0 refills | Status: DC

## 2019-03-17 NOTE — Discharge Summary (Signed)
Physician Discharge Summary  [name] TDD:[id number] DOB: 1934-02-25 DOA: 02/24/2019  PCP: Elby Beck, FNP  Admit date: 02/24/2019 Discharge date: 03/17/2019  Admitted From: Home Disposition:  Home  Discharge Condition:Stable CODE STATUS:FULL Diet recommendation: Heart Healthy   Brief/Interim Summary:  Patient is 67 old female with history of atrial fibrillation, status post pacemaker/defibrillator, chronic diastolic CHF, chronic anemia, hypothyroidism, CKD stage III with single functioning kidney who presents to the emergency department with increasing weakness, fall at home, leg swelling and shortness of breath.  Chest x-ray on presentation showed bilateral pleural effusion.  She was found to have bilateral significant lower extremity edema, hemoglobin of 6.2.  She was admitted for the management of acute on chronic CHF exacerbation and started on Lasix drip.  Cardiology following.Underwent right heart catheterization on 03/08/19 which showed volume overload.  She was started on Lasix drip.  Lasix drip has been stopped and she has been started on torsemide. Discharge planning canceled due to persistent drop in the hemoglobin.    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  She will follow-up with cardiology, GI as an outpatient.  Since she is hemodynamically stable she is being discharged today to home with home health.  Following problems were addressed during her hospitalization:  Acute on chronic diastolic CHF exacerbation: Presented with volume overload.  Ejection fraction 66-65% as per last echo.  On torsemide 100 mg twice a day at home.  She was  on Lasix drip at 15mg /hr. She was also given few days metalazone.  Bilateral lower extremity edema has improved.  Continue compression wrap ,unna boot on the right.  Cardiology was following.  Lasix drip stopped and  she has been started on torsemide 80 mg twice daily.  Symptomatic anemia: Transfusion dependent anemia.Reported of melanotic stools  She was transfused with several units of PRBCs during the hospital.  Hemoglobin this morning 7.7 she is not having any active bleeding.  GI following and she underwent  capsule endoscopy with findings as above.Eliquis D/ced She was seen by GI earlier  and underwent colonoscopy and suspected  to have possible ischemic colitis on the left colon.   EGD showed mild gastritis, H. pylori negative.  Seen by vascular surgery for possible mesenteric ischemia.  No plan for intervention.    Left lower extremity cellulitis/chronic venous insufficiency:   Wound care evaluation done and recommended eucerin cream  Left open to air.  Complains of persistent  burning pain on the left lower extremity.  I have recommended  Gabapentin. Dr. Sharol Given saw her and he recommended 4-layer compression wrap, continue antibiotics and follow-up in a week after discharge.Started on doxycycline.  Permanent A. fib: Rate is well controlled.  .  Status post pacemaker/defibrillator.  She was on Eliquis which has been held.  Can restart as an outpatient.  Combined peripheral vascular disease/chronic venous insufficiency: Vascular surgery was consulted for possible mesenteric ischemia.  Also suspected peripheral vascular disease but ABI could not be obtained due to wounds on the left lower extremity.  Vascular surgery recommended follow-up as an outpatient.   There was concern of celiac/SMA stenosis but no postprandial symptoms.  No further treatment plan unless develops clear for symptoms of chronic mesenteric ischemia as per vascular surgery.  Hyperlipidemia: On Crestor  Hypothyroidism: Synthyroid  AKI on CKD stage III: Has a solitary kidney/status post nephrectomy.  Currently kidney function is at baseline.  Hypokalemia: being upplemented     Discharge Diagnoses:  Principal Problem:   Acute  on chronic diastolic CHF (congestive heart failure), NYHA class 4 (HCC) Active Problems:   Permanent atrial fibrillation   COPD (chronic obstructive pulmonary disease) (HCC)   Hypertension   Chronic kidney disease (CKD), stage IV (severe) (HCC)   PAD (peripheral artery disease) (HCC)   S/p nephrectomy   Pulmonary hypertension (HCC)   Acute blood loss anemia   Symptomatic anemia   AKI (acute kidney injury) ([place])   Melena   Chronic venous hypertension (idiopathic) with ulcer and inflammation of left lower extremity (HCC)    Discharge Instructions  Discharge Instructions    Diet - low sodium heart healthy   Complete by: As directed    Discharge instructions   Complete by: As directed    1)Take prescribed medications as instructed. 2)Follow up with your PCP in a week.Do a CBC and BMP tests during the follow up. 3)You have appointments scheduled for follow up with cardiology and gastroenterology. 4)Please follow up with Dr Sharol Given in a week. Number of the provider has been attached.   Increase activity slowly   Complete by: As directed      Allergies as of 03/17/2019      Reactions   Dilaudid [hydromorphone] Other (See Comments)   Excessive Somnolence- Required Narcan   Fentanyl Other (See Comments)   Required Narcan when given with Dilaudid. Tolerated single dose when given during procedure 02/03/2019.    Codeine Nausea And Vomiting   Hallucinations    Morphine And Related Nausea And Vomiting   Hallucinations    Sulfonamide Derivatives Other (See Comments)   Dry mouth      Medication List    STOP taking these medications   aspirin 81 MG chewable tablet Commonly known as: Aspirin Childrens   IRON-C PO   metolazone 5 MG tablet Commonly known as: ZAROXOLYN   spironolactone 25 MG tablet Commonly known as: ALDACTONE     TAKE these medications   acetaminophen 500 MG tablet Commonly known as: TYLENOL Take 500-1,000 mg by mouth every 8 (eight) hours as needed for mild  pain or headache.   AZO Cranberry Gummies 500 MG Chew Generic drug: Cranberry Chew 2,000 mg by mouth daily.   bisacodyl 5 MG EC tablet Commonly known as: DULCOLAX Take 1 tablet (5 mg total) by mouth daily as needed for moderate constipation.   cetirizine 10 MG tablet Commonly known as: ZYRTEC Take 10 mg by mouth daily.   conjugated estrogens vaginal cream Commonly known as: Premarin Place 1 Applicatorful vaginally daily. What changed:   when to take this  reasons to take this   docusate sodium 100 MG capsule Commonly known as: COLACE Take 1 capsule (100 mg total) by mouth 2 (two) times daily.   doxycycline 100 MG tablet Commonly known as: VIBRA-TABS Take 1 tablet (100 mg total) by mouth every 12 (twelve) hours for 14 days.   ezetimibe 10 MG tablet Commonly known as: ZETIA Take 1 tablet (10 mg total) by mouth daily.   ferrous sulfate 325 (65 FE) MG tablet Take 1 tablet (325 mg total) by mouth 2 (two) times daily with a meal.   fluticasone 50 MCG/ACT nasal spray Commonly known as: FLONASE Place 2 sprays into both nostrils daily. What changed:   when to take this  reasons to take this   gabapentin 600 MG tablet Commonly known as: NEURONTIN Take 0.5 tablets (300 mg total) by mouth 2 (two) times  daily.   hydrocortisone 2.5 % cream Apply 1 application topically daily as needed (to itchy areas).   levothyroxine 25 MCG tablet Commonly known as: SYNTHROID Take 1 tablet (25 mcg total) by mouth daily before breakfast.   lidocaine 5 % Commonly known as: LIDODERM Place 1 patch onto the skin daily. Remove & Discard patch within 12 hours or as directed by MD Start taking on: March 18, 2019   magnesium oxide 400 MG tablet Commonly known as: MAG-OX Take 1 tablet (400 mg total) by mouth daily.   potassium chloride SA 20 MEQ tablet Commonly known as: KLOR-CON Take 2 tablets (40 mEq total) by mouth 2 (two) times daily. What changed: See the new instructions.    rosuvastatin 40 MG tablet Commonly known as: CRESTOR Take 1 tablet (40 mg total) by mouth daily. What changed: when to take this   torsemide 20 MG tablet Commonly known as: DEMADEX Take 4 tablets (80 mg total) by mouth 2 (two) times daily. What changed: how much to take   vitamin C 250 MG tablet Commonly known as: ASCORBIC ACID Take 500 mg by mouth daily.   warfarin 2 MG tablet Commonly known as: COUMADIN Take as directed. If you are unsure how to take this medication, talk to your nurse or doctor. Original instructions: Take 1 tablet (2 mg total) by mouth as directed. Take 2 mg on Monday- Saturday and 1 Mg on Sunday What changed:   how much to take  when to take this  additional instructions      Follow-up Information    [facility] Follow up.   Specialty: Home Health Services Why: HHPT Contact information: [place] [address] [phone]        Larey Dresser, MD Follow up on 04/19/2019.   Specialty: Cardiology Why: 2:40 PM  Contact information: [place] [address] [phone]        Newt Minion, MD Follow up in 1 week(s).   Specialty: Orthopedic Surgery Contact information: [place] [address] [phone]        Loralie Champagne, PA-C Follow up on 04/07/2019.   Specialty: Gastroenterology Why: 1:30 pm Contact information: [place] [address] [phone]        Elby Beck, FNP. Schedule an appointment as soon as possible for a visit in 1 week(s).   Specialties: Nurse Practitioner, Family Medicine Contact information: [address] [place] [address] [phone]          Allergies  Allergen Reactions  . Dilaudid [Hydromorphone] Other (See Comments)    Excessive Somnolence- Required Narcan  . Fentanyl Other (See Comments)    Required Narcan when given with Dilaudid. Tolerated single dose when given during procedure  02/03/2019.   Marland Kitchen Codeine Nausea And Vomiting    Hallucinations   . Morphine And Related Nausea And Vomiting    Hallucinations   . Sulfonamide Derivatives Other (See Comments)    Dry mouth    Consultations:  Cardiology, GI, vascular surgery   Procedures/Studies: Dg Chest 2 View  Result Date: 02/24/2019 CLINICAL DATA:  Worsening shortness of breath 1 week. Recent fall from chair. EXAM: CHEST - 2 VIEW COMPARISON:  11/04/2018 FINDINGS: Stable mild cardiomegaly. AICD remains in appropriate position stable small bilateral pleural effusions versus pleural thickening. No evidence of acute infiltrate or edema. IMPRESSION: Stable mild cardiomegaly and small bilateral pleural effusions versus pleural thickening. No acute findings. Electronically  Signed   By: Marlaine Hind M.D.   On: 02/24/2019 20:59   Ct Head Wo Contrast  Result Date: 02/24/2019 CLINICAL DATA:  Head trauma, X ataxia EXAM: CT HEAD WITHOUT CONTRAST TECHNIQUE: Contiguous axial images were obtained from the base of the skull through the vertex without intravenous contrast. COMPARISON:  October 28, 2016 FINDINGS: Brain: No evidence of acute territorial infarction, hemorrhage, hydrocephalus,extra-axial collection or mass lesion/mass effect. There is dilatation the ventricles and sulci consistent with age-related atrophy. Low-attenuation changes in the deep white matter consistent with small vessel ischemia. Vascular: No hyperdense vessel or unexpected calcification. Skull: The skull is intact. No fracture or focal lesion identified. Sinuses/Orbits: The visualized paranasal sinuses and mastoid air cells are clear. The orbits and globes intact. Other: None IMPRESSION: No acute intracranial abnormality. Findings consistent with age related atrophy and chronic small vessel ischemia Electronically Signed   By: Prudencio Pair M.D.   On: 02/24/2019 21:25   Dg Chest Port 1 View  Result Date: 02/25/2019 CLINICAL DATA:  Hypoxia EXAM: PORTABLE CHEST 1 VIEW  COMPARISON:  02/24/2019 FINDINGS: LEFT-sided AICD leads overlie the RIGHT atrium and RIGHT ventricle. Heart size is enlarged and stable in configuration. Stable appearance of pulmonary arterial pressure monitor. Small bilateral pleural effusions are stable. No edema or consolidation. IMPRESSION: Stable cardiomegaly and small bilateral pleural effusions. Electronically Signed   By: Nolon Nations M.D.   On: 02/25/2019 21:07   Vas [place] Burnard Bunting With/wo Tbi  Result Date: 03/03/2019 LOWER EXTREMITY DOPPLER STUDY Indications: Peripheral artery disease, and venous stasis.  Limitations: Today's exam was limited due to bandages , an open wound and              patient intolerant to cuff pressure. Comparison Study: No prior study on file for comparison Performing Technologist: Sharion Dove RVS  Examination Guidelines: A complete evaluation includes at minimum, Doppler waveform signals and systolic blood pressure reading at the level of bilateral brachial, anterior tibial, and posterior tibial arteries, when vessel segments are accessible. Bilateral testing is considered an integral part of a complete examination. Photoelectric Plethysmograph (PPG) waveforms and toe systolic pressure readings are included as required and additional duplex testing as needed. Limited examinations for reoccurring indications may be performed as noted.  ABI Findings: +---------+------------------+-----+----------+--------+ Right    Rt Pressure (mmHg)IndexWaveform  Comment  +---------+------------------+-----+----------+--------+ Brachial                        triphasic          +---------+------------------+-----+----------+--------+ PTA                             monophasic         +---------+------------------+-----+----------+--------+ DP                              monophasic         +---------+------------------+-----+----------+--------+ Great Toe38                0.31                     +---------+------------------+-----+----------+--------+ +---------+------------------+-----+----------+-------+ Left     Lt Pressure (mmHg)IndexWaveform  Comment +---------+------------------+-----+----------+-------+ Brachial 121                    triphasic         +---------+------------------+-----+----------+-------+ PTA  monophasic        +---------+------------------+-----+----------+-------+ DP                              monophasic        +---------+------------------+-----+----------+-------+ Great Toe30                0.25                   +---------+------------------+-----+----------+-------+ Unable to get pressures. Waveforms and toe pressures are abnormal  Summary: Right: The right toe-brachial index is abnormal. Unable to get pressures. Waveforms and toe pressures are abnormal.  *See table(s) above for measurements and observations.  Electronically signed by Deitra Mayo MD on 03/03/2019 at 4:50:38 PM.   Final    Vas [place] Mesenteric  Result Date: 03/02/2019 ABDOMINAL VISCERAL Indications: Anemia Limitations: Air/bowel gas and patient discomfort. Performing Technologist: Antonieta Pert RDMS, RVT  Examination Guidelines: A complete evaluation includes B-mode imaging, spectral Doppler, color Doppler, and power Doppler as needed of all accessible portions of each vessel. Bilateral testing is considered an integral part of a complete examination. Limited examinations for reoccurring indications may be performed as noted.  Duplex Findings: +----------------------+--------+--------+------+-------------------+ Mesenteric            PSV cm/sEDV cm/sPlaque     Comments       +----------------------+--------+--------+------+-------------------+ Aorta Mid                63      20                             +----------------------+--------+--------+------+-------------------+ Aorta Distal             74      21                              +----------------------+--------+--------+------+-------------------+ Celiac Artery Origin    318      46           > 70% stenosis    +----------------------+--------+--------+------+-------------------+ Celiac Artery Proximal  205      20                             +----------------------+--------+--------+------+-------------------+ SMA Origin              291      22           > 70% stenosis    +----------------------+--------+--------+------+-------------------+ SMA Proximal            249      27                             +----------------------+--------+--------+------+-------------------+ SMA Mid                 210      38                             +----------------------+--------+--------+------+-------------------+ SMA Distal              126      29                             +----------------------+--------+--------+------+-------------------+  CHA                     126      24                             +----------------------+--------+--------+------+-------------------+ IMA                     161      40         not well visualized +----------------------+--------+--------+------+-------------------+    Summary: Largest Aortic Diameter: 2.5 cm  Mesenteric: 70 to 99% stenosis in the celiac artery and superior mesenteric artery. Areas of limited visceral study include inferior mesenteric artery. Somewhat limited evaluation of mesenteric vasculature, IMA not identified on this exam due to overlaying bowel and gas.  *See table(s) above for measurements and observations.  Diagnosing physician: Deitra Mayo MD  Electronically signed by Deitra Mayo MD on 03/02/2019 at 11:52:28 AM.    Final    Ct Renal Stone Study  Result Date: 02/25/2019 CLINICAL DATA:  Flank pain. EXAM: CT ABDOMEN AND PELVIS WITHOUT CONTRAST TECHNIQUE: Multidetector CT imaging of the abdomen and pelvis was performed following the standard protocol  without IV contrast. COMPARISON:  July 22, 2005. FINDINGS: Lower chest: Small bilateral pleural effusions are noted. Hepatobiliary: No focal liver abnormality is seen. Status post cholecystectomy. No biliary dilatation. Pancreas: Unremarkable. No pancreatic ductal dilatation or surrounding inflammatory changes. Spleen: Normal in size without focal abnormality. Adrenals/Urinary Tract: Status post right nephrectomy. Right adrenal gland is not clearly visualized. Left adrenal gland and kidney are unremarkable. No hydronephrosis or renal obstruction is noted. Urinary bladder is not well visualized due to bilateral scatter artifact arising from bilateral hip prostheses. Stomach/Bowel: The stomach is unremarkable. There is no evidence of bowel obstruction or ileus. The appendix is not visualized. Vascular/Lymphatic: Aortic atherosclerosis. No enlarged abdominal or pelvic lymph nodes. Reproductive: Status post hysterectomy. No adnexal masses. Other: No abdominal wall hernia or abnormality. No abdominopelvic ascites. Musculoskeletal: Old L1 compression fracture is noted. No acute osseous abnormality is noted. IMPRESSION: Small bilateral pleural effusions are noted. Status post right nephrectomy. No hydronephrosis or renal obstruction is noted. Aortic Atherosclerosis (ICD10-I70.0). Electronically Signed   By: Marijo Conception M.D.   On: 02/25/2019 07:59       Subjective: Patient seen and examined the bedside this morning.  Hemodynamically stable for discharge.  Discharge Exam: Vitals:   03/17/19 0925 03/17/19 1132  BP:  123/71  Pulse:  70  Resp:  20  Temp:    SpO2: 100% 94%   Vitals:   03/16/19 1935 03/17/19 0434 03/17/19 0925 03/17/19 1132  BP:  (!) 99/57  123/71  Pulse: 80 62  70  Resp:  18  20  Temp:  98.4 F (36.9 C)    TempSrc:  Oral    SpO2: 96% 96% 100% 94%  Weight:  75.6 kg    Height:        General: Pt is alert, awake, not in acute distress Cardiovascular: RRR, S1/S2 +, no rubs, no  gallops Respiratory: CTA bilaterally, no wheezing, no rhonchi Abdominal: Soft, NT, ND, bowel sounds + Extremities: Bilateral lower extremity edema, chronic venous stasis changes on left lower extremity ,no cyanosis    The results of significant diagnostics from this hospitalization (including imaging, microbiology, ancillary and laboratory) are listed below for reference.     Microbiology: No results found for this or  any previous visit (from the past 240 hour(s)).   Labs: BNP (last 3 results) Recent Labs    11/04/18 1715 01/20/19 1451 02/24/19 2051  BNP 369.3* 313.4* 124.5*   Basic Metabolic Panel: Recent Labs  Lab 03/13/19 0416 03/14/19 0419 03/15/19 0502 03/16/19 0429 03/17/19 0346  NA 131* 129* 133* 133* 133*  K 4.1 3.9 3.1* 3.1* 4.0  CL 92* 86* 88* 90* 95*  CO2 26 31 31 31 26   GLUCOSE 105* 102* 94 104* 124*  BUN 85* 87* 84* 83* 74*  CREATININE 1.84* 2.11* 1.90* 2.01* 1.92*  CALCIUM 8.4* 8.6* 8.8* 8.6* 8.4*   Liver Function Tests: No results for input(s): AST, ALT, ALKPHOS, BILITOT, PROT, ALBUMIN in the last 168 hours. No results for input(s): LIPASE, AMYLASE in the last 168 hours. No results for input(s): AMMONIA in the last 168 hours. CBC: Recent Labs  Lab 03/13/19 0416 03/13/19 1857 03/14/19 0419 03/15/19 0502 03/16/19 0429 03/17/19 0346  WBC 7.3  --  6.4 5.9 5.6 5.1  NEUTROABS  --   --   --   --  3.7 3.4  HGB 6.8* 8.2* 8.1* 8.4* 7.7* 7.7*  HCT 22.9* 27.2* 26.8* 28.2* 26.8* 26.8*  MCV 85.8  --  87.6 87.9 91.2 91.5  PLT 252  --  258 282 286 270   Cardiac Enzymes: No results for input(s): CKTOTAL, CKMB, CKMBINDEX, TROPONINI in the last 168 hours. BNP: Invalid input(s): POCBNP CBG: No results for input(s): GLUCAP in the last 168 hours. D-Dimer No results for input(s): DDIMER in the last 72 hours. Hgb A1c No results for input(s): HGBA1C in the last 72 hours. Lipid Profile No results for input(s): CHOL, HDL, LDLCALC, TRIG, CHOLHDL, LDLDIRECT in  the last 72 hours. Thyroid function studies No results for input(s): TSH, T4TOTAL, T3FREE, THYROIDAB in the last 72 hours.  Invalid input(s): FREET3 Anemia work up No results for input(s): VITAMINB12, FOLATE, FERRITIN, TIBC, IRON, RETICCTPCT in the last 72 hours. Urinalysis    Component Value Date/Time   COLORURINE STRAW (A) 02/26/2019 2030   APPEARANCEUR CLEAR 02/26/2019 2030   APPEARANCEUR Cloudy (A) 01/27/2018 1330   LABSPEC 1.006 02/26/2019 2030   [place] 6.0 02/26/2019 2030   GLUCOSEU NEGATIVE 02/26/2019 2030   [place] NEGATIVE 02/26/2019 2030   HGBUR large 11/16/2009 [place] 02/26/2019 2030   BILIRUBINUR neg 02/02/2019 1524   BILIRUBINUR Negative 01/27/2018 1330   [place] 02/26/2019 2030   PROTEINUR NEGATIVE 02/26/2019 2030   UROBILINOGEN 0.2 02/02/2019 1524   UROBILINOGEN 1.0 08/16/2014 1754   NITRITE NEGATIVE 02/26/2019 2030   LEUKOCYTESUR TRACE (A) 02/26/2019 2030   Sepsis Labs Invalid input(s): PROCALCITONIN,  WBC,  LACTICIDVEN Microbiology No results found for this or any previous visit (from the past 240 hour(s)).  Please note: You were cared for by a hospitalist during your hospital stay. Once you are discharged, your primary care physician will handle any further medical issues. Please note that NO REFILLS for any discharge medications will be authorized once you are discharged, as it is imperative that you return to your primary care physician (or establish a relationship with a primary care physician if you do not have one) for your post hospital discharge needs so that they can reassess your need for medications and monitor your lab values.    Time coordinating discharge: 40 minutes  SIGNED:   Shelly Coss, MD  [facility] 03/17/2019, 1:01 PM Pager [phone]  If 7PM-7AM, please contact night-coverage [email or web address] Password [email or web address]

## 2019-03-17 NOTE — TOC Transition Note (Signed)
Transition of Care [facility]) - CM/SW Discharge Note Marvetta Gibbons RN, BSN Transitions of Care Unit 3E cross coverage 11/11- RN Case Manager [phone]   Patient Details  Name: [name] MRN: [id number] Date of Birth: 16-Sep-1933  Transition of Care [facility]) CM/SW Contact:  Dawayne Patricia, RN Phone Number: 03/17/2019, 1:58 PM   Clinical Narrative:    Pt stable for transition home today, [place] orders placed for HHRN/PT/aide- per previous Cm note referral has been sent to [facility]- notified Cassie with [facility] [name] and d/c home today   Final next level of care: Home w Home Health Services Barriers to Discharge: Barriers Resolved   Patient Goals and CMS Choice Patient states their goals for this hospitalization and ongoing recovery are:: go home CMS Medicare.gov Compare Post Acute Care list provided to:: Patient Represenative (must comment) Choice offered to / list presented to : Adult Children(cathy)  Discharge Placement                       Discharge Plan and Services In-house Referral: NA Discharge Planning Services: CM Consult Post Acute Care Choice: Home Health          DME Arranged: (NA)         HH Arranged: RN, PT, Nurse's Aide [place] Agency: [facility] Date [place]: 03/03/19 Time [place]: [id number] Representative spoke with at [place] ([place]) Interventions     Readmission Risk Interventions Readmission Risk Prevention Plan 03/17/2019 02/26/2019 11/10/2018  Transportation Screening Complete Complete Complete  PCP or Specialist Appt within 3-5 Days - - Complete  HRI or [place] - - Complete  Social Work Consult for [place] Planning/Counseling - - Complete  Palliative Care Screening - - Not Applicable  Medication Review Press photographer) Complete Complete Complete  PCP or Specialist appointment within 3-5 days of discharge Complete - -  [place] or Home Care  Consult Complete - -  SW Recovery Care/Counseling Consult Complete Complete -  Palliative Care Screening Not Applicable Not Applicable -  [place] Not Applicable Not Applicable -  Some recent data might be hidden

## 2019-03-17 NOTE — Progress Notes (Addendum)
     [facility] Progress Note  CC:  Anemia, GI bleed  Subjective:  Hgb 7.7 grams today, stable.  Says that she is still having bowel movements from the bowel prep.  Still dark, but on PO iron.  Capsule endoscopy showed a duodenal AVM, non-bleeding and a small streak of blood downstream from there.  Objective:  Vital signs in last 24 hours: Temp:  [97.7 F (36.5 C)-98.4 F (36.9 C)] 98.4 F (36.9 C) (11/11 0434) Pulse Rate:  [58-80] 62 (11/11 0434) Resp:  [18-20] 18 (11/11 0434) BP: (99-126)/(57-72) 99/57 (11/11 0434) SpO2:  [88 %-96 %] 96 % (11/11 0434) Weight:  [75.6 kg] 75.6 kg (11/11 0434) Last BM Date: 03/16/19 General:  Alert, Well-developed, in NAD Heart:  Irregularly irregular. Pulm:  CTAB.  No increased WOB. Abdomen:  Soft, non-distended.  BS present.  Non-tender. Extremities:  Without edema. Neurologic:  Alert and oriented x 4;  grossly normal neurologically. Psych:  Alert and cooperative. Normal mood and affect.  Intake/Output from previous day: 11/10 0701 - 11/11 0700 In: 480 [P.O.:480] Out: -   Lab Results: Recent Labs    03/15/19 0502 03/16/19 0429 03/17/19 0346  WBC 5.9 5.6 5.1  HGB 8.4* 7.7* 7.7*  HCT 28.2* 26.8* 26.8*  PLT 282 286 270   BMET Recent Labs    03/15/19 0502 03/16/19 0429 03/17/19 0346  NA 133* 133* 133*  K 3.1* 3.1* 4.0  CL 88* 90* 95*  CO2 31 31 26   GLUCOSE 94 104* 124*  BUN 84* 83* 74*  CREATININE 1.90* 2.01* 1.92*  CALCIUM 8.8* 8.6* 8.4*   Assessment / Plan: 1.  Anemia: Ongoing with need for additional transfusions, iron deficient. 02/28/2019 EGD showed mild gastritis, colonoscopy with ischemic colitis and diverticulosis, TSH okay 2 weeks ago, got her 6th unit of PRBCs 03/13/2019, hemoglobin did decrease some overnight from 8.4--> 7.7 grams again this morning, the same as yesterday.  Feraheme infusion 11/6, currently on Protonix 40 mg p.o. daily.  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 2.  Chronic AC for history of A. fib, nonischemic CM, history of TIA: Coumadin PTA stopped, reversed and Eliquis stopped 11/5  -Discussed with patient regarding capsule endoscopy findings.  Will just monitor for now.  OK for discharge today from GI standpoint.  Will recheck CBC next week, orders are in and she can have it drawn at PCP's office.  Follow-up appt in our office scheduled with me for 12/2 and is in discharge information.  Ok to resume anticoagulation if absolutely necessary and would just have to monitor.   LOS: 20 days   Laban Emperor. Zehr  03/17/2019, 8:51 AM   GI ATTENDING  Interval history data reviewed.  Patient seen and examined.  Agree with interval progress note.  Capsule endoscopy with minimal findings as described.  No active bleeding and stable hemoglobin.  Difficult situation as her recurrent bleeding may be small bowel in etiology and instigated by anticoagulation therapy.  No additional recommendations or work-up from GI perspective.  Will sign off.  Docia Chuck. Geri [place]in[place]., M.D. [facility] Division of Gastroenterology

## 2019-03-17 NOTE — Progress Notes (Addendum)
Patient's discharge instruction are given to her but she wants me to go over them again when her granddaughter comes to pick her up.  Discharge instructions given to patient and her granddaughter. All questions answered to her satisfaction.

## 2019-03-18 ENCOUNTER — Telehealth: Payer: Self-pay | Admitting: Family Medicine

## 2019-03-18 NOTE — Telephone Encounter (Signed)
Spoke with patient's Granddaughter [name] - she is very concerned with the patient's current state and is requesting recommendations from [place].   It was discussed with Jackelyn Poling regarding the patient's D/C home with instructions to follow up with labs within 1 week. [name] states that they were advised to follow up ASAP within 1 day to have Hgb levels checked. An appt was scheduled for 03/19/2019 with Jackelyn Poling but [name] states that the patient is way to weak and ill to bring to the office - getting her inside last night when they got her home from the Hospital was almost impossible. Debbie agreed that she did not need to come into the office since she is so ill. Jackelyn Poling is going to reach out to [facility] (referral placed upon d/c) to see about getting her set up with a nurse and labs and will discuss further recommendations. There is a lot of concern with her Hgb levels not being managed and dropping to critical levels within a 12-24hr time frame. She is concerned that with the patient already not feeling well that she is not able to express when she feels her Hgb is dropping. Pt is not being infused at this time (which is the only thing that was keeping her Hgb levels stable) - pt was advised to get outpatient infusions set up through PCP asap. Patient is set up with Alonza Bogus, PA with [facility] to access her slow GI Bleed. Jackelyn Poling is going to try and set up a process to get the patient taken care of quickly. Jackelyn Poling stated that she was going to call the Granddaughter and would discuss this with her.   I called Christy back and made her aware to keep her phone nearby as Jackelyn Poling was going to be reaching out to her soon.  Explained that Jackelyn Poling is going to review all hospital notes and labs - advised that for now we are going to keep her appt as scheduled until [place] specifies to cancel.   Will send to [facility]

## 2019-03-18 NOTE — Telephone Encounter (Signed)
Transition Care Management Follow-up Telephone Call   Date discharged? 03/17/2019   How have you been since you were released from the hospital? Discussed with Granddaughter [name] - "She is very weak, sleeping a lot and not able to do a lot on her own. She needs help with a lot of things she does d/t being so weak."   Do you understand why you were in the hospital? yes   Do you understand the discharge instructions? yes   Where were you discharged to? Home - Yolanda Bonine is staying with her around the clock and granddaughter [name] is checking in with her daily.    Items Reviewed:  Medications reviewed: yes  Allergies reviewed: yes  Dietary changes reviewed: yes  Referrals reviewed: yes   Functional Questionnaire:   Activities of Daily Living (ADLs):   She states they are independent in the following: Can do some things by herself but she does need a lot of help because she becomes warn out States they require assistance with the following: daily routines and care   Any transportation issues/concerns?: no   Any patient concerns? yes, See Above. Conversation with Jackelyn Poling via telephone regarding patients granddaughter's concerns.    Confirmed importance and date/time of follow-up visits scheduled yes, current appt scheduled is likely going to be cancelled due to patient being so weak and sick - should be seen within 1 week at least.  Provider Appointment booked with Clarene Reamer, [place] on 03/19/2019 at 2pm  Confirmed with patient if condition begins to worsen call PCP or go to the ER.  Patient was given the office number and encouraged to call back with question or concerns.  : yes

## 2019-03-19 ENCOUNTER — Telehealth: Payer: Self-pay

## 2019-03-19 ENCOUNTER — Ambulatory Visit: Payer: PPO | Admitting: Family Medicine

## 2019-03-19 DIAGNOSIS — D649 Anemia, unspecified: Secondary | ICD-10-CM | POA: Diagnosis not present

## 2019-03-19 DIAGNOSIS — I5033 Acute on chronic diastolic (congestive) heart failure: Secondary | ICD-10-CM | POA: Diagnosis not present

## 2019-03-19 DIAGNOSIS — M6281 Muscle weakness (generalized): Secondary | ICD-10-CM | POA: Diagnosis not present

## 2019-03-19 DIAGNOSIS — Z905 Acquired absence of kidney: Secondary | ICD-10-CM | POA: Diagnosis not present

## 2019-03-19 DIAGNOSIS — L97921 Non-pressure chronic ulcer of unspecified part of left lower leg limited to breakdown of skin: Secondary | ICD-10-CM | POA: Diagnosis not present

## 2019-03-19 DIAGNOSIS — I87312 Chronic venous hypertension (idiopathic) with ulcer of left lower extremity: Secondary | ICD-10-CM | POA: Diagnosis not present

## 2019-03-19 DIAGNOSIS — J449 Chronic obstructive pulmonary disease, unspecified: Secondary | ICD-10-CM | POA: Diagnosis not present

## 2019-03-19 DIAGNOSIS — N183 Chronic kidney disease, stage 3 unspecified: Secondary | ICD-10-CM | POA: Diagnosis not present

## 2019-03-19 DIAGNOSIS — I4821 Permanent atrial fibrillation: Secondary | ICD-10-CM | POA: Diagnosis not present

## 2019-03-19 DIAGNOSIS — I130 Hypertensive heart and chronic kidney disease with heart failure and stage 1 through stage 4 chronic kidney disease, or unspecified chronic kidney disease: Secondary | ICD-10-CM | POA: Diagnosis not present

## 2019-03-19 DIAGNOSIS — Z7689 Persons encountering health services in other specified circumstances: Secondary | ICD-10-CM | POA: Diagnosis not present

## 2019-03-19 NOTE — Telephone Encounter (Signed)
I did some digging in the chart and was able to find the correct phone number for the [facility] Nurse "Mo"  Her correct number is [phone] I have given her Debbie's cell number to communicate labs results once she has received them Mo states that she dropped off the labwork as STAT and received a confirmation notice for them. She is going to call the home office and see if the results are back in. She will reach out once she knows further information.

## 2019-03-19 NOTE — Telephone Encounter (Signed)
Called and spoke with Mo. Will get INR and CBC on Monday, 03/22/19.

## 2019-03-19 NOTE — Telephone Encounter (Signed)
Please call [facility] nurse and give her verbal order for INR for Monday, 03/22/2019.

## 2019-03-19 NOTE — Telephone Encounter (Signed)
HHN/Case manager was called and spoke to her about patients wound orders; to continue her wraps until seen in our office on 03/22/2019 per Dr Sharol Given orders. [phone] ATTN: [facility] to fax updated orders when available

## 2019-03-19 NOTE — Telephone Encounter (Signed)
Spoke with patient's granddaughter, [name], on 03/18/2019. Currie Paris, RN, at [facility] who was working to get patient scheduled to be seen 11/12 or 03/19/2019. Provided verbal order for stat labs. Benjamine Mola to let me know of there is any delay in being able to see patient. Reviewed medications with [name] who was unsure of anticoagulation. Per discharge, patient was discharged home on warfarin. Will coordinate follow up INR testing with Leticia Penna.

## 2019-03-19 NOTE — Telephone Encounter (Signed)
Home health agency LM on triage phone stating that they need wound care orders as well as frequency for patient. Please call her back to advise. [phone]

## 2019-03-19 NOTE — Telephone Encounter (Signed)
Spoke with [name], aware that we can wait until labs come back before doing a visit.  She stated that she called her Grandmother to see how her visit went and found out that [name] was not at the house yet. She thought they were earlier when we spoke but she was mistaken. She states that they were very strict on getting the labs drawn before 12pm today so that they would come back today for [name] to review so hopefully they will be out soon. She is going to reach out to the RN to see where she is.   Will send as FYI.

## 2019-03-19 NOTE — Telephone Encounter (Signed)
Mot with [facility] left v/m requesting cb to see if Glenda Chroman FNP wants to give verbal orders for in home INR cks since pt is on warfarin or should pt continue getting INR cks at office.Please advise.

## 2019-03-19 NOTE — Telephone Encounter (Signed)
Noted  

## 2019-03-19 NOTE — Telephone Encounter (Signed)
Unless they feel the need for a visit, we can wait until labs are returned.

## 2019-03-19 NOTE — Telephone Encounter (Signed)
ATC number listed below, person answered and when I requested to speak with Nurse named "Mot" she told me I had the wrong number and hung up.   I attempted to call again and line rang then went into voicemail - unable to identify if this voicemail was for the [facility] Nurse so I did not leave a message. Voicemail message was generic.

## 2019-03-19 NOTE — Telephone Encounter (Signed)
Spoke with [place], [place] is at the house now doing labs. She wants to know if [name] wants to wait until labs come back for the virtual visit to be done or if she wants to go ahead and move forward with the visit today. Either way is fine.   Please advise, thanks.

## 2019-03-22 ENCOUNTER — Other Ambulatory Visit: Payer: Self-pay | Admitting: Family Medicine

## 2019-03-22 DIAGNOSIS — Z5181 Encounter for therapeutic drug level monitoring: Secondary | ICD-10-CM | POA: Diagnosis not present

## 2019-03-23 ENCOUNTER — Telehealth: Payer: Self-pay | Admitting: Family Medicine

## 2019-03-23 ENCOUNTER — Inpatient Hospital Stay: Payer: PPO | Admitting: Orthopedic Surgery

## 2019-03-23 ENCOUNTER — Encounter: Payer: Self-pay | Admitting: Orthopedic Surgery

## 2019-03-23 ENCOUNTER — Other Ambulatory Visit: Payer: Self-pay

## 2019-03-23 ENCOUNTER — Ambulatory Visit (INDEPENDENT_AMBULATORY_CARE_PROVIDER_SITE_OTHER): Payer: PPO | Admitting: Orthopedic Surgery

## 2019-03-23 VITALS — Ht 66.0 in | Wt 166.0 lb

## 2019-03-23 DIAGNOSIS — I87319 Chronic venous hypertension (idiopathic) with ulcer of unspecified lower extremity: Secondary | ICD-10-CM

## 2019-03-23 DIAGNOSIS — I872 Venous insufficiency (chronic) (peripheral): Secondary | ICD-10-CM

## 2019-03-23 DIAGNOSIS — L97909 Non-pressure chronic ulcer of unspecified part of unspecified lower leg with unspecified severity: Secondary | ICD-10-CM

## 2019-03-23 DIAGNOSIS — I87312 Chronic venous hypertension (idiopathic) with ulcer of left lower extremity: Secondary | ICD-10-CM | POA: Diagnosis not present

## 2019-03-23 DIAGNOSIS — I890 Lymphedema, not elsewhere classified: Secondary | ICD-10-CM

## 2019-03-23 LAB — CBC WITH DIFFERENTIAL/PLATELET
Basophils Absolute: 0 10*3/uL (ref 0.0–0.2)
Basos: 1 %
EOS (ABSOLUTE): 0.2 10*3/uL (ref 0.0–0.4)
Eos: 3 %
Hematocrit: 25.9 % — ABNORMAL LOW (ref 34.0–46.6)
Hemoglobin: 8.1 g/dL — ABNORMAL LOW (ref 11.1–15.9)
Immature Grans (Abs): 0 10*3/uL (ref 0.0–0.1)
Immature Granulocytes: 0 %
Lymphocytes Absolute: 0.6 10*3/uL — ABNORMAL LOW (ref 0.7–3.1)
Lymphs: 13 %
MCH: 27.5 pg (ref 26.6–33.0)
MCHC: 31.3 g/dL — ABNORMAL LOW (ref 31.5–35.7)
MCV: 88 fL (ref 79–97)
Monocytes Absolute: 0.5 10*3/uL (ref 0.1–0.9)
Monocytes: 11 %
Neutrophils Absolute: 3.3 10*3/uL (ref 1.4–7.0)
Neutrophils: 72 %
Platelets: 226 10*3/uL (ref 150–450)
RBC: 2.95 x10E6/uL — ABNORMAL LOW (ref 3.77–5.28)
RDW: 19.9 % — ABNORMAL HIGH (ref 11.7–15.4)
WBC: 4.6 10*3/uL (ref 3.4–10.8)

## 2019-03-23 LAB — PROTIME-INR
INR: 1.4 — ABNORMAL HIGH (ref 0.9–1.2)
Prothrombin Time: 15.2 s — ABNORMAL HIGH (ref 9.1–12.0)

## 2019-03-23 NOTE — Telephone Encounter (Signed)
Noted.  We spoke this morning and she was unsure. Appt was scheduled 3 months ago.  I will let Debbie know.   Nothing further needed.

## 2019-03-23 NOTE — Progress Notes (Signed)
Office Visit Note   Patient: [name]           Date of Birth: 01-Dec-1933           MRN: [id number] Visit Date: 03/23/2019              Requested by: Elby Beck, [place],  [place] [address] PCP: Elby Beck, FNP  Chief Complaint  Patient presents with  . Left Leg - Follow-up    VSI compression wrap      HPI: The patient is an 85 year old woman who presents today in hospital follow-up for venous ulcerations to her left lower extremity as well as lymphedema she has been in compression wraps, 4 layers for the last week.  She is having quite excruciating pain to her left lower extremity predominantly to the ulcerative areas she is unable to elevate due to pain.  She has not been in compression garments prior to this hospitalization.  She was started on gabapentin however her daughter relates this may be making her too drowsy.  The patient lives independently.  [facility] to set up for therapy as well as nursing.  Assessment & Plan: Visit Diagnoses:  1. Chronic venous hypertension w ulceration (HCC)   2. Lymphedema   3. Venous insufficiency (chronic) (peripheral)     Plan: We will continue with the 4 layer compression Unna boot as well as Dynaflex applied to the left lower extremity today Dynaflex to the right.  Patient and daughter aware that they will need to purchase compression stockings for the right lower extremity at next visit we can treat incision to these.  We will call [facility] to set up twice weekly dressing changes I feel that we will be able to remove the wrap and let the patient days before applying new wrap.  Follow-Up Instructions: Return in about 2 weeks (around 04/06/2019).   Ortho Exam  Patient is alert, oriented, no adenopathy, well-dressed, normal affect, normal respiratory effort. On examination of the right lower extremity the patient does have some edema with brawny skin color changes. No erythema,  no open ulcers. No sign of infection. Left lower extremity with greater edema. Dimpled lymphedema. Posterior calf ulcer 5 cm in diameter and anterior ulceration 2 cm in diameter. Some surrounding maceration. No drainage or purulence. No erythema.  Imaging: No results found. No images are attached to the encounter.  Labs: Lab Results  Component Value Date   HGBA1C 6.1 (H) 08/17/2014   HGBA1C 5.6 01/14/2014   ESRSEDRATE 2 02/10/2012   REPTSTATUS 11/06/2018 FINAL 11/04/2018   GRAMSTAIN NO WBC SEEN NO ORGANISMS SEEN  02/21/2016   CULT (A) 11/04/2018    <10,000 COLONIES/mL INSIGNIFICANT GROWTH Performed at [facility], [place] [address] [place], [place] [address]    LABORGA ESCHERICHIA COLI 12/24/2016     Lab Results  Component Value Date   ALBUMIN 3.0 (L) 03/03/2019   ALBUMIN 3.0 (L) 03/02/2019   ALBUMIN 3.0 (L) 03/01/2019    Lab Results  Component Value Date   MG 2.1 03/10/2019   MG 2.1 03/08/2019   MG 2.4 03/05/2019   No results found for: VD25OH  No results found for: PREALBUMIN CBC EXTENDED Latest Ref Rng & Units 03/17/2019 03/16/2019 03/15/2019  WBC 4.0 - 10.5 K/uL 5.1 5.6 5.9  RBC 3.87 - 5.11 MIL/uL 2.93(L) 2.94(L) 3.21(L)  HGB 12.0 - 15.0 g/dL 7.7(L) 7.7(L) 8.4(L)  HCT 36.0 - 46.0 % 26.8(L)  26.8(L) 28.2(L)  PLT 150 - 400 K/uL 270 286 282  NEUTROABS 1.7 - 7.7 K/uL 3.4 3.7 -  LYMPHSABS 0.7 - 4.0 K/uL 0.8 0.9 -     Body mass index is 26.79 kg/m.  Orders:  No orders of the defined types were placed in this encounter.  No orders of the defined types were placed in this encounter.    Procedures: No procedures performed  Clinical Data: No additional findings.  ROS:  All other systems negative, except as noted in the HPI. Review of Systems  Constitutional: Negative for chills and fever.  Cardiovascular: Positive for leg swelling.  Skin: Positive for wound. Negative for color change.    Objective: Vital Signs: Ht 5\' 6"  (1.676 m)   Wt 166 lb  (75.3 kg)   BMI 26.79 kg/m   Specialty Comments:  No specialty comments available.  PMFS History: Patient Active Problem List   Diagnosis Date Noted  . Chronic venous hypertension w ulceration ([place])   . Melena   . AKI (acute kidney injury) ([place]) 03/03/2019  . Symptomatic anemia 02/25/2019  . Acute on chronic diastolic CHF (congestive heart failure), NYHA class 4 ([place]) 11/04/2018  . Acute blood loss anemia 11/04/2018  . Chronic bilateral pleural effusions 11/04/2018  . Shortness of breath 10/21/2018  . Displaced fracture of left femoral neck ([place]) 10/09/2018  . Somnolence   . Closed left hip fracture ([place]) 10/07/2018  . Fall   . Hypokalemia   . Afib ([place]) 06/01/2018  . Urinary frequency 09/18/2017  . Iliac artery stenosis, bilateral ([place]) 07/14/2017  . Recurrent UTI 07/14/2017  . Long term (current) use of anticoagulants 05/08/2017  . Anemia 11/18/2016  . Dysuria 11/18/2016  . Pulmonary hypertension ([place]) 12/04/2015  . B12 deficiency 05/22/2015  . PAD (peripheral artery disease) ([place])   . Elevated serum creatinine   . Renal cell carcinoma ([place])   . S/p nephrectomy   . TIA (transient ischemic attack) 08/16/2014  . Weakness 01/14/2014  . Chronic kidney disease (CKD), stage IV (severe) ([place]) 12/08/2013  . Preoperative cardiovascular examination 06/07/2013  . Chronic diastolic CHF (congestive heart failure) ([place]) 06/04/2013  . Mitral valve regurgitation 06/04/2013  . Allergic rhinitis 01/01/2013  . Chronic cholecystitis with calculus 09/15/2012  . Claudication ([place]) 01/21/2012  . Dizziness 11/26/2011  . Hypertension 05/20/2011  . Avascular necrosis of hip ([place]) 05/03/2011  . OTHER AND UNSPECIFIED COAGULATION DEFECTS 10/13/2009  . RECTAL BLEEDING 10/13/2009  . HOCM / IHSS 07/14/2009  . EDEMA 07/14/2009  . Hyperlipidemia 04/10/2009  . ADJUSTMENT DISORDER WITH ANXIETY 04/10/2009  . INCONTINENCE, URGE 04/10/2009  . Permanent atrial fibrillation 10/27/2008  . SINUS  BRADYCARDIA 10/27/2008  . Carotid artery stenosis 10/27/2008  . COPD (chronic obstructive pulmonary disease) ([place]) 10/27/2008  . ICD (implantable cardioverter-defibrillator) in place 10/27/2008   Past Medical History:  Diagnosis Date  . Adjustment disorder with anxiety   . Anginal pain ([place])   . Arthritis    "some in my hands" (11/11/2012)  . Atrial fibrillation ([place])   . Automatic implantable cardioverter-defibrillator in situ   . Blood in stool   . Carotid artery stenosis 09/2007   a. 09/2007: 60-79% bilateral (stable); b. 10/2008: 40-59% R 60-79%   . Chest pain, unspecified   . Chronic airway obstruction, not elsewhere classified   . Chronic bronchitis ([place])    "get it some; not q year" (11/11/2012)  . Chronic diastolic CHF (congestive heart failure) ([place]) 06/04/2013  . Chronic kidney disease, unspecified   .  Coronary artery disease    non-obstructive by 2006 cath  . Frequent UTI    "get them a couple times/yr" (11/11/2012)  . Hemorrhage of rectum and anus   . High cholesterol   . History of blood transfusion 04/2011   "after hip OR" (11/11/2012)  . Hypertension 05/20/2011  . Hypertr obst cardiomyop   . Hypotension, unspecified    cardiac cath 2006..nonobstructive CAD 30-40s lesions.Marland KitchenETT 1/09 nondiagnostic due to poor HR response..Right Renal Cancer 2003  . Long term (current) use of anticoagulants   . Malignant neoplasm of kidney, except pelvis   . Osteoarthritis of right hip   . Other and unspecified coagulation defects   . PONV (postoperative nausea and vomiting)   . Renal cancer ([place]) 06/2001   Right  . Secondary cardiomyopathy, unspecified   . Sinus bradycardia   . Urge incontinence     Family History  Problem Relation Age of Onset  . Heart failure Mother   . Breast cancer Maternal Aunt 82  . Breast cancer Cousin   . Breast cancer Other   . Colon cancer Neg Hx     Past Surgical History:  Procedure Laterality Date  . ABDOMINAL HYSTERECTOMY  1975   for benign causes   . APPENDECTOMY    . BI-VENTRICULAR PACEMAKER UPGRADE  05/04/2010  . BIOPSY  02/28/2019   Procedure: BIOPSY;  Surgeon: Milus Banister, MD;  Location[facility] END[facility]COPY;  Service: Endoscopy;;  . CARDIAC CATHETERIZATION  2006  . CARDIOVERSION N/A 02/20/2018   Procedure: CARDIOVERSION;  Surgeon: Minna Merritts, MD;  Location: [facility] ORS;  Service: Cardiovascular;  Laterality: N/A;  . CARDIOVERSION N/A 03/27/2018   Procedure: CARDIOVERSION;  Surgeon: Minna Merritts, MD;  Location: [facility] ORS;  Service: Cardiovascular;  Laterality: N/A;  . CATARACT EXTRACTION W/ INTRAOCULAR LENS  IMPLANT, BILATERAL  01/2006-02-2006  . CHOLECYSTECTOMY N/A 11/11/2012   Procedure: LAPAROSCOPIC CHOLECYSTECTOMY WITH INTRAOPERATIVE CHOLANGIOGRAM;  Surgeon: Imogene Burn. Georgette Dover, MD;  Location: [facility];  Service: General;  Laterality: N/A;  . COLONOSCOPY WITH PROPOFOL N/A 02/28/2019   Procedure: COLONOSCOPY WITH PROPOFOL;  Surgeon: Milus Banister, MD;  Location: [facility] ENDOSCOPY;  Service: Endoscopy;  Laterality: N/A;  . EP IMPLANTABLE DEVICE N/A 02/21/2016   Procedure: ICD Generator Changeout;  Surgeon: Deboraha Sprang, MD;  Location: [facility] CV LAB;  Service: Cardiovascular;  Laterality: N/A;  . ESOPHAGOGASTRODUODENOSCOPY (EGD) WITH PROPOFOL N/A 02/28/2019   Procedure: ESOPHAGOGASTRODUODENOSCOPY (EGD) WITH PROPOFOL;  Surgeon: Milus Banister, MD;  Location: [facility] ENDOSCOPY;  Service: Endoscopy;  Laterality: N/A;  . GIVENS CAPSULE STUDY N/A 03/15/2019   Procedure: GIVENS CAPSULE STUDY;  Surgeon: Thornton Park, MD;  Location: [facility];  Service: Gastroenterology;  Laterality: N/A;  . INSERT / REPLACE / REMOVE PACEMAKER  05-01-11   02-28-05-/05-04-10-ICD-MEDTRONIC MAXIMAL DR  . JOINT REPLACEMENT    . LAPAROSCOPIC CHOLECYSTECTOMY  11/11/2012  . LAPAROSCOPIC LYSIS OF ADHESIONS N/A 11/11/2012   Procedure: LAPAROSCOPIC LYSIS OF ADHESIONS;  Surgeon: Imogene Burn. Georgette Dover, MD;  Location: [facility];  Service: General;  Laterality: N/A;  .  NEPHRECTOMY Right 06/2001    S/P RENAL CELL CANCER  . PRESSURE SENSOR/CARDIOMEMS N/A 02/03/2019   Procedure: PRESSURE SENSOR/CARDIOMEMS;  Surgeon: Larey Dresser, MD;  Location: [facility] LAB;  Service: Cardiovascular;  Laterality: N/A;  . RIGHT HEART CATH N/A 11/09/2018   Procedure: RIGHT HEART CATH;  Surgeon: Larey Dresser, MD;  Location: [facility];  Service: Cardiovascular;  Laterality: N/A;  . RIGHT HEART CATH N/A  03/08/2019   Procedure: RIGHT HEART CATH;  Surgeon: Larey Dresser, MD;  Location: [facility] LAB;  Service: Cardiovascular;  Laterality: N/A;  . SUBMUCOSAL TATTOO INJECTION  02/28/2019   Procedure: SUBMUCOSAL TATTOO INJECTION;  Surgeon: Milus Banister, MD;  Location[facility]COPY;  Service: Endoscopy;;  . TOTAL HIP ARTHROPLASTY Right 05/03/2011   Procedure: TOTAL HIP ARTHROPLASTY ANTERIOR APPROACH;  Surgeon: Mcarthur Rossetti;  Location: [facility] ORS;  Service: Orthopedics;  Laterality: Right;  Removal of Cannulated Screws Right Hip, Right Direct Anterior Hip Replacement  . TOTAL HIP ARTHROPLASTY Left 10/09/2018   Procedure: TOTAL HIP ARTHROPLASTY ANTERIOR APPROACH;  Surgeon: Rod Can, MD;  Location: [facility];  Service: Orthopedics;  Laterality: Left;   Social History   Occupational History  . Occupation: Retired    Fish farm manager: RETIRED  Tobacco Use  . Smoking status: Former Smoker    Packs/day: 0.50    Years: 40.00    Pack years: 20.00    Types: Cigarettes    Quit date: 05/06/2001    Years since quitting: 17.8  . Smokeless tobacco: Never Used  Substance and Sexual Activity  . Alcohol use: No    Alcohol/week: 0.0 standard drinks  . Drug use: No  . Sexual activity: Never

## 2019-03-23 NOTE — Telephone Encounter (Signed)
[name] (pt granddaughter) called to cancel appointment for 11/18.  She wanted to let you know

## 2019-03-24 ENCOUNTER — Ambulatory Visit: Payer: PPO | Admitting: Family Medicine

## 2019-03-24 NOTE — Telephone Encounter (Signed)
We need to do a hospital follow up visit. It can be virtual.

## 2019-03-24 NOTE — Telephone Encounter (Signed)
Please call patient and schedule appointment as instructed. 

## 2019-03-25 NOTE — Telephone Encounter (Signed)
Virtual appointment 11/23 Christy grandaughter aware of appointment

## 2019-03-26 NOTE — Telephone Encounter (Signed)
Noted  

## 2019-03-28 DIAGNOSIS — Z905 Acquired absence of kidney: Secondary | ICD-10-CM | POA: Diagnosis not present

## 2019-03-28 DIAGNOSIS — I87312 Chronic venous hypertension (idiopathic) with ulcer of left lower extremity: Secondary | ICD-10-CM | POA: Diagnosis not present

## 2019-03-28 DIAGNOSIS — Z87891 Personal history of nicotine dependence: Secondary | ICD-10-CM | POA: Diagnosis not present

## 2019-03-28 DIAGNOSIS — I4821 Permanent atrial fibrillation: Secondary | ICD-10-CM | POA: Diagnosis not present

## 2019-03-28 DIAGNOSIS — Z5181 Encounter for therapeutic drug level monitoring: Secondary | ICD-10-CM

## 2019-03-28 DIAGNOSIS — N183 Chronic kidney disease, stage 3 unspecified: Secondary | ICD-10-CM | POA: Diagnosis not present

## 2019-03-28 DIAGNOSIS — Z7901 Long term (current) use of anticoagulants: Secondary | ICD-10-CM

## 2019-03-28 DIAGNOSIS — I5033 Acute on chronic diastolic (congestive) heart failure: Secondary | ICD-10-CM | POA: Diagnosis not present

## 2019-03-28 DIAGNOSIS — L97921 Non-pressure chronic ulcer of unspecified part of left lower leg limited to breakdown of skin: Secondary | ICD-10-CM | POA: Diagnosis not present

## 2019-03-28 DIAGNOSIS — I130 Hypertensive heart and chronic kidney disease with heart failure and stage 1 through stage 4 chronic kidney disease, or unspecified chronic kidney disease: Secondary | ICD-10-CM | POA: Diagnosis not present

## 2019-03-28 DIAGNOSIS — J449 Chronic obstructive pulmonary disease, unspecified: Secondary | ICD-10-CM | POA: Diagnosis not present

## 2019-03-28 DIAGNOSIS — D649 Anemia, unspecified: Secondary | ICD-10-CM | POA: Diagnosis not present

## 2019-03-28 DIAGNOSIS — Z95810 Presence of automatic (implantable) cardiac defibrillator: Secondary | ICD-10-CM | POA: Diagnosis not present

## 2019-03-28 DIAGNOSIS — M6281 Muscle weakness (generalized): Secondary | ICD-10-CM | POA: Diagnosis not present

## 2019-03-29 ENCOUNTER — Telehealth: Payer: Self-pay | Admitting: Family Medicine

## 2019-03-29 ENCOUNTER — Ambulatory Visit: Payer: PPO | Admitting: Family Medicine

## 2019-03-29 NOTE — Telephone Encounter (Signed)
Called and spoke with Verlee Rossetti, RN with [facility] [phone]) who will increase visits from 1 to 2 a week. Reviewed Dr. Jess Barters note with RN and that she is supposed to have una boot changes twice a week. She will discuss with LPN and also check with patient regarding daily weights. Unsure of accuracy of patient reported weights. If no improvement in SOB and fluid retention, will need to check with cardiology.

## 2019-03-29 NOTE — Telephone Encounter (Signed)
Pt was scheduled for an OV today for a HFU with Debbie.  Due to her granddaughter [name] being recently exposed to Covid she was not able to be there to help the patient with her visit today. The patient states that she feels so poorly today that she does not feel like doing the visit and just wants to reschedule. Pt c/o severe leg swelling, SOB and chest tightness. Pt states that she cannot walk but a short distance before becoming very SOB. Pt taking Torsemide 80mg  BID and Metolazone 5mg  today - Takes Metolazone twice a week when swellingis bad (on Tue and Thur). Pt states that she can feel that the medications are kicking in but she knows this can take several hours to really feel the difference. Pt instructed to go weigh - pt has not been weighing daily since being home bc she has been feeling so terrible - pt weight today is up 26lbs since d/c home from hospital. Pt was d/c at 161lb and is 187 today - weighed with clothes on.  Appt rescheduled to Thursday 11/25 at 11am for a Phone visit with Jackelyn Poling. Pt advised to call back today if her SOB is not improving by this afternoon. Pt also instructed per Jackelyn Poling to weigh herself daily and keep a log so that we can keep track of her weight gain and loss.   Will send to [facility] as FYI.

## 2019-03-31 ENCOUNTER — Encounter: Payer: Self-pay | Admitting: Family Medicine

## 2019-03-31 ENCOUNTER — Ambulatory Visit (INDEPENDENT_AMBULATORY_CARE_PROVIDER_SITE_OTHER): Payer: PPO | Admitting: Family Medicine

## 2019-03-31 ENCOUNTER — Encounter: Payer: Self-pay | Admitting: Family

## 2019-03-31 ENCOUNTER — Ambulatory Visit: Payer: PPO | Admitting: Vascular Surgery

## 2019-03-31 VITALS — Wt 178.0 lb

## 2019-03-31 DIAGNOSIS — D509 Iron deficiency anemia, unspecified: Secondary | ICD-10-CM

## 2019-03-31 DIAGNOSIS — I83029 Varicose veins of left lower extremity with ulcer of unspecified site: Secondary | ICD-10-CM

## 2019-03-31 DIAGNOSIS — L97919 Non-pressure chronic ulcer of unspecified part of right lower leg with unspecified severity: Secondary | ICD-10-CM

## 2019-03-31 DIAGNOSIS — I872 Venous insufficiency (chronic) (peripheral): Secondary | ICD-10-CM

## 2019-03-31 DIAGNOSIS — I4821 Permanent atrial fibrillation: Secondary | ICD-10-CM

## 2019-03-31 DIAGNOSIS — I83019 Varicose veins of right lower extremity with ulcer of unspecified site: Secondary | ICD-10-CM | POA: Diagnosis not present

## 2019-03-31 DIAGNOSIS — L97929 Non-pressure chronic ulcer of unspecified part of left lower leg with unspecified severity: Secondary | ICD-10-CM | POA: Diagnosis not present

## 2019-03-31 DIAGNOSIS — I5033 Acute on chronic diastolic (congestive) heart failure: Secondary | ICD-10-CM

## 2019-03-31 LAB — CUP PACEART INCLINIC DEVICE CHECK
Battery Remaining Longevity: 51 mo
Battery Voltage: 2.96 V
Brady Statistic AP VP Percent: 1.03 %
Brady Statistic AP VS Percent: 0.05 %
Brady Statistic AS VP Percent: 58.06 %
Brady Statistic AS VS Percent: 40.86 %
Brady Statistic RA Percent Paced: 0.69 %
Brady Statistic RV Percent Paced: 59.46 %
Date Time Interrogation Session: 20200825125600
HighPow Impedance: 36 Ohm
HighPow Impedance: 49 Ohm
Implantable Lead Implant Date: 20061026
Implantable Lead Implant Date: 20111230
Implantable Lead Location: 753859
Implantable Lead Location: 753860
Implantable Lead Model: 5076
Implantable Lead Model: 6947
Implantable Pulse Generator Implant Date: 20171018
Lead Channel Impedance Value: 361 Ohm
Lead Channel Impedance Value: 456 Ohm
Lead Channel Impedance Value: 456 Ohm
Lead Channel Pacing Threshold Amplitude: 0.75 V
Lead Channel Pacing Threshold Pulse Width: 0.4 ms
Lead Channel Sensing Intrinsic Amplitude: 0.25 mV
Lead Channel Sensing Intrinsic Amplitude: 8.75 mV
Lead Channel Setting Pacing Amplitude: 2.5 V
Lead Channel Setting Pacing Pulse Width: 0.4 ms
Lead Channel Setting Sensing Sensitivity: 0.3 mV

## 2019-03-31 MED ORDER — TRAMADOL HCL 50 MG PO TABS: 25.0000 mg | ORAL_TABLET | Freq: Three times a day (TID) | ORAL | 0 refills | Status: AC | PRN

## 2019-03-31 NOTE — Progress Notes (Signed)
Virtual Visit via Telephone Note  I connected with [name] on 03/31/19 at  3:30 PM EST by telephone and verified that I am speaking with the correct person using two identifiers.  Location: Patient: In her home Provider: [facility]   I discussed the limitations, risks, security and privacy concerns of performing an evaluation and management service by telephone and the availability of in person appointments. I also discussed with the patient that there may be a patient responsible charge related to this service. The patient expressed understanding and agreed to proceed.   History of Present Illness: This is an 85 year old female who presents today for telephone virtual visit to follow-up recent hospitalization.  She was hospitalized 10/21 through 03/17/2019 with CHF, worsening anemia, CKD stage III, fall, worsening shortness of breath and lower extremity edema.  During hospitalization, she was given 7 units of blood.  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  She is to follow-up with cardiology and GI on an outpatient basis.  She has been receiving home health services.  CHF-patient was severely fluid overloaded on admission requiring Lasix drip.  She had right heart cath 03/08/2019.  During hospitalization Lasix drip was stopped and she was started on torsemide.  She was discharged home with torsemide 80 mg twice a day and twice weekly metolazone as needed.  She was scheduled for a virtual visit 2 days ago and when she spoke with CMA, she was up quite a bit with her weight.  She took her metazalone and is down 10 pounds.  She did not recall that she was supposed to be doing daily weights.  I spoke with home health nurse earlier this week and encouraged close supervision of edema and daily weights.  At hospital discharge she was 166 pounds.  Today she is 178 which is 10 pounds down from earlier this week.  She reports improved dyspnea.  Venous  insufficiency/lower extremity edema-she has been followed by Dr. Sharol Given and is having twice weekly compression wraps applied.  She reports that this is incredibly painful and she requests pain medication.  She is very sensitive to narcotics but we discussed trying low-dose tramadol, alternating with acetaminophen.  Unable to take NSAIDs due to kidney disease and warfarin.  Palliative care-I discussed goals of care with patient and have discussed previously with her granddaughter [name].  Patient reports that she does not feel any better after her prolonged hospital stay and wonders why she even went.  I offered further discussion regarding her goals of therapy and whether or not she desires further hospitalization and treatment.  Discussed palliative care consult.  The patient did not want to make any decisions today and stated that she would think about it.   Observations/Objective: The patient is alert and answers questions appropriately.  She is normally conversive and does not sound short of breath on the phone.  Her mood and affect are appropriate. Wt 178 lb (80.7 kg)   BMI 28.73 kg/m   Assessment and Plan: 1. Chronic venous insufficiency of lower extremity -Continue with twice weekly home health dressing changes and follow-up with Dr. Sharol Given - traMADol (ULTRAM) 50 MG tablet; Take 0.5-1 tablets (25-50 mg total) by mouth every 8 (eight) hours as needed for up to 5 days for severe pain.  Dispense: 15 tablet; Refill: 0  2. Venous stasis ulcers of both lower extremities (HCC) - traMADol (ULTRAM) 50 MG tablet; Take 0.5-1 tablets (25-50 mg total) by mouth every 8 (  eight) hours as needed for up to 5 days for severe pain.  Dispense: 15 tablet; Refill: 0  3. Acute on chronic diastolic CHF (congestive heart failure), NYHA class 4 (HCC) -Discussed importance of daily weights with her and reminded her of as needed metolazone use.   4. Permanent atrial fibrillation ([place]) -She was restarted on her warfarin  and home health is drawing INR  5. Iron deficiency anemia, unspecified iron deficiency anemia type -Home health drawing CBC, continue to monitor   Clarene Reamer, FNP-BC  [facility] [place]  04/06/2019 9:02 AM   Follow Up Instructions:    I discussed the assessment and treatment plan with the patient. The patient was provided an opportunity to ask questions and all were answered. The patient agreed with the plan and demonstrated an understanding of the instructions.   The patient was advised to call back or seek an in-person evaluation if the symptoms worsen or if the condition fails to improve as anticipated.  I provided 21 minutes of non-face-to-face time during this encounter.   Elby Beck, FNP

## 2019-03-31 NOTE — Progress Notes (Signed)
Ria Comment with [facility] gave PT/INR results: INR 5.0 Requesting a call back with treatment plan.   Call back number [phone]  Varney Daily, [facility] 03/31/19

## 2019-04-02 ENCOUNTER — Other Ambulatory Visit: Payer: Self-pay | Admitting: Family Medicine

## 2019-04-04 ENCOUNTER — Other Ambulatory Visit (HOSPITAL_COMMUNITY)
Admission: RE | Admit: 2019-04-04 | Discharge: 2019-04-04 | Disposition: A | Discharge status: Home / Self Care | Payer: PPO | Source: Other Acute Inpatient Hospital | Attending: Family Medicine | Admitting: Family Medicine

## 2019-04-04 DIAGNOSIS — Z7901 Long term (current) use of anticoagulants: Secondary | ICD-10-CM | POA: Diagnosis not present

## 2019-04-04 DIAGNOSIS — D649 Anemia, unspecified: Secondary | ICD-10-CM | POA: Diagnosis not present

## 2019-04-04 LAB — CBC
HCT: 30.7 % — ABNORMAL LOW (ref 36.0–46.0)
Hemoglobin: 8.7 g/dL — ABNORMAL LOW (ref 12.0–15.0)
MCH: 25.7 pg — ABNORMAL LOW (ref 26.0–34.0)
MCHC: 28.3 g/dL — ABNORMAL LOW (ref 30.0–36.0)
MCV: 90.6 fL (ref 80.0–100.0)
Platelets: 245 10*3/uL (ref 150–400)
RBC: 3.39 MIL/uL — ABNORMAL LOW (ref 3.87–5.11)
RDW: 20.7 % — ABNORMAL HIGH (ref 11.5–15.5)
WBC: 4 10*3/uL (ref 4.0–10.5)
nRBC: 0 % (ref 0.0–0.2)

## 2019-04-05 ENCOUNTER — Telehealth: Payer: Self-pay

## 2019-04-05 NOTE — Telephone Encounter (Signed)
[facility] Night - Client TELEPHONE ADVICE RECORD [facility] Patient Name: [name] Gender: Female DOB: 1933-08-24 Age: 85 Y 24 D Return Phone Number: [phone] (Primary), [phone] (Secondary) [place] [address] Client [place] Night - Client Client Site [place] Physician Tor Netters- NP Contact Type Call Who Is Calling Patient / Member / Family / Caregiver Call Type Triage / Clinical Caller Name [name] Relationship To Patient Grandchild Return Phone Number [phone] (Primary) Chief Complaint FEVER - >= 104 or <97 Reason for Call Symptomatic / Request for [place] states that her grandmother is very weak and dizzy today. States that her temperature is 96.2 oral temp. (Has home health care due to long medical hx) Translation No Nurse Assessment Nurse: Thad Ranger, RN, Langley Gauss Date/Time (Eastern Time): 04/04/2019 12:06:44 PM Confirm and document reason for call. If symptomatic, describe symptoms. ---Caller states that her grandmother is very weak and dizzy today. States that her temperature is 96.2 oral temp. Has the patient had close contact with a person known or suspected to have the novel coronavirus illness OR traveled / lives in area with major community spread (including international travel) in the last 14 days from the onset of symptoms? * If Asymptomatic, screen for exposure and travel within the last 14 days. ---No Does the patient have any new or worsening symptoms? ---Yes Will a triage be completed? ---Yes Related visit to physician within the last 2 weeks? ---Yes Does the PT have any chronic conditions? (i.e. diabetes, asthma, this includes High risk factors for pregnancy, etc.) ---Yes List chronic conditions. ---Anemia w/recent transfusions, Kidney failure, CAD, PVD, Recent GI bleed Is  this a behavioral health or substance abuse call? ---No Guidelines Guideline Title Affirmed Question Affirmed Notes Nurse Date/Time (Eastern Time) Dizziness - Lightheadedness Difficulty breathing [name], RN, Langley Gauss 04/04/2019 12:11:21 PM PLEASE NOTE: All timestamps contained within this report are represented as [place] Standard Time. CONFIDENTIALTY NOTICE: This fax transmission is intended only for the addressee. It contains information that is legally privileged, confidential or otherwise protected from use or disclosure. If you are not the intended recipient, you are strictly prohibited from reviewing, disclosing, copying using or disseminating any of this information or taking any action in reliance on or regarding this information. If you have received this fax in error, please notify us immediately by telephone so that we can arrange for its return to [place]. Phone: [phone], Toll-Free: [phone], Fax: [phone] Page: 2 of 2 Call Id: [id number] [place]. Time Eilene Ghazi Time) Disposition Final User 04/04/2019 12:05:16 PM Send to Urgent [name] 04/04/2019 12:15:06 PM Go to ED Now Yes [name], RN, [name] Disagree/Comply Disagree Caller Understands Yes PreDisposition Call Doctor Care Advice Given Per Guideline GO TO ED NOW: NOTE TO TRIAGER - DRIVING: * Another adult should drive. * If immediate transportation is not available via car or taxi, then the patient should be instructed to call [id number]. BRING MEDICINES: * Please bring a list of your current medicines when you go to the Emergency Department (ER). CARE ADVICE given per Dizziness (Adult) guideline. Comments User: [name] Apple, RN Date/Time Eilene Ghazi Time): 04/04/2019 12:14:47 PM Caller states she wants an order for the [facility] to come to the home today to obtain a CBC. Advised to notify the [facility] of pt condition and the [facility] will need to call for the order. Referrals GO TO FACILITY REFUSED

## 2019-04-05 NOTE — Telephone Encounter (Signed)
[name] - Client TELEPHONE ADVICE RECORD AccessNurse Patient Name: [name] Gender: Female DOB: 22-Feb-1934 Age: 85 Y 25 D Return Phone Number: [phone] (Primary) Address: City/State/Zip: [place] [address] Client [name] - Client Client Site [place] Physician Tor Netters- NP Contact Type Call Who Is Calling Patient / Member / Family / Caregiver Call Type Triage / Clinical Caller Name [name] Relationship To Patient Grandparent Return Phone Number [phone] (Primary) Chief Complaint Unknown Complaint Reason for Call Symptomatic / Request for [place] says that home health came to take test on her grandmother today and wants to know if the hemoglobin results are in. Caller wants the on call to be paged. Translation No Nurse Assessment Nurse: Alveta Heimlich, RN, Rise Paganini Date/Time (Eastern Time): 04/04/2019 8:28:04 PM Confirm and document reason for call. If symptomatic, describe symptoms. ---Caller states that home health care drew cbc from her grandmother today at 3 pm and took it to the hospital for stat work. The home health nurse said they would call the dr. today with results. Grandmother is having the lightheadedness and dizziness she was having off and on when she called earlier. Really tired and weak. Has the patient had close contact with a person known or suspected to have the novel coronavirus illness OR traveled / lives in area with major community spread (including international travel) in the last 14 days from the onset of symptoms? * If Asymptomatic, screen for exposure and travel within the last 14 days. ---No Does the patient have any new or worsening symptoms? ---Yes Will a triage be completed? ---Yes Related visit to physician within the last 2 weeks? ---No Does the PT have any chronic conditions? (i.e.  diabetes, asthma, this includes High risk factors for pregnancy, etc.) ---Yes List chronic conditions. ---Anemia, recent transfusion, kidney failure, CAD, PVD, recent GI Bleed, out of hospital 2 and 1/2 weeks ago. Is this a behavioral health or substance abuse call? ---No PLEASE NOTE: All timestamps contained within this report are represented as [place] Standard Time. CONFIDENTIALTY NOTICE: This fax transmission is intended only for the addressee. It contains information that is legally privileged, confidential or otherwise protected from use or disclosure. If you are not the intended recipient, you are strictly prohibited from reviewing, disclosing, copying using or disseminating any of this information or taking any action in reliance on or regarding this information. If you have received this fax in error, please notify us immediately by telephone so that we can arrange for its return to [place]. Phone: [phone], Toll-Free: [phone], Fax: [phone] Page: 2 of 3 Call Id: [id number] Guidelines Guideline Title Affirmed Question Affirmed Notes Nurse Date/Time Eilene Ghazi Time) Dizziness - Lightheadedness Patient sounds very sick or weak to the triager Alveta Heimlich, RN, [facility] 04/04/2019 8:35:42 PM Disp. Time Eilene Ghazi Time) Disposition Final User 04/04/2019 8:45:10 PM Paged On Call back to [facility] Alveta Heimlich, [place], Rise Paganini 04/04/2019 8:39:52 PM Go to ED Now (or PCP triage) Yes Alveta Heimlich, RN, Ali Lowe Disagree/Comply Disagree Caller Understands Yes PreDisposition Call Doctor Care Advice Given Per Guideline GO TO ED NOW (OR PCP TRIAGE): ANOTHER ADULT SHOULD DRIVE: CARE ADVICE given per Dizziness (Adult) guideline. Comments User: Debby Bud, RN Date/Time Eilene Ghazi Time): 04/04/2019 8:44:31 PM She says the doctor told her to keep track of hgb and go in if she has to. Needs to have the doctor paged to find out if the lab if back and if she  needs to take her in.Does not take her Grandma in to  sit in waiting room for hours. User: Debby Bud, RN Date/Time Eilene Ghazi Time): 04/04/2019 8:56:33 PM Told grand daughter that on call physician has not heard any thing about the lab work. She does recommend since she is dizzy and lightheaded with recent gi bleed and transfusions, she should take her in now to ER to be evaluated. Grand daughter says she does not know if her grandmom will agree to go but she will try. Referrals GO TO FACILITY REFUSED Paging DoctorName Phone DateTime Result/Outcome Message Type Notes Waunita Schooner- MD [phone] 04/04/2019 8:45:10 PM Paged On Call Back to Call Center Doctor Paged Waunita Schooner- MD 04/04/2019 8:52:26 PM Spoke with On Call - General Message Result Report given. Dr Einar Pheasant had given order to home health to draw the lab. but has not heard anything about results. She said was not ordered through her system, but through home health's system. She would think if the results were critical that she would have been called, but has not been notified of any lab results. She does recommend that since pt is feeling dizzy and lightheaded and has had recent GI bleed and transfusions, that her grand daughter PLEASE NOTE: All timestamps contained within this report are represented as [place] Standard Time. CONFIDENTIALTY NOTICE: This fax transmission is intended only for the addressee. It contains information that is legally privileged, confidential or otherwise protected from use or disclosure. If you are not the intended recipient, you are strictly prohibited from reviewing, disclosing, copying using or disseminating any of this information or taking any action in reliance on or regarding this information. If you have received this fax in error, please notify us immediately by telephone so that we can arrange for its return to [place]. Phone: [phone], Toll-Free: [phone], Fax: [phone] Page: 3 of 3 Call Id: [id number] Paging DoctorName  Phone DateTime Result/Outcome Message Type Notes should take her to ER for evaluation now.

## 2019-04-05 NOTE — Telephone Encounter (Signed)
I spoke with [name];(DPR signed) [facility] nurse came out and drew blood to ck hgb. [name] has not spoken with pt so far today. I spoke with pt;Pt has been very nauseated and dizzy. Pt said [facility] drew blood over weekend; pt has had blood in urine.slight Vaginal discharge pale yellow and vaginal and perineal itching.pt asked me to call [name] her granddaughter and talk with her. I spoke with [name] at pts request; has not heard from blood test. [name] does not think [place] urines. [name] request cb with lab results and who should do urine specimen HH or [facility]. Per lab result tab hgb 8.7 and hct 30.7 ([name] was not given these lab results).

## 2019-04-05 NOTE — Telephone Encounter (Signed)
[place] Night - Client Nonclinical Telephone Record AccessNurse Client [place] Night - Client Client Site [place] Physician Tor Netters- NP Contact Type Call Who Is Calling Patient / Member / Family / Caregiver Caller Name Sharon Phone Number [phone] Patient Name [name] Patient DOB 1933/12/22 Call Type Message Only Information Provided Reason for Call Request to Speak to a Physician Initial Comment Caller states that she is calling for her grandmother. She wants to make sure that her blood sample was taken to [facility] and if the results are back regarding her Hemoglobin. Declined triage. Additional Comment Disp. Time Disposition Final User 04/04/2019 6:42:42 PM General Information Provided Yes Windy Canny Call Closed By: Windy Canny Transaction Date/Time: 04/04/2019 6:39:51 PM (ET)

## 2019-04-05 NOTE — Telephone Encounter (Signed)
Patient's grandaughter has been contacted with Debbie's comments and she verbalized understanding.

## 2019-04-05 NOTE — Telephone Encounter (Signed)
Please call patient's granddaughter and let her know that blood work looked great! Improved. I got in touch with home health nurse who will collect a urine tomorrow.

## 2019-04-05 NOTE — Telephone Encounter (Signed)
[place] Night - Client Nonclinical Telephone Record AccessNurse Client [place] Night - Client Client Site [place] - Night Physician Tor Netters- NP Contact Type Call Call [place] Page Now Who Is [place] / [place] Name [place] Name [place] Number [phone] Patient Name [name] Patient DOB June 26, 1933 Reason for Call Request to speak to Physician Initial Comment Caller states she got a call from a triage nurse that the doctor wanted to do lab work for a mutual patient but she needs an order form the doctor. Additional Comment Disp. Time Disposition Final User 04/04/2019 1:02:38 PM Send to [place], [place] 04/04/2019 1:30:35 PM Called On-Call Provider Nyoka Cowden, Amy 04/04/2019 1:32:57 PM Page Completed Sandrea Hammond, Amy Paging DoctorName Phone DateTime Result/Outcome Message Type Notes Waunita Schooner- MD [phone] 04/04/2019 1:30:35 PM Called On Call Provider - Reached Doctor Paged Waunita Schooner- MD 04/04/2019 1:30:40 PM Spoke with On Call - General Message Result Call Closed By: Philis Kendall Transaction Date/Time: 04/04/2019 1:00:09 PM (ET)

## 2019-04-06 ENCOUNTER — Telehealth: Payer: Self-pay | Admitting: *Deleted

## 2019-04-06 ENCOUNTER — Encounter: Payer: Self-pay | Admitting: Family Medicine

## 2019-04-06 DIAGNOSIS — I5033 Acute on chronic diastolic (congestive) heart failure: Secondary | ICD-10-CM | POA: Diagnosis not present

## 2019-04-06 DIAGNOSIS — M6281 Muscle weakness (generalized): Secondary | ICD-10-CM | POA: Diagnosis not present

## 2019-04-06 DIAGNOSIS — I87312 Chronic venous hypertension (idiopathic) with ulcer of left lower extremity: Secondary | ICD-10-CM | POA: Diagnosis not present

## 2019-04-06 DIAGNOSIS — L97921 Non-pressure chronic ulcer of unspecified part of left lower leg limited to breakdown of skin: Secondary | ICD-10-CM | POA: Diagnosis not present

## 2019-04-06 DIAGNOSIS — J449 Chronic obstructive pulmonary disease, unspecified: Secondary | ICD-10-CM | POA: Diagnosis not present

## 2019-04-06 DIAGNOSIS — I4821 Permanent atrial fibrillation: Secondary | ICD-10-CM | POA: Diagnosis not present

## 2019-04-06 DIAGNOSIS — N183 Chronic kidney disease, stage 3 unspecified: Secondary | ICD-10-CM | POA: Diagnosis not present

## 2019-04-06 DIAGNOSIS — Z905 Acquired absence of kidney: Secondary | ICD-10-CM | POA: Diagnosis not present

## 2019-04-06 DIAGNOSIS — I130 Hypertensive heart and chronic kidney disease with heart failure and stage 1 through stage 4 chronic kidney disease, or unspecified chronic kidney disease: Secondary | ICD-10-CM | POA: Diagnosis not present

## 2019-04-06 DIAGNOSIS — D649 Anemia, unspecified: Secondary | ICD-10-CM | POA: Diagnosis not present

## 2019-04-06 NOTE — Telephone Encounter (Signed)
Please call patient and tell her to continue 2 mg warfarin nightly and recheck in 1 week. Please give verbal order to home health to recheck INR in 1 week.

## 2019-04-06 NOTE — Telephone Encounter (Signed)
[place] with [facility] called stating that patient's  INR today is 2.2. Dorian Heckle also stated that she collected a urine and it was taken to [facility].

## 2019-04-07 ENCOUNTER — Ambulatory Visit: Payer: PPO | Admitting: Gastroenterology

## 2019-04-07 NOTE — Telephone Encounter (Signed)
Left another message on voicemail for [facility] nurse to call back. Need to give her order per Tor Netters NP.

## 2019-04-07 NOTE — Telephone Encounter (Signed)
Patient notified as instructed by telephone and verbalized understanding.  Left message on voicemail for home health nurse to call back.

## 2019-04-08 ENCOUNTER — Telehealth: Payer: Self-pay | Admitting: Family Medicine

## 2019-04-08 NOTE — Telephone Encounter (Signed)
Patient called to find out if Jackelyn Poling has received her urine culture results from [facility].

## 2019-04-08 NOTE — Telephone Encounter (Signed)
[place] nurse with [facility] returned call and notified as instructed and Chakirra voiced understanding.

## 2019-04-09 ENCOUNTER — Ambulatory Visit: Payer: PPO | Admitting: Physician Assistant

## 2019-04-09 NOTE — Telephone Encounter (Signed)
Please call patient and explain that it would be helpful to know where discomfort is coming from and if she has any perineal skin irritation, breakdown or bleeding. I am concerned that all of her swelling is putting her at increased risk of skin breakdown including in her sacral/perineal area. Is she comfortable having anyone look at it?

## 2019-04-09 NOTE — Telephone Encounter (Signed)
  Spoke with Dorian Heckle, nurse with Home health at [phone] and asked for a nurse to look and see if there is bleeding that maybe coming from vaginal or perianal area. She will let the nurse know that is due to come see patient today.  I called patient to let her know that the nurse will look to try and see if there is any issues in the vaginal or perianal area. Patient declines this, she does not want them to look. I called Chakilrra back and advised her of that. I tried to explain to patient the reason for the nurse to need to look but she continues to decline this. She does have some vaginal discharge, itching and burning and her lower back all the way across is sore/hurting. No urinary discomfort.

## 2019-04-09 NOTE — Telephone Encounter (Signed)
Patient advised. Patient states that symptoms are persistent: blood spots in her underwear-small amount, and discomfort/pain her lower back. NO fever, no burning with urination, no vomiting. What to do at this time?

## 2019-04-09 NOTE — Telephone Encounter (Signed)
Please call patient and let her know that her urine did not show infection. There was no significant bacterial growth. Please follow up if symptoms persist.

## 2019-04-09 NOTE — Telephone Encounter (Signed)
See if patient thinks blood could be coming from her vagina or perineal area? Can we have home health nurse take a look? Any vaginal discharge, itching, burning?

## 2019-04-14 NOTE — Telephone Encounter (Addendum)
I spoke with patient to follow up from Anastasiya's conversation, pt states that [facility] is keeping an eye on her pressure sores on her legs and are treating those appropriately - no new sores. Pt states that she has not noticed any sores anywhere else on her body.   Pt states that she is still noticing blood in her urine; small amount when wiping. Pt states that it is not a lot. Pt states that she does not have any discomfort or noticed any breakdown of skin in her vaginal/perineal region. Pt states that she has not noticed any irritation and feels that the blood is coming from her vagina - possibly from her urinary issues last week? No pain, burning.  Pt states that [place] is keeping a close eye on things.   Pt asking if she needs to take Metolazone 5mg  tablet. Per ED the patient was advised to take this twice a week (Tue/Thur) when swelling is really bad in addition to her Torsemide 80mg  BID-- per recent telephone encounter with Patrice Paradise stated that if the patient's SOB and fluid retention did not improve or worsened she needed to get in touch with Cardiology for further instruction. P states that she has not called them yet - I advised the patient to reach out to Cards for recommendations regarding her medications as Jackelyn Poling is not in the office this week. Pt advised that if they are not able to give her any medication advise to contact our office again and I would discuss with another Provider here.   Will send to [facility] as FYI.

## 2019-04-15 ENCOUNTER — Ambulatory Visit (INDEPENDENT_AMBULATORY_CARE_PROVIDER_SITE_OTHER): Payer: PPO | Admitting: General Practice

## 2019-04-15 DIAGNOSIS — Z01810 Encounter for preprocedural cardiovascular examination: Secondary | ICD-10-CM

## 2019-04-15 DIAGNOSIS — Z7901 Long term (current) use of anticoagulants: Secondary | ICD-10-CM | POA: Diagnosis not present

## 2019-04-15 LAB — POCT INR: INR: 2.4 (ref 2.0–3.0)

## 2019-04-15 NOTE — Telephone Encounter (Signed)
Noted  

## 2019-04-15 NOTE — Patient Instructions (Signed)
Pre visit review using our clinic review tool, if applicable. No additional management support is needed unless otherwise documented below in the visit note.  Continue prior dose of 2 mg daily except 3 mg on Wednesdays. Recheck in 3 to 4 weeks. Dosing instructions given to Olivia Mackie, RN @ [facility] while in patient's home.

## 2019-04-16 ENCOUNTER — Other Ambulatory Visit: Payer: Self-pay | Admitting: Internal Medicine

## 2019-04-19 ENCOUNTER — Encounter (HOSPITAL_COMMUNITY): Payer: PPO | Admitting: Cardiology

## 2019-04-22 ENCOUNTER — Telehealth: Payer: Self-pay | Admitting: Family Medicine

## 2019-04-22 NOTE — Telephone Encounter (Signed)
Received a call from Vella Kohler  ([facility] Nurse) [phone]  Patient's weight today - 164.5lb [facility] will be discharging her next week d/t insurance. They will check INR one more time before d/c.  Patient will have to figure out how to get it checked in the future   Will send to [place] as FYI

## 2019-04-23 NOTE — Telephone Encounter (Signed)
Pt states that she is still feeling very weak and does not feel well at all. Still having SOB, weight today was 165lb.   Pt states that her leg is healing - ulcers - pt is concerned that if they d/c their visits that her ulcers are going to come back. Pt is wanting recommendations on what to do to help keep legs healthy -- any other services to help? HH said that they could show a family member how to do her wraps on her legs but she is concerned of this because they would not have the proper training. Pt is going to try and appeal with insurance and see if she can get additional visits.   Pt is agreeable to INR checks here at the office. Pt will call us to set up her next INR after her last [facility] INR check.

## 2019-04-23 NOTE — Telephone Encounter (Signed)
Please call patient and see how she is doing.  If home health services have been discontinued then she will need to come to the office for her INR check.  If she is unable to get out of the car, Leafy Ro said they can come to her vehicle.  See if she thinks she would be able to do this.  She would call and schedule as normal.

## 2019-04-23 NOTE — Telephone Encounter (Signed)
Please call patient and let her know that I am sorry she is having persistent weakness and feeling bad. When is she following up with Dr. Sharol Given? I think it will be helpful for her family members to learn how to wrap her legs if home health will not be coming out any more. I noticed that she missed two follow up appointments- with gi and oncology for her anemia. Is she planning to reschedule these?

## 2019-04-24 NOTE — Telephone Encounter (Signed)
ATC patient, rang several times with no answer

## 2019-04-26 ENCOUNTER — Telehealth: Payer: Self-pay | Admitting: Family Medicine

## 2019-04-26 NOTE — Telephone Encounter (Signed)
Patient's daughter,Susan,called.  Patient's feeling extremely weak and [name] wanted to know if patient's hemoglobin can be checked by the home health nurse tomorrow.

## 2019-04-27 ENCOUNTER — Telehealth: Payer: Self-pay | Admitting: Family Medicine

## 2019-04-27 ENCOUNTER — Other Ambulatory Visit: Payer: Self-pay | Admitting: Family Medicine

## 2019-04-27 ENCOUNTER — Other Ambulatory Visit: Payer: Self-pay | Admitting: *Deleted

## 2019-04-27 DIAGNOSIS — Z7901 Long term (current) use of anticoagulants: Secondary | ICD-10-CM

## 2019-04-27 NOTE — Telephone Encounter (Signed)
[place].   INR 2.7,PT 29.6. Weight today was 169 lbs. Patient's insurance approved more visits, so patient won't be discharged today.

## 2019-04-27 NOTE — Telephone Encounter (Signed)
This great to hear! Will send to [place] as [employer] with Lurline Idol, states that patient looks really good. She is having some swelling but she also forgot to elevate her legs last night. Lurline Idol states that her left leg is much improved and overall both legs look really good.   I requested that a Hgb level be drawn next week -- Lurline Idol put the patient down to have this done on Monday. If she is able to get in here this week to have it done at our office then they will cancel their order.  They also have an INR check scheduled for Jan 7th.

## 2019-04-27 NOTE — Telephone Encounter (Signed)
I spoke with Jackelyn Poling and she states that [facility] will likely not draw this as they have d/c'd services with the patient as of yesterday. Pt will have to come by the office to have a lab draw -- of too weak then we can do car side draw.  [name] states that she is going to talk to the patient and will let us know what they decide.  The patient was in the process of getting her [facility] services extended last week and then her sister passed away unexpectedly so she has not received any further information on this. They are going to work on this now.   Per Jackelyn Poling, draw a CBC w/diff  INR not due until Jan 7th.   Orders will be placed for lab draw if the patient decides to come in office.  Will await call back

## 2019-04-27 NOTE — Telephone Encounter (Signed)
Spoke with patient, aware that we can do a Hgb draw today to check her levels if she wants her daughter or someone to bring her. Pt states that she does not feel that she can come today because she is so weak. Pt states that she will schedule this to done next week.  I advised that with her already being as fatigued as she is we may need to check this this week. SI advised that if her symptoms worsen then she needs to really consider getting this done this week - we do not want her levels to get too low because at that point it will then take a while to get them back to normal range. Pt states that she will watch her symptoms especially tonight and will let us know if she wants to go ahead and get this done this week. I informed her that she will have to call and set up a lab visit ONLY and this can be done car side since she is so weak.   Order placed for CBC w/diff.    States that they are still working on getting her [place] extended.   Pt has a lot of fluid rentention, weight today is 169lb, increased SOB and fatigue, leg weakness - worse around ankles. Swelling comes and goes.  Pt took a Metolazone 5mg  today in addition to Torsemide 80mg  BID.   Will send to [facility] as FYI.

## 2019-04-27 NOTE — Telephone Encounter (Signed)
Spoke with patient about her legs and having [place] teach her family how to do this. She states that they will only show them one time how to do it and she does not feel that that is adequate teaching. She was advised by her Cardiologist to make sure that someone well trained is wrapping her legs because if wrapped too loose or too tight this will create more issues with her. She states that she is going to speak with them about this when they come out.

## 2019-04-27 NOTE — Telephone Encounter (Signed)
Patient is returning your call.  

## 2019-04-27 NOTE — Telephone Encounter (Signed)
Spoke with patient daughter [name], requesting orders for [place] to check her Hemoglobin levels. Pt is feeling "low" again.   Pt went to a funeral over the weekend, her sister passed away unexpectedly last week. Daughter feels that she possibly overdid herself this weekend and then with stress over the last week of dealing with the death of her sister.   HH is coming out to the house this morning.   Please advise, thanks.

## 2019-04-27 NOTE — Telephone Encounter (Signed)
Spoke with the patient's daughter [name], she states that [name]'s sister passed away unexpectedly last week. Patient has been dealing with the increased stress over the last week of dealing with her death and did miss appts. I just advised that they make sure to reschedule these and stay up to date with visits so that she will remain well.   Will send to [facility] as FYI.

## 2019-04-27 NOTE — Telephone Encounter (Signed)
ATC, line busy, will call back

## 2019-04-28 NOTE — Telephone Encounter (Signed)
Noted  

## 2019-05-05 ENCOUNTER — Telehealth: Payer: Self-pay | Admitting: Family Medicine

## 2019-05-05 NOTE — Telephone Encounter (Signed)
Patient is requesting a call back She stated that when the nurse came out to her house they did not check the Hgb level. Because the nurse did not get the message until after she was at the home

## 2019-05-05 NOTE — Telephone Encounter (Signed)
error 

## 2019-05-06 ENCOUNTER — Other Ambulatory Visit (INDEPENDENT_AMBULATORY_CARE_PROVIDER_SITE_OTHER): Payer: PPO

## 2019-05-06 ENCOUNTER — Other Ambulatory Visit: Payer: Self-pay | Admitting: Family Medicine

## 2019-05-06 DIAGNOSIS — R0602 Shortness of breath: Secondary | ICD-10-CM

## 2019-05-06 DIAGNOSIS — I5032 Chronic diastolic (congestive) heart failure: Secondary | ICD-10-CM

## 2019-05-06 LAB — COMPREHENSIVE METABOLIC PANEL
ALT: 10 U/L (ref 0–35)
AST: 17 U/L (ref 0–37)
Albumin: 3.7 g/dL (ref 3.5–5.2)
Alkaline Phosphatase: 64 U/L (ref 39–117)
BUN: 32 mg/dL — ABNORMAL HIGH (ref 6–23)
CO2: 32 mEq/L (ref 19–32)
Calcium: 9.2 mg/dL (ref 8.4–10.5)
Chloride: 97 mEq/L (ref 96–112)
Creatinine, Ser: 1.24 mg/dL — ABNORMAL HIGH (ref 0.40–1.20)
GFR: 41.3 mL/min — ABNORMAL LOW (ref >60.00)
Glucose, Bld: 117 mg/dL — ABNORMAL HIGH (ref 70–99)
Potassium: 4.4 mEq/L (ref 3.5–5.1)
Sodium: 134 mEq/L — ABNORMAL LOW (ref 135–145)
Total Bilirubin: 0.9 mg/dL (ref 0.2–1.2)
Total Protein: 7.4 g/dL (ref 6.0–8.3)

## 2019-05-06 LAB — CBC WITH DIFFERENTIAL/PLATELET
Basophils Absolute: 0 10*3/uL (ref 0.0–0.1)
Basophils Relative: 1 % (ref 0.0–3.0)
Eosinophils Absolute: 0 10*3/uL (ref 0.0–0.7)
Eosinophils Relative: 0.3 % (ref 0.0–5.0)
HCT: 27.5 % — ABNORMAL LOW (ref 36.0–46.0)
Hemoglobin: 8.2 g/dL — ABNORMAL LOW (ref 12.0–15.0)
Lymphocytes Relative: 12.8 % (ref 12.0–46.0)
Lymphs Abs: 0.5 10*3/uL — ABNORMAL LOW (ref 0.7–4.0)
MCHC: 29.9 g/dL — ABNORMAL LOW (ref 30.0–36.0)
MCV: 73.7 fl — ABNORMAL LOW (ref 78.0–100.0)
Monocytes Absolute: 0.6 10*3/uL (ref 0.1–1.0)
Monocytes Relative: 16.8 % — ABNORMAL HIGH (ref 3.0–12.0)
Neutro Abs: 2.5 10*3/uL (ref 1.4–7.7)
Neutrophils Relative %: 69.1 % (ref 43.0–77.0)
Platelets: 270 10*3/uL (ref 150.0–400.0)
RBC: 3.74 Mil/uL — ABNORMAL LOW (ref 3.87–5.11)
RDW: 22.1 % — ABNORMAL HIGH (ref 11.5–15.5)
WBC: 3.6 10*3/uL — ABNORMAL LOW (ref 4.0–10.5)

## 2019-05-06 LAB — BRAIN NATRIURETIC PEPTIDE: Pro B Natriuretic peptide (BNP): 431 pg/mL — ABNORMAL HIGH (ref 0.0–100.0)

## 2019-05-06 NOTE — Telephone Encounter (Signed)
Spoke with patient - pt states that she is feeling terrible. Very SOB today, retaining a lot of fluid.  Weight while on the phone 170.8 lb Leg swelling is very increased  Pt is very winded on the phone, hard to carry out a conversation.  Pt has no one to bring her today to get her blood work done.   Pt states that when [facility] came out Monday they did not draw a Hgb level - it was missed in their orders. Magdalene is not sure if HH is coming back out before the weekend and they would need STAT orders and know someone to reach out to given its a long weekend. They will likely not be able to draw this until next week unless we call and request special labs be done today.    Pt has an appt at 1230 today with another Provider, not sure if able to come by for lab draw before.   Advised that I would contact [place] and call her back once I know more about what we need to do.

## 2019-05-06 NOTE — Telephone Encounter (Signed)
Patient will be coming by for stat labs around 11:30, unable to get out of her vehicle. Please draw her curbside.   Called and spoke to patient. She has not taken metolazone since last week. Was out of med but will have delivered today. Weight at hospital discharge was 161 so she is definitely up. She thinks she can come by for stat labs at 11:30 today. Orders entered. She will take metolazone today prior to torsemide. I will notify her of lab results and she will continue to check her weights. She sounded mildly SOB with talking and reports that she is able to do very little before having to rest.

## 2019-05-07 ENCOUNTER — Telehealth: Payer: Self-pay | Admitting: Family Medicine

## 2019-05-07 NOTE — Telephone Encounter (Signed)
See result note for telephone call documentation.

## 2019-05-13 ENCOUNTER — Telehealth: Payer: Self-pay

## 2019-05-13 DIAGNOSIS — D649 Anemia, unspecified: Secondary | ICD-10-CM | POA: Diagnosis not present

## 2019-05-13 DIAGNOSIS — N183 Chronic kidney disease, stage 3 unspecified: Secondary | ICD-10-CM | POA: Diagnosis not present

## 2019-05-13 DIAGNOSIS — Z95810 Presence of automatic (implantable) cardiac defibrillator: Secondary | ICD-10-CM | POA: Diagnosis not present

## 2019-05-13 DIAGNOSIS — M6281 Muscle weakness (generalized): Secondary | ICD-10-CM | POA: Diagnosis not present

## 2019-05-13 DIAGNOSIS — I4821 Permanent atrial fibrillation: Secondary | ICD-10-CM | POA: Diagnosis not present

## 2019-05-13 DIAGNOSIS — R2689 Other abnormalities of gait and mobility: Secondary | ICD-10-CM | POA: Diagnosis not present

## 2019-05-13 DIAGNOSIS — I130 Hypertensive heart and chronic kidney disease with heart failure and stage 1 through stage 4 chronic kidney disease, or unspecified chronic kidney disease: Secondary | ICD-10-CM | POA: Diagnosis not present

## 2019-05-13 DIAGNOSIS — Z7689 Persons encountering health services in other specified circumstances: Secondary | ICD-10-CM | POA: Diagnosis not present

## 2019-05-13 DIAGNOSIS — Z905 Acquired absence of kidney: Secondary | ICD-10-CM | POA: Diagnosis not present

## 2019-05-13 DIAGNOSIS — I5033 Acute on chronic diastolic (congestive) heart failure: Secondary | ICD-10-CM | POA: Diagnosis not present

## 2019-05-13 DIAGNOSIS — J449 Chronic obstructive pulmonary disease, unspecified: Secondary | ICD-10-CM | POA: Diagnosis not present

## 2019-05-13 LAB — POCT INR: INR: 1.6 — AB (ref 2.0–3.0)

## 2019-05-13 NOTE — Telephone Encounter (Signed)
This should await Tor Netters who is in office tommorow I believe. Nonurgent

## 2019-05-13 NOTE — Telephone Encounter (Signed)
Lilia Pro nurse with [facility] left v/m for recommendations regarding edema to legs; pt does not have active wounds at this time. Lilia Pro wants to know if needs to continue or change orders from 3 layer compression wrap with una boot to right leg and 4 layer compression wrap with una boot to left leg. Lilia Pro request cb.  Will send note to Glenda Chroman FNP who is out of office today and Dr Diona Browner.

## 2019-05-14 ENCOUNTER — Other Ambulatory Visit: Payer: Self-pay | Admitting: Family Medicine

## 2019-05-14 DIAGNOSIS — R0981 Nasal congestion: Secondary | ICD-10-CM

## 2019-05-14 DIAGNOSIS — H6992 Unspecified Eustachian tube disorder, left ear: Secondary | ICD-10-CM

## 2019-05-14 NOTE — Telephone Encounter (Signed)
Addendum - this message can be closed. I left a detailed message with recommendations, advised if any questions to call back

## 2019-05-14 NOTE — Telephone Encounter (Signed)
Please call Lilia Pro with [facility] and advise her to check with wound care who was treating patients leg swelling. Given her recent skin breakdown, I would prefer recommendations to come from them.

## 2019-05-14 NOTE — Telephone Encounter (Signed)
Left detailed message for Lilia Pro to return my call regarding patient's legs/wound care

## 2019-05-19 ENCOUNTER — Telehealth: Payer: Self-pay

## 2019-05-19 NOTE — Telephone Encounter (Signed)
INR reminder reports pt is overdue for INR. Last INR was 04/15/19.  Contacted pt and she reports she had a home health nurse, Lilia Pro, that came in last week and drew labs. Advised no labs are in the chart. Pt gave number to [facility] to get [name]'s number. Number is [phone] for [facility]. [facility] gave number for [facility] as [phone]. LVM for [employer]. Need to know if labs were drawn and if so was there an INR. If no INR was done then pt is due and please perform test when she is there this week.   Pt also reports she has been very itchy lately and it is driving her crazy. She has changed detergents and tried OTC hydrocortisone cream and nothing helps. She is requesting a refill of 2.5% hydrocortisone cream which has been prescribed before. Advised this nurse would send a msg to PCP and f/u with her. Pt verbalized understanding.

## 2019-05-19 NOTE — Telephone Encounter (Signed)
Please call patient and find out where itching is occurring? Does she have a rash?

## 2019-05-20 NOTE — Telephone Encounter (Signed)
Spoke with patient and she still complains of itching. States that she is is itching all over, no visible rash except for on her back but she is not sure if that's from where she has been scratching or if maybe an actual rash. Pt states that she is very dizzy today, has been having dizziness for about 1.5 weeks. No nausea or vomiting.   Called Lilia Pro [phone]) RN, left a voicemail to call back regarding INR lab results.  Called [facility] [place] [phone]) and requested the speak with someone regarding results.  I was connected to [place] who was able to look up results. She is also faxing these results 05/13/19  INR 1.6 Hgb 8.2 PT 16.5  Will send to [place] to advise.

## 2019-05-20 NOTE — Telephone Encounter (Signed)
[name] returned [name]'s call. I let her know [name] received the results from [facility]. [name]'s out with covid for the rest of the week.  Any questions, call [facility].

## 2019-05-21 ENCOUNTER — Ambulatory Visit (INDEPENDENT_AMBULATORY_CARE_PROVIDER_SITE_OTHER): Payer: PPO

## 2019-05-21 DIAGNOSIS — Z7689 Persons encountering health services in other specified circumstances: Secondary | ICD-10-CM | POA: Diagnosis not present

## 2019-05-21 DIAGNOSIS — Z7901 Long term (current) use of anticoagulants: Secondary | ICD-10-CM

## 2019-05-21 DIAGNOSIS — Z01810 Encounter for preprocedural cardiovascular examination: Secondary | ICD-10-CM

## 2019-05-21 LAB — POCT INR: INR: 1.8 — AB (ref 2.0–3.0)

## 2019-05-21 NOTE — Telephone Encounter (Signed)
It has been over 1 week since her INR check. I am working with [facility] now to get a STAT INR on her today so that I can accurately dose her.  Patient aware that I will be back in touch with her with further instructions on INR.  I did give her info on Eucerin cream. She has some very thick diabetic gold bond cream that she will start using first and let us know if rash appears or itching is not improving.   Per Patient: Blood pressure has been generally running normal when nurse checks it.  She checked while on the phone with me and reading is 116/51, 63 (sitting).   She continues to feel very lethargic and feels very groggy worse as the day goes on.  Still having dizziness with position changes and not seeing any improvement.

## 2019-05-21 NOTE — Patient Instructions (Signed)
INR on 05/13/19: 1.7 however result did not get communicated to our office until 05/20/19.    [facility] did come out today to recheck the INR level for patient and will call with results over the weekend if out of range.  Otherwise will follow up on Monday. Patient will take 3 mg today and 3 mg tomorrow for a boost in the interim and then we will follow up from there based on level today and plan next recheck.   Patient does report increased fluid retention today and taking Metolazone X 1 earlier in the week and dropped 4 lbs. She denies any other changes in diet or medications but does continue to have weakness, dizziness and further general decline.  PCP is aware.  Patient is refusing palliative services at this point to aide in treatment management and goals for care.

## 2019-05-21 NOTE — Telephone Encounter (Signed)
Please call patient and find out how she is taking her warfarin. Her INR is low and she needs an adjustment in her medication. Her blood counts are stable. Not sure what is causing itch, try applying a thick lotion like Eucerin (store brand is fine) after bathing. How is blood pressure? Is dizziness with position change?

## 2019-05-21 NOTE — Telephone Encounter (Signed)
When you talk to her regarding her INR, please remind her to drink her full daily water allotment. I have asked her to do daily weights and record them. Are they stable. Hematocrit is stable which is reassuring for no worsening anemia. Some of this could be progression of heart failure, kidney disease. Is she interested in having palliative care come out and discuss goals of treatment? I have asked about this previously and she and family declined.

## 2019-05-21 NOTE — Telephone Encounter (Signed)
[facility] did come out and draw her blood today and will call me over the weekend if any type of panic reading and I will contact patient as needed.  She is going to go ahead and take an extra 1/2 pill today and tomorrow (refer to coag encounter note for details, patient is taking 2mg  daily at the present). I will follow up with her on Monday, if I do not end up speaking with her over the weekend due to abnormal results.   I did discuss with patient the rest of Debbie's message below.  Patient states that her weight has been increasing this week and was up 4 lbs.  She took metolazone and it came back down to goal.  While she is concerned about her dizziness, she adamantly states she is not ready for palliative care services despite my explaining there potential help with evaluation and management of dizziness.    FYI to Tor Netters, NP.

## 2019-05-22 LAB — PROTIME-INR

## 2019-05-22 NOTE — Telephone Encounter (Signed)
Noted  

## 2019-05-24 ENCOUNTER — Ambulatory Visit (INDEPENDENT_AMBULATORY_CARE_PROVIDER_SITE_OTHER): Payer: PPO

## 2019-05-24 DIAGNOSIS — Z7901 Long term (current) use of anticoagulants: Secondary | ICD-10-CM | POA: Diagnosis not present

## 2019-05-24 NOTE — Patient Instructions (Addendum)
Pre visit review using our clinic review tool, if applicable. No additional management support is needed unless otherwise documented below in the visit note.  INR on 1/15: pt is still subtherapeutic but had a boost of 1mg  on Friday and 1mg  on Sat. No changes to dosing today, continue 2 mg daily and recheck on 1/21. Contacted Mariann Laster at [facility] at [phone] and advised no change, continue 2mg  daily and recheck on 1/21. Mariann Laster verbalized understanding.  Contacted pt who denies any changes in medications or abnormal bleeding or bruising. Advised no change in dose, continue 2 mg daily and retest on 1/21. Advised if anything is needed before to contact this office. Pt verbalized understanding.

## 2019-05-24 NOTE — Telephone Encounter (Signed)
New INR reading was received from 1/15 and addressed in an anticoag encounter.

## 2019-05-25 NOTE — Progress Notes (Signed)
I have reviewed this visit and I agree on the patient's plan of dosage and recommendations. Deborah B Gessner, FNP   

## 2019-05-25 NOTE — Progress Notes (Signed)
I have reviewed this visit and I agree on the patient's plan of dosage and recommendations. Shuree Brossart B Leira Regino, FNP   

## 2019-05-26 DIAGNOSIS — Z03818 Encounter for observation for suspected exposure to other biological agents ruled out: Secondary | ICD-10-CM | POA: Diagnosis not present

## 2019-05-26 DIAGNOSIS — Z20822 Contact with and (suspected) exposure to covid-19: Secondary | ICD-10-CM | POA: Diagnosis not present

## 2019-05-26 DIAGNOSIS — Z20828 Contact with and (suspected) exposure to other viral communicable diseases: Secondary | ICD-10-CM | POA: Diagnosis not present

## 2019-05-27 ENCOUNTER — Telehealth: Payer: Self-pay | Admitting: Orthopedic Surgery

## 2019-05-27 LAB — POCT INR: INR: 2.4 (ref 2.0–3.0)

## 2019-05-27 NOTE — Telephone Encounter (Signed)
I called and sw HHN to advise that the pt was evaluated in November and [facility] was set up for dressing changed and the pt was supposed to follow up in 2 weeks and never came back to the office. HHN states that the pt's legs have healed and that she does have a bit of swelling and wanted to know what we should do moving forward. Asked for the pt to call the office and make an appt so that we can assess and most likely will measure for some type of compression sock for the pt. Will call with any questions.

## 2019-05-27 NOTE — Telephone Encounter (Signed)
[name]/[facility] called and stated that she needed to speak to someone about this patient  Leg.  Please call her [phone]

## 2019-05-28 ENCOUNTER — Ambulatory Visit (INDEPENDENT_AMBULATORY_CARE_PROVIDER_SITE_OTHER): Payer: PPO

## 2019-05-28 ENCOUNTER — Telehealth: Payer: Self-pay

## 2019-05-28 DIAGNOSIS — Z7901 Long term (current) use of anticoagulants: Secondary | ICD-10-CM | POA: Diagnosis not present

## 2019-05-28 NOTE — Patient Instructions (Addendum)
Pre visit review using our clinic review tool, if applicable. No additional management support is needed unless otherwise documented below in the visit note.  Contacted Nikki at [facility] for INR. She reports nurse sent a text to PCP yesterday. Advised to contact office and not PCP. Nikki reports INR stick in home was 2.4. Read back result and Lexine Baton verified result. Advised no changes in dosing, continue 2 mg daily and recheck in 2 wks. Nikki verbalized understanding.

## 2019-05-28 NOTE — Telephone Encounter (Signed)
Contacted Nikki at [facility] for INR. She reports nurse sent a text to PCP yesterday. Advised to contact office and not PCP. Nikki reports INR stick in home was 2.4. Read back result and Lexine Baton verified result. Advised no changes in dosing, recheck in 2 wks. Nikki verbalized understanding.

## 2019-05-31 ENCOUNTER — Other Ambulatory Visit: Payer: Self-pay

## 2019-05-31 ENCOUNTER — Telehealth: Payer: Self-pay

## 2019-05-31 ENCOUNTER — Encounter: Payer: Self-pay | Admitting: Orthopedic Surgery

## 2019-05-31 ENCOUNTER — Ambulatory Visit (INDEPENDENT_AMBULATORY_CARE_PROVIDER_SITE_OTHER): Payer: PPO | Admitting: Orthopedic Surgery

## 2019-05-31 VITALS — Ht 66.0 in | Wt 178.0 lb

## 2019-05-31 DIAGNOSIS — I87312 Chronic venous hypertension (idiopathic) with ulcer of left lower extremity: Secondary | ICD-10-CM | POA: Diagnosis not present

## 2019-05-31 DIAGNOSIS — L97909 Non-pressure chronic ulcer of unspecified part of unspecified lower leg with unspecified severity: Secondary | ICD-10-CM

## 2019-05-31 DIAGNOSIS — I87319 Chronic venous hypertension (idiopathic) with ulcer of unspecified lower extremity: Secondary | ICD-10-CM

## 2019-05-31 NOTE — Progress Notes (Signed)
Office Visit Note   Patient: [name]           Date of Birth: [birth year]           MRN: [id number] Visit Date: 05/31/2019              Requested by: Elby Beck, [address],  [place] [address] PCP: Elby Beck, FNP  Chief Complaint  Patient presents with  . Left Leg - Follow-up  . Right Leg - Follow-up      HPI: Patient is a 86 year old woman who presents in follow-up for bilateral lower extremity venous ulcers.  She was started on compression wraps in the hospital and was discharged with [facility] compression wraps.  Patient states they ran out of wraps and only wrapped her left leg.  Assessment & Plan: Visit Diagnoses:  1. Chronic venous hypertension w ulceration ([place])     Plan: Patient was given an order and we will also call [facility] for them to provide a 4-layer compression wrap for both legs changed weekly for 4 weeks.  Discussed with the patient to prevent recurrence of the ulcers she will need to wear a knee-high compression stocking after we have completed compression wraps.  Follow-Up Instructions: Return in about 4 weeks (around 06/28/2019).   Ortho Exam  Patient is alert, oriented, no adenopathy, well-dressed, normal affect, normal respiratory effort. Examination patient has swelling with weeping edema from the right lower extremity.  There is dermatitis with no clinical signs of infection the right calf is 45 cm in circumference the left calf measures 39 cm in circumference.  Examination the left leg the ulcers have completely healed there is no drainage no cellulitis she does have dermatitis of both legs.  Imaging: No results found. No images are attached to the encounter.  Labs: Lab Results  Component Value Date   HGBA1C 6.1 (H) 08/17/2014   HGBA1C 5.6 01/14/2014   ESRSEDRATE 2 02/10/2012   REPTSTATUS 11/06/2018 FINAL 11/04/2018   GRAMSTAIN NO WBC SEEN NO ORGANISMS SEEN  02/21/2016   CULT  (A) 11/04/2018    <10,000 COLONIES/mL INSIGNIFICANT GROWTH Performed at [facility], [place] [address] [place], [place] [address]    LABORGA ESCHERICHIA COLI 12/24/2016     Lab Results  Component Value Date   ALBUMIN 3.7 05/06/2019   ALBUMIN 3.0 (L) 03/03/2019   ALBUMIN 3.0 (L) 03/02/2019    Lab Results  Component Value Date   MG 2.1 03/10/2019   MG 2.1 03/08/2019   MG 2.4 03/05/2019   No results found for: VD25OH  No results found for: PREALBUMIN CBC EXTENDED Latest Ref Rng & Units 05/06/2019 04/04/2019 03/22/2019  WBC 4.0 - 10.5 K/uL 3.6(L) 4.0 4.6  RBC 3.87 - 5.11 Mil/uL 3.74(L) 3.39(L) 2.95(L)  HGB 12.0 - 15.0 g/dL 8.2 Repeated and verified X2.(L) 8.7(L) 8.1(L)  HCT 36.0 - 46.0 % 27.5(L) 30.7(L) 25.9(L)  PLT 150.0 - 400.0 K/uL 270.0 245 226  NEUTROABS 1.4 - 7.7 K/uL 2.5 - 3.3  LYMPHSABS 0.7 - 4.0 K/uL 0.5(L) - 0.6(L)     Body mass index is 28.73 kg/m.  Orders:  No orders of the defined types were placed in this encounter.  No orders of the defined types were placed in this encounter.    Procedures: No procedures performed  Clinical Data: No additional findings.  ROS:  All other systems negative, except as noted in the HPI. Review of Systems  Objective:  Vital Signs: Ht 5\' 6"  (1.676 m)   Wt 178 lb (80.7 kg)   BMI 28.73 kg/m   Specialty Comments:  No specialty comments available.  PMFS History: Patient Active Problem List   Diagnosis Date Noted  . Chronic venous hypertension w ulceration ([place])   . Melena   . AKI (acute kidney injury) ([place]) 03/03/2019  . Symptomatic anemia 02/25/2019  . Acute on chronic diastolic CHF (congestive heart failure), NYHA class 4 ([place]) 11/04/2018  . Acute blood loss anemia 11/04/2018  . Chronic bilateral pleural effusions 11/04/2018  . Shortness of breath 10/21/2018  . Displaced fracture of left femoral neck ([place]) 10/09/2018  . Somnolence   . Closed left hip fracture ([place]) 10/07/2018  . Fall   .  Hypokalemia   . Afib ([place]) 06/01/2018  . Urinary frequency 09/18/2017  . Iliac artery stenosis, bilateral ([place]) 07/14/2017  . Recurrent UTI 07/14/2017  . Long term (current) use of anticoagulants 05/08/2017  . Anemia 11/18/2016  . Dysuria 11/18/2016  . Pulmonary hypertension ([place]) 12/04/2015  . B12 deficiency 05/22/2015  . PAD (peripheral artery disease) ([place])   . Elevated serum creatinine   . Renal cell carcinoma ([place])   . S/p nephrectomy   . TIA (transient ischemic attack) 08/16/2014  . Weakness 01/14/2014  . Chronic kidney disease (CKD), stage IV (severe) ([place]) 12/08/2013  . Preoperative cardiovascular examination 06/07/2013  . Chronic diastolic CHF (congestive heart failure) ([place]) 06/04/2013  . Mitral valve regurgitation 06/04/2013  . Allergic rhinitis 01/01/2013  . Chronic cholecystitis with calculus 09/15/2012  . Claudication ([place]) 01/21/2012  . Dizziness 11/26/2011  . Hypertension 05/20/2011  . Avascular necrosis of hip ([place]) 05/03/2011  . OTHER AND UNSPECIFIED COAGULATION DEFECTS 10/13/2009  . RECTAL BLEEDING 10/13/2009  . HOCM / IHSS 07/14/2009  . EDEMA 07/14/2009  . Hyperlipidemia 04/10/2009  . ADJUSTMENT DISORDER WITH ANXIETY 04/10/2009  . INCONTINENCE, URGE 04/10/2009  . Permanent atrial fibrillation 10/27/2008  . SINUS BRADYCARDIA 10/27/2008  . Carotid artery stenosis 10/27/2008  . COPD (chronic obstructive pulmonary disease) ([place]) 10/27/2008  . ICD (implantable cardioverter-defibrillator) in place 10/27/2008   Past Medical History:  Diagnosis Date  . Adjustment disorder with anxiety   . Anginal pain ([place])   . Arthritis    "some in my hands" (11/11/2012)  . Atrial fibrillation ([place])   . Automatic implantable cardioverter-defibrillator in situ   . Blood in stool   . Carotid artery stenosis 09/2007   a. 09/2007: 60-79% bilateral (stable); b. 10/2008: 40-59% R 60-79%   . Chest pain, unspecified   . Chronic airway obstruction, not elsewhere classified   .  Chronic bronchitis ([place])    "get it some; not q year" (11/11/2012)  . Chronic diastolic CHF (congestive heart failure) ([place]) 06/04/2013  . Chronic kidney disease, unspecified   . Coronary artery disease    non-obstructive by 2006 cath  . Frequent UTI    "get them a couple times/yr" (11/11/2012)  . Hemorrhage of rectum and anus   . High cholesterol   . History of blood transfusion 04/2011   "after hip OR" (11/11/2012)  . Hypertension 05/20/2011  . Hypertr obst cardiomyop   . Hypotension, unspecified    cardiac cath 2006..nonobstructive CAD 30-40s lesions.Marland KitchenETT 1/09 nondiagnostic due to poor HR response..Right Renal Cancer 2003  . Long term (current) use of anticoagulants   . Malignant neoplasm of kidney, except pelvis   . Osteoarthritis of right hip   . Other and unspecified coagulation defects   . PONV (postoperative nausea  and vomiting)   . Renal cancer ([place]) 06/2001   Right  . Secondary cardiomyopathy, unspecified   . Sinus bradycardia   . Urge incontinence     Family History  Problem Relation Age of Onset  . Heart failure Mother   . Breast cancer Maternal Aunt 82  . Breast cancer Cousin   . Breast cancer Other   . Colon cancer Neg Hx     Past Surgical History:  Procedure Laterality Date  . ABDOMINAL HYSTERECTOMY  1975   for benign causes  . APPENDECTOMY    . BI-VENTRICULAR PACEMAKER UPGRADE  05/04/2010  . BIOPSY  02/28/2019   Procedure: BIOPSY;  Surgeon: Milus Banister, MD;  Location: [facility] ENDOSCOPY;  Service: Endoscopy;;  . CARDIAC CATHETERIZATION  2006  . CARDIOVERSION N/A 02/20/2018   Procedure: CARDIOVERSION;  Surgeon: Minna Merritts, MD;  Location: [place] ORS;  Service: Cardiovascular;  Laterality: N/A;  . CARDIOVERSION N/A 03/27/2018   Procedure: CARDIOVERSION;  Surgeon: Minna Merritts, MD;  Location: [facility] ORS;  Service: Cardiovascular;  Laterality: N/A;  . CATARACT EXTRACTION W/ INTRAOCULAR LENS  IMPLANT, BILATERAL  01/2006-02-2006  . CHOLECYSTECTOMY N/A 11/11/2012    Procedure: LAPAROSCOPIC CHOLECYSTECTOMY WITH INTRAOPERATIVE CHOLANGIOGRAM;  Surgeon: Imogene Burn. Georgette Dover, MD;  Location: [place];  Service: General;  Laterality: N/A;  . COLONOSCOPY WITH PROPOFOL N/A 02/28/2019   Procedure: COLONOSCOPY WITH PROPOFOL;  Surgeon: Milus Banister, MD;  Location[facility] ENDOSCOPY;  Service: Endoscopy;  Laterality: N/A;  . EP IMPLANTABLE DEVICE N/A 02/21/2016   Procedure: ICD Generator Changeout;  Surgeon: Deboraha Sprang, MD;  Location: [place] CV LAB;  Service: Cardiovascular;  Laterality: N/A;  . ESOPHAGOGASTRODUODENOSCOPY (EGD) WITH PROPOFOL N/A 02/28/2019   Procedure: ESOPHAGOGASTRODUODENOSCOPY (EGD) WITH PROPOFOL;  Surgeon: Milus Banister, MD;  Location: [facility] ENDOSCOPY;  Service: Endoscopy;  Laterality: N/A;  . GIVENS CAPSULE STUDY N/A 03/15/2019   Procedure: GIVENS CAPSULE STUDY;  Surgeon: Thornton Park, MD;  Location: [place];  Service: Gastroenterology;  Laterality: N/A;  . INSERT / REPLACE / REMOVE PACEMAKER  05-01-11   02-28-05-/05-04-10-ICD-MEDTRONIC MAXIMAL DR  . JOINT REPLACEMENT    . LAPAROSCOPIC CHOLECYSTECTOMY  11/11/2012  . LAPAROSCOPIC LYSIS OF ADHESIONS N/A 11/11/2012   Procedure: LAPAROSCOPIC LYSIS OF ADHESIONS;  Surgeon: Imogene Burn. Georgette Dover, MD;  Location: [place];  Service: General;  Laterality: N/A;  . NEPHRECTOMY Right 06/2001    S/P RENAL CELL CANCER  . PRESSURE SENSOR/CARDIOMEMS N/A 02/03/2019   Procedure: PRESSURE SENSOR/CARDIOMEMS;  Surgeon: Larey Dresser, MD;  Location: [place] CV LAB;  Service: Cardiovascular;  Laterality: N/A;  . RIGHT HEART CATH N/A 11/09/2018   Procedure: RIGHT HEART CATH;  Surgeon: Larey Dresser, MD;  Location: [place] CV LAB;  Service: Cardiovascular;  Laterality: N/A;  . RIGHT HEART CATH N/A 03/08/2019   Procedure: RIGHT HEART CATH;  Surgeon: Larey Dresser, MD;  Location: [place] CV LAB;  Service: Cardiovascular;  Laterality: N/A;  . SUBMUCOSAL TATTOO INJECTION  02/28/2019   Procedure: SUBMUCOSAL  TATTOO INJECTION;  Surgeon: Milus Banister, MD;  [facility];  Service: Endoscopy;;  . TOTAL HIP ARTHROPLASTY Right 05/03/2011   Procedure: TOTAL HIP ARTHROPLASTY ANTERIOR APPROACH;  Surgeon: Mcarthur Rossetti;  Location: [facility] ORS;  Service: Orthopedics;  Laterality: Right;  Removal of Cannulated Screws Right Hip, Right Direct Anterior Hip Replacement  . TOTAL HIP ARTHROPLASTY Left 10/09/2018   Procedure: TOTAL HIP ARTHROPLASTY ANTERIOR APPROACH;  Surgeon: Rod Can, MD;  Location: [place];  Service: Orthopedics;  Laterality: Left;   Social History   Occupational History  . Occupation: Retired    Fish farm manager: RETIRED  Tobacco Use  . Smoking status: Former Smoker    Packs/day: 0.50    Years: 40.00    Pack years: 20.00    Types: Cigarettes    Quit date: 05/06/2001    Years since quitting: 18.0  . Smokeless tobacco: Never Used  Substance and Sexual Activity  . Alcohol use: No    Alcohol/week: 0.0 standard drinks  . Drug use: No  . Sexual activity: Never

## 2019-05-31 NOTE — Telephone Encounter (Signed)
I called Lilia Pro @[facility]  [phone] to advise of Dr Jess Barters orders to continue home health services for patient's 4 layer compression wraps for 4 weeks and to change weekly. I lvm for services and to return call if anything is needed.

## 2019-06-01 NOTE — Progress Notes (Signed)
I have reviewed this visit and I agree on the patient's plan of dosage and recommendations. Juergen Hardenbrook B Glendell Fouse, FNP   

## 2019-06-03 ENCOUNTER — Other Ambulatory Visit: Payer: Self-pay

## 2019-06-03 ENCOUNTER — Ambulatory Visit (HOSPITAL_COMMUNITY)
Admission: RE | Admit: 2019-06-03 | Discharge: 2019-06-03 | Disposition: A | Discharge status: Home / Self Care | Payer: PPO | Source: Ambulatory Visit | Attending: Cardiology | Admitting: Cardiology

## 2019-06-03 ENCOUNTER — Telehealth: Payer: Self-pay | Admitting: Family Medicine

## 2019-06-03 ENCOUNTER — Encounter (HOSPITAL_COMMUNITY): Payer: Self-pay | Admitting: Cardiology

## 2019-06-03 VITALS — BP 147/45 | HR 66 | Wt 180.2 lb

## 2019-06-03 DIAGNOSIS — J449 Chronic obstructive pulmonary disease, unspecified: Secondary | ICD-10-CM | POA: Insufficient documentation

## 2019-06-03 DIAGNOSIS — N183 Chronic kidney disease, stage 3 unspecified: Secondary | ICD-10-CM | POA: Diagnosis not present

## 2019-06-03 DIAGNOSIS — Z79899 Other long term (current) drug therapy: Secondary | ICD-10-CM | POA: Diagnosis not present

## 2019-06-03 DIAGNOSIS — E785 Hyperlipidemia, unspecified: Secondary | ICD-10-CM | POA: Diagnosis not present

## 2019-06-03 DIAGNOSIS — Z8249 Family history of ischemic heart disease and other diseases of the circulatory system: Secondary | ICD-10-CM | POA: Insufficient documentation

## 2019-06-03 DIAGNOSIS — Z803 Family history of malignant neoplasm of breast: Secondary | ICD-10-CM | POA: Insufficient documentation

## 2019-06-03 DIAGNOSIS — I428 Other cardiomyopathies: Secondary | ICD-10-CM | POA: Insufficient documentation

## 2019-06-03 DIAGNOSIS — I272 Pulmonary hypertension, unspecified: Secondary | ICD-10-CM | POA: Insufficient documentation

## 2019-06-03 DIAGNOSIS — I4821 Permanent atrial fibrillation: Secondary | ICD-10-CM

## 2019-06-03 DIAGNOSIS — I5042 Chronic combined systolic (congestive) and diastolic (congestive) heart failure: Secondary | ICD-10-CM | POA: Insufficient documentation

## 2019-06-03 DIAGNOSIS — Z85528 Personal history of other malignant neoplasm of kidney: Secondary | ICD-10-CM | POA: Diagnosis not present

## 2019-06-03 DIAGNOSIS — Z79890 Hormone replacement therapy (postmenopausal): Secondary | ICD-10-CM | POA: Insufficient documentation

## 2019-06-03 DIAGNOSIS — D649 Anemia, unspecified: Secondary | ICD-10-CM

## 2019-06-03 DIAGNOSIS — I2510 Atherosclerotic heart disease of native coronary artery without angina pectoris: Secondary | ICD-10-CM | POA: Diagnosis not present

## 2019-06-03 DIAGNOSIS — Z87891 Personal history of nicotine dependence: Secondary | ICD-10-CM | POA: Diagnosis not present

## 2019-06-03 DIAGNOSIS — I6522 Occlusion and stenosis of left carotid artery: Secondary | ICD-10-CM | POA: Insufficient documentation

## 2019-06-03 DIAGNOSIS — D509 Iron deficiency anemia, unspecified: Secondary | ICD-10-CM | POA: Insufficient documentation

## 2019-06-03 DIAGNOSIS — I130 Hypertensive heart and chronic kidney disease with heart failure and stage 1 through stage 4 chronic kidney disease, or unspecified chronic kidney disease: Secondary | ICD-10-CM | POA: Diagnosis not present

## 2019-06-03 DIAGNOSIS — I739 Peripheral vascular disease, unspecified: Secondary | ICD-10-CM | POA: Diagnosis not present

## 2019-06-03 DIAGNOSIS — Z905 Acquired absence of kidney: Secondary | ICD-10-CM | POA: Insufficient documentation

## 2019-06-03 DIAGNOSIS — Z7901 Long term (current) use of anticoagulants: Secondary | ICD-10-CM | POA: Insufficient documentation

## 2019-06-03 DIAGNOSIS — Z95810 Presence of automatic (implantable) cardiac defibrillator: Secondary | ICD-10-CM | POA: Insufficient documentation

## 2019-06-03 DIAGNOSIS — I5032 Chronic diastolic (congestive) heart failure: Secondary | ICD-10-CM

## 2019-06-03 LAB — CBC
HCT: 29.4 % — ABNORMAL LOW (ref 36.0–46.0)
Hemoglobin: 8.2 g/dL — ABNORMAL LOW (ref 12.0–15.0)
MCH: 20.3 pg — ABNORMAL LOW (ref 26.0–34.0)
MCHC: 27.9 g/dL — ABNORMAL LOW (ref 30.0–36.0)
MCV: 73 fL — ABNORMAL LOW (ref 80.0–100.0)
Platelets: 266 10*3/uL (ref 150–400)
RBC: 4.03 MIL/uL (ref 3.87–5.11)
RDW: 19.9 % — ABNORMAL HIGH (ref 11.5–15.5)
WBC: 3.8 10*3/uL — ABNORMAL LOW (ref 4.0–10.5)
nRBC: 0 % (ref 0.0–0.2)

## 2019-06-03 LAB — BASIC METABOLIC PANEL
Anion gap: 10 (ref 5–15)
BUN: 29 mg/dL — ABNORMAL HIGH (ref 8–23)
CO2: 24 mmol/L (ref 22–32)
Calcium: 8.6 mg/dL — ABNORMAL LOW (ref 8.9–10.3)
Chloride: 100 mmol/L (ref 98–111)
Creatinine, Ser: 1.65 mg/dL — ABNORMAL HIGH (ref 0.44–1.00)
GFR calc Af Amer: 33 mL/min — ABNORMAL LOW (ref >60)
GFR calc non Af Amer: 28 mL/min — ABNORMAL LOW (ref >60)
Glucose, Bld: 105 mg/dL — ABNORMAL HIGH (ref 70–99)
Potassium: 4.4 mmol/L (ref 3.5–5.1)
Sodium: 134 mmol/L — ABNORMAL LOW (ref 135–145)

## 2019-06-03 LAB — BRAIN NATRIURETIC PEPTIDE: B Natriuretic Peptide: 386.1 pg/mL — ABNORMAL HIGH (ref 0.0–100.0)

## 2019-06-03 LAB — IRON AND TIBC
Iron: 23 ug/dL — ABNORMAL LOW (ref 28–170)
Saturation Ratios: 5 % — ABNORMAL LOW (ref 10.4–31.8)
TIBC: 421 ug/dL (ref 250–450)
UIBC: 398 ug/dL

## 2019-06-03 LAB — LIPID PANEL
Cholesterol: 130 mg/dL (ref 0–200)
HDL: 34 mg/dL — ABNORMAL LOW (ref >40)
LDL Cholesterol: 79 mg/dL (ref 0–99)
Total CHOL/HDL Ratio: 3.8 RATIO
Triglycerides: 83 mg/dL (ref <150)
VLDL: 17 mg/dL (ref 0–40)

## 2019-06-03 LAB — FERRITIN: Ferritin: 9 ng/mL — ABNORMAL LOW (ref 11–307)

## 2019-06-03 MED ORDER — POTASSIUM CHLORIDE CRYS ER 20 MEQ PO TBCR: 40.0000 meq | EXTENDED_RELEASE_TABLET | Freq: Two times a day (BID) | ORAL | 0 refills | Status: DC

## 2019-06-03 MED ORDER — METOLAZONE 5 MG PO TABS: 5.0000 mg | ORAL_TABLET | ORAL | 3 refills | Status: DC

## 2019-06-03 NOTE — Progress Notes (Signed)
Called [facility] at [phone] and gave them VO to draw BMET in 1 week.

## 2019-06-03 NOTE — Telephone Encounter (Signed)
Ria Comment with [facility] called and left a voicemail requesting a call back for verbal orders for PT/INR (no call back number left on voicemail). They were not able to draw PT/INR today as the patient was not home at the time of their scheduled visit - pt was at another doctors appt. Ria Comment stated that she can check these tomorrow 06/04/19 but needs orders from [place].   Please advise Jackelyn Poling, thanks.  [facility] direct number is [phone]

## 2019-06-03 NOTE — Patient Instructions (Addendum)
Take Metolazone 5 mg on Friday and Sunday, then starting next week take it every Tuesday and Thursday  Take an extra 20 meq (1 tab) of Potassium every time you take Metolazone  Labs done today, your results will be available in MyChart, we will contact you for abnormal readings.  Please lay on your Cardiomems pillow every morning and send a transmission  Your physician recommends that you return for lab work in: 1 week, [facility] will draw this for you  Your physician recommends that you schedule a follow-up appointment in: 2 weeks  If you have any questions or concerns before your next appointment please send [place] a message through [facility] or call our office at [phone].  At the [facility], you and your health needs are our priority. As part of our continuing mission to provide you with exceptional heart care, we have created designated Provider Care Teams. These Care Teams include your primary Cardiologist (physician) and Advanced Practice Providers (APPs- Physician Assistants and Nurse Practitioners) who all work together to provide you with the care you need, when you need it.   You may see any of the following providers on your designated Care Team at your next follow up: Marland Kitchen Dr Glori Bickers . Dr Loralie Champagne . Darrick Grinder, NP . Lyda Jester, PA . Audry Riles, PharmD   Please be sure to bring in all your medications bottles to every appointment.

## 2019-06-04 ENCOUNTER — Other Ambulatory Visit (HOSPITAL_COMMUNITY): Payer: Self-pay

## 2019-06-04 ENCOUNTER — Ambulatory Visit (INDEPENDENT_AMBULATORY_CARE_PROVIDER_SITE_OTHER): Payer: PPO

## 2019-06-04 DIAGNOSIS — Z7901 Long term (current) use of anticoagulants: Secondary | ICD-10-CM

## 2019-06-04 DIAGNOSIS — E611 Iron deficiency: Secondary | ICD-10-CM

## 2019-06-04 DIAGNOSIS — Z01810 Encounter for preprocedural cardiovascular examination: Secondary | ICD-10-CM

## 2019-06-04 LAB — POCT INR: INR: 2.9 (ref 2.0–3.0)

## 2019-06-04 NOTE — Patient Instructions (Signed)
Pre visit review using our clinic review tool, if applicable. No additional management support is needed unless otherwise documented below in the visit note.  Kathlee Nations from [facility] called with PT/INR results. INR 2.9 2mg  daily,  at [phone]. Contacted Kathlee Nations and advised no changes in dosing, continue 2mg  daily. Recheck in 3 wks. Kathlee Nations read back orders and verbalized understanding.

## 2019-06-04 NOTE — Telephone Encounter (Signed)
Kathlee Nations from [facility] called with PT/INR results. INR 2.9 2mg  daily. Please call Kathlee Nations at [phone] with directions and order for next test.

## 2019-06-04 NOTE — Telephone Encounter (Signed)
Contacted Kathlee Nations and documented in anticoag note.

## 2019-06-04 NOTE — Telephone Encounter (Signed)
Called [facility] and gave verbal order for PT/INR for today.

## 2019-06-05 NOTE — Progress Notes (Signed)
PCP: Elby Beck, FNP Cardiology: Dr. Rockey Situ HF Cardiology: Dr. [name]  Patient has history of permanent atrial fibrillation, chronic diastolic CHF, CKD stage 3, smoking/prior COPD, renal cell CA s/p right nephrectomy.  She has a prior history of HFrEF with nonischemic cardiomyoapthy and had a Medtronic ICD placed.  However, subsequently her EF has recovered.  More recently, she has had symptomatic HFpEF.    She has had dyspnea and edema for "a long time," but it has been worse since January of this year. In 6/20, she had left hip ORIF after mechanical fall.  Echo in 6/20 showed EF 60-65%, mild RV dilation with normal systolic function, PASP 93 mmHg.  After she got home from the hospital stay post-ORIF, she noted marked peripheral edema.  She was short of breath walking short distances.  +orthopnea.  She came back to the hospital on 11/04/18 and was admitted with volume overload.  She was diuresed aggressively and lost about 22 lbs.  RHC showed pulmonary venous hypertension.  PCWP tracing had prominent v-waves in absence of significant MR, suggesting significant diastolic dysfunction.   She has been noted to have significant Fe deficiency anemia, but FOBT was negative during 7/20 hospitalization.   She was admitted in 11/20 with symptomatic anemia, GI workup concerning for ischemic bowel.  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 Doren Custard with VVS and conservative management recommended.  Capsule endoscopy showed no definite source of bleeding.  She was volume overloaded due to significant RV dysfunction and diuresed, she also developed AKI on CKD stage 3.      She is now back at home, living with her grandson.  She is taking torsemide 80 mg bid and occasionally takes metolazone. She continues to be short of breath walking around her house.  She uses a walker or cane. She has orthopnea and sleeps propped up.  Weight has been gradually increasing.  Legs wrapped.  Occasional  chest heaviness.    Labs (7/20): K 3.7, creatinine 1.44 => 1.33 Labs (12/20): pro-BNP 431, K 4.4, creatinine 1.24, hgb 8.2  PMH: 1. COPD 2. Renal cell carcinoma: S/p right kidney resection.  3. CKD stage 3 4. Fe deficiency anemia 5. Atrial fibrillation: Permanent. She has failed amiodarone, Tikosyn, and flecainide as well as multiple cardioversions. 6. Chronic diastolic CHF: She has history of prior HFrEF with nonischemic cardiomyopathy.  She has a Medtronic ICD.   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  - More recently, EF has been in normal range but she has had diastolic CHF.  - Echo (8/24): EF 60-65%, mildly dilated RV with PASP 93 mmHg, mild-moderate TR.  - RHC (7/20): mean RA 11, PA 64/18 mean 39, mean PCWP 24 with v waves to 41, CI 4.21, PVR 1.9 WU.  - Cardiomems placement - RHC (11/20): mean RA 13, PA 63/23 mean 25 with v waves to 47 (MR only mild by echo), mean PCWP 25, CI 3.18, PVR 2.6 WU 7. Hyperlipidemia 8. Carotid stenosis: Carotid dopplers (2/35) with 36-14% LICA stenosis.   9. Possible ischemic bowel in 11/20 10. 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 Conservative management per Dr. Doren Custard.   Social History   Socioeconomic History  . Marital status: Widowed    Spouse name: Not on file  . Number of children: Not on file  . Years of education: Not on file  . Highest education level: Not on file  Occupational History  . Occupation: Retired  Employer: RETIRED  Tobacco Use  . Smoking status: Former Smoker    Packs/day: 0.50    Years: 40.00    Pack years: 20.00    Types: Cigarettes    Quit date: 05/06/2001    Years since quitting: 18.0  . Smokeless tobacco: Never Used  Substance and Sexual Activity  . Alcohol use: No    Alcohol/week: 0.0 standard drinks  . Drug use: No  . Sexual activity: Never  Other Topics Concern  . Not on file  Social History Narrative   WIDOW   CHILDREN AND GRANDCHILDREN ALL LIVE CLOSE BY BUT SHE  LIVES ALONE.   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   ALCOHOL USE-NO   RETIRED   FORMER TOBACCO USE.....1/2 PACK X 77 YRS UNTIL 2003   Would desire CPR   Social Determinants of Health   Financial Resource Strain:   . Difficulty of Paying Living Expenses: Not on file  Food Insecurity:   . Worried About Charity fundraiser in the Last Year: Not on file  . Ran Out of Food in the Last Year: Not on file  Transportation Needs:   . Lack of Transportation (Medical): Not on file  . Lack of Transportation (Non-Medical): Not on file  Physical Activity:   . Days of Exercise per Week: Not on file  . Minutes of Exercise per Session: Not on file  Stress:   . Feeling of Stress : Not on file  Social Connections:   . Frequency of Communication with Friends and Family: Not on file  . Frequency of Social Gatherings with Friends and Family: Not on file  . Attends Religious Services: Not on file  . Active Member of Clubs or Organizations: Not on file  . Attends Archivist Meetings: Not on file  . Marital Status: Not on file  Intimate Partner Violence:   . Fear of Current or Ex-Partner: Not on file  . Emotionally Abused: Not on file  . Physically Abused: Not on file  . Sexually Abused: Not on file   Family History  Problem Relation Age of Onset  . Heart failure Mother   . Breast cancer Maternal Aunt 82  . Breast cancer Cousin   . Breast cancer Other   . Colon cancer Neg Hx    ROS: All systems reviewed and negative except as per HPI.   Current Outpatient Medications  Medication Sig Dispense Refill  . acetaminophen (TYLENOL) 500 MG tablet Take 500-1,000 mg by mouth every 8 (eight) hours as needed for mild pain or headache.     . bisacodyl (DULCOLAX) 5 MG EC tablet Take 1 tablet (5 mg total) by mouth daily as needed for moderate constipation. 10 tablet 0  . cetirizine (ZYRTEC) 10 MG tablet Take 10 mg by mouth daily.     Marland Kitchen conjugated  estrogens (PREMARIN) vaginal cream Place 1 Applicatorful vaginally daily. (Patient taking differently: Place 1 Applicatorful vaginally daily as needed (vaginal dryness). ) 42.5 g 12  . Cranberry (AZO CRANBERRY GUMMIES) 500 MG CHEW Chew 2,000 mg by mouth daily.    Marland Kitchen ezetimibe (ZETIA) 10 MG tablet Take 1 tablet (10 mg total) by mouth daily. 30 tablet 11  . ferrous sulfate 325 (65 FE) MG tablet Take 1 tablet (325 mg total) by mouth 2 (two) times daily with a meal. 60 tablet 0  . fluticasone (FLONASE) 50 MCG/ACT nasal spray PLACE 2 SPRAYS INTO BOTH NOSTRILS ONCE  DAILY AS DIRECTED 16 g 6  . hydrocortisone 2.5 % cream Apply 1 application topically daily as needed (to itchy areas).     Marland Kitchen levothyroxine (SYNTHROID) 25 MCG tablet TAKE 1 TABLET BY MOUTH ONCE DAILY BEFOREBREAKFAST 30 tablet 3  . magnesium oxide (MAG-OX) 400 MG tablet Take 1 tablet (400 mg total) by mouth daily. 30 tablet 2  . metolazone (ZAROXOLYN) 5 MG tablet Take 1 tablet (5 mg total) by mouth 2 (two) times a week. Every Tue and Thursday 10 tablet 3  . potassium chloride SA (KLOR-CON) 20 MEQ tablet Take 2 tablets (40 mEq total) by mouth 2 (two) times daily. Take 1 extra tab on Tue and Thur with metolazone 135 tablet 0  . rosuvastatin (CRESTOR) 40 MG tablet Take 1 tablet (40 mg total) by mouth daily. (Patient taking differently: Take 40 mg by mouth at bedtime. ) 90 tablet 3  . torsemide (DEMADEX) 20 MG tablet Take 4 tablets (80 mg total) by mouth 2 (two) times daily. 60 tablet 0  . vitamin C (ASCORBIC ACID) 250 MG tablet Take 500 mg by mouth daily.    Marland Kitchen warfarin (COUMADIN) 2 MG tablet Take 1 tablet (2 mg total) by mouth one time only at 6 PM for 1 dose. Directions for dosing per coumadin clinic. 90 tablet 1   No current facility-administered medications for this encounter.   BP (!) 147/45   Pulse 66   Wt 81.7 kg (180 lb 3.2 oz)   SpO2 91%   BMI 29.09 kg/m  General: NAD Neck: JVP 9-10 cm, no thyromegaly or thyroid nodule.  Lungs: Clear  to auscultation bilaterally with normal respiratory effort. CV: Nondisplaced PMI.  Heart irregular S1/S2, no S3/S4, 2/6 HSM LLSB. 2+ edema to knees, legs wrapped.  No carotid bruit.  Normal pedal pulses.  Abdomen: Soft, nontender, no hepatosplenomegaly, no distention.  Skin: Intact without lesions or rashes.  Neurologic: Alert and oriented x 3.  Psych: Normal affect. Extremities: No clubbing or cyanosis.  HEENT: Normal.   Assessment/Plan: 1. Chronic diastolic CHF: Echo in 1/61 showed EF 60-65%, mild RV dilation with normal systolic function, PASP 93 mmHg. She had remote nonischemic cardiomyopathy with recovery of EF, has Medtronic ICD. I suspect that there is significant RV dysfunction. Atrial fibrillation likely contributes, but this appears to be permanent now as she has failed multiple anti-arrhythmics and is reasonably rate-controlled. [place] 11/20 showed elevated filling pressures and pulmonary venous hypertension.  Prominent v-waves on PCWP tracing likely due to stiff ventricle/diastolic dysfunction as she had only mild MR on recent echo.  Since discharge from hospital, she has begun to build up volume again and is short of breath with mild exertion (NYHA class III).  She is volume overloaded on exam. - Continue torsemide 80 mg bid.  I will have her take metolazone 2.5 mg on Friday and again on Sunday.  After that, she will take metolazone 2.5 mg every Tuesday and Thursday. Take an extra KCl 20 on days she takes metolazone.    - She needs to starting using her Cardiomems now that she is back at home.  2. Pulmonary hypertension: PA systolic pressure estimated 93 mmHg on echo. Based on [place] 11/20, she has primarily pulmonary venous hypertension.  3. CKD: Stage 3. BMET today and again in 10 days.    4. Anemia: Fe deficiency. She denies overt bleeding.  Capsule endoscopy during 11/20 admission did not show bleeding source.  -Check CBC, Fe studies.  5. Atrial fibrillation: Permanent now.  Rate  control is reasonable. She has failed amiodarone, Tikosyn, and flecainide as well as multiple cardioversions.  - Continue warfarin.   She will followup with NP/PA in 2 weeks to reassess volume status.   Loralie Champagne 06/05/2019

## 2019-06-09 ENCOUNTER — Ambulatory Visit (HOSPITAL_COMMUNITY)
Admission: RE | Admit: 2019-06-09 | Discharge: 2019-06-09 | Disposition: A | Discharge status: Home / Self Care | Payer: PPO | Source: Ambulatory Visit | Attending: Cardiology | Admitting: Cardiology

## 2019-06-09 ENCOUNTER — Other Ambulatory Visit: Payer: Self-pay

## 2019-06-09 DIAGNOSIS — M6281 Muscle weakness (generalized): Secondary | ICD-10-CM | POA: Diagnosis not present

## 2019-06-09 DIAGNOSIS — I130 Hypertensive heart and chronic kidney disease with heart failure and stage 1 through stage 4 chronic kidney disease, or unspecified chronic kidney disease: Secondary | ICD-10-CM | POA: Diagnosis not present

## 2019-06-09 DIAGNOSIS — D649 Anemia, unspecified: Secondary | ICD-10-CM | POA: Diagnosis not present

## 2019-06-09 DIAGNOSIS — E611 Iron deficiency: Secondary | ICD-10-CM | POA: Insufficient documentation

## 2019-06-09 DIAGNOSIS — Z95810 Presence of automatic (implantable) cardiac defibrillator: Secondary | ICD-10-CM | POA: Diagnosis not present

## 2019-06-09 DIAGNOSIS — R2689 Other abnormalities of gait and mobility: Secondary | ICD-10-CM | POA: Diagnosis not present

## 2019-06-09 DIAGNOSIS — J449 Chronic obstructive pulmonary disease, unspecified: Secondary | ICD-10-CM | POA: Diagnosis not present

## 2019-06-09 DIAGNOSIS — N183 Chronic kidney disease, stage 3 unspecified: Secondary | ICD-10-CM | POA: Diagnosis not present

## 2019-06-09 DIAGNOSIS — I5033 Acute on chronic diastolic (congestive) heart failure: Secondary | ICD-10-CM | POA: Diagnosis not present

## 2019-06-09 DIAGNOSIS — I4821 Permanent atrial fibrillation: Secondary | ICD-10-CM | POA: Diagnosis not present

## 2019-06-09 DIAGNOSIS — Z905 Acquired absence of kidney: Secondary | ICD-10-CM | POA: Diagnosis not present

## 2019-06-09 LAB — BASIC METABOLIC PANEL
BUN: 30 — AB (ref 4–21)
CO2: 28 — AB (ref 13–22)
Chloride: 94 — AB (ref 99–108)
Creatinine: 1.6 — AB (ref 0.5–1.1)
Glucose: 97
Potassium: 4.1 (ref 3.4–5.3)
Sodium: 138 (ref 137–147)

## 2019-06-09 LAB — COMPREHENSIVE METABOLIC PANEL
Calcium: 9 (ref 8.7–10.7)
GFR calc Af Amer: 35
GFR calc non Af Amer: 30

## 2019-06-09 MED ORDER — SODIUM CHLORIDE 0.9 % IV SOLN
510.0000 mg | INTRAVENOUS | Status: DC
  Administered 2019-06-09: 510 mg via INTRAVENOUS
  Filled 2019-06-09: qty 17

## 2019-06-09 NOTE — Discharge Instructions (Signed)

## 2019-06-14 ENCOUNTER — Other Ambulatory Visit: Payer: Self-pay

## 2019-06-14 ENCOUNTER — Encounter (HOSPITAL_COMMUNITY): Payer: Self-pay

## 2019-06-14 ENCOUNTER — Ambulatory Visit (HOSPITAL_COMMUNITY)
Admission: RE | Admit: 2019-06-14 | Discharge: 2019-06-14 | Disposition: A | Discharge status: Home / Self Care | Payer: PPO | Source: Ambulatory Visit | Attending: Adult Health | Admitting: Adult Health

## 2019-06-14 VITALS — BP 134/63 | HR 74 | Wt 168.4 lb

## 2019-06-14 DIAGNOSIS — Z8249 Family history of ischemic heart disease and other diseases of the circulatory system: Secondary | ICD-10-CM | POA: Diagnosis not present

## 2019-06-14 DIAGNOSIS — I4821 Permanent atrial fibrillation: Secondary | ICD-10-CM | POA: Diagnosis not present

## 2019-06-14 DIAGNOSIS — Z7901 Long term (current) use of anticoagulants: Secondary | ICD-10-CM | POA: Diagnosis not present

## 2019-06-14 DIAGNOSIS — N183 Chronic kidney disease, stage 3 unspecified: Secondary | ICD-10-CM | POA: Diagnosis not present

## 2019-06-14 DIAGNOSIS — J449 Chronic obstructive pulmonary disease, unspecified: Secondary | ICD-10-CM | POA: Insufficient documentation

## 2019-06-14 DIAGNOSIS — Z87891 Personal history of nicotine dependence: Secondary | ICD-10-CM | POA: Insufficient documentation

## 2019-06-14 DIAGNOSIS — E611 Iron deficiency: Secondary | ICD-10-CM | POA: Diagnosis not present

## 2019-06-14 DIAGNOSIS — I5032 Chronic diastolic (congestive) heart failure: Secondary | ICD-10-CM | POA: Insufficient documentation

## 2019-06-14 DIAGNOSIS — Z95810 Presence of automatic (implantable) cardiac defibrillator: Secondary | ICD-10-CM | POA: Diagnosis not present

## 2019-06-14 DIAGNOSIS — R0602 Shortness of breath: Secondary | ICD-10-CM | POA: Diagnosis not present

## 2019-06-14 DIAGNOSIS — J9601 Acute respiratory failure with hypoxia: Secondary | ICD-10-CM | POA: Insufficient documentation

## 2019-06-14 DIAGNOSIS — E785 Hyperlipidemia, unspecified: Secondary | ICD-10-CM | POA: Insufficient documentation

## 2019-06-14 DIAGNOSIS — I130 Hypertensive heart and chronic kidney disease with heart failure and stage 1 through stage 4 chronic kidney disease, or unspecified chronic kidney disease: Secondary | ICD-10-CM | POA: Diagnosis not present

## 2019-06-14 DIAGNOSIS — I272 Pulmonary hypertension, unspecified: Secondary | ICD-10-CM | POA: Diagnosis not present

## 2019-06-14 DIAGNOSIS — Z79899 Other long term (current) drug therapy: Secondary | ICD-10-CM | POA: Insufficient documentation

## 2019-06-14 DIAGNOSIS — D509 Iron deficiency anemia, unspecified: Secondary | ICD-10-CM | POA: Diagnosis not present

## 2019-06-14 DIAGNOSIS — D649 Anemia, unspecified: Secondary | ICD-10-CM

## 2019-06-14 DIAGNOSIS — I428 Other cardiomyopathies: Secondary | ICD-10-CM | POA: Insufficient documentation

## 2019-06-14 DIAGNOSIS — Z85528 Personal history of other malignant neoplasm of kidney: Secondary | ICD-10-CM | POA: Diagnosis not present

## 2019-06-14 DIAGNOSIS — I2510 Atherosclerotic heart disease of native coronary artery without angina pectoris: Secondary | ICD-10-CM | POA: Insufficient documentation

## 2019-06-14 MED ORDER — TORSEMIDE 100 MG PO TABS: 100.0000 mg | ORAL_TABLET | Freq: Two times a day (BID) | ORAL | 1 refills | Status: DC

## 2019-06-14 MED ORDER — METOLAZONE 5 MG PO TABS: 5.0000 mg | ORAL_TABLET | ORAL | 3 refills | Status: DC

## 2019-06-14 NOTE — Patient Instructions (Signed)
INCREASE Torsemide to 100mg  two times daily.  INCREASE Metolazone to every Tuesday, Thursday, Saturday.  Please follow up with the [facility] in 7 days.  At the [facility], you and your health needs are our priority. As part of our continuing mission to provide you with exceptional heart care, we have created designated Provider Care Teams. These Care Teams include your primary Cardiologist (physician) and Advanced Practice Providers (APPs- Physician Assistants and Nurse Practitioners) who all work together to provide you with the care you need, when you need it.   You may see any of the following providers on your designated Care Team at your next follow up: Marland Kitchen Dr Glori Bickers . Dr Loralie Champagne . Darrick Grinder, NP . Lyda Jester, PA . Audry Riles, PharmD   Please be sure to bring in all your medications bottles to every appointment.

## 2019-06-14 NOTE — Progress Notes (Signed)
ReDS Vest / Clip - 06/14/19 1400      ReDS Vest / Clip   Station Marker  D    Ruler Value  28    ReDS Value Range  (!) High volume overload    ReDS Actual Value  44    Anatomical Comments  sitting

## 2019-06-14 NOTE — Progress Notes (Signed)
PCP: Elby Beck, FNP Cardiology: Dr. Rockey Situ HF Cardiology: Dr. [name] Ortho: Dr Sharol Given   [name] is a 86 year old with a history of permanent atrial fibrillation, chronic diastolic CHF, CKD stage 3, smoking/prior COPD, renal cell CA s/p right nephrectomy.  She has a prior history of HFrEF with nonischemic cardiomyoapthy and had a Medtronic ICD placed.  However, subsequently her EF has recovered.  More recently, she has had symptomatic HFpEF.    She has had dyspnea and edema for "a long time," but it has been worse since January of this year. In 6/20, she had left hip ORIF after mechanical fall.  Echo in 6/20 showed EF 60-65%, mild RV dilation with normal systolic function, PASP 93 mmHg.  After she got home from the hospital stay post-ORIF, she noted marked peripheral edema.  She was short of breath walking short distances.  +orthopnea.  She came back to the hospital on 11/04/18 and was admitted with volume overload.  She was diuresed aggressively and lost about 22 lbs.  RHC showed pulmonary venous hypertension.  PCWP tracing had prominent v-waves in absence of significant MR, suggesting significant diastolic dysfunction.   She has been noted to have significant Fe deficiency anemia, but FOBT was negative during 7/20 hospitalization.   She was admitted in 11/20 with symptomatic anemia, GI workup concerning for ischemic bowel.  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 Doren Custard with VVS and conservative management recommended.  Capsule endoscopy showed no definite source of bleeding.  She was volume overloaded due to significant RV dysfunction and diuresed, she also developed AKI on CKD stage 3.      Today she returns for HF follow up. Overall feeling fair. SOB with exertion.+Orthopnea. Ongoing leg edema.  Denies PND/Orthopnea. Appetite ok.Eating soup and drinking lots of water.  No fever or chills. Weight at home 169-170  pounds. Taking all medications.  Grandson lives  with her.   Labs (7/20): K 3.7, creatinine 1.44 => 1.33 Labs (12/20): pro-BNP 431, K 4.4, creatinine 1.24, hgb 8.2 Labs (06/02/18): Iron Sat 5%  PMH: 1. COPD 2. Renal cell carcinoma: S/p right kidney resection.  3. CKD stage 3 4. Fe deficiency anemia 5. Atrial fibrillation: Permanent. She has failed amiodarone, Tikosyn, and flecainide as well as multiple cardioversions. 6. Chronic diastolic CHF: She has history of prior HFrEF with nonischemic cardiomyopathy.  She has a Medtronic ICD.   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  - More recently, EF has been in normal range but she has had diastolic CHF.  - Echo (5/88): EF 60-65%, mildly dilated RV with PASP 93 mmHg, mild-moderate TR.  - RHC (7/20): mean RA 11, PA 64/18 mean 39, mean PCWP 24 with v waves to 41, CI 4.21, PVR 1.9 WU.  - Cardiomems placement - RHC (11/20): mean RA 13, PA 63/23 mean 25 with v waves to 47 (MR only mild by echo), mean PCWP 25, CI 3.18, PVR 2.6 WU 7. Hyperlipidemia 8. Carotid stenosis: Carotid dopplers (5/02) with 77-41% LICA stenosis.   9. Possible ischemic bowel in 11/20 10. 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 Conservative management per Dr. Doren Custard.   Social History   Socioeconomic History  . Marital status: Widowed    Spouse name: Not on file  . Number of children: Not on file  . Years of education: Not on file  . Highest education level: Not on file  Occupational History  . Occupation: Retired  Employer: RETIRED  Tobacco Use  . Smoking status: Former Smoker    Packs/day: 0.50    Years: 40.00    Pack years: 20.00    Types: Cigarettes    Quit date: 05/06/2001    Years since quitting: 18.1  . Smokeless tobacco: Never Used  Substance and Sexual Activity  . Alcohol use: No    Alcohol/week: 0.0 standard drinks  . Drug use: No  . Sexual activity: Never  Other Topics Concern  . Not on file  Social History Narrative   WIDOW   CHILDREN AND GRANDCHILDREN ALL  LIVE CLOSE BY BUT SHE LIVES ALONE.   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   ALCOHOL USE-NO   RETIRED   FORMER TOBACCO USE.....1/2 PACK X 36 YRS UNTIL 2003   Would desire CPR   Social Determinants of Health   Financial Resource Strain:   . Difficulty of Paying Living Expenses: Not on file  Food Insecurity:   . Worried About Charity fundraiser in the Last Year: Not on file  . Ran Out of Food in the Last Year: Not on file  Transportation Needs:   . Lack of Transportation (Medical): Not on file  . Lack of Transportation (Non-Medical): Not on file  Physical Activity:   . Days of Exercise per Week: Not on file  . Minutes of Exercise per Session: Not on file  Stress:   . Feeling of Stress : Not on file  Social Connections:   . Frequency of Communication with Friends and Family: Not on file  . Frequency of Social Gatherings with Friends and Family: Not on file  . Attends Religious Services: Not on file  . Active Member of Clubs or Organizations: Not on file  . Attends Archivist Meetings: Not on file  . Marital Status: Not on file  Intimate Partner Violence:   . Fear of Current or Ex-Partner: Not on file  . Emotionally Abused: Not on file  . Physically Abused: Not on file  . Sexually Abused: Not on file   Family History  Problem Relation Age of Onset  . Heart failure Mother   . Breast cancer Maternal Aunt 82  . Breast cancer Cousin   . Breast cancer Other   . Colon cancer Neg Hx    ROS: All systems reviewed and negative except as per HPI.   Current Outpatient Medications  Medication Sig Dispense Refill  . acetaminophen (TYLENOL) 500 MG tablet Take 500-1,000 mg by mouth every 8 (eight) hours as needed for mild pain or headache.     . bisacodyl (DULCOLAX) 5 MG EC tablet Take 1 tablet (5 mg total) by mouth daily as needed for moderate constipation. 10 tablet 0  . cetirizine (ZYRTEC) 10 MG tablet Take 10 mg by mouth  daily.     Marland Kitchen conjugated estrogens (PREMARIN) vaginal cream Place 1 Applicatorful vaginally daily. 42.5 g 12  . Cranberry (AZO CRANBERRY GUMMIES) 500 MG CHEW Chew 2,000 mg by mouth daily.    Marland Kitchen ezetimibe (ZETIA) 10 MG tablet Take 1 tablet (10 mg total) by mouth daily. 30 tablet 11  . ferrous sulfate 325 (65 FE) MG tablet Take 1 tablet (325 mg total) by mouth 2 (two) times daily with a meal. 60 tablet 0  . fluticasone (FLONASE) 50 MCG/ACT nasal spray PLACE 2 SPRAYS INTO BOTH NOSTRILS ONCE DAILY AS DIRECTED 16 g 6  . hydrocortisone 2.5 % cream Apply  1 application topically daily as needed (to itchy areas).     Marland Kitchen levothyroxine (SYNTHROID) 25 MCG tablet TAKE 1 TABLET BY MOUTH ONCE DAILY BEFOREBREAKFAST 30 tablet 3  . magnesium oxide (MAG-OX) 400 MG tablet Take 1 tablet (400 mg total) by mouth daily. 30 tablet 2  . metolazone (ZAROXOLYN) 5 MG tablet Take 1 tablet (5 mg total) by mouth 2 (two) times a week. Every Tue and Thursday 10 tablet 3  . potassium chloride SA (KLOR-CON) 20 MEQ tablet Take 2 tablets (40 mEq total) by mouth 2 (two) times daily. Take 1 extra tab on Tue and Thur with metolazone 135 tablet 0  . rosuvastatin (CRESTOR) 40 MG tablet Take 1 tablet (40 mg total) by mouth daily. 90 tablet 3  . torsemide (DEMADEX) 20 MG tablet Take 4 tablets (80 mg total) by mouth 2 (two) times daily. 60 tablet 0  . vitamin C (ASCORBIC ACID) 250 MG tablet Take 500 mg by mouth daily.    Marland Kitchen warfarin (COUMADIN) 2 MG tablet Take 1 tablet (2 mg total) by mouth one time only at 6 PM for 1 dose. Directions for dosing per coumadin clinic. 90 tablet 1   No current facility-administered medications for this encounter.   BP 134/63   Pulse 74   Wt 76.4 kg (168 lb 6.4 oz)   SpO2 95%   BMI 27.18 kg/m   Wt Readings from Last 3 Encounters:  06/14/19 76.4 kg (168 lb 6.4 oz)  06/09/19 76.2 kg (168 lb)  06/03/19 81.7 kg (180 lb 3.2 oz)   [place] Clip 44%   General: Appears chronically ill. SOB with exertion. Arrived in  the wheelchair.  HEENT: normal Neck: supple. JVP 11-12. Carotids 2+ bilat; no bruits. No lymphadenopathy or thryomegaly appreciated. Cor: PMI nondisplaced. Regular rate & rhythm. No rubs, gallops or murmurs. Lungs: clear Abdomen: soft, nontender, nondistended. No hepatosplenomegaly. No bruits or masses. Good bowel sounds. Extremities: no cyanosis, clubbing, rash, R and LLE compression wraps. R and LLE 2+ edema above wraps.  Neuro: alert & orientedx3, cranial nerves grossly intact. moves all 4 extremities w/o difficulty. Affect pleasant  Assessment/Plan: 1. Chronic diastolic CHF: Echo in 1/95 showed EF 60-65%, mild RV dilation with normal systolic function, PASP 93 mmHg. She had remote nonischemic cardiomyopathy with recovery of EF, has Medtronic ICD. I suspect that there is significant RV dysfunction. Atrial fibrillation likely contributes, but this appears to be permanent now as she has failed multiple anti-arrhythmics and is reasonably rate-controlled. [place] 11/20 showed elevated filling pressures and pulmonary venous hypertension.  Prominent v-waves on PCWP tracing likely due to stiff ventricle/diastolic dysfunction as she had only mild MR on recent echo.   She is volume overloaded on exam.Reds Clip 44%.  Most recent cardiomems reading was 29 mm hg. Goal for Cardiomems 23 - Increase torsemide 100 mg twice a day.  - Increase metolazone to Tues-Thur- Sat - Check BMET next visit.  2. Pulmonary hypertension: PA systolic pressure estimated 93 mmHg on echo. Based on [place] 11/20, she has primarily pulmonary venous hypertension.  3. CKD: Stage 3. BMET today.  4. Anemia: Fe deficiency. She denies overt bleeding.  Capsule endoscopy during 11/20 admission did not show bleeding source.  -Iron Sats 5%. Received 1st dose of feraheme.  - She has been set up for 2nd dose.  5. Atrial fibrillation: Permanent now. Rate controlled. She has failed amiodarone, Tikosyn, and flecainide as well as multiple  cardioversions.  - Continue warfarin.  6.Acute Hypoxic Respiratory Failure -  I personally ambulated her in the clinic to monitor oxygen saturations.  -O2 sats at rest 92% on room air -O2 sats with exertion on room air 84% - O2 saturations up ot 92% on 2 liters oxygen with exertion.  -Home oxygen ordered.   Follow up in 7 days to reassess volume. Check [place] clip and BMET at that time. We will continue to follow Cardiomems readings.    [name] Bovard  NP-C  06/14/2019

## 2019-06-15 ENCOUNTER — Other Ambulatory Visit (HOSPITAL_COMMUNITY): Payer: Self-pay

## 2019-06-15 DIAGNOSIS — I5032 Chronic diastolic (congestive) heart failure: Secondary | ICD-10-CM

## 2019-06-15 DIAGNOSIS — I272 Pulmonary hypertension, unspecified: Secondary | ICD-10-CM

## 2019-06-15 NOTE — Progress Notes (Signed)
Revised order placed for oxygen for advanced home health.

## 2019-06-16 ENCOUNTER — Encounter (HOSPITAL_COMMUNITY)
Admission: RE | Admit: 2019-06-16 | Discharge: 2019-06-16 | Disposition: A | Discharge status: Home / Self Care | Payer: PPO | Source: Ambulatory Visit | Attending: Cardiology | Admitting: Cardiology

## 2019-06-16 ENCOUNTER — Other Ambulatory Visit: Payer: Self-pay

## 2019-06-16 DIAGNOSIS — E611 Iron deficiency: Secondary | ICD-10-CM

## 2019-06-16 MED ORDER — SODIUM CHLORIDE 0.9 % IV SOLN
510.0000 mg | INTRAVENOUS | Status: DC
  Administered 2019-06-16: 510 mg via INTRAVENOUS
  Filled 2019-06-16: qty 17

## 2019-06-21 ENCOUNTER — Ambulatory Visit (HOSPITAL_COMMUNITY)
Admission: RE | Admit: 2019-06-21 | Discharge: 2019-06-21 | Disposition: A | Discharge status: Home / Self Care | Payer: PPO | Source: Ambulatory Visit | Attending: Cardiology | Admitting: Cardiology

## 2019-06-21 ENCOUNTER — Other Ambulatory Visit: Payer: Self-pay

## 2019-06-21 ENCOUNTER — Encounter (HOSPITAL_COMMUNITY): Payer: Self-pay

## 2019-06-21 VITALS — BP 142/60 | HR 64 | Wt 160.4 lb

## 2019-06-21 DIAGNOSIS — G35 Multiple sclerosis: Secondary | ICD-10-CM | POA: Insufficient documentation

## 2019-06-21 DIAGNOSIS — Z95810 Presence of automatic (implantable) cardiac defibrillator: Secondary | ICD-10-CM | POA: Insufficient documentation

## 2019-06-21 DIAGNOSIS — D509 Iron deficiency anemia, unspecified: Secondary | ICD-10-CM | POA: Insufficient documentation

## 2019-06-21 DIAGNOSIS — I130 Hypertensive heart and chronic kidney disease with heart failure and stage 1 through stage 4 chronic kidney disease, or unspecified chronic kidney disease: Secondary | ICD-10-CM | POA: Insufficient documentation

## 2019-06-21 DIAGNOSIS — I272 Pulmonary hypertension, unspecified: Secondary | ICD-10-CM | POA: Insufficient documentation

## 2019-06-21 DIAGNOSIS — Z803 Family history of malignant neoplasm of breast: Secondary | ICD-10-CM | POA: Insufficient documentation

## 2019-06-21 DIAGNOSIS — Z87891 Personal history of nicotine dependence: Secondary | ICD-10-CM | POA: Insufficient documentation

## 2019-06-21 DIAGNOSIS — J449 Chronic obstructive pulmonary disease, unspecified: Secondary | ICD-10-CM | POA: Diagnosis not present

## 2019-06-21 DIAGNOSIS — I428 Other cardiomyopathies: Secondary | ICD-10-CM | POA: Insufficient documentation

## 2019-06-21 DIAGNOSIS — I739 Peripheral vascular disease, unspecified: Secondary | ICD-10-CM | POA: Insufficient documentation

## 2019-06-21 DIAGNOSIS — E785 Hyperlipidemia, unspecified: Secondary | ICD-10-CM | POA: Diagnosis not present

## 2019-06-21 DIAGNOSIS — I4821 Permanent atrial fibrillation: Secondary | ICD-10-CM | POA: Diagnosis not present

## 2019-06-21 DIAGNOSIS — Z79899 Other long term (current) drug therapy: Secondary | ICD-10-CM | POA: Diagnosis not present

## 2019-06-21 DIAGNOSIS — Z85528 Personal history of other malignant neoplasm of kidney: Secondary | ICD-10-CM | POA: Diagnosis not present

## 2019-06-21 DIAGNOSIS — I6522 Occlusion and stenosis of left carotid artery: Secondary | ICD-10-CM | POA: Diagnosis not present

## 2019-06-21 DIAGNOSIS — J9611 Chronic respiratory failure with hypoxia: Secondary | ICD-10-CM | POA: Diagnosis not present

## 2019-06-21 DIAGNOSIS — Z8249 Family history of ischemic heart disease and other diseases of the circulatory system: Secondary | ICD-10-CM | POA: Diagnosis not present

## 2019-06-21 DIAGNOSIS — I2510 Atherosclerotic heart disease of native coronary artery without angina pectoris: Secondary | ICD-10-CM | POA: Insufficient documentation

## 2019-06-21 DIAGNOSIS — I5032 Chronic diastolic (congestive) heart failure: Secondary | ICD-10-CM | POA: Diagnosis not present

## 2019-06-21 DIAGNOSIS — Z7901 Long term (current) use of anticoagulants: Secondary | ICD-10-CM | POA: Insufficient documentation

## 2019-06-21 DIAGNOSIS — I5042 Chronic combined systolic (congestive) and diastolic (congestive) heart failure: Secondary | ICD-10-CM | POA: Insufficient documentation

## 2019-06-21 DIAGNOSIS — Z79890 Hormone replacement therapy (postmenopausal): Secondary | ICD-10-CM | POA: Insufficient documentation

## 2019-06-21 DIAGNOSIS — N183 Chronic kidney disease, stage 3 unspecified: Secondary | ICD-10-CM | POA: Insufficient documentation

## 2019-06-21 LAB — CBC
HCT: 37.3 % (ref 36.0–46.0)
Hemoglobin: 10.2 g/dL — ABNORMAL LOW (ref 12.0–15.0)
MCH: 21.5 pg — ABNORMAL LOW (ref 26.0–34.0)
MCHC: 27.3 g/dL — ABNORMAL LOW (ref 30.0–36.0)
MCV: 78.7 fL — ABNORMAL LOW (ref 80.0–100.0)
Platelets: 233 10*3/uL (ref 150–400)
RBC: 4.74 MIL/uL (ref 3.87–5.11)
RDW: 29 % — ABNORMAL HIGH (ref 11.5–15.5)
WBC: 4.5 10*3/uL (ref 4.0–10.5)
nRBC: 0 % (ref 0.0–0.2)

## 2019-06-21 LAB — BASIC METABOLIC PANEL
Anion gap: 13 (ref 5–15)
BUN: 28 mg/dL — ABNORMAL HIGH (ref 8–23)
CO2: 32 mmol/L (ref 22–32)
Calcium: 8.9 mg/dL (ref 8.9–10.3)
Chloride: 93 mmol/L — ABNORMAL LOW (ref 98–111)
Creatinine, Ser: 1.32 mg/dL — ABNORMAL HIGH (ref 0.44–1.00)
GFR calc Af Amer: 43 mL/min — ABNORMAL LOW (ref >60)
GFR calc non Af Amer: 37 mL/min — ABNORMAL LOW (ref >60)
Glucose, Bld: 100 mg/dL — ABNORMAL HIGH (ref 70–99)
Potassium: 4.4 mmol/L (ref 3.5–5.1)
Sodium: 138 mmol/L (ref 135–145)

## 2019-06-21 NOTE — Patient Instructions (Addendum)
Lab work done today. We will notify you of any abnormal lab work. No news is good news!  Please follow up with the Advanced Heart Failure clinic in 4-6 weeks.  At the [facility], you and your health needs are our priority. As part of our continuing mission to provide you with exceptional heart care, we have created designated Provider Care Teams. These Care Teams include your primary Cardiologist (physician) and Advanced Practice Providers (APPs- Physician Assistants and Nurse Practitioners) who all work together to provide you with the care you need, when you need it.   You may see any of the following providers on your designated Care Team at your next follow up: Marland Kitchen Dr Glori Bickers . Dr Loralie Champagne . Darrick Grinder, NP . Lyda Jester, PA . Audry Riles, PharmD   Please be sure to bring in all your medications bottles to every appointment.

## 2019-06-21 NOTE — Progress Notes (Signed)
PCP: Elby Beck, FNP Cardiology: Dr. Rockey Situ HF Cardiology: Dr. [name] Ortho: Dr Sharol Given   Reason for Visit: f/u for chronic diastolic HF  [name] is a 86 year old with a history of permanent atrial fibrillation, chronic diastolic CHF, CKD stage 3, smoking/prior COPD, renal cell CA s/p right nephrectomy.  She has a prior history of HFrEF with nonischemic cardiomyoapthy and had a Medtronic ICD placed.  However, subsequently her EF has recovered.  More recently, she has had symptomatic HFpEF.    She has had dyspnea and edema for "a long time," but it has been worse since January of this year. In 6/20, she had left hip ORIF after mechanical fall.  Echo in 6/20 showed EF 60-65%, mild RV dilation with normal systolic function, PASP 93 mmHg.  After she got home from the hospital stay post-ORIF, she noted marked peripheral edema.  She was short of breath walking short distances.  +orthopnea.  She came back to the hospital on 11/04/18 and was admitted with volume overload.  She was diuresed aggressively and lost about 22 lbs.  RHC showed pulmonary venous hypertension.  PCWP tracing had prominent v-waves in absence of significant MR, suggesting significant diastolic dysfunction.   She has been noted to have significant Fe deficiency anemia, but FOBT was negative during 7/20 hospitalization.   She was admitted in 11/20 with symptomatic anemia, GI workup concerning for ischemic bowel.  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 Doren Custard with [facility] and conservative management recommended.  Capsule endoscopy showed no definite source of bleeding.  She was volume overloaded due to significant RV dysfunction and diuresed, she also developed AKI on CKD stage 3.     Had recent return visit on 2/8. Overall feeling fair but was SOB with exertion + Orthopnea, w/ ongoing leg edema. Admitted to dietary indiscretion. Eating soup and drinking lots of water.  ReDs clip measurement was elevated at  44%. Diuretics were adjusted. Torsemide was increased to 100 mg bid and metolazone was increased to Tues-Thur- Sat. Walk test was also done in clinic and she was noted to be hypoxic w/ ambulation. Home O2 ordered. She also was set up for feraheme injections and had her second dose last week.    She presents back for volume reassessment and labs. She feels much better.  "The best I have felt in a while".  Her wt is down 8 lb from previous visit, from 168>>160 lb. ReDs clip is still elevated but improving, down from 44>>40%. Admits that she missed a dose of metolazone the other day. Took 2 doses last week instead of 3. Her cardiomems reading this morning however was at her goal, PA diastolic pressure of 23. She continues w/ bilateral LEE and wearing unna boots. Home Health rewraps legs on a weekly basis. She has reduced fluid intake and is watching sodium intake. She admits that she has not been good about elevating legs while at home to help with edema. Compliance w/ cardiomems is better but still not 100% compliant.    Labs (7/20): K 3.7, creatinine 1.44 => 1.33 Labs (12/20): pro-BNP 431, K 4.4, creatinine 1.24, hgb 8.2 Labs (06/02/18): Iron Sat 5%, K 4.4, creatinine 1.65   PMH: 1. COPD 2. Renal cell carcinoma: S/p right kidney resection.  3. CKD stage 3 4. Fe deficiency anemia 5. Atrial fibrillation: Permanent. She has failed amiodarone, Tikosyn, and flecainide as well as multiple cardioversions. 6. Chronic diastolic CHF: She has history of prior HFrEF  with nonischemic cardiomyopathy.  She has a Medtronic ICD.   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  - More recently, EF has been in normal range but she has had diastolic CHF.  - Echo (1/95): EF 60-65%, mildly dilated RV with PASP 93 mmHg, mild-moderate TR.  - RHC (7/20): mean RA 11, PA 64/18 mean 39, mean PCWP 24 with v waves to 41, CI 4.21, PVR 1.9 WU.  - Cardiomems placement - RHC (11/20): mean RA 13, PA 63/23 mean 25 with v waves to 47 (MR  only mild by echo), mean PCWP 25, CI 3.18, PVR 2.6 WU 7. Hyperlipidemia 8. Carotid stenosis: Carotid dopplers (0/93) with 26-71% LICA stenosis.   9. Possible ischemic bowel in 11/20 10. 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 Conservative management per Dr. Doren Custard.   Social History   Socioeconomic History  . Marital status: Widowed    Spouse name: Not on file  . Number of children: Not on file  . Years of education: Not on file  . Highest education level: Not on file  Occupational History  . Occupation: Retired    Fish farm manager: RETIRED  Tobacco Use  . Smoking status: Former Smoker    Packs/day: 0.50    Years: 40.00    Pack years: 20.00    Types: Cigarettes    Quit date: 05/06/2001    Years since quitting: 18.1  . Smokeless tobacco: Never Used  Substance and Sexual Activity  . Alcohol use: No    Alcohol/week: 0.0 standard drinks  . Drug use: No  . Sexual activity: Never  Other Topics Concern  . 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   ALCOHOL USE-NO   RETIRED   FORMER TOBACCO USE.....1/2 PACK X 63 YRS UNTIL 2003   Would desire CPR   Social Determinants of Health   Financial Resource Strain:   . Difficulty of Paying Living Expenses: Not on file  Food Insecurity:   . Worried About Charity fundraiser in the Last Year: Not on file  . Ran Out of Food in the Last Year: Not on file  Transportation Needs:   . Lack of Transportation (Medical): Not on file  . Lack of Transportation (Non-Medical): Not on file  Physical Activity:   . Days of Exercise per Week: Not on file  . Minutes of Exercise per Session: Not on file  Stress:   . Feeling of Stress : Not on file  Social Connections:   . Frequency of Communication with Friends and Family: Not on file  . Frequency of Social  Gatherings with Friends and Family: Not on file  . Attends Religious Services: Not on file  . Active Member of Clubs or Organizations: Not on file  . Attends Archivist Meetings: Not on file  . Marital Status: Not on file  Intimate Partner Violence:   . Fear of Current or Ex-Partner: Not on file  . Emotionally Abused: Not on file  . Physically Abused: Not on file  . Sexually Abused: Not on file   Family History  Problem Relation Age of Onset  . Heart failure Mother   . Breast cancer Maternal Aunt 82  . Breast cancer Cousin   . Breast cancer Other   . Colon cancer Neg  Hx    ROS: All systems reviewed and negative except as per HPI.   Current Outpatient Medications  Medication Sig Dispense Refill  . acetaminophen (TYLENOL) 500 MG tablet Take 500-1,000 mg by mouth every 8 (eight) hours as needed for mild pain or headache.     . conjugated estrogens (PREMARIN) vaginal cream Place 1 Applicatorful vaginally daily. 42.5 g 12  . Cranberry (AZO CRANBERRY GUMMIES) 500 MG CHEW Chew 2,000 mg by mouth daily.    Marland Kitchen ezetimibe (ZETIA) 10 MG tablet Take 1 tablet (10 mg total) by mouth daily. 30 tablet 11  . ferrous sulfate 325 (65 FE) MG tablet Take 1 tablet (325 mg total) by mouth 2 (two) times daily with a meal. 60 tablet 0  . fluticasone (FLONASE) 50 MCG/ACT nasal spray PLACE 2 SPRAYS INTO BOTH NOSTRILS ONCE DAILY AS DIRECTED 16 g 6  . levothyroxine (SYNTHROID) 25 MCG tablet TAKE 1 TABLET BY MOUTH ONCE DAILY BEFOREBREAKFAST 30 tablet 3  . magnesium oxide (MAG-OX) 400 MG tablet Take 1 tablet (400 mg total) by mouth daily. 30 tablet 2  . metolazone (ZAROXOLYN) 5 MG tablet Take 1 tablet (5 mg total) by mouth 3 (three) times a week. Tues, thurs, sat 15 tablet 3  . potassium chloride SA (KLOR-CON) 20 MEQ tablet Take 2 tablets (40 mEq total) by mouth 2 (two) times daily. Take 1 extra tab on Tue and Thur with metolazone 135 tablet 0  . rosuvastatin (CRESTOR) 40 MG tablet Take 1 tablet (40 mg  total) by mouth daily. 90 tablet 3  . torsemide (DEMADEX) 100 MG tablet Take 1 tablet (100 mg total) by mouth 2 (two) times daily. 180 tablet 1  . vitamin C (ASCORBIC ACID) 250 MG tablet Take 500 mg by mouth daily.    . bisacodyl (DULCOLAX) 5 MG EC tablet Take 1 tablet (5 mg total) by mouth daily as needed for moderate constipation. (Patient not taking: Reported on 06/21/2019) 10 tablet 0  . cetirizine (ZYRTEC) 10 MG tablet Take 10 mg by mouth daily.     . hydrocortisone 2.5 % cream Apply 1 application topically daily as needed (to itchy areas).     . warfarin (COUMADIN) 2 MG tablet Take 1 tablet (2 mg total) by mouth one time only at 6 PM for 1 dose. Directions for dosing per coumadin clinic. 90 tablet 1   No current facility-administered medications for this encounter.   BP (!) 142/60   Pulse 64   Wt 72.8 kg (160 lb 6.4 oz)   SpO2 94%   BMI 25.89 kg/m   Wt Readings from Last 3 Encounters:  06/21/19 72.8 kg (160 lb 6.4 oz)  06/16/19 73.5 kg (162 lb)  06/14/19 76.4 kg (168 lb 6.4 oz)   Reds Clip:  44%   PHYSICAL EXAM: General:  Elderly WF Well appearing. No respiratory difficulty HEENT: normal Neck: supple. no JVD. Carotids 2+ bilat; no bruits. No lymphadenopathy or thyromegaly appreciated. Cor: PMI nondisplaced. Irregular rhythm, regular rate. No rubs, gallops or murmurs. Lungs: clear Abdomen: soft, nontender, nondistended. No hepatosplenomegaly. No bruits or masses. Good bowel sounds. Extremities: no cyanosis, clubbing, rash, 1+ bilateral LE edema bilateral unna boots Neuro: alert & oriented x 3, cranial nerves grossly intact. moves all 4 extremities w/o difficulty. Affect pleasant.   Assessment/Plan: 1. Chronic diastolic CHF: Echo in 1/44 showed EF 60-65%, mild RV dilation with normal systolic function, PASP 93 mmHg. She had remote nonischemic cardiomyopathy with recovery of EF, has Medtronic ICD. Suspect that  there is significant RV dysfunction. Atrial fibrillation likely  contributes, but this appears to be permanent now as she has failed multiple anti-arrhythmics and is reasonably rate-controlled. [place] 11/20 showed elevated filling pressures and pulmonary venous hypertension.  Prominent v-waves on PCWP tracing likely due to stiff ventricle/diastolic dysfunction as she had only mild MR on recent echo.   She remains volume overloaded but significantly improved. ReDs clip down from 44>>40%. Wt down 8 lb from last week's visit, from 168>>160 lb. Her cardiomems reading today was at goal, PA systolic pressure 23 mmHg (down from 29 before diuretic change).  - Continue torsemide 100 mg twice a day.  - Continue metolazone Tues-Thur- Sat. She will work on improving compliance and take 3 days a week as prescribed. - Check BMP today  - continue w/ unna boots. Appreciated [place] assistance.  - We discussed elevating legs, above level of heart, to help w/ edema - continue sodium and fluid restriction - she will work on improving daily compliance w/ cardiomems 2. Pulmonary hypertension: PA systolic pressure estimated 93 mmHg on echo. Based on [place] 11/20, she has primarily pulmonary venous hypertension.  - continue diuretics per above  3. CKD: Stage 3. diuretics recently increased, per above.  - check BMP today 4. Anemia: Fe deficiency. She denies overt bleeding.  Capsule endoscopy during 11/20 admission did not show bleeding source.  -Recent Iron Sats 5%. Received 2nd dose of feraheme last week.  - Will repeat iron studies in 4 weeks to determine need for additional feraheme - Continue ferous sulfate 325 mg bid  5. Atrial fibrillation: Permanent now. Rate controlled. She has failed amiodarone, Tikosyn, and flecainide as well as multiple cardioversions.  - Continue warfarin for anticoagulation  6. Chronic Hypoxic Respiratory Failure -  home O2 ordered.    Lyda Jester PA-C  06/21/2019  F/u again in 4 weeks to reassess volume status.

## 2019-06-21 NOTE — Progress Notes (Signed)
I have reviewed this visit and I agree on the patient's plan of dosage and recommendations. Rinaldo Macqueen B Kimyata Milich, FNP   

## 2019-06-22 ENCOUNTER — Telehealth (INDEPENDENT_AMBULATORY_CARE_PROVIDER_SITE_OTHER): Payer: PPO | Admitting: Family Medicine

## 2019-06-22 DIAGNOSIS — Z7901 Long term (current) use of anticoagulants: Secondary | ICD-10-CM

## 2019-06-22 LAB — POCT INR: INR: 1.6 — AB (ref 2.0–3.0)

## 2019-06-22 NOTE — Telephone Encounter (Signed)
Mendel Ryder with [facility] called today to report the patient's INR  She stated it was 1.6    Phone- [phone]

## 2019-06-23 LAB — POCT INR: INR: 1.6 — AB (ref 2.0–3.0)

## 2019-06-23 NOTE — Patient Instructions (Signed)
Pre visit review using our clinic review tool, if applicable. No additional management support is needed unless otherwise documented below in the visit note. 

## 2019-06-23 NOTE — Telephone Encounter (Signed)
Forwarding to you for coumadin management. Thanks.

## 2019-06-23 NOTE — Telephone Encounter (Signed)
This has been documented in an anticoag encounter and sent to provider.

## 2019-06-28 ENCOUNTER — Ambulatory Visit (INDEPENDENT_AMBULATORY_CARE_PROVIDER_SITE_OTHER): Payer: PPO | Admitting: Orthopedic Surgery

## 2019-06-28 ENCOUNTER — Other Ambulatory Visit: Payer: Self-pay

## 2019-06-28 ENCOUNTER — Encounter: Payer: Self-pay | Admitting: Orthopedic Surgery

## 2019-06-28 VITALS — Ht 66.0 in | Wt 160.0 lb

## 2019-06-28 DIAGNOSIS — I87312 Chronic venous hypertension (idiopathic) with ulcer of left lower extremity: Secondary | ICD-10-CM

## 2019-06-28 DIAGNOSIS — L97909 Non-pressure chronic ulcer of unspecified part of unspecified lower leg with unspecified severity: Secondary | ICD-10-CM | POA: Diagnosis not present

## 2019-06-28 DIAGNOSIS — I87311 Chronic venous hypertension (idiopathic) with ulcer of right lower extremity: Secondary | ICD-10-CM

## 2019-06-28 DIAGNOSIS — I87319 Chronic venous hypertension (idiopathic) with ulcer of unspecified lower extremity: Secondary | ICD-10-CM

## 2019-06-28 MED ORDER — DOXYCYCLINE HYCLATE 100 MG PO TABS: 100.0000 mg | ORAL_TABLET | Freq: Two times a day (BID) | ORAL | 0 refills | Status: DC

## 2019-06-29 ENCOUNTER — Encounter: Payer: Self-pay | Admitting: Orthopedic Surgery

## 2019-06-29 NOTE — Progress Notes (Signed)
Office Visit Note   Patient: [name]           Date of Birth: 1933/08/17           MRN: [id number] Visit Date: 06/28/2019              Requested by: Elby Beck, [address],  [place] [id number] PCP: Elby Beck, FNP  Chief Complaint  Patient presents with  . Left Leg - Follow-up  . Right Leg - Follow-up      HPI: Patient is a 86 year old woman who presents in follow-up for venous ulceration bilateral lower extremities.  Patient is having [facility] with [facility] change the dressing once a week at home.  Patient removed the dressing on the left leg stating the dressing was too tight.  Patient states she sits with her legs dependent complains of increased redness swelling and blistering on the left leg.   Assessment & Plan: Visit Diagnoses:  1. Chronic venous hypertension w ulceration (HCC)     Plan: Discussed the importance of elevation moving the ankle and toes to work her calf pump.  She is given a prescription for doxycycline.  Patient agreed for both legs to be wrapped.    Follow-Up Instructions: Return in about 2 weeks (around 07/12/2019).   Ortho Exam  Patient is alert, oriented, no adenopathy, well-dressed, normal affect, normal respiratory effort. Examination patient has good wrinkling of the skin on the right leg with improved ulceration there is brawny skin color changes but no cellulitis.  Left leg has increased swelling there is redness there is tenderness to palpation she has new blisters from the swelling dorsally over the ankle.  Patient states that she has been allowing her legs to be dependent without elevation.  Imaging: No results found. No images are attached to the encounter.  Labs: Lab Results  Component Value Date   HGBA1C 6.1 (H) 08/17/2014   HGBA1C 5.6 01/14/2014   ESRSEDRATE 2 02/10/2012   REPTSTATUS 11/06/2018 FINAL 11/04/2018   GRAMSTAIN NO WBC SEEN NO ORGANISMS SEEN  02/21/2016   CULT (A)  11/04/2018    <10,000 COLONIES/mL INSIGNIFICANT GROWTH Performed at [facility], [place] [address], [place], [place] [address]    LABORGA ESCHERICHIA COLI 12/24/2016     Lab Results  Component Value Date   ALBUMIN 3.7 05/06/2019   ALBUMIN 3.0 (L) 03/03/2019   ALBUMIN 3.0 (L) 03/02/2019    Lab Results  Component Value Date   MG 2.1 03/10/2019   MG 2.1 03/08/2019   MG 2.4 03/05/2019   No results found for: VD25OH  No results found for: PREALBUMIN CBC EXTENDED Latest Ref Rng & Units 06/21/2019 06/03/2019 05/06/2019  WBC 4.0 - 10.5 K/uL 4.5 3.8(L) 3.6(L)  RBC 3.87 - 5.11 MIL/uL 4.74 4.03 3.74(L)  HGB 12.0 - 15.0 g/dL 10.2(L) 8.2(L) 8.2 Repeated and verified X2.(L)  HCT 36.0 - 46.0 % 37.3 29.4(L) 27.5(L)  PLT 150 - 400 K/uL 233 266 270.0  NEUTROABS 1.4 - 7.7 K/uL - - 2.5  LYMPHSABS 0.7 - 4.0 K/uL - - 0.5(L)     Body mass index is 25.82 kg/m.  Orders:  No orders of the defined types were placed in this encounter.  Meds ordered this encounter  Medications  . doxycycline (VIBRA-TABS) 100 MG tablet    Sig: Take 1 tablet (100 mg total) by mouth 2 (two) times daily.    Dispense:  60 tablet  Refill:  0     Procedures: No procedures performed  Clinical Data: No additional findings.  ROS:  All other systems negative, except as noted in the HPI. Review of Systems  Objective: Vital Signs: Ht 5\' 6"  (1.676 m)   Wt 160 lb (72.6 kg)   BMI 25.82 kg/m   Specialty Comments:  No specialty comments available.  PMFS History: Patient Active Problem List   Diagnosis Date Noted  . Chronic venous hypertension w ulceration ([place])   . Melena   . AKI (acute kidney injury) ([place]) 03/03/2019  . Symptomatic anemia 02/25/2019  . Acute on chronic diastolic CHF (congestive heart failure), NYHA class 4 ([place]) 11/04/2018  . Acute blood loss anemia 11/04/2018  . Chronic bilateral pleural effusions 11/04/2018  . Shortness of breath 10/21/2018  . Displaced fracture of left  femoral neck ([place]) 10/09/2018  . Somnolence   . Closed left hip fracture ([place]) 10/07/2018  . Fall   . Hypokalemia   . Afib ([place]) 06/01/2018  . Urinary frequency 09/18/2017  . Iliac artery stenosis, bilateral ([place]) 07/14/2017  . Recurrent UTI 07/14/2017  . Long term (current) use of anticoagulants 05/08/2017  . Anemia 11/18/2016  . Dysuria 11/18/2016  . Pulmonary hypertension ([place]) 12/04/2015  . B12 deficiency 05/22/2015  . PAD (peripheral artery disease) ([place])   . Elevated serum creatinine   . Renal cell carcinoma ([place])   . S/p nephrectomy   . TIA (transient ischemic attack) 08/16/2014  . Weakness 01/14/2014  . Chronic kidney disease (CKD), stage IV (severe) ([place]) 12/08/2013  . Preoperative cardiovascular examination 06/07/2013  . Chronic diastolic CHF (congestive heart failure) ([place]) 06/04/2013  . Mitral valve regurgitation 06/04/2013  . Allergic rhinitis 01/01/2013  . Chronic cholecystitis with calculus 09/15/2012  . Claudication ([place]) 01/21/2012  . Dizziness 11/26/2011  . Hypertension 05/20/2011  . Avascular necrosis of hip ([place]) 05/03/2011  . OTHER AND UNSPECIFIED COAGULATION DEFECTS 10/13/2009  . RECTAL BLEEDING 10/13/2009  . HOCM / IHSS 07/14/2009  . EDEMA 07/14/2009  . Hyperlipidemia 04/10/2009  . ADJUSTMENT DISORDER WITH ANXIETY 04/10/2009  . INCONTINENCE, URGE 04/10/2009  . Permanent atrial fibrillation 10/27/2008  . SINUS BRADYCARDIA 10/27/2008  . Carotid artery stenosis 10/27/2008  . COPD (chronic obstructive pulmonary disease) ([place]) 10/27/2008  . ICD (implantable cardioverter-defibrillator) in place 10/27/2008   Past Medical History:  Diagnosis Date  . Adjustment disorder with anxiety   . Anginal pain ([place])   . Arthritis    "some in my hands" (11/11/2012)  . Atrial fibrillation ([place])   . Automatic implantable cardioverter-defibrillator in situ   . Blood in stool   . Carotid artery stenosis 09/2007   a. 09/2007: 60-79% bilateral (stable); b. 10/2008: 40-59%  R 60-79%   . Chest pain, unspecified   . Chronic airway obstruction, not elsewhere classified   . Chronic bronchitis ([place])    "get it some; not q year" (11/11/2012)  . Chronic diastolic CHF (congestive heart failure) ([place]) 06/04/2013  . Chronic kidney disease, unspecified   . Coronary artery disease    non-obstructive by 2006 cath  . Frequent UTI    "get them a couple times/yr" (11/11/2012)  . Hemorrhage of rectum and anus   . High cholesterol   . History of blood transfusion 04/2011   "after hip OR" (11/11/2012)  . Hypertension 05/20/2011  . Hypertr obst cardiomyop   . Hypotension, unspecified    cardiac cath 2006..nonobstructive CAD 30-40s lesions.Marland KitchenETT 1/09 nondiagnostic due to poor HR response..Right Renal Cancer 2003  . Long  term (current) use of anticoagulants   . Malignant neoplasm of kidney, except pelvis   . Osteoarthritis of right hip   . Other and unspecified coagulation defects   . PONV (postoperative nausea and vomiting)   . Renal cancer ([place]) 06/2001   Right  . Secondary cardiomyopathy, unspecified   . Sinus bradycardia   . Urge incontinence     Family History  Problem Relation Age of Onset  . Heart failure Mother   . Breast cancer Maternal Aunt 82  . Breast cancer Cousin   . Breast cancer Other   . Colon cancer Neg Hx     Past Surgical History:  Procedure Laterality Date  . ABDOMINAL HYSTERECTOMY  1975   for benign causes  . APPENDECTOMY    . BI-VENTRICULAR PACEMAKER UPGRADE  05/04/2010  . BIOPSY  02/28/2019   Procedure: BIOPSY;  Surgeon: Milus Banister, MD;  Location[facility] ENDOSCOPY;  Service: Endoscopy;;  . CARDIAC CATHETERIZATION  2006  . CARDIOVERSION N/A 02/20/2018   Procedure: CARDIOVERSION;  Surgeon: Minna Merritts, MD;  Location: [place] ORS;  Service: Cardiovascular;  Laterality: N/A;  . CARDIOVERSION N/A 03/27/2018   Procedure: CARDIOVERSION;  Surgeon: Minna Merritts, MD;  Location: [facility] ORS;  Service: Cardiovascular;  Laterality: N/A;  . CATARACT  EXTRACTION W/ INTRAOCULAR LENS  IMPLANT, BILATERAL  01/2006-02-2006  . CHOLECYSTECTOMY N/A 11/11/2012   Procedure: LAPAROSCOPIC CHOLECYSTECTOMY WITH INTRAOPERATIVE CHOLANGIOGRAM;  Surgeon: Imogene Burn. Georgette Dover, MD;  Location: [place];  Service: General;  Laterality: N/A;  . COLONOSCOPY WITH PROPOFOL N/A 02/28/2019   Procedure: COLONOSCOPY WITH PROPOFOL;  Surgeon: Milus Banister, MD;  Location: [facility] ENDOSCOPY;  Service: Endoscopy;  Laterality: N/A;  . EP IMPLANTABLE DEVICE N/A 02/21/2016   Procedure: ICD Generator Changeout;  Surgeon: Deboraha Sprang, MD;  Location: [place] CV LAB;  Service: Cardiovascular;  Laterality: N/A;  . ESOPHAGOGASTRODUODENOSCOPY (EGD) WITH PROPOFOL N/A 02/28/2019   Procedure: ESOPHAGOGASTRODUODENOSCOPY (EGD) WITH PROPOFOL;  Surgeon: Milus Banister, MD;  Location[facility] ENDOSCOPY;  Service: Endoscopy;  Laterality: N/A;  . GIVENS CAPSULE STUDY N/A 03/15/2019   Procedure: GIVENS CAPSULE STUDY;  Surgeon: Thornton Park, MD;  Location: [place];  Service: Gastroenterology;  Laterality: N/A;  . INSERT / REPLACE / REMOVE PACEMAKER  05-01-11   02-28-05-/05-04-10-ICD-MEDTRONIC MAXIMAL DR  . JOINT REPLACEMENT    . LAPAROSCOPIC CHOLECYSTECTOMY  11/11/2012  . LAPAROSCOPIC LYSIS OF ADHESIONS N/A 11/11/2012   Procedure: LAPAROSCOPIC LYSIS OF ADHESIONS;  Surgeon: Imogene Burn. Georgette Dover, MD;  Location: [place];  Service: General;  Laterality: N/A;  . NEPHRECTOMY Right 06/2001    S/P RENAL CELL CANCER  . PRESSURE SENSOR/CARDIOMEMS N/A 02/03/2019   Procedure: PRESSURE SENSOR/CARDIOMEMS;  Surgeon: Larey Dresser, MD;  Location: [place] CV LAB;  Service: Cardiovascular;  Laterality: N/A;  . RIGHT HEART CATH N/A 11/09/2018   Procedure: RIGHT HEART CATH;  Surgeon: Larey Dresser, MD;  Location: [place] CV LAB;  Service: Cardiovascular;  Laterality: N/A;  . RIGHT HEART CATH N/A 03/08/2019   Procedure: RIGHT HEART CATH;  Surgeon: Larey Dresser, MD;  Location: [place] CV LAB;  Service:  Cardiovascular;  Laterality: N/A;  . SUBMUCOSAL TATTOO INJECTION  02/28/2019   Procedure: SUBMUCOSAL TATTOO INJECTION;  Surgeon: Milus Banister, MD;  Location: [facility] ENDOSCOPY;  Service: Endoscopy;;  . TOTAL HIP ARTHROPLASTY Right 05/03/2011   Procedure: TOTAL HIP ARTHROPLASTY ANTERIOR APPROACH;  Surgeon: Mcarthur Rossetti;  Location: [facility] ORS;  Service: Orthopedics;  Laterality: Right;  Removal of Cannulated  Screws Right Hip, Right Direct Anterior Hip Replacement  . TOTAL HIP ARTHROPLASTY Left 10/09/2018   Procedure: TOTAL HIP ARTHROPLASTY ANTERIOR APPROACH;  Surgeon: Rod Can, MD;  Location: [place];  Service: Orthopedics;  Laterality: Left;   Social History   Occupational History  . Occupation: Retired    Fish farm manager: RETIRED  Tobacco Use  . Smoking status: Former Smoker    Packs/day: 0.50    Years: 40.00    Pack years: 20.00    Types: Cigarettes    Quit date: 05/06/2001    Years since quitting: 18.1  . Smokeless tobacco: Never Used  Substance and Sexual Activity  . Alcohol use: No    Alcohol/week: 0.0 standard drinks  . Drug use: No  . Sexual activity: Never

## 2019-06-30 ENCOUNTER — Ambulatory Visit (INDEPENDENT_AMBULATORY_CARE_PROVIDER_SITE_OTHER): Payer: PPO

## 2019-06-30 DIAGNOSIS — Z7901 Long term (current) use of anticoagulants: Secondary | ICD-10-CM

## 2019-06-30 NOTE — Telephone Encounter (Signed)
[place] with [facility] left v/m that on 06/22/19 the INR  Was 1.6 and [facility] request cb with when pt needs to have a repeat INR.

## 2019-06-30 NOTE — Telephone Encounter (Signed)
Advised to retest on 3/2. [name] verbalized understanding.

## 2019-06-30 NOTE — Patient Instructions (Addendum)
A user error has taken place: encounter opened in error, closed for administrative reasons.

## 2019-06-30 NOTE — Progress Notes (Signed)
A user error has taken place: encounter opened in error, closed for administrative reasons.

## 2019-07-01 ENCOUNTER — Telehealth: Payer: Self-pay | Admitting: Family Medicine

## 2019-07-01 NOTE — Chronic Care Management (AMB) (Signed)
  Chronic Care Management   Note  07/01/2019 Name: [name] MRN: [id number] DOB: [birth year]  [name] is a 86 y.o. year old female who is a primary care patient of Elby Beck, FNP. I reached out to [name] by phone today in response to a referral sent by Ms. Zwolle PCP, Elby Beck, FNP.   Ms. Majer was given information about Chronic Care Management services today including:  1. CCM service includes personalized support from designated clinical staff supervised by her physician, including individualized plan of care and coordination with other care providers 2. 24/7 contact phone numbers for assistance for urgent and routine care needs. 3. Service will only be billed when office clinical staff spend 20 minutes or more in a month to coordinate care. 4. Only one practitioner may furnish and bill the service in a calendar month. 5. The patient may stop CCM services at any time (effective at the end of the month) by phone call to the office staff. 6. The patient will be responsible for cost sharing (co-pay) of up to 20% of the service fee (after annual deductible is met).  Patient agreed to services and verbal consent obtained.   Follow up plan:   Raynicia Dukes [employer] Scheduler

## 2019-07-06 ENCOUNTER — Telehealth: Payer: Self-pay

## 2019-07-06 NOTE — Telephone Encounter (Signed)
Lilia Pro, RN with [facility], left a message on Triage VM that the patient has asked to delay her PT/INR until tomorrow. Wanted to let us know there would be a delay in the results due to that.  FYI to Randall An, RN

## 2019-07-07 ENCOUNTER — Ambulatory Visit (INDEPENDENT_AMBULATORY_CARE_PROVIDER_SITE_OTHER): Payer: PPO

## 2019-07-07 DIAGNOSIS — I5033 Acute on chronic diastolic (congestive) heart failure: Secondary | ICD-10-CM | POA: Diagnosis not present

## 2019-07-07 DIAGNOSIS — Z7901 Long term (current) use of anticoagulants: Secondary | ICD-10-CM

## 2019-07-07 DIAGNOSIS — J449 Chronic obstructive pulmonary disease, unspecified: Secondary | ICD-10-CM | POA: Diagnosis not present

## 2019-07-07 DIAGNOSIS — I4821 Permanent atrial fibrillation: Secondary | ICD-10-CM | POA: Diagnosis not present

## 2019-07-07 DIAGNOSIS — D649 Anemia, unspecified: Secondary | ICD-10-CM | POA: Diagnosis not present

## 2019-07-07 DIAGNOSIS — Z905 Acquired absence of kidney: Secondary | ICD-10-CM | POA: Diagnosis not present

## 2019-07-07 DIAGNOSIS — Z87891 Personal history of nicotine dependence: Secondary | ICD-10-CM | POA: Diagnosis not present

## 2019-07-07 DIAGNOSIS — N183 Chronic kidney disease, stage 3 unspecified: Secondary | ICD-10-CM | POA: Diagnosis not present

## 2019-07-07 DIAGNOSIS — Z95810 Presence of automatic (implantable) cardiac defibrillator: Secondary | ICD-10-CM | POA: Diagnosis not present

## 2019-07-07 DIAGNOSIS — I130 Hypertensive heart and chronic kidney disease with heart failure and stage 1 through stage 4 chronic kidney disease, or unspecified chronic kidney disease: Secondary | ICD-10-CM | POA: Diagnosis not present

## 2019-07-07 LAB — POCT INR: INR: 1.5 — AB (ref 2.0–3.0)

## 2019-07-07 NOTE — Patient Instructions (Signed)
Pre visit review using our clinic review tool, if applicable. No additional management support is needed unless otherwise documented below in the visit note.  Lilia Pro from [facility] with phone number at [phone] called with PT/INR result pf 1.5. She was with the pt.  Advised to increase dose today and tomorrow to 4mg  then change weekly dose to 3mg  Mon and Thurs then 2mg  Sun, T, W, F, Sat. Recheck in 1 wks. Lilia Pro and pt verbalized understanding.  read back orders and verbalized understanding.

## 2019-07-08 NOTE — Telephone Encounter (Signed)
INR was called in yesterday

## 2019-07-12 ENCOUNTER — Ambulatory Visit: Payer: PPO | Admitting: Orthopedic Surgery

## 2019-07-12 ENCOUNTER — Ambulatory Visit: Payer: PPO | Admitting: Physician Assistant

## 2019-07-13 ENCOUNTER — Telehealth (HOSPITAL_COMMUNITY): Payer: Self-pay | Admitting: Cardiology

## 2019-07-13 DIAGNOSIS — I5032 Chronic diastolic (congestive) heart failure: Secondary | ICD-10-CM

## 2019-07-13 NOTE — Telephone Encounter (Signed)
Please check BMET   Cardiomems reading at 16. Stable.   No change for now.   Amy Clegg  NP-C  4:05 PM

## 2019-07-13 NOTE — Telephone Encounter (Signed)
Patient called to report her weight has gone down tremendously but she feels great. Reports her weight was 160 and today was 149 Mild dizziness and light headiness  Reports she has submitted her cardio mems reading everyday.  Will forward to provider for further medication advise if needed.  +note HHRN will be at patients home 3/10 if labs are needed+

## 2019-07-14 DIAGNOSIS — I130 Hypertensive heart and chronic kidney disease with heart failure and stage 1 through stage 4 chronic kidney disease, or unspecified chronic kidney disease: Secondary | ICD-10-CM | POA: Diagnosis not present

## 2019-07-14 LAB — BASIC METABOLIC PANEL
BUN: 29 — AB (ref 4–21)
CO2: 30 — AB (ref 13–22)
Chloride: 95 — AB (ref 99–108)
Creatinine: 1.4 — AB (ref 0.5–1.1)
Glucose: 75
Potassium: 4 (ref 3.4–5.3)
Sodium: 141 (ref 137–147)

## 2019-07-14 LAB — COMPREHENSIVE METABOLIC PANEL
Calcium: 9.6 (ref 8.7–10.7)
GFR calc Af Amer: 42
GFR calc non Af Amer: 36

## 2019-07-14 NOTE — Addendum Note (Signed)
Addended by: Kerry Dory on: 07/14/2019 10:51 AM   Modules accepted: Orders

## 2019-07-14 NOTE — Telephone Encounter (Signed)
Pt aware and voiced understanding Order for repeat bmet sent to [facility] via epic faxing

## 2019-07-16 ENCOUNTER — Telehealth: Payer: Self-pay

## 2019-07-16 NOTE — Telephone Encounter (Signed)
Could not get in touch with [name] so called [name] and she reports the lab was drawn on Wed but it came back as insignificant amount. She gave number to office as [phone]. Contacted office and spoke with Benjamine Mola and she said she will have someone to a POCT today and call office to report.

## 2019-07-16 NOTE — Telephone Encounter (Signed)
Per Benjamine Mola, the nurse went out to the home but pt was not home. They contacted pt but she reported she would not be home before 5pm.  Advised to test on 3/15. Benjamine Mola verbalized understanding.

## 2019-07-19 ENCOUNTER — Ambulatory Visit (INDEPENDENT_AMBULATORY_CARE_PROVIDER_SITE_OTHER): Payer: PPO

## 2019-07-19 ENCOUNTER — Telehealth: Payer: Self-pay | Admitting: Family Medicine

## 2019-07-19 ENCOUNTER — Telehealth: Payer: PPO

## 2019-07-19 DIAGNOSIS — Z7901 Long term (current) use of anticoagulants: Secondary | ICD-10-CM | POA: Diagnosis not present

## 2019-07-19 LAB — POCT INR: INR: 1.7 — AB (ref 2.0–3.0)

## 2019-07-19 NOTE — Telephone Encounter (Signed)
Please abstract labs (placed on your desk) and fax to Darrick Grinder, [place], who is with [facility], who ordered BMET per phone note.

## 2019-07-19 NOTE — Patient Instructions (Addendum)
Pre visit review using our clinic review tool, if applicable. No additional management support is needed unless otherwise documented below in the visit note.  [name] from [facility] called from [phone] with INR of 1.7 Advised to increase dose today to 4 mg and increase dose tomorrow to 3mg . Then change weekly dose to 3mg  M, W, F then take 2mg  Sun, Sat, Tues, Thurs. Recheck in 2 wks.  [name] was with pt and pt wrote instructions down and both read back instructions and verbalized understanding.

## 2019-07-19 NOTE — Telephone Encounter (Signed)
Labs abstracted Placed to scan  Nothing further needed.

## 2019-07-20 ENCOUNTER — Other Ambulatory Visit: Payer: Self-pay

## 2019-07-20 ENCOUNTER — Encounter: Payer: Self-pay | Admitting: Physician Assistant

## 2019-07-20 ENCOUNTER — Ambulatory Visit (INDEPENDENT_AMBULATORY_CARE_PROVIDER_SITE_OTHER): Payer: PPO | Admitting: Orthopedic Surgery

## 2019-07-20 VITALS — Ht 66.0 in | Wt 160.0 lb

## 2019-07-20 DIAGNOSIS — L97911 Non-pressure chronic ulcer of unspecified part of right lower leg limited to breakdown of skin: Secondary | ICD-10-CM | POA: Diagnosis not present

## 2019-07-20 DIAGNOSIS — L97921 Non-pressure chronic ulcer of unspecified part of left lower leg limited to breakdown of skin: Secondary | ICD-10-CM | POA: Diagnosis not present

## 2019-07-20 DIAGNOSIS — B351 Tinea unguium: Secondary | ICD-10-CM | POA: Diagnosis not present

## 2019-07-20 DIAGNOSIS — I87313 Chronic venous hypertension (idiopathic) with ulcer of bilateral lower extremity: Secondary | ICD-10-CM

## 2019-07-20 DIAGNOSIS — I87319 Chronic venous hypertension (idiopathic) with ulcer of unspecified lower extremity: Secondary | ICD-10-CM

## 2019-07-20 DIAGNOSIS — L97909 Non-pressure chronic ulcer of unspecified part of unspecified lower leg with unspecified severity: Secondary | ICD-10-CM

## 2019-07-20 NOTE — Progress Notes (Signed)
Office Visit Note   Patient: ALEEHA BOLINE           Date of Birth: Apr 16, 1934           MRN: [id number] Visit Date: 07/20/2019              Requested by: Elby Beck, [address] [place],  [place] [address] PCP: Elby Beck, FNP  Chief Complaint  Patient presents with  . Left Leg - Follow-up  . Right Leg - Follow-up      HPI: Patient is an 86 year old woman who presents in follow-up for cellulitis dermatitis and massive venous ulcers of both legs.  She has undergone a 3 layer compression wrap.  Patient states that her legs feel much better she states she has lost a lot of fluid weight she has been having home health nursing to change the dressing weekly.  Assessment & Plan: Visit Diagnoses:  1. Chronic venous hypertension w ulceration ([place])     Plan: Patient has shown excellent improvement we will transition her to compression stockings she will get a pair of size medium knee-high compression stockings 15 to 20 mm compression we will wrap her legs today and when she gets the sock she will transition to the socks.  Follow-Up Instructions: Return in about 2 weeks (around 08/03/2019).   Ortho Exam  Patient is alert, oriented, no adenopathy, well-dressed, normal affect, normal respiratory effort. Examination patient has significant improvement in both legs her calf measures 34 cm in circumference bilaterally.  She has some dry skin the dermatitis and cellulitis has resolved there is no drainage there is no odor.  There is some superficial dry skin but no ulcers.  She does have thickened discolored onychomycotic nails x10 she is unable to safely trim the nails on her own and the nails were trimmed F68 without complications.  Imaging: No results found. No images are attached to the encounter.  Labs: Lab Results  Component Value Date   HGBA1C 6.1 (H) 08/17/2014   HGBA1C 5.6 01/14/2014   ESRSEDRATE 2 02/10/2012   REPTSTATUS 11/06/2018 FINAL 11/04/2018    GRAMSTAIN NO WBC SEEN NO ORGANISMS SEEN  02/21/2016   CULT (A) 11/04/2018    <10,000 COLONIES/mL INSIGNIFICANT GROWTH Performed at [facility], [place] [address], [place], [place] [address]    LABORGA ESCHERICHIA COLI 12/24/2016     Lab Results  Component Value Date   ALBUMIN 3.7 05/06/2019   ALBUMIN 3.0 (L) 03/03/2019   ALBUMIN 3.0 (L) 03/02/2019    Lab Results  Component Value Date   MG 2.1 03/10/2019   MG 2.1 03/08/2019   MG 2.4 03/05/2019   No results found for: VD25OH  No results found for: PREALBUMIN CBC EXTENDED Latest Ref Rng & Units 06/21/2019 06/03/2019 05/06/2019  WBC 4.0 - 10.5 K/uL 4.5 3.8(L) 3.6(L)  RBC 3.87 - 5.11 MIL/uL 4.74 4.03 3.74(L)  HGB 12.0 - 15.0 g/dL 10.2(L) 8.2(L) 8.2 Repeated and verified X2.(L)  HCT 36.0 - 46.0 % 37.3 29.4(L) 27.5(L)  PLT 150 - 400 K/uL 233 266 270.0  NEUTROABS 1.4 - 7.7 K/uL - - 2.5  LYMPHSABS 0.7 - 4.0 K/uL - - 0.5(L)     Body mass index is 25.82 kg/m.  Orders:  No orders of the defined types were placed in this encounter.  No orders of the defined types were placed in this encounter.    Procedures: No procedures performed  Clinical Data: No additional findings.  ROS:  All other systems negative, except as noted in the HPI. Review of Systems  Objective: Vital Signs: Ht 5\' 6"  (1.676 m)   Wt 160 lb (72.6 kg)   BMI 25.82 kg/m   Specialty Comments:  No specialty comments available.  PMFS History: Patient Active Problem List   Diagnosis Date Noted  . Chronic venous hypertension w ulceration ([place])   . Melena   . AKI (acute kidney injury) ([place]) 03/03/2019  . Symptomatic anemia 02/25/2019  . Acute on chronic diastolic CHF (congestive heart failure), NYHA class 4 ([place]) 11/04/2018  . Acute blood loss anemia 11/04/2018  . Chronic bilateral pleural effusions 11/04/2018  . Shortness of breath 10/21/2018  . Displaced fracture of left femoral neck ([place]) 10/09/2018  . Somnolence   . Closed left hip  fracture ([place]) 10/07/2018  . Fall   . Hypokalemia   . Afib ([place]) 06/01/2018  . Urinary frequency 09/18/2017  . Iliac artery stenosis, bilateral ([place]) 07/14/2017  . Recurrent UTI 07/14/2017  . Long term (current) use of anticoagulants 05/08/2017  . Anemia 11/18/2016  . Dysuria 11/18/2016  . Pulmonary hypertension ([place]) 12/04/2015  . B12 deficiency 05/22/2015  . PAD (peripheral artery disease) ([place])   . Elevated serum creatinine   . Renal cell carcinoma ([place])   . S/p nephrectomy   . TIA (transient ischemic attack) 08/16/2014  . Weakness 01/14/2014  . Chronic kidney disease (CKD), stage IV (severe) ([place]) 12/08/2013  . Preoperative cardiovascular examination 06/07/2013  . Chronic diastolic CHF (congestive heart failure) ([place]) 06/04/2013  . Mitral valve regurgitation 06/04/2013  . Allergic rhinitis 01/01/2013  . Chronic cholecystitis with calculus 09/15/2012  . Claudication ([place]) 01/21/2012  . Dizziness 11/26/2011  . Hypertension 05/20/2011  . Avascular necrosis of hip ([place]) 05/03/2011  . OTHER AND UNSPECIFIED COAGULATION DEFECTS 10/13/2009  . RECTAL BLEEDING 10/13/2009  . HOCM / IHSS 07/14/2009  . EDEMA 07/14/2009  . Hyperlipidemia 04/10/2009  . ADJUSTMENT DISORDER WITH ANXIETY 04/10/2009  . INCONTINENCE, URGE 04/10/2009  . Permanent atrial fibrillation 10/27/2008  . SINUS BRADYCARDIA 10/27/2008  . Carotid artery stenosis 10/27/2008  . COPD (chronic obstructive pulmonary disease) ([place]) 10/27/2008  . ICD (implantable cardioverter-defibrillator) in place 10/27/2008   Past Medical History:  Diagnosis Date  . Adjustment disorder with anxiety   . Anginal pain ([place])   . Arthritis    "some in my hands" (11/11/2012)  . Atrial fibrillation ([place])   . Automatic implantable cardioverter-defibrillator in situ   . Blood in stool   . Carotid artery stenosis 09/2007   a. 09/2007: 60-79% bilateral (stable); b. 10/2008: 40-59% R 60-79%   . Chest pain, unspecified   . Chronic airway  obstruction, not elsewhere classified   . Chronic bronchitis ([place])    "get it some; not q year" (11/11/2012)  . Chronic diastolic CHF (congestive heart failure) ([place]) 06/04/2013  . Chronic kidney disease, unspecified   . Coronary artery disease    non-obstructive by 2006 cath  . Frequent UTI    "get them a couple times/yr" (11/11/2012)  . Hemorrhage of rectum and anus   . High cholesterol   . History of blood transfusion 04/2011   "after hip OR" (11/11/2012)  . Hypertension 05/20/2011  . Hypertr obst cardiomyop   . Hypotension, unspecified    cardiac cath 2006..nonobstructive CAD 30-40s lesions.Marland KitchenETT 1/09 nondiagnostic due to poor HR response..Right Renal Cancer 2003  . Long term (current) use of anticoagulants   . Malignant neoplasm of kidney, except pelvis   . Osteoarthritis of right  hip   . Other and unspecified coagulation defects   . PONV (postoperative nausea and vomiting)   . Renal cancer ([place]) 06/2001   Right  . Secondary cardiomyopathy, unspecified   . Sinus bradycardia   . Urge incontinence     Family History  Problem Relation Age of Onset  . Heart failure Mother   . Breast cancer Maternal Aunt 82  . Breast cancer Cousin   . Breast cancer Other   . Colon cancer Neg Hx     Past Surgical History:  Procedure Laterality Date  . ABDOMINAL HYSTERECTOMY  1975   for benign causes  . APPENDECTOMY    . BI-VENTRICULAR PACEMAKER UPGRADE  05/04/2010  . BIOPSY  02/28/2019   Procedure: BIOPSY;  Surgeon: Milus Banister, MD;  Location[facility] ENDOSCOPY;  Service: Endoscopy;;  . CARDIAC CATHETERIZATION  2006  . CARDIOVERSION N/A 02/20/2018   Procedure: CARDIOVERSION;  Surgeon: Minna Merritts, MD;  Location: [facility] ORS;  Service: Cardiovascular;  Laterality: N/A;  . CARDIOVERSION N/A 03/27/2018   Procedure: CARDIOVERSION;  Surgeon: Minna Merritts, MD;  Location: [facility] ORS;  Service: Cardiovascular;  Laterality: N/A;  . CATARACT EXTRACTION W/ INTRAOCULAR LENS  IMPLANT, BILATERAL   01/2006-02-2006  . CHOLECYSTECTOMY N/A 11/11/2012   Procedure: LAPAROSCOPIC CHOLECYSTECTOMY WITH INTRAOPERATIVE CHOLANGIOGRAM;  Surgeon: Imogene Burn. Georgette Dover, MD;  Location: [place];  Service: General;  Laterality: N/A;  . COLONOSCOPY WITH PROPOFOL N/A 02/28/2019   Procedure: COLONOSCOPY WITH PROPOFOL;  Surgeon: Milus Banister, MD;  Location: [facility] ENDOSCOPY;  Service: Endoscopy;  Laterality: N/A;  . EP IMPLANTABLE DEVICE N/A 02/21/2016   Procedure: ICD Generator Changeout;  Surgeon: Deboraha Sprang, MD;  Location: [place] [facility] LAB;  Service: Cardiovascular;  Laterality: N/A;  . ESOPHAGOGASTRODUODENOSCOPY (EGD) WITH PROPOFOL N/A 02/28/2019   Procedure: ESOPHAGOGASTRODUODENOSCOPY (EGD) WITH PROPOFOL;  Surgeon: Milus Banister, MD;  Location: [facility] ENDOSCOPY;  Service: Endoscopy;  Laterality: N/A;  . GIVENS CAPSULE STUDY N/A 03/15/2019   Procedure: GIVENS CAPSULE STUDY;  Surgeon: Thornton Park, MD;  Location: [place];  Service: Gastroenterology;  Laterality: N/A;  . INSERT / REPLACE / REMOVE PACEMAKER  05-01-11   02-28-05-/05-04-10-ICD-MEDTRONIC MAXIMAL DR  . JOINT REPLACEMENT    . LAPAROSCOPIC CHOLECYSTECTOMY  11/11/2012  . LAPAROSCOPIC LYSIS OF ADHESIONS N/A 11/11/2012   Procedure: LAPAROSCOPIC LYSIS OF ADHESIONS;  Surgeon: Imogene Burn. Georgette Dover, MD;  Location: [place];  Service: General;  Laterality: N/A;  . NEPHRECTOMY Right 06/2001    S/P RENAL CELL CANCER  . PRESSURE SENSOR/CARDIOMEMS N/A 02/03/2019   Procedure: PRESSURE SENSOR/CARDIOMEMS;  Surgeon: Larey Dresser, MD;  Location: [place] CV LAB;  Service: Cardiovascular;  Laterality: N/A;  . RIGHT HEART CATH N/A 11/09/2018   Procedure: RIGHT HEART CATH;  Surgeon: Larey Dresser, MD;  Location: [place] CV LAB;  Service: Cardiovascular;  Laterality: N/A;  . RIGHT HEART CATH N/A 03/08/2019   Procedure: RIGHT HEART CATH;  Surgeon: Larey Dresser, MD;  Location: [place] CV LAB;  Service: Cardiovascular;  Laterality: N/A;  . SUBMUCOSAL TATTOO  INJECTION  02/28/2019   Procedure: SUBMUCOSAL TATTOO INJECTION;  Surgeon: Milus Banister, MD;  Location: [facility] ENDOSCOPY;  Service: Endoscopy;;  . TOTAL HIP ARTHROPLASTY Right 05/03/2011   Procedure: TOTAL HIP ARTHROPLASTY ANTERIOR APPROACH;  Surgeon: Mcarthur Rossetti;  Location: [facility] ORS;  Service: Orthopedics;  Laterality: Right;  Removal of Cannulated Screws Right Hip, Right Direct Anterior Hip Replacement  . TOTAL HIP ARTHROPLASTY Left 10/09/2018   Procedure: TOTAL HIP  ARTHROPLASTY ANTERIOR APPROACH;  Surgeon: Rod Can, MD;  Location: [place];  Service: Orthopedics;  Laterality: Left;   Social History   Occupational History  . Occupation: Retired    Fish farm manager: RETIRED  Tobacco Use  . Smoking status: Former Smoker    Packs/day: 0.50    Years: 40.00    Pack years: 20.00    Types: Cigarettes    Quit date: 05/06/2001    Years since quitting: 18.2  . Smokeless tobacco: Never Used  Substance and Sexual Activity  . Alcohol use: No    Alcohol/week: 0.0 standard drinks  . Drug use: No  . Sexual activity: Never

## 2019-07-26 ENCOUNTER — Telehealth: Payer: Self-pay | Admitting: Family Medicine

## 2019-07-26 ENCOUNTER — Encounter: Payer: Self-pay | Admitting: Family Medicine

## 2019-07-26 LAB — BUN/CREATININE RATIO: BUN/Creatinine Ratio: 19

## 2019-07-26 NOTE — Telephone Encounter (Addendum)
ATC again this afternoon x 2 - line busy ATC mobile number, no voicemail set up.   Spoke with patient's granddaughter and she is going to try and reach her -- she states that she had someone there with her most of the day today and she was fine around 230/300pm when they left her - I just advised that it was a routine call to talk to her about her medications and her phones been busy all since we spoke this morning - just wanted to ensure everything was okay.   She is going to check in with the patient and have her call us tomorrow morning.

## 2019-07-26 NOTE — Telephone Encounter (Signed)
Spoke with patient, having increased dizziness since Friday morning - pt states that her ears have been stopping up a lot and she feels that this is contributing to her dizziness. Labs done with Cards - pt states that she has not received the results. Pt has been blowing nose some, not a lot of congestion. Feels related to allergies?? Pt sounds really good on the phone.  Fluid is down, Weight is down to 145! Denies SOB, nausea, no increased chest pressure/pain  Pt is concerned of the dizziness and she is wondering what her blood levels are.  BP yesterday AM - Standing: 127/58, HR 63  Sitting: 128/55, HR 51 BP Yesterday PM - Standing: 133/61 HR 62   Sitting: 129/45 HR 63    Pt wanting to know if you want to see her today - she may not be able to come in office but could do virtual(phone).   *see lab results from [facility] 3/10, INR results 3/15

## 2019-07-26 NOTE — Telephone Encounter (Signed)
Noted  

## 2019-07-26 NOTE — Telephone Encounter (Signed)
Patient is requesting a call back Patient has been having extreme dizziness since Friday morning  Patient declined speaking with Triage nurse. Stated she would rather speak with [name]

## 2019-07-26 NOTE — Telephone Encounter (Signed)
Please call patient and let her know that I reviewed her labs and they are stable. Her vital signs look good and I read her most recent visit with Dr. Sharol Given and it sounds like her legs are significantly improved. The lightheadedness could certainly be related to seasonal allergies which are really bad right now due to the trees blooming. Is she still taking Zyrtec? It is on her list. If so, has she been taking it for a long time? Has she tried Flonase nasal spray? I can send some in to her pharmacy if she would like.

## 2019-07-26 NOTE — Telephone Encounter (Signed)
ATC x 2, busy signal Will call back

## 2019-07-27 ENCOUNTER — Other Ambulatory Visit: Payer: Self-pay | Admitting: Family Medicine

## 2019-07-27 DIAGNOSIS — H6992 Unspecified Eustachian tube disorder, left ear: Secondary | ICD-10-CM

## 2019-07-27 DIAGNOSIS — R0981 Nasal congestion: Secondary | ICD-10-CM

## 2019-07-27 MED ORDER — FLUTICASONE PROPIONATE 50 MCG/ACT NA SUSP: 2.0000 | Freq: Every day | NASAL | 6 refills | Status: DC | PRN

## 2019-07-27 NOTE — Telephone Encounter (Signed)
Patient advised and appointment made 

## 2019-07-27 NOTE — Telephone Encounter (Signed)
Please call patient and offer her an in person visit to examine her ears and do orthostatic vital signs.

## 2019-07-27 NOTE — Telephone Encounter (Signed)
Called to schedule patient with [name] tomorrow (Wed 07/28/19) - phone rang several times with no answer.  Will call back after 3pm -- patient noted earlier this morning that she had appts from 11-3 today.   Will send to Anastasiya to help me follow up with this today as I am out of the office starting this afternoon.

## 2019-07-27 NOTE — Telephone Encounter (Signed)
Patient returned called Advised of Debbie's message.  Patient stated that she is still taking the Zyrtec and as been for quite some time. She did not know exactly when she started taking this.  The patient would like to start the Flonase nasal spray and would like that sent in for her @ [facility]   Patient is concerned with the lightheadedness/dizziness.  She said it gets so bad sometimes that she has to hold on the the wall. She also said that both of her ears are really stopped up.

## 2019-07-28 ENCOUNTER — Ambulatory Visit (INDEPENDENT_AMBULATORY_CARE_PROVIDER_SITE_OTHER): Payer: PPO | Admitting: Family Medicine

## 2019-07-28 ENCOUNTER — Other Ambulatory Visit: Payer: Self-pay

## 2019-07-28 ENCOUNTER — Encounter: Payer: Self-pay | Admitting: Family Medicine

## 2019-07-28 VITALS — BP 138/62 | HR 66 | Temp 97.2°F | Ht 66.0 in | Wt 146.0 lb

## 2019-07-28 DIAGNOSIS — H6123 Impacted cerumen, bilateral: Secondary | ICD-10-CM

## 2019-07-28 DIAGNOSIS — I5032 Chronic diastolic (congestive) heart failure: Secondary | ICD-10-CM | POA: Diagnosis not present

## 2019-07-28 DIAGNOSIS — R350 Frequency of micturition: Secondary | ICD-10-CM | POA: Diagnosis not present

## 2019-07-28 DIAGNOSIS — R42 Dizziness and giddiness: Secondary | ICD-10-CM | POA: Diagnosis not present

## 2019-07-28 DIAGNOSIS — R310 Gross hematuria: Secondary | ICD-10-CM

## 2019-07-28 LAB — URINALYSIS, MICROSCOPIC ONLY

## 2019-07-28 LAB — POC URINALSYSI DIPSTICK (AUTOMATED)
Bilirubin, UA: NEGATIVE
Glucose, UA: NEGATIVE
Ketones, UA: NEGATIVE
Leukocytes, UA: NEGATIVE
Nitrite, UA: NEGATIVE
Protein, UA: NEGATIVE
Spec Grav, UA: 1.01 (ref 1.010–1.025)
Urobilinogen, UA: 0.2 E.U./dL
pH, UA: 7 (ref 5.0–8.0)

## 2019-07-28 NOTE — Progress Notes (Signed)
Subjective:    Patient ID: [name], female    DOB: 1933-12-04, 86 y.o.   MRN: [id number]  HPI Chief Complaint  Patient presents with  . Dizziness   This is an 86 yo female who presents today with above cc. Started 6 days ago. Sensation of spinning. Started while standing at [employer], was looking down at at her check book. Some with standing up. Chronic stuffy nose, ears feel stopped up x 3 weeks.  Using fluticasone nasal spray 1-2 times a day, cetirizine daily for long time. Dizziness has decreased some, has in spells. Labs done earlier this month stable.   CHF- weight was running 170s, woke up one morning and was down in 140s. Has been really strict about sodium intake. Limits fluids to 48 ounces daily. Improved breathing.   Lower extremity edema with venous stasis ulcers- followed by Dr. Sharol Given. Significantly improved. Currently with ace wraps, working towards using medium compression socks.   Some urinary frequency and blood with wiping. No dysuria, abdominal pain, fever/chills.    Review of Systems Per HPI    Objective:   Physical Exam Vitals reviewed.  Constitutional:      General: She is not in acute distress.    Appearance: Normal appearance. She is normal weight. She is not ill-appearing, toxic-appearing or diaphoretic.  HENT:     Head: Normocephalic and atraumatic.     Right Ear: There is impacted cerumen.     Left Ear: There is impacted cerumen.     Ears:     Comments: Pale dry cerumen at distal canal.  Eyes:     Conjunctiva/sclera: Conjunctivae normal.  Cardiovascular:     Rate and Rhythm: Normal rate and regular rhythm.     Heart sounds: Normal heart sounds.  Pulmonary:     Effort: Pulmonary effort is normal.     Breath sounds: Normal breath sounds.  Musculoskeletal:     Cervical back: Normal range of motion and neck supple.     Comments: Bilateral legs wrapped in ace bandages from foot to thigh.   Skin:    General: Skin is warm and dry.   Neurological:     Mental Status: She is alert and oriented to person, place, and time.  Psychiatric:        Mood and Affect: Mood normal.        Behavior: Behavior normal.        Thought Content: Thought content normal.        Judgment: Judgment normal.      BP (!) 138/52 (BP Location: Left Arm, Patient Position: Sitting, Cuff Size: Normal)   Pulse (!) 59   Temp (!) 97.2 F (36.2 C) (Temporal)   Ht 5\' 6"  (1.676 m)   Wt 146 lb (66.2 kg)   BMI 23.57 kg/m  Wt Readings from Last 3 Encounters:  07/28/19 146 lb (66.2 kg)  07/20/19 160 lb (72.6 kg)  06/28/19 160 lb (72.6 kg)   Orthostatic VS for the past 24 hrs (Last 3 readings):  BP- Lying Pulse- Lying BP- Sitting Pulse- Sitting BP- Standing at 0 minutes Pulse- Standing at 0 minutes  07/28/19 1324 130/60 64 132/58 70 138/62 66  Bilateral ear canal irrigated with room temperature tap water. Good removal from left ear, small amount removed from right. Patient tolerated well.  Results for orders placed or performed in visit on 07/28/19  POCT Urinalysis Dipstick (Automated)  Result Value Ref Range   Color, UA Yellow  Clarity, UA Clear    Glucose, UA Negative Negative   Bilirubin, UA Neg    Ketones, UA Neg    Spec Grav, UA 1.010 1.010 - 1.025   Blood, UA 2+    pH, UA 7.0 5.0 - 8.0   Protein, UA Negative Negative   Urobilinogen, UA 0.2 0.2 or 1.0 E.U./dL   Nitrite, UA Neg    Leukocytes, UA Negative Negative   *Note: Due to a large number of results and/or encounters for the requested time period, some results have not been displayed. A complete set of results can be found in Results Review.       Assessment & Plan:  1. Urinary frequency - POCT Urinalysis Dipstick (Automated)  2. Gross hematuria - Urine Microscopic - Urine Culture  3. Dizziness - unclear etiology, orthostatic vs reassuring, recent labs stable.  - could be related to bilateral cerumen impaction, chronic allergies. Instructions provided on AVS, will try  different antihistamine for a couple of weeks  4. Chronic diastolic CHF (congestive heart failure) (HCC) - good diuresis recently and now at goal weight - discussed continued sodium restriction, fluid restriction - continued follow up by cardiology  5. Bilateral impacted cerumen - per above  This visit occurred during the SARS-CoV-2 public health emergency.  Safety protocols were in place, including screening questions prior to the visit, additional usage of staff PPE, and extensive cleaning of exam room while observing appropriate contact time as indicated for disinfecting solutions.   Follow up in 6 months   Clarene Reamer, FNP-BC  [facility]  07/28/2019 1:29 PM

## 2019-07-28 NOTE — Patient Instructions (Addendum)
Look at post op shoes, should be able to put over wraps  May use cotton ball soaked in mineral oil into ear 10-20 min per week if having recurrent ear wax accumulation. May use dilute hydrogen peroxide (equal parts this and water) and place a few drops into ear every 2 weeks to help break down wax buildup.

## 2019-07-29 LAB — URINE CULTURE
MICRO NUMBER:: 10287550
Result:: NO GROWTH
SPECIMEN QUALITY:: ADEQUATE

## 2019-07-29 NOTE — Progress Notes (Signed)
I have reviewed this visit and I agree on the patient's plan of dosage and recommendations. Rylee Nuzum B Arriyana Rodell, FNP   

## 2019-07-29 NOTE — Progress Notes (Signed)
I have reviewed this visit and I agree on the patient's plan of dosage and recommendations. Kamyrah Feeser B Kiyla Ringler, FNP   

## 2019-08-02 ENCOUNTER — Encounter (HOSPITAL_COMMUNITY): Payer: PPO

## 2019-08-03 ENCOUNTER — Ambulatory Visit (INDEPENDENT_AMBULATORY_CARE_PROVIDER_SITE_OTHER): Payer: PPO

## 2019-08-03 ENCOUNTER — Encounter (HOSPITAL_COMMUNITY): Payer: PPO

## 2019-08-03 ENCOUNTER — Telehealth: Payer: Self-pay | Admitting: Orthopedic Surgery

## 2019-08-03 ENCOUNTER — Ambulatory Visit: Payer: PPO | Admitting: Physician Assistant

## 2019-08-03 ENCOUNTER — Telehealth: Payer: Self-pay | Admitting: *Deleted

## 2019-08-03 DIAGNOSIS — Z7901 Long term (current) use of anticoagulants: Secondary | ICD-10-CM

## 2019-08-03 LAB — POCT INR: INR: 2.4 (ref 2.0–3.0)

## 2019-08-03 NOTE — Telephone Encounter (Signed)
Documented in anticoag encounter.

## 2019-08-03 NOTE — Telephone Encounter (Signed)
Mendel Ryder nurse with [facility] called to report a INR today of 2.4.

## 2019-08-03 NOTE — Patient Instructions (Addendum)
Pre visit review using our clinic review tool, if applicable. No additional management support is needed unless otherwise documented below in the visit note.  Mendel Ryder from [facility] called from [phone] with INR of 2.4.   Continue weekly dosing of 3mg  M, W, F then take 2mg  Sun, Sat, Tues, Thurs. Calendar was not updated with last encounter so it has now been updated. Recheck in 4 wks.  Advised pt of result and dosing and she read back instructions and verbalized understanding.  Contacted Mendel Ryder and advised to recheck in 4 wks. She is not sure if pt will still be eligible for visits at that time but she will put it on the schedule and if they cannot do the check she will contact this office.

## 2019-08-03 NOTE — Telephone Encounter (Signed)
Lindsey from [employer] called. She would like to know if there is an order for compression stockings for the patient. If not, she would like to get a RX for compression stockings for her. Her call back number is [phone]

## 2019-08-04 NOTE — Telephone Encounter (Signed)
Pt did use the rx she was given to pick up the compression socks. She did not want to wear them but [facility] was able to have the pt agree to application and she actually loved them being on. They have an order to d/c dressings and application of socks pt will follow up in the office on 08/11/19 and to call with any questions.

## 2019-08-07 ENCOUNTER — Telehealth: Payer: Self-pay

## 2019-08-07 DIAGNOSIS — I4821 Permanent atrial fibrillation: Secondary | ICD-10-CM

## 2019-08-07 DIAGNOSIS — I10 Essential (primary) hypertension: Secondary | ICD-10-CM

## 2019-08-07 NOTE — Chronic Care Management (AMB) (Signed)
Chronic Care Management Pharmacy  Name: [name]  MRN: [id number] DOB: February 28, 1934  Chief Complaint/ HPI  [name],  86 y.o., female presents for their Initial CCM visit with the clinical pharmacist via telephone.  PCP : [name] Beck, FNP   Their chronic conditions include: hypertension, afib, hyperlipidemia, heart failure, allergies, iron deficiency anemia, anxiety, PAD, CAD, COPD, CKD with right neprectomy, B12 deficiency, urge incontinence  Patient concerns: dizziness for past 3 weeks, no recent falls, saw PCP on 3/24 and had ear wax removed and switched from cetirizine to loratadine, blood pressure log reviewed, no change in dizziness --> refer to PCP   Office Visits:  07/28/19: [name] - dizziness, recommend ear wax removal with mineral oil or hydrogen peroxide, try different antihistamine   04/06/19: Home Health Services   Consult Visit:  07/20/19: Orthopedic surgery - htn with ulceration, transition to compression stockings  06/21/19: Cardiology - cont. current meds  Allergies  Allergen Reactions  . Dilaudid [Hydromorphone] Other (See Comments)    Excessive Somnolence- Required Narcan  . Fentanyl Other (See Comments)    Required Narcan when given with Dilaudid. Tolerated single dose when given during procedure 02/03/2019.   Marland Kitchen Codeine Nausea And Vomiting    Hallucinations   . Morphine And Related Nausea And Vomiting    Hallucinations   . Sulfonamide Derivatives Other (See Comments)    Dry mouth   Medications: Outpatient Encounter Medications as of 08/09/2019  Medication Sig  . acetaminophen (TYLENOL) 500 MG tablet Take 500-1,000 mg by mouth every 8 (eight) hours as needed for mild pain or headache.   . bisacodyl (DULCOLAX) 5 MG EC tablet Take 1 tablet (5 mg total) by mouth daily as needed for moderate constipation.  . cetirizine (ZYRTEC) 10 MG tablet Take 10 mg by mouth daily.   Marland Kitchen conjugated estrogens (PREMARIN) vaginal cream Place 1 Applicatorful  vaginally daily.  . Cranberry (AZO CRANBERRY GUMMIES) 500 MG CHEW Chew 2,000 mg by mouth daily.  Marland Kitchen doxycycline (VIBRA-TABS) 100 MG tablet Take 1 tablet (100 mg total) by mouth 2 (two) times daily.  Marland Kitchen ezetimibe (ZETIA) 10 MG tablet Take 1 tablet (10 mg total) by mouth daily.  . ferrous sulfate 325 (65 FE) MG tablet Take 1 tablet (325 mg total) by mouth 2 (two) times daily with a meal.  . fluticasone (FLONASE) 50 MCG/ACT nasal spray Place 2 sprays into both nostrils daily as needed for allergies or rhinitis.  . hydrocortisone 2.5 % cream Apply 1 application topically daily as needed (to itchy areas).   Marland Kitchen levothyroxine (SYNTHROID) 25 MCG tablet TAKE 1 TABLET BY MOUTH ONCE DAILY BEFOREBREAKFAST  . magnesium oxide (MAG-OX) 400 MG tablet Take 1 tablet (400 mg total) by mouth daily.  . metolazone (ZAROXOLYN) 5 MG tablet Take 1 tablet (5 mg total) by mouth 3 (three) times a week. Tues, thurs, sat  . potassium chloride SA (KLOR-CON) 20 MEQ tablet Take 2 tablets (40 mEq total) by mouth 2 (two) times daily. Take 1 extra tab on Tue and Thur with metolazone  . rosuvastatin (CRESTOR) 40 MG tablet Take 1 tablet (40 mg total) by mouth daily.  Marland Kitchen torsemide (DEMADEX) 100 MG tablet Take 1 tablet (100 mg total) by mouth 2 (two) times daily.  . vitamin C (ASCORBIC ACID) 250 MG tablet Take 500 mg by mouth daily.  Marland Kitchen warfarin (COUMADIN) 2 MG tablet Take 1 tablet (2 mg total) by mouth one time only at 6 PM for 1 dose. Directions for  dosing per coumadin clinic.   No facility-administered encounter medications on file as of 08/09/2019.   Current Diagnosis/Assessment: Goals    . other     I will contact cardiologist or his nurse to discuss parameters for daily fluid intake and daily weight.     . Pharmacy Care Plan     CARE PLAN ENTRY  Current Barriers:  . Chronic Disease Management support, education, and care coordination needs related to hypertension, afib, hyperlipidemia, heart failure, allergies, iron deficiency  anemia, anxiety, PAD, CAD, COPD, CKD with right neprectomy, B12 deficiency, urge incontinence  Pharmacist Clinical Goal(s):  Marland Kitchen Medications reviewed for safety with current kidney function and drug interactions. Separate doxycycline from levothyroxine, iron, and magnesium to prevent drug interaction. No medication changes indicated.  . Remain up to date on vaccinations. Recommend checking price on shingles 2-dose vaccine series (Shingrix) from local pharmacy.   Interventions: . Comprehensive medication review performed  Patient Self Care Activities:  . Self administers medications as prescribed . Self-monitors blood pressure   Initial goal documentation       Hypertension   CMP Latest Ref Rng & Units 07/14/2019 06/21/2019 06/09/2019  Glucose 70 - 99 mg/dL - 100(H) -  BUN 4 - 21 29(A) 28(H) 30(A)  Creatinine 0.5 - 1.1 1.4(A) 1.32(H) 1.6(A)  Sodium 137 - 147 141 138 138  Potassium 3.4 - 5.3 4.0 4.4 4.1  Chloride 99 - 108 95(A) 93(L) 94(A)  CO2 13 - 22 30(A) 32 28(A)  Calcium 8.7 - 10.7 9.6 8.9 9.0  Total Protein 6.0 - 8.3 g/dL - - -  Total Bilirubin 0.2 - 1.2 mg/dL - - -  Alkaline Phos 39 - 117 U/L - - -  AST 0 - 37 U/L - - -  ALT 0 - 35 U/L - - -   Office blood pressures are: BP Readings from Last 3 Encounters:  07/28/19 138/62  06/21/19 (!) 142/60  06/16/19 (!) 130/43   BP goal < 140/90 mmHg BP today is: 122/51, 62 (today, sitting) Patient has failed these meds in the past: none Patient checks BP at home: with symptoms  Patient home BP readings are ranging: reports checked several days last week, called and gave readings to PCP and BP was good, both standing and sitting   Patient is currently controlled on the following medications:   Torsemide 20 mg - 5 tablets (100 mg) twice daily  Metolazone 5 mg - Tues, Thurs, Sat  Potassium chloride SA 20 mEq - 2 tablets BID, 1 extra with metolazone  Plan: Continue current medications  Hyperlipidemia   Lipid Panel       Component Value Date/Time   CHOL 130 06/03/2019 1508   CHOL 271 (H) 06/09/2014 1317   TRIG 83 06/03/2019 1508   TRIG 241 (H) 06/09/2014 1317   HDL 34 (L) 06/03/2019 1508   HDL 40 06/09/2014 1317   CHOLHDL 3.8 06/03/2019 1508   VLDL 17 06/03/2019 1508   VLDL 48 (H) 06/09/2014 1317   LDLCALC 79 06/03/2019 1508   LDLCALC 183 (H) 06/09/2014 1317   LDLDIRECT 146.0 11/07/2016 1128    LDL goal < 70 Patient has failed these meds in past: none Patient is currently uncontrolled on the following medications:   Rosuvastatin 40 mg - 1 tablet daily  Zetia 10 mg - 1 tablet daily (not taking)  We discussed: reports Zetia was discontinued while in hospital Fall 2020; denies any problems with Zetia or cost concerns, she is open to resume therapy,  however, as LDL near goal, will continue current medications; pt reports making significant dietary changes recently; rosuvastatin dose appropriate per current CrCl > 30 ml/min  Plan: Continue current medications  AFIB   Symptoms: reports frequent symptoms Patient has failed these meds in past: rate control, amiodarone, Tikosyn, and flecainide as well as multiple cardioversions Patient is currently controlled on the following medications:   Warfarin 2 mg - 1 tablet once daily (M,W,F 3 mg, 2 mg the rest of the week)  Magnesium oxide 400 mg - 1 tablet daily (started by Caryl Comes 05/2018)  We discussed: INR monitoring at [facility], reviewed pt questions about magnesium indication, most recent level within normal limits  Plan: Continue current medications  COPD / Tobacco   Last spirometry score: none per chart  Eosinophil count:   Lab Results  Component Value Date/Time   EOSPCT 0.3 05/06/2019 12:01 PM  %                               Eos (Absolute):  Lab Results  Component Value Date/Time   EOSABS 0.0 05/06/2019 12:01 PM   EOSABS 0.2 03/22/2019 02:15 PM   Tobacco Status:  Social History   Tobacco Use  Smoking Status Former Smoker  .  Packs/day: 0.50  . Years: 40.00  . Pack years: 20.00  . Types: Cigarettes  . Quit date: 05/06/2001  . Years since quitting: 18.2  Smokeless Tobacco Never Used   Patient has failed these meds in past: none Patient is currently controlled on the following medications:   No pharmacotherapy  We discussed: denies shortness of breath, reports oxygen level 98-99% with home nurse, breathing has not been an issue since fluid status improved   Plan: Continue current medications  Heart Failure   Type: Combined Systolic and Diastolic  Last ejection fraction: 10/2018 60-65% NYHA Class: II (slight limitation of activity) AHA HF Stage: C (Heart disease and symptoms present)  Patient has failed these meds in past: none Patient is currently controlled on the following medications:   Torsemide 20 mg mg - 100 mg BID   Metolazone 5 mg - Tues, Thurs, Sat  Potassium chloride SA 20 mEq - 2 tablets BID, 1 extra with metolazone  Weight today: 145.8 (reports down from 175, feeling a lot better until recent dizziness), denies swelling; wears compression stockings; has cut back on sodium: rinses canned vegetables, avoiding canned soups, not using table salt We discussed weighing daily; if you gain more than 3 pounds in one day or 5 pounds in one week call your doctor   Plan: Continue current medications   Anemia   CBC Latest Ref Rng & Units 06/21/2019 06/03/2019 05/06/2019  WBC 4.0 - 10.5 K/uL 4.5 3.8(L) 3.6(L)  Hemoglobin 12.0 - 15.0 g/dL 10.2(L) 8.2(L) 8.2 Repeated and verified X2.(L)  Hematocrit 36.0 - 46.0 % 37.3 29.4(L) 27.5(L)  Platelets 150 - 400 K/uL 233 266 270.0   Iron/TIBC/Ferritin/ %Sat    Component Value Date/Time   IRON 23 (L) 06/03/2019 1508   TIBC 421 06/03/2019 1508   FERRITIN 9 (L) 06/03/2019 1508   IRONPCTSAT 5 (L) 06/03/2019 1508   Patient has failed these meds in past: none Patient is currently controlled on the following medications:   Ferrous sulfate 325 mg -  BID  We discussed: doing well, denies constipation  Plan: Continue current medications  Subclinical Hypothyroidism (no diagnosis in chart)   TSH  Date Value Ref Range  Status  02/25/2019 3.968 0.350 - 4.500 uIU/mL Final    Comment:    Performed by a 3rd Generation assay with a functional sensitivity of <=0.01 uIU/mL. Performed at [facility], [place] [address] [place],  [address]     Patient has failed these meds in past: none  Patient is currently controlled on the following medications:   Levothyroxine 25 mcg - 1 tablet before breakfast  We discussed: discussed indication; pt is doing well on current dose, takes at bedtime, started by PCP 06/2018  Plan: Continue current medications   Chronic Kidney Disease  GFR: 37 ml/min (06/21/19) CrCl: 32 ml/min (Scr: 1.4, Wt: 146) Patient has failed these meds in past: none Patient is currently on the following medications:   No pharmacotherapy  We discussed: medication safe for current renal function  Plan: No medication changes indicated  Vaccines   Reviewed and discussed patient's vaccination history.    Immunization History  Administered Date(s) Administered  . Influenza Split 03/21/2011, 02/14/2012  . Influenza Whole 04/10/2009, 04/17/2010  . Influenza,inj,Quad PF,6+ Mos 03/24/2013, 01/14/2014, 02/20/2015, 02/12/2016, 02/07/2017, 03/19/2018  . PFIZER SARS-COV-2 Vaccination 06/15/2019, 07/06/2019  . Pneumococcal Conjugate-13 03/06/2015  . Pneumococcal Polysaccharide-23 04/21/2001  . Td 01/25/2004  . Tdap 08/17/2010  . Zoster 04/10/2009   Plan: Recommend patient receive Shingrix from local pharmacy.  Medication Management  Misc: Premarin vaginal cream - daily, doxycycline 100 mg - 1 tablet BID (will finish 08/26/19 - pain on top of foot, rx per orthopedics), hydrocortisone cream  OTCs: Tylenol 500 mg - PRN, bisacodyl 5 mg - PRN (no problems lately), cetirizine --> switched to loratadine 10 mg this week,  cranberry 500 mg - 5 gummies daily, Flonase - daily, vitamin C 250 mg - 2 gummies daily, vitamin D 50 mcg - 2 gummies daily  Pharmacy/Benefits: [facility]  Adherence: lines everything up and keeps organized; very rarely missed doses   Affordability: no concerns   CCM Follow Up: 11/10/19 at 2:00 PM (telephone)  Debbora Dus, PharmD Clinical Pharmacist [facility] at [facility] [phone]

## 2019-08-07 NOTE — Telephone Encounter (Signed)
I would like to request a referral for [name] to chronic care management pharmacy services for the following conditions:   Essential hypertension, benign  [I10]  Permanent Atrial fibrillation [I48.21]  Debbora Dus, PharmD Clinical Pharmacist [facility] at [facility] [phone]

## 2019-08-09 ENCOUNTER — Other Ambulatory Visit: Payer: Self-pay

## 2019-08-09 ENCOUNTER — Ambulatory Visit: Payer: PPO

## 2019-08-09 ENCOUNTER — Encounter (HOSPITAL_COMMUNITY): Payer: PPO

## 2019-08-09 ENCOUNTER — Other Ambulatory Visit: Payer: Self-pay | Admitting: Family Medicine

## 2019-08-09 DIAGNOSIS — I10 Essential (primary) hypertension: Secondary | ICD-10-CM

## 2019-08-09 DIAGNOSIS — J309 Allergic rhinitis, unspecified: Secondary | ICD-10-CM

## 2019-08-09 DIAGNOSIS — I4821 Permanent atrial fibrillation: Secondary | ICD-10-CM

## 2019-08-09 DIAGNOSIS — D509 Iron deficiency anemia, unspecified: Secondary | ICD-10-CM

## 2019-08-09 DIAGNOSIS — I739 Peripheral vascular disease, unspecified: Secondary | ICD-10-CM

## 2019-08-09 DIAGNOSIS — J432 Centrilobular emphysema: Secondary | ICD-10-CM

## 2019-08-09 DIAGNOSIS — I5032 Chronic diastolic (congestive) heart failure: Secondary | ICD-10-CM

## 2019-08-09 DIAGNOSIS — E782 Mixed hyperlipidemia: Secondary | ICD-10-CM

## 2019-08-09 DIAGNOSIS — N184 Chronic kidney disease, stage 4 (severe): Secondary | ICD-10-CM

## 2019-08-09 DIAGNOSIS — I272 Pulmonary hypertension, unspecified: Secondary | ICD-10-CM

## 2019-08-09 DIAGNOSIS — F4322 Adjustment disorder with anxiety: Secondary | ICD-10-CM

## 2019-08-09 DIAGNOSIS — E538 Deficiency of other specified B group vitamins: Secondary | ICD-10-CM

## 2019-08-09 NOTE — Progress Notes (Signed)
Referral made to Chronic Care Management.

## 2019-08-09 NOTE — Telephone Encounter (Signed)
I did not realize there was a pended order, I entered new order.

## 2019-08-09 NOTE — Telephone Encounter (Signed)
Debbie, please sign referral.

## 2019-08-09 NOTE — Patient Instructions (Addendum)
August 09, 2019  Dear [name],  It was a pleasure meeting you during our initial appointment on August 09, 2019. Below is a summary of the goals we discussed and components of chronic care management. Please contact me anytime with questions or concerns.   Visit Information  Goals Addressed            This Visit's Progress   . Pharmacy Care Plan       CARE PLAN ENTRY  Current Barriers:  . Chronic Disease Management support, education, and care coordination needs related to hypertension, afib, hyperlipidemia, heart failure, allergies, iron deficiency anemia, anxiety, PAD, CAD, COPD, CKD with right neprectomy, B12 deficiency, urge incontinence  Pharmacist Clinical Goal(s):  Marland Kitchen Medications reviewed for safety with current kidney function and drug interactions. Separate doxycycline from levothyroxine, iron, and magnesium to prevent drug interaction. No medication changes indicated.  . Remain up to date on vaccinations. Recommend checking price on shingles 2-dose vaccine series (Shingrix) from local pharmacy.   Interventions: . Comprehensive medication review performed  Patient Self Care Activities:  . Self administers medications as prescribed . Self-monitors blood pressure   Initial goal documentation      [name] was given information about Chronic Care Management services today including:  1. CCM service includes personalized support from designated clinical staff supervised by her physician, including individualized plan of care and coordination with other care providers 2. 24/7 contact phone numbers for assistance for urgent and routine care needs. 3. Standard insurance, coinsurance, copays and deductibles apply for chronic care management only during months in which we provide at least 20 minutes of these services. Most insurances cover these services at 100%, however patients may be responsible for any copay, coinsurance and/or deductible if applicable. This service  may help you avoid the need for more expensive face-to-face services. 4. Only one practitioner may furnish and bill the service in a calendar month. 5. The patient may stop CCM services at any time (effective at the end of the month) by phone call to the office staff.  Patient agreed to services and verbal consent obtained.   The patient verbalized understanding of instructions provided today and agreed to receive a mailed copy of patient instruction and/or educational materials. Telephone follow up appointment with pharmacy team member scheduled for: 11/10/19 at 2:00 PM (telephone)  Debbora Dus, PharmD Clinical Pharmacist [facility] at [facility] [phone]  Zoster Vaccine, Recombinant injection What is this medicine? ZOSTER VACCINE (ZOS ter vak SEEN) is used to prevent shingles in adults 84 years old and over. This vaccine is not used to treat shingles or nerve pain from shingles. This medicine may be used for other purposes; ask your health care provider or pharmacist if you have questions. COMMON BRAND NAME(S): [facility] What should I tell my health care provider before I take this medicine? They need to know if you have any of these conditions:  blood disorders or disease  cancer like leukemia or lymphoma  immune system problems or therapy  an unusual or allergic reaction to vaccines, other medications, foods, dyes, or preservatives  pregnant or trying to get pregnant  breast-feeding How should I use this medicine? This vaccine is for injection in a muscle. It is given by a health care professional. Talk to your pediatrician regarding the use of this medicine in children. This medicine is not approved for use in children. Overdosage: If you think you have taken too much of this medicine contact a poison control  center or emergency room at once. NOTE: This medicine is only for you. Do not share this medicine with others. What if I miss a dose? Keep  appointments for follow-up (booster) doses as directed. It is important not to miss your dose. Call your doctor or health care professional if you are unable to keep an appointment. What may interact with this medicine?  medicines that suppress your immune system  medicines to treat cancer  steroid medicines like prednisone or cortisone This list may not describe all possible interactions. Give your health care provider a list of all the medicines, herbs, non-prescription drugs, or dietary supplements you use. Also tell them if you smoke, drink alcohol, or use illegal drugs. Some items may interact with your medicine. What should I watch for while using this medicine? Visit your doctor for regular check ups. This vaccine, like all vaccines, may not fully protect everyone. What side effects may I notice from receiving this medicine? Side effects that you should report to your doctor or health care professional as soon as possible:  allergic reactions like skin rash, itching or hives, swelling of the face, lips, or tongue  breathing problems Side effects that usually do not require medical attention (report these to your doctor or health care professional if they continue or are bothersome):  chills  headache  fever  nausea, vomiting  redness, warmth, pain, swelling or itching at site where injected  tiredness This list may not describe all possible side effects. Call your doctor for medical advice about side effects. You may report side effects to FDA at 1-800-FDA-1088. Where should I keep my medicine? This vaccine is only given in a clinic, pharmacy, doctor's office, or other health care setting and will not be stored at home. NOTE: This sheet is a summary. It may not cover all possible information. If you have questions about this medicine, talk to your doctor, pharmacist, or health care provider.  2020 Elsevier/Gold Standard (2016-12-02 13:20:30)

## 2019-08-10 ENCOUNTER — Telehealth: Payer: Self-pay | Admitting: Family Medicine

## 2019-08-10 DIAGNOSIS — Z87891 Personal history of nicotine dependence: Secondary | ICD-10-CM | POA: Diagnosis not present

## 2019-08-10 DIAGNOSIS — Z7901 Long term (current) use of anticoagulants: Secondary | ICD-10-CM | POA: Diagnosis not present

## 2019-08-10 DIAGNOSIS — Z905 Acquired absence of kidney: Secondary | ICD-10-CM | POA: Diagnosis not present

## 2019-08-10 DIAGNOSIS — N183 Chronic kidney disease, stage 3 unspecified: Secondary | ICD-10-CM | POA: Diagnosis not present

## 2019-08-10 DIAGNOSIS — I4821 Permanent atrial fibrillation: Secondary | ICD-10-CM | POA: Diagnosis not present

## 2019-08-10 DIAGNOSIS — Z95810 Presence of automatic (implantable) cardiac defibrillator: Secondary | ICD-10-CM | POA: Diagnosis not present

## 2019-08-10 DIAGNOSIS — J449 Chronic obstructive pulmonary disease, unspecified: Secondary | ICD-10-CM | POA: Diagnosis not present

## 2019-08-10 DIAGNOSIS — I130 Hypertensive heart and chronic kidney disease with heart failure and stage 1 through stage 4 chronic kidney disease, or unspecified chronic kidney disease: Secondary | ICD-10-CM | POA: Diagnosis not present

## 2019-08-10 DIAGNOSIS — D649 Anemia, unspecified: Secondary | ICD-10-CM | POA: Diagnosis not present

## 2019-08-10 DIAGNOSIS — I5033 Acute on chronic diastolic (congestive) heart failure: Secondary | ICD-10-CM | POA: Diagnosis not present

## 2019-08-10 NOTE — Telephone Encounter (Signed)
Please call patient and let her know that the cardiology office is going to run a report to see how her fluid level is. They have recommended that she have a blood count done. She can have it done at cardiology or here, whichever is easiest for her.

## 2019-08-11 ENCOUNTER — Other Ambulatory Visit: Payer: Self-pay | Admitting: Family Medicine

## 2019-08-11 ENCOUNTER — Encounter: Payer: Self-pay | Admitting: Physician Assistant

## 2019-08-11 ENCOUNTER — Telehealth (HOSPITAL_COMMUNITY): Payer: Self-pay | Admitting: Cardiology

## 2019-08-11 ENCOUNTER — Ambulatory Visit: Payer: PPO | Admitting: Physician Assistant

## 2019-08-11 ENCOUNTER — Other Ambulatory Visit: Payer: Self-pay

## 2019-08-11 ENCOUNTER — Other Ambulatory Visit (INDEPENDENT_AMBULATORY_CARE_PROVIDER_SITE_OTHER): Payer: PPO

## 2019-08-11 DIAGNOSIS — R42 Dizziness and giddiness: Secondary | ICD-10-CM | POA: Diagnosis not present

## 2019-08-11 DIAGNOSIS — N184 Chronic kidney disease, stage 4 (severe): Secondary | ICD-10-CM

## 2019-08-11 DIAGNOSIS — I872 Venous insufficiency (chronic) (peripheral): Secondary | ICD-10-CM

## 2019-08-11 LAB — CBC WITH DIFFERENTIAL/PLATELET
Basophils Absolute: 0 10*3/uL (ref 0.0–0.1)
Basophils Relative: 0.7 % (ref 0.0–3.0)
Eosinophils Absolute: 0.1 10*3/uL (ref 0.0–0.7)
Eosinophils Relative: 1.8 % (ref 0.0–5.0)
HCT: 32.8 % — ABNORMAL LOW (ref 36.0–46.0)
Hemoglobin: 10.8 g/dL — ABNORMAL LOW (ref 12.0–15.0)
Lymphocytes Relative: 23.7 % (ref 12.0–46.0)
Lymphs Abs: 0.9 10*3/uL (ref 0.7–4.0)
MCHC: 32.8 g/dL (ref 30.0–36.0)
MCV: 79.5 fl (ref 78.0–100.0)
Monocytes Absolute: 0.7 10*3/uL (ref 0.1–1.0)
Monocytes Relative: 18.3 % — ABNORMAL HIGH (ref 3.0–12.0)
Neutro Abs: 2.1 10*3/uL (ref 1.4–7.7)
Neutrophils Relative %: 55.5 % (ref 43.0–77.0)
Platelets: 206 10*3/uL (ref 150.0–400.0)
RBC: 4.13 Mil/uL (ref 3.87–5.11)
RDW: 27.2 % — ABNORMAL HIGH (ref 11.5–15.5)
WBC: 3.8 10*3/uL — ABNORMAL LOW (ref 4.0–10.5)

## 2019-08-11 LAB — BASIC METABOLIC PANEL
BUN: 49 mg/dL — ABNORMAL HIGH (ref 6–23)
CO2: 38 mEq/L — ABNORMAL HIGH (ref 19–32)
Calcium: 9.5 mg/dL (ref 8.4–10.5)
Chloride: 91 mEq/L — ABNORMAL LOW (ref 96–112)
Creatinine, Ser: 1.44 mg/dL — ABNORMAL HIGH (ref 0.40–1.20)
GFR: 34.73 mL/min — ABNORMAL LOW (ref >60.00)
Glucose, Bld: 71 mg/dL (ref 70–99)
Potassium: 3.3 mEq/L — ABNORMAL LOW (ref 3.5–5.1)
Sodium: 136 mEq/L (ref 135–145)

## 2019-08-11 NOTE — Telephone Encounter (Signed)
I've placed orders, thanks.

## 2019-08-11 NOTE — Telephone Encounter (Signed)
Cardio Mems reading is 15 (normal is 19)  Per [name] Simmons,PA Patient was advised to hold diuretics x 2 days Will have repeat labs done at PCP 4/7 @12    Patient aware and voiced understanding Patient reports increased dizziness has been going on over the past two weeks, to the point she brought her walker out for safety

## 2019-08-11 NOTE — Progress Notes (Signed)
Office Visit Note   Patient: [name]           Date of Birth: 12/30/1933           MRN: [id number] Visit Date: 08/11/2019              Requested by: Elby Beck, [place],  [place] [address] PCP: Elby Beck, FNP  No chief complaint on file.     HPI: Patient presents in follow-up today for her bilateral lower extremity venous stasis disease.  She comes in today wearing her compression stockings.  She feels they are being helpful.  Assessment & Plan: Visit Diagnoses: No diagnosis found.  Plan: She will continue to use the stockings and obtain another pair.  She will follow-up in 3 weeks.  Sooner if she is having any issues  Follow-Up Instructions: No follow-ups on file.   Ortho Exam  Patient is alert, oriented, no adenopathy, well-dressed, normal affect, normal respiratory effort. Bilateral lower extremities no new ulcers resolving venous stasis areas debrided and reveal healthy skin beneath.  Swelling is well controlled.  Compartments are soft and nontender no cellulitis no drainage  Imaging: No results found. No images are attached to the encounter.  Labs: Lab Results  Component Value Date   HGBA1C 6.1 (H) 08/17/2014   HGBA1C 5.6 01/14/2014   ESRSEDRATE 2 02/10/2012   REPTSTATUS 11/06/2018 FINAL 11/04/2018   GRAMSTAIN NO WBC SEEN NO ORGANISMS SEEN  02/21/2016   CULT (A) 11/04/2018    <10,000 COLONIES/mL INSIGNIFICANT GROWTH Performed at [facility], [place] [address] [place], [place] [address]    LABORGA ESCHERICHIA COLI 12/24/2016     Lab Results  Component Value Date   ALBUMIN 3.7 05/06/2019   ALBUMIN 3.0 (L) 03/03/2019   ALBUMIN 3.0 (L) 03/02/2019    Lab Results  Component Value Date   MG 2.1 03/10/2019   MG 2.1 03/08/2019   MG 2.4 03/05/2019   No results found for: VD25OH  No results found for: PREALBUMIN CBC EXTENDED Latest Ref Rng & Units 06/21/2019 06/03/2019 05/06/2019  WBC 4.0 - 10.5 K/uL  4.5 3.8(L) 3.6(L)  RBC 3.87 - 5.11 MIL/uL 4.74 4.03 3.74(L)  HGB 12.0 - 15.0 g/dL 10.2(L) 8.2(L) 8.2 Repeated and verified X2.(L)  HCT 36.0 - 46.0 % 37.3 29.4(L) 27.5(L)  PLT 150 - 400 K/uL 233 266 270.0  NEUTROABS 1.4 - 7.7 K/uL - - 2.5  LYMPHSABS 0.7 - 4.0 K/uL - - 0.5(L)     There is no height or weight on file to calculate BMI.  Orders:  No orders of the defined types were placed in this encounter.  No orders of the defined types were placed in this encounter.    Procedures: No procedures performed  Clinical Data: No additional findings.  ROS:  All other systems negative, except as noted in the HPI. Review of Systems  Objective: Vital Signs: There were no vitals taken for this visit.  Specialty Comments:  No specialty comments available.  PMFS History: Patient Active Problem List   Diagnosis Date Noted  . Chronic venous hypertension w ulceration ([place])   . Melena   . AKI (acute kidney injury) ([place]) 03/03/2019  . Symptomatic anemia 02/25/2019  . Acute on chronic diastolic CHF (congestive heart failure), NYHA class 4 ([place]) 11/04/2018  . Acute blood loss anemia 11/04/2018  . Chronic bilateral pleural effusions 11/04/2018  . Shortness of breath 10/21/2018  . Displaced fracture of left  femoral neck ([place]) 10/09/2018  . Somnolence   . Closed left hip fracture ([place]) 10/07/2018  . Fall   . Hypokalemia   . Afib ([place]) 06/01/2018  . Urinary frequency 09/18/2017  . Iliac artery stenosis, bilateral ([place]) 07/14/2017  . Recurrent UTI 07/14/2017  . Long term (current) use of anticoagulants 05/08/2017  . Anemia 11/18/2016  . Dysuria 11/18/2016  . Pulmonary hypertension ([place]) 12/04/2015  . B12 deficiency 05/22/2015  . PAD (peripheral artery disease) ([place])   . Elevated serum creatinine   . Renal cell carcinoma ([place])   . S/p nephrectomy   . TIA (transient ischemic attack) 08/16/2014  . Weakness 01/14/2014  . Chronic kidney disease (CKD), stage IV (severe) ([place])  12/08/2013  . Preoperative cardiovascular examination 06/07/2013  . Chronic diastolic CHF (congestive heart failure) ([place]) 06/04/2013  . Mitral valve regurgitation 06/04/2013  . Allergic rhinitis 01/01/2013  . Chronic cholecystitis with calculus 09/15/2012  . Claudication ([place]) 01/21/2012  . Dizziness 11/26/2011  . Hypertension 05/20/2011  . Avascular necrosis of hip ([place]) 05/03/2011  . OTHER AND UNSPECIFIED COAGULATION DEFECTS 10/13/2009  . RECTAL BLEEDING 10/13/2009  . HOCM / IHSS 07/14/2009  . EDEMA 07/14/2009  . Hyperlipidemia 04/10/2009  . ADJUSTMENT DISORDER WITH ANXIETY 04/10/2009  . INCONTINENCE, URGE 04/10/2009  . Permanent atrial fibrillation 10/27/2008  . SINUS BRADYCARDIA 10/27/2008  . Carotid artery stenosis 10/27/2008  . COPD (chronic obstructive pulmonary disease) ([place]) 10/27/2008  . ICD (implantable cardioverter-defibrillator) in place 10/27/2008   Past Medical History:  Diagnosis Date  . Adjustment disorder with anxiety   . Anginal pain ([place])   . Arthritis    "some in my hands" (11/11/2012)  . Atrial fibrillation ([place])   . Automatic implantable cardioverter-defibrillator in situ   . Blood in stool   . Carotid artery stenosis 09/2007   a. 09/2007: 60-79% bilateral (stable); b. 10/2008: 40-59% R 60-79%   . Chest pain, unspecified   . Chronic airway obstruction, not elsewhere classified   . Chronic bronchitis ([place])    "get it some; not q year" (11/11/2012)  . Chronic diastolic CHF (congestive heart failure) ([place]) 06/04/2013  . Chronic kidney disease, unspecified   . Coronary artery disease    non-obstructive by 2006 cath  . Frequent UTI    "get them a couple times/yr" (11/11/2012)  . Hemorrhage of rectum and anus   . High cholesterol   . History of blood transfusion 04/2011   "after hip OR" (11/11/2012)  . Hypertension 05/20/2011  . Hypertr obst cardiomyop   . Hypotension, unspecified    cardiac cath 2006..nonobstructive CAD 30-40s lesions.Marland KitchenETT 1/09 nondiagnostic  due to poor HR response..Right Renal Cancer 2003  . Long term (current) use of anticoagulants   . Malignant neoplasm of kidney, except pelvis   . Osteoarthritis of right hip   . Other and unspecified coagulation defects   . PONV (postoperative nausea and vomiting)   . Renal cancer ([place]) 06/2001   Right  . Secondary cardiomyopathy, unspecified   . Sinus bradycardia   . Urge incontinence     Family History  Problem Relation Age of Onset  . Heart failure Mother   . Breast cancer Maternal Aunt 82  . Breast cancer Cousin   . Breast cancer Other   . Colon cancer Neg Hx     Past Surgical History:  Procedure Laterality Date  . ABDOMINAL HYSTERECTOMY  1975   for benign causes  . APPENDECTOMY    . BI-VENTRICULAR PACEMAKER UPGRADE  05/04/2010  . BIOPSY  02/28/2019   Procedure: BIOPSY;  Surgeon: Milus Banister, MD;  Location: [facility] ENDOSCOPY;  Service: Endoscopy;;  . CARDIAC CATHETERIZATION  2006  . CARDIOVERSION N/A 02/20/2018   Procedure: CARDIOVERSION;  Surgeon: Minna Merritts, MD;  Location: [place] ORS;  Service: Cardiovascular;  Laterality: N/A;  . CARDIOVERSION N/A 03/27/2018   Procedure: CARDIOVERSION;  Surgeon: Minna Merritts, MD;  Location: [facility] ORS;  Service: Cardiovascular;  Laterality: N/A;  . CATARACT EXTRACTION W/ INTRAOCULAR LENS  IMPLANT, BILATERAL  01/2006-02-2006  . CHOLECYSTECTOMY N/A 11/11/2012   Procedure: LAPAROSCOPIC CHOLECYSTECTOMY WITH INTRAOPERATIVE CHOLANGIOGRAM;  Surgeon: Imogene Burn. Georgette [place], MD;  Location: [place];  Service: General;  Laterality: N/A;  . COLONOSCOPY WITH PROPOFOL N/A 02/28/2019   Procedure: COLONOSCOPY WITH PROPOFOL;  Surgeon: Milus Banister, MD;  Location: [facility] ENDOSCOPY;  Service: Endoscopy;  Laterality: N/A;  . EP IMPLANTABLE DEVICE N/A 02/21/2016   Procedure: ICD Generator Changeout;  Surgeon: Deboraha Sprang, MD;  Location: [place] [facility];  Service: Cardiovascular;  Laterality: N/A;  . ESOPHAGOGASTRODUODENOSCOPY (EGD) WITH PROPOFOL N/A  02/28/2019   Procedure: ESOPHAGOGASTRODUODENOSCOPY (EGD) WITH PROPOFOL;  Surgeon: Milus Banister, MD;  Location[facility] ENDOSCOPY;  Service: Endoscopy;  Laterality: N/A;  . GIVENS CAPSULE STUDY N/A 03/15/2019   Procedure: GIVENS CAPSULE STUDY;  Surgeon: Thornton Park, MD;  Location: [place];  Service: Gastroenterology;  Laterality: N/A;  . INSERT / REPLACE / REMOVE PACEMAKER  05-01-11   02-28-05-/05-04-10-ICD-MEDTRONIC MAXIMAL DR  . JOINT REPLACEMENT    . LAPAROSCOPIC CHOLECYSTECTOMY  11/11/2012  . LAPAROSCOPIC LYSIS OF ADHESIONS N/A 11/11/2012   Procedure: LAPAROSCOPIC LYSIS OF ADHESIONS;  Surgeon: Imogene Burn. Georgette [place], MD;  Location: [place];  Service: General;  Laterality: N/A;  . NEPHRECTOMY Right 06/2001    S/P RENAL CELL CANCER  . PRESSURE SENSOR/CARDIOMEMS N/A 02/03/2019   Procedure: PRESSURE SENSOR/CARDIOMEMS;  Surgeon: Larey Dresser, MD;  Location: [place] [facility];  Service: Cardiovascular;  Laterality: N/A;  . RIGHT HEART CATH N/A 11/09/2018   Procedure: RIGHT HEART CATH;  Surgeon: Larey Dresser, MD;  Location: [place] [facility];  Service: Cardiovascular;  Laterality: N/A;  . RIGHT HEART CATH N/A 03/08/2019   Procedure: RIGHT HEART CATH;  Surgeon: Larey Dresser, MD;  Location: [place] [facility];  Service: Cardiovascular;  Laterality: N/A;  . SUBMUCOSAL TATTOO INJECTION  02/28/2019   Procedure: SUBMUCOSAL TATTOO INJECTION;  Surgeon: Milus Banister, MD;  Location[facility] ENDOSCOPY;  Service: Endoscopy;;  . TOTAL HIP ARTHROPLASTY Right 05/03/2011   Procedure: TOTAL HIP ARTHROPLASTY ANTERIOR APPROACH;  Surgeon: Mcarthur Rossetti;  Location: [facility] ORS;  Service: Orthopedics;  Laterality: Right;  Removal of Cannulated Screws Right Hip, Right Direct Anterior Hip Replacement  . TOTAL HIP ARTHROPLASTY Left 10/09/2018   Procedure: TOTAL HIP ARTHROPLASTY ANTERIOR APPROACH;  Surgeon: Rod Can, MD;  Location: [place];  Service: Orthopedics;  Laterality: Left;   Social History    Occupational History  . Occupation: Retired    Fish farm manager: RETIRED  Tobacco Use  . Smoking status: Former Smoker    Packs/day: 0.50    Years: 40.00    Pack years: 20.00    Types: Cigarettes    Quit date: 05/06/2001    Years since quitting: 18.2  . Smokeless tobacco: Never Used  Substance and Sexual Activity  . Alcohol use: No    Alcohol/week: 0.0 standard drinks  . Drug use: No  . Sexual activity: Never

## 2019-08-11 NOTE — Telephone Encounter (Signed)
Pt is coming at 12:45 today for labwork  What labs needs to be ordered? I can place them if you need me to.

## 2019-08-11 NOTE — Telephone Encounter (Signed)
-----   Message from Elby Beck, [place] sent at 08/10/2019  8:22 PM EDT ----- Thanks for the reply. Last CBC 06/21/19 was improved from previous.  I will have my office contact her to see which location is preferable for her.  Take care,  Debbie ----- Message ----- From: Consuelo Pandy, PA-C Sent: 08/10/2019   5:37 PM EDT To: Kerry Dory, CMA, #  Also recommend checking CBC to r/o anemia. She has a h/o GIB/ symptomatic anemia.   She has an implantable pulmonary artery device that helps to monitor her volume status. I will have our nursing staff run a report to see if she may be a bit dry. We will f/u with that. If her PA pressure is not high, then we can try having her hold her diuretics for a day or 2.   Thanks for letting us know. If she has not had a recent CBC, she can get done in your office or we can set up a lab appt in our clinic for her   ----- Message ----- From: Elby Beck, FNP Sent: 08/09/2019   5:28 PM EDT To: Brittainy Erie Noe, PA-C  Hi Brittainy,  I am PCP for [employer].  I saw her in the office the other week with vertigo. I had hoped that cleaning her ears would help but it did not. Checked orthostatic which were unremarkable. Recent labs reassuring. Her weight is down quite a bit and I wondered if she was a little dry. She is very compliant with fluid restriction (I think she said 4 bottles of water daily). She reports some increased thirst and drank a little more yesterday with slight improvement today.  What do you think? She has an upcoming appointment 08/17/19 with CHF clinic. She is quite symptomatic and I wanted to get your opinion. If you think her fluid status is ok, I will send her to vestibular pt.  Thanks so much for your help,  Tor Netters, FNP-BC

## 2019-08-11 NOTE — Telephone Encounter (Signed)
Spoke with patient - she has an appt at 2pm today so she is going to work out a time to come by today to have this done, will call back to schedule this at our office.

## 2019-08-17 ENCOUNTER — Other Ambulatory Visit: Payer: Self-pay

## 2019-08-17 ENCOUNTER — Encounter (HOSPITAL_COMMUNITY): Payer: Self-pay

## 2019-08-17 ENCOUNTER — Ambulatory Visit (HOSPITAL_COMMUNITY)
Admission: RE | Admit: 2019-08-17 | Discharge: 2019-08-17 | Disposition: A | Discharge status: Home / Self Care | Payer: PPO | Source: Ambulatory Visit | Attending: Cardiology | Admitting: Cardiology

## 2019-08-17 VITALS — BP 158/62 | HR 70 | Wt 147.4 lb

## 2019-08-17 DIAGNOSIS — I4821 Permanent atrial fibrillation: Secondary | ICD-10-CM | POA: Diagnosis not present

## 2019-08-17 DIAGNOSIS — Z87891 Personal history of nicotine dependence: Secondary | ICD-10-CM | POA: Insufficient documentation

## 2019-08-17 DIAGNOSIS — Z79899 Other long term (current) drug therapy: Secondary | ICD-10-CM | POA: Diagnosis not present

## 2019-08-17 DIAGNOSIS — R42 Dizziness and giddiness: Secondary | ICD-10-CM | POA: Diagnosis not present

## 2019-08-17 DIAGNOSIS — G35 Multiple sclerosis: Secondary | ICD-10-CM | POA: Diagnosis not present

## 2019-08-17 DIAGNOSIS — I2510 Atherosclerotic heart disease of native coronary artery without angina pectoris: Secondary | ICD-10-CM | POA: Diagnosis not present

## 2019-08-17 DIAGNOSIS — D509 Iron deficiency anemia, unspecified: Secondary | ICD-10-CM | POA: Insufficient documentation

## 2019-08-17 DIAGNOSIS — Z95810 Presence of automatic (implantable) cardiac defibrillator: Secondary | ICD-10-CM | POA: Insufficient documentation

## 2019-08-17 DIAGNOSIS — I5032 Chronic diastolic (congestive) heart failure: Secondary | ICD-10-CM | POA: Diagnosis not present

## 2019-08-17 DIAGNOSIS — N183 Chronic kidney disease, stage 3 unspecified: Secondary | ICD-10-CM | POA: Diagnosis not present

## 2019-08-17 DIAGNOSIS — I428 Other cardiomyopathies: Secondary | ICD-10-CM | POA: Diagnosis not present

## 2019-08-17 DIAGNOSIS — I130 Hypertensive heart and chronic kidney disease with heart failure and stage 1 through stage 4 chronic kidney disease, or unspecified chronic kidney disease: Secondary | ICD-10-CM | POA: Diagnosis not present

## 2019-08-17 DIAGNOSIS — I5042 Chronic combined systolic (congestive) and diastolic (congestive) heart failure: Secondary | ICD-10-CM | POA: Diagnosis not present

## 2019-08-17 DIAGNOSIS — E785 Hyperlipidemia, unspecified: Secondary | ICD-10-CM | POA: Diagnosis not present

## 2019-08-17 DIAGNOSIS — Z7901 Long term (current) use of anticoagulants: Secondary | ICD-10-CM | POA: Diagnosis not present

## 2019-08-17 DIAGNOSIS — J9611 Chronic respiratory failure with hypoxia: Secondary | ICD-10-CM | POA: Insufficient documentation

## 2019-08-17 DIAGNOSIS — I272 Pulmonary hypertension, unspecified: Secondary | ICD-10-CM | POA: Diagnosis not present

## 2019-08-17 DIAGNOSIS — Z8249 Family history of ischemic heart disease and other diseases of the circulatory system: Secondary | ICD-10-CM | POA: Diagnosis not present

## 2019-08-17 LAB — BASIC METABOLIC PANEL
Anion gap: 12 (ref 5–15)
BUN: 34 mg/dL — ABNORMAL HIGH (ref 8–23)
CO2: 34 mmol/L — ABNORMAL HIGH (ref 22–32)
Calcium: 9.4 mg/dL (ref 8.9–10.3)
Chloride: 91 mmol/L — ABNORMAL LOW (ref 98–111)
Creatinine, Ser: 1.4 mg/dL — ABNORMAL HIGH (ref 0.44–1.00)
GFR calc Af Amer: 40 mL/min — ABNORMAL LOW (ref >60)
GFR calc non Af Amer: 35 mL/min — ABNORMAL LOW (ref >60)
Glucose, Bld: 95 mg/dL (ref 70–99)
Potassium: 3.4 mmol/L — ABNORMAL LOW (ref 3.5–5.1)
Sodium: 137 mmol/L (ref 135–145)

## 2019-08-17 MED ORDER — METOLAZONE 5 MG PO TABS: 5.0000 mg | ORAL_TABLET | ORAL | 3 refills | Status: DC

## 2019-08-17 NOTE — Patient Instructions (Signed)
Decrease Metolazone to only 2 days a week  Keep a check on your BP and let us know if it is running 140/90 or higher  Labs done today, your results will be available in MyChart, we will contact you for abnormal readings.  Your physician recommends that you schedule a follow-up appointment in: 4-6 weeks  If you have any questions or concerns before your next appointment please send [place] a message through [place] or call our office at [phone].  At the [facility], you and your health needs are our priority. As part of our continuing mission to provide you with exceptional heart care, we have created designated Provider Care Teams. These Care Teams include your primary Cardiologist (physician) and Advanced Practice Providers (APPs- Physician Assistants and Nurse Practitioners) who all work together to provide you with the care you need, when you need it.   You may see any of the following providers on your designated Care Team at your next follow up: Marland Kitchen Dr Glori Bickers . Dr Loralie Champagne . Darrick Grinder, NP . Lyda Jester, PA . Audry Riles, PharmD   Please be sure to bring in all your medications bottles to every appointment.

## 2019-08-17 NOTE — Progress Notes (Signed)
PCP: Elby Beck, FNP Cardiology: Dr. Rockey Situ HF Cardiology: Dr. [name] Ortho: Dr Sharol Given   Reason for Visit: f/u for chronic diastolic HF, diuretic management   Ms [name] is a 86 year old with a history of permanent atrial fibrillation, chronic diastolic CHF, CKD stage 3, smoking/prior COPD, renal cell CA s/p right nephrectomy.  She has a prior history of HFrEF with nonischemic cardiomyoapthy and had a Medtronic ICD placed.  However, subsequently her EF has recovered.  More recently, she has had symptomatic HFpEF.    She has had dyspnea and edema for "a long time," but it has been worse since January of this year. In 6/20, she had left hip ORIF after mechanical fall.  Echo in 6/20 showed EF 60-65%, mild RV dilation with normal systolic function, PASP 93 mmHg.  After she got home from the hospital stay post-ORIF, she noted marked peripheral edema.  She was short of breath walking short distances.  +orthopnea.  She came back to the hospital on 11/04/18 and was admitted with volume overload.  She was diuresed aggressively and lost about 22 lbs.  RHC showed pulmonary venous hypertension.  PCWP tracing had prominent v-waves in absence of significant MR, suggesting significant diastolic dysfunction.   She has been noted to have significant Fe deficiency anemia, but FOBT was negative during 7/20 hospitalization.   She was admitted in 11/20 with symptomatic anemia, GI workup concerning for ischemic bowel.  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 Doren Custard with [facility] and conservative management recommended.  Capsule endoscopy showed no definite source of bleeding.  She was volume overloaded due to significant RV dysfunction and diuresed, she also developed AKI on CKD stage 3.     Had recent return visit on 2/8. Overall feeling fair but was SOB with exertion + Orthopnea, w/ ongoing leg edema. Admitted to dietary indiscretion. Eating soup and drinking lots of water.  ReDs clip  measurement was elevated at 44%. Diuretics were adjusted. Torsemide was increased to 100 mg bid and metolazone was increased to Tues-Thur- Sat. Walk test was also done in clinic and she was noted to be hypoxic w/ ambulation. Home O2 ordered. She also was set up for feraheme injections and had her second dose last week.    She was doing fairly well until recently. Last week, she developed dizziness. Was evaluated by her PCP and felt to be dehydrated. CBC was checked and showed stable Hgb. BMP showed stable SCr at 1.4 but elevated BUN from 29>>49 c/w dehydration. [facility] was contacted regarding diuretic management. cardiomems report was reviewed and her diastolic PA pressure was -11 pts below recommend goal.   She was advised to hold her diuretics x 2 days then to resume. She now presents to clinic for f/u. She is feeling better but continues to have occasional dizziness if she stands too quickly but no syncope/ near syncope. Cardiomems report shows diastolic PA pressure to be -1 below recommended goal. She denies exertional dyspnea. No orthopnea/ PND. Her legs are markedly improved compared to prior exams. She has no pitting edema and venous stasis ulcers are healing. BP mildly elevated today in the 268T systolic, but she only took 1/2 dose of her torsemide this morning to prevent urinary frequency given today's clinic visit. We checked orthostatic VS in clinic today and they were negative.    Labs (7/20): K 3.7, creatinine 1.44 => 1.33 Labs (12/20): pro-BNP 431, K 4.4, creatinine 1.24, hgb 8.2 Labs (06/02/18): Iron Sat 5%,  K 4.4, creatinine 1.65 Labs (08/11/19): SCr 1.44, BUN 49, K 3.3, Hgb 10.8   PMH: 1. COPD 2. Renal cell carcinoma: S/p right kidney resection.  3. CKD stage 3 4. Fe deficiency anemia 5. Atrial fibrillation: Permanent. She has failed amiodarone, Tikosyn, and flecainide as well as multiple cardioversions. 6. Chronic diastolic CHF: She has history of prior HFrEF with nonischemic  cardiomyopathy.  She has a Medtronic ICD.   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  - More recently, EF has been in normal range but she has had diastolic CHF.  - Echo (3/30): EF 60-65%, mildly dilated RV with PASP 93 mmHg, mild-moderate TR.  - RHC (7/20): mean RA 11, PA 64/18 mean 39, mean PCWP 24 with v waves to 41, CI 4.21, PVR 1.9 WU.  - Cardiomems placement - RHC (11/20): mean RA 13, PA 63/23 mean 25 with v waves to 47 (MR only mild by echo), mean PCWP 25, CI 3.18, PVR 2.6 WU 7. Hyperlipidemia 8. Carotid stenosis: Carotid dopplers (0/76) with 22-63% LICA stenosis.   9. Possible ischemic bowel in 11/20 10. 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 Conservative management per Dr. Doren Custard.   Social History   Socioeconomic History  . Marital status: Widowed    Spouse name: Not on file  . Number of children: Not on file  . Years of education: Not on file  . Highest education level: Not on file  Occupational History  . Occupation: Retired    Fish farm manager: RETIRED  Tobacco Use  . Smoking status: Former Smoker    Packs/day: 0.50    Years: 40.00    Pack years: 20.00    Types: Cigarettes    Quit date: 05/06/2001    Years since quitting: 18.2  . Smokeless tobacco: Never Used  Substance and Sexual Activity  . Alcohol use: No    Alcohol/week: 0.0 standard drinks  . Drug use: No  . Sexual activity: Never  Other Topics Concern  . 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   ALCOHOL USE-NO   RETIRED   FORMER TOBACCO USE.....1/2 PACK X 40 YRS UNTIL 2003   Would desire CPR   Social Determinants of Health   Financial Resource Strain:   . Difficulty of Paying Living Expenses:   Food Insecurity:   . Worried About Charity fundraiser in the Last Year:   . Arboriculturist in the Last Year:     Transportation Needs:   . Film/video editor (Medical):   Marland Kitchen Lack of Transportation (Non-Medical):   Physical Activity:   . Days of Exercise per Week:   . Minutes of Exercise per Session:   Stress:   . Feeling of Stress :   Social Connections:   . Frequency of Communication with Friends and Family:   . Frequency of Social Gatherings with Friends and Family:   . Attends Religious Services:   . Active Member of Clubs or Organizations:   . Attends Archivist Meetings:   Marland Kitchen Marital Status:   Intimate Partner Violence:   . Fear of Current or Ex-Partner:   . Emotionally Abused:   Marland Kitchen Physically Abused:   . Sexually Abused:    Family History  Problem Relation Age of Onset  . Heart  failure Mother   . Breast cancer Maternal Aunt 82  . Breast cancer Cousin   . Breast cancer Other   . Colon cancer Neg Hx    ROS: All systems reviewed and negative except as per HPI.   Current Outpatient Medications  Medication Sig Dispense Refill  . acetaminophen (TYLENOL) 500 MG tablet Take 500-1,000 mg by mouth every 8 (eight) hours as needed for mild pain or headache.     . bisacodyl (DULCOLAX) 5 MG EC tablet Take 1 tablet (5 mg total) by mouth daily as needed for moderate constipation. 10 tablet 0  . cetirizine (ZYRTEC) 10 MG tablet Take 10 mg by mouth daily.     . Cholecalciferol (VITAMIN D) 50 MCG (2000 UT) CAPS Take 4,000 Units by mouth daily.    Marland Kitchen conjugated estrogens (PREMARIN) vaginal cream Place 1 Applicatorful vaginally daily. 42.5 g 12  . Cranberry (AZO CRANBERRY GUMMIES) 500 MG CHEW Chew 2,000 mg by mouth daily.    Marland Kitchen doxycycline (VIBRA-TABS) 100 MG tablet Take 1 tablet (100 mg total) by mouth 2 (two) times daily. 60 tablet 0  . ezetimibe (ZETIA) 10 MG tablet Take 1 tablet (10 mg total) by mouth daily. 30 tablet 11  . ferrous sulfate 325 (65 FE) MG tablet Take 1 tablet (325 mg total) by mouth 2 (two) times daily with a meal. 60 tablet 0  . fluticasone (FLONASE) 50 MCG/ACT nasal  spray Place 2 sprays into both nostrils daily as needed for allergies or rhinitis. 16 g 6  . hydrocortisone 2.5 % cream Apply 1 application topically daily as needed (to itchy areas).     Marland Kitchen levothyroxine (SYNTHROID) 25 MCG tablet TAKE 1 TABLET BY MOUTH ONCE DAILY BEFOREBREAKFAST 30 tablet 3  . magnesium oxide (MAG-OX) 400 MG tablet Take 1 tablet (400 mg total) by mouth daily. 30 tablet 2  . metolazone (ZAROXOLYN) 5 MG tablet Take 1 tablet (5 mg total) by mouth 3 (three) times a week. Tues, thurs, sat 15 tablet 3  . potassium chloride SA (KLOR-CON) 20 MEQ tablet Take 2 tablets (40 mEq total) by mouth 2 (two) times daily. Take 1 extra tab on Tue and Thur with metolazone 135 tablet 0  . rosuvastatin (CRESTOR) 40 MG tablet Take 1 tablet (40 mg total) by mouth daily. 90 tablet 3  . torsemide (DEMADEX) 100 MG tablet Take 1 tablet (100 mg total) by mouth 2 (two) times daily. 180 tablet 1  . vitamin C (ASCORBIC ACID) 250 MG tablet Take 500 mg by mouth daily.    Marland Kitchen warfarin (COUMADIN) 2 MG tablet Take 1 tablet (2 mg total) by mouth one time only at 6 PM for 1 dose. Directions for dosing per coumadin clinic. 90 tablet 1   No current facility-administered medications for this encounter.   BP (!) 158/62   Pulse 70   Wt 66.9 kg (147 lb 6.4 oz)   SpO2 97%   BMI 23.79 kg/m   Wt Readings from Last 3 Encounters:  08/17/19 66.9 kg (147 lb 6.4 oz)  07/28/19 66.2 kg (146 lb)  07/20/19 72.6 kg (160 lb)   PHYSICAL EXAM: General:  Elderly WF. No respiratory difficulty HEENT: normal Neck: supple. no JVD. Carotids 2+ bilat; no bruits. No lymphadenopathy or thyromegaly appreciated. Cor: PMI nondisplaced. Regular rate & rhythm. No rubs, gallops or murmurs. Lungs: clear, no wheezing  Abdomen: soft, nontender, nondistended. No hepatosplenomegaly. No bruits or masses. Good bowel sounds. Extremities: no cyanosis, clubbing, rash. + trace bilateral LEE, chronic  venous stasis dermatitis, previous venous stasis ulcers  are now well healed. + bilateral varicose veins.  Neuro: alert & oriented x 3, cranial nerves grossly intact. moves all 4 extremities w/o difficulty. Affect pleasant.    Assessment/Plan: 1. Chronic diastolic CHF: Echo in 9/40 showed EF 60-65%, mild RV dilation with normal systolic function, PASP 93 mmHg. She had remote nonischemic cardiomyopathy with recovery of EF, has Medtronic ICD. Suspect that there is significant RV dysfunction. Atrial fibrillation likely contributes, but this appears to be permanent now as she has failed multiple anti-arrhythmics and is reasonably rate-controlled. [place] 11/20 showed elevated filling pressures and pulmonary venous hypertension.  Prominent v-waves on PCWP tracing likely due to stiff ventricle/diastolic dysfunction as she had only mild MR on recent echo.  -volume status overall much improved. Recently felt to be mildly dehydrated and diuretics held x 2 days. Back on torsemide 100 mg bid + metolazone 3 days a week. Cardiomems report good. Diastolic PA pressure -1 point from recommended goal. Goal is 19. Today's reading is 18. She continues to have mild dizziness w/ standing but orthostatic VS negative.  - Will repeat BMP today to reassess SCr/BUN - Continue torsemide 100 mg bid.  - Reduce metolazone to 2 days a week for now. She will monitor wt closely and can take an extra 3rd tablet if needed. Will will continue to follow cardiomems report - Continue w/ compression stockings  - continue sodium restriction 2. Pulmonary hypertension: PA systolic pressure estimated 93 mmHg on echo. Based on [place] 11/20, she has primarily pulmonary venous hypertension.  - continue diuretics per above  3. CKD: Stage 3.  - check BMP today 4. Anemia: Fe deficiency. She denies overt bleeding.  Capsule endoscopy during 11/20 admission did not show bleeding source.  - CBC 4/7 showed stable hgb at 10.8 - Continue ferous sulfate 325 mg bid  5. Atrial fibrillation: Permanent now. Rate  controlled. She has failed amiodarone, Tikosyn, and flecainide as well as multiple cardioversions.  - Continue warfarin for anticoagulation  6. Chronic Hypoxic Respiratory Failure -  home O2 ordered.   F/u in 6 weeks    Sherl Yzaguirre Rosita Fire PA-C  08/17/2019

## 2019-08-25 ENCOUNTER — Telehealth (HOSPITAL_COMMUNITY): Payer: Self-pay | Admitting: Cardiology

## 2019-08-25 NOTE — Telephone Encounter (Signed)
-----   Message from Consuelo Pandy, [place] sent at 08/17/2019  6:00 PM EDT ----- SCr is stable. BUN is improving. Continue w/ plans to reduce metolazone to 2 days a week.   Her K is mildly low. Recommend that she take an EXTRA 40 mEQ of KCl on her torsemide days instead of only 20 mEQ. Repeat BMP in 7-10 days.

## 2019-08-25 NOTE — Telephone Encounter (Signed)
Pt aware and voiced understanding will have repeat labs done with [facility]

## 2019-08-27 LAB — POCT INR: INR: 2 (ref 2.0–3.0)

## 2019-08-31 ENCOUNTER — Ambulatory Visit: Payer: PPO | Admitting: Physician Assistant

## 2019-09-02 ENCOUNTER — Telehealth: Payer: Self-pay | Admitting: Family Medicine

## 2019-09-02 DIAGNOSIS — Z7689 Persons encountering health services in other specified circumstances: Secondary | ICD-10-CM | POA: Diagnosis not present

## 2019-09-02 NOTE — Telephone Encounter (Signed)
Haley with [facility] reported pt's INR as 2.0

## 2019-09-03 ENCOUNTER — Ambulatory Visit (INDEPENDENT_AMBULATORY_CARE_PROVIDER_SITE_OTHER): Payer: PPO

## 2019-09-03 DIAGNOSIS — Z7901 Long term (current) use of anticoagulants: Secondary | ICD-10-CM | POA: Diagnosis not present

## 2019-09-03 NOTE — Telephone Encounter (Signed)
See below

## 2019-09-03 NOTE — Telephone Encounter (Signed)
Routing to [facility] RN for management.

## 2019-09-03 NOTE — Telephone Encounter (Signed)
This has been documented in an anticoag encounter

## 2019-09-03 NOTE — Patient Instructions (Signed)
Pre visit review using our clinic review tool, if applicable. No additional management support is needed unless otherwise documented below in the visit note. 

## 2019-09-07 ENCOUNTER — Ambulatory Visit: Payer: PPO | Admitting: Physician Assistant

## 2019-09-08 ENCOUNTER — Telehealth (HOSPITAL_COMMUNITY): Payer: Self-pay | Admitting: *Deleted

## 2019-09-08 NOTE — Telephone Encounter (Signed)
Pt called stating her weight is up she is normally around 145lbs. This morning pts weight was 155lbs. Her last office visit 4/13 her weight was 147lbs.  Pt is SOB and she notices fluid in her abdomen.  Routed to [facility] for advice

## 2019-09-09 ENCOUNTER — Other Ambulatory Visit (HOSPITAL_COMMUNITY): Payer: Self-pay | Admitting: Cardiology

## 2019-09-09 ENCOUNTER — Ambulatory Visit (INDEPENDENT_AMBULATORY_CARE_PROVIDER_SITE_OTHER): Payer: PPO | Admitting: *Deleted

## 2019-09-09 DIAGNOSIS — I428 Other cardiomyopathies: Secondary | ICD-10-CM

## 2019-09-09 LAB — CUP PACEART REMOTE DEVICE CHECK
Battery Remaining Longevity: 47 mo
Battery Voltage: 2.97 V
Brady Statistic AP VP Percent: 0 %
Brady Statistic AP VS Percent: 0 %
Brady Statistic AS VP Percent: 0 %
Brady Statistic AS VS Percent: 0 %
Brady Statistic RA Percent Paced: 0 %
Brady Statistic RV Percent Paced: 42.88 %
Date Time Interrogation Session: 20210506122900
HighPow Impedance: 39 Ohm
HighPow Impedance: 48 Ohm
Implantable Lead Implant Date: 20061026
Implantable Lead Implant Date: 20111230
Implantable Lead Location: 753859
Implantable Lead Location: 753860
Implantable Lead Model: 5076
Implantable Lead Model: 6947
Implantable Pulse Generator Implant Date: 20171018
Lead Channel Impedance Value: 361 Ohm
Lead Channel Impedance Value: 456 Ohm
Lead Channel Impedance Value: 456 Ohm
Lead Channel Pacing Threshold Amplitude: 0.625 V
Lead Channel Pacing Threshold Pulse Width: 0.4 ms
Lead Channel Sensing Intrinsic Amplitude: 0.25 mV
Lead Channel Sensing Intrinsic Amplitude: 0.375 mV
Lead Channel Sensing Intrinsic Amplitude: 9.25 mV
Lead Channel Sensing Intrinsic Amplitude: 9.25 mV
Lead Channel Setting Pacing Amplitude: 2.5 V
Lead Channel Setting Pacing Pulse Width: 0.4 ms
Lead Channel Setting Sensing Sensitivity: 0.3 mV

## 2019-09-09 NOTE — Telephone Encounter (Signed)
Pt aware and agreeable with plan.  

## 2019-09-09 NOTE — Progress Notes (Signed)
Remote ICD transmission.   

## 2019-09-13 ENCOUNTER — Other Ambulatory Visit: Payer: Self-pay

## 2019-09-13 ENCOUNTER — Ambulatory Visit: Payer: PPO | Admitting: Physician Assistant

## 2019-09-13 ENCOUNTER — Encounter: Payer: Self-pay | Admitting: Physician Assistant

## 2019-09-13 VITALS — Ht 66.0 in | Wt 147.0 lb

## 2019-09-13 DIAGNOSIS — I872 Venous insufficiency (chronic) (peripheral): Secondary | ICD-10-CM

## 2019-09-13 NOTE — Progress Notes (Signed)
I have reviewed this visit and I agree on the patient's plan of dosage and recommendations. Zahi Plaskett B Zeric Baranowski, FNP   

## 2019-09-13 NOTE — Progress Notes (Signed)
Office Visit Note   Patient: [name]           Date of Birth: 1933-11-16           MRN: [id number] Visit Date: 09/13/2019              Requested by: Elby Beck, [place],  [place] [id number] PCP: Elby Beck, FNP  Chief Complaint  Patient presents with  . Right Leg - Follow-up  . Left Leg - Follow-up      HPI: This is a pleasant 86 year old woman who comes in for follow-up on her bilateral lower extremity venous insufficiency.  She has been wearing herVIVE compression stockings and finds them quite helpful.  She is very happy that she no longer has any ulcers on her legs.  She has been having some dizzy spells but has not fallen.  She does have an appointment scheduled with her primary care provider and cardiologist  Assessment & Plan: Visit Diagnoses: No diagnosis found.  Plan: She will follow up with her primary care doctor or cardiologist with regards to her dizzy spells.  She also complains of generalized swelling which her primary care or cardiologist will address.  I suggested that she try to get a sooner appointment and have told her if she has any difficulties to contact me  Follow-Up Instructions: No follow-ups on file.   Ortho Exam  Patient is alert, oriented, no adenopathy, well-dressed, normal affect, normal respiratory effort. Bilateral lower extremities no ulcers compartments are soft and compressible mild erythema across the front of her legs.  Significantly decreased swelling.  Ankle range of motion is excellent.  No cellulitis skin in good condition  Imaging: No results found. No images are attached to the encounter.  Labs: Lab Results  Component Value Date   HGBA1C 6.1 (H) 08/17/2014   HGBA1C 5.6 01/14/2014   ESRSEDRATE 2 02/10/2012   REPTSTATUS 11/06/2018 FINAL 11/04/2018   GRAMSTAIN NO WBC SEEN NO ORGANISMS SEEN  02/21/2016   CULT (A) 11/04/2018    <10,000 COLONIES/mL INSIGNIFICANT GROWTH Performed at  [facility], [place] [address], [place], [place] [address]    LABORGA ESCHERICHIA COLI 12/24/2016     Lab Results  Component Value Date   ALBUMIN 3.7 05/06/2019   ALBUMIN 3.0 (L) 03/03/2019   ALBUMIN 3.0 (L) 03/02/2019    Lab Results  Component Value Date   MG 2.1 03/10/2019   MG 2.1 03/08/2019   MG 2.4 03/05/2019   No results found for: VD25OH  No results found for: PREALBUMIN CBC EXTENDED Latest Ref Rng & Units 08/11/2019 06/21/2019 06/03/2019  WBC 4.0 - 10.5 K/uL 3.8(L) 4.5 3.8(L)  RBC 3.87 - 5.11 Mil/uL 4.13 4.74 4.03  HGB 12.0 - 15.0 g/dL 10.8(L) 10.2(L) 8.2(L)  HCT 36.0 - 46.0 % 32.8(L) 37.3 29.4(L)  PLT 150.0 - 400.0 K/uL 206.0 233 266  NEUTROABS 1.4 - 7.7 K/uL 2.1 - -  LYMPHSABS 0.7 - 4.0 K/uL 0.9 - -     Body mass index is 23.73 kg/m.  Orders:  No orders of the defined types were placed in this encounter.  No orders of the defined types were placed in this encounter.    Procedures: No procedures performed  Clinical Data: No additional findings.  ROS:  All other systems negative, except as noted in the HPI. Review of Systems  Objective: Vital Signs: Ht 5\' 6"  (1.676 m)   Wt 147 lb (66.7 kg)  BMI 23.73 kg/m   Specialty Comments:  No specialty comments available.  PMFS History: Patient Active Problem List   Diagnosis Date Noted  . Chronic venous hypertension w ulceration ([place])   . Melena   . AKI (acute kidney injury) ([place]) 03/03/2019  . Symptomatic anemia 02/25/2019  . Acute on chronic diastolic CHF (congestive heart failure), NYHA class 4 ([place]) 11/04/2018  . Acute blood loss anemia 11/04/2018  . Chronic bilateral pleural effusions 11/04/2018  . Shortness of breath 10/21/2018  . Displaced fracture of left femoral neck ([place]) 10/09/2018  . Somnolence   . Closed left hip fracture ([place]) 10/07/2018  . Fall   . Hypokalemia   . Afib ([place]) 06/01/2018  . Urinary frequency 09/18/2017  . Iliac artery stenosis, bilateral ([place]) 07/14/2017  .  Recurrent UTI 07/14/2017  . Long term (current) use of anticoagulants 05/08/2017  . Anemia 11/18/2016  . Dysuria 11/18/2016  . Pulmonary hypertension ([place]) 12/04/2015  . B12 deficiency 05/22/2015  . PAD (peripheral artery disease) ([place])   . Elevated serum creatinine   . Renal cell carcinoma ([place])   . S/p nephrectomy   . TIA (transient ischemic attack) 08/16/2014  . Weakness 01/14/2014  . Chronic kidney disease (CKD), stage IV (severe) ([place]) 12/08/2013  . Preoperative cardiovascular examination 06/07/2013  . Chronic diastolic CHF (congestive heart failure) ([place]) 06/04/2013  . Mitral valve regurgitation 06/04/2013  . Allergic rhinitis 01/01/2013  . Chronic cholecystitis with calculus 09/15/2012  . Claudication ([place]) 01/21/2012  . Dizziness 11/26/2011  . Hypertension 05/20/2011  . Avascular necrosis of hip ([place]) 05/03/2011  . OTHER AND UNSPECIFIED COAGULATION DEFECTS 10/13/2009  . RECTAL BLEEDING 10/13/2009  . HOCM / IHSS 07/14/2009  . EDEMA 07/14/2009  . Hyperlipidemia 04/10/2009  . ADJUSTMENT DISORDER WITH ANXIETY 04/10/2009  . INCONTINENCE, URGE 04/10/2009  . Permanent atrial fibrillation 10/27/2008  . SINUS BRADYCARDIA 10/27/2008  . Carotid artery stenosis 10/27/2008  . COPD (chronic obstructive pulmonary disease) ([place]) 10/27/2008  . ICD (implantable cardioverter-defibrillator) in place 10/27/2008   Past Medical History:  Diagnosis Date  . Adjustment disorder with anxiety   . Anginal pain ([place])   . Arthritis    "some in my hands" (11/11/2012)  . Atrial fibrillation ([place])   . Automatic implantable cardioverter-defibrillator in situ   . Blood in stool   . Carotid artery stenosis 09/2007   a. 09/2007: 60-79% bilateral (stable); b. 10/2008: 40-59% R 60-79%   . Chest pain, unspecified   . Chronic airway obstruction, not elsewhere classified   . Chronic bronchitis ([place])    "get it some; not q year" (11/11/2012)  . Chronic diastolic CHF (congestive heart failure) ([place]) 06/04/2013   . Chronic kidney disease, unspecified   . Coronary artery disease    non-obstructive by 2006 cath  . Frequent UTI    "get them a couple times/yr" (11/11/2012)  . Hemorrhage of rectum and anus   . High cholesterol   . History of blood transfusion 04/2011   "after hip OR" (11/11/2012)  . Hypertension 05/20/2011  . Hypertr obst cardiomyop   . Hypotension, unspecified    cardiac cath 2006..nonobstructive CAD 30-40s lesions.Marland KitchenETT 1/09 nondiagnostic due to poor HR response..Right Renal Cancer 2003  . Long term (current) use of anticoagulants   . Malignant neoplasm of kidney, except pelvis   . Osteoarthritis of right hip   . Other and unspecified coagulation defects   . PONV (postoperative nausea and vomiting)   . Renal cancer ([place]) 06/2001   Right  . Secondary cardiomyopathy,  unspecified   . Sinus bradycardia   . Urge incontinence     Family History  Problem Relation Age of Onset  . Heart failure Mother   . Breast cancer Maternal Aunt 82  . Breast cancer Cousin   . Breast cancer Other   . Colon cancer Neg Hx     Past Surgical History:  Procedure Laterality Date  . ABDOMINAL HYSTERECTOMY  1975   for benign causes  . APPENDECTOMY    . BI-VENTRICULAR PACEMAKER UPGRADE  05/04/2010  . BIOPSY  02/28/2019   Procedure: BIOPSY;  Surgeon: Milus Banister, MD;  Location: [facility] ENDOSCOPY;  Service: Endoscopy;;  . CARDIAC CATHETERIZATION  2006  . CARDIOVERSION N/A 02/20/2018   Procedure: CARDIOVERSION;  Surgeon: Minna Merritts, MD;  Location: [facility] ORS;  Service: Cardiovascular;  Laterality: N/A;  . CARDIOVERSION N/A 03/27/2018   Procedure: CARDIOVERSION;  Surgeon: Minna Merritts, MD;  Location: [facility] ORS;  Service: Cardiovascular;  Laterality: N/A;  . CATARACT EXTRACTION W/ INTRAOCULAR LENS  IMPLANT, BILATERAL  01/2006-02-2006  . CHOLECYSTECTOMY N/A 11/11/2012   Procedure: LAPAROSCOPIC CHOLECYSTECTOMY WITH INTRAOPERATIVE CHOLANGIOGRAM;  Surgeon: Imogene Burn. Georgette Dover, MD;  Location: [facility] [facility];   Service: General;  Laterality: N/A;  . COLONOSCOPY WITH PROPOFOL N/A 02/28/2019   Procedure: COLONOSCOPY WITH PROPOFOL;  Surgeon: Milus Banister, MD;  Location[facility] ENDOSCOPY;  Service: Endoscopy;  Laterality: N/A;  . EP IMPLANTABLE DEVICE N/A 02/21/2016   Procedure: ICD Generator Changeout;  Surgeon: Deboraha Sprang, MD;  Location: [place] [facility] LAB;  Service: Cardiovascular;  Laterality: N/A;  . ESOPHAGOGASTRODUODENOSCOPY (EGD) WITH PROPOFOL N/A 02/28/2019   Procedure: ESOPHAGOGASTRODUODENOSCOPY (EGD) WITH PROPOFOL;  Surgeon: Milus Banister, MD;  Location[facility] ENDOSCOPY;  Service: Endoscopy;  Laterality: N/A;  . GIVENS CAPSULE STUDY N/A 03/15/2019   Procedure: GIVENS CAPSULE STUDY;  Surgeon: Thornton Park, MD;  Location: [place];  Service: Gastroenterology;  Laterality: N/A;  . INSERT / REPLACE / REMOVE PACEMAKER  05-01-11   02-28-05-/05-04-10-ICD-MEDTRONIC MAXIMAL DR  . JOINT REPLACEMENT    . LAPAROSCOPIC CHOLECYSTECTOMY  11/11/2012  . LAPAROSCOPIC LYSIS OF ADHESIONS N/A 11/11/2012   Procedure: LAPAROSCOPIC LYSIS OF ADHESIONS;  Surgeon: Imogene Burn. Georgette Dover, MD;  Location: [place];  Service: General;  Laterality: N/A;  . NEPHRECTOMY Right 06/2001    S/P RENAL CELL CANCER  . PRESSURE SENSOR/CARDIOMEMS N/A 02/03/2019   Procedure: PRESSURE SENSOR/CARDIOMEMS;  Surgeon: Larey Dresser, MD;  Location: [place] [facility] LAB;  Service: Cardiovascular;  Laterality: N/A;  . RIGHT HEART CATH N/A 11/09/2018   Procedure: RIGHT HEART CATH;  Surgeon: Larey Dresser, MD;  Location: [place] [facility] LAB;  Service: Cardiovascular;  Laterality: N/A;  . RIGHT HEART CATH N/A 03/08/2019   Procedure: RIGHT HEART CATH;  Surgeon: Larey Dresser, MD;  Location: [place] [facility] LAB;  Service: Cardiovascular;  Laterality: N/A;  . SUBMUCOSAL TATTOO INJECTION  02/28/2019   Procedure: SUBMUCOSAL TATTOO INJECTION;  Surgeon: Milus Banister, MD;  Location[facility] ENDOSCOPY;  Service: Endoscopy;;  . TOTAL HIP ARTHROPLASTY Right  05/03/2011   Procedure: TOTAL HIP ARTHROPLASTY ANTERIOR APPROACH;  Surgeon: Mcarthur Rossetti;  Location: [facility] ORS;  Service: Orthopedics;  Laterality: Right;  Removal of Cannulated Screws Right Hip, Right Direct Anterior Hip Replacement  . TOTAL HIP ARTHROPLASTY Left 10/09/2018   Procedure: TOTAL HIP ARTHROPLASTY ANTERIOR APPROACH;  Surgeon: Rod Can, MD;  Location: [place];  Service: Orthopedics;  Laterality: Left;   Social History   Occupational History  . Occupation: Retired  Employer: RETIRED  Tobacco Use  . Smoking status: Former Smoker    Packs/day: 0.50    Years: 40.00    Pack years: 20.00    Types: Cigarettes    Quit date: 05/06/2001    Years since quitting: 18.3  . Smokeless tobacco: Never Used  Substance and Sexual Activity  . Alcohol use: No    Alcohol/week: 0.0 standard drinks  . Drug use: No  . Sexual activity: Never

## 2019-09-14 ENCOUNTER — Telehealth (HOSPITAL_COMMUNITY): Payer: Self-pay | Admitting: *Deleted

## 2019-09-14 NOTE — Telephone Encounter (Signed)
Pt called earlier today to express concerns about ongoing dizziness. She states it has been going on for weeks and is not getting any better. BP is good running 113-102's over 40-60s, orthostatic BP has been negative in the past. She states her heart does skip around from her a-fib but that is normal for her.   Discussed w/Dr [name], he feels this is not related to her heart as BP is good, and she is nnot orthostatic. He recommends pt f/u with her PCP regarding this issue.   Pt is aware and agreeable, she will call pcp

## 2019-09-15 ENCOUNTER — Encounter: Payer: Self-pay | Admitting: Family Medicine

## 2019-09-15 ENCOUNTER — Other Ambulatory Visit: Payer: Self-pay

## 2019-09-15 ENCOUNTER — Ambulatory Visit (INDEPENDENT_AMBULATORY_CARE_PROVIDER_SITE_OTHER): Payer: PPO | Admitting: Family Medicine

## 2019-09-15 VITALS — BP 142/60 | HR 68 | Temp 97.6°F | Ht 66.0 in | Wt 156.1 lb

## 2019-09-15 DIAGNOSIS — N898 Other specified noninflammatory disorders of vagina: Secondary | ICD-10-CM | POA: Diagnosis not present

## 2019-09-15 DIAGNOSIS — D500 Iron deficiency anemia secondary to blood loss (chronic): Secondary | ICD-10-CM | POA: Diagnosis not present

## 2019-09-15 DIAGNOSIS — R42 Dizziness and giddiness: Secondary | ICD-10-CM | POA: Diagnosis not present

## 2019-09-15 DIAGNOSIS — R310 Gross hematuria: Secondary | ICD-10-CM

## 2019-09-15 DIAGNOSIS — N184 Chronic kidney disease, stage 4 (severe): Secondary | ICD-10-CM

## 2019-09-15 DIAGNOSIS — I5032 Chronic diastolic (congestive) heart failure: Secondary | ICD-10-CM | POA: Diagnosis not present

## 2019-09-15 LAB — POC URINALSYSI DIPSTICK (AUTOMATED)
Blood, UA: POSITIVE
Glucose, UA: NEGATIVE
Ketones, UA: NEGATIVE
Leukocytes, UA: NEGATIVE
Nitrite, UA: NEGATIVE
Protein, UA: NEGATIVE
Spec Grav, UA: 1.015 (ref 1.010–1.025)
Urobilinogen, UA: 0.2 E.U./dL
pH, UA: 7 (ref 5.0–8.0)

## 2019-09-15 NOTE — Patient Instructions (Addendum)
Good to see you today  I am getting in touch with [place] about your fluid status, one of [place] will get in touch with you on Friday to see if some of the weight came off with the metolozone.   I will notify you of labs tomorrow.

## 2019-09-15 NOTE — Progress Notes (Signed)
Subjective:    Patient ID: [name], female    DOB: 1933-08-05, 86 y.o.   MRN: [id number]  HPI Chief Complaint  Patient presents with  . Dizziness    Increased SOB, incr fluid - weight is back up some.    This is an 86 yo female who presents today with above cc. Was seen several months ago with dizziness, this eventually improved. Longstanding CHF with strict fluid restriction, daily weights and cardiomems implant.  Noticed that fluid was coming back about 5-6 days ago- some increased swelling in legs. Weight has been slowly increasing. She has not missed any mims reading and thinks that someone is monitoring these. She id not take her usual metolazone dose at the beginning of the week because she had an appointment. Last dose was 09/12/19 (Sunday), she usually takes M/W/F. She reports that she is taking torsemide 5 tablets in am and 5 pm.  Seeing some pink on her perineal pads. Some vaginal itching. Has taken 2 rounds of antibiotics for cellulitis in the last couple of months.. Having some vaginal itching.  Continues to have dizziness, worsening. Has history of left ear cerumen impaction that was not able to be removed with irrigation. .  Leg wounds have improved, continues to wear compression stockings most days. Today is taking a break. Not sure if dizziness worse on days she isn't wearing compression stocking.   Review of Systems Increased SOB, palpitations    Objective:   Physical Exam Vitals reviewed.  Constitutional:      General: She is not in acute distress.    Appearance: She is normal weight. She is ill-appearing (chronically ). She is not toxic-appearing or diaphoretic.  HENT:     Head: Normocephalic and atraumatic.     Right Ear: Tympanic membrane, ear canal and external ear normal.     Left Ear: There is impacted cerumen (removed with irrigation, manual removal).  Cardiovascular:     Rate and Rhythm: Normal rate. Rhythm irregular.     Heart sounds: Murmur  present.  Pulmonary:     Effort: Pulmonary effort is normal.     Breath sounds: Normal breath sounds.  Musculoskeletal:     Right lower leg: Edema (1+) present.     Left lower leg: Edema (1+) present.  Skin:    General: Skin is warm and dry.     Comments: Bilateral lower extremities significantly improved, no open wounds, continued edema, much improved, chronic stasis skin discoloration.   Neurological:     Mental Status: She is alert and oriented to person, place, and time.  Psychiatric:        Mood and Affect: Mood normal.        Behavior: Behavior normal.        Thought Content: Thought content normal.        Judgment: Judgment normal.       BP (!) 142/60 (BP Location: Left Arm, Patient Position: Sitting, Cuff Size: Normal)   Pulse 68   Temp 97.6 F (36.4 C) (Temporal)   Ht 5\' 6"  (1.676 m)   Wt 156 lb 1.9 oz (70.8 kg)   BMI 25.20 kg/m  Wt Readings from Last 3 Encounters:  09/15/19 156 lb 1.9 oz (70.8 kg)  09/13/19 147 lb (66.7 kg)  08/17/19 147 lb 6.4 oz (66.9 kg)   Results for orders placed or performed in visit on 09/15/19  POCT Urinalysis Dipstick (Automated)  Result Value Ref Range   Color, UA yellow  Clarity, UA clear    Glucose, UA Negative Negative   Bilirubin, UA ngative    Ketones, UA negative    Spec Grav, UA 1.015 1.010 - 1.025   Blood, UA positive-2+    pH, UA 7.0 5.0 - 8.0   Protein, UA Negative Negative   Urobilinogen, UA 0.2 0.2 or 1.0 E.U./dL   Nitrite, UA negative    Leukocytes, UA Negative Negative   *Note: Due to a large number of results and/or encounters for the requested time period, some results have not been displayed. A complete set of results can be found in Results Review.       Assessment & Plan:  1. Iron deficiency anemia due to chronic blood loss - history of low iron, has upcoming follow up with hematology, has required iron infusions in past - CBC with Differential - Ferritin  2. Chronic kidney disease (CKD), stage IV  (severe) (HCC) - Basic Metabolic Panel  3. Vaginal itching - patient preferred to do self swab instead of have exam - WET PREP BY MOLECULAR PROBE  4. Gross hematuria - POCT Urinalysis Dipstick (Automated) - Urine Culture  5. Dizziness - likely multi factorial, will check labs, encouraged her to wear her compression stockings daily, change position slowly  6. Chronic diastolic CHF (congestive heart failure) (HCC) - message sent to [facility] failure [facility]. - patient to take her metolazone tomorrow am, obtain weights daily, continue fluid restriction  Over 45 minutes were spent face-to-face with the patient during this encounter and >50% of that time was spent on counseling and coordination of care  This visit occurred during the SARS-CoV-2 public health emergency.  Safety protocols were in place, including screening questions prior to the visit, additional usage of staff PPE, and extensive cleaning of exam room while observing appropriate contact time as indicated for disinfecting solutions.      Clarene Reamer, FNP-BC  [facility]  09/15/2019 5:50 PM

## 2019-09-16 LAB — CBC WITH DIFFERENTIAL/PLATELET
Basophils Absolute: 0 10*3/uL (ref 0.0–0.1)
Basophils Relative: 0.6 % (ref 0.0–3.0)
Eosinophils Absolute: 0 10*3/uL (ref 0.0–0.7)
Eosinophils Relative: 0.9 % (ref 0.0–5.0)
HCT: 29.1 % — ABNORMAL LOW (ref 36.0–46.0)
Hemoglobin: 9.3 g/dL — ABNORMAL LOW (ref 12.0–15.0)
Lymphocytes Relative: 16.8 % (ref 12.0–46.0)
Lymphs Abs: 0.7 10*3/uL (ref 0.7–4.0)
MCHC: 32 g/dL (ref 30.0–36.0)
MCV: 81.6 fl (ref 78.0–100.0)
Monocytes Absolute: 0.6 10*3/uL (ref 0.1–1.0)
Monocytes Relative: 13.2 % — ABNORMAL HIGH (ref 3.0–12.0)
Neutro Abs: 2.9 10*3/uL (ref 1.4–7.7)
Neutrophils Relative %: 68.5 % (ref 43.0–77.0)
Platelets: 248 10*3/uL (ref 150.0–400.0)
RBC: 3.56 Mil/uL — ABNORMAL LOW (ref 3.87–5.11)
RDW: 18.6 % — ABNORMAL HIGH (ref 11.5–15.5)
WBC: 4.2 10*3/uL (ref 4.0–10.5)

## 2019-09-16 LAB — URINE CULTURE
MICRO NUMBER:: 10469363
Result:: NO GROWTH
SPECIMEN QUALITY:: ADEQUATE

## 2019-09-16 LAB — BASIC METABOLIC PANEL
BUN: 43 mg/dL — ABNORMAL HIGH (ref 6–23)
CO2: 33 mEq/L — ABNORMAL HIGH (ref 19–32)
Calcium: 9.2 mg/dL (ref 8.4–10.5)
Chloride: 95 mEq/L — ABNORMAL LOW (ref 96–112)
Creatinine, Ser: 1.49 mg/dL — ABNORMAL HIGH (ref 0.40–1.20)
GFR: 33.38 mL/min — ABNORMAL LOW (ref >60.00)
Glucose, Bld: 99 mg/dL (ref 70–99)
Potassium: 4.2 mEq/L (ref 3.5–5.1)
Sodium: 137 mEq/L (ref 135–145)

## 2019-09-16 LAB — WET PREP BY MOLECULAR PROBE
Candida species: NOT DETECTED
Gardnerella vaginalis: NOT DETECTED
MICRO NUMBER:: 10469362
SPECIMEN QUALITY:: ADEQUATE
Trichomonas vaginosis: NOT DETECTED

## 2019-09-16 LAB — FERRITIN: Ferritin: 13.6 ng/mL (ref 10.0–291.0)

## 2019-09-17 ENCOUNTER — Other Ambulatory Visit (HOSPITAL_COMMUNITY): Payer: Self-pay

## 2019-09-17 DIAGNOSIS — E611 Iron deficiency: Secondary | ICD-10-CM

## 2019-09-17 NOTE — Progress Notes (Signed)
Per Dr [name], pt ordered for feraheme IV infusions for anemia. Per Medical day scheduler, nearest date available 5/24 1pm. MD aware, pt to proceed with infusion. pt aware of same. Verbalized understanding.

## 2019-09-21 ENCOUNTER — Other Ambulatory Visit: Payer: Self-pay

## 2019-09-21 ENCOUNTER — Ambulatory Visit (INDEPENDENT_AMBULATORY_CARE_PROVIDER_SITE_OTHER): Payer: PPO

## 2019-09-21 DIAGNOSIS — Z7901 Long term (current) use of anticoagulants: Secondary | ICD-10-CM

## 2019-09-21 LAB — POCT INR: INR: 2.3 (ref 2.0–3.0)

## 2019-09-21 NOTE — Patient Instructions (Addendum)
Pre visit review using our clinic review tool, if applicable. No additional management support is needed unless otherwise documented below in the visit note.  Continue taking 2mg  daily except take 3 mg on Mon, Wed, and Thurs. Recheck in 4 wks.

## 2019-09-24 ENCOUNTER — Other Ambulatory Visit: Payer: Self-pay | Admitting: Family Medicine

## 2019-09-24 DIAGNOSIS — Z7901 Long term (current) use of anticoagulants: Secondary | ICD-10-CM

## 2019-09-27 ENCOUNTER — Other Ambulatory Visit: Payer: Self-pay

## 2019-09-27 ENCOUNTER — Ambulatory Visit (HOSPITAL_COMMUNITY)
Admission: RE | Admit: 2019-09-27 | Discharge: 2019-09-27 | Disposition: A | Discharge status: Home / Self Care | Payer: PPO | Source: Ambulatory Visit | Attending: Cardiology | Admitting: Cardiology

## 2019-09-27 DIAGNOSIS — E611 Iron deficiency: Secondary | ICD-10-CM | POA: Insufficient documentation

## 2019-09-27 MED ORDER — SODIUM CHLORIDE 0.9 % IV SOLN
510.0000 mg | INTRAVENOUS | Status: DC
  Administered 2019-09-27: 510 mg via INTRAVENOUS
  Filled 2019-09-27: qty 17

## 2019-09-27 NOTE — Telephone Encounter (Signed)
Pt compliant with coumadin management. Sent in script 

## 2019-09-28 ENCOUNTER — Ambulatory Visit (HOSPITAL_COMMUNITY)
Admission: RE | Admit: 2019-09-28 | Discharge: 2019-09-28 | Disposition: A | Discharge status: Home / Self Care | Payer: PPO | Source: Ambulatory Visit | Attending: Internal Medicine | Admitting: Internal Medicine

## 2019-09-28 ENCOUNTER — Encounter (HOSPITAL_COMMUNITY): Payer: Self-pay

## 2019-09-28 ENCOUNTER — Other Ambulatory Visit: Payer: Self-pay

## 2019-09-28 VITALS — BP 138/66 | HR 60 | Wt 159.4 lb

## 2019-09-28 DIAGNOSIS — I428 Other cardiomyopathies: Secondary | ICD-10-CM | POA: Insufficient documentation

## 2019-09-28 DIAGNOSIS — I5042 Chronic combined systolic (congestive) and diastolic (congestive) heart failure: Secondary | ICD-10-CM | POA: Insufficient documentation

## 2019-09-28 DIAGNOSIS — E785 Hyperlipidemia, unspecified: Secondary | ICD-10-CM | POA: Insufficient documentation

## 2019-09-28 DIAGNOSIS — Z95810 Presence of automatic (implantable) cardiac defibrillator: Secondary | ICD-10-CM | POA: Diagnosis not present

## 2019-09-28 DIAGNOSIS — Z7901 Long term (current) use of anticoagulants: Secondary | ICD-10-CM | POA: Insufficient documentation

## 2019-09-28 DIAGNOSIS — Z85528 Personal history of other malignant neoplasm of kidney: Secondary | ICD-10-CM | POA: Insufficient documentation

## 2019-09-28 DIAGNOSIS — N183 Chronic kidney disease, stage 3 unspecified: Secondary | ICD-10-CM | POA: Diagnosis not present

## 2019-09-28 DIAGNOSIS — I4821 Permanent atrial fibrillation: Secondary | ICD-10-CM | POA: Diagnosis not present

## 2019-09-28 DIAGNOSIS — I130 Hypertensive heart and chronic kidney disease with heart failure and stage 1 through stage 4 chronic kidney disease, or unspecified chronic kidney disease: Secondary | ICD-10-CM | POA: Diagnosis not present

## 2019-09-28 DIAGNOSIS — D638 Anemia in other chronic diseases classified elsewhere: Secondary | ICD-10-CM | POA: Insufficient documentation

## 2019-09-28 DIAGNOSIS — I2510 Atherosclerotic heart disease of native coronary artery without angina pectoris: Secondary | ICD-10-CM | POA: Insufficient documentation

## 2019-09-28 DIAGNOSIS — Z87891 Personal history of nicotine dependence: Secondary | ICD-10-CM | POA: Diagnosis not present

## 2019-09-28 DIAGNOSIS — J9611 Chronic respiratory failure with hypoxia: Secondary | ICD-10-CM | POA: Insufficient documentation

## 2019-09-28 DIAGNOSIS — D509 Iron deficiency anemia, unspecified: Secondary | ICD-10-CM | POA: Insufficient documentation

## 2019-09-28 DIAGNOSIS — I5032 Chronic diastolic (congestive) heart failure: Secondary | ICD-10-CM

## 2019-09-28 DIAGNOSIS — Z79899 Other long term (current) drug therapy: Secondary | ICD-10-CM | POA: Insufficient documentation

## 2019-09-28 DIAGNOSIS — I272 Pulmonary hypertension, unspecified: Secondary | ICD-10-CM | POA: Insufficient documentation

## 2019-09-28 DIAGNOSIS — J449 Chronic obstructive pulmonary disease, unspecified: Secondary | ICD-10-CM | POA: Insufficient documentation

## 2019-09-28 DIAGNOSIS — Z79890 Hormone replacement therapy (postmenopausal): Secondary | ICD-10-CM | POA: Insufficient documentation

## 2019-09-28 LAB — CBC
HCT: 28.7 % — ABNORMAL LOW (ref 36.0–46.0)
Hemoglobin: 8.2 g/dL — ABNORMAL LOW (ref 12.0–15.0)
MCH: 23.8 pg — ABNORMAL LOW (ref 26.0–34.0)
MCHC: 28.6 g/dL — ABNORMAL LOW (ref 30.0–36.0)
MCV: 83.2 fL (ref 80.0–100.0)
Platelets: 223 10*3/uL (ref 150–400)
RBC: 3.45 MIL/uL — ABNORMAL LOW (ref 3.87–5.11)
RDW: 17.2 % — ABNORMAL HIGH (ref 11.5–15.5)
WBC: 3.5 10*3/uL — ABNORMAL LOW (ref 4.0–10.5)
nRBC: 0 % (ref 0.0–0.2)

## 2019-09-28 LAB — BASIC METABOLIC PANEL
Anion gap: 14 (ref 5–15)
BUN: 47 mg/dL — ABNORMAL HIGH (ref 8–23)
CO2: 28 mmol/L (ref 22–32)
Calcium: 9 mg/dL (ref 8.9–10.3)
Chloride: 94 mmol/L — ABNORMAL LOW (ref 98–111)
Creatinine, Ser: 1.57 mg/dL — ABNORMAL HIGH (ref 0.44–1.00)
GFR calc Af Amer: 35 mL/min — ABNORMAL LOW (ref >60)
GFR calc non Af Amer: 30 mL/min — ABNORMAL LOW (ref >60)
Glucose, Bld: 92 mg/dL (ref 70–99)
Potassium: 4.4 mmol/L (ref 3.5–5.1)
Sodium: 136 mmol/L (ref 135–145)

## 2019-09-28 MED ORDER — METOLAZONE 5 MG PO TABS: 5.0000 mg | ORAL_TABLET | ORAL | 4 refills | Status: DC

## 2019-09-28 NOTE — Progress Notes (Signed)
PCP: Elby Beck, FNP Cardiology: Dr. Rockey Situ HF Cardiology: Dr. [name] Ortho: Dr Sharol Given   Reason for Visit: f/u for chronic diastolic HF  [name] is a 86 year old with a history of permanent atrial fibrillation, chronic diastolic CHF, CKD stage 3, smoking/prior COPD, renal cell CA s/p right nephrectomy.  She has a prior history of HFrEF with nonischemic cardiomyoapthy and had a Medtronic ICD placed.  However, subsequently her EF has recovered.  More recently, she has had symptomatic HFpEF.    She has had dyspnea and edema for "a long time," but it has been worse since January of this year. In 6/20, she had left hip ORIF after mechanical fall.  Echo in 6/20 showed EF 60-65%, mild RV dilation with normal systolic function, PASP 93 mmHg.  After she got home from the hospital stay post-ORIF, she noted marked peripheral edema.  She was short of breath walking short distances.  +orthopnea.  She came back to the hospital on 11/04/18 and was admitted with volume overload.  She was diuresed aggressively and lost about 22 lbs.  RHC showed pulmonary venous hypertension.  PCWP tracing had prominent v-waves in absence of significant MR, suggesting significant diastolic dysfunction.   She has been noted to have significant Fe deficiency anemia, but FOBT was negative during 7/20 hospitalization.   She was admitted in 11/20 with symptomatic anemia, GI workup concerning for ischemic bowel.  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 Doren Custard with VVS and conservative management recommended.  Capsule endoscopy showed no definite source of bleeding.  She was volume overloaded due to significant RV dysfunction and diuresed, she also developed AKI on CKD stage 3.    Current diuretic regimen = torsemide 100 mg bid + 2.5 mg of metolazone 3 days a week. She has cardiomems implant w/ good daily compliance w/ reading submissions. She is now followed closely by hematology and getting iron  infusions.   She presents to clinic today for f/u. Here w/ her daughter. Feels tired. Just got Feraheme infusion yesterday. Cardiomems readings elevated. PA diastolic goal is 19. PA diastolic pressure 20 today. This is in the setting of poor compliance w/ metolazone and dietary indiscretion w/ sodium. She admits that she often skips metolazone on days when she has errands to run, which is frequent. Almost never gets 3 doses in each week. Also ate Janine Limbo the other night. Wt is up ~10 lb. Denies resting dyspnea. Chronically NYHA Class III. Denies abnormal bleeding.     Labs (7/20): K 3.7, creatinine 1.44 => 1.33 Labs (12/20): pro-BNP 431, K 4.4, creatinine 1.24, hgb 8.2 Labs (06/02/18): Iron Sat 5%, K 4.4, creatinine 1.65 Labs (08/11/19): SCr 1.44, BUN 49, K 3.3, Hgb 10.8   PMH: 1. COPD 2. Renal cell carcinoma: S/p right kidney resection.  3. CKD stage 3 4. Fe deficiency anemia 5. Atrial fibrillation: Permanent. She has failed amiodarone, Tikosyn, and flecainide as well as multiple cardioversions. 6. Chronic diastolic CHF: She has history of prior HFrEF with nonischemic cardiomyopathy.  She has a Medtronic ICD.   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  - More recently, EF has been in normal range but she has had diastolic CHF.  - Echo (2/87): EF 60-65%, mildly dilated RV with PASP 93 mmHg, mild-moderate TR.  - RHC (7/20): mean RA 11, PA 64/18 mean 39, mean PCWP 24 with v waves to 41, CI 4.21, PVR 1.9 WU.  - Cardiomems placement - RHC (11/20): mean  RA 13, PA 63/23 mean 25 with v waves to 47 (MR only mild by echo), mean PCWP 25, CI 3.18, PVR 2.6 WU 7. Hyperlipidemia 8. Carotid stenosis: Carotid dopplers (8/93) with 81-01% LICA stenosis.   9. Possible ischemic bowel in 11/20 10. 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 Conservative management per Dr. Doren Custard.   Social History   Socioeconomic History  . Marital status: Widowed    Spouse name: Not on  file  . Number of children: Not on file  . Years of education: Not on file  . Highest education level: Not on file  Occupational History  . Occupation: Retired    Fish farm manager: RETIRED  Tobacco Use  . Smoking status: Former Smoker    Packs/day: 0.50    Years: 40.00    Pack years: 20.00    Types: Cigarettes    Quit date: 05/06/2001    Years since quitting: 18.4  . Smokeless tobacco: Never Used  Substance and Sexual Activity  . Alcohol use: No    Alcohol/week: 0.0 standard drinks  . Drug use: No  . Sexual activity: Never  Other Topics Concern  . 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   ALCOHOL USE-NO   RETIRED   FORMER TOBACCO USE.....1/2 PACK X 40 YRS UNTIL 2003   Would desire CPR   Social Determinants of Health   Financial Resource Strain:   . Difficulty of Paying Living Expenses:   Food Insecurity:   . Worried About Charity fundraiser in the Last Year:   . Arboriculturist in the Last Year:   Transportation Needs:   . Film/video editor (Medical):   Marland Kitchen Lack of Transportation (Non-Medical):   Physical Activity:   . Days of Exercise per Week:   . Minutes of Exercise per Session:   Stress:   . Feeling of Stress :   Social Connections:   . Frequency of Communication with Friends and Family:   . Frequency of Social Gatherings with Friends and Family:   . Attends Religious Services:   . Active Member of Clubs or Organizations:   . Attends Archivist Meetings:   Marland Kitchen Marital Status:   Intimate Partner Violence:   . Fear of Current or Ex-Partner:   . Emotionally Abused:   Marland Kitchen Physically Abused:   . Sexually Abused:    Family History  Problem Relation Age of Onset  . Heart failure Mother   . Breast cancer Maternal Aunt 82  . Breast cancer Cousin   . Breast cancer Other   . Colon cancer Neg Hx      ROS: All systems reviewed and negative except as per HPI.   Current Outpatient Medications  Medication Sig Dispense Refill  . acetaminophen (TYLENOL) 500 MG tablet Take 500-1,000 mg by mouth every 8 (eight) hours as needed for mild pain or headache.     . bisacodyl (DULCOLAX) 5 MG EC tablet Take 1 tablet (5 mg total) by mouth daily as needed for moderate constipation. 10 tablet 0  . cetirizine (ZYRTEC) 10 MG tablet Take 10 mg by mouth daily.     . Cholecalciferol (VITAMIN D) 50 MCG (2000 UT) CAPS Take 4,000 Units by mouth daily.    Marland Kitchen conjugated estrogens (PREMARIN) vaginal cream  Place 1 Applicatorful vaginally daily. 42.5 g 12  . Cranberry (AZO CRANBERRY GUMMIES) 500 MG CHEW Chew 2,000 mg by mouth daily.    Marland Kitchen ezetimibe (ZETIA) 10 MG tablet Take 1 tablet (10 mg total) by mouth daily. 30 tablet 11  . ferrous sulfate 325 (65 FE) MG tablet Take 1 tablet (325 mg total) by mouth 2 (two) times daily with a meal. 60 tablet 0  . fluticasone (FLONASE) 50 MCG/ACT nasal spray Place 2 sprays into both nostrils daily as needed for allergies or rhinitis. 16 g 6  . levothyroxine (SYNTHROID) 25 MCG tablet TAKE 1 TABLET BY MOUTH ONCE DAILY BEFOREBREAKFAST 30 tablet 3  . magnesium oxide (MAG-OX) 400 MG tablet Take 1 tablet (400 mg total) by mouth daily. 30 tablet 2  . metolazone (ZAROXOLYN) 5 MG tablet Take 5 mg by mouth 3 (three) times a week.    . potassium chloride SA (KLOR-CON) 20 MEQ tablet Take 40 mEq by mouth 2 (two) times daily. 1 extra tab on metolazone days    . rosuvastatin (CRESTOR) 40 MG tablet Take 1 tablet (40 mg total) by mouth daily. 90 tablet 3  . torsemide (DEMADEX) 100 MG tablet Take 1 tablet (100 mg total) by mouth 2 (two) times daily. 180 tablet 1  . vitamin C (ASCORBIC ACID) 250 MG tablet Take 500 mg by mouth daily.    Marland Kitchen warfarin (COUMADIN) 2 MG tablet TAKE 1 TABLETS BY MOUTH DAILY AT 6PM AS DIRECTED BY ANTICOAGULATION CLINIC. 90 tablet 1   No current facility-administered medications  for this encounter.   BP 138/66   Pulse 60   Wt 72.3 kg (159 lb 6.4 oz)   SpO2 96%   BMI 25.73 kg/m   Wt Readings from Last 3 Encounters:  09/28/19 72.3 kg (159 lb 6.4 oz)  09/15/19 70.8 kg (156 lb 1.9 oz)  09/13/19 66.7 kg (147 lb)   PHYSICAL EXAM: General:  Elderly WM. No respiratory difficulty HEENT: normal Neck: supple. JVD ~ 9 cm. Carotids 2+ bilat; no bruits. No lymphadenopathy or thyromegaly appreciated. Cor: PMI nondisplaced. Regular rate & rhythm. No rubs, gallops or murmurs. Lungs: clear Abdomen: soft, nontender, nondistended. No hepatosplenomegaly. No bruits or masses. Good bowel sounds. Extremities: no cyanosis, clubbing, rash, trace- 1+ bilateral LE edema Neuro: alert & oriented x 3, cranial nerves grossly intact. moves all 4 extremities w/o difficulty. Affect pleasant.   Assessment/Plan: 1. Chronic diastolic CHF: Echo in 6/96 showed EF 60-65%, mild RV dilation with normal systolic function, PASP 93 mmHg. She had remote nonischemic cardiomyopathy with recovery of EF, has Medtronic ICD. Suspect that there is significant RV dysfunction. Atrial fibrillation likely contributes, but this appears to be permanent now as she has failed multiple anti-arrhythmics and is reasonably rate-controlled. [place] 11/20 showed elevated filling pressures and pulmonary venous hypertension.  Prominent v-waves on PCWP tracing likely due to stiff ventricle/diastolic dysfunction as she had only mild MR on recent echo.  -volume status elevated on exam and by Cardiomems reading, in the setting of poor compliance w/ metolazone and recent dietary indiscretion w/ sodium. - she will take a dose of metolazone tonight and another dose in the AM, then resume 3 days a week. She will change her regimen to Tue/Thur/Sat to better fit her schedule in an effort to improve compliance. We discussed the importance of maintaining a strict regimen to prevent worsening fluid overload and hospitalization. Also encouraged low  sodium diet and avoiding eating out.  - Continue torsemide 100 mg bid -  Check BMP today  - Continue w/ compression stockings  - Continue daily cardiomems monitoring  2. Pulmonary hypertension: PA systolic pressure estimated 93 mmHg on echo. Based on [place] 11/20, she has primarily pulmonary venous hypertension.  - continue diuretics per above  3. CKD: Stage 3.  - check BMP today 4. Anemia: Fe deficiency. She denies overt bleeding.  Capsule endoscopy during 11/20 admission did not show bleeding source.  - she is getting IV iron. Next infusion 6/1  - check CBC today  5. Atrial fibrillation: Permanent now. Rate controlled. She has failed amiodarone, Tikosyn, and flecainide as well as multiple cardioversions.  - Continue warfarin for anticoagulation  6. Chronic Hypoxic Respiratory Failure -  home O2 ordered.   F/u in 4-6 weeks    [name] Boven Rosita Fire PA-C  09/28/2019

## 2019-09-28 NOTE — Patient Instructions (Signed)
Take Metolazone 2.5mg  (1 tab) TODAY and TOMORROW  THEN, Saturday, Tuesday AND Thursday going forward   Labs today We will only contact you if something comes back abnormal or we need to make some changes. Otherwise no news is good news!  Your physician recommends that you schedule a follow-up appointment in: 5-6 weeks with Nurse Practitioner/Physician Assistant.  Please call office at [phone] option [phone] if you have any questions or concerns.   At the [facility], you and your health needs are our priority. As part of our continuing mission to provide you with exceptional heart care, we have created designated Provider Care Teams. These Care Teams include your primary Cardiologist (physician) and Advanced Practice Providers (APPs- Physician Assistants and Nurse Practitioners) who all work together to provide you with the care you need, when you need it.   You may see any of the following providers on your designated Care Team at your next follow up: Marland Kitchen Dr Glori Bickers . Dr Loralie Champagne . Darrick Grinder, NP . Lyda Jester, PA . Audry Riles, PharmD   Please be sure to bring in all your medications bottles to every appointment.

## 2019-10-05 ENCOUNTER — Ambulatory Visit (HOSPITAL_COMMUNITY)
Admission: RE | Admit: 2019-10-05 | Discharge: 2019-10-05 | Disposition: A | Discharge status: Home / Self Care | Payer: PPO | Source: Ambulatory Visit | Attending: Cardiology | Admitting: Cardiology

## 2019-10-05 ENCOUNTER — Other Ambulatory Visit: Payer: Self-pay

## 2019-10-05 DIAGNOSIS — E611 Iron deficiency: Secondary | ICD-10-CM | POA: Insufficient documentation

## 2019-10-05 MED ORDER — SODIUM CHLORIDE 0.9 % IV SOLN
510.0000 mg | INTRAVENOUS | Status: DC
  Administered 2019-10-05: 510 mg via INTRAVENOUS
  Filled 2019-10-05: qty 17

## 2019-10-11 ENCOUNTER — Other Ambulatory Visit: Payer: Self-pay

## 2019-10-11 ENCOUNTER — Ambulatory Visit (HOSPITAL_COMMUNITY)
Admission: RE | Admit: 2019-10-11 | Discharge: 2019-10-11 | Disposition: A | Discharge status: Home / Self Care | Payer: PPO | Source: Ambulatory Visit | Attending: Adult Health | Admitting: Adult Health

## 2019-10-11 ENCOUNTER — Ambulatory Visit: Payer: PPO | Admitting: Physician Assistant

## 2019-10-11 DIAGNOSIS — I5022 Chronic systolic (congestive) heart failure: Secondary | ICD-10-CM | POA: Diagnosis not present

## 2019-10-11 DIAGNOSIS — I5032 Chronic diastolic (congestive) heart failure: Secondary | ICD-10-CM

## 2019-10-11 NOTE — Progress Notes (Signed)
PAD Goal 19   PAD Reading 19  I have reviewed current PAD from cardiomems device. PAD reading stable. .  Continue current diuretic regimen.   Follow monthly   Keah Lamba  NP-C  5:50 PM

## 2019-10-19 ENCOUNTER — Ambulatory Visit: Payer: PPO

## 2019-10-21 ENCOUNTER — Ambulatory Visit (INDEPENDENT_AMBULATORY_CARE_PROVIDER_SITE_OTHER): Payer: PPO

## 2019-10-21 ENCOUNTER — Other Ambulatory Visit: Payer: Self-pay

## 2019-10-21 DIAGNOSIS — Z7901 Long term (current) use of anticoagulants: Secondary | ICD-10-CM

## 2019-10-21 LAB — POCT INR: INR: 1.5 — AB (ref 2.0–3.0)

## 2019-10-21 NOTE — Patient Instructions (Addendum)
Pre visit review using our clinic review tool, if applicable. No additional management support is needed unless otherwise documented below in the visit note.  Increase dose today to 3mg  and increase dose tomorrow to 4mg  then continue taking 2mg  daily except take 3 mg on Mon, Wed, and Fri. Recheck in 2 wks.

## 2019-10-28 ENCOUNTER — Telehealth: Payer: Self-pay

## 2019-10-28 ENCOUNTER — Ambulatory Visit: Payer: PPO

## 2019-10-28 NOTE — Telephone Encounter (Signed)
Called patient to complete her Medicare visit. Patient wants to cancel because she had company show up at her house. Appointment cancelled, will call back to reschedule for later date.

## 2019-10-29 ENCOUNTER — Other Ambulatory Visit (HOSPITAL_COMMUNITY): Payer: Self-pay

## 2019-10-29 DIAGNOSIS — E611 Iron deficiency: Secondary | ICD-10-CM

## 2019-11-01 NOTE — Progress Notes (Signed)
PCP: Elby Beck, FNP Cardiology: Dr. Rockey Situ HF Cardiology: Dr. [name] Ortho: Dr Sharol Given   Reason for Visit: f/u for chronic diastolic HF  [name] is a 86 year old with a history of permanent atrial fibrillation, chronic diastolic CHF, CKD stage 3, smoking/prior COPD, renal cell CA s/p right nephrectomy.  She has a prior history of HFrEF with nonischemic cardiomyoapthy and had a Medtronic ICD placed.  However, subsequently her EF has recovered.  More recently, she has had symptomatic HFpEF.    She has had dyspnea and edema for "a long time," but it has been worse since January of this year. In 6/20, she had left hip ORIF after mechanical fall.  Echo in 6/20 showed EF 60-65%, mild RV dilation with normal systolic function, PASP 93 mmHg.  After she got home from the hospital stay post-ORIF, she noted marked peripheral edema.  She was short of breath walking short distances.  +orthopnea.  She came back to the hospital on 11/04/18 and was admitted with volume overload.  She was diuresed aggressively and lost about 22 lbs.  RHC showed pulmonary venous hypertension.  PCWP tracing had prominent v-waves in absence of significant MR, suggesting significant diastolic dysfunction.   She has been noted to have significant Fe deficiency anemia, but FOBT was negative during 7/20 hospitalization.   She was admitted in 11/20 with symptomatic anemia, GI workup concerning for ischemic bowel.  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 Doren Custard with VVS and conservative management recommended.  Capsule endoscopy showed no definite source of bleeding.  She was volume overloaded due to significant RV dysfunction and diuresed, she also developed AKI on CKD stage 3.    Current diuretic regimen = torsemide 100 mg bid + 2.5 mg of metolazone 3 days a week. She has cardiomems implant w/ good daily compliance w/ reading submissions. She is now followed closely by hematology and getting iron  infusions.   Today she returns for HF follow up.Overall feeling dizzy and weakness. Says she felt a little better after iron infusion on 10/05/2019.  Denies SOB/PND/Orthopnea. Denies BRBPR. Appetite ok. No fever or chills. Weight at home 145-147 pounds. Taking all medications. Her grandson lives with her.   Labs (7/20): K 3.7, creatinine 1.44 => 1.33 Labs (12/20): pro-BNP 431, K 4.4, creatinine 1.24, hgb 8.2 Labs (06/02/18): Iron Sat 5%, K 4.4, creatinine 1.65 Labs (08/11/19): SCr 1.44, BUN 49, K 3.3, Hgb 10.8 Labs (09/28/19): K 4.4 Creatinine 1.57    PMH: 1. COPD 2. Renal cell carcinoma: S/p right kidney resection.  3. CKD stage 3 4. Fe deficiency anemia 5. Atrial fibrillation: Permanent. She has failed amiodarone, Tikosyn, and flecainide as well as multiple cardioversions. 6. Chronic diastolic CHF: She has history of prior HFrEF with nonischemic cardiomyopathy.  She has a Medtronic ICD.   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  - More recently, EF has been in normal range but she has had diastolic CHF.  - Echo (6/01): EF 60-65%, mildly dilated RV with PASP 93 mmHg, mild-moderate TR.  - RHC (7/20): mean RA 11, PA 64/18 mean 39, mean PCWP 24 with v waves to 41, CI 4.21, PVR 1.9 WU.  - Cardiomems placement - RHC (11/20): mean RA 13, PA 63/23 mean 25 with v waves to 47 (MR only mild by echo), mean PCWP 25, CI 3.18, PVR 2.6 WU 7. Hyperlipidemia 8. Carotid stenosis: Carotid dopplers (0/93) with 23-55% LICA stenosis.   9. Possible ischemic bowel in  11/20 10. 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 Conservative management per Dr. Doren Custard.   Social History   Socioeconomic History  . Marital status: Widowed    Spouse name: Not on file  . Number of children: Not on file  . Years of education: Not on file  . Highest education level: Not on file  Occupational History  . Occupation: Retired    Fish farm manager: RETIRED  Tobacco Use  . Smoking status: Former Smoker      Packs/day: 0.50    Years: 40.00    Pack years: 20.00    Types: Cigarettes    Quit date: 05/06/2001    Years since quitting: 18.5  . Smokeless tobacco: Never Used  Vaping Use  . Vaping Use: Never used  Substance and Sexual Activity  . Alcohol use: No    Alcohol/week: 0.0 standard drinks  . Drug use: No  . Sexual activity: Never  Other Topics Concern  . 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   ALCOHOL USE-NO   RETIRED   FORMER TOBACCO USE.....1/2 PACK X 40 YRS UNTIL 2003   Would desire CPR   Social Determinants of Health   Financial Resource Strain:   . Difficulty of Paying Living Expenses:   Food Insecurity:   . Worried About Charity fundraiser in the Last Year:   . Arboriculturist in the Last Year:   Transportation Needs:   . Film/video editor (Medical):   Marland Kitchen Lack of Transportation (Non-Medical):   Physical Activity:   . Days of Exercise per Week:   . Minutes of Exercise per Session:   Stress:   . Feeling of Stress :   Social Connections:   . Frequency of Communication with Friends and Family:   . Frequency of Social Gatherings with Friends and Family:   . Attends Religious Services:   . Active Member of Clubs or Organizations:   . Attends Archivist Meetings:   Marland Kitchen Marital Status:   Intimate Partner Violence:   . Fear of Current or Ex-Partner:   . Emotionally Abused:   Marland Kitchen Physically Abused:   . Sexually Abused:    Family History  Problem Relation Age of Onset  . Heart failure Mother   . Breast cancer Maternal Aunt 82  . Breast cancer Cousin   . Breast cancer Other   . Colon cancer Neg Hx    ROS: All systems reviewed and negative except as per HPI.   Current Outpatient Medications  Medication Sig Dispense Refill  . acetaminophen (TYLENOL) 500 MG tablet Take 500-1,000 mg by  mouth every 8 (eight) hours as needed for mild pain or headache.     . bisacodyl (DULCOLAX) 5 MG EC tablet Take 1 tablet (5 mg total) by mouth daily as needed for moderate constipation. 10 tablet 0  . cetirizine (ZYRTEC) 10 MG tablet Take 10 mg by mouth daily.     . Cholecalciferol (VITAMIN D) 50 MCG (2000 UT) CAPS Take 4,000 Units by mouth daily.    Marland Kitchen conjugated estrogens (PREMARIN) vaginal cream Place 1 Applicatorful vaginally daily. 42.5 g 12  . Cranberry (AZO CRANBERRY GUMMIES) 500 MG CHEW Chew 2,000 mg by mouth daily.    Marland Kitchen ezetimibe (ZETIA) 10 MG tablet Take 1  tablet (10 mg total) by mouth daily. 30 tablet 11  . ferrous sulfate 325 (65 FE) MG tablet Take 1 tablet (325 mg total) by mouth 2 (two) times daily with a meal. 60 tablet 0  . fluticasone (FLONASE) 50 MCG/ACT nasal spray Place 2 sprays into both nostrils daily as needed for allergies or rhinitis. 16 g 6  . levothyroxine (SYNTHROID) 25 MCG tablet TAKE 1 TABLET BY MOUTH ONCE DAILY BEFOREBREAKFAST 30 tablet 3  . magnesium oxide (MAG-OX) 400 MG tablet Take 1 tablet (400 mg total) by mouth daily. 30 tablet 2  . metolazone (ZAROXOLYN) 5 MG tablet Take 1 tablet (5 mg total) by mouth 3 (three) times a week. Saturday, Tuesday, and Thursday 12 tablet 4  . potassium chloride SA (KLOR-CON) 20 MEQ tablet Take 40 mEq by mouth 2 (two) times daily. 1 extra tab on metolazone days    . rosuvastatin (CRESTOR) 40 MG tablet Take 1 tablet (40 mg total) by mouth daily. 90 tablet 3  . torsemide (DEMADEX) 100 MG tablet Take 1 tablet (100 mg total) by mouth 2 (two) times daily. 180 tablet 1  . vitamin C (ASCORBIC ACID) 250 MG tablet Take 500 mg by mouth daily.    Marland Kitchen warfarin (COUMADIN) 2 MG tablet TAKE 1 TABLETS BY MOUTH DAILY AT 6PM AS DIRECTED BY ANTICOAGULATION CLINIC. 90 tablet 1   No current facility-administered medications for this encounter.   BP (!) 118/58   Pulse 60   Wt 66 kg (145 lb 9.6 oz)   SpO2 98%   BMI 23.50 kg/m   Wt Readings from Last 3  Encounters:  11/02/19 66 kg (145 lb 9.6 oz)  09/28/19 72.3 kg (159 lb 6.4 oz)  09/15/19 70.8 kg (156 lb 1.9 oz)   PHYSICAL EXAM: General:  No resp difficulty HEENT: normal Neck: supple. JVP 6-7 . Carotids 2+ bilat; no bruits. No lymphadenopathy or thryomegaly appreciated. Cor: PMI nondisplaced. Irregular rate & rhythm. No rubs, gallops or murmurs. Lungs: clear Abdomen: soft, nontender, nondistended. No hepatosplenomegaly. No bruits or masses. Good bowel sounds. Extremities: no cyanosis, clubbing, rash, R and LLE trace edema Neuro: alert & orientedx3, cranial nerves grossly intact. moves all 4 extremities w/o difficulty. Affect pleasant   Assessment/Plan: 1. Chronic diastolic CHF: Echo in 2/02 showed EF 60-65%, mild RV dilation with normal systolic function, PASP 93 mmHg. She had remote nonischemic cardiomyopathy with recovery of EF, has Medtronic ICD. Suspect that there is significant RV dysfunction. Atrial fibrillation likely contributes, but this appears to be permanent now as she has failed multiple anti-arrhythmics and is reasonably rate-controlled. [place] 11/20 showed elevated filling pressures and pulmonary venous hypertension.  Prominent v-waves on PCWP tracing likely due to stiff ventricle/diastolic dysfunction as she had only mild MR on recent echo.  - Cardiomems Goal 19 mm hg, todays reading is 13 -NYHA II. Volume status appears low. Hold torsemide tonight then resume 100 mg twice a day - Cut back metolazone to twice a week.   -- Continue w/ compression stockings  - Continue daily cardiomems monitoring  2. Pulmonary hypertension: PA systolic pressure estimated 93 mmHg on echo. Based on [place] 11/20, she has primarily pulmonary venous hypertension.  - continue diuretics per above  3. CKD: Stage 3.  Check BMET.  4. Anemia: Fe deficiency. She denies overt bleeding.  Capsule endoscopy during 11/20 admission did not show bleeding source.  - Received iron infusion 10/05/2019. - Check  CBC.   5. Atrial fibrillation: Permanent now. Rate controlled. She has  failed amiodarone, Tikosyn, and flecainide as well as multiple cardioversions.  - Continue warfarin for anticoagulation  6. Chronic Hypoxic Respiratory Failure -  She never picked up oxygen.   Check CBC and BMET today. Discussed cardiomems reading.  Follow up in 3 months.   Sender Rueb  NP-C  Advanced Heart Failure Team  2:05 PM

## 2019-11-02 ENCOUNTER — Ambulatory Visit (HOSPITAL_COMMUNITY)
Admission: RE | Admit: 2019-11-02 | Discharge: 2019-11-02 | Disposition: A | Discharge status: Home / Self Care | Payer: PPO | Source: Ambulatory Visit | Attending: Adult Health | Admitting: Adult Health

## 2019-11-02 ENCOUNTER — Encounter (HOSPITAL_COMMUNITY): Payer: Self-pay

## 2019-11-02 ENCOUNTER — Other Ambulatory Visit: Payer: Self-pay

## 2019-11-02 VITALS — BP 118/58 | HR 60 | Wt 145.6 lb

## 2019-11-02 DIAGNOSIS — I2510 Atherosclerotic heart disease of native coronary artery without angina pectoris: Secondary | ICD-10-CM | POA: Diagnosis not present

## 2019-11-02 DIAGNOSIS — Z79890 Hormone replacement therapy (postmenopausal): Secondary | ICD-10-CM | POA: Diagnosis not present

## 2019-11-02 DIAGNOSIS — E785 Hyperlipidemia, unspecified: Secondary | ICD-10-CM | POA: Insufficient documentation

## 2019-11-02 DIAGNOSIS — I428 Other cardiomyopathies: Secondary | ICD-10-CM | POA: Diagnosis not present

## 2019-11-02 DIAGNOSIS — Z803 Family history of malignant neoplasm of breast: Secondary | ICD-10-CM | POA: Diagnosis not present

## 2019-11-02 DIAGNOSIS — N183 Chronic kidney disease, stage 3 unspecified: Secondary | ICD-10-CM | POA: Insufficient documentation

## 2019-11-02 DIAGNOSIS — Z95810 Presence of automatic (implantable) cardiac defibrillator: Secondary | ICD-10-CM | POA: Diagnosis not present

## 2019-11-02 DIAGNOSIS — Z79899 Other long term (current) drug therapy: Secondary | ICD-10-CM | POA: Insufficient documentation

## 2019-11-02 DIAGNOSIS — E611 Iron deficiency: Secondary | ICD-10-CM

## 2019-11-02 DIAGNOSIS — Z87891 Personal history of nicotine dependence: Secondary | ICD-10-CM | POA: Insufficient documentation

## 2019-11-02 DIAGNOSIS — Z8249 Family history of ischemic heart disease and other diseases of the circulatory system: Secondary | ICD-10-CM | POA: Insufficient documentation

## 2019-11-02 DIAGNOSIS — J9611 Chronic respiratory failure with hypoxia: Secondary | ICD-10-CM | POA: Diagnosis not present

## 2019-11-02 DIAGNOSIS — D509 Iron deficiency anemia, unspecified: Secondary | ICD-10-CM | POA: Insufficient documentation

## 2019-11-02 DIAGNOSIS — I4821 Permanent atrial fibrillation: Secondary | ICD-10-CM

## 2019-11-02 DIAGNOSIS — J449 Chronic obstructive pulmonary disease, unspecified: Secondary | ICD-10-CM | POA: Diagnosis not present

## 2019-11-02 DIAGNOSIS — Z905 Acquired absence of kidney: Secondary | ICD-10-CM | POA: Insufficient documentation

## 2019-11-02 DIAGNOSIS — Z7901 Long term (current) use of anticoagulants: Secondary | ICD-10-CM | POA: Diagnosis not present

## 2019-11-02 DIAGNOSIS — I272 Pulmonary hypertension, unspecified: Secondary | ICD-10-CM | POA: Insufficient documentation

## 2019-11-02 DIAGNOSIS — Z85528 Personal history of other malignant neoplasm of kidney: Secondary | ICD-10-CM | POA: Insufficient documentation

## 2019-11-02 DIAGNOSIS — I5032 Chronic diastolic (congestive) heart failure: Secondary | ICD-10-CM

## 2019-11-02 LAB — BASIC METABOLIC PANEL
Anion gap: 13 (ref 5–15)
BUN: 50 mg/dL — ABNORMAL HIGH (ref 8–23)
CO2: 37 mmol/L — ABNORMAL HIGH (ref 22–32)
Calcium: 9.5 mg/dL (ref 8.9–10.3)
Chloride: 87 mmol/L — ABNORMAL LOW (ref 98–111)
Creatinine, Ser: 1.46 mg/dL — ABNORMAL HIGH (ref 0.44–1.00)
GFR calc Af Amer: 38 mL/min — ABNORMAL LOW (ref >60)
GFR calc non Af Amer: 33 mL/min — ABNORMAL LOW (ref >60)
Glucose, Bld: 101 mg/dL — ABNORMAL HIGH (ref 70–99)
Potassium: 3.4 mmol/L — ABNORMAL LOW (ref 3.5–5.1)
Sodium: 137 mmol/L (ref 135–145)

## 2019-11-02 MED ORDER — METOLAZONE 5 MG PO TABS: 5.0000 mg | ORAL_TABLET | ORAL | 4 refills | Status: DC

## 2019-11-02 NOTE — Addendum Note (Signed)
Encounter addended by: Kerry Dory, CMA on: 11/02/2019 4:49 PM  Actions taken: Order list changed, Diagnosis association updated

## 2019-11-02 NOTE — Patient Instructions (Signed)
Labs done today. We will contact you only if your labs are abnormal.  DECREASE Metolazone 5mg (1 tablet) by mouth 2 times weekly.(Tuesday and Saturday)  HOLD TORSEMIDE TONIGHT,RESUME TOMORROW  No other medication changes were made. Please continue all other medications as prescribed.  Your physician recommends that you schedule a follow-up appointment in: 3 months. Please contact our office to schedule an appointment in August for a September appointment.  If you have any questions or concerns before your next appointment please send [place] a message through [place] or call our office at [phone].    TO LEAVE A MESSAGE FOR THE NURSE SELECT OPTION 2, PLEASE LEAVE A MESSAGE INCLUDING: . YOUR NAME . DATE OF BIRTH . CALL BACK NUMBER . REASON FOR CALL**this is important as we prioritize the call backs  YOU WILL RECEIVE A CALL BACK THE SAME DAY AS LONG AS YOU CALL BEFORE 4:00 PM   Do the following things EVERYDAY: 1) Weigh yourself in the morning before breakfast. Write it down and keep it in a log. 2) Take your medicines as prescribed 3) Eat low salt foods--Limit salt (sodium) to 2000 mg per day.  4) Stay as active as you can everyday 5) Limit all fluids for the day to less than 2 liters   At the [facility], you and your health needs are our priority. As part of our continuing mission to provide you with exceptional heart care, we have created designated Provider Care Teams. These Care Teams include your primary Cardiologist (physician) and Advanced Practice Providers (APPs- Physician Assistants and Nurse Practitioners) who all work together to provide you with the care you need, when you need it.   You may see any of the following providers on your designated Care Team at your next follow up: Marland Kitchen Dr Glori Bickers . Dr Loralie Champagne . Darrick Grinder, NP . Lyda Jester, PA . Audry Riles, PharmD   Please be sure to bring in all your medications bottles to every  appointment.

## 2019-11-03 ENCOUNTER — Ambulatory Visit (INDEPENDENT_AMBULATORY_CARE_PROVIDER_SITE_OTHER): Payer: PPO

## 2019-11-03 DIAGNOSIS — Z7901 Long term (current) use of anticoagulants: Secondary | ICD-10-CM

## 2019-11-03 LAB — POCT INR: INR: 1.9 — AB (ref 2.0–3.0)

## 2019-11-03 NOTE — Patient Instructions (Addendum)
Pre visit review using our clinic review tool, if applicable. No additional management support is needed unless otherwise documented below in the visit note.  Increase dose today to take 4mg  then change weekly dose to take 3mg  daily except take 2mg  Mon, Wed, and Fri. Recheck at next office visit with with Debbie.

## 2019-11-04 NOTE — Progress Notes (Signed)
I have reviewed this visit and I agree on the patient's plan of dosage and recommendations. Chibuikem Thang B Skyelar Halliday, FNP   

## 2019-11-10 ENCOUNTER — Ambulatory Visit: Payer: PPO | Admitting: Family Medicine

## 2019-11-10 ENCOUNTER — Telehealth: Payer: PPO

## 2019-11-10 ENCOUNTER — Ambulatory Visit: Payer: PPO | Admitting: Family

## 2019-11-10 ENCOUNTER — Encounter: Payer: Self-pay | Admitting: Family

## 2019-11-10 ENCOUNTER — Encounter: Payer: Self-pay | Admitting: Emergency Medicine

## 2019-11-10 ENCOUNTER — Other Ambulatory Visit: Payer: Self-pay

## 2019-11-10 ENCOUNTER — Emergency Department: Payer: PPO

## 2019-11-10 VITALS — Ht 66.0 in | Wt 145.6 lb

## 2019-11-10 DIAGNOSIS — Z7901 Long term (current) use of anticoagulants: Secondary | ICD-10-CM | POA: Diagnosis not present

## 2019-11-10 DIAGNOSIS — Z95810 Presence of automatic (implantable) cardiac defibrillator: Secondary | ICD-10-CM | POA: Diagnosis not present

## 2019-11-10 DIAGNOSIS — L03119 Cellulitis of unspecified part of limb: Secondary | ICD-10-CM

## 2019-11-10 DIAGNOSIS — I5032 Chronic diastolic (congestive) heart failure: Secondary | ICD-10-CM | POA: Insufficient documentation

## 2019-11-10 DIAGNOSIS — I130 Hypertensive heart and chronic kidney disease with heart failure and stage 1 through stage 4 chronic kidney disease, or unspecified chronic kidney disease: Secondary | ICD-10-CM | POA: Diagnosis not present

## 2019-11-10 DIAGNOSIS — L02619 Cutaneous abscess of unspecified foot: Secondary | ICD-10-CM

## 2019-11-10 DIAGNOSIS — L03116 Cellulitis of left lower limb: Secondary | ICD-10-CM | POA: Diagnosis not present

## 2019-11-10 DIAGNOSIS — Z79899 Other long term (current) drug therapy: Secondary | ICD-10-CM | POA: Insufficient documentation

## 2019-11-10 DIAGNOSIS — L02612 Cutaneous abscess of left foot: Secondary | ICD-10-CM | POA: Diagnosis not present

## 2019-11-10 DIAGNOSIS — I4821 Permanent atrial fibrillation: Secondary | ICD-10-CM | POA: Insufficient documentation

## 2019-11-10 DIAGNOSIS — I2510 Atherosclerotic heart disease of native coronary artery without angina pectoris: Secondary | ICD-10-CM | POA: Insufficient documentation

## 2019-11-10 DIAGNOSIS — N183 Chronic kidney disease, stage 3 unspecified: Secondary | ICD-10-CM | POA: Diagnosis not present

## 2019-11-10 DIAGNOSIS — R600 Localized edema: Secondary | ICD-10-CM | POA: Diagnosis not present

## 2019-11-10 DIAGNOSIS — L02416 Cutaneous abscess of left lower limb: Secondary | ICD-10-CM | POA: Diagnosis present

## 2019-11-10 LAB — CBC WITH DIFFERENTIAL/PLATELET
Abs Immature Granulocytes: 0.02 10*3/uL (ref 0.00–0.07)
Basophils Absolute: 0 10*3/uL (ref 0.0–0.1)
Basophils Relative: 1 %
Eosinophils Absolute: 0.1 10*3/uL (ref 0.0–0.5)
Eosinophils Relative: 1 %
HCT: 32 % — ABNORMAL LOW (ref 36.0–46.0)
Hemoglobin: 10.4 g/dL — ABNORMAL LOW (ref 12.0–15.0)
Immature Granulocytes: 0 %
Lymphocytes Relative: 17 %
Lymphs Abs: 1 10*3/uL (ref 0.7–4.0)
MCH: 26.8 pg (ref 26.0–34.0)
MCHC: 32.5 g/dL (ref 30.0–36.0)
MCV: 82.5 fL (ref 80.0–100.0)
Monocytes Absolute: 0.9 10*3/uL (ref 0.1–1.0)
Monocytes Relative: 16 %
Neutro Abs: 3.8 10*3/uL (ref 1.7–7.7)
Neutrophils Relative %: 65 %
Platelets: 188 10*3/uL (ref 150–400)
RBC: 3.88 MIL/uL (ref 3.87–5.11)
RDW: 19.6 % — ABNORMAL HIGH (ref 11.5–15.5)
WBC: 5.8 10*3/uL (ref 4.0–10.5)
nRBC: 0 % (ref 0.0–0.2)

## 2019-11-10 LAB — COMPREHENSIVE METABOLIC PANEL
ALT: 12 U/L (ref 0–44)
AST: 20 U/L (ref 15–41)
Albumin: 4.2 g/dL (ref 3.5–5.0)
Alkaline Phosphatase: 58 U/L (ref 38–126)
Anion gap: 14 (ref 5–15)
BUN: 58 mg/dL — ABNORMAL HIGH (ref 8–23)
CO2: 33 mmol/L — ABNORMAL HIGH (ref 22–32)
Calcium: 9.1 mg/dL (ref 8.9–10.3)
Chloride: 92 mmol/L — ABNORMAL LOW (ref 98–111)
Creatinine, Ser: 1.79 mg/dL — ABNORMAL HIGH (ref 0.44–1.00)
GFR calc Af Amer: 30 mL/min — ABNORMAL LOW (ref >60)
GFR calc non Af Amer: 26 mL/min — ABNORMAL LOW (ref >60)
Glucose, Bld: 92 mg/dL (ref 70–99)
Potassium: 3.8 mmol/L (ref 3.5–5.1)
Sodium: 139 mmol/L (ref 135–145)
Total Bilirubin: 0.9 mg/dL (ref 0.3–1.2)
Total Protein: 7.1 g/dL (ref 6.5–8.1)

## 2019-11-10 LAB — LACTIC ACID, PLASMA: Lactic Acid, Venous: 0.6 mmol/L (ref 0.5–1.9)

## 2019-11-10 NOTE — Progress Notes (Signed)
Office Visit Note   Patient: [name]           Date of Birth: 1934-01-16           MRN: [id number] Visit Date: 11/10/2019              Requested by: Elby Beck, [place],  [place] [address] PCP: Elby Beck, FNP  Chief Complaint  Patient presents with  . Left Foot - Pain, Follow-up      HPI: Patient is an 86 year old woman who presents today with a concern for a new ulcer to her left foot.  This has been ongoing for several days.  She first noticed the wound with swelling several days ago has had increasing pain surrounding this now is having increased swelling and erythema to her leg.  Feels that she cannot tolerate her compression stockings because of pain.  She is unsure why the swelling has worsened she states she has not had any new medication changes she has not altered her diet  Of note she does follow with cardiology and is currently receiving iron infusions  Assessment & Plan: Visit Diagnoses:  1. Cellulitis and abscess of foot     Plan: Abscess is free draining now she will report to the emergency department recommended she report to ED. Likely requires course of IV abx.   Follow-Up Instructions: Return for hospitalization follow up.   Ortho Exam  Patient is alert, oriented, no adenopathy, well-dressed, normal affect, normal respiratory effort. On examination of the left lower extremity she has tender pitting edema up to the tibial tubercle.  There is erythema and warmth from the toes to mid shin.  Does not tolerate palpation or examination over the dorsal lateral foot she has a dime sized abscess that is proud about 5 mm.  There is fluctuance.  Some dried bloody drainage surrounding after cleansing this area with iodine sticks it is free draining of purluent and bloody fluid.   Imaging: No results found. No images are attached to the encounter.  Labs: Lab Results  Component Value Date   HGBA1C 6.1 (H) 08/17/2014    HGBA1C 5.6 01/14/2014   ESRSEDRATE 2 02/10/2012   REPTSTATUS 11/06/2018 FINAL 11/04/2018   GRAMSTAIN NO WBC SEEN NO ORGANISMS SEEN  02/21/2016   CULT (A) 11/04/2018    <10,000 COLONIES/mL INSIGNIFICANT GROWTH Performed at [facility], [place] [address], [place], [place] [address]    LABORGA ESCHERICHIA COLI 12/24/2016     Lab Results  Component Value Date   ALBUMIN 3.7 05/06/2019   ALBUMIN 3.0 (L) 03/03/2019   ALBUMIN 3.0 (L) 03/02/2019    Lab Results  Component Value Date   MG 2.1 03/10/2019   MG 2.1 03/08/2019   MG 2.4 03/05/2019   No results found for: VD25OH  No results found for: PREALBUMIN CBC EXTENDED Latest Ref Rng & Units 09/28/2019 09/15/2019 08/11/2019  WBC 4.0 - 10.5 K/uL 3.5(L) 4.2 3.8(L)  RBC 3.87 - 5.11 MIL/uL 3.45(L) 3.56(L) 4.13  HGB 12.0 - 15.0 g/dL 8.2(L) 9.3(L) 10.8(L)  HCT 36 - 46 % 28.7(L) 29.1(L) 32.8(L)  PLT 150 - 400 K/uL 223 248.0 206.0  NEUTROABS 1.4 - 7.7 K/uL - 2.9 2.1  LYMPHSABS 0.7 - 4.0 K/uL - 0.7 0.9     Body mass index is 23.5 kg/m.  Orders:  No orders of the defined types were placed in this encounter.  No orders of the defined types were placed  in this encounter.    Procedures: No procedures performed  Clinical Data: No additional findings.  ROS:  All other systems negative, except as noted in the HPI. Review of Systems  Constitutional: Negative for chills and fever.  Cardiovascular: Positive for leg swelling.  Skin: Positive for color change and wound.    Objective: Vital Signs: Ht 5\' 6"  (1.676 m)   Wt 145 lb 9.6 oz (66 kg)   BMI 23.50 kg/m   Specialty Comments:  No specialty comments available.  PMFS History: Patient Active Problem List   Diagnosis Date Noted  . Chronic venous hypertension w ulceration ([place])   . Melena   . AKI (acute kidney injury) ([place]) 03/03/2019  . Symptomatic anemia 02/25/2019  . Acute on chronic diastolic CHF (congestive heart failure), NYHA class 4 ([place]) 11/04/2018  . Acute  blood loss anemia 11/04/2018  . Chronic bilateral pleural effusions 11/04/2018  . Shortness of breath 10/21/2018  . Displaced fracture of left femoral neck ([place]) 10/09/2018  . Somnolence   . Closed left hip fracture ([place]) 10/07/2018  . Fall   . Hypokalemia   . Afib ([place]) 06/01/2018  . Urinary frequency 09/18/2017  . Iliac artery stenosis, bilateral ([place]) 07/14/2017  . Recurrent UTI 07/14/2017  . Long term (current) use of anticoagulants 05/08/2017  . Anemia 11/18/2016  . Dysuria 11/18/2016  . Pulmonary hypertension ([place]) 12/04/2015  . B12 deficiency 05/22/2015  . PAD (peripheral artery disease) ([place])   . Elevated serum creatinine   . Renal cell carcinoma ([place])   . S/p nephrectomy   . TIA (transient ischemic attack) 08/16/2014  . Weakness 01/14/2014  . Chronic kidney disease (CKD), stage IV (severe) ([place]) 12/08/2013  . Preoperative cardiovascular examination 06/07/2013  . Chronic diastolic CHF (congestive heart failure) ([place]) 06/04/2013  . Mitral valve regurgitation 06/04/2013  . Allergic rhinitis 01/01/2013  . Chronic cholecystitis with calculus 09/15/2012  . Claudication ([place]) 01/21/2012  . Dizziness 11/26/2011  . Hypertension 05/20/2011  . Avascular necrosis of hip ([place]) 05/03/2011  . OTHER AND UNSPECIFIED COAGULATION DEFECTS 10/13/2009  . RECTAL BLEEDING 10/13/2009  . HOCM / IHSS 07/14/2009  . EDEMA 07/14/2009  . Hyperlipidemia 04/10/2009  . ADJUSTMENT DISORDER WITH ANXIETY 04/10/2009  . INCONTINENCE, URGE 04/10/2009  . Permanent atrial fibrillation 10/27/2008  . SINUS BRADYCARDIA 10/27/2008  . Carotid artery stenosis 10/27/2008  . COPD (chronic obstructive pulmonary disease) ([place]) 10/27/2008  . ICD (implantable cardioverter-defibrillator) in place 10/27/2008   Past Medical History:  Diagnosis Date  . Adjustment disorder with anxiety   . Anginal pain ([name])   . Arthritis    "some in my hands" (11/11/2012)  . Atrial fibrillation ([place])   . Automatic implantable  cardioverter-defibrillator in situ   . Blood in stool   . Carotid artery stenosis 09/2007   a. 09/2007: 60-79% bilateral (stable); b. 10/2008: 40-59% R 60-79%   . Chest pain, unspecified   . Chronic airway obstruction, not elsewhere classified   . Chronic bronchitis ([place])    "get it some; not q year" (11/11/2012)  . Chronic diastolic CHF (congestive heart failure) ([place]) 06/04/2013  . Chronic kidney disease, unspecified   . Coronary artery disease    non-obstructive by 2006 cath  . Frequent UTI    "get them a couple times/yr" (11/11/2012)  . Hemorrhage of rectum and anus   . High cholesterol   . History of blood transfusion 04/2011   "after hip OR" (11/11/2012)  . Hypertension 05/20/2011  . Hypertr obst cardiomyop   .  Hypotension, unspecified    cardiac cath 2006..nonobstructive CAD 30-40s lesions.Marland KitchenETT 1/09 nondiagnostic due to poor HR response..Right Renal Cancer 2003  . Long term (current) use of anticoagulants   . Malignant neoplasm of kidney, except pelvis   . Osteoarthritis of right hip   . Other and unspecified coagulation defects   . PONV (postoperative nausea and vomiting)   . Renal cancer ([place]) 06/2001   Right  . Secondary cardiomyopathy, unspecified   . Sinus bradycardia   . Urge incontinence     Family History  Problem Relation Age of Onset  . Heart failure Mother   . Breast cancer Maternal Aunt 82  . Breast cancer Cousin   . Breast cancer Other   . Colon cancer Neg Hx     Past Surgical History:  Procedure Laterality Date  . ABDOMINAL HYSTERECTOMY  1975   for benign causes  . APPENDECTOMY    . BI-VENTRICULAR PACEMAKER UPGRADE  05/04/2010  . BIOPSY  02/28/2019   Procedure: BIOPSY;  Surgeon: Milus Banister, MD;  Location: [facility] ENDOSCOPY;  Service: Endoscopy;;  . CARDIAC CATHETERIZATION  2006  . CARDIOVERSION N/A 02/20/2018   Procedure: CARDIOVERSION;  Surgeon: Minna Merritts, MD;  Location: [facility]S;  Service: Cardiovascular;  Laterality: N/A;  . CARDIOVERSION N/A  03/27/2018   Procedure: CARDIOVERSION;  Surgeon: Minna Merritts, MD;  Location: [facility] ORS;  Service: Cardiovascular;  Laterality: N/A;  . CATARACT EXTRACTION W/ INTRAOCULAR LENS  IMPLANT, BILATERAL  01/2006-02-2006  . CHOLECYSTECTOMY N/A 11/11/2012   Procedure: LAPAROSCOPIC CHOLECYSTECTOMY WITH INTRAOPERATIVE CHOLANGIOGRAM;  Surgeon: Imogene Burn. Georgette Dover, MD;  Location: [facility];  Service: General;  Laterality: N/A;  . COLONOSCOPY WITH PROPOFOL N/A 02/28/2019   Procedure: COLONOSCOPY WITH PROPOFOL;  Surgeon: Milus Banister, MD;  Location: [facility] ENDOSCOPY;  Service: Endoscopy;  Laterality: N/A;  . EP IMPLANTABLE DEVICE N/A 02/21/2016   Procedure: ICD Generator Changeout;  Surgeon: Deboraha Sprang, MD;  Location: [facility];  Service: Cardiovascular;  Laterality: N/A;  . ESOPHAGOGASTRODUODENOSCOPY (EGD) WITH PROPOFOL N/A 02/28/2019   Procedure: ESOPHAGOGASTRODUODENOSCOPY (EGD) WITH PROPOFOL;  Surgeon: Milus Banister, MD;  Location: [facility] ENDOSCOPY;  Service: Endoscopy;  Laterality: N/A;  . GIVENS CAPSULE STUDY N/A 03/15/2019   Procedure: GIVENS CAPSULE STUDY;  Surgeon: Thornton Park, MD;  Location: [facility];  Service: Gastroenterology;  Laterality: N/A;  . INSERT / REPLACE / REMOVE PACEMAKER  05-01-11   02-28-05-/05-04-10-ICD-MEDTRONIC MAXIMAL DR  . JOINT REPLACEMENT    . LAPAROSCOPIC CHOLECYSTECTOMY  11/11/2012  . LAPAROSCOPIC LYSIS OF ADHESIONS N/A 11/11/2012   Procedure: LAPAROSCOPIC LYSIS OF ADHESIONS;  Surgeon: Imogene Burn. Georgette Dover, MD;  Location: [facility];  Service: General;  Laterality: N/A;  . NEPHRECTOMY Right 06/2001    S/P RENAL CELL CANCER  . PRESSURE SENSOR/CARDIOMEMS N/A 02/03/2019   Procedure: PRESSURE SENSOR/CARDIOMEMS;  Surgeon: Larey Dresser, MD;  Location: [facility];  Service: Cardiovascular;  Laterality: N/A;  . RIGHT HEART CATH N/A 11/09/2018   Procedure: RIGHT HEART CATH;  Surgeon: Larey Dresser, MD;  Location: [facility];  Service: Cardiovascular;  Laterality:  N/A;  . RIGHT HEART CATH N/A 03/08/2019   Procedure: RIGHT HEART CATH;  Surgeon: Larey Dresser, MD;  Location: [facility];  Service: Cardiovascular;  Laterality: N/A;  . SUBMUCOSAL TATTOO INJECTION  02/28/2019   Procedure: SUBMUCOSAL TATTOO INJECTION;  Surgeon: Milus Banister, MD;  Location: [facility] END[facility]COPY;  Service: Endoscopy;;  . TOTAL HIP ARTHROPLASTY Right 05/03/2011   Procedure:  TOTAL HIP ARTHROPLASTY ANTERIOR APPROACH;  Surgeon: Mcarthur Rossetti;  Location: [facility] ORS;  Service: Orthopedics;  Laterality: Right;  Removal of Cannulated Screws Right Hip, Right Direct Anterior Hip Replacement  . TOTAL HIP ARTHROPLASTY Left 10/09/2018   Procedure: TOTAL HIP ARTHROPLASTY ANTERIOR APPROACH;  Surgeon: Rod Can, MD;  Location: [facility];  Service: Orthopedics;  Laterality: Left;   Social History   Occupational History  . Occupation: Retired    Fish farm manager: RETIRED  Tobacco Use  . Smoking status: Former Smoker    Packs/day: 0.50    Years: 40.00    Pack years: 20.00    Types: Cigarettes    Quit date: 05/06/2001    Years since quitting: 18.5  . Smokeless tobacco: Never Used  Vaping Use  . Vaping Use: Never used  Substance and Sexual Activity  . Alcohol use: No    Alcohol/week: 0.0 standard drinks  . Drug use: No  . Sexual activity: Never

## 2019-11-10 NOTE — ED Triage Notes (Signed)
Pt sent by PCP for left foot abscess with concern of cellulitis. Pt reports the PCP drained are today. Redness noted that travels up the left lower leg. No fever reported.

## 2019-11-11 ENCOUNTER — Observation Stay
Admission: EM | Admit: 2019-11-11 | Discharge: 2019-11-11 | Disposition: A | Discharge status: Home / Self Care | Payer: PPO | Attending: Internal Medicine | Admitting: Internal Medicine

## 2019-11-11 DIAGNOSIS — I428 Other cardiomyopathies: Secondary | ICD-10-CM

## 2019-11-11 DIAGNOSIS — N189 Chronic kidney disease, unspecified: Secondary | ICD-10-CM

## 2019-11-11 DIAGNOSIS — I5032 Chronic diastolic (congestive) heart failure: Secondary | ICD-10-CM | POA: Diagnosis not present

## 2019-11-11 DIAGNOSIS — L03116 Cellulitis of left lower limb: Secondary | ICD-10-CM | POA: Diagnosis not present

## 2019-11-11 DIAGNOSIS — N183 Chronic kidney disease, stage 3 unspecified: Secondary | ICD-10-CM | POA: Diagnosis not present

## 2019-11-11 DIAGNOSIS — I4821 Permanent atrial fibrillation: Secondary | ICD-10-CM | POA: Diagnosis present

## 2019-11-11 DIAGNOSIS — J449 Chronic obstructive pulmonary disease, unspecified: Secondary | ICD-10-CM | POA: Diagnosis present

## 2019-11-11 DIAGNOSIS — Z905 Acquired absence of kidney: Secondary | ICD-10-CM

## 2019-11-11 DIAGNOSIS — N179 Acute kidney failure, unspecified: Secondary | ICD-10-CM

## 2019-11-11 DIAGNOSIS — I422 Other hypertrophic cardiomyopathy: Secondary | ICD-10-CM

## 2019-11-11 DIAGNOSIS — Z8719 Personal history of other diseases of the digestive system: Secondary | ICD-10-CM

## 2019-11-11 DIAGNOSIS — Z95810 Presence of automatic (implantable) cardiac defibrillator: Secondary | ICD-10-CM | POA: Diagnosis present

## 2019-11-11 DIAGNOSIS — N1832 Chronic kidney disease, stage 3b: Secondary | ICD-10-CM

## 2019-11-11 DIAGNOSIS — Z7901 Long term (current) use of anticoagulants: Secondary | ICD-10-CM

## 2019-11-11 DIAGNOSIS — I739 Peripheral vascular disease, unspecified: Secondary | ICD-10-CM | POA: Diagnosis present

## 2019-11-11 LAB — URINALYSIS, COMPLETE (UACMP) WITH MICROSCOPIC
Bacteria, UA: NONE SEEN
Bilirubin Urine: NEGATIVE
Glucose, UA: NEGATIVE mg/dL
Hgb urine dipstick: NEGATIVE
Ketones, ur: NEGATIVE mg/dL
Nitrite: NEGATIVE
Protein, ur: NEGATIVE mg/dL
Specific Gravity, Urine: 1.011 (ref 1.005–1.030)
Squamous Epithelial / HPF: NONE SEEN (ref 0–5)
pH: 7 (ref 5.0–8.0)

## 2019-11-11 LAB — PROTIME-INR
INR: 2.1 — ABNORMAL HIGH (ref 0.8–1.2)
Prothrombin Time: 23.2 seconds — ABNORMAL HIGH (ref 11.4–15.2)

## 2019-11-11 MED ORDER — HYDROCODONE-ACETAMINOPHEN 5-325 MG PO TABS: 1.0000 | ORAL_TABLET | ORAL | Status: DC | PRN

## 2019-11-11 MED ORDER — SODIUM CHLORIDE 0.9 % IV SOLN
1.0000 g | Freq: Once | INTRAVENOUS | Status: AC
  Administered 2019-11-11: 1 g via INTRAVENOUS
  Filled 2019-11-11: qty 10

## 2019-11-11 MED ORDER — LORATADINE 10 MG PO TABS
10.0000 mg | ORAL_TABLET | Freq: Every day | ORAL | Status: DC
  Administered 2019-11-11: 10 mg via ORAL
  Filled 2019-11-11: qty 1

## 2019-11-11 MED ORDER — ACETAMINOPHEN 325 MG PO TABS
650.0000 mg | ORAL_TABLET | Freq: Once | ORAL | Status: AC
  Administered 2019-11-11: 650 mg via ORAL
  Filled 2019-11-11: qty 2

## 2019-11-11 MED ORDER — WARFARIN - PHARMACIST DOSING INPATIENT
Freq: Every day | Status: DC
  Filled 2019-11-11: qty 1

## 2019-11-11 MED ORDER — TORSEMIDE 100 MG PO TABS
100.0000 mg | ORAL_TABLET | Freq: Two times a day (BID) | ORAL | Status: DC
  Filled 2019-11-11: qty 1

## 2019-11-11 MED ORDER — EZETIMIBE 10 MG PO TABS
10.0000 mg | ORAL_TABLET | Freq: Every day | ORAL | Status: DC
  Administered 2019-11-11: 10 mg via ORAL
  Filled 2019-11-11: qty 1

## 2019-11-11 MED ORDER — DOXYCYCLINE HYCLATE 100 MG PO TABS: 100.0000 mg | ORAL_TABLET | Freq: Two times a day (BID) | ORAL | 0 refills | Status: DC

## 2019-11-11 MED ORDER — ESTROGENS, CONJUGATED 0.625 MG/GM VA CREA: 1.0000 | TOPICAL_CREAM | Freq: Every day | VAGINAL | Status: DC

## 2019-11-11 MED ORDER — METOLAZONE 5 MG PO TABS
5.0000 mg | ORAL_TABLET | ORAL | Status: DC
  Administered 2019-11-11: 5 mg via ORAL
  Filled 2019-11-11: qty 1

## 2019-11-11 MED ORDER — ACETAMINOPHEN 325 MG PO TABS
650.0000 mg | ORAL_TABLET | Freq: Four times a day (QID) | ORAL | Status: DC | PRN
  Administered 2019-11-11: 650 mg via ORAL
  Filled 2019-11-11: qty 2

## 2019-11-11 MED ORDER — SODIUM CHLORIDE 0.9 % IV SOLN: 1.0000 g | INTRAVENOUS | Status: DC

## 2019-11-11 MED ORDER — POTASSIUM CHLORIDE CRYS ER 20 MEQ PO TBCR
40.0000 meq | EXTENDED_RELEASE_TABLET | Freq: Two times a day (BID) | ORAL | Status: DC
  Administered 2019-11-11: 40 meq via ORAL
  Filled 2019-11-11: qty 2

## 2019-11-11 MED ORDER — LEVOTHYROXINE SODIUM 50 MCG PO TABS: 25.0000 ug | ORAL_TABLET | Freq: Every day | ORAL | Status: DC

## 2019-11-11 MED ORDER — ONDANSETRON HCL 4 MG/2ML IJ SOLN: 4.0000 mg | Freq: Four times a day (QID) | INTRAMUSCULAR | Status: DC | PRN

## 2019-11-11 MED ORDER — ONDANSETRON HCL 4 MG PO TABS: 4.0000 mg | ORAL_TABLET | Freq: Four times a day (QID) | ORAL | Status: DC | PRN

## 2019-11-11 MED ORDER — WARFARIN SODIUM 2 MG PO TABS
2.0000 mg | ORAL_TABLET | Freq: Once | ORAL | Status: DC
  Filled 2019-11-11: qty 1

## 2019-11-11 MED ORDER — ACETAMINOPHEN 650 MG RE SUPP: 650.0000 mg | Freq: Four times a day (QID) | RECTAL | Status: DC | PRN

## 2019-11-11 MED ORDER — ROSUVASTATIN CALCIUM 20 MG PO TABS
40.0000 mg | ORAL_TABLET | Freq: Every day | ORAL | Status: DC
  Filled 2019-11-11: qty 2

## 2019-11-11 NOTE — Discharge Summary (Signed)
Physician Discharge Summary  [name] RFF:[id number] DOB: 08/03/1933 DOA: 11/11/2019  PCP: Elby Beck, FNP  Admit date: 11/11/2019 Discharge date: 11/11/2019  Admitted From: home Disposition:  home  Recommendations for Outpatient Follow-up:  1. Follow up with PCP in 1-2 weeks 2. Please obtain BMP/CBC in one week  Home Health: Equipment/Devices:  Discharge Condition:stable CODE STATUS:full code Diet recommendation: heart healthy  Brief/Interim Summary: HPI: ERICIA [name] is a 86 y.o. female with medical history significant for A. fib on Coumadin, diastolic heart failure, nonischemic cardiomyopathy with AICD placement, hypothyroidism, hypertension, history of GI bleed, who presents to the emergency room with pain redness and swelling of the left foot that has been increasing over the past few days, now extending up to the knee.  Patient was seen by her PCP on 11/10/2019 and found to have an abscess on the dorsum of the foot which was I&D with drainage of purulent and bloody fluid.  Patient presented to the ER due to continued pain and redness. ED Course: On arrival she was afebrile and with normal vitals.  WBC normal at 5800, creatinine 1.79 which is about her baseline.  Foot x-ray showed soft tissue edema of the foot without evidence of osteomyelitis.  Patient given Rocephin.  Hospitalist consulted for admission.  Discharge Diagnoses:  Principal Problem:   Cellulitis of left leg Active Problems:   Non-ischemic cardiomyopathy (HCC)   Permanent atrial fibrillation   COPD (chronic obstructive pulmonary disease) (HCC)   CKD (chronic kidney disease) stage 3, GFR 30-59 ml/min   ICD (implantable cardioverter-defibrillator) in place   Chronic diastolic CHF (congestive heart failure) (HCC)   PAD (peripheral artery disease) ([place])   History of nephrectomy secondary to [place]   Long term (current) use of anticoagulants   History of GI bleed  Cellulitis of left foot with  abscess -Patient is status post I&D of small abscess dorsum left foot and later on the day of arrival, during her visit to orthopedics -started on IV Rocephin in the emergency room -Keep leg elevated -overall redness has significantly improved and wound is draining -no fever or leukocytosis -will transition to oral doxycycline to complete her course  Chronic diastolic heart failure, with history of systolic heart failure secondary to nonischemic cardiomyopathy status post AICD -Stable.  Not exacerbated -Continue home torsemide.      Permanent atrial fibrillation on chronic anticoagulation with Coumadin -Patient has failed multiple antiarrhythmics and ablation in the past and follows with cardiology -continue anticoagulation with coumadin  Hypothyroidism -Continue Synthroid     CKD (chronic kidney disease) stage 3, GFR 30-59 ml/min in the setting of history of nephrectomy secondary to [place] -Renal function at baseline    History of GI bleed -No acute concerns at this time  Discharge Instructions  Discharge Instructions    Diet - low sodium heart healthy   Complete by: As directed    Increase activity slowly   Complete by: As directed      Allergies as of 11/11/2019      Reactions   Dilaudid [hydromorphone] Other (See Comments)   Excessive Somnolence- Required Narcan   Fentanyl Other (See Comments)   Required Narcan when given with Dilaudid. Tolerated single dose when given during procedure 02/03/2019.    Codeine Nausea And Vomiting   Hallucinations    Morphine And Related Nausea And Vomiting   Hallucinations    Sulfonamide Derivatives Other (See Comments)   Dry mouth      Medication List  TAKE these medications   acetaminophen 500 MG tablet Commonly known as: TYLENOL Take 500-1,000 mg by mouth every 8 (eight) hours as needed for mild pain or headache.   AZO Cranberry Gummies 500 MG Chew Generic drug: Cranberry Chew 2,000 mg by mouth daily.   bisacodyl 5 MG  EC tablet Commonly known as: DULCOLAX Take 1 tablet (5 mg total) by mouth daily as needed for moderate constipation.   cetirizine 10 MG tablet Commonly known as: ZYRTEC Take 10 mg by mouth daily.   conjugated estrogens vaginal cream Commonly known as: Premarin Place 1 Applicatorful vaginally daily.   doxycycline 100 MG tablet Commonly known as: VIBRA-TABS Take 1 tablet (100 mg total) by mouth 2 (two) times daily.   ezetimibe 10 MG tablet Commonly known as: ZETIA Take 1 tablet (10 mg total) by mouth daily.   ferrous sulfate 325 (65 FE) MG tablet Take 1 tablet (325 mg total) by mouth 2 (two) times daily with a meal.   fluticasone 50 MCG/ACT nasal spray Commonly known as: FLONASE Place 2 sprays into both nostrils daily as needed for allergies or rhinitis.   levothyroxine 25 MCG tablet Commonly known as: SYNTHROID TAKE 1 TABLET BY MOUTH ONCE DAILY BEFOREBREAKFAST   magnesium oxide 400 MG tablet Commonly known as: MAG-OX Take 1 tablet (400 mg total) by mouth daily.   metolazone 5 MG tablet Commonly known as: ZAROXOLYN Take 1 tablet (5 mg total) by mouth 2 (two) times a week. Tuesday and Saturday   potassium chloride SA 20 MEQ tablet Commonly known as: KLOR-CON Take 40 mEq by mouth 2 (two) times daily. 1 extra tab on metolazone days   rosuvastatin 40 MG tablet Commonly known as: CRESTOR Take 1 tablet (40 mg total) by mouth daily.   torsemide 100 MG tablet Commonly known as: DEMADEX Take 1 tablet (100 mg total) by mouth 2 (two) times daily.   vitamin C 250 MG tablet Commonly known as: ASCORBIC ACID Take 500 mg by mouth daily.   Vitamin D 50 MCG (2000 UT) Caps Take 4,000 Units by mouth daily.   warfarin 2 MG tablet Commonly known as: COUMADIN Take as directed. If you are unsure how to take this medication, talk to your nurse or doctor. Original instructions: TAKE 1 TABLETS BY MOUTH DAILY AT 6PM AS DIRECTED BY ANTICOAGULATION CLINIC.       Follow-up Information     Elby Beck, FNP. Schedule an appointment as soon as possible for a visit in 1 week(s).   Specialties: Nurse Practitioner, Family Medicine Contact information: [address] [place] [address] [phone]              Allergies  Allergen Reactions  . Dilaudid [Hydromorphone] Other (See Comments)    Excessive Somnolence- Required Narcan  . Fentanyl Other (See Comments)    Required Narcan when given with Dilaudid. Tolerated single dose when given during procedure 02/03/2019.   Marland Kitchen Codeine Nausea And Vomiting    Hallucinations   . Morphine And Related Nausea And Vomiting    Hallucinations   . Sulfonamide Derivatives Other (See Comments)    Dry mouth    Consultations:     Procedures/Studies: DG Foot Complete Left  Result Date: 11/10/2019 CLINICAL DATA:  Left foot abscess with diffuse erythema. EXAM: LEFT FOOT - COMPLETE 3+ VIEW COMPARISON:  None. FINDINGS: There is no evidence of fracture or dislocation. There is no evidence of arthropathy or other focal bone abnormality. Soft tissues are edematous. No osteolysis.  IMPRESSION: No radiographic evidence of osteomyelitis. Soft tissue edema of the left foot. Electronically Signed   By: Ulyses Jarred M.D.   On: 11/10/2019 20:24       Subjective: Feels that redness in left foot is better. Skin feels less tight now that abscess is draining.  Discharge Exam: Vitals:   11/10/19 2215 11/11/19 0312 11/11/19 0315 11/11/19 1404  BP: (!) 155/45 (!) 143/72  (!) 142/89  Pulse: (!) 59 (!) 57 60 88  Resp: 18 20  17   Temp:  (!) 97.5 F (36.4 C)  98 F (36.7 C)  TempSrc:  Oral  Oral  SpO2: 99% 94% 97% 100%  Weight:      Height:        General: Pt is alert, awake, not in acute distress Cardiovascular: RRR, S1/S2 +, no rubs, no gallops Respiratory: CTA bilaterally, no wheezing, no rhonchi Abdominal: Soft, NT, ND, bowel sounds + Extremities: no edema, no cyanosis      The results of significant diagnostics  from this hospitalization (including imaging, microbiology, ancillary and laboratory) are listed below for reference.     Microbiology: No results found for this or any previous visit (from the past 240 hour(s)).   Labs: BNP (last 3 results) Recent Labs    01/20/19 1451 02/24/19 2051 06/03/19 1508  BNP 313.4* 314.7* 536.1*   Basic Metabolic Panel: Recent Labs  Lab 11/10/19 1954  NA 139  K 3.8  CL 92*  CO2 33*  GLUCOSE 92  BUN 58*  CREATININE 1.79*  CALCIUM 9.1   Liver Function Tests: Recent Labs  Lab 11/10/19 1954  AST 20  ALT 12  ALKPHOS 58  BILITOT 0.9  PROT 7.1  ALBUMIN 4.2   No results for input(s): LIPASE, AMYLASE in the last 168 hours. No results for input(s): AMMONIA in the last 168 hours. CBC: Recent Labs  Lab 11/10/19 1954  WBC 5.8  NEUTROABS 3.8  HGB 10.4*  HCT 32.0*  MCV 82.5  PLT 188   Cardiac Enzymes: No results for input(s): CKTOTAL, CKMB, CKMBINDEX, TROPONINI in the last 168 hours. BNP: Invalid input(s): POCBNP CBG: No results for input(s): GLUCAP in the last 168 hours. D-Dimer No results for input(s): DDIMER in the last 72 hours. Hgb A1c No results for input(s): HGBA1C in the last 72 hours. Lipid Profile No results for input(s): CHOL, HDL, LDLCALC, TRIG, CHOLHDL, LDLDIRECT in the last 72 hours. Thyroid function studies No results for input(s): TSH, T4TOTAL, T3FREE, THYROIDAB in the last 72 hours.  Invalid input(s): FREET3 Anemia work up No results for input(s): VITAMINB12, FOLATE, FERRITIN, TIBC, IRON, RETICCTPCT in the last 72 hours. Urinalysis    Component Value Date/Time   COLORURINE STRAW (A) 11/11/2019 0647   APPEARANCEUR CLEAR (A) 11/11/2019 0647   APPEARANCEUR Cloudy (A) 01/27/2018 1330   LABSPEC 1.011 11/11/2019 0647   PHURINE 7.0 11/11/2019 0647   GLUCOSEU NEGATIVE 11/11/2019 0647   HGBUR NEGATIVE 11/11/2019 0647   HGBUR large 11/16/2009 1202   BILIRUBINUR NEGATIVE 11/11/2019 0647   BILIRUBINUR ngative  09/15/2019 1528   BILIRUBINUR Negative 01/27/2018 1330   KETONESUR NEGATIVE 11/11/2019 0647   PROTEINUR NEGATIVE 11/11/2019 0647   UROBILINOGEN 0.2 09/15/2019 1528   UROBILINOGEN 1.0 08/16/2014 1754   NITRITE NEGATIVE 11/11/2019 0647   LEUKOCYTESUR TRACE (A) 11/11/2019 0647   Sepsis Labs Invalid input(s): PROCALCITONIN,  WBC,  LACTICIDVEN Microbiology No results found for this or any previous visit (from the past 240 hour(s)).   Time coordinating discharge: 79mins  SIGNED:   Kathie Dike, MD  [facility] 11/11/2019, 7:24 PM   If 7PM-7AM, please contact night-coverage [email or web address]

## 2019-11-11 NOTE — ED Notes (Signed)
Iv started  meds given.   

## 2019-11-11 NOTE — ED Notes (Signed)
Patient said leg was getting more painful, she did not want anything strong.  Just tylenol.  Gave her tylenol

## 2019-11-11 NOTE — ED Notes (Signed)
Report called to Raymond Gurney cpod nurse

## 2019-11-11 NOTE — ED Notes (Signed)
Pt has abcsess to left foot.  Pt reports sore area for 4 days.  Pt saw doctor today and area was lanced with bloody drainage.  Pt has swelling and redness to left foot and left lower leg.  Pt alert   Speech clear.

## 2019-11-11 NOTE — Consult Note (Signed)
ANTICOAGULATION CONSULT NOTE - Follow Up Consult  Pharmacy Consult for Warfarin Dosing  Indication: atrial fibrillation  Allergies  Allergen Reactions  . Dilaudid [Hydromorphone] Other (See Comments)    Excessive Somnolence- Required Narcan  . Fentanyl Other (See Comments)    Required Narcan when given with Dilaudid. Tolerated single dose when given during procedure 02/03/2019.   Marland Kitchen Codeine Nausea And Vomiting    Hallucinations   . Morphine And Related Nausea And Vomiting    Hallucinations   . Sulfonamide Derivatives Other (See Comments)    Dry mouth   Patient Measurements: Height: 5' 6.5" (168.9 cm) Weight: 67.2 kg (148 lb 3.2 oz) IBW/kg (Calculated) : 60.45   Vital Signs: Temp: 97.5 F (36.4 C) (07/08 0312) Temp Source: Oral (07/08 0312) BP: 143/72 (07/08 0312) Pulse Rate: 60 (07/08 0315)  Labs: Recent Labs    11/10/19 1954 11/11/19 0725  HGB 10.4*  --   HCT 32.0*  --   PLT 188  --   LABPROT  --  23.2*  INR  --  2.1*  CREATININE 1.79*  --     Estimated Creatinine Clearance: 22.7 mL/min (A) (by C-G formula based on SCr of 1.79 mg/dL (H)).   Assessment: Pharmacy consulted for warfarin dosing and monitoring for 86 yo female with PMH of A.Fib.   Home Regimen: warfarin 2mg  daily  DATE INR DOSE 7/8 2.1   Goal of Therapy:  INR 2-3 Monitor platelets by anticoagulation protocol: Yes   Plan:  7/8 INR therapeutic. Will continue patient's home dose of warfarin 2mg  this evening.  Patient is receiving Ceftriaxone IV. Will plan to monitor INRs daily per protocol while on antibiotics.  CBCs at least every 3 day per protocol.   Pernell Dupre, PharmD, BCPS Clinical Pharmacist 11/11/2019 11:01 AM

## 2019-11-11 NOTE — ED Provider Notes (Signed)
[facility] Emergency Department Provider Note  ____________________________________________   First MD Initiated Contact with Patient 11/11/19 0103     (approximate)  I have reviewed the triage vital signs and the nursing notes.   HISTORY  Chief Complaint Abscess    HPI [name] is a 86 y.o. female with below list of previous medical conditions including chronic kidney disease, atrial fibrillation, hypertension and chronic diastolic congestive heart failure presents to the emergency department secondary to left lower extremity pain swelling and redness over the past 2 days.  Patient states that she noted an area on the lateral aspect of her foot for which she was seen by her primary care provider today and I&D was performed.  Patient states worsening redness and swelling since then.  Patient does admit to subjective fevers.        Past Medical History:  Diagnosis Date  . Adjustment disorder with anxiety   . Anginal pain ([place])   . Arthritis    "some in my hands" (11/11/2012)  . Atrial fibrillation ([place])   . Automatic implantable cardioverter-defibrillator in situ   . Blood in stool   . Carotid artery stenosis 09/2007   a. 09/2007: 60-79% bilateral (stable); b. 10/2008: 40-59% R 60-79%   . Chest pain, unspecified   . Chronic airway obstruction, not elsewhere classified   . Chronic bronchitis ([place])    "get it some; not q year" (11/11/2012)  . Chronic diastolic CHF (congestive heart failure) ([place]) 06/04/2013  . Chronic kidney disease, unspecified   . Coronary artery disease    non-obstructive by 2006 cath  . Frequent UTI    "get them a couple times/yr" (11/11/2012)  . Hemorrhage of rectum and anus   . High cholesterol   . History of blood transfusion 04/2011   "after hip OR" (11/11/2012)  . Hypertension 05/20/2011  . Hypertr obst cardiomyop   . Hypotension, unspecified    cardiac cath 2006..nonobstructive CAD 30-40s lesions.Marland KitchenETT 1/09  nondiagnostic due to poor HR response..Right Renal Cancer 2003  . Long term (current) use of anticoagulants   . Malignant neoplasm of kidney, except pelvis   . Osteoarthritis of right hip   . Other and unspecified coagulation defects   . PONV (postoperative nausea and vomiting)   . Renal cancer ([place]) 06/2001   Right  . Secondary cardiomyopathy, unspecified   . Sinus bradycardia   . Urge incontinence     Patient Active Problem List   Diagnosis Date Noted  . Chronic venous hypertension w ulceration ([place])   . Melena   . AKI (acute kidney injury) ([place]) 03/03/2019  . Symptomatic anemia 02/25/2019  . Acute on chronic diastolic CHF (congestive heart failure), NYHA class 4 ([place]) 11/04/2018  . Acute blood loss anemia 11/04/2018  . Chronic bilateral pleural effusions 11/04/2018  . Shortness of breath 10/21/2018  . Displaced fracture of left femoral neck ([place]) 10/09/2018  . Somnolence   . Closed left hip fracture ([place]) 10/07/2018  . Fall   . Hypokalemia   . Afib ([place]) 06/01/2018  . Urinary frequency 09/18/2017  . Iliac artery stenosis, bilateral ([place]) 07/14/2017  . Recurrent UTI 07/14/2017  . Long term (current) use of anticoagulants 05/08/2017  . Anemia 11/18/2016  . Dysuria 11/18/2016  . Pulmonary hypertension ([place]) 12/04/2015  . B12 deficiency 05/22/2015  . PAD (peripheral artery disease) ([place])   . Elevated serum creatinine   . Renal cell carcinoma ([place])   . S/p nephrectomy   . TIA (transient  ischemic attack) 08/16/2014  . Weakness 01/14/2014  . Chronic kidney disease (CKD), stage IV (severe) ([place]) 12/08/2013  . Preoperative cardiovascular examination 06/07/2013  . Chronic diastolic CHF (congestive heart failure) ([place]) 06/04/2013  . Mitral valve regurgitation 06/04/2013  . Allergic rhinitis 01/01/2013  . Chronic cholecystitis with calculus 09/15/2012  . Claudication ([place]) 01/21/2012  . Dizziness 11/26/2011  . Hypertension 05/20/2011  . Avascular necrosis of hip ([place])  05/03/2011  . OTHER AND UNSPECIFIED COAGULATION DEFECTS 10/13/2009  . RECTAL BLEEDING 10/13/2009  . HOCM / IHSS 07/14/2009  . EDEMA 07/14/2009  . Hyperlipidemia 04/10/2009  . ADJUSTMENT DISORDER WITH ANXIETY 04/10/2009  . INCONTINENCE, URGE 04/10/2009  . Permanent atrial fibrillation 10/27/2008  . SINUS BRADYCARDIA 10/27/2008  . Carotid artery stenosis 10/27/2008  . COPD (chronic obstructive pulmonary disease) ([place]) 10/27/2008  . ICD (implantable cardioverter-defibrillator) in place 10/27/2008    Past Surgical History:  Procedure Laterality Date  . ABDOMINAL HYSTERECTOMY  1975   for benign causes  . APPENDECTOMY    . BI-VENTRICULAR PACEMAKER UPGRADE  05/04/2010  . BIOPSY  02/28/2019   Procedure: BIOPSY;  Surgeon: Milus Banister, MD;  Location: [facility] ENDOSCOPY;  Service: Endoscopy;;  . CARDIAC CATHETERIZATION  2006  . CARDIOVERSION N/A 02/20/2018   Procedure: CARDIOVERSION;  Surgeon: Minna Merritts, MD;  Location: [place] ORS;  Service: Cardiovascular;  Laterality: N/A;  . CARDIOVERSION N/A 03/27/2018   Procedure: CARDIOVERSION;  Surgeon: Minna Merritts, MD;  Location: [facility] ORS;  Service: Cardiovascular;  Laterality: N/A;  . CATARACT EXTRACTION W/ INTRAOCULAR LENS  IMPLANT, BILATERAL  01/2006-02-2006  . CHOLECYSTECTOMY N/A 11/11/2012   Procedure: LAPAROSCOPIC CHOLECYSTECTOMY WITH INTRAOPERATIVE CHOLANGIOGRAM;  Surgeon: Imogene Burn. Georgette Dover, MD;  Location: [place];  Service: General;  Laterality: N/A;  . COLONOSCOPY WITH PROPOFOL N/A 02/28/2019   Procedure: COLONOSCOPY WITH PROPOFOL;  Surgeon: Milus Banister, MD;  Location: [facility] ENDOSCOPY;  Service: Endoscopy;  Laterality: N/A;  . EP IMPLANTABLE DEVICE N/A 02/21/2016   Procedure: ICD Generator Changeout;  Surgeon: Deboraha Sprang, MD;  Location: [place] CV LAB;  Service: Cardiovascular;  Laterality: N/A;  . ESOPHAGOGASTRODUODENOSCOPY (EGD) WITH PROPOFOL N/A 02/28/2019   Procedure: ESOPHAGOGASTRODUODENOSCOPY (EGD) WITH PROPOFOL;   Surgeon: Milus Banister, MD;  Location: [facility] ENDOSCOPY;  Service: Endoscopy;  Laterality: N/A;  . GIVENS CAPSULE STUDY N/A 03/15/2019   Procedure: GIVENS CAPSULE STUDY;  Surgeon: Thornton Park, MD;  Location: [place];  Service: Gastroenterology;  Laterality: N/A;  . INSERT / REPLACE / REMOVE PACEMAKER  05-01-11   02-28-05-/05-04-10-ICD-MEDTRONIC MAXIMAL DR  . JOINT REPLACEMENT    . LAPAROSCOPIC CHOLECYSTECTOMY  11/11/2012  . LAPAROSCOPIC LYSIS OF ADHESIONS N/A 11/11/2012   Procedure: LAPAROSCOPIC LYSIS OF ADHESIONS;  Surgeon: Imogene Burn. Georgette Dover, MD;  Location: [place];  Service: General;  Laterality: N/A;  . NEPHRECTOMY Right 06/2001    S/P RENAL CELL CANCER  . PRESSURE SENSOR/CARDIOMEMS N/A 02/03/2019   Procedure: PRESSURE SENSOR/CARDIOMEMS;  Surgeon: Larey Dresser, MD;  Location: [place] CV LAB;  Service: Cardiovascular;  Laterality: N/A;  . RIGHT HEART CATH N/A 11/09/2018   Procedure: RIGHT HEART CATH;  Surgeon: Larey Dresser, MD;  Location: [place] CV LAB;  Service: Cardiovascular;  Laterality: N/A;  . RIGHT HEART CATH N/A 03/08/2019   Procedure: RIGHT HEART CATH;  Surgeon: Larey Dresser, MD;  Location: [place] CV LAB;  Service: Cardiovascular;  Laterality: N/A;  . SUBMUCOSAL TATTOO INJECTION  02/28/2019   Procedure: SUBMUCOSAL TATTOO INJECTION;  Surgeon: Milus Banister, MD;  Location: [place] ENDOSCOPY;  Service: Endoscopy;;  . TOTAL HIP ARTHROPLASTY Right 05/03/2011   Procedure: TOTAL HIP ARTHROPLASTY ANTERIOR APPROACH;  Surgeon: Mcarthur Rossetti;  Location: [facility] ORS;  Service: Orthopedics;  Laterality: Right;  Removal of Cannulated Screws Right Hip, Right Direct Anterior Hip Replacement  . TOTAL HIP ARTHROPLASTY Left 10/09/2018   Procedure: TOTAL HIP ARTHROPLASTY ANTERIOR APPROACH;  Surgeon: Rod Can, MD;  Location: [place];  Service: Orthopedics;  Laterality: Left;    Prior to Admission medications   Medication Sig Start Date End Date Taking? Authorizing Provider   acetaminophen (TYLENOL) 500 MG tablet Take 500-1,000 mg by mouth every 8 (eight) hours as needed for mild pain or headache.     [provider]  bisacodyl (DULCOLAX) 5 MG EC tablet Take 1 tablet (5 mg total) by mouth daily as needed for moderate constipation. 10/12/18   Aline August, MD  cetirizine (ZYRTEC) 10 MG tablet Take 10 mg by mouth daily.     [provider]  Cholecalciferol (VITAMIN D) 50 MCG (2000 UT) CAPS Take 4,000 Units by mouth daily.    [provider]  conjugated estrogens (PREMARIN) vaginal cream Place 1 Applicatorful vaginally daily. 07/31/18   Elby Beck, FNP  Cranberry (AZO CRANBERRY GUMMIES) 500 MG CHEW Chew 2,000 mg by mouth daily.    [provider]  ezetimibe (ZETIA) 10 MG tablet Take 1 tablet (10 mg total) by mouth daily. 08/18/17   Minna Merritts, MD  ferrous sulfate 325 (65 FE) MG tablet Take 1 tablet (325 mg total) by mouth 2 (two) times daily with a meal. 03/17/19   Shelly Coss, MD  fluticasone (FLONASE) 50 MCG/ACT nasal spray Place 2 sprays into both nostrils daily as needed for allergies or rhinitis. 07/27/19   Elby Beck, FNP  levothyroxine (SYNTHROID) 25 MCG tablet TAKE 1 TABLET BY MOUTH ONCE DAILY BEFOREBREAKFAST 04/19/19   Deboraha Sprang, MD  magnesium oxide (MAG-OX) 400 MG tablet Take 1 tablet (400 mg total) by mouth daily. 06/05/18   Deboraha Sprang, MD  metolazone (ZAROXOLYN) 5 MG tablet Take 1 tablet (5 mg total) by mouth 2 (two) times a week. Tuesday and Saturday 11/04/19   Darrick Grinder D, NP  potassium chloride SA (KLOR-CON) 20 MEQ tablet Take 40 mEq by mouth 2 (two) times daily. 1 extra tab on metolazone days    [provider]  rosuvastatin (CRESTOR) 40 MG tablet Take 1 tablet (40 mg total) by mouth daily. 11/11/18   Larey Dresser, MD  torsemide (DEMADEX) 100 MG tablet Take 1 tablet (100 mg total) by mouth 2 (two) times daily. 06/14/19   Clegg, Amy D, NP  vitamin C (ASCORBIC ACID) 250 MG tablet  Take 500 mg by mouth daily.    [provider]  warfarin (COUMADIN) 2 MG tablet TAKE 1 TABLETS BY MOUTH DAILY AT 6PM AS DIRECTED BY ANTICOAGULATION CLINIC. 09/27/19   Elby Beck, FNP    Allergies Dilaudid [hydromorphone], Fentanyl, Codeine, Morphine and related, and Sulfonamide derivatives  Family History  Problem Relation Age of Onset  . Heart failure Mother   . Breast cancer Maternal Aunt 82  . Breast cancer Cousin   . Breast cancer Other   . Colon cancer Neg Hx     Social History Social History   Tobacco Use  . Smoking status: Former Smoker    Packs/day: 0.50    Years: 40.00    Pack years: 20.00    Types: Cigarettes  Quit date: 05/06/2001    Years since quitting: 18.5  . Smokeless tobacco: Never Used  Vaping Use  . Vaping Use: Never used  Substance Use Topics  . Alcohol use: No    Alcohol/week: 0.0 standard drinks  . Drug use: No    Review of Systems Constitutional: No fever/chills Eyes: No visual changes. ENT: No sore throat. Cardiovascular: Denies chest pain. Respiratory: Denies shortness of breath. Gastrointestinal: No abdominal pain.  No nausea, no vomiting.  No diarrhea.  No constipation. Genitourinary: Negative for dysuria. Musculoskeletal: Negative for neck pain.  Negative for back pain.  Positive for left lower extremity pain swelling and redness Integumentary: Negative for rash. Neurological: Negative for headaches, focal weakness or numbness.   ____________________________________________   PHYSICAL EXAM:  VITAL SIGNS: ED Triage Vitals  Enc Vitals Group     BP 11/10/19 1938 (!) 127/52     Pulse Rate 11/10/19 1938 60     Resp 11/10/19 1938 18     Temp 11/10/19 1938 98.1 F (36.7 C)     Temp Source 11/10/19 1938 Oral     SpO2 11/10/19 1938 96 %     Weight 11/10/19 1939 67.2 kg (148 lb 3.2 oz)     Height 11/10/19 1939 1.689 m (5' 6.5")     Head Circumference --      Peak Flow --      Pain Score 11/10/19 2215 5     Pain Loc  --      Pain Edu? --      Excl. in [place]? --     Constitutional: Alert and oriented.  Eyes: Conjunctivae are normal.  Mouth/Throat: Patient is wearing a mask. Neck: No stridor.  No meningeal signs.   Cardiovascular: Normal rate, regular rhythm. Good peripheral circulation. Grossly normal heart sounds. Respiratory: Normal respiratory effort.  No retractions. Gastrointestinal: Soft and nontender. No distention.  Musculoskeletal: No lower extremity tenderness nor edema. No gross deformities of extremities. Neurologic:  Normal speech and language. No gross focal neurologic deficits are appreciated.  Skin: Left leg swelling redness hot to touch.  Blanching erythema extending to just inferior to the knee. Psychiatric: Mood and affect are normal. Speech and behavior are normal.  ____________________________________________   LABS (all labs ordered are listed, but only abnormal results are displayed)  Labs Reviewed  COMPREHENSIVE METABOLIC PANEL - Abnormal; Notable for the following components:      Result Value   Chloride 92 (*)    CO2 33 (*)    BUN 58 (*)    Creatinine, Ser 1.79 (*)    GFR calc non Af Amer 26 (*)    GFR calc Af Amer 30 (*)    All other components within normal limits  CBC WITH DIFFERENTIAL/PLATELET - Abnormal; Notable for the following components:   Hemoglobin 10.4 (*)    HCT 32.0 (*)    RDW 19.6 (*)    All other components within normal limits  LACTIC ACID, PLASMA  URINALYSIS, COMPLETE (UACMP) WITH MICROSCOPIC     RADIOLOGY I, Rock N Claudia Greenley, personally viewed and evaluated these images (plain radiographs) as part of my medical decision making, as well as reviewing the written report by the radiologist.  ED MD interpretation: No radiographic evidence of osteomyelitis soft tissue edema of the left foot on x-ray per radiology  Official radiology report(s): DG Foot Complete Left  Result Date: 11/10/2019 CLINICAL DATA:  Left foot abscess with diffuse erythema.  EXAM: LEFT FOOT - COMPLETE 3+ VIEW COMPARISON:  None. FINDINGS: There is no evidence of fracture or dislocation. There is no evidence of arthropathy or other focal bone abnormality. Soft tissues are edematous. No osteolysis. IMPRESSION: No radiographic evidence of osteomyelitis. Soft tissue edema of the left foot. Electronically Signed   By: Ulyses Jarred M.D.   On: 11/10/2019 20:24      Procedures   ____________________________________________   INITIAL IMPRESSION / MDM / [place] / ED COURSE  As part of my medical decision making, I reviewed the following data within the [place] NUMBER  86 year old female presented with above-stated history and physical exam consistent with cellulitis of the left lower extremity.  Patient given IV ceftriaxone with plan for admission for further evaluation and management.  ____________________________________________  FINAL CLINICAL IMPRESSION(S) / ED DIAGNOSES  Final diagnoses:  Left leg cellulitis     MEDICATIONS GIVEN DURING THIS VISIT:  Medications - No data to display   ED Discharge Orders    None      *Please note:  [name] was evaluated in Emergency Department on 11/11/2019 for the symptoms described in the history of present illness. She was evaluated in the context of the global COVID-19 pandemic, which necessitated consideration that the patient might be at risk for infection with the SARS-CoV-2 virus that causes COVID-19. Institutional protocols and algorithms that pertain to the evaluation of patients at risk for COVID-19 are in a state of rapid change based on information released by regulatory bodies including the CDC and federal and state organizations. These policies and algorithms were followed during the patient's care in the ED.  Some ED evaluations and interventions may be delayed as a result of limited staffing during and after the pandemic.*  Note:  This document was prepared using Dragon  voice recognition software and may include unintentional dictation errors.   Gregor Hams, MD 11/11/19 [phone]

## 2019-11-11 NOTE — Discharge Instructions (Signed)
Cellulitis, Adult  Cellulitis is a skin infection. The infected area is often warm, red, swollen, and sore. It occurs most often in the arms and lower legs. It is very important to get treated for this condition. What are the causes? This condition is caused by bacteria. The bacteria enter through a break in the skin, such as a cut, burn, insect bite, open sore, or crack. What increases the risk? This condition is more likely to occur in people who:  Have a weak body defense system (immune system).  Have open cuts, burns, bites, or scrapes on the skin.  Are older than 84 years of age.  Have a blood sugar problem (diabetes).  Have a long-lasting (chronic) liver disease (cirrhosis) or kidney disease.  Are very overweight (obese).  Have a skin problem, such as: ? Itchy rash (eczema). ? Slow movement of blood in the veins (venous stasis). ? Fluid buildup below the skin (edema).  Have been treated with high-energy rays (radiation).  Use IV drugs. What are the signs or symptoms? Symptoms of this condition include:  Skin that is: ? Red. ? Streaking. ? Spotting. ? Swollen. ? Sore or painful when you touch it. ? Warm.  A fever.  Chills.  Blisters. How is this diagnosed? This condition is diagnosed based on:  Medical history.  Physical exam.  Blood tests.  Imaging tests. How is this treated? Treatment for this condition may include:  Medicines to treat infections or allergies.  Home care, such as: ? Rest. ? Placing cold or warm cloths (compresses) on the skin.  Hospital care, if the condition is very bad. Follow these instructions at home: Medicines  Take over-the-counter and prescription medicines only as told by your doctor.  If you were prescribed an antibiotic medicine, take it as told by your doctor. Do not stop taking it even if you start to feel better. General instructions   Drink enough fluid to keep your pee (urine) pale yellow.  Do not touch  or rub the infected area.  Raise (elevate) the infected area above the level of your heart while you are sitting or lying down.  Place cold or warm cloths on the area as told by your doctor.  Keep all follow-up visits as told by your doctor. This is important. Contact a doctor if:  You have a fever.  You do not start to get better after 1-2 days of treatment.  Your bone or joint under the infected area starts to hurt after the skin has healed.  Your infection comes back. This can happen in the same area or another area.  You have a swollen bump in the area.  You have new symptoms.  You feel ill and have muscle aches and pains. Get help right away if:  Your symptoms get worse.  You feel very sleepy.  You throw up (vomit) or have watery poop (diarrhea) for a long time.  You see red streaks coming from the area.  Your red area gets larger.  Your red area turns dark in color. These symptoms may represent a serious problem that is an emergency. Do not wait to see if the symptoms will go away. Get medical help right away. Call your local emergency services (911 in the [place] Do not drive yourself to the hospital. Summary  Cellulitis is a skin infection. The area is often warm, red, swollen, and sore.  This condition is treated with medicines, rest, and cold and warm cloths.  Take all medicines only   as told by your doctor.  Tell your doctor if symptoms do not start to get better after 1-2 days of treatment. This information is not intended to replace advice given to you by your health care provider. Make sure you discuss any questions you have with your health care provider. Document Revised: 09/11/2017 Document Reviewed: 09/11/2017 Elsevier Patient Education  2020 [employer]  

## 2019-11-11 NOTE — ED Notes (Signed)
Pt sitting up eating breakfast.

## 2019-11-11 NOTE — H&P (Signed)
History and Physical    [name] MEQ:[id number] DOB: [birth year] DOA: 11/11/2019  PCP: Elby Beck, FNP   Patient coming from: home  I have personally briefly reviewed patient's old medical records in [place]  Chief Complaint: Pain redness and swelling left foot and leg x4 days  HPI: [name] is a 86 y.o. female with medical history significant for A. fib on Coumadin, diastolic heart failure, nonischemic cardiomyopathy with AICD placement, hypothyroidism, hypertension, history of GI bleed, who presents to the emergency room with pain redness and swelling of the left foot that has been increasing over the past few days, now extending up to the knee.  Patient was seen by her PCP on 11/10/2019 and found to have an abscess on the dorsum of the foot which was I&D with drainage of purulent and bloody fluid.  Patient presented to the ER due to continued pain and redness. ED Course: On arrival she was afebrile and with normal vitals.  WBC normal at 5800, creatinine 1.79 which is about her baseline.  Foot x-ray showed soft tissue edema of the foot without evidence of osteomyelitis.  Patient given Rocephin.  Hospitalist consulted for admission.  Review of Systems: As per HPI otherwise all other systems on review of systems negative.    Past Medical History:  Diagnosis Date  . Adjustment disorder with anxiety   . Anginal pain ([place])   . Arthritis    "some in my hands" (11/11/2012)  . Atrial fibrillation ([place])   . Automatic implantable cardioverter-defibrillator in situ   . Blood in stool   . Carotid artery stenosis 09/2007   a. 09/2007: 60-79% bilateral (stable); b. 10/2008: 40-59% R 60-79%   . Chest pain, unspecified   . Chronic airway obstruction, not elsewhere classified   . Chronic bronchitis ([place])    "get it some; not q year" (11/11/2012)  . Chronic diastolic CHF (congestive heart failure) ([place]) 06/04/2013  . Chronic kidney disease, unspecified   . Coronary artery  disease    non-obstructive by 2006 cath  . Frequent UTI    "get them a couple times/yr" (11/11/2012)  . Hemorrhage of rectum and anus   . High cholesterol   . History of blood transfusion 04/2011   "after hip OR" (11/11/2012)  . Hypertension 05/20/2011  . Hypertr obst cardiomyop   . Hypotension, unspecified    cardiac cath 2006..nonobstructive CAD 30-40s lesions.Marland KitchenETT 1/09 nondiagnostic due to poor HR response..Right Renal Cancer 2003  . Long term (current) use of anticoagulants   . Malignant neoplasm of kidney, except pelvis   . Osteoarthritis of right hip   . Other and unspecified coagulation defects   . PONV (postoperative nausea and vomiting)   . Renal cancer ([place]) 06/2001   Right  . Secondary cardiomyopathy, unspecified   . Sinus bradycardia   . Urge incontinence     Past Surgical History:  Procedure Laterality Date  . ABDOMINAL HYSTERECTOMY  1975   for benign causes  . APPENDECTOMY    . BI-VENTRICULAR PACEMAKER UPGRADE  05/04/2010  . BIOPSY  02/28/2019   Procedure: BIOPSY;  Surgeon: Milus Banister, MD;  Location: [facility] ENDOSCOPY;  Service: Endoscopy;;  . CARDIAC CATHETERIZATION  2006  . CARDIOVERSION N/A 02/20/2018   Procedure: CARDIOVERSION;  Surgeon: Minna Merritts, MD;  Location: [facility] ORS;  Service: Cardiovascular;  Laterality: N/A;  . CARDIOVERSION N/A 03/27/2018   Procedure: CARDIOVERSION;  Surgeon: Minna Merritts, MD;  Location: [facility] ORS;  Service: Cardiovascular;  Laterality: N/A;  . CATARACT EXTRACTION W/ INTRAOCULAR LENS  IMPLANT, BILATERAL  01/2006-02-2006  . CHOLECYSTECTOMY N/A 11/11/2012   Procedure: LAPAROSCOPIC CHOLECYSTECTOMY WITH INTRAOPERATIVE CHOLANGIOGRAM;  Surgeon: Imogene Burn. Georgette Dover, MD;  Location: [place];  Service: General;  Laterality: N/A;  . COLONOSCOPY WITH PROPOFOL N/A 02/28/2019   Procedure: COLONOSCOPY WITH PROPOFOL;  Surgeon: Milus Banister, MD;  Location: [facility] ENDOSCOPY;  Service: Endoscopy;  Laterality: N/A;  . EP IMPLANTABLE DEVICE N/A  02/21/2016   Procedure: ICD Generator Changeout;  Surgeon: Deboraha Sprang, MD;  Location: [place] CV LAB;  Service: Cardiovascular;  Laterality: N/A;  . ESOPHAGOGASTRODUODENOSCOPY (EGD) WITH PROPOFOL N/A 02/28/2019   Procedure: ESOPHAGOGASTRODUODENOSCOPY (EGD) WITH PROPOFOL;  Surgeon: Milus Banister, MD;  Location[facility] ENDOSCOPY;  Service: Endoscopy;  Laterality: N/A;  . GIVENS CAPSULE STUDY N/A 03/15/2019   Procedure: GIVENS CAPSULE STUDY;  Surgeon: Thornton Park, MD;  Location: [place];  Service: Gastroenterology;  Laterality: N/A;  . INSERT / REPLACE / REMOVE PACEMAKER  05-01-11   02-28-05-/05-04-10-ICD-MEDTRONIC MAXIMAL DR  . JOINT REPLACEMENT    . LAPAROSCOPIC CHOLECYSTECTOMY  11/11/2012  . LAPAROSCOPIC LYSIS OF ADHESIONS N/A 11/11/2012   Procedure: LAPAROSCOPIC LYSIS OF ADHESIONS;  Surgeon: Imogene Burn. Georgette Dover, MD;  Location: [place];  Service: General;  Laterality: N/A;  . NEPHRECTOMY Right 06/2001    S/P RENAL CELL CANCER  . PRESSURE SENSOR/CARDIOMEMS N/A 02/03/2019   Procedure: PRESSURE SENSOR/CARDIOMEMS;  Surgeon: Larey Dresser, MD;  Location: [place] CV LAB;  Service: Cardiovascular;  Laterality: N/A;  . RIGHT HEART CATH N/A 11/09/2018   Procedure: RIGHT HEART CATH;  Surgeon: Larey Dresser, MD;  Location: [place] CV LAB;  Service: Cardiovascular;  Laterality: N/A;  . RIGHT HEART CATH N/A 03/08/2019   Procedure: RIGHT HEART CATH;  Surgeon: Larey Dresser, MD;  Location: [place] CV LAB;  Service: Cardiovascular;  Laterality: N/A;  . SUBMUCOSAL TATTOO INJECTION  02/28/2019   Procedure: SUBMUCOSAL TATTOO INJECTION;  Surgeon: Milus Banister, MD;  Location: [facility] ENDOSCOPY;  Service: Endoscopy;;  . TOTAL HIP ARTHROPLASTY Right 05/03/2011   Procedure: TOTAL HIP ARTHROPLASTY ANTERIOR APPROACH;  Surgeon: Mcarthur Rossetti;  Location: [facility] ORS;  Service: Orthopedics;  Laterality: Right;  Removal of Cannulated Screws Right Hip, Right Direct Anterior Hip Replacement  .  TOTAL HIP ARTHROPLASTY Left 10/09/2018   Procedure: TOTAL HIP ARTHROPLASTY ANTERIOR APPROACH;  Surgeon: Rod Can, MD;  Location: [place];  Service: Orthopedics;  Laterality: Left;     reports that she quit smoking about 18 years ago. Her smoking use included cigarettes. She has a 20.00 pack-year smoking history. She has never used smokeless tobacco. She reports that she does not drink alcohol and does not use drugs.  Allergies  Allergen Reactions  . Dilaudid [Hydromorphone] Other (See Comments)    Excessive Somnolence- Required Narcan  . Fentanyl Other (See Comments)    Required Narcan when given with Dilaudid. Tolerated single dose when given during procedure 02/03/2019.   Marland Kitchen Codeine Nausea And Vomiting    Hallucinations   . Morphine And Related Nausea And Vomiting    Hallucinations   . Sulfonamide Derivatives Other (See Comments)    Dry mouth    Family History  Problem Relation Age of Onset  . Heart failure Mother   . Breast cancer Maternal Aunt 82  . Breast cancer Cousin   . Breast cancer Other   . Colon cancer Neg Hx       Prior to Admission medications  Medication Sig Start Date End Date Taking? Authorizing Provider  acetaminophen (TYLENOL) 500 MG tablet Take 500-1,000 mg by mouth every 8 (eight) hours as needed for mild pain or headache.     [provider]  bisacodyl (DULCOLAX) 5 MG EC tablet Take 1 tablet (5 mg total) by mouth daily as needed for moderate constipation. 10/12/18   Aline August, MD  cetirizine (ZYRTEC) 10 MG tablet Take 10 mg by mouth daily.     [provider]  Cholecalciferol (VITAMIN D) 50 MCG (2000 UT) CAPS Take 4,000 Units by mouth daily.    [provider]  conjugated estrogens (PREMARIN) vaginal cream Place 1 Applicatorful vaginally daily. 07/31/18   Elby Beck, FNP  Cranberry (AZO CRANBERRY GUMMIES) 500 MG CHEW Chew 2,000 mg by mouth daily.    [provider]  ezetimibe (ZETIA) 10 MG tablet Take 1 tablet  (10 mg total) by mouth daily. 08/18/17   Minna Merritts, MD  ferrous sulfate 325 (65 FE) MG tablet Take 1 tablet (325 mg total) by mouth 2 (two) times daily with a meal. 03/17/19   Shelly Coss, MD  fluticasone (FLONASE) 50 MCG/ACT nasal spray Place 2 sprays into both nostrils daily as needed for allergies or rhinitis. 07/27/19   Elby Beck, FNP  levothyroxine (SYNTHROID) 25 MCG tablet TAKE 1 TABLET BY MOUTH ONCE DAILY BEFOREBREAKFAST 04/19/19   Deboraha Sprang, MD  magnesium oxide (MAG-OX) 400 MG tablet Take 1 tablet (400 mg total) by mouth daily. 06/05/18   Deboraha Sprang, MD  metolazone (ZAROXOLYN) 5 MG tablet Take 1 tablet (5 mg total) by mouth 2 (two) times a week. Tuesday and Saturday 11/04/19   Darrick Grinder D, NP  potassium chloride SA (KLOR-CON) 20 MEQ tablet Take 40 mEq by mouth 2 (two) times daily. 1 extra tab on metolazone days    [provider]  rosuvastatin (CRESTOR) 40 MG tablet Take 1 tablet (40 mg total) by mouth daily. 11/11/18   Larey Dresser, MD  torsemide (DEMADEX) 100 MG tablet Take 1 tablet (100 mg total) by mouth 2 (two) times daily. 06/14/19   Clegg, Amy D, NP  vitamin C (ASCORBIC ACID) 250 MG tablet Take 500 mg by mouth daily.    [provider]  warfarin (COUMADIN) 2 MG tablet TAKE 1 TABLETS BY MOUTH DAILY AT 6PM AS DIRECTED BY ANTICOAGULATION CLINIC. 09/27/19   Elby Beck, FNP    Physical Exam: Vitals:   11/10/19 1938 11/10/19 1939 11/10/19 1953 11/10/19 2215  BP: (!) 127/52  138/68 (!) 155/45  Pulse: 60  61 (!) 59  Resp: 18  17 18   Temp: 98.1 F (36.7 C)  98.9 F (37.2 C)   TempSrc: Oral  Oral   SpO2: 96%  99% 99%  Weight:  67.2 kg    Height:  5' 6.5" (1.689 m)       Vitals:   11/10/19 1938 11/10/19 1939 11/10/19 1953 11/10/19 2215  BP: (!) 127/52  138/68 (!) 155/45  Pulse: 60  61 (!) 59  Resp: 18  17 18   Temp: 98.1 F (36.7 C)  98.9 F (37.2 C)   TempSrc: Oral  Oral   SpO2: 96%  99% 99%  Weight:  67.2 kg    Height:   5' 6.5" (1.689 m)        Constitutional: Alert and oriented x 3 . Not in any apparent distress HEENT:      Head: Normocephalic and atraumatic.  Eyes: PERLA, EOMI, Conjunctivae are normal. Sclera is non-icteric.       Mouth/Throat: Mucous membranes are moist.       Neck: Supple with no signs of meningismus. Cardiovascular:  Irregularly irregular. No murmurs, gallops, or rubs. 2+ symmetrical distal pulses are present . No JVD. No LE edema Respiratory: Respiratory effort normal .Lungs sounds clear bilaterally. No wheezes, crackles, or rhonchi.  Gastrointestinal: Soft, non tender, and non distended with positive bowel sounds. No rebound or guarding. Genitourinary: No CVA tenderness. Musculoskeletal: Nontender with normal range of motion in all extremities. No cyanosis.  Erythema and mild edema of dorsum of left foot extending up anterolateral leg to just below the knee. Neurologic: Normal speech and language. Face is symmetric. Moving all extremities. No gross focal neurologic deficits . Skin: Skin is warm, dry.  No rash or ulcers. Erythema and mild edema of dorsum of left foot extending up anterolateral leg to just below the knee.  Small ulcer dorsal lateral aspect of foot from recent I&D.  No fluctuance Psychiatric: Mood and affect are normal Speech and behavior are normal   Labs on Admission: I have personally reviewed following labs and imaging studies  CBC: Recent Labs  Lab 11/10/19 1954  WBC 5.8  NEUTROABS 3.8  HGB 10.4*  HCT 32.0*  MCV 82.5  PLT 542   Basic Metabolic Panel: Recent Labs  Lab 11/10/19 1954  NA 139  K 3.8  CL 92*  CO2 33*  GLUCOSE 92  BUN 58*  CREATININE 1.79*  CALCIUM 9.1   GFR: Estimated Creatinine Clearance: 22.7 mL/min (A) (by C-G formula based on SCr of 1.79 mg/dL (H)). Liver Function Tests: Recent Labs  Lab 11/10/19 1954  AST 20  ALT 12  ALKPHOS 58  BILITOT 0.9  PROT 7.1  ALBUMIN 4.2   No results for input(s): LIPASE, AMYLASE  in the last 168 hours. No results for input(s): AMMONIA in the last 168 hours. Coagulation Profile: No results for input(s): INR, PROTIME in the last 168 hours. Cardiac Enzymes: No results for input(s): CKTOTAL, CKMB, CKMBINDEX, TROPONINI in the last 168 hours. BNP (last 3 results) Recent Labs    05/06/19 1201  PROBNP 431.0*   HbA1C: No results for input(s): HGBA1C in the last 72 hours. CBG: No results for input(s): GLUCAP in the last 168 hours. Lipid Profile: No results for input(s): CHOL, HDL, LDLCALC, TRIG, CHOLHDL, LDLDIRECT in the last 72 hours. Thyroid Function Tests: No results for input(s): TSH, T4TOTAL, FREET4, T3FREE, THYROIDAB in the last 72 hours. Anemia Panel: No results for input(s): VITAMINB12, FOLATE, FERRITIN, TIBC, IRON, RETICCTPCT in the last 72 hours. Urine analysis:    Component Value Date/Time   COLORURINE STRAW (A) 02/26/2019 2030   APPEARANCEUR CLEAR 02/26/2019 2030   APPEARANCEUR Cloudy (A) 01/27/2018 1330   LABSPEC 1.006 02/26/2019 2030   PHURINE 6.0 02/26/2019 2030   GLUCOSEU NEGATIVE 02/26/2019 2030   [place] NEGATIVE 02/26/2019 2030   HGBUR large 11/16/2009 1202   BILIRUBINUR ngative 09/15/2019 1528   BILIRUBINUR Negative 01/27/2018 1330   [place] 02/26/2019 2030   PROTEINUR Negative 09/15/2019 1528   PROTEINUR NEGATIVE 02/26/2019 2030   UROBILINOGEN 0.2 09/15/2019 1528   UROBILINOGEN 1.0 08/16/2014 1754   NITRITE negative 09/15/2019 1528   NITRITE NEGATIVE 02/26/2019 2030   LEUKOCYTESUR Negative 09/15/2019 1528   LEUKOCYTESUR TRACE (A) 02/26/2019 2030    Radiological Exams on Admission: DG Foot Complete Left  Result Date: 11/10/2019 CLINICAL DATA:  Left foot abscess with diffuse erythema. EXAM:  LEFT FOOT - COMPLETE 3+ VIEW COMPARISON:  None. FINDINGS: There is no evidence of fracture or dislocation. There is no evidence of arthropathy or other focal bone abnormality. Soft tissues are edematous. No osteolysis. IMPRESSION: No  radiographic evidence of osteomyelitis. Soft tissue edema of the left foot. Electronically Signed   By: Ulyses Jarred M.D.   On: 11/10/2019 20:24    EKG: Independently reviewed. Interpretation :   Assessment/Plan 86 year old female with history of CHF, HTN, CKD 3, A. fib on Coumadin, presenting with cellulitis left foot, with history of recent I&D of small abscess by PCP earlier on the day of arrival    Cellulitis of left leg -Patient is status post I&D of small abscess dorsum left foot and later in the day of arrival -Continue IV Rocephin started in the emergency room -Keep leg elevated  Chronic diastolic heart failure, with history of systolic heart failure secondary to nonischemic cardiomyopathy status post AICD -Stable.  Not exacerbated -Continue home torsemide pending med rec.  No beta-blocker or ACE none current med list pending med rec    Permanent atrial fibrillation on chronic anticoagulation with Coumadin -Patient has failed multiple antiarrhythmics and ablation in the past and follows with cardiology -Pharmacy to manage Coumadin  Hypothyroidism -Continue Synthroid pending med rec    CKD (chronic kidney disease) stage 3, GFR 30-59 ml/min in the setting of history of nephrectomy secondary to [place] -Renal function at baseline    History of GI bleed -No acute concerns at this time    DVT prophylaxis: Warfarin Code Status: full code.  Discussed with patient.  Was previously DNR Family Communication:  none  Disposition Plan: Back to previous home environment Consults called: none  Status: Observation    Athena Masse MD [facility]     11/11/2019, 2:33 AM

## 2019-11-15 ENCOUNTER — Other Ambulatory Visit: Payer: Self-pay

## 2019-11-15 ENCOUNTER — Ambulatory Visit (INDEPENDENT_AMBULATORY_CARE_PROVIDER_SITE_OTHER): Payer: PPO | Admitting: Family Medicine

## 2019-11-15 ENCOUNTER — Encounter: Payer: Self-pay | Admitting: Family Medicine

## 2019-11-15 ENCOUNTER — Ambulatory Visit: Payer: Self-pay

## 2019-11-15 VITALS — BP 158/62 | HR 60 | Temp 97.6°F | Ht 66.5 in | Wt 149.0 lb

## 2019-11-15 DIAGNOSIS — Z7901 Long term (current) use of anticoagulants: Secondary | ICD-10-CM

## 2019-11-15 DIAGNOSIS — L03116 Cellulitis of left lower limb: Secondary | ICD-10-CM

## 2019-11-15 DIAGNOSIS — N1832 Chronic kidney disease, stage 3b: Secondary | ICD-10-CM

## 2019-11-15 LAB — CBC WITH DIFFERENTIAL/PLATELET
Basophils Absolute: 0 10*3/uL (ref 0.0–0.1)
Basophils Relative: 0.6 % (ref 0.0–3.0)
Eosinophils Absolute: 0 10*3/uL (ref 0.0–0.7)
Eosinophils Relative: 1.1 % (ref 0.0–5.0)
HCT: 32.4 % — ABNORMAL LOW (ref 36.0–46.0)
Hemoglobin: 10.6 g/dL — ABNORMAL LOW (ref 12.0–15.0)
Lymphocytes Relative: 20.2 % (ref 12.0–46.0)
Lymphs Abs: 0.9 10*3/uL (ref 0.7–4.0)
MCHC: 32.7 g/dL (ref 30.0–36.0)
MCV: 82.9 fl (ref 78.0–100.0)
Monocytes Absolute: 0.7 10*3/uL (ref 0.1–1.0)
Monocytes Relative: 15 % — ABNORMAL HIGH (ref 3.0–12.0)
Neutro Abs: 2.8 10*3/uL (ref 1.4–7.7)
Neutrophils Relative %: 63.1 % (ref 43.0–77.0)
Platelets: 208 10*3/uL (ref 150.0–400.0)
RBC: 3.91 Mil/uL (ref 3.87–5.11)
RDW: 20.8 % — ABNORMAL HIGH (ref 11.5–15.5)
WBC: 4.4 10*3/uL (ref 4.0–10.5)

## 2019-11-15 LAB — BASIC METABOLIC PANEL
BUN: 49 mg/dL — ABNORMAL HIGH (ref 6–23)
CO2: 36 mEq/L — ABNORMAL HIGH (ref 19–32)
Calcium: 9.6 mg/dL (ref 8.4–10.5)
Chloride: 91 mEq/L — ABNORMAL LOW (ref 96–112)
Creatinine, Ser: 1.45 mg/dL — ABNORMAL HIGH (ref 0.40–1.20)
GFR: 34.43 mL/min — ABNORMAL LOW (ref >60.00)
Glucose, Bld: 78 mg/dL (ref 70–99)
Potassium: 3.6 mEq/L (ref 3.5–5.1)
Sodium: 136 mEq/L (ref 135–145)

## 2019-11-15 LAB — POCT INR: INR: 1.6 — AB (ref 2.0–3.0)

## 2019-11-15 LAB — PROTIME-INR
INR: 1.6 ratio — ABNORMAL HIGH (ref 0.8–1.0)
Prothrombin Time: 17.7 s — ABNORMAL HIGH (ref 9.6–13.1)

## 2019-11-15 NOTE — Progress Notes (Signed)
Subjective:    Patient ID: [name], female    DOB: Nov 02, 1933, 86 y.o.   MRN: [id number]  HPI Chief Complaint  Patient presents with  . Dizziness    Little dizziness this morning. Recheck INR. New Cellulitis infection, seen in ED, Taking Doxycycline 100mg  (3 days left)   This is an 86 yo female who presents today for follow up of new cellulitis of left leg and chronic medical conditions.   Left leg cellulitis- was seen at ER 11/11/19, was started on doxycycline, has 3 days remaining. Has noticed decreased redness, no recent drainage. No fever/ chills. Some improvement of swelling. Has chronic LE edema/ CHF. Unable to wear compression sock on left leg due to wound. Has upcoming appointment with Dr. Christena Flake.   CHF- followed closely by CHF clinic. Recently had diuretic dose decreased due to elevated BUN, low cardiac mims reading.   Dizziness- improved with most recent iron infusions. Still has occasionally. On severe fluid restriction and has been running a little dry per labs.     Review of Systems Per HPI    Objective:   Physical Exam Vitals reviewed.  Constitutional:      General: She is not in acute distress.    Appearance: Normal appearance. She is normal weight. She is ill-appearing (chronically). She is not toxic-appearing or diaphoretic.  HENT:     Head: Normocephalic and atraumatic.  Eyes:     Conjunctiva/sclera: Conjunctivae normal.  Cardiovascular:     Rate and Rhythm: Normal rate. Rhythm irregular.     Heart sounds: Normal heart sounds.  Pulmonary:     Effort: Pulmonary effort is normal.     Breath sounds: Normal breath sounds.  Musculoskeletal:     Cervical back: Normal range of motion and neck supple.     Comments: Ambulating without assistive devices.   Skin:    General: Skin is warm and dry.     Findings: Lesion (see clincial image. Raised area with thick scab. No surrounding erythema, no drainae or fluctuance, tender to palpation.) present.    Neurological:     Mental Status: She is alert and oriented to person, place, and time.  Psychiatric:        Mood and Affect: Mood normal.        Behavior: Behavior normal.        Thought Content: Thought content normal.        Judgment: Judgment normal.       BP (!) 158/62 (BP Location: Left Arm, Patient Position: Sitting, Cuff Size: Normal)   Pulse 60   Temp 97.6 F (36.4 C) (Temporal)   Ht 5' 6.5" (1.689 m)   Wt 149 lb (67.6 kg)   SpO2 97%   BMI 23.69 kg/m  Wt Readings from Last 3 Encounters:  11/15/19 149 lb (67.6 kg)  11/10/19 148 lb 3.2 oz (67.2 kg)  11/10/19 145 lb 9.6 oz (66 kg)   BP Readings from Last 3 Encounters:  11/15/19 (!) 158/62  11/11/19 (!) 142/89  11/02/19 (!) 118/58         Assessment & Plan:  1. Stage 3b chronic kidney disease - Basic metabolic panel  2. Long term (current) use of anticoagulants - CBC with Differential - Protime-INR  3. Cellulitis of left leg - improved, finish antibiotic, elevate leg when possible, follow up with Dr. Sharol Given as scheduled - RTC precautions reveiwed - when healed, resume wearing compression sock   Clarene Reamer, FNP-BC  [facility]  [place] [place] [facility]  11/18/2019 8:30 AM

## 2019-11-15 NOTE — Patient Instructions (Addendum)
Pre visit review using our clinic review tool, if applicable. No additional management support is needed unless otherwise documented below in the visit note.  Increase dose today to take 4mg  then change weekly dose to take 3mg  daily except take 2mg  on Wednesdays. Recheck in 2 wks.  Advised pt on the phone. Pt wrote instructions down and verbalized understanding.

## 2019-11-15 NOTE — Patient Instructions (Addendum)
Good to see you today  I will notify you of your results  Moisturize your legs at least 2x a day  Finish antibiotic and let me know if your leg gets any worse  Follow up in 3 months

## 2019-11-18 ENCOUNTER — Telehealth: Payer: Self-pay

## 2019-11-18 NOTE — Telephone Encounter (Signed)
Please call patient and let her know that I think she had a long enough antibiotic course. It will take awhile for redness and swelling to go down. Continue to elevated, clean gently with soap and water and notify office if any increased redness/ swelling, fever or drainage occur.

## 2019-11-18 NOTE — Telephone Encounter (Signed)
Noted  

## 2019-11-18 NOTE — Telephone Encounter (Signed)
Patient states she was treated with a round of doxycycline for her cellulitis. She just finished these abx and states that the cellulitis on her leg is not any worse, but she states that she still is having some redness and mild swelling. She wants to know if she needs another round of abx, or if Jackelyn Poling has any other recommendations?

## 2019-11-18 NOTE — Telephone Encounter (Signed)
Pt aware of recommendations - pt states that she will keep [place] posted.  Pt advised that she can always call tomorrow and give an update before the weekend.  Pt states that she will let us know how she is doing.   Nothing further needed.

## 2019-11-27 ENCOUNTER — Other Ambulatory Visit (HOSPITAL_COMMUNITY): Payer: Self-pay | Admitting: Cardiology

## 2019-11-29 ENCOUNTER — Telehealth (HOSPITAL_COMMUNITY): Payer: Self-pay | Admitting: *Deleted

## 2019-11-29 NOTE — Telephone Encounter (Signed)
Pt left VM stating she has been weak and lethargic for the last few days. She feels like her fluid is up. Pt is due to take metolazone tomorrow but took it today in hopes it would make her feel better. Called pt to see if metolazone has helped and get more information I receive a busy tone when I dial the home number and no answer on the mobile number but was able to leave a message on mobile #.

## 2019-11-30 ENCOUNTER — Ambulatory Visit (INDEPENDENT_AMBULATORY_CARE_PROVIDER_SITE_OTHER): Payer: PPO

## 2019-11-30 ENCOUNTER — Other Ambulatory Visit: Payer: Self-pay

## 2019-11-30 ENCOUNTER — Other Ambulatory Visit (HOSPITAL_COMMUNITY): Payer: Self-pay | Admitting: *Deleted

## 2019-11-30 DIAGNOSIS — Z7901 Long term (current) use of anticoagulants: Secondary | ICD-10-CM | POA: Diagnosis not present

## 2019-11-30 LAB — POCT INR: INR: 2.5 (ref 2.0–3.0)

## 2019-11-30 MED ORDER — POTASSIUM CHLORIDE CRYS ER 20 MEQ PO TBCR: 40.0000 meq | EXTENDED_RELEASE_TABLET | Freq: Two times a day (BID) | ORAL | 3 refills | Status: DC

## 2019-11-30 NOTE — Patient Instructions (Addendum)
Pre visit review using our clinic review tool, if applicable. No additional management support is needed unless otherwise documented below in the visit note.  Continue 3mg  daily except take 2mg  on Wednesdays. Recheck in 3 wks.

## 2019-12-09 ENCOUNTER — Other Ambulatory Visit (HOSPITAL_COMMUNITY): Payer: Self-pay | Admitting: Cardiology

## 2019-12-10 ENCOUNTER — Ambulatory Visit: Payer: PPO | Admitting: Family Medicine

## 2019-12-13 ENCOUNTER — Telehealth (HOSPITAL_COMMUNITY): Payer: Self-pay | Admitting: Adult Health

## 2019-12-13 MED ORDER — TORSEMIDE 100 MG PO TABS: 100.0000 mg | ORAL_TABLET | Freq: Two times a day (BID) | ORAL | 3 refills | Status: DC

## 2019-12-13 NOTE — Telephone Encounter (Signed)
Done

## 2019-12-13 NOTE — Telephone Encounter (Signed)
Patient request Torsemide refill, please send script to [place]

## 2019-12-14 ENCOUNTER — Other Ambulatory Visit (HOSPITAL_COMMUNITY): Payer: Self-pay | Admitting: *Deleted

## 2019-12-15 ENCOUNTER — Encounter: Payer: Self-pay | Admitting: Family Medicine

## 2019-12-15 ENCOUNTER — Ambulatory Visit (INDEPENDENT_AMBULATORY_CARE_PROVIDER_SITE_OTHER): Payer: PPO | Admitting: Family Medicine

## 2019-12-15 ENCOUNTER — Other Ambulatory Visit: Payer: Self-pay

## 2019-12-15 VITALS — BP 126/82 | HR 62 | Temp 97.6°F | Ht 66.5 in | Wt 156.0 lb

## 2019-12-15 DIAGNOSIS — N183 Chronic kidney disease, stage 3 unspecified: Secondary | ICD-10-CM | POA: Diagnosis not present

## 2019-12-15 DIAGNOSIS — Z7901 Long term (current) use of anticoagulants: Secondary | ICD-10-CM | POA: Diagnosis not present

## 2019-12-15 DIAGNOSIS — M542 Cervicalgia: Secondary | ICD-10-CM | POA: Diagnosis not present

## 2019-12-15 DIAGNOSIS — R300 Dysuria: Secondary | ICD-10-CM

## 2019-12-15 DIAGNOSIS — I5032 Chronic diastolic (congestive) heart failure: Secondary | ICD-10-CM | POA: Diagnosis not present

## 2019-12-15 DIAGNOSIS — R310 Gross hematuria: Secondary | ICD-10-CM | POA: Diagnosis not present

## 2019-12-15 DIAGNOSIS — D509 Iron deficiency anemia, unspecified: Secondary | ICD-10-CM

## 2019-12-15 DIAGNOSIS — R5383 Other fatigue: Secondary | ICD-10-CM

## 2019-12-15 LAB — POC URINALSYSI DIPSTICK (AUTOMATED)
Bilirubin, UA: NEGATIVE
Blood, UA: 1
Glucose, UA: NEGATIVE
Ketones, UA: NEGATIVE
Leukocytes, UA: NEGATIVE
Nitrite, UA: NEGATIVE
Protein, UA: NEGATIVE
Spec Grav, UA: 1.01 (ref 1.010–1.025)
Urobilinogen, UA: 0.2 E.U./dL
pH, UA: 6 (ref 5.0–8.0)

## 2019-12-15 LAB — BASIC METABOLIC PANEL
BUN: 38 mg/dL — ABNORMAL HIGH (ref 6–23)
CO2: 32 mEq/L (ref 19–32)
Calcium: 9.2 mg/dL (ref 8.4–10.5)
Chloride: 93 mEq/L — ABNORMAL LOW (ref 96–112)
Creatinine, Ser: 1.47 mg/dL — ABNORMAL HIGH (ref 0.40–1.20)
GFR: 33.88 mL/min — ABNORMAL LOW (ref >60.00)
Glucose, Bld: 99 mg/dL (ref 70–99)
Potassium: 3.8 mEq/L (ref 3.5–5.1)
Sodium: 135 mEq/L (ref 135–145)

## 2019-12-15 LAB — CBC WITH DIFFERENTIAL/PLATELET
Basophils Absolute: 0 10*3/uL (ref 0.0–0.1)
Basophils Relative: 0.5 % (ref 0.0–3.0)
Eosinophils Absolute: 0 10*3/uL (ref 0.0–0.7)
Eosinophils Relative: 0.6 % (ref 0.0–5.0)
HCT: 27.6 % — ABNORMAL LOW (ref 36.0–46.0)
Hemoglobin: 8.9 g/dL — ABNORMAL LOW (ref 12.0–15.0)
Lymphocytes Relative: 16.8 % (ref 12.0–46.0)
Lymphs Abs: 0.7 10*3/uL (ref 0.7–4.0)
MCHC: 32.1 g/dL (ref 30.0–36.0)
MCV: 82.8 fl (ref 78.0–100.0)
Monocytes Absolute: 0.6 10*3/uL (ref 0.1–1.0)
Monocytes Relative: 14.9 % — ABNORMAL HIGH (ref 3.0–12.0)
Neutro Abs: 2.7 10*3/uL (ref 1.4–7.7)
Neutrophils Relative %: 67.2 % (ref 43.0–77.0)
Platelets: 225 10*3/uL (ref 150.0–400.0)
RBC: 3.33 Mil/uL — ABNORMAL LOW (ref 3.87–5.11)
RDW: 18.8 % — ABNORMAL HIGH (ref 11.5–15.5)
WBC: 4 10*3/uL (ref 4.0–10.5)

## 2019-12-15 LAB — PROTIME-INR
INR: 3 ratio — ABNORMAL HIGH (ref 0.8–1.0)
Prothrombin Time: 33.1 s — ABNORMAL HIGH (ref 9.6–13.1)

## 2019-12-15 LAB — POCT INR: INR: 3 (ref 2.0–3.0)

## 2019-12-15 NOTE — Progress Notes (Signed)
Subjective:    Patient ID: [name], female    DOB: 1933/05/27, 86 y.o.   MRN: [id number]  HPI Chief Complaint  Patient presents with  . Fatigue    fatigue, weakness, blood in urine-causing burning sensation,--2 weeks. Denied fever  This is an 86 year old female with multiple, complex medical problems who presents today with several concerns.  Fatigue/weakness-she reports being more fatigued lately, does not have any energy, could fall asleep easily.  This has been going on for at least a month.  She has a history of iron deficiency anemia and has received several Feraheme infusions over the last several months.  Wound of left lower leg-she feels that this is getting better, she still has a large scab there but less redness on the lower leg overall.  She is anxious for the scab to fall off so that she can resume wearing her compression socks.  She has been sleeping in a recliner due to pain of the left leg.  Pain has improved but she has gotten out of the habit of sleeping in her bed.  CHF-weight is up a 5 to 6 pounds per her scale at home.  Cardiology changed her metolazone  to twice a week from 3 times a week.  Hematuria-has noticed for about 1 month.  Some intermittent dysuria and lower abdominal pressure.  Has a history of recurrent UTIs.  Left-sided neck pain-awoke one morning with this, feels like a "knot," on the side.  Some discomfort with moving neck.  Some radiation up and down.  Review of Systems Per HPI    Objective:   Physical Exam Vitals reviewed.  Constitutional:      General: She is not in acute distress.    Appearance: She is normal weight. She is ill-appearing (chronically). She is not toxic-appearing or diaphoretic.  HENT:     Head: Normocephalic and atraumatic.     Right Ear: Tympanic membrane, ear canal and external ear normal.     Left Ear: External ear normal. There is impacted cerumen.  Cardiovascular:     Rate and Rhythm: Normal rate. Rhythm  irregular.     Heart sounds: Murmur heard.  No friction rub. No gallop.   Pulmonary:     Effort: Pulmonary effort is normal.     Breath sounds: Normal breath sounds.  Musculoskeletal:     Cervical back: Normal range of motion and neck supple. Tenderness (left side, no abnormality palpated) present. No rigidity.     Right lower leg: Edema (+1) present.     Left lower leg: Edema (+1) present.  Lymphadenopathy:     Cervical: No cervical adenopathy.  Skin:    General: Skin is warm and dry.     Findings: Lesion (see clinical image) present.  Neurological:     Mental Status: She is alert and oriented to person, place, and time.  Psychiatric:        Mood and Affect: Mood normal.        Behavior: Behavior normal.        Thought Content: Thought content normal.        Judgment: Judgment normal.   First image is from 12/15/2019  Second image from 11/15/2019      BP 126/82   Pulse 62   Temp 97.6 F (36.4 C)   Ht 5' 6.5" (1.689 m)   Wt 156 lb (70.8 kg)   SpO2 98%   BMI 24.80 kg/m  Wt Readings from Last 3 Encounters:  12/15/19 156 lb (70.8 kg)  11/15/19 149 lb (67.6 kg)  11/10/19 148 lb 3.2 oz (67.2 kg)       Assessment & Plan:  1. Stage 3 chronic kidney disease, unspecified whether stage 3a or 3b CKD - CBC with Differential - Basic metabolic panel - Ambulatory referral to Hematology  2. Long term (current) use of anticoagulants - CBC with Differential - Basic metabolic panel - Protime-INR - Ambulatory referral to Hematology  3. Fatigue, unspecified type - CBC with Differential - Basic metabolic panel  4. Dysuria -Urinalysis shows +1 blood, no nitrites/leukocytes, will send for culture given history - POCT Urinalysis Dipstick (Automated) - Urine Culture  5. Iron deficiency anemia, unspecified iron deficiency anemia type -CBC from today shows decreased hematocrit/hemoglobin, patient has been referred to hematology in the fall but was not able to see them due to  breaking her hip and ending up in the hospital.  Previous GI work-up negative. - Ambulatory referral to Hematology  6. Gross hematuria - per number 5  7. Neck pain without injury -Suspect some strain related to sleeping in recliner.  Discussed treating with acetaminophen, heat, gentle range of motion  8. Chronic diastolic CHF (congestive heart failure) (HCC) -Weight up a little bit.  We will get in touch with CHF clinic NP to check on status of Card[facility] numbers.  Over 45 minutes were spent face-to-face with the patient during this encounter and >50% of that time was spent on counseling and coordination of care  This visit occurred during the SARS-CoV-2 public health emergency.  Safety protocols were in place, including screening questions prior to the visit, additional usage of staff PPE, and extensive cleaning of exam room while observing appropriate contact time as indicated for disinfecting solutions.      Clarene Reamer, FNP-BC  [facility]  12/15/2019 6:18 PM

## 2019-12-16 ENCOUNTER — Ambulatory Visit (INDEPENDENT_AMBULATORY_CARE_PROVIDER_SITE_OTHER): Payer: PPO

## 2019-12-16 DIAGNOSIS — Z7901 Long term (current) use of anticoagulants: Secondary | ICD-10-CM

## 2019-12-16 LAB — URINE CULTURE
MICRO NUMBER:: 10814084
SPECIMEN QUALITY:: ADEQUATE

## 2019-12-16 NOTE — Patient Instructions (Addendum)
Pre visit review using our clinic review tool, if applicable. No additional management support is needed unless otherwise documented below in the visit note.  Continue 3mg  daily except take 2mg  on Wednesdays. Recheck in 4 wks.

## 2019-12-17 NOTE — Progress Notes (Signed)
Noted  

## 2019-12-20 NOTE — Telephone Encounter (Signed)
Encounter open in error 

## 2019-12-22 IMAGING — CT CT RENAL STONE PROTOCOL
2 of 4 series · 16 of 46 positions shown, 18 images · non-contrast
Comparison: July 22, 2005.

CLINICAL DATA: Flank pain.

EXAM:
CT ABDOMEN AND PELVIS WITHOUT CONTRAST
TECHNIQUE: Multidetector CT imaging of the abdomen and pelvis was performed
following the standard protocol without IV contrast.

[Series 3: renal stone 5.0 · axial · 0.81mm/px · z∈[+829,+1239]mm · 13 of 90 slices shown, 15 images]
[im 4/90  soft-tissue]
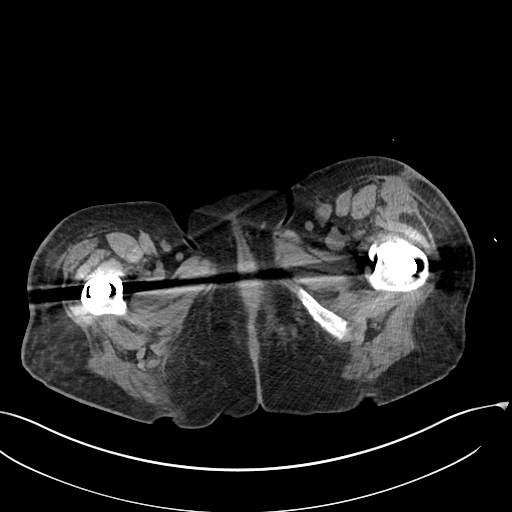
[im 4/90  bone]
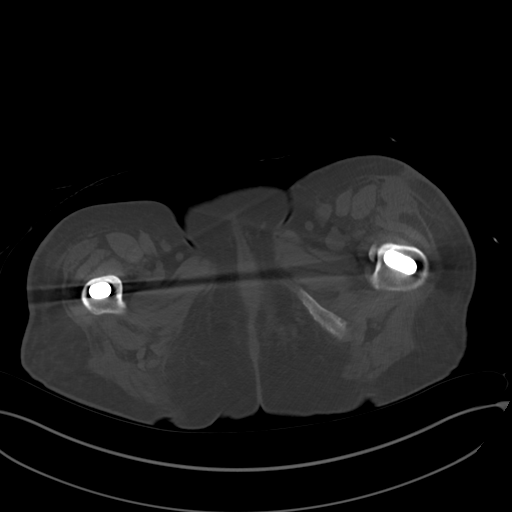
[im 12/90  soft-tissue]
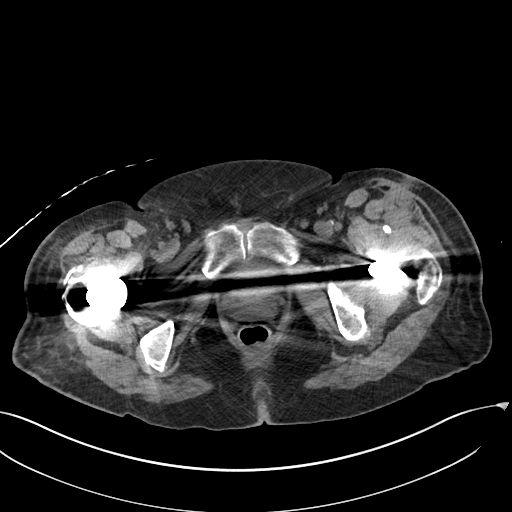
[im 19/90  soft-tissue]
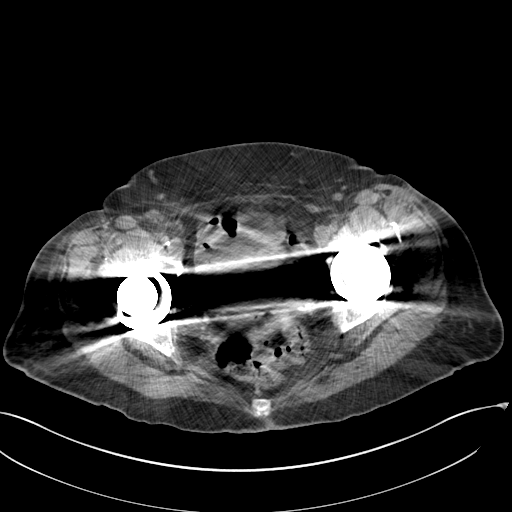
[im 26/90  soft-tissue]
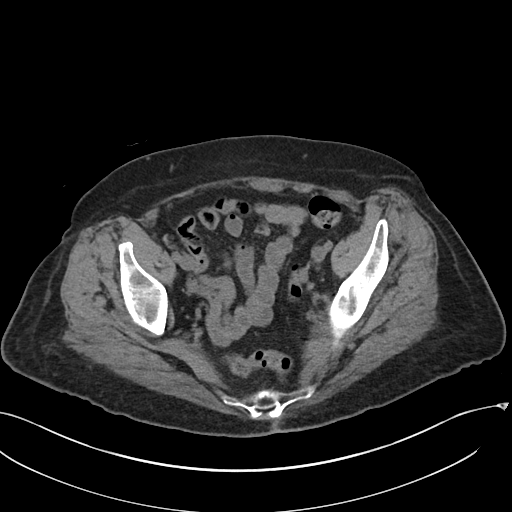
[im 30/90  soft-tissue]
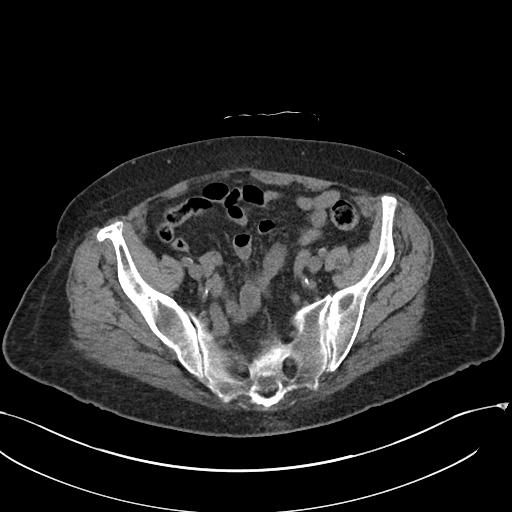
[im 38/90  soft-tissue]
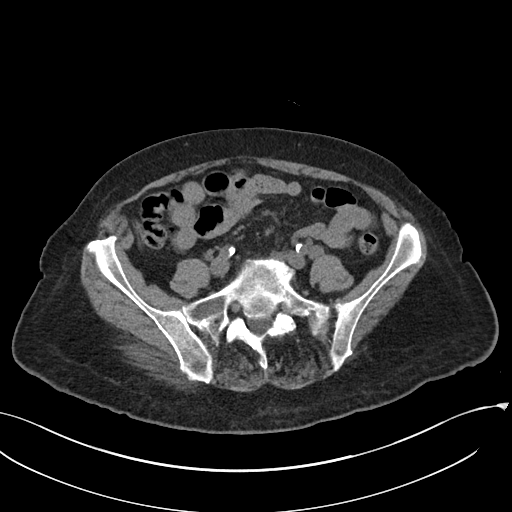
[im 45/90  soft-tissue]
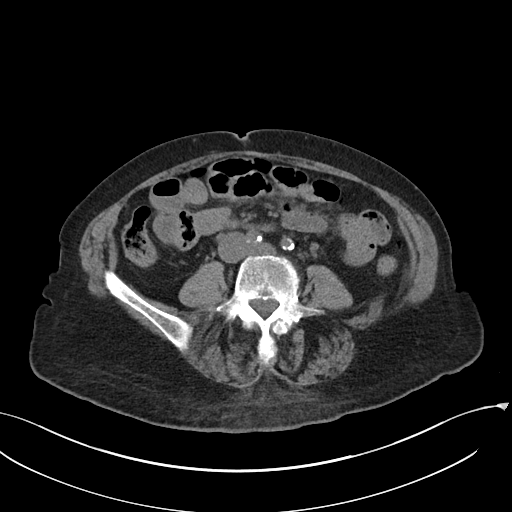
[im 52/90  soft-tissue]
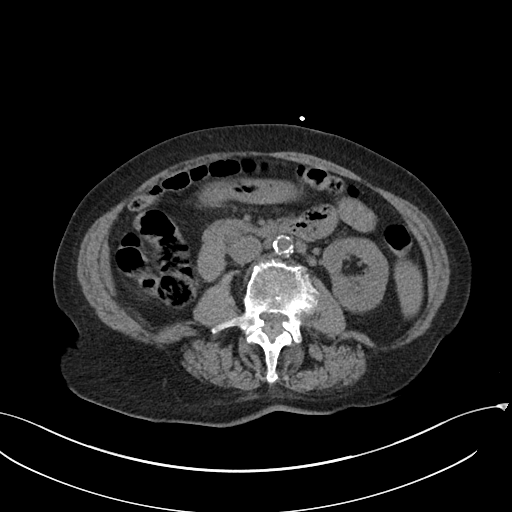
[im 60/90  soft-tissue]
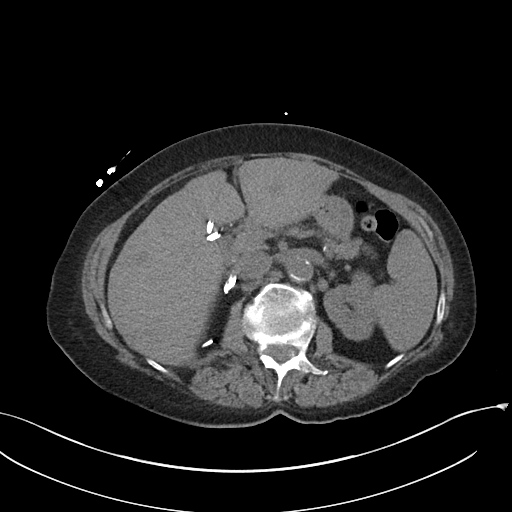
[im 60/90  bone]
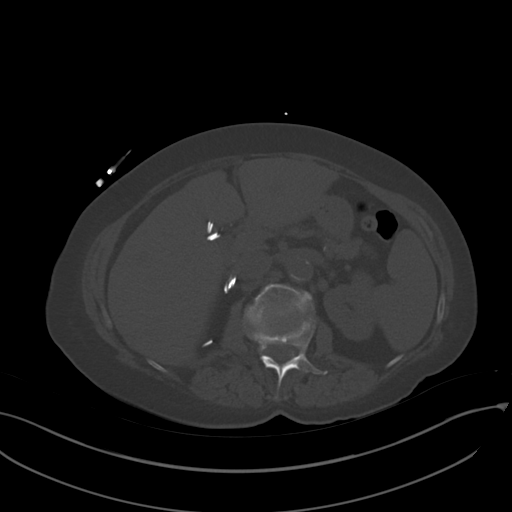
[im 64/90  soft-tissue]
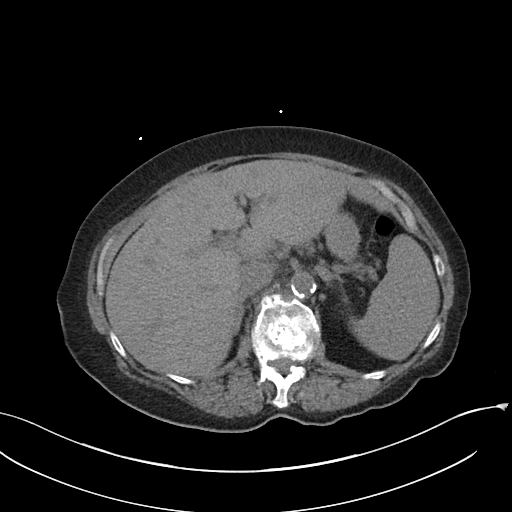
[im 71/90  soft-tissue]
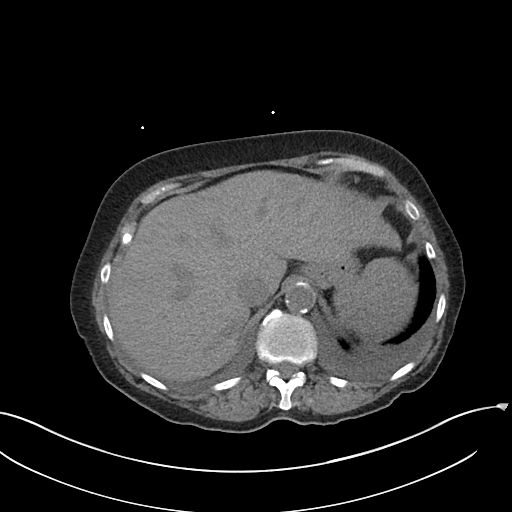
[im 78/90  soft-tissue]
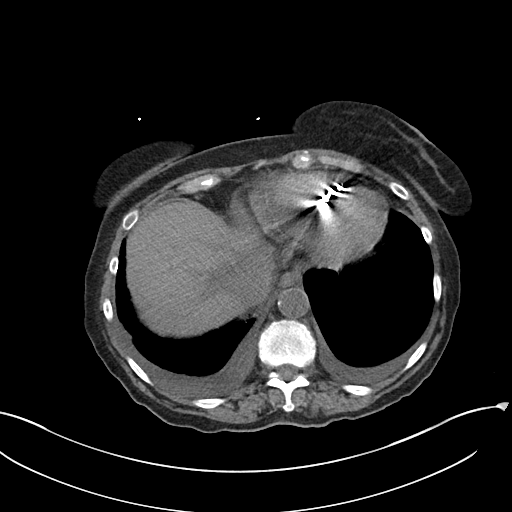
[im 86/90  soft-tissue]
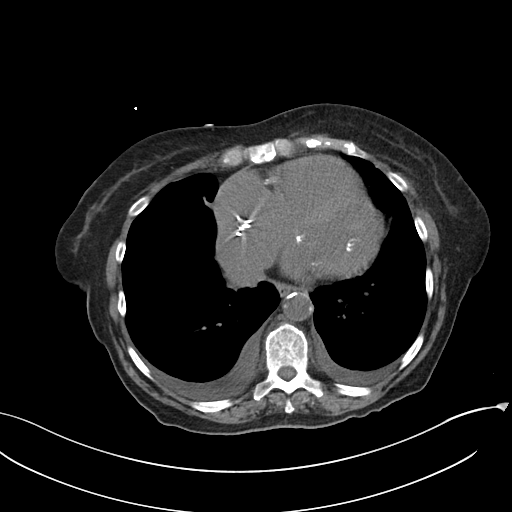

[Series 6: renal stone 3.0 cor · coronal · 0.75mm/px · 3 of 88 slices shown]
[im 30/88  soft-tissue]
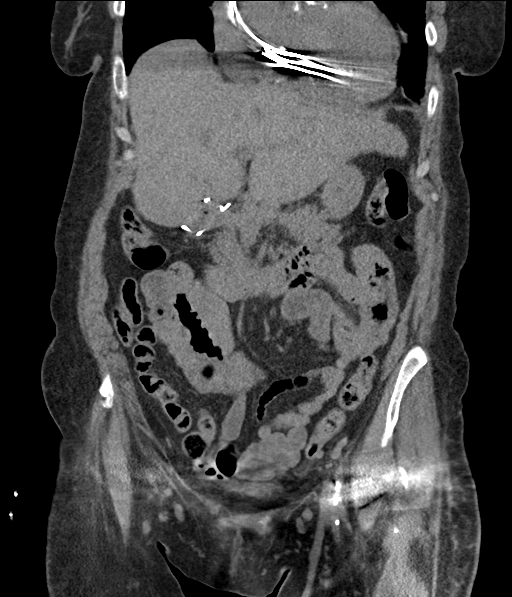
[im 39/88  soft-tissue]
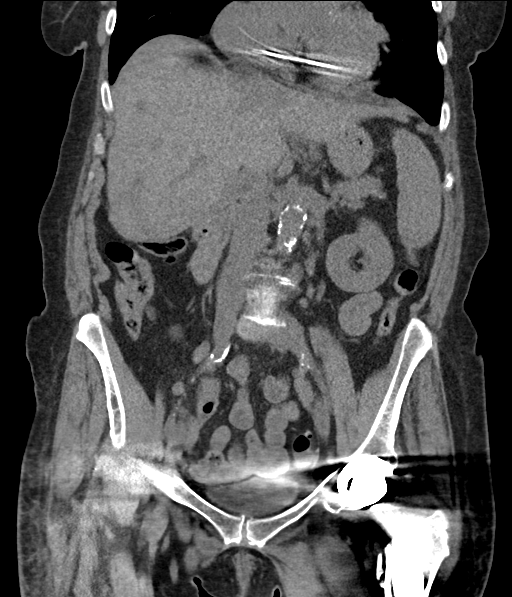
[im 49/88  soft-tissue]
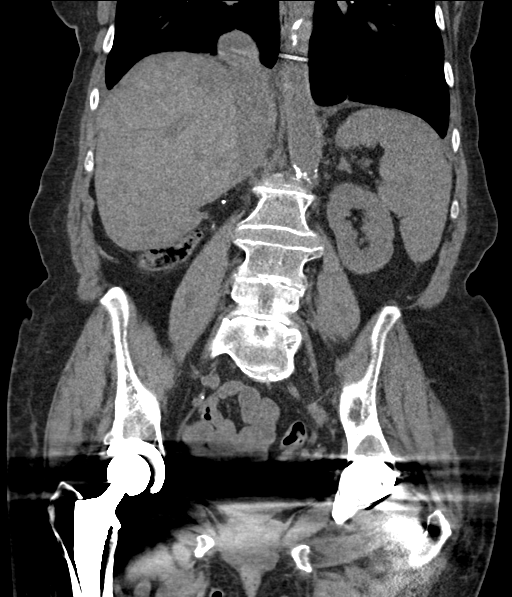

[16 of 46 positions shown; findings below may reference images not displayed]

FINDINGS: Lower chest: Small bilateral pleural effusions are noted.

Hepatobiliary: No focal liver abnormality is seen. Status post
cholecystectomy. No biliary dilatation.

Pancreas: Unremarkable. No pancreatic ductal dilatation or
surrounding inflammatory changes.

Spleen: Normal in size without focal abnormality.

Adrenals/Urinary Tract: Status post right nephrectomy. Right adrenal
gland is not clearly visualized. Left adrenal gland and kidney are
unremarkable. No hydronephrosis or renal obstruction is noted.
Urinary bladder is not well visualized due to bilateral scatter
artifact arising from bilateral hip prostheses.

Stomach/Bowel: The stomach is unremarkable. There is no evidence of
bowel obstruction or ileus. The appendix is not visualized.

Vascular/Lymphatic: Aortic atherosclerosis. No enlarged abdominal or
pelvic lymph nodes.

Reproductive: Status post hysterectomy. No adnexal masses.

Other: No abdominal wall hernia or abnormality. No abdominopelvic
ascites.

Musculoskeletal: Old L1 compression fracture is noted. No acute
osseous abnormality is noted.
IMPRESSION: Small bilateral pleural effusions are noted.

Status post right nephrectomy.

No hydronephrosis or renal obstruction is noted.

Aortic Atherosclerosis ([id number]).

## 2019-12-23 ENCOUNTER — Inpatient Hospital Stay: Payer: PPO | Attending: Oncology | Admitting: Oncology

## 2019-12-23 ENCOUNTER — Inpatient Hospital Stay: Payer: PPO

## 2019-12-23 ENCOUNTER — Ambulatory Visit: Payer: PPO

## 2019-12-23 ENCOUNTER — Encounter: Payer: Self-pay | Admitting: Oncology

## 2019-12-23 ENCOUNTER — Other Ambulatory Visit: Payer: Self-pay

## 2019-12-23 VITALS — BP 122/71 | HR 60 | Temp 98.2°F | Wt 153.7 lb

## 2019-12-23 DIAGNOSIS — Z95810 Presence of automatic (implantable) cardiac defibrillator: Secondary | ICD-10-CM | POA: Diagnosis not present

## 2019-12-23 DIAGNOSIS — Z79899 Other long term (current) drug therapy: Secondary | ICD-10-CM | POA: Insufficient documentation

## 2019-12-23 DIAGNOSIS — N184 Chronic kidney disease, stage 4 (severe): Secondary | ICD-10-CM

## 2019-12-23 DIAGNOSIS — R5383 Other fatigue: Secondary | ICD-10-CM

## 2019-12-23 DIAGNOSIS — Z803 Family history of malignant neoplasm of breast: Secondary | ICD-10-CM | POA: Diagnosis not present

## 2019-12-23 DIAGNOSIS — I509 Heart failure, unspecified: Secondary | ICD-10-CM

## 2019-12-23 DIAGNOSIS — Z87891 Personal history of nicotine dependence: Secondary | ICD-10-CM | POA: Diagnosis not present

## 2019-12-23 DIAGNOSIS — Z7901 Long term (current) use of anticoagulants: Secondary | ICD-10-CM | POA: Diagnosis not present

## 2019-12-23 DIAGNOSIS — J449 Chronic obstructive pulmonary disease, unspecified: Secondary | ICD-10-CM | POA: Diagnosis not present

## 2019-12-23 DIAGNOSIS — Z993 Dependence on wheelchair: Secondary | ICD-10-CM

## 2019-12-23 DIAGNOSIS — I4891 Unspecified atrial fibrillation: Secondary | ICD-10-CM

## 2019-12-23 DIAGNOSIS — D649 Anemia, unspecified: Secondary | ICD-10-CM | POA: Diagnosis not present

## 2019-12-23 DIAGNOSIS — Z8673 Personal history of transient ischemic attack (TIA), and cerebral infarction without residual deficits: Secondary | ICD-10-CM

## 2019-12-23 LAB — RETICULOCYTES
Immature Retic Fract: 30 % — ABNORMAL HIGH (ref 2.3–15.9)
RBC.: 3.31 MIL/uL — ABNORMAL LOW (ref 3.87–5.11)
Retic Count, Absolute: 116.2 10*3/uL (ref 19.0–186.0)
Retic Ct Pct: 3.5 % — ABNORMAL HIGH (ref 0.4–3.1)

## 2019-12-23 LAB — IRON AND TIBC
Iron: 20 ug/dL — ABNORMAL LOW (ref 28–170)
Saturation Ratios: 5 % — ABNORMAL LOW (ref 10.4–31.8)
TIBC: 426 ug/dL (ref 250–450)
UIBC: 406 ug/dL

## 2019-12-23 LAB — CBC WITH DIFFERENTIAL/PLATELET
Abs Immature Granulocytes: 0.04 K/uL (ref 0.00–0.07)
Basophils Absolute: 0 K/uL (ref 0.0–0.1)
Basophils Relative: 1 %
Eosinophils Absolute: 0 K/uL (ref 0.0–0.5)
Eosinophils Relative: 1 %
HCT: 28.2 % — ABNORMAL LOW (ref 36.0–46.0)
Hemoglobin: 8.5 g/dL — ABNORMAL LOW (ref 12.0–15.0)
Immature Granulocytes: 1 %
Lymphocytes Relative: 15 %
Lymphs Abs: 0.6 K/uL — ABNORMAL LOW (ref 0.7–4.0)
MCH: 25.1 pg — ABNORMAL LOW (ref 26.0–34.0)
MCHC: 30.1 g/dL (ref 30.0–36.0)
MCV: 83.4 fL (ref 80.0–100.0)
Monocytes Absolute: 0.5 K/uL (ref 0.1–1.0)
Monocytes Relative: 13 %
Neutro Abs: 2.8 K/uL (ref 1.7–7.7)
Neutrophils Relative %: 69 %
Platelets: 228 K/uL (ref 150–400)
RBC: 3.38 MIL/uL — ABNORMAL LOW (ref 3.87–5.11)
RDW: 17.4 % — ABNORMAL HIGH (ref 11.5–15.5)
WBC: 4 K/uL (ref 4.0–10.5)
nRBC: 0 % (ref 0.0–0.2)

## 2019-12-23 LAB — COMPREHENSIVE METABOLIC PANEL
ALT: 13 U/L (ref 0–44)
AST: 19 U/L (ref 15–41)
Albumin: 4.2 g/dL (ref 3.5–5.0)
Alkaline Phosphatase: 50 U/L (ref 38–126)
Anion gap: 10 (ref 5–15)
BUN: 45 mg/dL — ABNORMAL HIGH (ref 8–23)
CO2: 32 mmol/L (ref 22–32)
Calcium: 9.2 mg/dL (ref 8.9–10.3)
Chloride: 97 mmol/L — ABNORMAL LOW (ref 98–111)
Creatinine, Ser: 1.45 mg/dL — ABNORMAL HIGH (ref 0.44–1.00)
GFR calc Af Amer: 39 mL/min — ABNORMAL LOW (ref >60)
GFR calc non Af Amer: 33 mL/min — ABNORMAL LOW (ref >60)
Glucose, Bld: 95 mg/dL (ref 70–99)
Potassium: 4.1 mmol/L (ref 3.5–5.1)
Sodium: 139 mmol/L (ref 135–145)
Total Bilirubin: 0.7 mg/dL (ref 0.3–1.2)
Total Protein: 7.4 g/dL (ref 6.5–8.1)

## 2019-12-23 LAB — TSH: TSH: 2.253 u[IU]/mL (ref 0.350–4.500)

## 2019-12-23 LAB — FOLATE: Folate: 18.6 ng/mL (ref >5.9)

## 2019-12-23 LAB — FERRITIN: Ferritin: 12 ng/mL (ref 11–307)

## 2019-12-23 LAB — VITAMIN B12: Vitamin B-12: 241 pg/mL (ref 180–914)

## 2019-12-23 NOTE — Progress Notes (Signed)
Pt here for anemia. Had 4 infusions at [facility]. She states she is here to get more iron infusion. She is tired. Eats good, drinks liq. Mainly water and she has been given restriction of 4 bottles of water a day per doctor. She has 1 kidney and creat elevated per pt.

## 2019-12-24 LAB — HAPTOGLOBIN: Haptoglobin: 139 mg/dL (ref 41–333)

## 2019-12-25 ENCOUNTER — Encounter: Payer: Self-pay | Admitting: Oncology

## 2019-12-25 NOTE — Progress Notes (Signed)
Hematology/Oncology Consult note [facility] [phone] [phone]  Patient Care Team: Elby Beck, FNP as PCP - General (Nurse Practitioner) Deboraha Sprang, MD as PCP - Cardiology (Cardiology) Larey Dresser, MD as PCP - Advanced Heart Failure (Cardiology) Minna Merritts, MD as Consulting Physician (Cardiology) Deboraha Sprang, MD as Consulting Physician (Cardiology) [name], Dolores Lory, MD as Consulting Physician (Vascular Surgery) Debbora Dus, [facility] as Pharmacist (Pharmacist)   Name of the patient: [name]  [id number]  Jun 12, 1933    Reason for referral-iron deficiency anemia   Referring physician-Deborah [name], FNP  Date of visit: 12/25/19   History of presenting illness- Patient is a 86 year old female with a past medical history significant for hypothyroidism, TIA, stage IV CKD, A. fib, congestive heart failure, COPD, AICD placement among other medical problems.  She has been referred to me for anemia.  Her most recent CBC from 12/15/2019 showed white count of 4, H&H of 8.9/27.6 with an MCV of 82 and a platelet count of 225.  Looking back at her prior CBCs her hemoglobin was between 9-10 until July 2021 although looking back at her CBC for most of the last year her hemoglobin has been between 6-8.  She has also received Feraheme by cardiology in the past in June 2021.  She has had a complete GI work-up including EGD colonoscopy and capsule study by lab our GI in October 2020.  Colonoscopy showed patchy moderate inflammation in the sigmoid colon possibly ischemic and chronic but may explain the anemia.  Upper endoscopy showed mild gastritis but was otherwise normal.  Capsule endoscopy was negative.  Patient currently reports ongoing fatigue.  She is independent of her ADLs but needs assistance with her IADLs.  She is here with her granddaughter today  ECOG PS- 2  Pain scale- 0   Review of systems- Review of  Systems  Constitutional: Positive for malaise/fatigue. Negative for chills, fever and weight loss.  HENT: Negative for congestion, ear discharge and nosebleeds.   Eyes: Negative for blurred vision.  Respiratory: Negative for cough, hemoptysis, sputum production, shortness of breath and wheezing.   Cardiovascular: Negative for chest pain, palpitations, orthopnea and claudication.  Gastrointestinal: Negative for abdominal pain, blood in stool, constipation, diarrhea, heartburn, melena, nausea and vomiting.  Genitourinary: Negative for dysuria, flank pain, frequency, hematuria and urgency.  Musculoskeletal: Negative for back pain, joint pain and myalgias.  Skin: Negative for rash.  Neurological: Negative for dizziness, tingling, focal weakness, seizures, weakness and headaches.  Endo/Heme/Allergies: Does not bruise/bleed easily.  Psychiatric/Behavioral: Negative for depression and suicidal ideas. The patient does not have insomnia.     Allergies  Allergen Reactions  . Dilaudid [Hydromorphone] Other (See Comments)    Excessive Somnolence- Required Narcan  . Fentanyl Other (See Comments)    Required Narcan when given with Dilaudid. Tolerated single dose when given during procedure 02/03/2019.   Marland Kitchen Codeine Nausea And Vomiting    Hallucinations   . Morphine And Related Nausea And Vomiting    Hallucinations   . Sulfonamide Derivatives Other (See Comments)    Dry mouth    Patient Active Problem List   Diagnosis Date Noted  . Cellulitis of left leg 11/11/2019  . History of GI bleed 11/11/2019  . Chronic venous hypertension w ulceration ([place])   . Melena   . AKI (acute kidney injury) ([place]) 03/03/2019  . Symptomatic anemia 02/25/2019  . Acute on chronic diastolic CHF (congestive heart failure), NYHA class 4 ([place]) 11/04/2018  .  Acute blood loss anemia 11/04/2018  . Chronic bilateral pleural effusions 11/04/2018  . Shortness of breath 10/21/2018  . Displaced fracture of left femoral neck  ([place]) 10/09/2018  . Somnolence   . Closed left hip fracture ([place]) 10/07/2018  . Fall   . Hypokalemia   . Afib ([place]) 06/01/2018  . Urinary frequency 09/18/2017  . Iliac artery stenosis, bilateral ([place]) 07/14/2017  . Recurrent UTI 07/14/2017  . Long term (current) use of anticoagulants 05/08/2017  . Anemia 11/18/2016  . Dysuria 11/18/2016  . Pulmonary hypertension ([place]) 12/04/2015  . B12 deficiency 05/22/2015  . PAD (peripheral artery disease) ([place])   . Elevated serum creatinine   . Renal cell carcinoma ([place])   . History of nephrectomy secondary to [place]   . TIA (transient ischemic attack) 08/16/2014  . Weakness 01/14/2014  . Chronic kidney disease (CKD), stage IV (severe) ([place]) 12/08/2013  . Preoperative cardiovascular examination 06/07/2013  . Chronic diastolic CHF (congestive heart failure) ([place]) 06/04/2013  . Mitral valve regurgitation 06/04/2013  . Allergic rhinitis 01/01/2013  . Chronic cholecystitis with calculus 09/15/2012  . Claudication ([place]) 01/21/2012  . Dizziness 11/26/2011  . Hypertension 05/20/2011  . Avascular necrosis of hip ([place]) 05/03/2011  . OTHER AND UNSPECIFIED COAGULATION DEFECTS 10/13/2009  . RECTAL BLEEDING 10/13/2009  . HOCM / IHSS 07/14/2009  . EDEMA 07/14/2009  . Hyperlipidemia 04/10/2009  . ADJUSTMENT DISORDER WITH ANXIETY 04/10/2009  . INCONTINENCE, URGE 04/10/2009  . Non-ischemic cardiomyopathy ([place]) 10/27/2008  . Permanent atrial fibrillation 10/27/2008  . SINUS BRADYCARDIA 10/27/2008  . Carotid artery stenosis 10/27/2008  . COPD (chronic obstructive pulmonary disease) ([place]) 10/27/2008  . CKD (chronic kidney disease) stage 3, GFR 30-59 ml/min 10/27/2008  . ICD (implantable cardioverter-defibrillator) in place 10/27/2008     Past Medical History:  Diagnosis Date  . Adjustment disorder with anxiety   . Anemia   . Anginal pain ([place])   . Arthritis    "some in my hands" (11/11/2012)  . Atrial fibrillation ([place])   . Automatic implantable  cardioverter-defibrillator in situ   . Blood in stool   . Carotid artery stenosis 09/2007   a. 09/2007: 60-79% bilateral (stable); b. 10/2008: 40-59% R 60-79%   . Chest pain, unspecified   . Chronic airway obstruction, not elsewhere classified   . Chronic bronchitis ([place])    "get it some; not q year" (11/11/2012)  . Chronic diastolic CHF (congestive heart failure) ([place]) 06/04/2013  . Chronic kidney disease, unspecified   . Coronary artery disease    non-obstructive by 2006 cath  . Frequent UTI    "get them a couple times/yr" (11/11/2012)  . Hemorrhage of rectum and anus   . High cholesterol   . History of blood transfusion 04/2011   "after hip OR" (11/11/2012)  . Hx of renal cell cancer   . Hypertension 05/20/2011  . Hypertr obst cardiomyop   . Hypotension, unspecified    cardiac cath 2006..nonobstructive CAD 30-40s lesions.Marland KitchenETT 1/09 nondiagnostic due to poor HR response..Right Renal Cancer 2003  . Long term (current) use of anticoagulants   . Malignant neoplasm of kidney, except pelvis   . Osteoarthritis of right hip   . Other and unspecified coagulation defects   . PONV (postoperative nausea and vomiting)   . Renal cancer ([place]) 06/2001   Right  . Secondary cardiomyopathy, unspecified   . Sinus bradycardia   . Urge incontinence      Past Surgical History:  Procedure Laterality Date  . ABDOMINAL HYSTERECTOMY  1975  for benign causes  . APPENDECTOMY    . BI-VENTRICULAR PACEMAKER UPGRADE  05/04/2010  . BIOPSY  02/28/2019   Procedure: BIOPSY;  Surgeon: Milus Banister, MD;  [facility];  Service: Endoscopy;;  . CARDIAC CATHETERIZATION  2006  . CARDIOVERSION N/A 02/20/2018   Procedure: CARDIOVERSION;  Surgeon: Minna Merritts, MD;  Location: [place] [facility];  Service: Cardiovascular;  Laterality: N/A;  . CARDIOVERSION N/A 03/27/2018   Procedure: CARDIOVERSION;  Surgeon: Minna Merritts, MD;  Location: [facility] ORS;  Service: Cardiovascular;  Laterality: N/A;  . CATARACT  EXTRACTION W/ INTRAOCULAR LENS  IMPLANT, BILATERAL  01/2006-02-2006  . CHOLECYSTECTOMY N/A 11/11/2012   Procedure: LAPAROSCOPIC CHOLECYSTECTOMY WITH INTRAOPERATIVE CHOLANGIOGRAM;  Surgeon: Imogene Burn. Georgette Dover, MD;  Location: [place];  Service: General;  Laterality: N/A;  . COLONOSCOPY WITH PROPOFOL N/A 02/28/2019   Procedure: COLONOSCOPY WITH PROPOFOL;  Surgeon: Milus Banister, MD;  Location: [facility] ENDOSCOPY;  Service: Endoscopy;  Laterality: N/A;  . EP IMPLANTABLE DEVICE N/A 02/21/2016   Procedure: ICD Generator Changeout;  Surgeon: Deboraha Sprang, MD;  Location: [place] CV LAB;  Service: Cardiovascular;  Laterality: N/A;  . ESOPHAGOGASTRODUODENOSCOPY (EGD) WITH PROPOFOL N/A 02/28/2019   Procedure: ESOPHAGOGASTRODUODENOSCOPY (EGD) WITH PROPOFOL;  Surgeon: Milus Banister, MD;  [facility];  Service: Endoscopy;  Laterality: N/A;  . GIVENS CAPSULE STUDY N/A 03/15/2019   Procedure: GIVENS CAPSULE STUDY;  Surgeon: Thornton Park, MD;  Location: [place];  Service: Gastroenterology;  Laterality: N/A;  . INSERT / REPLACE / REMOVE PACEMAKER  05-01-11   02-28-05-/05-04-10-ICD-MEDTRONIC MAXIMAL DR  . JOINT REPLACEMENT    . LAPAROSCOPIC CHOLECYSTECTOMY  11/11/2012  . LAPAROSCOPIC LYSIS OF ADHESIONS N/A 11/11/2012   Procedure: LAPAROSCOPIC LYSIS OF ADHESIONS;  Surgeon: Imogene Burn. Georgette Dover, MD;  Location: [place];  Service: General;  Laterality: N/A;  . NEPHRECTOMY Right 06/2001    S/P RENAL CELL CANCER  . PRESSURE SENSOR/CARDIOMEMS N/A 02/03/2019   Procedure: PRESSURE SENSOR/CARDIOMEMS;  Surgeon: Larey Dresser, MD;  Location: [place] CV LAB;  Service: Cardiovascular;  Laterality: N/A;  . RIGHT HEART CATH N/A 11/09/2018   Procedure: RIGHT HEART CATH;  Surgeon: Larey Dresser, MD;  Location: [place] CV LAB;  Service: Cardiovascular;  Laterality: N/A;  . RIGHT HEART CATH N/A 03/08/2019   Procedure: RIGHT HEART CATH;  Surgeon: Larey Dresser, MD;  Location: [place] CV LAB;  Service:  Cardiovascular;  Laterality: N/A;  . SUBMUCOSAL TATTOO INJECTION  02/28/2019   Procedure: SUBMUCOSAL TATTOO INJECTION;  Surgeon: Milus Banister, MD;  Location[facility] ENDOSCOPY;  Service: Endoscopy;;  . TOTAL HIP ARTHROPLASTY Right 05/03/2011   Procedure: TOTAL HIP ARTHROPLASTY ANTERIOR APPROACH;  Surgeon: Mcarthur Rossetti;  Location: [facility];  Service: Orthopedics;  Laterality: Right;  Removal of Cannulated Screws Right Hip, Right Direct Anterior Hip Replacement  . TOTAL HIP ARTHROPLASTY Left 10/09/2018   Procedure: TOTAL HIP ARTHROPLASTY ANTERIOR APPROACH;  Surgeon: Rod Can, MD;  Location: [place];  Service: Orthopedics;  Laterality: Left;    Social History   Socioeconomic History  . Marital status: Widowed    Spouse name: Not on file  . Number of children: Not on file  . Years of education: Not on file  . Highest education level: Not on file  Occupational History  . Occupation: Retired    Fish farm manager: RETIRED  Tobacco Use  . Smoking status: Former Smoker    Packs/day: 0.50    Years: 40.00    Pack years: 20.00  Types: Cigarettes    Quit date: 05/06/2001    Years since quitting: 18.6  . Smokeless tobacco: Never Used  Vaping Use  . Vaping Use: Never used  Substance and Sexual Activity  . Alcohol use: No    Alcohol/week: 0.0 standard drinks  . Drug use: No  . Sexual activity: Never  Other Topics Concern  . 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   ALCOHOL USE-NO   RETIRED   FORMER TOBACCO USE.....1/2 PACK X 25 YRS UNTIL 2003   Would desire CPR   Social Determinants of Health   Financial Resource Strain:   . Difficulty of Paying Living Expenses: Not on file  Food Insecurity:   . Worried About Charity fundraiser in the Last Year: Not on file  . Ran Out of Food in the Last Year: Not on file    Transportation Needs:   . Lack of Transportation (Medical): Not on file  . Lack of Transportation (Non-Medical): Not on file  Physical Activity:   . Days of Exercise per Week: Not on file  . Minutes of Exercise per Session: Not on file  Stress:   . Feeling of Stress : Not on file  Social Connections:   . Frequency of Communication with Friends and Family: Not on file  . Frequency of Social Gatherings with Friends and Family: Not on file  . Attends Religious Services: Not on file  . Active Member of Clubs or Organizations: Not on file  . Attends Archivist Meetings: Not on file  . Marital Status: Not on file  Intimate Partner Violence:   . Fear of Current or Ex-Partner: Not on file  . Emotionally Abused: Not on file  . Physically Abused: Not on file  . Sexually Abused: Not on file     Family History  Problem Relation Age of Onset  . Heart failure Mother   . Breast cancer Maternal Aunt 82  . Breast cancer Cousin   . Breast cancer Other   . Colon cancer Neg Hx      Current Outpatient Medications:  .  acetaminophen (TYLENOL) 500 MG tablet, Take 500-1,000 mg by mouth every 8 (eight) hours as needed for mild pain or headache. , Disp: , Rfl:  .  cetirizine (ZYRTEC) 10 MG tablet, Take 10 mg by mouth daily. , Disp: , Rfl:  .  Cholecalciferol (VITAMIN D) 50 MCG (2000 UT) CAPS, Take 4,000 Units by mouth daily., Disp: , Rfl:  .  conjugated estrogens (PREMARIN) vaginal cream, Place 1 Applicatorful vaginally daily., Disp: 42.5 g, Rfl: 12 .  Cranberry (AZO CRANBERRY GUMMIES) 500 MG CHEW, Chew 2,000 mg by mouth daily., Disp: , Rfl:  .  ezetimibe (ZETIA) 10 MG tablet, Take 1 tablet (10 mg total) by mouth daily., Disp: 30 tablet, Rfl: 11 .  ferrous sulfate 325 (65 FE) MG tablet, Take 1 tablet (325 mg total) by mouth 2 (two) times daily with a meal., Disp: 60 tablet, Rfl: 0 .  fluticasone (FLONASE) 50 MCG/ACT nasal spray, Place 2 sprays into both nostrils daily as needed for  allergies or rhinitis., Disp: 16 g, Rfl: 6 .  levothyroxine (SYNTHROID) 25 MCG tablet, TAKE 1 TABLET BY MOUTH ONCE DAILY BEFOREBREAKFAST, Disp: 30 tablet, Rfl: 3 .  magnesium oxide (MAG-OX) 400 MG  tablet, Take 1 tablet (400 mg total) by mouth daily., Disp: 30 tablet, Rfl: 2 .  metolazone (ZAROXOLYN) 5 MG tablet, Take 1 tablet (5 mg total) by mouth 2 (two) times a week. Tuesday and Saturday, Disp: 12 tablet, Rfl: 4 .  potassium chloride SA (KLOR-CON) 20 MEQ tablet, Take 2 tablets (40 mEq total) by mouth 2 (two) times daily. 1 extra tab on metolazone days, Disp: 90 tablet, Rfl: 3 .  rosuvastatin (CRESTOR) 40 MG tablet, Take 1 tablet (40 mg total) by mouth daily., Disp: 90 tablet, Rfl: 3 .  torsemide (DEMADEX) 100 MG tablet, Take 1 tablet (100 mg total) by mouth 2 (two) times daily., Disp: 180 tablet, Rfl: 3 .  vitamin C (ASCORBIC ACID) 250 MG tablet, Take 500 mg by mouth daily., Disp: , Rfl:  .  warfarin (COUMADIN) 2 MG tablet, TAKE 1 TABLETS BY MOUTH DAILY AT 6PM AS DIRECTED BY ANTICOAGULATION CLINIC., Disp: 90 tablet, Rfl: 1 .  bisacodyl (DULCOLAX) 5 MG EC tablet, Take 1 tablet (5 mg total) by mouth daily as needed for moderate constipation. (Patient not taking: Reported on 12/23/2019), Disp: 10 tablet, Rfl: 0   Physical exam:  Vitals:   12/23/19 1343  BP: 122/71  Pulse: 60  Temp: 98.2 F (36.8 C)  TempSrc: Tympanic  SpO2: 98%  Weight: 153 lb 11.2 oz (69.7 kg)   Physical Exam Constitutional:      Comments: Elderly female sitting in a wheelchair.  Appears fatigued  Cardiovascular:     Rate and Rhythm: Normal rate. Rhythm irregular.     Heart sounds: Murmur heard.   Pulmonary:     Effort: Pulmonary effort is normal.     Breath sounds: Normal breath sounds.  Musculoskeletal:     Comments: Bilateral +1 edema  Skin:    General: Skin is warm and dry.  Neurological:     Mental Status: She is alert and oriented to person, place, and time.        CMP Latest Ref Rng & Units 12/23/2019   Glucose 70 - 99 mg/dL 95  BUN 8 - 23 mg/dL 45(H)  Creatinine 0.44 - 1.00 mg/dL 1.45(H)  Sodium 135 - 145 mmol/L 139  Potassium 3.5 - 5.1 mmol/L 4.1  Chloride 98 - 111 mmol/L 97(L)  CO2 22 - 32 mmol/L 32  Calcium 8.9 - 10.3 mg/dL 9.2  Total Protein 6.5 - 8.1 g/dL 7.4  Total Bilirubin 0.3 - 1.2 mg/dL 0.7  Alkaline Phos 38 - 126 U/L 50  AST 15 - 41 U/L 19  ALT 0 - 44 U/L 13   CBC Latest Ref Rng & Units 12/23/2019  WBC 4.0 - 10.5 K/uL 4.0  Hemoglobin 12.0 - 15.0 g/dL 8.5(L)  Hematocrit 36 - 46 % 28.2(L)  Platelets 150 - 400 K/uL 228     Assessment and plan- Patient is a 86 y.o. female with multiple comorbidities referred for normocytic anemia   Patient has remained chronically anemic with a hemoglobin between 6-8 for most of her last year.  Today I will get a complete anemia work-up including CBC with differential, CMP, ferritin and iron studies, B12 and folate, TSH, haptoglobin, reticulocyte count and myeloma panel.  If there is any reversible cause of anemia I would like to fix that first.  There may be also a component of anemia of chronic kidney disease.  She may be a candidate for EPO.  However if she is iron deficient or B12 deficient that needs to be fixed first before  giving her a trial of EPO.  I will see her back in 2 weeks time for in person or video visit to discuss the results of blood work  Thank you for this kind referral and the opportunity to participate in the care of this  Patient   Visit Diagnosis 1. Normocytic anemia     Dr. Randa Evens, MD, MPH [facility] [phone] 12/25/2019

## 2019-12-27 ENCOUNTER — Telehealth: Payer: Self-pay | Admitting: Family Medicine

## 2019-12-27 LAB — MULTIPLE MYELOMA PANEL, SERUM
Albumin SerPl Elph-Mcnc: 3.7 g/dL (ref 2.9–4.4)
Albumin/Glob SerPl: 1.5 (ref 0.7–1.7)
Alpha 1: 0.2 g/dL (ref 0.0–0.4)
Alpha2 Glob SerPl Elph-Mcnc: 0.6 g/dL (ref 0.4–1.0)
B-Globulin SerPl Elph-Mcnc: 0.8 g/dL (ref 0.7–1.3)
Gamma Glob SerPl Elph-Mcnc: 0.9 g/dL (ref 0.4–1.8)
Globulin, Total: 2.6 g/dL (ref 2.2–3.9)
IgA: 134 mg/dL (ref 64–422)
IgG (Immunoglobin G), Serum: 1024 mg/dL (ref 586–1602)
IgM (Immunoglobulin M), Srm: 31 mg/dL (ref 26–217)
Total Protein ELP: 6.3 g/dL (ref 6.0–8.5)

## 2019-12-27 NOTE — Telephone Encounter (Signed)
I spoke with Jackelyn Poling RN with Access nurse; pt has hx of weakness and having to have previous blood transfusions. Hx of Afib and CHF. On 12/23/19 oncologist did cbc wit diff with hgb 8.5  And RBC 3.38. Access disposition is to be seen within 24 hrs. But now that Jackelyn Poling knows hgb was 8.5 she said pt should not wait 24 hrs; these are relatively new symptoms for pt this time per Jackelyn Poling RN with Access nurse. I spoke with Glenda Chroman FNP and she advised for access nurse to contact oncologist and see their recommendation; Debbie RN  said they do not contact the oncologist and pt will contact oncologist to go over lab results and symptoms. Glenda Chroman FNP wanted to know pts weight. Debbie RN did not know pts weight and will advise pt to contact oncologist and if unable to reach oncologist pt is to go to UC. FYI to Glenda Chroman FNP.

## 2019-12-27 NOTE — Telephone Encounter (Signed)
Sent triage Pt stated she was weak and dizzy

## 2019-12-27 NOTE — Telephone Encounter (Signed)
Called and spoke to patient regarding symptoms .  She has been having some increased weakness and fatigue as well as DOE.  Right 3 days ago was 149, yesterday 150, 152.6 this a.m.  She is due to take her metolazone tomorrow. Bun elevated on recent blood work.  I will send message to CHF providers and see if they can look at her cardio mems numbers to see where she needs to be with her fluid.  She reports that she feels a little tight and puffy around her abdomen.  Is wearing her compression socks and leg swelling is no worse.  Advised her that since it is the end of the day, I will get back with her tomorrow.

## 2019-12-28 NOTE — Telephone Encounter (Signed)
Received message from Darrick Grinder, NP, heart failure clinic regarding Cardiomems status. Patient was at 8 yesterday with goal of 17. She will check later in week. Called patient. Weight today 154 after showering, discussed going ahead and taking her metolazone today. I will touch base with her regarding effects and her weight tomorrow.

## 2019-12-28 NOTE — Telephone Encounter (Signed)
[facility] - Client TELEPHONE ADVICE RECORD [facility]urse Patient Name: [name] Gender: Female DOB: April 11, 1934 Age: 86 Y 9 M 18 D Return Phone Number: [phone] (Primary), [phone] (Secondary) Address: [place]/State/Zip: [place] [address] Client [facility] - Client Client Site [place] - Day Physician Tor Netters- NP Contact Type Call Who Is Calling Patient / Member / Family / Caregiver Call Type Triage / Clinical Relationship To Patient Self Return Phone Number [phone] (Secondary) Chief Complaint Dizziness Reason for Call Symptomatic / Request for [place] states she is weak and dizzy. She was recommended to go to cancer center. She is still weak. She has had blood infusions, no cancer. She was in hospital for 5 weeks and has went through 5 pints of blood. Dizzy per charge. She needs help intrepreting lab results as well. It is not new but ongoing. Translation No Nurse Assessment Nurse: Zorita Pang, RN, Deborah Date/Time (Eastern Time): 12/27/2019 2:52:13 PM Confirm and document reason for call. If symptomatic, describe symptoms. ---The caller states that she has dizziness and weakness. She states that her hgb keeps dropping. They referred her to an oncologist and she saw her last week. She states that she is having weakness. She wants to know if she should call her call the oncologist or the primary care daughter. She states that the results are on her mychart. Has the patient had close contact with a person known or suspected to have the novel coronavirus illness OR traveled / lives in area with major community spread (including international travel) in the last 14 days from the onset of symptoms? * If Asymptomatic, screen for exposure and travel within the last 14 days. ---No Does the patient have any new or worsening symptoms?  ---Yes Will a triage be completed? ---Yes Related visit to physician within the last 2 weeks? ---Yes Does the PT have any chronic conditions? (i.e. diabetes, asthma, this includes High risk factors for pregnancy, etc.) ---Yes List chronic conditions. ---kidney cancer and had nephrectomy, CHF, and venous problems Is this a behavioral health or substance abuse call? ---No PLEASE NOTE: All timestamps contained within this report are represented as [place] Standard Time. CONFIDENTIALTY NOTICE: This fax transmission is intended only for the addressee. It contains information that is legally privileged, confidential or otherwise protected from use or disclosure. If you are not the intended recipient, you are strictly prohibited from reviewing, disclosing, copying using or disseminating any of this information or taking any action in reliance on or regarding this information. If you have received this fax in error, please notify us immediately by telephone so that we can arrange for its return to [place]. Phone: [phone], Toll-Free: [phone], Fax: [phone] Page: 2 of 2 Call Id: [id number] Guidelines Guideline Title Affirmed Question Affirmed Notes Nurse Date/Time Eilene Ghazi Time) Weakness (Generalized) and Fatigue [1] MODERATE weakness (i.e., interferes with work, school, normal activities) AND [2] persists > 3 days Zorita Pang, Investment banker, operational 12/27/2019 2:56:41 PM Disp. Time Eilene Ghazi Time) Disposition Final User 12/27/2019 3:08:02 PM See PCP within 24 Hours Yes Zorita Pang, RN, Garrel Ridgel Disagree/Comply Comply Caller Understands Yes PreDisposition Call Doctor Care Advice Given Per Guideline SEE PCP WITHIN 24 HOURS: Comments User: Marquis Buggy, RN Date/Time Eilene Ghazi Time): 12/27/2019 3:25:56 PM Called the office and Hgb is 8.5. Nurse states that she wants to go speak with one of the providers about what to tell the patient to do. Put this nurse on  hold several times trying to find a  provider who can see the caller and what to do. In the interim the caller disconnected. (After the initial interview the caller added that she also has A fib.) Office staff states that she needs to call the oncology group. User: Marquis Buggy, RN Date/Time Eilene Ghazi Time): 12/27/2019 3:28:34 PM The caller was notifed that she should call the oncologist and she verbalized understanding or go to UC/ED and she verbalized understanding. Referrals REFERRED TO PCP OFFICE

## 2019-12-28 NOTE — Telephone Encounter (Signed)
Per this phone note Glenda Chroman FNP spoke with pt on 12/27/19. FYI to Glenda Chroman FNP.

## 2019-12-29 NOTE — Telephone Encounter (Signed)
Called and spoke with patient.  Her weight is down to 152.8 today.  She feels a little less abdominal fullness.  Overall she feels that her dizziness is improved but she continues to still feel weak especially after trying to do household chores.  I am going to touch base with Amy Clegg, CHF NP to follow-up on her [employer] numbers.  Will follow up with patient once information obtained.

## 2020-01-06 ENCOUNTER — Telehealth: Payer: Self-pay | Admitting: Family Medicine

## 2020-01-06 ENCOUNTER — Inpatient Hospital Stay: Payer: PPO | Attending: Oncology | Admitting: Oncology

## 2020-01-06 DIAGNOSIS — E538 Deficiency of other specified B group vitamins: Secondary | ICD-10-CM | POA: Diagnosis not present

## 2020-01-06 DIAGNOSIS — D649 Anemia, unspecified: Secondary | ICD-10-CM | POA: Insufficient documentation

## 2020-01-06 DIAGNOSIS — D508 Other iron deficiency anemias: Secondary | ICD-10-CM | POA: Insufficient documentation

## 2020-01-06 DIAGNOSIS — D509 Iron deficiency anemia, unspecified: Secondary | ICD-10-CM | POA: Insufficient documentation

## 2020-01-06 NOTE — Telephone Encounter (Signed)
Pt called stating she went to see cancer dr.  DR Janese Banks she stated dr Janese Banks is doing more iron infusion and she would like to discuss with you .  She stated she is losing blood and they cannot find out where it is going.  And iron is really low.  She stated she was getting the same thing that she got from dr [name] her CHF dr.

## 2020-01-06 NOTE — Addendum Note (Signed)
Addended by: Kern Alberta on: 01/06/2020 03:02 PM   Modules accepted: Orders

## 2020-01-06 NOTE — Progress Notes (Signed)
I connected with [name] on 01/06/20 at 11:30 AM EDT by video enabled telemedicine visit and verified that I am speaking with the correct person using two identifiers.   I discussed the limitations, risks, security and privacy concerns of performing an evaluation and management service by telemedicine and the availability of in-person appointments. I also discussed with the patient that there may be a patient responsible charge related to this service. The patient expressed understanding and agreed to proceed.  Other persons participating in the visit and their role in the encounter:  none  Patient's location:  home Provider's location:  work  Risk analyst Complaint: Discuss results of blood work  History of present illness: Patient is a 86 year old female with a past medical history significant for hypothyroidism, TIA, stage IV CKD, A. fib, congestive heart failure, COPD, AICD placement among other medical problems.  She has been referred to me for anemia.  Her most recent CBC from 12/15/2019 showed white count of 4, H&H of 8.9/27.6 with an MCV of 82 and a platelet count of 225.  Looking back at her prior CBCs her hemoglobin was between 9-10 until July 2021 although looking back at her CBC for most of the last year her hemoglobin has been between 6-8.  She has also received Feraheme by cardiology in the past in June 2021.  She has had a complete GI work-up including EGD colonoscopy and capsule study by lab our GI in October 2020.  Colonoscopy showed patchy moderate inflammation in the sigmoid colon possibly ischemic and chronic but may explain the anemia.  Upper endoscopy showed mild gastritis but was otherwise normal.  Capsule endoscopy was negative.  Interval history patient reports ongoing fatigue.  Denies any new complaints at this time   Review of Systems  Constitutional: Positive for malaise/fatigue. Negative for chills, fever and weight loss.  HENT: Negative for congestion, ear discharge  and nosebleeds.   Eyes: Negative for blurred vision.  Respiratory: Negative for cough, hemoptysis, sputum production, shortness of breath and wheezing.   Cardiovascular: Negative for chest pain, palpitations, orthopnea and claudication.  Gastrointestinal: Negative for abdominal pain, blood in stool, constipation, diarrhea, heartburn, melena, nausea and vomiting.  Genitourinary: Negative for dysuria, flank pain, frequency, hematuria and urgency.  Musculoskeletal: Negative for back pain, joint pain and myalgias.  Skin: Negative for rash.  Neurological: Negative for dizziness, tingling, focal weakness, seizures, weakness and headaches.  Endo/Heme/Allergies: Does not bruise/bleed easily.  Psychiatric/Behavioral: Negative for depression and suicidal ideas. The patient does not have insomnia.     Allergies  Allergen Reactions  . Dilaudid [Hydromorphone] Other (See Comments)    Excessive Somnolence- Required Narcan  . Fentanyl Other (See Comments)    Required Narcan when given with Dilaudid. Tolerated single dose when given during procedure 02/03/2019.   Marland Kitchen Codeine Nausea And Vomiting    Hallucinations   . Morphine And Related Nausea And Vomiting    Hallucinations   . Sulfonamide Derivatives Other (See Comments)    Dry mouth    Past Medical History:  Diagnosis Date  . Adjustment disorder with anxiety   . Anemia   . Anginal pain ([place])   . Arthritis    "some in my hands" (11/11/2012)  . Atrial fibrillation ([place])   . Automatic implantable cardioverter-defibrillator in situ   . Blood in stool   . Carotid artery stenosis 09/2007   a. 09/2007: 60-79% bilateral (stable); b. 10/2008: 40-59% R 60-79%   . Chest pain, unspecified   . Chronic airway obstruction,  not elsewhere classified   . Chronic bronchitis ([place])    "get it some; not q year" (11/11/2012)  . Chronic diastolic CHF (congestive heart failure) ([place]) 06/04/2013  . Chronic kidney disease, unspecified   . Coronary artery disease     non-obstructive by 2006 cath  . Frequent UTI    "get them a couple times/yr" (11/11/2012)  . Hemorrhage of rectum and anus   . High cholesterol   . History of blood transfusion 04/2011   "after hip OR" (11/11/2012)  . Hx of renal cell cancer   . Hypertension 05/20/2011  . Hypertr obst cardiomyop   . Hypotension, unspecified    cardiac cath 2006..nonobstructive CAD 30-40s lesions.Marland KitchenETT 1/09 nondiagnostic due to poor HR response..Right Renal Cancer 2003  . Long term (current) use of anticoagulants   . Malignant neoplasm of kidney, except pelvis   . Osteoarthritis of right hip   . Other and unspecified coagulation defects   . PONV (postoperative nausea and vomiting)   . Renal cancer ([place]) 06/2001   Right  . Secondary cardiomyopathy, unspecified   . Sinus bradycardia   . Urge incontinence     Past Surgical History:  Procedure Laterality Date  . ABDOMINAL HYSTERECTOMY  1975   for benign causes  . APPENDECTOMY    . BI-VENTRICULAR PACEMAKER UPGRADE  05/04/2010  . BIOPSY  02/28/2019   Procedure: BIOPSY;  Surgeon: Milus Banister, MD;  Location[facility] ENDOSCOPY;  Service: Endoscopy;;  . CARDIAC CATHETERIZATION  2006  . CARDIOVERSION N/A 02/20/2018   Procedure: CARDIOVERSION;  Surgeon: Minna Merritts, MD;  Location: [place] ORS;  Service: Cardiovascular;  Laterality: N/A;  . CARDIOVERSION N/A 03/27/2018   Procedure: CARDIOVERSION;  Surgeon: Minna Merritts, MD;  Location: [facility] ORS;  Service: Cardiovascular;  Laterality: N/A;  . CATARACT EXTRACTION W/ INTRAOCULAR LENS  IMPLANT, BILATERAL  01/2006-02-2006  . CHOLECYSTECTOMY N/A 11/11/2012   Procedure: LAPAROSCOPIC CHOLECYSTECTOMY WITH INTRAOPERATIVE CHOLANGIOGRAM;  Surgeon: Imogene Burn. Georgette Dover, MD;  Location: [place];  Service: General;  Laterality: N/A;  . COLONOSCOPY WITH PROPOFOL N/A 02/28/2019   Procedure: COLONOSCOPY WITH PROPOFOL;  Surgeon: Milus Banister, MD;  Location: [facility] ENDOSCOPY;  Service: Endoscopy;  Laterality: N/A;  . EP IMPLANTABLE  DEVICE N/A 02/21/2016   Procedure: ICD Generator Changeout;  Surgeon: Deboraha Sprang, MD;  Location: [place] CV LAB;  Service: Cardiovascular;  Laterality: N/A;  . ESOPHAGOGASTRODUODENOSCOPY (EGD) WITH PROPOFOL N/A 02/28/2019   Procedure: ESOPHAGOGASTRODUODENOSCOPY (EGD) WITH PROPOFOL;  Surgeon: Milus Banister, MD;  Location: [facility] ENDOSCOPY;  Service: Endoscopy;  Laterality: N/A;  . GIVENS CAPSULE STUDY N/A 03/15/2019   Procedure: GIVENS CAPSULE STUDY;  Surgeon: Thornton Park, MD;  Location: [place];  Service: Gastroenterology;  Laterality: N/A;  . INSERT / REPLACE / REMOVE PACEMAKER  05-01-11   02-28-05-/05-04-10-ICD-MEDTRONIC MAXIMAL DR  . JOINT REPLACEMENT    . LAPAROSCOPIC CHOLECYSTECTOMY  11/11/2012  . LAPAROSCOPIC LYSIS OF ADHESIONS N/A 11/11/2012   Procedure: LAPAROSCOPIC LYSIS OF ADHESIONS;  Surgeon: Imogene Burn. Georgette Dover, MD;  Location: [place];  Service: General;  Laterality: N/A;  . NEPHRECTOMY Right 06/2001    S/P RENAL CELL CANCER  . PRESSURE SENSOR/CARDIOMEMS N/A 02/03/2019   Procedure: PRESSURE SENSOR/CARDIOMEMS;  Surgeon: Larey Dresser, MD;  Location: [place] CV LAB;  Service: Cardiovascular;  Laterality: N/A;  . RIGHT HEART CATH N/A 11/09/2018   Procedure: RIGHT HEART CATH;  Surgeon: Larey Dresser, MD;  Location: [place] CV LAB;  Service: Cardiovascular;  Laterality: N/A;  . RIGHT  HEART CATH N/A 03/08/2019   Procedure: RIGHT HEART CATH;  Surgeon: Larey Dresser, MD;  Location: [place] CV LAB;  Service: Cardiovascular;  Laterality: N/A;  . SUBMUCOSAL TATTOO INJECTION  02/28/2019   Procedure: SUBMUCOSAL TATTOO INJECTION;  Surgeon: Milus Banister, MD;  Location[facility] ENDOSCOPY;  Service: Endoscopy;;  . TOTAL HIP ARTHROPLASTY Right 05/03/2011   Procedure: TOTAL HIP ARTHROPLASTY ANTERIOR APPROACH;  Surgeon: Mcarthur Rossetti;  Location: [facility] ORS;  Service: Orthopedics;  Laterality: Right;  Removal of Cannulated Screws Right Hip, Right Direct Anterior Hip  Replacement  . TOTAL HIP ARTHROPLASTY Left 10/09/2018   Procedure: TOTAL HIP ARTHROPLASTY ANTERIOR APPROACH;  Surgeon: Rod Can, MD;  Location: [place];  Service: Orthopedics;  Laterality: Left;    Social History   Socioeconomic History  . Marital status: Widowed    Spouse name: Not on file  . Number of children: Not on file  . Years of education: Not on file  . Highest education level: Not on file  Occupational History  . Occupation: Retired    Fish farm manager: RETIRED  Tobacco Use  . Smoking status: Former Smoker    Packs/day: 0.50    Years: 40.00    Pack years: 20.00    Types: Cigarettes    Quit date: 05/06/2001    Years since quitting: 18.6  . Smokeless tobacco: Never Used  Vaping Use  . Vaping Use: Never used  Substance and Sexual Activity  . Alcohol use: No    Alcohol/week: 0.0 standard drinks  . Drug use: No  . Sexual activity: Never  Other Topics Concern  . 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   ALCOHOL USE-NO   RETIRED   FORMER TOBACCO USE.....1/2 PACK X 34 YRS UNTIL 2003   Would desire CPR   Social Determinants of Health   Financial Resource Strain:   . Difficulty of Paying Living Expenses: Not on file  Food Insecurity:   . Worried About Charity fundraiser in the Last Year: Not on file  . Ran Out of Food in the Last Year: Not on file  Transportation Needs:   . Lack of Transportation (Medical): Not on file  . Lack of Transportation (Non-Medical): Not on file  Physical Activity:   . Days of Exercise per Week: Not on file  . Minutes of Exercise per Session: Not on file  Stress:   . Feeling of Stress : Not on file  Social Connections:   . Frequency of Communication with Friends and Family: Not on file  . Frequency of Social Gatherings with Friends and Family: Not on file  . Attends Religious  Services: Not on file  . Active Member of Clubs or Organizations: Not on file  . Attends Archivist Meetings: Not on file  . Marital Status: Not on file  Intimate Partner Violence:   . Fear of Current or Ex-Partner: Not on file  . Emotionally Abused: Not on file  . Physically Abused: Not on file  . Sexually Abused: Not on file    Family History  Problem Relation Age of Onset  . Heart failure Mother   . Breast cancer Maternal Aunt 82  . Breast cancer Cousin   . Breast cancer Other   . Colon cancer Neg Hx  Current Outpatient Medications:  .  acetaminophen (TYLENOL) 500 MG tablet, Take 500-1,000 mg by mouth every 8 (eight) hours as needed for mild pain or headache. , Disp: , Rfl:  .  bisacodyl (DULCOLAX) 5 MG EC tablet, Take 1 tablet (5 mg total) by mouth daily as needed for moderate constipation. (Patient not taking: Reported on 12/23/2019), Disp: 10 tablet, Rfl: 0 .  cetirizine (ZYRTEC) 10 MG tablet, Take 10 mg by mouth daily. , Disp: , Rfl:  .  Cholecalciferol (VITAMIN D) 50 MCG (2000 UT) CAPS, Take 4,000 Units by mouth daily., Disp: , Rfl:  .  conjugated estrogens (PREMARIN) vaginal cream, Place 1 Applicatorful vaginally daily., Disp: 42.5 g, Rfl: 12 .  Cranberry (AZO CRANBERRY GUMMIES) 500 MG CHEW, Chew 2,000 mg by mouth daily., Disp: , Rfl:  .  ezetimibe (ZETIA) 10 MG tablet, Take 1 tablet (10 mg total) by mouth daily., Disp: 30 tablet, Rfl: 11 .  ferrous sulfate 325 (65 FE) MG tablet, Take 1 tablet (325 mg total) by mouth 2 (two) times daily with a meal., Disp: 60 tablet, Rfl: 0 .  fluticasone (FLONASE) 50 MCG/ACT nasal spray, Place 2 sprays into both nostrils daily as needed for allergies or rhinitis., Disp: 16 g, Rfl: 6 .  levothyroxine (SYNTHROID) 25 MCG tablet, TAKE 1 TABLET BY MOUTH ONCE DAILY BEFOREBREAKFAST, Disp: 30 tablet, Rfl: 3 .  magnesium oxide (MAG-OX) 400 MG tablet, Take 1 tablet (400 mg total) by mouth daily., Disp: 30 tablet, Rfl: 2 .  metolazone  (ZAROXOLYN) 5 MG tablet, Take 1 tablet (5 mg total) by mouth 2 (two) times a week. Tuesday and Saturday, Disp: 12 tablet, Rfl: 4 .  potassium chloride SA (KLOR-CON) 20 MEQ tablet, Take 2 tablets (40 mEq total) by mouth 2 (two) times daily. 1 extra tab on metolazone days, Disp: 90 tablet, Rfl: 3 .  rosuvastatin (CRESTOR) 40 MG tablet, Take 1 tablet (40 mg total) by mouth daily., Disp: 90 tablet, Rfl: 3 .  torsemide (DEMADEX) 100 MG tablet, Take 1 tablet (100 mg total) by mouth 2 (two) times daily., Disp: 180 tablet, Rfl: 3 .  vitamin C (ASCORBIC ACID) 250 MG tablet, Take 500 mg by mouth daily., Disp: , Rfl:  .  warfarin (COUMADIN) 2 MG tablet, TAKE 1 TABLETS BY MOUTH DAILY AT 6PM AS DIRECTED BY ANTICOAGULATION CLINIC., Disp: 90 tablet, Rfl: 1  No results found.  No images are attached to the encounter.   CMP Latest Ref Rng & Units 12/23/2019  Glucose 70 - 99 mg/dL 95  BUN 8 - 23 mg/dL 45(H)  Creatinine 0.44 - 1.00 mg/dL 1.45(H)  Sodium 135 - 145 mmol/L 139  Potassium 3.5 - 5.1 mmol/L 4.1  Chloride 98 - 111 mmol/L 97(L)  CO2 22 - 32 mmol/L 32  Calcium 8.9 - 10.3 mg/dL 9.2  Total Protein 6.5 - 8.1 g/dL 7.4  Total Bilirubin 0.3 - 1.2 mg/dL 0.7  Alkaline Phos 38 - 126 U/L 50  AST 15 - 41 U/L 19  ALT 0 - 44 U/L 13   CBC Latest Ref Rng & Units 12/23/2019  WBC 4.0 - 10.5 K/uL 4.0  Hemoglobin 12.0 - 15.0 g/dL 8.5(L)  Hematocrit 36 - 46 % 28.2(L)  Platelets 150 - 400 K/uL 228     Observation/objective: Appears in no acute distress over video visit today.  Breathing is nonlabored  Assessment and plan: Patient is a 86 year old female referred for normocytic anemia likely multifactorial secondary to iron and B12 deficiency as well  as anemia of chronic kidney disease  Results of recent blood work show a low ferritin of 12 along with low iron saturation of 5%.  B12 levels were also borderline low at 241.  Patient last received Feraheme in May and June 2021.  However labs are still indicated of  ongoing iron deficiency and before proceeding with EPO I would like her ferritin levels to be closer to 200.  I will therefore plan to give her 2 more doses of Feraheme 510 mg weekly along with 2 doses of B12.  I also encouraged the patient to take oral B12 1000 mcg daily.  Follow-up instructions: Repeat CBC ferritin and iron studies as well as B12 in 2 months followed by video visit 2 to 3 days later I discussed the assessment and treatment plan with the patient. The patient was provided an opportunity to ask questions and all were answered. The patient agreed with the plan and demonstrated an understanding of the instructions.   The patient was advised to call back or seek an in-person evaluation if the symptoms worsen or if the condition fails to improve as anticipated.   Visit Diagnosis: 1. B12 deficiency   2. Other iron deficiency anemia     Dr. Randa Evens, MD, MPH [facility] Tel- [phone] 01/06/2020 1:44 PM

## 2020-01-07 NOTE — Telephone Encounter (Signed)
Called patient on both of her numbers, mobile and home, no answer, no voice mail available, will try again later.

## 2020-01-11 ENCOUNTER — Other Ambulatory Visit: Payer: Self-pay

## 2020-01-11 ENCOUNTER — Ambulatory Visit (INDEPENDENT_AMBULATORY_CARE_PROVIDER_SITE_OTHER): Payer: PPO

## 2020-01-11 DIAGNOSIS — Z7901 Long term (current) use of anticoagulants: Secondary | ICD-10-CM

## 2020-01-11 LAB — POCT INR: INR: 2.1 (ref 2.0–3.0)

## 2020-01-11 NOTE — Patient Instructions (Addendum)
Pre visit review using our clinic review tool, if applicable. No additional management support is needed unless otherwise documented below in the visit note.  Continue 3mg  daily except take 2mg  on Wednesdays. Recheck in 4 wks.

## 2020-01-14 ENCOUNTER — Inpatient Hospital Stay: Payer: PPO

## 2020-01-14 NOTE — Telephone Encounter (Signed)
Called and spoke with patient.  Discussed the plan from hematology and answered her questions.  She reports that she has been feeling a little stronger today.  She has follow-up next month with CHF clinic.  Advised her to follow-up with me in about 8 weeks, sooner if needed.

## 2020-01-18 ENCOUNTER — Other Ambulatory Visit: Payer: Self-pay

## 2020-01-18 ENCOUNTER — Inpatient Hospital Stay: Payer: PPO

## 2020-01-18 VITALS — BP 136/79 | HR 72 | Temp 96.9°F | Resp 20

## 2020-01-18 DIAGNOSIS — E538 Deficiency of other specified B group vitamins: Secondary | ICD-10-CM | POA: Diagnosis not present

## 2020-01-18 DIAGNOSIS — D508 Other iron deficiency anemias: Secondary | ICD-10-CM

## 2020-01-18 MED ORDER — SODIUM CHLORIDE 0.9 % IV SOLN
510.0000 mg | Freq: Once | INTRAVENOUS | Status: AC
  Administered 2020-01-18: 510 mg via INTRAVENOUS
  Filled 2020-01-18: qty 510

## 2020-01-18 MED ORDER — CYANOCOBALAMIN 1000 MCG/ML IJ SOLN
1000.0000 ug | INTRAMUSCULAR | Status: DC
  Administered 2020-01-18: 1000 ug via INTRAMUSCULAR
  Filled 2020-01-18: qty 1

## 2020-01-18 MED ORDER — SODIUM CHLORIDE 0.9 % IV SOLN
Freq: Once | INTRAVENOUS | Status: AC
  Filled 2020-01-18: qty 250

## 2020-01-21 ENCOUNTER — Inpatient Hospital Stay: Payer: PPO

## 2020-01-25 ENCOUNTER — Inpatient Hospital Stay: Payer: PPO

## 2020-01-25 ENCOUNTER — Other Ambulatory Visit: Payer: Self-pay

## 2020-01-25 VITALS — BP 152/74 | HR 60 | Temp 96.2°F | Resp 16

## 2020-01-25 DIAGNOSIS — D508 Other iron deficiency anemias: Secondary | ICD-10-CM

## 2020-01-25 MED ORDER — SODIUM CHLORIDE 0.9 % IV SOLN
510.0000 mg | Freq: Once | INTRAVENOUS | Status: AC
  Administered 2020-01-25: 510 mg via INTRAVENOUS
  Filled 2020-01-25: qty 510

## 2020-01-25 MED ORDER — SODIUM CHLORIDE 0.9 % IV SOLN
Freq: Once | INTRAVENOUS | Status: AC
  Filled 2020-01-25: qty 250

## 2020-01-25 MED ORDER — CYANOCOBALAMIN 1000 MCG/ML IJ SOLN
1000.0000 ug | INTRAMUSCULAR | Status: DC
  Administered 2020-01-25: 1000 ug via INTRAMUSCULAR
  Filled 2020-01-25: qty 1

## 2020-02-07 ENCOUNTER — Telehealth: Payer: Self-pay

## 2020-02-07 NOTE — Telephone Encounter (Signed)
Patient is scheduled for appt

## 2020-02-07 NOTE — Telephone Encounter (Signed)
Noted  

## 2020-02-07 NOTE — Telephone Encounter (Signed)
I am ok to see patient on Wed. Will check her INR at that time. Please call her to schedule.

## 2020-02-07 NOTE — Telephone Encounter (Signed)
Bridgette spoke with Glenda Chroman FNP earlier and Glenda Chroman FNP wants to review note from access nurse prior to making appt with Glenda Chroman FNP; will also send note to [facility] for INR appt

## 2020-02-07 NOTE — Telephone Encounter (Signed)
Pt said she is coming for an INR tomorrow but she is having dizziness and wanted to see Debbie. Since she is having dizziness I sent her to access nurse to be triaged before scheduling.

## 2020-02-07 NOTE — Telephone Encounter (Addendum)
[facility] - Client TELEPHONE ADVICE RECORD AccessNurse Patient Name: [name] Gender: Female DOB: 10/16/1933 Age: 86 Y 10 M 29 D Return Phone Number: [phone] (Primary), [phone] (Secondary) Address: [place]/State/Zip: [place] [address] Client [facility] - Client Client Site [place] - Day Physician Tor Netters- NP Contact Type Call Who Is Calling Patient / Member / Family / Caregiver Call Type Triage / Clinical Relationship To Patient Self Return Phone Number [phone] (Primary) Chief Complaint Dizziness Reason for Call Symptomatic / Request for [place] says that she is dizzy Translation No Nurse Assessment Nurse: Zorita Pang, RN, Neoma Laming Date/Time (Eastern Time): 02/07/2020 12:36:15 PM Confirm and document reason for call. If symptomatic, describe symptoms. ---The caller states that she is having dizziness which usually goes away. Has hx of low hemoglobin. Has had 6 iron infusions recently. Does the patient have any new or worsening symptoms? ---Yes Will a triage be completed? ---Yes Related visit to physician within the last 2 weeks? ---No Does the PT have any chronic conditions? (i.e. diabetes, asthma, this includes High risk factors for pregnancy, etc.) ---Yes List chronic conditions. ---She was in the hospital last year for about 3 weeks. Had workup for bleeding and could not find any reason for the low hemoglobin. Is this a behavioral health or substance abuse call? ---No Guidelines Guideline Title Affirmed Question Affirmed Notes Nurse Date/Time (Eastern Time) Dizziness - Lightheadedness [1] MODERATE dizziness (e.g., interferes with normal activities) AND [2] has been evaluated by physician for this [facility], RN, Neoma Laming 02/07/2020 12:39:22 PM Disp. Time Eilene Ghazi Time) Disposition Final User 02/07/2020 12:48:53 PM  SEE PCP WITHIN 3 DAYS Yes [place], RN, Deborah PLEASE NOTE: All timestamps contained within this report are represented as [place] Standard Time. CONFIDENTIALTY NOTICE: This fax transmission is intended only for the addressee. It contains information that is legally privileged, confidential or otherwise protected from use or disclosure. If you are not the intended recipient, you are strictly prohibited from reviewing, disclosing, copying using or disseminating any of this information or taking any action in reliance on or regarding this information. If you have received this fax in error, please notify us immediately by telephone so that we can arrange for its return to [place]. Phone: [phone], Toll-Free: [phone], Fax: [phone] Page: 2 of 2 Call Id: [id number] [place] Disagree/Comply Comply Caller Understands Yes PreDisposition Call Doctor Care Advice Given Per Guideline SEE PCP WITHIN 3 DAYS: * Lie down with feet elevated for 1 hour. CALL BACK IF: * You become worse Comments User: Marquis Buggy, RN Date/Time Eilene Ghazi Time): 02/07/2020 12:52:58 PM The caller states that she has an INR test scheduled for tomorrow but she wants to see [name] Poling, NP on Wednesday and wants to know if she can have those done at the same time as she has to arrange transportation. Called the backline and no answer. Called the regular number and there was a hold time and 6 in front. Transferred the caller to wait and she verbalized understanding and that she would call back if she worsens in the meantime.

## 2020-02-08 ENCOUNTER — Ambulatory Visit: Payer: PPO

## 2020-02-09 ENCOUNTER — Other Ambulatory Visit: Payer: Self-pay

## 2020-02-09 ENCOUNTER — Encounter: Payer: Self-pay | Admitting: Family Medicine

## 2020-02-09 ENCOUNTER — Ambulatory Visit (INDEPENDENT_AMBULATORY_CARE_PROVIDER_SITE_OTHER): Payer: PPO | Admitting: Family Medicine

## 2020-02-09 ENCOUNTER — Ambulatory Visit (INDEPENDENT_AMBULATORY_CARE_PROVIDER_SITE_OTHER): Payer: PPO

## 2020-02-09 VITALS — BP 138/58 | HR 60 | Temp 97.4°F | Ht 66.5 in | Wt 143.5 lb

## 2020-02-09 DIAGNOSIS — D500 Iron deficiency anemia secondary to blood loss (chronic): Secondary | ICD-10-CM | POA: Diagnosis not present

## 2020-02-09 DIAGNOSIS — Z7901 Long term (current) use of anticoagulants: Secondary | ICD-10-CM

## 2020-02-09 DIAGNOSIS — N184 Chronic kidney disease, stage 4 (severe): Secondary | ICD-10-CM | POA: Diagnosis not present

## 2020-02-09 DIAGNOSIS — E039 Hypothyroidism, unspecified: Secondary | ICD-10-CM

## 2020-02-09 DIAGNOSIS — I4821 Permanent atrial fibrillation: Secondary | ICD-10-CM

## 2020-02-09 DIAGNOSIS — N183 Chronic kidney disease, stage 3 unspecified: Secondary | ICD-10-CM | POA: Diagnosis not present

## 2020-02-09 DIAGNOSIS — R42 Dizziness and giddiness: Secondary | ICD-10-CM

## 2020-02-09 LAB — POCT INR: INR: 2.3 (ref 2.0–3.0)

## 2020-02-09 NOTE — Progress Notes (Signed)
Subjective:    Patient ID: [name], female    DOB: [birth year], 86 y.o.   MRN: [id number]  HPI Chief Complaint  Patient presents with  . Dizziness    almost 2 weeks.  Ears stopped  . Headache    x 2weeks.  No headache currently   This is an 78 female, accompanied by her daughter, [name].   Weight at home 142.4. Eats small amounts several times a day. Drinking up to 60 ounces of water a day. Brain feels a little "cloudy." Ears feel stopped up. Dizziness has been off and on for months, occasionally spinning sensation, other times feels more lightheaded. Rarely feels like she is going to pass out. Symptoms with bending over, sometimes at rest, sometimes with activity. Not consistently with position change. Weight down considerably from typical dry weight.  She has upcoming appointment with advanced heart failure clinic next week.  Intermittent headache around sides of head, some at back. Annoying, doing better now. Resolves spontaneously.  Chronic sinus issues, on fluticasone year-round.  Ears feel stopped up occasionally.  Some tenderness on left foot where she has had wound.  Left-sided neck praying significantly improved.  Still is a little sore occasionally.  Per patient's daughter, the patient continues to sleep in a recliner which the patient's daughter does not think is good for the patient.  She started doing that when she had the wound of her left leg to keep it elevated.  Review of Systems    Per HPI Objective:   Physical Exam Vitals reviewed.  Constitutional:      General: She is not in acute distress.    Appearance: She is well-developed and normal weight. She is ill-appearing (chronically ill appearing). She is not toxic-appearing or diaphoretic.  HENT:     Head: Normocephalic and atraumatic.     Right Ear: Tympanic membrane, ear canal and external ear normal.     Left Ear: Tympanic membrane and external ear normal.     Ears:     Comments: Left canal slightly  narrowed.  Small amount of flaky cerumen. Neck:     Comments: Improved range of motion of neck. Cardiovascular:     Rate and Rhythm: Normal rate. Rhythm irregular.     Heart sounds: Murmur heard.   Musculoskeletal:     Cervical back: Normal range of motion and neck supple.  Skin:    General: Skin is warm and dry.     Findings: Lesion (approximately 2 mm lesion on left foot, improved. Suspect slightly open area from where scab fell off. No drainage or erythema. ) present.  Neurological:     Mental Status: She is alert and oriented to person, place, and time.  Psychiatric:        Mood and Affect: Mood normal.        Behavior: Behavior normal.        Thought Content: Thought content normal.        Judgment: Judgment normal.       BP (!) 138/58   Pulse 60   Temp (!) 97.4 F (36.3 C) (Temporal)   Ht 5' 6.5" (1.689 m)   Wt 143 lb 8 oz (65.1 kg)   SpO2 97%   BMI 22.81 kg/m  Wt Readings from Last 3 Encounters:  02/09/20 143 lb 8 oz (65.1 kg)  12/23/19 153 lb 11.2 oz (69.7 kg)  12/15/19 156 lb (70.8 kg)       Assessment & Plan:  1. Intermittent  lightheadedness -Unclear etiology.  She is currently receiving infusions for her iron deficiency anemia so I will not check those labs today. -We will try to get in touch with congestive heart failure clinic to find out her mems number.  2. Chronic kidney disease with active medical management without dialysis, stage 4 (severe) (HCC) - Basic Metabolic Panel  3. Acquired hypothyroidism - TSH  4. Permanent atrial fibrillation -Currently maintained on warfarin, she will have nurse visit following visit with me to check INR and manage dosing  5. Iron deficiency anemia due to chronic blood loss -She has upcoming follow-up including labs with hematology.  This visit occurred during the SARS-CoV-2 public health emergency.  Safety protocols were in place, including screening questions prior to the visit, additional usage of staff PPE,  and extensive cleaning of exam room while observing appropriate contact time as indicated for disinfecting solutions.      Clarene Reamer, FNP-BC  [facility] at [facility]  02/09/2020 6:20 PM

## 2020-02-09 NOTE — Patient Instructions (Addendum)
Pre visit review using our clinic review tool, if applicable. No additional management support is needed unless otherwise documented below in the visit note.  Continue 3mg  daily except take 2mg  on Wednesdays. Recheck in 4 wks.

## 2020-02-09 NOTE — Patient Instructions (Signed)
Good to see you today  I will call you with labs

## 2020-02-10 LAB — BASIC METABOLIC PANEL
BUN: 52 mg/dL — ABNORMAL HIGH (ref 6–23)
CO2: 37 mEq/L — ABNORMAL HIGH (ref 19–32)
Calcium: 9.9 mg/dL (ref 8.4–10.5)
Chloride: 92 mEq/L — ABNORMAL LOW (ref 96–112)
Creatinine, Ser: 1.83 mg/dL — ABNORMAL HIGH (ref 0.40–1.20)
GFR: 24.91 mL/min — ABNORMAL LOW (ref >60.00)
Glucose, Bld: 87 mg/dL (ref 70–99)
Potassium: 4 mEq/L (ref 3.5–5.1)
Sodium: 139 mEq/L (ref 135–145)

## 2020-02-10 LAB — TSH: TSH: 2.2 u[IU]/mL (ref 0.35–4.50)

## 2020-02-12 NOTE — Progress Notes (Signed)
I have reviewed this visit and I agree on the patient's plan of dosage and recommendations. Kainoa Swoboda B Wilmore Holsomback, FNP   

## 2020-02-14 ENCOUNTER — Telehealth (HOSPITAL_COMMUNITY): Payer: Self-pay | Admitting: Cardiology

## 2020-02-14 NOTE — Telephone Encounter (Signed)
Pt appt r/s until 10/20, per provider she was told to stop taking Metolazone until  Next appt. Pt wants to know if she should continue those orders to next appt on 10/20 Pt states she has been very dizzy. Please advise

## 2020-02-14 NOTE — Telephone Encounter (Signed)
Called pt to get more information. No answer/left vm requesting return call.

## 2020-02-15 ENCOUNTER — Encounter (HOSPITAL_COMMUNITY): Payer: PPO | Admitting: Cardiology

## 2020-02-16 NOTE — Telephone Encounter (Signed)
Pt left VM stating she was taken off metolazone until next appt but now has swelling and increased SOB. Pt appt was cancelled yesterday 10/12 and rescheduled to 10/20 due to provider not being in office. Pt wants to know if she needs to make any med changes before 10/20.   Routed to [place] for advice

## 2020-02-16 NOTE — Telephone Encounter (Signed)
Pt aware and verbalized understanding. Pt said she sent in her reading this morning as she does every morning.

## 2020-02-16 NOTE — Telephone Encounter (Signed)
Have her take metolazone 2.5 mg with am torsemide tomorrow, restart metolazone 2.5 mg once weekly.  She needs to send in a Cardiomems reading today to see how high she is.

## 2020-02-23 ENCOUNTER — Encounter (HOSPITAL_COMMUNITY): Payer: Self-pay | Admitting: Cardiology

## 2020-02-23 ENCOUNTER — Ambulatory Visit (HOSPITAL_COMMUNITY)
Admission: RE | Admit: 2020-02-23 | Discharge: 2020-02-23 | Disposition: A | Discharge status: Home / Self Care | Payer: PPO | Source: Ambulatory Visit | Attending: Cardiology | Admitting: Cardiology

## 2020-02-23 ENCOUNTER — Other Ambulatory Visit: Payer: Self-pay

## 2020-02-23 VITALS — Wt 151.0 lb

## 2020-02-23 DIAGNOSIS — N183 Chronic kidney disease, stage 3 unspecified: Secondary | ICD-10-CM | POA: Diagnosis not present

## 2020-02-23 DIAGNOSIS — Z87891 Personal history of nicotine dependence: Secondary | ICD-10-CM | POA: Insufficient documentation

## 2020-02-23 DIAGNOSIS — I428 Other cardiomyopathies: Secondary | ICD-10-CM | POA: Diagnosis not present

## 2020-02-23 DIAGNOSIS — Z7901 Long term (current) use of anticoagulants: Secondary | ICD-10-CM | POA: Diagnosis not present

## 2020-02-23 DIAGNOSIS — I6522 Occlusion and stenosis of left carotid artery: Secondary | ICD-10-CM | POA: Diagnosis not present

## 2020-02-23 DIAGNOSIS — Z79890 Hormone replacement therapy (postmenopausal): Secondary | ICD-10-CM | POA: Diagnosis not present

## 2020-02-23 DIAGNOSIS — I5042 Chronic combined systolic (congestive) and diastolic (congestive) heart failure: Secondary | ICD-10-CM | POA: Diagnosis not present

## 2020-02-23 DIAGNOSIS — I4821 Permanent atrial fibrillation: Secondary | ICD-10-CM | POA: Diagnosis not present

## 2020-02-23 DIAGNOSIS — Z85528 Personal history of other malignant neoplasm of kidney: Secondary | ICD-10-CM | POA: Diagnosis not present

## 2020-02-23 DIAGNOSIS — I2510 Atherosclerotic heart disease of native coronary artery without angina pectoris: Secondary | ICD-10-CM | POA: Insufficient documentation

## 2020-02-23 DIAGNOSIS — Z79899 Other long term (current) drug therapy: Secondary | ICD-10-CM | POA: Insufficient documentation

## 2020-02-23 DIAGNOSIS — I739 Peripheral vascular disease, unspecified: Secondary | ICD-10-CM | POA: Insufficient documentation

## 2020-02-23 DIAGNOSIS — D509 Iron deficiency anemia, unspecified: Secondary | ICD-10-CM | POA: Diagnosis not present

## 2020-02-23 DIAGNOSIS — Z905 Acquired absence of kidney: Secondary | ICD-10-CM | POA: Insufficient documentation

## 2020-02-23 DIAGNOSIS — I272 Pulmonary hypertension, unspecified: Secondary | ICD-10-CM | POA: Insufficient documentation

## 2020-02-23 DIAGNOSIS — I5022 Chronic systolic (congestive) heart failure: Secondary | ICD-10-CM | POA: Diagnosis not present

## 2020-02-23 DIAGNOSIS — Z8249 Family history of ischemic heart disease and other diseases of the circulatory system: Secondary | ICD-10-CM | POA: Insufficient documentation

## 2020-02-23 DIAGNOSIS — I5032 Chronic diastolic (congestive) heart failure: Secondary | ICD-10-CM | POA: Diagnosis not present

## 2020-02-23 DIAGNOSIS — J449 Chronic obstructive pulmonary disease, unspecified: Secondary | ICD-10-CM | POA: Insufficient documentation

## 2020-02-23 DIAGNOSIS — Z95810 Presence of automatic (implantable) cardiac defibrillator: Secondary | ICD-10-CM | POA: Insufficient documentation

## 2020-02-23 DIAGNOSIS — E785 Hyperlipidemia, unspecified: Secondary | ICD-10-CM | POA: Insufficient documentation

## 2020-02-23 DIAGNOSIS — I130 Hypertensive heart and chronic kidney disease with heart failure and stage 1 through stage 4 chronic kidney disease, or unspecified chronic kidney disease: Secondary | ICD-10-CM | POA: Diagnosis not present

## 2020-02-23 LAB — BASIC METABOLIC PANEL
Anion gap: 10 (ref 5–15)
BUN: 33 mg/dL — ABNORMAL HIGH (ref 8–23)
CO2: 30 mmol/L (ref 22–32)
Calcium: 9.5 mg/dL (ref 8.9–10.3)
Chloride: 99 mmol/L (ref 98–111)
Creatinine, Ser: 1.32 mg/dL — ABNORMAL HIGH (ref 0.44–1.00)
GFR, Estimated: 37 mL/min — ABNORMAL LOW (ref >60)
Glucose, Bld: 150 mg/dL — ABNORMAL HIGH (ref 70–99)
Potassium: 3.7 mmol/L (ref 3.5–5.1)
Sodium: 139 mmol/L (ref 135–145)

## 2020-02-23 MED ORDER — METOLAZONE 2.5 MG PO TABS
2.5000 mg | ORAL_TABLET | ORAL | 3 refills | Status: DC
Start: 2020-02-24 — End: 2020-06-21

## 2020-02-23 NOTE — Patient Instructions (Signed)
Take Metolazone 2.5mg  (1 tablet) on mondays and thursdays  Labs done today, your results will be available in MyChart, we will contact you for abnormal readings.  Your physician recommends that you return for labs in 2 weeks  Your physician recommends that you return for a follow up appointment with our APP clinic in 6 weeks  If you have any questions or concerns before your next appointment please send [place] a message through [place] or call our office at [phone].    TO LEAVE A MESSAGE FOR THE NURSE SELECT OPTION 2, PLEASE LEAVE A MESSAGE INCLUDING: . YOUR NAME . DATE OF BIRTH . CALL BACK NUMBER . REASON FOR CALL**this is important as we prioritize the call backs  [place] AS LONG AS YOU CALL BEFORE 4:00 PM  At the [facility], you and your health needs are our priority. As part of our continuing mission to provide you with exceptional heart care, we have created designated Provider Care Teams. These Care Teams include your primary Cardiologist (physician) and Advanced Practice Providers (APPs- Physician Assistants and Nurse Practitioners) who all work together to provide you with the care you need, when you need it.   You may see any of the following providers on your designated Care Team at your next follow up: Marland Kitchen Dr Glori Bickers . Dr Loralie Champagne . Darrick Grinder, NP . Lyda Jester, PA . Audry Riles, PharmD   Please be sure to bring in all your medications bottles to every appointment.

## 2020-02-23 NOTE — Progress Notes (Signed)
Orthostatics done as ordered,Dr. [name] aware of  results.

## 2020-02-24 NOTE — Progress Notes (Signed)
PCP: Elby Beck, FNP Cardiology: Dr. Rockey Situ HF Cardiology: Dr. [name]  86 y.o. with history of permanent atrial fibrillation, chronic diastolic CHF, CKD stage 3, smoking/prior COPD, renal cell CA s/p right nephrectomy.  She has a prior history of HFrEF with nonischemic cardiomyoapthy and had a Medtronic ICD placed.  However, subsequently her EF has recovered.  More recently, she has had symptomatic HFpEF.    She has had dyspnea and edema for "a long time," worse starting 1/20. In 6/20, she had left hip ORIF after mechanical fall.  Echo in 6/20 showed EF 60-65%, mild RV dilation with normal systolic function, PASP 93 mmHg.  After she got home from the hospital stay post-ORIF, she noted marked peripheral edema.  She was short of breath walking short distances.  +orthopnea.  She came back to the hospital on 11/04/18 and was admitted with volume overload.  She was diuresed aggressively and lost about 22 lbs.  RHC showed pulmonary venous hypertension.  PCWP tracing had prominent v-waves in absence of significant MR, suggesting significant diastolic dysfunction.   She has been noted to have significant Fe deficiency anemia, but FOBT was negative during 7/20 hospitalization.   She was admitted in 11/20 with symptomatic anemia, GI workup concerning for ischemic bowel.  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 Doren Custard with VVS and conservative management recommended.  Capsule endoscopy showed no definite source of bleeding.  She was volume overloaded due to significant RV dysfunction and diuresed, she also developed AKI on CKD stage 3.      She returns for followup of CHF.  Breathing is stable, short of breath walking longer distances or up inclines.  No orthopnea/PND.  No chest pain.  She is "dizzy all the time."  Further questioning suggests that this seems to be more of a balance issue than lightheadedness, orthostatics done today were negative.  Weight is up 6 lbs.  Recently,  we had cut back on her metolazone from 3 times a week to currently once a week.     Labs (7/20): K 3.7, creatinine 1.44 => 1.33 Labs (12/20): pro-BNP 431, K 4.4, creatinine 1.24, hgb 8.2 Labs (10/21): K 4, creatinine 1.83  Medtronic device interrogation: Fluid index < threshold, no AF, no VT.   Cardiomems PADP 20  PMH: 1. COPD 2. Renal cell carcinoma: S/p right kidney resection.  3. CKD stage 3 4. Fe deficiency anemia 5. Atrial fibrillation: Permanent. She has failed amiodarone, Tikosyn, and flecainide as well as multiple cardioversions. 6. Chronic diastolic CHF: She has history of prior HFrEF with nonischemic cardiomyopathy.  She has a Medtronic ICD.   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  - More recently, EF has been in normal range but she has had diastolic CHF.  - Echo (1/01): EF 60-65%, mildly dilated RV with PASP 93 mmHg, mild-moderate TR.  - RHC (7/20): mean RA 11, PA 64/18 mean 39, mean PCWP 24 with v waves to 41, CI 4.21, PVR 1.9 WU.  - Cardiomems placement - RHC (11/20): mean RA 13, PA 63/23 mean 25 with v waves to 47 (MR only mild by echo), mean PCWP 25, CI 3.18, PVR 2.6 WU 7. Hyperlipidemia 8. Carotid stenosis: Carotid dopplers (7/51) with 02-58% LICA stenosis.   9. Possible ischemic bowel in 11/20 10. 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 Conservative management per Dr. Doren Custard.   Social History   Socioeconomic History  . Marital status: Widowed  Spouse name: Not on file  . Number of children: Not on file  . Years of education: Not on file  . Highest education level: Not on file  Occupational History  . Occupation: Retired    Fish farm manager: RETIRED  Tobacco Use  . Smoking status: Former Smoker    Packs/day: 0.50    Years: 40.00    Pack years: 20.00    Types: Cigarettes    Quit date: 05/06/2001    Years since quitting: 18.8  . Smokeless tobacco: Never Used  Vaping Use  . Vaping Use: Never used  Substance and Sexual  Activity  . Alcohol use: No    Alcohol/week: 0.0 standard drinks  . Drug use: No  . Sexual activity: Never  Other Topics Concern  . 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   ALCOHOL USE-NO   RETIRED   FORMER TOBACCO USE.....1/2 PACK X 38 YRS UNTIL 2003   Would desire CPR   Social Determinants of Health   Financial Resource Strain:   . Difficulty of Paying Living Expenses: Not on file  Food Insecurity:   . Worried About Charity fundraiser in the Last Year: Not on file  . Ran Out of Food in the Last Year: Not on file  Transportation Needs:   . Lack of Transportation (Medical): Not on file  . Lack of Transportation (Non-Medical): Not on file  Physical Activity:   . Days of Exercise per Week: Not on file  . Minutes of Exercise per Session: Not on file  Stress:   . Feeling of Stress : Not on file  Social Connections:   . Frequency of Communication with Friends and Family: Not on file  . Frequency of Social Gatherings with Friends and Family: Not on file  . Attends Religious Services: Not on file  . Active Member of Clubs or Organizations: Not on file  . Attends Archivist Meetings: Not on file  . Marital Status: Not on file  Intimate Partner Violence:   . Fear of Current or Ex-Partner: Not on file  . Emotionally Abused: Not on file  . Physically Abused: Not on file  . Sexually Abused: Not on file   Family History  Problem Relation Age of Onset  . Heart failure Mother   . Breast cancer Maternal Aunt 82  . Breast cancer Cousin   . Breast cancer Other   . Colon cancer Neg Hx    ROS: All systems reviewed and negative except as per HPI.   Current Outpatient Medications  Medication Sig Dispense Refill  . acetaminophen (TYLENOL) 500 MG tablet Take 500-1,000 mg by mouth every 8 (eight) hours as needed  for mild pain or headache.     . cetirizine (ZYRTEC) 10 MG tablet Take 10 mg by mouth daily.     . Cholecalciferol (VITAMIN D) 50 MCG (2000 UT) CAPS Take 4,000 Units by mouth daily.    Marland Kitchen conjugated estrogens (PREMARIN) vaginal cream Place 1 Applicatorful vaginally daily. 42.5 g 12  . Cranberry (AZO CRANBERRY GUMMIES) 500 MG CHEW Chew 2,000 mg by mouth daily.    Marland Kitchen ezetimibe (ZETIA) 10 MG tablet Take 1 tablet (10 mg total) by mouth daily. 30 tablet 11  . ferrous sulfate 325 (65 FE) MG tablet Take 1 tablet (325 mg  total) by mouth 2 (two) times daily with a meal. 60 tablet 0  . fluticasone (FLONASE) 50 MCG/ACT nasal spray Place 2 sprays into both nostrils daily as needed for allergies or rhinitis. 16 g 6  . levothyroxine (SYNTHROID) 25 MCG tablet TAKE 1 TABLET BY MOUTH ONCE DAILY BEFOREBREAKFAST 30 tablet 3  . magnesium oxide (MAG-OX) 400 MG tablet Take 1 tablet (400 mg total) by mouth daily. 30 tablet 2  . metolazone (ZAROXOLYN) 2.5 MG tablet Take 1 tablet (2.5 mg total) by mouth 2 (two) times a week. On mondays and thursdays 8 tablet 3  . potassium chloride SA (KLOR-CON) 20 MEQ tablet Take 2 tablets (40 mEq total) by mouth 2 (two) times daily. 1 extra tab on metolazone days 90 tablet 3  . rosuvastatin (CRESTOR) 40 MG tablet Take 1 tablet (40 mg total) by mouth daily. 90 tablet 3  . torsemide (DEMADEX) 100 MG tablet Take 1 tablet (100 mg total) by mouth 2 (two) times daily. 180 tablet 3  . vitamin C (ASCORBIC ACID) 250 MG tablet Take 500 mg by mouth daily.    Marland Kitchen warfarin (COUMADIN) 2 MG tablet TAKE 1 TABLETS BY MOUTH DAILY AT 6PM AS DIRECTED BY ANTICOAGULATION CLINIC. 90 tablet 1   No current facility-administered medications for this encounter.   Wt 68.5 kg (151 lb)   SpO2 96%   BMI 24.01 kg/m  General: NAD Neck: JVP 8 cm, no thyromegaly or thyroid nodule.  Lungs: Clear to auscultation bilaterally with normal respiratory effort. CV: Nondisplaced PMI.  Heart irregular S1/S2, no S3/S4, no  murmur. Trace ankle edema.  No carotid bruit.  Normal pedal pulses.  Abdomen: Soft, nontender, no hepatosplenomegaly, no distention.  Skin: Intact without lesions or rashes.  Neurologic: Alert and oriented x 3.  Psych: Normal affect. Extremities: No clubbing or cyanosis.  HEENT: Normal.   Assessment/Plan: 1. Chronic diastolic CHF: Echo in 4/92 showed EF 60-65%, mild RV dilation with normal systolic function, PASP 93 mmHg. She had remote nonischemic cardiomyopathy with recovery of EF, has Medtronic ICD. I suspect that there is significant RV dysfunction. Atrial fibrillation likely contributes, but this appears to be permanent now as she has failed multiple anti-arrhythmics and is reasonably rate-controlled. [place] 11/20 showed elevated filling pressures and pulmonary venous hypertension.  Prominent v-waves on PCWP tracing likely due to stiff ventricle/diastolic dysfunction as she had only mild MR on echo.  She now has Cardiomems.  Volume status is up by Cardiomems (mildly) and weight is up. - Continue torsemide 80 mg bid. I will have her go back to taking metolazone biw on Monday and Thursday.  BMET today and again in 2 wks.     - Repeat echo.  2. Pulmonary hypertension: PA systolic pressure estimated 93 mmHg on last echo. Based on [place] 11/20, she has primarily pulmonary venous hypertension.  3. CKD: Stage 3. BMET today and again in 10 days.    4. Anemia: Fe deficiency. She denies overt bleeding.  Capsule endoscopy during 11/20 admission did not show bleeding source.  5. Atrial fibrillation: Permanent now. Rate control is reasonable. She has failed amiodarone, Tikosyn, and flecainide as well as multiple cardioversions.  - Continue warfarin.   She will followup with NP/PA in 6 weeks to reassess volume status.   Loralie Champagne 02/24/2020

## 2020-02-29 ENCOUNTER — Other Ambulatory Visit: Payer: Self-pay | Admitting: Family Medicine

## 2020-02-29 ENCOUNTER — Telehealth: Payer: Self-pay | Admitting: Oncology

## 2020-02-29 DIAGNOSIS — Z7901 Long term (current) use of anticoagulants: Secondary | ICD-10-CM

## 2020-02-29 NOTE — Telephone Encounter (Signed)
02/29/2020  Called informing pt that their appts on 03/10/20 have been moved to 11/9, with labs on 11/8, per Dr. Janese Banks. Left call back number and informed them to reach out if they had any questions or concerns  SRW

## 2020-03-01 NOTE — Telephone Encounter (Signed)
Pt called back checking on her prescription for Warfarin(Coumadin) she is running out. Please advise

## 2020-03-03 ENCOUNTER — Other Ambulatory Visit: Payer: PPO

## 2020-03-06 ENCOUNTER — Other Ambulatory Visit (INDEPENDENT_AMBULATORY_CARE_PROVIDER_SITE_OTHER): Payer: PPO

## 2020-03-06 ENCOUNTER — Ambulatory Visit: Payer: PPO

## 2020-03-06 ENCOUNTER — Telehealth: Payer: PPO | Admitting: Oncology

## 2020-03-06 ENCOUNTER — Other Ambulatory Visit: Payer: Self-pay

## 2020-03-06 ENCOUNTER — Ambulatory Visit (INDEPENDENT_AMBULATORY_CARE_PROVIDER_SITE_OTHER): Payer: PPO | Admitting: Family Medicine

## 2020-03-06 ENCOUNTER — Encounter: Payer: Self-pay | Admitting: Family Medicine

## 2020-03-06 VITALS — BP 150/54 | HR 61 | Temp 97.3°F | Ht 66.5 in | Wt 148.0 lb

## 2020-03-06 DIAGNOSIS — R42 Dizziness and giddiness: Secondary | ICD-10-CM | POA: Diagnosis not present

## 2020-03-06 DIAGNOSIS — I5033 Acute on chronic diastolic (congestive) heart failure: Secondary | ICD-10-CM | POA: Diagnosis not present

## 2020-03-06 DIAGNOSIS — I5032 Chronic diastolic (congestive) heart failure: Secondary | ICD-10-CM | POA: Diagnosis not present

## 2020-03-06 DIAGNOSIS — Z7901 Long term (current) use of anticoagulants: Secondary | ICD-10-CM

## 2020-03-06 DIAGNOSIS — Z23 Encounter for immunization: Secondary | ICD-10-CM

## 2020-03-06 DIAGNOSIS — E538 Deficiency of other specified B group vitamins: Secondary | ICD-10-CM

## 2020-03-06 DIAGNOSIS — I4821 Permanent atrial fibrillation: Secondary | ICD-10-CM

## 2020-03-06 DIAGNOSIS — L03116 Cellulitis of left lower limb: Secondary | ICD-10-CM

## 2020-03-06 DIAGNOSIS — D649 Anemia, unspecified: Secondary | ICD-10-CM

## 2020-03-06 DIAGNOSIS — D508 Other iron deficiency anemias: Secondary | ICD-10-CM

## 2020-03-06 LAB — PROTIME-INR
INR: 2.8 ratio — ABNORMAL HIGH (ref 0.8–1.0)
Prothrombin Time: 30.8 s — ABNORMAL HIGH (ref 9.6–13.1)

## 2020-03-06 LAB — POCT INR: INR: 2.8 (ref 2.0–3.0)

## 2020-03-06 NOTE — Patient Instructions (Signed)
Good to see you today  Have a wonderful birthday!!

## 2020-03-06 NOTE — Progress Notes (Signed)
Subjective:    Patient ID: [name], female    DOB: 03/22/1934, 86 y.o.   MRN: [id number]  HPI Chief Complaint  Patient presents with   Follow-up    cellulitis left legs   This is an 86 yo female who presents today for follow up of lightheadedness, leg cellulitis, CHF.  Her 20 year old grandson continues to live with her.  She helped to raise him.  He is going to [employer] as well as Personal assistant.  He is a big help to her.  Lightheadedness- Overall, lightheadedness has been significantly improved.  She did have a little episode yesterday.  She is unable to identify any triggers.   CHF-continues to use compression socks, monitoring her weight. Taking Metolozone twice a week, Monday/ Thursday.  Recent visit with cardiology, needs follow-up bmet. No cough, chest pain, shortness of breath is stable.  She paces herself for activities.  Cellulitis lower extremities-has had progressive healing, is able to wear her compression socks.  Open wound has closed.  Review of Systems Per HPI    Objective:   Physical Exam Vitals reviewed.  Constitutional:      General: She is not in acute distress.    Appearance: Normal appearance. She is normal weight. She is not ill-appearing, toxic-appearing or diaphoretic.  HENT:     Head: Normocephalic and atraumatic.  Cardiovascular:     Rate and Rhythm: Normal rate. Rhythm irregular.     Heart sounds: Murmur heard.   Pulmonary:     Effort: Pulmonary effort is normal.     Breath sounds: Normal breath sounds.  Musculoskeletal:     Right lower leg: No edema.     Left lower leg: No edema.  Skin:    General: Skin is warm and dry.  Neurological:     Mental Status: She is alert and oriented to person, place, and time.  Psychiatric:        Mood and Affect: Mood normal.        Behavior: Behavior normal.        Thought Content: Thought content normal.        Judgment: Judgment normal.       BP (!) 150/54    Pulse  61    Temp (!) 97.3 F (36.3 C) (Temporal)    Ht 5' 6.5" (1.689 m)    Wt 148 lb (67.1 kg)    SpO2 92%    BMI 23.53 kg/m  Wt Readings from Last 3 Encounters:  03/06/20 148 lb (67.1 kg)  02/23/20 151 lb (68.5 kg)  02/09/20 143 lb 8 oz (65.1 kg)   BP Readings from Last 3 Encounters:  03/06/20 (!) 150/54  02/09/20 (!) 138/58  01/25/20 (!) 152/74        Assessment & Plan:  1. Long term (current) use of anticoagulants - INR today  2. Cellulitis of left leg -Resolved, continue to monitor.  Wear compression socks, elevate legs when sitting.  Follow-up if any recurrence.  3. Chronic diastolic CHF (congestive heart failure) (HCC) -Weight is stable, she is checking daily and taking medications as ordered.  She since CardioMEMS transmissions daily. -Continue follow-up with CHF clinic -I have asked her hematologist to get a be met next week with regular lab draw  4. Need for influenza vaccination - Flu Vaccine QUAD High Dose(Fluad)  5. Intermittent lightheadedness -Has never been able to determine etiology, is improved.  Continue to monitor.  -Follow-up in 3 to 4 months  This visit occurred during the SARS-CoV-2 public health emergency.  Safety protocols were in place, including screening questions prior to the visit, additional usage of staff PPE, and extensive cleaning of exam room while observing appropriate contact time as indicated for disinfecting solutions.      Clarene Reamer, FNP-BC  [facility] at [facility]  03/06/2020 1:05 PM

## 2020-03-07 ENCOUNTER — Ambulatory Visit (INDEPENDENT_AMBULATORY_CARE_PROVIDER_SITE_OTHER): Payer: PPO

## 2020-03-07 DIAGNOSIS — Z7901 Long term (current) use of anticoagulants: Secondary | ICD-10-CM

## 2020-03-07 NOTE — Patient Instructions (Addendum)
Pre visit review using our clinic review tool, if applicable. No additional management support is needed unless otherwise documented below in the visit note.  Continue 3mg  daily except take 2mg  on Wednesdays. Recheck in 4 wks.

## 2020-03-08 ENCOUNTER — Other Ambulatory Visit: Payer: PPO

## 2020-03-08 ENCOUNTER — Ambulatory Visit: Payer: PPO | Admitting: Family Medicine

## 2020-03-10 ENCOUNTER — Telehealth: Payer: PPO | Admitting: Oncology

## 2020-03-13 ENCOUNTER — Inpatient Hospital Stay: Payer: PPO

## 2020-03-14 ENCOUNTER — Inpatient Hospital Stay: Payer: PPO | Admitting: Oncology

## 2020-03-20 ENCOUNTER — Telehealth: Payer: Self-pay | Admitting: *Deleted

## 2020-03-20 ENCOUNTER — Other Ambulatory Visit: Payer: Self-pay

## 2020-03-20 ENCOUNTER — Other Ambulatory Visit: Payer: Self-pay | Admitting: *Deleted

## 2020-03-20 ENCOUNTER — Inpatient Hospital Stay: Payer: PPO | Attending: Oncology

## 2020-03-20 DIAGNOSIS — N189 Chronic kidney disease, unspecified: Secondary | ICD-10-CM | POA: Diagnosis not present

## 2020-03-20 DIAGNOSIS — D631 Anemia in chronic kidney disease: Secondary | ICD-10-CM | POA: Insufficient documentation

## 2020-03-20 DIAGNOSIS — D508 Other iron deficiency anemias: Secondary | ICD-10-CM

## 2020-03-20 DIAGNOSIS — E538 Deficiency of other specified B group vitamins: Secondary | ICD-10-CM

## 2020-03-20 LAB — CBC
HCT: 26.4 % — ABNORMAL LOW (ref 36.0–46.0)
Hemoglobin: 8.3 g/dL — ABNORMAL LOW (ref 12.0–15.0)
MCH: 27.1 pg (ref 26.0–34.0)
MCHC: 31.4 g/dL (ref 30.0–36.0)
MCV: 86.3 fL (ref 80.0–100.0)
Platelets: 229 10*3/uL (ref 150–400)
RBC: 3.06 MIL/uL — ABNORMAL LOW (ref 3.87–5.11)
RDW: 19.8 % — ABNORMAL HIGH (ref 11.5–15.5)
WBC: 4.3 10*3/uL (ref 4.0–10.5)
nRBC: 0 % (ref 0.0–0.2)

## 2020-03-20 LAB — IRON AND TIBC
Iron: 39 ug/dL (ref 28–170)
Saturation Ratios: 11 % (ref 10.4–31.8)
TIBC: 353 ug/dL (ref 250–450)
UIBC: 314 ug/dL

## 2020-03-20 LAB — VITAMIN B12: Vitamin B-12: 335 pg/mL (ref 180–914)

## 2020-03-20 LAB — FERRITIN: Ferritin: 25 ng/mL (ref 11–307)

## 2020-03-20 NOTE — Telephone Encounter (Signed)
Noted  

## 2020-03-20 NOTE — Telephone Encounter (Signed)
Patient called to schedule an appointment and was transferred to triage because of dizziness. Patient stated that she has been dizzy since last week. Patient stated that she feels that her iron is real low and has a history of having infusions. Patient stated that she was scheduled for lab work at the [facility] last week and rescheduled it to next week because she was too dizzy to drive.. Patient checked her blood pressure while on the phone and it was 110/47 and heart rate 60. Patient stated that her mouth if real dry and may be dehydrated.  After speaking with Tor Netters NP patient was advised that she needs to call the [facility] and see about moving her lab appointment up to ASAP. Patient stated that she has a granddaughter that may be able to drive her. Patient was advised that she also needs to call Cardiology and talk to a nurse to find out what her cardiac MEMS number are. Patient was advised to drink 24 ounces of water now. Patient verbalized understanding all of her instructions. Patient was given 911/ER precautions and she verbalized understanding.

## 2020-03-21 ENCOUNTER — Telehealth: Payer: Self-pay | Admitting: *Deleted

## 2020-03-21 ENCOUNTER — Other Ambulatory Visit: Payer: Self-pay | Admitting: Oncology

## 2020-03-21 ENCOUNTER — Telehealth (HOSPITAL_COMMUNITY): Payer: Self-pay | Admitting: *Deleted

## 2020-03-21 NOTE — Telephone Encounter (Signed)
Pt's family called concerned about she is feeling, they state she had labs done yesterday and is going to the cancer center on Thursday but wanted to make sure her fluid level wasn't contributing to her feeling poorly. Pt has no energy and just doesn't feel well. Advised pt's cardiomems reading is normal at 16 today, no indication for fluid accumulation. Advised to keep appt w/[facility] and if feeling worse to call them back or report to ER. They are thankful for information

## 2020-03-21 NOTE — Telephone Encounter (Signed)
Adonis Brook called back and patient agrees to come into office Thursday for a face to face visit with Dr Janese Banks, She is still deciding if she wants to go to the ER or not. Please contact Adonis Brook with appointment time for THursday appointment.

## 2020-03-21 NOTE — Telephone Encounter (Signed)
After speaking with Dr Janese Banks, I called granddaughter Adonis Brook back and advised that we will not transfuse patient with hgb of 8.3, and that Dr Janese Banks had wanted to see patient in person to discuss Retacrit and Iron for anemia but patient told her that she does not have transportation. Dr Janese Banks has offered to see patient Thursday if she can come in. She was also advised that if patient feels that she is going to pass out that she needs to be evaluated in ER for possible other causes such as cardiac. Adonis Brook is going to call patient to discuss this with patient and call me back

## 2020-03-21 NOTE — Telephone Encounter (Signed)
Granddaughter Adonis Brook called reporting that patient had emergent lab drawn yesterday and that they have not heard back regarding results, but that patient has a video visit with Dr Janese Banks Friday. She reports that patient is not doing well at all; she feels like she is going to pass out, very dizzy and weak like when she needs blood. Her hgb is 8.3 and she states she thinks her hgb wa 8 the last time she was given blood I told her that if she feels that patient needs blood today that she would need to take her to the ER due to the time of day, but she wanted me to check with Dr Janese Banks to see if there is anything that she can suggest before approaching patient about ER. Please advise

## 2020-03-21 NOTE — Telephone Encounter (Signed)
Retacrit order is in

## 2020-03-23 ENCOUNTER — Other Ambulatory Visit: Payer: Self-pay

## 2020-03-23 ENCOUNTER — Inpatient Hospital Stay: Payer: PPO

## 2020-03-23 ENCOUNTER — Inpatient Hospital Stay (HOSPITAL_BASED_OUTPATIENT_CLINIC_OR_DEPARTMENT_OTHER): Payer: PPO | Admitting: Oncology

## 2020-03-23 ENCOUNTER — Telehealth: Payer: Self-pay

## 2020-03-23 ENCOUNTER — Encounter: Payer: Self-pay | Admitting: Oncology

## 2020-03-23 VITALS — BP 110/43 | HR 58 | Temp 97.9°F | Resp 16 | Ht 66.5 in | Wt 145.0 lb

## 2020-03-23 DIAGNOSIS — D508 Other iron deficiency anemias: Secondary | ICD-10-CM

## 2020-03-23 DIAGNOSIS — E538 Deficiency of other specified B group vitamins: Secondary | ICD-10-CM

## 2020-03-23 DIAGNOSIS — D631 Anemia in chronic kidney disease: Secondary | ICD-10-CM

## 2020-03-23 DIAGNOSIS — D649 Anemia, unspecified: Secondary | ICD-10-CM | POA: Diagnosis not present

## 2020-03-23 DIAGNOSIS — N189 Chronic kidney disease, unspecified: Secondary | ICD-10-CM | POA: Diagnosis not present

## 2020-03-23 DIAGNOSIS — Z79899 Other long term (current) drug therapy: Secondary | ICD-10-CM | POA: Diagnosis not present

## 2020-03-23 MED ORDER — SODIUM CHLORIDE 0.9% FLUSH
3.0000 mL | Freq: Once | INTRAVENOUS | Status: DC | PRN
  Filled 2020-03-23: qty 3

## 2020-03-23 MED ORDER — ALTEPLASE 2 MG IJ SOLR
2.0000 mg | Freq: Once | INTRAMUSCULAR | Status: DC | PRN
  Filled 2020-03-23: qty 2

## 2020-03-23 MED ORDER — HEPARIN SOD (PORK) LOCK FLUSH 100 UNIT/ML IV SOLN
250.0000 [IU] | Freq: Once | INTRAVENOUS | Status: DC | PRN
  Filled 2020-03-23: qty 5

## 2020-03-23 MED ORDER — HEPARIN SOD (PORK) LOCK FLUSH 100 UNIT/ML IV SOLN
500.0000 [IU] | Freq: Once | INTRAVENOUS | Status: DC | PRN
  Filled 2020-03-23: qty 5

## 2020-03-23 MED ORDER — EPOETIN ALFA-EPBX 40000 UNIT/ML IJ SOLN
40000.0000 [IU] | INTRAMUSCULAR | Status: DC
  Administered 2020-03-23: 40000 [IU] via SUBCUTANEOUS
  Filled 2020-03-23: qty 1

## 2020-03-23 MED ORDER — SODIUM CHLORIDE 0.9% FLUSH
10.0000 mL | Freq: Once | INTRAVENOUS | Status: DC | PRN
  Filled 2020-03-23: qty 10

## 2020-03-23 NOTE — Progress Notes (Addendum)
Chronic Care Management Pharmacy Assistant   Name: [name]  MRN: [id number] DOB: 1933-11-14  Reason for Encounter:  Hypertension Disease State Review  PCP : Elby Beck, FNP  Allergies:   Allergies  Allergen Reactions   Dilaudid [Hydromorphone] Other (See Comments)    Excessive Somnolence- Required Narcan   Fentanyl Other (See Comments)    Required Narcan when given with Dilaudid. Tolerated single dose when given during procedure 02/03/2019.    Codeine Nausea And Vomiting    Hallucinations    Morphine And Related Nausea And Vomiting    Hallucinations    Sulfonamide Derivatives Other (See Comments)    Dry mouth    Medications: Outpatient Encounter Medications as of 03/23/2020  Medication Sig Note   acetaminophen (TYLENOL) 500 MG tablet Take 500-1,000 mg by mouth every 8 (eight) hours as needed for mild pain or headache.     cetirizine (ZYRTEC) 10 MG tablet Take 10 mg by mouth daily.     Cholecalciferol (VITAMIN D) 50 MCG (2000 UT) CAPS Take 4,000 Units by mouth daily.    conjugated estrogens (PREMARIN) vaginal cream Place 1 Applicatorful vaginally daily.    Cranberry (AZO CRANBERRY GUMMIES) 500 MG CHEW Chew 2,000 mg by mouth daily.    Cyanocobalamin (B-12 PO) Take by mouth daily. 03/23/2020: Unknown dose   ezetimibe (ZETIA) 10 MG tablet Take 1 tablet (10 mg total) by mouth daily.    ferrous sulfate 325 (65 FE) MG tablet Take 1 tablet (325 mg total) by mouth 2 (two) times daily with a meal.    fluticasone (FLONASE) 50 MCG/ACT nasal spray Place 2 sprays into both nostrils daily as needed for allergies or rhinitis.    levothyroxine (SYNTHROID) 25 MCG tablet TAKE 1 TABLET BY MOUTH ONCE DAILY BEFOREBREAKFAST    magnesium oxide (MAG-OX) 400 MG tablet Take 1 tablet (400 mg total) by mouth daily.    metolazone (ZAROXOLYN) 2.5 MG tablet Take 1 tablet (2.5 mg total) by mouth 2 (two) times a week. On mondays and thursdays    potassium chloride SA (KLOR-CON) 20 MEQ  tablet Take 2 tablets (40 mEq total) by mouth 2 (two) times daily. 1 extra tab on metolazone days    rosuvastatin (CRESTOR) 40 MG tablet Take 1 tablet (40 mg total) by mouth daily.    torsemide (DEMADEX) 100 MG tablet Take 1 tablet (100 mg total) by mouth 2 (two) times daily.    vitamin C (ASCORBIC ACID) 250 MG tablet Take 500 mg by mouth daily.    warfarin (COUMADIN) 2 MG tablet TAKE 1 TABLETS BY MOUTH DAILY AT 6PM AS DIRECTED BY ANTICOAGULATION CLINIC. 03/23/2020: 1 mg daly for 6 days and then on Sunday 2 mg dose   Facility-Administered Encounter Medications as of 03/23/2020  Medication   alteplase (CATHFLO ACTIVASE) injection 2 mg   epoetin alfa-epbx (RETACRIT) injection 40,000 Units   heparin lock flush 100 unit/mL   heparin lock flush 100 unit/mL   sodium chloride flush (NS) 0.9 % injection 10 mL   sodium chloride flush (NS) 0.9 % injection 3 mL    Current Diagnosis: Patient Active Problem List   Diagnosis Date Noted   Iron deficiency anemia 01/06/2020   Cellulitis of left leg 11/11/2019   History of GI bleed 11/11/2019   Chronic venous hypertension w ulceration ([place])    Melena    AKI (acute kidney injury) ([place]) 03/03/2019   Symptomatic anemia 02/25/2019   Acute on chronic diastolic CHF (congestive heart failure), NYHA  class 4 ([place]) 11/04/2018   Acute blood loss anemia 11/04/2018   Chronic bilateral pleural effusions 11/04/2018   Shortness of breath 10/21/2018   Displaced fracture of left femoral neck (H[facility]) 10/09/2018   Somnolence    Closed left hip fracture ([place]) 10/07/2018   Fall    Hypokalemia    Afib ([place]) 06/01/2018   Urinary frequency 09/18/2017   Iliac artery stenosis, bilateral ([place]) 07/14/2017   Recurrent UTI 07/14/2017   Long term (current) use of anticoagulants 05/08/2017   Anemia 11/18/2016   Dysuria 11/18/2016   Pulmonary hypertension ([place]) 12/04/2015   B12 deficiency 05/22/2015   PAD (peripheral artery disease) (HCC)    Elevated serum creatinine     Renal cell carcinoma (HCC)    History of nephrectomy secondary to [place]    TIA (transient ischemic attack) 08/16/2014   Weakness 01/14/2014   Chronic kidney disease (CKD), stage IV (severe) (HCC) 12/08/2013   Preoperative cardiovascular examination 06/07/2013   Chronic diastolic CHF (congestive heart failure) ([place]) 06/04/2013   Mitral valve regurgitation 06/04/2013   Allergic rhinitis 01/01/2013   Chronic cholecystitis with calculus 09/15/2012   Claudication ([place]) 01/21/2012   Intermittent lightheadedness 11/26/2011   Hypertension 05/20/2011   Avascular necrosis of hip ([place]) 05/03/2011   OTHER AND UNSPECIFIED COAGULATION DEFECTS 10/13/2009   RECTAL BLEEDING 10/13/2009   HOCM / IHSS 07/14/2009   EDEMA 07/14/2009   Hyperlipidemia 04/10/2009   ADJUSTMENT DISORDER WITH ANXIETY 04/10/2009   INCONTINENCE, URGE 04/10/2009   Non-ischemic cardiomyopathy ([place]) 10/27/2008   Permanent atrial fibrillation 10/27/2008   SINUS BRADYCARDIA 10/27/2008   Carotid artery stenosis 10/27/2008   COPD (chronic obstructive pulmonary disease) ([place]) 10/27/2008   CKD (chronic kidney disease) stage 3, GFR 30-59 ml/min ([place]) 10/27/2008   ICD (implantable cardioverter-defibrillator) in place 10/27/2008    Follow-Up:  Pharmacist Review   Reviewed chart prior to disease state call. Spoke with patient regarding blood pressure.  Recent Office Vitals: BP Readings from Last 3 Encounters:  03/23/20 (!) 110/43  03/06/20 (!) 150/54  02/09/20 (!) 138/58   Pulse Readings from Last 3 Encounters:  03/23/20 (!) 58  03/06/20 61  02/09/20 60    Wt Readings from Last 3 Encounters:  03/23/20 145 lb (65.8 kg)  03/06/20 148 lb (67.1 kg)  02/23/20 151 lb (68.5 kg)     Kidney Function Lab Results  Component Value Date/Time   CREATININE 1.32 (H) 02/23/2020 04:11 PM   CREATININE 1.83 (H) 02/09/2020 05:14 PM   CREATININE 1.34 (H) 03/19/2011 09:08 AM   GFR 24.91 (L) 02/09/2020 05:14 PM   GFRNONAA 37 (L) 02/23/2020  04:11 PM   GFRAA 39 (L) 12/23/2019 02:58 PM    BMP Latest Ref Rng & Units 02/23/2020 02/09/2020 12/23/2019  Glucose 70 - 99 mg/dL 150(H) 87 95  BUN 8 - 23 mg/dL 33(H) 52(H) 45(H)  Creatinine 0.44 - 1.00 mg/dL 1.32(H) 1.83(H) 1.45(H)  BUN/Creat Ratio - - - -  Sodium 135 - 145 mmol/L 139 139 139  Potassium 3.5 - 5.1 mmol/L 3.7 4.0 4.1  Chloride 98 - 111 mmol/L 99 92(L) 97(L)  CO2 22 - 32 mmol/L 30 37(H) 32  Calcium 8.9 - 10.3 mg/dL 9.5 9.9 9.2   Current antihypertensive regimen:  Torsemide 20 mg - 5 tablets (100 mg) twice daily Metolazone 5 mg - Tues, Thurs, Sat Potassium chloride SA 20 mEq - 2 tablets BID, 1 extra with metolazone  How often are you checking your Blood Pressure? when feeling symptomatic  Current home BP readings:  Patient states her blood pressure has been ranging around 115-130/51-60.  What recent interventions/DTPs have been made by any provider to improve Blood Pressure control since last CPP Visit: None   Any recent hospitalizations or ED visits since last visit with CPP? No  What diet changes have been made to improve Blood Pressure Control?  Patient states she cooks at home without using salt. Eats fish, chicken, and vegetables.   What exercise is being done to improve your Blood Pressure Control?  Patient reports she walks around her house.  Ask patient if she would like to schedule a follow up with her CPP. Patient states "no I don't need it".  Adherence Review: Is the patient currently on ACE/ARB medication? No Is the patient currently on a STATIN medication? Yes Patient confirms adherence to rosuvastatin daily.  [place] Pharmacist Assistant [phone]   I have reviewed the care management and care coordination activities outlined in this encounter and I am certifying that I agree with the content of this note. No intervention necessary. Patient will be dis-enrolled from CCM services per patient request to discontinue follow up  - effective 04/04/2020. Always happy to help if any concerns arise.  Debbora Dus, PharmD Clinical Pharmacist [facility] [phone]

## 2020-03-24 ENCOUNTER — Inpatient Hospital Stay: Payer: PPO | Admitting: Oncology

## 2020-03-26 ENCOUNTER — Emergency Department (HOSPITAL_COMMUNITY): Payer: PPO

## 2020-03-26 ENCOUNTER — Other Ambulatory Visit: Payer: Self-pay

## 2020-03-26 ENCOUNTER — Inpatient Hospital Stay (HOSPITAL_COMMUNITY)
Admission: EM | Admit: 2020-03-26 | Discharge: 2020-03-31 | DRG: 377 | Disposition: A | Discharge status: Home Health | Payer: PPO | Attending: Internal Medicine | Admitting: Internal Medicine

## 2020-03-26 DIAGNOSIS — I130 Hypertensive heart and chronic kidney disease with heart failure and stage 1 through stage 4 chronic kidney disease, or unspecified chronic kidney disease: Secondary | ICD-10-CM | POA: Diagnosis present

## 2020-03-26 DIAGNOSIS — I428 Other cardiomyopathies: Secondary | ICD-10-CM | POA: Diagnosis present

## 2020-03-26 DIAGNOSIS — E039 Hypothyroidism, unspecified: Secondary | ICD-10-CM | POA: Diagnosis present

## 2020-03-26 DIAGNOSIS — J449 Chronic obstructive pulmonary disease, unspecified: Secondary | ICD-10-CM | POA: Diagnosis present

## 2020-03-26 DIAGNOSIS — Z96643 Presence of artificial hip joint, bilateral: Secondary | ICD-10-CM | POA: Diagnosis present

## 2020-03-26 DIAGNOSIS — D649 Anemia, unspecified: Secondary | ICD-10-CM | POA: Diagnosis present

## 2020-03-26 DIAGNOSIS — K551 Chronic vascular disorders of intestine: Secondary | ICD-10-CM | POA: Diagnosis present

## 2020-03-26 DIAGNOSIS — Z87891 Personal history of nicotine dependence: Secondary | ICD-10-CM

## 2020-03-26 DIAGNOSIS — Z888 Allergy status to other drugs, medicaments and biological substances status: Secondary | ICD-10-CM

## 2020-03-26 DIAGNOSIS — Z87440 Personal history of urinary (tract) infections: Secondary | ICD-10-CM

## 2020-03-26 DIAGNOSIS — Z882 Allergy status to sulfonamides status: Secondary | ICD-10-CM | POA: Diagnosis not present

## 2020-03-26 DIAGNOSIS — Z7189 Other specified counseling: Secondary | ICD-10-CM | POA: Diagnosis not present

## 2020-03-26 DIAGNOSIS — K922 Gastrointestinal hemorrhage, unspecified: Secondary | ICD-10-CM | POA: Diagnosis present

## 2020-03-26 DIAGNOSIS — N189 Chronic kidney disease, unspecified: Secondary | ICD-10-CM

## 2020-03-26 DIAGNOSIS — N1832 Chronic kidney disease, stage 3b: Secondary | ICD-10-CM

## 2020-03-26 DIAGNOSIS — D631 Anemia in chronic kidney disease: Secondary | ICD-10-CM | POA: Diagnosis present

## 2020-03-26 DIAGNOSIS — Z885 Allergy status to narcotic agent status: Secondary | ICD-10-CM | POA: Diagnosis not present

## 2020-03-26 DIAGNOSIS — F419 Anxiety disorder, unspecified: Secondary | ICD-10-CM | POA: Diagnosis not present

## 2020-03-26 DIAGNOSIS — I4821 Permanent atrial fibrillation: Secondary | ICD-10-CM | POA: Diagnosis present

## 2020-03-26 DIAGNOSIS — N179 Acute kidney failure, unspecified: Secondary | ICD-10-CM

## 2020-03-26 DIAGNOSIS — I739 Peripheral vascular disease, unspecified: Secondary | ICD-10-CM | POA: Diagnosis present

## 2020-03-26 DIAGNOSIS — Z8249 Family history of ischemic heart disease and other diseases of the circulatory system: Secondary | ICD-10-CM

## 2020-03-26 DIAGNOSIS — E785 Hyperlipidemia, unspecified: Secondary | ICD-10-CM

## 2020-03-26 DIAGNOSIS — R404 Transient alteration of awareness: Secondary | ICD-10-CM | POA: Diagnosis not present

## 2020-03-26 DIAGNOSIS — Z7901 Long term (current) use of anticoagulants: Secondary | ICD-10-CM | POA: Diagnosis not present

## 2020-03-26 DIAGNOSIS — Z85528 Personal history of other malignant neoplasm of kidney: Secondary | ICD-10-CM

## 2020-03-26 DIAGNOSIS — K5521 Angiodysplasia of colon with hemorrhage: Secondary | ICD-10-CM | POA: Diagnosis present

## 2020-03-26 DIAGNOSIS — Z20822 Contact with and (suspected) exposure to covid-19: Secondary | ICD-10-CM | POA: Diagnosis present

## 2020-03-26 DIAGNOSIS — Z79890 Hormone replacement therapy (postmenopausal): Secondary | ICD-10-CM

## 2020-03-26 DIAGNOSIS — Z95810 Presence of automatic (implantable) cardiac defibrillator: Secondary | ICD-10-CM

## 2020-03-26 DIAGNOSIS — Z905 Acquired absence of kidney: Secondary | ICD-10-CM

## 2020-03-26 DIAGNOSIS — F4322 Adjustment disorder with anxiety: Secondary | ICD-10-CM | POA: Diagnosis present

## 2020-03-26 DIAGNOSIS — Z66 Do not resuscitate: Secondary | ICD-10-CM | POA: Diagnosis not present

## 2020-03-26 DIAGNOSIS — N183 Chronic kidney disease, stage 3 unspecified: Secondary | ICD-10-CM | POA: Diagnosis not present

## 2020-03-26 DIAGNOSIS — I340 Nonrheumatic mitral (valve) insufficiency: Secondary | ICD-10-CM | POA: Diagnosis not present

## 2020-03-26 DIAGNOSIS — I351 Nonrheumatic aortic (valve) insufficiency: Secondary | ICD-10-CM | POA: Diagnosis not present

## 2020-03-26 DIAGNOSIS — Z79899 Other long term (current) drug therapy: Secondary | ICD-10-CM

## 2020-03-26 DIAGNOSIS — I2510 Atherosclerotic heart disease of native coronary artery without angina pectoris: Secondary | ICD-10-CM | POA: Diagnosis present

## 2020-03-26 DIAGNOSIS — R4020 Unspecified coma: Secondary | ICD-10-CM | POA: Diagnosis not present

## 2020-03-26 DIAGNOSIS — E782 Mixed hyperlipidemia: Secondary | ICD-10-CM

## 2020-03-26 DIAGNOSIS — I5033 Acute on chronic diastolic (congestive) heart failure: Secondary | ICD-10-CM | POA: Diagnosis present

## 2020-03-26 DIAGNOSIS — I6782 Cerebral ischemia: Secondary | ICD-10-CM | POA: Diagnosis not present

## 2020-03-26 DIAGNOSIS — R200 Anesthesia of skin: Secondary | ICD-10-CM | POA: Diagnosis not present

## 2020-03-26 DIAGNOSIS — I272 Pulmonary hypertension, unspecified: Secondary | ICD-10-CM | POA: Diagnosis present

## 2020-03-26 DIAGNOSIS — K2960 Other gastritis without bleeding: Secondary | ICD-10-CM | POA: Diagnosis present

## 2020-03-26 DIAGNOSIS — Z515 Encounter for palliative care: Secondary | ICD-10-CM

## 2020-03-26 DIAGNOSIS — R791 Abnormal coagulation profile: Secondary | ICD-10-CM | POA: Diagnosis present

## 2020-03-26 DIAGNOSIS — W19XXXA Unspecified fall, initial encounter: Secondary | ICD-10-CM | POA: Diagnosis not present

## 2020-03-26 DIAGNOSIS — R079 Chest pain, unspecified: Secondary | ICD-10-CM | POA: Diagnosis not present

## 2020-03-26 DIAGNOSIS — Z8673 Personal history of transient ischemic attack (TIA), and cerebral infarction without residual deficits: Secondary | ICD-10-CM

## 2020-03-26 DIAGNOSIS — Z803 Family history of malignant neoplasm of breast: Secondary | ICD-10-CM

## 2020-03-26 DIAGNOSIS — I350 Nonrheumatic aortic (valve) stenosis: Secondary | ICD-10-CM | POA: Diagnosis not present

## 2020-03-26 DIAGNOSIS — R54 Age-related physical debility: Secondary | ICD-10-CM | POA: Diagnosis present

## 2020-03-26 DIAGNOSIS — R195 Other fecal abnormalities: Secondary | ICD-10-CM | POA: Diagnosis not present

## 2020-03-26 DIAGNOSIS — D62 Acute posthemorrhagic anemia: Secondary | ICD-10-CM | POA: Diagnosis present

## 2020-03-26 DIAGNOSIS — D509 Iron deficiency anemia, unspecified: Secondary | ICD-10-CM | POA: Diagnosis not present

## 2020-03-26 DIAGNOSIS — T8089XA Other complications following infusion, transfusion and therapeutic injection, initial encounter: Secondary | ICD-10-CM | POA: Diagnosis not present

## 2020-03-26 DIAGNOSIS — K5520 Angiodysplasia of colon without hemorrhage: Secondary | ICD-10-CM

## 2020-03-26 DIAGNOSIS — K5730 Diverticulosis of large intestine without perforation or abscess without bleeding: Secondary | ICD-10-CM | POA: Diagnosis present

## 2020-03-26 DIAGNOSIS — I361 Nonrheumatic tricuspid (valve) insufficiency: Secondary | ICD-10-CM | POA: Diagnosis not present

## 2020-03-26 DIAGNOSIS — K31811 Angiodysplasia of stomach and duodenum with bleeding: Secondary | ICD-10-CM | POA: Diagnosis present

## 2020-03-26 DIAGNOSIS — K31819 Angiodysplasia of stomach and duodenum without bleeding: Secondary | ICD-10-CM | POA: Diagnosis not present

## 2020-03-26 DIAGNOSIS — J439 Emphysema, unspecified: Secondary | ICD-10-CM | POA: Diagnosis not present

## 2020-03-26 DIAGNOSIS — R55 Syncope and collapse: Secondary | ICD-10-CM | POA: Diagnosis not present

## 2020-03-26 DIAGNOSIS — K317 Polyp of stomach and duodenum: Secondary | ICD-10-CM | POA: Diagnosis present

## 2020-03-26 DIAGNOSIS — M79606 Pain in leg, unspecified: Secondary | ICD-10-CM | POA: Diagnosis not present

## 2020-03-26 DIAGNOSIS — J90 Pleural effusion, not elsewhere classified: Secondary | ICD-10-CM | POA: Diagnosis not present

## 2020-03-26 NOTE — ED Triage Notes (Signed)
Pt to ED with c/o mid chest tightness, and weakness.  Pt had hx of low iron.  Pt's Hgb 3 days ago was 8.3  Pt st's when she stands up she feels like she is gong to pass out.  Pt alert and oriented.  Color pale

## 2020-03-26 NOTE — Progress Notes (Signed)
Hematology/Oncology Consult note [name]  [phone] [phone]  Patient Care Team: Elby Beck, FNP as PCP - General (Nurse Practitioner) Deboraha Sprang, MD as PCP - Cardiology (Cardiology) Larey Dresser, MD as PCP - Advanced Heart Failure (Cardiology) Minna Merritts, MD as Consulting Physician (Cardiology) Deboraha Sprang, MD as Consulting Physician (Cardiology) [name], Dolores Lory, MD as Consulting Physician (Vascular Surgery) Debbora Dus, [facility] as Pharmacist (Pharmacist)   Name of the patient: [name]  [id number]  07/26/1933   Date of visit: 03/26/20  Diagnosis-normocytic anemia possibly secondary to anemia of chronic kidney disease and anemia of chronic disease  Chief complaint/ Reason for visit-routine follow-up of anemia  Heme/Onc history: Patient is a 86 year old female with a past medical history significant for hypothyroidism, TIA, stage IV CKD, A. fib, congestive heart failure, COPD, AICD placement among other medical problems. She has been referred to me for anemia. Her most recent CBC from 12/15/2019 showed white count of 4, H&H of 8.9/27.6 with an MCV of 82 and a platelet count of 225. Looking back at her prior CBCs her hemoglobin was between 9-10 until July 2021 although looking back at her CBC for most of the last year her hemoglobin has been between 6-8. She has also received Feraheme by cardiology in the past in June 2021. She has had a complete GI work-up including EGD colonoscopy and capsule study by lab our GI in October 2020. Colonoscopy showed patchy moderate inflammation in the sigmoid colon possibly ischemic and chronic but may explain the anemia. Upper endoscopy showed mild gastritis but was otherwise normal. Capsule endoscopy was negative.   Interval history-patient reports significant fatigue which has worsened over the last few weeks.  She has not felt better despite receiving IV  iron.  She also follows up with cardiology and gets CardioMEMS monitored which decide about whether she has fluid accumulation or not.  ECOG PS- 2 Pain scale- 0   Review of systems- Review of Systems  Constitutional: Positive for malaise/fatigue. Negative for chills, fever and weight loss.  HENT: Negative for congestion, ear discharge and nosebleeds.   Eyes: Negative for blurred vision.  Respiratory: Negative for cough, hemoptysis, sputum production, shortness of breath and wheezing.   Cardiovascular: Negative for chest pain, palpitations, orthopnea and claudication.  Gastrointestinal: Negative for abdominal pain, blood in stool, constipation, diarrhea, heartburn, melena, nausea and vomiting.  Genitourinary: Negative for dysuria, flank pain, frequency, hematuria and urgency.  Musculoskeletal: Negative for back pain, joint pain and myalgias.  Skin: Negative for rash.  Neurological: Negative for dizziness, tingling, focal weakness, seizures, weakness and headaches.  Endo/Heme/Allergies: Does not bruise/bleed easily.  Psychiatric/Behavioral: Negative for depression and suicidal ideas. The patient does not have insomnia.       Allergies  Allergen Reactions  . Dilaudid [Hydromorphone] Other (See Comments)    Excessive Somnolence- Required Narcan  . Fentanyl Other (See Comments)    Required Narcan when given with Dilaudid. Tolerated single dose when given during procedure 02/03/2019.   Marland Kitchen Codeine Nausea And Vomiting    Hallucinations   . Morphine And Related Nausea And Vomiting    Hallucinations   . Sulfonamide Derivatives Other (See Comments)    Dry mouth     Past Medical History:  Diagnosis Date  . Adjustment disorder with anxiety   . Anemia   . Anginal pain ([place])   . Arthritis    "some in my hands" (11/11/2012)  . Atrial fibrillation ([place])   .  Automatic implantable cardioverter-defibrillator in situ   . Blood in stool   . Carotid artery stenosis 09/2007   a. 09/2007: 60-79%  bilateral (stable); b. 10/2008: 40-59% R 60-79%   . Chest pain, unspecified   . Chronic airway obstruction, not elsewhere classified   . Chronic bronchitis ([place])    "get it some; not q year" (11/11/2012)  . Chronic diastolic CHF (congestive heart failure) ([place]) 06/04/2013  . Chronic kidney disease, unspecified   . Coronary artery disease    non-obstructive by 2006 cath  . Frequent UTI    "get them a couple times/yr" (11/11/2012)  . Hemorrhage of rectum and anus   . High cholesterol   . History of blood transfusion 04/2011   "after hip OR" (11/11/2012)  . Hx of renal cell cancer   . Hypertension 05/20/2011  . Hypertr obst cardiomyop   . Hypotension, unspecified    cardiac cath 2006..nonobstructive CAD 30-40s lesions.Marland KitchenETT 1/09 nondiagnostic due to poor HR response..Right Renal Cancer 2003  . Long term (current) use of anticoagulants   . Malignant neoplasm of kidney, except pelvis   . Osteoarthritis of right hip   . Other and unspecified coagulation defects   . PONV (postoperative nausea and vomiting)   . Renal cancer ([place]) 06/2001   Right  . Secondary cardiomyopathy, unspecified   . Sinus bradycardia   . Urge incontinence      Past Surgical History:  Procedure Laterality Date  . ABDOMINAL HYSTERECTOMY  1975   for benign causes  . APPENDECTOMY    . BI-VENTRICULAR PACEMAKER UPGRADE  05/04/2010  . BIOPSY  02/28/2019   Procedure: BIOPSY;  Surgeon: Milus Banister, MD;  Location: [name] ENDOSCOPY;  Service: Endoscopy;;  . CARDIAC CATHETERIZATION  2006  . CARDIOVERSION N/A 02/20/2018   Procedure: CARDIOVERSION;  Surgeon: Minna Merritts, MD;  Location: [place] ORS;  Service: Cardiovascular;  Laterality: N/A;  . CARDIOVERSION N/A 03/27/2018   Procedure: CARDIOVERSION;  Surgeon: Minna Merritts, MD;  Location: [facility] ORS;  Service: Cardiovascular;  Laterality: N/A;  . CATARACT EXTRACTION W/ INTRAOCULAR LENS  IMPLANT, BILATERAL  01/2006-02-2006  . CHOLECYSTECTOMY N/A 11/11/2012   Procedure:  LAPAROSCOPIC CHOLECYSTECTOMY WITH INTRAOPERATIVE CHOLANGIOGRAM;  Surgeon: Imogene Burn. Georgette Dover, MD;  Location: [place];  Service: General;  Laterality: N/A;  . COLONOSCOPY WITH PROPOFOL N/A 02/28/2019   Procedure: COLONOSCOPY WITH PROPOFOL;  Surgeon: Milus Banister, MD;  Location: [facility] ENDOSCOPY;  Service: Endoscopy;  Laterality: N/A;  . EP IMPLANTABLE DEVICE N/A 02/21/2016   Procedure: ICD Generator Changeout;  Surgeon: Deboraha Sprang, MD;  Location: [place] CV LAB;  Service: Cardiovascular;  Laterality: N/A;  . ESOPHAGOGASTRODUODENOSCOPY (EGD) WITH PROPOFOL N/A 02/28/2019   Procedure: ESOPHAGOGASTRODUODENOSCOPY (EGD) WITH PROPOFOL;  Surgeon: Milus Banister, MD;  Location[facility] ENDOSCOPY;  Service: Endoscopy;  Laterality: N/A;  . GIVENS CAPSULE STUDY N/A 03/15/2019   Procedure: GIVENS CAPSULE STUDY;  Surgeon: Thornton Park, MD;  Location: [place];  Service: Gastroenterology;  Laterality: N/A;  . INSERT / REPLACE / REMOVE PACEMAKER  05-01-11   02-28-05-/05-04-10-ICD-MEDTRONIC MAXIMAL DR  . JOINT REPLACEMENT    . LAPAROSCOPIC CHOLECYSTECTOMY  11/11/2012  . LAPAROSCOPIC LYSIS OF ADHESIONS N/A 11/11/2012   Procedure: LAPAROSCOPIC LYSIS OF ADHESIONS;  Surgeon: Imogene Burn. Georgette Dover, MD;  Location: [place];  Service: General;  Laterality: N/A;  . NEPHRECTOMY Right 06/2001    S/P RENAL CELL CANCER  . PRESSURE SENSOR/CARDIOMEMS N/A 02/03/2019   Procedure: PRESSURE SENSOR/CARDIOMEMS;  Surgeon: Larey Dresser, MD;  Location: [facility]  INVASIVE CV LAB;  Service: Cardiovascular;  Laterality: N/A;  . RIGHT HEART CATH N/A 11/09/2018   Procedure: RIGHT HEART CATH;  Surgeon: Larey Dresser, MD;  Location: [place] CV LAB;  Service: Cardiovascular;  Laterality: N/A;  . RIGHT HEART CATH N/A 03/08/2019   Procedure: RIGHT HEART CATH;  Surgeon: Larey Dresser, MD;  Location: [place] CV LAB;  Service: Cardiovascular;  Laterality: N/A;  . SUBMUCOSAL TATTOO INJECTION  02/28/2019   Procedure: SUBMUCOSAL TATTOO  INJECTION;  Surgeon: Milus Banister, MD;  Location: [facility] ENDOSCOPY;  Service: Endoscopy;;  . TOTAL HIP ARTHROPLASTY Right 05/03/2011   Procedure: TOTAL HIP ARTHROPLASTY ANTERIOR APPROACH;  Surgeon: Mcarthur Rossetti;  Location: [facility] ORS;  Service: Orthopedics;  Laterality: Right;  Removal of Cannulated Screws Right Hip, Right Direct Anterior Hip Replacement  . TOTAL HIP ARTHROPLASTY Left 10/09/2018   Procedure: TOTAL HIP ARTHROPLASTY ANTERIOR APPROACH;  Surgeon: Rod Can, MD;  Location: [place];  Service: Orthopedics;  Laterality: Left;    Social History   Socioeconomic History  . Marital status: Widowed    Spouse name: Not on file  . Number of children: Not on file  . Years of education: Not on file  . Highest education level: Not on file  Occupational History  . Occupation: Retired    Fish farm manager: RETIRED  Tobacco Use  . Smoking status: Former Smoker    Packs/day: 0.50    Years: 40.00    Pack years: 20.00    Types: Cigarettes    Quit date: 05/06/2001    Years since quitting: 18.9  . Smokeless tobacco: Never Used  Vaping Use  . Vaping Use: Never used  Substance and Sexual Activity  . Alcohol use: No    Alcohol/week: 0.0 standard drinks  . Drug use: No  . Sexual activity: Never  Other Topics Concern  . 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   ALCOHOL USE-NO   RETIRED   FORMER TOBACCO USE.....1/2 PACK X 38 YRS UNTIL 2003   Would desire CPR   Social Determinants of Health   Financial Resource Strain:   . Difficulty of Paying Living Expenses: Not on file  Food Insecurity:   . Worried About Charity fundraiser in the Last Year: Not on file  . Ran Out of Food in the Last Year: Not on file  Transportation Needs:   . Lack of Transportation (Medical): Not on file  . Lack of Transportation (Non-Medical): Not  on file  Physical Activity:   . Days of Exercise per Week: Not on file  . Minutes of Exercise per Session: Not on file  Stress:   . Feeling of Stress : Not on file  Social Connections:   . Frequency of Communication with Friends and Family: Not on file  . Frequency of Social Gatherings with Friends and Family: Not on file  . Attends Religious Services: Not on file  . Active Member of Clubs or Organizations: Not on file  . Attends Archivist Meetings: Not on file  . Marital Status: Not on file  Intimate Partner Violence:   . Fear of Current or Ex-Partner: Not on file  . Emotionally Abused: Not on file  . Physically Abused: Not on file  . Sexually Abused: Not on file  Family History  Problem Relation Age of Onset  . Heart failure Mother   . Breast cancer Maternal Aunt 82  . Breast cancer Cousin   . Breast cancer Other   . Colon cancer Neg Hx      Current Outpatient Medications:  .  acetaminophen (TYLENOL) 500 MG tablet, Take 500-1,000 mg by mouth every 8 (eight) hours as needed for mild pain or headache. , Disp: , Rfl:  .  cetirizine (ZYRTEC) 10 MG tablet, Take 10 mg by mouth daily. , Disp: , Rfl:  .  Cholecalciferol (VITAMIN D) 50 MCG (2000 UT) CAPS, Take 4,000 Units by mouth daily., Disp: , Rfl:  .  conjugated estrogens (PREMARIN) vaginal cream, Place 1 Applicatorful vaginally daily., Disp: 42.5 g, Rfl: 12 .  Cranberry (AZO CRANBERRY GUMMIES) 500 MG CHEW, Chew 2,000 mg by mouth daily., Disp: , Rfl:  .  Cyanocobalamin (B-12 PO), Take by mouth daily., Disp: , Rfl:  .  ezetimibe (ZETIA) 10 MG tablet, Take 1 tablet (10 mg total) by mouth daily., Disp: 30 tablet, Rfl: 11 .  ferrous sulfate 325 (65 FE) MG tablet, Take 1 tablet (325 mg total) by mouth 2 (two) times daily with a meal., Disp: 60 tablet, Rfl: 0 .  fluticasone (FLONASE) 50 MCG/ACT nasal spray, Place 2 sprays into both nostrils daily as needed for allergies or rhinitis., Disp: 16 g, Rfl: 6 .  levothyroxine  (SYNTHROID) 25 MCG tablet, TAKE 1 TABLET BY MOUTH ONCE DAILY BEFOREBREAKFAST, Disp: 30 tablet, Rfl: 3 .  magnesium oxide (MAG-OX) 400 MG tablet, Take 1 tablet (400 mg total) by mouth daily., Disp: 30 tablet, Rfl: 2 .  metolazone (ZAROXOLYN) 2.5 MG tablet, Take 1 tablet (2.5 mg total) by mouth 2 (two) times a week. On mondays and thursdays, Disp: 8 tablet, Rfl: 3 .  potassium chloride SA (KLOR-CON) 20 MEQ tablet, Take 2 tablets (40 mEq total) by mouth 2 (two) times daily. 1 extra tab on metolazone days, Disp: 90 tablet, Rfl: 3 .  rosuvastatin (CRESTOR) 40 MG tablet, Take 1 tablet (40 mg total) by mouth daily., Disp: 90 tablet, Rfl: 3 .  torsemide (DEMADEX) 100 MG tablet, Take 1 tablet (100 mg total) by mouth 2 (two) times daily., Disp: 180 tablet, Rfl: 3 .  vitamin C (ASCORBIC ACID) 250 MG tablet, Take 500 mg by mouth daily., Disp: , Rfl:  .  warfarin (COUMADIN) 2 MG tablet, TAKE 1 TABLETS BY MOUTH DAILY AT 6PM AS DIRECTED BY ANTICOAGULATION CLINIC., Disp: 90 tablet, Rfl: 1 No current facility-administered medications for this visit.  Facility-Administered Medications Ordered in Other Visits:  .  alteplase (CATHFLO ACTIVASE) injection 2 mg, 2 mg, Intracatheter, Once PRN, Sindy Guadeloupe, MD .  epoetin alfa-epbx (RETACRIT) injection 40,000 Units, 40,000 Units, Subcutaneous, Q30 days, Sindy Guadeloupe, MD, 40,000 Units at 03/23/20 1201 .  heparin lock flush 100 unit/mL, 500 Units, Intracatheter, Once PRN, Sindy Guadeloupe, MD .  heparin lock flush 100 unit/mL, 250 Units, Intracatheter, Once PRN, Sindy Guadeloupe, MD .  sodium chloride flush (NS) 0.9 % injection 10 mL, 10 mL, Intracatheter, Once PRN, Sindy Guadeloupe, MD .  sodium chloride flush (NS) 0.9 % injection 3 mL, 3 mL, Intracatheter, Once PRN, Sindy Guadeloupe, MD  Physical exam:  Vitals:   03/23/20 1127 03/23/20 1139  BP:  (!) 110/43  Pulse:  (!) 58  Resp:  16  Temp: 97.9 F (36.6 C) 97.9 F (36.6 C)  TempSrc: Oral   Weight: 145  lb (65.8 kg)     Height: 5' 6.5" (1.689 m)    Physical Exam Constitutional:      Comments: Thin elderly female who appears fatigued  Eyes:     Pupils: Pupils are equal, round, and reactive to light.  Cardiovascular:     Rate and Rhythm: Normal rate and regular rhythm.     Heart sounds: Normal heart sounds.  Pulmonary:     Effort: Pulmonary effort is normal.     Breath sounds: Normal breath sounds.  Abdominal:     General: Bowel sounds are normal.     Palpations: Abdomen is soft.  Skin:    General: Skin is warm and dry.  Neurological:     Mental Status: She is alert and oriented to person, place, and time.      CMP Latest Ref Rng & Units 02/23/2020  Glucose 70 - 99 mg/dL 150(H)  BUN 8 - 23 mg/dL 33(H)  Creatinine 0.44 - 1.00 mg/dL 1.32(H)  Sodium 135 - 145 mmol/L 139  Potassium 3.5 - 5.1 mmol/L 3.7  Chloride 98 - 111 mmol/L 99  CO2 22 - 32 mmol/L 30  Calcium 8.9 - 10.3 mg/dL 9.5  Total Protein 6.5 - 8.1 g/dL -  Total Bilirubin 0.3 - 1.2 mg/dL -  Alkaline Phos 38 - 126 U/L -  AST 15 - 41 U/L -  ALT 0 - 44 U/L -   CBC Latest Ref Rng & Units 03/20/2020  WBC 4.0 - 10.5 K/uL 4.3  Hemoglobin 12.0 - 15.0 g/dL 8.3(L)  Hematocrit 36 - 46 % 26.4(L)  Platelets 150 - 400 K/uL 229    No images are attached to the encounter.  No results found.   Assessment and plan- Patient is a 86 y.o. female with normocytic anemia likely secondary to chronic kidney disease and iron deficiency  Patient has received multiple doses of IV iron over the last 1 year which have not resulted in significant improvement of her ferritin also her hemoglobin is currently around 8 and over the last 1 year it has drifted up and down between 8-10.  She received IV iron about 6 weeks ago and her ferritin levels are still low at 25.  Iron studies are normal.  B12 is currently normal at 335.  She does follow-up with nephrology for CKD and I will consider starting Retacrit at this time 40,000 units every 3 weeks to keep her  hemoglobin closer to 10.  Discussed risks and benefits of Retacrit including all but not limited to possible risk of thromboembolic events.  Plan is to hold Retacrit if hemoglobin is greater than 11.  We will also plan to give her IV iron over the next 2 to 3 weeks.  I will see her back in 6 weeks with CBC with differential, CMP for possible Retacrit   Visit Diagnosis 1. Other iron deficiency anemia   2. B12 deficiency   3. Erythropoietin (EPO) stimulating agent anemia management patient   4. Anemia in chronic kidney disease, unspecified CKD stage      Dr. Randa Evens, MD, MPH [facility] [phone] 03/26/2020 11:47 AM

## 2020-03-27 ENCOUNTER — Observation Stay (HOSPITAL_COMMUNITY): Payer: PPO

## 2020-03-27 ENCOUNTER — Other Ambulatory Visit: Payer: PPO

## 2020-03-27 ENCOUNTER — Emergency Department (HOSPITAL_COMMUNITY): Payer: PPO

## 2020-03-27 ENCOUNTER — Other Ambulatory Visit: Payer: Self-pay

## 2020-03-27 DIAGNOSIS — I350 Nonrheumatic aortic (valve) stenosis: Secondary | ICD-10-CM

## 2020-03-27 DIAGNOSIS — D649 Anemia, unspecified: Secondary | ICD-10-CM | POA: Diagnosis not present

## 2020-03-27 DIAGNOSIS — E039 Hypothyroidism, unspecified: Secondary | ICD-10-CM | POA: Diagnosis not present

## 2020-03-27 DIAGNOSIS — N1832 Chronic kidney disease, stage 3b: Secondary | ICD-10-CM | POA: Diagnosis not present

## 2020-03-27 DIAGNOSIS — I340 Nonrheumatic mitral (valve) insufficiency: Secondary | ICD-10-CM

## 2020-03-27 DIAGNOSIS — Z7189 Other specified counseling: Secondary | ICD-10-CM

## 2020-03-27 DIAGNOSIS — I351 Nonrheumatic aortic (valve) insufficiency: Secondary | ICD-10-CM

## 2020-03-27 DIAGNOSIS — R791 Abnormal coagulation profile: Secondary | ICD-10-CM | POA: Diagnosis present

## 2020-03-27 DIAGNOSIS — J439 Emphysema, unspecified: Secondary | ICD-10-CM | POA: Diagnosis not present

## 2020-03-27 DIAGNOSIS — Z515 Encounter for palliative care: Secondary | ICD-10-CM

## 2020-03-27 DIAGNOSIS — I361 Nonrheumatic tricuspid (valve) insufficiency: Secondary | ICD-10-CM

## 2020-03-27 DIAGNOSIS — N183 Chronic kidney disease, stage 3 unspecified: Secondary | ICD-10-CM

## 2020-03-27 DIAGNOSIS — E782 Mixed hyperlipidemia: Secondary | ICD-10-CM

## 2020-03-27 DIAGNOSIS — I4821 Permanent atrial fibrillation: Secondary | ICD-10-CM

## 2020-03-27 LAB — COMPREHENSIVE METABOLIC PANEL
ALT: 12 U/L (ref 0–44)
ALT: 11 U/L (ref 0–44)
AST: 17 U/L (ref 15–41)
AST: 21 U/L (ref 15–41)
Albumin: 3.5 g/dL (ref 3.5–5.0)
Albumin: 3 g/dL — ABNORMAL LOW (ref 3.5–5.0)
Alkaline Phosphatase: 40 U/L (ref 38–126)
Alkaline Phosphatase: 35 U/L — ABNORMAL LOW (ref 38–126)
Anion gap: 11 (ref 5–15)
Anion gap: 11 (ref 5–15)
BUN: 54 mg/dL — ABNORMAL HIGH (ref 8–23)
BUN: 55 mg/dL — ABNORMAL HIGH (ref 8–23)
CO2: 26 mmol/L (ref 22–32)
CO2: 29 mmol/L (ref 22–32)
Calcium: 8.8 mg/dL — ABNORMAL LOW (ref 8.9–10.3)
Calcium: 8.2 mg/dL — ABNORMAL LOW (ref 8.9–10.3)
Chloride: 98 mmol/L (ref 98–111)
Chloride: 95 mmol/L — ABNORMAL LOW (ref 98–111)
Creatinine, Ser: 1.64 mg/dL — ABNORMAL HIGH (ref 0.44–1.00)
Creatinine, Ser: 1.89 mg/dL — ABNORMAL HIGH (ref 0.44–1.00)
GFR, Estimated: 31 mL/min — ABNORMAL LOW (ref >60)
GFR, Estimated: 26 mL/min — ABNORMAL LOW (ref >60)
Glucose, Bld: 108 mg/dL — ABNORMAL HIGH (ref 70–99)
Glucose, Bld: 118 mg/dL — ABNORMAL HIGH (ref 70–99)
Potassium: 4.6 mmol/L (ref 3.5–5.1)
Potassium: 3.8 mmol/L (ref 3.5–5.1)
Sodium: 135 mmol/L (ref 135–145)
Sodium: 135 mmol/L (ref 135–145)
Total Bilirubin: 0.9 mg/dL (ref 0.3–1.2)
Total Bilirubin: 1.7 mg/dL — ABNORMAL HIGH (ref 0.3–1.2)
Total Protein: 5.8 g/dL — ABNORMAL LOW (ref 6.5–8.1)
Total Protein: 5.3 g/dL — ABNORMAL LOW (ref 6.5–8.1)

## 2020-03-27 LAB — CBC
HCT: 24.9 % — ABNORMAL LOW (ref 36.0–46.0)
HCT: 26 % — ABNORMAL LOW (ref 36.0–46.0)
Hemoglobin: 7.5 g/dL — ABNORMAL LOW (ref 12.0–15.0)
Hemoglobin: 8 g/dL — ABNORMAL LOW (ref 12.0–15.0)
MCH: 27.1 pg (ref 26.0–34.0)
MCH: 26.8 pg (ref 26.0–34.0)
MCHC: 30.1 g/dL (ref 30.0–36.0)
MCHC: 30.8 g/dL (ref 30.0–36.0)
MCV: 89.9 fL (ref 80.0–100.0)
MCV: 87.2 fL (ref 80.0–100.0)
Platelets: 217 10*3/uL (ref 150–400)
Platelets: 236 10*3/uL (ref 150–400)
RBC: 2.77 MIL/uL — ABNORMAL LOW (ref 3.87–5.11)
RBC: 2.98 MIL/uL — ABNORMAL LOW (ref 3.87–5.11)
RDW: 18.1 % — ABNORMAL HIGH (ref 11.5–15.5)
RDW: 18 % — ABNORMAL HIGH (ref 11.5–15.5)
WBC: 8.1 10*3/uL (ref 4.0–10.5)
WBC: 6.8 10*3/uL (ref 4.0–10.5)
nRBC: 1 % — ABNORMAL HIGH (ref 0.0–0.2)
nRBC: 1.2 % — ABNORMAL HIGH (ref 0.0–0.2)

## 2020-03-27 LAB — CBC WITH DIFFERENTIAL/PLATELET
Abs Immature Granulocytes: 0.02 10*3/uL (ref 0.00–0.07)
Basophils Absolute: 0 10*3/uL (ref 0.0–0.1)
Basophils Relative: 1 %
Eosinophils Absolute: 0.1 10*3/uL (ref 0.0–0.5)
Eosinophils Relative: 1 %
HCT: 21.7 % — ABNORMAL LOW (ref 36.0–46.0)
Hemoglobin: 6.2 g/dL — CL (ref 12.0–15.0)
Immature Granulocytes: 0 %
Lymphocytes Relative: 14 %
Lymphs Abs: 0.7 10*3/uL (ref 0.7–4.0)
MCH: 26.6 pg (ref 26.0–34.0)
MCHC: 28.6 g/dL — ABNORMAL LOW (ref 30.0–36.0)
MCV: 93.1 fL (ref 80.0–100.0)
Monocytes Absolute: 0.6 10*3/uL (ref 0.1–1.0)
Monocytes Relative: 12 %
Neutro Abs: 3.6 10*3/uL (ref 1.7–7.7)
Neutrophils Relative %: 72 %
Platelets: 262 10*3/uL (ref 150–400)
RBC: 2.33 MIL/uL — ABNORMAL LOW (ref 3.87–5.11)
RDW: 19.5 % — ABNORMAL HIGH (ref 11.5–15.5)
WBC: 5.1 10*3/uL (ref 4.0–10.5)
nRBC: 0.4 % — ABNORMAL HIGH (ref 0.0–0.2)

## 2020-03-27 LAB — TROPONIN I (HIGH SENSITIVITY)
Troponin I (High Sensitivity): 28 ng/L — ABNORMAL HIGH (ref <18)
Troponin I (High Sensitivity): 26 ng/L — ABNORMAL HIGH (ref <18)

## 2020-03-27 LAB — ECHOCARDIOGRAM COMPLETE
AR max vel: 0.97 cm2
AV Area VTI: 0.95 cm2
AV Area mean vel: 1.02 cm2
AV Mean grad: 13 mmHg
AV Peak grad: 23.5 mmHg
Ao pk vel: 2.42 m/s
Height: 66 in
P 1/2 time: 294 msec
Radius: 0.6 cm
S' Lateral: 2.2 cm
Weight: 2320 oz

## 2020-03-27 LAB — URINALYSIS, ROUTINE W REFLEX MICROSCOPIC
Bacteria, UA: NONE SEEN
Bilirubin Urine: NEGATIVE
Glucose, UA: NEGATIVE mg/dL
Hgb urine dipstick: NEGATIVE
Ketones, ur: NEGATIVE mg/dL
Nitrite: NEGATIVE
Protein, ur: NEGATIVE mg/dL
Specific Gravity, Urine: 1.009 (ref 1.005–1.030)
pH: 7 (ref 5.0–8.0)

## 2020-03-27 LAB — PREPARE RBC (CROSSMATCH)

## 2020-03-27 LAB — BRAIN NATRIURETIC PEPTIDE: B Natriuretic Peptide: 252.8 pg/mL — ABNORMAL HIGH (ref 0.0–100.0)

## 2020-03-27 LAB — RESPIRATORY PANEL BY RT PCR (FLU A&B, COVID)
Influenza A by PCR: NEGATIVE
Influenza B by PCR: NEGATIVE
SARS Coronavirus 2 by RT PCR: NEGATIVE

## 2020-03-27 LAB — PROTIME-INR
INR: 4.7 (ref 0.8–1.2)
Prothrombin Time: 43 seconds — ABNORMAL HIGH (ref 11.4–15.2)

## 2020-03-27 LAB — POC OCCULT BLOOD, ED: Fecal Occult Bld: POSITIVE — AB

## 2020-03-27 MED ORDER — FUROSEMIDE 10 MG/ML IJ SOLN: 80.0000 mg | Freq: Once | INTRAMUSCULAR | Status: DC

## 2020-03-27 MED ORDER — FUROSEMIDE 10 MG/ML IJ SOLN
80.0000 mg | Freq: Once | INTRAMUSCULAR | Status: AC
  Administered 2020-03-27: 80 mg via INTRAVENOUS
  Filled 2020-03-27: qty 8

## 2020-03-27 MED ORDER — ROSUVASTATIN CALCIUM 20 MG PO TABS
20.0000 mg | ORAL_TABLET | Freq: Every day | ORAL | Status: DC
  Administered 2020-03-27: 20 mg via ORAL
  Filled 2020-03-27: qty 1

## 2020-03-27 MED ORDER — METOLAZONE 2.5 MG PO TABS
2.5000 mg | ORAL_TABLET | ORAL | Status: DC
  Filled 2020-03-27: qty 1

## 2020-03-27 MED ORDER — PANTOPRAZOLE SODIUM 40 MG IV SOLR
40.0000 mg | Freq: Two times a day (BID) | INTRAVENOUS | Status: DC
  Administered 2020-03-27: 40 mg via INTRAVENOUS
  Filled 2020-03-27: qty 40

## 2020-03-27 MED ORDER — EZETIMIBE 10 MG PO TABS
10.0000 mg | ORAL_TABLET | Freq: Every day | ORAL | Status: DC
  Administered 2020-03-27 – 2020-03-31 (×5): 10 mg via ORAL
  Filled 2020-03-27 (×5): qty 1

## 2020-03-27 MED ORDER — ROSUVASTATIN CALCIUM 20 MG PO TABS
40.0000 mg | ORAL_TABLET | Freq: Every day | ORAL | Status: DC
  Administered 2020-03-28 – 2020-03-31 (×4): 40 mg via ORAL
  Filled 2020-03-27 (×5): qty 2

## 2020-03-27 MED ORDER — ONDANSETRON HCL 4 MG PO TABS
4.0000 mg | ORAL_TABLET | Freq: Four times a day (QID) | ORAL | Status: DC | PRN
  Administered 2020-03-28 – 2020-03-31 (×2): 4 mg via ORAL
  Filled 2020-03-27 (×2): qty 1

## 2020-03-27 MED ORDER — PANTOPRAZOLE SODIUM 40 MG PO TBEC
40.0000 mg | DELAYED_RELEASE_TABLET | Freq: Every day | ORAL | Status: DC
  Administered 2020-03-28 – 2020-03-31 (×2): 40 mg via ORAL
  Filled 2020-03-27 (×3): qty 1

## 2020-03-27 MED ORDER — POLYETHYLENE GLYCOL 3350 17 G PO PACK: 17.0000 g | PACK | Freq: Every day | ORAL | Status: DC | PRN

## 2020-03-27 MED ORDER — SODIUM CHLORIDE 0.9% IV SOLUTION: Freq: Once | INTRAVENOUS | Status: AC

## 2020-03-27 MED ORDER — LEVOTHYROXINE SODIUM 25 MCG PO TABS
25.0000 ug | ORAL_TABLET | Freq: Every day | ORAL | Status: DC
  Administered 2020-03-27 – 2020-03-31 (×3): 25 ug via ORAL
  Filled 2020-03-27 (×4): qty 1

## 2020-03-27 MED ORDER — TORSEMIDE 20 MG PO TABS
100.0000 mg | ORAL_TABLET | Freq: Two times a day (BID) | ORAL | Status: DC
  Filled 2020-03-27: qty 5

## 2020-03-27 MED ORDER — PHYTONADIONE 5 MG PO TABS
2.5000 mg | ORAL_TABLET | Freq: Once | ORAL | Status: AC
  Administered 2020-03-27: 2.5 mg via ORAL
  Filled 2020-03-27: qty 1

## 2020-03-27 MED ORDER — ACETAMINOPHEN 650 MG RE SUPP: 650.0000 mg | Freq: Four times a day (QID) | RECTAL | Status: DC | PRN

## 2020-03-27 MED ORDER — ACETAMINOPHEN 325 MG PO TABS
650.0000 mg | ORAL_TABLET | Freq: Four times a day (QID) | ORAL | Status: DC | PRN
  Administered 2020-03-27 – 2020-03-29 (×5): 650 mg via ORAL
  Filled 2020-03-27 (×4): qty 2

## 2020-03-27 MED ORDER — FUROSEMIDE 10 MG/ML IJ SOLN
60.0000 mg | Freq: Once | INTRAMUSCULAR | Status: AC
  Administered 2020-03-27: 60 mg via INTRAVENOUS
  Filled 2020-03-27: qty 6

## 2020-03-27 MED ORDER — ROSUVASTATIN CALCIUM 20 MG PO TABS: 40.0000 mg | ORAL_TABLET | Freq: Every day | ORAL | Status: DC

## 2020-03-27 MED ORDER — ONDANSETRON HCL 4 MG/2ML IJ SOLN
4.0000 mg | Freq: Four times a day (QID) | INTRAMUSCULAR | Status: DC | PRN
  Administered 2020-03-28 – 2020-03-30 (×2): 4 mg via INTRAVENOUS
  Filled 2020-03-27 (×3): qty 2

## 2020-03-27 NOTE — ED Notes (Signed)
Help get patient cleaned up sheets changed a brief placed patient is resting with call bell in reach

## 2020-03-27 NOTE — ED Notes (Signed)
Order breakfast

## 2020-03-27 NOTE — ED Notes (Signed)
Notified Dr. Starla Link of pt's trending lower BP's. Pt asymptomatic, and MAP maintaining at 65. No new orders at this time.

## 2020-03-27 NOTE — Consult Note (Signed)
Consultation Note Date: 03/27/2020   Patient Name: [name]  DOB: Aug 16, 1933  MRN: [id number]  Age / Sex: 86 y.o., female  PCP: Elby Beck, FNP Referring Physician: Aline August, MD  Reason for Consultation: Establishing goals of care  HPI/Patient Profile: 86 y.o. female  with past medical history of chronic atrial fibrillation on Coumadin, chronic diastolic CHF, nonischemic cardiomyopathy s/p ICD placement, chronic kidney disease stage 3, renal cell cancer s/p right nephrectomy 30-Jun-2001), and chronic anemia. She presented to the emergency department on 03/26/2020 with weakness, chest tightness, and dyspnea on exertion.  ED Course: Hemoglobin 6.2, positive FOBT, supratherapeutic INR at 4.7. Patient treated with PRBCs transfusion and vitamin K.   Clinical Assessment and Goals of Care: I have reviewed medical records including EPIC notes, labs and imaging, and met at bedside with patient and daughter [name]) to discuss diagnosis, prognosis, GOC, EOL wishes, disposition, and options.  I introduced Palliative Medicine as specialized medical care for people living with serious illness. It focuses on providing relief from the symptoms and stress of a serious illness.   We discussed a brief life review of the patient. 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 She has 2 daughters, [name] and [name]. Her husband died in 06/30/2005.   Patient lives in her home. She is ambulatory and independent with ADLs. Her great grandson [name]) lives with her. She enjoys being at home and cooking. She also has 4 life-long and close friends that she enjoys spending time with.    We discussed her current illness and what it means in the larger context of her ongoing co-morbidities.  Natural disease trajectory of heart failure was discussed. Patient is well aware of the extent of her  medical issues. She jokes "I've been told  I was a walking time bomb".    I attempted to elicit values and goals of care important to the patient. She wants to maintain her independence and remain in her home as long as possible. She reports having good quality of life and wants to be around for her family. She is hopeful to resolve the current issue and return home. She is willing to undergo the endo procedures offered by GI.    The difference between aggressive medical intervention and comfort care was considered in light of the patient's goals of care.  We did discuss code status. Encouraged patient to consider DNR/DNI status understanding evidenced based poor outcomes in similar hospitalized patients, as the cause of the arrest is likely associated with chronic/terminal disease rather than a reversible acute cardio-pulmonary event.   Daughter [name]) becomes very agitated when this NP is addressing code status, stating it is not an appropriate time/place for this discussion. [name] states she is "trying to keep her positive" and that this NP should not be encouraging her to consider DNR/DNI status as this indicates she is "giving up". [name] declines to engage in further discussion and walks out of the room.   Patient apologizes for  [name]'s behavior after she leaves. Patient is very clear she would not want prolonged support on a ventilator. She seems to indicate that DNR is appropriate however she is not yet ready to make that decision. She wants to discuss again tomorrow.   Questions and concerns were addressed.  The patient was given my card and encouraged to call with questions or concerns.   Primary decision maker: Patient. By legal default, surrogate decision making would be done by the 2 daughters. I have encouraged patient to complete HCPOA documents if she desires otherwise.     SUMMARY OF RECOMMENDATIONS   - full code full scope  - patient is willing to have endo procedures once  INR<1.5 - goal of care is to improve and return home - patient seems to indicate she agrees with DNR status but requests to discuss again later - patient would not want prolonged mechanical ventilation or other life-prolonging measures if there was no chance for meaningful recovery - PMT will continue to follow  Code Status/Advance Care Planning:  Full code (with ongoing discussions)  Symptom Management:   Per primary team  Palliative Prophylaxis:   Fall precautions  Additional Recommendations (Limitations, Scope, Preferences):  No prolonged mechanical ventilation  Psycho-social/Spiritual:   Created space and opportunity for patient and family to express thoughts and feelings regarding patient's current medical situation.   Emotional support provided   Prognosis:   Unable to determine  Discharge Planning: To Be Determined      Primary Diagnoses: Present on Admission: . Symptomatic anemia . Supratherapeutic INR . COPD (chronic obstructive pulmonary disease) ([place]) . Permanent atrial fibrillation . Hypothyroidism   I have reviewed the medical record, interviewed the patient and family, and examined the patient. The following aspects are pertinent.  Past Medical History:  Diagnosis Date  . Adjustment disorder with anxiety   . Anemia   . Anginal pain ([place])   . Arthritis    "some in my hands" (11/11/2012)  . Atrial fibrillation ([place])   . Automatic implantable cardioverter-defibrillator in situ   . Blood in stool   . Carotid artery stenosis 09/2007   a. 09/2007: 60-79% bilateral (stable); b. 10/2008: 40-59% R 60-79%   . Chest pain, unspecified   . Chronic airway obstruction, not elsewhere classified   . Chronic bronchitis ([place])    "get it some; not q year" (11/11/2012)  . Chronic diastolic CHF (congestive heart failure) ([place]) 06/04/2013  . Chronic kidney disease, unspecified   . Coronary artery disease    non-obstructive by 2006 cath  . Frequent UTI    "get them  a couple times/yr" (11/11/2012)  . Hemorrhage of rectum and anus   . High cholesterol   . History of blood transfusion 04/2011   "after hip OR" (11/11/2012)  . Hx of renal cell cancer   . Hypertension 05/20/2011  . Hypertr obst cardiomyop   . Hypotension, unspecified    cardiac cath 2006..nonobstructive CAD 30-40s lesions.Marland KitchenETT 1/09 nondiagnostic due to poor HR response..Right Renal Cancer 2003  . Long term (current) use of anticoagulants   . Malignant neoplasm of kidney, except pelvis   . Osteoarthritis of right hip   . Other and unspecified coagulation defects   . PONV (postoperative nausea and vomiting)   . Renal cancer ([place]) 06/2001   Right  . Secondary cardiomyopathy, unspecified   . Sinus bradycardia   . Urge incontinence     Family History  Problem Relation Age of Onset  . Heart failure Mother   .  Breast cancer Maternal Aunt 82  . Breast cancer Cousin   . Breast cancer Other   . Colon cancer Neg Hx    Scheduled Meds: . ezetimibe  10 mg Oral Daily  . furosemide  80 mg Intravenous Once  . levothyroxine  25 mcg Oral Q0600  . [START ON 03/28/2020] pantoprazole  40 mg Oral Q0600  . [START ON 03/28/2020] rosuvastatin  40 mg Oral Daily   Continuous Infusions: PRN Meds:.acetaminophen **OR** acetaminophen, ondansetron **OR** ondansetron (ZOFRAN) IV, polyethylene glycol Medications Prior to Admission:  Prior to Admission medications   Medication Sig Start Date End Date Taking? Authorizing Provider  acetaminophen (TYLENOL) 500 MG tablet Take 500-1,000 mg by mouth every 8 (eight) hours as needed for mild pain or headache.    Yes [provider]  cetirizine (ZYRTEC) 10 MG tablet Take 10 mg by mouth daily.    Yes [provider]  Cholecalciferol (VITAMIN D) 50 MCG (2000 UT) CAPS Take 4,000 Units by mouth daily.   Yes [provider]  conjugated estrogens (PREMARIN) vaginal cream Place 1 Applicatorful vaginally daily. Patient taking differently: Place 1  Applicatorful vaginally at bedtime.  07/31/18  Yes Elby Beck, FNP  Cranberry (AZO CRANBERRY GUMMIES) 500 MG CHEW Chew 2,000 mg by mouth daily.   Yes [provider]  Cyanocobalamin (B-12 PO) Take 1 tablet by mouth daily.    Yes [provider]  ezetimibe (ZETIA) 10 MG tablet Take 1 tablet (10 mg total) by mouth daily. 08/18/17  Yes Minna Merritts, MD  ferrous sulfate 325 (65 FE) MG tablet Take 1 tablet (325 mg total) by mouth 2 (two) times daily with a meal. 03/17/19  Yes Adhikari, Amrit, MD  fluticasone (FLONASE) 50 MCG/ACT nasal spray Place 2 sprays into both nostrils daily as needed for allergies or rhinitis. 07/27/19  Yes Elby Beck, FNP  levothyroxine (SYNTHROID) 25 MCG tablet TAKE 1 TABLET BY MOUTH ONCE DAILY BEFOREBREAKFAST Patient taking differently: Take 25 mcg by mouth daily before breakfast.  04/19/19  Yes Deboraha Sprang, MD  magnesium oxide (MAG-OX) 400 MG tablet Take 1 tablet (400 mg total) by mouth daily. 06/05/18  Yes Deboraha Sprang, MD  metolazone (ZAROXOLYN) 2.5 MG tablet Take 1 tablet (2.5 mg total) by mouth 2 (two) times a week. On mondays and thursdays 02/24/20  Yes Larey Dresser, MD  potassium chloride SA (KLOR-CON) 20 MEQ tablet Take 2 tablets (40 mEq total) by mouth 2 (two) times daily. 1 extra tab on metolazone days 11/30/19  Yes Clegg, Amy D, NP  rosuvastatin (CRESTOR) 40 MG tablet Take 1 tablet (40 mg total) by mouth daily. 11/11/18  Yes Larey Dresser, MD  torsemide (DEMADEX) 100 MG tablet Take 1 tablet (100 mg total) by mouth 2 (two) times daily. 12/13/19  Yes Larey Dresser, MD  vitamin C (ASCORBIC ACID) 250 MG tablet Take 500 mg by mouth daily.   Yes [provider]  warfarin (COUMADIN) 2 MG tablet TAKE 1 TABLETS BY MOUTH DAILY AT 6PM AS DIRECTED BY ANTICOAGULATION CLINIC. Patient taking differently: Take 2-3 mg by mouth See admin instructions. 2 mg on Wednesday, 3 mg the rest of the days 03/01/20  Yes Elby Beck, FNP    Allergies  Allergen Reactions  . Dilaudid [Hydromorphone] Other (See Comments)    Excessive Somnolence- Required Narcan  . Fentanyl Other (See Comments)    Required Narcan when given with Dilaudid. Tolerated single dose when given during procedure 02/03/2019.   Marland Kitchen  Codeine Nausea And Vomiting    Hallucinations   . Morphine And Related Nausea And Vomiting    Hallucinations   . Sulfonamide Derivatives Other (See Comments)    Dry mouth   Review of Systems  Constitutional: Positive for fatigue.  Neurological: Positive for weakness.    Physical Exam Vitals reviewed.  Constitutional:      General: She is not in acute distress.    Appearance: She is ill-appearing.  Cardiovascular:     Rate and Rhythm: Normal rate.  Pulmonary:     Effort: Pulmonary effort is normal.  Neurological:     Mental Status: She is alert and oriented to person, place, and time.     Vital Signs: BP (!) 108/45   Pulse 78   Temp 99.3 F (37.4 C) (Oral)   Resp (!) 24   Ht _0  (1.676 m)   Wt 65.8 kg   SpO2 99%   BMI 23.40 kg/m  Pain Scale: 0-10   Pain Score: 3    SpO2: SpO2: 99 % O2 Device:SpO2: 99 % O2 Flow Rate: .   IO: Intake/output summary:   Intake/Output Summary (Last 24 hours) at 03/27/2020 1522 Last data filed at 03/27/2020 1340 Gross per 24 hour  Intake 1130 ml  Output --  Net 1130 ml    LBM:   Baseline Weight: Weight: 65.8 kg Most recent weight: Weight: 65.8 kg      Palliative Assessment/Data: PPS 60%     Time In: 15:30 Time Out: 16:25 Time Total: 55 minutes Greater than 50%  of this time was spent counseling and coordinating care related to the above assessment and plan.  Signed by: Lavena Bullion, NP   Please contact Palliative Medicine Team phone at [phone] for questions and concerns.  For individual provider: See Shea Evans

## 2020-03-27 NOTE — Consult Note (Addendum)
Advanced Heart Failure Team Consult Note   Primary Physician: Elby Beck, FNP PCP-Cardiologist:  Virl Axe, MD  Reason for Consultation: CHF  HPI:    [name] is seen today for evaluation of CHF at the request of Dr. Starla Link.   86 y.o. with history of permanent atrial fibrillation, chronic diastolic CHF, CKD stage 3, smoking/prior COPD, renal cell CA s/p right nephrectomy. She has a prior history of HFrEF with nonischemic cardiomyopathy and had a Medtronic ICD placed.  However, subsequently her EF has recovered.  More recently, she has had symptomatic HFpEF.    Last echo in 6/20 showed EF 60-65%, mild RV dilation with normal systolic function, PASP 93 mmHg.[place] in 7/20 showed pulmonary venous hypertension.  PCWP tracing had prominent v-waves in absence of significant MR, suggesting significant diastolic dysfunction.   She has been noted to have significant Fe deficiency anemia. She was admitted in 11/20 with symptomatic anemia, GI workup concerning for ischemic bowel. 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 Doren Custard with [facility] and conservative management recommended.  Capsule endoscopy showed no definite source of bleeding.  She was volume overloaded due to significant RV dysfunction and diuresed, she also developed AKI on CKD stage 3.      She has had feraheme several times this year with Fe deficiency anemia.   She has been on torsemide 100 mg bid and metolazone biw at home.   Over the last week, she has felt profoundly weak. She has had a hard time getting out of bed.  Short of breath walking short distances.  Weight has been fairly stable.  She has not used her Cardiomems for about a week.  Stool has become more dark, now black.  She has had episodes of atypical chest pain, usually only lasting for seconds, sometimes triggered by cough.  She came to the ER for evaluation.  HS-TnI 28=>26.  Hgb 6.2 and FOBT+, she is now getting 2 units  PRBCs and has been given Lasix 80 mg IV.  INR was 4.7, she received po vitamin K. Creatinine mildly higher than prior at 1.64.   Review of Systems: All systems reviewed and negative except as per HPI.   Home Medications Prior to Admission medications   Medication Sig Start Date End Date Taking? Authorizing Provider  acetaminophen (TYLENOL) 500 MG tablet Take 500-1,000 mg by mouth every 8 (eight) hours as needed for mild pain or headache.    Yes [provider]  cetirizine (ZYRTEC) 10 MG tablet Take 10 mg by mouth daily.    Yes [provider]  Cholecalciferol (VITAMIN D) 50 MCG (2000 UT) CAPS Take 4,000 Units by mouth daily.   Yes [provider]  conjugated estrogens (PREMARIN) vaginal cream Place 1 Applicatorful vaginally daily. Patient taking differently: Place 1 Applicatorful vaginally at bedtime.  07/31/18  Yes Elby Beck, FNP  Cranberry (AZO CRANBERRY GUMMIES) 500 MG CHEW Chew 2,000 mg by mouth daily.   Yes [provider]  Cyanocobalamin (B-12 PO) Take 1 tablet by mouth daily.    Yes [provider]  ezetimibe (ZETIA) 10 MG tablet Take 1 tablet (10 mg total) by mouth daily. 08/18/17  Yes Minna Merritts, MD  ferrous sulfate 325 (65 FE) MG tablet Take 1 tablet (325 mg total) by mouth 2 (two) times daily with a meal. 03/17/19  Yes Adhikari, Amrit, MD  fluticasone (FLONASE) 50 MCG/ACT nasal spray Place 2 sprays into both nostrils daily as  needed for allergies or rhinitis. 07/27/19  Yes Elby Beck, FNP  levothyroxine (SYNTHROID) 25 MCG tablet TAKE 1 TABLET BY MOUTH ONCE DAILY BEFOREBREAKFAST Patient taking differently: Take 25 mcg by mouth daily before breakfast.  04/19/19  Yes Deboraha Sprang, MD  magnesium oxide (MAG-OX) 400 MG tablet Take 1 tablet (400 mg total) by mouth daily. 06/05/18  Yes Deboraha Sprang, MD  metolazone (ZAROXOLYN) 2.5 MG tablet Take 1 tablet (2.5 mg total) by mouth 2 (two) times a week. On mondays and thursdays  02/24/20  Yes Larey Dresser, MD  potassium chloride SA (KLOR-CON) 20 MEQ tablet Take 2 tablets (40 mEq total) by mouth 2 (two) times daily. 1 extra tab on metolazone days 11/30/19  Yes Clegg, Amy D, NP  rosuvastatin (CRESTOR) 40 MG tablet Take 1 tablet (40 mg total) by mouth daily. 11/11/18  Yes Larey Dresser, MD  torsemide (DEMADEX) 100 MG tablet Take 1 tablet (100 mg total) by mouth 2 (two) times daily. 12/13/19  Yes Larey Dresser, MD  vitamin C (ASCORBIC ACID) 250 MG tablet Take 500 mg by mouth daily.   Yes [provider]  warfarin (COUMADIN) 2 MG tablet TAKE 1 TABLETS BY MOUTH DAILY AT 6PM AS DIRECTED BY ANTICOAGULATION CLINIC. Patient taking differently: Take 2-3 mg by mouth See admin instructions. 2 mg on Wednesday, 3 mg the rest of the days 03/01/20  Yes Elby Beck, FNP    Past Medical History: Past Medical History:  Diagnosis Date  . Adjustment disorder with anxiety   . Anemia   . Anginal pain ([place])   . Arthritis    "some in my hands" (11/11/2012)  . Atrial fibrillation ([place])   . Automatic implantable cardioverter-defibrillator in situ   . Blood in stool   . Carotid artery stenosis 09/2007   a. 09/2007: 60-79% bilateral (stable); b. 10/2008: 40-59% R 60-79%   . Chest pain, unspecified   . Chronic airway obstruction, not elsewhere classified   . Chronic bronchitis ([place])    "get it some; not q year" (11/11/2012)  . Chronic diastolic CHF (congestive heart failure) ([place]) 06/04/2013  . Chronic kidney disease, unspecified   . Coronary artery disease    non-obstructive by 2006 cath  . Frequent UTI    "get them a couple times/yr" (11/11/2012)  . Hemorrhage of rectum and anus   . High cholesterol   . History of blood transfusion 04/2011   "after hip OR" (11/11/2012)  . Hx of renal cell cancer   . Hypertension 05/20/2011  . Hypertr obst cardiomyop   . Hypotension, unspecified    cardiac cath 2006..nonobstructive CAD 30-40s lesions.Marland KitchenETT 1/09 nondiagnostic due to poor HR  response..Right Renal Cancer 2003  . Long term (current) use of anticoagulants   . Malignant neoplasm of kidney, except pelvis   . Osteoarthritis of right hip   . Other and unspecified coagulation defects   . PONV (postoperative nausea and vomiting)   . Renal cancer ([place]) 06/2001   Right  . Secondary cardiomyopathy, unspecified   . Sinus bradycardia   . Urge incontinence     Past Surgical History: Past Surgical History:  Procedure Laterality Date  . ABDOMINAL HYSTERECTOMY  1975   for benign causes  . APPENDECTOMY    . BI-VENTRICULAR PACEMAKER UPGRADE  05/04/2010  . BIOPSY  02/28/2019   Procedure: BIOPSY;  Surgeon: Milus Banister, MD;  Location[facility] ENDOSCOPY;  Service: Endoscopy;;  . CARDIAC CATHETERIZATION  2006  . CARDIOVERSION N/A  02/20/2018   Procedure: CARDIOVERSION;  Surgeon: Minna Merritts, MD;  Location: [facility] ORS;  Service: Cardiovascular;  Laterality: N/A;  . CARDIOVERSION N/A 03/27/2018   Procedure: CARDIOVERSION;  Surgeon: Minna Merritts, MD;  Location: [facility] ORS;  Service: Cardiovascular;  Laterality: N/A;  . CATARACT EXTRACTION W/ INTRAOCULAR LENS  IMPLANT, BILATERAL  01/2006-02-2006  . CHOLECYSTECTOMY N/A 11/11/2012   Procedure: LAPAROSCOPIC CHOLECYSTECTOMY WITH INTRAOPERATIVE CHOLANGIOGRAM;  Surgeon: Imogene Burn. Georgette Dover, MD;  Location: [place];  Service: General;  Laterality: N/A;  . COLONOSCOPY WITH PROPOFOL N/A 02/28/2019   Procedure: COLONOSCOPY WITH PROPOFOL;  Surgeon: Milus Banister, MD;  Location[facility] ENDOSCOPY;  Service: Endoscopy;  Laterality: N/A;  . EP IMPLANTABLE DEVICE N/A 02/21/2016   Procedure: ICD Generator Changeout;  Surgeon: Deboraha Sprang, MD;  Location: [place] CV LAB;  Service: Cardiovascular;  Laterality: N/A;  . ESOPHAGOGASTRODUODENOSCOPY (EGD) WITH PROPOFOL N/A 02/28/2019   Procedure: ESOPHAGOGASTRODUODENOSCOPY (EGD) WITH PROPOFOL;  Surgeon: Milus Banister, MD;  Location: [facility] ENDOSCOPY;  Service: Endoscopy;  Laterality: N/A;  . GIVENS  CAPSULE STUDY N/A 03/15/2019   Procedure: GIVENS CAPSULE STUDY;  Surgeon: Thornton Park, MD;  Location: [place];  Service: Gastroenterology;  Laterality: N/A;  . INSERT / REPLACE / REMOVE PACEMAKER  05-01-11   02-28-05-/05-04-10-ICD-MEDTRONIC MAXIMAL DR  . JOINT REPLACEMENT    . LAPAROSCOPIC CHOLECYSTECTOMY  11/11/2012  . LAPAROSCOPIC LYSIS OF ADHESIONS N/A 11/11/2012   Procedure: LAPAROSCOPIC LYSIS OF ADHESIONS;  Surgeon: Imogene Burn. Georgette Dover, MD;  Location: [place];  Service: General;  Laterality: N/A;  . NEPHRECTOMY Right 06/2001    S/P RENAL CELL CANCER  . PRESSURE SENSOR/CARDIOMEMS N/A 02/03/2019   Procedure: PRESSURE SENSOR/CARDIOMEMS;  Surgeon: Larey Dresser, MD;  Location: [place] CV LAB;  Service: Cardiovascular;  Laterality: N/A;  . RIGHT HEART CATH N/A 11/09/2018   Procedure: RIGHT HEART CATH;  Surgeon: Larey Dresser, MD;  Location: [place] CV LAB;  Service: Cardiovascular;  Laterality: N/A;  . RIGHT HEART CATH N/A 03/08/2019   Procedure: RIGHT HEART CATH;  Surgeon: Larey Dresser, MD;  Location: [place] CV LAB;  Service: Cardiovascular;  Laterality: N/A;  . SUBMUCOSAL TATTOO INJECTION  02/28/2019   Procedure: SUBMUCOSAL TATTOO INJECTION;  Surgeon: Milus Banister, MD;  Location[facility] ENDOSCOPY;  Service: Endoscopy;;  . TOTAL HIP ARTHROPLASTY Right 05/03/2011   Procedure: TOTAL HIP ARTHROPLASTY ANTERIOR APPROACH;  Surgeon: Mcarthur Rossetti;  Location: [facility] ORS;  Service: Orthopedics;  Laterality: Right;  Removal of Cannulated Screws Right Hip, Right Direct Anterior Hip Replacement  . TOTAL HIP ARTHROPLASTY Left 10/09/2018   Procedure: TOTAL HIP ARTHROPLASTY ANTERIOR APPROACH;  Surgeon: Rod Can, MD;  Location: [place];  Service: Orthopedics;  Laterality: Left;    Family History: Family History  Problem Relation Age of Onset  . Heart failure Mother   . Breast cancer Maternal Aunt 82  . Breast cancer Cousin   . Breast cancer Other   . Colon cancer Neg Hx      Social History: Social History   Socioeconomic History  . Marital status: Widowed    Spouse name: Not on file  . Number of children: Not on file  . Years of education: Not on file  . Highest education level: Not on file  Occupational History  . Occupation: Retired    Fish farm manager: RETIRED  Tobacco Use  . Smoking status: Former Smoker    Packs/day: 0.50    Years: 40.00    Pack years: 20.00  Types: Cigarettes    Quit date: 05/06/2001    Years since quitting: 18.9  . Smokeless tobacco: Never Used  Vaping Use  . Vaping Use: Never used  Substance and Sexual Activity  . Alcohol use: No    Alcohol/week: 0.0 standard drinks  . Drug use: No  . Sexual activity: Never  Other Topics Concern  . 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   ALCOHOL USE-NO   RETIRED   FORMER TOBACCO USE.....1/2 PACK X 4 YRS UNTIL 2003   Would desire CPR   Social Determinants of Health   Financial Resource Strain:   . Difficulty of Paying Living Expenses: Not on file  Food Insecurity:   . Worried About Charity fundraiser in the Last Year: Not on file  . Ran Out of Food in the Last Year: Not on file  Transportation Needs:   . Lack of Transportation (Medical): Not on file  . Lack of Transportation (Non-Medical): Not on file  Physical Activity:   . Days of Exercise per Week: Not on file  . Minutes of Exercise per Session: Not on file  Stress:   . Feeling of Stress : Not on file  Social Connections:   . Frequency of Communication with Friends and Family: Not on file  . Frequency of Social Gatherings with Friends and Family: Not on file  . Attends Religious Services: Not on file  . Active Member of Clubs or Organizations: Not on file  . Attends Archivist Meetings: Not on file  . Marital Status: Not on file    Allergies:   Allergies  Allergen Reactions  . Dilaudid [Hydromorphone] Other (See Comments)    Excessive Somnolence- Required Narcan  . Fentanyl Other (See Comments)    Required Narcan when given with Dilaudid. Tolerated single dose when given during procedure 02/03/2019.   Marland Kitchen Codeine Nausea And Vomiting    Hallucinations   . Morphine And Related Nausea And Vomiting    Hallucinations   . Sulfonamide Derivatives Other (See Comments)    Dry mouth    Objective:    Vital Signs:   Temp:  [98 F (36.7 C)-98.2 F (36.8 C)] 98.1 F (36.7 C) (11/22 0954) Pulse Rate:  [50-81] 67 (11/22 1045) Resp:  [16-34] 25 (11/22 1045) BP: (106-147)/(40-113) 120/59 (11/22 1045) SpO2:  [75 %-100 %] 99 % (11/22 1045) Weight:  [65.8 kg] 65.8 kg (11/21 2342)    Weight change: Filed Weights   03/26/20 2342  Weight: 65.8 kg    Intake/Output:   Intake/Output Summary (Last 24 hours) at 03/27/2020 1108 Last data filed at 03/27/2020 0954 Gross per 24 hour  Intake 815 ml  Output --  Net 815 ml      Physical Exam    General:  Pale, NAD HEENT: normal Neck: supple. JVP 14 cm with HJR. Carotids 2+ bilat; no bruits. No lymphadenopathy or thyromegaly appreciated. Cor: PMI nondisplaced. Irregular rate & rhythm. No rubs, gallops. 3/6 HSM LLSB/apex.  Lungs: clear Abdomen: soft, nontender, nondistended. No hepatosplenomegaly. No bruits or masses. Good bowel sounds. Extremities: no cyanosis, clubbing, rash.  Trace ankle edema.  Neuro: alert & orientedx3, cranial nerves grossly intact. moves all 4 extremities w/o difficulty. Affect pleasant   Telemetry   Atrial fibrillation rate 70s (personally reviewed)  EKG  Atrial fibrillation, IVCD 140 msec (personally reviewed)  Labs   Basic Metabolic Panel: Recent Labs  Lab 03/27/20 0001  NA 135  K 4.6  CL 98  CO2 26  GLUCOSE 108*  BUN 54*  CREATININE 1.64*  CALCIUM 8.8*    Liver Function Tests: Recent Labs  Lab 03/27/20 0001  AST 17  ALT 12   ALKPHOS 40  BILITOT 0.9  PROT 5.8*  ALBUMIN 3.5   No results for input(s): LIPASE, AMYLASE in the last 168 hours. No results for input(s): AMMONIA in the last 168 hours.  CBC: Recent Labs  Lab 03/20/20 1140 03/27/20 0001  WBC 4.3 5.1  NEUTROABS  --  3.6  HGB 8.3* 6.2*  HCT 26.4* 21.7*  MCV 86.3 93.1  PLT 229 262    Cardiac Enzymes: No results for input(s): CKTOTAL, CKMB, CKMBINDEX, TROPONINI in the last 168 hours.  BNP: BNP (last 3 results) Recent Labs    06/03/19 1508 03/27/20 0001  BNP 386.1* 252.8*    ProBNP (last 3 results) Recent Labs    05/06/19 1201  PROBNP 431.0*     CBG: No results for input(s): GLUCAP in the last 168 hours.  Coagulation Studies: Recent Labs    03/27/20 0204  LABPROT 43.0*  INR 4.7*     Imaging   DG Chest Portable 1 View  Result Date: 03/27/2020 CLINICAL DATA:  eva EXAM: PORTABLE CHEST 1 VIEW COMPARISON:  02/25/2019 FINDINGS: Cardiac pacemaker. Cardiac enlargement. No vascular congestion or edema. No focal consolidation. There is blunting of both costophrenic angles, unchanged since prior study, consistent with recurrent effusions or pleural thickening. No pneumothorax. Scattered calcified granulomas in the lungs. Slight interstitial pattern to the lungs likely represents fibrosis. Calcification of the aorta. Mediastinal contours appear intact. Sclerosis in the proximal left humerus is unchanged, likely and chondroma. IMPRESSION: Cardiac enlargement. No evidence of active pulmonary disease. Chronic effusions or pleural thickening in the costophrenic angles. Electronically Signed   By: Lucienne Capers M.D.   On: 03/27/2020 00:32      Medications:     Current Medications: . ezetimibe  10 mg Oral Daily  . levothyroxine  25 mcg Oral Q0600  . [START ON 03/28/2020] pantoprazole  40 mg Oral Q0600  . rosuvastatin  20 mg Oral Daily     Infusions:     Assessment/Plan   1. Anemia:  Baseline Hgb 8-10 with hx of Fe  deficiency anemia. 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  She has been getting regular Feraheme infusions. Today, Hgb 6.2, positive FOBT with supratherapeutic INR. She has received 2.5 mg vitamin K.  - GI consulted, evaluate for endoscopy.  - She has not had a major GI event for about a year prior to this.  Given high risk for CVA from AF, would favor eventually restarting her on Eliquis rather than warfarin when current bleeding issues have been resolved.  Would use 2.5 mg bid.  2. Acute on chronic diastolic CHF: Echo in 0/30 showed EF 60-65%, mild RV dilation with normal systolic function, PASP 93 mmHg. She had remote nonischemic cardiomyopathy with recovery of EF, has Medtronic ICD. RHC11/20showed elevated filling pressures and pulmonary venous hypertension. Prominent v-waves on PCWP tracing likely due to stiff ventricle/diastolic dysfunction as she had only mild MR on echo. She does appear to have volume overload on exam though creatinine also mildly elevated from prior check.  Suspect anemia is the main etiology for her fatigue and weakness, but volume overload does not help.  -  She got Lasix 80 mg IV with blood this morning.   Would repeat 80 mg IV x 1 this evening, then reassess (possibly restart her home regimen tomorrow).  - I will arrange for repeat echo.  3. Pulmonary hypertension: Based on RHC11/20, she has primarily pulmonary venous hypertension.  4. CKD: Stage 3. hx of RCC s/p R nephrectomy.  Baseline Cr around 1.3-1.4.  Today, Cr 1.64 with BUN 54   - Follow BMET closely.  6. Atrial fibrillation: Permanent now. Rate control is reasonable. She has failed amiodarone, Tikosyn, and flecainide as well as multiple cardioversions.  - Hold warfarin.  INR 4.7, given vitamin K 2.5 mg.  - See discussion above => would like to get her back on anticoagulation eventually, but would use Eliquis 2.5 mg bid when restarted.  7. Chest pain:  Atypical chest pain, associated sometimes  with cough, not clearly exertional.  Hs-TnI slightly elevated with no trend.  Possible demand ischemia from anemia and volume overload. Think unlikely this is ACS.   - Would not work up further at this time.   Length of Stay: 0   Loralie Champagne 03/27/2020 11:45 AM  Advanced Heart Failure Team Pager [phone] (M-F; 7a - 4p)  Please contact [facility] for night-coverage after hours (4p -7a ) and weekends on [email or web address]

## 2020-03-27 NOTE — H&P (Signed)
History and Physical    [name] XBJ:[id number] DOB: [birth year] DOA: 03/26/2020  PCP: Elby Beck, FNP  Patient coming from: Home   Chief Complaint:  Chief Complaint  Patient presents with   Chest Pain     HPI:    86 year old female with past medical history of atrial fibrillation (on coumadin), diastolic congestive heart failure (Echo 10/2018 EF 60-65%), nonischemic cardiomyopathy status post ICD placement, hypothyroidism, hypertension, COPD, peripheral artery disease, chronic kidney disease stage IIIb, iron deficiency anemia and ischemic colitis who presents to [facility] emergency department with complaints of generalized weakness.  Patient explains that approximately 1/2 weeks ago she began to develop generalized weakness.  Initially this was mild in intensity but progressively became more more severe as the days progressed.  Weakness became associated with bouts of lightheadedness, particularly after rising from seated position.  Patient reports ongoing "black" stool however patient takes daily iron supplements.  Patient denies any hematemesis, epistaxis,  heavy bruising or hematuria.  As the days progressed patient's symptoms became more and more severe.  Patient began to develop associated shortness of breath, severe in intensity worse with movement and improved with rest.  Upon further questioning, patient denies chest pain, fevers, sick contacts, nausea, vomiting or diarrhea.  Patient denies alcohol use or NSAID use.  Patient states that she does take her Coumadin as instructed.  Due to patient's progressively worsening symptoms the patient eventually presented to [facility] emergency department for evaluation.  Upon evaluation in the emergency department, hemoglobin was found to be 6.2 without any clinical evidence of frank bleeding.  The emergency department provider ordered for 2 units of packed red blood cells to be initiated.  It was also  determined the patient's INR was supratherapeutic at 4.7 therefore 2.5 mg of vitamin K were administered by the emergency department staff.  The hospitalist group was then called to assess the patient for admission the hospital.  Review of Systems:   Review of Systems  Constitutional: Positive for malaise/fatigue.  Respiratory: Positive for shortness of breath.   Neurological: Positive for weakness.  All other systems reviewed and are negative.   Past Medical History:  Diagnosis Date   Adjustment disorder with anxiety    Anemia    Anginal pain ([place])    Arthritis    "some in my hands" (11/11/2012)   Atrial fibrillation (HCC)    Automatic implantable cardioverter-defibrillator in situ    Blood in stool    Carotid artery stenosis 09/2007   a. 09/2007: 60-79% bilateral (stable); b. 10/2008: 40-59% R 60-79%    Chest pain, unspecified    Chronic airway obstruction, not elsewhere classified    Chronic bronchitis ([place])    "get it some; not q year" (11/11/2012)   Chronic diastolic CHF (congestive heart failure) ([place]) 06/04/2013   Chronic kidney disease, unspecified    Coronary artery disease    non-obstructive by 2006 cath   Frequent UTI    "get them a couple times/yr" (11/11/2012)   Hemorrhage of rectum and anus    High cholesterol    History of blood transfusion 04/2011   "after hip OR" (11/11/2012)   Hx of renal cell cancer    Hypertension 05/20/2011   Hypertr obst cardiomyop    Hypotension, unspecified    cardiac cath 2006..nonobstructive CAD 30-40s lesions.Marland KitchenETT 1/09 nondiagnostic due to poor HR response..Right Renal Cancer 2003   Long term (current) use of anticoagulants    Malignant neoplasm of kidney, except pelvis  Osteoarthritis of right hip    Other and unspecified coagulation defects    PONV (postoperative nausea and vomiting)    Renal cancer ([place]) 06/2001   Right   Secondary cardiomyopathy, unspecified    Sinus bradycardia    Urge incontinence      Past Surgical History:  Procedure Laterality Date   ABDOMINAL HYSTERECTOMY  1975   for benign causes   APPENDECTOMY     BI-VENTRICULAR PACEMAKER UPGRADE  05/04/2010   BIOPSY  02/28/2019   Procedure: BIOPSY;  Surgeon: Milus Banister, MD;  Location[facility] ENDOSCOPY;  Service: Endoscopy;;   CARDIAC CATHETERIZATION  2006   CARDIOVERSION N/A 02/20/2018   Procedure: CARDIOVERSION;  Surgeon: Minna Merritts, MD;  Location: [facility] ORS;  Service: Cardiovascular;  Laterality: N/A;   CARDIOVERSION N/A 03/27/2018   Procedure: CARDIOVERSION;  Surgeon: Minna Merritts, MD;  Location: [facility] ORS;  Service: Cardiovascular;  Laterality: N/A;   CATARACT EXTRACTION W/ INTRAOCULAR LENS  IMPLANT, BILATERAL  01/2006-02-2006   CHOLECYSTECTOMY N/A 11/11/2012   Procedure: LAPAROSCOPIC CHOLECYSTECTOMY WITH INTRAOPERATIVE CHOLANGIOGRAM;  Surgeon: Imogene Burn. Georgette Dover, MD;  Location: [place];  Service: General;  Laterality: N/A;   COLONOSCOPY WITH PROPOFOL N/A 02/28/2019   Procedure: COLONOSCOPY WITH PROPOFOL;  Surgeon: Milus Banister, MD;  Location: [facility] ENDOSCOPY;  Service: Endoscopy;  Laterality: N/A;   EP IMPLANTABLE DEVICE N/A 02/21/2016   Procedure: ICD Generator Changeout;  Surgeon: Deboraha Sprang, MD;  Location: [place] CV LAB;  Service: Cardiovascular;  Laterality: N/A;   ESOPHAGOGASTRODUODENOSCOPY (EGD) WITH PROPOFOL N/A 02/28/2019   Procedure: ESOPHAGOGASTRODUODENOSCOPY (EGD) WITH PROPOFOL;  Surgeon: Milus Banister, MD;  Location: [facility] ENDOSCOPY;  Service: Endoscopy;  Laterality: N/A;   GIVENS CAPSULE STUDY N/A 03/15/2019   Procedure: GIVENS CAPSULE STUDY;  Surgeon: Thornton Park, MD;  Location: [place];  Service: Gastroenterology;  Laterality: N/A;   INSERT / REPLACE / REMOVE PACEMAKER  05-01-11   02-28-05-/05-04-10-ICD-MEDTRONIC MAXIMAL DR   JOINT REPLACEMENT     LAPAROSCOPIC CHOLECYSTECTOMY  11/11/2012   LAPAROSCOPIC LYSIS OF ADHESIONS N/A 11/11/2012   Procedure: LAPAROSCOPIC  LYSIS OF ADHESIONS;  Surgeon: Imogene Burn. Georgette Dover, MD;  Location: [place];  Service: General;  Laterality: N/A;   NEPHRECTOMY Right 06/2001    S/P RENAL CELL CANCER   PRESSURE SENSOR/CARDIOMEMS N/A 02/03/2019   Procedure: PRESSURE SENSOR/CARDIOMEMS;  Surgeon: Larey Dresser, MD;  Location: [place] CV LAB;  Service: Cardiovascular;  Laterality: N/A;   RIGHT HEART CATH N/A 11/09/2018   Procedure: RIGHT HEART CATH;  Surgeon: Larey Dresser, MD;  Location: [place] CV LAB;  Service: Cardiovascular;  Laterality: N/A;   RIGHT HEART CATH N/A 03/08/2019   Procedure: RIGHT HEART CATH;  Surgeon: Larey Dresser, MD;  Location: [place] CV LAB;  Service: Cardiovascular;  Laterality: N/A;   SUBMUCOSAL TATTOO INJECTION  02/28/2019   Procedure: SUBMUCOSAL TATTOO INJECTION;  Surgeon: Milus Banister, MD;  Location: [facility] ENDOSCOPY;  Service: Endoscopy;;   TOTAL HIP ARTHROPLASTY Right 05/03/2011   Procedure: TOTAL HIP ARTHROPLASTY ANTERIOR APPROACH;  Surgeon: Mcarthur Rossetti;  Location: [facility] ORS;  Service: Orthopedics;  Laterality: Right;  Removal of Cannulated Screws Right Hip, Right Direct Anterior Hip Replacement   TOTAL HIP ARTHROPLASTY Left 10/09/2018   Procedure: TOTAL HIP ARTHROPLASTY ANTERIOR APPROACH;  Surgeon: Rod Can, MD;  Location: [place];  Service: Orthopedics;  Laterality: Left;     reports that she quit smoking about 18 years ago. Her smoking use included cigarettes. She has a 20.00  pack-year smoking history. She has never used smokeless tobacco. She reports that she does not drink alcohol and does not use drugs.  Allergies  Allergen Reactions   Dilaudid [Hydromorphone] Other (See Comments)    Excessive Somnolence- Required Narcan   Fentanyl Other (See Comments)    Required Narcan when given with Dilaudid. Tolerated single dose when given during procedure 02/03/2019.    Codeine Nausea And Vomiting    Hallucinations    Morphine And Related Nausea And Vomiting     Hallucinations    Sulfonamide Derivatives Other (See Comments)    Dry mouth    Family History  Problem Relation Age of Onset   Heart failure Mother    Breast cancer Maternal Aunt 82   Breast cancer Cousin    Breast cancer Other    Colon cancer Neg Hx      Prior to Admission medications   Medication Sig Start Date End Date Taking? Authorizing Provider  acetaminophen (TYLENOL) 500 MG tablet Take 500-1,000 mg by mouth every 8 (eight) hours as needed for mild pain or headache.    Yes [provider]  cetirizine (ZYRTEC) 10 MG tablet Take 10 mg by mouth daily.    Yes [provider]  Cholecalciferol (VITAMIN D) 50 MCG (2000 UT) CAPS Take 4,000 Units by mouth daily.   Yes [provider]  conjugated estrogens (PREMARIN) vaginal cream Place 1 Applicatorful vaginally daily. Patient taking differently: Place 1 Applicatorful vaginally at bedtime.  07/31/18  Yes Elby Beck, FNP  Cranberry (AZO CRANBERRY GUMMIES) 500 MG CHEW Chew 2,000 mg by mouth daily.   Yes [provider]  Cyanocobalamin (B-12 PO) Take 1 tablet by mouth daily.    Yes [provider]  ezetimibe (ZETIA) 10 MG tablet Take 1 tablet (10 mg total) by mouth daily. 08/18/17  Yes Minna Merritts, MD  ferrous sulfate 325 (65 FE) MG tablet Take 1 tablet (325 mg total) by mouth 2 (two) times daily with a meal. 03/17/19  Yes Adhikari, Amrit, MD  fluticasone (FLONASE) 50 MCG/ACT nasal spray Place 2 sprays into both nostrils daily as needed for allergies or rhinitis. 07/27/19  Yes Elby Beck, FNP  levothyroxine (SYNTHROID) 25 MCG tablet TAKE 1 TABLET BY MOUTH ONCE DAILY BEFOREBREAKFAST Patient taking differently: Take 25 mcg by mouth daily before breakfast.  04/19/19  Yes Deboraha Sprang, MD  magnesium oxide (MAG-OX) 400 MG tablet Take 1 tablet (400 mg total) by mouth daily. 06/05/18  Yes Deboraha Sprang, MD  metolazone (ZAROXOLYN) 2.5 MG tablet Take 1 tablet (2.5 mg total) by  mouth 2 (two) times a week. On mondays and thursdays 02/24/20  Yes Larey Dresser, MD  potassium chloride SA (KLOR-CON) 20 MEQ tablet Take 2 tablets (40 mEq total) by mouth 2 (two) times daily. 1 extra tab on metolazone days 11/30/19  Yes Clegg, Amy D, NP  rosuvastatin (CRESTOR) 40 MG tablet Take 1 tablet (40 mg total) by mouth daily. 11/11/18  Yes Larey Dresser, MD  torsemide (DEMADEX) 100 MG tablet Take 1 tablet (100 mg total) by mouth 2 (two) times daily. 12/13/19  Yes Larey Dresser, MD  vitamin C (ASCORBIC ACID) 250 MG tablet Take 500 mg by mouth daily.   Yes [provider]  warfarin (COUMADIN) 2 MG tablet TAKE 1 TABLETS BY MOUTH DAILY AT 6PM AS DIRECTED BY ANTICOAGULATION CLINIC. Patient taking differently: Take 2-3 mg by mouth See admin instructions. 2 mg on Wednesday, 3 mg the  rest of the days 03/01/20  Yes Elby Beck, FNP    Physical Exam: Vitals:   03/27/20 0415 03/27/20 0512 03/27/20 0545 03/27/20 0601  BP: 137/65 (!) 132/49 106/88 (!) 129/40  Pulse: 62 64 61 61  Resp: 17 (!) 23 (!) 24 (!) 22  Temp:   98 F (36.7 C) 98.2 F (36.8 C)  TempSrc:   Oral Oral  SpO2: 99% 97% 97% 98%  Weight:      Height:        Constitutional: Acute alert and oriented x3, no associated distress.   Skin: Increased skin pallor noted.  No rashes, no lesions, poor skin turgor noted. Eyes: Pupils are equally reactive to light.  Notable conjunctival pallor without any evidence of scleral icterus. ENMT: Moist mucous membranes noted.  Posterior pharynx clear of any exudate or lesions.   Neck: normal, supple, no masses, no thyromegaly.  No evidence of jugular venous distension.   Respiratory: clear to auscultation bilaterally, no wheezing, no crackles. Normal respiratory effort. No accessory muscle use.  Cardiovascular: Regular rate and rhythm, no murmurs / rubs / gallops. No extremity edema. 2+ pedal pulses. No carotid bruits.  Chest:   Nontender without crepitus or deformity.   Back:    Nontender without crepitus or deformity. Abdomen: Abdomen is soft and nontender.  No evidence of intra-abdominal masses.  Positive bowel sounds noted in all quadrants.   Musculoskeletal: No joint deformity upper and lower extremities. Good ROM, no contractures. Normal muscle tone.  Neurologic: CN 2-12 grossly intact. Sensation intact.  Patient moving all 4 extremities spontaneously.  Patient is following all commands.  Patient is responsive to verbal stimuli.   Psychiatric: Patient exhibits normal mood with appropriate affect.  Patient seems to possess insight as to their current situation.     Labs on Admission: I have personally reviewed following labs and imaging studies -   CBC: Recent Labs  Lab 03/20/20 1140 03/27/20 0001  WBC 4.3 5.1  NEUTROABS  --  3.6  HGB 8.3* 6.2*  HCT 26.4* 21.7*  MCV 86.3 93.1  PLT 229 132   Basic Metabolic Panel: Recent Labs  Lab 03/27/20 0001  NA 135  K 4.6  CL 98  CO2 26  GLUCOSE 108*  BUN 54*  CREATININE 1.64*  CALCIUM 8.8*   GFR: Estimated Creatinine Clearance: 23.9 mL/min (A) (by C-G formula based on SCr of 1.64 mg/dL (H)). Liver Function Tests: Recent Labs  Lab 03/27/20 0001  AST 17  ALT 12  ALKPHOS 40  BILITOT 0.9  PROT 5.8*  ALBUMIN 3.5   No results for input(s): LIPASE, AMYLASE in the last 168 hours. No results for input(s): AMMONIA in the last 168 hours. Coagulation Profile: Recent Labs  Lab 03/27/20 0204  INR 4.7*   Cardiac Enzymes: No results for input(s): CKTOTAL, CKMB, CKMBINDEX, TROPONINI in the last 168 hours. BNP (last 3 results) Recent Labs    05/06/19 1201  PROBNP 431.0*   HbA1C: No results for input(s): HGBA1C in the last 72 hours. CBG: No results for input(s): GLUCAP in the last 168 hours. Lipid Profile: No results for input(s): CHOL, HDL, LDLCALC, TRIG, CHOLHDL, LDLDIRECT in the last 72 hours. Thyroid Function Tests: No results for input(s): TSH, T4TOTAL, FREET4, T3FREE, THYROIDAB in the last  72 hours. Anemia Panel: No results for input(s): VITAMINB12, FOLATE, FERRITIN, TIBC, IRON, RETICCTPCT in the last 72 hours. Urine analysis:    Component Value Date/Time   COLORURINE STRAW (A) 03/27/2020 0344   APPEARANCEUR  CLEAR 03/27/2020 0344   APPEARANCEUR Cloudy (A) 01/27/2018 1330   LABSPEC 1.009 03/27/2020 0344   PHURINE 7.0 03/27/2020 0344   GLUCOSEU NEGATIVE 03/27/2020 0344   HGBUR NEGATIVE 03/27/2020 0344   HGBUR large 11/16/2009 1202   BILIRUBINUR NEGATIVE 03/27/2020 0344   BILIRUBINUR neg 12/15/2019 1317   BILIRUBINUR Negative 01/27/2018 [place] 03/27/2020 0344   PROTEINUR NEGATIVE 03/27/2020 0344   UROBILINOGEN 0.2 12/15/2019 1317   UROBILINOGEN 1.0 08/16/2014 1754   NITRITE NEGATIVE 03/27/2020 0344   LEUKOCYTESUR LARGE (A) 03/27/2020 0344    Radiological Exams on Admission - Personally Reviewed: DG Chest Portable 1 View  Result Date: 03/27/2020 CLINICAL DATA:  eva EXAM: PORTABLE CHEST 1 VIEW COMPARISON:  02/25/2019 FINDINGS: Cardiac pacemaker. Cardiac enlargement. No vascular congestion or edema. No focal consolidation. There is blunting of both costophrenic angles, unchanged since prior study, consistent with recurrent effusions or pleural thickening. No pneumothorax. Scattered calcified granulomas in the lungs. Slight interstitial pattern to the lungs likely represents fibrosis. Calcification of the aorta. Mediastinal contours appear intact. Sclerosis in the proximal left humerus is unchanged, likely and chondroma. IMPRESSION: Cardiac enlargement. No evidence of active pulmonary disease. Chronic effusions or pleural thickening in the costophrenic angles. Electronically Signed   By: Lucienne Capers M.D.   On: 03/27/2020 00:32    EKG: Personally reviewed.  Rhythm is atrial flutter with variable AV block with heart rate of 74 bpm.  Intraventricular conduction delay noted.  No dynamic ST segment changes appreciated.  Assessment/Plan Principal  Problem:   Symptomatic anemia   Patient presenting with significant drop in hemoglobin, currently 6.2, down from 8.3 one-week ago  This anemia is likely the cause of the patient's severe generalized weakness and dyspnea on exertion.  Anemia is likely multifactorial secondary to bleeding superimposed on anemia of chronic disease.  Patient reports frequent black stools due to iron supplements, stool Hemoccult has been ordered and is pending although this is unlikely to change management.  Patient found to have a somewhat supratherapeutic INR 4.7 although I do not know how much of a role this is played in the patient's acute anemia.  2.5 mg of vitamin K have been administered by the emergency department staff.  Unit packed red blood cell transfusion has been ordered by the emergency room staff, will reassess after transfusion for symptomatic improvement  Monitoring hemoglobin hematocrit every 6 hours to ensure stability  Review of endoscopic work-up in October 2020 included a colonoscopy which revealed findings concerning for chronic ischemic colitis.  Abdomen is soft and benign however I am concerned whether this area may be extending or bleeding.  Will make patient n.p.o., asked gastroenterology to evaluate the patient and determine as to whether or not endoscopic work-up as an inpatient or outpatient is warranted if at all.  Active Problems:   Permanent atrial fibrillation   Holding Coumadin  Currently rate controlled  Monitor on telemetry    COPD (chronic obstructive pulmonary disease) (HCC)   No evidence of COPD exacerbation at this time    Chronic kidney disease, stage 3b (HCC)   Creatinine near baseline  Strict input and output monitoring  Monitoring renal function electrolytes with serial chemistries  Avoiding nephrotoxic agents if at all possible    Supratherapeutic INR   Patient found to have supratherapeutic INR in light of suspected bleed and symptomatic  anemia  2.5 mg of vitamin K already administered by emergency department staff    Hypothyroidism   Continue home regimen of Synthroid  Mixed hyperlipidemia    Continue home regimen of statin therapy  Code Status:  Full code Family Communication:  deferred  Status is: Observation  The patient remains OBS appropriate and will d/c before 2 midnights.  Dispo: The patient is from: Home              Anticipated d/c is to: Home              Anticipated d/c date is: 2 days              Patient currently is not medically stable to d/c.        Vernelle Emerald MD [facility] Pager [phone]  If 7PM-7AM, please contact night-coverage [email or web address] Use universal [place] password for that web site. If you do not have the password, please call the hospital operator.  03/27/2020, 6:37 AM

## 2020-03-27 NOTE — Progress Notes (Signed)
Patient ID: [name], female   DOB: 02-12-1934, 86 y.o.   MRN: [id number] Patient admitted early this morning for symptomatic anemia with supratherapeutic INR and has received vitamin K along with packed red cells transfusion.  GI consult has been requested.  Patient seen and examined at bedside.  I have reviewed patient medical records including this morning's H&P, current vitals, labs, medications myself.  Coumadin on hold.  Will request palliative care consultation for goals of care discussion.  We will also consult cardiology/heart failure team for help in management; intermittently complains of chest pain.  Repeat a.m. labs.

## 2020-03-27 NOTE — Consult Note (Addendum)
[facility] Consult: 10:17 AM 03/27/2020  LOS: 0 days    Referring Provider:  Dr Starla Link  Primary Care Physician:  Elby Beck, FNP Primary Gastroenterologist:  Dr. Ardis Hughs.       Reason for Consultation:  IDA.  Hgb 6.2.    HPI: [name] is a 86 y.o. female.  PMH A. fib.  Diastolic CHF.  Nonischemic cardiomyopathy.  S/p ICD.  PAD.  Chronic Coumadin.  COPD.  TIA.  CKD stage 3.    Kidney cancer, s/p right nephrectomy.  Recurrent UTIs.  Cholecystectomy 2014.  10/2018 left total hip replacement following traumatic femoral neck fracture.  Chronic anemia, despite chronic po bid and IV iron and B12 and. Transfused 2 RBCs in 2013, 2 units PRBCs 10/2018 in setting of hip replacement.  1 PRBC on 11/11/2018.  FOBT negative in 11/2018. 6 PRBCs late Oct/eraly Nov 2020.  Ferritin 13, normal iron and TIBC.  Followed by DR Janese Banks hematologist.  Received Shirlean Kelly several times this year, February, May, June, twice in September. Recieves Retacrit infusions, latest on 03/23/20.   Next Feraheme infusion is due 04/07/2020.  Last Vit B12 injection on 01/25/20.    10/2009 colonoscopy.  For minor rectal bleeding, Dr. Ardis Hughs.  Mild sigmoid, descending diverticulosis, otherwise normal study. 02/28/19 EGD: mild, H Pylori negative, gastrititis w erosions.   02/28/19 colonoscopy: changes of chronic ischemic colitis, diverticulosis.   03/15/19 Capsule endoscopy: Non-bleeding duodenal AVM, small streak blood downstream for this.   No further endoscopic evals.  Eventually restarted on Coumadin.    Presenting to ED yest w DOE, mild cough,  orthostatic/generalized weakness, angina w exertion.  For the past 10 days she has been lethargic, weak with some dizziness.  This has progressed to the point where she could hardly stand up over the  weekend.  She has had intermittent nausea and back pain.  Stools are chronically dark because she takes bid daily iron.  No PPI or H2 blocker, no NSAIDs at home. Hgb 6.2, down from 8.3 1 week ago.  INR 4.7.    + AKI Last week iron 39, tibc 353, ferritin 25: all WNL.  B12 and Folate WNL.   Port CXR: Chronic pleural effusions or pleural thickening at the costophrenic angles.  Cardiac enlargement.  Likely pulmonary fibrosis and scattered pulmonary granulomas.  No active cardiopulmonary disease.  2 PRBCs ordered along with posttransfusion CBC. Developed acute shortness of breath and some nausea without vomiting after the first unit of blood.  Received IV Lasix and will get Tylenol prior to second PRBC.  Patient has a lot of family support.  She lives with her 69 year old great grandson.  Quit smoking at least 20 years ago.  No alcohol.    Past Medical History:  Diagnosis Date  . Adjustment disorder with anxiety   . Anemia   . Anginal pain ([place])   . Arthritis    "some in my hands" (11/11/2012)  . Atrial fibrillation ([place])   . Automatic implantable cardioverter-defibrillator in situ   . Blood in stool   . Carotid  artery stenosis 09/2007   a. 09/2007: 60-79% bilateral (stable); b. 10/2008: 40-59% R 60-79%   . Chest pain, unspecified   . Chronic airway obstruction, not elsewhere classified   . Chronic bronchitis ([place])    "get it some; not q year" (11/11/2012)  . Chronic diastolic CHF (congestive heart failure) ([place]) 06/04/2013  . Chronic kidney disease, unspecified   . Coronary artery disease    non-obstructive by 2006 cath  . Frequent UTI    "get them a couple times/yr" (11/11/2012)  . Hemorrhage of rectum and anus   . High cholesterol   . History of blood transfusion 04/2011   "after hip OR" (11/11/2012)  . Hx of renal cell cancer   . Hypertension 05/20/2011  . Hypertr obst cardiomyop   . Hypotension, unspecified    cardiac cath 2006..nonobstructive CAD 30-40s lesions.Marland KitchenETT 1/09 nondiagnostic  due to poor HR response..Right Renal Cancer 2003  . Long term (current) use of anticoagulants   . Malignant neoplasm of kidney, except pelvis   . Osteoarthritis of right hip   . Other and unspecified coagulation defects   . PONV (postoperative nausea and vomiting)   . Renal cancer ([place]) 06/2001   Right  . Secondary cardiomyopathy, unspecified   . Sinus bradycardia   . Urge incontinence     Past Surgical History:  Procedure Laterality Date  . ABDOMINAL HYSTERECTOMY  1975   for benign causes  . APPENDECTOMY    . BI-VENTRICULAR PACEMAKER UPGRADE  05/04/2010  . BIOPSY  02/28/2019   Procedure: BIOPSY;  Surgeon: Milus Banister, MD;  Location: [facility] ENDOSCOPY;  Service: Endoscopy;;  . CARDIAC CATHETERIZATION  2006  . CARDIOVERSION N/A 02/20/2018   Procedure: CARDIOVERSION;  Surgeon: Minna Merritts, MD;  Location: [facility];  Service: Cardiovascular;  Laterality: N/A;  . CARDIOVERSION N/A 03/27/2018   Procedure: CARDIOVERSION;  Surgeon: Minna Merritts, MD;  Location: [facility] ORS;  Service: Cardiovascular;  Laterality: N/A;  . CATARACT EXTRACTION W/ INTRAOCULAR LENS  IMPLANT, BILATERAL  01/2006-02-2006  . CHOLECYSTECTOMY N/A 11/11/2012   Procedure: LAPAROSCOPIC CHOLECYSTECTOMY WITH INTRAOPERATIVE CHOLANGIOGRAM;  Surgeon: Imogene Burn. Georgette Dover, MD;  Location: [place];  Service: General;  Laterality: N/A;  . COLONOSCOPY WITH PROPOFOL N/A 02/28/2019   Procedure: COLONOSCOPY WITH PROPOFOL;  Surgeon: Milus Banister, MD;  Location: [facility] ENDOSCOPY;  Service: Endoscopy;  Laterality: N/A;  . EP IMPLANTABLE DEVICE N/A 02/21/2016   Procedure: ICD Generator Changeout;  Surgeon: Deboraha Sprang, MD;  Location: [facility];  Service: Cardiovascular;  Laterality: N/A;  . ESOPHAGOGASTRODUODENOSCOPY (EGD) WITH PROPOFOL N/A 02/28/2019   Procedure: ESOPHAGOGASTRODUODENOSCOPY (EGD) WITH PROPOFOL;  Surgeon: Milus Banister, MD;  Location: [facility] ENDOSCOPY;  Service: Endoscopy;  Laterality: N/A;  . GIVENS CAPSULE STUDY  N/A 03/15/2019   Procedure: GIVENS CAPSULE STUDY;  Surgeon: Thornton Park, MD;  Location: [facility];  Service: Gastroenterology;  Laterality: N/A;  . INSERT / REPLACE / REMOVE PACEMAKER  05-01-11   02-28-05-/05-04-10-ICD-MEDTRONIC MAXIMAL DR  . JOINT REPLACEMENT    . LAPAROSCOPIC CHOLECYSTECTOMY  11/11/2012  . LAPAROSCOPIC LYSIS OF ADHESIONS N/A 11/11/2012   Procedure: LAPAROSCOPIC LYSIS OF ADHESIONS;  Surgeon: Imogene Burn. Georgette Dover, MD;  Location: [place];  Service: General;  Laterality: N/A;  . NEPHRECTOMY Right 06/2001    S/P RENAL CELL CANCER  . PRESSURE SENSOR/CARDIOMEMS N/A 02/03/2019   Procedure: PRESSURE SENSOR/CARDIOMEMS;  Surgeon: Larey Dresser, MD;  Location: [place] [facility];  Service: Cardiovascular;  Laterality: N/A;  . RIGHT HEART CATH N/A 11/09/2018  Procedure: RIGHT HEART CATH;  Surgeon: Larey Dresser, MD;  Location: [place] [facility];  Service: Cardiovascular;  Laterality: N/A;  . RIGHT HEART CATH N/A 03/08/2019   Procedure: RIGHT HEART CATH;  Surgeon: Larey Dresser, MD;  Location: [place] [facility];  Service: Cardiovascular;  Laterality: N/A;  . SUBMUCOSAL TATTOO INJECTION  02/28/2019   Procedure: SUBMUCOSAL TATTOO INJECTION;  Surgeon: Milus Banister, MD;  Location: [facility] ENDOSCOPY;  Service: Endoscopy;;  . TOTAL HIP ARTHROPLASTY Right 05/03/2011   Procedure: TOTAL HIP ARTHROPLASTY ANTERIOR APPROACH;  Surgeon: Mcarthur Rossetti;  Location: [facility];  Service: Orthopedics;  Laterality: Right;  Removal of Cannulated Screws Right Hip, Right Direct Anterior Hip Replacement  . TOTAL HIP ARTHROPLASTY Left 10/09/2018   Procedure: TOTAL HIP ARTHROPLASTY ANTERIOR APPROACH;  Surgeon: Rod Can, MD;  Location: [place];  Service: Orthopedics;  Laterality: Left;    Prior to Admission medications   Medication Sig Start Date End Date Taking? Authorizing Provider  acetaminophen (TYLENOL) 500 MG tablet Take 500-1,000 mg by mouth every 8 (eight) hours as needed for mild pain or  headache.    Yes [provider]  cetirizine (ZYRTEC) 10 MG tablet Take 10 mg by mouth daily.    Yes [provider]  Cholecalciferol (VITAMIN D) 50 MCG (2000 UT) CAPS Take 4,000 Units by mouth daily.   Yes [provider]  conjugated estrogens (PREMARIN) vaginal cream Place 1 Applicatorful vaginally daily. Patient taking differently: Place 1 Applicatorful vaginally at bedtime.  07/31/18  Yes Elby Beck, FNP  Cranberry (AZO CRANBERRY GUMMIES) 500 MG CHEW Chew 2,000 mg by mouth daily.   Yes [provider]  Cyanocobalamin (B-12 PO) Take 1 tablet by mouth daily.    Yes [provider]  ezetimibe (ZETIA) 10 MG tablet Take 1 tablet (10 mg total) by mouth daily. 08/18/17  Yes Minna Merritts, MD  ferrous sulfate 325 (65 FE) MG tablet Take 1 tablet (325 mg total) by mouth 2 (two) times daily with a meal. 03/17/19  Yes Adhikari, Amrit, MD  fluticasone (FLONASE) 50 MCG/ACT nasal spray Place 2 sprays into both nostrils daily as needed for allergies or rhinitis. 07/27/19  Yes Elby Beck, FNP  levothyroxine (SYNTHROID) 25 MCG tablet TAKE 1 TABLET BY MOUTH ONCE DAILY BEFOREBREAKFAST Patient taking differently: Take 25 mcg by mouth daily before breakfast.  04/19/19  Yes Deboraha Sprang, MD  magnesium oxide (MAG-OX) 400 MG tablet Take 1 tablet (400 mg total) by mouth daily. 06/05/18  Yes Deboraha Sprang, MD  metolazone (ZAROXOLYN) 2.5 MG tablet Take 1 tablet (2.5 mg total) by mouth 2 (two) times a week. On mondays and thursdays 02/24/20  Yes Larey Dresser, MD  potassium chloride SA (KLOR-CON) 20 MEQ tablet Take 2 tablets (40 mEq total) by mouth 2 (two) times daily. 1 extra tab on metolazone days 11/30/19  Yes Clegg, Amy D, NP  rosuvastatin (CRESTOR) 40 MG tablet Take 1 tablet (40 mg total) by mouth daily. 11/11/18  Yes Larey Dresser, MD  torsemide (DEMADEX) 100 MG tablet Take 1 tablet (100 mg total) by mouth 2 (two) times daily. 12/13/19  Yes Larey Dresser, MD  vitamin C (ASCORBIC ACID) 250 MG tablet Take 500 mg by mouth daily.   Yes [provider]  warfarin (COUMADIN) 2 MG tablet TAKE 1 TABLETS BY MOUTH DAILY AT 6PM AS DIRECTED BY ANTICOAGULATION CLINIC. Patient taking differently: Take 2-3 mg by mouth See admin instructions. 2  mg on Wednesday, 3 mg the rest of the days 03/01/20  Yes Elby Beck, FNP    Scheduled Meds: . sodium chloride   Intravenous Once  . ezetimibe  10 mg Oral Daily  . levothyroxine  25 mcg Oral Q0600  . pantoprazole (PROTONIX) IV  40 mg Intravenous Q12H  . rosuvastatin  20 mg Oral Daily   Infusions:  PRN Meds: acetaminophen **OR** acetaminophen, ondansetron **OR** ondansetron (ZOFRAN) IV, polyethylene glycol   Allergies as of 03/26/2020 - Review Complete 03/23/2020  Allergen Reaction Noted  . Dilaudid [hydromorphone] Other (See Comments) 11/06/2018  . Fentanyl Other (See Comments) 02/05/2019  . Codeine Nausea And Vomiting   . Morphine and related Nausea And Vomiting 11/04/2012  . Sulfonamide derivatives Other (See Comments)     Family History  Problem Relation Age of Onset  . Heart failure Mother   . Breast cancer Maternal Aunt 82  . Breast cancer Cousin   . Breast cancer Other   . Colon cancer Neg Hx     Social History   Socioeconomic History  . Marital status: Widowed    Spouse name: Not on file  . Number of children: Not on file  . Years of education: Not on file  . Highest education level: Not on file  Occupational History  . Occupation: Retired    Fish farm manager: RETIRED  Tobacco Use  . Smoking status: Former Smoker    Packs/day: 0.50    Years: 40.00    Pack years: 20.00    Types: Cigarettes    Quit date: 05/06/2001    Years since quitting: 18.9  . Smokeless tobacco: Never Used  Vaping Use  . Vaping Use: Never used  Substance and Sexual Activity  . Alcohol use: No    Alcohol/week: 0.0 standard drinks  . Drug use: No  . Sexual activity: Never  Other Topics  Concern  . 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   ALCOHOL USE-NO   RETIRED   FORMER TOBACCO USE.....1/2 PACK X 86 YRS UNTIL 2003   Would desire CPR   Social Determinants of Health   Financial Resource Strain:   . Difficulty of Paying Living Expenses: Not on file  Food Insecurity:   . Worried About Charity fundraiser in the Last Year: Not on file  . Ran Out of Food in the Last Year: Not on file  Transportation Needs:   . Lack of Transportation (Medical): Not on file  . Lack of Transportation (Non-Medical): Not on file  Physical Activity:   . Days of Exercise per Week: Not on file  . Minutes of Exercise per Session: Not on file  Stress:   . Feeling of Stress : Not on file  Social Connections:   . Frequency of Communication with Friends and Family: Not on file  . Frequency of Social Gatherings with Friends and Family: Not on file  . Attends Religious Services: Not on file  . Active Member of Clubs or Organizations: Not on file  . Attends Archivist Meetings: Not on file  . Marital Status: Not on file  Intimate Partner Violence:   . Fear of Current or Ex-Partner: Not on file  . Emotionally Abused: Not on file  . Physically Abused: Not on file  . Sexually Abused: Not  on file    REVIEW OF SYSTEMS: Constitutional: See HPI ENT:  No nose bleeds Pulm: Shortness of breath is stable.  Occasional nonproductive cough. CV:  No palpitations, no LE edema.  Angina with exertion is mild.  Some claudication but this has not been an issue since she is not walking around much. GU:  No hematuria, no frequency GI: No dysphagia.  No bloody emesis.  No change of bowel habits. Heme: Denies excessive or unusual bleeding or bruising. Transfusions: See HPI.  Last PRBCs given were in October 2020 with 2 units ordered  currently. Neuro:  No headaches, no peripheral tingling or numbness.  No seizures.  No syncope. Derm:  No itching, no rash or sores.  Endocrine:  No sweats or chills.  No polyuria or dysuria Immunization: Not queried. Travel:  None beyond local counties in last few months.    PHYSICAL EXAM: Vital signs in last 24 hours: Vitals:   03/27/20 0954 03/27/20 1000  BP: (!) 139/43 (!) 118/47  Pulse: 75 (!) 50  Resp: (!) 27 (!) 34  Temp: 98.1 F (36.7 C)   SpO2:  (!) 75%   Wt Readings from Last 3 Encounters:  03/26/20 65.8 kg  03/23/20 65.8 kg  03/06/20 67.1 kg    General: Chronically ill, frail, tired appearing.  Alert and able to provide history. Head: No signs of head trauma.  No facial asymmetry or swelling Eyes: Conjunctiva pale.  No scleral icterus. Ears: Not hard of hearing Nose: No congestion or discharge Mouth: Dentition and dental work in Librarian, academic.  Tongue midline.  Mucosa is moist, pink, clear. Neck: No JVD, no masses, no thyromegaly. Lungs: Somewhat diminished breath sounds.  Some coarse rales in the left base.  No dyspnea or cough. Heart: Pacemaker/ICD visible below skin upper left sternum.  Irregularly irregular, no tachycardia or bradycardia.  No MRG.  S1, S2 present Abdomen: Soft without tenderness.  Active bowel sounds.  No distention.  No HSM, masses, bruits, hernias.   Rectal: Deferred Musc/Skeltl: Osteoporotic appearing no gross joint swelling or redness. Extremities: No CCE. Neurologic: Alert.  Oriented x3.  Good historian.  No tremors.  Moves all 4 limbs, strength not tested. Skin: Pale.  Some purpura on the arms. Nodes: No cervical adenopathy Psych: Pleasant, calm, cooperative.  Fluid speech.  Intake/Output from previous day: No intake/output data recorded. Intake/Output this shift: Total I/O In: 815 [Blood:815] Out: -   LAB RESULTS: Recent Labs    03/27/20 0001  WBC 5.1  HGB 6.2*  HCT 21.7*  PLT 262   BMET Lab Results  Component Value  Date   NA 135 03/27/2020   NA 139 02/23/2020   NA 139 02/09/2020   K 4.6 03/27/2020   K 3.7 02/23/2020   K 4.0 02/09/2020   CL 98 03/27/2020   CL 99 02/23/2020   CL 92 (L) 02/09/2020   CO2 26 03/27/2020   CO2 30 02/23/2020   CO2 37 (H) 02/09/2020   GLUCOSE 108 (H) 03/27/2020   GLUCOSE 150 (H) 02/23/2020   GLUCOSE 87 02/09/2020   BUN 54 (H) 03/27/2020   BUN 33 (H) 02/23/2020   BUN 52 (H) 02/09/2020   CREATININE 1.64 (H) 03/27/2020   CREATININE 1.32 (H) 02/23/2020   CREATININE 1.83 (H) 02/09/2020   CALCIUM 8.8 (L) 03/27/2020   CALCIUM 9.5 02/23/2020   CALCIUM 9.9 02/09/2020   LFT Recent Labs    03/27/20 0001  PROT 5.8*  ALBUMIN 3.5  AST 17  ALT 12  ALKPHOS 40  BILITOT 0.9   PT/INR Lab Results  Component Value Date   INR 4.7 (HH) 03/27/2020   INR 2.8 (H) 03/06/2020   INR 2.8 03/06/2020   Hepatitis Panel No results for input(s): HEPBSAG, HCVAB, HEPAIGM, HEPBIGM in the last 72 hours. C-Diff No components found for: CDIFF Lipase     Component Value Date/Time   LIPASE 24.0 09/01/2012 1215    Drugs of Abuse  No results found for: LABOPIA, COCAINSCRNUR, LABBENZ, AMPHETMU, THCU, LABBARB   RADIOLOGY STUDIES: DG Chest Portable 1 View  Result Date: 03/27/2020 CLINICAL DATA:  eva EXAM: PORTABLE CHEST 1 VIEW COMPARISON:  02/25/2019 FINDINGS: Cardiac pacemaker. Cardiac enlargement. No vascular congestion or edema. No focal consolidation. There is blunting of both costophrenic angles, unchanged since prior study, consistent with recurrent effusions or pleural thickening. No pneumothorax. Scattered calcified granulomas in the lungs. Slight interstitial pattern to the lungs likely represents fibrosis. Calcification of the aorta. Mediastinal contours appear intact. Sclerosis in the proximal left humerus is unchanged, likely and chondroma. IMPRESSION: Cardiac enlargement. No evidence of active pulmonary disease. Chronic effusions or pleural thickening in the costophrenic  angles. Electronically Signed   By: Lucienne Capers M.D.   On: 03/27/2020 00:32    IMPRESSION:   *   Recurrent acute on chronic anemia Assumed secondary to GI blood loss though has not had any symptoms of overt blood loss. Stools chronically dark with bid iron. Underwent colonoscopy, EGD and capsule endoscopy in October/November 2020.  Suspect blood loss intermittently from small bowel AVMs.  Followed closely by hematology and receives frequent Feraheme, CSA infusions.  *    Chronic Coumadin for A. Fib. INR supratherapeutic at 4.7.    PLAN:     *   Per Dr Bryan Lemma.    *    Allow heart healthy diet.  No plans to scope the patient today given supratherapeutic INR.   Patient herself is hesitant to undergo further testing.  *   Hold Coumadin.   Is it time to re-visit risk/benefits of oral AC?    *    No need for IV bid Protonix.  Discontinued this.  Given she is having some nausea will substitute with Protonix 40 po daily   Azucena Freed  03/27/2020, 10:17 AM Phone [phone]   Attending physician's note   I have taken an interval history, reviewed the chart and examined the patient. I agree with the Advanced Practitioner's note, impression and recommendations.   Acute on chronic anemia with FOBT + dark stools. Hb 6.2, being transfused 2U. Baseline Hb 8.2, a week ago.  Pt on chronic iron therapy (IV/PO), s/p multiple blood transfusions in past. . Prev colon 02/2019 with ischemic colitis, neg EGD but VCE showing duodenal and SB AVMs.  A. Fib/CHF on coumadin with supratherapeutic INR 4.7, given vitamin K.  Coumadin on hold.  Associated CKD3, PAD, COPD, TIA, kidney cancer s/p R nephrectomy  Plan: -Trend CBC, INR. -Agree with cardiology consultation.  -Recommend push enteroscopy and rpt colon once INR<1.5 and cleared by cardiology. Pt not keen on getting endo procedures at this time. Wants to be home for Thanksgiving.  Will discuss again tomorrow. -Continue Protonix -Will  discuss with Dr. Bryan Lemma.    Carmell Austria, MD [facility] [phone]

## 2020-03-27 NOTE — ED Notes (Signed)
Notified MD of pt's elevated temp during blood infusion. Transfusion stopped in the interim awaiting reply.Rechecked temp again and received 99.3. Tylenol administered. MD responded and told to resume the transfusion. Will keep monitoring.

## 2020-03-27 NOTE — ED Notes (Signed)
Daughter at bedside.  Assisted pt with am care.  Pt is alert and oriented  No pain

## 2020-03-27 NOTE — Progress Notes (Signed)
ANTICOAGULATION CONSULT NOTE - Initial Consult  Pharmacy Consult for warfarin - hold  Indication: atrial fibrillation  Allergies  Allergen Reactions  . Dilaudid [Hydromorphone] Other (See Comments)    Excessive Somnolence- Required Narcan  . Fentanyl Other (See Comments)    Required Narcan when given with Dilaudid. Tolerated single dose when given during procedure 02/03/2019.   Marland Kitchen Codeine Nausea And Vomiting    Hallucinations   . Morphine And Related Nausea And Vomiting    Hallucinations   . Sulfonamide Derivatives Other (See Comments)    Dry mouth    Patient Measurements: Height: 5\' 6"  (167.6 cm) Weight: 65.8 kg (145 lb) IBW/kg (Calculated) : 59.3   Vital Signs: Temp: 99.3 F (37.4 C) (11/22 1025) Temp Source: Oral (11/22 1025) BP: 97/49 (11/22 1415) Pulse Rate: 72 (11/22 1415)  Labs: Recent Labs    03/27/20 0001 03/27/20 0204  HGB 6.2*  --   HCT 21.7*  --   PLT 262  --   LABPROT  --  43.0*  INR  --  4.7*  CREATININE 1.64*  --   TROPONINIHS 28* 26*    Estimated Creatinine Clearance: 23.9 mL/min (A) (by C-G formula based on SCr of 1.64 mg/dL (H)).   Medical History: Past Medical History:  Diagnosis Date  . Adjustment disorder with anxiety   . Anemia   . Anginal pain ([place])   . Arthritis    "some in my hands" (11/11/2012)  . Atrial fibrillation ([place])   . Automatic implantable cardioverter-defibrillator in situ   . Blood in stool   . Carotid artery stenosis 09/2007   a. 09/2007: 60-79% bilateral (stable); b. 10/2008: 40-59% R 60-79%   . Chest pain, unspecified   . Chronic airway obstruction, not elsewhere classified   . Chronic bronchitis ([place])    "get it some; not q year" (11/11/2012)  . Chronic diastolic CHF (congestive heart failure) ([place]) 06/04/2013  . Chronic kidney disease, unspecified   . Coronary artery disease    non-obstructive by 2006 cath  . Frequent UTI    "get them a couple times/yr" (11/11/2012)  . Hemorrhage of rectum and anus   . High  cholesterol   . History of blood transfusion 04/2011   "after hip OR" (11/11/2012)  . Hx of renal cell cancer   . Hypertension 05/20/2011  . Hypertr obst cardiomyop   . Hypotension, unspecified    cardiac cath 2006..nonobstructive CAD 30-40s lesions.Marland KitchenETT 1/09 nondiagnostic due to poor HR response..Right Renal Cancer 2003  . Long term (current) use of anticoagulants   . Malignant neoplasm of kidney, except pelvis   . Osteoarthritis of right hip   . Other and unspecified coagulation defects   . PONV (postoperative nausea and vomiting)   . Renal cancer ([place]) 06/2001   Right  . Secondary cardiomyopathy, unspecified   . Sinus bradycardia   . Urge incontinence     Assessment: [name] Hx Afib on warfarin PTA  admitted with weakness and SOB found to have INR 4.7, hgb 6.2.  Holding warfarin for now  Replace PRBC and will follow for resume anticoagulation.   Goal of Therapy:  INR 2-3 Monitor platelets by anticoagulation protocol: Yes   Plan:  Hold warfarin for now  Daily CBC , protime Follow up plan  Monitor s/s bleeding    Bonnita Nasuti Pharm.D. CPP, BCPS Clinical Pharmacist [phone] 03/27/2020 2:28 PM

## 2020-03-27 NOTE — ED Notes (Signed)
Date and time results received: 03/27/20 0308 (use smartphrase ".now" to insert current time)  Test: INR Critical Value: 4.7  Name of Provider Notified: Mesner, MD  Orders Received? Or Actions Taken?:

## 2020-03-27 NOTE — ED Notes (Signed)
Date and time results received: 03/27/20 0049 (use smartphrase ".now" to insert current time)  Test: Hemoglobin Critical Value: 6.2  Name of Provider Notified: Mesner, MD  Orders Received? Or Actions Taken?:

## 2020-03-27 NOTE — ED Notes (Signed)
Pt point of contact [name]-- daughter-- [phone]

## 2020-03-27 NOTE — Progress Notes (Signed)
  Echocardiogram 2D Echocardiogram has been performed.  Fidel Levy 03/27/2020, 3:03 PM

## 2020-03-27 NOTE — ED Provider Notes (Signed)
[facility] EMERGENCY DEPARTMENT Provider Note   CSN: [id number] Arrival date & time: 03/26/20  2252     History Chief Complaint  Patient presents with  . Chest Pain    [name] is a 86 y.o. female.  86 year old female with multiple past medical problems to include A. fib on Coumadin, heart failure with a ICD but on metolazone and torsemide, iron deficiency anemia get iron infusions, chronic kidney disease amongst others.  Patient presents to the emergency department today with multiple complaints.  She thinks she is probably low on her iron or her hemoglobin.  She required multiple transfusions in the past.  She states that she has had dyspnea on exertion, orthostatic weakness, generalized weakness and chest tightness when she exerts herself too much.  Has not noticed any blood in her stools or any other blood loss anywhere else.  She is scheduled for an iron transfusion in the next couple weeks. No nausea, vomiting, diarrhea, fever. Has had mild non productive cough.    Chest Pain      Past Medical History:  Diagnosis Date  . Adjustment disorder with anxiety   . Anemia   . Anginal pain ([place])   . Arthritis    "some in my hands" (11/11/2012)  . Atrial fibrillation ([place])   . Automatic implantable cardioverter-defibrillator in situ   . Blood in stool   . Carotid artery stenosis 09/2007   a. 09/2007: 60-79% bilateral (stable); b. 10/2008: 40-59% R 60-79%   . Chest pain, unspecified   . Chronic airway obstruction, not elsewhere classified   . Chronic bronchitis ([place])    "get it some; not q year" (11/11/2012)  . Chronic diastolic CHF (congestive heart failure) ([place]) 06/04/2013  . Chronic kidney disease, unspecified   . Coronary artery disease    non-obstructive by 2006 cath  . Frequent UTI    "get them a couple times/yr" (11/11/2012)  . Hemorrhage of rectum and anus   . High cholesterol   . History of blood transfusion 04/2011   "after hip OR" (11/11/2012)   . Hx of renal cell cancer   . Hypertension 05/20/2011  . Hypertr obst cardiomyop   . Hypotension, unspecified    cardiac cath 2006..nonobstructive CAD 30-40s lesions.Marland KitchenETT 1/09 nondiagnostic due to poor HR response..Right Renal Cancer 2003  . Long term (current) use of anticoagulants   . Malignant neoplasm of kidney, except pelvis   . Osteoarthritis of right hip   . Other and unspecified coagulation defects   . PONV (postoperative nausea and vomiting)   . Renal cancer ([place]) 06/2001   Right  . Secondary cardiomyopathy, unspecified   . Sinus bradycardia   . Urge incontinence     Patient Active Problem List   Diagnosis Date Noted  . Iron deficiency anemia 01/06/2020  . Cellulitis of left leg 11/11/2019  . History of GI bleed 11/11/2019  . Chronic venous hypertension w ulceration ([place])   . Melena   . AKI (acute kidney injury) ([place]) 03/03/2019  . Symptomatic anemia 02/25/2019  . Acute on chronic diastolic CHF (congestive heart failure), NYHA class 4 ([place]) 11/04/2018  . Acute blood loss anemia 11/04/2018  . Chronic bilateral pleural effusions 11/04/2018  . Shortness of breath 10/21/2018  . Displaced fracture of left femoral neck ([place]) 10/09/2018  . Somnolence   . Closed left hip fracture ([place]) 10/07/2018  . Fall   . Hypokalemia   . Afib ([place]) 06/01/2018  . Urinary frequency 09/18/2017  . Iliac  artery stenosis, bilateral ([place]) 07/14/2017  . Recurrent UTI 07/14/2017  . Long term (current) use of anticoagulants 05/08/2017  . Anemia 11/18/2016  . Dysuria 11/18/2016  . Pulmonary hypertension ([place]) 12/04/2015  . B12 deficiency 05/22/2015  . PAD (peripheral artery disease) ([place])   . Elevated serum creatinine   . Renal cell carcinoma ([place])   . History of nephrectomy secondary to [place]   . TIA (transient ischemic attack) 08/16/2014  . Weakness 01/14/2014  . Chronic kidney disease (CKD), stage IV (severe) ([place]) 12/08/2013  . Preoperative cardiovascular examination 06/07/2013  .  Chronic diastolic CHF (congestive heart failure) ([place]) 06/04/2013  . Mitral valve regurgitation 06/04/2013  . Allergic rhinitis 01/01/2013  . Chronic cholecystitis with calculus 09/15/2012  . Claudication ([place]) 01/21/2012  . Intermittent lightheadedness 11/26/2011  . Hypertension 05/20/2011  . Avascular necrosis of hip ([place]) 05/03/2011  . OTHER AND UNSPECIFIED COAGULATION DEFECTS 10/13/2009  . RECTAL BLEEDING 10/13/2009  . HOCM / IHSS 07/14/2009  . EDEMA 07/14/2009  . Hyperlipidemia 04/10/2009  . ADJUSTMENT DISORDER WITH ANXIETY 04/10/2009  . INCONTINENCE, URGE 04/10/2009  . Non-ischemic cardiomyopathy ([place]) 10/27/2008  . Permanent atrial fibrillation 10/27/2008  . SINUS BRADYCARDIA 10/27/2008  . Carotid artery stenosis 10/27/2008  . COPD (chronic obstructive pulmonary disease) ([place]) 10/27/2008  . CKD (chronic kidney disease) stage 3, GFR 30-59 ml/min (H[facility]) 10/27/2008  . ICD (implantable cardioverter-defibrillator) in place 10/27/2008    Past Surgical History:  Procedure Laterality Date  . ABDOMINAL HYSTERECTOMY  1975   for benign causes  . APPENDECTOMY    . BI-VENTRICULAR PACEMAKER UPGRADE  05/04/2010  . BIOPSY  02/28/2019   Procedure: BIOPSY;  Surgeon: Milus Banister, MD;  Location: [facility] ENDOSCOPY;  Service: Endoscopy;;  . CARDIAC CATHETERIZATION  2006  . CARDIOVERSION N/A 02/20/2018   Procedure: CARDIOVERSION;  Surgeon: Minna Merritts, MD;  Location: [facility] ORS;  Service: Cardiovascular;  Laterality: N/A;  . CARDIOVERSION N/A 03/27/2018   Procedure: CARDIOVERSION;  Surgeon: Minna Merritts, MD;  Location: [facility] ORS;  Service: Cardiovascular;  Laterality: N/A;  . CATARACT EXTRACTION W/ INTRAOCULAR LENS  IMPLANT, BILATERAL  01/2006-02-2006  . CHOLECYSTECTOMY N/A 11/11/2012   Procedure: LAPAROSCOPIC CHOLECYSTECTOMY WITH INTRAOPERATIVE CHOLANGIOGRAM;  Surgeon: Imogene Burn. Georgette Dover, MD;  Location: [facility];  Service: General;  Laterality: N/A;  . COLONOSCOPY WITH PROPOFOL N/A 02/28/2019    Procedure: COLONOSCOPY WITH PROPOFOL;  Surgeon: Milus Banister, MD;  Location[facility] ENDOSCOPY;  Service: Endoscopy;  Laterality: N/A;  . EP IMPLANTABLE DEVICE N/A 02/21/2016   Procedure: ICD Generator Changeout;  Surgeon: Deboraha Sprang, MD;  Location: [facility] CV LAB;  Service: Cardiovascular;  Laterality: N/A;  . ESOPHAGOGASTRODUODENOSCOPY (EGD) WITH PROPOFOL N/A 02/28/2019   Procedure: ESOPHAGOGASTRODUODENOSCOPY (EGD) WITH PROPOFOL;  Surgeon: Milus Banister, MD;  Location: [facility] ENDOSCOPY;  Service: Endoscopy;  Laterality: N/A;  . GIVENS CAPSULE STUDY N/A 03/15/2019   Procedure: GIVENS CAPSULE STUDY;  Surgeon: Thornton Park, MD;  Location: [facility];  Service: Gastroenterology;  Laterality: N/A;  . INSERT / REPLACE / REMOVE PACEMAKER  05-01-11   02-28-05-/05-04-10-ICD-MEDTRONIC MAXIMAL DR  . JOINT REPLACEMENT    . LAPAROSCOPIC CHOLECYSTECTOMY  11/11/2012  . LAPAROSCOPIC LYSIS OF ADHESIONS N/A 11/11/2012   Procedure: LAPAROSCOPIC LYSIS OF ADHESIONS;  Surgeon: Imogene Burn. Georgette Dover, MD;  Location: [facility];  Service: General;  Laterality: N/A;  . NEPHRECTOMY Right 06/2001    S/P RENAL CELL CANCER  . PRESSURE SENSOR/CARDIOMEMS N/A 02/03/2019   Procedure: PRESSURE SENSOR/CARDIOMEMS;  Surgeon: Larey Dresser, MD;  Location:  [facility] INVASIVE CV LAB;  Service: Cardiovascular;  Laterality: N/A;  . RIGHT HEART CATH N/A 11/09/2018   Procedure: RIGHT HEART CATH;  Surgeon: Larey Dresser, MD;  Location: [facility] CV LAB;  Service: Cardiovascular;  Laterality: N/A;  . RIGHT HEART CATH N/A 03/08/2019   Procedure: RIGHT HEART CATH;  Surgeon: Larey Dresser, MD;  Location: [place] CV LAB;  Service: Cardiovascular;  Laterality: N/A;  . SUBMUCOSAL TATTOO INJECTION  02/28/2019   Procedure: SUBMUCOSAL TATTOO INJECTION;  Surgeon: Milus Banister, MD;  Location: [facility] ENDOSCOPY;  Service: Endoscopy;;  . TOTAL HIP ARTHROPLASTY Right 05/03/2011   Procedure: TOTAL HIP ARTHROPLASTY ANTERIOR APPROACH;  Surgeon:  Mcarthur Rossetti;  Location: [facility] ORS;  Service: Orthopedics;  Laterality: Right;  Removal of Cannulated Screws Right Hip, Right Direct Anterior Hip Replacement  . TOTAL HIP ARTHROPLASTY Left 10/09/2018   Procedure: TOTAL HIP ARTHROPLASTY ANTERIOR APPROACH;  Surgeon: Rod Can, MD;  Location: [facility];  Service: Orthopedics;  Laterality: Left;     OB History   No obstetric history on file.     Family History  Problem Relation Age of Onset  . Heart failure Mother   . Breast cancer Maternal Aunt 82  . Breast cancer Cousin   . Breast cancer Other   . Colon cancer Neg Hx     Social History   Tobacco Use  . Smoking status: Former Smoker    Packs/day: 0.50    Years: 40.00    Pack years: 20.00    Types: Cigarettes    Quit date: 05/06/2001    Years since quitting: 18.9  . Smokeless tobacco: Never Used  Vaping Use  . Vaping Use: Never used  Substance Use Topics  . Alcohol use: No    Alcohol/week: 0.0 standard drinks  . Drug use: No    Home Medications Prior to Admission medications   Medication Sig Start Date End Date Taking? Authorizing Provider  acetaminophen (TYLENOL) 500 MG tablet Take 500-1,000 mg by mouth every 8 (eight) hours as needed for mild pain or headache.     [provider]  cetirizine (ZYRTEC) 10 MG tablet Take 10 mg by mouth daily.     [provider]  Cholecalciferol (VITAMIN D) 50 MCG (2000 UT) CAPS Take 4,000 Units by mouth daily.    [provider]  conjugated estrogens (PREMARIN) vaginal cream Place 1 Applicatorful vaginally daily. 07/31/18   Elby Beck, FNP  Cranberry (AZO CRANBERRY GUMMIES) 500 MG CHEW Chew 2,000 mg by mouth daily.    [provider]  Cyanocobalamin (B-12 PO) Take by mouth daily.    [provider]  ezetimibe (ZETIA) 10 MG tablet Take 1 tablet (10 mg total) by mouth daily. 08/18/17   Minna Merritts, MD  ferrous sulfate 325 (65 FE) MG tablet Take 1 tablet (325 mg total) by mouth 2  (two) times daily with a meal. 03/17/19   Shelly Coss, MD  fluticasone (FLONASE) 50 MCG/ACT nasal spray Place 2 sprays into both nostrils daily as needed for allergies or rhinitis. 07/27/19   Elby Beck, FNP  levothyroxine (SYNTHROID) 25 MCG tablet TAKE 1 TABLET BY MOUTH ONCE DAILY BEFOREBREAKFAST 04/19/19   Deboraha Sprang, MD  magnesium oxide (MAG-OX) 400 MG tablet Take 1 tablet (400 mg total) by mouth daily. 06/05/18   Deboraha Sprang, MD  metolazone (ZAROXOLYN) 2.5 MG tablet Take 1 tablet (2.5 mg total) by mouth 2 (two) times a week. On mondays and thursdays  02/24/20   Larey Dresser, MD  potassium chloride SA (KLOR-CON) 20 MEQ tablet Take 2 tablets (40 mEq total) by mouth 2 (two) times daily. 1 extra tab on metolazone days 11/30/19   Clegg, Amy D, NP  rosuvastatin (CRESTOR) 40 MG tablet Take 1 tablet (40 mg total) by mouth daily. 11/11/18   Larey Dresser, MD  torsemide (DEMADEX) 100 MG tablet Take 1 tablet (100 mg total) by mouth 2 (two) times daily. 12/13/19   Larey Dresser, MD  vitamin C (ASCORBIC ACID) 250 MG tablet Take 500 mg by mouth daily.    [provider]  warfarin (COUMADIN) 2 MG tablet TAKE 1 TABLETS BY MOUTH DAILY AT 6PM AS DIRECTED BY ANTICOAGULATION CLINIC. 03/01/20   Elby Beck, FNP    Allergies    Dilaudid [hydromorphone], Fentanyl, Codeine, Morphine and related, and Sulfonamide derivatives  Review of Systems   Review of Systems  Cardiovascular: Positive for chest pain.  All other systems reviewed and are negative.   Physical Exam Updated Vital Signs BP (!) 140/56   Pulse 65   Temp 98.2 F (36.8 C) (Oral)   Resp (!) 22   Ht 5\' 6"  (1.676 m)   Wt 65.8 kg   SpO2 100%   BMI 23.40 kg/m   Physical Exam  ED Results / Procedures / Treatments   Labs (all labs ordered are listed, but only abnormal results are displayed) Labs Reviewed  CBC WITH DIFFERENTIAL/PLATELET - Abnormal; Notable for the following components:      Result Value    RBC 2.33 (*)    Hemoglobin 6.2 (*)    HCT 21.7 (*)    MCHC 28.6 (*)    RDW 19.5 (*)    nRBC 0.4 (*)    All other components within normal limits  COMPREHENSIVE METABOLIC PANEL  URINALYSIS, ROUTINE W REFLEX MICROSCOPIC  BRAIN NATRIURETIC PEPTIDE  TYPE AND SCREEN  TROPONIN I (HIGH SENSITIVITY)    EKG None  Radiology DG Chest Portable 1 View  Result Date: 03/27/2020 CLINICAL DATA:  eva EXAM: PORTABLE CHEST 1 VIEW COMPARISON:  02/25/2019 FINDINGS: Cardiac pacemaker. Cardiac enlargement. No vascular congestion or edema. No focal consolidation. There is blunting of both costophrenic angles, unchanged since prior study, consistent with recurrent effusions or pleural thickening. No pneumothorax. Scattered calcified granulomas in the lungs. Slight interstitial pattern to the lungs likely represents fibrosis. Calcification of the aorta. Mediastinal contours appear intact. Sclerosis in the proximal left humerus is unchanged, likely and chondroma. IMPRESSION: Cardiac enlargement. No evidence of active pulmonary disease. Chronic effusions or pleural thickening in the costophrenic angles. Electronically Signed   By: Lucienne Capers M.D.   On: 03/27/2020 00:32    Procedures .Critical Care Performed by: Merrily Pew, MD Authorized by: Merrily Pew, MD   Critical care provider statement:    Critical care time (minutes):  45   Critical care was necessary to treat or prevent imminent or life-threatening deterioration of the following conditions:  Circulatory failure   Critical care was time spent personally by me on the following activities:  Discussions with consultants, evaluation of patient's response to treatment, examination of patient, ordering and performing treatments and interventions, ordering and review of laboratory studies, ordering and review of radiographic studies, pulse oximetry, re-evaluation of patient's condition, obtaining history from patient or surrogate and review of old  charts   (including critical care time)  Medications Ordered in ED Medications - No data to display  ED Course  I have reviewed  the triage vital signs and the nursing notes.  Pertinent labs & imaging results that were available during my care of the patient were reviewed by me and considered in my medical decision making (see chart for details).    MDM Rules/Calculators/A&P                          86 year old here with acute blood loss anemia is symptomatic.  Transfusion started in the ER and will be admitted for the same.  Lasix given with the transfusion that she does have history of heart failure. Final Clinical Impression(s) / ED Diagnoses Final diagnoses:  None    Rx / DC Orders ED Discharge Orders    None       Lynnsey Barbara, Corene Cornea, MD 03/27/20 [phone]

## 2020-03-28 ENCOUNTER — Other Ambulatory Visit: Payer: Self-pay

## 2020-03-28 ENCOUNTER — Encounter (HOSPITAL_COMMUNITY): Payer: Self-pay | Admitting: Internal Medicine

## 2020-03-28 ENCOUNTER — Inpatient Hospital Stay (HOSPITAL_COMMUNITY): Payer: PPO

## 2020-03-28 ENCOUNTER — Telehealth: Payer: PPO | Admitting: Oncology

## 2020-03-28 DIAGNOSIS — Z20822 Contact with and (suspected) exposure to covid-19: Secondary | ICD-10-CM | POA: Diagnosis present

## 2020-03-28 DIAGNOSIS — E782 Mixed hyperlipidemia: Secondary | ICD-10-CM | POA: Diagnosis present

## 2020-03-28 DIAGNOSIS — E039 Hypothyroidism, unspecified: Secondary | ICD-10-CM | POA: Diagnosis present

## 2020-03-28 DIAGNOSIS — N179 Acute kidney failure, unspecified: Secondary | ICD-10-CM | POA: Diagnosis present

## 2020-03-28 DIAGNOSIS — N1832 Chronic kidney disease, stage 3b: Secondary | ICD-10-CM | POA: Diagnosis not present

## 2020-03-28 DIAGNOSIS — D649 Anemia, unspecified: Secondary | ICD-10-CM | POA: Diagnosis not present

## 2020-03-28 DIAGNOSIS — Z87440 Personal history of urinary (tract) infections: Secondary | ICD-10-CM | POA: Diagnosis not present

## 2020-03-28 DIAGNOSIS — Z95810 Presence of automatic (implantable) cardiac defibrillator: Secondary | ICD-10-CM | POA: Diagnosis not present

## 2020-03-28 DIAGNOSIS — I428 Other cardiomyopathies: Secondary | ICD-10-CM | POA: Diagnosis present

## 2020-03-28 DIAGNOSIS — K317 Polyp of stomach and duodenum: Secondary | ICD-10-CM | POA: Diagnosis present

## 2020-03-28 DIAGNOSIS — K31811 Angiodysplasia of stomach and duodenum with bleeding: Secondary | ICD-10-CM | POA: Diagnosis present

## 2020-03-28 DIAGNOSIS — K922 Gastrointestinal hemorrhage, unspecified: Secondary | ICD-10-CM | POA: Diagnosis present

## 2020-03-28 DIAGNOSIS — R4020 Unspecified coma: Secondary | ICD-10-CM | POA: Diagnosis not present

## 2020-03-28 DIAGNOSIS — I739 Peripheral vascular disease, unspecified: Secondary | ICD-10-CM | POA: Diagnosis present

## 2020-03-28 DIAGNOSIS — I4821 Permanent atrial fibrillation: Secondary | ICD-10-CM | POA: Diagnosis present

## 2020-03-28 DIAGNOSIS — R404 Transient alteration of awareness: Secondary | ICD-10-CM | POA: Diagnosis not present

## 2020-03-28 DIAGNOSIS — D62 Acute posthemorrhagic anemia: Secondary | ICD-10-CM | POA: Diagnosis present

## 2020-03-28 DIAGNOSIS — I5033 Acute on chronic diastolic (congestive) heart failure: Secondary | ICD-10-CM | POA: Diagnosis present

## 2020-03-28 DIAGNOSIS — J449 Chronic obstructive pulmonary disease, unspecified: Secondary | ICD-10-CM | POA: Diagnosis present

## 2020-03-28 DIAGNOSIS — Z885 Allergy status to narcotic agent status: Secondary | ICD-10-CM | POA: Diagnosis not present

## 2020-03-28 DIAGNOSIS — W19XXXA Unspecified fall, initial encounter: Secondary | ICD-10-CM | POA: Diagnosis not present

## 2020-03-28 DIAGNOSIS — Z882 Allergy status to sulfonamides status: Secondary | ICD-10-CM | POA: Diagnosis not present

## 2020-03-28 DIAGNOSIS — K551 Chronic vascular disorders of intestine: Secondary | ICD-10-CM | POA: Diagnosis present

## 2020-03-28 DIAGNOSIS — Z888 Allergy status to other drugs, medicaments and biological substances status: Secondary | ICD-10-CM | POA: Diagnosis not present

## 2020-03-28 DIAGNOSIS — Z7189 Other specified counseling: Secondary | ICD-10-CM | POA: Diagnosis not present

## 2020-03-28 DIAGNOSIS — K5521 Angiodysplasia of colon with hemorrhage: Secondary | ICD-10-CM | POA: Diagnosis present

## 2020-03-28 DIAGNOSIS — I130 Hypertensive heart and chronic kidney disease with heart failure and stage 1 through stage 4 chronic kidney disease, or unspecified chronic kidney disease: Secondary | ICD-10-CM | POA: Diagnosis present

## 2020-03-28 DIAGNOSIS — Z66 Do not resuscitate: Secondary | ICD-10-CM | POA: Diagnosis not present

## 2020-03-28 DIAGNOSIS — Z7901 Long term (current) use of anticoagulants: Secondary | ICD-10-CM | POA: Diagnosis not present

## 2020-03-28 HISTORY — DX: Gastrointestinal hemorrhage, unspecified: K92.2

## 2020-03-28 LAB — URINALYSIS, COMPLETE (UACMP) WITH MICROSCOPIC
Bacteria, UA: NONE SEEN
Bilirubin Urine: NEGATIVE
Glucose, UA: NEGATIVE mg/dL
Hgb urine dipstick: NEGATIVE
Ketones, ur: NEGATIVE mg/dL
Leukocytes,Ua: NEGATIVE
Nitrite: NEGATIVE
Protein, ur: NEGATIVE mg/dL
Specific Gravity, Urine: 1.009 (ref 1.005–1.030)
pH: 6 (ref 5.0–8.0)

## 2020-03-28 LAB — CBC
HCT: 25.4 % — ABNORMAL LOW (ref 36.0–46.0)
Hemoglobin: 7.9 g/dL — ABNORMAL LOW (ref 12.0–15.0)
MCH: 27.6 pg (ref 26.0–34.0)
MCHC: 31.1 g/dL (ref 30.0–36.0)
MCV: 88.8 fL (ref 80.0–100.0)
Platelets: 204 10*3/uL (ref 150–400)
RBC: 2.86 MIL/uL — ABNORMAL LOW (ref 3.87–5.11)
RDW: 18.2 % — ABNORMAL HIGH (ref 11.5–15.5)
WBC: 5.9 10*3/uL (ref 4.0–10.5)
nRBC: 2.9 % — ABNORMAL HIGH (ref 0.0–0.2)

## 2020-03-28 LAB — CBC WITH DIFFERENTIAL/PLATELET
Abs Immature Granulocytes: 0.04 10*3/uL (ref 0.00–0.07)
Basophils Absolute: 0 10*3/uL (ref 0.0–0.1)
Basophils Relative: 1 %
Eosinophils Absolute: 0.1 10*3/uL (ref 0.0–0.5)
Eosinophils Relative: 2 %
HCT: 24.7 % — ABNORMAL LOW (ref 36.0–46.0)
Hemoglobin: 7.4 g/dL — ABNORMAL LOW (ref 12.0–15.0)
Immature Granulocytes: 1 %
Lymphocytes Relative: 11 %
Lymphs Abs: 0.6 10*3/uL — ABNORMAL LOW (ref 0.7–4.0)
MCH: 26.5 pg (ref 26.0–34.0)
MCHC: 30 g/dL (ref 30.0–36.0)
MCV: 88.5 fL (ref 80.0–100.0)
Monocytes Absolute: 0.6 10*3/uL (ref 0.1–1.0)
Monocytes Relative: 11 %
Neutro Abs: 4.2 10*3/uL (ref 1.7–7.7)
Neutrophils Relative %: 74 %
Platelets: 217 10*3/uL (ref 150–400)
RBC: 2.79 MIL/uL — ABNORMAL LOW (ref 3.87–5.11)
RDW: 18.1 % — ABNORMAL HIGH (ref 11.5–15.5)
WBC: 5.6 10*3/uL (ref 4.0–10.5)
nRBC: 1.1 % — ABNORMAL HIGH (ref 0.0–0.2)

## 2020-03-28 LAB — BASIC METABOLIC PANEL
Anion gap: 10 (ref 5–15)
BUN: 58 mg/dL — ABNORMAL HIGH (ref 8–23)
CO2: 26 mmol/L (ref 22–32)
Calcium: 8.3 mg/dL — ABNORMAL LOW (ref 8.9–10.3)
Chloride: 99 mmol/L (ref 98–111)
Creatinine, Ser: 1.81 mg/dL — ABNORMAL HIGH (ref 0.44–1.00)
GFR, Estimated: 27 mL/min — ABNORMAL LOW (ref >60)
Glucose, Bld: 101 mg/dL — ABNORMAL HIGH (ref 70–99)
Potassium: 3.9 mmol/L (ref 3.5–5.1)
Sodium: 135 mmol/L (ref 135–145)

## 2020-03-28 LAB — TROPONIN I (HIGH SENSITIVITY): Troponin I (High Sensitivity): 49 ng/L — ABNORMAL HIGH (ref <18)

## 2020-03-28 LAB — PROTIME-INR
INR: 4.3 (ref 0.8–1.2)
Prothrombin Time: 40.1 seconds — ABNORMAL HIGH (ref 11.4–15.2)

## 2020-03-28 LAB — MAGNESIUM: Magnesium: 2.2 mg/dL (ref 1.7–2.4)

## 2020-03-28 LAB — PREPARE RBC (CROSSMATCH)

## 2020-03-28 MED ORDER — DIPHENHYDRAMINE HCL 25 MG PO CAPS: 25.0000 mg | ORAL_CAPSULE | Freq: Four times a day (QID) | ORAL | Status: DC | PRN

## 2020-03-28 MED ORDER — POTASSIUM CHLORIDE CRYS ER 20 MEQ PO TBCR
20.0000 meq | EXTENDED_RELEASE_TABLET | Freq: Once | ORAL | Status: AC
  Administered 2020-03-28: 20 meq via ORAL
  Filled 2020-03-28: qty 1

## 2020-03-28 MED ORDER — DIPHENHYDRAMINE HCL 50 MG/ML IJ SOLN
INTRAMUSCULAR | Status: AC
  Filled 2020-03-28: qty 1

## 2020-03-28 MED ORDER — FUROSEMIDE 10 MG/ML IJ SOLN
80.0000 mg | Freq: Two times a day (BID) | INTRAMUSCULAR | Status: DC
  Administered 2020-03-28: 80 mg via INTRAVENOUS
  Filled 2020-03-28: qty 8

## 2020-03-28 MED ORDER — LACTATED RINGERS IV SOLN: INTRAVENOUS | Status: DC

## 2020-03-28 MED ORDER — DIPHENHYDRAMINE HCL 50 MG/ML IJ SOLN
12.5000 mg | Freq: Once | INTRAMUSCULAR | Status: AC
  Administered 2020-03-28: 12.5 mg via INTRAVENOUS

## 2020-03-28 MED ORDER — PHYTONADIONE 5 MG PO TABS
2.5000 mg | ORAL_TABLET | Freq: Once | ORAL | Status: AC
  Administered 2020-03-28: 2.5 mg via ORAL
  Filled 2020-03-28: qty 1

## 2020-03-28 MED ORDER — TORSEMIDE 100 MG PO TABS
100.0000 mg | ORAL_TABLET | Freq: Two times a day (BID) | ORAL | Status: DC
  Filled 2020-03-28: qty 1

## 2020-03-28 MED ORDER — SODIUM CHLORIDE 0.9% IV SOLUTION: Freq: Once | INTRAVENOUS | Status: AC

## 2020-03-28 NOTE — Progress Notes (Signed)
Called to patients room by Bedside Nurse. On arrival to room patient was waking up,per daughter patient was sitting up on the side of the bed and looked like she passed out and fell over in the bed. VS assessed and stable. Patient is alert and oriented but doesn't recall incident. Notified Rapid response nurse of event to put her on the Radar. Will continue to monitor.

## 2020-03-28 NOTE — Plan of Care (Signed)
  Problem: Clinical Measurements: Goal: Respiratory complications will improve Outcome: Progressing   Problem: Clinical Measurements: Goal: Cardiovascular complication will be avoided Outcome: Progressing   Problem: Coping: Goal: Level of anxiety will decrease Outcome: Progressing   Problem: Elimination: Goal: Will not experience complications related to urinary retention Outcome: Progressing   Problem: Pain Managment: Goal: General experience of comfort will improve Outcome: Progressing   Problem: Skin Integrity: Goal: Risk for impaired skin integrity will decrease Outcome: Progressing

## 2020-03-28 NOTE — Progress Notes (Signed)
Daily Progress Note   Patient Name: [name]       Date: 03/28/2020 DOB: 1934-01-20  Age: 86 y.o. MRN#: [id number] Attending Physician: Aline August, MD Primary Care Physician: Elby Beck, FNP Admit Date: 03/26/2020  Reason for Follow-up: continued GOC discussion  Subjective: Note that patient had a blood transfusion reaction earlier today. She reports still feeling quite bad and tired from this. Daughter [name]) and granddaughter (by marriage) are at bedside.   Patient expresses concern about receiving additional blood transfusions. She also states she "won't make it" without receiving blood if needed.  Discussed that allergic reactions are a known risk of blood transfusions. Patient reports that her tongue swelled during the reaction. I provided education that tongue swelling is consistent with a very serious reaction, sometimes leading to anaphylaxis. Discussed that patient can be pre-medicated with benadryl and tylenol to reduce (but not eliminate) the risk of reaction.  Patient's INR is 4.3 today.  Discussed that per GI, she can have endoscopic testing when her INR is less than 1.5.   [name] brings up the issue of advanced directives and code status. She seems to have a knowledge deficit about these issues. I provided detailed education on the role of an HCPOA. Also provided clarification on DNR versus full code. [name] knows her mother does not want to be intubated. Patient requests to talk about this later, she is not ready to make an official decisions.   Length of Stay: 0  Current Medications: Scheduled Meds:  . ezetimibe  10 mg Oral Daily  . furosemide  80 mg Intravenous BID  . levothyroxine  25 mcg Oral Q0600  . pantoprazole  40 mg Oral Q0600  . rosuvastatin  40  mg Oral Daily      PRN Meds: acetaminophen **OR** acetaminophen, ondansetron **OR** ondansetron (ZOFRAN) IV, polyethylene glycol  Physical Exam Constitutional:      Appearance: She is ill-appearing.  Pulmonary:     Effort: Pulmonary effort is normal.  Neurological:     Mental Status: She is alert and oriented to person, place, and time.     Motor: Weakness present.             Vital Signs: BP (!) 124/42 (BP Location: Right Arm)   Pulse 62   Temp 98.8 F (37.1 C) (Oral)  Resp 18   Ht 5\' 6"  (1.676 m)   Wt 65.8 kg   SpO2 100%   BMI 23.40 kg/m  SpO2: SpO2: 100 % O2 Device: O2 Device: Nasal Cannula O2 Flow Rate: O2 Flow Rate (L/min): 2 L/min  Intake/output summary:   Intake/Output Summary (Last 24 hours) at 03/28/2020 1628 Last data filed at 03/28/2020 1236 Gross per 24 hour  Intake 623 ml  Output 400 ml  Net 223 ml   LBM:   Baseline Weight: Weight: 65.8 kg Most recent weight: Weight: 65.8 kg       Palliative Assessment/Data: PPS 30%      Palliative Care Assessment & Plan   HPI/Patient Profile: 86 y.o. female  with past medical history of chronic atrial fibrillation on Coumadin, chronic diastolic CHF, nonischemic cardiomyopathy s/p ICD placement, chronic kidney disease stage 3, renal cell cancer s/p right nephrectomy (2003), and chronic anemia. She presented to the emergency department on 03/26/2020 with weakness, chest tightness, and dyspnea on exertion.  ED Course: Hemoglobin 6.2, positive FOBT, supratherapeutic INR at 4.7. Patient treated with PRBCs transfusion and vitamin K.   Assessment: - acute on chronic blood loss anemia - possible GI bleeding - supratherapeutic INR from Coumadin - acute in chronic diastolic CHF - remote history of nonischemic cardiomyopathy s/p ICD placement - permanent A-fib - chronic kidney disease stage 3b - generalized weakness and deconditioning  Recommendations/Plan: - full code full scope with ongoing discussions -  patient reported tongue swelling during blood transfusion - could lead to more serious reaction including anaphylaxis  - patient is willing to have endo procedures once INR<1.5 - patient and now daughter seems to indicate that DNR status is appropriate but not ready to make this official - patient would not want prolonged mechanical ventilation or other life-prolonging measures if there was no chance for meaningful recovery - PMT will continue to follow  Goals of Care and Additional Recommendations: goal of care is to improve and return home   Code Status: Full with ongoing discussions  Prognosis:  Unable to determine  Discharge Planning: To Be Determined  Thank you for allowing the Palliative Medicine Team to assist in the care of this patient.   Total Time 25 minutes Prolonged Time Billed  no       Greater than 50%  of this time was spent counseling and coordinating care related to the above assessment and plan.  Lavena Bullion, NP  Please contact Palliative Medicine Team phone at [phone] for questions and concerns.

## 2020-03-28 NOTE — Progress Notes (Signed)
Daily Rounding Note  03/28/2020, 12:05 PM  LOS: 0 days   SUBJECTIVE:   Chief complaint:    Anemia  Patient developed lower extremity tingling/numbness, nausea, nonbloody emesis, chills towards the end of receiving her third unit of blood today.   OBJECTIVE:         Vital signs in last 24 hours:    Temp:  [97.6 F (36.4 C)-97.9 F (36.6 C)] 97.6 F (36.4 C) (11/23 1005) Pulse Rate:  [60-119] 62 (11/23 1005) Resp:  [18-32] 18 (11/23 1005) BP: (97-132)/(37-76) 116/62 (11/23 1005) SpO2:  [94 %-100 %] 96 % (11/23 1005)   Filed Weights   03/26/20 2342  Weight: 65.8 kg   General: Looks poorly, frail, chronically ill. Heart: RRR. Chest: No labored breathing, no cough Abdomen: Soft without tenderness.  Active bowel sounds.  No distention. Extremities: No CCE.  Limbs overall thin. Neuro/Psych: Cooperative, anxious, pleasant.  Oriented x3.  Fluid speech.  Intake/Output from previous day: 11/22 0701 - 11/23 0700 In: 0017 [Blood:1455] Out: 400 [Urine:400]  Intake/Output this shift: No intake/output data recorded.  Lab Results: Recent Labs    03/27/20 1555 03/27/20 2204 03/28/20 0245  WBC 8.1 6.8 5.6  HGB 7.5* 8.0* 7.4*  HCT 24.9* 26.0* 24.7*  PLT 217 236 217   BMET Recent Labs    03/27/20 0001 03/27/20 1555 03/28/20 0245  NA 135 135 135  K 4.6 3.8 3.9  CL 98 95* 99  CO2 26 29 26   GLUCOSE 108* 118* 101*  BUN 54* 55* 58*  CREATININE 1.64* 1.89* 1.81*  CALCIUM 8.8* 8.2* 8.3*   LFT Recent Labs    03/27/20 0001 03/27/20 1555  PROT 5.8* 5.3*  ALBUMIN 3.5 3.0*  AST 17 21  ALT 12 11  ALKPHOS 40 35*  BILITOT 0.9 1.7*   PT/INR Recent Labs    03/27/20 0204 03/28/20 0245  LABPROT 43.0* 40.1*  INR 4.7* 4.3*   Hepatitis Panel No results for input(s): HEPBSAG, HCVAB, HEPAIGM, HEPBIGM in the last 72 hours.  Studies/Results: DG Chest Portable 1 View  Result Date: 03/27/2020 CLINICAL DATA:   eva EXAM: PORTABLE CHEST 1 VIEW COMPARISON:  02/25/2019 FINDINGS: Cardiac pacemaker. Cardiac enlargement. No vascular congestion or edema. No focal consolidation. There is blunting of both costophrenic angles, unchanged since prior study, consistent with recurrent effusions or pleural thickening. No pneumothorax. Scattered calcified granulomas in the lungs. Slight interstitial pattern to the lungs likely represents fibrosis. Calcification of the aorta. Mediastinal contours appear intact. Sclerosis in the proximal left humerus is unchanged, likely and chondroma. IMPRESSION: Cardiac enlargement. No evidence of active pulmonary disease. Chronic effusions or pleural thickening in the costophrenic angles. Electronically Signed   By: Lucienne Capers M.D.   On: 03/27/2020 00:32   ECHOCARDIOGRAM COMPLETE  Result Date: 03/27/2020 IMPRESSIONS  1. Left ventricular ejection fraction, by estimation, is 65 to 70%. The left ventricle has normal function. The left ventricle has no regional wall motion abnormalities. There is moderate left ventricular hypertrophy. Left ventricular diastolic parameters are indeterminate. There is the interventricular septum is flattened in systole and diastole, consistent with right ventricular pressure and volume overload.  2. Right ventricular systolic function is mildly reduced. The right ventricular size is normal. A n AICD wire is visualized. There is severely elevated pulmonary artery systolic pressure. The estimated right ventricular systolic pressure is 49.4 mmHg.  3. Left atrial size was mildly dilated.  4. Right atrial size was severely dilated.  5.  The mitral valve is abnormal. Mild to moderate mitral valve regurgitation.  6. The tricuspid valve is abnormal. Tricuspid valve regurgitation is severe.  7. Valve area measures 0.98 cm2 by planimetry. The aortic valve is calcified. Aortic valve regurgitation is mild. Moderate to severe aortic valve stenosis. Aortic valve area, by VTI  measures 0.95 cm. Aortic valve mean gradient measures 13.0 mmHg. Aortic valve Vmax measures 2.42 m/s.  8. The inferior vena cava is dilated in size with <50% respiratory variability, suggesting right atrial pressure of 15 mmHg. Comparison(s): Changes from prior study are noted. 10/06/2018: LVEF 60-65%. RVSP 93 mmHg, mildto moderate TR.   Electronically signed by Lyman Bishop MD Signature Date/Time: 03/27/2020/3:27:57 PM    Final     ASSESMENT:   *   Recurrent acute on chronic anemia.  Despite bid oral iron.  FOBT +.  Assume anemia secondary to GI blood loss though has not had any assess of overt blood loss. Receives Feraheme, CSA infusions per hematologist. EGD, colonoscopy, capsule endoscopy in October/November 2020.  Had erosive gastritis.  Chronic ischemic colitis.  Nonbleeding duodenal AVM with small streak of blood downstream from this.  No subsequent attempts at enteroscopy. Hgb 6.2 >> 2 PRBCs >> 8 >> 7.4 >> 7/8ths unit of PRBC b 12, folated, iron ok.  Iron sats low normal.  Ferritin 25.    *   Adverse reaction to transfusion, had some mild sxs yesterday but received 2 U in total.   w her 3rd unit this AM got nauseated, vomited, tingling/numbness in legs, hypertension.  Just a little bit of the PRBC remained when transfusion aborted.   Received Benadryl and feeling better.     *    Chronic Coumadin for history of A. fib.  INR 4.7 >> 4.3 Got po Vit K yesterday 0330 and 0520 this AM.    *  CKD stage 3.     PLAN   *    Dr. Lyndel Safe recommends push enteroscopy and repeat colonoscopy when INR less than 1.5 and cleared by cardiology. After meeting with palliative care, pt willing to undergo repeat endoscopic studies.  *   Continue hear diet, switch to clears on day prior to procedures.    [name]  03/28/2020, 12:05 PM Phone [phone]

## 2020-03-28 NOTE — Progress Notes (Signed)
Date and time results received: 03/28/20 & 0350  Test: INR  Critical Value: 4.3  Name of Provider Notified: [facility]   Orders Received? Or Actions Taken? MD callback @ 0401. Oral dose of vitamin K ordered.

## 2020-03-28 NOTE — Significant Event (Addendum)
Rapid Response Event Note   Reason for Call :  Concern for transfusion reaction. Pt complaining of tongue numbness, aching legs, chills, and chest pain.   Initial Focused Assessment:  Pt is in bed, she is restless/anxious upon my arrival. Lung sounds are clear. Skin is warm, dry. Pt lips are cyanotic. Tongue does not appear swollen and airway is protected. Grips are equal. PERRLA, 28mm.   VS: T 97.19F, BP 149/87, HR 98, RR 20, SpO2 88% on room air  Interventions:  -Transfusion reaction protocol initiated -EKG, Troponin check -Warm blankets -Supplemental oxygen applied  Plan of Care:  -Follow up with provider results of labs ordered -Increased frequency of vital signs x 4 hours -Wean oxygen as pt tolerated  Following administration of Tylenol and Benadryl, pt denies leg pain and tongue is no longer numb. Her lips color has returned to baseline. She is calm and resting. Denies pain.   Call rapid response for additional needs.  Addendum: Around 1700 pt had brief loss of consciousness while lying in bed following having been up to in her room. Per RN, pt had no lasting effects once conscious was regained. RN paged primary team and cardiology. CT head was ordered. Charge RN called to provide update, RR RN not needed. CBG was not checked at this time. VS documented in flowsheet. RR RN rounded on pt at 1845, pt awake & alert. Color is pink. She endorses having had an eventful day and feels tired now. Recommend orthostatic vital signs tomorrow. Bedrest tonight.   Event Summary:  MD Notified: Dr. Starla Link Call Time: 1238 Arrival Time: 1240 End Time: [place], RN

## 2020-03-28 NOTE — Progress Notes (Signed)
Patient ID: [name], female   DOB: 1934/01/02, 86 y.o.   MRN: [id number]  PROGRESS NOTE    [name]  KWI:[id number] DOB: 07-24-1933 DOA: 03/26/2020 PCP: Elby Beck, FNP   Brief Narrative:  86 year old female with past medical history of permanent atrial fibrillation (on coumadin),  chronic diastolic congestive heart failure (Echo 10/2018 EF 60-65%), nonischemic cardiomyopathy status post ICD placement, hypothyroidism, hypertension, COPD, peripheral artery disease, chronic kidney disease stage IIIb, iron deficiency anemia and ischemic colitis presented with generalized weakness, chest pain and black stools.  On presentation, hemoglobin was 6.2, INR was 4.7.  She was transfused packed red cells, vitamin K was given and GI was consulted.  Subsequently heart failure team and palliative care team were also consulted  Assessment & Plan:   Acute on chronic blood loss anemia Anemia of chronic disease from chronic kidney disease Possible GI bleeding -Presented with hemoglobin of 6.2, down from 8.3 1-week ago.  INR was supratherapeutic at 4.7 on presentation. -Received 2 units of packed red cell transfusion on presentation along with 2.5 mg of vitamin K -GI has been consulted.  Continue oral Protonix.  GI might consider post enteroscopy and repeat colonoscopy once INR is less than for 1.5 if patient is willing.  Patient is now agreeable for procedures. -INR 4.3 this morning.  Vitamin K given again this morning.  Repeat a.m. INR. -Hemoglobin 7.4 this morning.  Patient still feels weak, dizzy with black stools yesterday.  Will transfuse 1 more unit of packed red cells today.  Monitor H&H.  Acute on chronic diastolic CHF Remote history of nonischemic cardiomyopathy status post ICD -Echo on 10/2018 showed EF of 60 to 65% -Heart failure team following.  Received a dose of IV Lasix yesterday afternoon but the evening dose of IV Lasix was held because of low blood pressures and  dizziness. -She has been put back on oral torsemide by heart failure team.  Strict input and output.  Daily weights.  Fluid restriction. -Echo on 03/27/2020 showed EF of 65 to 70% with mild to moderate mitral valve regurgitation and severe tricuspid valve regurgitation  Supratherapeutic INR -Due to Coumadin use.  Patient received  1 more dose of vitamin K again this morning.  Check a.m. INR.  Permanent atrial fibrillation -Coumadin on hold.  Currently rate controlled.  Chronic kidney disease stage IIIb -Creatinine stable.  Monitor  Hypothyroidism -Continue Synthroid  Hyperlipidemia -Continue statin  Generalized deconditioning -PT eval -Palliative care following for goals of care discussion.  Currently remains full code.   DVT prophylaxis: SCDs Code Status: Full Family Communication: None at bedside Disposition Plan: Status is: Inpatient  Remains inpatient appropriate because:Inpatient level of care appropriate due to severity of illness patient still feels dizzy and have black stools yesterday.  She will need 1 more unit of blood transfusion today and possible GI intervention at some point.  Dispo: The patient is from: Home              Anticipated d/c is to: Home              Anticipated d/c date is: 3 days              Patient currently is not medically stable to d/c.   Consultants: GI/advanced heart failure team/palliative care  Procedures: Echo as below  Antimicrobials: None   Subjective: Patient seen and examined at bedside.  Feels slightly better but still feels weak with some nausea and intermittent dizziness.  Had  black stools yesterday.  No overnight fever or vomiting.  Denies current chest pain.  Denies worsening shortness of breath.  Objective: Vitals:   03/28/20 0531 03/28/20 0839 03/28/20 0956 03/28/20 1005  BP: 121/69 (!) 109/38 116/62 116/62  Pulse: 69 74 63 62  Resp: 20 20 20 18   Temp: 97.8 F (36.6 C) 97.9 F (36.6 C) 97.6 F (36.4 C) 97.6 F  (36.4 C)  TempSrc: Oral  Oral Oral  SpO2: 96% 94% 95% 96%  Weight:      Height:        Intake/Output Summary (Last 24 hours) at 03/28/2020 1032 Last data filed at 03/28/2020 0658 Gross per 24 hour  Intake 640 ml  Output 400 ml  Net 240 ml   Filed Weights   03/26/20 2342  Weight: 65.8 kg    Examination:  General exam: Appears calm and comfortable.  Elderly female sitting at the edge of the bed. Respiratory system: Bilateral decreased breath sounds at bases with basilar crackles Cardiovascular system: S1 & S2 heard, Rate controlled Gastrointestinal system: Abdomen is nondistended, soft and nontender. Normal bowel sounds heard. Extremities: No cyanosis, clubbing; trace lower extremity edema Central nervous system: Alert and oriented. No focal neurological deficits. Moving extremities Skin: No rashes, lesions or ulcers Psychiatry: Flat affect.  Looks intermittently anxious    Data Reviewed: I have personally reviewed following labs and imaging studies  CBC: Recent Labs  Lab 03/27/20 0001 03/27/20 1555 03/27/20 2204 03/28/20 0245  WBC 5.1 8.1 6.8 5.6  NEUTROABS 3.6  --   --  4.2  HGB 6.2* 7.5* 8.0* 7.4*  HCT 21.7* 24.9* 26.0* 24.7*  MCV 93.1 89.9 87.2 88.5  PLT 262 217 236 683   Basic Metabolic Panel: Recent Labs  Lab 03/27/20 0001 03/27/20 1555 03/28/20 0245  NA 135 135 135  K 4.6 3.8 3.9  CL 98 95* 99  CO2 26 29 26   GLUCOSE 108* 118* 101*  BUN 54* 55* 58*  CREATININE 1.64* 1.89* 1.81*  CALCIUM 8.8* 8.2* 8.3*  MG  --   --  2.2   GFR: Estimated Creatinine Clearance: 21.7 mL/min (A) (by C-G formula based on SCr of 1.81 mg/dL (H)). Liver Function Tests: Recent Labs  Lab 03/27/20 0001 03/27/20 1555  AST 17 21  ALT 12 11  ALKPHOS 40 35*  BILITOT 0.9 1.7*  PROT 5.8* 5.3*  ALBUMIN 3.5 3.0*   No results for input(s): LIPASE, AMYLASE in the last 168 hours. No results for input(s): AMMONIA in the last 168 hours. Coagulation Profile: Recent Labs    Lab 03/27/20 0204 03/28/20 0245  INR 4.7* 4.3*   Cardiac Enzymes: No results for input(s): CKTOTAL, CKMB, CKMBINDEX, TROPONINI in the last 168 hours. BNP (last 3 results) Recent Labs    05/06/19 1201  PROBNP 431.0*   HbA1C: No results for input(s): HGBA1C in the last 72 hours. CBG: No results for input(s): GLUCAP in the last 168 hours. Lipid Profile: No results for input(s): CHOL, HDL, LDLCALC, TRIG, CHOLHDL, LDLDIRECT in the last 72 hours. Thyroid Function Tests: No results for input(s): TSH, T4TOTAL, FREET4, T3FREE, THYROIDAB in the last 72 hours. Anemia Panel: No results for input(s): VITAMINB12, FOLATE, FERRITIN, TIBC, IRON, RETICCTPCT in the last 72 hours. Sepsis Labs: No results for input(s): PROCALCITON, LATICACIDVEN in the last 168 hours.  Recent Results (from the past 240 hour(s))  Respiratory Panel by RT PCR (Flu A&B, Covid) - Nasopharyngeal Swab     Status: None   Collection Time: 03/27/20  5:38 AM   Specimen: Nasopharyngeal Swab; Nasopharyngeal(NP) swabs in vial transport medium  Result Value Ref Range Status   SARS Coronavirus 2 by RT PCR NEGATIVE NEGATIVE Final    Comment: (NOTE) SARS-CoV-2 target nucleic acids are NOT DETECTED.  The SARS-CoV-2 RNA is generally detectable in upper respiratoy specimens during the acute phase of infection. The lowest concentration of SARS-CoV-2 viral copies this assay can detect is 131 copies/mL. A negative result does not preclude SARS-Cov-2 infection and should not be used as the sole basis for treatment or other patient management decisions. A negative result may occur with  improper specimen collection/handling, submission of specimen other than nasopharyngeal swab, presence of viral mutation(s) within the areas targeted by this assay, and inadequate number of viral copies (<131 copies/mL). A negative result must be combined with clinical observations, patient history, and epidemiological information. The expected  result is Negative.  Fact Sheet for Patients:  PinkCheek.be  Fact Sheet for Healthcare Providers:  GravelBags.it  This test is no t yet approved or cleared by the [place] FDA and  has been authorized for detection and/or diagnosis of SARS-CoV-2 by FDA under an Emergency Use Authorization (EUA). This EUA will remain  in effect (meaning this test can be used) for the duration of the COVID-19 declaration under Section 564(b)(1) of the Act, 21 U.S.C. section 360bbb-3(b)(1), unless the authorization is terminated or revoked sooner.     Influenza A by PCR NEGATIVE NEGATIVE Final   Influenza B by PCR NEGATIVE NEGATIVE Final    Comment: (NOTE) The Xpert Xpress SARS-CoV-2/FLU/RSV assay is intended as an aid in  the diagnosis of influenza from Nasopharyngeal swab specimens and  should not be used as a sole basis for treatment. Nasal washings and  aspirates are unacceptable for Xpert Xpress SARS-CoV-2/FLU/RSV  testing.  Fact Sheet for Patients: PinkCheek.be  Fact Sheet for Healthcare Providers: GravelBags.it  This test is not yet approved or cleared by the [place] FDA and  has been authorized for detection and/or diagnosis of SARS-CoV-2 by  FDA under an Emergency Use Authorization (EUA). This EUA will remain  in effect (meaning this test can be used) for the duration of the  Covid-19 declaration under Section 564(b)(1) of the Act, 21  U.S.C. section 360bbb-3(b)(1), unless the authorization is  terminated or revoked. Performed at [facility], [place] [address], [place], [place] [address]          Radiology Studies: DG Chest Portable 1 View  Result Date: 03/27/2020 CLINICAL DATA:  eva EXAM: PORTABLE CHEST 1 VIEW COMPARISON:  02/25/2019 FINDINGS: Cardiac pacemaker. Cardiac enlargement. No vascular congestion or edema. No focal consolidation. There is  blunting of both costophrenic angles, unchanged since prior study, consistent with recurrent effusions or pleural thickening. No pneumothorax. Scattered calcified granulomas in the lungs. Slight interstitial pattern to the lungs likely represents fibrosis. Calcification of the aorta. Mediastinal contours appear intact. Sclerosis in the proximal left humerus is unchanged, likely and chondroma. IMPRESSION: Cardiac enlargement. No evidence of active pulmonary disease. Chronic effusions or pleural thickening in the costophrenic angles. Electronically Signed   By: Lucienne Capers M.D.   On: 03/27/2020 00:32   ECHOCARDIOGRAM COMPLETE  Result Date: 03/27/2020    ECHOCARDIOGRAM REPORT   Patient Name:   [name] Date of Exam: 03/27/2020 Medical Rec #:  [id number]         Height:       66.0 in Accession #:    [id number]  Weight:       145.0 lb Date of Birth:  [birth year]         BSA:          1.744 m Patient Age:    72 years          BP:           137/57 mmHg Patient Gender: F                 HR:           73 bpm. Exam Location:  Inpatient Procedure: 2D Echo, Cardiac Doppler and Color Doppler Indications:    Congestive Heart Failure 428.0 / I50.9  History:        Patient has prior history of Echocardiogram examinations, most                 recent 10/06/2018. Cardiomyopathy and CHF, CAD, Arrythmias:Atrial                 Fibrillation, Signs/Symptoms:Chest Pain; Risk                 Factors:Hypertension.  Sonographer:    Bernadene Person RDCS Referring Phys: [id number] [name]  1. Left ventricular ejection fraction, by estimation, is 65 to 70%. The left ventricle has normal function. The left ventricle has no regional wall motion abnormalities. There is moderate left ventricular hypertrophy. Left ventricular diastolic parameters are indeterminate. There is the interventricular septum is flattened in systole and diastole, consistent with right ventricular pressure and volume overload.  2. Right  ventricular systolic function is mildly reduced. The right ventricular size is normal. A n AICD wire is visualized. There is severely elevated pulmonary artery systolic pressure. The estimated right ventricular systolic pressure is 41.9 mmHg.  3. Left atrial size was mildly dilated.  4. Right atrial size was severely dilated.  5. The mitral valve is abnormal. Mild to moderate mitral valve regurgitation.  6. The tricuspid valve is abnormal. Tricuspid valve regurgitation is severe.  7. Valve area measures 0.98 cm2 by planimetry. The aortic valve is calcified. Aortic valve regurgitation is mild. Moderate to severe aortic valve stenosis. Aortic valve area, by VTI measures 0.95 cm. Aortic valve mean gradient measures 13.0 mmHg. Aortic valve Vmax measures 2.42 m/s.  8. The inferior vena cava is dilated in size with <50% respiratory variability, suggesting right atrial pressure of 15 mmHg. Comparison(s): Changes from prior study are noted. 10/06/2018: LVEF 60-65%. RVSP 93 mmHg, mildto moderate TR. FINDINGS  Left Ventricle: Left ventricular ejection fraction, by estimation, is 65 to 70%. The left ventricle has normal function. The left ventricle has no regional wall motion abnormalities. The left ventricular internal cavity size was normal in size. There is  moderate left ventricular hypertrophy. The interventricular septum is flattened in systole and diastole, consistent with right ventricular pressure and volume overload. Left ventricular diastolic parameters are indeterminate. Right Ventricle: The right ventricular size is normal. No increase in right ventricular wall thickness. Right ventricular systolic function is mildly reduced. There is severely elevated pulmonary artery systolic pressure. The tricuspid regurgitant velocity is 4.21 m/s, and with an assumed right atrial pressure of 15 mmHg, the estimated right ventricular systolic pressure is 62.2 mmHg. Left Atrium: Left atrial size was mildly dilated. Right Atrium:  Right atrial size was severely dilated. Pericardium: There is no evidence of pericardial effusion. Mitral Valve: The mitral valve is abnormal. Mild to moderate mitral annular calcification. Mild to moderate mitral valve regurgitation. Tricuspid  Valve: The tricuspid valve is abnormal. Tricuspid valve regurgitation is severe. The flow in the hepatic veins is reversed during ventricular systole. Aortic Valve: Valve area measures 0.98 cm2 by planimetry. The aortic valve is calcified. Aortic valve regurgitation is mild. Aortic regurgitation PHT measures 294 msec. Moderate to severe aortic stenosis is present. Aortic valve mean gradient measures 13.0 mmHg. Aortic valve peak gradient measures 23.5 mmHg. Aortic valve area, by VTI measures 0.95 cm. Pulmonic Valve: The pulmonic valve was grossly normal. Pulmonic valve regurgitation is mild. Aorta: The aortic root and ascending aorta are structurally normal, with no evidence of dilitation. Venous: The inferior vena cava is dilated in size with less than 50% respiratory variability, suggesting right atrial pressure of 15 mmHg. IAS/Shunts: No atrial level shunt detected by color flow Doppler. Additional Comments: A n AICD wire is visualized.  LEFT VENTRICLE PLAX 2D LVIDd:         3.80 cm LVIDs:         2.20 cm LV PW:         1.30 cm LV IVS:        1.30 cm LVOT diam:     1.60 cm LV SV:         49 LV SV Index:   28 LVOT Area:     2.01 cm  RIGHT VENTRICLE RV S prime:     6.73 cm/s TAPSE (M-mode): 1.3 cm LEFT ATRIUM             Index       RIGHT ATRIUM           Index LA diam:        5.20 cm 2.98 cm/m  RA Area:     27.40 cm LA Vol (A2C):   80.3 ml 46.03 ml/m RA Volume:   93.70 ml  53.72 ml/m LA Vol (A4C):   34.6 ml 19.84 ml/m LA Biplane Vol: 53.6 ml 30.73 ml/m  AORTIC VALVE AV Area (Vmax):    0.97 cm AV Area (Vmean):   1.02 cm AV Area (VTI):     0.95 cm AV Vmax:           242.33 cm/s AV Vmean:          170.000 cm/s AV VTI:            0.518 m AV Peak Grad:      23.5 mmHg  AV Mean Grad:      13.0 mmHg LVOT Vmax:         117.00 cm/s LVOT Vmean:        85.900 cm/s LVOT VTI:          0.246 m LVOT/AV VTI ratio: 0.47 AI PHT:            294 msec  AORTA Ao Root diam: 2.90 cm Ao Asc diam:  2.80 cm MR PISA:        2.26 cm TRICUSPID VALVE MR PISA Radius: 0.60 cm  TR Peak grad:   70.9 mmHg                          TR Vmax:        421.00 cm/s                           SHUNTS  Systemic VTI:  0.25 m                          Systemic Diam: 1.60 cm Lyman Bishop MD Electronically signed by Lyman Bishop MD Signature Date/Time: 03/27/2020/3:27:57 PM    Final         Scheduled Meds:  ezetimibe  10 mg Oral Daily   levothyroxine  25 mcg Oral Q0600   pantoprazole  40 mg Oral Q0600   rosuvastatin  40 mg Oral Daily   torsemide  100 mg Oral BID   Continuous Infusions:        Aline August, MD [facility] 03/28/2020, 10:32 AM

## 2020-03-28 NOTE — Progress Notes (Signed)
ReDS Clip  Reading = 39%  CHEST RULER = 33 Clip Station = C  Kerby Nora, PharmD, BCPS Heart Failure Stewardship Pharmacist Phone [phone]  Please check [email or web address] for unit-specific pharmacist phone numbers

## 2020-03-28 NOTE — Plan of Care (Signed)
  Problem: Clinical Measurements: Goal: Will remain free from infection Outcome: Progressing   Problem: Clinical Measurements: Goal: Respiratory complications will improve Outcome: Progressing   Problem: Clinical Measurements: Goal: Cardiovascular complication will be avoided Outcome: Progressing   Problem: Coping: Goal: Level of anxiety will decrease Outcome: Progressing   

## 2020-03-28 NOTE — Progress Notes (Addendum)
Advanced Heart Failure Rounding Note  PCP-Cardiologist: Virl Axe, MD   Subjective:    Consulted for CHF evaluation by Dr. Starla Link. Presented with Hgb 6.2, INR 4.7, received 2 units of PRBCs, 2.5 mg of vitamin K.  As well as, lasix 80 mg IV x 2 and 60 mg IV between PRBCs units.   Today: Reports headache which she associates with low blood counts and nausea.  Continues to report chest tightness, described as a tight bra.  Finally, reports moved bowels yesterday, large amount of dark, tarry stool.    Objective:   Weight Range: 65.8 kg Body mass index is 23.4 kg/m.   Vital Signs:   Temp:  [97.8 F (36.6 C)-100.1 F (37.8 C)] 97.8 F (36.6 C) (11/23 0531) Pulse Rate:  [50-119] 69 (11/23 0531) Resp:  [17-34] 20 (11/23 0531) BP: (97-147)/(37-113) 121/69 (11/23 0531) SpO2:  [75 %-100 %] 96 % (11/23 0531)    Weight change: Filed Weights   03/26/20 2342  Weight: 65.8 kg    Intake/Output:   Intake/Output Summary (Last 24 hours) at 03/28/2020 0758 Last data filed at 03/28/2020 0658 Gross per 24 hour  Intake 1455 ml  Output 400 ml  Net 1055 ml      Physical Exam    General:  Pale appearing. No resp difficulty HEENT: Normal Neck: Supple. +JVD . Carotids 2+ bilat; murmur noted to carotid. No lymphadenopathy or thyromegaly appreciated. Cor: PMI nondisplaced. Regular rate & rhythm. No rubs, or gallops.  Positive murmurs. ICD noted.  Lungs: Clear bilaterally.  Abdomen: Soft, nontender, nondistended. Good bowel sounds. Extremities: No cyanosis, clubbing, rash, edema Neuro: Alert & oriented x 3, cranial nerves grossly intact. moves all 4 extremities w/o difficulty. Affect pleasant   Telemetry   Afib, rates 60-100s. Short run of NSVT.  Personally reviewed.    EKG    Atrial fibrillation, QTc 493, rate 74.    Labs    CBC Recent Labs    03/27/20 0001 03/27/20 1555 03/27/20 2204 03/28/20 0245  WBC 5.1   < > 6.8 5.6  NEUTROABS 3.6  --   --  4.2  HGB 6.2*   < >  8.0* 7.4*  HCT 21.7*   < > 26.0* 24.7*  MCV 93.1   < > 87.2 88.5  PLT 262   < > 236 217   < > = values in this interval not displayed.   Basic Metabolic Panel Recent Labs    03/27/20 1555 03/28/20 0245  NA 135 135  K 3.8 3.9  CL 95* 99  CO2 29 26  GLUCOSE 118* 101*  BUN 55* 58*  CREATININE 1.89* 1.81*  CALCIUM 8.2* 8.3*  MG  --  2.2   Liver Function Tests Recent Labs    03/27/20 0001 03/27/20 1555  AST 17 21  ALT 12 11  ALKPHOS 40 35*  BILITOT 0.9 1.7*  PROT 5.8* 5.3*  ALBUMIN 3.5 3.0*   No results for input(s): LIPASE, AMYLASE in the last 72 hours. Cardiac Enzymes No results for input(s): CKTOTAL, CKMB, CKMBINDEX, TROPONINI in the last 72 hours.  BNP: BNP (last 3 results) Recent Labs    06/03/19 1508 03/27/20 0001  BNP 386.1* 252.8*    ProBNP (last 3 results) Recent Labs    05/06/19 1201  PROBNP 431.0*     D-Dimer No results for input(s): DDIMER in the last 72 hours. Hemoglobin A1C No results for input(s): HGBA1C in the last 72 hours. Fasting Lipid Panel No results for  input(s): CHOL, HDL, LDLCALC, TRIG, CHOLHDL, LDLDIRECT in the last 72 hours. Thyroid Function Tests No results for input(s): TSH, T4TOTAL, T3FREE, THYROIDAB in the last 72 hours.  Invalid input(s): FREET3  Other results:   Imaging    ECHOCARDIOGRAM COMPLETE  Result Date: 03/27/2020    ECHOCARDIOGRAM REPORT   Patient Name:   RION SCHNITZER Date of Exam: 03/27/2020 Medical Rec #:  [id number]         Height:       66.0 in Accession #:    [id number]        Weight:       145.0 lb Date of Birth:  1934-03-16         BSA:          1.744 m Patient Age:    86 years          BP:           137/57 mmHg Patient Gender: F                 HR:           73 bpm. Exam Location:  Inpatient Procedure: 2D Echo, Cardiac Doppler and Color Doppler Indications:    Congestive Heart Failure 428.0 / I50.9  History:        Patient has prior history of Echocardiogram examinations, most                  recent 10/06/2018. Cardiomyopathy and CHF, CAD, Arrythmias:Atrial                 Fibrillation, Signs/Symptoms:Chest Pain; Risk                 Factors:Hypertension.  Sonographer:    Bernadene Person RDCS Referring Phys: [id number] [place]  1. Left ventricular ejection fraction, by estimation, is 65 to 70%. The left ventricle has normal function. The left ventricle has no regional wall motion abnormalities. There is moderate left ventricular hypertrophy. Left ventricular diastolic parameters are indeterminate. There is the interventricular septum is flattened in systole and diastole, consistent with right ventricular pressure and volume overload.  2. Right ventricular systolic function is mildly reduced. The right ventricular size is normal. A n AICD wire is visualized. There is severely elevated pulmonary artery systolic pressure. The estimated right ventricular systolic pressure is 23.5 mmHg.  3. Left atrial size was mildly dilated.  4. Right atrial size was severely dilated.  5. The mitral valve is abnormal. Mild to moderate mitral valve regurgitation.  6. The tricuspid valve is abnormal. Tricuspid valve regurgitation is severe.  7. Valve area measures 0.98 cm2 by planimetry. The aortic valve is calcified. Aortic valve regurgitation is mild. Moderate to severe aortic valve stenosis. Aortic valve area, by VTI measures 0.95 cm. Aortic valve mean gradient measures 13.0 mmHg. Aortic valve Vmax measures 2.42 m/s.  8. The inferior vena cava is dilated in size with <50% respiratory variability, suggesting right atrial pressure of 15 mmHg. Comparison(s): Changes from prior study are noted. 10/06/2018: LVEF 60-65%. RVSP 93 mmHg, mildto moderate TR. FINDINGS  Left Ventricle: Left ventricular ejection fraction, by estimation, is 65 to 70%. The left ventricle has normal function. The left ventricle has no regional wall motion abnormalities. The left ventricular internal cavity size was normal in size. There is   moderate left ventricular hypertrophy. The interventricular septum is flattened in systole and diastole, consistent with right ventricular pressure and volume overload. Left ventricular diastolic parameters are indeterminate.  Right Ventricle: The right ventricular size is normal. No increase in right ventricular wall thickness. Right ventricular systolic function is mildly reduced. There is severely elevated pulmonary artery systolic pressure. The tricuspid regurgitant velocity is 4.21 m/s, and with an assumed right atrial pressure of 15 mmHg, the estimated right ventricular systolic pressure is 83.6 mmHg. Left Atrium: Left atrial size was mildly dilated. Right Atrium: Right atrial size was severely dilated. Pericardium: There is no evidence of pericardial effusion. Mitral Valve: The mitral valve is abnormal. Mild to moderate mitral annular calcification. Mild to moderate mitral valve regurgitation. Tricuspid Valve: The tricuspid valve is abnormal. Tricuspid valve regurgitation is severe. The flow in the hepatic veins is reversed during ventricular systole. Aortic Valve: Valve area measures 0.98 cm2 by planimetry. The aortic valve is calcified. Aortic valve regurgitation is mild. Aortic regurgitation PHT measures 294 msec. Moderate to severe aortic stenosis is present. Aortic valve mean gradient measures 13.0 mmHg. Aortic valve peak gradient measures 23.5 mmHg. Aortic valve area, by VTI measures 0.95 cm. Pulmonic Valve: The pulmonic valve was grossly normal. Pulmonic valve regurgitation is mild. Aorta: The aortic root and ascending aorta are structurally normal, with no evidence of dilitation. Venous: The inferior vena cava is dilated in size with less than 50% respiratory variability, suggesting right atrial pressure of 15 mmHg. IAS/Shunts: No atrial level shunt detected by color flow Doppler. Additional Comments: A n AICD wire is visualized.  LEFT VENTRICLE PLAX 2D LVIDd:         3.80 cm LVIDs:         2.20 cm LV  PW:         1.30 cm LV IVS:        1.30 cm LVOT diam:     1.60 cm LV SV:         49 LV SV Index:   28 LVOT Area:     2.01 cm  RIGHT VENTRICLE RV S prime:     6.73 cm/s TAPSE (M-mode): 1.3 cm LEFT ATRIUM             Index       RIGHT ATRIUM           Index LA diam:        5.20 cm 2.98 cm/m  RA Area:     27.40 cm LA Vol (A2C):   80.3 ml 46.03 ml/m RA Volume:   93.70 ml  53.72 ml/m LA Vol (A4C):   34.6 ml 19.84 ml/m LA Biplane Vol: 53.6 ml 30.73 ml/m  AORTIC VALVE AV Area (Vmax):    0.97 cm AV Area (Vmean):   1.02 cm AV Area (VTI):     0.95 cm AV Vmax:           242.33 cm/s AV Vmean:          170.000 cm/s AV VTI:            0.518 m AV Peak Grad:      23.5 mmHg AV Mean Grad:      13.0 mmHg LVOT Vmax:         117.00 cm/s LVOT Vmean:        85.900 cm/s LVOT VTI:          0.246 m LVOT/AV VTI ratio: 0.47 AI PHT:            294 msec  AORTA Ao Root diam: 2.90 cm Ao Asc diam:  2.80 cm MR PISA:  2.26 cm TRICUSPID VALVE MR PISA Radius: 0.60 cm  TR Peak grad:   70.9 mmHg                          TR Vmax:        421.00 cm/s                           SHUNTS                          Systemic VTI:  0.25 m                          Systemic Diam: 1.60 cm Lyman Bishop MD Electronically signed by Lyman Bishop MD Signature Date/Time: 03/27/2020/3:27:57 PM    Final       Medications:     Scheduled Medications: . ezetimibe  10 mg Oral Daily  . levothyroxine  25 mcg Oral Q0600  . pantoprazole  40 mg Oral Q0600  . rosuvastatin  40 mg Oral Daily     Infusions:   PRN Medications:  acetaminophen **OR** acetaminophen, ondansetron **OR** ondansetron (ZOFRAN) IV, polyethylene glycol    Patient Profile   CASIE STURGEON is an 86 y.o.withhistory of permanent atrial fibrillation, chronic diastolic CHF, CKD stage 3, smoking/prior COPD, renal cell CA s/p right nephrectomy. She has a prior history of HFrEF with nonischemic cardiomyopathy and had a Medtronic ICD placed. However, subsequently her EF has  recovered. More recently, she has had symptomatic HFpEF. She came to the ER for evaluation.  HS-TnI 28=>26.  Hgb 6.2 and FOBT+, she received 2 units PRBCs and Lasix 80 mg IV.  INR was 4.7, she received po vitamin K.   Assessment/Plan   1. Anemia:  Baseline Hgb 8-10 with hx of Fe deficiency anemia. 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  She has been getting regular Feraheme infusions. Present with Hgb 6.2, positive FOBT with supratherapeutic INR. She has received 2.5 mg vitamin K and 2 units of PRBCs.  - Today, hgb 7.4, INR 4.3; Reports large amount of black tarry stool yesterday.  - GI consulted, evaluate for endoscopy.  - She has not had a major GI event for about a year prior to this.  Given high risk for CVA from AF, would favor eventually restarting her on Eliquis rather than warfarin when current bleeding issues have been resolved.  Would use 2.5 mg bid.  2. Acute on chronic diastolic CHF: Echo in 6/83 showed EF 60-65%, mild RV dilation with normal systolic function, PASP 93 mmHg. She had remote nonischemic cardiomyopathy with recovery of EF, has Medtronic ICD. RHC11/20showed elevated filling pressures and pulmonary venous hypertension. Prominent v-waves on PCWP tracing likely due to stiff ventricle/diastolic dysfunction as she had only mild MR on echo. - Echo 03/27/2020 showed EF 65-70%, mod LVH, severe TR, mild-moderate MR, mild AR and mod-severe AS.   - Today, no documentation of wts, and I&Os likely inaccurate - Will resume home regimen of torsemide 3. Pulmonary hypertension: Based on RHC11/20, she has primarily pulmonary venous hypertension.  4. CKD: Stage 3. hx of RCC s/p R nephrectomy.  Baseline Cr around 1.3-1.4.  Today, Cr 1.81 with BUN 58, likely d/t bleeding, reports good UOP - Follow BMET closely.  6. Atrial fibrillation: Permanent now. Rate control is reasonable. She has failed amiodarone, Tikosyn, and flecainide  as well as multiple cardioversions.  -  Hold warfarin.  Yesterday: INR 4.7, given vitamin K 2.5 mg. Today: INR 4.3, additional dose of vitamin K 2.5 ordered.   - See discussion above => would like to get her back on anticoagulation eventually, but would use Eliquis 2.5 mg bid when restarted.  7. Chest pain:  Atypical chest pain, associated sometimes with cough, not clearly exertional.  Hs-TnI slightly elevated with no trend.  Possible demand ischemia from anemia and volume overload. Think unlikely this is ACS.   - Would not work up further at this time.    Length of Stay: 0  Carlene Coria, NP  03/28/2020, 7:58 AM   Advanced Heart Failure Team Pager [phone] (M-F; 7a - 4p)  Please contact [facility] for night-coverage after hours (4p -7a ) and weekends on [email or web address]  Patient seen with NP, agree with the above note.    Had 3rd unit of PRBCs today, suspect she has had transfusion reactions with each unit, worse with the most recent unit.  Hgb 7.4 pre-transfusion today.   She got IV Lasix yesterday, creatinine stable at 1.8.  REDS clip 39%.   I reviewed echo: EF 60-65% with moderate LVH, D-shaped septum with mildly decreased RV function, severe elevation in PASP, suspect aortic stenosis is moderate, mild-moderate MR, severe TR. IVC dilated.   General: NAD Neck: JVP 14 cm, no thyromegaly or thyroid nodule.  Lungs: Decreased at bases.  CV: Nondisplaced PMI.  Heart irregular S1/S2, no S3/S4, 2/6 SEM with clear S2.  Trace ankle edema.   Abdomen: Soft, nontender, no hepatosplenomegaly, no distention.  Skin: Intact without lesions or rashes.  Neurologic: Alert and oriented x 3.  Psych: Normal affect. Extremities: No clubbing or cyanosis.  HEENT: Normal.   With dilated IVC on echo, exam, and elevated REDS reading, patient appears to still be volume overloaded.  Creatinine stable at 1.8.  - Lasix 80 mg IV bid today (rather than resuming home torsemide).    As above, would favor endoscopic workup of bleeding.  INR > 4 today,  she has had a total of 5 mg vitamin K at this point.  Will have endoscopy when INR < 1.5.  As this has been her only hospitalization with bleeding since last year, would favor eventual transition off warfarin and onto apixaban 2.5 mg bid.   Suspect she had transfusion reaction from blood, now feeling better.   By echo, I think she has moderate AS along with mild-moderate MR and severe TR.  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 PASP very high at 86. Prior RHC suggested pulmonary venous hypertension.   Loralie Champagne 03/28/2020 2:11 PM

## 2020-03-28 NOTE — Progress Notes (Signed)
° °  Called to see the patient after a syncopal episode.  Discussed with the nurse who witnessed this.  She was sitting on the side of the bed and had very brief LOC lasting a couple of seconds.  No residual deficits or complaints.  Tele with atrial fib.  No significant arrhythmias.  Vitals per nursing unremarkable.  Primary team alerted.

## 2020-03-29 ENCOUNTER — Inpatient Hospital Stay (HOSPITAL_COMMUNITY): Payer: PPO

## 2020-03-29 DIAGNOSIS — D649 Anemia, unspecified: Secondary | ICD-10-CM | POA: Diagnosis not present

## 2020-03-29 DIAGNOSIS — I5033 Acute on chronic diastolic (congestive) heart failure: Secondary | ICD-10-CM | POA: Diagnosis not present

## 2020-03-29 DIAGNOSIS — R404 Transient alteration of awareness: Secondary | ICD-10-CM | POA: Diagnosis not present

## 2020-03-29 LAB — TYPE AND SCREEN
ABO/RH(D): A NEG
Antibody Screen: POSITIVE
Donor AG Type: NEGATIVE
Donor AG Type: NEGATIVE
Donor AG Type: NEGATIVE
Unit division: 0
Unit division: 0
Unit division: 0

## 2020-03-29 LAB — BASIC METABOLIC PANEL
Anion gap: 13 (ref 5–15)
BUN: 62 mg/dL — ABNORMAL HIGH (ref 8–23)
CO2: 25 mmol/L (ref 22–32)
Calcium: 8.2 mg/dL — ABNORMAL LOW (ref 8.9–10.3)
Chloride: 96 mmol/L — ABNORMAL LOW (ref 98–111)
Creatinine, Ser: 1.92 mg/dL — ABNORMAL HIGH (ref 0.44–1.00)
GFR, Estimated: 25 mL/min — ABNORMAL LOW (ref >60)
Glucose, Bld: 101 mg/dL — ABNORMAL HIGH (ref 70–99)
Potassium: 4.2 mmol/L (ref 3.5–5.1)
Sodium: 134 mmol/L — ABNORMAL LOW (ref 135–145)

## 2020-03-29 LAB — BPAM RBC
Blood Product Expiration Date: 202112072359
Blood Product Expiration Date: 202112102359
Blood Product Expiration Date: 202112132359
ISSUE DATE / TIME: 202111220539
ISSUE DATE / TIME: 202111220950
ISSUE DATE / TIME: 202111231000
Unit Type and Rh: 600
Unit Type and Rh: 600
Unit Type and Rh: 600

## 2020-03-29 LAB — CBC
HCT: 24.3 % — ABNORMAL LOW (ref 36.0–46.0)
Hemoglobin: 7.4 g/dL — ABNORMAL LOW (ref 12.0–15.0)
MCH: 26.8 pg (ref 26.0–34.0)
MCHC: 30.5 g/dL (ref 30.0–36.0)
MCV: 88 fL (ref 80.0–100.0)
Platelets: 196 10*3/uL (ref 150–400)
RBC: 2.76 MIL/uL — ABNORMAL LOW (ref 3.87–5.11)
RDW: 18.6 % — ABNORMAL HIGH (ref 11.5–15.5)
WBC: 4.8 10*3/uL (ref 4.0–10.5)
nRBC: 1.9 % — ABNORMAL HIGH (ref 0.0–0.2)

## 2020-03-29 LAB — FERRITIN: Ferritin: 33 ng/mL (ref 11–307)

## 2020-03-29 LAB — IRON AND TIBC
Iron: 42 ug/dL (ref 28–170)
Saturation Ratios: 12 % (ref 10.4–31.8)
TIBC: 358 ug/dL (ref 250–450)
UIBC: 316 ug/dL

## 2020-03-29 LAB — PROTIME-INR
INR: 2.1 — ABNORMAL HIGH (ref 0.8–1.2)
Prothrombin Time: 22.7 seconds — ABNORMAL HIGH (ref 11.4–15.2)

## 2020-03-29 LAB — VITAMIN B12: Vitamin B-12: 370 pg/mL (ref 180–914)

## 2020-03-29 LAB — RETICULOCYTES
Immature Retic Fract: 44.8 % — ABNORMAL HIGH (ref 2.3–15.9)
RBC.: 2.72 MIL/uL — ABNORMAL LOW (ref 3.87–5.11)
Retic Count, Absolute: 227.9 10*3/uL — ABNORMAL HIGH (ref 19.0–186.0)
Retic Ct Pct: 8.4 % — ABNORMAL HIGH (ref 0.4–3.1)

## 2020-03-29 LAB — FOLATE: Folate: 18.6 ng/mL (ref >5.9)

## 2020-03-29 NOTE — Progress Notes (Signed)
ReDS Clip  Reading = 25%  CHEST RULER = [place] = C  Antonietta Jewel, PharmD, BCCCP Clinical Pharmacist  Phone: [phone] 03/29/2020 11:11 AM  Please check AMION for all [place] phone numbers After 10:00 PM, call [place] [phone]

## 2020-03-29 NOTE — Progress Notes (Signed)
Neurology Consultation Reason for Consult: Concern for seizure Referring Physician: Benny Lennert, a  CC: Concern for seizure  History is obtained from: Patient, daughter  HPI: [name] is a 86 y.o. female with a history of atrial fibrillation who presents with acute blood loss anemia and was sitting on the side of the bed earlier today when she had a sudden behavioral arrest.  She states that she remembers sitting on the side of the bed, and then the next thing she knows nurses were coming into making a fuss over her.  She is not aware of any previous episodes.   I also discussed with the daughter, and apparently the patient was having a relatively normal conversation when she suddenly stopped responding and had a blank look.  The daughter said "mama?"  Three or four times and it was at that point that the patient fell forwards and was relatively unconscious.  Daughter got a hold of the nurses relatively quickly and they came in and it was on the nurses arrival that the patient began to come around, without significant postictal state.  ROS: A 14 point ROS was performed and is negative except as noted in the HPI.   Past Medical History:  Diagnosis Date  . Adjustment disorder with anxiety   . Anemia   . Anginal pain ([place])   . Arthritis    "some in my hands" (11/11/2012)  . Atrial fibrillation ([place])   . Automatic implantable cardioverter-defibrillator in situ   . Blood in stool   . Carotid artery stenosis 09/2007   a. 09/2007: 60-79% bilateral (stable); b. 10/2008: 40-59% R 60-79%   . Chest pain, unspecified   . Chronic airway obstruction, not elsewhere classified   . Chronic bronchitis ([place])    "get it some; not q year" (11/11/2012)  . Chronic diastolic CHF (congestive heart failure) ([place]) 06/04/2013  . Chronic kidney disease, unspecified   . Coronary artery disease    non-obstructive by 2006 cath  . Frequent UTI    "get them a couple times/yr" (11/11/2012)  . Hemorrhage of rectum and  anus   . High cholesterol   . History of blood transfusion 04/2011   "after hip OR" (11/11/2012)  . Hx of renal cell cancer   . Hypertension 05/20/2011  . Hypertr obst cardiomyop   . Hypotension, unspecified    cardiac cath 2006..nonobstructive CAD 30-40s lesions.Marland KitchenETT 1/09 nondiagnostic due to poor HR response..Right Renal Cancer 2003  . Long term (current) use of anticoagulants   . Malignant neoplasm of kidney, except pelvis   . Osteoarthritis of right hip   . Other and unspecified coagulation defects   . PONV (postoperative nausea and vomiting)   . Renal cancer ([place]) 06/2001   Right  . Secondary cardiomyopathy, unspecified   . Sinus bradycardia   . Urge incontinence      Family History  Problem Relation Age of Onset  . Heart failure Mother   . Breast cancer Maternal Aunt 82  . Breast cancer Cousin   . Breast cancer Other   . Colon cancer Neg Hx      Social History:  reports that she quit smoking about 18 years ago. Her smoking use included cigarettes. She has a 20.00 pack-year smoking history. She has never used smokeless tobacco. She reports that she does not drink alcohol and does not use drugs.   Exam: Current vital signs: BP (!) 94/56 (BP Location: Right Arm)   Pulse 65   Temp 98.2 F (36.8  C) (Oral)   Resp 19   Ht 5\' 6"  (1.676 m)   Wt 68.6 kg   SpO2 91%   BMI 24.41 kg/m  Vital signs in last 24 hours: Temp:  [97.7 F (36.5 C)-98.2 F (36.8 C)] 98.2 F (36.8 C) (11/24 2141) Pulse Rate:  [62-65] 65 (11/24 2141) Resp:  [16-19] 19 (11/24 2141) BP: (94-120)/(51-59) 94/56 (11/24 2141) SpO2:  [91 %-94 %] 91 % (11/24 2141) Weight:  [68.6 kg] 68.6 kg (11/24 1300)   Physical Exam  Constitutional: Appears well-developed and well-nourished.  Psych: Affect appropriate to situation Eyes: No scleral injection HENT: No OP obstrucion MSK: no joint deformities.  Cardiovascular: Normal rate and regular rhythm.  Respiratory: Effort normal, non-labored breathing GI:  Soft.  No distension. There is no tenderness.  Skin: WDI  Neuro: Mental Status: Patient is awake, alert, oriented to person, place, month, year, and situation. Patient is able to give a clear and coherent history. No signs of aphasia or neglect Cranial Nerves: II: Visual Fields are full. Pupils are equal, round, and reactive to light.   III,IV, VI: EOMI without ptosis or diploplia.  V: Facial sensation is symmetric to temperature VII: Facial movement is symmetric.  VIII: hearing is intact to voice X: Uvula elevates symmetrically XI: Shoulder shrug is symmetric. XII: tongue is midline without atrophy or fasciculations.  Motor: Tone is normal. Bulk is normal. 5/5 strength was present in all four extremities.  Sensory: Sensation is symmetric to light touch and temperature in the arms and legs. Cerebellar: FNF intact bilaterally   I have reviewed labs in epic and the results pertinent to this consultation are: Hemoglobin 7.4  I have reviewed the images obtained: CT head-unremarkable  Impression: 86 year old female with a syncopal episode.  Given the initial prodrome of behavioral arrest, the possibility of partial seizure has been raised, but I think this is relatively unlikely.  I think more likely it is a combination of blood pressure fluctuation with severe anemia causing relative cerebral hypoperfusion.  She cannot get an MRI, but we will get an EEG.  If the EEG is negative, no further work-up needed from a neurological perspective.  Recommendations: 1) EEG 2) if EEG is without epileptiform activity, no further recommendations from a neurology standpoint.   Roland Rack, MD [facility] [phone]  If 7pm- 7am, please page neurology on call as listed in [place].

## 2020-03-29 NOTE — Progress Notes (Signed)
Discussed consultation w/ neurology. They would like to get a brain MRI. Unfortunately she has an ICD that is not MRI compatible (confirmed w/ EP). Neuro team notified.   Lyda Jester, PA-C

## 2020-03-29 NOTE — Progress Notes (Signed)
Daily Rounding Note  03/29/2020, 11:35 AM  LOS: 1 day   SUBJECTIVE:   Chief complaint:  anemia   Feels better than yesterday.  No GI complaints  OBJECTIVE:         Vital signs in last 24 hours:    Temp:  [97.6 F (36.4 C)-98.8 F (37.1 C)] 98.2 F (36.8 C) (11/24 0547) Pulse Rate:  [64-65] 64 (11/24 0547) Resp:  [18-27] 18 (11/24 0547) BP: (104-149)/(42-87) 104/59 (11/24 0547) SpO2:  [88 %-100 %] 94 % (11/24 0547) Last BM Date: 03/28/20 Filed Weights   03/26/20 2342  Weight: 65.8 kg   General: frail, chronically ill, comfortable    Heart: Irreg, irreg Chest: a few fine crackles in left base.  No dyspnea or cough.   Abdomen: soft, NT, ND.  Active BS  Extremities: no CCE Neuro/Psych:  Alert.  Oriented x 3.  Appropriate.  No tremors.    Intake/Output from previous day: 11/23 0701 - 11/24 0700 In: 298 [Blood:298] Out: -   Intake/Output this shift: No intake/output data recorded.  Lab Results: Recent Labs    03/28/20 0245 03/28/20 1343 03/29/20 0732  WBC 5.6 5.9 4.8  HGB 7.4* 7.9* 7.4*  HCT 24.7* 25.4* 24.3*  PLT 217 204 196   BMET Recent Labs    03/27/20 1555 03/28/20 0245 03/29/20 0214  NA 135 135 134*  K 3.8 3.9 4.2  CL 95* 99 96*  CO2 29 26 25   GLUCOSE 118* 101* 101*  BUN 55* 58* 62*  CREATININE 1.89* 1.81* 1.92*  CALCIUM 8.2* 8.3* 8.2*   LFT Recent Labs    03/27/20 0001 03/27/20 1555  PROT 5.8* 5.3*  ALBUMIN 3.5 3.0*  AST 17 21  ALT 12 11  ALKPHOS 40 35*  BILITOT 0.9 1.7*   PT/INR Recent Labs    03/28/20 0245 03/29/20 0214  LABPROT 40.1* 22.7*  INR 4.3* 2.1*   Hepatitis Panel No results for input(s): HEPBSAG, HCVAB, HEPAIGM, HEPBIGM in the last 72 hours.  Studies/Results: CT HEAD WO CONTRAST  Result Date: 03/28/2020 CLINICAL DATA:  Syncopal episode EXAM: CT HEAD WITHOUT CONTRAST TECHNIQUE: Contiguous axial images were obtained from the base of the skull through  the vertex without intravenous contrast. COMPARISON:  February 24, 2019 FINDINGS: Brain: There is no acute intracranial hemorrhage, mass effect, or edema. Gray-white differentiation is preserved. There is no extra-axial fluid collection. Ventricles and sulci are within normal limits in size and configuration. Patchy hypoattenuation in the supratentorial white matter is nonspecific but may reflect mild chronic microvascular ischemic changes similar to the prior study. Vascular: There is atherosclerotic calcification at the skull base. Skull: Calvarium is unremarkable. Sinuses/Orbits: No acute finding. Other: None. IMPRESSION: No acute intracranial abnormality. Electronically Signed   By: Macy Mis M.D.   On: 03/28/2020 20:27   ECHOCARDIOGRAM COMPLETE  Result Date: 03/27/2020 IMPRESSIONS  1. Left ventricular ejection fraction, by estimation, is 65 to 70%. The left ventricle has normal function. The left ventricle has no regional wall motion abnormalities. There is moderate left ventricular hypertrophy. Left ventricular diastolic parameters are indeterminate. There is the interventricular septum is flattened in systole and diastole, consistent with right ventricular pressure and volume overload.  2. Right ventricular systolic function is mildly reduced. The right ventricular size is normal. A n AICD wire is visualized. There is severely elevated pulmonary artery systolic pressure. The estimated right ventricular systolic pressure is 70.0 mmHg.  3. Left atrial size was mildly  dilated.  4. Right atrial size was severely dilated.  5. The mitral valve is abnormal. Mild to moderate mitral valve regurgitation.  6. The tricuspid valve is abnormal. Tricuspid valve regurgitation is severe.  7. Valve area measures 0.98 cm2 by planimetry. The aortic valve is calcified. Aortic valve regurgitation is mild. Moderate to severe aortic valve stenosis. Aortic valve area, by VTI measures 0.95 cm. Aortic valve mean gradient  measures 13.0 mmHg. Aortic valve Vmax measures 2.42 m/s.  8. The inferior vena cava is dilated in size with <50% respiratory variability, suggesting right atrial pressure of 15 mmHg. Comparison(s): Changes from prior study are noted. 10/06/2018: LVEF 60-65%. RVSP 93 mmHg, mildto moderate TR.  Electronically signed by Lyman Bishop MD Signature Date/Time: 03/27/2020/3:27:57 PM    Final     ASSESMENT:   *   Recurrent acute on chronic anemia.  Despite bid oral iron.  FOBT +.  Assume anemia secondary to GI blood loss though has not had any assess of overt blood loss. Receives Feraheme, CSA infusions per hematologist. EGD, colonoscopy, capsule endoscopy in October/November 2020.  Had erosive gastritis, chronic ischemic colitis, nonbleeding duodenal AVM with small streak of blood downstream from this.  No subsequent attempts at enteroscopy. Hgb 6.2 >> 2 PRBCs >> 8 >> 7.4 >> partial unit of 3rd PRBC >> 7.9 >> 7.4.   b 12, folated, iron ok.  Iron sats low normal.  Ferritin 25.    *   Adverse reaction to transfusion, resolved.    *    Chronic Coumadin for A. fib.  INR 4.7 >> 4.3 >> 2.1.    Got po Vit K yesterday 0330 and 0520 this AM.    *  CKD stage 3.   AKI.      PLAN   *   Start HH diet.    *   INR in AM.    *   Entersocopy, timing TBD.        Azucena Freed  03/29/2020, 11:35 AM Phone [phone]

## 2020-03-29 NOTE — Plan of Care (Signed)
  Problem: Education: Goal: Knowledge of General Education information will improve Description Including pain rating scale, medication(s)/side effects and non-pharmacologic comfort measures Outcome: Progressing   

## 2020-03-29 NOTE — H&P (View-Only) (Signed)
Daily Rounding Note  03/29/2020, 11:35 AM  LOS: 1 day   SUBJECTIVE:   Chief complaint:  anemia   Feels better than yesterday.  No GI complaints  OBJECTIVE:         Vital signs in last 24 hours:    Temp:  [97.6 F (36.4 C)-98.8 F (37.1 C)] 98.2 F (36.8 C) (11/24 0547) Pulse Rate:  [64-65] 64 (11/24 0547) Resp:  [18-27] 18 (11/24 0547) BP: (104-149)/(42-87) 104/59 (11/24 0547) SpO2:  [88 %-100 %] 94 % (11/24 0547) Last BM Date: 03/28/20 Filed Weights   03/26/20 2342  Weight: 65.8 kg   General: frail, chronically ill, comfortable    Heart: Irreg, irreg Chest: a few fine crackles in left base.  No dyspnea or cough.   Abdomen: soft, NT, ND.  Active BS  Extremities: no CCE Neuro/Psych:  Alert.  Oriented x 3.  Appropriate.  No tremors.    Intake/Output from previous day: 11/23 0701 - 11/24 0700 In: 298 [Blood:298] Out: -   Intake/Output this shift: No intake/output data recorded.  Lab Results: Recent Labs    03/28/20 0245 03/28/20 1343 03/29/20 0732  WBC 5.6 5.9 4.8  HGB 7.4* 7.9* 7.4*  HCT 24.7* 25.4* 24.3*  PLT 217 204 196   BMET Recent Labs    03/27/20 1555 03/28/20 0245 03/29/20 0214  NA 135 135 134*  K 3.8 3.9 4.2  CL 95* 99 96*  CO2 29 26 25   GLUCOSE 118* 101* 101*  BUN 55* 58* 62*  CREATININE 1.89* 1.81* 1.92*  CALCIUM 8.2* 8.3* 8.2*   LFT Recent Labs    03/27/20 0001 03/27/20 1555  PROT 5.8* 5.3*  ALBUMIN 3.5 3.0*  AST 17 21  ALT 12 11  ALKPHOS 40 35*  BILITOT 0.9 1.7*   PT/INR Recent Labs    03/28/20 0245 03/29/20 0214  LABPROT 40.1* 22.7*  INR 4.3* 2.1*   Hepatitis Panel No results for input(s): HEPBSAG, HCVAB, HEPAIGM, HEPBIGM in the last 72 hours.  Studies/Results: CT HEAD WO CONTRAST  Result Date: 03/28/2020 CLINICAL DATA:  Syncopal episode EXAM: CT HEAD WITHOUT CONTRAST TECHNIQUE: Contiguous axial images were obtained from the base of the skull through  the vertex without intravenous contrast. COMPARISON:  February 24, 2019 FINDINGS: Brain: There is no acute intracranial hemorrhage, mass effect, or edema. Gray-white differentiation is preserved. There is no extra-axial fluid collection. Ventricles and sulci are within normal limits in size and configuration. Patchy hypoattenuation in the supratentorial white matter is nonspecific but may reflect mild chronic microvascular ischemic changes similar to the prior study. Vascular: There is atherosclerotic calcification at the skull base. Skull: Calvarium is unremarkable. Sinuses/Orbits: No acute finding. Other: None. IMPRESSION: No acute intracranial abnormality. Electronically Signed   By: Macy Mis M.D.   On: 03/28/2020 20:27   ECHOCARDIOGRAM COMPLETE  Result Date: 03/27/2020 IMPRESSIONS  1. Left ventricular ejection fraction, by estimation, is 65 to 70%. The left ventricle has normal function. The left ventricle has no regional wall motion abnormalities. There is moderate left ventricular hypertrophy. Left ventricular diastolic parameters are indeterminate. There is the interventricular septum is flattened in systole and diastole, consistent with right ventricular pressure and volume overload.  2. Right ventricular systolic function is mildly reduced. The right ventricular size is normal. A n AICD wire is visualized. There is severely elevated pulmonary artery systolic pressure. The estimated right ventricular systolic pressure is 29.5 mmHg.  3. Left atrial size was mildly  dilated.  4. Right atrial size was severely dilated.  5. The mitral valve is abnormal. Mild to moderate mitral valve regurgitation.  6. The tricuspid valve is abnormal. Tricuspid valve regurgitation is severe.  7. Valve area measures 0.98 cm2 by planimetry. The aortic valve is calcified. Aortic valve regurgitation is mild. Moderate to severe aortic valve stenosis. Aortic valve area, by VTI measures 0.95 cm. Aortic valve mean gradient  measures 13.0 mmHg. Aortic valve Vmax measures 2.42 m/s.  8. The inferior vena cava is dilated in size with <50% respiratory variability, suggesting right atrial pressure of 15 mmHg. Comparison(s): Changes from prior study are noted. 10/06/2018: LVEF 60-65%. RVSP 93 mmHg, mildto moderate TR.  Electronically signed by Lyman Bishop MD Signature Date/Time: 03/27/2020/3:27:57 PM    Final     ASSESMENT:   *   Recurrent acute on chronic anemia.  Despite bid oral iron.  FOBT +.  Assume anemia secondary to GI blood loss though has not had any assess of overt blood loss. Receives Feraheme, CSA infusions per hematologist. EGD, colonoscopy, capsule endoscopy in October/November 2020.  Had erosive gastritis, chronic ischemic colitis, nonbleeding duodenal AVM with small streak of blood downstream from this.  No subsequent attempts at enteroscopy. Hgb 6.2 >> 2 PRBCs >> 8 >> 7.4 >> partial unit of 3rd PRBC >> 7.9 >> 7.4.   b 12, folated, iron ok.  Iron sats low normal.  Ferritin 25.    *   Adverse reaction to transfusion, resolved.    *    Chronic Coumadin for A. fib.  INR 4.7 >> 4.3 >> 2.1.    Got po Vit K yesterday 0330 and 0520 this AM.    *  CKD stage 3.   AKI.      PLAN   *   Start HH diet.    *   INR in AM.    *   Entersocopy, timing TBD.        Azucena Freed  03/29/2020, 11:35 AM Phone [phone]

## 2020-03-29 NOTE — Progress Notes (Addendum)
Advanced Heart Failure Rounding Note  PCP-Cardiologist: Virl Axe, MD   Subjective:    Consulted for CHF evaluation by Dr. Starla Link. Presented with Hgb 6.2, INR 4.7, received a total of 3 units of PRBCs, and 5 mg of vitamin K.    Yesterday: Likely experienced transfusion reaction with blood; only received 1 dose of IV lasix 80 mg (2nd dose not given d/t BP).  Later in evening had presyncopal event per patient while sitting.  CT brain showed no acute findings.  Today: Denies shortness of breath, abd pain.  States feels some swelling in lower extremity when she flexes ankle.  REDS clip 25%.   Objective:   Weight Range: 65.8 kg Body mass index is 23.4 kg/m.   Vital Signs:   Temp:  [97.6 F (36.4 C)-98.8 F (37.1 C)] 98.2 F (36.8 C) (11/24 0547) Pulse Rate:  [62-65] 64 (11/24 0547) Resp:  [18-27] 18 (11/24 0547) BP: (104-149)/(42-87) 104/59 (11/24 0547) SpO2:  [88 %-100 %] 94 % (11/24 0547) Last BM Date: 03/26/20  Weight change: Filed Weights   03/26/20 2342  Weight: 65.8 kg    Intake/Output:   Intake/Output Summary (Last 24 hours) at 03/29/2020 0859 Last data filed at 03/28/2020 1236 Gross per 24 hour  Intake 298 ml  Output --  Net 298 ml      Physical Exam    General:  Pale. No resp difficulty HEENT: normal, dry mucous membranes Neck: supple. + JVD. Carotids 2+ bilat; no bruits. No lymphadenopathy or thryomegaly appreciated. Cor: PMI nondisplaced. Iregular rate & rhythm. No rubs, or gallops, + murmurs. Lungs: clear bilaterally.  Abdomen: soft, nontender, nondistended. No hepatosplenomegaly. No bruits or masses. Good bowel sounds. Extremities: no cyanosis, clubbing, rash. Trace ankle edema.  Neuro: alert & oriented x 3, cranial nerves grossly intact. moves all 4 extremities w/o difficulty. Affect pleasant  Telemetry   Afib, paced intermittently rates 70s.  Personally reviewed.    EKG    Atrial fibrillation, QTc 493, rate 74.    Labs     CBC Recent Labs    03/27/20 0001 03/27/20 1555 03/28/20 0245 03/28/20 0245 03/28/20 1343 03/29/20 0732  WBC 5.1   < > 5.6   < > 5.9 4.8  NEUTROABS 3.6  --  4.2  --   --   --   HGB 6.2*   < > 7.4*   < > 7.9* 7.4*  HCT 21.7*   < > 24.7*   < > 25.4* 24.3*  MCV 93.1   < > 88.5   < > 88.8 88.0  PLT 262   < > 217   < > 204 196   < > = values in this interval not displayed.   Basic Metabolic Panel Recent Labs    03/28/20 0245 03/29/20 0214  NA 135 134*  K 3.9 4.2  CL 99 96*  CO2 26 25  GLUCOSE 101* 101*  BUN 58* 62*  CREATININE 1.81* 1.92*  CALCIUM 8.3* 8.2*  MG 2.2  --    Liver Function Tests Recent Labs    03/27/20 0001 03/27/20 1555  AST 17 21  ALT 12 11  ALKPHOS 40 35*  BILITOT 0.9 1.7*  PROT 5.8* 5.3*  ALBUMIN 3.5 3.0*   No results for input(s): LIPASE, AMYLASE in the last 72 hours. Cardiac Enzymes No results for input(s): CKTOTAL, CKMB, CKMBINDEX, TROPONINI in the last 72 hours.  BNP: BNP (last 3 results) Recent Labs    06/03/19 1508 03/27/20 0001  BNP 386.1* 252.8*    ProBNP (last 3 results) Recent Labs    05/06/19 1201  PROBNP 431.0*     D-Dimer No results for input(s): DDIMER in the last 72 hours. Hemoglobin A1C No results for input(s): HGBA1C in the last 72 hours. Fasting Lipid Panel No results for input(s): CHOL, HDL, LDLCALC, TRIG, CHOLHDL, LDLDIRECT in the last 72 hours. Thyroid Function Tests No results for input(s): TSH, T4TOTAL, T3FREE, THYROIDAB in the last 72 hours.  Invalid input(s): FREET3  Other results:   Imaging    CT HEAD WO CONTRAST  Result Date: 03/28/2020 CLINICAL DATA:  Syncopal episode EXAM: CT HEAD WITHOUT CONTRAST TECHNIQUE: Contiguous axial images were obtained from the base of the skull through the vertex without intravenous contrast. COMPARISON:  February 24, 2019 FINDINGS: Brain: There is no acute intracranial hemorrhage, mass effect, or edema. Gray-white differentiation is preserved. There is no  extra-axial fluid collection. Ventricles and sulci are within normal limits in size and configuration. Patchy hypoattenuation in the supratentorial white matter is nonspecific but may reflect mild chronic microvascular ischemic changes similar to the prior study. Vascular: There is atherosclerotic calcification at the skull base. Skull: Calvarium is unremarkable. Sinuses/Orbits: No acute finding. Other: None. IMPRESSION: No acute intracranial abnormality. Electronically Signed   By: Macy Mis M.D.   On: 03/28/2020 20:27     Medications:     Scheduled Medications: . ezetimibe  10 mg Oral Daily  . furosemide  80 mg Intravenous BID  . levothyroxine  25 mcg Oral Q0600  . pantoprazole  40 mg Oral Q0600  . rosuvastatin  40 mg Oral Daily    Infusions:   PRN Medications: acetaminophen **OR** acetaminophen, ondansetron **OR** ondansetron (ZOFRAN) IV, polyethylene glycol    Patient Profile   JILL STOPKA is an 86 y.o.withhistory of permanent atrial fibrillation, chronic diastolic CHF, CKD stage 3, smoking/prior COPD, renal cell CA s/p right nephrectomy. She has a prior history of HFrEF with nonischemic cardiomyopathy and had a [employer]onic ICD placed. However, subsequently her EF has recovered. More recently, she has had symptomatic HFpEF. She came to the ER for evaluation.  HS-TnI 28=>26.  Hgb 6.2 and FOBT+, she received 2 units PRBCs and Lasix 80 mg IV.  INR was 4.7, she received po vitamin K.   Assessment/Plan   1. Anemia:  Baseline Hgb 8-10 with hx of Fe deficiency anemia. 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  She has been getting regular Feraheme infusions. Present with Hgb 6.2, positive FOBT with supratherapeutic INR. She has received total of 5 mg vitamin K and 3 units of PRBCs.  - Today, Hgb 7.4,INR 2.1 - GI consulted, evaluate for endoscopy.  - No major GI event for about a year prior to this.  Given high risk for CVA from AF, would favor eventually  restarting her on Eliquis rather than warfarin when current bleeding issues have been resolved.  Would use 2.5 mg bid.  2. Acute on chronic diastolic CHF: Echo in 2/58 showed EF 60-65%, mild RV dilation with normal systolic function, PASP 93 mmHg. She had remote nonischemic cardiomyopathy with recovery of EF, has Medtronic ICD. RHC11/20showed elevated filling pressures and pulmonary venous hypertension. Prominent v-waves on PCWP tracing likely due to stiff ventricle/diastolic dysfunction as she had only mild MR on echo. - Echo 03/27/2020 showed EF 65-70%, mod LVH, moderate AS,  mild-moderate MR and severe TR.  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 PASP very high at 86. - ReDS  clip 39% yesterday - Today, no documentation of wts, and I&Os, difficult to dose diuretics without this data. Creatinine is starting to trend up with low normotensive Bps and syncopable event with mod AS. REDS 25%.  Hold diuretic for now, may consider dose later this afternoon.    3. Pulmonary hypertension: Based on RHC11/20, she has primarily pulmonary venous hypertension.  4. CKD: Stage 3. hx of RCC s/p R nephrectomy.  Baseline Cr around 1.3-1.4.  Today, Cr 1.92 with BUN 62 likely d/t bleeding - Follow BMET closely.  6. Atrial fibrillation: Permanent now. Rate control is reasonable. She has failed amiodarone, Tikosyn, and flecainide as well as multiple cardioversions.  - Hold warfarin.  INR 4.7, given vitamin K 5 mg. - Today: INR 2.1 - See discussion above => would like to get her back on anticoagulation eventually, but would use Eliquis 2.5 mg bid when restarted.  7. Chest pain:  Atypical chest pain, associated sometimes with cough, not clearly exertional.  Hs-TnI slightly elevated with no trend.  Possible demand ischemia from anemia and volume overload. Think unlikely this is ACS.   - Would not work up further at this time.    Length of Stay: [place], NP  03/29/2020, 8:59 AM    Advanced Heart Failure Team Pager [phone] (M-F; 7a - 4p)  Please contact [facility] for night-coverage after hours (4p -7a ) and weekends on [email or web address]  Patient seen with NP, agree with the above note.    She had an episode yesterday where she was sitting in bed and was "staring into space" for about a minute without responding.  No events on telemetry and BP stable when checked.  CT head negative.  Has not recurred. Daughter witnessed.   Hgb remains stable at 7.4 today. No overt bleeding. INR 2.1.    Creatinine higher at 1.9, REDS clip 25% this morning (improved).  Unable to obtain accurate weights or I/Os on this floor.   I have reviewed her echo: EF 60-65% with moderate LVH, D-shaped septum with mildly decreased RV function, severe elevation in PASP, suspect aortic stenosis is moderate, mild-moderate MR, severe TR. IVC dilated.   General: NAD Neck: JVP 8-9 cm with prominent CV waves, no thyromegaly or thyroid nodule.  Lungs: Clear to auscultation bilaterally with normal respiratory effort. CV: Nondisplaced PMI.  Heart irregular S1/S2, no A5/W0, 2/6 systolic murmur across the precordium.  Trace ankle edema.   Abdomen: Soft, nontender, no hepatosplenomegaly, no distention.  Skin: Intact without lesions or rashes.  Neurologic: Alert and oriented x 3.  Psych: Normal affect. Extremities: No clubbing or cyanosis.  HEENT: Normal.   REDS clip down to 25% today, suspect she is going to have some degree of JVD due to severe TR.  With rise in creatinine to 1.9, agree with holding diuretic today, would restart home torsemide 100 mg bid tomorrow.    As above, would favor endoscopic workup of bleeding.  INR > 4 at admission, she has had a total of 5 mg vitamin K at this point.  Will have endoscopy when INR < 1.5 (2.1 today).  As this has been her only hospitalization with bleeding since last year, would favor eventual transition off warfarin and onto apixaban 2.5 mg bid.  Wear SCDs  while off anticoagulation.   By echo, I think she has moderate AS along with mild-moderate MR and severe TR.  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 PASP very high at 86. Prior RHC  suggested pulmonary venous hypertension.   Uncertain cause of "staring spell" yesterday evening.  Unresponsive for about a minute but telemetry and BP unremarkable (BP done just after event).  Head CT negative. Daughter witnessed.  Consider partial seizure, EEG may be helpful.  Will discuss with neurology .  Loralie Champagne 03/29/2020

## 2020-03-29 NOTE — Evaluation (Addendum)
Physical Therapy Evaluation Patient Details Name: [name] MRN: [id number] DOB: Dec 18, 1933 Today's Date: 03/29/2020   History of Present Illness  Pt is an 86 y.o. female admitted 03/26/20 with generalized weakness, SOB and lightheadedness. Workup for symptomatic anemia; (+) FOBT. Course complicated by adverse reaction to transfusion. On 11/23, pt with episode of "staring into space" observed by daughter. Head CT showed no acute findings; potential for EEG. PMH includes COPD, afib, CKD 3, CHF, ICD placement, renal cell CA.    Clinical Impression  Pt presents with an overall decrease in functional mobility secondary to above. PTA, pt mod indep with intermittent use of SPC, lives with grandson. Today, pt able to mobilize well, reliant on RW to maintain balance with standing activity; pt tolerated multiple bouts of sit<>stand and short ambulation distance to target improving LE strength and activity tolerance. 1x c/o feeling "woozy" upon standing (see BP values below). Pt would benefit from continued acute PT services to maximize functional mobility and independence prior to d/c with HHPT services.   Orthastatic BPs Sitting 93/78  Standing 110/50  Standing after 2 min 124/41  Post-ambulation 101/51       Follow Up Recommendations Home health PT (if agreeable);Supervision - Intermittent    Equipment Recommendations  None recommended by PT    Recommendations for Other Services       Precautions / Restrictions Precautions Precautions: Fall;Other (comment) Precaution Comments: "staring" episode 11/23 Restrictions Weight Bearing Restrictions: No      Mobility  Bed Mobility Overal bed mobility: Modified Independent                  Transfers Overall transfer level: Needs assistance Equipment used: 1 person hand held assist;Rolling walker (2 wheeled);None Transfers: Sit to/from Stand           General transfer comment: Multiple sit<>stands from EOB, pt reaching  for UE support upon standing to maintain balance; additional trials with RW, stability improved  Ambulation/Gait Ambulation/Gait assistance: Min guard Gait Distance (Feet): 26 Feet (+26) Assistive device: Rolling walker (2 wheeled) Gait Pattern/deviations: Step-through pattern;Decreased stride length Gait velocity: Decreased   General Gait Details: Slow, mostly steady gait with RW and min guard for balance; 1x seated rest break secondary to fatigue. Pt reports, "my legs feel so weak"  Stairs            Wheelchair Mobility    Modified Rankin (Stroke Patients Only)       Balance Overall balance assessment: Needs assistance   Sitting balance-Leahy Scale: Good     Standing balance support: No upper extremity supported;During functional activity;Bilateral upper extremity supported;Single extremity supported Standing balance-Leahy Scale: Fair Standing balance comment: Can static stand without UE support, but stability improved with UE support                             Pertinent Vitals/Pain Pain Assessment: No/denies pain    Home Living Family/patient expects to be discharged to:: Private residence Living Arrangements: Other relatives Customer service manager) Available Help at Discharge: Family;Available PRN/intermittently Type of Home: House Home Access: Stairs to enter Entrance Stairs-Rails: Right;Left;Can reach both Entrance Stairs-Number of Steps: 3 Home Layout: One level Home Equipment: Walker - 2 wheels;Cane - single point;Bedside commode;Grab bars - toilet;Grab bars - tub/shower;Hand held shower head Additional Comments: Lives with great-grandson who works and goes to school, but able to assist pt as needed in evenings    Prior Function Level of Independence: Independent with  assistive device(s)         Comments: Typically independent without DME, drives short distances and cooks/cleans; past few days has been using SPC due to weakness and reports, "I  could barely get myself to the bathroom I was so weak" requiring increased help from family     Hand Dominance        Extremity/Trunk Assessment   Upper Extremity Assessment Upper Extremity Assessment: Generalized weakness    Lower Extremity Assessment Lower Extremity Assessment: Generalized weakness       Communication   Communication: No difficulties  Cognition Arousal/Alertness: Awake/alert Behavior During Therapy: WFL for tasks assessed/performed Overall Cognitive Status: Within Functional Limits for tasks assessed                                        General Comments General comments (skin integrity, edema, etc.): Daughter present and supportive. Pt reports nausea having eaten recently, also endorses feeling "woozy" upon standing, which did not get worse with upright activity; orthostatic vitals taken    Exercises     Assessment/Plan    PT Assessment Patient needs continued PT services  PT Problem List Decreased strength;Decreased activity tolerance;Decreased balance;Decreased mobility;Decreased knowledge of use of DME;Cardiopulmonary status limiting activity       PT Treatment Interventions DME instruction;Gait training;Stair training;Functional mobility training;Therapeutic activities;Therapeutic exercise;Balance training;Patient/family education    PT Goals (Current goals can be found in the Care Plan section)  Acute Rehab PT Goals Patient Stated Goal: Get my strength back and return home PT Goal Formulation: With patient Time For Goal Achievement: 04/12/20 Potential to Achieve Goals: Good    Frequency Min 3X/week   Barriers to discharge        Co-evaluation               AM-PAC PT "6 Clicks" Mobility  Outcome Measure Help needed turning from your back to your side while in a flat bed without using bedrails?: None Help needed moving from lying on your back to sitting on the side of a flat bed without using bedrails?:  None Help needed moving to and from a bed to a chair (including a wheelchair)?: A Little Help needed standing up from a chair using your arms (e.g., wheelchair or bedside chair)?: A Little Help needed to walk in hospital room?: A Little Help needed climbing 3-5 steps with a railing? : A Little 6 Click Score: 20    End of Session Equipment Utilized During Treatment: Gait belt Activity Tolerance: Patient tolerated treatment well;Patient limited by fatigue Patient left: in bed;with call bell/phone within reach;with family/visitor present Nurse Communication: Mobility status PT Visit Diagnosis: Other abnormalities of gait and mobility (R26.89);Muscle weakness (generalized) (M62.81)    Time: 3716-9678 PT Time Calculation (min) (ACUTE ONLY): 24 min   Charges:   PT Evaluation $PT Eval Moderate Complexity: 1 Mod PT Treatments $Therapeutic Exercise: 8-22 mins   Mabeline Caras, PT, DPT Acute Rehabilitation Services  Pager [phone] Office [place] 03/29/2020, 2:33 PM

## 2020-03-29 NOTE — Plan of Care (Signed)
  Problem: Education: Goal: Knowledge of General Education information will improve Description: Including pain rating scale, medication(s)/side effects and non-pharmacologic comfort measures Outcome: Progressing   Problem: Health Behavior/Discharge Planning: Goal: Ability to manage health-related needs will improve Outcome: Progressing   Problem: Clinical Measurements: Goal: Respiratory complications will improve Outcome: Progressing   Problem: Clinical Measurements: Goal: Cardiovascular complication will be avoided Outcome: Progressing   Problem: Pain Managment: Goal: General experience of comfort will improve Outcome: Progressing   Problem: Safety: Goal: Ability to remain free from injury will improve Outcome: Progressing   

## 2020-03-29 NOTE — Progress Notes (Signed)
PROGRESS NOTE  [name] MWN:[id number] DOB: [birth year] DOA: 03/26/2020 PCP: Elby Beck, FNP  Brief History   86 year old female with past medical history of permanent atrial fibrillation(on coumadin), chronic diastolic congestive heart failure(Echo 10/2018 EF 60-65%),nonischemic cardiomyopathy status post ICD placement, hypothyroidism, hypertension, COPD, peripheral artery disease, chronic kidney disease stage IIIb, iron deficiency anemia and ischemic colitis presented with generalized weakness, chest pain and black stools.  On presentation, hemoglobin was 6.2, INR was 4.7.  She was transfused packed red cells, vitamin K was given and GI was consulted.  Subsequently heart failure team and palliative care team were also consulted  An attempt was made to transfuse the patient with 1 unit of PRBC's on 03/28/2020. The patient complained of agitation, anxiety, tungue numbness, aching legs, and chest pain during the transfusion. Transfusion was stopped. 3 hours later the patient had a syncopal episode. Cardiology was consulted. CT head was performed and was negative. She is feeling better today.  GI plans on push enteroscopy once INR is less than 1.5. It is 2.1 today.  Consultants  . Palliative care . Heart failure team . Gastroenterology  Procedures  . Transfusion of 2 units PRBC's 05/28/2019.  . Transfusion of 1 units PRBC's on 05/29/2019 with possible reaction.  Antibiotics   Anti-infectives (From admission, onward)   None    .  Interval History/Subjective  The patient is resting comfortably in bed. She states that she is feeling better. Daughter is at bedside.  Objective   Vitals:  Vitals:   03/29/20 0547 03/29/20 1501  BP: (!) 104/59 (!) 120/51  Pulse: 64 62  Resp: 18 16  Temp: 98.2 F (36.8 C) 97.7 F (36.5 C)  SpO2: 94% 94%   Exam:  Constitutional:  . The patient is awake, alert, and oriented x 3. No acute distress. Respiratory:  . No increased work  of breathing. . No wheezes, rales, or rhonchi . No tactile fremitus Cardiovascular:  . Regular rate and rhythm . No murmurs, ectopy, or gallups. . No lateral PMI. No thrills. Abdomen:  . Abdomen is soft, non-tender, non-distended . No hernias, masses, or organomegaly . Normoactive bowel sounds.  Musculoskeletal:  . No cyanosis, clubbing, or edema Skin:  . No rashes, lesions, ulcers . palpation of skin: no induration or nodules Neurologic:  . CN 2-12 intact . Sensation all 4 extremities intact Psychiatric:  . Mental status o Mood, affect appropriate o Orientation to person, place, time  . judgment and insight appear intact  I have personally reviewed the following:   Today's Data  . Vitals, CBC, BMP  Imaging  . CT head: No acute intracranial abnormality. . CXR  Cardiology Data  . EKG with a competing junctional pacer . Echocardiogram  Scheduled Meds: . ezetimibe  10 mg Oral Daily  . levothyroxine  25 mcg Oral Q0600  . pantoprazole  40 mg Oral Q0600  . rosuvastatin  40 mg Oral Daily   Continuous Infusions:  Principal Problem:   Symptomatic anemia Active Problems:   Mixed hyperlipidemia   Permanent atrial fibrillation   COPD (chronic obstructive pulmonary disease) (HCC)   Chronic kidney disease, stage 3b (HCC)   Acute on chronic diastolic CHF (congestive heart failure) (HCC)   Supratherapeutic INR   Hypothyroidism   Palliative care by specialist   Goals of care, counseling/discussion   GI bleed   LOS: 1 day    A & P  Acute on chronic blood loss anemia: Melena with dizziness on admission. The patient was  transfused with 2 units PRBC's on 03/27/2020 and another on 03/28/2020. The patient had complaints of agitation, anxiety, leg pain, and chest pain with the last unit. Post Reaction DAT IgGwas negative. CAT C3 was negative Pathology interpretation is pending, but is unlikely to be positive. Will check anemia panel.  GI bleed: Gastroenterology following.  Plan is for push enteroscopy when INR is below 1.5.  Supertherapeutic INR: Was 4.7 on admission. 2.1 this morning. Push enteroscopy will be done when INR is less than 1.5.  Syncope: Brief. Took place a couple of hours after reaction. Cr. Hochrein of cardiology responded. No arrhythmias noted on telemetry. Possibly due to orthostasis given circumstances.  Atrial fibrillation: Permanent. The patient's rate is controlled on no rate controlling medications. Anticoagulation is held due to melena and symptomatic anemia. Continue telemetry.  Hypothyroidism: Continue Synthroid as at home.  Hyperlipidemia: Continue statin.  Debility: Recommendations from PT/OT is home with [facility] PT/OT.  COPD: Noted and stable.   CKD IIIb: Creatinine is at baseline.  I have seen and examined this patient myself. I have spent 34 minutes in her evaluation and care.  DVT Prophylaxis: SCD's CODE STATUS; Full Code Family Communication: Discussed patient in detail with daughter who is at bedside Disposition:  Status is: Inpatient  Remains inpatient appropriate because:Ongoing diagnostic testing needed not appropriate for outpatient work up   Dispo: The patient is from: Home              Anticipated d/c is to: Home with home health PT/OT              Anticipated d/c date is: 2 days              Patient currently is not medically stable to d/c.  Romell Wolden, DO [facility] Direct contact: see [email or web address]  7PM-7AM contact night coverage as above 03/29/2020, 6:17 PM  LOS: 1 day

## 2020-03-29 NOTE — Progress Notes (Signed)
EEG complete - results pending 

## 2020-03-30 ENCOUNTER — Encounter (HOSPITAL_COMMUNITY)
Admission: EM | Disposition: A | Discharge status: Home Health | Payer: Self-pay | Source: Home / Self Care | Attending: Internal Medicine

## 2020-03-30 ENCOUNTER — Inpatient Hospital Stay (HOSPITAL_COMMUNITY): Payer: PPO | Admitting: Certified Registered Nurse Anesthetist

## 2020-03-30 ENCOUNTER — Encounter (HOSPITAL_COMMUNITY): Payer: Self-pay | Admitting: Internal Medicine

## 2020-03-30 DIAGNOSIS — R404 Transient alteration of awareness: Secondary | ICD-10-CM | POA: Diagnosis not present

## 2020-03-30 DIAGNOSIS — I5033 Acute on chronic diastolic (congestive) heart failure: Secondary | ICD-10-CM | POA: Diagnosis not present

## 2020-03-30 DIAGNOSIS — K5521 Angiodysplasia of colon with hemorrhage: Secondary | ICD-10-CM

## 2020-03-30 DIAGNOSIS — K317 Polyp of stomach and duodenum: Secondary | ICD-10-CM | POA: Diagnosis not present

## 2020-03-30 DIAGNOSIS — K5520 Angiodysplasia of colon without hemorrhage: Secondary | ICD-10-CM

## 2020-03-30 HISTORY — PX: HOT HEMOSTASIS: SHX5433

## 2020-03-30 HISTORY — PX: ENTEROSCOPY: SHX5533

## 2020-03-30 LAB — BASIC METABOLIC PANEL
Anion gap: 8 (ref 5–15)
BUN: 60 mg/dL — ABNORMAL HIGH (ref 8–23)
CO2: 28 mmol/L (ref 22–32)
Calcium: 8.7 mg/dL — ABNORMAL LOW (ref 8.9–10.3)
Chloride: 98 mmol/L (ref 98–111)
Creatinine, Ser: 1.71 mg/dL — ABNORMAL HIGH (ref 0.44–1.00)
GFR, Estimated: 29 mL/min — ABNORMAL LOW (ref >60)
Glucose, Bld: 107 mg/dL — ABNORMAL HIGH (ref 70–99)
Potassium: 4.3 mmol/L (ref 3.5–5.1)
Sodium: 134 mmol/L — ABNORMAL LOW (ref 135–145)

## 2020-03-30 LAB — CBC
HCT: 24.7 % — ABNORMAL LOW (ref 36.0–46.0)
Hemoglobin: 7.3 g/dL — ABNORMAL LOW (ref 12.0–15.0)
MCH: 26.4 pg (ref 26.0–34.0)
MCHC: 29.6 g/dL — ABNORMAL LOW (ref 30.0–36.0)
MCV: 89.5 fL (ref 80.0–100.0)
Platelets: 214 10*3/uL (ref 150–400)
RBC: 2.76 MIL/uL — ABNORMAL LOW (ref 3.87–5.11)
RDW: 18.7 % — ABNORMAL HIGH (ref 11.5–15.5)
WBC: 5.6 10*3/uL (ref 4.0–10.5)
nRBC: 1.2 % — ABNORMAL HIGH (ref 0.0–0.2)

## 2020-03-30 LAB — PROTIME-INR
INR: 1.4 — ABNORMAL HIGH (ref 0.8–1.2)
Prothrombin Time: 16.4 seconds — ABNORMAL HIGH (ref 11.4–15.2)

## 2020-03-30 SURGERY — ENTEROSCOPY
Anesthesia: Monitor Anesthesia Care

## 2020-03-30 MED ORDER — PROPOFOL 10 MG/ML IV BOLUS
INTRAVENOUS | Status: DC | PRN
  Administered 2020-03-30: 20 mg via INTRAVENOUS

## 2020-03-30 MED ORDER — PROPOFOL 500 MG/50ML IV EMUL
INTRAVENOUS | Status: DC | PRN
  Administered 2020-03-30: 100 ug/kg/min via INTRAVENOUS

## 2020-03-30 MED ORDER — LACTATED RINGERS IV SOLN: INTRAVENOUS | Status: DC | PRN

## 2020-03-30 NOTE — Plan of Care (Signed)
  Problem: Education: Goal: Knowledge of General Education information will improve Description: Including pain rating scale, medication(s)/side effects and non-pharmacologic comfort measures Outcome: Progressing   Problem: Clinical Measurements: Goal: Diagnostic test results will improve Outcome: Progressing   Problem: Clinical Measurements: Goal: Respiratory complications will improve Outcome: Progressing   Problem: Clinical Measurements: Goal: Cardiovascular complication will be avoided Outcome: Progressing   Problem: Activity: Goal: Risk for activity intolerance will decrease Outcome: Progressing

## 2020-03-30 NOTE — Anesthesia Preprocedure Evaluation (Addendum)
Anesthesia Evaluation  Patient identified by MRN, date of birth, ID band Patient awake    Reviewed: Allergy & Precautions, NPO status , Patient's Chart, lab work & pertinent test results  History of Anesthesia Complications (+) PONV and history of anesthetic complications  Airway Mallampati: III  TM Distance: >3 FB Neck ROM: Full    Dental  (+) Dental Advisory Given   Pulmonary shortness of breath, COPD, former smoker,    breath sounds clear to auscultation       Cardiovascular hypertension, + CAD, + Peripheral Vascular Disease and +CHF  + Cardiac Defibrillator  Rhythm:Regular  1. Left ventricular ejection fraction, by estimation, is 65 to 70%. The  left ventricle has normal function. The left ventricle has no regional  wall motion abnormalities. There is moderate left ventricular hypertrophy.  Left ventricular diastolic  parameters are indeterminate. There is the interventricular septum is  flattened in systole and diastole, consistent with right ventricular  pressure and volume overload.  2. Right ventricular systolic function is mildly reduced. The right  ventricular size is normal. A n AICD wire is visualized. There is severely  elevated pulmonary artery systolic pressure. The estimated right  ventricular systolic pressure is 74.9 mmHg.  3. Left atrial size was mildly dilated.  4. Right atrial size was severely dilated.  5. The mitral valve is abnormal. Mild to moderate mitral valve  regurgitation.  6. The tricuspid valve is abnormal. Tricuspid valve regurgitation is  severe.  7. Valve area measures 0.98 cm2 by planimetry. The aortic valve is  calcified. Aortic valve regurgitation is mild. Moderate to severe aortic  valve stenosis. Aortic valve area, by VTI measures 0.95 cm. Aortic valve  mean gradient measures 13.0 mmHg.  Aortic valve Vmax measures 2.42 m/s.  8. The inferior vena cava is dilated in size with  <50% respiratory  variability, suggesting right atrial pressure of 15 mmHg.    Neuro/Psych PSYCHIATRIC DISORDERS TIA   GI/Hepatic Neg liver ROS, ? GI bleed    Endo/Other  Hypothyroidism   Renal/GU CRFRenal diseaseLab Results      Component                Value               Date                      CREATININE               1.71 (H)            03/30/2020           Lab Results      Component                Value               Date                      K                        4.3                 03/30/2020                Musculoskeletal   Abdominal   Peds  Hematology  (+) Blood dyscrasia, anemia , Lab Results      Component  Value               Date                      WBC                      5.6                 03/30/2020                HGB                      7.3 (L)             03/30/2020                HCT                      24.7 (L)            03/30/2020                MCV                      89.5                03/30/2020                PLT                      214                 03/30/2020              Anesthesia Other Findings   Reproductive/Obstetrics                            Anesthesia Physical Anesthesia Plan  ASA: III  Anesthesia Plan: MAC   Post-op Pain Management:    Induction: Intravenous  PONV Risk Score and Plan: 3 and Propofol infusion  Airway Management Planned: Nasal Cannula  Additional Equipment: None  Intra-op Plan:   Post-operative Plan:   Informed Consent: I have reviewed the patients History and Physical, chart, labs and discussed the procedure including the risks, benefits and alternatives for the proposed anesthesia with the patient or authorized representative who has indicated his/her understanding and acceptance.     Dental advisory given  Plan Discussed with: CRNA and Surgeon  Anesthesia Plan Comments:         Anesthesia Quick Evaluation

## 2020-03-30 NOTE — Progress Notes (Signed)
PROGRESS NOTE  [name] OZH:[id number] DOB: 24-Feb-1934 DOA: 03/26/2020 PCP: [name] Beck, FNP  Brief History   86 year old female with past medical history of permanent atrial fibrillation(on coumadin), chronic diastolic congestive heart failure(Echo 10/2018 EF 60-65%),nonischemic cardiomyopathy status post ICD placement, hypothyroidism, hypertension, COPD, peripheral artery disease, chronic kidney disease stage IIIb, iron deficiency anemia and ischemic colitis presented with generalized weakness, chest pain and black stools.  On presentation, hemoglobin was 6.2, INR was 4.7.  She was transfused packed red cells, vitamin K was given and GI was consulted.  Subsequently heart failure team and palliative care team were also consulted  An attempt was made to transfuse the patient with 1 unit of PRBC's on 03/28/2020. The patient complained of agitation, anxiety, tungue numbness, aching legs, and chest pain during the transfusion. Transfusion was stopped. 3 hours later the patient had a syncopal episode. Cardiology was consulted. CT head was performed and was negative. She is feeling better today.  GI performed push enteroscopy on 03/30/2020 as INR was less than 1.5. This study demonstrated 1 bleeding AVM in the small bowel as well as 2 non-bleeding AVM's in the jejunum. Her diet will be advanced today. She will be able to start systemic anticoagulatino with eliquis in the am as recommended by cardiology. Small bowel enteroscopy will need to be repeated for retreatment as necessary.   Consultants  . Palliative care . Heart failure team . Gastroenterology  Procedures  . Transfusion of 2 units PRBC's 05/28/2019.  . Transfusion of 1 units PRBC's on 05/29/2019 with possible reaction.  Antibiotics   Anti-infectives (From admission, onward)   None     Interval History/Subjective  The patient is resting comfortably in bed. She states that she is feeling better. Daughter is at  bedside.  Objective   Vitals:  Vitals:   03/30/20 0917 03/30/20 0927  BP: (!) 106/33 (!) 128/59  Pulse: 60 61  Resp: (!) 21 (!) 22  Temp:    SpO2: 100% 96%   Exam:  Constitutional:  . The patient is awake, alert, and oriented x 3. No acute distress. Respiratory:  . No increased work of breathing. . No wheezes, rales, or rhonchi . No tactile fremitus Cardiovascular:  . Regular rate and rhythm . No murmurs, ectopy, or gallups. . No lateral PMI. No thrills. Abdomen:  . Abdomen is soft, non-tender, non-distended . No hernias, masses, or organomegaly . Normoactive bowel sounds.  Musculoskeletal:  . No cyanosis, clubbing, or edema Skin:  . No rashes, lesions, ulcers . palpation of skin: no induration or nodules Neurologic:  . CN 2-12 intact . Sensation all 4 extremities intact Psychiatric:  . Mental status o Mood, affect appropriate o Orientation to person, place, time  . judgment and insight appear intact  I have personally reviewed the following:   Today's Data  . Vitals, CBC, BMP  Imaging  . CT head: No acute intracranial abnormality. . CXR  Cardiology Data  . EKG with a competing junctional pacer . Echocardiogram  Scheduled Meds: . ezetimibe  10 mg Oral Daily  . levothyroxine  25 mcg Oral Q0600  . pantoprazole  40 mg Oral Q0600  . rosuvastatin  40 mg Oral Daily   Continuous Infusions:  Principal Problem:   Symptomatic anemia Active Problems:   Mixed hyperlipidemia   Permanent atrial fibrillation   COPD (chronic obstructive pulmonary disease) (HCC)   Chronic kidney disease, stage 3b (HCC)   Acute on chronic diastolic CHF (congestive heart failure) ([place])  Supratherapeutic INR   Hypothyroidism   Palliative care by specialist   Goals of care, counseling/discussion   GI bleed   AVM (arteriovenous malformation) of small bowel, acquired with hemorrhage   Gastric polyps   LOS: 2 days    A & P  Acute on chronic blood loss anemia: Melena with  dizziness on admission. The patient was transfused with 2 units PRBC's on 03/27/2020 and another on 03/28/2020. The patient had complaints of agitation, anxiety, leg pain, and chest pain with the last unit. Post Reaction DAT IgGwas negative. CAT C3 was negative Pathology interpretation is pending, but is unlikely to be positive. Anemia panel has demonstrated borderline low saturation ratio's, adequate, but low normal iron stores, with adequate UIBC and TIBC.  GI bleed: GI performed push enteroscopy on 03/30/2020 as INR was less than 1.5. This study demonstrated 1 bleeding AVM in the small bowel as well as 2 non-bleeding AVM's in the jejunum. Her diet will be advanced today. She will be able to start systemic anticoagulatino with eliquis in the am as recommended by cardiology. Small bowel enteroscopy will need to be repeated for retreatment as necessary.   Supertherapeutic INR: Was 4.7 on admission. 1.4 this morning. Plan to start eliquis in the am.  Syncope: Brief. Took place a couple of hours after reaction. Cr. Hochrein of cardiology responded. No arrhythmias noted on telemetry. Possibly due to orthostasis given circumstances.  Atrial fibrillation: Permanent. The patient's rate is controlled on no rate controlling medications. Anticoagulation is held due to melena and symptomatic anemia. Continue telemetry.  Hypothyroidism: Continue Synthroid as at home.  Hyperlipidemia: Continue statin.  Debility: Recommendations from PT/OT is home with [name] PT/OT.  COPD: Noted and stable.   CKD IIIb: Creatinine is at baseline.  I have seen and examined this patient myself. I have spent 40 minutes in her evaluation and care. More than 50% of this was spent in counseling with the patient and her daughter who was at bedside. All questions answered to the best of my ability.  DVT Prophylaxis: SCD's CODE STATUS; Full Code Family Communication: Discussed patient in detail with daughter who is at  bedside Disposition:  Status is: Inpatient  Remains inpatient appropriate because:Ongoing diagnostic testing needed not appropriate for outpatient work up   Dispo: The patient is from: Home              Anticipated d/c is to: Home with home health PT/OT              Anticipated d/c date is: 2 days              Patient currently is not medically stable to d/c.  Rhian Funari, DO [facility] Direct contact: see [email or web address]  7PM-7AM contact night coverage as above 03/30/2020, 2:35 PM  LOS: 1 day

## 2020-03-30 NOTE — Procedures (Signed)
Patient Name: [name]  MRN: [id number]  Epilepsy Attending: Lora Havens  Referring Physician/Provider: Dr Donnetta Simpers Date: 03/29/2020 Duration: 24.11 minutes  Patient history: 86 year old female with syncopal episode with a prodrome of behavioral arrest.  EEG to evaluate for seizures.  Level of alertness: Awake, drowsy  AEDs during EEG study: None  Technical aspects: This EEG study was done with scalp electrodes positioned according to the 10-20 International system of electrode placement. Electrical activity was acquired at a sampling rate of 500Hz  and reviewed with a high frequency filter of 70Hz  and a low frequency filter of 1Hz . EEG data were recorded continuously and digitally stored.   Description: The posterior dominant rhythm consists of 9-10 Hz activity of moderate voltage (25-35 uV) seen predominantly in posterior head regions, symmetric and reactive to eye opening and eye closing. Drowsiness was characterized by attenuation of the posterior background rhythm.  Hyperventilation and photic stimulation were not performed.     IMPRESSION: This study is within normal limits. No seizures or epileptiform discharges were seen throughout the recording.  Emma Birchler Barbra Sarks

## 2020-03-30 NOTE — Plan of Care (Signed)

## 2020-03-30 NOTE — Progress Notes (Signed)
Brief Neuro Update:  Briefly [name] is an 86 year old female who presents with syncopal episode.  She had initial prodrome of behavioral arrest which recent suspicion for potential absense seizure but will be atypical for this to occur in her age group.  She cannot get an MRI due to ICD placement.  Work-up with a CT head without contrast was negative for a large territory stroke or an ICH.  A routine EEG was completed and was negative for any epileptiform discharges.   Recommendations: -No further inpatient neurological work-up recommended at this time.  Neurology inpatient team will sign off please feel free to contact us with any questions or concerns.  [place] Pager Number [phone]

## 2020-03-30 NOTE — Transfer of Care (Signed)
Immediate Anesthesia Transfer of Care Note  Patient: [name]  Procedure(s) Performed: ENTEROSCOPY (N/A ) HOT HEMOSTASIS (ARGON PLASMA COAGULATION/BICAP) (N/A )  Patient Location: Endoscopy Unit  Anesthesia Type:MAC  Level of Consciousness: awake, alert  and oriented  Airway & Oxygen Therapy: Patient Spontanous Breathing and Patient connected to nasal cannula oxygen  Post-op Assessment: Report given to RN and Post -op Vital signs reviewed and stable  Post vital signs: Reviewed and stable  Last Vitals:  Vitals Value Taken Time  BP 87/40 03/30/20 0907  Temp    Pulse 60 03/30/20 0909  Resp 21 03/30/20 0909  SpO2 100 % 03/30/20 0909  Vitals shown include unvalidated device data.  Last Pain:  Vitals:   03/30/20 0758  TempSrc: Oral  PainSc: 0-No pain         Complications: No complications documented.

## 2020-03-30 NOTE — Progress Notes (Signed)
Pt back from endoscopy requesting to eat. MD paged and orders for full liquids received. Pt tolerated grits and jello. Drank 236ml. No nausea or vomiting. Pt again requesting to advance diet. MD made aware. Dies advanced to soft.

## 2020-03-30 NOTE — Interval H&P Note (Signed)
History and Physical Interval Note:  03/30/2020 8:15 AM  [name]  has presented today for surgery, with the diagnosis of anemia.  FOBT + stool.  The various methods of treatment have been discussed with the patient and family. After consideration of risks, benefits and other options for treatment, the patient has consented to  Procedure(s): ENTEROSCOPY (N/A) as a surgical intervention.  The patient's history has been reviewed, patient examined, no change in status, stable for surgery.  I have reviewed the patient's chart and labs.  Questions were answered to the patient's satisfaction.     Dominic Pea Nyasiah Moffet

## 2020-03-30 NOTE — Progress Notes (Signed)
Progress Note  Patient Name: [name] Date of Encounter: 03/30/2020  [facility] Cardiologist: Virl Axe, MD / [name]  Subjective   Pt examined in endocsopy, prior to EGD  No dyspnea.  Admitted with supratherapeutic INR and a GI bleed.  Inpatient Medications    Scheduled Meds: . [MAR Hold] ezetimibe  10 mg Oral Daily  . [MAR Hold] levothyroxine  25 mcg Oral Q0600  . [MAR Hold] pantoprazole  40 mg Oral Q0600  . [MAR Hold] rosuvastatin  40 mg Oral Daily   Continuous Infusions:  PRN Meds: [MAR Hold] acetaminophen **OR** [MAR Hold] acetaminophen, [MAR Hold] ondansetron **OR** [MAR Hold] ondansetron (ZOFRAN) IV, [MAR Hold] polyethylene glycol   Vital Signs    Vitals:   03/29/20 1501 03/29/20 2141 03/30/20 0603 03/30/20 0758  BP: (!) 120/51 (!) 94/56 118/65 (!) 135/31  Pulse: 62 65 68 61  Resp: 16 19 18  (!) 21  Temp: 97.7 F (36.5 C) 98.2 F (36.8 C) 98 F (36.7 C) 97.9 F (36.6 C)  TempSrc:  Oral Oral Oral  SpO2: 94% 91% 92% 96%  Weight:      Height:        Intake/Output Summary (Last 24 hours) at 03/30/2020 0843 Last data filed at 03/29/2020 1300 Gross per 24 hour  Intake 120 ml  Output 1 ml  Net 119 ml   Last 3 Weights 03/29/2020 03/26/2020 03/23/2020  Weight (lbs) 151 lb 3.8 oz 145 lb 145 lb  Weight (kg) 68.6 kg 65.772 kg 65.772 kg      Telemetry    Paced rhythm.- Personally Reviewed  ECG     - Personally Reviewed  Physical Exam   GEN:   Elderly female, no acute distress Neck: No JVD Cardiac: RRR,  + systolic murmur   Respiratory: Clear to auscultation bilaterally. GI: Soft, nontender, non-distended  MS: No edema; No deformity. Neuro:  Nonfocal  Psych: Normal affect   Labs    High Sensitivity Troponin:   Recent Labs  Lab 03/27/20 0001 03/27/20 0204 03/28/20 1343  TROPONINIHS 28* 26* 49*      Chemistry Recent Labs  Lab 03/27/20 0001 03/27/20 0001 03/27/20 1555 03/27/20 1555 03/28/20 0245 03/29/20 0214  03/30/20 0217  NA 135   < > 135   < > 135 134* 134*  K 4.6   < > 3.8   < > 3.9 4.2 4.3  CL 98   < > 95*   < > 99 96* 98  CO2 26   < > 29   < > 26 25 28   GLUCOSE 108*   < > 118*   < > 101* 101* 107*  BUN 54*   < > 55*   < > 58* 62* 60*  CREATININE 1.64*   < > 1.89*   < > 1.81* 1.92* 1.71*  CALCIUM 8.8*   < > 8.2*   < > 8.3* 8.2* 8.7*  PROT 5.8*  --  5.3*  --   --   --   --   ALBUMIN 3.5  --  3.0*  --   --   --   --   AST 17  --  21  --   --   --   --   ALT 12  --  11  --   --   --   --   ALKPHOS 40  --  35*  --   --   --   --   BILITOT 0.9  --  1.7*  --   --   --   --   GFRNONAA 31*   < > 26*   < > 27* 25* 29*  ANIONGAP 11   < > 11   < > 10 13 8    < > = values in this interval not displayed.     Hematology Recent Labs  Lab 03/28/20 1343 03/28/20 1343 03/29/20 0732 03/29/20 1902 03/30/20 0217  WBC 5.9  --  4.8  --  5.6  RBC 2.86*   < > 2.76* 2.72* 2.76*  HGB 7.9*  --  7.4*  --  7.3*  HCT 25.4*  --  24.3*  --  24.7*  MCV 88.8  --  88.0  --  89.5  MCH 27.6  --  26.8  --  26.4  MCHC 31.1  --  30.5  --  29.6*  RDW 18.2*  --  18.6*  --  18.7*  PLT 204  --  196  --  214   < > = values in this interval not displayed.    BNP Recent Labs  Lab 03/27/20 0001  BNP 252.8*     DDimer No results for input(s): DDIMER in the last 168 hours.   Radiology    EEG  Result Date: 03/30/2020 Lora Havens, MD     03/30/2020  8:39 AM Patient Name: [name] MRN: [id number] Epilepsy Attending: Lora Havens Referring Physician/Provider: Dr Donnetta Simpers Date: 03/29/2020 Duration: 24.11 minutes Patient history: 86 year old female with syncopal episode with a prodrome of behavioral arrest.  EEG to evaluate for seizures. Level of alertness: Awake, drowsy AEDs during EEG study: None Technical aspects: This EEG study was done with scalp electrodes positioned according to the 10-20 International system of electrode placement. Electrical activity was acquired at a sampling rate  of 500Hz  and reviewed with a high frequency filter of 70Hz  and a low frequency filter of 1Hz . EEG data were recorded continuously and digitally stored. Description: The posterior dominant rhythm consists of 9-10 Hz activity of moderate voltage (25-35 uV) seen predominantly in posterior head regions, symmetric and reactive to eye opening and eye closing. Drowsiness was characterized by attenuation of the posterior background rhythm.  Hyperventilation and photic stimulation were not performed.   IMPRESSION: This study is within normal limits. No seizures or epileptiform discharges were seen throughout the recording. Lora Havens   CT HEAD WO CONTRAST  Result Date: 03/28/2020 CLINICAL DATA:  Syncopal episode EXAM: CT HEAD WITHOUT CONTRAST TECHNIQUE: Contiguous axial images were obtained from the base of the skull through the vertex without intravenous contrast. COMPARISON:  February 24, 2019 FINDINGS: Brain: There is no acute intracranial hemorrhage, mass effect, or edema. Gray-white differentiation is preserved. There is no extra-axial fluid collection. Ventricles and sulci are within normal limits in size and configuration. Patchy hypoattenuation in the supratentorial white matter is nonspecific but may reflect mild chronic microvascular ischemic changes similar to the prior study. Vascular: There is atherosclerotic calcification at the skull base. Skull: Calvarium is unremarkable. Sinuses/Orbits: No acute finding. Other: None. IMPRESSION: No acute intracranial abnormality. Electronically Signed   By: Macy Mis M.D.   On: 03/28/2020 20:27    Cardiac Studies     Patient Profile     86 y.o. female with history of congestive heart failure and pulmonary hypertension.  She was admitted with significant anemia and supratherapeutic INR of 4.7.  Assessment & Plan    1.  Anemia: Patient was admitted with an INR  level of 4.7.  She is received vitamin K.  She is received transfusion.  She is  undergoing EGD today.  2.   Pulmonary HTN:   Has mild RV dysfunction with evidence of volume and pressure overload  Further plans per CHF team .          For questions or updates, please contact [place] Please consult [email or web address] for contact info under        Signed, Mertie Moores, MD  03/30/2020, 8:43 AM

## 2020-03-30 NOTE — Op Note (Signed)
[facility] Patient Name: [name] Procedure Date : 03/30/2020 MRN: [id number] Attending MD: Gerrit Heck , MD Date of Birth: [birth year] CSN: [id number] Age: 86 Admit Type: Inpatient Procedure:                Small bowel enteroscopy Indications:              Acute post hemorrhagic anemia, Occult blood in stool Providers:                Gerrit Heck, MD, Vista Lawman, RN, Laverda Sorenson, Technician, Benetta Spar, Technician,                            Clearnce Sorrel, CRNA Referring MD:              Medicines:                Monitored Anesthesia Care Complications:            No immediate complications. Estimated Blood Loss:     Estimated blood loss was minimal. Procedure:                Pre-Anesthesia Assessment:                           - Prior to the procedure, a History and Physical                            was performed, and patient medications and                            allergies were reviewed. The patient's tolerance of                            previous anesthesia was also reviewed. The risks                            and benefits of the procedure and the sedation                            options and risks were discussed with the patient.                            All questions were answered, and informed consent                            was obtained. Prior Anticoagulants: The patient has                            taken Coumadin (warfarin), last dose was 5 days                            prior to procedure. ASA Grade Assessment: III - A  patient with severe systemic disease. After                            reviewing the risks and benefits, the patient was                            deemed in satisfactory condition to undergo the                            procedure.                           After obtaining informed consent, the endoscope was                            passed under  direct vision. Throughout the                            procedure, the patient's blood pressure, pulse, and                            oxygen saturations were monitored continuously. The                            PCF-H190DL ([id number]) Olympus pediatric colonoscope                            was introduced through the mouth and advanced to                            the proximal jejunum. The small bowel enteroscopy                            was accomplished without difficulty. The patient                            tolerated the procedure well. Scope In: Scope Out: Findings:      The examined esophagus was normal.      A few small sessile polyps with no bleeding were found in the gastric       fundus and in the gastric body.      The mucosa was otherwise normal throughout the stomach.      A single angioectasia with no bleeding was found in the third portion of       the duodenum. Coagulation for hemostasis using argon plasma was       successful. Estimated blood loss: none.      Two angioectasias were found in the proximal jejunum. One was actively       bleeding. Coagulation for hemostasis using argon plasma was successful.       Estimated blood loss was minimal. The remainder of the visualized small       bowel mucosa was normal appearing. Impression:               - Normal esophagus.                           -  A few gastric polyps.                           - Normal mucosa was found in the entire stomach.                           - A single non-bleeding angioectasia in the                            duodenum. Treated with argon plasma coagulation                            (APC).                           - Two angioectasias in the jejunum, one which was                            actively bleeding. Treated with argon plasma                            coagulation (APC).                           - No specimens collected. Recommendation:           - Return patient to hospital  ward for ongoing care.                           - Full liquid diet now, then advance as tolerated                            today.                           - Continue present medications.                           - Ok to resume systemic anticoagulation tomorrow.                            Plan for Eliquis per Cardiology recommendations.                           - Repeat the small bowel enteroscopy PRN for                            retreatment.                           - Please do not hesitate to contact the GI service                            with any additional questions or concerns. Procedure Code(s):        --- Professional ---  [phone], Small intestinal endoscopy, enteroscopy                            beyond second portion of duodenum, not including                            ileum; with control of bleeding (eg, injection,                            bipolar cautery, unipolar cautery, laser, heater                            probe, stapler, plasma coagulator) Diagnosis Code(s):        --- Professional ---                           K31.7, Polyp of stomach and duodenum                           K31.819, Angiodysplasia of stomach and duodenum                            without bleeding                           K55.21, Angiodysplasia of colon with hemorrhage                           D62, Acute posthemorrhagic anemia                           R19.5, Other fecal abnormalities CPT copyright 2019 American Medical Association. All rights reserved. The codes documented in this report are preliminary and upon coder review may  be revised to meet current compliance requirements. Gerrit Heck, MD 03/30/2020 9:18:10 AM Number of Addenda: 0

## 2020-03-31 ENCOUNTER — Telehealth: Payer: Self-pay | Admitting: Nurse Practitioner

## 2020-03-31 ENCOUNTER — Other Ambulatory Visit (HOSPITAL_COMMUNITY): Payer: Self-pay | Admitting: Internal Medicine

## 2020-03-31 DIAGNOSIS — K5521 Angiodysplasia of colon with hemorrhage: Secondary | ICD-10-CM | POA: Diagnosis not present

## 2020-03-31 DIAGNOSIS — D649 Anemia, unspecified: Secondary | ICD-10-CM | POA: Diagnosis not present

## 2020-03-31 LAB — BASIC METABOLIC PANEL
Anion gap: 13 (ref 5–15)
BUN: 48 mg/dL — ABNORMAL HIGH (ref 8–23)
CO2: 25 mmol/L (ref 22–32)
Calcium: 8.8 mg/dL — ABNORMAL LOW (ref 8.9–10.3)
Chloride: 97 mmol/L — ABNORMAL LOW (ref 98–111)
Creatinine, Ser: 1.73 mg/dL — ABNORMAL HIGH (ref 0.44–1.00)
GFR, Estimated: 29 mL/min — ABNORMAL LOW (ref >60)
Glucose, Bld: 109 mg/dL — ABNORMAL HIGH (ref 70–99)
Potassium: 4 mmol/L (ref 3.5–5.1)
Sodium: 135 mmol/L (ref 135–145)

## 2020-03-31 LAB — CBC
HCT: 25.8 % — ABNORMAL LOW (ref 36.0–46.0)
Hemoglobin: 7.6 g/dL — ABNORMAL LOW (ref 12.0–15.0)
MCH: 26.9 pg (ref 26.0–34.0)
MCHC: 29.5 g/dL — ABNORMAL LOW (ref 30.0–36.0)
MCV: 91.2 fL (ref 80.0–100.0)
Platelets: 241 10*3/uL (ref 150–400)
RBC: 2.83 MIL/uL — ABNORMAL LOW (ref 3.87–5.11)
RDW: 18.5 % — ABNORMAL HIGH (ref 11.5–15.5)
WBC: 6 10*3/uL (ref 4.0–10.5)
nRBC: 0.8 % — ABNORMAL HIGH (ref 0.0–0.2)

## 2020-03-31 LAB — PROTIME-INR
INR: 1.4 — ABNORMAL HIGH (ref 0.8–1.2)
Prothrombin Time: 16.3 seconds — ABNORMAL HIGH (ref 11.4–15.2)

## 2020-03-31 MED ORDER — APIXABAN 2.5 MG PO TABS: 2.5000 mg | ORAL_TABLET | Freq: Two times a day (BID) | ORAL | 0 refills | Status: DC

## 2020-03-31 MED ORDER — APIXABAN 2.5 MG PO TABS
2.5000 mg | ORAL_TABLET | Freq: Two times a day (BID) | ORAL | Status: DC
  Administered 2020-03-31: 2.5 mg via ORAL
  Filled 2020-03-31: qty 1

## 2020-03-31 MED ORDER — APIXABAN 5 MG PO TABS: 5.0000 mg | ORAL_TABLET | Freq: Two times a day (BID) | ORAL | Status: DC

## 2020-03-31 MED ORDER — PANTOPRAZOLE SODIUM 40 MG PO TBEC
40.0000 mg | DELAYED_RELEASE_TABLET | Freq: Every day | ORAL | 0 refills | Status: DC
Start: 2020-04-01 — End: 2020-04-01

## 2020-03-31 MED FILL — PANTOPRAZOLE SOD DR 40 MG T: 40 | 30 days supply | Qty: 30 | Fill #0

## 2020-03-31 MED FILL — ELIQUIS 2.5 MG TABLET: 2.5 | 30 days supply | Qty: 60 | Fill #0

## 2020-03-31 NOTE — Telephone Encounter (Signed)
Beth, pls contact the patient and provide her with a lab order and instructions to have a CBC done in one week (post hospital discharge). thx

## 2020-03-31 NOTE — Progress Notes (Addendum)
Advanced Heart Failure Rounding Note  PCP-Cardiologist: Virl Axe, MD  [facility]: Dr. [name]   Subjective:    Small bowel endoscopy completed yesterday - Normal esophagus. - A few gastric polyps. - Normal mucosa was found in the entire stomach. - A single non-bleeding angioectasia in the duodenum. Treated with argon plasma coagulation (APC). - Two angioectasias in the jejunum, one which was actively bleeding. Treated with argon plasma coagulation (APC). - No specimens collected.  Per GI, ok to resume anticoagulation today. Hgb is stable, 7.6 today.   EEG completed. No seizures or epileptiform discharges were seen throughout the recording.   Sitting up in bed. Feels tired. No other complaints.    Objective:   Weight Range: 68.6 kg Body mass index is 24.41 kg/m.   Vital Signs:   Temp:  [97.8 F (36.6 C)-98.6 F (37 C)] 97.8 F (36.6 C) (11/26 0543) Pulse Rate:  [61-66] 61 (11/26 0543) Resp:  [16-20] 16 (11/26 0543) BP: (103-121)/(46-65) 111/46 (11/26 0543) SpO2:  [90 %-98 %] 94 % (11/26 0543) Last BM Date: 03/28/20  Weight change: Filed Weights   03/26/20 2342 03/29/20 1300  Weight: 65.8 kg 68.6 kg    Intake/Output:   Intake/Output Summary (Last 24 hours) at 03/31/2020 0929 Last data filed at 03/31/2020 0128 Gross per 24 hour  Intake 540 ml  Output --  Net 540 ml      Physical Exam    PHYSICAL EXAM: General:  Elderly fatigue appearing WF sitting up on side of bed. No respiratory difficulty HEENT: normal Neck: supple. Neck veins distended (severe TR). Carotids 2+ bilat; no bruits. No lymphadenopathy or thyromegaly appreciated. Cor: PMI nondisplaced. Irregular rate & rhythm.2/6 AS murmur RUSB Lungs: clear Abdomen: soft, nontender, nondistended. No hepatosplenomegaly. No bruits or masses. Good bowel sounds. Extremities: no cyanosis, clubbing, rash, edema Neuro: alert & oriented x 3, cranial nerves grossly intact. moves all 4 extremities w/o  difficulty. Affect pleasant.   Telemetry   Afib, paced intermittently rates 80s.  Personally reviewed.    EKG    No new EKG to review   Labs    CBC Recent Labs    03/30/20 0217 03/31/20 0057  WBC 5.6 6.0  HGB 7.3* 7.6*  HCT 24.7* 25.8*  MCV 89.5 91.2  PLT 214 703   Basic Metabolic Panel Recent Labs    03/30/20 0217 03/31/20 0057  NA 134* 135  K 4.3 4.0  CL 98 97*  CO2 28 25  GLUCOSE 107* 109*  BUN 60* 48*  CREATININE 1.71* 1.73*  CALCIUM 8.7* 8.8*   Liver Function Tests No results for input(s): AST, ALT, ALKPHOS, BILITOT, PROT, ALBUMIN in the last 72 hours. No results for input(s): LIPASE, AMYLASE in the last 72 hours. Cardiac Enzymes No results for input(s): CKTOTAL, CKMB, CKMBINDEX, TROPONINI in the last 72 hours.  BNP: BNP (last 3 results) Recent Labs    06/03/19 1508 03/27/20 0001  BNP 386.1* 252.8*    ProBNP (last 3 results) Recent Labs    05/06/19 1201  PROBNP 431.0*     D-Dimer No results for input(s): DDIMER in the last 72 hours. Hemoglobin A1C No results for input(s): HGBA1C in the last 72 hours. Fasting Lipid Panel No results for input(s): CHOL, HDL, LDLCALC, TRIG, CHOLHDL, LDLDIRECT in the last 72 hours. Thyroid Function Tests No results for input(s): TSH, T4TOTAL, T3FREE, THYROIDAB in the last 72 hours.  Invalid input(s): FREET3  Other results:   Imaging    No results found.  Medications:     Scheduled Medications: . ezetimibe  10 mg Oral Daily  . levothyroxine  25 mcg Oral Q0600  . pantoprazole  40 mg Oral Q0600  . rosuvastatin  40 mg Oral Daily    Infusions:   PRN Medications: acetaminophen **OR** acetaminophen, ondansetron **OR** ondansetron (ZOFRAN) IV, polyethylene glycol    Patient Profile   AKUA BLETHEN is an 86 y.o.withhistory of permanent atrial fibrillation, chronic diastolic CHF, CKD stage 3, smoking/prior COPD, renal cell CA s/p right nephrectomy. She has a prior history of HFrEF with  nonischemic cardiomyopathy and had a Medtronic ICD placed. However, subsequently her EF has recovered. More recently, she has had symptomatic HFpEF. She came to the ER for evaluation.  HS-TnI 28=>26.  Hgb 6.2 and FOBT+, she received 2 units PRBCs and Lasix 80 mg IV.  INR was 4.7, she received po vitamin K.   Assessment/Plan   1. Anemia:  Baseline Hgb 8-10 with hx of Fe deficiency anemia. 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  She has been getting regular Feraheme infusions. Present with Hgb 6.2, positive FOBT with supratherapeutic INR. She has received total of 5 mg vitamin K and 3 units of PRBCs.  - endoscopy this admit showed bleeding angioectasias treated w/ APC - Hgb stable today at 7.6 - GI has cleared to resume a/c today.  - Given high risk for CVA from AF, would favor eventually restarting her on Eliquis rather than warfarin  Would use 2.5 mg bid.  2. Acute on chronic diastolic CHF: Echo in 9/41 showed EF 60-65%, mild RV dilation with normal systolic function, PASP 93 mmHg. She had remote nonischemic cardiomyopathy with recovery of EF, has Medtronic ICD. RHC11/20showed elevated filling pressures and pulmonary venous hypertension. Prominent v-waves on PCWP tracing likely due to stiff ventricle/diastolic dysfunction as she had only mild MR on echo. - Echo 03/27/2020 showed EF 65-70%, mod LVH, moderate AS,  mild-moderate MR and severe TR.  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 PASP very high at 86. - Diuretics currently on hold for AKI (improving). Scr/BUN trending down but still above baseline. Volume status ok on exam today. Would continue to hold diuretics today. Possibly restart tomorrow.  3. Pulmonary hypertension: Based on RHC11/20, she has primarily pulmonary venous hypertension.  4. CKD: Stage 3. hx of RCC s/p R nephrectomy.  Baseline Cr around 1.3-1.4. SCr peaked to 1.92 this admit. BUN up to 62. Diuretics on hold. Scr trending down, 1.723 today.  BUN 48 - Follow BMET closely.  6. Atrial fibrillation: Permanent now. Rate control is reasonable. She has failed amiodarone, Tikosyn, and flecainide as well as multiple cardioversions.  - on Warfain PTA, held for GIB  - See discussion above => would like to get her back on anticoagulation, but would use Eliquis 2.5 mg bid when restarted.  7. Chest pain:  Atypical chest pain, associated sometimes with cough, not clearly exertional.  Hs-TnI slightly elevated with no trend.  Possible demand ischemia from anemia and volume overload. Think unlikely this is ACS.   - Would not work up further at this time.  8. Staring Spell: "staring spell" on 11/23.  Unresponsive for about a minute but telemetry and BP unremarkable (BP done just after event).  Head CT negative. Daughter witnessed. EEG unremarkable. No seizures or epileptiform discharges - no further episodes  9. Valvular Disease: by echo, she has at least moderate AS along with mild-moderate MR and severe TR. - follow as outpatient  Length of Stay: [address] PA-C  03/31/2020, 9:29 AM   Advanced Heart Failure Team Pager [phone] (M-F; 7a - 4p)  Please contact [facility] for night-coverage after hours (4p -7a ) and weekends on [email or web address]  Patient seen and examined with the above-signed Advanced Practice Provider and/or Housestaff. I personally reviewed laboratory data, imaging studies and relevant notes. I independently examined the patient and formulated the important aspects of the plan. I have edited the note to reflect any of my changes or salient points. I have personally discussed the plan with the patient and/or family.  Results of SBE noted. Actively bleeding SB AVM treated with APC. hgb stable today. Ok to restart [facility] per GI. Renal function improving slowly. Feels weak.   General:  Elderly fatigued appearing. No resp difficulty HEENT: normal Neck: supple. JVP to jaw with prominent CV waves . Carotids 2+ bilat; +  bruits. No lymphadenopathy or thryomegaly appreciated. Cor: PMI nondisplaced. Irregular rate & rhythm. 2/6 AS Lungs: clear Abdomen: soft, nontender, nondistended. No hepatosplenomegaly. No bruits or masses. Good bowel sounds. Extremities: no cyanosis, clubbing, rash, edema Neuro: alert & orientedx3, cranial nerves grossly intact. moves all 4 extremities w/o difficulty. Affect pleasant  SB AVMs treated yesterday. Agree with low-dose Eliquis. Volume status stable. Hold diuretics one more day.   Glori Bickers, MD  10:19 AM

## 2020-03-31 NOTE — Progress Notes (Signed)
Physical Therapy Treatment Patient Details Name: [name] MRN: [id number] DOB: 06-Feb-1934 Today's Date: 03/31/2020    History of Present Illness Pt is an 86 y.o. female admitted 03/26/20 with generalized weakness, SOB and lightheadedness. Workup for symptomatic anemia; (+) FOBT. Course complicated by adverse reaction to transfusion. On 11/23, pt with episode of "staring into space" observed by daughter. Head CT showed no acute findings; potential for EEG. PMH includes COPD, afib, CKD 3, CHF, ICD placement, renal cell CA.    PT Comments    Pt continues to be weak with limited gait distance and endurance.  Daughter in room at end of session and we had a long discussion with pt about trying to prevent repeated hospitalizations by having home services involved.  Pt's initial preference was to call her primary care when and if she determined she felt she needed them, however, per daughter she has been know to make poor decisions related to her own health (pt admitted she waited too long to come to the hospital this admission).  She was ultimately agreeable to at least [facility] if she can have it, so PT reached out to SW to make him aware.    Follow Up Recommendations  Home health PT;Supervision - Intermittent     Equipment Recommendations  None recommended by PT    Recommendations for Other Services       Precautions / Restrictions Precautions Precautions: Fall Restrictions Weight Bearing Restrictions: No    Mobility  Bed Mobility Overal bed mobility: Independent             General bed mobility comments: Pt OOB in the recliner chair after working with OT  Transfers Overall transfer level: Needs assistance Equipment used: 1 person hand held assist Transfers: Sit to/from Stand Sit to Stand: Min guard         General transfer comment: Min guard assist due to pt reaching for sink and rolling table.  Switched to walker because I did not have a cane (pt uses a modified  broom handle at times for balance).   Ambulation/Gait Ambulation/Gait assistance: Min guard Gait Distance (Feet): 45 Feet Assistive device: Rolling walker (2 wheeled) Gait Pattern/deviations: Step-through pattern;Shuffle Gait velocity: decreased Gait velocity interpretation: 1.31 - 2.62 ft/sec, indicative of limited community ambulator General Gait Details: Pt reporting generalized weakness, could not go very far and had the support of the RW.  I asked if she had a walker with a seat on it as this could be beneficial if she feels weak to sit down and she reports she gave it away.    Stairs             Wheelchair Mobility    Modified Rankin (Stroke Patients Only)       Balance Overall balance assessment: Needs assistance Sitting-balance support: Feet supported;Bilateral upper extremity supported Sitting balance-Leahy Scale: Good     Standing balance support: Single extremity supported Standing balance-Leahy Scale: Fair Standing balance comment: can stand at sink with both hands free statically with supervision, however, when doing anything dynamic is reaching for at least one hand support.                             Cognition Arousal/Alertness: Awake/alert Behavior During Therapy: WFL for tasks assessed/performed Overall Cognitive Status: Within Functional Limits for tasks assessed  Exercises      General Comments General comments (skin integrity, edema, etc.): I spoke at length with pt and her daughter trying to convince her that preventative medicine is better than reactive medicine as it relates to home health services (ie go ahead and have it in place instead of waiting a few days to "see").  She was ultimately agreeable to our requests, so SW made aware.       Pertinent Vitals/Pain Pain Assessment: No/denies pain    Home Living Family/patient expects to be discharged to:: Private  residence Living Arrangements: Other relatives (great- grandson) Available Help at Discharge: Family;Available PRN/intermittently Type of Home: House Home Access: Stairs to enter Entrance Stairs-Rails: Right;Left;Can reach both Home Layout: One level Home Equipment: Environmental consultant - 2 wheels;Cane - single point;Bedside commode;Grab bars - toilet;Grab bars - tub/shower;Hand held shower head Additional Comments: Lives with great-grandson who works and goes to school, but able to assist pt as needed in evenings    Prior Function Level of Independence: Independent with assistive device(s)      Comments: Pt uses a broom to walk with (with the broom part really flattened out), drives short distances, does her own laundry-cooking-cleaning with great grandson assisting as needed.   PT Goals (current goals can now be found in the care plan section) Acute Rehab PT Goals Patient Stated Goal: to go home Progress towards PT goals: Progressing toward goals    Frequency    Min 3X/week      PT Plan Current plan remains appropriate    Co-evaluation              AM-PAC PT "6 Clicks" Mobility   Outcome Measure  Help needed turning from your back to your side while in a flat bed without using bedrails?: None Help needed moving from lying on your back to sitting on the side of a flat bed without using bedrails?: None Help needed moving to and from a bed to a chair (including a wheelchair)?: A Little Help needed standing up from a chair using your arms (e.g., wheelchair or bedside chair)?: A Little Help needed to walk in hospital room?: A Little Help needed climbing 3-5 steps with a railing? : A Little 6 Click Score: 20    End of Session   Activity Tolerance: Patient limited by fatigue Patient left: in bed;Other (comment);with family/visitor present (seated EOB with daughter in room. )   PT Visit Diagnosis: Other abnormalities of gait and mobility (R26.89);Muscle weakness (generalized)  (M62.81)     Time: 1308-6578 PT Time Calculation (min) (ACUTE ONLY): 35 min  Charges:  $Gait Training: 8-22 mins $Self Care/Home Management: 8-22                     Verdene Lennert, PT, DPT  Acute Rehabilitation [phone] pager [phone]) [phone] office

## 2020-03-31 NOTE — Progress Notes (Addendum)
ANTICOAGULATION CONSULT NOTE - Initial Consult  Pharmacy Consult for apixaban Indication: atrial fibrillation  Allergies  Allergen Reactions  . Dilaudid [Hydromorphone] Other (See Comments)    Excessive Somnolence- Required Narcan  . Fentanyl Other (See Comments)    Required Narcan when given with Dilaudid. Tolerated single dose when given during procedure 02/03/2019.   Marland Kitchen Codeine Nausea And Vomiting    Hallucinations   . Morphine And Related Nausea And Vomiting    Hallucinations   . Sulfonamide Derivatives Other (See Comments)    Dry mouth    Patient Measurements: Height: 5\' 6"  (167.6 cm) Weight: 68.6 kg (151 lb 3.8 oz) IBW/kg (Calculated) : 59.3  Vital Signs: Temp: 97.8 F (36.6 C) (11/26 0543) Temp Source: Oral (11/26 0543) BP: 111/46 (11/26 0543) Pulse Rate: 61 (11/26 0543)  Labs: Recent Labs    03/28/20 1343 03/28/20 1343 03/29/20 0214 03/29/20 0732 03/29/20 0732 03/30/20 0217 03/31/20 0057  HGB 7.9*   < >  --  7.4*   < > 7.3* 7.6*  HCT 25.4*   < >  --  24.3*  --  24.7* 25.8*  PLT 204   < >  --  196  --  214 241  LABPROT  --   --  22.7*  --   --  16.4* 16.3*  INR  --   --  2.1*  --   --  1.4* 1.4*  CREATININE  --   --  1.92*  --   --  1.71* 1.73*  TROPONINIHS 49*  --   --   --   --   --   --    < > = values in this interval not displayed.    Estimated Creatinine Clearance: 22.7 mL/min (A) (by C-G formula based on SCr of 1.73 mg/dL (H)). Assessment: 86 yo f with afib  Worked up here for GI bleed - enteroscopy showed 1 bleeding AVD May need re-treatment as outpatient  CBC stable  Plan:  apixaban 2.5 bid - closer to cutoffs for higher dose but d/t recent AVM bleeding will do lower dose  Barth Kirks, PharmD, BCPS, BCCCP Clinical Pharmacist [phone]  Please check AMION for all [place] numbers  03/31/2020 9:50 AM

## 2020-03-31 NOTE — Progress Notes (Signed)
Patient taken to discharge area via wheelchair by RN. Discharged to care of daughter.

## 2020-03-31 NOTE — Discharge Instructions (Signed)
Anemia  Anemia is a condition in which you do not have enough red blood cells or hemoglobin. Hemoglobin is a substance in red blood cells that carries oxygen. When you do not have enough red blood cells or hemoglobin (are anemic), your body cannot get enough oxygen and your organs may not work properly. As a result, you may feel very tired or have other problems. What are the causes? Common causes of anemia include:  Excessive bleeding. Anemia can be caused by excessive bleeding inside or outside the body, including bleeding from the intestine or from periods in women.  Poor nutrition.  Long-lasting (chronic) kidney, thyroid, and liver disease.  Bone marrow disorders.  Cancer and treatments for cancer.  HIV (human immunodeficiency virus) and AIDS (acquired immunodeficiency syndrome).  Treatments for HIV and AIDS.  Spleen problems.  Blood disorders.  Infections, medicines, and autoimmune disorders that destroy red blood cells. What are the signs or symptoms? Symptoms of this condition include:  Minor weakness.  Dizziness.  Headache.  Feeling heartbeats that are irregular or faster than normal (palpitations).  Shortness of breath, especially with exercise.  Paleness.  Cold sensitivity.  Indigestion.  Nausea.  Difficulty sleeping.  Difficulty concentrating. Symptoms may occur suddenly or develop slowly. If your anemia is mild, you may not have symptoms. How is this diagnosed? This condition is diagnosed based on:  Blood tests.  Your medical history.  A physical exam.  Bone marrow biopsy. Your health care provider may also check your stool (feces) for blood and may do additional testing to look for the cause of your bleeding. You may also have other tests, including:  Imaging tests, such as a CT scan or MRI.  Endoscopy.  Colonoscopy. How is this treated? Treatment for this condition depends on the cause. If you continue to lose a lot of blood, you  may need to be treated at a hospital. Treatment may include:  Taking supplements of iron, vitamin T01, or folic acid.  Taking a hormone medicine (erythropoietin) that can help to stimulate red blood cell growth.  Having a blood transfusion. This may be needed if you lose a lot of blood.  Making changes to your diet.  Having surgery to remove your spleen. Follow these instructions at home:  Take over-the-counter and prescription medicines only as told by your health care provider.  Take supplements only as told by your health care provider.  Follow any diet instructions that you were given.  Keep all follow-up visits as told by your health care provider. This is important. Contact a health care provider if:  You develop new bleeding anywhere in the body. Get help right away if:  You are very weak.  You are short of breath.  You have pain in your abdomen or chest.  You are dizzy or feel faint.  You have trouble concentrating.  You have bloody or black, tarry stools.  You vomit repeatedly or you vomit up blood. Summary  Anemia is a condition in which you do not have enough red blood cells or enough of a substance in your red blood cells that carries oxygen (hemoglobin).  Symptoms may occur suddenly or develop slowly.  If your anemia is mild, you may not have symptoms.  This condition is diagnosed with blood tests as well as a medical history and physical exam. Other tests may be needed.  Treatment for this condition depends on the cause of the anemia. This information is not intended to replace advice given to you  by your health care provider. Make sure you discuss any questions you have with your health care provider. Document Revised: 04/04/2017 Document Reviewed: 05/24/2016 Elsevier Patient Education  [place].  Gastrointestinal Bleeding Gastrointestinal (GI) bleeding is bleeding somewhere along the path that food travels through the body (digestive  tract). This path is anywhere between the mouth and the opening of the butt (anus). You may have blood in your poop (stool) or have black poop. If you throw up (vomit), there may be blood in it. This condition can be mild, serious, or even life-threatening. If you have a lot of bleeding, you may need to stay in the hospital. What are the causes? This condition may be caused by:  Irritation and swelling of the esophagus (esophagitis). The esophagus is part of the body that moves food from your mouth to your stomach.  Swollen veins in the butt (hemorrhoids).  Areas of painful tearing in the opening of the butt (anal fissures). These are often caused by passing hard poop.  Pouches that form on the colon over time (diverticulosis).  Irritation and swelling (diverticulitis) in areas where pouches have formed on the colon.  Growths (polyps) or cancer. Colon cancer often starts out as growths that are not cancer.  Irritation of the stomach lining (gastritis).  Sores (ulcers) in the stomach. What increases the risk? You are more likely to develop this condition if you:  Have a certain type of infection in your stomach (Helicobacter pylori infection).  Take certain medicines.  Smoke.  Drink alcohol. What are the signs or symptoms? Common symptoms of this condition include:  Throwing up (vomiting) material that has bright red blood in it. It may look like coffee grounds.  Changes in your poop. The poop may: ? Have red blood in it. ? Be black, look like tar, and smell stronger than normal. ? Be red.  Pain or cramping in the belly (abdomen). How is this treated? Treatment for this condition depends on the cause of the bleeding. For example:  Sometimes, the bleeding can be stopped during a procedure that is done to find the problem (endoscopy or colonoscopy).  Medicines can be used to: ? Help control irritation, swelling, or infection. ? Reduce acid in your stomach.  Certain  problems can be treated with: ? Creams. ? Medicines that are put in the butt (suppositories). ? Warm baths.  Surgery is sometimes needed.  If you lose a lot of blood, you may need a blood transfusion. If bleeding is mild, you may be allowed to go home. If there is a lot of bleeding, you will need to stay in the hospital. Follow these instructions at home:   Take over-the-counter and prescription medicines only as told by your doctor.  Eat foods that have a lot of fiber in them. These foods include beans, whole grains, and fresh fruits and vegetables. You can also try eating 1-3 prunes each day.  Drink enough fluid to keep your pee (urine) pale yellow.  Keep all follow-up visits as told by your doctor. This is important. Contact a doctor if:  Your symptoms do not get better. Get help right away if:  Your bleeding does not stop.  You feel dizzy or you pass out (faint).  You feel weak.  You have very bad cramps in your back or belly.  You pass large clumps of blood (clots) in your poop.  Your symptoms are getting worse.  You have chest pain or fast heartbeats. Summary  GI  bleeding is bleeding somewhere along the path that food travels through the body (digestive tract).  This bleeding can be caused by many things. Treatment depends on the cause of the bleeding.  Take medicines only as told by your doctor.  Keep all follow-up visits as told by your doctor. This is important. This information is not intended to replace advice given to you by your health care provider. Make sure you discuss any questions you have with your health care provider. Document Revised: 12/03/2017 Document Reviewed: 12/03/2017 Elsevier Patient Education  2020 [place] on my medicine - ELIQUIS (apixaban)  Why was Eliquis prescribed for you? Eliquis was prescribed for you to reduce the risk of a blood clot forming that can cause a stroke if you have a medical condition called  atrial fibrillation (a type of irregular heartbeat).  What do You need to know about Eliquis ? Take your Eliquis TWICE DAILY - one tablet in the morning and one tablet in the evening with or without food. If you have difficulty swallowing the tablet whole please discuss with your pharmacist how to take the medication safely.  Take Eliquis exactly as prescribed by your doctor and DO NOT stop taking Eliquis without talking to the doctor who prescribed the medication.  Stopping may increase your risk of developing a stroke.  Refill your prescription before you run out.  After discharge, you should have regular check-up appointments with your healthcare provider that is prescribing your Eliquis.  In the future your dose may need to be changed if your kidney function or weight changes by a significant amount or as you get older.  What do you do if you miss a dose? If you miss a dose, take it as soon as you remember on the same day and resume taking twice daily.  Do not take more than one dose of ELIQUIS at the same time to make up a missed dose.  Important Safety Information A possible side effect of Eliquis is bleeding. You should call your healthcare provider right away if you experience any of the following: ? Bleeding from an injury or your nose that does not stop. ? Unusual colored urine (red or dark brown) or unusual colored stools (red or black). ? Unusual bruising for unknown reasons. ? A serious fall or if you hit your head (even if there is no bleeding).  Some medicines may interact with Eliquis and might increase your risk of bleeding or clotting while on Eliquis. To help avoid this, consult your healthcare provider or pharmacist prior to using any new prescription or non-prescription medications, including herbals, vitamins, non-steroidal anti-inflammatory drugs (NSAIDs) and supplements.  This website has more information on Eliquis (apixaban):  http://www.eliquis.com/eliquis/home

## 2020-03-31 NOTE — TOC Transition Note (Signed)
Transition of Care [facility]) - CM/SW Discharge Note   Patient Details  Name: [name] MRN: [id number] Date of Birth: Apr 25, 1934  [facility] CM/SW Contact:  Joanne Chars, LCSW Phone Number: 03/31/2020, 3:19 PM   Clinical Narrative:  Pt discharging home with [facility] RN services.  Pt still declining HH PT/OT.  Daughter here to transport.  TOC pharmacy filling prescriptions.        Barriers to Discharge: No Barriers Identified   Patient Goals and CMS Choice Patient states their goals for this hospitalization and ongoing recovery are:: "be able to do my own stuff" CMS Medicare.gov Compare Post Acute Care list provided to:: Patient Choice offered to / list presented to : Patient  Discharge Placement                       Discharge Plan and Services     Post Acute Care Choice:  (pt is declining [place] at this time, also declining outpt PT)          DME Arranged: N/A         HH Arranged: RN HH Agency: [facility] Date [place]: 03/31/20 Time [place]: 4098 Representative spoke with at [place]: [place] ([place]) Interventions     Readmission Risk Interventions Readmission Risk Prevention Plan 03/31/2020 03/17/2019 02/26/2019  Transportation Screening Complete Complete Complete  PCP or Specialist Appt within 3-5 Days - - -  [place] or [place] for [place] - - -  Medication Review Press photographer) - Complete Complete  PCP or Specialist appointment within 3-5 days of discharge Not Complete Complete -  PCP/Specialist Appt Not Complete comments office closed for Thanksgiving holiday - -  [place] or [place] Complete Complete -  SW Recovery Care/Counseling Consult Complete Complete Complete  Palliative Care Screening Not Applicable Not Applicable Not Applicable  Skilled Nursing Facility  Not Applicable Not Applicable Not Applicable  Some recent data might be hidden

## 2020-03-31 NOTE — Evaluation (Signed)
Occupational Therapy Evaluation Patient Details Name: [name] MRN: [id number] DOB: Dec 07, 1933 Today's Date: 03/31/2020    History of Present Illness Pt is an 86 y.o. female admitted 03/26/20 with generalized weakness, SOB and lightheadedness. Workup for symptomatic anemia; (+) FOBT. Course complicated by adverse reaction to transfusion. On 11/23, pt with episode of "staring into space" observed by daughter. Head CT showed no acute findings; potential for EEG. PMH includes COPD, afib, CKD 3, CHF, ICD placement, renal cell CA.   Clinical Impression   This 86 yo female admitted with above presents to acute OT with PLOF of being totally independent with basic ADLs and mostly IADLs. Currently she is deconditioned from hospital stay and is at a min guard to S level for basic ADLs. Feel she would benefit from acute OT with follow up HHOT--pt is reluctant to get [facility] services set up prior to [facility] (currently prefers to see if she needs it and then ask her PCP to get it for her)--PT in room continuing to talk to dtr and pt about this.    Follow Up Recommendations  Home health OT (with 24 hour S/prn A initially)    Equipment Recommendations  None recommended by OT       Precautions / Restrictions Precautions Precautions: Fall Restrictions Weight Bearing Restrictions: No      Mobility Bed Mobility Overal bed mobility: Independent                  Transfers Overall transfer level: Needs assistance Equipment used: None Transfers: Sit to/from Stand Sit to Stand: Supervision              Balance Overall balance assessment: Needs assistance Sitting-balance support: No upper extremity supported;Feet supported Sitting balance-Leahy Scale: Good     Standing balance support: No upper extremity supported   Standing balance comment: standing at sink to wash hands and "fix"hair                           ADL either performed or assessed with clinical judgement    ADL Overall ADL's : Modified independent Eating/Feeding: Independent;Sitting   Grooming: Supervision/safety;Standing   Upper Body Bathing: Supervision/ safety;Standing;Sitting   Lower Body Bathing: Supervison/ safety;Sit to/from stand   Upper Body Dressing : Supervision/safety;Sitting;Standing   Lower Body Dressing: Supervision/safety;Sit to/from stand   Toilet Transfer: Min guard;Ambulation Toilet Transfer Details (indicate cue type and reason): At home she uses broom to ambulate with and/or holds onto furniture. She does have a RW and a SPC, but prefers broom Toileting- Water quality scientist and Hygiene: Supervision/safety;Sit to/from stand         General ADL Comments: Talked with pt about a shower seat and how this would be more safe for her--I gave her handout on the different types; she will have to get it on her own due to insurance will not pay for this.     Vision Patient Visual Report: No change from baseline              Pertinent Vitals/Pain Pain Assessment: No/denies pain     Hand Dominance Right   Extremity/Trunk Assessment Upper Extremity Assessment Upper Extremity Assessment: Overall WFL for tasks assessed           Communication Communication Communication: No difficulties   Cognition Arousal/Alertness: Awake/alert Behavior During Therapy: WFL for tasks assessed/performed Overall Cognitive Status: Within Functional Limits for tasks assessed  Home Living Family/patient expects to be discharged to:: Private residence Living Arrangements: Other relatives (great- grandson) Available Help at Discharge: Family;Available PRN/intermittently Type of Home: House Home Access: Stairs to enter [employer] of Steps: 3 Entrance Stairs-Rails: Right;Left;Can reach both Home Layout: One level     Bathroom Shower/Tub: Teacher, early years/pre: Handicapped height      Home Equipment: Environmental consultant - 2 wheels;Cane - single point;Bedside commode;Grab bars - toilet;Grab bars - tub/shower;Hand held shower head   Additional Comments: Lives with great-grandson who works and goes to school, but able to assist pt as needed in evenings      Prior Functioning/Environment Level of Independence: Independent with assistive device(s)        Comments: Pt uses a broom to walk with (with the broom part really flattened out), drives short distances, does her own laundry-cooking-cleaning with great grandson assisting as needed.        OT Problem List: Impaired balance (sitting and/or standing)      OT Treatment/Interventions: Self-care/ADL training;Patient/family education;DME and/or AE instruction    OT Goals(Current goals can be found in the care plan section) Acute Rehab OT Goals Patient Stated Goal: to go home ADL Goals Pt Will Perform Grooming: Independently;standing Pt Will Perform Upper Body Bathing: sitting;Independently Pt Will Perform Lower Body Bathing: with modified independence;sit to/from stand Pt Will Perform Upper Body Dressing: Independently;sitting Pt Will Perform Lower Body Dressing: with modified independence;sit to/from stand Pt Will Transfer to Toilet: Independently;ambulating;grab bars (comfort height toilet) Pt Will Perform Toileting - Clothing Manipulation and hygiene: Independently;sit to/from stand  OT Frequency: Min 2X/week   Barriers to D/C: Decreased caregiver support  No one with her 24/7          AM-PAC OT "6 Clicks" Daily Activity     Outcome Measure Help from another person eating meals?: None Help from another person taking care of personal grooming?: A Little Help from another person toileting, which includes using toliet, bedpan, or urinal?: A Little Help from another person bathing (including washing, rinsing, drying)?: A Little Help from another person to put on and taking off regular upper body clothing?: A Little Help  from another person to put on and taking off regular lower body clothing?: A Little 6 Click Score: 19   End of Session Equipment Utilized During Treatment: Gait belt  Activity Tolerance: Patient tolerated treatment well Patient left: in chair (getting ready to work with PT in room)  OT Visit Diagnosis: Unsteadiness on feet (R26.81);Muscle weakness (generalized) (M62.81)                Time: 1950-9326 OT Time Calculation (min): 26 min Charges:  OT General Charges $OT Visit: 1 Visit OT Evaluation $OT Eval Moderate Complexity: 1 Mod OT Treatments $Self Care/Home Management : 8-22 mins  [address], OTR/L Acute [employer] Pager [phone] Office [phone]     Almon Register 03/31/2020, 2:17 PM

## 2020-03-31 NOTE — Discharge Summary (Signed)
Physician Discharge Summary  [name] PXT:[id number] DOB: 1933/12/10 DOA: 03/26/2020  PCP: Elby Beck, FNP  Admit date: 03/26/2020 Discharge date: 03/31/2020  Recommendations for Outpatient Follow-up:  1. Discharge to home. 2. Pt to follow up with PCP in 7-10 days. Have chemistry and CBC drawn at that visit. 3. Follow up with GI as needed. 4. Follow up with cardiology as directed.   Follow-up Information    [facility] Follow up on 04/12/2020.   Specialty: Cardiology Why: 11:30 AM [facility] Parking Garage Code 5555 Contact information: [address] [id number] [place] [phone]       Elby Beck, FNP Follow up.   Specialties: Nurse Practitioner, Family Medicine Why: Please call for a hospital follow up appointment on Monday, 11/29, when the office reopens.  Contact information: [place] [address] [place], [facility].   Specialty: [place] Why: [facility] first RN intake is Wednesday, 04/05/20.  Either an RN or a physical therapist will contact you for your intake.  Local phone number: [phone]. Contact information: [place] [address] [phone]              Discharge Diagnoses: Principal diagnosis is #1 1. Acute on chronic blood loss anemia 2. 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 Supertherapeutic INR 4. Syncope due to orthostasis 5. Atrial fibrillation - permanent 6. Hypothyroidism 7. Hyperlipidemia 8. Debility 9. COPD 10. CKD IIIb  Discharge Condition: Fair  Disposition: Home with home health RN, PT/OT  Diet recommendation: Heart healthy  Filed Weights   03/26/20 2342 03/29/20 1300  Weight: 65.8 kg 68.6 kg    History of present illness: 86 year old female with past medical history of atrial  fibrillation (on coumadin), diastolic congestive heart failure (Echo 10/2018 EF 60-65%), nonischemic cardiomyopathy status post ICD placement, hypothyroidism, hypertension, COPD, peripheral artery disease, chronic kidney disease stage IIIb, iron deficiency anemia and ischemic colitis who presents to [facility] emergency department with complaints of generalized weakness.  Patient explains that approximately 1/2 weeks ago she began to develop generalized weakness.  Initially this was mild in intensity but progressively became more more severe as the days progressed.  Weakness became associated with bouts of lightheadedness, particularly after rising from seated position.  Patient reports ongoing "black" stool however patient takes daily iron supplements.  Patient denies any hematemesis, epistaxis,  heavy bruising or hematuria.  As the days progressed patient's symptoms became more and more severe.  Patient began to develop associated shortness of breath, severe in intensity worse with movement and improved with rest.  Upon further questioning, patient denies chest pain, fevers, sick contacts, nausea, vomiting or diarrhea.  Patient denies alcohol use or NSAID use.  Patient states that she does take her Coumadin as instructed.  Due to patient's progressively worsening symptoms the patient eventually presented to [facility] emergency department for evaluation.  Upon evaluation in the emergency department, hemoglobin was found to be 6.2 without any clinical evidence of frank bleeding.  The emergency department provider ordered for 2 units of packed red blood cells to be initiated.  It was also determined the patient's INR was supratherapeutic at 4.7 therefore 2.5 mg of vitamin K were administered by the emergency department staff.  The hospitalist group was then called to assess the patient  for admission the hospital.  Hospital Course: 86 year old female with past medical history  ofpermanentatrial fibrillation(on coumadin),chronic diastolic congestive heart failure(Echo 10/2018 EF 60-65%),nonischemic cardiomyopathy status post ICD placement, hypothyroidism, hypertension, COPD, peripheral artery disease, chronic kidney disease stage IIIb, iron deficiency anemia and ischemic colitispresented with generalized weakness, chest pain and black stools. On presentation, hemoglobin was 6.2, INR was 4.7. She was transfused packed red cells, vitamin K was given and GI was consulted. Subsequently heart failure team and palliative care team were also consulted  An attempt was made to transfuse the patient with 1 unit of PRBC's on 03/28/2020. The patient complained of agitation, anxiety, tungue numbness, aching legs, and chest pain during the transfusion. Transfusion was stopped. 3 hours later the patient had a syncopal episode. Cardiology was consulted. CT head was performed and was negative. She is feeling better today.  GI performed push enteroscopy on 03/30/2020 as INR was less than 1.5. This study demonstrated 1 bleeding AVM in the small bowel as well as 2 non-bleeding AVM's in the jejunum. Her diet will be advanced today. She will be able to start systemic anticoagulatino with eliquis in the am as recommended by cardiology. Small bowel enteroscopy will need to be repeated for retreatment as necessary.   Today's assessment: S: The patient is sitting up at bedside. No new complaints. O: Vitals:  Vitals:   03/30/20 2156 03/31/20 0543  BP: 103/60 (!) 111/46  Pulse: 66 61  Resp: 20 16  Temp: 98.6 F (37 C) 97.8 F (36.6 C)  SpO2: 90% 94%   Exam:  Constitutional:   The patient is awake, alert, and oriented x 3. No acute distress. Respiratory:   No increased work of breathing.  No wheezes, rales, or rhonchi  No tactile fremitus Cardiovascular:   Regular rate and rhythm  No murmurs, ectopy, or gallups.  No lateral PMI. No thrills. Abdomen:   Abdomen is  soft, non-tender, non-distended  No hernias, masses, or organomegaly  Normoactive bowel sounds.  Musculoskeletal:   No cyanosis, clubbing, or edema Skin:   No rashes, lesions, ulcers  palpation of skin: no induration or nodules Neurologic:   CN 2-12 intact  Sensation all 4 extremities intact Psychiatric:   Mental status ? Mood, affect appropriate ? Orientation to person, place, time   judgment and insight appear intact  Discharge Instructions  Discharge Instructions    Activity as tolerated - No restrictions   Complete by: As directed    Call MD for:  difficulty breathing, headache or visual disturbances   Complete by: As directed    Call MD for:  persistant nausea and vomiting   Complete by: As directed    Diet - low sodium heart healthy   Complete by: As directed    Discharge instructions   Complete by: As directed    Discharge to home. Pt to follow up with PCP in 7-10 days. Have chemistry and CBC drawn at that visit. Follow up with GI as needed. Follow up with cardiology as directed.   Increase activity slowly   Complete by: As directed      Allergies as of 03/31/2020      Reactions   Dilaudid [hydromorphone] Other (See Comments)   Excessive Somnolence- Required Narcan   Fentanyl Other (See Comments)   Required Narcan when given with Dilaudid. Tolerated single dose when given during procedure 02/03/2019.    Codeine Nausea And Vomiting   Hallucinations    Morphine And Related Nausea And Vomiting   Hallucinations  Sulfonamide Derivatives Other (See Comments)   Dry mouth      Medication List    STOP taking these medications   conjugated estrogens vaginal cream Commonly known as: Premarin   warfarin 2 MG tablet Commonly known as: COUMADIN     TAKE these medications   acetaminophen 500 MG tablet Commonly known as: TYLENOL Take 500-1,000 mg by mouth every 8 (eight) hours as needed for mild pain or headache.   apixaban 2.5 MG Tabs  tablet Commonly known as: ELIQUIS Take 1 tablet (2.5 mg total) by mouth 2 (two) times daily. Dispense starter pack   AZO Cranberry Gummies 500 MG Chew Generic drug: Cranberry Chew 2,000 mg by mouth daily.   B-12 PO Take 1 tablet by mouth daily.   cetirizine 10 MG tablet Commonly known as: ZYRTEC Take 10 mg by mouth daily.   ezetimibe 10 MG tablet Commonly known as: ZETIA Take 1 tablet (10 mg total) by mouth daily.   ferrous sulfate 325 (65 FE) MG tablet Take 1 tablet (325 mg total) by mouth 2 (two) times daily with a meal.   fluticasone 50 MCG/ACT nasal spray Commonly known as: FLONASE Place 2 sprays into both nostrils daily as needed for allergies or rhinitis.   levothyroxine 25 MCG tablet Commonly known as: SYNTHROID TAKE 1 TABLET BY MOUTH ONCE DAILY BEFOREBREAKFAST What changed: See the new instructions.   magnesium oxide 400 MG tablet Commonly known as: MAG-OX Take 1 tablet (400 mg total) by mouth daily.   metolazone 2.5 MG tablet Commonly known as: ZAROXOLYN Take 1 tablet (2.5 mg total) by mouth 2 (two) times a week. On mondays and thursdays   pantoprazole 40 MG tablet Commonly known as: PROTONIX Take 1 tablet (40 mg total) by mouth daily at 6 (six) AM. Start taking on: April 01, 2020   potassium chloride SA 20 MEQ tablet Commonly known as: KLOR-CON Take 2 tablets (40 mEq total) by mouth 2 (two) times daily. 1 extra tab on metolazone days   rosuvastatin 40 MG tablet Commonly known as: CRESTOR Take 1 tablet (40 mg total) by mouth daily.   torsemide 100 MG tablet Commonly known as: DEMADEX Take 1 tablet (100 mg total) by mouth 2 (two) times daily.   vitamin C 250 MG tablet Commonly known as: ASCORBIC ACID Take 500 mg by mouth daily.   Vitamin D 50 MCG (2000 UT) Caps Take 4,000 Units by mouth daily.            Durable Medical Equipment  (From admission, onward)         Start     Ordered   03/31/20 1549  For home use only DME Shower stool   Once        03/31/20 1548         Allergies  Allergen Reactions  . Dilaudid [Hydromorphone] Other (See Comments)    Excessive Somnolence- Required Narcan  . Fentanyl Other (See Comments)    Required Narcan when given with Dilaudid. Tolerated single dose when given during procedure 02/03/2019.   Marland Kitchen Codeine Nausea And Vomiting    Hallucinations   . Morphine And Related Nausea And Vomiting    Hallucinations   . Sulfonamide Derivatives Other (See Comments)    Dry mouth    The results of significant diagnostics from this hospitalization (including imaging, microbiology, ancillary and laboratory) are listed below for reference.    Significant Diagnostic Studies: EEG  Result Date: 03/30/2020 Lora Havens, MD  03/30/2020  8:39 AM Patient Name: [name] MRN: [id number] Epilepsy Attending: Lora Havens Referring Physician/Provider: Dr Donnetta Simpers Date: 03/29/2020 Duration: 24.11 minutes Patient history: 86 year old female with syncopal episode with a prodrome of behavioral arrest.  EEG to evaluate for seizures. Level of alertness: Awake, drowsy AEDs during EEG study: None Technical aspects: This EEG study was done with scalp electrodes positioned according to the 10-20 International system of electrode placement. Electrical activity was acquired at a sampling rate of 500Hz  and reviewed with a high frequency filter of 70Hz  and a low frequency filter of 1Hz . EEG data were recorded continuously and digitally stored. Description: The posterior dominant rhythm consists of 9-10 Hz activity of moderate voltage (25-35 uV) seen predominantly in posterior head regions, symmetric and reactive to eye opening and eye closing. Drowsiness was characterized by attenuation of the posterior background rhythm.  Hyperventilation and photic stimulation were not performed.   IMPRESSION: This study is within normal limits. No seizures or epileptiform discharges were seen throughout the recording.  Lora Havens   CT HEAD WO CONTRAST  Result Date: 03/28/2020 CLINICAL DATA:  Syncopal episode EXAM: CT HEAD WITHOUT CONTRAST TECHNIQUE: Contiguous axial images were obtained from the base of the skull through the vertex without intravenous contrast. COMPARISON:  February 24, 2019 FINDINGS: Brain: There is no acute intracranial hemorrhage, mass effect, or edema. Gray-white differentiation is preserved. There is no extra-axial fluid collection. Ventricles and sulci are within normal limits in size and configuration. Patchy hypoattenuation in the supratentorial white matter is nonspecific but may reflect mild chronic microvascular ischemic changes similar to the prior study. Vascular: There is atherosclerotic calcification at the skull base. Skull: Calvarium is unremarkable. Sinuses/Orbits: No acute finding. Other: None. IMPRESSION: No acute intracranial abnormality. Electronically Signed   By: Macy Mis M.D.   On: 03/28/2020 20:27   DG Chest Portable 1 View  Result Date: 03/27/2020 CLINICAL DATA:  eva EXAM: PORTABLE CHEST 1 VIEW COMPARISON:  02/25/2019 FINDINGS: Cardiac pacemaker. Cardiac enlargement. No vascular congestion or edema. No focal consolidation. There is blunting of both costophrenic angles, unchanged since prior study, consistent with recurrent effusions or pleural thickening. No pneumothorax. Scattered calcified granulomas in the lungs. Slight interstitial pattern to the lungs likely represents fibrosis. Calcification of the aorta. Mediastinal contours appear intact. Sclerosis in the proximal left humerus is unchanged, likely and chondroma. IMPRESSION: Cardiac enlargement. No evidence of active pulmonary disease. Chronic effusions or pleural thickening in the costophrenic angles. Electronically Signed   By: Lucienne Capers M.D.   On: 03/27/2020 00:32   ECHOCARDIOGRAM COMPLETE  Result Date: 03/27/2020    ECHOCARDIOGRAM REPORT   Patient Name:   TALICIA SUI Date of Exam:  03/27/2020 Medical Rec #:  [id number]         Height:       66.0 in Accession #:    [id number]        Weight:       145.0 lb Date of Birth:  [birth year]         BSA:          1.744 m Patient Age:    86 years          BP:           137/57 mmHg Patient Gender: F                 HR:           73 bpm. Exam Location:  Inpatient Procedure:  2D Echo, Cardiac Doppler and Color Doppler Indications:    Congestive Heart Failure 428.0 / I50.9  History:        Patient has prior history of Echocardiogram examinations, most                 recent 10/06/2018. Cardiomyopathy and CHF, CAD, Arrythmias:Atrial                 Fibrillation, Signs/Symptoms:Chest Pain; Risk                 Factors:Hypertension.  Sonographer:    Bernadene Person RDCS Referring Phys: [id number] [place]  1. Left ventricular ejection fraction, by estimation, is 65 to 70%. The left ventricle has normal function. The left ventricle has no regional wall motion abnormalities. There is moderate left ventricular hypertrophy. Left ventricular diastolic parameters are indeterminate. There is the interventricular septum is flattened in systole and diastole, consistent with right ventricular pressure and volume overload.  2. Right ventricular systolic function is mildly reduced. The right ventricular size is normal. A n AICD wire is visualized. There is severely elevated pulmonary artery systolic pressure. The estimated right ventricular systolic pressure is 76.2 mmHg.  3. Left atrial size was mildly dilated.  4. Right atrial size was severely dilated.  5. The mitral valve is abnormal. Mild to moderate mitral valve regurgitation.  6. The tricuspid valve is abnormal. Tricuspid valve regurgitation is severe.  7. Valve area measures 0.98 cm2 by planimetry. The aortic valve is calcified. Aortic valve regurgitation is mild. Moderate to severe aortic valve stenosis. Aortic valve area, by VTI measures 0.95 cm. Aortic valve mean gradient measures 13.0 mmHg. Aortic  valve Vmax measures 2.42 m/s.  8. The inferior vena cava is dilated in size with <50% respiratory variability, suggesting right atrial pressure of 15 mmHg. Comparison(s): Changes from prior study are noted. 10/06/2018: LVEF 60-65%. RVSP 93 mmHg, mildto moderate TR. FINDINGS  Left Ventricle: Left ventricular ejection fraction, by estimation, is 65 to 70%. The left ventricle has normal function. The left ventricle has no regional wall motion abnormalities. The left ventricular internal cavity size was normal in size. There is  moderate left ventricular hypertrophy. The interventricular septum is flattened in systole and diastole, consistent with right ventricular pressure and volume overload. Left ventricular diastolic parameters are indeterminate. Right Ventricle: The right ventricular size is normal. No increase in right ventricular wall thickness. Right ventricular systolic function is mildly reduced. There is severely elevated pulmonary artery systolic pressure. The tricuspid regurgitant velocity is 4.21 m/s, and with an assumed right atrial pressure of 15 mmHg, the estimated right ventricular systolic pressure is 83.1 mmHg. Left Atrium: Left atrial size was mildly dilated. Right Atrium: Right atrial size was severely dilated. Pericardium: There is no evidence of pericardial effusion. Mitral Valve: The mitral valve is abnormal. Mild to moderate mitral annular calcification. Mild to moderate mitral valve regurgitation. Tricuspid Valve: The tricuspid valve is abnormal. Tricuspid valve regurgitation is severe. The flow in the hepatic veins is reversed during ventricular systole. Aortic Valve: Valve area measures 0.98 cm2 by planimetry. The aortic valve is calcified. Aortic valve regurgitation is mild. Aortic regurgitation PHT measures 294 msec. Moderate to severe aortic stenosis is present. Aortic valve mean gradient measures 13.0 mmHg. Aortic valve peak gradient measures 23.5 mmHg. Aortic valve area, by VTI measures  0.95 cm. Pulmonic Valve: The pulmonic valve was grossly normal. Pulmonic valve regurgitation is mild. Aorta: The aortic root and ascending aorta are structurally normal, with  no evidence of dilitation. Venous: The inferior vena cava is dilated in size with less than 50% respiratory variability, suggesting right atrial pressure of 15 mmHg. IAS/Shunts: No atrial level shunt detected by color flow Doppler. Additional Comments: A n AICD wire is visualized.  LEFT VENTRICLE PLAX 2D LVIDd:         3.80 cm LVIDs:         2.20 cm LV PW:         1.30 cm LV IVS:        1.30 cm LVOT diam:     1.60 cm LV SV:         49 LV SV Index:   28 LVOT Area:     2.01 cm  RIGHT VENTRICLE RV S prime:     6.73 cm/s TAPSE (M-mode): 1.3 cm LEFT ATRIUM             Index       RIGHT ATRIUM           Index LA diam:        5.20 cm 2.98 cm/m  RA Area:     27.40 cm LA Vol (A2C):   80.3 ml 46.03 ml/m RA Volume:   93.70 ml  53.72 ml/m LA Vol (A4C):   34.6 ml 19.84 ml/m LA Biplane Vol: 53.6 ml 30.73 ml/m  AORTIC VALVE AV Area (Vmax):    0.97 cm AV Area (Vmean):   1.02 cm AV Area (VTI):     0.95 cm AV Vmax:           242.33 cm/s AV Vmean:          170.000 cm/s AV VTI:            0.518 m AV Peak Grad:      23.5 mmHg AV Mean Grad:      13.0 mmHg LVOT Vmax:         117.00 cm/s LVOT Vmean:        85.900 cm/s LVOT VTI:          0.246 m LVOT/AV VTI ratio: 0.47 AI PHT:            294 msec  AORTA Ao Root diam: 2.90 cm Ao Asc diam:  2.80 cm MR PISA:        2.26 cm TRICUSPID VALVE MR PISA Radius: 0.60 cm  TR Peak grad:   70.9 mmHg                          TR Vmax:        421.00 cm/s                           SHUNTS                          Systemic VTI:  0.25 m                          Systemic Diam: 1.60 cm Lyman Bishop MD Electronically signed by Lyman Bishop MD Signature Date/Time: 03/27/2020/3:27:57 PM    Final     Microbiology: Recent Results (from the past 240 hour(s))  Respiratory Panel by RT PCR (Flu A&B, Covid) - Nasopharyngeal Swab      Status: None   Collection Time: 03/27/20  5:38 AM   Specimen: Nasopharyngeal Swab; Nasopharyngeal(NP)  swabs in vial transport medium  Result Value Ref Range Status   SARS Coronavirus 2 by RT PCR NEGATIVE NEGATIVE Final    Comment: (NOTE) SARS-CoV-2 target nucleic acids are NOT DETECTED.  The SARS-CoV-2 RNA is generally detectable in upper respiratoy specimens during the acute phase of infection. The lowest concentration of SARS-CoV-2 viral copies this assay can detect is 131 copies/mL. A negative result does not preclude SARS-Cov-2 infection and should not be used as the sole basis for treatment or other patient management decisions. A negative result may occur with  improper specimen collection/handling, submission of specimen other than nasopharyngeal swab, presence of viral mutation(s) within the areas targeted by this assay, and inadequate number of viral copies (<131 copies/mL). A negative result must be combined with clinical observations, patient history, and epidemiological information. The expected result is Negative.  Fact Sheet for Patients:  PinkCheek.be  Fact Sheet for Healthcare Providers:  GravelBags.it  This test is no t yet approved or cleared by the [place] FDA and  has been authorized for detection and/or diagnosis of SARS-CoV-2 by FDA under an Emergency Use Authorization (EUA). This EUA will remain  in effect (meaning this test can be used) for the duration of the COVID-19 declaration under Section 564(b)(1) of the Act, 21 U.S.C. section 360bbb-3(b)(1), unless the authorization is terminated or revoked sooner.     Influenza A by PCR NEGATIVE NEGATIVE Final   Influenza B by PCR NEGATIVE NEGATIVE Final    Comment: (NOTE) The Xpert Xpress SARS-CoV-2/FLU/RSV assay is intended as an aid in  the diagnosis of influenza from Nasopharyngeal swab specimens and  should not be used as a sole basis for  treatment. Nasal washings and  aspirates are unacceptable for Xpert Xpress SARS-CoV-2/FLU/RSV  testing.  Fact Sheet for Patients: PinkCheek.be  Fact Sheet for Healthcare Providers: GravelBags.it  This test is not yet approved or cleared by the [place] FDA and  has been authorized for detection and/or diagnosis of SARS-CoV-2 by  FDA under an Emergency Use Authorization (EUA). This EUA will remain  in effect (meaning this test can be used) for the duration of the  Covid-19 declaration under Section 564(b)(1) of the Act, 21  U.S.C. section 360bbb-3(b)(1), unless the authorization is  terminated or revoked. Performed at [facility], [place] [address], [place], [place] [address]      Labs: Basic Metabolic Panel: Recent Labs  Lab 03/27/20 1555 03/28/20 0245 03/29/20 0214 03/30/20 0217 03/31/20 0057  NA 135 135 134* 134* 135  K 3.8 3.9 4.2 4.3 4.0  CL 95* 99 96* 98 97*  CO2 29 26 25 28 25   GLUCOSE 118* 101* 101* 107* 109*  BUN 55* 58* 62* 60* 48*  CREATININE 1.89* 1.81* 1.92* 1.71* 1.73*  CALCIUM 8.2* 8.3* 8.2* 8.7* 8.8*  MG  --  2.2  --   --   --    Liver Function Tests: Recent Labs  Lab 03/27/20 0001 03/27/20 1555  AST 17 21  ALT 12 11  ALKPHOS 40 35*  BILITOT 0.9 1.7*  PROT 5.8* 5.3*  ALBUMIN 3.5 3.0*   No results for input(s): LIPASE, AMYLASE in the last 168 hours. No results for input(s): AMMONIA in the last 168 hours. CBC: Recent Labs  Lab 03/27/20 0001 03/27/20 1555 03/28/20 0245 03/28/20 1343 03/29/20 0732 03/30/20 0217 03/31/20 0057  WBC 5.1   < > 5.6 5.9 4.8 5.6 6.0  NEUTROABS 3.6  --  4.2  --   --   --   --  HGB 6.2*   < > 7.4* 7.9* 7.4* 7.3* 7.6*  HCT 21.7*   < > 24.7* 25.4* 24.3* 24.7* 25.8*  MCV 93.1   < > 88.5 88.8 88.0 89.5 91.2  PLT 262   < > 217 204 196 214 241   < > = values in this interval not displayed.   Cardiac Enzymes: No results for input(s): CKTOTAL, CKMB,  CKMBINDEX, TROPONINI in the last 168 hours. BNP: BNP (last 3 results) Recent Labs    06/03/19 1508 03/27/20 0001  BNP 386.1* 252.8*    ProBNP (last 3 results) Recent Labs    05/06/19 1201  PROBNP 431.0*    CBG: No results for input(s): GLUCAP in the last 168 hours.  Principal Problem:   Symptomatic anemia Active Problems:   Mixed hyperlipidemia   Permanent atrial fibrillation   COPD (chronic obstructive pulmonary disease) (HCC)   Chronic kidney disease, stage 3b (HCC)   Acute on chronic diastolic CHF (congestive heart failure) (HCC)   Supratherapeutic INR   Hypothyroidism   Palliative care by specialist   Goals of care, counseling/discussion   GI bleed   AVM (arteriovenous malformation) of small bowel, acquired with hemorrhage   Gastric polyps   Time coordinating discharge: 38 minutes  Signed:        Hashim Eichhorst, DO [facility]  03/31/2020, 3:55 PM

## 2020-03-31 NOTE — Progress Notes (Signed)
Completed discharge teaching with patient and daughter. Verbalized understanding of medications as well as follow-up appointments.  Removed PIV and patient gathering belongings.  Waiting for pharmacy to deliver medications and then will take to discharge area for daughter to pick up.

## 2020-03-31 NOTE — TOC Initial Note (Signed)
Transition of Care [facility]) - Initial/Assessment Note    Patient Details  Name: [name] MRN: [id number] Date of Birth: Sep 25, 1933  Transition of Care [facility]) CM/SW Contact:    Joanne Chars, LCSW Phone Number: 03/31/2020, 1:16 PM  Clinical Narrative:   CSW met with pt and daughter [name] to discuss discharge plan.  Permission given to speak with daughter.  Pt declining HH services at this time, also declining outpt PT.  Daughter in agreement with this.  Pt saying she wants to go home and "see if she needs it."  Pt also declining outpt PT currently.  Choice document given, pt has worked with [facility] before, [facility] also.  We discussed that pt would need to coordinate with PCP if she changes her mind and wants [place] later.  Pt is vaccinated for covid.                  Expected Discharge Plan: [place] Barriers to Discharge: No Barriers Identified   Patient Goals and CMS Choice Patient states their goals for this hospitalization and ongoing recovery are:: "be able to do my own stuff" CMS Medicare.gov Compare Post Acute Care list provided to:: Patient Choice offered to / list presented to : Patient  Expected Discharge Plan and Services Expected Discharge Plan: [place]     Post Acute Care Choice:  (pt is declining HH at this time, also declining outpt PT) Living arrangements for the past 2 months: Single Family Home                                      Prior Living Arrangements/Services Living arrangements for the past 2 months: Single Family Home Lives with:: Relatives Patient language and need for interpreter reviewed:: Yes Do you feel safe going back to the place where you live?: Yes      Need for Family Participation in Patient Care: No (Comment) Care giver support system in place?: Yes (comment) Current home services: Other (comment) (none) Criminal Activity/Legal Involvement Pertinent to Current  Situation/Hospitalization: No - Comment as needed  Activities of Daily Living Home Assistive Devices/Equipment: Cane (specify quad or straight) (straight, PRN) ADL Screening (condition at time of admission) Patient's cognitive ability adequate to safely complete daily activities?: Yes Is the patient deaf or have difficulty hearing?: No Does the patient have difficulty seeing, even when wearing glasses/contacts?: Yes Does the patient have difficulty concentrating, remembering, or making decisions?: No Patient able to express need for assistance with ADLs?: Yes Does the patient have difficulty dressing or bathing?: No Independently performs ADLs?: Yes (appropriate for developmental age) Does the patient have difficulty walking or climbing stairs?: Yes Weakness of Legs: Both Weakness of Arms/Hands: Both  Permission Sought/Granted Permission sought to share information with : Family Supports Permission granted to share information with : Yes, Verbal Permission Granted  Share Information with NAME: daughters, [name] and [name]           Emotional Assessment Appearance:: Appears stated age Attitude/Demeanor/Rapport: Engaged Affect (typically observed): Appropriate, Pleasant Orientation: : Oriented to Self, Oriented to Place, Oriented to  Time, Oriented to Situation Alcohol / Substance Use: Not Applicable Psych Involvement: No (comment)  Admission diagnosis:  Symptomatic anemia [D64.9] GI bleed [K92.2] Patient Active Problem List   Diagnosis Date Noted  . AVM (arteriovenous malformation) of small bowel, acquired with hemorrhage   . Gastric polyps   .  GI bleed 03/28/2020  . Supratherapeutic INR 03/27/2020  . Hypothyroidism 03/27/2020  . Palliative care by specialist   . Goals of care, counseling/discussion   . Iron deficiency anemia 01/06/2020  . Cellulitis of left leg 11/11/2019  . History of GI bleed 11/11/2019  . Chronic venous hypertension w ulceration ([place])   . Melena   .  AKI (acute kidney injury) ([place]) 03/03/2019  . Symptomatic anemia 02/25/2019  . Acute on chronic diastolic CHF (congestive heart failure) ([place]) 11/04/2018  . Acute blood loss anemia 11/04/2018  . Chronic bilateral pleural effusions 11/04/2018  . Shortness of breath 10/21/2018  . Displaced fracture of left femoral neck ([place]) 10/09/2018  . Somnolence   . Closed left hip fracture ([place]) 10/07/2018  . Fall   . Hypokalemia   . Afib ([name]) 06/01/2018  . Urinary frequency 09/18/2017  . Iliac artery stenosis, bilateral ([place]) 07/14/2017  . Recurrent UTI 07/14/2017  . Long term (current) use of anticoagulants 05/08/2017  . Anemia 11/18/2016  . Dysuria 11/18/2016  . Pulmonary hypertension ([place]) 12/04/2015  . B12 deficiency 05/22/2015  . PAD (peripheral artery disease) ([place])   . Elevated serum creatinine   . Renal cell carcinoma ([place])   . History of nephrectomy secondary to [place]   . TIA (transient ischemic attack) 08/16/2014  . Weakness 01/14/2014  . Chronic kidney disease (CKD), stage IV (severe) ([place]) 12/08/2013  . Preoperative cardiovascular examination 06/07/2013  . Chronic diastolic CHF (congestive heart failure) ([place]) 06/04/2013  . Mitral valve regurgitation 06/04/2013  . Allergic rhinitis 01/01/2013  . Chronic cholecystitis with calculus 09/15/2012  . Claudication ([place]) 01/21/2012  . Intermittent lightheadedness 11/26/2011  . Hypertension 05/20/2011  . Avascular necrosis of hip ([place]) 05/03/2011  . OTHER AND UNSPECIFIED COAGULATION DEFECTS 10/13/2009  . RECTAL BLEEDING 10/13/2009  . HOCM / IHSS 07/14/2009  . EDEMA 07/14/2009  . Mixed hyperlipidemia 04/10/2009  . ADJUSTMENT DISORDER WITH ANXIETY 04/10/2009  . INCONTINENCE, URGE 04/10/2009  . Non-ischemic cardiomyopathy ([place]) 10/27/2008  . Permanent atrial fibrillation 10/27/2008  . SINUS BRADYCARDIA 10/27/2008  . Carotid artery stenosis 10/27/2008  . COPD (chronic obstructive pulmonary disease) ([place]) 10/27/2008  . Chronic kidney  disease, stage 3b ([place]) 10/27/2008  . ICD (implantable cardioverter-defibrillator) in place 10/27/2008   PCP:  Elby Beck, FNP Pharmacy:   [facility], [place] [address] Phone: [phone] Fax: [phone]     Social Determinants of Health (SDOH) Interventions    Readmission Risk Interventions Readmission Risk Prevention Plan 03/17/2019 02/26/2019 11/10/2018  Transportation Screening Complete Complete Complete  PCP or Specialist Appt within 3-5 Days - - Complete  HRI or [place] - - Complete  Social Work Consult for [place] Planning/Counseling - - Complete  Palliative Care Screening - - Not Applicable  Medication Review Press photographer) Complete Complete Complete  PCP or Specialist appointment within 3-5 days of discharge Complete - -  [place] or Home Care Consult Complete - -  SW Recovery Care/Counseling Consult Complete Complete -  Palliative Care Screening Not Applicable Not Applicable -  [place] Not Applicable Not Applicable -  Some recent data might be hidden

## 2020-03-31 NOTE — Progress Notes (Signed)
[facility] Progress Note  CC:  IDA  Subjective: She complains of fatigue. Mild nausea. No vomiting. No abdominal pain. She passed a small black stool yesterday evening. No BM or melena today. She tolerated soft food for breakfast. Appetite decreased. Daughter at the bedside.   Objective:   S/P small bowel enteroscopy 03/30/2020: Normal esophagus - A few gastric polyps. - Normal mucosa was found in the entire stomach. - A single non-bleeding angioectasia in the duodenum. Treated with argon plasma coagulation (APC). - Two angioectasias in the jejunum, one which was actively bleeding. Treated with argon plasma coagulation (APC). - No specimens collected.  Vital signs in last 24 hours: Temp:  [97.8 F (36.6 C)-98.6 F (37 C)] 97.8 F (36.6 C) (11/26 0543) Pulse Rate:  [61-66] 61 (11/26 0543) Resp:  [16-20] 16 (11/26 0543) BP: (103-121)/(46-65) 111/46 (11/26 0543) SpO2:  [90 %-98 %] 94 % (11/26 0543) Last BM Date: 03/28/20 General:   Alert fatigued appearing female in NAD. Sitting at the bedside.  Heart: Irregular rhythm. Systolic murmur.  Pulm:  Breath sounds clear throughout.  Abdomen: Soft, nondistended. Mild tenderness above the umbilicus. + BS x 4 quads.  Extremities:  Without edema. Neurologic:  Alert and  oriented x4;  grossly normal neurologically. Psych:  Alert and cooperative. Normal mood and affect.  Intake/Output from previous day: 11/25 0701 - 11/26 0700 In: 640 [P.O.:540; I.V.:100] Out: -  Intake/Output this shift: No intake/output data recorded.  Lab Results: Recent Labs    03/29/20 0732 03/30/20 0217 03/31/20 0057  WBC 4.8 5.6 6.0  HGB 7.4* 7.3* 7.6*  HCT 24.3* 24.7* 25.8*  PLT 196 214 241   BMET Recent Labs    03/29/20 0214 03/30/20 0217 03/31/20 0057  NA 134* 134* 135  K 4.2 4.3 4.0  CL 96* 98 97*  CO2 25 28 25   GLUCOSE 101* 107* 109*  BUN 62* 60* 48*  CREATININE 1.92* 1.71* 1.73*  CALCIUM 8.2* 8.7* 8.8*   LFT No  results for input(s): PROT, ALBUMIN, AST, ALT, ALKPHOS, BILITOT, BILIDIR, IBILI in the last 72 hours. PT/INR Recent Labs    03/30/20 0217 03/31/20 0057  LABPROT 16.4* 16.3*  INR 1.4* 1.4*   Hepatitis Panel No results for input(s): HEPBSAG, HCVAB, HEPAIGM, HEPBIGM in the last 72 hours.  EEG  Result Date: 03/30/2020 Lora Havens, MD     03/30/2020  8:39 AM Patient Name: [name] MRN: [id number] Epilepsy Attending: Lora Havens Referring Physician/Provider: Dr Donnetta Simpers Date: 03/29/2020 Duration: 24.11 minutes Patient history: 86 year old female with syncopal episode with a prodrome of behavioral arrest.  EEG to evaluate for seizures. Level of alertness: Awake, drowsy AEDs during EEG study: None Technical aspects: This EEG study was done with scalp electrodes positioned according to the 10-20 International system of electrode placement. Electrical activity was acquired at a sampling rate of 500Hz  and reviewed with a high frequency filter of 70Hz  and a low frequency filter of 1Hz . EEG data were recorded continuously and digitally stored. Description: The posterior dominant rhythm consists of 9-10 Hz activity of moderate voltage (25-35 uV) seen predominantly in posterior head regions, symmetric and reactive to eye opening and eye closing. Drowsiness was characterized by attenuation of the posterior background rhythm.  Hyperventilation and photic stimulation were not performed.   IMPRESSION: This study is within normal limits. No seizures or epileptiform discharges were seen throughout the recording. [name]    Assessment / Plan:  56. 86 year old  female with a history of IDA admitted to the hospital UGI bleed/melnea. Admission Hg 6.2. (baseline Hg 8.2). Transfused 2 units of PRBCs. S/P small bowel enteroscopy 11/15 showed a single nonbleeding AVM in the duodenum and 2 AVMs in the jejunum, one which was actively bleeding treated with APC. A few gastric polyps were noted.  Hg 7.3 -> 7.6. She passed a small formed black stool yesterday evening.  No BM or melena today.  Ok to start [place] to discharge home today -Our office will contact the patient for repeat CBC in 1 week -Follow up in GI clinic PRN  2. Atrial fib. INR 1.4. Eliquis started today.   3. Cardiomyopathy. Diastolic CHF. Diuretics on hold.   4. History of renal cancer s/p right nephrectomy.   5. AKI , improving    Principal Problem:   Symptomatic anemia Active Problems:   Mixed hyperlipidemia   Permanent atrial fibrillation   COPD (chronic obstructive pulmonary disease) (HCC)   Chronic kidney disease, stage 3b (HCC)   Acute on chronic diastolic CHF (congestive heart failure) (HCC)   Supratherapeutic INR   Hypothyroidism   Palliative care by specialist   Goals of care, counseling/discussion   GI bleed   AVM (arteriovenous malformation) of small bowel, acquired with hemorrhage   Gastric polyps     LOS: 3 days   [name]  03/31/2020, 10:37 AM

## 2020-04-02 ENCOUNTER — Encounter (HOSPITAL_COMMUNITY): Payer: Self-pay | Admitting: Gastroenterology

## 2020-04-03 ENCOUNTER — Other Ambulatory Visit: Payer: Self-pay

## 2020-04-03 ENCOUNTER — Telehealth: Payer: Self-pay

## 2020-04-03 DIAGNOSIS — D509 Iron deficiency anemia, unspecified: Secondary | ICD-10-CM

## 2020-04-03 NOTE — Telephone Encounter (Signed)
Order placed. No answer on the phone.

## 2020-04-03 NOTE — Telephone Encounter (Signed)
Transition Care Management Follow-up Telephone Call  Date of discharge and from where: [facility]  How have you been since you were released from the hospital? Patient states that she is still very weak and fatigue.   Any questions or concerns? Yes, Patient wants to know if provider will provide a standing order to [facility] to obtain regular hemoglobin levels on her when the nurse comes to her home.   Items Reviewed:  Did the pt receive and understand the discharge instructions provided? Yes   Medications obtained and verified? Yes   Other? No   Any new allergies since your discharge? No   Dietary orders reviewed? Yes  Do you have support at home? Yes   Home Care and Equipment/Supplies: Were home health services ordered? yes If so, what is the name of the agency? [facility]  Has the agency set up a time to come to the patient's home? no Were any new equipment or medical supplies ordered?  No What is the name of the medical supply agency? N/A Were you able to get the supplies/equipment? not applicable Do you have any questions related to the use of the equipment or supplies? No  Functional Questionnaire: (I = Independent and D = Dependent) ADLs: I  Bathing/Dressing- I  Meal Prep- I  Eating- I  Maintaining continence- I  Transferring/Ambulation- I  Managing Meds- I  Follow up appointments reviewed:   PCP Hospital f/u appt confirmed? Yes  Scheduled to see Clarene Reamer, NP on 04/14/2020 @ 2:30 pm.  [facility] f/u appt confirmed? Yes  Scheduled to see cardiology   Are transportation arrangements needed? No   If their condition worsens, is the pt aware to call PCP or go to the Emergency Dept.? Yes  Was the patient provided with contact information for the PCP's office or ED? Yes  Was to pt encouraged to call back with questions or concerns? Yes

## 2020-04-03 NOTE — Telephone Encounter (Signed)
Noted  

## 2020-04-04 ENCOUNTER — Encounter (HOSPITAL_COMMUNITY): Payer: PPO

## 2020-04-04 LAB — TRANSFUSION REACTION
DAT C3: NEGATIVE
Post RXN DAT IgG: NEGATIVE

## 2020-04-05 DIAGNOSIS — I482 Chronic atrial fibrillation, unspecified: Secondary | ICD-10-CM | POA: Diagnosis not present

## 2020-04-05 DIAGNOSIS — I130 Hypertensive heart and chronic kidney disease with heart failure and stage 1 through stage 4 chronic kidney disease, or unspecified chronic kidney disease: Secondary | ICD-10-CM | POA: Diagnosis not present

## 2020-04-05 DIAGNOSIS — J449 Chronic obstructive pulmonary disease, unspecified: Secondary | ICD-10-CM | POA: Diagnosis not present

## 2020-04-05 DIAGNOSIS — Z7901 Long term (current) use of anticoagulants: Secondary | ICD-10-CM | POA: Diagnosis not present

## 2020-04-05 DIAGNOSIS — E538 Deficiency of other specified B group vitamins: Secondary | ICD-10-CM | POA: Diagnosis not present

## 2020-04-05 DIAGNOSIS — N184 Chronic kidney disease, stage 4 (severe): Secondary | ICD-10-CM | POA: Diagnosis not present

## 2020-04-05 DIAGNOSIS — Z5181 Encounter for therapeutic drug level monitoring: Secondary | ICD-10-CM | POA: Diagnosis not present

## 2020-04-05 DIAGNOSIS — E039 Hypothyroidism, unspecified: Secondary | ICD-10-CM | POA: Diagnosis not present

## 2020-04-05 DIAGNOSIS — D509 Iron deficiency anemia, unspecified: Secondary | ICD-10-CM | POA: Diagnosis not present

## 2020-04-05 DIAGNOSIS — I5032 Chronic diastolic (congestive) heart failure: Secondary | ICD-10-CM | POA: Diagnosis not present

## 2020-04-05 DIAGNOSIS — F329 Major depressive disorder, single episode, unspecified: Secondary | ICD-10-CM | POA: Diagnosis not present

## 2020-04-05 DIAGNOSIS — N390 Urinary tract infection, site not specified: Secondary | ICD-10-CM | POA: Diagnosis not present

## 2020-04-05 DIAGNOSIS — J9611 Chronic respiratory failure with hypoxia: Secondary | ICD-10-CM | POA: Diagnosis not present

## 2020-04-05 DIAGNOSIS — F4322 Adjustment disorder with anxiety: Secondary | ICD-10-CM | POA: Diagnosis not present

## 2020-04-05 DIAGNOSIS — I421 Obstructive hypertrophic cardiomyopathy: Secondary | ICD-10-CM | POA: Diagnosis not present

## 2020-04-05 DIAGNOSIS — N3941 Urge incontinence: Secondary | ICD-10-CM | POA: Diagnosis not present

## 2020-04-05 DIAGNOSIS — I2510 Atherosclerotic heart disease of native coronary artery without angina pectoris: Secondary | ICD-10-CM | POA: Diagnosis not present

## 2020-04-05 DIAGNOSIS — Z95810 Presence of automatic (implantable) cardiac defibrillator: Secondary | ICD-10-CM | POA: Diagnosis not present

## 2020-04-05 DIAGNOSIS — E1122 Type 2 diabetes mellitus with diabetic chronic kidney disease: Secondary | ICD-10-CM | POA: Diagnosis not present

## 2020-04-05 DIAGNOSIS — I272 Pulmonary hypertension, unspecified: Secondary | ICD-10-CM | POA: Diagnosis not present

## 2020-04-05 DIAGNOSIS — M19042 Primary osteoarthritis, left hand: Secondary | ICD-10-CM | POA: Diagnosis not present

## 2020-04-05 DIAGNOSIS — M19041 Primary osteoarthritis, right hand: Secondary | ICD-10-CM | POA: Diagnosis not present

## 2020-04-05 DIAGNOSIS — E782 Mixed hyperlipidemia: Secondary | ICD-10-CM | POA: Diagnosis not present

## 2020-04-05 DIAGNOSIS — E1151 Type 2 diabetes mellitus with diabetic peripheral angiopathy without gangrene: Secondary | ICD-10-CM | POA: Diagnosis not present

## 2020-04-05 DIAGNOSIS — E559 Vitamin D deficiency, unspecified: Secondary | ICD-10-CM | POA: Diagnosis not present

## 2020-04-05 NOTE — Telephone Encounter (Signed)
Spoke with the patient. She will be visited in the home today by a home health nurse. Patient is home bound. Called and left a message with [facility]. Requested a return call about getting the CBC done in 1 week.

## 2020-04-05 NOTE — Telephone Encounter (Signed)
Spoke with the nurse case manager, Rich Reining, RN Her phone is [phone] She will put in the order for the CBC with Diff to be done 1 week.  Results to be faxed to [phone] Orders placed in Dr Vivia Ewing name Future orders can be faxed to [facility] [phone].

## 2020-04-06 ENCOUNTER — Ambulatory Visit: Payer: PPO

## 2020-04-06 NOTE — Anesthesia Postprocedure Evaluation (Signed)
Anesthesia Post Note  Patient: [name]  Procedure(s) Performed: ENTEROSCOPY (N/A ) HOT HEMOSTASIS (ARGON PLASMA COAGULATION/BICAP) (N/A )     Patient location during evaluation: Endoscopy Anesthesia Type: MAC Level of consciousness: awake and alert Pain management: pain level controlled Vital Signs Assessment: post-procedure vital signs reviewed and stable Respiratory status: spontaneous breathing, nonlabored ventilation, respiratory function stable and patient connected to nasal cannula oxygen Cardiovascular status: stable and blood pressure returned to baseline Postop Assessment: no apparent nausea or vomiting Anesthetic complications: no   No complications documented.  Last Vitals:  Vitals:   03/30/20 2156 03/31/20 0543  BP: 103/60 (!) 111/46  Pulse: 66 61  Resp: 20 16  Temp: 37 C 36.6 C  SpO2: 90% 94%    Last Pain:  Vitals:   03/31/20 1100  TempSrc:   PainSc: 0-No pain                 Evianna Chandran

## 2020-04-07 ENCOUNTER — Other Ambulatory Visit (HOSPITAL_COMMUNITY): Payer: Self-pay | Admitting: *Deleted

## 2020-04-07 ENCOUNTER — Inpatient Hospital Stay: Payer: PPO | Attending: Oncology

## 2020-04-07 ENCOUNTER — Telehealth (HOSPITAL_COMMUNITY): Payer: Self-pay | Admitting: *Deleted

## 2020-04-07 MED ORDER — TORSEMIDE 20 MG PO TABS: 20.0000 mg | ORAL_TABLET | ORAL | 0 refills | Status: DC | PRN

## 2020-04-07 NOTE — Telephone Encounter (Signed)
Pt called c/o swelling, nausea, and shortness of breath. Pt taking all medications as prescribed. Cardiomems goal 17 patient reading today 20. Per Amy Clegg,NP take an extra 20 mg of torsemide today and tomorrow and keep appointment next week if symptoms persist or worsen please report to the emergency room for evaluation. Pt aware and agreeable with plan.

## 2020-04-10 ENCOUNTER — Telehealth (HOSPITAL_COMMUNITY): Payer: Self-pay | Admitting: Adult Health

## 2020-04-10 NOTE — Telephone Encounter (Signed)
Order for repeat bmet sent to [facility] at [phone]

## 2020-04-10 NOTE — Telephone Encounter (Signed)
Cardiomems reading elevated.   Called an instructed to take metolazone as recommended.   We will ask [facility] to check BMET later this week and fax results.   Treonna Klee NP-C  4:04 PM

## 2020-04-11 NOTE — Progress Notes (Incomplete)
Advanced Heart Failure Clinic Note   Primary Physician: Christiana Pellant, FNP PCP-Cardiologist:  Virl Axe, MD HF Cardiologist: Dr. [name]   86 y.o. with history of permanent atrial fibrillation, chronic diastolic CHF, CKD stage 3, smoking/prior COPD, renal cell CA s/p right nephrectomy.  She has a prior history of HFrEF with nonischemic cardiomyoapthy and had a Medtronic ICD placed.  However, subsequently her EF has recovered.  More recently, she has had symptomatic HFpEF.    She has had dyspnea and edema for "a long time," worse starting 1/20. In 6/20, she had left hip ORIF after mechanical fall.  Echo in 6/20 showed EF 60-65%, mild RV dilation with normal systolic function, PASP 93 mmHg.  After she got home from the hospital stay post-ORIF, she noted marked peripheral edema.  She was short of breath walking short distances.  +orthopnea.  She came back to the hospital on 11/04/18 and was admitted with volume overload.  She was diuresed aggressively and lost about 22 lbs.  RHC showed pulmonary venous hypertension.  PCWP tracing had prominent v-waves in absence of significant MR, suggesting significant diastolic dysfunction.   She has been noted to have significant Fe deficiency anemia, but FOBT was negative during 7/20 hospitalization.   She was admitted in 11/20 with symptomatic anemia, GI workup concerning for ischemic bowel.  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 Doren Custard with [facility] and conservative management recommended.  Capsule endoscopy showed no definite source of bleeding.  She was volume overloaded due to significant RV dysfunction and diuresed, she also developed AKI on CKD stage 3.      She was admitted 03/26/20-03/31/20 for weakness and SOB. She came to the ER for evaluation.  HS-TnI 28=>26.  Hgb 6.2, INR 4.7 and FOBT+, she received 2 units PRBCs, Lasix 80 mg IV, & 2.5mg  Vit K  INR was 4.7. Creatinine mildly higher than prior at 1.64. At the time of  discharge, she had received total of 3 Units PRBCs & 5 mg Vit K during hospitalization. CT head unremarkable after syncopal event after receiving 3rd unit PRBCs, likely transfusion-reaction, per Cardiology. EEG completed. No seizures or epileptiform discharges were seen throughout the recording.  GIperformedpush enteroscopy on 03/30/2020 revealing 1 bleeding AVM in the small bowel as well as 2 non-bleeding AVM's in the jejunum, treated with argon plasma coagulation (APC). Started on Eliquis as recommended by Cardiology. Discharge weight 151 lbs.   She returns for post hospitalization follow up.  Breathing is stable, short of breath walking longer distances or up inclines.  No orthopnea/PND.  No chest pain.  She is "dizzy all the time."  Further questioning suggests that this seems to be more of a balance issue than lightheadedness, orthostatics done today were negative.  Weight is up   Labs (7/20): K 3.7, creatinine 1.44 => 1.33 Labs (12/20): pro-BNP 431, K 4.4, creatinine 1.24, hgb 8.2 Labs (10/21): K 4, creatinine 1.83 Labs 11/21): K 4, creatinine 1.73  Medtronic device interrogation:  ECG:   Cardiomems: PADP   PMH: 1. COPD 2. Renal cell carcinoma: S/p right kidney resection.  3. CKD stage 3 4. Fe deficiency anemia 5. Atrial fibrillation: Permanent. She has failed amiodarone, Tikosyn, and flecainide as well as multiple cardioversions. 6. Chronic diastolic CHF: She has history of prior HFrEF with nonischemic cardiomyopathy.  She has a Medtronic ICD.   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  - More recently, EF has been in normal range but she  has had diastolic CHF.  - Echo (7/00): EF 60-65%, mildly dilated RV with PASP 93 mmHg, mild-moderate TR. - Echo (11/21): EF 65-70%, mod LVH, mildly reduced RV, severe RAE, severe TR - RHC (7/20): mean RA 11, PA 64/18 mean 39, mean PCWP 24 with v waves to 41, CI 4.21, PVR 1.9 WU.  - Cardiomems placement - RHC (11/20): mean RA 13, PA 63/23 mean 25  with v waves to 47 (MR only mild by echo), mean PCWP 25, CI 3.18, PVR 2.6 WU 7. Hyperlipidemia 8. Carotid stenosis: Carotid dopplers (1/74) with 94-49% LICA stenosis.   9. Possible ischemic bowel in 11/20 10. 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 Conservative management per Dr. Doren Custard.   Social History   Socioeconomic History  . Marital status: Widowed    Spouse name: Not on file  . Number of children: Not on file  . Years of education: Not on file  . Highest education level: Not on file  Occupational History  . Occupation: Retired    Fish farm manager: RETIRED  Tobacco Use  . Smoking status: Former Smoker    Packs/day: 0.50    Years: 40.00    Pack years: 20.00    Types: Cigarettes    Quit date: 05/06/2001    Years since quitting: 18.9  . Smokeless tobacco: Never Used  Vaping Use  . Vaping Use: Never used  Substance and Sexual Activity  . Alcohol use: No    Alcohol/week: 0.0 standard drinks  . Drug use: No  . Sexual activity: Never  Other Topics Concern  . 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   ALCOHOL USE-NO   RETIRED   FORMER TOBACCO USE.....1/2 PACK X 47 YRS UNTIL 2003   Would desire CPR   Social Determinants of Health   Financial Resource Strain:   . Difficulty of Paying Living Expenses: Not on file  Food Insecurity:   . Worried About Charity fundraiser in the Last Year: Not on file  . Ran Out of Food in the Last Year: Not on file  Transportation Needs:   . Lack of Transportation (Medical): Not on file  . Lack of Transportation (Non-Medical): Not on file  Physical Activity:   . Days of Exercise per Week: Not on file  . Minutes of Exercise per Session: Not on file  Stress:   . Feeling of Stress : Not on file  Social Connections:   . Frequency of Communication  with Friends and Family: Not on file  . Frequency of Social Gatherings with Friends and Family: Not on file  . Attends Religious Services: Not on file  . Active Member of Clubs or Organizations: Not on file  . Attends Archivist Meetings: Not on file  . Marital Status: Not on file  Intimate Partner Violence:   . Fear of Current or Ex-Partner: Not on file  . Emotionally Abused: Not on file  . Physically Abused: Not on file  . Sexually Abused: Not on file   Family History  Problem Relation Age of Onset  . Heart failure Mother   . Breast cancer Maternal Aunt 82  . Breast cancer Cousin   . Breast cancer Other   . Colon cancer Neg Hx    ROS: All systems reviewed  and negative except as per HPI.   Current Outpatient Medications  Medication Sig Dispense Refill  . acetaminophen (TYLENOL) 500 MG tablet Take 500-1,000 mg by mouth every 8 (eight) hours as needed for mild pain or headache.     Marland Kitchen apixaban (ELIQUIS) 2.5 MG TABS tablet Take 1 tablet (2.5 mg total) by mouth 2 (two) times daily. Dispense starter pack 60 tablet 0  . cetirizine (ZYRTEC) 10 MG tablet Take 10 mg by mouth daily.     . Cholecalciferol (VITAMIN D) 50 MCG (2000 UT) CAPS Take 4,000 Units by mouth daily.    . Cranberry (AZO CRANBERRY GUMMIES) 500 MG CHEW Chew 2,000 mg by mouth daily.    . Cyanocobalamin (B-12 PO) Take 1 tablet by mouth daily.     Marland Kitchen ezetimibe (ZETIA) 10 MG tablet Take 1 tablet (10 mg total) by mouth daily. 30 tablet 11  . ferrous sulfate 325 (65 FE) MG tablet Take 1 tablet (325 mg total) by mouth 2 (two) times daily with a meal. 60 tablet 0  . fluticasone (FLONASE) 50 MCG/ACT nasal spray Place 2 sprays into both nostrils daily as needed for allergies or rhinitis. 16 g 6  . levothyroxine (SYNTHROID) 25 MCG tablet TAKE 1 TABLET BY MOUTH ONCE DAILY BEFOREBREAKFAST (Patient taking differently: Take 25 mcg by mouth daily before breakfast. ) 30 tablet 3  . magnesium oxide (MAG-OX) 400 MG tablet Take 1  tablet (400 mg total) by mouth daily. 30 tablet 2  . metolazone (ZAROXOLYN) 2.5 MG tablet Take 1 tablet (2.5 mg total) by mouth 2 (two) times a week. On mondays and thursdays 8 tablet 3  . pantoprazole (PROTONIX) 40 MG tablet Take 1 tablet (40 mg total) by mouth daily at 6 (six) AM. 30 tablet 0  . potassium chloride SA (KLOR-CON) 20 MEQ tablet Take 2 tablets (40 mEq total) by mouth 2 (two) times daily. 1 extra tab on metolazone days 90 tablet 3  . rosuvastatin (CRESTOR) 40 MG tablet Take 1 tablet (40 mg total) by mouth daily. 90 tablet 3  . torsemide (DEMADEX) 100 MG tablet Take 1 tablet (100 mg total) by mouth 2 (two) times daily. 180 tablet 3  . torsemide (DEMADEX) 20 MG tablet Take 1 tablet (20 mg total) by mouth as needed. 10 tablet 0  . vitamin C (ASCORBIC ACID) 250 MG tablet Take 500 mg by mouth daily.     No current facility-administered medications for this visit.   Facility-Administered Medications Ordered in Other Visits  Medication Dose Route Frequency Provider Last Rate Last Admin  . alteplase (CATHFLO ACTIVASE) injection 2 mg  2 mg Intracatheter Once PRN Sindy Guadeloupe, MD      . epoetin alfa-epbx (RETACRIT) injection 40,000 Units  40,000 Units Subcutaneous Q30 days Sindy Guadeloupe, MD   40,000 Units at 03/23/20 1201  . heparin lock flush 100 unit/mL  500 Units Intracatheter Once PRN Sindy Guadeloupe, MD      . heparin lock flush 100 unit/mL  250 Units Intracatheter Once PRN Sindy Guadeloupe, MD      . sodium chloride flush (NS) 0.9 % injection 10 mL  10 mL Intracatheter Once PRN Sindy Guadeloupe, MD      . sodium chloride flush (NS) 0.9 % injection 3 mL  3 mL Intracatheter Once PRN Sindy Guadeloupe, MD       There were no vitals taken for this visit.   There were no vitals filed for this visit.  PHYSICAL EXAM General: Elderly, fatigued-appearing HEENT: Normal.  Neck: JVP 8 cm, no thyromegaly or thyroid nodule.  Lungs: Clear to auscultation bilaterally with normal respiratory effort.  CV: Nondisplaced PMI.  Heart irregular S1/S2, no S3/S4, 2/6 AS murmur RUSB. Trace ankle edema.  No carotid bruit.  Normal pedal pulses.  Abdomen: Soft, nontender, no hepatosplenomegaly, no distention.  Skin: Intact without lesions or rashes.  Neurologic: Alert and oriented x 3.  Psych: Normal affect. Extremities: No clubbing or cyanosis.    Assessment/Plan: 1. Chronic diastolic CHF: Echo in 2/75 showed EF 60-65%, mild RV dilation with normal systolic function, PASP 93 mmHg. She had remote nonischemic cardiomyopathy with recovery of EF, has Medtronic ICD. I suspect that there is significant RV dysfunction. Atrial fibrillation likely contributes, but this appears to be permanent now as she has failed multiple anti-arrhythmics and is reasonably rate-controlled. [place] 11/20 showed elevated filling pressures and pulmonary venous hypertension.  Prominent v-waves on PCWP tracing likely due to stiff ventricle/diastolic dysfunction as she had only mild MR on echo.  Echo 11/21 with EF 65-70%, mod LVH, mildly reduced RV, severe RAE, severe TR - She now has Cardiomems.  NYHA III, Volume status is up by Cardiomems and weight is up. - Continue torsemide 100 mg bid + 40 mEq KCl BID with extra tablet metolazone days  - Continue metolazone biw on Monday and Thursday.  - BMET today     2. Pulmonary hypertension: PA systolic pressure estimated 93 mmHg on last echo. Based on [place] 11/20, she has primarily pulmonary venous hypertension.   3. CKD Stage 3: hx of RCC s/p R nephrectomy.BMET today and again in 10 days.   - baseline creatinine~1.3-1.4 - creatinine 1.7 on discharge  4. Anemia: Fe deficiency. She denies overt bleeding. Enteroscopy on 03/30/2020 revealing 1 bleeding AVM in the small bowel as well as 2 non-bleeding AVM's in the jejunum treated with APC. - Continue ferous sulfate - CBC today  5. Atrial fibrillation: Permanent now. Rate control is reasonable. She has failed amiodarone, Tikosyn, and  flecainide as well as multiple cardioversions. On [place] PTA, held for GIB - Continue Eliquis  Follow Up  Rafael Bihari, [place], FNP-BC 04/12/2020

## 2020-04-12 ENCOUNTER — Telehealth (HOSPITAL_COMMUNITY): Payer: Self-pay | Admitting: Family Medicine

## 2020-04-12 ENCOUNTER — Encounter (HOSPITAL_COMMUNITY): Payer: PPO

## 2020-04-12 ENCOUNTER — Encounter: Payer: Self-pay | Admitting: Family Medicine

## 2020-04-12 DIAGNOSIS — Z7689 Persons encountering health services in other specified circumstances: Secondary | ICD-10-CM | POA: Diagnosis not present

## 2020-04-12 NOTE — Telephone Encounter (Signed)
Called and spoke with patient after she missed today's appt. Patient's SOB improved after taking metolazone Monday, Cardiomems 21 today. Still feels weak. [place] RN getting labs today. Will try and get her rescheduled in ~ 2 months.  Rafael Bihari, FNP-BC

## 2020-04-13 ENCOUNTER — Inpatient Hospital Stay: Payer: PPO | Attending: Oncology

## 2020-04-13 NOTE — Telephone Encounter (Signed)
appt card mailed to patient

## 2020-04-14 ENCOUNTER — Inpatient Hospital Stay: Payer: PPO | Attending: Oncology

## 2020-04-14 ENCOUNTER — Ambulatory Visit: Payer: PPO | Admitting: Family Medicine

## 2020-04-17 ENCOUNTER — Other Ambulatory Visit: Payer: Self-pay

## 2020-04-17 ENCOUNTER — Ambulatory Visit: Payer: PPO

## 2020-04-17 ENCOUNTER — Ambulatory Visit (INDEPENDENT_AMBULATORY_CARE_PROVIDER_SITE_OTHER): Payer: PPO | Admitting: Family Medicine

## 2020-04-17 ENCOUNTER — Telehealth: Payer: Self-pay | Admitting: Family Medicine

## 2020-04-17 ENCOUNTER — Encounter: Payer: Self-pay | Admitting: Family Medicine

## 2020-04-17 VITALS — BP 130/84 | HR 60 | Temp 97.4°F | Ht 66.0 in | Wt 150.8 lb

## 2020-04-17 DIAGNOSIS — Z87898 Personal history of other specified conditions: Secondary | ICD-10-CM | POA: Insufficient documentation

## 2020-04-17 DIAGNOSIS — I5032 Chronic diastolic (congestive) heart failure: Secondary | ICD-10-CM | POA: Diagnosis not present

## 2020-04-17 DIAGNOSIS — K625 Hemorrhage of anus and rectum: Secondary | ICD-10-CM | POA: Diagnosis not present

## 2020-04-17 DIAGNOSIS — R110 Nausea: Secondary | ICD-10-CM | POA: Diagnosis not present

## 2020-04-17 DIAGNOSIS — N184 Chronic kidney disease, stage 4 (severe): Secondary | ICD-10-CM | POA: Diagnosis not present

## 2020-04-17 LAB — CBC WITH DIFFERENTIAL/PLATELET
Basophils Absolute: 0 10*3/uL (ref 0.0–0.1)
Basophils Relative: 0.4 % (ref 0.0–3.0)
Eosinophils Absolute: 0 10*3/uL (ref 0.0–0.7)
Eosinophils Relative: 0.4 % (ref 0.0–5.0)
HCT: 29.1 % — ABNORMAL LOW (ref 36.0–46.0)
Hemoglobin: 8.7 g/dL — ABNORMAL LOW (ref 12.0–15.0)
Lymphocytes Relative: 17 % (ref 12.0–46.0)
Lymphs Abs: 0.5 10*3/uL — ABNORMAL LOW (ref 0.7–4.0)
MCHC: 29.9 g/dL — ABNORMAL LOW (ref 30.0–36.0)
MCV: 81 fl (ref 78.0–100.0)
Monocytes Absolute: 0.4 10*3/uL (ref 0.1–1.0)
Monocytes Relative: 12.5 % — ABNORMAL HIGH (ref 3.0–12.0)
Neutro Abs: 2.1 10*3/uL (ref 1.4–7.7)
Neutrophils Relative %: 69.7 % (ref 43.0–77.0)
Platelets: 225 10*3/uL (ref 150.0–400.0)
RBC: 3.59 Mil/uL — ABNORMAL LOW (ref 3.87–5.11)
RDW: 19.9 % — ABNORMAL HIGH (ref 11.5–15.5)
WBC: 3.1 10*3/uL — ABNORMAL LOW (ref 4.0–10.5)

## 2020-04-17 LAB — BASIC METABOLIC PANEL
BUN: 36 mg/dL — ABNORMAL HIGH (ref 6–23)
CO2: 34 mEq/L — ABNORMAL HIGH (ref 19–32)
Calcium: 9.2 mg/dL (ref 8.4–10.5)
Chloride: 96 mEq/L (ref 96–112)
Creatinine, Ser: 1.45 mg/dL — ABNORMAL HIGH (ref 0.40–1.20)
GFR: 33.22 mL/min — ABNORMAL LOW (ref >60.00)
Glucose, Bld: 91 mg/dL (ref 70–99)
Potassium: 3.5 mEq/L (ref 3.5–5.1)
Sodium: 138 mEq/L (ref 135–145)

## 2020-04-17 MED ORDER — ONDANSETRON HCL 4 MG PO TABS: 4.0000 mg | ORAL_TABLET | Freq: Three times a day (TID) | ORAL | 1 refills | Status: DC | PRN

## 2020-04-17 NOTE — Patient Instructions (Signed)
Good to see you today  Please schedule an 8 week follow up with Dr. Einar Pheasant

## 2020-04-17 NOTE — Progress Notes (Signed)
Subjective:    Patient ID: [name], female    DOB: 19-Feb-1934, 86 y.o.   MRN: [id number]  HPI Chief Complaint  Patient presents with  . Hospitalization Follow-up   This is an 86 yo female who presents today for hospital follow up.  She was admitted 11/21-26/21 for acute on chronic blood loss anemia, GI bleed due to AVM, supertherapeutic INR, syncope, afib, Diuretics were held in hospital. Weight today 149, goal 146. Has resumed her diuretics with improvement. Some times will take torsemide 20 mg prn in addition to 100 mg bid. She also takes metolazone twice a week. Fluid has been coming off. She continues to feel weak and tired. Was able to shower this morning, which was an improvement. Stools not as dark.  Was switched from coumadin from Eliquis, has noticed some aching in legs.  Home health drew blood last week, RN home visits. PT will start when she feels better. Lightheadedness has not been as bad, some today. Drove herself here today.  Has noticed some overall itching, no rash, no headache. This has been intermittent prior to hospitalization.  She had a blood transfusion reaction in the hospital, this will be added to her problem list.  Review of Systems Per HPI, has had some intermittent nausea.  Requests ondansetron which she had in the hospital with good relief    Objective:   Physical Exam Vitals reviewed.  Constitutional:      General: She is not in acute distress.    Appearance: She is normal weight. She is ill-appearing (chronically ill appearing). She is not toxic-appearing or diaphoretic.  HENT:     Head: Normocephalic and atraumatic.     Mouth/Throat:     Mouth: Mucous membranes are moist.  Eyes:     Conjunctiva/sclera: Conjunctivae normal.  Cardiovascular:     Rate and Rhythm: Normal rate. Rhythm irregular.     Heart sounds: Normal heart sounds.  Pulmonary:     Effort: Pulmonary effort is normal.     Breath sounds: Normal breath sounds.   Musculoskeletal:     Right lower leg: Edema (trace) present.     Left lower leg: Edema (trace) present.  Skin:    General: Skin is warm and dry.  Neurological:     Mental Status: She is alert and oriented to person, place, and time.  Psychiatric:        Mood and Affect: Mood normal.        Behavior: Behavior normal.        Thought Content: Thought content normal.        Judgment: Judgment normal.       BP 130/84 (BP Location: Left Arm, Patient Position: Sitting, Cuff Size: Normal)   Pulse 60   Temp (!) 97.4 F (36.3 C) (Temporal)   Ht 5\' 6"  (1.676 m)   Wt 150 lb 12.8 oz (68.4 kg)   SpO2 97%   BMI 24.34 kg/m  Wt Readings from Last 3 Encounters:  04/17/20 150 lb 12.8 oz (68.4 kg)  03/29/20 151 lb 3.8 oz (68.6 kg)  03/23/20 145 lb (65.8 kg)       Assessment & Plan:  1. RECTAL BLEEDING -She has not seen any more dark stools and no frank blood, she is taking iron supplements and was advised that these may turn her stool dark.  She knows to notify office if any additional rectal bleeding - CBC with Differential  2. Chronic kidney disease (CKD), stage IV (severe) (HCC) -  Basic Metabolic Panel  3. Nausea - ondansetron (ZOFRAN) 4 MG tablet; Take 1 tablet (4 mg total) by mouth every 8 (eight) hours as needed for nausea or vomiting.  Dispense: 20 tablet; Refill: 1  4. Chronic diastolic CHF (congestive heart failure) (HCC) -This is closely followed by the CHF clinic with daily weights and cardio mems readings  -Follow-up in 2 months with Dr. Einar Pheasant  This visit occurred during the SARS-CoV-2 public health emergency.  Safety protocols were in place, including screening questions prior to the visit, additional usage of staff PPE, and extensive cleaning of exam room while observing appropriate contact time as indicated for disinfecting solutions.    Clarene Reamer, FNP-BC  [facility], [facility]  04/22/2020 8:14 AM

## 2020-04-17 NOTE — Telephone Encounter (Signed)
Patient states that she had labs done with home health last week.  Calling to have results faxed to our office.   Gave them fax number [phone] and they will send these over now.   Patient had CBC w/Diff, EP4 (AP) testing done.

## 2020-04-20 ENCOUNTER — Other Ambulatory Visit (HOSPITAL_COMMUNITY): Payer: Self-pay | Admitting: Adult Health

## 2020-04-20 ENCOUNTER — Encounter: Payer: Self-pay | Admitting: Family Medicine

## 2020-04-20 ENCOUNTER — Telehealth: Payer: Self-pay

## 2020-04-20 DIAGNOSIS — F4322 Adjustment disorder with anxiety: Secondary | ICD-10-CM

## 2020-04-20 DIAGNOSIS — Z7901 Long term (current) use of anticoagulants: Secondary | ICD-10-CM

## 2020-04-20 DIAGNOSIS — E559 Vitamin D deficiency, unspecified: Secondary | ICD-10-CM

## 2020-04-20 DIAGNOSIS — N184 Chronic kidney disease, stage 4 (severe): Secondary | ICD-10-CM | POA: Diagnosis not present

## 2020-04-20 DIAGNOSIS — E1122 Type 2 diabetes mellitus with diabetic chronic kidney disease: Secondary | ICD-10-CM | POA: Diagnosis not present

## 2020-04-20 DIAGNOSIS — Z8781 Personal history of (healed) traumatic fracture: Secondary | ICD-10-CM

## 2020-04-20 DIAGNOSIS — D509 Iron deficiency anemia, unspecified: Secondary | ICD-10-CM | POA: Diagnosis not present

## 2020-04-20 DIAGNOSIS — N3941 Urge incontinence: Secondary | ICD-10-CM

## 2020-04-20 DIAGNOSIS — Z96643 Presence of artificial hip joint, bilateral: Secondary | ICD-10-CM

## 2020-04-20 DIAGNOSIS — F329 Major depressive disorder, single episode, unspecified: Secondary | ICD-10-CM

## 2020-04-20 DIAGNOSIS — I272 Pulmonary hypertension, unspecified: Secondary | ICD-10-CM

## 2020-04-20 DIAGNOSIS — E1151 Type 2 diabetes mellitus with diabetic peripheral angiopathy without gangrene: Secondary | ICD-10-CM | POA: Diagnosis not present

## 2020-04-20 DIAGNOSIS — Z90710 Acquired absence of both cervix and uterus: Secondary | ICD-10-CM

## 2020-04-20 DIAGNOSIS — I130 Hypertensive heart and chronic kidney disease with heart failure and stage 1 through stage 4 chronic kidney disease, or unspecified chronic kidney disease: Secondary | ICD-10-CM | POA: Diagnosis not present

## 2020-04-20 DIAGNOSIS — Z9181 History of falling: Secondary | ICD-10-CM

## 2020-04-20 DIAGNOSIS — Z8616 Personal history of COVID-19: Secondary | ICD-10-CM

## 2020-04-20 DIAGNOSIS — Z8673 Personal history of transient ischemic attack (TIA), and cerebral infarction without residual deficits: Secondary | ICD-10-CM

## 2020-04-20 DIAGNOSIS — J9611 Chronic respiratory failure with hypoxia: Secondary | ICD-10-CM

## 2020-04-20 DIAGNOSIS — E538 Deficiency of other specified B group vitamins: Secondary | ICD-10-CM

## 2020-04-20 DIAGNOSIS — N390 Urinary tract infection, site not specified: Secondary | ICD-10-CM | POA: Diagnosis not present

## 2020-04-20 DIAGNOSIS — Z95810 Presence of automatic (implantable) cardiac defibrillator: Secondary | ICD-10-CM

## 2020-04-20 DIAGNOSIS — Z85828 Personal history of other malignant neoplasm of skin: Secondary | ICD-10-CM

## 2020-04-20 DIAGNOSIS — Z5181 Encounter for therapeutic drug level monitoring: Secondary | ICD-10-CM

## 2020-04-20 DIAGNOSIS — J449 Chronic obstructive pulmonary disease, unspecified: Secondary | ICD-10-CM | POA: Diagnosis not present

## 2020-04-20 DIAGNOSIS — M19042 Primary osteoarthritis, left hand: Secondary | ICD-10-CM

## 2020-04-20 DIAGNOSIS — E782 Mixed hyperlipidemia: Secondary | ICD-10-CM

## 2020-04-20 DIAGNOSIS — E039 Hypothyroidism, unspecified: Secondary | ICD-10-CM

## 2020-04-20 DIAGNOSIS — I421 Obstructive hypertrophic cardiomyopathy: Secondary | ICD-10-CM | POA: Diagnosis not present

## 2020-04-20 DIAGNOSIS — I5032 Chronic diastolic (congestive) heart failure: Secondary | ICD-10-CM | POA: Diagnosis not present

## 2020-04-20 DIAGNOSIS — M19041 Primary osteoarthritis, right hand: Secondary | ICD-10-CM | POA: Diagnosis not present

## 2020-04-20 DIAGNOSIS — I482 Chronic atrial fibrillation, unspecified: Secondary | ICD-10-CM | POA: Diagnosis not present

## 2020-04-20 DIAGNOSIS — I2510 Atherosclerotic heart disease of native coronary artery without angina pectoris: Secondary | ICD-10-CM | POA: Diagnosis not present

## 2020-04-20 NOTE — Telephone Encounter (Signed)
Noted. Agree with labs.

## 2020-04-20 NOTE — Telephone Encounter (Signed)
[name] Mola RN and clinical mgr with [facility] left v/m; [name] scheduled visit with pt this afternoon and while talking with pt [name] complained with burning and frequency of urine and foul smell to urine. Elizabeth request verbal order for U/A and C&S. Sending note to Glenda Chroman FNP who is out of office today and Dr Einar Pheasant who is in office.

## 2020-04-20 NOTE — Telephone Encounter (Signed)
OK for UA and C&S

## 2020-04-20 NOTE — Telephone Encounter (Signed)
Left detailed VM for [name] with [name], giving verbal orders for UA and C&S, per Dr. Einar Pheasant.

## 2020-04-21 NOTE — Telephone Encounter (Signed)
Please call patient and tell her that I am sorry she has been uncomfortable with her urination.  I have called the home health nurse regarding the results and and waiting to hear back.  In the meantime, I would like her to be sure to increase her fluid intake, avoid citrus and artificial sweeteners like diet soda.  She can take some over-the-counter AZO for burning.  As soon as I hear back from the nurse regarding her blood work I will call her, even if it is over the weekend.

## 2020-04-21 NOTE — Telephone Encounter (Signed)
Advised pt of PCP msg. Pt verbalized understanding and was very appreciative.

## 2020-04-21 NOTE — Telephone Encounter (Signed)
Contacted pt about other lab results and pt reported dysuria and frequency are still bad and she is wondering if any results are back yet. Checked faxes and no results back yet. Advised would need C&S to prescribe abx most of the time but a msg would be sent to PCP to let her know. Pt said her pharmacy is only open until 1pm on Saturdays and closed on Sundays. Advised a msg would be sent to PCP to see if she has any results from the urine culture.

## 2020-04-22 ENCOUNTER — Encounter: Payer: Self-pay | Admitting: Family Medicine

## 2020-04-24 ENCOUNTER — Telehealth: Payer: Self-pay | Admitting: *Deleted

## 2020-04-24 NOTE — Telephone Encounter (Signed)
Spoken to patient and notified Debbie's comments. Verbalized understanding.

## 2020-04-24 NOTE — Telephone Encounter (Signed)
Spoken to patient and notified Debbie's comments.   Patient stated that today, the irritation is not as bad as it was over the weekend.  Patient wanted Jackelyn Poling to know that over the weekend she had some drainage and blood when urinating. The irritation is bad only when she starts urination. There is some back pain but only when she moves around.

## 2020-04-24 NOTE — Telephone Encounter (Signed)
[name] with [facility] called stating that she had talked with you about the UA results. [name] stated that they got the results of the culture back which showed mixed which not able to grow anything. [name] stated that she is faxing the results over so that [name] can see the report.

## 2020-04-24 NOTE — Telephone Encounter (Signed)
Please call patient and ask her if it is possible that she is having some vaginal irritation? Especially with onset of irritation with initiating urine. I recommend she apply a protective barrier like Desitin/ or over the counter diaper cream. If she is having any vaginal discharge or bleeding, she should be evaluated in office for yeast or bacteria.

## 2020-04-24 NOTE — Telephone Encounter (Signed)
Please call patient and tell her that I received the urine culture results.  She does not have a bladder infection.  I am not sure if the irritation is related to her recent hospitalization.  Please ask her if it is doing better today?

## 2020-04-25 ENCOUNTER — Telehealth (HOSPITAL_COMMUNITY): Payer: Self-pay

## 2020-04-25 NOTE — Telephone Encounter (Signed)
Patient called stating that since switching from warfarin to Eliquis she has been experiencing terrible itching all over body including her scalp. She hasn't tried any OTC medications. Denies rashes,changes in [place] care. Please advise

## 2020-04-25 NOTE — Telephone Encounter (Signed)
Would ask Lauren to talk to her, if she thinks it is Eliquis could try switching to Xarelto.

## 2020-04-25 NOTE — Telephone Encounter (Signed)
Has she been on any new medications recently, more specifically antibiotics? Rashes from Eliquis are not very common, but can happen. Has the itching been going on for the past month? (she started Eliquis in Nov). Any exposure to new pets?

## 2020-04-25 NOTE — Telephone Encounter (Signed)
She states that it has been going on since November. No new abx or medications recently started and no exposure to pets

## 2020-04-26 NOTE — Telephone Encounter (Signed)
Spoke with Ander Purpura, will try switching to Xarelto 15 mg daily. Patient has not had her AM dose of Eliquis yet so I advised her to start Xarelto today. Creatinine stable. Advised her to take claritin or zyrtec for next few days to see if that helps with itching. If not, can try 1/2 tablet of benadryl. Counseled patient to try 2nd generation antihistamine first, as they typically do not make you drowsy.

## 2020-04-26 NOTE — Telephone Encounter (Signed)
I think we should go ahead and change her to Xarelto 15 mg daily to see if it helps the itching resolve. She can use benadryl for a few days while the Eliquis washes out of her system. If she does not experience any relief with the change to Xarelto, may have to consider changing her back to warfarin.

## 2020-04-26 NOTE — Telephone Encounter (Signed)
That sounds fine

## 2020-04-26 NOTE — Telephone Encounter (Signed)
See other telephone message 

## 2020-04-26 NOTE — Telephone Encounter (Signed)
Spoke with [name], will try switching to Xarelto 15 mg daily. Patient has not had her AM dose of Eliquis yet so I advised her to start Xarelto today. Creatinine stable. Advised her to take claritin or zyrtec for next few days to see if that helps with itching. If not, can try 1/2 tablet of benadryl. Counseled patient to try 2nd generation antihistamine first, as they typically do not make you drowsy.       Documentation    [place], [name] Bo, FNP  [name], [name] 41 minutes ago (9:41 AM)   [name], [name] Bo, FNP  [name], [name] 51 minutes ago (9:31 AM)   [name], [name] routed conversation to [name] Beck, FNP 9 days ago   [name], [name] 9 days ago       Patient states that she had labs done with home health last week.  Calling to have results faxed to our office.    Gave them fax number [phone] and they will send these over now.    Patient had CBC w/Diff, EP4 (AP) testing done.

## 2020-04-27 NOTE — Telephone Encounter (Signed)
Noted. Agree with recommendations.  

## 2020-04-27 NOTE — Telephone Encounter (Signed)
Elizabeth RN clinical mgr with [facility] left v/m that pt had requested elizabeth to call to FU on provider treating yeast infection. Per this phone note  Pt was advised to try protective barrier like Desitin or OTC diaper cream. If she is having any vaginal discharge or bleeding, she should be evaluated in office for yeast or bacteria. Left this info on secure v/m due to trying main # [phone] and could not speak with nurse so left this message that if pt needs to be seen on 05/01/20 call [facility] and if pt needs to be seen before 05/01/20 pt would need to go to [facility].

## 2020-05-02 ENCOUNTER — Ambulatory Visit (INDEPENDENT_AMBULATORY_CARE_PROVIDER_SITE_OTHER): Payer: PPO | Admitting: Internal Medicine

## 2020-05-02 ENCOUNTER — Other Ambulatory Visit: Payer: Self-pay

## 2020-05-02 ENCOUNTER — Encounter: Payer: Self-pay | Admitting: Internal Medicine

## 2020-05-02 VITALS — BP 124/72 | HR 65 | Temp 95.9°F | Wt 151.0 lb

## 2020-05-02 DIAGNOSIS — R300 Dysuria: Secondary | ICD-10-CM

## 2020-05-02 DIAGNOSIS — R3989 Other symptoms and signs involving the genitourinary system: Secondary | ICD-10-CM | POA: Diagnosis not present

## 2020-05-02 DIAGNOSIS — R109 Unspecified abdominal pain: Secondary | ICD-10-CM

## 2020-05-02 LAB — POC URINALSYSI DIPSTICK (AUTOMATED)
Bilirubin, UA: NEGATIVE
Glucose, UA: NEGATIVE
Ketones, UA: NEGATIVE
Nitrite, UA: NEGATIVE
Protein, UA: NEGATIVE
Spec Grav, UA: 1.015 (ref 1.010–1.025)
Urobilinogen, UA: 0.2 E.U./dL
pH, UA: 6 (ref 5.0–8.0)

## 2020-05-02 MED ORDER — CEPHALEXIN 250 MG PO CAPS: 250.0000 mg | ORAL_CAPSULE | Freq: Two times a day (BID) | ORAL | 0 refills | Status: DC

## 2020-05-02 NOTE — Progress Notes (Signed)
HPI  Pt presents to the clinic today with c/o bladder pressure and dysuria. This started 2 weeks ago.  She denies urgency, frequency or blood in urine.  She has some mixed incontinence.  She has had some right flank pain which has improved.  She has had some vaginal discharge but denies odor, or abnormal bleeding. She has not taken anything OTC for her symptoms. She has no history of kidney stones.   Review of Systems  Past Medical History:  Diagnosis Date  . Adjustment disorder with anxiety   . Anemia   . Anginal pain ([place])   . Arthritis    "some in my hands" (11/11/2012)  . Atrial fibrillation ([place])   . Automatic implantable cardioverter-defibrillator in situ   . Blood in stool   . Carotid artery stenosis 09/2007   a. 09/2007: 60-79% bilateral (stable); b. 10/2008: 40-59% R 60-79%   . Chest pain, unspecified   . Chronic airway obstruction, not elsewhere classified   . Chronic bronchitis ([place])    "get it some; not q year" (11/11/2012)  . Chronic diastolic CHF (congestive heart failure) ([place]) 06/04/2013  . Chronic kidney disease, unspecified   . Coronary artery disease    non-obstructive by 2006 cath  . Frequent UTI    "get them a couple times/yr" (11/11/2012)  . Hemorrhage of rectum and anus   . High cholesterol   . History of blood transfusion 04/2011   "after hip OR" (11/11/2012)  . Hx of renal cell cancer   . Hypertension 05/20/2011  . Hypertr obst cardiomyop   . Hypotension, unspecified    cardiac cath 2006..nonobstructive CAD 30-40s lesions.Marland KitchenETT 1/09 nondiagnostic due to poor HR response..Right Renal Cancer 2003  . Long term (current) use of anticoagulants   . Malignant neoplasm of kidney, except pelvis   . Osteoarthritis of right hip   . Other and unspecified coagulation defects   . PONV (postoperative nausea and vomiting)   . Renal cancer ([place]) 06/2001   Right  . Secondary cardiomyopathy, unspecified   . Sinus bradycardia   . Urge incontinence     Family History  Problem  Relation Age of Onset  . Heart failure Mother   . Breast cancer Maternal Aunt 82  . Breast cancer Cousin   . Breast cancer Other   . Colon cancer Neg Hx     Social History   Socioeconomic History  . Marital status: Widowed    Spouse name: Not on file  . Number of children: Not on file  . Years of education: Not on file  . Highest education level: Not on file  Occupational History  . Occupation: Retired    Fish farm manager: RETIRED  Tobacco Use  . Smoking status: Former Smoker    Packs/day: 0.50    Years: 40.00    Pack years: 20.00    Types: Cigarettes    Quit date: 05/06/2001    Years since quitting: 19.0  . Smokeless tobacco: Never Used  Vaping Use  . Vaping Use: Never used  Substance and Sexual Activity  . Alcohol use: No    Alcohol/week: 0.0 standard drinks  . Drug use: No  . Sexual activity: Never  Other Topics Concern  . 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   ALCOHOL USE-NO   RETIRED  FORMER TOBACCO USE.....1/2 PACK X 33 YRS UNTIL 2003   Would desire CPR   Social Determinants of Health   Financial Resource Strain: Not on file  Food Insecurity: Not on file  Transportation Needs: Not on file  Physical Activity: Not on file  Stress: Not on file  Social Connections: Not on file  Intimate Partner Violence: Not on file    Allergies  Allergen Reactions  . Dilaudid [Hydromorphone] Other (See Comments)    Excessive Somnolence- Required Narcan  . Fentanyl Other (See Comments)    Required Narcan when given with Dilaudid. Tolerated single dose when given during procedure 02/03/2019.   Marland Kitchen Codeine Nausea And Vomiting    Hallucinations   . Morphine And Related Nausea And Vomiting    Hallucinations   . Sulfonamide Derivatives Other (See Comments)    Dry mouth     Constitutional: Denies fever, malaise,  fatigue, headache or abrupt weight changes.   GU: Pt reports right flank pain, bladder pressure and pain with urination. Denies urgency, frequency, burning sensation, blood in urine, odor. Skin: Denies redness, rashes, lesions or ulcercations.   No other specific complaints in a complete review of systems (except as listed in HPI above).    Objective:   Physical Exam  BP 124/72   Pulse 65   Temp (!) 95.9 F (35.5 C) (Temporal)   Wt 151 lb (68.5 kg)   SpO2 97%   BMI 24.37 kg/m   Wt Readings from Last 3 Encounters:  04/17/20 150 lb 12.8 oz (68.4 kg)  03/29/20 151 lb 3.8 oz (68.6 kg)  03/23/20 145 lb (65.8 kg)    General: Appears her stated age, well developed, well nourished in NAD. Cardiovascular: Normal rate. Pulmonary/Chest: Normal effort. sounds. No respiratory distress. No wheezes, rales or ronchi noted.  Abdomen: Soft. Normal bowel sounds. No distention or masses noted.  Tender to palpation over the bladder area. No CVA tenderness.        Assessment & Plan:   Bladder Pressure, Right Flank Pain, Dysuria:  Urinalysis: 3+ leuks, 2+ blood Will send urine culture eRx sent if for Keflex 250 mg BID x 5 days OK to take AZO OTC Drink plenty of fluids  RTC as needed or if symptoms persist. Webb Silversmith, NP This visit occurred during the SARS-CoV-2 public health emergency.  Safety protocols were in place, including screening questions prior to the visit, additional usage of staff PPE, and extensive cleaning of exam room while observing appropriate contact time as indicated for disinfecting solutions.

## 2020-05-03 LAB — URINE CULTURE
MICRO NUMBER:: 11362066
Result:: NO GROWTH
SPECIMEN QUALITY:: ADEQUATE

## 2020-05-03 NOTE — Patient Instructions (Signed)
Urinary Tract Infection, Adult A urinary tract infection (UTI) is an infection of any part of the urinary tract. The urinary tract includes:  The kidneys.  The ureters.  The bladder.  The urethra. These organs make, store, and get rid of pee (urine) in the body. What are the causes? This is caused by germs (bacteria) in your genital area. These germs grow and cause swelling (inflammation) of your urinary tract. What increases the risk? You are more likely to develop this condition if:  You have a small, thin tube (catheter) to drain pee.  You cannot control when you pee or poop (incontinence).  You are female, and: ? You use these methods to prevent pregnancy:  A medicine that kills sperm (spermicide).  A device that blocks sperm (diaphragm). ? You have low levels of a female hormone (estrogen). ? You are pregnant.  You have genes that add to your risk.  You are sexually active.  You take antibiotic medicines.  You have trouble peeing because of: ? A prostate that is bigger than normal, if you are female. ? A blockage in the part of your body that drains pee from the bladder (urethra). ? A kidney stone. ? A nerve condition that affects your bladder (neurogenic bladder). ? Not getting enough to drink. ? Not peeing often enough.  You have other conditions, such as: ? Diabetes. ? A weak disease-fighting system (immune system). ? Sickle cell disease. ? Gout. ? Injury of the spine. What are the signs or symptoms? Symptoms of this condition include:  Needing to pee right away (urgently).  Peeing often.  Peeing small amounts often.  Pain or burning when peeing.  Blood in the pee.  Pee that smells bad or not like normal.  Trouble peeing.  Pee that is cloudy.  Fluid coming from the vagina, if you are female.  Pain in the belly or lower back. Other symptoms include:  Throwing up (vomiting).  No urge to eat.  Feeling mixed up (confused).  Being tired  and grouchy (irritable).  A fever.  Watery poop (diarrhea). How is this treated? This condition may be treated with:  Antibiotic medicine.  Other medicines.  Drinking enough water. Follow these instructions at home:  Medicines  Take over-the-counter and prescription medicines only as told by your doctor.  If you were prescribed an antibiotic medicine, take it as told by your doctor. Do not stop taking it even if you start to feel better. General instructions  Make sure you: ? Pee until your bladder is empty. ? Do not hold pee for a long time. ? Empty your bladder after sex. ? Wipe from front to back after pooping if you are a female. Use each tissue one time when you wipe.  Drink enough fluid to keep your pee pale yellow.  Keep all follow-up visits as told by your doctor. This is important. Contact a doctor if:  You do not get better after 1-2 days.  Your symptoms go away and then come back. Get help right away if:  You have very bad back pain.  You have very bad pain in your lower belly.  You have a fever.  You are sick to your stomach (nauseous).  You are throwing up. Summary  A urinary tract infection (UTI) is an infection of any part of the urinary tract.  This condition is caused by germs in your genital area.  There are many risk factors for a UTI. These include having a small, thin   tube to drain pee and not being able to control when you pee or poop.  Treatment includes antibiotic medicines for germs.  Drink enough fluid to keep your pee pale yellow. This information is not intended to replace advice given to you by your health care provider. Make sure you discuss any questions you have with your health care provider. Document Revised: 04/09/2018 Document Reviewed: 10/30/2017 [employer] Patient Education  2020 [employer]  

## 2020-05-04 ENCOUNTER — Inpatient Hospital Stay: Payer: PPO | Admitting: Oncology

## 2020-05-04 ENCOUNTER — Telehealth: Payer: Self-pay | Admitting: Oncology

## 2020-05-04 ENCOUNTER — Inpatient Hospital Stay: Payer: PPO

## 2020-05-04 ENCOUNTER — Telehealth: Payer: Self-pay | Admitting: *Deleted

## 2020-05-04 NOTE — Progress Notes (Deleted)
Hematology/Oncology Consult note [facility]  [phone] [phone]  Patient Care Team: Elby Beck, FNP as PCP - General (Nurse Practitioner) Deboraha Sprang, MD as PCP - Cardiology (Cardiology) Larey Dresser, MD as PCP - Advanced Heart Failure (Cardiology) Minna Merritts, MD as Consulting Physician (Cardiology) Deboraha Sprang, MD as Consulting Physician (Cardiology) [name], Dolores Lory, MD as Consulting Physician (Vascular Surgery) Debbora Dus, [facility] as Pharmacist (Pharmacist) Sindy Guadeloupe, MD as Consulting Physician (Hematology and Oncology)   Name of the patient: [name]  [id number]  07-10-1933   Date of visit: 05/04/20  Diagnosis- normocytic anemia possibly secondary to anemia of chronic kidney disease and anemia of chronic disease  Chief complaint/ Reason for visit-routine follow-up of anemia  Heme/Onc history: Patient is a 86 year old female with a past medical history significant for hypothyroidism, TIA, stage IV CKD, A. fib, congestive heart failure, COPD, AICD placement among other medical problems. She has been referred to me for anemia. Her most recent CBC from 12/15/2019 showed white count of 4, H&H of 8.9/27.6 with an MCV of 82 and a platelet count of 225. Looking back at her prior CBCs her hemoglobin was between 9-10 until July 2021 although looking back at her CBC for most of the last year her hemoglobin has been between 6-8. She has also received Feraheme by cardiology in the past in June 2021. She has had a complete GI work-up including EGD colonoscopy and capsule study by lab our GI in October 2020. Colonoscopy showed patchy moderate inflammation in the sigmoid colon possibly ischemic and chronic but may explain the anemia. Upper endoscopy showed mild gastritis but was otherwise normal. Capsule endoscopy was negative  No significant improvement in hemoglobin despite giving further doses of IV  iron.  Retacrit initiated in November 2021  Interval history- ***  ECOG PS- *** Pain scale- *** Opioid associated constipation- ***  Review of systems- Review of Systems  Constitutional: Negative for chills, fever, malaise/fatigue and weight loss.  HENT: Negative for congestion, ear discharge and nosebleeds.   Eyes: Negative for blurred vision.  Respiratory: Negative for cough, hemoptysis, sputum production, shortness of breath and wheezing.   Cardiovascular: Negative for chest pain, palpitations, orthopnea and claudication.  Gastrointestinal: Negative for abdominal pain, blood in stool, constipation, diarrhea, heartburn, melena, nausea and vomiting.  Genitourinary: Negative for dysuria, flank pain, frequency, hematuria and urgency.  Musculoskeletal: Negative for back pain, joint pain and myalgias.  Skin: Negative for rash.  Neurological: Negative for dizziness, tingling, focal weakness, seizures, weakness and headaches.  Endo/Heme/Allergies: Does not bruise/bleed easily.  Psychiatric/Behavioral: Negative for depression and suicidal ideas. The patient does not have insomnia.       Allergies  Allergen Reactions  . Dilaudid [Hydromorphone] Other (See Comments)    Excessive Somnolence- Required Narcan  . Fentanyl Other (See Comments)    Required Narcan when given with Dilaudid. Tolerated single dose when given during procedure 02/03/2019.   Marland Kitchen Codeine Nausea And Vomiting    Hallucinations   . Morphine And Related Nausea And Vomiting    Hallucinations   . Sulfonamide Derivatives Other (See Comments)    Dry mouth     Past Medical History:  Diagnosis Date  . Adjustment disorder with anxiety   . Anemia   . Anginal pain ([place])   . Arthritis    "some in my hands" (11/11/2012)  . Atrial fibrillation ([place])   . Automatic implantable cardioverter-defibrillator in situ   . Blood  in stool   . Carotid artery stenosis 09/2007   a. 09/2007: 60-79% bilateral (stable); b. 10/2008: 40-59% R  60-79%   . Chest pain, unspecified   . Chronic airway obstruction, not elsewhere classified   . Chronic bronchitis ([place])    "get it some; not q year" (11/11/2012)  . Chronic diastolic CHF (congestive heart failure) ([place]) 06/04/2013  . Chronic kidney disease, unspecified   . Coronary artery disease    non-obstructive by 2006 cath  . Frequent UTI    "get them a couple times/yr" (11/11/2012)  . Hemorrhage of rectum and anus   . High cholesterol   . History of blood transfusion 04/2011   "after hip OR" (11/11/2012)  . Hx of renal cell cancer   . Hypertension 05/20/2011  . Hypertr obst cardiomyop   . Hypotension, unspecified    cardiac cath 2006..nonobstructive CAD 30-40s lesions.Marland KitchenETT 1/09 nondiagnostic due to poor HR response..Right Renal Cancer 2003  . Long term (current) use of anticoagulants   . Malignant neoplasm of kidney, except pelvis   . Osteoarthritis of right hip   . Other and unspecified coagulation defects   . PONV (postoperative nausea and vomiting)   . Renal cancer ([place]) 06/2001   Right  . Secondary cardiomyopathy, unspecified   . Sinus bradycardia   . Urge incontinence      Past Surgical History:  Procedure Laterality Date  . ABDOMINAL HYSTERECTOMY  1975   for benign causes  . APPENDECTOMY    . BI-VENTRICULAR PACEMAKER UPGRADE  05/04/2010  . BIOPSY  02/28/2019   Procedure: BIOPSY;  Surgeon: Milus Banister, MD;  Location: [facility] ENDOSCOPY;  Service: Endoscopy;;  . CARDIAC CATHETERIZATION  2006  . CARDIOVERSION N/A 02/20/2018   Procedure: CARDIOVERSION;  Surgeon: Minna Merritts, MD;  Location: [place] ORS;  Service: Cardiovascular;  Laterality: N/A;  . CARDIOVERSION N/A 03/27/2018   Procedure: CARDIOVERSION;  Surgeon: Minna Merritts, MD;  Location: [facility] ORS;  Service: Cardiovascular;  Laterality: N/A;  . CATARACT EXTRACTION W/ INTRAOCULAR LENS  IMPLANT, BILATERAL  01/2006-02-2006  . CHOLECYSTECTOMY N/A 11/11/2012   Procedure: LAPAROSCOPIC CHOLECYSTECTOMY WITH  INTRAOPERATIVE CHOLANGIOGRAM;  Surgeon: Imogene Burn. Georgette Dover, MD;  Location: [place];  Service: General;  Laterality: N/A;  . COLONOSCOPY WITH PROPOFOL N/A 02/28/2019   Procedure: COLONOSCOPY WITH PROPOFOL;  Surgeon: Milus Banister, MD;  Location: [facility] ENDOSCOPY;  Service: Endoscopy;  Laterality: N/A;  . ENTEROSCOPY N/A 03/30/2020   Procedure: ENTEROSCOPY;  Surgeon: Lavena Bullion, DO;  Location: [place];  Service: Gastroenterology;  Laterality: N/A;  . EP IMPLANTABLE DEVICE N/A 02/21/2016   Procedure: ICD Generator Changeout;  Surgeon: Deboraha Sprang, MD;  Location: [place] CV LAB;  Service: Cardiovascular;  Laterality: N/A;  . ESOPHAGOGASTRODUODENOSCOPY (EGD) WITH PROPOFOL N/A 02/28/2019   Procedure: ESOPHAGOGASTRODUODENOSCOPY (EGD) WITH PROPOFOL;  Surgeon: Milus Banister, MD;  Location[facility] END[facility]COPY;  Service: Endoscopy;  Laterality: N/A;  . GIVENS CAPSULE STUDY N/A 03/15/2019   Procedure: GIVENS CAPSULE STUDY;  Surgeon: Thornton Park, MD;  Location: [place];  Service: Gastroenterology;  Laterality: N/A;  . HOT HEMOSTASIS N/A 03/30/2020   Procedure: HOT HEMOSTASIS (ARGON PLASMA COAGULATION/BICAP);  Surgeon: Lavena Bullion, DO;  Location: [facility]COPY;  Service: Gastroenterology;  Laterality: N/A;  . INSERT / REPLACE / REMOVE PACEMAKER  05-01-11   02-28-05-/05-04-10-ICD-MEDTRONIC MAXIMAL DR  . JOINT REPLACEMENT    . LAPAROSCOPIC CHOLECYSTECTOMY  11/11/2012  . LAPAROSCOPIC LYSIS OF ADHESIONS N/A 11/11/2012   Procedure: LAPAROSCOPIC LYSIS OF ADHESIONS;  Surgeon:  Imogene Burn. Georgette Dover, MD;  Location: [place];  Service: General;  Laterality: N/A;  . NEPHRECTOMY Right 06/2001    S/P RENAL CELL CANCER  . PRESSURE SENSOR/CARDIOMEMS N/A 02/03/2019   Procedure: PRESSURE SENSOR/CARDIOMEMS;  Surgeon: Larey Dresser, MD;  Location: [place] CV LAB;  Service: Cardiovascular;  Laterality: N/A;  . RIGHT HEART CATH N/A 11/09/2018   Procedure: RIGHT HEART CATH;  Surgeon: Larey Dresser, MD;   Location: [place] [facility];  Service: Cardiovascular;  Laterality: N/A;  . RIGHT HEART CATH N/A 03/08/2019   Procedure: RIGHT HEART CATH;  Surgeon: Larey Dresser, MD;  Location: [place] [facility];  Service: Cardiovascular;  Laterality: N/A;  . SUBMUCOSAL TATTOO INJECTION  02/28/2019   Procedure: SUBMUCOSAL TATTOO INJECTION;  Surgeon: Milus Banister, MD;  Location: [facility] ENDOSCOPY;  Service: Endoscopy;;  . TOTAL HIP ARTHROPLASTY Right 05/03/2011   Procedure: TOTAL HIP ARTHROPLASTY ANTERIOR APPROACH;  Surgeon: Mcarthur Rossetti;  Location: [facility] ORS;  Service: Orthopedics;  Laterality: Right;  Removal of Cannulated Screws Right Hip, Right Direct Anterior Hip Replacement  . TOTAL HIP ARTHROPLASTY Left 10/09/2018   Procedure: TOTAL HIP ARTHROPLASTY ANTERIOR APPROACH;  Surgeon: Rod Can, MD;  Location: [place];  Service: Orthopedics;  Laterality: Left;    Social History   Socioeconomic History  . Marital status: Widowed    Spouse name: Not on file  . Number of children: Not on file  . Years of education: Not on file  . Highest education level: Not on file  Occupational History  . Occupation: Retired    Fish farm manager: RETIRED  Tobacco Use  . Smoking status: Former Smoker    Packs/day: 0.50    Years: 40.00    Pack years: 20.00    Types: Cigarettes    Quit date: 05/06/2001    Years since quitting: 19.0  . Smokeless tobacco: Never Used  Vaping Use  . Vaping Use: Never used  Substance and Sexual Activity  . Alcohol use: No    Alcohol/week: 0.0 standard drinks  . Drug use: No  . Sexual activity: Never  Other Topics Concern  . 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   ALCOHOL USE-NO   RETIRED   FORMER TOBACCO USE.....1/2 PACK X 40 YRS UNTIL 2003   Would desire CPR   Social Determinants of Health   Financial  Resource Strain: Not on file  Food Insecurity: Not on file  Transportation Needs: Not on file  Physical Activity: Not on file  Stress: Not on file  Social Connections: Not on file  Intimate Partner Violence: Not on file    Family History  Problem Relation Age of Onset  . Heart failure Mother   . Breast cancer Maternal Aunt 82  . Breast cancer Cousin   . Breast cancer Other   . Colon cancer Neg Hx      Current Outpatient Medications:  .  acetaminophen (TYLENOL) 500 MG tablet, Take 500-1,000 mg by mouth every 8 (eight) hours as needed for mild pain or headache. , Disp: , Rfl:  .  apixaban (ELIQUIS) 2.5 MG TABS tablet, Take 1 tablet (2.5 mg total) by mouth 2 (two) times daily. Dispense starter pack, Disp: 60 tablet, Rfl: 0 .  cephALEXin (KEFLEX) 250 MG capsule, Take 1 capsule (250 mg total)  by mouth 2 (two) times daily., Disp: 10 capsule, Rfl: 0 .  cetirizine (ZYRTEC) 10 MG tablet, Take 10 mg by mouth daily., Disp: , Rfl:  .  Cholecalciferol (VITAMIN D) 50 MCG (2000 UT) CAPS, Take 4,000 Units by mouth daily., Disp: , Rfl:  .  Cranberry (AZO CRANBERRY GUMMIES) 500 MG CHEW, Chew 2,000 mg by mouth daily., Disp: , Rfl:  .  Cyanocobalamin (B-12 PO), Take 1 tablet by mouth daily. , Disp: , Rfl:  .  ezetimibe (ZETIA) 10 MG tablet, Take 1 tablet (10 mg total) by mouth daily., Disp: 30 tablet, Rfl: 11 .  ferrous sulfate 325 (65 FE) MG tablet, Take 1 tablet (325 mg total) by mouth 2 (two) times daily with a meal., Disp: 60 tablet, Rfl: 0 .  fluticasone (FLONASE) 50 MCG/ACT nasal spray, Place 2 sprays into both nostrils daily as needed for allergies or rhinitis., Disp: 16 g, Rfl: 6 .  levothyroxine (SYNTHROID) 25 MCG tablet, TAKE 1 TABLET BY MOUTH ONCE DAILY BEFOREBREAKFAST (Patient taking differently: Take 25 mcg by mouth daily before breakfast.), Disp: 30 tablet, Rfl: 3 .  magnesium oxide (MAG-OX) 400 MG tablet, Take 1 tablet (400 mg total) by mouth daily., Disp: 30 tablet, Rfl: 2 .  metolazone  (ZAROXOLYN) 2.5 MG tablet, Take 1 tablet (2.5 mg total) by mouth 2 (two) times a week. On mondays and thursdays, Disp: 8 tablet, Rfl: 3 .  ondansetron (ZOFRAN) 4 MG tablet, Take 1 tablet (4 mg total) by mouth every 8 (eight) hours as needed for nausea or vomiting., Disp: 20 tablet, Rfl: 1 .  pantoprazole (PROTONIX) 40 MG tablet, Take 1 tablet (40 mg total) by mouth daily at 6 (six) AM., Disp: 30 tablet, Rfl: 0 .  potassium chloride SA (KLOR-CON) 20 MEQ tablet, TAKE 2 TABLETS (40 MEQ) BY MOUTH 2 TIMESDAILY. TAKE 1 EXTRA TAB ON METOLAZONE DAYS., Disp: 90 tablet, Rfl: 3 .  rosuvastatin (CRESTOR) 40 MG tablet, Take 1 tablet (40 mg total) by mouth daily., Disp: 90 tablet, Rfl: 3 .  torsemide (DEMADEX) 100 MG tablet, Take 1 tablet (100 mg total) by mouth 2 (two) times daily., Disp: 180 tablet, Rfl: 3 .  torsemide (DEMADEX) 20 MG tablet, Take 1 tablet (20 mg total) by mouth as needed., Disp: 10 tablet, Rfl: 0 .  vitamin C (ASCORBIC ACID) 250 MG tablet, Take 500 mg by mouth daily., Disp: , Rfl:  No current facility-administered medications for this visit.  Facility-Administered Medications Ordered in Other Visits:  .  alteplase (CATHFLO ACTIVASE) injection 2 mg, 2 mg, Intracatheter, Once PRN, Sindy Guadeloupe, MD .  epoetin alfa-epbx (RETACRIT) injection 40,000 Units, 40,000 Units, Subcutaneous, Q30 days, Sindy Guadeloupe, MD, 40,000 Units at 03/23/20 1201 .  heparin lock flush 100 unit/mL, 500 Units, Intracatheter, Once PRN, Sindy Guadeloupe, MD .  heparin lock flush 100 unit/mL, 250 Units, Intracatheter, Once PRN, Sindy Guadeloupe, MD .  sodium chloride flush (NS) 0.9 % injection 10 mL, 10 mL, Intracatheter, Once PRN, Sindy Guadeloupe, MD .  sodium chloride flush (NS) 0.9 % injection 3 mL, 3 mL, Intracatheter, Once PRN, Sindy Guadeloupe, MD  Physical exam: There were no vitals filed for this visit. Physical Exam HENT:     Head: Normocephalic and atraumatic.  Eyes:     Extraocular Movements: EOM normal.      Pupils: Pupils are equal, round, and reactive to light.  Cardiovascular:     Rate and Rhythm: Normal rate and regular rhythm.  Heart sounds: Normal heart sounds.  Pulmonary:     Effort: Pulmonary effort is normal.     Breath sounds: Normal breath sounds.  Abdominal:     General: Bowel sounds are normal.     Palpations: Abdomen is soft.  Musculoskeletal:     Cervical back: Normal range of motion.  Skin:    General: Skin is warm and dry.  Neurological:     Mental Status: She is alert and oriented to person, place, and time.      CMP Latest Ref Rng & Units 04/17/2020  Glucose 70 - 99 mg/dL 91  BUN 6 - 23 mg/dL 36(H)  Creatinine 0.40 - 1.20 mg/dL 1.45(H)  Sodium 135 - 145 mEq/L 138  Potassium 3.5 - 5.1 mEq/L 3.5  Chloride 96 - 112 mEq/L 96  CO2 19 - 32 mEq/L 34(H)  Calcium 8.4 - 10.5 mg/dL 9.2  Total Protein 6.5 - 8.1 g/dL -  Total Bilirubin 0.3 - 1.2 mg/dL -  Alkaline Phos 38 - 126 U/L -  AST 15 - 41 U/L -  ALT 0 - 44 U/L -   CBC Latest Ref Rng & Units 04/17/2020  WBC 4.0 - 10.5 K/uL 3.1(L)  Hemoglobin 12.0 - 15.0 g/dL 8.7 Repeated and verified X2.(L)  Hematocrit 36.0 - 46.0 % 29.1(L)  Platelets 150.0 - 400.0 K/uL 225.0      Assessment and plan- Patient is a 86 y.o. female with normocytic anemia likely multifactorial secondary to iron deficiency and anemia of chronic kidney disease here for routine follow-up     Visit Diagnosis 1. Iron deficiency anemia, unspecified iron deficiency anemia type   2. Anemia of chronic kidney failure, stage 4 (severe) ([place])   3. Erythropoietin (EPO) stimulating agent anemia management patient      Dr. Randa Evens, MD, MPH [facility] [phone] 05/04/2020 7:52 AM

## 2020-05-04 NOTE — Telephone Encounter (Signed)
Pt called triage asking for urine cx results. I advise pt of Webb Silversmith, NP comments and that urine cx was negative. Pt said she is still having dysuria, and it's only improved a little. Pt wants Webb Silversmith, NP to let her know what she should do next (by end of day) because she really thinks she has a UTI, I advise pt it's late in the day so I can't guarantee a phone call back today. Pt requested to schedule an appt with Dr. Einar Pheasant next week to f/u on sxs (she has a TOC appt scheduled with Dr. Einar Pheasant on 07/11/20) just incase she can't get a call back and she's still feeling bad next week. appt scheduled Monday with Dr. Einar Pheasant but will route this message to Dr. Einar Pheasant and Webb Silversmith, NP  Also pt said she was having some chest issues she discuss with Webb Silversmith, NP pt said they are still there but refused nurse triage she said it's been going on for a while and she has already told Webb Silversmith, NP about them she doesn't think it's a "big deal" but will see what Dr. Einar Pheasant thinks next week

## 2020-05-04 NOTE — Telephone Encounter (Signed)
Agree with recommendation for nurse triage. But if declining will discuss next week.   Reviewed note and would agree with [facility] recommendation as urine culture w/o infection no need for antibiotics. If she feels these are helping she can continue.   Also agree with recommendation for Azo over the counter for burning  Would recommend hydration and avoiding any alcohol or caffeine as these are bladder irritants.

## 2020-05-04 NOTE — Telephone Encounter (Signed)
Called and updated patient of Dr. Verda Cumins recommendations. Patient verbalized understanding.

## 2020-05-04 NOTE — Telephone Encounter (Signed)
Call placed to pt in regards to missed appointment, unable to reach pt, no VM.

## 2020-05-06 DIAGNOSIS — E782 Mixed hyperlipidemia: Secondary | ICD-10-CM | POA: Diagnosis not present

## 2020-05-06 DIAGNOSIS — E1122 Type 2 diabetes mellitus with diabetic chronic kidney disease: Secondary | ICD-10-CM | POA: Diagnosis not present

## 2020-05-06 DIAGNOSIS — J449 Chronic obstructive pulmonary disease, unspecified: Secondary | ICD-10-CM | POA: Diagnosis not present

## 2020-05-06 DIAGNOSIS — I272 Pulmonary hypertension, unspecified: Secondary | ICD-10-CM | POA: Diagnosis not present

## 2020-05-06 DIAGNOSIS — E1151 Type 2 diabetes mellitus with diabetic peripheral angiopathy without gangrene: Secondary | ICD-10-CM | POA: Diagnosis not present

## 2020-05-06 DIAGNOSIS — J9611 Chronic respiratory failure with hypoxia: Secondary | ICD-10-CM | POA: Diagnosis not present

## 2020-05-06 DIAGNOSIS — D509 Iron deficiency anemia, unspecified: Secondary | ICD-10-CM | POA: Diagnosis not present

## 2020-05-06 DIAGNOSIS — E039 Hypothyroidism, unspecified: Secondary | ICD-10-CM | POA: Diagnosis not present

## 2020-05-06 DIAGNOSIS — N390 Urinary tract infection, site not specified: Secondary | ICD-10-CM | POA: Diagnosis not present

## 2020-05-06 DIAGNOSIS — I2510 Atherosclerotic heart disease of native coronary artery without angina pectoris: Secondary | ICD-10-CM | POA: Diagnosis not present

## 2020-05-06 DIAGNOSIS — I5032 Chronic diastolic (congestive) heart failure: Secondary | ICD-10-CM | POA: Diagnosis not present

## 2020-05-06 DIAGNOSIS — N184 Chronic kidney disease, stage 4 (severe): Secondary | ICD-10-CM | POA: Diagnosis not present

## 2020-05-06 DIAGNOSIS — E559 Vitamin D deficiency, unspecified: Secondary | ICD-10-CM | POA: Diagnosis not present

## 2020-05-06 DIAGNOSIS — I421 Obstructive hypertrophic cardiomyopathy: Secondary | ICD-10-CM | POA: Diagnosis not present

## 2020-05-06 DIAGNOSIS — Z5181 Encounter for therapeutic drug level monitoring: Secondary | ICD-10-CM | POA: Diagnosis not present

## 2020-05-06 DIAGNOSIS — Z7901 Long term (current) use of anticoagulants: Secondary | ICD-10-CM | POA: Diagnosis not present

## 2020-05-06 DIAGNOSIS — F4322 Adjustment disorder with anxiety: Secondary | ICD-10-CM | POA: Diagnosis not present

## 2020-05-06 DIAGNOSIS — N3941 Urge incontinence: Secondary | ICD-10-CM | POA: Diagnosis not present

## 2020-05-06 DIAGNOSIS — Z95810 Presence of automatic (implantable) cardiac defibrillator: Secondary | ICD-10-CM | POA: Diagnosis not present

## 2020-05-06 DIAGNOSIS — I482 Chronic atrial fibrillation, unspecified: Secondary | ICD-10-CM | POA: Diagnosis not present

## 2020-05-06 DIAGNOSIS — F329 Major depressive disorder, single episode, unspecified: Secondary | ICD-10-CM | POA: Diagnosis not present

## 2020-05-06 DIAGNOSIS — M19041 Primary osteoarthritis, right hand: Secondary | ICD-10-CM | POA: Diagnosis not present

## 2020-05-06 DIAGNOSIS — M19042 Primary osteoarthritis, left hand: Secondary | ICD-10-CM | POA: Diagnosis not present

## 2020-05-06 DIAGNOSIS — E538 Deficiency of other specified B group vitamins: Secondary | ICD-10-CM | POA: Diagnosis not present

## 2020-05-06 DIAGNOSIS — I130 Hypertensive heart and chronic kidney disease with heart failure and stage 1 through stage 4 chronic kidney disease, or unspecified chronic kidney disease: Secondary | ICD-10-CM | POA: Diagnosis not present

## 2020-05-08 ENCOUNTER — Ambulatory Visit: Payer: PPO | Admitting: Family Medicine

## 2020-05-09 ENCOUNTER — Encounter: Payer: Self-pay | Admitting: Family Medicine

## 2020-05-09 ENCOUNTER — Ambulatory Visit (INDEPENDENT_AMBULATORY_CARE_PROVIDER_SITE_OTHER): Payer: PPO | Admitting: Family Medicine

## 2020-05-09 ENCOUNTER — Ambulatory Visit (INDEPENDENT_AMBULATORY_CARE_PROVIDER_SITE_OTHER)
Admission: RE | Admit: 2020-05-09 | Discharge: 2020-05-09 | Disposition: A | Discharge status: Home / Self Care | Payer: PPO | Source: Ambulatory Visit | Attending: Family Medicine | Admitting: Family Medicine

## 2020-05-09 ENCOUNTER — Other Ambulatory Visit: Payer: Self-pay

## 2020-05-09 VITALS — BP 124/50 | HR 67 | Temp 98.2°F | Wt 146.5 lb

## 2020-05-09 DIAGNOSIS — I5032 Chronic diastolic (congestive) heart failure: Secondary | ICD-10-CM

## 2020-05-09 DIAGNOSIS — K922 Gastrointestinal hemorrhage, unspecified: Secondary | ICD-10-CM

## 2020-05-09 DIAGNOSIS — D62 Acute posthemorrhagic anemia: Secondary | ICD-10-CM

## 2020-05-09 DIAGNOSIS — I4821 Permanent atrial fibrillation: Secondary | ICD-10-CM | POA: Diagnosis not present

## 2020-05-09 DIAGNOSIS — J811 Chronic pulmonary edema: Secondary | ICD-10-CM | POA: Diagnosis not present

## 2020-05-09 DIAGNOSIS — J90 Pleural effusion, not elsewhere classified: Secondary | ICD-10-CM | POA: Diagnosis not present

## 2020-05-09 DIAGNOSIS — I509 Heart failure, unspecified: Secondary | ICD-10-CM | POA: Diagnosis not present

## 2020-05-09 NOTE — Patient Instructions (Signed)
Chest x-ray today  Consider calling the cardiologist to let them know  See oncology/hematology next week for anemia

## 2020-05-09 NOTE — Assessment & Plan Note (Signed)
Pt concerned worsening sob and some chest tightness related to CHF. She seems euvolemic today. Will get CXR due to persistence of symptoms, reassuring on my evaluation. Will f/u final read. No changes today. Encouraged her to reach out to cardiology to let them know but as symptoms improvement will monitor.

## 2020-05-09 NOTE — Assessment & Plan Note (Signed)
GI bleed on warfarin. Switched to elliquis. Notes some itching which has improved but reports cardiology was going to change the medication. Advised f/u with cardiology. Rate controlled. Cont elliquis

## 2020-05-09 NOTE — Assessment & Plan Note (Signed)
Discussed that this could be contributing to her symptoms. But has hematology f/u next week and symptoms improving w/o new GI bleeding will hold off on labs today.

## 2020-05-09 NOTE — Assessment & Plan Note (Signed)
Denies any new rectal bleeding. Has hematology f/u with Dr. Janese Banks next week.

## 2020-05-09 NOTE — Progress Notes (Signed)
Subjective:     [name] QUIZHPI is a 87 y.o. female presenting for Follow-up and Chest Pain (Tightness x 2 weeks (better than it was))     HPI   # Chest tightness - 2 weeks of symptoms - left sided, but occasionally epigastric and central chest - can happen at rest, laying down, moving around - thought it was fluid - wears compression stockings and has noticed anemia - endorses chronic shortness of breath, but this was worse than baseline - chronic afib and CHF - follows with cardiology  Worse with strenuous activity - vacuuming  Breathing is improved  Has chronic sinus drainage   Review of Systems   Social History   Tobacco Use  Smoking Status Former Smoker   Packs/day: 0.50   Years: 40.00   Pack years: 20.00   Types: Cigarettes   Quit date: 05/06/2001   Years since quitting: 19.0  Smokeless Tobacco Never Used        Objective:    BP Readings from Last 3 Encounters:  05/09/20 (!) 124/50  05/02/20 124/72  04/17/20 130/84   Wt Readings from Last 3 Encounters:  05/09/20 146 lb 8 oz (66.5 kg)  05/02/20 151 lb (68.5 kg)  04/17/20 150 lb 12.8 oz (68.4 kg)    BP (!) 124/50    Pulse 67    Temp 98.2 F (36.8 C) (Temporal)    Wt 146 lb 8 oz (66.5 kg)    SpO2 99%    BMI 23.65 kg/m    Physical Exam Constitutional:      General: She is not in acute distress.    Appearance: She is well-developed. She is not diaphoretic.  HENT:     Head: Normocephalic and atraumatic.     Right Ear: External ear normal.     Left Ear: External ear normal.     Nose: Nose normal.  Eyes:     Conjunctiva/sclera: Conjunctivae normal.  Neck:     Vascular: No JVD.  Cardiovascular:     Rate and Rhythm: Normal rate. Rhythm irregular.     Heart sounds: Murmur heard.   Systolic murmur is present.   Pulmonary:     Effort: Pulmonary effort is normal. No accessory muscle usage or respiratory distress.     Breath sounds: No decreased breath sounds, wheezing or rales.   Musculoskeletal:     Cervical back: Neck supple.     Right lower leg: Edema present.     Left lower leg: Edema present.     Comments: Trace edema, wearing compression socks  Skin:    General: Skin is warm and dry.     Capillary Refill: Capillary refill takes less than 2 seconds.  Neurological:     General: No focal deficit present.     Mental Status: She is alert. Mental status is at baseline.  Psychiatric:        Mood and Affect: Mood normal.        Behavior: Behavior normal.     CXR on my read: no effusion, some intersitial airspace changes present which seem similar to 03/26/2020 CXR. Pacemaker. Enlarged heart. No acute disease      Assessment & Plan:   Problem List Items Addressed This Visit      Cardiovascular and Mediastinum   Permanent atrial fibrillation    GI bleed on warfarin. Switched to elliquis. Notes some itching which has improved but reports cardiology was going to change the medication. Advised f/u with cardiology. Rate controlled.  Cont elliquis      Chronic diastolic CHF (congestive heart failure) ([place]) - Primary    Pt concerned worsening sob and some chest tightness related to CHF. She seems euvolemic today. Will get CXR due to persistence of symptoms, reassuring on my evaluation. Will f/u final read. No changes today. Encouraged her to reach out to cardiology to let them know but as symptoms improvement will monitor.       Relevant Orders   DG Chest 2 View     Digestive   GI bleed    Denies any new rectal bleeding. Has hematology f/u with Dr. Janese Banks next week.         Other   Acute blood loss anemia    Discussed that this could be contributing to her symptoms. But has hematology f/u next week and symptoms improving w/o new GI bleeding will hold off on labs today.           Return if symptoms worsen or fail to improve.  Lesleigh Noe, MD  This visit occurred during the SARS-CoV-2 public health emergency.  Safety protocols were in place,  including screening questions prior to the visit, additional usage of staff PPE, and extensive cleaning of exam room while observing appropriate contact time as indicated for disinfecting solutions.

## 2020-05-16 ENCOUNTER — Inpatient Hospital Stay: Payer: PPO | Attending: Oncology

## 2020-05-16 ENCOUNTER — Telehealth: Payer: Self-pay | Admitting: Oncology

## 2020-05-16 ENCOUNTER — Inpatient Hospital Stay: Payer: PPO

## 2020-05-16 ENCOUNTER — Inpatient Hospital Stay: Payer: PPO | Admitting: Oncology

## 2020-05-16 NOTE — Telephone Encounter (Signed)
Call placed to pt in regards to missed appointment, unable to reach pt. no VM.

## 2020-05-17 ENCOUNTER — Ambulatory Visit: Payer: Self-pay

## 2020-05-17 ENCOUNTER — Telehealth (HOSPITAL_COMMUNITY): Payer: Self-pay

## 2020-05-17 MED ORDER — APIXABAN 2.5 MG PO TABS
2.5000 mg | ORAL_TABLET | Freq: Two times a day (BID) | ORAL | 11 refills | Status: DC
Start: 2020-05-17 — End: 2021-06-11

## 2020-05-17 NOTE — Progress Notes (Unsigned)
Pt started eliquis and was taken off of coumadin. Ended coumadin episode.

## 2020-05-17 NOTE — Telephone Encounter (Addendum)
Patient called to let you know that her home cardiomems home device has been out of commission for about a week now but someone was there servicing it from the company.

## 2020-05-25 ENCOUNTER — Encounter (HOSPITAL_COMMUNITY): Payer: PPO

## 2020-05-30 ENCOUNTER — Telehealth (HOSPITAL_COMMUNITY): Payer: Self-pay | Admitting: Adult Health

## 2020-05-30 NOTE — Telephone Encounter (Signed)
Cardiomems Reading 24 today and trending up from her Goal 17.   She has not sent a reading today. I have asked her to complete reading.  \ I will call her back this afternoon.    Thirza Pellicano NpC-  11:48 AM

## 2020-06-15 ENCOUNTER — Telehealth: Payer: Self-pay | Admitting: Cardiovascular Disease

## 2020-06-15 NOTE — Telephone Encounter (Signed)
Patient declined recall for now being seen at [facility] clinic Dr. [name].    Deleting recall.

## 2020-06-19 ENCOUNTER — Encounter (HOSPITAL_COMMUNITY): Payer: PPO

## 2020-06-20 NOTE — Progress Notes (Signed)
PCP: Lesleigh Noe, MD Cardiology: Dr. Rockey Situ HF Cardiology: Dr. [name]  87 y.o. with history of permanent atrial fibrillation, chronic diastolic CHF, CKD stage 3, smoking/prior COPD, renal cell CA s/p right nephrectomy.  She has a prior history of HFrEF with nonischemic cardiomyoapthy and had a Medtronic ICD placed.  However, subsequently her EF has recovered.  More recently, she has had symptomatic HFpEF.    She has had dyspnea and edema for "a long time," worse starting 1/20. In 6/20, she had left hip ORIF after mechanical fall.  Echo in 6/20 showed EF 60-65%, mild RV dilation with normal systolic function, PASP 93 mmHg.  After she got home from the hospital stay post-ORIF, she noted marked peripheral edema.  She was short of breath walking short distances.  +orthopnea.  She came back to the hospital on 11/04/18 and was admitted with volume overload.  She was diuresed aggressively and lost about 22 lbs.  RHC showed pulmonary venous hypertension.  PCWP tracing had prominent v-waves in absence of significant MR, suggesting significant diastolic dysfunction.   She has been noted to have significant Fe deficiency anemia, but FOBT was negative during 7/20 hospitalization.   She was admitted in 11/20 with symptomatic anemia, GI workup concerning for ischemic bowel.  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 Doren Custard with VVS and conservative management recommended.  Capsule endoscopy showed no definite source of bleeding.  She was volume overloaded due to significant RV dysfunction and diuresed, she also developed AKI on CKD stage 3.      She returned for follow up of CHF on 10/21.  Breathing is stable, short of breath walking longer distances or up inclines.  No orthopnea/PND.  No chest pain.  She is "dizzy all the time."  Further questioning suggests that this seems to be more of a balance issue than lightheadedness, orthostatics done today were negative.  Weight is up 6 lbs.   Recently, we had cut back on her metolazone from 3 times a week to currently once a week. Metolazone was continued Monday and Thursday, repeat echo ordered.  Today she returns for HF follow up. Just started feeling dizzy. Now takes metolazone as needed, last took a dose 6 days ago. Denies increasing SOB, CP, dizziness, edema, or PND/Orthopnea. Appetite ok. No fever or chills. Weight at home ~144-146 pounds. Taking all medications.   Medtronic device interrogation: OptiVol fluid well below threshold, thoracic impedence at threshold, no VT/VF, less than 30 mins/day activity (personally reviewed).   Cardiomems PADP: 17, goal 17  Labs (7/20): K 3.7, creatinine 1.44 => 1.33 Labs (12/20): pro-BNP 431, K 4.4, creatinine 1.24, hgb 8.2 Labs (10/21): K 4, creatinine 1.83 Labs (12/21): K 3.5, creatinine 1.45 Labs (2/22): K 5.8 (Lokelma ordered), creatinine 1.39  PMH: 1. COPD 2. Renal cell carcinoma: S/p right kidney resection.  3. CKD stage 3 4. Fe deficiency anemia 5. Atrial fibrillation: Permanent. She has failed amiodarone, Tikosyn, and flecainide as well as multiple cardioversions. 6. Chronic diastolic CHF: She has history of prior HFrEF with nonischemic cardiomyopathy.  She has a Medtronic ICD.   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  - More recently, EF has been in normal range but she has had diastolic CHF.  - Echo (3/53): EF 60-65%, mildly dilated RV with PASP 93 mmHg, mild-moderate TR.  - RHC (7/20): mean RA 11, PA 64/18 mean 39, mean PCWP 24 with v waves to 41, CI 4.21, PVR 1.9 WU.  - Cardiomems  placement - RHC (11/20): mean RA 13, PA 63/23 mean 25 with v waves to 47 (MR only mild by echo), mean PCWP 25, CI 3.18, PVR 2.6 WU - Echo (11/21): EF 65-70%, moderate LVH, RVSP 85.9 mmHg, severe RAE, severe TR, mod-sev AS, 7. Hyperlipidemia 8. Carotid stenosis: Carotid dopplers (1/32) with 44-01% LICA stenosis.   9. Possible ischemic bowel in 11/20 10. PAD: Mesenteric artery dopplers (11/20)  with 70-99% celiac and SMA stenosis. Conservative management per Dr. Doren Custard.   Social History   Socioeconomic History   Marital status: Widowed    Spouse name: Not on file   Number of children: Not on file   Years of education: Not on file   Highest education level: Not on file  Occupational History   Occupation: Retired    Fish farm manager: RETIRED  Tobacco Use   Smoking status: Former Smoker    Packs/day: 0.50    Years: 40.00    Pack years: 20.00    Types: Cigarettes    Quit date: 05/06/2001    Years since quitting: 19.1   Smokeless tobacco: Never Used  Scientific laboratory technician Use: Never used  Substance and Sexual Activity   Alcohol use: No    Alcohol/week: 0.0 standard drinks   Drug use: No   Sexual activity: Never  Other Topics Concern   Not on file  Social History Narrative   [place], HAS GROUP OF 4 BEST FRIENDS, SELF TITLED "[place]".   ALCOHOL USE-NO   RETIRED   FORMER TOBACCO USE.....1/2 PACK X 40 YRS UNTIL 2003   Would desire CPR   Social Determinants of Health   Financial Resource Strain: Not on file  Food Insecurity: Not on file  Transportation Needs: Not on file  Physical Activity: Not on file  Stress: Not on file  Social Connections: Not on file  Intimate Partner Violence: Not on file   Family History  Problem Relation Age of Onset   Heart failure Mother    Breast cancer Maternal Aunt 82   Breast cancer Cousin    Breast cancer Other    Colon cancer Neg Hx    ROS: All systems reviewed and negative except as per HPI.   Current Outpatient Medications  Medication Sig Dispense Refill   acetaminophen (TYLENOL) 500 MG tablet Take 500-1,000 mg by mouth every 8 (eight) hours as needed for mild pain or headache.      apixaban (ELIQUIS) 2.5 MG TABS tablet Take 1 tablet (2.5 mg total) by mouth 2 (two) times daily. Dispense starter pack 60 tablet 11    cetirizine (ZYRTEC) 10 MG tablet Take 10 mg by mouth daily.     Cholecalciferol (VITAMIN D) 50 MCG (2000 UT) CAPS Take 4,000 Units by mouth daily.     Cranberry (AZO CRANBERRY GUMMIES) 500 MG CHEW Chew 2,000 mg by mouth daily.     Cyanocobalamin (B-12 PO) Take 1 tablet by mouth daily.      ezetimibe (ZETIA) 10 MG tablet Take 1 tablet (10 mg total) by mouth daily. 30 tablet 11   ferrous sulfate 325 (65 FE) MG tablet Take 1 tablet (325 mg total) by mouth 2 (two) times daily with a meal. 60 tablet 0   fluticasone (FLONASE) 50 MCG/ACT nasal spray Place 2 sprays into both nostrils daily as needed for allergies or rhinitis. 16 g 6   levothyroxine (SYNTHROID)  25 MCG tablet TAKE 1 TABLET BY MOUTH ONCE DAILY BEFOREBREAKFAST 30 tablet 3   magnesium oxide (MAG-OX) 400 MG tablet Take 1 tablet (400 mg total) by mouth daily. 30 tablet 2   metolazone (ZAROXOLYN) 2.5 MG tablet Take 2.5 mg by mouth as needed.     ondansetron (ZOFRAN) 4 MG tablet Take 1 tablet (4 mg total) by mouth every 8 (eight) hours as needed for nausea or vomiting. 20 tablet 1   pantoprazole (PROTONIX) 40 MG tablet Take 1 tablet (40 mg total) by mouth daily at 6 (six) AM. 30 tablet 0   potassium chloride SA (KLOR-CON) 20 MEQ tablet TAKE 2 TABLETS (40 MEQ) BY MOUTH 2 TIMESDAILY. TAKE 1 EXTRA TAB ON METOLAZONE DAYS. 90 tablet 3   rosuvastatin (CRESTOR) 40 MG tablet Take 1 tablet (40 mg total) by mouth daily. 90 tablet 3   torsemide (DEMADEX) 100 MG tablet Take 1 tablet (100 mg total) by mouth 2 (two) times daily. 180 tablet 3   torsemide (DEMADEX) 20 MG tablet Take 1 tablet (20 mg total) by mouth as needed. (Patient taking differently: Take 20 mg by mouth as needed. Taking with metolazone) 10 tablet 0   vitamin C (ASCORBIC ACID) 250 MG tablet Take 500 mg by mouth daily.     No current facility-administered medications for this encounter.   Facility-Administered Medications Ordered in Other Encounters  Medication Dose Route  Frequency Provider Last Rate Last Admin   alteplase (CATHFLO ACTIVASE) injection 2 mg  2 mg Intracatheter Once PRN Sindy Guadeloupe, MD       epoetin alfa-epbx (RETACRIT) injection 40,000 Units  40,000 Units Subcutaneous Q30 days Sindy Guadeloupe, MD   40,000 Units at 03/23/20 1201   heparin lock flush 100 unit/mL  500 Units Intracatheter Once PRN Sindy Guadeloupe, MD       heparin lock flush 100 unit/mL  250 Units Intracatheter Once PRN Sindy Guadeloupe, MD       sodium chloride flush (NS) 0.9 % injection 10 mL  10 mL Intracatheter Once PRN Sindy Guadeloupe, MD       sodium chloride flush (NS) 0.9 % injection 3 mL  3 mL Intracatheter Once PRN Sindy Guadeloupe, MD        Wt Readings from Last 3 Encounters:  06/21/20 66.5 kg (146 lb 9.6 oz)  05/09/20 66.5 kg (146 lb 8 oz)  05/02/20 68.5 kg (151 lb)    BP (!) 182/70    Pulse 60    Wt 66.5 kg (146 lb 9.6 oz)    SpO2 98%    BMI 23.66 kg/m    General:  NAD. No resp difficulty HEENT: Normal Neck: Supple. No JVD. Carotids 2+ bilat; no bruits. No lymphadenopathy or thryomegaly appreciated. Cor: PMI nondisplaced. Irregular rate & rhythm. No rubs, gallops or murmurs. Lungs: Clear Abdomen: Soft, nontender, nondistended. No hepatosplenomegaly. No bruits or masses. Good bowel sounds. Extremities: No cyanosis, clubbing, rash, edema Neuro: alert & oriented x 3, cranial nerves grossly intact. Moves all 4 extremities w/o difficulty. Affect pleasant.  Assessment/Plan: 1. Chronic diastolic CHF: Echo in 0/25 showed EF 60-65%, mild RV dilation with normal systolic function, PASP 93 mmHg. She had remote nonischemic cardiomyopathy with recovery of EF, has Medtronic ICD. I suspect that there is significant RV dysfunction. Atrial fibrillation likely contributes, but this appears to be permanent now as she has failed multiple anti-arrhythmics and is reasonably rate-controlled. [place] 11/20 showed elevated filling pressures and pulmonary venous hypertension.  Prominent  v-waves on PCWP tracing likely due to stiff ventricle/diastolic dysfunction as she had only mild MR on echo. Echo (11/21): EF 65-70%, moderate LVH, RVSP 85.9 mmHg, severe RAE, severe TR, mod-sev AS. She has Cardiomems.  Volume status stable by Cardiomems and weight is stable. - With her dizziness I am concerned she may be slightly hypovolemic. I will decrease torsemide to 100 mg qAM/80mg  qPM. BMET today. 2. Pulmonary hypertension: PA systolic pressure estimated 85.9 mmHg on last echo. Based on [place] 11/20, she has primarily pulmonary venous hypertension.  3. CKD: Stage 3. BMET today   4. Anemia: Fe deficiency. She denies overt bleeding.  Capsule endoscopy during 11/20 admission did not show bleeding source.  5. Atrial fibrillation: Permanent now. Rate control is reasonable. She has failed amiodarone, Tikosyn, and flecainide as well as multiple cardioversions.  - Continue warfarin.  6. HTN: BP 182/70 on arrival to clinic, down to 140/62 with recheck.  - Check BP at home and bring log at next visit.  She will followup with NP/PA in 4-6 weeks and in 4-5 months with Dr. Wynema Birch [facility]  FNP-BC 06/21/2020

## 2020-06-21 ENCOUNTER — Encounter (HOSPITAL_COMMUNITY): Payer: Self-pay

## 2020-06-21 ENCOUNTER — Other Ambulatory Visit: Payer: Self-pay

## 2020-06-21 ENCOUNTER — Ambulatory Visit (HOSPITAL_COMMUNITY)
Admission: RE | Admit: 2020-06-21 | Discharge: 2020-06-21 | Disposition: A | Discharge status: Home / Self Care | Payer: PPO | Source: Ambulatory Visit | Attending: Family Medicine | Admitting: Family Medicine

## 2020-06-21 ENCOUNTER — Other Ambulatory Visit (HOSPITAL_COMMUNITY): Payer: Self-pay | Admitting: *Deleted

## 2020-06-21 ENCOUNTER — Ambulatory Visit: Payer: PPO | Admitting: Family Medicine

## 2020-06-21 ENCOUNTER — Telehealth (HOSPITAL_COMMUNITY): Payer: Self-pay | Admitting: Cardiology

## 2020-06-21 VITALS — BP 140/62 | HR 60 | Wt 146.6 lb

## 2020-06-21 DIAGNOSIS — D509 Iron deficiency anemia, unspecified: Secondary | ICD-10-CM | POA: Diagnosis not present

## 2020-06-21 DIAGNOSIS — I272 Pulmonary hypertension, unspecified: Secondary | ICD-10-CM | POA: Diagnosis not present

## 2020-06-21 DIAGNOSIS — I5032 Chronic diastolic (congestive) heart failure: Secondary | ICD-10-CM | POA: Diagnosis not present

## 2020-06-21 DIAGNOSIS — I10 Essential (primary) hypertension: Secondary | ICD-10-CM

## 2020-06-21 DIAGNOSIS — R42 Dizziness and giddiness: Secondary | ICD-10-CM | POA: Insufficient documentation

## 2020-06-21 DIAGNOSIS — Z7901 Long term (current) use of anticoagulants: Secondary | ICD-10-CM | POA: Diagnosis not present

## 2020-06-21 DIAGNOSIS — D649 Anemia, unspecified: Secondary | ICD-10-CM

## 2020-06-21 DIAGNOSIS — Z8249 Family history of ischemic heart disease and other diseases of the circulatory system: Secondary | ICD-10-CM | POA: Insufficient documentation

## 2020-06-21 DIAGNOSIS — I2510 Atherosclerotic heart disease of native coronary artery without angina pectoris: Secondary | ICD-10-CM | POA: Diagnosis not present

## 2020-06-21 DIAGNOSIS — Z87891 Personal history of nicotine dependence: Secondary | ICD-10-CM | POA: Diagnosis not present

## 2020-06-21 DIAGNOSIS — I428 Other cardiomyopathies: Secondary | ICD-10-CM | POA: Diagnosis not present

## 2020-06-21 DIAGNOSIS — I4821 Permanent atrial fibrillation: Secondary | ICD-10-CM | POA: Diagnosis not present

## 2020-06-21 DIAGNOSIS — I4819 Other persistent atrial fibrillation: Secondary | ICD-10-CM | POA: Diagnosis not present

## 2020-06-21 DIAGNOSIS — I5042 Chronic combined systolic (congestive) and diastolic (congestive) heart failure: Secondary | ICD-10-CM | POA: Insufficient documentation

## 2020-06-21 DIAGNOSIS — J449 Chronic obstructive pulmonary disease, unspecified: Secondary | ICD-10-CM | POA: Insufficient documentation

## 2020-06-21 DIAGNOSIS — I130 Hypertensive heart and chronic kidney disease with heart failure and stage 1 through stage 4 chronic kidney disease, or unspecified chronic kidney disease: Secondary | ICD-10-CM | POA: Insufficient documentation

## 2020-06-21 DIAGNOSIS — N183 Chronic kidney disease, stage 3 unspecified: Secondary | ICD-10-CM | POA: Diagnosis not present

## 2020-06-21 DIAGNOSIS — Z79899 Other long term (current) drug therapy: Secondary | ICD-10-CM | POA: Insufficient documentation

## 2020-06-21 LAB — CBC
HCT: 36 % (ref 36.0–46.0)
Hemoglobin: 11.2 g/dL — ABNORMAL LOW (ref 12.0–15.0)
MCH: 25.2 pg — ABNORMAL LOW (ref 26.0–34.0)
MCHC: 31.1 g/dL (ref 30.0–36.0)
MCV: 81.1 fL (ref 80.0–100.0)
Platelets: 184 10*3/uL (ref 150–400)
RBC: 4.44 MIL/uL (ref 3.87–5.11)
RDW: 18.9 % — ABNORMAL HIGH (ref 11.5–15.5)
WBC: 3.3 10*3/uL — ABNORMAL LOW (ref 4.0–10.5)
nRBC: 0 % (ref 0.0–0.2)

## 2020-06-21 LAB — BASIC METABOLIC PANEL
Anion gap: 8 (ref 5–15)
BUN: 28 mg/dL — ABNORMAL HIGH (ref 8–23)
CO2: 30 mmol/L (ref 22–32)
Calcium: 9.5 mg/dL (ref 8.9–10.3)
Chloride: 99 mmol/L (ref 98–111)
Creatinine, Ser: 1.39 mg/dL — ABNORMAL HIGH (ref 0.44–1.00)
GFR, Estimated: 37 mL/min — ABNORMAL LOW (ref >60)
Glucose, Bld: 95 mg/dL (ref 70–99)
Potassium: 5.8 mmol/L — ABNORMAL HIGH (ref 3.5–5.1)
Sodium: 137 mmol/L (ref 135–145)

## 2020-06-21 MED ORDER — TORSEMIDE 20 MG PO TABS: 80.0000 mg | ORAL_TABLET | Freq: Every evening | ORAL | 0 refills | Status: DC

## 2020-06-21 MED ORDER — LOKELMA 5 G PO PACK
5.0000 g | PACK | Freq: Once | ORAL | 0 refills | Status: DC
Start: 2020-06-21 — End: 2020-06-21

## 2020-06-21 MED ORDER — LOKELMA 5 G PO PACK: 5.0000 g | PACK | Freq: Once | ORAL | 0 refills | Status: AC

## 2020-06-21 MED ORDER — EZETIMIBE 10 MG PO TABS: 10.0000 mg | ORAL_TABLET | Freq: Every day | ORAL | 11 refills | Status: DC

## 2020-06-21 MED ORDER — TORSEMIDE 100 MG PO TABS: 100.0000 mg | ORAL_TABLET | Freq: Every morning | ORAL | 3 refills | Status: DC

## 2020-06-21 MED ORDER — LOKELMA 5 G PO PACK: 5.0000 g | PACK | ORAL | 0 refills | Status: DC

## 2020-06-21 NOTE — Addendum Note (Signed)
Addended by: Kerry Dory on: 06/21/2020 04:47 PM   Modules accepted: Orders

## 2020-06-21 NOTE — Telephone Encounter (Signed)
-----   Message from Rafael Bihari, [place] sent at 06/21/2020  1:51 PM EST ----- - Blood count and kidney function stable.   -Potassium very elevated. STOP potassium supplements. She needs to take 5g of Lokelma today and repeat labs Friday.

## 2020-06-21 NOTE — Patient Instructions (Signed)
DECREASE Torsemide to 100 mg one tab in the AM and 80 mg in the PM  Labs today We will only contact you if something comes back abnormal or we need to make some changes. Otherwise no news is good news!  Your physician recommends that you schedule a follow-up appointment in: 6 weeks  in the [facility] Pract[facility] ([facility]/NP) Clinic and 4-5 months with Dr [name]  Do the following things EVERYDAY: 1) Weigh yourself in the morning before breakfast. Write it down and keep it in a log. 2) Take your medicines as prescribed 3) Eat low salt foods--Limit salt (sodium) to 2000 mg per day.  4) Stay as active as you can everyday 5) Limit all fluids for the day to less than 2 liters  At the [facility], you and your health needs are our priority. As part of our continuing mission to provide you with exceptional heart care, we have created designated Provider Care Teams. These Care Teams include your primary Cardiologist (physician) and Advanced Practice Providers (APPs- Physician Assistants and Nurse Practitioners) who all work together to provide you with the care you need, when you need it.   You may see any of the following providers on your designated Care Team at your next follow up: Marland Kitchen Dr Glori Bickers . Dr Loralie Champagne . Darrick Grinder, NP . Lyda Jester, [place] . Audry Riles, PharmD   Please be sure to bring in all your medications bottles to every appointment.

## 2020-06-21 NOTE — Telephone Encounter (Signed)
[phone] (H) pt aware and voiced understanding Repeat labs to be done with pcp 2/18 Order placed

## 2020-06-23 ENCOUNTER — Telehealth (HOSPITAL_COMMUNITY): Payer: Self-pay | Admitting: *Deleted

## 2020-06-23 DIAGNOSIS — E875 Hyperkalemia: Secondary | ICD-10-CM

## 2020-06-23 NOTE — Telephone Encounter (Signed)
Pt left VM she was not able to get labs done today  Called pt to discuss, she states she stopped the KCL and took the [facility], she states she feels much better today, she called her PCP to sch lab appt but they told her they didn't have an order. Order is in epic so not sure why they couldn't see it, offered to find a [facility] location for pt to go to possible tomorrow but she states she is going out of town to visit family, she would like to wait and go to PCP office on Mon 2/21, advised since she was feeling better that would be ok, replaced order in epic and also faxed a copy of order to [place]

## 2020-06-26 ENCOUNTER — Other Ambulatory Visit (INDEPENDENT_AMBULATORY_CARE_PROVIDER_SITE_OTHER): Payer: PPO

## 2020-06-26 ENCOUNTER — Telehealth: Payer: Self-pay | Admitting: Family Medicine

## 2020-06-26 ENCOUNTER — Other Ambulatory Visit: Payer: Self-pay

## 2020-06-26 DIAGNOSIS — I5032 Chronic diastolic (congestive) heart failure: Secondary | ICD-10-CM

## 2020-06-26 DIAGNOSIS — I10 Essential (primary) hypertension: Secondary | ICD-10-CM | POA: Diagnosis not present

## 2020-06-26 LAB — BASIC METABOLIC PANEL
BUN: 29 mg/dL — ABNORMAL HIGH (ref 6–23)
CO2: 32 mEq/L (ref 19–32)
Calcium: 9.3 mg/dL (ref 8.4–10.5)
Chloride: 97 mEq/L (ref 96–112)
Creatinine, Ser: 1.37 mg/dL — ABNORMAL HIGH (ref 0.40–1.20)
GFR: 35.51 mL/min — ABNORMAL LOW (ref >60.00)
Glucose, Bld: 84 mg/dL (ref 70–99)
Potassium: 3.4 mEq/L — ABNORMAL LOW (ref 3.5–5.1)
Sodium: 137 mEq/L (ref 135–145)

## 2020-06-26 NOTE — Telephone Encounter (Signed)
Orders placed.

## 2020-06-26 NOTE — Telephone Encounter (Signed)
Called and scheduled

## 2020-06-26 NOTE — Telephone Encounter (Signed)
Pt called stating Dr. [name] has put in lab orders and she is wanting to have labs drawn here due to [place] being closer. I do see lab orders have been placed by their office, but since they are not [place] we are unable to go off of those labs. Would you be able to place orders for BMP?

## 2020-06-27 ENCOUNTER — Encounter: Payer: Self-pay | Admitting: Internal Medicine

## 2020-06-27 ENCOUNTER — Ambulatory Visit (INDEPENDENT_AMBULATORY_CARE_PROVIDER_SITE_OTHER): Payer: PPO | Admitting: Internal Medicine

## 2020-06-27 DIAGNOSIS — I428 Other cardiomyopathies: Secondary | ICD-10-CM

## 2020-06-27 LAB — PACEMAKER DEVICE OBSERVATION

## 2020-06-27 NOTE — Patient Instructions (Signed)
Medication Instructions:  Your physician recommends that you continue on your current medications as directed. Please refer to the Current Medication list given to you today.  *If you need a refill on your cardiac medications before your next appointment, please call your pharmacy*   Lab Work: None ordered If you have labs (blood work) drawn today and your tests are completely normal, you will receive your results only by: Marland Kitchen MyChart Message (if you have MyChart) OR . A paper copy in the mail If you have any lab test that is abnormal or we need to change your treatment, we will call you to review the results.   Testing/Procedures: None ordered   Follow-Up: At [facility], you and your health needs are our priority.  As part of our continuing mission to provide you with exceptional heart care, we have created designated Provider Care Teams.  These Care Teams include your primary Cardiologist (physician) and Advanced Practice Providers (APPs -  Physician Assistants and Nurse Practitioners) who all work together to provide you with the care you need, when you need it.  We recommend signing up for the patient portal called "MyChart".  Sign up information is provided on this After Visit Summary.  MyChart is used to connect with patients for Virtual Visits (Telemedicine).  Patients are able to view lab/test results, encounter notes, upcoming appointments, etc.  Non-urgent messages can be sent to your provider as well.   To learn more about what you can do with MyChart, go to [email or web address].    Your next appointment:   Your physician wants you to follow-up in: 12 months You will receive a reminder letter in the mail two months in advance. If you don't receive a letter, please call our office to schedule the follow-up appointment.   The format for your next appointment:   In Person  Provider:   Virl Axe, MD   Other Instructions N/A

## 2020-06-27 NOTE — Progress Notes (Signed)
Patient Care Team: Lesleigh Noe, MD as PCP - General (Family Medicine) Deboraha Sprang, MD as PCP - Cardiology (Cardiology) Larey Dresser, MD as PCP - Advanced Heart Failure (Cardiology) Minna Merritts, MD as Consulting Physician (Cardiology) Deboraha Sprang, MD as Consulting Physician (Cardiology) [name], Dolores Lory, MD as Consulting Physician (Vascular Surgery) Debbora Dus, [facility] as Pharmacist (Pharmacist) Sindy Guadeloupe, MD as Consulting Physician (Hematology and Oncology)   HPI  [name] is a 87 y.o. female Seen in followup for nonischemic cardiomyopathy complicated by acute/chronic heart failure and now permanent atrial fibrillation. Primary prevention ICD implanted 2006 with generator replacement x2 most recently 10/17 Anticoagulation with Apixoban    The patient denies chest pain,, nocturnal dyspnea, orthopnea .  There have been no palpitations, or syncope .    Edema and shortness of breath respond to diuretics; however, also aggravate lightheadedness.  Right now her edema is modest.  Wearing compression stockings.  Healed leg ulcers.  Dyspnea tolerable.       Antiarrhythmics Date Reason stopped  disopyramide 2019 ineffective  amiodarone 2019 nausea  dofetilide 2020 intolerant     DATE TEST EF   2006 LHC `` No Obstructive CAD  1/15 Echo  65 % Mod Pulm HTN, TR and Mild MR   4/16 Echo   65 % Mod-sev PulmHTN with RVE, Mod MR severe TR  6/17 Echo  65% Mild-mod MR  12/18 Myoview 65% No ischemia  7/20 LHC  PA 64/18  PCWP 24 w Vwave 41  7/20 Echo  65% Pulm HTN  11/20 RHC  PA 63 mm; RA 35mm  PCWP 25 ( V waves 47)  11/21 Echo  65-70 Pulm HTN  PAsys 86 mm AS mild mean grad 13 ? More serious w low flow       Date Cr K Hgb TSH LFTs  12/17 1.63 3.5 9.5    7/18 1.5 4.0 10.4    5/19 1.42 4.2 10.6    10/19 2.08 2.8 9.3 7.28   10/19 1.81 3.1 9.1    12/19 1.68 3.5 8.7    7/20 1.45 4.3 10.1 3.68   2/22 1.39 3.4<<5.8 11.2<<8.7               CHADS-VASc score of greater than or equal to 6 (age-69, gender-1, TIA-2 (4/16), heart failure-1)     She has a great grandson [name] to whom she is very attached.  He is a Furniture conservator/restorer and a cross-country running     Past Surgical History:  Procedure Laterality Date  . ABDOMINAL HYSTERECTOMY  1975   for benign causes  . APPENDECTOMY    . BI-VENTRICULAR PACEMAKER UPGRADE  05/04/2010  . BIOPSY  02/28/2019   Procedure: BIOPSY;  Surgeon: Milus Banister, MD;  Location: [facility] ENDOSCOPY;  Service: Endoscopy;;  . CARDIAC CATHETERIZATION  2006  . CARDIOVERSION N/A 02/20/2018   Procedure: CARDIOVERSION;  Surgeon: Minna Merritts, MD;  Location: [place] [facility];  Service: Cardiovascular;  Laterality: N/A;  . CARDIOVERSION N/A 03/27/2018   Procedure: CARDIOVERSION;  Surgeon: Minna Merritts, MD;  Location: [facility]S;  Service: Cardiovascular;  Laterality: N/A;  . CATARACT EXTRACTION W/ INTRAOCULAR LENS  IMPLANT, BILATERAL  01/2006-02-2006  . CHOLECYSTECTOMY N/A 11/11/2012   Procedure: LAPAROSCOPIC CHOLECYSTECTOMY WITH INTRAOPERATIVE CHOLANGIOGRAM;  Surgeon: Imogene Burn. Georgette Dover, MD;  Location: [place];  Service: General;  Laterality: N/A;  . COLONOSCOPY WITH PROPOFOL N/A 02/28/2019   Procedure: COLONOSCOPY WITH PROPOFOL;  Surgeon: Milus Banister, MD;  Location: [facility] ENDOSCOPY;  Service: Endoscopy;  Laterality: N/A;  . ENTEROSCOPY N/A 03/30/2020   Procedure: ENTEROSCOPY;  Surgeon: Lavena Bullion, DO;  Location: [place];  Service: Gastroenterology;  Laterality: N/A;  . EP IMPLANTABLE DEVICE N/A 02/21/2016   Procedure: ICD Generator Changeout;  Surgeon: Deboraha Sprang, MD;  Location: [place] CV LAB;  Service: Cardiovascular;  Laterality: N/A;  . ESOPHAGOGASTRODUODENOSCOPY (EGD) WITH PROPOFOL N/A 02/28/2019   Procedure: ESOPHAGOGASTRODUODENOSCOPY (EGD) WITH PROPOFOL;  Surgeon: Milus Banister, MD;  Location: [facility] ENDOSCOPY;  Service: Endoscopy;  Laterality: N/A;  . GIVENS CAPSULE STUDY N/A 03/15/2019    Procedure: GIVENS CAPSULE STUDY;  Surgeon: Thornton Park, MD;  Location: [place];  Service: Gastroenterology;  Laterality: N/A;  . HOT HEMOSTASIS N/A 03/30/2020   Procedure: HOT HEMOSTASIS (ARGON PLASMA COAGULATION/BICAP);  Surgeon: Lavena Bullion, DO;  Location: [facility] ENDOSCOPY;  Service: Gastroenterology;  Laterality: N/A;  . INSERT / REPLACE / REMOVE PACEMAKER  05-01-11   02-28-05-/05-04-10-ICD-MEDTRONIC MAXIMAL DR  . JOINT REPLACEMENT    . LAPAROSCOPIC CHOLECYSTECTOMY  11/11/2012  . LAPAROSCOPIC LYSIS OF ADHESIONS N/A 11/11/2012   Procedure: LAPAROSCOPIC LYSIS OF ADHESIONS;  Surgeon: Imogene Burn. Georgette Dover, MD;  Location: [place];  Service: General;  Laterality: N/A;  . NEPHRECTOMY Right 06/2001    S/P RENAL CELL CANCER  . PRESSURE SENSOR/CARDIOMEMS N/A 02/03/2019   Procedure: PRESSURE SENSOR/CARDIOMEMS;  Surgeon: Larey Dresser, MD;  Location: [place] [facility];  Service: Cardiovascular;  Laterality: N/A;  . RIGHT HEART CATH N/A 11/09/2018   Procedure: RIGHT HEART CATH;  Surgeon: Larey Dresser, MD;  Location: [place] CV LAB;  Service: Cardiovascular;  Laterality: N/A;  . RIGHT HEART CATH N/A 03/08/2019   Procedure: RIGHT HEART CATH;  Surgeon: Larey Dresser, MD;  Location: [place] CV LAB;  Service: Cardiovascular;  Laterality: N/A;  . SUBMUCOSAL TATTOO INJECTION  02/28/2019   Procedure: SUBMUCOSAL TATTOO INJECTION;  Surgeon: Milus Banister, MD;  Location: [facility] ENDOSCOPY;  Service: Endoscopy;;  . TOTAL HIP ARTHROPLASTY Right 05/03/2011   Procedure: TOTAL HIP ARTHROPLASTY ANTERIOR APPROACH;  Surgeon: Mcarthur Rossetti;  Location: [facility];  Service: Orthopedics;  Laterality: Right;  Removal of Cannulated Screws Right Hip, Right Direct Anterior Hip Replacement  . TOTAL HIP ARTHROPLASTY Left 10/09/2018   Procedure: TOTAL HIP ARTHROPLASTY ANTERIOR APPROACH;  Surgeon: Rod Can, MD;  Location: [place];  Service: Orthopedics;  Laterality: Left;    Current Outpatient  Medications  Medication Sig Dispense Refill  . acetaminophen (TYLENOL) 500 MG tablet Take 500-1,000 mg by mouth every 8 (eight) hours as needed for mild pain or headache.     Marland Kitchen apixaban (ELIQUIS) 2.5 MG TABS tablet Take 1 tablet (2.5 mg total) by mouth 2 (two) times daily. Dispense starter pack 60 tablet 11  . cetirizine (ZYRTEC) 10 MG tablet Take 10 mg by mouth daily.    . Cholecalciferol (VITAMIN D) 50 MCG (2000 UT) CAPS Take 4,000 Units by mouth daily.    . Cranberry (AZO CRANBERRY GUMMIES) 500 MG CHEW Chew 2,000 mg by mouth daily.    . Cyanocobalamin (B-12 PO) Take 1 tablet by mouth daily.     Marland Kitchen ezetimibe (ZETIA) 10 MG tablet Take 1 tablet (10 mg total) by mouth daily. 30 tablet 11  . ferrous sulfate 325 (65 FE) MG tablet Take 1 tablet (325 mg total) by mouth 2 (two) times daily with a meal. 60 tablet 0  . fluticasone (FLONASE) 50 MCG/ACT  nasal spray Place 2 sprays into both nostrils daily as needed for allergies or rhinitis. 16 g 6  . levothyroxine (SYNTHROID) 25 MCG tablet TAKE 1 TABLET BY MOUTH ONCE DAILY BEFOREBREAKFAST 30 tablet 3  . magnesium oxide (MAG-OX) 400 MG tablet Take 1 tablet (400 mg total) by mouth daily. 30 tablet 2  . metolazone (ZAROXOLYN) 2.5 MG tablet Take 2.5 mg by mouth as needed.    . ondansetron (ZOFRAN) 4 MG tablet Take 1 tablet (4 mg total) by mouth every 8 (eight) hours as needed for nausea or vomiting. 20 tablet 1  . pantoprazole (PROTONIX) 40 MG tablet Take 1 tablet (40 mg total) by mouth daily at 6 (six) AM. 30 tablet 0  . rosuvastatin (CRESTOR) 40 MG tablet Take 1 tablet (40 mg total) by mouth daily. 90 tablet 3  . torsemide (DEMADEX) 100 MG tablet Take 1 tablet (100 mg total) by mouth in the morning. To be taken with 80 mg in the PM 30 tablet 3  . torsemide (DEMADEX) 20 MG tablet Take 4 tablets (80 mg total) by mouth every evening. To be taken with 100 mg tab in the AM 120 tablet 0  . vitamin C (ASCORBIC ACID) 250 MG tablet Take 500 mg by mouth daily.     No  current facility-administered medications for this visit.   Facility-Administered Medications Ordered in Other Visits  Medication Dose Route Frequency Provider Last Rate Last Admin  . alteplase (CATHFLO ACTIVASE) injection 2 mg  2 mg Intracatheter Once PRN Sindy Guadeloupe, MD      . epoetin alfa-epbx (RETACRIT) injection 40,000 Units  40,000 Units Subcutaneous Q30 days Sindy Guadeloupe, MD   40,000 Units at 03/23/20 1201  . heparin lock flush 100 unit/mL  500 Units Intracatheter Once PRN Sindy Guadeloupe, MD      . heparin lock flush 100 unit/mL  250 Units Intracatheter Once PRN Sindy Guadeloupe, MD      . sodium chloride flush (NS) 0.9 % injection 10 mL  10 mL Intracatheter Once PRN Sindy Guadeloupe, MD      . sodium chloride flush (NS) 0.9 % injection 3 mL  3 mL Intracatheter Once PRN Sindy Guadeloupe, MD        Allergies  Allergen Reactions  . Dilaudid [Hydromorphone] Other (See Comments)    Excessive Somnolence- Required Narcan  . Fentanyl Other (See Comments)    Required Narcan when given with Dilaudid. Tolerated single dose when given during procedure 02/03/2019.   Marland Kitchen Codeine Nausea And Vomiting    Hallucinations   . Morphine And Related Nausea And Vomiting    Hallucinations   . Sulfonamide Derivatives Other (See Comments)    Dry mouth    Review of Systems negative except from HPI and PMH  Physical Exam BP (!) 146/64 (BP Location: Left Arm, Patient Position: Sitting, Cuff Size: Normal)   Pulse 60   Ht 5' 6.5" (1.689 m)   Wt 145 lb (65.8 kg)   SpO2 97%   BMI 23.05 kg/m  Well developed and well nourished in no acute distress HENT normal Neck supple with JVP>10 Clear Device pocket well healed; without hematoma or erythema.  There is no tethering  Regular rate and rhythm, no   murmur Abd-soft with active BS No Clubbing cyanosis 1+ edema Skin-warm and dry A & Oriented  Grossly normal sensory and motor function  ECG atrial fibrillation with ventricular pacing at  60 Intervals-/16/50  Assessment and  Plan  Nonischemic cardiomyopathy with interval normalization of LV function  Mitral regurgitation  Pulmonary Hypertension  Complete heart block intermittent  Congestive heart failure  chronic/diastolic class IIb-IIIa  Atrial fibrillation/flutter- permanent   Chronic Renal insufficiency grade 3    Implantable defibrillator  Anemia    Permanent atrial fibrillation.  On Eliquis.  Hemoglobin improved. No overt bleeding   Ventricular pacing is at 40% about.  Reprogrammed the device to a lower rate limit of 40 to minimize ventricular pacing  No med changes

## 2020-06-29 ENCOUNTER — Telehealth (HOSPITAL_COMMUNITY): Payer: Self-pay

## 2020-06-29 MED ORDER — POTASSIUM CHLORIDE CRYS ER 20 MEQ PO TBCR: 40.0000 meq | EXTENDED_RELEASE_TABLET | Freq: Two times a day (BID) | ORAL | 3 refills | Status: DC

## 2020-06-29 NOTE — Telephone Encounter (Signed)
Patient advised and verbalized understanding. Patient med list updated to reflect changes.  Meds ordered this encounter  Medications  . potassium chloride SA (KLOR-CON M20) 20 MEQ tablet    Sig: Take 2 tablets (40 mEq total) by mouth 2 (two) times daily. Take 1 extra tablet on the days you take metolazone.    Dispense:  360 tablet    Refill:  3

## 2020-06-29 NOTE — Telephone Encounter (Signed)
-----   Message from Larey Dresser, MD sent at 06/27/2020  4:02 PM EST ----- Yes, would restart her prior K dose.

## 2020-07-03 ENCOUNTER — Telehealth (HOSPITAL_COMMUNITY): Payer: Self-pay | Admitting: *Deleted

## 2020-07-03 NOTE — Telephone Encounter (Signed)
Pt left vm stating she had no energy and wanted to know if she needed to adjust her potassium again. I called pt back to get more information. No answer/lmtrc.

## 2020-07-05 ENCOUNTER — Ambulatory Visit (INDEPENDENT_AMBULATORY_CARE_PROVIDER_SITE_OTHER): Payer: PPO | Admitting: Emergency Medicine

## 2020-07-05 ENCOUNTER — Ambulatory Visit (INDEPENDENT_AMBULATORY_CARE_PROVIDER_SITE_OTHER): Payer: PPO

## 2020-07-05 ENCOUNTER — Telehealth: Payer: Self-pay | Admitting: Internal Medicine

## 2020-07-05 ENCOUNTER — Telehealth (HOSPITAL_COMMUNITY): Payer: Self-pay | Admitting: Cardiology

## 2020-07-05 ENCOUNTER — Other Ambulatory Visit: Payer: Self-pay

## 2020-07-05 DIAGNOSIS — I428 Other cardiomyopathies: Secondary | ICD-10-CM

## 2020-07-05 DIAGNOSIS — Z950 Presence of cardiac pacemaker: Secondary | ICD-10-CM | POA: Diagnosis not present

## 2020-07-05 NOTE — Telephone Encounter (Signed)
Pt returned my call and said her bp is not low her heart rate is in the 40s. Pt said her heart rate has been running in the 50s since Dr.Klein adjusted her pacemaker. Pt states she is dizzy. Pt is not experiencing any heart failure issues. Denies shortness of breath, swelling/edema, or chest pain. Pt call transferred to [facility] heart care to speak with Dr.Klein about pacemaker settings.

## 2020-07-05 NOTE — Telephone Encounter (Signed)
Patient assisted with sending remote transmission. Device function WNL, presenting EGM VS in 50s, no events. Patient reports she has had increased dizziness and near syncope since visit with Dr Caryl Comes 06/27/20. Lower rate was programmed from 60 bpm to 40 bpm at the appointment. She reports she felt the best she had felt in months until after the 06/27/20 appointment. No fever, chills, CP, no change in baseline SOB. Optivol below baseline.Will bring patient into device clinic to evaluate .

## 2020-07-05 NOTE — Telephone Encounter (Signed)
To Device clinic to review.

## 2020-07-05 NOTE — Telephone Encounter (Signed)
Pt left VM on 2/28 I returned pts call no answer/left vm for return call. I received message from scheduling today that patient wanted a work in appt. I called pt back to get more information again no answer/left msg for return call.

## 2020-07-05 NOTE — Telephone Encounter (Signed)
Dr Caryl Comes changed device settings at last visit  Patient pulse has been low, staying in low 50s and today 44 Patient has also been dizzy - would like to know if she can be seen soon Please call to discuss

## 2020-07-05 NOTE — Telephone Encounter (Signed)
Pt is complaining of severe dizziness , and BP is low, irregular heartbeat, pt would like to be worked in today, please called pt @ [phone], pt stated she left several messages

## 2020-07-05 NOTE — Progress Notes (Signed)
Pacemaker check in clinic due to increased dizziness after programming change 06/27/20. Normal device function. Thresholds, sensing, impedances consistent with previous measurements. Device programmed to maximize longevity. No high ventricular rates noted. Device programmed at appropriate safety margins. Histograms show HR primarily 40-50's sin 06/27/20. Lower rate programmed from 40 bpm to 60 bpm per SK.Histogram distribution appropriate for patient activity level. Device programmed to optimize intrinsic conduction. Estimated longevity 3 years , 2 months. Patient enrolled in remote follow-up. Patient education completed.

## 2020-07-06 LAB — CUP PACEART REMOTE DEVICE CHECK
Battery Remaining Longevity: 38 mo
Battery Voltage: 2.96 V
Brady Statistic AP VP Percent: 0 %
Brady Statistic AP VS Percent: 0 %
Brady Statistic AS VP Percent: 0 %
Brady Statistic AS VS Percent: 0 %
Brady Statistic RA Percent Paced: 0 %
Brady Statistic RV Percent Paced: 13.79 %
Date Time Interrogation Session: 20220302115454
HighPow Impedance: 45 Ohm
HighPow Impedance: 59 Ohm
Implantable Lead Implant Date: 20061026
Implantable Lead Implant Date: 20111230
Implantable Lead Location: 753859
Implantable Lead Location: 753860
Implantable Lead Model: 5076
Implantable Lead Model: 6947
Implantable Pulse Generator Implant Date: 20171018
Lead Channel Impedance Value: 361 Ohm
Lead Channel Impedance Value: 456 Ohm
Lead Channel Impedance Value: 475 Ohm
Lead Channel Pacing Threshold Amplitude: 0.625 V
Lead Channel Pacing Threshold Pulse Width: 0.4 ms
Lead Channel Sensing Intrinsic Amplitude: 0.25 mV
Lead Channel Sensing Intrinsic Amplitude: 0.375 mV
Lead Channel Sensing Intrinsic Amplitude: 8.5 mV
Lead Channel Sensing Intrinsic Amplitude: 8.5 mV
Lead Channel Setting Pacing Amplitude: 2.5 V
Lead Channel Setting Pacing Pulse Width: 0.4 ms
Lead Channel Setting Sensing Sensitivity: 0.3 mV

## 2020-07-11 ENCOUNTER — Ambulatory Visit (INDEPENDENT_AMBULATORY_CARE_PROVIDER_SITE_OTHER): Payer: PPO | Admitting: Family Medicine

## 2020-07-11 ENCOUNTER — Encounter: Payer: Self-pay | Admitting: Family Medicine

## 2020-07-11 ENCOUNTER — Other Ambulatory Visit: Payer: Self-pay

## 2020-07-11 VITALS — BP 150/58 | HR 64 | Temp 97.2°F | Ht 67.0 in | Wt 145.2 lb

## 2020-07-11 DIAGNOSIS — D508 Other iron deficiency anemias: Secondary | ICD-10-CM

## 2020-07-11 DIAGNOSIS — I10 Essential (primary) hypertension: Secondary | ICD-10-CM

## 2020-07-11 DIAGNOSIS — R42 Dizziness and giddiness: Secondary | ICD-10-CM | POA: Diagnosis not present

## 2020-07-11 DIAGNOSIS — E782 Mixed hyperlipidemia: Secondary | ICD-10-CM

## 2020-07-11 DIAGNOSIS — I5032 Chronic diastolic (congestive) heart failure: Secondary | ICD-10-CM

## 2020-07-11 DIAGNOSIS — E039 Hypothyroidism, unspecified: Secondary | ICD-10-CM | POA: Diagnosis not present

## 2020-07-11 DIAGNOSIS — I4821 Permanent atrial fibrillation: Secondary | ICD-10-CM

## 2020-07-11 NOTE — Assessment & Plan Note (Signed)
Wonder if slight dehydration in setting of diuretics. Hold evening torsemide and f/u. Also will check labs to evaluate for anemia.

## 2020-07-11 NOTE — Assessment & Plan Note (Signed)
Tolerating the crestor 40 mg and zetia 10 mg. Notes some achy legs

## 2020-07-11 NOTE — Patient Instructions (Signed)
Hold the evening Torsemide today and tomorrow  Hold evening potassium if holding torsemide  If worsening swelling, breathing -- take both dose of torsemide tomorrow  Goal -- see if this helps with the dizziness

## 2020-07-11 NOTE — Assessment & Plan Note (Signed)
Stable with pacemaker. Cont eliquis 2.5 mg bid

## 2020-07-11 NOTE — Assessment & Plan Note (Signed)
Lab Results  Component Value Date   TSH 2.20 02/09/2020   Stable. Cont levothyroxine 25 mcg

## 2020-07-11 NOTE — Assessment & Plan Note (Addendum)
Stable. Torsemide 100 mg AM, 80 mg PM. Has metolazone 2.5 mg prn - not needed over 2 weeks. She appears either euvolemic or dry in setting of orthostatic and constant dizziness. Advised holding evening torsemide and potassium for 2 nights and monitoring swelling/weights. Cont with plan to see cardiology next month.

## 2020-07-11 NOTE — Assessment & Plan Note (Signed)
Stable recently but with dizziness will recheck to rule out as cause.

## 2020-07-11 NOTE — Progress Notes (Signed)
Subjective:     [name] is a 87 y.o. female presenting for Transitions Of Care and Follow-up (8 weeks )     HPI  #Dizziness - had changes in her potassium - had high potassium - held torsemide and potassium and took a medication for a few days - noticed worsening dizziness since this - weight is fluctuating but steady - wearing support stockings and stable swelling - stable SOB - worse with turning the head quickly - feeling dizzy just sitting and worse with standing up - presyncopal symptoms - no blood in stool or dark stools - no heartburn - no d/n/v - dizziness over the last 2 weeks  #HTN - was high at the CHF clinic - 109 the other day - typically 140-150s - has 6 week follow-up with CHF clinic   Pacemaker recently adjusted with Dr. Caryl Comes - bp was low after that visit and she returned to her previous  Review of Systems   Social History   Tobacco Use  Smoking Status Former Smoker  . Packs/day: 0.50  . Years: 40.00  . Pack years: 20.00  . Types: Cigarettes  . Quit date: 05/06/2001  . Years since quitting: 19.1  Smokeless Tobacco Never Used        Objective:    BP Readings from Last 3 Encounters:  07/11/20 (!) 150/58  06/27/20 (!) 146/64  06/21/20 140/62   Wt Readings from Last 3 Encounters:  07/11/20 145 lb 4 oz (65.9 kg)  06/27/20 145 lb (65.8 kg)  06/21/20 146 lb 9.6 oz (66.5 kg)    BP (!) 150/58   Pulse 64   Temp (!) 97.2 F (36.2 C) (Temporal)   Ht 5\' 7"  (1.702 m)   Wt 145 lb 4 oz (65.9 kg)   SpO2 99%   BMI 22.75 kg/m    Physical Exam Constitutional:      General: She is not in acute distress.    Appearance: She is well-developed. She is not diaphoretic.  HENT:     Head: Normocephalic and atraumatic.     Right Ear: External ear normal.     Left Ear: External ear normal.  Eyes:     Conjunctiva/sclera: Conjunctivae normal.  Neck:     Comments: Unable to appreciate JVD Cardiovascular:     Rate and Rhythm: Normal  rate and regular rhythm.     Heart sounds: Murmur heard.    Pulmonary:     Effort: Pulmonary effort is normal. No respiratory distress.     Breath sounds: Normal breath sounds. No wheezing or rales.  Musculoskeletal:     Cervical back: Neck supple.     Right lower leg: No edema.     Left lower leg: No edema.  Skin:    General: Skin is warm and dry.     Capillary Refill: Capillary refill takes less than 2 seconds.  Neurological:     Mental Status: She is alert. Mental status is at baseline.  Psychiatric:        Mood and Affect: Mood normal.        Behavior: Behavior normal.           Assessment & Plan:   Problem List Items Addressed This Visit      Cardiovascular and Mediastinum   Permanent atrial fibrillation - Primary    Stable with pacemaker. Cont eliquis 2.5 mg bid      Relevant Orders   Basic metabolic panel   Hypertension  BP elevated. Cont home monitoring and plan to see cardiology in 4 weeks.       Relevant Orders   Basic metabolic panel   Chronic diastolic CHF (congestive heart failure) (HCC)    Stable. Torsemide 100 mg AM, 80 mg PM. Has metolazone 2.5 mg prn - not needed over 2 weeks. She appears either euvolemic or dry in setting of orthostatic and constant dizziness. Advised holding evening torsemide and potassium for 2 nights and monitoring swelling/weights. Cont with plan to see cardiology next month.       Relevant Orders   Basic metabolic panel     Endocrine   Hypothyroidism    Lab Results  Component Value Date   TSH 2.20 02/09/2020   Stable. Cont levothyroxine 25 mcg         Other   Mixed hyperlipidemia    Tolerating the crestor 40 mg and zetia 10 mg. Notes some achy legs      Intermittent lightheadedness    Wonder if slight dehydration in setting of diuretics. Hold evening torsemide and f/u. Also will check labs to evaluate for anemia.       Iron deficiency anemia    Stable recently but with dizziness will recheck to rule out  as cause.       Relevant Orders   CBC       Return in about 6 months (around 01/11/2021).  Lesleigh Noe, MD  This visit occurred during the SARS-CoV-2 public health emergency.  Safety protocols were in place, including screening questions prior to the visit, additional usage of staff PPE, and extensive cleaning of exam room while observing appropriate contact time as indicated for disinfecting solutions.

## 2020-07-11 NOTE — Assessment & Plan Note (Signed)
BP elevated. Cont home monitoring and plan to see cardiology in 4 weeks.

## 2020-07-12 ENCOUNTER — Telehealth: Payer: Self-pay | Admitting: Family Medicine

## 2020-07-12 LAB — BASIC METABOLIC PANEL
BUN: 27 mg/dL — ABNORMAL HIGH (ref 6–23)
CO2: 34 mEq/L — ABNORMAL HIGH (ref 19–32)
Calcium: 9.6 mg/dL (ref 8.4–10.5)
Chloride: 96 mEq/L (ref 96–112)
Creatinine, Ser: 1.42 mg/dL — ABNORMAL HIGH (ref 0.40–1.20)
GFR: 34 mL/min — ABNORMAL LOW (ref >60.00)
Glucose, Bld: 79 mg/dL (ref 70–99)
Potassium: 4.4 mEq/L (ref 3.5–5.1)
Sodium: 138 mEq/L (ref 135–145)

## 2020-07-12 LAB — CBC
HCT: 37.3 % (ref 36.0–46.0)
Hemoglobin: 11.8 g/dL — ABNORMAL LOW (ref 12.0–15.0)
MCHC: 31.5 g/dL (ref 30.0–36.0)
MCV: 80 fl (ref 78.0–100.0)
Platelets: 184 10*3/uL (ref 150.0–400.0)
RBC: 4.66 Mil/uL (ref 3.87–5.11)
RDW: 21 % — ABNORMAL HIGH (ref 11.5–15.5)
WBC: 3.8 10*3/uL — ABNORMAL LOW (ref 4.0–10.5)

## 2020-07-12 NOTE — Telephone Encounter (Signed)
Called patient and scheduled 3/10 at 12.

## 2020-07-12 NOTE — Telephone Encounter (Signed)
Patient believes she is having another flare up of cellulitis. Patient is requesting an antibiotic if possible. Please advise. EM

## 2020-07-12 NOTE — Telephone Encounter (Signed)
This requires an appointment to assess.   I have a 12 pm on Thursday or could schedule with another provider.

## 2020-07-13 ENCOUNTER — Encounter: Payer: Self-pay | Admitting: Family Medicine

## 2020-07-13 ENCOUNTER — Other Ambulatory Visit: Payer: Self-pay

## 2020-07-13 ENCOUNTER — Ambulatory Visit (INDEPENDENT_AMBULATORY_CARE_PROVIDER_SITE_OTHER): Payer: PPO | Admitting: Family Medicine

## 2020-07-13 VITALS — BP 150/70 | HR 56 | Temp 96.1°F | Ht 67.0 in | Wt 147.0 lb

## 2020-07-13 DIAGNOSIS — R42 Dizziness and giddiness: Secondary | ICD-10-CM | POA: Diagnosis not present

## 2020-07-13 DIAGNOSIS — L03116 Cellulitis of left lower limb: Secondary | ICD-10-CM | POA: Diagnosis not present

## 2020-07-13 DIAGNOSIS — L989 Disorder of the skin and subcutaneous tissue, unspecified: Secondary | ICD-10-CM | POA: Diagnosis not present

## 2020-07-13 HISTORY — DX: Cellulitis of left lower limb: L03.116

## 2020-07-13 MED ORDER — DOXYCYCLINE HYCLATE 100 MG PO TABS: 100.0000 mg | ORAL_TABLET | Freq: Two times a day (BID) | ORAL | 0 refills | Status: DC

## 2020-07-13 NOTE — Progress Notes (Signed)
Remote ICD transmission.   

## 2020-07-13 NOTE — Progress Notes (Signed)
Subjective:     [name] is a 87 y.o. female presenting for Sore (L foot, oozing x 2 days )     HPI  #Cellulitis - same place a previous infection - last time it healed up  - did get hospitalized for this in the past - no hx of drainage - responded to doxycycline - did have red streaks in the past   Redness started on 07/11/2020 Taking tylenol for pain - feels sore - did have some drainage - bloody push - area was more raised before  # Lightheadedness - no improvement with holding afternoon torsemide - chronic recurrent issue - has not seen ENT   Review of Systems   Social History   Tobacco Use  Smoking Status Former Smoker  . Packs/day: 0.50  . Years: 40.00  . Pack years: 20.00  . Types: Cigarettes  . Quit date: 05/06/2001  . Years since quitting: 19.2  Smokeless Tobacco Never Used        Objective:    BP Readings from Last 3 Encounters:  07/13/20 (!) 150/70  07/11/20 (!) 150/58  06/27/20 (!) 146/64   Wt Readings from Last 3 Encounters:  07/13/20 147 lb (66.7 kg)  07/11/20 145 lb 4 oz (65.9 kg)  06/27/20 145 lb (65.8 kg)    BP (!) 150/70   Pulse (!) 56   Temp (!) 96.1 F (35.6 C) (Temporal)   Ht 5\' 7"  (1.702 m)   Wt 147 lb (66.7 kg)   SpO2 100%   BMI 23.02 kg/m    Physical Exam Constitutional:      General: She is not in acute distress.    Appearance: She is well-developed. She is not diaphoretic.  HENT:     Right Ear: External ear normal.     Left Ear: External ear normal.     Nose: Nose normal.  Eyes:     Conjunctiva/sclera: Conjunctivae normal.  Cardiovascular:     Rate and Rhythm: Normal rate and regular rhythm.     Heart sounds: Murmur heard.    Pulmonary:     Effort: Pulmonary effort is normal. No respiratory distress.     Breath sounds: Normal breath sounds. No wheezing.  Musculoskeletal:     Cervical back: Neck supple.  Skin:    General: Skin is warm and dry.     Capillary Refill: Capillary refill takes  less than 2 seconds.     Comments: Right foot with scabbed skin lesion surrounded by skin erythema. Warm and extremely tender to light touch  Neurological:     Mental Status: She is alert. Mental status is at baseline.  Psychiatric:        Mood and Affect: Mood normal.        Behavior: Behavior normal.           Assessment & Plan:   Problem List Items Addressed This Visit      Musculoskeletal and Integument   Skin lesion of foot    Scabbed over lesion at area of cellulitis which she notes is present since last infection. Discussed concern for possible other etiology given lack of resolution. She will contact her dermatologist if it does not go away.         Other   Intermittent lightheadedness    No improvement with holding lasix. She will f/u with cardiology. If not improved consider ENT referral due to trigger of turning head - however - no room spinning so unclear if vertigo  component based on hx.       Cellulitis of left lower extremity - Primary    Mild, given hx of prior will start doxycycline as she said this worked in the past. ER and return precautions discussed.       Relevant Medications   doxycycline (VIBRA-TABS) 100 MG tablet       Return if symptoms worsen or fail to improve.  Lesleigh Noe, MD  This visit occurred during the SARS-CoV-2 public health emergency.  Safety protocols were in place, including screening questions prior to the visit, additional usage of staff PPE, and extensive cleaning of exam room while observing appropriate contact time as indicated for disinfecting solutions.

## 2020-07-13 NOTE — Assessment & Plan Note (Signed)
No improvement with holding lasix. She will f/u with cardiology. If not improved consider ENT referral due to trigger of turning head - however - no room spinning so unclear if vertigo component based on hx.

## 2020-07-13 NOTE — Patient Instructions (Addendum)
Start doxycycline 100 mg twice daily  If no redness at 7 days can stop, otherwise continue for 10 days  Call if worsening redness, fevers, chills, nausea/vomiting.  #lightheadedness - see what cardiology says - if cardiology says it is not related to your heart we can consider ENT referral

## 2020-07-13 NOTE — Assessment & Plan Note (Signed)
Mild, given hx of prior will start doxycycline as she said this worked in the past. ER and return precautions discussed.

## 2020-07-13 NOTE — Assessment & Plan Note (Signed)
Scabbed over lesion at area of cellulitis which she notes is present since last infection. Discussed concern for possible other etiology given lack of resolution. She will contact her dermatologist if it does not go away.

## 2020-07-21 ENCOUNTER — Telehealth: Payer: Self-pay | Admitting: Family Medicine

## 2020-07-21 NOTE — Telephone Encounter (Signed)
And she prefers the late afternoon

## 2020-07-21 NOTE — Telephone Encounter (Signed)
It looks like the 4pm should be open on my view.

## 2020-07-21 NOTE — Telephone Encounter (Signed)
Called and scheduled appt for 3/21 at 4:00.

## 2020-07-21 NOTE — Telephone Encounter (Signed)
[name] called in wanted to know about seeing Dr. Einar Pheasant @ 4 on Monday 3/ 21 for a check on her cellulitis but it showed that Dr. Einar Pheasant met her max.  Wanted to know if she can be seen then.

## 2020-07-24 ENCOUNTER — Ambulatory Visit (INDEPENDENT_AMBULATORY_CARE_PROVIDER_SITE_OTHER): Payer: PPO | Admitting: Family Medicine

## 2020-07-24 ENCOUNTER — Other Ambulatory Visit: Payer: Self-pay

## 2020-07-24 ENCOUNTER — Encounter: Payer: Self-pay | Admitting: Family Medicine

## 2020-07-24 VITALS — BP 138/60 | HR 59 | Temp 98.1°F | Ht 66.5 in | Wt 145.2 lb

## 2020-07-24 DIAGNOSIS — L989 Disorder of the skin and subcutaneous tissue, unspecified: Secondary | ICD-10-CM | POA: Diagnosis not present

## 2020-07-24 DIAGNOSIS — R42 Dizziness and giddiness: Secondary | ICD-10-CM

## 2020-07-24 DIAGNOSIS — L03116 Cellulitis of left lower limb: Secondary | ICD-10-CM

## 2020-07-24 NOTE — Assessment & Plan Note (Signed)
Pt has not contacted dermatologist - she will call to make f/u appointment.

## 2020-07-24 NOTE — Progress Notes (Signed)
   Subjective:     [name] is a 87 y.o. female presenting for Follow-up (Cellulitis- L leg )     HPI  #Cellulitis - finished the antibiotic yesterday - normal throughout the day - not using support hoes due to scab  - has a scab on the foot which has been present for about 1 year -   Review of Systems   Social History   Tobacco Use  Smoking Status Former Smoker  . Packs/day: 0.50  . Years: 40.00  . Pack years: 20.00  . Types: Cigarettes  . Quit date: 05/06/2001  . Years since quitting: 19.2  Smokeless Tobacco Never Used        Objective:    BP Readings from Last 3 Encounters:  07/24/20 138/60  07/13/20 (!) 150/70  07/11/20 (!) 150/58   Wt Readings from Last 3 Encounters:  07/24/20 145 lb 4 oz (65.9 kg)  07/13/20 147 lb (66.7 kg)  07/11/20 145 lb 4 oz (65.9 kg)    BP 138/60   Pulse (!) 59   Temp 98.1 F (36.7 C) (Temporal)   Ht 5' 6.5" (1.689 m)   Wt 145 lb 4 oz (65.9 kg)   SpO2 97%   BMI 23.09 kg/m    Physical Exam Constitutional:      General: She is not in acute distress.    Appearance: She is well-developed. She is not diaphoretic.  HENT:     Right Ear: External ear normal.     Left Ear: External ear normal.     Nose: Nose normal.  Eyes:     Conjunctiva/sclera: Conjunctivae normal.  Cardiovascular:     Rate and Rhythm: Normal rate.  Pulmonary:     Effort: Pulmonary effort is normal.  Musculoskeletal:     Cervical back: Neck supple.  Skin:    General: Skin is warm and dry.     Capillary Refill: Capillary refill takes less than 2 seconds.     Comments: Scab lesion still present. No erythema.   Neurological:     Mental Status: She is alert. Mental status is at baseline.  Psychiatric:        Mood and Affect: Mood normal.        Behavior: Behavior normal.           Assessment & Plan:   Problem List Items Addressed This Visit      Musculoskeletal and Integument   Skin lesion of foot    Pt has not contacted  dermatologist - she will call to make f/u appointment.         Other   Intermittent lightheadedness    Again discussed plan to check in with cardiology. If not improving will plan for ENT consult      Cellulitis of left lower extremity - Primary    Resolved with abx.           Return if symptoms worsen or fail to improve.  Lesleigh Noe, MD  This visit occurred during the SARS-CoV-2 public health emergency.  Safety protocols were in place, including screening questions prior to the visit, additional usage of staff PPE, and extensive cleaning of exam room while observing appropriate contact time as indicated for disinfecting solutions.

## 2020-07-24 NOTE — Patient Instructions (Addendum)
Foot scab - call the dermatologist to make a follow-up visit  Dizziness - check with cardiology - if no change or improvement call and I can place referral to ENT specialist

## 2020-07-24 NOTE — Assessment & Plan Note (Signed)
Resolved with abx   

## 2020-07-24 NOTE — Assessment & Plan Note (Signed)
Again discussed plan to check in with cardiology. If not improving will plan for ENT consult

## 2020-08-01 ENCOUNTER — Other Ambulatory Visit: Payer: Self-pay

## 2020-08-01 ENCOUNTER — Encounter (HOSPITAL_COMMUNITY): Payer: Self-pay

## 2020-08-01 ENCOUNTER — Ambulatory Visit (HOSPITAL_COMMUNITY)
Admission: RE | Admit: 2020-08-01 | Discharge: 2020-08-01 | Disposition: A | Discharge status: Home / Self Care | Payer: PPO | Source: Ambulatory Visit | Attending: Cardiology | Admitting: Cardiology

## 2020-08-01 VITALS — BP 158/62 | HR 65 | Wt 146.8 lb

## 2020-08-01 DIAGNOSIS — E785 Hyperlipidemia, unspecified: Secondary | ICD-10-CM | POA: Diagnosis not present

## 2020-08-01 DIAGNOSIS — I272 Pulmonary hypertension, unspecified: Secondary | ICD-10-CM | POA: Insufficient documentation

## 2020-08-01 DIAGNOSIS — Z85528 Personal history of other malignant neoplasm of kidney: Secondary | ICD-10-CM | POA: Diagnosis not present

## 2020-08-01 DIAGNOSIS — I4821 Permanent atrial fibrillation: Secondary | ICD-10-CM | POA: Diagnosis not present

## 2020-08-01 DIAGNOSIS — Z79899 Other long term (current) drug therapy: Secondary | ICD-10-CM | POA: Insufficient documentation

## 2020-08-01 DIAGNOSIS — N183 Chronic kidney disease, stage 3 unspecified: Secondary | ICD-10-CM | POA: Diagnosis not present

## 2020-08-01 DIAGNOSIS — I2510 Atherosclerotic heart disease of native coronary artery without angina pectoris: Secondary | ICD-10-CM | POA: Diagnosis not present

## 2020-08-01 DIAGNOSIS — I5032 Chronic diastolic (congestive) heart failure: Secondary | ICD-10-CM | POA: Diagnosis not present

## 2020-08-01 DIAGNOSIS — Z7901 Long term (current) use of anticoagulants: Secondary | ICD-10-CM | POA: Insufficient documentation

## 2020-08-01 DIAGNOSIS — Z95810 Presence of automatic (implantable) cardiac defibrillator: Secondary | ICD-10-CM | POA: Insufficient documentation

## 2020-08-01 DIAGNOSIS — I130 Hypertensive heart and chronic kidney disease with heart failure and stage 1 through stage 4 chronic kidney disease, or unspecified chronic kidney disease: Secondary | ICD-10-CM | POA: Diagnosis not present

## 2020-08-01 DIAGNOSIS — J449 Chronic obstructive pulmonary disease, unspecified: Secondary | ICD-10-CM | POA: Insufficient documentation

## 2020-08-01 DIAGNOSIS — D509 Iron deficiency anemia, unspecified: Secondary | ICD-10-CM | POA: Insufficient documentation

## 2020-08-01 DIAGNOSIS — Z905 Acquired absence of kidney: Secondary | ICD-10-CM | POA: Insufficient documentation

## 2020-08-01 DIAGNOSIS — I428 Other cardiomyopathies: Secondary | ICD-10-CM | POA: Diagnosis not present

## 2020-08-01 DIAGNOSIS — I10 Essential (primary) hypertension: Secondary | ICD-10-CM | POA: Diagnosis not present

## 2020-08-01 DIAGNOSIS — Z87891 Personal history of nicotine dependence: Secondary | ICD-10-CM | POA: Diagnosis not present

## 2020-08-01 LAB — BASIC METABOLIC PANEL
Anion gap: 8 (ref 5–15)
BUN: 23 mg/dL (ref 8–23)
CO2: 30 mmol/L (ref 22–32)
Calcium: 9.1 mg/dL (ref 8.9–10.3)
Chloride: 100 mmol/L (ref 98–111)
Creatinine, Ser: 1.29 mg/dL — ABNORMAL HIGH (ref 0.44–1.00)
GFR, Estimated: 41 mL/min — ABNORMAL LOW (ref >60)
Glucose, Bld: 87 mg/dL (ref 70–99)
Potassium: 3.8 mmol/L (ref 3.5–5.1)
Sodium: 138 mmol/L (ref 135–145)

## 2020-08-01 MED ORDER — LOSARTAN POTASSIUM 25 MG PO TABS: 12.5000 mg | ORAL_TABLET | Freq: Every day | ORAL | 3 refills | Status: DC

## 2020-08-01 NOTE — Progress Notes (Signed)
PCP: Lesleigh Noe, MD Cardiology: Dr. Rockey Situ HF Cardiology: Dr. [name]  Reason for Visit: F/u for Chronic Diastolic Heart Failure and Hypertension   87 y.o. with history of permanent atrial fibrillation, chronic diastolic CHF, CKD stage 3, smoking/prior COPD, renal cell CA s/p right nephrectomy.  She has a prior history of HFrEF with nonischemic cardiomyoapthy and had a Medtronic ICD placed.  However, subsequently her EF has recovered.  More recently, she has had symptomatic HFpEF.    She has had dyspnea and edema for "a long time," worse starting 1/20. In 6/20, she had left hip ORIF after mechanical fall.  Echo in 6/20 showed EF 60-65%, mild RV dilation with normal systolic function, PASP 93 mmHg.  After she got home from the hospital stay post-ORIF, she noted marked peripheral edema.  She was short of breath walking short distances.  +orthopnea.  She came back to the hospital on 11/04/18 and was admitted with volume overload.  She was diuresed aggressively and lost about 22 lbs.  RHC showed pulmonary venous hypertension.  PCWP tracing had prominent v-waves in absence of significant MR, suggesting significant diastolic dysfunction.   She has been noted to have significant Fe deficiency anemia, but FOBT was negative during 7/20 hospitalization.   She was admitted in 11/20 with symptomatic anemia, GI workup concerning for ischemic bowel.  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 Doren Custard with [facility] and conservative management recommended.  Capsule endoscopy showed no definite source of bleeding.  She was volume overloaded due to significant RV dysfunction and diuresed, she also developed AKI on CKD stage 3.      She returned for follow up of CHF on 10/21.  Breathing is stable, short of breath walking longer distances or up inclines.  No orthopnea/PND.  No chest pain.  She is "dizzy all the time."  Further questioning suggests that this seems to be more of a balance issue than  lightheadedness, orthostatics done today were negative.  Weight is up 6 lbs.  Recently, we had cut back on her metolazone from 3 times a week to currently once a week. Metolazone was continued Monday and Thursday, repeat echo ordered.  Seen last visit 2/22 and was felt to be mildly hypovolemic and complained of dizziness. No dyspnea. Torsemide was reduced down from 100 mg bid to 100 mg qam/ 80 mg qpm. Her BP was also mildly elevated. She was instructed to monitor at home and to bring a log of readings to her next appt.   Earlier this month, she was seen by PCP and started on abx for LE cellulitis.   She returns now to the [facility] for f/u. Here w/ daughter. Doing fairly well. Finished abx. Cellulitis resolved. No fever or chills. Feels well from cardiac standpoint. No significant dyspnea w/ ADLs. Wt has been stable. Volume status good based on Optivol and CardioMems reading. Fluid index/ impendence c/w euvolemia. CardioMems dPAP goal is 23 and she has consistently been either at or below goal. No AF nor VT/VF on device interrogation. BP is elevated in clinic today at 158/62. Pulse rate 63 bpm. I have reviewed her home BP readings. Average SBP 140s-low 150s.    Medtronic device interrogation: OptiVol fluid well below threshold, thoracic impedence at threshold, no VT/VF (personally reviewed).   Cardiomems PADP: 20, goal 23  Labs (7/20): K 3.7, creatinine 1.44 => 1.33 Labs (12/20): pro-BNP 431, K 4.4, creatinine 1.24, hgb 8.2 Labs (10/21): K 4, creatinine 1.83 Labs (12/21): K  3.5, creatinine 1.45 Labs (2/22): K 5.8 (Lokelma ordered), creatinine 1.39 Labs (3/22): K 4.4  Creatinine 1.42  PMH: 1. COPD 2. Renal cell carcinoma: S/p right kidney resection.  3. CKD stage 3 4. Fe deficiency anemia 5. Atrial fibrillation: Permanent. She has failed amiodarone, Tikosyn, and flecainide as well as multiple cardioversions. 6. Chronic diastolic CHF: She has history of prior HFrEF with nonischemic cardiomyopathy.   She has a Medtronic ICD.   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  - More recently, EF has been in normal range but she has had diastolic CHF.  - Echo (4/58): EF 60-65%, mildly dilated RV with PASP 93 mmHg, mild-moderate TR.  - RHC (7/20): mean RA 11, PA 64/18 mean 39, mean PCWP 24 with v waves to 41, CI 4.21, PVR 1.9 WU.  - Cardiomems placement - RHC (11/20): mean RA 13, PA 63/23 mean 25 with v waves to 47 (MR only mild by echo), mean PCWP 25, CI 3.18, PVR 2.6 WU - Echo (11/21): EF 65-70%, moderate LVH, RVSP 85.9 mmHg, severe RAE, severe TR, mod-sev AS, 7. Hyperlipidemia 8. Carotid stenosis: Carotid dopplers (0/99) with 83-38% LICA stenosis.   9. Possible ischemic bowel in 11/20 10. 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 Conservative management per Dr. Doren Custard.   Social History   Socioeconomic History  . Marital status: Widowed    Spouse name: Not on file  . Number of children: 2  . Years of education: high school  . Highest education level: Not on file  Occupational History  . Occupation: Retired    Fish farm manager: RETIRED  Tobacco Use  . Smoking status: Former Smoker    Packs/day: 0.50    Years: 40.00    Pack years: 20.00    Types: Cigarettes    Quit date: 05/06/2001    Years since quitting: 19.2  . Smokeless tobacco: Never Used  Vaping Use  . Vaping Use: Never used  Substance and Sexual Activity  . Alcohol use: No    Alcohol/week: 0.0 standard drinks  . Drug use: No  . Sexual activity: Not Currently  Other Topics Concern  . Not on file  Social History Narrative   Would desire CPR   07/11/20   From: the area   Living: with [place] (2003)   Work: retired - clerical      Family: 2 children - [name] and [place] - 2 grand chlidren - 5 great grand children      Enjoys: spending time with friends - watch move, eat out, spend time      Exercise: walking around the house   Diet: not good, limits red meat, limits salt, low  appetite      Safety   Seat belts: Yes    Guns: Yes  and secure   Safe in relationships: Yes    Social Determinants of Health   Financial Resource Strain: Not on file  Food Insecurity: Not on file  Transportation Needs: Not on file  Physical Activity: Not on file  Stress: Not on file  Social Connections: Not on file  Intimate Partner Violence: Not on file   Family History  Problem Relation Age of Onset  . Heart failure Mother   . Early death Father        car accident  . Breast cancer Maternal Aunt 82  . Breast cancer Cousin   . Breast cancer Other   . Colon cancer Neg Hx    ROS:  All systems reviewed and negative except as per HPI.   Current Outpatient Medications  Medication Sig Dispense Refill  . acetaminophen (TYLENOL) 500 MG tablet Take 500-1,000 mg by mouth every 8 (eight) hours as needed for mild pain or headache.     Marland Kitchen apixaban (ELIQUIS) 2.5 MG TABS tablet Take 1 tablet (2.5 mg total) by mouth 2 (two) times daily. Dispense starter pack 60 tablet 11  . cetirizine (ZYRTEC) 10 MG tablet Take 10 mg by mouth daily.    . Cholecalciferol (VITAMIN D) 50 MCG (2000 UT) CAPS Take 4,000 Units by mouth daily.    . Cranberry (AZO CRANBERRY GUMMIES) 500 MG CHEW Chew 2,000 mg by mouth daily.    . Cyanocobalamin (B-12 PO) Take 1 tablet by mouth daily.     Marland Kitchen ezetimibe (ZETIA) 10 MG tablet Take 1 tablet (10 mg total) by mouth daily. 30 tablet 11  . ferrous sulfate 325 (65 FE) MG tablet Take 1 tablet (325 mg total) by mouth 2 (two) times daily with a meal. 60 tablet 0  . fluticasone (FLONASE) 50 MCG/ACT nasal spray Place 2 sprays into both nostrils daily as needed for allergies or rhinitis. 16 g 6  . levothyroxine (SYNTHROID) 25 MCG tablet TAKE 1 TABLET BY MOUTH ONCE DAILY BEFOREBREAKFAST 30 tablet 3  . magnesium oxide (MAG-OX) 400 MG tablet Take 1 tablet (400 mg total) by mouth daily. 30 tablet 2  . metolazone (ZAROXOLYN) 2.5 MG tablet Take 2.5 mg by mouth as needed.    . ondansetron  (ZOFRAN) 4 MG tablet Take 1 tablet (4 mg total) by mouth every 8 (eight) hours as needed for nausea or vomiting. 20 tablet 1  . pantoprazole (PROTONIX) 40 MG tablet Take 1 tablet (40 mg total) by mouth daily at 6 (six) AM. 30 tablet 0  . potassium chloride SA (KLOR-CON M20) 20 MEQ tablet Take 2 tablets (40 mEq total) by mouth 2 (two) times daily. Take 1 extra tablet on the days you take metolazone. 360 tablet 3  . rosuvastatin (CRESTOR) 40 MG tablet Take 1 tablet (40 mg total) by mouth daily. 90 tablet 3  . torsemide (DEMADEX) 100 MG tablet Take 1 tablet (100 mg total) by mouth in the morning. To be taken with 80 mg in the PM 30 tablet 3  . torsemide (DEMADEX) 20 MG tablet Take 4 tablets (80 mg total) by mouth every evening. To be taken with 100 mg tab in the AM 120 tablet 0  . vitamin C (ASCORBIC ACID) 250 MG tablet Take 500 mg by mouth daily.     No current facility-administered medications for this encounter.    Wt Readings from Last 3 Encounters:  08/01/20 66.6 kg (146 lb 12.8 oz)  07/24/20 65.9 kg (145 lb 4 oz)  07/13/20 66.7 kg (147 lb)    BP (!) 158/62   Pulse 65   Wt 66.6 kg (146 lb 12.8 oz)   SpO2 97%   BMI 23.34 kg/m    PHYSICAL EXAM: General:  Well appearing. No respiratory difficulty HEENT: normal Neck: supple. no JVD. Carotids 2+ bilat; no bruits. No lymphadenopathy or thyromegaly appreciated. Cor: PMI nondisplaced. Regular rate & rhythm. No rubs, gallops or murmurs. Lungs: clear Abdomen: soft, nontender, nondistended. No hepatosplenomegaly. No bruits or masses. Good bowel sounds. Extremities: no cyanosis, clubbing, rash, edema, bilateral varicose veins  Neuro: alert & oriented x 3, cranial nerves grossly intact. moves all 4 extremities w/o difficulty. Affect pleasant.   Assessment/Plan: 1. Chronic diastolic CHF:  Echo in 6/20 showed EF 60-65%, mild RV dilation with normal systolic function, PASP 93 mmHg. She had remote nonischemic cardiomyopathy with recovery of EF,  has Medtronic ICD. I suspect that there is significant RV dysfunction. Atrial fibrillation likely contributes, but this appears to be permanent now as she has failed multiple anti-arrhythmics and is reasonably rate-controlled. [place] 11/20 showed elevated filling pressures and pulmonary venous hypertension.  Prominent v-waves on PCWP tracing likely due to stiff ventricle/diastolic dysfunction as she had only mild MR on echo. Echo (11/21): EF 65-70%, moderate LVH, RVSP 85.9 mmHg, severe RAE, severe TR, mod-sev AS. NYHA Class II-III, confounded by old age. She has Cardiomems.  Volume status stable by Cardiomems and by Optivol, euvolemic - Continue Torsemide 100 mg qam/ 80 mg qpm - Continue Metolazone PRN 2. Hypertension  - BP at elevated at 158/62, similar readings at home  - Add Losartan 12.5 mg daily. BMP today and again in 7 days 3. Pulmonary hypertension: PA systolic pressure estimated 85.9 mmHg on last echo. Based on [place] 11/20, she has primarily pulmonary venous hypertension.  4. CKD: Stage 3. Last SCr 1.4 - BMET today and again in 7 days w/ addition of losartan. If K borderline high, can reduce KCl dose   5. Anemia: Fe deficiency. She denies overt bleeding.  Capsule endoscopy during 11/20 admission did not show bleeding source. Recent CBC 3/21 w/ stable hgb at 11.8  6. Atrial fibrillation: Permanent now. Rate control is reasonable. She has failed amiodarone, Tikosyn, and flecainide as well as multiple cardioversions.  - Continue warfarin.    F/u in 3 months with Dr. Esmeralda Links  PA-C  08/01/2020

## 2020-08-01 NOTE — Patient Instructions (Signed)
START Losartan 12.5 mg one half tab daily  Labs today We will only contact you if something comes back abnormal or we need to make some changes. Otherwise no news is good news!   Labs needed in 7-10 days  Keep follow up with Dr [name] in June 2022  Do the following things EVERYDAY: 1) Weigh yourself in the morning before breakfast. Write it down and keep it in a log. 2) Take your medicines as prescribed 3) Eat low salt foods--Limit salt (sodium) to 2000 mg per day.  4) Stay as active as you can everyday 5) Limit all fluids for the day to less than 2 liters  At the [facility], you and your health needs are our priority. As part of our continuing mission to provide you with exceptional heart care, we have created designated Provider Care Teams. These Care Teams include your primary Cardiologist (physician) and Advanced Practice Providers (APPs- Physician Assistants and Nurse Practitioners) who all work together to provide you with the care you need, when you need it.   You may see any of the following providers on your designated Care Team at your next follow up: Marland Kitchen Dr Glori Bickers . Dr Loralie Champagne . Dr Vickki Muff . Darrick Grinder, NP . Lyda Jester, [place] . Audry Riles, PharmD   Please be sure to bring in all your medications bottles to every appointment.   If you have any questions or concerns before your next appointment please send [place] a message through [place] or call our office at [phone].    TO LEAVE A MESSAGE FOR THE NURSE SELECT OPTION 2, PLEASE LEAVE A MESSAGE INCLUDING: . YOUR NAME . DATE OF BIRTH . CALL BACK NUMBER . REASON FOR CALL**this is important as we prioritize the call backs  YOU WILL RECEIVE A CALL BACK THE SAME DAY AS LONG AS YOU CALL BEFORE 4:00 PM  Please see our updated No Show and Same Day Appointment Cancellation Policy attached to your AVS.

## 2020-08-03 ENCOUNTER — Encounter (HOSPITAL_COMMUNITY): Payer: PPO

## 2020-09-18 ENCOUNTER — Other Ambulatory Visit: Payer: Self-pay | Admitting: Family Medicine

## 2020-09-18 DIAGNOSIS — Z1231 Encounter for screening mammogram for malignant neoplasm of breast: Secondary | ICD-10-CM

## 2020-10-05 ENCOUNTER — Ambulatory Visit (INDEPENDENT_AMBULATORY_CARE_PROVIDER_SITE_OTHER): Payer: PPO | Admitting: Family Medicine

## 2020-10-05 ENCOUNTER — Other Ambulatory Visit: Payer: Self-pay

## 2020-10-05 ENCOUNTER — Encounter: Payer: Self-pay | Admitting: Family Medicine

## 2020-10-05 VITALS — BP 128/70 | HR 70 | Temp 99.7°F | Wt 149.8 lb

## 2020-10-05 DIAGNOSIS — Z20822 Contact with and (suspected) exposure to covid-19: Secondary | ICD-10-CM | POA: Diagnosis not present

## 2020-10-05 DIAGNOSIS — L03116 Cellulitis of left lower limb: Secondary | ICD-10-CM

## 2020-10-05 DIAGNOSIS — R519 Headache, unspecified: Secondary | ICD-10-CM | POA: Insufficient documentation

## 2020-10-05 MED ORDER — DOXYCYCLINE HYCLATE 100 MG PO TABS: 100.0000 mg | ORAL_TABLET | Freq: Two times a day (BID) | ORAL | 0 refills | Status: AC

## 2020-10-05 NOTE — Assessment & Plan Note (Signed)
Suspect this may be the source of her low grade fever. Treat with Abx. Advise dermatology f/u - has appt next month

## 2020-10-05 NOTE — Progress Notes (Signed)
Subjective:     [name] is a 87 y.o. female presenting for Headache (X 1 week ) and Fatigue     HPI   #Fatigue/HA - started one week ago - on the left side of the head - temporal area - feels like a lightening flash - will come and go quickly - very sharp - will linger near the ear - baseline fatigue - off and on for a few years   #Skin lesion - has returned and canceled her dermatology appointment since it was getting better  Review of Systems  Constitutional: Positive for fatigue. Negative for chills and fever.  HENT: Positive for congestion (chronic). Negative for ear pain, hearing loss, sinus pressure, sinus pain, sneezing, sore throat and tinnitus.   Eyes: Positive for discharge (watery). Negative for pain and itching.  Respiratory: Positive for cough (chronic). Negative for shortness of breath.   Cardiovascular: Negative for chest pain.  Gastrointestinal: Negative for constipation, diarrhea, nausea and vomiting.  Musculoskeletal: Positive for neck pain (chronic). Negative for arthralgias and myalgias.  Neurological: Positive for light-headedness (chronic) and headaches. Negative for numbness.     Social History   Tobacco Use  Smoking Status Former Smoker  . Packs/day: 0.50  . Years: 40.00  . Pack years: 20.00  . Types: Cigarettes  . Quit date: 05/06/2001  . Years since quitting: 19.4  Smokeless Tobacco Never Used        Objective:    BP Readings from Last 3 Encounters:  10/05/20 128/70  08/01/20 (!) 158/62  07/24/20 138/60   Wt Readings from Last 3 Encounters:  10/05/20 149 lb 12 oz (67.9 kg)  08/01/20 146 lb 12.8 oz (66.6 kg)  07/24/20 145 lb 4 oz (65.9 kg)    BP 128/70   Pulse 70   Temp 99.7 F (37.6 C) (Temporal)   Wt 149 lb 12 oz (67.9 kg)   SpO2 98%   BMI 23.81 kg/m    Physical Exam Constitutional:      General: She is not in acute distress.    Appearance: She is well-developed. She is not diaphoretic.  HENT:      Head: Normocephalic and atraumatic.     Comments: TTP over the left temple/paratial area above the ear    Right Ear: Tympanic membrane and ear canal normal.     Left Ear: Tympanic membrane and ear canal normal.     Nose: Mucosal edema and rhinorrhea present.     Right Sinus: No maxillary sinus tenderness or frontal sinus tenderness.     Left Sinus: No maxillary sinus tenderness or frontal sinus tenderness.     Mouth/Throat:     Pharynx: Uvula midline. Posterior oropharyngeal erythema present. No oropharyngeal exudate.     Tonsils: 0 on the right. 0 on the left.  Eyes:     General: No scleral icterus.    Conjunctiva/sclera: Conjunctivae normal.     Pupils: Pupils are equal, round, and reactive to light.  Cardiovascular:     Rate and Rhythm: Normal rate and regular rhythm.     Heart sounds: Normal heart sounds. No murmur heard.   Pulmonary:     Effort: Pulmonary effort is normal. No respiratory distress.     Breath sounds: Normal breath sounds.  Musculoskeletal:     Cervical back: Neck supple. No rigidity.  Lymphadenopathy:     Cervical: No cervical adenopathy.  Skin:    General: Skin is warm and dry.     Capillary Refill:  Capillary refill takes less than 2 seconds.     Comments: Left foot with 2 poorly healing scabbed lesions surrounded by erythema, warm and ttp  Neurological:     Mental Status: She is alert.     Cranial Nerves: No cranial nerve deficit or facial asymmetry.     Sensory: No sensory deficit.     Motor: No weakness.     Deep Tendon Reflexes: Reflexes normal.  Psychiatric:        Mood and Affect: Mood normal.        Behavior: Behavior normal.           Assessment & Plan:   Problem List Items Addressed This Visit      Other   Cellulitis of left lower extremity - Primary    Suspect this may be the source of her low grade fever. Treat with Abx. Advise dermatology f/u - has appt next month      Relevant Medications   doxycycline (VIBRA-TABS) 100 MG  tablet   Acute nonintractable headache    Etiology unclear. Given onset with fatigue and low grade fever will r/o covid. Describes some shooting symptoms ?trigeminal pain. Unilateral ?giant cell arteritis but in setting of cellulitis and mild symptoms concerning blood work could cloud diagnosis. Will watch and wait. F/u covid test. If not improving after cellulitis treatment will get inflammatory markers and consider steroids.        Other Visit Diagnoses    Suspected COVID-19 virus infection       Relevant Orders   Novel Coronavirus, NAA ([facility])       Return if symptoms worsen or fail to improve.  Lesleigh Noe, MD  This visit occurred during the SARS-CoV-2 public health emergency.  Safety protocols were in place, including screening questions prior to the visit, additional usage of staff PPE, and extensive cleaning of exam room while observing appropriate contact time as indicated for disinfecting solutions.

## 2020-10-05 NOTE — Patient Instructions (Addendum)
#  Skin infection - antibiotics - see dermatology  #Headache and fever - covid test - if not improving in the next week call and will plan for blood work on Monday or Tuesday  Isolate until your covid tests are back

## 2020-10-05 NOTE — Assessment & Plan Note (Signed)
Etiology unclear. Given onset with fatigue and low grade fever will r/o covid. Describes some shooting symptoms ?trigeminal pain. Unilateral ?giant cell arteritis but in setting of cellulitis and mild symptoms concerning blood work could cloud diagnosis. Will watch and wait. F/u covid test. If not improving after cellulitis treatment will get inflammatory markers and consider steroids.

## 2020-10-06 LAB — SARS-COV-2, NAA 2 DAY TAT

## 2020-10-06 LAB — NOVEL CORONAVIRUS, NAA: SARS-CoV-2, NAA: NOT DETECTED

## 2020-10-09 ENCOUNTER — Ambulatory Visit
Admission: RE | Admit: 2020-10-09 | Discharge: 2020-10-09 | Disposition: A | Discharge status: Home / Self Care | Payer: PPO | Source: Ambulatory Visit | Attending: Family Medicine | Admitting: Family Medicine

## 2020-10-09 ENCOUNTER — Other Ambulatory Visit: Payer: Self-pay

## 2020-10-09 DIAGNOSIS — Z1231 Encounter for screening mammogram for malignant neoplasm of breast: Secondary | ICD-10-CM | POA: Diagnosis not present

## 2020-10-20 ENCOUNTER — Encounter (HOSPITAL_COMMUNITY): Payer: PPO | Admitting: Cardiology

## 2020-11-09 ENCOUNTER — Other Ambulatory Visit: Payer: Self-pay

## 2020-11-09 ENCOUNTER — Ambulatory Visit: Payer: PPO | Admitting: Dermatology

## 2020-11-09 DIAGNOSIS — C44729 Squamous cell carcinoma of skin of left lower limb, including hip: Secondary | ICD-10-CM | POA: Diagnosis not present

## 2020-11-09 DIAGNOSIS — L578 Other skin changes due to chronic exposure to nonionizing radiation: Secondary | ICD-10-CM | POA: Diagnosis not present

## 2020-11-09 DIAGNOSIS — L820 Inflamed seborrheic keratosis: Secondary | ICD-10-CM | POA: Diagnosis not present

## 2020-11-09 DIAGNOSIS — L821 Other seborrheic keratosis: Secondary | ICD-10-CM

## 2020-11-09 DIAGNOSIS — L570 Actinic keratosis: Secondary | ICD-10-CM

## 2020-11-09 DIAGNOSIS — L720 Epidermal cyst: Secondary | ICD-10-CM

## 2020-11-09 DIAGNOSIS — D485 Neoplasm of uncertain behavior of skin: Secondary | ICD-10-CM

## 2020-11-09 NOTE — Progress Notes (Signed)
New Patient Visit  Subjective  [name] is a 87 y.o. female who presents for the following: Other (Spots of left lat foot, abdomen and face that are irritating. She has a history of cellulitis/). Of foot, treated with doxycycline twice. Also spot on L arm that peels off and comes back and is tender to touch.    The following portions of the chart were reviewed this encounter and updated as appropriate:        Review of Systems:  No other skin or systemic complaints except as noted in HPI or Assessment and Plan.  Objective  Well appearing patient in no apparent distress; mood and affect are within normal limits.  A focused examination was performed including face, abdomen, arms, legs. Relevant physical exam findings are noted in the Assessment and Plan.  Left lat foot 1.0 cm tender keratotic papule   Left 2nd toe, left dorsum hand, left lat foot (3) Keratotic papules  Forehead, Lower abdomen White firm papules of forehead. Firm SQ nodule with large punctum lower abdomen.  Right lower abdomen Stuck-on, waxy, tan-brown papule or plaque --Discussed benign etiology and prognosis.   Left Forearm - Anterior Erythematous keratotic or waxy stuck-on papule   Assessment & Plan   Actinic Damage - chronic, secondary to cumulative UV radiation exposure/sun exposure over time - diffuse scaly erythematous macules with underlying dyspigmentation - Recommend daily broad spectrum sunscreen SPF 30+ to sun-exposed areas, reapply every 2 hours as needed.  - Recommend staying in the shade or wearing long sleeves, sun glasses (UVA+UVB protection) and wide brim hats (4-inch brim around the entire circumference of the hat). - Call for new or changing lesions.  Neoplasm of uncertain behavior of skin Left lat foot  Skin / nail biopsy Type of biopsy: tangential   Informed consent: discussed and consent obtained   Patient was prepped and draped in usual sterile fashion: Area prepped  with alcohol. Anesthesia: the lesion was anesthetized in a standard fashion   Anesthetic:  0.5% bupivicaine w/ epinephrine 1-100,000 local infiltration Instrument used: flexible razor blade   Hemostasis achieved with: pressure, aluminum chloride and electrodesiccation   Outcome: patient tolerated procedure well   Post-procedure details: wound care instructions given   Post-procedure details comment:  Ointment and small bandage applied  Specimen 1 - Surgical pathology Differential Diagnosis: Hyperkeratotic AK vs ISK R/O SCC Check Margins: No  Rec EDC if skin cancer  AK (actinic keratosis) (3) Left 2nd toe, left dorsum hand, left lat foot  Hypertrophic, Recheck on f/up  Actinic keratoses are precancerous spots that appear secondary to cumulative UV radiation exposure/sun exposure over time. They are chronic with expected duration over 1 year. A portion of actinic keratoses will progress to squamous cell carcinoma of the skin. It is not possible to reliably predict which spots will progress to skin cancer and so treatment is recommended to prevent development of skin cancer.  Recommend daily broad spectrum sunscreen SPF 30+ to sun-exposed areas, reapply every 2 hours as needed.  Recommend staying in the shade or wearing long sleeves, sun glasses (UVA+UVB protection) and wide brim hats (4-inch brim around the entire circumference of the hat). Call for new or changing lesions.  Destruction of lesion - Left 2nd toe, left dorsum hand, left lat foot  Destruction method: cryotherapy   Informed consent: discussed and consent obtained   Lesion destroyed using liquid nitrogen: Yes   Region frozen until ice ball extended beyond lesion: Yes   Outcome: patient tolerated  procedure well with no complications   Post-procedure details: wound care instructions given   Additional details:  Prior to procedure, discussed risks of blister formation, small wound, skin dyspigmentation, or rare scar following  cryotherapy. Recommend Vaseline ointment to treated areas while healing.   Epidermal inclusion cyst (2) Lower abdomen; Forehead  Benign-appearing. Exam most consistent with an epidermal inclusion cyst. Discussed that a cyst is a benign growth that can grow over time and sometimes get irritated or inflamed. Recommend observation if it is not bothersome. Discussed option of surgical excision to remove it if it is growing, symptomatic, or other changes noted. Please call for new or changing lesions so they can be evaluated.  Cyst with symptoms and/or recent change on abdomen.  Discussed surgical excision to remove, including resulting scar and possible recurrence.  Patient will schedule for surgery. Pre-op information given.     Seborrheic keratosis Right lower abdomen  Reassured benign age-related growth.  Recommend observation.  Discussed cryotherapy if spot(s) become irritated or inflamed.  Inflamed seborrheic keratosis Left Forearm - Anterior  Destruction of lesion - Left Forearm - Anterior  Destruction method: cryotherapy   Informed consent: discussed and consent obtained   Lesion destroyed using liquid nitrogen: Yes   Region frozen until ice ball extended beyond lesion: Yes   Outcome: patient tolerated procedure well with no complications   Post-procedure details: wound care instructions given   Additional details:  Prior to procedure, discussed risks of blister formation, small wound, skin dyspigmentation, or rare scar following cryotherapy. Recommend Vaseline ointment to treated areas while healing.   Return for Surgery cyst of abdomen.  I, Ashok Cordia, CMA, am acting as scribe for Brendolyn Patty, MD .  Documentation: I have reviewed the above documentation for accuracy and completeness, and I agree with the above.  Brendolyn Patty MD

## 2020-11-09 NOTE — Patient Instructions (Addendum)
Cryotherapy Aftercare  Wash gently with soap and water everyday.   Apply Vaseline and Band-Aid daily until healed.    Wound Care Instructions  Cleanse wound gently with soap and water once a day then pat dry with clean gauze. Apply a thing coat of Petrolatum (petroleum jelly, "Vaseline") over the wound (unless you have an allergy to this). We recommend that you use a new, sterile tube of Vaseline. Do not pick or remove scabs. Do not remove the yellow or white "healing tissue" from the base of the wound.  Cover the wound with fresh, clean, nonstick gauze and secure with paper tape. You may use Band-Aids in place of gauze and tape if the would is small enough, but would recommend trimming much of the tape off as there is often too much. Sometimes Band-Aids can irritate the skin.  You should call the office for your biopsy report after 1 week if you have not already been contacted.  If you experience any problems, such as abnormal amounts of bleeding, swelling, significant bruising, significant pain, or evidence of infection, please call the office immediately.  FOR ADULT SURGERY PATIENTS: If you need something for pain relief you may take 1 extra strength Tylenol (acetaminophen) AND 2 Ibuprofen (200mg each) together every 4 hours as needed for pain. (do not take these if you are allergic to them or if you have a reason you should not take them.) Typically, you may only need pain medication for 1 to 3 days.     If you have any questions or concerns for your doctor, please call our main line at [phone] and press option 4 to reach your doctor's medical assistant. If no one answers, please leave a voicemail as directed and we will return your call as soon as possible. Messages left after 4 pm will be answered the following business day.   You may also send us a message via MyChart. We typically respond to MyChart messages within 1-2 business days.  For prescription refills, please ask your  pharmacy to contact our office. Our fax number is [phone].  If you have an urgent issue when the clinic is closed that cannot wait until the next business day, you can page your doctor at the number below.    Please note that while we do our best to be available for urgent issues outside of office hours, we are not available 24/7.   If you have an urgent issue and are unable to reach us, you may choose to seek medical care at your doctor's office, retail clinic, urgent care center, or emergency room.  If you have a medical emergency, please immediately call 911 or go to the emergency department.  Pager Numbers  - Dr. Kowalski: [phone]  - Dr. Moye: [phone]  - Dr. Stewart: [phone]  In the event of inclement weather, please call our main line at [phone] for an update on the status of any delays or closures.  Dermatology Medication Tips: Please keep the boxes that topical medications come in in order to help keep track of the instructions about where and how to use these. Pharmacies typically print the medication instructions only on the boxes and not directly on the medication tubes.   If your medication is too expensive, please contact our office at [phone] option 4 or send us a message through MyChart.   We are unable to tell what your co-pay for medications will be in advance as this is different depending on your insurance coverage.   However, we may be able to find a substitute medication at lower cost or fill out paperwork to get insurance to cover a needed medication.   If a prior authorization is required to get your medication covered by your insurance company, please allow [place] 1-2 business days to complete this process.  Drug prices often vary depending on where the prescription is filled and some pharmacies may offer cheaper prices.  The website www.goodrx.com contains coupons for medications through different pharmacies. The prices here do not  account for what the cost may be with help from insurance (it may be cheaper with your insurance), but the website can give you the price if you did not use any insurance.  - You can print the associated coupon and take it with your prescription to the pharmacy.  - You may also stop by our office during regular business hours and pick up a GoodRx coupon card.  - If you need your prescription sent electronically to a different pharmacy, notify our office through [facility] or by phone at [phone] option 4.   [place] [address]   You have an upcoming surgery appointment scheduled at [facility]. @FUTURESURGAPPT @  PRE-OPERATIVE INSTRUCTIONS  We recommend you read the following instructions. If you have any questions or concerns, please call the office at [phone].  Shower and wash the entire body with soap and water the day of your surgery paying special attention to cleansing at and around the planned surgery site. Avoid aspirin or aspirin containing products at least ten (10) days prior to your surgical procedure and for at least one week (7 days) after your surgical procedure. If you take aspirin on a regular basis for heart disease or history of stroke or for any other reason, we may recommend you continue taking aspirin but please notify us if you take this on a regular basis. Aspirin can cause more bleeding to occur during surgery as well as prolonged bleeding and bruising after surgery. Avoid other nonsteroidal pain medications at least one week prior to surgery and at least one week after your surgery. These include medications such as Ibuprofen (Motrin, Advil and Nuprin), Naprosyn, Voltaren, Relafen, etc. If these medications are used for therapeutic reasons, please inform us as they can cause increased bleeding or prolonged bleeding during and bruising after surgical procedures.  Please advise [place] if you are taking any "blood  thinner" medications such as Coumadin or Dipyridamole or Plavix or similar medications. These cause increased bleeding and prolonged bleeding during procedures and bruising after surgical procedures. We may have to consider discontinuing these medications briefly prior to and shortly after your surgery if safe to do so. Please inform us of all medications you are currently taking. All medications that are taken regularly should be taken the day of surgery as you always do. Nevertheless, we need to be informed of what medications you are taking prior to surgery to know whether they will affect the procedure or cause any complications. Please inform us of any medication allergies. Also inform us of whether you have allergies to Latex or rubber products or whether you have had any adverse reaction to Lidocaine or Epinephrine. Please inform us of any prosthetic or artificial body parts such as artificial heart valve, joint replacements, etc., or similar condition that might require preoperative antibiotics. We recommend avoidance of alcohol at least two weeks prior to surgery and continued avoidance for at least two weeks after surgery. We  recommend avoidance of tobacco smoking at least two weeks prior to surgery and continued abstinence for at least two weeks after surgery. Do not plan strenuous exercise, strenuous work or strenuous lifting for approximately four weeks after your surgery. We request if you are unable to make your scheduled surgical appointment, please call us at least a week in advance or as soon as you are aware of a problem so that we can cancel or reschedule the appointment. You MAY TAKE TYLENOL (acetaminophen) for pain as it is not a blood thinner. PLEASE PLAN TO BE IN TOWN FOR TWO WEEKS FOLLOWING SURGERY. THIS IS IMPORTANT SO YOU CAN BE CHECKED FOR DRESSING CHANGES, SUTURE REMOVAL AND TO MONITOR FOR POSSIBLE COMPLICATIONS.

## 2020-11-16 ENCOUNTER — Encounter: Payer: Self-pay | Admitting: Dermatology

## 2020-11-16 ENCOUNTER — Telehealth: Payer: Self-pay

## 2020-11-16 NOTE — Telephone Encounter (Signed)
-----   Message from Brendolyn Patty, MD sent at 11/16/2020  2:59 PM EDT ----- Skin , left lat foot MODERATELY DIFFERENTIATED SQUAMOUS CELL CARCINOMA, ADENOID VARIANT  SCC skin cancer- needs EDC - please call pt

## 2020-11-16 NOTE — Telephone Encounter (Signed)
Tried to call patient with results but no answer and no voicemail/hd

## 2020-11-22 ENCOUNTER — Telehealth: Payer: Self-pay

## 2020-11-22 NOTE — Telephone Encounter (Signed)
-----   Message from Brendolyn Patty, MD sent at 11/16/2020  2:59 PM EDT ----- Skin , left lat foot MODERATELY DIFFERENTIATED SQUAMOUS CELL CARCINOMA, ADENOID VARIANT  SCC skin cancer- needs EDC - please call pt

## 2020-11-22 NOTE — Telephone Encounter (Signed)
Advised pt of bx results.  Pt is scheduled 12/25/20 and we will txt the [facility] with EDC at that appointment.  Pt is on Eliquis and asked if she needed to d/c.  Advised pt she didn't have to d/c the Eliquis for [facility] treatment./sh

## 2020-11-28 ENCOUNTER — Telehealth: Payer: Self-pay

## 2020-11-28 NOTE — Telephone Encounter (Signed)
-----   Message from Brendolyn Patty, MD sent at 11/16/2020  2:59 PM EDT ----- Skin , left lat foot MODERATELY DIFFERENTIATED SQUAMOUS CELL CARCINOMA, ADENOID VARIANT  SCC skin cancer- needs EDC - please call pt

## 2020-11-28 NOTE — Telephone Encounter (Signed)
Advised patient of bx results. Scheduled for EDC on 01/01/21 at 11:45am.

## 2020-12-04 ENCOUNTER — Encounter (HOSPITAL_COMMUNITY): Payer: PPO | Admitting: Cardiology

## 2020-12-04 ENCOUNTER — Telehealth: Payer: Self-pay

## 2020-12-04 NOTE — Telephone Encounter (Signed)
Pt called for appt and pt already has appt scheduled with Dr Einar Pheasant on 12/05/20 at 12 noon. Pt said she has had the lightheadedness on and off for few days. Pt cancelled appt with card today due to not being able drive and has no one else to take her to appt. Pt not having any CP,SOB, H/A or vision changes. BP now is 138/63 P60. Pt said usually has lightheadedness when up moving around. Pt is not lightheaded right now;  UC & ED precautions given and pt voiced understanding.pt plans to keep appt with Dr Einar Pheasant on 12/05/20 at 12 noon.

## 2020-12-04 NOTE — Telephone Encounter (Signed)
Please see note below access nurse note. 

## 2020-12-04 NOTE — Telephone Encounter (Signed)
Noted will see tomorrow 

## 2020-12-04 NOTE — Telephone Encounter (Signed)
[facility] - Client TELEPHONE ADVICE RECORD [facility] Patient Name: [name] Gender: Female DOB: 08-02-1933 Age: 87 Y 25 M 27 D Return Phone Number: [phone] (Primary), [phone] (Secondary) Address: [address] City/ State/ Zip: [place] [address] Client [facility] - Client Client Site [place] - Day Physician Waunita Schooner- MD Contact Type Call Who Is Calling Patient / Member / Family / Caregiver Call Type Triage / Clinical Relationship To Patient Self Return Phone Number [phone] (Primary) Chief Complaint Dizziness Reason for Call Symptomatic / Request for Health Information Initial Comment Caller has dizziness and is lethargic. Caller asking for appointment. Translation No Nurse Assessment Nurse: Ysidro Evert, RN, Levada Dy Date/Time (Eastern Time): 12/04/2020 1:58:56 PM Confirm and document reason for call. If symptomatic, describe symptoms. ---Caller states she is having dizziness that started a couple days ago. It feels like lightheadedness. She is fatigued as well Does the patient have any new or worsening symptoms? ---Yes Will a triage be completed? ---Yes Related visit to physician within the last 2 weeks? ---No Does the PT have any chronic conditions? (i.e. diabetes, asthma, this includes High risk factors for pregnancy, etc.) ---Yes List chronic conditions. ---heart disease, kidney disease, hypertension Is this a behavioral health or substance abuse call? ---No Guidelines Guideline Title Affirmed Question Affirmed Notes Nurse Date/Time (Eastern Time) Dizziness - Lightheadedness [1] MODERATE dizziness (e.g., interferes with normal activities) AND [2] has NOT been evaluated by physician for this (Exception: dizziness caused by heat exposure, sudden Earleen Reaper 12/04/2020 2:01:27 PM PLEASE NOTE: All timestamps contained within this report are  represented as [place] Standard Time. CONFIDENTIALTY NOTICE: This fax transmission is intended only for the addressee. It contains information that is legally privileged, confidential or otherwise protected from use or disclosure. If you are not the intended recipient, you are strictly prohibited from reviewing, disclosing, copying using or disseminating any of this information or taking any action in reliance on or regarding this information. If you have received this fax in error, please notify us immediately by telephone so that we can arrange for its return to [place]. Phone: [phone], Toll-Free: [phone], Fax: [phone] Page: 2 of 2 Call Id: [id number] Guidelines Guideline Title Affirmed Question Affirmed Notes Nurse Date/Time Eilene Ghazi Time) standing, or poor fluid intake) Disp. Time Eilene Ghazi Time) Disposition Final User 12/04/2020 2:04:40 PM See PCP within 24 Hours Yes Ysidro Evert, RN, Marin Shutter Disagree/Comply Comply Caller Understands Yes PreDisposition Did not know what to do Care Advice Given Per Guideline SEE PCP WITHIN 24 HOURS: * IF OFFICE WILL BE OPEN: You need to be examined within the next 24 hours. Call your doctor (or NP/PA) when the office opens and make an appointment. DRINK FLUIDS: * Drink several glasses of fruit juice, other clear fluids or water. * Lie down with feet elevated for 1 hour. LIE DOWN AND REST: * This will improve circulation and increase blood flow to the brain. CALL BACK IF: * You become worse CARE ADVICE given per Dizziness (Adult) guideline

## 2020-12-04 NOTE — Telephone Encounter (Deleted)
[facility] - Client TELEPHONE ADVICE RECORD [facility] Patient Name: [name] [place] Gender: Female DOB: 01/06/1964 Age: 87 Y 10 M 29 D Return Phone Number: [phone] (Primary) Address: City/ State/ Zip: [place] [address] Client [facility] - Client Client Site [place] - Day Physician Tor Netters- NP Contact Type Call Who Is Calling Patient / Member / Family / Caregiver Call Type Triage / Clinical Relationship To Patient Self Return Phone Number [phone] (Primary) Chief Complaint Dizziness Reason for Call Symptomatic / Request for [place] states she is having dizziness and the office wanted her triaged. She states it has been going on over the weekend. Translation No Nurse Assessment Nurse: Doyle Askew, RN, Beth Date/Time (Eastern Time): 12/04/2020 1:24:53 PM Confirm and document reason for call. If symptomatic, describe symptoms. ---Caller states she is having dizziness (states mostly at night and wakes up in middle of night with room spinning) and the office wanted her triaged. Caller states it has been going on over the weekend. Does the patient have any new or worsening symptoms? ---Yes Will a triage be completed? ---Yes Related visit to physician within the last 2 weeks? ---No Does the PT have any chronic conditions? (i.e. diabetes, asthma, this includes High risk factors for pregnancy, etc.) ---No Is the patient pregnant or possibly pregnant? (Ask all females between the ages of 63-55) ---No Is this a behavioral health or substance abuse call? ---No Guidelines Guideline Title Affirmed Question Affirmed Notes Nurse Date/Time (Eastern Time) Dizziness - Vertigo [1] MODERATE dizziness (e.g., vertigo; feels very unsteady, interferes with normal activities) AND [2] has NOT Doyle Askew, RN, [place] 12/04/2020 1:26:11 PM PLEASE NOTE: All timestamps  contained within this report are represented as [place] Standard Time. CONFIDENTIALTY NOTICE: This fax transmission is intended only for the addressee. It contains information that is legally privileged, confidential or otherwise protected from use or disclosure. If you are not the intended recipient, you are strictly prohibited from reviewing, disclosing, copying using or disseminating any of this information or taking any action in reliance on or regarding this information. If you have received this fax in error, please notify us immediately by telephone so that we can arrange for its return to [place]. Phone: [phone], Toll-Free: [phone], Fax: [phone] Page: 2 of 2 Call Id: [id number] Guidelines Guideline Title Affirmed Question Affirmed Notes Nurse Date/Time Eilene Ghazi Time) been evaluated by physician for this Disp. Time Eilene Ghazi Time) Disposition Final User 12/04/2020 1:18:55 PM Attempt made - message left Doyle Askew, RN, Beth 12/04/2020 1:30:19 PM See PCP within 24 Hours Yes Doyle Askew, RN, [employer] Caller Disagree/Comply Comply Caller Understands Yes PreDisposition Did not know what to do Care Advice Given Per Guideline SEE PCP WITHIN 24 HOURS: * IF OFFICE WILL BE OPEN: You need to be examined within the next 24 hours. Call your doctor (or NP/PA) when the office opens and make an appointment. CALL BACK IF: * Severe headache occurs * Weakness develops in an arm or leg * Unable to walk without falling * You become worse CARE ADVICE given per Dizziness - Vertigo (Adult) guideline. Referrals REFERRED TO PCP OFFICE

## 2020-12-05 ENCOUNTER — Encounter: Payer: Self-pay | Admitting: Family Medicine

## 2020-12-05 ENCOUNTER — Other Ambulatory Visit: Payer: Self-pay

## 2020-12-05 ENCOUNTER — Ambulatory Visit (INDEPENDENT_AMBULATORY_CARE_PROVIDER_SITE_OTHER): Payer: PPO | Admitting: Family Medicine

## 2020-12-05 VITALS — Temp 97.8°F | Ht 66.75 in | Wt 150.0 lb

## 2020-12-05 DIAGNOSIS — D508 Other iron deficiency anemias: Secondary | ICD-10-CM

## 2020-12-05 DIAGNOSIS — I10 Essential (primary) hypertension: Secondary | ICD-10-CM | POA: Diagnosis not present

## 2020-12-05 DIAGNOSIS — L03116 Cellulitis of left lower limb: Secondary | ICD-10-CM

## 2020-12-05 DIAGNOSIS — I951 Orthostatic hypotension: Secondary | ICD-10-CM

## 2020-12-05 DIAGNOSIS — R42 Dizziness and giddiness: Secondary | ICD-10-CM | POA: Diagnosis not present

## 2020-12-05 DIAGNOSIS — I959 Hypotension, unspecified: Secondary | ICD-10-CM | POA: Insufficient documentation

## 2020-12-05 LAB — COMPREHENSIVE METABOLIC PANEL
ALT: 7 U/L (ref 0–35)
AST: 14 U/L (ref 0–37)
Albumin: 4.2 g/dL (ref 3.5–5.2)
Alkaline Phosphatase: 50 U/L (ref 39–117)
BUN: 33 mg/dL — ABNORMAL HIGH (ref 6–23)
CO2: 33 mEq/L — ABNORMAL HIGH (ref 19–32)
Calcium: 9.2 mg/dL (ref 8.4–10.5)
Chloride: 98 mEq/L (ref 96–112)
Creatinine, Ser: 1.46 mg/dL — ABNORMAL HIGH (ref 0.40–1.20)
GFR: 32.8 mL/min — ABNORMAL LOW (ref >60.00)
Glucose, Bld: 68 mg/dL — ABNORMAL LOW (ref 70–99)
Potassium: 4.2 mEq/L (ref 3.5–5.1)
Sodium: 139 mEq/L (ref 135–145)
Total Bilirubin: 0.4 mg/dL (ref 0.2–1.2)
Total Protein: 7 g/dL (ref 6.0–8.3)

## 2020-12-05 LAB — CBC WITH DIFFERENTIAL/PLATELET
Basophils Absolute: 0 10*3/uL (ref 0.0–0.1)
Basophils Relative: 0.6 % (ref 0.0–3.0)
Eosinophils Absolute: 0 10*3/uL (ref 0.0–0.7)
Eosinophils Relative: 0.7 % (ref 0.0–5.0)
HCT: 33 % — ABNORMAL LOW (ref 36.0–46.0)
Hemoglobin: 10.7 g/dL — ABNORMAL LOW (ref 12.0–15.0)
Lymphocytes Relative: 18.5 % (ref 12.0–46.0)
Lymphs Abs: 0.7 10*3/uL (ref 0.7–4.0)
MCHC: 32.4 g/dL (ref 30.0–36.0)
MCV: 87.4 fl (ref 78.0–100.0)
Monocytes Absolute: 0.6 10*3/uL (ref 0.1–1.0)
Monocytes Relative: 16 % — ABNORMAL HIGH (ref 3.0–12.0)
Neutro Abs: 2.6 10*3/uL (ref 1.4–7.7)
Neutrophils Relative %: 64.2 % (ref 43.0–77.0)
Platelets: 194 10*3/uL (ref 150.0–400.0)
RBC: 3.78 Mil/uL — ABNORMAL LOW (ref 3.87–5.11)
RDW: 15.9 % — ABNORMAL HIGH (ref 11.5–15.5)
WBC: 4 10*3/uL (ref 4.0–10.5)

## 2020-12-05 MED ORDER — CEPHALEXIN 500 MG PO CAPS: 500.0000 mg | ORAL_CAPSULE | Freq: Two times a day (BID) | ORAL | 0 refills | Status: AC

## 2020-12-05 NOTE — Assessment & Plan Note (Signed)
Pt with hx of poor healing and cancer in similar area but notes new lesion. Will treat with course of abx and if no improvement consider derm f/u vs alternative abx if worsening.

## 2020-12-05 NOTE — Assessment & Plan Note (Signed)
Suspect this is secondary cause of dizziness. Advised slow head movement and home exercises if intervention for orthostatic symptoms not improved.

## 2020-12-05 NOTE — Progress Notes (Signed)
Subjective:     [name] is a 87 y.o. female presenting for Dizziness     Dizziness Pertinent negatives include no chest pain, coughing, nausea or vomiting.   #Orthostatic vital - getting very dizzy  - having to hold on to things when standing - improves with closing eyes and sitting or laying down - eyes have been watery a lot - symptoms have been bad for 1 week - was started on losartan 12.5 mg by the heart failure clinic a few months ago - had to cancel her cardiology appointment yesterday due to transportation issue   Endorses dizziness with turning the head and bending over as well  No room spinning  But endorses symptoms when rolling over in bed  #Skin lesion - left ankle - redness - stable - painful to touch - present x 1 week - hx of skin cancer in similar area  Review of Systems  Respiratory:  Negative for cough and shortness of breath.   Cardiovascular:  Negative for chest pain and palpitations.  Gastrointestinal:  Negative for diarrhea, nausea and vomiting.  Neurological:  Positive for dizziness.    Social History   Tobacco Use  Smoking Status Former   Packs/day: 0.50   Years: 40.00   Pack years: 20.00   Types: Cigarettes   Quit date: 05/06/2001   Years since quitting: 19.5  Smokeless Tobacco Never        Objective:    BP Readings from Last 3 Encounters:  10/05/20 128/70  08/01/20 (!) 158/62  07/24/20 138/60   Wt Readings from Last 3 Encounters:  12/05/20 150 lb (68 kg)  10/05/20 149 lb 12 oz (67.9 kg)  08/01/20 146 lb 12.8 oz (66.6 kg)    Temp 97.8 F (36.6 C) (Temporal)   Ht 5' 6.75" (1.695 m)   Wt 150 lb (68 kg)   SpO2 95%   BMI 23.67 kg/m   Orthostatic VS for the past 24 hrs (Last 3 readings):  BP- Lying Pulse- Lying BP- Sitting Pulse- Sitting BP- Standing at 0 minutes Pulse- Standing at 0 minutes BP- Standing at 3 minutes Pulse- Standing at 3 minutes  12/05/20 1206 114/73 62 122/88 64 127/62 67 120/58 61      Physical Exam Constitutional:      General: She is not in acute distress.    Appearance: She is well-developed. She is not diaphoretic.  HENT:     Head: Normocephalic and atraumatic.     Right Ear: External ear normal. There is impacted cerumen.     Left Ear: External ear normal. There is impacted cerumen.     Nose: Nose normal.     Mouth/Throat:     Mouth: Mucous membranes are moist.     Pharynx: No posterior oropharyngeal erythema.  Eyes:     General: No scleral icterus.    Extraocular Movements: Extraocular movements intact.     Conjunctiva/sclera: Conjunctivae normal.     Pupils: Pupils are equal, round, and reactive to light.  Cardiovascular:     Rate and Rhythm: Normal rate and regular rhythm.     Heart sounds: Murmur heard.  Pulmonary:     Effort: Pulmonary effort is normal. No respiratory distress.     Breath sounds: Normal breath sounds. No wheezing.  Musculoskeletal:        General: Normal range of motion.     Cervical back: Neck supple.  Skin:    General: Skin is warm and dry.  Capillary Refill: Capillary refill takes less than 2 seconds.     Comments: Left foot: left outer foot with 2 scabbed lesions surrounding erythema, warm and ttp  Neurological:     Mental Status: She is alert. Mental status is at baseline.  Psychiatric:        Mood and Affect: Mood normal.        Behavior: Behavior normal.          Assessment & Plan:   Problem List Items Addressed This Visit       Cardiovascular and Mediastinum   Hypertension - Primary   Relevant Orders   Comprehensive metabolic panel   Orthostatic hypotension    VS consistent and hx of postural dizziness. Advised slow transition and leg movements. Will also trial off losartan 12.5 mg to see if there is improvement. Cont hydration.        Relevant Orders   CBC with Differential   Comprehensive metabolic panel     Other   Iron deficiency anemia    No active bleeding, but with dizziness will check  labs. Cont iron supplement       Relevant Orders   CBC with Differential   Cellulitis of left lower extremity    Pt with hx of poor healing and cancer in similar area but notes new lesion. Will treat with course of abx and if no improvement consider derm f/u vs alternative abx if worsening.        Relevant Medications   cephALEXin (KEFLEX) 500 MG capsule   Vertigo    Suspect this is secondary cause of dizziness. Advised slow head movement and home exercises if intervention for orthostatic symptoms not improved.        Relevant Orders   CBC with Differential   Comprehensive metabolic panel     No follow-ups on file.  Lesleigh Noe, MD  This visit occurred during the SARS-CoV-2 public health emergency.  Safety protocols were in place, including screening questions prior to the visit, additional usage of staff PPE, and extensive cleaning of exam room while observing appropriate contact time as indicated for disinfecting solutions.

## 2020-12-05 NOTE — Assessment & Plan Note (Signed)
No active bleeding, but with dizziness will check labs. Cont iron supplement

## 2020-12-05 NOTE — Assessment & Plan Note (Signed)
VS consistent and hx of postural dizziness. Advised slow transition and leg movements. Will also trial off losartan 12.5 mg to see if there is improvement. Cont hydration.

## 2020-12-05 NOTE — Patient Instructions (Signed)
#  Orthostatic Hypotension - Slow transition - from lying to sitting to standing - do criss cross legs before you transition - do not take the losartan for 3-5 days - if improvement in dizziness message me and cardiology to let us know and continue to remain off the medication    If still no improvement or if improvement in standing symptoms but no change in the head movement or bending over symptoms - try the exercise below   Brandt-Daroff Exercise Here's what you need to do for this exercise:  Start in an upright, seated position on your bed. Tilt your head around a 45-degree angle away from the side causing your vertigo. Move into the lying position on one side with your nose pointed up. Stay in this position for about 30 seconds or until the vertigo eases off, whichever is longer. Then move back to the seated position. Repeat on the other side. You should do these movements from three to five times in a session. You should have three sessions a day for up to 2 weeks, or until the vertigo is gone for 2 days.

## 2020-12-06 ENCOUNTER — Encounter: Payer: Self-pay | Admitting: Family Medicine

## 2020-12-12 ENCOUNTER — Telehealth: Payer: Self-pay | Admitting: Oncology

## 2020-12-12 ENCOUNTER — Other Ambulatory Visit: Payer: Self-pay

## 2020-12-12 DIAGNOSIS — D508 Other iron deficiency anemias: Secondary | ICD-10-CM

## 2020-12-12 NOTE — Telephone Encounter (Signed)
yes

## 2020-12-12 NOTE — Telephone Encounter (Signed)
Patient called to schedule appointment. Last seen 03/2020.   Routing to clinical team to make aware (if lab or possible injection encounters needed).

## 2020-12-18 NOTE — Progress Notes (Addendum)
PCP: Lesleigh Noe, MD Cardiology: Dr. Rockey Situ HF Cardiology: Dr. [name]  Reason for Visit: F/u for Chronic Diastolic Heart Failure and Hypertension   87 y.o. with history of permanent atrial fibrillation, chronic diastolic CHF, CKD stage 3, smoking/prior COPD, renal cell CA s/p right nephrectomy.  She has a prior history of HFrEF with nonischemic cardiomyoapthy and had a Medtronic ICD placed.  However, subsequently her EF has recovered.  More recently, she has had symptomatic HFpEF.    She has had dyspnea and edema for "a long time," worse starting 1/20. In 6/20, she had left hip ORIF after mechanical fall.  Echo in 6/20 showed EF 60-65%, mild RV dilation with normal systolic function, PASP 93 mmHg.  After she got home from the hospital stay post-ORIF, she noted marked peripheral edema.  She was short of breath walking short distances.  +orthopnea.  She came back to the hospital on 11/04/18 and was admitted with volume overload.  She was diuresed aggressively and lost about 22 lbs.  RHC showed pulmonary venous hypertension.  PCWP tracing had prominent v-waves in absence of significant MR, suggesting significant diastolic dysfunction.   She has been noted to have significant Fe deficiency anemia, but FOBT was negative during 7/20 hospitalization.   She was admitted in 11/20 with symptomatic anemia, GI workup concerning for ischemic bowel.  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 Doren Custard with VVS and conservative management recommended.  Capsule endoscopy showed no definite source of bleeding.  She was volume overloaded due to significant RV dysfunction and diuresed, she also developed AKI on CKD stage 3.      She returned for follow up of CHF on 10/21.  Breathing is stable, short of breath walking longer distances or up inclines.  No orthopnea/PND.  No chest pain.  She is "dizzy all the time."  Further questioning suggests that this seems to be more of a balance issue than  lightheadedness, orthostatics done today were negative.  Weight is up 6 lbs.  Recently, we had cut back on her metolazone from 3 times a week to currently once a week. Metolazone was continued Monday and Thursday, repeat echo ordered.  Seen last visit 2/22 and was felt to be mildly hypovolemic and complained of dizziness. No dyspnea. Torsemide was reduced down from 100 mg bid to 100 mg qam/ 80 mg qpm. Her BP was also mildly elevated. She was instructed to monitor at home and to bring a log of readings to her next appt.   Earlier this month, she was seen by PCP and started on abx for LE cellulitis.   Today she returns for HF follow up here with her daughter. She tells me she received news recently her biopsy from lower leg lesion came back cancerous. Her biggest concern is her dizziness for the past 2 weeks. She was seen by her PCP and losartan stopped. She was given Brandt-Daroff exercises but has not been completing these. More SOB over the last 2 weeks. Occasional atypical chest pain that resolves spontaneously. She has been having dark-tarry stools and might have seen blood after having a BM.  Denies edema or PND/Orthopnea. Appetite ok. No fever or chills. Weight at home 147-151 pounds. Taking all medications. Has not taken metolazone.  Medtronic device interrogation: OptiVol fluid trending up, thoracic impedence at threshold, VT/VF, >95% Vpacing (personally reviewed).   ECG (personally reviewed): Bi-V pacing.  Cardiomems PADP: 18, goal 17  Labs (7/20): K 3.7, creatinine 1.44 => 1.33  Labs (12/20): pro-BNP 431, K 4.4, creatinine 1.24, hgb 8.2 Labs (10/21): K 4, creatinine 1.83 Labs (12/21): K 3.5, creatinine 1.45 Labs (2/22): K 5.8 (Lokelma ordered), creatinine 1.39 Labs (3/22): K 4.4  Creatinine 1.42 Labs (8/22): K 4.2, creatinine 1.46, hgb 10.7  PMH: 1. COPD 2. Renal cell carcinoma: S/p right kidney resection.  3. CKD stage 3 4. Fe deficiency anemia 5. Atrial fibrillation: Permanent.  She has failed amiodarone, Tikosyn, and flecainide as well as multiple cardioversions. 6. Chronic diastolic CHF: She has history of prior HFrEF with nonischemic cardiomyopathy.  She has a Medtronic ICD.   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  - More recently, EF has been in normal range but she has had diastolic CHF.  - Echo (2/70): EF 60-65%, mildly dilated RV with PASP 93 mmHg, mild-moderate TR.  - RHC (7/20): mean RA 11, PA 64/18 mean 39, mean PCWP 24 with v waves to 41, CI 4.21, PVR 1.9 WU.  - Cardiomems placement - RHC (11/20): mean RA 13, PA 63/23 mean 25 with v waves to 47 (MR only mild by echo), mean PCWP 25, CI 3.18, PVR 2.6 WU - Echo (11/21): EF 65-70%, moderate LVH, RVSP 85.9 mmHg, severe RAE, severe TR, mod-sev AS, 7. Hyperlipidemia 8. Carotid stenosis: Carotid dopplers (6/23) with 76-28% LICA stenosis.   9. Possible ischemic bowel in 11/20 10. 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 Conservative management per Dr. Doren Custard.   Social History   Socioeconomic History   Marital status: Widowed    Spouse name: Not on file   Number of children: 2   Years of education: high school   Highest education level: Not on file  Occupational History   Occupation: Retired    Fish farm manager: RETIRED  Tobacco Use   Smoking status: Former    Packs/day: 0.50    Years: 40.00    Pack years: 20.00    Types: Cigarettes    Quit date: 05/06/2001    Years since quitting: 19.6   Smokeless tobacco: Never  Vaping Use   Vaping Use: Never used  Substance and Sexual Activity   Alcohol use: No    Alcohol/week: 0.0 standard drinks   Drug use: No   Sexual activity: Not Currently  Other Topics Concern   Not on file  Social History Narrative   Would desire CPR   07/11/20   From: the area   Living: with great-grandson - [name] (2003)   Work: retired - clerical      Family: 2 children - [name] and [place] - 2 grand chlidren - 5 great grand children      Enjoys:  spending time with friends - watch move, eat out, spend time      Exercise: walking around the house   Diet: not good, limits red meat, limits salt, low appetite      Safety   Seat belts: Yes    Guns: Yes  and secure   Safe in relationships: Yes    Social Determinants of Health   Financial Resource Strain: Not on file  Food Insecurity: Not on file  Transportation Needs: Not on file  Physical Activity: Not on file  Stress: Not on file  Social Connections: Not on file  Intimate Partner Violence: Not on file   Family History  Problem Relation Age of Onset   Heart failure Mother    Early death Father        car accident   Breast cancer  Maternal Aunt 82   Breast cancer Cousin    Breast cancer Other    Colon cancer Neg Hx    ROS: All systems reviewed and negative except as per HPI.   Current Outpatient Medications  Medication Sig Dispense Refill   acetaminophen (TYLENOL) 500 MG tablet Take 500-1,000 mg by mouth every 8 (eight) hours as needed for mild pain or headache.      apixaban (ELIQUIS) 2.5 MG TABS tablet Take 1 tablet (2.5 mg total) by mouth 2 (two) times daily. Dispense starter pack 60 tablet 11   cetirizine (ZYRTEC) 10 MG tablet Take 10 mg by mouth daily. As needed     Cholecalciferol (VITAMIN D) 50 MCG (2000 UT) CAPS Take 4,000 Units by mouth daily.     Cranberry (AZO CRANBERRY GUMMIES) 500 MG CHEW Chew 2,000 mg by mouth daily.     Cyanocobalamin (B-12 PO) Take 1 tablet by mouth daily.      ezetimibe (ZETIA) 10 MG tablet Take 1 tablet (10 mg total) by mouth daily. 30 tablet 11   ferrous sulfate 325 (65 FE) MG tablet Take 1 tablet (325 mg total) by mouth 2 (two) times daily with a meal. 60 tablet 0   fluticasone (FLONASE) 50 MCG/ACT nasal spray Place 2 sprays into both nostrils daily as needed for allergies or rhinitis. 16 g 6   levothyroxine (SYNTHROID) 25 MCG tablet TAKE 1 TABLET BY MOUTH ONCE DAILY BEFOREBREAKFAST 30 tablet 3   magnesium oxide (MAG-OX) 400 MG tablet  Take 1 tablet (400 mg total) by mouth daily. 30 tablet 2   metolazone (ZAROXOLYN) 2.5 MG tablet Take 2.5 mg by mouth as needed.     ondansetron (ZOFRAN) 4 MG tablet Take 1 tablet (4 mg total) by mouth every 8 (eight) hours as needed for nausea or vomiting. 20 tablet 1   pantoprazole (PROTONIX) 40 MG tablet TAKE 1 TABLET (40 MG TOTAL) BY MOUTH DAILY AT 6 (SIX) AM. 30 tablet 0   potassium chloride SA (KLOR-CON M20) 20 MEQ tablet Take 2 tablets (40 mEq total) by mouth 2 (two) times daily. Take 1 extra tablet on the days you take metolazone. 360 tablet 3   rosuvastatin (CRESTOR) 40 MG tablet Take 1 tablet (40 mg total) by mouth daily. 90 tablet 3   torsemide (DEMADEX) 100 MG tablet Take 1 tablet (100 mg total) by mouth in the morning. To be taken with 80 mg in the PM 30 tablet 3   torsemide (DEMADEX) 20 MG tablet Take 4 tablets (80 mg total) by mouth every evening. To be taken with 100 mg tab in the AM 120 tablet 0   vitamin C (ASCORBIC ACID) 250 MG tablet Take 500 mg by mouth daily.     No current facility-administered medications for this encounter.   Wt Readings from Last 3 Encounters:  12/19/20 67.4 kg (148 lb 9.6 oz)  12/05/20 68 kg (150 lb)  10/05/20 67.9 kg (149 lb 12 oz)   BP (!) 179/62   Pulse 60   Wt 67.4 kg (148 lb 9.6 oz)   SpO2 100%   BMI 23.45 kg/m    PHYSICAL EXAM: General:  NAD. No resp difficulty, thin HEENT: Normal Neck: Supple. No JVD. Carotids 2+ bilat; no bruits. No lymphadenopathy or thryomegaly appreciated. Cor: PMI nondisplaced. Regular rate & rhythm. No rubs, gallops or murmurs. Lungs: Clear Abdomen: Soft, nontender, nondistended. No hepatosplenomegaly. No bruits or masses. Good bowel sounds. Extremities: No cyanosis, clubbing, rash, edema; scabbed lesion to left  lateral leg Neuro: Alert & oriented x 3, cranial nerves grossly intact. Moves all 4 extremities w/o difficulty. Affect pleasant.  Assessment/Plan: 1. Chronic diastolic CHF: Echo in 4/74 showed EF  60-65%, mild RV dilation with normal systolic function, PASP 93 mmHg.  She had remote nonischemic cardiomyopathy with recovery of EF, has Medtronic ICD.  I suspect that there is significant RV dysfunction. Atrial fibrillation likely contributes, but this appears to be permanent now as she has failed multiple anti-arrhythmics and is reasonably rate-controlled. [place] 11/20 showed elevated filling pressures and pulmonary venous hypertension.  Prominent v-waves on PCWP tracing likely due to stiff ventricle/diastolic dysfunction as she had only mild MR on echo. Echo (11/21): EF 65-70%, moderate LVH, RVSP 85.9 mmHg, severe RAE, severe TR, mod-sev AS. NYHA Class II-III, confounded by old age. She has Cardiomems.  Volume status stable by Cardiomems and by Optivol, euvolemic today. - Continue Torsemide 100 mg qam/ 80 mg qpm. - Continue Metolazone PRN. 2. Hypertension  - Uncontrolled. Losartan stopped for presumed orthostasis. - Orthostatics negative today in office. - Restart Losartan 12.5 mg daily. BMET today, repeat in 7 days. - Check BP at home and bring to next appt. If SBP consistently >150, will increase losartan. 3. Pulmonary hypertension: PA systolic pressure estimated 85.9 mmHg on last echo. Based on [place] 11/20, she has primarily pulmonary venous hypertension.  4. CKD: Stage 3.  Last SCr 1.4 - BMET today and again in 7 days w/ addition of losartan. If K borderline high, will reduce KCl supp. 5. Anemia: Fe deficiency.  Capsule endoscopy during 11/20 admission did not show bleeding source. SBE 11/21 with two actively bleeding angioectasias treated with APC. Recent CBC with Hgb down to 10.7 from previous 11.8. PCP has referred her to Heme/Onc. - She endorses black stools (although is on Fe supp) and some frank blood when wiping after BM. She is requesting to be referred back to GI. Will arrange for this. 6. Atrial fibrillation: Permanent now.  Rate control is reasonable. She has failed amiodarone, Tikosyn,  and flecainide as well as multiple cardioversions.  - Continue Eliquis for now. 7. Dizziness: Orthostatics negative today. Device interrogation reviewed. ECG with no significant change.  - Sounds like vertigo. Encouraged her to complete BPPV maneuvers. - Labs today.  F/u in 6-8 weeks with Dr. Wynema Birch [facility] FNP 12/19/2020

## 2020-12-19 ENCOUNTER — Other Ambulatory Visit: Payer: Self-pay

## 2020-12-19 ENCOUNTER — Telehealth: Payer: Self-pay

## 2020-12-19 ENCOUNTER — Encounter (HOSPITAL_COMMUNITY): Payer: Self-pay

## 2020-12-19 ENCOUNTER — Ambulatory Visit (HOSPITAL_COMMUNITY)
Admission: RE | Admit: 2020-12-19 | Discharge: 2020-12-19 | Disposition: A | Discharge status: Home / Self Care | Payer: PPO | Source: Ambulatory Visit | Attending: Family Medicine | Admitting: Family Medicine

## 2020-12-19 VITALS — BP 179/62 | HR 60 | Wt 148.6 lb

## 2020-12-19 DIAGNOSIS — I130 Hypertensive heart and chronic kidney disease with heart failure and stage 1 through stage 4 chronic kidney disease, or unspecified chronic kidney disease: Secondary | ICD-10-CM | POA: Diagnosis not present

## 2020-12-19 DIAGNOSIS — Z905 Acquired absence of kidney: Secondary | ICD-10-CM | POA: Insufficient documentation

## 2020-12-19 DIAGNOSIS — I4819 Other persistent atrial fibrillation: Secondary | ICD-10-CM

## 2020-12-19 DIAGNOSIS — I5033 Acute on chronic diastolic (congestive) heart failure: Secondary | ICD-10-CM | POA: Diagnosis not present

## 2020-12-19 DIAGNOSIS — I272 Pulmonary hypertension, unspecified: Secondary | ICD-10-CM | POA: Diagnosis not present

## 2020-12-19 DIAGNOSIS — E785 Hyperlipidemia, unspecified: Secondary | ICD-10-CM | POA: Diagnosis not present

## 2020-12-19 DIAGNOSIS — I4821 Permanent atrial fibrillation: Secondary | ICD-10-CM | POA: Diagnosis not present

## 2020-12-19 DIAGNOSIS — Z7901 Long term (current) use of anticoagulants: Secondary | ICD-10-CM | POA: Insufficient documentation

## 2020-12-19 DIAGNOSIS — I2510 Atherosclerotic heart disease of native coronary artery without angina pectoris: Secondary | ICD-10-CM | POA: Diagnosis not present

## 2020-12-19 DIAGNOSIS — Z79899 Other long term (current) drug therapy: Secondary | ICD-10-CM | POA: Diagnosis not present

## 2020-12-19 DIAGNOSIS — I10 Essential (primary) hypertension: Secondary | ICD-10-CM

## 2020-12-19 DIAGNOSIS — N183 Chronic kidney disease, stage 3 unspecified: Secondary | ICD-10-CM | POA: Diagnosis not present

## 2020-12-19 DIAGNOSIS — Z8249 Family history of ischemic heart disease and other diseases of the circulatory system: Secondary | ICD-10-CM | POA: Diagnosis not present

## 2020-12-19 DIAGNOSIS — D508 Other iron deficiency anemias: Secondary | ICD-10-CM | POA: Diagnosis not present

## 2020-12-19 DIAGNOSIS — R42 Dizziness and giddiness: Secondary | ICD-10-CM

## 2020-12-19 DIAGNOSIS — J449 Chronic obstructive pulmonary disease, unspecified: Secondary | ICD-10-CM | POA: Diagnosis not present

## 2020-12-19 DIAGNOSIS — Z87891 Personal history of nicotine dependence: Secondary | ICD-10-CM | POA: Diagnosis not present

## 2020-12-19 DIAGNOSIS — I428 Other cardiomyopathies: Secondary | ICD-10-CM | POA: Insufficient documentation

## 2020-12-19 DIAGNOSIS — Z85528 Personal history of other malignant neoplasm of kidney: Secondary | ICD-10-CM | POA: Insufficient documentation

## 2020-12-19 DIAGNOSIS — Z95810 Presence of automatic (implantable) cardiac defibrillator: Secondary | ICD-10-CM | POA: Diagnosis not present

## 2020-12-19 LAB — CBC
HCT: 35.6 % — ABNORMAL LOW (ref 36.0–46.0)
Hemoglobin: 10.7 g/dL — ABNORMAL LOW (ref 12.0–15.0)
MCH: 27.8 pg (ref 26.0–34.0)
MCHC: 30.1 g/dL (ref 30.0–36.0)
MCV: 92.5 fL (ref 80.0–100.0)
Platelets: 192 10*3/uL (ref 150–400)
RBC: 3.85 MIL/uL — ABNORMAL LOW (ref 3.87–5.11)
RDW: 15.6 % — ABNORMAL HIGH (ref 11.5–15.5)
WBC: 3.5 10*3/uL — ABNORMAL LOW (ref 4.0–10.5)
nRBC: 0 % (ref 0.0–0.2)

## 2020-12-19 LAB — IRON AND TIBC
Iron: 151 ug/dL (ref 28–170)
Saturation Ratios: 37 % — ABNORMAL HIGH (ref 10.4–31.8)
TIBC: 410 ug/dL (ref 250–450)
UIBC: 259 ug/dL

## 2020-12-19 LAB — BASIC METABOLIC PANEL
Anion gap: 9 (ref 5–15)
BUN: 24 mg/dL — ABNORMAL HIGH (ref 8–23)
CO2: 30 mmol/L (ref 22–32)
Calcium: 9.3 mg/dL (ref 8.9–10.3)
Chloride: 98 mmol/L (ref 98–111)
Creatinine, Ser: 1.57 mg/dL — ABNORMAL HIGH (ref 0.44–1.00)
GFR, Estimated: 32 mL/min — ABNORMAL LOW (ref >60)
Glucose, Bld: 91 mg/dL (ref 70–99)
Potassium: 4 mmol/L (ref 3.5–5.1)
Sodium: 137 mmol/L (ref 135–145)

## 2020-12-19 LAB — VITAMIN B12: Vitamin B-12: 1035 pg/mL — ABNORMAL HIGH (ref 180–914)

## 2020-12-19 LAB — FERRITIN: Ferritin: 16 ng/mL (ref 11–307)

## 2020-12-19 MED ORDER — LOSARTAN POTASSIUM 25 MG PO TABS: 12.5000 mg | ORAL_TABLET | Freq: Every day | ORAL | 3 refills | Status: DC

## 2020-12-19 NOTE — Patient Instructions (Signed)
Orthostatics were performed today  Lab work was performed today, if any labs come back abnormal the clinic will call you  RESTART Losartan 12.5 mg (1/2 tablet) every night  You have been referred to [place] their office will call you to make a appointment   Your physician recommends that you return for lab work in: Monday August 29th, 2022 at 3:00 pm,  Please check blood pressure if your top number your systolic blood pressure is consistently over 160 please call the office  milAt the [facility], you and your health needs are our priority. As part of our continuing mission to provide you with exceptional heart care, we have created designated Provider Care Teams. These Care Teams include your primary Cardiologist (physician) and Advanced Practice Providers (APPs- Physician Assistants and Nurse Practitioners) who all work together to provide you with the care you need, when you need it.   You may see any of the following providers on your designated Care Team at your next follow up: Dr Glori Bickers Dr Loralie Champagne Dr Patrice Paradise, NP Lyda Jester, [place] Ginnie Smart Audry Riles, PharmD   Please be sure to bring in all your medications bottles to every appointment.    If you have any questions or concerns before your next appointment please send [place] a message through [place] or call our office at [phone].    TO LEAVE A MESSAGE FOR THE NURSE SELECT OPTION 2, PLEASE LEAVE A MESSAGE INCLUDING: YOUR NAME DATE OF BIRTH CALL BACK NUMBER REASON FOR CALL**this is important as we prioritize the call backs  YOU WILL RECEIVE A CALL BACK THE SAME DAY AS LONG AS YOU CALL BEFORE 4:00 PM

## 2020-12-19 NOTE — Telephone Encounter (Signed)
Patient called regarding surgery with you next week. She is on eliquis and stated she bled a lot after the biopsy. She spoke with her cardiologist about d/c medication before procedure but was told we needed to reach out to them.   Can you please advise what would be best for her medication and your surgical procedure next Monday?

## 2020-12-19 NOTE — Telephone Encounter (Signed)
Patient advised of information per Dr. Nicole Kindred.

## 2020-12-21 ENCOUNTER — Telehealth (HOSPITAL_COMMUNITY): Payer: Self-pay | Admitting: *Deleted

## 2020-12-21 NOTE — Telephone Encounter (Signed)
Pt returned call she is aware and agreeable with plan.  

## 2020-12-21 NOTE — Telephone Encounter (Signed)
Pt left VM stating Tuesday Jessica restarted losartan 12.5mg  daily. Pt said she was dizzy all day yesterday her bp yesterday was low 100s/40s.  Pt held losartan this AM and she doesn't feel dizzy her bp was 140/70. Pt asked if she should stay on losartan if so can she take it at night?  Allena Katz, FNP is out of the office today Routed to [place] for advice

## 2020-12-21 NOTE — Telephone Encounter (Signed)
Called pt no answer/no vm. Will try pt again later

## 2020-12-21 NOTE — Telephone Encounter (Signed)
Take the losartan at bedtime.

## 2020-12-25 ENCOUNTER — Encounter: Payer: PPO | Admitting: Dermatology

## 2020-12-25 ENCOUNTER — Telehealth (HOSPITAL_COMMUNITY): Payer: Self-pay | Admitting: Adult Health

## 2020-12-25 ENCOUNTER — Telehealth: Payer: Self-pay

## 2020-12-25 NOTE — Telephone Encounter (Signed)
Pt called she has been having blood pressure problems for the last week and today she is very dizzy, she will cancel her surgery appt for today, so she can go see her medical doctor.

## 2020-12-25 NOTE — Telephone Encounter (Signed)
   Called to remind to send Cardiomems reading.   Having some dizziness. Recently started losartan 12.5 mg at bed time.  Weight at home 147 pounds.   Will follow readings.   Lorri Fukuhara NP-C  10:18 AM

## 2020-12-27 ENCOUNTER — Encounter (HOSPITAL_COMMUNITY): Payer: PPO

## 2021-01-01 ENCOUNTER — Encounter: Payer: Self-pay | Admitting: Dermatology

## 2021-01-01 ENCOUNTER — Other Ambulatory Visit (HOSPITAL_COMMUNITY): Payer: PPO

## 2021-01-01 ENCOUNTER — Other Ambulatory Visit: Payer: Self-pay

## 2021-01-01 ENCOUNTER — Telehealth (HOSPITAL_COMMUNITY): Payer: Self-pay | Admitting: Adult Health

## 2021-01-01 ENCOUNTER — Ambulatory Visit: Payer: PPO | Admitting: Dermatology

## 2021-01-01 DIAGNOSIS — C44729 Squamous cell carcinoma of skin of left lower limb, including hip: Secondary | ICD-10-CM | POA: Diagnosis not present

## 2021-01-01 DIAGNOSIS — L578 Other skin changes due to chronic exposure to nonionizing radiation: Secondary | ICD-10-CM | POA: Diagnosis not present

## 2021-01-01 DIAGNOSIS — C4492 Squamous cell carcinoma of skin, unspecified: Secondary | ICD-10-CM

## 2021-01-01 DIAGNOSIS — L720 Epidermal cyst: Secondary | ICD-10-CM

## 2021-01-01 NOTE — Progress Notes (Signed)
   Follow-Up Visit   Subjective  [name] is a 87 y.o. female who presents for the following: SCC bx proven (L lat foot, pt presents for treatment today).   The following portions of the chart were reviewed this encounter and updated as appropriate:       Review of Systems:  No other skin or systemic complaints except as noted in HPI or Assessment and Plan.  Objective  Well appearing patient in no apparent distress; mood and affect are within normal limits.  A focused examination was performed including left foot. Relevant physical exam findings are noted in the Assessment and Plan.  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  Destruction of lesion Complexity: extensive   Destruction method: electrodesiccation and curettage   Informed consent: discussed and consent obtained   Timeout:  patient name, date of birth, surgical site, and procedure verified Curettage performed in three different directions: Yes   Electrodesiccation performed over the curetted area: Yes   Curettage cycles:  2 Lesion length (cm):  1.7 Lesion width (cm):  1.7 Margin per side (cm):  0.1 Final wound size (cm):  1.9 Hemostasis achieved with:  pressure, aluminum chloride and electrodesiccation Outcome: patient tolerated procedure well with no complications   Post-procedure details: wound care instructions given   Additional details:  Mupirocin ointment and Bandaid applied    Moderately differentiated, Adenoid variant  If recurs, would need excision  Epidermal cyst Lower abdomen  Cyst with symptoms and/or recent change.  Discussed surgical excision to remove, including resulting scar and possible recurrence.  Patient will schedule for surgery. Pre-op information given.    Actinic Damage - chronic, secondary to cumulative UV radiation exposure/sun exposure over time - diffuse scaly erythematous macules with  underlying dyspigmentation - Recommend daily broad spectrum sunscreen SPF 30+ to sun-exposed areas, reapply every 2 hours as needed.  - Recommend staying in the shade or wearing long sleeves, sun glasses (UVA+UVB protection) and wide brim hats (4-inch brim around the entire circumference of the hat). - Call for new or changing lesions.   Return in about 1 month (around 02/01/2021) for f/u EDC site L lat foot, to be scheduled for surgery cyst lower abdomen.  I, Othelia Pulling, RMA, am acting as scribe for Brendolyn Patty, MD .  Documentation: I have reviewed the above documentation for accuracy and completeness, and I agree with the above.  Brendolyn Patty MD

## 2021-01-01 NOTE — Telephone Encounter (Signed)
Attempted to call and remind to send reading for Cardi[facility].   No answer/busy.    Nyra Anspaugh NP-C  2:49 PM

## 2021-01-01 NOTE — Patient Instructions (Addendum)
Wound Care Instructions  Cleanse wound gently with soap and water once a day then pat dry with clean gauze. Apply a thing coat of Petrolatum (petroleum jelly, "Vaseline") over the wound (unless you have an allergy to this). We recommend that you use a new, sterile tube of Vaseline. Do not pick or remove scabs. Do not remove the yellow or white "healing tissue" from the base of the wound.  Cover the wound with fresh, clean, nonstick gauze and secure with paper tape. You may use Band-Aids in place of gauze and tape if the would is small enough, but would recommend trimming much of the tape off as there is often too much. Sometimes Band-Aids can irritate the skin.  You should call the office for your biopsy report after 1 week if you have not already been contacted.  If you experience any problems, such as abnormal amounts of bleeding, swelling, significant bruising, significant pain, or evidence of infection, please call the office immediately.  FOR ADULT SURGERY PATIENTS: If you need something for pain relief you may take 1 extra strength Tylenol (acetaminophen) AND 2 Ibuprofen (200mg  each) together every 4 hours as needed for pain. (do not take these if you are allergic to them or if you have a reason you should not take them.) Typically, you may only need pain medication for 1 to 3 days.    If you have any questions or concerns for your doctor, please call our main line at [phone] and press option 4 to reach your doctor's medical assistant. If no one answers, please leave a voicemail as directed and we will return your call as soon as possible. Messages left after 4 pm will be answered the following business day.   You may also send [place] a message via [place]. We typically respond to MyChart messages within 1-2 business days.  For prescription refills, please ask your pharmacy to contact our office. Our fax number is [phone].  If you have an urgent issue when the clinic is closed that  cannot wait until the next business day, you can page your doctor at the number below.    Please note that while we do our best to be available for urgent issues outside of office hours, we are not available 24/7.   If you have an urgent issue and are unable to reach [place], you may choose to seek medical care at your doctor's office, retail clinic, urgent care center, or emergency room.  If you have a medical emergency, please immediately call 911 or go to the emergency department.  Pager Numbers  - Dr. Nehemiah Massed: [phone]  - Dr. Laurence Ferrari: [phone]  - Dr. Nicole Kindred: [phone]  In the event of inclement weather, please call our main line at [phone] for an update on the status of any delays or closures.  Dermatology Medication Tips: Please keep the boxes that topical medications come in in order to help keep track of the instructions about where and how to use these. Pharmacies typically print the medication instructions only on the boxes and not directly on the medication tubes.   If your medication is too expensive, please contact our office at [phone] option 4 or send [place] a message through [place].   We are unable to tell what your co-pay for medications will be in advance as this is different depending on your insurance coverage. However, we may be able to find a substitute medication at lower cost or fill out paperwork to get insurance to cover a  needed medication.   If a prior authorization is required to get your medication covered by your insurance company, please allow [place] 1-2 business days to complete this process.  Drug prices often vary depending on where the prescription is filled and some pharmacies may offer cheaper prices.  The website www.goodrx.com contains coupons for medications through different pharmacies. The prices here do not account for what the cost may be with help from insurance (it may be cheaper with your insurance), but the website can give you the  price if you did not use any insurance.  - You can print the associated coupon and take it with your prescription to the pharmacy.  - You may also stop by our office during regular business hours and pick up a GoodRx coupon card.  - If you need your prescription sent electronically to a different pharmacy, notify our office through [facility] or by phone at [phone] option 4.   Wound Care Instructions  Cleanse wound gently with soap and water once a day then pat dry with clean gauze. Apply a thing coat of Petrolatum (petroleum jelly, "Vaseline") over the wound (unless you have an allergy to this). We recommend that you use a new, sterile tube of Vaseline. Do not pick or remove scabs. Do not remove the yellow or white "healing tissue" from the base of the wound.  Cover the wound with fresh, clean, nonstick gauze and secure with paper tape. You may use Band-Aids in place of gauze and tape if the would is small enough, but would recommend trimming much of the tape off as there is often too much. Sometimes Band-Aids can irritate the skin.  You should call the office for your biopsy report after 1 week if you have not already been contacted.  If you experience any problems, such as abnormal amounts of bleeding, swelling, significant bruising, significant pain, or evidence of infection, please call the office immediately.  FOR ADULT SURGERY PATIENTS: If you need something for pain relief you may take 1 extra strength Tylenol (acetaminophen) AND 2 Ibuprofen (200mg  each) together every 4 hours as needed for pain. (do not take these if you are allergic to them or if you have a reason you should not take them.) Typically, you may only need pain medication for 1 to 3 days.

## 2021-01-02 ENCOUNTER — Other Ambulatory Visit (HOSPITAL_COMMUNITY): Payer: Self-pay | Admitting: Family Medicine

## 2021-01-02 ENCOUNTER — Other Ambulatory Visit (HOSPITAL_COMMUNITY): Payer: PPO

## 2021-01-04 ENCOUNTER — Other Ambulatory Visit (HOSPITAL_COMMUNITY): Payer: Self-pay | Admitting: Family Medicine

## 2021-01-04 ENCOUNTER — Other Ambulatory Visit: Payer: Self-pay

## 2021-01-04 MED ORDER — DOXYCYCLINE HYCLATE 100 MG PO TABS: 100.0000 mg | ORAL_TABLET | Freq: Two times a day (BID) | ORAL | 0 refills | Status: AC

## 2021-01-04 NOTE — Progress Notes (Signed)
Patient called regarding the [facility] done on Monday. She is having a lot of pain and swelling. Per Dr. Nicole Kindred take Doxycycline 100mg  BID. She has been advised by heart doctor to not take ibuprofen. She was also advised to continue Mupirocin Ointment and keep leg elevated. Patient is to call me Tuesday if she feels like she needs to come in for a wound check.

## 2021-01-05 ENCOUNTER — Other Ambulatory Visit (HOSPITAL_COMMUNITY): Payer: Self-pay | Admitting: Family Medicine

## 2021-01-05 MED ORDER — TORSEMIDE 20 MG PO TABS: 80.0000 mg | ORAL_TABLET | Freq: Every evening | ORAL | 11 refills | Status: DC

## 2021-01-09 ENCOUNTER — Other Ambulatory Visit (HOSPITAL_COMMUNITY): Payer: PPO

## 2021-01-09 ENCOUNTER — Other Ambulatory Visit: Payer: Self-pay

## 2021-01-09 ENCOUNTER — Ambulatory Visit: Payer: PPO | Admitting: Dermatology

## 2021-01-09 DIAGNOSIS — L570 Actinic keratosis: Secondary | ICD-10-CM | POA: Diagnosis not present

## 2021-01-09 DIAGNOSIS — I872 Venous insufficiency (chronic) (peripheral): Secondary | ICD-10-CM

## 2021-01-09 DIAGNOSIS — Z85828 Personal history of other malignant neoplasm of skin: Secondary | ICD-10-CM

## 2021-01-09 DIAGNOSIS — M792 Neuralgia and neuritis, unspecified: Secondary | ICD-10-CM

## 2021-01-09 MED ORDER — MUPIROCIN 2 % EX OINT
1.0000 | TOPICAL_OINTMENT | Freq: Every day | CUTANEOUS | 1 refills | Status: DC
Start: 2021-01-09 — End: 2021-03-08

## 2021-01-09 NOTE — Patient Instructions (Addendum)
If you have any questions or concerns for your doctor, please call our main line at [phone] and press option 4 to reach your doctor's medical assistant. If no one answers, please leave a voicemail as directed and we will return your call as soon as possible. Messages left after 4 pm will be answered the following business day.   You may also send [place] a message via [place]. We typically respond to MyChart messages within 1-2 business days.  For prescription refills, please ask your pharmacy to contact our office. Our fax number is [phone].  If you have an urgent issue when the clinic is closed that cannot wait until the next business day, you can page your doctor at the number below.    Please note that while we do our best to be available for urgent issues outside of office hours, we are not available 24/7.   If you have an urgent issue and are unable to reach [place], you may choose to seek medical care at your doctor's office, retail clinic, urgent care center, or emergency room.  If you have a medical emergency, please immediately call 911 or go to the emergency department.  Pager Numbers  - Dr. Nehemiah Massed: [phone]  - Dr. Laurence Ferrari: [phone]  - Dr. Nicole Kindred: [phone]  In the event of inclement weather, please call our main line at [phone] for an update on the status of any delays or closures.  Dermatology Medication Tips: Please keep the boxes that topical medications come in in order to help keep track of the instructions about where and how to use these. Pharmacies typically print the medication instructions only on the boxes and not directly on the medication tubes.   If your medication is too expensive, please contact our office at [phone] option 4 or send [place] a message through [place].   We are unable to tell what your co-pay for medications will be in advance as this is different depending on your insurance coverage. However, we may be able to find a substitute  medication at lower cost or fill out paperwork to get insurance to cover a needed medication.   If a prior authorization is required to get your medication covered by your insurance company, please allow [place] 1-2 business days to complete this process.  Drug prices often vary depending on where the prescription is filled and some pharmacies may offer cheaper prices.  The website www.goodrx.com contains coupons for medications through different pharmacies. The prices here do not account for what the cost may be with help from insurance (it may be cheaper with your insurance), but the website can give you the price if you did not use any insurance.  - You can print the associated coupon and take it with your prescription to the pharmacy.  - You may also stop by our office during regular business hours and pick up a GoodRx coupon card.  - If you need your prescription sent electronically to a different pharmacy, notify our office through [facility] or by phone at [phone] option 4.   Voltaren gel up to 4 times a day

## 2021-01-09 NOTE — Progress Notes (Signed)
   Follow-Up Visit   Subjective  [name] is a 87 y.o. female who presents for the following: check edc site (L lat foot, very painful, Doxycycline 100mg  1 po bid ~5 days, vaseline, pt had pain before EDC on 01/01/21). Says she gets shooting pains up leg for ~6 months and has gotten worse.   The following portions of the chart were reviewed this encounter and updated as appropriate:       Review of Systems:  No other skin or systemic complaints except as noted in HPI or Assessment and Plan.  Objective  Well appearing patient in no apparent distress; mood and affect are within normal limits.  A focused examination was performed including left foot. Relevant physical exam findings are noted in the Assessment and Plan.  L lat foot Healing edc site, no evidence of infection, area around wound extremely tender to touch.  pink keratotic macule inferior to EDC site, which was previously frozen last visit, still present     bil lower legs Stasis changes bil lower legs  L foot, L ankle Tender to touch with underlying varicosities, pt reports shooting pains for 6 months  L lat foot/ankle Hyperkeratotic scaly macules/papules   Assessment & Plan  History of SCC (squamous cell carcinoma) of skin L lat foot  Healing from [facility] on 01/01/21, no evidence of infection, no evidence of recurrence (but too early to evaluate for that), with pain out of context with exam  Cont Doxycycline 100mg  1 po bid until finish 10 day course Start mupirocin ointment bid to healing wound and keep covered  Recommend biopsy of lesion inferior to EDC site to r/o SCC, but pt declines today, saying foot is too painful.  Will reconsider on f/up next week.   mupirocin ointment (BACTROBAN) 2 % - L lat foot Apply 1 application topically daily. Qd to wound on foot  Stasis dermatitis of both legs bil lower legs  Stasis in the legs causes chronic leg swelling, which may result in itchy or painful rashes,  skin discoloration, skin texture changes, and sometimes ulceration.  Recommend daily compression hose/stockings- easiest to put on first thing in morning, remove at bedtime.  Elevate legs as much as possible. Avoid salt/sodium rich foods.  Recommend compression hose/socks qd, but she is unable to use due to pain  Neuralgia L foot, L ankle  Unclear etiology.  Pain started 6 months prior to problem with SCC on foot, but has worsened with it.  Level of pain seems out of context with skin findings.  Will refer pt to the Pain Clinic for further evaluation and treatment.  Recommend Voltaran gel up to 4x/day prn pain Continue Tylenol Q 6 hours Pt has severe reactions to opioids  Ambulatory referral to Pain Clinic - L foot, L ankle  Hypertrophic actinic keratosis L lat foot/ankle  Pt declines treatment today  Return in about 6 days (around 01/15/2021) for f/u on pain in foot.  I, Othelia Pulling, RMA, am acting as scribe for Brendolyn Patty, MD .  Documentation: I have reviewed the above documentation for accuracy and completeness, and I agree with the above.  Brendolyn Patty MD

## 2021-01-15 ENCOUNTER — Inpatient Hospital Stay: Payer: PPO

## 2021-01-15 ENCOUNTER — Telehealth: Payer: Self-pay

## 2021-01-15 ENCOUNTER — Ambulatory Visit: Payer: PPO | Admitting: Dermatology

## 2021-01-15 NOTE — Telephone Encounter (Signed)
Patient's daughter called and had to cancel 1 week follow up today due to patient's hemoglobin being too low. Daughter states she has been having this problem for several days now. She is schedule with oncology tomorrow.

## 2021-01-15 NOTE — Telephone Encounter (Signed)
Tried calling patient to see how her foot was doing since she had to cancel today's appointment.  Did not get an answer or vm./sh

## 2021-01-16 ENCOUNTER — Telehealth: Payer: Self-pay | Admitting: *Deleted

## 2021-01-16 ENCOUNTER — Other Ambulatory Visit: Payer: Self-pay

## 2021-01-16 ENCOUNTER — Inpatient Hospital Stay: Payer: PPO | Admitting: Oncology

## 2021-01-16 ENCOUNTER — Ambulatory Visit (INDEPENDENT_AMBULATORY_CARE_PROVIDER_SITE_OTHER): Payer: PPO | Admitting: Family Medicine

## 2021-01-16 ENCOUNTER — Encounter (HOSPITAL_COMMUNITY): Payer: PPO

## 2021-01-16 VITALS — BP 150/60 | HR 50 | Temp 97.0°F | Ht 66.75 in | Wt 149.2 lb

## 2021-01-16 DIAGNOSIS — Z8719 Personal history of other diseases of the digestive system: Secondary | ICD-10-CM | POA: Diagnosis not present

## 2021-01-16 DIAGNOSIS — D508 Other iron deficiency anemias: Secondary | ICD-10-CM

## 2021-01-16 LAB — CBC
HCT: 32 % — ABNORMAL LOW (ref 36.0–46.0)
Hemoglobin: 10.5 g/dL — ABNORMAL LOW (ref 12.0–15.0)
MCHC: 32.7 g/dL (ref 30.0–36.0)
MCV: 87.9 fl (ref 78.0–100.0)
Platelets: 190 10*3/uL (ref 150.0–400.0)
RBC: 3.64 Mil/uL — ABNORMAL LOW (ref 3.87–5.11)
RDW: 16.5 % — ABNORMAL HIGH (ref 11.5–15.5)
WBC: 3.3 10*3/uL — ABNORMAL LOW (ref 4.0–10.5)

## 2021-01-16 LAB — FERRITIN: Ferritin: 26.1 ng/mL (ref 10.0–291.0)

## 2021-01-16 NOTE — Assessment & Plan Note (Signed)
Suspect anemia is likely the cause of her persistent fatigue and dizziness. BP elevated today. CBC stable - but not improving with iron supplementation though ferritin with slight increase. Will get FOBT. Discussed different roles of GI and hematology and importance of both. Given worsening anemia and lack of response feel hematology would be helpful - she would like a second opinion so new referral placed. Cont iron supplementation.

## 2021-01-16 NOTE — Assessment & Plan Note (Addendum)
In November 2021. Would like to see the GI specialist. Will repeat FOBT to help guide urgency.

## 2021-01-16 NOTE — Progress Notes (Signed)
Subjective:     [name] is a 87 y.o. female presenting for Fatigue (3-4 weeks ) and Dizziness     HPI  #fatigue - worse over the last month - black stool - very dark and worse than last month - no blood  - is taking iron - has had some tarry stools - dizziness is worse with standing and moving - Sunday it was severe  - trying not to lay around  - taking iron supplement  #skin lesion - told this was cancer - seeing dermatology - missed appt yesterday   Review of Systems   Social History   Tobacco Use  Smoking Status Former   Packs/day: 0.50   Years: 40.00   Pack years: 20.00   Types: Cigarettes   Quit date: 05/06/2001   Years since quitting: 19.7  Smokeless Tobacco Never        Objective:    BP Readings from Last 3 Encounters:  01/16/21 (!) 150/60  12/19/20 (!) 179/62  10/05/20 128/70   Wt Readings from Last 3 Encounters:  01/16/21 149 lb 4 oz (67.7 kg)  12/19/20 148 lb 9.6 oz (67.4 kg)  12/05/20 150 lb (68 kg)    BP (!) 150/60   Pulse (!) 50   Temp (!) 97 F (36.1 C) (Temporal)   Ht 5' 6.75" (1.695 m)   Wt 149 lb 4 oz (67.7 kg)   SpO2 97%   BMI 23.55 kg/m    Physical Exam Constitutional:      General: She is not in acute distress.    Appearance: She is well-developed. She is not diaphoretic.  HENT:     Right Ear: External ear normal.     Left Ear: External ear normal.  Eyes:     Conjunctiva/sclera: Conjunctivae normal.  Cardiovascular:     Rate and Rhythm: Normal rate and regular rhythm.     Heart sounds: Murmur heard.  Pulmonary:     Effort: Pulmonary effort is normal. No respiratory distress.     Breath sounds: Normal breath sounds. No wheezing.  Musculoskeletal:     Cervical back: Neck supple.  Skin:    General: Skin is warm and dry.     Capillary Refill: Capillary refill takes less than 2 seconds.  Neurological:     Mental Status: She is alert. Mental status is at baseline.  Psychiatric:        Mood and Affect:  Mood normal.        Behavior: Behavior normal.    CBC    Component Value Date/Time   WBC 3.3 (L) 01/16/2021 1108   RBC 3.64 (L) 01/16/2021 1108   HGB 10.5 (L) 01/16/2021 1108   HGB 8.1 (L) 03/22/2019 1415   HCT 32.0 (L) 01/16/2021 1108   HCT 25.9 (L) 03/22/2019 1415   PLT 190.0 01/16/2021 1108   PLT 226 03/22/2019 1415   MCV 87.9 01/16/2021 1108   MCV 88 03/22/2019 1415   MCH 27.8 12/19/2020 1012   MCHC 32.7 01/16/2021 1108   RDW 16.5 (H) 01/16/2021 1108   RDW 19.9 (H) 03/22/2019 1415   LYMPHSABS 0.7 12/05/2020 1251   LYMPHSABS 0.6 (L) 03/22/2019 1415   MONOABS 0.6 12/05/2020 1251   EOSABS 0.0 12/05/2020 1251   EOSABS 0.2 03/22/2019 1415   BASOSABS 0.0 12/05/2020 1251   BASOSABS 0.0 03/22/2019 1415   Lab Results  Component Value Date   FERRITIN 26.1 01/16/2021         Assessment &  Plan:   Problem List Items Addressed This Visit       Other   History of GI bleed    In November 2021. Would like to see the GI specialist. Will repeat FOBT to help guide urgency.        Relevant Orders   Ambulatory referral to Hematology / Oncology   Fecal occult blood, imunochemical   Ambulatory referral to Gastroenterology   Iron deficiency anemia - Primary    Suspect anemia is likely the cause of her persistent fatigue and dizziness. BP elevated today. CBC stable - but not improving with iron supplementation though ferritin with slight increase. Will get FOBT. Discussed different roles of GI and hematology and importance of both. Given worsening anemia and lack of response feel hematology would be helpful - she would like a second opinion so new referral placed. Cont iron supplementation.       Relevant Orders   Ambulatory referral to Hematology / Oncology   Fecal occult blood, imunochemical   Ambulatory referral to Gastroenterology   CBC (Completed)   Ferritin (Completed)    I spent >15 minutes with pt , obtaining history, examining, reviewing chart, documenting encounter  and discussing the above plan of care.   Return if symptoms worsen or fail to improve.  Lesleigh Noe, MD  This visit occurred during the SARS-CoV-2 public health emergency.  Safety protocols were in place, including screening questions prior to the visit, additional usage of staff PPE, and extensive cleaning of exam room while observing appropriate contact time as indicated for disinfecting solutions.

## 2021-01-16 NOTE — Telephone Encounter (Signed)
Opened in error

## 2021-01-16 NOTE — Patient Instructions (Addendum)
#  Referral I have placed a referral to a specialist for you. You should receive a phone call from the specialty office. Make sure your voicemail is not full and that if you are able to answer your phone to unknown or new numbers.   It may take up to 2 weeks to hear about the referral. If you do not hear anything in 2 weeks, please call our office and ask to speak with the referral coordinator.   Hematologist - blood specialist  Gastroenterologist - stomach specialist and they do procedures

## 2021-01-17 ENCOUNTER — Telehealth (HOSPITAL_COMMUNITY): Payer: Self-pay | Admitting: Adult Health

## 2021-01-17 NOTE — Telephone Encounter (Signed)
Attempted to call and remind to send Cardiomems reading.   Millan Legan NP-C  3:14 PM

## 2021-01-18 ENCOUNTER — Telehealth: Payer: Self-pay

## 2021-01-18 NOTE — Telephone Encounter (Signed)
Spoke to patient to see how her pain in her foot was doing.  Patient state the pain is still unbearable.  Asked patient about her scheduled appointment with Dr. Dossie Arbour at pain management that was scheduled 01/15/21 at 10:25 and patient said she did not know anything about an appointment.  I gave patient phone number to pain management and advised to call and let them know she was unaware of an appointment and reschedule.  Advised patient if she had any problem to let me know and I could put in another referral.  Patient advised she had an appointment with her orthopedic doctor on Monday.  I tried to reschedule pt's appointment with Dr. Nicole Kindred and she said she would call back and reschedule once she got the pain in her foot resolved.  Patient advised she finished her 10 day course of Doxycycline and is continuing Mupirocin oint daily.  I advised if she could wear her compression socks it will help with swelling which may help with some pain./sh

## 2021-01-19 ENCOUNTER — Telehealth: Payer: Self-pay | Admitting: Hematology and Oncology

## 2021-01-19 ENCOUNTER — Other Ambulatory Visit (INDEPENDENT_AMBULATORY_CARE_PROVIDER_SITE_OTHER): Payer: PPO

## 2021-01-19 ENCOUNTER — Telehealth: Payer: Self-pay | Admitting: Radiology

## 2021-01-19 DIAGNOSIS — Z8719 Personal history of other diseases of the digestive system: Secondary | ICD-10-CM

## 2021-01-19 DIAGNOSIS — D508 Other iron deficiency anemias: Secondary | ICD-10-CM

## 2021-01-19 LAB — FECAL OCCULT BLOOD, IMMUNOCHEMICAL: Fecal Occult Bld: POSITIVE — AB

## 2021-01-19 NOTE — Telephone Encounter (Signed)
[facility] called a positive IFOB, results given to Dr Danise Mina and sent to Dr Einar Pheasant

## 2021-01-19 NOTE — Telephone Encounter (Signed)
Referral to GI already placed. Routing to referral coordinator to see if it can be switched to stat. Pt with hx of GI bleed and stable anemia on last check

## 2021-01-19 NOTE — Telephone Encounter (Signed)
Scheduled appt per 9/13 referral. PT is aware of appt date and time.

## 2021-01-22 ENCOUNTER — Other Ambulatory Visit (HOSPITAL_COMMUNITY): Payer: Self-pay | Admitting: Orthopedic Surgery

## 2021-01-22 ENCOUNTER — Encounter: Payer: Self-pay | Admitting: Family Medicine

## 2021-01-22 DIAGNOSIS — M25572 Pain in left ankle and joints of left foot: Secondary | ICD-10-CM | POA: Diagnosis not present

## 2021-01-22 DIAGNOSIS — M79672 Pain in left foot: Secondary | ICD-10-CM | POA: Diagnosis not present

## 2021-01-22 DIAGNOSIS — M79662 Pain in left lower leg: Secondary | ICD-10-CM

## 2021-01-23 ENCOUNTER — Ambulatory Visit (HOSPITAL_COMMUNITY)
Admission: RE | Admit: 2021-01-23 | Discharge: 2021-01-23 | Disposition: A | Discharge status: Home / Self Care | Payer: PPO | Source: Ambulatory Visit | Attending: Orthopedic Surgery | Admitting: Orthopedic Surgery

## 2021-01-23 ENCOUNTER — Other Ambulatory Visit: Payer: Self-pay

## 2021-01-23 DIAGNOSIS — M79662 Pain in left lower leg: Secondary | ICD-10-CM | POA: Insufficient documentation

## 2021-01-23 DIAGNOSIS — M7989 Other specified soft tissue disorders: Secondary | ICD-10-CM | POA: Diagnosis not present

## 2021-01-23 NOTE — Progress Notes (Signed)
LLE venous duplex has been completed.  Attempted to call preliminary results to number provided, however there was no answer.   Results can be found under chart review under CV PROC. 01/23/2021 9:39 AM Veatrice Eckstein RVT, RDMS

## 2021-01-26 ENCOUNTER — Telehealth: Payer: Self-pay | Admitting: Hematology and Oncology

## 2021-01-26 NOTE — Telephone Encounter (Signed)
Pt called and left a msg on 9/22 requesting for me to cancel her new hem appt with Dr. Lorenso Courier. I attempted to call pt, but there was no answer and the phone just kept ringing, there was no vm available. I've cancelled the appts per pt request.

## 2021-01-29 ENCOUNTER — Telehealth: Payer: Self-pay

## 2021-01-29 ENCOUNTER — Inpatient Hospital Stay: Payer: PPO | Admitting: Hematology and Oncology

## 2021-01-29 ENCOUNTER — Encounter: Payer: Self-pay | Admitting: Dermatology

## 2021-01-29 ENCOUNTER — Inpatient Hospital Stay: Payer: PPO

## 2021-01-29 NOTE — Telephone Encounter (Signed)
Spoke to pt about pain in her left lower leg/foot.  Pt saw an orthopedic doctor and he did xrays and bones are ok but she does have a bad case of gout.  She is seeing her PCP tomorrow who will address the gout.  She said the [facility] that was treated on her L lat foot,  the scab keeps reforming and falling off.  She is going to send a photo in [place].  I advised if she can use the mupirocin ointment and compression hose it will help with the swelling and possibly the healing.  Advised pt once her leg pain is under control to call us and reschedule appt to recheck the L foot./sh

## 2021-01-30 ENCOUNTER — Ambulatory Visit (INDEPENDENT_AMBULATORY_CARE_PROVIDER_SITE_OTHER): Payer: PPO | Admitting: Family Medicine

## 2021-01-30 ENCOUNTER — Other Ambulatory Visit: Payer: Self-pay

## 2021-01-30 ENCOUNTER — Telehealth: Payer: Self-pay

## 2021-01-30 VITALS — BP 148/62 | HR 62 | Temp 97.0°F | Wt 152.4 lb

## 2021-01-30 DIAGNOSIS — M10372 Gout due to renal impairment, left ankle and foot: Secondary | ICD-10-CM | POA: Insufficient documentation

## 2021-01-30 DIAGNOSIS — L989 Disorder of the skin and subcutaneous tissue, unspecified: Secondary | ICD-10-CM | POA: Diagnosis not present

## 2021-01-30 DIAGNOSIS — N1832 Chronic kidney disease, stage 3b: Secondary | ICD-10-CM

## 2021-01-30 DIAGNOSIS — E790 Hyperuricemia without signs of inflammatory arthritis and tophaceous disease: Secondary | ICD-10-CM | POA: Diagnosis not present

## 2021-01-30 DIAGNOSIS — M1A30X0 Chronic gout due to renal impairment, unspecified site, without tophus (tophi): Secondary | ICD-10-CM | POA: Insufficient documentation

## 2021-01-30 LAB — BASIC METABOLIC PANEL
BUN: 36 mg/dL — ABNORMAL HIGH (ref 6–23)
CO2: 28 mEq/L (ref 19–32)
Calcium: 9.5 mg/dL (ref 8.4–10.5)
Chloride: 101 mEq/L (ref 96–112)
Creatinine, Ser: 1.44 mg/dL — ABNORMAL HIGH (ref 0.40–1.20)
GFR: 33.31 mL/min — ABNORMAL LOW (ref >60.00)
Glucose, Bld: 92 mg/dL (ref 70–99)
Potassium: 4.4 mEq/L (ref 3.5–5.1)
Sodium: 139 mEq/L (ref 135–145)

## 2021-01-30 MED ORDER — COLCHICINE 0.6 MG PO TABS: ORAL_TABLET | ORAL | 0 refills | Status: DC

## 2021-01-30 NOTE — Assessment & Plan Note (Signed)
SCC s/p resection. Pt already messaged Dermatology - appreciate support. Concern for recurrence. Will f/u dermatology visit. Did have poor healing initially so will want to also consider wound vs vascular for additional assessment.

## 2021-01-30 NOTE — Assessment & Plan Note (Signed)
If symptoms improve with gout treatment anticipate starting allopurinol. If not improvement will want to verify gout diagnosis prior to initiating treatment

## 2021-01-30 NOTE — Assessment & Plan Note (Signed)
GFR 32 on last check. Likely increasing risk of gout. Reviewed colchine dosing and if >30 no need for adjustment but if <30 should do once every 1-2 weeks 1 pill. Will recheck BMP and consider dose increase if kidney function stable.

## 2021-01-30 NOTE — Progress Notes (Addendum)
Subjective:     [name] is a 87 y.o. female presenting for Recurrent Skin Infections (L foot. Extremely painful ) and Gout (L foot )     HPI  #Foot pain - left pain - saw and orthopedic doctor - uric acid was 9.5 - told she has gout - was prescribed colchine but only 2 pills and told to take 1 and another 1 week later due to kidney function - went dermatology and had her biopsy  - took 0.6 mg of colchicine w/o improvement - pain has been constant and extreme sensitivity to touch   Review of Systems   Social History   Tobacco Use  Smoking Status Former   Packs/day: 0.50   Years: 40.00   Pack years: 20.00   Types: Cigarettes   Quit date: 05/06/2001   Years since quitting: 19.7  Smokeless Tobacco Never        Objective:    BP Readings from Last 3 Encounters:  01/30/21 (!) 148/62  01/16/21 (!) 150/60  12/19/20 (!) 179/62   Wt Readings from Last 3 Encounters:  01/30/21 152 lb 6 oz (69.1 kg)  01/16/21 149 lb 4 oz (67.7 kg)  12/19/20 148 lb 9.6 oz (67.4 kg)    BP (!) 148/62   Pulse 62   Temp (!) 97 F (36.1 C) (Temporal)   Wt 152 lb 6 oz (69.1 kg)   SpO2 97%   BMI 24.04 kg/m    Physical Exam Constitutional:      General: She is not in acute distress.    Appearance: She is well-developed. She is not diaphoretic.  HENT:     Right Ear: External ear normal.     Left Ear: External ear normal.     Nose: Nose normal.  Eyes:     Conjunctiva/sclera: Conjunctivae normal.  Cardiovascular:     Rate and Rhythm: Normal rate.  Pulmonary:     Effort: Pulmonary effort is normal.  Musculoskeletal:     Cervical back: Neck supple.     Comments: Left foot Inspection: mild erythema, large ulcer with growth and poor healing, swelling Palpation: generalized ttp, no warmth ROM: limited with some discomfort  Skin:    General: Skin is warm and dry.     Capillary Refill: Capillary refill takes less than 2 seconds.  Neurological:     Mental Status: She is  alert. Mental status is at baseline.  Psychiatric:        Mood and Affect: Mood normal.        Behavior: Behavior normal.          Assessment & Plan:   Problem List Items Addressed This Visit       Musculoskeletal and Integument   Skin lesion of foot    SCC s/p resection. Pt already messaged Dermatology - appreciate support. Concern for recurrence. Will f/u dermatology visit. Did have poor healing initially so will want to also consider wound vs vascular for additional assessment.       Acute gout due to renal impairment involving left foot    Not completely convinced this is gout given duration >5 weeks and location - roughly entire foot. However, outside uric acid elevated. Will try gout treatment and advise ortho f/u. Reviewed note with murphy weiner and recent normal Doppler US w/o clot. Does have hx of abnormal ABI which could be contributing. If no improvement next week consider vascular vs ortho f/u      Relevant Medications  colchicine 0.6 MG tablet     Genitourinary   Chronic kidney disease, stage 3b (HCC) - Primary    GFR 32 on last check. Likely increasing risk of gout. Reviewed colchine dosing and if >30 no need for adjustment but if <30 should do once every 1-2 weeks 1 pill. Will recheck BMP and consider dose increase if kidney function stable.       Relevant Orders   Basic metabolic panel     Other   Elevated uric acid in blood    If symptoms improve with gout treatment anticipate starting allopurinol. If not improvement will want to verify gout diagnosis prior to initiating treatment      I spent >25 minutes with pt , obtaining history, examining, reviewing chart, documenting encounter and discussing the above plan of care.  Addendum: GFR >30 so will do higher dose of Colchine - sent to pharmacy  Return if symptoms worsen or fail to improve.  Lesleigh Noe, MD  This visit occurred during the SARS-CoV-2 public health emergency.  Safety protocols were  in place, including screening questions prior to the visit, additional usage of staff PPE, and extensive cleaning of exam room while observing appropriate contact time as indicated for disinfecting solutions.

## 2021-01-30 NOTE — Addendum Note (Signed)
Addended by: Lesleigh Noe on: 01/30/2021 05:03 PM   Modules accepted: Orders

## 2021-01-30 NOTE — Patient Instructions (Addendum)
#  Foot pain - may not be gout - will check kidney function today to see if we can increase the dose of your medication for treatment - update next week - if not improving - would recommend seeing Ortho again  - if improving - we can start Allopurinol for Gout prevention   Call or Mychart next week

## 2021-01-30 NOTE — Assessment & Plan Note (Signed)
Not completely convinced this is gout given duration >5 weeks and location - roughly entire foot. However, outside uric acid elevated. Will try gout treatment and advise ortho f/u. Reviewed note with murphy weiner and recent normal Doppler US w/o clot. Does have hx of abnormal ABI which could be contributing. If no improvement next week consider vascular vs ortho f/u

## 2021-01-30 NOTE — Telephone Encounter (Signed)
Called pt to see if she could come to the office tomorrow at 12:30 for Dr. Nicole Kindred to check her foot.  Pt said she could come to appt.  Pt did see her PCP today and per pt her PCP did some blood work to check and see if her 1 kidney is stable and if it, PCP will proceed with treatment for gout.  If kidney is not stable pt said her PCP will send her back to orthopedic doctor.  I advised pt I would let Dr.Stewart know and they could discuss at tomorrow's appt./sh

## 2021-01-31 ENCOUNTER — Encounter: Payer: Self-pay | Admitting: Dermatology

## 2021-01-31 ENCOUNTER — Ambulatory Visit: Payer: PPO | Admitting: Dermatology

## 2021-01-31 DIAGNOSIS — C44729 Squamous cell carcinoma of skin of left lower limb, including hip: Secondary | ICD-10-CM | POA: Diagnosis not present

## 2021-01-31 DIAGNOSIS — C4492 Squamous cell carcinoma of skin, unspecified: Secondary | ICD-10-CM

## 2021-01-31 DIAGNOSIS — R52 Pain, unspecified: Secondary | ICD-10-CM

## 2021-01-31 DIAGNOSIS — L570 Actinic keratosis: Secondary | ICD-10-CM | POA: Diagnosis not present

## 2021-01-31 NOTE — Patient Instructions (Addendum)
  Cryotherapy Aftercare  . Wash gently with soap and water everyday.   . Apply Vaseline and Band-Aid daily until healed.    If you have any questions or concerns for your doctor, please call our main line at [phone] and press option 4 to reach your doctor's medical assistant. If no one answers, please leave a voicemail as directed and we will return your call as soon as possible. Messages left after 4 pm will be answered the following business day.   You may also send us a message via MyChart. We typically respond to MyChart messages within 1-2 business days.  For prescription refills, please ask your pharmacy to contact our office. Our fax number is [phone].  If you have an urgent issue when the clinic is closed that cannot wait until the next business day, you can page your doctor at the number below.    Please note that while we do our best to be available for urgent issues outside of office hours, we are not available 24/7.   If you have an urgent issue and are unable to reach us, you may choose to seek medical care at your doctor's office, retail clinic, urgent care center, or emergency room.  If you have a medical emergency, please immediately call 911 or go to the emergency department.  Pager Numbers  - Dr. Kowalski: [phone]  - Dr. Moye: [phone]  - Dr. Stewart: [phone]  In the event of inclement weather, please call our main line at [phone] for an update on the status of any delays or closures.  Dermatology Medication Tips: Please keep the boxes that topical medications come in in order to help keep track of the instructions about where and how to use these. Pharmacies typically print the medication instructions only on the boxes and not directly on the medication tubes.   If your medication is too expensive, please contact our office at [phone] option 4 or send us a message through MyChart.   We are unable to tell what your co-pay for  medications will be in advance as this is different depending on your insurance coverage. However, we may be able to find a substitute medication at lower cost or fill out paperwork to get insurance to cover a needed medication.   If a prior authorization is required to get your medication covered by your insurance company, please allow us 1-2 business days to complete this process.  Drug prices often vary depending on where the prescription is filled and some pharmacies may offer cheaper prices.  The website www.goodrx.com contains coupons for medications through different pharmacies. The prices here do not account for what the cost may be with help from insurance (it may be cheaper with your insurance), but the website can give you the price if you did not use any insurance.  - You can print the associated coupon and take it with your prescription to the pharmacy.  - You may also stop by our office during regular business hours and pick up a GoodRx coupon card.  - If you need your prescription sent electronically to a different pharmacy, notify our office through [facility] MyChart or by phone at [phone] option 4.  

## 2021-01-31 NOTE — Progress Notes (Signed)
   Follow-Up Visit   Subjective  [name] is a 87 y.o. female who presents for the following: Skin Cancer (Recheck recurrent SCC at left lateral foot. Bx: 11/09/2020, EDC performed 01/01/2021. Patient reports extreme pain from area. Orthopedic doctor stated labs show could also have gout. Uric acid 9.5.).    The following portions of the chart were reviewed this encounter and updated as appropriate:      Review of Systems: No other skin or systemic complaints except as noted in HPI or Assessment and Plan.   Objective  Well appearing patient in no apparent distress; mood and affect are within normal limits.  A focused examination was performed including face, left leg, left foot. Relevant physical exam findings are noted in the Assessment and Plan.  left lateral foot 2.5 cm ulceration with tender keratotic crusted plaque at edge.        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 [facility] . Okay for a Monday or Tuesday. Cannot go on a Thursday or Friday due to transportation reasons.   AK (actinic keratosis) (2) Left Ankle - lateral x1, posterior x1  Hypertrophic  Actinic keratoses are precancerous spots that appear secondary to cumulative UV radiation exposure/sun exposure over time. They are chronic with expected duration over 1 year. A portion of actinic keratoses will progress to squamous cell carcinoma of the skin. It is not possible to reliably predict which spots will progress to skin cancer and so treatment is recommended to prevent development of skin cancer.  Recommend daily broad spectrum sunscreen SPF 30+ to sun-exposed areas, reapply every 2 hours as needed.  Recommend staying in the shade or wearing long sleeves, sun glasses (UVA+UVB protection) and  wide brim hats (4-inch brim around the entire circumference of the hat). Call for new or changing lesions.  Destruction of lesion - Left Ankle - lateral x1, posterior x1  Destruction method: cryotherapy   Informed consent: discussed and consent obtained   Lesion destroyed using liquid nitrogen: Yes   Region frozen until ice ball extended beyond lesion: Yes   Outcome: patient tolerated procedure well with no complications   Post-procedure details: wound care instructions given    Return in 3 months (on 05/02/2021) for SCC foot check.  I, Emelia Salisbury, CMA, am acting as scribe for Brendolyn Patty, MD.  Documentation: I have reviewed the above documentation for accuracy and completeness, and I agree with the above.  Brendolyn Patty MD

## 2021-02-01 NOTE — Telephone Encounter (Signed)
Can someone send the contact information via Mychart to patient to call GI?

## 2021-02-01 NOTE — Telephone Encounter (Signed)
I changed this to Urgent  They have been trying to reach the patient - they called her on 01/16/21 and again on 01/30/21.   They she misses their call again or does not call back they will close her referral.   She needs to give them a call to schedule.

## 2021-02-02 NOTE — Telephone Encounter (Signed)
Message sent to patient

## 2021-02-05 ENCOUNTER — Encounter: Payer: PPO | Admitting: Dermatology

## 2021-02-08 ENCOUNTER — Encounter (HOSPITAL_COMMUNITY): Payer: Self-pay | Admitting: Emergency Medicine

## 2021-02-08 ENCOUNTER — Emergency Department (HOSPITAL_COMMUNITY): Payer: PPO

## 2021-02-08 ENCOUNTER — Other Ambulatory Visit: Payer: Self-pay

## 2021-02-08 ENCOUNTER — Inpatient Hospital Stay (HOSPITAL_COMMUNITY): Payer: PPO

## 2021-02-08 ENCOUNTER — Inpatient Hospital Stay (HOSPITAL_COMMUNITY)
Admission: EM | Admit: 2021-02-08 | Discharge: 2021-02-15 | DRG: 853 | Disposition: A | Discharge status: Home Health | Payer: PPO | Attending: Internal Medicine | Admitting: Internal Medicine

## 2021-02-08 DIAGNOSIS — I70244 Atherosclerosis of native arteries of left leg with ulceration of heel and midfoot: Secondary | ICD-10-CM | POA: Diagnosis not present

## 2021-02-08 DIAGNOSIS — I959 Hypotension, unspecified: Secondary | ICD-10-CM | POA: Diagnosis present

## 2021-02-08 DIAGNOSIS — I5022 Chronic systolic (congestive) heart failure: Secondary | ICD-10-CM | POA: Diagnosis not present

## 2021-02-08 DIAGNOSIS — I130 Hypertensive heart and chronic kidney disease with heart failure and stage 1 through stage 4 chronic kidney disease, or unspecified chronic kidney disease: Secondary | ICD-10-CM | POA: Diagnosis present

## 2021-02-08 DIAGNOSIS — I422 Other hypertrophic cardiomyopathy: Secondary | ICD-10-CM

## 2021-02-08 DIAGNOSIS — J439 Emphysema, unspecified: Secondary | ICD-10-CM

## 2021-02-08 DIAGNOSIS — E785 Hyperlipidemia, unspecified: Secondary | ICD-10-CM | POA: Diagnosis present

## 2021-02-08 DIAGNOSIS — Z7901 Long term (current) use of anticoagulants: Secondary | ICD-10-CM

## 2021-02-08 DIAGNOSIS — D62 Acute posthemorrhagic anemia: Secondary | ICD-10-CM | POA: Diagnosis not present

## 2021-02-08 DIAGNOSIS — Z905 Acquired absence of kidney: Secondary | ICD-10-CM

## 2021-02-08 DIAGNOSIS — L97929 Non-pressure chronic ulcer of unspecified part of left lower leg with unspecified severity: Secondary | ICD-10-CM | POA: Diagnosis present

## 2021-02-08 DIAGNOSIS — Z87891 Personal history of nicotine dependence: Secondary | ICD-10-CM

## 2021-02-08 DIAGNOSIS — K921 Melena: Secondary | ICD-10-CM | POA: Diagnosis not present

## 2021-02-08 DIAGNOSIS — I4821 Permanent atrial fibrillation: Secondary | ICD-10-CM | POA: Diagnosis not present

## 2021-02-08 DIAGNOSIS — R001 Bradycardia, unspecified: Secondary | ICD-10-CM | POA: Diagnosis not present

## 2021-02-08 DIAGNOSIS — Z961 Presence of intraocular lens: Secondary | ICD-10-CM | POA: Diagnosis present

## 2021-02-08 DIAGNOSIS — I071 Rheumatic tricuspid insufficiency: Secondary | ICD-10-CM | POA: Diagnosis not present

## 2021-02-08 DIAGNOSIS — Z882 Allergy status to sulfonamides status: Secondary | ICD-10-CM

## 2021-02-08 DIAGNOSIS — Z85828 Personal history of other malignant neoplasm of skin: Secondary | ICD-10-CM

## 2021-02-08 DIAGNOSIS — I2510 Atherosclerotic heart disease of native coronary artery without angina pectoris: Secondary | ICD-10-CM | POA: Diagnosis present

## 2021-02-08 DIAGNOSIS — K31811 Angiodysplasia of stomach and duodenum with bleeding: Secondary | ICD-10-CM | POA: Diagnosis not present

## 2021-02-08 DIAGNOSIS — Z888 Allergy status to other drugs, medicaments and biological substances status: Secondary | ICD-10-CM

## 2021-02-08 DIAGNOSIS — J449 Chronic obstructive pulmonary disease, unspecified: Secondary | ICD-10-CM | POA: Diagnosis present

## 2021-02-08 DIAGNOSIS — J9601 Acute respiratory failure with hypoxia: Secondary | ICD-10-CM | POA: Diagnosis not present

## 2021-02-08 DIAGNOSIS — E039 Hypothyroidism, unspecified: Secondary | ICD-10-CM | POA: Diagnosis not present

## 2021-02-08 DIAGNOSIS — Z96643 Presence of artificial hip joint, bilateral: Secondary | ICD-10-CM | POA: Diagnosis present

## 2021-02-08 DIAGNOSIS — N1832 Chronic kidney disease, stage 3b: Secondary | ICD-10-CM | POA: Diagnosis not present

## 2021-02-08 DIAGNOSIS — I428 Other cardiomyopathies: Secondary | ICD-10-CM

## 2021-02-08 DIAGNOSIS — M7989 Other specified soft tissue disorders: Secondary | ICD-10-CM | POA: Diagnosis not present

## 2021-02-08 DIAGNOSIS — L03116 Cellulitis of left lower limb: Secondary | ICD-10-CM | POA: Diagnosis not present

## 2021-02-08 DIAGNOSIS — Z85528 Personal history of other malignant neoplasm of kidney: Secondary | ICD-10-CM

## 2021-02-08 DIAGNOSIS — A419 Sepsis, unspecified organism: Principal | ICD-10-CM | POA: Diagnosis present

## 2021-02-08 DIAGNOSIS — Z9842 Cataract extraction status, left eye: Secondary | ICD-10-CM

## 2021-02-08 DIAGNOSIS — L0390 Cellulitis, unspecified: Secondary | ICD-10-CM

## 2021-02-08 DIAGNOSIS — Z79899 Other long term (current) drug therapy: Secondary | ICD-10-CM

## 2021-02-08 DIAGNOSIS — M109 Gout, unspecified: Secondary | ICD-10-CM | POA: Diagnosis not present

## 2021-02-08 DIAGNOSIS — D509 Iron deficiency anemia, unspecified: Secondary | ICD-10-CM | POA: Diagnosis not present

## 2021-02-08 DIAGNOSIS — N189 Chronic kidney disease, unspecified: Secondary | ICD-10-CM | POA: Diagnosis present

## 2021-02-08 DIAGNOSIS — Z8249 Family history of ischemic heart disease and other diseases of the circulatory system: Secondary | ICD-10-CM

## 2021-02-08 DIAGNOSIS — I70212 Atherosclerosis of native arteries of extremities with intermittent claudication, left leg: Secondary | ICD-10-CM

## 2021-02-08 DIAGNOSIS — Z803 Family history of malignant neoplasm of breast: Secondary | ICD-10-CM

## 2021-02-08 DIAGNOSIS — K219 Gastro-esophageal reflux disease without esophagitis: Secondary | ICD-10-CM | POA: Diagnosis not present

## 2021-02-08 DIAGNOSIS — Z95810 Presence of automatic (implantable) cardiac defibrillator: Secondary | ICD-10-CM | POA: Diagnosis present

## 2021-02-08 DIAGNOSIS — Z79890 Hormone replacement therapy (postmenopausal): Secondary | ICD-10-CM

## 2021-02-08 DIAGNOSIS — Z20822 Contact with and (suspected) exposure to covid-19: Secondary | ICD-10-CM | POA: Diagnosis not present

## 2021-02-08 DIAGNOSIS — Z95828 Presence of other vascular implants and grafts: Secondary | ICD-10-CM | POA: Diagnosis not present

## 2021-02-08 DIAGNOSIS — Z8673 Personal history of transient ischemic attack (TIA), and cerebral infarction without residual deficits: Secondary | ICD-10-CM

## 2021-02-08 DIAGNOSIS — Z885 Allergy status to narcotic agent status: Secondary | ICD-10-CM

## 2021-02-08 DIAGNOSIS — M25572 Pain in left ankle and joints of left foot: Secondary | ICD-10-CM | POA: Diagnosis not present

## 2021-02-08 DIAGNOSIS — E782 Mixed hyperlipidemia: Secondary | ICD-10-CM

## 2021-02-08 DIAGNOSIS — R195 Other fecal abnormalities: Secondary | ICD-10-CM | POA: Diagnosis not present

## 2021-02-08 DIAGNOSIS — M10372 Gout due to renal impairment, left ankle and foot: Secondary | ICD-10-CM

## 2021-02-08 DIAGNOSIS — D696 Thrombocytopenia, unspecified: Secondary | ICD-10-CM | POA: Diagnosis not present

## 2021-02-08 DIAGNOSIS — J441 Chronic obstructive pulmonary disease with (acute) exacerbation: Secondary | ICD-10-CM | POA: Diagnosis present

## 2021-02-08 DIAGNOSIS — I70249 Atherosclerosis of native arteries of left leg with ulceration of unspecified site: Secondary | ICD-10-CM | POA: Diagnosis present

## 2021-02-08 DIAGNOSIS — Z9841 Cataract extraction status, right eye: Secondary | ICD-10-CM

## 2021-02-08 DIAGNOSIS — N183 Chronic kidney disease, stage 3 unspecified: Secondary | ICD-10-CM | POA: Diagnosis present

## 2021-02-08 DIAGNOSIS — C7652 Malignant neoplasm of left lower limb: Secondary | ICD-10-CM | POA: Diagnosis not present

## 2021-02-08 DIAGNOSIS — F4322 Adjustment disorder with anxiety: Secondary | ICD-10-CM | POA: Diagnosis present

## 2021-02-08 DIAGNOSIS — K5521 Angiodysplasia of colon with hemorrhage: Secondary | ICD-10-CM | POA: Diagnosis not present

## 2021-02-08 DIAGNOSIS — N179 Acute kidney failure, unspecified: Secondary | ICD-10-CM | POA: Diagnosis present

## 2021-02-08 DIAGNOSIS — I517 Cardiomegaly: Secondary | ICD-10-CM | POA: Diagnosis not present

## 2021-02-08 HISTORY — DX: Presence of cardiac pacemaker: Z95.0

## 2021-02-08 LAB — COMPREHENSIVE METABOLIC PANEL
ALT: 15 U/L (ref 0–44)
AST: 18 U/L (ref 15–41)
Albumin: 4 g/dL (ref 3.5–5.0)
Alkaline Phosphatase: 46 U/L (ref 38–126)
Anion gap: 12 (ref 5–15)
BUN: 39 mg/dL — ABNORMAL HIGH (ref 8–23)
CO2: 27 mmol/L (ref 22–32)
Calcium: 9.5 mg/dL (ref 8.9–10.3)
Chloride: 96 mmol/L — ABNORMAL LOW (ref 98–111)
Creatinine, Ser: 1.63 mg/dL — ABNORMAL HIGH (ref 0.44–1.00)
GFR, Estimated: 31 mL/min — ABNORMAL LOW (ref >60)
Glucose, Bld: 124 mg/dL — ABNORMAL HIGH (ref 70–99)
Potassium: 3.9 mmol/L (ref 3.5–5.1)
Sodium: 135 mmol/L (ref 135–145)
Total Bilirubin: 0.9 mg/dL (ref 0.3–1.2)
Total Protein: 6.6 g/dL (ref 6.5–8.1)

## 2021-02-08 LAB — CBC WITH DIFFERENTIAL/PLATELET
Abs Immature Granulocytes: 0.04 10*3/uL (ref 0.00–0.07)
Basophils Absolute: 0 10*3/uL (ref 0.0–0.1)
Basophils Relative: 0 %
Eosinophils Absolute: 0 10*3/uL (ref 0.0–0.5)
Eosinophils Relative: 0 %
HCT: 32.3 % — ABNORMAL LOW (ref 36.0–46.0)
Hemoglobin: 10.2 g/dL — ABNORMAL LOW (ref 12.0–15.0)
Immature Granulocytes: 0 %
Lymphocytes Relative: 4 %
Lymphs Abs: 0.3 10*3/uL — ABNORMAL LOW (ref 0.7–4.0)
MCH: 29 pg (ref 26.0–34.0)
MCHC: 31.6 g/dL (ref 30.0–36.0)
MCV: 91.8 fL (ref 80.0–100.0)
Monocytes Absolute: 0.7 10*3/uL (ref 0.1–1.0)
Monocytes Relative: 7 %
Neutro Abs: 8 10*3/uL — ABNORMAL HIGH (ref 1.7–7.7)
Neutrophils Relative %: 89 %
Platelets: 172 10*3/uL (ref 150–400)
RBC: 3.52 MIL/uL — ABNORMAL LOW (ref 3.87–5.11)
RDW: 16.4 % — ABNORMAL HIGH (ref 11.5–15.5)
WBC: 9.1 10*3/uL (ref 4.0–10.5)
nRBC: 0 % (ref 0.0–0.2)

## 2021-02-08 LAB — URINALYSIS, ROUTINE W REFLEX MICROSCOPIC
Bilirubin Urine: NEGATIVE
Glucose, UA: NEGATIVE mg/dL
Hgb urine dipstick: NEGATIVE
Ketones, ur: NEGATIVE mg/dL
Nitrite: NEGATIVE
Protein, ur: NEGATIVE mg/dL
Specific Gravity, Urine: 1.018 (ref 1.005–1.030)
pH: 5 (ref 5.0–8.0)

## 2021-02-08 LAB — C-REACTIVE PROTEIN: CRP: 15.3 mg/dL — ABNORMAL HIGH (ref <1.0)

## 2021-02-08 LAB — RESP PANEL BY RT-PCR (FLU A&B, COVID) ARPGX2
Influenza A by PCR: NEGATIVE
Influenza B by PCR: NEGATIVE
SARS Coronavirus 2 by RT PCR: NEGATIVE

## 2021-02-08 LAB — LACTIC ACID, PLASMA
Lactic Acid, Venous: 1.5 mmol/L (ref 0.5–1.9)
Lactic Acid, Venous: 1.4 mmol/L (ref 0.5–1.9)

## 2021-02-08 LAB — ACETAMINOPHEN LEVEL: Acetaminophen (Tylenol), Serum: <10 ug/mL — ABNORMAL LOW (ref 10–30)

## 2021-02-08 LAB — SEDIMENTATION RATE: Sed Rate: 5 mm/hr (ref 0–22)

## 2021-02-08 LAB — POC OCCULT BLOOD, ED: Fecal Occult Bld: POSITIVE — AB

## 2021-02-08 MED ORDER — LIDOCAINE 5 % EX PTCH
2.0000 | MEDICATED_PATCH | CUTANEOUS | Status: DC
  Administered 2021-02-08 – 2021-02-10 (×3): 2 via TRANSDERMAL
  Filled 2021-02-08 (×4): qty 2

## 2021-02-08 MED ORDER — ACETAMINOPHEN 325 MG PO TABS
650.0000 mg | ORAL_TABLET | Freq: Once | ORAL | Status: AC
  Administered 2021-02-08: 650 mg via ORAL
  Filled 2021-02-08 (×2): qty 2

## 2021-02-08 MED ORDER — LACTATED RINGERS IV BOLUS
500.0000 mL | Freq: Once | INTRAVENOUS | Status: AC
  Administered 2021-02-08: 500 mL via INTRAVENOUS

## 2021-02-08 MED ORDER — SODIUM CHLORIDE 0.9% FLUSH
3.0000 mL | Freq: Two times a day (BID) | INTRAVENOUS | Status: DC
  Administered 2021-02-08 – 2021-02-12 (×9): 3 mL via INTRAVENOUS

## 2021-02-08 MED ORDER — LACTATED RINGERS IV BOLUS: 1000.0000 mL | Freq: Once | INTRAVENOUS | Status: DC

## 2021-02-08 MED ORDER — LACTATED RINGERS IV BOLUS
1000.0000 mL | Freq: Once | INTRAVENOUS | Status: AC
  Administered 2021-02-08: 1000 mL via INTRAVENOUS

## 2021-02-08 MED ORDER — TRAMADOL HCL 50 MG PO TABS
50.0000 mg | ORAL_TABLET | Freq: Four times a day (QID) | ORAL | Status: DC | PRN
  Administered 2021-02-10 – 2021-02-13 (×5): 50 mg via ORAL
  Filled 2021-02-08 (×6): qty 1

## 2021-02-08 MED ORDER — ALBUTEROL SULFATE (2.5 MG/3ML) 0.083% IN NEBU: 2.5000 mg | INHALATION_SOLUTION | Freq: Four times a day (QID) | RESPIRATORY_TRACT | Status: DC | PRN

## 2021-02-08 MED ORDER — TRAMADOL HCL 50 MG PO TABS: 50.0000 mg | ORAL_TABLET | Freq: Once | ORAL | Status: DC

## 2021-02-08 MED ORDER — ONDANSETRON HCL 4 MG/2ML IJ SOLN
4.0000 mg | Freq: Four times a day (QID) | INTRAMUSCULAR | Status: DC | PRN
  Administered 2021-02-10 – 2021-02-13 (×4): 4 mg via INTRAVENOUS
  Filled 2021-02-08 (×5): qty 2

## 2021-02-08 MED ORDER — SODIUM CHLORIDE 0.9 % IV SOLN: Freq: Once | INTRAVENOUS | Status: AC

## 2021-02-08 MED ORDER — LEVOTHYROXINE SODIUM 25 MCG PO TABS
25.0000 ug | ORAL_TABLET | Freq: Every day | ORAL | Status: DC
  Administered 2021-02-09 – 2021-02-15 (×6): 25 ug via ORAL
  Filled 2021-02-08 (×8): qty 1

## 2021-02-08 MED ORDER — SODIUM CHLORIDE 0.9 % IV SOLN: INTRAVENOUS | Status: DC

## 2021-02-08 MED ORDER — VANCOMYCIN HCL 1250 MG/250ML IV SOLN: 1250.0000 mg | INTRAVENOUS | Status: DC

## 2021-02-08 MED ORDER — ONDANSETRON 4 MG PO TBDP
4.0000 mg | ORAL_TABLET | Freq: Once | ORAL | Status: AC | PRN
  Administered 2021-02-08: 4 mg via ORAL
  Filled 2021-02-08: qty 1

## 2021-02-08 MED ORDER — VANCOMYCIN HCL 1250 MG/250ML IV SOLN
1250.0000 mg | Freq: Once | INTRAVENOUS | Status: AC
  Administered 2021-02-08: 1250 mg via INTRAVENOUS
  Filled 2021-02-08: qty 250

## 2021-02-08 MED ORDER — LACTATED RINGERS IV BOLUS
1000.0000 mL | Freq: Once | INTRAVENOUS | Status: AC
  Administered 2021-02-09: 1000 mL via INTRAVENOUS

## 2021-02-08 MED ORDER — FLUTICASONE PROPIONATE 50 MCG/ACT NA SUSP: 2.0000 | Freq: Every day | NASAL | Status: DC | PRN

## 2021-02-08 MED ORDER — LACTATED RINGERS IV SOLN: INTRAVENOUS | Status: DC

## 2021-02-08 MED ORDER — ACETAMINOPHEN 325 MG PO TABS
650.0000 mg | ORAL_TABLET | Freq: Four times a day (QID) | ORAL | Status: DC | PRN
  Administered 2021-02-09 – 2021-02-15 (×10): 650 mg via ORAL
  Filled 2021-02-08 (×13): qty 2

## 2021-02-08 MED ORDER — ACETAMINOPHEN 650 MG RE SUPP: 650.0000 mg | Freq: Four times a day (QID) | RECTAL | Status: DC | PRN

## 2021-02-08 MED ORDER — CEPHALEXIN 500 MG PO CAPS: 500.0000 mg | ORAL_CAPSULE | Freq: Two times a day (BID) | ORAL | 0 refills | Status: DC

## 2021-02-08 MED ORDER — DOXYCYCLINE HYCLATE 100 MG PO CAPS: 100.0000 mg | ORAL_CAPSULE | Freq: Two times a day (BID) | ORAL | 0 refills | Status: DC

## 2021-02-08 MED ORDER — SODIUM CHLORIDE 0.9 % IV SOLN
2.0000 g | INTRAVENOUS | Status: AC
  Administered 2021-02-08 – 2021-02-11 (×4): 2 g via INTRAVENOUS
  Filled 2021-02-08 (×5): qty 2

## 2021-02-08 MED ORDER — ONDANSETRON HCL 4 MG PO TABS
4.0000 mg | ORAL_TABLET | Freq: Four times a day (QID) | ORAL | Status: DC | PRN
  Filled 2021-02-08: qty 1

## 2021-02-08 MED ORDER — TRAMADOL 5 MG/ML ORAL SUSPENSION: 50.0000 mg | Freq: Once | ORAL | Status: DC

## 2021-02-08 MED ORDER — PANTOPRAZOLE SODIUM 40 MG PO TBEC
40.0000 mg | DELAYED_RELEASE_TABLET | Freq: Every day | ORAL | Status: DC
  Administered 2021-02-08 – 2021-02-09 (×2): 40 mg via ORAL
  Filled 2021-02-08 (×2): qty 1

## 2021-02-08 MED ORDER — ROSUVASTATIN CALCIUM 20 MG PO TABS
40.0000 mg | ORAL_TABLET | Freq: Every day | ORAL | Status: DC
  Administered 2021-02-08 – 2021-02-15 (×8): 40 mg via ORAL
  Filled 2021-02-08 (×8): qty 2

## 2021-02-08 NOTE — H&P (View-Only) (Signed)
[facility] [place]  ASSESSMENT / PLAN: [name] is a 87 y.o. female with atherosclerosis of native arteries of bilateral lower extremities causing  poorly healing biopsy site of left foot  with mild cellulitis.   Recommend the following which can slow the progression of atherosclerosis and reduce the risk of major adverse cardiac / limb events:  Complete cessation from all tobacco products. Blood glucose control with goal A1c < 7%. Blood pressure control with goal blood pressure < 140/90 mmHg. Lipid reduction therapy with goal LDL-C <100 mg/dL (<70 if symptomatic from PAD).  Aspirin 81mg  PO QD.  Atorvastatin 40-80mg  PO QD (or other "high intensity" statin therapy).  She was  maintained on Eliquis. Will need to hold this 48 hours prior to angiogram. Plan left lower extremity angiogram with possible intervention 02/12/21.   CHIEF COMPLAINT: malaise  HISTORY OF PRESENT ILLNESS: [name] is a 87 y.o. female admitted to the internal medicine service with fatigue, malaise, fever, and a nonhealing wound to her left lower extremity.  The patient reports a long history of intermittent claudication.  She is a bit confused on exam and reports to me that she has had a wound on her left lateral ankle since August.  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  She has seen my partner in the past, and has a known history of severe peripheral arterial disease.  The patient reports severe, unremitting pain in the left foot over the past several days.  She does have some mild redness about the ankle concerning for cellulitis.  VASCULAR SURGICAL HISTORY: none  VASCULAR RISK FACTORS: Positive history of stroke / transient ischemic attack. Negative history of coronary artery disease.  Negative history of diabetes mellitus. Last A1c 6.1 (2016). Positive history of smoking. Not actively  smoking. Positive history of hypertension.  Positive history of chronic kidney disease.  Last GFR 31. CKD stage 3 - GFR 30-59. Positive history of chronic obstructive pulmonary disease. Not on home O2.  FUNCTIONAL STATUS: ECOG performance status: (1) Restricted in physically strenuous activity, ambulatory and able to do work of light nature Ambulatory status: Ambulatory within the community with limits  Past Medical History:  Diagnosis Date   Actinic keratosis 01/17/2015   R forearm   Adjustment disorder with anxiety    Anemia    Anginal pain ([place])    Arthritis    "some in my hands" (11/11/2012)   Atrial fibrillation (HCC)    Atypical mole 03/25/2018   L forearm - severe   Automatic implantable cardioverter-defibrillator in situ    Blood in stool    Carotid artery stenosis 09/2007   a. 09/2007: 60-79% bilateral (stable); b. 10/2008: 40-59% R 60-79%    Chest pain, unspecified    Chronic airway obstruction, not elsewhere classified    Chronic bronchitis ([place])    "get it some; not q year" (11/11/2012)   Chronic diastolic CHF (congestive heart failure) ([place]) 06/04/2013   Chronic kidney disease, unspecified    Coronary artery disease    non-obstructive by 2006 cath   Displaced fracture of left femoral neck ([place]) 10/09/2018   Frequent UTI    "get them a couple times/yr" (11/11/2012)   GI bleed 03/28/2020   Hemorrhage of rectum and anus    High cholesterol    History of blood transfusion 04/2011   "after hip OR" (11/11/2012)   Hx of renal cell cancer  Hypertension 05/20/2011   Hypertr obst cardiomyop    Hypotension, unspecified    cardiac cath 2006..nonobstructive CAD 30-40s lesions.Marland KitchenETT 1/09 nondiagnostic due to poor HR response..Right Renal Cancer 2003   Long term (current) use of anticoagulants    Malignant neoplasm of kidney, except pelvis    Osteoarthritis of right hip    Other and unspecified coagulation defects    PONV (postoperative nausea and vomiting)    RECTAL BLEEDING  10/13/2009   Qualifier: Diagnosis of  By: Chester Holstein NP, Paula     Renal cancer [facility]) 06/2001   Right   Secondary cardiomyopathy, unspecified    Sinus bradycardia    Squamous cell carcinoma of skin 01/17/2015   R lat wrist   Squamous cell carcinoma of skin 01/29/2018   R post upper leg - superficially invasive   Squamous cell carcinoma of skin 03/25/2018   L lat foot   Squamous cell carcinoma of skin 11/09/2020   left lat foot - [facility] 01/01/21   Urge incontinence     Past Surgical History:  Procedure Laterality Date   ABDOMINAL HYSTERECTOMY  1975   for benign causes   APPENDECTOMY     BI-VENTRICULAR PACEMAKER UPGRADE  05/04/2010   BIOPSY  02/28/2019   Procedure: BIOPSY;  Surgeon: Milus Banister, MD;  Location: [place];  Service: Endoscopy;;   CARDIAC CATHETERIZATION  2006   CARDIOVERSION N/A 02/20/2018   Procedure: CARDIOVERSION;  Surgeon: Minna Merritts, MD;  Location: [place] [facility];  Service: Cardiovascular;  Laterality: N/A;   CARDIOVERSION N/A 03/27/2018   Procedure: CARDIOVERSION;  Surgeon: Minna Merritts, MD;  Location: [facility];  Service: Cardiovascular;  Laterality: N/A;   CATARACT EXTRACTION W/ INTRAOCULAR LENS  IMPLANT, BILATERAL  01/2006-02-2006   CHOLECYSTECTOMY N/A 11/11/2012   Procedure: LAPAROSCOPIC CHOLECYSTECTOMY WITH INTRAOPERATIVE CHOLANGIOGRAM;  Surgeon: Imogene Burn. Georgette Dover, MD;  Location: [place];  Service: General;  Laterality: N/A;   COLONOSCOPY WITH PROPOFOL N/A 02/28/2019   Procedure: COLONOSCOPY WITH PROPOFOL;  Surgeon: Milus Banister, MD;  Location: [facility] ENDOSCOPY;  Service: Endoscopy;  Laterality: N/A;   ENTEROSCOPY N/A 03/30/2020   Procedure: ENTEROSCOPY;  Surgeon: Lavena Bullion, DO;  Location: [place];  Service: Gastroenterology;  Laterality: N/A;   EP IMPLANTABLE DEVICE N/A 02/21/2016   Procedure: ICD Generator Changeout;  Surgeon: Deboraha Sprang, MD;  Location: [place] [facility];  Service: Cardiovascular;  Laterality: N/A;    ESOPHAGOGASTRODUODENOSCOPY (EGD) WITH PROPOFOL N/A 02/28/2019   Procedure: ESOPHAGOGASTRODUODENOSCOPY (EGD) WITH PROPOFOL;  Surgeon: Milus Banister, MD;  Location: [facility] ENDOSCOPY;  Service: Endoscopy;  Laterality: N/A;   GIVENS CAPSULE STUDY N/A 03/15/2019   Procedure: GIVENS CAPSULE STUDY;  Surgeon: Thornton Park, MD;  Location: [place];  Service: Gastroenterology;  Laterality: N/A;   HOT HEMOSTASIS N/A 03/30/2020   Procedure: HOT HEMOSTASIS (ARGON PLASMA COAGULATION/BICAP);  Surgeon: Lavena Bullion, DO;  Location: [facility] ENDOSCOPY;  Service: Gastroenterology;  Laterality: N/A;   INSERT / REPLACE / REMOVE PACEMAKER  05-01-11   02-28-05-/05-04-10-ICD-MEDTRONIC MAXIMAL DR   JOINT REPLACEMENT     LAPAROSCOPIC CHOLECYSTECTOMY  11/11/2012   LAPAROSCOPIC LYSIS OF ADHESIONS N/A 11/11/2012   Procedure: LAPAROSCOPIC LYSIS OF ADHESIONS;  Surgeon: Imogene Burn. Georgette Dover, MD;  Location: [place];  Service: General;  Laterality: N/A;   NEPHRECTOMY Right 06/2001    S/P RENAL CELL CANCER   PRESSURE SENSOR/CARDIOMEMS N/A 02/03/2019   Procedure: PRESSURE SENSOR/CARDIOMEMS;  Surgeon: Larey Dresser, MD;  Location: [place] [facility];  Service: Cardiovascular;  Laterality: N/A;  RIGHT HEART CATH N/A 11/09/2018   Procedure: RIGHT HEART CATH;  Surgeon: Larey Dresser, MD;  Location: [place] [facility];  Service: Cardiovascular;  Laterality: N/A;   RIGHT HEART CATH N/A 03/08/2019   Procedure: RIGHT HEART CATH;  Surgeon: Larey Dresser, MD;  Location: [place] [facility];  Service: Cardiovascular;  Laterality: N/A;   SUBMUCOSAL TATTOO INJECTION  02/28/2019   Procedure: SUBMUCOSAL TATTOO INJECTION;  Surgeon: Milus Banister, MD;  Location: [facility] ENDOSCOPY;  Service: Endoscopy;;   TOTAL HIP ARTHROPLASTY Right 05/03/2011   Procedure: TOTAL HIP ARTHROPLASTY ANTERIOR APPROACH;  Surgeon: Mcarthur Rossetti;  Location: [facility];  Service: Orthopedics;  Laterality: Right;  Removal of Cannulated Screws Right Hip, Right Direct  Anterior Hip Replacement   TOTAL HIP ARTHROPLASTY Left 10/09/2018   Procedure: TOTAL HIP ARTHROPLASTY ANTERIOR APPROACH;  Surgeon: Rod Can, MD;  Location: [place];  Service: Orthopedics;  Laterality: Left;    Family History  Problem Relation Age of Onset   Heart failure Mother    Early death Father        car accident   Breast cancer Maternal Aunt 82   Breast cancer Cousin    Breast cancer Other    Colon cancer Neg Hx     Social History   Socioeconomic History   Marital status: Widowed    Spouse name: Not on file   Number of children: 2   Years of education: high school   Highest education level: Not on file  Occupational History   Occupation: Retired    Fish farm manager: RETIRED  Tobacco Use   Smoking status: Former    Packs/day: 0.50    Years: 40.00    Pack years: 20.00    Types: Cigarettes    Quit date: 05/06/2001    Years since quitting: 19.7   Smokeless tobacco: Never  Vaping Use   Vaping Use: Never used  Substance and Sexual Activity   Alcohol use: No    Alcohol/week: 0.0 standard drinks   Drug use: No   Sexual activity: Not Currently  Other Topics Concern   Not on file  Social History Narrative   Would desire CPR   07/11/20   From: the area   Living: with great-grandson - [name] (2003)   Work: retired - clerical      Family: 2 children - [name] and Farrell - 2 grand chlidren - 5 great grand children      Enjoys: spending time with friends - watch move, eat out, spend time      Exercise: walking around the house   Diet: not good, limits red meat, limits salt, low appetite      Safety   Seat belts: Yes    Guns: Yes  and secure   Safe in relationships: Yes    Social Determinants of Health   Financial Resource Strain: Not on file  Food Insecurity: Not on file  Transportation Needs: Not on file  Physical Activity: Not on file  Stress: Not on file  Social Connections: Not on file  Intimate Partner Violence: Not on file    Allergies  Allergen  Reactions   Dilaudid [Hydromorphone] Other (See Comments)    Excessive Somnolence- Required Narcan   Fentanyl Other (See Comments)    Required Narcan when given with Dilaudid. Tolerated single dose when given during procedure 02/03/2019.    Codeine Nausea And Vomiting    Hallucinations    Morphine And Related Nausea And Vomiting    Hallucinations  Sulfonamide Derivatives Other (See Comments)    Dry mouth    Current Facility-Administered Medications  Medication Dose Route Frequency Provider Last Rate Last Admin   acetaminophen (TYLENOL) tablet 650 mg  650 mg Oral Q6H PRN Norval Morton, MD       Or   acetaminophen (TYLENOL) suppository 650 mg  650 mg Rectal Q6H PRN Fuller Plan A, MD       albuterol (PROVENTIL) (2.5 MG/3ML) 0.083% nebulizer solution 2.5 mg  2.5 mg Nebulization Q6H PRN Smith, Rondell A, MD       ceFEPIme (MAXIPIME) 2 g in sodium chloride 0.9 % 100 mL IVPB  2 g Intravenous Q24H Gareth [name], MD   Stopped at 02/08/21 1246   ondansetron (ZOFRAN) tablet 4 mg  4 mg Oral Q6H PRN Norval Morton, MD       Or   ondansetron (ZOFRAN) injection 4 mg  4 mg Intravenous Q6H PRN Smith, Rondell A, MD       sodium chloride flush (NS) 0.9 % injection 3 mL  3 mL Intravenous Q12H Smith, Rondell A, MD       vancomycin (VANCOREADY) IVPB 1250 mg/250 mL  1,250 mg Intravenous Once Gareth [name], MD 166.7 mL/hr at 02/08/21 1243 1,250 mg at 02/08/21 1243   Current Outpatient Medications  Medication Sig Dispense Refill   cephALEXin (KEFLEX) 500 MG capsule Take 1 capsule (500 mg total) by mouth 2 (two) times daily. 14 capsule 0   doxycycline (VIBRAMYCIN) 100 MG capsule Take 1 capsule (100 mg total) by mouth 2 (two) times daily. One po bid x 7 days 14 capsule 0   acetaminophen (TYLENOL) 500 MG tablet Take 500-1,000 mg by mouth every 8 (eight) hours as needed for mild pain or headache.      apixaban (ELIQUIS) 2.5 MG TABS tablet Take 1 tablet (2.5 mg total) by mouth 2 (two) times daily.  Dispense starter pack 60 tablet 11   cetirizine (ZYRTEC) 10 MG tablet Take 10 mg by mouth daily. As needed     Cholecalciferol (VITAMIN D) 50 MCG (2000 UT) CAPS Take 4,000 Units by mouth daily.     colchicine 0.6 MG tablet Take 2 pills followed by 1 pill 1 hour later. Then if no improvement in 2 days take 1 pill daily for up to 3 days. 10 tablet 0   Cranberry (AZO CRANBERRY GUMMIES) 500 MG CHEW Chew 2,000 mg by mouth daily.     Cyanocobalamin (B-12 PO) Take 1 tablet by mouth daily.      ezetimibe (ZETIA) 10 MG tablet Take 1 tablet (10 mg total) by mouth daily. 30 tablet 11   ferrous sulfate 325 (65 FE) MG tablet Take 1 tablet (325 mg total) by mouth 2 (two) times daily with a meal. 60 tablet 0   fluticasone (FLONASE) 50 MCG/ACT nasal spray Place 2 sprays into both nostrils daily as needed for allergies or rhinitis. 16 g 6   levothyroxine (SYNTHROID) 25 MCG tablet TAKE 1 TABLET BY MOUTH ONCE DAILY BEFOREBREAKFAST 30 tablet 3   losartan (COZAAR) 25 MG tablet Take 0.5 tablets (12.5 mg total) by mouth daily. 90 tablet 3   magnesium oxide (MAG-OX) 400 MG tablet Take 1 tablet (400 mg total) by mouth daily. 30 tablet 2   metolazone (ZAROXOLYN) 2.5 MG tablet Take 2.5 mg by mouth as needed.     mupirocin ointment (BACTROBAN) 2 % Apply 1 application topically daily. Qd to wound on foot 22 g 1   ondansetron (ZOFRAN)  4 MG tablet Take 1 tablet (4 mg total) by mouth every 8 (eight) hours as needed for nausea or vomiting. 20 tablet 1   pantoprazole (PROTONIX) 40 MG tablet TAKE 1 TABLET (40 MG TOTAL) BY MOUTH DAILY AT 6 (SIX) AM. 30 tablet 0   potassium chloride SA (KLOR-CON M20) 20 MEQ tablet Take 2 tablets (40 mEq total) by mouth 2 (two) times daily. Take 1 extra tablet on the days you take metolazone. 360 tablet 3   rosuvastatin (CRESTOR) 40 MG tablet Take 1 tablet (40 mg total) by mouth daily. 90 tablet 3   torsemide (DEMADEX) 100 MG tablet TAKE 1 TABLET BY MOUTH EVERY MORNING (EVENING DOSE 80 MG) 30 tablet 11    torsemide (DEMADEX) 20 MG tablet Take 4 tablets (80 mg total) by mouth every evening. To be taken with 100 mg tab in the AM 120 tablet 11   vitamin C (ASCORBIC ACID) 250 MG tablet Take 500 mg by mouth daily.      REVIEW OF SYSTEMS:  [X]  denotes positive finding, [ ]  denotes negative finding Cardiac  Comments:  Chest pain or chest pressure:    Shortness of breath upon exertion:    Short of breath when lying flat:    Irregular heart rhythm:        Vascular    Pain in calf, thigh, or hip brought on by ambulation:    Pain in feet at night that wakes you up from your sleep:     Blood clot in your veins:    Leg swelling:         Pulmonary    Oxygen at home:    Productive cough:     Wheezing:         Neurologic    Sudden weakness in arms or legs:     Sudden numbness in arms or legs:     Sudden onset of difficulty speaking or slurred speech:    Temporary loss of vision in one eye:     Problems with dizziness:         Gastrointestinal    Blood in stool:     Vomited blood:         Genitourinary    Burning when urinating:     Blood in urine:        Psychiatric    Major depression:         Hematologic    Bleeding problems:    Problems with blood clotting too easily:        Skin    Rashes or ulcers:        Constitutional    Fever or chills:      PHYSICAL EXAM Vitals:   02/08/21 1045 02/08/21 1115 02/08/21 1200 02/08/21 1230  BP: (!) 109/51 (!) 135/47 124/65 131/80  Pulse: 74 64 73 93  Resp: (!) 24 (!) 24 17 19   Temp:      TempSrc:      SpO2: 97% 99% 100% 93%  Weight:      Height:        Constitutional: chronically ill. Appears unwell. no distress. Appears well nourished.  Neurologic: CN intact. no focal findings. no sensory loss. Psychiatric:  Mood and affect symmetric and appropriate. Eyes:  No icterus. No conjunctival pallor. Ears, nose, throat:  mucous membranes moist. Midline trachea.  Cardiac: regular rate and rhythm.  Respiratory:   unlabored. Abdominal:  soft, non-tender, non-distended.  Peripheral vascular: 1+ femoral pulses bilaterally. No palpable pedal  pulses. Left lateral foot ulceration. Chronic venous insufficiency (reticular veins, telangiectasias). Extremity: No edema. No cyanosis. No pallor.  Skin: No gangrene.     Lymphatic: No Stemmer's sign. No palpable lymphadenopathy.  PERTINENT LABORATORY AND RADIOLOGIC DATA  Most recent CBC CBC Latest Ref Rng & Units 02/08/2021 01/16/2021 12/19/2020  WBC 4.0 - 10.5 K/uL 9.1 3.3(L) 3.5(L)  Hemoglobin 12.0 - 15.0 g/dL 10.2(L) 10.5(L) 10.7(L)  Hematocrit 36.0 - 46.0 % 32.3(L) 32.0(L) 35.6(L)  Platelets 150 - 400 K/uL 172 190.0 192     Most recent CMP CMP Latest Ref Rng & Units 02/08/2021 01/30/2021 12/19/2020  Glucose 70 - 99 mg/dL 124(H) 92 91  BUN 8 - 23 mg/dL 39(H) 36(H) 24(H)  Creatinine 0.44 - 1.00 mg/dL 1.63(H) 1.44(H) 1.57(H)  Sodium 135 - 145 mmol/L 135 139 137  Potassium 3.5 - 5.1 mmol/L 3.9 4.4 4.0  Chloride 98 - 111 mmol/L 96(L) 101 98  CO2 22 - 32 mmol/L 27 28 30   Calcium 8.9 - 10.3 mg/dL 9.5 9.5 9.3  Total Protein 6.5 - 8.1 g/dL 6.6 - -  Total Bilirubin 0.3 - 1.2 mg/dL 0.9 - -  Alkaline Phos 38 - 126 U/L 46 - -  AST 15 - 41 U/L 18 - -  ALT 0 - 44 U/L 15 - -    Renal function Estimated Creatinine Clearance: 24.5 mL/min (A) (by C-G formula based on SCr of 1.63 mg/dL (H)).  Hgb A1c MFr Bld (%)  Date Value  08/17/2014 6.1 (H)    Ldl Cholesterol, Calc  Date Value Ref Range Status  06/09/2014 183 (H) 0 - 100 mg/dL Final   LDL Cholesterol  Date Value Ref Range Status  06/03/2019 79 0 - 99 mg/dL Final    Comment:           Total Cholesterol/HDL:CHD Risk Coronary Heart Disease Risk Table                     Men   Women  1/2 Average Risk   3.4   3.3  Average Risk       5.0   4.4  2 X Average Risk   9.6   7.1  3 X Average Risk  23.4   11.0        Use the calculated Patient Ratio above and the CHD Risk Table to determine the patient's CHD  Risk.        ATP III CLASSIFICATION (LDL):  <100     mg/dL   Optimal  100-129  mg/dL   Near or Above                    Optimal  130-159  mg/dL   Borderline  160-189  mg/dL   High  >190     mg/dL   Very High Performed at [facility] [address] [place], [place] [address]    Direct LDL  Date Value Ref Range Status  11/07/2016 146.0 mg/dL Final    Comment:    Optimal:  <100 mg/dLNear or Above Optimal:  100-129 mg/dLBorderline High:  130-159 mg/dLHigh:  160-189 mg/dLVery High:  >190 mg/dL    [name]. Stanford Breed, MD [facility] Phone Number: [phone] 02/08/2021 1:23 PM  Total time spent on preparing this encounter including chart review, data review, collecting history, examining the patient, coordinating care for this established patient, >40 minutes.  Portions of this report may have been transcribed  using voice recognition software.  Every effort has been made to ensure accuracy; however, inadvertent computerized transcription errors may still be present.

## 2021-02-08 NOTE — ED Notes (Signed)
Interrogated [employer] pacemaker.  Received call from [employer] rep stating that the [place] system was down so they are not able to receive or see results. Everything appeared to transmit successfully from the [facility]. They will check into it more in the morning and let us know what needs to happen.

## 2021-02-08 NOTE — ED Provider Notes (Signed)
[facility] EMERGENCY DEPARTMENT Provider Note   CSN: [id number] Arrival date & time: 02/08/21  0142     History Chief Complaint  Patient presents with   Wound Check    [name] is a 87 y.o. female.  The history is provided by the patient.  Wound Check This is a new problem. The problem occurs constantly. Associated symptoms include headaches and shortness of breath. Pertinent negatives include no chest pain and no abdominal pain. Nothing aggravates the symptoms. Nothing relieves the symptoms.  Patient with extensive history including atrial fibrillation, nonischemic cardiomyopathy presents with wound check and feeling fatigued.  Patient reports she has a cancerous lesion to her left ankle and foot and had a procedure done last month and since that time it has scabbed over multiple times and has been very painful.  She does not report any new changes in appearance. Over the past 1-2 days she has felt fatigue and feverish, with temperature up to 100.9.  She has had mild cough and shortness of breath.  She has had vomiting but no diarrhea.  No active chest or abdominal pain. She reports mild headache.  She denies any other new rashes on her skin other than the lesions on her left foot.   Patient is a non-smoker.  She is not on home oxygen.  Past Medical History:  Diagnosis Date   Actinic keratosis 01/17/2015   R forearm   Adjustment disorder with anxiety    Anemia    Anginal pain ([place])    Arthritis    "some in my hands" (11/11/2012)   Atrial fibrillation (HCC)    Atypical mole 03/25/2018   L forearm - severe   Automatic implantable cardioverter-defibrillator in situ    Blood in stool    Carotid artery stenosis 09/2007   a. 09/2007: 60-79% bilateral (stable); b. 10/2008: 40-59% R 60-79%    Chest pain, unspecified    Chronic airway obstruction, not elsewhere classified    Chronic bronchitis ([place])    "get it some; not q year" (11/11/2012)   Chronic  diastolic CHF (congestive heart failure) ([place]) 06/04/2013   Chronic kidney disease, unspecified    Coronary artery disease    non-obstructive by 2006 cath   Displaced fracture of left femoral neck ([place]) 10/09/2018   Frequent UTI    "get them a couple times/yr" (11/11/2012)   GI bleed 03/28/2020   Hemorrhage of rectum and anus    High cholesterol    History of blood transfusion 04/2011   "after hip OR" (11/11/2012)   Hx of renal cell cancer    Hypertension 05/20/2011   Hypertr obst cardiomyop    Hypotension, unspecified    cardiac cath 2006..nonobstructive CAD 30-40s lesions.Marland KitchenETT 1/09 nondiagnostic due to poor HR response..Right Renal Cancer 2003   Long term (current) use of anticoagulants    Malignant neoplasm of kidney, except pelvis    Osteoarthritis of right hip    Other and unspecified coagulation defects    PONV (postoperative nausea and vomiting)    RECTAL BLEEDING 10/13/2009   Qualifier: Diagnosis of  By: Chester Holstein NP, Paula     Renal cancer [facility]) 06/2001   Right   Secondary cardiomyopathy, unspecified    Sinus bradycardia    Squamous cell carcinoma of skin 01/17/2015   R lat wrist   Squamous cell carcinoma of skin 01/29/2018   R post upper leg - superficially invasive   Squamous cell carcinoma of skin 03/25/2018   L lat foot  Squamous cell carcinoma of skin 11/09/2020   left lat foot - [facility] 01/01/21   Urge incontinence     Patient Active Problem List   Diagnosis Date Noted   Acute gout due to renal impairment involving left foot 01/30/2021   Elevated uric acid in blood 01/30/2021   Orthostatic hypotension 12/05/2020   Vertigo 12/05/2020   Acute nonintractable headache 10/05/2020   Cellulitis of left lower extremity 07/13/2020   Skin lesion of foot 07/13/2020   History of blood transfusion reaction 04/17/2020   AVM (arteriovenous malformation) of small bowel, acquired with hemorrhage    Gastric polyps    Supratherapeutic INR 03/27/2020   Hypothyroidism 03/27/2020    Palliative care by specialist    Goals of care, counseling/discussion    Iron deficiency anemia 01/06/2020   History of GI bleed 11/11/2019   Chronic venous hypertension w ulceration ([place])    Symptomatic anemia 02/25/2019   Acute on chronic diastolic CHF (congestive heart failure) ([place]) 11/04/2018   Chronic bilateral pleural effusions 11/04/2018   Afib ([place]) 06/01/2018   Iliac artery stenosis, bilateral ([place]) 07/14/2017   Pulmonary hypertension ([place]) 12/04/2015   B12 deficiency 05/22/2015   PAD (peripheral artery disease) ([place])    History of nephrectomy secondary to [place]    TIA (transient ischemic attack) 08/16/2014   Chronic diastolic CHF (congestive heart failure) ([place]) 06/04/2013   Mitral valve regurgitation 06/04/2013   Allergic rhinitis 01/01/2013   Chronic cholecystitis with calculus 09/15/2012   Claudication ([place]) 01/21/2012   Intermittent lightheadedness 11/26/2011   Hypertension 05/20/2011   Avascular necrosis of hip ([place]) 05/03/2011   OTHER AND UNSPECIFIED COAGULATION DEFECTS 10/13/2009   HOCM / IHSS 07/14/2009   Mixed hyperlipidemia 04/10/2009   INCONTINENCE, URGE 04/10/2009   Non-ischemic cardiomyopathy ([place]) 10/27/2008   Permanent atrial fibrillation 10/27/2008   SINUS BRADYCARDIA 10/27/2008   Carotid artery stenosis 10/27/2008   COPD (chronic obstructive pulmonary disease) ([place]) 10/27/2008   Chronic kidney disease, stage 3b ([place]) 10/27/2008   ICD (implantable cardioverter-defibrillator) in place 10/27/2008    Past Surgical History:  Procedure Laterality Date   ABDOMINAL HYSTERECTOMY  1975   for benign causes   APPENDECTOMY     BI-VENTRICULAR PACEMAKER UPGRADE  05/04/2010   BIOPSY  02/28/2019   Procedure: BIOPSY;  Surgeon: Milus Banister, MD;  Location: [place];  Service: Endoscopy;;   CARDIAC CATHETERIZATION  2006   CARDIOVERSION N/A 02/20/2018   Procedure: CARDIOVERSION;  Surgeon: Minna Merritts, MD;  Location: [facility] ORS;  Service:  Cardiovascular;  Laterality: N/A;   CARDIOVERSION N/A 03/27/2018   Procedure: CARDIOVERSION;  Surgeon: Minna Merritts, MD;  Location: [facility];  Service: Cardiovascular;  Laterality: N/A;   CATARACT EXTRACTION W/ INTRAOCULAR LENS  IMPLANT, BILATERAL  01/2006-02-2006   CHOLECYSTECTOMY N/A 11/11/2012   Procedure: LAPAROSCOPIC CHOLECYSTECTOMY WITH INTRAOPERATIVE CHOLANGIOGRAM;  Surgeon: Imogene Burn. Georgette Dover, MD;  Location: [place];  Service: General;  Laterality: N/A;   COLONOSCOPY WITH PROPOFOL N/A 02/28/2019   Procedure: COLONOSCOPY WITH PROPOFOL;  Surgeon: Milus Banister, MD;  Location: [facility] ENDOSCOPY;  Service: Endoscopy;  Laterality: N/A;   ENTEROSCOPY N/A 03/30/2020   Procedure: ENTEROSCOPY;  Surgeon: Lavena Bullion, DO;  Location: [place];  Service: Gastroenterology;  Laterality: N/A;   EP IMPLANTABLE DEVICE N/A 02/21/2016   Procedure: ICD Generator Changeout;  Surgeon: Deboraha Sprang, MD;  Location: [place] CV LAB;  Service: Cardiovascular;  Laterality: N/A;   ESOPHAGOGASTRODUODENOSCOPY (EGD) WITH PROPOFOL N/A 02/28/2019   Procedure: ESOPHAGOGASTRODUODENOSCOPY (EGD) WITH PROPOFOL;  Surgeon: Milus Banister, MD;  Location[facility] ENDOSCOPY;  Service: Endoscopy;  Laterality: N/A;   GIVENS CAPSULE STUDY N/A 03/15/2019   Procedure: GIVENS CAPSULE STUDY;  Surgeon: Thornton Park, MD;  Location: [place];  Service: Gastroenterology;  Laterality: N/A;   HOT HEMOSTASIS N/A 03/30/2020   Procedure: HOT HEMOSTASIS (ARGON PLASMA COAGULATION/BICAP);  Surgeon: Lavena Bullion, DO;  Location[facility]COPY;  Service: Gastroenterology;  Laterality: N/A;   INSERT / REPLACE / REMOVE PACEMAKER  05-01-11   02-28-05-/05-04-10-ICD-MEDTRONIC MAXIMAL DR   JOINT REPLACEMENT     LAPAROSCOPIC CHOLECYSTECTOMY  11/11/2012   LAPAROSCOPIC LYSIS OF ADHESIONS N/A 11/11/2012   Procedure: LAPAROSCOPIC LYSIS OF ADHESIONS;  Surgeon: Imogene Burn. Georgette Dover, MD;  Location: [place];  Service: General;  Laterality: N/A;    NEPHRECTOMY Right 06/2001    S/P RENAL CELL CANCER   PRESSURE SENSOR/CARDIOMEMS N/A 02/03/2019   Procedure: PRESSURE SENSOR/CARDIOMEMS;  Surgeon: Larey Dresser, MD;  Location: [place] CV LAB;  Service: Cardiovascular;  Laterality: N/A;   RIGHT HEART CATH N/A 11/09/2018   Procedure: RIGHT HEART CATH;  Surgeon: Larey Dresser, MD;  Location: [place] CV LAB;  Service: Cardiovascular;  Laterality: N/A;   RIGHT HEART CATH N/A 03/08/2019   Procedure: RIGHT HEART CATH;  Surgeon: Larey Dresser, MD;  Location: [place] CV LAB;  Service: Cardiovascular;  Laterality: N/A;   SUBMUCOSAL TATTOO INJECTION  02/28/2019   Procedure: SUBMUCOSAL TATTOO INJECTION;  Surgeon: Milus Banister, MD;  Location: [facility] ENDOSCOPY;  Service: Endoscopy;;   TOTAL HIP ARTHROPLASTY Right 05/03/2011   Procedure: TOTAL HIP ARTHROPLASTY ANTERIOR APPROACH;  Surgeon: Mcarthur Rossetti;  Location: [facility];  Service: Orthopedics;  Laterality: Right;  Removal of Cannulated Screws Right Hip, Right Direct Anterior Hip Replacement   TOTAL HIP ARTHROPLASTY Left 10/09/2018   Procedure: TOTAL HIP ARTHROPLASTY ANTERIOR APPROACH;  Surgeon: Rod Can, MD;  Location: [place];  Service: Orthopedics;  Laterality: Left;     OB History   No obstetric history on file.     Family History  Problem Relation Age of Onset   Heart failure Mother    Early death Father        car accident   Breast cancer Maternal Aunt 82   Breast cancer Cousin    Breast cancer Other    Colon cancer Neg Hx     Social History   Tobacco Use   Smoking status: Former    Packs/day: 0.50    Years: 40.00    Pack years: 20.00    Types: Cigarettes    Quit date: 05/06/2001    Years since quitting: 19.7   Smokeless tobacco: Never  Vaping Use   Vaping Use: Never used  Substance Use Topics   Alcohol use: No    Alcohol/week: 0.0 standard drinks   Drug use: No    Home Medications Prior to Admission medications   Medication Sig Start Date End  Date Taking? Authorizing Provider  acetaminophen (TYLENOL) 500 MG tablet Take 500-1,000 mg by mouth every 8 (eight) hours as needed for mild pain or headache.     [provider]  apixaban (ELIQUIS) 2.5 MG TABS tablet Take 1 tablet (2.5 mg total) by mouth 2 (two) times daily. Dispense starter pack 05/17/20   Larey Dresser, MD  cetirizine (ZYRTEC) 10 MG tablet Take 10 mg by mouth daily. As needed    [provider]  Cholecalciferol (VITAMIN D) 50 MCG (2000 UT) CAPS Take 4,000 Units by mouth  daily.    [provider]  colchicine 0.6 MG tablet Take 2 pills followed by 1 pill 1 hour later. Then if no improvement in 2 days take 1 pill daily for up to 3 days. 01/30/21   Lesleigh Noe, MD  Cranberry (AZO CRANBERRY GUMMIES) 500 MG CHEW Chew 2,000 mg by mouth daily.    [provider]  Cyanocobalamin (B-12 PO) Take 1 tablet by mouth daily.     [provider]  ezetimibe (ZETIA) 10 MG tablet Take 1 tablet (10 mg total) by mouth daily. 06/21/20   Milford, Maricela Bo, FNP  ferrous sulfate 325 (65 FE) MG tablet Take 1 tablet (325 mg total) by mouth 2 (two) times daily with a meal. 03/17/19   Shelly Coss, MD  fluticasone (FLONASE) 50 MCG/ACT nasal spray Place 2 sprays into both nostrils daily as needed for allergies or rhinitis. 07/27/19   Elby Beck, FNP  levothyroxine (SYNTHROID) 25 MCG tablet TAKE 1 TABLET BY MOUTH ONCE DAILY BEFOREBREAKFAST 04/19/19   Deboraha Sprang, MD  losartan (COZAAR) 25 MG tablet Take 0.5 tablets (12.5 mg total) by mouth daily. 12/19/20 03/19/21  Rafael Bihari, FNP  magnesium oxide (MAG-OX) 400 MG tablet Take 1 tablet (400 mg total) by mouth daily. 06/05/18   Deboraha Sprang, MD  metolazone (ZAROXOLYN) 2.5 MG tablet Take 2.5 mg by mouth as needed.    [provider]  mupirocin ointment (BACTROBAN) 2 % Apply 1 application topically daily. Qd to wound on foot 01/09/21   Brendolyn Patty, MD  ondansetron (ZOFRAN) 4 MG tablet  Take 1 tablet (4 mg total) by mouth every 8 (eight) hours as needed for nausea or vomiting. 04/17/20   Elby Beck, FNP  pantoprazole (PROTONIX) 40 MG tablet TAKE 1 TABLET (40 MG TOTAL) BY MOUTH DAILY AT 6 (SIX) AM. 03/31/20 03/31/21  Swayze, Ava, DO  potassium chloride SA (KLOR-CON M20) 20 MEQ tablet Take 2 tablets (40 mEq total) by mouth 2 (two) times daily. Take 1 extra tablet on the days you take metolazone. 06/29/20   Larey Dresser, MD  rosuvastatin (CRESTOR) 40 MG tablet Take 1 tablet (40 mg total) by mouth daily. 11/11/18   Larey Dresser, MD  torsemide (DEMADEX) 100 MG tablet TAKE 1 TABLET BY MOUTH EVERY MORNING (EVENING DOSE 80 MG) 01/05/21   [place], [place], FNP  torsemide (DEMADEX) 20 MG tablet Take 4 tablets (80 mg total) by mouth every evening. To be taken with 100 mg tab in the AM 01/05/21 04/05/21  Rafael Bihari, FNP  vitamin C (ASCORBIC ACID) 250 MG tablet Take 500 mg by mouth daily.    [provider]    Allergies    Dilaudid [hydromorphone], Fentanyl, Codeine, Morphine and related, and Sulfonamide derivatives  Review of Systems   Review of Systems  Constitutional:  Positive for fatigue and fever.  Respiratory:  Positive for cough and shortness of breath.   Cardiovascular:  Negative for chest pain.  Gastrointestinal:  Positive for nausea and vomiting. Negative for abdominal pain and diarrhea.  Skin:  Positive for wound.  Neurological:  Positive for headaches.  All other systems reviewed and are negative.  Physical Exam Updated Vital Signs BP (!) 115/38 (BP Location: Left Arm)   Pulse 74   Temp 98.7 F (37.1 C) (Oral)   Resp 16   SpO2 92%   Physical Exam CONSTITUTIONAL: Elderly, no acute distress HEAD: Normocephalic/atraumatic EYES: EOMI/PERRL ENMT: Mucous membranes moist, no drooling or  stridor no significant erythema noted NECK: supple no meningeal signs SPINE/BACK:entire spine nontender CV: S1/S2 noted,  murmur noted LUNGS: Decreased  breath sounds bilaterally, patient on 2 L nasal cannula ABDOMEN: soft, nontender GU:no cva tenderness NEURO: Pt is awake/alert/appropriate, moves all extremitiesx4.  No facial droop.   EXTREMITIES: full ROM, the feet are warm to touch, distal cap refill less than 2 seconds.  Wounds noted to left foot.  See photos below.  There is erythema noted to the anterior tibial surface.  There is no crepitus to the left lower extremity SKIN: warm, color normal PSYCH: no abnormalities of mood noted, alert and oriented to situation     ED Results / Procedures / Treatments   Labs (all labs ordered are listed, but only abnormal results are displayed) Labs Reviewed  COMPREHENSIVE METABOLIC PANEL - Abnormal; Notable for the following components:      Result Value   Chloride 96 (*)    Glucose, Bld 124 (*)    BUN 39 (*)    Creatinine, Ser 1.63 (*)    GFR, Estimated 31 (*)    All other components within normal limits  CBC WITH DIFFERENTIAL/PLATELET - Abnormal; Notable for the following components:   RBC 3.52 (*)    Hemoglobin 10.2 (*)    HCT 32.3 (*)    RDW 16.4 (*)    Neutro Abs 8.0 (*)    Lymphs Abs 0.3 (*)    All other components within normal limits  RESP PANEL BY RT-PCR (FLU A&B, COVID) [id number]  LACTIC ACID, PLASMA  LACTIC ACID, PLASMA  URINALYSIS, ROUTINE W REFLEX MICROSCOPIC  POC OCCULT BLOOD, ED    EKG EKG Interpretation  Date/Time:  Thursday February 08 2021 05:19:56 EDT Ventricular Rate:  64 PR Interval:    QRS Duration: 145 QT Interval:  453 QTC Calculation: 482 R Axis:   229 Text Interpretation: Atrial fibrillation Nonspecific intraventricular conduction delay Anterolateral infarct, old Confirmed by Ripley Fraise [phone]) on 02/08/2021 5:30:51 AM  Radiology DG Chest Port 1 View  Result Date: 02/08/2021 CLINICAL DATA:  87 year old female with history of fever. EXAM: PORTABLE CHEST 1 VIEW COMPARISON:  Chest x-ray 05/09/2020. FINDINGS: Lung volumes are normal. No  consolidative airspace disease. No pleural effusions. No pneumothorax. Multiple small calcified granulomas are noted throughout the lungs bilaterally. No other definite suspicious appearing pulmonary nodules or masses are noted. No evidence of pulmonary edema. Heart size is mildly enlarged. Upper mediastinal contours are within normal limits. Atherosclerotic calcifications are noted in the thoracic aorta. Left-sided pacemaker/AICD with lead tips projecting over the expected location of the right atrium and right ventricle. IMPRESSION: 1. No radiographic evidence of acute cardiopulmonary disease. 2. Aortic atherosclerosis. 3. Mild cardiomegaly. Electronically Signed   By: Vinnie Langton M.D.   On: 02/08/2021 05:06    Procedures Procedures   Medications Ordered in ED Medications  acetaminophen (TYLENOL) tablet 650 mg (has no administration in time range)  ondansetron (ZOFRAN-ODT) disintegrating tablet 4 mg (4 mg Oral Given 02/08/21 0252)    ED Course  I have reviewed the triage vital signs and the nursing notes.  Pertinent labs & imaging results that were available during my care of the patient were reviewed by me and considered in my medical decision making (see chart for details).    MDM Rules/Calculators/A&P                           4:57 AM Patient presents for multiple complaints.  Patient was found to have squamous cell carcinoma and had this removed with cryotherapy last month, and is scheduled for Mohs therapy later on this fall Patient reports since the procedure last month it has scabbed over on and off.  However overall the appearance appears similar and is not acutely changed.  Patient does report recent fatigue, increased shortness of breath and vomiting.  She reports T-max at home of 100.9.  We will expand the work-up to include chest x-ray/COVID test as well as urinalysis.  Patient is overall awake/alert in no acute distress at this time. Of note patient does have murmur on  exam, but per previous echo has multiple valvular disorders including mitral regurg and tricuspid regurg 5:35 AM Patient did mention recent rectal bleeding and dark stool and is supposed to have GI follow-up later this month.  Patient is on anticoagulation.  Rectal temp was performed by nursing staff, T-max 100.4.  No blood/melena was noted on the temperature probe.  Hemoccult is pending Family at bedside reports patient has had increasing fatigue.  They also report the redness in her lower leg is worse than usual Urinalysis pending 6:17 AM Pt reports she would like to go home if possible No significant change in orthostatic vitals Only source as of this time is possible cellulitis of left lower extremity.  Patient reports she has had a recent DVT study that was negative. Urinalysis is pending. Will give patient IV fluids, check UA. She would like to try to go home on oral antibiotics for cellulitis as she feels improved after fluid Of note, patient was noted to be Hemoccult positive without blood or melena with a stable hemoglobin.  Low likelihood for acute GI bleed, she already has GI follow-up 7:19 AM Signed out to Dr. Billy Fischer at shift change to f/u on urinalysis and ambulate patient  Final Clinical Impression(s) / ED Diagnoses Final diagnoses:  Cellulitis of left lower extremity    Rx / DC Orders ED Discharge Orders          Ordered    cephALEXin (KEFLEX) 500 MG capsule  2 times daily        02/08/21 0719    doxycycline (VIBRAMYCIN) 100 MG capsule  2 times daily        02/08/21 0719             Ripley Fraise, MD 02/08/21 0720

## 2021-02-08 NOTE — ED Notes (Signed)
Pt's family member to the triage desk, states pt is getting sick. Pt brought back to triage for reassessment.

## 2021-02-08 NOTE — H&P (Addendum)
History and Physical    KYNSLI HAAPALA ZOX:[id number] DOB: 1933/06/23 DOA: 02/08/2021  Referring MD/NP/PA: Gareth [name], MD PCP: Lesleigh Noe, MD  Patient coming from: Home  Chief Complaint: Left foot pain and nonhealing wound  I have personally briefly reviewed patient's old medical records in [place]   HPI: [name] is a 87 y.o. female with medical history significant of atrial fibrillation on chronic anticoagulation, diastolic congestive heart failure last EF 65-70% with severe tricuspid regurgitation, NICM s/p ICD, COPD, hypothyroidism, PAD, CKD stage III, RCC s/p nephrectomy, iron deficiency anemia, and ischemic colitis who presented with complaints of left foot pain.  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  She had some procedure done last month, but reports that the wound had never completely healed.  She complains of having sharp pains that are severe and constant in the left foot, but here lately pain had seemed to travel up her leg.  She is being followed by [name] of dermatology and appears to have been referred for Mohs surgery.  Patient reports associated symptoms of subjective generalized malaise, fever up to 100.9 F last night, increased redness coming up the leg, weakness, dizziness, nausea, vomiting, shortness of breath, and intermittent black black stools.  She has not been eating or drinking much due to the pain in her over the last 2 weeks.  She has been taking 1000 mg of Tylenol every 4 hours or so, but reports that she had cut back how frequently she was taking Tylenol.  Patient reports having issues taking pain medications in the past, but her daughter reports that she had previously taken tramadol without having significant issues.  She has been scheduled to follow-up with Dr. Foster [facility] on the 18th due to reports of dark stools. She has continued to take Eliquis as  advised, but last took it yesterday.  ED Course: Upon admission into the emergency department patient was seen to be febrile up to 100.4, respiratory rate 11-25, heart rates 59-1 15, blood pressures as low as 78/49 with improvement after IV fluid bolus with maps currently greater than 65, and O2 saturations currently maintained on room air.  Labs significant for WBC 9.1, hemoglobin 10.2 with elevated RDW of 16.4, BUN 39, creatinine 1.63, Tylenol level undetectable, and lactic acid of 1.5->1.4.   Chest x-ray noted mild cardiomegaly without any acute disease.  Urinalysis was positive for large leukocytes, rare bacteria, and 11-20 WBCs. Influenza and COVID-19 screening was negative.  Stool guaiacs were noted to be positive, but hemoglobin has remained stable and therefore thought to be less likely as acute bleed.  Vascular surgery have been formally consulted due to decreased pulses in the lower extremities.  Blood cultures have been obtained.  Patient had been given 2 L of lactated Ringer's, acetaminophen, vancomycin, and cefepime.\  Review of Systems  Constitutional:  Positive for fever and malaise/fatigue.  Eyes:  Negative for photophobia.  Respiratory:  Positive for shortness of breath.   Cardiovascular:  Positive for claudication and leg swelling. Negative for chest pain.  Gastrointestinal:  Positive for melena, nausea and vomiting. Negative for abdominal pain.  Genitourinary:  Negative for dysuria and frequency.  Musculoskeletal:  Positive for myalgias. Negative for falls.  Skin:  Positive for rash.  Neurological:  Positive for dizziness and weakness. Negative for loss of consciousness.  Psychiatric/Behavioral:  Negative for substance abuse.    Past Medical History:  Diagnosis Date   Actinic keratosis 01/17/2015   R forearm   Adjustment disorder with anxiety    Anemia    Anginal pain ([place])    Arthritis    "some in my hands" (11/11/2012)   Atrial fibrillation (HCC)    Atypical mole  03/25/2018   L forearm - severe   Automatic implantable cardioverter-defibrillator in situ    Blood in stool    Carotid artery stenosis 09/2007   a. 09/2007: 60-79% bilateral (stable); b. 10/2008: 40-59% R 60-79%    Chest pain, unspecified    Chronic airway obstruction, not elsewhere classified    Chronic bronchitis ([place])    "get it some; not q year" (11/11/2012)   Chronic diastolic CHF (congestive heart failure) ([place]) 06/04/2013   Chronic kidney disease, unspecified    Coronary artery disease    non-obstructive by 2006 cath   Displaced fracture of left femoral neck ([place]) 10/09/2018   Frequent UTI    "get them a couple times/yr" (11/11/2012)   GI bleed 03/28/2020   Hemorrhage of rectum and anus    High cholesterol    History of blood transfusion 04/2011   "after hip OR" (11/11/2012)   Hx of renal cell cancer    Hypertension 05/20/2011   Hypertr obst cardiomyop    Hypotension, unspecified    cardiac cath 2006..nonobstructive CAD 30-40s lesions.Marland KitchenETT 1/09 nondiagnostic due to poor HR response..Right Renal Cancer 2003   Long term (current) use of anticoagulants    Malignant neoplasm of kidney, except pelvis    Osteoarthritis of right hip    Other and unspecified coagulation defects    PONV (postoperative nausea and vomiting)    RECTAL BLEEDING 10/13/2009   Qualifier: Diagnosis of  By: Chester Holstein NP, Paula     Renal cancer [facility]) 06/2001   Right   Secondary cardiomyopathy, unspecified    Sinus bradycardia    Squamous cell carcinoma of skin 01/17/2015   R lat wrist   Squamous cell carcinoma of skin 01/29/2018   R post upper leg - superficially invasive   Squamous cell carcinoma of skin 03/25/2018   L lat foot   Squamous cell carcinoma of skin 11/09/2020   left lat foot - [facility] 01/01/21   Urge incontinence     Past Surgical History:  Procedure Laterality Date   ABDOMINAL HYSTERECTOMY  1975   for benign causes   APPENDECTOMY     BI-VENTRICULAR PACEMAKER UPGRADE  05/04/2010   BIOPSY   02/28/2019   Procedure: BIOPSY;  Surgeon: Milus Banister, MD;  Location: [place];  Service: Endoscopy;;   CARDIAC CATHETERIZATION  2006   CARDIOVERSION N/A 02/20/2018   Procedure: CARDIOVERSION;  Surgeon: Minna Merritts, MD;  Location: [place] [facility];  Service: Cardiovascular;  Laterality: N/A;   CARDIOVERSION N/A 03/27/2018   Procedure: CARDIOVERSION;  Surgeon: Minna Merritts, MD;  Location: [facility];  Service: Cardiovascular;  Laterality: N/A;   CATARACT EXTRACTION W/ INTRAOCULAR LENS  IMPLANT, BILATERAL  01/2006-02-2006   CHOLECYSTECTOMY N/A 11/11/2012   Procedure: LAPAROSCOPIC CHOLECYSTECTOMY WITH INTRAOPERATIVE CHOLANGIOGRAM;  Surgeon: Imogene Burn. Georgette Dover, MD;  Location: [place];  Service: General;  Laterality: N/A;   COLONOSCOPY WITH PROPOFOL N/A 02/28/2019   Procedure: COLONOSCOPY WITH PROPOFOL;  Surgeon: Milus Banister, MD;  Location: [facility];  Service: Endoscopy;  Laterality: N/A;   ENTEROSCOPY N/A 03/30/2020   Procedure: ENTEROSCOPY;  Surgeon: Lavena Bullion, DO;  Location: [place];  Service: Gastroenterology;  Laterality: N/A;   EP IMPLANTABLE DEVICE N/A 02/21/2016  Procedure: ICD Generator Changeout;  Surgeon: Deboraha Sprang, MD;  Location: [place] [facility];  Service: Cardiovascular;  Laterality: N/A;   ESOPHAGOGASTRODUODENOSCOPY (EGD) WITH PROPOFOL N/A 02/28/2019   Procedure: ESOPHAGOGASTRODUODENOSCOPY (EGD) WITH PROPOFOL;  Surgeon: Milus Banister, MD;  Location: [facility] ENDOSCOPY;  Service: Endoscopy;  Laterality: N/A;   GIVENS CAPSULE STUDY N/A 03/15/2019   Procedure: GIVENS CAPSULE STUDY;  Surgeon: Thornton Park, MD;  Location: [place];  Service: Gastroenterology;  Laterality: N/A;   HOT HEMOSTASIS N/A 03/30/2020   Procedure: HOT HEMOSTASIS (ARGON PLASMA COAGULATION/BICAP);  Surgeon: Lavena Bullion, DO;  Location: [facility] ENDOSCOPY;  Service: Gastroenterology;  Laterality: N/A;   INSERT / REPLACE / REMOVE PACEMAKER  05-01-11    02-28-05-/05-04-10-ICD-MEDTRONIC MAXIMAL DR   JOINT REPLACEMENT     LAPAROSCOPIC CHOLECYSTECTOMY  11/11/2012   LAPAROSCOPIC LYSIS OF ADHESIONS N/A 11/11/2012   Procedure: LAPAROSCOPIC LYSIS OF ADHESIONS;  Surgeon: Imogene Burn. Georgette Dover, MD;  Location: [place];  Service: General;  Laterality: N/A;   NEPHRECTOMY Right 06/2001    S/P RENAL CELL CANCER   PRESSURE SENSOR/CARDIOMEMS N/A 02/03/2019   Procedure: PRESSURE SENSOR/CARDIOMEMS;  Surgeon: Larey Dresser, MD;  Location: [place] [facility];  Service: Cardiovascular;  Laterality: N/A;   RIGHT HEART CATH N/A 11/09/2018   Procedure: RIGHT HEART CATH;  Surgeon: Larey Dresser, MD;  Location: [place] [facility];  Service: Cardiovascular;  Laterality: N/A;   RIGHT HEART CATH N/A 03/08/2019   Procedure: RIGHT HEART CATH;  Surgeon: Larey Dresser, MD;  Location: [place] [facility];  Service: Cardiovascular;  Laterality: N/A;   SUBMUCOSAL TATTOO INJECTION  02/28/2019   Procedure: SUBMUCOSAL TATTOO INJECTION;  Surgeon: Milus Banister, MD;  Location: [facility] ENDOSCOPY;  Service: Endoscopy;;   TOTAL HIP ARTHROPLASTY Right 05/03/2011   Procedure: TOTAL HIP ARTHROPLASTY ANTERIOR APPROACH;  Surgeon: Mcarthur Rossetti;  Location: [facility];  Service: Orthopedics;  Laterality: Right;  Removal of Cannulated Screws Right Hip, Right Direct Anterior Hip Replacement   TOTAL HIP ARTHROPLASTY Left 10/09/2018   Procedure: TOTAL HIP ARTHROPLASTY ANTERIOR APPROACH;  Surgeon: Rod Can, MD;  Location: [place];  Service: Orthopedics;  Laterality: Left;     reports that she quit smoking about 19 years ago. Her smoking use included cigarettes. She has a 20.00 pack-year smoking history. She has never used smokeless tobacco. She reports that she does not drink alcohol and does not use drugs.  Allergies  Allergen Reactions   Dilaudid [Hydromorphone] Other (See Comments)    Excessive Somnolence- Required Narcan   Fentanyl Other (See Comments)    Required Narcan when given with  Dilaudid. Tolerated single dose when given during procedure 02/03/2019.    Codeine Nausea And Vomiting    Hallucinations    Morphine And Related Nausea And Vomiting    Hallucinations    Sulfonamide Derivatives Other (See Comments)    Dry mouth    Family History  Problem Relation Age of Onset   Heart failure Mother    Early death Father        car accident   Breast cancer Maternal Aunt 82   Breast cancer Cousin    Breast cancer Other    Colon cancer Neg Hx     Prior to Admission medications   Medication Sig Start Date End Date Taking? Authorizing Provider  cephALEXin (KEFLEX) 500 MG capsule Take 1 capsule (500 mg total) by mouth 2 (two) times daily. 02/08/21  Yes Ripley Fraise, MD  doxycycline (VIBRAMYCIN) 100 MG capsule  Take 1 capsule (100 mg total) by mouth 2 (two) times daily. One po bid x 7 days 02/08/21  Yes Ripley Fraise, MD  acetaminophen (TYLENOL) 500 MG tablet Take 500-1,000 mg by mouth every 8 (eight) hours as needed for mild pain or headache.     [provider]  apixaban (ELIQUIS) 2.5 MG TABS tablet Take 1 tablet (2.5 mg total) by mouth 2 (two) times daily. Dispense starter pack 05/17/20   Larey Dresser, MD  cetirizine (ZYRTEC) 10 MG tablet Take 10 mg by mouth daily. As needed    [provider]  Cholecalciferol (VITAMIN D) 50 MCG (2000 UT) CAPS Take 4,000 Units by mouth daily.    [provider]  colchicine 0.6 MG tablet Take 2 pills followed by 1 pill 1 hour later. Then if no improvement in 2 days take 1 pill daily for up to 3 days. 01/30/21   Lesleigh Noe, MD  Cranberry (AZO CRANBERRY GUMMIES) 500 MG CHEW Chew 2,000 mg by mouth daily.    [provider]  Cyanocobalamin (B-12 PO) Take 1 tablet by mouth daily.     [provider]  ezetimibe (ZETIA) 10 MG tablet Take 1 tablet (10 mg total) by mouth daily. 06/21/20   Milford, Maricela Bo, FNP  ferrous sulfate 325 (65 FE) MG tablet Take 1 tablet (325 mg total) by mouth 2 (two)  times daily with a meal. 03/17/19   Shelly Coss, MD  fluticasone (FLONASE) 50 MCG/ACT nasal spray Place 2 sprays into both nostrils daily as needed for allergies or rhinitis. 07/27/19   Elby Beck, FNP  levothyroxine (SYNTHROID) 25 MCG tablet TAKE 1 TABLET BY MOUTH ONCE DAILY BEFOREBREAKFAST 04/19/19   Deboraha Sprang, MD  losartan (COZAAR) 25 MG tablet Take 0.5 tablets (12.5 mg total) by mouth daily. 12/19/20 03/19/21  Rafael Bihari, FNP  magnesium oxide (MAG-OX) 400 MG tablet Take 1 tablet (400 mg total) by mouth daily. 06/05/18   Deboraha Sprang, MD  metolazone (ZAROXOLYN) 2.5 MG tablet Take 2.5 mg by mouth as needed.    [provider]  mupirocin ointment (BACTROBAN) 2 % Apply 1 application topically daily. Qd to wound on foot 01/09/21   [name] Patty, MD  ondansetron (ZOFRAN) 4 MG tablet Take 1 tablet (4 mg total) by mouth every 8 (eight) hours as needed for nausea or vomiting. 04/17/20   Elby Beck, FNP  pantoprazole (PROTONIX) 40 MG tablet TAKE 1 TABLET (40 MG TOTAL) BY MOUTH DAILY AT 6 (SIX) AM. 03/31/20 03/31/21  Swayze, Ava, DO  potassium chloride SA (KLOR-CON M20) 20 MEQ tablet Take 2 tablets (40 mEq total) by mouth 2 (two) times daily. Take 1 extra tablet on the days you take metolazone. 06/29/20   Larey Dresser, MD  rosuvastatin (CRESTOR) 40 MG tablet Take 1 tablet (40 mg total) by mouth daily. 11/11/18   Larey Dresser, MD  torsemide (DEMADEX) 100 MG tablet TAKE 1 TABLET BY MOUTH EVERY MORNING (EVENING DOSE 80 MG) 01/05/21   Milford, [place], FNP  torsemide (DEMADEX) 20 MG tablet Take 4 tablets (80 mg total) by mouth every evening. To be taken with 100 mg tab in the AM 01/05/21 04/05/21  Rafael Bihari, FNP  vitamin C (ASCORBIC ACID) 250 MG tablet Take 500 mg by mouth daily.    [provider]    Physical Exam:  Constitutional: Elderly female who appears to be chronically ill and in no acute Vitals:   02/08/21  1030 02/08/21 1045 02/08/21 1115  02/08/21 1200  BP: (!) 111/44 (!) 109/51 (!) 135/47 124/65  Pulse: 71 74 64 73  Resp: 18 (!) 24 (!) 24 17  Temp:      TempSrc:      SpO2: 96% 97% 99% 100%  Weight:      Height:       Eyes: PERRL, lids and conjunctivae normal ENMT: Mucous membranes are dry. Posterior pharynx clear of any exudate or lesions.   Neck: normal, supple, no masses, no thyromegaly Respiratory: clear to auscultation bilaterally, no wheezing, no crackles. Normal respiratory effort. No accessory muscle use.  Cardiovascular: Irregularly irregular, positive systolic murmur appreciated.  I unable to appreciate pedal pulses. Abdomen: no tenderness, no masses palpated. No hepatosplenomegaly. Bowel sounds positive.  Musculoskeletal: no clubbing / cyanosis.   Skin: Chronic venous stasis changes noted of the lower extremity.  Nonhealing wound noted of the lateral aspect of the left foot with erythema tracking up to the mid tibia area with increased warmth.  Skin is very tender to the touch Neurologic: CN 2-12 grossly intact. Sensation intact,  Strength 5/5 in all 4.  Psychiatric: Normal judgment and insight. Alert and oriented x 3. Normal mood.     Labs on Admission: I have personally reviewed following labs and imaging studies  CBC: Recent Labs  Lab 02/08/21 0156  WBC 9.1  NEUTROABS 8.0*  HGB 10.2*  HCT 32.3*  MCV 91.8  PLT 960   Basic Metabolic Panel: Recent Labs  Lab 02/08/21 0156  NA 135  K 3.9  CL 96*  CO2 27  GLUCOSE 124*  BUN 39*  CREATININE 1.63*  CALCIUM 9.5   GFR: Estimated Creatinine Clearance: 24.5 mL/min (A) (by C-G formula based on SCr of 1.63 mg/dL (H)). Liver Function Tests: Recent Labs  Lab 02/08/21 0156  AST 18  ALT 15  ALKPHOS 46  BILITOT 0.9  PROT 6.6  ALBUMIN 4.0   No results for input(s): LIPASE, AMYLASE in the last 168 hours. No results for input(s): AMMONIA in the last 168 hours. Coagulation Profile: No results for input(s): INR, PROTIME in the last 168  hours. Cardiac Enzymes: No results for input(s): CKTOTAL, CKMB, CKMBINDEX, TROPONINI in the last 168 hours. BNP (last 3 results) No results for input(s): PROBNP in the last 8760 hours. HbA1C: No results for input(s): HGBA1C in the last 72 hours. CBG: No results for input(s): GLUCAP in the last 168 hours. Lipid Profile: No results for input(s): CHOL, HDL, LDLCALC, TRIG, CHOLHDL, LDLDIRECT in the last 72 hours. Thyroid Function Tests: No results for input(s): TSH, T4TOTAL, FREET4, T3FREE, THYROIDAB in the last 72 hours. Anemia Panel: No results for input(s): VITAMINB12, FOLATE, FERRITIN, TIBC, IRON, RETICCTPCT in the last 72 hours. Urine analysis:    Component Value Date/Time   COLORURINE YELLOW 02/08/2021 0942   APPEARANCEUR CLEAR 02/08/2021 0942   APPEARANCEUR Cloudy (A) 01/27/2018 1330   LABSPEC 1.018 02/08/2021 0942   PHURINE 5.0 02/08/2021 0942   GLUCOSEU NEGATIVE 02/08/2021 0942   HGBUR NEGATIVE 02/08/2021 0942   HGBUR large 11/16/2009 1202   BILIRUBINUR NEGATIVE 02/08/2021 0942   BILIRUBINUR neg 05/02/2020 0911   BILIRUBINUR Negative 01/27/2018 1330   KETONESUR NEGATIVE 02/08/2021 0942   PROTEINUR NEGATIVE 02/08/2021 0942   UROBILINOGEN 0.2 05/02/2020 0911   UROBILINOGEN 1.0 08/16/2014 1754   NITRITE NEGATIVE 02/08/2021 0942   LEUKOCYTESUR LARGE (A) 02/08/2021 0942   Sepsis Labs: Recent Results (from the past 240 hour(s))  Resp Panel by RT-PCR (Flu  A&B, Covid) Nasopharyngeal Swab     Status: None   Collection Time: 02/08/21  4:46 AM   Specimen: Nasopharyngeal Swab; Nasopharyngeal(NP) swabs in vial transport medium  Result Value Ref Range Status   SARS Coronavirus 2 by RT PCR NEGATIVE NEGATIVE Final    Comment: (NOTE) SARS-CoV-2 target nucleic acids are NOT DETECTED.  The SARS-CoV-2 RNA is generally detectable in upper respiratory specimens during the acute phase of infection. The lowest concentration of SARS-CoV-2 viral copies this assay can detect is 138  copies/mL. A negative result does not preclude SARS-Cov-2 infection and should not be used as the sole basis for treatment or other patient management decisions. A negative result may occur with  improper specimen collection/handling, submission of specimen other than nasopharyngeal swab, presence of viral mutation(s) within the areas targeted by this assay, and inadequate number of viral copies(<138 copies/mL). A negative result must be combined with clinical observations, patient history, and epidemiological information. The expected result is Negative.  Fact Sheet for Patients:  EntrepreneurPulse.com.au  Fact Sheet for Healthcare Providers:  IncredibleEmployment.be  This test is no t yet approved or cleared by the [place] FDA and  has been authorized for detection and/or diagnosis of SARS-CoV-2 by FDA under an Emergency Use Authorization (EUA). This EUA will remain  in effect (meaning this test can be used) for the duration of the COVID-19 declaration under Section 564(b)(1) of the Act, 21 U.S.C.section 360bbb-3(b)(1), unless the authorization is terminated  or revoked sooner.       Influenza A by PCR NEGATIVE NEGATIVE Final   Influenza B by PCR NEGATIVE NEGATIVE Final    Comment: (NOTE) The Xpert Xpress SARS-CoV-2/FLU/RSV plus assay is intended as an aid in the diagnosis of influenza from Nasopharyngeal swab specimens and should not be used as a sole basis for treatment. Nasal washings and aspirates are unacceptable for Xpert Xpress SARS-CoV-2/FLU/RSV testing.  Fact Sheet for Patients: EntrepreneurPulse.com.au  Fact Sheet for Healthcare Providers: IncredibleEmployment.be  This test is not yet approved or cleared by the [place] FDA and has been authorized for detection and/or diagnosis of SARS-CoV-2 by FDA under an Emergency Use Authorization (EUA). This EUA will remain in effect (meaning  this test can be used) for the duration of the COVID-19 declaration under Section 564(b)(1) of the Act, 21 U.S.C. section 360bbb-3(b)(1), unless the authorization is terminated or revoked.  Performed at [facility], [place] [address] [place], [place] [address]      Radiological Exams on Admission: DG Ankle Complete Left  Result Date: 02/08/2021 CLINICAL DATA:  Left lateral foot wound, left ankle pain. EXAM: LEFT ANKLE COMPLETE - 3+ VIEW COMPARISON:  Foot radiograph 11/10/2019 FINDINGS: Mild likely chronic periosteal reaction along the distal tibia medially, probably from chronic venous insufficiency. No fracture or acute bony findings. No bony destructive findings characteristic of osteomyelitis, I do note that the posterior portion of the calcaneus was excluded on the lateral projection and if this is a specific area of concern thin please notify the radiology department and we can attempt another lateral view. There is soft tissue swelling and subcutaneous edema along the lower calf and ankle. IMPRESSION: 1. Diffuse subcutaneous edema. 2. No bony destructive findings characteristic of osteomyelitis in the ankle region. Please note the posterior calcaneus is excluded on the lateral projection. 3. Likely chronic periosteal reaction along the distal tibia, most likely from chronic venous insufficiency. Electronically Signed   By: Van Clines M.D.   On: 02/08/2021 12:10   DG  Chest Port 1 View  Result Date: 02/08/2021 CLINICAL DATA:  87 year old female with history of fever. EXAM: PORTABLE CHEST 1 VIEW COMPARISON:  Chest x-ray 05/09/2020. FINDINGS: Lung volumes are normal. No consolidative airspace disease. No pleural effusions. No pneumothorax. Multiple small calcified granulomas are noted throughout the lungs bilaterally. No other definite suspicious appearing pulmonary nodules or masses are noted. No evidence of pulmonary edema. Heart size is mildly enlarged. Upper mediastinal contours  are within normal limits. Atherosclerotic calcifications are noted in the thoracic aorta. Left-sided pacemaker/AICD with lead tips projecting over the expected location of the right atrium and right ventricle. IMPRESSION: 1. No radiographic evidence of acute cardiopulmonary disease. 2. Aortic atherosclerosis. 3. Mild cardiomegaly. Electronically Signed   By: Vinnie Langton M.D.   On: 02/08/2021 05:06    EKG: Independently reviewed.  Atrial fibrillation at 64 bpm.  Assessment/Plan Sepsis due to cellulitis Recurrent squamous cell carcinoma: Acute.  Patient presents with complaints of a left foot wound from recurrent squamous cell carcinoma with redness and erythema.  Was noted to be febrile up to 100.4 F with tachycardia and tachypnea.  Lactic acids were noted to be reassuring.  Patient had been started empirically on vancomycin and cefepime.  There was initially concern for the possibility of underlying osteomyelitis, but x-rays of the left leg did not show any significant concern for bone involvement.  She is followed by dermatology and had been referred for Mohs. -Admit to a medical telemetry bed -Cellulitis order set utilized -Check ESR and CRP -Continue empiric antibiotics of vancomycin and cefepime -Tramadol as needed for pain -Try lidocaine patch  -Recommend outpatient follow-up with dermatology  Hypotension: Acute.  On admission blood pressures noted to be initially as low as 72/51.  Patient blood pressures improved after initial IV fluid bolus, but worsening to trend down. -Bolus additional 500 mL of normal saline IV fluids and then placed on a rate of 75 mL/h for 1 L to maintain goal MAP of 65 -Hold home blood pressure medication at this time  Peripheral vascular disease: Patient has known atherosclerotic disease and was noted to have decreased right lower extremity pulses.  Dr. Luan Pulling of vascular surgery evaluated and possible arteriogram on 10/10. -Check hemoglobin A1c and lipid  panel -Appreciate vascular surgery consultative services, will follow-up for further recommendations  Respiratory failure with hypoxia COPD: Patient has a history of COPD.  Chest x-ray noted cardiomegaly without frank edema.  Significant wheezing appreciated on physical exam at this time, but patient was put on 3 L nasal cannula oxygen. -Continuous pulse oximetry with nasal cannula oxygen maintain O2 saturation greater than 90%. -Albuterol nebs as needed for shortness of breath/wheezing  Chronic kidney disease 3B: Patient presents with creatinine elevated up to 1.63 with BUN 39.  Not greater than 0.3 increased from baseline which appears to range around 1.4-1.5. -Continue to monitor kidney function  Abnormal urinalysis: Patient denies having any complaints of dysuria or urinary frequency.  Urinalysis was positive for large leukocytes with rare bacteria, and 11-20 WBCs. -Add on urine culture -Continue empiric antibiotics as seen above  Persistent atrial fibrillation on chronic anticoagulation: Patient is chronically in atrial fibrillation, but appears to be rate controlled.  She is on Eliquis and last took medication yesterday. CHA2DS2-VASc score = 6. -Hold Eliquis for possible need to proceed -Consider bridging with heparin if blood counts remained stable with reports of melena  Diastolic congestive heart failureNICM s/p PM: Patient does not appear grossly fluid overloaded at this time.  Last EF  noted to be 65-70% in 03/2020. -Strict intake and output -Daily weights -Orders placed to have pacemaker interrogated while in the ED -Held torsemide initially due to hypotension.  Reassess in a.m. and adjust the medication as medically appropriate  Melena: Patient reports that she has had an intermittent dark stools.  Hemoglobin appears relatively similar to previous 10.2 g/dL.  Stool guaiacs were noted to be positive.  Patient has follow-up appointment with Dr. Bryan Lemma on the 18. -Serial  monitoring of H&H -Would need to rediscuss possibility of blood products if hemoglobin continues to drop -Considering need to formally consult GI if patient noted to have significant drop in hemoglobin.  Dyslipidemia -Continue Crestor and Zetia  Hypothyroidism: TSH was 2.2 -Continue levothyroxine  History of renal cell cancer s/p nephrectomy in 2003  GERD -Continue Protonix  DVT prophylaxis: Hold Eliquis Code Status: Full Family Communication: Granddaughter updated at bedside Disposition Plan: Hopefully discharge home once medically Consults called: Vascular Admission status: Inpatient, require more than 2 midnight stay  Norval Morton MD [facility]   If 7PM-7AM, please contact night-coverage   02/08/2021, 12:45 PM

## 2021-02-08 NOTE — ED Provider Notes (Signed)
  Physical Exam  BP (!) 115/48   Pulse 72   Temp 98 F (36.7 C) (Oral)   Resp (!) 22   Ht 5' 6.5" (1.689 m)   Wt 66.7 kg   SpO2 100%   BMI 23.37 kg/m   Physical Exam  ED Course/Procedures     Procedures  MDM   Received care of patient from Dr. Christy Gentles.  Please see his note for prior history, physical and care.  Briefly this is an 87 year old female who presents with concern for fatigue, fever, generalized weakness, nonhealing wound to her left lower extremity with surrounding pain and redness.  On my exam, she has severe pain to the left lower extremity.  I am unable to palpate pulses bilaterally, however was able to obtain a Doppler signal to the PT of the left lower extremity.  Right lower extremity was limited, however she is not having significant pain on right and I doubt acute arterial occlusion.  Discussed vascular findings and LLE pain with Dr. Stanford Breed of vascular surgery.  I have concerns that she has significant peripheral vascular disease which is likely affecting her wound healing, and he will come evaluate her.  ABIs have been ordered.  While she does not have tachycardia, her white blood cell count, her diastolic blood pressures are low, she appears very fatigued and generally weak, given fever with potential source of urinary tract infection, wound infection, rule out osteomyelitis, feel she requires admission to the hospital for IV antibiotics and further care.  She is given additional fluid with improvement of her MAPs.  XR of LE ordered. Will be admitted for further care.       Gareth [name], MD 02/08/21 1231

## 2021-02-08 NOTE — Progress Notes (Signed)
Pharmacy Antibiotic Note  [name] is a 87 y.o. female admitted on 02/08/2021 with  osteomyelitis .  Pharmacy has been consulted for vancomycin and cefepime dosing.  Plan: Vancomycin  1250mg  IV q48h (eAUC 534, Cr 1.63mg /dL, Vd 0.72 L/kg) Cefepime 2g IV q24h -Monitor renal function, clinical status, and antibiotic plan -Order vanc levels as necessary  Height: 5' 6.5" (168.9 cm) Weight: 66.7 kg (147 lb) IBW/kg (Calculated) : 60.45  Temp (24hrs), Avg:98.9 F (37.2 C), Min:98 F (36.7 C), Max:100.4 F (38 C)  Recent Labs  Lab 02/08/21 0155 02/08/21 0156 02/08/21 0352  WBC  --  9.1  --   CREATININE  --  1.63*  --   LATICACIDVEN 1.5  --  1.4    Estimated Creatinine Clearance: 24.5 mL/min (A) (by C-G formula based on SCr of 1.63 mg/dL (H)).    Allergies  Allergen Reactions   Dilaudid [Hydromorphone] Other (See Comments)    Excessive Somnolence- Required Narcan   Fentanyl Other (See Comments)    Required Narcan when given with Dilaudid. Tolerated single dose when given during procedure 02/03/2019.    Codeine Nausea And Vomiting    Hallucinations    Morphine And Related Nausea And Vomiting    Hallucinations    Sulfonamide Derivatives Other (See Comments)    Dry mouth   Antimicrobials this admission: Vanc 10/6 >>  Cefepime 10/6 >>   Dose adjustments this admission: N/A  Microbiology results: 10/6 BCx:  10/6 UCx:    Thank you for allowing pharmacy to be a part of this patient's care.  Joetta Manners, PharmD, [facility] Emergency Medicine Clinical Pharmacist ED RPh Phone: [place]: [phone]

## 2021-02-08 NOTE — ED Notes (Signed)
Orthostatic vitals at 548 and 550 , negative result

## 2021-02-08 NOTE — ED Notes (Signed)
Grandaughter clarified with pt that she will take blood transfusions as needed. But she has had a reaction requiring Benadryl in the past.

## 2021-02-08 NOTE — ED Notes (Signed)
Placed on 2LNC, as sats 90%

## 2021-02-08 NOTE — ED Notes (Signed)
Helped patient up to the bedside toilet patient did well patient is now back in bed on the monitor with call bell in reach

## 2021-02-08 NOTE — Consult Note (Addendum)
[facility] [place]  ASSESSMENT / PLAN: [name] is a 87 y.o. female with atherosclerosis of native arteries of bilateral lower extremities causing  poorly healing biopsy site of left foot  with mild cellulitis.   Recommend the following which can slow the progression of atherosclerosis and reduce the risk of major adverse cardiac / limb events:  Complete cessation from all tobacco products. Blood glucose control with goal A1c < 7%. Blood pressure control with goal blood pressure < 140/90 mmHg. Lipid reduction therapy with goal LDL-C <100 mg/dL (<70 if symptomatic from PAD).  Aspirin 81mg  PO QD.  Atorvastatin 40-80mg  PO QD (or other "high intensity" statin therapy).  She was  maintained on Eliquis. Will need to hold this 48 hours prior to angiogram. Plan left lower extremity angiogram with possible intervention 02/12/21.   CHIEF COMPLAINT: malaise  HISTORY OF PRESENT ILLNESS: [name] is a 87 y.o. female admitted to the internal medicine service with fatigue, malaise, fever, and a nonhealing wound to her left lower extremity.  The patient reports a long history of intermittent claudication.  She is a bit confused on exam and reports to me that she has had a wound on her left lateral ankle since August.  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  She has seen my partner in the past, and has a known history of severe peripheral arterial disease.  The patient reports severe, unremitting pain in the left foot over the past several days.  She does have some mild redness about the ankle concerning for cellulitis.  VASCULAR SURGICAL HISTORY: none  VASCULAR RISK FACTORS: Positive history of stroke / transient ischemic attack. Negative history of coronary artery disease.  Negative history of diabetes mellitus. Last A1c 6.1 (2016). Positive history of smoking. Not actively  smoking. Positive history of hypertension.  Positive history of chronic kidney disease.  Last GFR 31. CKD stage 3 - GFR 30-59. Positive history of chronic obstructive pulmonary disease. Not on home O2.  FUNCTIONAL STATUS: ECOG performance status: (1) Restricted in physically strenuous activity, ambulatory and able to do work of light nature Ambulatory status: Ambulatory within the community with limits  Past Medical History:  Diagnosis Date   Actinic keratosis 01/17/2015   R forearm   Adjustment disorder with anxiety    Anemia    Anginal pain ([place])    Arthritis    "some in my hands" (11/11/2012)   Atrial fibrillation (HCC)    Atypical mole 03/25/2018   L forearm - severe   Automatic implantable cardioverter-defibrillator in situ    Blood in stool    Carotid artery stenosis 09/2007   a. 09/2007: 60-79% bilateral (stable); b. 10/2008: 40-59% R 60-79%    Chest pain, unspecified    Chronic airway obstruction, not elsewhere classified    Chronic bronchitis ([place])    "get it some; not q year" (11/11/2012)   Chronic diastolic CHF (congestive heart failure) ([place]) 06/04/2013   Chronic kidney disease, unspecified    Coronary artery disease    non-obstructive by 2006 cath   Displaced fracture of left femoral neck ([place]) 10/09/2018   Frequent UTI    "get them a couple times/yr" (11/11/2012)   GI bleed 03/28/2020   Hemorrhage of rectum and anus    High cholesterol    History of blood transfusion 04/2011   "after hip OR" (11/11/2012)   Hx of renal cell cancer  Hypertension 05/20/2011   Hypertr obst cardiomyop    Hypotension, unspecified    cardiac cath 2006..nonobstructive CAD 30-40s lesions.Marland KitchenETT 1/09 nondiagnostic due to poor HR response..Right Renal Cancer 2003   Long term (current) use of anticoagulants    Malignant neoplasm of kidney, except pelvis    Osteoarthritis of right hip    Other and unspecified coagulation defects    PONV (postoperative nausea and vomiting)    RECTAL BLEEDING  10/13/2009   Qualifier: Diagnosis of  By: Chester Holstein NP, Paula     Renal cancer [facility]) 06/2001   Right   Secondary cardiomyopathy, unspecified    Sinus bradycardia    Squamous cell carcinoma of skin 01/17/2015   R lat wrist   Squamous cell carcinoma of skin 01/29/2018   R post upper leg - superficially invasive   Squamous cell carcinoma of skin 03/25/2018   L lat foot   Squamous cell carcinoma of skin 11/09/2020   left lat foot - [facility] 01/01/21   Urge incontinence     Past Surgical History:  Procedure Laterality Date   ABDOMINAL HYSTERECTOMY  1975   for benign causes   APPENDECTOMY     BI-VENTRICULAR PACEMAKER UPGRADE  05/04/2010   BIOPSY  02/28/2019   Procedure: BIOPSY;  Surgeon: Milus Banister, MD;  Location: [place];  Service: Endoscopy;;   CARDIAC CATHETERIZATION  2006   CARDIOVERSION N/A 02/20/2018   Procedure: CARDIOVERSION;  Surgeon: Minna Merritts, MD;  Location: [place] [facility];  Service: Cardiovascular;  Laterality: N/A;   CARDIOVERSION N/A 03/27/2018   Procedure: CARDIOVERSION;  Surgeon: Minna Merritts, MD;  Location: [facility];  Service: Cardiovascular;  Laterality: N/A;   CATARACT EXTRACTION W/ INTRAOCULAR LENS  IMPLANT, BILATERAL  01/2006-02-2006   CHOLECYSTECTOMY N/A 11/11/2012   Procedure: LAPAROSCOPIC CHOLECYSTECTOMY WITH INTRAOPERATIVE CHOLANGIOGRAM;  Surgeon: Imogene Burn. Georgette Dover, MD;  Location: [place];  Service: General;  Laterality: N/A;   COLONOSCOPY WITH PROPOFOL N/A 02/28/2019   Procedure: COLONOSCOPY WITH PROPOFOL;  Surgeon: Milus Banister, MD;  Location: [facility] ENDOSCOPY;  Service: Endoscopy;  Laterality: N/A;   ENTEROSCOPY N/A 03/30/2020   Procedure: ENTEROSCOPY;  Surgeon: Lavena Bullion, DO;  Location: [place];  Service: Gastroenterology;  Laterality: N/A;   EP IMPLANTABLE DEVICE N/A 02/21/2016   Procedure: ICD Generator Changeout;  Surgeon: Deboraha Sprang, MD;  Location: [place] CV LAB;  Service: Cardiovascular;  Laterality: N/A;    ESOPHAGOGASTRODUODENOSCOPY (EGD) WITH PROPOFOL N/A 02/28/2019   Procedure: ESOPHAGOGASTRODUODENOSCOPY (EGD) WITH PROPOFOL;  Surgeon: Milus Banister, MD;  Location: [facility] ENDOSCOPY;  Service: Endoscopy;  Laterality: N/A;   GIVENS CAPSULE STUDY N/A 03/15/2019   Procedure: GIVENS CAPSULE STUDY;  Surgeon: [place] Park, MD;  Location: [place];  Service: Gastroenterology;  Laterality: N/A;   HOT HEMOSTASIS N/A 03/30/2020   Procedure: HOT HEMOSTASIS (ARGON PLASMA COAGULATION/BICAP);  Surgeon: Lavena Bullion, DO;  Location: [facility] ENDOSCOPY;  Service: Gastroenterology;  Laterality: N/A;   INSERT / REPLACE / REMOVE PACEMAKER  05-01-11   02-28-05-/05-04-10-ICD-MEDTRONIC MAXIMAL DR   JOINT REPLACEMENT     LAPAROSCOPIC CHOLECYSTECTOMY  11/11/2012   LAPAROSCOPIC LYSIS OF ADHESIONS N/A 11/11/2012   Procedure: LAPAROSCOPIC LYSIS OF ADHESIONS;  Surgeon: Imogene Burn. Georgette Dover, MD;  Location: [place];  Service: General;  Laterality: N/A;   NEPHRECTOMY Right 06/2001    S/P RENAL CELL CANCER   PRESSURE SENSOR/CARDIOMEMS N/A 02/03/2019   Procedure: PRESSURE SENSOR/CARDIOMEMS;  Surgeon: Larey Dresser, MD;  Location: [place] CV LAB;  Service: Cardiovascular;  Laterality: N/A;  RIGHT HEART CATH N/A 11/09/2018   Procedure: RIGHT HEART CATH;  Surgeon: Larey Dresser, MD;  Location: [place] CV LAB;  Service: Cardiovascular;  Laterality: N/A;   RIGHT HEART CATH N/A 03/08/2019   Procedure: RIGHT HEART CATH;  Surgeon: Larey Dresser, MD;  Location: [place] CV LAB;  Service: Cardiovascular;  Laterality: N/A;   SUBMUCOSAL TATTOO INJECTION  02/28/2019   Procedure: SUBMUCOSAL TATTOO INJECTION;  Surgeon: Milus Banister, MD;  Location: [facility] ENDOSCOPY;  Service: Endoscopy;;   TOTAL HIP ARTHROPLASTY Right 05/03/2011   Procedure: TOTAL HIP ARTHROPLASTY ANTERIOR APPROACH;  Surgeon: Mcarthur Rossetti;  Location: [facility];  Service: Orthopedics;  Laterality: Right;  Removal of Cannulated Screws Right Hip, Right Direct  Anterior Hip Replacement   TOTAL HIP ARTHROPLASTY Left 10/09/2018   Procedure: TOTAL HIP ARTHROPLASTY ANTERIOR APPROACH;  Surgeon: Rod Can, MD;  Location: [place];  Service: Orthopedics;  Laterality: Left;    Family History  Problem Relation Age of Onset   Heart failure Mother    Early death Father        car accident   Breast cancer Maternal Aunt 82   Breast cancer Cousin    Breast cancer Other    Colon cancer Neg Hx     Social History   Socioeconomic History   Marital status: Widowed    Spouse name: Not on file   Number of children: 2   Years of education: high school   Highest education level: Not on file  Occupational History   Occupation: Retired    Fish farm manager: RETIRED  Tobacco Use   Smoking status: Former    Packs/day: 0.50    Years: 40.00    Pack years: 20.00    Types: Cigarettes    Quit date: 05/06/2001    Years since quitting: 19.7   Smokeless tobacco: Never  Vaping Use   Vaping Use: Never used  Substance and Sexual Activity   Alcohol use: No    Alcohol/week: 0.0 standard drinks   Drug use: No   Sexual activity: Not Currently  Other Topics Concern   Not on file  Social History Narrative   Would desire CPR   07/11/20   From: the area   Living: with great-grandson - [name] (2003)   Work: retired - clerical      Family: 2 children - [name] and [place] - 2 grand chlidren - 5 great grand children      Enjoys: spending time with friends - watch move, eat out, spend time      Exercise: walking around the house   Diet: not good, limits red meat, limits salt, low appetite      Safety   Seat belts: Yes    Guns: Yes  and secure   Safe in relationships: Yes    Social Determinants of Health   Financial Resource Strain: Not on file  Food Insecurity: Not on file  Transportation Needs: Not on file  Physical Activity: Not on file  Stress: Not on file  Social Connections: Not on file  Intimate Partner Violence: Not on file    Allergies  Allergen  Reactions   Dilaudid [Hydromorphone] Other (See Comments)    Excessive Somnolence- Required Narcan   Fentanyl Other (See Comments)    Required Narcan when given with Dilaudid. Tolerated single dose when given during procedure 02/03/2019.    Codeine Nausea And Vomiting    Hallucinations    Morphine And Related Nausea And Vomiting    Hallucinations  Sulfonamide Derivatives Other (See Comments)    Dry mouth    Current Facility-Administered Medications  Medication Dose Route Frequency Provider Last Rate Last Admin   acetaminophen (TYLENOL) tablet 650 mg  650 mg Oral Q6H PRN Norval Morton, MD       Or   acetaminophen (TYLENOL) suppository 650 mg  650 mg Rectal Q6H PRN Fuller Plan A, MD       albuterol (PROVENTIL) (2.5 MG/3ML) 0.083% nebulizer solution 2.5 mg  2.5 mg Nebulization Q6H PRN Smith, Rondell A, MD       ceFEPIme (MAXIPIME) 2 g in sodium chloride 0.9 % 100 mL IVPB  2 g Intravenous Q24H Gareth [name], MD   Stopped at 02/08/21 1246   ondansetron (ZOFRAN) tablet 4 mg  4 mg Oral Q6H PRN Norval Morton, MD       Or   ondansetron (ZOFRAN) injection 4 mg  4 mg Intravenous Q6H PRN Smith, Rondell A, MD       sodium chloride flush (NS) 0.9 % injection 3 mL  3 mL Intravenous Q12H Smith, Rondell A, MD       vancomycin (VANCOREADY) IVPB 1250 mg/250 mL  1,250 mg Intravenous Once Gareth [name], MD 166.7 mL/hr at 02/08/21 1243 1,250 mg at 02/08/21 1243   Current Outpatient Medications  Medication Sig Dispense Refill   cephALEXin (KEFLEX) 500 MG capsule Take 1 capsule (500 mg total) by mouth 2 (two) times daily. 14 capsule 0   doxycycline (VIBRAMYCIN) 100 MG capsule Take 1 capsule (100 mg total) by mouth 2 (two) times daily. One po bid x 7 days 14 capsule 0   acetaminophen (TYLENOL) 500 MG tablet Take 500-1,000 mg by mouth every 8 (eight) hours as needed for mild pain or headache.      apixaban (ELIQUIS) 2.5 MG TABS tablet Take 1 tablet (2.5 mg total) by mouth 2 (two) times daily.  Dispense starter pack 60 tablet 11   cetirizine (ZYRTEC) 10 MG tablet Take 10 mg by mouth daily. As needed     Cholecalciferol (VITAMIN D) 50 MCG (2000 UT) CAPS Take 4,000 Units by mouth daily.     colchicine 0.6 MG tablet Take 2 pills followed by 1 pill 1 hour later. Then if no improvement in 2 days take 1 pill daily for up to 3 days. 10 tablet 0   Cranberry (AZO CRANBERRY GUMMIES) 500 MG CHEW Chew 2,000 mg by mouth daily.     Cyanocobalamin (B-12 PO) Take 1 tablet by mouth daily.      ezetimibe (ZETIA) 10 MG tablet Take 1 tablet (10 mg total) by mouth daily. 30 tablet 11   ferrous sulfate 325 (65 FE) MG tablet Take 1 tablet (325 mg total) by mouth 2 (two) times daily with a meal. 60 tablet 0   fluticasone (FLONASE) 50 MCG/ACT nasal spray Place 2 sprays into both nostrils daily as needed for allergies or rhinitis. 16 g 6   levothyroxine (SYNTHROID) 25 MCG tablet TAKE 1 TABLET BY MOUTH ONCE DAILY BEFOREBREAKFAST 30 tablet 3   losartan (COZAAR) 25 MG tablet Take 0.5 tablets (12.5 mg total) by mouth daily. 90 tablet 3   magnesium oxide (MAG-OX) 400 MG tablet Take 1 tablet (400 mg total) by mouth daily. 30 tablet 2   metolazone (ZAROXOLYN) 2.5 MG tablet Take 2.5 mg by mouth as needed.     mupirocin ointment (BACTROBAN) 2 % Apply 1 application topically daily. Qd to wound on foot 22 g 1   ondansetron (ZOFRAN)  4 MG tablet Take 1 tablet (4 mg total) by mouth every 8 (eight) hours as needed for nausea or vomiting. 20 tablet 1   pantoprazole (PROTONIX) 40 MG tablet TAKE 1 TABLET (40 MG TOTAL) BY MOUTH DAILY AT 6 (SIX) AM. 30 tablet 0   potassium chloride SA (KLOR-CON M20) 20 MEQ tablet Take 2 tablets (40 mEq total) by mouth 2 (two) times daily. Take 1 extra tablet on the days you take metolazone. 360 tablet 3   rosuvastatin (CRESTOR) 40 MG tablet Take 1 tablet (40 mg total) by mouth daily. 90 tablet 3   torsemide (DEMADEX) 100 MG tablet TAKE 1 TABLET BY MOUTH EVERY MORNING (EVENING DOSE 80 MG) 30 tablet 11    torsemide (DEMADEX) 20 MG tablet Take 4 tablets (80 mg total) by mouth every evening. To be taken with 100 mg tab in the AM 120 tablet 11   vitamin C (ASCORBIC ACID) 250 MG tablet Take 500 mg by mouth daily.      REVIEW OF SYSTEMS:  [X]  denotes positive finding, [ ]  denotes negative finding Cardiac  Comments:  Chest pain or chest pressure:    Shortness of breath upon exertion:    Short of breath when lying flat:    Irregular heart rhythm:        Vascular    Pain in calf, thigh, or hip brought on by ambulation:    Pain in feet at night that wakes you up from your sleep:     Blood clot in your veins:    Leg swelling:         Pulmonary    Oxygen at home:    Productive cough:     Wheezing:         Neurologic    Sudden weakness in arms or legs:     Sudden numbness in arms or legs:     Sudden onset of difficulty speaking or slurred speech:    Temporary loss of vision in one eye:     Problems with dizziness:         Gastrointestinal    Blood in stool:     Vomited blood:         Genitourinary    Burning when urinating:     Blood in urine:        Psychiatric    Major depression:         Hematologic    Bleeding problems:    Problems with blood clotting too easily:        Skin    Rashes or ulcers:        Constitutional    Fever or chills:      PHYSICAL EXAM Vitals:   02/08/21 1045 02/08/21 1115 02/08/21 1200 02/08/21 1230  BP: (!) 109/51 (!) 135/47 124/65 131/80  Pulse: 74 64 73 93  Resp: (!) 24 (!) 24 17 19   Temp:      TempSrc:      SpO2: 97% 99% 100% 93%  Weight:      Height:        Constitutional: chronically ill. Appears unwell. no distress. Appears well nourished.  Neurologic: CN intact. no focal findings. no sensory loss. Psychiatric:  Mood and affect symmetric and appropriate. Eyes:  No icterus. No conjunctival pallor. Ears, nose, throat:  mucous membranes moist. Midline trachea.  Cardiac: regular rate and rhythm.  Respiratory:   unlabored. Abdominal:  soft, non-tender, non-distended.  Peripheral vascular: 1+ femoral pulses bilaterally. No palpable pedal  pulses. Left lateral foot ulceration. Chronic venous insufficiency (reticular veins, telangiectasias). Extremity: No edema. No cyanosis. No pallor.  Skin: No gangrene.     Lymphatic: No Stemmer's sign. No palpable lymphadenopathy.  PERTINENT LABORATORY AND RADIOLOGIC DATA  Most recent CBC CBC Latest Ref Rng & Units 02/08/2021 01/16/2021 12/19/2020  WBC 4.0 - 10.5 K/uL 9.1 3.3(L) 3.5(L)  Hemoglobin 12.0 - 15.0 g/dL 10.2(L) 10.5(L) 10.7(L)  Hematocrit 36.0 - 46.0 % 32.3(L) 32.0(L) 35.6(L)  Platelets 150 - 400 K/uL 172 190.0 192     Most recent CMP CMP Latest Ref Rng & Units 02/08/2021 01/30/2021 12/19/2020  Glucose 70 - 99 mg/dL 124(H) 92 91  BUN 8 - 23 mg/dL 39(H) 36(H) 24(H)  Creatinine 0.44 - 1.00 mg/dL 1.63(H) 1.44(H) 1.57(H)  Sodium 135 - 145 mmol/L 135 139 137  Potassium 3.5 - 5.1 mmol/L 3.9 4.4 4.0  Chloride 98 - 111 mmol/L 96(L) 101 98  CO2 22 - 32 mmol/L 27 28 30   Calcium 8.9 - 10.3 mg/dL 9.5 9.5 9.3  Total Protein 6.5 - 8.1 g/dL 6.6 - -  Total Bilirubin 0.3 - 1.2 mg/dL 0.9 - -  Alkaline Phos 38 - 126 U/L 46 - -  AST 15 - 41 U/L 18 - -  ALT 0 - 44 U/L 15 - -    Renal function Estimated Creatinine Clearance: 24.5 mL/min (A) (by C-G formula based on SCr of 1.63 mg/dL (H)).  Hgb A1c MFr Bld (%)  Date Value  08/17/2014 6.1 (H)    Ldl Cholesterol, Calc  Date Value Ref Range Status  06/09/2014 183 (H) 0 - 100 mg/dL Final   LDL Cholesterol  Date Value Ref Range Status  06/03/2019 79 0 - 99 mg/dL Final    Comment:           Total Cholesterol/HDL:CHD Risk Coronary Heart Disease Risk Table                     Men   Women  1/2 Average Risk   3.4   3.3  Average Risk       5.0   4.4  2 X Average Risk   9.6   7.1  3 X Average Risk  23.4   11.0        Use the calculated Patient Ratio above and the CHD Risk Table to determine the patient's CHD  Risk.        ATP III CLASSIFICATION (LDL):  <100     mg/dL   Optimal  100-129  mg/dL   Near or Above                    Optimal  130-159  mg/dL   Borderline  160-189  mg/dL   High  >190     mg/dL   Very High Performed at [facility] [address] [place], [place] [address]    Direct LDL  Date Value Ref Range Status  11/07/2016 146.0 mg/dL Final    Comment:    Optimal:  <100 mg/dLNear or Above Optimal:  100-129 mg/dLBorderline High:  130-159 mg/dLHigh:  160-189 mg/dLVery High:  >190 mg/dL    [name]. Stanford Breed, MD [facility] Phone Number: [phone] 02/08/2021 1:23 PM  Total time spent on preparing this encounter including chart review, data review, collecting history, examining the patient, coordinating care for this established patient, >40 minutes.  Portions of this report may have been transcribed  using voice recognition software.  Every effort has been made to ensure accuracy; however, inadvertent computerized transcription errors may still be present.

## 2021-02-08 NOTE — ED Triage Notes (Signed)
Pt has wound to left foot from having cancerous cells removed. Per pt, wound has been present since August. Redness and swelling noted, pt c/o fevers at home.

## 2021-02-08 NOTE — Progress Notes (Addendum)
ABI's have been completed. Preliminary results can be found in CV Proc through chart review.  Results were given to Dr. Billy Fischer.  02/08/21 2:23 PM [name] RVT

## 2021-02-09 DIAGNOSIS — R195 Other fecal abnormalities: Secondary | ICD-10-CM

## 2021-02-09 DIAGNOSIS — Z7901 Long term (current) use of anticoagulants: Secondary | ICD-10-CM

## 2021-02-09 LAB — LIPID PANEL
Cholesterol: 101 mg/dL (ref 0–200)
HDL: 28 mg/dL — ABNORMAL LOW (ref >40)
LDL Cholesterol: 58 mg/dL (ref 0–99)
Total CHOL/HDL Ratio: 3.6 RATIO
Triglycerides: 74 mg/dL (ref <150)
VLDL: 15 mg/dL (ref 0–40)

## 2021-02-09 LAB — CBC
HCT: 27.8 % — ABNORMAL LOW (ref 36.0–46.0)
HCT: 29.3 % — ABNORMAL LOW (ref 36.0–46.0)
HCT: 31.3 % — ABNORMAL LOW (ref 36.0–46.0)
Hemoglobin: 8.7 g/dL — ABNORMAL LOW (ref 12.0–15.0)
Hemoglobin: 9.1 g/dL — ABNORMAL LOW (ref 12.0–15.0)
Hemoglobin: 9.6 g/dL — ABNORMAL LOW (ref 12.0–15.0)
MCH: 28.9 pg (ref 26.0–34.0)
MCH: 28.6 pg (ref 26.0–34.0)
MCH: 28.7 pg (ref 26.0–34.0)
MCHC: 31.3 g/dL (ref 30.0–36.0)
MCHC: 31.1 g/dL (ref 30.0–36.0)
MCHC: 30.7 g/dL (ref 30.0–36.0)
MCV: 92.4 fL (ref 80.0–100.0)
MCV: 92.1 fL (ref 80.0–100.0)
MCV: 93.7 fL (ref 80.0–100.0)
Platelets: 130 10*3/uL — ABNORMAL LOW (ref 150–400)
Platelets: 151 10*3/uL (ref 150–400)
Platelets: 158 10*3/uL (ref 150–400)
RBC: 3.01 MIL/uL — ABNORMAL LOW (ref 3.87–5.11)
RBC: 3.18 MIL/uL — ABNORMAL LOW (ref 3.87–5.11)
RBC: 3.34 MIL/uL — ABNORMAL LOW (ref 3.87–5.11)
RDW: 17 % — ABNORMAL HIGH (ref 11.5–15.5)
RDW: 16.7 % — ABNORMAL HIGH (ref 11.5–15.5)
RDW: 16.8 % — ABNORMAL HIGH (ref 11.5–15.5)
WBC: 7.2 10*3/uL (ref 4.0–10.5)
WBC: 6.5 10*3/uL (ref 4.0–10.5)
WBC: 7.7 10*3/uL (ref 4.0–10.5)
nRBC: 0 % (ref 0.0–0.2)
nRBC: 0 % (ref 0.0–0.2)
nRBC: 0 % (ref 0.0–0.2)

## 2021-02-09 LAB — BASIC METABOLIC PANEL
Anion gap: 10 (ref 5–15)
BUN: 48 mg/dL — ABNORMAL HIGH (ref 8–23)
CO2: 24 mmol/L (ref 22–32)
Calcium: 8.1 mg/dL — ABNORMAL LOW (ref 8.9–10.3)
Chloride: 97 mmol/L — ABNORMAL LOW (ref 98–111)
Creatinine, Ser: 1.89 mg/dL — ABNORMAL HIGH (ref 0.44–1.00)
GFR, Estimated: 26 mL/min — ABNORMAL LOW (ref >60)
Glucose, Bld: 103 mg/dL — ABNORMAL HIGH (ref 70–99)
Potassium: 3.7 mmol/L (ref 3.5–5.1)
Sodium: 131 mmol/L — ABNORMAL LOW (ref 135–145)

## 2021-02-09 LAB — HEMOGLOBIN A1C
Hgb A1c MFr Bld: 4.6 % — ABNORMAL LOW (ref 4.8–5.6)
Mean Plasma Glucose: 85.32 mg/dL

## 2021-02-09 MED ORDER — VANCOMYCIN HCL IN DEXTROSE 1-5 GM/200ML-% IV SOLN
1000.0000 mg | INTRAVENOUS | Status: DC
  Administered 2021-02-10: 1000 mg via INTRAVENOUS
  Filled 2021-02-09 (×2): qty 200

## 2021-02-09 MED ORDER — PANTOPRAZOLE SODIUM 40 MG IV SOLR
40.0000 mg | Freq: Two times a day (BID) | INTRAVENOUS | Status: DC
Start: 2021-02-09 — End: 2021-02-14
  Administered 2021-02-09 – 2021-02-13 (×10): 40 mg via INTRAVENOUS
  Filled 2021-02-09 (×10): qty 40

## 2021-02-09 MED ORDER — MIDODRINE HCL 5 MG PO TABS
5.0000 mg | ORAL_TABLET | Freq: Three times a day (TID) | ORAL | Status: DC
  Administered 2021-02-09 – 2021-02-13 (×8): 5 mg via ORAL
  Filled 2021-02-09 (×7): qty 1

## 2021-02-09 NOTE — H&P (View-Only) (Signed)
Referring Provider:  [facility]         Primary Care Physician:  Lesleigh Noe, MD Primary Gastroenterologist:   Oretha Caprice, MD  (hospital consult Oct 2020)     Reason for Consultation:   black stool, anemia                ASSESSMENT / PLAN   # 87 year old female with recurrent acute on chronic anemia and black stool (on iron) but heme positive. She takes Eliquis. 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 Baseline hgb mid 10 to mid 11 range currently at 9.1. Eliquis on hold since admission 02/08/21.  --Probably recurrent small bowel AVM bleed  --Will need EGD / small bowel enteroscopy at some point, ? Possibly tomorrow. She had solids for breakfast today. She is scheduled for an angiogram with Vascular Surgery on Monday.  --Continue BID PPI --Trend hgb  # History of ischemic colitis in 2020. At that time she was apparently having mild rectal bleeding. Recurrent ischemic colitis unlikely in absence of rectal bleeding, leukocytosis or abdominal pain  # Thrombocytopenia, new this admission. Secondary to sepsis most likely.   # Sepsis from LLE cellulitis. Non-healing wound on left foot where skin cancer was recently removed. On antibiotics. She has known PAD, see below  # PAD. Vascular Surgery planning angiogram on Monday after Eliquis washout.   # A-Fib, home Eliquis is on hold.      Attending Physician Note   I have taken a history, reviewed the chart and examined the patient. I personally saw the patient and performed a substantive portion of this encounter, including a complete performance of at least one of the key components, in conjunction with the APP. I agree with the APP's note, impression and recommendations.   Impression: *Worsening anemia with dark heme positive stool. 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 Suspected bleeding from AVMs. R/O ulcer and other causes.   *LLE cellulitis with sepsis on  antibiotics  *PAD with plans for angiogram on Monday.   Recommendation: *Continue to hold Eliquis. Hold iron. PPI IV bid. Trend CBC. Schedule SBE with Dr. Benson Norway tomorrow.   *Drs. Mann & Kennard covering for the weekend.   Lucio Edward, MD [facility] See AMION, [facility], for our on call provider      HISTORY OF PRESENT ILLNESS                                                                                                                         Chief Complaint: anemia  [name] is a 87 y.o. female with a past medical history significant for A-Fib, ischemic colitis ( 2020),  cardiomyopathy s/p ICD, COPD, hypothyroidism, TIA, CKD 3, renal cell cancer s/p right nephrectomy, peripheral artery disease , cholecystectomy, diverticulosis. See PMH for any additional medical history.   We saw patient in the hospital October 2020 for evaluation  of acute on chronic anemia on coumadin.  Inpatient EGD showed mild gastritis and ischemic colitis on colonoscopy with biopsies. During that admission she continued to require blood transfusions for symptomatic IDA so had small bowel video capsule which showed a small AVM in duodenum and small streak of blood beyond that but no active bleeding. She was treated with IV iron  Patient readmitted with recurrent November 2021 with acute on chronic anemia and melena in setting of supratherapeutic INR. Eventually had push enteroscopy  with findings of two jejunal angiectasias , one actively bleeding treated with APC.   ED course:  Patient present to ED on 10/6 with fever. She has left foot pain where squamous cell cancer had recently been removed.  She also reported black stools and was scheduled for an appointment to see [place] in the office on the 18th.  She takes Eliquis.  She was admitted with GI bleed , sepsis secondary to cellulitis of her left foot,   and also being treated for COPD exacerbation . Vascular surgery was consulted for nonhealing wound in setting of  known peripheral artery disease.  Plan is for angiogram on Monday after Eliquis washout.   Baseline hgb in mid 10  to mid 11 range, it has declined this admission to 8.7 this am. Repeat CBC at 11 am showed hgb of 9.1. She has not required transfusion this admission. Patient takes oral iron but really isn't sure if it causes her stools to be dark but that have been black for a few days. Stools vary in consistency from formed to loose. She says her PCP has made her a follow up with [place] due to declining hgb. She has no abdominal pain but endorses nausea without vomiting of the last week. No NSAID use.     PREVIOUS ENDOSCOPIC EVALUATIONS   Oct 2020 EGD and colonoscopy for iron deficiency anemia --Gastritis --Patchy, moderate inflammation in the sigmoid colon. I have most concern for ischemic, possibly chronic. Two sites were biopsied. These findings could possibly explain acute on chronic, heme + anemia.  A. COLON, PROXIMAL SIGMOID, BIOPSY:  Colitis with surface exudate consistent with ischemic colitis.  No granulomas or malignancy.   B. COLON, DISTAL SIGMOID, BIOPSY:  Colitis with focal necrosis consistent with ischemic colitis.  No granulomas or malignancy.   C. STOMACH, ANTRUM AND BODY, BIOPSY:  Antral and corpus mucosa with slight chronic inflammation  Warthin-Starry negative for Helicobacter pylori.  No intestinal metaplasia, dysplasia or malignancy.    November 2020 video capsule study --Complete study, adequate prep.  No active bleeding.  1 AVM at 2 hours and 51 minutes.  Small streak of heme at 3 hours and 6 minutes  03/30/20 small bowel enteroscopy --normal esophagus --a few gastric polyps. - Normal mucosa was found in the entire stomach. - A single non-bleeding angioectasia in the duodenum. Treated with argon plasma coagulation (APC). - Two angioectasias in the jejunum, one which was actively bleeding. Treated with argon plasma coagulation (APC). - No specimens  collected.  Past Medical History:  Diagnosis Date   Actinic keratosis 01/17/2015   R forearm   Adjustment disorder with anxiety    Anemia    Anginal pain ([place])    Arthritis    "some in my hands" (11/11/2012)   Atrial fibrillation (HCC)    Atypical mole 03/25/2018   L forearm - severe   Automatic implantable cardioverter-defibrillator in situ    Blood in stool    Carotid artery stenosis 09/2007   a.  09/2007: 60-79% bilateral (stable); b. 10/2008: 40-59% R 60-79%    Chest pain, unspecified    Chronic airway obstruction, not elsewhere classified    Chronic bronchitis ([place])    "get it some; not q year" (11/11/2012)   Chronic diastolic CHF (congestive heart failure) ([place]) 06/04/2013   Chronic kidney disease, unspecified    Coronary artery disease    non-obstructive by 2006 cath   Displaced fracture of left femoral neck ([place]) 10/09/2018   Frequent UTI    "get them a couple times/yr" (11/11/2012)   GI bleed 03/28/2020   Hemorrhage of rectum and anus    High cholesterol    History of blood transfusion 04/2011   "after hip OR" (11/11/2012)   Hx of renal cell cancer    Hypertension 05/20/2011   Hypertr obst cardiomyop    Hypotension, unspecified    cardiac cath 2006..nonobstructive CAD 30-40s lesions.Marland KitchenETT 1/09 nondiagnostic due to poor HR response..Right Renal Cancer 2003   Long term (current) use of anticoagulants    Malignant neoplasm of kidney, except pelvis    Osteoarthritis of right hip    Other and unspecified coagulation defects    PONV (postoperative nausea and vomiting)    RECTAL BLEEDING 10/13/2009   Qualifier: Diagnosis of  By: Chester Holstein NP, Paula     Renal cancer [facility]) 06/2001   Right   Secondary cardiomyopathy, unspecified    Sinus bradycardia    Squamous cell carcinoma of skin 01/17/2015   R lat wrist   Squamous cell carcinoma of skin 01/29/2018   R post upper leg - superficially invasive   Squamous cell carcinoma of skin 03/25/2018   L lat foot   Squamous cell  carcinoma of skin 11/09/2020   left lat foot - [facility] 01/01/21   Urge incontinence     Past Surgical History:  Procedure Laterality Date   ABDOMINAL HYSTERECTOMY  1975   for benign causes   APPENDECTOMY     BI-VENTRICULAR PACEMAKER UPGRADE  05/04/2010   BIOPSY  02/28/2019   Procedure: BIOPSY;  Surgeon: Milus Banister, MD;  Location: [place];  Service: Endoscopy;;   CARDIAC CATHETERIZATION  2006   CARDIOVERSION N/A 02/20/2018   Procedure: CARDIOVERSION;  Surgeon: Minna Merritts, MD;  Location: [place] [facility];  Service: Cardiovascular;  Laterality: N/A;   CARDIOVERSION N/A 03/27/2018   Procedure: CARDIOVERSION;  Surgeon: Minna Merritts, MD;  Location: [facility];  Service: Cardiovascular;  Laterality: N/A;   CATARACT EXTRACTION W/ INTRAOCULAR LENS  IMPLANT, BILATERAL  01/2006-02-2006   CHOLECYSTECTOMY N/A 11/11/2012   Procedure: LAPAROSCOPIC CHOLECYSTECTOMY WITH INTRAOPERATIVE CHOLANGIOGRAM;  Surgeon: Imogene Burn. Georgette Dover, MD;  Location: [place];  Service: General;  Laterality: N/A;   COLONOSCOPY WITH PROPOFOL N/A 02/28/2019   Procedure: COLONOSCOPY WITH PROPOFOL;  Surgeon: Milus Banister, MD;  Location: [facility] ENDOSCOPY;  Service: Endoscopy;  Laterality: N/A;   ENTEROSCOPY N/A 03/30/2020   Procedure: ENTEROSCOPY;  Surgeon: Lavena Bullion, DO;  Location: [place];  Service: Gastroenterology;  Laterality: N/A;   EP IMPLANTABLE DEVICE N/A 02/21/2016   Procedure: ICD Generator Changeout;  Surgeon: Deboraha Sprang, MD;  Location: [place] [facility];  Service: Cardiovascular;  Laterality: N/A;   ESOPHAGOGASTRODUODENOSCOPY (EGD) WITH PROPOFOL N/A 02/28/2019   Procedure: ESOPHAGOGASTRODUODENOSCOPY (EGD) WITH PROPOFOL;  Surgeon: Milus Banister, MD;  Location: [facility] ENDOSCOPY;  Service: Endoscopy;  Laterality: N/A;   GIVENS CAPSULE STUDY N/A 03/15/2019   Procedure: GIVENS CAPSULE STUDY;  Surgeon: Thornton Park, MD;  Location: [place];  Service: Gastroenterology;  Laterality: N/A;   HOT  HEMOSTASIS N/A 03/30/2020   Procedure: HOT HEMOSTASIS (ARGON PLASMA COAGULATION/BICAP);  Surgeon: Lavena Bullion, DO;  Location: [facility] ENDOSCOPY;  Service: Gastroenterology;  Laterality: N/A;   INSERT / REPLACE / REMOVE PACEMAKER  05-01-11   02-28-05-/05-04-10-ICD-MEDTRONIC MAXIMAL DR   JOINT REPLACEMENT     LAPAROSCOPIC CHOLECYSTECTOMY  11/11/2012   LAPAROSCOPIC LYSIS OF ADHESIONS N/A 11/11/2012   Procedure: LAPAROSCOPIC LYSIS OF ADHESIONS;  Surgeon: Imogene Burn. Georgette Dover, MD;  Location: [place];  Service: General;  Laterality: N/A;   NEPHRECTOMY Right 06/2001    S/P RENAL CELL CANCER   PRESSURE SENSOR/CARDIOMEMS N/A 02/03/2019   Procedure: PRESSURE SENSOR/CARDIOMEMS;  Surgeon: Larey Dresser, MD;  Location: [place] [facility];  Service: Cardiovascular;  Laterality: N/A;   RIGHT HEART CATH N/A 11/09/2018   Procedure: RIGHT HEART CATH;  Surgeon: Larey Dresser, MD;  Location: [place] [facility];  Service: Cardiovascular;  Laterality: N/A;   RIGHT HEART CATH N/A 03/08/2019   Procedure: RIGHT HEART CATH;  Surgeon: Larey Dresser, MD;  Location: [place] [facility];  Service: Cardiovascular;  Laterality: N/A;   SUBMUCOSAL TATTOO INJECTION  02/28/2019   Procedure: SUBMUCOSAL TATTOO INJECTION;  Surgeon: Milus Banister, MD;  Location: [facility] ENDOSCOPY;  Service: Endoscopy;;   TOTAL HIP ARTHROPLASTY Right 05/03/2011   Procedure: TOTAL HIP ARTHROPLASTY ANTERIOR APPROACH;  Surgeon: Mcarthur Rossetti;  Location: [facility];  Service: Orthopedics;  Laterality: Right;  Removal of Cannulated Screws Right Hip, Right Direct Anterior Hip Replacement   TOTAL HIP ARTHROPLASTY Left 10/09/2018   Procedure: TOTAL HIP ARTHROPLASTY ANTERIOR APPROACH;  Surgeon: Rod Can, MD;  Location: [place];  Service: Orthopedics;  Laterality: Left;    Prior to Admission medications   Medication Sig Start Date End Date Taking? Authorizing Provider  acetaminophen (TYLENOL) 500 MG tablet Take 500-1,000 mg by mouth every 8 (eight) hours  as needed for mild pain or headache.    Yes [provider]  apixaban (ELIQUIS) 2.5 MG TABS tablet Take 1 tablet (2.5 mg total) by mouth 2 (two) times daily. Dispense starter pack Patient taking differently: Take 2.5 mg by mouth 2 (two) times daily. 05/17/20  Yes Larey Dresser, MD  cephALEXin (KEFLEX) 500 MG capsule Take 1 capsule (500 mg total) by mouth 2 (two) times daily. 02/08/21  Yes Ripley Fraise, MD  Cholecalciferol (VITAMIN D) 50 MCG (2000 UT) CAPS Take 4,000 Units by mouth daily.   Yes [provider]  colchicine 0.6 MG tablet Take 2 pills followed by 1 pill 1 hour later. Then if no improvement in 2 days take 1 pill daily for up to 3 days. Patient taking differently: Take 0.6 mg by mouth daily. 01/30/21  Yes Lesleigh Noe, MD  Cranberry (AZO CRANBERRY GUMMIES) 500 MG CHEW Chew 2,000 mg by mouth daily.   Yes [provider]  Cyanocobalamin (B-12 PO) Take 1 tablet by mouth daily.    Yes [provider]  doxycycline (VIBRAMYCIN) 100 MG capsule Take 1 capsule (100 mg total) by mouth 2 (two) times daily. One po bid x 7 days 02/08/21  Yes Ripley Fraise, MD  ezetimibe (ZETIA) 10 MG tablet Take 1 tablet (10 mg total) by mouth daily. 06/21/20  Yes Milford, Maricela Bo, FNP  ferrous sulfate 325 (65 FE) MG tablet Take 1 tablet (325 mg total) by mouth 2 (two) times daily with a meal. 03/17/19  Yes Adhikari, Tamsen Meek, MD  fluticasone (FLONASE) 50 MCG/ACT nasal spray Place 2  sprays into both nostrils daily as needed for allergies or rhinitis. 07/27/19  Yes Elby Beck, FNP  levothyroxine (SYNTHROID) 25 MCG tablet TAKE 1 TABLET BY MOUTH ONCE DAILY BEFOREBREAKFAST Patient taking differently: Take 25 mcg by mouth daily before breakfast. 04/19/19  Yes Deboraha Sprang, MD  losartan (COZAAR) 25 MG tablet Take 0.5 tablets (12.5 mg total) by mouth daily. 12/19/20 03/19/21 Yes Milford, Maricela Bo, FNP  magnesium oxide (MAG-OX) 400 MG tablet Take 1 tablet (400 mg total) by  mouth daily. 06/05/18  Yes Deboraha Sprang, MD  metolazone (ZAROXOLYN) 2.5 MG tablet Take 2.5 mg by mouth daily as needed (For fluid).   Yes [provider]  mupirocin ointment (BACTROBAN) 2 % Apply 1 application topically daily. Qd to wound on foot Patient taking differently: Apply 1 application topically daily. 01/09/21  Yes Brendolyn Patty, MD  ondansetron (ZOFRAN) 4 MG tablet Take 1 tablet (4 mg total) by mouth every 8 (eight) hours as needed for nausea or vomiting. 04/17/20  Yes Elby Beck, FNP  pantoprazole (PROTONIX) 40 MG tablet TAKE 1 TABLET (40 MG TOTAL) BY MOUTH DAILY AT 6 (SIX) AM. Patient taking differently: Take 40 mg by mouth daily. 03/31/20 03/31/21 Yes Swayze, Ava, DO  potassium chloride SA (KLOR-CON M20) 20 MEQ tablet Take 2 tablets (40 mEq total) by mouth 2 (two) times daily. Take 1 extra tablet on the days you take metolazone. 06/29/20  Yes Larey Dresser, MD  rosuvastatin (CRESTOR) 40 MG tablet Take 1 tablet (40 mg total) by mouth daily. 11/11/18  Yes Larey Dresser, MD  torsemide (DEMADEX) 100 MG tablet TAKE 1 TABLET BY MOUTH EVERY MORNING (EVENING DOSE 80 MG) Patient taking differently: Take 100 mg by mouth daily. 01/05/21  Yes Milford, Maricela Bo, FNP  vitamin C (ASCORBIC ACID) 250 MG tablet Take 500 mg by mouth daily.   Yes [provider]  torsemide (DEMADEX) 20 MG tablet Take 4 tablets (80 mg total) by mouth every evening. To be taken with 100 mg tab in the AM Patient not taking: Reported on 02/08/2021 01/05/21 04/05/21  Rafael Bihari, FNP    Current Facility-Administered Medications  Medication Dose Route Frequency Provider Last Rate Last Admin   acetaminophen (TYLENOL) tablet 650 mg  650 mg Oral Q6H PRN Norval Morton, MD       Or   acetaminophen (TYLENOL) suppository 650 mg  650 mg Rectal Q6H PRN Fuller Plan A, MD       albuterol (PROVENTIL) (2.5 MG/3ML) 0.083% nebulizer solution 2.5 mg  2.5 mg Nebulization Q6H PRN Smith, Rondell A, MD        ceFEPIme (MAXIPIME) 2 g in sodium chloride 0.9 % 100 mL IVPB  2 g Intravenous Q24H Gareth [name], MD 200 mL/hr at 02/09/21 0952 2 g at 02/09/21 0952   fluticasone (FLONASE) 50 MCG/ACT nasal spray 2 spray  2 spray Each Nare Daily PRN Fuller Plan A, MD       lactated ringers infusion   Intravenous Continuous Chotiner, [name], MD 50 mL/hr at 02/09/21 0103 New Bag at 02/09/21 0103   levothyroxine (SYNTHROID) tablet 25 mcg  25 mcg Oral Q0600 Fuller Plan A, MD   25 mcg at 02/09/21 0607   lidocaine (LIDODERM) 5 % 2 patch  2 patch Transdermal Q24H Fuller Plan A, MD   2 patch at 02/08/21 1821   midodrine (PROAMATINE) tablet 5 mg  5 mg Oral TID WC Ghimire, Henreitta Leber, MD   5 mg  at 02/09/21 0938   ondansetron (ZOFRAN) tablet 4 mg  4 mg Oral Q6H PRN Fuller Plan A, MD       Or   ondansetron (ZOFRAN) injection 4 mg  4 mg Intravenous Q6H PRN Tamala Julian, Rondell A, MD       pantoprazole (PROTONIX) injection 40 mg  40 mg Intravenous Q12H Ghimire, Henreitta Leber, MD       rosuvastatin (CRESTOR) tablet 40 mg  40 mg Oral Daily Smith, Rondell A, MD   40 mg at 02/09/21 7510   sodium chloride flush (NS) 0.9 % injection 3 mL  3 mL Intravenous Q12H Smith, Rondell A, MD   3 mL at 02/09/21 0939   traMADol (ULTRAM) tablet 50 mg  50 mg Oral Q6H PRN Norval Morton, MD       traMADol (ULTRAM) tablet 50 mg  50 mg Oral Once Heloise Purpura, [facility]       [START ON 02/10/2021] vancomycin (VANCOREADY) IVPB 1250 mg/250 mL  1,250 mg Intravenous Q48H Smith, Rondell A, MD        Allergies as of 02/08/2021 - Review Complete 02/08/2021  Allergen Reaction Noted   Dilaudid [hydromorphone] Other (See Comments) 11/06/2018   Fentanyl Other (See Comments) 02/05/2019   Codeine Nausea And Vomiting    Morphine and related Nausea And Vomiting 11/04/2012   Whole blood Itching and Nausea And Vomiting 02/08/2021   Sulfonamide derivatives Other (See Comments)     Family History  Problem Relation Age of Onset   Heart failure Mother     Early death Father        car accident   Breast cancer Maternal Aunt 82   Breast cancer Cousin    Breast cancer Other    Colon cancer Neg Hx     Social History   Socioeconomic History   Marital status: Widowed    Spouse name: Not on file   Number of children: 2   Years of education: high school   Highest education level: Not on file  Occupational History   Occupation: Retired    Fish farm manager: RETIRED  Tobacco Use   Smoking status: Former    Packs/day: 0.50    Years: 40.00    Pack years: 20.00    Types: Cigarettes    Quit date: 05/06/2001    Years since quitting: 19.7   Smokeless tobacco: Never  Vaping Use   Vaping Use: Never used  Substance and Sexual Activity   Alcohol use: No    Alcohol/week: 0.0 standard drinks   Drug use: No   Sexual activity: Not Currently  Other Topics Concern   Not on file  Social History Narrative   Would desire CPR   07/11/20   From: the area   Living: with great-grandson - [name] (2003)   Work: retired - clerical      Family: 2 children - [name] and [place] - 2 grand chlidren - 5 great grand children      Enjoys: spending time with friends - watch move, eat out, spend time      Exercise: walking around the house   Diet: not good, limits red meat, limits salt, low appetite      Safety   Seat belts: Yes    Guns: Yes  and secure   Safe in relationships: Yes    Social Determinants of Health   Financial Resource Strain: Not on file  Food Insecurity: Not on file  Transportation Needs: Not on file  Physical Activity: Not  on file  Stress: Not on file  Social Connections: Not on file  Intimate Partner Violence: Not on file    Review of Systems: All systems reviewed and negative except where noted in HPI.  OBJECTIVE    Physical Exam: Vital signs in last 24 hours: Temp:  [98 F (36.7 C)-98.6 F (37 C)] 98.3 F (36.8 C) (10/07 0220) Pulse Rate:  [60-101] 60 (10/07 0220) Resp:  [17-29] 19 (10/07 0220) BP: (72-138)/(34-81) 100/55  (10/07 0220) SpO2:  [93 %-100 %] 97 % (10/07 0220) Weight:  [64.2 kg] 64.2 kg (10/06 2340)   General:   Alert  female in NAD Psych:  Pleasant, cooperative. Normal mood and affect. Eyes:  Pupils equal, sclera clear, no icterus.   Conjunctiva pink. Ears:  Normal auditory acuity. Nose:  No deformity, discharge,  or lesions. Neck:  Supple; no masses Lungs:  Clear throughout to auscultation.   No wheezes, crackles, or rhonchi.  Heart:  Regular rate and rhythm;  loud murmur.  Abdomen:  Soft, non-distended, nontender, BS active, no palp mass   Rectal:  Deferred  Msk:  Symmetrical without gross deformities. Neurologic:  Alert and  oriented x4;  grossly normal neurologically. Skin:  LLE foot / leg up to mid tibia with erythema and edema. Non-draining round wound (slightly small than a quarter) on lateral aspect of left foot   Scheduled inpatient medications  levothyroxine  25 mcg Oral Q0600   lidocaine  2 patch Transdermal Q24H   midodrine  5 mg Oral TID WC   pantoprazole (PROTONIX) IV  40 mg Intravenous Q12H   rosuvastatin  40 mg Oral Daily   sodium chloride flush  3 mL Intravenous Q12H   traMADol  50 mg Oral Once      Intake/Output from previous day: 10/06 0701 - 10/07 0700 In: 1256.3 [I.V.:500.7; IV Piggyback:755.7] Out: 1 [Urine:1] Intake/Output this shift: No intake/output data recorded.   Lab Results: Recent Labs    02/08/21 0156 02/09/21 0207  WBC 9.1 7.2  HGB 10.2* 8.7*  HCT 32.3* 27.8*  PLT 172 130*   BMET Recent Labs    02/08/21 0156 02/09/21 0207  NA 135 131*  K 3.9 3.7  CL 96* 97*  CO2 27 24  GLUCOSE 124* 103*  BUN 39* 48*  CREATININE 1.63* 1.89*  CALCIUM 9.5 8.1*   LFT Recent Labs    02/08/21 0156  PROT 6.6  ALBUMIN 4.0  AST 18  ALT 15  ALKPHOS 46  BILITOT 0.9   PT/INR No results for input(s): LABPROT, INR in the last 72 hours. Hepatitis Panel No results for input(s): HEPBSAG, HCVAB, HEPAIGM, HEPBIGM in the last 72 hours.   . CBC  Latest Ref Rng & Units 02/09/2021 02/08/2021 01/16/2021  WBC 4.0 - 10.5 K/uL 7.2 9.1 3.3(L)  Hemoglobin 12.0 - 15.0 g/dL 8.7(L) 10.2(L) 10.5(L)  Hematocrit 36.0 - 46.0 % 27.8(L) 32.3(L) 32.0(L)  Platelets 150 - 400 K/uL 130(L) 172 190.0    . CMP Latest Ref Rng & Units 02/09/2021 02/08/2021 01/30/2021  Glucose 70 - 99 mg/dL 103(H) 124(H) 92  BUN 8 - 23 mg/dL 48(H) 39(H) 36(H)  Creatinine 0.44 - 1.00 mg/dL 1.89(H) 1.63(H) 1.44(H)  Sodium 135 - 145 mmol/L 131(L) 135 139  Potassium 3.5 - 5.1 mmol/L 3.7 3.9 4.4  Chloride 98 - 111 mmol/L 97(L) 96(L) 101  CO2 22 - 32 mmol/L 24 27 28   Calcium 8.9 - 10.3 mg/dL 8.1(L) 9.5 9.5  Total Protein 6.5 - 8.1 g/dL - 6.6 -  Total Bilirubin 0.3 - 1.2 mg/dL - 0.9 -  Alkaline Phos 38 - 126 U/L - 46 -  AST 15 - 41 U/L - 18 -  ALT 0 - 44 U/L - 15 -   Studies/Results: DG Ankle Complete Left  Result Date: 02/08/2021 CLINICAL DATA:  Left lateral foot wound, left ankle pain. EXAM: LEFT ANKLE COMPLETE - 3+ VIEW COMPARISON:  Foot radiograph 11/10/2019 FINDINGS: Mild likely chronic periosteal reaction along the distal tibia medially, probably from chronic venous insufficiency. No fracture or acute bony findings. No bony destructive findings characteristic of osteomyelitis, I do note that the posterior portion of the calcaneus was excluded on the lateral projection and if this is a specific area of concern thin please notify the radiology department and we can attempt another lateral view. There is soft tissue swelling and subcutaneous edema along the lower calf and ankle. IMPRESSION: 1. Diffuse subcutaneous edema. 2. No bony destructive findings characteristic of osteomyelitis in the ankle region. Please note the posterior calcaneus is excluded on the lateral projection. 3. Likely chronic periosteal reaction along the distal tibia, most likely from chronic venous insufficiency. Electronically Signed   By: Van Clines M.D.   On: 02/08/2021 12:10   DG Chest Port 1  View  Result Date: 02/08/2021 CLINICAL DATA:  87 year old female with history of fever. EXAM: PORTABLE CHEST 1 VIEW COMPARISON:  Chest x-ray 05/09/2020. FINDINGS: Lung volumes are normal. No consolidative airspace disease. No pleural effusions. No pneumothorax. Multiple small calcified granulomas are noted throughout the lungs bilaterally. No other definite suspicious appearing pulmonary nodules or masses are noted. No evidence of pulmonary edema. Heart size is mildly enlarged. Upper mediastinal contours are within normal limits. Atherosclerotic calcifications are noted in the thoracic aorta. Left-sided pacemaker/AICD with lead tips projecting over the expected location of the right atrium and right ventricle. IMPRESSION: 1. No radiographic evidence of acute cardiopulmonary disease. 2. Aortic atherosclerosis. 3. Mild cardiomegaly. Electronically Signed   By: Vinnie Langton M.D.   On: 02/08/2021 05:06   VAS [place] ABI WITH/WO TBI  Result Date: 02/08/2021  LOWER EXTREMITY DOPPLER STUDY Patient Name:  BRIGET SHAHEED  Date of Exam:   02/08/2021 Medical Rec #: [id number]          Accession #:    [id number] Date of Birth: 1933/12/04          Patient Gender: F Patient Age:   59 years Exam Location:  [facility] [place] Procedure:      VAS [place] ABI WITH/WO TBI Referring Phys: Gareth [name] --------------------------------------------------------------------------------  Indications: Ulceration. High Risk Factors: Hypertension, hyperlipidemia.  Limitations: Today's exam was limited due to patient intolerant to cuff              pressure. Comparison Study: 03/03/2019 - Unable to get pressures. Waveforms and toe                   pressures are abnormal                    Summary:                   Right: The right toe-brachial index is abnormal.                    Unable to get pressures. Waveforms and toe pressures are                   abnormal. Performing Technologist: Carlos Levering RVT  Examination Guidelines: A  complete evaluation includes at minimum, Doppler waveform signals and systolic blood pressure reading at the level of bilateral brachial, anterior tibial, and posterior tibial arteries, when vessel segments are accessible. Bilateral testing is considered an integral part of a complete examination. Photoelectric Plethysmograph (PPG) waveforms and toe systolic pressure readings are included as required and additional duplex testing as needed. Limited examinations for reoccurring indications may be performed as noted.  ABI Findings: +--------+------------------+-----+----------+--------+ Right   Rt Pressure (mmHg)IndexWaveform  Comment  +--------+------------------+-----+----------+--------+ [id number]                    triphasic          +--------+------------------+-----+----------+--------+ PTA     84                0.67 monophasic         +--------+------------------+-----+----------+--------+ DP      61                0.49 monophasic         +--------+------------------+-----+----------+--------+ +--------+------------------+-----+----------+-------+ Left    Lt Pressure (mmHg)IndexWaveform  Comment +--------+------------------+-----+----------+-------+ [id number]                    triphasic         +--------+------------------+-----+----------+-------+ PTA     64                0.51 monophasic        +--------+------------------+-----+----------+-------+ DP      60                0.48 monophasic        +--------+------------------+-----+----------+-------+ +-------+-----------+-----------+------------+------------+ ABI/TBIToday's ABIToday's TBIPrevious ABIPrevious TBI +-------+-----------+-----------+------------+------------+ Right  0.67                                           +-------+-----------+-----------+------------+------------+ Left   0.51                                            +-------+-----------+-----------+------------+------------+  Summary: Right: Resting right ankle-brachial index indicates moderate right lower extremity arterial disease. Unable to obtain TBI due to low amplitude waveforms. Left: Resting left ankle-brachial index indicates moderate left lower extremity arterial disease. Unable to obtain TBI due to low amplitude waveforms.  *See table(s) above for measurements and observations.  Electronically signed by Jamelle Haring on 02/08/2021 at 6:23:35 PM.    Final     Principal Problem:   Sepsis due to cellulitis [facility]) Active Problems:   Mixed hyperlipidemia   Non-ischemic cardiomyopathy ([place])   Permanent atrial fibrillation   COPD (chronic obstructive pulmonary disease) ([place])   Chronic kidney disease, stage 3b ([place])   ICD (implantable cardioverter-defibrillator) in place   Hypotension    Tye Savoy, NP-C @  02/09/2021, 11:00 AM

## 2021-02-09 NOTE — Consult Note (Addendum)
Referring Provider:  [facility]         Primary Care Physician:  Lesleigh Noe, MD Primary Gastroenterologist:   Oretha Caprice, MD  (hospital consult Oct 2020)     Reason for Consultation:   black stool, anemia                ASSESSMENT / PLAN   # 87 year old female with recurrent acute on chronic anemia and black stool (on iron) but heme positive. She takes Eliquis. 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 Baseline hgb mid 10 to mid 11 range currently at 9.1. Eliquis on hold since admission 02/08/21.  --Probably recurrent small bowel AVM bleed  --Will need EGD / small bowel enteroscopy at some point, ? Possibly tomorrow. She had solids for breakfast today. She is scheduled for an angiogram with Vascular Surgery on Monday.  --Continue BID PPI --Trend hgb  # History of ischemic colitis in 2020. At that time she was apparently having mild rectal bleeding. Recurrent ischemic colitis unlikely in absence of rectal bleeding, leukocytosis or abdominal pain  # Thrombocytopenia, new this admission. Secondary to sepsis most likely.   # Sepsis from LLE cellulitis. Non-healing wound on left foot where skin cancer was recently removed. On antibiotics. She has known PAD, see below  # PAD. Vascular Surgery planning angiogram on Monday after Eliquis washout.   # A-Fib, home Eliquis is on hold.      Attending Physician Note   I have taken a history, reviewed the chart and examined the patient. I personally saw the patient and performed a substantive portion of this encounter, including a complete performance of at least one of the key components, in conjunction with the APP. I agree with the APP's note, impression and recommendations.   Impression: *Worsening anemia with dark heme positive stool. 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 Suspected bleeding from AVMs. R/O ulcer and other causes.   *LLE cellulitis with sepsis on  antibiotics  *PAD with plans for angiogram on Monday.   Recommendation: *Continue to hold Eliquis. Hold iron. PPI IV bid. Trend CBC. Schedule SBE with Dr. Benson Norway tomorrow.   *Drs. Mann & Cusick covering for the weekend.   Lucio Edward, MD [facility] See AM[facility], [facility], for our on call provider      HISTORY OF PRESENT ILLNESS                                                                                                                         Chief Complaint: anemia  [name] is a 87 y.o. female with a past medical history significant for A-Fib, ischemic colitis ( 2020),  cardiomyopathy s/p ICD, COPD, hypothyroidism, TIA, CKD 3, renal cell cancer s/p right nephrectomy, peripheral artery disease , cholecystectomy, diverticulosis. See PMH for any additional medical history.   We saw patient in the hospital October 2020 for evaluation  of acute on chronic anemia on coumadin.  Inpatient EGD showed mild gastritis and ischemic colitis on colonoscopy with biopsies. During that admission she continued to require blood transfusions for symptomatic IDA so had small bowel video capsule which showed a small AVM in duodenum and small streak of blood beyond that but no active bleeding. She was treated with IV iron  Patient readmitted with recurrent November 2021 with acute on chronic anemia and melena in setting of supratherapeutic INR. Eventually had push enteroscopy  with findings of two jejunal angiectasias , one actively bleeding treated with APC.   ED course:  Patient present to ED on 10/6 with fever. She has left foot pain where squamous cell cancer had recently been removed.  She also reported black stools and was scheduled for an appointment to see [place] in the office on the 18th.  She takes Eliquis.  She was admitted with GI bleed , sepsis secondary to cellulitis of her left foot,   and also being treated for COPD exacerbation . Vascular surgery was consulted for nonhealing wound in setting of  known peripheral artery disease.  Plan is for angiogram on Monday after Eliquis washout.   Baseline hgb in mid 10  to mid 11 range, it has declined this admission to 8.7 this am. Repeat CBC at 11 am showed hgb of 9.1. She has not required transfusion this admission. Patient takes oral iron but really isn't sure if it causes her stools to be dark but that have been black for a few days. Stools vary in consistency from formed to loose. She says her PCP has made her a follow up with [place] due to declining hgb. She has no abdominal pain but endorses nausea without vomiting of the last week. No NSAID use.     PREVIOUS ENDOSCOPIC EVALUATIONS   Oct 2020 EGD and colonoscopy for iron deficiency anemia --Gastritis --Patchy, moderate inflammation in the sigmoid colon. I have most concern for ischemic, possibly chronic. Two sites were biopsied. These findings could possibly explain acute on chronic, heme + anemia.  A. COLON, PROXIMAL SIGMOID, BIOPSY:  Colitis with surface exudate consistent with ischemic colitis.  No granulomas or malignancy.   B. COLON, DISTAL SIGMOID, BIOPSY:  Colitis with focal necrosis consistent with ischemic colitis.  No granulomas or malignancy.   C. STOMACH, ANTRUM AND BODY, BIOPSY:  Antral and corpus mucosa with slight chronic inflammation  Warthin-Starry negative for Helicobacter pylori.  No intestinal metaplasia, dysplasia or malignancy.    November 2020 video capsule study --Complete study, adequate prep.  No active bleeding.  1 AVM at 2 hours and 51 minutes.  Small streak of heme at 3 hours and 6 minutes  03/30/20 small bowel enteroscopy --normal esophagus --a few gastric polyps. - Normal mucosa was found in the entire stomach. - A single non-bleeding angioectasia in the duodenum. Treated with argon plasma coagulation (APC). - Two angioectasias in the jejunum, one which was actively bleeding. Treated with argon plasma coagulation (APC). - No specimens  collected.  Past Medical History:  Diagnosis Date   Actinic keratosis 01/17/2015   R forearm   Adjustment disorder with anxiety    Anemia    Anginal pain ([place])    Arthritis    "some in my hands" (11/11/2012)   Atrial fibrillation (HCC)    Atypical mole 03/25/2018   L forearm - severe   Automatic implantable cardioverter-defibrillator in situ    Blood in stool    Carotid artery stenosis 09/2007   a.  09/2007: 60-79% bilateral (stable); b. 10/2008: 40-59% R 60-79%    Chest pain, unspecified    Chronic airway obstruction, not elsewhere classified    Chronic bronchitis ([place])    "get it some; not q year" (11/11/2012)   Chronic diastolic CHF (congestive heart failure) ([place]) 06/04/2013   Chronic kidney disease, unspecified    Coronary artery disease    non-obstructive by 2006 cath   Displaced fracture of left femoral neck ([place]) 10/09/2018   Frequent UTI    "get them a couple times/yr" (11/11/2012)   GI bleed 03/28/2020   Hemorrhage of rectum and anus    High cholesterol    History of blood transfusion 04/2011   "after hip OR" (11/11/2012)   Hx of renal cell cancer    Hypertension 05/20/2011   Hypertr obst cardiomyop    Hypotension, unspecified    cardiac cath 2006..nonobstructive CAD 30-40s lesions.Marland KitchenETT 1/09 nondiagnostic due to poor HR response..Right Renal Cancer 2003   Long term (current) use of anticoagulants    Malignant neoplasm of kidney, except pelvis    Osteoarthritis of right hip    Other and unspecified coagulation defects    PONV (postoperative nausea and vomiting)    RECTAL BLEEDING 10/13/2009   Qualifier: Diagnosis of  By: Chester Holstein NP, Paula     Renal cancer [facility]) 06/2001   Right   Secondary cardiomyopathy, unspecified    Sinus bradycardia    Squamous cell carcinoma of skin 01/17/2015   R lat wrist   Squamous cell carcinoma of skin 01/29/2018   R post upper leg - superficially invasive   Squamous cell carcinoma of skin 03/25/2018   L lat foot   Squamous cell  carcinoma of skin 11/09/2020   left lat foot - [facility] 01/01/21   Urge incontinence     Past Surgical History:  Procedure Laterality Date   ABDOMINAL HYSTERECTOMY  1975   for benign causes   APPENDECTOMY     BI-VENTRICULAR PACEMAKER UPGRADE  05/04/2010   BIOPSY  02/28/2019   Procedure: BIOPSY;  Surgeon: Milus Banister, MD;  Location: [place];  Service: Endoscopy;;   CARDIAC CATHETERIZATION  2006   CARDIOVERSION N/A 02/20/2018   Procedure: CARDIOVERSION;  Surgeon: Minna Merritts, MD;  Location: [facility] [facility];  Service: Cardiovascular;  Laterality: N/A;   CARDIOVERSION N/A 03/27/2018   Procedure: CARDIOVERSION;  Surgeon: Minna Merritts, MD;  Location: [facility];  Service: Cardiovascular;  Laterality: N/A;   CATARACT EXTRACTION W/ INTRAOCULAR LENS  IMPLANT, BILATERAL  01/2006-02-2006   CHOLECYSTECTOMY N/A 11/11/2012   Procedure: LAPAROSCOPIC CHOLECYSTECTOMY WITH INTRAOPERATIVE CHOLANGIOGRAM;  Surgeon: Imogene Burn. Georgette Dover, MD;  Location: [place];  Service: General;  Laterality: N/A;   COLONOSCOPY WITH PROPOFOL N/A 02/28/2019   Procedure: COLONOSCOPY WITH PROPOFOL;  Surgeon: Milus Banister, MD;  Location: [facility] ENDOSCOPY;  Service: Endoscopy;  Laterality: N/A;   ENTEROSCOPY N/A 03/30/2020   Procedure: ENTEROSCOPY;  Surgeon: Lavena Bullion, DO;  Location: [place];  Service: Gastroenterology;  Laterality: N/A;   EP IMPLANTABLE DEVICE N/A 02/21/2016   Procedure: ICD Generator Changeout;  Surgeon: Deboraha Sprang, MD;  Location: [place] [facility];  Service: Cardiovascular;  Laterality: N/A;   ESOPHAGOGASTRODUODENOSCOPY (EGD) WITH PROPOFOL N/A 02/28/2019   Procedure: ESOPHAGOGASTRODUODENOSCOPY (EGD) WITH PROPOFOL;  Surgeon: Milus Banister, MD;  Location: [facility] ENDOSCOPY;  Service: Endoscopy;  Laterality: N/A;   GIVENS CAPSULE STUDY N/A 03/15/2019   Procedure: GIVENS CAPSULE STUDY;  Surgeon: Thornton Park, MD;  Location: [place];  Service: Gastroenterology;  Laterality: N/A;   HOT  HEMOSTASIS N/A 03/30/2020   Procedure: HOT HEMOSTASIS (ARGON PLASMA COAGULATION/BICAP);  Surgeon: Lavena Bullion, DO;  Location: [facility] ENDOSCOPY;  Service: Gastroenterology;  Laterality: N/A;   INSERT / REPLACE / REMOVE PACEMAKER  05-01-11   02-28-05-/05-04-10-ICD-MEDTRONIC MAXIMAL DR   JOINT REPLACEMENT     LAPAROSCOPIC CHOLECYSTECTOMY  11/11/2012   LAPAROSCOPIC LYSIS OF ADHESIONS N/A 11/11/2012   Procedure: LAPAROSCOPIC LYSIS OF ADHESIONS;  Surgeon: Imogene Burn. Georgette Dover, MD;  Location: [place];  Service: General;  Laterality: N/A;   NEPHRECTOMY Right 06/2001    S/P RENAL CELL CANCER   PRESSURE SENSOR/CARDIOMEMS N/A 02/03/2019   Procedure: PRESSURE SENSOR/CARDIOMEMS;  Surgeon: Larey Dresser, MD;  Location: [place] [facility];  Service: Cardiovascular;  Laterality: N/A;   RIGHT HEART CATH N/A 11/09/2018   Procedure: RIGHT HEART CATH;  Surgeon: Larey Dresser, MD;  Location: [place] [facility];  Service: Cardiovascular;  Laterality: N/A;   RIGHT HEART CATH N/A 03/08/2019   Procedure: RIGHT HEART CATH;  Surgeon: Larey Dresser, MD;  Location: [place] [facility];  Service: Cardiovascular;  Laterality: N/A;   SUBMUCOSAL TATTOO INJECTION  02/28/2019   Procedure: SUBMUCOSAL TATTOO INJECTION;  Surgeon: Milus Banister, MD;  Location: [facility] ENDOSCOPY;  Service: Endoscopy;;   TOTAL HIP ARTHROPLASTY Right 05/03/2011   Procedure: TOTAL HIP ARTHROPLASTY ANTERIOR APPROACH;  Surgeon: Mcarthur Rossetti;  Location: [facility];  Service: Orthopedics;  Laterality: Right;  Removal of Cannulated Screws Right Hip, Right Direct Anterior Hip Replacement   TOTAL HIP ARTHROPLASTY Left 10/09/2018   Procedure: TOTAL HIP ARTHROPLASTY ANTERIOR APPROACH;  Surgeon: Rod Can, MD;  Location: [place];  Service: Orthopedics;  Laterality: Left;    Prior to Admission medications   Medication Sig Start Date End Date Taking? Authorizing Provider  acetaminophen (TYLENOL) 500 MG tablet Take 500-1,000 mg by mouth every 8 (eight) hours  as needed for mild pain or headache.    Yes [provider]  apixaban (ELIQUIS) 2.5 MG TABS tablet Take 1 tablet (2.5 mg total) by mouth 2 (two) times daily. Dispense starter pack Patient taking differently: Take 2.5 mg by mouth 2 (two) times daily. 05/17/20  Yes Larey Dresser, MD  cephALEXin (KEFLEX) 500 MG capsule Take 1 capsule (500 mg total) by mouth 2 (two) times daily. 02/08/21  Yes Ripley Fraise, MD  Cholecalciferol (VITAMIN D) 50 MCG (2000 UT) CAPS Take 4,000 Units by mouth daily.   Yes [provider]  colchicine 0.6 MG tablet Take 2 pills followed by 1 pill 1 hour later. Then if no improvement in 2 days take 1 pill daily for up to 3 days. Patient taking differently: Take 0.6 mg by mouth daily. 01/30/21  Yes Lesleigh Noe, MD  Cranberry (AZO CRANBERRY GUMMIES) 500 MG CHEW Chew 2,000 mg by mouth daily.   Yes [provider]  Cyanocobalamin (B-12 PO) Take 1 tablet by mouth daily.    Yes [provider]  doxycycline (VIBRAMYCIN) 100 MG capsule Take 1 capsule (100 mg total) by mouth 2 (two) times daily. One po bid x 7 days 02/08/21  Yes Ripley Fraise, MD  ezetimibe (ZETIA) 10 MG tablet Take 1 tablet (10 mg total) by mouth daily. 06/21/20  Yes Milford, Maricela Bo, FNP  ferrous sulfate 325 (65 FE) MG tablet Take 1 tablet (325 mg total) by mouth 2 (two) times daily with a meal. 03/17/19  Yes Adhikari, Tamsen Meek, MD  fluticasone (FLONASE) 50 MCG/ACT nasal spray Place 2  sprays into both nostrils daily as needed for allergies or rhinitis. 07/27/19  Yes Elby Beck, FNP  levothyroxine (SYNTHROID) 25 MCG tablet TAKE 1 TABLET BY MOUTH ONCE DAILY BEFOREBREAKFAST Patient taking differently: Take 25 mcg by mouth daily before breakfast. 04/19/19  Yes Deboraha Sprang, MD  losartan (COZAAR) 25 MG tablet Take 0.5 tablets (12.5 mg total) by mouth daily. 12/19/20 03/19/21 Yes Milford, Maricela Bo, FNP  magnesium oxide (MAG-OX) 400 MG tablet Take 1 tablet (400 mg total) by  mouth daily. 06/05/18  Yes Deboraha Sprang, MD  metolazone (ZAROXOLYN) 2.5 MG tablet Take 2.5 mg by mouth daily as needed (For fluid).   Yes [provider]  mupirocin ointment (BACTROBAN) 2 % Apply 1 application topically daily. Qd to wound on foot Patient taking differently: Apply 1 application topically daily. 01/09/21  Yes Brendolyn Patty, MD  ondansetron (ZOFRAN) 4 MG tablet Take 1 tablet (4 mg total) by mouth every 8 (eight) hours as needed for nausea or vomiting. 04/17/20  Yes Elby Beck, FNP  pantoprazole (PROTONIX) 40 MG tablet TAKE 1 TABLET (40 MG TOTAL) BY MOUTH DAILY AT 6 (SIX) AM. Patient taking differently: Take 40 mg by mouth daily. 03/31/20 03/31/21 Yes Swayze, Ava, DO  potassium chloride SA (KLOR-CON M20) 20 MEQ tablet Take 2 tablets (40 mEq total) by mouth 2 (two) times daily. Take 1 extra tablet on the days you take metolazone. 06/29/20  Yes Larey Dresser, MD  rosuvastatin (CRESTOR) 40 MG tablet Take 1 tablet (40 mg total) by mouth daily. 11/11/18  Yes Larey Dresser, MD  torsemide (DEMADEX) 100 MG tablet TAKE 1 TABLET BY MOUTH EVERY MORNING (EVENING DOSE 80 MG) Patient taking differently: Take 100 mg by mouth daily. 01/05/21  Yes Milford, Maricela Bo, FNP  vitamin C (ASCORBIC ACID) 250 MG tablet Take 500 mg by mouth daily.   Yes [provider]  torsemide (DEMADEX) 20 MG tablet Take 4 tablets (80 mg total) by mouth every evening. To be taken with 100 mg tab in the AM Patient not taking: Reported on 02/08/2021 01/05/21 04/05/21  Rafael Bihari, FNP    Current Facility-Administered Medications  Medication Dose Route Frequency Provider Last Rate Last Admin   acetaminophen (TYLENOL) tablet 650 mg  650 mg Oral Q6H PRN Norval Morton, MD       Or   acetaminophen (TYLENOL) suppository 650 mg  650 mg Rectal Q6H PRN Fuller Plan A, MD       albuterol (PROVENTIL) (2.5 MG/3ML) 0.083% nebulizer solution 2.5 mg  2.5 mg Nebulization Q6H PRN Smith, Rondell A, MD        ceFEPIme (MAXIPIME) 2 g in sodium chloride 0.9 % 100 mL IVPB  2 g Intravenous Q24H Gareth [name], MD 200 mL/hr at 02/09/21 0952 2 g at 02/09/21 0952   fluticasone (FLONASE) 50 MCG/ACT nasal spray 2 spray  2 spray Each Nare Daily PRN Fuller Plan A, MD       lactated ringers infusion   Intravenous Continuous Chotiner, [name], MD 50 mL/hr at 02/09/21 0103 New Bag at 02/09/21 0103   levothyroxine (SYNTHROID) tablet 25 mcg  25 mcg Oral Q0600 Fuller Plan A, MD   25 mcg at 02/09/21 0607   lidocaine (LIDODERM) 5 % 2 patch  2 patch Transdermal Q24H Fuller Plan A, MD   2 patch at 02/08/21 1821   midodrine (PROAMATINE) tablet 5 mg  5 mg Oral TID WC Ghimire, Henreitta Leber, MD   5 mg  at 02/09/21 0938   ondansetron (ZOFRAN) tablet 4 mg  4 mg Oral Q6H PRN Fuller Plan A, MD       Or   ondansetron (ZOFRAN) injection 4 mg  4 mg Intravenous Q6H PRN Tamala Julian, Rondell A, MD       pantoprazole (PROTONIX) injection 40 mg  40 mg Intravenous Q12H Ghimire, Henreitta Leber, MD       rosuvastatin (CRESTOR) tablet 40 mg  40 mg Oral Daily Smith, Rondell A, MD   40 mg at 02/09/21 7858   sodium chloride flush (NS) 0.9 % injection 3 mL  3 mL Intravenous Q12H Smith, Rondell A, MD   3 mL at 02/09/21 0939   traMADol (ULTRAM) tablet 50 mg  50 mg Oral Q6H PRN Norval Morton, MD       traMADol (ULTRAM) tablet 50 mg  50 mg Oral Once Heloise Purpura, [facility]       [START ON 02/10/2021] vancomycin (VANCOREADY) IVPB 1250 mg/250 mL  1,250 mg Intravenous Q48H Smith, Rondell A, MD        Allergies as of 02/08/2021 - Review Complete 02/08/2021  Allergen Reaction Noted   Dilaudid [hydromorphone] Other (See Comments) 11/06/2018   Fentanyl Other (See Comments) 02/05/2019   Codeine Nausea And Vomiting    Morphine and related Nausea And Vomiting 11/04/2012   Whole blood Itching and Nausea And Vomiting 02/08/2021   Sulfonamide derivatives Other (See Comments)     Family History  Problem Relation Age of Onset   Heart failure Mother     Early death Father        car accident   Breast cancer Maternal Aunt 82   Breast cancer Cousin    Breast cancer Other    Colon cancer Neg Hx     Social History   Socioeconomic History   Marital status: Widowed    Spouse name: Not on file   Number of children: 2   Years of education: high school   Highest education level: Not on file  Occupational History   Occupation: Retired    Fish farm manager: RETIRED  Tobacco Use   Smoking status: Former    Packs/day: 0.50    Years: 40.00    Pack years: 20.00    Types: Cigarettes    Quit date: 05/06/2001    Years since quitting: 19.7   Smokeless tobacco: Never  Vaping Use   Vaping Use: Never used  Substance and Sexual Activity   Alcohol use: No    Alcohol/week: 0.0 standard drinks   Drug use: No   Sexual activity: Not Currently  Other Topics Concern   Not on file  Social History Narrative   Would desire CPR   07/11/20   From: the area   Living: with great-grandson - [name] (2003)   Work: retired - clerical      Family: 2 children - [name] and [place] - 2 grand chlidren - 5 great grand children      Enjoys: spending time with friends - watch move, eat out, spend time      Exercise: walking around the house   Diet: not good, limits red meat, limits salt, low appetite      Safety   Seat belts: Yes    Guns: Yes  and secure   Safe in relationships: Yes    Social Determinants of Health   Financial Resource Strain: Not on file  Food Insecurity: Not on file  Transportation Needs: Not on file  Physical Activity: Not  on file  Stress: Not on file  Social Connections: Not on file  Intimate Partner Violence: Not on file    Review of Systems: All systems reviewed and negative except where noted in HPI.  OBJECTIVE    Physical Exam: Vital signs in last 24 hours: Temp:  [98 F (36.7 C)-98.6 F (37 C)] 98.3 F (36.8 C) (10/07 0220) Pulse Rate:  [60-101] 60 (10/07 0220) Resp:  [17-29] 19 (10/07 0220) BP: (72-138)/(34-81) 100/55  (10/07 0220) SpO2:  [93 %-100 %] 97 % (10/07 0220) Weight:  [64.2 kg] 64.2 kg (10/06 2340)   General:   Alert  female in NAD Psych:  Pleasant, cooperative. Normal mood and affect. Eyes:  Pupils equal, sclera clear, no icterus.   Conjunctiva pink. Ears:  Normal auditory acuity. Nose:  No deformity, discharge,  or lesions. Neck:  Supple; no masses Lungs:  Clear throughout to auscultation.   No wheezes, crackles, or rhonchi.  Heart:  Regular rate and rhythm;  loud murmur.  Abdomen:  Soft, non-distended, nontender, BS active, no palp mass   Rectal:  Deferred  Msk:  Symmetrical without gross deformities. Neurologic:  Alert and  oriented x4;  grossly normal neurologically. Skin:  LLE foot / leg up to mid tibia with erythema and edema. Non-draining round wound (slightly small than a quarter) on lateral aspect of left foot   Scheduled inpatient medications  levothyroxine  25 mcg Oral Q0600   lidocaine  2 patch Transdermal Q24H   midodrine  5 mg Oral TID WC   pantoprazole (PROTONIX) IV  40 mg Intravenous Q12H   rosuvastatin  40 mg Oral Daily   sodium chloride flush  3 mL Intravenous Q12H   traMADol  50 mg Oral Once      Intake/Output from previous day: 10/06 0701 - 10/07 0700 In: 1256.3 [I.V.:500.7; IV Piggyback:755.7] Out: 1 [Urine:1] Intake/Output this shift: No intake/output data recorded.   Lab Results: Recent Labs    02/08/21 0156 02/09/21 0207  WBC 9.1 7.2  HGB 10.2* 8.7*  HCT 32.3* 27.8*  PLT 172 130*   BMET Recent Labs    02/08/21 0156 02/09/21 0207  NA 135 131*  K 3.9 3.7  CL 96* 97*  CO2 27 24  GLUCOSE 124* 103*  BUN 39* 48*  CREATININE 1.63* 1.89*  CALCIUM 9.5 8.1*   LFT Recent Labs    02/08/21 0156  PROT 6.6  ALBUMIN 4.0  AST 18  ALT 15  ALKPHOS 46  BILITOT 0.9   PT/INR No results for input(s): LABPROT, INR in the last 72 hours. Hepatitis Panel No results for input(s): HEPBSAG, HCVAB, HEPAIGM, HEPBIGM in the last 72 hours.   . CBC  Latest Ref Rng & Units 02/09/2021 02/08/2021 01/16/2021  WBC 4.0 - 10.5 K/uL 7.2 9.1 3.3(L)  Hemoglobin 12.0 - 15.0 g/dL 8.7(L) 10.2(L) 10.5(L)  Hematocrit 36.0 - 46.0 % 27.8(L) 32.3(L) 32.0(L)  Platelets 150 - 400 K/uL 130(L) 172 190.0    . CMP Latest Ref Rng & Units 02/09/2021 02/08/2021 01/30/2021  Glucose 70 - 99 mg/dL 103(H) 124(H) 92  BUN 8 - 23 mg/dL 48(H) 39(H) 36(H)  Creatinine 0.44 - 1.00 mg/dL 1.89(H) 1.63(H) 1.44(H)  Sodium 135 - 145 mmol/L 131(L) 135 139  Potassium 3.5 - 5.1 mmol/L 3.7 3.9 4.4  Chloride 98 - 111 mmol/L 97(L) 96(L) 101  CO2 22 - 32 mmol/L 24 27 28   Calcium 8.9 - 10.3 mg/dL 8.1(L) 9.5 9.5  Total Protein 6.5 - 8.1 g/dL - 6.6 -  Total Bilirubin 0.3 - 1.2 mg/dL - 0.9 -  Alkaline Phos 38 - 126 U/L - 46 -  AST 15 - 41 U/L - 18 -  ALT 0 - 44 U/L - 15 -   Studies/Results: DG Ankle Complete Left  Result Date: 02/08/2021 CLINICAL DATA:  Left lateral foot wound, left ankle pain. EXAM: LEFT ANKLE COMPLETE - 3+ VIEW COMPARISON:  Foot radiograph 11/10/2019 FINDINGS: Mild likely chronic periosteal reaction along the distal tibia medially, probably from chronic venous insufficiency. No fracture or acute bony findings. No bony destructive findings characteristic of osteomyelitis, I do note that the posterior portion of the calcaneus was excluded on the lateral projection and if this is a specific area of concern thin please notify the radiology department and we can attempt another lateral view. There is soft tissue swelling and subcutaneous edema along the lower calf and ankle. IMPRESSION: 1. Diffuse subcutaneous edema. 2. No bony destructive findings characteristic of osteomyelitis in the ankle region. Please note the posterior calcaneus is excluded on the lateral projection. 3. Likely chronic periosteal reaction along the distal tibia, most likely from chronic venous insufficiency. Electronically Signed   By: Van Clines M.D.   On: 02/08/2021 12:10   DG Chest Port 1  View  Result Date: 02/08/2021 CLINICAL DATA:  87 year old female with history of fever. EXAM: PORTABLE CHEST 1 VIEW COMPARISON:  Chest x-ray 05/09/2020. FINDINGS: Lung volumes are normal. No consolidative airspace disease. No pleural effusions. No pneumothorax. Multiple small calcified granulomas are noted throughout the lungs bilaterally. No other definite suspicious appearing pulmonary nodules or masses are noted. No evidence of pulmonary edema. Heart size is mildly enlarged. Upper mediastinal contours are within normal limits. Atherosclerotic calcifications are noted in the thoracic aorta. Left-sided pacemaker/AICD with lead tips projecting over the expected location of the right atrium and right ventricle. IMPRESSION: 1. No radiographic evidence of acute cardiopulmonary disease. 2. Aortic atherosclerosis. 3. Mild cardiomegaly. Electronically Signed   By: Vinnie Langton M.D.   On: 02/08/2021 05:06   VAS [place] ABI WITH/WO TBI  Result Date: 02/08/2021  LOWER EXTREMITY DOPPLER STUDY Patient Name:  SENG FOUTS  Date of Exam:   02/08/2021 Medical Rec #: [id number]          Accession #:    [id number] Date of Birth: 05-28-1933          Patient Gender: F Patient Age:   48 years Exam Location:  [facility] Procedure:      VAS [place] ABI WITH/WO TBI Referring Phys: Gareth [name] --------------------------------------------------------------------------------  Indications: Ulceration. High Risk Factors: Hypertension, hyperlipidemia.  Limitations: Today's exam was limited due to patient intolerant to cuff              pressure. Comparison Study: 03/03/2019 - Unable to get pressures. Waveforms and toe                   pressures are abnormal                    Summary:                   Right: The right toe-brachial index is abnormal.                    Unable to get pressures. Waveforms and toe pressures are                   abnormal. Performing Technologist: Carlos Levering RVT  Examination Guidelines: A  complete evaluation includes at minimum, Doppler waveform signals and systolic blood pressure reading at the level of bilateral brachial, anterior tibial, and posterior tibial arteries, when vessel segments are accessible. Bilateral testing is considered an integral part of a complete examination. Photoelectric Plethysmograph (PPG) waveforms and toe systolic pressure readings are included as required and additional duplex testing as needed. Limited examinations for reoccurring indications may be performed as noted.  ABI Findings: +--------+------------------+-----+----------+--------+ Right   Rt Pressure (mmHg)IndexWaveform  Comment  +--------+------------------+-----+----------+--------+ [id number]                    triphasic          +--------+------------------+-----+----------+--------+ PTA     84                0.67 monophasic         +--------+------------------+-----+----------+--------+ DP      61                0.49 monophasic         +--------+------------------+-----+----------+--------+ +--------+------------------+-----+----------+-------+ Left    Lt Pressure (mmHg)IndexWaveform  Comment +--------+------------------+-----+----------+-------+ [id number]                    triphasic         +--------+------------------+-----+----------+-------+ PTA     64                0.51 monophasic        +--------+------------------+-----+----------+-------+ DP      60                0.48 monophasic        +--------+------------------+-----+----------+-------+ +-------+-----------+-----------+------------+------------+ ABI/TBIToday's ABIToday's TBIPrevious ABIPrevious TBI +-------+-----------+-----------+------------+------------+ Right  0.67                                           +-------+-----------+-----------+------------+------------+ Left   0.51                                            +-------+-----------+-----------+------------+------------+  Summary: Right: Resting right ankle-brachial index indicates moderate right lower extremity arterial disease. Unable to obtain TBI due to low amplitude waveforms. Left: Resting left ankle-brachial index indicates moderate left lower extremity arterial disease. Unable to obtain TBI due to low amplitude waveforms.  *See table(s) above for measurements and observations.  Electronically signed by Jamelle Haring on 02/08/2021 at 6:23:35 PM.    Final     Principal Problem:   Sepsis due to cellulitis [facility]) Active Problems:   Mixed hyperlipidemia   Non-ischemic cardiomyopathy ([place])   Permanent atrial fibrillation   COPD (chronic obstructive pulmonary disease) ([place])   Chronic kidney disease, stage 3b ([place])   ICD (implantable cardioverter-defibrillator) in place   Hypotension    Tye Savoy, NP-C @  02/09/2021, 11:00 AM

## 2021-02-09 NOTE — Progress Notes (Signed)
Pt blood pressure dropped with MAP <65. Pt alert and oriented x4. Notified CN. MD paged  and new orders were given. Will continue to monitor pt.

## 2021-02-09 NOTE — Progress Notes (Signed)
[facility] [place] PROGRESS NOTE  ASSESSMENT / PLAN: [name] is a 87 y.o. female with atherosclerosis of native arteries of bilateral lower extremities causing  poorly healing biopsy site of left foot  with mild cellulitis.  Plan left lower extremity angiogram with possible intervention 02/12/21. Diminished R femoral pulse. Absent L femoral pulse. May need radial access for diagnostic pictures. Not able to check CT angiogram prior given renal function.  SUBJECTIVE: Feels a bit better today. Still somnolent and dozes during our conversation.  OBJECTIVE: BP (!) 100/55 (BP Location: Right Arm)   Pulse 60   Temp 98.3 F (36.8 C) (Oral)   Resp 19   Ht 5' 6.5" (1.689 m)   Wt 64.2 kg   SpO2 97%   BMI 22.50 kg/m   Intake/Output Summary (Last 24 hours) at 02/09/2021 0921 Last data filed at 02/09/2021 0132 Gross per 24 hour  Intake 756.33 ml  Output 1 ml  Net 755.33 ml    Constitutional: Elderly. Chronically ill. No acute distress. Cardiac: RRR. Pulmonary: unlabored Abdomen: soft Vascular: 1+ R femoral pulse. Absent L femoral pulse. Improved appearance of L calf.  CBC Latest Ref Rng & Units 02/09/2021 02/08/2021 01/16/2021  WBC 4.0 - 10.5 K/uL 7.2 9.1 3.3(L)  Hemoglobin 12.0 - 15.0 g/dL 8.7(L) 10.2(L) 10.5(L)  Hematocrit 36.0 - 46.0 % 27.8(L) 32.3(L) 32.0(L)  Platelets 150 - 400 K/uL 130(L) 172 190.0     CMP Latest Ref Rng & Units 02/09/2021 02/08/2021 01/30/2021  Glucose 70 - 99 mg/dL 103(H) 124(H) 92  BUN 8 - 23 mg/dL 48(H) 39(H) 36(H)  Creatinine 0.44 - 1.00 mg/dL 1.89(H) 1.63(H) 1.44(H)  Sodium 135 - 145 mmol/L 131(L) 135 139  Potassium 3.5 - 5.1 mmol/L 3.7 3.9 4.4  Chloride 98 - 111 mmol/L 97(L) 96(L) 101  CO2 22 - 32 mmol/L 24 27 28   Calcium 8.9 - 10.3 mg/dL 8.1(L) 9.5 9.5  Total Protein 6.5 - 8.1 g/dL - 6.6 -  Total Bilirubin 0.3 - 1.2 mg/dL - 0.9 -  Alkaline Phos 38 - 126 U/L - 46 -  AST 15 - 41 U/L - 18 -  ALT 0 - 44 U/L - 15 -     Estimated Creatinine Clearance: 21.2 mL/min (A) (by C-G formula based on SCr of 1.89 mg/dL (H)).  [name] Aline. Stanford Breed, MD [facility] Phone Number: [phone] 02/09/2021 9:21 AM

## 2021-02-09 NOTE — Progress Notes (Signed)
Pharmacy Antibiotic Note  [name] is a 87 y.o. female admitted on 02/08/2021 with SSTI and osteomyelitis .  Pharmacy has been consulted for vancomycin and cefepime dosing.  Plan: - Decrease vancomycin to 1000mg  IV q48h (eAUC 470, Cr 1.89mg /dL, Vd 0.72 L/kg) - Continue cefepime 2g IV q24h - Monitor renal function, clinical status, and antibiotic plan - Order vanc levels as necessary  Height: 5' 6.5" (168.9 cm) Weight: 64.2 kg (141 lb 8.6 oz) IBW/kg (Calculated) : 60.45  Temp (24hrs), Avg:98.3 F (36.8 C), Min:98 F (36.7 C), Max:98.6 F (37 C)  Recent Labs  Lab 02/08/21 0155 02/08/21 0156 02/08/21 0352 02/09/21 0207 02/09/21 1105  WBC  --  9.1  --  7.2 6.5  CREATININE  --  1.63*  --  1.89*  --   LATICACIDVEN 1.5  --  1.4  --   --     Estimated Creatinine Clearance: 21.2 mL/min (A) (by C-G formula based on SCr of 1.89 mg/dL (H)).    Allergies  Allergen Reactions   Dilaudid [Hydromorphone] Other (See Comments)    Excessive Somnolence- Required Narcan   Fentanyl Other (See Comments)    Required Narcan when given with Dilaudid. Tolerated single dose when given during procedure 02/03/2019.    Codeine Nausea And Vomiting    Hallucinations    Morphine And Related Nausea And Vomiting    Hallucinations    Whole Blood Itching and Nausea And Vomiting    Pt and family stated pt will receive blood products but had a reaction requiring benadryl on a previous administration.    Sulfonamide Derivatives Other (See Comments)    Dry mouth    Antimicrobials this admission: Vanc 10/6 >>  Cefepime 10/6 >>   Dose adjustments this admission: 10/07 Vancomycin 1250mg  Q48H > Vancomycin 1000mg  Q48H  Microbiology results: 10/6 BCx: ngtd 10/6 UCx: pending   Thank you for allowing pharmacy to be a part of this patient's care.  Ardyth Harps, PharmD Clinical Pharmacist

## 2021-02-09 NOTE — Progress Notes (Signed)
PROGRESS NOTE        PATIENT DETAILS Name: [name] Age: 87 y.o. Sex: female Date of Birth: 04-23-1934 Admit Date: 02/08/2021 Admitting Physician Norval Morton, MD NGE:XBMW, Jobe Marker, MD  Brief Narrative: Patient is a 87 y.o. female with history of A. 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  See below for further details.  Subjective: Lying comfortably in bed-denies any chest pain or shortness of breath.  She had a small BM that was black in color.  Objective: Vitals: Blood pressure (!) 100/55, pulse 60, temperature 98.3 F (36.8 C), temperature source Oral, resp. rate 19, height 5' 6.5" (1.689 m), weight 64.2 kg, SpO2 97 %.   Exam: Gen Exam:Alert awake-not in any distress HEENT:atraumatic, normocephalic Chest: B/L clear to auscultation anteriorly CVS:S1S2 regular Abdomen:soft non tender, non distended Extremities see picture below Neurology: Non focal Skin: no rash     Pertinent Labs/Radiology: WBC: 7.2 Hb: 8.7 Na: 131 K: 3.7 Creatinine: 1.89  10/6>>Blood culture: Pending 10/6>> x-ray left ankle: Diffuse edema-no osteomyelitis.  Assessment/Plan: Sepsis due to left leg cellulitis superimposed on underlying squamous cell carcinoma: Sepsis physiology slowly improving-continue empiric antibiotics-await cultures.  Vascular surgery planning angiogram on 10/10  Hypotension: BP slowly improving-still soft in the low 100s range-continue to hold all antihypertensives-starting low-dose midodrine.  Melena-likely slow GI bleeding-with acute on chronic blood loss anemia: Having melanotic stools-2 g drop in hemoglobin-suspect this is a slow GI bleed-do not think she is having any brisk  bleeding.  We will follow CBC-await GI input.  She has a history of having AVMs in the past.    PAD: Vascular surgery following-angiogram planned for 10/10-after Eliquis washout.  Acute hypoxic respiratory failure due to COPD exacerbation: Improved-minimal wheezing present today-continue bronchodilators.  Attempt to titrate off oxygen.  CKD stage IIIb: Creatinine slightly elevated-but not far from baseline.  Watch closely.  Chronic atrial fibrillation: Paced rhythm-Eliquis on hold  HFrEF: Euvolemic on exam-her EF has recovered-she has ICD in place.  HTN: BP soft but stable-continue to hold antihypertensives.  HLD: Continue statin  Hypothyroidism: Continue Synthroid  GERD: Continue PPI  Squamous cell carcinoma involving left leg ulcer: Stable for outpatient follow-up with dermatology  Procedures: None Consults: GI,  DVT Prophylaxis: SCD's Code Status:Full code  Family Communication: None at bedside  Time spent: 35 minutes-Greater than 50% of this time was spent in counseling, explanation of diagnosis, planning of further management, and coordination of care.  Diet: Diet Order             Diet Heart Room service appropriate? Yes; Fluid consistency: Thin  Diet effective now                      Disposition Plan: Status is: Inpatient  Remains inpatient appropriate because:Inpatient level of care appropriate due to severity of illness  Dispo: The patient is from: Home              Anticipated d/c is to: Home              Patient currently is not medically stable to d/c.   Difficult to place patient No     Barriers to  Discharge: Resolving sepsis physiology-possible GI bleeding-not yet stable for discharge.  Antimicrobial agents: Anti-infectives (From admission, onward)    Start     Dose/Rate Route Frequency Ordered Stop   02/10/21 1000  vancomycin (VANCOREADY) IVPB 1250 mg/250 mL        1,250 mg 166.7 mL/hr over 90 Minutes Intravenous Every 48 hours  02/08/21 1346     02/08/21 1130  ceFEPIme (MAXIPIME) 2 g in sodium chloride 0.9 % 100 mL IVPB        2 g 200 mL/hr over 30 Minutes Intravenous Every 24 hours 02/08/21 1112     02/08/21 1115  vancomycin (VANCOREADY) IVPB 1250 mg/250 mL        1,250 mg 166.7 mL/hr over 90 Minutes Intravenous  Once 02/08/21 1112 02/08/21 1509   02/08/21 0000  cephALEXin (KEFLEX) 500 MG capsule        500 mg Oral 2 times daily 02/08/21 0719     02/08/21 0000  doxycycline (VIBRAMYCIN) 100 MG capsule        100 mg Oral 2 times daily 02/08/21 0719          MEDICATIONS: Scheduled Meds:  levothyroxine  25 mcg Oral Q0600   lidocaine  2 patch Transdermal Q24H   midodrine  5 mg Oral TID WC   pantoprazole  40 mg Oral Daily   rosuvastatin  40 mg Oral Daily   sodium chloride flush  3 mL Intravenous Q12H   traMADol  50 mg Oral Once   Continuous Infusions:  ceFEPime (MAXIPIME) IV 2 g (02/09/21 0952)   lactated ringers 50 mL/hr at 02/09/21 0103   [START ON 02/10/2021] vancomycin     PRN Meds:.acetaminophen **OR** acetaminophen, albuterol, fluticasone, ondansetron **OR** ondansetron (ZOFRAN) IV, traMADol   I have personally reviewed following labs and imaging studies  LABORATORY DATA: CBC: Recent Labs  Lab 02/08/21 0156 02/09/21 0207  WBC 9.1 7.2  NEUTROABS 8.0*  --   HGB 10.2* 8.7*  HCT 32.3* 27.8*  MCV 91.8 92.4  PLT 172 130*    Basic Metabolic Panel: Recent Labs  Lab 02/08/21 0156 02/09/21 0207  NA 135 131*  K 3.9 3.7  CL 96* 97*  CO2 27 24  GLUCOSE 124* 103*  BUN 39* 48*  CREATININE 1.63* 1.89*  CALCIUM 9.5 8.1*    GFR: Estimated Creatinine Clearance: 21.2 mL/min (A) (by C-G formula based on SCr of 1.89 mg/dL (H)).  Liver Function Tests: Recent Labs  Lab 02/08/21 0156  AST 18  ALT 15  ALKPHOS 46  BILITOT 0.9  PROT 6.6  ALBUMIN 4.0   No results for input(s): LIPASE, AMYLASE in the last 168 hours. No results for input(s): AMMONIA in the last 168 hours.  Coagulation  Profile: No results for input(s): INR, PROTIME in the last 168 hours.  Cardiac Enzymes: No results for input(s): CKTOTAL, CKMB, CKMBINDEX, TROPONINI in the last 168 hours.  BNP (last 3 results) No results for input(s): PROBNP in the last 8760 hours.  Lipid Profile: Recent Labs    02/09/21 0207  CHOL 101  HDL 28*  LDLCALC 58  TRIG 74  CHOLHDL 3.6    Thyroid Function Tests: No results for input(s): TSH, T4TOTAL, FREET4, T3FREE, THYROIDAB in the last 72 hours.  Anemia Panel: No results for input(s): VITAMINB12, FOLATE, FERRITIN, TIBC, IRON, RETICCTPCT in the last 72 hours.  Urine analysis:    Component Value Date/Time   COLORURINE YELLOW 02/08/2021 0942   APPEARANCEUR CLEAR 02/08/2021 0942   APPEARANCEUR Cloudy (  A) 01/27/2018 1330   LABSPEC 1.018 02/08/2021 0942   PHURINE 5.0 02/08/2021 0942   GLUCOSEU NEGATIVE 02/08/2021 0942   HGBUR NEGATIVE 02/08/2021 0942   HGBUR large 11/16/2009 1202   BILIRUBINUR NEGATIVE 02/08/2021 0942   BILIRUBINUR neg 05/02/2020 0911   BILIRUBINUR Negative 01/27/2018 1330   KETONESUR NEGATIVE 02/08/2021 0942   PROTEINUR NEGATIVE 02/08/2021 0942   UROBILINOGEN 0.2 05/02/2020 0911   UROBILINOGEN 1.0 08/16/2014 1754   NITRITE NEGATIVE 02/08/2021 0942   LEUKOCYTESUR LARGE (A) 02/08/2021 0942    Sepsis Labs: Lactic Acid, Venous    Component Value Date/Time   LATICACIDVEN 1.4 02/08/2021 0352    MICROBIOLOGY: Recent Results (from the past 240 hour(s))  Resp Panel by RT-PCR (Flu A&B, Covid) Nasopharyngeal Swab     Status: None   Collection Time: 02/08/21  4:46 AM   Specimen: Nasopharyngeal Swab; Nasopharyngeal(NP) swabs in vial transport medium  Result Value Ref Range Status   SARS Coronavirus 2 by RT PCR NEGATIVE NEGATIVE Final    Comment: (NOTE) SARS-CoV-2 target nucleic acids are NOT DETECTED.  The SARS-CoV-2 RNA is generally detectable in upper respiratory specimens during the acute phase of infection. The lowest concentration of  SARS-CoV-2 viral copies this assay can detect is 138 copies/mL. A negative result does not preclude SARS-Cov-2 infection and should not be used as the sole basis for treatment or other patient management decisions. A negative result may occur with  improper specimen collection/handling, submission of specimen other than nasopharyngeal swab, presence of viral mutation(s) within the areas targeted by this assay, and inadequate number of viral copies(<138 copies/mL). A negative result must be combined with clinical observations, patient history, and epidemiological information. The expected result is Negative.  Fact Sheet for Patients:  EntrepreneurPulse.com.au  Fact Sheet for Healthcare Providers:  IncredibleEmployment.be  This test is no t yet approved or cleared by the [place] FDA and  has been authorized for detection and/or diagnosis of SARS-CoV-2 by FDA under an Emergency Use Authorization (EUA). This EUA will remain  in effect (meaning this test can be used) for the duration of the COVID-19 declaration under Section 564(b)(1) of the Act, 21 U.S.C.section 360bbb-3(b)(1), unless the authorization is terminated  or revoked sooner.       Influenza A by PCR NEGATIVE NEGATIVE Final   Influenza B by PCR NEGATIVE NEGATIVE Final    Comment: (NOTE) The Xpert Xpress SARS-CoV-2/FLU/RSV plus assay is intended as an aid in the diagnosis of influenza from Nasopharyngeal swab specimens and should not be used as a sole basis for treatment. Nasal washings and aspirates are unacceptable for Xpert Xpress SARS-CoV-2/FLU/RSV testing.  Fact Sheet for Patients: EntrepreneurPulse.com.au  Fact Sheet for Healthcare Providers: IncredibleEmployment.be  This test is not yet approved or cleared by the [place] FDA and has been authorized for detection and/or diagnosis of SARS-CoV-2 by FDA under an Emergency Use  Authorization (EUA). This EUA will remain in effect (meaning this test can be used) for the duration of the COVID-19 declaration under Section 564(b)(1) of the Act, 21 U.S.C. section 360bbb-3(b)(1), unless the authorization is terminated or revoked.  Performed at [facility], [place] [address], [place], [place] [address]     RADIOLOGY STUDIES/RESULTS: DG Ankle Complete Left  Result Date: 02/08/2021 CLINICAL DATA:  Left lateral foot wound, left ankle pain. EXAM: LEFT ANKLE COMPLETE - 3+ VIEW COMPARISON:  Foot radiograph 11/10/2019 FINDINGS: Mild likely chronic periosteal reaction along the distal tibia medially, probably from chronic venous insufficiency. No fracture or  acute bony findings. No bony destructive findings characteristic of osteomyelitis, I do note that the posterior portion of the calcaneus was excluded on the lateral projection and if this is a specific area of concern thin please notify the radiology department and we can attempt another lateral view. There is soft tissue swelling and subcutaneous edema along the lower calf and ankle. IMPRESSION: 1. Diffuse subcutaneous edema. 2. No bony destructive findings characteristic of osteomyelitis in the ankle region. Please note the posterior calcaneus is excluded on the lateral projection. 3. Likely chronic periosteal reaction along the distal tibia, most likely from chronic venous insufficiency. Electronically Signed   By: Van Clines M.D.   On: 02/08/2021 12:10   DG Chest Port 1 View  Result Date: 02/08/2021 CLINICAL DATA:  87 year old female with history of fever. EXAM: PORTABLE CHEST 1 VIEW COMPARISON:  Chest x-ray 05/09/2020. FINDINGS: Lung volumes are normal. No consolidative airspace disease. No pleural effusions. No pneumothorax. Multiple small calcified granulomas are noted throughout the lungs bilaterally. No other definite suspicious appearing pulmonary nodules or masses are noted. No evidence of pulmonary edema.  Heart size is mildly enlarged. Upper mediastinal contours are within normal limits. Atherosclerotic calcifications are noted in the thoracic aorta. Left-sided pacemaker/AICD with lead tips projecting over the expected location of the right atrium and right ventricle. IMPRESSION: 1. No radiographic evidence of acute cardiopulmonary disease. 2. Aortic atherosclerosis. 3. Mild cardiomegaly. Electronically Signed   By: Vinnie Langton M.D.   On: 02/08/2021 05:06   VAS [place] ABI WITH/WO TBI  Result Date: 02/08/2021  LOWER EXTREMITY DOPPLER STUDY Patient Name:  ROSEANNA KOPLIN  Date of Exam:   02/08/2021 Medical Rec #: [id number]          Accession #:    [id number] Date of Birth: [birth year]          Patient Gender: F Patient Age:   74 years Exam Location:  [facility] Procedure:      VAS [place] ABI WITH/WO TBI Referring Phys: Gareth [name] --------------------------------------------------------------------------------  Indications: Ulceration. High Risk Factors: Hypertension, hyperlipidemia.  Limitations: Today's exam was limited due to patient intolerant to cuff              pressure. Comparison Study: 03/03/2019 - Unable to get pressures. Waveforms and toe                   pressures are abnormal                    Summary:                   Right: The right toe-brachial index is abnormal.                    Unable to get pressures. Waveforms and toe pressures are                   abnormal. Performing Technologist: Carlos Levering RVT  Examination Guidelines: A complete evaluation includes at minimum, Doppler waveform signals and systolic blood pressure reading at the level of bilateral brachial, anterior tibial, and posterior tibial arteries, when vessel segments are accessible. Bilateral testing is considered an integral part of a complete examination. Photoelectric Plethysmograph (PPG) waveforms and toe systolic pressure readings are included as required and additional duplex testing as needed. Limited  examinations for reoccurring indications may be performed as noted.  ABI Findings: +--------+------------------+-----+----------+--------+ Right   Rt Pressure (mmHg)IndexWaveform  Comment  +--------+------------------+-----+----------+--------+  [phone]                    triphasic          +--------+------------------+-----+----------+--------+ PTA     84                0.67 monophasic         +--------+------------------+-----+----------+--------+ DP      61                0.49 monophasic         +--------+------------------+-----+----------+--------+ +--------+------------------+-----+----------+-------+ Left    Lt Pressure (mmHg)IndexWaveform  Comment +--------+------------------+-----+----------+-------+ [id number]                    triphasic         +--------+------------------+-----+----------+-------+ PTA     64                0.51 monophasic        +--------+------------------+-----+----------+-------+ DP      60                0.48 monophasic        +--------+------------------+-----+----------+-------+ +-------+-----------+-----------+------------+------------+ ABI/TBIToday's ABIToday's TBIPrevious ABIPrevious TBI +-------+-----------+-----------+------------+------------+ Right  0.67                                           +-------+-----------+-----------+------------+------------+ Left   0.51                                           +-------+-----------+-----------+------------+------------+  Summary: Right: Resting right ankle-brachial index indicates moderate right lower extremity arterial disease. Unable to obtain TBI due to low amplitude waveforms. Left: Resting left ankle-brachial index indicates moderate left lower extremity arterial disease. Unable to obtain TBI due to low amplitude waveforms.  *See table(s) above for measurements and observations.  Electronically signed by Jamelle Haring on 02/08/2021 at 6:23:35 PM.    Final       LOS: 1 day   Oren Binet, MD  [facility]    To contact the attending provider between 7A-7P or the covering provider during after hours 7P-7A, please log into the web site [email or web address] and access using universal [place] password for that web site. If you do not have the password, please call the hospital operator.  02/09/2021, 10:17 AM

## 2021-02-10 ENCOUNTER — Encounter (HOSPITAL_COMMUNITY)
Admission: EM | Disposition: A | Discharge status: Home Health | Payer: Self-pay | Source: Home / Self Care | Attending: Internal Medicine

## 2021-02-10 ENCOUNTER — Encounter (HOSPITAL_COMMUNITY): Payer: Self-pay | Admitting: Internal Medicine

## 2021-02-10 ENCOUNTER — Inpatient Hospital Stay (HOSPITAL_COMMUNITY): Payer: PPO | Admitting: Certified Registered Nurse Anesthetist

## 2021-02-10 HISTORY — PX: HOT HEMOSTASIS: SHX5433

## 2021-02-10 HISTORY — PX: ENTEROSCOPY: SHX5533

## 2021-02-10 LAB — CBC
HCT: 27.6 % — ABNORMAL LOW (ref 36.0–46.0)
HCT: 30.1 % — ABNORMAL LOW (ref 36.0–46.0)
HCT: 28.6 % — ABNORMAL LOW (ref 36.0–46.0)
Hemoglobin: 8.6 g/dL — ABNORMAL LOW (ref 12.0–15.0)
Hemoglobin: 9.5 g/dL — ABNORMAL LOW (ref 12.0–15.0)
Hemoglobin: 8.8 g/dL — ABNORMAL LOW (ref 12.0–15.0)
MCH: 28.5 pg (ref 26.0–34.0)
MCH: 28.8 pg (ref 26.0–34.0)
MCH: 28.2 pg (ref 26.0–34.0)
MCHC: 31.2 g/dL (ref 30.0–36.0)
MCHC: 31.6 g/dL (ref 30.0–36.0)
MCHC: 30.8 g/dL (ref 30.0–36.0)
MCV: 91.4 fL (ref 80.0–100.0)
MCV: 91.2 fL (ref 80.0–100.0)
MCV: 91.7 fL (ref 80.0–100.0)
Platelets: 129 10*3/uL — ABNORMAL LOW (ref 150–400)
Platelets: 127 10*3/uL — ABNORMAL LOW (ref 150–400)
Platelets: 137 10*3/uL — ABNORMAL LOW (ref 150–400)
RBC: 3.02 MIL/uL — ABNORMAL LOW (ref 3.87–5.11)
RBC: 3.3 MIL/uL — ABNORMAL LOW (ref 3.87–5.11)
RBC: 3.12 MIL/uL — ABNORMAL LOW (ref 3.87–5.11)
RDW: 16.5 % — ABNORMAL HIGH (ref 11.5–15.5)
RDW: 16.3 % — ABNORMAL HIGH (ref 11.5–15.5)
RDW: 16.1 % — ABNORMAL HIGH (ref 11.5–15.5)
WBC: 5.7 10*3/uL (ref 4.0–10.5)
WBC: 4.7 10*3/uL (ref 4.0–10.5)
WBC: 3.6 10*3/uL — ABNORMAL LOW (ref 4.0–10.5)
nRBC: 0 % (ref 0.0–0.2)
nRBC: 0 % (ref 0.0–0.2)
nRBC: 0 % (ref 0.0–0.2)

## 2021-02-10 LAB — PROCALCITONIN: Procalcitonin: 6.99 ng/mL

## 2021-02-10 LAB — BASIC METABOLIC PANEL
Anion gap: 9 (ref 5–15)
BUN: 51 mg/dL — ABNORMAL HIGH (ref 8–23)
CO2: 24 mmol/L (ref 22–32)
Calcium: 8.5 mg/dL — ABNORMAL LOW (ref 8.9–10.3)
Chloride: 98 mmol/L (ref 98–111)
Creatinine, Ser: 1.76 mg/dL — ABNORMAL HIGH (ref 0.44–1.00)
GFR, Estimated: 28 mL/min — ABNORMAL LOW (ref >60)
Glucose, Bld: 98 mg/dL (ref 70–99)
Potassium: 4.3 mmol/L (ref 3.5–5.1)
Sodium: 131 mmol/L — ABNORMAL LOW (ref 135–145)

## 2021-02-10 LAB — BRAIN NATRIURETIC PEPTIDE: B Natriuretic Peptide: 292.1 pg/mL — ABNORMAL HIGH (ref 0.0–100.0)

## 2021-02-10 LAB — URIC ACID: Uric Acid, Serum: 9.9 mg/dL — ABNORMAL HIGH (ref 2.5–7.1)

## 2021-02-10 LAB — C-REACTIVE PROTEIN: CRP: 22.7 mg/dL — ABNORMAL HIGH (ref <1.0)

## 2021-02-10 SURGERY — ENTEROSCOPY
Anesthesia: Monitor Anesthesia Care

## 2021-02-10 MED ORDER — LIDOCAINE 2% (20 MG/ML) 5 ML SYRINGE
INTRAMUSCULAR | Status: DC | PRN
  Administered 2021-02-10: 40 mg via INTRAVENOUS

## 2021-02-10 MED ORDER — PROPOFOL 10 MG/ML IV BOLUS
INTRAVENOUS | Status: DC | PRN
  Administered 2021-02-10: 20 mg via INTRAVENOUS

## 2021-02-10 MED ORDER — PROPOFOL 500 MG/50ML IV EMUL
INTRAVENOUS | Status: DC | PRN
  Administered 2021-02-10: 125 ug/kg/min via INTRAVENOUS

## 2021-02-10 MED ORDER — EZETIMIBE 10 MG PO TABS
10.0000 mg | ORAL_TABLET | Freq: Every day | ORAL | Status: DC
  Administered 2021-02-10 – 2021-02-15 (×6): 10 mg via ORAL
  Filled 2021-02-10 (×6): qty 1

## 2021-02-10 MED ORDER — LACTATED RINGERS IV SOLN: INTRAVENOUS | Status: DC | PRN

## 2021-02-10 MED ORDER — B-12 1000 MCG PO TBCR: EXTENDED_RELEASE_TABLET | Freq: Every day | ORAL | Status: DC

## 2021-02-10 MED ORDER — TORSEMIDE 20 MG PO TABS
40.0000 mg | ORAL_TABLET | Freq: Two times a day (BID) | ORAL | Status: DC
  Administered 2021-02-10 – 2021-02-15 (×9): 40 mg via ORAL
  Filled 2021-02-10 (×11): qty 2

## 2021-02-10 NOTE — Progress Notes (Signed)
PROGRESS NOTE        PATIENT DETAILS Name: [name] Age: 87 y.o. Sex: female Date of Birth: [birth year] Admit Date: 02/08/2021 Admitting Physician Norval Morton, MD EYC:XKGY, Jobe Marker, MD  Brief Narrative: Patient is a 87 y.o. female with history of A. 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  See below for further details.  Subjective: Patient in bed, appears comfortable, denies any headache, no fever, no chest pain or pressure, no shortness of breath , no abdominal pain. No new focal weakness.   Objective: Vitals: Blood pressure (!) 107/53, pulse 60, temperature 97.7 F (36.5 C), resp. rate 20, height 5' 6.5" (1.689 m), weight 70.9 kg, SpO2 98 %.   Exam:  Awake Alert, No new F.N deficits, Normal affect [place].AT,PERRAL Supple Neck,No JVD, No cervical lymphadenopathy appriciated.  Symmetrical Chest wall movement, Good air movement bilaterally, CTAB RRR,No Gallops, Rubs or new Murmurs, No Parasternal Heave +ve B.Sounds, Abd Soft, No tenderness, No organomegaly appriciated, No rebound - guarding or rigidity.  Skin: L Leg as below     Pertinent Labs/Radiology: WBC: 7.2 Hb: 8.7 Na: 131 K: 3.7 Creatinine: 1.89  10/6>>Blood culture: Pending 10/6>> x-ray left ankle: Diffuse edema-no osteomyelitis. 10/ 8EGD  Impression:               - Normal esophagus.                           - A few non-bleeding angiodysplastic lesions in the                            stomach. Treated with a monopolar probe.                           - Four bleeding angiodysplastic lesions in the                            duodenum. Treated with a monopolar probe.                           - A single non-bleeding  angiodysplastic lesion in                            the jejunum. Treated with a monopolar probe.                           - No specimens collected. Recommendation:           - Return patient to hospital ward for ongoing care.                           - Resume regular diet.                           - Restart Eliquis in 3 days.                           -  Follow HGB and transfuse as necessary.  Assessment/Plan:  Sepsis due to left leg cellulitis superimposed on underlying squamous cell carcinoma: Sepsis physiology slowly improving-continue empiric antibiotics-await cultures.  Vascular surgery planning angiogram on 10/10, check MRSA Nasal PCR.  Hypotension: on low dose midodrine.  Melena-likely slow GI bleeding-with acute on chronic blood loss anemia: Having melanotic stools-2 g drop in hemoglobin-seen by GI, post EGD 02/10/21 - few non-bleeding angiodysplastic lesions in the      stomach. Treated with a monopolar probe. Four bleeding angiodysplastic lesions in the                           duodenum. Treated with a monopolar probe. A single non-bleeding angiodysplastic lesion in                          the jejunum. Treated with a monopolar probe, continue PPI resume Eliquis on 02/14/2021 per GI.  Monitor H&H.  PAD: Vascular surgery following-angiogram planned for 10/10-after Eliquis washout.  Acute hypoxic respiratory failure due to COPD exacerbation: Improved-minimal wheezing present today-continue bronchodilators.  Attempt to titrate off oxygen.  CKD stage IIIb: Creatinine slightly elevated-also developing some leg edema, gentle less than half home dose diuretic to be resumed on 02/10/2021.Marland Kitchen  Chronic atrial fibrillation: Paced rhythm-Eliquis on hold  HFrEF: Euvolemic on exam-her EF has recovered-she has ICD in place.  HTN: BP improved on low-dose midodrine.  HLD: Continue statin  Hypothyroidism: Continue Synthroid  GERD: Continue PPI  Squamous cell carcinoma involving left leg  ulcer: Stable for outpatient follow-up with dermatology    Consults: GI,  DVT Prophylaxis: SCD's Code Status:Full code  Family Communication: None at bedside  Time spent: 35 minutes-Greater than 50% of this time was spent in counseling, explanation of diagnosis, planning of further management, and coordination of care.  Diet: Diet Order             Diet NPO time specified  Diet effective midnight                      Disposition Plan: Status is: Inpatient  Remains inpatient appropriate because:Inpatient level of care appropriate due to severity of illness  Dispo: The patient is from: Home              Anticipated d/c is to: Home              Patient currently is not medically stable to d/c.   Difficult to place patient No     Barriers to Discharge: Resolving sepsis physiology-possible GI bleeding-not yet stable for discharge.  Antimicrobial agents: Anti-infectives (From admission, onward)    Start     Dose/Rate Route Frequency Ordered Stop   02/10/21 1000  vancomycin (VANCOREADY) IVPB 1250 mg/250 mL  Status:  Discontinued        1,250 mg 166.7 mL/hr over 90 Minutes Intravenous Every 48 hours 02/08/21 1346 02/09/21 1425   02/10/21 1000  [MAR Hold]  vancomycin (VANCOCIN) IVPB 1000 mg/200 mL premix        (MAR Hold since Sat 02/10/2021 at 0854.Hold Reason: Transfer to a Procedural area)   1,000 mg 200 mL/hr over 60 Minutes Intravenous Every 48 hours 02/09/21 1425     02/08/21 1130  [MAR Hold]  ceFEPIme (MAXIPIME) 2 g in sodium chloride 0.9 % 100 mL IVPB        (MAR Hold since Sat 02/10/2021 at 0854.Hold Reason:  Transfer to a Procedural area)   2 g 200 mL/hr over 30 Minutes Intravenous Every 24 hours 02/08/21 1112     02/08/21 1115  vancomycin (VANCOREADY) IVPB 1250 mg/250 mL        1,250 mg 166.7 mL/hr over 90 Minutes Intravenous  Once 02/08/21 1112 02/08/21 1509   02/08/21 0000  cephALEXin (KEFLEX) 500 MG capsule        500 mg Oral 2 times daily 02/08/21 0719      02/08/21 0000  doxycycline (VIBRAMYCIN) 100 MG capsule        100 mg Oral 2 times daily 02/08/21 0719          MEDICATIONS: Scheduled Meds:  B-12   Oral Daily   ezetimibe  10 mg Oral Daily   [MAR Hold] levothyroxine  25 mcg Oral Q0600   [MAR Hold] lidocaine  2 patch Transdermal Q24H   [MAR Hold] midodrine  5 mg Oral TID WC   [MAR Hold] pantoprazole (PROTONIX) IV  40 mg Intravenous Q12H   [MAR Hold] rosuvastatin  40 mg Oral Daily   [MAR Hold] sodium chloride flush  3 mL Intravenous Q12H   torsemide  40 mg Oral BID   [MAR Hold] traMADol  50 mg Oral Once   Continuous Infusions:  [MAR Hold] ceFEPime (MAXIPIME) IV 2 g (02/09/21 0952)   [MAR Hold] vancomycin     PRN Meds:.[MAR Hold] acetaminophen **OR** [MAR Hold] acetaminophen, [MAR Hold] albuterol, [MAR Hold] fluticasone, [MAR Hold] ondansetron **OR** [MAR Hold] ondansetron (ZOFRAN) IV, [MAR Hold] traMADol   I have personally reviewed following labs and imaging studies  LABORATORY DATA: CBC: Recent Labs  Lab 02/08/21 0156 02/09/21 0207 02/09/21 1105 02/09/21 1800 02/10/21 0210  WBC 9.1 7.2 6.5 7.7 5.7  NEUTROABS 8.0*  --   --   --   --   HGB 10.2* 8.7* 9.1* 9.6* 8.6*  HCT 32.3* 27.8* 29.3* 31.3* 27.6*  MCV 91.8 92.4 92.1 93.7 91.4  PLT 172 130* 151 158 129*    Basic Metabolic Panel: Recent Labs  Lab 02/08/21 0156 02/09/21 0207 02/10/21 0210  NA 135 131* 131*  K 3.9 3.7 4.3  CL 96* 97* 98  CO2 27 24 24   GLUCOSE 124* 103* 98  BUN 39* 48* 51*  CREATININE 1.63* 1.89* 1.76*  CALCIUM 9.5 8.1* 8.5*    GFR: Estimated Creatinine Clearance: 22.7 mL/min (A) (by C-G formula based on SCr of 1.76 mg/dL (H)).  Liver Function Tests: Recent Labs  Lab 02/08/21 0156  AST 18  ALT 15  ALKPHOS 46  BILITOT 0.9  PROT 6.6  ALBUMIN 4.0   No results for input(s): LIPASE, AMYLASE in the last 168 hours. No results for input(s): AMMONIA in the last 168 hours.  Coagulation Profile: No results for input(s): INR, PROTIME  in the last 168 hours.  Cardiac Enzymes: No results for input(s): CKTOTAL, CKMB, CKMBINDEX, TROPONINI in the last 168 hours.  BNP (last 3 results) No results for input(s): PROBNP in the last 8760 hours.  Lipid Profile: Recent Labs    02/09/21 0207  CHOL 101  HDL 28*  LDLCALC 58  TRIG 74  CHOLHDL 3.6    Thyroid Function Tests: No results for input(s): TSH, T4TOTAL, FREET4, T3FREE, THYROIDAB in the last 72 hours.  Anemia Panel: No results for input(s): VITAMINB12, FOLATE, FERRITIN, TIBC, IRON, RETICCTPCT in the last 72 hours.  Urine analysis:    Component Value Date/Time   COLORURINE YELLOW 02/08/2021 [place] 02/08/2021  [place] (A) 01/27/2018 1330   LABSPEC 1.018 02/08/2021 0942   PHURINE 5.0 02/08/2021 0942   GLUCOSEU NEGATIVE 02/08/2021 0942   HGBUR NEGATIVE 02/08/2021 0942   HGBUR large 11/16/2009 1202   BILIRUBINUR NEGATIVE 02/08/2021 0942   BILIRUBINUR neg 05/02/2020 0911   BILIRUBINUR Negative 01/27/2018 1330   [place] 02/08/2021 0942   PROTEINUR NEGATIVE 02/08/2021 0942   UROBILINOGEN 0.2 05/02/2020 0911   UROBILINOGEN 1.0 08/16/2014 1754   NITRITE NEGATIVE 02/08/2021 0942   LEUKOCYTESUR LARGE (A) 02/08/2021 0942    Sepsis Labs: Lactic Acid, Venous    Component Value Date/Time   LATICACIDVEN 1.4 02/08/2021 0352      RADIOLOGY STUDIES/RESULTS: DG Ankle Complete Left  Result Date: 02/08/2021 CLINICAL DATA:  Left lateral foot wound, left ankle pain. EXAM: LEFT ANKLE COMPLETE - 3+ VIEW COMPARISON:  Foot radiograph 11/10/2019 FINDINGS: Mild likely chronic periosteal reaction along the distal tibia medially, probably from chronic venous insufficiency. No fracture or acute bony findings. No bony destructive findings characteristic of osteomyelitis, I do note that the posterior portion of the calcaneus was excluded on the lateral projection and if this is a specific area of concern thin please notify the radiology  department and we can attempt another lateral view. There is soft tissue swelling and subcutaneous edema along the lower calf and ankle. IMPRESSION: 1. Diffuse subcutaneous edema. 2. No bony destructive findings characteristic of osteomyelitis in the ankle region. Please note the posterior calcaneus is excluded on the lateral projection. 3. Likely chronic periosteal reaction along the distal tibia, most likely from chronic venous insufficiency. Electronically Signed   By: Van Clines M.D.   On: 02/08/2021 12:10   VAS [place] ABI WITH/WO TBI  Result Date: 02/08/2021  LOWER EXTREMITY DOPPLER STUDY Patient Name:  JAIYLA GRANADOS  Date of Exam:   02/08/2021 Medical Rec #: [id number]          Accession #:    [id number] Date of Birth: 1933/07/19          Patient Gender: F Patient Age:   89 years Exam Location:  [facility] Procedure:      VAS [place] ABI WITH/WO TBI Referring Phys: Gareth [name] --------------------------------------------------------------------------------  Indications: Ulceration. High Risk Factors: Hypertension, hyperlipidemia.  Limitations: Today's exam was limited due to patient intolerant to cuff              pressure. Comparison Study: 03/03/2019 - Unable to get pressures. Waveforms and toe                   pressures are abnormal                    Summary:                   Right: The right toe-brachial index is abnormal.                    Unable to get pressures. Waveforms and toe pressures are                   abnormal. Performing Technologist: Carlos Levering RVT  Examination Guidelines: A complete evaluation includes at minimum, Doppler waveform signals and systolic blood pressure reading at the level of bilateral brachial, anterior tibial, and posterior tibial arteries, when vessel segments are accessible. Bilateral testing is considered an integral part of a complete examination. Photoelectric Plethysmograph (PPG) waveforms and toe systolic pressure readings are included  as  required and additional duplex testing as needed. Limited examinations for reoccurring indications may be performed as noted.  ABI Findings: +--------+------------------+-----+----------+--------+ Right   Rt Pressure (mmHg)IndexWaveform  Comment  +--------+------------------+-----+----------+--------+ [id number]                    triphasic          +--------+------------------+-----+----------+--------+ PTA     84                0.67 monophasic         +--------+------------------+-----+----------+--------+ DP      61                0.49 monophasic         +--------+------------------+-----+----------+--------+ +--------+------------------+-----+----------+-------+ Left    Lt Pressure (mmHg)IndexWaveform  Comment +--------+------------------+-----+----------+-------+ [id number]                    triphasic         +--------+------------------+-----+----------+-------+ PTA     64                0.51 monophasic        +--------+------------------+-----+----------+-------+ DP      60                0.48 monophasic        +--------+------------------+-----+----------+-------+ +-------+-----------+-----------+------------+------------+ ABI/TBIToday's ABIToday's TBIPrevious ABIPrevious TBI +-------+-----------+-----------+------------+------------+ Right  0.67                                           +-------+-----------+-----------+------------+------------+ Left   0.51                                           +-------+-----------+-----------+------------+------------+  Summary: Right: Resting right ankle-brachial index indicates moderate right lower extremity arterial disease. Unable to obtain TBI due to low amplitude waveforms. Left: Resting left ankle-brachial index indicates moderate left lower extremity arterial disease. Unable to obtain TBI due to low amplitude waveforms.  *See table(s) above for measurements and observations.  Electronically  signed by Jamelle Haring on 02/08/2021 at 6:23:35 PM.    Final      LOS: 2 days   Signature  Lala Lund M.D on 02/10/2021 at 10:15 AM   -  To page go to [email or web address]

## 2021-02-10 NOTE — Transfer of Care (Signed)
Immediate Anesthesia Transfer of Care Note  Patient: [name]  Procedure(s) Performed: ENTEROSCOPY HOT HEMOSTASIS (ARGON PLASMA COAGULATION/BICAP)  Patient Location: PACU  Anesthesia Type:MAC  Level of Consciousness: sedated  Airway & Oxygen Therapy: Patient Spontanous Breathing and Patient connected to nasal cannula oxygen  Post-op Assessment: Report given to RN and Post -op Vital signs reviewed and stable  Post vital signs: Reviewed and stable  Last Vitals:  Vitals Value Taken Time  BP 82/42 02/10/21 0952  Temp    Pulse 68 02/10/21 0955  Resp 15 02/10/21 0955  SpO2 96 % 02/10/21 0955  Vitals shown include unvalidated device data.  Last Pain:  Vitals:   02/10/21 0858  TempSrc: Temporal  PainSc: 0-No pain      Patients Stated Pain Goal: 3 (46/27/03 5009)  Complications: No notable events documented.

## 2021-02-10 NOTE — Interval H&P Note (Signed)
History and Physical Interval Note:  02/10/2021 9:06 AM  [name]  has presented today for surgery, with the diagnosis of anemia, hemocult positive stool.  The various methods of treatment have been discussed with the patient and family. After consideration of risks, benefits and other options for treatment, the patient has consented to  Procedure(s): ENTEROSCOPY (N/A) as a surgical intervention.  The patient's history has been reviewed, patient examined, no change in status, stable for surgery.  I have reviewed the patient's chart and labs.  Questions were answered to the patient's satisfaction.     Chesky Heyer D

## 2021-02-10 NOTE — Anesthesia Postprocedure Evaluation (Signed)
Anesthesia Post Note  Patient: [name]  Procedure(s) Performed: ENTEROSCOPY HOT HEMOSTASIS (ARGON PLASMA COAGULATION/BICAP)     Patient location during evaluation: PACU Anesthesia Type: MAC Level of consciousness: awake Pain management: pain level controlled Vital Signs Assessment: post-procedure vital signs reviewed and stable Respiratory status: spontaneous breathing, nonlabored ventilation, respiratory function stable and patient connected to nasal cannula oxygen Cardiovascular status: stable and blood pressure returned to baseline Postop Assessment: no apparent nausea or vomiting Anesthetic complications: no   No notable events documented.  Last Vitals:  Vitals:   02/10/21 0953 02/10/21 1008  BP: (!) 104/36 (!) 107/53  Pulse: 61 60  Resp: 15 20  Temp: (!) 36.4 C 36.5 C  SpO2: 94% 98%    Last Pain:  Vitals:   02/10/21 1008  TempSrc:   PainSc: 0-No pain                 Perley Arthurs P Ell Tiso

## 2021-02-10 NOTE — Op Note (Signed)
[facility] Patient Name: [name] Procedure Date : 02/10/2021 MRN: [id number] Attending MD: Carol Ada , MD Date of Birth: 1933/12/19 CSN: [id number] Age: 87 Admit Type: Inpatient Procedure:                Small bowel enteroscopy Indications:              Melena Providers:                Carol Ada, MD, Grace Isaac, RN, Ladona Ridgel,                            Technician, Clearnce Sorrel, CRNA Referring MD:              Medicines:                Propofol per Anesthesia Complications:            No immediate complications. Estimated Blood Loss:     Estimated blood loss: none. Procedure:                Pre-Anesthesia Assessment:                           - Prior to the procedure, a History and Physical                            was performed, and patient medications and                            allergies were reviewed. The patient's tolerance of                            previous anesthesia was also reviewed. The risks                            and benefits of the procedure and the sedation                            options and risks were discussed with the patient.                            All questions were answered, and informed consent                            was obtained. Prior Anticoagulants: The patient has                            taken no previous anticoagulant or antiplatelet                            agents. ASA Grade Assessment: III - A patient with                            severe systemic disease. After reviewing the risks  and benefits, the patient was deemed in                            satisfactory condition to undergo the procedure.                           - Sedation was administered by an anesthesia                            professional. Deep sedation was attained.                           After obtaining informed consent, the endoscope was                            passed under direct vision.  Throughout the                            procedure, the patient's blood pressure, pulse, and                            oxygen saturations were monitored continuously. The                            PCF-190TL ([id number]) Olympus colonoscope was                            introduced through the mouth and advanced to the                            small bowel distal to the Ligament of Treitz. The                            small bowel enteroscopy was accomplished without                            difficulty. The patient tolerated the procedure                            well. Scope In: Scope Out: Findings:      The esophagus was normal.      A few diminutive angiodysplastic lesions with no bleeding were found in       the gastric body and in the gastric antrum. Coagulation for tissue       destruction using monopolar probe was successful. Estimated blood loss:       none.      Four angiodysplastic lesions with bleeding were found in the duodenal       bulb and in the second portion of the duodenum. Coagulation for       hemostasis using monopolar probe was successful. Estimated blood loss:       none.      A single angiodysplastic lesion with no bleeding was found in the       proximal jejunum. Coagulation for tissue destruction using monopolar       probe was successful.  In the gastric lumen there wsa evidence of some punctate erythematous       lesions. It was not clear if this was representative of an atypical AVM.       These lesions were ablated with APC. In the duodenal bulb there was       evidence of some bleeding and it was isolated to three small AVMs. These       were actively bleeding and not secondary to scope trauma. These lesions       were ablated. A classic appearing AVM was noted in the second portion of       the duodenum and this was treated with APC. Impression:               - Normal esophagus.                           - A few non-bleeding angiodysplastic  lesions in the                            stomach. Treated with a monopolar probe.                           - Four bleeding angiodysplastic lesions in the                            duodenum. Treated with a monopolar probe.                           - A single non-bleeding angiodysplastic lesion in                            the jejunum. Treated with a monopolar probe.                           - No specimens collected. Recommendation:           - Return patient to hospital ward for ongoing care.                           - Resume regular diet.                           - Restart Eliquis in 3 days.                           - Follow HGB and transfuse as necessary. Procedure Code(s):        --- Professional ---                           [phone], Small intestinal endoscopy, enteroscopy                            beyond second portion of duodenum, not including                            ileum; with ablation of tumor(s), polyp(s), or  other lesion(s) not amenable to removal by hot                            biopsy forceps, bipolar cautery or snare technique                           [id number], 59, Small intestinal endoscopy, enteroscopy                            beyond second portion of duodenum, not including                            ileum; with control of bleeding (eg, injection,                            bipolar cautery, unipolar cautery, laser, heater                            probe, stapler, plasma coagulator) Diagnosis Code(s):        --- Professional ---                           K31.811, Angiodysplasia of stomach and duodenum                            with bleeding                           K55.20, Angiodysplasia of colon without hemorrhage                           K92.1, Melena (includes Hematochezia) CPT copyright 2019 American Medical Association. All rights reserved. The codes documented in this report are preliminary and upon coder review may  be  revised to meet current compliance requirements. Carol Ada, MD Carol Ada, MD 02/10/2021 9:58:58 AM This report has been signed electronically. Number of Addenda: 0

## 2021-02-10 NOTE — Anesthesia Preprocedure Evaluation (Signed)
Anesthesia Evaluation  Patient identified by MRN, date of birth, ID band Patient awake    Reviewed: Allergy & Precautions, NPO status , Patient's Chart, lab work & pertinent test results  History of Anesthesia Complications (+) PONV and history of anesthetic complications  Airway Mallampati: II  TM Distance: >3 FB Neck ROM: Full    Dental  (+) Missing   Pulmonary COPD, former smoker,    Pulmonary exam normal breath sounds clear to auscultation       Cardiovascular hypertension, Pt. on medications + CAD, + Peripheral Vascular Disease and +CHF  Normal cardiovascular exam+ dysrhythmias Atrial Fibrillation + pacemaker + Cardiac Defibrillator  Rhythm:Regular Rate:Normal     Neuro/Psych  Headaches, PSYCHIATRIC DISORDERS TIA   GI/Hepatic negative GI ROS, Neg liver ROS,   Endo/Other  Hypothyroidism   Renal/GU CRFRenal disease     Musculoskeletal  (+) Arthritis ,   Abdominal   Peds  Hematology  (+) Blood dyscrasia, anemia , HLD   Anesthesia Other Findings anemia  hemocult positive stool  Reproductive/Obstetrics                             Anesthesia Physical Anesthesia Plan  ASA: 3  Anesthesia Plan: MAC   Post-op Pain Management:    Induction: Intravenous  PONV Risk Score and Plan: 3 and Propofol infusion and Treatment may vary due to age or medical condition  Airway Management Planned: Nasal Cannula  Additional Equipment:   Intra-op Plan:   Post-operative Plan:   Informed Consent: I have reviewed the patients History and Physical, chart, labs and discussed the procedure including the risks, benefits and alternatives for the proposed anesthesia with the patient or authorized representative who has indicated his/her understanding and acceptance.     Dental advisory given  Plan Discussed with: CRNA  Anesthesia Plan Comments:         Anesthesia Quick Evaluation

## 2021-02-11 ENCOUNTER — Encounter (HOSPITAL_COMMUNITY): Payer: Self-pay | Admitting: Gastroenterology

## 2021-02-11 LAB — CBC WITH DIFFERENTIAL/PLATELET
Abs Immature Granulocytes: 0.02 10*3/uL (ref 0.00–0.07)
Basophils Absolute: 0 10*3/uL (ref 0.0–0.1)
Basophils Relative: 0 %
Eosinophils Absolute: 0 10*3/uL (ref 0.0–0.5)
Eosinophils Relative: 1 %
HCT: 29.8 % — ABNORMAL LOW (ref 36.0–46.0)
Hemoglobin: 9.1 g/dL — ABNORMAL LOW (ref 12.0–15.0)
Immature Granulocytes: 1 %
Lymphocytes Relative: 6 %
Lymphs Abs: 0.3 10*3/uL — ABNORMAL LOW (ref 0.7–4.0)
MCH: 28.3 pg (ref 26.0–34.0)
MCHC: 30.5 g/dL (ref 30.0–36.0)
MCV: 92.5 fL (ref 80.0–100.0)
Monocytes Absolute: 0.7 10*3/uL (ref 0.1–1.0)
Monocytes Relative: 15 %
Neutro Abs: 3.4 10*3/uL (ref 1.7–7.7)
Neutrophils Relative %: 77 %
Platelets: 157 10*3/uL (ref 150–400)
RBC: 3.22 MIL/uL — ABNORMAL LOW (ref 3.87–5.11)
RDW: 15.9 % — ABNORMAL HIGH (ref 11.5–15.5)
WBC: 4.3 10*3/uL (ref 4.0–10.5)
nRBC: 0 % (ref 0.0–0.2)

## 2021-02-11 LAB — BRAIN NATRIURETIC PEPTIDE: B Natriuretic Peptide: 514.3 pg/mL — ABNORMAL HIGH (ref 0.0–100.0)

## 2021-02-11 LAB — COMPREHENSIVE METABOLIC PANEL
ALT: 34 U/L (ref 0–44)
AST: 50 U/L — ABNORMAL HIGH (ref 15–41)
Albumin: 3.2 g/dL — ABNORMAL LOW (ref 3.5–5.0)
Alkaline Phosphatase: 58 U/L (ref 38–126)
Anion gap: 9 (ref 5–15)
BUN: 47 mg/dL — ABNORMAL HIGH (ref 8–23)
CO2: 27 mmol/L (ref 22–32)
Calcium: 8.6 mg/dL — ABNORMAL LOW (ref 8.9–10.3)
Chloride: 98 mmol/L (ref 98–111)
Creatinine, Ser: 1.48 mg/dL — ABNORMAL HIGH (ref 0.44–1.00)
GFR, Estimated: 35 mL/min — ABNORMAL LOW (ref >60)
Glucose, Bld: 117 mg/dL — ABNORMAL HIGH (ref 70–99)
Potassium: 3.5 mmol/L (ref 3.5–5.1)
Sodium: 134 mmol/L — ABNORMAL LOW (ref 135–145)
Total Bilirubin: 0.9 mg/dL (ref 0.3–1.2)
Total Protein: 6.1 g/dL — ABNORMAL LOW (ref 6.5–8.1)

## 2021-02-11 LAB — PROCALCITONIN: Procalcitonin: 5.69 ng/mL

## 2021-02-11 LAB — C-REACTIVE PROTEIN: CRP: 18.9 mg/dL — ABNORMAL HIGH (ref <1.0)

## 2021-02-11 LAB — MAGNESIUM: Magnesium: 2 mg/dL (ref 1.7–2.4)

## 2021-02-11 LAB — MRSA NEXT GEN BY PCR, NASAL: MRSA by PCR Next Gen: NOT DETECTED

## 2021-02-11 NOTE — Progress Notes (Signed)
PROGRESS NOTE        PATIENT DETAILS Name: [name] Age: 87 y.o. Sex: female Date of Birth: 28-Sep-1933 Admit Date: 02/08/2021 Admitting Physician Norval Morton, MD [id number], Jobe Marker, MD  Brief Narrative: Patient is a 87 y.o. female with history of A. 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  See below for further details.  Subjective: Patient in bed, appears comfortable, denies any headache, no fever, no chest pain or pressure, no shortness of breath , no abdominal pain. No new focal weakness.    Objective: Vitals: Blood pressure 138/61, pulse 60, temperature 97.7 F (36.5 C), temperature source Axillary, resp. rate 20, height 5' 6.5" (1.689 m), weight 70.9 kg, SpO2 97 %.   Exam:  Awake Alert, No new F.N deficits, Normal affect [place].AT,PERRAL Supple Neck,No JVD,   Symmetrical Chest wall movement, Good air movement bilaterally, CTAB RRR,No Gallops, Rubs or new Murmurs,   +ve B.Sounds, Abd Soft, No tenderness, No organomegaly appriciated,     Skin: L Leg as below     Pertinent Labs/Radiology: WBC: 7.2 Hb: 8.7 Na: 131 K: 3.7 Creatinine: 1.89  10/6>>Blood culture: Pending 10/6>> x-ray left ankle: Diffuse edema-no osteomyelitis. 10/ 8EGD  Impression:               - Normal esophagus.                           - A few non-bleeding angiodysplastic lesions in the                            stomach. Treated with a monopolar probe.                           - Four bleeding angiodysplastic lesions in the                            duodenum. Treated with a monopolar probe.                           - A single non-bleeding angiodysplastic lesion in                            the  jejunum. Treated with a monopolar probe.                           - No specimens collected. Recommendation:           - Return patient to hospital ward for ongoing care.                           - Resume regular diet.                           - Restart Eliquis in 3 days.                           -  Follow HGB and transfuse as necessary.  Assessment/Plan:  Sepsis due to left leg cellulitis superimposed on underlying squamous cell carcinoma: Sepsis physiology slowly improving-continue empiric antibiotics-await cultures.  Vascular surgery planning angiogram on 10/10, check MRSA Nasal PCR.  Hypotension: on low dose midodrine.  Melena-likely slow GI bleeding-with acute on chronic blood loss anemia: Having melanotic stools-2 g drop in hemoglobin-seen by GI, post EGD 02/10/21 - few non-bleeding angiodysplastic lesions in the stomach. Treated with a monopolar probe. Four bleeding angiodysplastic lesions in the duodenum. Treated with a monopolar probe. A single non-bleeding angiodysplastic lesion in the jejunum. Treated with a monopolar probe, continue PPI resume Eliquis on 02/14/2021 per GI. Monitor H&H.  PAD: Vascular surgery following-angiogram planned for 10/10-after Eliquis washout.  Acute hypoxic respiratory failure due to COPD exacerbation: Improved-minimal wheezing present today-continue bronchodilators.  Attempt to titrate off oxygen.  CKD stage IIIb: Creatinine slightly elevated-also developing some leg edema, resumed less than half home dose diuretic on 02/10/2021.  Chronic atrial fibrillation: Paced rhythm-Eliquis on hold  HFrEF: Euvolemic on exam-her EF has recovered-she has ICD in place.  HTN: BP improved on low-dose midodrine.  HLD: Continue statin  Hypothyroidism: Continue Synthroid  GERD: Continue PPI  Squamous cell carcinoma involving left leg ulcer: Stable for outpatient follow-up with dermatology  Hyperuricemia - follow with PCP, doubt any acute issue.    Consults:  GI,  DVT Prophylaxis: SCD's Code Status:Full code  Family Communication: None at bedside  Time spent: 35 minutes-Greater than 50% of this time was spent in counseling, explanation of diagnosis, planning of further management, and coordination of care.  Diet: Diet Order             Diet NPO time specified Except for: Sips with Meds  Diet effective midnight           DIET SOFT Room service appropriate? Yes; Fluid consistency: Nectar Thick  Diet effective now                    Disposition Plan: Status is: Inpatient  Remains inpatient appropriate because:Inpatient level of care appropriate due to severity of illness  Dispo: The patient is from: Home              Anticipated d/c is to: Home              Patient currently is not medically stable to d/c.   Difficult to place patient No     Barriers to Discharge: Resolving sepsis physiology-possible GI bleeding-not yet stable for discharge.  Antimicrobial agents: Anti-infectives (From admission, onward)    Start     Dose/Rate Route Frequency Ordered Stop   02/10/21 1000  vancomycin (VANCOREADY) IVPB 1250 mg/250 mL  Status:  Discontinued        1,250 mg 166.7 mL/hr over 90 Minutes Intravenous Every 48 hours 02/08/21 1346 02/09/21 1425   02/10/21 1000  vancomycin (VANCOCIN) IVPB 1000 mg/200 mL premix        1,000 mg 200 mL/hr over 60 Minutes Intravenous Every 48 hours 02/09/21 1425     02/08/21 1130  ceFEPIme (MAXIPIME) 2 g in sodium chloride 0.9 % 100 mL IVPB        2 g 200 mL/hr over 30 Minutes Intravenous Every 24 hours 02/08/21 1112     02/08/21 1115  vancomycin (VANCOREADY) IVPB 1250 mg/250 mL        1,250 mg 166.7 mL/hr over 90 Minutes Intravenous  Once 02/08/21 1112 02/08/21 1509   02/08/21 0000  cephALEXin (KEFLEX) 500 MG capsule        500 mg Oral 2 times daily 02/08/21 0719     02/08/21 0000  doxycycline (VIBRAMYCIN) 100 MG capsule        100 mg Oral 2 times daily 02/08/21 0719           MEDICATIONS: Scheduled Meds:  ezetimibe  10 mg Oral Daily   levothyroxine  25 mcg Oral Q0600   lidocaine  2 patch Transdermal Q24H   midodrine  5 mg Oral TID WC   pantoprazole (PROTONIX) IV  40 mg Intravenous Q12H   rosuvastatin  40 mg Oral Daily   sodium chloride flush  3 mL Intravenous Q12H   torsemide  40 mg Oral BID   traMADol  50 mg Oral Once   Continuous Infusions:  ceFEPime (MAXIPIME) IV 2 g (02/11/21 1000)   vancomycin Stopped (02/10/21 1838)   PRN Meds:.acetaminophen **OR** acetaminophen, albuterol, fluticasone, ondansetron **OR** ondansetron (ZOFRAN) IV, traMADol   I have personally reviewed following labs and imaging studies  LABORATORY DATA: CBC: Recent Labs  Lab 02/08/21 0156 02/09/21 0207 02/09/21 1800 02/10/21 0210 02/10/21 1115 02/10/21 1824 02/11/21 0253  WBC 9.1   < > 7.7 5.7 4.7 3.6* 4.3  NEUTROABS 8.0*  --   --   --   --   --  3.4  HGB 10.2*   < > 9.6* 8.6* 9.5* 8.8* 9.1*  HCT 32.3*   < > 31.3* 27.6* 30.1* 28.6* 29.8*  MCV 91.8   < > 93.7 91.4 91.2 91.7 92.5  PLT 172   < > 158 129* 127* 137* 157   < > = values in this interval not displayed.    Basic Metabolic Panel: Recent Labs  Lab 02/08/21 0156 02/09/21 0207 02/10/21 0210 02/11/21 0253  NA 135 131* 131* 134*  K 3.9 3.7 4.3 3.5  CL 96* 97* 98 98  CO2 27 24 24 27   GLUCOSE 124* 103* 98 117*  BUN 39* 48* 51* 47*  CREATININE 1.63* 1.89* 1.76* 1.48*  CALCIUM 9.5 8.1* 8.5* 8.6*  MG  --   --   --  2.0    GFR: Estimated Creatinine Clearance: 27 mL/min (A) (by C-G formula based on SCr of 1.48 mg/dL (H)).  Liver Function Tests: Recent Labs  Lab 02/08/21 0156 02/11/21 0253  AST 18 50*  ALT 15 34  ALKPHOS 46 58  BILITOT 0.9 0.9  PROT 6.6 6.1*  ALBUMIN 4.0 3.2*   No results for input(s): LIPASE, AMYLASE in the last 168 hours. No results for input(s): AMMONIA in the last 168 hours.  Coagulation Profile: No results for input(s): INR, PROTIME in the last 168 hours.  Cardiac  Enzymes: No results for input(s): CKTOTAL, CKMB, CKMBINDEX, TROPONINI in the last 168 hours.  BNP (last 3 results) No results for input(s): PROBNP in the last 8760 hours.  Lipid Profile: Recent Labs    02/09/21 0207  CHOL 101  HDL 28*  LDLCALC 58  TRIG 74  CHOLHDL 3.6    Thyroid Function Tests: No results for input(s): TSH, T4TOTAL, FREET4, T3FREE, THYROIDAB in the last 72 hours.  Anemia Panel: No results for input(s): VITAMINB12, FOLATE, FERRITIN, TIBC, IRON, RETICCTPCT in the last 72 hours.  Urine analysis:    Component Value Date/Time   COLORURINE YELLOW 02/08/2021 0942   APPEARANCEUR CLEAR 02/08/2021 0942   APPEARANCEUR Cloudy (A) 01/27/2018 1330   LABSPEC 1.018 02/08/2021 0942   PHURINE 5.0 02/08/2021 0942   GLUCOSEU  NEGATIVE 02/08/2021 0942   HGBUR NEGATIVE 02/08/2021 0942   HGBUR large 11/16/2009 1202   BILIRUBINUR NEGATIVE 02/08/2021 0942   BILIRUBINUR neg 05/02/2020 0911   BILIRUBINUR Negative 01/27/2018 1330   KETONESUR NEGATIVE 02/08/2021 0942   PROTEINUR NEGATIVE 02/08/2021 0942   UROBILINOGEN 0.2 05/02/2020 0911   UROBILINOGEN 1.0 08/16/2014 1754   NITRITE NEGATIVE 02/08/2021 0942   LEUKOCYTESUR LARGE (A) 02/08/2021 0942    Sepsis Labs: Lactic Acid, Venous    Component Value Date/Time   LATICACIDVEN 1.4 02/08/2021 0352    RADIOLOGY STUDIES/RESULTS: No results found.   LOS: 3 days   Signature  Lala Lund M.D on 02/11/2021 at 10:06 AM   -  To page go to [email or web address]

## 2021-02-11 NOTE — Evaluation (Signed)
Occupational Therapy Evaluation Patient Details Name: [name] MRN: [id number] DOB: 03/07/1934 Today's Date: 02/11/2021   History of Present Illness pt is an 87 y/o female admitted 10/6 with complaints of worsening L foot pain.  Pt scheduled for an angiogram of pt's L LE for possible critical limb ischemia with further vascular surgical intervention as necessary 10/10.  PMHx:  afib, dCHF, severe tricuspid regurgitation, NICM s/p ICD, COPD, PAD, CKD3, RCC s/p nephrectomy, ischemic colitis.   Clinical Impression   [name] was mod I with use of a broom as a cane for mobility and ADLs prior to the above admission. She lives in a 1 level home, 1 STE with her grandson who is able to assist as needed. Upon evaluation pt reported increased L foot pain/burning. Pt was min guard - min A throughout session for all mobility and ADLs. She would benefit from continued OT acutely to progress towards indep prior to d/c home. Recommend d/c to home with supervision initially for all ADLs and mobility.      Recommendations for follow up therapy are one component of a multi-disciplinary discharge planning process, led by the attending physician.  Recommendations may be updated based on patient status, additional functional criteria and insurance authorization.   Follow Up Recommendations  No OT follow up;Supervision - Intermittent    Equipment Recommendations  None recommended by OT       Precautions / Restrictions Precautions Precautions: Fall Precaution Comments: watch o2 Restrictions Weight Bearing Restrictions: No      Mobility Bed Mobility Overal bed mobility: Needs Assistance Bed Mobility: Rolling;Sidelying to Sit;Sit to Supine Rolling: Min guard Sidelying to sit: Min guard   Sit to supine: Min guard   General bed mobility comments: min guard for safety only    Transfers Overall transfer level: Needs assistance   Transfers: Sit to/from Stand Sit to Stand: Min guard          General transfer comment: for safety    Balance Overall balance assessment: Needs assistance Sitting-balance support: Feet supported Sitting balance-Leahy Scale: Good     Standing balance support: Single extremity supported;During functional activity Standing balance-Leahy Scale: Fair                             ADL either performed or assessed with clinical judgement   ADL Overall ADL's : Needs assistance/impaired Eating/Feeding: Independent;Sitting   Grooming: Set up;Sitting   Upper Body Bathing: Set up;Sitting   Lower Body Bathing: Minimal assistance;Sit to/from stand   Upper Body Dressing : Set up;Sitting   Lower Body Dressing: Minimal assistance;Sit to/from stand Lower Body Dressing Details (indicate cue type and reason): pt abel to bend forward to reach bilat feet for lower body dressing Toilet Transfer: Ambulation;Min guard Toilet Transfer Details (indicate cue type and reason): min A for steadyign and safety Toileting- Clothing Manipulation and Hygiene: Supervision/safety;Sitting/lateral lean       Functional mobility during ADLs: Rolling walker;Min guard General ADL Comments: pt limited by L foot pain and O2; pt also had family visiting during session and did not want to do a whole lot so she could visit instead     Vision Baseline Vision/History: 0 No visual deficits Ability to See in Adequate Light: 0 Adequate Patient Visual Report: No change from baseline Vision Assessment?: No apparent visual deficits     Perception     Praxis      Pertinent Vitals/Pain Pain Assessment: Faces Pain Score: 4  Faces Pain Scale: Hurts even more Pain Location: L foot Pain Descriptors / Indicators: Burning Pain Intervention(s): Monitored during session;Limited activity within patient's tolerance     Hand Dominance Right   Extremity/Trunk Assessment Upper Extremity Assessment Upper Extremity Assessment: Overall WFL for tasks assessed;Generalized  weakness   Lower Extremity Assessment Lower Extremity Assessment: Defer to PT evaluation   Cervical / Trunk Assessment Cervical / Trunk Assessment: Normal   Communication Communication Communication: No difficulties   Cognition Arousal/Alertness: Awake/alert Behavior During Therapy: WFL for tasks assessed/performed Overall Cognitive Status: Within Functional Limits for tasks assessed                                     General Comments  VSS on 2.L nasal canula    Exercises     Shoulder Instructions      Home Living Family/patient expects to be discharged to:: Private residence Living Arrangements: Other relatives Available Help at Discharge: Family;Available PRN/intermittently Type of Home: House Home Access: Stairs to enter [employer] of Steps: 1   Home Layout: One level     Bathroom Shower/Tub: Teacher, early years/pre: Handicapped height Bathroom Accessibility: Yes   Home Equipment: Environmental consultant - 2 wheels;Cane - single point;Bedside commode;Grab bars - toilet;Grab bars - tub/shower;Hand held shower head   Additional Comments: Lives with great-grandson who works and goes to school, but able to assist pt as needed in evenings      Prior Functioning/Environment Level of Independence: Independent with assistive device(s)        Comments: Pt uses a broom to walk with (with the broom part really flattened out), drives short distances, does her own laundry-cooking-cleaning with great grandson assisting as needed. drives        OT Problem List: Decreased strength;Decreased range of motion;Decreased activity tolerance;Impaired balance (sitting and/or standing);Decreased safety awareness;Decreased knowledge of use of DME or AE;Decreased knowledge of precautions;Pain      OT Treatment/Interventions: Self-care/ADL training;Therapeutic exercise;DME and/or AE instruction;Therapeutic activities;Balance training;Patient/family education     OT Goals(Current goals can be found in the care plan section) Acute Rehab OT Goals Patient Stated Goal: I want to go home OT Goal Formulation: With patient Potential to Achieve Goals: Fair ADL Goals Pt Will Perform Grooming: with modified independence;standing Pt Will Perform Lower Body Bathing: with modified independence;sit to/from stand Pt Will Perform Lower Body Dressing: with modified independence;sit to/from stand Pt Will Transfer to Toilet: with modified independence;ambulating;regular height toilet Pt Will Perform Tub/Shower Transfer: with supervision;ambulating  OT Frequency: Min 2X/week   Barriers to D/C:            Co-evaluation              AM-PAC OT "6 Clicks" Daily Activity     Outcome Measure Help from another person eating meals?: None Help from another person taking care of personal grooming?: A Little Help from another person toileting, which includes using toliet, bedpan, or urinal?: A Little Help from another person bathing (including washing, rinsing, drying)?: A Little Help from another person to put on and taking off regular upper body clothing?: None Help from another person to put on and taking off regular lower body clothing?: A Little 6 Click Score: 20   End of Session Equipment Utilized During Treatment: Oxygen Nurse Communication: Mobility status;Precautions  Activity Tolerance: Patient tolerated treatment well Patient left: in bed;with call bell/phone within reach;with family/visitor present  OT  Visit Diagnosis: Unsteadiness on feet (R26.81);Other abnormalities of gait and mobility (R26.89);Repeated falls (R29.6);Pain                Time: 7340-3709 OT Time Calculation (min): 16 min Charges:  OT General Charges $OT Visit: 1 Visit OT Evaluation $OT Eval Moderate Complexity: 1 Mod   Kazuki Ingle A Levester Waldridge 02/11/2021, 3:35 PM

## 2021-02-11 NOTE — Evaluation (Signed)
Physical Therapy Evaluation Patient Details Name: [name] MRN: [id number] DOB: [birth year] Today's Date: 02/11/2021  History of Present Illness  pt is an 87 y/o female admitted 10/6 with complaints of worsening L foot pain.  Pt scheduled for an angiogram of pt's L LE for possible critical limb ischemia with further vascular surgical intervention as necessary 10/10.  PMHx:  afib, dCHF, severe tricuspid regurgitation, NICM s/p ICD, COPD, PAD, CKD3, RCC s/p nephrectomy, ischemic colitis.  Clinical Impression  Pt admitted with/for worsening foot pain.  Pt to have angiogram and possible further vascular intervention 10/10.  Presently needing light minimal assist for general mobilitya nd gait.Marland Kitchen  Pt currently limited functionally due to the problems listed below.  (see problems list.)  Pt will benefit from PT to maximize function and safety to be able to get home safely with available assist.        Recommendations for follow up therapy are one component of a multi-disciplinary discharge planning process, led by the attending physician.  Recommendations may be updated based on patient status, additional functional criteria and insurance authorization.  Follow Up Recommendations Home health PT (up to 24 hour as necessary initially)    Equipment Recommendations  None recommended by PT    Recommendations for Other Services       Precautions / Restrictions Precautions Precautions: Fall Precaution Comments: watch o2      Mobility  Bed Mobility Overal bed mobility: Needs Assistance Bed Mobility: Rolling;Sidelying to Sit;Sit to Supine Rolling: Min guard Sidelying to sit: Min guard   Sit to supine: Min assist        Transfers Overall transfer level: Needs assistance   Transfers: Sit to/from Stand Sit to Stand: Min assist         General transfer comment: minimal stability assist  Ambulation/Gait Ambulation/Gait assistance: Min assist Gait Distance (Feet): 45  Feet Assistive device: 1 person hand held assist Gait Pattern/deviations: Step-through pattern;Decreased step length - right;Decreased step length - left;Decreased stride length   Gait velocity interpretation: <1.8 ft/sec, indicate of risk for recurrent falls General Gait Details: tentative, mildly unsteady gait with/without HHA and occasional use of the rail in the hall.  Pt's sats started to drop significantly on RA with result that pt need solid minimal assist to return to the bed.  Sats dropped to low of 79% on RA, HR in the low 100's.  Rebounded well on 3L .  Stairs            Wheelchair Mobility    Modified Rankin (Stroke Patients Only)       Balance                                             Pertinent Vitals/Pain Pain Assessment: 0-10 Pain Score: 4  Pain Location: L foot Pain Descriptors / Indicators: Burning;Discomfort Pain Intervention(s): Monitored during session    Home Living Family/patient expects to be discharged to:: Private residence Living Arrangements: Other relatives Available Help at Discharge: Family;Available PRN/intermittently Type of Home: House Home Access: Stairs to enter   Entrance Stairs-Number of Steps: 1 Home Layout: One level Home Equipment: Walker - 2 wheels;Cane - single point;Bedside commode;Grab bars - toilet;Grab bars - tub/shower;Hand held shower head (uses broom as a cane) Additional Comments: Lives with great-grandson who works and goes to school, but able to assist pt as needed in evenings  Prior Function Level of Independence: Independent with assistive device(s)         Comments: Pt uses a broom to walk with (with the broom part really flattened out), drives short distances, does her own laundry-cooking-cleaning with great grandson assisting as needed. drives     Hand Dominance   Dominant Hand: Right    Extremity/Trunk Assessment   Upper Extremity Assessment Upper Extremity Assessment: Defer  to OT evaluation    Lower Extremity Assessment Lower Extremity Assessment: Generalized weakness    Cervical / Trunk Assessment Cervical / Trunk Assessment: Normal  Communication   Communication: No difficulties  Cognition Arousal/Alertness: Awake/alert Behavior During Therapy: WFL for tasks assessed/performed Overall Cognitive Status: Within Functional Limits for tasks assessed                                        General Comments      Exercises     Assessment/Plan    PT Assessment Patient needs continued PT services  PT Problem List Decreased strength;Decreased activity tolerance;Decreased balance;Decreased mobility;Cardiopulmonary status limiting activity       PT Treatment Interventions DME instruction;Gait training;Stair training;Functional mobility training;Therapeutic activities;Balance training;Patient/family education    PT Goals (Current goals can be found in the Care Plan section)  Acute Rehab PT Goals Patient Stated Goal: I want to go home PT Goal Formulation: With patient Time For Goal Achievement: 02/25/21 Potential to Achieve Goals: Good    Frequency Min 3X/week   Barriers to discharge        Co-evaluation               AM-PAC PT "6 Clicks" Mobility  Outcome Measure Help needed turning from your back to your side while in a flat bed without using bedrails?: A Little Help needed moving from lying on your back to sitting on the side of a flat bed without using bedrails?: A Little Help needed moving to and from a bed to a chair (including a wheelchair)?: A Little Help needed standing up from a chair using your arms (e.g., wheelchair or bedside chair)?: A Little Help needed to walk in hospital room?: A Little Help needed climbing 3-5 steps with a railing? : A Lot 6 Click Score: 17    End of Session   Activity Tolerance: Patient tolerated treatment well;Patient limited by fatigue Patient left: in bed;with call bell/phone  within reach;with family/visitor present Nurse Communication: Mobility status PT Visit Diagnosis: Unsteadiness on feet (R26.81);Other abnormalities of gait and mobility (R26.89);Difficulty in walking, not elsewhere classified (R26.2)    Time: 0131-4388 PT Time Calculation (min) (ACUTE ONLY): 29 min   Charges:   PT Evaluation $PT Eval Moderate Complexity: 1 Mod PT Treatments $Gait Training: 8-22 mins        02/11/2021  Ginger Carne., PT Acute Rehabilitation Services [phone]  (pager) [phone]  (office)  [name] 02/11/2021, 3:12 PM

## 2021-02-11 NOTE — Progress Notes (Signed)
PROGRESS NOTE FOR [facility]  Subjective: Feeling weak.  Objective: Vital signs in last 24 hours: Temp:  [97.7 F (36.5 C)] 97.7 F (36.5 C) (10/08 2229) Pulse Rate:  [60] 60 (10/08 2229) BP: (123-138)/(38-61) 138/61 (10/09 0822) SpO2:  [97 %] 97 % (10/08 2229) Last BM Date: 02/09/21  Intake/Output from previous day: 10/08 0701 - 10/09 0700 In: 599.6 [I.V.:100; IV Piggyback:499.6] Out: -  Intake/Output this shift: No intake/output data recorded.  General appearance: alert and no distress GI: soft, non-tender; bowel sounds normal; no masses,  no organomegaly  Lab Results: Recent Labs    02/10/21 1115 02/10/21 1824 02/11/21 0253  WBC 4.7 3.6* 4.3  HGB 9.5* 8.8* 9.1*  HCT 30.1* 28.6* 29.8*  PLT 127* 137* 157   BMET Recent Labs    02/09/21 0207 02/10/21 0210 02/11/21 0253  NA 131* 131* 134*  K 3.7 4.3 3.5  CL 97* 98 98  CO2 24 24 27   GLUCOSE 103* 98 117*  BUN 48* 51* 47*  CREATININE 1.89* 1.76* 1.48*  CALCIUM 8.1* 8.5* 8.6*   LFT Recent Labs    02/11/21 0253  PROT 6.1*  ALBUMIN 3.2*  AST 50*  ALT 34  ALKPHOS 58  BILITOT 0.9   PT/INR No results for input(s): LABPROT, INR in the last 72 hours. Hepatitis Panel No results for input(s): HEPBSAG, HCVAB, HEPAIGM, HEPBIGM in the last 72 hours. C-Diff No results for input(s): CDIFFTOX in the last 72 hours. Fecal Lactopherrin No results for input(s): FECLLACTOFRN in the last 72 hours.  Studies/Results: No results found.  Medications: Scheduled:  ezetimibe  10 mg Oral Daily   levothyroxine  25 mcg Oral Q0600   lidocaine  2 patch Transdermal Q24H   midodrine  5 mg Oral TID WC   pantoprazole (PROTONIX) IV  40 mg Intravenous Q12H   rosuvastatin  40 mg Oral Daily   sodium chloride flush  3 mL Intravenous Q12H   torsemide  40 mg Oral BID   traMADol  50 mg Oral Once   Continuous:  ceFEPime (MAXIPIME) IV 2 g (02/11/21 1000)   vancomycin Stopped (02/10/21 1838)    Assessment/Plan: 1) Upper GI AVMs  s/p APC. 2) Anemia - improved.   The patient's HGB increased mildly.  She is feeling weak, but she is stable.  Plan: 1) Follow HGB and transfuse as necessary. 2) Discontinue PPI. 3) Signing off.  If there are any questions please direct them to [facility].  LOS: 3 days   Orin Eberwein D 02/11/2021, 11:40 AM

## 2021-02-11 NOTE — Progress Notes (Signed)
  Daily Progress Note   Patient scheduled for angiogram tomorrow for [place]uther[place] 5 critical limb ischemia in the left leg. Please ensure n.p.o., holding Eliquis.  I will stop by later today to discuss the risks and benefits with [name], and answer any and all questions.   Cassandria Santee MD MS [facility] [phone] 02/11/2021  7:05 AM

## 2021-02-12 ENCOUNTER — Encounter (HOSPITAL_COMMUNITY): Payer: Self-pay | Admitting: Vascular Surgery

## 2021-02-12 ENCOUNTER — Encounter (HOSPITAL_COMMUNITY)
Admission: EM | Disposition: A | Discharge status: Home Health | Payer: Self-pay | Source: Home / Self Care | Attending: Internal Medicine

## 2021-02-12 DIAGNOSIS — I70244 Atherosclerosis of native arteries of left leg with ulceration of heel and midfoot: Secondary | ICD-10-CM

## 2021-02-12 HISTORY — PX: ABDOMINAL AORTOGRAM W/LOWER EXTREMITY: CATH118223

## 2021-02-12 HISTORY — PX: PERIPHERAL VASCULAR INTERVENTION: CATH118257

## 2021-02-12 LAB — CBC WITH DIFFERENTIAL/PLATELET
Abs Immature Granulocytes: 0.02 10*3/uL (ref 0.00–0.07)
Basophils Absolute: 0 10*3/uL (ref 0.0–0.1)
Basophils Relative: 1 %
Eosinophils Absolute: 0.1 10*3/uL (ref 0.0–0.5)
Eosinophils Relative: 1 %
HCT: 28.9 % — ABNORMAL LOW (ref 36.0–46.0)
Hemoglobin: 8.7 g/dL — ABNORMAL LOW (ref 12.0–15.0)
Immature Granulocytes: 1 %
Lymphocytes Relative: 12 %
Lymphs Abs: 0.5 10*3/uL — ABNORMAL LOW (ref 0.7–4.0)
MCH: 28.1 pg (ref 26.0–34.0)
MCHC: 30.1 g/dL (ref 30.0–36.0)
MCV: 93.2 fL (ref 80.0–100.0)
Monocytes Absolute: 0.6 10*3/uL (ref 0.1–1.0)
Monocytes Relative: 17 %
Neutro Abs: 2.5 10*3/uL (ref 1.7–7.7)
Neutrophils Relative %: 68 %
Platelets: 154 10*3/uL (ref 150–400)
RBC: 3.1 MIL/uL — ABNORMAL LOW (ref 3.87–5.11)
RDW: 15.9 % — ABNORMAL HIGH (ref 11.5–15.5)
WBC: 3.6 10*3/uL — ABNORMAL LOW (ref 4.0–10.5)
nRBC: 0 % (ref 0.0–0.2)

## 2021-02-12 LAB — MAGNESIUM: Magnesium: 1.9 mg/dL (ref 1.7–2.4)

## 2021-02-12 LAB — COMPREHENSIVE METABOLIC PANEL
ALT: 29 U/L (ref 0–44)
AST: 36 U/L (ref 15–41)
Albumin: 3 g/dL — ABNORMAL LOW (ref 3.5–5.0)
Alkaline Phosphatase: 50 U/L (ref 38–126)
Anion gap: 8 (ref 5–15)
BUN: 41 mg/dL — ABNORMAL HIGH (ref 8–23)
CO2: 32 mmol/L (ref 22–32)
Calcium: 8.8 mg/dL — ABNORMAL LOW (ref 8.9–10.3)
Chloride: 99 mmol/L (ref 98–111)
Creatinine, Ser: 1.61 mg/dL — ABNORMAL HIGH (ref 0.44–1.00)
GFR, Estimated: 31 mL/min — ABNORMAL LOW (ref >60)
Glucose, Bld: 100 mg/dL — ABNORMAL HIGH (ref 70–99)
Potassium: 4.1 mmol/L (ref 3.5–5.1)
Sodium: 139 mmol/L (ref 135–145)
Total Bilirubin: 1.1 mg/dL (ref 0.3–1.2)
Total Protein: 5.9 g/dL — ABNORMAL LOW (ref 6.5–8.1)

## 2021-02-12 LAB — CBC
HCT: 30.2 % — ABNORMAL LOW (ref 36.0–46.0)
Hemoglobin: 9.2 g/dL — ABNORMAL LOW (ref 12.0–15.0)
MCH: 28.5 pg (ref 26.0–34.0)
MCHC: 30.5 g/dL (ref 30.0–36.0)
MCV: 93.5 fL (ref 80.0–100.0)
Platelets: 158 10*3/uL (ref 150–400)
RBC: 3.23 MIL/uL — ABNORMAL LOW (ref 3.87–5.11)
RDW: 15.9 % — ABNORMAL HIGH (ref 11.5–15.5)
WBC: 3.5 10*3/uL — ABNORMAL LOW (ref 4.0–10.5)
nRBC: 0 % (ref 0.0–0.2)

## 2021-02-12 LAB — C-REACTIVE PROTEIN: CRP: 11.2 mg/dL — ABNORMAL HIGH (ref <1.0)

## 2021-02-12 LAB — PROCALCITONIN: Procalcitonin: 5.47 ng/mL

## 2021-02-12 LAB — CREATININE, SERUM
Creatinine, Ser: 1.57 mg/dL — ABNORMAL HIGH (ref 0.44–1.00)
GFR, Estimated: 32 mL/min — ABNORMAL LOW (ref >60)

## 2021-02-12 LAB — GLUCOSE, CAPILLARY: Glucose-Capillary: 83 mg/dL (ref 70–99)

## 2021-02-12 LAB — BRAIN NATRIURETIC PEPTIDE: B Natriuretic Peptide: 551.8 pg/mL — ABNORMAL HIGH (ref 0.0–100.0)

## 2021-02-12 SURGERY — ABDOMINAL AORTOGRAM W/LOWER EXTREMITY
Anesthesia: LOCAL

## 2021-02-12 SURGERY — LOWER EXTREMITY ANGIOGRAPHY
Anesthesia: LOCAL

## 2021-02-12 MED ORDER — ONDANSETRON HCL 4 MG/2ML IJ SOLN
4.0000 mg | Freq: Four times a day (QID) | INTRAMUSCULAR | Status: DC | PRN
  Filled 2021-02-12: qty 2

## 2021-02-12 MED ORDER — LABETALOL HCL 5 MG/ML IV SOLN
10.0000 mg | INTRAVENOUS | Status: DC | PRN
Start: 2021-02-12 — End: 2021-02-15

## 2021-02-12 MED ORDER — SODIUM CHLORIDE 0.9% FLUSH: 3.0000 mL | INTRAVENOUS | Status: DC | PRN

## 2021-02-12 MED ORDER — SODIUM CHLORIDE 0.9% FLUSH
3.0000 mL | Freq: Two times a day (BID) | INTRAVENOUS | Status: DC
  Administered 2021-02-12 (×2): 3 mL via INTRAVENOUS

## 2021-02-12 MED ORDER — SODIUM CHLORIDE 0.9 % IV SOLN: 250.0000 mL | INTRAVENOUS | Status: DC | PRN

## 2021-02-12 MED ORDER — ASPIRIN 81 MG PO CHEW
CHEWABLE_TABLET | ORAL | Status: DC | PRN
  Administered 2021-02-12: 81 mg via ORAL

## 2021-02-12 MED ORDER — HEPARIN (PORCINE) IN NACL 1000-0.9 UT/500ML-% IV SOLN
INTRAVENOUS | Status: AC
  Filled 2021-02-12: qty 1000

## 2021-02-12 MED ORDER — VANCOMYCIN HCL 500 MG/100ML IV SOLN
500.0000 mg | Freq: Once | INTRAVENOUS | Status: AC
  Administered 2021-02-12: 500 mg via INTRAVENOUS
  Filled 2021-02-12 (×2): qty 100

## 2021-02-12 MED ORDER — HEPARIN SODIUM (PORCINE) 1000 UNIT/ML IJ SOLN
INTRAMUSCULAR | Status: DC | PRN
  Administered 2021-02-12: 5000 [IU] via INTRAVENOUS

## 2021-02-12 MED ORDER — SODIUM CHLORIDE 0.9 % IV SOLN
2.0000 g | INTRAVENOUS | Status: DC
  Administered 2021-02-13 – 2021-02-15 (×3): 2 g via INTRAVENOUS
  Filled 2021-02-12 (×4): qty 20

## 2021-02-12 MED ORDER — LIDOCAINE HCL (PF) 1 % IJ SOLN
INTRAMUSCULAR | Status: AC
  Filled 2021-02-12: qty 30

## 2021-02-12 MED ORDER — IODIXANOL 320 MG/ML IV SOLN
INTRAVENOUS | Status: DC | PRN
  Administered 2021-02-12: 40 mL via INTRA_ARTERIAL

## 2021-02-12 MED ORDER — SODIUM CHLORIDE 0.9 % IV SOLN: INTRAVENOUS | Status: DC

## 2021-02-12 MED ORDER — LIDOCAINE HCL (PF) 1 % IJ SOLN
INTRAMUSCULAR | Status: DC | PRN
  Administered 2021-02-12: 25 mL via INTRADERMAL

## 2021-02-12 MED ORDER — APIXABAN 2.5 MG PO TABS
2.5000 mg | ORAL_TABLET | Freq: Two times a day (BID) | ORAL | Status: DC
  Administered 2021-02-13 – 2021-02-15 (×5): 2.5 mg via ORAL
  Filled 2021-02-12 (×6): qty 1

## 2021-02-12 MED ORDER — ACETAMINOPHEN 325 MG PO TABS: 650.0000 mg | ORAL_TABLET | ORAL | Status: DC | PRN

## 2021-02-12 MED ORDER — ASPIRIN 81 MG PO CHEW
CHEWABLE_TABLET | ORAL | Status: AC
  Filled 2021-02-12: qty 1

## 2021-02-12 MED ORDER — HYDRALAZINE HCL 20 MG/ML IJ SOLN
5.0000 mg | INTRAMUSCULAR | Status: DC | PRN
Start: 2021-02-12 — End: 2021-02-15

## 2021-02-12 MED ORDER — HEPARIN (PORCINE) IN NACL 1000-0.9 UT/500ML-% IV SOLN
INTRAVENOUS | Status: DC | PRN
  Administered 2021-02-12 (×2): 500 mL

## 2021-02-12 MED ORDER — HEPARIN SODIUM (PORCINE) 1000 UNIT/ML IJ SOLN
INTRAMUSCULAR | Status: AC
  Filled 2021-02-12: qty 1

## 2021-02-12 MED ORDER — SODIUM CHLORIDE 0.9 % IV SOLN: INTRAVENOUS | Status: AC

## 2021-02-12 MED ORDER — ASPIRIN EC 81 MG PO TBEC
81.0000 mg | DELAYED_RELEASE_TABLET | Freq: Every day | ORAL | Status: DC
  Administered 2021-02-12 – 2021-02-15 (×4): 81 mg via ORAL
  Filled 2021-02-12 (×4): qty 1

## 2021-02-12 MED ORDER — SODIUM CHLORIDE 0.9 % IV SOLN
2.0000 g | Freq: Once | INTRAVENOUS | Status: AC
  Administered 2021-02-12: 2 g via INTRAVENOUS

## 2021-02-12 MED ORDER — APIXABAN 2.5 MG PO TABS
2.5000 mg | ORAL_TABLET | Freq: Two times a day (BID) | ORAL | Status: DC
  Filled 2021-02-12: qty 1

## 2021-02-12 MED ORDER — HEPARIN SODIUM (PORCINE) 5000 UNIT/ML IJ SOLN
5000.0000 [IU] | Freq: Three times a day (TID) | INTRAMUSCULAR | Status: AC
  Administered 2021-02-12: 5000 [IU] via SUBCUTANEOUS
  Filled 2021-02-12: qty 1

## 2021-02-12 MED ORDER — MIDAZOLAM HCL 2 MG/2ML IJ SOLN
INTRAMUSCULAR | Status: AC
  Filled 2021-02-12: qty 2

## 2021-02-12 MED ORDER — MIDAZOLAM HCL 2 MG/2ML IJ SOLN
INTRAMUSCULAR | Status: DC | PRN
  Administered 2021-02-12 (×2): .5 mg via INTRAVENOUS

## 2021-02-12 SURGICAL SUPPLY — 16 items
CATH ANGIO 5F PIGTAIL 65CM (CATHETERS) ×1 IMPLANT
CATH STRAIGHT 5FR 65CM (CATHETERS) ×1 IMPLANT
CLOSURE MYNX CONTROL 6F/7F (Vascular Products) ×2 IMPLANT
COVER DOME SNAP 22 D (MISCELLANEOUS) ×1 IMPLANT
KIT ANGIASSIST CO2 SYSTEM (KITS) ×1 IMPLANT
KIT ENCORE 26 ADVANTAGE (KITS) ×2 IMPLANT
KIT MICROPUNCTURE NIT STIFF (SHEATH) ×1 IMPLANT
KIT PV (KITS) ×3 IMPLANT
SHEATH BRITE TIP 7FR 35CM (SHEATH) ×2 IMPLANT
SHEATH PINNACLE 5F 10CM (SHEATH) ×1 IMPLANT
SHEATH PINNACLE 7F 10CM (SHEATH) ×2 IMPLANT
STENT VIABAHN 8X29X80 VBX (Permanent Stent) ×2 IMPLANT
TRANSDUCER W/STOPCOCK (MISCELLANEOUS) ×3 IMPLANT
TRAY PV CATH (CUSTOM PROCEDURE TRAY) ×3 IMPLANT
WIRE BENTSON .035X145CM (WIRE) ×1 IMPLANT
WIRE ROSEN-J .035X180CM (WIRE) ×2 IMPLANT

## 2021-02-12 NOTE — Progress Notes (Signed)
Pt arrived from cath lab, VSS, CHG complete, oriented to unit, groins level 0, call light within reach will continue to monitor.  Chrisandra Carota, RN 02/12/2021 2:02 PM

## 2021-02-12 NOTE — Progress Notes (Signed)
PROGRESS NOTE        PATIENT DETAILS Name: [name] Age: 87 y.o. Sex: female Date of Birth: Sep 19, 1933 Admit Date: 02/08/2021 Admitting Physician Norval Morton, MD MVE:[facility], Jobe Marker, MD  Brief Narrative: Patient is a 87 y.o. female with history of A. 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  See below for further details.  Subjective: Patient in bed, appears comfortable, denies any headache, no fever, no chest pain or pressure, no shortness of breath , no abdominal pain. No new focal weakness.     Objective: Vitals: Blood pressure (!) 125/57, pulse 62, temperature 98.2 F (36.8 C), resp. rate 18, height 5' 6.5" (1.689 m), weight 70.9 kg, SpO2 100 %.   Exam:  Awake Alert, No new F.N deficits, Normal affect Burke.AT,PERRAL Supple Neck, No JVD,   Symmetrical Chest wall movement, Good air movement bilaterally, CTAB RRR,No Gallops, Rubs or new Murmurs, No Parasternal Heave +ve B.Sounds, Abd Soft, No tenderness,    Skin: L Leg as below     Pertinent Labs/Radiology: WBC: 7.2 Hb: 8.7 Na: 131 K: 3.7 Creatinine: 1.89  10/6>>Blood culture: Pending 10/6>> x-ray left ankle: Diffuse edema-no osteomyelitis. 10/ 8EGD  Impression:               - Normal esophagus.                           - A few non-bleeding angiodysplastic lesions in the                            stomach. Treated with a monopolar probe.                           - Four bleeding angiodysplastic lesions in the                            duodenum. Treated with a monopolar probe.                           - A single non-bleeding angiodysplastic lesion in                            the jejunum. Treated with a monopolar  probe.                           - No specimens collected. Recommendation:           - Return patient to hospital ward for ongoing care.                           - Resume regular diet.                           - Restart Eliquis in 3 days.                           -  Follow HGB and transfuse as necessary.  Assessment/Plan:  Sepsis due to left leg cellulitis superimposed on underlying squamous cell carcinoma: Sepsis physiology slowly improving-continue empiric antibiotics-await cultures.  Vascular surgery planning angiogram on 10/10, negative MRSA Nasal PCR.  Hypotension: on low dose midodrine.  Melena-likely slow GI bleeding-with acute on chronic blood loss anemia: Having melanotic stools-2 g drop in hemoglobin-seen by GI, post EGD 02/10/21 - few non-bleeding angiodysplastic lesions in the stomach. Treated with a monopolar probe. Four bleeding angiodysplastic lesions in the duodenum. Treated with a monopolar probe. A single non-bleeding angiodysplastic lesion in the jejunum. Treated with a monopolar probe, continue PPI, resume Eliquis on 02/14/2021 per GI. Monitor H&H.  PAD: Vascular surgery following-angiogram planned for 10/10-after Eliquis washout.  Acute hypoxic respiratory failure due to COPD exacerbation: Improved-minimal wheezing present today-continue bronchodilators.  Attempt to titrate off oxygen.  CKD stage IIIb: Creatinine slightly elevated-also developing some leg edema, resumed less than half home dose diuretic on 02/10/2021.  Chronic atrial fibrillation: Paced rhythm-Eliquis on hold  HFrEF: Euvolemic on exam-her EF has recovered-she has ICD in place.  HTN: BP improved on low-dose midodrine.  HLD: Continue statin  Hypothyroidism: Continue Synthroid  GERD: Continue PPI  Squamous cell carcinoma involving left leg ulcer: Stable for outpatient follow-up with dermatology  Hyperuricemia - follow with PCP, doubt any acute issue.    Consults: GI,  DVT Prophylaxis:  SCD's Code Status:Full code  Family Communication: None at bedside  Time spent: 35 minutes-Greater than 50% of this time was spent in counseling, explanation of diagnosis, planning of further management, and coordination of care.  Diet: Diet Order             Diet NPO time specified Except for: Sips with Meds  Diet effective midnight                    Disposition Plan: Status is: Inpatient  Remains inpatient appropriate because:Inpatient level of care appropriate due to severity of illness  Dispo: The patient is from: Home              Anticipated d/c is to: Home              Patient currently is not medically stable to d/c.   Difficult to place patient No     Barriers to Discharge: Resolving sepsis physiology-possible GI bleeding-not yet stable for discharge.  Antimicrobial agents: Anti-infectives (From admission, onward)    Start     Dose/Rate Route Frequency Ordered Stop   02/10/21 1000  vancomycin (VANCOREADY) IVPB 1250 mg/250 mL  Status:  Discontinued        1,250 mg 166.7 mL/hr over 90 Minutes Intravenous Every 48 hours 02/08/21 1346 02/09/21 1425   02/10/21 1000  vancomycin (VANCOCIN) IVPB 1000 mg/200 mL premix        1,000 mg 200 mL/hr over 60 Minutes Intravenous Every 48 hours 02/09/21 1425     02/08/21 1130  ceFEPIme (MAXIPIME) 2 g in sodium chloride 0.9 % 100 mL IVPB        2 g 200 mL/hr over 30 Minutes Intravenous Every 24 hours 02/08/21 1112     02/08/21 1115  vancomycin (VANCOREADY) IVPB 1250 mg/250 mL        1,250 mg 166.7 mL/hr over 90 Minutes Intravenous  Once 02/08/21 1112 02/08/21 1509   02/08/21 0000  cephALEXin (KEFLEX) 500 MG capsule        500 mg Oral 2 times daily 02/08/21 0719  02/08/21 0000  doxycycline (VIBRAMYCIN) 100 MG capsule        100 mg Oral 2 times daily 02/08/21 0719          MEDICATIONS: Scheduled Meds:  ezetimibe  10 mg Oral Daily   levothyroxine  25 mcg Oral Q0600   lidocaine  2 patch Transdermal Q24H    midodrine  5 mg Oral TID WC   pantoprazole (PROTONIX) IV  40 mg Intravenous Q12H   rosuvastatin  40 mg Oral Daily   sodium chloride flush  3 mL Intravenous Q12H   torsemide  40 mg Oral BID   traMADol  50 mg Oral Once   Continuous Infusions:  sodium chloride Stopped (02/12/21 0509)   ceFEPime (MAXIPIME) IV Stopped (02/11/21 1035)   vancomycin Stopped (02/10/21 1838)   PRN Meds:.acetaminophen **OR** acetaminophen, albuterol, fluticasone, ondansetron **OR** ondansetron (ZOFRAN) IV, traMADol   I have personally reviewed following labs and imaging studies  LABORATORY DATA: CBC: Recent Labs  Lab 02/08/21 0156 02/09/21 0207 02/10/21 0210 02/10/21 1115 02/10/21 1824 02/11/21 0253 02/12/21 0232  WBC 9.1   < > 5.7 4.7 3.6* 4.3 3.6*  NEUTROABS 8.0*  --   --   --   --  3.4 2.5  HGB 10.2*   < > 8.6* 9.5* 8.8* 9.1* 8.7*  HCT 32.3*   < > 27.6* 30.1* 28.6* 29.8* 28.9*  MCV 91.8   < > 91.4 91.2 91.7 92.5 93.2  PLT 172   < > 129* 127* 137* 157 154   < > = values in this interval not displayed.    Basic Metabolic Panel: Recent Labs  Lab 02/08/21 0156 02/09/21 0207 02/10/21 0210 02/11/21 0253 02/12/21 0232  NA 135 131* 131* 134* 139  K 3.9 3.7 4.3 3.5 4.1  CL 96* 97* 98 98 99  CO2 27 24 24 27  32  GLUCOSE 124* 103* 98 117* 100*  BUN 39* 48* 51* 47* 41*  CREATININE 1.63* 1.89* 1.76* 1.48* 1.61*  CALCIUM 9.5 8.1* 8.5* 8.6* 8.8*  MG  --   --   --  2.0 1.9    GFR: Estimated Creatinine Clearance: 24.8 mL/min (A) (by C-G formula based on SCr of 1.61 mg/dL (H)).  Liver Function Tests: Recent Labs  Lab 02/08/21 0156 02/11/21 0253 02/12/21 0232  AST 18 50* 36  ALT 15 34 29  ALKPHOS 46 58 50  BILITOT 0.9 0.9 1.1  PROT 6.6 6.1* 5.9*  ALBUMIN 4.0 3.2* 3.0*   No results for input(s): LIPASE, AMYLASE in the last 168 hours. No results for input(s): AMMONIA in the last 168 hours.  Coagulation Profile: No results for input(s): INR, PROTIME in the last 168 hours.  Cardiac  Enzymes: No results for input(s): CKTOTAL, CKMB, CKMBINDEX, TROPONINI in the last 168 hours.  BNP (last 3 results) No results for input(s): PROBNP in the last 8760 hours.  Lipid Profile: No results for input(s): CHOL, HDL, LDLCALC, TRIG, CHOLHDL, LDLDIRECT in the last 72 hours.   Thyroid Function Tests: No results for input(s): TSH, T4TOTAL, FREET4, T3FREE, THYROIDAB in the last 72 hours.  Anemia Panel: No results for input(s): VITAMINB12, FOLATE, FERRITIN, TIBC, IRON, RETICCTPCT in the last 72 hours.  Urine analysis:    Component Value Date/Time   COLORURINE YELLOW 02/08/2021 0942   APPEARANCEUR CLEAR 02/08/2021 0942   APPEARANCEUR Cloudy (A) 01/27/2018 1330   LABSPEC 1.018 02/08/2021 0942   PHURINE 5.0 02/08/2021 [place] 02/08/2021 [place] 02/08/2021 8676  HGBUR large 11/16/2009 1202   BILIRUBINUR NEGATIVE 02/08/2021 0942   BILIRUBINUR neg 05/02/2020 0911   BILIRUBINUR Negative 01/27/2018 1330   KETONESUR NEGATIVE 02/08/2021 0942   PROTEINUR NEGATIVE 02/08/2021 0942   UROBILINOGEN 0.2 05/02/2020 0911   UROBILINOGEN 1.0 08/16/2014 1754   NITRITE NEGATIVE 02/08/2021 0942   LEUKOCYTESUR LARGE (A) 02/08/2021 0942    Sepsis Labs: Lactic Acid, Venous    Component Value Date/Time   LATICACIDVEN 1.4 02/08/2021 0352    RADIOLOGY STUDIES/RESULTS: No results found.   LOS: 4 days   Signature  Lala Lund M.D on 02/12/2021 at 9:32 AM   -  To page go to [email or web address]

## 2021-02-12 NOTE — Progress Notes (Signed)
Pharmacy Antibiotic Note  ANALIESE KRUPKA is a 87 y.o. female admitted on 02/08/2021 with SSTI and osteomyelitis .    Pt has been on abx since 10/6. Cultures remained neg so far. D/w Dr. Candiss Norse and we will optimize her abx to ceftriaxone to avoid risk of AKI. Will give one small dose vanc today.   Plan: Vanc 500mg  IV x1 Dc cefepime Ceftriaxone 2g IV q24  Height: 5' 6.5" (168.9 cm) Weight: 70.9 kg (156 lb 4.9 oz) IBW/kg (Calculated) : 60.45  Temp (24hrs), Avg:98.5 F (36.9 C), Min:97.9 F (36.6 C), Max:98.9 F (37.2 C)  Recent Labs  Lab 02/08/21 0155 02/08/21 0156 02/08/21 0156 02/08/21 0352 02/09/21 0207 02/09/21 1105 02/10/21 0210 02/10/21 1115 02/10/21 1824 02/11/21 0253 02/12/21 0232  WBC  --  9.1   < >  --  7.2   < > 5.7 4.7 3.6* 4.3 3.6*  CREATININE  --  1.63*  --   --  1.89*  --  1.76*  --   --  1.48* 1.61*  LATICACIDVEN 1.5  --   --  1.4  --   --   --   --   --   --   --    < > = values in this interval not displayed.     Estimated Creatinine Clearance: 24.8 mL/min (A) (by C-G formula based on SCr of 1.61 mg/dL (H)).    Allergies  Allergen Reactions   Dilaudid [Hydromorphone] Other (See Comments)    Excessive Somnolence- Required Narcan   Fentanyl Other (See Comments)    Required Narcan when given with Dilaudid. Tolerated single dose when given during procedure 02/03/2019.    Codeine Nausea And Vomiting    Hallucinations    Morphine And Related Nausea And Vomiting    Hallucinations    Whole Blood Itching and Nausea And Vomiting    Pt and family stated pt will receive blood products but had a reaction requiring benadryl on a previous administration.    Sulfonamide Derivatives Other (See Comments)    Dry mouth    Antimicrobials this admission: Vanc 10/6 >> 10/10 Cefepime 10/6 >> 10/10 Ceftriaxone 10/11>>  Dose adjustments this admission:  Microbiology results: 10/6 BCx: ngtd MRSA neg  Onnie Boer, PharmD, BCIDP, AAHIVP, CPP Infectious Disease  Pharmacist 02/12/2021 10:05 AM

## 2021-02-12 NOTE — Discharge Instructions (Addendum)
Follow with Primary MD Lesleigh Noe, MD in 7 days   Get CBC, CMP, 2 view Chest X ray -  checked next visit within 1 week by Primary MD   Activity: As tolerated with Full fall precautions use walker/cane & assistance as needed  Disposition Home    Diet: Heart Healthy with 1.5 lit/day fluid restriction.  Check your Weight same time everyday, if you gain over 2 pounds, or you develop in leg swelling, experience more shortness of breath or chest pain, call your Primary MD immediately. Follow Cardiac Low Salt Diet and 1.5 lit/day fluid restriction.  Special Instructions: If you have smoked or chewed Tobacco  in the last 2 yrs please stop smoking, stop any regular Alcohol  and or any Recreational drug use.  On your next visit with your primary care physician please Get Medicines reviewed and adjusted.  Please request your Prim.MD to go over all Hospital Tests and Procedure/Radiological results at the follow up, please get all Hospital records sent to your Prim MD by signing hospital release before you go home.  If you experience worsening of your admission symptoms, develop shortness of breath, life threatening emergency, suicidal or homicidal thoughts you must seek medical attention immediately by calling 911 or calling your MD immediately  if symptoms less severe.  You Must read complete instructions/literature along with all the possible adverse reactions/side effects for all the Medicines you take and that have been prescribed to you. Take any new Medicines after you have completely understood and accpet all the possible adverse reactions/side effects.     Information on my medicine - ELIQUIS (apixaban)  This medication education was reviewed with me or my healthcare representative as part of my discharge preparation.  The pharmacist that spoke with me during my hospital stay was:    Why was Eliquis prescribed for you? Eliquis was prescribed for you to reduce the risk of a blood clot  forming that can cause a stroke if you have a medical condition called atrial fibrillation (a type of irregular heartbeat).  What do You need to know about Eliquis ? Take your Eliquis TWICE DAILY - one tablet in the morning and one tablet in the evening with or without food. If you have difficulty swallowing the tablet whole please discuss with your pharmacist how to take the medication safely.  Take Eliquis exactly as prescribed by your doctor and DO NOT stop taking Eliquis without talking to the doctor who prescribed the medication.  Stopping may increase your risk of developing a stroke.  Refill your prescription before you run out.  After discharge, you should have regular check-up appointments with your healthcare provider that is prescribing your Eliquis.  In the future your dose may need to be changed if your kidney function or weight changes by a significant amount or as you get older.  What do you do if you miss a dose? If you miss a dose, take it as soon as you remember on the same day and resume taking twice daily.  Do not take more than one dose of ELIQUIS at the same time to make up a missed dose.  Important Safety Information A possible side effect of Eliquis is bleeding. You should call your healthcare provider right away if you experience any of the following: Bleeding from an injury or your nose that does not stop. Unusual colored urine (red or dark brown) or unusual colored stools (red or black). Unusual bruising for unknown reasons. A serious fall  or if you hit your head (even if there is no bleeding).  Some medicines may interact with Eliquis and might increase your risk of bleeding or clotting while on Eliquis. To help avoid this, consult your healthcare provider or pharmacist prior to using any new prescription or non-prescription medications, including herbals, vitamins, non-steroidal anti-inflammatory drugs (NSAIDs) and supplements.  This website has more  information on Eliquis (apixaban): http://[email or web address].com/eliquis/home   [facility]  Discharge Instructions  Lower Extremity Angiogram; Angioplasty/Stenting  Please refer to the following instructions for your post-procedure care. Your surgeon or physician assistant will discuss any changes with you.  Activity  Avoid lifting more than 8 pounds (1 gallons of milk) for 5 days after your procedure. You may walk as much as you can tolerate. It's OK to drive after 72 hours.  Bathing/Showering  You may shower the day after your procedure. If you have a bandage, you may remove it at 24- 48 hours. Clean your incision site with mild soap and water. Pat the area dry with a clean towel.  Diet  Resume your pre-procedure diet. There are no special food restrictions following this procedure. All patients with peripheral vascular disease should follow a low fat/low cholesterol diet. In order to heal from your surgery, it is CRITICAL to get adequate nutrition. Your body requires vitamins, minerals, and protein. Vegetables are the best source of vitamins and minerals. Vegetables also provide the perfect balance of protein. Processed food has little nutritional value, so try to avoid this.  Medications  Resume taking all of your medications unless your doctor tells you not to. If your incision is causing pain, you may take over-the-counter pain relievers such as acetaminophen (Tylenol)  Follow Up  Follow up will be arranged at the time of your procedure. You may have an office visit scheduled or may be scheduled for surgery. Ask your surgeon if you have any questions.  Please call us immediately for any of the following conditions: Severe or worsening pain your legs or feet at rest or with walking. Increased pain, redness, drainage at your groin puncture site. Fever of 101 degrees or higher. If you have any mild or slow bleeding from your puncture site: lie down,  apply firm constant pressure over the area with a piece of gauze or a clean wash cloth for 30 minutes- no peeking!, call 911 right away if you are still bleeding after 30 minutes, or if the bleeding is heavy and unmanageable.  Reduce your risk factors of vascular disease:  Stop smoking. If you would like help call [facility] at 1-800-QUIT-NOW (434)721-8769) or [place] at 445-506-7297. Manage your cholesterol Maintain a desired weight Control your diabetes Keep your blood pressure down  If you have any questions, please call the office at [phone]

## 2021-02-12 NOTE — Plan of Care (Signed)

## 2021-02-12 NOTE — Op Note (Signed)
    Patient name: [name] MRN: [id number] DOB: 09/02/1933 Sex: female  02/12/2021 Pre-operative Diagnosis: Chronic limb threatening ischemia with ulceration left lower extremity Post-operative diagnosis:  Same Surgeon:  Erlene Quan C. Donzetta Matters, MD Procedure Performed: 1.  Ultrasound-guided cannulation bilateral common femoral arteries 2.  CO2 aortogram 3.  Selection of left common femoral artery and left lower extremity angiogram 4.  Bilateral common iliac artery stenting with 8 x 29 mm VBX 5.  Mynx device closure bilateral common femoral arteries 6.  Moderate sedation with Versed for 59 minutes   Indications: 87 year old female with a history of squamous cell carcinoma of her left foot.  She underwent biopsy which is failed to heal she has chronic limb threatening ischemia she is indicated for angiography with possible intervention.  She does have elevated creatinine we are planning to use CO2.  Findings: Bilateral common femoral arteries were small but free of disease.  The aorta was approximately patent both renal arteries appeared to fill with CO2.  The IMA was large.  Distally the aorta tapered down with a napkin ring type high-grade stenosis.  This was at least 80 to 90% stenosis.  Bilateral common iliac arteries then were patent.  We selected a left common femoral artery and there was what appeared to be inline flow with dominant via the posterior tibial artery with CO2.  After stenting the high-grade stenosis of the distal aorta is resolved.  The bifurcation was raised.  There was no gradient from the aorta to the bilateral common femoral sheaths at completion.   Procedure:  The patient was identified in the holding area and taken to room 8.  The patient was then placed supine on the table and prepped and draped in the usual sterile fashion.  A time out was called.  Ultrasound was used to evaluate the first right common femoral artery.  There is anesthetized 1% lidocaine cannulated  micropuncture needle followed by wire and sheath.  We placed a 5 French sheath and Bentson wire and pigtail catheter into the aorta.  CO2 aortogram was performed.  We then crossed the bifurcation placed a straight catheter perform left lower extremity angiography with CO2.  With the above findings we elected to intervene on the tight distal aortic stenosis.  We replaced the pigtail catheter.  We performed contrasted angiography.  We then evaluated the left common femoral artery.  This was noted to be patent compressible.  The areas Him stenotic and cannulated micropuncture needle followed by wire and sheath there.  We then placed a Rosen wire followed by a long 7 French sheath to the aorta.  On the right side we placed a Rosen wire placed a long 7 [place] sheath patient was given 5000 units of heparin.  We then performed aortogram to mark our stenosis.  We brought to 8 x 29 mm VBX to the field deployed them simultaneously.  Completion angiography from either side demonstrated patency however the limbs are crossing was difficult to see both at the same time.  We then performed pullback gradients bilaterally which demonstrated no arterial gradient.  Satisfied with this we placed a Rosen wire was exchanged for short 7 French sheath deployed next device bilaterally.  She tolerated procedure without any complication  Contrast: 21HY   Nigel Ericsson C. Donzetta Matters, MD [facility] [place] Office: [phone] Pager: [phone]

## 2021-02-12 NOTE — Interval H&P Note (Signed)
History and Physical Interval Note:  02/12/2021 9:22 AM  [name]  has presented today for surgery, with the diagnosis of anemia, hemocult positive stool.  The various methods of treatment have been discussed with the patient and family. After consideration of risks, benefits and other options for treatment, the patient has consented to  Angiography with possible intervention as a surgical intervention.  The patient's history has been reviewed, patient examined, no change in status, stable for surgery.  I have reviewed the patient's chart and labs.  Questions were answered to the patient's satisfaction.     [name]

## 2021-02-12 NOTE — Progress Notes (Addendum)
ANTICOAGULATION CONSULT NOTE - Follow Up Consult  Pharmacy Consult for apixaban Indication: atrial fibrillation  Allergies  Allergen Reactions   Dilaudid [Hydromorphone] Other (See Comments)    Excessive Somnolence- Required Narcan   Fentanyl Other (See Comments)    Required Narcan when given with Dilaudid. Tolerated single dose when given during procedure 02/03/2019.    Codeine Nausea And Vomiting    Hallucinations    Morphine And Related Nausea And Vomiting    Hallucinations    Whole Blood Itching and Nausea And Vomiting    Pt and family stated pt will receive blood products but had a reaction requiring benadryl on a previous administration.    Sulfonamide Derivatives Other (See Comments)    Dry mouth    Patient Measurements: Height: 5' 6.5" (168.9 cm) Weight: 70.9 kg (156 lb 4.9 oz) IBW/kg (Calculated) : 60.45 Heparin Dosing Weight:   Vital Signs: Temp: 98.2 F (36.8 C) (10/10 0822) BP: 125/57 (10/10 0822) Pulse Rate: 62 (10/10 0822)  Labs: Recent Labs    02/10/21 0210 02/10/21 1115 02/10/21 1824 02/11/21 0253 02/12/21 0232  HGB 8.6*   < > 8.8* 9.1* 8.7*  HCT 27.6*   < > 28.6* 29.8* 28.9*  PLT 129*   < > 137* 157 154  CREATININE 1.76*  --   --  1.48* 1.61*   < > = values in this interval not displayed.    Estimated Creatinine Clearance: 24.8 mL/min (A) (by C-G formula based on SCr of 1.61 mg/dL (H)).   Medications:  Medications Prior to Admission  Medication Sig Dispense Refill Last Dose   acetaminophen (TYLENOL) 500 MG tablet Take 500-1,000 mg by mouth every 8 (eight) hours as needed for mild pain or headache.    02/08/2021   apixaban (ELIQUIS) 2.5 MG TABS tablet Take 1 tablet (2.5 mg total) by mouth 2 (two) times daily. Dispense starter pack (Patient taking differently: Take 2.5 mg by mouth 2 (two) times daily.) 60 tablet 11 02/07/2021 at 0800   Cholecalciferol (VITAMIN D) 50 MCG (2000 UT) CAPS Take 4,000 Units by mouth daily.   02/07/2021   colchicine 0.6 MG  tablet Take 2 pills followed by 1 pill 1 hour later. Then if no improvement in 2 days take 1 pill daily for up to 3 days. (Patient taking differently: Take 0.6 mg by mouth daily.) 10 tablet 0 02/07/2021   Cranberry (AZO CRANBERRY GUMMIES) 500 MG CHEW Chew 2,000 mg by mouth daily.   02/07/2021   Cyanocobalamin (B-12 PO) Take 1 tablet by mouth daily.    02/07/2021   ezetimibe (ZETIA) 10 MG tablet Take 1 tablet (10 mg total) by mouth daily. 30 tablet 11 02/07/2021   ferrous sulfate 325 (65 FE) MG tablet Take 1 tablet (325 mg total) by mouth 2 (two) times daily with a meal. 60 tablet 0 02/07/2021   fluticasone (FLONASE) 50 MCG/ACT nasal spray Place 2 sprays into both nostrils daily as needed for allergies or rhinitis. 16 g 6 Past Week   levothyroxine (SYNTHROID) 25 MCG tablet TAKE 1 TABLET BY MOUTH ONCE DAILY BEFOREBREAKFAST (Patient taking differently: Take 25 mcg by mouth daily before breakfast.) 30 tablet 3 02/07/2021   losartan (COZAAR) 25 MG tablet Take 0.5 tablets (12.5 mg total) by mouth daily. 90 tablet 3 02/07/2021   magnesium oxide (MAG-OX) 400 MG tablet Take 1 tablet (400 mg total) by mouth daily. 30 tablet 2 02/07/2021   metolazone (ZAROXOLYN) 2.5 MG tablet Take 2.5 mg by mouth daily as needed (For  fluid).   Past Week   mupirocin ointment (BACTROBAN) 2 % Apply 1 application topically daily. Qd to wound on foot (Patient taking differently: Apply 1 application topically daily.) 22 g 1 Past Week   ondansetron (ZOFRAN) 4 MG tablet Take 1 tablet (4 mg total) by mouth every 8 (eight) hours as needed for nausea or vomiting. 20 tablet 1 02/08/2021   pantoprazole (PROTONIX) 40 MG tablet TAKE 1 TABLET (40 MG TOTAL) BY MOUTH DAILY AT 6 (SIX) AM. (Patient taking differently: Take 40 mg by mouth daily.) 30 tablet 0 02/07/2021   potassium chloride SA (KLOR-CON M20) 20 MEQ tablet Take 2 tablets (40 mEq total) by mouth 2 (two) times daily. Take 1 extra tablet on the days you take metolazone. 360 tablet 3 02/07/2021    rosuvastatin (CRESTOR) 40 MG tablet Take 1 tablet (40 mg total) by mouth daily. 90 tablet 3 02/07/2021   torsemide (DEMADEX) 100 MG tablet TAKE 1 TABLET BY MOUTH EVERY MORNING (EVENING DOSE 80 MG) (Patient taking differently: Take 100 mg by mouth daily.) 30 tablet 11 02/07/2021   vitamin C (ASCORBIC ACID) 250 MG tablet Take 500 mg by mouth daily.   02/07/2021   Scheduled:   apixaban  2.5 mg Oral BID   [MAR Hold] ezetimibe  10 mg Oral Daily   [MAR Hold] levothyroxine  25 mcg Oral Q0600   [MAR Hold] lidocaine  2 patch Transdermal Q24H   [MAR Hold] midodrine  5 mg Oral TID WC   [MAR Hold] pantoprazole (PROTONIX) IV  40 mg Intravenous Q12H   [MAR Hold] rosuvastatin  40 mg Oral Daily   [MAR Hold] sodium chloride flush  3 mL Intravenous Q12H   [MAR Hold] torsemide  40 mg Oral BID   [MAR Hold] traMADol  50 mg Oral Once   Infusions:   sodium chloride Stopped (02/12/21 0509)   [MAR Hold] ceFEPime (MAXIPIME) IV Stopped (02/11/21 1035)   [START ON 02/13/2021] cefTRIAXone (ROCEPHIN)  IV     vancomycin      Assessment: Pt with a hx of AF on apixaban prior to admission. Apixaban has been on hold for angiography with possible intervention today. Apixaban has been ordered to be resume post procedure.  Addendum:  Dr Candiss Norse stated that [facility] would like the apixaban to be restarted 10/11 at 10 AM.  Scr 1.63 Hgb 10.2, plt wnl Wt >60kg, age>80  Goal of Therapy:   Monitor platelets by anticoagulation protocol: Yes   Plan:  Resume apixaban 2.5mg  BID start 10/11 @1000  Rx will monitor peripherally  Onnie Boer, PharmD, BCIDP, AAHIVP, CPP Infectious Disease Pharmacist 02/12/2021 10:08 AM

## 2021-02-13 ENCOUNTER — Telehealth (HOSPITAL_COMMUNITY): Payer: Self-pay | Admitting: Adult Health

## 2021-02-13 DIAGNOSIS — Z95828 Presence of other vascular implants and grafts: Secondary | ICD-10-CM

## 2021-02-13 LAB — COMPREHENSIVE METABOLIC PANEL
ALT: 28 U/L (ref 0–44)
AST: 34 U/L (ref 15–41)
Albumin: 3.3 g/dL — ABNORMAL LOW (ref 3.5–5.0)
Alkaline Phosphatase: 55 U/L (ref 38–126)
Anion gap: 13 (ref 5–15)
BUN: 36 mg/dL — ABNORMAL HIGH (ref 8–23)
CO2: 27 mmol/L (ref 22–32)
Calcium: 9 mg/dL (ref 8.9–10.3)
Chloride: 96 mmol/L — ABNORMAL LOW (ref 98–111)
Creatinine, Ser: 1.5 mg/dL — ABNORMAL HIGH (ref 0.44–1.00)
GFR, Estimated: 34 mL/min — ABNORMAL LOW (ref >60)
Glucose, Bld: 92 mg/dL (ref 70–99)
Potassium: 3.9 mmol/L (ref 3.5–5.1)
Sodium: 136 mmol/L (ref 135–145)
Total Bilirubin: 1 mg/dL (ref 0.3–1.2)
Total Protein: 6.2 g/dL — ABNORMAL LOW (ref 6.5–8.1)

## 2021-02-13 LAB — CBC WITH DIFFERENTIAL/PLATELET
Abs Immature Granulocytes: 0.03 10*3/uL (ref 0.00–0.07)
Basophils Absolute: 0 10*3/uL (ref 0.0–0.1)
Basophils Relative: 1 %
Eosinophils Absolute: 0 10*3/uL (ref 0.0–0.5)
Eosinophils Relative: 1 %
HCT: 29.7 % — ABNORMAL LOW (ref 36.0–46.0)
Hemoglobin: 9.2 g/dL — ABNORMAL LOW (ref 12.0–15.0)
Immature Granulocytes: 1 %
Lymphocytes Relative: 13 %
Lymphs Abs: 0.6 10*3/uL — ABNORMAL LOW (ref 0.7–4.0)
MCH: 28.4 pg (ref 26.0–34.0)
MCHC: 31 g/dL (ref 30.0–36.0)
MCV: 91.7 fL (ref 80.0–100.0)
Monocytes Absolute: 0.6 10*3/uL (ref 0.1–1.0)
Monocytes Relative: 15 %
Neutro Abs: 3.1 10*3/uL (ref 1.7–7.7)
Neutrophils Relative %: 69 %
Platelets: 185 10*3/uL (ref 150–400)
RBC: 3.24 MIL/uL — ABNORMAL LOW (ref 3.87–5.11)
RDW: 15.7 % — ABNORMAL HIGH (ref 11.5–15.5)
WBC: 4.4 10*3/uL (ref 4.0–10.5)
nRBC: 0 % (ref 0.0–0.2)

## 2021-02-13 LAB — MAGNESIUM: Magnesium: 1.9 mg/dL (ref 1.7–2.4)

## 2021-02-13 LAB — CULTURE, BLOOD (ROUTINE X 2)
Culture: NO GROWTH
Culture: NO GROWTH
Special Requests: ADEQUATE

## 2021-02-13 LAB — C-REACTIVE PROTEIN: CRP: 10.1 mg/dL — ABNORMAL HIGH (ref <1.0)

## 2021-02-13 LAB — BRAIN NATRIURETIC PEPTIDE: B Natriuretic Peptide: 647.3 pg/mL — ABNORMAL HIGH (ref 0.0–100.0)

## 2021-02-13 MED ORDER — LACTATED RINGERS IV SOLN: INTRAVENOUS | Status: DC

## 2021-02-13 MED ORDER — SODIUM CHLORIDE 0.9 % IV SOLN
12.5000 mg | Freq: Once | INTRAVENOUS | Status: AC
  Administered 2021-02-13: 12.5 mg via INTRAVENOUS
  Filled 2021-02-13: qty 0.5

## 2021-02-13 NOTE — Progress Notes (Signed)
Pt daughter at bedside today was concerned with tramadol use, stated her mother get nauseous after multiple doses in a row. We decided to only use tylenol unless the pain becomes unbearable requiring tramadol.   Chrisandra Carota, RN 02/13/2021 1:51 PM

## 2021-02-13 NOTE — Progress Notes (Signed)
PROGRESS NOTE        PATIENT DETAILS Name: [name] Age: 87 y.o. Sex: female Date of Birth: Nov 15, 1933 Admit Date: 02/08/2021 Admitting Physician Norval Morton, MD ELF:YBOF, Jobe Marker, MD  Brief Narrative: Patient is a 87 y.o. female with history of A. 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  See below for further details.  Subjective: Patient in bed, appears comfortable, denies any headache, no fever, no chest pain or pressure, no shortness of breath , no abdominal pain. No new focal weakness.   Objective: Vitals: Blood pressure (!) 138/55, pulse 70, temperature 97.7 F (36.5 C), temperature source Oral, resp. rate 20, height 5' 6.5" (1.689 m), weight 75.6 kg, SpO2 95 %.   Exam:  Awake Alert, No new F.N deficits, Normal affect [place].AT,PERRAL Supple Neck, No JVD,   Symmetrical Chest wall movement, Good air movement bilaterally, CTAB RRR,No Gallops, Rubs or new Murmurs, No Parasternal Heave +ve B.Sounds, Abd Soft, No tenderness,   No Cyanosis, Clubbing or edema,     Skin: L Leg as below     Pertinent Labs/Radiology: WBC: 7.2 Hb: 8.7 Na: 131 K: 3.7 Creatinine: 1.89  10/6>>Blood culture: Pending 10/6>> x-ray left ankle: Diffuse edema-no osteomyelitis. 10/ 8EGD  Impression:               - Normal esophagus.                           - A few non-bleeding angiodysplastic lesions in the                            stomach. Treated with a monopolar probe.                           - Four bleeding angiodysplastic lesions in the                            duodenum. Treated with a monopolar probe.                           - A single non-bleeding angiodysplastic lesion in                             the jejunum. Treated with a monopolar probe.                           - No specimens collected. Recommendation:           - Return patient to hospital ward for ongoing care.                           - Resume regular diet.                           - Restart Eliquis in 3 days.                           -  Follow HGB and transfuse as necessary.   02/12/2021 Pre-operative Diagnosis: Chronic limb threatening ischemia with ulceration left lower extremity Post-operative diagnosis:  Same Surgeon:  Erlene Quan C. Donzetta Matters, MD Procedure Performed: 1.  Ultrasound-guided cannulation bilateral common femoral arteries 2.  CO2 aortogram 3.  Selection of left common femoral artery and left lower extremity angiogram 4.  Bilateral common iliac artery stenting with 8 x 29 mm VBX 5.  Mynx device closure bilateral common femoral arteries 6.  Moderate sedation with Versed for 59 minutes   Assessment/Plan:  Sepsis due to left leg cellulitis superimposed on underlying squamous cell carcinoma: Sepsis physiology slowly improving-continue empiric antibiotics-await cultures.  Vascular surgery did angiogram on 10/10 with Bilateral common iliac artery stenting , currently on aspirin, Eliquis and Crestor for secondary prevention.  Melena-likely slow GI bleeding-with acute on chronic blood loss anemia: Having melanotic stools-2 g drop in hemoglobin-seen by GI, post EGD 02/10/21 - few non-bleeding angiodysplastic lesions in the stomach. Treated with a monopolar probe. Four bleeding angiodysplastic lesions in the duodenum. Treated with a monopolar probe. A single non-bleeding angiodysplastic lesion in the jejunum. Treated with a monopolar probe, continue PPI, stable H&H.  PAD: Vascular surgery following-angiogram planned for 10/10-after Eliquis washout.  Acute hypoxic respiratory failure due to COPD exacerbation: Improved-minimal wheezing present today-continue bronchodilators.  Attempt to titrate off  oxygen.  CKD stage IIIb: Creatinine slightly elevated-also developing some leg edema, resumed less than half home dose diuretic on 02/10/2021.  Chronic atrial fibrillation: Paced rhythm-Eliquis on hold  HFrEF: Euvolemic on exam-her EF has recovered-she has ICD in place.  HTN: BP stable, off Meds.  HLD: Continue statin  Hypothyroidism: Continue Synthroid  GERD: Continue PPI  Squamous cell carcinoma involving left leg ulcer: Stable for outpatient follow-up with dermatology  Hyperuricemia - follow with PCP, doubt any acute issue.    Consults: GI,  DVT Prophylaxis: SCD's Code Status:Full code  Family Communication: None at bedside  Time spent: 35 minutes-Greater than 50% of this time was spent in counseling, explanation of diagnosis, planning of further management, and coordination of care.  Diet: Diet Order             Diet regular Room service appropriate? Yes; Fluid consistency: Thin  Diet effective now                    Disposition Plan: Status is: Inpatient  Remains inpatient appropriate because:Inpatient level of care appropriate due to severity of illness  Dispo: The patient is from: Home              Anticipated d/c is to: Home              Patient currently is not medically stable to d/c.   Difficult to place patient No     Barriers to Discharge: Resolving sepsis physiology-possible GI bleeding-not yet stable for discharge.  Antimicrobial agents: Anti-infectives (From admission, onward)    Start     Dose/Rate Route Frequency Ordered Stop   02/13/21 1000  cefTRIAXone (ROCEPHIN) 2 g in sodium chloride 0.9 % 100 mL IVPB        2 g 200 mL/hr over 30 Minutes Intravenous Every 24 hours 02/12/21 0952     02/12/21 1800  vancomycin (VANCOREADY) IVPB 500 mg/100 mL        500 mg 100 mL/hr over 60 Minutes Intravenous  Once 02/12/21 0952 02/12/21 1922   02/12/21 1530  ceFEPIme (MAXIPIME) 2 g in sodium chloride 0.9 % 100 mL IVPB  2 g 200 mL/hr over 30  Minutes Intravenous  Once 02/12/21 1437 02/12/21 1521   02/10/21 1000  vancomycin (VANCOREADY) IVPB 1250 mg/250 mL  Status:  Discontinued        1,250 mg 166.7 mL/hr over 90 Minutes Intravenous Every 48 hours 02/08/21 1346 02/09/21 1425   02/10/21 1000  vancomycin (VANCOCIN) IVPB 1000 mg/200 mL premix  Status:  Discontinued        1,000 mg 200 mL/hr over 60 Minutes Intravenous Every 48 hours 02/09/21 1425 02/12/21 0952   02/08/21 1130  ceFEPIme (MAXIPIME) 2 g in sodium chloride 0.9 % 100 mL IVPB        2 g 200 mL/hr over 30 Minutes Intravenous Every 24 hours 02/08/21 1112 02/12/21 1030   02/08/21 1115  vancomycin (VANCOREADY) IVPB 1250 mg/250 mL        1,250 mg 166.7 mL/hr over 90 Minutes Intravenous  Once 02/08/21 1112 02/08/21 1509   02/08/21 0000  cephALEXin (KEFLEX) 500 MG capsule        500 mg Oral 2 times daily 02/08/21 0719     02/08/21 0000  doxycycline (VIBRAMYCIN) 100 MG capsule        100 mg Oral 2 times daily 02/08/21 0719          MEDICATIONS: Scheduled Meds:  apixaban  2.5 mg Oral BID   aspirin EC  81 mg Oral Daily   ezetimibe  10 mg Oral Daily   levothyroxine  25 mcg Oral Q0600   lidocaine  2 patch Transdermal Q24H   pantoprazole (PROTONIX) IV  40 mg Intravenous Q12H   rosuvastatin  40 mg Oral Daily   torsemide  40 mg Oral BID   Continuous Infusions:  sodium chloride     cefTRIAXone (ROCEPHIN)  IV 2 g (02/13/21 0918)   PRN Meds:.sodium chloride, acetaminophen **OR** [DISCONTINUED] acetaminophen, albuterol, fluticasone, hydrALAZINE, labetalol, [DISCONTINUED] ondansetron **OR** ondansetron (ZOFRAN) IV, sodium chloride flush, traMADol   I have personally reviewed following labs and imaging studies  LABORATORY DATA: CBC: Recent Labs  Lab 02/08/21 0156 02/09/21 0207 02/10/21 1824 02/11/21 0253 02/12/21 0232 02/12/21 1455 02/13/21 0333  WBC 9.1   < > 3.6* 4.3 3.6* 3.5* 4.4  NEUTROABS 8.0*  --   --  3.4 2.5  --  3.1  HGB 10.2*   < > 8.8* 9.1* 8.7* 9.2*  9.2*  HCT 32.3*   < > 28.6* 29.8* 28.9* 30.2* 29.7*  MCV 91.8   < > 91.7 92.5 93.2 93.5 91.7  PLT 172   < > 137* 157 154 158 185   < > = values in this interval not displayed.    Basic Metabolic Panel: Recent Labs  Lab 02/09/21 0207 02/10/21 0210 02/11/21 0253 02/12/21 0232 02/12/21 1455 02/13/21 0333  NA 131* 131* 134* 139  --  136  K 3.7 4.3 3.5 4.1  --  3.9  CL 97* 98 98 99  --  96*  CO2 24 24 27  32  --  27  GLUCOSE 103* 98 117* 100*  --  92  BUN 48* 51* 47* 41*  --  36*  CREATININE 1.89* 1.76* 1.48* 1.61* 1.57* 1.50*  CALCIUM 8.1* 8.5* 8.6* 8.8*  --  9.0  MG  --   --  2.0 1.9  --  1.9    GFR: Estimated Creatinine Clearance: 29.3 mL/min (A) (by C-G formula based on SCr of 1.5 mg/dL (H)).  Liver Function Tests: Recent Labs  Lab 02/08/21 0156 02/11/21 0253 02/12/21  2426 02/13/21 0333  AST 18 50* 36 34  ALT 15 34 29 28  ALKPHOS 46 58 50 55  BILITOT 0.9 0.9 1.1 1.0  PROT 6.6 6.1* 5.9* 6.2*  ALBUMIN 4.0 3.2* 3.0* 3.3*   No results for input(s): LIPASE, AMYLASE in the last 168 hours. No results for input(s): AMMONIA in the last 168 hours.  Coagulation Profile: No results for input(s): INR, PROTIME in the last 168 hours.  Cardiac Enzymes: No results for input(s): CKTOTAL, CKMB, CKMBINDEX, TROPONINI in the last 168 hours.  BNP (last 3 results) No results for input(s): PROBNP in the last 8760 hours.  Lipid Profile: No results for input(s): CHOL, HDL, LDLCALC, TRIG, CHOLHDL, LDLDIRECT in the last 72 hours.   Thyroid Function Tests: No results for input(s): TSH, T4TOTAL, FREET4, T3FREE, THYROIDAB in the last 72 hours.  Anemia Panel: No results for input(s): VITAMINB12, FOLATE, FERRITIN, TIBC, IRON, RETICCTPCT in the last 72 hours.  Urine analysis:    Component Value Date/Time   COLORURINE YELLOW 02/08/2021 0942   APPEARANCEUR CLEAR 02/08/2021 0942   APPEARANCEUR Cloudy (A) 01/27/2018 1330   LABSPEC 1.018 02/08/2021 0942   PHURINE 5.0 02/08/2021 0942    GLUCOSEU NEGATIVE 02/08/2021 0942   HGBUR NEGATIVE 02/08/2021 0942   HGBUR large 11/16/2009 1202   BILIRUBINUR NEGATIVE 02/08/2021 0942   BILIRUBINUR neg 05/02/2020 0911   BILIRUBINUR Negative 01/27/2018 1330   KETONESUR NEGATIVE 02/08/2021 0942   PROTEINUR NEGATIVE 02/08/2021 0942   UROBILINOGEN 0.2 05/02/2020 0911   UROBILINOGEN 1.0 08/16/2014 1754   NITRITE NEGATIVE 02/08/2021 0942   LEUKOCYTESUR LARGE (A) 02/08/2021 0942    Sepsis Labs: Lactic Acid, Venous    Component Value Date/Time   LATICACIDVEN 1.4 02/08/2021 0352    RADIOLOGY STUDIES/RESULTS: PERIPHERAL VASCULAR CATHETERIZATION  Result Date: 02/12/2021 Images from the original result were not included. Patient name: [name] MRN: [id number] DOB: 29-Sep-1933 Sex: female 02/12/2021 Pre-operative Diagnosis: Chronic limb threatening ischemia with ulceration left lower extremity Post-operative diagnosis:  Same Surgeon:  Erlene Quan C. Donzetta Matters, MD Procedure Performed: 1.  Ultrasound-guided cannulation bilateral common femoral arteries 2.  CO2 aortogram 3.  Selection of left common femoral artery and left lower extremity angiogram 4.  Bilateral common iliac artery stenting with 8 x 29 mm VBX 5.  Mynx device closure bilateral common femoral arteries 6.  Moderate sedation with Versed for 59 minutes Indications: 87 year old female with a history of squamous cell carcinoma of her left foot.  She underwent biopsy which is failed to heal she has chronic limb threatening ischemia she is indicated for angiography with possible intervention.  She does have elevated creatinine we are planning to use CO2. Findings: Bilateral common femoral arteries were small but free of disease.  The aorta was approximately patent both renal arteries appeared to fill with CO2.  The IMA was large.  Distally the aorta tapered down with a napkin ring type high-grade stenosis.  This was at least 80 to 90% stenosis.  Bilateral common iliac arteries then were patent.  We  selected a left common femoral artery and there was what appeared to be inline flow with dominant via the posterior tibial artery with CO2.  After stenting the high-grade stenosis of the distal aorta is resolved.  The bifurcation was raised.  There was no gradient from the aorta to the bilateral common femoral sheaths at completion.  Procedure:  The patient was identified in the holding area and taken to room 8.  The patient was then placed supine on  the table and prepped and draped in the usual sterile fashion.  A time out was called.  Ultrasound was used to evaluate the first right common femoral artery.  There is anesthetized 1% lidocaine cannulated micropuncture needle followed by wire and sheath.  We placed a 5 French sheath and Bentson wire and pigtail catheter into the aorta.  CO2 aortogram was performed.  We then crossed the bifurcation placed a straight catheter perform left lower extremity angiography with CO2.  With the above findings we elected to intervene on the tight distal aortic stenosis.  We replaced the pigtail catheter.  We performed contrasted angiography.  We then evaluated the left common femoral artery.  This was noted to be patent compressible.  The areas Him stenotic and cannulated micropuncture needle followed by wire and sheath there.  We then placed a Rosen wire followed by a long 7 French sheath to the aorta.  On the right side we placed a Rosen wire placed a long 7 [place] sheath patient was given 5000 units of heparin.  We then performed aortogram to mark our stenosis.  We brought to 8 x 29 mm VBX to the field deployed them simultaneously.  Completion angiography from either side demonstrated patency however the limbs are crossing was difficult to see both at the same time.  We then performed pullback gradients bilaterally which demonstrated no arterial gradient.  Satisfied with this we placed a Rosen wire was exchanged for short 7 French sheath deployed next device bilaterally.  She  tolerated procedure without any complication Contrast: 19JK Brandon C. Donzetta Matters, MD [facility] Office: [phone] Pager: [phone]     LOS: 5 days   Signature  Lala Lund M.D on 02/13/2021 at 11:27 AM   -  To page go to [email or web address]

## 2021-02-13 NOTE — Care Management Important Message (Signed)
Important Message  Patient Details  Name: JARIKA ROBBEN MRN: [id number] Date of Birth: [birth year]   Medicare Important Message Given:  Yes     Shelda Altes 02/13/2021, 10:50 AM

## 2021-02-13 NOTE — Progress Notes (Signed)
Physical Therapy Treatment Patient Details Name: [name] MRN: [id number] DOB: 1933-09-02 Today's Date: 02/13/2021   History of Present Illness pt is an 87 y/o female admitted 10/6 with complaints of worsening L foot pain.  On 10/10 pt underwent angiogram and bilateral iliac stenting. PMHx:  afib, dCHF, severe tricuspid regurgitation, NICM s/p ICD, COPD, PAD, CKD3, RCC s/p nephrectomy, ischemic colitis.    PT Comments    Pt limited with amb today primarily due to nausea. Only able to amb short distance to sink before too nauseous to continue. BP's stable 140's/50's. SpO2 88-90% on RA and 97% on 1L.   Recommendations for follow up therapy are one component of a multi-disciplinary discharge planning process, led by the attending physician.  Recommendations may be updated based on patient status, additional functional criteria and insurance authorization.  Follow Up Recommendations  Home health PT     Equipment Recommendations  None recommended by PT    Recommendations for Other Services       Precautions / Restrictions Precautions Precautions: Fall;Other (comment) Precaution Comments: watch o2, lots of nausea     Mobility  Bed Mobility               General bed mobility comments: Pt up in chair    Transfers Overall transfer level: Needs assistance Equipment used: Rolling walker (2 wheeled) Transfers: Sit to/from Stand Sit to Stand: Min guard         General transfer comment: for safety  Ambulation/Gait Ambulation/Gait assistance: Min guard Gait Distance (Feet): 12 Feet Assistive device: Rolling walker (2 wheeled);1 person hand held assist Gait Pattern/deviations: Step-through pattern;Decreased stance time - left Gait velocity: decr Gait velocity interpretation: <1.31 ft/sec, indicative of household ambulator General Gait Details: Assist for safety and lines. Pt very nauseous and only able to amb to sink due to this   Stairs              Wheelchair Mobility    Modified Rankin (Stroke Patients Only)       Balance Overall balance assessment: Needs assistance Sitting-balance support: No upper extremity supported;Feet supported Sitting balance-Leahy Scale: Good     Standing balance support: No upper extremity supported Standing balance-Leahy Scale: Fair                              Cognition Arousal/Alertness: Awake/alert Behavior During Therapy: WFL for tasks assessed/performed Overall Cognitive Status: Within Functional Limits for tasks assessed                                        Exercises      General Comments General comments (skin integrity, edema, etc.): SpO2 97% on 1L. On RA pt 88-90%.      Pertinent Vitals/Pain Pain Assessment: Faces Faces Pain Scale: Hurts a little bit Pain Location: L foot Pain Descriptors / Indicators: Grimacing;Guarding Pain Intervention(s): Limited activity within patient's tolerance    Home Living                      Prior Function            PT Goals (current goals can now be found in the care plan section) Progress towards PT goals: Not progressing toward goals - comment (nausea)    Frequency    Min 3X/week  PT Plan      Co-evaluation              AM-PAC PT "6 Clicks" Mobility   Outcome Measure  Help needed turning from your back to your side while in a flat bed without using bedrails?: A Little Help needed moving from lying on your back to sitting on the side of a flat bed without using bedrails?: A Little Help needed moving to and from a bed to a chair (including a wheelchair)?: A Little Help needed standing up from a chair using your arms (e.g., wheelchair or bedside chair)?: A Little Help needed to walk in hospital room?: A Little Help needed climbing 3-5 steps with a railing? : A Lot 6 Click Score: 17    End of Session   Activity Tolerance: Other (comment) (limited by nausea) Patient  left: in chair (at sink with nurse tech coming to set up for bath) Nurse Communication: Mobility status;Other (comment) (nausea) PT Visit Diagnosis: Unsteadiness on feet (R26.81);Other abnormalities of gait and mobility (R26.89);Difficulty in walking, not elsewhere classified (R26.2)     Time: 3709-6438 PT Time Calculation (min) (ACUTE ONLY): 26 min  Charges:  $Gait Training: 23-37 mins                     [place] Pager [phone] [place] 02/13/2021, 6:12 PM

## 2021-02-13 NOTE — Progress Notes (Addendum)
Progress Note    02/13/2021 7:14 AM 1 Day Post-Op  Subjective:  still with pain in left foot otherwise feeling good   Vitals:   02/12/21 2358 02/13/21 0334  BP: (!) 148/72 (!) 152/95  Pulse: 75 73  Resp: 20 20  Temp: 97.7 F (36.5 C) 97.7 F (36.5 C)  SpO2: 96% 95%   Physical Exam: Cardiac:  regular Lungs:  non labored Extremities:  B groin access sites c/d/I, small hematoma in left groin, right groin soft, no bleeding. Left AT/ PT Doppler signals, right DP/ PT doppler signals Abdomen:  obese, soft, non tender Neurologic: alert and oriented  CBC    Component Value Date/Time   WBC 4.4 02/13/2021 0333   RBC 3.24 (L) 02/13/2021 0333   HGB 9.2 (L) 02/13/2021 0333   HGB 8.1 (L) 03/22/2019 1415   HCT 29.7 (L) 02/13/2021 0333   HCT 25.9 (L) 03/22/2019 1415   PLT 185 02/13/2021 0333   PLT 226 03/22/2019 1415   MCV 91.7 02/13/2021 0333   MCV 88 03/22/2019 1415   MCH 28.4 02/13/2021 0333   MCHC 31.0 02/13/2021 0333   RDW 15.7 (H) 02/13/2021 0333   RDW 19.9 (H) 03/22/2019 1415   LYMPHSABS 0.6 (L) 02/13/2021 0333   LYMPHSABS 0.6 (L) 03/22/2019 1415   MONOABS 0.6 02/13/2021 0333   EOSABS 0.0 02/13/2021 0333   EOSABS 0.2 03/22/2019 1415   BASOSABS 0.0 02/13/2021 0333   BASOSABS 0.0 03/22/2019 1415    BMET    Component Value Date/Time   NA 136 02/13/2021 0333   NA 141 07/14/2019 0000   K 3.9 02/13/2021 0333   CL 96 (L) 02/13/2021 0333   CO2 27 02/13/2021 0333   GLUCOSE 92 02/13/2021 0333   BUN 36 (H) 02/13/2021 0333   BUN 29 (A) 07/14/2019 0000   CREATININE 1.50 (H) 02/13/2021 0333   CREATININE 1.34 (H) 03/19/2011 0908   CALCIUM 9.0 02/13/2021 0333   GFRNONAA 34 (L) 02/13/2021 0333   GFRAA 39 (L) 12/23/2019 1458    INR    Component Value Date/Time   INR 1.4 (H) 03/31/2020 0057   INR 2.2 01/19/2010 1536     Intake/Output Summary (Last 24 hours) at 02/13/2021 0714 Last data filed at 02/12/2021 1500 Gross per 24 hour  Intake 265.04 ml  Output --   Net 265.04 ml     Assessment/Plan:  87 y.o. female is s/p  1.  Ultrasound-guided cannulation bilateral common femoral arteries 2.  CO2 aortogram 3.  Selection of left common femoral artery and left lower extremity angiogram 4.  Bilateral common iliac artery stenting with 8 x 29 mm VBX 5.  Mynx device closure bilateral common femoral arteries 6.  Moderate sedation with Versed for 59 minutes 1 Day Post-Op   Doing well post intervention B groin access sites c/d/I. Small hematoma in left groin, stable BLE well perfused and warm. Doppler signals in both feet Scr stable  Continue ASA, Eliquis, Crestor Okay for d/c from vascular standpoint Will have follow up in 1 month with iliac duplex  DVT prophylaxis:    Karoline Caldwell, PA-C [facility] [phone] 02/13/2021 7:14 AM  VASCULAR STAFF ADDENDUM: I have independently interviewed and examined the patient. I agree with the above.  Looks good s/p BCIA stenting for chronic limb threatening ischemia. Left foot feels better subjectively. Right groin soft. Left groin small hematoma.  Brisk doppler flow in L DP OK to resume Eliquis. Needs ASA / Statin for PAD. Follow up  with [facility] in 1 month with ABI / iliac duplex.  [name]. Stanford Breed, MD [facility] Phone Number: [phone] 02/13/2021 8:18 AM

## 2021-02-13 NOTE — Progress Notes (Signed)
Pt dry heaving most of the day, zofran administered 2x, N/V unrelieved, not eating, MD notified.  Chrisandra Carota, RN 02/13/2021 6:53 PM

## 2021-02-13 NOTE — Telephone Encounter (Signed)
Attempted to call and remind to complete [facility] Reading.   Please call.   Ashlea Dusing NP-C  3:44 PM

## 2021-02-14 LAB — C-REACTIVE PROTEIN: CRP: 7.7 mg/dL — ABNORMAL HIGH (ref <1.0)

## 2021-02-14 MED ORDER — PANTOPRAZOLE SODIUM 40 MG PO TBEC
40.0000 mg | DELAYED_RELEASE_TABLET | Freq: Two times a day (BID) | ORAL | Status: DC
  Administered 2021-02-14 – 2021-02-15 (×3): 40 mg via ORAL
  Filled 2021-02-14 (×3): qty 1

## 2021-02-14 MED ORDER — METHYLPREDNISOLONE SODIUM SUCC 125 MG IJ SOLR
60.0000 mg | Freq: Once | INTRAMUSCULAR | Status: AC
  Administered 2021-02-14: 60 mg via INTRAVENOUS
  Filled 2021-02-14: qty 2

## 2021-02-14 NOTE — Progress Notes (Addendum)
[facility] [place]  Subjective  - Doing well   Objective (!) 137/44 60 98.3 F (36.8 C) (Oral) 16 90%  Intake/Output Summary (Last 24 hours) at 02/14/2021 0736 Last data filed at 02/14/2021 3643 Gross per 24 hour  Intake 1515.15 ml  Output 2750 ml  Net -1234.85 ml    B Feet with good doppler signals Left LE with edema and mild ruber No change in wound appearence, dry B groins soft, without frank hematoma Lungs no labored breathing Heart RRR  Assessment/Planning: POD # 2 Bilateral common iliac artery stenting Continue ASA, Eliquis, Crestor Stable from a vascular point of view F/U 1 months with [facility]    Roxy Horseman 02/14/2021 7:36 AM --  Laboratory Lab Results: Recent Labs    02/12/21 1455 02/13/21 0333  WBC 3.5* 4.4  HGB 9.2* 9.2*  HCT 30.2* 29.7*  PLT 158 185   BMET Recent Labs    02/12/21 0232 02/12/21 1455 02/13/21 0333  NA 139  --  136  K 4.1  --  3.9  CL 99  --  96*  CO2 32  --  27  GLUCOSE 100*  --  92  BUN 41*  --  36*  CREATININE 1.61* 1.57* 1.50*  CALCIUM 8.8*  --  9.0    COAG Lab Results  Component Value Date   INR 1.4 (H) 03/31/2020   INR 1.4 (H) 03/30/2020   INR 2.1 (H) 03/29/2020   No results found for: PTT

## 2021-02-14 NOTE — Progress Notes (Signed)
Occupational Therapy Treatment Patient Details Name: [name] MRN: [id number] DOB: 02/28/1934 Today's Date: 02/14/2021   History of present illness pt is an 87 y/o female admitted 10/6 with complaints of worsening L foot pain.  On 10/10 pt underwent angiogram and bilateral iliac stenting. PMHx:  afib, dCHF, severe tricuspid regurgitation, NICM s/p ICD, COPD, PAD, CKD3, RCC s/p nephrectomy, ischemic colitis.   OT comments  [name] is  progressing well, with better pain management this session and no nausea. Pt requested to complete session without RW; overall she was min A for all transfers, toileting, grooming and functional ambulation. Pt was more unsteady without RW and had an increased fall risk - educated pt on RW use for now to increased safety, she verbalized understanding. Pt SpO2 was >95% throughout on 1L. Pt continues to benefit from OT acutely. D/c recommendation remains appropriate.    Recommendations for follow up therapy are one component of a multi-disciplinary discharge planning process, led by the attending physician.  Recommendations may be updated based on patient status, additional functional criteria and insurance authorization.    Follow Up Recommendations  No OT follow up;Supervision - Intermittent    Equipment Recommendations  None recommended by OT       Precautions / Restrictions Precautions Precautions: Fall;Other (comment) Precaution Comments: watch o2, lots of nausea Restrictions Weight Bearing Restrictions: No       Mobility Bed Mobility Overal bed mobility: Needs Assistance             General bed mobility comments: pt sitting EOB upon arrival, left sitting in chair    Transfers Overall transfer level: Needs assistance Equipment used: None;1 person hand held assist Transfers: Sit to/from Stand Sit to Stand: Min assist         General transfer comment: min A for transfers and mobility wtihout a RW this session    Balance  Overall balance assessment: Needs assistance Sitting-balance support: No upper extremity supported;Feet supported Sitting balance-Leahy Scale: Good     Standing balance support: Single extremity supported;During functional activity Standing balance-Leahy Scale: Fair                             ADL either performed or assessed with clinical judgement   ADL Overall ADL's : Needs assistance/impaired     Grooming: Min guard;Standing;Wash/dry hands Grooming Details (indicate cue type and reason): standing at the sink - close min guard for [place] Transfer: Ambulation;Minimal assistance Armed forces technical officer Details (indicate cue type and reason): min A for steady and safety wtihout RW Toileting- Clothing Manipulation and Hygiene: Supervision/safety;Sitting/lateral lean       Functional mobility during ADLs: Minimal assistance General ADL Comments: minA for standing/functional mobility tasks this session without AD - per pt request. Per report she does not use a RW at baseline. Educated pt to use a RW at this time to increase safety and stability due to balance and weakness noted throughout this session     Vision   Vision Assessment?: No apparent visual deficits   Perception     Praxis      Cognition Arousal/Alertness: Awake/alert Behavior During Therapy: WFL for tasks assessed/performed Overall Cognitive Status: Within Functional Limits for tasks assessed  Exercises     Shoulder Instructions       General Comments SpO2 on 1L >90%. Educated pt on purchasing a O2 finger monitor for home use    Pertinent Vitals/ Pain       Pain Assessment: Faces Faces Pain Scale: Hurts little more Pain Location: L foot Pain Descriptors / Indicators: Grimacing;Guarding Pain Intervention(s): Limited activity within patient's tolerance;Monitored during session   Frequency  Min 2X/week         Progress Toward Goals  OT Goals(current goals can now be found in the care plan section)  Progress towards OT goals: Progressing toward goals  Acute Rehab OT Goals Patient Stated Goal: I want to go home OT Goal Formulation: With patient Potential to Achieve Goals: Fair ADL Goals Pt Will Perform Grooming: with modified independence;standing Pt Will Perform Lower Body Bathing: with modified independence;sit to/from stand Pt Will Perform Lower Body Dressing: with modified independence;sit to/from stand Pt Will Transfer to Toilet: with modified independence;ambulating;regular height toilet Pt Will Perform Tub/Shower Transfer: with supervision;ambulating  Plan Discharge plan remains appropriate    Co-evaluation                 AM-PAC OT "6 Clicks" Daily Activity     Outcome Measure   Help from another person eating meals?: None Help from another person taking care of personal grooming?: A Little Help from another person toileting, which includes using toliet, bedpan, or urinal?: A Little Help from another person bathing (including washing, rinsing, drying)?: A Little Help from another person to put on and taking off regular upper body clothing?: None Help from another person to put on and taking off regular lower body clothing?: A Little 6 Click Score: 20    End of Session Equipment Utilized During Treatment: Oxygen  OT Visit Diagnosis: Unsteadiness on feet (R26.81);Other abnormalities of gait and mobility (R26.89);Repeated falls (R29.6);Pain   Activity Tolerance Patient tolerated treatment well   Patient Left in chair;with call bell/phone within reach   Nurse Communication Mobility status;Precautions        Time: 6886-4847 OT Time Calculation (min): 17 min  Charges: OT General Charges $OT Visit: 1 Visit OT Treatments $Self Care/Home Management : 8-22 mins  Cherly Erno A [place] Fite 02/14/2021, 12:15 PM

## 2021-02-14 NOTE — Progress Notes (Signed)
Physical Therapy Treatment Patient Details Name: [name] MRN: [id number] DOB: [birth year] Today's Date: 02/14/2021   History of Present Illness pt is an 87 y/o female admitted 10/6 with complaints of worsening L foot pain.  On 10/10 pt underwent angiogram and bilateral iliac stenting. PMHx:  afib, dCHF, severe tricuspid regurgitation, NICM s/p ICD, COPD, PAD, CKD3, RCC s/p nephrectomy, ischemic colitis.    PT Comments    Pt progressing towards goals. Improved tolerance for gait this session. Required min guard A for mobility tasks using RW. Was able to perform seated HEP as well. Educated about using RW at home for increased safety. Current recommendations appropriate. Will continue to follow acutely.    Recommendations for follow up therapy are one component of a multi-disciplinary discharge planning process, led by the attending physician.  Recommendations may be updated based on patient status, additional functional criteria and insurance authorization.  Follow Up Recommendations  Home health PT     Equipment Recommendations  None recommended by PT    Recommendations for Other Services       Precautions / Restrictions Precautions Precautions: Fall;Other (comment) Precaution Comments: watch o2, lots of nausea Restrictions Weight Bearing Restrictions: No     Mobility  Bed Mobility               General bed mobility comments: pt sitting EOB upon arrival, left sitting in chair    Transfers Overall transfer level: Needs assistance Equipment used: Rolling walker (2 wheeled) Transfers: Sit to/from Stand Sit to Stand: Min guard         General transfer comment: Min guard for safety to stand with RW  Ambulation/Gait Ambulation/Gait assistance: Min guard Gait Distance (Feet): 50 Feet Assistive device: Rolling walker (2 wheeled);1 person hand held assist Gait Pattern/deviations: Step-through pattern;Decreased stance time - left Gait velocity: decr    General Gait Details: Required min guard for safety. Distance limited secondary to fatigue.   Stairs             Wheelchair Mobility    Modified Rankin (Stroke Patients Only)       Balance Overall balance assessment: Needs assistance Sitting-balance support: No upper extremity supported;Feet supported Sitting balance-Leahy Scale: Good     Standing balance support: During functional activity;No upper extremity supported;Bilateral upper extremity supported Standing balance-Leahy Scale: Fair Standing balance comment: Able to maintain static standing without UE support at sink.                            Cognition Arousal/Alertness: Awake/alert Behavior During Therapy: WFL for tasks assessed/performed Overall Cognitive Status: Within Functional Limits for tasks assessed                                        Exercises General Exercises - Lower Extremity Long Arc Quad: AROM;Both;10 reps;Seated Hip Flexion/Marching: AROM;Both;10 reps;Seated    General Comments        Pertinent Vitals/Pain Pain Assessment: Faces Faces Pain Scale: Hurts little more Pain Location: L foot Pain Descriptors / Indicators: Grimacing;Guarding;Burning Pain Intervention(s): Limited activity within patient's tolerance;Monitored during session;Repositioned    Home Living                      Prior Function            PT Goals (current goals can now be found  in the care plan section) Acute Rehab PT Goals Patient Stated Goal: I want to go home PT Goal Formulation: With patient Time For Goal Achievement: 02/25/21 Potential to Achieve Goals: Good Progress towards PT goals: Progressing toward goals    Frequency    Min 3X/week      PT Plan Current plan remains appropriate    Co-evaluation              AM-PAC PT "6 Clicks" Mobility   Outcome Measure  Help needed turning from your back to your side while in a flat bed without using  bedrails?: A Little Help needed moving from lying on your back to sitting on the side of a flat bed without using bedrails?: A Little Help needed moving to and from a bed to a chair (including a wheelchair)?: A Little Help needed standing up from a chair using your arms (e.g., wheelchair or bedside chair)?: A Little Help needed to walk in hospital room?: A Little Help needed climbing 3-5 steps with a railing? : A Lot 6 Click Score: 17    End of Session Equipment Utilized During Treatment: Gait belt;Oxygen Activity Tolerance: Patient limited by fatigue Patient left: in chair;with call bell/phone within reach (at sink for wash up; NT notified) Nurse Communication: Mobility status PT Visit Diagnosis: Unsteadiness on feet (R26.81);Other abnormalities of gait and mobility (R26.89);Difficulty in walking, not elsewhere classified (R26.2)     Time: 6503-5465 PT Time Calculation (min) (ACUTE ONLY): 25 min  Charges:  $Gait Training: 8-22 mins $Therapeutic Activity: 8-22 mins                     Lou Miner, DPT  Acute Rehabilitation Services  Pager: [phone] Office: [phone]    Rudean Hitt 02/14/2021, 5:25 PM

## 2021-02-15 LAB — BPAM RBC
Blood Product Expiration Date: 202210232359
Blood Product Expiration Date: 202210312359
ISSUE DATE / TIME: 202210031636
Unit Type and Rh: 600
Unit Type and Rh: 600

## 2021-02-15 LAB — TYPE AND SCREEN
ABO/RH(D): A NEG
Antibody Screen: POSITIVE
Donor AG Type: NEGATIVE
Donor AG Type: NEGATIVE
Unit division: 0
Unit division: 0

## 2021-02-15 LAB — C-REACTIVE PROTEIN: CRP: 5.6 mg/dL — ABNORMAL HIGH (ref <1.0)

## 2021-02-15 MED ORDER — PREGABALIN 25 MG PO CAPS: 25.0000 mg | ORAL_CAPSULE | Freq: Two times a day (BID) | ORAL | 0 refills | Status: DC

## 2021-02-15 MED ORDER — PANTOPRAZOLE SODIUM 40 MG PO TBEC: 40.0000 mg | DELAYED_RELEASE_TABLET | Freq: Two times a day (BID) | ORAL | 0 refills | Status: DC

## 2021-02-15 MED ORDER — ASPIRIN 81 MG PO TBEC: 81.0000 mg | DELAYED_RELEASE_TABLET | Freq: Every day | ORAL | 2 refills | Status: DC

## 2021-02-15 MED ORDER — DOXYCYCLINE HYCLATE 100 MG PO CAPS: 100.0000 mg | ORAL_CAPSULE | Freq: Two times a day (BID) | ORAL | 0 refills | Status: DC

## 2021-02-15 MED ORDER — COLCHICINE 0.6 MG PO TABS: 0.6000 mg | ORAL_TABLET | Freq: Every day | ORAL | 0 refills | Status: DC

## 2021-02-15 MED ORDER — COLCHICINE 0.6 MG PO TABS
0.6000 mg | ORAL_TABLET | Freq: Every day | ORAL | Status: DC
  Administered 2021-02-15: 0.6 mg via ORAL
  Filled 2021-02-15: qty 1

## 2021-02-15 MED ORDER — METHYLPREDNISOLONE SODIUM SUCC 40 MG IJ SOLR
40.0000 mg | Freq: Once | INTRAMUSCULAR | Status: DC
  Filled 2021-02-15: qty 1

## 2021-02-15 MED ORDER — CEPHALEXIN 500 MG PO CAPS: 500.0000 mg | ORAL_CAPSULE | Freq: Two times a day (BID) | ORAL | 0 refills | Status: DC

## 2021-02-15 MED ORDER — FUROSEMIDE 10 MG/ML IJ SOLN: 60.0000 mg | Freq: Once | INTRAMUSCULAR | Status: DC

## 2021-02-15 NOTE — Progress Notes (Signed)
SATURATION QUALIFICATIONS: (This note is used to comply with regulatory documentation for home oxygen)  Patient Saturations on Room Air at Rest = 85%  Patient Saturations on Room Air while Ambulating = 81%  Patient Saturations on 2 Liters of oxygen while Ambulating = 85%  Please briefly explain why patient needs home oxygen: Pt sats decrease further on RA with pt reporting lightheadedness with standing mobility, BP stable.    Moishe Spice, PT, DPT Acute Rehabilitation Services  Pager: [phone] Office: [phone]

## 2021-02-15 NOTE — Care Management Important Message (Signed)
Important Message  Patient Details  Name: NECHAMA ESCUTIA MRN: [id number] Date of Birth: 1934-04-13   Medicare Important Message Given:  Yes     Shelda Altes 02/15/2021, 9:52 AM

## 2021-02-15 NOTE — Progress Notes (Signed)
Discharge instructions provided to patient. Medication changes, incision care and follow-up appointments discussed. All questions answered. IV removed. Home oxygen delivered to room. Patient to be escorted home by her daughter.   Gailen Shelter RN

## 2021-02-15 NOTE — Discharge Summary (Addendum)
[name] KPV:[id number] DOB: 19-Mar-1934 DOA: 02/08/2021  PCP: Lesleigh Noe, MD  Admit date: 02/08/2021  Discharge date: 02/15/2021  Admitted From: Home Disposition:  Home   Recommendations for Outpatient Follow-up:   Follow up with PCP in 1-2 weeks  PCP Please obtain BMP/CBC, 2 view CXR in 1week,  (see Discharge instructions)   PCP Please follow up on the following pending results: monitor L.leg wound, cellulits, BP, CBC, CMP, Uric acid levels closely.   Home Health:  PT, RN Equipment/Devices: as below  Consultations: VVS Discharge Condition: Stable    CODE STATUS: Full    Diet Recommendation: Heart Healthy with 1.5lit/day fluid restriction  Diet Order             Diet regular Room service appropriate? Yes; Fluid consistency: Thin  Diet effective now                    Chief Complaint  Patient presents with   Wound Check     Brief history of present illness from the day of admission and additional interim summary    Patient is a 87 y.o. female with history of A. 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  See below for further details.                                                                   Hospital Course    Sepsis due to left leg cellulitis superimposed on underlying squamous cell carcinoma: Sepsis physiology slowly improving-got emperic IV Cefepime, better, PO ABX x 1 week upon DC.  Vascular surgery did angiogram on 10/10 with Bilateral common iliac artery stenting , currently on aspirin, Eliquis and Crestor for  secondary prevention. Outpt VVS follow up.   Melena-likely slow GI bleeding-with acute on chronic blood loss anemia: Having melanotic stools-2 g drop in hemoglobin-seen by GI, post EGD 02/10/21 by Dr Benson [place]- few non-bleeding angiodysplastic lesions in the stomach. Treated with a monopolar probe. Four bleeding angiodysplastic lesions in the duodenum. Treated with a monopolar probe. A single non-bleeding angiodysplastic lesion in the jejunum. Treated with a monopolar probe, continue PPI, stable H&H. PPI upon DC.   PAD: Vascular surgery following-angiogram planned for 10/10-after Eliquis washout.   Squamous cell carcinoma involving left leg ulcer with ongoing L. Foot pain for mths: Stable for outpatient follow-up with dermatology, could be contributing to the L foot pain, Lyrica added, does not want narcotics or steroids.   Hyperuricemia - follow with PCP, Colchicine daily for now, ? Some L foot pain is gout as well.  Acute hypoxic respiratory failure due to COPD exacerbation:  Resolved with supportive care now and symptom-free on RA at rest, o2 2lit upon ambulation.  Does not want any steroid taper.   CKD stage IIIb: Creatinine close to baseline, on Home Rx, PCP to monitor.   Chronic atrial fibrillation: Paced rhythm-Eliquis .   HFrEF: Euvolemic on exam-her EF has recovered-she has ICD in place. Home Cards Rx continued.   HTN: BP stable, off Meds.   HLD: Continue statin   Hypothyroidism: Continue Synthroid   GERD: Continue PPI    Discharge diagnosis     Principal Problem:   Sepsis due to cellulitis [facility]) Active Problems:   Mixed hyperlipidemia   Non-ischemic cardiomyopathy (HCC)   Permanent atrial fibrillation   COPD (chronic obstructive pulmonary disease) (HCC)   Chronic kidney disease, stage 3b ([place])   ICD (implantable cardioverter-defibrillator) in place   Hypotension    Discharge instructions    Discharge Instructions     Discharge instructions   Complete by: As  directed    Keep your Left leg elevated under 3 pillows when in bed  Follow with Primary MD Lesleigh Noe, MD in 7 days   Get CBC, CMP, 2 view Chest X ray -  checked next visit within 1 week by Primary MD   Activity: As tolerated with Full fall precautions use walker/cane & assistance as needed  Disposition Home    Diet: Heart Healthy with 1.5 lit/day fluid restriction.  Check your Weight same time everyday, if you gain over 2 pounds, or you develop in leg swelling, experience more shortness of breath or chest pain, call your Primary MD immediately. Follow Cardiac Low Salt Diet and 1.5 lit/day fluid restriction.  Special Instructions: If you have smoked or chewed Tobacco  in the last 2 yrs please stop smoking, stop any regular Alcohol  and or any Recreational drug use.  On your next visit with your primary care physician please Get Medicines reviewed and adjusted.  Please request your Prim.MD to go over all Hospital Tests and Procedure/Radiological results at the follow up, please get all Hospital records sent to your Prim MD by signing hospital release before you go home.  If you experience worsening of your admission symptoms, develop shortness of breath, life threatening emergency, suicidal or homicidal thoughts you must seek medical attention immediately by calling 911 or calling your MD immediately  if symptoms less severe.  You Must read complete instructions/literature along with all the possible adverse reactions/side effects for all the Medicines you take and that have been prescribed to you. Take any new Medicines after you have completely understood and accpet all the possible adverse reactions/side effects.   Increase activity slowly   Complete by: As directed        Discharge Medications   Allergies as of 02/15/2021       Reactions   Dilaudid [hydromorphone] Other (See Comments)   Excessive Somnolence- Required Narcan   Fentanyl Other (See Comments)   Required  Narcan when given with Dilaudid. Tolerated single dose when given during procedure 02/03/2019.    Codeine Nausea And Vomiting   Hallucinations    Morphine And Related Nausea And Vomiting   Hallucinations    Whole Blood Itching, Nausea And Vomiting   Pt and family stated pt will receive blood products but had a reaction requiring benadryl on a previous administration.    Sulfonamide Derivatives Other (See Comments)   Dry mouth        Medication List     TAKE these medications  acetaminophen 500 MG tablet Commonly known as: TYLENOL Take 500-1,000 mg by mouth every 8 (eight) hours as needed for mild pain or headache.   apixaban 2.5 MG Tabs tablet Commonly known as: ELIQUIS Take 1 tablet (2.5 mg total) by mouth 2 (two) times daily. Dispense starter pack What changed: additional instructions   aspirin 81 MG EC tablet Take 1 tablet (81 mg total) by mouth daily. Swallow whole. Start taking on: February 16, 2021   AZO Cranberry Gummies 500 MG Chew Generic drug: Cranberry Chew 2,000 mg by mouth daily.   B-12 PO Take 1 tablet by mouth daily.   cephALEXin 500 MG capsule Commonly known as: KEFLEX Take 1 capsule (500 mg total) by mouth 2 (two) times daily.   colchicine 0.6 MG tablet Take 1 tablet (0.6 mg total) by mouth daily.   doxycycline 100 MG capsule Commonly known as: VIBRAMYCIN Take 1 capsule (100 mg total) by mouth 2 (two) times daily. One po bid x 7 days   ezetimibe 10 MG tablet Commonly known as: ZETIA Take 1 tablet (10 mg total) by mouth daily.   ferrous sulfate 325 (65 FE) MG tablet Take 1 tablet (325 mg total) by mouth 2 (two) times daily with a meal.   fluticasone 50 MCG/ACT nasal spray Commonly known as: FLONASE Place 2 sprays into both nostrils daily as needed for allergies or rhinitis.   levothyroxine 25 MCG tablet Commonly known as: SYNTHROID TAKE 1 TABLET BY MOUTH ONCE DAILY BEFOREBREAKFAST What changed: See the new instructions.   losartan 25 MG  tablet Commonly known as: COZAAR Take 0.5 tablets (12.5 mg total) by mouth daily.   magnesium oxide 400 MG tablet Commonly known as: MAG-OX Take 1 tablet (400 mg total) by mouth daily.   metolazone 2.5 MG tablet Commonly known as: ZAROXOLYN Take 2.5 mg by mouth daily as needed (For fluid).   mupirocin ointment 2 % Commonly known as: BACTROBAN Apply 1 application topically daily. Qd to wound on foot What changed: additional instructions   ondansetron 4 MG tablet Commonly known as: Zofran Take 1 tablet (4 mg total) by mouth every 8 (eight) hours as needed for nausea or vomiting.   pantoprazole 40 MG tablet Commonly known as: PROTONIX Take 1 tablet (40 mg total) by mouth 2 (two) times daily before a meal. What changed:  how much to take how to take this when to take this   potassium chloride SA 20 MEQ tablet Commonly known as: Klor-Con M20 Take 2 tablets (40 mEq total) by mouth 2 (two) times daily. Take 1 extra tablet on the days you take metolazone.   pregabalin 25 MG capsule Commonly known as: Lyrica Take 1 capsule (25 mg total) by mouth 2 (two) times daily.   rosuvastatin 40 MG tablet Commonly known as: CRESTOR Take 1 tablet (40 mg total) by mouth daily.   torsemide 100 MG tablet Commonly known as: DEMADEX TAKE 1 TABLET BY MOUTH EVERY MORNING (EVENING DOSE 80 MG) What changed: See the new instructions.   vitamin C 250 MG tablet Commonly known as: ASCORBIC ACID Take 500 mg by mouth daily.   Vitamin D 50 MCG (2000 UT) Caps Take 4,000 Units by mouth daily.               Durable Medical Equipment  (From admission, onward)           Start     Ordered   02/15/21 1102  For home use only DME oxygen  Once  Question Answer Comment  Length of Need 6 Months   Mode or (Route) Nasal cannula   Liters per Minute 2   Frequency Continuous (stationary and portable oxygen unit needed)   Oxygen conserving device Yes   Oxygen delivery system Gas       02/15/21 1101   02/14/21 1131  For home use only DME Walker rolling  Once       Comments: 5 wheel  Question Answer Comment  Walker: With 5 Inch Wheels   Patient needs a walker to treat with the following condition Weakness      02/14/21 1130             Follow-up Information     [facility] Follow up in 1 month(s).   Why: The office will call the patient with an appointment Contact information: [address] [place] [phone]        Lesleigh Noe, MD. Schedule an appointment as soon as possible for a visit in 1 week(s).   Specialty: Family Medicine Why: follow with your skin and cancer MD in 1 week Contact information: [place] [address] [phone]         Deboraha Sprang, MD .   Specialty: Cardiology Contact information: [address] [place] [address] [phone]         Larey Dresser, MD. Schedule an appointment as soon as possible for a visit in 1 week(s).   Specialty: Cardiology Contact information: [place] [address] [phone]                 Major procedures and Radiology Reports - PLEASE review detailed and final reports thoroughly  -      10/ 8EGD   Impression:               - Normal esophagus.                           - A few non-bleeding angiodysplastic lesions in the                            stomach. Treated with a monopolar probe.                           - Four bleeding angiodysplastic lesions in the                            duodenum. Treated with a monopolar probe.                           - A single non-bleeding angiodysplastic lesion in                            the jejunum. Treated with a monopolar probe.                           - No specimens collected. Recommendation:           - Return patient to hospital ward for ongoing care.                           -  Resume regular diet.                            - Restart Eliquis in 3 days.                           - Follow HGB and transfuse as necessary.     02/12/2021 Pre-operative Diagnosis: Chronic limb threatening ischemia with ulceration left lower extremity Post-operative diagnosis:  Same Surgeon:  Erlene Quan C. Donzetta Matters, MD Procedure Performed: 1.  Ultrasound-guided cannulation bilateral common femoral arteries 2.  CO2 aortogram 3.  Selection of left common femoral artery and left lower extremity angiogram 4.  Bilateral common iliac artery stenting with 8 x 29 mm VBX 5.  Mynx device closure bilateral common femoral arteries 6.  Moderate sedation with Versed for 59 minutes   DG Ankle Complete Left  Result Date: 02/08/2021 CLINICAL DATA:  Left lateral foot wound, left ankle pain. EXAM: LEFT ANKLE COMPLETE - 3+ VIEW COMPARISON:  Foot radiograph 11/10/2019 FINDINGS: Mild likely chronic periosteal reaction along the distal tibia medially, probably from chronic venous insufficiency. No fracture or acute bony findings. No bony destructive findings characteristic of osteomyelitis, I do note that the posterior portion of the calcaneus was excluded on the lateral projection and if this is a specific area of concern thin please notify the radiology department and we can attempt another lateral view. There is soft tissue swelling and subcutaneous edema along the lower calf and ankle. IMPRESSION: 1. Diffuse subcutaneous edema. 2. No bony destructive findings characteristic of osteomyelitis in the ankle region. Please note the posterior calcaneus is excluded on the lateral projection. 3. Likely chronic periosteal reaction along the distal tibia, most likely from chronic venous insufficiency. Electronically Signed   By: Van Clines M.D.   On: 02/08/2021 12:10   PERIPHERAL VASCULAR CATHETERIZATION  Result Date: 02/12/2021 Images from the original result were not included. Patient name: [name] MRN: [id number] DOB: November 12, 1933 Sex: female  02/12/2021 Pre-operative Diagnosis: Chronic limb threatening ischemia with ulceration left lower extremity Post-operative diagnosis:  Same Surgeon:  Erlene Quan C. Donzetta Matters, MD Procedure Performed: 1.  Ultrasound-guided cannulation bilateral common femoral arteries 2.  CO2 aortogram 3.  Selection of left common femoral artery and left lower extremity angiogram 4.  Bilateral common iliac artery stenting with 8 x 29 mm VBX 5.  Mynx device closure bilateral common femoral arteries 6.  Moderate sedation with Versed for 59 minutes Indications: 87 year old female with a history of squamous cell carcinoma of her left foot.  She underwent biopsy which is failed to heal she has chronic limb threatening ischemia she is indicated for angiography with possible intervention.  She does have elevated creatinine we are planning to use CO2. Findings: Bilateral common femoral arteries were small but free of disease.  The aorta was approximately patent both renal arteries appeared to fill with CO2.  The IMA was large.  Distally the aorta tapered down with a napkin ring type high-grade stenosis.  This was at least 80 to 90% stenosis.  Bilateral common iliac arteries then were patent.  We selected a left common femoral artery and there was what appeared to be inline flow with dominant via the posterior tibial artery with CO2.  After stenting the high-grade stenosis of the distal aorta is resolved.  The bifurcation was raised.  There was no gradient from the aorta to the bilateral common femoral sheaths at completion.  Procedure:  The patient was identified in the holding area and taken to room 8.  The patient was then placed supine on the table and prepped and draped in the usual sterile fashion.  A time out was called.  Ultrasound was used to evaluate the first right common femoral artery.  There is anesthetized 1% lidocaine cannulated micropuncture needle followed by wire and sheath.  We placed a 5 French sheath and Bentson wire and pigtail  catheter into the aorta.  CO2 aortogram was performed.  We then crossed the bifurcation placed a straight catheter perform left lower extremity angiography with CO2.  With the above findings we elected to intervene on the tight distal aortic stenosis.  We replaced the pigtail catheter.  We performed contrasted angiography.  We then evaluated the left common femoral artery.  This was noted to be patent compressible.  The areas Him stenotic and cannulated micropuncture needle followed by wire and sheath there.  We then placed a Rosen wire followed by a long 7 French sheath to the aorta.  On the right side we placed a Rosen wire placed a long 7 [place] sheath patient was given 5000 units of heparin.  We then performed aortogram to mark our stenosis.  We brought to 8 x 29 mm VBX to the field deployed them simultaneously.  Completion angiography from either side demonstrated patency however the limbs are crossing was difficult to see both at the same time.  We then performed pullback gradients bilaterally which demonstrated no arterial gradient.  Satisfied with this we placed a Rosen wire was exchanged for short 7 French sheath deployed next device bilaterally.  She tolerated procedure without any complication Contrast: 80DX Brandon C. Donzetta Matters, MD [facility] [place] Office: [phone] Pager: [phone]   [facility] Chest Port 1 View  Result Date: 02/08/2021 CLINICAL DATA:  87 year old female with history of fever. EXAM: PORTABLE CHEST 1 VIEW COMPARISON:  Chest x-ray 05/09/2020. FINDINGS: Lung volumes are normal. No consolidative airspace disease. No pleural effusions. No pneumothorax. Multiple small calcified granulomas are noted throughout the lungs bilaterally. No other definite suspicious appearing pulmonary nodules or masses are noted. No evidence of pulmonary edema. Heart size is mildly enlarged. Upper mediastinal contours are within normal limits. Atherosclerotic calcifications are noted in the  thoracic aorta. Left-sided pacemaker/AICD with lead tips projecting over the expected location of the right atrium and right ventricle. IMPRESSION: 1. No radiographic evidence of acute cardiopulmonary disease. 2. Aortic atherosclerosis. 3. Mild cardiomegaly. Electronically Signed   By: Vinnie Langton M.D.   On: 02/08/2021 05:06   VAS [place] ABI WITH/WO TBI  Result Date: 02/08/2021  LOWER EXTREMITY DOPPLER STUDY Patient Name:  [name]  Date of Exam:   02/08/2021 Medical Rec #: [id number]          Accession #:    [id number] Date of Birth: Sep 05, 1933          Patient Gender: F Patient Age:   10 years Exam Location:  [facility] Procedure:      VAS [place] ABI WITH/WO TBI Referring Phys: Gareth [name] --------------------------------------------------------------------------------  Indications: Ulceration. High Risk Factors: Hypertension, hyperlipidemia.  Limitations: Today's exam was limited due to patient intolerant to cuff              pressure. Comparison Study: 03/03/2019 - Unable to get pressures. Waveforms and toe                   pressures are abnormal  Summary:                   Right: The right toe-brachial index is abnormal.                    Unable to get pressures. Waveforms and toe pressures are                   abnormal. Performing Technologist: Carlos Levering RVT  Examination Guidelines: A complete evaluation includes at minimum, Doppler waveform signals and systolic blood pressure reading at the level of bilateral brachial, anterior tibial, and posterior tibial arteries, when vessel segments are accessible. Bilateral testing is considered an integral part of a complete examination. Photoelectric Plethysmograph (PPG) waveforms and toe systolic pressure readings are included as required and additional duplex testing as needed. Limited examinations for reoccurring indications may be performed as noted.  ABI Findings: +--------+------------------+-----+----------+--------+  Right   Rt Pressure (mmHg)IndexWaveform  Comment  +--------+------------------+-----+----------+--------+ [id number]                    triphasic          +--------+------------------+-----+----------+--------+ PTA     84                0.67 monophasic         +--------+------------------+-----+----------+--------+ DP      61                0.49 monophasic         +--------+------------------+-----+----------+--------+ +--------+------------------+-----+----------+-------+ Left    Lt Pressure (mmHg)IndexWaveform  Comment +--------+------------------+-----+----------+-------+ [id number]                    triphasic         +--------+------------------+-----+----------+-------+ PTA     64                0.51 monophasic        +--------+------------------+-----+----------+-------+ DP      60                0.48 monophasic        +--------+------------------+-----+----------+-------+ +-------+-----------+-----------+------------+------------+ ABI/TBIToday's ABIToday's TBIPrevious ABIPrevious TBI +-------+-----------+-----------+------------+------------+ Right  0.67                                           +-------+-----------+-----------+------------+------------+ Left   0.51                                           +-------+-----------+-----------+------------+------------+  Summary: Right: Resting right ankle-brachial index indicates moderate right lower extremity arterial disease. Unable to obtain TBI due to low amplitude waveforms. Left: Resting left ankle-brachial index indicates moderate left lower extremity arterial disease. Unable to obtain TBI due to low amplitude waveforms.  *See table(s) above for measurements and observations.  Electronically signed by Jamelle Haring on 02/08/2021 at 6:23:35 PM.    Final    VAS [place] LOWER EXTREMITY VENOUS (DVT)  Result Date: 01/23/2021  Lower Venous DVT Study Patient Name:  [name]  Date of  Exam:   01/23/2021 Medical Rec #: [id number]          Accession #:    [id number] Date of Birth: 09/20/1933          Patient Gender:  F Patient Age:   72 years Exam Location:  [facility] Procedure:      VAS [place] LOWER EXTREMITY VENOUS (DVT) Referring Phys: DANIEL CAFFREY --------------------------------------------------------------------------------  Indications: Pain, and Swelling. Other Indications: Skin cancer removed from left foot two weeks ago. Risk Factors: Venous stasis BLE. Comparison Study: Previous exam on 11/04/2018 negative for DVT Performing Technologist: Jody Hill RVT, RDMS  Examination Guidelines: A complete evaluation includes B-mode imaging, spectral Doppler, color Doppler, and power Doppler as needed of all accessible portions of each vessel. Bilateral testing is considered an integral part of a complete examination. Limited examinations for reoccurring indications may be performed as noted. The reflux portion of the exam is performed with the patient in reverse Trendelenburg.  +-----+---------------+---------+-----------+----------+--------------+ RIGHTCompressibilityPhasicitySpontaneityPropertiesThrombus Aging +-----+---------------+---------+-----------+----------+--------------+ CFV  Full           Yes      Yes                                 +-----+---------------+---------+-----------+----------+--------------+   +---------+---------------+---------+-----------+----------+--------------+ LEFT     CompressibilityPhasicitySpontaneityPropertiesThrombus Aging +---------+---------------+---------+-----------+----------+--------------+ CFV      Full           Yes      Yes                                 +---------+---------------+---------+-----------+----------+--------------+ SFJ      Full                                                        +---------+---------------+---------+-----------+----------+--------------+ FV Prox  Full           Yes      Yes                                  +---------+---------------+---------+-----------+----------+--------------+ FV Mid   Full           Yes      Yes                                 +---------+---------------+---------+-----------+----------+--------------+ FV DistalFull           Yes      Yes                                 +---------+---------------+---------+-----------+----------+--------------+ PFV      Full                                                        +---------+---------------+---------+-----------+----------+--------------+ POP      Full           Yes      Yes                                 +---------+---------------+---------+-----------+----------+--------------+ PTV  Full                                                        +---------+---------------+---------+-----------+----------+--------------+ PERO     Full                                                        +---------+---------------+---------+-----------+----------+--------------+     Summary: RIGHT: - No evidence of common femoral vein obstruction.  LEFT: - There is no evidence of deep vein thrombosis in the lower extremity. - There is no evidence of superficial venous thrombosis.  - No cystic structure found in the popliteal fossa. Subcutaneous edema of calf and ankle.  *See table(s) above for measurements and observations. Electronically signed by Deitra Mayo MD on 01/23/2021 at 4:03:41 PM.    Final       Today   Subjective    [name] today has no headache,no chest abdominal pain,no new weakness tingling or numbness, feels much better wants to go home today.    Objective   Blood pressure (!) 127/52, pulse 65, temperature 98.6 F (37 C), temperature source Oral, resp. rate 18, height 5' 6.5" (1.689 m), weight 76.7 kg, SpO2 91 %.   Intake/Output Summary (Last 24 hours) at 02/15/2021 1105 Last data filed at 02/15/2021 0838 Gross per 24 hour  Intake --   Output 150 ml  Net -150 ml    Exam  Awake Alert, No new F.N deficits, Normal affect [place].AT,PERRAL Supple Neck,No JVD, No cervical lymphadenopathy appriciated.  Symmetrical Chest wall movement, Good air movement bilaterally, CTAB RRR,No Gallops,Rubs or new Murmurs, No Parasternal Heave +ve B.Sounds, Abd Soft, Non tender, No organomegaly appriciated, No rebound -guarding or rigidity. No Cyanosis, L leg 1+ edema with redness but improved      Data Review   CBC w Diff:  Lab Results  Component Value Date   WBC 4.4 02/13/2021   HGB 9.2 (L) 02/13/2021   HGB 8.1 (L) 03/22/2019   HCT 29.7 (L) 02/13/2021   HCT 25.9 (L) 03/22/2019   PLT 185 02/13/2021   PLT 226 03/22/2019   LYMPHOPCT 13 02/13/2021   MONOPCT 15 02/13/2021   EOSPCT 1 02/13/2021   BASOPCT 1 02/13/2021    CMP:  Lab Results  Component Value Date   NA 136 02/13/2021   NA 141 07/14/2019   K 3.9 02/13/2021   CL 96 (L) 02/13/2021   CO2 27 02/13/2021   BUN 36 (H) 02/13/2021   BUN 29 (A) 07/14/2019   CREATININE 1.50 (H) 02/13/2021   CREATININE 1.34 (H) 03/19/2011   GLU 75 07/14/2019   PROT 6.2 (L) 02/13/2021   PROT 7.2 06/09/2014   ALBUMIN 3.3 (L) 02/13/2021   ALBUMIN 3.8 06/09/2014   BILITOT 1.0 02/13/2021   BILITOT 0.3 06/09/2014   ALKPHOS 55 02/13/2021   ALKPHOS 71 06/09/2014   AST 34 02/13/2021   AST 24 06/09/2014   ALT 28 02/13/2021   ALT 23 06/09/2014  .   Total Time in preparing paper work, data evaluation and todays exam - 66 minutes  Lala Lund M.D on 02/15/2021 at 11:05 AM  [facility]

## 2021-02-15 NOTE — TOC Transition Note (Signed)
Transition of Care (TOC) - CM/SW Discharge Note Marvetta Gibbons RN, BSN Transitions of Care Unit 4E- RN Case Manager See Treatment Team for direct phone #    Patient Details  Name: [name] MRN: [id number] Date of Birth: February 13, 1934  Transition of Care [facility]) CM/SW Contact:  Dawayne Patricia, RN Phone Number: 02/15/2021, 11:38 AM   Clinical Narrative:    Pt stable for transition home today, noted orders for [facility] and DME. Call made to pt's room to discuss transition needs.  Per pt she has needed DME at home including RW- declines RW for discharge- Pt does ask about shower chair- explained that insurance does not cover- and this is out of pocket cost- pt states she will purchase locally as well as a pulse ox for home.   Discussed HH needs- choice offered Per CMS guidelines from medicare.gov website with star ratings (copy placed in shadow chart) , per pt she has used [facility] in the past and would like to use them again for [facility] needs.  Address, phone #s and PCP all confirmed with pt in epic.  Family to transport home.   Call made to [facility] with [facility]- they are able to accept referral- however are low in nursing staff- asking if they can start with PT and add nursing later if needed. Msg  sent to attending MD who states he is ok with just HHPT for initial start. Pt also updated and is agreeable. - [place] referral has been accepted for HHPT start.   Noted order also placed for Home 02- pt offered choice for agency and has chosen [employer]. Call made to [facility] for home 02 referral- once insurance approved- portable tank to be delivered to room for transport home- and then home equipment to be delivered later today after pt gets home.      Final next level of care: [place] Barriers to Discharge: No Barriers Identified   Patient Goals and CMS Choice Patient states their goals for this hospitalization and ongoing recovery are:: return home CMS Medicare.gov Compare  Post Acute Care list provided to:: Patient Choice offered to / list presented to : Patient  Discharge Placement               Home w/ [facility]        Discharge Plan and Services   Discharge Planning Services: CM Consult Post Acute Care Choice: Home Health, Durable Medical Equipment          DME Arranged: Oxygen DME Agency: [facility] Date DME Agency Contacted: 02/15/21 Time DME Agency Contacted: [id number] Representative spoke with at DME Agency: [place]: PT [place]: [facility] [place] Date [place]: 02/15/21 Time [place]: 1000 Representative spoke with at [place]: [place] ([place]) Interventions     Readmission Risk Interventions Readmission Risk Prevention Plan 02/15/2021 03/31/2020 03/17/2019  Transportation Screening Complete Complete Complete  PCP or Specialist Appt within 5-7 Days Complete - -  PCP or Specialist Appt within 3-5 Days - - -  Home Care Screening Complete - -  Medication Review (RN CM) Complete - -  [place]RI or [place] for [place] - - -  Medication Review (RN Care Manager) - - Complete  PCP or Specialist appointment within 3-5 days of discharge - Not Complete Complete  PCP/Specialist Appt Not Complete comments - office closed for  Thanksgiving holiday -  HRI or Home Care Consult - Complete Complete  SW Recovery Care/Counseling Consult - Complete Complete  Palliative Care Screening - Not Applicable Not Applicable  Skilled Nursing Facility - Not Applicable Not Applicable  Some recent data might be hidden

## 2021-02-15 NOTE — Progress Notes (Signed)
Physical Therapy Treatment Patient Details Name: [name] MRN: [id number] DOB: 1933-09-27 Today's Date: 02/15/2021   History of Present Illness pt is an 87 y/o female admitted 10/6 with complaints of worsening L foot pain.  On 10/10 pt underwent angiogram and bilateral iliac stenting. PMHx:  afib, dCHF, severe tricuspid regurgitation, NICM s/p ICD, COPD, PAD, CKD3, RCC s/p nephrectomy, ischemic colitis.    PT Comments    Pt continues to be limited in mobility by aerobic endurance, with her sats decreasing to 81% on RA with symptoms of feeling lightheaded and to 85% on 2L with improved symptoms. She was able to progress to ambulating up to ~80 ft with a RW and min guard assist, x2 bouts this date. She is able to negotiate tight spaces safely. Educated pt on monitoring her sats and symptoms to gauge her activity but to continue to safely progress her mobility also. Will continue to follow acutely. Current recommendations remain appropriate.   Recommendations for follow up therapy are one component of a multi-disciplinary discharge planning process, led by the attending physician.  Recommendations may be updated based on patient status, additional functional criteria and insurance authorization.  Follow Up Recommendations  Home health PT     Equipment Recommendations  None recommended by PT    Recommendations for Other Services       Precautions / Restrictions Precautions Precautions: Fall;Other (comment) Precaution Comments: watch o2, lots of nausea Restrictions Weight Bearing Restrictions: No     Mobility  Bed Mobility               General bed mobility comments: pt sitting EOB upon arrival    Transfers Overall transfer level: Needs assistance Equipment used: Rolling walker (2 wheeled) Transfers: Sit to/from Stand Sit to Stand: Min guard         General transfer comment: Min guard for safety to stand with RW, x2 attempts before successful in coming to  stand from EOB the first rep. x1 attempt with success on 2nd rep. Cues provided to reach back for surface prior to sitting.  Ambulation/Gait Ambulation/Gait assistance: Min guard Gait Distance (Feet): 80 Feet (x2 bouts of ~80 ft each bout) Assistive device: Rolling walker (2 wheeled) Gait Pattern/deviations: Step-through pattern;Decreased stance time - left;Decreased stride length Gait velocity: decr Gait velocity interpretation: <1.31 ft/sec, indicative of household ambulator General Gait Details: Pt lightheaded with standing and ambulation, BP noted to be stable but sats decreasing to 81% on RA, 85% on 2L O2. No LOB, able to negotiate tight spaces and lift RW over obstacles without LOB.   Stairs             Wheelchair Mobility    Modified Rankin (Stroke Patients Only)       Balance Overall balance assessment: Needs assistance Sitting-balance support: No upper extremity supported;Feet supported Sitting balance-Leahy Scale: Good     Standing balance support: During functional activity;No upper extremity supported;Bilateral upper extremity supported Standing balance-Leahy Scale: Fair Standing balance comment: Able to maintain static standing without UE support but reliant on bil UE support for mobility.                            Cognition Arousal/Alertness: Awake/alert Behavior During Therapy: WFL for tasks assessed/performed Overall Cognitive Status: Within Functional Limits for tasks assessed  Exercises      General Comments General comments (skin integrity, edema, etc.): SpO2 85% on RA at rest and as low as 81% on RA when mobilizing, 85% on 2L when mobilizing, BP noted to be stable with BP increasing with standing compared to in sitting following a gait bout; educated pt on pursed lip breathing, energy conservation techniques, monitoring symptoms to gauge activity and when to sit, and progressing  activity safely      Pertinent Vitals/Pain Pain Assessment: Faces Faces Pain Scale: Hurts little more Pain Location: L foot Pain Descriptors / Indicators: Grimacing;Guarding Pain Intervention(s): Limited activity within patient's tolerance;Monitored during session;Repositioned    Home Living                      Prior Function            PT Goals (current goals can now be found in the care plan section) Acute Rehab PT Goals Patient Stated Goal: to go home PT Goal Formulation: With patient Time For Goal Achievement: 02/25/21 Potential to Achieve Goals: Good Progress towards PT goals: Progressing toward goals    Frequency    Min 3X/week      PT Plan Current plan remains appropriate    Co-evaluation              AM-PAC PT "6 Clicks" Mobility   Outcome Measure  Help needed turning from your back to your side while in a flat bed without using bedrails?: A Little Help needed moving from lying on your back to sitting on the side of a flat bed without using bedrails?: A Little Help needed moving to and from a bed to a chair (including a wheelchair)?: A Little Help needed standing up from a chair using your arms (e.g., wheelchair or bedside chair)?: A Little Help needed to walk in hospital room?: A Little Help needed climbing 3-5 steps with a railing? : A Little 6 Click Score: 18    End of Session Equipment Utilized During Treatment: Gait belt;Oxygen Activity Tolerance: Patient limited by fatigue Patient left: with call bell/phone within reach;in bed Nurse Communication: Mobility status;Other (comment) (sats) PT Visit Diagnosis: Unsteadiness on feet (R26.81);Other abnormalities of gait and mobility (R26.89);Difficulty in walking, not elsewhere classified (R26.2);Muscle weakness (generalized) (M62.81)     Time: 1001-1031 PT Time Calculation (min) (ACUTE ONLY): 30 min  Charges:  $Gait Training: 23-37 mins                     Moishe Spice, PT,  DPT Acute Rehabilitation Services  Pager: [phone] Office: [place] 02/15/2021, 10:41 AM

## 2021-02-16 DIAGNOSIS — I5033 Acute on chronic diastolic (congestive) heart failure: Secondary | ICD-10-CM | POA: Diagnosis not present

## 2021-02-16 DIAGNOSIS — Z4789 Encounter for other orthopedic aftercare: Secondary | ICD-10-CM | POA: Diagnosis not present

## 2021-02-16 DIAGNOSIS — J449 Chronic obstructive pulmonary disease, unspecified: Secondary | ICD-10-CM | POA: Diagnosis not present

## 2021-02-16 DIAGNOSIS — R2689 Other abnormalities of gait and mobility: Secondary | ICD-10-CM | POA: Diagnosis not present

## 2021-02-16 DIAGNOSIS — M6281 Muscle weakness (generalized): Secondary | ICD-10-CM | POA: Diagnosis not present

## 2021-02-16 DIAGNOSIS — J90 Pleural effusion, not elsewhere classified: Secondary | ICD-10-CM | POA: Diagnosis not present

## 2021-02-16 NOTE — Telephone Encounter (Signed)
2nd attempt to reach pt by phone unsuccessful. Will try pt again later.

## 2021-02-17 DIAGNOSIS — E039 Hypothyroidism, unspecified: Secondary | ICD-10-CM | POA: Diagnosis not present

## 2021-02-17 DIAGNOSIS — Z85528 Personal history of other malignant neoplasm of kidney: Secondary | ICD-10-CM | POA: Diagnosis not present

## 2021-02-17 DIAGNOSIS — Z7982 Long term (current) use of aspirin: Secondary | ICD-10-CM | POA: Diagnosis not present

## 2021-02-17 DIAGNOSIS — I428 Other cardiomyopathies: Secondary | ICD-10-CM | POA: Diagnosis not present

## 2021-02-17 DIAGNOSIS — Z95810 Presence of automatic (implantable) cardiac defibrillator: Secondary | ICD-10-CM | POA: Diagnosis not present

## 2021-02-17 DIAGNOSIS — J449 Chronic obstructive pulmonary disease, unspecified: Secondary | ICD-10-CM | POA: Diagnosis not present

## 2021-02-17 DIAGNOSIS — Z7901 Long term (current) use of anticoagulants: Secondary | ICD-10-CM | POA: Diagnosis not present

## 2021-02-17 DIAGNOSIS — Z48812 Encounter for surgical aftercare following surgery on the circulatory system: Secondary | ICD-10-CM | POA: Diagnosis not present

## 2021-02-17 DIAGNOSIS — Z95820 Peripheral vascular angioplasty status with implants and grafts: Secondary | ICD-10-CM | POA: Diagnosis not present

## 2021-02-17 DIAGNOSIS — I872 Venous insufficiency (chronic) (peripheral): Secondary | ICD-10-CM | POA: Diagnosis not present

## 2021-02-17 DIAGNOSIS — C44729 Squamous cell carcinoma of skin of left lower limb, including hip: Secondary | ICD-10-CM | POA: Diagnosis not present

## 2021-02-17 DIAGNOSIS — A419 Sepsis, unspecified organism: Secondary | ICD-10-CM | POA: Diagnosis not present

## 2021-02-17 DIAGNOSIS — E782 Mixed hyperlipidemia: Secondary | ICD-10-CM | POA: Diagnosis not present

## 2021-02-17 DIAGNOSIS — L03116 Cellulitis of left lower limb: Secondary | ICD-10-CM | POA: Diagnosis not present

## 2021-02-17 DIAGNOSIS — I5022 Chronic systolic (congestive) heart failure: Secondary | ICD-10-CM | POA: Diagnosis not present

## 2021-02-17 DIAGNOSIS — J441 Chronic obstructive pulmonary disease with (acute) exacerbation: Secondary | ICD-10-CM | POA: Diagnosis not present

## 2021-02-17 DIAGNOSIS — I130 Hypertensive heart and chronic kidney disease with heart failure and stage 1 through stage 4 chronic kidney disease, or unspecified chronic kidney disease: Secondary | ICD-10-CM | POA: Diagnosis not present

## 2021-02-17 DIAGNOSIS — I739 Peripheral vascular disease, unspecified: Secondary | ICD-10-CM | POA: Diagnosis not present

## 2021-02-17 DIAGNOSIS — K219 Gastro-esophageal reflux disease without esophagitis: Secondary | ICD-10-CM | POA: Diagnosis not present

## 2021-02-17 DIAGNOSIS — Z905 Acquired absence of kidney: Secondary | ICD-10-CM | POA: Diagnosis not present

## 2021-02-17 DIAGNOSIS — I361 Nonrheumatic tricuspid (valve) insufficiency: Secondary | ICD-10-CM | POA: Diagnosis not present

## 2021-02-17 DIAGNOSIS — I4821 Permanent atrial fibrillation: Secondary | ICD-10-CM | POA: Diagnosis not present

## 2021-02-17 DIAGNOSIS — Z792 Long term (current) use of antibiotics: Secondary | ICD-10-CM | POA: Diagnosis not present

## 2021-02-17 DIAGNOSIS — N1832 Chronic kidney disease, stage 3b: Secondary | ICD-10-CM | POA: Diagnosis not present

## 2021-02-19 ENCOUNTER — Encounter (HOSPITAL_COMMUNITY): Payer: PPO | Admitting: Cardiology

## 2021-02-20 ENCOUNTER — Ambulatory Visit: Payer: PPO | Admitting: Physician Assistant

## 2021-02-22 DIAGNOSIS — C44729 Squamous cell carcinoma of skin of left lower limb, including hip: Secondary | ICD-10-CM | POA: Diagnosis not present

## 2021-02-22 DIAGNOSIS — I872 Venous insufficiency (chronic) (peripheral): Secondary | ICD-10-CM | POA: Diagnosis not present

## 2021-02-23 ENCOUNTER — Telehealth: Payer: Self-pay | Admitting: Family Medicine

## 2021-02-23 NOTE — Telephone Encounter (Signed)
My chart to pt

## 2021-02-23 NOTE — Telephone Encounter (Signed)
Pt called in stating that a well care nurse will draw blood at her home if the provider make an order for it  because pt not feeling well to come to the office to do it.

## 2021-02-27 ENCOUNTER — Inpatient Hospital Stay: Payer: PPO | Admitting: Family Medicine

## 2021-02-27 NOTE — Telephone Encounter (Signed)
Pt called in stated she would like a call back she has her labs orders for the well care nurse . Please advise [phone]

## 2021-02-27 NOTE — Telephone Encounter (Signed)
Attempted to call pt and line was busy. Will try again shortly.

## 2021-02-28 ENCOUNTER — Telehealth: Payer: Self-pay | Admitting: Family Medicine

## 2021-02-28 DIAGNOSIS — Z48812 Encounter for surgical aftercare following surgery on the circulatory system: Secondary | ICD-10-CM | POA: Diagnosis not present

## 2021-02-28 DIAGNOSIS — Z95810 Presence of automatic (implantable) cardiac defibrillator: Secondary | ICD-10-CM | POA: Diagnosis not present

## 2021-02-28 DIAGNOSIS — I739 Peripheral vascular disease, unspecified: Secondary | ICD-10-CM | POA: Diagnosis not present

## 2021-02-28 DIAGNOSIS — Z905 Acquired absence of kidney: Secondary | ICD-10-CM | POA: Diagnosis not present

## 2021-02-28 DIAGNOSIS — Z792 Long term (current) use of antibiotics: Secondary | ICD-10-CM | POA: Diagnosis not present

## 2021-02-28 DIAGNOSIS — Z95820 Peripheral vascular angioplasty status with implants and grafts: Secondary | ICD-10-CM | POA: Diagnosis not present

## 2021-02-28 DIAGNOSIS — K219 Gastro-esophageal reflux disease without esophagitis: Secondary | ICD-10-CM | POA: Diagnosis not present

## 2021-02-28 DIAGNOSIS — Z85528 Personal history of other malignant neoplasm of kidney: Secondary | ICD-10-CM | POA: Diagnosis not present

## 2021-02-28 DIAGNOSIS — I428 Other cardiomyopathies: Secondary | ICD-10-CM | POA: Diagnosis not present

## 2021-02-28 DIAGNOSIS — E039 Hypothyroidism, unspecified: Secondary | ICD-10-CM | POA: Diagnosis not present

## 2021-02-28 DIAGNOSIS — I361 Nonrheumatic tricuspid (valve) insufficiency: Secondary | ICD-10-CM | POA: Diagnosis not present

## 2021-02-28 DIAGNOSIS — I5022 Chronic systolic (congestive) heart failure: Secondary | ICD-10-CM | POA: Diagnosis not present

## 2021-02-28 DIAGNOSIS — C44729 Squamous cell carcinoma of skin of left lower limb, including hip: Secondary | ICD-10-CM | POA: Diagnosis not present

## 2021-02-28 DIAGNOSIS — I4821 Permanent atrial fibrillation: Secondary | ICD-10-CM | POA: Diagnosis not present

## 2021-02-28 DIAGNOSIS — I872 Venous insufficiency (chronic) (peripheral): Secondary | ICD-10-CM | POA: Diagnosis not present

## 2021-02-28 DIAGNOSIS — Z7982 Long term (current) use of aspirin: Secondary | ICD-10-CM | POA: Diagnosis not present

## 2021-02-28 DIAGNOSIS — N1832 Chronic kidney disease, stage 3b: Secondary | ICD-10-CM | POA: Diagnosis not present

## 2021-02-28 DIAGNOSIS — Z7901 Long term (current) use of anticoagulants: Secondary | ICD-10-CM | POA: Diagnosis not present

## 2021-02-28 DIAGNOSIS — L03116 Cellulitis of left lower limb: Secondary | ICD-10-CM | POA: Diagnosis not present

## 2021-02-28 DIAGNOSIS — J441 Chronic obstructive pulmonary disease with (acute) exacerbation: Secondary | ICD-10-CM | POA: Diagnosis not present

## 2021-02-28 DIAGNOSIS — J449 Chronic obstructive pulmonary disease, unspecified: Secondary | ICD-10-CM | POA: Diagnosis not present

## 2021-02-28 DIAGNOSIS — I130 Hypertensive heart and chronic kidney disease with heart failure and stage 1 through stage 4 chronic kidney disease, or unspecified chronic kidney disease: Secondary | ICD-10-CM | POA: Diagnosis not present

## 2021-02-28 DIAGNOSIS — A419 Sepsis, unspecified organism: Secondary | ICD-10-CM | POA: Diagnosis not present

## 2021-02-28 DIAGNOSIS — E782 Mixed hyperlipidemia: Secondary | ICD-10-CM | POA: Diagnosis not present

## 2021-02-28 NOTE — Telephone Encounter (Signed)
Pt called stating that the blood work she needs is a CBC and CMP. Pt would like it fax to her nurse Rich Reining [phone].

## 2021-03-01 DIAGNOSIS — I872 Venous insufficiency (chronic) (peripheral): Secondary | ICD-10-CM | POA: Diagnosis not present

## 2021-03-01 NOTE — Telephone Encounter (Signed)
Called patient and got her in for Monday at 1220

## 2021-03-02 ENCOUNTER — Telehealth: Payer: Self-pay | Admitting: Family Medicine

## 2021-03-02 NOTE — Telephone Encounter (Signed)
[name] with [facility] called asking for verbal order to change visit to next week 03/08/21. Please advise.

## 2021-03-05 ENCOUNTER — Telehealth: Payer: PPO | Admitting: Family Medicine

## 2021-03-05 ENCOUNTER — Other Ambulatory Visit: Payer: Self-pay

## 2021-03-05 IMAGING — DX DG CHEST 2V
2 series · 2 of 2 positions shown · non-contrast
Comparison: 03/27/2020

CLINICAL DATA: Heart failure

EXAM:
CHEST - 2 VIEW

[chest pa]
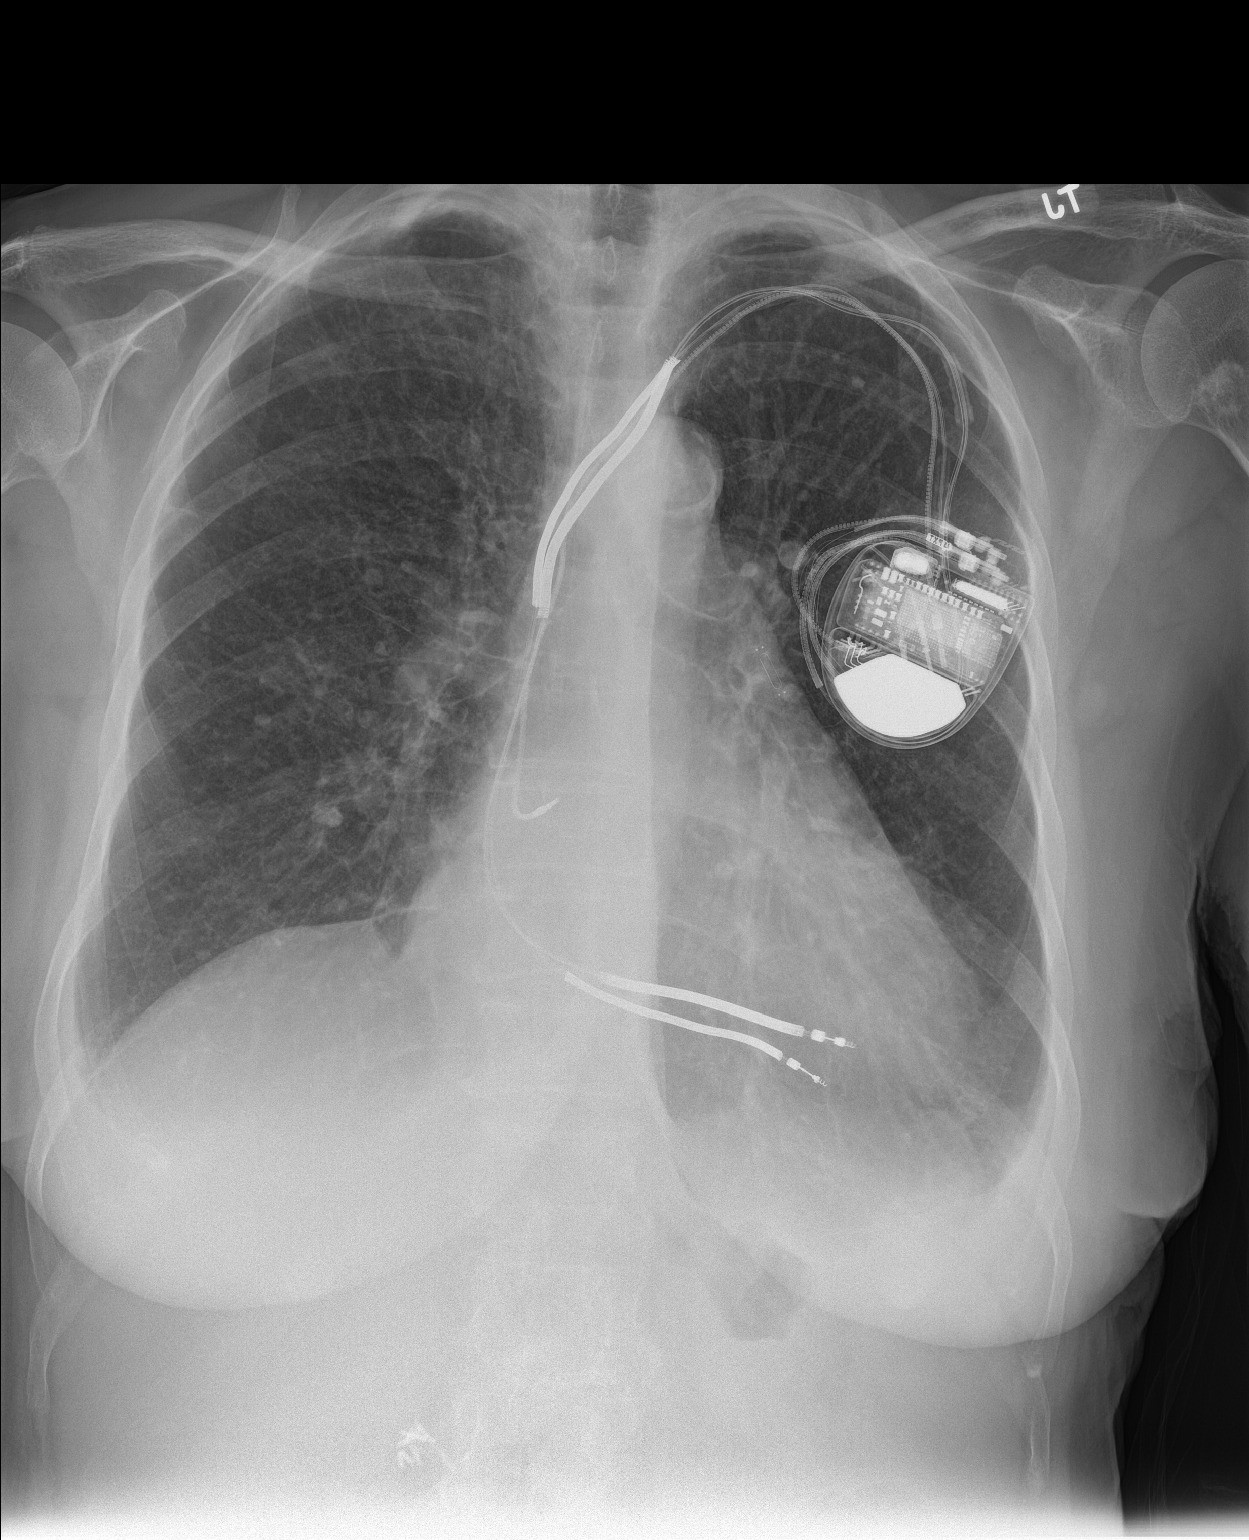

[chest lat]
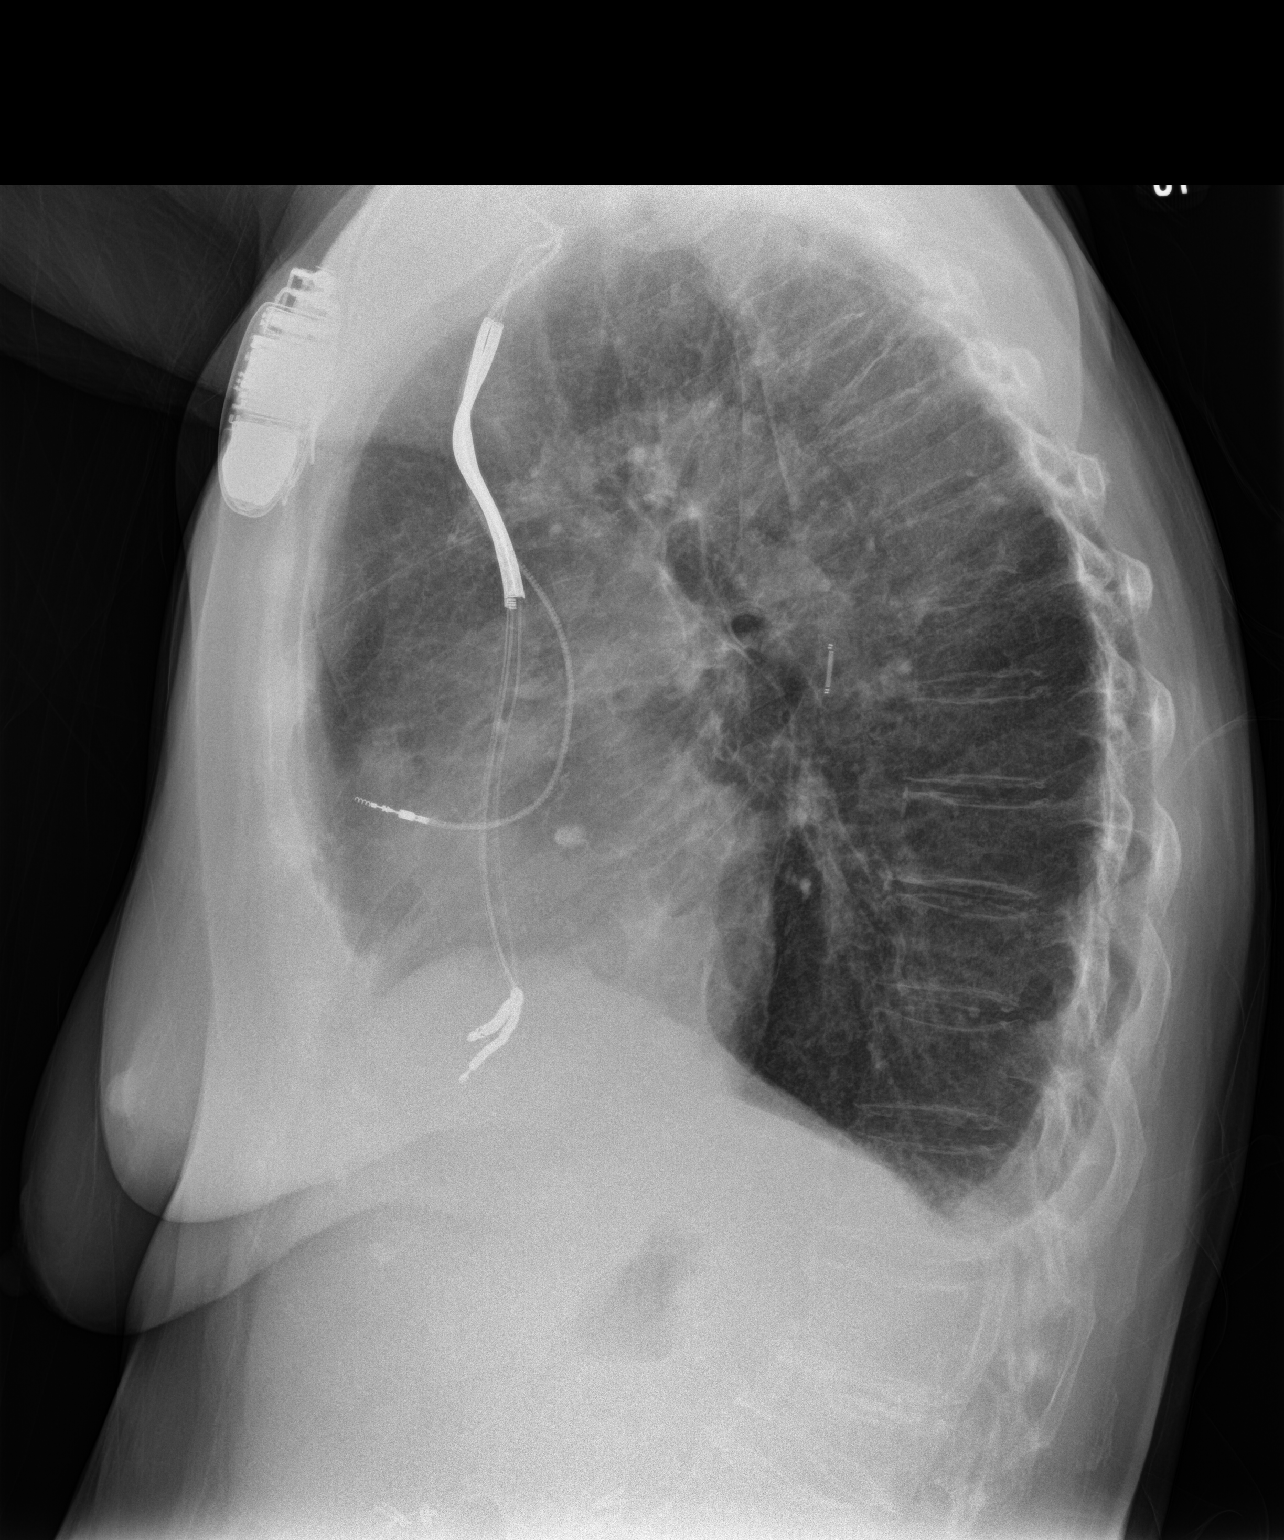

[2 of 2 positions shown; findings below may reference images not displayed]

FINDINGS: Chronic blunting of the costophrenic angles. Mild chronic
interstitial prominence with pulmonary vascular congestion. Stable
cardiomediastinal contours. Left chest wall ICD. Probable
enchondroma of the proximal left humerus. Chronic compression
deformity near the thoracolumbar junction.
IMPRESSION: Pulmonary vascular congestion. Small chronic pleural effusions or
pleural thickening.

## 2021-03-05 NOTE — Telephone Encounter (Signed)
Seen in message you had talked to her today wanted to make sure you were aware of this call as well.   [place] Day - Client TELEPHONE ADVICE RECORD [facility] Patient Name: [name] Gender: Female DOB: [birth year] Age: 87 Y 11 M 26 D Return Phone Number: [phone] (Primary), [phone] (Secondary) Address: [address] City/ State/ Zip: [place]  [address] Client [facility] - Client Client Site [place] - Day Physician Waunita Schooner- MD Contact Type Call Who Is Calling Patient / Member / Family / Caregiver Call Type Triage / Clinical Relationship To Patient Self Return Phone Number [phone] (Primary) Chief Complaint Foot Pain Reason for Call Symptomatic / Request for Health Information Initial Comment Having foot pain, hard to walk on it. Recently had a stints put in her legs and had surgery on her foot. Also has gout in the same foot. Is anemic, feels weak Translation No Nurse Assessment Nurse: Clovis Riley, RN, Georgina Peer Date/Time (Eastern Time): 03/05/2021 11:03:31 AM Confirm and document reason for call. If symptomatic, describe symptoms. ---States she is Having left foot pain and also has gout in that foot, hard to walk on it. She Recently had a stents put in her legs around the first week of october and had surgery on her left foot for cancer 1 week ago. She is anemic, feels weak. Does the patient have any new or worsening symptoms? ---Yes Will a triage be completed? ---Yes Related visit to physician within the last 2 weeks? ---Yes Does the PT have any chronic conditions? (i.e. diabetes, asthma, this includes High risk factors for pregnancy, etc.) ---Yes List chronic conditions. ---anemia, stents, chf, afib, one kidney, ulcers Is this a behavioral health or substance abuse call? ---No Guidelines Guideline Title Affirmed Question Affirmed Notes Nurse Date/Time  Eilene Ghazi Time) Foot Pain [1] Swollen foot AND [2] no fever (Exceptions: localized bump from bunions, calluses, insect bite, sting) [place], RN, Georgina Peer 03/05/2021 11:07:12 AM PLEASE NOTE: All timestamps contained within this report are represented as [place] Standard Time. CONFIDENTIALTY NOTICE: This fax transmission is intended only for the addressee. It contains information that is legally privileged, confidential or otherwise protected from use or disclosure. If you are not the intended recipient, you are strictly prohibited from reviewing, disclosing, copying using or disseminating any of this information or taking any action in reliance on or regarding this information. If you have received this fax in error, please notify us immediately by telephone so that we can arrange for its return to [place]. Phone: [phone], Toll-Free: [phone], Fax: [phone] Page: 2 of 2 Call Id: [id number] [place]. Time Eilene Ghazi Time) Disposition Final User 03/05/2021 11:09:55 AM See PCP within 24 Hours Yes Clovis Riley, RN, Leilani Merl Disagree/Comply Comply Caller Understands Yes PreDisposition Call Doctor Care Advice Given Per Guideline SEE PCP WITHIN 24 HOURS: * IF OFFICE WILL BE OPEN: You need to be examined within the next 24 hours. Call your doctor (or NP/PA) when the office opens and make an appointment. PAIN MEDICINES: * For pain relief, you can take either acetaminophen, ibuprofen, or naproxen. * They are over-the-counter (OTC) pain drugs. You can buy them at the drugstore. * ACETAMINOPHEN - REGULAR STRENGTH TYLENOL: Take 650 mg (two 325 mg pills) by mouth every 4 to 6 hours as needed. Each Regular Strength Tylenol pill has 325 mg of acetaminophen. The most you should take is 10 pills a day (3,250 mg total). Note: In [place], the maximum is 12  pills a day (3,900 mg total). CALL BACK IF: * Fever occurs * You become worse CARE ADVICE given per Foot Pain (Adult) guideline. Comments User: Arman Bogus, RN Date/Time Eilene Ghazi Time): 03/05/2021 11:15:26 AM pt would like to have blood work done and would like to have someone call her. Referrals REFERRED TO PCP OFFICE

## 2021-03-05 NOTE — Telephone Encounter (Signed)
Spoke to patient. She is willing to come for in-person visit for hospital follow-up.   Please schedule for Wednesday or Thursday of this week.

## 2021-03-05 NOTE — Telephone Encounter (Signed)
Spoke to [place] with [facility] and she is seeing pt today at 1:30. This will be her first visit with the pt. She states that she can let us know how pt looks and can connect with [place] virtually if we'd like to do that.

## 2021-03-08 ENCOUNTER — Other Ambulatory Visit: Payer: Self-pay

## 2021-03-08 ENCOUNTER — Ambulatory Visit (INDEPENDENT_AMBULATORY_CARE_PROVIDER_SITE_OTHER): Payer: PPO

## 2021-03-08 ENCOUNTER — Encounter: Payer: Self-pay | Admitting: Family Medicine

## 2021-03-08 ENCOUNTER — Ambulatory Visit (INDEPENDENT_AMBULATORY_CARE_PROVIDER_SITE_OTHER): Payer: PPO | Admitting: Family Medicine

## 2021-03-08 ENCOUNTER — Other Ambulatory Visit: Payer: Self-pay | Admitting: Family Medicine

## 2021-03-08 VITALS — BP 128/78 | HR 68 | Temp 97.0°F | Ht 66.75 in | Wt 140.4 lb

## 2021-03-08 DIAGNOSIS — M10372 Gout due to renal impairment, left ankle and foot: Secondary | ICD-10-CM

## 2021-03-08 DIAGNOSIS — J449 Chronic obstructive pulmonary disease, unspecified: Secondary | ICD-10-CM | POA: Diagnosis not present

## 2021-03-08 DIAGNOSIS — I739 Peripheral vascular disease, unspecified: Secondary | ICD-10-CM

## 2021-03-08 DIAGNOSIS — N1832 Chronic kidney disease, stage 3b: Secondary | ICD-10-CM | POA: Diagnosis not present

## 2021-03-08 DIAGNOSIS — R0981 Nasal congestion: Secondary | ICD-10-CM | POA: Diagnosis not present

## 2021-03-08 DIAGNOSIS — I509 Heart failure, unspecified: Secondary | ICD-10-CM | POA: Diagnosis not present

## 2021-03-08 DIAGNOSIS — J439 Emphysema, unspecified: Secondary | ICD-10-CM

## 2021-03-08 DIAGNOSIS — I5032 Chronic diastolic (congestive) heart failure: Secondary | ICD-10-CM | POA: Diagnosis not present

## 2021-03-08 DIAGNOSIS — D508 Other iron deficiency anemias: Secondary | ICD-10-CM | POA: Diagnosis not present

## 2021-03-08 DIAGNOSIS — N184 Chronic kidney disease, stage 4 (severe): Secondary | ICD-10-CM

## 2021-03-08 DIAGNOSIS — E039 Hypothyroidism, unspecified: Secondary | ICD-10-CM | POA: Diagnosis not present

## 2021-03-08 DIAGNOSIS — I4821 Permanent atrial fibrillation: Secondary | ICD-10-CM | POA: Diagnosis not present

## 2021-03-08 DIAGNOSIS — H6992 Unspecified Eustachian tube disorder, left ear: Secondary | ICD-10-CM

## 2021-03-08 DIAGNOSIS — C44729 Squamous cell carcinoma of skin of left lower limb, including hip: Secondary | ICD-10-CM | POA: Diagnosis not present

## 2021-03-08 LAB — COMPREHENSIVE METABOLIC PANEL
ALT: 7 U/L (ref 0–35)
AST: 13 U/L (ref 0–37)
Albumin: 4.5 g/dL (ref 3.5–5.2)
Alkaline Phosphatase: 52 U/L (ref 39–117)
BUN: 33 mg/dL — ABNORMAL HIGH (ref 6–23)
CO2: 33 mEq/L — ABNORMAL HIGH (ref 19–32)
Calcium: 9.5 mg/dL (ref 8.4–10.5)
Chloride: 96 mEq/L (ref 96–112)
Creatinine, Ser: 1.69 mg/dL — ABNORMAL HIGH (ref 0.40–1.20)
GFR: 27.47 mL/min — ABNORMAL LOW (ref >60.00)
Glucose, Bld: 89 mg/dL (ref 70–99)
Potassium: 4.5 mEq/L (ref 3.5–5.1)
Sodium: 137 mEq/L (ref 135–145)
Total Bilirubin: 0.7 mg/dL (ref 0.2–1.2)
Total Protein: 6.8 g/dL (ref 6.0–8.3)

## 2021-03-08 LAB — CBC
HCT: 33.5 % — ABNORMAL LOW (ref 36.0–46.0)
Hemoglobin: 10.5 g/dL — ABNORMAL LOW (ref 12.0–15.0)
MCHC: 31.3 g/dL (ref 30.0–36.0)
MCV: 84.2 fl (ref 78.0–100.0)
Platelets: 230 10*3/uL (ref 150.0–400.0)
RBC: 3.98 Mil/uL (ref 3.87–5.11)
RDW: 16.7 % — ABNORMAL HIGH (ref 11.5–15.5)
WBC: 4.2 10*3/uL (ref 4.0–10.5)

## 2021-03-08 LAB — TSH: TSH: 1.33 u[IU]/mL (ref 0.35–5.50)

## 2021-03-08 MED ORDER — ALLOPURINOL 100 MG PO TABS: 50.0000 mg | ORAL_TABLET | Freq: Every day | ORAL | 1 refills | Status: DC

## 2021-03-08 MED ORDER — COLCHICINE 0.6 MG PO TABS: 0.6000 mg | ORAL_TABLET | Freq: Every day | ORAL | 1 refills | Status: DC

## 2021-03-08 MED ORDER — FLUTICASONE PROPIONATE 50 MCG/ACT NA SUSP: 2.0000 | Freq: Every day | NASAL | 6 refills | Status: DC | PRN

## 2021-03-08 MED ORDER — PREGABALIN 25 MG PO CAPS: 25.0000 mg | ORAL_CAPSULE | Freq: Two times a day (BID) | ORAL | 0 refills | Status: DC

## 2021-03-08 NOTE — Assessment & Plan Note (Signed)
Breathing is stable. Reviewed hospital stay and concern for exacerbation but pt declined prednisone. Not needing O2 and pulse ox 100% on RA today. Some wheezes on exam. Repeat CXR per hospital f/u to assess lungs.

## 2021-03-08 NOTE — Assessment & Plan Note (Signed)
Lab Results  Component Value Date   TSH 2.20 02/09/2020   Well controlled on levothyroxine 25 mcg. Will recheck tsh today

## 2021-03-08 NOTE — Patient Instructions (Addendum)
#   Gout - Start Allopurinol 50 mg daily (will decrease uric acid)  - Start Colchicine 0.6 mg daily (this will hopefully help with pain)   Pain - if pain not improved after 1 week - start Lyrica 25 mg at night if tolerating can take 2 times daily - return in 4 weeks   Work on a supplement - Ensure/boost - protein

## 2021-03-08 NOTE — Assessment & Plan Note (Signed)
C/b recent cellulitis and sepsis. She has completed abx. Is following [facility] for weekly wound checks and reports healing - leg wrapped today so unable to assess. Appreciate dermatology support.

## 2021-03-08 NOTE — Assessment & Plan Note (Addendum)
Review hospital stay with symptomatic anemia. EGD with treatment of several non-bleeding lesions. Cont pantoprazole 40 mg bid. Repeat cbc today

## 2021-03-08 NOTE — Assessment & Plan Note (Signed)
Will need to monitor allupurinol/colchicine closely. Low threshold for nephrology involvement. Repeat labs today. Doing well with hydration per patient

## 2021-03-08 NOTE — Progress Notes (Signed)
Subjective:     [name] is a 87 y.o. female presenting for Hospitalization Follow-up     HPI  #Poor healing skin cancer - [facility] - had a una boot to help with healing - was tight and was having more pain - has been told the skin cancer is healing - the dermatologist thinks that the pain is related to the gout  #gout - would come and go before the biopsy - has been taking tylenol every 5 hours -   Has been feeling week - but strength is coming back Does feel nausea occasional Did feel dizzy x 1 last week Drink water - bottle water 4-5 a day Not eating well - for a week only a few bites but is eating better At first was very sick when she went home but getting around more  Underlying nausea since the hospital   COPD/CHF - wearing compression socks - breathing well - has not needed home oxygen   Review of Systems   Social History   Tobacco Use  Smoking Status Former   Packs/day: 0.50   Years: 40.00   Pack years: 20.00   Types: Cigarettes   Quit date: 05/06/2001   Years since quitting: 19.8  Smokeless Tobacco Never        Objective:    BP Readings from Last 3 Encounters:  03/08/21 128/78  02/15/21 (!) 145/68  01/30/21 (!) 148/62   Wt Readings from Last 3 Encounters:  03/08/21 140 lb 6 oz (63.7 kg)  02/14/21 169 lb 1.5 oz (76.7 kg)  01/30/21 152 lb 6 oz (69.1 kg)    BP 128/78   Pulse 68   Temp (!) 97 F (36.1 C) (Temporal)   Ht 5' 6.75" (1.695 m)   Wt 140 lb 6 oz (63.7 kg)   SpO2 100%   BMI 22.15 kg/m    Physical Exam Constitutional:      General: She is not in acute distress.    Appearance: She is well-developed. She is not diaphoretic.  HENT:     Right Ear: External ear normal.     Left Ear: External ear normal.  Eyes:     Conjunctiva/sclera: Conjunctivae normal.  Cardiovascular:     Rate and Rhythm: Normal rate and regular rhythm.     Heart sounds: Murmur heard.  Pulmonary:      Effort: Pulmonary effort is normal.     Breath sounds: Wheezing (Right UL) present. No rhonchi or rales.  Musculoskeletal:     Cervical back: Neck supple.     Comments: Left foot - ttp and warm on the great toe. Remainder is wrapped in bandage with several cuts to relieve tight wrapping  Skin:    General: Skin is warm and dry.     Capillary Refill: Capillary refill takes less than 2 seconds.  Neurological:     Mental Status: She is alert. Mental status is at baseline.  Psychiatric:        Mood and Affect: Mood normal.        Behavior: Behavior normal.          Assessment & Plan:   Problem List Items Addressed This Visit       Cardiovascular and Mediastinum   Permanent atrial fibrillation    Rate controlled. GI bleed stable. Cont elliquis      Chronic diastolic CHF (congestive heart failure) (HCC)    Appears euvolemic. Cont torsemide and potassium. Labs  today. Breathing is improved.       Relevant Orders   DG Chest 2 View     Respiratory   COPD (chronic obstructive pulmonary disease) (HCC)    Breathing is stable. Reviewed hospital stay and concern for exacerbation but pt declined prednisone. Not needing O2 and pulse ox 100% on RA today. Some wheezes on exam. Repeat CXR per hospital f/u to assess lungs.       Relevant Medications   fluticasone (FLONASE) 50 MCG/ACT nasal spray   Other Relevant Orders   DG Chest 2 View     Endocrine   Hypothyroidism    Lab Results  Component Value Date   TSH 2.20 02/09/2020  Well controlled on levothyroxine 25 mcg. Will recheck tsh today      Relevant Orders   TSH     Musculoskeletal and Integument   Acute gout due to renal impairment involving left foot - Primary    Pain persisting. Will initiate allupurinol 50 mg (renal dosing) with repeat uric acid in 4 weeks. Return OV in 4 weeks. Will also co-treat with colchicine 0.6 mg daily (renal) to try and reduce pain. Complicated case with ongoing skin cancer treatment and cellulitis  and recent vascular stents placed. Will also refer to rheumatology so their support is available if pain is not resolving with treatment. She is intolerant of opiates and it seems lyrica was tried in the hospital but she did not start at discharge. She will start lyrica in 1 week if no improvement on colchicine. Plan will be to continue colchicine for ~1 month and try decreasing to every other day if pain resolves. Allupurinol will be increased to goal Uric Acid <6      Relevant Medications   colchicine 0.6 MG tablet   allopurinol (ZYLOPRIM) 100 MG tablet   pregabalin (LYRICA) 25 MG capsule   Other Relevant Orders   Uric Acid   Squamous cell carcinoma of foot, left    C/b recent cellulitis and sepsis. She has completed abx. Is following [facility] for weekly wound checks and reports healing - leg wrapped today so unable to assess. Appreciate dermatology support.       Relevant Medications   colchicine 0.6 MG tablet   allopurinol (ZYLOPRIM) 100 MG tablet     Genitourinary   Chronic kidney disease, stage 3b (HCC)    Will need to monitor allupurinol/colchicine closely. Low threshold for nephrology involvement. Repeat labs today. Doing well with hydration per patient      Relevant Orders   Comprehensive metabolic panel     Other   Claudication [facility])    S/p stent placement and pt notes significant improvement in leg cramping. Appreciate vascular support.       Relevant Medications   pregabalin (LYRICA) 25 MG capsule   Iron deficiency anemia    Review hospital stay with symptomatic anemia. EGD with treatment of several non-bleeding lesions. Cont pantoprazole 40 mg bid. Repeat cbc today      Relevant Orders   CBC   Other Visit Diagnoses     Nasal congestion       Relevant Medications   fluticasone (FLONASE) 50 MCG/ACT nasal spray   Eustachian tube disorder, left       Relevant Medications   fluticasone (FLONASE) 50 MCG/ACT nasal spray        Return in about 4 weeks  (around 04/05/2021) for Tabitha.  Lesleigh Noe, MD  This visit occurred during the SARS-CoV-2 public health emergency.  Safety protocols were in place, including screening questions prior to the visit, additional usage of staff PPE, and extensive cleaning of exam room while observing appropriate contact time as indicated for disinfecting solutions.

## 2021-03-08 NOTE — Assessment & Plan Note (Signed)
Rate controlled. GI bleed stable. Cont elliquis

## 2021-03-08 NOTE — Assessment & Plan Note (Signed)
S/p stent placement and pt notes significant improvement in leg cramping. Appreciate vascular support.

## 2021-03-08 NOTE — Assessment & Plan Note (Signed)
Pain persisting. Will initiate allupurinol 50 mg (renal dosing) with repeat uric acid in 4 weeks. Return OV in 4 weeks. Will also co-treat with colchicine 0.6 mg daily (renal) to try and reduce pain. Complicated case with ongoing skin cancer treatment and cellulitis and recent vascular stents placed. Will also refer to rheumatology so their support is available if pain is not resolving with treatment. She is intolerant of opiates and it seems lyrica was tried in the hospital but she did not start at discharge. She will start lyrica in 1 week if no improvement on colchicine. Plan will be to continue colchicine for ~1 month and try decreasing to every other day if pain resolves. Allupurinol will be increased to goal Uric Acid <6

## 2021-03-08 NOTE — Assessment & Plan Note (Signed)
Appears euvolemic. Cont torsemide and potassium. Labs today. Breathing is improved.

## 2021-03-09 ENCOUNTER — Other Ambulatory Visit: Payer: Self-pay

## 2021-03-09 DIAGNOSIS — M79662 Pain in left lower leg: Secondary | ICD-10-CM

## 2021-03-09 DIAGNOSIS — S0120XA Unspecified open wound of nose, initial encounter: Secondary | ICD-10-CM | POA: Diagnosis not present

## 2021-03-15 DIAGNOSIS — S91302A Unspecified open wound, left foot, initial encounter: Secondary | ICD-10-CM | POA: Diagnosis not present

## 2021-03-19 DIAGNOSIS — R2689 Other abnormalities of gait and mobility: Secondary | ICD-10-CM | POA: Diagnosis not present

## 2021-03-19 DIAGNOSIS — Z4789 Encounter for other orthopedic aftercare: Secondary | ICD-10-CM | POA: Diagnosis not present

## 2021-03-19 DIAGNOSIS — M6281 Muscle weakness (generalized): Secondary | ICD-10-CM | POA: Diagnosis not present

## 2021-03-19 DIAGNOSIS — J449 Chronic obstructive pulmonary disease, unspecified: Secondary | ICD-10-CM | POA: Diagnosis not present

## 2021-03-19 DIAGNOSIS — J90 Pleural effusion, not elsewhere classified: Secondary | ICD-10-CM | POA: Diagnosis not present

## 2021-03-19 DIAGNOSIS — I5033 Acute on chronic diastolic (congestive) heart failure: Secondary | ICD-10-CM | POA: Diagnosis not present

## 2021-03-20 ENCOUNTER — Other Ambulatory Visit: Payer: Self-pay

## 2021-03-20 DIAGNOSIS — M79662 Pain in left lower leg: Secondary | ICD-10-CM

## 2021-03-21 ENCOUNTER — Ambulatory Visit (HOSPITAL_COMMUNITY)
Admission: RE | Admit: 2021-03-21 | Discharge: 2021-03-21 | Disposition: A | Discharge status: Home / Self Care | Payer: PPO | Source: Ambulatory Visit | Attending: Vascular Surgery | Admitting: Vascular Surgery

## 2021-03-21 ENCOUNTER — Ambulatory Visit (INDEPENDENT_AMBULATORY_CARE_PROVIDER_SITE_OTHER)
Admission: RE | Admit: 2021-03-21 | Discharge: 2021-03-21 | Disposition: A | Discharge status: Home / Self Care | Payer: PPO | Source: Ambulatory Visit | Attending: Vascular Surgery | Admitting: Vascular Surgery

## 2021-03-21 ENCOUNTER — Other Ambulatory Visit: Payer: Self-pay

## 2021-03-21 ENCOUNTER — Inpatient Hospital Stay (HOSPITAL_COMMUNITY): Admit: 2021-03-21 | Payer: PPO

## 2021-03-21 ENCOUNTER — Ambulatory Visit (INDEPENDENT_AMBULATORY_CARE_PROVIDER_SITE_OTHER): Payer: PPO | Admitting: Physician Assistant

## 2021-03-21 VITALS — BP 165/66 | HR 65 | Temp 97.2°F | Resp 16 | Ht 66.0 in | Wt 141.9 lb

## 2021-03-21 DIAGNOSIS — T8189XD Other complications of procedures, not elsewhere classified, subsequent encounter: Secondary | ICD-10-CM | POA: Diagnosis not present

## 2021-03-21 DIAGNOSIS — M7989 Other specified soft tissue disorders: Secondary | ICD-10-CM

## 2021-03-21 DIAGNOSIS — M79662 Pain in left lower leg: Secondary | ICD-10-CM

## 2021-03-21 DIAGNOSIS — I739 Peripheral vascular disease, unspecified: Secondary | ICD-10-CM | POA: Diagnosis not present

## 2021-03-21 NOTE — Progress Notes (Signed)
Office Note     CC:  follow up Requesting Provider:  Lesleigh Noe, MD  HPI: [name] is a 87 y.o. (December 17, 1933) female who presents for follow up of peripheral artery disease. She was seen in consultation at the hospital on 02/08/21 by Dr. Stanford Breed with non healing wound of her left lower extremity following excisional biopsy for squamous cell carcinoma. She also was having rest pain. She reported long history of intermittent claudication. Known history of PAD. Angiography was recommended. On 02/12/21 she underwent CO2 Aortogram, LLE arteriogram with bilateral common iliac artery stenting (8 x 29 mm VBX) by Dr Donzetta Matters. She did well post intervention. On discharge she was advised to continue her Aspirin, Eliquis and Crestor.  She presents today with her niece for follow up with non invasive studies. She says that she is no longer having rest pain or claudication in her legs. She is able to ambulate around without any discomfort in her legs. Her biopsy site is healing well. She did have a unna boot on it but this was discontinued about 2 weeks ago. Her niece who is present with her says that it has improved more since discontinuation of the unna boot. She reports that she does not have to follow up after her Mohs procedure until December.   The pt is on a statin for cholesterol management. Also takes Zetia The pt is not on a daily aspirin.   Other AC:  Eliquis The pt is on ARB for hypertension.   The pt is not diabetic.   Tobacco hx:  Former, quit 2003  Past Medical History:  Diagnosis Date   Actinic keratosis 01/17/2015   R forearm   Adjustment disorder with anxiety    Anemia    Anginal pain ([place])    Arthritis    "some in my hands" (11/11/2012)   Atrial fibrillation ([place])    Atypical mole 03/25/2018   L forearm - severe   Automatic implantable cardioverter-defibrillator in situ    Blood in stool    Carotid artery stenosis 09/2007   a. 09/2007: 60-79% bilateral (stable); b. 10/2008:  40-59% R 60-79%    Chest pain, unspecified    Chronic airway obstruction, not elsewhere classified    Chronic bronchitis ([place])    "get it some; not q year" (11/11/2012)   Chronic diastolic CHF (congestive heart failure) ([place]) 06/04/2013   Chronic kidney disease, unspecified    Coronary artery disease    non-obstructive by 2006 cath   Displaced fracture of left femoral neck ([place]) 10/09/2018   Frequent UTI    "get them a couple times/yr" (11/11/2012)   GI bleed 03/28/2020   Hemorrhage of rectum and anus    High cholesterol    History of blood transfusion 04/2011   "after hip OR" (11/11/2012)   Hx of renal cell cancer    Hypertension 05/20/2011   Hypertr obst cardiomyop    Hypotension, unspecified    cardiac cath 2006..nonobstructive CAD 30-40s lesions.Marland KitchenETT 1/09 nondiagnostic due to poor HR response..Right Renal Cancer 2003   Long term (current) use of anticoagulants    Malignant neoplasm of kidney, except pelvis    Osteoarthritis of right hip    Other and unspecified coagulation defects    PONV (postoperative nausea and vomiting)    Presence of permanent cardiac pacemaker    RECTAL BLEEDING 10/13/2009   Qualifier: Diagnosis of  By: Chester Holstein NP, Paula     Renal cancer [facility]) 06/2001   Right   Secondary  cardiomyopathy, unspecified    Sinus bradycardia    Squamous cell carcinoma of skin 01/17/2015   R lat wrist   Squamous cell carcinoma of skin 01/29/2018   R post upper leg - superficially invasive   Squamous cell carcinoma of skin 03/25/2018   L lat foot   Squamous cell carcinoma of skin 11/09/2020   left lat foot - EDC 01/01/21   Urge incontinence     Past Surgical History:  Procedure Laterality Date   ABDOMINAL AORTOGRAM W/LOWER EXTREMITY N/A 02/12/2021   Procedure: ABDOMINAL AORTOGRAM W/LOWER EXTREMITY;  Surgeon: Waynetta Sandy, MD;  Location: [place] [facility];  Service: Cardiovascular;  Laterality: N/A;   ABDOMINAL HYSTERECTOMY  1975   for benign causes    APPENDECTOMY     BI-VENTRICULAR PACEMAKER UPGRADE  05/04/2010   BIOPSY  02/28/2019   Procedure: BIOPSY;  Surgeon: Milus Banister, MD;  Location: [facility] ENDOSCOPY;  Service: Endoscopy;;   CARDIAC CATHETERIZATION  2006   CARDIOVERSION N/A 02/20/2018   Procedure: CARDIOVERSION;  Surgeon: Minna Merritts, MD;  Location: [place] [facility];  Service: Cardiovascular;  Laterality: N/A;   CARDIOVERSION N/A 03/27/2018   Procedure: CARDIOVERSION;  Surgeon: Minna Merritts, MD;  Location: [facility];  Service: Cardiovascular;  Laterality: N/A;   CATARACT EXTRACTION W/ INTRAOCULAR LENS  IMPLANT, BILATERAL  01/2006-02-2006   CHOLECYSTECTOMY N/A 11/11/2012   Procedure: LAPAROSCOPIC CHOLECYSTECTOMY WITH INTRAOPERATIVE CHOLANGIOGRAM;  Surgeon: Imogene Burn. Georgette Dover, MD;  Location: [employer];  Service: General;  Laterality: N/A;   COLONOSCOPY WITH PROPOFOL N/A 02/28/2019   Procedure: COLONOSCOPY WITH PROPOFOL;  Surgeon: Milus Banister, MD;  Location: [facility] ENDOSCOPY;  Service: Endoscopy;  Laterality: N/A;   ENTEROSCOPY N/A 03/30/2020   Procedure: ENTEROSCOPY;  Surgeon: Lavena Bullion, DO;  Location: [place];  Service: Gastroenterology;  Laterality: N/A;   ENTEROSCOPY N/A 02/10/2021   Procedure: ENTEROSCOPY;  Surgeon: Carol Ada, MD;  Location: [place];  Service: Endoscopy;  Laterality: N/A;   EP IMPLANTABLE DEVICE N/A 02/21/2016   Procedure: ICD Generator Changeout;  Surgeon: Deboraha Sprang, MD;  Location: [place] [facility];  Service: Cardiovascular;  Laterality: N/A;   ESOPHAGOGASTRODUODENOSCOPY (EGD) WITH PROPOFOL N/A 02/28/2019   Procedure: ESOPHAGOGASTRODUODENOSCOPY (EGD) WITH PROPOFOL;  Surgeon: Milus Banister, MD;  Location: [facility] ENDOSCOPY;  Service: Endoscopy;  Laterality: N/A;   GIVENS CAPSULE STUDY N/A 03/15/2019   Procedure: GIVENS CAPSULE STUDY;  Surgeon: Thornton Park, MD;  Location: [place];  Service: Gastroenterology;  Laterality: N/A;   HOT HEMOSTASIS N/A 03/30/2020   Procedure: HOT  HEMOSTASIS (ARGON PLASMA COAGULATION/BICAP);  Surgeon: Lavena Bullion, DO;  Location: [facility] ENDOSCOPY;  Service: Gastroenterology;  Laterality: N/A;   HOT HEMOSTASIS N/A 02/10/2021   Procedure: HOT HEMOSTASIS (ARGON PLASMA COAGULATION/BICAP);  Surgeon: Carol Ada, MD;  Location: [place];  Service: Endoscopy;  Laterality: N/A;   INSERT / REPLACE / REMOVE PACEMAKER  05-01-11   02-28-05-/05-04-10-ICD-MEDTRONIC MAXIMAL DR   JOINT REPLACEMENT     LAPAROSCOPIC CHOLECYSTECTOMY  11/11/2012   LAPAROSCOPIC LYSIS OF ADHESIONS N/A 11/11/2012   Procedure: LAPAROSCOPIC LYSIS OF ADHESIONS;  Surgeon: Imogene Burn. Georgette Dover, MD;  Location: [place];  Service: General;  Laterality: N/A;   NEPHRECTOMY Right 06/2001    S/P RENAL CELL CANCER   PERIPHERAL VASCULAR INTERVENTION Bilateral 02/12/2021   Procedure: PERIPHERAL VASCULAR INTERVENTION;  Surgeon: Waynetta Sandy, MD;  Location: [place] [facility];  Service: Cardiovascular;  Laterality: Bilateral;  Iliac artery stents   PRESSURE SENSOR/CARDIOMEMS N/A 02/03/2019   Procedure:  PRESSURE SENSOR/CARDIOMEMS;  Surgeon: Larey Dresser, MD;  Location: [place] [facility];  Service: Cardiovascular;  Laterality: N/A;   RIGHT HEART CATH N/A 11/09/2018   Procedure: RIGHT HEART CATH;  Surgeon: Larey Dresser, MD;  Location: [place] [facility];  Service: Cardiovascular;  Laterality: N/A;   RIGHT HEART CATH N/A 03/08/2019   Procedure: RIGHT HEART CATH;  Surgeon: Larey Dresser, MD;  Location: [place] [facility];  Service: Cardiovascular;  Laterality: N/A;   SUBMUCOSAL TATTOO INJECTION  02/28/2019   Procedure: SUBMUCOSAL TATTOO INJECTION;  Surgeon: Milus Banister, MD;  Location: [facility] ENDOSCOPY;  Service: Endoscopy;;   TOTAL HIP ARTHROPLASTY Right 05/03/2011   Procedure: TOTAL HIP ARTHROPLASTY ANTERIOR APPROACH;  Surgeon: Mcarthur Rossetti;  Location: [facility];  Service: Orthopedics;  Laterality: Right;  Removal of Cannulated Screws Right Hip, Right Direct Anterior Hip  Replacement   TOTAL HIP ARTHROPLASTY Left 10/09/2018   Procedure: TOTAL HIP ARTHROPLASTY ANTERIOR APPROACH;  Surgeon: Rod Can, MD;  Location: [place];  Service: Orthopedics;  Laterality: Left;    Social History   Socioeconomic History   Marital status: Widowed    Spouse name: Not on file   Number of children: 2   Years of education: high school   Highest education level: Not on file  Occupational History   Occupation: Retired    Fish farm manager: RETIRED  Tobacco Use   Smoking status: Former    Packs/day: 0.50    Years: 40.00    Pack years: 20.00    Types: Cigarettes    Quit date: 05/06/2001    Years since quitting: 19.8   Smokeless tobacco: Never  Vaping Use   Vaping Use: Never used  Substance and Sexual Activity   Alcohol use: No    Alcohol/week: 0.0 standard drinks   Drug use: No   Sexual activity: Not Currently  Other Topics Concern   Not on file  Social History Narrative   Would desire CPR   07/11/20   From: the area   Living: with great-grandson - [name] (2003)   Work: retired - clerical      Family: 2 children - [name] and Dozier - 2 grand chlidren - 5 great grand children      Enjoys: spending time with friends - watch move, eat out, spend time      Exercise: walking around the house   Diet: not good, limits red meat, limits salt, low appetite      Safety   Seat belts: Yes    Guns: Yes  and secure   Safe in relationships: Yes    Social Determinants of Health   Financial Resource Strain: Not on file  Food Insecurity: Not on file  Transportation Needs: Not on file  Physical Activity: Not on file  Stress: Not on file  Social Connections: Not on file  Intimate Partner Violence: Not on file    Family History  Problem Relation Age of Onset   Heart failure Mother    Early death Father        car accident   Breast cancer Maternal Aunt 82   Breast cancer Cousin    Breast cancer Other    Colon cancer Neg Hx     Current Outpatient Medications   Medication Sig Dispense Refill   acetaminophen (TYLENOL) 500 MG tablet Take 500-1,000 mg by mouth every 8 (eight) hours as needed for mild pain or headache.      allopurinol (ZYLOPRIM) 100 MG tablet Take 0.5  tablets (50 mg total) by mouth daily. 30 tablet 1   apixaban (ELIQUIS) 2.5 MG TABS tablet Take 1 tablet (2.5 mg total) by mouth 2 (two) times daily. Dispense starter pack (Patient taking differently: Take 2.5 mg by mouth 2 (two) times daily.) 60 tablet 11   Cholecalciferol (VITAMIN D) 50 MCG (2000 UT) CAPS Take 4,000 Units by mouth daily.     colchicine 0.6 MG tablet Take 1 tablet (0.6 mg total) by mouth daily. 30 tablet 1   Cranberry (AZO CRANBERRY GUMMIES) 500 MG CHEW Chew 2,000 mg by mouth daily.     Cyanocobalamin (B-12 PO) Take 1 tablet by mouth daily.      ezetimibe (ZETIA) 10 MG tablet Take 1 tablet (10 mg total) by mouth daily. 30 tablet 11   ferrous sulfate 325 (65 FE) MG tablet Take 1 tablet (325 mg total) by mouth 2 (two) times daily with a meal. 60 tablet 0   fluticasone (FLONASE) 50 MCG/ACT nasal spray Place 2 sprays into both nostrils daily as needed for allergies or rhinitis. 16 g 6   levothyroxine (SYNTHROID) 25 MCG tablet TAKE 1 TABLET BY MOUTH ONCE DAILY BEFOREBREAKFAST (Patient taking differently: Take 25 mcg by mouth daily before breakfast.) 30 tablet 3   magnesium oxide (MAG-OX) 400 MG tablet Take 1 tablet (400 mg total) by mouth daily. 30 tablet 2   metolazone (ZAROXOLYN) 2.5 MG tablet Take 2.5 mg by mouth daily as needed (For fluid).     ondansetron (ZOFRAN) 4 MG tablet Take 1 tablet (4 mg total) by mouth every 8 (eight) hours as needed for nausea or vomiting. 20 tablet 1   pantoprazole (PROTONIX) 40 MG tablet Take 1 tablet (40 mg total) by mouth 2 (two) times daily before a meal. 60 tablet 0   potassium chloride SA (KLOR-CON M20) 20 MEQ tablet Take 2 tablets (40 mEq total) by mouth 2 (two) times daily. Take 1 extra tablet on the days you take metolazone. 360 tablet 3    pregabalin (LYRICA) 25 MG capsule Take 1 capsule (25 mg total) by mouth 2 (two) times daily. 60 capsule 0   rosuvastatin (CRESTOR) 40 MG tablet Take 1 tablet (40 mg total) by mouth daily. 90 tablet 3   torsemide (DEMADEX) 100 MG tablet TAKE 1 TABLET BY MOUTH EVERY MORNING (EVENING DOSE 80 MG) (Patient taking differently: Take 100 mg by mouth daily.) 30 tablet 11   vitamin C (ASCORBIC ACID) 250 MG tablet Take 500 mg by mouth daily.     losartan (COZAAR) 25 MG tablet Take 0.5 tablets (12.5 mg total) by mouth daily. 90 tablet 3   No current facility-administered medications for this visit.    Allergies  Allergen Reactions   Dilaudid [Hydromorphone] Other (See Comments)    Excessive Somnolence- Required Narcan   Fentanyl Other (See Comments)    Required Narcan when given with Dilaudid. Tolerated single dose when given during procedure 02/03/2019.    Codeine Nausea And Vomiting    Hallucinations    Morphine And Related Nausea And Vomiting    Hallucinations    Whole Blood Itching and Nausea And Vomiting    Pt and family stated pt will receive blood products but had a reaction requiring benadryl on a previous administration.    Sulfonamide Derivatives Other (See Comments)    Dry mouth     REVIEW OF SYSTEMS:  [X]  denotes positive finding, [ ]  denotes negative finding Cardiac  Comments:  Chest pain or chest pressure:  Shortness of breath upon exertion:    Short of breath when lying flat:    Irregular heart rhythm:        Vascular    Pain in calf, thigh, or hip brought on by ambulation:    Pain in feet at night that wakes you up from your sleep:     Blood clot in your veins:    Leg swelling:  X       Pulmonary    Oxygen at home:    Productive cough:     Wheezing:         Neurologic    Sudden weakness in arms or legs:     Sudden numbness in arms or legs:     Sudden onset of difficulty speaking or slurred speech:    Temporary loss of vision in one eye:     Problems with  dizziness:         Gastrointestinal    Blood in stool:     Vomited blood:         Genitourinary    Burning when urinating:     Blood in urine:        Psychiatric    Major depression:         Hematologic    Bleeding problems:    Problems with blood clotting too easily:        Skin    Rashes or ulcers:        Constitutional    Fever or chills:      PHYSICAL EXAMINATION:  Vitals:   03/21/21 0947  BP: (!) 165/66  Pulse: 65  Resp: 16  Temp: (!) 97.2 F (36.2 C)  TempSrc: Temporal  SpO2: 95%  Weight: 141 lb 14.4 oz (64.4 kg)  Height: 5\' 6"  (1.676 m)    General:  WDWN in NAD; vital signs documented above Gait: Normal HENT: WNL, normocephalic Pulmonary: normal non-labored breathing , without wheezing Cardiac: regular HR, without  Murmurs without carotid bruit Abdomen: soft, NT, no masses Vascular Exam/Pulses:  Right Left  Radial 2+ (normal) 2+ (normal)  Femoral 2+ (normal) 2+ (normal)  Popliteal Not palpable Not palpable  DP 2+ (normal) 2+ (normal)  PT Not palpable Not palpable  Bilateral lower extremities warm and well perfused Extremities: without ischemic changes, without Gangrene , without cellulitis; with wound of left leg dressed. Bilateral edema present. Numerous reticular and spider veins BLE Musculoskeletal: no muscle wasting or atrophy  Neurologic: A&O X 3;  No focal weakness or paresthesias are detected Psychiatric:  The pt has Normal affect.   Non-Invasive Vascular Imaging:   +-------+-----------+-----------+------------+------------+  ABI/TBIToday's ABIToday's TBIPrevious ABIPrevious TBI  +-------+-----------+-----------+------------+------------+  Right  0.83       0.38       0.67                      +-------+-----------+-----------+------------+------------+  Left   1.07       0.51       0.51                      +-------+-----------+-----------+------------+------------+  Right great toe pressure : 53 mmHg Left great toe  pressure: 78 mmHg  VAS US Aorta/IVC/iliacs: Abdominal Aorta Findings:  +--------+-------+----------+----------+--------+--------+--------+  LocationAP (cm)Trans (cm)PSV (cm/s)WaveformThrombusComments  +--------+-------+----------+----------+--------+--------+--------+  Mid                      66                                  +--------+-------+----------+----------+--------+--------+--------+  Distal                   132                                 +--------+-------+----------+----------+--------+--------+--------+   Right Stent(s):  +---------------+---++---------++  Prox to Stent  132triphasic  +---------------+---++---------++  Proximal Stent 169triphasic  +---------------+---++---------++  Mid Stent      137triphasic  +---------------+---++---------++  Distal Stent   179triphasic  +---------------+---++---------++  Distal to Stent121triphasic  +---------------+---++---------++   Left Stent(s):  +---------------+---++---------++  Prox to Stent  132triphasic  +---------------+---++---------++  Proximal Stent 165triphasic  +---------------+---++---------++  Mid Stent      193triphasic  +---------------+---++---------++  Distal Stent   179triphasic  +---------------+---++---------++  Distal to Stent258triphasic  +---------------+---++---------++    Summary:  Stenosis: Patent common iliac stents bilaterally with no evidence of restenosis.    ASSESSMENT/PLAN:: 87 y.o. female here for follow up of peripheral artery disease. She was seen in consultation at the hospital on 02/08/21 by Dr. Stanford Breed with non healing wound of her left lower extremity following excisional biopsy for squamous cell carcinoma. She had Angiography was recommended. On 02/12/21 she underwent CO2 Aortogram, LLE arteriogram with bilateral common iliac artery stenting (8 x 29 mm VBX). - Bilateral ABI's appear increased  from prior study. Bilateral CIA stents are widely patent with triphasic flow - continue statin and Eliquis - she knows to call for earlier follow up should she have any new or recurrent symptoms - she will return in 6 months with repeat ABI and Aorto/IVC/iliac duplex   Karoline Caldwell, PA-C [facility] [phone]  Clinic MD: [facility]/ Scot Dock

## 2021-03-23 ENCOUNTER — Other Ambulatory Visit: Payer: Self-pay

## 2021-03-23 DIAGNOSIS — I739 Peripheral vascular disease, unspecified: Secondary | ICD-10-CM

## 2021-04-03 ENCOUNTER — Telehealth: Payer: Self-pay | Admitting: Family Medicine

## 2021-04-03 DIAGNOSIS — Z20828 Contact with and (suspected) exposure to other viral communicable diseases: Secondary | ICD-10-CM

## 2021-04-03 MED ORDER — OSELTAMIVIR PHOSPHATE 75 MG PO CAPS: 75.0000 mg | ORAL_CAPSULE | Freq: Every day | ORAL | 0 refills | Status: DC

## 2021-04-03 NOTE — Telephone Encounter (Signed)
Yes, okay to go with 10 capsules.

## 2021-04-03 NOTE — Telephone Encounter (Signed)
Gerald Stabs at [facility] notified as instructed and Gerald Stabs voiced understanding. Changed qty on pts med with same instructions.

## 2021-04-03 NOTE — Telephone Encounter (Signed)
Pt called in stated her grandson has the flu in the same household . Wants to know if she can have something called in for prevention because she has other health issues and dose not want to get the flu . Please  advise  [phone]

## 2021-04-03 NOTE — Telephone Encounter (Signed)
Caryl Pina with [facility] called asking if the quantity can be 10 instead of 7.

## 2021-04-03 NOTE — Telephone Encounter (Signed)
I spoke with Merrily Pew at [facility] and the tamiflu comes prepackaged # 10. Thank you.

## 2021-04-03 NOTE — Telephone Encounter (Signed)
Yes, I will send in Tamiflu for her to take once daily for 7 days for flu prevention. Will send to [place].

## 2021-04-11 ENCOUNTER — Encounter: Payer: Self-pay | Admitting: Internal Medicine

## 2021-04-11 ENCOUNTER — Ambulatory Visit (INDEPENDENT_AMBULATORY_CARE_PROVIDER_SITE_OTHER): Payer: PPO | Admitting: Internal Medicine

## 2021-04-11 ENCOUNTER — Other Ambulatory Visit: Payer: Self-pay

## 2021-04-11 VITALS — BP 128/70 | HR 62 | Temp 97.0°F | Ht 66.0 in | Wt 141.0 lb

## 2021-04-11 DIAGNOSIS — M79672 Pain in left foot: Secondary | ICD-10-CM

## 2021-04-11 NOTE — Assessment & Plan Note (Signed)
Started after August biopsy on foot Diagnosed with gout based on high uric acid--but I think that diagnosis is in question Moh's surgery in [place]er boot after that---then it worsened  Currently no clear inflamed joints--just some tenderness I am concerned about CRPS---will refer to neurology

## 2021-04-11 NOTE — Patient Instructions (Signed)
Complex Regional Pain Syndrome Complex regional pain syndrome (CRPS) is a nerve disorder that is characterized by long-term (chronic) pain. The pain is usually in a hand, arm, foot, or leg. CRPS usually occurs after an injury or trauma, such as a fracture or sprain. There are two types of CRPS: Type 1. This type occurs after an injury with no known damage to a nerve. Type 2. This type occurs after an injury that damages a nerve. There are three stages of the condition: Stage 1. This stage, called the acute stage, may last for up to 3 months. Stage 2. This stage, called the dystrophic stage, may last for 3-12 months. Stage 3. This stage, called the atrophic stage, may start after one year. CRPS ranges from mild to severe. For most people, CRPS is mild and recovery happens over time. For others, CRPS lasts for a very long time and makes it hard to do everyday tasks. What are the causes? The exact cause of this condition is not known. It is usually triggered by an injury. What increases the risk? You are more likely to develop this condition if: You are female. You have any of the following: A wrist fracture that involves a lower arm bone (distal radius fracture). Ankle dislocation or fracture. A long surgery time. Possible nerve injury during surgery. What are the signs or symptoms? Signs and symptoms in the affected hand, arm, foot, or leg are different for each stage. Signs and symptoms of stage 1 include: Spontaneous pain that feels like a burning or prickling, tingling feeling (pins and needles sensation). Extremely sensitive skin. Swelling. Joint stiffness. Warmth and redness. Excessive sweating. Hair and nail growth that is faster than normal. Signs and symptoms of stage 2 include: Spreading of pain to the whole arm or leg. Increased skin sensitivity. Increased swelling and stiffness. Coolness of the skin. Blue discoloration of skin. Loss of skin wrinkles. Brittle  fingernails. Signs and symptoms of stage 3 include: Pain that spreads to other areas of the body but becomes less severe. More stiffness, leading to loss of motion. Skin that is pale, dry, shiny, or tightly stretched. How is this diagnosed? This condition may be diagnosed based on: Your signs and symptoms. A physical exam. There is no test to diagnose CRPS, but you may have tests: To check for bone changes that might indicate CRPS. These tests may include an MRI or bone scan. To rule out other possible causes of your symptoms. How is this treated? Early treatment may prevent CRPS from advancing past stage 1. There is not one treatment that works for everyone. Treatment options may include: Medicines, which may include: NSAIDs, such as ibuprofen. Steroids. Blood pressure drugs. Antidepressants. Anti-seizure drugs. Pain relievers. Exercise. Occupational therapy and physical therapy. Biofeedback. Mental health counseling. Numbing injections. Spinal surgery to implant a spinal cord stimulator or a pain pump. Follow these instructions at home: Medicines Take over-the-counter and prescription medicines only as told by your health care provider. Ask your health care provider if the medicine prescribed to you: Requires you to avoid driving or using machinery. Can cause constipation. You may need to take these actions to prevent or treat constipation: Drink enough fluid to keep your urine pale yellow. Take over-the-counter or prescription medicines. Eat foods that are high in fiber, such as beans, whole grains, and fresh fruits and vegetables. Limit foods that are high in fat and processed sugars, such as fried or sweet foods. General instructions Do not use any products that contain nicotine  or tobacco. These products include cigarettes, chewing tobacco, and vaping devices, such as e-cigarettes. If you need help quitting, ask your health care provider. Maintain a healthy  weight. Return to your normal activities as told by your health care provider. Ask your health care provider what activities are safe for you. Do exercises as told by your health care provider. Keep all follow-up visits. This is important. Where to find more information [employer] of Neurological Disorders and Stroke: [employer] Contact a health care provider if: Your symptoms change. Your symptoms get worse. You develop anxiety or depression. Get help right away if: Your pain is making you want to harm yourself. Get help right away if you feel like you may hurt yourself or others, or have thoughts about taking your own life. Go to your nearest emergency room or: Call 911. Call the [facility] at (514) 822-8472 or 988. This is open 24 hours a day. Text the Crisis Text Line at 236-391-3528. Summary Complex regional pain syndrome (CRPS) is a nerve disorder that causes long-term (chronic) pain, usually in a hand, arm, leg, or foot. CRPS usually occurs after an injury or trauma, such as a fracture or sprain. CRPS ranges from mild to severe. Early treatment may prevent CRPS from advancing to more severe stages. This information is not intended to replace advice given to you by your health care provider. Make sure you discuss any questions you have with your health care provider. Document Revised: 12/20/2020 Document Reviewed: 12/20/2020 Elsevier Patient Education  [place].

## 2021-04-11 NOTE — Progress Notes (Signed)
Subjective:    Patient ID: [name], female    DOB: [birth year], 87 y.o.   MRN: [id number]  HPI Here for follow up of gout with niece  Gout first noted around August---seemed to be triggered after Moh's surgery Was in [place] boot---and with second change, started in big toe  Didn't get any treatment till Dr Einar Pheasant  Rx allopurinol 50mg ---didn't take it after labs showed GFR down to 71 Tried colchicine--she feels it made her dizzy (and ?GI symptoms)  Has ongoing pain in foot Feels like a "plank" in my foot  Current Outpatient Medications on File Prior to Visit  Medication Sig Dispense Refill   acetaminophen (TYLENOL) 500 MG tablet Take 500-1,000 mg by mouth every 8 (eight) hours as needed for mild pain or headache.      apixaban (ELIQUIS) 2.5 MG TABS tablet Take 1 tablet (2.5 mg total) by mouth 2 (two) times daily. Dispense starter pack (Patient taking differently: Take 2.5 mg by mouth 2 (two) times daily.) 60 tablet 11   Cholecalciferol (VITAMIN D) 50 MCG (2000 UT) CAPS Take 4,000 Units by mouth daily.     Cranberry (AZO CRANBERRY GUMMIES) 500 MG CHEW Chew 2,000 mg by mouth daily.     Cyanocobalamin (B-12 PO) Take 1 tablet by mouth daily.      ezetimibe (ZETIA) 10 MG tablet Take 1 tablet (10 mg total) by mouth daily. 30 tablet 11   ferrous sulfate 325 (65 FE) MG tablet Take 1 tablet (325 mg total) by mouth 2 (two) times daily with a meal. 60 tablet 0   fluticasone (FLONASE) 50 MCG/ACT nasal spray Place 2 sprays into both nostrils daily as needed for allergies or rhinitis. 16 g 6   levothyroxine (SYNTHROID) 25 MCG tablet TAKE 1 TABLET BY MOUTH ONCE DAILY BEFOREBREAKFAST (Patient taking differently: Take 25 mcg by mouth daily before breakfast.) 30 tablet 3   magnesium oxide (MAG-OX) 400 MG tablet Take 1 tablet (400 mg total) by mouth daily. 30 tablet 2   metolazone (ZAROXOLYN) 2.5 MG tablet Take 2.5 mg by mouth daily as needed (For fluid).     ondansetron (ZOFRAN) 4 MG tablet Take 1  tablet (4 mg total) by mouth every 8 (eight) hours as needed for nausea or vomiting. 20 tablet 1   pantoprazole (PROTONIX) 40 MG tablet Take 1 tablet (40 mg total) by mouth 2 (two) times daily before a meal. 60 tablet 0   potassium chloride SA (KLOR-CON M20) 20 MEQ tablet Take 2 tablets (40 mEq total) by mouth 2 (two) times daily. Take 1 extra tablet on the days you take metolazone. 360 tablet 3   rosuvastatin (CRESTOR) 40 MG tablet Take 1 tablet (40 mg total) by mouth daily. 90 tablet 3   torsemide (DEMADEX) 100 MG tablet TAKE 1 TABLET BY MOUTH EVERY MORNING (EVENING DOSE 80 MG) (Patient taking differently: Take 100 mg by mouth daily.) 30 tablet 11   vitamin C (ASCORBIC ACID) 250 MG tablet Take 500 mg by mouth daily.     losartan (COZAAR) 25 MG tablet Take 0.5 tablets (12.5 mg total) by mouth daily. 90 tablet 3   pregabalin (LYRICA) 25 MG capsule Take 1 capsule (25 mg total) by mouth 2 (two) times daily. 60 capsule 0   No current facility-administered medications on file prior to visit.    Allergies  Allergen Reactions   Dilaudid [Hydromorphone] Other (See Comments)    Excessive Somnolence- Required Narcan   Fentanyl Other (See Comments)  Required Narcan when given with Dilaudid. Tolerated single dose when given during procedure 02/03/2019.    Codeine Nausea And Vomiting    Hallucinations    Morphine And Related Nausea And Vomiting    Hallucinations    Whole Blood Itching and Nausea And Vomiting    Pt and family stated pt will receive blood products but had a reaction requiring benadryl on a previous administration.    Sulfonamide Derivatives Other (See Comments)    Dry mouth    Past Medical History:  Diagnosis Date   Actinic keratosis 01/17/2015   R forearm   Adjustment disorder with anxiety    Anemia    Anginal pain ([place])    Arthritis    "some in my hands" (11/11/2012)   Atrial fibrillation (HCC)    Atypical mole 03/25/2018   L forearm - severe   Automatic implantable  cardioverter-defibrillator in situ    Blood in stool    Carotid artery stenosis 09/2007   a. 09/2007: 60-79% bilateral (stable); b. 10/2008: 40-59% R 60-79%    Chest pain, unspecified    Chronic airway obstruction, not elsewhere classified    Chronic bronchitis ([place])    "get it some; not q year" (11/11/2012)   Chronic diastolic CHF (congestive heart failure) ([place]) 06/04/2013   Chronic kidney disease, unspecified    Coronary artery disease    non-obstructive by 2006 cath   Displaced fracture of left femoral neck ([place]) 10/09/2018   Frequent UTI    "get them a couple times/yr" (11/11/2012)   GI bleed 03/28/2020   Hemorrhage of rectum and anus    High cholesterol    History of blood transfusion 04/2011   "after hip OR" (11/11/2012)   Hx of renal cell cancer    Hypertension 05/20/2011   Hypertr obst cardiomyop    Hypotension, unspecified    cardiac cath 2006..nonobstructive CAD 30-40s lesions.Marland KitchenETT 1/09 nondiagnostic due to poor HR response..Right Renal Cancer 2003   Long term (current) use of anticoagulants    Malignant neoplasm of kidney, except pelvis    Osteoarthritis of right hip    Other and unspecified coagulation defects    PONV (postoperative nausea and vomiting)    Presence of permanent cardiac pacemaker    RECTAL BLEEDING 10/13/2009   Qualifier: Diagnosis of  By: Chester Holstein NP, Paula     Renal cancer [facility]) 06/2001   Right   Secondary cardiomyopathy, unspecified    Sinus bradycardia    Squamous cell carcinoma of skin 01/17/2015   R lat wrist   Squamous cell carcinoma of skin 01/29/2018   R post upper leg - superficially invasive   Squamous cell carcinoma of skin 03/25/2018   L lat foot   Squamous cell carcinoma of skin 11/09/2020   left lat foot - EDC 01/01/21, recurrent 01/31/21 - MOHs 02/22/21   Urge incontinence     Past Surgical History:  Procedure Laterality Date   ABDOMINAL AORTOGRAM W/LOWER EXTREMITY N/A 02/12/2021   Procedure: ABDOMINAL AORTOGRAM W/LOWER EXTREMITY;   Surgeon: Waynetta Sandy, MD;  Location: [place] [facility];  Service: Cardiovascular;  Laterality: N/A;   ABDOMINAL HYSTERECTOMY  1975   for benign causes   APPENDECTOMY     BI-VENTRICULAR PACEMAKER UPGRADE  05/04/2010   BIOPSY  02/28/2019   Procedure: BIOPSY;  Surgeon: Milus Banister, MD;  Location: [facility] ENDOSCOPY;  Service: Endoscopy;;   CARDIAC CATHETERIZATION  2006   CARDIOVERSION N/A 02/20/2018   Procedure: CARDIOVERSION;  Surgeon: Minna Merritts, MD;  Location: [facility] ORS;  Service: Cardiovascular;  Laterality: N/A;   CARDIOVERSION N/A 03/27/2018   Procedure: CARDIOVERSION;  Surgeon: Minna Merritts, MD;  Location: [facility] ORS;  Service: Cardiovascular;  Laterality: N/A;   CATARACT EXTRACTION W/ INTRAOCULAR LENS  IMPLANT, BILATERAL  01/2006-02-2006   CHOLECYSTECTOMY N/A 11/11/2012   Procedure: LAPAROSCOPIC CHOLECYSTECTOMY WITH INTRAOPERATIVE CHOLANGIOGRAM;  Surgeon: Imogene Burn. Georgette Dover, MD;  Location: [place];  Service: General;  Laterality: N/A;   COLONOSCOPY WITH PROPOFOL N/A 02/28/2019   Procedure: COLONOSCOPY WITH PROPOFOL;  Surgeon: Milus Banister, MD;  Location: [facility] ENDOSCOPY;  Service: Endoscopy;  Laterality: N/A;   ENTEROSCOPY N/A 03/30/2020   Procedure: ENTEROSCOPY;  Surgeon: Lavena Bullion, DO;  Location: [place];  Service: Gastroenterology;  Laterality: N/A;   ENTEROSCOPY N/A 02/10/2021   Procedure: ENTEROSCOPY;  Surgeon: Carol Ada, MD;  Location: [place];  Service: Endoscopy;  Laterality: N/A;   EP IMPLANTABLE DEVICE N/A 02/21/2016   Procedure: ICD Generator Changeout;  Surgeon: Deboraha Sprang, MD;  Location: [place] [facility];  Service: Cardiovascular;  Laterality: N/A;   ESOPHAGOGASTRODUODENOSCOPY (EGD) WITH PROPOFOL N/A 02/28/2019   Procedure: ESOPHAGOGASTRODUODENOSCOPY (EGD) WITH PROPOFOL;  Surgeon: Milus Banister, MD;  Location: [facility] ENDOSCOPY;  Service: Endoscopy;  Laterality: N/A;   GIVENS CAPSULE STUDY N/A 03/15/2019   Procedure: GIVENS  CAPSULE STUDY;  Surgeon: Thornton Park, MD;  Location: [place];  Service: Gastroenterology;  Laterality: N/A;   HOT HEMOSTASIS N/A 03/30/2020   Procedure: HOT HEMOSTASIS (ARGON PLASMA COAGULATION/BICAP);  Surgeon: Lavena Bullion, DO;  Location[facility] ENDOSCOPY;  Service: Gastroenterology;  Laterality: N/A;   HOT HEMOSTASIS N/A 02/10/2021   Procedure: HOT HEMOSTASIS (ARGON PLASMA COAGULATION/BICAP);  Surgeon: Carol Ada, MD;  Location: [place];  Service: Endoscopy;  Laterality: N/A;   INSERT / REPLACE / REMOVE PACEMAKER  05-01-11   02-28-05-/05-04-10-ICD-MEDTRONIC MAXIMAL DR   JOINT REPLACEMENT     LAPAROSCOPIC CHOLECYSTECTOMY  11/11/2012   LAPAROSCOPIC LYSIS OF ADHESIONS N/A 11/11/2012   Procedure: LAPAROSCOPIC LYSIS OF ADHESIONS;  Surgeon: Imogene Burn. Georgette Dover, MD;  Location: [place];  Service: General;  Laterality: N/A;   NEPHRECTOMY Right 06/2001    S/P RENAL CELL CANCER   PERIPHERAL VASCULAR INTERVENTION Bilateral 02/12/2021   Procedure: PERIPHERAL VASCULAR INTERVENTION;  Surgeon: Waynetta Sandy, MD;  Location: [place] [facility];  Service: Cardiovascular;  Laterality: Bilateral;  Iliac artery stents   PRESSURE SENSOR/CARDIOMEMS N/A 02/03/2019   Procedure: PRESSURE SENSOR/CARDIOMEMS;  Surgeon: Larey Dresser, MD;  Location: [place] [facility];  Service: Cardiovascular;  Laterality: N/A;   RIGHT HEART CATH N/A 11/09/2018   Procedure: RIGHT HEART CATH;  Surgeon: Larey Dresser, MD;  Location: [place] [facility];  Service: Cardiovascular;  Laterality: N/A;   RIGHT HEART CATH N/A 03/08/2019   Procedure: RIGHT HEART CATH;  Surgeon: Larey Dresser, MD;  Location: [place] [facility];  Service: Cardiovascular;  Laterality: N/A;   SUBMUCOSAL TATTOO INJECTION  02/28/2019   Procedure: SUBMUCOSAL TATTOO INJECTION;  Surgeon: Milus Banister, MD;  Location: [facility] ENDOSCOPY;  Service: Endoscopy;;   TOTAL HIP ARTHROPLASTY Right 05/03/2011   Procedure: TOTAL HIP ARTHROPLASTY ANTERIOR  APPROACH;  Surgeon: Mcarthur Rossetti;  Location: [facility] ORS;  Service: Orthopedics;  Laterality: Right;  Removal of Cannulated Screws Right Hip, Right Direct Anterior Hip Replacement   TOTAL HIP ARTHROPLASTY Left 10/09/2018   Procedure: TOTAL HIP ARTHROPLASTY ANTERIOR APPROACH;  Surgeon: Rod Can, MD;  Location: [place];  Service: Orthopedics;  Laterality: Left;  Family History  Problem Relation Age of Onset   Heart failure Mother    Early death Father        car accident   Breast cancer Maternal Aunt 82   Breast cancer Cousin    Breast cancer Other    Colon cancer Neg Hx     Social History   Socioeconomic History   Marital status: Widowed    Spouse name: Not on file   Number of children: 2   Years of education: high school   Highest education level: Not on file  Occupational History   Occupation: Retired    Fish farm manager: RETIRED  Tobacco Use   Smoking status: Former    Packs/day: 0.50    Years: 40.00    Pack years: 20.00    Types: Cigarettes    Quit date: 05/06/2001    Years since quitting: 19.9   Smokeless tobacco: Never  Vaping Use   Vaping Use: Never used  Substance and Sexual Activity   Alcohol use: No    Alcohol/week: 0.0 standard drinks   Drug use: No   Sexual activity: Not Currently  Other Topics Concern   Not on file  Social History Narrative   Would desire CPR   07/11/20   From: the area   Living: with great-grandson - [name] (2003)   Work: retired - clerical      Family: 2 children - [name] and Corwith - 2 grand chlidren - 5 great grand children      Enjoys: spending time with friends - watch move, eat out, spend time      Exercise: walking around the house   Diet: not good, limits red meat, limits salt, low appetite      Safety   Seat belts: Yes    Guns: Yes  and secure   Safe in relationships: Yes    Social Determinants of Health   Financial Resource Strain: Not on file  Food Insecurity: Not on file  Transportation Needs: Not on file   Physical Activity: Not on file  Stress: Not on file  Social Connections: Not on file  Intimate Partner Violence: Not on file   Review of Systems No other swollen or painful joints No fever     Objective:   Physical Exam Skin:    Comments: Tender, slightly reddened ulcer on lateral dorsum of left foot No redness by MTP joints Slight tenderness by 2-5th MTP (vague), ankles and all over foot           Assessment & Plan:

## 2021-04-12 ENCOUNTER — Encounter: Payer: Self-pay | Admitting: *Deleted

## 2021-04-12 ENCOUNTER — Telehealth: Payer: Self-pay

## 2021-04-12 NOTE — Addendum Note (Signed)
Addended by: Viviana Simpler I on: 04/12/2021 12:51 PM   Modules accepted: Orders

## 2021-04-12 NOTE — Telephone Encounter (Signed)
Spoke with patient today when following up on referrals. Patient has declined referral to the pain clinic at this time. Referral was closed.

## 2021-04-18 ENCOUNTER — Telehealth (HOSPITAL_COMMUNITY): Payer: Self-pay | Admitting: Adult Health

## 2021-04-18 NOTE — Telephone Encounter (Signed)
°  Cardiomems Reading   Goal 17  Most recent reading 24. Suggestive of fluid accumulation.   Please call and instruct to take take extra 20 mg torsemide for he next 2 days.   Discuss low salt food choices and limiting fluid intake.   Thanks,  Jacqulyne Gladue NP-C  11:51 AM

## 2021-04-19 NOTE — Telephone Encounter (Signed)
Spoke with pt she is aware and agreeable with plan.  

## 2021-04-25 DIAGNOSIS — S91302A Unspecified open wound, left foot, initial encounter: Secondary | ICD-10-CM | POA: Diagnosis not present

## 2021-05-10 ENCOUNTER — Other Ambulatory Visit: Payer: Self-pay

## 2021-05-10 ENCOUNTER — Ambulatory Visit (INDEPENDENT_AMBULATORY_CARE_PROVIDER_SITE_OTHER): Payer: PPO

## 2021-05-10 DIAGNOSIS — Z23 Encounter for immunization: Secondary | ICD-10-CM | POA: Diagnosis not present

## 2021-05-15 ENCOUNTER — Ambulatory Visit: Payer: PPO | Admitting: Dermatology

## 2021-05-17 ENCOUNTER — Ambulatory Visit (INDEPENDENT_AMBULATORY_CARE_PROVIDER_SITE_OTHER): Payer: PPO

## 2021-05-17 ENCOUNTER — Other Ambulatory Visit (HOSPITAL_COMMUNITY): Payer: Self-pay | Admitting: Cardiology

## 2021-05-17 ENCOUNTER — Ambulatory Visit (HOSPITAL_COMMUNITY)
Admission: RE | Admit: 2021-05-17 | Discharge: 2021-05-17 | Disposition: A | Discharge status: Home / Self Care | Payer: PPO | Source: Ambulatory Visit | Attending: Cardiology | Admitting: Cardiology

## 2021-05-17 ENCOUNTER — Encounter (HOSPITAL_COMMUNITY): Payer: Self-pay | Admitting: Cardiology

## 2021-05-17 ENCOUNTER — Other Ambulatory Visit: Payer: Self-pay

## 2021-05-17 VITALS — BP 140/60 | HR 60 | Wt 145.8 lb

## 2021-05-17 DIAGNOSIS — Z905 Acquired absence of kidney: Secondary | ICD-10-CM | POA: Diagnosis not present

## 2021-05-17 DIAGNOSIS — I5032 Chronic diastolic (congestive) heart failure: Secondary | ICD-10-CM

## 2021-05-17 DIAGNOSIS — I130 Hypertensive heart and chronic kidney disease with heart failure and stage 1 through stage 4 chronic kidney disease, or unspecified chronic kidney disease: Secondary | ICD-10-CM | POA: Diagnosis not present

## 2021-05-17 DIAGNOSIS — J449 Chronic obstructive pulmonary disease, unspecified: Secondary | ICD-10-CM | POA: Insufficient documentation

## 2021-05-17 DIAGNOSIS — I4819 Other persistent atrial fibrillation: Secondary | ICD-10-CM | POA: Diagnosis not present

## 2021-05-17 DIAGNOSIS — I5042 Chronic combined systolic (congestive) and diastolic (congestive) heart failure: Secondary | ICD-10-CM | POA: Diagnosis not present

## 2021-05-17 DIAGNOSIS — Z79899 Other long term (current) drug therapy: Secondary | ICD-10-CM | POA: Diagnosis not present

## 2021-05-17 DIAGNOSIS — I4821 Permanent atrial fibrillation: Secondary | ICD-10-CM | POA: Diagnosis not present

## 2021-05-17 DIAGNOSIS — Z87891 Personal history of nicotine dependence: Secondary | ICD-10-CM | POA: Diagnosis not present

## 2021-05-17 DIAGNOSIS — I509 Heart failure, unspecified: Secondary | ICD-10-CM | POA: Diagnosis not present

## 2021-05-17 DIAGNOSIS — I428 Other cardiomyopathies: Secondary | ICD-10-CM | POA: Diagnosis not present

## 2021-05-17 DIAGNOSIS — Z85528 Personal history of other malignant neoplasm of kidney: Secondary | ICD-10-CM | POA: Diagnosis not present

## 2021-05-17 DIAGNOSIS — Z85828 Personal history of other malignant neoplasm of skin: Secondary | ICD-10-CM | POA: Insufficient documentation

## 2021-05-17 DIAGNOSIS — N183 Chronic kidney disease, stage 3 unspecified: Secondary | ICD-10-CM | POA: Insufficient documentation

## 2021-05-17 DIAGNOSIS — Z7901 Long term (current) use of anticoagulants: Secondary | ICD-10-CM | POA: Insufficient documentation

## 2021-05-17 DIAGNOSIS — I350 Nonrheumatic aortic (valve) stenosis: Secondary | ICD-10-CM | POA: Diagnosis not present

## 2021-05-17 DIAGNOSIS — R0789 Other chest pain: Secondary | ICD-10-CM | POA: Diagnosis not present

## 2021-05-17 DIAGNOSIS — D509 Iron deficiency anemia, unspecified: Secondary | ICD-10-CM | POA: Insufficient documentation

## 2021-05-17 DIAGNOSIS — Z9181 History of falling: Secondary | ICD-10-CM | POA: Diagnosis not present

## 2021-05-17 DIAGNOSIS — I272 Pulmonary hypertension, unspecified: Secondary | ICD-10-CM | POA: Diagnosis not present

## 2021-05-17 DIAGNOSIS — Z95810 Presence of automatic (implantable) cardiac defibrillator: Secondary | ICD-10-CM | POA: Diagnosis not present

## 2021-05-17 LAB — BASIC METABOLIC PANEL
Anion gap: 9 (ref 5–15)
BUN: 33 mg/dL — ABNORMAL HIGH (ref 8–23)
CO2: 30 mmol/L (ref 22–32)
Calcium: 9.4 mg/dL (ref 8.9–10.3)
Chloride: 98 mmol/L (ref 98–111)
Creatinine, Ser: 1.46 mg/dL — ABNORMAL HIGH (ref 0.44–1.00)
GFR, Estimated: 35 mL/min — ABNORMAL LOW (ref >60)
Glucose, Bld: 94 mg/dL (ref 70–99)
Potassium: 4.4 mmol/L (ref 3.5–5.1)
Sodium: 137 mmol/L (ref 135–145)

## 2021-05-17 LAB — CBC
HCT: 35.7 % — ABNORMAL LOW (ref 36.0–46.0)
Hemoglobin: 11.2 g/dL — ABNORMAL LOW (ref 12.0–15.0)
MCH: 27.6 pg (ref 26.0–34.0)
MCHC: 31.4 g/dL (ref 30.0–36.0)
MCV: 87.9 fL (ref 80.0–100.0)
Platelets: 218 10*3/uL (ref 150–400)
RBC: 4.06 MIL/uL (ref 3.87–5.11)
RDW: 16.5 % — ABNORMAL HIGH (ref 11.5–15.5)
WBC: 4.9 10*3/uL (ref 4.0–10.5)
nRBC: 0 % (ref 0.0–0.2)

## 2021-05-17 LAB — BRAIN NATRIURETIC PEPTIDE: B Natriuretic Peptide: 252.2 pg/mL — ABNORMAL HIGH (ref 0.0–100.0)

## 2021-05-17 MED ORDER — METOLAZONE 2.5 MG PO TABS: 2.5000 mg | ORAL_TABLET | Freq: Every day | ORAL | 2 refills | Status: DC

## 2021-05-17 NOTE — Patient Instructions (Addendum)
Take Metolazone 2.5 mg every Friday( Take an additional Potassium 20 meq with each Metolazone)  Labs done today, your results will be available in MyChart, we will contact you for abnormal readings.  Follow up lab work in 10 days  Your physician has requested that you have an echocardiogram. Echocardiography is a painless test that uses sound waves to create images of your heart. It provides your doctor with information about the size and shape of your heart and how well your hearts chambers and valves are working. This procedure takes approximately one hour. There are no restrictions for this procedure.  Your physician recommends that you schedule a follow-up appointment in: 3-4 weeks w Dr. [name]  If you have any questions or concerns before your next appointment please send [place] a message through [facility] or call our office at [phone].    TO LEAVE A MESSAGE FOR THE NURSE SELECT OPTION 2, PLEASE LEAVE A MESSAGE INCLUDING: YOUR NAME DATE OF BIRTH CALL BACK NUMBER REASON FOR CALL**this is important as we prioritize the call backs  YOU WILL RECEIVE A CALL BACK THE SAME DAY AS LONG AS YOU CALL BEFORE 4:00 PM  At the [facility], you and your health needs are our priority. As part of our continuing mission to provide you with exceptional heart care, we have created designated Provider Care Teams. These Care Teams include your primary Cardiologist (physician) and Advanced Practice Providers (APPs- Physician Assistants and Nurse Practitioners) who all work together to provide you with the care you need, when you need it.   You may see any of the following providers on your designated Care Team at your next follow up: Dr Glori Bickers Dr Haynes Kerns, NP Lyda Jester, [place] [facility] [place], [place] Audry Riles, PharmD   Please be sure to bring in all your medications bottles to every appointment.

## 2021-05-18 LAB — CUP PACEART REMOTE DEVICE CHECK
Battery Remaining Longevity: 33 mo
Battery Voltage: 2.95 V
Brady Statistic AP VP Percent: 0 %
Brady Statistic AP VS Percent: 0 %
Brady Statistic AS VP Percent: 0 %
Brady Statistic AS VS Percent: 0 %
Brady Statistic RA Percent Paced: 0 %
Brady Statistic RV Percent Paced: 69.1 %
Date Time Interrogation Session: 20230112134020
HighPow Impedance: 40 Ohm
HighPow Impedance: 54 Ohm
Implantable Lead Implant Date: 20061026
Implantable Lead Implant Date: 20111230
Implantable Lead Location: 753859
Implantable Lead Location: 753860
Implantable Lead Model: 5076
Implantable Lead Model: 6947
Implantable Pulse Generator Implant Date: 20171018
Lead Channel Impedance Value: 342 Ohm
Lead Channel Impedance Value: 418 Ohm
Lead Channel Impedance Value: 475 Ohm
Lead Channel Pacing Threshold Amplitude: 0.5 V
Lead Channel Pacing Threshold Pulse Width: 0.4 ms
Lead Channel Sensing Intrinsic Amplitude: 0.25 mV
Lead Channel Sensing Intrinsic Amplitude: 0.375 mV
Lead Channel Sensing Intrinsic Amplitude: 9.125 mV
Lead Channel Sensing Intrinsic Amplitude: 9.125 mV
Lead Channel Setting Pacing Amplitude: 2.5 V
Lead Channel Setting Pacing Pulse Width: 0.4 ms
Lead Channel Setting Sensing Sensitivity: 0.3 mV

## 2021-05-19 DIAGNOSIS — I5033 Acute on chronic diastolic (congestive) heart failure: Secondary | ICD-10-CM | POA: Diagnosis not present

## 2021-05-19 DIAGNOSIS — R2689 Other abnormalities of gait and mobility: Secondary | ICD-10-CM | POA: Diagnosis not present

## 2021-05-19 DIAGNOSIS — M6281 Muscle weakness (generalized): Secondary | ICD-10-CM | POA: Diagnosis not present

## 2021-05-19 DIAGNOSIS — J449 Chronic obstructive pulmonary disease, unspecified: Secondary | ICD-10-CM | POA: Diagnosis not present

## 2021-05-19 DIAGNOSIS — Z4789 Encounter for other orthopedic aftercare: Secondary | ICD-10-CM | POA: Diagnosis not present

## 2021-05-19 DIAGNOSIS — J90 Pleural effusion, not elsewhere classified: Secondary | ICD-10-CM | POA: Diagnosis not present

## 2021-05-19 NOTE — Progress Notes (Signed)
PCP: Lesleigh Noe, MD Cardiology: Dr. Rockey Situ HF Cardiology: Dr. [name]  88 y.o. with history of permanent atrial fibrillation, chronic diastolic CHF, CKD stage 3, smoking/prior COPD, renal cell CA s/p right nephrectomy.  She has a prior history of HFrEF with nonischemic cardiomyoapthy and had a Medtronic ICD placed.  However, subsequently her EF has recovered.  More recently, she has had symptomatic HFpEF.    She has had dyspnea and edema for "a long time," worse starting 1/20. In 6/20, she had left hip ORIF after mechanical fall.  Echo in 6/20 showed EF 60-65%, mild RV dilation with normal systolic function, PASP 93 mmHg.  After she got home from the hospital stay post-ORIF, she noted marked peripheral edema.  She was short of breath walking short distances.  +orthopnea.  She came back to the hospital on 11/04/18 and was admitted with volume overload.  She was diuresed aggressively and lost about 22 lbs.  RHC showed pulmonary venous hypertension.  PCWP tracing had prominent v-waves in absence of significant MR, suggesting significant diastolic dysfunction.   She has been noted to have significant Fe deficiency anemia, but FOBT was negative during 7/20 hospitalization.   She was admitted in 11/20 with symptomatic anemia, GI workup concerning for ischemic bowel.  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 Doren Custard with VVS and conservative management recommended.  Capsule endoscopy showed no definite source of bleeding.  She was volume overloaded due to significant RV dysfunction and diuresed, she also developed AKI on CKD stage 3.      In 10/22, she had bilateral CIA stents placed. ABIs in 11/22 showed patent stents.  She had a skin cancer removed from her left leg. 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   Today she returns for HF follow up here with her daughter. She had minimal dyspnea walking into the office today.  She has mild dyspnea  walking longer distances.  Occasional palpitations.  Episodes of atypical chest pain several times a week (nonexertional), can last up to 15 minutes.  She has noted peripheral edema recently, took a metolazone dose on Tuesday of this week. Stool has been dark recently. No lightheadedness, falls.   Medtronic device interrogation: Fluid index < threshold but impedance trending down.   Cardiomems PADP: 23, goal 17  Labs (7/20): K 3.7, creatinine 1.44 => 1.33 Labs (12/20): pro-BNP 431, K 4.4, creatinine 1.24, hgb 8.2 Labs (10/21): K 4, creatinine 1.83 Labs (12/21): K 3.5, creatinine 1.45 Labs (2/22): K 5.8 (Lokelma ordered), creatinine 1.39 Labs (3/22): K 4.4  Creatinine 1.42 Labs (8/22): K 4.2, creatinine 1.46, hgb 10.7 Labs (11/22): K 4.5, creatinine 1.69, LFTs normal  PMH: 1. COPD 2. Renal cell carcinoma: S/p right kidney resection.  3. CKD stage 3 4. Fe deficiency anemia 5. Atrial fibrillation: Permanent. She has failed amiodarone, Tikosyn, and flecainide as well as multiple cardioversions. 6. Chronic diastolic CHF: She has history of prior HFrEF with nonischemic cardiomyopathy.  She has a Medtronic ICD.   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  - More recently, EF has been in normal range but she has had diastolic CHF.  - Echo (5/40): EF 60-65%, mildly dilated RV with PASP 93 mmHg, mild-moderate TR.  - RHC (7/20): mean RA 11, PA 64/18 mean 39, mean PCWP 24 with v waves to 41, CI 4.21, PVR 1.9 WU.  - Cardiomems placement - RHC (11/20): mean RA 13, PA 63/23 mean 25 with  v waves to 47 (MR only mild by echo), mean PCWP 25, CI 3.18, PVR 2.6 WU - Echo (11/21): EF 65-70%, moderate LVH, D-shaped septum with mildly decreased RV systolic function, RVSP 16.0 mmHg, severe RAE, severe TR, mod-sev AS. 7. Hyperlipidemia 8. Carotid stenosis: Carotid dopplers (7/37) with 10-62% LICA stenosis.   9. Possible ischemic bowel in 11/20 10. PAD: Mesenteric artery dopplers (11/20) with 70-99% celiac and  SMA stenosis. Conservative management per Dr. Doren Custard.  - 10/22 bilateral CIA stents.  - 11/22 ABIs: 1.07 left, 0.83 right with patent CIA stents.  11. 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  12. Squamous cell skin cancer.   Social History   Socioeconomic History   Marital status: Widowed    Spouse name: Not on file   Number of children: 2   Years of education: high school   Highest education level: Not on file  Occupational History   Occupation: Retired    Fish farm manager: RETIRED  Tobacco Use   Smoking status: Former    Packs/day: 0.50    Years: 40.00    Pack years: 20.00    Types: Cigarettes    Quit date: 05/06/2001    Years since quitting: 20.0   Smokeless tobacco: Never  Vaping Use   Vaping Use: Never used  Substance and Sexual Activity   Alcohol use: No    Alcohol/week: 0.0 standard drinks   Drug use: No   Sexual activity: Not Currently  Other Topics Concern   Not on file  Social History Narrative   Would desire CPR   07/11/20   From: the area   Living: with great-grandson - [name] (2003)   Work: retired - clerical      Family: 2 children - [name] and [place] - 2 grand chlidren - 5 great grand children      Enjoys: spending time with friends - watch move, eat out, spend time      Exercise: walking around the house   Diet: not good, limits red meat, limits salt, low appetite      Safety   Seat belts: Yes    Guns: Yes  and secure   Safe in relationships: Yes    Social Determinants of Health   Financial Resource Strain: Not on file  Food Insecurity: Not on file  Transportation Needs: Not on file  Physical Activity: Not on file  Stress: Not on file  Social Connections: Not on file  Intimate Partner Violence: Not on file   Family History  Problem Relation Age of Onset   Heart failure Mother    Early death Father        car accident   Breast cancer Maternal Aunt 82   Breast cancer Cousin    Breast cancer Other    Colon cancer Neg Hx     ROS: All systems reviewed and negative except as per HPI.   Current Outpatient Medications  Medication Sig Dispense Refill   acetaminophen (TYLENOL) 500 MG tablet Take 500-1,000 mg by mouth every 8 (eight) hours as needed for mild pain or headache.      apixaban (ELIQUIS) 2.5 MG TABS tablet Take 1 tablet (2.5 mg total) by mouth 2 (two) times daily. Dispense starter pack 60 tablet 11   Cholecalciferol (VITAMIN D) 50 MCG (2000 UT) CAPS Take 4,000 Units by mouth daily.     colchicine 0.6 MG tablet Take 0.6 mg by mouth daily.     Cranberry (AZO CRANBERRY GUMMIES) 500  MG CHEW Chew 2,000 mg by mouth daily.     Cyanocobalamin (B-12 PO) Take 1 tablet by mouth daily.      ezetimibe (ZETIA) 10 MG tablet Take 1 tablet (10 mg total) by mouth daily. 30 tablet 11   ferrous sulfate 325 (65 FE) MG tablet Take 1 tablet (325 mg total) by mouth 2 (two) times daily with a meal. 60 tablet 0   fluticasone (FLONASE) 50 MCG/ACT nasal spray Place 2 sprays into both nostrils daily as needed for allergies or rhinitis. 16 g 6   levothyroxine (SYNTHROID) 25 MCG tablet TAKE 1 TABLET BY MOUTH ONCE DAILY BEFOREBREAKFAST 30 tablet 3   losartan (COZAAR) 25 MG tablet Take 0.5 tablets (12.5 mg total) by mouth daily. 90 tablet 3   magnesium oxide (MAG-OX) 400 MG tablet Take 1 tablet (400 mg total) by mouth daily. 30 tablet 2   metolazone (ZAROXOLYN) 2.5 MG tablet Take 1 tablet (2.5 mg total) by mouth daily. 60 tablet 2   ondansetron (ZOFRAN) 4 MG tablet Take 1 tablet (4 mg total) by mouth every 8 (eight) hours as needed for nausea or vomiting. 20 tablet 1   pantoprazole (PROTONIX) 40 MG tablet Take 1 tablet (40 mg total) by mouth 2 (two) times daily before a meal. 60 tablet 0   potassium chloride SA (KLOR-CON M20) 20 MEQ tablet Take 2 tablets (40 mEq total) by mouth 2 (two) times daily. Take 1 extra tablet on the days you take metolazone. 360 tablet 3   pregabalin (LYRICA) 25 MG capsule Take 1 capsule (25 mg total) by mouth 2  (two) times daily. 60 capsule 0   rosuvastatin (CRESTOR) 40 MG tablet Take 1 tablet (40 mg total) by mouth daily. 90 tablet 3   torsemide (DEMADEX) 100 MG tablet Take 100 mg by mouth daily. 80 mg in the PM     vitamin C (ASCORBIC ACID) 250 MG tablet Take 500 mg by mouth daily.     No current facility-administered medications for this encounter.   Wt Readings from Last 3 Encounters:  05/17/21 66.1 kg (145 lb 12.8 oz)  04/11/21 64 kg (141 lb)  03/21/21 64.4 kg (141 lb 14.4 oz)   BP 140/60    Pulse 60    Wt 66.1 kg (145 lb 12.8 oz)    SpO2 97%    BMI 23.53 kg/m    PHYSICAL EXAM: General: NAD Neck: JVP 10 cm, no thyromegaly or thyroid nodule.  Lungs: Clear to auscultation bilaterally with normal respiratory effort. CV: Nondisplaced PMI.  Heart regular S1/S2, no S3/S4, 3/6 SEM RUSB with clear S2.  1+ ankle edema.  No carotid bruit.  Normal pedal pulses.  Abdomen: Soft, nontender, no hepatosplenomegaly, no distention.  Skin: Intact without lesions or rashes.  Neurologic: Alert and oriented x 3.  Psych: Normal affect. Extremities: No clubbing or cyanosis.  HEENT: Normal.   Assessment/Plan: 1. Chronic diastolic CHF: Echo in 4/03 showed EF 60-65%, mild RV dilation with normal systolic function, PASP 93 mmHg.  She had remote nonischemic cardiomyopathy with recovery of EF, has Medtronic ICD.  I suspect that there is significant RV dysfunction. Atrial fibrillation likely contributes, but this appears to be permanent now as she has failed multiple anti-arrhythmics and is reasonably rate-controlled. [place] 11/20 showed elevated filling pressures and pulmonary venous hypertension.  Prominent v-waves on PCWP tracing likely due to stiff ventricle/diastolic dysfunction as she had only mild MR on echo. Echo (11/21) showed EF 65-70%, moderate LVH, RVSP 85.9 mmHg, severe  RAE, severe TR, mod-sev AS. NYHA Class II-III symptoms. Exam, Cardiomems, and Optivol today suggest at least mild volume overload. - Continue  Torsemide 100 mg qam/ 80 mg qpm. - I will have her start metolazone 2.5 once weekly every Friday with am torsemide.  She will also take and extra KCl 20 with each metolazone dose.  BMET today and in 10 days.  2. Hypertension: BP mildly elevated today but will not change meds.  3. Pulmonary hypertension: PA systolic pressure estimated 86 mmHg on last echo. Based on [place] 11/20, she has primarily pulmonary venous hypertension, not candidate for pulmonary vasodilators.  4. CKD: Stage 3.  Last SCr 1.69.  - Consider SGLT2 inhibitor in the future.  5. GI bleeding: Fe deficiency anemia.  Capsule endoscopy during 11/20 admission did not show bleeding source. Enteroscopy in 11/21 with two actively bleeding angioectasias treated with APC. She has had some dark stool recently.  Consider Heyde syndrome with moderate to severe AS on last echo.  - CBC today.  6. Atrial fibrillation: Permanent now.  Rate control is reasonable. She has failed amiodarone, Tikosyn, and flecainide as well as multiple cardioversions.  - Continue Eliquis.  7. Aortic stenosis: Moderate-severe on last echo in 11/21.  S2 is clear on exam.  - She needs repeat echo, likely would be TAVR candidate.   F/u in 3-4 weeks after echo.   Loralie Champagne  05/19/2021

## 2021-05-28 ENCOUNTER — Ambulatory Visit (HOSPITAL_COMMUNITY)
Admission: RE | Admit: 2021-05-28 | Discharge: 2021-05-28 | Disposition: A | Discharge status: Home / Self Care | Payer: PPO | Source: Ambulatory Visit | Attending: Cardiology | Admitting: Cardiology

## 2021-05-28 ENCOUNTER — Other Ambulatory Visit: Payer: Self-pay

## 2021-05-28 ENCOUNTER — Other Ambulatory Visit (HOSPITAL_COMMUNITY): Payer: PPO

## 2021-05-28 DIAGNOSIS — I509 Heart failure, unspecified: Secondary | ICD-10-CM | POA: Diagnosis not present

## 2021-05-28 LAB — BASIC METABOLIC PANEL
Anion gap: 14 (ref 5–15)
BUN: 46 mg/dL — ABNORMAL HIGH (ref 8–23)
CO2: 30 mmol/L (ref 22–32)
Calcium: 9.7 mg/dL (ref 8.9–10.3)
Chloride: 95 mmol/L — ABNORMAL LOW (ref 98–111)
Creatinine, Ser: 1.63 mg/dL — ABNORMAL HIGH (ref 0.44–1.00)
GFR, Estimated: 31 mL/min — ABNORMAL LOW (ref >60)
Glucose, Bld: 95 mg/dL (ref 70–99)
Potassium: 4.2 mmol/L (ref 3.5–5.1)
Sodium: 139 mmol/L (ref 135–145)

## 2021-05-29 DIAGNOSIS — L988 Other specified disorders of the skin and subcutaneous tissue: Secondary | ICD-10-CM | POA: Diagnosis not present

## 2021-05-29 DIAGNOSIS — Z4801 Encounter for change or removal of surgical wound dressing: Secondary | ICD-10-CM | POA: Diagnosis not present

## 2021-05-29 NOTE — Progress Notes (Signed)
Remote ICD transmission.   

## 2021-05-30 ENCOUNTER — Telehealth (HOSPITAL_COMMUNITY): Payer: Self-pay | Admitting: *Deleted

## 2021-05-30 NOTE — Telephone Encounter (Signed)
Pt called to inquire about labs done this week, she thought that her hgb was going to be check but didn't see any results for it. Advised pt only bmp was done this week to monitor her kidney function, hgb done 1/12 was 11.2, which is great for her. She states she has been having dark, tarry stools recently and is concerned about bleeding, advised pt to f/u w/PCP, she is agreeable

## 2021-06-04 ENCOUNTER — Ambulatory Visit (INDEPENDENT_AMBULATORY_CARE_PROVIDER_SITE_OTHER): Payer: PPO | Admitting: Internal Medicine

## 2021-06-04 ENCOUNTER — Encounter: Payer: Self-pay | Admitting: Oncology

## 2021-06-04 ENCOUNTER — Encounter: Payer: Self-pay | Admitting: Internal Medicine

## 2021-06-04 ENCOUNTER — Other Ambulatory Visit: Payer: Self-pay

## 2021-06-04 VITALS — BP 136/68 | HR 59 | Temp 97.9°F | Ht 66.0 in | Wt 144.0 lb

## 2021-06-04 DIAGNOSIS — D649 Anemia, unspecified: Secondary | ICD-10-CM

## 2021-06-04 DIAGNOSIS — R195 Other fecal abnormalities: Secondary | ICD-10-CM | POA: Insufficient documentation

## 2021-06-04 NOTE — Progress Notes (Signed)
Subjective:    Patient ID: [name], female    DOB: February 22, 1934, 88 y.o.   MRN: [id number]  HPI Here due to concerns about anemia  Has noticed some dark stools--has had them before Went to CHF clinic 1/12 Blood work done--Hgb 11.2 Was supposed to be rechecked when renal function done GFR stable in low 30's  Does take iron pill Still dark stools at times Breathing is good---hasn't need oxygen in months Some decreased stamina---even walking short distances (nothing new)  Current Outpatient Medications on File Prior to Visit  Medication Sig Dispense Refill   acetaminophen (TYLENOL) 500 MG tablet Take 500-1,000 mg by mouth every 8 (eight) hours as needed for mild pain or headache.      apixaban (ELIQUIS) 2.5 MG TABS tablet Take 1 tablet (2.5 mg total) by mouth 2 (two) times daily. Dispense starter pack 60 tablet 11   Cholecalciferol (VITAMIN D) 50 MCG (2000 UT) CAPS Take 4,000 Units by mouth daily.     colchicine 0.6 MG tablet Take 0.6 mg by mouth daily.     Cranberry (AZO CRANBERRY GUMMIES) 500 MG CHEW Chew 2,000 mg by mouth daily.     Cyanocobalamin (B-12 PO) Take 1 tablet by mouth daily.      ezetimibe (ZETIA) 10 MG tablet Take 1 tablet (10 mg total) by mouth daily. 30 tablet 11   ferrous sulfate 325 (65 FE) MG tablet Take 1 tablet (325 mg total) by mouth 2 (two) times daily with a meal. 60 tablet 0   fluticasone (FLONASE) 50 MCG/ACT nasal spray Place 2 sprays into both nostrils daily as needed for allergies or rhinitis. 16 g 6   levothyroxine (SYNTHROID) 25 MCG tablet TAKE 1 TABLET BY MOUTH ONCE DAILY BEFOREBREAKFAST 30 tablet 3   losartan (COZAAR) 25 MG tablet Take 0.5 tablets (12.5 mg total) by mouth daily. 90 tablet 3   magnesium oxide (MAG-OX) 400 MG tablet Take 1 tablet (400 mg total) by mouth daily. 30 tablet 2   metolazone (ZAROXOLYN) 2.5 MG tablet Take 1 tablet (2.5 mg total) by mouth daily. 60 tablet 2   ondansetron (ZOFRAN) 4 MG tablet Take 1 tablet (4 mg total)  by mouth every 8 (eight) hours as needed for nausea or vomiting. 20 tablet 1   pantoprazole (PROTONIX) 40 MG tablet Take 1 tablet (40 mg total) by mouth 2 (two) times daily before a meal. 60 tablet 0   potassium chloride SA (KLOR-CON M20) 20 MEQ tablet Take 2 tablets (40 mEq total) by mouth 2 (two) times daily. Take 1 extra tablet on the days you take metolazone. 360 tablet 3   pregabalin (LYRICA) 25 MG capsule Take 1 capsule (25 mg total) by mouth 2 (two) times daily. 60 capsule 0   rosuvastatin (CRESTOR) 40 MG tablet Take 1 tablet (40 mg total) by mouth daily. 90 tablet 3   torsemide (DEMADEX) 100 MG tablet Take 100 mg by mouth daily. 80 mg in the PM     vitamin C (ASCORBIC ACID) 250 MG tablet Take 500 mg by mouth daily.     No current facility-administered medications on file prior to visit.    Allergies  Allergen Reactions   Dilaudid [Hydromorphone] Other (See Comments)    Excessive Somnolence- Required Narcan   Fentanyl Other (See Comments)    Required Narcan when given with Dilaudid. Tolerated single dose when given during procedure 02/03/2019.    Codeine Nausea And Vomiting    Hallucinations    Morphine And  Related Nausea And Vomiting    Hallucinations    Whole Blood Itching and Nausea And Vomiting    Pt and family stated pt will receive blood products but had a reaction requiring benadryl on a previous administration.    Sulfonamide Derivatives Other (See Comments)    Dry mouth    Past Medical History:  Diagnosis Date   Actinic keratosis 01/17/2015   R forearm   Adjustment disorder with anxiety    Anemia    Anginal pain ([place])    Arthritis    "some in my hands" (11/11/2012)   Atrial fibrillation ([facility])    Atypical mole 03/25/2018   L forearm - severe   Automatic implantable cardioverter-defibrillator in situ    Blood in stool    Carotid artery stenosis 09/2007   a. 09/2007: 60-79% bilateral (stable); b. 10/2008: 40-59% R 60-79%    Chest pain, unspecified    Chronic airway  obstruction, not elsewhere classified    Chronic bronchitis ([place])    "get it some; not q year" (11/11/2012)   Chronic diastolic CHF (congestive heart failure) ([place]) 06/04/2013   Chronic kidney disease, unspecified    Coronary artery disease    non-obstructive by 2006 cath   Displaced fracture of left femoral neck ([place]) 10/09/2018   Frequent UTI    "get them a couple times/yr" (11/11/2012)   GI bleed 03/28/2020   Hemorrhage of rectum and anus    High cholesterol    History of blood transfusion 04/2011   "after hip OR" (11/11/2012)   Hx of renal cell cancer    Hypertension 05/20/2011   Hypertr obst cardiomyop    Hypotension, unspecified    cardiac cath 2006..nonobstructive CAD 30-40s lesions.Marland KitchenETT 1/09 nondiagnostic due to poor HR response..Right Renal Cancer 2003   Long term (current) use of anticoagulants    Malignant neoplasm of kidney, except pelvis    Osteoarthritis of right hip    Other and unspecified coagulation defects    PONV (postoperative nausea and vomiting)    Presence of permanent cardiac pacemaker    RECTAL BLEEDING 10/13/2009   Qualifier: Diagnosis of  By: Chester Holstein NP, Paula     Renal cancer [facility]) 06/2001   Right   Secondary cardiomyopathy, unspecified    Sinus bradycardia    Squamous cell carcinoma of skin 01/17/2015   R lat wrist   Squamous cell carcinoma of skin 01/29/2018   R post upper leg - superficially invasive   Squamous cell carcinoma of skin 03/25/2018   L lat foot   Squamous cell carcinoma of skin 11/09/2020   left lat foot - EDC 01/01/21, recurrent 01/31/21 - MOHs 02/22/21   Urge incontinence     Past Surgical History:  Procedure Laterality Date   ABDOMINAL AORTOGRAM W/LOWER EXTREMITY N/A 02/12/2021   Procedure: ABDOMINAL AORTOGRAM W/LOWER EXTREMITY;  Surgeon: Waynetta Sandy, MD;  Location: [place] [facility];  Service: Cardiovascular;  Laterality: N/A;   ABDOMINAL HYSTERECTOMY  1975   for benign causes   APPENDECTOMY     BI-VENTRICULAR  PACEMAKER UPGRADE  05/04/2010   BIOPSY  02/28/2019   Procedure: BIOPSY;  Surgeon: Milus Banister, MD;  Location: [facility] ENDOSCOPY;  Service: Endoscopy;;   CARDIAC CATHETERIZATION  2006   CARDIOVERSION N/A 02/20/2018   Procedure: CARDIOVERSION;  Surgeon: Minna Merritts, MD;  Location: [place] [facility];  Service: Cardiovascular;  Laterality: N/A;   CARDIOVERSION N/A 03/27/2018   Procedure: CARDIOVERSION;  Surgeon: Minna Merritts, MD;  Location: [facility];  Service:  Cardiovascular;  Laterality: N/A;   CATARACT EXTRACTION W/ INTRAOCULAR LENS  IMPLANT, BILATERAL  01/2006-02-2006   CHOLECYSTECTOMY N/A 11/11/2012   Procedure: LAPAROSCOPIC CHOLECYSTECTOMY WITH INTRAOPERATIVE CHOLANGIOGRAM;  Surgeon: Imogene Burn. Georgette Dover, MD;  Location: [place];  Service: General;  Laterality: N/A;   COLONOSCOPY WITH PROPOFOL N/A 02/28/2019   Procedure: COLONOSCOPY WITH PROPOFOL;  Surgeon: Milus Banister, MD;  Location: [facility] ENDOSCOPY;  Service: Endoscopy;  Laterality: N/A;   ENTEROSCOPY N/A 03/30/2020   Procedure: ENTEROSCOPY;  Surgeon: Lavena Bullion, DO;  Location: [place];  Service: Gastroenterology;  Laterality: N/A;   ENTEROSCOPY N/A 02/10/2021   Procedure: ENTEROSCOPY;  Surgeon: Carol Ada, MD;  Location: [place];  Service: Endoscopy;  Laterality: N/A;   EP IMPLANTABLE DEVICE N/A 02/21/2016   Procedure: ICD Generator Changeout;  Surgeon: Deboraha Sprang, MD;  Location: [place] [facility];  Service: Cardiovascular;  Laterality: N/A;   ESOPHAGOGASTRODUODENOSCOPY (EGD) WITH PROPOFOL N/A 02/28/2019   Procedure: ESOPHAGOGASTRODUODENOSCOPY (EGD) WITH PROPOFOL;  Surgeon: Milus Banister, MD;  Location: [facility] ENDOSCOPY;  Service: Endoscopy;  Laterality: N/A;   GIVENS CAPSULE STUDY N/A 03/15/2019   Procedure: GIVENS CAPSULE STUDY;  Surgeon: Thornton Park, MD;  Location: [place];  Service: Gastroenterology;  Laterality: N/A;   HOT HEMOSTASIS N/A 03/30/2020   Procedure: HOT HEMOSTASIS (ARGON PLASMA  COAGULATION/BICAP);  Surgeon: Lavena Bullion, DO;  Location: [facility] ENDOSCOPY;  Service: Gastroenterology;  Laterality: N/A;   HOT HEMOSTASIS N/A 02/10/2021   Procedure: HOT HEMOSTASIS (ARGON PLASMA COAGULATION/BICAP);  Surgeon: Carol Ada, MD;  Location: [place];  Service: Endoscopy;  Laterality: N/A;   INSERT / REPLACE / REMOVE PACEMAKER  05-01-11   02-28-05-/05-04-10-ICD-MEDTRONIC MAXIMAL DR   JOINT REPLACEMENT     LAPAROSCOPIC CHOLECYSTECTOMY  11/11/2012   LAPAROSCOPIC LYSIS OF ADHESIONS N/A 11/11/2012   Procedure: LAPAROSCOPIC LYSIS OF ADHESIONS;  Surgeon: Imogene Burn. Georgette Dover, MD;  Location: [place];  Service: General;  Laterality: N/A;   NEPHRECTOMY Right 06/2001    S/P RENAL CELL CANCER   PERIPHERAL VASCULAR INTERVENTION Bilateral 02/12/2021   Procedure: PERIPHERAL VASCULAR INTERVENTION;  Surgeon: Waynetta Sandy, MD;  Location: [place] [facility];  Service: Cardiovascular;  Laterality: Bilateral;  Iliac artery stents   PRESSURE SENSOR/CARDIOMEMS N/A 02/03/2019   Procedure: PRESSURE SENSOR/CARDIOMEMS;  Surgeon: Larey Dresser, MD;  Location: [place] [facility];  Service: Cardiovascular;  Laterality: N/A;   RIGHT HEART CATH N/A 11/09/2018   Procedure: RIGHT HEART CATH;  Surgeon: Larey Dresser, MD;  Location: [place] [facility];  Service: Cardiovascular;  Laterality: N/A;   RIGHT HEART CATH N/A 03/08/2019   Procedure: RIGHT HEART CATH;  Surgeon: Larey Dresser, MD;  Location: [place] [facility];  Service: Cardiovascular;  Laterality: N/A;   SUBMUCOSAL TATTOO INJECTION  02/28/2019   Procedure: SUBMUCOSAL TATTOO INJECTION;  Surgeon: Milus Banister, MD;  Location: [facility] ENDOSCOPY;  Service: Endoscopy;;   TOTAL HIP ARTHROPLASTY Right 05/03/2011   Procedure: TOTAL HIP ARTHROPLASTY ANTERIOR APPROACH;  Surgeon: Mcarthur Rossetti;  Location: [facility];  Service: Orthopedics;  Laterality: Right;  Removal of Cannulated Screws Right Hip, Right Direct Anterior Hip Replacement   TOTAL HIP  ARTHROPLASTY Left 10/09/2018   Procedure: TOTAL HIP ARTHROPLASTY ANTERIOR APPROACH;  Surgeon: Rod Can, MD;  Location: [place];  Service: Orthopedics;  Laterality: Left;    Family History  Problem Relation Age of Onset   Heart failure Mother    Early death Father        car accident  Breast cancer Maternal Aunt 82   Breast cancer Cousin    Breast cancer Other    Colon cancer Neg Hx     Social History   Socioeconomic History   Marital status: Widowed    Spouse name: Not on file   Number of children: 2   Years of education: high school   Highest education level: Not on file  Occupational History   Occupation: Retired    Fish farm manager: RETIRED  Tobacco Use   Smoking status: Former    Packs/day: 0.50    Years: 40.00    Pack years: 20.00    Types: Cigarettes    Quit date: 05/06/2001    Years since quitting: 20.0   Smokeless tobacco: Never  Vaping Use   Vaping Use: Never used  Substance and Sexual Activity   Alcohol use: No    Alcohol/week: 0.0 standard drinks   Drug use: No   Sexual activity: Not Currently  Other Topics Concern   Not on file  Social History Narrative   Would desire CPR   07/11/20   From: the area   Living: with great-grandson - [name] (2003)   Work: retired - clerical      Family: 2 children - [name] and [place] - 2 grand chlidren - 5 great grand children      Enjoys: spending time with friends - watch move, eat out, spend time      Exercise: walking around the house   Diet: not good, limits red meat, limits salt, low appetite      Safety   Seat belts: Yes    Guns: Yes  and secure   Safe in relationships: Yes    Social Determinants of Health   Financial Resource Strain: Not on file  Food Insecurity: Not on file  Transportation Needs: Not on file  Physical Activity: Not on file  Stress: Not on file  Social Connections: Not on file  Intimate Partner Violence: Not on file   Review of Systems Appetite is "so-so" Weight is holding Sleep is  variable---still some gout pain at night at times     Objective:   Physical Exam Constitutional:      Appearance: Normal appearance.  Cardiovascular:     Rate and Rhythm: Normal rate and regular rhythm.     Heart sounds:    No gallop.     Comments: Gr 2/6 systolic murmur Pulmonary:     Effort: Pulmonary effort is normal.     Breath sounds: Normal breath sounds. No wheezing or rales.  Abdominal:     Palpations: Abdomen is soft.     Tenderness: There is no abdominal tenderness.  Musculoskeletal:     Cervical back: Neck supple.     Right lower leg: No edema.     Left lower leg: No edema.  Neurological:     Mental Status: She is alert.           Assessment & Plan:

## 2021-06-04 NOTE — Assessment & Plan Note (Signed)
Likely from the iron Will check CBC to be sure

## 2021-06-04 NOTE — Assessment & Plan Note (Signed)
Hard to judge with her CHF and other medical issues Hopefully Hgb still good (last 11.2 which is good with CKD)

## 2021-06-05 LAB — CBC
HCT: 34.7 % — ABNORMAL LOW (ref 36.0–46.0)
Hemoglobin: 11 g/dL — ABNORMAL LOW (ref 12.0–15.0)
MCHC: 31.7 g/dL (ref 30.0–36.0)
MCV: 85.9 fl (ref 78.0–100.0)
Platelets: 172 10*3/uL (ref 150.0–400.0)
RBC: 4.05 Mil/uL (ref 3.87–5.11)
RDW: 17.1 % — ABNORMAL HIGH (ref 11.5–15.5)
WBC: 4.1 10*3/uL (ref 4.0–10.5)

## 2021-06-07 ENCOUNTER — Ambulatory Visit: Payer: PPO

## 2021-06-11 ENCOUNTER — Ambulatory Visit: Payer: PPO | Admitting: Pain Medicine

## 2021-06-11 ENCOUNTER — Other Ambulatory Visit (HOSPITAL_COMMUNITY): Payer: Self-pay | Admitting: Cardiology

## 2021-06-19 DIAGNOSIS — M6281 Muscle weakness (generalized): Secondary | ICD-10-CM | POA: Diagnosis not present

## 2021-06-19 DIAGNOSIS — I5033 Acute on chronic diastolic (congestive) heart failure: Secondary | ICD-10-CM | POA: Diagnosis not present

## 2021-06-19 DIAGNOSIS — Z4789 Encounter for other orthopedic aftercare: Secondary | ICD-10-CM | POA: Diagnosis not present

## 2021-06-19 DIAGNOSIS — R2689 Other abnormalities of gait and mobility: Secondary | ICD-10-CM | POA: Diagnosis not present

## 2021-06-19 DIAGNOSIS — J449 Chronic obstructive pulmonary disease, unspecified: Secondary | ICD-10-CM | POA: Diagnosis not present

## 2021-06-19 DIAGNOSIS — J90 Pleural effusion, not elsewhere classified: Secondary | ICD-10-CM | POA: Diagnosis not present

## 2021-06-25 ENCOUNTER — Ambulatory Visit: Payer: PPO | Admitting: Family Medicine

## 2021-06-25 ENCOUNTER — Encounter: Payer: Self-pay | Admitting: Oncology

## 2021-06-27 ENCOUNTER — Other Ambulatory Visit: Payer: Self-pay

## 2021-06-27 ENCOUNTER — Ambulatory Visit (HOSPITAL_COMMUNITY)
Admission: RE | Admit: 2021-06-27 | Discharge: 2021-06-27 | Disposition: A | Discharge status: Home / Self Care | Payer: PPO | Source: Ambulatory Visit | Attending: Family Medicine | Admitting: Family Medicine

## 2021-06-27 ENCOUNTER — Ambulatory Visit (HOSPITAL_BASED_OUTPATIENT_CLINIC_OR_DEPARTMENT_OTHER)
Admission: RE | Admit: 2021-06-27 | Discharge: 2021-06-27 | Disposition: A | Discharge status: Home / Self Care | Payer: PPO | Source: Ambulatory Visit | Attending: Cardiology | Admitting: Cardiology

## 2021-06-27 VITALS — BP 120/70 | HR 60 | Wt 144.8 lb

## 2021-06-27 DIAGNOSIS — J449 Chronic obstructive pulmonary disease, unspecified: Secondary | ICD-10-CM | POA: Diagnosis not present

## 2021-06-27 DIAGNOSIS — N183 Chronic kidney disease, stage 3 unspecified: Secondary | ICD-10-CM | POA: Insufficient documentation

## 2021-06-27 DIAGNOSIS — I4819 Other persistent atrial fibrillation: Secondary | ICD-10-CM | POA: Diagnosis not present

## 2021-06-27 DIAGNOSIS — Z7901 Long term (current) use of anticoagulants: Secondary | ICD-10-CM | POA: Insufficient documentation

## 2021-06-27 DIAGNOSIS — I5042 Chronic combined systolic (congestive) and diastolic (congestive) heart failure: Secondary | ICD-10-CM | POA: Diagnosis not present

## 2021-06-27 DIAGNOSIS — I428 Other cardiomyopathies: Secondary | ICD-10-CM | POA: Diagnosis not present

## 2021-06-27 DIAGNOSIS — Z87891 Personal history of nicotine dependence: Secondary | ICD-10-CM | POA: Diagnosis not present

## 2021-06-27 DIAGNOSIS — I272 Pulmonary hypertension, unspecified: Secondary | ICD-10-CM | POA: Insufficient documentation

## 2021-06-27 DIAGNOSIS — Z79899 Other long term (current) drug therapy: Secondary | ICD-10-CM | POA: Insufficient documentation

## 2021-06-27 DIAGNOSIS — I350 Nonrheumatic aortic (valve) stenosis: Secondary | ICD-10-CM

## 2021-06-27 DIAGNOSIS — Z09 Encounter for follow-up examination after completed treatment for conditions other than malignant neoplasm: Secondary | ICD-10-CM | POA: Diagnosis not present

## 2021-06-27 DIAGNOSIS — I080 Rheumatic disorders of both mitral and aortic valves: Secondary | ICD-10-CM | POA: Diagnosis not present

## 2021-06-27 DIAGNOSIS — D508 Other iron deficiency anemias: Secondary | ICD-10-CM | POA: Diagnosis not present

## 2021-06-27 DIAGNOSIS — I130 Hypertensive heart and chronic kidney disease with heart failure and stage 1 through stage 4 chronic kidney disease, or unspecified chronic kidney disease: Secondary | ICD-10-CM | POA: Insufficient documentation

## 2021-06-27 DIAGNOSIS — I4821 Permanent atrial fibrillation: Secondary | ICD-10-CM | POA: Insufficient documentation

## 2021-06-27 LAB — BASIC METABOLIC PANEL WITH GFR
Anion gap: 10 (ref 5–15)
BUN: 41 mg/dL — ABNORMAL HIGH (ref 8–23)
CO2: 32 mmol/L (ref 22–32)
Calcium: 9.5 mg/dL (ref 8.9–10.3)
Chloride: 95 mmol/L — ABNORMAL LOW (ref 98–111)
Creatinine, Ser: 1.37 mg/dL — ABNORMAL HIGH (ref 0.44–1.00)
GFR, Estimated: 38 mL/min — ABNORMAL LOW
Glucose, Bld: 94 mg/dL (ref 70–99)
Potassium: 3.5 mmol/L (ref 3.5–5.1)
Sodium: 137 mmol/L (ref 135–145)

## 2021-06-27 LAB — ECHOCARDIOGRAM COMPLETE
AR max vel: 1.31 cm2
AV Area VTI: 1.27 cm2
AV Area mean vel: 1.29 cm2
AV Mean grad: 14 mmHg
AV Peak grad: 24.8 mmHg
Ao pk vel: 2.49 m/s
MV VTI: 2.08 cm2
P 1/2 time: 310 msec
S' Lateral: 2 cm

## 2021-06-27 LAB — IRON AND TIBC
Iron: 68 ug/dL (ref 28–170)
Saturation Ratios: 17 % (ref 10.4–31.8)
TIBC: 399 ug/dL (ref 250–450)
UIBC: 331 ug/dL

## 2021-06-27 LAB — CBC
HCT: 37.3 % (ref 36.0–46.0)
Hemoglobin: 11.6 g/dL — ABNORMAL LOW (ref 12.0–15.0)
MCH: 27.7 pg (ref 26.0–34.0)
MCHC: 31.1 g/dL (ref 30.0–36.0)
MCV: 89 fL (ref 80.0–100.0)
Platelets: 182 10*3/uL (ref 150–400)
RBC: 4.19 MIL/uL (ref 3.87–5.11)
RDW: 15.9 % — ABNORMAL HIGH (ref 11.5–15.5)
WBC: 3.6 10*3/uL — ABNORMAL LOW (ref 4.0–10.5)
nRBC: 0 % (ref 0.0–0.2)

## 2021-06-27 LAB — FERRITIN: Ferritin: 25 ng/mL (ref 11–307)

## 2021-06-27 MED ORDER — METOLAZONE 2.5 MG PO TABS: 2.5000 mg | ORAL_TABLET | ORAL | 2 refills | Status: DC

## 2021-06-27 NOTE — Patient Instructions (Signed)
Medication Changes:  Take Metolazone on Thursday mornings only  Lab Work:  Labs done today, your results will be available in [place], we will contact you for abnormal readings.   Testing/Procedures:  none  Referrals:  none  Special Instructions // Education:  none  Follow-Up in: 6 weeks  At the [facility], you and your health needs are our priority. We have a designated team specialized in the treatment of Heart Failure. This Care Team includes your primary Heart Failure Specialized Cardiologist (physician), Advanced Practice Providers (APPs- Physician Assistants and Nurse Practitioners), and Pharmacist who all work together to provide you with the care you need, when you need it.   You may see any of the following providers on your designated Care Team at your next follow up:  Dr Glori Bickers Dr Haynes Kerns, NP Lyda Jester, [place] [facility] [place], [place] Audry Riles, PharmD   Please be sure to bring in all your medications bottles to every appointment.   Need to Contact us:  If you have any questions or concerns before your next appointment please send [place] a message through [place] or call our office at [phone].    TO LEAVE A MESSAGE FOR THE NURSE SELECT OPTION 2, PLEASE LEAVE A MESSAGE INCLUDING: YOUR NAME DATE OF BIRTH CALL BACK NUMBER REASON FOR CALL**this is important as we prioritize the call backs  YOU WILL RECEIVE A CALL BACK THE SAME DAY AS LONG AS YOU CALL BEFORE 4:00 PM

## 2021-06-27 NOTE — Progress Notes (Signed)
PCP: Lesleigh Noe, MD Cardiology: Dr. Rockey Situ HF Cardiology: Dr. [name]  88 y.o. with history of permanent atrial fibrillation, chronic diastolic CHF, CKD stage 3, smoking/prior COPD, renal cell CA s/p right nephrectomy.  She has a prior history of HFrEF with nonischemic cardiomyoapthy and had a Medtronic ICD placed.  However, subsequently her EF has recovered.  More recently, she has had symptomatic HFpEF.    She has had dyspnea and edema for "a long time," worse starting 1/20. In 6/20, she had left hip ORIF after mechanical fall.  Echo in 6/20 showed EF 60-65%, mild RV dilation with normal systolic function, PASP 93 mmHg.  After she got home from the hospital stay post-ORIF, she noted marked peripheral edema.  She was short of breath walking short distances.  +orthopnea.  She came back to the hospital on 11/04/18 and was admitted with volume overload.  She was diuresed aggressively and lost about 22 lbs.  RHC showed pulmonary venous hypertension.  PCWP tracing had prominent v-waves in absence of significant MR, suggesting significant diastolic dysfunction.   She has been noted to have significant Fe deficiency anemia, but FOBT was negative during 7/20 hospitalization.   She was admitted in 11/20 with symptomatic anemia, GI workup concerning for ischemic bowel.  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 Doren Custard with VVS and conservative management recommended.  Capsule endoscopy showed no definite source of bleeding.  She was volume overloaded due to significant RV dysfunction and diuresed, she also developed AKI on CKD stage 3.      In 10/22, she had bilateral CIA stents placed. ABIs in 11/22 showed patent stents.  She had a skin cancer removed from her left leg. 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   Echo was done today and reviewed, EF 65-70% with mild LVH, D-shaped septum with moderate RV enlargement, and mildly decreased RV function,  PASP 92, moderate-severe TR, dilated IVC, moderate aortic stenosis.   Today she returns for HF followup here with her niece. Still having dark stool though last hgb was 11.  She is not short of breath walking around her house or for short distances on flat ground.  Fatigues easily.  Generally tired.  No orthopnea/PND.  No chest pain.  No lightheadedness/falls. She has not been taking her metolazone regularly.   Medtronic device interrogation: Stable thoracic impedance.    Cardiomems PADP: 16, goal 17  Labs (7/20): K 3.7, creatinine 1.44 => 1.33 Labs (12/20): pro-BNP 431, K 4.4, creatinine 1.24, hgb 8.2 Labs (10/21): K 4, creatinine 1.83 Labs (12/21): K 3.5, creatinine 1.45 Labs (2/22): K 5.8 (Lokelma ordered), creatinine 1.39 Labs (3/22): K 4.4  Creatinine 1.42 Labs (8/22): K 4.2, creatinine 1.46, hgb 10.7 Labs (11/22): K 4.5, creatinine 1.69, LFTs normal Labs (1/23): hgb 11, K 4.2, creatinine 1.63, BNP 252  PMH: 1. COPD 2. Renal cell carcinoma: S/p right kidney resection.  3. CKD stage 3 4. Fe deficiency anemia 5. Atrial fibrillation: Permanent. She has failed amiodarone, Tikosyn, and flecainide as well as multiple cardioversions. 6. Chronic diastolic CHF: She has history of prior HFrEF with nonischemic cardiomyopathy.  She has a Medtronic ICD.   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  - More recently, EF has been in normal range but she has had diastolic CHF.  - Echo (1/95): EF 60-65%, mildly dilated RV with PASP 93 mmHg, mild-moderate TR.  - RHC (7/20): mean RA 11, PA 64/18 mean 39,  mean PCWP 24 with v waves to 41, CI 4.21, PVR 1.9 WU.  - Cardiomems placement - RHC (11/20): mean RA 13, PA 63/23 mean 25 with v waves to 47 (MR only mild by echo), mean PCWP 25, CI 3.18, PVR 2.6 WU - Echo (11/21): EF 65-70%, moderate LVH, D-shaped septum with mildly decreased RV systolic function, RVSP 12.4 mmHg, severe RAE, severe TR, mod-sev AS. - Echo (2/23): EF 65-70% with mild LVH, D-shaped  septum with moderate RV enlargement, and mildly decreased RV function, PASP 92, moderate-severe TR, dilated IVC, moderate aortic stenosis.  7. Hyperlipidemia 8. Carotid stenosis: Carotid dopplers (5/80) with 99-83% LICA stenosis.   9. Possible ischemic bowel in 11/20 10. 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 Conservative management per Dr. Doren Custard.  - 10/22 bilateral CIA stents.  - 11/22 ABIs: 1.07 left, 0.83 right with patent CIA stents.  11. 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  12. Squamous cell skin cancer.  13. Aortic stenosis: Moderate on 2/23 echo.   Social History   Socioeconomic History   Marital status: Widowed    Spouse name: Not on file   Number of children: 2   Years of education: high school   Highest education level: Not on file  Occupational History   Occupation: Retired    Fish farm manager: RETIRED  Tobacco Use   Smoking status: Former    Packs/day: 0.50    Years: 40.00    Pack years: 20.00    Types: Cigarettes    Quit date: 05/06/2001    Years since quitting: 20.1   Smokeless tobacco: Never  Vaping Use   Vaping Use: Never used  Substance and Sexual Activity   Alcohol use: No    Alcohol/week: 0.0 standard drinks   Drug use: No   Sexual activity: Not Currently  Other Topics Concern   Not on file  Social History Narrative   Would desire CPR   07/11/20   From: the area   Living: with great-grandson - [name] (2003)   Work: retired - clerical      Family: 2 children - [name] and Noble - 2 grand chlidren - 5 great grand children      Enjoys: spending time with friends - watch move, eat out, spend time      Exercise: walking around the house   Diet: not good, limits red meat, limits salt, low appetite      Safety   Seat belts: Yes    Guns: Yes  and secure   Safe in relationships: Yes    Social Determinants of Health   Financial Resource Strain: Not on file  Food Insecurity: Not on file   Transportation Needs: Not on file  Physical Activity: Not on file  Stress: Not on file  Social Connections: Not on file  Intimate Partner Violence: Not on file   Family History  Problem Relation Age of Onset   Heart failure Mother    Early death Father        car accident   Breast cancer Maternal Aunt 82   Breast cancer Cousin    Breast cancer Other    Colon cancer Neg Hx    ROS: All systems reviewed and negative except as per HPI.   Current Outpatient Medications  Medication Sig Dispense Refill   acetaminophen (TYLENOL) 500 MG tablet Take 500-1,000 mg by mouth every 8 (eight) hours as needed for mild pain or headache.  Cholecalciferol (VITAMIN D) 50 MCG (2000 UT) CAPS Take 4,000 Units by mouth daily.     Cranberry (AZO CRANBERRY GUMMIES) 500 MG CHEW Chew 2,000 mg by mouth daily.     Cyanocobalamin (B-12 PO) Take 1 tablet by mouth daily.      ELIQUIS 2.5 MG TABS tablet TAKE 1 TABLET BY MOUTH TWICE A DAY 60 tablet 11   ezetimibe (ZETIA) 10 MG tablet Take 1 tablet (10 mg total) by mouth daily. 30 tablet 11   ferrous sulfate 325 (65 FE) MG tablet Take 1 tablet (325 mg total) by mouth 2 (two) times daily with a meal. 60 tablet 0   fluticasone (FLONASE) 50 MCG/ACT nasal spray Place 2 sprays into both nostrils daily as needed for allergies or rhinitis. 16 g 6   levothyroxine (SYNTHROID) 25 MCG tablet TAKE 1 TABLET BY MOUTH ONCE DAILY BEFOREBREAKFAST 30 tablet 3   losartan (COZAAR) 25 MG tablet Take 0.5 tablets (12.5 mg total) by mouth daily. 90 tablet 3   magnesium oxide (MAG-OX) 400 MG tablet Take 1 tablet (400 mg total) by mouth daily. 30 tablet 2   ondansetron (ZOFRAN) 4 MG tablet Take 1 tablet (4 mg total) by mouth every 8 (eight) hours as needed for nausea or vomiting. 20 tablet 1   pantoprazole (PROTONIX) 40 MG tablet Take 1 tablet (40 mg total) by mouth 2 (two) times daily before a meal. 60 tablet 0   potassium chloride SA (KLOR-CON M20) 20 MEQ tablet Take 2 tablets (40 mEq  total) by mouth 2 (two) times daily. Take 1 extra tablet on the days you take metolazone. 360 tablet 3   rosuvastatin (CRESTOR) 40 MG tablet Take 1 tablet (40 mg total) by mouth daily. 90 tablet 3   torsemide (DEMADEX) 100 MG tablet Take 100 mg by mouth daily. 80 mg in the PM     vitamin C (ASCORBIC ACID) 250 MG tablet Take 500 mg by mouth daily.     metolazone (ZAROXOLYN) 2.5 MG tablet Take 1 tablet (2.5 mg total) by mouth as directed. Take every Thursday with your morning dose of Torsemide 60 tablet 2   No current facility-administered medications for this encounter.   Wt Readings from Last 3 Encounters:  06/27/21 65.7 kg (144 lb 12.8 oz)  06/04/21 65.3 kg (144 lb)  05/17/21 66.1 kg (145 lb 12.8 oz)   BP 120/70    Pulse 60    Wt 65.7 kg (144 lb 12.8 oz)    SpO2 98%    BMI 23.37 kg/m    PHYSICAL EXAM: General: NAD Neck: JVP 7-8 cm with HJR, no thyromegaly or thyroid nodule.  Lungs: Clear to auscultation bilaterally with normal respiratory effort. CV: Nondisplaced PMI.  Heart regular S1/S2, no W2/H8, 3/6 systolic murmur left sternal border and RUSB with clear S2.  Trace ankle edema.  No carotid bruit.  Unable to palpate pedal pulses.  Abdomen: Soft, nontender, no hepatosplenomegaly, no distention.  Skin: Intact without lesions or rashes.  Neurologic: Alert and oriented x 3.  Psych: Normal affect. Extremities: No clubbing or cyanosis.  HEENT: Normal.   Assessment/Plan: 1. Chronic diastolic CHF: Echo in 5/27 showed EF 60-65%, mild RV dilation with normal systolic function, PASP 93 mmHg.  She had remote nonischemic cardiomyopathy with recovery of EF, has Medtronic ICD.  I suspect that there is significant RV dysfunction. Atrial fibrillation likely contributes, but this appears to be permanent now as she has failed multiple anti-arrhythmics and is reasonably rate-controlled. [place] 11/20 showed  elevated filling pressures and pulmonary venous hypertension.  Prominent v-waves on PCWP tracing  likely due to stiff ventricle/diastolic dysfunction as she had only mild MR on echo. Echo (11/21) showed EF 65-70%, moderate LVH, RVSP 85.9 mmHg, severe RAE, severe TR, mod-sev AS. Echo today with EF 65-70% with mild LVH, D-shaped septum with moderate RV enlargement, and mildly decreased RV function, PASP 92, moderate-severe TR, dilated IVC, moderate aortic stenosis.  NYHA Class II-III symptoms, stable. Mild volume overload on exam but Cardiomems and Optivol look ok.  - Continue Torsemide 100 mg qam/ 80 mg qpm. - She needs to restart metolazone 2.5 once weekly every Thursday with am torsemide.  She will also take an extra KCl 20 with each metolazone dose.  BMET today and in 10 days.  2. Hypertension: BP controlled.   3. Pulmonary hypertension: PA systolic pressure estimated 92 mmHg on last echo. Based on [place] 11/20, she has primarily pulmonary venous hypertension, not candidate for pulmonary vasodilators.  4. CKD: Stage 3.  Last SCr 1.63.  - Consider SGLT2 inhibitor in the future.  - BMET today.  5. GI bleeding: Fe deficiency anemia.  Capsule endoscopy during 11/20 admission did not show bleeding source. Enteroscopy in 11/21 with two actively bleeding angioectasias treated with APC. She has had some dark stool recently.  - CBC today.  - Check Fe studies.  6. Atrial fibrillation: Permanent now.  Rate control is reasonable. She has failed amiodarone, Tikosyn, and flecainide as well as multiple cardioversions.  - Continue Eliquis. CBC today. 7. Aortic stenosis: Moderate on today's echo.   Followup in 6 wks with APP.   Loralie Champagne  06/27/2021

## 2021-06-27 NOTE — Progress Notes (Signed)
°  Echocardiogram 2D Echocardiogram has been performed.  [name] 06/27/2021, 9:08 AM

## 2021-06-28 LAB — BRAIN NATRIURETIC PEPTIDE: B Natriuretic Peptide: 187 pg/mL — ABNORMAL HIGH (ref 0.0–100.0)

## 2021-07-04 DIAGNOSIS — S91302A Unspecified open wound, left foot, initial encounter: Secondary | ICD-10-CM | POA: Diagnosis not present

## 2021-07-10 ENCOUNTER — Telehealth (HOSPITAL_COMMUNITY): Payer: Self-pay

## 2021-07-10 ENCOUNTER — Other Ambulatory Visit (HOSPITAL_COMMUNITY): Payer: Self-pay

## 2021-07-10 DIAGNOSIS — D508 Other iron deficiency anemias: Secondary | ICD-10-CM

## 2021-07-10 NOTE — Telephone Encounter (Signed)
Patient wants to know since she had a reaction to whole blood does she need to take anything prior to her getting her Feraheme infusion  ?

## 2021-07-10 NOTE — Telephone Encounter (Signed)
I do not think so, will check with pharmacist.  ?

## 2021-07-10 NOTE — Telephone Encounter (Signed)
There are no specific indications for premedications for Feraheme after having a reaction to whole blood. However, given that she did have a reaction, I think it would be reasonable to give diphenhydramine (Benadryl) as a premedication.

## 2021-07-10 NOTE — Telephone Encounter (Signed)
Tried calling patient, no answer,unable to leave message. Will try again later.   

## 2021-07-10 NOTE — Telephone Encounter (Signed)
Patient scheduled for an Feraheme infusion next Wednesday at 12pm. Can someone put the order in please,ordering physician is mclean  ?

## 2021-07-16 ENCOUNTER — Encounter: Payer: Self-pay | Admitting: Family Medicine

## 2021-07-16 ENCOUNTER — Other Ambulatory Visit: Payer: Self-pay

## 2021-07-16 ENCOUNTER — Ambulatory Visit (INDEPENDENT_AMBULATORY_CARE_PROVIDER_SITE_OTHER): Payer: PPO | Admitting: Family Medicine

## 2021-07-16 VITALS — BP 138/60 | HR 60 | Temp 97.5°F | Ht 66.0 in | Wt 150.1 lb

## 2021-07-16 DIAGNOSIS — C44729 Squamous cell carcinoma of skin of left lower limb, including hip: Secondary | ICD-10-CM

## 2021-07-16 DIAGNOSIS — I5032 Chronic diastolic (congestive) heart failure: Secondary | ICD-10-CM | POA: Diagnosis not present

## 2021-07-16 DIAGNOSIS — D649 Anemia, unspecified: Secondary | ICD-10-CM | POA: Diagnosis not present

## 2021-07-16 DIAGNOSIS — J439 Emphysema, unspecified: Secondary | ICD-10-CM | POA: Diagnosis not present

## 2021-07-16 NOTE — Progress Notes (Signed)
? ?Subjective:  ? ?  ?[name] is a 88 y.o. female presenting for Follow-up (4 mo) ?  ? ? ?HPI ? ?#Foot injury ?- cancer c/b infection and poor wound  ?- saw vascular ? ?#iron infusion ?- Wednesday is getting infusion ?- cardiology prescribed ?- hx of allergy to blood transfusion in the past ? ?Still having some dizziness ? ?Weight is up today - think her fluid is up ?- follows with Dr. [name]  ?- planning to take metolazone when she gets home ?- breathing well - has oxygen at home but has not needed this ?- did need to use O2 a few weeks ago because - no cold symptoms, notes feeling tired and used it over night and was better in the morning ? ?Review of Systems ? ? ?Social History  ? ?Tobacco Use  ?Smoking Status Former  ? Packs/day: 0.50  ? Years: 40.00  ? Pack years: 20.00  ? Types: Cigarettes  ? Quit date: 05/06/2001  ? Years since quitting: 20.2  ?Smokeless Tobacco Never  ? ? ? ?   ?Objective:  ?  ?BP Readings from Last 3 Encounters:  ?07/16/21 138/60  ?06/27/21 120/70  ?06/04/21 136/68  ? ?Wt Readings from Last 3 Encounters:  ?07/16/21 150 lb 1 oz (68.1 kg)  ?06/27/21 144 lb 12.8 oz (65.7 kg)  ?06/04/21 144 lb (65.3 kg)  ? ? ?BP 138/60   Pulse 60   Temp (!) 97.5 ?F (36.4 ?C) (Oral)   Ht '5\' 6"'$  (1.676 m)   Wt 150 lb 1 oz (68.1 kg)   SpO2 99%   BMI 24.22 kg/m?  ? ? ?Physical Exam ?Constitutional:   ?   General: She is not in acute distress. ?   Appearance: She is well-developed. She is not diaphoretic.  ?HENT:  ?   Right Ear: External ear normal.  ?   Left Ear: External ear normal.  ?   Nose: Nose normal.  ?Eyes:  ?   Conjunctiva/sclera: Conjunctivae normal.  ?Cardiovascular:  ?   Rate and Rhythm: Normal rate and regular rhythm.  ?   Heart sounds: Murmur heard.  ?Pulmonary:  ?   Effort: Pulmonary effort is normal. No respiratory distress.  ?   Breath sounds: Rales (faint scattered) present.  ?Musculoskeletal:  ?   Cervical back: Neck supple.  ?   Right lower leg: Edema present.  ?   Left lower leg:  Edema present.  ?Skin: ?   General: Skin is warm and dry.  ?   Capillary Refill: Capillary refill takes less than 2 seconds.  ?   Comments: Healing skin on the left foot  ?Neurological:  ?   Mental Status: She is alert. Mental status is at baseline.  ?Psychiatric:     ?   Mood and Affect: Mood normal.     ?   Behavior: Behavior normal.  ? ? ? ? ? ?   ?Assessment & Plan:  ? ?Problem List Items Addressed This Visit   ? ?  ? Cardiovascular and Mediastinum  ? Chronic diastolic CHF (congestive heart failure) ([place]) - Primary  ?  Weight is up and has edema on exam.  She has instructions to take metolazone on Mondays and Thursdays, did not take it this morning.  Advised taking today and if weight does not return back to normal to consider taking a second dose.  Update if weight is not returning to normal or worsening symptoms.  Follows with Dr. [name] appreciate cardiology's  advice.  Continue losartan 25 mg, torsemide 100 daily. ?  ?  ?  ? Respiratory  ? COPD (chronic obstructive pulmonary disease) ([place])  ?  Stable, has home oxygen for as needed use did use it recently but not needing it today.  Lungs clear. ?  ?  ?  ? Musculoskeletal and Integument  ? Squamous cell carcinoma of foot, left  ?  Status post Mohs.  Complicated by infection and PAD.  Skin looks like it is healing appropriately today appreciate support from vascular and dermatology. ?  ?  ?  ? Other  ? Symptomatic anemia  ?  Continues to have intermittent dizziness which does resolve on its own, she is scheduled for iron infusion.  Discussed likely to have a reaction like she did to blood transfusion as she has tolerated these in the past.  Appreciate cardiology support follow-up if dizziness not improved ?  ?  ? ?I spent >25 minutes with pt , obtaining history, examining, reviewing chart, documenting encounter and discussing the above plan of care. ? ? ?Return in about 6 months (around 01/16/2022) for wellness visi. ? ?Lesleigh Noe, MD ? ?This visit occurred  during the SARS-CoV-2 public health emergency.  Safety protocols were in place, including screening questions prior to the visit, additional usage of staff PPE, and extensive cleaning of exam room while observing appropriate contact time as indicated for disinfecting solutions.  ? ?

## 2021-07-16 NOTE — Assessment & Plan Note (Signed)
Weight is up and has edema on exam.  She has instructions to take metolazone on Mondays and Thursdays, did not take it this morning.  Advised taking today and if weight does not return back to normal to consider taking a second dose.  Update if weight is not returning to normal or worsening symptoms.  Follows with Dr. [name] appreciate cardiology's advice.  Continue losartan 25 mg, torsemide 100 daily. ?

## 2021-07-16 NOTE — Assessment & Plan Note (Signed)
Status post Mohs.  Complicated by infection and PAD.  Skin looks like it is healing appropriately today appreciate support from vascular and dermatology. ?

## 2021-07-16 NOTE — Assessment & Plan Note (Signed)
Stable, has home oxygen for as needed use did use it recently but not needing it today.  Lungs clear. ?

## 2021-07-16 NOTE — Assessment & Plan Note (Signed)
Continues to have intermittent dizziness which does resolve on its own, she is scheduled for iron infusion.  Discussed likely to have a reaction like she did to blood transfusion as she has tolerated these in the past.  Appreciate cardiology support follow-up if dizziness not improved ?

## 2021-07-18 ENCOUNTER — Ambulatory Visit (HOSPITAL_COMMUNITY)
Admission: RE | Admit: 2021-07-18 | Discharge: 2021-07-18 | Disposition: A | Discharge status: Home / Self Care | Payer: PPO | Source: Ambulatory Visit | Attending: Cardiology | Admitting: Cardiology

## 2021-07-18 ENCOUNTER — Other Ambulatory Visit: Payer: Self-pay

## 2021-07-18 DIAGNOSIS — D508 Other iron deficiency anemias: Secondary | ICD-10-CM | POA: Insufficient documentation

## 2021-07-18 MED ORDER — SODIUM CHLORIDE 0.9 % IV SOLN
510.0000 mg | Freq: Once | INTRAVENOUS | Status: AC
  Administered 2021-07-18: 510 mg via INTRAVENOUS
  Filled 2021-07-18: qty 17

## 2021-08-02 ENCOUNTER — Ambulatory Visit: Payer: PPO

## 2021-08-06 NOTE — Progress Notes (Signed)
PCP: Lesleigh Noe, MD ?Cardiology: Dr. Rockey Situ ?HF Cardiology: Dr. [name] ? ?88 y.o. with history of permanent atrial fibrillation, chronic diastolic CHF, CKD stage 3, smoking/prior COPD, renal cell CA s/p right nephrectomy.  She has a prior history of HFrEF with nonischemic cardiomyoapthy and had a Medtronic ICD placed.  However, subsequently her EF has recovered.  More recently, she has had symptomatic HFpEF.   ? ?She has had dyspnea and edema for "a long time," worse starting 1/20. In 6/20, she had left hip ORIF after mechanical fall.  Echo in 6/20 showed EF 60-65%, mild RV dilation with normal systolic function, PASP 93 mmHg.  After she got home from the hospital stay post-ORIF, she noted marked peripheral edema.  She was short of breath walking short distances.  +orthopnea.  She came back to the hospital on 11/04/18 and was admitted with volume overload.  She was diuresed aggressively and lost about 22 lbs.  RHC showed pulmonary venous hypertension.  PCWP tracing had prominent v-waves in absence of significant MR, suggesting significant diastolic dysfunction.  ? ?She has been noted to have significant Fe deficiency anemia, but FOBT was negative during 7/20 hospitalization.  ? ?She was admitted in 11/20 with symptomatic anemia, GI workup concerning for ischemic bowel.  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 Doren Custard with VVS and conservative management recommended.  Capsule endoscopy showed no definite source of bleeding.  She was volume overloaded due to significant RV dysfunction and diuresed, she also developed AKI on CKD stage 3.     ? ?In 10/22, she had bilateral CIA stents placed. ABIs in 11/22 showed patent stents.  She had a skin cancer removed from her left leg. 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  ? ?Echo 2/23 EF 65-70% with mild LVH, D-shaped septum with moderate RV enlargement, and mildly decreased RV function, PASP 92, moderate-severe  TR, dilated IVC, moderate aortic stenosis.  ? ?Follow up 2/23 she remained fatigued and had not been taking metolazone regularly. Volume elevated and instructed to resume metolazone weekly. ? ?Today she returns for HF follow up with her niece. Overall feeling poor. Felt light-headed and weak after taking metolazone dose last week. She has point tenderness under left breast/rib cage and chronic back pain. Occasional palpitations. She has mild dyspnea with walking further distances on flat ground. Denies edema or PND/Orthopnea. Noticed dark stools recently. Appetite fair, drinking a lot of fluid. No fever or chills. Weight at home 145 pounds. Taking all medications.  ? ?Medtronic device interrogation: OptiVol down, thoracic impedence elevated, no VT, <1 hr/day activity (personally reviewed). ? ?Cardiomems PADP: 13, goal 17 ? ?Labs (7/20): K 3.7, creatinine 1.44 => 1.33 ?Labs (12/20): pro-BNP 431, K 4.4, creatinine 1.24, hgb 8.2 ?Labs (10/21): K 4, creatinine 1.83 ?Labs (12/21): K 3.5, creatinine 1.45 ?Labs (2/22): K 5.8 (Lokelma ordered), creatinine 1.39 ?Labs (3/22): K 4.4  Creatinine 1.42 ?Labs (8/22): K 4.2, creatinine 1.46, hgb 10.7 ?Labs (11/22): K 4.5, creatinine 1.69, LFTs normal ?Labs (1/23): hgb 11, K 4.2, creatinine 1.63, BNP 252 ?Labs (2/23): K 3.5, creatinine 1.37 ? ?PMH: ?1. COPD ?2. Renal cell carcinoma: S/p right kidney resection.  ?3. CKD stage 3 ?4. Fe deficiency anemia ?5. Atrial fibrillation: Permanent. She has failed amiodarone, Tikosyn, and flecainide as well as multiple cardioversions. ?6. Chronic diastolic CHF: She has history of prior HFrEF with nonischemic cardiomyopathy.  She has a Medtronic ICD.   ?- LHC in 2006  showed nonobstructive CAD.  ?- More recently, EF has been in normal range but she has had diastolic CHF.  ?- Echo (6/20): EF 60-65%, mildly dilated RV with PASP 93 mmHg, mild-moderate TR.  ?- RHC (7/20): mean RA 11, PA 64/18 mean 39, mean PCWP 24 with v waves to 41, CI 4.21, PVR 1.9  WU.  ?- Cardiomems placement ?- RHC (11/20): mean RA 13, PA 63/23 mean 25 with v waves to 47 (MR only mild by echo), mean PCWP 25, CI 3.18, PVR 2.6 WU ?- Echo (11/21): EF 65-70%, moderate LVH, D-shaped septum with mildly decreased RV systolic function, RVSP 54.0 mmHg, severe RAE, severe TR, mod-sev AS. ?- Echo (2/23): EF 65-70% with mild LVH, D-shaped septum with moderate RV enlargement, and mildly decreased RV function, PASP 92, moderate-severe TR, dilated IVC, moderate aortic stenosis.  ?7. Hyperlipidemia ?8. Carotid stenosis: Carotid dopplers (0/86) with 76-19% LICA stenosis.   ?9. Possible ischemic bowel in 11/20 ?10. 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 Conservative management per Dr. Doren Custard.  ?- 10/22 bilateral CIA stents.  ?- 11/22 ABIs: 1.07 left, 0.83 right with patent CIA stents.  ?11. 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  ?12. Squamous cell skin cancer.  ?13. Aortic stenosis: Moderate on 2/23 echo.  ? ?Social History  ? ?Socioeconomic History  ? Marital status: Widowed  ?  Spouse name: Not on file  ? Number of children: 2  ? Years of education: high school  ? Highest education level: Not on file  ?Occupational History  ? Occupation: Retired  ?  Employer: RETIRED  ?Tobacco Use  ? Smoking status: Former  ?  Packs/day: 0.50  ?  Years: 40.00  ?  Pack years: 20.00  ?  Types: Cigarettes  ?  Quit date: 05/06/2001  ?  Years since quitting: 20.2  ? Smokeless tobacco: Never  ?Vaping Use  ? Vaping Use: Never used  ?Substance and Sexual Activity  ? Alcohol use: No  ?  Alcohol/week: 0.0 standard drinks  ? Drug use: No  ? Sexual activity: Not Currently  ?Other Topics Concern  ? Not on file  ?Social History Narrative  ? Would desire CPR  ? 07/11/20  ? From: the area  ? Living: with [name] (2003)  ? Work: retired - clerical  ?   ? Family: 2 children - [name] and [name] - 2 grand chlidren - 5 great grand children  ?   ? Enjoys: spending time  with friends - watch move, eat out, spend time  ?   ? Exercise: walking around the house  ? Diet: not good, limits red meat, limits salt, low appetite  ?   ? Safety  ? Seat belts: Yes   ? Guns: Yes  and secure  ? Safe in relationships: Yes   ? ?Social Determinants of Health  ? ?Financial Resource Strain: Not on file  ?Food Insecurity: Not on file  ?Transportation Needs: Not on file  ?Physical Activity: Not on file  ?Stress: Not on file  ?Social Connections: Not on file  ?Intimate Partner Violence: Not on file  ? ?Family History  ?Problem Relation Age of Onset  ? Heart failure Mother   ? Early death Father   ?     car accident  ? Breast cancer Maternal Aunt 75  ? Breast cancer Cousin   ? Breast cancer Other   ? Colon cancer Neg Hx   ? ?ROS:  All systems reviewed and negative except as per HPI.  ? ?Current Outpatient Medications  ?Medication Sig Dispense Refill  ? acetaminophen (TYLENOL) 500 MG tablet Take 500-1,000 mg by mouth every 8 (eight) hours as needed for mild pain or headache.     ? Cholecalciferol (VITAMIN D) 50 MCG (2000 UT) CAPS Take 4,000 Units by mouth daily.    ? Cranberry (AZO CRANBERRY GUMMIES) 500 MG CHEW Chew 2,000 mg by mouth daily.    ? Cyanocobalamin (B-12 PO) Take 1 tablet by mouth daily.     ? ELIQUIS 2.5 MG TABS tablet TAKE 1 TABLET BY MOUTH TWICE A DAY 60 tablet 11  ? ezetimibe (ZETIA) 10 MG tablet Take 1 tablet (10 mg total) by mouth daily. 30 tablet 11  ? ferrous sulfate 325 (65 FE) MG tablet Take 1 tablet (325 mg total) by mouth 2 (two) times daily with a meal. 60 tablet 0  ? fluticasone (FLONASE) 50 MCG/ACT nasal spray Place 2 sprays into both nostrils daily as needed for allergies or rhinitis. 16 g 6  ? levothyroxine (SYNTHROID) 25 MCG tablet TAKE 1 TABLET BY MOUTH ONCE DAILY BEFOREBREAKFAST 30 tablet 3  ? losartan (COZAAR) 25 MG tablet Take 0.5 tablets (12.5 mg total) by mouth daily. 90 tablet 3  ? magnesium oxide (MAG-OX) 400 MG tablet Take 1 tablet (400 mg total) by mouth daily. 30  tablet 2  ? metolazone (ZAROXOLYN) 2.5 MG tablet Take 1 tablet (2.5 mg total) by mouth as directed. Take every Thursday with your morning dose of Torsemide 60 tablet 2  ? ondansetron (ZOFRAN) 4 MG tablet

## 2021-08-08 ENCOUNTER — Ambulatory Visit (HOSPITAL_COMMUNITY)
Admission: RE | Admit: 2021-08-08 | Discharge: 2021-08-08 | Disposition: A | Discharge status: Home / Self Care | Payer: HMO | Source: Ambulatory Visit | Attending: Family Medicine | Admitting: Family Medicine

## 2021-08-08 ENCOUNTER — Other Ambulatory Visit (HOSPITAL_COMMUNITY): Payer: Self-pay

## 2021-08-08 ENCOUNTER — Encounter (HOSPITAL_COMMUNITY): Payer: Self-pay

## 2021-08-08 ENCOUNTER — Encounter: Payer: Self-pay | Admitting: Oncology

## 2021-08-08 VITALS — BP 150/60 | HR 61 | Wt 144.0 lb

## 2021-08-08 DIAGNOSIS — I272 Pulmonary hypertension, unspecified: Secondary | ICD-10-CM | POA: Diagnosis not present

## 2021-08-08 DIAGNOSIS — I10 Essential (primary) hypertension: Secondary | ICD-10-CM

## 2021-08-08 DIAGNOSIS — Z8719 Personal history of other diseases of the digestive system: Secondary | ICD-10-CM

## 2021-08-08 DIAGNOSIS — I350 Nonrheumatic aortic (valve) stenosis: Secondary | ICD-10-CM | POA: Diagnosis not present

## 2021-08-08 DIAGNOSIS — Z09 Encounter for follow-up examination after completed treatment for conditions other than malignant neoplasm: Secondary | ICD-10-CM | POA: Diagnosis not present

## 2021-08-08 DIAGNOSIS — I428 Other cardiomyopathies: Secondary | ICD-10-CM | POA: Diagnosis not present

## 2021-08-08 DIAGNOSIS — R002 Palpitations: Secondary | ICD-10-CM | POA: Insufficient documentation

## 2021-08-08 DIAGNOSIS — D631 Anemia in chronic kidney disease: Secondary | ICD-10-CM | POA: Insufficient documentation

## 2021-08-08 DIAGNOSIS — N183 Chronic kidney disease, stage 3 unspecified: Secondary | ICD-10-CM | POA: Diagnosis not present

## 2021-08-08 DIAGNOSIS — I4819 Other persistent atrial fibrillation: Secondary | ICD-10-CM | POA: Diagnosis not present

## 2021-08-08 DIAGNOSIS — Z87891 Personal history of nicotine dependence: Secondary | ICD-10-CM | POA: Insufficient documentation

## 2021-08-08 DIAGNOSIS — I5032 Chronic diastolic (congestive) heart failure: Secondary | ICD-10-CM | POA: Insufficient documentation

## 2021-08-08 DIAGNOSIS — I4821 Permanent atrial fibrillation: Secondary | ICD-10-CM | POA: Insufficient documentation

## 2021-08-08 DIAGNOSIS — Z7901 Long term (current) use of anticoagulants: Secondary | ICD-10-CM | POA: Insufficient documentation

## 2021-08-08 DIAGNOSIS — R0600 Dyspnea, unspecified: Secondary | ICD-10-CM | POA: Insufficient documentation

## 2021-08-08 DIAGNOSIS — J449 Chronic obstructive pulmonary disease, unspecified: Secondary | ICD-10-CM | POA: Insufficient documentation

## 2021-08-08 DIAGNOSIS — I130 Hypertensive heart and chronic kidney disease with heart failure and stage 1 through stage 4 chronic kidney disease, or unspecified chronic kidney disease: Secondary | ICD-10-CM | POA: Diagnosis not present

## 2021-08-08 DIAGNOSIS — Z79899 Other long term (current) drug therapy: Secondary | ICD-10-CM | POA: Insufficient documentation

## 2021-08-08 LAB — CBC
HCT: 39.6 % (ref 36.0–46.0)
Hemoglobin: 12.9 g/dL (ref 12.0–15.0)
MCH: 29.3 pg (ref 26.0–34.0)
MCHC: 32.6 g/dL (ref 30.0–36.0)
MCV: 90 fL (ref 80.0–100.0)
Platelets: 188 10*3/uL (ref 150–400)
RBC: 4.4 MIL/uL (ref 3.87–5.11)
RDW: 14.6 % (ref 11.5–15.5)
WBC: 3.9 10*3/uL — ABNORMAL LOW (ref 4.0–10.5)
nRBC: 0 % (ref 0.0–0.2)

## 2021-08-08 LAB — BASIC METABOLIC PANEL
Anion gap: 9 (ref 5–15)
BUN: 42 mg/dL — ABNORMAL HIGH (ref 8–23)
CO2: 33 mmol/L — ABNORMAL HIGH (ref 22–32)
Calcium: 9.8 mg/dL (ref 8.9–10.3)
Chloride: 97 mmol/L — ABNORMAL LOW (ref 98–111)
Creatinine, Ser: 1.73 mg/dL — ABNORMAL HIGH (ref 0.44–1.00)
GFR, Estimated: 29 mL/min — ABNORMAL LOW (ref >60)
Glucose, Bld: 82 mg/dL (ref 70–99)
Potassium: 3.9 mmol/L (ref 3.5–5.1)
Sodium: 139 mmol/L (ref 135–145)

## 2021-08-08 LAB — BRAIN NATRIURETIC PEPTIDE: B Natriuretic Peptide: 210.7 pg/mL — ABNORMAL HIGH (ref 0.0–100.0)

## 2021-08-08 MED ORDER — METOLAZONE 2.5 MG PO TABS: 2.5000 mg | ORAL_TABLET | ORAL | 2 refills | Status: DC

## 2021-08-08 NOTE — Patient Instructions (Signed)
CHANGE Metolazone to 2.5 mg one tab every other week on Thursday  ? ? ?Labs today ?We will only contact you if something comes back abnormal or we need to make some changes. ?Otherwise no news is good news! ? ?Your physician recommends that you schedule a follow-up appointment in: 4-6 weeks  in the Advanced Practitioners (PA/NP) Clinic  ? ?Your physician wants you to follow-up in: 4 months with Dr [name]. You will receive a reminder letter in the mail two months in advance. If you don't receive a letter, please call our office to schedule the follow-up appointment. ? ? ?Do the following things EVERYDAY: ?Weigh yourself in the morning before breakfast. Write it down and keep it in a log. ?Take your medicines as prescribed ?Eat low salt foods--Limit salt (sodium) to 2000 mg per day.  ?Stay as active as you can everyday ?Limit all fluids for the day to less than 2 liters ? ?At the [facility], you and your health needs are our priority. As part of our continuing mission to provide you with exceptional heart care, we have created designated Provider Care Teams. These Care Teams include your primary Cardiologist (physician) and Advanced Practice Providers (APPs- Physician Assistants and Nurse Practitioners) who all work together to provide you with the care you need, when you need it.  ? ?You may see any of the following providers on your designated Care Team at your next follow up: ?Dr Glori Bickers ?Dr Loralie Champagne ?Darrick Grinder, NP ?Lyda Jester, PA ?Jessica Milford,NP ?Marlyce Huge, PA ?Audry Riles, PharmD ? ? ?Please be sure to bring in all your medications bottles to every appointment.  ? ?If you have any questions or concerns before your next appointment please send [place] a message through [place] or call our office at [phone].   ? ?TO LEAVE A MESSAGE FOR THE NURSE SELECT OPTION 2, PLEASE LEAVE A MESSAGE INCLUDING: ?YOUR NAME ?DATE OF BIRTH ?CALL BACK NUMBER ?REASON FOR CALL**this is  important as we prioritize the call backs ? ?YOU WILL RECEIVE A CALL BACK THE SAME DAY AS LONG AS YOU CALL BEFORE 4:00 PM ? ? ?

## 2021-08-16 ENCOUNTER — Ambulatory Visit (INDEPENDENT_AMBULATORY_CARE_PROVIDER_SITE_OTHER): Payer: PPO | Admitting: Family Medicine

## 2021-08-16 VITALS — BP 110/60 | HR 67 | Temp 97.5°F | Wt 146.6 lb

## 2021-08-16 DIAGNOSIS — R195 Other fecal abnormalities: Secondary | ICD-10-CM | POA: Diagnosis not present

## 2021-08-16 DIAGNOSIS — D508 Other iron deficiency anemias: Secondary | ICD-10-CM

## 2021-08-16 DIAGNOSIS — R42 Dizziness and giddiness: Secondary | ICD-10-CM | POA: Diagnosis not present

## 2021-08-16 DIAGNOSIS — I10 Essential (primary) hypertension: Secondary | ICD-10-CM | POA: Diagnosis not present

## 2021-08-16 LAB — CBC
HCT: 35.8 % — ABNORMAL LOW (ref 36.0–46.0)
Hemoglobin: 11.6 g/dL — ABNORMAL LOW (ref 12.0–15.0)
MCHC: 32.5 g/dL (ref 30.0–36.0)
MCV: 89.2 fl (ref 78.0–100.0)
Platelets: 162 10*3/uL (ref 150.0–400.0)
RBC: 4.01 Mil/uL (ref 3.87–5.11)
RDW: 15.7 % — ABNORMAL HIGH (ref 11.5–15.5)
WBC: 3.6 10*3/uL — ABNORMAL LOW (ref 4.0–10.5)

## 2021-08-16 LAB — FERRITIN: Ferritin: 150.9 ng/mL (ref 10.0–291.0)

## 2021-08-16 NOTE — Patient Instructions (Addendum)
Dizziness ?- we will recheck the iron level and blood counts ?- continue to check your blood pressure at home ?- continue to wear compression socks ?- slow transitions ? ?If blood pressure at home <110/60 regularly - call cardiology or me ?

## 2021-08-16 NOTE — Assessment & Plan Note (Signed)
With history of GI bleed.  She does note some dark stools recently as well as worsening dizziness over the last week.  She did have normal CBC last week however symptoms had only just started at that time.  We will repeat CBC as well as iron studies to evaluate. ?

## 2021-08-16 NOTE — Assessment & Plan Note (Signed)
We will evaluate for anemia given her history.  Treatment complicated by CHF and needing to be on fluid restriction.  Encouraged her to drink as tolerated.  Her metolazone was recently decreased to once per week she has not noticed a difference with this, however she does have some fluid on her legs.  Discussed importance of wearing compression socks and slowing transitions. ?

## 2021-08-16 NOTE — Progress Notes (Signed)
? ?Subjective:  ? ?  ?[name] is a 87 y.o. female presenting for Dizziness (X 1.5 week. Worse when she is moving ) ?  ? ? ?HPI ? ?#Dizziness ?- worse with movement ?- 1.5 weeks ?- had normal Hgb last week ?- was feeling lethargic 1 month ago ?- had an iron infusion last month and felt very good with this ?- noticed improvement for 3-4 weeks ?- Monday 08/06/2021 - did blood work but no iron level ?- is having occasional dark stools ?- is taking oral iron supplement ?- worse when walking ?- but constant ?- if she moves her eyes she gets dizzy ?- no vertigo or room spinning ?- hydration - lots of water ? ?CHF ?- saw cardiology 4/5 ?- metolazone changed to 2.5 mg every other Thursday ?- no change in dizziness after that visit ? ?Review of Systems ? ? ?Social History  ? ?Tobacco Use  ?Smoking Status Former  ? Packs/day: 0.50  ? Years: 40.00  ? Pack years: 20.00  ? Types: Cigarettes  ? Quit date: 05/06/2001  ? Years since quitting: 20.2  ?Smokeless Tobacco Never  ? ? ? ?   ?Objective:  ?  ?BP Readings from Last 3 Encounters:  ?08/16/21 110/60  ?08/08/21 (!) 150/60  ?07/18/21 (!) 148/60  ? ?Wt Readings from Last 3 Encounters:  ?08/16/21 146 lb 9 oz (66.5 kg)  ?08/08/21 144 lb (65.3 kg)  ?07/18/21 145 lb (65.8 kg)  ? ? ?BP 110/60   Pulse 67   Temp (!) 97.5 ?F (36.4 ?C) (Oral)   Wt 146 lb 9 oz (66.5 kg)   SpO2 99%   BMI 23.66 kg/m?  ? ? ?Physical Exam ?Constitutional:   ?   General: She is not in acute distress. ?   Appearance: She is well-developed. She is not diaphoretic.  ?HENT:  ?   Head: Normocephalic and atraumatic.  ?   Right Ear: External ear normal.  ?   Left Ear: External ear normal.  ?   Nose: Nose normal.  ?Eyes:  ?   Comments: B/l conjunctival pallor  ?Cardiovascular:  ?   Rate and Rhythm: Normal rate and regular rhythm.  ?   Heart sounds: Murmur heard.  ?Pulmonary:  ?   Effort: Pulmonary effort is normal. No respiratory distress.  ?   Breath sounds: Normal breath sounds. No wheezing.   ?Musculoskeletal:  ?   Cervical back: Neck supple.  ?Skin: ?   General: Skin is warm and dry.  ?   Capillary Refill: Capillary refill takes less than 2 seconds.  ?Neurological:  ?   Mental Status: She is alert. Mental status is at baseline.  ?Psychiatric:     ?   Mood and Affect: Mood normal.     ?   Behavior: Behavior normal.  ? ? ? ? ? ?   ?Assessment & Plan:  ? ?Problem List Items Addressed This Visit   ? ?  ? Cardiovascular and Mediastinum  ? Hypertension  ?  Blood pressure low normal today.  She takes losartan 12.5 mg, continue this medication.  Advised home monitoring and to update myself and cardiology if blood pressure persistently low in setting of dizziness. ?  ?  ?  ? Other  ? Intermittent lightheadedness  ?  We will evaluate for anemia given her history.  Treatment complicated by CHF and needing to be on fluid restriction.  Encouraged her to drink as tolerated.  Her metolazone was recently decreased to once  per week she has not noticed a difference with this, however she does have some fluid on her legs.  Discussed importance of wearing compression socks and slowing transitions. ?  ?  ? Iron deficiency anemia - Primary  ?  With history of GI bleed.  She does note some dark stools recently as well as worsening dizziness over the last week.  She did have normal CBC last week however symptoms had only just started at that time.  We will repeat CBC as well as iron studies to evaluate. ?  ?  ? Relevant Orders  ? CBC  ? Ferritin  ? Iron, Total/Total Iron Binding Cap  ? Dark stools  ? Relevant Orders  ? CBC  ? Ferritin  ? Iron, Total/Total Iron Binding Cap  ? ? ? ?Return if symptoms worsen or fail to improve. ? ?Lesleigh Noe, MD ? ? ? ?

## 2021-08-16 NOTE — Assessment & Plan Note (Signed)
Blood pressure low normal today.  She takes losartan 12.5 mg, continue this medication.  Advised home monitoring and to update myself and cardiology if blood pressure persistently low in setting of dizziness. ?

## 2021-08-17 LAB — IRON, TOTAL/TOTAL IRON BINDING CAP
%SAT: 21 % (calc) (ref 16–45)
Iron: 56 ug/dL (ref 45–160)
TIBC: 267 mcg/dL (calc) (ref 250–450)

## 2021-08-20 ENCOUNTER — Telehealth: Payer: Self-pay

## 2021-08-20 ENCOUNTER — Telehealth: Payer: Self-pay | Admitting: Physician Assistant

## 2021-08-20 DIAGNOSIS — D649 Anemia, unspecified: Secondary | ICD-10-CM

## 2021-08-20 DIAGNOSIS — D508 Other iron deficiency anemias: Secondary | ICD-10-CM

## 2021-08-20 NOTE — Telephone Encounter (Signed)
Referral placed.

## 2021-08-20 NOTE — Telephone Encounter (Signed)
Scheduled appt per 4/17 referral. Pt is aware of appt date and time. Pt is aware to arrive 15 mins prior to appt time and to bring and updated insurance card. Pt is aware of appt location.   ?

## 2021-08-20 NOTE — Telephone Encounter (Signed)
Patient states she would like to proceed with referral to hematologist and would like [place] location. ? ?Patient was advised that once referral is placed it will go to the [facility] Hematology office and their office will reach out to the patient to schedule. ?

## 2021-08-24 ENCOUNTER — Other Ambulatory Visit: Payer: Self-pay

## 2021-08-24 DIAGNOSIS — D508 Other iron deficiency anemias: Secondary | ICD-10-CM

## 2021-08-26 NOTE — Progress Notes (Signed)
? ? [place] ?[phone]   [phone] ? ?CONSULT NOTE ? ?REFERRING PHYSICIAN: Dr. Einar Pheasant MD ? ?REASON FOR CONSULTATION:  ?Iron Deficiency Anemia ? ?HPI ?[name] is a 88 y.o. female with a past medical history significant for cardiomyopathy, permanent atrial fibrillation on Eliquis, hypertension, congestive heart failure, TIA, mitral valve regurgitation, peripheral artery disease, COPD, GI bleeding, chronic kidney disease, hyperlipidemia, vitamin B12 deficiency, ischemic bowel, and iron deficiency is referred to the clinic for consideration of iron infusion. ? ?The patient had a recent follow-up visit with her primary care provider on 08/16/2021 for the chief complaint of dizziness which has been occurring for approximately 1.5 weeks.  She reportedly had been feeling lethargic for 1 month. The patient received an iron infusion on 07/18/2021 with Feraheme at [facility] for iron deficiency anemia.  The patient reported that she felt improved for a few weeks after this but began to feel poorly in early April 2023. The patient had repeat CBC, iron studies, and ferritin drawn which showed mild normocytic anemia with a hemoglobin of 11.6, normal MCV at 89.2, mildly low white blood cell count of 3.6, elevated RDW at 15.7, normal ferritin at 150.9, normal saturation 21%, normal TIBC at 267, normal iron at 56. The patient also has been reporting dark stool for approximately 1 week prior to her 08/16/21 appointment.  The patient has a history of GI bleeding. Her most recent small bowel endoscopy was on 02/10/21 which showed a few non-bleeding angiodysplastic lesions in the stomach. Treated with a monopolar probe. She also had four bleeding angiodysplastic lesions in the duodenum which were also treated with a monopolar probe. She also had a single non-bleeding angiodysplastic lesion in the jejunum which was treated. She does not have any follow ups with GI at this time.  ? ?Of note,  the patient also has permanent atrial fibrillation and congestive heart failure and is followed by cardiology.  The patient takes Eliquis for atrial fibrillation.  Her aspirin was discontinued.  ? ?The oldest records available to me are from 2011. She had some mild anemia in January-February 2012. She had anemia again in December 2012.  She then had anemia of varying degrees, some severe, since 2014.  The patient states that he has had waxing and waning periods of anemia since at least 2007. She denies undergoing gastric bypass surgery.  She has required IV iron infusions at several points in her life.  Some of which have been at the cancer center in [place].  She also has received several blood transfusions.  She has been taking an over-the-counter iron gummy for several years.  She reports that the gummy contains 18 mg of iron and she takes 2 of these.  She also takes a B12 supplement.   ? ? Overall, the patient is feeling better compared to how she was a few weeks ago at her primary care office.  She reports that she does sometimes have dizzy spells.  She has not checked her blood pressure during the episodes of dizzy spells.  The dizzy spells were exacerbated by moving too quickly.  She continues to endorse on and off black stools.  She denies any visible blood in the stool.  She reports that she occasionally has mild nosebleeding but it is self-limiting and does not happen very frequently.  She denies any other  abnormal bleeding including gingival bleeding, hemoptysis, hematemesis,  hematochezia, or hematuria.  She reports that she had a colonoscopy during her  most recent endoscopy , although I am having a hard time finding record of this.  The most recent colonoscopy I can see is from 2020.   ?  ?She  denies any fever, chills, or night sweats. She sometimes has shortness of breath but has oxygen at home if needed. She reports that she "stays cold".  She has waxing and waning periods of weight loss, some of  which she attributes to fluid retention.  She denies any abdominal pain except with bowel movements that resolves. Denies any NSAID use. Denies any lymphadenopathy.  She reports that she craves ice.  Patient also has chronic kidney disease. ? ?The patient's family history consist of a mother with heart disease and hypertension and CKD.  The patient's father passed away due to an accident.  The patient sisters hypertension.  The patient denies any family history of malignancy except for maternal aunt with breast cancer in cousins with breast cancer.  Denies any family history of any blood, bone marrow, or colorectal cancer. ? ?The patient is widowed and has 2 children.  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  She is a former smoker having smoked 30 years averaging half to one third of a pack of cigarettes per day.  Denies any alcohol use.  Denies any drug use. ? ?HPI ? ?Past Medical History:  ?Diagnosis Date  ? Actinic keratosis 01/17/2015  ? R forearm  ? Adjustment disorder with anxiety   ? Anemia   ? Anginal pain ([place])   ? Arthritis   ? "some in my hands" (11/11/2012)  ? Atrial fibrillation ([place])   ? Atypical mole 03/25/2018  ? L forearm - severe  ? Automatic implantable cardioverter-defibrillator in situ   ? 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 Blood in stool   ? Carotid artery stenosis 09/2007  ? a. 09/2007: 60-79% bilateral (stable); b. 10/2008: 40-59% R 60-79%   ? Chest pain, unspecified   ? Chronic airway obstruction, not elsewhere classified   ? Chronic bronchitis ([place])   ? "get it some; not q year" (11/11/2012)  ? Chronic diastolic CHF (congestive heart failure) ([place]) 06/04/2013  ? Chronic kidney disease, unspecified   ? Coronary artery disease   ? non-obstructive by 2006 cath  ? Displaced fracture of left femoral neck ([place]) 10/09/2018  ? Frequent UTI   ? "get them a couple times/yr" (11/11/2012)  ? GI bleed 03/28/2020  ? Hemorrhage of rectum and anus   ? High cholesterol   ? History of blood  transfusion 04/2011  ? "after hip OR" (11/11/2012)  ? Hx of renal cell cancer   ? Hypertension 05/20/2011  ? Hypertr obst cardiomyop   ? Hypotension, unspecified   ? cardiac cath 2006..nonobstructive CAD 30-40s lesions.Marland KitchenETT 1/09 nondiagnostic due to poor HR response..Right Renal Cancer 2003  ? Long term (current) use of anticoagulants   ? Malignant neoplasm of kidney, except pelvis   ? Osteoarthritis of right hip   ? Other and unspecified coagulation defects   ? PONV (postoperative nausea and vomiting)   ? Presence of permanent cardiac pacemaker   ? RECTAL BLEEDING 10/13/2009  ? Qualifier: Diagnosis of  By: Chester Holstein NP, Nevin Bloodgood    ? Renal cancer ([place]) 06/2001  ? Right  ? Secondary cardiomyopathy, unspecified   ? Sinus bradycardia   ? Squamous cell carcinoma of skin 01/17/2015  ? R lat wrist  ? Squamous cell carcinoma of skin 01/29/2018  ? R post upper leg - superficially  invasive  ? Squamous cell carcinoma of skin 03/25/2018  ? L lat foot  ? Squamous cell carcinoma of skin 11/09/2020  ? left lat foot - EDC 01/01/21, recurrent 01/31/21 - MOHs 02/22/21  ? Urge incontinence   ? ? ?Past Surgical History:  ?Procedure Laterality Date  ? ABDOMINAL AORTOGRAM W/LOWER EXTREMITY N/A 02/12/2021  ? Procedure: ABDOMINAL AORTOGRAM W/LOWER EXTREMITY;  Surgeon: Waynetta Sandy, MD;  Location: [facility];  Service: Cardiovascular;  Laterality: N/A;  ? ABDOMINAL HYSTERECTOMY  1975  ? for benign causes  ? APPENDECTOMY    ? BI-VENTRICULAR PACEMAKER UPGRADE  05/04/2010  ? BIOPSY  02/28/2019  ? Procedure: BIOPSY;  Surgeon: Milus Banister, MD;  Location: [facility] ENDOSCOPY;  Service: Endoscopy;;  ? CARDIAC CATHETERIZATION  2006  ? CARDIOVERSION N/A 02/20/2018  ? Procedure: CARDIOVERSION;  Surgeon: Minna Merritts, MD;  Location: [facility] ORS;  Service: Cardiovascular;  Laterality: N/A;  ? CARDIOVERSION N/A 03/27/2018  ? Procedure: CARDIOVERSION;  Surgeon: Minna Merritts, MD;  Location: [facility] ORS;  Service: Cardiovascular;   Laterality: N/A;  ? CATARACT EXTRACTION W/ INTRAOCULAR LENS  IMPLANT, BILATERAL  01/2006-02-2006  ? CHOLECYSTECTOMY N/A 11/11/2012  ? Procedure: LAPAROSCOPIC CHOLECYSTECTOMY WITH INTRAOPERATIVE CHOLANGIOGRAM;  Surgeon

## 2021-08-28 ENCOUNTER — Other Ambulatory Visit: Payer: Self-pay | Admitting: Physician Assistant

## 2021-08-28 ENCOUNTER — Inpatient Hospital Stay: Payer: PPO

## 2021-08-28 ENCOUNTER — Inpatient Hospital Stay: Payer: PPO | Attending: Physician Assistant | Admitting: Physician Assistant

## 2021-08-28 ENCOUNTER — Other Ambulatory Visit: Payer: Self-pay

## 2021-08-28 VITALS — BP 143/67 | HR 60 | Temp 97.2°F | Resp 18 | Wt 150.7 lb

## 2021-08-28 DIAGNOSIS — Z8673 Personal history of transient ischemic attack (TIA), and cerebral infarction without residual deficits: Secondary | ICD-10-CM | POA: Diagnosis not present

## 2021-08-28 DIAGNOSIS — E538 Deficiency of other specified B group vitamins: Secondary | ICD-10-CM | POA: Diagnosis not present

## 2021-08-28 DIAGNOSIS — K922 Gastrointestinal hemorrhage, unspecified: Secondary | ICD-10-CM | POA: Insufficient documentation

## 2021-08-28 DIAGNOSIS — Z7901 Long term (current) use of anticoagulants: Secondary | ICD-10-CM | POA: Diagnosis not present

## 2021-08-28 DIAGNOSIS — Z87891 Personal history of nicotine dependence: Secondary | ICD-10-CM | POA: Diagnosis not present

## 2021-08-28 DIAGNOSIS — I130 Hypertensive heart and chronic kidney disease with heart failure and stage 1 through stage 4 chronic kidney disease, or unspecified chronic kidney disease: Secondary | ICD-10-CM | POA: Insufficient documentation

## 2021-08-28 DIAGNOSIS — D509 Iron deficiency anemia, unspecified: Secondary | ICD-10-CM | POA: Insufficient documentation

## 2021-08-28 DIAGNOSIS — Z8719 Personal history of other diseases of the digestive system: Secondary | ICD-10-CM

## 2021-08-28 DIAGNOSIS — D649 Anemia, unspecified: Secondary | ICD-10-CM

## 2021-08-28 DIAGNOSIS — D508 Other iron deficiency anemias: Secondary | ICD-10-CM

## 2021-08-28 DIAGNOSIS — N189 Chronic kidney disease, unspecified: Secondary | ICD-10-CM | POA: Insufficient documentation

## 2021-08-28 DIAGNOSIS — I4821 Permanent atrial fibrillation: Secondary | ICD-10-CM | POA: Insufficient documentation

## 2021-08-28 DIAGNOSIS — I739 Peripheral vascular disease, unspecified: Secondary | ICD-10-CM | POA: Diagnosis not present

## 2021-08-28 DIAGNOSIS — Z803 Family history of malignant neoplasm of breast: Secondary | ICD-10-CM | POA: Insufficient documentation

## 2021-08-28 DIAGNOSIS — Z8249 Family history of ischemic heart disease and other diseases of the circulatory system: Secondary | ICD-10-CM | POA: Insufficient documentation

## 2021-08-28 DIAGNOSIS — Z79899 Other long term (current) drug therapy: Secondary | ICD-10-CM | POA: Insufficient documentation

## 2021-08-28 DIAGNOSIS — I429 Cardiomyopathy, unspecified: Secondary | ICD-10-CM | POA: Diagnosis not present

## 2021-08-28 DIAGNOSIS — I509 Heart failure, unspecified: Secondary | ICD-10-CM | POA: Insufficient documentation

## 2021-08-28 DIAGNOSIS — J449 Chronic obstructive pulmonary disease, unspecified: Secondary | ICD-10-CM | POA: Diagnosis not present

## 2021-08-28 LAB — CBC WITH DIFFERENTIAL (CANCER CENTER ONLY)
Abs Immature Granulocytes: 0.01 10*3/uL (ref 0.00–0.07)
Basophils Absolute: 0 10*3/uL (ref 0.0–0.1)
Basophils Relative: 1 %
Eosinophils Absolute: 0.1 10*3/uL (ref 0.0–0.5)
Eosinophils Relative: 1 %
HCT: 34.9 % — ABNORMAL LOW (ref 36.0–46.0)
Hemoglobin: 11.1 g/dL — ABNORMAL LOW (ref 12.0–15.0)
Immature Granulocytes: 0 %
Lymphocytes Relative: 14 %
Lymphs Abs: 0.5 10*3/uL — ABNORMAL LOW (ref 0.7–4.0)
MCH: 29.1 pg (ref 26.0–34.0)
MCHC: 31.8 g/dL (ref 30.0–36.0)
MCV: 91.4 fL (ref 80.0–100.0)
Monocytes Absolute: 0.5 10*3/uL (ref 0.1–1.0)
Monocytes Relative: 13 %
Neutro Abs: 2.5 10*3/uL (ref 1.7–7.7)
Neutrophils Relative %: 71 %
Platelet Count: 168 10*3/uL (ref 150–400)
RBC: 3.82 MIL/uL — ABNORMAL LOW (ref 3.87–5.11)
RDW: 14.6 % (ref 11.5–15.5)
WBC Count: 3.5 10*3/uL — ABNORMAL LOW (ref 4.0–10.5)
nRBC: 0 % (ref 0.0–0.2)

## 2021-08-28 LAB — FOLATE: Folate: 18.8 ng/mL (ref >5.9)

## 2021-08-28 LAB — BASIC METABOLIC PANEL - CANCER CENTER ONLY
Anion gap: 7 (ref 5–15)
BUN: 32 mg/dL — ABNORMAL HIGH (ref 8–23)
CO2: 33 mmol/L — ABNORMAL HIGH (ref 22–32)
Calcium: 9.6 mg/dL (ref 8.9–10.3)
Chloride: 100 mmol/L (ref 98–111)
Creatinine: 1.38 mg/dL — ABNORMAL HIGH (ref 0.44–1.00)
GFR, Estimated: 38 mL/min — ABNORMAL LOW (ref >60)
Glucose, Bld: 77 mg/dL (ref 70–99)
Potassium: 4 mmol/L (ref 3.5–5.1)
Sodium: 140 mmol/L (ref 135–145)

## 2021-08-28 LAB — IRON AND IRON BINDING CAPACITY (CC-WL,HP ONLY)
Iron: 66 ug/dL (ref 28–170)
Saturation Ratios: 23 % (ref 10.4–31.8)
TIBC: 287 ug/dL (ref 250–450)
UIBC: 221 ug/dL (ref 148–442)

## 2021-08-28 LAB — FERRITIN: Ferritin: 104 ng/mL (ref 11–307)

## 2021-08-28 LAB — VITAMIN B12: Vitamin B-12: 927 pg/mL — ABNORMAL HIGH (ref 180–914)

## 2021-08-29 ENCOUNTER — Encounter: Payer: Self-pay | Admitting: Physician Assistant

## 2021-08-31 ENCOUNTER — Telehealth: Payer: Self-pay | Admitting: Family Medicine

## 2021-08-31 NOTE — Telephone Encounter (Signed)
Left message for patient to call back and schedule Medicare Annual Wellness Visit (AWV). ? ?Please offer to do virtually or by telephone. ? ? ? ?Last AWV: 03/19/2016 ? ? ? ?Please schedule at anytime with [facility]. ? ? ? ?30 minute appointment for Virtual or phone ?45 minute appointment for in office or Initial virtual/phone ? ? ? ?Any questions, please contact me at [phone]  ?

## 2021-09-17 NOTE — Progress Notes (Signed)
PCP: [name] Noe, MD ?Cardiology: Dr. Rockey [name] ?HF Cardiology: Dr. Aundra [name] ? ?88 y.o. with history of permanent atrial fibrillation, chronic diastolic CHF, CKD stage 3, smoking/prior COPD, renal cell CA s/p right nephrectomy.  She has a prior history of HFrEF with nonischemic cardiomyoapthy and had a Medtronic ICD placed.  However, subsequently her EF has recovered.  More recently, she has had symptomatic HFpEF.   ? ?She has had dyspnea and edema for "a long time," worse starting 1/20. In 6/20, she had left hip ORIF after mechanical fall.  Echo in 6/20 showed EF 60-65%, mild RV dilation with normal systolic function, PASP 93 mmHg.  After she got home from the hospital stay post-ORIF, she noted marked peripheral edema.  She was short of breath walking short distances.  +orthopnea.  She came back to the hospital on 11/04/18 and was admitted with volume overload.  She was diuresed aggressively and lost about 22 lbs.  RHC showed pulmonary venous hypertension.  PCWP tracing had prominent v-waves in absence of significant MR, suggesting significant diastolic dysfunction.  ? ?She has been noted to have significant Fe deficiency anemia, but FOBT was negative during 7/20 hospitalization.  ? ?She was admitted in 11/20 with symptomatic anemia, GI workup concerning for ischemic bowel.  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 Doren [name] with VVS and conservative management recommended.  Capsule endoscopy showed no definite source of bleeding.  She was volume overloaded due to significant RV dysfunction and diuresed, she also developed AKI on CKD stage 3.     ? ?In 10/22, she had bilateral CIA stents placed. ABIs in 11/22 showed patent stents.  She had a skin cancer removed from her left leg. 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  ? ?Echo 2/23 EF 65-70% with mild LVH, D-shaped septum with moderate RV enlargement, and mildly decreased RV function, PASP 92, moderate-severe  TR, dilated IVC, moderate aortic stenosis.  ? ?Follow up 2/23 she remained fatigued and had not been taking metolazone regularly. Volume elevated and instructed to resume metolazone weekly. Follow up 4/23, CardioMems low and metolazone changed to every other week. ? ?Today she returns for HF follow up with her niece. Overall feeling fatigued. She has dyspnea if she over does it with housework. She has poor energy and continues with chronic back pain. She has occasional dizziness, no falls.  Denies CP, palpitations, edema, or PND/Orthopnea. Appetite ok. No fever or chills. Weight at home 145-150 pounds. Taking all medications.  ? ?ECG (personally reviewed): atrial fibrillation, v-paced 60 bpm ? ?Medtronic device interrogation: OptiVol slowly trending up, but thoracic impedence stable, no VT, <1 hr/day activity (personally reviewed). ? ?Cardiomems PADP: 19 today, goal 17 ? ?Labs (7/20): K 3.7, creatinine 1.44 => 1.33 ?Labs (12/20): pro-BNP 431, K 4.4, creatinine 1.24, hgb 8.2 ?Labs (10/21): K 4, creatinine 1.83 ?Labs (12/21): K 3.5, creatinine 1.45 ?Labs (2/22): K 5.8 (Lokelma ordered), creatinine 1.39 ?Labs (3/22): K 4.4  Creatinine 1.42 ?Labs (8/22): K 4.2, creatinine 1.46, hgb 10.7 ?Labs (11/22): K 4.5, creatinine 1.69, LFTs normal ?Labs (1/23): hgb 11, K 4.2, creatinine 1.63, BNP 252 ?Labs (2/23): K 3.5, creatinine 1.37 ?Labs (4/23): K 4.0, creatinine 1.38 ? ?PMH: ?1. COPD ?2. Renal cell carcinoma: S/p right kidney resection.  ?3. CKD stage 3 ?4. Fe deficiency anemia ?5. Atrial fibrillation: Permanent. She has failed amiodarone, Tikosyn, and flecainide as well as multiple cardioversions. ?6. Chronic diastolic CHF: She has history of prior  HFrEF with nonischemic cardiomyopathy.  She has a Medtronic ICD.   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  ?- More recently, EF has been in normal range but she has had diastolic CHF.  ?- Echo (6/20): EF 60-65%, mildly dilated RV with PASP 93 mmHg, mild-moderate TR.  ?- RHC  (7/20): mean RA 11, PA 64/18 mean 39, mean PCWP 24 with v waves to 41, CI 4.21, PVR 1.9 WU.  ?- Cardiomems placement ?- RHC (11/20): mean RA 13, PA 63/23 mean 25 with v waves to 47 (MR only mild by echo), mean PCWP 25, CI 3.18, PVR 2.6 WU ?- Echo (11/21): EF 65-70%, moderate LVH, D-shaped septum with mildly decreased RV systolic function, RVSP 88.4 mmHg, severe RAE, severe TR, mod-sev AS. ?- Echo (2/23): EF 65-70% with mild LVH, D-shaped septum with moderate RV enlargement, and mildly decreased RV function, PASP 92, moderate-severe TR, dilated IVC, moderate aortic stenosis.  ?7. Hyperlipidemia ?8. Carotid stenosis: Carotid dopplers (1/66) with 06-30% LICA stenosis.   ?9. Possible ischemic bowel in 11/20 ?10. 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 Conservative management per Dr. Doren [name].  ?- 10/22 bilateral CIA stents.  ?- 11/22 ABIs: 1.07 left, 0.83 right with patent CIA stents.  ?11. 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  ?12. Squamous cell skin cancer.  ?13. Aortic stenosis: Moderate on 2/23 echo.  ? ?Social History  ? ?Socioeconomic History  ? Marital status: Widowed  ?  Spouse name: Not on file  ? Number of children: 2  ? Years of education: high school  ? Highest education level: Not on file  ?Occupational History  ? Occupation: Retired  ?  Employer: RETIRED  ?Tobacco Use  ? Smoking status: Former  ?  Packs/day: 0.50  ?  Years: 40.00  ?  Pack years: 20.00  ?  Types: Cigarettes  ?  Quit date: 05/06/2001  ?  Years since quitting: 20.3  ? Smokeless tobacco: Never  ?Vaping Use  ? Vaping Use: Never used  ?Substance and Sexual Activity  ? Alcohol use: No  ?  Alcohol/week: 0.0 standard drinks  ? Drug use: No  ? Sexual activity: Not Currently  ?Other Topics Concern  ? Not on file  ?Social History Narrative  ? Would desire CPR  ? 07/11/20  ? From: [place] area  ? Living: with [name] (2003)  ? Work: retired - clerical  ?   ? Family: 2 children -  [name] and [name] - 2 grand chlidren - 5 great grand children  ?   ? Enjoys: spending time with friends - watch move, eat out, spend time  ?   ? Exercise: walking around the house  ? Diet: not good, limits red meat, limits salt, low appetite  ?   ? Safety  ? Seat belts: Yes   ? Guns: Yes  and secure  ? Safe in relationships: Yes   ? ?Social Determinants of Health  ? ?Financial Resource Strain: Not on file  ?Food Insecurity: Not on file  ?Transportation Needs: Not on file  ?Physical Activity: Not on file  ?Stress: Not on file  ?Social Connections: Not on file  ?Intimate Partner Violence: Not on file  ? ?Family History  ?Problem Relation Age of Onset  ? Heart failure Mother   ? Early death Father   ?     car accident  ? Breast cancer Maternal Aunt 58  ? Breast cancer Cousin   ?  Breast cancer Other   ? Colon cancer Neg Hx   ? ?ROS: All systems reviewed and negative except as per HPI.  ? ?Current Outpatient Medications  ?Medication Sig Dispense Refill  ? acetaminophen (TYLENOL) 500 MG tablet Take 500-1,000 mg by mouth every 8 (eight) hours as needed for mild pain or headache.    ? Cholecalciferol (VITAMIN D) 50 MCG (2000 UT) CAPS Take 4,000 Units by mouth daily.    ? Cranberry (AZO CRANBERRY GUMMIES) 500 MG CHEW Chew 2,000 mg by mouth daily.    ? Cyanocobalamin (B-12 PO) Take 1 tablet by mouth daily.     ? ELIQUIS 2.5 MG TABS tablet TAKE 1 TABLET BY MOUTH TWICE A DAY 60 tablet 11  ? ezetimibe (ZETIA) 10 MG tablet Take 1 tablet (10 mg total) by mouth daily. 30 tablet 11  ? ferrous sulfate 325 (65 FE) MG tablet Take 1 tablet (325 mg total) by mouth 2 (two) times daily with a meal. 60 tablet 0  ? fluticasone (FLONASE) 50 MCG/ACT nasal spray Place 2 sprays into both nostrils daily as needed for allergies or rhinitis. 16 g 6  ? levothyroxine (SYNTHROID) 25 MCG tablet TAKE 1 TABLET BY MOUTH ONCE DAILY BEFOREBREAKFAST 30 tablet 3  ? losartan (COZAAR) 25 MG tablet Take 0.5 tablets (12.5 mg total) by mouth daily. 90 tablet 3  ?  magnesium oxide (MAG-OX) 400 MG tablet Take 1 tablet (400 mg total) by mouth daily. 30 tablet 2  ? metolazone (ZAROXOLYN) 2.5 MG tablet Take 1 tablet (2.5 mg total) by mouth as directed. Take every other T

## 2021-09-19 ENCOUNTER — Encounter (HOSPITAL_COMMUNITY): Payer: Self-pay

## 2021-09-19 ENCOUNTER — Ambulatory Visit (HOSPITAL_COMMUNITY)
Admission: RE | Admit: 2021-09-19 | Discharge: 2021-09-19 | Disposition: A | Discharge status: Home / Self Care | Payer: PPO | Source: Ambulatory Visit | Attending: Family Medicine | Admitting: Family Medicine

## 2021-09-19 ENCOUNTER — Encounter: Payer: Self-pay | Admitting: Internal Medicine

## 2021-09-19 VITALS — BP 132/58 | HR 60 | Wt 152.0 lb

## 2021-09-19 DIAGNOSIS — I4821 Permanent atrial fibrillation: Secondary | ICD-10-CM | POA: Diagnosis not present

## 2021-09-19 DIAGNOSIS — I082 Rheumatic disorders of both aortic and tricuspid valves: Secondary | ICD-10-CM | POA: Insufficient documentation

## 2021-09-19 DIAGNOSIS — Z7901 Long term (current) use of anticoagulants: Secondary | ICD-10-CM | POA: Diagnosis not present

## 2021-09-19 DIAGNOSIS — Z8719 Personal history of other diseases of the digestive system: Secondary | ICD-10-CM

## 2021-09-19 DIAGNOSIS — I10 Essential (primary) hypertension: Secondary | ICD-10-CM

## 2021-09-19 DIAGNOSIS — J449 Chronic obstructive pulmonary disease, unspecified: Secondary | ICD-10-CM | POA: Diagnosis not present

## 2021-09-19 DIAGNOSIS — Z79899 Other long term (current) drug therapy: Secondary | ICD-10-CM | POA: Diagnosis not present

## 2021-09-19 DIAGNOSIS — I5032 Chronic diastolic (congestive) heart failure: Secondary | ICD-10-CM | POA: Diagnosis not present

## 2021-09-19 DIAGNOSIS — I428 Other cardiomyopathies: Secondary | ICD-10-CM | POA: Diagnosis not present

## 2021-09-19 DIAGNOSIS — I130 Hypertensive heart and chronic kidney disease with heart failure and stage 1 through stage 4 chronic kidney disease, or unspecified chronic kidney disease: Secondary | ICD-10-CM | POA: Insufficient documentation

## 2021-09-19 DIAGNOSIS — N183 Chronic kidney disease, stage 3 unspecified: Secondary | ICD-10-CM | POA: Diagnosis not present

## 2021-09-19 DIAGNOSIS — I272 Pulmonary hypertension, unspecified: Secondary | ICD-10-CM | POA: Diagnosis not present

## 2021-09-19 DIAGNOSIS — I350 Nonrheumatic aortic (valve) stenosis: Secondary | ICD-10-CM | POA: Diagnosis not present

## 2021-09-19 DIAGNOSIS — D509 Iron deficiency anemia, unspecified: Secondary | ICD-10-CM | POA: Insufficient documentation

## 2021-09-19 DIAGNOSIS — I5042 Chronic combined systolic (congestive) and diastolic (congestive) heart failure: Secondary | ICD-10-CM | POA: Diagnosis not present

## 2021-09-19 NOTE — Patient Instructions (Signed)
Thank you for coming in today ? ? ?No medication changes  ? ?At the [facility], you and your health needs are our priority. As part of our continuing mission to provide you with exceptional heart care, we have created designated Provider Care Teams. These Care Teams include your primary Cardiologist (physician) and Advanced Practice Providers (APPs- Physician Assistants and Nurse Practitioners) who all work together to provide you with the care you need, when you need it.  ? ?You may see any of the following providers on your designated Care Team at your next follow up: ?Dr Glori Bickers ?Dr Loralie Champagne ?Darrick Grinder, NP ?Lyda Jester, PA ?Jessica Milford,NP ?Marlyce Huge, PA ?Audry Riles, PharmD ? ? ?Please be sure to bring in all your medications bottles to every appointment.  ? ?If you have any questions or concerns before your next appointment please send [place] a message through [place] or call our office at [phone].   ? ?TO LEAVE A MESSAGE FOR THE NURSE SELECT OPTION 2, PLEASE LEAVE A MESSAGE INCLUDING: ?YOUR NAME ?DATE OF BIRTH ?CALL BACK NUMBER ?REASON FOR CALL**this is important as we prioritize the call backs ? ?YOU WILL RECEIVE A CALL BACK THE SAME DAY AS LONG AS YOU CALL BEFORE 4:00 PM ? ?

## 2021-09-24 ENCOUNTER — Ambulatory Visit (INDEPENDENT_AMBULATORY_CARE_PROVIDER_SITE_OTHER): Payer: PPO

## 2021-09-24 VITALS — Ht 66.5 in | Wt 141.0 lb

## 2021-09-24 DIAGNOSIS — Z0000 Encounter for general adult medical examination without abnormal findings: Secondary | ICD-10-CM | POA: Diagnosis not present

## 2021-09-24 DIAGNOSIS — I428 Other cardiomyopathies: Secondary | ICD-10-CM | POA: Diagnosis not present

## 2021-09-24 NOTE — Progress Notes (Signed)
I connected with [name] today by telephone and verified that I am speaking with the correct person using two identifiers. Location patient: home Location provider: work Persons participating in the virtual visit: Loye Reininger, Glenna Durand LPN.   I discussed the limitations, risks, security and privacy concerns of performing an evaluation and management service by telephone and the availability of in person appointments. I also discussed with the patient that there may be a patient responsible charge related to this service. The patient expressed understanding and verbally consented to this telephonic visit.    Interactive audio and video telecommunications were attempted between this provider and patient, however failed, due to patient having technical difficulties OR patient did not have access to video capability.  We continued and completed visit with audio only.     Vital signs may be patient reported or missing.  Subjective:   TYRIHANNA WINGERT is a 88 y.o. female who presents for Medicare Annual (Subsequent) preventive examination.  Review of Systems     Cardiac Risk Factors include: advanced age (>76mn, >>40women);dyslipidemia;hypertension     Objective:    Today's Vitals   09/24/21 1256 09/24/21 1257  Weight: 141 lb (64 kg)   Height: 5' 6.5" (1.689 m)   PainSc:  5    Body mass index is 22.42 kg/m.     09/24/2021    1:02 PM 02/08/2021    7:24 AM 03/27/2020   12:29 AM 03/23/2020   11:19 AM 12/23/2019    1:55 PM 02/27/2019    8:00 AM 02/25/2019    1:46 AM  Advanced Directives  Does Patient Have a Medical Advance Directive? No No No No No Yes;No Yes  Does patient want to make changes to medical advance directive?   No - Patient declined Yes (MAU/Ambulatory/Procedural Areas - Information given) Yes (MAU/Ambulatory/Procedural Areas - Information given) No - Patient declined No - Patient declined  Would patient like information on creating a medical advance  directive?  No - Patient declined No - Patient declined  No - Patient declined No - Patient declined No - Patient declined    Current Medications (verified) Outpatient Encounter Medications as of 09/24/2021  Medication Sig   acetaminophen (TYLENOL) 500 MG tablet Take 500-1,000 mg by mouth every 8 (eight) hours as needed for mild pain or headache.   Cholecalciferol (VITAMIN D) 50 MCG (2000 UT) CAPS Take 4,000 Units by mouth daily.   Cranberry (AZO CRANBERRY GUMMIES) 500 MG CHEW Chew 2,000 mg by mouth daily.   Cyanocobalamin (B-12 PO) Take 1 tablet by mouth daily.    ELIQUIS 2.5 MG TABS tablet TAKE 1 TABLET BY MOUTH TWICE A DAY   ezetimibe (ZETIA) 10 MG tablet Take 1 tablet (10 mg total) by mouth daily.   ferrous sulfate 325 (65 FE) MG tablet Take 1 tablet (325 mg total) by mouth 2 (two) times daily with a meal.   fluticasone (FLONASE) 50 MCG/ACT nasal spray Place 2 sprays into both nostrils daily as needed for allergies or rhinitis.   levothyroxine (SYNTHROID) 25 MCG tablet TAKE 1 TABLET BY MOUTH ONCE DAILY BEFOREBREAKFAST   losartan (COZAAR) 25 MG tablet Take 0.5 tablets (12.5 mg total) by mouth daily.   magnesium oxide (MAG-OX) 400 MG tablet Take 1 tablet (400 mg total) by mouth daily.   metolazone (ZAROXOLYN) 2.5 MG tablet Take 1 tablet (2.5 mg total) by mouth as directed. Take every other Thursday with your morning dose of Torsemide (Patient taking differently: Take 2.5 mg by mouth  as directed. Take Thursday with your morning dose of Torsemide)   pantoprazole (PROTONIX) 40 MG tablet Take 1 tablet (40 mg total) by mouth 2 (two) times daily before a meal.   potassium chloride SA (KLOR-CON M20) 20 MEQ tablet Take 2 tablets (40 mEq total) by mouth 2 (two) times daily. Take 1 extra tablet on the days you take metolazone.   rosuvastatin (CRESTOR) 40 MG tablet Take 1 tablet (40 mg total) by mouth daily.   torsemide (DEMADEX) 100 MG tablet Take 100 mg by mouth daily. 80 mg in the PM   vitamin C  (ASCORBIC ACID) 250 MG tablet Take 500 mg by mouth daily.   ondansetron (ZOFRAN) 4 MG tablet Take 1 tablet (4 mg total) by mouth every 8 (eight) hours as needed for nausea or vomiting. (Patient not taking: Reported on 09/24/2021)   No facility-administered encounter medications on file as of 09/24/2021.    Allergies (verified) Dilaudid [hydromorphone], Fentanyl, Codeine, Morphine and related, Whole blood, and Sulfonamide derivatives   History: Past Medical History:  Diagnosis Date   Actinic keratosis 01/17/2015   R forearm   Adjustment disorder with anxiety    Anemia    Anginal pain ([place])    Arthritis    "some in my hands" (11/11/2012)   Atrial fibrillation (HCC)    Atypical mole 03/25/2018   L forearm - severe   Automatic implantable cardioverter-defibrillator in situ    Avascular necrosis of hip ([place]) 05/03/2011   Blood in stool    Carotid artery stenosis 09/2007   a. 09/2007: 60-79% bilateral (stable); b. 10/2008: 40-59% R 60-79%    Chest pain, unspecified    Chronic airway obstruction, not elsewhere classified    Chronic bronchitis ([place])    "get it some; not q year" (11/11/2012)   Chronic diastolic CHF (congestive heart failure) ([place]) 06/04/2013   Chronic kidney disease, unspecified    Coronary artery disease    non-obstructive by 2006 cath   Displaced fracture of left femoral neck ([place]) 10/09/2018   Frequent UTI    "get them a couple times/yr" (11/11/2012)   GI bleed 03/28/2020   Hemorrhage of rectum and anus    High cholesterol    History of blood transfusion 04/2011   "after hip OR" (11/11/2012)   Hx of renal cell cancer    Hypertension 05/20/2011   Hypertr obst cardiomyop    Hypotension, unspecified    cardiac cath 2006..nonobstructive CAD 30-40s lesions.Marland KitchenETT 1/09 nondiagnostic due to poor HR response..Right Renal Cancer 2003   Long term (current) use of anticoagulants    Malignant neoplasm of kidney, except pelvis    Osteoarthritis of right hip    Other and unspecified  coagulation defects    PONV (postoperative nausea and vomiting)    Presence of permanent cardiac pacemaker    RECTAL BLEEDING 10/13/2009   Qualifier: Diagnosis of  By: [place] Holstein NP, Paula     Renal cancer [facility]) 06/2001   Right   Secondary cardiomyopathy, unspecified    Sinus bradycardia    Squamous cell carcinoma of skin 01/17/2015   R lat wrist   Squamous cell carcinoma of skin 01/29/2018   R post upper leg - superficially invasive   Squamous cell carcinoma of skin 03/25/2018   L lat foot   Squamous cell carcinoma of skin 11/09/2020   left lat foot - EDC 01/01/21, recurrent 01/31/21 - MOHs 02/22/21   Urge incontinence    Past Surgical History:  Procedure Laterality Date   ABDOMINAL AORTOGRAM  W/LOWER EXTREMITY N/A 02/12/2021   Procedure: ABDOMINAL AORTOGRAM W/LOWER EXTREMITY;  Surgeon: Waynetta Sandy, MD;  Location: [place] [facility];  Service: Cardiovascular;  Laterality: N/A;   ABDOMINAL HYSTERECTOMY  1975   for benign causes   APPENDECTOMY     BI-VENTRICULAR PACEMAKER UPGRADE  05/04/2010   BIOPSY  02/28/2019   Procedure: BIOPSY;  Surgeon: Milus Banister, MD;  Location: [facility] ENDOSCOPY;  Service: Endoscopy;;   CARDIAC CATHETERIZATION  2006   CARDIOVERSION N/A 02/20/2018   Procedure: CARDIOVERSION;  Surgeon: Minna Merritts, MD;  Location: [place] ORS;  Service: Cardiovascular;  Laterality: N/A;   CARDIOVERSION N/A 03/27/2018   Procedure: CARDIOVERSION;  Surgeon: Minna Merritts, MD;  Location: [facility] ORS;  Service: Cardiovascular;  Laterality: N/A;   CATARACT EXTRACTION W/ INTRAOCULAR LENS  IMPLANT, BILATERAL  01/2006-02-2006   CHOLECYSTECTOMY N/A 11/11/2012   Procedure: LAPAROSCOPIC CHOLECYSTECTOMY WITH INTRAOPERATIVE CHOLANGIOGRAM;  Surgeon: Imogene Burn. Georgette Dover, MD;  Location: [place];  Service: General;  Laterality: N/A;   COLONOSCOPY WITH PROPOFOL N/A 02/28/2019   Procedure: COLONOSCOPY WITH PROPOFOL;  Surgeon: Milus Banister, MD;  Location: [facility] ENDOSCOPY;  Service:  Endoscopy;  Laterality: N/A;   ENTEROSCOPY N/A 03/30/2020   Procedure: ENTEROSCOPY;  Surgeon: Lavena Bullion, DO;  Location: [place];  Service: Gastroenterology;  Laterality: N/A;   ENTEROSCOPY N/A 02/10/2021   Procedure: ENTEROSCOPY;  Surgeon: Carol Ada, MD;  Location: [place];  Service: Endoscopy;  Laterality: N/A;   EP IMPLANTABLE DEVICE N/A 02/21/2016   Procedure: ICD Generator Changeout;  Surgeon: Deboraha Sprang, MD;  Location: [place] CV LAB;  Service: Cardiovascular;  Laterality: N/A;   ESOPHAGOGASTRODUODENOSCOPY (EGD) WITH PROPOFOL N/A 02/28/2019   Procedure: ESOPHAGOGASTRODUODENOSCOPY (EGD) WITH PROPOFOL;  Surgeon: Milus Banister, MD;  Location: [facility] ENDOSCOPY;  Service: Endoscopy;  Laterality: N/A;   GIVENS CAPSULE STUDY N/A 03/15/2019   Procedure: GIVENS CAPSULE STUDY;  Surgeon: Thornton Park, MD;  Location: [place];  Service: Gastroenterology;  Laterality: N/A;   HOT HEMOSTASIS N/A 03/30/2020   Procedure: HOT HEMOSTASIS (ARGON PLASMA COAGULATION/BICAP);  Surgeon: Lavena Bullion, DO;  Location: [facility] ENDOSCOPY;  Service: Gastroenterology;  Laterality: N/A;   HOT HEMOSTASIS N/A 02/10/2021   Procedure: HOT HEMOSTASIS (ARGON PLASMA COAGULATION/BICAP);  Surgeon: Carol Ada, MD;  Location: [place];  Service: Endoscopy;  Laterality: N/A;   INSERT / REPLACE / REMOVE PACEMAKER  05-01-11   02-28-05-/05-04-10-ICD-MEDTRONIC MAXIMAL DR   JOINT REPLACEMENT     LAPAROSCOPIC CHOLECYSTECTOMY  11/11/2012   LAPAROSCOPIC LYSIS OF ADHESIONS N/A 11/11/2012   Procedure: LAPAROSCOPIC LYSIS OF ADHESIONS;  Surgeon: Imogene Burn. Georgette Dover, MD;  Location: [place];  Service: General;  Laterality: N/A;   NEPHRECTOMY Right 06/2001    S/P RENAL CELL CANCER   PERIPHERAL VASCULAR INTERVENTION Bilateral 02/12/2021   Procedure: PERIPHERAL VASCULAR INTERVENTION;  Surgeon: Waynetta Sandy, MD;  Location: [place] [facility];  Service: Cardiovascular;  Laterality: Bilateral;  Iliac  artery stents   PRESSURE SENSOR/CARDIOMEMS N/A 02/03/2019   Procedure: PRESSURE SENSOR/CARDIOMEMS;  Surgeon: Larey Dresser, MD;  Location: [place] [facility] LAB;  Service: Cardiovascular;  Laterality: N/A;   RIGHT HEART CATH N/A 11/09/2018   Procedure: RIGHT HEART CATH;  Surgeon: Larey Dresser, MD;  Location: [place] [facility];  Service: Cardiovascular;  Laterality: N/A;   RIGHT HEART CATH N/A 03/08/2019   Procedure: RIGHT HEART CATH;  Surgeon: Larey Dresser, MD;  Location: [place] [facility];  Service: Cardiovascular;  Laterality: N/A;   SUBMUCOSAL TATTOO  INJECTION  02/28/2019   Procedure: SUBMUCOSAL TATTOO INJECTION;  Surgeon: Milus Banister, MD;  Location: [facility] ENDOSCOPY;  Service: Endoscopy;;   TOTAL HIP ARTHROPLASTY Right 05/03/2011   Procedure: TOTAL HIP ARTHROPLASTY ANTERIOR APPROACH;  Surgeon: Mcarthur Rossetti;  Location: [facility];  Service: Orthopedics;  Laterality: Right;  Removal of Cannulated Screws Right Hip, Right Direct Anterior Hip Replacement   TOTAL HIP ARTHROPLASTY Left 10/09/2018   Procedure: TOTAL HIP ARTHROPLASTY ANTERIOR APPROACH;  Surgeon: Rod Can, MD;  Location: [place];  Service: Orthopedics;  Laterality: Left;   Family History  Problem Relation Age of Onset   Heart failure Mother    Early death Father        car accident   Breast cancer Maternal Aunt 82   Breast cancer Cousin    Breast cancer Other    Colon cancer Neg Hx    Social History   Socioeconomic History   Marital status: Widowed    Spouse name: Not on file   Number of children: 2   Years of education: high school   Highest education level: Not on file  Occupational History   Occupation: Retired    Fish farm manager: RETIRED  Tobacco Use   Smoking status: Former    Packs/day: 0.50    Years: 40.00    Pack years: 20.00    Types: Cigarettes    Quit date: 05/06/2001    Years since quitting: 20.4   Smokeless tobacco: Never  Vaping Use   Vaping Use: Never used  Substance and Sexual Activity    Alcohol use: No    Alcohol/week: 0.0 standard drinks   Drug use: No   Sexual activity: Not Currently  Other Topics Concern   Not on file  Social History Narrative   Would desire CPR   07/11/20   From: the area   Living: with great-grandson - [name] (2003)   Work: retired - clerical      Family: 2 children - [name] and Golden - 2 grand chlidren - 5 great grand children      Enjoys: spending time with friends - watch move, eat out, spend time      Exercise: walking around the house   Diet: not good, limits red meat, limits salt, low appetite      Safety   Seat belts: Yes    Guns: Yes  and secure   Safe in relationships: Yes    Social Determinants of Health   Financial Resource Strain: Low Risk    Difficulty of Paying Living Expenses: Not hard at all  Food Insecurity: No Food Insecurity   Worried About Charity fundraiser in the Last Year: Never true   [place] in the Last Year: Never true  Transportation Needs: No Transportation Needs   Lack of Transportation (Medical): No   Lack of Transportation (Non-Medical): No  Physical Activity: Inactive   Days of Exercise per Week: 0 days   Minutes of Exercise per Session: 0 min  Stress: No Stress Concern Present   Feeling of Stress : Not at all  Social Connections: Not on file    Tobacco Counseling Counseling given: Not Answered   Clinical Intake:  Pre-visit preparation completed: Yes  Pain : 0-10 Pain Score: 5  Pain Type: Chronic pain Pain Location: Back Pain Orientation: Lower, Upper Pain Descriptors / Indicators: Aching Pain Onset: More than a month ago Pain Frequency: Intermittent     Nutritional Status: BMI of 19-24  Normal Nutritional Risks:  None Diabetes: No  How often do you need to have someone help you when you read instructions, pamphlets, or other written materials from your doctor or pharmacy?: 1 - Never  Diabetic? no  Interpreter Needed?: No  Information entered by :: NAllen  LPN   Activities of Daily Living    09/24/2021    1:03 PM 02/12/2021    2:00 PM  In your present state of health, do you have any difficulty performing the following activities:  Hearing? 0 0  Vision? 0 0  Difficulty concentrating or making decisions? 0 0  Walking or climbing stairs? 1 0  Dressing or bathing? 0 0  Doing errands, shopping? 1 1  Comment limits driving   Preparing Food and eating ? N   Using the Toilet? N   In the past six months, have you accidently leaked urine? Y   Do you have problems with loss of bowel control? N   Managing your Medications? N   Managing your Finances? N   Housekeeping or managing your Housekeeping? N     Patient Care Team: Lesleigh Noe, MD as PCP - General (Family Medicine) Deboraha Sprang, MD as PCP - Cardiology (Cardiology) Larey Dresser, MD as PCP - Advanced Heart Failure (Cardiology) Minna Merritts, MD as Consulting Physician (Cardiology) Deboraha Sprang, MD as Consulting Physician (Cardiology) [name], Dolores Lory, MD as Consulting Physician (Vascular Surgery) Debbora Dus, [facility] as Pharmacist (Pharmacist) Sindy Guadeloupe, MD as Consulting Physician (Hematology and Oncology)  Indicate any recent Medical Services you may have received from other than [facility] providers in the past year (date may be approximate).     Assessment:   This is a routine wellness examination for [place].  Hearing/Vision screen Vision Screening - Comments:: No regular eye exams, [facility]  Dietary issues and exercise activities discussed: Current Exercise Habits: The patient does not participate in regular exercise at present   Goals Addressed             This Visit's Progress    Patient Stated       09/24/2021, stay alive       Depression Screen    09/24/2021    1:03 PM 02/09/2020    4:09 PM 02/07/2017   12:10 PM 03/19/2016   10:50 AM 03/06/2015   12:10 PM  PHQ 2/9 Scores  PHQ - 2 Score 0 0 0 0 0    Fall Risk     09/24/2021    1:03 PM 07/16/2021    3:07 PM 04/17/2020   12:12 PM 02/09/2020    4:09 PM 02/07/2017   12:10 PM  [place] in the past year? 0 0 0 0 No  Number falls in past yr: 0 0 0 0   Injury with Fall? 0  0 0   Risk for fall due to : Medication side effect      Follow up Falls evaluation completed;Education provided;Falls prevention discussed  Falls evaluation completed      FALL RISK PREVENTION PERTAINING TO THE HOME:  Any stairs in or around the home? No  If so, are there any without handrails?  N/a Home free of loose throw rugs in walkways, pet beds, electrical cords, etc? Yes  Adequate lighting in your home to reduce risk of falls? Yes   ASSISTIVE DEVICES UTILIZED TO PREVENT FALLS:  Life alert? No  Use of a cane, walker or w/c? No  Grab bars in the bathroom? No  Shower chair or bench in shower? Yes  Elevated toilet seat or a handicapped toilet? Yes   TIMED UP AND GO:  Was the test performed? No .      Cognitive Function:    03/19/2016   10:50 AM  MMSE - Mini Mental State Exam  Orientation to time 5  Orientation to Place 5  Registration 3  Attention/ Calculation 0  Recall 3  Language- name 2 objects 0  Language- repeat 1  Language- follow 3 step command 3  Language- read & follow direction 0  Write a sentence 0  Copy design 0  Total score 20        09/24/2021    1:05 PM  6CIT Screen  What Year? 0 points  What month? 0 points  What time? 0 points  Count back from 20 0 points  Months in reverse 0 points  Repeat phrase 0 points  Total Score 0 points    Immunizations Immunization History  Administered Date(s) Administered   Fluad Quad(high Dose 65+) 03/06/2020, 05/10/2021   Influenza Split 03/21/2011, 02/14/2012   Influenza Whole 04/10/2009, 04/17/2010   Influenza,inj,Quad PF,6+ Mos 03/24/2013, 01/14/2014, 02/20/2015, 02/12/2016, 02/07/2017, 03/19/2018   PFIZER(Purple Top)SARS-COV-2 Vaccination 06/15/2019, 07/06/2019   Pneumococcal  Conjugate-13 03/06/2015   Pneumococcal Polysaccharide-23 04/21/2001   Td 01/25/2004   Tdap 08/17/2010   Zoster, Live 04/10/2009    TDAP status: Due, Education has been provided regarding the importance of this vaccine. Advised may receive this vaccine at local pharmacy or Health Dept. Aware to provide a copy of the vaccination record if obtained from local pharmacy or Health Dept. Verbalized acceptance and understanding.  Flu Vaccine status: Up to date  Pneumococcal vaccine status: Up to date  Covid-19 vaccine status: Completed vaccines  Qualifies for Shingles Vaccine? Yes   Zostavax completed Yes   Shingrix Completed?: No.    Education has been provided regarding the importance of this vaccine. Patient has been advised to call insurance company to determine out of pocket expense if they have not yet received this vaccine. Advised may also receive vaccine at local pharmacy or Health Dept. Verbalized acceptance and understanding.  Screening Tests Health Maintenance  Topic Date Due   Zoster Vaccines- Shingrix (1 of 2) Never done   COVID-19 Vaccine (3 - Pfizer risk series) 08/03/2019   TETANUS/TDAP  08/16/2020   MAMMOGRAM  10/09/2021   INFLUENZA VACCINE  12/04/2021   Pneumonia Vaccine 30+ Years old  Completed   DEXA SCAN  Completed   HPV VACCINES  Aged Out    Health Maintenance  Health Maintenance Due  Topic Date Due   Zoster Vaccines- Shingrix (1 of 2) Never done   COVID-19 Vaccine (3 - Pfizer risk series) 08/03/2019   TETANUS/TDAP  08/16/2020    Colorectal cancer screening: No longer required.   Mammogram status: Completed 10/09/2020. Repeat every year  Bone Density status: Completed 04/26/2009.  Lung Cancer Screening: (Low Dose CT Chest recommended if Age 67-80 years, 30 pack-year currently smoking OR have quit w/in 15years.) does not qualify.   Lung Cancer Screening Referral: no  Additional Screening:  Hepatitis C Screening: does not qualify;   Vision Screening:  Recommended annual ophthalmology exams for early detection of glaucoma and other disorders of the eye. Is the patient up to date with their annual eye exam?  No  Who is the provider or what is the name of the office in which the patient attends annual eye exams? [facility] If pt is not  established with a provider, would they like to be referred to a provider to establish care? No .   Dental Screening: Recommended annual dental exams for proper oral hygiene  Community Resource Referral / Chronic Care Management: CRR required this visit?  No   CCM required this visit?  No      Plan:     I have personally reviewed and noted the following in the patient's chart:   Medical and social history Use of alcohol, tobacco or illicit drugs  Current medications and supplements including opioid prescriptions.  Functional ability and status Nutritional status Physical activity Advanced directives List of other physicians Hospitalizations, surgeries, and ER visits in previous 12 months Vitals Screenings to include cognitive, depression, and falls Referrals and appointments  In addition, I have reviewed and discussed with patient certain preventive protocols, quality metrics, and best practice recommendations. A written personalized care plan for preventive services as well as general preventive health recommendations were provided to patient.     Kellie Simmering, LPN   [id number]   Nurse Notes: none  Due to this being a virtual visit, the after visit summary with patients personalized plan was offered to patient via mail or my-chart. Patient would like to access on my-chart

## 2021-09-24 NOTE — Patient Instructions (Signed)
[name] , Thank you for taking time to come for your Medicare Wellness Visit. I appreciate your ongoing commitment to your health goals. Please review the following plan we discussed and let me know if I can assist you in the future.   Screening recommendations/referrals: Colonoscopy: not required Mammogram: completed 10/09/2020, due 10/10/2021 Bone Density: completed 04/26/2009 Recommended yearly ophthalmology/optometry visit for glaucoma screening and checkup Recommended yearly dental visit for hygiene and checkup  Vaccinations: Influenza vaccine: due 12/04/2021 Pneumococcal vaccine: completed 03/06/2015 Tdap vaccine: due Shingles vaccine: discussed   Covid-19: 07/06/2019, 06/15/2019  Advanced directives: Advance directive discussed with you today.   Conditions/risks identified: none  Next appointment: Follow up in one year for your annual wellness visit    Preventive Care 65 Years and Older, Female Preventive care refers to lifestyle choices and visits with your health care provider that can promote health and wellness. What does preventive care include? A yearly physical exam. This is also called an annual well check. Dental exams once or twice a year. Routine eye exams. Ask your health care provider how often you should have your eyes checked. Personal lifestyle choices, including: Daily care of your teeth and gums. Regular physical activity. Eating a healthy diet. Avoiding tobacco and drug use. Limiting alcohol use. Practicing safe sex. Taking low-dose aspirin every day. Taking vitamin and mineral supplements as recommended by your health care provider. What happens during an annual well check? The services and screenings done by your health care provider during your annual well check will depend on your age, overall health, lifestyle risk factors, and family history of disease. Counseling  Your health care provider may ask you questions about your: Alcohol use. Tobacco  use. Drug use. Emotional well-being. Home and relationship well-being. Sexual activity. Eating habits. History of falls. Memory and ability to understand (cognition). Work and work Statistician. Reproductive health. Screening  You may have the following tests or measurements: Height, weight, and BMI. Blood pressure. Lipid and cholesterol levels. These may be checked every 5 years, or more frequently if you are over 67 years old. Skin check. Lung cancer screening. You may have this screening every year starting at age 52 if you have a 30-pack-year history of smoking and currently smoke or have quit within the past 15 years. Fecal occult blood test (FOBT) of the stool. You may have this test every year starting at age 30. Flexible sigmoidoscopy or colonoscopy. You may have a sigmoidoscopy every 5 years or a colonoscopy every 10 years starting at age 75. Hepatitis C blood test. Hepatitis B blood test. Sexually transmitted disease (STD) testing. Diabetes screening. This is done by checking your blood sugar (glucose) after you have not eaten for a while (fasting). You may have this done every 1-3 years. Bone density scan. This is done to screen for osteoporosis. You may have this done starting at age 30. Mammogram. This may be done every 1-2 years. Talk to your health care provider about how often you should have regular mammograms. Talk with your health care provider about your test results, treatment options, and if necessary, the need for more tests. Vaccines  Your health care provider may recommend certain vaccines, such as: Influenza vaccine. This is recommended every year. Tetanus, diphtheria, and acellular pertussis (Tdap, Td) vaccine. You may need a Td booster every 10 years. Zoster vaccine. You may need this after age 41. Pneumococcal 13-valent conjugate (PCV13) vaccine. One dose is recommended after age 62. Pneumococcal polysaccharide (PPSV23) vaccine. One dose is recommended  after age 80. Talk to your health care provider about which screenings and vaccines you need and how often you need them. This information is not intended to replace advice given to you by your health care provider. Make sure you discuss any questions you have with your health care provider. Document Released: 05/19/2015 Document Revised: 01/10/2016 Document Reviewed: 02/21/2015 Elsevier Interactive Patient Education  2017 [employer] Prevention in the Home Falls can cause injuries. They can happen to people of all ages. There are many things you can do to make your home safe and to help prevent falls. What can I do on the outside of my home? Regularly fix the edges of walkways and driveways and fix any cracks. Remove anything that might make you trip as you walk through a door, such as a raised step or threshold. Trim any bushes or trees on the path to your home. Use bright outdoor lighting. Clear any walking paths of anything that might make someone trip, such as rocks or tools. Regularly check to see if handrails are loose or broken. Make sure that both sides of any steps have handrails. Any raised decks and porches should have guardrails on the edges. Have any leaves, snow, or ice cleared regularly. Use sand or salt on walking paths during winter. Clean up any spills in your garage right away. This includes oil or grease spills. What can I do in the bathroom? Use night lights. Install grab bars by the toilet and in the tub and shower. Do not use towel bars as grab bars. Use non-skid mats or decals in the tub or shower. If you need to sit down in the shower, use a plastic, non-slip stool. Keep the floor dry. Clean up any water that spills on the floor as soon as it happens. Remove soap buildup in the tub or shower regularly. Attach bath mats securely with double-sided non-slip rug tape. Do not have throw rugs and other things on the floor that can make you trip. What can I do  in the bedroom? Use night lights. Make sure that you have a light by your bed that is easy to reach. Do not use any sheets or blankets that are too big for your bed. They should not hang down onto the floor. Have a firm chair that has side arms. You can use this for support while you get dressed. Do not have throw rugs and other things on the floor that can make you trip. What can I do in the kitchen? Clean up any spills right away. Avoid walking on wet floors. Keep items that you use a lot in easy-to-reach places. If you need to reach something above you, use a strong step stool that has a grab bar. Keep electrical cords out of the way. Do not use floor polish or wax that makes floors slippery. If you must use wax, use non-skid floor wax. Do not have throw rugs and other things on the floor that can make you trip. What can I do with my stairs? Do not leave any items on the stairs. Make sure that there are handrails on both sides of the stairs and use them. Fix handrails that are broken or loose. Make sure that handrails are as long as the stairways. Check any carpeting to make sure that it is firmly attached to the stairs. Fix any carpet that is loose or worn. Avoid having throw rugs at the top or bottom of the stairs. If you do  have throw rugs, attach them to the floor with carpet tape. Make sure that you have a light switch at the top of the stairs and the bottom of the stairs. If you do not have them, ask someone to add them for you. What else can I do to help prevent falls? Wear shoes that: Do not have high heels. Have rubber bottoms. Are comfortable and fit you well. Are closed at the toe. Do not wear sandals. If you use a stepladder: Make sure that it is fully opened. Do not climb a closed stepladder. Make sure that both sides of the stepladder are locked into place. Ask someone to hold it for you, if possible. Clearly mark and make sure that you can see: Any grab bars or  handrails. First and last steps. Where the edge of each step is. Use tools that help you move around (mobility aids) if they are needed. These include: Canes. Walkers. Scooters. Crutches. Turn on the lights when you go into a dark area. Replace any light bulbs as soon as they burn out. Set up your furniture so you have a clear path. Avoid moving your furniture around. If any of your floors are uneven, fix them. If there are any pets around you, be aware of where they are. Review your medicines with your doctor. Some medicines can make you feel dizzy. This can increase your chance of falling. Ask your doctor what other things that you can do to help prevent falls. This information is not intended to replace advice given to you by your health care provider. Make sure you discuss any questions you have with your health care provider. Document Released: 02/16/2009 Document Revised: 09/28/2015 Document Reviewed: 05/27/2014 Elsevier Interactive Patient Education  2017 [employer].

## 2021-09-24 NOTE — Progress Notes (Unsigned)
HISTORY AND PHYSICAL     CC:  follow up. Requesting Provider:  Lesleigh Noe, MD  HPI: This is a 88 y.o. female who is here today for follow up for PAD.  She was seen in consultation at the hospital on 02/08/21 by Dr. Stanford Breed with non healing wound of her left lower extremity following excisional biopsy for squamous cell carcinoma. She also was having rest pain. She reported long history of intermittent claudication. Known history of PAD. Angiography was recommended. On 02/12/21 she underwent CO2 Aortogram, LLE arteriogram with bilateral common iliac artery stenting (8 x 29 mm VBX) by Dr Donzetta Matters. She did well post intervention. On discharge she was advised to continue her Aspirin, Eliquis and Crestor.  Pt was last seen 03/21/2021 and at that time, her rest pain and claudication were resolved and she was able to ambulate without discomfort.  She had been in an [place] boot and this was discontinued about 2 weeks prior.    The pt returns today for follow up.  ***  The pt is on a statin for cholesterol management.    The pt is not on an aspirin.    Other AC:  Eliquis The pt is on ARB, diuretic for hypertension.  The pt does not have diabetes. Tobacco hx:  former  Pt does *** have family hx of AAA.  Past Medical History:  Diagnosis Date   Actinic keratosis 01/17/2015   R forearm   Adjustment disorder with anxiety    Anemia    Anginal pain ([place])    Arthritis    "some in my hands" (11/11/2012)   Atrial fibrillation (HCC)    Atypical mole 03/25/2018   L forearm - severe   Automatic implantable cardioverter-defibrillator in situ    Avascular necrosis of hip ([place]) 05/03/2011   Blood in stool    Carotid artery stenosis 09/2007   a. 09/2007: 60-79% bilateral (stable); b. 10/2008: 40-59% R 60-79%    Chest pain, unspecified    Chronic airway obstruction, not elsewhere classified    Chronic bronchitis ([place])    "get it some; not q year" (11/11/2012)   Chronic diastolic CHF (congestive heart failure)  ([place]) 06/04/2013   Chronic kidney disease, unspecified    Coronary artery disease    non-obstructive by 2006 cath   Displaced fracture of left femoral neck ([place]) 10/09/2018   Frequent UTI    "get them a couple times/yr" (11/11/2012)   GI bleed 03/28/2020   Hemorrhage of rectum and anus    High cholesterol    History of blood transfusion 04/2011   "after hip OR" (11/11/2012)   Hx of renal cell cancer    Hypertension 05/20/2011   Hypertr obst cardiomyop    Hypotension, unspecified    cardiac cath 2006..nonobstructive CAD 30-40s lesions.Marland KitchenETT 1/09 nondiagnostic due to poor HR response..Right Renal Cancer 2003   Long term (current) use of anticoagulants    Malignant neoplasm of kidney, except pelvis    Osteoarthritis of right hip    Other and unspecified coagulation defects    PONV (postoperative nausea and vomiting)    Presence of permanent cardiac pacemaker    RECTAL BLEEDING 10/13/2009   Qualifier: Diagnosis of  By: Chester Holstein NP, Paula     Renal cancer [facility]) 06/2001   Right   Secondary cardiomyopathy, unspecified    Sinus bradycardia    Squamous cell carcinoma of skin 01/17/2015   R lat wrist   Squamous cell carcinoma of skin 01/29/2018   R post  upper leg - superficially invasive   Squamous cell carcinoma of skin 03/25/2018   L lat foot   Squamous cell carcinoma of skin 11/09/2020   left lat foot - EDC 01/01/21, recurrent 01/31/21 - MOHs 02/22/21   Urge incontinence     Past Surgical History:  Procedure Laterality Date   ABDOMINAL AORTOGRAM W/LOWER EXTREMITY N/A 02/12/2021   Procedure: ABDOMINAL AORTOGRAM W/LOWER EXTREMITY;  Surgeon: Waynetta Sandy, MD;  Location: [place] [facility];  Service: Cardiovascular;  Laterality: N/A;   ABDOMINAL HYSTERECTOMY  1975   for benign causes   APPENDECTOMY     BI-VENTRICULAR PACEMAKER UPGRADE  05/04/2010   BIOPSY  02/28/2019   Procedure: BIOPSY;  Surgeon: Milus Banister, MD;  Location: [facility]COPY;  Service: Endoscopy;;    CARDIAC CATHETERIZATION  2006   CARDIOVERSION N/A 02/20/2018   Procedure: CARDIOVERSION;  Surgeon: Minna Merritts, MD;  Location: [place] [facility];  Service: Cardiovascular;  Laterality: N/A;   CARDIOVERSION N/A 03/27/2018   Procedure: CARDIOVERSION;  Surgeon: Minna Merritts, MD;  Location: [facility] ORS;  Service: Cardiovascular;  Laterality: N/A;   CATARACT EXTRACTION W/ INTRAOCULAR LENS  IMPLANT, BILATERAL  01/2006-02-2006   CHOLECYSTECTOMY N/A 11/11/2012   Procedure: LAPAROSCOPIC CHOLECYSTECTOMY WITH INTRAOPERATIVE CHOLANGIOGRAM;  Surgeon: Imogene Burn. Georgette Dover, MD;  Location: [place];  Service: General;  Laterality: N/A;   COLONOSCOPY WITH PROPOFOL N/A 02/28/2019   Procedure: COLONOSCOPY WITH PROPOFOL;  Surgeon: Milus Banister, MD;  Location: [facility] ENDOSCOPY;  Service: Endoscopy;  Laterality: N/A;   ENTEROSCOPY N/A 03/30/2020   Procedure: ENTEROSCOPY;  Surgeon: Lavena Bullion, DO;  Location: [place];  Service: Gastroenterology;  Laterality: N/A;   ENTEROSCOPY N/A 02/10/2021   Procedure: ENTEROSCOPY;  Surgeon: Carol Ada, MD;  Location: [place];  Service: Endoscopy;  Laterality: N/A;   EP IMPLANTABLE DEVICE N/A 02/21/2016   Procedure: ICD Generator Changeout;  Surgeon: Deboraha Sprang, MD;  Location: [place] [facility];  Service: Cardiovascular;  Laterality: N/A;   ESOPHAGOGASTRODUODENOSCOPY (EGD) WITH PROPOFOL N/A 02/28/2019   Procedure: ESOPHAGOGASTRODUODENOSCOPY (EGD) WITH PROPOFOL;  Surgeon: Milus Banister, MD;  Location: [facility] ENDOSCOPY;  Service: Endoscopy;  Laterality: N/A;   GIVENS CAPSULE STUDY N/A 03/15/2019   Procedure: GIVENS CAPSULE STUDY;  Surgeon: Thornton Park, MD;  Location: [place];  Service: Gastroenterology;  Laterality: N/A;   HOT HEMOSTASIS N/A 03/30/2020   Procedure: HOT HEMOSTASIS (ARGON PLASMA COAGULATION/BICAP);  Surgeon: Lavena Bullion, DO;  Location: [facility] ENDOSCOPY;  Service: Gastroenterology;  Laterality: N/A;   HOT HEMOSTASIS N/A 02/10/2021   Procedure:  HOT HEMOSTASIS (ARGON PLASMA COAGULATION/BICAP);  Surgeon: Carol Ada, MD;  Location: [place];  Service: Endoscopy;  Laterality: N/A;   INSERT / REPLACE / REMOVE PACEMAKER  05-01-11   02-28-05-/05-04-10-ICD-MEDTRONIC MAXIMAL DR   JOINT REPLACEMENT     LAPAROSCOPIC CHOLECYSTECTOMY  11/11/2012   LAPAROSCOPIC LYSIS OF ADHESIONS N/A 11/11/2012   Procedure: LAPAROSCOPIC LYSIS OF ADHESIONS;  Surgeon: Imogene Burn. Georgette Dover, MD;  Location: [place];  Service: General;  Laterality: N/A;   NEPHRECTOMY Right 06/2001    S/P RENAL CELL CANCER   PERIPHERAL VASCULAR INTERVENTION Bilateral 02/12/2021   Procedure: PERIPHERAL VASCULAR INTERVENTION;  Surgeon: Waynetta Sandy, MD;  Location: [place] [facility];  Service: Cardiovascular;  Laterality: Bilateral;  Iliac artery stents   PRESSURE SENSOR/CARDIOMEMS N/A 02/03/2019   Procedure: PRESSURE SENSOR/CARDIOMEMS;  Surgeon: Larey Dresser, MD;  Location: [place] [facility];  Service: Cardiovascular;  Laterality: N/A;   RIGHT HEART CATH N/A 11/09/2018  Procedure: RIGHT HEART CATH;  Surgeon: Larey Dresser, MD;  Location: [place] [facility];  Service: Cardiovascular;  Laterality: N/A;   RIGHT HEART CATH N/A 03/08/2019   Procedure: RIGHT HEART CATH;  Surgeon: Larey Dresser, MD;  Location: [place] [facility];  Service: Cardiovascular;  Laterality: N/A;   SUBMUCOSAL TATTOO INJECTION  02/28/2019   Procedure: SUBMUCOSAL TATTOO INJECTION;  Surgeon: Milus Banister, MD;  Location: [facility] ENDOSCOPY;  Service: Endoscopy;;   TOTAL HIP ARTHROPLASTY Right 05/03/2011   Procedure: TOTAL HIP ARTHROPLASTY ANTERIOR APPROACH;  Surgeon: Mcarthur Rossetti;  Location: [facility];  Service: Orthopedics;  Laterality: Right;  Removal of Cannulated Screws Right Hip, Right Direct Anterior Hip Replacement   TOTAL HIP ARTHROPLASTY Left 10/09/2018   Procedure: TOTAL HIP ARTHROPLASTY ANTERIOR APPROACH;  Surgeon: Rod Can, MD;  Location: [place];  Service: Orthopedics;  Laterality:  Left;    Allergies  Allergen Reactions   Dilaudid [Hydromorphone] Other (See Comments)    Excessive Somnolence- Required Narcan   Fentanyl Other (See Comments)    Required Narcan when given with Dilaudid. Tolerated single dose when given during procedure 02/03/2019.    Codeine Nausea And Vomiting    Hallucinations    Morphine And Related Nausea And Vomiting    Hallucinations    Whole Blood Itching and Nausea And Vomiting    Pt and family stated pt will receive blood products but had a reaction requiring benadryl on a previous administration.    Sulfonamide Derivatives Other (See Comments)    Dry mouth    Current Outpatient Medications  Medication Sig Dispense Refill   acetaminophen (TYLENOL) 500 MG tablet Take 500-1,000 mg by mouth every 8 (eight) hours as needed for mild pain or headache.     Cholecalciferol (VITAMIN D) 50 MCG (2000 UT) CAPS Take 4,000 Units by mouth daily.     Cranberry (AZO CRANBERRY GUMMIES) 500 MG CHEW Chew 2,000 mg by mouth daily.     Cyanocobalamin (B-12 PO) Take 1 tablet by mouth daily.      ELIQUIS 2.5 MG TABS tablet TAKE 1 TABLET BY MOUTH TWICE A DAY 60 tablet 11   ezetimibe (ZETIA) 10 MG tablet Take 1 tablet (10 mg total) by mouth daily. 30 tablet 11   ferrous sulfate 325 (65 FE) MG tablet Take 1 tablet (325 mg total) by mouth 2 (two) times daily with a meal. 60 tablet 0   fluticasone (FLONASE) 50 MCG/ACT nasal spray Place 2 sprays into both nostrils daily as needed for allergies or rhinitis. 16 g 6   levothyroxine (SYNTHROID) 25 MCG tablet TAKE 1 TABLET BY MOUTH ONCE DAILY BEFOREBREAKFAST 30 tablet 3   losartan (COZAAR) 25 MG tablet Take 0.5 tablets (12.5 mg total) by mouth daily. 90 tablet 3   magnesium oxide (MAG-OX) 400 MG tablet Take 1 tablet (400 mg total) by mouth daily. 30 tablet 2   metolazone (ZAROXOLYN) 2.5 MG tablet Take 1 tablet (2.5 mg total) by mouth as directed. Take every other Thursday with your morning dose of Torsemide (Patient taking  differently: Take 2.5 mg by mouth as directed. Take Thursday with your morning dose of Torsemide) 60 tablet 2   ondansetron (ZOFRAN) 4 MG tablet Take 1 tablet (4 mg total) by mouth every 8 (eight) hours as needed for nausea or vomiting. 20 tablet 1   pantoprazole (PROTONIX) 40 MG tablet Take 1 tablet (40 mg total) by mouth 2 (two) times daily before a meal. 60 tablet 0   potassium  chloride SA (KLOR-CON M20) 20 MEQ tablet Take 2 tablets (40 mEq total) by mouth 2 (two) times daily. Take 1 extra tablet on the days you take metolazone. 360 tablet 3   rosuvastatin (CRESTOR) 40 MG tablet Take 1 tablet (40 mg total) by mouth daily. 90 tablet 3   torsemide (DEMADEX) 100 MG tablet Take 100 mg by mouth daily. 80 mg in the PM     vitamin C (ASCORBIC ACID) 250 MG tablet Take 500 mg by mouth daily.     No current facility-administered medications for this visit.    Family History  Problem Relation Age of Onset   Heart failure Mother    Early death Father        car accident   Breast cancer Maternal Aunt 82   Breast cancer Cousin    Breast cancer Other    Colon cancer Neg Hx     Social History   Socioeconomic History   Marital status: Widowed    Spouse name: Not on file   Number of children: 2   Years of education: high school   Highest education level: Not on file  Occupational History   Occupation: Retired    Fish farm manager: RETIRED  Tobacco Use   Smoking status: Former    Packs/day: 0.50    Years: 40.00    Pack years: 20.00    Types: Cigarettes    Quit date: 05/06/2001    Years since quitting: 20.4   Smokeless tobacco: Never  Vaping Use   Vaping Use: Never used  Substance and Sexual Activity   Alcohol use: No    Alcohol/week: 0.0 standard drinks   Drug use: No   Sexual activity: Not Currently  Other Topics Concern   Not on file  Social History Narrative   Would desire CPR   07/11/20   From: the area   Living: with great-grandson - [name] (2003)   Work: retired - clerical       Family: 2 children - [name] and [place] - 2 grand chlidren - 5 great grand children      Enjoys: spending time with friends - watch move, eat out, spend time      Exercise: walking around the house   Diet: not good, limits red meat, limits salt, low appetite      Safety   Seat belts: Yes    Guns: Yes  and secure   Safe in relationships: Yes    Social Determinants of Health   Financial Resource Strain: Not on file  Food Insecurity: Not on file  Transportation Needs: Not on file  Physical Activity: Not on file  Stress: Not on file  Social Connections: Not on file  Intimate Partner Violence: Not on file     REVIEW OF SYSTEMS:  *** '[X]'$  denotes positive finding, '[ ]'$  denotes negative finding Cardiac  Comments:  Chest pain or chest pressure:    Shortness of breath upon exertion:    Short of breath when lying flat:    Irregular heart rhythm:        Vascular    Pain in calf, thigh, or hip brought on by ambulation:    Pain in feet at night that wakes you up from your sleep:     Blood clot in your veins:    Leg swelling:         Pulmonary    Oxygen at home:    Productive cough:     Wheezing:  Neurologic    Sudden weakness in arms or legs:     Sudden numbness in arms or legs:     Sudden onset of difficulty speaking or slurred speech:    Temporary loss of vision in one eye:     Problems with dizziness:         Gastrointestinal    Blood in stool:     Vomited blood:         Genitourinary    Burning when urinating:     Blood in urine:        Psychiatric    Major depression:         Hematologic    Bleeding problems:    Problems with blood clotting too easily:        Skin    Rashes or ulcers:        Constitutional    Fever or chills:      PHYSICAL EXAMINATION:  ***  General:  WDWN in NAD; vital signs documented above Gait: Not observed HENT: WNL, normocephalic Pulmonary: normal non-labored breathing , without wheezing Cardiac: {Desc;  regular/irreg:14544} HR, {With/Without:20273} carotid bruit*** Abdomen: soft, NT, no masses; aortic pulse is *** palpable Skin: {With/Without:20273} rashes Vascular Exam/Pulses:  Right Left  Radial {Exam; arterial pulse strength 0-4:30167} {Exam; arterial pulse strength 0-4:30167}  Femoral {Exam; arterial pulse strength 0-4:30167} {Exam; arterial pulse strength 0-4:30167}  Popliteal {Exam; arterial pulse strength 0-4:30167} {Exam; arterial pulse strength 0-4:30167}  DP {Exam; arterial pulse strength 0-4:30167} {Exam; arterial pulse strength 0-4:30167}  PT {Exam; arterial pulse strength 0-4:30167} {Exam; arterial pulse strength 0-4:30167}  Peroneal *** ***   Extremities: {With/Without:20273} ischemic changes, {With/Without:20273} Gangrene , {With/Without:20273} cellulitis; {With/Without:20273} open wounds Musculoskeletal: no muscle wasting or atrophy  Neurologic: A&O X 3 Psychiatric:  The pt has {Desc; normal/abnormal:11317::"Normal"} affect.   Non-Invasive Vascular Imaging:   ABI's/TBI's on 09/27/2021: Right:  *** - Great toe pressure: *** Left:  *** - Great toe pressure: ***  Arterial duplex on 09/27/2021: ***  Previous ABI's/TBI's on 03/21/2022: Right:  0.83/0.35 - Great toe pressure: 53 Left:  1.07/0.51 - Great toe pressure:  78  Previous arterial duplex on 03/21/2021: Patent common iliac stents bilaterally with no evidence of restenosis.   ASSESSMENT/PLAN:: 88 y.o. female here for follow up for PAD with hx of CO2 Aortogram, LLE arteriogram with bilateral common iliac artery stenting (8 x 29 mm VBX) by Dr Donzetta Matters.   -*** -continue *** -pt will f/u in *** with ***.   Leontine Locket, [facility] [phone]  Clinic MD:   Virl Cagey

## 2021-09-25 LAB — CUP PACEART REMOTE DEVICE CHECK
Battery Remaining Longevity: 26 mo
Battery Voltage: 2.94 V
Brady Statistic AP VP Percent: 0 %
Brady Statistic AP VS Percent: 0 %
Brady Statistic AS VP Percent: 0 %
Brady Statistic AS VS Percent: 0 %
Brady Statistic RA Percent Paced: 0 %
Brady Statistic RV Percent Paced: 85.15 %
Date Time Interrogation Session: 20230517122021
HighPow Impedance: 40 Ohm
HighPow Impedance: 55 Ohm
Implantable Lead Implant Date: 20061026
Implantable Lead Implant Date: 20111230
Implantable Lead Location: 753859
Implantable Lead Location: 753860
Implantable Lead Model: 5076
Implantable Lead Model: 6947
Implantable Pulse Generator Implant Date: 20171018
Lead Channel Impedance Value: 361 Ohm
Lead Channel Impedance Value: 456 Ohm
Lead Channel Impedance Value: 475 Ohm
Lead Channel Pacing Threshold Amplitude: 0.5 V
Lead Channel Pacing Threshold Pulse Width: 0.4 ms
Lead Channel Sensing Intrinsic Amplitude: 0.25 mV
Lead Channel Sensing Intrinsic Amplitude: 0.375 mV
Lead Channel Sensing Intrinsic Amplitude: 9 mV
Lead Channel Sensing Intrinsic Amplitude: 9 mV
Lead Channel Setting Pacing Amplitude: 2.5 V
Lead Channel Setting Pacing Pulse Width: 0.4 ms
Lead Channel Setting Sensing Sensitivity: 0.3 mV

## 2021-09-26 ENCOUNTER — Ambulatory Visit (INDEPENDENT_AMBULATORY_CARE_PROVIDER_SITE_OTHER): Payer: PPO | Admitting: Family Medicine

## 2021-09-26 VITALS — BP 120/60 | HR 54 | Temp 97.1°F | Ht 66.0 in | Wt 149.1 lb

## 2021-09-26 DIAGNOSIS — N3001 Acute cystitis with hematuria: Secondary | ICD-10-CM

## 2021-09-26 DIAGNOSIS — D508 Other iron deficiency anemias: Secondary | ICD-10-CM

## 2021-09-26 DIAGNOSIS — I5032 Chronic diastolic (congestive) heart failure: Secondary | ICD-10-CM

## 2021-09-26 DIAGNOSIS — R300 Dysuria: Secondary | ICD-10-CM

## 2021-09-26 DIAGNOSIS — C44729 Squamous cell carcinoma of skin of left lower limb, including hip: Secondary | ICD-10-CM | POA: Diagnosis not present

## 2021-09-26 LAB — CBC
HCT: 36.4 % (ref 36.0–46.0)
Hemoglobin: 11.8 g/dL — ABNORMAL LOW (ref 12.0–15.0)
MCHC: 32.4 g/dL (ref 30.0–36.0)
MCV: 88.3 fl (ref 78.0–100.0)
Platelets: 177 10*3/uL (ref 150.0–400.0)
RBC: 4.12 Mil/uL (ref 3.87–5.11)
RDW: 15.8 % — ABNORMAL HIGH (ref 11.5–15.5)
WBC: 3.4 10*3/uL — ABNORMAL LOW (ref 4.0–10.5)

## 2021-09-26 LAB — POC URINALSYSI DIPSTICK (AUTOMATED)
Bilirubin, UA: NEGATIVE
Blood, UA: POSITIVE
Glucose, UA: NEGATIVE
Ketones, UA: NEGATIVE
Nitrite, UA: NEGATIVE
Protein, UA: NEGATIVE
Spec Grav, UA: 1.015 (ref 1.010–1.025)
Urobilinogen, UA: 0.2 E.U./dL
pH, UA: 6 (ref 5.0–8.0)

## 2021-09-26 LAB — BASIC METABOLIC PANEL
BUN: 42 mg/dL — ABNORMAL HIGH (ref 6–23)
CO2: 33 mEq/L — ABNORMAL HIGH (ref 19–32)
Calcium: 9.4 mg/dL (ref 8.4–10.5)
Chloride: 98 mEq/L (ref 96–112)
Creatinine, Ser: 1.5 mg/dL — ABNORMAL HIGH (ref 0.40–1.20)
GFR: 31.57 mL/min — ABNORMAL LOW (ref >60.00)
Glucose, Bld: 58 mg/dL — ABNORMAL LOW (ref 70–99)
Potassium: 3.8 mEq/L (ref 3.5–5.1)
Sodium: 141 mEq/L (ref 135–145)

## 2021-09-26 LAB — FERRITIN: Ferritin: 46.6 ng/mL (ref 10.0–291.0)

## 2021-09-26 MED ORDER — CEPHALEXIN 500 MG PO CAPS: 500.0000 mg | ORAL_CAPSULE | Freq: Two times a day (BID) | ORAL | 0 refills | Status: AC

## 2021-09-26 NOTE — Assessment & Plan Note (Signed)
Lesion has recurred. She was advised to contact dermatology. She will do so

## 2021-09-26 NOTE — Assessment & Plan Note (Signed)
Dyspnea is at baseline.  Follows with cardiology.  She she notes that her dizziness and fatigue seem to be worse after her weekly dose of metolazone 2.5 mg on Thursday.  Discussed that this may be contributing to the dizziness in the setting of her ongoing GI losses and anemia.  But also discussed importance of maintaining adequate fluid level.  At this time advised that she track her symptoms and know if they are worse within 1 to 2 days of taking her metolazone.  If this is persistent may need to readdress dosing with her heart specialist.  Continue torsemide 100 mg daily, potassium, losartan 25 mg.

## 2021-09-26 NOTE — Patient Instructions (Addendum)
Schedule an appointment with GI  #Referral I have placed a referral to a specialist for you. You should receive a phone call from the specialty office. Make sure your voicemail is not full and that if you are able to answer your phone to unknown or new numbers.   It may take up to 2 weeks to hear about the referral. If you do not hear anything in 2 weeks, please call our office and ask to speak with the referral coordinator.   [address], [place], [place] [address] Phone: [phone]  Schedule an appointment with the skin doctor  Urine  - start antibiotics   Dizziness - monitor to see if worse after metolazone - update me and your heart doctor  - go to GI

## 2021-09-26 NOTE — Progress Notes (Signed)
Subjective:     [name] is a 88 y.o. female presenting for Dizziness, Dysuria, and Fatigue     Dizziness  Dysuria    #Dysuria - some mild burning - off and on for 4 days - mild - hard to judge frequency and urgency - due to fluid pill   #Dizziness/fatigue - chronic for months - but will come and go and be off and one - seems to be worse when she takes her metolazone -  was taking 2 times a week but now just once a week - was feeling her normal level of dizziness and then when she took the medicine her dizziness got significantly worse - has not reached out to GI for appointment - got worn out and just hasn't scheduled with - almost went to the ER on Saturday - now feeling slightly better - has to sit down occasionally    Review of Systems  Genitourinary:  Positive for dysuria.  Neurological:  Positive for dizziness.    Social History   Tobacco Use  Smoking Status Former   Packs/day: 0.50   Years: 40.00   Pack years: 20.00   Types: Cigarettes   Quit date: 05/06/2001   Years since quitting: 20.4  Smokeless Tobacco Never        Objective:    BP Readings from Last 3 Encounters:  09/26/21 120/60  09/19/21 (!) 132/58  08/28/21 (!) 143/67   Wt Readings from Last 3 Encounters:  09/26/21 149 lb 1 oz (67.6 kg)  09/24/21 141 lb (64 kg)  09/19/21 152 lb (68.9 kg)    BP 120/60   Pulse (!) 54   Temp (!) 97.1 F (36.2 C) (Temporal)   Ht '5\' 6"'$  (1.676 m)   Wt 149 lb 1 oz (67.6 kg)   SpO2 100%   BMI 24.06 kg/m    Physical Exam Constitutional:      General: She is not in acute distress.    Appearance: She is well-developed. She is not diaphoretic.  HENT:     Right Ear: External ear normal.     Left Ear: External ear normal.     Nose: Nose normal.  Eyes:     Conjunctiva/sclera: Conjunctivae normal.  Cardiovascular:     Rate and Rhythm: Normal rate and regular rhythm.     Heart sounds: Murmur heard.  Pulmonary:     Effort: Pulmonary effort  is normal. No respiratory distress.     Breath sounds: Normal breath sounds. No wheezing or rales.  Abdominal:     General: Abdomen is flat. Bowel sounds are normal. There is no distension.     Palpations: Abdomen is soft.     Tenderness: There is abdominal tenderness. There is no right CVA tenderness, left CVA tenderness, guarding or rebound.  Musculoskeletal:     Cervical back: Neck supple.  Skin:    General: Skin is warm and dry.     Capillary Refill: Capillary refill takes less than 2 seconds.  Neurological:     Mental Status: She is alert. Mental status is at baseline.  Psychiatric:        Mood and Affect: Mood normal.        Behavior: Behavior normal.          Assessment & Plan:   Problem List Items Addressed This Visit       Cardiovascular and Mediastinum   Chronic diastolic CHF (congestive heart failure) (HCC)    Dyspnea is at baseline.  Follows with cardiology.  She she notes that her dizziness and fatigue seem to be worse after her weekly dose of metolazone 2.5 mg on Thursday.  Discussed that this may be contributing to the dizziness in the setting of her ongoing GI losses and anemia.  But also discussed importance of maintaining adequate fluid level.  At this time advised that she track her symptoms and know if they are worse within 1 to 2 days of taking her metolazone.  If this is persistent may need to readdress dosing with her heart specialist.  Continue torsemide 100 mg daily, potassium, losartan 25 mg.        Relevant Orders   Basic metabolic panel     Musculoskeletal and Integument   Squamous cell carcinoma of foot, left    Lesion has recurred. She was advised to contact dermatology. She will do so       Relevant Medications   cephALEXin (KEFLEX) 500 MG capsule     Other   Iron deficiency anemia    Has not seen GI. Continues to have Black stool. Will repeat CBC today.  Discussed that until her GI bleeding is addressed she will likely continue to feel  fatigued and dizziness. Urgent referral placed to do persistence. She was also provided the number for GI       Relevant Orders   CBC   Ferritin   Ambulatory referral to Gastroenterology   Other Visit Diagnoses     Acute cystitis with hematuria    -  Primary   Relevant Medications   cephALEXin (KEFLEX) 500 MG capsule   Dysuria       Relevant Medications   cephALEXin (KEFLEX) 500 MG capsule   Other Relevant Orders   POCT Urinalysis Dipstick (Automated) (Completed)      UA with LE. Discussed given worsening symptoms which may be 2/2 to UTI will treat. F/u UCx  Return if symptoms worsen or fail to improve.  Lesleigh Noe, MD

## 2021-09-26 NOTE — Assessment & Plan Note (Signed)
Has not seen GI. Continues to have Black stool. Will repeat CBC today.  Discussed that until her GI bleeding is addressed she will likely continue to feel fatigued and dizziness. Urgent referral placed to do persistence. She was also provided the number for GI

## 2021-09-27 ENCOUNTER — Encounter (HOSPITAL_COMMUNITY): Payer: PPO

## 2021-09-27 ENCOUNTER — Ambulatory Visit: Payer: PPO

## 2021-10-04 ENCOUNTER — Other Ambulatory Visit (HOSPITAL_COMMUNITY): Payer: Self-pay | Admitting: Cardiology

## 2021-10-10 NOTE — Progress Notes (Signed)
Remote ICD transmission.   

## 2021-10-11 ENCOUNTER — Ambulatory Visit (INDEPENDENT_AMBULATORY_CARE_PROVIDER_SITE_OTHER): Payer: PPO | Admitting: Family Medicine

## 2021-10-11 ENCOUNTER — Ambulatory Visit (INDEPENDENT_AMBULATORY_CARE_PROVIDER_SITE_OTHER)
Admission: RE | Admit: 2021-10-11 | Discharge: 2021-10-11 | Disposition: A | Discharge status: Home / Self Care | Payer: PPO | Source: Ambulatory Visit | Attending: Family Medicine | Admitting: Family Medicine

## 2021-10-11 VITALS — BP 120/60 | HR 60 | Temp 97.6°F | Ht 66.0 in | Wt 150.0 lb

## 2021-10-11 DIAGNOSIS — M545 Low back pain, unspecified: Secondary | ICD-10-CM | POA: Insufficient documentation

## 2021-10-11 DIAGNOSIS — M810 Age-related osteoporosis without current pathological fracture: Secondary | ICD-10-CM | POA: Insufficient documentation

## 2021-10-11 DIAGNOSIS — C44729 Squamous cell carcinoma of skin of left lower limb, including hip: Secondary | ICD-10-CM | POA: Diagnosis not present

## 2021-10-11 NOTE — Assessment & Plan Note (Signed)
Recurrence. Pt has not followed up with dermatology, again stressed the importance of this.

## 2021-10-11 NOTE — Assessment & Plan Note (Signed)
Possible compression fracture on XR. Pain is improving and controlled on prn NSAID and mostly tylenol and salon pas. Will f/u final read.

## 2021-10-11 NOTE — Progress Notes (Signed)
Subjective:     [name] is a 88 y.o. female presenting for Back Pain (Lumbar x 6 days since lifting some water )     Back Pain This is a new problem. The current episode started in the past 7 days. The problem has been gradually improving since onset. The pain is present in the lumbar spine. The pain is severe. The symptoms are aggravated by standing and bending. Pertinent negatives include no bladder incontinence, bowel incontinence, leg pain, numbness, tingling or weakness. Treatments tried: salon pas, tylenol. The treatment provided mild relief.     Review of Systems  Gastrointestinal:  Negative for bowel incontinence.  Genitourinary:  Negative for bladder incontinence.  Musculoskeletal:  Positive for back pain.  Neurological:  Negative for tingling, weakness and numbness.     Social History   Tobacco Use  Smoking Status Former   Packs/day: 0.50   Years: 40.00   Total pack years: 20.00   Types: Cigarettes   Quit date: 05/06/2001   Years since quitting: 20.4  Smokeless Tobacco Never        Objective:    BP Readings from Last 3 Encounters:  10/11/21 120/60  09/26/21 120/60  09/19/21 (!) 132/58   Wt Readings from Last 3 Encounters:  10/11/21 150 lb (68 kg)  09/26/21 149 lb 1 oz (67.6 kg)  09/24/21 141 lb (64 kg)    BP 120/60   Pulse 60   Temp 97.6 F (36.4 C) (Temporal)   Ht '5\' 6"'$  (1.676 m)   Wt 150 lb (68 kg)   SpO2 96%   BMI 24.21 kg/m    Physical Exam Constitutional:      General: She is not in acute distress.    Appearance: She is well-developed. She is not diaphoretic.  HENT:     Right Ear: External ear normal.     Left Ear: External ear normal.     Nose: Nose normal.  Eyes:     Conjunctiva/sclera: Conjunctivae normal.  Cardiovascular:     Rate and Rhythm: Normal rate.  Pulmonary:     Effort: Pulmonary effort is normal.  Musculoskeletal:     Cervical back: Neck supple.     Comments: Back Inspection: no  abnormalities Palpation: No spinous process ttp, no Paraspinous ttp ROM: normal flexion, extension, rotation, and lateral flexion Strength: Normal bilateral hip flexion/extension, knee flexion/extension, dorsiflexion/extension - pain with testing of strength due to chronic leg pain Straight leg raise - negative b/l    Skin:    General: Skin is warm and dry.     Capillary Refill: Capillary refill takes less than 2 seconds.     Comments: Left foot with growing skin lesion  Neurological:     Mental Status: She is alert. Mental status is at baseline.  Psychiatric:        Mood and Affect: Mood normal.        Behavior: Behavior normal.     Lumbar Spine XR (my read): multiple clips from prior procedures. Appears to have a compression fracture.       Assessment & Plan:   Problem List Items Addressed This Visit       Musculoskeletal and Integument   Squamous cell carcinoma of foot, left    Recurrence. Pt has not followed up with dermatology, again stressed the importance of this.       Osteoporosis    No dexa since 2010. Given back pain XR done to assess for compression fracture which does  appear present. Will f/u final read to assess age. And may need to consider repeat DEXA and osteoporosis management.       Relevant Orders   DG Lumbar Spine Complete     Other   Acute bilateral low back pain without sciatica - Primary    Possible compression fracture on XR. Pain is improving and controlled on prn NSAID and mostly tylenol and salon pas. Will f/u final read.       Relevant Orders   DG Lumbar Spine Complete     Return if symptoms worsen or fail to improve.  Lesleigh Noe, MD

## 2021-10-11 NOTE — Patient Instructions (Signed)
Back pain - get your X-ray done today - continue pain medicines as you are doing - update if not continuing to improve and we can do physical

## 2021-10-11 NOTE — Assessment & Plan Note (Addendum)
No dexa since 2010. Given back pain XR done to assess for compression fracture which does appear present. Will f/u final read to assess age. And may need to consider repeat DEXA and osteoporosis management.

## 2021-10-17 DIAGNOSIS — M6281 Muscle weakness (generalized): Secondary | ICD-10-CM | POA: Diagnosis not present

## 2021-10-17 DIAGNOSIS — R2689 Other abnormalities of gait and mobility: Secondary | ICD-10-CM | POA: Diagnosis not present

## 2021-10-17 DIAGNOSIS — J90 Pleural effusion, not elsewhere classified: Secondary | ICD-10-CM | POA: Diagnosis not present

## 2021-10-17 DIAGNOSIS — Z4789 Encounter for other orthopedic aftercare: Secondary | ICD-10-CM | POA: Diagnosis not present

## 2021-10-17 DIAGNOSIS — I5033 Acute on chronic diastolic (congestive) heart failure: Secondary | ICD-10-CM | POA: Diagnosis not present

## 2021-10-17 DIAGNOSIS — J449 Chronic obstructive pulmonary disease, unspecified: Secondary | ICD-10-CM | POA: Diagnosis not present

## 2021-10-22 ENCOUNTER — Encounter: Payer: Self-pay | Admitting: Gastroenterology

## 2021-10-22 ENCOUNTER — Ambulatory Visit (INDEPENDENT_AMBULATORY_CARE_PROVIDER_SITE_OTHER): Payer: PPO | Admitting: Gastroenterology

## 2021-10-22 VITALS — BP 140/68 | HR 60 | Ht 66.5 in | Wt 148.0 lb

## 2021-10-22 DIAGNOSIS — I5032 Chronic diastolic (congestive) heart failure: Secondary | ICD-10-CM

## 2021-10-22 DIAGNOSIS — Z7901 Long term (current) use of anticoagulants: Secondary | ICD-10-CM

## 2021-10-22 DIAGNOSIS — I4811 Longstanding persistent atrial fibrillation: Secondary | ICD-10-CM

## 2021-10-22 DIAGNOSIS — I350 Nonrheumatic aortic (valve) stenosis: Secondary | ICD-10-CM

## 2021-10-22 DIAGNOSIS — K5520 Angiodysplasia of colon without hemorrhage: Secondary | ICD-10-CM

## 2021-10-22 DIAGNOSIS — K921 Melena: Secondary | ICD-10-CM | POA: Diagnosis not present

## 2021-10-22 DIAGNOSIS — D638 Anemia in other chronic diseases classified elsewhere: Secondary | ICD-10-CM | POA: Diagnosis not present

## 2021-10-22 NOTE — Progress Notes (Signed)
Chief Complaint:    Anemia, fatigue, dark stool  GI History: 88 year old female with a history of A-fib (on Eliquis),  ischemic colitis (2020),  cardiomyopathy s/p ICD, chronic diastolic CHF, COPD, hypothyroidism, TIA, CKD 3, renal cell cancer s/p right nephrectomy, peripheral artery disease , cholecystectomy, diverticulosis.  - 02/2019: Inpatient admission for acute on chronic anemia on Coumadin.  EGD with mild gastritis.  Colonoscopy with ischemic colitis.  VCE with small bowel AVMs in duodenum and small streak of blood in small bowel.  Treated with PRBC transfusion and IV iron. - 03/2020: Hospital admission for acute on chronic anemia and melena with supratherapeutic INR.  Push enteroscopy with 1 duodenal AVM, 2 jejunal AVMs (1 actively bleeding) treated with APC - 02/2021: Hospital admission for acute on chronic anemia on Eliquis along with left foot cellulitis with sepsis and COPD exacerbation. Push enteroscopy with a few gastric AVMs treated with APC, for duodenal bulb/D2 AVMs (actively bleeding) treated with APC, single proximal jejunal AVM treated with APC.  HPI:     Patient is a 88 y.o. female presenting to the Gastroenterology Clinic for follow-up and to discuss fatigue and dark stools.  History of bleeding small bowel AVMs with hospitalizations for acute on chronic anemia as outlined above.  Does report having dark stools recently, but also in the setting of continued oral iron.  No nausea/vomiting, abdominal pain, hematochezia.  Reviewed labs from 08/28/2021: - H/H 11.1/35 (stable, chronic) - Ferritin 104, iron 66, TIBC 287, sat 23% - Normal B12, folate  Reviewed labs from 09/26/2021: - H/H 11.8/36.4 (stable, chronic) WBC 3.4 (chronic), PLT 177 - Ferritin 46 - BUN/creatinine at baseline, otherwise unremarkable BMP  Was last seen in the cardiology clinic on 09/19/2021.  Last TTE 2/23 with EF 65-70%, moderate to severe TR, dilated IVC, moderate aortic stenosis, moderate RV  enlargement.  Was last seen in the Hematology clinic 08/28/2021.  Recommended continued OTC iron supplement with follow-up in 2-3 months. Last iron infusion was 07/18/2021.  Last appointment with PCM was 09/26/2021.   Review of systems:     No chest pain, no SOB, no fevers, no urinary sx   Past Medical History:  Diagnosis Date   Actinic keratosis 01/17/2015   R forearm   Adjustment disorder with anxiety    Anemia    Anginal pain ([place])    Arthritis    "some in my hands" (11/11/2012)   Atrial fibrillation (H[facility])    Atypical mole 03/25/2018   L forearm - severe   Automatic implantable cardioverter-defibrillator in situ    Avascular necrosis of hip ([place]) 05/03/2011   Blood in stool    Carotid artery stenosis 09/2007   a. 09/2007: 60-79% bilateral (stable); b. 10/2008: 40-59% R 60-79%    Chest pain, unspecified    Chronic airway obstruction, not elsewhere classified    Chronic bronchitis ([place])    "get it some; not q year" (11/11/2012)   Chronic diastolic CHF (congestive heart failure) ([place]) 06/04/2013   Chronic kidney disease, unspecified    Coronary artery disease    non-obstructive by 2006 cath   Displaced fracture of left femoral neck ([place]) 10/09/2018   Frequent UTI    "get them a couple times/yr" (11/11/2012)   GI bleed 03/28/2020   Hemorrhage of rectum and anus    High cholesterol    History of blood transfusion 04/2011   "after hip OR" (11/11/2012)   Hx of renal cell cancer    Hypertension 05/20/2011   Hypertr obst cardiomyop  Hypotension, unspecified    cardiac cath 2006..nonobstructive CAD 30-40s lesions.Marland KitchenETT 1/09 nondiagnostic due to poor HR response..Right Renal Cancer 2003   Long term (current) use of anticoagulants    Malignant neoplasm of kidney, except pelvis    Osteoarthritis of right hip    Other and unspecified coagulation defects    PONV (postoperative nausea and vomiting)    Presence of permanent cardiac pacemaker    RECTAL BLEEDING 10/13/2009   Qualifier:  Diagnosis of  By: Chester Holstein NP, Paula     Renal cancer ([place]) 06/2001   Right   Secondary cardiomyopathy, unspecified    Sinus bradycardia    Squamous cell carcinoma of skin 01/17/2015   R lat wrist   Squamous cell carcinoma of skin 01/29/2018   R post upper leg - superficially invasive   Squamous cell carcinoma of skin 03/25/2018   L lat foot   Squamous cell carcinoma of skin 11/09/2020   left lat foot - EDC 01/01/21, recurrent 01/31/21 - MOHs 02/22/21   Urge incontinence     Patient's surgical history, family medical history, social history, medications and allergies were all reviewed in Epic    Current Outpatient Medications  Medication Sig Dispense Refill   acetaminophen (TYLENOL) 500 MG tablet Take 500-1,000 mg by mouth every 8 (eight) hours as needed for mild pain or headache.     Cholecalciferol (VITAMIN D) 50 MCG (2000 UT) CAPS Take 4,000 Units by mouth daily.     Cranberry (AZO CRANBERRY GUMMIES) 500 MG CHEW Chew 2,000 mg by mouth daily.     Cyanocobalamin (B-12 PO) Take 1 tablet by mouth daily.      ELIQUIS 2.5 MG TABS tablet TAKE 1 TABLET BY MOUTH TWICE A DAY 60 tablet 11   ezetimibe (ZETIA) 10 MG tablet Take 1 tablet (10 mg total) by mouth daily. 30 tablet 11   ferrous sulfate 325 (65 FE) MG tablet Take 1 tablet (325 mg total) by mouth 2 (two) times daily with a meal. 60 tablet 0   fluticasone (FLONASE) 50 MCG/ACT nasal spray Place 2 sprays into both nostrils daily as needed for allergies or rhinitis. 16 g 6   levothyroxine (SYNTHROID) 25 MCG tablet TAKE 1 TABLET BY MOUTH ONCE DAILY BEFOREBREAKFAST 30 tablet 3   losartan (COZAAR) 25 MG tablet Take 0.5 tablets (12.5 mg total) by mouth daily. 90 tablet 3   magnesium oxide (MAG-OX) 400 MG tablet Take 1 tablet (400 mg total) by mouth daily. 30 tablet 2   metolazone (ZAROXOLYN) 2.5 MG tablet Take 1 tablet (2.5 mg total) by mouth as directed. Take every other Thursday with your morning dose of Torsemide (Patient taking differently:  Take 2.5 mg by mouth once a week. Take Thursday with your morning dose of Torsemide) 60 tablet 2   ondansetron (ZOFRAN) 4 MG tablet Take 1 tablet (4 mg total) by mouth every 8 (eight) hours as needed for nausea or vomiting. 20 tablet 1   pantoprazole (PROTONIX) 40 MG tablet Take 1 tablet (40 mg total) by mouth 2 (two) times daily before a meal. 60 tablet 0   potassium chloride SA (KLOR-CON M) 20 MEQ tablet TAKE 2 TABLETS (40 MEQ) BY MOUTH 2 TIMESDAILY. TAKE 1 EXTRA TABLET ON THE DAYS YOU TAKE METOLAZONE. 360 tablet 3   rosuvastatin (CRESTOR) 40 MG tablet Take 1 tablet (40 mg total) by mouth daily. 90 tablet 3   torsemide (DEMADEX) 100 MG tablet Take 100 mg by mouth daily. 80 mg in the PM  vitamin C (ASCORBIC ACID) 250 MG tablet Take 500 mg by mouth daily.     No current facility-administered medications for this visit.    Physical Exam:     BP 140/68   Pulse 60   Ht 5' 6.5" (1.689 m)   Wt 148 lb (67.1 kg) Comment: verbal says she has a lot of fluids in legs and chest  BMI 23.53 kg/m   GENERAL:  Pleasant female in NAD, sitting in wheelchair PSYCH: : Cooperative, normal affect NEURO: Alert and oriented x 3, well conversive   IMPRESSION and PLAN:    1) Small bowel AVMs 2) Anemia of chronic disease 3) Dark stools  Had a long conversation today with [place] and her niece.  Discussed her history of bleeding small bowel AVMs, previous hospitalizations for acute on chronic anemia, along with recent dark stools.  Thankfully recent CBC largely stable iron deficiency.  Based on her overall health, decided on the following plan:  - VCE to evaluate for active bleeding along with burden of small bowel AVM disease.  Clear liquid diet day prior then NPO at MN.  Stop oral iron 7 days prior - Discussed the possibility of having AVMs that are out of reach of enteroscopy.  Discussed the possibility of device assisted enteroscopy (i.e. single balloon enteroscopy) depending on VCE findings -  Discussed possibly using octreotide depending on EGD findings - RTC after VCE -Continue regular CBC and iron checks  4) Chronic diastolic CHF 5) Atrial fibrillation (on Eliquis) 6) Cardiomyopathy with ICD 7) COPD 8) Aortic stenosis -Discussed elevated periprocedural risks due to underlying comorbidities as above, will continue to develop plan with her pending on VCE findings and stability of CBC  I spent 40 minutes of time, including in depth chart review, independent review of results as outlined above, communicating results with the patient directly, face-to-face time with the patient, coordinating care, ordering studies and medications as appropriate, and documentation.            [place] ,DO, FACG 10/22/2021, 11:14 AM

## 2021-10-22 NOTE — Patient Instructions (Addendum)
You have been schedule for Video Capsule Endoscopy on 11/14/21 at 8:30 am.  If you are age 86 or older, your body mass index should be between 23-30. Your Body mass index is 23.53 kg/m. If this is out of the aforementioned range listed, please consider follow up with your Primary Care Provider.  ________________________________________________________  The [facility] providers would like to encourage you to use [facility] to communicate with providers for non-urgent requests or questions.  Due to long hold times on the telephone, sending your provider a message by [facility] may be a faster and more efficient way to get a response.  Please allow 48 business hours for a response.  Please remember that this is for non-urgent requests.  _______________________________________________________  Due to recent changes in healthcare laws, you may see the results of your imaging and laboratory studies on MyChart before your provider has had a chance to review them.  We understand that in some cases there may be results that are confusing or concerning to you. Not all laboratory results come back in the same time frame and the provider may be waiting for multiple results in order to interpret others.  Please give [place] 48 hours in order for your provider to thoroughly review all the results before contacting the office for clarification of your results.     Thank you for choosing me and [facility].  Vito Cirigliano, D.O.

## 2021-11-05 ENCOUNTER — Ambulatory Visit: Payer: PPO

## 2021-11-05 ENCOUNTER — Encounter (HOSPITAL_COMMUNITY): Payer: PPO

## 2021-11-08 ENCOUNTER — Ambulatory Visit (INDEPENDENT_AMBULATORY_CARE_PROVIDER_SITE_OTHER): Payer: PPO | Admitting: Family Medicine

## 2021-11-08 ENCOUNTER — Encounter: Payer: Self-pay | Admitting: Family Medicine

## 2021-11-08 VITALS — BP 124/74 | HR 60 | Temp 97.9°F | Ht 66.5 in | Wt 152.2 lb

## 2021-11-08 DIAGNOSIS — R300 Dysuria: Secondary | ICD-10-CM

## 2021-11-08 DIAGNOSIS — R5383 Other fatigue: Secondary | ICD-10-CM

## 2021-11-08 DIAGNOSIS — R195 Other fecal abnormalities: Secondary | ICD-10-CM

## 2021-11-08 LAB — CBC WITH DIFFERENTIAL/PLATELET
Basophils Absolute: 0 10*3/uL (ref 0.0–0.1)
Basophils Relative: 0.5 % (ref 0.0–3.0)
Eosinophils Absolute: 0 10*3/uL (ref 0.0–0.7)
Eosinophils Relative: 0.6 % (ref 0.0–5.0)
HCT: 33.6 % — ABNORMAL LOW (ref 36.0–46.0)
Hemoglobin: 11.1 g/dL — ABNORMAL LOW (ref 12.0–15.0)
Lymphocytes Relative: 12.1 % (ref 12.0–46.0)
Lymphs Abs: 0.4 10*3/uL — ABNORMAL LOW (ref 0.7–4.0)
MCHC: 33.1 g/dL (ref 30.0–36.0)
MCV: 86.7 fl (ref 78.0–100.0)
Monocytes Absolute: 0.4 10*3/uL (ref 0.1–1.0)
Monocytes Relative: 10.7 % (ref 3.0–12.0)
Neutro Abs: 2.5 10*3/uL (ref 1.4–7.7)
Neutrophils Relative %: 76.1 % (ref 43.0–77.0)
Platelets: 164 10*3/uL (ref 150.0–400.0)
RBC: 3.88 Mil/uL (ref 3.87–5.11)
RDW: 15.9 % — ABNORMAL HIGH (ref 11.5–15.5)
WBC: 3.3 10*3/uL — ABNORMAL LOW (ref 4.0–10.5)

## 2021-11-08 LAB — POC URINALSYSI DIPSTICK (AUTOMATED)
Bilirubin, UA: NEGATIVE
Glucose, UA: NEGATIVE
Ketones, UA: NEGATIVE
Leukocytes, UA: NEGATIVE
Nitrite, UA: NEGATIVE
Protein, UA: NEGATIVE
Spec Grav, UA: 1.01 (ref 1.010–1.025)
Urobilinogen, UA: 0.2 E.U./dL
pH, UA: 6 (ref 5.0–8.0)

## 2021-11-08 LAB — TSH: TSH: 1.83 u[IU]/mL (ref 0.35–5.50)

## 2021-11-08 LAB — VITAMIN B12: Vitamin B-12: 992 pg/mL — ABNORMAL HIGH (ref 211–911)

## 2021-11-08 LAB — VITAMIN D 25 HYDROXY (VIT D DEFICIENCY, FRACTURES): VITD: 56.32 ng/mL (ref 30.00–100.00)

## 2021-11-08 NOTE — Patient Instructions (Addendum)
Please stop at the lab to have labs drawn. Increase water intake.  Go to ER if symptoms worsening.

## 2021-11-08 NOTE — Assessment & Plan Note (Signed)
Acute, mild  Patient's urinalysis is clear today.  Most likely her symptoms are not due to kidney stone or infection.  I encouraged her to avoid foods and triggers of urinary irritation.  She will increase water intake.

## 2021-11-08 NOTE — Progress Notes (Signed)
Patient ID: LANDRA HOWZE, female    DOB: [birth year], 88 y.o.   MRN: [id number]  This visit was conducted in person.  BP 124/74   Pulse 60   Temp 97.9 F (36.6 C) (Oral)   Ht 5' 6.5" (1.689 m)   Wt 152 lb 3 oz (69 kg)   SpO2 98%   BMI 24.20 kg/m    CC:  Chief Complaint  Patient presents with   Burning with Urination   Dizziness    Hx of GI bleed.  Has endoscopy scheduled for next Wednesday   Fatigue         Subjective:   HPI: CHRISTEL BAI is a 88 y.o. female presenting on 11/08/2021 for Burning with Urination, Dizziness (Hx of GI bleed.  Has endoscopy scheduled for next Wednesday), and Fatigue (/)   She has noted 3 days of slight burning with urination, not bad. No urgency, no change in frequency. No blood in urine. Mild low abd pain.   He biggest issue is fatigue and dizziness in last 9 days.. severe. Has been dar stools for several weeks. No brbpr.  On ferrous sulfate daily.  Hx of GI bleed, AVM of small bowel.. seeing GI.. plan capsule endoscopy   Walking with cane.   No falls, no head injury.   She is on Eliquis blood thinner for atrial fibrillation.    Follow[facility] at [facility].     Relevant past medical, surgical, family and social history reviewed and updated as indicated. Interim medical history since our last visit reviewed. Allergies and medications reviewed and updated. Outpatient Medications Prior to Visit  Medication Sig Dispense Refill   acetaminophen (TYLENOL) 500 MG tablet Take 500-1,000 mg by mouth every 8 (eight) hours as needed for mild pain or headache.     Cholecalciferol (VITAMIN D) 50 MCG (2000 UT) CAPS Take 4,000 Units by mouth daily.     Cranberry (AZO CRANBERRY GUMMIES) 500 MG CHEW Chew 2,000 mg by mouth daily.     Cyanocobalamin (B-12 PO) Take 1 tablet by mouth daily.      ELIQUIS 2.5 MG TABS tablet TAKE 1 TABLET BY MOUTH TWICE A DAY 60 tablet 11   ezetimibe (ZETIA) 10 MG tablet Take 1 tablet (10 mg total) by mouth daily. 30  tablet 11   ferrous sulfate 325 (65 FE) MG tablet Take 1 tablet (325 mg total) by mouth 2 (two) times daily with a meal. 60 tablet 0   fluticasone (FLONASE) 50 MCG/ACT nasal spray Place 2 sprays into both nostrils daily as needed for allergies or rhinitis. 16 g 6   levothyroxine (SYNTHROID) 25 MCG tablet TAKE 1 TABLET BY MOUTH ONCE DAILY BEFOREBREAKFAST 30 tablet 3   losartan (COZAAR) 25 MG tablet Take 0.5 tablets (12.5 mg total) by mouth daily. 90 tablet 3   magnesium oxide (MAG-OX) 400 MG tablet Take 1 tablet (400 mg total) by mouth daily. 30 tablet 2   metolazone (ZAROXOLYN) 2.5 MG tablet Take 1 tablet (2.5 mg total) by mouth as directed. Take every other Thursday with your morning dose of Torsemide (Patient taking differently: Take 2.5 mg by mouth once a week. Take Thursday with your morning dose of Torsemide) 60 tablet 2   ondansetron (ZOFRAN) 4 MG tablet Take 1 tablet (4 mg total) by mouth every 8 (eight) hours as needed for nausea or vomiting. 20 tablet 1   pantoprazole (PROTONIX) 40 MG tablet Take 1 tablet (40 mg total) by mouth 2 (two)  times daily before a meal. 60 tablet 0   potassium chloride SA (KLOR-CON M) 20 MEQ tablet TAKE 2 TABLETS (40 MEQ) BY MOUTH 2 TIMESDAILY. TAKE 1 EXTRA TABLET ON THE DAYS YOU TAKE METOLAZONE. 360 tablet 3   rosuvastatin (CRESTOR) 40 MG tablet Take 1 tablet (40 mg total) by mouth daily. 90 tablet 3   torsemide (DEMADEX) 100 MG tablet Take 100 mg by mouth daily. 80 mg in the PM     vitamin C (ASCORBIC ACID) 250 MG tablet Take 500 mg by mouth daily.     No facility-administered medications prior to visit.     Per HPI unless specifically indicated in ROS section below Review of Systems  Constitutional:  Negative for fatigue and fever.  HENT:  Negative for congestion.   Eyes:  Negative for pain.  Respiratory:  Negative for cough and shortness of breath.   Cardiovascular:  Negative for chest pain, palpitations and leg swelling.  Gastrointestinal:  Negative for  abdominal pain.  Genitourinary:  Negative for dysuria and vaginal bleeding.  Musculoskeletal:  Negative for back pain.  Neurological:  Negative for syncope, light-headedness and headaches.  Psychiatric/Behavioral:  Negative for dysphoric mood.    Objective:  BP 124/74   Pulse 60   Temp 97.9 F (36.6 C) (Oral)   Ht 5' 6.5" (1.689 m)   Wt 152 lb 3 oz (69 kg)   SpO2 98%   BMI 24.20 kg/m   Wt Readings from Last 3 Encounters:  11/08/21 152 lb 3 oz (69 kg)  10/22/21 148 lb (67.1 kg)  10/11/21 150 lb (68 kg)      Physical Exam Constitutional:      General: She is not in acute distress.    Appearance: Normal appearance. She is well-developed. She is not ill-appearing or toxic-appearing.  HENT:     Head: Normocephalic.     Right Ear: Hearing, tympanic membrane, ear canal and external ear normal. Tympanic membrane is not erythematous, retracted or bulging.     Left Ear: Hearing, tympanic membrane, ear canal and external ear normal. Tympanic membrane is not erythematous, retracted or bulging.     Nose: No mucosal edema or rhinorrhea.     Right Sinus: No maxillary sinus tenderness or frontal sinus tenderness.     Left Sinus: No maxillary sinus tenderness or frontal sinus tenderness.     Mouth/Throat:     Pharynx: Uvula midline.  Eyes:     General: Lids are normal. Lids are everted, no foreign bodies appreciated.     Conjunctiva/sclera: Conjunctivae normal.     Pupils: Pupils are equal, round, and reactive to light.  Neck:     Thyroid: No thyroid mass or thyromegaly.     Vascular: No carotid bruit.     Trachea: Trachea normal.  Cardiovascular:     Rate and Rhythm: Normal rate and regular rhythm.     Pulses: Normal pulses.     Heart sounds: Normal heart sounds, S1 normal and S2 normal. No murmur heard.    No friction rub. No gallop.  Pulmonary:     Effort: Pulmonary effort is normal. No tachypnea or respiratory distress.     Breath sounds: Normal breath sounds. No decreased breath  sounds, wheezing, rhonchi or rales.  Abdominal:     General: Bowel sounds are normal.     Palpations: Abdomen is soft.     Tenderness: There is no abdominal tenderness.  Musculoskeletal:     Cervical back: Normal range of motion and  neck supple.  Skin:    General: Skin is warm and dry.     Findings: No rash.  Neurological:     Mental Status: She is alert.  Psychiatric:        Mood and Affect: Mood is not anxious or depressed.        Speech: Speech normal.        Behavior: Behavior normal. Behavior is cooperative.        Thought Content: Thought content normal.        Judgment: Judgment normal.       Results for orders placed or performed in visit on 09/26/21  CBC  Result Value Ref Range   WBC 3.4 (L) 4.0 - 10.5 K/uL   RBC 4.12 3.87 - 5.11 Mil/uL   Platelets 177.0 150.0 - 400.0 K/uL   Hemoglobin 11.8 (L) 12.0 - 15.0 g/dL   HCT 36.4 36.0 - 46.0 %   MCV 88.3 78.0 - 100.0 fl   MCHC 32.4 30.0 - 36.0 g/dL   RDW 15.8 (H) 11.5 - 08.6 %  Basic metabolic panel  Result Value Ref Range   Sodium 141 135 - 145 mEq/L   Potassium 3.8 3.5 - 5.1 mEq/L   Chloride 98 96 - 112 mEq/L   CO2 33 (H) 19 - 32 mEq/L   Glucose, Bld 58 (L) 70 - 99 mg/dL   BUN 42 (H) 6 - 23 mg/dL   Creatinine, Ser 1.50 (H) 0.40 - 1.20 mg/dL   GFR 31.57 (L) >60.00 mL/min   Calcium 9.4 8.4 - 10.5 mg/dL  Ferritin  Result Value Ref Range   Ferritin 46.6 10.0 - 291.0 ng/mL  POCT Urinalysis Dipstick (Automated)  Result Value Ref Range   Color, UA yellow    Clarity, UA cloudy    Glucose, UA Negative Negative   Bilirubin, UA negative    Ketones, UA negative    Spec Grav, UA 1.015 1.010 - 1.025   Blood, UA positive    pH, UA 6.0 5.0 - 8.0   Protein, UA Negative Negative   Urobilinogen, UA 0.2 0.2 or 1.0 E.U./dL   Nitrite, UA neg    Leukocytes, UA Trace (A) Negative   *Note: Due to a large number of results and/or encounters for the requested time period, some results have not been displayed. A complete set of  results can be found in Results Review.     COVID 19 screen:  No recent travel or known exposure to COVID19 The patient denies respiratory symptoms of COVID 19 at this time. The importance of social distancing was discussed today.   Assessment and Plan Problem List Items Addressed This Visit     Burning with urination - Primary    Acute, mild  Patient's urinalysis is clear today.  Most likely her symptoms are not due to kidney stone or infection.  I encouraged her to avoid foods and triggers of urinary irritation.  She will increase water intake.      Relevant Orders   POCT Urinalysis Dipstick (Automated) (Completed)   Dark stools    Given her history of GI bleed and small intestinal AVMs, I encouraged her to keep her appointment with GI for capsule endoscopy to assess this further.      Other fatigue    Acute worsening of chronic issue.  Given her history of GI bleed, recent dark schools and worsening fatigue we will evaluate with a CBC stat.  I will also evaluate her thyroid, electrolyte, kidney and  liver function as well as vitamin levels.      Relevant Orders   CBC with Differential/Platelet (Completed)   Vitamin B12 (Completed)   TSH (Completed)   VITAMIN D 25 Hydroxy (Vit-D Deficiency, Fractures) (Completed)   Orders Placed This Encounter  Procedures   CBC with Differential/Platelet   Vitamin B12   TSH   VITAMIN D 25 Hydroxy (Vit-D Deficiency, Fractures)   POCT Urinalysis Dipstick (Automated)       Eliezer Lofts, MD

## 2021-11-08 NOTE — Assessment & Plan Note (Signed)
Acute worsening of chronic issue.  Given her history of GI bleed, recent dark schools and worsening fatigue we will evaluate with a CBC stat.  I will also evaluate her thyroid, electrolyte, kidney and liver function as well as vitamin levels.

## 2021-11-08 NOTE — Assessment & Plan Note (Signed)
Given her history of GI bleed and small intestinal AVMs, I encouraged her to keep her appointment with GI for capsule endoscopy to assess this further.

## 2021-11-14 ENCOUNTER — Ambulatory Visit (INDEPENDENT_AMBULATORY_CARE_PROVIDER_SITE_OTHER): Payer: PPO | Admitting: Gastroenterology

## 2021-11-14 DIAGNOSIS — D490 Neoplasm of unspecified behavior of digestive system: Secondary | ICD-10-CM | POA: Diagnosis not present

## 2021-11-14 DIAGNOSIS — K5520 Angiodysplasia of colon without hemorrhage: Secondary | ICD-10-CM

## 2021-11-14 DIAGNOSIS — D508 Other iron deficiency anemias: Secondary | ICD-10-CM | POA: Diagnosis not present

## 2021-11-14 NOTE — Progress Notes (Signed)
SN: [id number] Exp: 2023-01-26 LOT: [id number]  Patient arrived for VCE. Reported the prep went well. This RN explained capsule dietary restrictions for the next few hours. Pt advised to return at 4 pm to return capsule equipment.  Patient verbalized understanding. Opened capsule, ensured capsule was flashing prior to the patient swallowing the capsule. Patient swallowed capsule without difficulty.  Patient told to call the office with any questions and if capsule has not passed after 72 hours. No further questions by the conclusion of the visit.

## 2021-11-14 NOTE — Patient Instructions (Signed)
You may have clear liquids beginning at 10:30 am after ingesting the capsule.    You can have a light lunch at 12:30 pm; sandwich and half bowl of soup.  Return to the office at 4 pm to return the equipment.   Return to you normal diet at 5 pm.   Call [phone] and ask for [name] BSN RN if you have any questions.  You should pass the capsule in your stool 8-48 hours after ingestion. If you have not passed the capsule, after 72 hours, please contact the office at [phone].    

## 2021-11-16 DIAGNOSIS — J449 Chronic obstructive pulmonary disease, unspecified: Secondary | ICD-10-CM | POA: Diagnosis not present

## 2021-11-16 DIAGNOSIS — I5033 Acute on chronic diastolic (congestive) heart failure: Secondary | ICD-10-CM | POA: Diagnosis not present

## 2021-11-16 DIAGNOSIS — R2689 Other abnormalities of gait and mobility: Secondary | ICD-10-CM | POA: Diagnosis not present

## 2021-11-16 DIAGNOSIS — Z4789 Encounter for other orthopedic aftercare: Secondary | ICD-10-CM | POA: Diagnosis not present

## 2021-11-16 DIAGNOSIS — M6281 Muscle weakness (generalized): Secondary | ICD-10-CM | POA: Diagnosis not present

## 2021-11-16 DIAGNOSIS — J90 Pleural effusion, not elsewhere classified: Secondary | ICD-10-CM | POA: Diagnosis not present

## 2021-11-26 ENCOUNTER — Telehealth: Payer: Self-pay | Admitting: Internal Medicine

## 2021-11-26 NOTE — Telephone Encounter (Signed)
Per 7/24 phone line pt called to cancel appointment  pt said she would call back to r/s  Canceled per pt request

## 2021-11-27 ENCOUNTER — Inpatient Hospital Stay: Payer: HMO | Admitting: Internal Medicine

## 2021-11-27 ENCOUNTER — Inpatient Hospital Stay: Payer: HMO

## 2021-12-03 ENCOUNTER — Telehealth: Payer: Self-pay | Admitting: Gastroenterology

## 2021-12-03 NOTE — Telephone Encounter (Signed)
Inbound call from patient inquiring about taking a camera pill results. Please give patient a call back to advise.  Thank you

## 2021-12-04 ENCOUNTER — Other Ambulatory Visit (HOSPITAL_BASED_OUTPATIENT_CLINIC_OR_DEPARTMENT_OTHER): Payer: Self-pay

## 2021-12-04 NOTE — Telephone Encounter (Signed)
Please see note below and advise  

## 2021-12-05 ENCOUNTER — Ambulatory Visit: Payer: PPO | Admitting: Dermatology

## 2021-12-05 NOTE — Progress Notes (Signed)
Video capsule endoscopy results reviewed and notable for the following:  Findings: 1) complete capsule endoscopy with adequate prep 2) 1 tiny, nonbleeding AVM at 2 hours 10 minutes (8 minutes beyond first duodenal image) 3) 1 small, nonbleeding AVM at 2 hours 41 minutes (within 30 minutes of first duodenal image) 4) 3-4 nonbleeding ulcers starting at 5 years 39 minutes, and last 1 seen at 4 hours 12 minutes.  The first was located 1 hour 30 minutes beyond first duodenal image, with the final ulcer located approximately 30 minutes proximal to ileocecal valve.  Based on timing, each of these ulcers located past midway point of small bowel  Recommendations: - Follow-up in the GI clinic.  Can discuss role of repeat push enteroscopy vs single balloon enteroscopy with APC and WL.  This would be mostly for treatment of the nonbleeding AVMs, as the ulcers are likely to be out of reach of enteroscopy.  Alternatively, could consider referral to [facility] for potential double-balloon enteroscopy and/or retrograde device assisted enteroscopy, which would allow for evaluation of ulcers that are in the mid to distal small bowel - Alternatively, we can discuss the role/utility of CT enterography for evaluation of the ulcers in further detail.  As none of these are actively bleeding, do not feel that urgent/emergent intervention needed at this juncture  Please schedule follow-up appointment with me

## 2021-12-05 NOTE — Telephone Encounter (Signed)
Video capsule endoscopy results reviewed and notable for the following:  Findings: 1) complete capsule endoscopy with adequate prep 2) 1 tiny, nonbleeding AVM at 2 hours 10 minutes (8 minutes beyond first duodenal image) 3) 1 small, nonbleeding AVM at 2 hours 41 minutes (within 30 minutes of first duodenal image) 4) 3-4 nonbleeding ulcers starting at 5 years 39 minutes, and last 1 seen at 4 hours 12 minutes.  The first was located 1 hour 30 minutes beyond first duodenal image, with the final ulcer located approximately 30 minutes proximal to ileocecal valve.  Based on timing, each of these ulcers located past midway point of small bowel  Recommendations: - Follow-up in the GI clinic.  Can discuss role of repeat push enteroscopy vs single balloon enteroscopy with APC and WL.  This would be mostly for treatment of the nonbleeding AVMs, as the ulcers are likely to be out of reach of enteroscopy.  Alternatively, could consider referral to [facility] for potential double-balloon enteroscopy and/or retrograde device assisted enteroscopy, which would allow for evaluation of ulcers that are in the mid to distal small bowel - Alternatively, we can discuss the role/utility of CT enterography for evaluation of the ulcers in further detail.  As none of these are actively bleeding, do not feel that urgent/emergent intervention needed at this juncture  Please schedule follow-up appointment with me

## 2021-12-05 NOTE — Telephone Encounter (Signed)
Inbound call from patient calling back inquiring on results. Please give patient a call to advise.  Thank you

## 2021-12-06 NOTE — Telephone Encounter (Signed)
Gave pt results and recommendations. Pt verbalized understanding. F/u scheduled 8/23 at 8:40 am.

## 2021-12-10 ENCOUNTER — Ambulatory Visit: Payer: PPO | Admitting: Family Medicine

## 2021-12-11 ENCOUNTER — Telehealth: Payer: Self-pay | Admitting: Family Medicine

## 2021-12-11 NOTE — Telephone Encounter (Signed)
Patient grandson came in and dropped off handicap sticker information for Dr Einar Pheasant to complete and mail out for her. I placed in Dr Verda Cumins folder up front.

## 2021-12-11 NOTE — Telephone Encounter (Signed)
Placed on Dr. Verda Cumins desk. Needs signature and date.

## 2021-12-12 NOTE — Telephone Encounter (Signed)
Placed in mail to go out.

## 2021-12-12 NOTE — Telephone Encounter (Signed)
Completed and placed in box 

## 2021-12-17 DIAGNOSIS — J90 Pleural effusion, not elsewhere classified: Secondary | ICD-10-CM | POA: Diagnosis not present

## 2021-12-17 DIAGNOSIS — J449 Chronic obstructive pulmonary disease, unspecified: Secondary | ICD-10-CM | POA: Diagnosis not present

## 2021-12-17 DIAGNOSIS — I5033 Acute on chronic diastolic (congestive) heart failure: Secondary | ICD-10-CM | POA: Diagnosis not present

## 2021-12-17 DIAGNOSIS — R2689 Other abnormalities of gait and mobility: Secondary | ICD-10-CM | POA: Diagnosis not present

## 2021-12-17 DIAGNOSIS — Z4789 Encounter for other orthopedic aftercare: Secondary | ICD-10-CM | POA: Diagnosis not present

## 2021-12-17 DIAGNOSIS — M6281 Muscle weakness (generalized): Secondary | ICD-10-CM | POA: Diagnosis not present

## 2021-12-19 ENCOUNTER — Ambulatory Visit: Payer: PPO | Admitting: Dermatology

## 2021-12-19 DIAGNOSIS — D485 Neoplasm of uncertain behavior of skin: Secondary | ICD-10-CM

## 2021-12-19 DIAGNOSIS — L905 Scar conditions and fibrosis of skin: Secondary | ICD-10-CM | POA: Diagnosis not present

## 2021-12-19 DIAGNOSIS — T148XXA Other injury of unspecified body region, initial encounter: Secondary | ICD-10-CM

## 2021-12-19 DIAGNOSIS — C44722 Squamous cell carcinoma of skin of right lower limb, including hip: Secondary | ICD-10-CM

## 2021-12-19 DIAGNOSIS — L578 Other skin changes due to chronic exposure to nonionizing radiation: Secondary | ICD-10-CM | POA: Diagnosis not present

## 2021-12-19 DIAGNOSIS — L570 Actinic keratosis: Secondary | ICD-10-CM

## 2021-12-19 DIAGNOSIS — D0472 Carcinoma in situ of skin of left lower limb, including hip: Secondary | ICD-10-CM | POA: Diagnosis not present

## 2021-12-19 MED ORDER — CLOBETASOL PROPIONATE 0.05 % EX CREA: TOPICAL_CREAM | CUTANEOUS | 0 refills | Status: DC

## 2021-12-19 NOTE — Patient Instructions (Signed)
Wound Care Instructions  Cleanse wound gently with soap and water once a day then pat dry with clean gauze. Apply a thin coat of Petrolatum (petroleum jelly, "Vaseline") over the wound (unless you have an allergy to this). We recommend that you use a new, sterile tube of Vaseline. Do not pick or remove scabs. Do not remove the yellow or white "healing tissue" from the base of the wound.  Cover the wound with fresh, clean, nonstick gauze and secure with paper tape. You may use Band-Aids in place of gauze and tape if the wound is small enough, but would recommend trimming much of the tape off as there is often too much. Sometimes Band-Aids can irritate the skin.  You should call the office for your biopsy report after 1 week if you have not already been contacted.  If you experience any problems, such as abnormal amounts of bleeding, swelling, significant bruising, significant pain, or evidence of infection, please call the office immediately.  FOR ADULT SURGERY PATIENTS: If you need something for pain relief you may take 1 extra strength Tylenol (acetaminophen) AND 2 Ibuprofen (200mg each) together every 4 hours as needed for pain. (do not take these if you are allergic to them or if you have a reason you should not take them.) Typically, you may only need pain medication for 1 to 3 days.     Due to recent changes in healthcare laws, you may see results of your pathology and/or laboratory studies on MyChart before the doctors have had a chance to review them. We understand that in some cases there may be results that are confusing or concerning to you. Please understand that not all results are received at the same time and often the doctors may need to interpret multiple results in order to provide you with the best plan of care or course of treatment. Therefore, we ask that you please give us 2 business days to thoroughly review all your results before contacting the office for clarification. Should  we see a critical lab result, you will be contacted sooner.   If You Need Anything After Your Visit  If you have any questions or concerns for your doctor, please call our main line at [phone] and press option 4 to reach your doctor's medical assistant. If no one answers, please leave a voicemail as directed and we will return your call as soon as possible. Messages left after 4 pm will be answered the following business day.   You may also send us a message via MyChart. We typically respond to MyChart messages within 1-2 business days.  For prescription refills, please ask your pharmacy to contact our office. Our fax number is [phone].  If you have an urgent issue when the clinic is closed that cannot wait until the next business day, you can page your doctor at the number below.    Please note that while we do our best to be available for urgent issues outside of office hours, we are not available 24/7.   If you have an urgent issue and are unable to reach us, you may choose to seek medical care at your doctor's office, retail clinic, urgent care center, or emergency room.  If you have a medical emergency, please immediately call 911 or go to the emergency department.  Pager Numbers  - Dr. Kowalski: [phone]  - Dr. Moye: [phone]  - Dr. Stewart: [phone]  In the event of inclement weather, please call our main line at   [phone] for an update on the status of any delays or closures.  Dermatology Medication Tips: Please keep the boxes that topical medications come in in order to help keep track of the instructions about where and how to use these. Pharmacies typically print the medication instructions only on the boxes and not directly on the medication tubes.   If your medication is too expensive, please contact our office at [phone] option 4 or send us a message through MyChart.   We are unable to tell what your co-pay for medications will be in  advance as this is different depending on your insurance coverage. However, we may be able to find a substitute medication at lower cost or fill out paperwork to get insurance to cover a needed medication.   If a prior authorization is required to get your medication covered by your insurance company, please allow us 1-2 business days to complete this process.  Drug prices often vary depending on where the prescription is filled and some pharmacies may offer cheaper prices.  The website www.goodrx.com contains coupons for medications through different pharmacies. The prices here do not account for what the cost may be with help from insurance (it may be cheaper with your insurance), but the website can give you the price if you did not use any insurance.  - You can print the associated coupon and take it with your prescription to the pharmacy.  - You may also stop by our office during regular business hours and pick up a GoodRx coupon card.  - If you need your prescription sent electronically to a different pharmacy, notify our office through [facility] MyChart or by phone at [phone] option 4.     Si Usted Necesita Algo Despus de Su Visita  Tambin puede enviarnos un mensaje a travs de MyChart. Por lo general respondemos a los mensajes de MyChart en el transcurso de 1 a 2 das hbiles.  Para renovar recetas, por favor pida a su farmacia que se ponga en contacto con nuestra oficina. Nuestro nmero de fax es el [phone].  Si tiene un asunto urgente cuando la clnica est cerrada y que no puede esperar hasta el siguiente da hbil, puede llamar/localizar a su doctor(a) al nmero que aparece a continuacin.   Por favor, tenga en cuenta que aunque hacemos todo lo posible para estar disponibles para asuntos urgentes fuera del horario de oficina, no estamos disponibles las 24 horas del da, los 7 das de la semana.   Si tiene un problema urgente y no puede comunicarse con nosotros, puede  optar por buscar atencin mdica  en el consultorio de su doctor(a), en una clnica privada, en un centro de atencin urgente o en una sala de emergencias.  Si tiene una emergencia mdica, por favor llame inmediatamente al 911 o vaya a la sala de emergencias.  Nmeros de bper  - Dr. Kowalski: [phone]  - Dra. Moye: [phone]  - Dra. Stewart: [phone]  En caso de inclemencias del tiempo, por favor llame a nuestra lnea principal al [phone] para una actualizacin sobre el estado de cualquier retraso o cierre.  Consejos para la medicacin en dermatologa: Por favor, guarde las cajas en las que vienen los medicamentos de uso tpico para ayudarle a seguir las instrucciones sobre dnde y cmo usarlos. Las farmacias generalmente imprimen las instrucciones del medicamento slo en las cajas y no directamente en los tubos del medicamento.   Si su medicamento es muy caro, por favor, pngase en contacto con   nuestra oficina llamando al [phone] y presione la opcin 4 o envenos un mensaje a travs de MyChart.   No podemos decirle cul ser su copago por los medicamentos por adelantado ya que esto es diferente dependiendo de la cobertura de su seguro. Sin embargo, es posible que podamos encontrar un medicamento sustituto a menor costo o llenar un formulario para que el seguro cubra el medicamento que se considera necesario.   Si se requiere una autorizacin previa para que su compaa de seguros cubra su medicamento, por favor permtanos de 1 a 2 das hbiles para completar este proceso.  Los precios de los medicamentos varan con frecuencia dependiendo del lugar de dnde se surte la receta y alguna farmacias pueden ofrecer precios ms baratos.  El sitio web www.goodrx.com tiene cupones para medicamentos de diferentes farmacias. Los precios aqu no tienen en cuenta lo que podra costar con la ayuda del seguro (puede ser ms barato con su seguro), pero el sitio web puede darle el  precio si no utiliz ningn seguro.  - Puede imprimir el cupn correspondiente y llevarlo con su receta a la farmacia.  - Tambin puede pasar por nuestra oficina durante el horario de atencin regular y recoger una tarjeta de cupones de GoodRx.  - Si necesita que su receta se enve electrnicamente a una farmacia diferente, informe a nuestra oficina a travs de MyChart de [place] o por telfono llamando al [phone] y presione la opcin 4.  

## 2021-12-19 NOTE — Progress Notes (Signed)
Follow-Up Visit   Subjective  [name] is a 88 y.o. female who presents for the following: Several spots to check.  Patient presents for several growths on the right leg, left foot, and left forearm. 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 Spot on thigh is very painful.  The following portions of the chart were reviewed this encounter and updated as appropriate:       Review of Systems:  No other skin or systemic complaints except as noted in HPI or Assessment and Plan.  Objective  Well appearing patient in no apparent distress; mood and affect are within normal limits.  A focused examination was performed including face, arms, legs. Relevant physical exam findings are noted in the Assessment and Plan.  Right Posterior Medial Thigh 1.0 cm erythematous nodule         Left Lateral Heel 1.0 cm keratotic nodule        Left lateral foot Pink plaque with excoriations c/w hypertrophic scar with irritation  legs and feet Multiple keratotic papules.  Left Forearm Crusted papule    Assessment & Plan  Neoplasm of uncertain behavior of skin (2) Right Posterior Medial Thigh  Skin / nail biopsy Type of biopsy: tangential   Informed consent: discussed and consent obtained   Patient was prepped and draped in usual sterile fashion: Area prepped with alcohol. Anesthesia: the lesion was anesthetized in a standard fashion   Anesthetic:  1% lidocaine w/ epinephrine 1-100,000 buffered w/ 8.4% NaHCO3 Instrument used: flexible razor blade   Hemostasis achieved with: pressure, aluminum chloride and electrodesiccation   Outcome: patient tolerated procedure well    Destruction of lesion  Destruction method: electrodesiccation and curettage   Informed consent: discussed and consent obtained   Curettage performed in three different directions: Yes    Electrodesiccation performed over the curetted area: Yes   Final wound size (cm):  1 Hemostasis achieved with:  pressure, aluminum chloride and electrodesiccation Outcome: patient tolerated procedure well with no complications   Post-procedure details: wound care instructions given   Post-procedure details comment:  Ointment and bandage applied.  Specimen 1 - Surgical pathology Differential Diagnosis: r/o SCC Check Margins: No 1.0 cm erythematous nodule  EDC   Left Lateral Heel  Epidermal / dermal shaving  Lesion diameter (cm):  1.1 Informed consent: discussed and consent obtained   Patient was prepped and draped in usual sterile fashion: Area prepped with alcohol. Anesthesia: the lesion was anesthetized in a standard fashion   Anesthetic:  1% lidocaine w/ epinephrine 1-100,000 buffered w/ 8.4% NaHCO3 Instrument used: flexible razor blade   Hemostasis achieved with: pressure, aluminum chloride and electrodesiccation   Outcome: patient tolerated procedure well   Post-procedure details: wound care instructions given   Post-procedure details comment:  Ointment and small bandage applied  Specimen 2 - Surgical pathology Differential Diagnosis: Wart r/o SCC Check Margins: No 1.0 cm keratotic nodule   Scar Left lateral foot  Hx of SCC treated with Mohs 02/22/2021.  Start clobetasol cream Apply to AA BID until improved dsp 30g 0Rf. Avoid face, groin, axilla.   Recheck on f/u.  clobetasol cream (TEMOVATE) 0.05 % - Left lateral foot Apply to affected area left foot twice a day until improved. Avoid face, groin, axilla.  Hypertrophic actinic keratosis legs and feet  Treat with cryotherapy on follow-up.  Actinic keratoses are precancerous  spots that appear secondary to cumulative UV radiation exposure/sun exposure over time. They are chronic with expected duration over 1 year. A portion of actinic keratoses will progress to squamous cell carcinoma of the skin. It is not  possible to reliably predict which spots will progress to skin cancer and so treatment is recommended to prevent development of skin cancer.  Recommend daily broad spectrum sunscreen SPF 30+ to sun-exposed areas, reapply every 2 hours as needed.  Recommend staying in the shade or wearing long sleeves, sun glasses (UVA+UVB protection) and wide brim hats (4-inch brim around the entire circumference of the hat). Call for new or changing lesions.    Excoriation Left Forearm  Crust removed today, followed by mupirocin ointment and bandage. Continue wound care until healed.   Recheck on f/u.   Actinic Damage - chronic, secondary to cumulative UV radiation exposure/sun exposure over time - diffuse scaly erythematous macules with underlying dyspigmentation - Recommend daily broad spectrum sunscreen SPF 30+ to sun-exposed areas, reapply every 2 hours as needed.  - Recommend staying in the shade or wearing long sleeves, sun glasses (UVA+UVB protection) and wide brim hats (4-inch brim around the entire circumference of the hat). - Call for new or changing lesions.   Return in about 1 month (around 01/19/2022) for f/u bx, tx HyAKs.  IJamesetta Orleans, CMA, am acting as scribe for Brendolyn Patty, MD .  Documentation: I have reviewed the above documentation for accuracy and completeness, and I agree with the above.  Brendolyn Patty MD

## 2021-12-20 ENCOUNTER — Ambulatory Visit (INDEPENDENT_AMBULATORY_CARE_PROVIDER_SITE_OTHER): Payer: PPO | Admitting: Family Medicine

## 2021-12-20 VITALS — BP 100/50 | HR 59 | Temp 97.4°F | Wt 150.4 lb

## 2021-12-20 DIAGNOSIS — I5032 Chronic diastolic (congestive) heart failure: Secondary | ICD-10-CM | POA: Diagnosis not present

## 2021-12-20 DIAGNOSIS — D649 Anemia, unspecified: Secondary | ICD-10-CM

## 2021-12-20 DIAGNOSIS — I10 Essential (primary) hypertension: Secondary | ICD-10-CM

## 2021-12-20 LAB — COMPREHENSIVE METABOLIC PANEL
ALT: 8 U/L (ref 0–35)
AST: 16 U/L (ref 0–37)
Albumin: 4.4 g/dL (ref 3.5–5.2)
Alkaline Phosphatase: 52 U/L (ref 39–117)
BUN: 35 mg/dL — ABNORMAL HIGH (ref 6–23)
CO2: 32 mEq/L (ref 19–32)
Calcium: 9.2 mg/dL (ref 8.4–10.5)
Chloride: 98 mEq/L (ref 96–112)
Creatinine, Ser: 1.63 mg/dL — ABNORMAL HIGH (ref 0.40–1.20)
GFR: 28.53 mL/min — ABNORMAL LOW (ref >60.00)
Glucose, Bld: 77 mg/dL (ref 70–99)
Potassium: 4.3 mEq/L (ref 3.5–5.1)
Sodium: 139 mEq/L (ref 135–145)
Total Bilirubin: 0.6 mg/dL (ref 0.2–1.2)
Total Protein: 6.9 g/dL (ref 6.0–8.3)

## 2021-12-20 LAB — CBC
HCT: 34.8 % — ABNORMAL LOW (ref 36.0–46.0)
Hemoglobin: 11.1 g/dL — ABNORMAL LOW (ref 12.0–15.0)
MCHC: 32 g/dL (ref 30.0–36.0)
MCV: 86.9 fl (ref 78.0–100.0)
Platelets: 166 10*3/uL (ref 150.0–400.0)
RBC: 4.01 Mil/uL (ref 3.87–5.11)
RDW: 16.3 % — ABNORMAL HIGH (ref 11.5–15.5)
WBC: 3.4 10*3/uL — ABNORMAL LOW (ref 4.0–10.5)

## 2021-12-20 LAB — FERRITIN: Ferritin: 30.9 ng/mL (ref 10.0–291.0)

## 2021-12-20 NOTE — Progress Notes (Signed)
Subjective:     [name] is a 88 y.o. female presenting for Fatigue and Dizziness (Ongoing )     Dizziness    #Dizziness - occasional HA - seeing the GI doctor next Wednesday - had a GI study done - with non-bleeding ulcers but may need intervention - head does not feel right - symptoms in eyes which ache - no double or blurry vision, seeing ok, eyes watering - has not seen the eye doctor in years - occasionally itchy eyes - hx of cataracts  Continues to have dark stools Is also having incontinence   Low bp - taking torsemide  - whenever she takes metolazone she gets severe symptoms - takes this about 1 time per week - also taking losartan 12.5 mg  Review of Systems  Neurological:  Positive for dizziness.    09/19/2021: Cardiology - CHF clinic - worsening fatigue/DOE - ECHO with severe valvular disease  Social History   Tobacco Use  Smoking Status Former   Packs/day: 0.50   Years: 40.00   Total pack years: 20.00   Types: Cigarettes   Quit date: 05/06/2001   Years since quitting: 20.6  Smokeless Tobacco Never        Objective:    BP Readings from Last 3 Encounters:  12/20/21 (!) 100/50  11/08/21 124/74  10/22/21 140/68   Wt Readings from Last 3 Encounters:  12/20/21 150 lb 6 oz (68.2 kg)  11/08/21 152 lb 3 oz (69 kg)  10/22/21 148 lb (67.1 kg)    BP (!) 100/50   Pulse (!) 59   Temp (!) 97.4 F (36.3 C) (Temporal)   Wt 150 lb 6 oz (68.2 kg)   SpO2 99%   BMI 23.91 kg/m    Physical Exam Constitutional:      General: She is not in acute distress.    Appearance: She is well-developed. She is not diaphoretic.  HENT:     Right Ear: External ear normal.     Left Ear: External ear normal.     Nose: Nose normal.  Eyes:     Conjunctiva/sclera: Conjunctivae normal.  Cardiovascular:     Rate and Rhythm: Normal rate and regular rhythm.     Heart sounds: Murmur heard.  Pulmonary:     Effort: Pulmonary effort is normal. No respiratory  distress.     Breath sounds: Normal breath sounds. No wheezing.  Musculoskeletal:     Cervical back: Neck supple.     Right lower leg: Edema (trace) present.     Left lower leg: Edema (trace) present.  Skin:    General: Skin is warm and dry.     Capillary Refill: Capillary refill takes less than 2 seconds.  Neurological:     Mental Status: She is alert. Mental status is at baseline.  Psychiatric:        Mood and Affect: Mood normal.        Behavior: Behavior normal.           Assessment & Plan:   Problem List Items Addressed This Visit       Cardiovascular and Mediastinum   Hypertension - Primary    Blood pressure low today, discussed with her heart failure doctor and we will stop losartan 12.5 mg.  Labs checked today to evaluate for any changes in her kidney function given worsening dizziness.      Relevant Orders   Comprehensive metabolic panel   Chronic diastolic CHF (congestive heart failure) ([place])  She appears euvolemic today without significant edema.  Continue torsemide 100 mg daily and once weekly metolazone 2.5 mg.        Other   Symptomatic anemia    She continues to have dark stools, recently had a GI study done with nonbleeding ulcers.  She has follow-up with GI next week.  Worsening dizziness we will reassess her blood counts and iron level to see if there is been any change or any additional intervention needs to be made.      Relevant Orders   CBC   Ferritin     Return if symptoms worsen or fail to improve.  Lesleigh Noe, MD

## 2021-12-20 NOTE — Assessment & Plan Note (Signed)
She appears euvolemic today without significant edema.  Continue torsemide 100 mg daily and once weekly metolazone 2.5 mg.

## 2021-12-20 NOTE — Patient Instructions (Addendum)
Schedule an eye exam  Ok to hold losartan tonight  I will update when hear back from the cardiologist  Your blood pressure high.   High blood pressure increases your risk for heart attack and stroke.    Please check your blood pressure 2-4 times a week.   To check your blood pressure 1) Sit in a quiet and relaxed place for 5 minutes 2) Make sure your feet are flat on the ground 3) Consider checking first thing in the morning   Normal blood pressure is less than 140/90 Ideally you blood pressure should be around 120/80  Update if consistently <100/50-60  We can consider pelvic floor physical therapy for stool and urine control

## 2021-12-20 NOTE — Assessment & Plan Note (Signed)
Blood pressure low today, discussed with her heart failure doctor and we will stop losartan 12.5 mg.  Labs checked today to evaluate for any changes in her kidney function given worsening dizziness.

## 2021-12-20 NOTE — Assessment & Plan Note (Signed)
She continues to have dark stools, recently had a GI study done with nonbleeding ulcers.  She has follow-up with GI next week.  Worsening dizziness we will reassess her blood counts and iron level to see if there is been any change or any additional intervention needs to be made.

## 2021-12-24 ENCOUNTER — Telehealth: Payer: Self-pay

## 2021-12-24 NOTE — Telephone Encounter (Signed)
-----   Message from Brendolyn Patty, MD sent at 12/24/2021  1:36 PM EDT ----- 1. Skin , right posterior medial thigh MODERATELY DIFFERENTIATED SQUAMOUS CELL CARCINOMA, CRUSTED 2. Skin , left lateral heel SQUAMOUS CELL CARCINOMA IN SITU, ASSOCIATED WITH VERRUCA, BASE INVOLVED, CRUSTED - please call patient  1. SCC skin cancer- already treated with EDC at time of biopsy 2. SCCIS skin cancer with associated wart, needs EDC

## 2021-12-24 NOTE — Telephone Encounter (Signed)
Advised pt of bx results.  Pt has f/u on 01/22/22 advised we would treat the SCC IS at that appt./sh

## 2021-12-26 ENCOUNTER — Telehealth: Payer: Self-pay | Admitting: Pharmacy Technician

## 2021-12-26 ENCOUNTER — Other Ambulatory Visit: Payer: Self-pay

## 2021-12-26 ENCOUNTER — Ambulatory Visit: Payer: PPO | Admitting: Gastroenterology

## 2021-12-26 ENCOUNTER — Encounter: Payer: Self-pay | Admitting: Gastroenterology

## 2021-12-26 VITALS — BP 130/60 | HR 60 | Resp 16 | Ht 66.5 in | Wt 149.6 lb

## 2021-12-26 DIAGNOSIS — D638 Anemia in other chronic diseases classified elsewhere: Secondary | ICD-10-CM

## 2021-12-26 DIAGNOSIS — D508 Other iron deficiency anemias: Secondary | ICD-10-CM

## 2021-12-26 DIAGNOSIS — Z7901 Long term (current) use of anticoagulants: Secondary | ICD-10-CM

## 2021-12-26 DIAGNOSIS — D490 Neoplasm of unspecified behavior of digestive system: Secondary | ICD-10-CM

## 2021-12-26 DIAGNOSIS — I5032 Chronic diastolic (congestive) heart failure: Secondary | ICD-10-CM

## 2021-12-26 DIAGNOSIS — I4811 Longstanding persistent atrial fibrillation: Secondary | ICD-10-CM

## 2021-12-26 DIAGNOSIS — K5520 Angiodysplasia of colon without hemorrhage: Secondary | ICD-10-CM

## 2021-12-26 MED ORDER — OCTREOTIDE ACETATE 10 MG IM KIT: 10.0000 mg | PACK | INTRAMUSCULAR | 5 refills | Status: DC

## 2021-12-26 NOTE — Progress Notes (Signed)
Chief Complaint:    Iron deficiency anemia, discussed results from VCE  GI History: 88 year old female with a history of A-fib (on Eliquis),  ischemic colitis (2020),  cardiomyopathy s/p ICD, chronic diastolic CHF, COPD, hypothyroidism, TIA, CKD 3, renal cell cancer s/p right nephrectomy, peripheral artery disease , cholecystectomy, diverticulosis.  - 02/2019: Inpatient admission for acute on chronic anemia on Coumadin.  EGD with mild gastritis.  Colonoscopy with ischemic colitis.  VCE with small bowel AVMs in duodenum and small streak of blood in small bowel.  Treated with PRBC transfusion and IV iron. - 03/2020: Hospital admission for acute on chronic anemia and melena with supratherapeutic INR.  Push enteroscopy with 1 duodenal AVM, 2 jejunal AVMs (1 actively bleeding) treated with APC - 02/2021: Hospital admission for acute on chronic anemia on Eliquis along with left foot cellulitis with sepsis and COPD exacerbation. Push enteroscopy with a few gastric AVMs treated with APC, for duodenal bulb/D2 AVMs (actively bleeding) treated with APC, single proximal jejunal AVM treated with APC. - 11/14/2021: VCE: 1 tiny nonbleeding AVM in proximal duodenum, 1 small nonbleeding AVM in proximal small bowel, 3-4 nonbleeding ulcers in mid to distal small bowel, likely beyond reach of single balloon enteroscopy  HPI:     Patient is a 88 y.o. female presenting to the [facility]ology [facility] for follow-up.  She was last seen by me in the office on 10/22/2021, with subsequent VCE completed last month as outlined above.  Small bowel AVMs within reach of push enteroscopy and/or single balloon enteroscopy, but ulcers likely out of reach.    Does report having dark stools, but continues to take oral iron.  -12/20/2021: H/H 11.1/34.8, WBC 3.4, MCV/RDW 87/16 (all stable, chronic).  Ferritin 31.  BUN/creatinine 35/1.6, with otherwise normal CMP  Presents to the office today with her daughter.  Review of systems:      No chest pain, no SOB, no fevers, no urinary sx   Past Medical History:  Diagnosis Date   Actinic keratosis 01/17/2015   R forearm   Adjustment disorder with anxiety    Anemia    Anginal pain ([place])    Arthritis    "some in my hands" (11/11/2012)   Atrial fibrillation (HCC)    Atypical mole 03/25/2018   L forearm - severe   Automatic implantable cardioverter-defibrillator in situ    Avascular necrosis of hip ([place]) 05/03/2011   Blood in stool    Carotid artery stenosis 09/2007   a. 09/2007: 60-79% bilateral (stable); b. 10/2008: 40-59% R 60-79%    Chest pain, unspecified    Chronic airway obstruction, not elsewhere classified    Chronic bronchitis ([place])    "get it some; not q year" (11/11/2012)   Chronic diastolic CHF (congestive heart failure) ([place]) 06/04/2013   Chronic kidney disease, unspecified    Coronary artery disease    non-obstructive by 2006 cath   Displaced fracture of left femoral neck ([place]) 10/09/2018   Frequent UTI    "get them a couple times/yr" (11/11/2012)   GI bleed 03/28/2020   Hemorrhage of rectum and anus    High cholesterol    History of blood transfusion 04/2011   "after hip OR" (11/11/2012)   Hx of renal cell cancer    Hypertension 05/20/2011   Hypertr obst cardiomyop    Hypotension, unspecified    cardiac cath 2006..nonobstructive CAD 30-40s lesions.Marland KitchenETT 1/09 nondiagnostic due to poor HR response..Right Renal Cancer 2003   Long term (current) use of anticoagulants  Malignant neoplasm of kidney, except pelvis    Osteoarthritis of right hip    Other and unspecified coagulation defects    PONV (postoperative nausea and vomiting)    Presence of permanent cardiac pacemaker    RECTAL BLEEDING 10/13/2009   Qualifier: Diagnosis of  By: Chester Holstein NP, Paula     Renal cancer [facility]) 06/2001   Right   Secondary cardiomyopathy, unspecified    Sinus bradycardia    Squamous cell carcinoma of skin 01/17/2015   R lat wrist   Squamous cell carcinoma of skin 01/29/2018    R post upper leg - superficially invasive   Squamous cell carcinoma of skin 03/25/2018   L lat foot   Squamous cell carcinoma of skin 11/09/2020   left lat foot - EDC 01/01/21, recurrent 01/31/21 - MOHs 02/22/21   Squamous cell carcinoma of skin 12/19/2021   Right Posterior Medial Thigh, EDC   Squamous cell carcinoma of skin 12/19/2021   SCC IS, L lat heel, needs EDC   Urge incontinence     Patient's surgical history, family medical history, social history, medications and allergies were all reviewed in Epic    Current Outpatient Medications  Medication Sig Dispense Refill   acetaminophen (TYLENOL) 500 MG tablet Take 500-1,000 mg by mouth every 8 (eight) hours as needed for mild pain or headache.     Cholecalciferol (VITAMIN D) 50 MCG (2000 UT) CAPS Take 4,000 Units by mouth daily.     clobetasol cream (TEMOVATE) 0.05 % Apply to affected area left foot twice a day until improved. Avoid face, groin, axilla. 30 g 0   Cranberry (AZO CRANBERRY GUMMIES) 500 MG CHEW Chew 2,000 mg by mouth daily.     Cyanocobalamin (B-12 PO) Take 1 tablet by mouth daily.      ELIQUIS 2.5 MG TABS tablet TAKE 1 TABLET BY MOUTH TWICE A DAY 60 tablet 11   ezetimibe (ZETIA) 10 MG tablet Take 1 tablet (10 mg total) by mouth daily. 30 tablet 11   ferrous sulfate 325 (65 FE) MG tablet Take 1 tablet (325 mg total) by mouth 2 (two) times daily with a meal. 60 tablet 0   fluticasone (FLONASE) 50 MCG/ACT nasal spray Place 2 sprays into both nostrils daily as needed for allergies or rhinitis. 16 g 6   levothyroxine (SYNTHROID) 25 MCG tablet TAKE 1 TABLET BY MOUTH ONCE DAILY BEFOREBREAKFAST 30 tablet 3   magnesium oxide (MAG-OX) 400 MG tablet Take 1 tablet (400 mg total) by mouth daily. 30 tablet 2   metolazone (ZAROXOLYN) 2.5 MG tablet Take 1 tablet (2.5 mg total) by mouth as directed. Take every other Thursday with your morning dose of Torsemide (Patient taking differently: Take 2.5 mg by mouth once a week. Take Thursday  with your morning dose of Torsemide) 60 tablet 2   ondansetron (ZOFRAN) 4 MG tablet Take 1 tablet (4 mg total) by mouth every 8 (eight) hours as needed for nausea or vomiting. 20 tablet 1   pantoprazole (PROTONIX) 40 MG tablet Take 1 tablet (40 mg total) by mouth 2 (two) times daily before a meal. 60 tablet 0   potassium chloride SA (KLOR-CON M) 20 MEQ tablet TAKE 2 TABLETS (40 MEQ) BY MOUTH 2 TIMESDAILY. TAKE 1 EXTRA TABLET ON THE DAYS YOU TAKE METOLAZONE. 360 tablet 3   rosuvastatin (CRESTOR) 40 MG tablet Take 1 tablet (40 mg total) by mouth daily. 90 tablet 3   torsemide (DEMADEX) 100 MG tablet Take 100 mg by mouth daily. 80  mg in the PM     vitamin C (ASCORBIC ACID) 250 MG tablet Take 500 mg by mouth daily.     No current facility-administered medications for this visit.    Physical Exam:     There were no vitals taken for this visit.  GENERAL:  Pleasant female in NAD PSYCH: : Cooperative, normal affect NEURO: Alert and oriented x 3, no focal neurologic deficits   IMPRESSION and PLAN:    1) Small bowel AVMs 2) Iron deficiency anemia superimposed on anemia of chronic disease 3) Small bowel nonbleeding ulcers  Discussed VCE results today which were notable for a few small nonbleeding AVMs in the proximal small bowel, and a few small nonbleeding ulcers in the mid to distal small bowel.  The AVMs would be within reach of push enteroscopy and/or single balloon enteroscopy, but the ulcers require retrograde device assisted enteroscopy which is not available locally.  While we could ablate the AVMs via enteroscopy, that certainly does not preclude new AVMs from forming.  Additionally discussed the role/utility of octreotide in decreasing frequency/severity of AVM bleeding.  Discussed the potential medication ADRs, to include bradycardia, GI symptoms, etc. and patient would like to proceed as follows:  - Schedule IV iron treatment - Once she receives IV iron, she will hold off on oral iron  for a couple of weeks and see if the dark stools resolved. - Repeat CBC and iron check 2 months after IV iron, then continue monitoring serial CBC and iron checks as a surrogate marker for occult bleeding - Will submit for approval of octreotide LAR 10 mg monthly injection.  She is not interested in the short acting bid octreotide - Reserve enteroscopy for acute bleed or acute decline in hemoglobin - Can consider CT enterography in the future if needing to work-up small, nonbleeding ileal ulcers  4) Chronic diastolic CHF 5) Atrial fibrillation (on Eliquis) 6) Cardiomyopathy with ICD 7) COPD 8) Aortic stenosis       I spent 35 minutes of time, including in depth chart review, independent review of results as outlined above, communicating results with the patient directly, face-to-face time with the patient, coordinating care, and ordering studies and medications as appropriate, and documentation.        Lavena Bullion ,DO, FACG 12/26/2021, 8:53 AM

## 2021-12-26 NOTE — Patient Instructions (Addendum)
If you are age 86 or older, your body mass index should be between 23-30. Your There is no height or weight on file to calculate BMI. If this is out of the aforementioned range listed, please consider follow up with your Primary Care Provider.   __________________________________________________________  The [place] [facility] providers would like to encourage you to use [facility] to communicate with providers for non-urgent requests or questions.  Due to long hold times on the telephone, sending your provider a message by [facility] may be a faster and more efficient way to get a response.  Please allow 48 business hours for a response.  Please remember that this is for non-urgent requests.    Due to recent changes in healthcare laws, you may see the results of your imaging and laboratory studies on MyChart before your provider has had a chance to review them.  We understand that in some cases there may be results that are confusing or concerning to you. Not all laboratory results come back in the same time frame and the provider may be waiting for multiple results in order to interpret others.  Please give [place] 48 hours in order for your provider to thoroughly review all the results before contacting the office for clarification of your results.    Your provider has requested that you go to the basement level for lab work in 2 months. Press "B" on the elevator. The lab is located at the first door on the left as you exit the elevator.   We will initiate IV Iron infusion through [facility], and also we will obtain Prior Auth for Octreotide injection.  Thank you for choosing me and [facility].  Vito Cirigliano, D.O.

## 2021-12-26 NOTE — Telephone Encounter (Signed)
Dr. Bryan Lemma, Juluis Rainier note:  Auth Submission: NO AUTH NEEDED Payer: [employer] Medication & CPT/J Code(s) submitted: Feraheme (ferumoxytol) [id number] Route of submission (phone, fax, portal): phone Phone [phone] Fax # Auth type: Buy/Bill Units/visits requested: x2 doses Reference number: [id number] Approval from: 12/26/21 to 03/28/22   Patient will be scheduled as soon as possible

## 2022-01-02 ENCOUNTER — Ambulatory Visit (INDEPENDENT_AMBULATORY_CARE_PROVIDER_SITE_OTHER): Payer: PPO

## 2022-01-02 VITALS — BP 149/72 | HR 58 | Temp 97.7°F | Resp 16 | Ht 66.5 in | Wt 145.8 lb

## 2022-01-02 DIAGNOSIS — D508 Other iron deficiency anemias: Secondary | ICD-10-CM | POA: Diagnosis not present

## 2022-01-02 MED ORDER — SODIUM CHLORIDE 0.9 % IV SOLN
510.0000 mg | Freq: Once | INTRAVENOUS | Status: AC
  Administered 2022-01-02: 510 mg via INTRAVENOUS
  Filled 2022-01-02: qty 17

## 2022-01-02 NOTE — Progress Notes (Signed)
Diagnosis: Iron Deficiency Anemia  Provider:  Marshell Garfinkel MD  Procedure: Infusion  IV Type: Peripheral, IV Location: R Antecubital  Feraheme (Ferumoxytol), Dose: 510 mg  Infusion Start Time: 1661  Infusion Stop Time: 9694  Post Infusion IV Care:15 minute observation period completed Peripheral IV Discontinued  Discharge: Condition: Good, Destination: Home . AVS provided to patient.   Performed by:  Adelina Mings, LPN

## 2022-01-03 ENCOUNTER — Other Ambulatory Visit (HOSPITAL_COMMUNITY): Payer: Self-pay | Admitting: Family Medicine

## 2022-01-09 ENCOUNTER — Ambulatory Visit (INDEPENDENT_AMBULATORY_CARE_PROVIDER_SITE_OTHER): Payer: PPO

## 2022-01-09 VITALS — BP 136/75 | HR 60 | Temp 97.7°F | Resp 16 | Ht 66.5 in | Wt 146.4 lb

## 2022-01-09 DIAGNOSIS — D508 Other iron deficiency anemias: Secondary | ICD-10-CM

## 2022-01-09 MED ORDER — SODIUM CHLORIDE 0.9 % IV SOLN
510.0000 mg | Freq: Once | INTRAVENOUS | Status: AC
  Administered 2022-01-09: 510 mg via INTRAVENOUS
  Filled 2022-01-09: qty 17

## 2022-01-09 NOTE — Progress Notes (Signed)
Diagnosis: Iron Deficiency Anemia  Provider:  Marshell Garfinkel MD  Procedure: Infusion  IV Type: Peripheral, IV Location: L Antecubital  Feraheme (Ferumoxytol), Dose: 510 mg  Infusion Start Time: 2550  Infusion Stop Time: 1401  Post Infusion IV Care: Peripheral IV Discontinued  Discharge: Condition: Good, Destination: Home . AVS provided to patient.   Performed by:  Arnoldo Morale, RN

## 2022-01-17 DIAGNOSIS — I5033 Acute on chronic diastolic (congestive) heart failure: Secondary | ICD-10-CM | POA: Diagnosis not present

## 2022-01-17 DIAGNOSIS — J90 Pleural effusion, not elsewhere classified: Secondary | ICD-10-CM | POA: Diagnosis not present

## 2022-01-17 DIAGNOSIS — Z4789 Encounter for other orthopedic aftercare: Secondary | ICD-10-CM | POA: Diagnosis not present

## 2022-01-17 DIAGNOSIS — R2689 Other abnormalities of gait and mobility: Secondary | ICD-10-CM | POA: Diagnosis not present

## 2022-01-17 DIAGNOSIS — J449 Chronic obstructive pulmonary disease, unspecified: Secondary | ICD-10-CM | POA: Diagnosis not present

## 2022-01-17 DIAGNOSIS — M6281 Muscle weakness (generalized): Secondary | ICD-10-CM | POA: Diagnosis not present

## 2022-01-22 ENCOUNTER — Ambulatory Visit: Payer: PPO | Admitting: Dermatology

## 2022-01-22 DIAGNOSIS — L578 Other skin changes due to chronic exposure to nonionizing radiation: Secondary | ICD-10-CM | POA: Diagnosis not present

## 2022-01-22 DIAGNOSIS — D099 Carcinoma in situ, unspecified: Secondary | ICD-10-CM

## 2022-01-22 DIAGNOSIS — D485 Neoplasm of uncertain behavior of skin: Secondary | ICD-10-CM

## 2022-01-22 DIAGNOSIS — Z85828 Personal history of other malignant neoplasm of skin: Secondary | ICD-10-CM

## 2022-01-22 DIAGNOSIS — C44722 Squamous cell carcinoma of skin of right lower limb, including hip: Secondary | ICD-10-CM

## 2022-01-22 DIAGNOSIS — D0472 Carcinoma in situ of skin of left lower limb, including hip: Secondary | ICD-10-CM | POA: Diagnosis not present

## 2022-01-22 DIAGNOSIS — D0471 Carcinoma in situ of skin of right lower limb, including hip: Secondary | ICD-10-CM

## 2022-01-22 DIAGNOSIS — C44629 Squamous cell carcinoma of skin of left upper limb, including shoulder: Secondary | ICD-10-CM

## 2022-01-22 NOTE — Progress Notes (Unsigned)
   Follow-Up Visit   Subjective  [name] is a 88 y.o. female who presents for the following: Follow-up (Patient here today for 1 month AK and biopsy follow up. ) and Procedure (EDC for bx proven SCCis at left lateral heel.).  ***  The following portions of the chart were reviewed this encounter and updated as appropriate:       Review of Systems:  No other skin or systemic complaints except as noted in HPI or Assessment and Plan.  Objective  Well appearing patient in no apparent distress; mood and affect are within normal limits.  {[id number]::"D full examination was performed including scalp, head, eyes, ears, nose, lips, neck, chest, axillae, abdomen, back, buttocks, bilateral upper extremities, bilateral lower extremities, hands, feet, fingers, toes, fingernails, and toenails. All findings within normal limits unless otherwise noted below."}    Assessment & Plan   No follow-ups on file.

## 2022-01-22 NOTE — Progress Notes (Unsigned)
Follow-Up Visit   Subjective  [name] is a 88 y.o. female who presents for the following: Follow-up (Patient here today for 1 month AK and biopsy follow up. 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 ).   The following portions of the chart were reviewed this encounter and updated as appropriate:       Review of Systems:  No other skin or systemic complaints except as noted in HPI or Assessment and Plan.  Objective  Well appearing patient in no apparent distress; mood and affect are within normal limits.  A focused examination was performed including legs, arms, feet. Relevant physical exam findings are noted in the Assessment and Plan.  Left Forearm 0.5 cm red papule Pyogenic granuloma vs BCC vs other     Right Lower Leg Below Knee 0.7 cm cutaneous horn Hypertrophic AK r/o SCC      Right posterior heel 0.8 cm crusted nodule Hypertrophic AK r/o SCC         Left Lateral Heel Thickened hyperkeratotic crusted biopsy site    Assessment & Plan  Neoplasm of uncertain behavior of skin (3) Left Forearm  Skin / nail biopsy Type of biopsy: tangential   Informed consent: discussed and consent obtained   Patient was prepped and draped in usual sterile fashion: Area prepped with alcohol. Anesthesia: the lesion was anesthetized in a standard fashion   Anesthetic:  1% lidocaine w/ epinephrine 1-100,000 buffered w/ 8.4% NaHCO3 Instrument used: flexible razor blade   Hemostasis achieved with: pressure, aluminum chloride and electrodesiccation   Outcome: patient tolerated procedure well   Post-procedure details: wound care instructions given   Post-procedure details comment:  Ointment and small bandage applied  Specimen 1 - Surgical pathology Differential Diagnosis: Pyogenic granuloma vs BCC vs other  Check Margins: No 0.5 cm red papule   Right Lower Leg Below  Knee  Skin / nail biopsy Type of biopsy: tangential   Informed consent: discussed and consent obtained   Patient was prepped and draped in usual sterile fashion: Area prepped with alcohol. Anesthesia: the lesion was anesthetized in a standard fashion   Anesthetic:  1% lidocaine w/ epinephrine 1-100,000 buffered w/ 8.4% NaHCO3 Instrument used: flexible razor blade   Hemostasis achieved with: pressure, aluminum chloride and electrodesiccation   Outcome: patient tolerated procedure well   Post-procedure details: wound care instructions given   Post-procedure details comment:  Ointment and small bandage applied  Specimen 2 - Surgical pathology Differential Diagnosis: Hypertrophic AK r/o SCC  Check Margins: No 0.7 cm cutaneous horn   Right posterior heel  Skin / nail biopsy Type of biopsy: tangential   Informed consent: discussed and consent obtained   Patient was prepped and draped in usual sterile fashion: Area prepped with alcohol. Anesthesia: the lesion was anesthetized in a standard fashion   Anesthetic:  1% lidocaine w/ epinephrine 1-100,000 buffered w/ 8.4% NaHCO3 Instrument used: flexible razor blade   Hemostasis achieved with: pressure, aluminum chloride and electrodesiccation   Outcome: patient tolerated procedure well   Post-procedure details: wound care instructions given   Post-procedure details comment:  Ointment and small bandage applied  Specimen 3 - Surgical pathology Differential Diagnosis: Hypertrophic AK r/o SCC  Check Margins: No 0.8 cm crusted nodule   Pt has multiple lesions which are suspicious for skin cancer on legs, trunk, arms.  She will need multiple surgery slot appts to do  biopsies/EDCs since more time is needed than available in  routine office visit.  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  - Call if any new or changing lesions are noted between office visits  Actinic Damage - chronic, secondary to cumulative UV radiation exposure/sun exposure over time - diffuse scaly erythematous macules with underlying dyspigmentation - Recommend daily broad spectrum sunscreen SPF 30+ to sun-exposed areas, reapply every 2 hours as needed.  - Recommend staying in the shade or wearing long sleeves, sun glasses (UVA+UVB protection) and wide brim hats (4-inch brim around the entire circumference of the hat). - Call for new or changing lesions.   Return for as scheduled for Biopsies, EDC's.  Graciella Belton, RMA, am acting as scribe for Brendolyn Patty, MD .  Documentation: I have reviewed the above documentation for accuracy and completeness, and I agree with the above.  Brendolyn Patty MD

## 2022-01-22 NOTE — Patient Instructions (Addendum)
Wound Care Instructions  Cleanse wound gently with soap and water once a day then pat dry with clean gauze. Apply a thin coat of Petrolatum (petroleum jelly, "Vaseline") over the wound (unless you have an allergy to this). We recommend that you use a new, sterile tube of Vaseline. Do not pick or remove scabs. Do not remove the yellow or white "healing tissue" from the base of the wound.  Cover the wound with fresh, clean, nonstick gauze and secure with paper tape. You may use Band-Aids in place of gauze and tape if the wound is small enough, but would recommend trimming much of the tape off as there is often too much. Sometimes Band-Aids can irritate the skin.  You should call the office for your biopsy report after 1 week if you have not already been contacted.  If you experience any problems, such as abnormal amounts of bleeding, swelling, significant bruising, significant pain, or evidence of infection, please call the office immediately.  FOR ADULT SURGERY PATIENTS: If you need something for pain relief you may take 1 extra strength Tylenol (acetaminophen) AND 2 Ibuprofen (200mg each) together every 4 hours as needed for pain. (do not take these if you are allergic to them or if you have a reason you should not take them.) Typically, you may only need pain medication for 1 to 3 days.     Due to recent changes in healthcare laws, you may see results of your pathology and/or laboratory studies on MyChart before the doctors have had a chance to review them. We understand that in some cases there may be results that are confusing or concerning to you. Please understand that not all results are received at the same time and often the doctors may need to interpret multiple results in order to provide you with the best plan of care or course of treatment. Therefore, we ask that you please give us 2 business days to thoroughly review all your results before contacting the office for clarification. Should  we see a critical lab result, you will be contacted sooner.   If You Need Anything After Your Visit  If you have any questions or concerns for your doctor, please call our main line at [phone] and press option 4 to reach your doctor's medical assistant. If no one answers, please leave a voicemail as directed and we will return your call as soon as possible. Messages left after 4 pm will be answered the following business day.   You may also send us a message via MyChart. We typically respond to MyChart messages within 1-2 business days.  For prescription refills, please ask your pharmacy to contact our office. Our fax number is [phone].  If you have an urgent issue when the clinic is closed that cannot wait until the next business day, you can page your doctor at the number below.    Please note that while we do our best to be available for urgent issues outside of office hours, we are not available 24/7.   If you have an urgent issue and are unable to reach us, you may choose to seek medical care at your doctor's office, retail clinic, urgent care center, or emergency room.  If you have a medical emergency, please immediately call 911 or go to the emergency department.  Pager Numbers  - Dr. Kowalski: [phone]  - Dr. Moye: [phone]  - Dr. Stewart: [phone]  In the event of inclement weather, please call our main line at   [phone] for an update on the status of any delays or closures.  Dermatology Medication Tips: Please keep the boxes that topical medications come in in order to help keep track of the instructions about where and how to use these. Pharmacies typically print the medication instructions only on the boxes and not directly on the medication tubes.   If your medication is too expensive, please contact our office at [phone] option 4 or send us a message through MyChart.   We are unable to tell what your co-pay for medications will be in  advance as this is different depending on your insurance coverage. However, we may be able to find a substitute medication at lower cost or fill out paperwork to get insurance to cover a needed medication.   If a prior authorization is required to get your medication covered by your insurance company, please allow us 1-2 business days to complete this process.  Drug prices often vary depending on where the prescription is filled and some pharmacies may offer cheaper prices.  The website www.goodrx.com contains coupons for medications through different pharmacies. The prices here do not account for what the cost may be with help from insurance (it may be cheaper with your insurance), but the website can give you the price if you did not use any insurance.  - You can print the associated coupon and take it with your prescription to the pharmacy.  - You may also stop by our office during regular business hours and pick up a GoodRx coupon card.  - If you need your prescription sent electronically to a different pharmacy, notify our office through [facility] MyChart or by phone at [phone] option 4.     Si Usted Necesita Algo Despus de Su Visita  Tambin puede enviarnos un mensaje a travs de MyChart. Por lo general respondemos a los mensajes de MyChart en el transcurso de 1 a 2 das hbiles.  Para renovar recetas, por favor pida a su farmacia que se ponga en contacto con nuestra oficina. Nuestro nmero de fax es el [phone].  Si tiene un asunto urgente cuando la clnica est cerrada y que no puede esperar hasta el siguiente da hbil, puede llamar/localizar a su doctor(a) al nmero que aparece a continuacin.   Por favor, tenga en cuenta que aunque hacemos todo lo posible para estar disponibles para asuntos urgentes fuera del horario de oficina, no estamos disponibles las 24 horas del da, los 7 das de la semana.   Si tiene un problema urgente y no puede comunicarse con nosotros, puede  optar por buscar atencin mdica  en el consultorio de su doctor(a), en una clnica privada, en un centro de atencin urgente o en una sala de emergencias.  Si tiene una emergencia mdica, por favor llame inmediatamente al 911 o vaya a la sala de emergencias.  Nmeros de bper  - Dr. Kowalski: [phone]  - Dra. Moye: [phone]  - Dra. Stewart: [phone]  En caso de inclemencias del tiempo, por favor llame a nuestra lnea principal al [phone] para una actualizacin sobre el estado de cualquier retraso o cierre.  Consejos para la medicacin en dermatologa: Por favor, guarde las cajas en las que vienen los medicamentos de uso tpico para ayudarle a seguir las instrucciones sobre dnde y cmo usarlos. Las farmacias generalmente imprimen las instrucciones del medicamento slo en las cajas y no directamente en los tubos del medicamento.   Si su medicamento es muy caro, por favor, pngase en contacto con   nuestra oficina llamando al [phone] y presione la opcin 4 o envenos un mensaje a travs de MyChart.   No podemos decirle cul ser su copago por los medicamentos por adelantado ya que esto es diferente dependiendo de la cobertura de su seguro. Sin embargo, es posible que podamos encontrar un medicamento sustituto a menor costo o llenar un formulario para que el seguro cubra el medicamento que se considera necesario.   Si se requiere una autorizacin previa para que su compaa de seguros cubra su medicamento, por favor permtanos de 1 a 2 das hbiles para completar este proceso.  Los precios de los medicamentos varan con frecuencia dependiendo del lugar de dnde se surte la receta y alguna farmacias pueden ofrecer precios ms baratos.  El sitio web www.goodrx.com tiene cupones para medicamentos de diferentes farmacias. Los precios aqu no tienen en cuenta lo que podra costar con la ayuda del seguro (puede ser ms barato con su seguro), pero el sitio web puede darle el  precio si no utiliz ningn seguro.  - Puede imprimir el cupn correspondiente y llevarlo con su receta a la farmacia.  - Tambin puede pasar por nuestra oficina durante el horario de atencin regular y recoger una tarjeta de cupones de GoodRx.  - Si necesita que su receta se enve electrnicamente a una farmacia diferente, informe a nuestra oficina a travs de MyChart de [place] o por telfono llamando al [phone] y presione la opcin 4.  

## 2022-01-28 ENCOUNTER — Telehealth: Payer: Self-pay

## 2022-01-28 NOTE — Telephone Encounter (Signed)
Advised pt of bx results.  Patient scheduled for EDC x 3./sh

## 2022-01-28 NOTE — Telephone Encounter (Signed)
-----   Message from Brendolyn Patty, MD sent at 01/28/2022  9:32 AM EDT ----- 1. Skin , left forearm MODERATELY DIFFERENTIATED SQUAMOUS CELL CARCINOMA 2. Skin , right lower leg below knee WELL DIFFERENTIATED SQUAMOUS CELL CARCINOMA 3. 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 SCC skin cancers, pt is scheduled for EDCs in a surgery slot 3. SCCIS- pt is scheduled for [facility] in a surgery slot   - please call patient

## 2022-02-04 ENCOUNTER — Ambulatory Visit: Payer: PPO | Admitting: Dermatology

## 2022-02-04 DIAGNOSIS — D485 Neoplasm of uncertain behavior of skin: Secondary | ICD-10-CM | POA: Diagnosis not present

## 2022-02-04 DIAGNOSIS — C4492 Squamous cell carcinoma of skin, unspecified: Secondary | ICD-10-CM

## 2022-02-04 DIAGNOSIS — C44722 Squamous cell carcinoma of skin of right lower limb, including hip: Secondary | ICD-10-CM | POA: Diagnosis not present

## 2022-02-04 DIAGNOSIS — D099 Carcinoma in situ, unspecified: Secondary | ICD-10-CM

## 2022-02-04 DIAGNOSIS — T148XXA Other injury of unspecified body region, initial encounter: Secondary | ICD-10-CM

## 2022-02-04 DIAGNOSIS — L578 Other skin changes due to chronic exposure to nonionizing radiation: Secondary | ICD-10-CM | POA: Diagnosis not present

## 2022-02-04 DIAGNOSIS — S8011XA Contusion of right lower leg, initial encounter: Secondary | ICD-10-CM

## 2022-02-04 DIAGNOSIS — L03115 Cellulitis of right lower limb: Secondary | ICD-10-CM | POA: Diagnosis not present

## 2022-02-04 DIAGNOSIS — D0472 Carcinoma in situ of skin of left lower limb, including hip: Secondary | ICD-10-CM | POA: Diagnosis not present

## 2022-02-04 DIAGNOSIS — D0471 Carcinoma in situ of skin of right lower limb, including hip: Secondary | ICD-10-CM

## 2022-02-04 DIAGNOSIS — C44629 Squamous cell carcinoma of skin of left upper limb, including shoulder: Secondary | ICD-10-CM | POA: Diagnosis not present

## 2022-02-04 MED ORDER — DOXYCYCLINE MONOHYDRATE 100 MG PO TABS: 100.0000 mg | ORAL_TABLET | Freq: Two times a day (BID) | ORAL | 0 refills | Status: DC

## 2022-02-04 MED ORDER — MUPIROCIN 2 % EX OINT: 1.0000 | TOPICAL_OINTMENT | Freq: Every day | CUTANEOUS | 1 refills | Status: DC

## 2022-02-04 NOTE — Patient Instructions (Addendum)
Wound Care Instructions  Cleanse wound gently with soap and water once a day then pat dry with clean gauze. Apply a thin coat of Petrolatum (petroleum jelly, "Vaseline") over the wound (unless you have an allergy to this). We recommend that you use a new, sterile tube of Vaseline. Do not pick or remove scabs. Do not remove the yellow or white "healing tissue" from the base of the wound.  Cover the wound with fresh, clean, nonstick gauze and secure with paper tape. You may use Band-Aids in place of gauze and tape if the wound is small enough, but would recommend trimming much of the tape off as there is often too much. Sometimes Band-Aids can irritate the skin.  You should call the office for your biopsy report after 1 week if you have not already been contacted.  If you experience any problems, such as abnormal amounts of bleeding, swelling, significant bruising, significant pain, or evidence of infection, please call the office immediately.  FOR ADULT SURGERY PATIENTS: If you need something for pain relief you may take 1 extra strength Tylenol (acetaminophen) AND 2 Ibuprofen ('200mg'$  each) together every 4 hours as needed for pain. (do not take these if you are allergic to them or if you have a reason you should not take them.) Typically, you may only need pain medication for 1 to 3 days.   Recommend Niacinamide or Nicotinamide '500mg'$  twice per day to lower risk of non-melanoma skin cancer by approximately 25%. This is usually available at [employer].   Due to recent changes in healthcare laws, you may see results of your pathology and/or laboratory studies on MyChart before the doctors have had a chance to review them. We understand that in some cases there may be results that are confusing or concerning to you. Please understand that not all results are received at the same time and often the doctors may need to interpret multiple results in order to provide you with the best plan of care or course  of treatment. Therefore, we ask that you please give [place] 2 business days to thoroughly review all your results before contacting the office for clarification. Should we see a critical lab result, you will be contacted sooner.   If You Need Anything After Your Visit  If you have any questions or concerns for your doctor, please call our main line at [phone] and press option 4 to reach your doctor's medical assistant. If no one answers, please leave a voicemail as directed and we will return your call as soon as possible. Messages left after 4 pm will be answered the following business day.   You may also send [place] a message via [place]. We typically respond to MyChart messages within 1-2 business days.  For prescription refills, please ask your pharmacy to contact our office. Our fax number is [phone].  If you have an urgent issue when the clinic is closed that cannot wait until the next business day, you can page your doctor at the number below.    Please note that while we do our best to be available for urgent issues outside of office hours, we are not available 24/7.   If you have an urgent issue and are unable to reach [place], you may choose to seek medical care at your doctor's office, retail clinic, urgent care center, or emergency room.  If you have a medical emergency, please immediately call 911 or go to the emergency department.  Pager Numbers  - Dr.  Nehemiah Massed: [phone]  - Dr. Laurence Ferrari: [phone]  - Dr. Nicole Kindred: [phone]  In the event of inclement weather, please call our main line at [phone] for an update on the status of any delays or closures.  Dermatology Medication Tips: Please keep the boxes that topical medications come in in order to help keep track of the instructions about where and how to use these. Pharmacies typically print the medication instructions only on the boxes and not directly on the medication tubes.   If your medication is too expensive,  please contact our office at [phone] option 4 or send [place] a message through [place].   We are unable to tell what your co-pay for medications will be in advance as this is different depending on your insurance coverage. However, we may be able to find a substitute medication at lower cost or fill out paperwork to get insurance to cover a needed medication.   If a prior authorization is required to get your medication covered by your insurance company, please allow [place] 1-2 business days to complete this process.  Drug prices often vary depending on where the prescription is filled and some pharmacies may offer cheaper prices.  The website www.goodrx.com contains coupons for medications through different pharmacies. The prices here do not account for what the cost may be with help from insurance (it may be cheaper with your insurance), but the website can give you the price if you did not use any insurance.  - You can print the associated coupon and take it with your prescription to the pharmacy.  - You may also stop by our office during regular business hours and pick up a GoodRx coupon card.  - If you need your prescription sent electronically to a different pharmacy, notify our office through [facility] or by phone at [phone] option 4.     Si Usted Necesita Algo Despus de Su Visita  Tambin puede enviarnos un mensaje a travs de Pharmacist, community. Por lo general respondemos a los mensajes de MyChart en el transcurso de 1 a 2 das hbiles.  Para renovar recetas, por favor pida a su farmacia que se ponga en contacto con nuestra oficina. Harland Dingwall de fax es [place] [phone].  Si tiene un asunto urgente cuando la clnica est cerrada y que no puede esperar hasta el siguiente da hbil, puede llamar/localizar a su doctor(a) al nmero que aparece a continuacin.   Por favor, tenga en cuenta que aunque hacemos todo lo posible para estar disponibles para asuntos urgentes fuera del  horario de [place], no estamos disponibles las 24 horas del da, los 7 das de la [place].   Si tiene un problema urgente y no puede comunicarse con nosotros, puede optar por buscar atencin mdica  en el consultorio de su doctor(a), en una clnica privada, en un centro de atencin urgente o en una sala de emergencias.  Si tiene Engineering geologist, por favor llame inmediatamente al 911 o vaya a la sala de emergencias.  Nmeros de bper  - Dr. Nehemiah Massed: [phone]  - Dra. Moye: [phone]  - Dra. Nicole Kindred: [phone]  En caso de inclemencias del [place], por favor llame a Johnsie Kindred principal al [phone] para una actualizacin sobre el [place] de cualquier retraso o cierre.  Consejos para la medicacin en dermatologa: Por favor, guarde las cajas en las que vienen los medicamentos de uso tpico para ayudarle a seguir las instrucciones sobre dnde y cmo usarlos. Las farmacias generalmente imprimen las instrucciones del medicamento slo  en las cajas y no directamente en los tubos del medicamento.   Si su medicamento es muy caro, por favor, pngase en contacto con Zigmund Daniel llamando al [phone] y presione la opcin 4 o envenos un mensaje a travs de Pharmacist, community.   No podemos decirle cul ser su copago por los medicamentos por adelantado ya que esto es diferente dependiendo de la cobertura de su seguro. Sin embargo, es posible que podamos encontrar un medicamento sustituto a Electrical engineer un formulario para que el seguro cubra el medicamento que se considera necesario.   Si se requiere una autorizacin previa para que su compaa de seguros [place] su medicamento, por favor permtanos de 1 a 2 das hbiles para completar este proceso.  Los precios de los medicamentos varan con frecuencia dependiendo del Environmental consultant de dnde se surte la receta y alguna farmacias pueden ofrecer precios ms baratos.  El sitio web www.goodrx.com tiene cupones para medicamentos de Office manager. Los precios aqu no tienen en cuenta lo que podra costar con la ayuda del seguro (puede ser ms barato con su seguro), pero el sitio web puede darle el precio si no utiliz Research scientist (physical sciences).  - Puede imprimir el cupn correspondiente y llevarlo con su receta a la farmacia.  - Tambin puede pasar por nuestra oficina durante el horario de atencin regular y Charity fundraiser una tarjeta de cupones de GoodRx.  - Si necesita que su receta se enve electrnicamente a una farmacia diferente, informe a nuestra oficina a travs de MyChart de [place] o por telfono llamando al [phone] y presione la opcin 4.

## 2022-02-04 NOTE — Progress Notes (Signed)
Follow-Up Visit   Subjective  [name] is a 88 y.o. female who presents for the following: Procedure (Patient here today to treat bx proven SCC and for additional biopsies. ).   The following portions of the chart were reviewed this encounter and updated as appropriate:       Review of Systems:  No other skin or systemic complaints except as noted in HPI or Assessment and Plan.  Objective  Well appearing patient in no apparent distress; mood and affect are within normal limits.  A focused examination was performed including legs, feet, abdomen. Relevant physical exam findings are noted in the Assessment and Plan.  left forearm Pink biopsy site  right lower leg below knee Pink biopsy site  Right Posterior Heel Pink biopsy site (post ankle)  Left Lateral Heel Pink biopsy site  Right Medial Knee 1.5 cm ulcerated patch R/o SCC     Left medial ankle 1.2 cm keratotic nodule R/o SCC     Left lateral foot dorsum 2.0 x 1.3 cm crusted plaque R/o SCC     Left Abdomen 1.0 cm pink scaly macule R/o BCC/SCCis     right pretibia 3 cm tense violaceous bullae c/w hematoma, superior is 0.5 cm pustule, surrounding mild erythema of pretibia.  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 Anesthesia: the lesion was anesthetized in a standard fashion   Anesthetic:  1% lidocaine w/ epinephrine 1-100,000 local infiltration Curettage performed in three different directions: Yes   Electrodesiccation performed over the curetted area: Yes   Final wound size (cm):   0.6 Hemostasis achieved with:  pressure, aluminum chloride and electrodesiccation Outcome: patient tolerated procedure well with no complications   Post-procedure details: wound care instructions given   Post-procedure details comment:  Ointment and small bandage applied  right lower leg below knee  Destruction of lesion  Destruction method: electrodesiccation and curettage   Informed consent: discussed and consent obtained   Timeout:  patient name, date of birth, surgical site, and procedure verified Patient was prepped and draped in usual sterile fashion: area prepped with alcohol. Anesthesia: the lesion was anesthetized in a standard fashion   Anesthetic:  1% lidocaine w/ epinephrine 1-100,000 local infiltration Curettage performed in three different directions: Yes   Electrodesiccation performed over the curetted area: Yes   Final wound size (cm):  1.4 Hemostasis achieved with:  pressure, aluminum chloride and electrodesiccation Outcome: patient tolerated procedure well with no complications   Post-procedure details: wound care instructions given   Post-procedure details comment:  Ointment and small bandage applied  Start mupirocin daily with dressing changes.  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 Anesthesia: the lesion was anesthetized in a standard fashion   Anesthetic:  1% lidocaine w/ epinephrine 1-100,000 local infiltration Curettage performed in three different directions: Yes   Electrodesiccation  performed over the curetted area: Yes   Final wound size (cm):  0.8 Hemostasis achieved with:  pressure, aluminum chloride and electrodesiccation Outcome:  patient tolerated procedure well with no complications   Post-procedure details: wound care instructions given   Post-procedure details comment:  Ointment and small bandage applied  Left Lateral Heel  Destruction of lesion  Destruction method: electrodesiccation and curettage   Informed consent: discussed and consent obtained   Timeout:  patient name, date of birth, surgical site, and procedure verified Patient was prepped and draped in usual sterile fashion: area prepped with alcohol. Anesthesia: the lesion was anesthetized in a standard fashion   Anesthetic:  1% lidocaine w/ epinephrine 1-100,000 local infiltration Curettage performed in three different directions: Yes   Electrodesiccation performed over the curetted area: Yes   Final wound size (cm):  1.1 Hemostasis achieved with:  pressure, aluminum chloride and electrodesiccation Outcome: patient tolerated procedure well with no complications   Post-procedure details: wound care instructions given   Post-procedure details comment:  Ointment and small bandage applied  Start mupirocin daily with dressing changes  Neoplasm of uncertain behavior of skin (4) Right Medial Knee  Left medial ankle  Left lateral foot dorsum  Left Abdomen  Plan biopsies and EDC on follow up  Hematoma right pretibia  2nd to recent trauma, with possible early cellulitis  Bacterial culture obtained today Start doxycycline monohydrate 100 mg bid x 10 days Start mupirocin ointment daily   I&D of Hematoma performed today, alcohol swab, 11 blade incision and massage. Deep purple fluid extracted with residual soft SQ nodule c/w clot.  Doxycycline should be taken with food to prevent nausea. Do not lay down for 30 minutes after taking. Be cautious with sun exposure and use good sun protection while on this medication. Pregnant women should not take this medication.    doxycycline (ADOXA) 100 MG tablet - right pretibia Take 1 tablet (100 mg total)  by mouth 2 (two) times daily for 10 days. Take with food  mupirocin ointment (BACTROBAN) 2 % - right pretibia Apply 1 Application topically daily.  Anaerobic and Aerobic Culture - right pretibia  Actinic Damage - chronic, secondary to cumulative UV radiation exposure/sun exposure over time - diffuse scaly erythematous macules with underlying dyspigmentation - Recommend daily broad spectrum sunscreen SPF 30+ to sun-exposed areas, reapply every 2 hours as needed.  - Recommend staying in the shade or wearing long sleeves, sun glasses (UVA+UVB protection) and wide brim hats (4-inch brim around the entire circumference of the hat). - Call for new or changing lesions.   Return for Biopsies, EDC, as scheduled surgery slot 2 weeks.  Graciella Belton, RMA, am acting as scribe for Brendolyn Patty, MD .  Documentation: I have reviewed the above documentation for accuracy and completeness, and I agree with the above.  Brendolyn Patty MD

## 2022-02-08 ENCOUNTER — Ambulatory Visit (INDEPENDENT_AMBULATORY_CARE_PROVIDER_SITE_OTHER): Payer: PPO | Admitting: Family Medicine

## 2022-02-08 VITALS — BP 110/50 | HR 72 | Temp 97.4°F

## 2022-02-08 DIAGNOSIS — J439 Emphysema, unspecified: Secondary | ICD-10-CM

## 2022-02-08 DIAGNOSIS — U071 COVID-19: Secondary | ICD-10-CM

## 2022-02-08 DIAGNOSIS — I5032 Chronic diastolic (congestive) heart failure: Secondary | ICD-10-CM

## 2022-02-08 MED ORDER — MOLNUPIRAVIR EUA 200MG CAPSULE: 4.0000 | ORAL_CAPSULE | Freq: Two times a day (BID) | ORAL | 0 refills | Status: DC

## 2022-02-08 MED ORDER — ALBUTEROL SULFATE HFA 108 (90 BASE) MCG/ACT IN AERS: 2.0000 | INHALATION_SPRAY | Freq: Four times a day (QID) | RESPIRATORY_TRACT | 2 refills | Status: DC | PRN

## 2022-02-08 MED ORDER — PREDNISONE 20 MG PO TABS: 40.0000 mg | ORAL_TABLET | Freq: Every day | ORAL | 0 refills | Status: DC

## 2022-02-08 NOTE — Assessment & Plan Note (Signed)
Given severe sore throat and poor air movement will do prednisone. Strict ER precautions discussed

## 2022-02-08 NOTE — Patient Instructions (Addendum)
Schedule within 1 month to see Dr. Diona Browner or Eugenia Pancoast    Try Mucinex for phelgm Drink as much water as you can  Tylenol for headache and ear ache  Saline rinse or neti pot  Treatment - Molnupiravir 4 pills twice daily for 5 days - Prednisone take daily for 5 days - inflammation in throat and lungs - Albuterol - as needed every 4-6 hours for wheezing or cough  Monitor blood pressure and ok to skip blood pressure and fluid pills if needed based on low blood pressure or no fluid build up  Skip Torsemide today Take tomorrow   ER if worsening shortness or breath or chest pain

## 2022-02-08 NOTE — Assessment & Plan Note (Signed)
Ok to hold torsemide today and reassess to give tomorrow.

## 2022-02-08 NOTE — Progress Notes (Signed)
Subjective:     [name] is a 88 y.o. female presenting for Covid Positive (Symptoms started on Tuesday. Tested + yesterday )     HPI   Did not take her cholesterol medication  Throat is very sore Hard to swallow Breathing ok - has oxygen at home if she needs it Ears and eyes are also painful  Lightheadedness is stable - did improve after iron infusion    Review of Systems   Social History   Tobacco Use  Smoking Status Former   Packs/day: 0.50   Years: 40.00   Total pack years: 20.00   Types: Cigarettes   Quit date: 05/06/2001   Years since quitting: 20.7  Smokeless Tobacco Never        Objective:    BP Readings from Last 3 Encounters:  02/08/22 (!) 110/50  01/09/22 136/75  01/02/22 (!) 149/72   Wt Readings from Last 3 Encounters:  01/09/22 146 lb 6.4 oz (66.4 kg)  01/02/22 145 lb 12.8 oz (66.1 kg)  12/26/21 149 lb 9.6 oz (67.9 kg)    BP (!) 110/50   Pulse 72   Temp (!) 97.4 F (36.3 C) (Temporal)   SpO2 97%    Physical Exam Constitutional:      General: She is not in acute distress.    Appearance: She is well-developed. She is not diaphoretic.  HENT:     Right Ear: External ear normal. There is impacted cerumen.     Left Ear: External ear normal. There is impacted cerumen.     Nose: Nose normal.     Mouth/Throat:     Mouth: Mucous membranes are moist.     Pharynx: Posterior oropharyngeal erythema present.  Eyes:     General: No scleral icterus.    Conjunctiva/sclera: Conjunctivae normal.  Cardiovascular:     Rate and Rhythm: Normal rate.     Heart sounds: Heart sounds are distant. Murmur heard.  Pulmonary:     Effort: Pulmonary effort is normal.     Breath sounds: Decreased air movement present. Wheezing and rhonchi present.  Musculoskeletal:     Cervical back: Neck supple.  Skin:    General: Skin is warm and dry.     Capillary Refill: Capillary refill takes less than 2 seconds.  Neurological:     Mental Status: She is  alert. Mental status is at baseline.  Psychiatric:        Mood and Affect: Mood normal.        Behavior: Behavior normal.           Assessment & Plan:   Problem List Items Addressed This Visit       Cardiovascular and Mediastinum   Chronic diastolic CHF (congestive heart failure) ([place])    Ok to hold torsemide today and reassess to give tomorrow.         Respiratory   COPD (chronic obstructive pulmonary disease) ([facility])    Given severe sore throat and poor air movement will do prednisone. Strict ER precautions discussed      Relevant Medications   predniSONE (DELTASONE) 20 MG tablet   albuterol (VENTOLIN HFA) 108 (90 Base) MCG/ACT inhaler   Other Visit Diagnoses     COVID-19 virus infection    -  Primary   Relevant Medications   molnupiravir EUA (LAGEVRIO) 200 mg CAPS capsule   predniSONE (DELTASONE) 20 MG tablet   albuterol (VENTOLIN HFA) 108 (90 Base) MCG/ACT inhaler      Patient is  at increased risk for developing severe covid due to age, copd, chf, htn. They are eligible for anti-viral medication.   Hx of intolerance to many medications. Will do molnupiriavir due to lower side effect risk   Lab Results  Component Value Date   ALT 8 12/20/2021   AST 16 12/20/2021   ALKPHOS 52 12/20/2021   BILITOT 0.6 12/20/2021    Lab Results  Component Value Date   CREATININE 1.63 (H) 12/20/2021     Medication sent to pharmacy  Reviewed ER and return precautions  Reviewed isolation guidelines.    Return in about 4 weeks (around 03/08/2022), or if symptoms worsen or fail to improve, for TOC with Dr. Diona Browner or Lawerance Bach.  Lesleigh Noe, MD

## 2022-02-11 ENCOUNTER — Telehealth: Payer: Self-pay

## 2022-02-11 LAB — ANAEROBIC AND AEROBIC CULTURE

## 2022-02-11 NOTE — Telephone Encounter (Signed)
-----   Message from Brendolyn Patty, MD sent at 02/11/2022  2:52 PM EDT ----- Bacterial culture negative   - please call patient

## 2022-02-11 NOTE — Telephone Encounter (Signed)
Advised patient bacterial culture of the right pretibia was negative. Patient to keep follow-up appointment.

## 2022-02-12 ENCOUNTER — Ambulatory Visit: Payer: PPO | Admitting: Family Medicine

## 2022-02-12 ENCOUNTER — Encounter: Payer: Self-pay | Admitting: Family Medicine

## 2022-02-12 ENCOUNTER — Telehealth: Payer: Self-pay | Admitting: Family Medicine

## 2022-02-12 ENCOUNTER — Ambulatory Visit (INDEPENDENT_AMBULATORY_CARE_PROVIDER_SITE_OTHER): Payer: PPO | Admitting: Family Medicine

## 2022-02-12 VITALS — BP 138/72 | HR 61 | Temp 97.0°F | Ht 66.5 in | Wt 142.0 lb

## 2022-02-12 DIAGNOSIS — R195 Other fecal abnormalities: Secondary | ICD-10-CM | POA: Diagnosis not present

## 2022-02-12 DIAGNOSIS — U071 COVID-19: Secondary | ICD-10-CM | POA: Insufficient documentation

## 2022-02-12 DIAGNOSIS — K5521 Angiodysplasia of colon with hemorrhage: Secondary | ICD-10-CM | POA: Diagnosis not present

## 2022-02-12 HISTORY — DX: COVID-19: U07.1

## 2022-02-12 NOTE — Telephone Encounter (Signed)
I spoke with pt; pt saw Dr Einar Pheasant on 02/08/22; pt was + covid; pt said on 02/10/22 she was feeling better. On 02/11/22 pt began to feel worse; extreme weakness,pt said yesterday it was difficult for pt to even go to restroom due to weakness. Pt said no appetite; pt is trying to drink a lot of water. Pt does not have fever. Pt said when she coughs her ears hurt and also when has prod cough with clear phlegm has mid chest discomfort. Pt said no more SOB than usual. Pt said she has one kidney, CHF,COPD and she is concerned about taking molnupiravir. Pt last took molnupiravir on 02/10/22. Pt said the S/T is much better.pt is using albuterol inhaler and taking mucinex also.  No BP 104/63 P 60. Pt has a pacemaker. Pt said last several days oxygen level has been 94 - 97 % on room air. Pt scheduled appt with Dr Darnell Level 02/12/22 at 4 PM with UC & ED precautions and pt voiced understanding. Sending note to Dr Darnell Level and Lattie Haw CMA. And will teams [facility] CMA who is working with Dr Darnell Level today.

## 2022-02-12 NOTE — Telephone Encounter (Signed)
Patient daughter [name] called back in and stated to call Tinzlee instead at [phone]. Thank you!

## 2022-02-12 NOTE — Progress Notes (Signed)
Patient ID: [name], female    DOB: 1934-03-01, 88 y.o.   MRN: [id number]  This visit was conducted in person.  BP 138/72 (BP Location: Right Arm, Patient Position: Sitting, Cuff Size: Normal)   Pulse 61   Temp (!) 97 F (36.1 C)   Ht 5' 6.5" (1.689 m)   Wt 142 lb (64.4 kg)   SpO2 98%   BMI 22.58 kg/m    CC: COVID infection Subjective:   HPI: [name] is a 88 y.o. female presenting on 02/12/2022 for Covid Positive (Tested positive 02-07-22 at home. Saw Dr Einar Pheasant on 10-6 and was given anti-viral. She was feeling better then took a turn for the worse yesterday. Sore throat and ear pain is better. Still very weak.)   First day of symptoms: 02/05/2022 Tested COVID positive: 02/07/2022  Saw Dr Einar Pheasant 10/6, started on antiviral molnupiravir as well as prednisone 5d burst and albuterol inhaler. She also took mucinex, used supplemental oxygen available at home. She was starting to feel better but yesterday felt she took a turn for the worse. Today is day 7 of symptoms. She felt BP dropped while on molnupiravir.  Headache is better, ear pain and ST is better.   Current symptoms: ongoing marked fatigue/weakness which is predominant concern, productive cough of clear mucous. No appetite - barely eating any solid food. She is staying well hydrated with plenty of water.   By the way - had some episodes of diarrhea which initially improved, then today had several episodes of black watery stools. No pepto bismol use, not on oral iron at this time. Chronic dizziness.  Has upcoming GI lab appt 02/25/2022.  She is on eliquis 2.'5mg'$  bid.  She has h/o duodenal AVM   No: blood in urine, wheezing, dyspnea, loss of taste or smell.  Treatments to date: see above  Risk factors include: NICM, afib, COPD, CKD stage 3b, HTN, PAD, chronic dCHF, h/o renal cel cacner, and pulm hypertension.   COVID vaccination status: Pfizer x2.   2 months ago needed 2 iron infusions.      Relevant past  medical, surgical, family and social history reviewed and updated as indicated. Interim medical history since our last visit reviewed. Allergies and medications reviewed and updated. Outpatient Medications Prior to Visit  Medication Sig Dispense Refill   acetaminophen (TYLENOL) 500 MG tablet Take 500-1,000 mg by mouth every 8 (eight) hours as needed for mild pain or headache.     albuterol (VENTOLIN HFA) 108 (90 Base) MCG/ACT inhaler Inhale 2 puffs into the lungs every 6 (six) hours as needed for wheezing or shortness of breath. 8 g 2   Cholecalciferol (VITAMIN D) 50 MCG (2000 UT) CAPS Take 4,000 Units by mouth daily.     clobetasol cream (TEMOVATE) 0.05 % Apply to affected area left foot twice a day until improved. Avoid face, groin, axilla. 30 g 0   Cranberry (AZO CRANBERRY GUMMIES) 500 MG CHEW Chew 2,000 mg by mouth daily.     Cyanocobalamin (B-12 PO) Take 1 tablet by mouth daily.      ELIQUIS 2.5 MG TABS tablet TAKE 1 TABLET BY MOUTH TWICE A DAY 60 tablet 11   ezetimibe (ZETIA) 10 MG tablet Take 1 tablet (10 mg total) by mouth daily. 30 tablet 11   ferrous sulfate 325 (65 FE) MG tablet Take 1 tablet (325 mg total) by mouth 2 (two) times daily with a meal. 60 tablet 0   fluticasone (FLONASE) 50 MCG/ACT  nasal spray Place 2 sprays into both nostrils daily as needed for allergies or rhinitis. 16 g 6   levothyroxine (SYNTHROID) 25 MCG tablet TAKE 1 TABLET BY MOUTH ONCE DAILY BEFOREBREAKFAST 30 tablet 3   magnesium oxide (MAG-OX) 400 MG tablet Take 1 tablet (400 mg total) by mouth daily. 30 tablet 2   metolazone (ZAROXOLYN) 2.5 MG tablet Take 1 tablet (2.5 mg total) by mouth as directed. Take every other Thursday with your morning dose of Torsemide (Patient taking differently: Take 2.5 mg by mouth once a week. Take Thursday with your morning dose of Torsemide) 60 tablet 2   mupirocin ointment (BACTROBAN) 2 % Apply 1 Application topically daily. 22 g 1   octreotide (SANDOSTATIN LAR) 10 MG injection  Inject 10 mg into the muscle every 28 (twenty-eight) days. 1 each 5   ondansetron (ZOFRAN) 4 MG tablet Take 1 tablet (4 mg total) by mouth every 8 (eight) hours as needed for nausea or vomiting. 20 tablet 1   pantoprazole (PROTONIX) 40 MG tablet Take 1 tablet (40 mg total) by mouth 2 (two) times daily before a meal. 60 tablet 0   potassium chloride SA (KLOR-CON M) 20 MEQ tablet TAKE 2 TABLETS (40 MEQ) BY MOUTH 2 TIMESDAILY. TAKE 1 EXTRA TABLET ON THE DAYS YOU TAKE METOLAZONE. 360 tablet 3   predniSONE (DELTASONE) 20 MG tablet Take 2 tablets (40 mg total) by mouth daily with breakfast for 5 days. 10 tablet 0   rosuvastatin (CRESTOR) 40 MG tablet Take 1 tablet (40 mg total) by mouth daily. 90 tablet 3   torsemide (DEMADEX) 100 MG tablet TAKE 1 TABLET BY MOUTH EVERY MORNING (EVENING DOSE 80 MG) 30 tablet 11   vitamin C (ASCORBIC ACID) 250 MG tablet Take 500 mg by mouth daily.     doxycycline (ADOXA) 100 MG tablet Take 1 tablet (100 mg total) by mouth 2 (two) times daily for 10 days. Take with food (Patient not taking: Reported on 02/12/2022) 20 tablet 0   molnupiravir EUA (LAGEVRIO) 200 mg CAPS capsule Take 4 capsules (800 mg total) by mouth 2 (two) times daily for 5 days. 40 capsule 0   No facility-administered medications prior to visit.     Per HPI unless specifically indicated in ROS section below Review of Systems  Objective:  BP 138/72 (BP Location: Right Arm, Patient Position: Sitting, Cuff Size: Normal)   Pulse 61   Temp (!) 97 F (36.1 C)   Ht 5' 6.5" (1.689 m)   Wt 142 lb (64.4 kg)   SpO2 98%   BMI 22.58 kg/m   Wt Readings from Last 3 Encounters:  02/12/22 142 lb (64.4 kg)  01/09/22 146 lb 6.4 oz (66.4 kg)  01/02/22 145 lb 12.8 oz (66.1 kg)      Physical Exam Vitals and nursing note reviewed.  Constitutional:      Appearance: Normal appearance. She is not ill-appearing.     Comments: Tired appearing, sitting in wheelchair  HENT:     Head: Normocephalic and atraumatic.      Right Ear: Ear canal and external ear normal. There is no impacted cerumen.     Left Ear: Ear canal and external ear normal. There is no impacted cerumen.     Ears:     Comments: Cerumen covering TMs bilaterally    Mouth/Throat:     Comments: Mask in place Eyes:     Extraocular Movements: Extraocular movements intact.     Conjunctiva/sclera: Conjunctivae normal.  Pupils: Pupils are equal, round, and reactive to light.  Cardiovascular:     Rate and Rhythm: Normal rate and regular rhythm.     Pulses: Normal pulses.     Heart sounds: Murmur (3/6 systolic) heard.  Pulmonary:     Effort: Pulmonary effort is normal. No respiratory distress.     Breath sounds: Normal breath sounds. No wheezing, rhonchi or rales.     Comments: Lungs clear Musculoskeletal:     Right lower leg: No edema.     Left lower leg: No edema.  Lymphadenopathy:     Head:     Right side of head: No submental, submandibular, tonsillar, preauricular or posterior auricular adenopathy.     Left side of head: No submental, submandibular, tonsillar, preauricular or posterior auricular adenopathy.     Cervical: No cervical adenopathy.     Right cervical: No superficial cervical adenopathy.    Left cervical: No superficial cervical adenopathy.     Upper Body:     Right upper body: No supraclavicular adenopathy.     Left upper body: No supraclavicular adenopathy.  Skin:    General: Skin is warm and dry.     Coloration: Skin is not pale.     Findings: No rash.  Neurological:     Mental Status: She is alert.  Psychiatric:        Mood and Affect: Mood normal.        Behavior: Behavior normal.       Results for orders placed or performed in visit on 02/04/22  Anaerobic and Aerobic Culture   Specimen: Abscess   Abscess  Result Value Ref Range   Anaerobic Culture Final report    Result 1 Comment    Aerobic Culture Final report    Result 1 Comment    *Note: Due to a large number of results and/or encounters for  the requested time period, some results have not been displayed. A complete set of results can be found in Results Review.    Assessment & Plan:   Problem List Items Addressed This Visit     AVM (arteriovenous malformation) of small bowel, acquired with hemorrhage    Known h/o this, planned GI f/u with labs later this month.  With new black stools in setting of COVID infection, recommend further labwork today including CBC, iron panel, ferritin, and notify GI of this.  She is not on pepto bismol or oral iron at this time.  Reviewed red flags to seek ER care.       Relevant Orders   CBC with Differential/Platelet   Comprehensive metabolic panel   Transferrin   Iron   Ferritin   Dark stools   COVID-19 virus infection - Primary    Day 7 of symptoms. Lungs clear. Not consistent with COVID pneumonia. Anticipate weakness/fatigue due to COVID + hypotension - agree with holding torsemide/metolazone for the next 1-2 days until she is feeling better and PO intake has returned to normal. Update with symptoms over next 2-3 days, reviewed red flags to seek urgent in-person care.         No orders of the defined types were placed in this encounter.  Orders Placed This Encounter  Procedures   CBC with Differential/Platelet   Comprehensive metabolic panel   Transferrin   Iron   Ferritin     Patient Instructions  Labs today  Stay off molnupiravir  Finish prednisone course.  Take vitamin D 1000 units daily and zinc '100mg'$  daily.  Restart pantoprazole '40mg'$  twice daily, consider adding pepcid '20mg'$  twice daily as well.  Call GI to notify them about black stools.  If ongoing black stools, hold eliquis. If feeling worse, go to the hospital.   Follow up plan: No follow-ups on file.  Ria Bush, MD

## 2022-02-12 NOTE — Assessment & Plan Note (Signed)
Known h/o this, planned GI f/u with labs later this month.  With new black stools in setting of COVID infection, recommend further labwork today including CBC, iron panel, ferritin, and notify GI of this.  She is not on pepto bismol or oral iron at this time.  Reviewed red flags to seek ER care.

## 2022-02-12 NOTE — Assessment & Plan Note (Signed)
Day 7 of symptoms. Lungs clear. Not consistent with COVID pneumonia. Anticipate weakness/fatigue due to COVID + hypotension - agree with holding torsemide/metolazone for the next 1-2 days until she is feeling better and PO intake has returned to normal. Update with symptoms over next 2-3 days, reviewed red flags to seek urgent in-person care.

## 2022-02-12 NOTE — Patient Instructions (Signed)
Labs today  Stay off molnupiravir  Finish prednisone course.  Take vitamin D 1000 units daily and zinc '100mg'$  daily.  Restart pantoprazole '40mg'$  twice daily, consider adding pepcid '20mg'$  twice daily as well.  Call GI to notify them about black stools.  If ongoing black stools, hold eliquis. If feeling worse, go to the hospital.

## 2022-02-12 NOTE — Telephone Encounter (Signed)
Pt's daughter, [name] stating the pt tested pos for covid & is currently taking molnupiravir EUA (LAGEVRIO) 200 mg CAPS capsule & predniSONE (DELTASONE) 20 MG tablet. [name] stated on Sunday, 02/10/22, the pt was doing better as far as symptoms & yesterday, 02/11/22, the pt's symptoms started back up, pt just doesn't feel good. Pt thinks the meds, molnupiravir EUA (LAGEVRIO) 200 mg CAPS capsule, is what's causing her to feel bad so pt hasn't taken meds since Saturday, 02/09/22 & is wondering is that fine to stop medication or should she continue? Call back # [phone]

## 2022-02-12 NOTE — Telephone Encounter (Signed)
Today is day 7 of COVID. Will see this afternoon.

## 2022-02-13 ENCOUNTER — Encounter (HOSPITAL_COMMUNITY): Payer: Self-pay

## 2022-02-13 ENCOUNTER — Telehealth: Payer: Self-pay

## 2022-02-13 ENCOUNTER — Emergency Department (HOSPITAL_COMMUNITY): Payer: PPO

## 2022-02-13 ENCOUNTER — Inpatient Hospital Stay (HOSPITAL_COMMUNITY)
Admission: EM | Admit: 2022-02-13 | Discharge: 2022-03-01 | DRG: 829 | Disposition: A | Discharge status: Home Health | Payer: PPO | Attending: Internal Medicine | Admitting: Internal Medicine

## 2022-02-13 DIAGNOSIS — J1282 Pneumonia due to coronavirus disease 2019: Secondary | ICD-10-CM | POA: Diagnosis present

## 2022-02-13 DIAGNOSIS — U071 COVID-19: Secondary | ICD-10-CM | POA: Diagnosis not present

## 2022-02-13 DIAGNOSIS — R0602 Shortness of breath: Secondary | ICD-10-CM | POA: Diagnosis not present

## 2022-02-13 DIAGNOSIS — J449 Chronic obstructive pulmonary disease, unspecified: Secondary | ICD-10-CM | POA: Diagnosis not present

## 2022-02-13 DIAGNOSIS — C7A8 Other malignant neuroendocrine tumors: Principal | ICD-10-CM | POA: Diagnosis present

## 2022-02-13 DIAGNOSIS — E785 Hyperlipidemia, unspecified: Secondary | ICD-10-CM | POA: Diagnosis present

## 2022-02-13 DIAGNOSIS — Z66 Do not resuscitate: Secondary | ICD-10-CM | POA: Diagnosis not present

## 2022-02-13 DIAGNOSIS — Z7901 Long term (current) use of anticoagulants: Secondary | ICD-10-CM

## 2022-02-13 DIAGNOSIS — K8689 Other specified diseases of pancreas: Secondary | ICD-10-CM

## 2022-02-13 DIAGNOSIS — R54 Age-related physical debility: Secondary | ICD-10-CM | POA: Diagnosis present

## 2022-02-13 DIAGNOSIS — N183 Chronic kidney disease, stage 3 unspecified: Secondary | ICD-10-CM | POA: Diagnosis present

## 2022-02-13 DIAGNOSIS — I2510 Atherosclerotic heart disease of native coronary artery without angina pectoris: Secondary | ICD-10-CM | POA: Diagnosis present

## 2022-02-13 DIAGNOSIS — K31819 Angiodysplasia of stomach and duodenum without bleeding: Secondary | ICD-10-CM | POA: Diagnosis present

## 2022-02-13 DIAGNOSIS — J439 Emphysema, unspecified: Secondary | ICD-10-CM

## 2022-02-13 DIAGNOSIS — M6281 Muscle weakness (generalized): Secondary | ICD-10-CM | POA: Diagnosis not present

## 2022-02-13 DIAGNOSIS — F4322 Adjustment disorder with anxiety: Secondary | ICD-10-CM | POA: Diagnosis present

## 2022-02-13 DIAGNOSIS — Z79899 Other long term (current) drug therapy: Secondary | ICD-10-CM

## 2022-02-13 DIAGNOSIS — I5033 Acute on chronic diastolic (congestive) heart failure: Secondary | ICD-10-CM | POA: Diagnosis not present

## 2022-02-13 DIAGNOSIS — I428 Other cardiomyopathies: Secondary | ICD-10-CM | POA: Diagnosis not present

## 2022-02-13 DIAGNOSIS — Z905 Acquired absence of kidney: Secondary | ICD-10-CM

## 2022-02-13 DIAGNOSIS — N189 Chronic kidney disease, unspecified: Secondary | ICD-10-CM | POA: Diagnosis present

## 2022-02-13 DIAGNOSIS — E039 Hypothyroidism, unspecified: Secondary | ICD-10-CM | POA: Diagnosis not present

## 2022-02-13 DIAGNOSIS — E876 Hypokalemia: Secondary | ICD-10-CM | POA: Diagnosis not present

## 2022-02-13 DIAGNOSIS — K449 Diaphragmatic hernia without obstruction or gangrene: Secondary | ICD-10-CM | POA: Diagnosis not present

## 2022-02-13 DIAGNOSIS — Z87440 Personal history of urinary (tract) infections: Secondary | ICD-10-CM

## 2022-02-13 DIAGNOSIS — N3941 Urge incontinence: Secondary | ICD-10-CM | POA: Diagnosis present

## 2022-02-13 DIAGNOSIS — K2970 Gastritis, unspecified, without bleeding: Secondary | ICD-10-CM | POA: Diagnosis not present

## 2022-02-13 DIAGNOSIS — I4821 Permanent atrial fibrillation: Secondary | ICD-10-CM | POA: Diagnosis present

## 2022-02-13 DIAGNOSIS — J90 Pleural effusion, not elsewhere classified: Secondary | ICD-10-CM | POA: Diagnosis not present

## 2022-02-13 DIAGNOSIS — D62 Acute posthemorrhagic anemia: Secondary | ICD-10-CM

## 2022-02-13 DIAGNOSIS — M19042 Primary osteoarthritis, left hand: Secondary | ICD-10-CM | POA: Diagnosis present

## 2022-02-13 DIAGNOSIS — C259 Malignant neoplasm of pancreas, unspecified: Secondary | ICD-10-CM

## 2022-02-13 DIAGNOSIS — I6523 Occlusion and stenosis of bilateral carotid arteries: Secondary | ICD-10-CM | POA: Diagnosis not present

## 2022-02-13 DIAGNOSIS — Z95810 Presence of automatic (implantable) cardiac defibrillator: Secondary | ICD-10-CM | POA: Diagnosis present

## 2022-02-13 DIAGNOSIS — I959 Hypotension, unspecified: Secondary | ICD-10-CM | POA: Diagnosis not present

## 2022-02-13 DIAGNOSIS — Z7952 Long term (current) use of systemic steroids: Secondary | ICD-10-CM

## 2022-02-13 DIAGNOSIS — Z85528 Personal history of other malignant neoplasm of kidney: Secondary | ICD-10-CM

## 2022-02-13 DIAGNOSIS — J440 Chronic obstructive pulmonary disease with acute lower respiratory infection: Secondary | ICD-10-CM | POA: Diagnosis present

## 2022-02-13 DIAGNOSIS — R627 Adult failure to thrive: Secondary | ICD-10-CM | POA: Diagnosis present

## 2022-02-13 DIAGNOSIS — K921 Melena: Secondary | ICD-10-CM | POA: Diagnosis not present

## 2022-02-13 DIAGNOSIS — I130 Hypertensive heart and chronic kidney disease with heart failure and stage 1 through stage 4 chronic kidney disease, or unspecified chronic kidney disease: Secondary | ICD-10-CM | POA: Diagnosis present

## 2022-02-13 DIAGNOSIS — K558 Other vascular disorders of intestine: Secondary | ICD-10-CM | POA: Diagnosis not present

## 2022-02-13 DIAGNOSIS — I350 Nonrheumatic aortic (valve) stenosis: Secondary | ICD-10-CM | POA: Diagnosis not present

## 2022-02-13 DIAGNOSIS — N179 Acute kidney failure, unspecified: Secondary | ICD-10-CM | POA: Diagnosis present

## 2022-02-13 DIAGNOSIS — Z87891 Personal history of nicotine dependence: Secondary | ICD-10-CM

## 2022-02-13 DIAGNOSIS — R230 Cyanosis: Secondary | ICD-10-CM | POA: Diagnosis present

## 2022-02-13 DIAGNOSIS — M19041 Primary osteoarthritis, right hand: Secondary | ICD-10-CM | POA: Diagnosis present

## 2022-02-13 DIAGNOSIS — Z90710 Acquired absence of both cervix and uterus: Secondary | ICD-10-CM

## 2022-02-13 DIAGNOSIS — Z9841 Cataract extraction status, right eye: Secondary | ICD-10-CM

## 2022-02-13 DIAGNOSIS — I5032 Chronic diastolic (congestive) heart failure: Secondary | ICD-10-CM | POA: Diagnosis present

## 2022-02-13 DIAGNOSIS — K922 Gastrointestinal hemorrhage, unspecified: Secondary | ICD-10-CM | POA: Diagnosis not present

## 2022-02-13 DIAGNOSIS — Z79890 Hormone replacement therapy (postmenopausal): Secondary | ICD-10-CM

## 2022-02-13 DIAGNOSIS — Z9842 Cataract extraction status, left eye: Secondary | ICD-10-CM

## 2022-02-13 DIAGNOSIS — N1832 Chronic kidney disease, stage 3b: Secondary | ICD-10-CM | POA: Diagnosis present

## 2022-02-13 DIAGNOSIS — Z882 Allergy status to sulfonamides status: Secondary | ICD-10-CM

## 2022-02-13 DIAGNOSIS — D649 Anemia, unspecified: Secondary | ICD-10-CM | POA: Diagnosis present

## 2022-02-13 DIAGNOSIS — R5383 Other fatigue: Secondary | ICD-10-CM | POA: Diagnosis not present

## 2022-02-13 DIAGNOSIS — L570 Actinic keratosis: Secondary | ICD-10-CM | POA: Diagnosis present

## 2022-02-13 DIAGNOSIS — R58 Hemorrhage, not elsewhere classified: Secondary | ICD-10-CM | POA: Diagnosis not present

## 2022-02-13 DIAGNOSIS — K2971 Gastritis, unspecified, with bleeding: Secondary | ICD-10-CM | POA: Diagnosis not present

## 2022-02-13 DIAGNOSIS — I10 Essential (primary) hypertension: Secondary | ICD-10-CM | POA: Diagnosis present

## 2022-02-13 DIAGNOSIS — Z85828 Personal history of other malignant neoplasm of skin: Secondary | ICD-10-CM

## 2022-02-13 DIAGNOSIS — I21A1 Myocardial infarction type 2: Secondary | ICD-10-CM | POA: Diagnosis present

## 2022-02-13 DIAGNOSIS — I110 Hypertensive heart disease with heart failure: Secondary | ICD-10-CM | POA: Diagnosis not present

## 2022-02-13 DIAGNOSIS — I272 Pulmonary hypertension, unspecified: Secondary | ICD-10-CM | POA: Diagnosis present

## 2022-02-13 DIAGNOSIS — R778 Other specified abnormalities of plasma proteins: Secondary | ICD-10-CM | POA: Diagnosis not present

## 2022-02-13 DIAGNOSIS — Z8673 Personal history of transient ischemic attack (TIA), and cerebral infarction without residual deficits: Secondary | ICD-10-CM

## 2022-02-13 DIAGNOSIS — Z7189 Other specified counseling: Secondary | ICD-10-CM | POA: Diagnosis not present

## 2022-02-13 DIAGNOSIS — I4891 Unspecified atrial fibrillation: Secondary | ICD-10-CM | POA: Diagnosis not present

## 2022-02-13 DIAGNOSIS — T8092XA Unspecified transfusion reaction, initial encounter: Secondary | ICD-10-CM | POA: Diagnosis not present

## 2022-02-13 DIAGNOSIS — Z9049 Acquired absence of other specified parts of digestive tract: Secondary | ICD-10-CM

## 2022-02-13 DIAGNOSIS — E782 Mixed hyperlipidemia: Secondary | ICD-10-CM | POA: Diagnosis not present

## 2022-02-13 DIAGNOSIS — K222 Esophageal obstruction: Secondary | ICD-10-CM | POA: Diagnosis not present

## 2022-02-13 DIAGNOSIS — R000 Tachycardia, unspecified: Secondary | ICD-10-CM | POA: Diagnosis not present

## 2022-02-13 DIAGNOSIS — Z961 Presence of intraocular lens: Secondary | ICD-10-CM | POA: Diagnosis present

## 2022-02-13 DIAGNOSIS — R0902 Hypoxemia: Secondary | ICD-10-CM | POA: Diagnosis not present

## 2022-02-13 DIAGNOSIS — I739 Peripheral vascular disease, unspecified: Secondary | ICD-10-CM | POA: Diagnosis not present

## 2022-02-13 DIAGNOSIS — I509 Heart failure, unspecified: Secondary | ICD-10-CM | POA: Diagnosis not present

## 2022-02-13 DIAGNOSIS — Z885 Allergy status to narcotic agent status: Secondary | ICD-10-CM

## 2022-02-13 DIAGNOSIS — Z4789 Encounter for other orthopedic aftercare: Secondary | ICD-10-CM | POA: Diagnosis not present

## 2022-02-13 DIAGNOSIS — Z8249 Family history of ischemic heart disease and other diseases of the circulatory system: Secondary | ICD-10-CM

## 2022-02-13 DIAGNOSIS — R2689 Other abnormalities of gait and mobility: Secondary | ICD-10-CM | POA: Diagnosis not present

## 2022-02-13 DIAGNOSIS — Z96643 Presence of artificial hip joint, bilateral: Secondary | ICD-10-CM | POA: Diagnosis present

## 2022-02-13 DIAGNOSIS — C7B8 Other secondary neuroendocrine tumors: Secondary | ICD-10-CM | POA: Diagnosis not present

## 2022-02-13 DIAGNOSIS — R531 Weakness: Secondary | ICD-10-CM | POA: Diagnosis not present

## 2022-02-13 DIAGNOSIS — Z515 Encounter for palliative care: Secondary | ICD-10-CM | POA: Diagnosis not present

## 2022-02-13 LAB — CBC WITH DIFFERENTIAL/PLATELET
Abs Immature Granulocytes: 0.09 10*3/uL — ABNORMAL HIGH (ref 0.00–0.07)
Absolute Monocytes: 364 cells/uL (ref 200–950)
Basophils Absolute: 10 cells/uL (ref 0–200)
Basophils Absolute: 0 10*3/uL (ref 0.0–0.1)
Basophils Relative: 0.4 %
Basophils Relative: 0 %
Eosinophils Absolute: 0 cells/uL — ABNORMAL LOW (ref 15–500)
Eosinophils Absolute: 0 10*3/uL (ref 0.0–0.5)
Eosinophils Relative: 0 %
Eosinophils Relative: 0 %
HCT: 26.7 % — ABNORMAL LOW (ref 35.0–45.0)
HCT: 23.8 % — ABNORMAL LOW (ref 36.0–46.0)
Hemoglobin: 8.9 g/dL — ABNORMAL LOW (ref 11.7–15.5)
Hemoglobin: 7.8 g/dL — ABNORMAL LOW (ref 12.0–15.0)
Immature Granulocytes: 3 %
Lymphocytes Relative: 12 %
Lymphs Abs: 364 cells/uL — ABNORMAL LOW (ref 850–3900)
Lymphs Abs: 0.4 10*3/uL — ABNORMAL LOW (ref 0.7–4.0)
MCH: 28 pg (ref 27.0–33.0)
MCH: 28.9 pg (ref 26.0–34.0)
MCHC: 33.3 g/dL (ref 32.0–36.0)
MCHC: 32.8 g/dL (ref 30.0–36.0)
MCV: 84 fL (ref 80.0–100.0)
MCV: 88.1 fL (ref 80.0–100.0)
MPV: 11.7 fL (ref 7.5–12.5)
Monocytes Absolute: 0.3 10*3/uL (ref 0.1–1.0)
Monocytes Relative: 14 %
Monocytes Relative: 9 %
Neutro Abs: 1862 cells/uL (ref 1500–7800)
Neutro Abs: 2.3 10*3/uL (ref 1.7–7.7)
Neutrophils Relative %: 71.6 %
Neutrophils Relative %: 76 %
Platelets: 144 10*3/uL (ref 140–400)
Platelets: 142 10*3/uL — ABNORMAL LOW (ref 150–400)
RBC: 3.18 10*6/uL — ABNORMAL LOW (ref 3.80–5.10)
RBC: 2.7 MIL/uL — ABNORMAL LOW (ref 3.87–5.11)
RDW: 16.1 % — ABNORMAL HIGH (ref 11.0–15.0)
RDW: 16.6 % — ABNORMAL HIGH (ref 11.5–15.5)
Total Lymphocyte: 14 %
WBC: 2.6 10*3/uL — ABNORMAL LOW (ref 3.8–10.8)
WBC: 3 10*3/uL — ABNORMAL LOW (ref 4.0–10.5)
nRBC: 0.7 % — ABNORMAL HIGH (ref 0.0–0.2)

## 2022-02-13 LAB — RETICULOCYTES
Immature Retic Fract: 40.5 % — ABNORMAL HIGH (ref 2.3–15.9)
RBC.: 2.73 MIL/uL — ABNORMAL LOW (ref 3.87–5.11)
Retic Count, Absolute: 36.6 10*3/uL (ref 19.0–186.0)
Retic Ct Pct: 1.3 % (ref 0.4–3.1)

## 2022-02-13 LAB — FERRITIN
Ferritin: 768 ng/mL — ABNORMAL HIGH (ref 16–288)
Ferritin: 629 ng/mL — ABNORMAL HIGH (ref 11–307)

## 2022-02-13 LAB — CBC
HCT: 23.7 % — ABNORMAL LOW (ref 36.0–46.0)
Hemoglobin: 7.7 g/dL — ABNORMAL LOW (ref 12.0–15.0)
MCH: 28.6 pg (ref 26.0–34.0)
MCHC: 32.5 g/dL (ref 30.0–36.0)
MCV: 88.1 fL (ref 80.0–100.0)
Platelets: 140 10*3/uL — ABNORMAL LOW (ref 150–400)
RBC: 2.69 MIL/uL — ABNORMAL LOW (ref 3.87–5.11)
RDW: 16.7 % — ABNORMAL HIGH (ref 11.5–15.5)
WBC: 2.8 10*3/uL — ABNORMAL LOW (ref 4.0–10.5)
nRBC: 1.1 % — ABNORMAL HIGH (ref 0.0–0.2)

## 2022-02-13 LAB — IRON AND TIBC
Iron: 21 ug/dL — ABNORMAL LOW (ref 28–170)
Saturation Ratios: 11 % (ref 10.4–31.8)
TIBC: 189 ug/dL — ABNORMAL LOW (ref 250–450)
UIBC: 168 ug/dL

## 2022-02-13 LAB — COMPREHENSIVE METABOLIC PANEL
AG Ratio: 1.4 (calc) (ref 1.0–2.5)
ALT: 11 U/L (ref 6–29)
ALT: 14 U/L (ref 0–44)
AST: 22 U/L (ref 10–35)
AST: 21 U/L (ref 15–41)
Albumin: 3.7 g/dL (ref 3.6–5.1)
Albumin: 3 g/dL — ABNORMAL LOW (ref 3.5–5.0)
Alkaline Phosphatase: 36 U/L — ABNORMAL LOW (ref 38–126)
Alkaline phosphatase (APISO): 42 U/L (ref 37–153)
Anion gap: 9 (ref 5–15)
BUN/Creatinine Ratio: 63 (calc) — ABNORMAL HIGH (ref 6–22)
BUN: 85 mg/dL — ABNORMAL HIGH (ref 7–25)
BUN: 68 mg/dL — ABNORMAL HIGH (ref 8–23)
CO2: 23 mmol/L (ref 20–32)
CO2: 27 mmol/L (ref 22–32)
Calcium: 9.2 mg/dL (ref 8.6–10.4)
Calcium: 8.5 mg/dL — ABNORMAL LOW (ref 8.9–10.3)
Chloride: 98 mmol/L (ref 98–110)
Chloride: 100 mmol/L (ref 98–111)
Creat: 1.34 mg/dL — ABNORMAL HIGH (ref 0.60–0.95)
Creatinine, Ser: 1.32 mg/dL — ABNORMAL HIGH (ref 0.44–1.00)
GFR, Estimated: 40 mL/min — ABNORMAL LOW (ref >60)
Globulin: 2.6 g/dL (calc) (ref 1.9–3.7)
Glucose, Bld: 97 mg/dL (ref 65–99)
Glucose, Bld: 102 mg/dL — ABNORMAL HIGH (ref 70–99)
Potassium: 4.2 mmol/L (ref 3.5–5.3)
Potassium: 3.4 mmol/L — ABNORMAL LOW (ref 3.5–5.1)
Sodium: 140 mmol/L (ref 135–146)
Sodium: 136 mmol/L (ref 135–145)
Total Bilirubin: 0.4 mg/dL (ref 0.2–1.2)
Total Bilirubin: 0.6 mg/dL (ref 0.3–1.2)
Total Protein: 6.3 g/dL (ref 6.1–8.1)
Total Protein: 5.5 g/dL — ABNORMAL LOW (ref 6.5–8.1)

## 2022-02-13 LAB — LACTIC ACID, PLASMA: Lactic Acid, Venous: 1.2 mmol/L (ref 0.5–1.9)

## 2022-02-13 LAB — PREPARE RBC (CROSSMATCH)

## 2022-02-13 LAB — TSH: TSH: 1.835 u[IU]/mL (ref 0.350–4.500)

## 2022-02-13 LAB — SARS CORONAVIRUS 2 BY RT PCR: SARS Coronavirus 2 by RT PCR: POSITIVE — AB

## 2022-02-13 LAB — PROTIME-INR
INR: 1.2 (ref 0.8–1.2)
Prothrombin Time: 15.5 seconds — ABNORMAL HIGH (ref 11.4–15.2)

## 2022-02-13 LAB — IRON: Iron: 21 ug/dL — ABNORMAL LOW (ref 45–160)

## 2022-02-13 LAB — TRANSFERRIN: Transferrin: 146 mg/dL — ABNORMAL LOW (ref 188–341)

## 2022-02-13 LAB — VITAMIN B12: Vitamin B-12: 2173 pg/mL — ABNORMAL HIGH (ref 180–914)

## 2022-02-13 LAB — POC OCCULT BLOOD, ED: Fecal Occult Bld: POSITIVE — AB

## 2022-02-13 MED ORDER — ALBUTEROL SULFATE (2.5 MG/3ML) 0.083% IN NEBU
2.5000 mg | INHALATION_SOLUTION | RESPIRATORY_TRACT | Status: DC | PRN
  Administered 2022-02-20: 2.5 mg via RESPIRATORY_TRACT
  Filled 2022-02-13: qty 3

## 2022-02-13 MED ORDER — PANTOPRAZOLE SODIUM 40 MG IV SOLR: 40.0000 mg | Freq: Two times a day (BID) | INTRAVENOUS | Status: DC

## 2022-02-13 MED ORDER — ACETAMINOPHEN 650 MG RE SUPP: 650.0000 mg | Freq: Four times a day (QID) | RECTAL | Status: DC | PRN

## 2022-02-13 MED ORDER — GUAIFENESIN ER 600 MG PO TB12
600.0000 mg | ORAL_TABLET | Freq: Two times a day (BID) | ORAL | Status: DC
  Filled 2022-02-13 (×2): qty 1

## 2022-02-13 MED ORDER — ONDANSETRON HCL 4 MG/2ML IJ SOLN
4.0000 mg | Freq: Four times a day (QID) | INTRAMUSCULAR | Status: DC | PRN
  Administered 2022-02-14 – 2022-02-27 (×13): 4 mg via INTRAVENOUS
  Filled 2022-02-13 (×13): qty 2

## 2022-02-13 MED ORDER — EZETIMIBE 10 MG PO TABS
10.0000 mg | ORAL_TABLET | Freq: Every day | ORAL | Status: DC
  Administered 2022-02-13 – 2022-02-24 (×11): 10 mg via ORAL
  Filled 2022-02-13 (×11): qty 1

## 2022-02-13 MED ORDER — LEVOTHYROXINE SODIUM 25 MCG PO TABS
25.0000 ug | ORAL_TABLET | Freq: Every day | ORAL | Status: DC
  Administered 2022-02-14 – 2022-02-16 (×2): 25 ug via ORAL
  Filled 2022-02-13 (×2): qty 1

## 2022-02-13 MED ORDER — ROSUVASTATIN CALCIUM 20 MG PO TABS
40.0000 mg | ORAL_TABLET | Freq: Every day | ORAL | Status: DC
  Administered 2022-02-13 – 2022-02-24 (×11): 40 mg via ORAL
  Filled 2022-02-13 (×11): qty 2

## 2022-02-13 MED ORDER — ALBUTEROL SULFATE (2.5 MG/3ML) 0.083% IN NEBU: 2.5000 mg | INHALATION_SOLUTION | Freq: Four times a day (QID) | RESPIRATORY_TRACT | Status: DC | PRN

## 2022-02-13 MED ORDER — DIPHENHYDRAMINE HCL 50 MG/ML IJ SOLN
25.0000 mg | Freq: Once | INTRAMUSCULAR | Status: DC
  Filled 2022-02-13: qty 1

## 2022-02-13 MED ORDER — ALBUTEROL SULFATE HFA 108 (90 BASE) MCG/ACT IN AERS: 2.0000 | INHALATION_SPRAY | Freq: Four times a day (QID) | RESPIRATORY_TRACT | Status: DC | PRN

## 2022-02-13 MED ORDER — PANTOPRAZOLE SODIUM 40 MG IV SOLR
40.0000 mg | Freq: Once | INTRAVENOUS | Status: DC
  Filled 2022-02-13: qty 10

## 2022-02-13 MED ORDER — SODIUM CHLORIDE 0.9% IV SOLUTION: Freq: Once | INTRAVENOUS | Status: DC

## 2022-02-13 MED ORDER — ACETAMINOPHEN 325 MG PO TABS
650.0000 mg | ORAL_TABLET | Freq: Four times a day (QID) | ORAL | Status: DC | PRN
  Administered 2022-02-22 – 2022-02-28 (×2): 650 mg via ORAL
  Filled 2022-02-13 (×4): qty 2

## 2022-02-13 MED ORDER — PANTOPRAZOLE INFUSION (NEW) - SIMPLE MED
8.0000 mg/h | INTRAVENOUS | Status: DC
  Administered 2022-02-13 – 2022-02-15 (×4): 8 mg/h via INTRAVENOUS
  Filled 2022-02-13 (×4): qty 100

## 2022-02-13 MED ORDER — PANTOPRAZOLE SODIUM 40 MG IV SOLR
40.0000 mg | Freq: Once | INTRAVENOUS | Status: AC
  Administered 2022-02-13: 40 mg via INTRAVENOUS
  Filled 2022-02-13: qty 10

## 2022-02-13 MED ORDER — POTASSIUM CHLORIDE 10 MEQ/100ML IV SOLN
10.0000 meq | INTRAVENOUS | Status: AC
  Administered 2022-02-13 (×2): 10 meq via INTRAVENOUS
  Filled 2022-02-13 (×2): qty 100

## 2022-02-13 NOTE — ED Triage Notes (Signed)
Pt arrives via [facility] from home after being referred to ED by PCP due to low Hgb and iron. Pt stating that she tested pos for COVID 9 days ago and has had consistent fatigue. Three days ago she developed melena. Anticoagulated on Eliquis.   Enroute EMS noted pt to have orthostatic changes with SBP dropping to 70 upon standing. Pt given 500 mL NS enroute.

## 2022-02-13 NOTE — Assessment & Plan Note (Signed)
Monitor fluid status as needed patient may need blood transfusions May need to get Lasix if evidence of fluid overload. If blood pressure able to tolerate restart torsemide when able

## 2022-02-13 NOTE — Assessment & Plan Note (Signed)
Allow permissive hypertension 

## 2022-02-13 NOTE — ED Notes (Signed)
This RN updated pt's daughter [name]).

## 2022-02-13 NOTE — H&P (Signed)
[name] NTI:[id number] DOB: Oct 16, 1933 DOA: 02/13/2022     PCP: Ria Bush, MD   Outpatient Specialists:     GI Dr. Bryan Lemma of [facility].    Patient arrived to ER on 02/13/22 at 1456 Referred by Attending Fredia Sorrow, MD   Patient coming from:    home Lives  With family    Chief Complaint:   Chief Complaint  Patient presents with   Melena   Abnormal Lab    HPI: [name] is a 88 y.o. female with medical history significant of a.fib on eliquis, hx of GI bleed, CKD, HTN, anemia    Presented with  melena 7 days ago patient was diagnosed with COVID and her symptoms have started to improve but 3 days she started to have black stools at least 5 stools she went to her primary care provider hemoglobin was 8.9 she has been more lethargic lightheaded and generally weak. Prior history of GI bleed followed by [facility]. History of atrial fibrillation on Eliquis No frank hematemesis  She did also has known history of anemia and had to have blood transfusions in the past as well as iron replacements history of upper GI bleed in the past  Patient have had prior severe reaction to blood. No associated chest pain or shortness of breath Does not smoke nor drink      Initial COVID TEST   POSITIVE,     Lab Results  Component Value Date   [place] NEGATIVE 02/08/2021   [place] Not Detected 10/05/2020   [place] NEGATIVE 03/27/2020   [place] NEGATIVE 02/25/2019     Regarding pertinent Chronic problems:     Hyperlipidemia -  on statins Crestor and Zetia Lipid Panel     Component Value Date/Time   CHOL 101 02/09/2021 0207   CHOL 271 (H) 06/09/2014 1317   TRIG 74 02/09/2021 0207   TRIG 241 (H) 06/09/2014 1317   HDL 28 (L) 02/09/2021 0207   HDL 40 06/09/2014 1317   CHOLHDL 3.6 02/09/2021 0207   VLDL 15 02/09/2021 0207   VLDL 48 (H) 06/09/2014 1317   LDLCALC 58 02/09/2021 0207   LDLCALC 183 (H) 06/09/2014 1317   LDLDIRECT  146.0 11/07/2016 1128     HTN on torsemide   chronic CHF diastolic  - last echoFaeb 2023 There is severely elevated pulmonary artery  systolic pressure. The estimated right  ventricular systolic pressure is 86.7 mmHg.      Hypothyroidism:  Lab Results  Component Value Date   TSH 1.835 02/13/2022   on synthroid       A. Fib -  - CHA2DS2 vas score    9       current  on anticoagulation with  Eliquis,      CKD stage IIIb- baseline Cr 1.3 Estimated Creatinine Clearance: 29.2 mL/min (A) (by C-G formula based on SCr of 1.32 mg/dL (H)).  Lab Results  Component Value Date   CREATININE 1.32 (H) 02/13/2022   CREATININE 1.34 (H) 02/12/2022   CREATININE 1.63 (H) 12/20/2021    Chronic anemia - baseline hg Hemoglobin & Hematocrit  Recent Labs    12/20/21 1200 02/12/22 1655 02/13/22 1625  HGB 11.1* 8.9* 7.8*       While in ER: Clinical Course as of 02/13/22 [place] Feb 13, 2022  1727 Fecal Occult Blood, POC(!): POSITIVE [JS]    Clinical Course User Index [JS] Janeece Fitting, PA-C      CXR - small bilateral  pleural effusions    Following Medications were ordered in ER: Medications  0.9 %  sodium chloride infusion (Manually program via Guardrails IV Fluids) (has no administration in time range)  diphenhydrAMINE (BENADRYL) injection 25 mg (has no administration in time range)  pantoprazole (PROTONIX) injection 40 mg (40 mg Intravenous Given 02/13/22 1806)    _______________________________________________________ ER Provider Called:   [facility]   Dr.Stark  They Recommend admit to medicine   Will see in AM      ED Triage Vitals  Enc Vitals Group     BP 02/13/22 1506 (!) 144/54     Pulse Rate 02/13/22 1506 60     Resp 02/13/22 1506 14     Temp 02/13/22 1506 98.1 F (36.7 C)     Temp src --      SpO2 02/13/22 1506 100 %     Weight 02/13/22 1507 142 lb (64.4 kg)     Height 02/13/22 1507 '5\' 6"'$  (1.676 m)     Head Circumference --      Peak Flow --      Pain Score  02/13/22 1507 0     Pain Loc --      Pain Edu? --      Excl. in [place]? --   TMAX(24)@     _________________________________________ Significant initial  Findings: Abnormal Labs Reviewed  CBC WITH DIFFERENTIAL/PLATELET - Abnormal; Notable for the following components:      Result Value   WBC 3.0 (*)    RBC 2.70 (*)    Hemoglobin 7.8 (*)    HCT 23.8 (*)    RDW 16.6 (*)    Platelets 142 (*)    nRBC 0.7 (*)    Lymphs Abs 0.4 (*)    Abs Immature Granulocytes 0.09 (*)    All other components within normal limits  COMPREHENSIVE METABOLIC PANEL - Abnormal; Notable for the following components:   Potassium 3.4 (*)    Glucose, Bld 102 (*)    BUN 68 (*)    Creatinine, Ser 1.32 (*)    Calcium 8.5 (*)    Total Protein 5.5 (*)    Albumin 3.0 (*)    Alkaline Phosphatase 36 (*)    GFR, Estimated 40 (*)    All other components within normal limits  PROTIME-INR - Abnormal; Notable for the following components:   Prothrombin Time 15.5 (*)    All other components within normal limits  POC OCCULT BLOOD, ED - Abnormal; Notable for the following components:   Fecal Occult Bld POSITIVE (*)    All other components within normal limits      ECG: Ordered Personally reviewed and interpreted by me showing: HR : 60 Rhythm:  Paired ventricular premature complexes Nonspecific IVCD with LAD Anterolateral infarct, old QTC 494     The recent clinical data is shown below. Vitals:   02/13/22 1800 02/13/22 1813 02/13/22 1815 02/13/22 1830  BP: (!) 147/47  (!) 134/47 (!) 130/58  Pulse: 60  (!) 59 63  Resp: 15  (!) 21 19  Temp:  98.7 F (37.1 C)    SpO2: 100%  98% 96%  Weight:      Height:        WBC     Component Value Date/Time   WBC 3.0 (L) 02/13/2022 1625   LYMPHSABS 0.4 (L) 02/13/2022 1625   LYMPHSABS 0.6 (L) 03/22/2019 1415   MONOABS 0.3 02/13/2022 1625   EOSABS 0.0 02/13/2022 1625   EOSABS 0.2 03/22/2019  1415   BASOSABS 0.0 02/13/2022 1625   BASOSABS 0.0 03/22/2019 1415    Lactic  Acid, Venous    Component Value Date/Time   LATICACIDVEN 1.2 02/13/2022 1505     Results for orders placed or performed in visit on 02/04/22  Anaerobic and Aerobic Culture     Status: None   Collection Time: 02/04/22 12:00 AM   Specimen: Abscess   Abscess  Result Value Ref Range Status   Anaerobic Culture Final report  Final   Result 1 Comment  Final    Comment: No anaerobic growth in 72 hours.   Aerobic Culture Final report  Final   Result 1 Comment  Final    Comment: No growth in 36 - 48 hours.   *Note: Due to a large number of results and/or encounters for the requested time period, some results have not been displayed. A complete set of results can be found in Results Review.     _______________________________________________ Hospitalist was called for admission for   Acute upper gastrointestinal bleeding     The following Work up has been ordered so far:  Orders Placed This Encounter  Procedures   Critical Care   SARS Coronavirus 2 by RT PCR (hospital order, performed in [employer] hospital lab) *cepheid single result test* Anterior Nasal Swab   DG Chest 2 View   CBC with Differential   Comprehensive metabolic panel   Protime-INR   Lactic acid, plasma   TSH   Vitamin B12   Folate   Iron and TIBC   Ferritin   Reticulocytes   Diet NPO time specified   Orthostatic Vital signs   Place X2 Large Bore IV's   Initiate Carrier Fluid Protocol   Informed Consent Details: Physician/Practitioner Attestation; Transcribe to consent form and obtain patient signature   Consult to gastroenterology   Consult to family practice   Consult for [place] Admission   Consult to hospitalist   Airborne and Contact precautions   POC occult blood, ED   EKG 12-Lead   Type and screen [place]   Prepare RBC (crossmatch)     OTHER Significant initial  Findings:  labs showing:  Recent Labs  Lab 02/12/22 1655 02/13/22 1625  NA 140 136  K 4.2 3.4*   CO2 23 27  GLUCOSE 97 102*  BUN 85* 68*  CREATININE 1.34* 1.32*  CALCIUM 9.2 8.5*    Cr    stable,    Lab Results  Component Value Date   CREATININE 1.32 (H) 02/13/2022   CREATININE 1.34 (H) 02/12/2022   CREATININE 1.63 (H) 12/20/2021    Recent Labs  Lab 02/12/22 1655 02/13/22 1625  AST 22 21  ALT 11 14  ALKPHOS  --  36*  BILITOT 0.4 0.6  PROT 6.3 5.5*  ALBUMIN  --  3.0*   Lab Results  Component Value Date   CALCIUM 8.5 (L) 02/13/2022   PHOS 3.8 09/18/2017    Plt: Lab Results  Component Value Date   PLT 142 (L) 02/13/2022    COVID-19 Labs  Recent Labs    02/12/22 1657  FERRITIN 768*    Lab Results  Component Value Date   SARSCOV2NAA NEGATIVE 02/08/2021   [place] Not Detected 10/05/2020   SARSCOV2NAA NEGATIVE 03/27/2020   [place] NEGATIVE 02/25/2019       Recent Labs  Lab 02/12/22 1655 02/13/22 1625  WBC 2.6* 3.0*  NEUTROABS 1,862 2.3  HGB 8.9* 7.8*  HCT 26.7* 23.8*  MCV  84.0 88.1  PLT 144 142*    HG/HCT   Down from baseline see below    Component Value Date/Time   HGB 7.8 (L) 02/13/2022 1625   HGB 11.1 (L) 08/28/2021 1259   HGB 8.1 (L) 03/22/2019 1415   HCT 23.8 (L) 02/13/2022 1625   HCT 25.9 (L) 03/22/2019 1415   MCV 88.1 02/13/2022 1625   MCV 88 03/22/2019 1415     BNP (last 3 results) Recent Labs    05/17/21 1411 06/27/21 1013 08/08/21 1049  BNP 252.2* 187.0* 210.7*        Cultures:    Component Value Date/Time   SDES BLOOD RIGHT ARM 02/08/2021 1300   SPECREQUEST  02/08/2021 1300    BOTTLES DRAWN AEROBIC AND ANAEROBIC Blood Culture adequate volume   CULT  02/08/2021 1300    NO GROWTH 5 DAYS Performed at [facility] [address] [place], [place] [address]    REPTSTATUS 02/13/2021 FINAL 02/08/2021 1300     Radiological Exams on Admission: No results found. _______________________________________________________________________________________________________ Latest  Blood pressure (!) 130/58,  pulse 63, temperature 98.7 F (37.1 C), resp. rate 19, height '5\' 6"'$  (1.676 m), weight 64.4 kg, SpO2 96 %.   Vitals  labs and radiology finding personally reviewed  Review of Systems:    Pertinent positives include:   melena, fatigue,  Constitutional:  No weight loss, night sweats, Fevers, chills, weight loss  HEENT:  No headaches, Difficulty swallowing,Tooth/dental problems,Sore throat,  No sneezing, itching, ear ache, nasal congestion, post nasal drip,  Cardio-vascular:  No chest pain, Orthopnea, PND, anasarca, dizziness, palpitations.no Bilateral lower extremity swelling  GI:  No heartburn, indigestion, abdominal pain, nausea, vomiting, diarrhea, change in bowel habits, loss of appetite, blood in stool, hematemesis Resp:  no shortness of breath at rest. No dyspnea on exertion, No excess mucus, no productive cough, No non-productive cough, No coughing up of blood.No change in color of mucus.No wheezing. Skin:  no rash or lesions. No jaundice GU:  no dysuria, change in color of urine, no urgency or frequency. No straining to urinate.  No flank pain.  Musculoskeletal:  No joint pain or no joint swelling. No decreased range of motion. No back pain.  Psych:  No change in mood or affect. No depression or anxiety. No memory loss.  Neuro: no localizing neurological complaints, no tingling, no weakness, no double vision, no gait abnormality, no slurred speech, no confusion  All systems reviewed and apart from [place] all are negative _______________________________________________________________________________________________ Past Medical History:   Past Medical History:  Diagnosis Date   Actinic keratosis 01/17/2015   R forearm   Adjustment disorder with anxiety    Anemia    Anginal pain ([place])    Arthritis    "some in my hands" (11/11/2012)   Atrial fibrillation (HCC)    Atypical mole 03/25/2018   L forearm - severe   Automatic implantable cardioverter-defibrillator in situ     Avascular necrosis of hip ([place]) 05/03/2011   Blood in stool    Carotid artery stenosis 09/2007   a. 09/2007: 60-79% bilateral (stable); b. 10/2008: 40-59% R 60-79%    Chest pain, unspecified    Chronic airway obstruction, not elsewhere classified    Chronic bronchitis ([place])    "get it some; not q year" (11/11/2012)   Chronic diastolic CHF (congestive heart failure) ([place]) 06/04/2013   Chronic kidney disease, unspecified    Coronary artery disease    non-obstructive by 2006 cath   Displaced fracture of  left femoral neck ([place]) 10/09/2018   Frequent UTI    "get them a couple times/yr" (11/11/2012)   GI bleed 03/28/2020   Hemorrhage of rectum and anus    High cholesterol    History of blood transfusion 04/2011   "after hip OR" (11/11/2012)   Hx of renal cell cancer    Hypertension 05/20/2011   Hypertr obst cardiomyop    Hypotension, unspecified    cardiac cath 2006..nonobstructive CAD 30-40s lesions.Marland KitchenETT 1/09 nondiagnostic due to poor HR response..Right Renal Cancer 2003   Long term (current) use of anticoagulants    Malignant neoplasm of kidney, except pelvis    Osteoarthritis of right hip    Other and unspecified coagulation defects    PONV (postoperative nausea and vomiting)    Presence of permanent cardiac pacemaker    RECTAL BLEEDING 10/13/2009   Qualifier: Diagnosis of  By: Chester Holstein NP, Paula     Renal cancer [facility]) 06/2001   Right   Secondary cardiomyopathy, unspecified    Sinus bradycardia    Squamous cell carcinoma of skin 01/17/2015   R lat wrist   Squamous cell carcinoma of skin 01/29/2018   R post upper leg - superficially invasive   Squamous cell carcinoma of skin 03/25/2018   L lat foot   Squamous cell carcinoma of skin 11/09/2020   left lat foot - EDC 01/01/21, recurrent 01/31/21 - MOHs 02/22/21   Squamous cell carcinoma of skin 12/19/2021   Right Posterior Medial Thigh, EDC   Squamous cell carcinoma of skin 12/19/2021   SCC IS, L lat heel, EDC 02/04/2022   Squamous  cell carcinoma of skin 01/21/2022   L forearm, EDC 02/04/2022   Squamous cell carcinoma of skin 01/21/2022   R lower leg below knee, EDC 02/04/2022   Squamous cell carcinoma of skin 01/21/2022   SCCIS, R post heel, EDC 02/04/2022   Urge incontinence       Past Surgical History:  Procedure Laterality Date   ABDOMINAL AORTOGRAM W/LOWER EXTREMITY N/A 02/12/2021   Procedure: ABDOMINAL AORTOGRAM W/LOWER EXTREMITY;  Surgeon: Waynetta Sandy, MD;  Location: [place] [facility];  Service: Cardiovascular;  Laterality: N/A;   ABDOMINAL HYSTERECTOMY  1975   for benign causes   APPENDECTOMY     BI-VENTRICULAR PACEMAKER UPGRADE  05/04/2010   BIOPSY  02/28/2019   Procedure: BIOPSY;  Surgeon: Milus Banister, MD;  Location: [facility] ENDOSCOPY;  Service: Endoscopy;;   CARDIAC CATHETERIZATION  2006   CARDIOVERSION N/A 02/20/2018   Procedure: CARDIOVERSION;  Surgeon: Minna Merritts, MD;  Location: [facility];  Service: Cardiovascular;  Laterality: N/A;   CARDIOVERSION N/A 03/27/2018   Procedure: CARDIOVERSION;  Surgeon: Minna Merritts, MD;  Location: [facility];  Service: Cardiovascular;  Laterality: N/A;   CATARACT EXTRACTION W/ INTRAOCULAR LENS  IMPLANT, BILATERAL  01/2006-02-2006   CHOLECYSTECTOMY N/A 11/11/2012   Procedure: LAPAROSCOPIC CHOLECYSTECTOMY WITH INTRAOPERATIVE CHOLANGIOGRAM;  Surgeon: Imogene Burn. Georgette Dover, MD;  Location: [place];  Service: General;  Laterality: N/A;   COLONOSCOPY WITH PROPOFOL N/A 02/28/2019   Procedure: COLONOSCOPY WITH PROPOFOL;  Surgeon: Milus Banister, MD;  Location: [facility] ENDOSCOPY;  Service: Endoscopy;  Laterality: N/A;   ENTEROSCOPY N/A 03/30/2020   Procedure: ENTEROSCOPY;  Surgeon: Lavena Bullion, DO;  Location: [place];  Service: Gastroenterology;  Laterality: N/A;   ENTEROSCOPY N/A 02/10/2021   Procedure: ENTEROSCOPY;  Surgeon: Carol Ada, MD;  Location: [place];  Service: Endoscopy;  Laterality: N/A;   EP IMPLANTABLE DEVICE N/A 02/21/2016  Procedure: ICD Generator Changeout;  Surgeon: Deboraha Sprang, MD;  Location: [place] [facility];  Service: Cardiovascular;  Laterality: N/A;   ESOPHAGOGASTRODUODENOSCOPY (EGD) WITH PROPOFOL N/A 02/28/2019   Procedure: ESOPHAGOGASTRODUODENOSCOPY (EGD) WITH PROPOFOL;  Surgeon: Milus Banister, MD;  Location[facility] ENDOSCOPY;  Service: Endoscopy;  Laterality: N/A;   GIVENS CAPSULE STUDY N/A 03/15/2019   Procedure: GIVENS CAPSULE STUDY;  Surgeon: Thornton Park, MD;  Location: [place];  Service: Gastroenterology;  Laterality: N/A;   HOT HEMOSTASIS N/A 03/30/2020   Procedure: HOT HEMOSTASIS (ARGON PLASMA COAGULATION/BICAP);  Surgeon: Lavena Bullion, DO;  Location: [facility] ENDOSCOPY;  Service: Gastroenterology;  Laterality: N/A;   HOT HEMOSTASIS N/A 02/10/2021   Procedure: HOT HEMOSTASIS (ARGON PLASMA COAGULATION/BICAP);  Surgeon: Carol Ada, MD;  Location: [place];  Service: Endoscopy;  Laterality: N/A;   INSERT / REPLACE / REMOVE PACEMAKER  05-01-11   02-28-05-/05-04-10-ICD-MEDTRONIC MAXIMAL DR   JOINT REPLACEMENT     LAPAROSCOPIC CHOLECYSTECTOMY  11/11/2012   LAPAROSCOPIC LYSIS OF ADHESIONS N/A 11/11/2012   Procedure: LAPAROSCOPIC LYSIS OF ADHESIONS;  Surgeon: Imogene Burn. Georgette Dover, MD;  Location: [place];  Service: General;  Laterality: N/A;   NEPHRECTOMY Right 06/2001    S/P RENAL CELL CANCER   PERIPHERAL VASCULAR INTERVENTION Bilateral 02/12/2021   Procedure: PERIPHERAL VASCULAR INTERVENTION;  Surgeon: Waynetta Sandy, MD;  Location: [place] [facility];  Service: Cardiovascular;  Laterality: Bilateral;  Iliac artery stents   PRESSURE SENSOR/CARDIOMEMS N/A 02/03/2019   Procedure: PRESSURE SENSOR/CARDIOMEMS;  Surgeon: Larey Dresser, MD;  Location: [place] [facility];  Service: Cardiovascular;  Laterality: N/A;   RIGHT HEART CATH N/A 11/09/2018   Procedure: RIGHT HEART CATH;  Surgeon: Larey Dresser, MD;  Location: [place] [facility];  Service: Cardiovascular;  Laterality: N/A;   RIGHT  HEART CATH N/A 03/08/2019   Procedure: RIGHT HEART CATH;  Surgeon: Larey Dresser, MD;  Location: [place] [facility];  Service: Cardiovascular;  Laterality: N/A;   SUBMUCOSAL TATTOO INJECTION  02/28/2019   Procedure: SUBMUCOSAL TATTOO INJECTION;  Surgeon: Milus Banister, MD;  Location: [facility] ENDOSCOPY;  Service: Endoscopy;;   TOTAL HIP ARTHROPLASTY Right 05/03/2011   Procedure: TOTAL HIP ARTHROPLASTY ANTERIOR APPROACH;  Surgeon: Mcarthur Rossetti;  Location: [facility];  Service: Orthopedics;  Laterality: Right;  Removal of Cannulated Screws Right Hip, Right Direct Anterior Hip Replacement   TOTAL HIP ARTHROPLASTY Left 10/09/2018   Procedure: TOTAL HIP ARTHROPLASTY ANTERIOR APPROACH;  Surgeon: Rod Can, MD;  Location: [place];  Service: Orthopedics;  Laterality: Left;    Social History:  Ambulatory   independently       reports that she quit smoking about 20 years ago. Her smoking use included cigarettes. She has a 20.00 pack-year smoking history. She has never used smokeless tobacco. She reports that she does not drink alcohol and does not use drugs.     Family History:   Family History  Problem Relation Age of Onset   Heart failure Mother    Early death Father        car accident   Breast cancer Maternal Aunt 82   Breast cancer Cousin    Breast cancer Other    Colon cancer Neg Hx    Esophageal cancer Neg Hx    Liver disease Neg Hx    Pancreatic cancer Neg Hx    Rectal cancer Neg Hx    ______________________________________________________________________________________________ Allergies: Allergies  Allergen Reactions   Dilaudid [Hydromorphone] Other (See Comments)    Excessive  Somnolence- Required Narcan   Fentanyl Other (See Comments)    Required Narcan when given with Dilaudid. Tolerated single dose when given during procedure 02/03/2019.    Codeine Nausea And Vomiting    Hallucinations    Morphine And Related Nausea And Vomiting    Hallucinations    Whole Blood  Itching and Nausea And Vomiting    Pt and family stated pt will receive blood products but had a reaction requiring benadryl on a previous administration.    Sulfonamide Derivatives Other (See Comments)    Dry mouth     Prior to Admission medications   Medication Sig Start Date End Date Taking? Authorizing Provider  acetaminophen (TYLENOL) 500 MG tablet Take 500-1,000 mg by mouth every 8 (eight) hours as needed for mild pain or headache.    [provider]  albuterol (VENTOLIN HFA) 108 (90 Base) MCG/ACT inhaler Inhale 2 puffs into the lungs every 6 (six) hours as needed for wheezing or shortness of breath. 02/08/22   Waunita Schooner, MD  Cholecalciferol (VITAMIN D) 50 MCG (2000 UT) CAPS Take 4,000 Units by mouth daily.    [provider]  clobetasol cream (TEMOVATE) 0.05 % Apply to affected area left foot twice a day until improved. Avoid face, groin, axilla. 12/19/21   Brendolyn Patty, MD  Cranberry (AZO CRANBERRY GUMMIES) 500 MG CHEW Chew 2,000 mg by mouth daily.    [provider]  Cyanocobalamin (B-12 PO) Take 1 tablet by mouth daily.     [provider]  doxycycline (ADOXA) 100 MG tablet Take 1 tablet (100 mg total) by mouth 2 (two) times daily for 10 days. Take with food Patient not taking: Reported on 02/12/2022 02/04/22 02/14/22  Brendolyn Patty, MD  ELIQUIS 2.5 MG TABS tablet TAKE 1 TABLET BY MOUTH TWICE A DAY 06/11/21   Larey Dresser, MD  ezetimibe (ZETIA) 10 MG tablet Take 1 tablet (10 mg total) by mouth daily. 06/21/20   [place], Maricela Bo, FNP  ferrous sulfate 325 (65 FE) MG tablet Take 1 tablet (325 mg total) by mouth 2 (two) times daily with a meal. 03/17/19   Shelly Coss, MD  fluticasone (FLONASE) 50 MCG/ACT nasal spray Place 2 sprays into both nostrils daily as needed for allergies or rhinitis. 03/08/21   Waunita Schooner, MD  levothyroxine (SYNTHROID) 25 MCG tablet TAKE 1 TABLET BY MOUTH ONCE DAILY BEFOREBREAKFAST 04/19/19   Deboraha Sprang, MD   magnesium oxide (MAG-OX) 400 MG tablet Take 1 tablet (400 mg total) by mouth daily. 06/05/18   Deboraha Sprang, MD  metolazone (ZAROXOLYN) 2.5 MG tablet Take 1 tablet (2.5 mg total) by mouth as directed. Take every other Thursday with your morning dose of Torsemide Patient taking differently: Take 2.5 mg by mouth once a week. Take Thursday with your morning dose of Torsemide 08/08/21   [place], Maricela Bo, FNP  mupirocin ointment (BACTROBAN) 2 % Apply 1 Application topically daily. 02/04/22   Brendolyn Patty, MD  octreotide (SANDOSTATIN LAR) 10 MG injection Inject 10 mg into the muscle every 28 (twenty-eight) days. 12/26/21   Cirigliano, Vito V, DO  ondansetron (ZOFRAN) 4 MG tablet Take 1 tablet (4 mg total) by mouth every 8 (eight) hours as needed for nausea or vomiting. 04/17/20   Elby Beck, FNP  pantoprazole (PROTONIX) 40 MG tablet Take 1 tablet (40 mg total) by mouth 2 (two) times daily before a meal. 02/15/21 02/15/22  Thurnell Lose, MD  potassium chloride SA (KLOR-CON M) 20 MEQ  tablet TAKE 2 TABLETS (40 MEQ) BY MOUTH 2 TIMESDAILY. TAKE 1 EXTRA TABLET ON THE DAYS YOU TAKE METOLAZONE. 10/04/21   Larey Dresser, MD  predniSONE (DELTASONE) 20 MG tablet Take 2 tablets (40 mg total) by mouth daily with breakfast for 5 days. 02/08/22 02/13/22  Waunita Schooner, MD  rosuvastatin (CRESTOR) 40 MG tablet Take 1 tablet (40 mg total) by mouth daily. 11/11/18   Larey Dresser, MD  torsemide (DEMADEX) 100 MG tablet TAKE 1 TABLET BY MOUTH EVERY MORNING (EVENING DOSE 80 MG) 01/03/22   Larey Dresser, MD  vitamin C (ASCORBIC ACID) 250 MG tablet Take 500 mg by mouth daily.    [provider]    ___________________________________________________________________________________________________ Physical Exam:    02/13/2022    6:30 PM 02/13/2022    6:15 PM 02/13/2022    6:00 PM  Vitals with BMI  Systolic [phone]  Diastolic 58 47 47  Pulse 63 59 60     1. General:  in No  Acute distress    Chronically ill   -appearing 2. Psychological: Alert and   Oriented 3. Head/ENT:    Dry Mucous Membranes                          Head Non traumatic, neck supple                     Poor Dentition 4. SKIN:  decreased Skin turgor,  Skin clean Dry and intact no rash 5. Heart: Regular rate and rhythm no  Murmur, no Rub or gallop Pacemaker in place 6. Lungs:   no wheezes or crackles   7. Abdomen: Soft,  non-tender, Non distended bowel sounds present 8. Lower extremities: no clubbing, cyanosis, no  edema 9. Neurologically Grossly intact, moving all 4 extremities equally   10. MSK: Normal range of motion    Chart has been reviewed  ______________________________________________________________________________________________  Assessment/Plan  88 y.o. female with medical history significant of a.fib on eliquis, hx of GI bleed, CKD, HTN, anemia  Admitted for   Acute upper gastrointestinal bleeding    Present on Admission:  Hypothyroidism  Mixed hyperlipidemia  Permanent atrial fibrillation  COPD (chronic obstructive pulmonary disease) (HCC)  Chronic kidney disease, stage 3b (HCC)  Chronic diastolic CHF (congestive heart failure) ([place])  Hypertension  ICD (implantable cardioverter-defibrillator) in place  COVID-19 virus infection  Upper GI bleed  Hypokalemia     Hypothyroidism Continue Synthroid 25 mcg a day  Mixed hyperlipidemia Continue Zetia 10 mg p.o. a day  Permanent atrial fibrillation Hold Eliquis given GI bleed rate controlled at this time continue to monitor  COPD (chronic obstructive pulmonary disease) (HCC) Chronic stable continue home medications  Chronic kidney disease, stage 3b (HCC)  -chronic avoid nephrotoxic medications such as NSAIDs, Vanco Zosyn combo,  avoid hypotension, continue to follow renal function   Chronic diastolic CHF (congestive heart failure) (HCC) Monitor fluid status as needed patient may need blood transfusions May need to get Lasix  if evidence of fluid overload. If blood pressure able to tolerate restart torsemide when able  Hypertension Allow permissive hypertension  ICD (implantable cardioverter-defibrillator) in place Chronic stable  COVID-19 virus infection COVID infection 9 days ago.  Currently no evidence of active COVID infection continue to monitor  Upper GI bleed     -  ER  Provider spoke to gastroenterology (  [facility]) they will see patient in a.m. appreciate their consult   -  serial CBC.    - Monitor for any recurrence,  evidence of hemodynamic instability or significant blood loss  - Transfuse given drop in hg follow serial CBC  - Establish at least 2 PIV and fluid resuscitate   - clear liquids for tonight keep nothing by mouth post midnight,   -  Administer  drip         Hypokalemia - will replace and repeat in AM,  check magnesium level and replace as needed   Symptomatic anemia Pt is stating would like to hold off on transfusion she is very concerned regarding reaction  will repeat CBC if stable will hold off but if continues to drop will need to transfuse    Other plan as per orders.  DVT prophylaxis:  SCD     Code Status:    Code Status: Prior FULL CODE   as per patient   I had personally discussed CODE STATUS with patient     Family Communication:   Family not at  Bedside    Disposition Plan:     To home once workup is complete and patient is stable   Following barriers for discharge:                            Electrolytes corrected                               Anemia corrected                               Would benefit from PT/OT eval prior to DC  Ordered                     Consults called:  [facility]  Admission status:  ED Disposition     ED Disposition  [place]: [place] [[id number]]  Level of Care: Telemetry Medical [[id number]]  May admit patient to [facility] or [facility] if equivalent level of care is  available:: No  Covid Evaluation: Recent COVID positive no isolation required infection day 21-90  Diagnosis: Upper GI bleed [846962]  Admitting Physician: Toy Baker [[id number]]  Attending Physician: Toy Baker [[id number]]  Certification:: I certify this patient will need inpatient services for at least 2 midnights  Estimated Length of Stay: 2          inpatient     I Expect 2 midnight stay secondary to severity of patient's current illness need for inpatient interventions justified by the following:      Severe lab/radiological/exam abnormalities including:    Upper gi bleed and extensive comorbidities including:  CHF   COPD/asthma  CKD   Chronic anticoagulation  That are currently affecting medical management.   I expect  patient to be hospitalized for 2 midnights requiring inpatient medical care.  Patient is at high risk for adverse outcome (such as loss of life or disability) if not treated.  Indication for inpatient stay as follows:    Need for operative/procedural  intervention    Need for   IV fluids, IV PPI     Level of care     tele  For  24H    Lab Results  Component Value Date   [facility] [place] (A) 02/13/2022  Precautions: admitted as recent covid     Yanci Bachtell 02/13/2022, 7:22 PM    [facility]     after 2 AM please page floor coverage PA If 7AM-7PM, please contact the day team taking care of the patient using [email or web address]   Patient was evaluated in the context of the global COVID-19 pandemic, which necessitated consideration that the patient might be at risk for infection with the SARS-CoV-2 virus that causes COVID-19. Institutional protocols and algorithms that pertain to the evaluation of patients at risk for COVID-19 are in a state of rapid change based on information released by regulatory bodies including the CDC and federal and state organizations. These policies and algorithms were followed during the patient's  care.

## 2022-02-13 NOTE — ED Provider Notes (Addendum)
[facility] EMERGENCY DEPARTMENT Provider Note   CSN: [id number] Arrival date & time: 02/13/22  1456     History Afib, HTN, Upper GI bleed Chief Complaint  Patient presents with   Melena   Abnormal Lab    [name] is a 88 y.o. female.  88 y.o female with a PMH of CKD, HTN, Afib on eliquis presents to the ED with a chief complaint of abnormal lab. Patient is currently on day 9 status post COVID infection, she was having increasing weakness, felt that she was getting very fatigued.  She saw her PCP yesterday who had blood ordered and drawn, reports she was told her hemoglobin was low around 8.  She does have a prior history of anemia, had to have iron replacement in the past along with prior cauterizations for upper GI bleeds.  He was previously followed by Dr. Bryan Lemma of [place]. She endorses 3 days of coffee-ground stools with 5-6 episodes daily.  Patient also endorses some abdominal pain with every bowel movement.  She is on long term anticoagulation such as Eliquis for A-fib.  She also endorses some nausea but has not had any vomiting.  She was previously transfused but had a severe reaction to blood.  Denies any chest pain, dizziness, vomiting. No hematemesis.   The history is provided by the patient and medical records.  Abnormal Lab Time since result:  8 Patient referred by:  PCP Resulting agency:  External Result type: hematology        Home Medications Prior to Admission medications   Medication Sig Start Date End Date Taking? Authorizing Provider  acetaminophen (TYLENOL) 500 MG tablet Take 500-1,000 mg by mouth every 8 (eight) hours as needed for mild pain or headache.    [provider]  albuterol (VENTOLIN HFA) 108 (90 Base) MCG/ACT inhaler Inhale 2 puffs into the lungs every 6 (six) hours as needed for wheezing or shortness of breath. 02/08/22   Waunita Schooner, MD  Cholecalciferol (VITAMIN D) 50 MCG (2000 UT) CAPS Take 4,000 Units  by mouth daily.    [provider]  clobetasol cream (TEMOVATE) 0.05 % Apply to affected area left foot twice a day until improved. Avoid face, groin, axilla. 12/19/21   Brendolyn Patty, MD  Cranberry (AZO CRANBERRY GUMMIES) 500 MG CHEW Chew 2,000 mg by mouth daily.    [provider]  Cyanocobalamin (B-12 PO) Take 1 tablet by mouth daily.     [provider]  doxycycline (ADOXA) 100 MG tablet Take 1 tablet (100 mg total) by mouth 2 (two) times daily for 10 days. Take with food Patient not taking: Reported on 02/12/2022 02/04/22 02/14/22  Brendolyn Patty, MD  ELIQUIS 2.5 MG TABS tablet TAKE 1 TABLET BY MOUTH TWICE A DAY 06/11/21   Larey Dresser, MD  ezetimibe (ZETIA) 10 MG tablet Take 1 tablet (10 mg total) by mouth daily. 06/21/20   [place], Maricela Bo, FNP  ferrous sulfate 325 (65 FE) MG tablet Take 1 tablet (325 mg total) by mouth 2 (two) times daily with a meal. 03/17/19   Shelly Coss, MD  fluticasone (FLONASE) 50 MCG/ACT nasal spray Place 2 sprays into both nostrils daily as needed for allergies or rhinitis. 03/08/21   Waunita Schooner, MD  levothyroxine (SYNTHROID) 25 MCG tablet TAKE 1 TABLET BY MOUTH ONCE DAILY BEFOREBREAKFAST 04/19/19   Deboraha Sprang, MD  magnesium oxide (MAG-OX) 400 MG tablet Take 1 tablet (400 mg total) by mouth daily. 06/05/18  Deboraha Sprang, MD  metolazone (ZAROXOLYN) 2.5 MG tablet Take 1 tablet (2.5 mg total) by mouth as directed. Take every other Thursday with your morning dose of Torsemide Patient taking differently: Take 2.5 mg by mouth once a week. Take Thursday with your morning dose of Torsemide 08/08/21   [place], Maricela Bo, FNP  mupirocin ointment (BACTROBAN) 2 % Apply 1 Application topically daily. 02/04/22   Brendolyn Patty, MD  octreotide (SANDOSTATIN LAR) 10 MG injection Inject 10 mg into the muscle every 28 (twenty-eight) days. 12/26/21   Cirigliano, Vito V, DO  ondansetron (ZOFRAN) 4 MG tablet Take 1 tablet (4 mg total) by mouth every 8  (eight) hours as needed for nausea or vomiting. 04/17/20   Elby Beck, FNP  pantoprazole (PROTONIX) 40 MG tablet Take 1 tablet (40 mg total) by mouth 2 (two) times daily before a meal. 02/15/21 02/15/22  Thurnell Lose, MD  potassium chloride SA (KLOR-CON M) 20 MEQ tablet TAKE 2 TABLETS (40 MEQ) BY MOUTH 2 TIMESDAILY. TAKE 1 EXTRA TABLET ON THE DAYS YOU TAKE METOLAZONE. 10/04/21   Larey Dresser, MD  predniSONE (DELTASONE) 20 MG tablet Take 2 tablets (40 mg total) by mouth daily with breakfast for 5 days. 02/08/22 02/13/22  Waunita Schooner, MD  rosuvastatin (CRESTOR) 40 MG tablet Take 1 tablet (40 mg total) by mouth daily. 11/11/18   Larey Dresser, MD  torsemide (DEMADEX) 100 MG tablet TAKE 1 TABLET BY MOUTH EVERY MORNING (EVENING DOSE 80 MG) 01/03/22   Larey Dresser, MD  vitamin C (ASCORBIC ACID) 250 MG tablet Take 500 mg by mouth daily.    [provider]      Allergies    Dilaudid [hydromorphone], Fentanyl, Codeine, Morphine and related, Whole blood, and Sulfonamide derivatives    Review of Systems   Review of Systems  Constitutional:  Negative for chills and fever.  HENT:  Negative for sore throat.   Respiratory:  Negative for shortness of breath.   Cardiovascular:  Negative for chest pain.  Gastrointestinal:  Positive for blood in stool, diarrhea and nausea. Negative for abdominal pain, constipation and vomiting.  Genitourinary:  Negative for flank pain.  Musculoskeletal:  Negative for back pain.  Skin:  Negative for pallor and wound.  Neurological:  Positive for weakness. Negative for dizziness, light-headedness and numbness.  All other systems reviewed and are negative.   Physical Exam Updated Vital Signs BP (!) 147/47   Pulse 60   Temp 98.7 F (37.1 C)   Resp 15   Ht '5\' 6"'$  (1.676 m)   Wt 64.4 kg   SpO2 100%   BMI 22.92 kg/m  Physical Exam Vitals and nursing note reviewed. Exam conducted with a chaperone present.  Constitutional:      Appearance:  She is ill-appearing.  HENT:     Head: Normocephalic and atraumatic.     Nose: Nose normal.     Mouth/Throat:     Mouth: Mucous membranes are dry.  Eyes:     Pupils: Pupils are equal, round, and reactive to light.  Cardiovascular:     Rate and Rhythm: Normal rate.  Pulmonary:     Effort: Pulmonary effort is normal.     Breath sounds: No wheezing.  Abdominal:     General: Abdomen is flat.     Palpations: Abdomen is soft.     Tenderness: There is no abdominal tenderness. There is no right CVA tenderness or left CVA tenderness.  Genitourinary:    Rectum:  Guaiac result positive. Internal hemorrhoid present. No tenderness.     Comments: Hemoccult is grossly positive.  Musculoskeletal:     Cervical back: Normal range of motion and neck supple.  Skin:    General: Skin is warm and dry.  Neurological:     Mental Status: She is alert and oriented to person, place, and time.     ED Results / Procedures / Treatments   Labs (all labs ordered are listed, but only abnormal results are displayed) Labs Reviewed  CBC WITH DIFFERENTIAL/PLATELET - Abnormal; Notable for the following components:      Result Value   WBC 3.0 (*)    RBC 2.70 (*)    Hemoglobin 7.8 (*)    HCT 23.8 (*)    RDW 16.6 (*)    Platelets 142 (*)    nRBC 0.7 (*)    Lymphs Abs 0.4 (*)    Abs Immature Granulocytes 0.09 (*)    All other components within normal limits  COMPREHENSIVE METABOLIC PANEL - Abnormal; Notable for the following components:   Potassium 3.4 (*)    Glucose, Bld 102 (*)    BUN 68 (*)    Creatinine, Ser 1.32 (*)    Calcium 8.5 (*)    Total Protein 5.5 (*)    Albumin 3.0 (*)    Alkaline Phosphatase 36 (*)    GFR, Estimated 40 (*)    All other components within normal limits  PROTIME-INR - Abnormal; Notable for the following components:   Prothrombin Time 15.5 (*)    All other components within normal limits  POC OCCULT BLOOD, ED - Abnormal; Notable for the following components:   Fecal Occult  Bld POSITIVE (*)    All other components within normal limits  SARS CORONAVIRUS 2 BY RT PCR  LACTIC ACID, PLASMA  TSH  VITAMIN B12  FOLATE  IRON AND TIBC  FERRITIN  RETICULOCYTES  TYPE AND SCREEN  PREPARE RBC (CROSSMATCH)    EKG EKG Interpretation  Date/Time:  Wednesday February 13 2022 15:05:35 EDT Ventricular Rate:  60 PR Interval:    QRS Duration: 148 QT Interval:  494 QTC Calculation: 494 R Axis:   268 Text Interpretation: Paired ventricular premature complexes Nonspecific IVCD with LAD Anterolateral infarct, old VENTRICULAR PACED RHYTHM Confirmed by Fredia Sorrow [phone]) on 02/13/2022 4:09:48 PM  Radiology No results found.  Procedures .Critical Care  Performed by: Janeece Fitting, PA-C Authorized by: Janeece Fitting, PA-C   Critical care provider statement:    Critical care time (minutes):  45   Critical care start time:  02/13/2022 5:00 PM   Critical care end time:  02/13/2022 5:45 PM   Critical care was necessary to treat or prevent imminent or life-threatening deterioration of the following conditions:  Circulatory failure   Critical care was time spent personally by me on the following activities:  Examination of patient   Care discussed with: admitting provider       Medications Ordered in ED Medications  0.9 %  sodium chloride infusion (Manually program via Guardrails IV Fluids) (has no administration in time range)  diphenhydrAMINE (BENADRYL) injection 25 mg (has no administration in time range)  pantoprazole (PROTONIX) injection 40 mg (40 mg Intravenous Given 02/13/22 1806)    ED Course/ Medical Decision Making/ A&P Clinical Course as of 02/13/22 1845  Wed Feb 13, 2022  1727 Fecal Occult Blood, POC(!): POSITIVE [JS]    Clinical Course User Index [JS] Janeece Fitting, PA-C  Medical Decision Making Amount and/or Complexity of Data Reviewed Labs: ordered. Decision-making details documented in ED Course. Radiology:  ordered.  Risk Prescription drug management.   This patient presents to the ED for concern of abnormal lab, this involves a number of treatment options, and is a complaint that carries with it a high risk of complications and morbidity.  The differential diagnosis includes upper GI bleed.   Co morbidities: Discussed in HPI   Brief History:  Patient here status post COVID-19 infection from 9 days ago.  Here with melena for the past 3 to 4 days with 5-6 episodes daily.  Feels overall fatigued, weak.  Evaluated at PCPs office yesterday noted to have low hemoglobin, previously transfused but developed a reaction to blood.  Worsening some abdominal pain with bowel movements and some nausea but no vomiting.  EMR reviewed including pt PMHx, past surgical history and past visits to ER.   See HPI for more details   Lab Tests:  I ordered and independently interpreted labs.  The pertinent results include:    Labs notable for CBC with leukopenia, hemoglobin is decreased at 7.8 from yesterday's at 8.  She is on anticoagulant Eliquis for A-fib.  CMP with slight decrease in potassium, creatinine is slightly elevated but she does have a history of kidney disease, LFTs are within normal limits.  PT and INR slightly elevated at 15.5.  Hemoccult was grossly positive on my exam and positive today.   Imaging Studies:  No imaging was ordered for this patient at this time.  Cardiac Monitoring:  The patient was maintained on a cardiac monitor.  I personally viewed and interpreted the cardiac monitored which showed an underlying rhythm of: Pvcs EKG non-ischemic   Medicines ordered:  I ordered medication including protonix  for symptomatic treatment.  Reevaluation of the patient after these medicines showed that the patient stayed the same I have reviewed the patients home medicines and have made adjustments as needed   Critical Interventions:  Patient will need blood transfusion, lab has been  contacted and they are prepping blood for antibodies.  Consults:  I requested consultation with Dr. Fuller Plan of gastroenterology,  and discussed lab and imaging findings as well as pertinent plan - they well evaluated patient in consultation tomorrow.  Patient will need to have Eliquis hold off at this time.  Reevaluation:  After the interventions noted above I re-evaluated patient and found that they have :stayed the same   Social Determinants of Health:  The patient's social determinants of health were a factor in the care of this patient    Problem List / ED Course:  Patient is here post COVID on day 9 with generalized weakness and experiencing melena over the past 3 to 4 days.  Having about 5-6 episodes of dark coffee ground bowel movements.  She is currently anticoagulated on Eliquis for her A-fib.  Does have a longstanding history of chronic anemia, previously transfused with a reaction to the antibodies.  You are seen no abdominal pain ascites when she has a bowel movement.  Arrived to the ED hemodynamically stable with pressures in the 120s, no tachycardia.  Labs repeated today show a hemoglobin that is trending down at 7.8 from her 8.9 yesterday.  Her abdomen is soft nontender to palpation.  CMP with no electrolyte derangement aside from some slight hypokalemia.  She does have a history of kidney disease and creatinine today is at her baseline of 1.3, LFTs are unremarkable.  She has not  had any vomiting.  Hemoccult is grossly positive on my exam.  I discussed care with Dr. Fuller Plan of [facility] gastroenterology as this patient is established patient of [facility], she will be evaluated by them in the morning.  Eliquis is to be held off at this time. Tempted to order transfusion for patient, however patient does have a prior allergy listed, lab has been prepping blood but blood has not been ordered.  I did order a Protonix to help with upper GI bleed treatment. I spoke to Dr. Roel Cluck  hospitalist, appreciate her assistance who agreed on admitting patient for further management at this time.  Patient remains hemodynamically stable with stable vital signs.  I discussed planning care with daughter at the bedside who is agreeable of transfusion at this time as well along with patient.  Blood administration has been ordered.   Dispostion:  After consideration of the diagnostic results and the patients response to treatment, I feel that the patent would benefit from admission for further evaluation of upper GI bleed.     Portions of this note were generated with Lobbyist. Dictation errors may occur despite best attempts at proofreading.   Final Clinical Impression(s) / ED Diagnoses Final diagnoses:  Acute upper gastrointestinal bleeding    Rx / DC Orders ED Discharge Orders     None         Janeece Fitting, PA-C 02/13/22 [place], Azarria Balint, PA-C 02/13/22 1845    Fredia Sorrow, MD 02/13/22 2032

## 2022-02-13 NOTE — ED Notes (Signed)
Earney Hamburg (Daughter) of [facility] called asking for an update. Her number is [phone].

## 2022-02-13 NOTE — Assessment & Plan Note (Signed)
COVID infection 9 days ago.  Currently no evidence of active COVID infection continue to monitor

## 2022-02-13 NOTE — Assessment & Plan Note (Signed)
Chronic-stable.

## 2022-02-13 NOTE — Assessment & Plan Note (Signed)
-  chronic avoid nephrotoxic medications such as NSAIDs, Vanco Zosyn combo,  avoid hypotension, continue to follow renal function  

## 2022-02-13 NOTE — ED Notes (Signed)
MD Doutova notified about pt's request to hold off on blood transfusion and to watch hemoglobin levels for now.

## 2022-02-13 NOTE — Telephone Encounter (Signed)
Pt's daughter called stating that pt is on her 9th day of covid and still has symptoms although Covid symptoms seem to be improving. Daughter states that pt started having extremely black stools and PCP let her know that pt's hemoglobin has dropped to 8.9. Pt's daughter also reports that pt has been lethargic, dizzy, and weak. Let pt's daughter know that pt would need to go to ER. Pt's daughter verbalized understanding and had no other concerns at end of call.

## 2022-02-13 NOTE — Subjective & Objective (Signed)
7 days ago patient was diagnosed with COVID and her symptoms have started to improve but 3 days she started to have black stools at least 5 stools she went to her primary care provider hemoglobin was 8.9 she has been more lethargic lightheaded and generally weak. Prior history of GI bleed followed by [facility]. History of atrial fibrillation on Eliquis No frank hematemesis  She did also has known history of anemia and had to have blood transfusions in the past as well as iron replacements history of upper GI bleed in the past  Patient have had prior severe reaction to blood. No associated chest pain or shortness of breath

## 2022-02-13 NOTE — Assessment & Plan Note (Signed)
  -    ER  Provider spoke to gastroenterology (  [facility]) they will see patient in a.m. appreciate their consult   - serial CBC.    - Monitor for any recurrence,  evidence of hemodynamic instability or significant blood loss  - Transfuse given drop in hg follow serial CBC  - Establish at least 2 PIV and fluid resuscitate   - clear liquids for tonight keep nothing by mouth post midnight,   -  Administer  drip

## 2022-02-13 NOTE — Assessment & Plan Note (Signed)
Hold Eliquis given GI bleed rate controlled at this time continue to monitor

## 2022-02-13 NOTE — ED Notes (Signed)
MD Doutova at bedside  

## 2022-02-13 NOTE — Assessment & Plan Note (Signed)
Pt is stating would like to hold off on transfusion she is very concerned regarding reaction  will repeat CBC if stable will hold off but if continues to drop will need to transfuse

## 2022-02-13 NOTE — Assessment & Plan Note (Signed)
Continue Synthroid 25 mcg a day

## 2022-02-13 NOTE — Assessment & Plan Note (Signed)
Chronic stable continue home medications ?

## 2022-02-13 NOTE — Assessment & Plan Note (Signed)
-   will replace and repeat in AM,  check magnesium level and replace as needed ° °

## 2022-02-13 NOTE — Assessment & Plan Note (Signed)
Continue Zetia 10 mg p.o. a day

## 2022-02-14 ENCOUNTER — Other Ambulatory Visit: Payer: Self-pay

## 2022-02-14 ENCOUNTER — Encounter (HOSPITAL_COMMUNITY): Payer: Self-pay | Admitting: Internal Medicine

## 2022-02-14 ENCOUNTER — Inpatient Hospital Stay (HOSPITAL_COMMUNITY): Payer: PPO

## 2022-02-14 DIAGNOSIS — U071 COVID-19: Secondary | ICD-10-CM | POA: Diagnosis not present

## 2022-02-14 DIAGNOSIS — D649 Anemia, unspecified: Secondary | ICD-10-CM | POA: Diagnosis not present

## 2022-02-14 DIAGNOSIS — Z7901 Long term (current) use of anticoagulants: Secondary | ICD-10-CM | POA: Diagnosis not present

## 2022-02-14 DIAGNOSIS — T8092XA Unspecified transfusion reaction, initial encounter: Secondary | ICD-10-CM

## 2022-02-14 DIAGNOSIS — K922 Gastrointestinal hemorrhage, unspecified: Secondary | ICD-10-CM | POA: Diagnosis present

## 2022-02-14 LAB — URINALYSIS, COMPLETE (UACMP) WITH MICROSCOPIC
Bilirubin Urine: NEGATIVE
Glucose, UA: NEGATIVE mg/dL
Ketones, ur: NEGATIVE mg/dL
Leukocytes,Ua: NEGATIVE
Nitrite: NEGATIVE
Protein, ur: NEGATIVE mg/dL
RBC / HPF: NONE SEEN RBC/hpf (ref 0–5)
Specific Gravity, Urine: 1.01 (ref 1.005–1.030)
pH: 6 (ref 5.0–8.0)

## 2022-02-14 LAB — COMPREHENSIVE METABOLIC PANEL
ALT: 12 U/L (ref 0–44)
AST: 25 U/L (ref 15–41)
Albumin: 2.6 g/dL — ABNORMAL LOW (ref 3.5–5.0)
Alkaline Phosphatase: 35 U/L — ABNORMAL LOW (ref 38–126)
Anion gap: 10 (ref 5–15)
BUN: 49 mg/dL — ABNORMAL HIGH (ref 8–23)
CO2: 23 mmol/L (ref 22–32)
Calcium: 7.9 mg/dL — ABNORMAL LOW (ref 8.9–10.3)
Chloride: 105 mmol/L (ref 98–111)
Creatinine, Ser: 1.42 mg/dL — ABNORMAL HIGH (ref 0.44–1.00)
GFR, Estimated: 36 mL/min — ABNORMAL LOW (ref >60)
Glucose, Bld: 89 mg/dL (ref 70–99)
Potassium: 3.4 mmol/L — ABNORMAL LOW (ref 3.5–5.1)
Sodium: 138 mmol/L (ref 135–145)
Total Bilirubin: 1.8 mg/dL — ABNORMAL HIGH (ref 0.3–1.2)
Total Protein: 5.1 g/dL — ABNORMAL LOW (ref 6.5–8.1)

## 2022-02-14 LAB — CBC
HCT: 22.7 % — ABNORMAL LOW (ref 36.0–46.0)
HCT: 25 % — ABNORMAL LOW (ref 36.0–46.0)
HCT: 23.7 % — ABNORMAL LOW (ref 36.0–46.0)
Hemoglobin: 7.2 g/dL — ABNORMAL LOW (ref 12.0–15.0)
Hemoglobin: 7.8 g/dL — ABNORMAL LOW (ref 12.0–15.0)
Hemoglobin: 7.9 g/dL — ABNORMAL LOW (ref 12.0–15.0)
MCH: 28.2 pg (ref 26.0–34.0)
MCH: 28.6 pg (ref 26.0–34.0)
MCH: 29.3 pg (ref 26.0–34.0)
MCHC: 31.7 g/dL (ref 30.0–36.0)
MCHC: 31.2 g/dL (ref 30.0–36.0)
MCHC: 33.3 g/dL (ref 30.0–36.0)
MCV: 89 fL (ref 80.0–100.0)
MCV: 91.6 fL (ref 80.0–100.0)
MCV: 87.8 fL (ref 80.0–100.0)
Platelets: 139 10*3/uL — ABNORMAL LOW (ref 150–400)
Platelets: 145 10*3/uL — ABNORMAL LOW (ref 150–400)
Platelets: 131 10*3/uL — ABNORMAL LOW (ref 150–400)
RBC: 2.55 MIL/uL — ABNORMAL LOW (ref 3.87–5.11)
RBC: 3.23 MIL/uL — ABNORMAL LOW (ref 3.87–5.11)
RBC: 2.73 MIL/uL — ABNORMAL LOW (ref 3.87–5.11)
RBC: 2.7 MIL/uL — ABNORMAL LOW (ref 3.87–5.11)
RDW: 16.7 % — ABNORMAL HIGH (ref 11.5–15.5)
RDW: 16.4 % — ABNORMAL HIGH (ref 11.5–15.5)
RDW: 16.5 % — ABNORMAL HIGH (ref 11.5–15.5)
WBC: 3.4 10*3/uL — ABNORMAL LOW (ref 4.0–10.5)
WBC: 4.2 10*3/uL (ref 4.0–10.5)
WBC: 9.9 10*3/uL (ref 4.0–10.5)
nRBC: 0.6 % — ABNORMAL HIGH (ref 0.0–0.2)
nRBC: 1.7 % — ABNORMAL HIGH (ref 0.0–0.2)
nRBC: 0.2 % (ref 0.0–0.2)

## 2022-02-14 LAB — MAGNESIUM: Magnesium: 2.1 mg/dL (ref 1.7–2.4)

## 2022-02-14 LAB — PREPARE RBC (CROSSMATCH)

## 2022-02-14 LAB — TROPONIN I (HIGH SENSITIVITY)
Troponin I (High Sensitivity): 343 ng/L (ref <18)
Troponin I (High Sensitivity): 1202 ng/L (ref <18)

## 2022-02-14 LAB — PREALBUMIN: Prealbumin: 12 mg/dL — ABNORMAL LOW (ref 18–38)

## 2022-02-14 LAB — PHOSPHORUS: Phosphorus: 2.5 mg/dL (ref 2.5–4.6)

## 2022-02-14 MED ORDER — LACTATED RINGERS IV SOLN: INTRAVENOUS | Status: DC

## 2022-02-14 MED ORDER — ACETAMINOPHEN 325 MG PO TABS
650.0000 mg | ORAL_TABLET | Freq: Once | ORAL | Status: AC
  Administered 2022-02-14: 650 mg via ORAL
  Filled 2022-02-14: qty 2

## 2022-02-14 MED ORDER — LACTATED RINGERS IV BOLUS
500.0000 mL | Freq: Once | INTRAVENOUS | Status: AC
  Administered 2022-02-14: 500 mL via INTRAVENOUS

## 2022-02-14 MED ORDER — GUAIFENESIN-DM 100-10 MG/5ML PO SYRP
5.0000 mL | ORAL_SOLUTION | ORAL | Status: DC | PRN
  Administered 2022-02-18 – 2022-02-19 (×4): 5 mL via ORAL
  Filled 2022-02-14 (×8): qty 5

## 2022-02-14 MED ORDER — SODIUM CHLORIDE 0.9 % IV SOLN: Freq: Once | INTRAVENOUS | Status: AC

## 2022-02-14 MED ORDER — METHYLPREDNISOLONE SODIUM SUCC 125 MG IJ SOLR
125.0000 mg | Freq: Once | INTRAMUSCULAR | Status: AC
  Administered 2022-02-14: 125 mg via INTRAVENOUS
  Filled 2022-02-14: qty 2

## 2022-02-14 MED ORDER — SODIUM CHLORIDE 0.9% IV SOLUTION: Freq: Once | INTRAVENOUS | Status: AC

## 2022-02-14 MED ORDER — PIPERACILLIN-TAZOBACTAM 3.375 G IVPB
3.3750 g | Freq: Three times a day (TID) | INTRAVENOUS | Status: AC
  Administered 2022-02-14 – 2022-02-15 (×5): 3.375 g via INTRAVENOUS
  Filled 2022-02-14 (×5): qty 50

## 2022-02-14 MED ORDER — DIPHENHYDRAMINE HCL 50 MG/ML IJ SOLN
25.0000 mg | Freq: Once | INTRAMUSCULAR | Status: AC
  Administered 2022-02-14: 25 mg via INTRAVENOUS
  Filled 2022-02-14: qty 1

## 2022-02-14 MED ORDER — FUROSEMIDE 10 MG/ML IJ SOLN
20.0000 mg | Freq: Once | INTRAMUSCULAR | Status: AC
  Administered 2022-02-14: 20 mg via INTRAVENOUS
  Filled 2022-02-14: qty 2

## 2022-02-14 MED ORDER — MUPIROCIN 2 % EX OINT
1.0000 | TOPICAL_OINTMENT | Freq: Two times a day (BID) | CUTANEOUS | Status: DC
  Filled 2022-02-14: qty 22

## 2022-02-14 NOTE — ED Notes (Signed)
NT Dyann Ruddle alerted this RN to room due to patient stating she "wasn't feeling good" and has "bad chills."  This RN stopped her blood, disconnected and pulled out the blood that was in the pigtail of IV.  This RN pulled about 41m of blood.  Pt alert and oriented but pale and shaking at this time.  Pt desat to about 77% on 2L nasal cannula.  This RN adjusted to 10L nasal cannula which bought her back to 90% saturation.  ERaquel Sarnaher primary RN alerted of patient possible reaction and in room by 1109.

## 2022-02-14 NOTE — ED Notes (Signed)
GI NP at bedside. 

## 2022-02-14 NOTE — ED Notes (Signed)
Pt provided gingerale. NPO at midnight.

## 2022-02-14 NOTE — ED Notes (Signed)
Lav and dark green, UA sent to lab. Notified blood bank

## 2022-02-14 NOTE — Progress Notes (Signed)
PROGRESS NOTE  [name] ZLD:[id number] DOB: 1934/03/08 DOA: 02/13/2022 PCP: Ria Bush, MD   LOS: 1 day   Brief Narrative / Interim history: 88 year old female with A-fib on Eliquis, prior history of GI bleeds due to AVMs, hypertension, anemia, comes to the hospital with complaints of melena.  She was diagnosed with COVID on 10/5 (symptoms started 10/3), received antivirals and steroids with improvement in her symptoms.  Few days ago, she has been starting to have melena and became extremely weak and came to the hospital.  Significant events: 10/11-admit to hospital  Significant imaging / results / micro data: Chest x-ray 10/11-small bilateral pleural effusions 10/11-SARS-CoV-2 positive  Subjective / 24h Interval events: She is complaining of weakness.  She had sore throat, headache, cough with her COVID infection but now these are all gone except for a mild residual cough.  Assesement and Plan: Principal Problem:   Upper GI bleed Active Problems:   Hypothyroidism   Mixed hyperlipidemia   Permanent atrial fibrillation   COPD (chronic obstructive pulmonary disease) (HCC)   Chronic kidney disease, stage 3b (HCC)   ICD (implantable cardioverter-defibrillator) in place   Hypertension   Chronic diastolic CHF (congestive heart failure) (HCC)   Hypokalemia   Symptomatic anemia   COVID-19 virus infection   Principal problem Upper GI bleed-gastroenterology consulted, appreciate input.  She has known gastric and small bowel AVMs.  She has been placed on PPI, Eliquis has been held for the past couple of days by patient.  Transfuse this morning, patient apprehensive about that since last time she apparently had a transfusion reaction.  Active problems Acute blood loss anemia-hemoglobin down to 7.2, from 11 a few months ago.  Getting blood transfusion now, continue to monitor CBC periodically.  COVID-19-seems to be resolving.  Original diagnosis was 7 days ago on 10/5.   She will need to be on isolation for total of 10 days, until 10/15  Paroxysmal A-fib on Eliquis-currently Eliquis is on hold.  She does not appear to be on rate limiting agents at home.  Continue to monitor on telemetry  ?Hypothyroidism -mentioned in the H&P, has been placed on Synthroid but I do not see this medication on her home regimen.  Will discuss with patient and family  ICD in place-noted  Essential hypertension-hold antihypertensives in the setting of GI bleed  Hyperlipidemia-continue statin  Chronic diastolic CHF-euvolemic, getting blood, will follow with 20 of Lasix  CKD 3B-baseline creatinine ranging anywhere from 1.3-1.7, currently at baseline.  Continue to monitor  COPD-chronic, stable, no wheezing  Scheduled Meds:  sodium chloride   Intravenous Once   ezetimibe  10 mg Oral Daily   levothyroxine  25 mcg Oral Q0600   [START ON 02/17/2022] pantoprazole  40 mg Intravenous Q12H   pantoprazole (PROTONIX) IV  40 mg Intravenous Once   rosuvastatin  40 mg Oral Daily   Continuous Infusions:  pantoprazole 8 mg/hr (02/14/22 0955)   PRN Meds:.acetaminophen **OR** acetaminophen, albuterol, guaiFENesin-dextromethorphan, ondansetron (ZOFRAN) IV  Current Outpatient Medications  Medication Instructions   acetaminophen (TYLENOL) 500-1,000 mg, Oral, Every 8 hours PRN   albuterol (VENTOLIN HFA) 108 (90 Base) MCG/ACT inhaler 2 puffs, Inhalation, Every 6 hours PRN   clobetasol cream (TEMOVATE) 0.05 % Apply to affected area left foot twice a day until improved. Avoid face, groin, axilla.   Cyanocobalamin (B-12 PO) 1 tablet, Oral, Daily   doxycycline (ADOXA) 100 mg, Oral, 2 times daily, Take with food   ELIQUIS 2.5 MG TABS tablet TAKE 1  TABLET BY MOUTH TWICE A DAY   fluticasone (FLONASE) 50 MCG/ACT nasal spray 2 sprays, Each Nare, Daily PRN   metolazone (ZAROXOLYN) 2.5 mg, Oral, As directed, Take every other Thursday with your morning dose of Torsemide   octreotide (SANDOSTATIN LAR)  10 mg, Intramuscular, Every 28 days   potassium chloride SA (KLOR-CON M) 20 MEQ tablet TAKE 2 TABLETS (40 MEQ) BY MOUTH 2 TIMESDAILY. TAKE 1 EXTRA TABLET ON THE DAYS YOU TAKE METOLAZONE.   torsemide (DEMADEX) 100 MG tablet TAKE 1 TABLET BY MOUTH EVERY MORNING (EVENING DOSE 80 MG)   vitamin C (ASCORBIC ACID) 500 mg, Oral, Daily   VITAMIN D PO 4,000 Units, Oral, Daily    Diet Orders (From admission, onward)     Start     Ordered   02/14/22 0001  Diet NPO time specified  Diet effective midnight        02/13/22 1923            DVT prophylaxis: SCDs Start: 02/13/22 1923   Lab Results  Component Value Date   PLT 139 (L) 02/14/2022      Code Status: Full Code  Family Communication: no family at bedside   Status is: Inpatient Remains inpatient appropriate because: Severity of illness   Level of care: Telemetry Medical  Consultants:  GI  Objective: Vitals:   02/14/22 0902 02/14/22 0915 02/14/22 0930 02/14/22 0945  BP: (!) 132/46 (!) 129/54 (!) 119/45 (!) 126/49  Pulse: 60 (!) 59 60 (!) 59  Resp: (!) '23 20 16 18  '$ Temp: 98.7 F (37.1 C)     TempSrc: Oral     SpO2: 100% 100% 100% 100%  Weight:      Height:        Intake/Output Summary (Last 24 hours) at 02/14/2022 1043 Last data filed at 02/14/2022 0955 Gross per 24 hour  Intake 102.57 ml  Output 1000 ml  Net -897.43 ml   Wt Readings from Last 3 Encounters:  02/13/22 64.4 kg  02/12/22 64.4 kg  01/09/22 66.4 kg    Examination:  Constitutional: NAD Eyes: no scleral icterus ENMT: Mucous membranes are moist.  Neck: normal, supple Respiratory: clear to auscultation bilaterally, no wheezing, no crackles. Normal respiratory effort.  Cardiovascular: Regular rate and rhythm, no murmurs / rubs / gallops.  Abdomen: non distended, no tenderness. Bowel sounds positive.  Musculoskeletal: no clubbing / cyanosis.   Data Reviewed: I have independently reviewed following labs and imaging studies   CBC Recent Labs   Lab 02/12/22 1655 02/13/22 1625 02/13/22 2106 02/14/22 0245  WBC 2.6* 3.0* 2.8* 3.4*  HGB 8.9* 7.8* 7.7* 7.2*  HCT 26.7* 23.8* 23.7* 22.7*  PLT 144 142* 140* 139*  MCV 84.0 88.1 88.1 89.0  MCH 28.0 28.9 28.6 28.2  MCHC 33.3 32.8 32.5 31.7  RDW 16.1* 16.6* 16.7* 16.7*  LYMPHSABS 364* 0.4*  --   --   MONOABS  --  0.3  --   --   EOSABS 0* 0.0  --   --   BASOSABS 10 0.0  --   --     Recent Labs  Lab 02/12/22 1655 02/13/22 1505 02/13/22 1625 02/14/22 0245  NA 140  --  136  --   K 4.2  --  3.4*  --   CL 98  --  100  --   CO2 23  --  27  --   GLUCOSE 97  --  102*  --   BUN 85*  --  68*  --   CREATININE 1.34*  --  1.32*  --   CALCIUM 9.2  --  8.5*  --   AST 22  --  21  --   ALT 11  --  14  --   ALKPHOS  --   --  36*  --   BILITOT 0.4  --  0.6  --   ALBUMIN  --   --  3.0*  --   MG  --   --   --  2.1  LATICACIDVEN  --  1.2  --   --   INR  --   --  1.2  --   TSH  --   --  1.835  --     ------------------------------------------------------------------------------------------------------------------ No results for input(s): "CHOL", "HDL", "LDLCALC", "TRIG", "CHOLHDL", "LDLDIRECT" in the last 72 hours.  Lab Results  Component Value Date   HGBA1C 4.6 (L) 02/09/2021   ------------------------------------------------------------------------------------------------------------------ Recent Labs    02/13/22 1625  TSH 1.835    Cardiac Enzymes No results for input(s): "CKMB", "TROPONINI", "MYOGLOBIN" in the last 168 hours.  Invalid input(s): "CK" ------------------------------------------------------------------------------------------------------------------    Component Value Date/Time   BNP 210.7 (H) 08/08/2021 1049    CBG: No results for input(s): "GLUCAP" in the last 168 hours.  Recent Results (from the past 240 hour(s))  SARS Coronavirus 2 by RT PCR (hospital order, performed in [facility] hospital lab) *cepheid single result test* Anterior Nasal Swab      Status: Abnormal   Collection Time: 02/13/22  4:04 PM   Specimen: Anterior Nasal Swab  Result Value Ref Range Status   SARS Coronavirus 2 by RT PCR POSITIVE (A) NEGATIVE Final    Comment: (NOTE) SARS-CoV-2 target nucleic acids are DETECTED  SARS-CoV-2 RNA is generally detectable in upper respiratory specimens  during the acute phase of infection.  Positive results are indicative  of the presence of the identified virus, but do not rule out bacterial infection or co-infection with other pathogens not detected by the test.  Clinical correlation with patient history and  other diagnostic information is necessary to determine patient infection status.  The expected result is negative.  Fact Sheet for Patients:   https://www.patel.info/   Fact Sheet for Healthcare Providers:   https://hall.com/    This test is not yet approved or cleared by the [place] FDA and  has been authorized for detection and/or diagnosis of SARS-CoV-2 by FDA under an Emergency Use Authorization (EUA).  This EUA will remain in effect (meaning this test can be used) for the duration of  the COVID-19 declaration under Section 564(b)(1)  of the Act, 21 U.S.C. section 360-bbb-3(b)(1), unless the authorization is terminated or revoked sooner.   Performed at [facility], [place] [address], [place], [place] [address]      Radiology Studies: DG Chest 2 View  Result Date: 02/13/2022 CLINICAL DATA:  Post COVID low hemoglobin fatigue EXAM: CHEST - 2 VIEW COMPARISON:  03/08/2021 FINDINGS: Left-sided pacing device as before. Small bilateral pleural effusions. Stable cardiomediastinal silhouette with aortic atherosclerosis. No consolidation or pneumothorax. Chronic compression fracture of the upper lumbar spine. Stable sclerotic focus in the proximal left humerus. IMPRESSION: Small bilateral pleural effusions Electronically Signed   By: Donavan Foil M.D.   On: 02/13/2022  19:23     Marzetta Board, MD, PhD [facility]  Between 7 am - 7 pm I am available, please contact me via Amion (for emergencies) or Securechat (non urgent messages)  Between  7 pm - 7 am I am not available, please contact night coverage MD/APP via Amion

## 2022-02-14 NOTE — ED Notes (Signed)
Consent for small bowel endoscopy obtained at this time and at bedside.

## 2022-02-14 NOTE — ED Notes (Signed)
Hypotension post 2nd 500cc LR bolus discussed with MD,

## 2022-02-14 NOTE — Progress Notes (Signed)
OT Cancellation Note  Patient Details Name: [name] MRN: [id number] DOB: Feb 25, 1934   Cancelled Treatment:    Reason Eval/Treat Not Completed: Medical issues which prohibited therapy Discussed pt case with RN who requested OT to hold OOB assessment due to pt receiving blood transfusion at this time. Will follow up as schedule permits.  Layla Maw 02/14/2022, 10:44 AM

## 2022-02-14 NOTE — Consult Note (Signed)
Consultation Note   Referring Provider: [facility] PCP: Ria Bush, MD Primary Gastroenterologist: Gerrit Heck, MD Reason for consultation: [facility] Day: 2  Assessment / Plan   # 88 yo female with history of recurrent GI bleeding / IDA secondary to gastric and small bowel AVMS. In ED with recurrent melena over last few days with concurrent drop in hgb. Takes Eliquis, last dose was Tuesday.  Hgb is 7.2, down from 11 in July. Getting blood transfusion now.  She will need small bowel enteroscopy.  The risks and benefits of small bowel enteroscopy were discussed with the patient who agrees to proceed. Timing of procedure to be announced. Keep NPO PPI infusion going. No active bleeding / BMs yesterday or today but will keep infusion going until workup complete.  If enteroscopy negative then will need to reevaluate ileal ulcers seen on VCE in July.  Apparently our office has been working on trying to get Octreotide injections approved by insurance  # COVID, diagnosed 7 days ago. Symptoms improved.   # Afib on Eliquis. Last dose was Tuesday  # CKD stage 3   # See PMH for additional medical problems   HPI   [name] is a 88 y.o. female with a past medical history significant for history of A-fib (on Eliquis),  ischemic colitis (2020), cardiomyopathy s/p ICD, chronic diastolic CHF, COPD, hypothyroidism, TIA, CKD 3, renal cell cancer s/p right nephrectomy, peripheral artery disease , cholecystectomy, diverticulosis, small bowel and gastric AVMs, distal small bowel ulcers, ischemic colitis.   See PMH for any additional medical problems.  Andria was last seen in our office 12/26/21 for follow up on IDA. She has had previous admissions for anemia/GI bleed secondary to small bowel and gastric AVMs. Also July 2023 VCE findings of non-bleeding ileal ulcers likely not in reach of push enteroscopy She has been treated  with IV iron and oral iron . Oral iron stopped several weeks ago.  At time of her last visit with [place] the plan was to try and get approval for octreotide LAR. Also consider CT enterography in the future if needing to work-up small, nonbleeding ileal ulcers  Interval History:  Patient presented to ED yesterday for evaluation of melena.  Significant findings:  VSS    Hgb 8.9 >> down to 7.2 today (baseline 11) Platelets 139 Cr 1.32 CXR >> small bil effusions FOBT positive  [name] hasn't been on oral iron for several weeks. A few days ago she began have dark , loose stools again. She has been lightheaded. No abdominal pain, N/V. Last dose of Eliquis was Monday or Tuesday.  No BMs in 24 hours. She is NOT on Sandostatin. She hasn't heard anything from our office as to whether it was approved by insurance.   Previous GI Evaluation     Most recent endoscopies  Oct 2020 EGD mild gastritis.  Colonoscopy with ischemic colitis.  VCE with small bowel AVMs in duodenum and small streak of blood in small bowel.    Nov 2021 push enteroscopy  Push enteroscopy with 1 duodenal AVM, 2 jejunal AVMs (1 actively bleeding) treated with APC  Oct 2022 push enteroscopy  Push enteroscopy with a few gastric AVMs treated with APC, for  duodenal bulb/D2 AVMs (actively bleeding) treated with APC, single proximal jejunal AVM treated with APC.  11/14/2021: VCE: 1 tiny nonbleeding AVM in proximal duodenum, 1 small nonbleeding AVM in proximal small bowel, 3-4 nonbleeding ulcers in mid to distal small bowel, likely beyond reach of single balloon enteroscopy  Recent Labs and Imaging DG Chest 2 View  Result Date: 02/13/2022 CLINICAL DATA:  Post COVID low hemoglobin fatigue EXAM: CHEST - 2 VIEW COMPARISON:  03/08/2021 FINDINGS: Left-sided pacing device as before. Small bilateral pleural effusions. Stable cardiomediastinal silhouette with aortic atherosclerosis. No consolidation or pneumothorax. Chronic compression fracture of  the upper lumbar spine. Stable sclerotic focus in the proximal left humerus. IMPRESSION: Small bilateral pleural effusions Electronically Signed   By: Donavan Foil M.D.   On: 02/13/2022 19:23    Labs:  Recent Labs    02/13/22 1625 02/13/22 2106 02/14/22 0245  WBC 3.0* 2.8* 3.4*  HGB 7.8* 7.7* 7.2*  HCT 23.8* 23.7* 22.7*  PLT 142* 140* 139*   Recent Labs    02/12/22 1655 02/13/22 1625  NA 140 136  K 4.2 3.4*  CL 98 100  CO2 23 27  GLUCOSE 97 102*  BUN 85* 68*  CREATININE 1.34* 1.32*  CALCIUM 9.2 8.5*   Recent Labs    02/13/22 1625  PROT 5.5*  ALBUMIN 3.0*  AST 21  ALT 14  ALKPHOS 36*  BILITOT 0.6   No results for input(s): "HEPBSAG", "HCVAB", "HEPAIGM", "HEPBIGM" in the last 72 hours. Recent Labs    02/13/22 1625  LABPROT 15.5*  INR 1.2    Past Medical History:  Diagnosis Date   Actinic keratosis 01/17/2015   R forearm   Adjustment disorder with anxiety    Anemia    Anginal pain ([place])    Arthritis    "some in my hands" (11/11/2012)   Atrial fibrillation (HCC)    Atypical mole 03/25/2018   L forearm - severe   Automatic implantable cardioverter-defibrillator in situ    Avascular necrosis of hip ([place]) 05/03/2011   Blood in stool    Carotid artery stenosis 09/2007   a. 09/2007: 60-79% bilateral (stable); b. 10/2008: 40-59% R 60-79%    Chest pain, unspecified    Chronic airway obstruction, not elsewhere classified    Chronic bronchitis ([place])    "get it some; not q year" (11/11/2012)   Chronic diastolic CHF (congestive heart failure) ([place]) 06/04/2013   Chronic kidney disease, unspecified    Coronary artery disease    non-obstructive by 2006 cath   Displaced fracture of left femoral neck ([place]) 10/09/2018   Frequent UTI    "get them a couple times/yr" (11/11/2012)   GI bleed 03/28/2020   Hemorrhage of rectum and anus    High cholesterol    History of blood transfusion 04/2011   "after hip OR" (11/11/2012)   Hx of renal cell cancer    Hypertension  05/20/2011   Hypertr obst cardiomyop    Hypotension, unspecified    cardiac cath 2006..nonobstructive CAD 30-40s lesions.Marland KitchenETT 1/09 nondiagnostic due to poor HR response..Right Renal Cancer 2003   Long term (current) use of anticoagulants    Malignant neoplasm of kidney, except pelvis    Osteoarthritis of right hip    Other and unspecified coagulation defects    PONV (postoperative nausea and vomiting)    Presence of permanent cardiac pacemaker    RECTAL BLEEDING 10/13/2009   Qualifier: Diagnosis of  By: Chester Holstein NP, Daran Favaro     Renal cancer [facility]) 06/2001  Right   Secondary cardiomyopathy, unspecified    Sinus bradycardia    Squamous cell carcinoma of skin 01/17/2015   R lat wrist   Squamous cell carcinoma of skin 01/29/2018   R post upper leg - superficially invasive   Squamous cell carcinoma of skin 03/25/2018   L lat foot   Squamous cell carcinoma of skin 11/09/2020   left lat foot - EDC 01/01/21, recurrent 01/31/21 - MOHs 02/22/21   Squamous cell carcinoma of skin 12/19/2021   Right Posterior Medial Thigh, EDC   Squamous cell carcinoma of skin 12/19/2021   SCC IS, L lat heel, EDC 02/04/2022   Squamous cell carcinoma of skin 01/21/2022   L forearm, EDC 02/04/2022   Squamous cell carcinoma of skin 01/21/2022   R lower leg below knee, EDC 02/04/2022   Squamous cell carcinoma of skin 01/21/2022   SCCIS, R post heel, EDC 02/04/2022   Urge incontinence     Past Surgical History:  Procedure Laterality Date   ABDOMINAL AORTOGRAM W/LOWER EXTREMITY N/A 02/12/2021   Procedure: ABDOMINAL AORTOGRAM W/LOWER EXTREMITY;  Surgeon: Waynetta Sandy, MD;  Location: [place] [facility];  Service: Cardiovascular;  Laterality: N/A;   ABDOMINAL HYSTERECTOMY  1975   for benign causes   APPENDECTOMY     BI-VENTRICULAR PACEMAKER UPGRADE  05/04/2010   BIOPSY  02/28/2019   Procedure: BIOPSY;  Surgeon: Milus Banister, MD;  Location: [facility] ENDOSCOPY;  Service: Endoscopy;;   CARDIAC  CATHETERIZATION  2006   CARDIOVERSION N/A 02/20/2018   Procedure: CARDIOVERSION;  Surgeon: Minna Merritts, MD;  Location: [place] [facility];  Service: Cardiovascular;  Laterality: N/A;   CARDIOVERSION N/A 03/27/2018   Procedure: CARDIOVERSION;  Surgeon: Minna Merritts, MD;  Location: [facility] ORS;  Service: Cardiovascular;  Laterality: N/A;   CATARACT EXTRACTION W/ INTRAOCULAR LENS  IMPLANT, BILATERAL  01/2006-02-2006   CHOLECYSTECTOMY N/A 11/11/2012   Procedure: LAPAROSCOPIC CHOLECYSTECTOMY WITH INTRAOPERATIVE CHOLANGIOGRAM;  Surgeon: Imogene Burn. Georgette Dover, MD;  Location: [place];  Service: General;  Laterality: N/A;   COLONOSCOPY WITH PROPOFOL N/A 02/28/2019   Procedure: COLONOSCOPY WITH PROPOFOL;  Surgeon: Milus Banister, MD;  Location[facility] ENDOSCOPY;  Service: Endoscopy;  Laterality: N/A;   ENTEROSCOPY N/A 03/30/2020   Procedure: ENTEROSCOPY;  Surgeon: Lavena Bullion, DO;  Location: [place];  Service: Gastroenterology;  Laterality: N/A;   ENTEROSCOPY N/A 02/10/2021   Procedure: ENTEROSCOPY;  Surgeon: Carol Ada, MD;  Location: [place];  Service: Endoscopy;  Laterality: N/A;   EP IMPLANTABLE DEVICE N/A 02/21/2016   Procedure: ICD Generator Changeout;  Surgeon: Deboraha Sprang, MD;  Location: [place] [facility];  Service: Cardiovascular;  Laterality: N/A;   ESOPHAGOGASTRODUODENOSCOPY (EGD) WITH PROPOFOL N/A 02/28/2019   Procedure: ESOPHAGOGASTRODUODENOSCOPY (EGD) WITH PROPOFOL;  Surgeon: Milus Banister, MD;  Location: [facility] ENDOSCOPY;  Service: Endoscopy;  Laterality: N/A;   GIVENS CAPSULE STUDY N/A 03/15/2019   Procedure: GIVENS CAPSULE STUDY;  Surgeon: Thornton Park, MD;  Location: [place];  Service: Gastroenterology;  Laterality: N/A;   HOT HEMOSTASIS N/A 03/30/2020   Procedure: HOT HEMOSTASIS (ARGON PLASMA COAGULATION/BICAP);  Surgeon: Lavena Bullion, DO;  Location: [facility] ENDOSCOPY;  Service: Gastroenterology;  Laterality: N/A;   HOT HEMOSTASIS N/A 02/10/2021   Procedure: HOT  HEMOSTASIS (ARGON PLASMA COAGULATION/BICAP);  Surgeon: Carol Ada, MD;  Location: [place];  Service: Endoscopy;  Laterality: N/A;   INSERT / REPLACE / REMOVE PACEMAKER  05-01-11   02-28-05-/05-04-10-ICD-MEDTRONIC MAXIMAL DR   JOINT REPLACEMENT     LAPAROSCOPIC CHOLECYSTECTOMY  11/11/2012   LAPAROSCOPIC LYSIS OF ADHESIONS N/A 11/11/2012   Procedure: LAPAROSCOPIC LYSIS OF ADHESIONS;  Surgeon: Imogene Burn. Georgette Dover, MD;  Location: [place];  Service: General;  Laterality: N/A;   NEPHRECTOMY Right 06/2001    S/P RENAL CELL CANCER   PERIPHERAL VASCULAR INTERVENTION Bilateral 02/12/2021   Procedure: PERIPHERAL VASCULAR INTERVENTION;  Surgeon: Waynetta Sandy, MD;  Location: [place] [facility] LAB;  Service: Cardiovascular;  Laterality: Bilateral;  Iliac artery stents   PRESSURE SENSOR/CARDIOMEMS N/A 02/03/2019   Procedure: PRESSURE SENSOR/CARDIOMEMS;  Surgeon: Larey Dresser, MD;  Location: [place] [facility] LAB;  Service: Cardiovascular;  Laterality: N/A;   RIGHT HEART CATH N/A 11/09/2018   Procedure: RIGHT HEART CATH;  Surgeon: Larey Dresser, MD;  Location: [place] [facility];  Service: Cardiovascular;  Laterality: N/A;   RIGHT HEART CATH N/A 03/08/2019   Procedure: RIGHT HEART CATH;  Surgeon: Larey Dresser, MD;  Location: [place] [facility] LAB;  Service: Cardiovascular;  Laterality: N/A;   SUBMUCOSAL TATTOO INJECTION  02/28/2019   Procedure: SUBMUCOSAL TATTOO INJECTION;  Surgeon: Milus Banister, MD;  Location: [facility] ENDOSCOPY;  Service: Endoscopy;;   TOTAL HIP ARTHROPLASTY Right 05/03/2011   Procedure: TOTAL HIP ARTHROPLASTY ANTERIOR APPROACH;  Surgeon: Mcarthur Rossetti;  Location: [facility];  Service: Orthopedics;  Laterality: Right;  Removal of Cannulated Screws Right Hip, Right Direct Anterior Hip Replacement   TOTAL HIP ARTHROPLASTY Left 10/09/2018   Procedure: TOTAL HIP ARTHROPLASTY ANTERIOR APPROACH;  Surgeon: Rod Can, MD;  Location: [place];  Service: Orthopedics;  Laterality: Left;     Family History  Problem Relation Age of Onset   Heart failure Mother    Early death Father        car accident   Breast cancer Maternal Aunt 82   Breast cancer Cousin    Breast cancer Other    Colon cancer Neg Hx    Esophageal cancer Neg Hx    Liver disease Neg Hx    Pancreatic cancer Neg Hx    Rectal cancer Neg Hx     Prior to Admission medications   Medication Sig Start Date End Date Taking? Authorizing Provider  acetaminophen (TYLENOL) 500 MG tablet Take 500-1,000 mg by mouth every 8 (eight) hours as needed for mild pain or headache.   Yes [provider]  albuterol (VENTOLIN HFA) 108 (90 Base) MCG/ACT inhaler Inhale 2 puffs into the lungs every 6 (six) hours as needed for wheezing or shortness of breath. 02/08/22  Yes Waunita Schooner, MD  Cyanocobalamin (B-12 PO) Take 1 tablet by mouth daily.    Yes [provider]  ELIQUIS 2.5 MG TABS tablet TAKE 1 TABLET BY MOUTH TWICE A DAY Patient taking differently: Take 2.5 mg by mouth 2 (two) times daily. 06/11/21  Yes Larey Dresser, MD  fluticasone [facility]) 50 MCG/ACT nasal spray Place 2 sprays into both nostrils daily as needed for allergies or rhinitis. 03/08/21  Yes Waunita Schooner, MD  metolazone (ZAROXOLYN) 2.5 MG tablet Take 1 tablet (2.5 mg total) by mouth as directed. Take every other Thursday with your morning dose of Torsemide Patient taking differently: Take 2.5 mg by mouth once a week. Take Thursday with your morning dose of Torsemide 08/08/21  Yes [place], [place], FNP  potassium chloride SA (KLOR-CON M) 20 MEQ tablet TAKE 2 TABLETS (40 MEQ) BY MOUTH 2 TIMESDAILY. TAKE 1 EXTRA TABLET ON THE DAYS YOU TAKE METOLAZONE. Patient taking differently: Take 40-60 mEq by mouth 2 (two) times  daily. Take 1 extra tablet (20 mEq) on the days you take metolazone. 10/04/21  Yes Larey Dresser, MD  vitamin C (ASCORBIC ACID) 250 MG tablet Take 500 mg by mouth daily.   Yes [provider]  VITAMIN D PO Take 4,000 Units  by mouth daily.   Yes [provider]  clobetasol cream (TEMOVATE) 0.05 % Apply to affected area left foot twice a day until improved. Avoid face, groin, axilla. Patient not taking: Reported on 02/13/2022 12/19/21   Brendolyn Patty, MD  doxycycline (ADOXA) 100 MG tablet Take 1 tablet (100 mg total) by mouth 2 (two) times daily for 10 days. Take with food Patient not taking: Reported on 02/12/2022 02/04/22 02/14/22  Brendolyn Patty, MD  octreotide (SANDOSTATIN LAR) 10 MG injection Inject 10 mg into the muscle every 28 (twenty-eight) days. Patient not taking: Reported on 02/13/2022 12/26/21   Cirigliano, Vito V, DO  torsemide (DEMADEX) 100 MG tablet TAKE 1 TABLET BY MOUTH EVERY MORNING (EVENING DOSE 80 MG) Patient taking differently: Take 80-100 mg by mouth See admin instructions. Take '100mg'$  by mouth every morning and 80 every evening. 01/03/22   Larey Dresser, MD    Current Facility-Administered Medications  Medication Dose Route Frequency Provider Last Rate Last Admin   0.9 %  sodium chloride infusion (Manually program via Guardrails IV Fluids)   Intravenous Once Caren Griffins, MD   Held at 02/14/22 0825   acetaminophen (TYLENOL) tablet 650 mg  650 mg Oral Q6H PRN Toy Baker, MD       Or   acetaminophen (TYLENOL) suppository 650 mg  650 mg Rectal Q6H PRN Doutova, Anastassia, MD       albuterol (PROVENTIL) (2.5 MG/3ML) 0.083% nebulizer solution 2.5 mg  2.5 mg Nebulization Q2H PRN Doutova, Anastassia, MD       ezetimibe (ZETIA) tablet 10 mg  10 mg Oral Daily Doutova, Anastassia, MD   10 mg at 02/13/22 2056   guaiFENesin (MUCINEX) 12 hr tablet 600 mg  600 mg Oral BID Doutova, Nyoka Lint, MD       levothyroxine (SYNTHROID) tablet 25 mcg  25 mcg Oral Q0600 Toy Baker, MD   25 mcg at 02/14/22 0649   ondansetron (ZOFRAN) injection 4 mg  4 mg Intravenous Q6H PRN Doutova, Anastassia, MD   4 mg at 02/14/22 0152   [START ON 02/17/2022] pantoprazole (PROTONIX) injection 40 mg  40  mg Intravenous Q12H Doutova, Anastassia, MD       pantoprazole (PROTONIX) injection 40 mg  40 mg Intravenous Once Doutova, Anastassia, MD       pantoprozole (PROTONIX) 80 mg /NS 100 mL infusion  8 mg/hr Intravenous Continuous Doutova, Anastassia, MD 10 mL/hr at 02/14/22 0725 8 mg/hr at 02/14/22 0725   rosuvastatin (CRESTOR) tablet 40 mg  40 mg Oral Daily Doutova, Anastassia, MD   40 mg at 02/13/22 2104   Current Outpatient Medications  Medication Sig Dispense Refill   acetaminophen (TYLENOL) 500 MG tablet Take 500-1,000 mg by mouth every 8 (eight) hours as needed for mild pain or headache.     albuterol (VENTOLIN HFA) 108 (90 Base) MCG/ACT inhaler Inhale 2 puffs into the lungs every 6 (six) hours as needed for wheezing or shortness of breath. 8 g 2   Cyanocobalamin (B-12 PO) Take 1 tablet by mouth daily.      ELIQUIS 2.5 MG TABS tablet TAKE 1 TABLET BY MOUTH TWICE A DAY (Patient taking differently: Take 2.5 mg by mouth 2 (two) times daily.) 60  tablet 11   fluticasone (FLONASE) 50 MCG/ACT nasal spray Place 2 sprays into both nostrils daily as needed for allergies or rhinitis. 16 g 6   metolazone (ZAROXOLYN) 2.5 MG tablet Take 1 tablet (2.5 mg total) by mouth as directed. Take every other Thursday with your morning dose of Torsemide (Patient taking differently: Take 2.5 mg by mouth once a week. Take Thursday with your morning dose of Torsemide) 60 tablet 2   potassium chloride SA (KLOR-CON M) 20 MEQ tablet TAKE 2 TABLETS (40 MEQ) BY MOUTH 2 TIMESDAILY. TAKE 1 EXTRA TABLET ON THE DAYS YOU TAKE METOLAZONE. (Patient taking differently: Take 40-60 mEq by mouth 2 (two) times daily. Take 1 extra tablet (20 mEq) on the days you take metolazone.) 360 tablet 3   vitamin C (ASCORBIC ACID) 250 MG tablet Take 500 mg by mouth daily.     VITAMIN D PO Take 4,000 Units by mouth daily.     clobetasol cream (TEMOVATE) 0.05 % Apply to affected area left foot twice a day until improved. Avoid face, groin, axilla. (Patient  not taking: Reported on 02/13/2022) 30 g 0   doxycycline (ADOXA) 100 MG tablet Take 1 tablet (100 mg total) by mouth 2 (two) times daily for 10 days. Take with food (Patient not taking: Reported on 02/12/2022) 20 tablet 0   octreotide (SANDOSTATIN LAR) 10 MG injection Inject 10 mg into the muscle every 28 (twenty-eight) days. (Patient not taking: Reported on 02/13/2022) 1 each 5   torsemide (DEMADEX) 100 MG tablet TAKE 1 TABLET BY MOUTH EVERY MORNING (EVENING DOSE 80 MG) (Patient taking differently: Take 80-100 mg by mouth See admin instructions. Take '100mg'$  by mouth every morning and 80 every evening.) 30 tablet 11    Allergies as of 02/13/2022 - Review Complete 02/13/2022  Allergen Reaction Noted   Dilaudid [hydromorphone] Other (See Comments) 11/06/2018   Fentanyl Other (See Comments) 02/05/2019   Codeine Nausea And Vomiting    Morphine and related Nausea And Vomiting 11/04/2012   Hydrocodone Nausea Only and Other (See Comments) 02/13/2022   Oxycodone Nausea Only and Other (See Comments) 02/13/2022   Tramadol Nausea Only and Other (See Comments) 02/13/2022   Whole blood Itching and Nausea And Vomiting 02/08/2021   Sulfonamide derivatives Other (See Comments)     Social History   Socioeconomic History   Marital status: Widowed    Spouse name: Not on file   Number of children: 2   Years of education: high school   Highest education level: Not on file  Occupational History   Occupation: Retired    Fish farm manager: RETIRED  Tobacco Use   Smoking status: Former    Packs/day: 0.50    Years: 40.00    Total pack years: 20.00    Types: Cigarettes    Quit date: 05/06/2001    Years since quitting: 20.7   Smokeless tobacco: Never  Vaping Use   Vaping Use: Never used  Substance and Sexual Activity   Alcohol use: No    Alcohol/week: 0.0 standard drinks of alcohol   Drug use: No   Sexual activity: Not Currently  Other Topics Concern   Not on file  Social History Narrative   Would desire  CPR   07/11/20   From: the area   Living: with great-grandson - [name] (2003)   Work: retired - clerical      Family: 2 children - [name] and [name] - 2 grand chlidren - 5 great grand children      Enjoys:  spending time with friends - watch move, eat out, spend time      Exercise: walking around the house   Diet: not good, limits red meat, limits salt, low appetite      Safety   Seat belts: Yes    Guns: Yes  and secure   Safe in relationships: Yes    Social Determinants of Health   Financial Resource Strain: Low Risk  (09/24/2021)   Overall Financial Resource Strain (CARDIA)    Difficulty of Paying Living Expenses: Not hard at all  Food Insecurity: No Food Insecurity (09/24/2021)   Hunger Vital Sign    Worried About Running Out of Food in the Last Year: Never true    [place] in the Last Year: Never true  Transportation Needs: No Transportation Needs (09/24/2021)   PRAPARE - Hydrologist (Medical): No    Lack of Transportation (Non-Medical): No  Physical Activity: Inactive (09/24/2021)   Exercise Vital Sign    Days of Exercise per Week: 0 days    Minutes of Exercise per Session: 0 min  Stress: No Stress Concern Present (09/24/2021)   [place]    Feeling of Stress : Not at all  Social Connections: Not on file  Intimate Partner Violence: Not on file    Review of Systems: All systems reviewed and negative except where noted in HPI.  Physical Exam: Vital signs in last 24 hours: Temp:  [98.1 F (36.7 C)-99.1 F (37.3 C)] 99.1 F (37.3 C) (10/12 0652) Pulse Rate:  [54-63] 59 (10/12 0645) Resp:  [13-28] 24 (10/12 0645) BP: (90-153)/(40-68) 115/48 (10/12 0645) SpO2:  [92 %-100 %] 100 % (10/12 0645) Weight:  [64.4 kg] 64.4 kg (10/11 1507)    General:  Alert female in NAD Psych:  Pleasant, cooperative. Normal mood and affect Eyes: Pupils equal, no icterus. Conjunctive  pink Ears:  Normal auditory acuity Nose: No deformity, discharge or lesions Neck:  Supple, no masses felt Lungs:  Clear to auscultation.  Heart:  Regular rate, + murmur.  No lower extremity edema Abdomen:  Soft, nondistended, nontender, active bowel sounds, no masses felt Rectal :  Deferred Msk: Symmetrical without gross deformities.  Neurologic:  Alert, oriented, grossly normal neurologically Skin:  Intact without significant lesions.    Intake/Output from previous day: No intake/output data recorded. Intake/Output this shift:  Total I/O In: 82.1 [I.V.:82.1] Out: -     Principal Problem:   Upper GI bleed Active Problems:   Mixed hyperlipidemia   Permanent atrial fibrillation   COPD (chronic obstructive pulmonary disease) (HCC)   Chronic kidney disease, stage 3b (HCC)   ICD (implantable cardioverter-defibrillator) in place   Hypertension   Chronic diastolic CHF (congestive heart failure) (HCC)   Hypokalemia   Symptomatic anemia   Hypothyroidism   COVID-19 virus infection    Tye Savoy, NP-C @  02/14/2022, 8:31 AM

## 2022-02-14 NOTE — ED Notes (Signed)
Notified Gherghe MD about pt temp 102.6 and BP 92/53

## 2022-02-14 NOTE — ED Notes (Signed)
Electronic consent for blood obtained at this time

## 2022-02-14 NOTE — ED Notes (Signed)
Calling lab to have CBC cancelled d/t not accurate post transfusion blood draw time. Results skewed d/t blood had been stopped about 30 mins prior to lab draw. Notified MD

## 2022-02-14 NOTE — Consult Note (Signed)
NAME:  [name], MRN:  [id number], DOB:  08-24-1933, LOS: 1 ADMISSION DATE:  02/13/2022, CONSULTATION DATE:  02/14/2022 REFERRING MD:  Dr. Cruzita Lederer, [facility], CHIEF COMPLAINT:  Melena   History of Present Illness:  88 yo female brought to ER due to low Hb and 3 days of melena.  She has hx of AVMs.  She is on eliquis for a fib.  She was diagnosed with COVID 6 days prior to admission.  GI consulted.  On 10/12 she developed rigors with hypoxia and concern for transfusion reaction.  Transfusion stopped and given solumedrol.  Developed hypotension and fever.  Antibiotics started.  She complained of chest pain and troponin elevated.  Pertinent  Medical History  A fib, GI bleed with AVMs, HTN, Hypothyroidism, Anxiety, OA, CKD, CAD, HLD, Renal cell cancer s/p Rt nephrectomy, Pulmonary hypertension, Aortic stenosis, COPD, non ischemic CM, s/p ICD, Celiac and SMA stenoses  Significant Hospital Events: Including procedures, antibiotic start and stop dates in addition to other pertinent events   10/11 Admit, GI consulted 10/12 possible transfusion reaction  Interim History / Subjective:  She is feeling better now.  Not on supplemental oxygen.  Repeat BP 131/72.  HR in 60's.  Currently denies chest pain, cough, sputum, dizziness, nausea.  When asked about possibly needing ICU admission she said that is a scary place and she doesn't think she needs it, and that she doesn't think she would take another blood transfusion.  Objective   Blood pressure (!) 96/48, pulse 62, temperature 99.1 F (37.3 C), temperature source Oral, resp. rate (!) 22, height '5\' 6"'$  (1.676 m), weight 64.4 kg, SpO2 97 %.        Intake/Output Summary (Last 24 hours) at 02/14/2022 1729 Last data filed at 02/14/2022 1506 Gross per 24 hour  Intake 738.48 ml  Output 1000 ml  Net -261.52 ml   Filed Weights   02/13/22 1507  Weight: 64.4 kg    Examination:  General - alert Eyes - pupils reactive ENT - no sinus tenderness,  no stridor Cardiac - irregular Chest - equal breath sounds b/l, no wheezing or rales Abdomen - soft, non tender, + bowel sounds Extremities - no cyanosis, clubbing, or edema Skin - no rashes Neuro - normal strength, moves extremities, follows commands Psych - normal mood and behavior  Resolved Hospital Problem list     Assessment & Plan:   Acute blood loss anemia from GI bleeding. Transfusion reaction. - hematology consulted - pt isn't sure she would agree to additional transfusions  NSTEMI from demand ischemia. Hx of CAD, Chronic diastolic CHF, non-ischemic cardiomyopathy s/p AICD, permanent A fib, Pulmonary venous hypertension, Aortic stenosis, HLD. - followed by CHF team  GI bleeding with hx of AVMs. - GI consulted - continue protonix  Recent COVID infection with possible pneumonia. - started on zosyn  - f/u CXR  CKD 3b with hx of renal cell carcinoma s/p Rt nephrectomy. - f/u BMET  Hx of hypothyroidism. - continue synthroid  She does not require ICU admission at this time.  Please notify PCCM if her status changes, and we can reassess transfer to ICU.  D/w Dr. Cruzita Lederer.    Best Practice (right click and "Reselect all SmartList Selections" daily)   Diet/type: clear liquids DVT prophylaxis: SCD GI prophylaxis: PPI Lines: N/A Foley:  N/A Code Status:  full code Last date of multidisciplinary goals of care discussion '[x]'$   Labs   CBC: Recent Labs  Lab 02/12/22 1655 02/13/22 1625 02/13/22 2106  02/14/22 0245 02/14/22 1133 02/14/22 1330  WBC 2.6* 3.0* 2.8* 3.4* DISREGARD RESULTS.  SPECIMEN DRAWN AT INCORRECT TIME. 4.2  NEUTROABS 1,862 2.3  --   --   --   --   HGB 8.9* 7.8* 7.7* 7.2* DISREGARD RESULTS.  SPECIMEN DRAWN AT INCORRECT TIME PER E. DIXON,RN AT 3790 02/14/2022 BY ZBEECH. 7.8*  HCT 26.7* 23.8* 23.7* 22.7* DISREGARD RESULTS.  SPECIMEN DRAWN AT INCORRECT TIME. 25.0*  MCV 84.0 88.1 88.1 89.0 DISREGARD RESULTS.  SPECIMEN DRAWN AT INCORRECT TIME. 91.6   PLT 144 142* 140* 139* DISREGARD RESULTS.  SPECIMEN DRAWN AT INCORRECT TIME. 145*    Basic Metabolic Panel: Recent Labs  Lab 02/12/22 1655 02/13/22 1625 02/14/22 0245 02/14/22 1241  NA 140 136  --  138  K 4.2 3.4*  --  3.4*  CL 98 100  --  105  CO2 23 27  --  23  GLUCOSE 97 102*  --  89  BUN 85* 68*  --  49*  CREATININE 1.34* 1.32*  --  1.42*  CALCIUM 9.2 8.5*  --  7.9*  MG  --   --  2.1  --   PHOS  --   --  2.5  --    GFR: Estimated Creatinine Clearance: 27.1 mL/min (A) (by C-G formula based on SCr of 1.42 mg/dL (H)). Recent Labs  Lab 02/13/22 1505 02/13/22 1625 02/13/22 2106 02/14/22 0245 02/14/22 1133 02/14/22 1330  WBC  --    < > 2.8* 3.4* DISREGARD RESULTS.  SPECIMEN DRAWN AT INCORRECT TIME. 4.2  LATICACIDVEN 1.2  --   --   --   --   --    < > = values in this interval not displayed.    Liver Function Tests: Recent Labs  Lab 02/12/22 1655 02/13/22 1625 02/14/22 1241  AST '22 21 25  '$ ALT '11 14 12  '$ ALKPHOS  --  36* 35*  BILITOT 0.4 0.6 1.8*  PROT 6.3 5.5* 5.1*  ALBUMIN  --  3.0* 2.6*   No results for input(s): "LIPASE", "AMYLASE" in the last 168 hours. No results for input(s): "AMMONIA" in the last 168 hours.  ABG    Component Value Date/Time   PHART 7.469 (H) 11/06/2018 0355   PCO2ART 46.5 11/06/2018 0355   PO2ART 68.3 (L) 11/06/2018 0355   HCO3 31.6 (H) 03/08/2019 0933   TCO2 33 (H) 03/08/2019 0933   O2SAT 59.0 03/08/2019 0933     Coagulation Profile: Recent Labs  Lab 02/13/22 1625  INR 1.2    Cardiac Enzymes: No results for input(s): "CKTOTAL", "CKMB", "CKMBINDEX", "TROPONINI" in the last 168 hours.  HbA1C: Hgb A1c MFr Bld  Date/Time Value Ref Range Status  02/09/2021 02:07 AM 4.6 (L) 4.8 - 5.6 % Final    Comment:    (NOTE) Pre diabetes:          5.7%-6.4%  Diabetes:              >6.4%  Glycemic control for   <7.0% adults with diabetes   08/17/2014 05:00 AM 6.1 (H) 4.8 - 5.6 % Final    Comment:    (NOTE)          Pre-diabetes: 5.7 - 6.4         Diabetes: >6.4         Glycemic control for adults with diabetes: <7.0     CBG: No results for input(s): "GLUCAP" in the last 168 hours.  Review of Systems:   Reviewed and  negative  Past Medical History:  She,  has a past medical history of Actinic keratosis (01/17/2015), Adjustment disorder with anxiety, Anemia, Anginal pain ([place]), Arthritis, Atrial fibrillation ([place]), Atypical mole (03/25/2018), Automatic implantable cardioverter-defibrillator in situ, Avascular necrosis of hip ([place]) (05/03/2011), Blood in stool, Carotid artery stenosis (09/2007), Chest pain, unspecified, Chronic airway obstruction, not elsewhere classified, Chronic bronchitis ([place]), Chronic diastolic CHF (congestive heart failure) ([place]) (06/04/2013), Chronic kidney disease, unspecified, Coronary artery disease, Displaced fracture of left femoral neck ([place]) (10/09/2018), Frequent UTI, GI bleed (03/28/2020), Hemorrhage of rectum and anus, High cholesterol, History of blood transfusion (04/2011), renal cell cancer, Hypertension (05/20/2011), Hypertr obst cardiomyop, Hypotension, unspecified, Long term (current) use of anticoagulants, Malignant neoplasm of kidney, except pelvis, Osteoarthritis of right hip, Other and unspecified coagulation defects, PONV (postoperative nausea and vomiting), Presence of permanent cardiac pacemaker, RECTAL BLEEDING (10/13/2009), Renal cancer ([place]) (06/2001), Secondary cardiomyopathy, unspecified, Sinus bradycardia, Squamous cell carcinoma of skin (01/17/2015), Squamous cell carcinoma of skin (01/29/2018), Squamous cell carcinoma of skin (03/25/2018), Squamous cell carcinoma of skin (11/09/2020), Squamous cell carcinoma of skin (12/19/2021), Squamous cell carcinoma of skin (12/19/2021), Squamous cell carcinoma of skin (01/21/2022), Squamous cell carcinoma of skin (01/21/2022), Squamous cell carcinoma of skin (01/21/2022), and Urge incontinence.   Surgical History:   Past  Surgical History:  Procedure Laterality Date   ABDOMINAL AORTOGRAM W/LOWER EXTREMITY N/A 02/12/2021   Procedure: ABDOMINAL AORTOGRAM W/LOWER EXTREMITY;  Surgeon: Waynetta Sandy, MD;  Location: [facility];  Service: Cardiovascular;  Laterality: N/A;   ABDOMINAL HYSTERECTOMY  1975   for benign causes   APPENDECTOMY     BI-VENTRICULAR PACEMAKER UPGRADE  05/04/2010   BIOPSY  02/28/2019   Procedure: BIOPSY;  Surgeon: Milus Banister, MD;  Location[facility] ENDOSCOPY;  Service: Endoscopy;;   CARDIAC CATHETERIZATION  2006   CARDIOVERSION N/A 02/20/2018   Procedure: CARDIOVERSION;  Surgeon: Minna Merritts, MD;  Location: [facility];  Service: Cardiovascular;  Laterality: N/A;   CARDIOVERSION N/A 03/27/2018   Procedure: CARDIOVERSION;  Surgeon: Minna Merritts, MD;  Location: [facility];  Service: Cardiovascular;  Laterality: N/A;   CATARACT EXTRACTION W/ INTRAOCULAR LENS  IMPLANT, BILATERAL  01/2006-02-2006   CHOLECYSTECTOMY N/A 11/11/2012   Procedure: LAPAROSCOPIC CHOLECYSTECTOMY WITH INTRAOPERATIVE CHOLANGIOGRAM;  Surgeon: Imogene Burn. Georgette Dover, MD;  Location: [place];  Service: General;  Laterality: N/A;   COLONOSCOPY WITH PROPOFOL N/A 02/28/2019   Procedure: COLONOSCOPY WITH PROPOFOL;  Surgeon: Milus Banister, MD;  Location: [facility] ENDOSCOPY;  Service: Endoscopy;  Laterality: N/A;   ENTEROSCOPY N/A 03/30/2020   Procedure: ENTEROSCOPY;  Surgeon: Lavena Bullion, DO;  Location: [place];  Service: Gastroenterology;  Laterality: N/A;   ENTEROSCOPY N/A 02/10/2021   Procedure: ENTEROSCOPY;  Surgeon: Carol Ada, MD;  Location: [place];  Service: Endoscopy;  Laterality: N/A;   EP IMPLANTABLE DEVICE N/A 02/21/2016   Procedure: ICD Generator Changeout;  Surgeon: Deboraha Sprang, MD;  Location: [facility];  Service: Cardiovascular;  Laterality: N/A;   ESOPHAGOGASTRODUODENOSCOPY (EGD) WITH PROPOFOL N/A 02/28/2019   Procedure: ESOPHAGOGASTRODUODENOSCOPY (EGD) WITH PROPOFOL;   Surgeon: Milus Banister, MD;  Location: [facility] ENDOSCOPY;  Service: Endoscopy;  Laterality: N/A;   GIVENS CAPSULE STUDY N/A 03/15/2019   Procedure: GIVENS CAPSULE STUDY;  Surgeon: Thornton Park, MD;  Location: [place];  Service: Gastroenterology;  Laterality: N/A;   HOT HEMOSTASIS N/A 03/30/2020   Procedure: HOT HEMOSTASIS (ARGON PLASMA COAGULATION/BICAP);  Surgeon: Lavena Bullion, DO;  Location: [facility] ENDOSCOPY;  Service: Gastroenterology;  Laterality: N/A;  HOT HEMOSTASIS N/A 02/10/2021   Procedure: HOT HEMOSTASIS (ARGON PLASMA COAGULATION/BICAP);  Surgeon: Carol Ada, MD;  Location: [place];  Service: Endoscopy;  Laterality: N/A;   INSERT / REPLACE / REMOVE PACEMAKER  05-01-11   02-28-05-/05-04-10-ICD-MEDTRONIC MAXIMAL DR   JOINT REPLACEMENT     LAPAROSCOPIC CHOLECYSTECTOMY  11/11/2012   LAPAROSCOPIC LYSIS OF ADHESIONS N/A 11/11/2012   Procedure: LAPAROSCOPIC LYSIS OF ADHESIONS;  Surgeon: Imogene Burn. Georgette Dover, MD;  Location: [place];  Service: General;  Laterality: N/A;   NEPHRECTOMY Right 06/2001    S/P RENAL CELL CANCER   PERIPHERAL VASCULAR INTERVENTION Bilateral 02/12/2021   Procedure: PERIPHERAL VASCULAR INTERVENTION;  Surgeon: Waynetta Sandy, MD;  Location: [facility];  Service: Cardiovascular;  Laterality: Bilateral;  Iliac artery stents   PRESSURE SENSOR/CARDIOMEMS N/A 02/03/2019   Procedure: PRESSURE SENSOR/CARDIOMEMS;  Surgeon: Larey Dresser, MD;  Location: [facility] [facility];  Service: Cardiovascular;  Laterality: N/A;   RIGHT HEART CATH N/A 11/09/2018   Procedure: RIGHT HEART CATH;  Surgeon: Larey Dresser, MD;  Location: [facility];  Service: Cardiovascular;  Laterality: N/A;   RIGHT HEART CATH N/A 03/08/2019   Procedure: RIGHT HEART CATH;  Surgeon: Larey Dresser, MD;  Location: [facility];  Service: Cardiovascular;  Laterality: N/A;   SUBMUCOSAL TATTOO INJECTION  02/28/2019   Procedure: SUBMUCOSAL TATTOO INJECTION;  Surgeon: Milus Banister, MD;  Location: [facility] ENDOSCOPY;  Service: Endoscopy;;   TOTAL HIP ARTHROPLASTY Right 05/03/2011   Procedure: TOTAL HIP ARTHROPLASTY ANTERIOR APPROACH;  Surgeon: Mcarthur Rossetti;  Location: [facility];  Service: Orthopedics;  Laterality: Right;  Removal of Cannulated Screws Right Hip, Right Direct Anterior Hip Replacement   TOTAL HIP ARTHROPLASTY Left 10/09/2018   Procedure: TOTAL HIP ARTHROPLASTY ANTERIOR APPROACH;  Surgeon: Rod Can, MD;  Location: [place];  Service: Orthopedics;  Laterality: Left;     Social History:   reports that she quit smoking about 20 years ago. Her smoking use included cigarettes. She has a 20.00 pack-year smoking history. She has never used smokeless tobacco. She reports that she does not drink alcohol and does not use drugs.   Family History:  Her family history includes Breast cancer in her cousin and another family member; Breast cancer (age of onset: 43) in her maternal aunt; Early death in her father; Heart failure in her mother. There is no history of Colon cancer, Esophageal cancer, Liver disease, Pancreatic cancer, or Rectal cancer.   Allergies Allergies  Allergen Reactions   Dilaudid [Hydromorphone] Other (See Comments)    Excessive Somnolence- Required Narcan   Fentanyl Other (See Comments)    Required Narcan when given with Dilaudid. Tolerated single dose when given during procedure 02/03/2019.    Codeine Nausea And Vomiting    Hallucinations    Morphine And Related Nausea And Vomiting    Hallucinations    Hydrocodone Nausea Only and Other (See Comments)    Nausea and lethargy   Oxycodone Nausea Only and Other (See Comments)    Nausea and lethargy   Tramadol Nausea Only and Other (See Comments)    Nausea and lethargy   Whole Blood Itching and Nausea And Vomiting    Pt and family stated pt will receive blood products but had a reaction requiring benadryl on a previous administration.    Sulfonamide Derivatives Other (See Comments)     Dry mouth     Home Medications  Prior to Admission medications   Medication Sig Start Date  End Date Taking? Authorizing Provider  acetaminophen (TYLENOL) 500 MG tablet Take 500-1,000 mg by mouth every 8 (eight) hours as needed for mild pain or headache.   Yes [provider]  albuterol (VENTOLIN HFA) 108 (90 Base) MCG/ACT inhaler Inhale 2 puffs into the lungs every 6 (six) hours as needed for wheezing or shortness of breath. 02/08/22  Yes Waunita Schooner, MD  Cyanocobalamin (B-12 PO) Take 1 tablet by mouth daily.    Yes [provider]  ELIQUIS 2.5 MG TABS tablet TAKE 1 TABLET BY MOUTH TWICE A DAY Patient taking differently: Take 2.5 mg by mouth 2 (two) times daily. 06/11/21  Yes Larey Dresser, MD  fluticasone [facility]) 50 MCG/ACT nasal spray Place 2 sprays into both nostrils daily as needed for allergies or rhinitis. 03/08/21  Yes Waunita Schooner, MD  metolazone (ZAROXOLYN) 2.5 MG tablet Take 1 tablet (2.5 mg total) by mouth as directed. Take every other Thursday with your morning dose of Torsemide Patient taking differently: Take 2.5 mg by mouth once a week. Take Thursday with your morning dose of Torsemide 08/08/21  Yes [place], [place], FNP  potassium chloride SA (KLOR-CON M) 20 MEQ tablet TAKE 2 TABLETS (40 MEQ) BY MOUTH 2 TIMESDAILY. TAKE 1 EXTRA TABLET ON THE DAYS YOU TAKE METOLAZONE. Patient taking differently: Take 40-60 mEq by mouth 2 (two) times daily. Take 1 extra tablet (20 mEq) on the days you take metolazone. 10/04/21  Yes Larey Dresser, MD  vitamin C (ASCORBIC ACID) 250 MG tablet Take 500 mg by mouth daily.   Yes [provider]  VITAMIN D PO Take 4,000 Units by mouth daily.   Yes [provider]  clobetasol cream (TEMOVATE) 0.05 % Apply to affected area left foot twice a day until improved. Avoid face, groin, axilla. Patient not taking: Reported on 02/13/2022 12/19/21   Brendolyn Patty, MD  doxycycline (ADOXA) 100 MG tablet Take 1 tablet (100 mg  total) by mouth 2 (two) times daily for 10 days. Take with food Patient not taking: Reported on 02/12/2022 02/04/22 02/14/22  Brendolyn Patty, MD  octreotide (SANDOSTATIN LAR) 10 MG injection Inject 10 mg into the muscle every 28 (twenty-eight) days. Patient not taking: Reported on 02/13/2022 12/26/21   Cirigliano, Vito V, DO  torsemide (DEMADEX) 100 MG tablet TAKE 1 TABLET BY MOUTH EVERY MORNING (EVENING DOSE 80 MG) Patient taking differently: Take 80-100 mg by mouth See admin instructions. Take '100mg'$  by mouth every morning and 80 every evening. 01/03/22   Larey Dresser, MD     Signature:  Chesley Mires, MD [place] Pager - [phone] 02/14/2022, 6:16 PM

## 2022-02-14 NOTE — ED Notes (Signed)
MD Gherghe notified of troponin increase from 343 -> 1202

## 2022-02-14 NOTE — ED Notes (Addendum)
Received call from EDT about pt having a reaction to her blood transfusion. Per report pt started having chills and shaking. Pt now complaining of CP. Hospitalist was paged and is on his way to see pt. Blood was stopped by neighboring ED RN. NS started at 179m/hr

## 2022-02-14 NOTE — Progress Notes (Signed)
Paged stat around lunchtime that the patient is experiencing rigors, and worsening hypoxia, highly suspicious for transfusion reaction as she was getting a unit of blood at that point.  She has a history of a transfusion reaction back a few years ago in 2021.  Arrived at bedside, patient with significant rigors, hypoxic requiring 10 L and having purple lips.  She did receive premedication with Benadryl as well as Tylenol before the transfusion.  She was placed on oxygen, IV fluids, and received a dose of Solu-Medrol.  Blood transfusion was stopped and blood bank notified.  Case was briefly discussed also with Dr. Alvy Bimler with hematology.  Following initial interventions she felt a little bit better, however on reevaluation about 60 minutes later she was febrile to 102 and started to become hypotensive.  She was placed on antibiotics as well as IV fluids.  During the initial episode the patient was complaining of chest pain.  Initial troponin was elevated to 343.  Repeat troponin 2 hours later was up to 1202.  I discussed the case with cardiology on-call, Dr. Audie Box.  Patient is established in the CHF clinic mainly for RV failure. Her most recent 2D echo done in February of this year showed an EF of 65-70%, with RV pressure/volume overload.  RV was reduced, and there was severely elevated pulmonary artery systolic pressure at 86.1 mmHg.  This is most likely the etiology for her troponin leak.  We will obtain a 2D echo to update LVEF and RV.  Avoid further boluses, if she remains hypotensive likely needs to go to the ICU for peripheral pressors.  Continue to closely monitor.   The patient is critically ill with multiple organ system failure and requires high complexity decision making for assessment and support, frequent evaluation and titration of therapies, advanced monitoring, review of radiographic studies and interpretation of complex data.    Critical Care Time devoted to patient care services, exclusive of  separately billable procedures, described in this note is 40 minute, she was evaluated at bedside on 4 different occasions between 11:30 AM and 5 PM.  [name] M. Cruzita Lederer, MD, PhD [facility]  Between 7 am - 7 pm you can contact me via Amion (for emergencies) or [place] (non urgent matters).  I am not available 7 pm - 7 am, please contact night coverage MD/APP via Amion

## 2022-02-14 NOTE — ED Notes (Signed)
MD at bedside. 

## 2022-02-14 NOTE — H&P (View-Only) (Signed)
Consultation Note   Referring Provider: [facility] PCP: [name] Bush, MD Primary Gastroenterologist: [name] Heck, MD Reason for consultation: [facility] Day: 2  Assessment / Plan   # 88 yo female with history of recurrent GI bleeding / IDA secondary to gastric and small bowel AVMS. In ED with recurrent melena over last few days with concurrent drop in hgb. Takes Eliquis, last dose was Tuesday.  Hgb is 7.2, down from 11 in July. Getting blood transfusion now.  She will need small bowel enteroscopy.  The risks and benefits of small bowel enteroscopy were discussed with the patient who agrees to proceed. Timing of procedure to be announced. Keep NPO PPI infusion going. No active bleeding / BMs yesterday or today but will keep infusion going until workup complete.  If enteroscopy negative then will need to reevaluate ileal ulcers seen on VCE in July.  Apparently our office has been working on trying to get Octreotide injections approved by insurance  # COVID, diagnosed 7 days ago. Symptoms improved.   # Afib on Eliquis. Last dose was Tuesday  # CKD stage 3   # See PMH for additional medical problems   HPI   [name] is a 88 y.o. female with a past medical history significant for history of A-fib (on Eliquis),  ischemic colitis (2020), cardiomyopathy s/p ICD, chronic diastolic CHF, COPD, hypothyroidism, TIA, CKD 3, renal cell cancer s/p right nephrectomy, peripheral artery disease , cholecystectomy, diverticulosis, small bowel and gastric AVMs, distal small bowel ulcers, ischemic colitis.   See PMH for any additional medical problems.  [name] was last seen in our office 12/26/21 for follow up on IDA. She has had previous admissions for anemia/GI bleed secondary to small bowel and gastric AVMs. Also July 2023 VCE findings of non-bleeding ileal ulcers likely not in reach of push enteroscopy She has been treated  with IV iron and oral iron . Oral iron stopped several weeks ago.  At time of her last visit with [place] the plan was to try and get approval for octreotide LAR. Also consider CT enterography in the future if needing to work-up small, nonbleeding ileal ulcers  Interval History:  Patient presented to ED yesterday for evaluation of melena.  Significant findings:  VSS    Hgb 8.9 >> down to 7.2 today (baseline 11) Platelets 139 Cr 1.32 CXR >> small bil effusions FOBT positive  [name] hasn't been on oral iron for several weeks. A few days ago she began have dark , loose stools again. She has been lightheaded. No abdominal pain, N/V. Last dose of Eliquis was Monday or Tuesday.  No BMs in 24 hours. She is NOT on Sandostatin. She hasn't heard anything from our office as to whether it was approved by insurance.   Previous GI Evaluation     Most recent endoscopies  Oct 2020 EGD mild gastritis.  Colonoscopy with ischemic colitis.  VCE with small bowel AVMs in duodenum and small streak of blood in small bowel.    Nov 2021 push enteroscopy  Push enteroscopy with 1 duodenal AVM, 2 jejunal AVMs (1 actively bleeding) treated with APC  Oct 2022 push enteroscopy  Push enteroscopy with a few gastric AVMs treated with APC, for  duodenal bulb/D2 AVMs (actively bleeding) treated with APC, single proximal jejunal AVM treated with APC.  11/14/2021: VCE: 1 tiny nonbleeding AVM in proximal duodenum, 1 small nonbleeding AVM in proximal small bowel, 3-4 nonbleeding ulcers in mid to distal small bowel, likely beyond reach of single balloon enteroscopy  Recent Labs and Imaging DG Chest 2 View  Result Date: 02/13/2022 CLINICAL DATA:  Post COVID low hemoglobin fatigue EXAM: CHEST - 2 VIEW COMPARISON:  03/08/2021 FINDINGS: Left-sided pacing device as before. Small bilateral pleural effusions. Stable cardiomediastinal silhouette with aortic atherosclerosis. No consolidation or pneumothorax. Chronic compression fracture of  the upper lumbar spine. Stable sclerotic focus in the proximal left humerus. IMPRESSION: Small bilateral pleural effusions Electronically Signed   By: Donavan Foil M.D.   On: 02/13/2022 19:23    Labs:  Recent Labs    02/13/22 1625 02/13/22 2106 02/14/22 0245  WBC 3.0* 2.8* 3.4*  HGB 7.8* 7.7* 7.2*  HCT 23.8* 23.7* 22.7*  PLT 142* 140* 139*   Recent Labs    02/12/22 1655 02/13/22 1625  NA 140 136  K 4.2 3.4*  CL 98 100  CO2 23 27  GLUCOSE 97 102*  BUN 85* 68*  CREATININE 1.34* 1.32*  CALCIUM 9.2 8.5*   Recent Labs    02/13/22 1625  PROT 5.5*  ALBUMIN 3.0*  AST 21  ALT 14  ALKPHOS 36*  BILITOT 0.6   No results for input(s): "HEPBSAG", "HCVAB", "HEPAIGM", "HEPBIGM" in the last 72 hours. Recent Labs    02/13/22 1625  LABPROT 15.5*  INR 1.2    Past Medical History:  Diagnosis Date   Actinic keratosis 01/17/2015   R forearm   Adjustment disorder with anxiety    Anemia    Anginal pain ([place])    Arthritis    "some in my hands" (11/11/2012)   Atrial fibrillation (HCC)    Atypical mole 03/25/2018   L forearm - severe   Automatic implantable cardioverter-defibrillator in situ    Avascular necrosis of hip ([place]) 05/03/2011   Blood in stool    Carotid artery stenosis 09/2007   a. 09/2007: 60-79% bilateral (stable); b. 10/2008: 40-59% R 60-79%    Chest pain, unspecified    Chronic airway obstruction, not elsewhere classified    Chronic bronchitis ([place])    "get it some; not q year" (11/11/2012)   Chronic diastolic CHF (congestive heart failure) ([place]) 06/04/2013   Chronic kidney disease, unspecified    Coronary artery disease    non-obstructive by 2006 cath   Displaced fracture of left femoral neck ([place]) 10/09/2018   Frequent UTI    "get them a couple times/yr" (11/11/2012)   GI bleed 03/28/2020   Hemorrhage of rectum and anus    High cholesterol    History of blood transfusion 04/2011   "after hip OR" (11/11/2012)   Hx of renal cell cancer    Hypertension  05/20/2011   Hypertr obst cardiomyop    Hypotension, unspecified    cardiac cath 2006..nonobstructive CAD 30-40s lesions.Marland KitchenETT 1/09 nondiagnostic due to poor HR response..Right Renal Cancer 2003   Long term (current) use of anticoagulants    Malignant neoplasm of kidney, except pelvis    Osteoarthritis of right hip    Other and unspecified coagulation defects    PONV (postoperative nausea and vomiting)    Presence of permanent cardiac pacemaker    RECTAL BLEEDING 10/13/2009   Qualifier: Diagnosis of  By: Chester Holstein NP, Shawndale Kilpatrick     Renal cancer [facility]) 06/2001  Right   Secondary cardiomyopathy, unspecified    Sinus bradycardia    Squamous cell carcinoma of skin 01/17/2015   R lat wrist   Squamous cell carcinoma of skin 01/29/2018   R post upper leg - superficially invasive   Squamous cell carcinoma of skin 03/25/2018   L lat foot   Squamous cell carcinoma of skin 11/09/2020   left lat foot - EDC 01/01/21, recurrent 01/31/21 - MOHs 02/22/21   Squamous cell carcinoma of skin 12/19/2021   Right Posterior Medial Thigh, EDC   Squamous cell carcinoma of skin 12/19/2021   SCC IS, L lat heel, EDC 02/04/2022   Squamous cell carcinoma of skin 01/21/2022   L forearm, EDC 02/04/2022   Squamous cell carcinoma of skin 01/21/2022   R lower leg below knee, EDC 02/04/2022   Squamous cell carcinoma of skin 01/21/2022   SCCIS, R post heel, EDC 02/04/2022   Urge incontinence     Past Surgical History:  Procedure Laterality Date   ABDOMINAL AORTOGRAM W/LOWER EXTREMITY N/A 02/12/2021   Procedure: ABDOMINAL AORTOGRAM W/LOWER EXTREMITY;  Surgeon: Waynetta Sandy, MD;  Location: [place] CV LAB;  Service: Cardiovascular;  Laterality: N/A;   ABDOMINAL HYSTERECTOMY  1975   for benign causes   APPENDECTOMY     BI-VENTRICULAR PACEMAKER UPGRADE  05/04/2010   BIOPSY  02/28/2019   Procedure: BIOPSY;  Surgeon: Milus Banister, MD;  Location: [facility] ENDOSCOPY;  Service: Endoscopy;;   CARDIAC  CATHETERIZATION  2006   CARDIOVERSION N/A 02/20/2018   Procedure: CARDIOVERSION;  Surgeon: Minna Merritts, MD;  Location: [place] ORS;  Service: Cardiovascular;  Laterality: N/A;   CARDIOVERSION N/A 03/27/2018   Procedure: CARDIOVERSION;  Surgeon: Minna Merritts, MD;  Location: [facility] ORS;  Service: Cardiovascular;  Laterality: N/A;   CATARACT EXTRACTION W/ INTRAOCULAR LENS  IMPLANT, BILATERAL  01/2006-02-2006   CHOLECYSTECTOMY N/A 11/11/2012   Procedure: LAPAROSCOPIC CHOLECYSTECTOMY WITH INTRAOPERATIVE CHOLANGIOGRAM;  Surgeon: Imogene Burn. Georgette Dover, MD;  Location: [place];  Service: General;  Laterality: N/A;   COLONOSCOPY WITH PROPOFOL N/A 02/28/2019   Procedure: COLONOSCOPY WITH PROPOFOL;  Surgeon: Milus Banister, MD;  Location: [facility] ENDOSCOPY;  Service: Endoscopy;  Laterality: N/A;   ENTEROSCOPY N/A 03/30/2020   Procedure: ENTEROSCOPY;  Surgeon: Lavena Bullion, DO;  Location: [place];  Service: Gastroenterology;  Laterality: N/A;   ENTEROSCOPY N/A 02/10/2021   Procedure: ENTEROSCOPY;  Surgeon: Carol Ada, MD;  Location: [place];  Service: Endoscopy;  Laterality: N/A;   EP IMPLANTABLE DEVICE N/A 02/21/2016   Procedure: ICD Generator Changeout;  Surgeon: Deboraha Sprang, MD;  Location: [place] CV LAB;  Service: Cardiovascular;  Laterality: N/A;   ESOPHAGOGASTRODUODENOSCOPY (EGD) WITH PROPOFOL N/A 02/28/2019   Procedure: ESOPHAGOGASTRODUODENOSCOPY (EGD) WITH PROPOFOL;  Surgeon: Milus Banister, MD;  Location: [facility] ENDOSCOPY;  Service: Endoscopy;  Laterality: N/A;   GIVENS CAPSULE STUDY N/A 03/15/2019   Procedure: GIVENS CAPSULE STUDY;  Surgeon: Thornton Park, MD;  Location: [place];  Service: Gastroenterology;  Laterality: N/A;   HOT HEMOSTASIS N/A 03/30/2020   Procedure: HOT HEMOSTASIS (ARGON PLASMA COAGULATION/BICAP);  Surgeon: Lavena Bullion, DO;  Location: [facility] ENDOSCOPY;  Service: Gastroenterology;  Laterality: N/A;   HOT HEMOSTASIS N/A 02/10/2021   Procedure: HOT  HEMOSTASIS (ARGON PLASMA COAGULATION/BICAP);  Surgeon: Carol Ada, MD;  Location: [place];  Service: Endoscopy;  Laterality: N/A;   INSERT / REPLACE / REMOVE PACEMAKER  05-01-11   02-28-05-/05-04-10-ICD-MEDTRONIC MAXIMAL DR   JOINT REPLACEMENT     LAPAROSCOPIC CHOLECYSTECTOMY  11/11/2012   LAPAROSCOPIC LYSIS OF ADHESIONS N/A 11/11/2012   Procedure: LAPAROSCOPIC LYSIS OF ADHESIONS;  Surgeon: Imogene Burn. Georgette Dover, MD;  Location: [place];  Service: General;  Laterality: N/A;   NEPHRECTOMY Right 06/2001    S/P RENAL CELL CANCER   PERIPHERAL VASCULAR INTERVENTION Bilateral 02/12/2021   Procedure: PERIPHERAL VASCULAR INTERVENTION;  Surgeon: Waynetta Sandy, MD;  Location: [place] CV LAB;  Service: Cardiovascular;  Laterality: Bilateral;  Iliac artery stents   PRESSURE SENSOR/CARDIOMEMS N/A 02/03/2019   Procedure: PRESSURE SENSOR/CARDIOMEMS;  Surgeon: Larey Dresser, MD;  Location: [place] CV LAB;  Service: Cardiovascular;  Laterality: N/A;   RIGHT HEART CATH N/A 11/09/2018   Procedure: RIGHT HEART CATH;  Surgeon: Larey Dresser, MD;  Location: [place] CV LAB;  Service: Cardiovascular;  Laterality: N/A;   RIGHT HEART CATH N/A 03/08/2019   Procedure: RIGHT HEART CATH;  Surgeon: Larey Dresser, MD;  Location: [place] CV LAB;  Service: Cardiovascular;  Laterality: N/A;   SUBMUCOSAL TATTOO INJECTION  02/28/2019   Procedure: SUBMUCOSAL TATTOO INJECTION;  Surgeon: Milus Banister, MD;  Location: [facility] ENDOSCOPY;  Service: Endoscopy;;   TOTAL HIP ARTHROPLASTY Right 05/03/2011   Procedure: TOTAL HIP ARTHROPLASTY ANTERIOR APPROACH;  Surgeon: Mcarthur Rossetti;  Location: [facility] ORS;  Service: Orthopedics;  Laterality: Right;  Removal of Cannulated Screws Right Hip, Right Direct Anterior Hip Replacement   TOTAL HIP ARTHROPLASTY Left 10/09/2018   Procedure: TOTAL HIP ARTHROPLASTY ANTERIOR APPROACH;  Surgeon: Rod Can, MD;  Location: [place];  Service: Orthopedics;  Laterality: Left;     Family History  Problem Relation Age of Onset   Heart failure Mother    Early death Father        car accident   Breast cancer Maternal Aunt 82   Breast cancer Cousin    Breast cancer Other    Colon cancer Neg Hx    Esophageal cancer Neg Hx    Liver disease Neg Hx    Pancreatic cancer Neg Hx    Rectal cancer Neg Hx     Prior to Admission medications   Medication Sig Start Date End Date Taking? Authorizing Provider  acetaminophen (TYLENOL) 500 MG tablet Take 500-1,000 mg by mouth every 8 (eight) hours as needed for mild pain or headache.   Yes [provider]  albuterol (VENTOLIN HFA) 108 (90 Base) MCG/ACT inhaler Inhale 2 puffs into the lungs every 6 (six) hours as needed for wheezing or shortness of breath. 02/08/22  Yes Waunita Schooner, MD  Cyanocobalamin (B-12 PO) Take 1 tablet by mouth daily.    Yes [provider]  ELIQUIS 2.5 MG TABS tablet TAKE 1 TABLET BY MOUTH TWICE A DAY Patient taking differently: Take 2.5 mg by mouth 2 (two) times daily. 06/11/21  Yes Larey Dresser, MD  fluticasone [facility]) 50 MCG/ACT nasal spray Place 2 sprays into both nostrils daily as needed for allergies or rhinitis. 03/08/21  Yes Waunita Schooner, MD  metolazone (ZAROXOLYN) 2.5 MG tablet Take 1 tablet (2.5 mg total) by mouth as directed. Take every other Thursday with your morning dose of Torsemide Patient taking differently: Take 2.5 mg by mouth once a week. Take Thursday with your morning dose of Torsemide 08/08/21  Yes [place], [place], FNP  potassium chloride SA (KLOR-CON M) 20 MEQ tablet TAKE 2 TABLETS (40 MEQ) BY MOUTH 2 TIMESDAILY. TAKE 1 EXTRA TABLET ON THE DAYS YOU TAKE METOLAZONE. Patient taking differently: Take 40-60 mEq by mouth 2 (two) times  daily. Take 1 extra tablet (20 mEq) on the days you take metolazone. 10/04/21  Yes Larey Dresser, MD  vitamin C (ASCORBIC ACID) 250 MG tablet Take 500 mg by mouth daily.   Yes [provider]  VITAMIN D PO Take 4,000 Units  by mouth daily.   Yes [provider]  clobetasol cream (TEMOVATE) 0.05 % Apply to affected area left foot twice a day until improved. Avoid face, groin, axilla. Patient not taking: Reported on 02/13/2022 12/19/21   Brendolyn Patty, MD  doxycycline (ADOXA) 100 MG tablet Take 1 tablet (100 mg total) by mouth 2 (two) times daily for 10 days. Take with food Patient not taking: Reported on 02/12/2022 02/04/22 02/14/22  Brendolyn Patty, MD  octreotide (SANDOSTATIN LAR) 10 MG injection Inject 10 mg into the muscle every 28 (twenty-eight) days. Patient not taking: Reported on 02/13/2022 12/26/21   Cirigliano, Vito V, DO  torsemide (DEMADEX) 100 MG tablet TAKE 1 TABLET BY MOUTH EVERY MORNING (EVENING DOSE 80 MG) Patient taking differently: Take 80-100 mg by mouth See admin instructions. Take '100mg'$  by mouth every morning and 80 every evening. 01/03/22   Larey Dresser, MD    Current Facility-Administered Medications  Medication Dose Route Frequency Provider Last Rate Last Admin   0.9 %  sodium chloride infusion (Manually program via Guardrails IV Fluids)   Intravenous Once Caren Griffins, MD   Held at 02/14/22 0825   acetaminophen (TYLENOL) tablet 650 mg  650 mg Oral Q6H PRN Toy Baker, MD       Or   acetaminophen (TYLENOL) suppository 650 mg  650 mg Rectal Q6H PRN Doutova, Anastassia, MD       albuterol (PROVENTIL) (2.5 MG/3ML) 0.083% nebulizer solution 2.5 mg  2.5 mg Nebulization Q2H PRN Doutova, Anastassia, MD       ezetimibe (ZETIA) tablet 10 mg  10 mg Oral Daily Doutova, Anastassia, MD   10 mg at 02/13/22 2056   guaiFENesin (MUCINEX) 12 hr tablet 600 mg  600 mg Oral BID Doutova, Nyoka Lint, MD       levothyroxine (SYNTHROID) tablet 25 mcg  25 mcg Oral Q0600 Toy Baker, MD   25 mcg at 02/14/22 0649   ondansetron (ZOFRAN) injection 4 mg  4 mg Intravenous Q6H PRN Doutova, Anastassia, MD   4 mg at 02/14/22 0152   [START ON 02/17/2022] pantoprazole (PROTONIX) injection 40 mg  40  mg Intravenous Q12H Doutova, Anastassia, MD       pantoprazole (PROTONIX) injection 40 mg  40 mg Intravenous Once Doutova, Anastassia, MD       pantoprozole (PROTONIX) 80 mg /NS 100 mL infusion  8 mg/hr Intravenous Continuous Doutova, Anastassia, MD 10 mL/hr at 02/14/22 0725 8 mg/hr at 02/14/22 0725   rosuvastatin (CRESTOR) tablet 40 mg  40 mg Oral Daily Doutova, Anastassia, MD   40 mg at 02/13/22 2104   Current Outpatient Medications  Medication Sig Dispense Refill   acetaminophen (TYLENOL) 500 MG tablet Take 500-1,000 mg by mouth every 8 (eight) hours as needed for mild pain or headache.     albuterol (VENTOLIN HFA) 108 (90 Base) MCG/ACT inhaler Inhale 2 puffs into the lungs every 6 (six) hours as needed for wheezing or shortness of breath. 8 g 2   Cyanocobalamin (B-12 PO) Take 1 tablet by mouth daily.      ELIQUIS 2.5 MG TABS tablet TAKE 1 TABLET BY MOUTH TWICE A DAY (Patient taking differently: Take 2.5 mg by mouth 2 (two) times daily.) 60  tablet 11   fluticasone (FLONASE) 50 MCG/ACT nasal spray Place 2 sprays into both nostrils daily as needed for allergies or rhinitis. 16 g 6   metolazone (ZAROXOLYN) 2.5 MG tablet Take 1 tablet (2.5 mg total) by mouth as directed. Take every other Thursday with your morning dose of Torsemide (Patient taking differently: Take 2.5 mg by mouth once a week. Take Thursday with your morning dose of Torsemide) 60 tablet 2   potassium chloride SA (KLOR-CON M) 20 MEQ tablet TAKE 2 TABLETS (40 MEQ) BY MOUTH 2 TIMESDAILY. TAKE 1 EXTRA TABLET ON THE DAYS YOU TAKE METOLAZONE. (Patient taking differently: Take 40-60 mEq by mouth 2 (two) times daily. Take 1 extra tablet (20 mEq) on the days you take metolazone.) 360 tablet 3   vitamin C (ASCORBIC ACID) 250 MG tablet Take 500 mg by mouth daily.     VITAMIN D PO Take 4,000 Units by mouth daily.     clobetasol cream (TEMOVATE) 0.05 % Apply to affected area left foot twice a day until improved. Avoid face, groin, axilla. (Patient  not taking: Reported on 02/13/2022) 30 g 0   doxycycline (ADOXA) 100 MG tablet Take 1 tablet (100 mg total) by mouth 2 (two) times daily for 10 days. Take with food (Patient not taking: Reported on 02/12/2022) 20 tablet 0   octreotide (SANDOSTATIN LAR) 10 MG injection Inject 10 mg into the muscle every 28 (twenty-eight) days. (Patient not taking: Reported on 02/13/2022) 1 each 5   torsemide (DEMADEX) 100 MG tablet TAKE 1 TABLET BY MOUTH EVERY MORNING (EVENING DOSE 80 MG) (Patient taking differently: Take 80-100 mg by mouth See admin instructions. Take '100mg'$  by mouth every morning and 80 every evening.) 30 tablet 11    Allergies as of 02/13/2022 - Review Complete 02/13/2022  Allergen Reaction Noted   Dilaudid [hydromorphone] Other (See Comments) 11/06/2018   Fentanyl Other (See Comments) 02/05/2019   Codeine Nausea And Vomiting    Morphine and related Nausea And Vomiting 11/04/2012   Hydrocodone Nausea Only and Other (See Comments) 02/13/2022   Oxycodone Nausea Only and Other (See Comments) 02/13/2022   Tramadol Nausea Only and Other (See Comments) 02/13/2022   Whole blood Itching and Nausea And Vomiting 02/08/2021   Sulfonamide derivatives Other (See Comments)     Social History   Socioeconomic History   Marital status: Widowed    Spouse name: Not on file   Number of children: 2   Years of education: high school   Highest education level: Not on file  Occupational History   Occupation: Retired    Fish farm manager: RETIRED  Tobacco Use   Smoking status: Former    Packs/day: 0.50    Years: 40.00    Total pack years: 20.00    Types: Cigarettes    Quit date: 05/06/2001    Years since quitting: 20.7   Smokeless tobacco: Never  Vaping Use   Vaping Use: Never used  Substance and Sexual Activity   Alcohol use: No    Alcohol/week: 0.0 standard drinks of alcohol   Drug use: No   Sexual activity: Not Currently  Other Topics Concern   Not on file  Social History Narrative   Would desire  CPR   07/11/20   From: the area   Living: with great-grandson - [name] (2003)   Work: retired - clerical      Family: 2 children - [name] and [name] - 2 grand chlidren - 5 great grand children      Enjoys:  spending time with friends - watch move, eat out, spend time      Exercise: walking around the house   Diet: not good, limits red meat, limits salt, low appetite      Safety   Seat belts: Yes    Guns: Yes  and secure   Safe in relationships: Yes    Social Determinants of Health   Financial Resource Strain: Low Risk  (09/24/2021)   Overall Financial Resource Strain (CARDIA)    Difficulty of Paying Living Expenses: Not hard at all  Food Insecurity: No Food Insecurity (09/24/2021)   Hunger Vital Sign    Worried About Running Out of Food in the Last Year: Never true    [place] in the Last Year: Never true  Transportation Needs: No Transportation Needs (09/24/2021)   PRAPARE - Hydrologist (Medical): No    Lack of Transportation (Non-Medical): No  Physical Activity: Inactive (09/24/2021)   Exercise Vital Sign    Days of Exercise per Week: 0 days    Minutes of Exercise per Session: 0 min  Stress: No Stress Concern Present (09/24/2021)   [place]    Feeling of Stress : Not at all  Social Connections: Not on file  Intimate Partner Violence: Not on file    Review of Systems: All systems reviewed and negative except where noted in HPI.  Physical Exam: Vital signs in last 24 hours: Temp:  [98.1 F (36.7 C)-99.1 F (37.3 C)] 99.1 F (37.3 C) (10/12 0652) Pulse Rate:  [54-63] 59 (10/12 0645) Resp:  [13-28] 24 (10/12 0645) BP: (90-153)/(40-68) 115/48 (10/12 0645) SpO2:  [92 %-100 %] 100 % (10/12 0645) Weight:  [64.4 kg] 64.4 kg (10/11 1507)    General:  Alert female in NAD Psych:  Pleasant, cooperative. Normal mood and affect Eyes: Pupils equal, no icterus. Conjunctive  pink Ears:  Normal auditory acuity Nose: No deformity, discharge or lesions Neck:  Supple, no masses felt Lungs:  Clear to auscultation.  Heart:  Regular rate, + murmur.  No lower extremity edema Abdomen:  Soft, nondistended, nontender, active bowel sounds, no masses felt Rectal :  Deferred Msk: Symmetrical without gross deformities.  Neurologic:  Alert, oriented, grossly normal neurologically Skin:  Intact without significant lesions.    Intake/Output from previous day: No intake/output data recorded. Intake/Output this shift:  Total I/O In: 82.1 [I.V.:82.1] Out: -     Principal Problem:   Upper GI bleed Active Problems:   Mixed hyperlipidemia   Permanent atrial fibrillation   COPD (chronic obstructive pulmonary disease) (HCC)   Chronic kidney disease, stage 3b (HCC)   ICD (implantable cardioverter-defibrillator) in place   Hypertension   Chronic diastolic CHF (congestive heart failure) (HCC)   Hypokalemia   Symptomatic anemia   Hypothyroidism   COVID-19 virus infection    Tye Savoy, NP-C @  02/14/2022, 8:31 AM

## 2022-02-14 NOTE — ED Notes (Signed)
MD Gherghe notified of continued hypotension post 500cc LR bolus by previous RN, second 500cc LR bolus ordered.

## 2022-02-14 NOTE — Progress Notes (Signed)
Pharmacy Antibiotic Note  [name] is a 88 y.o. female admitted on 02/13/2022 with pneumonia.  Pharmacy has been consulted for Zosyn dosing. Patient febrile to 102.6, WBC unable to be determined from sample.   Plan: Zosyn 3.375g IV q8h (4 hour infusion). Follow culture data for de-escalation.  Monitor renal function for dose adjustments as indicated.   Height: '5\' 6"'$  (167.6 cm) Weight: 64.4 kg (142 lb) IBW/kg (Calculated) : 59.3  Temp (24hrs), Avg:98.9 F (37.2 C), Min:97.7 F (36.5 C), Max:102.6 F (39.2 C)  Recent Labs  Lab 02/12/22 1655 02/13/22 1505 02/13/22 1625 02/13/22 2106 02/14/22 0245 02/14/22 1133  WBC 2.6*  --  3.0* 2.8* 3.4* DISREGARD RESULTS.  SPECIMEN DRAWN AT INCORRECT TIME.  CREATININE 1.34*  --  1.32*  --   --   --   LATICACIDVEN  --  1.2  --   --   --   --     Estimated Creatinine Clearance: 29.2 mL/min (A) (by C-G formula based on SCr of 1.32 mg/dL (H)).    Allergies  Allergen Reactions   Dilaudid [Hydromorphone] Other (See Comments)    Excessive Somnolence- Required Narcan   Fentanyl Other (See Comments)    Required Narcan when given with Dilaudid. Tolerated single dose when given during procedure 02/03/2019.    Codeine Nausea And Vomiting    Hallucinations    Morphine And Related Nausea And Vomiting    Hallucinations    Hydrocodone Nausea Only and Other (See Comments)    Nausea and lethargy   Oxycodone Nausea Only and Other (See Comments)    Nausea and lethargy   Tramadol Nausea Only and Other (See Comments)    Nausea and lethargy   Whole Blood Itching and Nausea And Vomiting    Pt and family stated pt will receive blood products but had a reaction requiring benadryl on a previous administration.    Sulfonamide Derivatives Other (See Comments)    Dry mouth    Thank you for allowing pharmacy to be a part of this patient's care.  Ventura Sellers 02/14/2022 12:46 PM

## 2022-02-14 NOTE — ED Notes (Signed)
Blood had to be paused d/t IV infiltration. Protonix paused until IV team can obtain 2nd access. Blood restarted in L AC access.

## 2022-02-15 ENCOUNTER — Inpatient Hospital Stay (HOSPITAL_COMMUNITY): Payer: PPO | Admitting: Anesthesiology

## 2022-02-15 ENCOUNTER — Inpatient Hospital Stay (HOSPITAL_COMMUNITY): Payer: PPO

## 2022-02-15 ENCOUNTER — Encounter (HOSPITAL_COMMUNITY)
Admission: EM | Disposition: A | Discharge status: Home Health | Payer: Self-pay | Source: Home / Self Care | Attending: Internal Medicine

## 2022-02-15 DIAGNOSIS — I5032 Chronic diastolic (congestive) heart failure: Secondary | ICD-10-CM

## 2022-02-15 DIAGNOSIS — K31819 Angiodysplasia of stomach and duodenum without bleeding: Secondary | ICD-10-CM | POA: Diagnosis not present

## 2022-02-15 DIAGNOSIS — K222 Esophageal obstruction: Secondary | ICD-10-CM

## 2022-02-15 DIAGNOSIS — K2971 Gastritis, unspecified, with bleeding: Secondary | ICD-10-CM

## 2022-02-15 DIAGNOSIS — K449 Diaphragmatic hernia without obstruction or gangrene: Secondary | ICD-10-CM

## 2022-02-15 DIAGNOSIS — R778 Other specified abnormalities of plasma proteins: Secondary | ICD-10-CM

## 2022-02-15 DIAGNOSIS — J449 Chronic obstructive pulmonary disease, unspecified: Secondary | ICD-10-CM

## 2022-02-15 DIAGNOSIS — K922 Gastrointestinal hemorrhage, unspecified: Secondary | ICD-10-CM | POA: Diagnosis not present

## 2022-02-15 DIAGNOSIS — Z87891 Personal history of nicotine dependence: Secondary | ICD-10-CM

## 2022-02-15 HISTORY — PX: ENTEROSCOPY: SHX5533

## 2022-02-15 HISTORY — PX: HOT HEMOSTASIS: SHX5433

## 2022-02-15 LAB — CBC
HCT: 24.6 % — ABNORMAL LOW (ref 36.0–46.0)
HCT: 25.3 % — ABNORMAL LOW (ref 36.0–46.0)
HCT: 24 % — ABNORMAL LOW (ref 36.0–46.0)
Hemoglobin: 8.3 g/dL — ABNORMAL LOW (ref 12.0–15.0)
Hemoglobin: 8.5 g/dL — ABNORMAL LOW (ref 12.0–15.0)
Hemoglobin: 7.6 g/dL — ABNORMAL LOW (ref 12.0–15.0)
MCH: 29.4 pg (ref 26.0–34.0)
MCH: 29.4 pg (ref 26.0–34.0)
MCH: 28.4 pg (ref 26.0–34.0)
MCHC: 33.7 g/dL (ref 30.0–36.0)
MCHC: 33.6 g/dL (ref 30.0–36.0)
MCHC: 31.7 g/dL (ref 30.0–36.0)
MCV: 87.2 fL (ref 80.0–100.0)
MCV: 87.5 fL (ref 80.0–100.0)
MCV: 89.6 fL (ref 80.0–100.0)
Platelets: 123 10*3/uL — ABNORMAL LOW (ref 150–400)
Platelets: 133 10*3/uL — ABNORMAL LOW (ref 150–400)
Platelets: 161 10*3/uL (ref 150–400)
RBC: 2.82 MIL/uL — ABNORMAL LOW (ref 3.87–5.11)
RBC: 2.89 MIL/uL — ABNORMAL LOW (ref 3.87–5.11)
RBC: 2.68 MIL/uL — ABNORMAL LOW (ref 3.87–5.11)
RDW: 16.3 % — ABNORMAL HIGH (ref 11.5–15.5)
RDW: 16.5 % — ABNORMAL HIGH (ref 11.5–15.5)
RDW: 16.6 % — ABNORMAL HIGH (ref 11.5–15.5)
WBC: 6.8 10*3/uL (ref 4.0–10.5)
WBC: 6.2 10*3/uL (ref 4.0–10.5)
WBC: 5.5 10*3/uL (ref 4.0–10.5)
nRBC: 0 % (ref 0.0–0.2)
nRBC: 0 % (ref 0.0–0.2)
nRBC: 0.7 % — ABNORMAL HIGH (ref 0.0–0.2)

## 2022-02-15 LAB — ECHOCARDIOGRAM COMPLETE
AR max vel: 1.73 cm2
AV Peak grad: 12.5 mmHg
Ao pk vel: 1.77 m/s
Area-P 1/2: 4.41 cm2
Height: 66 in
MV VTI: 1.22 cm2
P 1/2 time: 337 msec
S' Lateral: 2.6 cm
Weight: 2272 oz

## 2022-02-15 LAB — COMPREHENSIVE METABOLIC PANEL WITH GFR
ALT: 14 U/L (ref 0–44)
AST: 33 U/L (ref 15–41)
Albumin: 2.6 g/dL — ABNORMAL LOW (ref 3.5–5.0)
Alkaline Phosphatase: 42 U/L (ref 38–126)
Anion gap: 8 (ref 5–15)
BUN: 43 mg/dL — ABNORMAL HIGH (ref 8–23)
CO2: 26 mmol/L (ref 22–32)
Calcium: 8 mg/dL — ABNORMAL LOW (ref 8.9–10.3)
Chloride: 103 mmol/L (ref 98–111)
Creatinine, Ser: 1.4 mg/dL — ABNORMAL HIGH (ref 0.44–1.00)
GFR, Estimated: 37 mL/min — ABNORMAL LOW
Glucose, Bld: 153 mg/dL — ABNORMAL HIGH (ref 70–99)
Potassium: 3.8 mmol/L (ref 3.5–5.1)
Sodium: 137 mmol/L (ref 135–145)
Total Bilirubin: 1.1 mg/dL (ref 0.3–1.2)
Total Protein: 5.1 g/dL — ABNORMAL LOW (ref 6.5–8.1)

## 2022-02-15 LAB — SURGICAL PCR SCREEN
MRSA, PCR: NEGATIVE
Staphylococcus aureus: POSITIVE — AB

## 2022-02-15 LAB — MAGNESIUM: Magnesium: 1.9 mg/dL (ref 1.7–2.4)

## 2022-02-15 SURGERY — ENTEROSCOPY
Anesthesia: Monitor Anesthesia Care

## 2022-02-15 MED ORDER — PANTOPRAZOLE SODIUM 40 MG PO TBEC
40.0000 mg | DELAYED_RELEASE_TABLET | Freq: Two times a day (BID) | ORAL | Status: DC
  Administered 2022-02-15 – 2022-02-24 (×15): 40 mg via ORAL
  Filled 2022-02-15 (×18): qty 1

## 2022-02-15 MED ORDER — DEXAMETHASONE SODIUM PHOSPHATE 10 MG/ML IJ SOLN
INTRAMUSCULAR | Status: DC | PRN
  Administered 2022-02-15: 5 mg via INTRAVENOUS

## 2022-02-15 MED ORDER — SODIUM CHLORIDE 0.9 % IV SOLN: INTRAVENOUS | Status: DC

## 2022-02-15 MED ORDER — LIDOCAINE 2% (20 MG/ML) 5 ML SYRINGE
INTRAMUSCULAR | Status: DC | PRN
  Administered 2022-02-15: 25 mg via INTRAVENOUS
  Administered 2022-02-15: 20 mg via INTRAVENOUS
  Administered 2022-02-15: 80 mg via INTRAVENOUS

## 2022-02-15 MED ORDER — PROPOFOL 500 MG/50ML IV EMUL
INTRAVENOUS | Status: DC | PRN
  Administered 2022-02-15: 70 ug/kg/min via INTRAVENOUS

## 2022-02-15 MED ORDER — EPHEDRINE SULFATE-NACL 50-0.9 MG/10ML-% IV SOSY
PREFILLED_SYRINGE | INTRAVENOUS | Status: DC | PRN
  Administered 2022-02-15 (×4): 5 mg via INTRAVENOUS

## 2022-02-15 MED ORDER — PROPOFOL 10 MG/ML IV BOLUS
INTRAVENOUS | Status: DC | PRN
  Administered 2022-02-15: 30 mg via INTRAVENOUS
  Administered 2022-02-15: 10 mg via INTRAVENOUS

## 2022-02-15 MED ORDER — ONDANSETRON HCL 4 MG/2ML IJ SOLN
INTRAMUSCULAR | Status: DC | PRN
  Administered 2022-02-15: 4 mg via INTRAVENOUS

## 2022-02-15 NOTE — Anesthesia Preprocedure Evaluation (Addendum)
Anesthesia Evaluation  Patient identified by MRN, date of birth, ID band Patient awake    Reviewed: Allergy & Precautions, NPO status , Patient's Chart, lab work & pertinent test results  History of Anesthesia Complications (+) PONV and history of anesthetic complications  Airway Mallampati: III  TM Distance: >3 FB Neck ROM: Full    Dental  (+) Dental Advisory Given, Missing   Pulmonary COPD, former smoker,  Quit smoking 2003, 20 pack year history  COVID +   Pulmonary exam normal breath sounds clear to auscultation       Cardiovascular hypertension, Pt. on medications pulmonary hypertension (severe pHTN on echo 06/2021)+ CAD, + Peripheral Vascular Disease and +CHF (mild reduction of RV systolic function on echo 06/2021, normal LVEF at that time)  Normal cardiovascular exam+ dysrhythmias (eliquis) Atrial Fibrillation + pacemaker + Cardiac Defibrillator + Valvular Problems/Murmurs (mod AS) AS  Rhythm:Regular Rate:Normal  Echo 06/2021 1. Left ventricular ejection fraction, by estimation, is 65 to 70%. The  left ventricle has normal function. The left ventricle has no regional  wall motion abnormalities. There is mild left ventricular hypertrophy.  Left ventricular diastolic parameters  are indeterminate.  2. D-shaped septum suggestive of RV pressure/volume overload. Right  ventricular systolic function is mildly reduced. The right ventricular  size is moderately enlarged. There is severely elevated pulmonary artery  systolic pressure. The estimated right  ventricular systolic pressure is 87.5 mmHg.  3. Left atrial size was mildly dilated.  4. Right atrial size was moderately dilated.  5. The mitral valve is degenerative. Trivial mitral valve regurgitation.  No evidence of mitral stenosis. Moderate mitral annular calcification.  6. The tricuspid valve is abnormal. Tricuspid valve regurgitation is  moderate to severe.  7. The  aortic valve is tricuspid. Aortic valve regurgitation is trivial.  Moderate aortic valve stenosis. Aortic valve area, by VTI measures 1.27  cm. Aortic valve mean gradient measures 14.0 mmHg.  8. The inferior vena cava is dilated in size with <50% respiratory  variability, suggesting right atrial pressure of 15 mmHg.    Neuro/Psych  Headaches, TIAnegative psych ROS   GI/Hepatic Neg liver ROS, GIB   Endo/Other  Hypothyroidism   Renal/GU Renal InsufficiencyRenal diseaseCr 1.4 S/p nephrectomy 2003  negative genitourinary   Musculoskeletal  (+) Arthritis , Osteoarthritis,    Abdominal   Peds  Hematology  (+) Blood dyscrasia, anemia , Hb 8.3 this AM, plt 123   Anesthesia Other Findings   Reproductive/Obstetrics negative OB ROS                           Anesthesia Physical Anesthesia Plan  ASA: 4  Anesthesia Plan: MAC   Post-op Pain Management:    Induction:   PONV Risk Score and Plan: 2 and Propofol infusion and TIVA  Airway Management Planned: Natural Airway and Simple Face Mask  Additional Equipment: None  Intra-op Plan:   Post-operative Plan:   Informed Consent: I have reviewed the patients History and Physical, chart, labs and discussed the procedure including the risks, benefits and alternatives for the proposed anesthesia with the patient or authorized representative who has indicated his/her understanding and acceptance.     Dental advisory given  Plan Discussed with: CRNA  Anesthesia Plan Comments:        Anesthesia Quick Evaluation

## 2022-02-15 NOTE — Transfer of Care (Signed)
Immediate Anesthesia Transfer of Care Note  Patient: [name]  Procedure(s) Performed: ENTEROSCOPY HOT HEMOSTASIS (ARGON PLASMA COAGULATION/BICAP)  Patient Location: PACU  Anesthesia Type:MAC  Level of Consciousness: drowsy  Airway & Oxygen Therapy: Patient Spontanous Breathing and Patient connected to nasal cannula oxygen  Post-op Assessment: Report given to RN and Post -op Vital signs reviewed and stable  Post vital signs: Reviewed and stable  Last Vitals:  Vitals Value Taken Time  BP    Temp    Pulse    Resp    SpO2      Last Pain:  Vitals:   02/15/22 0955  TempSrc: Temporal  PainSc: 0-No pain      Patients Stated Pain Goal: 0 (16/60/60 0459)  Complications: No notable events documented.

## 2022-02-15 NOTE — Anesthesia Postprocedure Evaluation (Signed)
Anesthesia Post Note  Patient: [name]  Procedure(s) Performed: ENTEROSCOPY HOT HEMOSTASIS (ARGON PLASMA COAGULATION/BICAP)     Patient location during evaluation: PACU Anesthesia Type: MAC Level of consciousness: awake and alert Pain management: pain level controlled Vital Signs Assessment: post-procedure vital signs reviewed and stable Respiratory status: spontaneous breathing, nonlabored ventilation and respiratory function stable Cardiovascular status: blood pressure returned to baseline and stable Postop Assessment: no apparent nausea or vomiting Anesthetic complications: no   No notable events documented.  Last Vitals:  Vitals:   02/15/22 1110 02/15/22 1141  BP: 119/77 (!) 125/52  Pulse: 72 60  Resp: (!) 32 (!) 23  Temp:    SpO2: 94% 94%    Last Pain:  Vitals:   02/15/22 1101  TempSrc:   PainSc: 0-No pain                 Pervis Hocking

## 2022-02-15 NOTE — Evaluation (Signed)
Physical Therapy Evaluation Patient Details Name: [name] MRN: [id number] DOB: Oct 02, 1933 Today's Date: 02/15/2022  History of Present Illness  88 yo female presents to [name] on 10/11 with low Hgb and iron with heme + stools, covid +. Workup for UGIB. Received unit of blood 10/12 and developed possible transfusion reaction, including hypoxia, rigors. NSTEMI from demand ischemia. PMHx:  afib, dCHF, severe tricuspid regurgitation, NICM s/p ICD, COPD, PAD, CKD3, RCC s/p nephrectomy, ischemic colitis, bilat THA.  Clinical Impression   Pt presents with generalized weakness, impaired balance, increased time and effort to mobilize, and decreased activity tolerance. Pt to benefit from acute PT to address deficits. Pt ambulated room distance only given fatigue, VSS throughout on RA. PT anticipates pt will progress while acute, PT placed mobility specialist referral to promote frequent mobility while hospitalized. PT to progress mobility as tolerated, and will continue to follow acutely.         Recommendations for follow up therapy are one component of a multi-disciplinary discharge planning process, led by the attending physician.  Recommendations may be updated based on patient status, additional functional criteria and insurance authorization.  Follow Up Recommendations Home health PT      Assistance Recommended at Discharge PRN  Patient can return home with the following  A little help with walking and/or transfers;A little help with bathing/dressing/bathroom    Equipment Recommendations None recommended by PT  Recommendations for Other Services       Functional Status Assessment Patient has had a recent decline in their functional status and demonstrates the ability to make significant improvements in function in a reasonable and predictable amount of time.     Precautions / Restrictions Precautions Precautions: Fall Restrictions Weight Bearing Restrictions: No       Mobility  Bed Mobility Overal bed mobility: Needs Assistance Bed Mobility: Supine to Sit     Supine to sit: Supervision, HOB elevated          Transfers Overall transfer level: Needs assistance Equipment used: 1 person hand held assist Transfers: Sit to/from Stand Sit to Stand: Min assist           General transfer comment: boost assist via HHA, once standing pt reaching for RW    Ambulation/Gait Ambulation/Gait assistance: Supervision Gait Distance (Feet): 25 Feet Assistive device: Rolling walker (2 wheels) Gait Pattern/deviations: Step-through pattern, Decreased stride length, Trunk flexed Gait velocity: decr     General Gait Details: slowed, shuffling steps as pt states she feels weak and hasn't eaten in 3 days  Stairs            Wheelchair Mobility    Modified Rankin (Stroke Patients Only)       Balance Overall balance assessment: Needs assistance Sitting-balance support: No upper extremity supported, Feet supported Sitting balance-Leahy Scale: Good     Standing balance support: Bilateral upper extremity supported, During functional activity Standing balance-Leahy Scale: Fair Standing balance comment: preference to UE support, min guard for safety                             Pertinent Vitals/Pain Pain Assessment Pain Assessment: No/denies pain    Home Living Family/patient expects to be discharged to:: Private residence Living Arrangements: Other relatives Customer service manager) Available Help at Discharge: Family;Available PRN/intermittently Type of Home: House Home Access: Stairs to enter   Entrance Stairs-Number of Steps: few   Home Layout: One level Home Equipment: Advice worker (2 wheels);Cane -  single point;Grab bars - tub/shower;BSC/3in1      Prior Function Prior Level of Function : Independent/Modified Independent               ADLs Comments: independent ADLs, IADLs, driving     Hand Dominance    Dominant Hand: Right    Extremity/Trunk Assessment   Upper Extremity Assessment Upper Extremity Assessment: Defer to OT evaluation    Lower Extremity Assessment Lower Extremity Assessment: Generalized weakness    Cervical / Trunk Assessment Cervical / Trunk Assessment: Normal  Communication   Communication: No difficulties  Cognition Arousal/Alertness: Awake/alert Behavior During Therapy: WFL for tasks assessed/performed Overall Cognitive Status: Within Functional Limits for tasks assessed                                          General Comments General comments (skin integrity, edema, etc.): vss on RA    Exercises     Assessment/Plan    PT Assessment Patient needs continued PT services  PT Problem List Decreased strength;Decreased coordination;Decreased mobility;Decreased activity tolerance;Decreased balance;Decreased knowledge of use of DME       PT Treatment Interventions DME instruction;Therapeutic activities;Gait training;Therapeutic exercise;Patient/family education;Balance training;Stair training;Functional mobility training    PT Goals (Current goals can be found in the Care Plan section)  Acute Rehab PT Goals PT Goal Formulation: With patient Time For Goal Achievement: 03/01/22 Potential to Achieve Goals: Good    Frequency Min 3X/week     Co-evaluation               AM-PAC PT "6 Clicks" Mobility  Outcome Measure Help needed turning from your back to your side while in a flat bed without using bedrails?: None Help needed moving from lying on your back to sitting on the side of a flat bed without using bedrails?: A Little Help needed moving to and from a bed to a chair (including a wheelchair)?: A Little Help needed standing up from a chair using your arms (e.g., wheelchair or bedside chair)?: A Little Help needed to walk in hospital room?: A Little Help needed climbing 3-5 steps with a railing? : A Little 6 Click Score:  19    End of Session   Activity Tolerance: Patient tolerated treatment well Patient left: in bed;with call bell/phone within reach;Other (comment) (OT walking into room, pt sitting EOB) Nurse Communication: Mobility status PT Visit Diagnosis: Other abnormalities of gait and mobility (R26.89);Difficulty in walking, not elsewhere classified (R26.2)    Time: 0355-9741 PT Time Calculation (min) (ACUTE ONLY): 25 min   Charges:   PT Evaluation $PT Eval Low Complexity: 1 Low PT Treatments $Therapeutic Activity: 8-22 mins       Stacie Glaze, PT DPT Acute Rehabilitation Services Pager [phone]  Office [phone]   [place] E Stroup 02/15/2022, 10:50 AM

## 2022-02-15 NOTE — Op Note (Addendum)
[name] Patient Name: [name] Procedure Date : 02/15/2022 MRN: [id number] Attending MD: Georgian Co ,  Date of Birth: 1933-12-18 CSN: [id number] Age: 88 Admit Type: Inpatient Procedure:                Small bowel enteroscopy Indications:              Melena Providers:                Adline Mango" Theodis Sato, RN, Gloris Ham, Technician Referring MD:             Hospitalist team Medicines:                Monitored Anesthesia Care Complications:            No immediate complications. Estimated Blood Loss:     Estimated blood loss was minimal. Procedure:                Pre-Anesthesia Assessment:                           - Prior to the procedure, a History and Physical                            was performed, and patient medications and                            allergies were reviewed. The patient's tolerance of                            previous anesthesia was also reviewed. The risks                            and benefits of the procedure and the sedation                            options and risks were discussed with the patient.                            All questions were answered, and informed consent                            was obtained. Prior Anticoagulants: The patient has                            taken Eliquis (apixaban), last dose was 3 days                            prior to procedure. ASA Grade Assessment: III - A                            patient with severe systemic disease. After  reviewing the risks and benefits, the patient was                            deemed in satisfactory condition to undergo the                            procedure.                           After obtaining informed consent, the endoscope was                            passed under direct vision. Throughout the                            procedure, the patient's blood pressure,  pulse, and                            oxygen saturations were monitored continuously. The                            [id number]-HQ190L ([id number] Olympus colonoscope was                            introduced through the mouth and advanced to the                            proximal jejunum. The small bowel enteroscopy was                            accomplished without difficulty. The patient                            tolerated the procedure well. Scope In: Scope Out: Findings:      The examined esophagus was normal.      A hiatal hernia was present.      Localized mild inflammation characterized by congestion (edema),       erosions and erythema was found in the gastric antrum.      Two angioectasias with no bleeding were found in the second portion of       the duodenum. Coagulation for bleeding prevention using argon plasma at       0.8 liters/minute and 20 watts was successful.      There was no evidence of significant pathology in the proximal jejunum. Impression:               - Normal esophagus.                           - Hiatal hernia.                           - Gastritis.                           - Two non-bleeding angioectasias in the duodenum.  Treated with argon plasma coagulation (APC).                           - The examined portion of the jejunum was normal.                           - No specimens collected. Recommendation:           - Return patient to [name] ward for ongoing care.                           - Use a proton pump inhibitor PO BID for 8 weeks.                           - GI will plan to see patient tomorrow and decide                            if would be okay to restart Eliquis.                           - Continue to trend Hb.                           - Clear liquid diet for today. Advance tomorrow if                            appropriate.                           - The findings and recommendations were discussed                             with the patient. Procedure Code(s):        --- Professional ---                           [phone], Small intestinal endoscopy, enteroscopy                            beyond second portion of duodenum, not including                            ileum; with control of bleeding (eg, injection,                            bipolar cautery, unipolar cautery, laser, heater                            probe, stapler, plasma coagulator) Diagnosis Code(s):        --- Professional ---                           K22.2, Esophageal obstruction                           K44.9, Diaphragmatic  hernia without obstruction or                            gangrene                           K29.70, Gastritis, unspecified, without bleeding                           K31.819, Angiodysplasia of stomach and duodenum                            without bleeding                           K92.1, Melena (includes Hematochezia) CPT copyright 2019 [employer]. All rights reserved. The codes documented in this report are preliminary and upon coder review may  be revised to meet current compliance requirements. Dr Georgian Co "Lyndee Leo" Lorenso Courier,  02/15/2022 11:13:01 AM Number of Addenda: 0

## 2022-02-15 NOTE — TOC Initial Note (Signed)
Transition of Care [facility]) - Initial/Assessment Note    Patient Details  Name: [name] MRN: [id number] Date of Birth: [birth year]  Transition of Care [facility]) CM/SW Contact:    Geralynn Ochs, LCSW Phone Number: 02/15/2022, 1:32 PM  Clinical Narrative:         CSW unable to reach patient via phone, spoke with daughter, [name], who was in the room with patient but patient undergoing a test at the time. CSW discussed recommendation for [facility], and daughter unsure if patient will want that or not. Per daughter, patient has said she will agree to rehab services but wants to discuss with her prior to agreeing. Daughter asked for someone to touch base with her tomorrow for decision. Patient lives with grandson who is an EMT and can assist, daughters also check on her frequently. CSW to follow.          Expected Discharge Plan: [place] Barriers to Discharge: Continued Medical Work up   Patient Goals and CMS Choice Patient states their goals for this hospitalization and ongoing recovery are:: to feel better CMS Medicare.gov Compare Post Acute Care list provided to:: Patient Represenative (must comment) Choice offered to / list presented to : Adult Children  Expected Discharge Plan and Services Expected Discharge Plan: [place] Acute Care Choice: NA Living arrangements for the past 2 months: Single Family Home                                      Prior Living Arrangements/Services Living arrangements for the past 2 months: Single Family Home Lives with:: Relatives Patient language and need for interpreter reviewed:: No Do you feel safe going back to the place where you live?: Yes      Need for Family Participation in Patient Care: No (Comment) Care giver support system in place?: Yes (comment)   Criminal Activity/Legal Involvement Pertinent to Current Situation/Hospitalization: No - Comment as needed  Activities of Daily  Living Home Assistive Devices/Equipment: Oxygen ADL Screening (condition at time of admission) Patient's cognitive ability adequate to safely complete daily activities?: Yes Is the patient deaf or have difficulty hearing?: No Does the patient have difficulty seeing, even when wearing glasses/contacts?: No Does the patient have difficulty concentrating, remembering, or making decisions?: No Patient able to express need for assistance with ADLs?: Yes Does the patient have difficulty dressing or bathing?: No Independently performs ADLs?: Yes (appropriate for developmental age) Does the patient have difficulty walking or climbing stairs?: Yes Weakness of Legs: Both Weakness of Arms/Hands: None  Permission Sought/Granted                  Emotional Assessment   Attitude/Demeanor/Rapport: Unable to Assess Affect (typically observed): Unable to Assess Orientation: : Oriented to Self, Oriented to Place, Oriented to  Time, Oriented to Situation Alcohol / Substance Use: Not Applicable Psych Involvement: No (comment)  Admission diagnosis:  Acute upper gastrointestinal bleeding [K92.2] GI bleed [K92.2] Upper GI bleed [K92.2] Patient Active Problem List   Diagnosis Date Noted   GI bleed 02/14/2022   Upper GI bleed 02/13/2022   COVID-19 virus infection 02/12/2022   Osteoporosis 10/11/2021   Acute bilateral low back pain without sciatica 10/11/2021   Dark stools 06/04/2021   Left foot pain 04/11/2021   Squamous cell carcinoma of foot, 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   Acute gout due  to renal impairment involving left foot 01/30/2021   Elevated uric acid in blood 01/30/2021   Hypotension 12/05/2020   Vertigo 12/05/2020   Acute nonintractable headache 10/05/2020   Cellulitis of left lower extremity 07/13/2020   History of blood transfusion reaction 04/17/2020   AVM (arteriovenous malformation) of small bowel, acquired with hemorrhage    Gastric polyps    Supratherapeutic INR 03/27/2020    Hypothyroidism 03/27/2020   Palliative care by specialist    Goals of care, counseling/discussion    Iron deficiency anemia 01/06/2020   History of GI bleed 11/11/2019   Chronic venous hypertension w ulceration ([place])    Symptomatic anemia 02/25/2019   Acute on chronic diastolic CHF (congestive heart failure) ([place]) 11/04/2018   Chronic bilateral pleural effusions 11/04/2018   Hypokalemia    Afib ([place]) 06/01/2018   Iliac artery stenosis, bilateral ([place]) 07/14/2017   Anticoagulated 05/08/2017   Burning with urination 11/18/2016   Pulmonary hypertension ([place]) 12/04/2015   B12 deficiency 05/22/2015   PAD (peripheral artery disease) ([place])    History of nephrectomy secondary to [place]    TIA (transient ischemic attack) 08/16/2014   Other fatigue 01/14/2014   Chronic diastolic CHF (congestive heart failure) ([place]) 06/04/2013   Mitral valve regurgitation 06/04/2013   Allergic rhinitis 01/01/2013   Chronic cholecystitis with calculus 09/15/2012   Claudication ([place]) 01/21/2012   Intermittent lightheadedness 11/26/2011   Hypertension 05/20/2011   HOCM / IHSS 07/14/2009   Mixed hyperlipidemia 04/10/2009   INCONTINENCE, URGE 04/10/2009   Non-ischemic cardiomyopathy ([place]) 10/27/2008   Permanent atrial fibrillation 10/27/2008   SINUS BRADYCARDIA 10/27/2008   Carotid artery stenosis 10/27/2008   COPD (chronic obstructive pulmonary disease) ([place]) 10/27/2008   Chronic kidney disease, stage 3b ([place]) 10/27/2008   ICD (implantable cardioverter-defibrillator) in place 10/27/2008   PCP:  Ria Bush, MD Pharmacy:   [place], [address] [place] [address] Phone: [phone] Fax: [phone]     Social Determinants of Health (SDOH) Interventions    Readmission Risk Interventions    02/15/2021   11:38 AM 03/31/2020    1:20 PM  Readmission Risk Prevention Plan  Transportation Screening Complete Complete  PCP or Specialist  Appt within 5-7 Days Complete   Home Care Screening Complete   Medication Review (RN CM) Complete   PCP or Specialist appointment within 3-5 days of discharge  Not Complete  PCP/Specialist Appt Not Complete comments  office closed for Thanksgiving holiday  [place] or [place]  Complete  SW Recovery Care/Counseling Consult  Complete  Palliative Care Screening  Not [place]  Not Applicable

## 2022-02-15 NOTE — Evaluation (Signed)
Occupational Therapy Evaluation Patient Details Name: [name] MRN: [id number] DOB: Sep 22, 1933 Today's Date: 02/15/2022   History of Present Illness 88 yo female [facility] to [facility]11 with low Hgb and iron with heme + stools, covid +. Workup for UGIB. Received unit of blood 10/12 and developed possible transfusion reaction, including hypoxia, rigors. NSTEMI from demand ischemia. PMHx:  afib, dCHF, severe tricuspid regurgitation, NICM s/p ICD, COPD, PAD, CKD3, RCC s/p nephrectomy, ischemic colitis, bilat THA.   Clinical Impression   PTA patient independent. Admitted for above and presents with problem list below, including generalized weakness, decreased activity tolerance, mild unsteadiness and dizziness with activity.  Pt completing ADLs with min guard using RW, decreased tolerance and reviewed energy conservation techniques.  She reports dizziness during activities, BP assessed after mobilization sitting EOB, standing and standing x 3 minutes with BP stables in 110s/50s.  Educated on safety, increased time with transitions- voiced understanding.  Based on performance today, believe she will best benefit from further OT service acutely and after dc at [facility] level to optimize independence, safety and return to PLOF.      Recommendations for follow up therapy are one component of a multi-disciplinary discharge planning process, led by the attending physician.  Recommendations may be updated based on patient status, additional functional criteria and insurance authorization.   Follow Up Recommendations  Home health OT    Assistance Recommended at Discharge Intermittent Supervision/Assistance  Patient can return home with the following A little help with walking and/or transfers;A little help with bathing/dressing/bathroom;Assistance with cooking/housework;Assist for transportation;Help with stairs or ramp for entrance    Functional Status Assessment  Patient has had a recent decline  in their functional status and demonstrates the ability to make significant improvements in function in a reasonable and predictable amount of time.  Equipment Recommendations  None recommended by OT    Recommendations for Other Services       Precautions / Restrictions Precautions Precautions: Fall Restrictions Weight Bearing Restrictions: No      Mobility Bed Mobility               General bed mobility comments: EOB upon entry    Transfers Overall transfer level: Needs assistance Equipment used: Rolling walker (2 wheels) Transfers: Sit to/from Stand Sit to Stand: Min guard           General transfer comment: cueing for hand placement, increased time to power up but no physical assistance required      Balance Overall balance assessment: Needs assistance Sitting-balance support: No upper extremity supported, Feet supported Sitting balance-Leahy Scale: Good     Standing balance support: Bilateral upper extremity supported, During functional activity Standing balance-Leahy Scale: Fair Standing balance comment: preference to UE support, min guard for safety                           ADL either performed or assessed with clinical judgement   ADL Overall ADL's : Needs assistance/impaired     Grooming: Min guard;Standing           Upper Body Dressing : Set up;Sitting   Lower Body Dressing: Min guard;Sit to/from stand   Toilet Transfer: Min guard;Ambulation;Rolling walker (2 wheels)           Functional mobility during ADLs: Min guard;Cueing for safety;Rolling walker (2 wheels) General ADL Comments: pt limited by dizziness during activity, vitals assessed sitting EOB/standing stable (BP 110s/50s)     Vision  Vision Assessment?: No apparent visual deficits     Perception     Praxis      Pertinent Vitals/Pain Pain Assessment Pain Assessment: No/denies pain     Hand Dominance Right   Extremity/Trunk Assessment Upper  Extremity Assessment Upper Extremity Assessment: Generalized weakness   Lower Extremity Assessment Lower Extremity Assessment: Defer to PT evaluation       Communication Communication Communication: No difficulties   Cognition Arousal/Alertness: Awake/alert Behavior During Therapy: WFL for tasks assessed/performed Overall Cognitive Status: Within Functional Limits for tasks assessed                                       General Comments  reviewed safety, energy conservation techniques and increased time with transitions due to dizziness    Exercises     Shoulder Instructions      Home Living Family/patient expects to be discharged to:: Private residence Living Arrangements: Other relatives Customer service manager) Available Help at Discharge: Family;Available PRN/intermittently Type of Home: House Home Access: Stairs to enter [employer] of Steps: few   Home Layout: One level     Bathroom Shower/Tub: Teacher, early years/pre: Handicapped height     Home Equipment: Advice worker (2 wheels);Cane - single point;Grab bars - tub/shower;BSC/3in1          Prior Functioning/Environment Prior Level of Function : Independent/Modified Independent               ADLs Comments: independent ADLs, IADLs, driving        OT Problem List: Decreased strength;Decreased activity tolerance;Impaired balance (sitting and/or standing);Decreased knowledge of use of DME or AE;Decreased knowledge of precautions;Cardiopulmonary status limiting activity      OT Treatment/Interventions: Self-care/ADL training;Therapeutic exercise;DME and/or AE instruction;Therapeutic activities;Patient/family education;Energy conservation    OT Goals(Current goals can be found in the care plan section) Acute Rehab OT Goals Patient Stated Goal: feel better OT Goal Formulation: With patient Time For Goal Achievement: 03/01/22 Potential to Achieve Goals: Good   OT Frequency: Min 2X/week    Co-evaluation              AM-PAC OT "6 Clicks" Daily Activity     Outcome Measure Help from another person eating meals?: Total (NPO) Help from another person taking care of personal grooming?: A Little Help from another person toileting, which includes using toliet, bedpan, or urinal?: A Little Help from another person bathing (including washing, rinsing, drying)?: A Little Help from another person to put on and taking off regular upper body clothing?: A Little Help from another person to put on and taking off regular lower body clothing?: A Little 6 Click Score: 16   End of Session Equipment Utilized During Treatment: Rolling walker (2 wheels) Nurse Communication: Mobility status  Activity Tolerance: Patient tolerated treatment well Patient left: with call bell/phone within reach;Other (comment) (seated EOB)  OT Visit Diagnosis: Muscle weakness (generalized) (M62.81);Unsteadiness on feet (R26.81)                Time: 0539-7673 OT Time Calculation (min): 19 min Charges:  OT General Charges $OT Visit: 1 Visit OT Evaluation $OT Eval Moderate Complexity: 1 Mod  Jolaine Artist, OT Acute Rehabilitation Services Office [phone]   Delight Stare 02/15/2022, 10:35 AM

## 2022-02-15 NOTE — Progress Notes (Signed)
PROGRESS NOTE  [name] YIR:[id number] DOB: [birth year] DOA: 02/13/2022 PCP: Ria Bush, MD   LOS: 2 days   Brief Narrative / Interim history: 88 year old female with A-fib on Eliquis, prior history of GI bleeds due to AVMs, hypertension, anemia, comes to the hospital with complaints of melena.  She was diagnosed with COVID on 10/5 (symptoms started 10/3), received antivirals and steroids with improvement in her symptoms.  Few days ago, she has been starting to have melena and became extremely weak and came to the hospital.  Significant events: 10/11-admit to hospital 10/12-significant blood transfusion reaction with fever, hypotension, rigors  Significant imaging / results / micro data: Chest x-ray 10/11-small bilateral pleural effusions 10/11-SARS-CoV-2 positive  Subjective / 24h Interval events: Feels a whole lot better today.  No further bleeding  Assesement and Plan: Principal Problem:   Upper GI bleed Active Problems:   Hypothyroidism   Mixed hyperlipidemia   Permanent atrial fibrillation   COPD (chronic obstructive pulmonary disease) (HCC)   Chronic kidney disease, stage 3b (HCC)   ICD (implantable cardioverter-defibrillator) in place   Hypertension   Chronic diastolic CHF (congestive heart failure) (HCC)   Anticoagulated   Hypokalemia   Symptomatic anemia   COVID-19 virus infection   GI bleed   Principal problem Upper GI bleed-gastroenterology consulted, appreciate input.  She has known gastric and small bowel AVMs.  She has been placed on PPI, Eliquis has been held for the past couple of days by patient.  -Underwent EGD this morning, found to have 2 nonbleeding angiectasis in the duodenum, status post APC.  Active problems Acute blood loss anemia-hemoglobin down to 7.2, from 11 a few months ago.  Received partially blood yesterday, developed transfusion reaction.  Fortunately bleeding seems to have stopped clinically, and her hemoglobin is stable  this morning  Transfusion reaction-on 10/12, after receiving about 200 mL of blood.  Blood bank notified.  Patient experienced initially chills, elevated blood pressure, tachycardia as well as hypoxia, had cyanotic lips requiring 10 L of oxygen to maintain sats above 90%.  Chest x-ray did not show any acute findings.  Later on she developed a fever to 102, hypotension, and there was concern for infected unit, and started on antibiotics. -Of note, she was premedicated with Benadryl, Tylenol, and she also received steroids following the reaction  COVID-19-seems to be resolving.  Original diagnosis was 8 days ago on 10/5.  She will need to be on isolation for total of 10 days, until 10/15  Paroxysmal A-fib on Eliquis-currently Eliquis is on hold.  She does not appear to be on rate limiting agents at home.  Continue to monitor on telemetry  ?Hypothyroidism -mentioned in the H&P, has been placed on Synthroid but I do not see this medication on her home regimen.  Will discuss with patient and family  ICD in place-noted  Essential hypertension-hold antihypertensives in the setting of GI bleed  Hyperlipidemia-continue statin  Chronic diastolic CHF-euvolemic this morning  CKD 3B-baseline creatinine ranging anywhere from 1.3-1.7, currently at baseline.  Continue to monitor  COPD-chronic, stable, no wheezing  Scheduled Meds:  [MAR Hold] sodium chloride   Intravenous Once   [MAR Hold] ezetimibe  10 mg Oral Daily   [MAR Hold] levothyroxine  25 mcg Oral Q0600   [MAR Hold] pantoprazole  40 mg Intravenous Q12H   [MAR Hold] pantoprazole (PROTONIX) IV  40 mg Intravenous Once   [MAR Hold] rosuvastatin  40 mg Oral Daily   Continuous Infusions:  sodium chloride Stopped (02/15/22  1103)   lactated ringers 150 mL/hr at 02/15/22 0130   pantoprazole 8 mg/hr (02/15/22 0455)   [MAR Hold] piperacillin-tazobactam (ZOSYN)  IV 3.375 g (02/15/22 0616)   PRN Meds:.[MAR Hold] acetaminophen **OR** [MAR Hold]  acetaminophen, [MAR Hold] albuterol, [MAR Hold] guaiFENesin-dextromethorphan, [MAR Hold] ondansetron (ZOFRAN) IV  Current Outpatient Medications  Medication Instructions   acetaminophen (TYLENOL) 500-1,000 mg, Oral, Every 8 hours PRN   albuterol (VENTOLIN HFA) 108 (90 Base) MCG/ACT inhaler 2 puffs, Inhalation, Every 6 hours PRN   clobetasol cream (TEMOVATE) 0.05 % Apply to affected area left foot twice a day until improved. Avoid face, groin, axilla.   Cyanocobalamin (B-12 PO) 1 tablet, Oral, Daily   ELIQUIS 2.5 MG TABS tablet TAKE 1 TABLET BY MOUTH TWICE A DAY   fluticasone (FLONASE) 50 MCG/ACT nasal spray 2 sprays, Each Nare, Daily PRN   metolazone (ZAROXOLYN) 2.5 mg, Oral, As directed, Take every other Thursday with your morning dose of Torsemide   octreotide (SANDOSTATIN LAR) 10 mg, Intramuscular, Every 28 days   potassium chloride SA (KLOR-CON M) 20 MEQ tablet TAKE 2 TABLETS (40 MEQ) BY MOUTH 2 TIMESDAILY. TAKE 1 EXTRA TABLET ON THE DAYS YOU TAKE METOLAZONE.   torsemide (DEMADEX) 100 MG tablet TAKE 1 TABLET BY MOUTH EVERY MORNING (EVENING DOSE 80 MG)   vitamin C (ASCORBIC ACID) 500 mg, Oral, Daily   VITAMIN D PO 4,000 Units, Oral, Daily    Diet Orders (From admission, onward)     Start     Ordered   02/15/22 1114  Diet clear liquid Room service appropriate? Yes; Fluid consistency: Thin  Diet effective now       Question Answer Comment  Room service appropriate? Yes   Fluid consistency: Thin      02/15/22 1113            DVT prophylaxis: SCDs Start: 02/13/22 1923   Lab Results  Component Value Date   PLT 133 (L) 02/15/2022      Code Status: Full Code  Family Communication: no family at bedside   Status is: Inpatient Remains inpatient appropriate because: Severity of illness   Level of care: Progressive  Consultants:  GI  Objective: Vitals:   02/15/22 0955 02/15/22 1101 02/15/22 1106 02/15/22 1110  BP: (!) 130/50 99/87 117/64 119/77  Pulse: 60 70 63 72   Resp: (!) 23 (!) 30 (!) 33 (!) 32  Temp: (!) 97 F (36.1 C)     TempSrc: Temporal     SpO2: 95% 100% 100% 94%  Weight:      Height:        Intake/Output Summary (Last 24 hours) at 02/15/2022 1132 Last data filed at 02/15/2022 1054 Gross per 24 hour  Intake 1085.91 ml  Output 675 ml  Net 410.91 ml    Wt Readings from Last 3 Encounters:  02/13/22 64.4 kg  02/12/22 64.4 kg  01/09/22 66.4 kg    Examination:  Constitutional: NAD Eyes: lids and conjunctivae normal, no scleral icterus ENMT: mmm Neck: normal, supple Respiratory: clear to auscultation bilaterally, no wheezing, no crackles. Normal respiratory effort.  Cardiovascular: Regular rate and rhythm, no murmurs / rubs / gallops. No LE edema. Abdomen: soft, no distention, no tenderness. Bowel sounds positive.  Skin: no rashes Neurologic: no focal deficits, equal strength  Data Reviewed: I have independently reviewed following labs and imaging studies   CBC Recent Labs  Lab 02/12/22 1655 02/13/22 1625 02/13/22 2106 02/14/22 1133 02/14/22 1330 02/14/22 2032 02/15/22 0130 02/15/22 5462  WBC 2.6* 3.0*   < > DISREGARD RESULTS.  SPECIMEN DRAWN AT INCORRECT TIME. 4.2 9.9 6.8 6.2  HGB 8.9* 7.8*   < > DISREGARD RESULTS.  SPECIMEN DRAWN AT INCORRECT TIME PER E. DIXON,RN AT 0272 02/14/2022 BY ZBEECH. 7.8* 7.9* 8.3* 8.5*  HCT 26.7* 23.8*   < > DISREGARD RESULTS.  SPECIMEN DRAWN AT INCORRECT TIME. 25.0* 23.7* 24.6* 25.3*  PLT 144 142*   < > DISREGARD RESULTS.  SPECIMEN DRAWN AT INCORRECT TIME. 145* 131* 123* 133*  MCV 84.0 88.1   < > DISREGARD RESULTS.  SPECIMEN DRAWN AT INCORRECT TIME. 91.6 87.8 87.2 87.5  MCH 28.0 28.9   < > DISREGARD RESULTS.  SPECIMEN DRAWN AT INCORRECT TIME. 28.6 29.3 29.4 29.4  MCHC 33.3 32.8   < > DISREGARD RESULTS.  SPECIMEN DRAWN AT INCORRECT TIME. 31.2 33.3 33.7 33.6  RDW 16.1* 16.6*   < > DISREGARD RESULTS.  SPECIMENT DRAWN AT INCORRECT TIME. 16.4* 16.5* 16.3* 16.5*  LYMPHSABS 364* 0.4*  --   --    --   --   --   --   MONOABS  --  0.3  --   --   --   --   --   --   EOSABS 0* 0.0  --   --   --   --   --   --   BASOSABS 10 0.0  --   --   --   --   --   --    < > = values in this interval not displayed.     Recent Labs  Lab 02/12/22 1655 02/13/22 1505 02/13/22 1625 02/14/22 0245 02/14/22 1241 02/15/22 0130  NA 140  --  136  --  138 137  K 4.2  --  3.4*  --  3.4* 3.8  CL 98  --  100  --  105 103  CO2 23  --  27  --  23 26  GLUCOSE 97  --  102*  --  89 153*  BUN 85*  --  68*  --  49* 43*  CREATININE 1.34*  --  1.32*  --  1.42* 1.40*  CALCIUM 9.2  --  8.5*  --  7.9* 8.0*  AST 22  --  21  --  25 33  ALT 11  --  14  --  12 14  ALKPHOS  --   --  36*  --  35* 42  BILITOT 0.4  --  0.6  --  1.8* 1.1  ALBUMIN  --   --  3.0*  --  2.6* 2.6*  MG  --   --   --  2.1  --  1.9  LATICACIDVEN  --  1.2  --   --   --   --   INR  --   --  1.2  --   --   --   TSH  --   --  1.835  --   --   --      ------------------------------------------------------------------------------------------------------------------ No results for input(s): "CHOL", "HDL", "LDLCALC", "TRIG", "CHOLHDL", "LDLDIRECT" in the last 72 hours.  Lab Results  Component Value Date   HGBA1C 4.6 (L) 02/09/2021   ------------------------------------------------------------------------------------------------------------------ Recent Labs    02/13/22 1625  TSH 1.835     Cardiac Enzymes No results for input(s): "CKMB", "TROPONINI", "MYOGLOBIN" in the last 168 hours.  Invalid input(s): "CK" ------------------------------------------------------------------------------------------------------------------    Component Value Date/Time   BNP 210.7 (H) 08/08/2021  1049    CBG: No results for input(s): "GLUCAP" in the last 168 hours.  Recent Results (from the past 240 hour(s))  SARS Coronavirus 2 by RT PCR (hospital order, performed in [facility] hospital lab) *cepheid single result test* Anterior Nasal Swab      Status: Abnormal   Collection Time: 02/13/22  4:04 PM   Specimen: Anterior Nasal Swab  Result Value Ref Range Status   SARS Coronavirus 2 by RT PCR POSITIVE (A) NEGATIVE Final    Comment: (NOTE) SARS-CoV-2 target nucleic acids are DETECTED  SARS-CoV-2 RNA is generally detectable in upper respiratory specimens  during the acute phase of infection.  Positive results are indicative  of the presence of the identified virus, but do not rule out bacterial infection or co-infection with other pathogens not detected by the test.  Clinical correlation with patient history and  other diagnostic information is necessary to determine patient infection status.  The expected result is negative.  Fact Sheet for Patients:   https://www.patel.info/   Fact Sheet for Healthcare Providers:   https://hall.com/    This test is not yet approved or cleared by the [place] FDA and  has been authorized for detection and/or diagnosis of SARS-CoV-2 by FDA under an Emergency Use Authorization (EUA).  This EUA will remain in effect (meaning this test can be used) for the duration of  the COVID-19 declaration under Section 564(b)(1)  of the Act, 21 U.S.C. section 360-bbb-3(b)(1), unless the authorization is terminated or revoked sooner.   Performed at [facility], [place] [address] [place], [place] [address]   Culture, blood (Routine X 2) w Reflex to ID Panel     Status: None (Preliminary result)   Collection Time: 02/14/22 12:58 PM   Specimen: BLOOD  Result Value Ref Range Status   Specimen Description BLOOD  Final   Special Requests   Final    RED [id number] DONOR [id number] Performed at [facility], [place] [address], [place], [place] [address]    Culture PENDING  Incomplete   Report Status PENDING  Incomplete  Surgical PCR screen     Status: Abnormal   Collection Time: 02/14/22 10:28 PM   Specimen: Nasal Mucosa; Nasal Swab  Result Value  Ref Range Status   MRSA, PCR NEGATIVE NEGATIVE Final   Staphylococcus aureus POSITIVE (A) NEGATIVE Final    Comment: (NOTE) The Xpert SA Assay (FDA approved for NASAL specimens in patients 38 years of age and older), is one component of a comprehensive surveillance program. It is not intended to diagnose infection nor to guide or monitor treatment. Performed at [facility], [place] [address], [place], [place] [address]      Radiology Studies: DG CHEST PORT 1 VIEW  Result Date: 02/14/2022 CLINICAL DATA:  Hypoxemia.  COVID positive. EXAM: PORTABLE CHEST 1 VIEW COMPARISON:  Chest two views 02/13/2022 FINDINGS: Left chest wall cardiac AICD with leads overlying the right atrium and right ventricle. Cardiac silhouette is again mildly enlarged. Mediastinal contours are within normal limits. Moderate calcification within the aortic arch. Unchanged chronic mild bilateral interstitial thickening. Small left pleural effusion with left basilar heterogeneous airspace opacity, similar to 02/13/2022. No pneumothorax is seen. Mild multilevel degenerative disc changes of the thoracic spine. Chronic sclerotic focus within the inferior medial left humeral head appears unchanged from multiple prior radiographs including 02/24/2019, benign. IMPRESSION: 1. Small left pleural effusion with left basilar heterogeneous airspace opacity, similar to 02/13/2022. This is suspicious for  atelectasis versus pneumonia. 2. Unchanged mild cardiomegaly. Electronically Signed   By: Yvonne Kendall M.D.   On: 02/14/2022 11:56     Marzetta Board, MD, PhD [facility]  Between 7 am - 7 pm I am available, please contact me via Amion (for emergencies) or Securechat (non urgent messages)  Between 7 pm - 7 am I am not available, please contact night coverage MD/APP via Amion

## 2022-02-15 NOTE — Progress Notes (Signed)
Mobility Specialist: Progress Note   02/15/22 1644  Mobility  Activity Ambulated with assistance in hallway  Level of Assistance Minimal assist, patient does 75% or more  Assistive Device Front wheel walker  Distance Ambulated (ft) 50 ft  Activity Response Tolerated well  $Mobility charge 1 Mobility   Pt received in the bed and agreeable to mobility. Mod I with bed mobility and minA to stand. Pt c/o mild nausea and dizziness during ambulation but states she feels better overall this session compared to this morning. Pt sitting EOB after session with call bell in reach. Pt requiring assistance with pericare and requesting female staff, RN notified.   Holland Delmo Matty Mobility Specialist Secure Chat Only

## 2022-02-15 NOTE — Interval H&P Note (Signed)
History and Physical Interval Note:  02/15/2022 10:09 AM  [name]  has presented today for surgery, with the diagnosis of gastrointestinal bleeding.  The various methods of treatment have been discussed with the patient and family. After consideration of risks, benefits and other options for treatment, the patient has consented to  Procedure(s): ENTEROSCOPY (N/A) as a surgical intervention.  The patient's history has been reviewed, patient examined, no change in status, stable for surgery.  I have reviewed the patient's chart and labs.  Questions were answered to the patient's satisfaction.     Sharyn Creamer

## 2022-02-16 ENCOUNTER — Inpatient Hospital Stay (HOSPITAL_COMMUNITY): Payer: PPO

## 2022-02-16 DIAGNOSIS — D62 Acute posthemorrhagic anemia: Secondary | ICD-10-CM | POA: Diagnosis not present

## 2022-02-16 DIAGNOSIS — K921 Melena: Secondary | ICD-10-CM | POA: Diagnosis not present

## 2022-02-16 DIAGNOSIS — K922 Gastrointestinal hemorrhage, unspecified: Secondary | ICD-10-CM | POA: Diagnosis not present

## 2022-02-16 HISTORY — PX: IR EMBO ART  VEN HEMORR LYMPH EXTRAV  INC GUIDE ROADMAPPING: IMG5450

## 2022-02-16 HISTORY — PX: IR ANGIOGRAM VISCERAL SELECTIVE: IMG657

## 2022-02-16 HISTORY — PX: IR US GUIDE VASC ACCESS RIGHT: IMG2390

## 2022-02-16 LAB — BPAM RBC
Blood Product Expiration Date: 202310262359
Blood Product Expiration Date: 202310282359
ISSUE DATE / TIME: 202310120829
Unit Type and Rh: 600
Unit Type and Rh: 600

## 2022-02-16 LAB — HEMOGLOBIN AND HEMATOCRIT, BLOOD
HCT: 22.2 % — ABNORMAL LOW (ref 36.0–46.0)
Hemoglobin: 7 g/dL — ABNORMAL LOW (ref 12.0–15.0)

## 2022-02-16 LAB — TYPE AND SCREEN
ABO/RH(D): A NEG
Antibody Screen: POSITIVE
Donor AG Type: NEGATIVE
Donor AG Type: NEGATIVE
Unit division: 0
Unit division: 0

## 2022-02-16 LAB — COMPREHENSIVE METABOLIC PANEL
ALT: 16 U/L (ref 0–44)
AST: 28 U/L (ref 15–41)
Albumin: 2.5 g/dL — ABNORMAL LOW (ref 3.5–5.0)
Alkaline Phosphatase: 32 U/L — ABNORMAL LOW (ref 38–126)
Anion gap: 6 (ref 5–15)
BUN: 46 mg/dL — ABNORMAL HIGH (ref 8–23)
CO2: 25 mmol/L (ref 22–32)
Calcium: 8 mg/dL — ABNORMAL LOW (ref 8.9–10.3)
Chloride: 104 mmol/L (ref 98–111)
Creatinine, Ser: 1.33 mg/dL — ABNORMAL HIGH (ref 0.44–1.00)
GFR, Estimated: 39 mL/min — ABNORMAL LOW (ref >60)
Glucose, Bld: 144 mg/dL — ABNORMAL HIGH (ref 70–99)
Potassium: 3.7 mmol/L (ref 3.5–5.1)
Sodium: 135 mmol/L (ref 135–145)
Total Bilirubin: 0.6 mg/dL (ref 0.3–1.2)
Total Protein: 4.9 g/dL — ABNORMAL LOW (ref 6.5–8.1)

## 2022-02-16 LAB — CBC
HCT: 21.6 % — ABNORMAL LOW (ref 36.0–46.0)
Hemoglobin: 7.1 g/dL — ABNORMAL LOW (ref 12.0–15.0)
MCH: 28.6 pg (ref 26.0–34.0)
MCHC: 32.9 g/dL (ref 30.0–36.0)
MCV: 87.1 fL (ref 80.0–100.0)
Platelets: 164 10*3/uL (ref 150–400)
RBC: 2.48 MIL/uL — ABNORMAL LOW (ref 3.87–5.11)
RDW: 16.5 % — ABNORMAL HIGH (ref 11.5–15.5)
WBC: 6.9 10*3/uL (ref 4.0–10.5)
nRBC: 1.2 % — ABNORMAL HIGH (ref 0.0–0.2)

## 2022-02-16 LAB — PREPARE RBC (CROSSMATCH)

## 2022-02-16 LAB — GLUCOSE, CAPILLARY: Glucose-Capillary: 97 mg/dL (ref 70–99)

## 2022-02-16 LAB — MAGNESIUM: Magnesium: 2 mg/dL (ref 1.7–2.4)

## 2022-02-16 LAB — MRSA NEXT GEN BY PCR, NASAL: MRSA by PCR Next Gen: NOT DETECTED

## 2022-02-16 MED ORDER — ACETAMINOPHEN 325 MG PO TABS
650.0000 mg | ORAL_TABLET | Freq: Once | ORAL | Status: DC
  Filled 2022-02-16: qty 2

## 2022-02-16 MED ORDER — FENTANYL CITRATE (PF) 100 MCG/2ML IJ SOLN
INTRAMUSCULAR | Status: AC
  Filled 2022-02-16: qty 4

## 2022-02-16 MED ORDER — CHLORHEXIDINE GLUCONATE CLOTH 2 % EX PADS
6.0000 | MEDICATED_PAD | Freq: Every day | CUTANEOUS | Status: DC
  Administered 2022-02-16 – 2022-02-25 (×8): 6 via TOPICAL

## 2022-02-16 MED ORDER — ORAL CARE MOUTH RINSE: 15.0000 mL | OROMUCOSAL | Status: DC | PRN

## 2022-02-16 MED ORDER — SODIUM CHLORIDE 0.9 % IV SOLN
510.0000 mg | Freq: Once | INTRAVENOUS | Status: AC
  Administered 2022-02-16: 510 mg via INTRAVENOUS
  Filled 2022-02-16: qty 17

## 2022-02-16 MED ORDER — FENTANYL CITRATE (PF) 100 MCG/2ML IJ SOLN
INTRAMUSCULAR | Status: AC | PRN
  Administered 2022-02-16: 12.5 ug via INTRAVENOUS

## 2022-02-16 MED ORDER — IOHEXOL 300 MG/ML  SOLN
100.0000 mL | Freq: Once | INTRAMUSCULAR | Status: AC | PRN
  Administered 2022-02-16: 85 mL via INTRA_ARTERIAL

## 2022-02-16 MED ORDER — DIPHENHYDRAMINE HCL 50 MG/ML IJ SOLN
25.0000 mg | Freq: Once | INTRAMUSCULAR | Status: DC
  Filled 2022-02-16: qty 1

## 2022-02-16 MED ORDER — LIDOCAINE HCL 1 % IJ SOLN
INTRAMUSCULAR | Status: AC | PRN
  Administered 2022-02-16: 3 mL

## 2022-02-16 MED ORDER — IOHEXOL 300 MG/ML  SOLN
150.0000 mL | Freq: Once | INTRAMUSCULAR | Status: AC | PRN
  Administered 2022-02-16: 100 mL via INTRA_ARTERIAL

## 2022-02-16 MED ORDER — IOHEXOL 300 MG/ML  SOLN
50.0000 mL | Freq: Once | INTRAMUSCULAR | Status: AC | PRN
  Administered 2022-02-16: 40 mL via INTRA_ARTERIAL

## 2022-02-16 MED ORDER — MIDAZOLAM HCL 2 MG/2ML IJ SOLN
INTRAMUSCULAR | Status: AC
  Filled 2022-02-16: qty 4

## 2022-02-16 MED ORDER — LIDOCAINE HCL 1 % IJ SOLN
INTRAMUSCULAR | Status: AC
  Filled 2022-02-16: qty 20

## 2022-02-16 MED ORDER — LACTATED RINGERS IV BOLUS: 500.0000 mL | Freq: Once | INTRAVENOUS | Status: DC | PRN

## 2022-02-16 MED ORDER — SODIUM CHLORIDE 0.9% IV SOLUTION: Freq: Once | INTRAVENOUS | Status: DC

## 2022-02-16 MED ORDER — IOHEXOL 350 MG/ML SOLN
80.0000 mL | Freq: Once | INTRAVENOUS | Status: AC | PRN
  Administered 2022-02-16: 80 mL via INTRAVENOUS

## 2022-02-16 MED ORDER — EPINEPHRINE HCL 5 MG/250ML IV SOLN IN NS
0.5000 ug/min | INTRAVENOUS | Status: DC
  Filled 2022-02-16: qty 250

## 2022-02-16 MED ORDER — METHYLPREDNISOLONE SODIUM SUCC 40 MG IJ SOLR
40.0000 mg | Freq: Once | INTRAMUSCULAR | Status: AC | PRN
  Administered 2022-02-17: 40 mg via INTRAVENOUS
  Filled 2022-02-16: qty 1

## 2022-02-16 MED ORDER — IOHEXOL 300 MG/ML  SOLN
50.0000 mL | Freq: Once | INTRAMUSCULAR | Status: AC | PRN
  Administered 2022-02-16: 10 mL via INTRA_ARTERIAL

## 2022-02-16 NOTE — Procedures (Signed)
Interventional Radiology Procedure Note  Procedure:  1) SMA and celiac angiography 2) Pancreacticoduodenal arcade coil embolization 3) Gastroduodenal artery coil embolization 4) Right common femoral artery angioseal closure  Findings: Please refer to procedural dictation for full description. Hyperenhancing pancreatic head mass with supply from inferior pancreaticoduodenal arcade branches and superior supply from GDA. No evidence of active intraluminal extravasation.  Respective vessels coil embolized.    Complications: None immediate  Estimated Blood Loss: 10 mL  Recommendations: Strict 4 hour bedrest (2 hours flat until 23:00, 2 hours head of bed up to 30 degrees 23:00-01:00). Continue to trend H/H, resuscitate as needed as per ICU. Expect melena to diminish over the next 24-48 hrs.  If it continues or worsens, consider tagged RBC nuclear medicine study to identify source of hemorrhage. IR will continue to follow.   Ruthann Cancer, MD Pager: [phone]

## 2022-02-16 NOTE — Progress Notes (Addendum)
PROGRESS NOTE  [name] EPP:[id number] DOB: 07/31/1933 DOA: 02/13/2022 PCP: Ria Bush, MD   LOS: 3 days   Brief Narrative / Interim history: 88 year old female with A-fib on Eliquis, prior history of GI bleeds due to AVMs, hypertension, anemia, comes to the hospital with complaints of melena.  She was diagnosed with COVID on 10/5 (symptoms started 10/3), received antivirals and steroids with improvement in her symptoms.  Few days ago, she has been starting to have melena and became extremely weak and came to the hospital.  Significant events: 10/11-admit to hospital 10/12-significant blood transfusion reaction with fever, hypotension, rigors 10/14-ongoing tarry stools  Significant imaging / results / micro data: Chest x-ray 10/11-small bilateral pleural effusions 10/11-SARS-CoV-2 positive  Subjective / 24h Interval events: She appears anxious this morning, has been having black stools overnight  Assesement and Plan: Principal Problem:   Upper GI bleed Active Problems:   Hypothyroidism   Mixed hyperlipidemia   Permanent atrial fibrillation   COPD (chronic obstructive pulmonary disease) (HCC)   Chronic kidney disease, stage 3b (HCC)   ICD (implantable cardioverter-defibrillator) in place   Hypertension   Chronic diastolic CHF (congestive heart failure) (HCC)   Anticoagulated   Hypokalemia   Symptomatic anemia   COVID-19 virus infection   GI bleed   Principal problem Upper GI bleed-gastroenterology consulted, appreciate input.  She has known gastric and small bowel AVMs.  She has been placed on PPI, Eliquis has been held for the past couple of days by patient.  -Underwent EGD 10/13, found to have 2 nonbleeding angiectasis in the duodenum, status post APC. -Still having tarry bowel movements, discussed with Dr. Loletha Carrow with gastroenterology, obtain CT angiogram with potential IR consult later on  Active problems Acute blood loss anemia-hemoglobin down to 7.2,  from 11 a few months ago.  Received partially blood 10/12, developed transfusion reaction -Unfortunately she continues to have bloody bowel movements, hemoglobin is down to 7.1.  Will transfuse if needed with close monitoring, perhaps slow transfusion rate and pause at certain intervals to watch  Transfusion reaction-on 10/12, after receiving about 200 mL of blood.  Blood bank notified.  Patient experienced initially chills, elevated blood pressure, tachycardia as well as hypoxia, had cyanotic lips requiring 10 L of oxygen to maintain sats above 90%.  Chest x-ray did not show any acute findings.  Later on she developed a fever to 102, hypotension, and there was concern for infected unit, and started on antibiotics. -Of note, she was premedicated with Benadryl, Tylenol, and she also received steroids following the reaction  COVID-19-seems to be resolving.  Original diagnosis was 8 days ago on 10/5.  She will need to be on isolation for total of 10 days, until 10/15  Paroxysmal A-fib on Eliquis-currently Eliquis is on hold.  She does not appear to be on rate limiting agents at home.  Continue to monitor on telemetry  ?Hypothyroidism -mentioned in the H&P, has been placed on Synthroid but I do not see this medication on her home regimen. Discussed with her daughter, she is not hypothyroid nor takes synthroid  Lab Results  Component Value Date   TSH 1.835 02/13/2022   ICD in place-noted  Essential hypertension-hold antihypertensives in the setting of GI bleed  Hyperlipidemia-continue statin  Chronic diastolic CHF-euvolemic this morning  CKD 3B-baseline creatinine ranging anywhere from 1.3-1.7, currently at baseline.  Continue to monitor  COPD-chronic, stable, no wheezing  Scheduled Meds:  sodium chloride   Intravenous Once   ezetimibe  10 mg  Oral Daily   levothyroxine  25 mcg Oral Q0600   pantoprazole  40 mg Oral BID   rosuvastatin  40 mg Oral Daily   Continuous Infusions:   PRN  Meds:.acetaminophen **OR** acetaminophen, albuterol, guaiFENesin-dextromethorphan, ondansetron (ZOFRAN) IV  Current Outpatient Medications  Medication Instructions   acetaminophen (TYLENOL) 500-1,000 mg, Oral, Every 8 hours PRN   albuterol (VENTOLIN HFA) 108 (90 Base) MCG/ACT inhaler 2 puffs, Inhalation, Every 6 hours PRN   clobetasol cream (TEMOVATE) 0.05 % Apply to affected area left foot twice a day until improved. Avoid face, groin, axilla.   Cyanocobalamin (B-12 PO) 1 tablet, Oral, Daily   ELIQUIS 2.5 MG TABS tablet TAKE 1 TABLET BY MOUTH TWICE A DAY   fluticasone (FLONASE) 50 MCG/ACT nasal spray 2 sprays, Each Nare, Daily PRN   metolazone (ZAROXOLYN) 2.5 mg, Oral, As directed, Take every other Thursday with your morning dose of Torsemide   octreotide (SANDOSTATIN LAR) 10 mg, Intramuscular, Every 28 days   potassium chloride SA (KLOR-CON M) 20 MEQ tablet TAKE 2 TABLETS (40 MEQ) BY MOUTH 2 TIMESDAILY. TAKE 1 EXTRA TABLET ON THE DAYS YOU TAKE METOLAZONE.   torsemide (DEMADEX) 100 MG tablet TAKE 1 TABLET BY MOUTH EVERY MORNING (EVENING DOSE 80 MG)   vitamin C (ASCORBIC ACID) 500 mg, Oral, Daily   VITAMIN D PO 4,000 Units, Oral, Daily    Diet Orders (From admission, onward)     Start     Ordered   02/15/22 1114  Diet clear liquid Room service appropriate? Yes; Fluid consistency: Thin  Diet effective now       Question Answer Comment  Room service appropriate? Yes   Fluid consistency: Thin      02/15/22 1113            DVT prophylaxis: SCDs Start: 02/13/22 1923   Lab Results  Component Value Date   PLT 164 02/16/2022      Code Status: Full Code  Family Communication: Daughter present at bedside  Status is: Inpatient Remains inpatient appropriate because: Severity of illness   Level of care: Progressive  Consultants:  GI  Objective: Vitals:   02/15/22 2232 02/15/22 2351 02/16/22 0623 02/16/22 0808  BP: 125/84 (!) 139/50 (!) 108/52 (!) 148/57  Pulse: 63 (!) 59  65 60  Resp: (!) 22   17  Temp: 97.6 F (36.4 C) 97.9 F (36.6 C) 97.7 F (36.5 C) 98 F (36.7 C)  TempSrc: Oral Oral Oral Oral  SpO2: 97% 99%  100%  Weight:      Height:        Intake/Output Summary (Last 24 hours) at 02/16/2022 1131 Last data filed at 02/15/2022 1529 Gross per 24 hour  Intake 2435.49 ml  Output --  Net 2435.49 ml    Wt Readings from Last 3 Encounters:  02/13/22 64.4 kg  02/12/22 64.4 kg  01/09/22 66.4 kg    Examination:  Constitutional: NAD Eyes: lids and conjunctivae normal, no scleral icterus ENMT: mmm Neck: normal, supple Respiratory: clear to auscultation bilaterally, no wheezing, no crackles. Normal respiratory effort.  Cardiovascular: Regular rate and rhythm, no murmurs / rubs / gallops. Abdomen: soft, no distention, no tenderness. Bowel sounds positive.  Skin: no rashes Neurologic: no focal deficits, equal strength  Data Reviewed: I have independently reviewed following labs and imaging studies   CBC Recent Labs  Lab 02/12/22 1655 02/13/22 1625 02/13/22 2106 02/14/22 2032 02/15/22 0130 02/15/22 0728 02/15/22 1848 02/16/22 0454  WBC 2.6* 3.0*   < >  9.9 6.8 6.2 5.5 6.9  HGB 8.9* 7.8*   < > 7.9* 8.3* 8.5* 7.6* 7.1*  HCT 26.7* 23.8*   < > 23.7* 24.6* 25.3* 24.0* 21.6*  PLT 144 142*   < > 131* 123* 133* 161 164  MCV 84.0 88.1   < > 87.8 87.2 87.5 89.6 87.1  MCH 28.0 28.9   < > 29.3 29.4 29.4 28.4 28.6  MCHC 33.3 32.8   < > 33.3 33.7 33.6 31.7 32.9  RDW 16.1* 16.6*   < > 16.5* 16.3* 16.5* 16.6* 16.5*  LYMPHSABS 364* 0.4*  --   --   --   --   --   --   MONOABS  --  0.3  --   --   --   --   --   --   EOSABS 0* 0.0  --   --   --   --   --   --   BASOSABS 10 0.0  --   --   --   --   --   --    < > = values in this interval not displayed.     Recent Labs  Lab 02/12/22 1655 02/13/22 1505 02/13/22 1625 02/14/22 0245 02/14/22 1241 02/15/22 0130 02/16/22 0454  NA 140  --  136  --  138 137 135  K 4.2  --  3.4*  --  3.4* 3.8 3.7   CL 98  --  100  --  105 103 104  CO2 23  --  27  --  '23 26 25  '$ GLUCOSE 97  --  102*  --  89 153* 144*  BUN 85*  --  68*  --  49* 43* 46*  CREATININE 1.34*  --  1.32*  --  1.42* 1.40* 1.33*  CALCIUM 9.2  --  8.5*  --  7.9* 8.0* 8.0*  AST 22  --  21  --  25 33 28  ALT 11  --  14  --  '12 14 16  '$ ALKPHOS  --   --  36*  --  35* 42 32*  BILITOT 0.4  --  0.6  --  1.8* 1.1 0.6  ALBUMIN  --   --  3.0*  --  2.6* 2.6* 2.5*  MG  --   --   --  2.1  --  1.9 2.0  LATICACIDVEN  --  1.2  --   --   --   --   --   INR  --   --  1.2  --   --   --   --   TSH  --   --  1.835  --   --   --   --      ------------------------------------------------------------------------------------------------------------------ No results for input(s): "CHOL", "HDL", "LDLCALC", "TRIG", "CHOLHDL", "LDLDIRECT" in the last 72 hours.  Lab Results  Component Value Date   HGBA1C 4.6 (L) 02/09/2021   ------------------------------------------------------------------------------------------------------------------ Recent Labs    02/13/22 1625  TSH 1.835     Cardiac Enzymes No results for input(s): "CKMB", "TROPONINI", "MYOGLOBIN" in the last 168 hours.  Invalid input(s): "CK" ------------------------------------------------------------------------------------------------------------------    Component Value Date/Time   BNP 210.7 (H) 08/08/2021 1049    CBG: No results for input(s): "GLUCAP" in the last 168 hours.  Recent Results (from the past 240 hour(s))  SARS Coronavirus 2 by RT PCR (hospital order, performed in [facility] hospital lab) *cepheid single result test* Anterior Nasal Swab  Status: Abnormal   Collection Time: 02/13/22  4:04 PM   Specimen: Anterior Nasal Swab  Result Value Ref Range Status   SARS Coronavirus 2 by RT PCR POSITIVE (A) NEGATIVE Final    Comment: (NOTE) SARS-CoV-2 target nucleic acids are DETECTED  SARS-CoV-2 RNA is generally detectable in upper respiratory specimens  during  the acute phase of infection.  Positive results are indicative  of the presence of the identified virus, but do not rule out bacterial infection or co-infection with other pathogens not detected by the test.  Clinical correlation with patient history and  other diagnostic information is necessary to determine patient infection status.  The expected result is negative.  Fact Sheet for Patients:   https://www.patel.info/   Fact Sheet for Healthcare Providers:   https://hall.com/    This test is not yet approved or cleared by the [place] FDA and  has been authorized for detection and/or diagnosis of SARS-CoV-2 by FDA under an Emergency Use Authorization (EUA).  This EUA will remain in effect (meaning this test can be used) for the duration of  the COVID-19 declaration under Section 564(b)(1)  of the Act, 21 U.S.C. section 360-bbb-3(b)(1), unless the authorization is terminated or revoked sooner.   Performed at [facility], [place] [address], [place], [place] [address]   Culture, blood (Routine X 2) w Reflex to ID Panel     Status: None (Preliminary result)   Collection Time: 02/14/22 12:58 PM   Specimen: BLOOD  Result Value Ref Range Status   Specimen Description BLOOD  Final   Special Requests RED [id number] DONOR [id number]  Final   Culture   Final    NO GROWTH < 24 HOURS Performed at [facility], [place] [address], [place], [place] [address]    Report Status PENDING  Incomplete  Surgical PCR screen     Status: Abnormal   Collection Time: 02/14/22 10:28 PM   Specimen: Nasal Mucosa; Nasal Swab  Result Value Ref Range Status   MRSA, PCR NEGATIVE NEGATIVE Final   Staphylococcus aureus POSITIVE (A) NEGATIVE Final    Comment: (NOTE) The Xpert SA Assay (FDA approved for NASAL specimens in patients 9 years of age and older), is one component of a comprehensive surveillance program. It is not intended to diagnose  infection nor to guide or monitor treatment. Performed at [facility], [place] [address] [place], [place] [address]      Radiology Studies: ECHOCARDIOGRAM COMPLETE  Result Date: 02/15/2022    ECHOCARDIOGRAM REPORT   Patient Name:   [name] Date of Exam: 02/15/2022 Medical Rec #:  [id number]         Height:       66.0 in Accession #:    [id number]        Weight:       142.0 lb Date of Birth:  1933-08-22         BSA:          1.729 m Patient Age:    70 years          BP:           125/52 mmHg Patient Gender: F                 HR:           52 bpm. Exam Location:  Inpatient Procedure: 2D Echo, Cardiac Doppler and Color Doppler Indications:    Elevated troponin  History:  Patient has prior history of Echocardiogram examinations, most                 recent 06/27/2021. CHF; CAD.  Sonographer:    Jefferey Pica Referring Phys: Miles  1. Left ventricular ejection fraction, by estimation, is 60 to 65%. The left ventricle has normal function. The left ventricle has no regional wall motion abnormalities. There is moderate concentric left ventricular hypertrophy. Left ventricular diastolic parameters are consistent with Grade II diastolic dysfunction (pseudonormalization). There is the interventricular septum is flattened in systole and diastole, consistent with right ventricular pressure and volume overload.  2. Right ventricular systolic function is normal. The right ventricular size is mildly enlarged. There is severely elevated pulmonary artery systolic pressure.  3. Left atrial size was moderately dilated.  4. Right atrial size was moderately dilated.  5. A small pericardial effusion is present. Large pleural effusion.  6. Mild mitral valve regurgitation.  7. Tricuspid valve regurgitation is moderate to severe.  8. The aortic valve is calcified. Aortic valve regurgitation is mild. Mild aortic valve stenosis.  9. The inferior vena cava is dilated in size with <50%  respiratory variability, suggesting right atrial pressure of 15 mmHg. Comparison(s): No significant change from prior study. FINDINGS  Left Ventricle: Left ventricular ejection fraction, by estimation, is 60 to 65%. The left ventricle has normal function. The left ventricle has no regional wall motion abnormalities. The left ventricular internal cavity size was normal in size. There is  moderate concentric left ventricular hypertrophy. Abnormal (paradoxical) septal motion, consistent with RV pacemaker and the interventricular septum is flattened in systole and diastole, consistent with right ventricular pressure and volume overload. Left ventricular diastolic parameters are consistent with Grade II diastolic dysfunction (pseudonormalization). Right Ventricle: The right ventricular size is mildly enlarged. Right ventricular systolic function is normal. There is severely elevated pulmonary artery systolic pressure. The tricuspid regurgitant velocity is 4.32 m/s, and with an assumed right atrial  pressure of 15 mmHg, the estimated right ventricular systolic pressure is 75.6 mmHg. Left Atrium: Left atrial size was moderately dilated. Right Atrium: Right atrial size was moderately dilated. Pericardium: A small pericardial effusion is present. Mitral Valve: Mild mitral valve regurgitation. MV peak gradient, 20.4 mmHg. The mean mitral valve gradient is 5.0 mmHg. Tricuspid Valve: Tricuspid valve regurgitation is moderate to severe. Aortic Valve: The aortic valve is calcified. Aortic valve regurgitation is mild. Aortic regurgitation PHT measures 337 msec. Mild aortic stenosis is present. Aortic valve peak gradient measures 12.5 mmHg. Pulmonic Valve: Pulmonic valve regurgitation is trivial. Aorta: The aortic root and ascending aorta are structurally normal, with no evidence of dilitation. Venous: The inferior vena cava is dilated in size with less than 50% respiratory variability, suggesting right atrial pressure of 15 mmHg.  IAS/Shunts: No atrial level shunt detected by color flow Doppler. Additional Comments: A device lead is visualized. There is a large pleural effusion.  LEFT VENTRICLE PLAX 2D LVIDd:         3.80 cm LVIDs:         2.60 cm LV PW:         1.45 cm LV IVS:        1.15 cm LVOT diam:     1.80 cm LV SV:         70 LV SV Index:   40 LVOT Area:     2.54 cm  RIGHT VENTRICLE             IVC  RV Basal diam:  2.90 cm     IVC diam: 2.10 cm RV S prime:     10.80 cm/s TAPSE (M-mode): 1.6 cm LEFT ATRIUM             Index        RIGHT ATRIUM           Index LA diam:        4.60 cm 2.66 cm/m   RA Area:     29.60 cm LA Vol (A2C):   62.1 ml 35.92 ml/m  RA Volume:   116.00 ml 67.09 ml/m LA Vol (A4C):   67.2 ml 38.87 ml/m LA Biplane Vol: 69.1 ml 39.97 ml/m  AORTIC VALVE                 PULMONIC VALVE AV Area (Vmax): 1.73 cm     PV Vmax:       1.03 m/s AV Vmax:        176.50 cm/s  PV Peak grad:  4.2 mmHg AV Peak Grad:   12.5 mmHg LVOT Vmax:      120.00 cm/s LVOT Vmean:     75.400 cm/s LVOT VTI:       0.274 m AI PHT:         337 msec  AORTA Ao Root diam: 2.90 cm Ao Asc diam:  2.90 cm MITRAL VALVE                TRICUSPID VALVE MV Area (PHT): 4.41 cm     TR Peak grad:   74.6 mmHg MV Area VTI:   1.22 cm     TR Vmax:        432.00 cm/s MV Peak grad:  20.4 mmHg MV Mean grad:  5.0 mmHg     SHUNTS MV Vmax:       2.26 m/s     Systemic VTI:  0.27 m MV Vmean:      90.6 cm/s    Systemic Diam: 1.80 cm MV Decel Time: 172 msec MV E velocity: 185.00 cm/s MV A velocity: 75.80 cm/s MV E/A ratio:  2.44 Landscape architect signed by Phineas Inches Signature Date/Time: 02/15/2022/3:01:50 PM    Final      Marzetta Board, MD, PhD [facility]  Between 7 am - 7 pm I am available, please contact me via Amion (for emergencies) or Securechat (non urgent messages)  Between 7 pm - 7 am I am not available, please contact night coverage MD/APP via Amion

## 2022-02-16 NOTE — Progress Notes (Signed)
I have had multiple communications with Dr. Cruzita Lederer regarding this patient throughout the day while coordinating her work-up and care. Earlier this afternoon I spoke with her daughter at the bedside just after her mother had gone down for the CT angiogram.  At that point I gave her an update on our limited options for diagnosis and management of the small bowel bleeding.  I spoke with Dr. Serafina Royals of IR both before and after the CTA was done.  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  He and I both have concerns there could be some pseudoaneurysmal element of this that is intermittently bleeding into the proximal small bowel.  If that is so, it could be a very small fistulous type opening that would not be necessarily discovered on endoscopy such as yesterday's procedure with Dr. Lorenso Courier, especially if there is not active bleeding at the time.  We will follow along, though at this point I believe her further management is in the hands of our interventional radiology colleagues, and [name] is clearly at high risk candidate for any operative intervention given her age and both acute and chronic comorbidities.  Wilfrid Lund, MD

## 2022-02-16 NOTE — Progress Notes (Signed)
[facility] Progress Note  Chief Complaint: Melena and anemia  History: Signout received from colleagues, extensive chart review performed, patient interviewed and examined.  2 nonbleeding duodenal AVMs cauterized with APC yesterday.  However, this patient has continued to pass melena all night and her hemoglobin is dropping.  She feels terrible today and looks unwell.  Denies abdominal pain or chest pain, but says she is short of breath. Hospitalist has seen her and ordered IV iron.  Would like to give her transfusions but patient is understandably reluctant since she had transfusion reaction this admission and 2 years ago.  ROS: As above Objective:   Current Facility-Administered Medications:    0.9 %  sodium chloride infusion (Manually program via Guardrails IV Fluids), , Intravenous, Once, Caren Griffins, MD, Held at 02/14/22 0825   acetaminophen (TYLENOL) tablet 650 mg, 650 mg, Oral, Q6H PRN **OR** acetaminophen (TYLENOL) suppository 650 mg, 650 mg, Rectal, Q6H PRN, Doutova, Anastassia, MD   albuterol (PROVENTIL) (2.5 MG/3ML) 0.083% nebulizer solution 2.5 mg, 2.5 mg, Nebulization, Q2H PRN, Doutova, Anastassia, MD   ezetimibe (ZETIA) tablet 10 mg, 10 mg, Oral, Daily, Doutova, Anastassia, MD, 10 mg at 02/16/22 0820   ferumoxytol (FERAHEME) 510 mg in sodium chloride 0.9 % 100 mL IVPB, 510 mg, Intravenous, Once, Gherghe, Costin M, MD   guaiFENesin-dextromethorphan (ROBITUSSIN DM) 100-10 MG/5ML syrup 5 mL, 5 mL, Oral, Q4H PRN, Cruzita Lederer, Costin M, MD   levothyroxine (SYNTHROID) tablet 25 mcg, 25 mcg, Oral, Q0600, Doutova, Anastassia, MD, 25 mcg at 02/16/22 0751   ondansetron (ZOFRAN) injection 4 mg, 4 mg, Intravenous, Q6H PRN, Doutova, Anastassia, MD, 4 mg at 02/15/22 2223   pantoprazole (PROTONIX) EC tablet 40 mg, 40 mg, Oral, BID, Sharyn Creamer, MD, 40 mg at 02/16/22 0820   rosuvastatin (CRESTOR) tablet 40 mg, 40 mg, Oral, Daily, Doutova, Anastassia, MD, 40 mg at 02/16/22 0820    ferumoxytol (FERAHEME) 510 mg in sodium chloride 0.9 % 100 mL IVPB       Vital signs in last 24 hrs: Vitals:   02/16/22 0623 02/16/22 0808  BP: (!) 108/52 (!) 148/57  Pulse: 65 60  Resp:  17  Temp: 97.7 F (36.5 C) 98 F (36.7 C)  SpO2:  100%    Intake/Output Summary (Last 24 hours) at 02/16/2022 0932 Last data filed at 02/15/2022 1529 Gross per 24 hour  Intake 2885.49 ml  Output 0 ml  Net 2885.49 ml     Physical Exam  Chronically ill-appearing, she clearly feels quite poorly today.  She is winded having just returned from the bathroom with the assistance of the nurse tech.  Wearing supplemental oxygen and dyspneic at rest. Neck: supple, no thyromegaly, JVD or lymphadenopathy Cardiac: RRR without murmurs, S1S2 heard, no peripheral edema Pulm: clear to auscultation bilaterally, normal RR and effort noted Abdomen: soft, no tenderness, with active bowel sounds. No guarding or palpable hepatosplenomegaly Skin; warm and dry, pale, no jaundice  Recent Labs:     Latest Ref Rng & Units 02/16/2022    4:54 AM 02/15/2022    6:48 PM 02/15/2022    7:28 AM  CBC  WBC 4.0 - 10.5 K/uL 6.9  5.5  6.2   Hemoglobin 12.0 - 15.0 g/dL 7.1  7.6  8.5   Hematocrit 36.0 - 46.0 % 21.6  24.0  25.3   Platelets 150 - 400 K/uL 164  161  133     Recent Labs  Lab 02/13/22 1625  INR 1.2  Latest Ref Rng & Units 02/16/2022    4:54 AM 02/15/2022    1:30 AM 02/14/2022   12:41 PM  CMP  Glucose 70 - 99 mg/dL 144  153  89   BUN 8 - 23 mg/dL 46  43  49   Creatinine 0.44 - 1.00 mg/dL 1.33  1.40  1.42   Sodium 135 - 145 mmol/L 135  137  138   Potassium 3.5 - 5.1 mmol/L 3.7  3.8  3.4   Chloride 98 - 111 mmol/L 104  103  105   CO2 22 - 32 mmol/L '25  26  23   '$ Calcium 8.9 - 10.3 mg/dL 8.0  8.0  7.9   Total Protein 6.5 - 8.1 g/dL 4.9  5.1  5.1   Total Bilirubin 0.3 - 1.2 mg/dL 0.6  1.1  1.8   Alkaline Phos 38 - 126 U/L 32  42  35   AST 15 - 41 U/L 28  33  25   ALT 0 - 44 U/L '16  14  12     '$ Small bowel enteroscopy report from yesterday reviewed  Radiologic studies:   Assessment & Plan  Assessment: Melena Acute on chronic blood loss anemia Long-term use anticoagulation for A-fib, last dose 4 days ago CKD, last creatinine 1.33 today   This patient is still bleeding, and from what we know of her, I think it is almost certainly from the small bowel.  It is clearly beyond the reach of push enteroscopy, and we do not have balloon enteroscopy at this institution. Interventional radiology assistance may be needed, though typically it is quite difficult for IR to localize and treat a small bowel source such as either AVMs or the apparent ulcers that were seen on an outpatient video capsule study in July.  She needs another video capsule study, but I am obtaining a CT angiogram first and will contact interventional radiology for an opinion.  IV iron given, clearly will take some time to improve the hemoglobin. I discussed the case with Dr.Gherghe, and we agree that although she has had a history of transfusion reactions, this ongoing bleeding of an obscure source that will be difficult to localize and treat puts her life in danger, and as such I think she needs to very seriously consider receiving transfusion with any concomitant precautions and premedications that may decrease the chance of a reaction.    40 minutes were spent on this encounter (including chart review, history/exam, counseling/coordination of care, and documentation) > 50% of that time was spent on counseling and coordination of care.   Nelida Meuse III Office: [phone]

## 2022-02-16 NOTE — Consult Note (Signed)
Chief Complaint: Patient was seen in consultation today for gastrointestinal hemorrhage  Referring Physician(s): Wilfrid Lund, MD  History of Present Illness: [name] is a 88 y.o. female with pertinent history of worsening upper gastrointestinal hemorrhage of indeterminate etiology.  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  She notes that in August she noticed increasing amounts of melena and large volume dark bloody stools.  This has required iron tranfusions.  She has continued to feel worse until admitted to the ED on 10/12 for the same.  She underwent upper endoscopy yesterday and two small AVMs were observed in her duodenum which were coagulated.  She has continued to have anemia requiring transfusion.  Unfortunately she has a history of transfusion reaction and suffered that again yesterday.  Given continue drop in hemoglobin, she was transferred to the ICU for another transfusion today in anticipatory management of transfusion reaction.  This has not yet been administered.    She underwent CT today to evaluate for source of bleed.  No luminal extravasation was noted, but she does have a hyperenhancing mass in the pancreatic head, almost certainly the etiology.  In addition to the bleeding, she complains of chronic subxyphoid and left subcostal pain.  Past Medical History:  Diagnosis Date   Actinic keratosis 01/17/2015   R forearm   Adjustment disorder with anxiety    Anemia    Anginal pain ([place])    Arthritis    "some in my hands" (11/11/2012)   Atrial fibrillation (HCC)    Atypical mole 03/25/2018   L forearm - severe   Automatic implantable cardioverter-defibrillator in situ    Avascular necrosis of hip ([place]) 05/03/2011   Blood in stool    Carotid artery stenosis 09/2007   a. 09/2007: 60-79% bilateral (stable); b. 10/2008: 40-59% R 60-79%    Chest pain, unspecified    Chronic airway obstruction, not elsewhere classified    Chronic bronchitis ([place])    "get  it some; not q year" (11/11/2012)   Chronic diastolic CHF (congestive heart failure) ([place]) 06/04/2013   Chronic kidney disease, unspecified    Coronary artery disease    non-obstructive by 2006 cath   Displaced fracture of left femoral neck ([place]) 10/09/2018   Frequent UTI    "get them a couple times/yr" (11/11/2012)   GI bleed 03/28/2020   Hemorrhage of rectum and anus    High cholesterol    History of blood transfusion 04/2011   "after hip OR" (11/11/2012)   Hx of renal cell cancer    Hypertension 05/20/2011   Hypertr obst cardiomyop    Hypotension, unspecified    cardiac cath 2006..nonobstructive CAD 30-40s lesions.Marland KitchenETT 1/09 nondiagnostic due to poor HR response..Right Renal Cancer 2003   Long term (current) use of anticoagulants    Malignant neoplasm of kidney, except pelvis    Osteoarthritis of right hip    Other and unspecified coagulation defects    PONV (postoperative nausea and vomiting)    Presence of permanent cardiac pacemaker    RECTAL BLEEDING 10/13/2009   Qualifier: Diagnosis of  By: Chester Holstein NP, Paula     Renal cancer [facility]) 06/2001   Right   Secondary cardiomyopathy, unspecified    Sinus bradycardia    Squamous cell carcinoma of skin 01/17/2015   R lat wrist   Squamous cell carcinoma of skin 01/29/2018   R post upper leg - superficially invasive   Squamous cell carcinoma of skin 03/25/2018   L lat foot  Squamous cell carcinoma of skin 11/09/2020   left lat foot - EDC 01/01/21, recurrent 01/31/21 - MOHs 02/22/21   Squamous cell carcinoma of skin 12/19/2021   Right Posterior Medial Thigh, EDC   Squamous cell carcinoma of skin 12/19/2021   SCC IS, L lat heel, EDC 02/04/2022   Squamous cell carcinoma of skin 01/21/2022   L forearm, EDC 02/04/2022   Squamous cell carcinoma of skin 01/21/2022   R lower leg below knee, EDC 02/04/2022   Squamous cell carcinoma of skin 01/21/2022   SCCIS, R post heel, EDC 02/04/2022   Urge incontinence     Past Surgical History:   Procedure Laterality Date   ABDOMINAL AORTOGRAM W/LOWER EXTREMITY N/A 02/12/2021   Procedure: ABDOMINAL AORTOGRAM W/LOWER EXTREMITY;  Surgeon: Waynetta Sandy, MD;  Location: [place] [facility];  Service: Cardiovascular;  Laterality: N/A;   ABDOMINAL HYSTERECTOMY  1975   for benign causes   APPENDECTOMY     BI-VENTRICULAR PACEMAKER UPGRADE  05/04/2010   BIOPSY  02/28/2019   Procedure: BIOPSY;  Surgeon: Milus Banister, MD;  Location: [facility] ENDOSCOPY;  Service: Endoscopy;;   CARDIAC CATHETERIZATION  2006   CARDIOVERSION N/A 02/20/2018   Procedure: CARDIOVERSION;  Surgeon: Minna Merritts, MD;  Location: [place] [facility];  Service: Cardiovascular;  Laterality: N/A;   CARDIOVERSION N/A 03/27/2018   Procedure: CARDIOVERSION;  Surgeon: Minna Merritts, MD;  Location: [facility] ORS;  Service: Cardiovascular;  Laterality: N/A;   CATARACT EXTRACTION W/ INTRAOCULAR LENS  IMPLANT, BILATERAL  01/2006-02-2006   CHOLECYSTECTOMY N/A 11/11/2012   Procedure: LAPAROSCOPIC CHOLECYSTECTOMY WITH INTRAOPERATIVE CHOLANGIOGRAM;  Surgeon: Imogene Burn. Georgette Dover, MD;  Location: [place];  Service: General;  Laterality: N/A;   COLONOSCOPY WITH PROPOFOL N/A 02/28/2019   Procedure: COLONOSCOPY WITH PROPOFOL;  Surgeon: Milus Banister, MD;  Location: [facility] ENDOSCOPY;  Service: Endoscopy;  Laterality: N/A;   ENTEROSCOPY N/A 03/30/2020   Procedure: ENTEROSCOPY;  Surgeon: Lavena Bullion, DO;  Location: [place];  Service: Gastroenterology;  Laterality: N/A;   ENTEROSCOPY N/A 02/10/2021   Procedure: ENTEROSCOPY;  Surgeon: Carol Ada, MD;  Location: [place];  Service: Endoscopy;  Laterality: N/A;   EP IMPLANTABLE DEVICE N/A 02/21/2016   Procedure: ICD Generator Changeout;  Surgeon: Deboraha Sprang, MD;  Location: [place] [facility];  Service: Cardiovascular;  Laterality: N/A;   ESOPHAGOGASTRODUODENOSCOPY (EGD) WITH PROPOFOL N/A 02/28/2019   Procedure: ESOPHAGOGASTRODUODENOSCOPY (EGD) WITH PROPOFOL;  Surgeon: Milus Banister, MD;  Location: [facility] ENDOSCOPY;  Service: Endoscopy;  Laterality: N/A;   GIVENS CAPSULE STUDY N/A 03/15/2019   Procedure: GIVENS CAPSULE STUDY;  Surgeon: Thornton Park, MD;  Location: [place];  Service: Gastroenterology;  Laterality: N/A;   HOT HEMOSTASIS N/A 03/30/2020   Procedure: HOT HEMOSTASIS (ARGON PLASMA COAGULATION/BICAP);  Surgeon: Lavena Bullion, DO;  Location: [facility] ENDOSCOPY;  Service: Gastroenterology;  Laterality: N/A;   HOT HEMOSTASIS N/A 02/10/2021   Procedure: HOT HEMOSTASIS (ARGON PLASMA COAGULATION/BICAP);  Surgeon: Carol Ada, MD;  Location: [place];  Service: Endoscopy;  Laterality: N/A;   INSERT / REPLACE / REMOVE PACEMAKER  05-01-11   02-28-05-/05-04-10-ICD-MEDTRONIC MAXIMAL DR   JOINT REPLACEMENT     LAPAROSCOPIC CHOLECYSTECTOMY  11/11/2012   LAPAROSCOPIC LYSIS OF ADHESIONS N/A 11/11/2012   Procedure: LAPAROSCOPIC LYSIS OF ADHESIONS;  Surgeon: Imogene Burn. Georgette Dover, MD;  Location: [place];  Service: General;  Laterality: N/A;   NEPHRECTOMY Right 06/2001    S/P RENAL CELL CANCER   PERIPHERAL VASCULAR INTERVENTION Bilateral 02/12/2021   Procedure: PERIPHERAL VASCULAR INTERVENTION;  Surgeon: Waynetta Sandy, MD;  Location: [place] [facility];  Service: Cardiovascular;  Laterality: Bilateral;  Iliac artery stents   PRESSURE SENSOR/CARDIOMEMS N/A 02/03/2019   Procedure: PRESSURE SENSOR/CARDIOMEMS;  Surgeon: Larey Dresser, MD;  Location: [place] [facility];  Service: Cardiovascular;  Laterality: N/A;   RIGHT HEART CATH N/A 11/09/2018   Procedure: RIGHT HEART CATH;  Surgeon: Larey Dresser, MD;  Location: [place] [facility];  Service: Cardiovascular;  Laterality: N/A;   RIGHT HEART CATH N/A 03/08/2019   Procedure: RIGHT HEART CATH;  Surgeon: Larey Dresser, MD;  Location: [place] [facility];  Service: Cardiovascular;  Laterality: N/A;   SUBMUCOSAL TATTOO INJECTION  02/28/2019   Procedure: SUBMUCOSAL TATTOO INJECTION;  Surgeon: Milus Banister, MD;  Location:  [facility] ENDOSCOPY;  Service: Endoscopy;;   TOTAL HIP ARTHROPLASTY Right 05/03/2011   Procedure: TOTAL HIP ARTHROPLASTY ANTERIOR APPROACH;  Surgeon: Mcarthur Rossetti;  Location: [facility];  Service: Orthopedics;  Laterality: Right;  Removal of Cannulated Screws Right Hip, Right Direct Anterior Hip Replacement   TOTAL HIP ARTHROPLASTY Left 10/09/2018   Procedure: TOTAL HIP ARTHROPLASTY ANTERIOR APPROACH;  Surgeon: Rod Can, MD;  Location: [place];  Service: Orthopedics;  Laterality: Left;    Allergies: Dilaudid [hydromorphone], Fentanyl, Codeine, Morphine and related, Hydrocodone, Oxycodone, Tramadol, Whole blood, and Sulfonamide derivatives  Medications: Prior to Admission medications   Medication Sig Start Date End Date Taking? Authorizing Provider  acetaminophen (TYLENOL) 500 MG tablet Take 500-1,000 mg by mouth every 8 (eight) hours as needed for mild pain or headache.   Yes [provider]  albuterol (VENTOLIN HFA) 108 (90 Base) MCG/ACT inhaler Inhale 2 puffs into the lungs every 6 (six) hours as needed for wheezing or shortness of breath. 02/08/22  Yes Waunita Schooner, MD  Cyanocobalamin (B-12 PO) Take 1 tablet by mouth daily.    Yes [provider]  ELIQUIS 2.5 MG TABS tablet TAKE 1 TABLET BY MOUTH TWICE A DAY Patient taking differently: Take 2.5 mg by mouth 2 (two) times daily. 06/11/21  Yes Larey Dresser, MD  fluticasone [facility]) 50 MCG/ACT nasal spray Place 2 sprays into both nostrils daily as needed for allergies or rhinitis. 03/08/21  Yes Waunita Schooner, MD  metolazone (ZAROXOLYN) 2.5 MG tablet Take 1 tablet (2.5 mg total) by mouth as directed. Take every other Thursday with your morning dose of Torsemide Patient taking differently: Take 2.5 mg by mouth once a week. Take Thursday with your morning dose of Torsemide 08/08/21  Yes [place], [place], FNP  potassium chloride SA (KLOR-CON M) 20 MEQ tablet TAKE 2 TABLETS (40 MEQ) BY MOUTH 2 TIMESDAILY. TAKE 1 EXTRA TABLET ON  THE DAYS YOU TAKE METOLAZONE. Patient taking differently: Take 40-60 mEq by mouth 2 (two) times daily. Take 1 extra tablet (20 mEq) on the days you take metolazone. 10/04/21  Yes Larey Dresser, MD  vitamin C (ASCORBIC ACID) 250 MG tablet Take 500 mg by mouth daily.   Yes [provider]  VITAMIN D PO Take 4,000 Units by mouth daily.   Yes [provider]  clobetasol cream (TEMOVATE) 0.05 % Apply to affected area left foot twice a day until improved. Avoid face, groin, axilla. Patient not taking: Reported on 02/13/2022 12/19/21   Brendolyn Patty, MD  octreotide (SANDOSTATIN LAR) 10 MG injection Inject 10 mg into the muscle every 28 (twenty-eight) days. Patient not taking: Reported on 02/13/2022 12/26/21   Cirigliano, Vito V, DO  torsemide (DEMADEX) 100 MG  tablet TAKE 1 TABLET BY MOUTH EVERY MORNING (EVENING DOSE 80 MG) Patient taking differently: Take 80-100 mg by mouth See admin instructions. Take '100mg'$  by mouth every morning and 80 every evening. 01/03/22   Larey Dresser, MD     Family History  Problem Relation Age of Onset   Heart failure Mother    Early death Father        car accident   Breast cancer Maternal Aunt 82   Breast cancer Cousin    Breast cancer Other    Colon cancer Neg Hx    Esophageal cancer Neg Hx    Liver disease Neg Hx    Pancreatic cancer Neg Hx    Rectal cancer Neg Hx     Social History   Socioeconomic History   Marital status: Widowed    Spouse name: Not on file   Number of children: 2   Years of education: high school   Highest education level: Not on file  Occupational History   Occupation: Retired    Fish farm manager: RETIRED  Tobacco Use   Smoking status: Former    Packs/day: 0.50    Years: 40.00    Total pack years: 20.00    Types: Cigarettes    Quit date: 05/06/2001    Years since quitting: 20.7   Smokeless tobacco: Never  Vaping Use   Vaping Use: Never used  Substance and Sexual Activity   Alcohol use: No    Alcohol/week: 0.0  standard drinks of alcohol   Drug use: No   Sexual activity: Not Currently  Other Topics Concern   Not on file  Social History Narrative   Would desire CPR   07/11/20   From: the area   Living: with great-grandson - [name] (2003)   Work: retired - clerical      Family: 2 children - [name] and [place] - 2 grand chlidren - 5 great grand children      Enjoys: spending time with friends - watch move, eat out, spend time      Exercise: walking around the house   Diet: not good, limits red meat, limits salt, low appetite      Safety   Seat belts: Yes    Guns: Yes  and secure   Safe in relationships: Yes    Social Determinants of Health   Financial Resource Strain: Low Risk  (09/24/2021)   Overall Financial Resource Strain (CARDIA)    Difficulty of Paying Living Expenses: Not hard at all  Food Insecurity: No Food Insecurity (02/14/2022)   Hunger Vital Sign    Worried About Running Out of Food in the Last Year: Never true    [place] in the Last Year: Never true  Transportation Needs: No Transportation Needs (02/14/2022)   PRAPARE - Hydrologist (Medical): No    Lack of Transportation (Non-Medical): No  Physical Activity: Inactive (09/24/2021)   Exercise Vital Sign    Days of Exercise per Week: 0 days    Minutes of Exercise per Session: 0 min  Stress: No Stress Concern Present (09/24/2021)   [place]    Feeling of Stress : Not at all  Social Connections: Not on file    Review of Systems: A 12 point ROS discussed and pertinent positives are indicated in the HPI above.  All other systems are negative.  Vital Signs: BP (!) 136/53 (BP Location: Left Arm)   Pulse  60   Temp 98 F (36.7 C) (Oral)   Resp (!) 23   Ht '5\' 6"'$  (1.676 m)   Wt 64.4 kg   SpO2 99%   BMI 22.92 kg/m   Advance Care Plan: The advanced care plan/surrogate decision maker was discussed at the time of visit and  documented in the medical record.    Physical Exam Constitutional:      General: She is not in acute distress. HENT:     Head: Normocephalic.     Mouth/Throat:     Mouth: Mucous membranes are moist.     Comments: MP2 Cardiovascular:     Rate and Rhythm: Normal rate and regular rhythm.     Pulses: Normal pulses.  Pulmonary:     Breath sounds: Normal breath sounds.  Abdominal:     General: There is no distension.  Musculoskeletal:        General: No swelling.  Skin:    General: Skin is warm and dry.     Coloration: Skin is not jaundiced.  Neurological:     Mental Status: She is alert and oriented to person, place, and time.     Imaging: CTA AP 02/16/22     Labs:  CBC: Recent Labs    02/15/22 0130 02/15/22 0728 02/15/22 1848 02/16/22 0454 02/16/22 1213  WBC 6.8 6.2 5.5 6.9  --   HGB 8.3* 8.5* 7.6* 7.1* 7.0*  HCT 24.6* 25.3* 24.0* 21.6* 22.2*  PLT 123* 133* 161 164  --     COAGS: Recent Labs    02/13/22 1625  INR 1.2    BMP: Recent Labs    02/13/22 1625 02/14/22 1241 02/15/22 0130 02/16/22 0454  NA 136 138 137 135  K 3.4* 3.4* 3.8 3.7  CL 100 105 103 104  CO2 '27 23 26 25  '$ GLUCOSE 102* 89 153* 144*  BUN 68* 49* 43* 46*  CALCIUM 8.5* 7.9* 8.0* 8.0*  CREATININE 1.32* 1.42* 1.40* 1.33*  GFRNONAA 40* 36* 37* 39*    LIVER FUNCTION TESTS: Recent Labs    02/13/22 1625 02/14/22 1241 02/15/22 0130 02/16/22 0454  BILITOT 0.6 1.8* 1.1 0.6  AST 21 25 33 28  ALT '14 12 14 16  '$ ALKPHOS 36* 35* 42 32*  PROT 5.5* 5.1* 5.1* 4.9*  ALBUMIN 3.0* 2.6* 2.6* 2.5*    TUMOR MARKERS: No results for input(s): "AFPTM", "CEA", "CA199", "CHROMGRNA" in the last 8760 hours.  Assessment and Plan: 88 year old female with history of worsening upper gastrointestinal hemorrhage with newly diagnosed pancreatic head mass on CT today.  While there is no active extravasation visualized from this mass into the duodenum, it is almost certainly the etiology of her  hemorrhage as no other evidence significant sources of GI bleed are noted on the CT or on endoscopy yesterday.    These findings were discussed with the patient and her daughter, [name].  We discussed options of watchful waiting with continued transfusions and mesenteric angiogram with possible embolization of the mass or any associated vascular anomalies that could be contributing to hemorrhage.    She is amenable to proceed with angiogram.    Plan to hold further transfusion until after angiogram given transfusion reaction history.    Risksand benefits of mesenteric angiogram with possible embolization were discussed with the patient including, but not limited to bleeding, infection, vascular injury or contrast induced renal failure.  All of the patient's questions were answered, patient is agreeable to proceed.  Consent signed and in  chart.    Electronically Signed: Suzette Battiest, MD 02/16/2022, 4:28 PM   I spent a total of 60 Miinutes  in face to face in clinical consultation, greater than 50% of which was counseling/coordinating care for upper gastrointestinal hemorrhage.

## 2022-02-16 NOTE — Progress Notes (Addendum)
NAME:  [name], MRN:  [id number], DOB:  Feb 18, 1934, LOS: 3 ADMISSION DATE:  02/13/2022, CONSULTATION DATE:  02/14/2022 REFERRING MD:  Dr. Cruzita Lederer, [facility], CHIEF COMPLAINT:  [place]   History of Present Illness:  88 yo female brought to ER due to low Hb and 3 days of melena.  She has hx of AVMs.  She is on eliquis for a fib.  She was diagnosed with COVID 6 days prior to admission.  GI consulted.  On 10/12 she developed rigors with hypoxia and concern for transfusion reaction.  Transfusion stopped and given solumedrol.  Developed hypotension and fever.  Antibiotics started.  She complained of chest pain and troponin elevated.  PCCM initially consulted 10/12 for melena, at which time she did not require ICU transfer PCCM reconsulted 10/14 for melena with need for blood transfusion. In light of hx transfusion reactions, pt to be transferred to ICU   Pertinent  Medical History  A fib, GI bleed with AVMs, HTN, Hypothyroidism, Anxiety, OA, CKD, CAD, HLD, Renal cell cancer s/p Rt nephrectomy, Pulmonary hypertension, Aortic stenosis, COPD, non ischemic CM, s/p ICD, Celiac and SMA stenoses  Significant Hospital Events: Including procedures, antibiotic start and stop dates in addition to other pertinent events   10/11 Admit, GI consulted.  10/12 possible transfusion reaction 10/13 gastritis, 2 nonbleeding AVMs s/p cautery on enteroscopy  10/14 hgb cont to drop, down to 7.1   Interim History / Subjective:   Feeling worse today Hgb down to 7.1    Objective   Blood pressure (!) 132/51, pulse 61, temperature 97.8 F (36.6 C), temperature source Oral, resp. rate 20, height '5\' 6"'$  (1.676 m), weight 64.4 kg, SpO2 96 %.    FiO2 (%):  [3 %] 3 %   Intake/Output Summary (Last 24 hours) at 02/16/2022 1231 Last data filed at 02/15/2022 1529 Gross per 24 hour  Intake 2435.49 ml  Output --  Net 2435.49 ml   Filed Weights   02/13/22 1507  Weight: 64.4 kg    Examination:  General - ill  appearing elderly F  HEENT- NCAT pink mm  Cardiac - s1s2 cap refill < 3sec  Chest - CTAb even unlabored on RA  Abdomen -soft ndnt. Dark stools  Extremities - no acute joint deformity no cyanosis or clubbing  Skin - c/d/w  Neuro - AAOx4    Resolved Hospital Problem list     Assessment & Plan:    ABLA due to GIB Gastritis  Non-bleeding duodenal AVM x2, s/p cauterization  -GI following -CTA a/p  -transfer to ICU for transfusion   Transfusion reaction  - 2 yrs ago and on 10/12 -will transfer to ICU for closer monitoring while receives PRBC 10/14  -will premedicate & time transfusion for after CTA a/p  NSTEMI Hx CAD Chronic diastolic HF NICM s/p AICD Permanent aFib on Eliquis  Pulmonary venous hypertension AS  HLD -hold eliquis  -zetia, crestor   Recent COVID infection with possible pneumonia. -received antivirals and steroids starting 10/5 prior to admission -received 5 doses of zosyn starting 10/12 P -iso until 10/15 - started on zosyn  - f/u CXR  CKD 3b Hx renal cell carcinoma s/p R nephrectomy  - follow renal fxn, UOP     Best Practice (right click and "Reselect all SmartList Selections" daily)   Diet/type: NPO DVT prophylaxis: SCD GI prophylaxis: PPI Lines: N/A Foley:  N/A Code Status:  full code Last date of multidisciplinary goals of care discussion '[x]'$   Labs  CBC: Recent Labs  Lab 02/12/22 1655 02/13/22 1625 02/13/22 2106 02/14/22 2032 02/15/22 0130 02/15/22 0728 02/15/22 1848 02/16/22 0454  WBC 2.6* 3.0*   < > 9.9 6.8 6.2 5.5 6.9  NEUTROABS 1,862 2.3  --   --   --   --   --   --   HGB 8.9* 7.8*   < > 7.9* 8.3* 8.5* 7.6* 7.1*  HCT 26.7* 23.8*   < > 23.7* 24.6* 25.3* 24.0* 21.6*  MCV 84.0 88.1   < > 87.8 87.2 87.5 89.6 87.1  PLT 144 142*   < > 131* 123* 133* 161 164   < > = values in this interval not displayed.    Basic Metabolic Panel: Recent Labs  Lab 02/12/22 1655 02/13/22 1625 02/14/22 0245 02/14/22 1241 02/15/22 0130  02/16/22 0454  NA 140 136  --  138 137 135  K 4.2 3.4*  --  3.4* 3.8 3.7  CL 98 100  --  105 103 104  CO2 23 27  --  '23 26 25  '$ GLUCOSE 97 102*  --  89 153* 144*  BUN 85* 68*  --  49* 43* 46*  CREATININE 1.34* 1.32*  --  1.42* 1.40* 1.33*  CALCIUM 9.2 8.5*  --  7.9* 8.0* 8.0*  MG  --   --  2.1  --  1.9 2.0  PHOS  --   --  2.5  --   --   --    GFR: Estimated Creatinine Clearance: 29 mL/min (A) (by C-G formula based on SCr of 1.33 mg/dL (H)). Recent Labs  Lab 02/13/22 1505 02/13/22 1625 02/15/22 0130 02/15/22 0728 02/15/22 1848 02/16/22 0454  WBC  --    < > 6.8 6.2 5.5 6.9  LATICACIDVEN 1.2  --   --   --   --   --    < > = values in this interval not displayed.    Liver Function Tests: Recent Labs  Lab 02/12/22 1655 02/13/22 1625 02/14/22 1241 02/15/22 0130 02/16/22 0454  AST '22 21 25 '$ 33 28  ALT '11 14 12 14 16  '$ ALKPHOS  --  36* 35* 42 32*  BILITOT 0.4 0.6 1.8* 1.1 0.6  PROT 6.3 5.5* 5.1* 5.1* 4.9*  ALBUMIN  --  3.0* 2.6* 2.6* 2.5*   No results for input(s): "LIPASE", "AMYLASE" in the last 168 hours. No results for input(s): "AMMONIA" in the last 168 hours.  ABG    Component Value Date/Time   PHART 7.469 (H) 11/06/2018 0355   PCO2ART 46.5 11/06/2018 0355   PO2ART 68.3 (L) 11/06/2018 0355   HCO3 31.6 (H) 03/08/2019 0933   TCO2 33 (H) 03/08/2019 0933   O2SAT 59.0 03/08/2019 0933     Coagulation Profile: Recent Labs  Lab 02/13/22 1625  INR 1.2    Cardiac Enzymes: No results for input(s): "CKTOTAL", "CKMB", "CKMBINDEX", "TROPONINI" in the last 168 hours.  HbA1C: Hgb A1c MFr Bld  Date/Time Value Ref Range Status  02/09/2021 02:07 AM 4.6 (L) 4.8 - 5.6 % Final    Comment:    (NOTE) Pre diabetes:          5.7%-6.4%  Diabetes:              >6.4%  Glycemic control for   <7.0% adults with diabetes   08/17/2014 05:00 AM 6.1 (H) 4.8 - 5.6 % Final    Comment:    (NOTE)         Pre-diabetes: 5.7 -  6.4         Diabetes: >6.4         Glycemic control  for adults with diabetes: <7.0     CBG: No results for input(s): "GLUCAP" in the last 168 hours.  CCT n/a   Eliseo Gum MSN, AGACNP-BC [place] for pager  02/16/2022, 12:31 PM

## 2022-02-17 DIAGNOSIS — D62 Acute posthemorrhagic anemia: Secondary | ICD-10-CM

## 2022-02-17 DIAGNOSIS — K8689 Other specified diseases of pancreas: Secondary | ICD-10-CM | POA: Diagnosis not present

## 2022-02-17 DIAGNOSIS — K921 Melena: Secondary | ICD-10-CM | POA: Diagnosis not present

## 2022-02-17 DIAGNOSIS — C259 Malignant neoplasm of pancreas, unspecified: Secondary | ICD-10-CM

## 2022-02-17 LAB — CBC
HCT: 20.8 % — ABNORMAL LOW (ref 36.0–46.0)
HCT: 21.5 % — ABNORMAL LOW (ref 36.0–46.0)
Hemoglobin: 6.8 g/dL — CL (ref 12.0–15.0)
Hemoglobin: 6.7 g/dL — CL (ref 12.0–15.0)
MCH: 28.8 pg (ref 26.0–34.0)
MCH: 28.4 pg (ref 26.0–34.0)
MCHC: 32.7 g/dL (ref 30.0–36.0)
MCHC: 31.2 g/dL (ref 30.0–36.0)
MCV: 88.1 fL (ref 80.0–100.0)
MCV: 91.1 fL (ref 80.0–100.0)
Platelets: 195 10*3/uL (ref 150–400)
Platelets: 156 10*3/uL (ref 150–400)
RBC: 2.36 MIL/uL — ABNORMAL LOW (ref 3.87–5.11)
RBC: 2.36 MIL/uL — ABNORMAL LOW (ref 3.87–5.11)
RDW: 17.1 % — ABNORMAL HIGH (ref 11.5–15.5)
RDW: 17.1 % — ABNORMAL HIGH (ref 11.5–15.5)
WBC: 8.8 10*3/uL (ref 4.0–10.5)
WBC: 7.9 10*3/uL (ref 4.0–10.5)
nRBC: 2.3 % — ABNORMAL HIGH (ref 0.0–0.2)
nRBC: 2.9 % — ABNORMAL HIGH (ref 0.0–0.2)

## 2022-02-17 LAB — BASIC METABOLIC PANEL
Anion gap: 7 (ref 5–15)
BUN: 38 mg/dL — ABNORMAL HIGH (ref 8–23)
CO2: 24 mmol/L (ref 22–32)
Calcium: 8.3 mg/dL — ABNORMAL LOW (ref 8.9–10.3)
Chloride: 106 mmol/L (ref 98–111)
Creatinine, Ser: 1.4 mg/dL — ABNORMAL HIGH (ref 0.44–1.00)
GFR, Estimated: 37 mL/min — ABNORMAL LOW (ref >60)
Glucose, Bld: 109 mg/dL — ABNORMAL HIGH (ref 70–99)
Potassium: 3.5 mmol/L (ref 3.5–5.1)
Sodium: 137 mmol/L (ref 135–145)

## 2022-02-17 LAB — MAGNESIUM: Magnesium: 2.2 mg/dL (ref 1.7–2.4)

## 2022-02-17 MED ORDER — METHYLPREDNISOLONE SODIUM SUCC 40 MG IJ SOLR: 40.0000 mg | Freq: Once | INTRAMUSCULAR | Status: DC

## 2022-02-17 MED ORDER — LORAZEPAM 2 MG/ML IJ SOLN
0.5000 mg | Freq: Once | INTRAMUSCULAR | Status: AC
  Administered 2022-02-17: 0.5 mg via INTRAVENOUS
  Filled 2022-02-17: qty 1

## 2022-02-17 MED ORDER — SODIUM CHLORIDE 0.9 % IV SOLN
6.2500 mg | Freq: Once | INTRAVENOUS | Status: AC
  Administered 2022-02-17: 6.25 mg via INTRAVENOUS
  Filled 2022-02-17: qty 0.25

## 2022-02-17 MED ORDER — DIPHENHYDRAMINE HCL 50 MG/ML IJ SOLN
12.5000 mg | Freq: Once | INTRAMUSCULAR | Status: AC
  Administered 2022-02-17: 12.5 mg via INTRAVENOUS
  Filled 2022-02-17: qty 1

## 2022-02-17 MED ORDER — METOCLOPRAMIDE HCL 5 MG/ML IJ SOLN
5.0000 mg | Freq: Three times a day (TID) | INTRAMUSCULAR | Status: DC
  Administered 2022-02-17 – 2022-02-18 (×2): 5 mg via INTRAVENOUS
  Filled 2022-02-17 (×3): qty 2

## 2022-02-17 MED ORDER — METHYLPREDNISOLONE SODIUM SUCC 40 MG IJ SOLR
INTRAMUSCULAR | Status: AC
  Filled 2022-02-17: qty 1

## 2022-02-17 MED ORDER — POTASSIUM CHLORIDE CRYS ER 20 MEQ PO TBCR
40.0000 meq | EXTENDED_RELEASE_TABLET | Freq: Once | ORAL | Status: AC
  Administered 2022-02-17: 40 meq via ORAL
  Filled 2022-02-17: qty 2

## 2022-02-17 NOTE — Progress Notes (Addendum)
Referring Physician(s):Danis, Mallie Mussel, MD  Supervising Physician: Ruthann Cancer  Patient Status:  [name] - In-pt  Chief Complaint: Follow up coil embolization of GDA and pancreaticoduodenal arcade 02/16/22 in IR  Subjective:  Patient sleeping, arouses easily - states she feels ok. Right groin puncture site is sore. No hematochezia/melena that she is aware of so far this morning.  Allergies: Dilaudid [hydromorphone], Fentanyl, Codeine, Morphine and related, Hydrocodone, Oxycodone, Tramadol, Whole blood, and Sulfonamide derivatives  Medications: Prior to Admission medications   Medication Sig Start Date End Date Taking? Authorizing Provider  acetaminophen (TYLENOL) 500 MG tablet Take 500-1,000 mg by mouth every 8 (eight) hours as needed for mild pain or headache.   Yes [provider]  albuterol (VENTOLIN HFA) 108 (90 Base) MCG/ACT inhaler Inhale 2 puffs into the lungs every 6 (six) hours as needed for wheezing or shortness of breath. 02/08/22  Yes Waunita Schooner, MD  Cyanocobalamin (B-12 PO) Take 1 tablet by mouth daily.    Yes [provider]  ELIQUIS 2.5 MG TABS tablet TAKE 1 TABLET BY MOUTH TWICE A DAY Patient taking differently: Take 2.5 mg by mouth 2 (two) times daily. 06/11/21  Yes Larey Dresser, MD  fluticasone [name]) 50 MCG/ACT nasal spray Place 2 sprays into both nostrils daily as needed for allergies or rhinitis. 03/08/21  Yes Waunita Schooner, MD  metolazone (ZAROXOLYN) 2.5 MG tablet Take 1 tablet (2.5 mg total) by mouth as directed. Take every other Thursday with your morning dose of Torsemide Patient taking differently: Take 2.5 mg by mouth once a week. Take Thursday with your morning dose of Torsemide 08/08/21  Yes [place], [place], FNP  potassium chloride SA (KLOR-CON M) 20 MEQ tablet TAKE 2 TABLETS (40 MEQ) BY MOUTH 2 TIMESDAILY. TAKE 1 EXTRA TABLET ON THE DAYS YOU TAKE METOLAZONE. Patient taking differently: Take 40-60 mEq by mouth 2 (two) times daily. Take  1 extra tablet (20 mEq) on the days you take metolazone. 10/04/21  Yes Larey Dresser, MD  vitamin C (ASCORBIC ACID) 250 MG tablet Take 500 mg by mouth daily.   Yes [provider]  VITAMIN D PO Take 4,000 Units by mouth daily.   Yes [provider]  clobetasol cream (TEMOVATE) 0.05 % Apply to affected area left foot twice a day until improved. Avoid face, groin, axilla. Patient not taking: Reported on 02/13/2022 12/19/21   Brendolyn Patty, MD  octreotide (SANDOSTATIN LAR) 10 MG injection Inject 10 mg into the muscle every 28 (twenty-eight) days. Patient not taking: Reported on 02/13/2022 12/26/21   Cirigliano, Vito V, DO  torsemide (DEMADEX) 100 MG tablet TAKE 1 TABLET BY MOUTH EVERY MORNING (EVENING DOSE 80 MG) Patient taking differently: Take 80-100 mg by mouth See admin instructions. Take '100mg'$  by mouth every morning and 80 every evening. 01/03/22   Larey Dresser, MD     Vital Signs: BP (!) 111/58 (BP Location: Right Arm)   Pulse 75   Temp 98.6 F (37 C) (Oral)   Resp (!) 22   Ht '5\' 6"'$  (1.676 m)   Wt 142 lb (64.4 kg)   SpO2 96%   BMI 22.92 kg/m   Physical Exam Vitals and nursing note reviewed.  Constitutional:      General: She is not in acute distress. HENT:     Head: Normocephalic.  Cardiovascular:     Rate and Rhythm: Normal rate.     Comments: (+) right CFA puncture site clean, dry, dressed appropriately. Mildly tender to  palpation, soft, no significant bruising, non pulsatile.  Pulmonary:     Effort: Pulmonary effort is normal.  Abdominal:     General: There is no distension.     Palpations: Abdomen is soft.     Tenderness: There is no abdominal tenderness.  Skin:    General: Skin is warm and dry.  Neurological:     Mental Status: She is alert.     Imaging: IR Angiogram Visceral Selective  Result Date: 02/17/2022 INDICATION: 88 year old female with chronic, worsening upper gastrointestinal hemorrhage with hypervascular pancreatic head mass  visualized on CT earlier today, concerning for neuroendocrine tumor. EXAM: 1. Ultrasound-guided vascular access of the right common femoral artery. 2. Catheterization and angiography of the superior mesenteric and celiac arteries 3. Sub selective catheterization angiography of the inferior pancreaticoduodenal arcade and gastroduodenal arteries 4. Coil embolization of the inferior pancreaticoduodenal arcade and gastroduodenal artery. MEDICATIONS: None. ANESTHESIA/SEDATION: Moderate (conscious) sedation was employed during this procedure. A total of Versed 0 mg and Fentanyl 12.5 mcg was administered intravenously. Moderate Sedation Time: 196 minutes. The patient's level of consciousness and vital signs were monitored continuously by radiology nursing throughout the procedure under my direct supervision. CONTRAST:  57m OMNIPAQUE IOHEXOL 300 MG/ML SOLN, 1046mOMNIPAQUE IOHEXOL 300 MG/ML SOLN, 4066mMNIPAQUE IOHEXOL 300 MG/ML SOLN, 19m19mNIPAQUE IOHEXOL 300 MG/ML SOLN FLUOROSCOPY: Radiation Exposure Index (as provided by the fluoroscopic device): 2,847,341 Kerma COMPLICATIONS: None immediate. PROCEDURE: Informed consent was obtained from the patient following explanation of the procedure, risks, benefits and alternatives. The patient understands, agrees and consents for the procedure. All questions were addressed. A time out was performed prior to the initiation of the procedure. Maximal barrier sterile technique utilized including caps, mask, sterile gowns, sterile gloves, large sterile drape, hand hygiene, and Betadine prep. Preprocedure ultrasound evaluation of the right common femoral artery demonstrated patency. The procedure was planned. Subdermal Local anesthesia was provided at the planned needle entry site. A small skin nick was made with an 11 scalpel. Under direct ultrasound visualization, the right common femoral artery was punctured with a 21 gauge micropuncture needle. A permanent ultrasound image was  captured and stored in the record. A microwire was introduced without difficulty. The needle was exchanged for a micropuncture sheath. Limited right lower extremity angiogram demonstrated adequate puncture site for closure device use. A J wire was directed to the level of the abdominal aorta. The micropuncture sheath was exchanged for a 5 [place] cm vascular sheath. A 5 French, C2 catheter was directed to the level of the superior mesenteric artery, however was unable to engage the ostium. Therefore, the catheter was exchanged over the wire for a Mickelson catheter which was used to select the superior mesenteric artery ostium. Superior mesenteric angiogram was performed which demonstrated patency of the superior mesenteric artery with hypervascular mass in the region of the pancreatic head which measured approximately 4.2 x 4.1 cm in frontal projection diameters. The mass vascular supply appeared to arise from a proximal branch of the superior mesenteric artery compatible with the inferior pancreaticoduodenal arcade. There is no definite evidence of active extravasation into the gastrointestinal lumen. A Progreat Omega microcatheter and fathom 16 microwire were used to attempt to select the inferior pancreaticoduodenal arcade, however this was very challenging due to angulation and position of the base catheter in the SMA. A combination of catheters and wires were attempted which was eventually successful with a 5 [place] catheter in the SMA, a 2.5 Fren[place]istantata microcatheter, and 0.018 inch double  angle glide GT wire. Angiogram of the inferior pancreaticoduodenal arcade was performed which demonstrated primary supply to the inferior aspect of the spherical pancreatic head mass. Again, no evidence of definite gastric extravasation into the duodenal lumen was observed. There was early venous outflow via peripheral portal vein branches concerning for possible arteriovenous shunting. An assortment of Penumbra  Ruby microcoils were then used to embolize the distal branches of the inferior pancreticoduodenal arcade, preserving flow to a proximal duodenal branch arising from the parent vessel. Repeat angiogram from proximal in the pancreaticoduodenal parent branch demonstrated complete embolization of the inferior aspect of the mass. The microcatheter was then removed and selection of the celiac trunk was pursued. A 5 [place] Mickelson catheter was engaged in the celiac trunk and celiac angiogram was performed. Angiogram was significant for patent splenic and common hepatic arteries with supply arising from the gastroduodenal artery to the more superior aspect of the spherical mass. Again, no evidence of active gastrointestinal hemorrhage was observed. The gastroduodenal artery was then selected the Progreat microcatheter and fathom wire. Angiogram from the proximal duodenal artery demonstrated tumor blush arising from the superior pancreaticoduodenal branches. Therefore, coil embolization was performed of the distal gastroduodenal artery at the pancreaticoduodenal branch point with an assortment of Penumbra Ruby microcoils. Upon deployment of the final coil, a previously deployed coil became dislodged and traveled into the proximal proper hepatic artery. A micro snare was then used to attempt to capture the coil which was unsuccessful due to the snare being too small. Therefore, a Wholey wire was directed into the distal proper [place] artery. The base catheter and 5 French femoral sheath were removed. A 7 French, 55 cm tori guide sheath was then inserted and directed into the celiac ostium. A 6 [place] Ensnare was then used to capture and removed the displaced coil. Completion celiac angiogram demonstrated adequate embolization of the distal gastroduodenal artery without significant evidence of persistent enhancement about the superior aspect of the pancreatic head mass. Vasospasm was visualized in the right hepatic artery.  The 7 French sheath was removed and exchanged for an 8 French Angio-Seal device which was deployed successfully to close the right common femoral artery. Distal pulses were unchanged. A sterile bandage was applied. The patient tolerated the procedure well was transferred back to the ICU in stable condition. IMPRESSION: 1. Hyperenhancing spherical mass about the pancreatic head with vascular supply from the superior and inferior pancreaticoduodenal arcades. No evidence of active gastrointestinal hemorrhage. 2. Given that this mass is the presumed etiology of gastrointestinal bleeding, coil embolization of the inferior and superior pancreatico duodenal arteries was performed successfully. Ruthann Cancer, MD [name] [place] [facility] Electronically Signed   By: Ruthann Cancer M.D.   On: 02/17/2022 09:52   IR US Guide Vasc Access Right  Result Date: 02/17/2022 INDICATION: 88 year old female with chronic, worsening upper gastrointestinal hemorrhage with hypervascular pancreatic head mass visualized on CT earlier today, concerning for neuroendocrine tumor. EXAM: 1. Ultrasound-guided vascular access of the right common femoral artery. 2. Catheterization and angiography of the superior mesenteric and celiac arteries 3. Sub selective catheterization angiography of the inferior pancreaticoduodenal arcade and gastroduodenal arteries 4. Coil embolization of the inferior pancreaticoduodenal arcade and gastroduodenal artery. MEDICATIONS: None. ANESTHESIA/SEDATION: Moderate (conscious) sedation was employed during this procedure. A total of Versed 0 mg and Fentanyl 12.5 mcg was administered intravenously. Moderate Sedation Time: 196 minutes. The patient's level of consciousness and vital signs were monitored continuously by radiology nursing throughout the procedure under my  direct supervision. CONTRAST:  34m OMNIPAQUE IOHEXOL 300 MG/ML SOLN, 1077mOMNIPAQUE IOHEXOL 300 MG/ML SOLN,  4068mMNIPAQUE IOHEXOL 300 MG/ML SOLN, 58m54mNIPAQUE IOHEXOL 300 MG/ML SOLN FLUOROSCOPY: Radiation Exposure Index (as provided by the fluoroscopic device): [phone] Kerma COMPLICATIONS: None immediate. PROCEDURE: Informed consent was obtained from the patient following explanation of the procedure, risks, benefits and alternatives. The patient understands, agrees and consents for the procedure. All questions were addressed. A time out was performed prior to the initiation of the procedure. Maximal barrier sterile technique utilized including caps, mask, sterile gowns, sterile gloves, large sterile drape, hand hygiene, and Betadine prep. Preprocedure ultrasound evaluation of the right common femoral artery demonstrated patency. The procedure was planned. Subdermal Local anesthesia was provided at the planned needle entry site. A small skin nick was made with an 11 scalpel. Under direct ultrasound visualization, the right common femoral artery was punctured with a 21 gauge micropuncture needle. A permanent ultrasound image was captured and stored in the record. A microwire was introduced without difficulty. The needle was exchanged for a micropuncture sheath. Limited right lower extremity angiogram demonstrated adequate puncture site for closure device use. A J wire was directed to the level of the abdominal aorta. The micropuncture sheath was exchanged for a 5 [place] cm vascular sheath. A 5 French, C2 catheter was directed to the level of the superior mesenteric artery, however was unable to engage the ostium. Therefore, the catheter was exchanged over the wire for a Mickelson catheter which was used to select the superior mesenteric artery ostium. Superior mesenteric angiogram was performed which demonstrated patency of the superior mesenteric artery with hypervascular mass in the region of the pancreatic head which measured approximately 4.2 x 4.1 cm in frontal projection diameters. The mass vascular supply  appeared to arise from a proximal branch of the superior mesenteric artery compatible with the inferior pancreaticoduodenal arcade. There is no definite evidence of active extravasation into the gastrointestinal lumen. A Progreat Omega microcatheter and fathom 16 microwire were used to attempt to select the inferior pancreaticoduodenal arcade, however this was very challenging due to angulation and position of the base catheter in the SMA. A combination of catheters and wires were attempted which was eventually successful with a 5 [place]ren[place] catheter in the SMA, a 2.5 Fren[place] microcatheter, and 0.018 inch double angle glide GT wire. Angiogram of the inferior pancreaticoduodenal arcade was performed which demonstrated primary supply to the inferior aspect of the spherical pancreatic head mass. Again, no evidence of definite gastric extravasation into the duodenal lumen was observed. There was early venous outflow via peripheral portal vein branches concerning for possible arteriovenous shunting. An assortment of Penumbra Ruby microcoils were then used to embolize the distal branches of the inferior pancreticoduodenal arcade, preserving flow to a proximal duodenal branch arising from the parent vessel. Repeat angiogram from proximal in the pancreaticoduodenal parent branch demonstrated complete embolization of the inferior aspect of the mass. The microcatheter was then removed and selection of the celiac trunk was pursued. A 5 Fren[place]elson catheter was engaged in the celiac trunk and celiac angiogram was performed. Angiogram was significant for patent splenic and common hepatic arteries with supply arising from the gastroduodenal artery to the more superior aspect of the spherical mass. Again, no evidence of active gastrointestinal hemorrhage was observed. The gastroduodenal artery was then selected the Progreat microcatheter and fathom wire. Angiogram from the proximal duodenal artery demonstrated tumor  blush arising from the superior pancreaticoduodenal branches. Therefore, coil embolization  was performed of the distal gastroduodenal artery at the pancreaticoduodenal branch point with an assortment of Penumbra Ruby microcoils. Upon deployment of the final coil, a previously deployed coil became dislodged and traveled into the proximal proper hepatic artery. A micro snare was then used to attempt to capture the coil which was unsuccessful due to the snare being too small. Therefore, a Wholey wire was directed into the distal proper [place] artery. The base catheter and 5 French femoral sheath were removed. A 7 French, 55 cm tori guide sheath was then inserted and directed into the celiac ostium. A 6 [place] Ensnare was then used to capture and removed the displaced coil. Completion celiac angiogram demonstrated adequate embolization of the distal gastroduodenal artery without significant evidence of persistent enhancement about the superior aspect of the pancreatic head mass. Vasospasm was visualized in the right hepatic artery. The 7 French sheath was removed and exchanged for an 8 French Angio-Seal device which was deployed successfully to close the right common femoral artery. Distal pulses were unchanged. A sterile bandage was applied. The patient tolerated the procedure well was transferred back to the ICU in stable condition. IMPRESSION: 1. Hyperenhancing spherical mass about the pancreatic head with vascular supply from the superior and inferior pancreaticoduodenal arcades. No evidence of active gastrointestinal hemorrhage. 2. Given that this mass is the presumed etiology of gastrointestinal bleeding, coil embolization of the inferior and superior pancreatico duodenal arteries was performed successfully. Ruthann Cancer, MD [facility] Electronically Signed   By: Ruthann Cancer M.D.   On: 02/17/2022 09:52   IR EMBO ART  VEN HEMORR LYMPH EXTRAV  INC  GUIDE ROADMAPPING  Result Date: 02/17/2022 INDICATION: 88 year old female with chronic, worsening upper gastrointestinal hemorrhage with hypervascular pancreatic head mass visualized on CT earlier today, concerning for neuroendocrine tumor. EXAM: 1. Ultrasound-guided vascular access of the right common femoral artery. 2. Catheterization and angiography of the superior mesenteric and celiac arteries 3. Sub selective catheterization angiography of the inferior pancreaticoduodenal arcade and gastroduodenal arteries 4. Coil embolization of the inferior pancreaticoduodenal arcade and gastroduodenal artery. MEDICATIONS: None. ANESTHESIA/SEDATION: Moderate (conscious) sedation was employed during this procedure. A total of Versed 0 mg and Fentanyl 12.5 mcg was administered intravenously. Moderate Sedation Time: 196 minutes. The patient's level of consciousness and vital signs were monitored continuously by radiology nursing throughout the procedure under my direct supervision. CONTRAST:  44m OMNIPAQUE IOHEXOL 300 MG/ML SOLN, 1067mOMNIPAQUE IOHEXOL 300 MG/ML SOLN, 4043mMNIPAQUE IOHEXOL 300 MG/ML SOLN, 68m26mNIPAQUE IOHEXOL 300 MG/ML SOLN FLUOROSCOPY: Radiation Exposure Index (as provided by the fluoroscopic device): [phone] Kerma COMPLICATIONS: None immediate. PROCEDURE: Informed consent was obtained from the patient following explanation of the procedure, risks, benefits and alternatives. The patient understands, agrees and consents for the procedure. All questions were addressed. A time out was performed prior to the initiation of the procedure. Maximal barrier sterile technique utilized including caps, mask, sterile gowns, sterile gloves, large sterile drape, hand hygiene, and Betadine prep. Preprocedure ultrasound evaluation of the right common femoral artery demonstrated patency. The procedure was planned. Subdermal Local anesthesia was provided at the planned needle entry site. A small skin nick was made with  an 11 scalpel. Under direct ultrasound visualization, the right common femoral artery was punctured with a 21 gauge micropuncture needle. A permanent ultrasound image was captured and stored in the record. A microwire was introduced without difficulty. The needle was exchanged for a micropuncture sheath. Limited right lower extremity angiogram demonstrated adequate puncture  site for closure device use. A J wire was directed to the level of the abdominal aorta. The micropuncture sheath was exchanged for a 5 [place], 10 cm vascular sheath. A 5 French, C2 catheter was directed to the level of the superior mesenteric artery, however was unable to engage the ostium. Therefore, the catheter was exchanged over the wire for a Mickelson catheter which was used to select the superior mesenteric artery ostium. Superior mesenteric angiogram was performed which demonstrated patency of the superior mesenteric artery with hypervascular mass in the region of the pancreatic head which measured approximately 4.2 x 4.1 cm in frontal projection diameters. The mass vascular supply appeared to arise from a proximal branch of the superior mesenteric artery compatible with the inferior pancreaticoduodenal arcade. There is no definite evidence of active extravasation into the gastrointestinal lumen. A Progreat Omega microcatheter and fathom 16 microwire were used to attempt to select the inferior pancreaticoduodenal arcade, however this was very challenging due to angulation and position of the base catheter in the SMA. A combination of catheters and wires were attempted which was eventually successful with a 5 [place] RIM catheter in the SMA, a 2.5 [place] cantata microcatheter, and 0.018 inch double angle glide GT wire. Angiogram of the inferior pancreaticoduodenal arcade was performed which demonstrated primary supply to the inferior aspect of the spherical pancreatic head mass. Again, no evidence of definite gastric extravasation into the  duodenal lumen was observed. There was early venous outflow via peripheral portal vein branches concerning for possible arteriovenous shunting. An assortment of Penumbra Ruby microcoils were then used to embolize the distal branches of the inferior pancreticoduodenal arcade, preserving flow to a proximal duodenal branch arising from the parent vessel. Repeat angiogram from proximal in the pancreaticoduodenal parent branch demonstrated complete embolization of the inferior aspect of the mass. The microcatheter was then removed and selection of the celiac trunk was pursued. A 5 [place] Mickelson catheter was engaged in the celiac trunk and celiac angiogram was performed. Angiogram was significant for patent splenic and common hepatic arteries with supply arising from the gastroduodenal artery to the more superior aspect of the spherical mass. Again, no evidence of active gastrointestinal hemorrhage was observed. The gastroduodenal artery was then selected the Progreat microcatheter and fathom wire. Angiogram from the proximal duodenal artery demonstrated tumor blush arising from the superior pancreaticoduodenal branches. Therefore, coil embolization was performed of the distal gastroduodenal artery at the pancreaticoduodenal branch point with an assortment of Penumbra Ruby microcoils. Upon deployment of the final coil, a previously deployed coil became dislodged and traveled into the proximal proper hepatic artery. A micro snare was then used to attempt to capture the coil which was unsuccessful due to the snare being too small. Therefore, a Wholey wire was directed into the distal proper [place] artery. The base catheter and 5 French femoral sheath were removed. A 7 French, 55 cm tori guide sheath was then inserted and directed into the celiac ostium. A 6 [place] Ensnare was then used to capture and removed the displaced coil. Completion celiac angiogram demonstrated adequate embolization of the distal gastroduodenal  artery without significant evidence of persistent enhancement about the superior aspect of the pancreatic head mass. Vasospasm was visualized in the right hepatic artery. The 7 French sheath was removed and exchanged for an 8 French Angio-Seal device which was deployed successfully to close the right common femoral artery. Distal pulses were unchanged. A sterile bandage was applied. The patient tolerated the procedure well was transferred back to the  ICU in stable condition. IMPRESSION: 1. Hyperenhancing spherical mass about the pancreatic head with vascular supply from the superior and inferior pancreaticoduodenal arcades. No evidence of active gastrointestinal hemorrhage. 2. Given that this mass is the presumed etiology of gastrointestinal bleeding, coil embolization of the inferior and superior pancreatico duodenal arteries was performed successfully. Ruthann Cancer, MD [facility] Radiology Electronically Signed   By: Ruthann Cancer M.D.   On: 02/17/2022 09:52   IR Angiogram Visceral Selective  Result Date: 02/17/2022 INDICATION: 88 year old female with chronic, worsening upper gastrointestinal hemorrhage with hypervascular pancreatic head mass visualized on CT earlier today, concerning for neuroendocrine tumor. EXAM: 1. Ultrasound-guided vascular access of the right common femoral artery. 2. Catheterization and angiography of the superior mesenteric and celiac arteries 3. Sub selective catheterization angiography of the inferior pancreaticoduodenal arcade and gastroduodenal arteries 4. Coil embolization of the inferior pancreaticoduodenal arcade and gastroduodenal artery. MEDICATIONS: None. ANESTHESIA/SEDATION: Moderate (conscious) sedation was employed during this procedure. A total of Versed 0 mg and Fentanyl 12.5 mcg was administered intravenously. Moderate Sedation Time: 196 minutes. The patient's level of consciousness and vital signs were monitored  continuously by radiology nursing throughout the procedure under my direct supervision. CONTRAST:  1m OMNIPAQUE IOHEXOL 300 MG/ML SOLN, 1013mOMNIPAQUE IOHEXOL 300 MG/ML SOLN, 4023mMNIPAQUE IOHEXOL 300 MG/ML SOLN, 22m37mNIPAQUE IOHEXOL 300 MG/ML SOLN FLUOROSCOPY: Radiation Exposure Index (as provided by the fluoroscopic device): [phone] Kerma COMPLICATIONS: None immediate. PROCEDURE: Informed consent was obtained from the patient following explanation of the procedure, risks, benefits and alternatives. The patient understands, agrees and consents for the procedure. All questions were addressed. A time out was performed prior to the initiation of the procedure. Maximal barrier sterile technique utilized including caps, mask, sterile gowns, sterile gloves, large sterile drape, hand hygiene, and Betadine prep. Preprocedure ultrasound evaluation of the right common femoral artery demonstrated patency. The procedure was planned. Subdermal Local anesthesia was provided at the planned needle entry site. A small skin nick was made with an 11 scalpel. Under direct ultrasound visualization, the right common femoral artery was punctured with a 21 gauge micropuncture needle. A permanent ultrasound image was captured and stored in the record. A microwire was introduced without difficulty. The needle was exchanged for a micropuncture sheath. Limited right lower extremity angiogram demonstrated adequate puncture site for closure device use. A J wire was directed to the level of the abdominal aorta. The micropuncture sheath was exchanged for a 5 [place] cm vascular sheath. A 5 French, C2 catheter was directed to the level of the superior mesenteric artery, however was unable to engage the ostium. Therefore, the catheter was exchanged over the wire for a Mickelson catheter which was used to select the superior mesenteric artery ostium. Superior mesenteric angiogram was performed which demonstrated patency of the superior  mesenteric artery with hypervascular mass in the region of the pancreatic head which measured approximately 4.2 x 4.1 cm in frontal projection diameters. The mass vascular supply appeared to arise from a proximal branch of the superior mesenteric artery compatible with the inferior pancreaticoduodenal arcade. There is no definite evidence of active extravasation into the gastrointestinal lumen. A Progreat Omega microcatheter and fathom 16 microwire were used to attempt to select the inferior pancreaticoduodenal arcade, however this was very challenging due to angulation and position of the base catheter in the SMA. A combination of catheters and wires were attempted which was eventually successful with a 5 [place] catheter in the SMA, a 2.5 [place]ren[place]  cantata microcatheter, and 0.018 inch double angle glide GT wire. Angiogram of the inferior pancreaticoduodenal arcade was performed which demonstrated primary supply to the inferior aspect of the spherical pancreatic head mass. Again, no evidence of definite gastric extravasation into the duodenal lumen was observed. There was early venous outflow via peripheral portal vein branches concerning for possible arteriovenous shunting. An assortment of Penumb[place] Ruby microcoils were then used to embolize the distal branches of the inferior pancreticoduodenal arcade, preserving flow to a proximal duodenal branch arising from the parent vessel. Repeat angiogram from proximal in the pancreaticoduodenal parent branch demonstrated complete embolization of the inferior aspect of the mass. The microcatheter was then removed and selection of the celiac trunk was pursued. A 5 [place] Mickelson catheter was engaged in the celiac trunk and celiac angiogram was performed. Angiogram was significant for patent splenic and common hepatic arteries with supply arising from the gastroduodenal artery to the more superior aspect of the spherical mass. Again, no evidence of active gastrointestinal  hemorrhage was observed. The gastroduodenal artery was then selected the Progreat microcatheter and fathom wire. Angiogram from the proximal duodenal artery demonstrated tumor blush arising from the superior pancreaticoduodenal branches. Therefore, coil embolization was performed of the distal gastroduodenal artery at the pancreaticoduodenal branch point with an assortment of Penumbra Ruby microcoils. Upon deployment of the final coil, a previously deployed coil became dislodged and traveled into the proximal proper hepatic artery. A micro snare was then used to attempt to capture the coil which was unsuccessful due to the snare being too small. Therefore, a Wholey wire was directed into the distal proper [place] artery. The base catheter and 5 French femoral sheath were removed. A 7 French, 55 cm tori guide sheath was then inserted and directed into the celiac ostium. A 6 [place] Ensnare was then used to capture and removed the displaced coil. Completion celiac angiogram demonstrated adequate embolization of the distal gastroduodenal artery without significant evidence of persistent enhancement about the superior aspect of the pancreatic head mass. Vasospasm was visualized in the right hepatic artery. The 7 French sheath was removed and exchanged for an 8 French Angio-Seal device which was deployed successfully to close the right common femoral artery. Distal pulses were unchanged. A sterile bandage was applied. The patient tolerated the procedure well was transferred back to the ICU in stable condition. IMPRESSION: 1. Hyperenhancing spherical mass about the pancreatic head with vascular supply from the superior and inferior pancreaticoduodenal arcades. No evidence of active gastrointestinal hemorrhage. 2. Given that this mass is the presumed etiology of gastrointestinal bleeding, coil embolization of the inferior and superior pancreatico duodenal arteries was performed successfully. Ruthann Cancer, MD [facility] Radiology Electronically Signed   By: Ruthann Cancer M.D.   On: 02/17/2022 09:52   CT Angio Abd/Pel w/ and/or w/o  Result Date: 02/16/2022 CLINICAL DATA:  GI bleed, suspected small bowel bleeding, small bowel AVMs and ulcers * Tracking Code: BO * EXAM: CTA ABDOMEN AND PELVIS WITHOUT AND WITH CONTRAST TECHNIQUE: Multidetector CT imaging of the abdomen and pelvis was performed using the standard protocol during bolus administration of intravenous contrast. Multiplanar reconstructed images and MIPs were obtained and reviewed to evaluate the vascular anatomy. RADIATION DOSE REDUCTION: This exam was performed according to the departmental dose-optimization program which includes automated exposure control, adjustment of the mA and/or kV according to patient size and/or use of iterative reconstruction technique. CONTRAST:  31m OMNIPAQUE IOHEXOL 350 MG/ML SOLN COMPARISON:  CT abdomen pelvis, 02/25/2019  FINDINGS: VASCULAR Normal contour and caliber of the abdominal aorta. No evidence of aneurysm, dissection or other acute aortic pathology. Standard branching pattern of the abdominal aorta, including a solitary left renal artery. Severe mixed calcific atherosclerosis. Bilateral common iliac artery origins. Review of the MIP images confirms the above findings. NON-VASCULAR Lower chest: Moderate bilateral pleural effusions. Coronary artery calcifications. Hepatobiliary: Subtly enhancing subcapsular lesion of the posterior inferior right lobe of the liver, hepatic segment VI, measuring 1.1 x 1.0 cm (series 8, image 47). This lesion demonstrates greater enhancement on portal venous phase, in contrast to pancreatic lesions. Benign subcapsular cyst of the anterior left lobe of the liver (series 8, image 14). Status post cholecystectomy. No biliary dilatation. Pancreas: New, or at least markedly enlarged, markedly arterially hyperenhancing mass of the inferior pancreatic head and  uncinate measuring 4.7 x 3.9 cm (series 6, image 59). This appears to closely involve the adjacent transverse portion of the duodenal with large collaterals about the duodenal wall (series 6, image 64). There are two similarly hyperenhancing nodule of the pancreatic tail, measuring 1.1 x 1.1 cm and 1.0 x 1.0 cm (series 6, image 51). No pancreatic ductal dilatation or surrounding inflammatory changes. Spleen: Normal in size without significant abnormality. Adrenals/Urinary Tract: Adrenal glands are unremarkable. Status post right nephrectomy and adrenalectomy. The left kidney is normal, without renal calculi, solid lesion, or hydronephrosis. Limited visualization of the urinary bladder secondary to dense metallic streak artifact from bilateral hip arthroplasty. Stomach/Bowel: Stomach is within normal limits. Appendix is not clearly visualized. No evidence of bowel wall thickening, distention, or inflammatory changes. Sigmoid diverticulosis. Vascular/Lymphatic: No significant vascular findings are present. No enlarged abdominal or pelvic lymph nodes. Reproductive: Status post hysterectomy. Other: No abdominal wall hernia or abnormality. Small volume free fluid in the low pelvis (series 6, image 45). Musculoskeletal: Status post bilateral hip total arthroplasty. Unchanged wedge deformity of L2. IMPRESSION: 1. New, or at least markedly enlarged, arterially hyperenhancing mass of the inferior pancreatic head and uncinate measuring 4.7 x 3.9 cm. This appears to closely involve the adjacent transverse portion of the duodenum with large collaterals about the duodenal wall. 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 Given involvement of the duodenal, this is most likely source of reported GI bleeding. 2. 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 3. Subtly enhancing subcapsular lesion  of the posterior inferior right lobe of the liver, hepatic segment VI, measuring 1.1 x 1.0 cm. This lesion demonstrates greater enhancement on portal venous phase, in contrast to pancreatic lesions suggesting an incidental, benign hepatic lesion rather than a metastasis. Attention on follow-up. 4. No other findings within the bowel localize GI bleeding. Sigmoid diverticulosis without evidence of intraluminal contrast in the lower GI tract. 5. Moderate bilateral pleural effusions. 6. Small volume free fluid in the low pelvis. 7. Status post right nephrectomy and adrenalectomy. 8. Coronary artery disease. Electronically Signed   By: Delanna Ahmadi M.D.   On: 02/16/2022 17:04   ECHOCARDIOGRAM COMPLETE  Result Date: 02/15/2022    ECHOCARDIOGRAM REPORT   Patient Name:   [name] Date of Exam: 02/15/2022 Medical Rec #:  [id number]         Height:       66.0 in Accession #:    [id number]        Weight:       142.0 lb Date of Birth:  Sep 01, 1933  BSA:          1.729 m Patient Age:    28 years          BP:           125/52 mmHg Patient Gender: F                 HR:           52 bpm. Exam Location:  Inpatient Procedure: 2D Echo, Cardiac Doppler and Color Doppler Indications:    Elevated troponin  History:        Patient has prior history of Echocardiogram examinations, most                 recent 06/27/2021. CHF; CAD.  Sonographer:    Jefferey Pica Referring Phys: [place]  1. Left ventricular ejection fraction, by estimation, is 60 to 65%. The left ventricle has normal function. The left ventricle has no regional wall motion abnormalities. There is moderate concentric left ventricular hypertrophy. Left ventricular diastolic parameters are consistent with Grade II diastolic dysfunction (pseudonormalization). There is the interventricular septum is flattened in systole and diastole, consistent with right ventricular pressure and volume overload.  2. Right ventricular systolic  function is normal. The right ventricular size is mildly enlarged. There is severely elevated pulmonary artery systolic pressure.  3. Left atrial size was moderately dilated.  4. Right atrial size was moderately dilated.  5. A small pericardial effusion is present. Large pleural effusion.  6. Mild mitral valve regurgitation.  7. Tricuspid valve regurgitation is moderate to severe.  8. The aortic valve is calcified. Aortic valve regurgitation is mild. Mild aortic valve stenosis.  9. The inferior vena cava is dilated in size with <50% respiratory variability, suggesting right atrial pressure of 15 mmHg. Comparison(s): No significant change from prior study. FINDINGS  Left Ventricle: Left ventricular ejection fraction, by estimation, is 60 to 65%. The left ventricle has normal function. The left ventricle has no regional wall motion abnormalities. The left ventricular internal cavity size was normal in size. There is  moderate concentric left ventricular hypertrophy. Abnormal (paradoxical) septal motion, consistent with RV pacemaker and the interventricular septum is flattened in systole and diastole, consistent with right ventricular pressure and volume overload. Left ventricular diastolic parameters are consistent with Grade II diastolic dysfunction (pseudonormalization). Right Ventricle: The right ventricular size is mildly enlarged. Right ventricular systolic function is normal. There is severely elevated pulmonary artery systolic pressure. The tricuspid regurgitant velocity is 4.32 m/s, and with an assumed right atrial  pressure of 15 mmHg, the estimated right ventricular systolic pressure is 29.5 mmHg. Left Atrium: Left atrial size was moderately dilated. Right Atrium: Right atrial size was moderately dilated. Pericardium: A small pericardial effusion is present. Mitral Valve: Mild mitral valve regurgitation. MV peak gradient, 20.4 mmHg. The mean mitral valve gradient is 5.0 mmHg. Tricuspid Valve: Tricuspid valve  regurgitation is moderate to severe. Aortic Valve: The aortic valve is calcified. Aortic valve regurgitation is mild. Aortic regurgitation PHT measures 337 msec. Mild aortic stenosis is present. Aortic valve peak gradient measures 12.5 mmHg. Pulmonic Valve: Pulmonic valve regurgitation is trivial. Aorta: The aortic root and ascending aorta are structurally normal, with no evidence of dilitation. Venous: The inferior vena cava is dilated in size with less than 50% respiratory variability, suggesting right atrial pressure of 15 mmHg. IAS/Shunts: No atrial level shunt detected by color flow Doppler. Additional Comments: A device lead is visualized. There is a large pleural  effusion.  LEFT VENTRICLE PLAX 2D LVIDd:         3.80 cm LVIDs:         2.60 cm LV PW:         1.45 cm LV IVS:        1.15 cm LVOT diam:     1.80 cm LV SV:         70 LV SV Index:   40 LVOT Area:     2.54 cm  RIGHT VENTRICLE             IVC RV Basal diam:  2.90 cm     IVC diam: 2.10 cm RV S prime:     10.80 cm/s TAPSE (M-mode): 1.6 cm LEFT ATRIUM             Index        RIGHT ATRIUM           Index LA diam:        4.60 cm 2.66 cm/m   RA Area:     29.60 cm LA Vol (A2C):   62.1 ml 35.92 ml/m  RA Volume:   116.00 ml 67.09 ml/m LA Vol (A4C):   67.2 ml 38.87 ml/m LA Biplane Vol: 69.1 ml 39.97 ml/m  AORTIC VALVE                 PULMONIC VALVE AV Area (Vmax): 1.73 cm     PV Vmax:       1.03 m/s AV Vmax:        176.50 cm/s  PV Peak grad:  4.2 mmHg AV Peak Grad:   12.5 mmHg LVOT Vmax:      120.00 cm/s LVOT Vmean:     75.400 cm/s LVOT VTI:       0.274 m AI PHT:         337 msec  AORTA Ao Root diam: 2.90 cm Ao Asc diam:  2.90 cm MITRAL VALVE                TRICUSPID VALVE MV Area (PHT): 4.41 cm     TR Peak grad:   74.6 mmHg MV Area VTI:   1.22 cm     TR Vmax:        432.00 cm/s MV Peak grad:  20.4 mmHg MV Mean grad:  5.0 mmHg     SHUNTS MV Vmax:       2.26 m/s     Systemic VTI:  0.27 m MV Vmean:      90.6 cm/s    Systemic Diam: 1.80 cm MV Decel Time:  172 msec MV E velocity: 185.00 cm/s MV A velocity: 75.80 cm/s MV E/A ratio:  2.44 Mary Scientist, physiological signed by Phineas Inches Signature Date/Time: 02/15/2022/3:01:50 PM    Final    DG CHEST PORT 1 VIEW  Result Date: 02/14/2022 CLINICAL DATA:  Hypoxemia.  COVID positive. EXAM: PORTABLE CHEST 1 VIEW COMPARISON:  Chest two views 02/13/2022 FINDINGS: Left chest wall cardiac AICD with leads overlying the right atrium and right ventricle. Cardiac silhouette is again mildly enlarged. Mediastinal contours are within normal limits. Moderate calcification within the aortic arch. Unchanged chronic mild bilateral interstitial thickening. Small left pleural effusion with left basilar heterogeneous airspace opacity, similar to 02/13/2022. No pneumothorax is seen. Mild multilevel degenerative disc changes of the thoracic spine. Chronic sclerotic focus within the inferior medial left humeral head appears unchanged from multiple prior radiographs including 02/24/2019, benign. IMPRESSION: 1. Small left pleural  effusion with left basilar heterogeneous airspace opacity, similar to 02/13/2022. This is suspicious for atelectasis versus pneumonia. 2. Unchanged mild cardiomegaly. Electronically Signed   By: Yvonne Kendall M.D.   On: 02/14/2022 11:56   DG Chest 2 View  Result Date: 02/13/2022 CLINICAL DATA:  Post COVID low hemoglobin fatigue EXAM: CHEST - 2 VIEW COMPARISON:  03/08/2021 FINDINGS: Left-sided pacing device as before. Small bilateral pleural effusions. Stable cardiomediastinal silhouette with aortic atherosclerosis. No consolidation or pneumothorax. Chronic compression fracture of the upper lumbar spine. Stable sclerotic focus in the proximal left humerus. IMPRESSION: Small bilateral pleural effusions Electronically Signed   By: Donavan Foil M.D.   On: 02/13/2022 19:23    Labs:  CBC: Recent Labs    02/15/22 0728 02/15/22 1848 02/16/22 0454 02/16/22 1213 02/17/22 0300  WBC 6.2 5.5 6.9  --  8.8  HGB  8.5* 7.6* 7.1* 7.0* 6.8*  HCT 25.3* 24.0* 21.6* 22.2* 20.8*  PLT 133* 161 164  --  195    COAGS: Recent Labs    02/13/22 1625  INR 1.2    BMP: Recent Labs    02/14/22 1241 02/15/22 0130 02/16/22 0454 02/17/22 0300  NA 138 137 135 137  K 3.4* 3.8 3.7 3.5  CL 105 103 104 106  CO2 '23 26 25 24  '$ GLUCOSE 89 153* 144* 109*  BUN 49* 43* 46* 38*  CALCIUM 7.9* 8.0* 8.0* 8.3*  CREATININE 1.42* 1.40* 1.33* 1.40*  GFRNONAA 36* 37* 39* 37*    LIVER FUNCTION TESTS: Recent Labs    02/13/22 1625 02/14/22 1241 02/15/22 0130 02/16/22 0454  BILITOT 0.6 1.8* 1.1 0.6  AST 21 25 33 28  ALT '14 12 14 16  '$ ALKPHOS 36* 35* 42 32*  PROT 5.5* 5.1* 5.1* 4.9*  ALBUMIN 3.0* 2.6* 2.6* 2.5*    Assessment and Plan:  88 y/o s/p pancreaticoduodenal arcade and GDA coil embolization for GI bleeding yesterday in IR. No active intraluminal extravasation during procedure however given hyperenhancing pancreatic head mass with supply from inferior pancreaticoduodenal arcade branches and superior supply from GDA decision was made to perform embolization of above vessels as this was felt to be most likely source of bleeding.  Patient seen in ICU today, states she feels ok, no melena/hematochezia this morning. Right CFA puncture site soft, appropriately tender, non-pulsatile. Abdomen soft, non tender. Hgb 6.8 this AM (7.0 and 7.1 pre-embolization).   Plan: - May remove right CFA dressing 24 hours post procedure, may shower but do not submerge puncture site for 7 days - Does not need to be on bedrest from IR perspective - Per Dr. Serafina Royals expect melena to diminish over next 24-48 hours, however if continues or worsens consider tagged RBC nuclear medicine study  No further IR needs at this time, plans per GI/primary team.  IR remains available as needed, please call with questions or concerns.  Electronically Signed: Joaquim Nam, PA-C 02/17/2022, 10:15 AM   I spent a total of 15 Minutes at the  the patient's bedside AND on the patient's [name] floor or unit, greater than 50% of which was counseling/coordinating care for GI bleed embolization follow up.

## 2022-02-17 NOTE — Progress Notes (Signed)
[facility] Progress Note  Chief Complaint: Melena  History:  Ms. 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  Dr. Serafina Royals did not see any active bleeding on mesenteric angiogram, though the overall clinical scenario favored this being the most likely source.  She therefore had coil embolization of the GDA and pancreaticoduodenal arcade. Hemoglobin is only dropped slightly from 7.1 yesterday morning to 6.8 this morning without transfusion (she is still quite reluctant to receive PRBC transfusion due to previous reactions). She has been hemodynamically stable overnight as well. Denies abdominal pain has nausea but says ice chips have helped.  ROS: Cardiovascular: Denies chest pain Respiratory: Chronic dyspnea and recent cough due to COVID Urinary: Denies dysuria  Objective:   Current Facility-Administered Medications:    0.9 %  sodium chloride infusion (Manually program via Guardrails IV Fluids), , Intravenous, Once, Caren Griffins, MD, Held at 02/14/22 0825   0.9 %  sodium chloride infusion (Manually program via Guardrails IV Fluids), , Intravenous, Once, Gherghe, Vella Redhead, MD   acetaminophen (TYLENOL) tablet 650 mg, 650 mg, Oral, Q6H PRN **OR** acetaminophen (TYLENOL) suppository 650 mg, 650 mg, Rectal, Q6H PRN, Doutova, Anastassia, MD   albuterol (PROVENTIL) (2.5 MG/3ML) 0.083% nebulizer solution 2.5 mg, 2.5 mg, Nebulization, Q2H PRN, Doutova, Anastassia, MD   Chlorhexidine Gluconate Cloth 2 % PADS 6 each, 6 each, Topical, Daily, Candee Furbish, MD, 6 each at 02/16/22 1325   EPINEPHrine (ADRENALIN) 5 mg in NS 250 mL (0.02 mg/mL) premix infusion, 0.5-20 mcg/min, Intravenous, Titrated, Candee Furbish, MD   ezetimibe (ZETIA) tablet 10 mg, 10 mg, Oral, Daily, Doutova, Anastassia, MD, 10 mg at 02/16/22 0820   guaiFENesin-dextromethorphan (ROBITUSSIN DM) 100-10 MG/5ML syrup 5 mL, 5 mL, Oral, Q4H PRN, Cruzita Lederer,  Costin M, MD   lactated ringers bolus 500 mL, 500 mL, Intravenous, Once PRN, Candee Furbish, MD   methylPREDNISolone sodium succinate (SOLU-MEDROL) 40 mg/mL injection 40 mg, 40 mg, Intravenous, Once PRN, Cruzita Lederer, Vella Redhead, MD   ondansetron (ZOFRAN) injection 4 mg, 4 mg, Intravenous, Q6H PRN, Roel Cluck, Anastassia, MD, 4 mg at 02/17/22 0804   Oral care mouth rinse, 15 mL, Mouth Rinse, PRN, Candee Furbish, MD   pantoprazole (PROTONIX) EC tablet 40 mg, 40 mg, Oral, BID, Sharyn Creamer, MD, 40 mg at 02/16/22 0820   promethazine (PHENERGAN) 6.25 mg in sodium chloride 0.9 % 50 mL IVPB, 6.25 mg, Intravenous, Once, Candee Furbish, MD   rosuvastatin (CRESTOR) tablet 40 mg, 40 mg, Oral, Daily, Doutova, Anastassia, MD, 40 mg at 02/16/22 0820   epinephrine     lactated ringers     promethazine (PHENERGAN) injection (IM or IVPB)       Vital signs in last 24 hrs: Vitals:   02/17/22 0700 02/17/22 0800  BP: (!) 131/39 (!) 111/58  Pulse: 64 75  Resp: (!) 25 (!) 22  Temp:  98.6 F (37 C)  SpO2: (!) 89% 96%    Intake/Output Summary (Last 24 hours) at 02/17/2022 0829 Last data filed at 02/17/2022 3710 Gross per 24 hour  Intake 100 ml  Output 680 ml  Net -580 ml     Physical Exam Fatigued and acutely ill-appearing but in significantly less distressed than when I saw her yesterday morning. Mucous membranes dry Cardiac: RRR without murmurs, S1S2 heard, no peripheral edema Pulm: clear to auscultation bilaterally,, no respiratory distress Abdomen: soft, no tenderness, with active bowel sounds. No distention  Skin; warm and dry, pale, no jaundice  Recent Labs:     Latest Ref Rng & Units 02/17/2022    3:00 AM 02/16/2022   12:13 PM 02/16/2022    4:54 AM  CBC  WBC 4.0 - 10.5 K/uL 8.8   6.9   Hemoglobin 12.0 - 15.0 g/dL 6.8  7.0  7.1   Hematocrit 36.0 - 46.0 % 20.8  22.2  21.6   Platelets 150 - 400 K/uL 195   164     Recent Labs  Lab 02/13/22 1625  INR 1.2      Latest Ref Rng & Units  02/17/2022    3:00 AM 02/16/2022    4:54 AM 02/15/2022    1:30 AM  CMP  Glucose 70 - 99 mg/dL 109  144  153   BUN 8 - 23 mg/dL 38  46  43   Creatinine 0.44 - 1.00 mg/dL 1.40  1.33  1.40   Sodium 135 - 145 mmol/L 137  135  137   Potassium 3.5 - 5.1 mmol/L 3.5  3.7  3.8   Chloride 98 - 111 mmol/L 106  104  103   CO2 22 - 32 mmol/L '24  25  26   '$ Calcium 8.9 - 10.3 mg/dL 8.3  8.0  8.0   Total Protein 6.5 - 8.1 g/dL  4.9  5.1   Total Bilirubin 0.3 - 1.2 mg/dL  0.6  1.1   Alkaline Phos 38 - 126 U/L  32  42   AST 15 - 41 U/L  28  33   ALT 0 - 44 U/L  16  14      Radiologic studies: IR procedure reports reviewed and on file  Assessment & Plan  Assessment: Melena Acute blood loss anemia 2 nonbleeding small bowel AVMs treated this admission,.  Capsule study within the last few months shows probable distal ileal ulcers.  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   She does not appear to have ongoing bleeding after yesterday's intervention. Plan: Continue clear liquids for now Every 8 hour hemoglobin and hematocrit Critical care plans to discuss risk and benefits of transfusion with her and her daughter.  IV iron was given yesterday but will naturally take some time to improve hemoglobin.  I do not yet know if/when EUS or other diagnostic modality may be feasible regarding his pancreatic head mass.  Since it was devascularized in some fashion yesterday, perhaps it may no longer be clinically significant if there is no recurrent bleeding and since it is not causing biliary obstruction or other obvious symptoms. Her age and multiple significant medical comorbidities are such that she is not a surgical candidate.  Our service will follow, Dr. Fuller Plan to manage the consult service starting tomorrow.  Nelida Meuse III Office: [phone]

## 2022-02-17 NOTE — Plan of Care (Signed)
  Problem: Education: Goal: Knowledge of General Education information will improve Description: Including pain rating scale, medication(s)/side effects and non-pharmacologic comfort measures Outcome: Progressing   Problem: Health Behavior/Discharge Planning: Goal: Ability to manage health-related needs will improve Outcome: Progressing   Problem: Clinical Measurements: Goal: Will remain free from infection Outcome: Progressing Goal: Diagnostic test results will improve Outcome: Progressing Goal: Respiratory complications will improve Outcome: Progressing Goal: Cardiovascular complication will be avoided Outcome: Progressing   Problem: Activity: Goal: Risk for activity intolerance will decrease Outcome: Progressing   Problem: Coping: Goal: Level of anxiety will decrease Outcome: Progressing   Problem: Elimination: Goal: Will not experience complications related to urinary retention Outcome: Progressing   Problem: Pain Managment: Goal: General experience of comfort will improve Outcome: Progressing   Problem: Safety: Goal: Ability to remain free from injury will improve Outcome: Progressing   Problem: Skin Integrity: Goal: Risk for impaired skin integrity will decrease Outcome: Progressing   Problem: Cardiovascular: Goal: Vascular access site(s) Level 0-1 will be maintained Outcome: Progressing

## 2022-02-17 NOTE — Progress Notes (Signed)
   NAME:  [name], MRN:  [id number], DOB:  August 31, 1933, LOS: 4 ADMISSION DATE:  02/13/2022, CONSULTATION DATE:  02/14/2022 REFERRING MD:  Dr. Cruzita Lederer, [facility], CHIEF COMPLAINT:  Melena   History of Present Illness:  88 yo female brought to ER due to low Hb and 3 days of melena.  She has hx of AVMs.  She is on eliquis for a fib.  She was diagnosed with COVID 6 days prior to admission.  GI consulted.  On 10/12 she developed rigors with hypoxia and concern for transfusion reaction.  Transfusion stopped and given solumedrol.  Developed hypotension and fever.  Antibiotics started.  She complained of chest pain and troponin elevated.  PCCM initially consulted 10/12 for melena, at which time she did not require ICU transfer PCCM reconsulted 10/14 for melena with need for blood transfusion. In light of hx transfusion reactions, pt to be transferred to ICU   Pertinent  Medical History  A fib, GI bleed with AVMs, HTN, Hypothyroidism, Anxiety, OA, CKD, CAD, HLD, Renal cell cancer s/p Rt nephrectomy, Pulmonary hypertension, Aortic stenosis, COPD, non ischemic CM, s/p ICD, Celiac and SMA stenoses  Significant Hospital Events: Including procedures, antibiotic start and stop dates in addition to other pertinent events   10/11 Admit, GI consulted.  10/12 possible transfusion reaction 10/13 gastritis, 2 nonbleeding AVMs s/p cautery on enteroscopy  10/14 hgb cont to drop, down to 7.1   Interim History / Subjective:  No distress +nausea  Objective   Blood pressure (!) 131/39, pulse 64, temperature 98.7 F (37.1 C), temperature source Oral, resp. rate (!) 25, height '5\' 6"'$  (1.676 m), weight 64.4 kg, SpO2 (!) 89 %.    FiO2 (%):  [3 %] 3 %   Intake/Output Summary (Last 24 hours) at 02/17/2022 0746 Last data filed at 02/16/2022 1700 Gross per 24 hour  Intake --  Output 330 ml  Net -330 ml    Filed Weights   02/13/22 1507  Weight: 64.4 kg    Examination: No distress MM dry Heart sounds  regular Moves all 4 ext to command No edema +arthritis changes  Stable Cr K low, repleting  Resolved Hospital Problem list     Assessment & Plan:    ABLA due to GIB- culprit thought to be pancreatic NET tumor s/p embolization Transfusion reaction - 2 yrs ago and on 10/12; diaphoresis and shock state - Monitor melena- seems to be improving - q12h CBC, risks benefits of transfusion needs to be discussed. Has received IV iron.   NSTEMI Hx CAD Chronic diastolic HF NICM s/p AICD Permanent aFib on Eliquis  Pulmonary venous hypertension AS  HLD -hold eliquis  -zetia, crestor   Recent COVID infection with possible pneumonia. -received antivirals and steroids starting 10/5 prior to admission -received 5 doses of zosyn starting 10/12 Off isolation P Monitor fever curve  CKD 3b Hx renal cell carcinoma s/p R nephrectomy  - follow renal fxn, UOP    Best Practice (right click and "Reselect all SmartList Selections" daily)   Diet/type: clear liquid DVT prophylaxis: SCD GI prophylaxis: PPI Lines: N/A Foley:  N/A Code Status:  full code Last date of multidisciplinary goals of care discussion '[x]'$   Keep in ICU until we decide on transfusion  Erskine Emery MD PCCM

## 2022-02-17 NOTE — Progress Notes (Signed)
Lots of nausea and weakness through day. Per daughter this is how she has manifested her anemia in past. In light of this, borderline BP will premedicate and transfuse 2 units very slowly (50cc/hr, ~7 hours per unit).  Premedicating with benadryl and steroids.  Epi drip on standby.  Erskine Emery MD PCCM

## 2022-02-17 NOTE — Progress Notes (Signed)
[facility] Progress Note Patient Name: [name] DOB: April 09, 1934 MRN: [id number]   Date of Service  02/17/2022  HPI/Events of Note  Notified of hemoglobin of 6.8 Hgb has slowly been downtrending, from 7.1 yesterday.  No evidence of ongoing bleed at this time. Pt remains hemodynamically stable.   eICU Interventions  Since pt is a asymptomatic and given prior reaction to blood products, will hold off transfusion at this time.         [place] 02/17/2022, 3:51 AM

## 2022-02-18 ENCOUNTER — Encounter (HOSPITAL_COMMUNITY): Payer: Self-pay

## 2022-02-18 ENCOUNTER — Other Ambulatory Visit: Payer: Self-pay

## 2022-02-18 ENCOUNTER — Telehealth: Payer: Self-pay

## 2022-02-18 DIAGNOSIS — Z7901 Long term (current) use of anticoagulants: Secondary | ICD-10-CM | POA: Diagnosis not present

## 2022-02-18 DIAGNOSIS — K8689 Other specified diseases of pancreas: Secondary | ICD-10-CM

## 2022-02-18 DIAGNOSIS — D62 Acute posthemorrhagic anemia: Secondary | ICD-10-CM | POA: Diagnosis not present

## 2022-02-18 DIAGNOSIS — N1832 Chronic kidney disease, stage 3b: Secondary | ICD-10-CM | POA: Diagnosis not present

## 2022-02-18 DIAGNOSIS — K922 Gastrointestinal hemorrhage, unspecified: Secondary | ICD-10-CM | POA: Diagnosis not present

## 2022-02-18 DIAGNOSIS — I4821 Permanent atrial fibrillation: Secondary | ICD-10-CM | POA: Diagnosis not present

## 2022-02-18 HISTORY — PX: IR ANGIOGRAM SELECTIVE EACH ADDITIONAL VESSEL: IMG667

## 2022-02-18 LAB — CBC
HCT: 25.1 % — ABNORMAL LOW (ref 36.0–46.0)
HCT: 25.6 % — ABNORMAL LOW (ref 36.0–46.0)
Hemoglobin: 8.3 g/dL — ABNORMAL LOW (ref 12.0–15.0)
Hemoglobin: 8.5 g/dL — ABNORMAL LOW (ref 12.0–15.0)
MCH: 29.1 pg (ref 26.0–34.0)
MCH: 29.4 pg (ref 26.0–34.0)
MCHC: 33.1 g/dL (ref 30.0–36.0)
MCHC: 33.2 g/dL (ref 30.0–36.0)
MCV: 88.1 fL (ref 80.0–100.0)
MCV: 88.6 fL (ref 80.0–100.0)
Platelets: 130 10*3/uL — ABNORMAL LOW (ref 150–400)
Platelets: 149 10*3/uL — ABNORMAL LOW (ref 150–400)
RBC: 2.85 MIL/uL — ABNORMAL LOW (ref 3.87–5.11)
RBC: 2.89 MIL/uL — ABNORMAL LOW (ref 3.87–5.11)
RDW: 16.8 % — ABNORMAL HIGH (ref 11.5–15.5)
RDW: 16.8 % — ABNORMAL HIGH (ref 11.5–15.5)
WBC: 8.7 10*3/uL (ref 4.0–10.5)
WBC: 12.6 10*3/uL — ABNORMAL HIGH (ref 4.0–10.5)
nRBC: 1.5 % — ABNORMAL HIGH (ref 0.0–0.2)
nRBC: 1.4 % — ABNORMAL HIGH (ref 0.0–0.2)

## 2022-02-18 LAB — TYPE AND SCREEN
ABO/RH(D): A NEG
Antibody Screen: POSITIVE
Donor AG Type: NEGATIVE
Unit division: 0
Unit division: 0

## 2022-02-18 LAB — BPAM RBC
Blood Product Expiration Date: 202310282359
Blood Product Expiration Date: 202311072359
ISSUE DATE / TIME: 202310151432
ISSUE DATE / TIME: 202310151846
Unit Type and Rh: 600
Unit Type and Rh: 600

## 2022-02-18 MED ORDER — POTASSIUM CHLORIDE 20 MEQ PO PACK
40.0000 meq | PACK | Freq: Once | ORAL | Status: AC
  Administered 2022-02-18: 40 meq via ORAL
  Filled 2022-02-18: qty 2

## 2022-02-18 MED ORDER — METOCLOPRAMIDE HCL 5 MG/ML IJ SOLN
5.0000 mg | Freq: Three times a day (TID) | INTRAMUSCULAR | Status: DC | PRN
  Administered 2022-02-21: 5 mg via INTRAVENOUS
  Filled 2022-02-18: qty 2

## 2022-02-18 MED ORDER — POTASSIUM CHLORIDE 10 MEQ/100ML IV SOLN
10.0000 meq | INTRAVENOUS | Status: DC
  Administered 2022-02-18 (×2): 10 meq via INTRAVENOUS
  Filled 2022-02-18 (×3): qty 100

## 2022-02-18 NOTE — H&P (View-Only) (Signed)
Daily Rounding Note  02/18/2022, 2:08 PM  LOS: 5 days   SUBJECTIVE:   Chief complaint:   GIB.  Sp embolization GDA and pancreaticoduodenal arcade  Tolerating clears today.  Asking if she can get grits.  BM today, soft/liquuid and black.   Some nausea,  no emesis.  Spitting out oral secretions.    OBJECTIVE:         Vital signs in last 24 hours:    Temp:  [97.4 F (36.3 C)-98.6 F (37 C)] 97.5 F (36.4 C) (10/16 1132) Pulse Rate:  [60-78] 61 (10/16 1400) Resp:  [16-30] 23 (10/16 1400) BP: (114-152)/(37-110) 127/110 (10/16 1300) SpO2:  [81 %-100 %] 99 % (10/16 1400) Last BM Date : 02/18/22 Filed Weights   02/13/22 1507  Weight: 64.4 kg   General: frail.  Looks acutely/chronically ill.  comfortable   Heart: RRR, paced rhythm. Chest: clear in front.  No resting dyspnea Abdomen: soft, NT, ND.  BS hypoactive.  Extremities: no CCE.  Feet are warm Neuro/Psych:  oriented x 3.  Tired and a bit disengaged.     Intake/Output from previous day: 10/15 0701 - 10/16 0700 In: 1479.5 [P.O.:150; I.V.:476; Blood:803.5; IV Piggyback:50] Out: 950 [Urine:950]  Intake/Output this shift: Total I/O In: 388.1 [P.O.:220; IV Piggyback:168.1] Out: -   Lab Results: Recent Labs    02/17/22 1217 02/18/22 0342 02/18/22 1320  WBC 7.9 8.7 12.6*  HGB 6.7* 8.3* 8.5*  HCT 21.5* 25.1* 25.6*  PLT 156 130* 149*   BMET Recent Labs    02/16/22 0454 02/17/22 0300  NA 135 137  K 3.7 3.5  CL 104 106  CO2 25 24  GLUCOSE 144* 109*  BUN 46* 38*  CREATININE 1.33* 1.40*  CALCIUM 8.0* 8.3*   LFT Recent Labs    02/16/22 0454  PROT 4.9*  ALBUMIN 2.5*  AST 28  ALT 16  ALKPHOS 32*  BILITOT 0.6    Studies/Results: IR Angiogram Visceral Selective IR EMBO ART  VEN HEMORR LYMPH EXTRAV  INC GUIDE ROADMAPPING IR Angiogram Visceral Selective 02/17/2022  Result Date: 02/17/2022 INDICATION: 88 year old female with chronic, worsening  upper gastrointestinal hemorrhage with hypervascular pancreatic head mass visualized on CT earlier today, concerning for neuroendocrine tumor. EXAM: 1. Ultrasound-guided vascular access of the right common femoral artery. 2. Catheterization and angiography of the superior mesenteric and celiac arteries 3. Sub selective catheterization angiography of the inferior pancreaticoduodenal arcade and gastroduodenal arteries 4. Coil embolization of the inferior pancreaticoduodenal arcade and gastroduodenal artery. MEDICATIONS: None. ANESTHESIA/SEDATION: Moderate (conscious) sedation was employed during this procedure. A total of Versed 0 mg and Fentanyl 12.5 mcg was administered intravenously. Moderate Sedation Time: 196 minutes. The patient's level of consciousness and vital signs were monitored continuously by radiology nursing throughout the procedure under my direct supervision. CONTRAST:  13m OMNIPAQUE IOHEXOL 300 MG/ML SOLN, 1039mOMNIPAQUE IOHEXOL 300 MG/ML SOLN, 402mMNIPAQUE IOHEXOL 300 MG/ML SOLN, 58m37mNIPAQUE IOHEXOL 300 MG/ML SOLN FLUOROSCOPY: Radiation Exposure Index (as provided by the fluoroscopic device): [phone] Kerma COMPLICATIONS: None immediate. PROCEDURE: Informed consent was obtained from the patient following explanation of the procedure, risks, benefits and alternatives. The patient understands, agrees and consents for the procedure. All questions were addressed. A time out was performed prior to the initiation of the procedure. Maximal barrier sterile technique utilized including caps, mask, sterile gowns, sterile gloves, large sterile drape, hand hygiene, and Betadine prep. Preprocedure ultrasound evaluation of the right common femoral artery demonstrated patency.  The procedure was planned. Subdermal Local anesthesia was provided at the planned needle entry site. A small skin nick was made with an 11 scalpel. Under direct ultrasound visualization, the right common femoral artery was punctured  with a 21 gauge micropuncture needle. A permanent ultrasound image was captured and stored in the record. A microwire was introduced without difficulty. The needle was exchanged for a micropuncture sheath. Limited right lower extremity angiogram demonstrated adequate puncture site for closure device use. A J wire was directed to the level of the abdominal aorta. The micropuncture sheath was exchanged for a 5 [place], 10 cm vascular sheath. A 5 French, C2 catheter was directed to the level of the superior mesenteric artery, however was unable to engage the ostium. Therefore, the catheter was exchanged over the wire for a Mickelson catheter which was used to select the superior mesenteric artery ostium. Superior mesenteric angiogram was performed which demonstrated patency of the superior mesenteric artery with hypervascular mass in the region of the pancreatic head which measured approximately 4.2 x 4.1 cm in frontal projection diameters. The mass vascular supply appeared to arise from a proximal branch of the superior mesenteric artery compatible with the inferior pancreaticoduodenal arcade. There is no definite evidence of active extravasation into the gastrointestinal lumen. A Progreat Omega microcatheter and fathom 16 microwire were used to attempt to select the inferior pancreaticoduodenal arcade, however this was very challenging due to angulation and position of the base catheter in the SMA. A combination of catheters and wires were attempted which was eventually successful with a 5 [place] RIM catheter in the SMA, a 2.5 [place] cantata microcatheter, and 0.018 inch double angle glide GT wire. Angiogram of the inferior pancreaticoduodenal arcade was performed which demonstrated primary supply to the inferior aspect of the spherical pancreatic head mass. Again, no evidence of definite gastric extravasation into the duodenal lumen was observed. There was early venous outflow via peripheral portal vein branches  concerning for possible arteriovenous shunting. An assortment of Penumbra Ruby microcoils were then used to embolize the distal branches of the inferior pancreticoduodenal arcade, preserving flow to a proximal duodenal branch arising from the parent vessel. Repeat angiogram from proximal in the pancreaticoduodenal parent branch demonstrated complete embolization of the inferior aspect of the mass. The microcatheter was then removed and selection of the celiac trunk was pursued. A 5 [place] Mickelson catheter was engaged in the celiac trunk and celiac angiogram was performed. Angiogram was significant for patent splenic and common hepatic arteries with supply arising from the gastroduodenal artery to the more superior aspect of the spherical mass. Again, no evidence of active gastrointestinal hemorrhage was observed. The gastroduodenal artery was then selected the Progreat microcatheter and fathom wire. Angiogram from the proximal duodenal artery demonstrated tumor blush arising from the superior pancreaticoduodenal branches. Therefore, coil embolization was performed of the distal gastroduodenal artery at the pancreaticoduodenal branch point with an assortment of Penumbra Ruby microcoils. Upon deployment of the final coil, a previously deployed coil became dislodged and traveled into the proximal proper hepatic artery. A micro snare was then used to attempt to capture the coil which was unsuccessful due to the snare being too small. Therefore, a Wholey wire was directed into the distal proper [place] artery. The base catheter and 5 French femoral sheath were removed. A 7 French, 55 cm tori guide sheath was then inserted and directed into the celiac ostium. A 6 [place] Ensnare was then used to capture and removed the displaced coil. Completion celiac angiogram demonstrated  adequate embolization of the distal gastroduodenal artery without significant evidence of persistent enhancement about the superior aspect of the  pancreatic head mass. Vasospasm was visualized in the right hepatic artery. The 7 French sheath was removed and exchanged for an 8 French Angio-Seal device which was deployed successfully to close the right common femoral artery. Distal pulses were unchanged. A sterile bandage was applied. The patient tolerated the procedure well was transferred back to the ICU in stable condition. IMPRESSION: 1. Hyperenhancing spherical mass about the pancreatic head with vascular supply from the superior and inferior pancreaticoduodenal arcades. No evidence of active gastrointestinal hemorrhage. 2. Given that this mass is the presumed etiology of gastrointestinal bleeding, coil embolization of the inferior and superior pancreatico duodenal arteries was performed successfully. Ruthann Cancer, MD [facility] Radiology Electronically Signed   By: Ruthann Cancer M.D.   On: 02/17/2022 09:52   CT Angio Abd/Pel w/ and/or w/o  Result Date: 02/16/2022 CLINICAL DATA:  GI bleed, suspected small bowel bleeding, small bowel AVMs and ulcers * Tracking Code: BO * EXAM: CTA ABDOMEN AND PELVIS WITHOUT AND WITH CONTRAST TECHNIQUE: Multidetector CT imaging of the abdomen and pelvis was performed using the standard protocol during bolus administration of intravenous contrast. Multiplanar reconstructed images and MIPs were obtained and reviewed to evaluate the vascular anatomy. RADIATION DOSE REDUCTION: This exam was performed according to the departmental dose-optimization program which includes automated exposure control, adjustment of the mA and/or kV according to patient size and/or use of iterative reconstruction technique. CONTRAST:  3m OMNIPAQUE IOHEXOL 350 MG/ML SOLN COMPARISON:  CT abdomen pelvis, 02/25/2019 FINDINGS: VASCULAR Normal contour and caliber of the abdominal aorta. No evidence of aneurysm, dissection or other acute aortic pathology. Standard branching pattern of the abdominal  aorta, including a solitary left renal artery. Severe mixed calcific atherosclerosis. Bilateral common iliac artery origins. Review of the MIP images confirms the above findings. NON-VASCULAR Lower chest: Moderate bilateral pleural effusions. Coronary artery calcifications. Hepatobiliary: Subtly enhancing subcapsular lesion of the posterior inferior right lobe of the liver, hepatic segment VI, measuring 1.1 x 1.0 cm (series 8, image 47). This lesion demonstrates greater enhancement on portal venous phase, in contrast to pancreatic lesions. Benign subcapsular cyst of the anterior left lobe of the liver (series 8, image 14). Status post cholecystectomy. No biliary dilatation. Pancreas: New, or at least markedly enlarged, markedly arterially hyperenhancing mass of the inferior pancreatic head and uncinate measuring 4.7 x 3.9 cm (series 6, image 59). This appears to closely involve the adjacent transverse portion of the duodenal with large collaterals about the duodenal wall (series 6, image 64). There are two similarly hyperenhancing nodule of the pancreatic tail, measuring 1.1 x 1.1 cm and 1.0 x 1.0 cm (series 6, image 51). No pancreatic ductal dilatation or surrounding inflammatory changes. Spleen: Normal in size without significant abnormality. Adrenals/Urinary Tract: Adrenal glands are unremarkable. Status post right nephrectomy and adrenalectomy. The left kidney is normal, without renal calculi, solid lesion, or hydronephrosis. Limited visualization of the urinary bladder secondary to dense metallic streak artifact from bilateral hip arthroplasty. Stomach/Bowel: Stomach is within normal limits. Appendix is not clearly visualized. No evidence of bowel wall thickening, distention, or inflammatory changes. Sigmoid diverticulosis. Vascular/Lymphatic: No significant vascular findings are present. No enlarged abdominal or pelvic lymph nodes. Reproductive: Status post hysterectomy. Other: No abdominal wall hernia or  abnormality. Small volume free fluid in the low pelvis (series 6, image 45). Musculoskeletal: Status post bilateral hip total arthroplasty. Unchanged wedge deformity  of L2. IMPRESSION: 1. New, or at least markedly enlarged, arterially hyperenhancing mass of the inferior pancreatic head and uncinate measuring 4.7 x 3.9 cm. This appears to closely involve the adjacent transverse portion of the duodenum with large collaterals about the duodenal wall. 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 Given involvement of the duodenal, this is most likely source of reported GI bleeding. 2. 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 3. Subtly enhancing subcapsular lesion of the posterior inferior right lobe of the liver, hepatic segment VI, measuring 1.1 x 1.0 cm. This lesion demonstrates greater enhancement on portal venous phase, in contrast to pancreatic lesions suggesting an incidental, benign hepatic lesion rather than a metastasis. Attention on follow-up. 4. No other findings within the bowel localize GI bleeding. Sigmoid diverticulosis without evidence of intraluminal contrast in the lower GI tract. 5. Moderate bilateral pleural effusions. 6. Small volume free fluid in the low pelvis. 7. Status post right nephrectomy and adrenalectomy. 8. Coronary artery disease. Electronically Signed   By: Delanna Ahmadi M.D.   On: 02/16/2022 17:04    Scheduled Meds:  sodium chloride   Intravenous Once   Chlorhexidine Gluconate Cloth  6 each Topical Daily   ezetimibe  10 mg Oral Daily   pantoprazole  40 mg Oral BID   rosuvastatin  40 mg Oral Daily   Continuous Infusions:  lactated ringers     PRN Meds:.acetaminophen **OR** acetaminophen, albuterol, guaiFENesin-dextromethorphan, lactated ringers, metoCLOPramide (REGLAN) injection, ondansetron (ZOFRAN) IV, mouth rinse  ASSESMENT:   GI  bleed.  Melena: acute, recurrent.   11/2021 VCE showed likely distal ileal ulcer.   02/15/2022 SBE: Esophagus, stomach normal.  Hiatal hernia.  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  Examined jejunum normal. 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  No active bleeding on angiography but Dr. Serafina Royals elected to proceed with coil embolization of GDA and pancreaticoduodenal arcade. Protonix 40 mg po bid in place.    Blood loss anemia.  IV iron on 10/14.  3 PRBCs so far, last on 10/15.  Hgb 8.9.Marland Kitchen. 6.7.. 8.5.    A-fib.  On chronic Eliquis.  Currently on hold.  Covid 19 dx 10/11.  Off precautions.     PLAN     Management of the pancreatic lesion going forward?, EUS?  May need Dr Mansouraty's input here.      Azucena Freed  02/18/2022, 2:08 PM Phone [phone]    Attending Physician Note   I have taken an interval history, reviewed the chart and examined the patient. I performed a substantive portion of this encounter, including complete performance of at least one of the key components, in conjunction with the APP. I agree with the APP's note, impression and recommendations with my edits. My additional impressions and recommendations are as follows.   GI bleed from pancreatic mass, duodenal AVMs. Reviewed with Dr. Rush Landmark - given recent bleeding, IR intervention he recommends outpatient EUS in a couple weeks to further evaluate. Outpatient EUS scheduled on 10/30. Would wait at least 3 days before resuming Eliquis from GI standpoint however IR may have addition input on resuming Eliquis. No additional GI recommendations a this time. Outpatient office GI follow-up with Dr. Bryan Lemma. GI signing off.   Lucio Edward, MD [facility] See [facility], [facility], for our on call provider

## 2022-02-18 NOTE — Progress Notes (Signed)
Patient transferred to 6N04 via bed . I have informed her daughter that she is moving

## 2022-02-18 NOTE — Progress Notes (Addendum)
Daily Rounding Note  02/18/2022, 2:08 PM  LOS: 5 days   SUBJECTIVE:   Chief complaint:   GIB.  Sp embolization GDA and pancreaticoduodenal arcade  Tolerating clears today.  Asking if she can get grits.  BM today, soft/liquuid and black.   Some nausea,  no emesis.  Spitting out oral secretions.    OBJECTIVE:         Vital signs in last 24 hours:    Temp:  [97.4 F (36.3 C)-98.6 F (37 C)] 97.5 F (36.4 C) (10/16 1132) Pulse Rate:  [60-78] 61 (10/16 1400) Resp:  [16-30] 23 (10/16 1400) BP: (114-152)/(37-110) 127/110 (10/16 1300) SpO2:  [81 %-100 %] 99 % (10/16 1400) Last BM Date : 02/18/22 Filed Weights   02/13/22 1507  Weight: 64.4 kg   General: frail.  Looks acutely/chronically ill.  comfortable   Heart: RRR, paced rhythm. Chest: clear in front.  No resting dyspnea Abdomen: soft, NT, ND.  BS hypoactive.  Extremities: no CCE.  Feet are warm Neuro/Psych:  oriented x 3.  Tired and a bit disengaged.     Intake/Output from previous day: 10/15 0701 - 10/16 0700 In: 1479.5 [P.O.:150; I.V.:476; Blood:803.5; IV Piggyback:50] Out: 950 [Urine:950]  Intake/Output this shift: Total I/O In: 388.1 [P.O.:220; IV Piggyback:168.1] Out: -   Lab Results: Recent Labs    02/17/22 1217 02/18/22 0342 02/18/22 1320  WBC 7.9 8.7 12.6*  HGB 6.7* 8.3* 8.5*  HCT 21.5* 25.1* 25.6*  PLT 156 130* 149*   BMET Recent Labs    02/16/22 0454 02/17/22 0300  NA 135 137  K 3.7 3.5  CL 104 106  CO2 25 24  GLUCOSE 144* 109*  BUN 46* 38*  CREATININE 1.33* 1.40*  CALCIUM 8.0* 8.3*   LFT Recent Labs    02/16/22 0454  PROT 4.9*  ALBUMIN 2.5*  AST 28  ALT 16  ALKPHOS 32*  BILITOT 0.6    Studies/Results: IR Angiogram Visceral Selective IR EMBO ART  VEN HEMORR LYMPH EXTRAV  INC GUIDE ROADMAPPING IR Angiogram Visceral Selective 02/17/2022  Result Date: 02/17/2022 INDICATION: 88 year old female with chronic, worsening  upper gastrointestinal hemorrhage with hypervascular pancreatic head mass visualized on CT earlier today, concerning for neuroendocrine tumor. EXAM: 1. Ultrasound-guided vascular access of the right common femoral artery. 2. Catheterization and angiography of the superior mesenteric and celiac arteries 3. Sub selective catheterization angiography of the inferior pancreaticoduodenal arcade and gastroduodenal arteries 4. Coil embolization of the inferior pancreaticoduodenal arcade and gastroduodenal artery. MEDICATIONS: None. ANESTHESIA/SEDATION: Moderate (conscious) sedation was employed during this procedure. A total of Versed 0 mg and Fentanyl 12.5 mcg was administered intravenously. Moderate Sedation Time: 196 minutes. The patient's level of consciousness and vital signs were monitored continuously by radiology nursing throughout the procedure under my direct supervision. CONTRAST:  65m OMNIPAQUE IOHEXOL 300 MG/ML SOLN, 1047mOMNIPAQUE IOHEXOL 300 MG/ML SOLN, 4041mMNIPAQUE IOHEXOL 300 MG/ML SOLN, 35m53mNIPAQUE IOHEXOL 300 MG/ML SOLN FLUOROSCOPY: Radiation Exposure Index (as provided by the fluoroscopic device): [id number] Kerma COMPLICATIONS: None immediate. PROCEDURE: Informed consent was obtained from the patient following explanation of the procedure, risks, benefits and alternatives. The patient understands, agrees and consents for the procedure. All questions were addressed. A time out was performed prior to the initiation of the procedure. Maximal barrier sterile technique utilized including caps, mask, sterile gowns, sterile gloves, large sterile drape, hand hygiene, and Betadine prep. Preprocedure ultrasound evaluation of the right common femoral artery demonstrated patency.  The procedure was planned. Subdermal Local anesthesia was provided at the planned needle entry site. A small skin nick was made with an 11 scalpel. Under direct ultrasound visualization, the right common femoral artery was punctured  with a 21 gauge micropuncture needle. A permanent ultrasound image was captured and stored in the record. A microwire was introduced without difficulty. The needle was exchanged for a micropuncture sheath. Limited right lower extremity angiogram demonstrated adequate puncture site for closure device use. A J wire was directed to the level of the abdominal aorta. The micropuncture sheath was exchanged for a 5 [place], 10 cm vascular sheath. A 5 French, C2 catheter was directed to the level of the superior mesenteric artery, however was unable to engage the ostium. Therefore, the catheter was exchanged over the wire for a Mickelson catheter which was used to select the superior mesenteric artery ostium. Superior mesenteric angiogram was performed which demonstrated patency of the superior mesenteric artery with hypervascular mass in the region of the pancreatic head which measured approximately 4.2 x 4.1 cm in frontal projection diameters. The mass vascular supply appeared to arise from a proximal branch of the superior mesenteric artery compatible with the inferior pancreaticoduodenal arcade. There is no definite evidence of active extravasation into the gastrointestinal lumen. A Progreat Omega microcatheter and fathom 16 microwire were used to attempt to select the inferior pancreaticoduodenal arcade, however this was very challenging due to angulation and position of the base catheter in the SMA. A combination of catheters and wires were attempted which was eventually successful with a 5 [place] RIM catheter in the SMA, a 2.5 [place] cantata microcatheter, and 0.018 inch double angle glide GT wire. Angiogram of the inferior pancreaticoduodenal arcade was performed which demonstrated primary supply to the inferior aspect of the spherical pancreatic head mass. Again, no evidence of definite gastric extravasation into the duodenal lumen was observed. There was early venous outflow via peripheral portal vein branches  concerning for possible arteriovenous shunting. An assortment of Penumbra Ruby microcoils were then used to embolize the distal branches of the inferior pancreticoduodenal arcade, preserving flow to a proximal duodenal branch arising from the parent vessel. Repeat angiogram from proximal in the pancreaticoduodenal parent branch demonstrated complete embolization of the inferior aspect of the mass. The microcatheter was then removed and selection of the celiac trunk was pursued. A 5 [place] Mickelson catheter was engaged in the celiac trunk and celiac angiogram was performed. Angiogram was significant for patent splenic and common hepatic arteries with supply arising from the gastroduodenal artery to the more superior aspect of the spherical mass. Again, no evidence of active gastrointestinal hemorrhage was observed. The gastroduodenal artery was then selected the Progreat microcatheter and fathom wire. Angiogram from the proximal duodenal artery demonstrated tumor blush arising from the superior pancreaticoduodenal branches. Therefore, coil embolization was performed of the distal gastroduodenal artery at the pancreaticoduodenal branch point with an assortment of Penumbra Ruby microcoils. Upon deployment of the final coil, a previously deployed coil became dislodged and traveled into the proximal proper hepatic artery. A micro snare was then used to attempt to capture the coil which was unsuccessful due to the snare being too small. Therefore, a Wholey wire was directed into the distal proper [place] artery. The base catheter and 5 French femoral sheath were removed. A 7 French, 55 cm tori guide sheath was then inserted and directed into the celiac ostium. A 6 [place] Ensnare was then used to capture and removed the displaced coil. Completion celiac angiogram demonstrated  adequate embolization of the distal gastroduodenal artery without significant evidence of persistent enhancement about the superior aspect of the  pancreatic head mass. Vasospasm was visualized in the right hepatic artery. The 7 French sheath was removed and exchanged for an 8 French Angio-Seal device which was deployed successfully to close the right common femoral artery. Distal pulses were unchanged. A sterile bandage was applied. The patient tolerated the procedure well was transferred back to the ICU in stable condition. IMPRESSION: 1. Hyperenhancing spherical mass about the pancreatic head with vascular supply from the superior and inferior pancreaticoduodenal arcades. No evidence of active gastrointestinal hemorrhage. 2. Given that this mass is the presumed etiology of gastrointestinal bleeding, coil embolization of the inferior and superior pancreatico duodenal arteries was performed successfully. Ruthann Cancer, MD [facility] Radiology Electronically Signed   By: Ruthann Cancer M.D.   On: 02/17/2022 09:52   CT Angio Abd/Pel w/ and/or w/o  Result Date: 02/16/2022 CLINICAL DATA:  GI bleed, suspected small bowel bleeding, small bowel AVMs and ulcers * Tracking Code: BO * EXAM: CTA ABDOMEN AND PELVIS WITHOUT AND WITH CONTRAST TECHNIQUE: Multidetector CT imaging of the abdomen and pelvis was performed using the standard protocol during bolus administration of intravenous contrast. Multiplanar reconstructed images and MIPs were obtained and reviewed to evaluate the vascular anatomy. RADIATION DOSE REDUCTION: This exam was performed according to the departmental dose-optimization program which includes automated exposure control, adjustment of the mA and/or kV according to patient size and/or use of iterative reconstruction technique. CONTRAST:  75m OMNIPAQUE IOHEXOL 350 MG/ML SOLN COMPARISON:  CT abdomen pelvis, 02/25/2019 FINDINGS: VASCULAR Normal contour and caliber of the abdominal aorta. No evidence of aneurysm, dissection or other acute aortic pathology. Standard branching pattern of the abdominal  aorta, including a solitary left renal artery. Severe mixed calcific atherosclerosis. Bilateral common iliac artery origins. Review of the MIP images confirms the above findings. NON-VASCULAR Lower chest: Moderate bilateral pleural effusions. Coronary artery calcifications. Hepatobiliary: Subtly enhancing subcapsular lesion of the posterior inferior right lobe of the liver, hepatic segment VI, measuring 1.1 x 1.0 cm (series 8, image 47). This lesion demonstrates greater enhancement on portal venous phase, in contrast to pancreatic lesions. Benign subcapsular cyst of the anterior left lobe of the liver (series 8, image 14). Status post cholecystectomy. No biliary dilatation. Pancreas: New, or at least markedly enlarged, markedly arterially hyperenhancing mass of the inferior pancreatic head and uncinate measuring 4.7 x 3.9 cm (series 6, image 59). This appears to closely involve the adjacent transverse portion of the duodenal with large collaterals about the duodenal wall (series 6, image 64). There are two similarly hyperenhancing nodule of the pancreatic tail, measuring 1.1 x 1.1 cm and 1.0 x 1.0 cm (series 6, image 51). No pancreatic ductal dilatation or surrounding inflammatory changes. Spleen: Normal in size without significant abnormality. Adrenals/Urinary Tract: Adrenal glands are unremarkable. Status post right nephrectomy and adrenalectomy. The left kidney is normal, without renal calculi, solid lesion, or hydronephrosis. Limited visualization of the urinary bladder secondary to dense metallic streak artifact from bilateral hip arthroplasty. Stomach/Bowel: Stomach is within normal limits. Appendix is not clearly visualized. No evidence of bowel wall thickening, distention, or inflammatory changes. Sigmoid diverticulosis. Vascular/Lymphatic: No significant vascular findings are present. No enlarged abdominal or pelvic lymph nodes. Reproductive: Status post hysterectomy. Other: No abdominal wall hernia or  abnormality. Small volume free fluid in the low pelvis (series 6, image 45). Musculoskeletal: Status post bilateral hip total arthroplasty. Unchanged wedge deformity  of L2. IMPRESSION: 1. New, or at least markedly enlarged, arterially hyperenhancing mass of the inferior pancreatic head and uncinate measuring 4.7 x 3.9 cm. This appears to closely involve the adjacent transverse portion of the duodenum with large collaterals about the duodenal wall. 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 Given involvement of the duodenal, this is most likely source of reported GI bleeding. 2. 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 3. Subtly enhancing subcapsular lesion of the posterior inferior right lobe of the liver, hepatic segment VI, measuring 1.1 x 1.0 cm. This lesion demonstrates greater enhancement on portal venous phase, in contrast to pancreatic lesions suggesting an incidental, benign hepatic lesion rather than a metastasis. Attention on follow-up. 4. No other findings within the bowel localize GI bleeding. Sigmoid diverticulosis without evidence of intraluminal contrast in the lower GI tract. 5. Moderate bilateral pleural effusions. 6. Small volume free fluid in the low pelvis. 7. Status post right nephrectomy and adrenalectomy. 8. Coronary artery disease. Electronically Signed   By: Delanna Ahmadi M.D.   On: 02/16/2022 17:04    Scheduled Meds:  sodium chloride   Intravenous Once   Chlorhexidine Gluconate Cloth  6 each Topical Daily   ezetimibe  10 mg Oral Daily   pantoprazole  40 mg Oral BID   rosuvastatin  40 mg Oral Daily   Continuous Infusions:  lactated ringers     PRN Meds:.acetaminophen **OR** acetaminophen, albuterol, guaiFENesin-dextromethorphan, lactated ringers, metoCLOPramide (REGLAN) injection, ondansetron (ZOFRAN) IV, mouth rinse  ASSESMENT:   GI  bleed.  Melena: acute, recurrent.   11/2021 VCE showed likely distal ileal ulcer.   02/15/2022 SBE: Esophagus, stomach normal.  Hiatal hernia.  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  Examined jejunum normal. 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  No active bleeding on angiography but Dr. Serafina Royals elected to proceed with coil embolization of GDA and pancreaticoduodenal arcade. Protonix 40 mg po bid in place.    Blood loss anemia.  IV iron on 10/14.  3 PRBCs so far, last on 10/15.  Hgb 8.9.Marland Kitchen. 6.7.. 8.5.    A-fib.  On chronic Eliquis.  Currently on hold.  Covid 19 dx 10/11.  Off precautions.     PLAN     Management of the pancreatic lesion going forward?, EUS?  May need Dr Mansouraty's input here.      Azucena Freed  02/18/2022, 2:08 PM Phone [phone]    Attending Physician Note   I have taken an interval history, reviewed the chart and examined the patient. I performed a substantive portion of this encounter, including complete performance of at least one of the key components, in conjunction with the APP. I agree with the APP's note, impression and recommendations with my edits. My additional impressions and recommendations are as follows.   GI bleed from pancreatic mass, duodenal AVMs. Reviewed with Dr. Rush Landmark - given recent bleeding, IR intervention he recommends outpatient EUS in a couple weeks to further evaluate. Outpatient EUS scheduled on 10/30. Would wait at least 3 days before resuming Eliquis from GI standpoint however IR may have addition input on resuming Eliquis. No additional GI recommendations a this time. Outpatient office GI follow-up with Dr. Bryan Lemma. GI signing off.   Lucio Edward, MD [facility] See AM[facility], [facility], for our on call provider

## 2022-02-18 NOTE — Progress Notes (Signed)
NAME:  [name], MRN:  [id number], DOB:  [birth year], LOS: 5 ADMISSION DATE:  02/13/2022, CONSULTATION DATE:  02/14/2022 REFERRING MD:  Dr. Cruzita Lederer, TRH, CHIEF COMPLAINT:  Melena    History of Present Illness:  88 yo female brought to ER due to low Hb and 3 days of melena.  She has hx of AVMs.  She is on eliquis for a fib.  She was diagnosed with COVID 6 days prior to admission.  GI consulted.  On 10/12 she developed rigors with hypoxia and concern for transfusion reaction.  Transfusion stopped and given solumedrol.  Developed hypotension and fever.  Antibiotics started.  She complained of chest pain and troponin elevated.   PCCM initially consulted 10/12 for melena, at which time she did not require ICU transfer PCCM reconsulted 10/14 for melena with need for blood transfusion. In light of hx transfusion reactions, pt to be transferred to ICU   Pertinent  Medical History  A fib, GI bleed with AVMs, HTN, Hypothyroidism, Anxiety, OA, CKD, CAD, HLD, Renal cell cancer s/p Rt nephrectomy, Pulmonary hypertension, Aortic stenosis, COPD, non ischemic CM, s/p ICD, Celiac and SMA stenoses  Significant Hospital Events: Including procedures, antibiotic start and stop dates in addition to other pertinent events   10/11 Admit, GI consulted.  10/12 possible transfusion reaction 10/13 gastritis, 2 nonbleeding AVMs s/p cautery on enteroscopy  10/14 hgb cont to drop, down to 7.1  10/15 two more units of blood transfused   Interim History / Subjective:  Patient is resting comfortable on my exam. Patient currently is reporting fatigue and weakness, but patient is not complaining of any shortness of breath or chest pain at this tine. Patient reports that he transfusion went great and she  had no concerns with her transfusion yesterday. Patient reports that she does not have a good appetite at this time.   Objective   Blood pressure (!) 144/75, pulse 61, temperature 97.6 F (36.4 C), temperature  source Oral, resp. rate (!) 25, height '5\' 6"'$  (1.676 m), weight 64.4 kg, SpO2 100 %.        Intake/Output Summary (Last 24 hours) at 02/18/2022 1123 Last data filed at 02/18/2022 1100 Gross per 24 hour  Intake 1599.99 ml  Output 600 ml  Net 999.99 ml   Filed Weights   02/13/22 1507  Weight: 64.4 kg    Examination:  General: Patient is resting comfortably in bed in no acute distress  Eyes: Pupils equal and reactive to light, EOM intact  Head: Normocephalic, atraumatic  Neck: Supple, nontender, full range of motion, No JVD Cardio: Grade III/VI systolic murmur heard to left 2nd intercostal space  Chest: No chest tenderness Pulmonary: Clear to ausculation bilaterally with no rales, rhonchi, and crackles  Abdomen: Soft, nontender with normoactive bowel sounds  Neuro: Alert and orientated x3.  Resolved Hospital Problem list     Assessment & Plan:  This is a 88 year old female with a new pancreatic mass found on CT who presented with acute upper GI bleed. Patient admitted to the ICU for further management and work up for acute upper GI bleed.   #Acute upper/lower GI bleed #Acute Blood loss anemia  #Pancreatic Mass (NET) #Transfusion reaction  Of note, there are potential 2 sites where the patient can be Patient was admitted to the ICU for monitoring as patient had a transfusion with blood transfusion earlier in the admission. Patient is status post 2 units of blood yesterday with post transfusion hemoglobin 8.3. Patient is  status post gastroduodenal artery embolization and pancreaticoduodenal artery embolization 2 days ago. Patient has not had any bowel movements since yesterday. Patient reports that she is tired and weak. No longer nauseated. Patient on exam has pale conjunctiva.  -GI following, appreciate recs -IR following, appreciate recs -Trend H/H -Protonix 40 mg BID  -Transfer out of ICU today   #Pneumonia, resolved  Finished zosyn. No concerns currently.  -Continue to  monitor respiratory status -Start weaning oxygen as tolerated with oxygen goal >92% -Robitussin PRN -Albuterol PRN  #CAD #AFIB history  #HLD  Patient with a paced rhythm. Patient is rate controlled. Eliquis being held. -Hold eliquis -Continue Crestor 40 mg daily and Zetia 10 mg daily   #CKD 3b  Stable -Continue to monitor   Best Practice (right click and "Reselect all SmartList Selections" daily)   Diet/type: Clear liquid  DVT prophylaxis: SCD GI prophylaxis: PPI Lines: N/A Foley:  N/A Code Status:  full code  Labs   CBC: Recent Labs  Lab 02/12/22 1655 02/13/22 1625 02/13/22 2106 02/15/22 1848 02/16/22 0454 02/16/22 1213 02/17/22 0300 02/17/22 1217 02/18/22 0342  WBC 2.6* 3.0*   < > 5.5 6.9  --  8.8 7.9 8.7  NEUTROABS 1,862 2.3  --   --   --   --   --   --   --   HGB 8.9* 7.8*   < > 7.6* 7.1* 7.0* 6.8* 6.7* 8.3*  HCT 26.7* 23.8*   < > 24.0* 21.6* 22.2* 20.8* 21.5* 25.1*  MCV 84.0 88.1   < > 89.6 87.1  --  88.1 91.1 88.1  PLT 144 142*   < > 161 164  --  195 156 130*   < > = values in this interval not displayed.    Basic Metabolic Panel: Recent Labs  Lab 02/13/22 1625 02/14/22 0245 02/14/22 1241 02/15/22 0130 02/16/22 0454 02/17/22 0300  NA 136  --  138 137 135 137  K 3.4*  --  3.4* 3.8 3.7 3.5  CL 100  --  105 103 104 106  CO2 27  --  '23 26 25 24  '$ GLUCOSE 102*  --  89 153* 144* 109*  BUN 68*  --  49* 43* 46* 38*  CREATININE 1.32*  --  1.42* 1.40* 1.33* 1.40*  CALCIUM 8.5*  --  7.9* 8.0* 8.0* 8.3*  MG  --  2.1  --  1.9 2.0 2.2  PHOS  --  2.5  --   --   --   --    GFR: Estimated Creatinine Clearance: 27.5 mL/min (A) (by C-G formula based on SCr of 1.4 mg/dL (H)). Recent Labs  Lab 02/13/22 1505 02/13/22 1625 02/16/22 0454 02/17/22 0300 02/17/22 1217 02/18/22 0342  WBC  --    < > 6.9 8.8 7.9 8.7  LATICACIDVEN 1.2  --   --   --   --   --    < > = values in this interval not displayed.    Liver Function Tests: Recent Labs  Lab  02/12/22 1655 02/13/22 1625 02/14/22 1241 02/15/22 0130 02/16/22 0454  AST '22 21 25 '$ 33 28  ALT '11 14 12 14 16  '$ ALKPHOS  --  36* 35* 42 32*  BILITOT 0.4 0.6 1.8* 1.1 0.6  PROT 6.3 5.5* 5.1* 5.1* 4.9*  ALBUMIN  --  3.0* 2.6* 2.6* 2.5*   No results for input(s): "LIPASE", "AMYLASE" in the last 168 hours. No results for input(s): "AMMONIA" in the last 168  hours.  ABG    Component Value Date/Time   PHART 7.469 (H) 11/06/2018 0355   PCO2ART 46.5 11/06/2018 0355   PO2ART 68.3 (L) 11/06/2018 0355   HCO3 31.6 (H) 03/08/2019 0933   TCO2 33 (H) 03/08/2019 0933   O2SAT 59.0 03/08/2019 0933     Coagulation Profile: Recent Labs  Lab 02/13/22 1625  INR 1.2    Cardiac Enzymes: No results for input(s): "CKTOTAL", "CKMB", "CKMBINDEX", "TROPONINI" in the last 168 hours.  HbA1C: Hgb A1c MFr Bld  Date/Time Value Ref Range Status  02/09/2021 02:07 AM 4.6 (L) 4.8 - 5.6 % Final    Comment:    (NOTE) Pre diabetes:          5.7%-6.4%  Diabetes:              >6.4%  Glycemic control for   <7.0% adults with diabetes   08/17/2014 05:00 AM 6.1 (H) 4.8 - 5.6 % Final    Comment:    (NOTE)         Pre-diabetes: 5.7 - 6.4         Diabetes: >6.4         Glycemic control for adults with diabetes: <7.0     CBG: Recent Labs  Lab 02/16/22 2250  GLUCAP 97    Review of Systems:   Negative except what is stated in the HPI.   Past Medical History:  She,  has a past medical history of Actinic keratosis (01/17/2015), Adjustment disorder with anxiety, Anemia, Anginal pain ([place]), Arthritis, Atrial fibrillation ([place]), Atypical mole (03/25/2018), Automatic implantable cardioverter-defibrillator in situ, Avascular necrosis of hip ([place]) (05/03/2011), Blood in stool, Carotid artery stenosis (09/2007), Chest pain, unspecified, Chronic airway obstruction, not elsewhere classified, Chronic bronchitis ([place]), Chronic diastolic CHF (congestive heart failure) ([place]) (06/04/2013), Chronic kidney disease,  unspecified, Coronary artery disease, Displaced fracture of left femoral neck ([place]) (10/09/2018), Frequent UTI, GI bleed (03/28/2020), Hemorrhage of rectum and anus, High cholesterol, History of blood transfusion (04/2011), renal cell cancer, Hypertension (05/20/2011), Hypertr obst cardiomyop, Hypotension, unspecified, Long term (current) use of anticoagulants, Malignant neoplasm of kidney, except pelvis, Osteoarthritis of right hip, Other and unspecified coagulation defects, PONV (postoperative nausea and vomiting), Presence of permanent cardiac pacemaker, RECTAL BLEEDING (10/13/2009), Renal cancer ([place]) (06/2001), Secondary cardiomyopathy, unspecified, Sinus bradycardia, Squamous cell carcinoma of skin (01/17/2015), Squamous cell carcinoma of skin (01/29/2018), Squamous cell carcinoma of skin (03/25/2018), Squamous cell carcinoma of skin (11/09/2020), Squamous cell carcinoma of skin (12/19/2021), Squamous cell carcinoma of skin (12/19/2021), Squamous cell carcinoma of skin (01/21/2022), Squamous cell carcinoma of skin (01/21/2022), Squamous cell carcinoma of skin (01/21/2022), and Urge incontinence.   Surgical History:   Past Surgical History:  Procedure Laterality Date   ABDOMINAL AORTOGRAM W/LOWER EXTREMITY N/A 02/12/2021   Procedure: ABDOMINAL AORTOGRAM W/LOWER EXTREMITY;  Surgeon: Waynetta Sandy, MD;  Location: [place] [facility];  Service: Cardiovascular;  Laterality: N/A;   ABDOMINAL HYSTERECTOMY  1975   for benign causes   APPENDECTOMY     BI-VENTRICULAR PACEMAKER UPGRADE  05/04/2010   BIOPSY  02/28/2019   Procedure: BIOPSY;  Surgeon: Milus Banister, MD;  [facility];  Service: Endoscopy;;   CARDIAC CATHETERIZATION  2006   CARDIOVERSION N/A 02/20/2018   Procedure: CARDIOVERSION;  Surgeon: Minna Merritts, MD;  Location: [place] [facility];  Service: Cardiovascular;  Laterality: N/A;   CARDIOVERSION N/A 03/27/2018   Procedure: CARDIOVERSION;  Surgeon: Minna Merritts, MD;   Location: [facility];  Service: Cardiovascular;  Laterality: N/A;  CATARACT EXTRACTION W/ INTRAOCULAR LENS  IMPLANT, BILATERAL  01/2006-02-2006   CHOLECYSTECTOMY N/A 11/11/2012   Procedure: LAPAROSCOPIC CHOLECYSTECTOMY WITH INTRAOPERATIVE CHOLANGIOGRAM;  Surgeon: Imogene Burn. Georgette Dover, MD;  Location: [place];  Service: General;  Laterality: N/A;   COLONOSCOPY WITH PROPOFOL N/A 02/28/2019   Procedure: COLONOSCOPY WITH PROPOFOL;  Surgeon: Milus Banister, MD;  Location: [facility] ENDOSCOPY;  Service: Endoscopy;  Laterality: N/A;   ENTEROSCOPY N/A 03/30/2020   Procedure: ENTEROSCOPY;  Surgeon: Lavena Bullion, DO;  Location: [place];  Service: Gastroenterology;  Laterality: N/A;   ENTEROSCOPY N/A 02/10/2021   Procedure: ENTEROSCOPY;  Surgeon: Carol Ada, MD;  Location: [place];  Service: Endoscopy;  Laterality: N/A;   EP IMPLANTABLE DEVICE N/A 02/21/2016   Procedure: ICD Generator Changeout;  Surgeon: Deboraha Sprang, MD;  Location: [place] [facility];  Service: Cardiovascular;  Laterality: N/A;   ESOPHAGOGASTRODUODENOSCOPY (EGD) WITH PROPOFOL N/A 02/28/2019   Procedure: ESOPHAGOGASTRODUODENOSCOPY (EGD) WITH PROPOFOL;  Surgeon: Milus Banister, MD;  Location: [facility] ENDOSCOPY;  Service: Endoscopy;  Laterality: N/A;   GIVENS CAPSULE STUDY N/A 03/15/2019   Procedure: GIVENS CAPSULE STUDY;  Surgeon: Thornton Park, MD;  Location: [place];  Service: Gastroenterology;  Laterality: N/A;   HOT HEMOSTASIS N/A 03/30/2020   Procedure: HOT HEMOSTASIS (ARGON PLASMA COAGULATION/BICAP);  Surgeon: Lavena Bullion, DO;  Location: [facility] ENDOSCOPY;  Service: Gastroenterology;  Laterality: N/A;   HOT HEMOSTASIS N/A 02/10/2021   Procedure: HOT HEMOSTASIS (ARGON PLASMA COAGULATION/BICAP);  Surgeon: Carol Ada, MD;  Location: [place];  Service: Endoscopy;  Laterality: N/A;   INSERT / REPLACE / REMOVE PACEMAKER  05-01-11   02-28-05-/05-04-10-ICD-MEDTRONIC MAXIMAL DR   IR ANGIOGRAM SELECTIVE EACH ADDITIONAL  VESSEL  02/18/2022   IR ANGIOGRAM SELECTIVE EACH ADDITIONAL VESSEL  02/18/2022   IR ANGIOGRAM VISCERAL SELECTIVE  02/16/2022   IR ANGIOGRAM VISCERAL SELECTIVE  02/16/2022   IR EMBO ART  VEN HEMORR LYMPH EXTRAV  INC GUIDE ROADMAPPING  02/16/2022   IR US GUIDE VASC ACCESS RIGHT  02/16/2022   JOINT REPLACEMENT     LAPAROSCOPIC CHOLECYSTECTOMY  11/11/2012   LAPAROSCOPIC LYSIS OF ADHESIONS N/A 11/11/2012   Procedure: LAPAROSCOPIC LYSIS OF ADHESIONS;  Surgeon: Imogene Burn. Georgette Dover, MD;  Location: [place];  Service: General;  Laterality: N/A;   NEPHRECTOMY Right 06/2001    S/P RENAL CELL CANCER   PERIPHERAL VASCULAR INTERVENTION Bilateral 02/12/2021   Procedure: PERIPHERAL VASCULAR INTERVENTION;  Surgeon: Waynetta Sandy, MD;  Location: [place] [facility];  Service: Cardiovascular;  Laterality: Bilateral;  Iliac artery stents   PRESSURE SENSOR/CARDIOMEMS N/A 02/03/2019   Procedure: PRESSURE SENSOR/CARDIOMEMS;  Surgeon: Larey Dresser, MD;  Location: [place] [facility];  Service: Cardiovascular;  Laterality: N/A;   RIGHT HEART CATH N/A 11/09/2018   Procedure: RIGHT HEART CATH;  Surgeon: Larey Dresser, MD;  Location: [place] [facility];  Service: Cardiovascular;  Laterality: N/A;   RIGHT HEART CATH N/A 03/08/2019   Procedure: RIGHT HEART CATH;  Surgeon: Larey Dresser, MD;  Location: [place] [facility];  Service: Cardiovascular;  Laterality: N/A;   SUBMUCOSAL TATTOO INJECTION  02/28/2019   Procedure: SUBMUCOSAL TATTOO INJECTION;  Surgeon: Milus Banister, MD;  Location: [facility] ENDOSCOPY;  Service: Endoscopy;;   TOTAL HIP ARTHROPLASTY Right 05/03/2011   Procedure: TOTAL HIP ARTHROPLASTY ANTERIOR APPROACH;  Surgeon: Mcarthur Rossetti;  Location: [facility];  Service: Orthopedics;  Laterality: Right;  Removal of Cannulated Screws Right Hip, Right Direct Anterior Hip Replacement   TOTAL HIP ARTHROPLASTY Left 10/09/2018  Procedure: TOTAL HIP ARTHROPLASTY ANTERIOR APPROACH;  Surgeon: Rod Can, MD;   Location: [place];  Service: Orthopedics;  Laterality: Left;     Social History:   reports that she quit smoking about 20 years ago. Her smoking use included cigarettes. She has a 20.00 pack-year smoking history. She has never used smokeless tobacco. She reports that she does not drink alcohol and does not use drugs.   Family History:  Her family history includes Breast cancer in her cousin and another family member; Breast cancer (age of onset: 82) in her maternal aunt; Early death in her father; Heart failure in her mother. There is no history of Colon cancer, Esophageal cancer, Liver disease, Pancreatic cancer, or Rectal cancer.   Allergies Allergies  Allergen Reactions   Dilaudid [Hydromorphone] Other (See Comments)    Excessive Somnolence- Required Narcan   Fentanyl Other (See Comments)    Required Narcan when given with Dilaudid. Tolerated single dose when given during procedure 02/03/2019.    Codeine Nausea And Vomiting    Hallucinations    Morphine And Related Nausea And Vomiting    Hallucinations    Hydrocodone Nausea Only and Other (See Comments)    Nausea and lethargy   Oxycodone Nausea Only and Other (See Comments)    Nausea and lethargy   Tramadol Nausea Only and Other (See Comments)    Nausea and lethargy   Whole Blood Itching and Nausea And Vomiting    Pt and family stated pt will receive blood products but had a reaction requiring benadryl on a previous administration.    Sulfonamide Derivatives Other (See Comments)    Dry mouth     Home Medications  Prior to Admission medications   Medication Sig Start Date End Date Taking? Authorizing Provider  acetaminophen (TYLENOL) 500 MG tablet Take 500-1,000 mg by mouth every 8 (eight) hours as needed for mild pain or headache.   Yes [provider]  albuterol (VENTOLIN HFA) 108 (90 Base) MCG/ACT inhaler Inhale 2 puffs into the lungs every 6 (six) hours as needed for wheezing or shortness of breath. 02/08/22  Yes Waunita Schooner, MD  Cyanocobalamin (B-12 PO) Take 1 tablet by mouth daily.    Yes [provider]  ELIQUIS 2.5 MG TABS tablet TAKE 1 TABLET BY MOUTH TWICE A DAY Patient taking differently: Take 2.5 mg by mouth 2 (two) times daily. 06/11/21  Yes Larey Dresser, MD  fluticasone [facility]) 50 MCG/ACT nasal spray Place 2 sprays into both nostrils daily as needed for allergies or rhinitis. 03/08/21  Yes Waunita Schooner, MD  metolazone (ZAROXOLYN) 2.5 MG tablet Take 1 tablet (2.5 mg total) by mouth as directed. Take every other Thursday with your morning dose of Torsemide Patient taking differently: Take 2.5 mg by mouth once a week. Take Thursday with your morning dose of Torsemide 08/08/21  Yes [place], [place], FNP  potassium chloride SA (KLOR-CON M) 20 MEQ tablet TAKE 2 TABLETS (40 MEQ) BY MOUTH 2 TIMESDAILY. TAKE 1 EXTRA TABLET ON THE DAYS YOU TAKE METOLAZONE. Patient taking differently: Take 40-60 mEq by mouth 2 (two) times daily. Take 1 extra tablet (20 mEq) on the days you take metolazone. 10/04/21  Yes Larey Dresser, MD  vitamin C (ASCORBIC ACID) 250 MG tablet Take 500 mg by mouth daily.   Yes [provider]  VITAMIN D PO Take 4,000 Units by mouth daily.   Yes [provider]  clobetasol cream (TEMOVATE) 0.05 % Apply to affected area left  foot twice a day until improved. Avoid face, groin, axilla. Patient not taking: Reported on 02/13/2022 12/19/21   Brendolyn Patty, MD  octreotide (SANDOSTATIN LAR) 10 MG injection Inject 10 mg into the muscle every 28 (twenty-eight) days. Patient not taking: Reported on 02/13/2022 12/26/21   Cirigliano, Vito V, DO  torsemide (DEMADEX) 100 MG tablet TAKE 1 TABLET BY MOUTH EVERY MORNING (EVENING DOSE 80 MG) Patient taking differently: Take 80-100 mg by mouth See admin instructions. Take '100mg'$  by mouth every morning and 80 every evening. 01/03/22   Larey Dresser, MD     Critical care time: [place], DO Internal Medicine Resident  PGY-1 Pager: [phone]

## 2022-02-18 NOTE — Telephone Encounter (Signed)
The pt has been scheduled for EUS on 10/30 at 130 pm at [facility] with GM.  The pt will be instructed and given to the pt when discharged from the hospital.ibuprofen will also mail the instructions and a copy will be sent to My Chart

## 2022-02-18 NOTE — Progress Notes (Signed)
PT Cancellation Note  Patient Details Name: ANTONAE ZBIKOWSKI MRN: [id number] DOB: 1934/03/27   Cancelled Treatment:    Reason Eval/Treat Not Completed: Other (comment) (Pt refused to attempt EOB/OOB x2 this am. will reattempt as able.)   Alvira Philips 02/18/2022, 11:38 AM Fatou Dunnigan M,PT Acute Rehab Services [phone]

## 2022-02-19 ENCOUNTER — Encounter (HOSPITAL_COMMUNITY): Payer: Self-pay | Admitting: Internal Medicine

## 2022-02-19 DIAGNOSIS — K922 Gastrointestinal hemorrhage, unspecified: Secondary | ICD-10-CM | POA: Diagnosis not present

## 2022-02-19 LAB — COMPREHENSIVE METABOLIC PANEL
ALT: 15 U/L (ref 0–44)
ALT: 15 U/L (ref 0–44)
AST: 22 U/L (ref 15–41)
AST: 22 U/L (ref 15–41)
Albumin: 2.5 g/dL — ABNORMAL LOW (ref 3.5–5.0)
Albumin: 2.6 g/dL — ABNORMAL LOW (ref 3.5–5.0)
Alkaline Phosphatase: 40 U/L (ref 38–126)
Alkaline Phosphatase: 48 U/L (ref 38–126)
Anion gap: 8 (ref 5–15)
Anion gap: 9 (ref 5–15)
BUN: 53 mg/dL — ABNORMAL HIGH (ref 8–23)
BUN: 57 mg/dL — ABNORMAL HIGH (ref 8–23)
CO2: 22 mmol/L (ref 22–32)
CO2: 24 mmol/L (ref 22–32)
Calcium: 8.2 mg/dL — ABNORMAL LOW (ref 8.9–10.3)
Calcium: 8.3 mg/dL — ABNORMAL LOW (ref 8.9–10.3)
Chloride: 102 mmol/L (ref 98–111)
Chloride: 103 mmol/L (ref 98–111)
Creatinine, Ser: 2.03 mg/dL — ABNORMAL HIGH (ref 0.44–1.00)
Creatinine, Ser: 2.21 mg/dL — ABNORMAL HIGH (ref 0.44–1.00)
GFR, Estimated: 21 mL/min — ABNORMAL LOW (ref >60)
GFR, Estimated: 24 mL/min — ABNORMAL LOW (ref >60)
Glucose, Bld: 104 mg/dL — ABNORMAL HIGH (ref 70–99)
Glucose, Bld: 92 mg/dL (ref 70–99)
Potassium: 4.3 mmol/L (ref 3.5–5.1)
Potassium: 4.4 mmol/L (ref 3.5–5.1)
Sodium: 133 mmol/L — ABNORMAL LOW (ref 135–145)
Sodium: 135 mmol/L (ref 135–145)
Total Bilirubin: 0.9 mg/dL (ref 0.3–1.2)
Total Bilirubin: 1.1 mg/dL (ref 0.3–1.2)
Total Protein: 5.1 g/dL — ABNORMAL LOW (ref 6.5–8.1)
Total Protein: 5.1 g/dL — ABNORMAL LOW (ref 6.5–8.1)

## 2022-02-19 LAB — CBC
HCT: 23.1 % — ABNORMAL LOW (ref 36.0–46.0)
HCT: 24.5 % — ABNORMAL LOW (ref 36.0–46.0)
Hemoglobin: 7.6 g/dL — ABNORMAL LOW (ref 12.0–15.0)
Hemoglobin: 7.9 g/dL — ABNORMAL LOW (ref 12.0–15.0)
MCH: 28.7 pg (ref 26.0–34.0)
MCH: 29.2 pg (ref 26.0–34.0)
MCHC: 32.2 g/dL (ref 30.0–36.0)
MCHC: 32.9 g/dL (ref 30.0–36.0)
MCV: 88.8 fL (ref 80.0–100.0)
MCV: 89.1 fL (ref 80.0–100.0)
Platelets: 133 10*3/uL — ABNORMAL LOW (ref 150–400)
Platelets: 147 10*3/uL — ABNORMAL LOW (ref 150–400)
RBC: 2.6 MIL/uL — ABNORMAL LOW (ref 3.87–5.11)
RBC: 2.75 MIL/uL — ABNORMAL LOW (ref 3.87–5.11)
RDW: 16.9 % — ABNORMAL HIGH (ref 11.5–15.5)
RDW: 17.1 % — ABNORMAL HIGH (ref 11.5–15.5)
WBC: 10 10*3/uL (ref 4.0–10.5)
WBC: 12 10*3/uL — ABNORMAL HIGH (ref 4.0–10.5)
nRBC: 0.7 % — ABNORMAL HIGH (ref 0.0–0.2)
nRBC: 1.2 % — ABNORMAL HIGH (ref 0.0–0.2)

## 2022-02-19 LAB — MAGNESIUM: Magnesium: 2.4 mg/dL (ref 1.7–2.4)

## 2022-02-19 LAB — PHOSPHORUS: Phosphorus: 2.8 mg/dL (ref 2.5–4.6)

## 2022-02-19 MED ORDER — SODIUM CHLORIDE 0.9 % IV SOLN: INTRAVENOUS | Status: DC

## 2022-02-19 NOTE — Progress Notes (Signed)
PT Cancellation Note  Patient Details Name: [name] MRN: [id number] DOB: 01-30-1934   Cancelled Treatment:    Reason Eval/Treat Not Completed: Other (comment) Pt refused x 2 due to fatigue and nausea.  Wyona Almas, PT, DPT Acute Rehabilitation Services Office [phone]    Deno Etienne 02/19/2022, 2:58 PM

## 2022-02-19 NOTE — Progress Notes (Signed)
Occupational Therapy Treatment Patient Details Name: [name] MRN: [id number] DOB: 11-04-1933 Today's Date: 02/19/2022   History of present illness 88 yo female presents to [facility] on 10/11 with low Hgb and iron with heme + stools, covid +. Workup for UGIB. Received unit of blood 10/12 and developed possible transfusion reaction, including hypoxia, rigors. NSTEMI from demand ischemia. PMHx:  afib, dCHF, severe tricuspid regurgitation, NICM s/p ICD, COPD, PAD, CKD3, RCC s/p nephrectomy, ischemic colitis, bilat THA.   OT comments  Pt presented in bed and required increase in time and motivation to participate into session at this time. Pt was educated they may need to increase in care level or need for further rehabilitation services post dc. As pt in session was able to only stand pivot with min guard and hand held assist to chair to then complete oral care post set up, stand pivot with hand held assist and min guard to [facility] and then stand pivot with min guard to bed. Pt reporting feeling very weak throughout the session and as if they were going to get sick. Pt currently with functional limitations due to the deficits listed below (see OT Problem List).  Pt will benefit from skilled OT to increase their safety and independence with ADL and functional mobility for ADL to facilitate discharge to venue listed below.     Recommendations for follow up therapy are one component of a multi-disciplinary discharge planning process, led by the attending physician.  Recommendations may be updated based on patient status, additional functional criteria and insurance authorization.    Follow Up Recommendations  Home health OT (However if continues to make limited progress may need SNf level of therapies)    Assistance Recommended at Discharge Frequent or constant Supervision/Assistance  Patient can return home with the following  A little help with walking and/or transfers;A little help with  bathing/dressing/bathroom;Assistance with cooking/housework;Assistance with feeding;Assist for transportation   Equipment Recommendations  None recommended by OT    Recommendations for Other Services      Precautions / Restrictions Precautions Precautions: Fall Restrictions Weight Bearing Restrictions: No       Mobility Bed Mobility Overal bed mobility: Needs Assistance Bed Mobility: Supine to Sit, Sit to Supine     Supine to sit: Min assist Sit to supine: Min assist        Transfers Overall transfer level: Needs assistance Equipment used: 1 person hand held assist Transfers: Sit to/from Stand Sit to Stand: Min guard                 Balance Overall balance assessment: Needs assistance Sitting-balance support: No upper extremity supported, Feet supported Sitting balance-Leahy Scale: Good     Standing balance support: Bilateral upper extremity supported, Single extremity supported Standing balance-Leahy Scale: Fair                             ADL either performed or assessed with clinical judgement   ADL Overall ADL's : Needs assistance/impaired Eating/Feeding: Set up;Sitting   Grooming: Oral care;Set up;Sitting   Upper Body Bathing: Set up;Sitting   Lower Body Bathing: Minimal assistance;Cueing for safety;Cueing for sequencing;Sit to/from stand   Upper Body Dressing : Set up;Sitting   Lower Body Dressing: Minimal assistance;Cueing for safety;Cueing for sequencing;Sit to/from stand   Toilet Transfer: Min guard;Stand-pivot   Toileting- Clothing Manipulation and Hygiene: Maximal assistance;Cueing for safety;Cueing for sequencing;Sit to/from stand       Functional mobility  during ADLs: Min guard      Extremity/Trunk Assessment Upper Extremity Assessment Upper Extremity Assessment: Generalized weakness   Lower Extremity Assessment Lower Extremity Assessment: Defer to PT evaluation        Vision   Vision Assessment?: No apparent  visual deficits   Perception     Praxis      Cognition Arousal/Alertness: Awake/alert Behavior During Therapy: WFL for tasks assessed/performed Overall Cognitive Status: Within Functional Limits for tasks assessed                                          Exercises      Shoulder Instructions       General Comments      Pertinent Vitals/ Pain       Pain Assessment Pain Assessment: No/denies pain Facial Expression: Relaxed, neutral Body Movements: Absence of movements Muscle Tension: Relaxed Compliance with ventilator (intubated pts.): N/A Vocalization (extubated pts.): N/A CPOT Total: 0  Home Living                                          Prior Functioning/Environment              Frequency  Min 2X/week        Progress Toward Goals  OT Goals(current goals can now be found in the care plan section)  Progress towards OT goals: Progressing toward goals  Acute Rehab OT Goals Patient Stated Goal: to feel better OT Goal Formulation: With patient Time For Goal Achievement: 03/01/22 Potential to Achieve Goals: Good ADL Goals Pt Will Perform Lower Body Dressing: with modified independence;sit to/from stand Pt Will Transfer to Toilet: with modified independence;ambulating;bedside commode Pt Will Perform Toileting - Clothing Manipulation and hygiene: with modified independence;sit to/from stand Pt Will Perform Tub/Shower Transfer: Tub transfer;with modified independence;shower seat;rolling walker Additional ADL Goal #1: Pt will utilize energy conservation techniques during daily routinue with independence to optimize tolerance and safety.  Plan Discharge plan remains appropriate    Co-evaluation                 AM-PAC OT "6 Clicks" Daily Activity     Outcome Measure   Help from another person eating meals?: Total Help from another person taking care of personal grooming?: A Little Help from another person  toileting, which includes using toliet, bedpan, or urinal?: A Lot Help from another person bathing (including washing, rinsing, drying)?: A Lot Help from another person to put on and taking off regular upper body clothing?: A Little Help from another person to put on and taking off regular lower body clothing?: A Lot 6 Click Score: 13    End of Session Equipment Utilized During Treatment: Gait belt  OT Visit Diagnosis: Muscle weakness (generalized) (M62.81);Unsteadiness on feet (R26.81)   Activity Tolerance Patient limited by fatigue   Patient Left in bed;with call bell/phone within reach;with bed alarm set   Nurse Communication Mobility status        Time: 1100-1154 OT Time Calculation (min): 54 min  Charges: OT General Charges $OT Visit: 1 Visit OT Treatments $Self Care/Home Management : 53-67 mins  Joeseph Amor OTR/L  Acute Rehab Services  [phone] office number [phone] pager number   Joeseph Amor 02/19/2022, 12:05 PM

## 2022-02-19 NOTE — TOC Progression Note (Signed)
Transition of Care [facility]) - Progression Note    Patient Details  Name: [name] MRN: [id number] Date of Birth: [birth year]  [facility] of [facility]) CM/SW Contact  Jacalyn Lefevre Edson Snowball, RN Phone Number: 02/19/2022, 10:19 AM  Clinical Narrative:     Went to follow up with patient regarding home health. Patient stated "we will talk later".   Expected Discharge Plan: [place] Barriers to Discharge: Continued Medical Work up  Expected Discharge Plan and Services Expected Discharge Plan: [place] Acute Care Choice: NA Living arrangements for the past 2 months: Single Family Home                                       Social Determinants of Health (SDOH) Interventions    Readmission Risk Interventions    02/15/2021   11:38 AM 03/31/2020    1:20 PM  Readmission Risk Prevention Plan  Transportation Screening Complete Complete  PCP or Specialist Appt within 5-7 Days Complete   Home Care Screening Complete   Medication Review (RN CM) Complete   PCP or Specialist appointment within 3-5 days of discharge  Not Complete  PCP/Specialist Appt Not Complete comments  office closed for Thanksgiving holiday  [place] or [place]  Complete  SW Recovery Care/Counseling Consult  Complete  [name]  Not Applicable

## 2022-02-19 NOTE — Progress Notes (Signed)
Patients daughter stopped by and asked several questions which I tried to answer to the best of my abilities but did ask night shift to let day shift know that she will be around 1030 if they can shoot message to doctor to come and talk to her then. She is concerned regarding her HGB, I did let her know that there will be another draw tonight.

## 2022-02-19 NOTE — Progress Notes (Signed)
Patient's daughter called multiple times requesting me to consult patient's GI Doctor because patient's hemoglobin has dropped from 7.9 to 7.6. She also feels patient is not been monitored closely enough and she wants patient to be transferred back to the ICU. I assured her patient is been monitored but I am unable to consult a GI MD for her. I explained to her consults are done from Doctor to Doctor. She then requested to speak to on call provider. On call paged

## 2022-02-19 NOTE — Progress Notes (Signed)
PROGRESS NOTE  [name]  DOB: 1933/12/14  PCP: Ria Bush, MD GUY:[id number]  DOA: 02/13/2022  LOS: 6 days  Hospital Day: 7  Brief narrative: [name] is a 88 y.o. female with PMH significant for HTN, HLD,  CAD, cardiomyopathy s/p ICD, COPD, pulm hypertension, A-fib on Eliquis, aortic stenosis, GI bleed with AVMs, CKD, hypothyroidism, OA, RCC s/p right nephrectomy, celiac and SMA stenosis. Patient was diagnosed with COVID 19 a week prior and had constant fatigue.  3 days prior she started having melena.  Blood work done by PCP showed low hemoglobin and low iron level and hence sent to ED on 10/11. Patient was initially admitted to [facility].  GI was consulted. Started on blood transfusion. 10/12, while getting blood transfusion, patient started having rigors, hypoxia raising concern of transfusion reaction.  Transfusion was stopped, she was given IV Solu-Medrol.  She also developed hypotension and fever, antibiotics was started.  She complained of chest pain, and had troponin elevated. 10/13, underwent EGD that showed 2 nonbleeding AVMs s/p cautery and enteroscopy 10/14, hemoglobin continued to drop to 7.1 requiring blood transfusion transferred to ICU in light of transfusion reaction 2 days ago. 10/14, CT angio abdomen pelvis showed a new 4.7 x 3.9 cm hypervascular tumor in the pancreatic head/uncinate process, most consistent with neuroendocrine tumor.  It is adjacent to duodenum and was likely to remain to be the source of GI bleeding.  Likely metastases were found in the pancreatic tail as well as liver. 10/15, s/p angiogram and GDA/pancreaticoduodenal artery embolization by IR 10/17, after clinical stabilization, transferred out to [facility] service.  Subjective: Patient was seen and examined this morning.  Elderly Caucasian female.  Lying down in bed.  Looks very dry and weak.   No family at bedside. Chart reviewed Remains afebrile, heart rate in 60s to 80s, blood  pressure 120s Labs from this morning showed hemoglobin down to 7.6, WBC normalized, platelet 133, BUN/creatinine further worsened to 57/2.21.  Assessment and plan: Acute GI bleeding Presented with fatigue, low hemoglobin in the setting of Eliquis for A-fib as well as history of bleeding AVMs 10/13, underwent EGD that showed 2 nonbleeding AVMs s/p cautery and enteroscopy.  Hemoglobin continued to drop and hence angiogram was obtained on 10/14 which showed a new diagnosis of pancreatic mass adjacent to duodenum as the likely source of bleeding 10/15, s/p angiogram and GDA/pancreaticoduodenal artery embolization by IR GI following.  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  Likely metastases were found in the pancreatic tail as well as liver. GI plans to do EUS as an outpatient.  Acute Blood loss anemia  So far given 3 units of PRBC, 1 dose of IV iron Hemoglobin trend as below.  Last value was 7.6 this morning. Recent Labs    06/27/21 1024 08/08/21 1041 08/16/21 1303 08/28/21 1259 08/28/21 1300 09/26/21 1045 11/08/21 1306 12/20/21 1200 02/12/22 1655 02/12/22 1657 02/13/22 1625 02/13/22 1640 02/13/22 2106 02/14/22 0245 02/17/22 1217 02/18/22 0342 02/18/22 1320 02/18/22 2348 02/19/22 1003  HGB 11.6*   < > 11.6* 11.1*  --  11.8* 11.1* 11.1*   < >  --    < >  --  7.7*   < > 6.7* 8.3* 8.5* 7.9* 7.6*  MCV 89.0   < > 89.2 91.4  --  88.3 86.7 86.9   < >  --    < >  --  88.1   < > 91.1 88.1 88.6 89.1 88.8  VITAMINB12  --   --   --   --  927*  --  992*  --   --   --   --  2,173*  --   --   --   --   --   --   --   FOLATE  --   --   --   --  18.8  --   --   --   --   --   --   --   --   --   --   --   --   --   --   FERRITIN 25  --  150.9 104  --  46.6  --  30.9  --  768*  --  629*  --   --   --   --    --   --   --   TIBC 399  --  267 287  --   --   --   --   --   --   --  189*  --   --   --   --   --   --   --   IRON 68  --  56 66  --   --   --   --   --  21*  --  21*  --   --   --   --   --   --   --   RETICCTPCT  --   --   --   --   --   --   --   --   --   --   --   --  1.3  --   --   --   --   --   --    < > = values in this interval not displayed.   AKI on CKD 3b  Baseline creatinine less than 1.5.   In the last 48 hours, creatinine has been steadily worsening.  Patient looks clinically very dry.  Urine in the canister looks very concentrated.  Start on normal saline 75 mill per hour. Repeat creatinine in the morning.  Can do to monitor Recent Labs    09/26/21 1045 12/20/21 1200 02/12/22 1655 02/13/22 1625 02/14/22 1241 02/15/22 0130 02/16/22 0454 02/17/22 0300 02/18/22 2348 02/19/22 1003  BUN 42* 35* 85* 68* 49* 43* 46* 38* 53* 57*  CREATININE 1.50* 1.63* 1.34* 1.32* 1.42* 1.40* 1.33* 1.40* 2.03* 2.21*   Transfusion reaction  Per previous documentations, patient had transfusion reaction 2 years ago and on 10/12 this hospitalization.  She was diaphoretic and was hypotensive.  Received IV Solu-Medrol.  Chronic A-fib Eliquis is on hold.  Will resume once hemoglobin stabilizes.     CAD, HLD  Celiac and SMA stenosis. Patient with a paced rhythm. Patient is rate controlled. Eliquis being held. -Hold eliquis -Continue Crestor 40 mg daily and Zetia 10 mg daily  Chronic diastolic CHF Essential hypertension Chronic pulmonary hypertension Cardiomyopathy s/p ICD Mild aortic stenosis Last echo 10/13 with EF 60 to 65%, moderate concentric LVH, grade 2 diastolic dysfunction,, mild aortic stenosis, right ventricular pressure and volume overload, severely elevated pulm artery pressure to 89 mmHg PTA on torsemide 100 mg a.m., 80 mg p.m., metolazone 2.5 mg weekly, potassium supplement Currently diuretics on hold.  Patient looks dry and needs IV fluid.  Recent COVID-19 infection  with possible  pneumonia Completed 5-day course of IV Zosyn Respiratory status stable. Continue Robitussin as needed, albuterol as needed  Moderate bilateral pleural effusions Noted on CT scan 10/14. We will repeat x-ray tomorrow.  Hypothyroidism Synthroid  RCC  s/p right nephrectomy   Goals of care   Code Status: Full Code    Mobility: Encourage ambulation.  PT eval ordered  Skin assessment:     Nutritional status:  Body mass index is 22.92 kg/m.          Diet:  Diet Order             Diet full liquid Room service appropriate? Yes; Fluid consistency: Thin  Diet effective now                   DVT prophylaxis:  SCDs Start: 02/13/22 1923   Antimicrobials: None Fluid: NS at 49 mill per hour Consultants: GI Family Communication: None at bedside  Status is: Inpatient  Continue in-hospital care because: Continues to drop hemoglobin, creatinine rising.  Needs to participate with PT Level of care: Med-Surg   Dispo: The patient is from: Home              Anticipated d/c is to: Pending clinical course              Patient currently is not medically stable to d/c.   Difficult to place patient No     Infusions:   sodium chloride 75 mL/hr at 02/19/22 1316   lactated ringers      Scheduled Meds:  sodium chloride   Intravenous Once   Chlorhexidine Gluconate Cloth  6 each Topical Daily   ezetimibe  10 mg Oral Daily   pantoprazole  40 mg Oral BID   rosuvastatin  40 mg Oral Daily    PRN meds: acetaminophen **OR** acetaminophen, albuterol, guaiFENesin-dextromethorphan, lactated ringers, metoCLOPramide (REGLAN) injection, ondansetron (ZOFRAN) IV, mouth rinse   Antimicrobials: Anti-infectives (From admission, onward)    Start     Dose/Rate Route Frequency Ordered Stop   02/14/22 1400  piperacillin-tazobactam (ZOSYN) IVPB 3.375 g        3.375 g 12.5 mL/hr over 240 Minutes Intravenous Every 8 hours 02/14/22 1245 02/16/22 0702        Objective: Vitals:   02/19/22 0801 02/19/22 1213  BP: 125/76 (!) 141/56  Pulse: 80 (!) 109  Resp: 16 16  Temp: 98.4 F (36.9 C) 97.7 F (36.5 C)  SpO2: 96% 96%    Intake/Output Summary (Last 24 hours) at 02/19/2022 1401 Last data filed at 02/19/2022 0600 Gross per 24 hour  Intake 300 ml  Output 250 ml  Net 50 ml   Filed Weights   02/13/22 1507  Weight: 64.4 kg   Weight change:  Body mass index is 22.92 kg/m.   Physical Exam: General exam: Pleasant, elderly Caucasian female.  Not in physical distress.  Looks overall dry Skin: No rashes, lesions or ulcers. HEENT: Atraumatic, normocephalic, no obvious bleeding Lungs: Clear to auscultation bilaterally CVS: Regular rate and rhythm, no murmur GI/Abd soft, nontender, nondistended, bowel sound present CNS: Alert, awake, oriented to place and person Psychiatry: Sad affect Extremities: No pedal edema, no calf tenderness  Data Review: I have personally reviewed the laboratory data and studies available.  F/u labs ordered Unresulted Labs (From admission, onward)     Start     Ordered   02/20/22 0500  CBC with Differential/Platelet  Daily at 5am,   R     Question:  Specimen  collection method  Answer:  Lab=Lab collect   02/19/22 0900   02/20/22 0037  Basic metabolic panel  Daily at 5am,   R     Question:  Specimen collection method  Answer:  Lab=Lab collect   02/19/22 0900            Signed, Terrilee Croak, MD [facility] 02/19/2022

## 2022-02-20 ENCOUNTER — Inpatient Hospital Stay (HOSPITAL_COMMUNITY): Payer: PPO

## 2022-02-20 DIAGNOSIS — K922 Gastrointestinal hemorrhage, unspecified: Secondary | ICD-10-CM | POA: Diagnosis not present

## 2022-02-20 LAB — CULTURE, BLOOD (ROUTINE X 2): Culture: NO GROWTH

## 2022-02-20 LAB — CBC
HCT: 22.2 % — ABNORMAL LOW (ref 36.0–46.0)
Hemoglobin: 7 g/dL — ABNORMAL LOW (ref 12.0–15.0)
MCH: 29 pg (ref 26.0–34.0)
MCHC: 31.5 g/dL (ref 30.0–36.0)
MCV: 92.1 fL (ref 80.0–100.0)
Platelets: 134 10*3/uL — ABNORMAL LOW (ref 150–400)
RBC: 2.41 MIL/uL — ABNORMAL LOW (ref 3.87–5.11)
RDW: 17.5 % — ABNORMAL HIGH (ref 11.5–15.5)
WBC: 9.4 10*3/uL (ref 4.0–10.5)
nRBC: 0.5 % — ABNORMAL HIGH (ref 0.0–0.2)

## 2022-02-20 LAB — BASIC METABOLIC PANEL
Anion gap: 11 (ref 5–15)
BUN: 63 mg/dL — ABNORMAL HIGH (ref 8–23)
CO2: 21 mmol/L — ABNORMAL LOW (ref 22–32)
Calcium: 8.2 mg/dL — ABNORMAL LOW (ref 8.9–10.3)
Chloride: 106 mmol/L (ref 98–111)
Creatinine, Ser: 2.24 mg/dL — ABNORMAL HIGH (ref 0.44–1.00)
GFR, Estimated: 21 mL/min — ABNORMAL LOW (ref >60)
Glucose, Bld: 92 mg/dL (ref 70–99)
Potassium: 4.4 mmol/L (ref 3.5–5.1)
Sodium: 138 mmol/L (ref 135–145)

## 2022-02-20 LAB — PREPARE RBC (CROSSMATCH)

## 2022-02-20 MED ORDER — SODIUM CHLORIDE 0.9% IV SOLUTION: Freq: Once | INTRAVENOUS | Status: AC

## 2022-02-20 MED ORDER — DIPHENHYDRAMINE HCL 50 MG/ML IJ SOLN
25.0000 mg | Freq: Once | INTRAMUSCULAR | Status: AC
  Administered 2022-02-20: 25 mg via INTRAVENOUS
  Filled 2022-02-20: qty 1

## 2022-02-20 MED ORDER — METHYLPREDNISOLONE SODIUM SUCC 125 MG IJ SOLR
125.0000 mg | Freq: Once | INTRAMUSCULAR | Status: AC
  Administered 2022-02-20: 125 mg via INTRAVENOUS
  Filled 2022-02-20: qty 2

## 2022-02-20 NOTE — Progress Notes (Signed)
MD put in for patient to receive a blood transfusion, and stated that the patient has a history of blood transfusion reactions and would like to give her pre meds but still wants her on a bedside monitor. Notified him that we do not have bedside monitors so he stated that he would have the patient transferred to progressive before she got the blood. Notified charge RN and let her know what was going on.

## 2022-02-20 NOTE — Progress Notes (Signed)
Occupational Therapy Treatment Patient Details Name: [name] MRN: [id number] DOB: 1933-11-02 Today's Date: 02/20/2022   History of present illness 88 yo female presents to [facility] on 10/11 with low Hgb and iron with heme + stools, covid +. Workup for UGIB. Received unit of blood 10/12 and developed possible transfusion reaction, including hypoxia, rigors. NSTEMI from demand ischemia. PMHx:  afib, dCHF, severe tricuspid regurgitation, NICM s/p ICD, COPD, PAD, CKD3, RCC s/p nephrectomy, ischemic colitis, bilat THA.   OT comments  Pt feeling poorly, awaiting blood transfusion, reports fatigue. Pt positioned in chair position at bed level. Completed 2 grooming activities. Pt self feeding ice chips with spoon. Declined any further activity. May need to consider higher level of care upon discharge depending on progress.    Recommendations for follow up therapy are one component of a multi-disciplinary discharge planning process, led by the attending physician.  Recommendations may be updated based on patient status, additional functional criteria and insurance authorization.    Follow Up Recommendations  Home health OT (depending on progress)    Assistance Recommended at Discharge Frequent or constant Supervision/Assistance  Patient can return home with the following  A little help with walking and/or transfers;A little help with bathing/dressing/bathroom;Assistance with cooking/housework;Assistance with feeding;Assist for transportation   Equipment Recommendations  None recommended by OT    Recommendations for Other Services      Precautions / Restrictions Precautions Precautions: Fall       Mobility Bed Mobility Overal bed mobility: Needs Assistance             General bed mobility comments: Positioned pt from semi-sidelying in bed to chair position.    Transfers                   General transfer comment: declined     Balance                                            ADL either performed or assessed with clinical judgement   ADL Overall ADL's : Needs assistance/impaired Eating/Feeding: Set up;Bed level Eating/Feeding Details (indicate cue type and reason): ice chips Grooming: Oral care;Bed level;Set up;Wash/dry hands;Wash/dry face                                      Extremity/Trunk Assessment              Vision       Perception     Praxis      Cognition Arousal/Alertness: Awake/alert Behavior During Therapy: Flat affect Overall Cognitive Status: Within Functional Limits for tasks assessed                                 General Comments: pt not feeling well, hgb of 7.0, plan for transfusion        Exercises      Shoulder Instructions       General Comments      Pertinent Vitals/ Pain       Pain Assessment Pain Assessment: No/denies pain  Home Living  Prior Functioning/Environment              Frequency  Min 2X/week        Progress Toward Goals  OT Goals(current goals can now be found in the care plan section)  Progress towards OT goals: Not progressing toward goals - comment  Acute Rehab OT Goals OT Goal Formulation: With patient Time For Goal Achievement: 03/01/22 Potential to Achieve Goals: Good  Plan Discharge plan remains appropriate    Co-evaluation                 AM-PAC OT "6 Clicks" Daily Activity     Outcome Measure   Help from another person eating meals?: None Help from another person taking care of personal grooming?: A Little Help from another person toileting, which includes using toliet, bedpan, or urinal?: A Lot Help from another person bathing (including washing, rinsing, drying)?: A Lot Help from another person to put on and taking off regular upper body clothing?: A Little Help from another person to put on and taking off regular lower body clothing?:  A Lot 6 Click Score: 16    End of Session    OT Visit Diagnosis: Muscle weakness (generalized) (M62.81);Unsteadiness on feet (R26.81)   Activity Tolerance Patient limited by fatigue   Patient Left in bed;with call bell/phone within reach;with bed alarm set;with family/visitor present   Nurse Communication          Time: 4585-9292 OT Time Calculation (min): 14 min  Charges: OT General Charges $OT Visit: 1 Visit OT Treatments $Self Care/Home Management : 8-22 mins  Cleta Alberts, OTR/L Acute Rehabilitation Services Office: [phone]  Malka So 02/20/2022, 11:03 AM

## 2022-02-20 NOTE — Progress Notes (Signed)
On call provider not on site to speak with patient's daughter. Tele monitor  and labs ordered by provider.  I called patient's daughter and gave her new updates

## 2022-02-20 NOTE — Progress Notes (Signed)
PROGRESS NOTE  [name]  DOB: 01/06/1934  PCP: Ria Bush, MD XQJ:[id number]  DOA: 02/13/2022  LOS: 7 days  Hospital Day: 8  Brief narrative: [name] is a 88 y.o. female with PMH significant for HTN, HLD,  CAD, cardiomyopathy s/p ICD, COPD, pulm hypertension, A-fib on Eliquis, aortic stenosis, GI bleed with AVMs, CKD, hypothyroidism, OA, RCC s/p right nephrectomy, celiac and SMA stenosis. Patient was diagnosed with COVID 19 a week prior and had constant fatigue.  3 days prior she started having melena.  Blood work done by PCP showed low hemoglobin and low iron level and hence sent to ED on 10/11. Patient was initially admitted to [facility].  GI was consulted. Started on blood transfusion. 10/12, while getting blood transfusion, patient started having rigors, hypoxia raising concern of transfusion reaction.  Transfusion was stopped, she was given IV Solu-Medrol.  She also developed hypotension and fever, antibiotics was started.  She complained of chest pain, and had troponin elevated. 10/13, underwent EGD that showed 2 nonbleeding AVMs s/p cautery and enteroscopy 10/14, hemoglobin continued to drop to 7.1 requiring blood transfusion transferred to ICU in light of transfusion reaction 2 days ago. 10/14, CT angio abdomen pelvis showed a new 4.7 x 3.9 cm hypervascular tumor in the pancreatic head/uncinate process, most consistent with neuroendocrine tumor.  It is adjacent to duodenum and was likely to remain to be the source of GI bleeding.  Likely metastases were found in the pancreatic tail as well as liver. 10/15, s/p angiogram and GDA/pancreaticoduodenal artery embolization by IR 10/17, after clinical stabilization, transferred out to [facility] service.  Subjective: Patient was seen and examined this morning.   Lying on bed.  Looks very tired.  Her daughter at bedside.   Patient reports melanotic stools yesterday.   Labs from this morning with hemoglobin low at 7, creatinine  somewhat stable at 2.24  Assessment and plan: Acute GI bleeding Presented with fatigue, low hemoglobin in the setting of Eliquis for A-fib as well as history of bleeding AVMs 10/13, underwent EGD that showed 2 nonbleeding AVMs s/p cautery and enteroscopy.  Hemoglobin continued to drop and hence angiogram was obtained on 10/14 which showed a new diagnosis of pancreatic mass adjacent to duodenum as the likely source of bleeding 10/15, s/p angiogram and GDA/pancreaticoduodenal artery embolization by IR Continue PPI. 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  Likely metastases were found in the pancreatic tail as well as liver. GI plans to do EUS as an outpatient.  Acute Blood loss anemia  So far given 3 units of PRBC with premedication, 1 dose of IV iron Hemoglobin trend as below.  Down to 7 today.  Needs more blood transfusion.  With regards to her history of transfusion reaction, I plan to transfer to progressive care unit and premedicate before blood transfusion. Recent Labs    06/27/21 1024 08/08/21 1041 08/16/21 1303 08/28/21 1259 08/28/21 1300 09/26/21 1045 11/08/21 1306 12/20/21 1200 02/12/22 1655 02/12/22 1657 02/13/22 1625 02/13/22 1640 02/13/22 2106 02/14/22 0245 02/18/22 0342 02/18/22 1320 02/18/22 2348 02/19/22 1003 02/20/22 0100  HGB 11.6*   < > 11.6* 11.1*  --  11.8* 11.1* 11.1*   < >  --    < >  --  7.7*   < > 8.3* 8.5* 7.9* 7.6* 7.0*  MCV 89.0   < >  89.2 91.4  --  88.3 86.7 86.9   < >  --    < >  --  88.1   < > 88.1 88.6 89.1 88.8 92.1  VITAMINB12  --   --   --   --  927*  --  992*  --   --   --   --  2,173*  --   --   --   --   --   --   --   FOLATE  --   --   --   --  18.8  --   --   --   --   --   --   --   --   --   --   --   --   --   --   FERRITIN 25  --   150.9 104  --  46.6  --  30.9  --  768*  --  629*  --   --   --   --   --   --   --   TIBC 399  --  267 287  --   --   --   --   --   --   --  189*  --   --   --   --   --   --   --   IRON 68  --  56 66  --   --   --   --   --  21*  --  21*  --   --   --   --   --   --   --   RETICCTPCT  --   --   --   --   --   --   --   --   --   --   --   --  1.3  --   --   --   --   --   --    < > = values in this interval not displayed.   Transfusion reaction  Per previous documentations, patient had transfusion reaction 2 years ago and on 10/12 this hospitalization.  She was diaphoretic and was hypotensive.  Received IV Solu-Medrol and Benadryl   AKI on CKD 3b  Baseline creatinine less than 1.5.   Noted a trend in last 72 hours. I started her on IV fluid yesterday with normal saline at 75 mill per hour.  Creatinine somewhat stable today.  However patient is hypoxic this morning.  Chest x-ray this morning showed recurrent pulmonary edema. I will stop IV hydration and encourage oral hydration. Diuretics on hold Recent Labs    12/20/21 1200 02/12/22 1655 02/13/22 1625 02/14/22 1241 02/15/22 0130 02/16/22 0454 02/17/22 0300 02/18/22 2348 02/19/22 1003 02/20/22 0653  BUN 35* 85* 68* 49* 43* 46* 38* 53* 57* 63*  CREATININE 1.63* 1.34* 1.32* 1.42* 1.40* 1.33* 1.40* 2.03* 2.21* 2.24*   Chronic A-fib Eliquis is on hold.  Will resume once hemoglobin stabilizes.     CAD, HLD  Celiac and SMA stenosis. Patient with a paced rhythm. Patient is rate controlled. Eliquis being held. Continue Crestor 40 mg daily and Zetia 10 mg daily  Chronic diastolic CHF Essential hypertension Chronic pulmonary hypertension Cardiomyopathy s/p ICD Mild aortic stenosis Last echo 10/13 with EF 60 to 65%, moderate concentric LVH, grade 2 diastolic dysfunction,, mild aortic stenosis, right ventricular pressure and volume overload, severely elevated pulm artery pressure to 89  mmHg PTA on torsemide 100 mg a.m., 80 mg p.m.,  metolazone 2.5 mg weekly, potassium supplement Currently diuretics on hold.    Recent COVID-19 infection with possible pneumonia Completed 5-day course of IV Zosyn Respiratory status stable. Continue Robitussin as needed, albuterol as needed  Moderate bilateral pleural effusions Noted on CT scan 10/14. Repeat chest x-ray today 10/18 continues to show it. If respiratory status worsen, will consider thoracentesis.  Hypothyroidism Synthroid  RCC  s/p right nephrectomy   Goals of care   Code Status: Full Code.  I discussed this with patient and her daughter this morning.  Patient wants some more time to think about it.  Remains full code at this time.   Mobility: Encourage ambulation.  PT eval ordered  Skin assessment:     Nutritional status:  Body mass index is 22.92 kg/m.          Diet:  Diet Order             Diet full liquid Room service appropriate? Yes; Fluid consistency: Thin  Diet effective now                   DVT prophylaxis:  SCDs Start: 02/13/22 1923   Antimicrobials: None Fluid: Stop IV fluid today Consultants: GI Family Communication: None at bedside  Status is: Inpatient  Continue in-hospital care because: Continues to drop hemoglobin, need to monitor creatinine as well. Level of care: Progressive   Dispo: The patient is from: Home              Anticipated d/c is to: Pending clinical course              Patient currently is not medically stable to d/c.   Difficult to place patient No     Infusions:   lactated ringers      Scheduled Meds:  sodium chloride   Intravenous Once   Chlorhexidine Gluconate Cloth  6 each Topical Daily   ezetimibe  10 mg Oral Daily   pantoprazole  40 mg Oral BID   rosuvastatin  40 mg Oral Daily    PRN meds: acetaminophen **OR** acetaminophen, albuterol, guaiFENesin-dextromethorphan, lactated ringers, metoCLOPramide (REGLAN) injection, ondansetron (ZOFRAN) IV, mouth rinse    Antimicrobials: Anti-infectives (From admission, onward)    Start     Dose/Rate Route Frequency Ordered Stop   02/14/22 1400  piperacillin-tazobactam (ZOSYN) IVPB 3.375 g        3.375 g 12.5 mL/hr over 240 Minutes Intravenous Every 8 hours 02/14/22 1245 02/16/22 0702       Objective: Vitals:   02/20/22 0518 02/20/22 0904  BP: (!) 119/45 (!) 132/49  Pulse: (!) 54 69  Resp: (!) 22 15  Temp: 99.9 F (37.7 C) 97.7 F (36.5 C)  SpO2: 95% 97%    Intake/Output Summary (Last 24 hours) at 02/20/2022 1257 Last data filed at 02/20/2022 0500 Gross per 24 hour  Intake 240 ml  Output 600 ml  Net -360 ml   Filed Weights   02/13/22 1507  Weight: 64.4 kg   Weight change:  Body mass index is 22.92 kg/m.   Physical Exam: General exam: Pleasant, elderly Caucasian female.  Not in physical distress.  Looks overall dry. Skin: No rashes, lesions or ulcers. HEENT: Atraumatic, normocephalic, no obvious bleeding Lungs: Clear to auscultation bilaterally CVS: Regular rate and rhythm, systolic ejection murmur noted GI/Abd soft, nontender, nondistended, bowel sound present CNS: Alert, awake, oriented to place and person Psychiatry: Sad affect Extremities: No pedal  edema, no calf tenderness  Data Review: I have personally reviewed the laboratory data and studies available.  F/u labs ordered Unresulted Labs (From admission, onward)     Start     Ordered   02/21/22 0500  CBC with Differential/Platelet  Daily at 5am,   R     Question:  Specimen collection method  Answer:  Lab=Lab collect   02/19/22 2152   02/20/22 9191  Basic metabolic panel  Daily at 5am,   R     Question:  Specimen collection method  Answer:  Lab=Lab collect   02/19/22 0900   02/20/22 0000  CBC  Now then every 6 hours,   R     Question:  Specimen collection method  Answer:  Lab=Lab collect   02/19/22 2145            Signed, Terrilee Croak, MD [facility] 02/20/2022

## 2022-02-20 NOTE — Progress Notes (Signed)
Physical Therapy Treatment Patient Details Name: [name] MRN: [id number] DOB: 23-Jun-1933 Today's Date: 02/20/2022   History of Present Illness 88 yo female presents to [facility] on 10/11 with low Hgb and iron with heme + stools, covid +. Workup for UGIB. Received unit of blood 10/12 and developed possible transfusion reaction, including hypoxia, rigors. NSTEMI from demand ischemia. PMHx:  afib, dCHF, severe tricuspid regurgitation, NICM s/p ICD, COPD, PAD, CKD3, RCC s/p nephrectomy, ischemic colitis, bilat THA.    PT Comments    Patient fatigued and awaiting transfusion for Hgb 7.0, but agreeable to sitting EOB. Able to perform bed mobility with minA and tolerate sitting x 15 minutes. She was able to assist with repositioning in bed by performing lateral scoot transfer to R towards [facility] with minA. VSS on 2L O2 throughout. Daughter present and supportive. Continue to recommend HHPT but if patient does not progress, may need higher level of care prior to returning home.      Recommendations for follow up therapy are one component of a multi-disciplinary discharge planning process, led by the attending physician.  Recommendations may be updated based on patient status, additional functional criteria and insurance authorization.  Follow Up Recommendations  Home health PT     Assistance Recommended at Discharge Frequent or constant Supervision/Assistance  Patient can return home with the following A little help with walking and/or transfers;A little help with bathing/dressing/bathroom;Assistance with cooking/housework;Assist for transportation;Help with stairs or ramp for entrance   Equipment Recommendations  None recommended by PT    Recommendations for Other Services       Precautions / Restrictions Precautions Precautions: Fall Restrictions Weight Bearing Restrictions: No     Mobility  Bed Mobility Overal bed mobility: Needs Assistance Bed Mobility: Supine to Sit, Sit to  Supine     Supine to sit: Min assist Sit to supine: Min assist   General bed mobility comments: assist for trunk elevation and LE management to get back into bed.    Transfers Overall transfer level: Needs assistance Equipment used: None Transfers: Bed to chair/wheelchair/BSC            Lateral/Scoot Transfers: Min assist General transfer comment: assist to laterally scoot towards HOB on R side. increased time to complete due to fatigue and slight dizziness    Ambulation/Gait                   Stairs             Wheelchair Mobility    Modified Rankin (Stroke Patients Only)       Balance Overall balance assessment: Needs assistance Sitting-balance support: No upper extremity supported, Feet supported Sitting balance-Leahy Scale: Good Sitting balance - Comments: able to tolerate sitting EOB x 15 minutes                                    Cognition Arousal/Alertness: Awake/alert Behavior During Therapy: Flat affect Overall Cognitive Status: Within Functional Limits for tasks assessed                                          Exercises      General Comments        Pertinent Vitals/Pain Pain Assessment Pain Assessment: Faces Faces Pain Scale: Hurts little more Pain Location: generalized Pain Descriptors /  Indicators: Grimacing Pain Intervention(s): Monitored during session    Home Living                          Prior Function            PT Goals (current goals can now be found in the care plan section) Acute Rehab PT Goals PT Goal Formulation: With patient Time For Goal Achievement: 03/01/22 Potential to Achieve Goals: Good Progress towards PT goals: Not progressing toward goals - comment    Frequency    Min 3X/week      PT Plan Current plan remains appropriate    Co-evaluation              AM-PAC PT "6 Clicks" Mobility   Outcome Measure  Help needed turning from your  back to your side while in a flat bed without using bedrails?: A Little Help needed moving from lying on your back to sitting on the side of a flat bed without using bedrails?: A Little Help needed moving to and from a bed to a chair (including a wheelchair)?: A Little Help needed standing up from a chair using your arms (e.g., wheelchair or bedside chair)?: A Lot Help needed to walk in hospital room?: A Lot Help needed climbing 3-5 steps with a railing? : Total 6 Click Score: 14    End of Session   Activity Tolerance: Patient tolerated treatment well;Patient limited by fatigue Patient left: in bed;with call bell/phone within reach;with family/visitor present Nurse Communication: Mobility status PT Visit Diagnosis: Other abnormalities of gait and mobility (R26.89);Difficulty in walking, not elsewhere classified (R26.2)     Time: 0301-3143 PT Time Calculation (min) (ACUTE ONLY): 35 min  Charges:  $Therapeutic Activity: 23-37 mins                     [place] Fanara A. Gilford Rile PT, DPT Acute Rehabilitation Services Office [phone]    Linna Hoff 02/20/2022, 4:14 PM

## 2022-02-20 NOTE — Progress Notes (Signed)
Attempted report to 5W 1X will try again.

## 2022-02-21 ENCOUNTER — Inpatient Hospital Stay (HOSPITAL_COMMUNITY): Payer: PPO

## 2022-02-21 DIAGNOSIS — K922 Gastrointestinal hemorrhage, unspecified: Secondary | ICD-10-CM | POA: Diagnosis not present

## 2022-02-21 HISTORY — PX: IR THORACENTESIS ASP PLEURAL SPACE W/IMG GUIDE: IMG5380

## 2022-02-21 LAB — CBC WITH DIFFERENTIAL/PLATELET
Abs Immature Granulocytes: 0.32 10*3/uL — ABNORMAL HIGH (ref 0.00–0.07)
Basophils Absolute: 0 10*3/uL (ref 0.0–0.1)
Basophils Relative: 0 %
Eosinophils Absolute: 0 10*3/uL (ref 0.0–0.5)
Eosinophils Relative: 0 %
HCT: 26 % — ABNORMAL LOW (ref 36.0–46.0)
Hemoglobin: 8.1 g/dL — ABNORMAL LOW (ref 12.0–15.0)
Immature Granulocytes: 4 %
Lymphocytes Relative: 1 %
Lymphs Abs: 0.1 10*3/uL — ABNORMAL LOW (ref 0.7–4.0)
MCH: 28.9 pg (ref 26.0–34.0)
MCHC: 31.2 g/dL (ref 30.0–36.0)
MCV: 92.9 fL (ref 80.0–100.0)
Monocytes Absolute: 0.2 10*3/uL (ref 0.1–1.0)
Monocytes Relative: 2 %
Neutro Abs: 7.7 10*3/uL (ref 1.7–7.7)
Neutrophils Relative %: 93 %
Platelets: 140 10*3/uL — ABNORMAL LOW (ref 150–400)
RBC: 2.8 MIL/uL — ABNORMAL LOW (ref 3.87–5.11)
RDW: 17.3 % — ABNORMAL HIGH (ref 11.5–15.5)
WBC: 8.3 10*3/uL (ref 4.0–10.5)
nRBC: 0.5 % — ABNORMAL HIGH (ref 0.0–0.2)

## 2022-02-21 LAB — BASIC METABOLIC PANEL
Anion gap: 10 (ref 5–15)
BUN: 65 mg/dL — ABNORMAL HIGH (ref 8–23)
CO2: 21 mmol/L — ABNORMAL LOW (ref 22–32)
Calcium: 8.4 mg/dL — ABNORMAL LOW (ref 8.9–10.3)
Chloride: 108 mmol/L (ref 98–111)
Creatinine, Ser: 1.99 mg/dL — ABNORMAL HIGH (ref 0.44–1.00)
GFR, Estimated: 24 mL/min — ABNORMAL LOW (ref >60)
Glucose, Bld: 139 mg/dL — ABNORMAL HIGH (ref 70–99)
Potassium: 5.3 mmol/L — ABNORMAL HIGH (ref 3.5–5.1)
Sodium: 139 mmol/L (ref 135–145)

## 2022-02-21 LAB — TRANSFUSION REACTION
DAT C3: NEGATIVE
Post RXN DAT IgG: NEGATIVE

## 2022-02-21 MED ORDER — GUAIFENESIN ER 600 MG PO TB12
1200.0000 mg | ORAL_TABLET | Freq: Two times a day (BID) | ORAL | Status: DC
  Administered 2022-02-21 – 2022-02-22 (×4): 1200 mg via ORAL
  Filled 2022-02-21 (×8): qty 2

## 2022-02-21 MED ORDER — LIDOCAINE HCL 1 % IJ SOLN
5.0000 mL | Freq: Once | INTRAMUSCULAR | Status: DC
  Filled 2022-02-21: qty 5

## 2022-02-21 MED ORDER — LIDOCAINE HCL 1 % IJ SOLN
INTRAMUSCULAR | Status: AC
  Filled 2022-02-21: qty 20

## 2022-02-21 NOTE — Progress Notes (Signed)
Physical Therapy Treatment Patient Details Name: [name] MRN: [id number] DOB: 1934-01-26 Today's Date: 02/21/2022   History of Present Illness 88 yo female presents to [facility] on 10/11 with low Hgb and iron with heme + stools, covid +. Workup for UGIB. Received unit of blood 10/12 and developed possible transfusion reaction, including hypoxia, rigors. NSTEMI from demand ischemia. PMHx:  afib, dCHF, severe tricuspid regurgitation, NICM s/p ICD, COPD, PAD, CKD3, RCC s/p nephrectomy, ischemic colitis, bilat THA.    PT Comments    Pt remains very weak due to illness and limited participation with PT over the past week due to pt fatigue and medical issues. Pt needs much encouragement to participate and needs extended time and frequent rest breaks to perform any activity. Wanted pt to amb at least 10' to chair but she was unable to go more than a few short, shuffling steps. Addressed idea of SNF with pt and daughter and they both refused and want pt to come home with [facility] services. I believe she could benefit from SNF but will work on maximizing functional mobility for home. Will need wheelchair and 3 in 1. Daughter reports she will have 24/7 assist available.    Recommendations for follow up therapy are one component of a multi-disciplinary discharge planning process, led by the attending physician.  Recommendations may be updated based on patient status, additional functional criteria and insurance authorization.  Follow Up Recommendations  Home health PT (Pt/family are refusing SNF)     Assistance Recommended at Discharge Frequent or constant Supervision/Assistance  Patient can return home with the following Assistance with cooking/housework;Assist for transportation;Help with stairs or ramp for entrance;A lot of help with walking and/or transfers;A lot of help with bathing/dressing/bathroom   Equipment Recommendations  BSC/3in1;Wheelchair (measurements PT)    Recommendations for Other  Services       Precautions / Restrictions Precautions Precautions: Fall Restrictions Weight Bearing Restrictions: No     Mobility  Bed Mobility Overal bed mobility: Needs Assistance Bed Mobility: Supine to Sit     Supine to sit: Min assist     General bed mobility comments: assist to bring legs off of bed and elevate trunk into sitting    Transfers Overall transfer level: Needs assistance Equipment used: Rolling walker (2 wheels) Transfers: Bed to chair/wheelchair/BSC, Sit to/from Stand Sit to Stand: Min assist   Step pivot transfers: Min assist       General transfer comment: Assist to bring hips up and for balance. Bed to chair with assist for balance.    Ambulation/Gait Ambulation/Gait assistance: Min assist Gait Distance (Feet): 1 Feet Assistive device: Rolling walker (2 wheels) Gait Pattern/deviations: Step-to pattern, Decreased step length - right, Decreased step length - left, Shuffle Gait velocity: decr     General Gait Details: Attempted to ambulate around bed to recliner after taking several very small steps pt said she was too tired and wanted to sit down. Brought chair closer to perform step pivot.   Stairs             Wheelchair Mobility    Modified Rankin (Stroke Patients Only)       Balance Overall balance assessment: Needs assistance Sitting-balance support: No upper extremity supported, Feet supported Sitting balance-Leahy Scale: Fair     Standing balance support: Bilateral upper extremity supported, Reliant on assistive device for balance, During functional activity Standing balance-Leahy Scale: Poor Standing balance comment: walker and min guard for static sitting  Cognition Arousal/Alertness: Awake/alert Behavior During Therapy: Flat affect Overall Cognitive Status: Within Functional Limits for tasks assessed                                 General Comments: Pt needs  much encouragement and incr time to participate        Exercises      General Comments General comments (skin integrity, edema, etc.): SpO2 98% on 2L. Removed O2 with SpO2 decr to 85%. Replaced O2 at 2L      Pertinent Vitals/Pain Pain Assessment Pain Assessment: No/denies pain    Home Living                          Prior Function            PT Goals (current goals can now be found in the care plan section) Progress towards PT goals: Not progressing toward goals - comment (fatigue)    Frequency    Min 3X/week      PT Plan Current plan remains appropriate    Co-evaluation              AM-PAC PT "6 Clicks" Mobility   Outcome Measure  Help needed turning from your back to your side while in a flat bed without using bedrails?: A Little Help needed moving from lying on your back to sitting on the side of a flat bed without using bedrails?: A Little Help needed moving to and from a bed to a chair (including a wheelchair)?: A Little Help needed standing up from a chair using your arms (e.g., wheelchair or bedside chair)?: A Little Help needed to walk in hospital room?: Total Help needed climbing 3-5 steps with a railing? : Total 6 Click Score: 14    End of Session Equipment Utilized During Treatment: Gait belt;Oxygen Activity Tolerance: Patient limited by fatigue Patient left: with call bell/phone within reach;in chair;with chair alarm set;with family/visitor present Nurse Communication: Mobility status PT Visit Diagnosis: Other abnormalities of gait and mobility (R26.89);Difficulty in walking, not elsewhere classified (R26.2)     Time: 6629-4765 PT Time Calculation (min) (ACUTE ONLY): 41 min  Charges:  $Therapeutic Activity: 38-52 mins                     [place] Office [place] 02/21/2022, 2:59 PM

## 2022-02-21 NOTE — TOC Progression Note (Signed)
Transition of Care [facility]) - Progression Note    Patient Details  Name: SHAMBHAVI SALLEY MRN: [id number] Date of Birth: [birth year]  Transition of Care [facility]) CM/SW [place], RN Phone Number: 02/21/2022, 3:32 PM  Clinical Narrative:    DME orders placed. TOC will wait to contact DME agency until discharge plan is decided.    Expected Discharge Plan: [place] Barriers to Discharge: Continued Medical Work up  Expected Discharge Plan and Services Expected Discharge Plan: [place] Acute Care Choice: NA Living arrangements for the past 2 months: Single Family Home                                       Social Determinants of Health (SDOH) Interventions    Readmission Risk Interventions    02/15/2021   11:38 AM 03/31/2020    1:20 PM  Readmission Risk Prevention Plan  Transportation Screening Complete Complete  PCP or Specialist Appt within 5-7 Days Complete   Home Care Screening Complete   Medication Review (RN CM) Complete   PCP or Specialist appointment within 3-5 days of discharge  Not Complete  PCP/Specialist Appt Not Complete comments  office closed for Thanksgiving holiday  [place] or [place]  Complete  SW Recovery Care/Counseling Consult  Complete  [place]  Not Applicable

## 2022-02-21 NOTE — Progress Notes (Addendum)
SATURATION QUALIFICATIONS: (This note is used to comply with regulatory documentation for home oxygen)  Patient Saturations on Room Air at Rest = 85%  Patient Saturations on Room Air while Ambulating = N/A  Patient Saturations on 2 Liters of oxygen while Ambulating = 93%  Please briefly explain why patient needs home oxygen:Unable to maintain adequate level oxygenation at rest or with activity without supplemental O2.  [place] Office [phone]

## 2022-02-21 NOTE — Care Management Important Message (Signed)
Important Message  Patient Details  Name: [name] MRN: [id number] Date of Birth: 10-03-1933   Medicare Important Message Given:  Yes     Orbie Pyo 02/21/2022, 3:44 PM

## 2022-02-21 NOTE — Procedures (Signed)
PROCEDURE SUMMARY:  Successful image-guided left thoracentesis. Yielded 900 mL of hazy amber fluid. Pt tolerated procedure well. No immediate complications. EBL = trace   Specimen was not sent for labs. CXR ordered.  Patient does have right pleural effusion that is approachable for thoracentesis    Please see imaging section of Epic for full dictation.  Armando Gang Shimshon Narula PA-C 02/21/2022 1:43 PM

## 2022-02-21 NOTE — Progress Notes (Signed)
PROGRESS NOTE  [name]  DOB: 1933/06/26  PCP: Ria Bush, MD NUU:[id number]  DOA: 02/13/2022  LOS: 8 days  Hospital Day: 9  Brief narrative: [name] is a 88 y.o. female with PMH significant for HTN, HLD,  CAD, cardiomyopathy s/p ICD, COPD, pulm hypertension, A-fib on Eliquis, aortic stenosis, GI bleed with AVMs, CKD, hypothyroidism, OA, RCC s/p right nephrectomy, celiac and SMA stenosis. Patient was diagnosed with COVID 19 a week prior and had constant fatigue.  3 days prior she started having melena.  Blood work done by PCP showed low hemoglobin and low iron level and hence sent to ED on 10/11. Patient was initially admitted to [facility].  GI was consulted. Started on blood transfusion. 10/12, while getting blood transfusion, patient started having rigors, hypoxia raising concern of transfusion reaction.  Transfusion was stopped, she was given IV Solu-Medrol.  She also developed hypotension and fever, antibiotics was started.  She complained of chest pain, and had troponin elevated. 10/13, underwent EGD that showed 2 nonbleeding AVMs s/p cautery and enteroscopy 10/14, hemoglobin continued to drop to 7.1 requiring blood transfusion transferred to ICU in light of transfusion reaction 2 days ago. 10/14, CT angio abdomen pelvis showed a new 4.7 x 3.9 cm hypervascular tumor in the pancreatic head/uncinate process, most consistent with neuroendocrine tumor.  It is adjacent to duodenum and was likely to remain to be the source of GI bleeding.  Likely metastases were found in the pancreatic tail as well as liver. 10/15, s/p angiogram and GDA/pancreaticoduodenal artery embolization by IR 10/17, after clinical stabilization, patient was transferred out to [facility] service. For details  Subjective: Patient was seen and examined this morning.   Sitting up in bed.  Not in distress.   Feels better today. Productive cough, having trouble bringing up. Received blood transfusion with  premedication yesterday.  No transfusion reaction. Labs from this morning show improvement in hemoglobin as well as creatinine. Patient has been off diuretics for last few days.  Getting more short of breath and requiring oxygen.  Assessment and plan: Acute GI bleeding Presented with fatigue, low hemoglobin in the setting of Eliquis for A-fib as well as history of bleeding AVMs 10/13, underwent EGD that showed 2 nonbleeding AVMs s/p cautery and enteroscopy.  Hemoglobin continued to drop and hence angiogram was obtained on 10/14 which showed a new diagnosis of pancreatic mass adjacent to duodenum as the likely source of bleeding 10/15, s/p angiogram and GDA/pancreaticoduodenal artery embolization by IR Continue PPI. 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  Likely metastases were found in the pancreatic tail as well as liver. GI plans to do EUS as an outpatient.  Acute Blood loss anemia  Last PRBC transfusion yesterday.  Patient received 4 units of PRBC with premedication, 1 dose of IV iron Hemoglobin trend as below.  8.1 this morning.  Recent Labs    06/27/21 1024 08/08/21 1041 08/16/21 1303 08/28/21 1259 08/28/21 1300 09/26/21 1045 11/08/21 1306 12/20/21 1200 02/12/22 1655 02/12/22 1657 02/13/22 1625 02/13/22 1640 02/13/22 2106 02/14/22 0245 02/18/22 1320 02/18/22 2348 02/19/22 1003 02/20/22 0100 02/21/22 0218  HGB 11.6*   < > 11.6* 11.1*  --  11.8* 11.1* 11.1*   < >  --    < >  --  7.7*   < > 8.5* 7.9* 7.6* 7.0* 8.1*  MCV 89.0   < > 89.2 91.4  --  88.3 86.7 86.9   < >  --    < >  --  88.1   < > 88.6 89.1 88.8 92.1 92.9  VITAMINB12  --   --   --   --  927*  --  992*  --   --   --   --  2,173*  --   --   --   --   --   --   --   FOLATE  --   --   --   --  18.8  --   --   --    --   --   --   --   --   --   --   --   --   --   --   FERRITIN 25  --  150.9 104  --  46.6  --  30.9  --  768*  --  629*  --   --   --   --   --   --   --   TIBC 399  --  267 287  --   --   --   --   --   --   --  189*  --   --   --   --   --   --   --   IRON 68  --  56 66  --   --   --   --   --  21*  --  21*  --   --   --   --   --   --   --   RETICCTPCT  --   --   --   --   --   --   --   --   --   --   --   --  1.3  --   --   --   --   --   --    < > = values in this interval not displayed.   Transfusion reaction  Per previous documentations, patient had transfusion reaction 2 years ago and on 10/12 this hospitalization.  She was diaphoretic and was hypotensive.   Before last transfusion yesterday 10/18, she was premedicated with IV Benadryl and IV steroids and hence she did not have any transfusion reaction.  Chronic diastolic CHF Essential hypertension Chronic pulmonary hypertension Cardiomyopathy s/p ICD Mild aortic stenosis Last echo 10/13 with EF 60 to 65%, moderate concentric LVH, grade 2 diastolic dysfunction,, mild aortic stenosis, right ventricular pressure and volume overload, severely elevated pulm artery pressure to 89 mmHg PTA on torsemide 100 mg a.m., 80 mg p.m., metolazone 2.5 mg weekly, potassium supplement Currently diuretics on hold.  Diuretics plan as below.   AKI on CKD 3b  Baseline creatinine less than 1.5.   Noted a trend in last 3 to 4 days.  Creatinine worsened to peak at 2.24.  Improving now.  Given her severe right-sided heart failure and pulmonary hypertension, she needs some form of diuresis.  If her creatinine does not worsen tomorrow, I will put her back on torsemide.   Recent Labs    02/12/22 1655 02/13/22 1625 02/14/22 1241 02/15/22 0130 02/16/22 0454 02/17/22 0300 02/18/22 2348 02/19/22 1003 02/20/22 0653 02/21/22 0218  BUN 85* 68* 49* 43* 46* 38* 53* 57* 63* 65*  CREATININE 1.34* 1.32* 1.42* 1.40* 1.33* 1.40* 2.03* 2.21* 2.24* 1.99*  Chronic A-fib Eliquis is on hold.  Will resume once hemoglobin stabilizes.     CAD, HLD  Celiac and SMA stenosis. Patient with a paced rhythm. Patient is rate controlled. Eliquis being held. Continue Crestor 40 mg daily and Zetia 10 mg daily  Recent COVID-19 infection with possible pneumonia Completed 5-day course of IV Zosyn Respiratory status stable. Continue Robitussin as needed, albuterol as needed  Moderate bilateral pleural effusions Noted on CT scan 10/14. Repeat chest x-ray today 10/18 continues to show it. I will order for ultrasound guided thoracentesis.  Hypothyroidism Synthroid  RCC  s/p right nephrectomy   Goals of care   Code Status: Full Code.    Mobility: Encourage ambulation.  PT eval suggested home health PT.  Skin assessment:     Nutritional status:  Body mass index is 22.92 kg/m.          Diet:  Diet Order             Diet full liquid Room service appropriate? Yes; Fluid consistency: Thin  Diet effective now                   DVT prophylaxis:  SCDs Start: 02/13/22 1923   Antimicrobials: None Fluid: Not on IV fluid Consultants: GI Family Communication: Daughter at bedside  Status is: Inpatient  Continue in-hospital care because: Continue monitor for hemoglobin and renal function. Level of care: Progressive   Dispo: The patient is from: Home              Anticipated d/c is to: Pending clinical course              Patient currently is not medically stable to d/c.   Difficult to place patient No     Infusions:   lactated ringers      Scheduled Meds:  Chlorhexidine Gluconate Cloth  6 each Topical Daily   ezetimibe  10 mg Oral Daily   guaiFENesin  1,200 mg Oral BID   pantoprazole  40 mg Oral BID   rosuvastatin  40 mg Oral Daily    PRN meds: acetaminophen **OR** acetaminophen, albuterol, guaiFENesin-dextromethorphan, lactated ringers, metoCLOPramide (REGLAN) injection, ondansetron (ZOFRAN) IV, mouth rinse    Antimicrobials: Anti-infectives (From admission, onward)    Start     Dose/Rate Route Frequency Ordered Stop   02/14/22 1400  piperacillin-tazobactam (ZOSYN) IVPB 3.375 g        3.375 g 12.5 mL/hr over 240 Minutes Intravenous Every 8 hours 02/14/22 1245 02/16/22 0702       Objective: Vitals:   02/21/22 0300 02/21/22 0853  BP: (!) 148/57 (!) 160/70  Pulse: 71 81  Resp: (!) 23 16  Temp: 97.6 F (36.4 C) 98 F (36.7 C)  SpO2: 95% 98%    Intake/Output Summary (Last 24 hours) at 02/21/2022 1116 Last data filed at 02/21/2022 0300 Gross per 24 hour  Intake --  Output 350 ml  Net -350 ml   Filed Weights   02/13/22 1507  Weight: 64.4 kg   Weight change:  Body mass index is 22.92 kg/m.   Physical Exam: General exam: Pleasant, elderly Caucasian female.  Not in physical distress.  Looks overall dry. Skin: No rashes, lesions or ulcers. HEENT: Atraumatic, normocephalic, no obvious bleeding Lungs: Clear to auscultation bilaterally but tachypneic on 3 L oxygen by nasal collar. CVS: Regular rate and rhythm, systolic ejection murmur noted GI/Abd soft, nontender, nondistended, bowel sound present CNS: Alert, awake, oriented to place and person Psychiatry: Mood appropriate Extremities:  No pedal edema, no calf tenderness  Data Review: I have personally reviewed the laboratory data and studies available.  F/u labs ordered Unresulted Labs (From admission, onward)     Start     Ordered   02/21/22 0500  CBC with Differential/Platelet  Daily at 5am,   R     Question:  Specimen collection method  Answer:  Lab=Lab collect   02/19/22 2152   02/20/22 2446  Basic metabolic panel  Daily at 5am,   R     Question:  Specimen collection method  Answer:  Lab=Lab collect   02/19/22 0900   02/20/22 0000  CBC  Now then every 6 hours,   R     Question:  Specimen collection method  Answer:  Lab=Lab collect   02/19/22 2145            Signed, Terrilee Croak, MD [facility] 02/21/2022

## 2022-02-22 ENCOUNTER — Inpatient Hospital Stay (HOSPITAL_COMMUNITY): Payer: PPO

## 2022-02-22 DIAGNOSIS — K8689 Other specified diseases of pancreas: Secondary | ICD-10-CM | POA: Diagnosis not present

## 2022-02-22 DIAGNOSIS — D62 Acute posthemorrhagic anemia: Secondary | ICD-10-CM | POA: Diagnosis not present

## 2022-02-22 DIAGNOSIS — K922 Gastrointestinal hemorrhage, unspecified: Secondary | ICD-10-CM | POA: Diagnosis not present

## 2022-02-22 DIAGNOSIS — D649 Anemia, unspecified: Secondary | ICD-10-CM | POA: Diagnosis not present

## 2022-02-22 HISTORY — PX: IR THORACENTESIS ASP PLEURAL SPACE W/IMG GUIDE: IMG5380

## 2022-02-22 LAB — BASIC METABOLIC PANEL
Anion gap: 7 (ref 5–15)
BUN: 57 mg/dL — ABNORMAL HIGH (ref 8–23)
CO2: 22 mmol/L (ref 22–32)
Calcium: 8.1 mg/dL — ABNORMAL LOW (ref 8.9–10.3)
Chloride: 108 mmol/L (ref 98–111)
Creatinine, Ser: 1.64 mg/dL — ABNORMAL HIGH (ref 0.44–1.00)
GFR, Estimated: 30 mL/min — ABNORMAL LOW (ref >60)
Glucose, Bld: 151 mg/dL — ABNORMAL HIGH (ref 70–99)
Potassium: 5.4 mmol/L — ABNORMAL HIGH (ref 3.5–5.1)
Sodium: 137 mmol/L (ref 135–145)

## 2022-02-22 LAB — CBC WITH DIFFERENTIAL/PLATELET
Abs Immature Granulocytes: 0.11 10*3/uL — ABNORMAL HIGH (ref 0.00–0.07)
Basophils Absolute: 0 10*3/uL (ref 0.0–0.1)
Basophils Relative: 0 %
Eosinophils Absolute: 0 10*3/uL (ref 0.0–0.5)
Eosinophils Relative: 0 %
HCT: 22.9 % — ABNORMAL LOW (ref 36.0–46.0)
Hemoglobin: 7.5 g/dL — ABNORMAL LOW (ref 12.0–15.0)
Immature Granulocytes: 2 %
Lymphocytes Relative: 4 %
Lymphs Abs: 0.2 10*3/uL — ABNORMAL LOW (ref 0.7–4.0)
MCH: 30 pg (ref 26.0–34.0)
MCHC: 32.8 g/dL (ref 30.0–36.0)
MCV: 91.6 fL (ref 80.0–100.0)
Monocytes Absolute: 0.4 10*3/uL (ref 0.1–1.0)
Monocytes Relative: 7 %
Neutro Abs: 4.1 10*3/uL (ref 1.7–7.7)
Neutrophils Relative %: 87 %
Platelets: 163 10*3/uL (ref 150–400)
RBC: 2.5 MIL/uL — ABNORMAL LOW (ref 3.87–5.11)
RDW: 17.3 % — ABNORMAL HIGH (ref 11.5–15.5)
WBC: 4.7 10*3/uL (ref 4.0–10.5)
nRBC: 0 % (ref 0.0–0.2)

## 2022-02-22 MED ORDER — LIDOCAINE HCL 1 % IJ SOLN
INTRAMUSCULAR | Status: AC
  Filled 2022-02-22: qty 20

## 2022-02-22 MED ORDER — SODIUM ZIRCONIUM CYCLOSILICATE 10 G PO PACK
10.0000 g | PACK | Freq: Once | ORAL | Status: AC
  Administered 2022-02-22: 10 g via ORAL
  Filled 2022-02-22: qty 1

## 2022-02-22 NOTE — Progress Notes (Signed)
Physical Therapy Treatment Patient Details Name: [name] MRN: [id number] DOB: 1934-04-05 Today's Date: 02/22/2022   History of Present Illness Pt is 88 yo female [facility] with low Hgb and iron with heme + stools, covid +. Workup for UGIB. Received unit of blood 10/12 and developed possible transfusion reaction, including hypoxia, rigors. NSTEMI from demand ischemia. 10/14 likely neuroendocrine tumor and likely metastases pancreas/liver; 10/15 angiogram and artery embolization; 10/19 L thoracentesis 900 mL; 10/20 plan for R thoracentesis PMHx:  afib, dCHF, severe tricuspid regurgitation, NICM s/p ICD, COPD, PAD, CKD3, RCC s/p nephrectomy, ischemic colitis, bilat THA.    PT Comments    Pt remains easily fatigued.  She was up in chair ~1 hour and wanting to go back to bed.  Min A for transfers but fatigued with transfer and declined ambulation due to fatigue and to go for thoracentesis soon. Will continue to try to increase mobility and activity tolerance as able.     Recommendations for follow up therapy are one component of a multi-disciplinary discharge planning process, led by the attending physician.  Recommendations may be updated based on patient status, additional functional criteria and insurance authorization.  Follow Up Recommendations  Home health PT (Would benefit froM SNF but family/pt decline)     Assistance Recommended at Discharge Frequent or constant Supervision/Assistance  Patient can return home with the following A little help with walking and/or transfers;A little help with bathing/dressing/bathroom;Assistance with cooking/housework;Help with stairs or ramp for entrance   Equipment Recommendations  BSC/3in1;Wheelchair (measurements PT)    Recommendations for Other Services       Precautions / Restrictions Precautions Precautions: Fall Restrictions Weight Bearing Restrictions: No     Mobility  Bed Mobility Overal bed mobility: Needs  Assistance Bed Mobility: Sit to Supine       Sit to supine: Min assist   General bed mobility comments: Assist for legs onto bed and increased time/cues    Transfers Overall transfer level: Needs assistance Equipment used: Rolling walker (2 wheels) Transfers: Sit to/from Stand, Bed to chair/wheelchair/BSC Sit to Stand: Min assist   Step pivot transfers: Min assist       General transfer comment: Min A to stand and steady with small steps to bed.    Ambulation/Gait               General Gait Details: Reports fatigued and declined further ambulation due to fatigue and to go for thoracentesis soon   Stairs             Wheelchair Mobility    Modified Rankin (Stroke Patients Only)       Balance Overall balance assessment: Needs assistance Sitting-balance support: No upper extremity supported, Feet supported Sitting balance-Leahy Scale: Good     Standing balance support: Single extremity supported, Bilateral upper extremity supported Standing balance-Leahy Scale: Poor Standing balance comment: Requiring at least single UE support and RW for trnasitions                            Cognition Arousal/Alertness: Awake/alert Behavior During Therapy: Flat affect Overall Cognitive Status: Impaired/Different from baseline Area of Impairment: Problem solving, Memory                     Memory: Decreased short-term memory       Problem Solving: Decreased initiation, Requires verbal cues          Exercises  General Comments General comments (skin integrity, edema, etc.): VSS.  Noted L lower arm with + edema , pt reports has been like this - recommended removing blood pressure cuff when not in use as edema improved in upper arm      Pertinent Vitals/Pain Pain Assessment Pain Assessment: No/denies pain    Home Living                          Prior Function            PT Goals (current goals can now be found in  the care plan section) Acute Rehab PT Goals PT Goal Formulation: With patient Time For Goal Achievement: 03/01/22 Potential to Achieve Goals: Good Progress towards PT goals: Not progressing toward goals - comment (fatigue, ongoing medical complications)    Frequency    Min 3X/week      PT Plan Current plan remains appropriate    Co-evaluation              AM-PAC PT "6 Clicks" Mobility   Outcome Measure  Help needed turning from your back to your side while in a flat bed without using bedrails?: A Little Help needed moving from lying on your back to sitting on the side of a flat bed without using bedrails?: A Little Help needed moving to and from a bed to a chair (including a wheelchair)?: A Little Help needed standing up from a chair using your arms (e.g., wheelchair or bedside chair)?: A Little Help needed to walk in hospital room?: Total Help needed climbing 3-5 steps with a railing? : Total 6 Click Score: 14    End of Session Equipment Utilized During Treatment: Gait belt;Oxygen Activity Tolerance: Patient limited by fatigue Patient left: with family/visitor present;in bed;with bed alarm set;with call bell/phone within reach Nurse Communication: Mobility status PT Visit Diagnosis: Other abnormalities of gait and mobility (R26.89);Difficulty in walking, not elsewhere classified (R26.2)     Time: 8101-7510 PT Time Calculation (min) (ACUTE ONLY): 15 min  Charges:  $Therapeutic Activity: 8-22 mins                     Abran Richard, PT Acute Rehab [facility] [phone]    Karlton Lemon 02/22/2022, 3:18 PM

## 2022-02-22 NOTE — Progress Notes (Signed)
Occupational Therapy Treatment Patient Details Name: [name] MRN: [id number] DOB: 09/19/1933 Today's Date: 02/22/2022   History of present illness 88 yo female presents to [facility] on 10/11 with low Hgb and iron with heme + stools, covid +. Workup for UGIB. Received unit of blood 10/12 and developed possible transfusion reaction, including hypoxia, rigors. NSTEMI from demand ischemia. PMHx:  afib, dCHF, severe tricuspid regurgitation, NICM s/p ICD, COPD, PAD, CKD3, RCC s/p nephrectomy, ischemic colitis, bilat THA.   OT comments  Patient is still needing a similar amount of assist, but she is starting to tolerate the treatments better.  Home with initial 24 hour from daughter and [facility] OT is more realistic.  OT will continue efforts to increase toileting and ADL independence.     Recommendations for follow up therapy are one component of a multi-disciplinary discharge planning process, led by the attending physician.  Recommendations may be updated based on patient status, additional functional criteria and insurance authorization.    Follow Up Recommendations  Home health OT    Assistance Recommended at Discharge Frequent or constant Supervision/Assistance  Patient can return home with the following  A little help with walking and/or transfers;A little help with bathing/dressing/bathroom;Assistance with cooking/housework;Assistance with feeding;Assist for transportation   Equipment Recommendations  None recommended by OT    Recommendations for Other Services      Precautions / Restrictions Precautions Precautions: Fall Restrictions Weight Bearing Restrictions: No       Mobility Bed Mobility Overal bed mobility: Needs Assistance Bed Mobility: Supine to Sit     Supine to sit: Min assist          Transfers Overall transfer level: Needs assistance Equipment used: Rolling walker (2 wheels) Transfers: Bed to chair/wheelchair/BSC, Sit to/from Stand Sit to Stand: Min  assist     Step pivot transfers: Min assist           Balance Overall balance assessment: Needs assistance Sitting-balance support: No upper extremity supported, Feet supported Sitting balance-Leahy Scale: Good     Standing balance support: Single extremity supported Standing balance-Leahy Scale: Fair                             ADL either performed or assessed with clinical judgement   ADL                       Lower Body Dressing: Minimal assistance;Sit to/from stand   Toilet Transfer: Min guard;Stand-pivot                  Extremity/Trunk Assessment Upper Extremity Assessment Upper Extremity Assessment: Generalized weakness   Lower Extremity Assessment Lower Extremity Assessment: Defer to PT evaluation   Cervical / Trunk Assessment Cervical / Trunk Assessment: Normal    Vision       Perception     Praxis      Cognition Arousal/Alertness: Awake/alert Behavior During Therapy: Flat affect Overall Cognitive Status: Within Functional Limits for tasks assessed Area of Impairment: Problem solving                             Problem Solving: Decreased initiation, Requires verbal cues                             Pertinent Vitals/ Pain       Pain Assessment  Pain Assessment: No/denies pain Pain Intervention(s): Monitored during session                                                          Frequency  Min 2X/week        Progress Toward Goals  OT Goals(current goals can now be found in the care plan section)  Progress towards OT goals: Progressing toward goals  Acute Rehab OT Goals OT Goal Formulation: With patient Time For Goal Achievement: 03/01/22 Potential to Achieve Goals: Good  Plan Discharge plan remains appropriate    Co-evaluation                 AM-PAC OT "6 Clicks" Daily Activity     Outcome Measure   Help from another person eating meals?:  None Help from another person taking care of personal grooming?: None Help from another person toileting, which includes using toliet, bedpan, or urinal?: A Little Help from another person bathing (including washing, rinsing, drying)?: A Lot Help from another person to put on and taking off regular upper body clothing?: A Little Help from another person to put on and taking off regular lower body clothing?: A Lot 6 Click Score: 18    End of Session    OT Visit Diagnosis: Muscle weakness (generalized) (M62.81);Unsteadiness on feet (R26.81)   Activity Tolerance Patient limited by fatigue   Patient Left in bed;with call bell/phone within reach;with bed alarm set;with family/visitor present   Nurse Communication Mobility status        Time: 0093-8182 OT Time Calculation (min): 18 min  Charges: OT General Charges $OT Visit: 1 Visit OT Treatments $Self Care/Home Management : 8-22 mins  02/22/2022  RP, OTR/L  Acute Rehabilitation Services  Office:  [phone]   Metta Clines 02/22/2022, 2:27 PM

## 2022-02-22 NOTE — Procedures (Signed)
Ultrasound-guided therapeutic right sided thoracentesis performed yielding 850 milliliter of straw colored fluid. No immediate complications.   Follow-up chest x-ray pending. EBL is < 2 ml.

## 2022-02-22 NOTE — Progress Notes (Addendum)
[facility] Progress Note  CC:  GIB.  Sp embolization GDA and pancreaticoduodenal arcade   Subjective:  Patient sitting up in the chair.  Is going for another thoracentesis later today.  No abdominal pain, she says no BM yet today.  No sign of GI bleeding per her nurse.  Objective:  Vital signs in last 24 hours: Temp:  [98.1 F (36.7 C)-98.7 F (37.1 C)] 98.6 F (37 C) (10/20 0813) Pulse Rate:  [61-75] 64 (10/20 0700) Resp:  [15-19] 15 (10/20 0700) BP: (106-136)/(42-74) 110/74 (10/20 0700) SpO2:  [95 %-99 %] 95 % (10/20 0700) Last BM Date : 02/18/22 General:  Alert, chronically ill-appearing, in NAD Heart:  Regular rate and rhythm; no murmurs Pulm:  CTAB.  No W/R/R. Abdomen:  Soft, non-distended.  BS present.  Non-tender. Extremities:  Without edema. Neurologic:  Alert and oriented x 4;  grossly normal neurologically. Psych:  Alert and cooperative. Normal mood and affect.  Intake/Output from previous day: 10/19 0701 - 10/20 0700 In: -  Out: 700 [Urine:700] Intake/Output this shift: Total I/O In: -  Out: 700 [Urine:700]  Lab Results: Recent Labs    02/20/22 0100 02/21/22 0218 02/22/22 0306  WBC 9.4 8.3 4.7  HGB 7.0* 8.1* 7.5*  HCT 22.2* 26.0* 22.9*  PLT 134* 140* 163   BMET Recent Labs    02/20/22 0653 02/21/22 0218 02/22/22 0306  NA 138 139 137  K 4.4 5.3* 5.4*  CL 106 108 108  CO2 21* 21* 22  GLUCOSE 92 139* 151*  BUN 63* 65* 57*  CREATININE 2.24* 1.99* 1.64*  CALCIUM 8.2* 8.4* 8.1*   IR THORACENTESIS ASP PLEURAL SPACE W/IMG GUIDE  Result Date: 02/21/2022 INDICATION: Shortness breath, previous chest x-ray showed bilateral pleural effusion. Request for therapeutic thoracentesis. EXAM: ULTRASOUND GUIDED LEFT THORACENTESIS MEDICATIONS: 10 mL 1% lidocaine COMPLICATIONS: None immediate. PROCEDURE: An ultrasound guided thoracentesis was thoroughly discussed with the patient and questions answered. The benefits, risks, alternatives and  complications were also discussed. The patient understands and wishes to proceed with the procedure. Written consent was obtained. Ultrasound was performed to localize and mark an adequate pocket of fluid in the left chest. The area was then prepped and draped in the normal sterile fashion. 1% Lidocaine was used for local anesthesia. Under ultrasound guidance a 6 Fr Safe-T-Centesis catheter was introduced. Thoracentesis was performed. The catheter was removed and a dressing applied. FINDINGS: A total of approximately 900 mL of hazy amber fluid was removed. Post procedure chest X-ray reviewed, negative for pneumothorax. IMPRESSION: Successful ultrasound guided left thoracentesis yielding 900 mL of pleural fluid. Read by: Durenda Guthrie, PA-C Electronically Signed   By: Corrie Mckusick D.O.   On: 02/21/2022 17:00   DG Chest 1 View  Result Date: 02/21/2022 CLINICAL DATA:  Shortness of breath.  Left-sided thoracentesis EXAM: CHEST  1 VIEW COMPARISON:  02/20/2022 FINDINGS: Left-sided implanted cardiac device remains in place. Stable heart size. Aortic atherosclerosis. Decreased left-sided pleural effusion. No significant residual left-sided effusion. Persistent small right pleural effusion. Improving aeration of the lung bases. No pneumothorax. IMPRESSION: Decreased left-sided pleural effusion status post thoracentesis. No pneumothorax. Electronically Signed   By: Davina Poke D.O.   On: 02/21/2022 14:08    Assessment / Plan: GI bleed.  Melena: acute, recurrent.   11/2021 VCE showed likely distal ileal ulcer.   02/15/2022 SBE: Esophagus, stomach normal.  Hiatal hernia.  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  Examined jejunum normal. 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  No active bleeding on angiography but Dr. Serafina Royals elected to proceed with coil embolization of GDA and pancreaticoduodenal arcade. Protonix 40 mg po bid in place.   No sign of GI bleeding currently.    Blood loss anemia.  IV iron on 10/14.  4 PRBCs so far, last on 10/19.  Hgb 7.5 grams this AM.   A-fib.  On chronic Eliquis.  Currently on hold.   Covid 19 dx 10/11.  Off precautions.    -Has EUS scheduled with Dr. Rush Landmark 10/30. -Not sure there is anything else to do from a GI standpoint and does not seem to be having any sign of bleeding. -Monitor Hgb.     LOS: 9 days   Laban Emperor. Zehr  02/22/2022, 12:58 PM     Attending Physician Note   I have taken an interval history, reviewed the chart and examined the patient. I performed a substantive portion of this encounter, including complete performance of at least one of the key components, in conjunction with the APP. I agree with the APP's note, impression and recommendations with my edits. My additional impressions and recommendations are as follows.   Anemia without current evidence of GI bleeding. Pancreatic mass S/P embolization of GDA and pancreaticoduodenal arcade 5 days ago. Duodenal AVMs treated at SBE 7 days ago.  Per patient, daughter and nurse no signs of GI bleeding over the past few days. No bowel movement today. Reviewed all findings with the patient and her daughter. Monitor CBC and monitor for signs of GI bleeding. Continue pantoprazole 40 mg po bid. Please contact us if overt GI bleeding noted.    Lucio Edward, MD [facility] See AM[facility], [facility], for our on call provider

## 2022-02-22 NOTE — TOC Progression Note (Signed)
Transition of Care [facility]) - Progression Note    Patient Details  Name: [name] MRN: [id number] Date of Birth: 18-Nov-1933  Transition of Care [facility]) CM/SW [place], RN Phone Number: 02/22/2022, 5:16 PM  Clinical Narrative:     Spoke at bedside to patient and daughter [name]. Introduced self and explained role.  They would [place]. . If [facility] cannot [facility] they wish to be notified. Ordered DME wheelchair and BSC via [employer]. Left message regarding orders. Both will need follow up in AM.  She will likely need oxygen, messaged RN to do walking qualifications. Once this is done then oxygen can be ordered.  CM will continue [place].  Expected Discharge Plan: [place] Barriers to Discharge: Continued Medical Work up  Expected Discharge Plan and Services Expected Discharge Plan: [place] Choice: NA Living arrangements for the past 2 months: Single Family Home                 DME Arranged: Wheelchair manual, 3-N-1 DME Agency: [employer] Date DME Agency Contacted: 02/22/22   Representative spoke with at DME Agency: lucreatia left messager   [place] Agency: [facility] [place] Date [facility] Agency Contacted: 02/22/22 Time [place]: 1716 Representative spoke with at [place]: Left message with rep to call back   Social Determinants of Health (SDOH) Interventions    Readmission Risk Interventions    02/15/2021   11:38 AM 03/31/2020    1:20 PM  Readmission Risk Prevention Plan  Transportation Screening Complete Complete  PCP or Specialist Appt within 5-7 Days Complete   Home Care Screening Complete   Medication Review (RN CM) Complete   PCP or Specialist appointment within 3-5 days of discharge  Not Complete  PCP/Specialist Appt Not Complete comments  office closed for Thanksgiving holiday  [place] or [place]  Complete  SW Recovery Care/Counseling Consult  Complete   [place]  Not Applicable

## 2022-02-22 NOTE — Progress Notes (Addendum)
PROGRESS NOTE  [name]  DOB: 28-Nov-1933  PCP: Ria Bush, MD UKG:[id number]  DOA: 02/13/2022  LOS: 9 days  Hospital Day: 10  Brief narrative: [name] is a 88 y.o. female with PMH significant for HTN, HLD,  CAD, cardiomyopathy s/p ICD, COPD, pulm hypertension, A-fib on Eliquis, aortic stenosis, GI bleed with AVMs, CKD, hypothyroidism, OA, RCC s/p right nephrectomy, celiac and SMA stenosis. Patient was diagnosed with COVID 19 a week prior and had constant fatigue.  3 days prior she started having melena.  Blood work done by PCP showed low hemoglobin and low iron level and hence sent to ED on 10/11. Patient was initially admitted to [facility].  GI was consulted. Started on blood transfusion. 10/12, while getting blood transfusion, patient started having rigors, hypoxia raising concern of transfusion reaction.  Transfusion was stopped, she was given IV Solu-Medrol.  She also developed hypotension and fever, antibiotics was started.  She complained of chest pain, and had troponin elevated. 10/13, underwent EGD that showed 2 nonbleeding AVMs s/p cautery and enteroscopy 10/14, hemoglobin continued to drop to 7.1 requiring blood transfusion transferred to ICU in light of transfusion reaction 2 days ago. 10/14, CT angio abdomen pelvis showed a new 4.7 x 3.9 cm hypervascular tumor in the pancreatic head/uncinate process, most consistent with neuroendocrine tumor.  It is adjacent to duodenum and was likely to remain to be the source of GI bleeding.  Likely metastases were found in the pancreatic tail as well as liver. 10/15, s/p angiogram and GDA/pancreaticoduodenal artery embolization by IR 10/17, after clinical stabilization, patient was transferred out to [facility] service. For details  Subjective: Patient was seen and examined this morning.   Sitting up in bed.  Not in distress.  Breathing better than yesterday.  Able to cough up.  Felt better after thoracentesis yesterday but still  on 3 L oxygen by nasal cannula. Labs from this morning with creatinine improving, hemoglobin trending down as well. [name] at bedside requesting to speak with GI.  Assessment and plan: Acute GI bleeding Presented with fatigue, low hemoglobin in the setting of Eliquis for A-fib as well as history of bleeding AVMs 10/13, underwent EGD that showed 2 nonbleeding AVMs s/p cautery and enteroscopy.  Hemoglobin continued to drop and hence angiogram was obtained on 10/14 which showed a new diagnosis of pancreatic mass adjacent to duodenum as the likely source of bleeding 10/15, s/p angiogram and GDA/pancreaticoduodenal artery embolization by IR.   Based on hemoglobin trend, patient seems to continue to have slow bleeding. Continue PPI. GI following peripherally only.  At the family's request, I called GI to see her again today.  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  Likely metastases were found in the pancreatic tail as well as liver. GI plans to do EUS as an outpatient.  Acute Blood loss anemia  Last PRBC transfusion yesterday.  Patient received 4 units of PRBC with premedication, 1 dose of IV iron Hemoglobin trend as below.  Dropped from 8.1 yesterday to 7.5 this morning. Recent Labs    06/27/21 1024 08/08/21 1041 08/16/21 1303 08/28/21 1259 08/28/21 1300 09/26/21 1045 11/08/21 1306 12/20/21 1200 02/12/22 1655 02/12/22 1657 02/13/22 1625 02/13/22 1640 02/13/22 2106 02/14/22 0245 02/18/22 2348 02/19/22 1003 02/20/22 0100 02/21/22 0218 02/22/22 0306  HGB 11.6*   < > 11.6* 11.1*  --  11.8*  11.1* 11.1*   < >  --    < >  --  7.7*   < > 7.9* 7.6* 7.0* 8.1* 7.5*  MCV 89.0   < > 89.2 91.4  --  88.3 86.7 86.9   < >  --    < >  --  88.1   < > 89.1 88.8 92.1 92.9 91.6  VITAMINB12  --   --   --   --  927*   --  992*  --   --   --   --  2,173*  --   --   --   --   --   --   --   FOLATE  --   --   --   --  18.8  --   --   --   --   --   --   --   --   --   --   --   --   --   --   FERRITIN 25  --  150.9 104  --  46.6  --  30.9  --  768*  --  629*  --   --   --   --   --   --   --   TIBC 399  --  267 287  --   --   --   --   --   --   --  189*  --   --   --   --   --   --   --   IRON 68  --  56 66  --   --   --   --   --  21*  --  21*  --   --   --   --   --   --   --   RETICCTPCT  --   --   --   --   --   --   --   --   --   --   --   --  1.3  --   --   --   --   --   --    < > = values in this interval not displayed.   Transfusion reaction  Per previous documentations, patient had transfusion reaction 2 years ago and on 10/12 this hospitalization.  She was diaphoretic and was hypotensive.   Before last transfusion yesterday 10/18, she was premedicated with IV Benadryl and IV steroids and hence she did not have any transfusion reaction.  Moderate bilateral pleural effusions Noted on CT scan 10/14. 10/19, underwent left thoracentesis with removal of 900 mL of hazy amber fluid 10/20, requested for right thoracentesis as well.  Chronic diastolic CHF Essential hypertension Chronic pulmonary hypertension Cardiomyopathy s/p ICD Mild aortic stenosis Last echo 10/13 with EF 60 to 65%, moderate concentric LVH, grade 2 diastolic dysfunction,, mild aortic stenosis, right ventricular pressure and volume overload, severely elevated pulm artery pressure to 89 mmHg. PTA on torsemide 100 mg a.m., 80 mg p.m., metolazone 2.5 mg weekly, potassium supplement Currently diuretics on hold.  Diuretics plan as below.   AKI on CKD 3b  Baseline creatinine less than 1.5.   Noted a trend in last 3 to 4 days.  Creatinine worsened to peak at 2.24.  Improving now.  Given her severe right-sided heart failure and pulmonary hypertension, she needs some form of diuresis.  Creatinine improving.  Respiratory status stable.  Continue to hold diuretics.   Recent Labs    02/13/22 1625 02/14/22 1241 02/15/22 0130 02/16/22 0454 02/17/22 0300 02/18/22 2348 02/19/22 1003 02/20/22 0653 02/21/22 0218 02/22/22 0306  BUN 68* 49* 43* 46* 38* 53* 57* 63* 65* 57*  CREATININE 1.32* 1.42* 1.40* 1.33* 1.40* 2.03* 2.21* 2.24* 1.99* 1.64*   Hyperkalemia Potassium level elevated for the last 2 days.  Trend as below.  I will order for 1 dose of Lokelma. Recent Labs  Lab 02/16/22 0454 02/17/22 0300 02/18/22 2348 02/19/22 1003 02/20/22 0653 02/21/22 0218 02/22/22 0306  K 3.7 3.5 4.4 4.3 4.4 5.3* 5.4*  MG 2.0 2.2  --  2.4  --   --   --   PHOS  --   --   --  2.8  --   --   --    Chronic A-fib Eliquis is on hold.  Will resume once hemoglobin stabilizes.     CAD, HLD  Celiac and SMA stenosis. Patient with a paced rhythm. Patient is rate controlled. Eliquis being held. Continue Crestor 40 mg daily and Zetia 10 mg daily  Recent COVID-19 infection with possible pneumonia Completed 5-day course of IV Zosyn Respiratory status stable. Continue Robitussin as needed, albuterol as needed  Hypothyroidism Synthroid  RCC  s/p right nephrectomy   Goals of care   Code Status: Full Code.    Mobility: Encourage ambulation.  PT eval suggested home health PT.  Skin assessment:     Nutritional status:  Body mass index is 22.92 kg/m.          Diet:  Diet Order             Diet full liquid Room service appropriate? Yes; Fluid consistency: Thin  Diet effective now                   DVT prophylaxis:  SCDs Start: 02/13/22 1923   Antimicrobials: None Fluid: Not on IV fluid Consultants: GI Family Communication: Daughter at bedside  Status is: Inpatient  Continue in-hospital care because: Continue monitor for hemoglobin and renal function. Level of care: Progressive   Dispo: The patient is from: Home              Anticipated d/c is to: Pending clinical course              Patient currently is  not medically stable to d/c.   Difficult to place patient No     Infusions:   lactated ringers      Scheduled Meds:  Chlorhexidine Gluconate Cloth  6 each Topical Daily   ezetimibe  10 mg Oral Daily   guaiFENesin  1,200 mg Oral BID   lidocaine  5 mL Intradermal Once   lidocaine       pantoprazole  40 mg Oral BID   rosuvastatin  40 mg Oral Daily   sodium zirconium cyclosilicate  10 g Oral Once    PRN meds: acetaminophen **OR** acetaminophen, albuterol, guaiFENesin-dextromethorphan, lactated ringers, lidocaine, metoCLOPramide (REGLAN) injection, ondansetron (ZOFRAN) IV, mouth rinse   Antimicrobials: Anti-infectives (From admission, onward)    Start     Dose/Rate Route Frequency Ordered Stop   02/14/22 1400  piperacillin-tazobactam (ZOSYN) IVPB 3.375 g        3.375 g 12.5 mL/hr over 240 Minutes Intravenous Every 8 hours 02/14/22 1245 02/16/22 0702       Objective: Vitals:   02/22/22 0700 02/22/22 0813  BP: 110/74   Pulse: 64   Resp: 15  Temp: 98.1 F (36.7 C) 98.6 F (37 C)  SpO2: 95%     Intake/Output Summary (Last 24 hours) at 02/22/2022 1440 Last data filed at 02/22/2022 1020 Gross per 24 hour  Intake --  Output 1400 ml  Net -1400 ml   Filed Weights   02/13/22 1507  Weight: 64.4 kg   Weight change:  Body mass index is 22.92 kg/m.   Physical Exam: General exam: Pleasant, elderly Caucasian female.  Continues to look dry. Skin: No rashes, lesions or ulcers. HEENT: Atraumatic, normocephalic, no obvious bleeding Lungs: Improved air entry in left lung.  No wheezing or crackles present. CVS: Regular rate and rhythm, systolic ejection murmur noted GI/Abd soft, nontender, nondistended, bowel sound present CNS: Alert, awake, oriented to place and person Psychiatry: Mood appropriate Extremities: No pedal edema, no calf tenderness  Data Review: I have personally reviewed the laboratory data and studies available.  F/u labs ordered Unresulted Labs (From  admission, onward)     Start     Ordered   02/23/22 0500  CBC with Differential/Platelet  Tomorrow morning,   R       Question:  Specimen collection method  Answer:  Lab=Lab collect   02/22/22 1257   02/23/22 7829  Basic metabolic panel  Tomorrow morning,   R       Question:  Specimen collection method  Answer:  Lab=Lab collect   02/22/22 1257            Signed, Terrilee Croak, MD [facility] 02/22/2022

## 2022-02-23 DIAGNOSIS — K922 Gastrointestinal hemorrhage, unspecified: Secondary | ICD-10-CM | POA: Diagnosis not present

## 2022-02-23 LAB — CBC WITH DIFFERENTIAL/PLATELET
Abs Immature Granulocytes: 0.09 10*3/uL — ABNORMAL HIGH (ref 0.00–0.07)
Basophils Absolute: 0 10*3/uL (ref 0.0–0.1)
Basophils Relative: 0 %
Eosinophils Absolute: 0 10*3/uL (ref 0.0–0.5)
Eosinophils Relative: 0 %
HCT: 22.7 % — ABNORMAL LOW (ref 36.0–46.0)
Hemoglobin: 7 g/dL — ABNORMAL LOW (ref 12.0–15.0)
Immature Granulocytes: 2 %
Lymphocytes Relative: 4 %
Lymphs Abs: 0.2 10*3/uL — ABNORMAL LOW (ref 0.7–4.0)
MCH: 28.8 pg (ref 26.0–34.0)
MCHC: 30.8 g/dL (ref 30.0–36.0)
MCV: 93.4 fL (ref 80.0–100.0)
Monocytes Absolute: 0.5 10*3/uL (ref 0.1–1.0)
Monocytes Relative: 9 %
Neutro Abs: 4.4 10*3/uL (ref 1.7–7.7)
Neutrophils Relative %: 85 %
Platelets: 179 10*3/uL (ref 150–400)
RBC: 2.43 MIL/uL — ABNORMAL LOW (ref 3.87–5.11)
RDW: 18.3 % — ABNORMAL HIGH (ref 11.5–15.5)
WBC: 5.2 10*3/uL (ref 4.0–10.5)
nRBC: 1.5 % — ABNORMAL HIGH (ref 0.0–0.2)

## 2022-02-23 LAB — BASIC METABOLIC PANEL
Anion gap: 4 — ABNORMAL LOW (ref 5–15)
BUN: 45 mg/dL — ABNORMAL HIGH (ref 8–23)
CO2: 26 mmol/L (ref 22–32)
Calcium: 8.2 mg/dL — ABNORMAL LOW (ref 8.9–10.3)
Chloride: 109 mmol/L (ref 98–111)
Creatinine, Ser: 1.5 mg/dL — ABNORMAL HIGH (ref 0.44–1.00)
GFR, Estimated: 34 mL/min — ABNORMAL LOW (ref >60)
Glucose, Bld: 96 mg/dL (ref 70–99)
Potassium: 4.6 mmol/L (ref 3.5–5.1)
Sodium: 139 mmol/L (ref 135–145)

## 2022-02-23 LAB — PREPARE RBC (CROSSMATCH)

## 2022-02-23 MED ORDER — METHYLPREDNISOLONE SODIUM SUCC 125 MG IJ SOLR
125.0000 mg | Freq: Once | INTRAMUSCULAR | Status: AC
  Administered 2022-02-23: 125 mg via INTRAVENOUS
  Filled 2022-02-23: qty 2

## 2022-02-23 MED ORDER — SODIUM CHLORIDE 0.9% IV SOLUTION: Freq: Once | INTRAVENOUS | Status: AC

## 2022-02-23 MED ORDER — DIPHENHYDRAMINE HCL 50 MG/ML IJ SOLN
25.0000 mg | Freq: Once | INTRAMUSCULAR | Status: AC
  Administered 2022-02-23: 25 mg via INTRAVENOUS
  Filled 2022-02-23: qty 1

## 2022-02-23 MED ORDER — FUROSEMIDE 10 MG/ML IJ SOLN
20.0000 mg | Freq: Once | INTRAMUSCULAR | Status: AC
  Administered 2022-02-23: 20 mg via INTRAVENOUS
  Filled 2022-02-23: qty 2

## 2022-02-23 NOTE — Progress Notes (Signed)
Patient states she does not have appetite for food currently, offered to order AM meal, patient declines at this time and will inform RN when ready, wants to wait for daughter to arrive.

## 2022-02-23 NOTE — TOC Progression Note (Signed)
Transition of Care [facility]) - Progression Note    Patient Details  Name: [name] MRN: [id number] Date of Birth: [birth year]  Transition of [facility]) CM/SW Contact  Carles Collet, RN Phone Number: 02/23/2022, 1:58 PM  Clinical Narrative:     Confirmed with [facility] rep Kerry Dory that they are able to accept for [facility] services   Expected Discharge Plan: [place] Barriers to Discharge: Continued Medical Work up  Expected Discharge Plan and Services Expected Discharge Plan: [place] Choice: NA Living arrangements for the past 2 months: Single Family Home                 DME Arranged: Wheelchair manual, 3-N-1 DME Agency: [employer] Date DME Agency Contacted: 02/22/22   Representative spoke with at DME Agency: lucreatia left messager   [place] Agency: [facility] [place] Date [facility] Agency Contacted: 02/22/22 Time [place]: 1716 Representative spoke with at [place]: Left message with rep to call back   Social Determinants of Health (SDOH) Interventions    Readmission Risk Interventions    02/15/2021   11:38 AM 03/31/2020    1:20 PM  Readmission Risk Prevention Plan  Transportation Screening Complete Complete  PCP or Specialist Appt within 5-7 Days Complete   Home Care Screening Complete   Medication Review (RN CM) Complete   PCP or Specialist appointment within 3-5 days of discharge  Not Complete  PCP/Specialist Appt Not Complete comments  office closed for Thanksgiving holiday  [place] or [place]  Complete  SW Recovery Care/Counseling Consult  Complete  [place]  Not Applicable

## 2022-02-23 NOTE — Progress Notes (Signed)
PROGRESS NOTE  [name]  DOB: 13-Mar-1934  PCP: Ria Bush, MD ZOX:[id number]  DOA: 02/13/2022  LOS: 10 days  Hospital Day: 11  Brief narrative: [name] is a 88 y.o. female with PMH significant for HTN, HLD,  CAD, cardiomyopathy s/p ICD, COPD, pulm hypertension, A-fib on Eliquis, aortic stenosis, GI bleed with AVMs, CKD, hypothyroidism, OA, RCC s/p right nephrectomy, celiac and SMA stenosis. Patient was diagnosed with COVID 19 a week prior and had constant fatigue.  3 days prior she started having melena.  Blood work done by PCP showed low hemoglobin and low iron level and hence sent to ED on 10/11. Patient was initially admitted to [facility].  GI was consulted. Started on blood transfusion. 10/12, while getting blood transfusion, patient started having rigors, hypoxia raising concern of transfusion reaction.  Transfusion was stopped, she was given IV Solu-Medrol.  She also developed hypotension and fever, antibiotics was started.  She complained of chest pain, and had troponin elevated. 10/13, underwent EGD that showed 2 nonbleeding AVMs s/p cautery and enteroscopy 10/14, hemoglobin continued to drop to 7.1 requiring blood transfusion transferred to ICU in light of transfusion reaction 2 days ago. 10/14, CT angio abdomen pelvis showed a new 4.7 x 3.9 cm hypervascular tumor in the pancreatic head/uncinate process, most consistent with neuroendocrine tumor.  It is adjacent to duodenum and was likely to remain to be the source of GI bleeding.  Likely metastases were found in the pancreatic tail as well as liver. 10/15, s/p angiogram and GDA/pancreaticoduodenal artery embolization by IR 10/17, after clinical stabilization, patient was transferred out to [facility] service. For details  Subjective: Patient was seen and examined this morning.   Propped up in bed.  Does not feel good.  States he could not fall asleep last night because of cough. Breathing better after right  thoracentesis yesterday. Labs from this morning with hemoglobin further down to 7.  Daughter not at bedside this morning.  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  Hemoglobin continued to drop and hence angiogram was obtained on 10/14 which showed a new diagnosis of pancreatic mass adjacent to duodenum as the likely source of bleeding 10/14, CT abdomen showed a new 4.7 x 3.9 cm hypervascular tumor in the pancreatic head/uncinate process, adjacent to duodenum, most consistent with neuroendocrine tumor.  Likely metastases were found in the pancreatic tail as well as liver. 10/15, s/p angiogram and GDA/pancreaticoduodenal artery embolization by IR.   Based on hemoglobin trend, patient seems to continue to have slow bleeding. Continue PPI. GI follow-up on 10/22 appreciated.  No need of further inpatient intervention at this time. GI plans to do EUS as an outpatient.  Acute Blood loss anemia  Continues to have slow bleeding.  So far received received 4 units of PRBC with premedication, 1 dose of IV iron Hemoglobin trend as below, down to 7 today.  1 more unit of PRBC transfusion ordered today. Recent Labs    06/27/21 1024 08/08/21 1041 08/16/21 1303 08/28/21 1259 08/28/21 1300 09/26/21 1045 11/08/21 1306 12/20/21 1200 02/12/22 1655 02/12/22 1657 02/13/22 1625 02/13/22 1640 02/13/22 2106 02/14/22 0245 02/19/22 1003 02/20/22 0100 02/21/22 0218 02/22/22 0306 02/23/22 0446  HGB 11.6*   < > 11.6* 11.1*  --  11.8* 11.1* 11.1*   < >  --    < >  --  7.7*   < > 7.6* 7.0* 8.1* 7.5* 7.0*  MCV 89.0   < > 89.2 91.4  --  88.3 86.7 86.9   < >  --    < >  --  88.1   < > 88.8 92.1 92.9 91.6 93.4  VITAMINB12  --   --   --   --  927*  --  992*  --   --   --   --  2,173*   --   --   --   --   --   --   --   FOLATE  --   --   --   --  18.8  --   --   --   --   --   --   --   --   --   --   --   --   --   --   FERRITIN 25  --  150.9 104  --  46.6  --  30.9  --  768*  --  629*  --   --   --   --   --   --   --   TIBC 399  --  267 287  --   --   --   --   --   --   --  189*  --   --   --   --   --   --   --   IRON 68  --  56 66  --   --   --   --   --  21*  --  21*  --   --   --   --   --   --   --   RETICCTPCT  --   --   --   --   --   --   --   --   --   --   --   --  1.3  --   --   --   --   --   --    < > = values in this interval not displayed.   Transfusion reaction  Per previous documentations, patient had transfusion reaction 2 years ago and on 10/12 this hospitalization.  She was diaphoretic and was hypotensive.   Before last transfusion yesterday 10/18, she was premedicated with IV Benadryl and IV steroids and hence she did not have any transfusion reaction.  Moderate bilateral pleural effusions Noted on CT scan 10/14. 10/19, underwent left thoracentesis with removal of 900 mL of hazy amber fluid 10/20, under right thoracentesis with removal of 850 mill of fluid.  Chronic diastolic CHF Essential hypertension Chronic pulmonary hypertension Cardiomyopathy s/p ICD Mild aortic stenosis Last echo 10/13 with EF 60 to 65%, moderate concentric LVH, grade 2 diastolic dysfunction,, mild aortic stenosis, right ventricular pressure and volume overload, severely elevated pulm artery pressure to 89 mmHg. PTA on torsemide 100 mg a.m., 80 mg p.m., metolazone 2.5 mg weekly, potassium supplement Currently diuretics on hold.  Diuretics plan as below.   AKI on CKD 3b  Baseline creatinine less than 1.5.   Noted a trend in last 3 to 4 days.  Creatinine worsened to peak at 2.24.  Improving now.  Given her severe right-sided heart failure and pulmonary hypertension, she needs some form of diuresis.  Creatinine improving.  Respiratory status stable.  Continue to hold  diuretics.  While with blood transfusion today, I would  give her 1 dose of Lasix 40 mg to avoid CHF flareup. Recent Labs    02/14/22 1241 02/15/22 0130 02/16/22 0454 02/17/22 0300 02/18/22 2348 02/19/22 1003 02/20/22 0653 02/21/22 0218 02/22/22 0306 02/23/22 0446  BUN 49* 43* 46* 38* 53* 57* 63* 65* 57* 45*  CREATININE 1.42* 1.40* 1.33* 1.40* 2.03* 2.21* 2.24* 1.99* 1.64* 1.50*   Hyperkalemia Potassium level improved today with Lokelma given yesterday. Recent Labs  Lab 02/17/22 0300 02/18/22 2348 02/19/22 1003 02/20/22 0653 02/21/22 0218 02/22/22 0306 02/23/22 0446  K 3.5   < > 4.3 4.4 5.3* 5.4* 4.6  MG 2.2  --  2.4  --   --   --   --   PHOS  --   --  2.8  --   --   --   --    < > = values in this interval not displayed.   Chronic A-fib Eliquis is on hold.  Will resume once hemoglobin stabilizes.     CAD, HLD  Celiac and SMA stenosis. Patient with a paced rhythm. Patient is rate controlled. Eliquis being held. Continue Crestor 40 mg daily and Zetia 10 mg daily  Recent COVID-19 infection with possible pneumonia Completed 5-day course of IV Zosyn Respiratory status stable. Continue Robitussin as needed, albuterol as needed  Hypothyroidism Synthroid  RCC  s/p right nephrectomy   Goals of care   Code Status: Full Code.    Mobility: Encourage ambulation.  PT eval suggested home health PT.  Skin assessment:     Nutritional status:  Body mass index is 22.92 kg/m.          Diet:  Diet Order             Diet full liquid Room service appropriate? Yes; Fluid consistency: Thin  Diet effective now                   DVT prophylaxis:  SCDs Start: 02/13/22 1923   Antimicrobials: None Fluid: Not on IV fluid Consultants: GI Family Communication: Daughter not at bedside today  Status is: Inpatient  Continue in-hospital care because: Continue monitor for hemoglobin and renal function. Level of care: Progressive   Dispo: The patient is from:  Home              Anticipated d/c is to: Pending clinical course              Patient currently is not medically stable to d/c.   Difficult to place patient No     Infusions:   lactated ringers      Scheduled Meds:  sodium chloride   Intravenous Once   Chlorhexidine Gluconate Cloth  6 each Topical Daily   ezetimibe  10 mg Oral Daily   guaiFENesin  1,200 mg Oral BID   lidocaine  5 mL Intradermal Once   pantoprazole  40 mg Oral BID   rosuvastatin  40 mg Oral Daily    PRN meds: acetaminophen **OR** acetaminophen, albuterol, guaiFENesin-dextromethorphan, lactated ringers, metoCLOPramide (REGLAN) injection, ondansetron (ZOFRAN) IV, mouth rinse   Antimicrobials: Anti-infectives (From admission, onward)    Start     Dose/Rate Route Frequency Ordered Stop   02/14/22 1400  piperacillin-tazobactam (ZOSYN) IVPB 3.375 g        3.375 g 12.5 mL/hr over 240 Minutes Intravenous Every 8 hours 02/14/22 1245 02/16/22 0702       Objective: Vitals:   02/23/22 1130 02/23/22 1135  BP: (!) 147/44 (!) 139/47  Pulse: 65 65  Resp: 17 17  Temp: 98.1 F (36.7 C) 98.1 F (36.7 C)  SpO2: 100% 100%    Intake/Output Summary (Last 24 hours) at 02/23/2022 1136 Last data filed at 02/23/2022 1130 Gross per 24 hour  Intake --  Output 900 ml  Net -900 ml   Filed Weights   02/13/22 1507  Weight: 64.4 kg   Weight change:  Body mass index is 22.92 kg/m.   Physical Exam: General exam: Pleasant, elderly Caucasian female.  Uncomfortable.  Anxious look Skin: No rashes, lesions or ulcers. HEENT: Atraumatic, normocephalic, no obvious bleeding Lungs: Improved air entry in left lung.  No wheezing or crackles present. CVS: Regular rate and rhythm, systolic ejection murmur noted GI/Abd soft, nontender, nondistended, bowel sound present CNS: Alert, awake, oriented to place and person Psychiatry: Anxious Extremities: No pedal edema, no calf tenderness  Data Review: I have personally reviewed the  laboratory data and studies available.  F/u labs ordered Unresulted Labs (From admission, onward)     Start     Ordered   02/24/22 6967  Basic metabolic panel  Daily at 5am,   R     Question:  Specimen collection method  Answer:  Lab=Lab collect   02/23/22 1136   02/24/22 0500  CBC with Differential/Platelet  Daily at 5am,   R     Question:  Specimen collection method  Answer:  Lab=Lab collect   02/23/22 1136            Signed, Terrilee Croak, MD [facility] 02/23/2022

## 2022-02-23 NOTE — Progress Notes (Signed)
Blood transfusion reached patient via IV at 1125

## 2022-02-23 NOTE — Progress Notes (Signed)
Patient wishes to wait to administer blood transfusion until her daughter arrives to bedside; patient unsure of daughter's ETA; will inform MD

## 2022-02-24 DIAGNOSIS — K922 Gastrointestinal hemorrhage, unspecified: Secondary | ICD-10-CM | POA: Diagnosis not present

## 2022-02-24 LAB — TYPE AND SCREEN
ABO/RH(D): A NEG
Antibody Screen: POSITIVE
Donor AG Type: NEGATIVE
Donor AG Type: NEGATIVE
Donor AG Type: NEGATIVE
Unit division: 0
Unit division: 0
Unit division: 0

## 2022-02-24 LAB — CBC WITH DIFFERENTIAL/PLATELET
Abs Immature Granulocytes: 0.1 10*3/uL — ABNORMAL HIGH (ref 0.00–0.07)
Basophils Absolute: 0 10*3/uL (ref 0.0–0.1)
Basophils Relative: 0 %
Eosinophils Absolute: 0 10*3/uL (ref 0.0–0.5)
Eosinophils Relative: 0 %
HCT: 26.2 % — ABNORMAL LOW (ref 36.0–46.0)
Hemoglobin: 8.5 g/dL — ABNORMAL LOW (ref 12.0–15.0)
Immature Granulocytes: 3 %
Lymphocytes Relative: 4 %
Lymphs Abs: 0.2 10*3/uL — ABNORMAL LOW (ref 0.7–4.0)
MCH: 30 pg (ref 26.0–34.0)
MCHC: 32.4 g/dL (ref 30.0–36.0)
MCV: 92.6 fL (ref 80.0–100.0)
Monocytes Absolute: 0.2 10*3/uL (ref 0.1–1.0)
Monocytes Relative: 4 %
Neutro Abs: 3.4 10*3/uL (ref 1.7–7.7)
Neutrophils Relative %: 89 %
Platelets: 172 10*3/uL (ref 150–400)
RBC: 2.83 MIL/uL — ABNORMAL LOW (ref 3.87–5.11)
RDW: 17 % — ABNORMAL HIGH (ref 11.5–15.5)
WBC: 3.9 10*3/uL — ABNORMAL LOW (ref 4.0–10.5)
nRBC: 0.8 % — ABNORMAL HIGH (ref 0.0–0.2)

## 2022-02-24 LAB — BASIC METABOLIC PANEL
Anion gap: 7 (ref 5–15)
BUN: 36 mg/dL — ABNORMAL HIGH (ref 8–23)
CO2: 27 mmol/L (ref 22–32)
Calcium: 8.4 mg/dL — ABNORMAL LOW (ref 8.9–10.3)
Chloride: 106 mmol/L (ref 98–111)
Creatinine, Ser: 1.35 mg/dL — ABNORMAL HIGH (ref 0.44–1.00)
GFR, Estimated: 39 mL/min — ABNORMAL LOW (ref >60)
Glucose, Bld: 145 mg/dL — ABNORMAL HIGH (ref 70–99)
Potassium: 5.2 mmol/L — ABNORMAL HIGH (ref 3.5–5.1)
Sodium: 140 mmol/L (ref 135–145)

## 2022-02-24 LAB — BPAM RBC
Blood Product Expiration Date: 202311142359
Blood Product Expiration Date: 202311172359
Blood Product Expiration Date: 202311182359
ISSUE DATE / TIME: 202310182040
ISSUE DATE / TIME: 202310211113
Unit Type and Rh: 600
Unit Type and Rh: 600
Unit Type and Rh: 9500

## 2022-02-24 MED ORDER — SODIUM ZIRCONIUM CYCLOSILICATE 5 G PO PACK
5.0000 g | PACK | Freq: Once | ORAL | Status: AC
  Administered 2022-02-24: 5 g via ORAL
  Filled 2022-02-24: qty 1

## 2022-02-24 MED ORDER — SENNOSIDES-DOCUSATE SODIUM 8.6-50 MG PO TABS
1.0000 | ORAL_TABLET | Freq: Two times a day (BID) | ORAL | Status: DC
  Filled 2022-02-24 (×3): qty 1

## 2022-02-24 MED ORDER — SODIUM CHLORIDE 0.9 % IV SOLN
510.0000 mg | Freq: Once | INTRAVENOUS | Status: AC
  Administered 2022-02-24: 510 mg via INTRAVENOUS
  Filled 2022-02-24: qty 17

## 2022-02-24 NOTE — Progress Notes (Signed)
Mobility Specialist Progress Note:   02/24/22 1030  Mobility  Activity Dangled on edge of bed  Level of Assistance Standby assist, set-up cues, supervision of patient - no hands on  Assistive Device None  Activity Response Tolerated well  Mobility Referral Yes  $Mobility charge 1 Mobility   Pt declining any OOB mobility at this time d/t needing a bath. Agreeable to sit EOB to brush teeth. No physical assistance required for bed mobility. Pt left with all needs met, NT in to give bath. Will f/u for hopeful OOB mobility.   Nelta Numbers Acute Rehab Secure Chat or Office Phone: [phone]

## 2022-02-24 NOTE — Progress Notes (Signed)
PROGRESS NOTE  [name]  DOB: 1933-11-01  PCP: Ria Bush, MD PPI:[id number]  DOA: 02/13/2022  LOS: 34 days  Hospital Day: 12  Brief narrative: [name] is a 88 y.o. female with PMH significant for HTN, HLD,  CAD, cardiomyopathy s/p ICD, COPD, pulm hypertension, A-fib on Eliquis, aortic stenosis, GI bleed with AVMs, CKD, hypothyroidism, OA, RCC s/p right nephrectomy, celiac and SMA stenosis. Patient was diagnosed with COVID 19 a week prior and had constant fatigue.  3 days prior she started having melena.  Blood work done by PCP showed low hemoglobin and low iron level and hence sent to ED on 10/11. Patient was initially admitted to [facility].  GI was consulted. Started on blood transfusion. 10/12, while getting blood transfusion, patient started having rigors, hypoxia raising concern of transfusion reaction.  Transfusion was stopped, she was given IV Solu-Medrol.  She also developed hypotension and fever, antibiotics was started.  She complained of chest pain, and had troponin elevated. 10/13, underwent EGD that showed 2 nonbleeding AVMs s/p cautery and enteroscopy 10/14, hemoglobin continued to drop to 7.1 requiring blood transfusion transferred to ICU in light of transfusion reaction 2 days ago. 10/14, CT angio abdomen pelvis showed a new 4.7 x 3.9 cm hypervascular tumor in the pancreatic head/uncinate process, most consistent with neuroendocrine tumor.  It is adjacent to duodenum and was likely to remain to be the source of GI bleeding.  Likely metastases were found in the pancreatic tail as well as liver. 10/15, s/p angiogram and GDA/pancreaticoduodenal artery embolization by IR 10/17, after clinical stabilization, patient was transferred out to [facility] service. For details  Subjective: Patient was seen and examined this morning.   Sitting up in recliner.  On low-flow oxygen.  Feels better.  Able to take deeper breath today.  No BM in several days but had a small one  yesterday and she believes it was melanotic.   Daughter at bedside. Labs this morning with hemoglobin 8.5.  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  Hemoglobin continued to drop and hence angiogram was obtained on 10/14 which showed a new diagnosis of pancreatic mass adjacent to duodenum as the likely source of bleeding 10/14, CT abdomen showed a new 4.7 x 3.9 cm hypervascular tumor in the pancreatic head/uncinate process, adjacent to duodenum, most consistent with neuroendocrine tumor.  Likely metastases were found in the pancreatic tail as well as liver. 10/15, s/p angiogram and GDA/pancreaticoduodenal artery embolization by IR.   Based on hemoglobin trend, patient seems to continue to have slow bleeding. Continue PPI. GI follow-up on 10/22 appreciated.  No need of further inpatient intervention at this time. GI plans to do EUS as an outpatient.  Acute Blood loss anemia  Continues to have slow bleeding.  So far received received 5 units of PRBC with premedication, 1 dose of IV iron.  Hemoglobin better at 8.5 today after transfusion yesterday.  Give 1 more dose of IV iron today. Recent Labs    06/27/21 1024 08/08/21 1041 08/16/21 1303 08/28/21 1259 08/28/21 1300 09/26/21 1045 11/08/21 1306 12/20/21 1200 02/12/22 1655 02/12/22 1657 02/13/22 1625 02/13/22 1640 02/13/22 2106 02/14/22 0245 02/20/22 0100 02/21/22 0218 02/22/22 0306 02/23/22 0446 02/24/22 0214  HGB 11.6*   < > 11.6* 11.1*  --  11.8* 11.1* 11.1*   < >  --    < >  --  7.7*   < > 7.0* 8.1* 7.5* 7.0* 8.5*  MCV 89.0   < > 89.2 91.4  --  88.3 86.7 86.9   < >  --    < >  --  88.1   < > 92.1 92.9 91.6 93.4 92.6  VITAMINB12  --   --   --   --  927*  --  992*  --   --   --   --  2,173*  --   --    --   --   --   --   --   FOLATE  --   --   --   --  18.8  --   --   --   --   --   --   --   --   --   --   --   --   --   --   FERRITIN 25  --  150.9 104  --  46.6  --  30.9  --  768*  --  629*  --   --   --   --   --   --   --   TIBC 399  --  267 287  --   --   --   --   --   --   --  189*  --   --   --   --   --   --   --   IRON 68  --  56 66  --   --   --   --   --  21*  --  21*  --   --   --   --   --   --   --   RETICCTPCT  --   --   --   --   --   --   --   --   --   --   --   --  1.3  --   --   --   --   --   --    < > = values in this interval not displayed.   Transfusion reaction  Per previous documentations, patient had transfusion reaction 2 years ago and on 10/12 this hospitalization.  She was diaphoretic and was hypotensive.   Before last transfusion yesterday 10/18, she was premedicated with IV Benadryl and IV steroids and hence she did not have any transfusion reaction.  Moderate bilateral pleural effusions Noted on CT scan 10/14. 10/19, underwent left thoracentesis with removal of 900 mL of hazy amber fluid 10/20, under right thoracentesis with removal of 850 mill of fluid. Status improving.  Able to take deeper breaths.  Mucinex for cough.  Chronic diastolic CHF Essential hypertension Chronic pulmonary hypertension Cardiomyopathy s/p ICD Mild aortic stenosis Last echo 10/13 with EF 60 to 65%, moderate concentric LVH, grade 2 diastolic dysfunction,, mild aortic stenosis, right ventricular pressure and volume overload, severely elevated pulm artery pressure to 89 mmHg. PTA on torsemide 100 mg a.m., 80 mg p.m., metolazone 2.5 mg weekly, potassium supplement Currently diuretics on hold.  Diuretics plan as below.   AKI on CKD 3b  Baseline creatinine less than 1.5.   Noted a trend in last 3 to 4 days.  Creatinine worsened to peak at 2.24.  Improving now.  Given her severe right-sided heart failure and pulmonary hypertension, she needs some form of diuresis.  Creatinine  improving.  Respiratory status stable.  Given 1 dose of IV Lasix yesterday with blood transfusion.   Recent Labs    02/15/22 0130 02/16/22 0454 02/17/22 0300 02/18/22 2348 02/19/22 1003 02/20/22 0653 02/21/22 0218 02/22/22 0306 02/23/22 0446 02/24/22 0214  BUN 43* 46* 38* 53* 57* 63* 65* 57* 45* 36*  CREATININE 1.40* 1.33* 1.40* 2.03* 2.21* 2.24* 1.99* 1.64* 1.50* 1.35*   Hyperkalemia Potassium level up again at 5.2 today.  Repeat Lokelma today.  Not on scheduled potassium supplementation. Recent Labs  Lab 02/19/22 1003 02/20/22 0653 02/21/22 0218 02/22/22 0306 02/23/22 0446 02/24/22 0214  K 4.3 4.4 5.3* 5.4* 4.6 5.2*  MG 2.4  --   --   --   --   --   PHOS 2.8  --   --   --   --   --    Chronic A-fib Eliquis is on hold.  Will resume once hemoglobin stabilizes.     CAD, HLD  Celiac and SMA stenosis. Patient with a paced rhythm. Patient is rate controlled. Eliquis being held. Continue Crestor 40 mg daily and Zetia 10 mg daily  Recent COVID-19 infection with possible pneumonia Completed 5-day course of IV Zosyn Respiratory status stable. Continue Robitussin as needed, albuterol as needed  Hypothyroidism Synthroid  RCC  s/p right nephrectomy   Goals of care   Code Status: Full Code.    Mobility: Encourage ambulation.  PT eval suggested home health PT.  Skin assessment:     Nutritional status:  Body mass index is 22.92 kg/m.          Diet:  Diet Order             Diet regular Room service appropriate? Yes; Fluid consistency: Thin  Diet effective now                   DVT prophylaxis:  SCDs Start: 02/13/22 1923   Antimicrobials: None Fluid: Not on IV fluid Consultants: GI Family Communication: Daughter at bedside today  Status is: Inpatient  Continue in-hospital care because: Continue monitor for hemoglobin and renal function. Level of care: Progressive   Dispo: The patient is from: Home              Anticipated d/c is to:  Pending clinical course              Patient currently is not medically stable to d/c.   Difficult to place patient No     Infusions:   ferumoxytol (FERAHEME) 510 mg in sodium chloride 0.9 % 100 mL IVPB     lactated ringers      Scheduled Meds:  Chlorhexidine Gluconate Cloth  6 each Topical Daily   ezetimibe  10 mg Oral Daily   guaiFENesin  1,200 mg Oral BID   lidocaine  5 mL Intradermal Once   pantoprazole  40 mg Oral BID   rosuvastatin  40 mg Oral Daily   senna-docusate  1 tablet Oral BID    PRN meds: acetaminophen **OR** acetaminophen, albuterol, guaiFENesin-dextromethorphan, lactated ringers, metoCLOPramide (REGLAN) injection, ondansetron (ZOFRAN) IV, mouth rinse   Antimicrobials: Anti-infectives (From admission, onward)    Start     Dose/Rate Route Frequency Ordered Stop   02/14/22 1400  piperacillin-tazobactam (ZOSYN) IVPB 3.375 g        3.375 g 12.5 mL/hr over 240 Minutes Intravenous Every 8 hours 02/14/22 1245 02/16/22 0702       Objective: Vitals:   02/24/22 0729 02/24/22 0845  BP: 138/62 (!) 124/46  Pulse: 60  Resp: 18   Temp: 98.2 F (36.8 C) 98.3 F (36.8 C)  SpO2: 93%     Intake/Output Summary (Last 24 hours) at 02/24/2022 1209 Last data filed at 02/23/2022 1515 Gross per 24 hour  Intake 437.08 ml  Output 700 ml  Net -262.92 ml   Filed Weights   02/13/22 1507  Weight: 64.4 kg   Weight change:  Body mass index is 22.92 kg/m.   Physical Exam: General exam: Pleasant, elderly Caucasian female.  Uncomfortable.  Anxious look Skin: No rashes, lesions or ulcers. HEENT: Atraumatic, normocephalic, no obvious bleeding Lungs: Improved air entry in left lung.  Able to take deeper breaths today. CVS: Regular rate and rhythm, systolic ejection murmur noted GI/Abd soft, nontender, nondistended, bowel sound present CNS: Alert, awake, oriented to place and person Psychiatry: Remains anxious Extremities: No pedal edema, no calf tenderness  Data  Review: I have personally reviewed the laboratory data and studies available.  F/u labs ordered Unresulted Labs (From admission, onward)     Start     Ordered   02/24/22 9024  Basic metabolic panel  Daily at 5am,   R     Question:  Specimen collection method  Answer:  Lab=Lab collect   02/23/22 1136   02/24/22 0500  CBC with Differential/Platelet  Daily at 5am,   R     Question:  Specimen collection method  Answer:  Lab=Lab collect   02/23/22 1136            Signed, Terrilee Croak, MD [facility] 02/24/2022

## 2022-02-25 ENCOUNTER — Encounter (HOSPITAL_COMMUNITY): Payer: Self-pay | Admitting: Gastroenterology

## 2022-02-25 ENCOUNTER — Ambulatory Visit: Payer: PPO | Admitting: Dermatology

## 2022-02-25 DIAGNOSIS — K8689 Other specified diseases of pancreas: Secondary | ICD-10-CM | POA: Diagnosis not present

## 2022-02-25 DIAGNOSIS — Z7189 Other specified counseling: Secondary | ICD-10-CM

## 2022-02-25 DIAGNOSIS — Z515 Encounter for palliative care: Secondary | ICD-10-CM

## 2022-02-25 DIAGNOSIS — J439 Emphysema, unspecified: Secondary | ICD-10-CM | POA: Diagnosis not present

## 2022-02-25 DIAGNOSIS — K922 Gastrointestinal hemorrhage, unspecified: Secondary | ICD-10-CM | POA: Diagnosis not present

## 2022-02-25 LAB — CBC WITH DIFFERENTIAL/PLATELET
Abs Immature Granulocytes: 0.08 10*3/uL — ABNORMAL HIGH (ref 0.00–0.07)
Basophils Absolute: 0 10*3/uL (ref 0.0–0.1)
Basophils Relative: 0 %
Eosinophils Absolute: 0 10*3/uL (ref 0.0–0.5)
Eosinophils Relative: 0 %
HCT: 25.3 % — ABNORMAL LOW (ref 36.0–46.0)
Hemoglobin: 8.1 g/dL — ABNORMAL LOW (ref 12.0–15.0)
Immature Granulocytes: 2 %
Lymphocytes Relative: 5 %
Lymphs Abs: 0.3 10*3/uL — ABNORMAL LOW (ref 0.7–4.0)
MCH: 30.2 pg (ref 26.0–34.0)
MCHC: 32 g/dL (ref 30.0–36.0)
MCV: 94.4 fL (ref 80.0–100.0)
Monocytes Absolute: 0.4 10*3/uL (ref 0.1–1.0)
Monocytes Relative: 9 %
Neutro Abs: 4 10*3/uL (ref 1.7–7.7)
Neutrophils Relative %: 84 %
Platelets: 151 10*3/uL (ref 150–400)
RBC: 2.68 MIL/uL — ABNORMAL LOW (ref 3.87–5.11)
RDW: 17.7 % — ABNORMAL HIGH (ref 11.5–15.5)
WBC: 4.7 10*3/uL (ref 4.0–10.5)
nRBC: 0.4 % — ABNORMAL HIGH (ref 0.0–0.2)

## 2022-02-25 LAB — BASIC METABOLIC PANEL
Anion gap: 10 (ref 5–15)
BUN: 29 mg/dL — ABNORMAL HIGH (ref 8–23)
CO2: 25 mmol/L (ref 22–32)
Calcium: 8.3 mg/dL — ABNORMAL LOW (ref 8.9–10.3)
Chloride: 106 mmol/L (ref 98–111)
Creatinine, Ser: 1.23 mg/dL — ABNORMAL HIGH (ref 0.44–1.00)
GFR, Estimated: 43 mL/min — ABNORMAL LOW (ref >60)
Glucose, Bld: 88 mg/dL (ref 70–99)
Potassium: 4.3 mmol/L (ref 3.5–5.1)
Sodium: 141 mmol/L (ref 135–145)

## 2022-02-25 NOTE — Progress Notes (Signed)
Mobility Specialist Progress Note:   02/25/22 0945  Mobility  Activity Dangled on edge of bed  Level of Assistance Standby assist, set-up cues, supervision of patient - no hands on  Assistive Device None  Activity Response Tolerated fair  Mobility Referral Yes  $Mobility charge 1 Mobility   Pt agreeable to mobility session, however c/o lethargy this am. Pt able to sit EOB, then felt unwell. Requesting bath before any OOB mobility. NT notified.  Nelta Numbers Acute Rehab Secure Chat or Office Phone: [phone]

## 2022-02-25 NOTE — Consult Note (Signed)
Consultation Note Date: 02/25/2022   Patient Name: [name]  DOB: [birth year]  MRN: [id number]  Age / Sex: 88 y.o., female  PCP: Ria Bush, MD Referring Physician: Terrilee Croak, MD  Reason for Consultation: Establishing goals of care and Psychosocial/spiritual support  HPI/Patient Profile: 88 y.o. female  admitted on 02/13/2022 with PMH significant for HTN, HLD,  CAD, cardiomyopathy s/p ICD, COPD, pulm hypertension, A-fib on Eliquis, aortic stenosis, GI bleed with AVMs, CKD, hypothyroidism, OA, RCC s/p right nephrectomy, celiac and SMA stenosis.  Patient was diagnosed with COVID 19 a week prior and had constant fatigue.  3 days prior she started having melena.  Blood work done by PCP showed low hemoglobin and low iron level and hence sent to ED on 10/11.GI was consulted. Started on blood transfusion.  10/12, while getting blood transfusion, patient started having rigors, hypoxia raising concern of transfusion reaction.  Transfusion was stopped, she was given IV Solu-Medrol.  She also developed hypotension and fever, antibiotics was started.  She complained of chest pain, and had troponin elevated.  10/13, underwent EGD that showed 2 nonbleeding AVMs s/p cautery and enteroscopy 10/14, hemoglobin continued to drop to 7.1 requiring blood transfusion transferred to ICU in light of transfusion reaction 2 days ago.  10/14, CT angio abdomen pelvis showed a new 4.7 x 3.9 cm hypervascular tumor in the pancreatic head/uncinate process, most consistent with neuroendocrine tumor.  It is adjacent to duodenum and was likely to remain to be the source of GI bleeding.  Likely metastases were found in the pancreatic tail as well as liver.  10/15, s/p angiogram and GDA/pancreaticoduodenal artery embolization by IR  10/17, after clinical stabilization, patient was transferred out to [facility] service.  10/20 right  ultrasound-guided thoracentesis for 850 mL  Patient and family face treatment option decisions, advanced directive decisions and anticipatory care needs.    Clinical Assessment and Goals of Care:  This NP Wadie Lessen reviewed medical records, received report from team, assessed the patient and then meet at the patient's bedside [place] with her daughter [name] to discuss diagnosis, prognosis, GOC, EOL wishes disposition and options.   Concept of Palliative Care was introduced as specialized medical care for people and their families living with serious illness.  If focuses on providing relief from the symptoms and stress of a serious illness.  The goal is to improve quality of life for both the patient and the family.  Created space and opportunity for patient  and family to explore thoughts and feelings regarding current medical situation.  Outside the room the daughter is reluctant to have "too much conversation today", she expresses her concern for her mother's psychological wellbeing receiving a lot of complex, difficult medical information in the past several days.  Daughter verbalizes anxiety regarding her mother's newly diagnosed pancreatic mass.  "We cannot make any decisions until we have all the information".  She wants to push forward with further outpatient work-up.  Patient herself is much more open to conversation, and expresses pragmatic approach to her  current situation and the likely limited viable options to prolong quality of life.  Patient speaks to her knowing that her body is failing to thrive; she speaks to an understanding of her failing heart, kidneys, lungs and now new cancer diagnosis.     Patient lives at home with her nephew/Tyler.  She is supported in her home by her daughter [name].  Patient tells me that she loves being at home, enjoys cooking.  Her church and faith have always been foundational to her.  Patient verbalizes desire to receive communion from the [facility].  I will consult spiritual care.     A  discussion was had today regarding advanced directives.  Concepts specific to code status, artifical feeding and hydration, continued IV antibiotics and rehospitalization was had.    The difference between a aggressive medical intervention path  and a palliative comfort care path for this patient at this time was had.  Education offered on hospice benefit; philosophy and eligibility.  Patient verbalizes an understanding of hospice from previous experiences with family and friends.   Decision made today to continue with current supportive medical measures hoping for improvement and discharged home.  Plan is to follow-up outpatient EUS scan for diagnostic purposes.    Questions and concerns addressed.  Patient  encouraged to call with questions or concerns.     PMT will continue to support holistically.      NEXT OF KIN    SUMMARY OF RECOMMENDATIONS   Code Status/Advance Care Planning: DNR-documented today--patient clearly verbalizes her wishes and her daughter [name] supports that decision today.    Palliative Prophylaxis:  Aspiration, Delirium Protocol, Frequent Pain Assessment, and Oral Care  Additional Recommendations (Limitations, Scope, Preferences): Full Scope Treatment  Psycho-social/Spiritual:  Desire for further Chaplaincy support:yes       Additional Recommendations: Education on Hospice  Prognosis:  Will depend decisions regarding treatment options and life prolonging measures  Discharge Planning: To Be Determined      Primary Diagnoses: Present on Admission:  Hypothyroidism  Mixed hyperlipidemia  Permanent atrial fibrillation  COPD (chronic obstructive pulmonary disease) (HCC)  Chronic kidney disease, stage 3b (HCC)  Chronic diastolic CHF (congestive heart failure) (HCC)  Hypertension  ICD (implantable cardioverter-defibrillator) in place  COVID-19 virus infection  Hypokalemia  Symptomatic anemia  GI  bleed   I have reviewed the medical record, interviewed the patient and family, and examined the patient. The following aspects are pertinent.  Past Medical History:  Diagnosis Date   Actinic keratosis 01/17/2015   R forearm   Adjustment disorder with anxiety    Anemia    Anginal pain ([place])    Arthritis    "some in my hands" (11/11/2012)   Atrial fibrillation (HCC)    Atypical mole 03/25/2018   L forearm - severe   Automatic implantable cardioverter-defibrillator in situ    Avascular necrosis of hip ([place]) 05/03/2011   Blood in stool    Carotid artery stenosis 09/2007   a. 09/2007: 60-79% bilateral (stable); b. 10/2008: 40-59% R 60-79%    Chest pain, unspecified    Chronic airway obstruction, not elsewhere classified    Chronic bronchitis ([place])    "get it some; not q year" (11/11/2012)   Chronic diastolic CHF (congestive heart failure) ([place]) 06/04/2013   Chronic kidney disease, unspecified    Coronary artery disease    non-obstructive by 2006 cath   Displaced fracture of left femoral neck ([place]) 10/09/2018   Frequent UTI    "get them  a couple times/yr" (11/11/2012)   GI bleed 03/28/2020   Hemorrhage of rectum and anus    High cholesterol    History of blood transfusion 04/2011   "after hip OR" (11/11/2012)   Hx of renal cell cancer    Hypertension 05/20/2011   Hypertr obst cardiomyop    Hypotension, unspecified    cardiac cath 2006..nonobstructive CAD 30-40s lesions.Marland KitchenETT 1/09 nondiagnostic due to poor HR response..Right Renal Cancer 2003   Long term (current) use of anticoagulants    Malignant neoplasm of kidney, except pelvis    Osteoarthritis of right hip    Other and unspecified coagulation defects    PONV (postoperative nausea and vomiting)    Presence of permanent cardiac pacemaker    RECTAL BLEEDING 10/13/2009   Qualifier: Diagnosis of  By: Chester Holstein NP, Paula     Renal cancer ([place]) 06/2001   Right   Secondary cardiomyopathy, unspecified    Sinus bradycardia     Squamous cell carcinoma of skin 01/17/2015   R lat wrist   Squamous cell carcinoma of skin 01/29/2018   R post upper leg - superficially invasive   Squamous cell carcinoma of skin 03/25/2018   L lat foot   Squamous cell carcinoma of skin 11/09/2020   left lat foot - EDC 01/01/21, recurrent 01/31/21 - MOHs 02/22/21   Squamous cell carcinoma of skin 12/19/2021   Right Posterior Medial Thigh, EDC   Squamous cell carcinoma of skin 12/19/2021   SCC IS, L lat heel, EDC 02/04/2022   Squamous cell carcinoma of skin 01/21/2022   L forearm, EDC 02/04/2022   Squamous cell carcinoma of skin 01/21/2022   R lower leg below knee, EDC 02/04/2022   Squamous cell carcinoma of skin 01/21/2022   SCCIS, R post heel, EDC 02/04/2022   Urge incontinence    Social History   Socioeconomic History   Marital status: Widowed    Spouse name: Not on file   Number of children: 2   Years of education: high school   Highest education level: Not on file  Occupational History   Occupation: Retired    Fish farm manager: RETIRED  Tobacco Use   Smoking status: Former    Packs/day: 0.50    Years: 40.00    Total pack years: 20.00    Types: Cigarettes    Quit date: 05/06/2001    Years since quitting: 20.8   Smokeless tobacco: Never  Vaping Use   Vaping Use: Never used  Substance and Sexual Activity   Alcohol use: No    Alcohol/week: 0.0 standard drinks of alcohol   Drug use: No   Sexual activity: Not Currently  Other Topics Concern   Not on file  Social History Narrative   Would desire CPR   07/11/20   From: the area   Living: with great-grandson - [name] (2003)   Work: retired - clerical      Family: 2 children - [name] and Hillman - 2 grand chlidren - 5 great grand children      Enjoys: spending time with friends - watch move, eat out, spend time      Exercise: walking around the house   Diet: not good, limits red meat, limits salt, low appetite      Safety   Seat belts: Yes    Guns: Yes  and secure   Safe  in relationships: Yes    Social Determinants of Health   Financial Resource Strain: Low Risk  (09/24/2021)   Overall Emergency planning/management officer Strain (  CARDIA)    Difficulty of Paying Living Expenses: Not hard at all  Food Insecurity: No Food Insecurity (02/14/2022)   Hunger Vital Sign    Worried About Running Out of Food in the Last Year: Never true    Ran Out of Food in the Last Year: Never true  Transportation Needs: No Transportation Needs (02/14/2022)   PRAPARE - Hydrologist (Medical): No    Lack of Transportation (Non-Medical): No  Physical Activity: Inactive (09/24/2021)   Exercise Vital Sign    Days of Exercise per Week: 0 days    Minutes of Exercise per Session: 0 min  Stress: No Stress Concern Present (09/24/2021)   [place]    Feeling of Stress : Not at all  Social Connections: Not on file   Family History  Problem Relation Age of Onset   Heart failure Mother    Early death Father        car accident   Breast cancer Maternal Aunt 82   Breast cancer Cousin    Breast cancer Other    Colon cancer Neg Hx    Esophageal cancer Neg Hx    Liver disease Neg Hx    Pancreatic cancer Neg Hx    Rectal cancer Neg Hx    Scheduled Meds:  Chlorhexidine Gluconate Cloth  6 each Topical Daily   ezetimibe  10 mg Oral Daily   guaiFENesin  1,200 mg Oral BID   lidocaine  5 mL Intradermal Once   pantoprazole  40 mg Oral BID   rosuvastatin  40 mg Oral Daily   senna-docusate  1 tablet Oral BID   Continuous Infusions:  lactated ringers     PRN Meds:.acetaminophen **OR** acetaminophen, albuterol, guaiFENesin-dextromethorphan, lactated ringers, metoCLOPramide (REGLAN) injection, ondansetron (ZOFRAN) IV, mouth rinse Medications Prior to Admission:  Prior to Admission medications   Medication Sig Start Date End Date Taking? Authorizing Provider  acetaminophen (TYLENOL) 500 MG tablet Take 500-1,000  mg by mouth every 8 (eight) hours as needed for mild pain or headache.   Yes [provider]  albuterol (VENTOLIN HFA) 108 (90 Base) MCG/ACT inhaler Inhale 2 puffs into the lungs every 6 (six) hours as needed for wheezing or shortness of breath. 02/08/22  Yes Waunita Schooner, MD  Cyanocobalamin (B-12 PO) Take 1 tablet by mouth daily.    Yes [provider]  ELIQUIS 2.5 MG TABS tablet TAKE 1 TABLET BY MOUTH TWICE A DAY Patient taking differently: Take 2.5 mg by mouth 2 (two) times daily. 06/11/21  Yes Larey Dresser, MD  fluticasone [facility]) 50 MCG/ACT nasal spray Place 2 sprays into both nostrils daily as needed for allergies or rhinitis. 03/08/21  Yes Waunita Schooner, MD  metolazone (ZAROXOLYN) 2.5 MG tablet Take 1 tablet (2.5 mg total) by mouth as directed. Take every other Thursday with your morning dose of Torsemide Patient taking differently: Take 2.5 mg by mouth once a week. Take Thursday with your morning dose of Torsemide 08/08/21  Yes Mil[place], Sheridan, FNP  potassium chloride SA (KLOR-CON M) 20 MEQ tablet TAKE 2 TABLETS (40 MEQ) BY MOUTH 2 TIMESDAILY. TAKE 1 EXTRA TABLET ON THE DAYS YOU TAKE METOLAZONE. Patient taking differently: Take 40-60 mEq by mouth 2 (two) times daily. Take 1 extra tablet (20 mEq) on the days you take metolazone. 10/04/21  Yes Larey Dresser, MD  vitamin C (ASCORBIC ACID) 250 MG tablet Take 500 mg by mouth daily.  Yes [provider]  VITAMIN D PO Take 4,000 Units by mouth daily.   Yes [provider]  clobetasol cream (TEMOVATE) 0.05 % Apply to affected area left foot twice a day until improved. Avoid face, groin, axilla. Patient not taking: Reported on 02/13/2022 12/19/21   Brendolyn Patty, MD  octreotide (SANDOSTATIN LAR) 10 MG injection Inject 10 mg into the muscle every 28 (twenty-eight) days. Patient not taking: Reported on 02/13/2022 12/26/21   Cirigliano, Vito V, DO  torsemide (DEMADEX) 100 MG tablet TAKE 1 TABLET BY MOUTH EVERY  MORNING (EVENING DOSE 80 MG) Patient taking differently: Take 80-100 mg by mouth See admin instructions. Take '100mg'$  by mouth every morning and 80 every evening. 01/03/22   Larey Dresser, MD   Allergies  Allergen Reactions   Dilaudid [Hydromorphone] Other (See Comments)    Excessive Somnolence- Required Narcan   Fentanyl Other (See Comments)    Required Narcan when given with Dilaudid. Tolerated single dose when given during procedure 02/03/2019.    Codeine Nausea And Vomiting    Hallucinations    Morphine And Related Nausea And Vomiting    Hallucinations    Hydrocodone Nausea Only and Other (See Comments)    Nausea and lethargy   Oxycodone Nausea Only and Other (See Comments)    Nausea and lethargy   Tramadol Nausea Only and Other (See Comments)    Nausea and lethargy   Whole Blood Itching and Nausea And Vomiting    Pt and family stated pt will receive blood products but had a reaction requiring benadryl on a previous administration.    Sulfonamide Derivatives Other (See Comments)    Dry mouth   Review of Systems  Respiratory:  Positive for cough.   Neurological:  Positive for weakness.    Physical Exam Constitutional:      Appearance: She is underweight. She is ill-appearing.     Interventions: Nasal cannula in place.  Cardiovascular:     Rate and Rhythm: Normal rate.  Pulmonary:     Breath sounds: Decreased air movement present.  Musculoskeletal:     Comments: Generalized weakness and muscle atrophy  Skin:    General: Skin is warm and dry.  Neurological:     Mental Status: She is alert.     Vital Signs: BP (!) 129/55   Pulse 60   Temp 97.9 F (36.6 C) (Oral)   Resp 19   Ht '5\' 6"'$  (1.676 m)   Wt 64.4 kg   SpO2 96%   BMI 22.92 kg/m  Pain Scale: 0-10 POSS *See Group Information*: 1-Acceptable,Awake and alert Pain Score: 0-No pain   SpO2: SpO2: 96 % O2 Device:SpO2: 96 % O2 Flow Rate: .O2 Flow Rate (L/min): 2 L/min  IO: Intake/output summary:   Intake/Output Summary (Last 24 hours) at 02/25/2022 1329 Last data filed at 02/25/2022 1002 Gross per 24 hour  Intake 240 ml  Output 850 ml  Net -610 ml    LBM: Last BM Date : 02/24/22 Baseline Weight: Weight: 64.4 kg Most recent weight: Weight: 64.4 kg     Palliative Assessment/Data: 50 %      Discussed with Dr. Pietro Cassis and bedside RN  Time In: 1200 Time Out: 1330 Time Total: 90 minutes Greater than 50%  of this time was spent counseling and coordinating care related to the above assessment and plan.  Signed by: Wadie Lessen, NP   Please contact Palliative Medicine Team phone at [phone] for questions and concerns.  For individual provider: See  Amion

## 2022-02-25 NOTE — Progress Notes (Signed)
Physical Therapy Treatment Patient Details Name: [name] MRN: [id number] DOB: 10-08-1933 Today's Date: 02/25/2022   History of Present Illness Pt is 88 yo female presents [facility] on 10/11 with low Hgb and iron with heme + stools, covid +. Workup for UGIB. Received unit of blood 10/12 and developed possible transfusion reaction, including hypoxia, rigors. NSTEMI from demand ischemia. 10/14 likely neuroendocrine tumor and likely metastases pancreas/liver; 10/15 angiogram and artery embolization; 10/19 L thoracentesis 900 mL; 10/20 plan for R thoracentesis PMHx:  afib, dCHF, severe tricuspid regurgitation, NICM s/p ICD, COPD, PAD, CKD3, RCC s/p nephrectomy, ischemic colitis, bilat THA.    PT Comments    Making progress, although somewhat self-limiting. Requires encouragement for OOB activity. Did manage to ambulate with min guard assist in room, up to 18 feet x2 today. VSS with 2L supplemental O2 maintained 94%. Patient will continue to benefit from skilled physical therapy services to further improve independence with functional mobility.    Recommendations for follow up therapy are one component of a multi-disciplinary discharge planning process, led by the attending physician.  Recommendations may be updated based on patient status, additional functional criteria and insurance authorization.  Follow Up Recommendations  Home health PT (Would benefit froM SNF but family/pt decline)     Assistance Recommended at Discharge Frequent or constant Supervision/Assistance  Patient can return home with the following A little help with walking and/or transfers;A little help with bathing/dressing/bathroom;Assistance with cooking/housework;Help with stairs or ramp for entrance   Equipment Recommendations  BSC/3in1;Wheelchair (measurements PT)    Recommendations for Other Services       Precautions / Restrictions Precautions Precautions: Fall Precaution Comments: monitor  O2 Restrictions Weight Bearing Restrictions: No     Mobility  Bed Mobility               General bed mobility comments: In recliner    Transfers Overall transfer level: Needs assistance Equipment used: Rolling walker (2 wheels) Transfers: Sit to/from Stand Sit to Stand: Min assist           General transfer comment: Min assist for boost to stand, x3 from recliner, progressing into a min guard level, using UEs to press from arm rests.    Ambulation/Gait Ambulation/Gait assistance: Min guard Gait Distance (Feet): 18 Feet (x2) Assistive device: Rolling walker (2 wheels) Gait Pattern/deviations: Decreased step length - right, Decreased step length - left, Shuffle, Step-through pattern Gait velocity: decr Gait velocity interpretation: <1.31 ft/sec, indicative of household ambulator   General Gait Details: Min guard with assist to manage lines/leads/tubes in bedroom. Maintains 94% on 2L supplemental O2 mild/mod DOE on second bout, up to 18 feet each lap with seate rest break between bouts.   Stairs             Wheelchair Mobility    Modified Rankin (Stroke Patients Only)       Balance Overall balance assessment: Needs assistance Sitting-balance support: No upper extremity supported, Feet supported Sitting balance-Leahy Scale: Good     Standing balance support: Single extremity supported Standing balance-Leahy Scale: Poor Standing balance comment: Requiring at least single UE support and RW                            Cognition Arousal/Alertness: Awake/alert Behavior During Therapy: Flat affect Overall Cognitive Status: Within Functional Limits for tasks assessed  General Comments: Pt needs much encouragement and incr time to participate        Exercises      General Comments General comments (skin integrity, edema, etc.): VSS      Pertinent Vitals/Pain Pain Assessment Pain  Assessment: Faces Faces Pain Scale: Hurts little more Pain Location: generalized Pain Descriptors / Indicators: Sore Pain Intervention(s): Monitored during session, Repositioned    Home Living                          Prior Function            PT Goals (current goals can now be found in the care plan section) Acute Rehab PT Goals PT Goal Formulation: With patient Time For Goal Achievement: 03/01/22 Potential to Achieve Goals: Good Progress towards PT goals: Progressing toward goals    Frequency    Min 3X/week      PT Plan Current plan remains appropriate    Co-evaluation              AM-PAC PT "6 Clicks" Mobility   Outcome Measure  Help needed turning from your back to your side while in a flat bed without using bedrails?: A Little Help needed moving from lying on your back to sitting on the side of a flat bed without using bedrails?: A Little Help needed moving to and from a bed to a chair (including a wheelchair)?: A Little Help needed standing up from a chair using your arms (e.g., wheelchair or bedside chair)?: A Little Help needed to walk in hospital room?: A Little Help needed climbing 3-5 steps with a railing? : Total 6 Click Score: 16    End of Session Equipment Utilized During Treatment: Gait belt;Oxygen Activity Tolerance: Patient tolerated treatment well (Self limiting - requires encouragement.) Patient left: with call bell/phone within reach;in chair;with chair alarm set   PT Visit Diagnosis: Other abnormalities of gait and mobility (R26.89);Difficulty in walking, not elsewhere classified (R26.2)     Time: 5027-7412 PT Time Calculation (min) (ACUTE ONLY): 21 min  Charges:  $Gait Training: 8-22 mins                     Candie Mile, PT, DPT Physical Therapist Acute Rehabilitation Services [place] 02/25/2022, 10:52  AM

## 2022-02-25 NOTE — Progress Notes (Signed)
PROGRESS NOTE  [name]  DOB: [birth year]  PCP: Ria Bush, MD HKV:[id number]  DOA: 02/13/2022  LOS: 54 days  Hospital Day: 13  Brief narrative: [name] is a 88 y.o. female with PMH significant for HTN, HLD,  CAD, cardiomyopathy s/p ICD, COPD, pulm hypertension, A-fib on Eliquis, aortic stenosis, GI bleed with AVMs, CKD, hypothyroidism, OA, RCC s/p right nephrectomy, celiac and SMA stenosis. Patient was diagnosed with COVID 19 a week prior and had constant fatigue.  3 days prior she started having melena.  Blood work done by PCP showed low hemoglobin and low iron level and hence sent to ED on 10/11. Patient was initially admitted to [facility].  GI was consulted. Started on blood transfusion. 10/12, while getting blood transfusion, patient started having rigors, hypoxia raising concern of transfusion reaction.  Transfusion was stopped, she was given IV Solu-Medrol.  She also developed hypotension and fever, antibiotics was started.  She complained of chest pain, and had troponin elevated. 10/13, underwent EGD that showed 2 nonbleeding AVMs s/p cautery and enteroscopy 10/14, hemoglobin continued to drop to 7.1 requiring blood transfusion transferred to ICU in light of transfusion reaction 2 days ago. 10/14, CT angio abdomen pelvis showed a new 4.7 x 3.9 cm hypervascular tumor in the pancreatic head/uncinate process, most consistent with neuroendocrine tumor.  It is adjacent to duodenum and was likely to remain to be the source of GI bleeding.  Likely metastases were found in the pancreatic tail as well as liver. 10/15, s/p angiogram and GDA/pancreaticoduodenal artery embolization by IR 10/17, after clinical stabilization, patient was transferred out to [facility] service. See below for details  Subjective: Patient was seen and examined this morning.   Sitting up in recliner.  On low-flow oxygen.  Has been refusing meds since yesterday because of ' nausea'.  Daughter at bedside.   Labs from this morning with drop in hemoglobin to 8.1 today.  Reports melanotic stool yesterday.  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  Hemoglobin continued to drop and hence angiogram was obtained on 10/14 which showed a new diagnosis of pancreatic mass adjacent to duodenum as the likely source of bleeding 10/14, CT abdomen showed a new 4.7 x 3.9 cm hypervascular tumor in the pancreatic head/uncinate process, adjacent to duodenum, most consistent with neuroendocrine tumor.  Likely metastases were found in the pancreatic tail as well as liver. 10/15, s/p angiogram and GDA/pancreaticoduodenal artery embolization by IR.   Based on hemoglobin trend, patient seems to continue to have slow bleeding. Continue PPI. GI follow-up on 10/22 appreciated.  No need of further inpatient intervention at this time. GI plans to do EUS as an outpatient, currently scheduled for 10/30.  Acute Blood loss anemia  Continues to have slow bleeding.  So far received received 5 units of PRBC with premedication, 2 dose of IV iron.  Hemoglobin dropped from 8.5 to 8.1 in the last 24 hours. Recent Labs    06/27/21 1024 08/08/21 1041 08/16/21 1303 08/28/21 1259 08/28/21 1300 09/26/21 1045 11/08/21 1306 12/20/21 1200 02/12/22 1655 02/12/22 1657 02/13/22 1625 02/13/22 1640 02/13/22 2106 02/14/22 0245 02/21/22 0218 02/22/22 0306 02/23/22 0446 02/24/22 0214 02/25/22 0406  HGB 11.6*   < > 11.6* 11.1*  --  11.8* 11.1* 11.1*   < >  --    < >  --  7.7*   < > 8.1* 7.5* 7.0* 8.5* 8.1*  MCV 89.0   < > 89.2 91.4  --  88.3 86.7 86.9   < >  --    < >  --  88.1   < > 92.9 91.6 93.4 92.6 94.4  VITAMINB12  --   --   --   --  927*  --  992*  --   --   --   --  2,173*  --   --   --   --   --   --    --   FOLATE  --   --   --   --  18.8  --   --   --   --   --   --   --   --   --   --   --   --   --   --   FERRITIN 25  --  150.9 104  --  46.6  --  30.9  --  768*  --  629*  --   --   --   --   --   --   --   TIBC 399  --  267 287  --   --   --   --   --   --   --  189*  --   --   --   --   --   --   --   IRON 68  --  56 66  --   --   --   --   --  21*  --  21*  --   --   --   --   --   --   --   RETICCTPCT  --   --   --   --   --   --   --   --   --   --   --   --  1.3  --   --   --   --   --   --    < > = values in this interval not displayed.   Transfusion reaction  Per previous documentations, patient had transfusion reaction 2 years ago and on 10/12 this hospitalization.  She was diaphoretic and was hypotensive.   Before last transfusion yesterday 10/18, she was premedicated with IV Benadryl and IV steroids and hence she did not have any transfusion reaction.  Moderate bilateral pleural effusions Noted on CT scan 10/14. 10/19, underwent left thoracentesis with removal of 900 mL of hazy amber fluid 10/20, under right thoracentesis with removal of 850 mill of fluid. Status improving.  Able to take deeper breaths.  Mucinex for cough.  Chronic diastolic CHF Essential hypertension Chronic pulmonary hypertension Cardiomyopathy s/p ICD Mild aortic stenosis Last echo 10/13 with EF 60 to 65%, moderate concentric LVH, grade 2 diastolic dysfunction,, mild aortic stenosis, right ventricular pressure and volume overload, severely elevated pulm artery pressure to 89 mmHg. PTA on torsemide 100 mg a.m., 80 mg p.m., metolazone 2.5 mg weekly, potassium supplement Currently diuretics on hold.  Diuretics plan as below.   AKI on CKD 3b  Baseline creatinine less than 1.5.   Noted a trend in last 3 to 4 days.  Creatinine worsened to peak at 2.24.  Improving now.  Given her severe right-sided heart failure and pulmonary hypertension, she needs some form of diuresis.  Creatinine improving.  Respiratory  status stable.  Recent Labs    02/16/22 0454 02/17/22 0300 02/18/22 2348 02/19/22 1003 02/20/22 0653 02/21/22 0218 02/22/22 0306 02/23/22 0446 02/24/22 0214 02/25/22 0406  BUN 46* 38* 53* 57* 63* 65* 57* 45* 36* 29*  CREATININE 1.33* 1.40* 2.03* 2.21* 2.24* 1.99* 1.64* 1.50* 1.35* 1.23*   Hyperkalemia Potassium level improved after Lokelma yesterday. Recent Labs  Lab 02/19/22 1003 02/20/22 0653 02/21/22 0218 02/22/22 0306 02/23/22 0446 02/24/22 0214 02/25/22 0406  K 4.3   < > 5.3* 5.4* 4.6 5.2* 4.3  MG 2.4  --   --   --   --   --   --   PHOS 2.8  --   --   --   --   --   --    < > = values in this interval not displayed.   Chronic A-fib Eliquis is on hold.  Will resume once hemoglobin stabilizes.     CAD, HLD  Celiac and SMA stenosis. Patient with a paced rhythm. Patient is rate controlled. Eliquis being held. Continue Crestor 40 mg daily and Zetia 10 mg daily  Recent COVID-19 infection with possible pneumonia Completed 5-day course of IV Zosyn Respiratory status stable. Continue Robitussin as needed, albuterol as needed  Hypothyroidism Synthroid  RCC  s/p right nephrectomy   Goals of care   Code Status: DNR.  Palliative care consulted.  Patient may qualify for hospice care.  Mobility: Encourage ambulation.  PT eval suggested home health PT.  Skin assessment:     Nutritional status:  Body mass index is 22.92 kg/m.          Diet:  Diet Order             Diet regular Room service appropriate? Yes; Fluid consistency: Thin  Diet effective now                   DVT prophylaxis:  SCDs Start: 02/13/22 1923   Antimicrobials: None Fluid: Not on IV fluid Consultants: GI Family Communication: Daughter at bedside  Status is: Inpatient  Continue in-hospital care because: Continue monitor for hemoglobin and renal function. Level of care: Progressive   Dispo: The patient is from: Home              Anticipated d/c is to: Pending clinical  course              Patient currently is not medically stable to d/c.   Difficult to place patient No     Infusions:   lactated ringers      Scheduled Meds:  Chlorhexidine Gluconate Cloth  6 each Topical Daily   ezetimibe  10 mg Oral Daily   guaiFENesin  1,200 mg Oral BID   lidocaine  5 mL Intradermal Once   pantoprazole  40 mg Oral BID   rosuvastatin  40 mg Oral Daily   senna-docusate  1 tablet Oral BID    PRN meds: acetaminophen **OR** acetaminophen, albuterol, guaiFENesin-dextromethorphan, lactated ringers, metoCLOPramide (REGLAN) injection, ondansetron (ZOFRAN) IV, mouth rinse   Antimicrobials: Anti-infectives (From admission, onward)    Start     Dose/Rate Route Frequency Ordered Stop   02/14/22 1400  piperacillin-tazobactam (ZOSYN) IVPB 3.375 g        3.375 g 12.5 mL/hr over 240 Minutes Intravenous Every 8 hours 02/14/22 1245 02/16/22 0702       Objective: Vitals:   02/25/22 0842 02/25/22 1135  BP: (!) 126/56 (!) 129/55  Pulse: 60   Resp: 18 19  Temp: 97.8  F (36.6 C) 97.9 F (36.6 C)  SpO2: 97% 96%    Intake/Output Summary (Last 24 hours) at 02/25/2022 1432 Last data filed at 02/25/2022 1002 Gross per 24 hour  Intake 240 ml  Output 850 ml  Net -610 ml   Filed Weights   02/13/22 1507  Weight: 64.4 kg   Weight change:  Body mass index is 22.92 kg/m.   Physical Exam: General exam: Pleasant, elderly Caucasian female.  Uncomfortable.  Anxious look Skin: No rashes, lesions or ulcers. HEENT: Atraumatic, normocephalic, no obvious bleeding Lungs: No crackles, no wheezing. CVS: Regular rate and rhythm, systolic ejection murmur noted GI/Abd soft, nontender, nondistended, bowel sound present CNS: Alert, awake, oriented to place and person Psychiatry: Remains anxious Extremities: No pedal edema, no calf tenderness  Data Review: I have personally reviewed the laboratory data and studies available.  F/u labs ordered Unresulted Labs (From admission,  onward)     Start     Ordered   02/24/22 3291  Basic metabolic panel  Daily at 5am,   R     Question:  Specimen collection method  Answer:  Lab=Lab collect   02/23/22 1136   02/24/22 0500  CBC with Differential/Platelet  Daily at 5am,   R     Question:  Specimen collection method  Answer:  Lab=Lab collect   02/23/22 1136            Signed, Terrilee Croak, MD [facility] 02/25/2022

## 2022-02-26 ENCOUNTER — Encounter: Payer: Self-pay | Admitting: Gastroenterology

## 2022-02-26 ENCOUNTER — Encounter: Payer: Self-pay | Admitting: Oncology

## 2022-02-26 DIAGNOSIS — K922 Gastrointestinal hemorrhage, unspecified: Secondary | ICD-10-CM | POA: Diagnosis not present

## 2022-02-26 LAB — CBC WITH DIFFERENTIAL/PLATELET
Abs Immature Granulocytes: 0.08 10*3/uL — ABNORMAL HIGH (ref 0.00–0.07)
Basophils Absolute: 0 10*3/uL (ref 0.0–0.1)
Basophils Relative: 0 %
Eosinophils Absolute: 0 10*3/uL (ref 0.0–0.5)
Eosinophils Relative: 0 %
HCT: 25.1 % — ABNORMAL LOW (ref 36.0–46.0)
Hemoglobin: 8 g/dL — ABNORMAL LOW (ref 12.0–15.0)
Immature Granulocytes: 2 %
Lymphocytes Relative: 5 %
Lymphs Abs: 0.2 10*3/uL — ABNORMAL LOW (ref 0.7–4.0)
MCH: 30.5 pg (ref 26.0–34.0)
MCHC: 31.9 g/dL (ref 30.0–36.0)
MCV: 95.8 fL (ref 80.0–100.0)
Monocytes Absolute: 0.3 10*3/uL (ref 0.1–1.0)
Monocytes Relative: 7 %
Neutro Abs: 3.6 10*3/uL (ref 1.7–7.7)
Neutrophils Relative %: 86 %
Platelets: 139 10*3/uL — ABNORMAL LOW (ref 150–400)
RBC: 2.62 MIL/uL — ABNORMAL LOW (ref 3.87–5.11)
RDW: 18.3 % — ABNORMAL HIGH (ref 11.5–15.5)
WBC: 4.1 10*3/uL (ref 4.0–10.5)
nRBC: 0.5 % — ABNORMAL HIGH (ref 0.0–0.2)

## 2022-02-26 LAB — BASIC METABOLIC PANEL
Anion gap: 6 (ref 5–15)
BUN: 19 mg/dL (ref 8–23)
CO2: 28 mmol/L (ref 22–32)
Calcium: 8.4 mg/dL — ABNORMAL LOW (ref 8.9–10.3)
Chloride: 105 mmol/L (ref 98–111)
Creatinine, Ser: 1.07 mg/dL — ABNORMAL HIGH (ref 0.44–1.00)
GFR, Estimated: 51 mL/min — ABNORMAL LOW (ref >60)
Glucose, Bld: 91 mg/dL (ref 70–99)
Potassium: 4 mmol/L (ref 3.5–5.1)
Sodium: 139 mmol/L (ref 135–145)

## 2022-02-26 MED ORDER — FUROSEMIDE 40 MG PO TABS
40.0000 mg | ORAL_TABLET | Freq: Once | ORAL | Status: AC
  Administered 2022-02-26: 40 mg via ORAL
  Filled 2022-02-26: qty 1

## 2022-02-26 MED ORDER — HEPARIN SODIUM (PORCINE) 5000 UNIT/ML IJ SOLN
5000.0000 [IU] | Freq: Three times a day (TID) | INTRAMUSCULAR | Status: DC
  Administered 2022-02-26 – 2022-03-01 (×10): 5000 [IU] via SUBCUTANEOUS
  Filled 2022-02-26 (×10): qty 1

## 2022-02-26 MED ORDER — SPIRONOLACTONE 12.5 MG HALF TABLET
12.5000 mg | ORAL_TABLET | Freq: Once | ORAL | Status: AC
  Administered 2022-02-26: 12.5 mg via ORAL
  Filled 2022-02-26: qty 1

## 2022-02-26 MED ORDER — POTASSIUM CHLORIDE CRYS ER 20 MEQ PO TBCR
40.0000 meq | EXTENDED_RELEASE_TABLET | Freq: Once | ORAL | Status: AC
  Administered 2022-02-26: 40 meq via ORAL
  Filled 2022-02-26: qty 2

## 2022-02-26 NOTE — Care Management (Signed)
    Durable Medical Equipment  (From admission, onward)           Start     Ordered   02/26/22 1155  For home use only DME Bedside commode  Once       Question:  Patient needs a bedside commode to treat with the following condition  Answer:  Weakness   02/26/22 1154   02/21/22 1509  For home use only DME 3 n 1  Once        02/21/22 1512   02/21/22 1506  For home use only DME lightweight manual wheelchair with seat cushion  Once       Comments: Patient suffers from weakness and decreased activity tolerance which impairs their ability to perform daily activities like bathing, dressing, and toileting in the home.  A walker will not resolve  issue with performing activities of daily living. A wheelchair will allow patient to safely perform daily activities. Patient is not able to propel themselves in the home using a standard weight wheelchair due to general weakness. Length of need 6 months . Accessories: elevating leg rests (ELRs), wheel locks, extensions and anti-tippers.   02/21/22 1508

## 2022-02-26 NOTE — Progress Notes (Addendum)
This chaplain responded to PMT consult for spiritual care and receiving communion as part of the Pt. [employer] faith tradition.   The Pt. Is awake and waiting on a bath. The Pt. Daughter-[name] is at the bedside. The chaplain listened reflectively as she began rapport building with the family. The chaplain understands a revisit is planned between 2-4pm today.   **1412 This chaplain returned for [facility] with the Pt. The chaplain listened as the Pt. shared her story of faith, belief in God's love, and desire to share Holy Communion. The remainder of the time together was spent in silence as the Pt. reflected on and listened to some of her favorite music. The Pt. daughter joined the Pt. at the end of the visit.  This chaplain is available for F/U spiritual care as needed.   Chaplain Sallyanne Kuster [phone]

## 2022-02-26 NOTE — Progress Notes (Signed)
PROGRESS NOTE                                                                                                                                                                                                             Patient Demographics:    [name], is a 88 y.o. female, DOB - March 01, 1934, WCH:[id number]  Outpatient Primary MD for the patient is Ria Bush, MD    LOS - 77  Admit date - 02/13/2022    Chief Complaint  Patient presents with   Melena   Abnormal Lab       Brief Narrative (HPI from H&P)   88 y.o. female with PMH significant for HTN, HLD,  CAD, cardiomyopathy s/p ICD, COPD, pulm hypertension, A-fib on Eliquis, aortic stenosis, GI bleed with AVMs, CKD, hypothyroidism, OA, RCC s/p right nephrectomy, celiac and SMA stenosis. Patient was diagnosed with COVID 19 a week prior and had constant fatigue.  3 days prior she started having melena.  Blood work done by PCP showed low hemoglobin and low iron level and hence sent to ED on 10/11.  Patient was admitted to [facility].  GI was consulted.  She was transferred to my care on 02/26/2022 on day 13 of her hospital stay.    Subjective:    [name] today has, No headache, No chest pain, No abdominal pain - No Nausea, No new weakness tingling or numbness, No Cough but +ve SOB.   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  She was seen by GI underwent EGD on 02/15/2022.  EGD showed 2 nonbleeding AVMs these were cauterized, she continued to have subsequent bleeding with dropping H&H requiring blood transfusions.    CT angiogram of the abdomen pelvis was obtained on 02/16/2022, this showed pancreatic mass adjacent to  duodenum, this was thought to be the likely source of bleeding.  CT further confirmed a 4.7 x 3.9 cm hypervascular pancreatic tumor at the pancreatic head/unkind 8 process.  This was most consistent with possible underlying pancreatic neuroendocrine tumor with mets found in pancreatic tail as well as liver.  Due to  continued bleeding and drop in H&H she was seen by IR and she underwent GDA/pancreatic duodenal artery embolization on 02/17/2022.  Subsequently she has maintained GI bleeding, last transfusion on 02/23/2022 with stabilization in H&H.  Now she is developing some heme dilution due to fluid overload.  She was seen by GI and IR this admission plan is to do outpatient EUS on 03/04/2022.  Continue to monitor H&H, continue PPI.  Gently diurese to remove fluid overload and heme dilution and trend.   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   Transfusion reaction - Per previous documentations, patient had transfusion reaction 2 years ago and on 10/12 this hospitalization.  She was diaphoretic and was hypotensive.  Premedicate with Benadryl and steroids during future transfusions.    Moderate bilateral pleural effusions - she underwent left-sided thoracentesis with 900 cc of fluid removed on 02/21/2022 followed by right-sided thoracentesis and 850 cc of fluid removed on 02/22/2022.  No diagnostic studies or cytology was ordered, I called lab on 02/26/2022 unfortunately they do not have any specimen stored.   Chronic diastolic CHF with a EF of 65%, chronic nonischemic cardiomyopathy requiring AICD.  Chronic pulmonary hypertension and mild aortic stenosis.  Is on high doses of diuretics at home currently on evidence of moderate to severe fluid overload commence diuresis with caution and monitor.  AKI on CKD 3b  - Baseline creatinine less than 1.5.  Developed AKI due to blood loss, after transfusion AKI has  resolved monitor with diuresis.   Paroxysmal atrial fibrillation.  Mali vas 2 score of greater than 3 - Eliquis is on hold due to GI bleed and pending EUS.  Will resume once hemoglobin stabilizes and cleared by GI from the EUS standpoint.     CAD, HLD, Celiac and SMA stenosis.  Continue Crestor and Zetia for secondary prevention,   Recent COVID-19 infection with possible pneumonia - Completed 5-day course of IV Zosyn Respiratory status stable.Continue Robitussin as needed, albuterol as needed.  Requested to sit up in chair use I-S and flutter valve for pulmonary toiletry.  On home oxygen.   Hypothyroidism - Synthroid   RCC  - s/p right nephrectomy        Condition - Extremely Guarded  Family Communication  :  daughter [name] on 02/26/22  Code Status :  DNR  Consults  :  GI, Pall Care  PUD Prophylaxis : PPI   Procedures  :      10/12, while getting blood transfusion, patient started having rigors, hypoxia raising concern of transfusion reaction.  Transfusion was stopped, she was given IV Solu-Medrol.  She also developed hypotension and fever, antibiotics was started.  She complained of chest pain, and had troponin elevated. 10/13, underwent EGD that showed 2 nonbleeding AVMs s/p cautery and enteroscopy 10/14, hemoglobin continued to drop to 7.1 requiring blood transfusion transferred to ICU in light of transfusion reaction 2 days ago. 10/14, CT angio abdomen pelvis showed a new 4.7 x 3.9 cm hypervascular tumor in the pancreatic head/uncinate process, most consistent with neuroendocrine tumor.  It is adjacent to duodenum and was likely to remain to be the source of GI bleeding.  Likely metastases were found in the pancreatic tail as well as liver. 10/15, s/p angiogram and GDA/pancreaticoduodenal artery embolization by IR 10/17, after clinical stabilization, patient was transferred out to [facility] service. 02/20/2022, 02/23/2022.  1 unit of packed RBC each See below for  details. Underwent  right and left sided ultrasound-guided thoracentesis on 02/21/2022 on 02/22/2022.  No studies were ordered.      Disposition Plan  :    Status is: Inpatient  DVT Prophylaxis  :    Place TED hose Start: 02/26/22 0839 SCDs Start: 02/13/22 1923     Lab Results  Component Value Date   PLT 139 (L) 02/26/2022    Diet :  Diet Order             Diet regular Room service appropriate? Yes; Fluid consistency: Thin  Diet effective now                    Inpatient Medications  Scheduled Meds:  Chlorhexidine Gluconate Cloth  6 each Topical Daily   ezetimibe  10 mg Oral Daily   furosemide  40 mg Oral Once   guaiFENesin  1,200 mg Oral BID   lidocaine  5 mL Intradermal Once   pantoprazole  40 mg Oral BID   rosuvastatin  40 mg Oral Daily   senna-docusate  1 tablet Oral BID   spironolactone  12.5 mg Oral Once   Continuous Infusions: PRN Meds:.acetaminophen **OR** acetaminophen, albuterol, guaiFENesin-dextromethorphan, metoCLOPramide (REGLAN) injection, ondansetron (ZOFRAN) IV, mouth rinse  Antibiotics  :    Anti-infectives (From admission, onward)    Start     Dose/Rate Route Frequency Ordered Stop   02/14/22 1400  piperacillin-tazobactam (ZOSYN) IVPB 3.375 g        3.375 g 12.5 mL/hr over 240 Minutes Intravenous Every 8 hours 02/14/22 1245 02/16/22 0702         Objective:   Vitals:   02/25/22 1135 02/25/22 1931 02/26/22 0013 02/26/22 0425  BP: (!) 129/55 (!) 143/44 (!) 121/56 (!) 120/58  Pulse:  65 67 62  Resp: '19 20 18 20  '$ Temp: 97.9 F (36.6 C) 98.1 F (36.7 C) 98.6 F (37 C) 98.1 F (36.7 C)  TempSrc: Oral Oral Oral Axillary  SpO2: 96% 97% 93% 96%  Weight:      Height:        Wt Readings from Last 3 Encounters:  02/13/22 64.4 kg  02/12/22 64.4 kg  01/09/22 66.4 kg     Intake/Output Summary (Last 24 hours) at 02/26/2022 0846 Last data filed at 02/26/2022 0300 Gross per 24 hour  Intake 240 ml  Output 450 ml  Net -210 ml     Physical  Exam  Awake Alert, No new F.N deficits, Normal affect [place].AT,PERRAL Supple Neck, No JVD,   Symmetrical Chest wall movement, Good air movement bilaterally, +ve rales RRR,No Gallops,Rubs or new Murmurs,  +ve B.Sounds, Abd Soft, No tenderness,   2+ leg edema        Data Review:    CBC Recent Labs  Lab 02/22/22 0306 02/23/22 0446 02/24/22 0214 02/25/22 0406 02/26/22 0637  WBC 4.7 5.2 3.9* 4.7 4.1  HGB 7.5* 7.0* 8.5* 8.1* 8.0*  HCT 22.9* 22.7* 26.2* 25.3* 25.1*  PLT 163 179 172 151 139*  MCV 91.6 93.4 92.6 94.4 95.8  MCH 30.0 28.8 30.0 30.2 30.5  MCHC 32.8 30.8 32.4 32.0 31.9  RDW 17.3* 18.3* 17.0* 17.7* 18.3*  LYMPHSABS 0.2* 0.2* 0.2* 0.3* 0.2*  MONOABS 0.4 0.5 0.2 0.4 0.3  EOSABS 0.0 0.0 0.0 0.0 0.0  BASOSABS 0.0 0.0 0.0 0.0 0.0    Electrolytes Recent Labs  Lab 02/19/22 1003 02/20/22 0653 02/22/22 0306 02/23/22 0446 02/24/22 0214 02/25/22 0406 02/26/22 0637  NA 135   < >  137 139 140 141 139  K 4.3   < > 5.4* 4.6 5.2* 4.3 4.0  CL 102   < > 108 109 106 106 105  CO2 24   < > '22 26 27 25 28  '$ GLUCOSE 92   < > 151* 96 145* 88 91  BUN 57*   < > 57* 45* 36* 29* 19  CREATININE 2.21*   < > 1.64* 1.50* 1.35* 1.23* 1.07*  CALCIUM 8.3*   < > 8.1* 8.2* 8.4* 8.3* 8.4*  AST 22  --   --   --   --   --   --   ALT 15  --   --   --   --   --   --   ALKPHOS 48  --   --   --   --   --   --   BILITOT 1.1  --   --   --   --   --   --   ALBUMIN 2.5*  --   --   --   --   --   --   MG 2.4  --   --   --   --   --   --    < > = values in this interval not displayed.   Radiology Reports IR THORACENTESIS ASP PLEURAL SPACE W/IMG GUIDE  Result Date: 02/22/2022 INDICATION: Patient with history of shortness of breath, found to have bilateral pleural effusions. Request is for therapeutic right-sided thoracentesis EXAM: ULTRASOUND GUIDED RIGHT-SIDED THERAPEUTIC THORACENTESIS MEDICATIONS: None. COMPLICATIONS: None immediate. PROCEDURE: An ultrasound guided thoracentesis was thoroughly  discussed with the patient and questions answered. The benefits, risks, alternatives and complications were also discussed. The patient understands and wishes to proceed with the procedure. Written consent was obtained. Ultrasound was performed to localize and mark an adequate pocket of fluid in the right chest. The area was then prepped and draped in the normal sterile fashion. 1% Lidocaine was used for local anesthesia. Under ultrasound guidance a 6 Fr Safe-T-Centesis catheter was introduced. Thoracentesis was performed. The catheter was removed and a dressing applied. FINDINGS: A total of approximately 850 mL of straw-colored fluid was removed. IMPRESSION: Successful ultrasound guided right-sided therapeutic thoracentesis yielding 850 mL of pleural fluid. Read by: Rushie Nyhan, NP Electronically Signed   By: Albin Felling M.D.   On: 02/22/2022 16:42   DG Chest 1 View  Result Date: 02/22/2022 CLINICAL DATA:  Post right thoracentesis EXAM: CHEST  1 VIEW COMPARISON:  02/21/2022 FINDINGS: Left-sided implanted cardiac device remains in place. Stable heart size. Aortic atherosclerosis. Decreased right-sided pleural effusion. No significant residual right-sided effusion. Trace left pleural effusion. No pneumothorax. IMPRESSION: Decreased right-sided pleural effusion following thoracentesis. No pneumothorax. Electronically Signed   By: Davina Poke D.O.   On: 02/22/2022 16:00      Signature  Lala Lund M.D on 02/26/2022 at 8:46 AM   -  To page go to [email or web address]

## 2022-02-26 NOTE — TOC Progression Note (Signed)
Transition of Care [facility]) - Progression Note    Patient Details  Name: AARIANNA HOADLEY MRN: [id number] Date of Birth: August 01, 1933  Transition of Care [facility]) CM/SW Contact  Carles Collet, RN Phone Number: 02/26/2022, 11:49 AM  Clinical Narrative:     Damaris Schooner w patient's daughter at bedside.  Discussed DME, she would like WC and BSC delivered to the house, confirmed address in chart and notified [employer]. Discussed plan for home. Confirmed w daughter plan is home health with [facility].   Expected Discharge Plan: [place] Barriers to Discharge: Continued Medical Work up  Expected Discharge Plan and Services Expected Discharge Plan: [place] Choice: NA Living arrangements for the past 2 months: Single Family Home                 DME Arranged: Wheelchair manual, 3-N-1 DME Agency: [employer] Date DME Agency Contacted: 02/22/22   Representative spoke with at DME Agency: [employer] left messager   [place] Agency: [facility] [place] Date [facility] Agency Contacted: 02/22/22 Time [place]: 1716 Representative spoke with at [place]: Left message with rep to call back   Social Determinants of Health (SDOH) Interventions    Readmission Risk Interventions    02/15/2021   11:38 AM 03/31/2020    1:20 PM  Readmission Risk Prevention Plan  Transportation Screening Complete Complete  PCP or Specialist Appt within 5-7 Days Complete   Home Care Screening Complete   Medication Review (RN CM) Complete   PCP or Specialist appointment within 3-5 days of discharge  Not Complete  PCP/Specialist Appt Not Complete comments  office closed for Thanksgiving holiday  [place] or [place]  Complete  SW Recovery Care/Counseling Consult  Complete  [place]  Not Applicable

## 2022-02-26 NOTE — Progress Notes (Signed)
Mobility Specialist Progress Note:   02/26/22 0905  Mobility  Activity  (bed level ex)  Assistive Device None  Range of Motion/Exercises Active;Passive;All extremities  Activity Response Tolerated well  Mobility Referral Yes  $Mobility charge 1 Mobility   Pt requesting to get bath before any OOB mobility, however per RN, it wont be for a while. Despite max encouragement, pt only agreeable to bed level exercises. Tolerated well. Pt c/o incr fluid, making it "hard to move'. Pt left with all needs met, states she will get to chair post-bath.  Nelta Numbers Acute Rehab Secure Chat or Office Phone: [phone]

## 2022-02-26 NOTE — Plan of Care (Signed)

## 2022-02-27 DIAGNOSIS — K8689 Other specified diseases of pancreas: Secondary | ICD-10-CM | POA: Diagnosis not present

## 2022-02-27 DIAGNOSIS — R531 Weakness: Secondary | ICD-10-CM | POA: Diagnosis not present

## 2022-02-27 DIAGNOSIS — K922 Gastrointestinal hemorrhage, unspecified: Secondary | ICD-10-CM | POA: Diagnosis not present

## 2022-02-27 LAB — COMPREHENSIVE METABOLIC PANEL
ALT: 16 U/L (ref 0–44)
AST: 18 U/L (ref 15–41)
Albumin: 2.5 g/dL — ABNORMAL LOW (ref 3.5–5.0)
Alkaline Phosphatase: 44 U/L (ref 38–126)
Anion gap: 6 (ref 5–15)
BUN: 15 mg/dL (ref 8–23)
CO2: 29 mmol/L (ref 22–32)
Calcium: 8 mg/dL — ABNORMAL LOW (ref 8.9–10.3)
Chloride: 103 mmol/L (ref 98–111)
Creatinine, Ser: 1.25 mg/dL — ABNORMAL HIGH (ref 0.44–1.00)
GFR, Estimated: 42 mL/min — ABNORMAL LOW (ref >60)
Glucose, Bld: 116 mg/dL — ABNORMAL HIGH (ref 70–99)
Potassium: 4.1 mmol/L (ref 3.5–5.1)
Sodium: 138 mmol/L (ref 135–145)
Total Bilirubin: 1.6 mg/dL — ABNORMAL HIGH (ref 0.3–1.2)
Total Protein: 5.1 g/dL — ABNORMAL LOW (ref 6.5–8.1)

## 2022-02-27 LAB — CBC WITH DIFFERENTIAL/PLATELET
Abs Immature Granulocytes: 0.07 10*3/uL (ref 0.00–0.07)
Basophils Absolute: 0 10*3/uL (ref 0.0–0.1)
Basophils Relative: 0 %
Eosinophils Absolute: 0 10*3/uL (ref 0.0–0.5)
Eosinophils Relative: 0 %
HCT: 25.8 % — ABNORMAL LOW (ref 36.0–46.0)
Hemoglobin: 8.1 g/dL — ABNORMAL LOW (ref 12.0–15.0)
Immature Granulocytes: 2 %
Lymphocytes Relative: 7 %
Lymphs Abs: 0.3 10*3/uL — ABNORMAL LOW (ref 0.7–4.0)
MCH: 30.2 pg (ref 26.0–34.0)
MCHC: 31.4 g/dL (ref 30.0–36.0)
MCV: 96.3 fL (ref 80.0–100.0)
Monocytes Absolute: 0.3 10*3/uL (ref 0.1–1.0)
Monocytes Relative: 8 %
Neutro Abs: 3 10*3/uL (ref 1.7–7.7)
Neutrophils Relative %: 83 %
Platelets: 138 10*3/uL — ABNORMAL LOW (ref 150–400)
RBC: 2.68 MIL/uL — ABNORMAL LOW (ref 3.87–5.11)
RDW: 18.6 % — ABNORMAL HIGH (ref 11.5–15.5)
WBC: 3.6 10*3/uL — ABNORMAL LOW (ref 4.0–10.5)
nRBC: 0.6 % — ABNORMAL HIGH (ref 0.0–0.2)

## 2022-02-27 LAB — MAGNESIUM: Magnesium: 1.9 mg/dL (ref 1.7–2.4)

## 2022-02-27 MED ORDER — FUROSEMIDE 40 MG PO TABS
60.0000 mg | ORAL_TABLET | Freq: Once | ORAL | Status: AC
  Administered 2022-02-27: 60 mg via ORAL
  Filled 2022-02-27: qty 1

## 2022-02-27 NOTE — Plan of Care (Signed)
  Problem: Education: Goal: Knowledge of General Education information will improve Description: Including pain rating scale, medication(s)/side effects and non-pharmacologic comfort measures Outcome: Progressing   Problem: Health Behavior/Discharge Planning: Goal: Ability to manage health-related needs will improve Outcome: Progressing   Problem: Clinical Measurements: Goal: Ability to maintain clinical measurements within normal limits will improve Outcome: Progressing   Problem: Activity: Goal: Risk for activity intolerance will decrease Outcome: Progressing   Problem: Nutrition: Goal: Adequate nutrition will be maintained Outcome: Progressing   Problem: Coping: Goal: Level of anxiety will decrease Outcome: Progressing   Problem: Education: Goal: Understanding of CV disease, CV risk reduction, and recovery process will improve Outcome: Progressing

## 2022-02-27 NOTE — Progress Notes (Addendum)
Mobility Specialist Progress Note:   02/26/22 1400  Mobility  Activity Transferred to/from [facility]  Level of Assistance Contact guard assist, steadying assist  Assistive Device None  Distance Ambulated (ft) 3 ft  Activity Response Tolerated well  Mobility Referral Yes  $Mobility charge 1 Mobility   Pt requesting to transfer to [facility] for BM. Required only minG for transfer. Pt left with call bell in reach, educated to call when done. Daughter in room.  Nelta Numbers Acute Rehab Secure Chat or Office Phone: [phone]

## 2022-02-27 NOTE — Progress Notes (Signed)
PROGRESS NOTE                                                                                                                                                                                                             Patient Demographics:    [name], is a 88 y.o. female, DOB - 1933-11-06, LNL:[id number]  Outpatient Primary MD for the patient is Ria Bush, MD    LOS - 40  Admit date - 02/13/2022    Chief Complaint  Patient presents with   Melena   Abnormal Lab       Brief Narrative (HPI from H&P)   88 y.o. female with PMH significant for HTN, HLD,  CAD, cardiomyopathy s/p ICD, COPD, pulm hypertension, A-fib on Eliquis, aortic stenosis, GI bleed with AVMs, CKD, hypothyroidism, OA, RCC s/p right nephrectomy, celiac and SMA stenosis. Patient was diagnosed with COVID 19 a week prior and had constant fatigue.  3 days prior she started having melena.  Blood work done by PCP showed low hemoglobin and low iron level and hence sent to ED on 10/11.  Patient was admitted to [facility].  GI was consulted.  She was transferred to my care on 02/26/2022 on day 13 of her hospital stay.    Subjective:   Patient in bed, appears comfortable, denies any headache, no fever, no chest pain or pressure, no shortness of breath , no abdominal pain. No new focal weakness.   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  She was seen by GI underwent EGD on 02/15/2022.  EGD showed 2 nonbleeding AVMs these were cauterized, she continued to have subsequent bleeding with dropping H&H requiring blood transfusions.    CT angiogram of the abdomen pelvis was obtained on 02/16/2022, this showed pancreatic mass  adjacent to duodenum, this was thought to be the likely source of bleeding.  CT further confirmed a 4.7 x 3.9 cm hypervascular pancreatic tumor at the pancreatic head/unkind 8 process.  This was most consistent with possible underlying pancreatic neuroendocrine tumor with mets found in pancreatic tail as well as liver.  Due to  continued bleeding and drop in H&H she was seen by IR and she underwent GDA/pancreatic duodenal artery embolization on 02/17/2022.  Subsequently she has maintained GI bleeding, last transfusion on 02/23/2022 with stabilization in H&H.  Now she is developing some heme dilution due to fluid overload.  She was seen by GI and IR this admission plan is to do outpatient EUS on 03/04/2022.  Continue to monitor H&H, continue PPI.  Gently diurese to remove fluid overload and heme dilution and trend.   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  Transfusion reaction - Per previous documentations, patient had transfusion reaction 2 years ago and on 10/12 this hospitalization.  She was diaphoretic and was hypotensive.  Premedicate with Benadryl and steroids during future transfusions.  Moderate bilateral pleural effusions - she underwent left-sided thoracentesis with 900 cc of fluid removed on 02/21/2022 followed by right-sided thoracentesis and 850 cc of fluid removed on 02/22/2022.  No diagnostic studies or cytology was ordered, I called lab on 02/26/2022 unfortunately they do not have any specimen stored.   Chronic diastolic CHF with a EF of 65%, chronic nonischemic cardiomyopathy requiring AICD.  Chronic pulmonary hypertension and mild aortic stenosis.  Is on high doses of diuretics at home currently on evidence of moderate to severe fluid overload commence diuresis with caution and monitor.  AKI on CKD 3b  - Baseline creatinine 1.6.  Developed AKI due to blood loss, after transfusion AKI has  resolved monitor with diuresis.   Paroxysmal atrial fibrillation.  Mali vas 2 score of greater than 3 - Eliquis is on hold due to GI bleed and pending EUS. Will resume once hemoglobin stabilizes and cleared by GI from the EUS standpoint.  Per GI hold anticoagulation 48 hours before the EUS.   CAD, HLD, Celiac and SMA stenosis.  Continue Crestor and Zetia for secondary prevention,   Recent COVID-19 infection with possible pneumonia - Completed 5-day course of IV Zosyn Respiratory status stable.Continue Robitussin as needed, albuterol as needed.  Requested to sit up in chair use I-S and flutter valve for pulmonary toiletry.  On home oxygen.   Hypothyroidism - Synthroid   RCC  - s/p right nephrectomy        Condition - Extremely Guarded  Family Communication  :  daughter [name] on 02/26/22  Code Status :  DNR  Consults  :  GI, Pall Care  PUD Prophylaxis : PPI   Procedures  :      10/12, while getting blood transfusion, patient started having rigors, hypoxia raising concern of transfusion reaction.  Transfusion was stopped, she was given IV Solu-Medrol.  She also developed hypotension and fever, antibiotics was started.  She complained of chest pain, and had troponin elevated. 10/13, underwent EGD that showed 2 nonbleeding AVMs s/p cautery and enteroscopy 10/14, hemoglobin continued to drop to 7.1 requiring blood transfusion transferred to ICU in light of transfusion reaction 2 days ago. 10/14, CT angio abdomen pelvis showed a new 4.7 x 3.9 cm hypervascular tumor in the pancreatic head/uncinate process, most consistent with neuroendocrine tumor.  It is adjacent to duodenum and was likely to remain to be the source of GI bleeding.  Likely metastases were found in the pancreatic tail as well as liver. 10/15, s/p angiogram and GDA/pancreaticoduodenal artery embolization by IR 10/17, after clinical stabilization, patient was transferred [facility] service. 02/20/2022, 02/23/2022.  1 unit of  packed RBC each  See below for details. Underwent right and left sided ultrasound-guided thoracentesis on 02/21/2022 on 02/22/2022.  No studies were ordered.      Disposition Plan  :    Status is: Inpatient  DVT Prophylaxis  :    heparin injection 5,000 Units Start: 02/26/22 0945 Place TED hose Start: 02/26/22 0839 SCDs Start: 02/13/22 1923     Lab Results  Component Value Date   PLT 138 (L) 02/27/2022    Diet :  Diet Order             Diet regular Room service appropriate? Yes; Fluid consistency: Thin  Diet effective now                    Inpatient Medications  Scheduled Meds:  Chlorhexidine Gluconate Cloth  6 each Topical Daily   ezetimibe  10 mg Oral Daily   furosemide  60 mg Oral Once   guaiFENesin  1,200 mg Oral BID   heparin injection (subcutaneous)  5,000 Units Subcutaneous Q8H   lidocaine  5 mL Intradermal Once   pantoprazole  40 mg Oral BID   rosuvastatin  40 mg Oral Daily   senna-docusate  1 tablet Oral BID   Continuous Infusions: PRN Meds:.acetaminophen **OR** acetaminophen, albuterol, guaiFENesin-dextromethorphan, metoCLOPramide (REGLAN) injection, ondansetron (ZOFRAN) IV, mouth rinse  Antibiotics  :    Anti-infectives (From admission, onward)    Start     Dose/Rate Route Frequency Ordered Stop   02/14/22 1400  piperacillin-tazobactam (ZOSYN) IVPB 3.375 g        3.375 g 12.5 mL/hr over 240 Minutes Intravenous Every 8 hours 02/14/22 1245 02/16/22 0702         Objective:   Vitals:   02/26/22 1916 02/26/22 2330 02/27/22 0407 02/27/22 0820  BP: 116/81 (!) 122/55 (!) 122/48 (!) 109/49  Pulse: 64 61 60 64  Resp: '20 18 19 18  '$ Temp: 98.4 F (36.9 C) 98.2 F (36.8 C) 98.6 F (37 C)   TempSrc: Oral Oral Oral   SpO2: 97% 96% 94%   Weight:      Height:        Wt Readings from Last 3 Encounters:  02/13/22 64.4 kg  02/12/22 64.4 kg  01/09/22 66.4 kg     Intake/Output Summary (Last 24 hours) at 02/27/2022 1028 Last data filed at  02/27/2022 0308 Gross per 24 hour  Intake --  Output 1500 ml  Net -1500 ml     Physical Exam  Awake Alert, No new F.N deficits, Normal affect [place].AT,PERRAL Supple Neck, No JVD,   Symmetrical Chest wall movement, Good air movement bilaterally, +ve rales RRR,No Gallops, Rubs or new Murmurs,  +ve B.Sounds, Abd Soft, No tenderness,   trace edema       Data Review:    CBC Recent Labs  Lab 02/23/22 0446 02/24/22 0214 02/25/22 0406 02/26/22 0637 02/27/22 0756  WBC 5.2 3.9* 4.7 4.1 3.6*  HGB 7.0* 8.5* 8.1* 8.0* 8.1*  HCT 22.7* 26.2* 25.3* 25.1* 25.8*  PLT 179 172 151 139* 138*  MCV 93.4 92.6 94.4 95.8 96.3  MCH 28.8 30.0 30.2 30.5 30.2  MCHC 30.8 32.4 32.0 31.9 31.4  RDW 18.3* 17.0* 17.7* 18.3* 18.6*  LYMPHSABS 0.2* 0.2* 0.3* 0.2* 0.3*  MONOABS 0.5 0.2 0.4 0.3 0.3  EOSABS 0.0 0.0 0.0 0.0 0.0  BASOSABS 0.0 0.0 0.0 0.0 0.0    Electrolytes Recent Labs  Lab 02/23/22 0446 02/24/22 0214 02/25/22 0406 02/26/22 0637 02/27/22 0756  NA 139 140  141 139 138  K 4.6 5.2* 4.3 4.0 4.1  CL 109 106 106 105 103  CO2 '26 27 25 28 29  '$ GLUCOSE 96 145* 88 91 116*  BUN 45* 36* 29* 19 15  CREATININE 1.50* 1.35* 1.23* 1.07* 1.25*  CALCIUM 8.2* 8.4* 8.3* 8.4* 8.0*  AST  --   --   --   --  18  ALT  --   --   --   --  16  ALKPHOS  --   --   --   --  44  BILITOT  --   --   --   --  1.6*  ALBUMIN  --   --   --   --  2.5*  MG  --   --   --   --  1.9   Radiology Reports No results found.    Signature  Lala Lund M.D on 02/27/2022 at 10:28 AM   -  To page go to [email or web address]

## 2022-02-27 NOTE — Progress Notes (Signed)
Occupational Therapy Treatment Patient Details Name: [name] MRN: [id number] DOB: May 01, 1934 Today's Date: 02/27/2022   History of present illness Pt is 88 yo female presents to [facility] on 10/11 with low Hgb and iron with heme + stools, covid +. Workup for UGIB. Received unit of blood 10/12 and developed possible transfusion reaction, including hypoxia, rigors. NSTEMI from demand ischemia. 10/14 likely neuroendocrine tumor and likely metastases pancreas/liver; 10/15 angiogram and artery embolization; 10/19 L thoracentesis 900 mL; 10/20 plan for R thoracentesis PMHx:  afib, dCHF, severe tricuspid regurgitation, NICM s/p ICD, COPD, PAD, CKD3, RCC s/p nephrectomy, ischemic colitis, bilat THA.   OT comments  Session focused on full EOB ADL completion with energy conservation strategy implementation as needed (handout provided). Pt endorses continued weakness but able to to manage UB ADLs with Setup Assist and LB ADLs with min guard-Min A. Pt able to transfer to chair without AD and no more than min guard. VSS on 2 L O2. Pt eager to DC home today and reports adequate family assist at home for ADLs/IADLs.   Recommendations for follow up therapy are one component of a multi-disciplinary discharge planning process, led by the attending physician.  Recommendations may be updated based on patient status, additional functional criteria and insurance authorization.    Follow Up Recommendations  Home health OT    Assistance Recommended at Discharge Intermittent Supervision/Assistance  Patient can return home with the following  A little help with walking and/or transfers;A little help with bathing/dressing/bathroom;Assistance with cooking/housework;Assist for transportation   Equipment Recommendations  None recommended by OT    Recommendations for Other Services      Precautions / Restrictions Precautions Precautions: Fall Precaution Comments: monitor O2 Restrictions Weight Bearing  Restrictions: No       Mobility Bed Mobility Overal bed mobility: Modified Independent Bed Mobility: Supine to Sit     Supine to sit: Modified independent (Device/Increase time)          Transfers Overall transfer level: Needs assistance Equipment used: None Transfers: Sit to/from Stand, Bed to chair/wheelchair/BSC Sit to Stand: Min guard     Step pivot transfers: Min guard     General transfer comment: no AD, light handheld assist for confidence but pt able to easily step to chair after ADLs EOB     Balance Overall balance assessment: Needs assistance Sitting-balance support: No upper extremity supported, Feet supported Sitting balance-Leahy Scale: Good     Standing balance support: No upper extremity supported, Single extremity supported, During functional activity Standing balance-Leahy Scale: Fair                             ADL either performed or assessed with clinical judgement   ADL Overall ADL's : Needs assistance/impaired     Grooming: Set up;Sitting;Oral care   Upper Body Bathing: Set up;Sitting   Lower Body Bathing: Minimal assistance;Sit to/from stand Lower Body Bathing Details (indicate cue type and reason): assist for B lower LE and feet Upper Body Dressing : Set up;Sitting   Lower Body Dressing: Min guard;Sit to/from stand Lower Body Dressing Details (indicate cue type and reason): for underwear mgmt in standing               General ADL Comments: Focus on sponge bathing EOB and energy conservation strategy implementation    Extremity/Trunk Assessment Upper Extremity Assessment Upper Extremity Assessment: Generalized weakness   Lower Extremity Assessment Lower Extremity Assessment: Defer to PT evaluation  Vision   Vision Assessment?: No apparent visual deficits   Perception     Praxis      Cognition Arousal/Alertness: Awake/alert Behavior During Therapy: WFL for tasks assessed/performed Overall  Cognitive Status: Within Functional Limits for tasks assessed                                          Exercises      Shoulder Instructions       General Comments VSS on 2 L O2    Pertinent Vitals/ Pain       Pain Assessment Pain Assessment: Faces Faces Pain Scale: Hurts little more Pain Location: B lower LEs Pain Descriptors / Indicators: Sore Pain Intervention(s): Monitored during session, Limited activity within patient's tolerance  Home Living                                          Prior Functioning/Environment              Frequency  Min 2X/week        Progress Toward Goals  OT Goals(current goals can now be found in the care plan section)  Progress towards OT goals: Progressing toward goals  Acute Rehab OT Goals Patient Stated Goal: increase strength, get home and get back to feeling like myself OT Goal Formulation: With patient Time For Goal Achievement: 03/01/22 Potential to Achieve Goals: Good ADL Goals Pt Will Perform Lower Body Dressing: with modified independence;sit to/from stand Pt Will Transfer to Toilet: with modified independence;ambulating;bedside commode Pt Will Perform Toileting - Clothing Manipulation and hygiene: with modified independence;sit to/from stand Pt Will Perform Tub/Shower Transfer: Tub transfer;with modified independence;shower seat;rolling walker Additional ADL Goal #1: Pt will utilize energy conservation techniques during daily routinue with independence to optimize tolerance and safety.  Plan Discharge plan remains appropriate    Co-evaluation                 AM-PAC OT "6 Clicks" Daily Activity     Outcome Measure   Help from another person eating meals?: None Help from another person taking care of personal grooming?: None Help from another person toileting, which includes using toliet, bedpan, or urinal?: A Little Help from another person bathing (including washing,  rinsing, drying)?: A Little Help from another person to put on and taking off regular upper body clothing?: A Little Help from another person to put on and taking off regular lower body clothing?: A Little 6 Click Score: 20    End of Session Equipment Utilized During Treatment: Oxygen  OT Visit Diagnosis: Muscle weakness (generalized) (M62.81);Unsteadiness on feet (R26.81)   Activity Tolerance Patient tolerated treatment well   Patient Left in chair;with call bell/phone within reach   Nurse Communication Mobility status;Other (comment) (request for new purewick and ice)        Time: 2536-6440 OT Time Calculation (min): 28 min  Charges: OT General Charges $OT Visit: 1 Visit OT Treatments $Self Care/Home Management : 23-37 mins  Malachy Chamber, OTR/L Acute Rehab Services Office: [phone]   Layla Maw 02/27/2022, 8:53 AM

## 2022-02-27 NOTE — Discharge Instructions (Addendum)
Hold your Eliquis till you are cleared to take it by the gastroenterologist or your family physician  Follow with Primary MD Ria Bush, MD in 3 days   Get CBC, CMP, Magnesium, TSH, 2 view Chest X ray -  checked next visit within 3 days by Primary MD    Activity: As tolerated with Full fall precautions use walker/cane & assistance as needed  Disposition Home   Diet: Heart Healthy with 1.5 L fluid restriction per day  Special Instructions: If you have smoked or chewed Tobacco  in the last 2 yrs please stop smoking, stop any regular Alcohol  and or any Recreational drug use.  On your next visit with your primary care physician please Get Medicines reviewed and adjusted.  Please request your Prim.MD to go over all Hospital Tests and Procedure/Radiological results at the follow up, please get all Hospital records sent to your Prim MD by signing hospital release before you go home.  If you experience worsening of your admission symptoms, develop shortness of breath, life threatening emergency, suicidal or homicidal thoughts you must seek medical attention immediately by calling 911 or calling your MD immediately  if symptoms less severe.  You Must read complete instructions/literature along with all the possible adverse reactions/side effects for all the Medicines you take and that have been prescribed to you. Take any new Medicines after you have completely understood and accpet all the possible adverse reactions/side effects.

## 2022-02-27 NOTE — Progress Notes (Signed)
Mobility Specialist Progress Note:   02/27/22 1000  Mobility  Activity  (chair level exercises)  Range of Motion/Exercises Active;All extremities  Activity Response Tolerated fair  Mobility Referral Yes  $Mobility charge 1 Mobility   Pt received in chair, c/o lethargy. Pt declining ambulation at this time, agreeable to chair level exercises. Pt quick to fatigue this am, left with all needs met.   Nelta Numbers Acute Rehab Secure Chat or Office Phone: [phone]

## 2022-02-28 DIAGNOSIS — K922 Gastrointestinal hemorrhage, unspecified: Secondary | ICD-10-CM | POA: Diagnosis not present

## 2022-02-28 LAB — CBC WITH DIFFERENTIAL/PLATELET
Abs Immature Granulocytes: 0.07 10*3/uL (ref 0.00–0.07)
Basophils Absolute: 0 10*3/uL (ref 0.0–0.1)
Basophils Relative: 0 %
Eosinophils Absolute: 0 10*3/uL (ref 0.0–0.5)
Eosinophils Relative: 0 %
HCT: 24.3 % — ABNORMAL LOW (ref 36.0–46.0)
Hemoglobin: 7.7 g/dL — ABNORMAL LOW (ref 12.0–15.0)
Immature Granulocytes: 2 %
Lymphocytes Relative: 10 %
Lymphs Abs: 0.3 10*3/uL — ABNORMAL LOW (ref 0.7–4.0)
MCH: 30.4 pg (ref 26.0–34.0)
MCHC: 31.7 g/dL (ref 30.0–36.0)
MCV: 96 fL (ref 80.0–100.0)
Monocytes Absolute: 0.4 10*3/uL (ref 0.1–1.0)
Monocytes Relative: 12 %
Neutro Abs: 2.5 10*3/uL (ref 1.7–7.7)
Neutrophils Relative %: 76 %
Platelets: 123 10*3/uL — ABNORMAL LOW (ref 150–400)
RBC: 2.53 MIL/uL — ABNORMAL LOW (ref 3.87–5.11)
RDW: 19.4 % — ABNORMAL HIGH (ref 11.5–15.5)
WBC: 3.3 10*3/uL — ABNORMAL LOW (ref 4.0–10.5)
nRBC: 0 % (ref 0.0–0.2)

## 2022-02-28 LAB — GLUCOSE, CAPILLARY: Glucose-Capillary: 89 mg/dL (ref 70–99)

## 2022-02-28 LAB — COMPREHENSIVE METABOLIC PANEL
ALT: 13 U/L (ref 0–44)
AST: 17 U/L (ref 15–41)
Albumin: 2.5 g/dL — ABNORMAL LOW (ref 3.5–5.0)
Alkaline Phosphatase: 45 U/L (ref 38–126)
Anion gap: 9 (ref 5–15)
BUN: 14 mg/dL (ref 8–23)
CO2: 29 mmol/L (ref 22–32)
Calcium: 8.1 mg/dL — ABNORMAL LOW (ref 8.9–10.3)
Chloride: 102 mmol/L (ref 98–111)
Creatinine, Ser: 1.24 mg/dL — ABNORMAL HIGH (ref 0.44–1.00)
GFR, Estimated: 43 mL/min — ABNORMAL LOW (ref >60)
Glucose, Bld: 96 mg/dL (ref 70–99)
Potassium: 3.9 mmol/L (ref 3.5–5.1)
Sodium: 140 mmol/L (ref 135–145)
Total Bilirubin: 1.3 mg/dL — ABNORMAL HIGH (ref 0.3–1.2)
Total Protein: 4.8 g/dL — ABNORMAL LOW (ref 6.5–8.1)

## 2022-02-28 LAB — PREPARE RBC (CROSSMATCH)

## 2022-02-28 LAB — MAGNESIUM: Magnesium: 1.7 mg/dL (ref 1.7–2.4)

## 2022-02-28 MED ORDER — SODIUM CHLORIDE 0.9% IV SOLUTION: Freq: Once | INTRAVENOUS | Status: DC

## 2022-02-28 MED ORDER — METHYLPREDNISOLONE SODIUM SUCC 40 MG IJ SOLR
40.0000 mg | INTRAMUSCULAR | Status: DC
  Administered 2022-02-28: 40 mg via INTRAVENOUS
  Filled 2022-02-28: qty 1

## 2022-02-28 MED ORDER — FUROSEMIDE 40 MG PO TABS
40.0000 mg | ORAL_TABLET | Freq: Once | ORAL | Status: AC
  Administered 2022-02-28: 40 mg via ORAL
  Filled 2022-02-28: qty 1

## 2022-02-28 MED ORDER — DIPHENHYDRAMINE HCL 50 MG/ML IJ SOLN
50.0000 mg | Freq: Once | INTRAMUSCULAR | Status: AC
  Administered 2022-02-28: 50 mg via INTRAVENOUS
  Filled 2022-02-28: qty 1

## 2022-02-28 NOTE — Progress Notes (Signed)
PROGRESS NOTE                                                                                                                                                                                                             Patient Demographics:    [name], is a 88 y.o. female, DOB - 12-20-1933, HFW:[id number]  Outpatient Primary MD for the patient is Ria Bush, MD    LOS - 28  Admit date - 02/13/2022    Chief Complaint  Patient presents with   Melena   Abnormal Lab       Brief Narrative (HPI from H&P)   88 y.o. female with PMH significant for HTN, HLD,  CAD, cardiomyopathy s/p ICD, COPD, pulm hypertension, A-fib on Eliquis, aortic stenosis, GI bleed with AVMs, CKD, hypothyroidism, OA, RCC s/p right nephrectomy, celiac and SMA stenosis. Patient was diagnosed with COVID 19 a week prior and had constant fatigue.  3 days prior she started having melena.  Blood work done by PCP showed low hemoglobin and low iron level and hence sent to ED on 10/11.  Patient was admitted to [facility].  GI was consulted.  She was transferred to my care on 02/26/2022 on day 13 of her hospital stay.    Subjective:   Patient in bed, appears comfortable, denies any headache, no fever, no chest pain or pressure, no shortness of breath , no abdominal pain. No new focal weakness.   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  She was seen by GI underwent EGD on 02/15/2022.  EGD showed 2 nonbleeding AVMs these were cauterized, she continued to have subsequent bleeding with dropping H&H requiring blood transfusions.    CT angiogram of the abdomen pelvis was obtained on 02/16/2022, this showed pancreatic mass  adjacent to duodenum, this was thought to be the likely source of bleeding.  CT further confirmed a 4.7 x 3.9 cm hypervascular pancreatic tumor at the pancreatic head/uncinate process.  This was most consistent with possible underlying pancreatic neuroendocrine tumor with mets found in pancreatic tail as well as liver.  Due to continued  bleeding and drop in H&H she was seen by IR and she underwent GDA/pancreatic duodenal artery embolization on 02/17/2022.  She does not have any frank bleeding but does seem to have some intermittent low-grade bleeding requiring another packed RBC transfusion on 02/28/2022.  I will not resume Eliquis till she sees GI in the outpatient setting on 03/04/2022 and is cleared by them to do so.  She was seen by GI and IR this admission plan is to do outpatient EUS on 03/04/2022.  Continue to monitor H&H, continue PPI.  Gently diurese to remove fluid overload and heme dilution and trend.   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  Transfusion reaction - Per previous documentations, patient had transfusion reaction 2 years ago and on 10/12 this hospitalization.  Premedicate with Tylenol, Benadryl and steroids during future transfusions.  Moderate bilateral pleural effusions - she underwent left-sided thoracentesis with 900 cc of fluid removed on 02/21/2022 followed by right-sided thoracentesis and 850 cc of fluid removed on 02/22/2022.  No diagnostic studies or cytology was ordered, I called lab on 02/26/2022 unfortunately they do not have any specimen stored.   Chronic diastolic CHF with a EF of 65%, chronic nonischemic cardiomyopathy requiring AICD.  Chronic pulmonary hypertension and mild aortic stenosis.  Is on high doses of diuretics at home currently on evidence of moderate to severe fluid overload commence diuresis with caution and monitor.  AKI on CKD 3b  - Baseline creatinine 1.6.   Developed AKI due to blood loss, after transfusion AKI has resolved monitor with diuresis.   Paroxysmal atrial fibrillation.  Mali vas 2 score of greater than 3 - Eliquis is on hold due to GI bleed and pending EUS. Will resume once hemoglobin stabilizes and cleared by GI from the EUS standpoint.    CAD, HLD, Celiac and SMA stenosis.  Continue Crestor and Zetia for secondary prevention,   Recent COVID-19 infection with possible pneumonia - Completed 5-day course of IV Zosyn, Respiratory status stable.Continue Robitussin as needed, albuterol as needed.  Requested to sit up in chair use I-S and flutter valve for pulmonary toiletry.  On home oxygen.   Hypothyroidism - Synthroid   RCC  - s/p right nephrectomy        Condition - Extremely Guarded  Family Communication  :  daughter [name] on 02/26/22  Code Status :  DNR  Consults  :  GI, Pall Care  PUD Prophylaxis : PPI   Procedures  :      10/12, while getting blood transfusion, patient started having rigors, hypoxia raising concern of transfusion reaction.  Transfusion was stopped, she was given IV Solu-Medrol.  She also developed hypotension and fever, antibiotics was started.  She complained of chest pain, and had troponin elevated. 10/13, underwent EGD that showed 2 nonbleeding AVMs s/p cautery and enteroscopy 10/14, hemoglobin continued to drop to 7.1 requiring blood transfusion transferred to ICU in light of transfusion reaction 2 days ago. 10/14, CT angio abdomen pelvis showed a new 4.7 x 3.9 cm hypervascular tumor in the pancreatic head/uncinate process, most consistent with neuroendocrine tumor.  It is adjacent to duodenum and was likely to remain to be the source of GI bleeding.  Likely metastases were found in the pancreatic tail as well as liver. 10/15, s/p angiogram and GDA/pancreaticoduodenal artery embolization by IR 10/17, after clinical stabilization, patient was transferred out to [facility] service. 02/20/2022, 02/23/2022.  1  unit of packed RBC each See below for details. Underwent right and left sided ultrasound-guided thoracentesis on 02/21/2022 on 02/22/2022.  No studies were ordered.      Disposition Plan  :    Status is: Inpatient  DVT Prophylaxis  :    heparin injection 5,000 Units Start: 02/26/22 0945 Place TED hose Start: 02/26/22 0839 SCDs Start: 02/13/22 1923     Lab Results  Component Value Date   PLT 123 (L) 02/28/2022    Diet :  Diet Order             Diet regular Room service appropriate? Yes; Fluid consistency: Thin  Diet effective now                    Inpatient Medications  Scheduled Meds:  sodium chloride   Intravenous Once   Chlorhexidine Gluconate Cloth  6 each Topical Daily   diphenhydrAMINE  50 mg Intravenous Once   ezetimibe  10 mg Oral Daily   guaiFENesin  1,200 mg Oral BID   heparin injection (subcutaneous)  5,000 Units Subcutaneous Q8H   lidocaine  5 mL Intradermal Once   methylPREDNISolone (SOLU-MEDROL) injection  40 mg Intravenous Q24H   pantoprazole  40 mg Oral BID   rosuvastatin  40 mg Oral Daily   senna-docusate  1 tablet Oral BID   Continuous Infusions: PRN Meds:.acetaminophen **OR** acetaminophen, albuterol, guaiFENesin-dextromethorphan, metoCLOPramide (REGLAN) injection, ondansetron (ZOFRAN) IV, mouth rinse  Antibiotics  :    Anti-infectives (From admission, onward)    Start     Dose/Rate Route Frequency Ordered Stop   02/14/22 1400  piperacillin-tazobactam (ZOSYN) IVPB 3.375 g        3.375 g 12.5 mL/hr over 240 Minutes Intravenous Every 8 hours 02/14/22 1245 02/16/22 0702         Objective:   Vitals:   02/27/22 1904 02/28/22 0004 02/28/22 0341 02/28/22 0816  BP: (!) 112/45 (!) 116/48 114/62 (!) 117/49  Pulse: 70 62 60 60  Resp: '20 18 20 18  '$ Temp: 98.3 F (36.8 C) 98.2 F (36.8 C) 98.3 F (36.8 C) 98 F (36.7 C)  TempSrc: Oral Oral Oral Oral  SpO2: 98% 94% 95% 96%  Weight:      Height:        Wt Readings from Last 3  Encounters:  02/13/22 64.4 kg  02/12/22 64.4 kg  01/09/22 66.4 kg     Intake/Output Summary (Last 24 hours) at 02/28/2022 1031 Last data filed at 02/27/2022 1905 Gross per 24 hour  Intake --  Output 650 ml  Net -650 ml     Physical Exam  Awake Alert, No new F.N deficits, Normal affect [place].AT,PERRAL Supple Neck, No JVD,   Symmetrical Chest wall movement, Good air movement bilaterally, +ve rales RRR,No Gallops, Rubs or new Murmurs,  +ve B.Sounds, Abd Soft, No tenderness,   Trace edema       Data Review:    CBC Recent Labs  Lab 02/24/22 0214 02/25/22 0406 02/26/22 0637 02/27/22 0756 02/28/22 0545  WBC 3.9* 4.7 4.1 3.6* 3.3*  HGB 8.5* 8.1* 8.0* 8.1* 7.7*  HCT 26.2* 25.3* 25.1* 25.8* 24.3*  PLT 172 151 139* 138* 123*  MCV 92.6 94.4 95.8 96.3 96.0  MCH 30.0 30.2 30.5 30.2 30.4  MCHC 32.4 32.0 31.9 31.4 31.7  RDW 17.0* 17.7* 18.3* 18.6* 19.4*  LYMPHSABS 0.2* 0.3* 0.2* 0.3* 0.3*  MONOABS 0.2 0.4 0.3 0.3 0.4  EOSABS 0.0 0.0 0.0 0.0 0.0  BASOSABS 0.0  0.0 0.0 0.0 0.0    Electrolytes Recent Labs  Lab 02/24/22 0214 02/25/22 0406 02/26/22 0637 02/27/22 0756 02/28/22 0545  NA 140 141 139 138 140  K 5.2* 4.3 4.0 4.1 3.9  CL 106 106 105 103 102  CO2 '27 25 28 29 29  '$ GLUCOSE 145* 88 91 116* 96  BUN 36* 29* '19 15 14  '$ CREATININE 1.35* 1.23* 1.07* 1.25* 1.24*  CALCIUM 8.4* 8.3* 8.4* 8.0* 8.1*  AST  --   --   --  18 17  ALT  --   --   --  16 13  ALKPHOS  --   --   --  44 45  BILITOT  --   --   --  1.6* 1.3*  ALBUMIN  --   --   --  2.5* 2.5*  MG  --   --   --  1.9 1.7   Radiology Reports No results found.   Signature  Lala Lund M.D on 02/28/2022 at 10:31 AM   -  To page go to [email or web address]

## 2022-02-28 NOTE — Plan of Care (Signed)
  Problem: Clinical Measurements: Goal: Ability to maintain clinical measurements within normal limits will improve Outcome: Progressing   Problem: Activity: Goal: Risk for activity intolerance will decrease Outcome: Progressing   Problem: Nutrition: Goal: Adequate nutrition will be maintained Outcome: Progressing   Problem: Coping: Goal: Level of anxiety will decrease Outcome: Progressing   Problem: Pain Managment: Goal: General experience of comfort will improve Outcome: Progressing   

## 2022-02-28 NOTE — Progress Notes (Signed)
Patient ID: LORALIE MALTA, female   DOB: 1933-09-29, 88 y.o.   MRN: [id number]    Progress Note from the Palliative Medicine Team at [facility]   Patient Name: [name]        Date: 02/28/2022 DOB: [birth year]  Age: 88 y.o. MRN#: [id number] Attending Physician: Thurnell Lose, MD Primary Care Physician: Ria Bush, MD Admit Date: 02/13/2022   Medical records reviewed, assessed the patient  This NP assessed patient at the bedside as a follow up to for palliative medicine needs and emotional support.     Patient is OOB in chair, she is weak.  88 y.o. female  admitted on 02/13/2022 with PMH significant for HTN, HLD,  CAD, cardiomyopathy s/p ICD, COPD, pulm hypertension, A-fib on Eliquis, aortic stenosis, GI bleed with AVMs, CKD, hypothyroidism, OA, RCC s/p right nephrectomy, celiac and SMA stenosis.   Patient was diagnosed with COVID 19 a week prior and had constant fatigue.  3 days prior she started having melena.  Blood work done by PCP showed low hemoglobin and low iron level and hence sent to ED on 10/11.GI was consulted. Received blood transfusion.    10/12, while getting blood transfusion, patient started having rigors, hypoxia raising concern of transfusion reaction.  Transfusion was stopped, she was given IV Solu-Medrol.  She also developed hypotension and fever, antibiotics was started.  She complained of chest pain, and had troponin elevated.   10/13, underwent EGD that showed 2 nonbleeding AVMs s/p cautery and enteroscopy  10/14, hemoglobin continued to drop to 7.1 requiring blood transfusion transferred to ICU in light of transfusion reaction 2 days ago.   10/14, CT angio abdomen pelvis showed a new 4.7 x 3.9 cm hypervascular tumor in the pancreatic head/uncinate process, most consistent with neuroendocrine tumor.  It is adjacent to duodenum and was likely to remain to be the source of GI bleeding.  Likely metastases were found in the pancreatic tail as  well as liver.   10/15, s/p angiogram and GDA/pancreaticoduodenal artery embolization by IR   10/17, after clinical stabilization, patient was transferred out to [facility] service.   10/20 right ultrasound-guided thoracentesis for 425 mL   Very complicated hospital stay, patient is high risk for decompensation   Patient and family face ongoing treatment option decisions, advanced directive decisions and anticipatory care needs.   I met at bedside with patient and her daughter/[name].   Daughter expresses appreciation for palliative medicine GOCs  conversation yesterday.  Patient verbalizes feeling tired.  Plan continues with decision and hope for medical stabilization, dc home and OP follow up.     Education offered today regarding  the importance of continued conversation with family and the medical providers regarding overall plan of care and treatment options,  ensuring decisions are within the context of the patients values and GOCs.  I worry that patient's poor physical/functional status will inhibit viable medical treatments into the future.   PMT will continue to support holistically   Wadie Lessen NP  Palliative Medicine Team Team Phone # [phone] Pager [phone]

## 2022-02-28 NOTE — Progress Notes (Signed)
PT Cancellation Note  Patient Details Name: [name] MRN: [id number] DOB: 09-01-1933   Cancelled Treatment:    Reason Eval/Treat Not Completed: Other (comment). Pt refused x 2 this AM due to "having just gotten back down". Pt is up in the recliner. Will continue attempts at later date.   Shary Decamp [facility] 02/28/2022, 12:18 PM [place] Office [phone]

## 2022-03-01 ENCOUNTER — Other Ambulatory Visit (HOSPITAL_COMMUNITY): Payer: Self-pay

## 2022-03-01 DIAGNOSIS — K922 Gastrointestinal hemorrhage, unspecified: Secondary | ICD-10-CM | POA: Diagnosis not present

## 2022-03-01 LAB — CBC WITH DIFFERENTIAL/PLATELET
Abs Immature Granulocytes: 0.05 10*3/uL (ref 0.00–0.07)
Basophils Absolute: 0 10*3/uL (ref 0.0–0.1)
Basophils Relative: 0 %
Eosinophils Absolute: 0 10*3/uL (ref 0.0–0.5)
Eosinophils Relative: 0 %
HCT: 29.2 % — ABNORMAL LOW (ref 36.0–46.0)
Hemoglobin: 9.3 g/dL — ABNORMAL LOW (ref 12.0–15.0)
Immature Granulocytes: 1 %
Lymphocytes Relative: 10 %
Lymphs Abs: 0.4 10*3/uL — ABNORMAL LOW (ref 0.7–4.0)
MCH: 30.2 pg (ref 26.0–34.0)
MCHC: 31.8 g/dL (ref 30.0–36.0)
MCV: 94.8 fL (ref 80.0–100.0)
Monocytes Absolute: 0.4 10*3/uL (ref 0.1–1.0)
Monocytes Relative: 10 %
Neutro Abs: 2.7 10*3/uL (ref 1.7–7.7)
Neutrophils Relative %: 79 %
Platelets: 142 10*3/uL — ABNORMAL LOW (ref 150–400)
RBC: 3.08 MIL/uL — ABNORMAL LOW (ref 3.87–5.11)
RDW: 20.5 % — ABNORMAL HIGH (ref 11.5–15.5)
WBC: 3.5 10*3/uL — ABNORMAL LOW (ref 4.0–10.5)
nRBC: 0 % (ref 0.0–0.2)

## 2022-03-01 LAB — COMPREHENSIVE METABOLIC PANEL
ALT: 13 U/L (ref 0–44)
AST: 21 U/L (ref 15–41)
Albumin: 2.5 g/dL — ABNORMAL LOW (ref 3.5–5.0)
Alkaline Phosphatase: 46 U/L (ref 38–126)
Anion gap: 7 (ref 5–15)
BUN: 18 mg/dL (ref 8–23)
CO2: 30 mmol/L (ref 22–32)
Calcium: 8.3 mg/dL — ABNORMAL LOW (ref 8.9–10.3)
Chloride: 102 mmol/L (ref 98–111)
Creatinine, Ser: 1.29 mg/dL — ABNORMAL HIGH (ref 0.44–1.00)
GFR, Estimated: 41 mL/min — ABNORMAL LOW (ref >60)
Glucose, Bld: 110 mg/dL — ABNORMAL HIGH (ref 70–99)
Potassium: 4.2 mmol/L (ref 3.5–5.1)
Sodium: 139 mmol/L (ref 135–145)
Total Bilirubin: 0.9 mg/dL (ref 0.3–1.2)
Total Protein: 5.1 g/dL — ABNORMAL LOW (ref 6.5–8.1)

## 2022-03-01 LAB — TYPE AND SCREEN
ABO/RH(D): A NEG
Antibody Screen: POSITIVE
DAT, IgG: POSITIVE
Unit division: 0

## 2022-03-01 LAB — BPAM RBC
Blood Product Expiration Date: 202311172359
ISSUE DATE / TIME: 202310261438
Unit Type and Rh: 9500

## 2022-03-01 LAB — MAGNESIUM: Magnesium: 1.7 mg/dL (ref 1.7–2.4)

## 2022-03-01 MED ORDER — POTASSIUM CHLORIDE CRYS ER 20 MEQ PO TBCR
20.0000 meq | EXTENDED_RELEASE_TABLET | Freq: Every day | ORAL | 0 refills | Status: DC
  Filled 2022-03-01: qty 30, 30d supply, fill #0

## 2022-03-01 MED ORDER — TORSEMIDE 20 MG PO TABS
40.0000 mg | ORAL_TABLET | Freq: Every day | ORAL | 0 refills | Status: DC
  Filled 2022-03-01: qty 60, 30d supply, fill #0

## 2022-03-01 MED ORDER — APIXABAN 2.5 MG PO TABS: 2.5000 mg | ORAL_TABLET | Freq: Two times a day (BID) | ORAL | 0 refills | Status: DC

## 2022-03-01 MED ORDER — FUROSEMIDE 10 MG/ML IJ SOLN
40.0000 mg | Freq: Once | INTRAMUSCULAR | Status: AC
  Administered 2022-03-01: 40 mg via INTRAVENOUS
  Filled 2022-03-01: qty 4

## 2022-03-01 MED ORDER — METHYLPREDNISOLONE SODIUM SUCC 40 MG IJ SOLR
10.0000 mg | INTRAMUSCULAR | Status: DC
  Administered 2022-03-01: 10 mg via INTRAVENOUS
  Filled 2022-03-01: qty 1

## 2022-03-01 MED ORDER — ONDANSETRON HCL 4 MG PO TABS
4.0000 mg | ORAL_TABLET | Freq: Three times a day (TID) | ORAL | 0 refills | Status: DC | PRN
  Filled 2022-03-01: qty 20, 7d supply, fill #0

## 2022-03-01 MED ORDER — PANTOPRAZOLE SODIUM 40 MG PO TBEC
40.0000 mg | DELAYED_RELEASE_TABLET | Freq: Two times a day (BID) | ORAL | 0 refills | Status: DC
  Filled 2022-03-01: qty 60, 30d supply, fill #0

## 2022-03-01 NOTE — Discharge Summary (Signed)
[name] BTY:[id number] DOB: October 12, 1933 DOA: 02/13/2022  PCP: Ria Bush, MD  Admit date: 02/13/2022  Discharge date: 03/01/2022  Admitted From: Home   Disposition:  Home - refused SNF   Recommendations for Outpatient Follow-up:   Follow up with PCP in 1-2 weeks  PCP Please obtain BMP/CBC, 2 view CXR in 1week,  (see Discharge instructions)   PCP Please follow up on the following pending results: Monitor BMP, magnesium, CBC and blood pressure closely.  Monitor diuretic dose closely.   Home Health: PT, RN   Equipment/Devices: as below  Consultations: GI, Pall Care Discharge Condition: Stable    CODE STATUS: Full    Diet Recommendation: Heart Healthy     Chief Complaint  Patient presents with   Melena   Abnormal Lab     Brief history of present illness from the day of admission and additional interim summary    88 y.o. female with PMH significant for HTN, HLD,  CAD, cardiomyopathy s/p ICD, COPD, pulm hypertension, A-fib on Eliquis, aortic stenosis, GI bleed with AVMs, CKD, hypothyroidism, OA, RCC s/p right nephrectomy, celiac and SMA stenosis. Patient was diagnosed with COVID 19 a week prior and had constant fatigue.  3 days prior she started having melena.  Blood work done by PCP showed low hemoglobin and low iron level and hence sent to ED on 10/11.  Patient was admitted to [facility].  GI was consulted.                                                                    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  She was seen by GI underwent EGD on 02/15/2022.   EGD showed 2 nonbleeding AVMs these were cauterized, she continued to have subsequent bleeding with dropping H&H requiring blood transfusions.     CT angiogram of the abdomen pelvis was obtained on 02/16/2022, this showed pancreatic mass adjacent to duodenum, this was thought to be the likely source of bleeding.  CT further confirmed a 4.7 x 3.9 cm hypervascular pancreatic tumor at the pancreatic head/uncinate process.  This was most consistent with possible underlying pancreatic neuroendocrine tumor with mets found in pancreatic tail as well as liver.   Due to continued bleeding and drop in H&H she was seen by IR and she underwent GDA/pancreatic duodenal artery embolization on 02/17/2022.  She does not have any frank bleeding but does seem to have some intermittent low-grade bleeding requiring another packed RBC transfusion on 02/28/2022.  I will not resume Eliquis till she sees GI  in the outpatient setting on 03/04/2022 and is cleared by them to do so.   She was seen by GI and IR this admission plan is to do outpatient EUS on 03/04/2022.  Today stable post transfusion on 02/28/2022, eager to go home, refused SNF will be discharged home on oral PPI twice daily with close outpatient follow-up by PCP and coming appointment with GI on the 30th of this month which is 3 days from now.  GI and PCP to decide on resuming Eliquis.     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   History of blood transfusion reaction -   Premedicate with Tylenol, Benadryl and steroids during future transfusions.   Moderate bilateral pleural effusions - she underwent left-sided thoracentesis with 900 cc of fluid removed on 02/21/2022 followed by right-sided thoracentesis and 850 cc of fluid removed on 02/22/2022.  No diagnostic studies or cytology was ordered, I called lab on 02/26/2022 unfortunately they do not have any specimen stored.    Chronic diastolic CHF with a EF of 65%, chronic nonischemic cardiomyopathy requiring AICD.  Chronic pulmonary hypertension and mild aortic stenosis.  Is on high doses of diuretics at home, requiring much lower doses here, have adjusted her diuretics dose upon discharge PCP to monitor closely.   AKI on CKD 3b  - Baseline creatinine 1.6.  Developed AKI due to blood loss, after transfusion AKI has resolved monitor with diuresis.   Paroxysmal atrial fibrillation.  Mali vas 2 score of greater than 3 - Eliquis is on hold due to GI bleed and pending EUS.   CAD, HLD, Celiac and SMA stenosis.  Continue Crestor and Zetia for secondary prevention,   Recent COVID-19 infection with possible pneumonia - Completed 5-day course of IV Zosyn, Respiratory status stable.Continue Robitussin as needed, albuterol as needed.  Requested to sit up in chair use I-S and flutter valve for pulmonary toiletry.  On home oxygen.  Finished short course of steroids as well.  Clinically resolved now.   Hypothyroidism - Synthroid   RCC  - s/p right nephrectomy     Discharge diagnosis     Principal Problem:   Acute upper gastrointestinal bleeding Active Problems:   Hypothyroidism   Mixed hyperlipidemia   Permanent atrial fibrillation   COPD (chronic obstructive pulmonary disease) (HCC)   Chronic kidney disease, stage 3b (HCC)   ICD (implantable cardioverter-defibrillator) in place   Hypertension   Chronic diastolic CHF (congestive heart failure) (HCC)   Anticoagulated   Hypokalemia   Acute blood loss anemia   Symptomatic anemia   Melena   COVID-19 virus infection   GI bleed   Pancreatic mass    Discharge instructions    Discharge Instructions     Discharge instructions   Complete by: As directed    Hold your Eliquis till you are cleared to take it by the gastroenterologist or your family physician  Follow with Primary MD Ria Bush, MD in 3 days   Get CBC, CMP, Magnesium, TSH, 2 view Chest X  ray -  checked next visit within 3 days by Primary MD    Activity: As tolerated with Full fall precautions use walker/cane & assistance as needed  Disposition Home   Diet: Heart Healthy with 1.5 L fluid restriction per day  Special Instructions: If you have smoked or chewed Tobacco  in the last 2 yrs please stop smoking, stop any regular Alcohol  and or any Recreational drug use.  On your next visit with your primary care physician please Get Medicines reviewed and adjusted.  Please request your Prim.MD to go over all Hospital Tests and Procedure/Radiological results at the follow up, please get all Hospital records sent to your Prim MD by signing hospital release before you go home.  If you experience worsening of your admission symptoms, develop shortness of breath, life threatening emergency, suicidal or homicidal thoughts you must seek medical attention immediately by calling 911 or calling your MD immediately  if symptoms less severe.  You Must read complete instructions/literature along with all the possible adverse reactions/side effects for all the Medicines you take and that have been prescribed to you. Take any new Medicines after you have completely understood and accpet all the possible adverse reactions/side effects.   Increase activity slowly   Complete by: As directed    No wound care   Complete by: As directed        Discharge Medications   Allergies as of 03/01/2022       Reactions   Dilaudid [hydromorphone] Other (See Comments)   Excessive Somnolence- Required Narcan   Fentanyl Other (See Comments)   Required Narcan when given with Dilaudid. Tolerated single dose when given during procedure 02/03/2019.    Codeine Nausea And Vomiting   Hallucinations    Morphine And Related Nausea And Vomiting   Hallucinations    Hydrocodone Nausea Only, Other (See Comments)   Nausea and lethargy   Oxycodone Nausea Only, Other (See Comments)   Nausea and lethargy   Tramadol  Nausea Only, Other (See Comments)   Nausea and lethargy   Whole Blood Itching, Nausea And Vomiting   Pt and family stated pt will receive blood products but had a reaction requiring benadryl on a previous administration.    Sulfonamide Derivatives Other (See Comments)   Dry mouth        Medication List     STOP taking these medications    metolazone 2.5 MG tablet Commonly known as: ZAROXOLYN   octreotide 10 MG injection Commonly known as: SANDOSTATIN LAR       TAKE these medications    acetaminophen 500 MG tablet Commonly known as: TYLENOL Take 500-1,000 mg by mouth every 8 (eight) hours as needed for mild pain or headache.   albuterol 108 (90 Base) MCG/ACT inhaler Commonly known as: VENTOLIN HFA Inhale 2 puffs into the lungs every 6 (six) hours as needed for wheezing or shortness of breath.   apixaban 2.5 MG Tabs tablet Commonly known as: Eliquis Take 1 tablet (2.5 mg total) by mouth 2 (two) times daily. Start taking on: March 07, 2022 What changed:  how much to take These instructions start on March 07, 2022. If you are unsure what to do until then, ask your doctor or other care provider.   B-12 PO Take 1 tablet by mouth daily.   clobetasol cream 0.05 % Commonly known as: TEMOVATE Apply to affected area left foot twice a day until improved. Avoid face, groin, axilla.   fluticasone 50 MCG/ACT nasal spray Commonly known as: FLONASE Place 2 sprays into both nostrils daily as needed for allergies or rhinitis.   ondansetron 4 MG tablet Commonly known as: Zofran Take 1 tablet (4 mg total) by mouth every 8 (eight) hours as needed for nausea or vomiting.   pantoprazole 40 MG tablet Commonly known as: PROTONIX Take 1 tablet (40 mg total) by mouth 2 (two) times daily.  potassium chloride SA 20 MEQ tablet Commonly known as: KLOR-CON M Take 1 tablet (20 mEq total) by mouth daily. What changed: See the new instructions.   Torsemide 40 MG Tabs Take 40 mg by  mouth daily. What changed:  medication strength See the new instructions.   vitamin C 250 MG tablet Commonly known as: ASCORBIC ACID Take 500 mg by mouth daily.   VITAMIN D PO Take 4,000 Units by mouth daily.               Durable Medical Equipment  (From admission, onward)           Start     Ordered   02/26/22 1155  For home use only DME Bedside commode  Once       Question:  Patient needs a bedside commode to treat with the following condition  Answer:  Weakness   02/26/22 1154   02/21/22 1509  For home use only DME 3 n 1  Once        02/21/22 1512   02/21/22 1506  For home use only DME lightweight manual wheelchair with seat cushion  Once       Comments: Patient suffers from weakness and decreased activity tolerance which impairs their ability to perform daily activities like bathing, dressing, and toileting in the home.  A walker will not resolve  issue with performing activities of daily living. A wheelchair will allow patient to safely perform daily activities. Patient is not able to propel themselves in the home using a standard weight wheelchair due to general weakness. Length of need 6 months . Accessories: elevating leg rests (ELRs), wheel locks, extensions and anti-tippers.   02/21/22 [place] Follow up.   Specialty: Home Health Services Contact information: [address] [place] [address] [phone]         Ria Bush, MD. Schedule an appointment as soon as possible for a visit in 2 day(s).   Specialty: Family Medicine Contact information: [place] [address] [place]  [facility]. Schedule an appointment as soon as possible for a visit.   Specialty: Gastroenterology Contact information: [address] [place] [phone] [phone]                 Major procedures and Radiology Reports - PLEASE review detailed and final reports thoroughly  -       IR THORACENTESIS ASP PLEURAL SPACE W/IMG GUIDE  Result Date: 02/22/2022 INDICATION: Patient with history of shortness of breath, found to have bilateral pleural effusions. Request is for therapeutic right-sided thoracentesis EXAM: ULTRASOUND GUIDED RIGHT-SIDED THERAPEUTIC THORACENTESIS MEDICATIONS: None. COMPLICATIONS: None immediate. PROCEDURE: An ultrasound guided thoracentesis was thoroughly discussed with the patient and questions answered. The benefits, risks, alternatives and complications were also discussed. The patient understands and wishes to proceed with the procedure. Written consent was obtained. Ultrasound was performed to localize and mark an adequate pocket of fluid in the right chest. The area was then prepped and draped in the normal sterile fashion. 1% Lidocaine was used for local anesthesia. Under ultrasound guidance a 6 Fr Safe-T-Centesis catheter was introduced. Thoracentesis was performed. The catheter was removed and a dressing applied. FINDINGS: A total of approximately 850 mL of straw-colored fluid was removed. IMPRESSION: Successful ultrasound guided  right-sided therapeutic thoracentesis yielding 850 mL of pleural fluid. Read by: Rushie Nyhan, NP Electronically Signed   By: Albin Felling M.D.   On: 02/22/2022 16:42   DG Chest 1 View  Result Date: 02/22/2022 CLINICAL DATA:  Post right thoracentesis EXAM: CHEST  1 VIEW COMPARISON:  02/21/2022 FINDINGS: Left-sided implanted cardiac device remains in place. Stable heart size. Aortic atherosclerosis. Decreased right-sided pleural effusion. No significant residual right-sided effusion. Trace left pleural effusion. No pneumothorax. IMPRESSION: Decreased right-sided pleural effusion following thoracentesis. No pneumothorax. Electronically Signed   By: Davina Poke D.O.   On: 02/22/2022 16:00   IR THORACENTESIS ASP  PLEURAL SPACE W/IMG GUIDE  Result Date: 02/21/2022 INDICATION: Shortness breath, previous chest x-ray showed bilateral pleural effusion. Request for therapeutic thoracentesis. EXAM: ULTRASOUND GUIDED LEFT THORACENTESIS MEDICATIONS: 10 mL 1% lidocaine COMPLICATIONS: None immediate. PROCEDURE: An ultrasound guided thoracentesis was thoroughly discussed with the patient and questions answered. The benefits, risks, alternatives and complications were also discussed. The patient understands and wishes to proceed with the procedure. Written consent was obtained. Ultrasound was performed to localize and mark an adequate pocket of fluid in the left chest. The area was then prepped and draped in the normal sterile fashion. 1% Lidocaine was used for local anesthesia. Under ultrasound guidance a 6 Fr Safe-T-Centesis catheter was introduced. Thoracentesis was performed. The catheter was removed and a dressing applied. FINDINGS: A total of approximately 900 mL of hazy amber fluid was removed. Post procedure chest X-ray reviewed, negative for pneumothorax. IMPRESSION: Successful ultrasound guided left thoracentesis yielding 900 mL of pleural fluid. Read by: Durenda Guthrie, PA-C Electronically Signed   By: Corrie Mckusick D.O.   On: 02/21/2022 17:00   DG Chest 1 View  Result Date: 02/21/2022 CLINICAL DATA:  Shortness of breath.  Left-sided thoracentesis EXAM: CHEST  1 VIEW COMPARISON:  02/20/2022 FINDINGS: Left-sided implanted cardiac device remains in place. Stable heart size. Aortic atherosclerosis. Decreased left-sided pleural effusion. No significant residual left-sided effusion. Persistent small right pleural effusion. Improving aeration of the lung bases. No pneumothorax. IMPRESSION: Decreased left-sided pleural effusion status post thoracentesis. No pneumothorax. Electronically Signed   By: Davina Poke D.O.   On: 02/21/2022 14:08   DG Chest 2 View  Result Date: 02/20/2022 CLINICAL DATA:  [id number] with shortness of  breath. EXAM: CHEST - 2 VIEW COMPARISON:  Portable chest 02/14/2022 FINDINGS: PA Lat, 4:36 a.m. Left chest AICD is again noted with 2 leads terminating in the right ventricle and 1 in the right atrium. The heart is enlarged. There is increased central vascular prominence, and increased generalized interstitial edema with a basal gradient, with increased small to moderate underlying pleural effusions. There is increased haziness in both lower lung fields which could be atelectasis, consolidation or ground-glass edema. The upper lung fields do not show focal infiltrates. There are scattered calcified granulomas. Thoracic aorta is heavily calcified with stable mediastinum. Osteopenia. IMPRESSION: Cardiomegaly with interval worsening findings of CHF or fluid overload. There is increased mild-to-moderate interstitial edema with basal gradient, increased small or moderate pleural effusions, and increased hazy interstitial consolidation in the lung bases. Aortic atherosclerosis and remaining findings described above. Electronically Signed   By: Telford Nab M.D.   On: 02/20/2022 05:48   IR Angiogram Visceral Selective  Result Date: 02/17/2022 INDICATION: 88 year old female with chronic, worsening upper gastrointestinal hemorrhage with hypervascular pancreatic head mass visualized on CT earlier today, concerning for neuroendocrine tumor. EXAM: 1. Ultrasound-guided vascular access of the right common femoral artery. 2. Catheterization and angiography  of the superior mesenteric and celiac arteries 3. Sub selective catheterization angiography of the inferior pancreaticoduodenal arcade and gastroduodenal arteries 4. Coil embolization of the inferior pancreaticoduodenal arcade and gastroduodenal artery. MEDICATIONS: None. ANESTHESIA/SEDATION: Moderate (conscious) sedation was employed during this procedure. A total of Versed 0 mg and Fentanyl 12.5 mcg was administered intravenously. Moderate Sedation Time: 196 minutes.  The patient's level of consciousness and vital signs were monitored continuously by radiology nursing throughout the procedure under my direct supervision. CONTRAST:  88m OMNIPAQUE IOHEXOL 300 MG/ML SOLN, 1056mOMNIPAQUE IOHEXOL 300 MG/ML SOLN, 4028mMNIPAQUE IOHEXOL 300 MG/ML SOLN, 77m14mNIPAQUE IOHEXOL 300 MG/ML SOLN FLUOROSCOPY: Radiation Exposure Index (as provided by the fluoroscopic device): 2,847,124 [place] COMPLICATIONS: None immediate. PROCEDURE: Informed consent was obtained from the patient following explanation of the procedure, risks, benefits and alternatives. The patient understands, agrees and consents for the procedure. All questions were addressed. A time out was performed prior to the initiation of the procedure. Maximal barrier sterile technique utilized including caps, mask, sterile gowns, sterile gloves, large sterile drape, hand hygiene, and Betadine prep. Preprocedure ultrasound evaluation of the right common femoral artery demonstrated patency. The procedure was planned. Subdermal Local anesthesia was provided at the planned needle entry site. A small skin nick was made with an 11 scalpel. Under direct ultrasound visualization, the right common femoral artery was punctured with a 21 gauge micropuncture needle. A permanent ultrasound image was captured and stored in the record. A microwire was introduced without difficulty. The needle was exchanged for a micropuncture sheath. Limited right lower extremity angiogram demonstrated adequate puncture site for closure device use. A J wire was directed to the level of the abdominal aorta. The micropuncture sheath was exchanged for a 5 [place]ren[place] cm vascular sheath. A 5 French, C2 catheter was directed to the level of the superior mesenteric artery, however was unable to engage the ostium. Therefore, the catheter was exchanged over the wire for a Mickelson catheter which was used to select the superior mesenteric artery ostium. Superior mesenteric  angiogram was performed which demonstrated patency of the superior mesenteric artery with hypervascular mass in the region of the pancreatic head which measured approximately 4.2 x 4.1 cm in frontal projection diameters. The mass vascular supply appeared to arise from a proximal branch of the superior mesenteric artery compatible with the inferior pancreaticoduodenal arcade. There is no definite evidence of active extravasation into the gastrointestinal lumen. A Progreat Omega microcatheter and fathom 16 microwire were used to attempt to select the inferior pancreaticoduodenal arcade, however this was very challenging due to angulation and position of the base catheter in the SMA. A combination of catheters and wires were attempted which was eventually successful with a 5 [place]ren[place] catheter in the SMA, a 2.5 Fren[place]ata microcatheter, and 0.018 inch double angle glide GT wire. Angiogram of the inferior pancreaticoduodenal arcade was performed which demonstrated primary supply to the inferior aspect of the spherical pancreatic head mass. Again, no evidence of definite gastric extravasation into the duodenal lumen was observed. There was early venous outflow via peripheral portal vein branches concerning for possible arteriovenous shunting. An assortment of Penumbra Ruby microcoils were then used to embolize the distal branches of the inferior pancreticoduodenal arcade, preserving flow to a proximal duodenal branch arising from the parent vessel. Repeat angiogram from proximal in the pancreaticoduodenal parent branch demonstrated complete embolization of the inferior aspect of the mass. The microcatheter was then removed and selection of the celiac trunk was pursued. A 5 Fren[place]  Mickelson catheter was engaged in the celiac trunk and celiac angiogram was performed. Angiogram was significant for patent splenic and common hepatic arteries with supply arising from the gastroduodenal artery to the more superior aspect  of the spherical mass. Again, no evidence of active gastrointestinal hemorrhage was observed. The gastroduodenal artery was then selected the Progreat microcatheter and fathom wire. Angiogram from the proximal duodenal artery demonstrated tumor blush arising from the superior pancreaticoduodenal branches. Therefore, coil embolization was performed of the distal gastroduodenal artery at the pancreaticoduodenal branch point with an assortment of Penumbra Ruby microcoils. Upon deployment of the final coil, a previously deployed coil became dislodged and traveled into the proximal proper hepatic artery. A micro snare was then used to attempt to capture the coil which was unsuccessful due to the snare being too small. Therefore, a Wholey wire was directed into the distal proper [place] artery. The base catheter and 5 French femoral sheath were removed. A 7 French, 55 cm tori guide sheath was then inserted and directed into the celiac ostium. A 6 [place] Ensnare was then used to capture and removed the displaced coil. Completion celiac angiogram demonstrated adequate embolization of the distal gastroduodenal artery without significant evidence of persistent enhancement about the superior aspect of the pancreatic head mass. Vasospasm was visualized in the right hepatic artery. The 7 French sheath was removed and exchanged for an 8 French Angio-Seal device which was deployed successfully to close the right common femoral artery. Distal pulses were unchanged. A sterile bandage was applied. The patient tolerated the procedure well was transferred back to the ICU in stable condition. IMPRESSION: 1. Hyperenhancing spherical mass about the pancreatic head with vascular supply from the superior and inferior pancreaticoduodenal arcades. No evidence of active gastrointestinal hemorrhage. 2. Given that this mass is the presumed etiology of gastrointestinal bleeding, coil embolization of the inferior and superior pancreatico duodenal  arteries was performed successfully. Ruthann Cancer, MD [facility] Radiology Electronically Signed   By: Ruthann Cancer M.D.   On: 02/17/2022 09:52   IR US Guide Vasc Access Right  Result Date: 02/17/2022 INDICATION: 88 year old female with chronic, worsening upper gastrointestinal hemorrhage with hypervascular pancreatic head mass visualized on CT earlier today, concerning for neuroendocrine tumor. EXAM: 1. Ultrasound-guided vascular access of the right common femoral artery. 2. Catheterization and angiography of the superior mesenteric and celiac arteries 3. Sub selective catheterization angiography of the inferior pancreaticoduodenal arcade and gastroduodenal arteries 4. Coil embolization of the inferior pancreaticoduodenal arcade and gastroduodenal artery. MEDICATIONS: None. ANESTHESIA/SEDATION: Moderate (conscious) sedation was employed during this procedure. A total of Versed 0 mg and Fentanyl 12.5 mcg was administered intravenously. Moderate Sedation Time: 196 minutes. The patient's level of consciousness and vital signs were monitored continuously by radiology nursing throughout the procedure under my direct supervision. CONTRAST:  60m OMNIPAQUE IOHEXOL 300 MG/ML SOLN, 1075mOMNIPAQUE IOHEXOL 300 MG/ML SOLN, 4021mMNIPAQUE IOHEXOL 300 MG/ML SOLN, 52m48mNIPAQUE IOHEXOL 300 MG/ML SOLN FLUOROSCOPY: Radiation Exposure Index (as provided by the fluoroscopic device): [phone],843,[id number] Kerma COMPLICATIONS: None immediate. PROCEDURE: Informed consent was obtained from the patient following explanation of the procedure, risks, benefits and alternatives. The patient understands, agrees and consents for the procedure. All questions were addressed. A time out was performed prior to the initiation of the procedure. Maximal barrier sterile technique utilized including caps, mask, sterile gowns, sterile gloves, large sterile drape, hand hygiene, and Betadine prep. Preprocedure  ultrasound evaluation of the right common femoral artery demonstrated patency. The procedure  was planned. Subdermal Local anesthesia was provided at the planned needle entry site. A small skin nick was made with an 11 scalpel. Under direct ultrasound visualization, the right common femoral artery was punctured with a 21 gauge micropuncture needle. A permanent ultrasound image was captured and stored in the record. A microwire was introduced without difficulty. The needle was exchanged for a micropuncture sheath. Limited right lower extremity angiogram demonstrated adequate puncture site for closure device use. A J wire was directed to the level of the abdominal aorta. The micropuncture sheath was exchanged for a 5 [place], 10 cm vascular sheath. A 5 French, C2 catheter was directed to the level of the superior mesenteric artery, however was unable to engage the ostium. Therefore, the catheter was exchanged over the wire for a Mickelson catheter which was used to select the superior mesenteric artery ostium. Superior mesenteric angiogram was performed which demonstrated patency of the superior mesenteric artery with hypervascular mass in the region of the pancreatic head which measured approximately 4.2 x 4.1 cm in frontal projection diameters. The mass vascular supply appeared to arise from a proximal branch of the superior mesenteric artery compatible with the inferior pancreaticoduodenal arcade. There is no definite evidence of active extravasation into the gastrointestinal lumen. A Progreat Omega microcatheter and fathom 16 microwire were used to attempt to select the inferior pancreaticoduodenal arcade, however this was very challenging due to angulation and position of the base catheter in the SMA. A combination of catheters and wires were attempted which was eventually successful with a 5 [place] RIM catheter in the SMA, a 2.5 [place] cantata microcatheter, and 0.018 inch double angle glide GT wire. Angiogram of  the inferior pancreaticoduodenal arcade was performed which demonstrated primary supply to the inferior aspect of the spherical pancreatic head mass. Again, no evidence of definite gastric extravasation into the duodenal lumen was observed. There was early venous outflow via peripheral portal vein branches concerning for possible arteriovenous shunting. An assortment of Penumbra Ruby microcoils were then used to embolize the distal branches of the inferior pancreticoduodenal arcade, preserving flow to a proximal duodenal branch arising from the parent vessel. Repeat angiogram from proximal in the pancreaticoduodenal parent branch demonstrated complete embolization of the inferior aspect of the mass. The microcatheter was then removed and selection of the celiac trunk was pursued. A 5 [place] Mickelson catheter was engaged in the celiac trunk and celiac angiogram was performed. Angiogram was significant for patent splenic and common hepatic arteries with supply arising from the gastroduodenal artery to the more superior aspect of the spherical mass. Again, no evidence of active gastrointestinal hemorrhage was observed. The gastroduodenal artery was then selected the Progreat microcatheter and fathom wire. Angiogram from the proximal duodenal artery demonstrated tumor blush arising from the superior pancreaticoduodenal branches. Therefore, coil embolization was performed of the distal gastroduodenal artery at the pancreaticoduodenal branch point with an assortment of Penumbra Ruby microcoils. Upon deployment of the final coil, a previously deployed coil became dislodged and traveled into the proximal proper hepatic artery. A micro snare was then used to attempt to capture the coil which was unsuccessful due to the snare being too small. Therefore, a Wholey wire was directed into the distal proper [place] artery. The base catheter and 5 French femoral sheath were removed. A 7 French, 55 cm tori guide sheath was then  inserted and directed into the celiac ostium. A 6 [place] Ensnare was then used to capture and removed the displaced coil. Completion celiac angiogram demonstrated adequate embolization  of the distal gastroduodenal artery without significant evidence of persistent enhancement about the superior aspect of the pancreatic head mass. Vasospasm was visualized in the right hepatic artery. The 7 French sheath was removed and exchanged for an 8 French Angio-Seal device which was deployed successfully to close the right common femoral artery. Distal pulses were unchanged. A sterile bandage was applied. The patient tolerated the procedure well was transferred back to the ICU in stable condition. IMPRESSION: 1. Hyperenhancing spherical mass about the pancreatic head with vascular supply from the superior and inferior pancreaticoduodenal arcades. No evidence of active gastrointestinal hemorrhage. 2. Given that this mass is the presumed etiology of gastrointestinal bleeding, coil embolization of the inferior and superior pancreatico duodenal arteries was performed successfully. Ruthann Cancer, MD [facility] Radiology Electronically Signed   By: Ruthann Cancer M.D.   On: 02/17/2022 09:52   IR EMBO ART  VEN HEMORR LYMPH EXTRAV  INC GUIDE ROADMAPPING  Result Date: 02/17/2022 INDICATION: 88 year old female with chronic, worsening upper gastrointestinal hemorrhage with hypervascular pancreatic head mass visualized on CT earlier today, concerning for neuroendocrine tumor. EXAM: 1. Ultrasound-guided vascular access of the right common femoral artery. 2. Catheterization and angiography of the superior mesenteric and celiac arteries 3. Sub selective catheterization angiography of the inferior pancreaticoduodenal arcade and gastroduodenal arteries 4. Coil embolization of the inferior pancreaticoduodenal arcade and gastroduodenal artery. MEDICATIONS: None. ANESTHESIA/SEDATION: Moderate  (conscious) sedation was employed during this procedure. A total of Versed 0 mg and Fentanyl 12.5 mcg was administered intravenously. Moderate Sedation Time: 196 minutes. The patient's level of consciousness and vital signs were monitored continuously by radiology nursing throughout the procedure under my direct supervision. CONTRAST:  58m OMNIPAQUE IOHEXOL 300 MG/ML SOLN, 1023mOMNIPAQUE IOHEXOL 300 MG/ML SOLN, 4053mMNIPAQUE IOHEXOL 300 MG/ML SOLN, 31m108mNIPAQUE IOHEXOL 300 MG/ML SOLN FLUOROSCOPY: Radiation Exposure Index (as provided by the fluoroscopic device): [phone] Kerma COMPLICATIONS: None immediate. PROCEDURE: Informed consent was obtained from the patient following explanation of the procedure, risks, benefits and alternatives. The patient understands, agrees and consents for the procedure. All questions were addressed. A time out was performed prior to the initiation of the procedure. Maximal barrier sterile technique utilized including caps, mask, sterile gowns, sterile gloves, large sterile drape, hand hygiene, and Betadine prep. Preprocedure ultrasound evaluation of the right common femoral artery demonstrated patency. The procedure was planned. Subdermal Local anesthesia was provided at the planned needle entry site. A small skin nick was made with an 11 scalpel. Under direct ultrasound visualization, the right common femoral artery was punctured with a 21 gauge micropuncture needle. A permanent ultrasound image was captured and stored in the record. A microwire was introduced without difficulty. The needle was exchanged for a micropuncture sheath. Limited right lower extremity angiogram demonstrated adequate puncture site for closure device use. A J wire was directed to the level of the abdominal aorta. The micropuncture sheath was exchanged for a 5 [place] cm vascular sheath. A 5 French, C2 catheter was directed to the level of the superior mesenteric artery, however was unable to engage the  ostium. Therefore, the catheter was exchanged over the wire for a Mickelson catheter which was used to select the superior mesenteric artery ostium. Superior mesenteric angiogram was performed which demonstrated patency of the superior mesenteric artery with hypervascular mass in the region of the pancreatic head which measured approximately 4.2 x 4.1 cm in frontal projection diameters. The mass vascular supply appeared to arise from a proximal branch of  the superior mesenteric artery compatible with the inferior pancreaticoduodenal arcade. There is no definite evidence of active extravasation into the gastrointestinal lumen. A Progreat Omega microcatheter and fathom 16 microwire were used to attempt to select the inferior pancreaticoduodenal arcade, however this was very challenging due to angulation and position of the base catheter in the SMA. A combination of catheters and wires were attempted which was eventually successful with a 5 [place] RIM catheter in the SMA, a 2.5 [place] cantata microcatheter, and 0.018 inch double angle glide GT wire. Angiogram of the inferior pancreaticoduodenal arcade was performed which demonstrated primary supply to the inferior aspect of the spherical pancreatic head mass. Again, no evidence of definite gastric extravasation into the duodenal lumen was observed. There was early venous outflow via peripheral portal vein branches concerning for possible arteriovenous shunting. An assortment of Penumbra Ruby microcoils were then used to embolize the distal branches of the inferior pancreticoduodenal arcade, preserving flow to a proximal duodenal branch arising from the parent vessel. Repeat angiogram from proximal in the pancreaticoduodenal parent branch demonstrated complete embolization of the inferior aspect of the mass. The microcatheter was then removed and selection of the celiac trunk was pursued. A 5 [place] Mickelson catheter was engaged in the celiac trunk and celiac angiogram  was performed. Angiogram was significant for patent splenic and common hepatic arteries with supply arising from the gastroduodenal artery to the more superior aspect of the spherical mass. Again, no evidence of active gastrointestinal hemorrhage was observed. The gastroduodenal artery was then selected the Progreat microcatheter and fathom wire. Angiogram from the proximal duodenal artery demonstrated tumor blush arising from the superior pancreaticoduodenal branches. Therefore, coil embolization was performed of the distal gastroduodenal artery at the pancreaticoduodenal branch point with an assortment of Penumbra Ruby microcoils. Upon deployment of the final coil, a previously deployed coil became dislodged and traveled into the proximal proper hepatic artery. A micro snare was then used to attempt to capture the coil which was unsuccessful due to the snare being too small. Therefore, a Wholey wire was directed into the distal proper [place] artery. The base catheter and 5 French femoral sheath were removed. A 7 French, 55 cm tori guide sheath was then inserted and directed into the celiac ostium. A 6 [place] Ensnare was then used to capture and removed the displaced coil. Completion celiac angiogram demonstrated adequate embolization of the distal gastroduodenal artery without significant evidence of persistent enhancement about the superior aspect of the pancreatic head mass. Vasospasm was visualized in the right hepatic artery. The 7 French sheath was removed and exchanged for an 8 French Angio-Seal device which was deployed successfully to close the right common femoral artery. Distal pulses were unchanged. A sterile bandage was applied. The patient tolerated the procedure well was transferred back to the ICU in stable condition. IMPRESSION: 1. Hyperenhancing spherical mass about the pancreatic head with vascular supply from the superior and inferior pancreaticoduodenal arcades. No evidence of active  gastrointestinal hemorrhage. 2. Given that this mass is the presumed etiology of gastrointestinal bleeding, coil embolization of the inferior and superior pancreatico duodenal arteries was performed successfully. Ruthann Cancer, MD [facility] Electronically Signed   By: Ruthann Cancer M.D.   On: 02/17/2022 09:52   IR Angiogram Visceral Selective  Result Date: 02/17/2022 INDICATION: 88 year old female with chronic, worsening upper gastrointestinal hemorrhage with hypervascular pancreatic head mass visualized on CT earlier today, concerning for neuroendocrine tumor. EXAM: 1. Ultrasound-guided vascular access of the right common  femoral artery. 2. Catheterization and angiography of the superior mesenteric and celiac arteries 3. Sub selective catheterization angiography of the inferior pancreaticoduodenal arcade and gastroduodenal arteries 4. Coil embolization of the inferior pancreaticoduodenal arcade and gastroduodenal artery. MEDICATIONS: None. ANESTHESIA/SEDATION: Moderate (conscious) sedation was employed during this procedure. A total of Versed 0 mg and Fentanyl 12.5 mcg was administered intravenously. Moderate Sedation Time: 196 minutes. The patient's level of consciousness and vital signs were monitored continuously by radiology nursing throughout the procedure under my direct supervision. CONTRAST:  75m OMNIPAQUE IOHEXOL 300 MG/ML SOLN, 1068mOMNIPAQUE IOHEXOL 300 MG/ML SOLN, 4030mMNIPAQUE IOHEXOL 300 MG/ML SOLN, 62m28mNIPAQUE IOHEXOL 300 MG/ML SOLN FLUOROSCOPY: Radiation Exposure Index (as provided by the fluoroscopic device): [phone] Kerma COMPLICATIONS: None immediate. PROCEDURE: Informed consent was obtained from the patient following explanation of the procedure, risks, benefits and alternatives. The patient understands, agrees and consents for the procedure. All questions were addressed. A time out was performed prior to the initiation of  the procedure. Maximal barrier sterile technique utilized including caps, mask, sterile gowns, sterile gloves, large sterile drape, hand hygiene, and Betadine prep. Preprocedure ultrasound evaluation of the right common femoral artery demonstrated patency. The procedure was planned. Subdermal Local anesthesia was provided at the planned needle entry site. A small skin nick was made with an 11 scalpel. Under direct ultrasound visualization, the right common femoral artery was punctured with a 21 gauge micropuncture needle. A permanent ultrasound image was captured and stored in the record. A microwire was introduced without difficulty. The needle was exchanged for a micropuncture sheath. Limited right lower extremity angiogram demonstrated adequate puncture site for closure device use. A J wire was directed to the level of the abdominal aorta. The micropuncture sheath was exchanged for a 5 [place] cm vascular sheath. A 5 French, C2 catheter was directed to the level of the superior mesenteric artery, however was unable to engage the ostium. Therefore, the catheter was exchanged over the wire for a Mickelson catheter which was used to select the superior mesenteric artery ostium. Superior mesenteric angiogram was performed which demonstrated patency of the superior mesenteric artery with hypervascular mass in the region of the pancreatic head which measured approximately 4.2 x 4.1 cm in frontal projection diameters. The mass vascular supply appeared to arise from a proximal branch of the superior mesenteric artery compatible with the inferior pancreaticoduodenal arcade. There is no definite evidence of active extravasation into the gastrointestinal lumen. A Progreat Omega microcatheter and fathom 16 microwire were used to attempt to select the inferior pancreaticoduodenal arcade, however this was very challenging due to angulation and position of the base catheter in the SMA. A combination of catheters and wires  were attempted which was eventually successful with a 5 [place] catheter in the SMA, a 2.5 [place]ren[place] microcatheter, and 0.018 inch double angle glide GT wire. Angiogram of the inferior pancreaticoduodenal arcade was performed which demonstrated primary supply to the inferior aspect of the spherical pancreatic head mass. Again, no evidence of definite gastric extravasation into the duodenal lumen was observed. There was early venous outflow via peripheral portal vein branches concerning for possible arteriovenous shunting. An assortment of Penumbra Ruby microcoils were then used to embolize the distal branches of the inferior pancreticoduodenal arcade, preserving flow to a proximal duodenal branch arising from the parent vessel. Repeat angiogram from proximal in the pancreaticoduodenal parent branch demonstrated complete embolization of the inferior aspect of the mass. The microcatheter was then removed and selection of the celiac  trunk was pursued. A 5 [place] Mickelson catheter was engaged in the celiac trunk and celiac angiogram was performed. Angiogram was significant for patent splenic and common hepatic arteries with supply arising from the gastroduodenal artery to the more superior aspect of the spherical mass. Again, no evidence of active gastrointestinal hemorrhage was observed. The gastroduodenal artery was then selected the Progreat microcatheter and fathom wire. Angiogram from the proximal duodenal artery demonstrated tumor blush arising from the superior pancreaticoduodenal branches. Therefore, coil embolization was performed of the distal gastroduodenal artery at the pancreaticoduodenal branch point with an assortment of Penumbra Ruby microcoils. Upon deployment of the final coil, a previously deployed coil became dislodged and traveled into the proximal proper hepatic artery. A micro snare was then used to attempt to capture the coil which was unsuccessful due to the snare being too small.  Therefore, a Wholey wire was directed into the distal proper [place] artery. The base catheter and 5 French femoral sheath were removed. A 7 French, 55 cm tori guide sheath was then inserted and directed into the celiac ostium. A 6 [place] Ensnare was then used to capture and removed the displaced coil. Completion celiac angiogram demonstrated adequate embolization of the distal gastroduodenal artery without significant evidence of persistent enhancement about the superior aspect of the pancreatic head mass. Vasospasm was visualized in the right hepatic artery. The 7 French sheath was removed and exchanged for an 8 French Angio-Seal device which was deployed successfully to close the right common femoral artery. Distal pulses were unchanged. A sterile bandage was applied. The patient tolerated the procedure well was transferred back to the ICU in stable condition. IMPRESSION: 1. Hyperenhancing spherical mass about the pancreatic head with vascular supply from the superior and inferior pancreaticoduodenal arcades. No evidence of active gastrointestinal hemorrhage. 2. Given that this mass is the presumed etiology of gastrointestinal bleeding, coil embolization of the inferior and superior pancreatico duodenal arteries was performed successfully. Ruthann Cancer, MD [facility] Radiology Electronically Signed   By: Ruthann Cancer M.D.   On: 02/17/2022 09:52   CT Angio Abd/Pel w/ and/or w/o  Result Date: 02/16/2022 CLINICAL DATA:  GI bleed, suspected small bowel bleeding, small bowel AVMs and ulcers * Tracking Code: BO * EXAM: CTA ABDOMEN AND PELVIS WITHOUT AND WITH CONTRAST TECHNIQUE: Multidetector CT imaging of the abdomen and pelvis was performed using the standard protocol during bolus administration of intravenous contrast. Multiplanar reconstructed images and MIPs were obtained and reviewed to evaluate the vascular anatomy. RADIATION DOSE REDUCTION: This exam was  performed according to the departmental dose-optimization program which includes automated exposure control, adjustment of the mA and/or kV according to patient size and/or use of iterative reconstruction technique. CONTRAST:  38m OMNIPAQUE IOHEXOL 350 MG/ML SOLN COMPARISON:  CT abdomen pelvis, 02/25/2019 FINDINGS: VASCULAR Normal contour and caliber of the abdominal aorta. No evidence of aneurysm, dissection or other acute aortic pathology. Standard branching pattern of the abdominal aorta, including a solitary left renal artery. Severe mixed calcific atherosclerosis. Bilateral common iliac artery origins. Review of the MIP images confirms the above findings. NON-VASCULAR Lower chest: Moderate bilateral pleural effusions. Coronary artery calcifications. Hepatobiliary: Subtly enhancing subcapsular lesion of the posterior inferior right lobe of the liver, hepatic segment VI, measuring 1.1 x 1.0 cm (series 8, image 47). This lesion demonstrates greater enhancement on portal venous phase, in contrast to pancreatic lesions. Benign subcapsular cyst of the anterior left lobe of the liver (series 8, image 14). Status post cholecystectomy. No  biliary dilatation. Pancreas: New, or at least markedly enlarged, markedly arterially hyperenhancing mass of the inferior pancreatic head and uncinate measuring 4.7 x 3.9 cm (series 6, image 59). This appears to closely involve the adjacent transverse portion of the duodenal with large collaterals about the duodenal wall (series 6, image 64). There are two similarly hyperenhancing nodule of the pancreatic tail, measuring 1.1 x 1.1 cm and 1.0 x 1.0 cm (series 6, image 51). No pancreatic ductal dilatation or surrounding inflammatory changes. Spleen: Normal in size without significant abnormality. Adrenals/Urinary Tract: Adrenal glands are unremarkable. Status post right nephrectomy and adrenalectomy. The left kidney is normal, without renal calculi, solid lesion, or hydronephrosis.  Limited visualization of the urinary bladder secondary to dense metallic streak artifact from bilateral hip arthroplasty. Stomach/Bowel: Stomach is within normal limits. Appendix is not clearly visualized. No evidence of bowel wall thickening, distention, or inflammatory changes. Sigmoid diverticulosis. Vascular/Lymphatic: No significant vascular findings are present. No enlarged abdominal or pelvic lymph nodes. Reproductive: Status post hysterectomy. Other: No abdominal wall hernia or abnormality. Small volume free fluid in the low pelvis (series 6, image 45). Musculoskeletal: Status post bilateral hip total arthroplasty. Unchanged wedge deformity of L2. IMPRESSION: 1. New, or at least markedly enlarged, arterially hyperenhancing mass of the inferior pancreatic head and uncinate measuring 4.7 x 3.9 cm. This appears to closely involve the adjacent transverse portion of the duodenum with large collaterals about the duodenal wall. 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 Given involvement of the duodenal, this is most likely source of reported GI bleeding. 2. 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 3. Subtly enhancing subcapsular lesion of the posterior inferior right lobe of the liver, hepatic segment VI, measuring 1.1 x 1.0 cm. This lesion demonstrates greater enhancement on portal venous phase, in contrast to pancreatic lesions suggesting an incidental, benign hepatic lesion rather than a metastasis. Attention on follow-up. 4. No other findings within the bowel localize GI bleeding. Sigmoid diverticulosis without evidence of intraluminal contrast in the lower GI tract. 5. Moderate bilateral pleural effusions. 6. Small volume free fluid in the low pelvis. 7. Status post right nephrectomy and adrenalectomy. 8. Coronary artery disease. Electronically Signed   By: Delanna Ahmadi M.D.   On: 02/16/2022 17:04   ECHOCARDIOGRAM COMPLETE  Result Date: 02/15/2022    ECHOCARDIOGRAM REPORT   Patient Name:   MAIZE BRITTINGHAM Date of Exam: 02/15/2022 Medical Rec #:  [id number]         Height:       66.0 in Accession #:    [id number]        Weight:       142.0 lb Date of Birth:  [birth year]         BSA:          1.729 m Patient Age:    88 years          BP:           125/52 mmHg Patient Gender: F                 HR:           52 bpm. Exam Location:  Inpatient Procedure: 2D Echo, Cardiac Doppler and Color Doppler Indications:    Elevated troponin  History:        Patient has prior history of Echocardiogram examinations, most  recent 06/27/2021. CHF; CAD.  Sonographer:    Jefferey Pica Referring Phys: [place]  1. Left ventricular ejection fraction, by estimation, is 60 to 65%. The left ventricle has normal function. The left ventricle has no regional wall motion abnormalities. There is moderate concentric left ventricular hypertrophy. Left ventricular diastolic parameters are consistent with Grade II diastolic dysfunction (pseudonormalization). There is the interventricular septum is flattened in systole and diastole, consistent with right ventricular pressure and volume overload.  2. Right ventricular systolic function is normal. The right ventricular size is mildly enlarged. There is severely elevated pulmonary artery systolic pressure.  3. Left atrial size was moderately dilated.  4. Right atrial size was moderately dilated.  5. A small pericardial effusion is present. Large pleural effusion.  6. Mild mitral valve regurgitation.  7. Tricuspid valve regurgitation is moderate to severe.  8. The aortic valve is calcified. Aortic valve regurgitation is mild. Mild aortic valve stenosis.  9. The inferior vena cava is dilated in size with <50% respiratory variability, suggesting right atrial pressure of 15 mmHg. Comparison(s): No significant change from  prior study. FINDINGS  Left Ventricle: Left ventricular ejection fraction, by estimation, is 60 to 65%. The left ventricle has normal function. The left ventricle has no regional wall motion abnormalities. The left ventricular internal cavity size was normal in size. There is  moderate concentric left ventricular hypertrophy. Abnormal (paradoxical) septal motion, consistent with RV pacemaker and the interventricular septum is flattened in systole and diastole, consistent with right ventricular pressure and volume overload. Left ventricular diastolic parameters are consistent with Grade II diastolic dysfunction (pseudonormalization). Right Ventricle: The right ventricular size is mildly enlarged. Right ventricular systolic function is normal. There is severely elevated pulmonary artery systolic pressure. The tricuspid regurgitant velocity is 4.32 m/s, and with an assumed right atrial  pressure of 15 mmHg, the estimated right ventricular systolic pressure is 41.6 mmHg. Left Atrium: Left atrial size was moderately dilated. Right Atrium: Right atrial size was moderately dilated. Pericardium: A small pericardial effusion is present. Mitral Valve: Mild mitral valve regurgitation. MV peak gradient, 20.4 mmHg. The mean mitral valve gradient is 5.0 mmHg. Tricuspid Valve: Tricuspid valve regurgitation is moderate to severe. Aortic Valve: The aortic valve is calcified. Aortic valve regurgitation is mild. Aortic regurgitation PHT measures 337 msec. Mild aortic stenosis is present. Aortic valve peak gradient measures 12.5 mmHg. Pulmonic Valve: Pulmonic valve regurgitation is trivial. Aorta: The aortic root and ascending aorta are structurally normal, with no evidence of dilitation. Venous: The inferior vena cava is dilated in size with less than 50% respiratory variability, suggesting right atrial pressure of 15 mmHg. IAS/Shunts: No atrial level shunt detected by color flow Doppler. Additional Comments: A device lead is  visualized. There is a large pleural effusion.  LEFT VENTRICLE PLAX 2D LVIDd:         3.80 cm LVIDs:         2.60 cm LV PW:         1.45 cm LV IVS:        1.15 cm LVOT diam:     1.80 cm LV SV:         70 LV SV Index:   40 LVOT Area:     2.54 cm  RIGHT VENTRICLE             IVC RV Basal diam:  2.90 cm     IVC diam: 2.10 cm RV S prime:     10.80 cm/s TAPSE (  M-mode): 1.6 cm LEFT ATRIUM             Index        RIGHT ATRIUM           Index LA diam:        4.60 cm 2.66 cm/m   RA Area:     29.60 cm LA Vol (A2C):   62.1 ml 35.92 ml/m  RA Volume:   116.00 ml 67.09 ml/m LA Vol (A4C):   67.2 ml 38.87 ml/m LA Biplane Vol: 69.1 ml 39.97 ml/m  AORTIC VALVE                 PULMONIC VALVE AV Area (Vmax): 1.73 cm     PV Vmax:       1.03 m/s AV Vmax:        176.50 cm/s  PV Peak grad:  4.2 mmHg AV Peak Grad:   12.5 mmHg LVOT Vmax:      120.00 cm/s LVOT Vmean:     75.400 cm/s LVOT VTI:       0.274 m AI PHT:         337 msec  AORTA Ao Root diam: 2.90 cm Ao Asc diam:  2.90 cm MITRAL VALVE                TRICUSPID VALVE MV Area (PHT): 4.41 cm     TR Peak grad:   74.6 mmHg MV Area VTI:   1.22 cm     TR Vmax:        432.00 cm/s MV Peak grad:  20.4 mmHg MV Mean grad:  5.0 mmHg     SHUNTS MV Vmax:       2.26 m/s     Systemic VTI:  0.27 m MV Vmean:      90.6 cm/s    Systemic Diam: 1.80 cm MV Decel Time: 172 msec MV E velocity: 185.00 cm/s MV A velocity: 75.80 cm/s MV E/A ratio:  2.44 Mary Scientist, physiological signed by Phineas Inches Signature Date/Time: 02/15/2022/3:01:50 PM    Final    DG CHEST PORT 1 VIEW  Result Date: 02/14/2022 CLINICAL DATA:  Hypoxemia.  COVID positive. EXAM: PORTABLE CHEST 1 VIEW COMPARISON:  Chest two views 02/13/2022 FINDINGS: Left chest wall cardiac AICD with leads overlying the right atrium and right ventricle. Cardiac silhouette is again mildly enlarged. Mediastinal contours are within normal limits. Moderate calcification within the aortic arch. Unchanged chronic mild bilateral interstitial  thickening. Small left pleural effusion with left basilar heterogeneous airspace opacity, similar to 02/13/2022. No pneumothorax is seen. Mild multilevel degenerative disc changes of the thoracic spine. Chronic sclerotic focus within the inferior medial left humeral head appears unchanged from multiple prior radiographs including 02/24/2019, benign. IMPRESSION: 1. Small left pleural effusion with left basilar heterogeneous airspace opacity, similar to 02/13/2022. This is suspicious for atelectasis versus pneumonia. 2. Unchanged mild cardiomegaly. Electronically Signed   By: Yvonne Kendall M.D.   On: 02/14/2022 11:56   DG Chest 2 View  Result Date: 02/13/2022 CLINICAL DATA:  Post COVID low hemoglobin fatigue EXAM: CHEST - 2 VIEW COMPARISON:  03/08/2021 FINDINGS: Left-sided pacing device as before. Small bilateral pleural effusions. Stable cardiomediastinal silhouette with aortic atherosclerosis. No consolidation or pneumothorax. Chronic compression fracture of the upper lumbar spine. Stable sclerotic focus in the proximal left humerus. IMPRESSION: Small bilateral pleural effusions Electronically Signed   By: Donavan Foil M.D.   On: 02/13/2022 19:23      Today  Subjective    Alyra Patty today has no headache,no chest abdominal pain,no new weakness tingling or numbness, feels much better wants to go home today.    Objective   Blood pressure (!) 121/52, pulse 63, temperature 98 F (36.7 C), temperature source Oral, resp. rate 19, height '5\' 6"'$  (1.676 m), weight 64.4 kg, SpO2 98 %.   Intake/Output Summary (Last 24 hours) at 03/01/2022 0858 Last data filed at 02/28/2022 1800 Gross per 24 hour  Intake 358.33 ml  Output --  Net 358.33 ml    Exam  Awake Alert, No new F.N deficits,    [place].AT,PERRAL Supple Neck,   Symmetrical Chest wall movement, Good air movement bilaterally, CTAB RRR,No Gallops,   +ve B.Sounds, Abd Soft, Non tender,  No Cyanosis, Clubbing or edema    Data Review    Recent Labs  Lab 02/25/22 0406 02/26/22 0637 02/27/22 0756 02/28/22 0545 03/01/22 0738  WBC 4.7 4.1 3.6* 3.3* 3.5*  HGB 8.1* 8.0* 8.1* 7.7* 9.3*  HCT 25.3* 25.1* 25.8* 24.3* 29.2*  PLT 151 139* 138* 123* 142*  MCV 94.4 95.8 96.3 96.0 94.8  MCH 30.2 30.5 30.2 30.4 30.2  MCHC 32.0 31.9 31.4 31.7 31.8  RDW 17.7* 18.3* 18.6* 19.4* 20.5*  LYMPHSABS 0.3* 0.2* 0.3* 0.3* 0.4*  MONOABS 0.4 0.3 0.3 0.4 0.4  EOSABS 0.0 0.0 0.0 0.0 0.0  BASOSABS 0.0 0.0 0.0 0.0 0.0    Recent Labs  Lab 02/25/22 0406 02/26/22 0637 02/27/22 0756 02/28/22 0545 03/01/22 0738  NA 141 139 138 140 139  K 4.3 4.0 4.1 3.9 4.2  CL 106 105 103 102 102  CO2 '25 28 29 29 30  '$ GLUCOSE 88 91 116* 96 110*  BUN 29* '19 15 14 18  '$ CREATININE 1.23* 1.07* 1.25* 1.24* 1.29*  CALCIUM 8.3* 8.4* 8.0* 8.1* 8.3*  AST  --   --  '18 17 21  '$ ALT  --   --  '16 13 13  '$ ALKPHOS  --   --  44 45 46  BILITOT  --   --  1.6* 1.3* 0.9  ALBUMIN  --   --  2.5* 2.5* 2.5*  MG  --   --  1.9 1.7 1.7    Total Time in preparing paper work, data evaluation and todays exam - 35 minutes  Lala Lund M.D on 03/01/2022 at 8:58 AM  [facility]

## 2022-03-01 NOTE — TOC Transition Note (Addendum)
Transition of Care [facility]) - CM/SW Discharge Note   Patient Details  Name: [name] MRN: [id number] Date of Birth: Sep 22, 1933  Transition of Care [facility]) CM/SW Contact:  Verdell Carmine, RN Phone Number: 03/01/2022, 9:44 AM   Clinical Narrative:    Patient accepted for [facility], Confirmed by Rep Mr. Quentin Cornwall, DME to be sent or already sent to home. Patient needs walking oxygen saturations, prior to leaving, RN aware. Granddaughter requesting hospice consult for home in case paitent changes her mind about continuing with treatment. Discussed with Dr. Candiss Norse and consult placed. She has consulted [facility], rep Bradd Canary aware.  Cary from PT will be attempting to ambulate patient and get oxygen saturation parameters.  Final next level of care: [place] Barriers to Discharge: No Barriers Identified   Patient Goals and CMS Choice Patient states their goals for this hospitalization and ongoing recovery are:: to feel better CMS Medicare.gov Compare Post Acute Care list provided to:: Patient Represenative (must comment) Choice offered to / list presented to : Adult Children  Discharge Placement                 Home with home health      Discharge Plan and Services     Post Acute Care Choice: NA          DME Arranged: Wheelchair manual, 3-N-1 DME Agency: [employer] Date DME Agency Contacted: 02/22/22   Representative spoke with at DME Agency: [place] left messager   [place] Agency: [facility] Date [place]: 02/22/22 Time [place]: 1716 Representative spoke with at [place]: Left message with rep to call back  Social Determinants of Health (SDOH) Interventions     Readmission Risk Interventions    02/15/2021   11:38 AM 03/31/2020    1:20 PM  Readmission Risk Prevention Plan  Transportation Screening Complete Complete  PCP or Specialist Appt within 5-7 Days Complete   Home Care Screening Complete   Medication Review  (RN CM) Complete   PCP or Specialist appointment within 3-5 days of discharge  Not Complete  PCP/Specialist Appt Not Complete comments  office closed for Thanksgiving holiday  [place] or [place]  Complete  SW Recovery Care/Counseling Consult  Complete  [place]  Not Applicable

## 2022-03-01 NOTE — Progress Notes (Signed)
Patient discharged per md orders. Patient appears to be stable at the time of discharge with no indication of acute distress noted. Patient vital signs stable at the time of discharge. All patient belongings with patient at discharge as well as TOC medication from [place]. Reviewed discharged instructions, medications, follow up instructions/referrals with patient and daughter at bedside. Patient verbalizes understanding, hard copy given to patient as well. PIV x2 discontinued at discharge. Transported accompanied by daughter and volunteer services via wheelchair.

## 2022-03-01 NOTE — Progress Notes (Signed)
Palliative Medicine Progress Note   Patient Name: [name]       Date: 03/01/2022 DOB: Sep 26, 1933  Age: 88 y.o. MRN#: [id number] Attending Physician: Thurnell Lose, MD Primary Care Physician: Ria Bush, MD Admit Date: 02/13/2022  Reason for Consultation/Follow-up: {Reason for Consult:23484}  HPI/Patient Profile: 88 y.o. female  admitted on 02/13/2022 with PMH significant for HTN, HLD,  CAD, cardiomyopathy s/p ICD, COPD, pulm hypertension, A-fib on Eliquis, aortic stenosis, GI bleed with AVMs, CKD, hypothyroidism, OA, RCC s/p right nephrectomy, celiac and SMA stenosis.   Patient was diagnosed with COVID 19 a week prior and had constant fatigue.  3 days prior she started having melena.  Blood work done by PCP showed low hemoglobin and low iron level and hence sent to ED on 10/11.GI was consulted. Received blood transfusion.     10/12, while getting blood transfusion, patient started having rigors, hypoxia raising concern of transfusion reaction.  Transfusion was stopped, she was given IV Solu-Medrol.  She also developed hypotension and fever, antibiotics was started.  She complained of chest pain, and had troponin elevated.   10/13, underwent EGD that showed 2 nonbleeding AVMs s/p cautery and enteroscopy   10/14, hemoglobin continued to drop to 7.1 requiring blood transfusion transferred to ICU in light of transfusion reaction 2 days ago.   10/14, CT angio abdomen pelvis showed a new 4.7 x 3.9 cm hypervascular tumor in the pancreatic head/uncinate process, most consistent with neuroendocrine tumor.  It is adjacent to duodenum and was likely to remain to be the source of GI bleeding.  Likely metastases were found in the pancreatic tail as well as liver.   10/15, s/p angiogram  and GDA/pancreaticoduodenal artery embolization by IR   10/17, after clinical stabilization, patient was transferred out to [facility] service.   10/20 right ultrasound-guided thoracentesis for 850 mL  Subjective: Chart reviewed.   I went to see patient at bedside. She is sitting up in the recliner.   Discussed plan for continued work-up for . Outpatient EUS is scheduled for Monday 10/30.   Objective:  Physical Exam          Vital Signs: BP (!) 119/46 (BP Location: Left Arm)   Pulse 70   Temp 98.4 F (36.9 C) (Oral)   Resp 17   Ht '5\' 6"'$  (1.676 m)  Wt 64.4 kg   SpO2 92%   BMI 22.92 kg/m  SpO2: SpO2: 92 % O2 Device: O2 Device: Nasal Cannula O2 Flow Rate: O2 Flow Rate (L/min): 2 L/min     LBM: Last BM Date : 03/01/22     Palliative Assessment/Data: ***     Palliative Medicine Assessment & Plan   Assessment: Principal Problem:   Acute upper gastrointestinal bleeding Active Problems:   Mixed hyperlipidemia   Permanent atrial fibrillation   COPD (chronic obstructive pulmonary disease) (HCC)   Chronic kidney disease, stage 3b (HCC)   ICD (implantable cardioverter-defibrillator) in place   Hypertension   Chronic diastolic CHF (congestive heart failure) (HCC)   Anticoagulated   Hypokalemia   Acute blood loss anemia   Symptomatic anemia   Melena   Hypothyroidism   COVID-19 virus infection   GI bleed   Pancreatic mass    Recommendations/Plan: DNR/DNI as previously documented  Goals of Care and Additional Recommendations: Limitations on Scope of Treatment: {Recommended Scope and Preferences:21019}  Code Status:   Prognosis:  {Palliative Care Prognosis:23504}  Discharge Planning: {Palliative dispostion:23505}  Care plan was discussed with ***  Thank you for allowing the Palliative Medicine Team to assist in the care of this patient.   ***   Lavena Bullion, NP   Please contact Palliative Medicine Team phone at [phone] for questions and  concerns.  For individual providers, please see AMION.

## 2022-03-01 NOTE — Progress Notes (Signed)
SATURATION QUALIFICATIONS: (This note is used to comply with regulatory documentation for home oxygen)  Patient Saturations on Room Air at Rest = 92%  Patient Saturations on Room Air while Ambulating = 86%  Patient Saturations on 2 Liters of oxygen while Ambulating = 94%  Please briefly explain why patient needs home oxygen:Unable to maintain adequate level oxygenation with activity without supplemental [place] Office [phone]

## 2022-03-01 NOTE — Progress Notes (Signed)
Physical Therapy Treatment Patient Details Name: [name] MRN: [id number] DOB: 03-22-1934 Today's Date: 03/01/2022   History of Present Illness Pt is 88 yo female [facility] with low Hgb and iron with heme + stools, covid +. Workup for UGIB. Received unit of blood 10/12 and developed possible transfusion reaction, including hypoxia, rigors. NSTEMI from demand ischemia. 10/14 likely neuroendocrine tumor and likely metastases pancreas/liver; 10/15 angiogram and artery embolization; 10/19 L thoracentesis 900 mL; 10/20 plan for R thoracentesis PMHx:  afib, dCHF, severe tricuspid regurgitation, NICM s/p ICD, COPD, PAD, CKD3, RCC s/p nephrectomy, ischemic colitis, bilat THA.    PT Comments    Pt making steady progress with mobility and eager to go home today. Pt amb in room, used the toilet and washed up at the side of bed during session. Rest breaks in between activities. Continue to recommend HHPT at dc.    Recommendations for follow up therapy are one component of a multi-disciplinary discharge planning process, led by the attending physician.  Recommendations may be updated based on patient status, additional functional criteria and insurance authorization.  Follow Up Recommendations  Home health PT     Assistance Recommended at Discharge Frequent or constant Supervision/Assistance  Patient can return home with the following A little help with walking and/or transfers;A little help with bathing/dressing/bathroom;Help with stairs or ramp for entrance;Assistance with cooking/housework;Assist for transportation   Equipment Recommendations  BSC/3in1;Wheelchair (measurements PT)    Recommendations for Other Services       Precautions / Restrictions Precautions Precautions: Fall Precaution Comments: monitor O2 Restrictions Weight Bearing Restrictions: No     Mobility  Bed Mobility               General bed mobility comments: Pt sitting EOB     Transfers Overall transfer level: Needs assistance Equipment used: None, Rolling walker (2 wheels) Transfers: Sit to/from Stand, Bed to chair/wheelchair/BSC Sit to Stand: Supervision   Step pivot transfers: Min guard       General transfer comment: Assist for safety and lines    Ambulation/Gait Ambulation/Gait assistance: Supervision Gait Distance (Feet): 45 Feet Assistive device: Rolling walker (2 wheels) Gait Pattern/deviations: Step-through pattern, Decreased step length - right, Decreased step length - left, Trunk flexed Gait velocity: decr Gait velocity interpretation: <1.31 ft/sec, indicative of household ambulator   General Gait Details: supervision for safety and Heritage manager    Modified Rankin (Stroke Patients Only)       Balance Overall balance assessment: Needs assistance Sitting-balance support: No upper extremity supported, Feet supported Sitting balance-Leahy Scale: Good     Standing balance support: No upper extremity supported, Single extremity supported, During functional activity Standing balance-Leahy Scale: Fair                              Cognition Arousal/Alertness: Awake/alert Behavior During Therapy: WFL for tasks assessed/performed Overall Cognitive Status: Within Functional Limits for tasks assessed                                          Exercises      General Comments General comments (skin integrity, edema, etc.): See separate O2 note. SpO2 86% on RA with amb, 94% on 2L with amb  Pertinent Vitals/Pain Pain Assessment Pain Assessment: No/denies pain    Home Living                          Prior Function            PT Goals (current goals can now be found in the care plan section) Progress towards PT goals: Progressing toward goals    Frequency    Min 3X/week      PT Plan Current plan remains appropriate    Co-evaluation               AM-PAC PT "6 Clicks" Mobility   Outcome Measure  Help needed turning from your back to your side while in a flat bed without using bedrails?: None Help needed moving from lying on your back to sitting on the side of a flat bed without using bedrails?: None Help needed moving to and from a bed to a chair (including a wheelchair)?: A Little Help needed standing up from a chair using your arms (e.g., wheelchair or bedside chair)?: A Little Help needed to walk in hospital room?: A Little Help needed climbing 3-5 steps with a railing? : A Little 6 Click Score: 20    End of Session Equipment Utilized During Treatment: Oxygen Activity Tolerance: Patient tolerated treatment well Patient left: in bed;with call bell/phone within reach;with family/visitor present (sitting EOB) Nurse Communication: Mobility status PT Visit Diagnosis: Other abnormalities of gait and mobility (R26.89)     Time: 0321-2248 PT Time Calculation (min) (ACUTE ONLY): 32 min  Charges:  $Gait Training: 23-37 mins                     [place] [facility] [place] 03/01/2022, 11:22 AM

## 2022-03-01 NOTE — Progress Notes (Signed)
Notified Lab in reference to pt daily am labs not being drawn, per Lab they will be up to obtain labs shortly

## 2022-03-02 DIAGNOSIS — I350 Nonrheumatic aortic (valve) stenosis: Secondary | ICD-10-CM | POA: Diagnosis not present

## 2022-03-02 DIAGNOSIS — E039 Hypothyroidism, unspecified: Secondary | ICD-10-CM | POA: Diagnosis not present

## 2022-03-02 DIAGNOSIS — K922 Gastrointestinal hemorrhage, unspecified: Secondary | ICD-10-CM | POA: Diagnosis not present

## 2022-03-02 DIAGNOSIS — I5032 Chronic diastolic (congestive) heart failure: Secondary | ICD-10-CM | POA: Diagnosis not present

## 2022-03-02 DIAGNOSIS — D631 Anemia in chronic kidney disease: Secondary | ICD-10-CM | POA: Diagnosis not present

## 2022-03-02 DIAGNOSIS — M1990 Unspecified osteoarthritis, unspecified site: Secondary | ICD-10-CM | POA: Diagnosis not present

## 2022-03-02 DIAGNOSIS — D62 Acute posthemorrhagic anemia: Secondary | ICD-10-CM | POA: Diagnosis not present

## 2022-03-02 DIAGNOSIS — I428 Other cardiomyopathies: Secondary | ICD-10-CM | POA: Diagnosis not present

## 2022-03-02 DIAGNOSIS — Z8616 Personal history of COVID-19: Secondary | ICD-10-CM | POA: Diagnosis not present

## 2022-03-02 DIAGNOSIS — Z85828 Personal history of other malignant neoplasm of skin: Secondary | ICD-10-CM | POA: Diagnosis not present

## 2022-03-02 DIAGNOSIS — Z85528 Personal history of other malignant neoplasm of kidney: Secondary | ICD-10-CM | POA: Diagnosis not present

## 2022-03-02 DIAGNOSIS — I2510 Atherosclerotic heart disease of native coronary artery without angina pectoris: Secondary | ICD-10-CM | POA: Diagnosis not present

## 2022-03-02 DIAGNOSIS — Z905 Acquired absence of kidney: Secondary | ICD-10-CM | POA: Diagnosis not present

## 2022-03-02 DIAGNOSIS — F4322 Adjustment disorder with anxiety: Secondary | ICD-10-CM | POA: Diagnosis not present

## 2022-03-02 DIAGNOSIS — I272 Pulmonary hypertension, unspecified: Secondary | ICD-10-CM | POA: Diagnosis not present

## 2022-03-02 DIAGNOSIS — E782 Mixed hyperlipidemia: Secondary | ICD-10-CM | POA: Diagnosis not present

## 2022-03-02 DIAGNOSIS — I4821 Permanent atrial fibrillation: Secondary | ICD-10-CM | POA: Diagnosis not present

## 2022-03-02 DIAGNOSIS — D490 Neoplasm of unspecified behavior of digestive system: Secondary | ICD-10-CM | POA: Diagnosis not present

## 2022-03-02 DIAGNOSIS — N179 Acute kidney failure, unspecified: Secondary | ICD-10-CM | POA: Diagnosis not present

## 2022-03-02 DIAGNOSIS — J4489 Other specified chronic obstructive pulmonary disease: Secondary | ICD-10-CM | POA: Diagnosis not present

## 2022-03-02 DIAGNOSIS — L03116 Cellulitis of left lower limb: Secondary | ICD-10-CM | POA: Diagnosis not present

## 2022-03-02 DIAGNOSIS — N1832 Chronic kidney disease, stage 3b: Secondary | ICD-10-CM | POA: Diagnosis not present

## 2022-03-02 DIAGNOSIS — I130 Hypertensive heart and chronic kidney disease with heart failure and stage 1 through stage 4 chronic kidney disease, or unspecified chronic kidney disease: Secondary | ICD-10-CM | POA: Diagnosis not present

## 2022-03-02 DIAGNOSIS — E876 Hypokalemia: Secondary | ICD-10-CM | POA: Diagnosis not present

## 2022-03-04 ENCOUNTER — Telehealth: Payer: Self-pay | Admitting: Family Medicine

## 2022-03-04 ENCOUNTER — Ambulatory Visit (HOSPITAL_BASED_OUTPATIENT_CLINIC_OR_DEPARTMENT_OTHER): Payer: PPO | Admitting: Certified Registered"

## 2022-03-04 ENCOUNTER — Other Ambulatory Visit: Payer: Self-pay

## 2022-03-04 ENCOUNTER — Ambulatory Visit (HOSPITAL_COMMUNITY): Payer: PPO | Admitting: Certified Registered"

## 2022-03-04 ENCOUNTER — Ambulatory Visit (HOSPITAL_COMMUNITY)
Admission: RE | Admit: 2022-03-04 | Discharge: 2022-03-04 | Disposition: A | Discharge status: Home / Self Care | Payer: PPO | Attending: Gastroenterology | Admitting: Gastroenterology

## 2022-03-04 ENCOUNTER — Encounter (HOSPITAL_COMMUNITY): Payer: Self-pay | Admitting: Gastroenterology

## 2022-03-04 ENCOUNTER — Encounter (HOSPITAL_COMMUNITY)
Admission: RE | Disposition: A | Discharge status: Home / Self Care | Payer: Self-pay | Source: Home / Self Care | Attending: Gastroenterology

## 2022-03-04 DIAGNOSIS — K869 Disease of pancreas, unspecified: Secondary | ICD-10-CM | POA: Diagnosis present

## 2022-03-04 DIAGNOSIS — R933 Abnormal findings on diagnostic imaging of other parts of digestive tract: Secondary | ICD-10-CM | POA: Insufficient documentation

## 2022-03-04 DIAGNOSIS — K449 Diaphragmatic hernia without obstruction or gangrene: Secondary | ICD-10-CM | POA: Diagnosis not present

## 2022-03-04 DIAGNOSIS — D62 Acute posthemorrhagic anemia: Secondary | ICD-10-CM | POA: Insufficient documentation

## 2022-03-04 DIAGNOSIS — Z905 Acquired absence of kidney: Secondary | ICD-10-CM | POA: Diagnosis not present

## 2022-03-04 DIAGNOSIS — K269 Duodenal ulcer, unspecified as acute or chronic, without hemorrhage or perforation: Secondary | ICD-10-CM

## 2022-03-04 DIAGNOSIS — Z7901 Long term (current) use of anticoagulants: Secondary | ICD-10-CM | POA: Diagnosis not present

## 2022-03-04 DIAGNOSIS — E039 Hypothyroidism, unspecified: Secondary | ICD-10-CM | POA: Diagnosis not present

## 2022-03-04 DIAGNOSIS — D378 Neoplasm of uncertain behavior of other specified digestive organs: Secondary | ICD-10-CM | POA: Insufficient documentation

## 2022-03-04 DIAGNOSIS — I2510 Atherosclerotic heart disease of native coronary artery without angina pectoris: Secondary | ICD-10-CM | POA: Diagnosis not present

## 2022-03-04 DIAGNOSIS — Z9981 Dependence on supplemental oxygen: Secondary | ICD-10-CM | POA: Diagnosis not present

## 2022-03-04 DIAGNOSIS — I739 Peripheral vascular disease, unspecified: Secondary | ICD-10-CM | POA: Diagnosis not present

## 2022-03-04 DIAGNOSIS — I899 Noninfective disorder of lymphatic vessels and lymph nodes, unspecified: Secondary | ICD-10-CM | POA: Diagnosis not present

## 2022-03-04 DIAGNOSIS — I10 Essential (primary) hypertension: Secondary | ICD-10-CM | POA: Insufficient documentation

## 2022-03-04 DIAGNOSIS — K219 Gastro-esophageal reflux disease without esophagitis: Secondary | ICD-10-CM | POA: Insufficient documentation

## 2022-03-04 DIAGNOSIS — Z8616 Personal history of COVID-19: Secondary | ICD-10-CM | POA: Diagnosis not present

## 2022-03-04 DIAGNOSIS — Z87891 Personal history of nicotine dependence: Secondary | ICD-10-CM | POA: Diagnosis not present

## 2022-03-04 DIAGNOSIS — D3A8 Other benign neuroendocrine tumors: Secondary | ICD-10-CM | POA: Diagnosis not present

## 2022-03-04 DIAGNOSIS — K3189 Other diseases of stomach and duodenum: Secondary | ICD-10-CM

## 2022-03-04 DIAGNOSIS — F419 Anxiety disorder, unspecified: Secondary | ICD-10-CM

## 2022-03-04 DIAGNOSIS — K8689 Other specified diseases of pancreas: Secondary | ICD-10-CM

## 2022-03-04 DIAGNOSIS — K2289 Other specified disease of esophagus: Secondary | ICD-10-CM

## 2022-03-04 DIAGNOSIS — Z8673 Personal history of transient ischemic attack (TIA), and cerebral infarction without residual deficits: Secondary | ICD-10-CM | POA: Insufficient documentation

## 2022-03-04 DIAGNOSIS — K264 Chronic or unspecified duodenal ulcer with hemorrhage: Secondary | ICD-10-CM | POA: Insufficient documentation

## 2022-03-04 DIAGNOSIS — J449 Chronic obstructive pulmonary disease, unspecified: Secondary | ICD-10-CM | POA: Insufficient documentation

## 2022-03-04 DIAGNOSIS — I4891 Unspecified atrial fibrillation: Secondary | ICD-10-CM | POA: Insufficient documentation

## 2022-03-04 DIAGNOSIS — K31819 Angiodysplasia of stomach and duodenum without bleeding: Secondary | ICD-10-CM | POA: Diagnosis not present

## 2022-03-04 HISTORY — PX: EUS: SHX5427

## 2022-03-04 HISTORY — PX: ESOPHAGOGASTRODUODENOSCOPY: SHX5428

## 2022-03-04 HISTORY — PX: FINE NEEDLE ASPIRATION: SHX5430

## 2022-03-04 HISTORY — PX: BIOPSY: SHX5522

## 2022-03-04 SURGERY — UPPER ENDOSCOPIC ULTRASOUND (EUS) RADIAL
Anesthesia: Monitor Anesthesia Care

## 2022-03-04 MED ORDER — SODIUM CHLORIDE 0.9 % IV SOLN: INTRAVENOUS | Status: DC

## 2022-03-04 MED ORDER — LACTATED RINGERS IV SOLN: INTRAVENOUS | Status: DC

## 2022-03-04 MED ORDER — PHENYLEPHRINE 80 MCG/ML (10ML) SYRINGE FOR IV PUSH (FOR BLOOD PRESSURE SUPPORT)
PREFILLED_SYRINGE | INTRAVENOUS | Status: DC | PRN
  Administered 2022-03-04: 120 ug via INTRAVENOUS

## 2022-03-04 MED ORDER — LACTATED RINGERS IV SOLN: INTRAVENOUS | Status: DC | PRN

## 2022-03-04 MED ORDER — PHENYLEPHRINE HCL-NACL 20-0.9 MG/250ML-% IV SOLN
INTRAVENOUS | Status: DC | PRN
  Administered 2022-03-04: 50 ug/min via INTRAVENOUS

## 2022-03-04 MED ORDER — CIPROFLOXACIN IN D5W 400 MG/200ML IV SOLN
INTRAVENOUS | Status: AC
  Filled 2022-03-04: qty 200

## 2022-03-04 MED ORDER — LIDOCAINE 2% (20 MG/ML) 5 ML SYRINGE
INTRAMUSCULAR | Status: DC | PRN
  Administered 2022-03-04: 60 mg via INTRAVENOUS
  Administered 2022-03-04: 40 mg via INTRAVENOUS

## 2022-03-04 MED ORDER — APIXABAN 2.5 MG PO TABS: 2.5000 mg | ORAL_TABLET | Freq: Two times a day (BID) | ORAL | 0 refills | Status: DC

## 2022-03-04 MED ORDER — PROPOFOL 10 MG/ML IV BOLUS
INTRAVENOUS | Status: DC | PRN
  Administered 2022-03-04: 15 mg via INTRAVENOUS
  Administered 2022-03-04: 10 mg via INTRAVENOUS

## 2022-03-04 MED ORDER — PROPOFOL 500 MG/50ML IV EMUL
INTRAVENOUS | Status: DC | PRN
  Administered 2022-03-04: 100 ug/kg/min via INTRAVENOUS

## 2022-03-04 MED ORDER — PROPOFOL 10 MG/ML IV BOLUS
INTRAVENOUS | Status: AC
  Filled 2022-03-04: qty 20

## 2022-03-04 NOTE — Telephone Encounter (Signed)
Ok to do this. I spoke with [name].

## 2022-03-04 NOTE — Telephone Encounter (Signed)
Patient daughter [name] called in and was wanting to know if they can get an order for blood work to be drawn by the [facility] nurse right away. She had a transfusion on Thursday and needs her hemoglobin checked. She can be reached at [phone]. Please advise. Thank you!

## 2022-03-04 NOTE — Anesthesia Preprocedure Evaluation (Addendum)
Anesthesia Evaluation  Patient identified by MRN, date of birth, ID band Patient awake    Reviewed: Allergy & Precautions, NPO status , Patient's Chart, lab work & pertinent test results  History of Anesthesia Complications (+) PONV and history of anesthetic complications  Airway Mallampati: II  TM Distance: >3 FB Neck ROM: Full    Dental  (+) Partial Upper, Dental Advisory Given   Pulmonary COPD,  COPD inhaler and oxygen dependent, former smoker,  L pleural effusion  Distant breath sounds  breath sounds clear to auscultation       Cardiovascular hypertension, + CAD (non-obstructive) and + Peripheral Vascular Disease  + pacemaker + Cardiac Defibrillator (device has never shocked pt)  Rhythm:Regular Rate:Normal  02/15/2022 ECHO: EF 60-65%, normal LVF, mod concentric LVH, Grade 2 DD, normal RVF, mild MR, mod-severe TR, mild AS with peak grad 12 mmHg, mild AI, small pericardial effusion   Neuro/Psych  Headaches, Anxiety TIA   GI/Hepatic GERD  Medicated,Hypervascular tumor in the pancreatic head/uncinate process, most consistent with neuroendocrine tumor, mets to liver   Endo/Other  Hypothyroidism   Renal/GU Renal InsufficiencyRenal diseaseS/p nephrectomy     Musculoskeletal  (+) Arthritis ,   Abdominal   Peds  Hematology  (+) Blood dyscrasia (Hb 9.3), anemia , Eliquis: off since was in hospital   Anesthesia Other Findings   Reproductive/Obstetrics                          Anesthesia Physical Anesthesia Plan  ASA: 4  Anesthesia Plan: MAC   Post-op Pain Management: Minimal or no pain anticipated   Induction:   PONV Risk Score and Plan: 3 and Ondansetron, Treatment may vary due to age or medical condition, TIVA and Propofol infusion  Airway Management Planned: Natural Airway and Nasal Cannula  Additional Equipment: None  Intra-op Plan:   Post-operative Plan:   Informed Consent: I  have reviewed the patients History and Physical, chart, labs and discussed the procedure including the risks, benefits and alternatives for the proposed anesthesia with the patient or authorized representative who has indicated his/her understanding and acceptance.     Dental advisory given  Plan Discussed with: CRNA and Surgeon  Anesthesia Plan Comments:        Anesthesia Quick Evaluation

## 2022-03-04 NOTE — Transfer of Care (Signed)
Immediate Anesthesia Transfer of Care Note  Patient: [name]  Procedure(s) Performed: UPPER ENDOSCOPIC ULTRASOUND (EUS) RADIAL ESOPHAGOGASTRODUODENOSCOPY (EGD) BIOPSY FINE NEEDLE ASPIRATION (FNA) LINEAR  Patient Location: Endoscopy Unit  Anesthesia Type:MAC  Level of Consciousness: drowsy  Airway & Oxygen Therapy: Patient Spontanous Breathing and Patient connected to face mask oxygen  Post-op Assessment: Report given to RN and Post -op Vital signs reviewed and stable  Post vital signs: Reviewed and stable  Last Vitals:  Vitals Value Taken Time  BP    Temp    Pulse 106 03/04/22 1557  Resp 21 03/04/22 1557  SpO2 77 % 03/04/22 1557  Vitals shown include unvalidated device data.  Last Pain:  Vitals:   03/04/22 1241  TempSrc: Oral  PainSc: 0-No pain         Complications: No notable events documented.

## 2022-03-04 NOTE — Telephone Encounter (Signed)
Plz notify daughter ok to do. I spoke with home health nurse today and gave approval for this so I expect them to do it tomorrow.

## 2022-03-04 NOTE — Telephone Encounter (Signed)
Albina Billet from [facility] called and stated that patient can't come in for the labs and wanted to know if they could draw them instead. Call back number [phone].

## 2022-03-04 NOTE — Op Note (Signed)
[facility] Patient Name: [name] Procedure Date: 03/04/2022 MRN: [id number] Attending MD: Justice Britain , MD, [id number] Date of Birth: [birth year] CSN: [id number] Age: 88 Admit Type: Outpatient Procedure:                Upper EUS Indications:              Suspected mass in pancreas on CT scan, Acute post                            hemorrhagic anemia Providers:                Justice Britain, MD, Doristine Johns, RN, Cletis Athens, Technician Referring MD:             Ria Bush, Gerrit Heck, MD Medicines:                Monitored Anesthesia Care Complications:            No immediate complications. Estimated Blood Loss:     Estimated blood loss: none. Estimated blood loss                            was minimal. Procedure:                Pre-Anesthesia Assessment:                           - Prior to the procedure, a History and Physical                            was performed, and patient medications and                            allergies were reviewed. The patient's tolerance of                            previous anesthesia was also reviewed. The risks                            and benefits of the procedure and the sedation                            options and risks were discussed with the patient.                            All questions were answered, and informed consent                            was obtained. Prior Anticoagulants: The patient has                            taken Eliquis (apixaban), last dose was 10 days  prior to procedure. ASA Grade Assessment: III - A                            patient with severe systemic disease. After                            reviewing the risks and benefits, the patient was                            deemed in satisfactory condition to undergo the                            procedure.                           After obtaining informed  consent, the endoscope was                            passed under direct vision. Throughout the                            procedure, the patient's blood pressure, pulse, and                            oxygen saturations were monitored continuously. The                            [id number]-H190 ([id number]) Olympus endoscope was introduced                            through the mouth, and advanced to the second part                            of duodenum. The [id number]-[id number] ([id number]) Olympus                            duodenoscope was introduced through the mouth, and                            advanced to the area of papilla. The [id number]                            ([id number]) Olympus linear ultrasound scope was                            introduced through the mouth, and advanced to the                            duodenum for ultrasound examination from the                            stomach and duodenum. The upper EUS was  accomplished without difficulty. The patient                            tolerated the procedure. Scope In: Scope Out: Findings:      ENDOSCOPIC FINDING: :      No gross lesions were noted in the entire esophagus.      The Z-line was irregular and was found 38 cm from the incisors.      A 3 cm hiatal hernia was present.      Multiple dispersed small erosions with no bleeding and no stigmata of       recent bleeding were found in the gastric antrum.      No other gross lesions were noted in the entire examined stomach.       Biopsies were taken with a cold forceps for histology and Helicobacter       pylori testing.      A single erosion without bleeding was found in the second portion of the       duodenum - query if this is the site of previous APC.      No other gross lesions were noted in the duodenal bulb, in the first       portion of the duodenum and in the second portion of the duodenum.      The major papilla was normal but angled superiorly.       ENDOSONOGRAPHIC FINDING: :      A rounded mass was identified in the pancreatic head/uncinate process.       The mass was partially hypoechoic but more heterogenous. There was       significant vascularity surrounding the lesion itself. The mass measured       43 mm by 36 mm in maximal cross-sectional diameter. The outer margins       were irregular. There was sonographic evidence suggesting invasion into       the portal vein (manifested by abutment). An intact interface was seen       between the mass and the superior mesenteric artery and celiac trunk       suggesting a lack of invasion. The remainder of the pancreas was       examined. The endosonographic appearance of parenchyma and the upstream       pancreatic duct indicated a normal appearing duct and mild parenchymal       atrophy. Fine needle biopsy was performed. Color Doppler imaging was       utilized prior to needle puncture to confirm a lack of significant       vascular structures within the needle path. Six passes were made with       the 22 gauge Acquire biopsy needle using a transduodenal approach.       Visible cores of tissue were obtained. Preliminary cytologic examination       and touch preps were performed. Final cytology results are pending.      Two rounded lesions were identified in the pancreatic tail. The they       were hypoechoic. The first lesion measured measured 15 mm by 10 mm in       maximal cross-sectional diameter and the second lesion measured 12 mm x       10 mm in maximal cross?"sectional diameter. The endosonographic borders       were well-defined. There was sonographic evidence suggesting abutment of  the splenic artery. Fine needle biopsy was performed of the larger       lesion. Color Doppler imaging was utilized prior to needle puncture to       confirm a lack of significant vascular structures within the needle       path. Four passes were made with a different 35 gauge acquire biopsy        needle using a transgastric approach. Visible cores of tissue were       obtained. Preliminary cytologic examination and touch preps were       performed. Final cytology results are pending.      There was no sign of significant endosonographic abnormality in the       common bile duct and in the common hepatic duct.      Endosonographic imaging in the visualized portion of the liver showed no       mass.      No malignant-appearing lymph nodes were visualized in the celiac region       (level 20), peripancreatic region and porta hepatis region.      The celiac region was visualized. Impression:               EGD impression:                           - No gross lesions in the entire esophagus. Z-line                            irregular, 38 cm from the incisors.                           - 3 cm hiatal hernia.                           - Erosive gastropathy with no bleeding and no                            stigmata of recent bleeding. No other gross lesions                            in the entire stomach. Biopsied.                           - Duodenal erosion without bleeding -likely site of                            prior APC. No other gross lesions in the duodenal                            bulb, in the first portion of the duodenum and in                            the second portion of the duodenum.                           - Normal major papilla pointed superiorly.  EUS impression:                           - A mass was identified in the pancreatic                            head/uncinate process. Cytology results are                            pending. 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 This was                            staged T3 N0 Mx by endosonographic criteria. The                            staging applies if malignancy is confirmed. Fine                             needle biopsy performed.                           -2 lesions were identified in the pancreatic tail.                            Cytology results are pending. However, the                            endosonographic appearance is suspicious for                            potential synchronous neuroendocrine tumors. Fine                            needle biopsy performed.                           - There was no sign of significant pathology in the                            common bile duct and in the common hepatic duct.                           - No malignant-appearing lymph nodes were                            visualized in the celiac region (level 20),                            peripancreatic region and porta hepatis region. Moderate Sedation:      Not Applicable - Patient had care per Anesthesia. Recommendation:           - The patient will be observed post-procedure,  until all discharge criteria are met.                           - Discharge patient to home.                           - Patient has a contact number available for                            emergencies. The signs and symptoms of potential                            delayed complications were discussed with the                            patient. Return to normal activities tomorrow.                            Written discharge instructions were provided to the                            patient.                           - Low fat diet for 2 weeks.                           - Observe patient's clinical course.                           - Await cytology results and await path results.                           - Monitor for signs/symptoms of bleeding,                            perforation, and infection. If issues please call                            our number to get further assistance as needed.                           - May consider restart of anticoagulation in 72                             hours, from a GI perspective. It is not clear that                            I can give a complete assessment as to whether the                            patient could have risk of recurrent bleeding as  she has not been on any anticoagulation trial since                            her embolization during her recent hospitalization,                            however but it is not unreasonable to give the                            patient an opportunity versus monitoring and                            further discussion in the coming weeks if necessary.                           - Pending a diagnosis being obtained, further                            referrals will be placed. Should patient have                            nondiagnostic sampling, repeat EUS makes sense for                            further larger core samples.                           - The findings and recommendations were discussed                            with the patient.                           - The findings and recommendations were discussed                            with the patient's family. Procedure Code(s):        --- Professional ---                           [phone], Esophagogastroduodenoscopy, flexible,                            transoral; with transendoscopic ultrasound-guided                            intramural or transmural fine needle                            aspiration/biopsy(s), (includes endoscopic                            ultrasound examination limited to the esophagus,                            stomach  or duodenum, and adjacent structures) Diagnosis Code(s):        --- Professional ---                           K22.89, Other specified disease of esophagus                           K44.9, Diaphragmatic hernia without obstruction or                            gangrene                           K31.89, Other diseases of stomach and duodenum                            K26.9, Duodenal ulcer, unspecified as acute or                            chronic, without hemorrhage or perforation                           K86.89, Other specified diseases of pancreas                           I89.9, Noninfective disorder of lymphatic vessels                            and lymph nodes, unspecified                           D62, Acute posthemorrhagic anemia                           R93.3, Abnormal findings on diagnostic imaging of                            other parts of digestive tract CPT copyright 2022 [employer]. All rights reserved. The codes documented in this report are preliminary and upon coder review may  be revised to meet current compliance requirements. Justice Britain, MD 03/04/2022 4:10:20 PM Number of Addenda: 0

## 2022-03-04 NOTE — Discharge Instructions (Signed)
YOU HAD AN ENDOSCOPIC PROCEDURE TODAY: Refer to the procedure report and other information in the discharge instructions given to you for any specific questions about what was found during the examination. If this information does not answer your questions, please call [place] office at [phone] to clarify.  ° °YOU SHOULD EXPECT: Some feelings of bloating in the abdomen. Passage of more gas than usual. Walking can help get rid of the air that was put into your GI tract during the procedure and reduce the bloating.  ° °DIET: Your first meal following the procedure should be a light meal and then it is ok to progress to your normal diet. A half-sandwich or bowl of soup is an example of a good first meal. Heavy or fried foods are harder to digest and may make you feel nauseous or bloated. Drink plenty of fluids but you should avoid alcoholic beverages for 24 hours. ° °ACTIVITY: Your care partner should take you home directly after the procedure. You should plan to take it easy, moving slowly for the rest of the day. You can resume normal activity the day after the procedure however YOU SHOULD NOT DRIVE, use power tools, machinery or perform tasks that involve climbing or major physical exertion for 24 hours (because of the sedation medicines used during the test).  ° °SYMPTOMS TO REPORT IMMEDIATELY: °A gastroenterologist can be reached at any hour. Please call [phone]  for any of the following symptoms:  ° °Following upper endoscopy (EGD, EUS, ERCP, esophageal dilation) °Vomiting of blood or coffee ground material  °New, significant abdominal pain  °New, significant chest pain or pain under the shoulder blades  °Painful or persistently difficult swallowing  °New shortness of breath  °Black, tarry-looking or red, bloody stools ° °FOLLOW UP:  °If any biopsies were taken you will be contacted by phone or by letter within the next 1-3 weeks. Call [phone]  if you have not heard about the biopsies in 3 weeks.    °Please also call with any specific questions about appointments or follow up tests. ° °

## 2022-03-04 NOTE — Anesthesia Postprocedure Evaluation (Signed)
Anesthesia Post Note  Patient: [name]  Procedure(s) Performed: UPPER ENDOSCOPIC ULTRASOUND (EUS) RADIAL ESOPHAGOGASTRODUODENOSCOPY (EGD) BIOPSY FINE NEEDLE ASPIRATION (FNA) LINEAR     Patient location during evaluation: Endoscopy Anesthesia Type: MAC Level of consciousness: awake and alert, patient cooperative and oriented Pain management: pain level controlled Vital Signs Assessment: post-procedure vital signs reviewed and stable Respiratory status: nonlabored ventilation, spontaneous breathing, respiratory function stable and patient connected to nasal cannula oxygen Cardiovascular status: blood pressure returned to baseline and stable Postop Assessment: no apparent nausea or vomiting Anesthetic complications: no   No notable events documented.  Last Vitals:  Vitals:   03/04/22 1241 03/04/22 1557  BP: (!) 151/49 (!) 96/33  Pulse:  62  Resp: (!) 21   Temp: 36.5 C 36.7 C  SpO2: 100% 100%    Last Pain:  Vitals:   03/04/22 1557  TempSrc: Temporal  PainSc: 0-No pain                 Mercedies Ganesh,E. Edra Riccardi

## 2022-03-04 NOTE — Anesthesia Procedure Notes (Signed)
Procedure Name: MAC Date/Time: 03/04/2022 2:32 PM  Performed by: Eben Burow, CRNAPre-anesthesia Checklist: Patient identified, Emergency Drugs available, Suction available, Patient being monitored and Timeout performed Oxygen Delivery Method: Simple face mask Placement Confirmation: positive ETCO2

## 2022-03-04 NOTE — Interval H&P Note (Signed)
History and Physical Interval Note:  03/04/2022 1:57 PM  [name]  has presented today for surgery, with the diagnosis of panc  mass.  The various methods of treatment have been discussed with the patient and family. After consideration of risks, benefits and other options for treatment, the patient has consented to  Procedure(s): UPPER ENDOSCOPIC ULTRASOUND (EUS) RADIAL (N/A) as a surgical intervention.  The patient's history has been reviewed, patient examined, no change in status, stable for surgery.  I have reviewed the patient's chart and labs.  Questions were answered to the patient's satisfaction.    This procedure will be more complex as a result of the recently utilized coils, that will likely obscure the head of the pancreas.  Time will tell what can be visualized.   The risks of an EUS including intestinal perforation, bleeding, infection, aspiration, and medication effects were discussed as was the possibility it may not give a definitive diagnosis if a biopsy is performed.  When a biopsy of the pancreas is done as part of the EUS, there is an additional risk of pancreatitis at the rate of about 1-2%.  It was explained that procedure related pancreatitis is typically mild, although it can be severe and even life threatening, which is why we do not perform random pancreatic biopsies and only biopsy a lesion/area we feel is concerning enough to warrant the risk.    [employer]

## 2022-03-05 NOTE — Telephone Encounter (Signed)
Spoke with pt's daughter, [name] (on dpr).  Relaying Dr. Synthia Innocent message.  Verbalizes understanding.    Also, [name] states pt was dx with pancreatic CA.  Bx done waiting results.  Told by [facility] to start Eliquis in 3 days.  We scheduled hosp f/u on 11/7/223 with Dr. Darnell Level.

## 2022-03-06 ENCOUNTER — Encounter (HOSPITAL_COMMUNITY): Payer: Self-pay | Admitting: Gastroenterology

## 2022-03-06 DIAGNOSIS — D490 Neoplasm of unspecified behavior of digestive system: Secondary | ICD-10-CM | POA: Diagnosis not present

## 2022-03-06 DIAGNOSIS — Z8616 Personal history of COVID-19: Secondary | ICD-10-CM | POA: Diagnosis not present

## 2022-03-06 DIAGNOSIS — I4821 Permanent atrial fibrillation: Secondary | ICD-10-CM | POA: Diagnosis not present

## 2022-03-06 DIAGNOSIS — E876 Hypokalemia: Secondary | ICD-10-CM | POA: Diagnosis not present

## 2022-03-06 DIAGNOSIS — Z7982 Long term (current) use of aspirin: Secondary | ICD-10-CM

## 2022-03-06 DIAGNOSIS — Z85828 Personal history of other malignant neoplasm of skin: Secondary | ICD-10-CM | POA: Diagnosis not present

## 2022-03-06 DIAGNOSIS — I272 Pulmonary hypertension, unspecified: Secondary | ICD-10-CM | POA: Diagnosis not present

## 2022-03-06 DIAGNOSIS — N179 Acute kidney failure, unspecified: Secondary | ICD-10-CM | POA: Diagnosis not present

## 2022-03-06 DIAGNOSIS — D631 Anemia in chronic kidney disease: Secondary | ICD-10-CM | POA: Diagnosis not present

## 2022-03-06 DIAGNOSIS — E782 Mixed hyperlipidemia: Secondary | ICD-10-CM | POA: Diagnosis not present

## 2022-03-06 DIAGNOSIS — Z85528 Personal history of other malignant neoplasm of kidney: Secondary | ICD-10-CM | POA: Diagnosis not present

## 2022-03-06 DIAGNOSIS — L03116 Cellulitis of left lower limb: Secondary | ICD-10-CM | POA: Diagnosis not present

## 2022-03-06 DIAGNOSIS — K922 Gastrointestinal hemorrhage, unspecified: Secondary | ICD-10-CM | POA: Diagnosis not present

## 2022-03-06 DIAGNOSIS — I2510 Atherosclerotic heart disease of native coronary artery without angina pectoris: Secondary | ICD-10-CM | POA: Diagnosis not present

## 2022-03-06 DIAGNOSIS — F4322 Adjustment disorder with anxiety: Secondary | ICD-10-CM | POA: Diagnosis not present

## 2022-03-06 DIAGNOSIS — Z556 Problems related to health literacy: Secondary | ICD-10-CM

## 2022-03-06 DIAGNOSIS — Z87891 Personal history of nicotine dependence: Secondary | ICD-10-CM

## 2022-03-06 DIAGNOSIS — Z905 Acquired absence of kidney: Secondary | ICD-10-CM | POA: Diagnosis not present

## 2022-03-06 DIAGNOSIS — Z9181 History of falling: Secondary | ICD-10-CM

## 2022-03-06 DIAGNOSIS — E039 Hypothyroidism, unspecified: Secondary | ICD-10-CM | POA: Diagnosis not present

## 2022-03-06 DIAGNOSIS — Z95810 Presence of automatic (implantable) cardiac defibrillator: Secondary | ICD-10-CM

## 2022-03-06 DIAGNOSIS — I350 Nonrheumatic aortic (valve) stenosis: Secondary | ICD-10-CM | POA: Diagnosis not present

## 2022-03-06 DIAGNOSIS — Z87440 Personal history of urinary (tract) infections: Secondary | ICD-10-CM

## 2022-03-06 DIAGNOSIS — I428 Other cardiomyopathies: Secondary | ICD-10-CM | POA: Diagnosis not present

## 2022-03-06 DIAGNOSIS — D62 Acute posthemorrhagic anemia: Secondary | ICD-10-CM | POA: Diagnosis not present

## 2022-03-06 DIAGNOSIS — I5032 Chronic diastolic (congestive) heart failure: Secondary | ICD-10-CM | POA: Diagnosis not present

## 2022-03-06 DIAGNOSIS — M1990 Unspecified osteoarthritis, unspecified site: Secondary | ICD-10-CM | POA: Diagnosis not present

## 2022-03-06 DIAGNOSIS — Z96643 Presence of artificial hip joint, bilateral: Secondary | ICD-10-CM

## 2022-03-06 DIAGNOSIS — Z7901 Long term (current) use of anticoagulants: Secondary | ICD-10-CM

## 2022-03-06 DIAGNOSIS — J4489 Other specified chronic obstructive pulmonary disease: Secondary | ICD-10-CM | POA: Diagnosis not present

## 2022-03-06 DIAGNOSIS — I130 Hypertensive heart and chronic kidney disease with heart failure and stage 1 through stage 4 chronic kidney disease, or unspecified chronic kidney disease: Secondary | ICD-10-CM | POA: Diagnosis not present

## 2022-03-06 DIAGNOSIS — Z9049 Acquired absence of other specified parts of digestive tract: Secondary | ICD-10-CM

## 2022-03-06 DIAGNOSIS — N1832 Chronic kidney disease, stage 3b: Secondary | ICD-10-CM | POA: Diagnosis not present

## 2022-03-06 LAB — CYTOLOGY - NON PAP

## 2022-03-06 LAB — SURGICAL PATHOLOGY

## 2022-03-07 ENCOUNTER — Encounter: Payer: Self-pay | Admitting: Gastroenterology

## 2022-03-08 ENCOUNTER — Other Ambulatory Visit: Payer: Self-pay

## 2022-03-08 ENCOUNTER — Other Ambulatory Visit (HOSPITAL_COMMUNITY): Payer: Self-pay | Admitting: *Deleted

## 2022-03-08 ENCOUNTER — Telehealth: Payer: Self-pay | Admitting: Nurse Practitioner

## 2022-03-08 DIAGNOSIS — K8689 Other specified diseases of pancreas: Secondary | ICD-10-CM

## 2022-03-08 DIAGNOSIS — D3A8 Other benign neuroendocrine tumors: Secondary | ICD-10-CM

## 2022-03-08 NOTE — Telephone Encounter (Signed)
Scheduled appointment per 11/02 referral . Patient is aware of appointment date and time. Patient is aware to arrive 15 mins prior to appointment time and to bring updated insurance cards. Patient is aware of location.

## 2022-03-08 NOTE — Telephone Encounter (Signed)
Pt left vm c/o swelling in legs and her weight is up 6lbs x 1week.  Routed to [employer]

## 2022-03-08 NOTE — Telephone Encounter (Signed)
Left vm for return call

## 2022-03-11 ENCOUNTER — Telehealth: Payer: Self-pay

## 2022-03-11 NOTE — Telephone Encounter (Signed)
Received call from [facility] who works with [facility] home health stating that pt wanted labs drawn with them so she didn't have to go out. Pt thought that we had called recently about labs. Let Nilda Simmer know that I didn't see any recent notes about pt needing blood work. Spoke with pt's daughter [name] and let her know that I didn't see any recent notes stating that pt needed blood work but it does look like cardiology contacted them about blood work.

## 2022-03-11 NOTE — Telephone Encounter (Signed)
Spoke with pt she is aware.  

## 2022-03-11 NOTE — Telephone Encounter (Signed)
Patty, patient's daughter is calling about blood work. Sorry, I didn't see the chromagranin A.

## 2022-03-12 ENCOUNTER — Encounter: Payer: Self-pay | Admitting: Family Medicine

## 2022-03-12 ENCOUNTER — Ambulatory Visit: Payer: PPO | Admitting: Family Medicine

## 2022-03-12 NOTE — Telephone Encounter (Signed)
The pt daughter wanted to confirm that when she stops pantoprazole it will not affect her iron.  I confirmed that the pantoprazole is for her reflux and not iron. She will have her stop the PPI and have chromogranin A in a week.  The pt has been advised of the information and verbalized understanding.

## 2022-03-12 NOTE — Progress Notes (Unsigned)
Patient daughter called 03/11/22 stating patient was too weak to come in for appt and wanted to know if she could just come in for her mother.  Per Lacie this would not be possible as the patient would need to come in as well. Daughter asked to reschedule 11/7 appt.  Per Cira Rue offered her 03/20/22 appt and her appt was then rescheduled as requested

## 2022-03-13 DIAGNOSIS — N179 Acute kidney failure, unspecified: Secondary | ICD-10-CM | POA: Diagnosis not present

## 2022-03-13 DIAGNOSIS — L03116 Cellulitis of left lower limb: Secondary | ICD-10-CM | POA: Diagnosis not present

## 2022-03-13 DIAGNOSIS — Z85828 Personal history of other malignant neoplasm of skin: Secondary | ICD-10-CM | POA: Diagnosis not present

## 2022-03-13 DIAGNOSIS — I4821 Permanent atrial fibrillation: Secondary | ICD-10-CM | POA: Diagnosis not present

## 2022-03-13 DIAGNOSIS — Z85528 Personal history of other malignant neoplasm of kidney: Secondary | ICD-10-CM | POA: Diagnosis not present

## 2022-03-13 DIAGNOSIS — I272 Pulmonary hypertension, unspecified: Secondary | ICD-10-CM | POA: Diagnosis not present

## 2022-03-13 DIAGNOSIS — I5032 Chronic diastolic (congestive) heart failure: Secondary | ICD-10-CM | POA: Diagnosis not present

## 2022-03-13 DIAGNOSIS — Z8616 Personal history of COVID-19: Secondary | ICD-10-CM | POA: Diagnosis not present

## 2022-03-13 DIAGNOSIS — E876 Hypokalemia: Secondary | ICD-10-CM | POA: Diagnosis not present

## 2022-03-13 DIAGNOSIS — N1832 Chronic kidney disease, stage 3b: Secondary | ICD-10-CM | POA: Diagnosis not present

## 2022-03-13 DIAGNOSIS — K922 Gastrointestinal hemorrhage, unspecified: Secondary | ICD-10-CM | POA: Diagnosis not present

## 2022-03-13 DIAGNOSIS — D490 Neoplasm of unspecified behavior of digestive system: Secondary | ICD-10-CM | POA: Diagnosis not present

## 2022-03-13 DIAGNOSIS — I350 Nonrheumatic aortic (valve) stenosis: Secondary | ICD-10-CM | POA: Diagnosis not present

## 2022-03-13 DIAGNOSIS — M1990 Unspecified osteoarthritis, unspecified site: Secondary | ICD-10-CM | POA: Diagnosis not present

## 2022-03-13 DIAGNOSIS — E782 Mixed hyperlipidemia: Secondary | ICD-10-CM | POA: Diagnosis not present

## 2022-03-13 DIAGNOSIS — F4322 Adjustment disorder with anxiety: Secondary | ICD-10-CM | POA: Diagnosis not present

## 2022-03-13 DIAGNOSIS — D62 Acute posthemorrhagic anemia: Secondary | ICD-10-CM | POA: Diagnosis not present

## 2022-03-13 DIAGNOSIS — J4489 Other specified chronic obstructive pulmonary disease: Secondary | ICD-10-CM | POA: Diagnosis not present

## 2022-03-13 DIAGNOSIS — D631 Anemia in chronic kidney disease: Secondary | ICD-10-CM | POA: Diagnosis not present

## 2022-03-13 DIAGNOSIS — Z905 Acquired absence of kidney: Secondary | ICD-10-CM | POA: Diagnosis not present

## 2022-03-13 DIAGNOSIS — I2510 Atherosclerotic heart disease of native coronary artery without angina pectoris: Secondary | ICD-10-CM | POA: Diagnosis not present

## 2022-03-13 DIAGNOSIS — E039 Hypothyroidism, unspecified: Secondary | ICD-10-CM | POA: Diagnosis not present

## 2022-03-13 DIAGNOSIS — I428 Other cardiomyopathies: Secondary | ICD-10-CM | POA: Diagnosis not present

## 2022-03-13 DIAGNOSIS — I130 Hypertensive heart and chronic kidney disease with heart failure and stage 1 through stage 4 chronic kidney disease, or unspecified chronic kidney disease: Secondary | ICD-10-CM | POA: Diagnosis not present

## 2022-03-13 MED ORDER — TORSEMIDE 20 MG PO TABS: 60.0000 mg | ORAL_TABLET | Freq: Every day | ORAL | 0 refills | Status: DC

## 2022-03-13 NOTE — Addendum Note (Signed)
Addended by: Harvie Junior on: 03/13/2022 12:04 PM   Modules accepted: Orders

## 2022-03-13 NOTE — Telephone Encounter (Signed)
Pt called back stating her swelling is still really bad despite the increase in torsemide. Pt asked if there was any other changes she needed to make.  Routed to [employer]

## 2022-03-14 ENCOUNTER — Telehealth: Payer: Self-pay | Admitting: Family Medicine

## 2022-03-14 ENCOUNTER — Ambulatory Visit: Payer: PPO | Admitting: Nurse Practitioner

## 2022-03-14 NOTE — Telephone Encounter (Signed)
Rtn call to [facility].  Told they received a faxed [facility] order for pt.  States they will fax info back to [place] so we can get it to the correct destination.  I offered my apologies and thanks for the mishap and letting us know.

## 2022-03-14 NOTE — Telephone Encounter (Signed)
Francene Finders from [facility] called over and stated that he received a fax this morning that he don't think should of been sent over to him. He just had a few questions. His direct line is [phone]. Thank you!

## 2022-03-19 DIAGNOSIS — J90 Pleural effusion, not elsewhere classified: Secondary | ICD-10-CM | POA: Diagnosis not present

## 2022-03-19 DIAGNOSIS — M6281 Muscle weakness (generalized): Secondary | ICD-10-CM | POA: Diagnosis not present

## 2022-03-19 DIAGNOSIS — R2689 Other abnormalities of gait and mobility: Secondary | ICD-10-CM | POA: Diagnosis not present

## 2022-03-19 DIAGNOSIS — I5033 Acute on chronic diastolic (congestive) heart failure: Secondary | ICD-10-CM | POA: Diagnosis not present

## 2022-03-19 DIAGNOSIS — J449 Chronic obstructive pulmonary disease, unspecified: Secondary | ICD-10-CM | POA: Diagnosis not present

## 2022-03-19 DIAGNOSIS — I130 Hypertensive heart and chronic kidney disease with heart failure and stage 1 through stage 4 chronic kidney disease, or unspecified chronic kidney disease: Secondary | ICD-10-CM | POA: Diagnosis not present

## 2022-03-19 DIAGNOSIS — Z4789 Encounter for other orthopedic aftercare: Secondary | ICD-10-CM | POA: Diagnosis not present

## 2022-03-20 ENCOUNTER — Ambulatory Visit (HOSPITAL_COMMUNITY): Payer: PPO

## 2022-03-20 ENCOUNTER — Other Ambulatory Visit: Payer: Self-pay

## 2022-03-20 ENCOUNTER — Inpatient Hospital Stay: Payer: PPO

## 2022-03-20 ENCOUNTER — Inpatient Hospital Stay: Payer: PPO | Attending: Nurse Practitioner | Admitting: Nurse Practitioner

## 2022-03-20 ENCOUNTER — Ambulatory Visit: Payer: PPO

## 2022-03-20 VITALS — BP 151/67 | HR 67 | Temp 97.9°F | Resp 16 | Wt 142.4 lb

## 2022-03-20 DIAGNOSIS — Z8616 Personal history of COVID-19: Secondary | ICD-10-CM | POA: Diagnosis not present

## 2022-03-20 DIAGNOSIS — D638 Anemia in other chronic diseases classified elsewhere: Secondary | ICD-10-CM | POA: Insufficient documentation

## 2022-03-20 DIAGNOSIS — R531 Weakness: Secondary | ICD-10-CM | POA: Diagnosis not present

## 2022-03-20 DIAGNOSIS — Z8719 Personal history of other diseases of the digestive system: Secondary | ICD-10-CM

## 2022-03-20 DIAGNOSIS — N189 Chronic kidney disease, unspecified: Secondary | ICD-10-CM | POA: Diagnosis not present

## 2022-03-20 DIAGNOSIS — Z7901 Long term (current) use of anticoagulants: Secondary | ICD-10-CM | POA: Insufficient documentation

## 2022-03-20 DIAGNOSIS — Z87891 Personal history of nicotine dependence: Secondary | ICD-10-CM | POA: Insufficient documentation

## 2022-03-20 DIAGNOSIS — I130 Hypertensive heart and chronic kidney disease with heart failure and stage 1 through stage 4 chronic kidney disease, or unspecified chronic kidney disease: Secondary | ICD-10-CM | POA: Diagnosis not present

## 2022-03-20 DIAGNOSIS — D500 Iron deficiency anemia secondary to blood loss (chronic): Secondary | ICD-10-CM | POA: Diagnosis not present

## 2022-03-20 DIAGNOSIS — K922 Gastrointestinal hemorrhage, unspecified: Secondary | ICD-10-CM | POA: Diagnosis not present

## 2022-03-20 DIAGNOSIS — J449 Chronic obstructive pulmonary disease, unspecified: Secondary | ICD-10-CM | POA: Insufficient documentation

## 2022-03-20 DIAGNOSIS — I6529 Occlusion and stenosis of unspecified carotid artery: Secondary | ICD-10-CM | POA: Insufficient documentation

## 2022-03-20 DIAGNOSIS — Z9981 Dependence on supplemental oxygen: Secondary | ICD-10-CM | POA: Diagnosis not present

## 2022-03-20 DIAGNOSIS — Z85528 Personal history of other malignant neoplasm of kidney: Secondary | ICD-10-CM | POA: Diagnosis not present

## 2022-03-20 DIAGNOSIS — I4891 Unspecified atrial fibrillation: Secondary | ICD-10-CM | POA: Insufficient documentation

## 2022-03-20 DIAGNOSIS — E538 Deficiency of other specified B group vitamins: Secondary | ICD-10-CM | POA: Insufficient documentation

## 2022-03-20 DIAGNOSIS — Z803 Family history of malignant neoplasm of breast: Secondary | ICD-10-CM | POA: Insufficient documentation

## 2022-03-20 DIAGNOSIS — J90 Pleural effusion, not elsewhere classified: Secondary | ICD-10-CM | POA: Insufficient documentation

## 2022-03-20 DIAGNOSIS — D3A8 Other benign neuroendocrine tumors: Secondary | ICD-10-CM

## 2022-03-20 DIAGNOSIS — Z85828 Personal history of other malignant neoplasm of skin: Secondary | ICD-10-CM | POA: Diagnosis not present

## 2022-03-20 DIAGNOSIS — Z955 Presence of coronary angioplasty implant and graft: Secondary | ICD-10-CM | POA: Insufficient documentation

## 2022-03-20 DIAGNOSIS — R5383 Other fatigue: Secondary | ICD-10-CM | POA: Insufficient documentation

## 2022-03-20 DIAGNOSIS — I509 Heart failure, unspecified: Secondary | ICD-10-CM | POA: Insufficient documentation

## 2022-03-20 LAB — CBC WITH DIFFERENTIAL (CANCER CENTER ONLY)
Abs Immature Granulocytes: 0.02 10*3/uL (ref 0.00–0.07)
Basophils Absolute: 0 10*3/uL (ref 0.0–0.1)
Basophils Relative: 1 %
Eosinophils Absolute: 0 10*3/uL (ref 0.0–0.5)
Eosinophils Relative: 0 %
HCT: 28.1 % — ABNORMAL LOW (ref 36.0–46.0)
Hemoglobin: 8.8 g/dL — ABNORMAL LOW (ref 12.0–15.0)
Immature Granulocytes: 1 %
Lymphocytes Relative: 12 %
Lymphs Abs: 0.4 10*3/uL — ABNORMAL LOW (ref 0.7–4.0)
MCH: 30.8 pg (ref 26.0–34.0)
MCHC: 31.3 g/dL (ref 30.0–36.0)
MCV: 98.3 fL (ref 80.0–100.0)
Monocytes Absolute: 0.3 10*3/uL (ref 0.1–1.0)
Monocytes Relative: 10 %
Neutro Abs: 2.5 10*3/uL (ref 1.7–7.7)
Neutrophils Relative %: 76 %
Platelet Count: 183 10*3/uL (ref 150–400)
RBC: 2.86 MIL/uL — ABNORMAL LOW (ref 3.87–5.11)
RDW: 18.4 % — ABNORMAL HIGH (ref 11.5–15.5)
WBC Count: 3.3 10*3/uL — ABNORMAL LOW (ref 4.0–10.5)
nRBC: 0 % (ref 0.0–0.2)

## 2022-03-20 LAB — CMP (CANCER CENTER ONLY)
ALT: 5 U/L (ref 0–44)
AST: 14 U/L — ABNORMAL LOW (ref 15–41)
Albumin: 4 g/dL (ref 3.5–5.0)
Alkaline Phosphatase: 61 U/L (ref 38–126)
Anion gap: 7 (ref 5–15)
BUN: 13 mg/dL (ref 8–23)
CO2: 35 mmol/L — ABNORMAL HIGH (ref 22–32)
Calcium: 9.5 mg/dL (ref 8.9–10.3)
Chloride: 99 mmol/L (ref 98–111)
Creatinine: 1.23 mg/dL — ABNORMAL HIGH (ref 0.44–1.00)
GFR, Estimated: 43 mL/min — ABNORMAL LOW (ref >60)
Glucose, Bld: 85 mg/dL (ref 70–99)
Potassium: 4 mmol/L (ref 3.5–5.1)
Sodium: 141 mmol/L (ref 135–145)
Total Bilirubin: 1.2 mg/dL (ref 0.3–1.2)
Total Protein: 7.6 g/dL (ref 6.5–8.1)

## 2022-03-20 LAB — FERRITIN: Ferritin: 738 ng/mL — ABNORMAL HIGH (ref 11–307)

## 2022-03-20 LAB — IRON AND IRON BINDING CAPACITY (CC-WL,HP ONLY)
Iron: 52 ug/dL (ref 28–170)
Saturation Ratios: 24 % (ref 10.4–31.8)
TIBC: 216 ug/dL — ABNORMAL LOW (ref 250–450)
UIBC: 164 ug/dL (ref 148–442)

## 2022-03-20 NOTE — Progress Notes (Addendum)
[place]   [phone] Fax:([phone]) [place] Note   Patient Care Team: Ria Bush, MD as PCP - General (Family Medicine) Deboraha Sprang, MD as PCP - Cardiology (Cardiology) Larey Dresser, MD as PCP - Advanced Heart Failure (Cardiology) Minna Merritts, MD as Consulting Physician (Cardiology) Deboraha Sprang, MD as Consulting Physician (Cardiology) [name], Dolores Lory, MD as Consulting Physician (Vascular Surgery) Debbora Dus, [facility] as Pharmacist (Pharmacist) Sindy Guadeloupe, MD as Consulting Physician (Hematology and Oncology) Truitt Merle, MD as Consulting Physician (Oncology) 03/20/2022  CHIEF COMPLAINTS/PURPOSE OF CONSULTATION:  Neuroendocrine tumor of the pancreas, referred by GI Dr. Rush Landmark  HISTORY OF PRESENTING ILLNESS:  [name] 88 y.o. female previously seen by my colleagues Fritz Pickerel, PA and Dr. Julien Nordmann for anemia, with past medical history including Afib on Eliquis, B12 deficiency and IDA, CKD, CHF, ICD placement, carotid stenosis, HTN, mitral valve regurg, non-ischemic cardiomyopathy, TIA, vertigo, h/o upper GI bleed secondary to AVMs, COPD, hypothyroidism, gout, osteoporosis, Squamous cell skin cancer, RCC s/p nephrectomy, is here because of newly diagnosed pancreatic mass.  She had COVID in early October, and 3 days later developed melanotic stools and low hemoglobin, sent to ED 02/12/2022 for work-up where hemoglobin was found to be 8.9, FOBT positive.  During a blood transfusion on 10/12 she developed rigors, chest pain, and hypoxia concerning for transfusion reaction.  She underwent small bowel endoscopy by Dr. Christia Reading 02/15/2022 which showed a normal esophagus, hiatal hernia, gastritis, and 2 nonbleeding angiectasia's in the duodenum treated with APC; PPI was started.  Abdominal CTA 02/16/2022 showed new or at least markedly enlarged hyperenhancing mass in the inferior pancreatic head and uncinate  measuring 4.7 x 3.9 cm closely involving the adjacent transverse duodenum with large collaterals as well as 2 similarly hyperenhancing nodules in the pancreatic tail measuring 1.1 and 1 cm.  There are also subtly enhancing subcapsular lesion in the posterior inferior right liver lobe segment VI measuring 1.1 x 1.0 cm with features suggesting an incidental benign lesion rather than metastasis.  She had moderate bilateral pleural effusions and a CT chest is scheduled for later today.  The hypervascular pancreatic mass was felt to be the source of her GI bleeding.  She underwent angiogram which showed no active bleeding and proceeded with coil embolization of the pancreaticoduodenal arcade and gastroduodenal artery by IR Dr. Serafina Royals on 02/16/2022.  While in the hospital she had worsening pleural effusion and underwent Left thoracentesis 02/21/2022 with 900 mL and right-sided Thora on 10/20 yielding 850 mL, cytology was not done on the pleural fluid.  She was seen by palliative care NP Wadie Lessen on 02/27/2022 to establish relationship and begin goals of care discussion.  She stabilized and was eager to go home, discharged 03/01/2022 plans for outpatient EUS which she did on 03/04/2022 by Dr. Rush Landmark which showed a rounded mass in the pancreatic head/uncinate process with significant vascularity measuring 43 x 36 mm with evidence suggesting invasion into the portal vein by abutment and a lack of invasion with the SMA and celiac trunk.  There are also 2 rounded lesions in the pancreatic tail measuring 15 x 10 mm and 12 x 10 mm which abutted the splenic artery. There were no abnormal appearing lymph nodes.  The endosonographic appearance suggested neuroendocrine tumor.  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  This was staged T3N0.  She was scheduled as a new patient with me on 11/8 but  was too weak to come in, she was offered a phone visit but preferred to reschedule to today.  Socially she  lives in [place]; [place] is closer but she prefers to come here. Her 70 year old great grandson who is a IT trainer lives with her.  6-12 weeks ago she was functional at home able to clean, cook, and drive locally.  She has 2 children. She denies alcohol or drug use, smoked less than 1 pack/day of cigarettes for 30 years quit many years ago.  History is significant for an aunt with breast cancer in her 23s and that aunt's daughter (patient's cousin) also had breast cancer at age 17-50.  Today she presents with her daughter [name].  She remains very weak and tired but feels a little better since hospital discharge. Requires walker and is currently on 2-3 L oxygen; she was not on any oxygen prior to hospitalization. O2 level drops to 80's without. She still has exertional dyspnea, no cough or chest pain. She does PT once a week through [facility].  Leg swelling has improved, she has questions about diuretics. She is eating and drinking fairly well. Weight is within 10 lbs in past 6 months. She has intermittent diarrhea somewhat diet related for past 5 years. Denies black or bloody stool but noticed something "pink" today. She noticed flushing several weeks ago that worsened in the hospital. Denies abdominal pain/cramps.       MEDICAL HISTORY:  Past Medical History:  Diagnosis Date   Actinic keratosis 01/17/2015   R forearm   Adjustment disorder with anxiety    Anemia    Anginal pain ([place])    Arthritis    "some in my hands" (11/11/2012)   Atrial fibrillation (HCC)    Atypical mole 03/25/2018   L forearm - severe   Automatic implantable cardioverter-defibrillator in situ    Avascular necrosis of hip ([place]) 05/03/2011   Blood in stool    Carotid artery stenosis 09/2007   a. 09/2007: 60-79% bilateral (stable); b. 10/2008: 40-59% R 60-79%    Chest pain, unspecified    Chronic airway obstruction, not elsewhere classified    Chronic bronchitis ([place])    "get it some; not q year" (11/11/2012)    Chronic diastolic CHF (congestive heart failure) ([place]) 06/04/2013   Chronic kidney disease, unspecified    Coronary artery disease    non-obstructive by 2006 cath   Displaced fracture of left femoral neck ([place]) 10/09/2018   Frequent UTI    "get them a couple times/yr" (11/11/2012)   GI bleed 03/28/2020   Hemorrhage of rectum and anus    High cholesterol    History of blood transfusion 04/2011   "after hip OR" (11/11/2012)   Hx of renal cell cancer    Hypertension 05/20/2011   Hypertr obst cardiomyop    Hypotension, unspecified    cardiac cath 2006..nonobstructive CAD 30-40s lesions.Marland KitchenETT 1/09 nondiagnostic due to poor HR response..Right Renal Cancer 2003   Long term (current) use of anticoagulants    Malignant neoplasm of kidney, except pelvis    Osteoarthritis of right hip    Other and unspecified coagulation defects    PONV (postoperative nausea and vomiting)    Presence of permanent cardiac pacemaker    RECTAL BLEEDING 10/13/2009   Qualifier: Diagnosis of  By: Chester Holstein NP, Paula     Renal cancer [facility]) 06/2001   Right   Secondary cardiomyopathy, unspecified    Sinus bradycardia    Squamous cell carcinoma of skin 01/17/2015  R lat wrist   Squamous cell carcinoma of skin 01/29/2018   R post upper leg - superficially invasive   Squamous cell carcinoma of skin 03/25/2018   L lat foot   Squamous cell carcinoma of skin 11/09/2020   left lat foot - EDC 01/01/21, recurrent 01/31/21 - MOHs 02/22/21   Squamous cell carcinoma of skin 12/19/2021   Right Posterior Medial Thigh, EDC   Squamous cell carcinoma of skin 12/19/2021   SCC IS, L lat heel, EDC 02/04/2022   Squamous cell carcinoma of skin 01/21/2022   L forearm, EDC 02/04/2022   Squamous cell carcinoma of skin 01/21/2022   R lower leg below knee, EDC 02/04/2022   Squamous cell carcinoma of skin 01/21/2022   SCCIS, R post heel, EDC 02/04/2022   Urge incontinence     SURGICAL HISTORY: Past Surgical History:  Procedure  Laterality Date   ABDOMINAL AORTOGRAM W/LOWER EXTREMITY N/A 02/12/2021   Procedure: ABDOMINAL AORTOGRAM W/LOWER EXTREMITY;  Surgeon: Waynetta Sandy, MD;  Location: [place] [facility];  Service: Cardiovascular;  Laterality: N/A;   ABDOMINAL HYSTERECTOMY  1975   for benign causes   APPENDECTOMY     BI-VENTRICULAR PACEMAKER UPGRADE  05/04/2010   BIOPSY  02/28/2019   Procedure: BIOPSY;  Surgeon: Milus Banister, MD;  Location: [facility] ENDOSCOPY;  Service: Endoscopy;;   BIOPSY  03/04/2022   Procedure: BIOPSY;  Surgeon: Irving Copas., MD;  Location: [facility];  Service: Gastroenterology;;   CARDIAC CATHETERIZATION  2006   CARDIOVERSION N/A 02/20/2018   Procedure: CARDIOVERSION;  Surgeon: Minna Merritts, MD;  Location: [place] [facility];  Service: Cardiovascular;  Laterality: N/A;   CARDIOVERSION N/A 03/27/2018   Procedure: CARDIOVERSION;  Surgeon: Minna Merritts, MD;  Location: [facility] ORS;  Service: Cardiovascular;  Laterality: N/A;   CATARACT EXTRACTION W/ INTRAOCULAR LENS  IMPLANT, BILATERAL  01/2006-02-2006   CHOLECYSTECTOMY N/A 11/11/2012   Procedure: LAPAROSCOPIC CHOLECYSTECTOMY WITH INTRAOPERATIVE CHOLANGIOGRAM;  Surgeon: Imogene Burn. Georgette Dover, MD;  Location: [place];  Service: General;  Laterality: N/A;   COLONOSCOPY WITH PROPOFOL N/A 02/28/2019   Procedure: COLONOSCOPY WITH PROPOFOL;  Surgeon: Milus Banister, MD;  Location: [facility] ENDOSCOPY;  Service: Endoscopy;  Laterality: N/A;   ENTEROSCOPY N/A 03/30/2020   Procedure: ENTEROSCOPY;  Surgeon: Lavena Bullion, DO;  Location: [place];  Service: Gastroenterology;  Laterality: N/A;   ENTEROSCOPY N/A 02/10/2021   Procedure: ENTEROSCOPY;  Surgeon: Carol Ada, MD;  Location: [place];  Service: Endoscopy;  Laterality: N/A;   ENTEROSCOPY N/A 02/15/2022   Procedure: ENTEROSCOPY;  Surgeon: Sharyn Creamer, MD;  Location: [facility]COPY;  Service: Gastroenterology;  Laterality: N/A;   EP IMPLANTABLE DEVICE N/A 02/21/2016    Procedure: ICD Generator Changeout;  Surgeon: Deboraha Sprang, MD;  Location: [place] [facility];  Service: Cardiovascular;  Laterality: N/A;   ESOPHAGOGASTRODUODENOSCOPY N/A 03/04/2022   Procedure: ESOPHAGOGASTRODUODENOSCOPY (EGD);  Surgeon: Irving Copas., MD;  Location: [facility];  Service: Gastroenterology;  Laterality: N/A;   ESOPHAGOGASTRODUODENOSCOPY (EGD) WITH PROPOFOL N/A 02/28/2019   Procedure: ESOPHAGOGASTRODUODENOSCOPY (EGD) WITH PROPOFOL;  Surgeon: Milus Banister, MD;  Location: [facility] ENDOSCOPY;  Service: Endoscopy;  Laterality: N/A;   EUS N/A 03/04/2022   Procedure: UPPER ENDOSCOPIC ULTRASOUND (EUS) RADIAL;  Surgeon: Irving Copas., MD;  Location: [facility] ENDOSCOPY;  Service: Gastroenterology;  Laterality: N/A;   FINE NEEDLE ASPIRATION N/A 03/04/2022   Procedure: FINE NEEDLE ASPIRATION (FNA) LINEAR;  Surgeon: Irving Copas., MD;  Location: [facility] ENDOSCOPY;  Service: Gastroenterology;  Laterality: N/A;  GIVENS CAPSULE STUDY N/A 03/15/2019   Procedure: GIVENS CAPSULE STUDY;  Surgeon: Thornton Park, MD;  Location: [place];  Service: Gastroenterology;  Laterality: N/A;   HOT HEMOSTASIS N/A 03/30/2020   Procedure: HOT HEMOSTASIS (ARGON PLASMA COAGULATION/BICAP);  Surgeon: Lavena Bullion, DO;  Location: [facility] ENDOSCOPY;  Service: Gastroenterology;  Laterality: N/A;   HOT HEMOSTASIS N/A 02/10/2021   Procedure: HOT HEMOSTASIS (ARGON PLASMA COAGULATION/BICAP);  Surgeon: Carol Ada, MD;  Location: [place];  Service: Endoscopy;  Laterality: N/A;   HOT HEMOSTASIS N/A 02/15/2022   Procedure: HOT HEMOSTASIS (ARGON PLASMA COAGULATION/BICAP);  Surgeon: Sharyn Creamer, MD;  Location: [place];  Service: Gastroenterology;  Laterality: N/A;   INSERT / REPLACE / REMOVE PACEMAKER  05-01-11   02-28-05-/05-04-10-ICD-MEDTRONIC MAXIMAL DR   IR ANGIOGRAM SELECTIVE EACH ADDITIONAL VESSEL  02/18/2022   IR ANGIOGRAM SELECTIVE EACH ADDITIONAL VESSEL  02/18/2022   IR  ANGIOGRAM VISCERAL SELECTIVE  02/16/2022   IR ANGIOGRAM VISCERAL SELECTIVE  02/16/2022   IR EMBO ART  VEN HEMORR LYMPH EXTRAV  INC GUIDE ROADMAPPING  02/16/2022   IR THORACENTESIS ASP PLEURAL SPACE W/IMG GUIDE  02/21/2022   IR THORACENTESIS ASP PLEURAL SPACE W/IMG GUIDE  02/22/2022   IR US GUIDE VASC ACCESS RIGHT  02/16/2022   JOINT REPLACEMENT     LAPAROSCOPIC CHOLECYSTECTOMY  11/11/2012   LAPAROSCOPIC LYSIS OF ADHESIONS N/A 11/11/2012   Procedure: LAPAROSCOPIC LYSIS OF ADHESIONS;  Surgeon: Imogene Burn. Georgette Dover, MD;  Location: [place];  Service: General;  Laterality: N/A;   NEPHRECTOMY Right 06/2001    S/P RENAL CELL CANCER   PERIPHERAL VASCULAR INTERVENTION Bilateral 02/12/2021   Procedure: PERIPHERAL VASCULAR INTERVENTION;  Surgeon: Waynetta Sandy, MD;  Location: [place] [facility];  Service: Cardiovascular;  Laterality: Bilateral;  Iliac artery stents   PRESSURE SENSOR/CARDIOMEMS N/A 02/03/2019   Procedure: PRESSURE SENSOR/CARDIOMEMS;  Surgeon: Larey Dresser, MD;  Location: [place] [facility];  Service: Cardiovascular;  Laterality: N/A;   RIGHT HEART CATH N/A 11/09/2018   Procedure: RIGHT HEART CATH;  Surgeon: Larey Dresser, MD;  Location: [place] [facility];  Service: Cardiovascular;  Laterality: N/A;   RIGHT HEART CATH N/A 03/08/2019   Procedure: RIGHT HEART CATH;  Surgeon: Larey Dresser, MD;  Location: [place] [facility];  Service: Cardiovascular;  Laterality: N/A;   SUBMUCOSAL TATTOO INJECTION  02/28/2019   Procedure: SUBMUCOSAL TATTOO INJECTION;  Surgeon: Milus Banister, MD;  Location: [facility] ENDOSCOPY;  Service: Endoscopy;;   TOTAL HIP ARTHROPLASTY Right 05/03/2011   Procedure: TOTAL HIP ARTHROPLASTY ANTERIOR APPROACH;  Surgeon: Mcarthur Rossetti;  Location: [facility];  Service: Orthopedics;  Laterality: Right;  Removal of Cannulated Screws Right Hip, Right Direct Anterior Hip Replacement   TOTAL HIP ARTHROPLASTY Left 10/09/2018   Procedure: TOTAL HIP ARTHROPLASTY ANTERIOR  APPROACH;  Surgeon: Rod Can, MD;  Location: [place];  Service: Orthopedics;  Laterality: Left;    SOCIAL HISTORY: Social History   Socioeconomic History   Marital status: Widowed    Spouse name: Not on file   Number of children: 2   Years of education: high school   Highest education level: Not on file  Occupational History   Occupation: Retired    Fish farm manager: RETIRED  Tobacco Use   Smoking status: Former    Packs/day: 0.50    Years: 40.00    Total pack years: 20.00    Types: Cigarettes    Quit date: 05/06/2001    Years since quitting: 20.8   Smokeless tobacco: Never  Vaping Use   Vaping Use: Never used  Substance and Sexual Activity   Alcohol use: No    Alcohol/week: 0.0 standard drinks of alcohol   Drug use: No   Sexual activity: Not Currently  Other Topics Concern   Not on file  Social History Narrative   Would desire CPR   07/11/20   From: the area   Living: with great-grandson - [name] (2003)   Work: retired - clerical      Family: 2 children - [name] and [place] - 2 grand chlidren - 5 great grand children      Enjoys: spending time with friends - watch move, eat out, spend time      Exercise: walking around the house   Diet: not good, limits red meat, limits salt, low appetite      Safety   Seat belts: Yes    Guns: Yes  and secure   Safe in relationships: Yes    Social Determinants of Health   Financial Resource Strain: Low Risk  (09/24/2021)   Overall Financial Resource Strain (CARDIA)    Difficulty of Paying Living Expenses: Not hard at all  Food Insecurity: No Food Insecurity (02/14/2022)   Hunger Vital Sign    Worried About Running Out of Food in the Last Year: Never true    [place] in the Last Year: Never true  Transportation Needs: No Transportation Needs (02/14/2022)   PRAPARE - Hydrologist (Medical): No    Lack of Transportation (Non-Medical): No  Physical Activity: Inactive (09/24/2021)   Exercise Vital  Sign    Days of Exercise per Week: 0 days    Minutes of Exercise per Session: 0 min  Stress: No Stress Concern Present (09/24/2021)   [place]    Feeling of Stress : Not at all  Social Connections: Not on file  Intimate Partner Violence: Not At Risk (02/14/2022)   Humiliation, Afraid, Rape, and Kick questionnaire    Fear of Current or Ex-Partner: No    Emotionally Abused: No    Physically Abused: No    Sexually Abused: No    FAMILY HISTORY: Family History  Problem Relation Age of Onset   Heart failure Mother    Early death Father        car accident   Breast cancer Maternal Aunt 82   Breast cancer Cousin    Breast cancer Other    Colon cancer Neg Hx    Esophageal cancer Neg Hx    Liver disease Neg Hx    Pancreatic cancer Neg Hx    Rectal cancer Neg Hx     ALLERGIES:  is allergic to dilaudid [hydromorphone], fentanyl, codeine, morphine and related, hydrocodone, oxycodone, tramadol, whole blood, and sulfonamide derivatives.  MEDICATIONS:  Current Outpatient Medications  Medication Sig Dispense Refill   acetaminophen (TYLENOL) 500 MG tablet Take 500-1,000 mg by mouth every 8 (eight) hours as needed for mild pain or headache.     albuterol (VENTOLIN HFA) 108 (90 Base) MCG/ACT inhaler Inhale 2 puffs into the lungs every 6 (six) hours as needed for wheezing or shortness of breath. 8 g 2   apixaban (ELIQUIS) 2.5 MG TABS tablet Take 1 tablet (2.5 mg total) by mouth 2 (two) times daily. 60 tablet 0   fluticasone (FLONASE) 50 MCG/ACT nasal spray Place 2 sprays into both nostrils daily as needed for allergies or rhinitis. 16 g 6  ondansetron (ZOFRAN) 4 MG tablet Take 1 tablet (4 mg total) by mouth every 8 (eight) hours as needed for nausea or vomiting. 20 tablet 0   pantoprazole (PROTONIX) 40 MG tablet Take 1 tablet (40 mg total) by mouth 2 (two) times daily. 60 tablet 0   potassium chloride SA (KLOR-CON M) 20 MEQ  tablet Take 1 tablet (20 mEq total) by mouth daily. 30 tablet 0   torsemide (DEMADEX) 20 MG tablet Take 3 tablets (60 mg total) by mouth daily. 60 tablet 0   clobetasol cream (TEMOVATE) 0.05 % Apply to affected area left foot twice a day until improved. Avoid face, groin, axilla. (Patient not taking: Reported on 02/13/2022) 30 g 0   Cyanocobalamin (B-12 PO) Take 1 tablet by mouth daily.  (Patient not taking: Reported on 03/20/2022)     vitamin C (ASCORBIC ACID) 250 MG tablet Take 500 mg by mouth daily. (Patient not taking: Reported on 03/20/2022)     VITAMIN D PO Take 4,000 Units by mouth daily. (Patient not taking: Reported on 03/20/2022)     No current facility-administered medications for this visit.    REVIEW OF SYSTEMS:   Constitutional: Denies fevers, chills or abnormal night sweats (+) fatigue  Eyes: Denies blurriness of vision, double vision or watery eyes Ears, nose, mouth, throat, and face: Denies mucositis or sore throat Respiratory: Denies cough or wheezes (+) exertional dyspnea (+) oxygen requirement (+) COPD Cardiovascular: Denies palpitation, chest discomfort (+) lower extremity swelling (+) CHF (+) AFib on AC GU: (+) h/o R RCC  Gastrointestinal:  Denies nausea, heartburn, pain, or change in bowel habits (+) Recent UGIB, h/o AVMs (+) periodic diarrhea  Skin: Denies abnormal skin rashes (+) h/o basal skin cancer  Lymphatics: Denies new lymphadenopathy or easy bruising Neurological:Denies numbness, tingling or new weaknesses (+) flushing  Behavioral/Psych: Mood is stable, no new changes  All other systems were reviewed with the patient and are negative.  PHYSICAL EXAMINATION: ECOG PERFORMANCE STATUS: 2 - Symptomatic, <50% confined to bed  Vitals:   03/20/22 1106  BP: (!) 151/67  [name]: 67  Resp: 16  Temp: 97.9 F (36.6 C)  SpO2: 100%   Filed Weights   03/20/22 1106  Weight: 142 lb 6.4 oz (64.6 kg)    GENERAL: alert, no distress and comfortable, in  wheelchair SKIN: scattered ecchymoses over the bilateral forearms EYES:  sclera clear LUNGS: clear with normal breathing effort HEART: regular rate & rhythm, murmur noted, mild bilateral lower extremity edema ABDOMEN:abdomen soft, non-tender and normal bowel sounds PSYCH: alert & oriented x 3 with fluent speech NEURO: Generalized weakness  LABORATORY DATA:  I have reviewed the data as listed    Latest Ref Rng & Units 03/20/2022   12:53 PM 03/01/2022    7:38 AM 02/28/2022    5:45 AM  CBC  WBC 4.0 - 10.5 K/uL 3.3  3.5  3.3   Hemoglobin 12.0 - 15.0 g/dL 8.8  9.3  7.7   Hematocrit 36.0 - 46.0 % 28.1  29.2  24.3   Platelets 150 - 400 K/uL 183  142  123        Latest Ref Rng & Units 03/20/2022   12:53 PM 03/01/2022    7:38 AM 02/28/2022    5:45 AM  CMP  Glucose 70 - 99 mg/dL 85  110  96   BUN 8 - 23 mg/dL _0 Creatinine 0.44 - 1.00 mg/dL 1.23  1.29  1.24   Sodium  135 - 145 mmol/L 141  139  140   Potassium 3.5 - 5.1 mmol/L 4.0  4.2  3.9   Chloride 98 - 111 mmol/L 99  102  102   CO2 22 - 32 mmol/L 35  30  29   Calcium 8.9 - 10.3 mg/dL 9.5  8.3  8.1   Total Protein 6.5 - 8.1 g/dL 7.6  5.1  4.8   Total Bilirubin 0.3 - 1.2 mg/dL 1.2  0.9  1.3   Alkaline Phos 38 - 126 U/L 61  46  45   AST 15 - 41 U/L _0 ALT 0 - 44 U/L _1 RADIOGRAPHIC STUDIES: I have personally reviewed the radiological images as listed and agreed with the findings in the report. IR THORACENTESIS ASP PLEURAL SPACE W/IMG GUIDE  Result Date: 02/22/2022 INDICATION: Patient with history of shortness of breath, found to have bilateral pleural effusions. Request is for therapeutic right-sided thoracentesis EXAM: ULTRASOUND GUIDED RIGHT-SIDED THERAPEUTIC THORACENTESIS MEDICATIONS: None. COMPLICATIONS: None immediate. PROCEDURE: An ultrasound guided thoracentesis was thoroughly discussed with the patient and questions answered. The benefits, risks, alternatives and complications were also  discussed. The patient understands and wishes to proceed with the procedure. Written consent was obtained. Ultrasound was performed to localize and mark an adequate pocket of fluid in the right chest. The area was then prepped and draped in the normal sterile fashion. 1% Lidocaine was used for local anesthesia. Under ultrasound guidance a 6 Fr Safe-T-Centesis catheter was introduced. Thoracentesis was performed. The catheter was removed and a dressing applied. FINDINGS: A total of approximately 850 mL of straw-colored fluid was removed. IMPRESSION: Successful ultrasound guided right-sided therapeutic thoracentesis yielding 850 mL of pleural fluid. Read by: Rushie Nyhan, NP Electronically Signed   By: Albin Felling M.D.   On: 02/22/2022 16:42   DG Chest 1 View  Result Date: 02/22/2022 CLINICAL DATA:  Post right thoracentesis EXAM: CHEST  1 VIEW COMPARISON:  02/21/2022 FINDINGS: Left-sided implanted cardiac device remains in place. Stable heart size. Aortic atherosclerosis. Decreased right-sided pleural effusion. No significant residual right-sided effusion. Trace left pleural effusion. No pneumothorax. IMPRESSION: Decreased right-sided pleural effusion following thoracentesis. No pneumothorax. Electronically Signed   By: Davina Poke D.O.   On: 02/22/2022 16:00   IR THORACENTESIS ASP PLEURAL SPACE W/IMG GUIDE  Result Date: 02/21/2022 INDICATION: Shortness breath, previous chest x-ray showed bilateral pleural effusion. Request for therapeutic thoracentesis. EXAM: ULTRASOUND GUIDED LEFT THORACENTESIS MEDICATIONS: 10 mL 1% lidocaine COMPLICATIONS: None immediate. PROCEDURE: An ultrasound guided thoracentesis was thoroughly discussed with the patient and questions answered. The benefits, risks, alternatives and complications were also discussed. The patient understands and wishes to proceed with the procedure. Written consent was obtained. Ultrasound was performed to localize and mark an adequate  pocket of fluid in the left chest. The area was then prepped and draped in the normal sterile fashion. 1% Lidocaine was used for local anesthesia. Under ultrasound guidance a 6 Fr Safe-T-Centesis catheter was introduced. Thoracentesis was performed. The catheter was removed and a dressing applied. FINDINGS: A total of approximately 900 mL of hazy amber fluid was removed. Post procedure chest X-ray reviewed, negative for pneumothorax. IMPRESSION: Successful ultrasound guided left thoracentesis yielding 900 mL of pleural fluid. Read by: Durenda Guthrie, PA-C Electronically Signed   By: Corrie Mckusick D.O.   On: 02/21/2022 17:00   DG Chest 1 View  Result Date: 02/21/2022 CLINICAL DATA:  Shortness of  breath.  Left-sided thoracentesis EXAM: CHEST  1 VIEW COMPARISON:  02/20/2022 FINDINGS: Left-sided implanted cardiac device remains in place. Stable heart size. Aortic atherosclerosis. Decreased left-sided pleural effusion. No significant residual left-sided effusion. Persistent small right pleural effusion. Improving aeration of the lung bases. No pneumothorax. IMPRESSION: Decreased left-sided pleural effusion status post thoracentesis. No pneumothorax. Electronically Signed   By: Davina Poke D.O.   On: 02/21/2022 14:08   DG Chest 2 View  Result Date: 02/20/2022 CLINICAL DATA:  [id number] with shortness of breath. EXAM: CHEST - 2 VIEW COMPARISON:  Portable chest 02/14/2022 FINDINGS: PA Lat, 4:36 a.m. Left chest AICD is again noted with 2 leads terminating in the right ventricle and 1 in the right atrium. The heart is enlarged. There is increased central vascular prominence, and increased generalized interstitial edema with a basal gradient, with increased small to moderate underlying pleural effusions. There is increased haziness in both lower lung fields which could be atelectasis, consolidation or ground-glass edema. The upper lung fields do not show focal infiltrates. There are scattered calcified granulomas.  Thoracic aorta is heavily calcified with stable mediastinum. Osteopenia. IMPRESSION: Cardiomegaly with interval worsening findings of CHF or fluid overload. There is increased mild-to-moderate interstitial edema with basal gradient, increased small or moderate pleural effusions, and increased hazy interstitial consolidation in the lung bases. Aortic atherosclerosis and remaining findings described above. Electronically Signed   By: Telford Nab M.D.   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  She presented with upper GI bleed and symptomatic anemia, work-up showed hypervascular tumor in the pancreatic head closely involving the duodenum; CTA revealed no other clear source of bleeding or primary site of malignancy; a small right liver lesion was felt to be benign but was not biopsied.   -She had a complicated hospital course and was eventually discharged after 16 days -Outpatient EUS 03/04/2022 by Dr. Rush Landmark showed a T3N0 mass in the pancreatic head/uncinate process with 2 satellite lesions in the tail; biopsy confirmed neuroendocrine tumor of the pancreatic head mass -She is being referred for dotatate PET scan to complete staging, to rule out other primary site or metastatic disease -We discussed the typical indolent nature of neuroendocrine tumors, although hers may be slightly more aggressive; however even if she has no distant metastasis, she is not an ideal surgical candidate due to her age and existing co-morbidities.  We will review her case in our multidisciplinary conference next week -We discussed neuroendocrine tumors are not sensitive to chemo and radiation typically, however if she has recurrent GI bleeding we may consider a course of palliative radiation -We discussed other therapeutic options including the use of monthly Sandostatin  injections; and potential side effects were reviewed mainly including fatigue, diarrhea, and abdominal discomfort. She is interested and agrees to proceed. The goal is palliative/disease control  -We will obtain baseline labs including chromogranin A and urine 5 HIAA today, and monitor periodically -Ms. Blackie appears frail but stable, her performance status is gradually improving after hospitalization.  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  We will monitor as she begins treatment -She will return for first injection in 1-2 weeks, and follow-up a month later with her second injection -Patient seen with Dr. 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  Will replace as needed  Weakness, deconditioning  -Prior to her hospitalization in October she was functional at home, cooking, cleaning, and driving locally -Now she is mostly sedentary, requires a walker, and is on 2-3 L of oxygen; she is profoundly deconditioned -She gets PT at home through [facility] once a week, I encouraged her to ask to increase to 3 times a week -Her fatigue and weakness seem out of proportion to the low disease burden, but certainly could be related, and  is multifactorial secondary to her anemia and other comorbidities  COVID-19, new oxygen requirement, pleural effusions, h/o COPD -She smoked for 30 years in the past, less than 1 pack/day.  She has COPD but no prior oxygen requirement.  -She underwent therapeutic thoracentesis in the hospital with 800-900 cc removed from each lung; cytology not done -Unclear how much of this is related to the neuroendocrine tumor, possibly recent COVID, or other  Afib, CHF, carotid stenosis, HTN, ICD placement -She is on Eliquis for A-fib, will discuss with her cardiologist Dr. [name] to see if she needs to stay on [facility]; if she has recurrent GI bleeding we will recommend for her to stop Eliquis -Most recent echo 02/15/2022 showed LVEF 60-65% -leg edema improving on the current dose of torsemide (60 mg total daily), which is lower dose than her previous requirement. She will f/up with cardiology  -Continue PCP and cardiology follow-up  H/o Renal cell carcinoma and CKD -S/p right nephrectomy in 2003 -Baseline SCr 1.2-1.8 since 2019  Social -Patient's 2 year old great grandson lives with her, he is a Airline pilot and assists around the house -Has 2 children, daughter is present for today's visit -She has strong family support  Goals of care -She met with palliative care team while in the hospital to establish relationship and goals of care -Noted pending hospice referral prior to today's visit, however given her interest in treatment we will hold for now. I spoke to the liaison Freddie Breech, RN -She understands that without surgery this is not curable disease, but still treatable; she is interested in treatment for now; goal is palliative   Plan: -Reviewed hospital course and recent work-up -Labs today -Dotatate PET scan for staging in the next 1-2 weeks -Begin treatment with monthly Sandostatin injection; start in 1-2 week -F/up with second injection in 5-6 weeks -Pt to request increasing PT  -CC note  to cardiology and care team  -Pt seen with Dr. Burr Medico and GI navigator Ihor Gully, RN    Orders Placed This Encounter  Procedures   NM PET DOTATATE SKULL BASE TO MID THIGH    Standing Status:   Future    Standing Expiration Date:   03/21/2023    Order Specific Question:   If indicated for the ordered procedure, I authorize the administration of a radiopharmaceutical per Radiology protocol    Answer:  Yes    Order Specific Question:   Preferred imaging location?    Answer:   [place]   CBC with Differential (Cancer Center Only)    Standing Status:   Standing    Number of Occurrences:   100    Standing Expiration Date:   03/21/2023   CMP ([facility] only)    Standing Status:   Standing    Number of Occurrences:   100    Standing Expiration Date:   03/21/2023   Ferritin    Standing Status:   Standing    Number of Occurrences:   50    Standing Expiration Date:   03/21/2023   Iron and Iron Binding Capacity ([facility] only)    Standing Status:   Standing    Number of Occurrences:   50    Standing Expiration Date:   03/21/2023   Chromogranin A    Standing Status:   Standing    Number of Occurrences:   50    Standing Expiration Date:   03/21/2023   24 Hr Ur 5 HIAA Qnt    Standing Status:   Standing    Number of Occurrences:   50    Standing Expiration Date:   03/21/2023    All questions were answered. The patient knows to call the clinic with any problems, questions or concerns.     Alla Feeling, NP 03/20/2022   Addendum\  I have seen the patient, examined her. I agree with the assessment and and plan and have edited the notes.   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  This was discovered and diagnosed during her recent hospitalization  for GI bleeding, she  had history of GI bleeding from AVM in the past, required IV iron.  The tumor in pancreatic head likely invades duodenum, with rich collaterals, although endoscopy did not see any tumor in the duodenum or frank bleeding.  Not sure if her GI bleeding is related to the pancreatic neuroendocrine tumor.  She also has a 1.1 cm liver lesion, indeterminate.  Meant dotatate PET scan for further staging, and rule out distant metastasis.  Even if she does not have distal metastasis, she is a poor candidate for Whipple surgery.  Her performance status is low, she is on oxygen and in wheelchair.  I discussed the nature also of neuroendocrine tumor, which is indolent in general.  I discussed the treatment options, and I recommended her to start Sandostatin injection as first-line therapy.  Benefit and potential side effects discussed with her, she is interested.  We will start next 1-2 weeks.  All questions were answered.  Truitt Merle MD  03/20/2022

## 2022-03-20 NOTE — Progress Notes (Signed)
I met with Ms Ruta and her daughter, [name]  after  her consultation with Cira Rue NP-C and  Dr Burr Medico.  I explained my role as a nurse navigator and provided my contact information. I explained the services provided at [facility].  I told them she will be Scheduled for a Dotatate PET scan.   I told them our schedulers will call her with for lab/ f/u, and Sandostatin injections..  All questions were answered. they verbalized understanding.

## 2022-03-21 LAB — CHROMOGRANIN A: Chromogranin A (ng/mL): 149.5 ng/mL — ABNORMAL HIGH (ref 0.0–101.8)

## 2022-03-25 ENCOUNTER — Telehealth: Payer: Self-pay | Admitting: Nurse Practitioner

## 2022-03-25 NOTE — Telephone Encounter (Signed)
Spoke with patient confirming upcoming appointments  

## 2022-03-27 ENCOUNTER — Inpatient Hospital Stay: Payer: PPO

## 2022-03-27 ENCOUNTER — Telehealth: Payer: Self-pay

## 2022-03-27 ENCOUNTER — Other Ambulatory Visit: Payer: Self-pay

## 2022-03-27 ENCOUNTER — Encounter: Payer: Self-pay | Admitting: Nurse Practitioner

## 2022-03-27 ENCOUNTER — Other Ambulatory Visit: Payer: Self-pay | Admitting: *Deleted

## 2022-03-27 VITALS — BP 153/59 | HR 58 | Temp 98.1°F | Resp 16

## 2022-03-27 DIAGNOSIS — D508 Other iron deficiency anemias: Secondary | ICD-10-CM

## 2022-03-27 DIAGNOSIS — D3A8 Other benign neuroendocrine tumors: Secondary | ICD-10-CM

## 2022-03-27 MED ORDER — OCTREOTIDE ACETATE 20 MG IM KIT
20.0000 mg | PACK | Freq: Once | INTRAMUSCULAR | Status: AC
  Administered 2022-03-27: 20 mg via INTRAMUSCULAR
  Filled 2022-03-27: qty 1

## 2022-03-27 NOTE — Telephone Encounter (Signed)
Called patients daughter [name] made her aware of Pet Scan appointment for December 6 @ 9am.

## 2022-03-27 NOTE — Progress Notes (Signed)
I saw patient in flush for first Sandostatin. She is feeling OK. Still seeing some "pinkish" output with BMs. Not losing large amount of blood. I let her know cardiology recommends to stay on eliquis given the risks of permanent Afib. She agrees. We will monitor CBC closer.   I also reviewed our conference discussion that Dr. Zenia Resides does not recommend surgery in her age, co-morbidities, and multifocal NET. Pt also agrees.   The recommendation is to proceed with monthly Sandostatin inj as first line. Will start today. She will return for lab and dotatate PET scan on 12/6, then f/up and second inj in 4 weeks. No further needs at this time.   [name] Rue, NP

## 2022-03-27 NOTE — Progress Notes (Signed)
The proposed treatment discussed in conference is for discussion purpose only and is not a binding recommendation.  The patients have not been physically examined, or presented with their treatment options.  Therefore, final treatment plans cannot be decided.  

## 2022-03-27 NOTE — Patient Instructions (Signed)
Octreotide Injection Solution What is this medication? OCTREOTIDE (ok TREE oh tide) treats high levels of growth hormone (acromegaly). It works by reducing the amount of growth hormone your body makes. This reduces symptoms and the risk of health problems caused by too much growth hormone, such as diabetes and heart disease. It may also be used to treat diarrhea caused by neuroendocrine tumors. It works by slowing down the release of serotonin from the tumor cells. This reduces the number of bowel movements you have. This medicine may be used for other purposes; ask your health care provider or pharmacist if you have questions. COMMON BRAND NAME(S): Bynfezia, Sandostatin What should I tell my care team before I take this medication? They need to know if you have any of these conditions: Diabetes Gallbladder disease Kidney disease Liver disease Thyroid disease An unusual or allergic reaction to octreotide, other medications, foods, dyes, or preservatives Pregnant or trying to get pregnant Breast-feeding How should I use this medication? This medication is injected under the skin or into a vein. It is usually given by your care team in a hospital or clinic setting. If you get this medication at home, you will be taught how to prepare and give it. Use exactly as directed. Take it as directed on the prescription label at the same time every day. Keep taking it unless your care team tells you to stop. Allow the injection solution to come to room temperature before use. Do not warm it artificially. It is important that you put your used needles and syringes in a special sharps container. Do not put them in a trash can. If you do not have a sharps container, call your pharmacist or care team to get one. Talk to your care team about the use of this medication in children. Special care may be needed. Overdosage: If you think you have taken too much of this medicine contact a poison control center or  emergency room at once. NOTE: This medicine is only for you. Do not share this medicine with others. What if I miss a dose? If you miss a dose, take it as soon as you can. If it is almost time for your next dose, take only that dose. Do not take double or extra doses. What may interact with this medication? Bromocriptine Certain medications for blood pressure, heart disease, irregular heartbeat Cyclosporine Diuretics Medications for diabetes, including insulin Quinidine This list may not describe all possible interactions. Give your health care provider a list of all the medicines, herbs, non-prescription drugs, or dietary supplements you use. Also tell them if you smoke, drink alcohol, or use illegal drugs. Some items may interact with your medicine. What should I watch for while using this medication? Visit your care team for regular checks on your progress. Tell your care team if your symptoms do not start to get better or if they get worse. To help reduce irritation at the injection site, use a different site for each injection and make sure the solution is at room temperature before use. This medication may cause decreases in blood sugar. Signs of low blood sugar include chills, cool, pale skin or cold sweats, drowsiness, extreme hunger, fast heartbeat, headache, nausea, nervousness or anxiety, shakiness, trembling, unsteadiness, tiredness, or weakness. Contact your care team right away if you experience any of these symptoms. This medication may increase blood sugar. The risk may be higher in patients who already have diabetes. Ask your care team what you can do to lower your   risk of diabetes while taking this medication. You should make sure you get enough vitamin B12 while you are taking this medication. Discuss the foods you eat and the vitamins you take with your care team. What side effects may I notice from receiving this medication? Side effects that you should report to your care  team as soon as possible: Allergic reactions--skin rash, itching, hives, swelling of the face, lips, tongue, or throat Gallbladder problems--severe stomach pain, nausea, vomiting, fever Heart rhythm changes--fast or irregular heartbeat, dizziness, feeling faint or lightheaded, chest pain, trouble breathing High blood sugar (hyperglycemia)--increased thirst or amount of urine, unusual weakness or fatigue, blurry vision Low blood sugar (hypoglycemia)--tremors or shaking, anxiety, sweating, cold or clammy skin, confusion, dizziness, rapid heartbeat Low thyroid levels (hypothyroidism)--unusual weakness or fatigue, increased sensitivity to cold, constipation, hair loss, dry skin, weight gain, feelings of depression Low vitamin B12 level--pain, tingling, or numbness in the hands or feet, muscle weakness, dizziness, confusion, trouble concentrating Pancreatitis--severe stomach pain that spreads to your back or gets worse after eating or when touched, fever, nausea, vomiting Side effects that usually do not require medical attention (report to your care team if they continue or are bothersome): Diarrhea Dizziness Gas Headache Pain, redness, or irritation at injection site Stomach pain This list may not describe all possible side effects. Call your doctor for medical advice about side effects. You may report side effects to FDA at 1-800-FDA-1088. Where should I keep my medication? Keep out of the reach of children and pets. Store in the refrigerator. Protect from light. Allow to come to room temperature naturally. Do not use artificial heat. If protected from light, the injection may be stored between 20 and 30 degrees C (70 and 86 degrees F) for 14 days. After the initial use, throw away any unused portion of a multiple dose vial after 14 days. Get rid of any unused portions of the ampules after use. To get rid of medications that are no longer needed or have expired: Take the medication to a medication  take-back program. Ask your pharmacy or law enforcement to find a location. If you cannot return the medication, ask your pharmacist or care team how to get rid of the medication safely. NOTE: This sheet is a summary. It may not cover all possible information. If you have questions about this medicine, talk to your doctor, pharmacist, or health care provider.  2023 Elsevier/Gold Standard (2021-07-25 00:00:00)  

## 2022-03-31 LAB — 5 HIAA, QUANTITATIVE, URINE, 24 HOUR
5-HIAA, Ur: 2.3 mg/L
5-HIAA,Quant.,24 Hr Urine: 2.2 mg/24 hr (ref 0.0–14.9)
Total Volume: 950

## 2022-04-03 ENCOUNTER — Inpatient Hospital Stay: Payer: PPO

## 2022-04-03 ENCOUNTER — Ambulatory Visit: Payer: PPO | Admitting: Family Medicine

## 2022-04-03 ENCOUNTER — Telehealth: Payer: Self-pay | Admitting: Family Medicine

## 2022-04-03 DIAGNOSIS — J9691 Respiratory failure, unspecified with hypoxia: Secondary | ICD-10-CM

## 2022-04-03 DIAGNOSIS — J9690 Respiratory failure, unspecified, unspecified whether with hypoxia or hypercapnia: Secondary | ICD-10-CM | POA: Insufficient documentation

## 2022-04-03 DIAGNOSIS — Z9981 Dependence on supplemental oxygen: Secondary | ICD-10-CM | POA: Insufficient documentation

## 2022-04-03 NOTE — Telephone Encounter (Signed)
Portable oxygen tank Rx written and in Lisa's box.  Looks like she's schedule for transfer of care from Dr Einar Pheasant visit for 04/17/2022.

## 2022-04-03 NOTE — Telephone Encounter (Signed)
Pt called requesting a proscription for a portable oxygen be fax to [facility]  fax [phone] . Please advise # [phone]

## 2022-04-03 NOTE — Telephone Encounter (Signed)
Faxed written rx DME order to [facility] at [phone].

## 2022-04-08 NOTE — Telephone Encounter (Addendum)
Rx written and in Lisa's box.  Please place same dx codes as previously used.

## 2022-04-08 NOTE — Telephone Encounter (Signed)
Patient called and stated she needs a portable oxygen concentrator and it needs to say that on the fax or [employer] won't fill it. Fax number [phone]. Call back number [phone]

## 2022-04-10 ENCOUNTER — Ambulatory Visit: Payer: PPO | Admitting: Family Medicine

## 2022-04-10 ENCOUNTER — Inpatient Hospital Stay: Payer: PPO

## 2022-04-10 ENCOUNTER — Inpatient Hospital Stay: Payer: PPO | Admitting: Nurse Practitioner

## 2022-04-10 ENCOUNTER — Ambulatory Visit (HOSPITAL_COMMUNITY)
Admission: RE | Admit: 2022-04-10 | Discharge: 2022-04-10 | Disposition: A | Discharge status: Home / Self Care | Payer: PPO | Source: Ambulatory Visit | Attending: Nurse Practitioner | Admitting: Nurse Practitioner

## 2022-04-10 ENCOUNTER — Inpatient Hospital Stay: Payer: PPO | Attending: Nurse Practitioner

## 2022-04-10 DIAGNOSIS — D3A8 Other benign neuroendocrine tumors: Secondary | ICD-10-CM | POA: Insufficient documentation

## 2022-04-10 DIAGNOSIS — C7A8 Other malignant neuroendocrine tumors: Secondary | ICD-10-CM | POA: Diagnosis not present

## 2022-04-10 DIAGNOSIS — D509 Iron deficiency anemia, unspecified: Secondary | ICD-10-CM | POA: Diagnosis not present

## 2022-04-10 DIAGNOSIS — R918 Other nonspecific abnormal finding of lung field: Secondary | ICD-10-CM | POA: Diagnosis not present

## 2022-04-10 LAB — CBC WITH DIFFERENTIAL (CANCER CENTER ONLY)
Abs Immature Granulocytes: 0.01 10*3/uL (ref 0.00–0.07)
Basophils Absolute: 0 10*3/uL (ref 0.0–0.1)
Basophils Relative: 0 %
Eosinophils Absolute: 0 10*3/uL (ref 0.0–0.5)
Eosinophils Relative: 0 %
HCT: 31.6 % — ABNORMAL LOW (ref 36.0–46.0)
Hemoglobin: 9.8 g/dL — ABNORMAL LOW (ref 12.0–15.0)
Immature Granulocytes: 0 %
Lymphocytes Relative: 12 %
Lymphs Abs: 0.3 10*3/uL — ABNORMAL LOW (ref 0.7–4.0)
MCH: 29.8 pg (ref 26.0–34.0)
MCHC: 31 g/dL (ref 30.0–36.0)
MCV: 96 fL (ref 80.0–100.0)
Monocytes Absolute: 0.4 10*3/uL (ref 0.1–1.0)
Monocytes Relative: 13 %
Neutro Abs: 2.1 10*3/uL (ref 1.7–7.7)
Neutrophils Relative %: 75 %
Platelet Count: 158 10*3/uL (ref 150–400)
RBC: 3.29 MIL/uL — ABNORMAL LOW (ref 3.87–5.11)
RDW: 15 % (ref 11.5–15.5)
WBC Count: 2.8 10*3/uL — ABNORMAL LOW (ref 4.0–10.5)
nRBC: 0 % (ref 0.0–0.2)

## 2022-04-10 LAB — CMP (CANCER CENTER ONLY)
ALT: <5 U/L (ref 0–44)
AST: 13 U/L — ABNORMAL LOW (ref 15–41)
Albumin: 4.1 g/dL (ref 3.5–5.0)
Alkaline Phosphatase: 48 U/L (ref 38–126)
Anion gap: 5 (ref 5–15)
BUN: 24 mg/dL — ABNORMAL HIGH (ref 8–23)
CO2: 34 mmol/L — ABNORMAL HIGH (ref 22–32)
Calcium: 9.7 mg/dL (ref 8.9–10.3)
Chloride: 99 mmol/L (ref 98–111)
Creatinine: 1.42 mg/dL — ABNORMAL HIGH (ref 0.44–1.00)
GFR, Estimated: 36 mL/min — ABNORMAL LOW (ref >60)
Glucose, Bld: 106 mg/dL — ABNORMAL HIGH (ref 70–99)
Potassium: 4.3 mmol/L (ref 3.5–5.1)
Sodium: 138 mmol/L (ref 135–145)
Total Bilirubin: 0.7 mg/dL (ref 0.3–1.2)
Total Protein: 7.3 g/dL (ref 6.5–8.1)

## 2022-04-10 MED ORDER — COPPER CU 64 DOTATATE 1 MCI/ML IV SOLN
4.0000 | Freq: Once | INTRAVENOUS | Status: AC
  Administered 2022-04-10: 4 via INTRAVENOUS

## 2022-04-10 NOTE — Telephone Encounter (Signed)
Faxed new written rx DME order to [facility] at [phone].  Spoke with pt notifying her of above. Pt expresses her thanks.

## 2022-04-10 NOTE — Telephone Encounter (Signed)
Patient called and asked of the fax sent yet.

## 2022-04-11 ENCOUNTER — Ambulatory Visit: Payer: PPO | Admitting: Internal Medicine

## 2022-04-17 ENCOUNTER — Ambulatory Visit: Payer: PPO | Admitting: Family Medicine

## 2022-04-18 ENCOUNTER — Inpatient Hospital Stay: Payer: PPO

## 2022-04-18 ENCOUNTER — Other Ambulatory Visit: Payer: Self-pay

## 2022-04-18 ENCOUNTER — Encounter: Payer: Self-pay | Admitting: Nurse Practitioner

## 2022-04-18 ENCOUNTER — Inpatient Hospital Stay (HOSPITAL_BASED_OUTPATIENT_CLINIC_OR_DEPARTMENT_OTHER): Payer: PPO | Admitting: Nurse Practitioner

## 2022-04-18 VITALS — BP 139/62 | HR 60 | Temp 97.4°F | Resp 16 | Wt 138.2 lb

## 2022-04-18 DIAGNOSIS — D3A8 Other benign neuroendocrine tumors: Secondary | ICD-10-CM

## 2022-04-18 DIAGNOSIS — J90 Pleural effusion, not elsewhere classified: Secondary | ICD-10-CM | POA: Diagnosis not present

## 2022-04-18 DIAGNOSIS — M6281 Muscle weakness (generalized): Secondary | ICD-10-CM | POA: Diagnosis not present

## 2022-04-18 DIAGNOSIS — Z4789 Encounter for other orthopedic aftercare: Secondary | ICD-10-CM | POA: Diagnosis not present

## 2022-04-18 DIAGNOSIS — D508 Other iron deficiency anemias: Secondary | ICD-10-CM

## 2022-04-18 DIAGNOSIS — I5033 Acute on chronic diastolic (congestive) heart failure: Secondary | ICD-10-CM | POA: Diagnosis not present

## 2022-04-18 DIAGNOSIS — J449 Chronic obstructive pulmonary disease, unspecified: Secondary | ICD-10-CM | POA: Diagnosis not present

## 2022-04-18 DIAGNOSIS — R2689 Other abnormalities of gait and mobility: Secondary | ICD-10-CM | POA: Diagnosis not present

## 2022-04-18 LAB — CBC WITH DIFFERENTIAL (CANCER CENTER ONLY)
Abs Immature Granulocytes: 0.01 10*3/uL (ref 0.00–0.07)
Basophils Absolute: 0 10*3/uL (ref 0.0–0.1)
Basophils Relative: 1 %
Eosinophils Absolute: 0 10*3/uL (ref 0.0–0.5)
Eosinophils Relative: 0 %
HCT: 32.1 % — ABNORMAL LOW (ref 36.0–46.0)
Hemoglobin: 9.9 g/dL — ABNORMAL LOW (ref 12.0–15.0)
Immature Granulocytes: 0 %
Lymphocytes Relative: 15 %
Lymphs Abs: 0.5 10*3/uL — ABNORMAL LOW (ref 0.7–4.0)
MCH: 29.4 pg (ref 26.0–34.0)
MCHC: 30.8 g/dL (ref 30.0–36.0)
MCV: 95.3 fL (ref 80.0–100.0)
Monocytes Absolute: 0.4 10*3/uL (ref 0.1–1.0)
Monocytes Relative: 10 %
Neutro Abs: 2.5 10*3/uL (ref 1.7–7.7)
Neutrophils Relative %: 74 %
Platelet Count: 193 10*3/uL (ref 150–400)
RBC: 3.37 MIL/uL — ABNORMAL LOW (ref 3.87–5.11)
RDW: 15.2 % (ref 11.5–15.5)
WBC Count: 3.4 10*3/uL — ABNORMAL LOW (ref 4.0–10.5)
nRBC: 0 % (ref 0.0–0.2)

## 2022-04-18 LAB — CMP (CANCER CENTER ONLY)
ALT: 6 U/L (ref 0–44)
AST: 13 U/L — ABNORMAL LOW (ref 15–41)
Albumin: 4.3 g/dL (ref 3.5–5.0)
Alkaline Phosphatase: 48 U/L (ref 38–126)
Anion gap: 7 (ref 5–15)
BUN: 29 mg/dL — ABNORMAL HIGH (ref 8–23)
CO2: 34 mmol/L — ABNORMAL HIGH (ref 22–32)
Calcium: 9.7 mg/dL (ref 8.9–10.3)
Chloride: 98 mmol/L (ref 98–111)
Creatinine: 1.32 mg/dL — ABNORMAL HIGH (ref 0.44–1.00)
GFR, Estimated: 39 mL/min — ABNORMAL LOW (ref >60)
Glucose, Bld: 100 mg/dL — ABNORMAL HIGH (ref 70–99)
Potassium: 4.2 mmol/L (ref 3.5–5.1)
Sodium: 139 mmol/L (ref 135–145)
Total Bilirubin: 0.6 mg/dL (ref 0.3–1.2)
Total Protein: 7.4 g/dL (ref 6.5–8.1)

## 2022-04-18 MED ORDER — OCTREOTIDE ACETATE 20 MG IM KIT
20.0000 mg | PACK | Freq: Once | INTRAMUSCULAR | Status: AC
  Administered 2022-04-18: 20 mg via INTRAMUSCULAR
  Filled 2022-04-18: qty 1

## 2022-04-18 NOTE — Progress Notes (Signed)
[place]   Telephone:([phone] Fax:(336) [phone]   Clinic Follow up Note   Patient Care Team: Ria Bush, MD as PCP - General (Family Medicine) Deboraha Sprang, MD as PCP - Cardiology (Cardiology) Larey Dresser, MD as PCP - Advanced Heart Failure (Cardiology) Minna Merritts, MD as Consulting Physician (Cardiology) Deboraha Sprang, MD as Consulting Physician (Cardiology) [name], Dolores Lory, MD as Consulting Physician (Vascular Surgery) Debbora Dus, [facility] as Pharmacist (Pharmacist) Sindy Guadeloupe, MD as Consulting Physician (Hematology and Oncology) Truitt Merle, MD as Consulting Physician (Oncology) 04/18/2022  CHIEF COMPLAINT: Follow-up NET of the pancreas  CURRENT THERAPY: First line Sandostatin injection q. 28 days starting 03/27/2022  INTERVAL HISTORY: [name] returns for follow up and treatment as scheduled. Last seen by me as a new consult 03/20/22. She began [facility] 11/22. She was dizzy about 2 hours after the injection, this resolved. She feels stronger, doing more around the house. On 2 L oxygen but sometimes walks without it, but always near her. She really would like our help getting a portable tank. She is eating and drinking well. Diarrhea now less often, once every couple weeks. No more bleeding. She has flushing which has also improved. Chronic issues such as back pain and intermittent headaches are stable.   All other systems were reviewed with the patient and are negative.  MEDICAL HISTORY:  Past Medical History:  Diagnosis Date   Actinic keratosis 01/17/2015   R forearm   Adjustment disorder with anxiety    Anemia    Anginal pain ([place])    Arthritis    "some in my hands" (11/11/2012)   Atrial fibrillation (HCC)    Atypical mole 03/25/2018   L forearm - severe   Automatic implantable cardioverter-defibrillator in situ    Avascular necrosis of hip ([place]) 05/03/2011   Blood in stool    Carotid artery stenosis 09/2007   a.  09/2007: 60-79% bilateral (stable); b. 10/2008: 40-59% R 60-79%    Chest pain, unspecified    Chronic airway obstruction, not elsewhere classified    Chronic bronchitis ([place])    "get it some; not q year" (11/11/2012)   Chronic diastolic CHF (congestive heart failure) ([place]) 06/04/2013   Chronic kidney disease, unspecified    Coronary artery disease    non-obstructive by 2006 cath   Displaced fracture of left femoral neck ([place]) 10/09/2018   Frequent UTI    "get them a couple times/yr" (11/11/2012)   GI bleed 03/28/2020   Hemorrhage of rectum and anus    High cholesterol    History of blood transfusion 04/2011   "after hip OR" (11/11/2012)   Hx of renal cell cancer    Hypertension 05/20/2011   Hypertr obst cardiomyop    Hypotension, unspecified    cardiac cath 2006..nonobstructive CAD 30-40s lesions.Marland KitchenETT 1/09 nondiagnostic due to poor HR response..Right Renal Cancer 2003   Long term (current) use of anticoagulants    Malignant neoplasm of kidney, except pelvis    Osteoarthritis of right hip    Other and unspecified coagulation defects    PONV (postoperative nausea and vomiting)    Presence of permanent cardiac pacemaker    RECTAL BLEEDING 10/13/2009   Qualifier: Diagnosis of  By: Chester Holstein NP, Paula     Renal cancer [facility]) 06/2001   Right   Secondary cardiomyopathy, unspecified    Sinus bradycardia    Squamous cell carcinoma of skin 01/17/2015   R lat wrist   Squamous cell carcinoma of  skin 01/29/2018   R post upper leg - superficially invasive   Squamous cell carcinoma of skin 03/25/2018   L lat foot   Squamous cell carcinoma of skin 11/09/2020   left lat foot - EDC 01/01/21, recurrent 01/31/21 - MOHs 02/22/21   Squamous cell carcinoma of skin 12/19/2021   Right Posterior Medial Thigh, EDC   Squamous cell carcinoma of skin 12/19/2021   SCC IS, L lat heel, EDC 02/04/2022   Squamous cell carcinoma of skin 01/21/2022   L forearm, EDC 02/04/2022   Squamous cell carcinoma of skin  01/21/2022   R lower leg below knee, EDC 02/04/2022   Squamous cell carcinoma of skin 01/21/2022   SCCIS, R post heel, EDC 02/04/2022   Urge incontinence     SURGICAL HISTORY: Past Surgical History:  Procedure Laterality Date   ABDOMINAL AORTOGRAM W/LOWER EXTREMITY N/A 02/12/2021   Procedure: ABDOMINAL AORTOGRAM W/LOWER EXTREMITY;  Surgeon: Waynetta Sandy, MD;  Location: [place] CV LAB;  Service: Cardiovascular;  Laterality: N/A;   ABDOMINAL HYSTERECTOMY  1975   for benign causes   APPENDECTOMY     BI-VENTRICULAR PACEMAKER UPGRADE  05/04/2010   BIOPSY  02/28/2019   Procedure: BIOPSY;  Surgeon: Milus Banister, MD;  Location: [facility] ENDOSCOPY;  Service: Endoscopy;;   BIOPSY  03/04/2022   Procedure: BIOPSY;  Surgeon: Irving Copas., MD;  Location: [facility] END[facility];  Service: Gastroenterology;;   CARDIAC CATHETERIZATION  2006   CARDIOVERSION N/A 02/20/2018   Procedure: CARDIOVERSION;  Surgeon: Minna Merritts, MD;  Location: [place] [facility];  Service: Cardiovascular;  Laterality: N/A;   CARDIOVERSION N/A 03/27/2018   Procedure: CARDIOVERSION;  Surgeon: Minna Merritts, MD;  Location: [facility] ORS;  Service: Cardiovascular;  Laterality: N/A;   CATARACT EXTRACTION W/ INTRAOCULAR LENS  IMPLANT, BILATERAL  01/2006-02-2006   CHOLECYSTECTOMY N/A 11/11/2012   Procedure: LAPAROSCOPIC CHOLECYSTECTOMY WITH INTRAOPERATIVE CHOLANGIOGRAM;  Surgeon: Imogene Burn. Georgette Dover, MD;  Location: [place];  Service: General;  Laterality: N/A;   COLONOSCOPY WITH PROPOFOL N/A 02/28/2019   Procedure: COLONOSCOPY WITH PROPOFOL;  Surgeon: Milus Banister, MD;  Location: [facility] ENDOSCOPY;  Service: Endoscopy;  Laterality: N/A;   ENTEROSCOPY N/A 03/30/2020   Procedure: ENTEROSCOPY;  Surgeon: Lavena Bullion, DO;  Location: [place];  Service: Gastroenterology;  Laterality: N/A;   ENTEROSCOPY N/A 02/10/2021   Procedure: ENTEROSCOPY;  Surgeon: Carol Ada, MD;  Location: [place];  Service: Endoscopy;   Laterality: N/A;   ENTEROSCOPY N/A 02/15/2022   Procedure: ENTEROSCOPY;  Surgeon: Sharyn Creamer, MD;  Location: [facility] ENDOSCOPY;  Service: Gastroenterology;  Laterality: N/A;   EP IMPLANTABLE DEVICE N/A 02/21/2016   Procedure: ICD Generator Changeout;  Surgeon: Deboraha Sprang, MD;  Location: [place] CV LAB;  Service: Cardiovascular;  Laterality: N/A;   ESOPHAGOGASTRODUODENOSCOPY N/A 03/04/2022   Procedure: ESOPHAGOGASTRODUODENOSCOPY (EGD);  Surgeon: Irving Copas., MD;  Location: [facility];  Service: Gastroenterology;  Laterality: N/A;   ESOPHAGOGASTRODUODENOSCOPY (EGD) WITH PROPOFOL N/A 02/28/2019   Procedure: ESOPHAGOGASTRODUODENOSCOPY (EGD) WITH PROPOFOL;  Surgeon: Milus Banister, MD;  Location: [facility] ENDOSCOPY;  Service: Endoscopy;  Laterality: N/A;   EUS N/A 03/04/2022   Procedure: UPPER ENDOSCOPIC ULTRASOUND (EUS) RADIAL;  Surgeon: Irving Copas., MD;  Location: [facility] ENDOSCOPY;  Service: Gastroenterology;  Laterality: N/A;   FINE NEEDLE ASPIRATION N/A 03/04/2022   Procedure: FINE NEEDLE ASPIRATION (FNA) LINEAR;  Surgeon: Irving Copas., MD;  Location: [facility] ENDOSCOPY;  Service: Gastroenterology;  Laterality: N/A;   GIVENS CAPSULE STUDY N/A 03/15/2019  Procedure: GIVENS CAPSULE STUDY;  Surgeon: Thornton Park, MD;  Location: [place];  Service: Gastroenterology;  Laterality: N/A;   HOT HEMOSTASIS N/A 03/30/2020   Procedure: HOT HEMOSTASIS (ARGON PLASMA COAGULATION/BICAP);  Surgeon: Lavena Bullion, DO;  Location: [facility] ENDOSCOPY;  Service: Gastroenterology;  Laterality: N/A;   HOT HEMOSTASIS N/A 02/10/2021   Procedure: HOT HEMOSTASIS (ARGON PLASMA COAGULATION/BICAP);  Surgeon: Carol Ada, MD;  Location: [place];  Service: Endoscopy;  Laterality: N/A;   HOT HEMOSTASIS N/A 02/15/2022   Procedure: HOT HEMOSTASIS (ARGON PLASMA COAGULATION/BICAP);  Surgeon: Sharyn Creamer, MD;  Location: [place];  Service: Gastroenterology;  Laterality: N/A;   INSERT  / REPLACE / REMOVE PACEMAKER  05-01-11   02-28-05-/05-04-10-ICD-MEDTRONIC MAXIMAL DR   IR ANGIOGRAM SELECTIVE EACH ADDITIONAL VESSEL  02/18/2022   IR ANGIOGRAM SELECTIVE EACH ADDITIONAL VESSEL  02/18/2022   IR ANGIOGRAM VISCERAL SELECTIVE  02/16/2022   IR ANGIOGRAM VISCERAL SELECTIVE  02/16/2022   IR EMBO ART  VEN HEMORR LYMPH EXTRAV  INC GUIDE ROADMAPPING  02/16/2022   IR THORACENTESIS ASP PLEURAL SPACE W/IMG GUIDE  02/21/2022   IR THORACENTESIS ASP PLEURAL SPACE W/IMG GUIDE  02/22/2022   IR US GUIDE VASC ACCESS RIGHT  02/16/2022   JOINT REPLACEMENT     LAPAROSCOPIC CHOLECYSTECTOMY  11/11/2012   LAPAROSCOPIC LYSIS OF ADHESIONS N/A 11/11/2012   Procedure: LAPAROSCOPIC LYSIS OF ADHESIONS;  Surgeon: Imogene Burn. Georgette Dover, MD;  Location: [place];  Service: General;  Laterality: N/A;   NEPHRECTOMY Right 06/2001    S/P RENAL CELL CANCER   PERIPHERAL VASCULAR INTERVENTION Bilateral 02/12/2021   Procedure: PERIPHERAL VASCULAR INTERVENTION;  Surgeon: Waynetta Sandy, MD;  Location: [place] CV LAB;  Service: Cardiovascular;  Laterality: Bilateral;  Iliac artery stents   PRESSURE SENSOR/CARDIOMEMS N/A 02/03/2019   Procedure: PRESSURE SENSOR/CARDIOMEMS;  Surgeon: Larey Dresser, MD;  Location: [place] CV LAB;  Service: Cardiovascular;  Laterality: N/A;   RIGHT HEART CATH N/A 11/09/2018   Procedure: RIGHT HEART CATH;  Surgeon: Larey Dresser, MD;  Location: [place] CV LAB;  Service: Cardiovascular;  Laterality: N/A;   RIGHT HEART CATH N/A 03/08/2019   Procedure: RIGHT HEART CATH;  Surgeon: Larey Dresser, MD;  Location: [place] CV LAB;  Service: Cardiovascular;  Laterality: N/A;   SUBMUCOSAL TATTOO INJECTION  02/28/2019   Procedure: SUBMUCOSAL TATTOO INJECTION;  Surgeon: Milus Banister, MD;  Location: [facility] ENDOSCOPY;  Service: Endoscopy;;   TOTAL HIP ARTHROPLASTY Right 05/03/2011   Procedure: TOTAL HIP ARTHROPLASTY ANTERIOR APPROACH;  Surgeon: Mcarthur Rossetti;  Location: [facility];   Service: Orthopedics;  Laterality: Right;  Removal of Cannulated Screws Right Hip, Right Direct Anterior Hip Replacement   TOTAL HIP ARTHROPLASTY Left 10/09/2018   Procedure: TOTAL HIP ARTHROPLASTY ANTERIOR APPROACH;  Surgeon: Rod Can, MD;  Location: [place];  Service: Orthopedics;  Laterality: Left;    I have reviewed the social history and family history with the patient and they are unchanged from previous note.  ALLERGIES:  is allergic to dilaudid [hydromorphone], fentanyl, codeine, morphine and related, hydrocodone, oxycodone, tramadol, whole blood, and sulfonamide derivatives.  MEDICATIONS:  Current Outpatient Medications  Medication Sig Dispense Refill   acetaminophen (TYLENOL) 500 MG tablet Take 500-1,000 mg by mouth every 8 (eight) hours as needed for mild pain or headache.     apixaban (ELIQUIS) 2.5 MG TABS tablet Take 1 tablet (2.5 mg total) by mouth 2 (two) times daily. 60 tablet 0   Cyanocobalamin (B-12 PO) Take 1 tablet by mouth  daily.     fluticasone (FLONASE) 50 MCG/ACT nasal spray Place 2 sprays into both nostrils daily as needed for allergies or rhinitis. 16 g 6   ondansetron (ZOFRAN) 4 MG tablet Take 1 tablet (4 mg total) by mouth every 8 (eight) hours as needed for nausea or vomiting. 20 tablet 0   pantoprazole (PROTONIX) 40 MG tablet Take 1 tablet (40 mg total) by mouth 2 (two) times daily. 60 tablet 0   potassium chloride SA (KLOR-CON M) 20 MEQ tablet Take 1 tablet (20 mEq total) by mouth daily. 30 tablet 0   torsemide (DEMADEX) 20 MG tablet Take 3 tablets (60 mg total) by mouth daily. 60 tablet 0   vitamin C (ASCORBIC ACID) 250 MG tablet Take 500 mg by mouth daily.     VITAMIN D PO Take 4,000 Units by mouth daily.     albuterol (VENTOLIN HFA) 108 (90 Base) MCG/ACT inhaler Inhale 2 puffs into the lungs every 6 (six) hours as needed for wheezing or shortness of breath. (Patient not taking: Reported on 04/18/2022) 8 g 2   clobetasol cream (TEMOVATE) 0.05 % Apply to  affected area left foot twice a day until improved. Avoid face, groin, axilla. (Patient not taking: Reported on 02/13/2022) 30 g 0   No current facility-administered medications for this visit.   Facility-Administered Medications Ordered in Other Visits  Medication Dose Route Frequency Provider Last Rate Last Admin   octreotide (SANDOSTATIN LAR) IM injection 20 mg  20 mg Intramuscular Once Alla Feeling, NP        PHYSICAL EXAMINATION: ECOG PERFORMANCE STATUS: 1 - Symptomatic but completely ambulatory  Vitals:   04/18/22 1242  BP: 139/62  Pulse: 60  Resp: 16  Temp: (!) 97.4 F (36.3 C)  SpO2: 94%   Filed Weights   04/18/22 1242  Weight: 138 lb 3.2 oz (62.7 kg)    GENERAL:alert, no distress and comfortable SKIN: no rash  EYES: sclera clear LUNGS: normal breathing effort NEURO: alert & oriented x 3 with fluent speech Wheelchair exam  LABORATORY DATA:  I have reviewed the data as listed    Latest Ref Rng & Units 04/18/2022   12:09 PM 04/10/2022    8:44 AM 03/20/2022   12:53 PM  CBC  WBC 4.0 - 10.5 K/uL 3.4  2.8  3.3   Hemoglobin 12.0 - 15.0 g/dL 9.9  9.8  8.8   Hematocrit 36.0 - 46.0 % 32.1  31.6  28.1   Platelets 150 - 400 K/uL 193  158  183         Latest Ref Rng & Units 04/18/2022   12:09 PM 04/10/2022    8:44 AM 03/20/2022   12:53 PM  CMP  Glucose 70 - 99 mg/dL 100  106  85   BUN 8 - 23 mg/dL _0 Creatinine 0.44 - 1.00 mg/dL 1.32  1.42  1.23   Sodium 135 - 145 mmol/L 139  138  141   Potassium 3.5 - 5.1 mmol/L 4.2  4.3  4.0   Chloride 98 - 111 mmol/L 98  99  99   CO2 22 - 32 mmol/L 34  34  35   Calcium 8.9 - 10.3 mg/dL 9.7  9.7  9.5   Total Protein 6.5 - 8.1 g/dL 7.4  7.3  7.6   Total Bilirubin 0.3 - 1.2 mg/dL 0.6  0.7  1.2   Alkaline Phos 38 - 126 U/L 48  48  61  AST 15 - 41 U/L _0 ALT 0 - 44 U/L 6  <5  5       RADIOGRAPHIC STUDIES: I have personally reviewed the radiological images as listed and agreed with the findings in  the report. No results found.   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   -Outpatient EUS 03/04/2022 by Dr. Rush Landmark showed a T3N0 mass in the pancreatic head/uncinate process with 2 satellite lesions in the tail; biopsy confirmed neuroendocrine tumor of the pancreatic head mass -We reviewed her case in GI conference, Dr. Zenia Resides did not recommend surgery in multifocal NET and also due to her age and co-morbidities. -She began first line sandostatin injections q28 days on 03/27/22.  -I reviewed her dotatate PET scan, done 2 weeks after first injection 12/6, which shows no significant tracer avidity in the pancrease or distant sites. Findings are likely due to close timing relative to the sandostatin injection blocking receptors. When we repeat this for restaging, will arrange 4 weeks after sandostatin -[name] appears stable. She tolerated first injection with brief dizziness. Overall, she feels stronger, and flushing and diarrhea have improved. She is likely responding.  -Labs reviewed. Proceed with sandostatin today, continue q4 weeks   Multifactorial anemia, secondary to GI bleed, h/o IDA and B12 deficiency, and anemia of chronic disease -She has a longstanding history of anemia for at least 10 years, likely more; and history of GI bleeding from AVMs -She has received various formulations of IV iron in the past (ferlecit in 2020, Feraheme in 02/2022), B12 injections (last in 2021), and ESA (Retacrit, 2021) -During her most recent hospitalization 02/13/22 - 03/01/2022 she had 2 duodenal angioectasias that were treated with APC, 2 doses of  Feraheme, and a total of 6 units of red cell transfusions (she had a transfusion reaction and required ICU placement and premeds for subsequent transfusions); melena resolved at discharge -hemoglobin stable in the 9 range; ferritin is high, hold additional iron for now   Weakness, deconditioning  -Prior to her hospitalization in October she was functional at home, cooking, cleaning, and driving locally -after hospital d/c she was more sedentary, requires a walker, and is on 2-3 L of oxygen; she is profoundly deconditioned -She gets [name] at home through [facility] -Her fatigue and weakness seem out of proportion to the low disease burden, but certainly could be related, and is multifactorial secondary to her anemia and other comorbidities -She feels stronger after starting treatment   COVID-19, new oxygen requirement, pleural effusions, h/o COPD -She smoked for 30 years in the past, less than 1 pack/day.  She has COPD but no prior oxygen requirement.  -She underwent therapeutic thoracentesis in the hospital with 800-900 cc removed from each lung; cytology not done -Unclear how much of this is related to the neuroendocrine tumor, possibly recent COVID, or other -On 2 L O2, working with PCP and [employer] to get portable oxygen tank   Afib, CHF, carotid stenosis, HTN, ICD placement -She is on Eliquis for permanent Afib, cardiology prefers she stay on this for high risk. -We will monitor hgb closely given her bleeding risk on ac.  -  recent echo 02/15/2022 showed LVEF 60-65% -leg edema improving on the current dose of torsemide (60 mg total daily), which is lower dose than her previous requirement. She will f/up with cardiology  -Continue PCP and cardiology follow-up   H/o Renal cell carcinoma and CKD -S/p right nephrectomy in 2003 -Baseline SCr 1.2-1.8 since 2019 -Stable   Social -Patient's 41 year old great grandson lives with her, he is a Airline pilot and assists around the house -Has 2  children, daughter is present for today's visit -She has strong family support   Goals of care -She met with palliative care team while in the hospital to establish relationship and goals of care -Noted pending hospice referral prior to today's visit, however given her interest in treatment we will hold for now. I spoke to the liaison Freddie Breech, RN -She understands that without surgery this is not curable disease, but still treatable; she is interested in treatment for now; goal is palliative   PLAN: -Labs and PET scan reviewed with [name] and Dr. Burr Medico -Continue Sandostatin q4 weeks; proceed with today's dose -Will help to get her portable O2 tank if we can, my nurse is working with [place] and PCP -F/up and Sandostatin in 4 weeks   All questions were answered. The patient knows to call the clinic with any problems, questions or concerns. No barriers to learning was detected. I spent 20 minutes counseling the patient face to face. The total time spent in the appointment was 40 minutes and more than 50% was on counseling and review of test results and coordination of care.      Alla Feeling, NP 04/18/22

## 2022-04-19 LAB — CHROMOGRANIN A: Chromogranin A (ng/mL): 59.4 ng/mL (ref 0.0–101.8)

## 2022-04-22 ENCOUNTER — Telehealth: Payer: Self-pay | Admitting: Nurse Practitioner

## 2022-04-22 ENCOUNTER — Telehealth: Payer: Self-pay

## 2022-04-22 NOTE — Telephone Encounter (Addendum)
Called patient and relayed message as per Cira Rue NP      ----- Message from Alla Feeling, NP sent at 04/21/2022  9:53 PM EST ----- Please let pt know her tumor marker improved/normalized on treatment, which is great. Good sign she is likely responding to injection.  Thanks, Regan Rakers NP

## 2022-04-22 NOTE — Telephone Encounter (Signed)
Called patient to confirm appointments. Patient notified.  

## 2022-04-23 ENCOUNTER — Inpatient Hospital Stay: Payer: PPO

## 2022-04-23 ENCOUNTER — Inpatient Hospital Stay: Payer: PPO | Admitting: Nurse Practitioner

## 2022-04-24 ENCOUNTER — Inpatient Hospital Stay: Payer: PPO

## 2022-04-24 ENCOUNTER — Ambulatory Visit (INDEPENDENT_AMBULATORY_CARE_PROVIDER_SITE_OTHER): Payer: PPO | Admitting: Family Medicine

## 2022-04-24 ENCOUNTER — Encounter: Payer: Self-pay | Admitting: Family Medicine

## 2022-04-24 ENCOUNTER — Inpatient Hospital Stay: Payer: PPO | Admitting: Nurse Practitioner

## 2022-04-24 VITALS — BP 122/60 | HR 64 | Temp 97.9°F | Ht 66.5 in

## 2022-04-24 DIAGNOSIS — D3A8 Other benign neuroendocrine tumors: Secondary | ICD-10-CM

## 2022-04-24 DIAGNOSIS — D649 Anemia, unspecified: Secondary | ICD-10-CM

## 2022-04-24 DIAGNOSIS — Z905 Acquired absence of kidney: Secondary | ICD-10-CM

## 2022-04-24 DIAGNOSIS — N1832 Chronic kidney disease, stage 3b: Secondary | ICD-10-CM | POA: Diagnosis not present

## 2022-04-24 DIAGNOSIS — J9691 Respiratory failure, unspecified with hypoxia: Secondary | ICD-10-CM

## 2022-04-24 DIAGNOSIS — K5521 Angiodysplasia of colon with hemorrhage: Secondary | ICD-10-CM | POA: Diagnosis not present

## 2022-04-24 DIAGNOSIS — Z95810 Presence of automatic (implantable) cardiac defibrillator: Secondary | ICD-10-CM

## 2022-04-24 DIAGNOSIS — I5032 Chronic diastolic (congestive) heart failure: Secondary | ICD-10-CM | POA: Diagnosis not present

## 2022-04-24 DIAGNOSIS — E782 Mixed hyperlipidemia: Secondary | ICD-10-CM | POA: Diagnosis not present

## 2022-04-24 DIAGNOSIS — Z87898 Personal history of other specified conditions: Secondary | ICD-10-CM | POA: Diagnosis not present

## 2022-04-24 DIAGNOSIS — I4821 Permanent atrial fibrillation: Secondary | ICD-10-CM

## 2022-04-24 DIAGNOSIS — R5382 Chronic fatigue, unspecified: Secondary | ICD-10-CM | POA: Diagnosis not present

## 2022-04-24 DIAGNOSIS — E039 Hypothyroidism, unspecified: Secondary | ICD-10-CM | POA: Diagnosis not present

## 2022-04-24 NOTE — Progress Notes (Addendum)
Patient ID: [name], female    DOB: 1934/04/22, 88 y.o.   MRN: [id number]  This visit was conducted in person.  BP 122/60   Pulse 64   Temp 97.9 F (36.6 C) (Temporal)   Ht 5' 6.5" (1.689 m)   SpO2 100% Comment: 2 L  BMI 21.97 kg/m   Pulse Readings from Last 3 Encounters:  04/24/22 64  04/18/22 60  03/27/22 (!) 58   CC: hosp f/u visit  Subjective:   HPI: [name] is a 88 y.o. female presenting on 04/24/2022 for Hospitalization Follow-up (Admitted on 03/04/22 at [facility], dx pancreatic mass. Pt accompanied by niece, [name]. )   Transfer of care from Dr [name]. I last saw patient 02/2022 - at that time with COVID diagnosis as well as new symptomatic anemia - sent to ER.   Hospitalized from 10/11 - 27/2023. Records reviewed. Diagnosed with GI bleed s/p cauterization of 2 nonbleeding AVMs. Abdominal imaging showed hypervascular pancreatic mass. Underwent IR guided GDA/pancreatic duodenal artery embolization on 10/51/2023. Discharged off Eliquis (PAF), on PPI BID. Declined SNF rec, discharged to home with [facility] and extensive family support. She had blood transfusion complicated by transfusion reaction - needs premedication with tylenol, benadryl, steroids for future transfusion needs. Found to have bilat pleural effusions, underwent L sided thoracocentesis 900cc fluid removed. COVID PNA treated with 5d IV zosyn.   Rpt EGD 03/04/2022 ([facility]) - 3cm HH, erosive gastropathy without bleeding, duodenal erosion previously treated with APC, EUS at that time with pancreatic head/uncinate mass suggestive of neuroendocrine tumor, as well as 2 other lesions in pancreatic tail ?synchronous neuroendocrine tumors. Biopsy confirmed neuroendocrine tumor of the pancreatic head mass   She subsequently saw oncology, currently receiving treatment with octreotide monthly injections, first shot 03/27/2022, latest 04/18/2022.   She is now back on eliquis 2.5mg  bid. No blood in stool, black tarry  stools.  Since home cough has actually significantly improved.  She stays short winded.  Some dizziness with standing.   We recently faxed order for portable oxygen concentrator to [facility]. This is still pending.   Needs to schedule CHF clinic f/u ([facility]).  No GI f/u appt.      Relevant past medical, surgical, family and social history reviewed and updated as indicated. Interim medical history since our last visit reviewed. Allergies and medications reviewed and updated. Outpatient Medications Prior to Visit  Medication Sig Dispense Refill   acetaminophen (TYLENOL) 500 MG tablet Take 500-1,000 mg by mouth every 8 (eight) hours as needed for mild pain or headache.     albuterol (VENTOLIN HFA) 108 (90 Base) MCG/ACT inhaler Inhale 2 puffs into the lungs every 6 (six) hours as needed for wheezing or shortness of breath. 8 g 2   apixaban (ELIQUIS) 2.5 MG TABS tablet Take 1 tablet (2.5 mg total) by mouth 2 (two) times daily. 60 tablet 0   clobetasol cream (TEMOVATE) 0.05 % Apply to affected area left foot twice a day until improved. Avoid face, groin, axilla. 30 g 0   Cyanocobalamin (B-12 PO) Take 1 tablet by mouth daily.     fluticasone (FLONASE) 50 MCG/ACT nasal spray Place 2 sprays into both nostrils daily as needed for allergies or rhinitis. 16 g 6   ondansetron (ZOFRAN) 4 MG tablet Take 1 tablet (4 mg total) by mouth every 8 (eight) hours as needed for nausea or vomiting. 20 tablet 0   pantoprazole (PROTONIX) 40 MG tablet Take 1 tablet (40 mg total)  by mouth 2 (two) times daily. 60 tablet 0   potassium chloride SA (KLOR-CON M) 20 MEQ tablet Take 1 tablet (20 mEq total) by mouth daily. 30 tablet 0   vitamin C (ASCORBIC ACID) 250 MG tablet Take 500 mg by mouth daily.     VITAMIN D PO Take 4,000 Units by mouth daily.     torsemide (DEMADEX) 20 MG tablet Take 3 tablets (60 mg total) by mouth daily. 60 tablet 0   No facility-administered medications prior to visit.     Per HPI  unless specifically indicated in ROS section below Review of Systems  Objective:  BP 122/60   Pulse 64   Temp 97.9 F (36.6 C) (Temporal)   Ht 5' 6.5" (1.689 m)   SpO2 100% Comment: 2 L  BMI 21.97 kg/m   Wt Readings from Last 3 Encounters:  04/18/22 138 lb 3.2 oz (62.7 kg)  03/20/22 142 lb 6.4 oz (64.6 kg)  03/04/22 141 lb (64 kg)      Physical Exam Vitals and nursing note reviewed.  Constitutional:      Appearance: Normal appearance. She is not ill-appearing.     Comments: In wheelchair  HENT:     Head: Normocephalic and atraumatic.  Cardiovascular:     Rate and Rhythm: Normal rate and regular rhythm.     Heart sounds: Murmur (3/6 systolic murmur) heard.  Pulmonary:     Effort: Pulmonary effort is normal. No respiratory distress.     Breath sounds: Normal breath sounds. No wheezing, rhonchi or rales.  Musculoskeletal:     Right lower leg: No edema.     Left lower leg: No edema.  Skin:    General: Skin is warm and dry.     Findings: No rash.  Neurological:     Mental Status: She is alert.  Psychiatric:        Mood and Affect: Mood normal.        Behavior: Behavior normal.       Results for orders placed or performed in visit on 04/18/22  Chromogranin A  Result Value Ref Range   Chromogranin A (ng/mL) 59.4 0.0 - 101.8 ng/mL  CMP (Cancer Center only)  Result Value Ref Range   Sodium 139 135 - 145 mmol/L   Potassium 4.2 3.5 - 5.1 mmol/L   Chloride 98 98 - 111 mmol/L   CO2 34 (H) 22 - 32 mmol/L   Glucose, Bld 100 (H) 70 - 99 mg/dL   BUN 29 (H) 8 - 23 mg/dL   Creatinine 8.11 (H) 9.14 - 1.00 mg/dL   Calcium 9.7 8.9 - 78.2 mg/dL   Total Protein 7.4 6.5 - 8.1 g/dL   Albumin 4.3 3.5 - 5.0 g/dL   AST 13 (L) 15 - 41 U/L   ALT 6 0 - 44 U/L   Alkaline Phosphatase 48 38 - 126 U/L   Total Bilirubin 0.6 0.3 - 1.2 mg/dL   GFR, Estimated 39 (L) >60 mL/min   Anion gap 7 5 - 15  CBC with Differential (Cancer Center Only)  Result Value Ref Range   WBC Count 3.4 (L) 4.0  - 10.5 K/uL   RBC 3.37 (L) 3.87 - 5.11 MIL/uL   Hemoglobin 9.9 (L) 12.0 - 15.0 g/dL   HCT 95.6 (L) 21.3 - 08.6 %   MCV 95.3 80.0 - 100.0 fL   MCH 29.4 26.0 - 34.0 pg   MCHC 30.8 30.0 - 36.0 g/dL   RDW 57.8 46.9 -  15.5 %   Platelet Count 193 150 - 400 K/uL   nRBC 0.0 0.0 - 0.2 %   Neutrophils Relative % 74 %   Neutro Abs 2.5 1.7 - 7.7 K/uL   Lymphocytes Relative 15 %   Lymphs Abs 0.5 (L) 0.7 - 4.0 K/uL   Monocytes Relative 10 %   Monocytes Absolute 0.4 0.1 - 1.0 K/uL   Eosinophils Relative 0 %   Eosinophils Absolute 0.0 0.0 - 0.5 K/uL   Basophils Relative 1 %   Basophils Absolute 0.0 0.0 - 0.1 K/uL   Immature Granulocytes 0 %   Abs Immature Granulocytes 0.01 0.00 - 0.07 K/uL   *Note: Due to a large number of results and/or encounters for the requested time period, some results have not been displayed. A complete set of results can be found in Results Review.   Lab Results  Component Value Date   TSH 1.835 02/13/2022    Lab Results  Component Value Date   IRON 52 03/20/2022   TIBC 216 (L) 03/20/2022   FERRITIN 738 (H) 03/20/2022   Assessment & Plan:   Problem List Items Addressed This Visit     History of blood transfusion reaction (Chronic)   Mixed hyperlipidemia    Looks like zetia and crestor fell off med list - will need to verify she continues this      Permanent atrial fibrillation    Now back on low dose eliquis.  Recent Hgb stable.       Chronic kidney disease, stage 3b (HCC)    Latest GFR 39, she continues BID pantoprazole - will continue to monitor.       ICD (implantable cardioverter-defibrillator) in place   Chronic diastolic CHF (congestive heart failure) (HCC)    Chronic, seems euvolemic, on torsemide and potassium      Chronic fatigue   History of nephrectomy secondary to RCC   Anemia   Hypothyroidism    Looks like synthroid fell off med list - will need to verify she continues this      AVM (arteriovenous malformation) of small bowel,  acquired with hemorrhage    H/o, s/p cauterization while hospitalized. Eliquis initially held, now back on this.       Neuroendocrine tumor of pancreas - Primary    New diagnosis this year, now followed by oncology.  She is being treated with monthly Octreotide injections.  Appreciate oncology care.       Respiratory failure (HCC)    Now on oxygen 2L via [place].  Requested portable oxygen concentrator - pending in-home testing by DME company.         No orders of the defined types were placed in this encounter.  No orders of the defined types were placed in this encounter.    Patient Instructions  We will refer you back to Dr Alford Highland office.  Continue current medicines including torsemide 60mg  daily and pantoprazole twice daily.  Return in 3 months for follow up visit.   Follow up plan: Return in about 3 months (around 07/24/2022), or if symptoms worsen or fail to improve, for follow up visit.  [name], MD

## 2022-04-24 NOTE — Patient Instructions (Addendum)
We will refer you back to Dr Claris Gladden office.  Continue current medicines including torsemide '60mg'$  daily and pantoprazole twice daily.  Return in 3 months for follow up visit.

## 2022-04-26 ENCOUNTER — Other Ambulatory Visit (HOSPITAL_COMMUNITY): Payer: Self-pay | Admitting: Family Medicine

## 2022-04-27 NOTE — Assessment & Plan Note (Signed)
Now back on low dose eliquis.  Recent Hgb stable.

## 2022-04-27 NOTE — Assessment & Plan Note (Addendum)
Now followed by oncology. She is being treated with monthly Octreotide injections.  Appreciate oncology care.

## 2022-04-29 ENCOUNTER — Telehealth: Payer: Self-pay | Admitting: Family Medicine

## 2022-04-29 ENCOUNTER — Encounter: Payer: Self-pay | Admitting: Oncology

## 2022-04-29 ENCOUNTER — Encounter: Payer: Self-pay | Admitting: Family Medicine

## 2022-04-29 DIAGNOSIS — T8092XA Unspecified transfusion reaction, initial encounter: Secondary | ICD-10-CM | POA: Insufficient documentation

## 2022-04-29 NOTE — Assessment & Plan Note (Signed)
Now on oxygen 2L via [place].  Requested portable oxygen concentrator - pending in-home testing by DME company.

## 2022-04-29 NOTE — Assessment & Plan Note (Addendum)
Looks like zetia and crestor fell off med list - will need to verify she continues this

## 2022-04-29 NOTE — Telephone Encounter (Signed)
Looks like some meds fell of her med list while hospitalized - plz check if she continues these:  1. crestor '40mg'$  daily 2. Zetia '10mg'$  daily 3. Synthroid 68mg daily (Brand)

## 2022-04-29 NOTE — Assessment & Plan Note (Signed)
Looks like synthroid fell off med list - will need to verify she continues this

## 2022-04-29 NOTE — Assessment & Plan Note (Signed)
H/o, s/p cauterization while hospitalized. Eliquis initially held, now back on this.

## 2022-04-29 NOTE — Assessment & Plan Note (Signed)
Chronic, seems euvolemic, on torsemide and potassium

## 2022-04-29 NOTE — Assessment & Plan Note (Signed)
Latest GFR 39, she continues BID pantoprazole - will continue to monitor.

## 2022-04-30 NOTE — Telephone Encounter (Signed)
Spoke with pt asking if she is or is not taking meds Dr. Darnell Level asked about. Pt confirms she is not taking Crestor, Zetia nor Synthroid because they told her not to when she was in the hospital. Pt is asking if she should restart meds. Plz advise.

## 2022-05-02 MED ORDER — ROSUVASTATIN CALCIUM 40 MG PO TABS: 40.0000 mg | ORAL_TABLET | Freq: Every day | ORAL | 11 refills | Status: DC

## 2022-05-02 NOTE — Telephone Encounter (Signed)
Spoke with pt relaying Dr. Synthia Innocent message. Pt states she didn't have any problem with Crestor. I relayed Dr. Synthia Innocent message. Pt verbalizes understanding and agrees to restart Crestor but will wait to restart other meds after labs.

## 2022-05-02 NOTE — Addendum Note (Signed)
Addended by: Ria Bush on: 05/02/2022 10:08 AM   Modules accepted: Orders

## 2022-05-02 NOTE — Telephone Encounter (Addendum)
Did she have any trouble tolerating crestor? If not, with PAD hx would recommend restart at least crestor '40mg'$  daily. We can check lipids next labwork and determine need for zetia.  If she'd like we can hold Synthroid at this time and can recheck thyroid levels next labwork as well to see need for thyroid replacement.

## 2022-05-07 ENCOUNTER — Telehealth: Payer: Self-pay | Admitting: Nurse Practitioner

## 2022-05-07 NOTE — Telephone Encounter (Signed)
Patient aware of appointment change  

## 2022-05-13 ENCOUNTER — Telehealth: Payer: Self-pay | Admitting: Family Medicine

## 2022-05-13 NOTE — Telephone Encounter (Signed)
Pt called wants to know status of prescription portable oxygen  . Please advise # [phone]

## 2022-05-14 ENCOUNTER — Telehealth: Payer: Self-pay | Admitting: Nurse Practitioner

## 2022-05-14 NOTE — Telephone Encounter (Signed)
Spoke with pt notifying her the order and info was faxed to [facility] (see 04/03/22 phn note) and they should be reaching out to her to get things set up. Pt expresses her thanks.

## 2022-05-14 NOTE — Telephone Encounter (Signed)
Patient called to r/s upcoming 1/10 appointment due to illness. Unsure if it is COVID. R/s appointments and patient notified.

## 2022-05-15 ENCOUNTER — Encounter: Payer: Self-pay | Admitting: Internal Medicine

## 2022-05-15 ENCOUNTER — Inpatient Hospital Stay: Payer: PPO | Admitting: Nurse Practitioner

## 2022-05-15 ENCOUNTER — Ambulatory Visit: Payer: PPO | Admitting: Family Medicine

## 2022-05-15 ENCOUNTER — Inpatient Hospital Stay: Payer: PPO

## 2022-05-15 ENCOUNTER — Ambulatory Visit (INDEPENDENT_AMBULATORY_CARE_PROVIDER_SITE_OTHER): Payer: PPO | Admitting: Internal Medicine

## 2022-05-15 VITALS — BP 120/66 | HR 60 | Temp 97.8°F | Ht 66.5 in | Wt 138.0 lb

## 2022-05-15 DIAGNOSIS — J0110 Acute frontal sinusitis, unspecified: Secondary | ICD-10-CM

## 2022-05-15 MED ORDER — AMOXICILLIN-POT CLAVULANATE 600-42.9 MG/5ML PO SUSR: 900.0000 mg | Freq: Two times a day (BID) | ORAL | 0 refills | Status: DC

## 2022-05-15 NOTE — Progress Notes (Signed)
Subjective:    Patient ID: [name], female    DOB: [birth year], 89 y.o.   MRN: [id number]  HPI Here due to respiratory infection With niece  Now having bad right ear pain--2 days Started 4 days ago--with mild throat symptoms Lots of drainage---not unusual with oxygen Some cough--- slight clear mucus No fever---but taking tylenol No chills, sweats, myalgias COVID test negative No change in SOB Right frontal headache  No other meds for this  Current Outpatient Medications on File Prior to Visit  Medication Sig Dispense Refill   acetaminophen (TYLENOL) 500 MG tablet Take 500-1,000 mg by mouth every 8 (eight) hours as needed for mild pain or headache.     albuterol (VENTOLIN HFA) 108 (90 Base) MCG/ACT inhaler Inhale 2 puffs into the lungs every 6 (six) hours as needed for wheezing or shortness of breath. 8 g 2   apixaban (ELIQUIS) 2.5 MG TABS tablet Take 1 tablet (2.5 mg total) by mouth 2 (two) times daily. 60 tablet 0   clobetasol cream (TEMOVATE) 0.05 % Apply to affected area left foot twice a day until improved. Avoid face, groin, axilla. 30 g 0   Cyanocobalamin (B-12 PO) Take 1 tablet by mouth daily.     fluticasone (FLONASE) 50 MCG/ACT nasal spray Place 2 sprays into both nostrils daily as needed for allergies or rhinitis. 16 g 6   ondansetron (ZOFRAN) 4 MG tablet Take 1 tablet (4 mg total) by mouth every 8 (eight) hours as needed for nausea or vomiting. 20 tablet 0   pantoprazole (PROTONIX) 40 MG tablet Take 1 tablet (40 mg total) by mouth 2 (two) times daily. 60 tablet 0   potassium chloride SA (KLOR-CON M) 20 MEQ tablet Take 1 tablet (20 mEq total) by mouth daily. 30 tablet 0   rosuvastatin (CRESTOR) 40 MG tablet Take 1 tablet (40 mg total) by mouth daily. 30 tablet 11   torsemide (DEMADEX) 20 MG tablet TAKE 4 TABLETS ('80MG'$ ) BY MOUTH EVERY EVENING. TO BE TAKEN WITH '100MG'$  TABLET IN THE AM 120 tablet 0   vitamin C (ASCORBIC ACID) 250 MG tablet Take 500 mg by mouth daily.      VITAMIN D PO Take 4,000 Units by mouth daily.     No current facility-administered medications on file prior to visit.    Allergies  Allergen Reactions   Dilaudid [Hydromorphone] Other (See Comments)    Excessive Somnolence- Required Narcan   Fentanyl Other (See Comments)    Required Narcan when given with Dilaudid. Tolerated single dose when given during procedure 02/03/2019.    Codeine Nausea And Vomiting    Hallucinations    Morphine And Related Nausea And Vomiting    Hallucinations    Hydrocodone Nausea Only and Other (See Comments)    Nausea and lethargy   Oxycodone Nausea Only and Other (See Comments)    Nausea and lethargy   Tramadol Nausea Only and Other (See Comments)    Nausea and lethargy   Whole Blood Itching and Nausea And Vomiting    Pt and family stated pt will receive blood products but had a reaction requiring benadryl on a previous administration.  Needs premedication with tylenol, benadryl, steroids for future transfusions    Sulfonamide Derivatives Other (See Comments)    Dry mouth    Past Medical History:  Diagnosis Date   Actinic keratosis 01/17/2015   R forearm   Adjustment disorder with anxiety    Anemia    Anginal pain ([place])  Arthritis    "some in my hands" (11/11/2012)   Atrial fibrillation (HCC)    Atypical mole 03/25/2018   L forearm - severe   Automatic implantable cardioverter-defibrillator in situ    Avascular necrosis of hip ([place]) 05/03/2011   Blood in stool    Carotid artery stenosis 09/2007   a. 09/2007: 60-79% bilateral (stable); b. 10/2008: 40-59% R 60-79%    Cellulitis of left lower extremity 07/13/2020   Chest pain, unspecified    Chronic airway obstruction, not elsewhere classified    Chronic bronchitis ([place])    "get it some; not q year" (11/11/2012)   Chronic diastolic CHF (congestive heart failure) ([place]) 06/04/2013   Chronic kidney disease, unspecified    Coronary artery disease    non-obstructive by 2006 cath   COVID-19  virus infection 02/12/2022   Displaced fracture of left femoral neck ([place]) 10/09/2018   Frequent UTI    "get them a couple times/yr" (11/11/2012)   GI bleed 03/28/2020   Hemorrhage of rectum and anus    High cholesterol    History of blood transfusion 04/2011   "after hip OR" (11/11/2012)   Hx of renal cell cancer    Hypertension 05/20/2011   Hypertr obst cardiomyop    Hypotension, unspecified    cardiac cath 2006..nonobstructive CAD 30-40s lesions.Marland KitchenETT 1/09 nondiagnostic due to poor HR response..Right Renal Cancer 2003   Long term (current) use of anticoagulants    Malignant neoplasm of kidney, except pelvis    Osteoarthritis of right hip    Other and unspecified coagulation defects    PONV (postoperative nausea and vomiting)    Presence of permanent cardiac pacemaker    RECTAL BLEEDING 10/13/2009   Qualifier: Diagnosis of  By: Chester Holstein NP, Paula     Renal cancer [facility]) 06/2001   Right   Secondary cardiomyopathy, unspecified    Sinus bradycardia    Squamous cell carcinoma of skin 01/17/2015   R lat wrist   Squamous cell carcinoma of skin 01/29/2018   R post upper leg - superficially invasive   Squamous cell carcinoma of skin 03/25/2018   L lat foot   Squamous cell carcinoma of skin 11/09/2020   left lat foot - EDC 01/01/21, recurrent 01/31/21 - MOHs 02/22/21   Squamous cell carcinoma of skin 12/19/2021   Right Posterior Medial Thigh, EDC   Squamous cell carcinoma of skin 12/19/2021   SCC IS, L lat heel, EDC 02/04/2022   Squamous cell carcinoma of skin 01/21/2022   L forearm, EDC 02/04/2022   Squamous cell carcinoma of skin 01/21/2022   R lower leg below knee, EDC 02/04/2022   Squamous cell carcinoma of skin 01/21/2022   SCCIS, R post heel, EDC 02/04/2022   Urge incontinence     Past Surgical History:  Procedure Laterality Date   ABDOMINAL AORTOGRAM W/LOWER EXTREMITY N/A 02/12/2021   Procedure: ABDOMINAL AORTOGRAM W/LOWER EXTREMITY;  Surgeon: Waynetta Sandy, MD;   Location: [place] [facility];  Service: Cardiovascular;  Laterality: N/A;   ABDOMINAL HYSTERECTOMY  1975   for benign causes   APPENDECTOMY     BI-VENTRICULAR PACEMAKER UPGRADE  05/04/2010   BIOPSY  02/28/2019   Procedure: BIOPSY;  Surgeon: Milus Banister, MD;  Location: [facility] ENDOSCOPY;  Service: Endoscopy;;   BIOPSY  03/04/2022   Procedure: BIOPSY;  Surgeon: Irving Copas., MD;  Location: [facility];  Service: Gastroenterology;;   CARDIAC CATHETERIZATION  2006   CARDIOVERSION N/A 02/20/2018   Procedure: CARDIOVERSION;  Surgeon: Ida Rogue  J, MD;  Location: [facility] ORS;  Service: Cardiovascular;  Laterality: N/A;   CARDIOVERSION N/A 03/27/2018   Procedure: CARDIOVERSION;  Surgeon: Minna Merritts, MD;  Location: [facility] ORS;  Service: Cardiovascular;  Laterality: N/A;   CATARACT EXTRACTION W/ INTRAOCULAR LENS  IMPLANT, BILATERAL  01/2006-02-2006   CHOLECYSTECTOMY N/A 11/11/2012   Procedure: LAPAROSCOPIC CHOLECYSTECTOMY WITH INTRAOPERATIVE CHOLANGIOGRAM;  Surgeon: Imogene Burn. Georgette Dover, MD;  Location: [place];  Service: General;  Laterality: N/A;   COLONOSCOPY WITH PROPOFOL N/A 02/28/2019   Procedure: COLONOSCOPY WITH PROPOFOL;  Surgeon: Milus Banister, MD;  Location: [facility] ENDOSCOPY;  Service: Endoscopy;  Laterality: N/A;   ENTEROSCOPY N/A 03/30/2020   Procedure: ENTEROSCOPY;  Surgeon: Lavena Bullion, DO;  Location: [place];  Service: Gastroenterology;  Laterality: N/A;   ENTEROSCOPY N/A 02/10/2021   Procedure: ENTEROSCOPY;  Surgeon: Carol Ada, MD;  Location: [place];  Service: Endoscopy;  Laterality: N/A;   ENTEROSCOPY N/A 02/15/2022   Procedure: ENTEROSCOPY;  Surgeon: Sharyn Creamer, MD;  Location: [facility] ENDOSCOPY;  Service: Gastroenterology;  Laterality: N/A;   EP IMPLANTABLE DEVICE N/A 02/21/2016   Procedure: ICD Generator Changeout;  Surgeon: Deboraha Sprang, MD;  Location: [place] [facility];  Service: Cardiovascular;  Laterality: N/A;   ESOPHAGOGASTRODUODENOSCOPY N/A  03/04/2022   Procedure: ESOPHAGOGASTRODUODENOSCOPY (EGD);  Surgeon: Irving Copas., MD;  Location: [facility];  Service: Gastroenterology;  Laterality: N/A;   ESOPHAGOGASTRODUODENOSCOPY (EGD) WITH PROPOFOL N/A 02/28/2019   Procedure: ESOPHAGOGASTRODUODENOSCOPY (EGD) WITH PROPOFOL;  Surgeon: Milus Banister, MD;  Location: [facility] ENDOSCOPY;  Service: Endoscopy;  Laterality: N/A;   EUS N/A 03/04/2022   Procedure: UPPER ENDOSCOPIC ULTRASOUND (EUS) RADIAL;  Surgeon: Irving Copas., MD;  Location: [facility] ENDOSCOPY;  Service: Gastroenterology;  Laterality: N/A;   FINE NEEDLE ASPIRATION N/A 03/04/2022   Procedure: FINE NEEDLE ASPIRATION (FNA) LINEAR;  Surgeon: Irving Copas., MD;  Location: [facility] ENDOSCOPY;  Service: Gastroenterology;  Laterality: N/A;   GIVENS CAPSULE STUDY N/A 03/15/2019   Procedure: GIVENS CAPSULE STUDY;  Surgeon: Thornton Park, MD;  Location: [place];  Service: Gastroenterology;  Laterality: N/A;   HOT HEMOSTASIS N/A 03/30/2020   Procedure: HOT HEMOSTASIS (ARGON PLASMA COAGULATION/BICAP);  Surgeon: Lavena Bullion, DO;  Location: [facility] ENDOSCOPY;  Service: Gastroenterology;  Laterality: N/A;   HOT HEMOSTASIS N/A 02/10/2021   Procedure: HOT HEMOSTASIS (ARGON PLASMA COAGULATION/BICAP);  Surgeon: Carol Ada, MD;  Location: [place];  Service: Endoscopy;  Laterality: N/A;   HOT HEMOSTASIS N/A 02/15/2022   Procedure: HOT HEMOSTASIS (ARGON PLASMA COAGULATION/BICAP);  Surgeon: Sharyn Creamer, MD;  Location: [place];  Service: Gastroenterology;  Laterality: N/A;   INSERT / REPLACE / REMOVE PACEMAKER  05-01-11   02-28-05-/05-04-10-ICD-MEDTRONIC MAXIMAL DR   IR ANGIOGRAM SELECTIVE EACH ADDITIONAL VESSEL  02/18/2022   IR ANGIOGRAM SELECTIVE EACH ADDITIONAL VESSEL  02/18/2022   IR ANGIOGRAM VISCERAL SELECTIVE  02/16/2022   IR ANGIOGRAM VISCERAL SELECTIVE  02/16/2022   IR EMBO ART  VEN HEMORR LYMPH EXTRAV  INC GUIDE ROADMAPPING  02/16/2022   IR  THORACENTESIS ASP PLEURAL SPACE W/IMG GUIDE  02/21/2022   IR THORACENTESIS ASP PLEURAL SPACE W/IMG GUIDE  02/22/2022   IR US GUIDE VASC ACCESS RIGHT  02/16/2022   JOINT REPLACEMENT     LAPAROSCOPIC CHOLECYSTECTOMY  11/11/2012   LAPAROSCOPIC LYSIS OF ADHESIONS N/A 11/11/2012   Procedure: LAPAROSCOPIC LYSIS OF ADHESIONS;  Surgeon: Imogene Burn. Georgette Dover, MD;  Location: [place];  Service: General;  Laterality: N/A;   NEPHRECTOMY Right 06/2001    S/P RENAL  CELL CANCER   PERIPHERAL VASCULAR INTERVENTION Bilateral 02/12/2021   Procedure: PERIPHERAL VASCULAR INTERVENTION;  Surgeon: Waynetta Sandy, MD;  Location: [place] [facility];  Service: Cardiovascular;  Laterality: Bilateral;  Iliac artery stents   PRESSURE SENSOR/CARDIOMEMS N/A 02/03/2019   Procedure: PRESSURE SENSOR/CARDIOMEMS;  Surgeon: Larey Dresser, MD;  Location: [place] [facility];  Service: Cardiovascular;  Laterality: N/A;   RIGHT HEART CATH N/A 11/09/2018   Procedure: RIGHT HEART CATH;  Surgeon: Larey Dresser, MD;  Location: [place] [facility];  Service: Cardiovascular;  Laterality: N/A;   RIGHT HEART CATH N/A 03/08/2019   Procedure: RIGHT HEART CATH;  Surgeon: Larey Dresser, MD;  Location: [place] [facility];  Service: Cardiovascular;  Laterality: N/A;   SUBMUCOSAL TATTOO INJECTION  02/28/2019   Procedure: SUBMUCOSAL TATTOO INJECTION;  Surgeon: Milus Banister, MD;  Location: [facility] ENDOSCOPY;  Service: Endoscopy;;   TOTAL HIP ARTHROPLASTY Right 05/03/2011   Procedure: TOTAL HIP ARTHROPLASTY ANTERIOR APPROACH;  Surgeon: Mcarthur Rossetti;  Location: [facility]S;  Service: Orthopedics;  Laterality: Right;  Removal of Cannulated Screws Right Hip, Right Direct Anterior Hip Replacement   TOTAL HIP ARTHROPLASTY Left 10/09/2018   Procedure: TOTAL HIP ARTHROPLASTY ANTERIOR APPROACH;  Surgeon: Rod Can, MD;  Location: [place];  Service: Orthopedics;  Laterality: Left;    Family History  Problem Relation Age of Onset   Heart failure  Mother    Early death Father        car accident   Breast cancer Maternal Aunt 82   Breast cancer Cousin    Breast cancer Other    Colon cancer Neg Hx    Esophageal cancer Neg Hx    Liver disease Neg Hx    Pancreatic cancer Neg Hx    Rectal cancer Neg Hx     Social History   Socioeconomic History   Marital status: Widowed    Spouse name: Not on file   Number of children: 2   Years of education: high school   Highest education level: Not on file  Occupational History   Occupation: Retired    Fish farm manager: RETIRED  Tobacco Use   Smoking status: Former    Packs/day: 0.50    Years: 40.00    Total pack years: 20.00    Types: Cigarettes    Quit date: 05/06/2001    Years since quitting: 21.0   Smokeless tobacco: Never  Vaping Use   Vaping Use: Never used  Substance and Sexual Activity   Alcohol use: No    Alcohol/week: 0.0 standard drinks of alcohol   Drug use: No   Sexual activity: Not Currently  Other Topics Concern   Not on file  Social History Narrative   Would desire CPR   07/11/20   From: the area   Living: with great-grandson - [name] (2003)   Work: retired - clerical      Family: 2 children - [name] and [place] - 2 grand chlidren - 5 great grand children      Enjoys: spending time with friends - watch move, eat out, spend time      Exercise: walking around the house   Diet: not good, limits red meat, limits salt, low appetite      Safety   Seat belts: Yes    Guns: Yes  and secure   Safe in relationships: Yes    Social Determinants of Health   Financial Resource Strain: Low Risk  (09/24/2021)   Overall Financial Resource  Strain (CARDIA)    Difficulty of Paying Living Expenses: Not hard at all  Food Insecurity: No Food Insecurity (02/14/2022)   Hunger Vital Sign    Worried About Running Out of Food in the Last Year: Never true    Ran Out of Food in the Last Year: Never true  Transportation Needs: No Transportation Needs (02/14/2022)   PRAPARE -  Hydrologist (Medical): No    Lack of Transportation (Non-Medical): No  Physical Activity: Inactive (09/24/2021)   Exercise Vital Sign    Days of Exercise per Week: 0 days    Minutes of Exercise per Session: 0 min  Stress: No Stress Concern Present (09/24/2021)   [place]    Feeling of Stress : Not at all  Social Connections: Not on file  Intimate Partner Violence: Not At Risk (02/14/2022)   Humiliation, Afraid, Rape, and Kick questionnaire    Fear of Current or Ex-Partner: No    Emotionally Abused: No    Physically Abused: No    Sexually Abused: No   Review of Systems No N/V No change in smell or taste Eating okay--appetite down some    Objective:   Physical Exam Constitutional:      Appearance: Normal appearance.  HENT:     Head:     Comments: No sinus tenderness    Ears:     Comments: Cerumen blocking canals    Mouth/Throat:     Pharynx: No oropharyngeal exudate or posterior oropharyngeal erythema.  Pulmonary:     Effort: Pulmonary effort is normal.     Breath sounds: No wheezing or rales.     Comments: Decreased breath sounds but clear Neurological:     Mental Status: She is alert.            Assessment & Plan:

## 2022-05-15 NOTE — Assessment & Plan Note (Signed)
Has bad ear pain--but I can't see there. Either pressure or infection Ongoing head/sinus symptoms so will treat with augmentin x 1 week Continue tylenol

## 2022-05-16 ENCOUNTER — Inpatient Hospital Stay: Payer: PPO

## 2022-05-16 ENCOUNTER — Inpatient Hospital Stay: Payer: PPO | Admitting: Nurse Practitioner

## 2022-05-19 DIAGNOSIS — J90 Pleural effusion, not elsewhere classified: Secondary | ICD-10-CM | POA: Diagnosis not present

## 2022-05-19 DIAGNOSIS — M6281 Muscle weakness (generalized): Secondary | ICD-10-CM | POA: Diagnosis not present

## 2022-05-19 DIAGNOSIS — J449 Chronic obstructive pulmonary disease, unspecified: Secondary | ICD-10-CM | POA: Diagnosis not present

## 2022-05-19 DIAGNOSIS — I5033 Acute on chronic diastolic (congestive) heart failure: Secondary | ICD-10-CM | POA: Diagnosis not present

## 2022-05-19 DIAGNOSIS — Z4789 Encounter for other orthopedic aftercare: Secondary | ICD-10-CM | POA: Diagnosis not present

## 2022-05-19 DIAGNOSIS — R2689 Other abnormalities of gait and mobility: Secondary | ICD-10-CM | POA: Diagnosis not present

## 2022-05-20 ENCOUNTER — Ambulatory Visit (INDEPENDENT_AMBULATORY_CARE_PROVIDER_SITE_OTHER): Payer: PPO | Admitting: Nurse Practitioner

## 2022-05-20 ENCOUNTER — Encounter: Payer: Self-pay | Admitting: Nurse Practitioner

## 2022-05-20 VITALS — BP 112/58 | HR 60 | Wt 138.0 lb

## 2022-05-20 DIAGNOSIS — R42 Dizziness and giddiness: Secondary | ICD-10-CM | POA: Diagnosis not present

## 2022-05-20 DIAGNOSIS — H9201 Otalgia, right ear: Secondary | ICD-10-CM

## 2022-05-20 DIAGNOSIS — H6500 Acute serous otitis media, unspecified ear: Secondary | ICD-10-CM | POA: Diagnosis not present

## 2022-05-20 DIAGNOSIS — H6123 Impacted cerumen, bilateral: Secondary | ICD-10-CM | POA: Insufficient documentation

## 2022-05-20 LAB — CBC
HCT: 30.4 % — ABNORMAL LOW (ref 36.0–46.0)
Hemoglobin: 9.8 g/dL — ABNORMAL LOW (ref 12.0–15.0)
MCHC: 32 g/dL (ref 30.0–36.0)
MCV: 88.7 fl (ref 78.0–100.0)
Platelets: 208 10*3/uL (ref 150.0–400.0)
RBC: 3.43 Mil/uL — ABNORMAL LOW (ref 3.87–5.11)
RDW: 16.7 % — ABNORMAL HIGH (ref 11.5–15.5)
WBC: 3.6 10*3/uL — ABNORMAL LOW (ref 4.0–10.5)

## 2022-05-20 MED ORDER — CEFDINIR 250 MG/5ML PO SUSR: 300.0000 mg | Freq: Two times a day (BID) | ORAL | 0 refills | Status: DC

## 2022-05-20 NOTE — Progress Notes (Signed)
Acute Office Visit  Subjective:     Patient ID: [name], female    DOB: [birth year], 89 y.o.   MRN: [id number]  Chief Complaint  Patient presents with   Ear Pain    Right   Fatigue   Cough    Intermittent, has improved   Dizziness     Patient is in today for sick symptoms with a history of TIA, atrial fibrillation, HTN, COPD, neuroendocrine tumor of the pancreas, CKD  Symptoms started on Saturday a week ago  Covid vaccine: pfizer x2 Flu vaccine:? Covid test and was negative  States that she has been using tylenol with some relief   Dizziness:states that movement makes it worse and that it can happen with sitting still. States that it is lightheaded  Patient was seen by colleague on 05/15/2022 diagnosed with sinus infection and placed on Augmentin 1 tablet twice daily for a week. She did not check the antibiotic. States that she took 3-4 doses in and had sick to her stomach   Review of Systems  Constitutional:  Positive for malaise/fatigue. Negative for chills and fever.       Appetite is decreased Fluid intake is good   HENT:  Positive for ear pain and sinus pain. Negative for sore throat.   Respiratory:  Positive for cough (improving). Negative for sputum production.        2L all the time   Musculoskeletal:  Negative for joint pain and myalgias.  Neurological:  Positive for dizziness and headaches.        Objective:    BP (!) 112/58   Pulse 60   Wt 138 lb (62.6 kg)   SpO2 99% Comment: on 2L [place]  BMI 21.94 kg/m    Physical Exam Vitals and nursing note reviewed.  Constitutional:      Appearance: Normal appearance.  HENT:     Right Ear: Ear canal and external ear normal. There is impacted cerumen. Tympanic membrane is erythematous.     Left Ear: Ear canal and external ear normal. There is impacted cerumen.     Mouth/Throat:     Mouth: Mucous membranes are moist.     Pharynx: Oropharynx is clear.  Eyes:     Extraocular Movements: Extraocular  movements intact.     Pupils: Pupils are equal, round, and reactive to light.  Cardiovascular:     Rate and Rhythm: Normal rate and regular rhythm.     Pulses: Normal pulses.     Heart sounds: Murmur heard.  Pulmonary:     Effort: Pulmonary effort is normal.     Breath sounds: Normal breath sounds.  Lymphadenopathy:     Cervical: No cervical adenopathy.  Neurological:     General: No focal deficit present.     Mental Status: She is alert.     Comments: Bilateral upper and lower extremity strength 5/5     No results found for any visits on 05/20/22.      Assessment & Plan:   Problem List Items Addressed This Visit       Nervous and Auditory   Bilateral impacted cerumen    Verbal consent obtained.  Patient was prepped per office policy mixture of water and hydroperoxide was used.  Both ears were irrigated.  Right ear impaction was removed left ear infection was not.  Patient tolerated the procedure fairly well she did experience some pain.      Relevant Orders   Ear Lavage   Acute serous  otitis media    Was unable to tolerate the amoxicillin liquid.  Will switch her to cefdinir.  If that fails can do azithromycin just wanted to use caution given age comorbidities and last QTc of 488.      Relevant Medications   cefdinir (OMNICEF) 250 MG/5ML suspension     Other   Lightheadedness    Multifactorial.  Neuroexam intact today.  Will check basic blood work including CBC and CMP patient states she is staying well-hydrated.      Relevant Orders   CBC   Comprehensive metabolic panel   Right ear pain - Primary    Continue over-the-counter analgesics such as Tylenol as needed       Meds ordered this encounter  Medications   cefdinir (OMNICEF) 250 MG/5ML suspension    Sig: Take 6 mLs (300 mg total) by mouth 2 (two) times daily.    Dispense:  120 mL    Refill:  0    Order Specific Question:   Supervising Provider    Answer:   Loura Pardon A [[id number]]    Return if  symptoms worsen or fail to improve.  Romilda Garret, NP

## 2022-05-20 NOTE — Patient Instructions (Addendum)
Nice to see you today I will be in touch with the labs once I have the results You can get over the counter Debrox ( or store brand) ear wax softening drops. Use it as directed approx 3 days before you are seen in office to try and get that ear wax out Follow up if you do not improve

## 2022-05-20 NOTE — Assessment & Plan Note (Signed)
Continue over-the-counter analgesics such as Tylenol as needed

## 2022-05-20 NOTE — Assessment & Plan Note (Signed)
Verbal consent obtained.  Patient was prepped per office policy mixture of water and hydroperoxide was used.  Both ears were irrigated.  Right ear impaction was removed left ear infection was not.  Patient tolerated the procedure fairly well she did experience some pain.

## 2022-05-20 NOTE — Assessment & Plan Note (Signed)
Was unable to tolerate the amoxicillin liquid.  Will switch her to cefdinir.  If that fails can do azithromycin just wanted to use caution given age comorbidities and last QTc of 488.

## 2022-05-20 NOTE — Assessment & Plan Note (Signed)
Multifactorial.  Neuroexam intact today.  Will check basic blood work including CBC and CMP patient states she is staying well-hydrated.

## 2022-05-21 LAB — COMPREHENSIVE METABOLIC PANEL
ALT: 4 U/L (ref 0–35)
AST: 12 U/L (ref 0–37)
Albumin: 4.2 g/dL (ref 3.5–5.2)
Alkaline Phosphatase: 53 U/L (ref 39–117)
BUN: 28 mg/dL — ABNORMAL HIGH (ref 6–23)
CO2: 32 mEq/L (ref 19–32)
Calcium: 9.2 mg/dL (ref 8.4–10.5)
Chloride: 97 mEq/L (ref 96–112)
Creatinine, Ser: 1.39 mg/dL — ABNORMAL HIGH (ref 0.40–1.20)
GFR: 34.44 mL/min — ABNORMAL LOW (ref >60.00)
Glucose, Bld: 75 mg/dL (ref 70–99)
Potassium: 4.3 mEq/L (ref 3.5–5.1)
Sodium: 137 mEq/L (ref 135–145)
Total Bilirubin: 0.4 mg/dL (ref 0.2–1.2)
Total Protein: 7.3 g/dL (ref 6.0–8.3)

## 2022-05-23 ENCOUNTER — Other Ambulatory Visit: Payer: Self-pay | Admitting: Family Medicine

## 2022-05-23 DIAGNOSIS — N1832 Chronic kidney disease, stage 3b: Secondary | ICD-10-CM

## 2022-05-23 DIAGNOSIS — I5032 Chronic diastolic (congestive) heart failure: Secondary | ICD-10-CM

## 2022-05-23 DIAGNOSIS — D3A8 Other benign neuroendocrine tumors: Secondary | ICD-10-CM

## 2022-05-23 DIAGNOSIS — K5521 Angiodysplasia of colon with hemorrhage: Secondary | ICD-10-CM

## 2022-05-23 DIAGNOSIS — J439 Emphysema, unspecified: Secondary | ICD-10-CM

## 2022-05-23 NOTE — Progress Notes (Signed)
Pharmacy care management referral placed

## 2022-05-24 ENCOUNTER — Telehealth: Payer: Self-pay

## 2022-05-24 NOTE — Progress Notes (Signed)
Care Management & Coordination Services Pharmacy Team  Reason for Encounter: Appointment Reminder  Contacted patient on 05/24/2022   Recent office visits:  04/24/22 Ria Bush, MD Neuroendocrine tumor of pancreas F/U 3 months 02/12/22 Ria Bush, MD Covid Positive Abnormal Labs "Your iron levels are again low, your hemoglobin has dropped from 11 to 8.9. Reach out to GI about your worsening anemia. If feeling worse, or ongoing black stools, go straight to the ER"  Stop: Molnupiravir 800 mg per patient preference   Recent consult visits:  05/20/22 Karl Ito, NP Right ear pain Start: Cefdinir 300 mg Stop (side effects): Amoxicillin Pot Clavulanate 600-42.9 mg 05/15/22 Viviana Simpler, MD Acute non-recurrent frontal sinusitis Start: amoxicillin-clavulanate (AUGMENTIN) 600-42.9 MG/5ML suspension 04/18/22 Administered: ferumoxytol (FERAHEME) 510 mg in sodium chloride 0.9 % 100 mL IVPB 04/18/22 Cira Rue, NP (Oncology) Neuroendocrine tumor of pancreas  Administered: octreotide 20 mg IM Injection. 03/27/22 Administered: ferumoxytol (FERAHEME) 510 mg in sodium chloride 0.9 % 100 mL IVPB 03/20/22 Cira Rue, NP (Oncology) Neuroendocrine tumor of pancreas  Administered: octreotide 20 mg IM Injection. 03/04/22 Endoscopy 02/08/22 Waunita Schooner, MD Covid-19 Start: albuterol (VENTOLIN HFA) 108 (90 Base) MCG/ACT inhaler Start: molnupiravir EUA (LAGEVRIO) 200 mg CAPS capsule Start: predniSONE (DELTASONE) 20 MG tablet.  02/04/22  Brendolyn Patty, MD (Derm) Squamous cell carcinoma of skin Start: doxycycline (ADOXA) 100 MG tablet  Start: mupirocin ointment (BACTROBAN) 2 % Destruction of lesions  01/22/22 Brendolyn Patty, MD (Derm) Neoplasm of uncertain behavior of skin Biopsy of lesions 01/09/22 Administered: ferumoxytol [facility]) 510 mg in sodium chloride 0.9 % 100 mL IVPB 01/02/22 Administered: ferumoxytol (FERAHEME) 510 mg in sodium chloride 0.9 % 100 mL IVPB 12/26/21 Vito Cirigliano, DO [facility])  AVM Start: Octreotide Acetate 10 mg Schedule IV iron treatment 12/20/21 Waunita Schooner, MD HTN Abnormal Labs "Your kidney function is a little worse than the last check. How are you doing off the medication?" Stop: Losartan Potassium 12.5 mg  12/19/21 Brendolyn Patty, MD (Derm) Neoplasm of uncertain behavior of skin Start: Clobetasol Propionate 0.05% Destruction of lesion  Hospital visits:  Medication Reconciliation was completed by comparing discharge summary, patient's EMR and Pharmacy list, and upon discussion with patient.  Admitted to the hospital on 02/13/2022 due to Acute upper gastrointestinal bleeding. Discharge date was 03/01/2022. Discharged from [place]?Medications Started at [facility] Discharge:?? ondansetron (Zofran)  pantoprazole (PROTONIX)  Medication Changes at Hospital Discharge: Apixaban (Eliquis) potassium chloride SA (KLOR-CON M) torsemide (DEMADEX)  Medications Discontinued at Hospital Discharge: metolazone 2.5 MG tablet (ZAROXOLYN) octreotide 10 MG injection (SANDOSTATIN LAR)   Medications that remain the same after Hospital Discharge:??  -All other medications will remain the same.    Medications: Outpatient Encounter Medications as of 05/24/2022  Medication Sig Note   acetaminophen (TYLENOL) 500 MG tablet Take 500-1,000 mg by mouth every 8 (eight) hours as needed for mild pain or headache.    albuterol (VENTOLIN HFA) 108 (90 Base) MCG/ACT inhaler Inhale 2 puffs into the lungs every 6 (six) hours as needed for wheezing or shortness of breath.    apixaban (ELIQUIS) 2.5 MG TABS tablet Take 1 tablet (2.5 mg total) by mouth 2 (two) times daily.    cefdinir (OMNICEF) 250 MG/5ML suspension Take 6 mLs (300 mg total) by mouth 2 (two) times daily.    clobetasol cream (TEMOVATE) 0.05 % Apply to affected area left foot twice a day until improved. Avoid face, groin, axilla.    Cyanocobalamin (B-12 PO) Take 1 tablet by mouth daily.  fluticasone (FLONASE)  50 MCG/ACT nasal spray Place 2 sprays into both nostrils daily as needed for allergies or rhinitis.    ondansetron (ZOFRAN) 4 MG tablet Take 1 tablet (4 mg total) by mouth every 8 (eight) hours as needed for nausea or vomiting.    pantoprazole (PROTONIX) 40 MG tablet Take 1 tablet (40 mg total) by mouth 2 (two) times daily.    potassium chloride SA (KLOR-CON M) 20 MEQ tablet Take 1 tablet (20 mEq total) by mouth daily.    rosuvastatin (CRESTOR) 40 MG tablet Take 1 tablet (40 mg total) by mouth daily.    torsemide (DEMADEX) 20 MG tablet TAKE 4 TABLETS ('80MG'$ ) BY MOUTH EVERY EVENING. TO BE TAKEN WITH '100MG'$  TABLET IN THE AM    vitamin C (ASCORBIC ACID) 250 MG tablet Take 500 mg by mouth daily. 02/13/2022: PCP instructed to put on hold until instructed otherwise.    VITAMIN D PO Take 4,000 Units by mouth daily.    No facility-administered encounter medications on file as of 05/24/2022.   Lab Results  Component Value Date/Time   HGBA1C 4.6 (L) 02/09/2021 02:07 AM   HGBA1C 6.1 (H) 08/17/2014 05:00 AM    BP Readings from Last 3 Encounters:  05/20/22 (!) 112/58  05/15/22 120/66  04/24/22 122/60     Patient contacted to confirm telephone appointment with Charlene Brooke, PharmD, on 05/28/2022 at 2:00.  Do you have any problems getting your medications? No  What is your top health concern you would like to discuss at your upcoming visit? No concerns at this time  Have you seen any other providers since your last visit with PCP? Yes  Star Rating Drugs:  Medication:  Last Fill: [facility] Supply Rosuvastatin 40 mg 05/02/22 30  Care Gaps: Annual wellness visit in last year? Yes 09/24/2021  Charlene Brooke, PharmD notified  Marijean Niemann, [place] Pharmacy Assistant [phone]

## 2022-05-27 ENCOUNTER — Other Ambulatory Visit: Payer: Self-pay

## 2022-05-27 ENCOUNTER — Encounter: Payer: Self-pay | Admitting: Nurse Practitioner

## 2022-05-27 ENCOUNTER — Inpatient Hospital Stay (HOSPITAL_BASED_OUTPATIENT_CLINIC_OR_DEPARTMENT_OTHER): Payer: PPO | Admitting: Nurse Practitioner

## 2022-05-27 ENCOUNTER — Inpatient Hospital Stay: Payer: PPO

## 2022-05-27 ENCOUNTER — Inpatient Hospital Stay: Payer: PPO | Attending: Nurse Practitioner

## 2022-05-27 VITALS — BP 138/58 | HR 60 | Temp 98.0°F | Resp 16 | Wt 138.3 lb

## 2022-05-27 DIAGNOSIS — Z8719 Personal history of other diseases of the digestive system: Secondary | ICD-10-CM

## 2022-05-27 DIAGNOSIS — D3A8 Other benign neuroendocrine tumors: Secondary | ICD-10-CM

## 2022-05-27 DIAGNOSIS — D649 Anemia, unspecified: Secondary | ICD-10-CM

## 2022-05-27 DIAGNOSIS — D509 Iron deficiency anemia, unspecified: Secondary | ICD-10-CM | POA: Diagnosis not present

## 2022-05-27 LAB — IRON AND IRON BINDING CAPACITY (CC-WL,HP ONLY)
Iron: 32 ug/dL (ref 28–170)
Saturation Ratios: 14 % (ref 10.4–31.8)
TIBC: 228 ug/dL — ABNORMAL LOW (ref 250–450)
UIBC: 196 ug/dL (ref 148–442)

## 2022-05-27 LAB — CBC WITH DIFFERENTIAL (CANCER CENTER ONLY)
Abs Immature Granulocytes: 0.02 10*3/uL (ref 0.00–0.07)
Basophils Absolute: 0 10*3/uL (ref 0.0–0.1)
Basophils Relative: 1 %
Eosinophils Absolute: 0 10*3/uL (ref 0.0–0.5)
Eosinophils Relative: 0 %
HCT: 29.7 % — ABNORMAL LOW (ref 36.0–46.0)
Hemoglobin: 9.2 g/dL — ABNORMAL LOW (ref 12.0–15.0)
Immature Granulocytes: 1 %
Lymphocytes Relative: 12 %
Lymphs Abs: 0.4 10*3/uL — ABNORMAL LOW (ref 0.7–4.0)
MCH: 28 pg (ref 26.0–34.0)
MCHC: 31 g/dL (ref 30.0–36.0)
MCV: 90.3 fL (ref 80.0–100.0)
Monocytes Absolute: 0.4 10*3/uL (ref 0.1–1.0)
Monocytes Relative: 11 %
Neutro Abs: 2.9 10*3/uL (ref 1.7–7.7)
Neutrophils Relative %: 75 %
Platelet Count: 192 10*3/uL (ref 150–400)
RBC: 3.29 MIL/uL — ABNORMAL LOW (ref 3.87–5.11)
RDW: 15.5 % (ref 11.5–15.5)
WBC Count: 3.8 10*3/uL — ABNORMAL LOW (ref 4.0–10.5)
nRBC: 0 % (ref 0.0–0.2)

## 2022-05-27 LAB — CMP (CANCER CENTER ONLY)
ALT: <5 U/L (ref 0–44)
AST: 11 U/L — ABNORMAL LOW (ref 15–41)
Albumin: 4 g/dL (ref 3.5–5.0)
Alkaline Phosphatase: 51 U/L (ref 38–126)
Anion gap: 5 (ref 5–15)
BUN: 36 mg/dL — ABNORMAL HIGH (ref 8–23)
CO2: 36 mmol/L — ABNORMAL HIGH (ref 22–32)
Calcium: 10.2 mg/dL (ref 8.9–10.3)
Chloride: 98 mmol/L (ref 98–111)
Creatinine: 1.52 mg/dL — ABNORMAL HIGH (ref 0.44–1.00)
GFR, Estimated: 33 mL/min — ABNORMAL LOW (ref >60)
Glucose, Bld: 92 mg/dL (ref 70–99)
Potassium: 4.9 mmol/L (ref 3.5–5.1)
Sodium: 139 mmol/L (ref 135–145)
Total Bilirubin: 0.4 mg/dL (ref 0.3–1.2)
Total Protein: 7.2 g/dL (ref 6.5–8.1)

## 2022-05-27 LAB — FERRITIN: Ferritin: 249 ng/mL (ref 11–307)

## 2022-05-27 MED ORDER — OCTREOTIDE ACETATE 20 MG IM KIT
20.0000 mg | PACK | Freq: Once | INTRAMUSCULAR | Status: AC
  Administered 2022-05-27: 20 mg via INTRAMUSCULAR
  Filled 2022-05-27: qty 1

## 2022-05-27 NOTE — Progress Notes (Signed)
Patient Care Team: Ria Bush, MD as PCP - General (Family Medicine) Deboraha Sprang, MD as PCP - Cardiology (Cardiology) Larey Dresser, MD as PCP - Advanced Heart Failure (Cardiology) Minna Merritts, MD as Consulting Physician (Cardiology) Deboraha Sprang, MD as Consulting Physician (Cardiology) [name], Dolores Lory, MD as Consulting Physician (Vascular Surgery) Debbora Dus, [facility] as Pharmacist (Pharmacist) Sindy Guadeloupe, MD as Consulting Physician (Hematology and Oncology) Truitt Merle, MD as Consulting Physician (Oncology)   CHIEF COMPLAINT: Follow up NET of the pancreas  CURRENT THERAPY: First line Sandostatin injection q28 days, starting 03/27/22  INTERVAL HISTORY  Ms. Panzer returns for follow up and treatment as scheduled, accompanied by her niece. Last seen by me 04/18/22 and received second monthly injection. She was recently diagnosed with sinus and ear infections, taking cefdinir. Diarrhea remains under control, occasional, and no worse on antibiotics. Mild LUQ pain below breast (present >1 year) is slightly more prevalent recently. She has noticed dry skin and hair loss. Overall energy level improved after hospital discharge but has stabilized, low. She remains on 2 L O2. She has finally been given an appointment for testing to receive portable tank (1/29). Otherwise no new/specific complaints.   ROS  All other systems reviewed and negative  Past Medical History:  Diagnosis Date   Actinic keratosis 01/17/2015   R forearm   Adjustment disorder with anxiety    Anemia    Anginal pain ([place])    Arthritis    "some in my hands" (11/11/2012)   Atrial fibrillation (HCC)    Atypical mole 03/25/2018   L forearm - severe   Automatic implantable cardioverter-defibrillator in situ    Avascular necrosis of hip ([place]) 05/03/2011   Blood in stool    Carotid artery stenosis 09/2007   a. 09/2007: 60-79% bilateral (stable); b. 10/2008: 40-59% R 60-79%    Cellulitis of left  lower extremity 07/13/2020   Chest pain, unspecified    Chronic airway obstruction, not elsewhere classified    Chronic bronchitis ([place])    "get it some; not q year" (11/11/2012)   Chronic diastolic CHF (congestive heart failure) ([place]) 06/04/2013   Chronic kidney disease, unspecified    Coronary artery disease    non-obstructive by 2006 cath   COVID-19 virus infection 02/12/2022   Displaced fracture of left femoral neck ([place]) 10/09/2018   Frequent UTI    "get them a couple times/yr" (11/11/2012)   GI bleed 03/28/2020   Hemorrhage of rectum and anus    High cholesterol    History of blood transfusion 04/2011   "after hip OR" (11/11/2012)   Hx of renal cell cancer    Hypertension 05/20/2011   Hypertr obst cardiomyop    Hypotension, unspecified    cardiac cath 2006..nonobstructive CAD 30-40s lesions.Marland KitchenETT 1/09 nondiagnostic due to poor HR response..Right Renal Cancer 2003   Long term (current) use of anticoagulants    Malignant neoplasm of kidney, except pelvis    Osteoarthritis of right hip    Other and unspecified coagulation defects    PONV (postoperative nausea and vomiting)    Presence of permanent cardiac pacemaker    RECTAL BLEEDING 10/13/2009   Qualifier: Diagnosis of  By: Chester Holstein NP, Paula     Renal cancer [facility]) 06/2001   Right   Secondary cardiomyopathy, unspecified    Sinus bradycardia    Squamous cell carcinoma of skin 01/17/2015   R lat wrist   Squamous cell carcinoma of skin 01/29/2018   R post  upper leg - superficially invasive   Squamous cell carcinoma of skin 03/25/2018   L lat foot   Squamous cell carcinoma of skin 11/09/2020   left lat foot - EDC 01/01/21, recurrent 01/31/21 - MOHs 02/22/21   Squamous cell carcinoma of skin 12/19/2021   Right Posterior Medial Thigh, EDC   Squamous cell carcinoma of skin 12/19/2021   SCC IS, L lat heel, EDC 02/04/2022   Squamous cell carcinoma of skin 01/21/2022   L forearm, EDC 02/04/2022   Squamous cell carcinoma of skin  01/21/2022   R lower leg below knee, EDC 02/04/2022   Squamous cell carcinoma of skin 01/21/2022   SCCIS, R post heel, EDC 02/04/2022   Urge incontinence      Past Surgical History:  Procedure Laterality Date   ABDOMINAL AORTOGRAM W/LOWER EXTREMITY N/A 02/12/2021   Procedure: ABDOMINAL AORTOGRAM W/LOWER EXTREMITY;  Surgeon: Waynetta Sandy, MD;  Location: [place] CV LAB;  Service: Cardiovascular;  Laterality: N/A;   ABDOMINAL HYSTERECTOMY  1975   for benign causes   APPENDECTOMY     BI-VENTRICULAR PACEMAKER UPGRADE  05/04/2010   BIOPSY  02/28/2019   Procedure: BIOPSY;  Surgeon: Milus Banister, MD;  Location: [facility] ENDOSCOPY;  Service: Endoscopy;;   BIOPSY  03/04/2022   Procedure: BIOPSY;  Surgeon: Irving Copas., MD;  Location: [facility];  Service: Gastroenterology;;   CARDIAC CATHETERIZATION  2006   CARDIOVERSION N/A 02/20/2018   Procedure: CARDIOVERSION;  Surgeon: Minna Merritts, MD;  Location: [place] ORS;  Service: Cardiovascular;  Laterality: N/A;   CARDIOVERSION N/A 03/27/2018   Procedure: CARDIOVERSION;  Surgeon: Minna Merritts, MD;  Location: [facility] ORS;  Service: Cardiovascular;  Laterality: N/A;   CATARACT EXTRACTION W/ INTRAOCULAR LENS  IMPLANT, BILATERAL  01/2006-02-2006   CHOLECYSTECTOMY N/A 11/11/2012   Procedure: LAPAROSCOPIC CHOLECYSTECTOMY WITH INTRAOPERATIVE CHOLANGIOGRAM;  Surgeon: Imogene Burn. Georgette Dover, MD;  Location: [place];  Service: General;  Laterality: N/A;   COLONOSCOPY WITH PROPOFOL N/A 02/28/2019   Procedure: COLONOSCOPY WITH PROPOFOL;  Surgeon: Milus Banister, MD;  Location: [facility] ENDOSCOPY;  Service: Endoscopy;  Laterality: N/A;   ENTEROSCOPY N/A 03/30/2020   Procedure: ENTEROSCOPY;  Surgeon: Lavena Bullion, DO;  Location: [place];  Service: Gastroenterology;  Laterality: N/A;   ENTEROSCOPY N/A 02/10/2021   Procedure: ENTEROSCOPY;  Surgeon: Carol Ada, MD;  Location: [place];  Service: Endoscopy;  Laterality: N/A;    ENTEROSCOPY N/A 02/15/2022   Procedure: ENTEROSCOPY;  Surgeon: Sharyn Creamer, MD;  Location: [facility] ENDOSCOPY;  Service: Gastroenterology;  Laterality: N/A;   EP IMPLANTABLE DEVICE N/A 02/21/2016   Procedure: ICD Generator Changeout;  Surgeon: Deboraha Sprang, MD;  Location: [place] CV LAB;  Service: Cardiovascular;  Laterality: N/A;   ESOPHAGOGASTRODUODENOSCOPY N/A 03/04/2022   Procedure: ESOPHAGOGASTRODUODENOSCOPY (EGD);  Surgeon: Irving Copas., MD;  Location: [facility]OS[facility];  Service: Gastroenterology;  Laterality: N/A;   ESOPHAGOGASTRODUODENOSCOPY (EGD) WITH PROPOFOL N/A 02/28/2019   Procedure: ESOPHAGOGASTRODUODENOSCOPY (EGD) WITH PROPOFOL;  Surgeon: Milus Banister, MD;  Location: [facility] ENDOSCOPY;  Service: Endoscopy;  Laterality: N/A;   EUS N/A 03/04/2022   Procedure: UPPER ENDOSCOPIC ULTRASOUND (EUS) RADIAL;  Surgeon: Irving Copas., MD;  Location: [facility] ENDOSCOPY;  Service: Gastroenterology;  Laterality: N/A;   FINE NEEDLE ASPIRATION N/A 03/04/2022   Procedure: FINE NEEDLE ASPIRATION (FNA) LINEAR;  Surgeon: Irving Copas., MD;  Location: [facility] ENDOSCOPY;  Service: Gastroenterology;  Laterality: N/A;   GIVENS CAPSULE STUDY N/A 03/15/2019   Procedure: GIVENS CAPSULE STUDY;  Surgeon: Tarri Glenn,  Joelene Millin, MD;  Location: [place];  Service: Gastroenterology;  Laterality: N/A;   HOT HEMOSTASIS N/A 03/30/2020   Procedure: HOT HEMOSTASIS (ARGON PLASMA COAGULATION/BICAP);  Surgeon: Lavena Bullion, DO;  Location: [facility] ENDOSCOPY;  Service: Gastroenterology;  Laterality: N/A;   HOT HEMOSTASIS N/A 02/10/2021   Procedure: HOT HEMOSTASIS (ARGON PLASMA COAGULATION/BICAP);  Surgeon: Carol Ada, MD;  Location: [place];  Service: Endoscopy;  Laterality: N/A;   HOT HEMOSTASIS N/A 02/15/2022   Procedure: HOT HEMOSTASIS (ARGON PLASMA COAGULATION/BICAP);  Surgeon: Sharyn Creamer, MD;  Location: [place];  Service: Gastroenterology;  Laterality: N/A;   INSERT / REPLACE / REMOVE  PACEMAKER  05-01-11   02-28-05-/05-04-10-ICD-MEDTRONIC MAXIMAL DR   IR ANGIOGRAM SELECTIVE EACH ADDITIONAL VESSEL  02/18/2022   IR ANGIOGRAM SELECTIVE EACH ADDITIONAL VESSEL  02/18/2022   IR ANGIOGRAM VISCERAL SELECTIVE  02/16/2022   IR ANGIOGRAM VISCERAL SELECTIVE  02/16/2022   IR EMBO ART  VEN HEMORR LYMPH EXTRAV  INC GUIDE ROADMAPPING  02/16/2022   IR THORACENTESIS ASP PLEURAL SPACE W/IMG GUIDE  02/21/2022   IR THORACENTESIS ASP PLEURAL SPACE W/IMG GUIDE  02/22/2022   IR US GUIDE VASC ACCESS RIGHT  02/16/2022   JOINT REPLACEMENT     LAPAROSCOPIC CHOLECYSTECTOMY  11/11/2012   LAPAROSCOPIC LYSIS OF ADHESIONS N/A 11/11/2012   Procedure: LAPAROSCOPIC LYSIS OF ADHESIONS;  Surgeon: Imogene Burn. Georgette Dover, MD;  Location: [place];  Service: General;  Laterality: N/A;   NEPHRECTOMY Right 06/2001    S/P RENAL CELL CANCER   PERIPHERAL VASCULAR INTERVENTION Bilateral 02/12/2021   Procedure: PERIPHERAL VASCULAR INTERVENTION;  Surgeon: Waynetta Sandy, MD;  Location: [place] [facility] LAB;  Service: Cardiovascular;  Laterality: Bilateral;  Iliac artery stents   PRESSURE SENSOR/CARDIOMEMS N/A 02/03/2019   Procedure: PRESSURE SENSOR/CARDIOMEMS;  Surgeon: Larey Dresser, MD;  Location: [place] CV LAB;  Service: Cardiovascular;  Laterality: N/A;   RIGHT HEART CATH N/A 11/09/2018   Procedure: RIGHT HEART CATH;  Surgeon: Larey Dresser, MD;  Location: [place] CV LAB;  Service: Cardiovascular;  Laterality: N/A;   RIGHT HEART CATH N/A 03/08/2019   Procedure: RIGHT HEART CATH;  Surgeon: Larey Dresser, MD;  Location: [place] CV LAB;  Service: Cardiovascular;  Laterality: N/A;   SUBMUCOSAL TATTOO INJECTION  02/28/2019   Procedure: SUBMUCOSAL TATTOO INJECTION;  Surgeon: Milus Banister, MD;  Location: [facility] ENDOSCOPY;  Service: Endoscopy;;   TOTAL HIP ARTHROPLASTY Right 05/03/2011   Procedure: TOTAL HIP ARTHROPLASTY ANTERIOR APPROACH;  Surgeon: Mcarthur Rossetti;  Location: [facility];  Service:  Orthopedics;  Laterality: Right;  Removal of Cannulated Screws Right Hip, Right Direct Anterior Hip Replacement   TOTAL HIP ARTHROPLASTY Left 10/09/2018   Procedure: TOTAL HIP ARTHROPLASTY ANTERIOR APPROACH;  Surgeon: Rod Can, MD;  Location: [place];  Service: Orthopedics;  Laterality: Left;     Outpatient Encounter Medications as of 05/27/2022  Medication Sig Note   acetaminophen (TYLENOL) 500 MG tablet Take 500-1,000 mg by mouth every 8 (eight) hours as needed for mild pain or headache.    albuterol (VENTOLIN HFA) 108 (90 Base) MCG/ACT inhaler Inhale 2 puffs into the lungs every 6 (six) hours as needed for wheezing or shortness of breath.    apixaban (ELIQUIS) 2.5 MG TABS tablet Take 1 tablet (2.5 mg total) by mouth 2 (two) times daily.    cefdinir (OMNICEF) 250 MG/5ML suspension Take 6 mLs (300 mg total) by mouth 2 (two) times daily.    clobetasol cream (TEMOVATE) 0.05 % Apply to affected area  left foot twice a day until improved. Avoid face, groin, axilla.    Cyanocobalamin (B-12 PO) Take 1 tablet by mouth daily.    fluticasone (FLONASE) 50 MCG/ACT nasal spray Place 2 sprays into both nostrils daily as needed for allergies or rhinitis.    ondansetron (ZOFRAN) 4 MG tablet Take 1 tablet (4 mg total) by mouth every 8 (eight) hours as needed for nausea or vomiting.    pantoprazole (PROTONIX) 40 MG tablet Take 1 tablet (40 mg total) by mouth 2 (two) times daily.    potassium chloride SA (KLOR-CON M) 20 MEQ tablet Take 1 tablet (20 mEq total) by mouth daily.    rosuvastatin (CRESTOR) 40 MG tablet Take 1 tablet (40 mg total) by mouth daily.    torsemide (DEMADEX) 20 MG tablet TAKE 4 TABLETS ('80MG'$ ) BY MOUTH EVERY EVENING. TO BE TAKEN WITH '100MG'$  TABLET IN THE AM    vitamin C (ASCORBIC ACID) 250 MG tablet Take 500 mg by mouth daily. 02/13/2022: PCP instructed to put on hold until instructed otherwise.    VITAMIN D PO Take 4,000 Units by mouth daily.    No facility-administered encounter  medications on file as of 05/27/2022.     Today's Vitals   05/27/22 0956  BP: (!) 138/58  Pulse: 60  Resp: 16  Temp: 98 F (36.7 C)  TempSrc: Oral  SpO2: 100%  Weight: 138 lb 4.8 oz (62.7 kg)   Body mass index is 21.99 kg/m.   PHYSICAL EXAM GENERAL:alert, no distress and comfortable SKIN: no rash  EYES: sclera clear LUNGS: Decreased throughout, normal breathing effort HEART: regular rate & rhythm, murmur noted, trace bilateral lower extremity edema ABDOMEN: abdomen soft, non-tender and normal bowel sounds NEURO: alert & oriented x 3 with fluent speech Exam performed in wheelchair  CBC    Component Value Date/Time   WBC 3.8 (L) 05/27/2022 0916   WBC 3.6 (L) 05/20/2022 1247   RBC 3.29 (L) 05/27/2022 0916   HGB 9.2 (L) 05/27/2022 0916   HGB 8.1 (L) 03/22/2019 1415   HCT 29.7 (L) 05/27/2022 0916   HCT 25.9 (L) 03/22/2019 1415   PLT 192 05/27/2022 0916   PLT 226 03/22/2019 1415   MCV 90.3 05/27/2022 0916   MCV 88 03/22/2019 1415   MCH 28.0 05/27/2022 0916   MCHC 31.0 05/27/2022 0916   RDW 15.5 05/27/2022 0916   RDW 19.9 (H) 03/22/2019 1415   LYMPHSABS 0.4 (L) 05/27/2022 0916   LYMPHSABS 0.6 (L) 03/22/2019 1415   MONOABS 0.4 05/27/2022 0916   EOSABS 0.0 05/27/2022 0916   EOSABS 0.2 03/22/2019 1415   BASOSABS 0.0 05/27/2022 0916   BASOSABS 0.0 03/22/2019 1415     CMP     Component Value Date/Time   NA 139 05/27/2022 0916   NA 141 07/14/2019 0000   K 4.9 05/27/2022 0916   CL 98 05/27/2022 0916   CO2 36 (H) 05/27/2022 0916   GLUCOSE 92 05/27/2022 0916   BUN 36 (H) 05/27/2022 0916   BUN 29 (A) 07/14/2019 0000   CREATININE 1.52 (H) 05/27/2022 0916   CREATININE 1.34 (H) 02/12/2022 1655   CALCIUM 10.2 05/27/2022 0916   PROT 7.2 05/27/2022 0916   PROT 7.2 06/09/2014 1317   ALBUMIN 4.0 05/27/2022 0916   ALBUMIN 3.8 06/09/2014 1317   AST 11 (L) 05/27/2022 0916   ALT <5 05/27/2022 0916   ALT 23 06/09/2014 1317   ALKPHOS 51 05/27/2022 0916   ALKPHOS 71  06/09/2014 1317   BILITOT 0.4  05/27/2022 0916   GFRNONAA 33 (L) 05/27/2022 0916   GFRAA 39 (L) 12/23/2019 1458     ASSESSMENT & PLAN:89 year old female with   Neuroendocrine tumor of pancreatic head, 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   -Outpatient EUS 03/04/2022 by Dr. Rush Landmark showed a T3N0 mass in the pancreatic head/uncinate process with 2 satellite lesions in the tail; biopsy confirmed neuroendocrine tumor of the pancreatic head mass -We reviewed her case in GI conference, Dr. Zenia Resides did not recommend surgery in multifocal NET and also due to her age and co-morbidities. -She began first line sandostatin injections q28 days on 03/27/22.  -Dotatate PET scan, done 2 weeks after first injection 12/6, shows no significant tracer avidity in the pancrease or distant sites. Findings are likely due to close timing relative to the sandostatin injection blocking receptors. When we repeat this for restaging, will arrange 4 weeks after sandostatin -Ms. Bost appears stable. Tolerating monthly sandostatin. Diarrhea improved on treatment, now occasional, and chromogranin A normalized. She is likely responding -Labs reviewed, proceed with cycle 3 sandostatin today as planned. Continue q4 weeks -F/up with cycle 4 in 4 weeks   Multifactorial anemia, secondary to GI bleed, h/o IDA and B12 deficiency, and anemia of chronic disease -She has a longstanding history of anemia for at least 10 years, likely more; and history of GI bleeding from AVMs -She has received various formulations of IV iron in the past (ferlecit in 2020, Feraheme in 02/2022), B12 injections (last in 2021), and ESA (Retacrit, 2021) -During her most recent  hospitalization 02/13/22 - 03/01/2022 she had 2 duodenal angioectasias that were treated with APC, 2 doses of Feraheme, and a total of 6 units of red cell transfusions (she had a transfusion reaction and required ICU placement and premeds for subsequent transfusions); melena resolved at discharge -hemoglobin stable in the 9 range; ferritin is high, hold additional iron for now   Weakness, deconditioning  -Prior to her hospitalization in October she was functional at home, cooking, cleaning, and driving locally -after hospital d/c she was more sedentary, requires a walker, and is on 2-3 L of oxygen; she is profoundly deconditioned -She gets PT at home through [facility] -Her fatigue and weakness seem out of proportion to the low disease burden, but certainly could be related, and is multifactorial secondary to her anemia and other comorbidities -She feels stronger after starting treatment   COVID-19, new oxygen requirement, pleural effusions, h/o COPD -She smoked for 30 years in the past, less than 1 pack/day.  She has COPD but no prior oxygen requirement.  -She underwent therapeutic thoracentesis in the hospital with 800-900 cc removed from each lung; cytology not done -Unclear how much of this is related to the neuroendocrine tumor, possibly recent COVID, or other -She recently developed sinus and R ear infection, on cefdinir per PCP. Improving -On 2 L O2, still working with PCP and [employer] to get portable oxygen tank   Afib, CHF, carotid stenosis, HTN, ICD placement -She is on Eliquis for permanent Afib, cardiology prefers she stay on this for high risk. -We will monitor hgb closely given her bleeding risk on ac.  -recent echo 02/15/2022 showed LVEF 60-65% -leg edema improving on the current dose of torsemide (60 mg  total daily), which is lower dose than her previous requirement. She will f/up with cardiology  -Continue PCP and cardiology follow-up   H/o Renal cell carcinoma and  CKD -S/p right nephrectomy in 2003 -Baseline SCr 1.2-1.8 since 2019 -Stable   Social -Patient's 98 year old great grandson lives with her, he is a Airline pilot and assists around the house -Has 2 children, her niece has been present for the past 2 visits -She has strong family support   Goals of care -She met with palliative care team while in the hospital to establish relationship and goals of care -She understands that without surgery this is not curable disease, but still treatable; she is interested in treatment for now; goal is palliative  PLAN: -Labs reviewed -#3 Sandostatin today as planned, continue q4 weeks -F/up and next inj in 4 weeks -Can try skin/hair/nail vitamin for dryness and hair loss    All questions were answered. The patient knows to call the clinic with any problems, questions or concerns. No barriers to learning were detected.    Cira Rue, NP-C 05/27/2022

## 2022-05-28 ENCOUNTER — Ambulatory Visit: Payer: PPO | Admitting: Pharmacist

## 2022-05-28 ENCOUNTER — Telehealth: Payer: Self-pay | Admitting: Pharmacist

## 2022-05-28 LAB — CHROMOGRANIN A: Chromogranin A (ng/mL): 58.8 ng/mL (ref 0.0–101.8)

## 2022-05-28 MED ORDER — PANTOPRAZOLE SODIUM 40 MG PO TBEC: 40.0000 mg | DELAYED_RELEASE_TABLET | Freq: Every day | ORAL | 1 refills | Status: DC

## 2022-05-28 NOTE — Patient Instructions (Signed)
Visit Information  Phone number for Pharmacist: [phone]  Thank you for meeting with me to discuss your medications! Below is a summary of what we talked about during the visit:   Recommendations/Changes made from today's visit: -Recommend to refill pantoprazole 40 mg daily - see phone note -Advised pt to pick up rosuvastatin at pharmacy  Follow up plan: -Pharmacist follow up televisit scheduled for 6 months -PCP appt 07/24/22   Charlene Brooke, PharmD, BCACP Clinical Pharmacist [facility] at [facility] [phone]

## 2022-05-28 NOTE — Telephone Encounter (Signed)
Pantoprazole is on med list for GI protection after GI bleed in October. The Rx ran out and pt is no longer taking. She continues on Eliquis and would benefit from continued PPI use. I recommend to refill pantoprazole 40 mg once daily. Routing to PCP for input.  Preferred pharmacy: [place], [place] - [address] [place] [address] Phone: [phone] Fax: [phone]

## 2022-05-28 NOTE — Telephone Encounter (Signed)
Agree with continuing pantoprazole at this time, drop to '40mg'$  one tablet daily. New Rx sent to pharmacy. Plz notify pt.  Thanks!

## 2022-05-28 NOTE — Progress Notes (Signed)
Care Management & Coordination Services Pharmacy Note  05/28/2022 Name:  [name] MRN:  [id number] DOB:  August 26, 1933  Summary: Initial visit -Hx GI bleed: pt was on pantoprazole 40 mg BID after hospitalization for bleeding, but Rx ran out ~Nov/Dec and she has not been taking it since. She is on Eliquis and would benefit from continued GI protection with daily PPI -Osteoporosis: reviewed DEXA scan 2010 (T-score -3.0), reviewed pharmacological treatment options and benefits of treatment, pt politely declined additional medication at this time -HF: pt is taking torsemide 60 mg daily; she reports wt is stable at 138# and reports swelling is under control; BP is also at goal in recent visits -HLD/PAD: pt did not pick up rosuvastatin, reviewed benefits again and she agrees to restart  Recommendations/Changes made from today's visit: -Recommend to refill pantoprazole 40 mg daily - see phone note -Advised pt to pick up rosuvastatin at pharmacy  Follow up plan: -Pharmacist follow up televisit scheduled for 6 months -PCP appt 07/24/22    Subjective: [name] is an 89 y.o. year old female who is a primary patient of Ria Bush, MD.  The care coordination team was consulted for assistance with disease management and care coordination needs.    Engaged with patient by telephone for initial visit.  Recent office visits: 05/20/22 Karl Ito, NP:  Right ear pain Start: Cefdinir 300 mg Stop (side effects): Augmentin 05/15/22 Viviana Simpler, MD Acute non-recurrent frontal sinusitis Start: Augmentin 04/18/22 Administered: feraheme  04/29/22 Dr Gwenlyn Found - restart crestor. Hold off on zetia, synthroid until next labwork.  04/24/22 Ria Bush, MD OV; Hospital f/u - zetia, crestor fell of med list. Synthroid also fell of list. Added crestor back. Back on low dose Eliquis.  02/12/22 Ria Bush, MD Covid Positive Abnormal Labs "Your iron levels are again low, your hemoglobin  has dropped from 11 to 8.9. Reach out to GI about your worsening anemia. If feeling worse, or ongoing black stools, go straight to the ER"  Stop: Molnupiravir 800 mg per patient preference   02/08/22 Waunita Schooner, MD Covid-19 Start: albuterol, Lagevrio, prednisone .   12/20/21 Waunita Schooner, MD HTN Abnormal Labs "Your kidney function is a little worse than the last check. How are you doing off the medication?" Stop: Losartan Potassium 12.5 mg   Recent consult visits: 04/18/22 Cira Rue, NP (Oncology) Neuroendocrine tumor of pancreas  Administered: octreotide 20 mg IM Injection. 03/27/22 Administered: feraheme 03/20/22 Cira Rue, NP (Oncology) Neuroendocrine tumor of pancreas. Administered: octreotide 20 mg IM Injection. 03/04/22 Endoscopy 02/04/22  Brendolyn Patty, MD (Derm) Squamous cell carcinoma of skin Start: doxycycline (ADOXA) 100 MG tablet  Start: mupirocin ointment (BACTROBAN) 2 % Destruction of lesions  01/22/22 Brendolyn Patty, MD (Derm) Neoplasm of uncertain behavior of skin Biopsy of lesions 01/09/22 Administered: feraheme 01/02/22 Administered: feraheme 12/26/21 Vito Cirigliano, DO (Gastro) AVM Start: Octreotide Acetate 10 mg Schedule IV iron treatment 12/19/21 Brendolyn Patty, MD (Derm) Neoplasm of uncertain behavior of skin Start: Clobetasol Propionate 0.05% Destruction of lesion  Hospital visits: 02/13/22 - 03/01/22 Admission [facility]): acute GI bleeding - 2/2 pancreatic tumor. Will not resume Eliquis until f/u with GI. D/C with PPI BID.    Objective:  Lab Results  Component Value Date   CREATININE 1.52 (H) 05/27/2022   BUN 36 (H) 05/27/2022   GFR 34.44 (L) 05/20/2022   GFRNONAA 33 (L) 05/27/2022   GFRAA 39 (L) 12/23/2019   NA 139 05/27/2022   K 4.9 05/27/2022   CALCIUM 10.2 05/27/2022  CO2 36 (H) 05/27/2022   GLUCOSE 92 05/27/2022    Lab Results  Component Value Date/Time   HGBA1C 4.6 (L) 02/09/2021 02:07 AM   HGBA1C 6.1 (H) 08/17/2014 05:00 AM   GFR 34.44 (L)  05/20/2022 12:47 PM   GFR 28.53 (L) 12/20/2021 12:00 PM    Last diabetic Eye exam: No results found for: "HMDIABEYEEXA"  Last diabetic Foot exam: No results found for: "HMDIABFOOTEX"   Lab Results  Component Value Date   CHOL 101 02/09/2021   HDL 28 (L) 02/09/2021   LDLCALC 58 02/09/2021   LDLDIRECT 146.0 11/07/2016   TRIG 74 02/09/2021   CHOLHDL 3.6 02/09/2021       Latest Ref Rng & Units 05/27/2022    9:16 AM 05/20/2022   12:47 PM 04/18/2022   12:09 PM  Hepatic Function  Total Protein 6.5 - 8.1 g/dL 7.2  7.3  7.4   Albumin 3.5 - 5.0 g/dL 4.0  4.2  4.3   AST 15 - 41 U/L '11  12  13   '$ ALT 0 - 44 U/L '5  4  6   '$ Alk Phosphatase 38 - 126 U/L 51  53  48   Total Bilirubin 0.3 - 1.2 mg/dL 0.4  0.4  0.6     Lab Results  Component Value Date/Time   TSH 1.835 02/13/2022 04:25 PM   TSH 1.83 11/08/2021 01:06 PM   TSH 1.33 03/08/2021 01:15 PM       Latest Ref Rng & Units 05/27/2022    9:16 AM 05/20/2022   12:47 PM 04/18/2022   12:09 PM  CBC  WBC 4.0 - 10.5 K/uL 3.8  3.6  3.4   Hemoglobin 12.0 - 15.0 g/dL 9.2  9.8  9.9   Hematocrit 36.0 - 46.0 % 29.7  30.4  32.1   Platelets 150 - 400 K/uL 192  208.0  193    Iron/TIBC/Ferritin/ %Sat    Component Value Date/Time   IRON 32 05/27/2022 0916   TIBC 228 (L) 05/27/2022 0916   FERRITIN 249 05/27/2022 0916   IRONPCTSAT 14 05/27/2022 0916   IRONPCTSAT 21 08/16/2021 1303     Lab Results  Component Value Date/Time   VD25OH 56.32 11/08/2021 01:06 PM   VITAMINB12 2,173 (H) 02/13/2022 04:40 PM   VITAMINB12 992 (H) 11/08/2021 01:06 PM    Clinical ASCVD: Yes  The ASCVD Risk score (Arnett DK, et al., 2019) failed to calculate for the following reasons:   The 2019 ASCVD risk score is only valid for ages 53 to 42    CHA2DS2/VAS Stroke Risk Points  Current as of 6 hours ago     9 >= 2 Points: High Risk  1 - 1.99 Points: Medium Risk  0 Points: Low Risk    Last Change: N/A      Points Metrics  1 Has Congestive Heart Failure:  Yes     Current as of 6 hours ago  1 Has Vascular Disease:  Yes    Current as of 6 hours ago  1 Has Hypertension:  Yes    Current as of 6 hours ago  2 Age:  37    Current as of 6 hours ago  1 Has Diabetes:  Yes     Current as of 6 hours ago  2 Had Stroke:  No  Had TIA:  Yes  Had Thromboembolism:  No    Current as of 6 hours ago  1 Female:  Yes    Current as of  6 hours ago        09/24/2021    1:03 PM 02/09/2020    4:09 PM 02/07/2017   12:10 PM  Depression screen PHQ 2/9  Decreased Interest 0 0 0  Down, Depressed, Hopeless 0 0 0  PHQ - 2 Score 0 0 0     Social History   Tobacco Use  Smoking Status Former   Packs/day: 0.50   Years: 40.00   Total pack years: 20.00   Types: Cigarettes   Quit date: 05/06/2001   Years since quitting: 21.0  Smokeless Tobacco Never   BP Readings from Last 3 Encounters:  05/27/22 (!) 138/58  05/20/22 (!) 112/58  05/15/22 120/66   Pulse Readings from Last 3 Encounters:  05/27/22 60  05/20/22 60  05/15/22 60   Wt Readings from Last 3 Encounters:  05/27/22 138 lb 4.8 oz (62.7 kg)  05/20/22 138 lb (62.6 kg)  05/15/22 138 lb (62.6 kg)   BMI Readings from Last 3 Encounters:  05/27/22 21.99 kg/m  05/20/22 21.94 kg/m  05/15/22 21.94 kg/m    Allergies  Allergen Reactions   Dilaudid [Hydromorphone] Other (See Comments)    Excessive Somnolence- Required Narcan   Fentanyl Other (See Comments)    Required Narcan when given with Dilaudid. Tolerated single dose when given during procedure 02/03/2019.    Codeine Nausea And Vomiting    Hallucinations    Morphine And Related Nausea And Vomiting    Hallucinations    Amoxicillin Nausea Only   Hydrocodone Nausea Only and Other (See Comments)    Nausea and lethargy   Oxycodone Nausea Only and Other (See Comments)    Nausea and lethargy   Tramadol Nausea Only and Other (See Comments)    Nausea and lethargy   Whole Blood Itching and Nausea And Vomiting    Pt and family stated pt will receive blood  products but had a reaction requiring benadryl on a previous administration.  Needs premedication with tylenol, benadryl, steroids for future transfusions    Sulfonamide Derivatives Other (See Comments)    Dry mouth    Medications Reviewed Today     Reviewed by Charlton Haws, [facility] (Pharmacist) on 05/28/22 at 1457  Med List Status: <None>   Medication Order Taking? Sig Documenting Provider Last Dose Status Informant  acetaminophen (TYLENOL) 500 MG tablet [id number] Yes Take 500-1,000 mg by mouth every 8 (eight) hours as needed for mild pain or headache. [provider] Taking Active Self, Pharmacy Records  albuterol (VENTOLIN HFA) 108 (90 Base) MCG/ACT inhaler [id number] No Inhale 2 puffs into the lungs every 6 (six) hours as needed for wheezing or shortness of breath.  Patient not taking: Reported on 05/28/2022   Waunita Schooner, MD Not Taking Active Self, Pharmacy Records  apixaban [facility]) 2.5 MG TABS tablet [id number] Yes Take 1 tablet (2.5 mg total) by mouth 2 (two) times daily. Mansouraty, Telford Nab., MD Taking Active   cefdinir (OMNICEF) 250 MG/5ML suspension [id number] Yes Take 6 mLs (300 mg total) by mouth 2 (two) times daily. Michela Pitcher, NP Taking Active   clobetasol cream (TEMOVATE) 0.05 % [id number] No Apply to affected area left foot twice a day until improved. Avoid face, groin, axilla.  Patient not taking: Reported on 05/28/2022   Brendolyn Patty, MD Not Taking Active Self, Pharmacy Records  Cyanocobalamin (B-12 PO) [id number] Yes Take 1 tablet by mouth daily. [provider] Taking Active Self, Pharmacy Records  Med Note [facility], BRANDY L   Tue Jun 27, 2020 12:32 PM)    fluticasone (FLONASE) 50 MCG/ACT nasal spray [id number] Yes Place 2 sprays into both nostrils daily as needed for allergies or rhinitis. Waunita Schooner, MD Taking Active Self, Pharmacy Records  pantoprazole (PROTONIX) 40 MG tablet [id number] Yes Take 1 tablet (40 mg total) by  mouth 2 (two) times daily. Thurnell Lose, MD Taking Active   potassium chloride SA (KLOR-CON M) 20 MEQ tablet [id number] Yes Take 1 tablet (20 mEq total) by mouth daily. Thurnell Lose, MD Taking Active   rosuvastatin (CRESTOR) 40 MG tablet [id number] Yes Take 1 tablet (40 mg total) by mouth daily. Ria Bush, MD Taking Active   torsemide (DEMADEX) 20 MG tablet [id number] Yes TAKE 4 TABLETS ('80MG'$ ) BY MOUTH EVERY EVENING. TO BE TAKEN WITH '100MG'$  TABLET IN THE AM  Patient taking differently: Take 60 mg by mouth daily.   [place], Maricela Bo, FNP Taking Active   vitamin C (ASCORBIC ACID) 250 MG tablet [id number] Yes Take 500 mg by mouth daily. [provider] Taking Active Self, Pharmacy Records           Med Note Angelica Ran May 28, 2022  2:57 PM)    VITAMIN D PO [id number] Yes Take 4,000 Units by mouth daily. [provider] Taking Active Self, Pharmacy Records            SDOH:  (Social Determinants of Health) assessments and interventions performed: Yes SDOH Interventions    [place] Coordination from 05/28/2022 in [place]  SDOH Interventions   Food Insecurity Interventions Intervention Not Indicated  Financial Strain Interventions Intervention Not Indicated       Medication Assistance: None required.  Patient affirms current coverage meets needs.  Medication Access: Within the past 30 days, how often has patient missed a dose of medication? 0 Is a pillbox or other method used to improve adherence? No  Factors that may affect medication adherence? lack of understanding of disease management and lack of refills Are meds synced by current pharmacy? No  Are meds delivered by current pharmacy? No  Does patient experience delays in picking up medications due to transportation concerns? No   [facility] Services Reviewed: Is patient disadvantaged to use [facility]?: Yes  Current Rx insurance plan: HTA Name and  location of Current pharmacy:  [place], [place] [address] [place] [address] Phone: [phone] Fax: [phone]  [facility] services reviewed with patient today?: No  Patient requests to transfer care to [facility]?: No  Reason patient declined to change pharmacies: Disadvantaged due to insurance/mail order  Compliance/Adherence/Medication fill history: Care Gaps: Mammogram (due 10/2021)  Star-Rating Drugs: Rosuvastatin - PDC 16%   Assessment/Plan   Hyperlipidemia: (LDL goal < 70) -Query Controlled - LDL 48 (02/2021) while pt was on crestor, zetia. Zetia was held after hospitalization 02/2022, planning to recheck lipids at next PCP appt -Pt reports she has not been taking rosuvastatin, she was not aware Rx was sent to pharmacy -Hx PAD, TIA.  -Current treatment: Rosuvastatin 40 mg daily - Appropriate, Query Effective -Medications previously tried: ezetimibe, atorvastatin (difficulty swallowing large pills) -Educated on Cholesterol goals; Benefits of statin for ASCVD risk reduction; -Recommended to continue current medication - pt will contact pharmacy to pick up statin  Atrial Fibrillation (Goal: prevent stroke and major bleeding) -Controlled -  -CHADSVASC: 9 -Current treatment: Eliquis 2.5 mg BID -  Appropriate, Effective, Safe, Accessible -Medications previously tried: amiodarone, dofetilide -Counseled on increased risk of stroke due to Afib and benefits of anticoagulation for stroke prevention; bleeding risk associated with Eliquis and importance of self-monitoring for signs/symptoms of bleeding; -Recommended to continue current medication  Heart Failure (Goal: manage symptoms and prevent exacerbations) -Controlled - BP recently at goal in OV;  -Last ejection fraction: 60-65% (Date: 02/2022) -HF type: HFpEF (EF > 50%) -NYHA Class: II (slight limitation of activity) -Current home daily weights:  138# -Current home BP/HR readings: not checking daily, -Current treatment: Torsemide 20 mg - 3 tablets PM - Appropriate, Effective, Safe, Accessible Klor Con 20 meq daily -Appropriate, Effective, Safe, Accessible -Medications previously tried: carvedilol,  furosemide, losartan, hctz, metolazone, verapamil -Educated on Benefits of medications for managing symptoms and prolonging life Importance of weighing daily; if you gain more than 3 pounds in one day or 5 pounds in one week, call cardiology -Reviewed importance of monitoring BP and weights at home, for now there is no indication to change medication -Recommended to continue current medication  COPD (Goal: control symptoms and prevent exacerbations) -Controlled - pt has not needed albuterol since she had covid in Oct 2023 -wearing O2 while walking -Gold Grade: unknown -Current COPD Classification:  A (low sx, 0-1 moderate exacerbations, no hospitalizations) -Pulmonary function testing: not on file -Exacerbations requiring treatment in last 6 months: 0 -Current treatment  Albuterol HFA PRN - not needed Oxygen -Medications previously tried: n/a  -Patient denies consistent use of maintenance inhaler -Frequency of rescue inhaler use: never -Recommended to continue current medication  Osteoporosis (Goal: prevent fracture) -Not ideally controlled -Last DEXA Scan: 04/2009   T-Score femoral neck: -3.0 -Patient is a candidate for pharmacologic treatment due to T-Score < -2.5 in femoral neck -Current treatment  Calcium-Vitamin D - Appropriate, Effective, Safe, Accessible -Medications previously tried: n/a  -Recommend [phone] units of vitamin D daily. Recommend 1200 mg of calcium daily from dietary and supplemental sources. Recommend weight-bearing and muscle strengthening exercises for building and maintaining bone density. -Reviewed osteoporosis treatment options, benefits for reducing fracture risk; pt politely declined pharmacologic  treatment  Hx GI Bleed (Goal: prevent recurrence) -Not ideally controlled - pt stopped taking PPI when Rx ran out (~Nov-Dec 2023);  -Current treatment  Pantoprazole 40 mg BID - not taking -Medications previously tried: n/a  -Reviewed bleeding history, pt would still benefit from daily PPI given continued Eliquis -Reviewed iron - pt is not taking iron currently, she follows regularly with oncology and had labwork recently, discussed most recent advice from oncology was to hold off on restarting iron; advised to await further advice from oncology -Recommend to refill pantoprazole 40 mg daily  Health Maintenance -Vaccine gaps: Shingrix, TDAP, covid booster -Neuronendocrine pancreatic tumor Dx 02/2022. On octreotide injections monthly. Goal is palliative    Charlene Brooke, PharmD, BCACP Clinical Pharmacist [facility] at [facility] [phone]

## 2022-06-05 DIAGNOSIS — K8689 Other specified diseases of pancreas: Secondary | ICD-10-CM | POA: Diagnosis not present

## 2022-06-05 DIAGNOSIS — J449 Chronic obstructive pulmonary disease, unspecified: Secondary | ICD-10-CM | POA: Diagnosis not present

## 2022-06-05 DIAGNOSIS — R2689 Other abnormalities of gait and mobility: Secondary | ICD-10-CM | POA: Diagnosis not present

## 2022-06-05 DIAGNOSIS — Z4789 Encounter for other orthopedic aftercare: Secondary | ICD-10-CM | POA: Diagnosis not present

## 2022-06-05 DIAGNOSIS — I5033 Acute on chronic diastolic (congestive) heart failure: Secondary | ICD-10-CM | POA: Diagnosis not present

## 2022-06-05 DIAGNOSIS — M6281 Muscle weakness (generalized): Secondary | ICD-10-CM | POA: Diagnosis not present

## 2022-06-05 DIAGNOSIS — J90 Pleural effusion, not elsewhere classified: Secondary | ICD-10-CM | POA: Diagnosis not present

## 2022-06-10 ENCOUNTER — Encounter (HOSPITAL_COMMUNITY): Payer: Self-pay | Admitting: Cardiology

## 2022-06-10 ENCOUNTER — Ambulatory Visit: Payer: PPO

## 2022-06-10 ENCOUNTER — Other Ambulatory Visit (HOSPITAL_COMMUNITY): Payer: Self-pay

## 2022-06-10 ENCOUNTER — Ambulatory Visit (HOSPITAL_COMMUNITY)
Admission: RE | Admit: 2022-06-10 | Discharge: 2022-06-10 | Disposition: A | Discharge status: Home / Self Care | Payer: PPO | Source: Ambulatory Visit | Attending: Cardiology | Admitting: Cardiology

## 2022-06-10 VITALS — BP 120/58 | HR 66 | Wt 134.8 lb

## 2022-06-10 DIAGNOSIS — Z79899 Other long term (current) drug therapy: Secondary | ICD-10-CM | POA: Diagnosis not present

## 2022-06-10 DIAGNOSIS — I428 Other cardiomyopathies: Secondary | ICD-10-CM

## 2022-06-10 DIAGNOSIS — I5042 Chronic combined systolic (congestive) and diastolic (congestive) heart failure: Secondary | ICD-10-CM | POA: Insufficient documentation

## 2022-06-10 DIAGNOSIS — I4821 Permanent atrial fibrillation: Secondary | ICD-10-CM

## 2022-06-10 DIAGNOSIS — N183 Chronic kidney disease, stage 3 unspecified: Secondary | ICD-10-CM | POA: Insufficient documentation

## 2022-06-10 DIAGNOSIS — I272 Pulmonary hypertension, unspecified: Secondary | ICD-10-CM | POA: Diagnosis not present

## 2022-06-10 DIAGNOSIS — J449 Chronic obstructive pulmonary disease, unspecified: Secondary | ICD-10-CM | POA: Diagnosis not present

## 2022-06-10 DIAGNOSIS — I5032 Chronic diastolic (congestive) heart failure: Secondary | ICD-10-CM | POA: Diagnosis not present

## 2022-06-10 DIAGNOSIS — I082 Rheumatic disorders of both aortic and tricuspid valves: Secondary | ICD-10-CM | POA: Diagnosis not present

## 2022-06-10 DIAGNOSIS — I130 Hypertensive heart and chronic kidney disease with heart failure and stage 1 through stage 4 chronic kidney disease, or unspecified chronic kidney disease: Secondary | ICD-10-CM | POA: Diagnosis not present

## 2022-06-10 DIAGNOSIS — Z7901 Long term (current) use of anticoagulants: Secondary | ICD-10-CM | POA: Insufficient documentation

## 2022-06-10 DIAGNOSIS — D649 Anemia, unspecified: Secondary | ICD-10-CM | POA: Diagnosis not present

## 2022-06-10 LAB — BASIC METABOLIC PANEL
Anion gap: 12 (ref 5–15)
BUN: 35 mg/dL — ABNORMAL HIGH (ref 8–23)
CO2: 28 mmol/L (ref 22–32)
Calcium: 9.2 mg/dL (ref 8.9–10.3)
Chloride: 97 mmol/L — ABNORMAL LOW (ref 98–111)
Creatinine, Ser: 1.53 mg/dL — ABNORMAL HIGH (ref 0.44–1.00)
GFR, Estimated: 33 mL/min — ABNORMAL LOW (ref >60)
Glucose, Bld: 90 mg/dL (ref 70–99)
Potassium: 3.8 mmol/L (ref 3.5–5.1)
Sodium: 137 mmol/L (ref 135–145)

## 2022-06-10 LAB — BRAIN NATRIURETIC PEPTIDE: B Natriuretic Peptide: 208.4 pg/mL — ABNORMAL HIGH (ref 0.0–100.0)

## 2022-06-10 MED ORDER — DAPAGLIFLOZIN PROPANEDIOL 10 MG PO TABS: 10.0000 mg | ORAL_TABLET | Freq: Every day | ORAL | 11 refills | Status: DC

## 2022-06-10 MED ORDER — TORSEMIDE 20 MG PO TABS: 80.0000 mg | ORAL_TABLET | Freq: Every day | ORAL | 4 refills | Status: DC

## 2022-06-10 NOTE — Progress Notes (Signed)
PCP: Ria Bush, MD Cardiology: Dr. Rockey Situ HF Cardiology: Dr. [name]  89 y.o. with history of permanent atrial fibrillation, chronic diastolic CHF, CKD stage 3, smoking/prior COPD, renal cell CA s/p right nephrectomy.  She has a prior history of HFrEF with nonischemic cardiomyoapthy and had a Medtronic ICD placed.  However, subsequently her EF has recovered.  More recently, she has had symptomatic HFpEF.    She has had dyspnea and edema for "a long time," worse starting 1/20. In 6/20, she had left hip ORIF after mechanical fall.  Echo in 6/20 showed EF 60-65%, mild RV dilation with normal systolic function, PASP 93 mmHg.  After she got home from the hospital stay post-ORIF, she noted marked peripheral edema.  She was short of breath walking short distances.  +orthopnea.  She came back to the hospital on 11/04/18 and was admitted with volume overload.  She was diuresed aggressively and lost about 22 lbs.  RHC showed pulmonary venous hypertension.  PCWP tracing had prominent v-waves in absence of significant MR, suggesting significant diastolic dysfunction.   She has been noted to have significant Fe deficiency anemia, but FOBT was negative during 7/20 hospitalization.   She was admitted in 11/20 with symptomatic anemia, GI workup concerning for ischemic bowel.  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 Doren Custard with VVS and conservative management recommended.  Capsule endoscopy showed no definite source of bleeding.  She was volume overloaded due to significant RV dysfunction and diuresed, she also developed AKI on CKD stage 3.      In 10/22, she had bilateral CIA stents placed. ABIs in 11/22 showed patent stents.  She had a skin cancer removed from her left leg. 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   Echo 2/23 EF 65-70% with mild LVH, D-shaped septum with moderate RV enlargement, and mildly decreased RV function, PASP 92,  moderate-severe TR, dilated IVC, moderate aortic stenosis.  Echo in 10/23 showed EF 60-65%, D-shaped septum, mild RV enlargement with normal RV systolic function, PASP 90, moderate-severe TR, mild AS, IVC dilated.   Patient was found to have a neuroendocrine tumor of the pancreatic head in the fall of 2023.  She has been treated with sandostatin monthly.  She also had further GI bleeding from small bowel AVMs in fall 2023.  She also had COVID-19 in 10/23 and has been on home oxygen 2L since that time.   She returns for followup of CHF/RV failure.  She is on a lower dose of torsemide than in the past, 60 mg daily.  Weight is down 18 lbs.  She is off Crestor, not sure why.  Her Cardiomems is pillow is not transmitting.  She does ok walking around her house with her oxygen on.  She gets short of breath walking longer distances.  No chest pain.  No orthopnea/PND.  No lightheadedness.   ECG (personally reviewed): atrial fibrillation, RV-paced  Medtronic device interrogation: Optivol chronically elevated with fluid index > threshold.  85% RV pacing.   Labs (7/20): K 3.7, creatinine 1.44 => 1.33 Labs (12/20): pro-BNP 431, K 4.4, creatinine 1.24, hgb 8.2 Labs (10/21): K 4, creatinine 1.83 Labs (12/21): K 3.5, creatinine 1.45 Labs (2/22): K 5.8 (Lokelma ordered), creatinine 1.39 Labs (3/22): K 4.4  Creatinine 1.42 Labs (8/22): K 4.2, creatinine 1.46, hgb 10.7 Labs (11/22): K 4.5, creatinine 1.69, LFTs normal Labs (1/23): hgb 11, K 4.2, creatinine 1.63, BNP 252 Labs (2/23): K 3.5, creatinine  1.37 Labs (4/23): K 4.0, creatinine 1.38 Labs (1/24): K 4.9, creatinine 1.52, hgb 9.2, LFTs normal  PMH: 1. COPD 2. Renal cell carcinoma: S/p right kidney resection.  3. CKD stage 3 4. Fe deficiency anemia 5. Atrial fibrillation: Permanent. She has failed amiodarone, Tikosyn, and flecainide as well as multiple cardioversions. 6. Chronic diastolic CHF: She has history of prior HFrEF with nonischemic  cardiomyopathy.  She has a Medtronic ICD.   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  - More recently, EF has been in normal range but she has had diastolic CHF.  - Echo ([id number]): EF 60-65%, mildly dilated RV with PASP 93 mmHg, mild-moderate TR.  - RHC (7/20): mean RA 11, PA 64/18 mean 39, mean PCWP 24 with v waves to 41, CI 4.21, PVR 1.9 WU.  - Cardiomems placement - RHC (11/20): mean RA 13, PA 63/23 mean 25 with v waves to 47 (MR only mild by echo), mean PCWP 25, CI 3.18, PVR 2.6 WU - Echo (11/21): EF 65-70%, moderate LVH, D-shaped septum with mildly decreased RV systolic function, RVSP 0000000 mmHg, severe RAE, severe TR, mod-sev AS. - Echo (2/23): EF 65-70% with mild LVH, D-shaped septum with moderate RV enlargement, and mildly decreased RV function, PASP 92, moderate-severe TR, dilated IVC, moderate aortic stenosis.  - Echo (10/23): EF 60-65%, D-shaped septum, mild RV enlargement with normal RV systolic function, PASP 90, moderate-severe TR, mild AS, IVC dilated.  7. Hyperlipidemia 8. Carotid stenosis: Carotid dopplers ([id number]) with [id number] LICA stenosis.   9. Possible ischemic bowel in 11/20 10. 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 Conservative management per Dr. Doren Custard.  - 10/22 bilateral CIA stents.  - 11/22 ABIs: 1.07 left, 0.83 right with patent CIA stents.  11. 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  12. Squamous cell skin cancer.  13. Aortic stenosis: Mild on 10/23 echo.  14. 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  15. COVID 19 in 10/23  Social History   Socioeconomic History   Marital status: Widowed    Spouse name: Not on file   Number of children: 2   Years of education: high school   Highest education level: Not on file  Occupational History   Occupation: Retired    Fish farm manager: RETIRED  Tobacco Use   Smoking status: Former    Packs/day: 0.50    Years: 40.00    Total pack years:  20.00    Types: Cigarettes    Quit date: 05/06/2001    Years since quitting: 21.1   Smokeless tobacco: Never  Vaping Use   Vaping Use: Never used  Substance and Sexual Activity   Alcohol use: No    Alcohol/week: 0.0 standard drinks of alcohol   Drug use: No   Sexual activity: Not Currently  Other Topics Concern   Not on file  Social History Narrative   Would desire CPR   07/11/20   From: the area   Living: with great-grandson - [name] (2003)   Work: retired - clerical      Family: 2 children - [name] and [place] - 2 grand chlidren - 5 great grand children      Enjoys: spending time with friends - watch move, eat out, spend time      Exercise: walking around the house   Diet: not good, limits red meat, limits salt, low appetite      Safety   Seat belts: Yes  Guns: Yes  and secure   Safe in relationships: Yes    Social Determinants of Health   Financial Resource Strain: Low Risk  (05/28/2022)   Overall Financial Resource Strain (CARDIA)    Difficulty of Paying Living Expenses: Not hard at all  Food Insecurity: No Food Insecurity (05/28/2022)   Hunger Vital Sign    Worried About Running Out of Food in the Last Year: Never true    Ran Out of Food in the Last Year: Never true  Transportation Needs: No Transportation Needs (02/14/2022)   PRAPARE - Hydrologist (Medical): No    Lack of Transportation (Non-Medical): No  Physical Activity: Inactive (09/24/2021)   Exercise Vital Sign    Days of Exercise per Week: 0 days    Minutes of Exercise per Session: 0 min  Stress: No Stress Concern Present (09/24/2021)   [place]    Feeling of Stress : Not at all  Social Connections: Not on file  Intimate Partner Violence: Not At Risk (02/14/2022)   Humiliation, Afraid, Rape, and Kick questionnaire    Fear of Current or Ex-Partner: No    Emotionally Abused: No    Physically Abused: No     Sexually Abused: No   Family History  Problem Relation Age of Onset   Heart failure Mother    Early death Father        car accident   Breast cancer Maternal Aunt 82   Breast cancer Cousin    Breast cancer Other    Colon cancer Neg Hx    Esophageal cancer Neg Hx    Liver disease Neg Hx    Pancreatic cancer Neg Hx    Rectal cancer Neg Hx    ROS: All systems reviewed and negative except as per HPI.   Current Outpatient Medications  Medication Sig Dispense Refill   acetaminophen (TYLENOL) 500 MG tablet Take 500-1,000 mg by mouth every 8 (eight) hours as needed for mild pain or headache.     apixaban (ELIQUIS) 2.5 MG TABS tablet Take 1 tablet (2.5 mg total) by mouth 2 (two) times daily. 60 tablet 0   Cyanocobalamin (B-12 PO) Take 1 tablet by mouth daily.     dapagliflozin propanediol (FARXIGA) 10 MG TABS tablet Take 1 tablet (10 mg total) by mouth daily before breakfast. 30 tablet 11   fluticasone (FLONASE) 50 MCG/ACT nasal spray Place 2 sprays into both nostrils daily as needed for allergies or rhinitis. 16 g 6   pantoprazole (PROTONIX) 40 MG tablet Take 1 tablet (40 mg total) by mouth daily. 90 tablet 1   potassium chloride SA (KLOR-CON M) 20 MEQ tablet Take 1 tablet (20 mEq total) by mouth daily. 30 tablet 0   vitamin C (ASCORBIC ACID) 250 MG tablet Take 500 mg by mouth daily.     VITAMIN D PO Take 4,000 Units by mouth daily.     rosuvastatin (CRESTOR) 40 MG tablet Take 1 tablet (40 mg total) by mouth daily. (Patient not taking: Reported on 06/10/2022) 30 tablet 11   torsemide (DEMADEX) 20 MG tablet Take 4 tablets (80 mg total) by mouth daily. 200 tablet 4   No current facility-administered medications for this encounter.   Wt Readings from Last 3 Encounters:  06/10/22 61.1 kg (134 lb 12.8 oz)  05/27/22 62.7 kg (138 lb 4.8 oz)  05/20/22 62.6 kg (138 lb)   BP (!) 120/58   Pulse 66  Wt 61.1 kg (134 lb 12.8 oz)   SpO2 100% Comment: 2l n/c  BMI 21.43 kg/m    PHYSICAL  EXAM: General: NAD Neck: JVP 9-10 cm, no thyromegaly or thyroid nodule.  Lungs: Clear to auscultation bilaterally with normal respiratory effort. CV: Nondisplaced PMI.  Heart irregular S1/S2, no S3/S4, 3/6 SEM RUSB with clear S2.  Trace ankle edema.  No carotid bruit.  Normal pedal pulses.  Abdomen: Soft, nontender, no hepatosplenomegaly, no distention.  Skin: Intact without lesions or rashes.  Neurologic: Alert and oriented x 3.  Psych: Normal affect. Extremities: No clubbing or cyanosis.  HEENT: Normal.   Assessment/Plan: 1. Chronic diastolic CHF: With prominent RV failure.  Echo in 6/20 showed EF 60-65%, mild RV dilation with normal systolic function, PASP 93 mmHg.  She had remote nonischemic cardiomyopathy with recovery of EF, has Medtronic ICD.  I suspect that there is significant RV dysfunction. Atrial fibrillation likely contributes, but this appears to be permanent now as she has failed multiple anti-arrhythmics and is reasonably rate-controlled. [place] 11/20 showed elevated filling pressures and pulmonary venous hypertension.  Prominent v-waves on PCWP tracing likely due to stiff ventricle/diastolic dysfunction as she had only mild MR on echo.  Echo 10/23 with EF 60-65%, D-shaped septum, mild RV enlargement with normal RV systolic function, PASP 90, moderate-severe TR, mild AS, IVC dilated. She is RV pacing at a high percentage, but LV EF has remained normal.  NYHA Class II-III symptoms (chronic), stable. She is volume overloaded on exam.  Unable to use Cardiomems (not transmitting). - She has the number for Cardiomems and will call to see if we can get it working.  - Increase torsemide to 80 mg daily with BMET/BNP today and BMET in 10 days.  - Add Farxiga 10 mg daily.  2. Hypertension: BP controlled.  3. Pulmonary hypertension: PA systolic pressure estimated 90 mmHg on last echo. Based on [place] 11/20, she has primarily pulmonary venous hypertension, not candidate for pulmonary vasodilators.   4. CKD: Stage 3.   - BMET today - Adding SGLT2 inhibitor as above.  5. GI bleeding: Due to small bowel AVMs.  - She is being followed by Heme/Onc for her anemia.  6. Atrial fibrillation: Permanent now.  Rate control is reasonable. She has failed amiodarone, Tikosyn, and flecainide as well as multiple cardioversions.  - Continue Eliquis 2.5 mg bid (age, creatinine).  7. Aortic stenosis: Mild on last echo.   Follow up 3 wks with APP.   Loralie Champagne  06/10/2022

## 2022-06-10 NOTE — Patient Instructions (Signed)
INCREASE Torsemide to 80 mg daily.  START Farxiga 10 mg daily.  Labs done today, your results will be available in MyChart, we will contact you for abnormal readings.  Repeat blood work in 10 days  Your physician recommends that you schedule a follow-up appointment as scheduled

## 2022-06-11 LAB — CUP PACEART REMOTE DEVICE CHECK
Battery Remaining Longevity: 26 mo
Battery Voltage: 2.92 V
Brady Statistic AP VP Percent: 0 %
Brady Statistic AP VS Percent: 0 %
Brady Statistic AS VP Percent: 0 %
Brady Statistic AS VS Percent: 0 %
Brady Statistic RA Percent Paced: 0 %
Brady Statistic RV Percent Paced: 85.29 %
Date Time Interrogation Session: 20240205164326
HighPow Impedance: 41 Ohm
HighPow Impedance: 58 Ohm
Implantable Lead Connection Status: 753985
Implantable Lead Connection Status: 753985
Implantable Lead Implant Date: 20061026
Implantable Lead Implant Date: 20111230
Implantable Lead Location: 753859
Implantable Lead Location: 753860
Implantable Lead Model: 5076
Implantable Lead Model: 6947
Implantable Pulse Generator Implant Date: 20171018
Lead Channel Impedance Value: 361 Ohm
Lead Channel Impedance Value: 456 Ohm
Lead Channel Impedance Value: 475 Ohm
Lead Channel Pacing Threshold Amplitude: 0.5 V
Lead Channel Pacing Threshold Pulse Width: 0.4 ms
Lead Channel Sensing Intrinsic Amplitude: 0.25 mV
Lead Channel Sensing Intrinsic Amplitude: 0.375 mV
Lead Channel Sensing Intrinsic Amplitude: 8.75 mV
Lead Channel Sensing Intrinsic Amplitude: 8.75 mV
Lead Channel Setting Pacing Amplitude: 2.5 V
Lead Channel Setting Pacing Pulse Width: 0.4 ms
Lead Channel Setting Sensing Sensitivity: 0.3 mV
Zone Setting Status: 755011

## 2022-06-12 ENCOUNTER — Inpatient Hospital Stay: Payer: PPO | Admitting: Nurse Practitioner

## 2022-06-12 ENCOUNTER — Inpatient Hospital Stay: Payer: PPO

## 2022-06-13 ENCOUNTER — Ambulatory Visit: Payer: PPO | Attending: Internal Medicine | Admitting: Internal Medicine

## 2022-06-13 ENCOUNTER — Encounter: Payer: Self-pay | Admitting: Internal Medicine

## 2022-06-13 VITALS — BP 120/42 | HR 60 | Ht 66.5 in | Wt 134.2 lb

## 2022-06-13 DIAGNOSIS — I4821 Permanent atrial fibrillation: Secondary | ICD-10-CM

## 2022-06-13 DIAGNOSIS — I442 Atrioventricular block, complete: Secondary | ICD-10-CM | POA: Diagnosis not present

## 2022-06-13 DIAGNOSIS — I5032 Chronic diastolic (congestive) heart failure: Secondary | ICD-10-CM

## 2022-06-13 DIAGNOSIS — Z95810 Presence of automatic (implantable) cardiac defibrillator: Secondary | ICD-10-CM

## 2022-06-13 DIAGNOSIS — I428 Other cardiomyopathies: Secondary | ICD-10-CM | POA: Diagnosis not present

## 2022-06-13 NOTE — Progress Notes (Signed)
Patient Care Team: Ria Bush, MD as PCP - General (Family Medicine) Deboraha Sprang, MD as PCP - Cardiology (Cardiology) Larey Dresser, MD as PCP - Advanced Heart Failure (Cardiology) Minna Merritts, MD as Consulting Physician (Cardiology) Deboraha Sprang, MD as Consulting Physician (Cardiology) Delana Meyer, Dolores Lory, MD as Consulting Physician (Vascular Surgery) Sindy Guadeloupe, MD as Consulting Physician (Hematology and Oncology) Truitt Merle, MD as Consulting Physician (Oncology) Charlton Haws, [facility] as Pharmacist (Pharmacist)   HPI  [name] is a 89 y.o. female Seen in followup for nonischemic cardiomyopathy complicated by acute/chronic heart failure and now permanent atrial fibrillation. Primary prevention ICD implanted 2006 with generator replacement x2,  most recently 10/17  Biventricular failure diastolic on the left and systolic on the right with severe pulmonary hypertension which is thought to be primary venous pulmonary hypertension  Anticoagulation with Apixoban  no bleeding  Followed by the heart failure clinic seen most recently earlier this week.  Volume overloaded.  Diuretics increased.  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  There have been no palpitations, lightheadedness or syncope.  Complains of shortness of breath is a little bit better following the diuresis for edema. .       Antiarrhythmics Date Reason stopped  disopyramide 2019 ineffective  amiodarone 2019 nausea  dofetilide 2020 intolerant     DATE TEST EF   2006 LHC `` No Obstructive CAD  1/15 Echo  65 % Mod Pulm HTN, TR and Mild MR   4/16 Echo   65 % Mod-sev PulmHTN with RVE, Mod MR severe TR  6/17 Echo  65% Mild-mod MR  12/18 Myoview 65% No ischemia  7/20 LHC  PA 64/18  PCWP 24  w Vwave 41  7/20 Echo  65% Pulm HTN  11/20 RHC  PA 63 mm; RA 76m  PCWP 25 ( V waves 47)  11/21 Echo  65-70 Pulm HTN  PAsys 86 mm AS mild mean grad 13 ? More serious w low flow  10/23 Echo 60-65% Pulm HTN severe PAsys 90 mm       Date Cr K Hgb TSH LFTs  12/17 1.63 3.5 9.5    7/18 1.5 4.0 10.4    5/19 1.42 4.2 10.6    10/19 2.08 2.8 9.3 7.28   10/19 1.81 3.1 9.1    12/19 1.68 3.5 8.7    7/20 1.45 4.3 10.1 3.68   2/22 1.39 3.4<<5.8 11.2<<8.7              CHADS-VASc score of greater than or equal to 6 (age-10, gender-1, TIA-2 (4/16), heart failure-1)     She has a great grandson TDorothea Ogleto whom she is very attached.  He is a rFurniture conservator/restorerand a cross-country running     Past Surgical History:  Procedure Laterality Date   ABDOMINAL AORTOGRAM W/LOWER EXTREMITY N/A 02/12/2021   Procedure: ABDOMINAL AORTOGRAM W/LOWER EXTREMITY;  Surgeon: CWaynetta Sandy MD;  [place] [facility];  Service: Cardiovascular;  Laterality: N/A;   ABDOMINAL HYSTERECTOMY  1975   for benign causes   APPENDECTOMY     BI-VENTRICULAR PACEMAKER UPGRADE  05/04/2010   BIOPSY  02/28/2019   Procedure: BIOPSY;  Surgeon: JMilus Banister MD;  [place]  Service: Endoscopy;;   BIOPSY  03/04/2022   Procedure: BIOPSY;  Surgeon: Irving Copas., MD;  Location: [facility];  Service: Gastroenterology;;   CARDIAC CATHETERIZATION  2006   CARDIOVERSION N/A 02/20/2018   Procedure: CARDIOVERSION;  Surgeon: Minna Merritts, MD;  Location: [place] [facility];  Service: Cardiovascular;  Laterality: N/A;   CARDIOVERSION N/A 03/27/2018   Procedure: CARDIOVERSION;  Surgeon: Minna Merritts, MD;  Location: [facility];  Service: Cardiovascular;  Laterality: N/A;   CATARACT EXTRACTION W/ INTRAOCULAR LENS  IMPLANT, BILATERAL  01/2006-02-2006   CHOLECYSTECTOMY N/A 11/11/2012   Procedure: LAPAROSCOPIC CHOLECYSTECTOMY WITH INTRAOPERATIVE CHOLANGIOGRAM;  Surgeon: Imogene Burn. Georgette Dover, MD;  Location: [place];   Service: General;  Laterality: N/A;   COLONOSCOPY WITH PROPOFOL N/A 02/28/2019   Procedure: COLONOSCOPY WITH PROPOFOL;  Surgeon: Milus Banister, MD;  Location: [facility]OSCOPY;  Service: Endoscopy;  Laterality: N/A;   ENTEROSCOPY N/A 03/30/2020   Procedure: ENTEROSCOPY;  Surgeon: Lavena Bullion, DO;  Location: [place];  Service: Gastroenterology;  Laterality: N/A;   ENTEROSCOPY N/A 02/10/2021   Procedure: ENTEROSCOPY;  Surgeon: Carol Ada, MD;  Location: [place];  Service: Endoscopy;  Laterality: N/A;   ENTEROSCOPY N/A 02/15/2022   Procedure: ENTEROSCOPY;  Surgeon: Sharyn Creamer, MD;  Location: [facility] ENDOSCOPY;  Service: Gastroenterology;  Laterality: N/A;   EP IMPLANTABLE DEVICE N/A 02/21/2016   Procedure: ICD Generator Changeout;  Surgeon: Deboraha Sprang, MD;  Location: [place] [facility];  Service: Cardiovascular;  Laterality: N/A;   ESOPHAGOGASTRODUODENOSCOPY N/A 03/04/2022   Procedure: ESOPHAGOGASTRODUODENOSCOPY (EGD);  Surgeon: Irving Copas., MD;  Location: [facility];  Service: Gastroenterology;  Laterality: N/A;   ESOPHAGOGASTRODUODENOSCOPY (EGD) WITH PROPOFOL N/A 02/28/2019   Procedure: ESOPHAGOGASTRODUODENOSCOPY (EGD) WITH PROPOFOL;  Surgeon: Milus Banister, MD;  Location: [facility] ENDOSCOPY;  Service: Endoscopy;  Laterality: N/A;   EUS N/A 03/04/2022   Procedure: UPPER ENDOSCOPIC ULTRASOUND (EUS) RADIAL;  Surgeon: Irving Copas., MD;  Location: [facility];  Service: Gastroenterology;  Laterality: N/A;   FINE NEEDLE ASPIRATION N/A 03/04/2022   Procedure: FINE NEEDLE ASPIRATION (FNA) LINEAR;  Surgeon: Irving Copas., MD;  Location: [facility];  Service: Gastroenterology;  Laterality: N/A;   GIVENS CAPSULE STUDY N/A 03/15/2019   Procedure: GIVENS CAPSULE STUDY;  Surgeon: Thornton Park, MD;  Location: [place];  Service: Gastroenterology;  Laterality: N/A;   HOT HEMOSTASIS N/A 03/30/2020   Procedure: HOT HEMOSTASIS (ARGON PLASMA  COAGULATION/BICAP);  Surgeon: Lavena Bullion, DO;  Location: [facility] ENDOSCOPY;  Service: Gastroenterology;  Laterality: N/A;   HOT HEMOSTASIS N/A 02/10/2021   Procedure: HOT HEMOSTASIS (ARGON PLASMA COAGULATION/BICAP);  Surgeon: Carol Ada, MD;  Location: [place];  Service: Endoscopy;  Laterality: N/A;   HOT HEMOSTASIS N/A 02/15/2022   Procedure: HOT HEMOSTASIS (ARGON PLASMA COAGULATION/BICAP);  Surgeon: Sharyn Creamer, MD;  Location: [place];  Service: Gastroenterology;  Laterality: N/A;   INSERT / REPLACE / REMOVE PACEMAKER  05-01-11   02-28-05-/05-04-10-ICD-MEDTRONIC MAXIMAL DR   IR ANGIOGRAM SELECTIVE EACH ADDITIONAL VESSEL  02/18/2022   IR ANGIOGRAM SELECTIVE EACH ADDITIONAL VESSEL  02/18/2022   IR ANGIOGRAM VISCERAL SELECTIVE  02/16/2022   IR ANGIOGRAM VISCERAL SELECTIVE  02/16/2022   IR EMBO ART  VEN HEMORR LYMPH EXTRAV  INC GUIDE ROADMAPPING  02/16/2022   IR THORACENTESIS ASP PLEURAL SPACE W/IMG GUIDE  02/21/2022   IR THORACENTESIS ASP PLEURAL SPACE W/IMG GUIDE  02/22/2022   IR US GUIDE VASC ACCESS RIGHT  02/16/2022   JOINT REPLACEMENT     LAPAROSCOPIC CHOLECYSTECTOMY  11/11/2012   LAPAROSCOPIC LYSIS  OF ADHESIONS N/A 11/11/2012   Procedure: LAPAROSCOPIC LYSIS OF ADHESIONS;  Surgeon: Imogene Burn. Georgette Dover, MD;  Location: [place];  Service: General;  Laterality: N/A;   NEPHRECTOMY Right 06/2001    S/P RENAL CELL CANCER   PERIPHERAL VASCULAR INTERVENTION Bilateral 02/12/2021   Procedure: PERIPHERAL VASCULAR INTERVENTION;  Surgeon: Waynetta Sandy, MD;  Location: [place] [facility];  Service: Cardiovascular;  Laterality: Bilateral;  Iliac artery stents   PRESSURE SENSOR/CARDIOMEMS N/A 02/03/2019   Procedure: PRESSURE SENSOR/CARDIOMEMS;  Surgeon: Larey Dresser, MD;  Location: [place] [facility];  Service: Cardiovascular;  Laterality: N/A;   RIGHT HEART CATH N/A 11/09/2018   Procedure: RIGHT HEART CATH;  Surgeon: Larey Dresser, MD;  Location: [place] [facility];  Service:  Cardiovascular;  Laterality: N/A;   RIGHT HEART CATH N/A 03/08/2019   Procedure: RIGHT HEART CATH;  Surgeon: Larey Dresser, MD;  Location: [place] [facility];  Service: Cardiovascular;  Laterality: N/A;   SUBMUCOSAL TATTOO INJECTION  02/28/2019   Procedure: SUBMUCOSAL TATTOO INJECTION;  Surgeon: Milus Banister, MD;  Location: [facility] ENDOSCOPY;  Service: Endoscopy;;   TOTAL HIP ARTHROPLASTY Right 05/03/2011   Procedure: TOTAL HIP ARTHROPLASTY ANTERIOR APPROACH;  Surgeon: Mcarthur Rossetti;  Location: [facility];  Service: Orthopedics;  Laterality: Right;  Removal of Cannulated Screws Right Hip, Right Direct Anterior Hip Replacement   TOTAL HIP ARTHROPLASTY Left 10/09/2018   Procedure: TOTAL HIP ARTHROPLASTY ANTERIOR APPROACH;  Surgeon: Rod Can, MD;  Location: [place];  Service: Orthopedics;  Laterality: Left;    Current Outpatient Medications  Medication Sig Dispense Refill   acetaminophen (TYLENOL) 500 MG tablet Take 500-1,000 mg by mouth every 8 (eight) hours as needed for mild pain or headache.     apixaban (ELIQUIS) 2.5 MG TABS tablet Take 1 tablet (2.5 mg total) by mouth 2 (two) times daily. 60 tablet 0   Cyanocobalamin (B-12 PO) Take 1 tablet by mouth daily.     dapagliflozin propanediol (FARXIGA) 10 MG TABS tablet Take 1 tablet (10 mg total) by mouth daily before breakfast. 30 tablet 11   fluticasone (FLONASE) 50 MCG/ACT nasal spray Place 2 sprays into both nostrils daily as needed for allergies or rhinitis. 16 g 6   pantoprazole (PROTONIX) 40 MG tablet Take 1 tablet (40 mg total) by mouth daily. 90 tablet 1   potassium chloride SA (KLOR-CON M) 20 MEQ tablet Take 1 tablet (20 mEq total) by mouth daily. 30 tablet 0   rosuvastatin (CRESTOR) 40 MG tablet Take 1 tablet (40 mg total) by mouth daily. 30 tablet 11   torsemide (DEMADEX) 20 MG tablet Take 4 tablets (80 mg total) by mouth daily. 200 tablet 4   vitamin C (ASCORBIC ACID) 250 MG tablet Take 500 mg by mouth daily.     VITAMIN D  PO Take 4,000 Units by mouth daily.     No current facility-administered medications for this visit.    Allergies  Allergen Reactions   Dilaudid [Hydromorphone] Other (See Comments)    Excessive Somnolence- Required Narcan   Fentanyl Other (See Comments)    Required Narcan when given with Dilaudid. Tolerated single dose when given during procedure 02/03/2019.    Codeine Nausea And Vomiting    Hallucinations    Morphine And Related Nausea And Vomiting    Hallucinations    Amoxicillin Nausea Only   Hydrocodone Nausea Only and Other (See Comments)    Nausea and lethargy   Oxycodone Nausea Only and Other (See Comments)  Nausea and lethargy   Tramadol Nausea Only and Other (See Comments)    Nausea and lethargy   Whole Blood Itching and Nausea And Vomiting    Pt and family stated pt will receive blood products but had a reaction requiring benadryl on a previous administration.  Needs premedication with tylenol, benadryl, steroids for future transfusions    Sulfonamide Derivatives Other (See Comments)    Dry mouth    Review of Systems negative except from HPI and PMH  Physical Exam BP (!) 120/42 (BP Location: Left Arm, Patient Position: Sitting, Cuff Size: Normal)   Pulse 60   Ht 5' 6.5" (1.689 m)   Wt 134 lb 4 oz (60.9 kg)   SpO2 97% Comment: 2 Liters of oxygen  BMI 21.34 kg/m  Well developed and well nourished in no acute distress wearing O2 HENT normal Neck supple with JVP-flat Clear Device pocket well healed; without hematoma or erythema.  There is no tethering  Irregularly Irregular rate and rhythm with  controlled  ventricular response  Abd-soft with active BS No Clubbing cyanosis   edema Skin-warm and dry A & Oriented  Grossly normal sensory and motor function  ECG   Device function is normal. Programming changes inactivation of high voltage therapies Activate rate response  Upper sensor rate 100  See Paceart for details    Assessment and  Plan  Nonischemic  cardiomyopathy with interval normalization of LV function  Mitral regurgitation  Pulmonary Hypertension O2 dependent  Complete heart block intermittent  Congestive heart failure  chronic/diastolic class IIb-IIIa  Atrial fibrillation/flutter- permanent   Chronic Renal insufficiency grade 3    Implantable defibrillator  Anemia   Neuroendocrine tumor-pancreas  Atrial fibrillation currently.  No significant bleeding  Lengthy discussion regarding end-of-life issues.  We have inactivated high-voltage therapies and have given her a DNR form; her grandson who is a fireman was partyto the discussions and agreed with the recommendations

## 2022-06-13 NOTE — Patient Instructions (Signed)
Medication Instructions:  Your physician recommends that you continue on your current medications as directed. Please refer to the Current Medication list given to you today.  *If you need a refill on your cardiac medications before your next appointment, please call your pharmacy*   Lab Work: -None ordered If you have labs (blood work) drawn today and your tests are completely normal, you will receive your results only by: MyChart Message (if you have MyChart) OR A paper copy in the mail If you have any lab test that is abnormal or we need to change your treatment, we will call you to review the results.   Testing/Procedures: -None ordered   Follow-Up: At [facility], you and your health needs are our priority.  As part of our continuing mission to provide you with exceptional heart care, we have created designated Provider Care Teams.  These Care Teams include your primary Cardiologist (physician) and Advanced Practice Providers (APPs -  Physician Assistants and Nurse Practitioners) who all work together to provide you with the care you need, when you need it.  We recommend signing up for the patient portal called "MyChart".  Sign up information is provided on this After Visit Summary.  MyChart is used to connect with patients for Virtual Visits (Telemedicine).  Patients are able to view lab/test results, encounter notes, upcoming appointments, etc.  Non-urgent messages can be sent to your provider as well.   To learn more about what you can do with MyChart, go to [email or web address].    Your next appointment:   6 month(s)  Provider:   Steven Klein, MD    Other Instructions -None  

## 2022-06-14 ENCOUNTER — Telehealth: Payer: Self-pay | Admitting: Internal Medicine

## 2022-06-14 NOTE — Telephone Encounter (Signed)
Spoke with niece she stated she had just dropped patient at home, attempted to call home phone, busy signal, LVM on cell phone to call device clinic back, need patient to send a remote manual transmission

## 2022-06-14 NOTE — Telephone Encounter (Signed)
Spoke with patient attempted to get a manual transmission, as unclear exactly what was changed on patients device per SK notes "ICD therapies turned off"  but patient mentioned he increased something else. Patient unable to come to appointment in [place] on 06/17/21 advised patient that without a manual transmission or in office visit unable to assess device changes that may need to be made if any. Advised patient that if symptoms get worse to go to the ED as they can interrogate device there and call and [employer]T representative to make changes if needed. Patient stated she would call back on Monday if symptoms did not improve over the weekend

## 2022-06-14 NOTE — Telephone Encounter (Signed)
STAT if patient feels like he/she is going to faint   Are you dizzy now? Yes   Do you feel faint or have you passed out? No   Do you have any other symptoms? Having mild SOB   Have you checked your HR and BP (record if available)? No  Pt states that her pacemaker was adjusted and her defibrillator was turned off at her appt 02/08 and today she is feeling dizzy and having slight SOB. Niece states that she isn't to the point of needing to go to ED, but would like to know if whatever adjustments were made can be put back to the way it was previous to the changes. Requesting call back.

## 2022-06-17 ENCOUNTER — Telehealth: Payer: Self-pay | Admitting: Internal Medicine

## 2022-06-17 ENCOUNTER — Ambulatory Visit: Payer: PPO | Attending: Internal Medicine

## 2022-06-17 ENCOUNTER — Encounter: Payer: PPO | Admitting: Internal Medicine

## 2022-06-17 DIAGNOSIS — I428 Other cardiomyopathies: Secondary | ICD-10-CM

## 2022-06-17 LAB — CUP PACEART INCLINIC DEVICE CHECK
Date Time Interrogation Session: 20240212161549
Implantable Lead Connection Status: 753985
Implantable Lead Connection Status: 753985
Implantable Lead Implant Date: 20061026
Implantable Lead Implant Date: 20111230
Implantable Lead Location: 753859
Implantable Lead Location: 753860
Implantable Lead Model: 5076
Implantable Lead Model: 6947
Implantable Pulse Generator Implant Date: 20171018

## 2022-06-17 NOTE — Progress Notes (Signed)
Pt seen in clinic d/t ICD alarming and to reprogram from VVIR to VVI 60.  States that rate response being on made her "feel anxious". Her ICD was alarming because her ICD therapies had been turned off, but alerts for therapies was still on.  Turned off alerts with MDT rep.  Advised ICD should no longer alarm.

## 2022-06-17 NOTE — Telephone Encounter (Signed)
  1. Has your device fired? Yes, off and on.   2. Is you device beeping? No   3. Are you experiencing draining or swelling at device site? No   4. Are you calling to see if we received your device transmission? No  5. Have you passed out? No   Patient said her device was adjusted recently and it has been acting up all weekend and she requesting return call.     Please route to Device Clinic Pool  Pt states that she would like a call back to discuss her defibrillator going off all weekend.

## 2022-06-17 NOTE — Telephone Encounter (Signed)
The patient states her monitor is not working. I ordered her a new one and she should receive it in 7-10 business days.   The patient agreed to come into the office today around 3:30 pm to be seen.

## 2022-06-19 DIAGNOSIS — Z4789 Encounter for other orthopedic aftercare: Secondary | ICD-10-CM | POA: Diagnosis not present

## 2022-06-19 DIAGNOSIS — I5033 Acute on chronic diastolic (congestive) heart failure: Secondary | ICD-10-CM | POA: Diagnosis not present

## 2022-06-19 DIAGNOSIS — M6281 Muscle weakness (generalized): Secondary | ICD-10-CM | POA: Diagnosis not present

## 2022-06-19 DIAGNOSIS — J90 Pleural effusion, not elsewhere classified: Secondary | ICD-10-CM | POA: Diagnosis not present

## 2022-06-19 DIAGNOSIS — J449 Chronic obstructive pulmonary disease, unspecified: Secondary | ICD-10-CM | POA: Diagnosis not present

## 2022-06-19 DIAGNOSIS — R2689 Other abnormalities of gait and mobility: Secondary | ICD-10-CM | POA: Diagnosis not present

## 2022-06-26 ENCOUNTER — Inpatient Hospital Stay: Payer: PPO

## 2022-06-26 ENCOUNTER — Inpatient Hospital Stay: Payer: PPO | Admitting: Nurse Practitioner

## 2022-07-01 ENCOUNTER — Ambulatory Visit (INDEPENDENT_AMBULATORY_CARE_PROVIDER_SITE_OTHER): Payer: PPO | Admitting: Dermatology

## 2022-07-01 VITALS — BP 149/70 | HR 60

## 2022-07-01 DIAGNOSIS — C44529 Squamous cell carcinoma of skin of other part of trunk: Secondary | ICD-10-CM

## 2022-07-01 DIAGNOSIS — Z85828 Personal history of other malignant neoplasm of skin: Secondary | ICD-10-CM | POA: Diagnosis not present

## 2022-07-01 DIAGNOSIS — C44729 Squamous cell carcinoma of skin of left lower limb, including hip: Secondary | ICD-10-CM

## 2022-07-01 DIAGNOSIS — L720 Epidermal cyst: Secondary | ICD-10-CM

## 2022-07-01 DIAGNOSIS — L578 Other skin changes due to chronic exposure to nonionizing radiation: Secondary | ICD-10-CM | POA: Diagnosis not present

## 2022-07-01 DIAGNOSIS — D045 Carcinoma in situ of skin of trunk: Secondary | ICD-10-CM | POA: Diagnosis not present

## 2022-07-01 DIAGNOSIS — D492 Neoplasm of unspecified behavior of bone, soft tissue, and skin: Secondary | ICD-10-CM

## 2022-07-01 DIAGNOSIS — C4492 Squamous cell carcinoma of skin, unspecified: Secondary | ICD-10-CM

## 2022-07-01 NOTE — Patient Instructions (Addendum)
Wound Care Instructions  Cleanse wound gently with soap and water once a day then pat dry with clean gauze. Apply a thin coat of Petrolatum (petroleum jelly, "Vaseline") over the wound (unless you have an allergy to this). We recommend that you use a new, sterile tube of Vaseline. Do not pick or remove scabs. Do not remove the yellow or white "healing tissue" from the base of the wound.  Cover the wound with fresh, clean, nonstick gauze and secure with paper tape. You may use Band-Aids in place of gauze and tape if the wound is small enough, but would recommend trimming much of the tape off as there is often too much. Sometimes Band-Aids can irritate the skin.  You should call the office for your biopsy report after 1 week if you have not already been contacted.  If you experience any problems, such as abnormal amounts of bleeding, swelling, significant bruising, significant pain, or evidence of infection, please call the office immediately.  FOR ADULT SURGERY PATIENTS: If you need something for pain relief you may take 1 extra strength Tylenol (acetaminophen) AND 2 Ibuprofen (200mg each) together every 4 hours as needed for pain. (do not take these if you are allergic to them or if you have a reason you should not take them.) Typically, you may only need pain medication for 1 to 3 days.     Due to recent changes in healthcare laws, you may see results of your pathology and/or laboratory studies on MyChart before the doctors have had a chance to review them. We understand that in some cases there may be results that are confusing or concerning to you. Please understand that not all results are received at the same time and often the doctors may need to interpret multiple results in order to provide you with the best plan of care or course of treatment. Therefore, we ask that you please give us 2 business days to thoroughly review all your results before contacting the office for clarification. Should  we see a critical lab result, you will be contacted sooner.   If You Need Anything After Your Visit  If you have any questions or concerns for your doctor, please call our main line at [phone] and press option 4 to reach your doctor's medical assistant. If no one answers, please leave a voicemail as directed and we will return your call as soon as possible. Messages left after 4 pm will be answered the following business day.   You may also send us a message via MyChart. We typically respond to MyChart messages within 1-2 business days.  For prescription refills, please ask your pharmacy to contact our office. Our fax number is [phone].  If you have an urgent issue when the clinic is closed that cannot wait until the next business day, you can page your doctor at the number below.    Please note that while we do our best to be available for urgent issues outside of office hours, we are not available 24/7.   If you have an urgent issue and are unable to reach us, you may choose to seek medical care at your doctor's office, retail clinic, urgent care center, or emergency room.  If you have a medical emergency, please immediately call 911 or go to the emergency department.  Pager Numbers  - Dr. Kowalski: [phone]  - Dr. Moye: [phone]  - Dr. Stewart: [phone]  In the event of inclement weather, please call our main line at   [phone] for an update on the status of any delays or closures.  Dermatology Medication Tips: Please keep the boxes that topical medications come in in order to help keep track of the instructions about where and how to use these. Pharmacies typically print the medication instructions only on the boxes and not directly on the medication tubes.   If your medication is too expensive, please contact our office at [phone] option 4 or send us a message through MyChart.   We are unable to tell what your co-pay for medications will be in  advance as this is different depending on your insurance coverage. However, we may be able to find a substitute medication at lower cost or fill out paperwork to get insurance to cover a needed medication.   If a prior authorization is required to get your medication covered by your insurance company, please allow us 1-2 business days to complete this process.  Drug prices often vary depending on where the prescription is filled and some pharmacies may offer cheaper prices.  The website www.goodrx.com contains coupons for medications through different pharmacies. The prices here do not account for what the cost may be with help from insurance (it may be cheaper with your insurance), but the website can give you the price if you did not use any insurance.  - You can print the associated coupon and take it with your prescription to the pharmacy.  - You may also stop by our office during regular business hours and pick up a GoodRx coupon card.  - If you need your prescription sent electronically to a different pharmacy, notify our office through [place] MyChart or by phone at [phone] option 4.     Si Usted Necesita Algo Despus de Su Visita  Tambin puede enviarnos un mensaje a travs de MyChart. Por lo general respondemos a los mensajes de MyChart en el transcurso de 1 a 2 das hbiles.  Para renovar recetas, por favor pida a su farmacia que se ponga en contacto con nuestra oficina. Nuestro nmero de fax es el [phone].  Si tiene un asunto urgente cuando la clnica est cerrada y que no puede esperar hasta el siguiente da hbil, puede llamar/localizar a su doctor(a) al nmero que aparece a continuacin.   Por favor, tenga en cuenta que aunque hacemos todo lo posible para estar disponibles para asuntos urgentes fuera del horario de oficina, no estamos disponibles las 24 horas del da, los 7 das de la semana.   Si tiene un problema urgente y no puede comunicarse con nosotros, puede  optar por buscar atencin mdica  en el consultorio de su doctor(a), en una clnica privada, en un centro de atencin urgente o en una sala de emergencias.  Si tiene una emergencia mdica, por favor llame inmediatamente al 911 o vaya a la sala de emergencias.  Nmeros de bper  - Dr. Kowalski: [phone]  - Dra. Moye: [phone]  - Dra. Stewart: [phone]  En caso de inclemencias del tiempo, por favor llame a nuestra lnea principal al [phone] para una actualizacin sobre el estado de cualquier retraso o cierre.  Consejos para la medicacin en dermatologa: Por favor, guarde las cajas en las que vienen los medicamentos de uso tpico para ayudarle a seguir las instrucciones sobre dnde y cmo usarlos. Las farmacias generalmente imprimen las instrucciones del medicamento slo en las cajas y no directamente en los tubos del medicamento.   Si su medicamento es muy caro, por favor, pngase en contacto con   nuestra oficina llamando al [phone] y presione la opcin 4 o envenos un mensaje a travs de MyChart.   No podemos decirle cul ser su copago por los medicamentos por adelantado ya que esto es diferente dependiendo de la cobertura de su seguro. Sin embargo, es posible que podamos encontrar un medicamento sustituto a menor costo o llenar un formulario para que el seguro cubra el medicamento que se considera necesario.   Si se requiere una autorizacin previa para que su compaa de seguros cubra su medicamento, por favor permtanos de 1 a 2 das hbiles para completar este proceso.  Los precios de los medicamentos varan con frecuencia dependiendo del lugar de dnde se surte la receta y alguna farmacias pueden ofrecer precios ms baratos.  El sitio web www.goodrx.com tiene cupones para medicamentos de diferentes farmacias. Los precios aqu no tienen en cuenta lo que podra costar con la ayuda del seguro (puede ser ms barato con su seguro), pero el sitio web puede darle el  precio si no utiliz ningn seguro.  - Puede imprimir el cupn correspondiente y llevarlo con su receta a la farmacia.  - Tambin puede pasar por nuestra oficina durante el horario de atencin regular y recoger una tarjeta de cupones de GoodRx.  - Si necesita que su receta se enve electrnicamente a una farmacia diferente, informe a nuestra oficina a travs de MyChart de [place] o por telfono llamando al [phone] y presione la opcin 4.  

## 2022-07-01 NOTE — Progress Notes (Signed)
Follow-Up Visit   Subjective  [name] is a 89 y.o. female who presents for the following: check spots (Abdomen, been there 75m started looking bad and sore ~1175mgo/Chest, ~75m475mtchy, hx of bleeding). H/o multiple SCCs.  Patient accompanied by daughter.  The following portions of the chart were reviewed this encounter and updated as appropriate:       Review of Systems:  No other skin or systemic complaints except as noted in HPI or Assessment and Plan.  Objective  Well appearing patient in no apparent distress; mood and affect are within normal limits.  A focused examination was performed including abdomen, chest. Relevant physical exam findings are noted in the Assessment and Plan.  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 Anesthesia: the lesion was anesthetized in a standard fashion   Anesthetic:  1% lidocaine w/ epinephrine 1-100,000 buffered w/ 8.4% NaHCO3 Instrument used: flexible razor blade   Hemostasis achieved with: pressure, aluminum chloride and electrodesiccation   Outcome: patient tolerated procedure well    Destruction of lesion  Destruction method: electrodesiccation and curettage   Informed consent: discussed and consent obtained   Curettage performed in three  different directions: Yes   Electrodesiccation performed over the curetted area: Yes   Lesion length (cm):  1.1 Lesion width (cm):  1.1 Margin per side (cm):  0.2 Final wound size (cm):  1.5 Hemostasis achieved with:  pressure, aluminum chloride and electrodesiccation Outcome: patient tolerated procedure well with no complications   Post-procedure details: wound care instructions given   Additional details:  Mupirocin ointment and Bandaid applied   Specimen 1 - Surgical pathology Differential Diagnosis: R/O SCC, BCC  Check Margins: yes 1.1cm pink ulcerated thin pap EDC today  L medial chest  Epidermal / dermal shaving  Lesion diameter (cm):  0.6 Informed consent: discussed and consent obtained   Patient was prepped and draped in usual sterile fashion: area prepped with alcohol. Anesthesia: the lesion was anesthetized in a standard fashion   Anesthetic:  1% lidocaine w/ epinephrine 1-100,000 buffered w/ 8.4% NaHCO3 Instrument used: flexible razor blade   Hemostasis achieved with: pressure, aluminum chloride and electrodesiccation   Outcome: patient tolerated procedure well    Destruction of lesion  Destruction method: electrodesiccation and curettage   Informed consent: discussed and consent obtained   Curettage performed in three different directions: Yes   Electrodesiccation performed over the curetted area: Yes   Lesion length (cm):  0.6 Lesion width (cm):  0.6 Margin per side (cm):  0.1 Final wound size (cm):  0.8 Hemostasis achieved with:  pressure, aluminum chloride and electrodesiccation Outcome: patient tolerated procedure well with no complications   Post-procedure details: wound care instructions given   Additional details:  Mupirocin ointment  and Bandaid applied   Specimen 2 - Surgical pathology Differential Diagnosis: D48.5 R/O SCC, BCC  Check Margins: yes 6.66m pink ulcerated macule EDC today  Epidermal cyst Central lower abdomen  Benign-appearing.  Exam most consistent with an epidermal inclusion cyst. Discussed that a cyst is a benign growth that can grow over time and sometimes get irritated or inflamed. Recommend observation if it is not bothersome. Discussed option of surgical excision to remove it if it is growing, symptomatic, or other changes noted. Please call for new or changing lesions so they can be evaluated.    Actinic Damage - chronic, secondary to cumulative UV radiation exposure/sun exposure over time - diffuse scaly erythematous macules with underlying dyspigmentation - Recommend daily broad spectrum sunscreen SPF 30+ to sun-exposed areas, reapply every 2 hours as needed.  - Recommend staying in the shade or wearing long sleeves, sun glasses (UVA+UVB protection) and wide brim hats (4-inch brim around the entire circumference of the hat). - Call for new or changing lesions.   History of Squamous Cell Carcinoma of the Skin - No evidence of recurrence today - Recommend regular full body skin exams - Recommend daily broad spectrum sunscreen SPF 30+ to sun-exposed areas, reapply every 2 hours as needed.  - Call if any new or changing lesions are noted between office visits    Return in about 4 weeks (around 07/29/2022) for check some other areas.  I, SOthelia Pulling RMA, am acting as scribe for TBrendolyn Patty MD .  Documentation: I have reviewed the above documentation for accuracy and completeness, and I agree with the above.  TBrendolyn PattyMD

## 2022-07-04 ENCOUNTER — Other Ambulatory Visit: Payer: Self-pay

## 2022-07-04 DIAGNOSIS — M6281 Muscle weakness (generalized): Secondary | ICD-10-CM | POA: Diagnosis not present

## 2022-07-04 DIAGNOSIS — J90 Pleural effusion, not elsewhere classified: Secondary | ICD-10-CM | POA: Diagnosis not present

## 2022-07-04 DIAGNOSIS — Z4789 Encounter for other orthopedic aftercare: Secondary | ICD-10-CM | POA: Diagnosis not present

## 2022-07-04 DIAGNOSIS — R2689 Other abnormalities of gait and mobility: Secondary | ICD-10-CM | POA: Diagnosis not present

## 2022-07-04 DIAGNOSIS — J449 Chronic obstructive pulmonary disease, unspecified: Secondary | ICD-10-CM | POA: Diagnosis not present

## 2022-07-04 DIAGNOSIS — H6992 Unspecified Eustachian tube disorder, left ear: Secondary | ICD-10-CM

## 2022-07-04 DIAGNOSIS — I5033 Acute on chronic diastolic (congestive) heart failure: Secondary | ICD-10-CM | POA: Diagnosis not present

## 2022-07-04 DIAGNOSIS — K8689 Other specified diseases of pancreas: Secondary | ICD-10-CM | POA: Diagnosis not present

## 2022-07-04 DIAGNOSIS — R0981 Nasal congestion: Secondary | ICD-10-CM

## 2022-07-04 MED ORDER — FLUTICASONE PROPIONATE 50 MCG/ACT NA SUSP: 2.0000 | Freq: Every day | NASAL | 0 refills | Status: DC | PRN

## 2022-07-04 NOTE — Telephone Encounter (Signed)
Refill left on vm at pharmacy.  

## 2022-07-08 ENCOUNTER — Encounter (HOSPITAL_COMMUNITY): Payer: PPO

## 2022-07-08 ENCOUNTER — Telehealth: Payer: Self-pay

## 2022-07-08 NOTE — Telephone Encounter (Signed)
-----   Message from Brendolyn Patty, MD sent at 07/08/2022 12:45 PM EST ----- 1. Skin (M), left lower abdomen SQUAMOUS CELL CARCINOMA IN SITU, BASE INVOLVED 2. Skin (M), left medial chest WELL DIFFERENTIATED SQUAMOUS CELL CARCINOMA, BASE INVOLVED  1. SCCIS skin cancer- already treated with EDC at time of biopsy 2. SCC skin cancer- already treated with EDC at time of biopsy    - please call patient

## 2022-07-08 NOTE — Telephone Encounter (Signed)
Advised patient of biopsy results left lower abdomen SQUAMOUS CELL CARCINOMA IN SITU, BASE INVOLVED and  left medial chest WELL DIFFERENTIATED SQUAMOUS CELL CARCINOMA, BASE INVOLVED. Both were treated with EDC at time of biopsy.

## 2022-07-09 ENCOUNTER — Inpatient Hospital Stay: Payer: PPO

## 2022-07-09 ENCOUNTER — Inpatient Hospital Stay (HOSPITAL_BASED_OUTPATIENT_CLINIC_OR_DEPARTMENT_OTHER): Payer: PPO | Admitting: Nurse Practitioner

## 2022-07-09 ENCOUNTER — Inpatient Hospital Stay: Payer: PPO | Attending: Nurse Practitioner

## 2022-07-09 ENCOUNTER — Other Ambulatory Visit: Payer: Self-pay

## 2022-07-09 ENCOUNTER — Encounter: Payer: Self-pay | Admitting: Nurse Practitioner

## 2022-07-09 VITALS — BP 154/54 | HR 60

## 2022-07-09 VITALS — BP 135/54 | HR 59 | Temp 98.3°F | Resp 18 | Ht 66.5 in | Wt 137.9 lb

## 2022-07-09 DIAGNOSIS — D3A8 Other benign neuroendocrine tumors: Secondary | ICD-10-CM | POA: Insufficient documentation

## 2022-07-09 DIAGNOSIS — D649 Anemia, unspecified: Secondary | ICD-10-CM

## 2022-07-09 DIAGNOSIS — Z79899 Other long term (current) drug therapy: Secondary | ICD-10-CM | POA: Diagnosis not present

## 2022-07-09 LAB — CBC WITH DIFFERENTIAL (CANCER CENTER ONLY)
Abs Immature Granulocytes: 0.01 10*3/uL (ref 0.00–0.07)
Basophils Absolute: 0 10*3/uL (ref 0.0–0.1)
Basophils Relative: 1 %
Eosinophils Absolute: 0 10*3/uL (ref 0.0–0.5)
Eosinophils Relative: 0 %
HCT: 33.1 % — ABNORMAL LOW (ref 36.0–46.0)
Hemoglobin: 10.5 g/dL — ABNORMAL LOW (ref 12.0–15.0)
Immature Granulocytes: 0 %
Lymphocytes Relative: 18 %
Lymphs Abs: 0.6 10*3/uL — ABNORMAL LOW (ref 0.7–4.0)
MCH: 27.5 pg (ref 26.0–34.0)
MCHC: 31.7 g/dL (ref 30.0–36.0)
MCV: 86.6 fL (ref 80.0–100.0)
Monocytes Absolute: 0.4 10*3/uL (ref 0.1–1.0)
Monocytes Relative: 12 %
Neutro Abs: 2.3 10*3/uL (ref 1.7–7.7)
Neutrophils Relative %: 69 %
Platelet Count: 153 10*3/uL (ref 150–400)
RBC: 3.82 MIL/uL — ABNORMAL LOW (ref 3.87–5.11)
RDW: 16.6 % — ABNORMAL HIGH (ref 11.5–15.5)
WBC Count: 3.3 10*3/uL — ABNORMAL LOW (ref 4.0–10.5)
nRBC: 0 % (ref 0.0–0.2)

## 2022-07-09 LAB — CMP (CANCER CENTER ONLY)
ALT: 6 U/L (ref 0–44)
AST: 16 U/L (ref 15–41)
Albumin: 4.4 g/dL (ref 3.5–5.0)
Alkaline Phosphatase: 54 U/L (ref 38–126)
Anion gap: 6 (ref 5–15)
BUN: 41 mg/dL — ABNORMAL HIGH (ref 8–23)
CO2: 35 mmol/L — ABNORMAL HIGH (ref 22–32)
Calcium: 9.8 mg/dL (ref 8.9–10.3)
Chloride: 98 mmol/L (ref 98–111)
Creatinine: 1.58 mg/dL — ABNORMAL HIGH (ref 0.44–1.00)
GFR, Estimated: 32 mL/min — ABNORMAL LOW (ref >60)
Glucose, Bld: 100 mg/dL — ABNORMAL HIGH (ref 70–99)
Potassium: 4.8 mmol/L (ref 3.5–5.1)
Sodium: 139 mmol/L (ref 135–145)
Total Bilirubin: 0.5 mg/dL (ref 0.3–1.2)
Total Protein: 7.7 g/dL (ref 6.5–8.1)

## 2022-07-09 MED ORDER — OCTREOTIDE ACETATE 20 MG IM KIT
20.0000 mg | PACK | Freq: Once | INTRAMUSCULAR | Status: AC
  Administered 2022-07-09: 20 mg via INTRAMUSCULAR
  Filled 2022-07-09: qty 1

## 2022-07-09 NOTE — Patient Instructions (Signed)
Octreotide Injection Solution What is this medication? OCTREOTIDE (ok TREE oh tide) treats high levels of growth hormone (acromegaly). It works by reducing the amount of growth hormone your body makes. This reduces symptoms and the risk of health problems caused by too much growth hormone, such as diabetes and heart disease. It may also be used to treat diarrhea caused by neuroendocrine tumors. It works by slowing down the release of serotonin from the tumor cells. This reduces the number of bowel movements you have. This medicine may be used for other purposes; ask your health care provider or pharmacist if you have questions. COMMON BRAND NAME(S): Bynfezia, Sandostatin What should I tell my care team before I take this medication? They need to know if you have any of these conditions: Diabetes Gallbladder disease Kidney disease Liver disease Thyroid disease An unusual or allergic reaction to octreotide, other medications, foods, dyes, or preservatives Pregnant or trying to get pregnant Breast-feeding How should I use this medication? This medication is injected under the skin or into a vein. It is usually given by your care team in a hospital or clinic setting. If you get this medication at home, you will be taught how to prepare and give it. Use exactly as directed. Take it as directed on the prescription label at the same time every day. Keep taking it unless your care team tells you to stop. Allow the injection solution to come to room temperature before use. Do not warm it artificially. It is important that you put your used needles and syringes in a special sharps container. Do not put them in a trash can. If you do not have a sharps container, call your pharmacist or care team to get one. Talk to your care team about the use of this medication in children. Special care may be needed. Overdosage: If you think you have taken too much of this medicine contact a poison control center or  emergency room at once. NOTE: This medicine is only for you. Do not share this medicine with others. What if I miss a dose? If you miss a dose, take it as soon as you can. If it is almost time for your next dose, take only that dose. Do not take double or extra doses. What may interact with this medication? Bromocriptine Certain medications for blood pressure, heart disease, irregular heartbeat Cyclosporine Diuretics Medications for diabetes, including insulin Quinidine This list may not describe all possible interactions. Give your health care provider a list of all the medicines, herbs, non-prescription drugs, or dietary supplements you use. Also tell them if you smoke, drink alcohol, or use illegal drugs. Some items may interact with your medicine. What should I watch for while using this medication? Visit your care team for regular checks on your progress. Tell your care team if your symptoms do not start to get better or if they get worse. To help reduce irritation at the injection site, use a different site for each injection and make sure the solution is at room temperature before use. This medication may cause decreases in blood sugar. Signs of low blood sugar include chills, cool, pale skin or cold sweats, drowsiness, extreme hunger, fast heartbeat, headache, nausea, nervousness or anxiety, shakiness, trembling, unsteadiness, tiredness, or weakness. Contact your care team right away if you experience any of these symptoms. This medication may increase blood sugar. The risk may be higher in patients who already have diabetes. Ask your care team what you can do to lower your   risk of diabetes while taking this medication. You should make sure you get enough vitamin B12 while you are taking this medication. Discuss the foods you eat and the vitamins you take with your care team. What side effects may I notice from receiving this medication? Side effects that you should report to your care  team as soon as possible: Allergic reactions--skin rash, itching, hives, swelling of the face, lips, tongue, or throat Gallbladder problems--severe stomach pain, nausea, vomiting, fever Heart rhythm changes--fast or irregular heartbeat, dizziness, feeling faint or lightheaded, chest pain, trouble breathing High blood sugar (hyperglycemia)--increased thirst or amount of urine, unusual weakness or fatigue, blurry vision Low blood sugar (hypoglycemia)--tremors or shaking, anxiety, sweating, cold or clammy skin, confusion, dizziness, rapid heartbeat Low thyroid levels (hypothyroidism)--unusual weakness or fatigue, increased sensitivity to cold, constipation, hair loss, dry skin, weight gain, feelings of depression Low vitamin B12 level--pain, tingling, or numbness in the hands or feet, muscle weakness, dizziness, confusion, trouble concentrating Pancreatitis--severe stomach pain that spreads to your back or gets worse after eating or when touched, fever, nausea, vomiting Side effects that usually do not require medical attention (report to your care team if they continue or are bothersome): Diarrhea Dizziness Gas Headache Pain, redness, or irritation at injection site Stomach pain This list may not describe all possible side effects. Call your doctor for medical advice about side effects. You may report side effects to FDA at 1-800-FDA-1088. Where should I keep my medication? Keep out of the reach of children and pets. Store in the refrigerator. Protect from light. Allow to come to room temperature naturally. Do not use artificial heat. If protected from light, the injection may be stored between 20 and 30 degrees C (70 and 86 degrees F) for 14 days. After the initial use, throw away any unused portion of a multiple dose vial after 14 days. Get rid of any unused portions of the ampules after use. To get rid of medications that are no longer needed or have expired: Take the medication to a medication  take-back program. Ask your pharmacy or law enforcement to find a location. If you cannot return the medication, ask your pharmacist or care team how to get rid of the medication safely. NOTE: This sheet is a summary. It may not cover all possible information. If you have questions about this medicine, talk to your doctor, pharmacist, or health care provider.  2023 Elsevier/Gold Standard (2021-07-25 00:00:00)  

## 2022-07-09 NOTE — Progress Notes (Signed)
Patient Care Team: Ria Bush, MD as PCP - General (Family Medicine) Deboraha Sprang, MD as PCP - Cardiology (Cardiology) Larey Dresser, MD as PCP - Advanced Heart Failure (Cardiology) Minna Merritts, MD as Consulting Physician (Cardiology) Deboraha Sprang, MD as Consulting Physician (Cardiology) Delana Meyer, Dolores Lory, MD as Consulting Physician (Vascular Surgery) Sindy Guadeloupe, MD as Consulting Physician (Hematology and Oncology) Truitt Merle, MD as Consulting Physician (Oncology) Charlton Haws, [facility] as Pharmacist (Pharmacist)   CHIEF COMPLAINT: Follow up NET of the pancreas  CURRENT THERAPY: First line Sandostatin injection q28 days, starting 03/27/22  INTERVAL HISTORY [name] returns for follow up as scheduled. Last seen by me 05/27/22 and completed 3rd injection.  Missed a visit in February for family illness who could not bring her here.  She has been doing fairly well in the interim except more dizziness recently.  She saw cardiology in February who increased diuretic.  She thinks she is drinking enough.  Dizziness occurs with sitting and occasionally with standing, she has had this in the past.  Continues to notice hair loss but also regrowth, taking a hair vitamin.  She has intermittent diarrhea twice a month which is better since starting Sandostatin, flashes/flushing also resolved on injections.  Denies any other new specific complaints.  ROS  All other systems reviewed and negative  Past Medical History:  Diagnosis Date   Actinic keratosis 01/17/2015   R forearm   Adjustment disorder with anxiety    Anemia    Anginal pain ([place])    Arthritis    "some in my hands" (11/11/2012)   Atrial fibrillation (HCC)    Atypical mole 03/25/2018   L forearm - severe   Automatic implantable cardioverter-defibrillator in situ    Avascular necrosis of hip ([place]) 05/03/2011   Blood in stool    Carotid artery stenosis 09/2007   a. 09/2007: 60-79% bilateral (stable); b.  10/2008: 40-59% R 60-79%    Cellulitis of left lower extremity 07/13/2020   Chest pain, unspecified    Chronic airway obstruction, not elsewhere classified    Chronic bronchitis ([place])    "get it some; not q year" (11/11/2012)   Chronic diastolic CHF (congestive heart failure) ([place]) 06/04/2013   Chronic kidney disease, unspecified    Coronary artery disease    non-obstructive by 2006 cath   COVID-19 virus infection 02/12/2022   Displaced fracture of left femoral neck ([place]) 10/09/2018   Frequent UTI    "get them a couple times/yr" (11/11/2012)   GI bleed 03/28/2020   Hemorrhage of rectum and anus    High cholesterol    History of blood transfusion 04/2011   "after hip OR" (11/11/2012)   Hx of renal cell cancer    Hypertension 05/20/2011   Hypertr obst cardiomyop    Hypotension, unspecified    cardiac cath 2006..nonobstructive CAD 30-40s lesions.Marland KitchenETT 1/09 nondiagnostic due to poor HR response..Right Renal Cancer 2003   Long term (current) use of anticoagulants    Malignant neoplasm of kidney, except pelvis    Osteoarthritis of right hip    Other and unspecified coagulation defects    PONV (postoperative nausea and vomiting)    Presence of permanent cardiac pacemaker    RECTAL BLEEDING 10/13/2009   Qualifier: Diagnosis of  By: Chester Holstein NP, Paula     Renal cancer [facility]) 06/2001   Right   Secondary cardiomyopathy, unspecified    Sinus bradycardia    Squamous cell carcinoma of skin 01/17/2015  R lat wrist   Squamous cell carcinoma of skin 01/29/2018   R post upper leg - superficially invasive   Squamous cell carcinoma of skin 03/25/2018   L lat foot   Squamous cell carcinoma of skin 11/09/2020   left lat foot - EDC 01/01/21, recurrent 01/31/21 - MOHs 02/22/21   Squamous cell carcinoma of skin 12/19/2021   Right Posterior Medial Thigh, EDC   Squamous cell carcinoma of skin 12/19/2021   SCC IS, L lat heel, EDC 02/04/2022   Squamous cell carcinoma of skin 01/21/2022   L forearm, EDC  02/04/2022   Squamous cell carcinoma of skin 01/21/2022   R lower leg below knee, EDC 02/04/2022   Squamous cell carcinoma of skin 01/21/2022   SCCIS, R post heel, EDC 02/04/2022   Squamous cell carcinoma of skin 07/01/2022   left lower abdomen, in situ, EDC   Squamous cell carcinoma of skin 07/01/2022   left medial chest, EDC   Urge incontinence      Past Surgical History:  Procedure Laterality Date   ABDOMINAL AORTOGRAM W/LOWER EXTREMITY N/A 02/12/2021   Procedure: ABDOMINAL AORTOGRAM W/LOWER EXTREMITY;  Surgeon: Waynetta Sandy, MD;  Location: [place] CV LAB;  Service: Cardiovascular;  Laterality: N/A;   ABDOMINAL HYSTERECTOMY  1975   for benign causes   APPENDECTOMY     BI-VENTRICULAR PACEMAKER UPGRADE  05/04/2010   BIOPSY  02/28/2019   Procedure: BIOPSY;  Surgeon: Milus Banister, MD;  Location: [facility] ENDOSCOPY;  Service: Endoscopy;;   BIOPSY  03/04/2022   Procedure: BIOPSY;  Surgeon: Irving Copas., MD;  Location: [facility];  Service: Gastroenterology;;   CARDIAC CATHETERIZATION  2006   CARDIOVERSION N/A 02/20/2018   Procedure: CARDIOVERSION;  Surgeon: Minna Merritts, MD;  Location: [facility];  Service: Cardiovascular;  Laterality: N/A;   CARDIOVERSION N/A 03/27/2018   Procedure: CARDIOVERSION;  Surgeon: Minna Merritts, MD;  Location: [facility] ORS;  Service: Cardiovascular;  Laterality: N/A;   CATARACT EXTRACTION W/ INTRAOCULAR LENS  IMPLANT, BILATERAL  01/2006-02-2006   CHOLECYSTECTOMY N/A 11/11/2012   Procedure: LAPAROSCOPIC CHOLECYSTECTOMY WITH INTRAOPERATIVE CHOLANGIOGRAM;  Surgeon: Imogene Burn. Georgette Dover, MD;  Location: [place];  Service: General;  Laterality: N/A;   COLONOSCOPY WITH PROPOFOL N/A 02/28/2019   Procedure: COLONOSCOPY WITH PROPOFOL;  Surgeon: Milus Banister, MD;  Location: [facility] ENDOSCOPY;  Service: Endoscopy;  Laterality: N/A;   ENTEROSCOPY N/A 03/30/2020   Procedure: ENTEROSCOPY;  Surgeon: Lavena Bullion, DO;  Location: [place];   Service: Gastroenterology;  Laterality: N/A;   ENTEROSCOPY N/A 02/10/2021   Procedure: ENTEROSCOPY;  Surgeon: Carol Ada, MD;  Location: [place];  Service: Endoscopy;  Laterality: N/A;   ENTEROSCOPY N/A 02/15/2022   Procedure: ENTEROSCOPY;  Surgeon: Sharyn Creamer, MD;  Location: [facility] ENDOSCOPY;  Service: Gastroenterology;  Laterality: N/A;   EP IMPLANTABLE DEVICE N/A 02/21/2016   Procedure: ICD Generator Changeout;  Surgeon: Deboraha Sprang, MD;  Location: [place] CV LAB;  Service: Cardiovascular;  Laterality: N/A;   ESOPHAGOGASTRODUODENOSCOPY N/A 03/04/2022   Procedure: ESOPHAGOGASTRODUODENOSCOPY (EGD);  Surgeon: Irving Copas., MD;  Location: [facility] ENDOSCOPY;  Service: Gastroenterology;  Laterality: N/A;   ESOPHAGOGASTRODUODENOSCOPY (EGD) WITH PROPOFOL N/A 02/28/2019   Procedure: ESOPHAGOGASTRODUODENOSCOPY (EGD) WITH PROPOFOL;  Surgeon: Milus Banister, MD;  Location: [facility] ENDOSCOPY;  Service: Endoscopy;  Laterality: N/A;   EUS N/A 03/04/2022   Procedure: UPPER ENDOSCOPIC ULTRASOUND (EUS) RADIAL;  Surgeon: Irving Copas., MD;  Location: [facility] ENDOSCOPY;  Service: Gastroenterology;  Laterality: N/A;  FINE NEEDLE ASPIRATION N/A 03/04/2022   Procedure: FINE NEEDLE ASPIRATION (FNA) LINEAR;  Surgeon: Irving Copas., MD;  Location: [facility] ENDOSCOPY;  Service: Gastroenterology;  Laterality: N/A;   GIVENS CAPSULE STUDY N/A 03/15/2019   Procedure: GIVENS CAPSULE STUDY;  Surgeon: Thornton Park, MD;  Location: [place];  Service: Gastroenterology;  Laterality: N/A;   HOT HEMOSTASIS N/A 03/30/2020   Procedure: HOT HEMOSTASIS (ARGON PLASMA COAGULATION/BICAP);  Surgeon: Lavena Bullion, DO;  Location: [facility] ENDOSCOPY;  Service: Gastroenterology;  Laterality: N/A;   HOT HEMOSTASIS N/A 02/10/2021   Procedure: HOT HEMOSTASIS (ARGON PLASMA COAGULATION/BICAP);  Surgeon: Carol Ada, MD;  Location: [place];  Service: Endoscopy;  Laterality: N/A;   HOT HEMOSTASIS N/A  02/15/2022   Procedure: HOT HEMOSTASIS (ARGON PLASMA COAGULATION/BICAP);  Surgeon: Sharyn Creamer, MD;  Location: [place];  Service: Gastroenterology;  Laterality: N/A;   INSERT / REPLACE / REMOVE PACEMAKER  05-01-11   02-28-05-/05-04-10-ICD-MEDTRONIC MAXIMAL DR   IR ANGIOGRAM SELECTIVE EACH ADDITIONAL VESSEL  02/18/2022   IR ANGIOGRAM SELECTIVE EACH ADDITIONAL VESSEL  02/18/2022   IR ANGIOGRAM VISCERAL SELECTIVE  02/16/2022   IR ANGIOGRAM VISCERAL SELECTIVE  02/16/2022   IR EMBO ART  VEN HEMORR LYMPH EXTRAV  INC GUIDE ROADMAPPING  02/16/2022   IR THORACENTESIS ASP PLEURAL SPACE W/IMG GUIDE  02/21/2022   IR THORACENTESIS ASP PLEURAL SPACE W/IMG GUIDE  02/22/2022   IR US GUIDE VASC ACCESS RIGHT  02/16/2022   JOINT REPLACEMENT     LAPAROSCOPIC CHOLECYSTECTOMY  11/11/2012   LAPAROSCOPIC LYSIS OF ADHESIONS N/A 11/11/2012   Procedure: LAPAROSCOPIC LYSIS OF ADHESIONS;  Surgeon: Imogene Burn. Georgette Dover, MD;  Location: [place];  Service: General;  Laterality: N/A;   NEPHRECTOMY Right 06/2001    S/P RENAL CELL CANCER   PERIPHERAL VASCULAR INTERVENTION Bilateral 02/12/2021   Procedure: PERIPHERAL VASCULAR INTERVENTION;  Surgeon: Waynetta Sandy, MD;  Location: [place] CV LAB;  Service: Cardiovascular;  Laterality: Bilateral;  Iliac artery stents   PRESSURE SENSOR/CARDIOMEMS N/A 02/03/2019   Procedure: PRESSURE SENSOR/CARDIOMEMS;  Surgeon: Larey Dresser, MD;  Location: [place] CV LAB;  Service: Cardiovascular;  Laterality: N/A;   RIGHT HEART CATH N/A 11/09/2018   Procedure: RIGHT HEART CATH;  Surgeon: Larey Dresser, MD;  Location: [place] CV LAB;  Service: Cardiovascular;  Laterality: N/A;   RIGHT HEART CATH N/A 03/08/2019   Procedure: RIGHT HEART CATH;  Surgeon: Larey Dresser, MD;  Location: [place] CV LAB;  Service: Cardiovascular;  Laterality: N/A;   SUBMUCOSAL TATTOO INJECTION  02/28/2019   Procedure: SUBMUCOSAL TATTOO INJECTION;  Surgeon: Milus Banister, MD;  Location: [facility]  ENDOSCOPY;  Service: Endoscopy;;   TOTAL HIP ARTHROPLASTY Right 05/03/2011   Procedure: TOTAL HIP ARTHROPLASTY ANTERIOR APPROACH;  Surgeon: Mcarthur Rossetti;  Location: [facility] ORS;  Service: Orthopedics;  Laterality: Right;  Removal of Cannulated Screws Right Hip, Right Direct Anterior Hip Replacement   TOTAL HIP ARTHROPLASTY Left 10/09/2018   Procedure: TOTAL HIP ARTHROPLASTY ANTERIOR APPROACH;  Surgeon: Rod Can, MD;  Location: [place];  Service: Orthopedics;  Laterality: Left;     Outpatient Encounter Medications as of 07/09/2022  Medication Sig   acetaminophen (TYLENOL) 500 MG tablet Take 500-1,000 mg by mouth every 8 (eight) hours as needed for mild pain or headache.   apixaban (ELIQUIS) 2.5 MG TABS tablet Take 1 tablet (2.5 mg total) by mouth 2 (two) times daily.   Biotin 1000 MCG CHEW Chew 2 1e11 Vector Genomes by mouth daily.   Cyanocobalamin (B-12  PO) Take 1 tablet by mouth daily.   dapagliflozin propanediol (FARXIGA) 10 MG TABS tablet Take 1 tablet (10 mg total) by mouth daily before breakfast.   fluticasone (FLONASE) 50 MCG/ACT nasal spray Place 2 sprays into both nostrils daily as needed for allergies or rhinitis.   pantoprazole (PROTONIX) 40 MG tablet Take 1 tablet (40 mg total) by mouth daily.   potassium chloride SA (KLOR-CON M) 20 MEQ tablet Take 1 tablet (20 mEq total) by mouth daily.   rosuvastatin (CRESTOR) 40 MG tablet Take 1 tablet (40 mg total) by mouth daily.   torsemide (DEMADEX) 20 MG tablet Take 4 tablets (80 mg total) by mouth daily.   vitamin C (ASCORBIC ACID) 250 MG tablet Take 500 mg by mouth daily.   VITAMIN D PO Take 4,000 Units by mouth daily.   No facility-administered encounter medications on file as of 07/09/2022.     Today's Vitals   07/09/22 1010  BP: (!) 135/54  Pulse: (!) 59  Resp: 18  Temp: 98.3 F (36.8 C)  TempSrc: Oral  SpO2: 99%  Weight: 137 lb 14.4 oz (62.6 kg)  Height: 5' 6.5" (1.689 m)   Sitting: 142/59, HR 60 Standing: 154/54, HR  60   Body mass index is 21.92 kg/m.   PHYSICAL EXAM GENERAL:alert, no distress and comfortable SKIN: no rash  EYES: sclera clear LUNGS: clear with normal breathing effort HEART: regular rate & rhythm, mild lower extremity edema ABDOMEN: abdomen soft, non-tender and normal bowel sounds NEURO: alert & oriented x 3 with fluent speech     CBC    Component Value Date/Time   WBC 3.3 (L) 07/09/2022 0936   WBC 3.6 (L) 05/20/2022 1247   RBC 3.82 (L) 07/09/2022 0936   HGB 10.5 (L) 07/09/2022 0936   HGB 8.1 (L) 03/22/2019 1415   HCT 33.1 (L) 07/09/2022 0936   HCT 25.9 (L) 03/22/2019 1415   PLT 153 07/09/2022 0936   PLT 226 03/22/2019 1415   MCV 86.6 07/09/2022 0936   MCV 88 03/22/2019 1415   MCH 27.5 07/09/2022 0936   MCHC 31.7 07/09/2022 0936   RDW 16.6 (H) 07/09/2022 0936   RDW 19.9 (H) 03/22/2019 1415   LYMPHSABS 0.6 (L) 07/09/2022 0936   LYMPHSABS 0.6 (L) 03/22/2019 1415   MONOABS 0.4 07/09/2022 0936   EOSABS 0.0 07/09/2022 0936   EOSABS 0.2 03/22/2019 1415   BASOSABS 0.0 07/09/2022 0936   BASOSABS 0.0 03/22/2019 1415     CMP     Component Value Date/Time   NA 139 07/09/2022 0936   NA 141 07/14/2019 0000   K 4.8 07/09/2022 0936   CL 98 07/09/2022 0936   CO2 35 (H) 07/09/2022 0936   GLUCOSE 100 (H) 07/09/2022 0936   BUN 41 (H) 07/09/2022 0936   BUN 29 (A) 07/14/2019 0000   CREATININE 1.58 (H) 07/09/2022 0936   CREATININE 1.34 (H) 02/12/2022 1655   CALCIUM 9.8 07/09/2022 0936   PROT 7.7 07/09/2022 0936   PROT 7.2 06/09/2014 1317   ALBUMIN 4.4 07/09/2022 0936   ALBUMIN 3.8 06/09/2014 1317   AST 16 07/09/2022 0936   ALT 6 07/09/2022 0936   ALT 23 06/09/2014 1317   ALKPHOS 54 07/09/2022 0936   ALKPHOS 71 06/09/2014 1317   BILITOT 0.5 07/09/2022 0936   GFRNONAA 32 (L) 07/09/2022 0936   GFRAA 39 (L) 12/23/2019 1458     ASSESSMENT & PLAN:89 year old female with   Neuroendocrine tumor of pancreatic head, 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   -Outpatient EUS 03/04/2022 by Dr. Rush Landmark showed a T3N0 mass in the pancreatic head/uncinate process with 2 satellite lesions in the tail; biopsy confirmed neuroendocrine tumor of the pancreatic head mass -We reviewed her case in GI conference, Dr. Zenia Resides did not recommend surgery in multifocal NET and also due to her age and co-morbidities. -She began first line sandostatin injections q28 days on 03/27/22.  -Dotatate PET scan, done 2 weeks after first injection 12/6, shows no significant tracer avidity in the pancrease or distant sites. Findings are likely due to close timing relative to the sandostatin injection blocking receptors. When we repeat this for restaging, will arrange 4 weeks after sandostatin -[name] appears stable. S/p 3 months of sandostatin inj q28 days, tolerating well overall. Her diarrhea and flushing have improved, and Chromogranin A normalized on treatment, she is likely responding well.  -She has history of dizziness, more lately possibly since increasing diuretic.  She will call cardiology and PCP.  No evidence of orthostatic hypotension today in clinic -Labs reviewed, stable; chromogranin is pending -Proceed with Sandostatin today as planned, continue q. 28 days -Follow-up 4/4 with next injection  Multifactorial anemia, secondary to GI bleed, h/o IDA and B12 deficiency, and anemia of chronic disease -She has a longstanding history of anemia for at least 10 years, likely more; and history of GI bleeding from AVMs -She has received various formulations of IV iron in the past (ferlecit in 2020, Feraheme in 02/2022), B12 injections (last in 2021), and ESA (Retacrit, 2021) -During hospitalization 02/13/22 - 03/01/2022  she had 2 duodenal angioectasias that were treated with APC, 2 doses of Feraheme, and a total of 6 units of red cell transfusions (she had a transfusion reaction and required ICU placement and premeds for subsequent transfusions); melena resolved at discharge -hemoglobin stable in the 9 range; ferritin is high, hold additional iron for now -Hgb improved 10.5 today   Weakness, deconditioning  -Prior to her hospitalization in October she was functional at home, cooking, cleaning, and driving locally -after hospital d/c she was more sedentary, requires a walker, and is on 2-3 L of oxygen; she is profoundly deconditioned -She gets PT at home through [facility] -Her fatigue and weakness seem out of proportion to the low disease burden, but certainly could be related, and is multifactorial secondary to her anemia and other comorbidities -She feels stronger after starting treatment   COVID-19, new oxygen requirement, pleural effusions, h/o COPD -She smoked for 30 years in the past, less than 1 pack/day.  She has COPD but no prior oxygen requirement.  -She underwent therapeutic thoracentesis in the hospital with 800-900 cc removed from each lung; cytology not done -Unclear how much of this is related to the neuroendocrine tumor, possibly recent COVID, or other -She recently developed sinus and R ear infection, on cefdinir per PCP. Improving -On 2 L O2, finally able to get portable oxygen tank   Afib, CHF, carotid stenosis, HTN, ICD placement -She is on Eliquis for permanent Afib, cardiology prefers she stay on this for high risk. -We will monitor hgb closely given her bleeding risk on ac.  -recent echo 02/15/2022 showed LVEF 60-65% -leg edema improving on torsemide 60 mg total daily, this was recently increased. -  Continue PCP and cardiology follow-up   H/o Renal cell carcinoma and CKD -S/p right nephrectomy in 2003 -Baseline SCr 1.2-1.8 since 2019 -Stable   Social -Patient's 21 year old great  grandson lives with her, he is a Airline pilot and assists around the house -Has 2 children, her niece has been present for visits -She has strong family support   Goals of care -She met with palliative care team while in the hospital to establish relationship and goals of care -She understands that without surgery this is not curable disease, but still treatable; she is interested in treatment for now; goal is palliative  PLAN: -Labs reviewed -Sandostatin today as planned (4th dose) -Lab, f/up and inj 4/4 -F/up cardiology and PCP for dizziness and other chronic conditions     All questions were answered. The patient knows to call the clinic with any problems, questions or concerns. No barriers to learning were detected. I spent 20 minutes counseling the patient face to face. The total time spent in the appointment was 30 minutes and more than 50% was on counseling, review of test results, and coordination of care.   Cira Rue, NP-C 07/09/2022

## 2022-07-11 ENCOUNTER — Inpatient Hospital Stay: Payer: PPO

## 2022-07-11 ENCOUNTER — Inpatient Hospital Stay: Payer: PPO | Admitting: Nurse Practitioner

## 2022-07-11 ENCOUNTER — Encounter: Payer: Self-pay | Admitting: Internal Medicine

## 2022-07-11 LAB — 5 HIAA, QUANTITATIVE, URINE, 24 HOUR
5-HIAA, Ur: 2.5 mg/L
5-HIAA,Quant.,24 Hr Urine: 4.6 mg/24 hr (ref 0.0–14.9)
Total Volume: 1825

## 2022-07-11 LAB — CHROMOGRANIN A: Chromogranin A (ng/mL): 76.2 ng/mL (ref 0.0–101.8)

## 2022-07-18 DIAGNOSIS — M6281 Muscle weakness (generalized): Secondary | ICD-10-CM | POA: Diagnosis not present

## 2022-07-18 DIAGNOSIS — Z4789 Encounter for other orthopedic aftercare: Secondary | ICD-10-CM | POA: Diagnosis not present

## 2022-07-18 DIAGNOSIS — I5033 Acute on chronic diastolic (congestive) heart failure: Secondary | ICD-10-CM | POA: Diagnosis not present

## 2022-07-18 DIAGNOSIS — J90 Pleural effusion, not elsewhere classified: Secondary | ICD-10-CM | POA: Diagnosis not present

## 2022-07-18 DIAGNOSIS — R2689 Other abnormalities of gait and mobility: Secondary | ICD-10-CM | POA: Diagnosis not present

## 2022-07-18 DIAGNOSIS — J449 Chronic obstructive pulmonary disease, unspecified: Secondary | ICD-10-CM | POA: Diagnosis not present

## 2022-07-22 ENCOUNTER — Inpatient Hospital Stay: Payer: PPO

## 2022-07-22 ENCOUNTER — Inpatient Hospital Stay: Payer: PPO | Admitting: Nurse Practitioner

## 2022-07-24 ENCOUNTER — Ambulatory Visit (INDEPENDENT_AMBULATORY_CARE_PROVIDER_SITE_OTHER): Payer: PPO | Admitting: Family Medicine

## 2022-07-24 ENCOUNTER — Encounter: Payer: Self-pay | Admitting: Family Medicine

## 2022-07-24 ENCOUNTER — Other Ambulatory Visit (HOSPITAL_COMMUNITY): Payer: Self-pay | Admitting: Cardiology

## 2022-07-24 ENCOUNTER — Encounter (HOSPITAL_COMMUNITY): Payer: PPO

## 2022-07-24 VITALS — BP 138/70 | HR 58 | Temp 97.2°F | Ht 66.5 in | Wt 141.2 lb

## 2022-07-24 DIAGNOSIS — C44729 Squamous cell carcinoma of skin of left lower limb, including hip: Secondary | ICD-10-CM

## 2022-07-24 DIAGNOSIS — D3A8 Other benign neuroendocrine tumors: Secondary | ICD-10-CM

## 2022-07-24 DIAGNOSIS — E782 Mixed hyperlipidemia: Secondary | ICD-10-CM | POA: Diagnosis not present

## 2022-07-24 DIAGNOSIS — I5032 Chronic diastolic (congestive) heart failure: Secondary | ICD-10-CM | POA: Diagnosis not present

## 2022-07-24 DIAGNOSIS — I4821 Permanent atrial fibrillation: Secondary | ICD-10-CM

## 2022-07-24 DIAGNOSIS — Z905 Acquired absence of kidney: Secondary | ICD-10-CM | POA: Diagnosis not present

## 2022-07-24 DIAGNOSIS — Z66 Do not resuscitate: Secondary | ICD-10-CM

## 2022-07-24 DIAGNOSIS — D649 Anemia, unspecified: Secondary | ICD-10-CM

## 2022-07-24 DIAGNOSIS — H9191 Unspecified hearing loss, right ear: Secondary | ICD-10-CM

## 2022-07-24 DIAGNOSIS — N1832 Chronic kidney disease, stage 3b: Secondary | ICD-10-CM | POA: Diagnosis not present

## 2022-07-24 DIAGNOSIS — E039 Hypothyroidism, unspecified: Secondary | ICD-10-CM | POA: Diagnosis not present

## 2022-07-24 DIAGNOSIS — K922 Gastrointestinal hemorrhage, unspecified: Secondary | ICD-10-CM

## 2022-07-24 LAB — LIPID PANEL
Cholesterol: 209 mg/dL — ABNORMAL HIGH (ref 0–200)
HDL: 42.2 mg/dL (ref >39.00)
LDL Cholesterol: 141 mg/dL — ABNORMAL HIGH (ref 0–99)
NonHDL: 166.79
Total CHOL/HDL Ratio: 5
Triglycerides: 130 mg/dL (ref 0.0–149.0)
VLDL: 26 mg/dL (ref 0.0–40.0)

## 2022-07-24 LAB — T4, FREE: Free T4: 1.3 ng/dL (ref 0.60–1.60)

## 2022-07-24 LAB — TSH: TSH: 2.93 u[IU]/mL (ref 0.35–5.50)

## 2022-07-24 NOTE — Progress Notes (Unsigned)
Patient ID: [name], female    DOB: 10/17/1933, 89 y.o.   MRN: [id number]  This visit was conducted in person.  BP 138/70   Pulse (!) 58   Temp (!) 97.2 F (36.2 C) (Temporal)   Ht 5' 6.5" (1.689 m)   Wt 141 lb 4 oz (64.1 kg)   SpO2 99% Comment: 2 L  BMI 22.46 kg/m   No results found.   CC: 3 mo f/u visit  Subjective:   HPI: CARISA TARANTOLA is a 89 y.o. female presenting on 07/24/2022 for Medical Management of Chronic Issues (Here for 3 mo f/u. Pt accompanied by niece, Nevin Bloodgood. )   Notes ongoing difficulty with muffled hearing and ears stopped up, R>L. She had acute serous otitis and sinusitis treated 05/2022 (initial augmentin was not tolerated due to GI upset so changed to cefdinir) - has persistent muffled hearing to R ear since then.  Notes dizziness for years, acutely worse over the past month. Describes lightheadedness worse when first standing, without vertigo.   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    Sees CHF clinic for known chronic diastolic CHF with RV failure, last seen 06/2022. Has had technical difficulties with [employer]. Now on torsemide 80mg  daily, farxiga 10mg  recently started. Continues low dose eliquis for afib.   Saw EP Caryl Comes) 06/2022 - discussed DNR status, they inactivated high-voltage therapies. Confirmed with patient today.   She continues 2L [place] via portable oxygen concentrator.   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   HLD - previously on crestor and zetia but stopped taking upon hospitalization - has since restarted crestor 40mg  daily Lab Results  Component Value Date   CHOL 209 (H) 07/24/2022   HDL 42.20 07/24/2022   LDLCALC 141 (H) 07/24/2022   LDLDIRECT 146.0 11/07/2016   TRIG 130.0 07/24/2022   CHOLHDL 5 07/24/2022    Hypothyroidism - was previously on synthroid, this was stopped during latest  hospitalization Lab Results  Component Value Date   TSH 2.93 07/24/2022        Relevant past medical, surgical, family and social history reviewed and updated as indicated. Interim medical history since our last visit reviewed. Allergies and medications reviewed and updated. Outpatient Medications Prior to Visit  Medication Sig Dispense Refill   acetaminophen (TYLENOL) 500 MG tablet Take 500-1,000 mg by mouth every 8 (eight) hours as needed for mild pain or headache.     Biotin 1000 MCG CHEW Chew 2 1e11 [employer] by mouth daily.     Cyanocobalamin (B-12 PO) Take 1 tablet by mouth daily.     dapagliflozin propanediol (FARXIGA) 10 MG TABS tablet Take 1 tablet (10 mg total) by mouth daily before breakfast. 30 tablet 11   fluticasone (FLONASE) 50 MCG/ACT nasal spray Place 2 sprays into both nostrils daily as needed for allergies or rhinitis. 16 g 0   pantoprazole (PROTONIX) 40 MG tablet Take 1 tablet (40 mg total) by mouth daily. 90 tablet 1   potassium chloride SA (KLOR-CON M) 20 MEQ tablet Take 1 tablet (20 mEq total) by mouth daily. 30 tablet 0   rosuvastatin (CRESTOR) 40 MG tablet Take 1 tablet (40 mg total) by mouth daily. 30 tablet 11   torsemide (DEMADEX) 20 MG tablet Take 4 tablets (80 mg total) by mouth daily. 200 tablet 4   vitamin C (ASCORBIC ACID) 250 MG tablet Take 500 mg by  mouth daily.     VITAMIN D PO Take 4,000 Units by mouth daily.     apixaban (ELIQUIS) 2.5 MG TABS tablet Take 1 tablet (2.5 mg total) by mouth 2 (two) times daily. 60 tablet 0   No facility-administered medications prior to visit.     Per HPI unless specifically indicated in ROS section below Review of Systems  Objective:  BP 138/70   Pulse (!) 58   Temp (!) 97.2 F (36.2 C) (Temporal)   Ht 5' 6.5" (1.689 m)   Wt 141 lb 4 oz (64.1 kg)   SpO2 99% Comment: 2 L  BMI 22.46 kg/m   Wt Readings from Last 3 Encounters:  07/24/22 141 lb 4 oz (64.1 kg)  07/09/22 137 lb 14.4 oz (62.6 kg)  06/13/22  134 lb 4 oz (60.9 kg)      Physical Exam Vitals and nursing note reviewed.  Constitutional:      Appearance: Normal appearance. She is not ill-appearing.     Comments: Supplemental O2 via [place]  HENT:     Right Ear: Tympanic membrane, ear canal and external ear normal. Decreased hearing noted.     Left Ear: Hearing, tympanic membrane, ear canal and external ear normal.     Mouth/Throat:     Mouth: Mucous membranes are moist.     Pharynx: Oropharynx is clear.  Eyes:     Extraocular Movements: Extraocular movements intact.     Conjunctiva/sclera: Conjunctivae normal.     Pupils: Pupils are equal, round, and reactive to light.  Cardiovascular:     Rate and Rhythm: Normal rate and regular rhythm.     Pulses: Normal pulses.     Heart sounds: Normal heart sounds. No murmur heard. Pulmonary:     Effort: Pulmonary effort is normal. No respiratory distress.     Breath sounds: Normal breath sounds. No wheezing or rhonchi.  Musculoskeletal:     Right lower leg: No edema.     Left lower leg: No edema.  Skin:    General: Skin is warm and dry.     Findings: No rash.  Neurological:     Mental Status: She is alert.  Psychiatric:        Mood and Affect: Mood normal.        Behavior: Behavior normal.       Results for orders placed or performed in visit on 07/24/22  Lipid panel  Result Value Ref Range   Cholesterol 209 (H) 0 - 200 mg/dL   Triglycerides 130.0 0.0 - 149.0 mg/dL   HDL 42.20 >39.00 mg/dL   VLDL 26.0 0.0 - 40.0 mg/dL   LDL Cholesterol 141 (H) 0 - 99 mg/dL   Total CHOL/HDL Ratio 5    NonHDL 166.79   TSH  Result Value Ref Range   TSH 2.93 0.35 - 5.50 uIU/mL  T4, free  Result Value Ref Range   Free T4 1.30 0.60 - 1.60 ng/dL   *Note: Due to a large number of results and/or encounters for the requested time period, some results have not been displayed. A complete set of results can be found in Results Review.   Lab Results  Component Value Date   WBC 3.3 (L) 07/09/2022    HGB 10.5 (L) 07/09/2022   HCT 33.1 (L) 07/09/2022   MCV 86.6 07/09/2022   PLT 153 07/09/2022    Lab Results  Component Value Date   CREATININE 1.58 (H) 07/09/2022   BUN 41 (H) 07/09/2022  NA 139 07/09/2022   K 4.8 07/09/2022   CL 98 07/09/2022   CO2 35 (H) 07/09/2022    Lab Results  Component Value Date   ALT 6 07/09/2022   AST 16 07/09/2022   ALKPHOS 54 07/09/2022   BILITOT 0.5 07/09/2022    Assessment & Plan:   Problem List Items Addressed This Visit     DNR (do not resuscitate) (Chronic)    DNR status discussed with EP last visit, confirmed today      Mixed hyperlipidemia    Update FLP off zetia, only on crestor 40mg  daily. The ASCVD Risk score (Arnett DK, et al., 2019) failed to calculate for the following reasons:   The 2019 ASCVD risk score is only valid for ages 64 to 36       Relevant Orders   Lipid panel (Completed)   Permanent atrial fibrillation    Continues low dose eliquis.       Chronic kidney disease, stage 3b (HCC)    Component of CKD due to decreased nephron mass post R nephrectomy for RCC 2003      Chronic diastolic CHF (congestive heart failure) ([place])    Appreciate CHF clinic care, now on torsemide 80mg  and farxiga 10mg  daily.       History of nephrectomy secondary to RCC   Anemia    This is regularly followed by oncology.       Hypothyroidism    Off Synthroid since hospitalization 02/2022  Update TSH, fT4 to guide ongoing need for synthroid.       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  She's been on PPI BID since then.  She saw GI post-discharge for EGD/endoscopic [place] 03/04/2022.  Anticipate could drop PPI dose to daily  - will plan to do this starting April  2024 assuming ok by oncology - they will check at upcoming appt in 2 wks.       Neuroendocrine tumor of pancreas    Regularly followed by oncology on monthly octreotide injections - appreciate onc care.      Decreased hearing of right ear - Primary    Ongoing, present for 2 months since R acute serous otitis media s/p treatment with cefdinir. Also notes worsening dizziness without vertigo.  Offered ENT eval - will first try regular flonase x1-2 wks and if persistent muffled hearing she will let me know for ENT referral.         No orders of the defined types were placed in this encounter.   Orders Placed This Encounter  Procedures   Lipid panel   TSH   T4, free    Patient Instructions  Labs today  Try flonase 2 squirts into each nostril daily for 1-2 weeks. If no better, let me know and I will place referral to ENT in [place].  Continue pantoprazole 40mg  twice daily, but plan likely to drop to once daily next month, if ok by oncology.  Good to see you today Return in 3-4 months for physical /wellness visit.   Follow up plan: Return in about 3 months (around 10/24/2022), or if symptoms worsen or fail to improve, for annual exam, prior fasting  for blood work, medicare wellness visit.  Ria Bush, MD

## 2022-07-24 NOTE — Patient Instructions (Addendum)
Labs today  Try flonase 2 squirts into each nostril daily for 1-2 weeks. If no better, let me know and I will place referral to ENT in [place].  Continue pantoprazole 40mg  twice daily, but plan likely to drop to once daily next month, if ok by oncology.  Good to see you today Return in 3-4 months for physical /wellness visit.

## 2022-07-25 ENCOUNTER — Encounter: Payer: Self-pay | Admitting: Family Medicine

## 2022-07-25 DIAGNOSIS — H9191 Unspecified hearing loss, right ear: Secondary | ICD-10-CM | POA: Insufficient documentation

## 2022-07-25 DIAGNOSIS — Z66 Do not resuscitate: Secondary | ICD-10-CM | POA: Insufficient documentation

## 2022-07-25 NOTE — Assessment & Plan Note (Addendum)
Off Synthroid since hospitalization 02/2022  Update TSH, fT4 to guide ongoing need for synthroid.

## 2022-07-25 NOTE — Assessment & Plan Note (Signed)
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  She's been on PPI BID since then.  She saw GI post-discharge for EGD/endoscopic [place] 03/04/2022.  Anticipate could drop PPI dose to daily  - will plan to do this starting April 2024 assuming ok by oncology - they will check at upcoming appt in 2 wks.

## 2022-07-25 NOTE — Assessment & Plan Note (Signed)
Component of CKD due to decreased nephron mass post R nephrectomy for [facility] 2003

## 2022-07-25 NOTE — Assessment & Plan Note (Signed)
This is regularly followed by oncology.

## 2022-07-25 NOTE — Assessment & Plan Note (Addendum)
Update FLP off zetia, only on crestor 40mg  daily. The ASCVD Risk score (Arnett DK, et al., 2019) failed to calculate for the following reasons:   The 2019 ASCVD risk score is only valid for ages 23 to 42

## 2022-07-25 NOTE — Assessment & Plan Note (Signed)
DNR status discussed with EP last visit, confirmed today

## 2022-07-25 NOTE — Assessment & Plan Note (Addendum)
Ongoing, present for 2 months since R acute serous otitis media s/p treatment with cefdinir. Also notes worsening dizziness without vertigo.  Offered ENT eval - will first try regular flonase x1-2 wks and if persistent muffled hearing she will let me know for ENT referral.

## 2022-07-25 NOTE — Assessment & Plan Note (Addendum)
Appreciate CHF clinic care, now on torsemide 80mg  and farxiga 10mg  daily.

## 2022-07-25 NOTE — Progress Notes (Signed)
Remote ICD transmission.   

## 2022-07-25 NOTE — Assessment & Plan Note (Signed)
Continues low dose eliquis.  

## 2022-07-25 NOTE — Assessment & Plan Note (Signed)
Recurrent - recently saw derm, referred for Mohs surgery

## 2022-07-25 NOTE — Assessment & Plan Note (Signed)
Regularly followed by oncology on monthly octreotide injections - appreciate onc care.

## 2022-07-29 ENCOUNTER — Encounter (HOSPITAL_COMMUNITY): Payer: Self-pay | Admitting: Adult Health

## 2022-07-29 ENCOUNTER — Telehealth (HOSPITAL_COMMUNITY): Payer: Self-pay | Admitting: Adult Health

## 2022-07-29 DIAGNOSIS — C44729 Squamous cell carcinoma of skin of left lower limb, including hip: Secondary | ICD-10-CM | POA: Diagnosis not present

## 2022-07-29 DIAGNOSIS — D485 Neoplasm of uncertain behavior of skin: Secondary | ICD-10-CM | POA: Diagnosis not present

## 2022-07-29 NOTE — Telephone Encounter (Signed)
[employer] Remote Monitoring  S/P [employer] Implant   PAD Goal: 17 Most recent reading: 21 Suggestive of fluid accumulation   Recommended changes: Please call and instruct to take an additional 40 mg of torsemide in the evening x 2 days  I continue to review and analyze the patients PA pressures weekly (and more often as needed) to bring PA pressures within the optimal range.    Lynee Rosenbach NP-C  4:33 PM

## 2022-07-30 NOTE — Progress Notes (Signed)
Error   Aaliyana Fredericks NP-C

## 2022-07-30 NOTE — Progress Notes (Deleted)
Error Amour Cutrone NP-C  8:50 AM

## 2022-08-01 NOTE — Telephone Encounter (Signed)
Pt aware.

## 2022-08-04 DIAGNOSIS — M6281 Muscle weakness (generalized): Secondary | ICD-10-CM | POA: Diagnosis not present

## 2022-08-04 DIAGNOSIS — I5033 Acute on chronic diastolic (congestive) heart failure: Secondary | ICD-10-CM | POA: Diagnosis not present

## 2022-08-04 DIAGNOSIS — J449 Chronic obstructive pulmonary disease, unspecified: Secondary | ICD-10-CM | POA: Diagnosis not present

## 2022-08-04 DIAGNOSIS — K8689 Other specified diseases of pancreas: Secondary | ICD-10-CM | POA: Diagnosis not present

## 2022-08-04 DIAGNOSIS — R2689 Other abnormalities of gait and mobility: Secondary | ICD-10-CM | POA: Diagnosis not present

## 2022-08-04 DIAGNOSIS — J90 Pleural effusion, not elsewhere classified: Secondary | ICD-10-CM | POA: Diagnosis not present

## 2022-08-04 DIAGNOSIS — Z4789 Encounter for other orthopedic aftercare: Secondary | ICD-10-CM | POA: Diagnosis not present

## 2022-08-06 ENCOUNTER — Telehealth (HOSPITAL_COMMUNITY): Payer: Self-pay | Admitting: Adult Health

## 2022-08-06 NOTE — Telephone Encounter (Signed)
Left message to call back  

## 2022-08-06 NOTE — Telephone Encounter (Signed)
  [employer]ems Remote Monitoring  S/P [employer] Implant 02/03/2019   PAD Goal:  17 Most recent reading: 22 Suggestive of fluid accumulation .   Recommended changes:  Increase torsemide 100 mg x 4 days then back to 80 mg daily. Please call.   I continue to review and analyze the patients PA pressures weekly (and more often as needed) to bring PA pressures within the optimal range.    Joneric Streight NP-C  9:42 AM

## 2022-08-07 NOTE — Telephone Encounter (Signed)
I spoke to patient today and went over medication changes and she verbalized understanding. She will let us know how she does.

## 2022-08-08 ENCOUNTER — Inpatient Hospital Stay: Payer: PPO

## 2022-08-08 ENCOUNTER — Inpatient Hospital Stay: Payer: PPO | Attending: Nurse Practitioner

## 2022-08-08 ENCOUNTER — Other Ambulatory Visit: Payer: Self-pay

## 2022-08-08 ENCOUNTER — Encounter: Payer: Self-pay | Admitting: Nurse Practitioner

## 2022-08-08 ENCOUNTER — Inpatient Hospital Stay (HOSPITAL_BASED_OUTPATIENT_CLINIC_OR_DEPARTMENT_OTHER): Payer: PPO | Admitting: Nurse Practitioner

## 2022-08-08 VITALS — BP 150/44 | HR 59 | Temp 97.5°F | Resp 18 | Wt 141.4 lb

## 2022-08-08 DIAGNOSIS — D638 Anemia in other chronic diseases classified elsewhere: Secondary | ICD-10-CM | POA: Diagnosis not present

## 2022-08-08 DIAGNOSIS — D3A8 Other benign neuroendocrine tumors: Secondary | ICD-10-CM | POA: Insufficient documentation

## 2022-08-08 DIAGNOSIS — Z8719 Personal history of other diseases of the digestive system: Secondary | ICD-10-CM

## 2022-08-08 DIAGNOSIS — D649 Anemia, unspecified: Secondary | ICD-10-CM

## 2022-08-08 LAB — CBC WITH DIFFERENTIAL (CANCER CENTER ONLY)
Abs Immature Granulocytes: 0.01 10*3/uL (ref 0.00–0.07)
Basophils Absolute: 0 10*3/uL (ref 0.0–0.1)
Basophils Relative: 1 %
Eosinophils Absolute: 0 10*3/uL (ref 0.0–0.5)
Eosinophils Relative: 0 %
HCT: 32 % — ABNORMAL LOW (ref 36.0–46.0)
Hemoglobin: 10 g/dL — ABNORMAL LOW (ref 12.0–15.0)
Immature Granulocytes: 0 %
Lymphocytes Relative: 14 %
Lymphs Abs: 0.5 10*3/uL — ABNORMAL LOW (ref 0.7–4.0)
MCH: 27.5 pg (ref 26.0–34.0)
MCHC: 31.3 g/dL (ref 30.0–36.0)
MCV: 87.9 fL (ref 80.0–100.0)
Monocytes Absolute: 0.4 10*3/uL (ref 0.1–1.0)
Monocytes Relative: 12 %
Neutro Abs: 2.7 10*3/uL (ref 1.7–7.7)
Neutrophils Relative %: 73 %
Platelet Count: 175 10*3/uL (ref 150–400)
RBC: 3.64 MIL/uL — ABNORMAL LOW (ref 3.87–5.11)
RDW: 16.8 % — ABNORMAL HIGH (ref 11.5–15.5)
WBC Count: 3.6 10*3/uL — ABNORMAL LOW (ref 4.0–10.5)
nRBC: 0 % (ref 0.0–0.2)

## 2022-08-08 LAB — CMP (CANCER CENTER ONLY)
ALT: 9 U/L (ref 0–44)
AST: 16 U/L (ref 15–41)
Albumin: 4.7 g/dL (ref 3.5–5.0)
Alkaline Phosphatase: 54 U/L (ref 38–126)
Anion gap: 7 (ref 5–15)
BUN: 35 mg/dL — ABNORMAL HIGH (ref 8–23)
CO2: 34 mmol/L — ABNORMAL HIGH (ref 22–32)
Calcium: 9.9 mg/dL (ref 8.9–10.3)
Chloride: 99 mmol/L (ref 98–111)
Creatinine: 1.47 mg/dL — ABNORMAL HIGH (ref 0.44–1.00)
GFR, Estimated: 35 mL/min — ABNORMAL LOW (ref >60)
Glucose, Bld: 91 mg/dL (ref 70–99)
Potassium: 4.7 mmol/L (ref 3.5–5.1)
Sodium: 140 mmol/L (ref 135–145)
Total Bilirubin: 0.6 mg/dL (ref 0.3–1.2)
Total Protein: 7.9 g/dL (ref 6.5–8.1)

## 2022-08-08 LAB — IRON AND IRON BINDING CAPACITY (CC-WL,HP ONLY)
Iron: 49 ug/dL (ref 28–170)
Saturation Ratios: 16 % (ref 10.4–31.8)
TIBC: 302 ug/dL (ref 250–450)
UIBC: 253 ug/dL (ref 148–442)

## 2022-08-08 LAB — FERRITIN: Ferritin: 71 ng/mL (ref 11–307)

## 2022-08-08 MED ORDER — OCTREOTIDE ACETATE 20 MG IM KIT
20.0000 mg | PACK | Freq: Once | INTRAMUSCULAR | Status: AC
  Administered 2022-08-08: 20 mg via INTRAMUSCULAR
  Filled 2022-08-08: qty 1

## 2022-08-08 NOTE — Progress Notes (Signed)
Patient Care Team: Ria Bush, MD as PCP - General (Family Medicine) Deboraha Sprang, MD as PCP - Cardiology (Cardiology) Larey Dresser, MD as PCP - Advanced Heart Failure (Cardiology) Minna Merritts, MD as Consulting Physician (Cardiology) Deboraha Sprang, MD as Consulting Physician (Cardiology) Delana Meyer, Dolores Lory, MD as Consulting Physician (Vascular Surgery) Sindy Guadeloupe, MD as Consulting Physician (Hematology and Oncology) Truitt Merle, MD as Consulting Physician (Oncology) Charlton Haws, [facility] as Pharmacist (Pharmacist)   CHIEF COMPLAINT: Follow up NET of the pancreas  CURRENT THERAPY: First line Sandostatin injection q28 days, starting 03/27/22   INTERVAL HISTORY [name] returns for follow up and treatment as scheduled, last seen by me 07/09/22; she continues monthly sandostatin.  In general she has less cramping abdominal pain, diarrhea, and flushing on Sandostatin.  But for the past 2 weeks she has not felt well, with more dizziness, fluid retention, and weakness.  Seeing PCP and cardiology for management.  She also notes a quarter sized blood on her pillow some mornings, but none for past 2 nights.  ROS  All other systems reviewed and negative  Past Medical History:  Diagnosis Date   Actinic keratosis 01/17/2015   R forearm   Adjustment disorder with anxiety    Anemia    Anginal pain ([place])    Arthritis    "some in my hands" (11/11/2012)   Atrial fibrillation (HCC)    Atypical mole 03/25/2018   L forearm - severe   Automatic implantable cardioverter-defibrillator in situ    Avascular necrosis of hip ([place]) 05/03/2011   Blood in stool    Carotid artery stenosis 09/2007   a. 09/2007: 60-79% bilateral (stable); b. 10/2008: 40-59% R 60-79%    Cellulitis of left lower extremity 07/13/2020   Chest pain, unspecified    Chronic airway obstruction, not elsewhere classified    Chronic bronchitis ([place])    "get it some; not q year" (11/11/2012)   Chronic  diastolic CHF (congestive heart failure) ([place]) 06/04/2013   Chronic kidney disease, unspecified    Coronary artery disease    non-obstructive by 2006 cath   COVID-19 virus infection 02/12/2022   Displaced fracture of left femoral neck ([place]) 10/09/2018   Frequent UTI    "get them a couple times/yr" (11/11/2012)   GI bleed 03/28/2020   Hemorrhage of rectum and anus    High cholesterol    History of blood transfusion 04/2011   "after hip OR" (11/11/2012)   Hx of renal cell cancer    Hypertension 05/20/2011   Hypertr obst cardiomyop    Hypotension, unspecified    cardiac cath 2006..nonobstructive CAD 30-40s lesions.Marland KitchenETT 1/09 nondiagnostic due to poor HR response..Right Renal Cancer 2003   Long term (current) use of anticoagulants    Malignant neoplasm of kidney, except pelvis    Osteoarthritis of right hip    Other and unspecified coagulation defects    PONV (postoperative nausea and vomiting)    Presence of permanent cardiac pacemaker    RECTAL BLEEDING 10/13/2009   Qualifier: Diagnosis of  By: Chester Holstein NP, Paula     Renal cancer [facility]) 06/2001   Right   Secondary cardiomyopathy, unspecified    Sinus bradycardia    Squamous cell carcinoma of skin 01/17/2015   R lat wrist   Squamous cell carcinoma of skin 01/29/2018   R post upper leg - superficially invasive   Squamous cell carcinoma of skin 03/25/2018   L lat foot   Squamous cell carcinoma  of skin 11/09/2020   left lat foot - EDC 01/01/21, recurrent 01/31/21 - MOHs 02/22/21   Squamous cell carcinoma of skin 12/19/2021   Right Posterior Medial Thigh, EDC   Squamous cell carcinoma of skin 12/19/2021   SCC IS, L lat heel, EDC 02/04/2022   Squamous cell carcinoma of skin 01/21/2022   L forearm, EDC 02/04/2022   Squamous cell carcinoma of skin 01/21/2022   R lower leg below knee, EDC 02/04/2022   Squamous cell carcinoma of skin 01/21/2022   SCCIS, R post heel, EDC 02/04/2022   Squamous cell carcinoma of skin 07/01/2022   left  lower abdomen, in situ, EDC   Squamous cell carcinoma of skin 07/01/2022   left medial chest, EDC   Urge incontinence      Past Surgical History:  Procedure Laterality Date   ABDOMINAL AORTOGRAM W/LOWER EXTREMITY N/A 02/12/2021   Procedure: ABDOMINAL AORTOGRAM W/LOWER EXTREMITY;  Surgeon: Waynetta Sandy, MD;  Location: [place] CV LAB;  Service: Cardiovascular;  Laterality: N/A;   ABDOMINAL HYSTERECTOMY  1975   for benign causes   APPENDECTOMY     BI-VENTRICULAR PACEMAKER UPGRADE  05/04/2010   BIOPSY  02/28/2019   Procedure: BIOPSY;  Surgeon: Milus Banister, MD;  Location: [facility] ENDOSCOPY;  Service: Endoscopy;;   BIOPSY  03/04/2022   Procedure: BIOPSY;  Surgeon: Irving Copas., MD;  Location: [facility];  Service: Gastroenterology;;   CARDIAC CATHETERIZATION  2006   CARDIOVERSION N/A 02/20/2018   Procedure: CARDIOVERSION;  Surgeon: Minna Merritts, MD;  Location: [place] ORS;  Service: Cardiovascular;  Laterality: N/A;   CARDIOVERSION N/A 03/27/2018   Procedure: CARDIOVERSION;  Surgeon: Minna Merritts, MD;  Location: [facility] ORS;  Service: Cardiovascular;  Laterality: N/A;   CATARACT EXTRACTION W/ INTRAOCULAR LENS  IMPLANT, BILATERAL  01/2006-02-2006   CHOLECYSTECTOMY N/A 11/11/2012   Procedure: LAPAROSCOPIC CHOLECYSTECTOMY WITH INTRAOPERATIVE CHOLANGIOGRAM;  Surgeon: Imogene Burn. Georgette Dover, MD;  Location: [place];  Service: General;  Laterality: N/A;   COLONOSCOPY WITH PROPOFOL N/A 02/28/2019   Procedure: COLONOSCOPY WITH PROPOFOL;  Surgeon: Milus Banister, MD;  Location: [facility] ENDOSCOPY;  Service: Endoscopy;  Laterality: N/A;   ENTEROSCOPY N/A 03/30/2020   Procedure: ENTEROSCOPY;  Surgeon: Lavena Bullion, DO;  Location: [place];  Service: Gastroenterology;  Laterality: N/A;   ENTEROSCOPY N/A 02/10/2021   Procedure: ENTEROSCOPY;  Surgeon: Carol Ada, MD;  Location: [place];  Service: Endoscopy;  Laterality: N/A;   ENTEROSCOPY N/A 02/15/2022   Procedure:  ENTEROSCOPY;  Surgeon: Sharyn Creamer, MD;  Location: [facility] ENDOSCOPY;  Service: Gastroenterology;  Laterality: N/A;   EP IMPLANTABLE DEVICE N/A 02/21/2016   Procedure: ICD Generator Changeout;  Surgeon: Deboraha Sprang, MD;  Location: [place] CV LAB;  Service: Cardiovascular;  Laterality: N/A;   ESOPHAGOGASTRODUODENOSCOPY N/A 03/04/2022   Procedure: ESOPHAGOGASTRODUODENOSCOPY (EGD);  Surgeon: Irving Copas., MD;  Location: [facility];  Service: Gastroenterology;  Laterality: N/A;   ESOPHAGOGASTRODUODENOSCOPY (EGD) WITH PROPOFOL N/A 02/28/2019   Procedure: ESOPHAGOGASTRODUODENOSCOPY (EGD) WITH PROPOFOL;  Surgeon: Milus Banister, MD;  Location: [facility] ENDOSCOPY;  Service: Endoscopy;  Laterality: N/A;   EUS N/A 03/04/2022   Procedure: UPPER ENDOSCOPIC ULTRASOUND (EUS) RADIAL;  Surgeon: Irving Copas., MD;  Location: [facility] ENDOSCOPY;  Service: Gastroenterology;  Laterality: N/A;   FINE NEEDLE ASPIRATION N/A 03/04/2022   Procedure: FINE NEEDLE ASPIRATION (FNA) LINEAR;  Surgeon: Irving Copas., MD;  Location: [facility] ENDOSCOPY;  Service: Gastroenterology;  Laterality: N/A;   GIVENS CAPSULE STUDY N/A 03/15/2019  Procedure: GIVENS CAPSULE STUDY;  Surgeon: Thornton Park, MD;  Location: [place];  Service: Gastroenterology;  Laterality: N/A;   HOT HEMOSTASIS N/A 03/30/2020   Procedure: HOT HEMOSTASIS (ARGON PLASMA COAGULATION/BICAP);  Surgeon: Lavena Bullion, DO;  Location: [facility] ENDOSCOPY;  Service: Gastroenterology;  Laterality: N/A;   HOT HEMOSTASIS N/A 02/10/2021   Procedure: HOT HEMOSTASIS (ARGON PLASMA COAGULATION/BICAP);  Surgeon: Carol Ada, MD;  Location: [place];  Service: Endoscopy;  Laterality: N/A;   HOT HEMOSTASIS N/A 02/15/2022   Procedure: HOT HEMOSTASIS (ARGON PLASMA COAGULATION/BICAP);  Surgeon: Sharyn Creamer, MD;  Location: [place];  Service: Gastroenterology;  Laterality: N/A;   INSERT / REPLACE / REMOVE PACEMAKER  05-01-11    02-28-05-/05-04-10-ICD-MEDTRONIC MAXIMAL DR   IR ANGIOGRAM SELECTIVE EACH ADDITIONAL VESSEL  02/18/2022   IR ANGIOGRAM SELECTIVE EACH ADDITIONAL VESSEL  02/18/2022   IR ANGIOGRAM VISCERAL SELECTIVE  02/16/2022   IR ANGIOGRAM VISCERAL SELECTIVE  02/16/2022   IR EMBO ART  VEN HEMORR LYMPH EXTRAV  INC GUIDE ROADMAPPING  02/16/2022   IR THORACENTESIS ASP PLEURAL SPACE W/IMG GUIDE  02/21/2022   IR THORACENTESIS ASP PLEURAL SPACE W/IMG GUIDE  02/22/2022   IR US GUIDE VASC ACCESS RIGHT  02/16/2022   JOINT REPLACEMENT     LAPAROSCOPIC CHOLECYSTECTOMY  11/11/2012   LAPAROSCOPIC LYSIS OF ADHESIONS N/A 11/11/2012   Procedure: LAPAROSCOPIC LYSIS OF ADHESIONS;  Surgeon: Imogene Burn. Georgette Dover, MD;  Location: [place];  Service: General;  Laterality: N/A;   NEPHRECTOMY Right 06/2001    S/P RENAL CELL CANCER   PERIPHERAL VASCULAR INTERVENTION Bilateral 02/12/2021   Procedure: PERIPHERAL VASCULAR INTERVENTION;  Surgeon: Waynetta Sandy, MD;  Location: [place] CV LAB;  Service: Cardiovascular;  Laterality: Bilateral;  Iliac artery stents   PRESSURE SENSOR/CARDIOMEMS N/A 02/03/2019   Procedure: PRESSURE SENSOR/CARDIOMEMS;  Surgeon: Larey Dresser, MD;  Location: [place] CV LAB;  Service: Cardiovascular;  Laterality: N/A;   RIGHT HEART CATH N/A 11/09/2018   Procedure: RIGHT HEART CATH;  Surgeon: Larey Dresser, MD;  Location: [place] CV LAB;  Service: Cardiovascular;  Laterality: N/A;   RIGHT HEART CATH N/A 03/08/2019   Procedure: RIGHT HEART CATH;  Surgeon: Larey Dresser, MD;  Location: [place] CV LAB;  Service: Cardiovascular;  Laterality: N/A;   SUBMUCOSAL TATTOO INJECTION  02/28/2019   Procedure: SUBMUCOSAL TATTOO INJECTION;  Surgeon: Milus Banister, MD;  Location: [facility] ENDOSCOPY;  Service: Endoscopy;;   TOTAL HIP ARTHROPLASTY Right 05/03/2011   Procedure: TOTAL HIP ARTHROPLASTY ANTERIOR APPROACH;  Surgeon: Mcarthur Rossetti;  Location: [facility];  Service: Orthopedics;  Laterality:  Right;  Removal of Cannulated Screws Right Hip, Right Direct Anterior Hip Replacement   TOTAL HIP ARTHROPLASTY Left 10/09/2018   Procedure: TOTAL HIP ARTHROPLASTY ANTERIOR APPROACH;  Surgeon: Rod Can, MD;  Location: [place];  Service: Orthopedics;  Laterality: Left;     Outpatient Encounter Medications as of 08/08/2022  Medication Sig   acetaminophen (TYLENOL) 500 MG tablet Take 500-1,000 mg by mouth every 8 (eight) hours as needed for mild pain or headache.   Biotin 1000 MCG CHEW Chew 2 1e11 Vector Genomes by mouth daily.   Cyanocobalamin (B-12 PO) Take 1 tablet by mouth daily.   dapagliflozin propanediol (FARXIGA) 10 MG TABS tablet Take 1 tablet (10 mg total) by mouth daily before breakfast.   ELIQUIS 2.5 MG TABS tablet TAKE ONE TABLET BY MOUTH TWICE A DAY   fluticasone (FLONASE) 50 MCG/ACT nasal spray Place 2 sprays into both nostrils daily as  needed for allergies or rhinitis.   pantoprazole (PROTONIX) 40 MG tablet Take 1 tablet (40 mg total) by mouth daily.   potassium chloride SA (KLOR-CON M) 20 MEQ tablet Take 1 tablet (20 mEq total) by mouth daily.   rosuvastatin (CRESTOR) 40 MG tablet Take 1 tablet (40 mg total) by mouth daily.   torsemide (DEMADEX) 20 MG tablet Take 4 tablets (80 mg total) by mouth daily.   vitamin C (ASCORBIC ACID) 250 MG tablet Take 500 mg by mouth daily.   VITAMIN D PO Take 4,000 Units by mouth daily.   No facility-administered encounter medications on file as of 08/08/2022.     Today's Vitals   08/08/22 1123 08/08/22 1132  BP: (!) 150/44   Pulse: (!) 59   Resp: 18   Temp: (!) 97.5 F (36.4 C)   SpO2: 100%   Weight: 141 lb 6.4 oz (64.1 kg)   PainSc:  3    Body mass index is 22.48 kg/m.   PHYSICAL EXAM GENERAL:alert, no distress and comfortable SKIN: no rash  EYES: sclera clear LUNGS: clear with normal breathing effort HEART: regular rate & rhythm, mild bilateral lower extremity edema ABDOMEN: abdomen soft, non-tender and normal bowel  sounds NEURO: alert & oriented x 3 with fluent speech, exam performed in wheelchair   CBC    Component Value Date/Time   WBC 3.6 (L) 08/08/2022 1109   WBC 3.6 (L) 05/20/2022 1247   RBC 3.64 (L) 08/08/2022 1109   HGB 10.0 (L) 08/08/2022 1109   HGB 8.1 (L) 03/22/2019 1415   HCT 32.0 (L) 08/08/2022 1109   HCT 25.9 (L) 03/22/2019 1415   PLT 175 08/08/2022 1109   PLT 226 03/22/2019 1415   MCV 87.9 08/08/2022 1109   MCV 88 03/22/2019 1415   MCH 27.5 08/08/2022 1109   MCHC 31.3 08/08/2022 1109   RDW 16.8 (H) 08/08/2022 1109   RDW 19.9 (H) 03/22/2019 1415   LYMPHSABS 0.5 (L) 08/08/2022 1109   LYMPHSABS 0.6 (L) 03/22/2019 1415   MONOABS 0.4 08/08/2022 1109   EOSABS 0.0 08/08/2022 1109   EOSABS 0.2 03/22/2019 1415   BASOSABS 0.0 08/08/2022 1109   BASOSABS 0.0 03/22/2019 1415     CMP     Component Value Date/Time   NA 140 08/08/2022 1109   NA 141 07/14/2019 0000   K 4.7 08/08/2022 1109   CL 99 08/08/2022 1109   CO2 34 (H) 08/08/2022 1109   GLUCOSE 91 08/08/2022 1109   BUN 35 (H) 08/08/2022 1109   BUN 29 (A) 07/14/2019 0000   CREATININE 1.47 (H) 08/08/2022 1109   CREATININE 1.34 (H) 02/12/2022 1655   CALCIUM 9.9 08/08/2022 1109   PROT 7.9 08/08/2022 1109   PROT 7.2 06/09/2014 1317   ALBUMIN 4.7 08/08/2022 1109   ALBUMIN 3.8 06/09/2014 1317   AST 16 08/08/2022 1109   ALT 9 08/08/2022 1109   ALT 23 06/09/2014 1317   ALKPHOS 54 08/08/2022 1109   ALKPHOS 71 06/09/2014 1317   BILITOT 0.6 08/08/2022 1109   GFRNONAA 35 (L) 08/08/2022 1109   GFRAA 39 (L) 12/23/2019 1458     ASSESSMENT & PLAN:89 year old female with   Neuroendocrine tumor of pancreatic head, 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    -  Outpatient EUS 03/04/2022 by Dr. Rush Landmark showed a T3N0 mass in the pancreatic head/uncinate process with 2 satellite lesions in the tail; biopsy confirmed neuroendocrine tumor of the pancreatic head mass -We reviewed her case in GI conference, Dr. Zenia Resides did not recommend surgery in multifocal NET and also due to her age and co-morbidities. -She began first line sandostatin injections q28 days on 03/27/22.  -Dotatate PET scan, done 2 weeks after first injection 12/6, shows no significant tracer avidity in the pancrease or distant sites. Findings are likely due to close timing relative to the sandostatin injection blocking receptors. When we repeat this for restaging, will arrange 4 weeks after sandostatin -Ms. Lofgreen appears stable. S/p 4 doses Sandostatin tolerating well, cramping, diarrhea, and flushing have improved.  -While she appears to be doing well from NET standpoint wihtout evidence of progression, some of her chronic conditions are flaring at this time.  -Labs reviewed, adeuqute to proceed with sandostatin today as scheduled; continue monthly -F/up in 4 weeks   Multifactorial anemia, secondary to GI bleed, h/o IDA and B12 deficiency, and anemia of chronic disease -She has a longstanding history of anemia for at least 10 years, likely more; and history of GI bleeding from AVMs -She has received various formulations of IV iron in the past (ferlecit in 2020, Feraheme in 02/2022), B12 injections (last in 2021), and ESA (Retacrit, 2021) -During hospitalization 02/13/22 - 03/01/2022 she had 2 duodenal angioectasias that were treated with APC, 2 doses of Feraheme, and a total of 6 units of red cell transfusions (she had a transfusion reaction and required ICU placement and premeds for subsequent transfusions); melena resolved at discharge -hemoglobin stable in the 9 range; ferritin is high, hold additional iron for now -improved, Hgb 10-10.5 lately    Weakness, deconditioning,  multifactorial   -Prior to her hospitalization in October she was functional at home, cooking, cleaning, and driving locally -after hospital d/c she was more sedentary, requires a walker, and is on 2-3 L of oxygen; she is profoundly deconditioned -She gets PT at home through [facility] -She got stronger after starting sandostatin but for past 2 weeks feels weaker. She attributes to dizziness and fluid retnention.    COVID-19, new oxygen requirement, pleural effusions, h/o COPD -She smoked for 30 years in the past, less than 1 pack/day.  She has COPD but no prior oxygen requirement.  -She underwent therapeutic thoracentesis in the hospital with 800-900 cc removed from each lung; cytology not done -Unclear how much of this is related to the neuroendocrine tumor, possibly recent COVID, or other -She recently developed sinus and R ear infection, on cefdinir per PCP. Improving -On 2 L O2, appreciates portable oxygen tank   Afib, CHF, carotid stenosis, HTN, ICD placement -She is on Eliquis for permanent Afib, cardiology prefers she stay on this for high risk. -We will monitor hgb closely given her bleeding risk on ac.  -recent echo 02/15/2022 showed LVEF 60-65% -leg edema improving on torsemide -She noticed blood on her pillow in the mornings recently, the sources is unclear but most likely epistaxis? I encouraged her to use humidifier in the room and moisten nares with vaseline with nasal cannula -Continue PCP and cardiology follow-up   H/o Renal cell carcinoma and CKD -S/p right nephrectomy in 2003 -Baseline SCr 1.2-1.8 since 2019 -Stable   Social -Patient's 102 year old great grandson lives with her, he is a Airline pilot and assists around the house -Has 2 children, her niece has been present for visits -She has strong  family support   Goals of care -She met with palliative care team while in the hospital to establish relationship and goals of care -She understands that without surgery this  is not curable disease, but still treatable; she is interested in treatment for now; goal is palliative  PLAN: -Labs reviewed -Proceed with Sandostatin today as scheduled, continue q4 weeks -F/up in 4 weeks -Monitor bleeding -Chronic conditions per PCP   All questions were answered. The patient knows to call the clinic with any problems, questions or concerns. No barriers to learning were detected. I spent 20 minutes counseling the patient face to face. The total time spent in the appointment was 30 minutes and more than 50% was on counseling, review of test results, and coordination of care.   Cira Rue, NP-C 08/08/2022

## 2022-08-09 ENCOUNTER — Telehealth: Payer: Self-pay | Admitting: Nurse Practitioner

## 2022-08-09 LAB — CHROMOGRANIN A: Chromogranin A (ng/mL): 72.9 ng/mL (ref 0.0–101.8)

## 2022-08-09 NOTE — Telephone Encounter (Signed)
Called patient per 4/4 los notes to schedule f/u. Patient informed that niece is the one that handles appointments. Called patient's niece but vm was full. Mailing reminders.

## 2022-08-18 DIAGNOSIS — R2689 Other abnormalities of gait and mobility: Secondary | ICD-10-CM | POA: Diagnosis not present

## 2022-08-18 DIAGNOSIS — I5033 Acute on chronic diastolic (congestive) heart failure: Secondary | ICD-10-CM | POA: Diagnosis not present

## 2022-08-18 DIAGNOSIS — J449 Chronic obstructive pulmonary disease, unspecified: Secondary | ICD-10-CM | POA: Diagnosis not present

## 2022-08-18 DIAGNOSIS — M6281 Muscle weakness (generalized): Secondary | ICD-10-CM | POA: Diagnosis not present

## 2022-08-18 DIAGNOSIS — J90 Pleural effusion, not elsewhere classified: Secondary | ICD-10-CM | POA: Diagnosis not present

## 2022-08-18 DIAGNOSIS — Z4789 Encounter for other orthopedic aftercare: Secondary | ICD-10-CM | POA: Diagnosis not present

## 2022-08-19 NOTE — Progress Notes (Signed)
PCP: Ria Bush, MD Cardiology: Dr. Rockey Situ HF Cardiology: Dr. [name]  89 y.o. with history of permanent atrial fibrillation, chronic diastolic CHF, CKD stage 3, smoking/prior COPD, renal cell CA s/p right nephrectomy.  She has a prior history of HFrEF with nonischemic cardiomyoapthy and had a Medtronic ICD placed.  However, subsequently her EF has recovered.  More recently, she has had symptomatic HFpEF.    She has had dyspnea and edema for "a long time," worse starting 1/20. In 6/20, she had left hip ORIF after mechanical fall.  Echo in 6/20 showed EF 60-65%, mild RV dilation with normal systolic function, PASP 93 mmHg.  After she got home from the hospital stay post-ORIF, she noted marked peripheral edema.  She was short of breath walking short distances.  +orthopnea.  She came back to the hospital on 11/04/18 and was admitted with volume overload.  She was diuresed aggressively and lost about 22 lbs.  RHC showed pulmonary venous hypertension.  PCWP tracing had prominent v-waves in absence of significant MR, suggesting significant diastolic dysfunction.   She has been noted to have significant Fe deficiency anemia, but FOBT was negative during 7/20 hospitalization.   She was admitted in 11/20 with symptomatic anemia, GI workup concerning for ischemic bowel.  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 Doren Custard with VVS and conservative management recommended.  Capsule endoscopy showed no definite source of bleeding.  She was volume overloaded due to significant RV dysfunction and diuresed, she also developed AKI on CKD stage 3.      In 10/22, she had bilateral CIA stents placed. ABIs in 11/22 showed patent stents.  She had a skin cancer removed from her left leg. 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   Echo 2/23 EF 65-70% with mild LVH, D-shaped septum with moderate RV enlargement, and mildly decreased RV function, PASP 92,  moderate-severe TR, dilated IVC, moderate aortic stenosis.  Echo in 10/23 showed EF 60-65%, D-shaped septum, mild RV enlargement with normal RV systolic function, PASP 90, moderate-severe TR, mild AS, IVC dilated.   Patient was found to have a neuroendocrine tumor of the pancreatic head in the fall of 2023.  She has been treated with sandostatin monthly.  She also had further GI bleeding from small bowel AVMs in fall 2023.  She also had COVID-19 in 10/23 and has been on home oxygen 2L since that time.   She returns for followup of CHF/RV failure.  She is on a lower dose of torsemide than in the past, 60 mg daily.  Weight is down 18 lbs.  She is off Crestor, not sure why.  Her Cardiomems is pillow is not transmitting.  She does ok walking around her house with her oxygen on.  She gets short of breath walking longer distances.  No chest pain.  No orthopnea/PND.  No lightheadedness.   ECG (personally reviewed): atrial fibrillation, RV-paced  Medtronic device interrogation: Optivol chronically elevated with fluid index > threshold.  85% RV pacing.   Labs (7/20): K 3.7, creatinine 1.44 => 1.33 Labs (12/20): pro-BNP 431, K 4.4, creatinine 1.24, hgb 8.2 Labs (10/21): K 4, creatinine 1.83 Labs (12/21): K 3.5, creatinine 1.45 Labs (2/22): K 5.8 (Lokelma ordered), creatinine 1.39 Labs (3/22): K 4.4  Creatinine 1.42 Labs (8/22): K 4.2, creatinine 1.46, hgb 10.7 Labs (11/22): K 4.5, creatinine 1.69, LFTs normal Labs (1/23): hgb 11, K 4.2, creatinine 1.63, BNP 252 Labs (2/23): K 3.5, creatinine  1.37 Labs (4/23): K 4.0, creatinine 1.38 Labs (1/24): K 4.9, creatinine 1.52, hgb 9.2, LFTs normal  PMH: 1. COPD 2. Renal cell carcinoma: S/p right kidney resection.  3. CKD stage 3 4. Fe deficiency anemia 5. Atrial fibrillation: Permanent. She has failed amiodarone, Tikosyn, and flecainide as well as multiple cardioversions. 6. Chronic diastolic CHF: She has history of prior HFrEF with nonischemic  cardiomyopathy.  She has a Medtronic ICD.   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  - More recently, EF has been in normal range but she has had diastolic CHF.  - Echo (6/20): EF 60-65%, mildly dilated RV with PASP 93 mmHg, mild-moderate TR.  - RHC (7/20): mean RA 11, PA 64/18 mean 39, mean PCWP 24 with v waves to 41, CI 4.21, PVR 1.9 WU.  - Cardiomems placement - RHC (11/20): mean RA 13, PA 63/23 mean 25 with v waves to 47 (MR only mild by echo), mean PCWP 25, CI 3.18, PVR 2.6 WU - Echo (11/21): EF 65-70%, moderate LVH, D-shaped septum with mildly decreased RV systolic function, RVSP 85.9 mmHg, severe RAE, severe TR, mod-sev AS. - Echo (2/23): EF 65-70% with mild LVH, D-shaped septum with moderate RV enlargement, and mildly decreased RV function, PASP 92, moderate-severe TR, dilated IVC, moderate aortic stenosis.  - Echo (10/23): EF 60-65%, D-shaped septum, mild RV enlargement with normal RV systolic function, PASP 90, moderate-severe TR, mild AS, IVC dilated.  7. Hyperlipidemia 8. Carotid stenosis: Carotid dopplers (3/19) with 40-59% LICA stenosis.   9. Possible ischemic bowel in 11/20 10. 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 Conservative management per Dr. Durwin Nora.  - 10/22 bilateral CIA stents.  - 11/22 ABIs: 1.07 left, 0.83 right with patent CIA stents.  11. 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  12. Squamous cell skin cancer.  13. Aortic stenosis: Mild on 10/23 echo.  14. 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  15. COVID 19 in 10/23  Social History   Socioeconomic History   Marital status: Widowed    Spouse name: Not on file   Number of children: 2   Years of education: high school   Highest education level: Not on file  Occupational History   Occupation: Retired    Associate Professor: RETIRED  Tobacco Use   Smoking status: Former    Packs/day: 0.50    Years: 40.00    Additional pack  years: 0.00    Total pack years: 20.00    Types: Cigarettes    Quit date: 05/06/2001    Years since quitting: 21.3   Smokeless tobacco: Never  Vaping Use   Vaping Use: Never used  Substance and Sexual Activity   Alcohol use: No    Alcohol/week: 0.0 standard drinks of alcohol   Drug use: No   Sexual activity: Not Currently  Other Topics Concern   Not on file  Social History Narrative   Would desire CPR   07/11/20   From: the area   Living: with great-grandson - Joselyn Glassman (2003)   Work: retired - clerical      Family: 2 children - Darl Pikes and [place] - 2 grand chlidren - 5 great grand children      Enjoys: spending time with friends - watch move, eat out, spend time      Exercise: walking around the house   Diet: not good, limits red meat, limits salt, low appetite      Safety  Seat belts: Yes    Guns: Yes  and secure   Safe in relationships: Yes    Social Determinants of Health   Financial Resource Strain: Low Risk  (05/28/2022)   Overall Financial Resource Strain (CARDIA)    Difficulty of Paying Living Expenses: Not hard at all  Food Insecurity: No Food Insecurity (05/28/2022)   Hunger Vital Sign    Worried About Running Out of Food in the Last Year: Never true    Ran Out of Food in the Last Year: Never true  Transportation Needs: No Transportation Needs (02/14/2022)   PRAPARE - Administrator, Civil Service (Medical): No    Lack of Transportation (Non-Medical): No  Physical Activity: Inactive (09/24/2021)   Exercise Vital Sign    Days of Exercise per Week: 0 days    Minutes of Exercise per Session: 0 min  Stress: No Stress Concern Present (09/24/2021)   [employer] of Occupational Health - Occupational Stress Questionnaire    Feeling of Stress : Not at all  Social Connections: Not on file  Intimate Partner Violence: Not At Risk (02/14/2022)   Humiliation, Afraid, Rape, and Kick questionnaire    Fear of Current or Ex-Partner: No    Emotionally Abused: No     Physically Abused: No    Sexually Abused: No   Family History  Problem Relation Age of Onset   Heart failure Mother    Early death Father        car accident   Breast cancer Maternal Aunt 49   Breast cancer Cousin    Breast cancer Other    Colon cancer Neg Hx    Esophageal cancer Neg Hx    Liver disease Neg Hx    Pancreatic cancer Neg Hx    Rectal cancer Neg Hx    ROS: All systems reviewed and negative except as per HPI.   Current Outpatient Medications  Medication Sig Dispense Refill   acetaminophen (TYLENOL) 500 MG tablet Take 500-1,000 mg by mouth every 8 (eight) hours as needed for mild pain or headache.     Biotin 1000 MCG CHEW Chew 2 1e11 Vector Genomes by mouth daily.     Cyanocobalamin (B-12 PO) Take 1 tablet by mouth daily.     dapagliflozin propanediol (FARXIGA) 10 MG TABS tablet Take 1 tablet (10 mg total) by mouth daily before breakfast. 30 tablet 11   ELIQUIS 2.5 MG TABS tablet TAKE ONE TABLET BY MOUTH TWICE A DAY 60 tablet 11   fluticasone (FLONASE) 50 MCG/ACT nasal spray Place 2 sprays into both nostrils daily as needed for allergies or rhinitis. 16 g 0   pantoprazole (PROTONIX) 40 MG tablet Take 1 tablet (40 mg total) by mouth daily. 90 tablet 1   potassium chloride SA (KLOR-CON M) 20 MEQ tablet Take 1 tablet (20 mEq total) by mouth daily. 30 tablet 0   rosuvastatin (CRESTOR) 40 MG tablet Take 1 tablet (40 mg total) by mouth daily. 30 tablet 11   torsemide (DEMADEX) 20 MG tablet Take 4 tablets (80 mg total) by mouth daily. 200 tablet 4   vitamin C (ASCORBIC ACID) 250 MG tablet Take 500 mg by mouth daily.     VITAMIN D PO Take 4,000 Units by mouth daily.     No current facility-administered medications for this visit.   Wt Readings from Last 3 Encounters:  08/08/22 64.1 kg (141 lb 6.4 oz)  07/24/22 64.1 kg (141 lb 4 oz)  07/09/22 62.6 kg (137  lb 14.4 oz)   There were no vitals taken for this visit.   PHYSICAL EXAM: General: NAD Neck: JVP 9-10 cm, no  thyromegaly or thyroid nodule.  Lungs: Clear to auscultation bilaterally with normal respiratory effort. CV: Nondisplaced PMI.  Heart irregular S1/S2, no S3/S4, 3/6 SEM RUSB with clear S2.  Trace ankle edema.  No carotid bruit.  Normal pedal pulses.  Abdomen: Soft, nontender, no hepatosplenomegaly, no distention.  Skin: Intact without lesions or rashes.  Neurologic: Alert and oriented x 3.  Psych: Normal affect. Extremities: No clubbing or cyanosis.  HEENT: Normal.   Assessment/Plan: 1. Chronic diastolic CHF: With prominent RV failure.  Echo in 6/20 showed EF 60-65%, mild RV dilation with normal systolic function, PASP 93 mmHg.  She had remote nonischemic cardiomyopathy with recovery of EF, has Medtronic ICD.  I suspect that there is significant RV dysfunction. Atrial fibrillation likely contributes, but this appears to be permanent now as she has failed multiple anti-arrhythmics and is reasonably rate-controlled. RHC 11/20 showed elevated filling pressures and pulmonary venous hypertension.  Prominent v-waves on PCWP tracing likely due to stiff ventricle/diastolic dysfunction as she had only mild MR on echo.  Echo 10/23 with EF 60-65%, D-shaped septum, mild RV enlargement with normal RV systolic function, PASP 90, moderate-severe TR, mild AS, IVC dilated. She is RV pacing at a high percentage, but LV EF has remained normal.  NYHA Class II-III symptoms (chronic), stable. She is volume overloaded on exam.  Unable to use Cardiomems (not transmitting). - She has the number for Cardiomems and will call to see if we can get it working.  - Increase torsemide to 80 mg daily with BMET/BNP today and BMET in 10 days.  - Add Farxiga 10 mg daily.  2. Hypertension: BP controlled.  3. Pulmonary hypertension: PA systolic pressure estimated 90 mmHg on last echo. Based on RHC 11/20, she has primarily pulmonary venous hypertension, not candidate for pulmonary vasodilators.  4. CKD: Stage 3.   - BMET today - Adding  SGLT2 inhibitor as above.  5. GI bleeding: Due to small bowel AVMs.  - She is being followed by Heme/Onc for her anemia.  6. Atrial fibrillation: Permanent now.  Rate control is reasonable. She has failed amiodarone, Tikosyn, and flecainide as well as multiple cardioversions.  - Continue Eliquis 2.5 mg bid (age, creatinine).  7. Aortic stenosis: Mild on last echo.   Follow up 3 wks with APP.   Anderson Malta [facility]  08/19/2022

## 2022-08-21 ENCOUNTER — Encounter (HOSPITAL_COMMUNITY): Payer: Self-pay

## 2022-08-21 ENCOUNTER — Ambulatory Visit (HOSPITAL_COMMUNITY)
Admission: RE | Admit: 2022-08-21 | Discharge: 2022-08-21 | Disposition: A | Discharge status: Home / Self Care | Payer: PPO | Source: Ambulatory Visit | Attending: Family Medicine | Admitting: Family Medicine

## 2022-08-21 VITALS — BP 120/68 | HR 60 | Wt 142.0 lb

## 2022-08-21 DIAGNOSIS — I130 Hypertensive heart and chronic kidney disease with heart failure and stage 1 through stage 4 chronic kidney disease, or unspecified chronic kidney disease: Secondary | ICD-10-CM | POA: Insufficient documentation

## 2022-08-21 DIAGNOSIS — Z85828 Personal history of other malignant neoplasm of skin: Secondary | ICD-10-CM | POA: Insufficient documentation

## 2022-08-21 DIAGNOSIS — D0472 Carcinoma in situ of skin of left lower limb, including hip: Secondary | ICD-10-CM | POA: Insufficient documentation

## 2022-08-21 DIAGNOSIS — Q2733 Arteriovenous malformation of digestive system vessel: Secondary | ICD-10-CM | POA: Diagnosis not present

## 2022-08-21 DIAGNOSIS — I082 Rheumatic disorders of both aortic and tricuspid valves: Secondary | ICD-10-CM | POA: Insufficient documentation

## 2022-08-21 DIAGNOSIS — I4821 Permanent atrial fibrillation: Secondary | ICD-10-CM | POA: Insufficient documentation

## 2022-08-21 DIAGNOSIS — I272 Pulmonary hypertension, unspecified: Secondary | ICD-10-CM | POA: Insufficient documentation

## 2022-08-21 DIAGNOSIS — Z8616 Personal history of COVID-19: Secondary | ICD-10-CM | POA: Diagnosis not present

## 2022-08-21 DIAGNOSIS — I350 Nonrheumatic aortic (valve) stenosis: Secondary | ICD-10-CM

## 2022-08-21 DIAGNOSIS — J449 Chronic obstructive pulmonary disease, unspecified: Secondary | ICD-10-CM | POA: Insufficient documentation

## 2022-08-21 DIAGNOSIS — Z7189 Other specified counseling: Secondary | ICD-10-CM

## 2022-08-21 DIAGNOSIS — I5032 Chronic diastolic (congestive) heart failure: Secondary | ICD-10-CM | POA: Insufficient documentation

## 2022-08-21 DIAGNOSIS — D3A8 Other benign neuroendocrine tumors: Secondary | ICD-10-CM | POA: Diagnosis not present

## 2022-08-21 DIAGNOSIS — I10 Essential (primary) hypertension: Secondary | ICD-10-CM

## 2022-08-21 DIAGNOSIS — Z79899 Other long term (current) drug therapy: Secondary | ICD-10-CM | POA: Diagnosis not present

## 2022-08-21 DIAGNOSIS — Z95818 Presence of other cardiac implants and grafts: Secondary | ICD-10-CM | POA: Insufficient documentation

## 2022-08-21 DIAGNOSIS — Z87891 Personal history of nicotine dependence: Secondary | ICD-10-CM | POA: Insufficient documentation

## 2022-08-21 DIAGNOSIS — Z7984 Long term (current) use of oral hypoglycemic drugs: Secondary | ICD-10-CM | POA: Insufficient documentation

## 2022-08-21 DIAGNOSIS — N183 Chronic kidney disease, stage 3 unspecified: Secondary | ICD-10-CM | POA: Diagnosis not present

## 2022-08-21 DIAGNOSIS — Z66 Do not resuscitate: Secondary | ICD-10-CM | POA: Insufficient documentation

## 2022-08-21 DIAGNOSIS — Z7901 Long term (current) use of anticoagulants: Secondary | ICD-10-CM | POA: Diagnosis not present

## 2022-08-21 DIAGNOSIS — Z85528 Personal history of other malignant neoplasm of kidney: Secondary | ICD-10-CM | POA: Diagnosis not present

## 2022-08-21 DIAGNOSIS — I428 Other cardiomyopathies: Secondary | ICD-10-CM | POA: Diagnosis not present

## 2022-08-21 DIAGNOSIS — Z8719 Personal history of other diseases of the digestive system: Secondary | ICD-10-CM

## 2022-08-21 DIAGNOSIS — Z9981 Dependence on supplemental oxygen: Secondary | ICD-10-CM | POA: Diagnosis not present

## 2022-08-21 DIAGNOSIS — Z4502 Encounter for adjustment and management of automatic implantable cardiac defibrillator: Secondary | ICD-10-CM | POA: Insufficient documentation

## 2022-08-21 DIAGNOSIS — K922 Gastrointestinal hemorrhage, unspecified: Secondary | ICD-10-CM | POA: Diagnosis not present

## 2022-08-21 DIAGNOSIS — D500 Iron deficiency anemia secondary to blood loss (chronic): Secondary | ICD-10-CM | POA: Insufficient documentation

## 2022-08-21 DIAGNOSIS — Z905 Acquired absence of kidney: Secondary | ICD-10-CM | POA: Insufficient documentation

## 2022-08-21 LAB — URINALYSIS, ROUTINE W REFLEX MICROSCOPIC
Bacteria, UA: NONE SEEN
Bilirubin Urine: NEGATIVE
Glucose, UA: NEGATIVE mg/dL
Hgb urine dipstick: NEGATIVE
Ketones, ur: NEGATIVE mg/dL
Nitrite: NEGATIVE
Protein, ur: NEGATIVE mg/dL
Specific Gravity, Urine: 1.016 (ref 1.005–1.030)
pH: 5 (ref 5.0–8.0)

## 2022-08-21 MED ORDER — POTASSIUM CHLORIDE CRYS ER 20 MEQ PO TBCR: 40.0000 meq | EXTENDED_RELEASE_TABLET | Freq: Every day | ORAL | 6 refills | Status: DC

## 2022-08-21 MED ORDER — TORSEMIDE 20 MG PO TABS: 80.0000 mg | ORAL_TABLET | Freq: Two times a day (BID) | ORAL | 6 refills | Status: DC

## 2022-08-21 NOTE — Patient Instructions (Addendum)
Thank you for coming in today  Labs were done today, if any labs are abnormal the clinic will call you No news is good news  Medications: Increase torsemide to 80 mg 4 tablets twice daily  Increase Potassium to 40 meq daily 2 tablet   Follow up appointments:  Your physician recommends that you schedule a follow-up appointment in:  3-4 weeks in clinic  Your physician recommends that you return for lab work in:  10 days BMET at your primary care office the order is in    Do the following things EVERYDAY: Weigh yourself in the morning before breakfast. Write it down and keep it in a log. Take your medicines as prescribed Eat low salt foods--Limit salt (sodium) to 2000 mg per day.  Stay as active as you can everyday Limit all fluids for the day to less than 2 liters   At the [facility], you and your health needs are our priority. As part of our continuing mission to provide you with exceptional heart care, we have created designated Provider Care Teams. These Care Teams include your primary Cardiologist (physician) and Advanced Practice Providers (APPs- Physician Assistants and Nurse Practitioners) who all work together to provide you with the care you need, when you need it.   You may see any of the following providers on your designated Care Team at your next follow up: Dr Arvilla Meres Dr Marca Ancona Dr. Marcos Eke, NP Robbie Lis, [place] [facility] [place], [place] Brynda Peon, NP Karle Plumber, PharmD   Please be sure to bring in all your medications bottles to every appointment.    Thank you for choosing [facility]  If you have any questions or concerns before your next appointment please send [place] a message through [place] or call our office at [phone].    TO LEAVE A MESSAGE FOR THE NURSE SELECT OPTION 2, PLEASE LEAVE A MESSAGE INCLUDING: YOUR NAME DATE OF BIRTH CALL BACK  NUMBER REASON FOR CALL**this is important as we prioritize the call backs  YOU WILL RECEIVE A CALL BACK THE SAME DAY AS LONG AS YOU CALL BEFORE 4:00 PM

## 2022-08-22 LAB — URINE CULTURE: Culture: NO GROWTH

## 2022-08-26 ENCOUNTER — Telehealth (HOSPITAL_COMMUNITY): Payer: Self-pay | Admitting: Cardiology

## 2022-08-26 MED ORDER — NITROFURANTOIN MONOHYD MACRO 100 MG PO CAPS: 100.0000 mg | ORAL_CAPSULE | Freq: Two times a day (BID) | ORAL | 0 refills | Status: AC

## 2022-08-26 NOTE — Telephone Encounter (Signed)
-----   Message from Jacklynn Ganong, [place] sent at 08/23/2022  8:06 AM EDT ----- U/a shows moderate leukocytes, but urine culture shows no growth.  With her u/a symptoms, I think we should start abx Please start nitrofurantoin 100 mg po bid x 5 days.

## 2022-08-26 NOTE — Telephone Encounter (Signed)
Patient called.  Patient aware. a

## 2022-08-27 ENCOUNTER — Ambulatory Visit: Payer: PPO | Admitting: Dermatology

## 2022-08-28 ENCOUNTER — Telehealth: Payer: Self-pay

## 2022-08-28 ENCOUNTER — Ambulatory Visit: Payer: PPO | Attending: Internal Medicine

## 2022-08-28 DIAGNOSIS — I5032 Chronic diastolic (congestive) heart failure: Secondary | ICD-10-CM

## 2022-08-28 DIAGNOSIS — Z95810 Presence of automatic (implantable) cardiac defibrillator: Secondary | ICD-10-CM | POA: Diagnosis not present

## 2022-08-28 NOTE — Progress Notes (Signed)
EPIC Encounter for ICM Monitoring  Patient Name: [name] is a 89 y.o. female Date: 08/28/2022 Primary Care Physican: [name], MD Primary Cardiologist: Shirlee Latch Electrophysiologist: Graciela Husbands       ICM check for HF clinic as requested.  Heart Failure questions reviewed.  Pt reports she is feeling fine but little weak after Torsemide dosage was increased.   Optivol thoracic impedance suggesting fluid levels returned to normal after Torsemide dosage increased to 80 mg bid at 4/17 HF clinic OV.   Prescribed:  Torsemide 20 mg take 4 tablet(s) (80 mg total) by mouth twice a day. Potassium 20 mEq take 2 tablet(s) (40 mEq total) by mouth daily.  Recommendations: Advised HF clinic will notify any changes if needed.  She confirmed she will have labs drawn on 4/29.  Follow-up plan:  No further ICM clinic phone appointments scheduled.   91 day device clinic remote transmission 09/09/2022.    EP/Cardiology Office Visits: 09/16/2022 with HF Clinic PA/NP.    Copy of ICM check sent to Dr. Graciela Husbands.   3 month ICM trend: 08/28/2022.    12-14 Month ICM trend:     Karie Soda, RN 08/28/2022 11:16 AM

## 2022-08-28 NOTE — Telephone Encounter (Signed)
Spoke with patient and assisted with manual transmission.  See ICM Note for results.

## 2022-08-28 NOTE — Telephone Encounter (Signed)
Spoke with patient and advised Prince Rome, NP at HF clinic would like to have a remote transmission sent from home today to review fluid levels.  Advised the monitor is showing disconnected.  She said she will make sure the monitor is plugged in and requested a call back within the hour for assistance with manual transmission.

## 2022-09-02 ENCOUNTER — Ambulatory Visit (INDEPENDENT_AMBULATORY_CARE_PROVIDER_SITE_OTHER): Payer: PPO | Admitting: Family Medicine

## 2022-09-02 ENCOUNTER — Encounter: Payer: Self-pay | Admitting: Family Medicine

## 2022-09-02 ENCOUNTER — Other Ambulatory Visit: Payer: Self-pay | Admitting: Family Medicine

## 2022-09-02 ENCOUNTER — Other Ambulatory Visit: Payer: PPO

## 2022-09-02 VITALS — BP 120/60 | HR 60 | Temp 97.8°F | Ht 66.5 in | Wt 135.1 lb

## 2022-09-02 DIAGNOSIS — N1832 Chronic kidney disease, stage 3b: Secondary | ICD-10-CM | POA: Diagnosis not present

## 2022-09-02 DIAGNOSIS — I5032 Chronic diastolic (congestive) heart failure: Secondary | ICD-10-CM | POA: Diagnosis not present

## 2022-09-02 DIAGNOSIS — B029 Zoster without complications: Secondary | ICD-10-CM

## 2022-09-02 LAB — BASIC METABOLIC PANEL
BUN: 44 mg/dL — ABNORMAL HIGH (ref 6–23)
CO2: 32 mEq/L (ref 19–32)
Calcium: 9.6 mg/dL (ref 8.4–10.5)
Chloride: 96 mEq/L (ref 96–112)
Creatinine, Ser: 1.69 mg/dL — ABNORMAL HIGH (ref 0.40–1.20)
GFR: 27.18 mL/min — ABNORMAL LOW (ref >60.00)
Glucose, Bld: 93 mg/dL (ref 70–99)
Potassium: 3.9 mEq/L (ref 3.5–5.1)
Sodium: 138 mEq/L (ref 135–145)

## 2022-09-02 MED ORDER — VALACYCLOVIR HCL 1 G PO TABS: 1000.0000 mg | ORAL_TABLET | Freq: Every day | ORAL | 0 refills | Status: DC

## 2022-09-02 NOTE — Assessment & Plan Note (Addendum)
Lab today for Dr Jearld Pies  Clinically stable Rev last note/lab Taking torsemide

## 2022-09-02 NOTE — Progress Notes (Signed)
Subjective:    Patient ID: ACSA ESTEY, female    DOB: Sep 17, 1933, 89 y.o.   MRN: [id number]  HPI Pt presents for rash (? Shingles) in hip and groin area  89 yo pt of Dr Reece Agar  Also needs labs today for Dr Shirlee Latch for her CHF Bmet order is in the chart    Wt Readings from Last 3 Encounters:  09/02/22 135 lb 2 oz (61.3 kg)  08/21/22 142 lb (64.4 kg)  08/08/22 141 lb 6.4 oz (64.1 kg)   21.48 kg/m  Vitals:   09/02/22 1231 09/02/22 1306  BP: (!) 146/60 120/60  Pulse: 60   Temp: 97.8 F (36.6 C)   SpO2: 100%     Had zostavax in 2010   Pain in R lateral leg to hip and in groin area  Then a few spots developed / blister rash   H/o CKD GFR of 35 most recently   Crcl 26.56  Cannot take any pain medications at all (she does not tolerate)   Patient Active Problem List   Diagnosis Date Noted   Herpes zoster 09/02/2022   Decreased hearing of right ear 07/25/2022   DNR (do not resuscitate) 07/25/2022   Bilateral impacted cerumen 05/20/2022   Respiratory failure (HCC) 04/03/2022   Neuroendocrine tumor of pancreas 03/20/2022   Upper GI bleed 02/13/2022   Osteoporosis 10/11/2021   Squamous cell carcinoma of foot, 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   Acute gout due to renal impairment involving left foot 01/30/2021   Vertigo 12/05/2020   History of blood transfusion reaction 04/17/2020   AVM (arteriovenous malformation) of small bowel, acquired with hemorrhage    Gastric polyps    Hypothyroidism 03/27/2020   Palliative care by specialist    Advanced directives, counseling/discussion    Anemia 01/06/2020   History of GI bleed 11/11/2019   Chronic venous hypertension w ulceration (HCC)    Chronic bilateral pleural effusions 11/04/2018   Iliac artery stenosis, bilateral (HCC) 07/14/2017   Pulmonary hypertension (HCC) 12/04/2015   B12 deficiency 05/22/2015   PAD (peripheral artery disease) (HCC)    History of nephrectomy secondary to RCC    TIA (transient ischemic attack) 08/16/2014    Chronic fatigue 01/14/2014   Chronic diastolic CHF (congestive heart failure) (HCC) 06/04/2013   Mitral valve regurgitation 06/04/2013   Allergic rhinitis 01/01/2013   Chronic cholecystitis with calculus 09/15/2012   Claudication (HCC) 01/21/2012   Lightheadedness 11/26/2011   Hypertension 05/20/2011   HOCM/IHSS 07/14/2009   Mixed hyperlipidemia 04/10/2009   INCONTINENCE, URGE 04/10/2009   Non-ischemic cardiomyopathy (HCC) 10/27/2008   Permanent atrial fibrillation 10/27/2008   SINUS BRADYCARDIA 10/27/2008   Carotid artery stenosis 10/27/2008   COPD (chronic obstructive pulmonary disease) (HCC) 10/27/2008   Chronic kidney disease, stage 3b (HCC) 10/27/2008   ICD (implantable cardioverter-defibrillator) in place 10/27/2008   Past Medical History:  Diagnosis Date   Actinic keratosis 01/17/2015   R forearm   Adjustment disorder with anxiety    Anemia    Anginal pain (HCC)    Arthritis    "some in my hands" (11/11/2012)   Atrial fibrillation (HCC)    Atypical mole 03/25/2018   L forearm - severe   Automatic implantable cardioverter-defibrillator in situ    Avascular necrosis of hip (HCC) 05/03/2011   Blood in stool    Carotid artery stenosis 09/2007   a. 09/2007: 60-79% bilateral (stable); b. 10/2008: 40-59% R 60-79%    Cellulitis of left lower extremity 07/13/2020   Chest pain, unspecified  Chronic airway obstruction, not elsewhere classified    Chronic bronchitis (HCC)    "get it some; not q year" (11/11/2012)   Chronic diastolic CHF (congestive heart failure) (HCC) 06/04/2013   Chronic kidney disease, unspecified    Coronary artery disease    non-obstructive by 2006 cath   COVID-19 virus infection 02/12/2022   Displaced fracture of left femoral neck (HCC) 10/09/2018   Frequent UTI    "get them a couple times/yr" (11/11/2012)   GI bleed 03/28/2020   Hemorrhage of rectum and anus    High cholesterol    History of blood transfusion 04/2011   "after hip OR" (11/11/2012)    Hx of renal cell cancer    Hypertension 05/20/2011   Hypertr obst cardiomyop    Hypotension, unspecified    cardiac cath 2006..nonobstructive CAD 30-40s lesions.Marland KitchenETT 1/09 nondiagnostic due to poor HR response..Right Renal Cancer 2003   Long term (current) use of anticoagulants    Malignant neoplasm of kidney, except pelvis    Osteoarthritis of right hip    Other and unspecified coagulation defects    PONV (postoperative nausea and vomiting)    Presence of permanent cardiac pacemaker    RECTAL BLEEDING 10/13/2009   Qualifier: Diagnosis of  By: Wilmon Pali NP, Paula     Renal cancer (HCC) 06/2001   Right   Secondary cardiomyopathy, unspecified    Sinus bradycardia    Squamous cell carcinoma of skin 01/17/2015   R lat wrist   Squamous cell carcinoma of skin 01/29/2018   R post upper leg - superficially invasive   Squamous cell carcinoma of skin 03/25/2018   L lat foot   Squamous cell carcinoma of skin 11/09/2020   left lat foot - EDC 01/01/21, recurrent 01/31/21 - MOHs 02/22/21   Squamous cell carcinoma of skin 12/19/2021   Right Posterior Medial Thigh, EDC   Squamous cell carcinoma of skin 12/19/2021   SCC IS, L lat heel, EDC 02/04/2022   Squamous cell carcinoma of skin 01/21/2022   L forearm, EDC 02/04/2022   Squamous cell carcinoma of skin 01/21/2022   R lower leg below knee, EDC 02/04/2022   Squamous cell carcinoma of skin 01/21/2022   SCCIS, R post heel, EDC 02/04/2022   Squamous cell carcinoma of skin 07/01/2022   left lower abdomen, in situ, EDC   Squamous cell carcinoma of skin 07/01/2022   left medial chest, EDC   Urge incontinence    Past Surgical History:  Procedure Laterality Date   ABDOMINAL AORTOGRAM W/LOWER EXTREMITY N/A 02/12/2021   Procedure: ABDOMINAL AORTOGRAM W/LOWER EXTREMITY;  Surgeon: Maeola Harman, MD;  Location: [facility] INVASIVE CV LAB;  Service: Cardiovascular;  Laterality: N/A;   ABDOMINAL HYSTERECTOMY  1975   for benign causes   APPENDECTOMY      BI-VENTRICULAR PACEMAKER UPGRADE  05/04/2010   BIOPSY  02/28/2019   Procedure: BIOPSY;  Surgeon: Rachael Fee, MD;  Location: [facility] ENDOSCOPY;  Service: Endoscopy;;   BIOPSY  03/04/2022   Procedure: BIOPSY;  Surgeon: Lemar Lofty., MD;  Location: [facility] ENDOSCOPY;  Service: Gastroenterology;;   CARDIAC CATHETERIZATION  2006   CARDIOVERSION N/A 02/20/2018   Procedure: CARDIOVERSION;  Surgeon: Antonieta Iba, MD;  Location: [facility] ORS;  Service: Cardiovascular;  Laterality: N/A;   CARDIOVERSION N/A 03/27/2018   Procedure: CARDIOVERSION;  Surgeon: Antonieta Iba, MD;  Location: [facility] ORS;  Service: Cardiovascular;  Laterality: N/A;   CATARACT EXTRACTION W/ INTRAOCULAR LENS  IMPLANT, BILATERAL  01/2006-02-2006   CHOLECYSTECTOMY N/A 11/11/2012  Procedure: LAPAROSCOPIC CHOLECYSTECTOMY WITH INTRAOPERATIVE CHOLANGIOGRAM;  Surgeon: Wilmon Arms. Corliss Skains, MD;  Location: [facility] OR;  Service: General;  Laterality: N/A;   COLONOSCOPY WITH PROPOFOL N/A 02/28/2019   Procedure: COLONOSCOPY WITH PROPOFOL;  Surgeon: Rachael Fee, MD;  Location: [facility] ENDOSCOPY;  Service: Endoscopy;  Laterality: N/A;   ENTEROSCOPY N/A 03/30/2020   Procedure: ENTEROSCOPY;  Surgeon: Shellia Cleverly, DO;  Location: [facility] ENDOSCOPY;  Service: Gastroenterology;  Laterality: N/A;   ENTEROSCOPY N/A 02/10/2021   Procedure: ENTEROSCOPY;  Surgeon: Jeani Hawking, MD;  Location: [facility] ENDOSCOPY;  Service: Endoscopy;  Laterality: N/A;   ENTEROSCOPY N/A 02/15/2022   Procedure: ENTEROSCOPY;  Surgeon: Imogene Burn, MD;  Location: [facility] ENDOSCOPY;  Service: Gastroenterology;  Laterality: N/A;   EP IMPLANTABLE DEVICE N/A 02/21/2016   Procedure: ICD Generator Changeout;  Surgeon: Duke Salvia, MD;  Location: [facility] INVASIVE CV LAB;  Service: Cardiovascular;  Laterality: N/A;   ESOPHAGOGASTRODUODENOSCOPY N/A 03/04/2022   Procedure: ESOPHAGOGASTRODUODENOSCOPY (EGD);  Surgeon: Lemar Lofty., MD;  Location: [facility] ENDOSCOPY;  Service:  Gastroenterology;  Laterality: N/A;   ESOPHAGOGASTRODUODENOSCOPY (EGD) WITH PROPOFOL N/A 02/28/2019   Procedure: ESOPHAGOGASTRODUODENOSCOPY (EGD) WITH PROPOFOL;  Surgeon: Rachael Fee, MD;  Location: [facility] ENDOSCOPY;  Service: Endoscopy;  Laterality: N/A;   EUS N/A 03/04/2022   Procedure: UPPER ENDOSCOPIC ULTRASOUND (EUS) RADIAL;  Surgeon: Lemar Lofty., MD;  Location: [facility] ENDOSCOPY;  Service: Gastroenterology;  Laterality: N/A;   FINE NEEDLE ASPIRATION N/A 03/04/2022   Procedure: FINE NEEDLE ASPIRATION (FNA) LINEAR;  Surgeon: Lemar Lofty., MD;  Location: [facility] ENDOSCOPY;  Service: Gastroenterology;  Laterality: N/A;   GIVENS CAPSULE STUDY N/A 03/15/2019   Procedure: GIVENS CAPSULE STUDY;  Surgeon: Tressia Danas, MD;  Location: [facility] ENDOSCOPY;  Service: Gastroenterology;  Laterality: N/A;   HOT HEMOSTASIS N/A 03/30/2020   Procedure: HOT HEMOSTASIS (ARGON PLASMA COAGULATION/BICAP);  Surgeon: Shellia Cleverly, DO;  Location: [facility] ENDOSCOPY;  Service: Gastroenterology;  Laterality: N/A;   HOT HEMOSTASIS N/A 02/10/2021   Procedure: HOT HEMOSTASIS (ARGON PLASMA COAGULATION/BICAP);  Surgeon: Jeani Hawking, MD;  Location: [facility] ENDOSCOPY;  Service: Endoscopy;  Laterality: N/A;   HOT HEMOSTASIS N/A 02/15/2022   Procedure: HOT HEMOSTASIS (ARGON PLASMA COAGULATION/BICAP);  Surgeon: Imogene Burn, MD;  Location: [facility] ENDOSCOPY;  Service: Gastroenterology;  Laterality: N/A;   INSERT / REPLACE / REMOVE PACEMAKER  05-01-11   02-28-05-/05-04-10-ICD-MEDTRONIC MAXIMAL DR   IR ANGIOGRAM SELECTIVE EACH ADDITIONAL VESSEL  02/18/2022   IR ANGIOGRAM SELECTIVE EACH ADDITIONAL VESSEL  02/18/2022   IR ANGIOGRAM VISCERAL SELECTIVE  02/16/2022   IR ANGIOGRAM VISCERAL SELECTIVE  02/16/2022   IR EMBO ART  VEN HEMORR LYMPH EXTRAV  INC GUIDE ROADMAPPING  02/16/2022   IR THORACENTESIS ASP PLEURAL SPACE W/IMG GUIDE  02/21/2022   IR THORACENTESIS ASP PLEURAL SPACE W/IMG GUIDE  02/22/2022   IR US GUIDE VASC  ACCESS RIGHT  02/16/2022   JOINT REPLACEMENT     LAPAROSCOPIC CHOLECYSTECTOMY  11/11/2012   LAPAROSCOPIC LYSIS OF ADHESIONS N/A 11/11/2012   Procedure: LAPAROSCOPIC LYSIS OF ADHESIONS;  Surgeon: Wilmon Arms. Corliss Skains, MD;  Location: [facility] OR;  Service: General;  Laterality: N/A;   NEPHRECTOMY Right 06/2001    S/P RENAL CELL CANCER   PERIPHERAL VASCULAR INTERVENTION Bilateral 02/12/2021   Procedure: PERIPHERAL VASCULAR INTERVENTION;  Surgeon: Maeola Harman, MD;  Location: [facility] INVASIVE CV LAB;  Service: Cardiovascular;  Laterality: Bilateral;  Iliac artery stents   PRESSURE SENSOR/CARDIOMEMS N/A 02/03/2019   Procedure: PRESSURE SENSOR/CARDIOMEMS;  Surgeon: Laurey Morale, MD;  Location:  [facility] INVASIVE CV LAB;  Service: Cardiovascular;  Laterality: N/A;   RIGHT HEART CATH N/A 11/09/2018   Procedure: RIGHT HEART CATH;  Surgeon: Laurey Morale, MD;  Location: [facility] INVASIVE CV LAB;  Service: Cardiovascular;  Laterality: N/A;   RIGHT HEART CATH N/A 03/08/2019   Procedure: RIGHT HEART CATH;  Surgeon: Laurey Morale, MD;  Location: [facility] INVASIVE CV LAB;  Service: Cardiovascular;  Laterality: N/A;   SUBMUCOSAL TATTOO INJECTION  02/28/2019   Procedure: SUBMUCOSAL TATTOO INJECTION;  Surgeon: Rachael Fee, MD;  Location: [facility] ENDOSCOPY;  Service: Endoscopy;;   TOTAL HIP ARTHROPLASTY Right 05/03/2011   Procedure: TOTAL HIP ARTHROPLASTY ANTERIOR APPROACH;  Surgeon: Kathryne Hitch;  Location: [facility] ORS;  Service: Orthopedics;  Laterality: Right;  Removal of Cannulated Screws Right Hip, Right Direct Anterior Hip Replacement   TOTAL HIP ARTHROPLASTY Left 10/09/2018   Procedure: TOTAL HIP ARTHROPLASTY ANTERIOR APPROACH;  Surgeon: Samson Frederic, MD;  Location: [facility] OR;  Service: Orthopedics;  Laterality: Left;   Social History   Tobacco Use   Smoking status: Former    Packs/day: 0.50    Years: 40.00    Additional pack years: 0.00    Total pack years: 20.00    Types: Cigarettes    Quit date: 05/06/2001     Years since quitting: 21.3   Smokeless tobacco: Never  Vaping Use   Vaping Use: Never used  Substance Use Topics   Alcohol use: No    Alcohol/week: 0.0 standard drinks of alcohol   Drug use: No   Family History  Problem Relation Age of Onset   Heart failure Mother    Early death Father        car accident   Breast cancer Maternal Aunt 22   Breast cancer Cousin    Breast cancer Other    Colon cancer Neg Hx    Esophageal cancer Neg Hx    Liver disease Neg Hx    Pancreatic cancer Neg Hx    Rectal cancer Neg Hx    Allergies  Allergen Reactions   Dilaudid [Hydromorphone] Other (See Comments)    Excessive Somnolence- Required Narcan   Fentanyl Other (See Comments)    Required Narcan when given with Dilaudid. Tolerated single dose when given during procedure 02/03/2019.    Codeine Nausea And Vomiting    Hallucinations    Morphine And Related Nausea And Vomiting    Hallucinations    Amoxicillin Nausea Only   Hydrocodone Nausea Only and Other (See Comments)    Nausea and lethargy   Oxycodone Nausea Only and Other (See Comments)    Nausea and lethargy   Tramadol Nausea Only and Other (See Comments)    Nausea and lethargy   Whole Blood Itching and Nausea And Vomiting    Pt and family stated pt will receive blood products but had a reaction requiring benadryl on a previous administration.  Needs premedication with tylenol, benadryl, steroids for future transfusions    Sulfonamide Derivatives Other (See Comments)    Dry mouth   Current Outpatient Medications on File Prior to Visit  Medication Sig Dispense Refill   acetaminophen (TYLENOL) 500 MG tablet Take 500-1,000 mg by mouth every 8 (eight) hours as needed for mild pain or headache.     Biotin 1000 MCG CHEW Chew 2 1e11 Vector Genomes by mouth daily.     Cyanocobalamin (B-12 PO) Take 1 tablet by mouth daily.     dapagliflozin propanediol (FARXIGA) 10 MG TABS tablet Take 1 tablet (  10 mg total) by mouth daily before breakfast. 30  tablet 11   ELIQUIS 2.5 MG TABS tablet TAKE ONE TABLET BY MOUTH TWICE A DAY 60 tablet 11   fluticasone (FLONASE) 50 MCG/ACT nasal spray Place 2 sprays into both nostrils daily as needed for allergies or rhinitis. 16 g 0   pantoprazole (PROTONIX) 40 MG tablet Take 1 tablet (40 mg total) by mouth daily. 90 tablet 1   potassium chloride SA (KLOR-CON M) 20 MEQ tablet Take 2 tablets (40 mEq total) by mouth daily. 60 tablet 6   rosuvastatin (CRESTOR) 40 MG tablet Take 1 tablet (40 mg total) by mouth daily. 30 tablet 11   torsemide (DEMADEX) 20 MG tablet Take 4 tablets (80 mg total) by mouth 2 (two) times daily. 240 tablet 6   vitamin C (ASCORBIC ACID) 250 MG tablet Take 500 mg by mouth daily.     VITAMIN D PO Take 4,000 Units by mouth daily.     No current facility-administered medications on file prior to visit.    Review of Systems  Constitutional:  Positive for fatigue. Negative for appetite change, chills and fever.  HENT:  Negative for mouth sores and sore throat.   Respiratory:  Positive for shortness of breath.        Chronic  02 dependent  Musculoskeletal:  Positive for arthralgias, back pain and myalgias. Negative for joint swelling.  Skin:  Positive for rash.  Neurological:  Negative for headaches.       Objective:   Physical Exam Constitutional:      General: She is not in acute distress.    Appearance: Normal appearance. She is normal weight. She is not ill-appearing.     Comments: Slim Frail appearing   02 per [place]  HENT:     Head: Normocephalic and atraumatic.  Eyes:     Conjunctiva/sclera: Conjunctivae normal.     Pupils: Pupils are equal, round, and reactive to light.  Cardiovascular:     Rate and Rhythm: Normal rate.  Pulmonary:     Effort: Pulmonary effort is normal. No respiratory distress.     Breath sounds: No wheezing.  Lymphadenopathy:     Cervical: No cervical adenopathy.  Skin:    Comments: Vesicular rash with erythema over L4-5 dermatomal distribution  of R hip and inner thigh (none on lower leg)  Mildly tender One unroofed on buttock area/no bleeding or drainage  Tender to the touch   Neurological:     Mental Status: She is alert.           Assessment & Plan:   Problem List Items Addressed This Visit       Cardiovascular and Mediastinum   Chronic diastolic CHF (congestive heart failure) (HCC)    Lab today for Dr Jearld Pies  Clinically stable Rev last note/lab Taking torsemide        Nervous and Auditory   Herpes zoster - Primary    New - vesicular rash in L4-5 dermatome primarily  On R hip/buttock and inner thigh/near labia  Pain is moderate (pt has h/o chronic pain  She takes tylenol- cannot tolerate any other pain meds  She thinks she tried gabapentin in hosp once and did not tolerate it either  Has had zostavax but not shingrix vaccine   Disc local care- soap/ water//gentle cool compress if needed  I would avoid pain patches until vesicles are healed  Valtrex dosed for Crcl of 26.5   1 g daily for 7 d (can  crush and take with food since she does not swallow pills) Inst to call if symptoms worsen Disc ER precautions  Update if not starting to improve in a week or if worsening   F/u with pcp late this week or early next        Relevant Medications   valACYclovir (VALTREX) 1000 MG tablet   Other Relevant Orders   Basic metabolic panel     Genitourinary   Chronic kidney disease, stage 3b (HCC)    Reviewed last labs  Last GFR of 35 Calc crcl of 26.56 Will renal dose her valtrex for zoster       Other Visit Diagnoses     Chronic diastolic heart failure (HCC)       Relevant Orders   Basic metabolic panel

## 2022-09-02 NOTE — Patient Instructions (Signed)
Tylenol is fine for pain as needed    You can use a gentle cool compress on the rash if you tolerate it  Keep clean with gentle soap and water   Take the valtrex (crush it) one pill daily and take it with food   Follow up with Dr Reece Agar late this week or next week for a re check   If pain worsens please call and let us know

## 2022-09-02 NOTE — Assessment & Plan Note (Addendum)
New - vesicular rash in L4-5 dermatome primarily  On R hip/buttock and inner thigh/near labia  Pain is moderate (pt has h/o chronic pain  She takes tylenol- cannot tolerate any other pain meds  She thinks she tried gabapentin in hosp once and did not tolerate it either  Has had zostavax but not shingrix vaccine   Disc local care- soap/ water//gentle cool compress if needed  I would avoid pain patches until vesicles are healed  Valtrex dosed for Crcl of 26.5   1 g daily for 7 d (can crush and take with food since she does not swallow pills) Inst to call if symptoms worsen Disc ER precautions  Update if not starting to improve in a week or if worsening   F/u with pcp late this week or early next

## 2022-09-02 NOTE — Assessment & Plan Note (Signed)
Reviewed last labs  Last GFR of 35 Calc crcl of 26.56 Will renal dose her valtrex for zoster

## 2022-09-03 ENCOUNTER — Telehealth: Payer: Self-pay | Admitting: Hematology

## 2022-09-03 ENCOUNTER — Other Ambulatory Visit: Payer: Self-pay

## 2022-09-03 ENCOUNTER — Telehealth: Payer: Self-pay

## 2022-09-03 DIAGNOSIS — R2689 Other abnormalities of gait and mobility: Secondary | ICD-10-CM | POA: Diagnosis not present

## 2022-09-03 DIAGNOSIS — M6281 Muscle weakness (generalized): Secondary | ICD-10-CM | POA: Diagnosis not present

## 2022-09-03 DIAGNOSIS — Z4789 Encounter for other orthopedic aftercare: Secondary | ICD-10-CM | POA: Diagnosis not present

## 2022-09-03 DIAGNOSIS — I5033 Acute on chronic diastolic (congestive) heart failure: Secondary | ICD-10-CM | POA: Diagnosis not present

## 2022-09-03 DIAGNOSIS — K8689 Other specified diseases of pancreas: Secondary | ICD-10-CM | POA: Diagnosis not present

## 2022-09-03 DIAGNOSIS — J90 Pleural effusion, not elsewhere classified: Secondary | ICD-10-CM | POA: Diagnosis not present

## 2022-09-03 DIAGNOSIS — J449 Chronic obstructive pulmonary disease, unspecified: Secondary | ICD-10-CM | POA: Diagnosis not present

## 2022-09-03 NOTE — Telephone Encounter (Signed)
Pt called stating that she was diagnosed yesterday 09/02/2022 with shingles and is scheduled to come in on 09/05/2022 for lab, f/u w/Lacie Azucena Cecil, NP, and Octreotide Injection.  Pt wanted to know could she reschedule her appts.  Informed pt "Yes" she can reschedule her appts and this RN will have Lacie Burton's scheduler to give her a call to reschedule her appts.

## 2022-09-03 NOTE — Telephone Encounter (Signed)
patient called to speak with a nurse she has shingles and wasnt sure if she should still come in for her appointments.

## 2022-09-04 ENCOUNTER — Telehealth: Payer: Self-pay | Admitting: Hematology

## 2022-09-04 NOTE — Telephone Encounter (Signed)
Contacted patient to scheduled appointments. Patient is aware of appointments that are scheduled.   

## 2022-09-05 ENCOUNTER — Inpatient Hospital Stay: Payer: PPO

## 2022-09-05 ENCOUNTER — Inpatient Hospital Stay: Payer: PPO | Admitting: Hematology

## 2022-09-09 ENCOUNTER — Ambulatory Visit (INDEPENDENT_AMBULATORY_CARE_PROVIDER_SITE_OTHER): Payer: PPO

## 2022-09-09 DIAGNOSIS — I428 Other cardiomyopathies: Secondary | ICD-10-CM

## 2022-09-09 LAB — CUP PACEART REMOTE DEVICE CHECK
Battery Remaining Longevity: 21 mo
Battery Voltage: 2.92 V
Brady Statistic AP VP Percent: 0 %
Brady Statistic AP VS Percent: 0 %
Brady Statistic AS VP Percent: 0 %
Brady Statistic AS VS Percent: 0 %
Brady Statistic RA Percent Paced: 0 %
Brady Statistic RV Percent Paced: 90.85 %
Date Time Interrogation Session: 20240506033424
HighPow Impedance: 41 Ohm
HighPow Impedance: 56 Ohm
Implantable Lead Connection Status: 753985
Implantable Lead Connection Status: 753985
Implantable Lead Implant Date: 20061026
Implantable Lead Implant Date: 20111230
Implantable Lead Location: 753859
Implantable Lead Location: 753860
Implantable Lead Model: 5076
Implantable Lead Model: 6947
Implantable Pulse Generator Implant Date: 20171018
Lead Channel Impedance Value: 361 Ohm
Lead Channel Impedance Value: 456 Ohm
Lead Channel Impedance Value: 456 Ohm
Lead Channel Pacing Threshold Amplitude: 0.5 V
Lead Channel Pacing Threshold Pulse Width: 0.4 ms
Lead Channel Sensing Intrinsic Amplitude: 0.25 mV
Lead Channel Sensing Intrinsic Amplitude: 0.375 mV
Lead Channel Sensing Intrinsic Amplitude: 10.375 mV
Lead Channel Sensing Intrinsic Amplitude: 10.375 mV
Lead Channel Setting Pacing Amplitude: 2.5 V
Lead Channel Setting Pacing Pulse Width: 0.4 ms
Lead Channel Setting Sensing Sensitivity: 0.3 mV
Zone Setting Status: 755011

## 2022-09-11 ENCOUNTER — Encounter: Payer: Self-pay | Admitting: Family Medicine

## 2022-09-11 ENCOUNTER — Ambulatory Visit (INDEPENDENT_AMBULATORY_CARE_PROVIDER_SITE_OTHER): Payer: PPO | Admitting: Family Medicine

## 2022-09-11 VITALS — BP 138/64 | HR 60 | Temp 97.4°F | Ht 66.5 in | Wt 134.0 lb

## 2022-09-11 DIAGNOSIS — I5032 Chronic diastolic (congestive) heart failure: Secondary | ICD-10-CM | POA: Diagnosis not present

## 2022-09-11 DIAGNOSIS — N1832 Chronic kidney disease, stage 3b: Secondary | ICD-10-CM | POA: Diagnosis not present

## 2022-09-11 DIAGNOSIS — B029 Zoster without complications: Secondary | ICD-10-CM

## 2022-09-11 MED ORDER — GABAPENTIN 100 MG PO CAPS: ORAL_CAPSULE | ORAL | 1 refills | Status: DC

## 2022-09-11 NOTE — Assessment & Plan Note (Signed)
Torsemide increased to 80mg  BID last month, with recent GFR dropping from 35 to 27.  Seems clinically euvolemic today.  Recommend drop torsemide back to 80mg  once daily in am for 3 days then return to BID dosing. Drop potassium accordingly  Defer to CHF clinic scheduled on Monday for further recommendations

## 2022-09-11 NOTE — Assessment & Plan Note (Signed)
Rash to right lower back into buttock/groin continues drying up.  She is having significant neuropathic pain associated with shingles, tylenol up to 4gm ineffective for pain control.  Recommend limit tylenol to 3gm/day.  Discussed neuropathic pain meds gabapentin and lyrica. Will Rx gabapentin 100mg  nightly with option for slow titration as discussed as tolerated. Reviewed side effects to watch for.  Avoid stronger pain medications due to significant side effects when previously tried (see allergy list).  Update with effect.

## 2022-09-11 NOTE — Progress Notes (Signed)
Ph: [phone]       Fax: [phone]   Patient ID: [name], female    DOB: 12/08/1933, 89 y.o.   MRN: [id number]  This visit was conducted in person.  BP 138/64   Pulse 60   Temp (!) 97.4 F (36.3 C) (Temporal)   Ht 5' 6.5" (1.689 m)   Wt 134 lb (60.8 kg)   SpO2 96% Comment: 2 L  BMI 21.30 kg/m    CC: shingles f/u visit  Subjective:   HPI: [name] is a 89 y.o. female presenting on 09/11/2022 for Herpes Zoster (Here for f/u. Seen recently for shingles. C/o still having rash. Pt accompanied by daughter, Lynden Ang. )   Seen last week by Dr Milinda Antis with diagnosis of shingles to right hip and inner thigh region (L4-5 dermatomal distribution). Treated with 1 wk valtrex course (1gm daily renally dosed). Rash is drying up. Pain is burning and severe 8/10 at its worse, describes R lower back with radiation to anterior upper thigh and groin. Also notes numbness distal to rash which is new. Yesterday symptoms again started worsening. No new blisters.   Also having significant leg cramping to bilateral legs. No recent falls or injury. No shooting pain or numbness past the knee. She does have chronic neuropathy.   Pain control - taking tylenol 1000mg  QID. H/o intolerances to previously tried pain medications, even gabapentin.   Worsening kidney function GFR down to 27 when last checked, in setting of torsemide increase to 80mg  BID by cardiology.      Relevant past medical, surgical, family and social history reviewed and updated as indicated. Interim medical history since our last visit reviewed. Allergies and medications reviewed and updated. Outpatient Medications Prior to Visit  Medication Sig Dispense Refill   acetaminophen (TYLENOL) 500 MG tablet Take 500-1,000 mg by mouth every 8 (eight) hours as needed for mild pain or headache.     Biotin 1000 MCG CHEW Chew 2 1e11 Vector Genomes by mouth daily.     Cyanocobalamin (B-12 PO) Take 1 tablet by mouth daily.      dapagliflozin propanediol (FARXIGA) 10 MG TABS tablet Take 1 tablet (10 mg total) by mouth daily before breakfast. 30 tablet 11   ELIQUIS 2.5 MG TABS tablet TAKE ONE TABLET BY MOUTH TWICE A DAY 60 tablet 11   fluticasone (FLONASE) 50 MCG/ACT nasal spray Place 2 sprays into both nostrils daily as needed for allergies or rhinitis. 16 g 0   pantoprazole (PROTONIX) 40 MG tablet Take 1 tablet (40 mg total) by mouth daily. 90 tablet 1   potassium chloride SA (KLOR-CON M) 20 MEQ tablet Take 2 tablets (40 mEq total) by mouth daily. 60 tablet 6   rosuvastatin (CRESTOR) 40 MG tablet Take 1 tablet (40 mg total) by mouth daily. 30 tablet 11   torsemide (DEMADEX) 20 MG tablet Take 4 tablets (80 mg total) by mouth 2 (two) times daily. 240 tablet 6   valACYclovir (VALTREX) 1000 MG tablet Take 1 tablet (1,000 mg total) by mouth daily. 7 tablet 0   vitamin C (ASCORBIC ACID) 250 MG tablet Take 500 mg by mouth daily.     VITAMIN D PO Take 4,000 Units by mouth daily.     No facility-administered medications prior to visit.     Per HPI unless specifically indicated in ROS section below Review of Systems  Objective:  BP 138/64   Pulse 60   Temp (!) 97.4 F (36.3 C) (Temporal)  Ht 5' 6.5" (1.689 m)   Wt 134 lb (60.8 kg)   SpO2 96% Comment: 2 L  BMI 21.30 kg/m   Wt Readings from Last 3 Encounters:  09/11/22 134 lb (60.8 kg)  09/02/22 135 lb 2 oz (61.3 kg)  08/21/22 142 lb (64.4 kg)      Physical Exam Vitals and nursing note reviewed.  Constitutional:      Appearance: Normal appearance.     Comments:  Ambulates with cane Supplemental oxygen in place  Cardiovascular:     Rate and Rhythm: Normal rate and regular rhythm.     Pulses: Normal pulses.     Heart sounds: Normal heart sounds. No murmur heard. Pulmonary:     Effort: Pulmonary effort is normal. No respiratory distress.     Breath sounds: Normal breath sounds. No wheezing, rhonchi or rales.  Musculoskeletal:        General: Tenderness  (BLE) present.     Right lower leg: No edema.     Left lower leg: No edema.  Skin:    General: Skin is warm and dry.     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          Comments: Drying blistering rash to left lower back into buttock  Neurological:     Mental Status: She is alert.  Psychiatric:        Mood and Affect: Mood normal.        Behavior: Behavior normal.       Lab Results  Component Value Date   CREATININE 1.69 (H) 09/02/2022   BUN 44 (H) 09/02/2022   NA 138 09/02/2022   K 3.9 09/02/2022   CL 96 09/02/2022   CO2 32 09/02/2022   GFR 27  Assessment & Plan:   Problem List Items Addressed This Visit     Chronic kidney disease, stage 3b (HCC)    Latest GFR dropped to 27. See below re torsemide dosing.       Chronic diastolic CHF (congestive heart failure) (HCC)    Torsemide increased to 80mg  BID last month, with recent GFR dropping from 35 to 27.  Seems clinically euvolemic today.  Recommend drop torsemide back to 80mg  once daily in am for 3 days then return to BID dosing. Drop potassium accordingly  Defer to CHF clinic scheduled on Monday for further recommendations      Herpes zoster - Primary    Rash to right lower back into buttock/groin continues drying up.  She is having significant neuropathic pain associated with shingles, tylenol up to 4gm ineffective for pain control.  Recommend limit tylenol to 3gm/day.  Discussed neuropathic pain meds gabapentin and lyrica. Will Rx gabapentin 100mg  nightly with option for slow titration as discussed as tolerated. Reviewed side effects to watch for.  Avoid stronger pain medications due to significant side effects when previously tried (see allergy list).  Update with effect.        Meds ordered this encounter  Medications   gabapentin (NEURONTIN) 100 MG capsule    Sig: Take 1 capsule (100 mg total) by mouth at bedtime for 3 days, THEN 1 capsule (100 mg  total) 2 (two) times daily for 3 days, THEN 1 capsule (100 mg total) 3 (three) times daily for 3 days.    Dispense:  90 capsule    Refill:  1    No orders of the defined types were placed in this encounter.  Patient Instructions  Drop torsemide dose to 4 tablets once daily for next 3 days, drop potassium supplement accordingly. Then on weekend restart torsemide 80mg  twice daily.  Continue tylenol 1000mg  max 3 times a day Try gabapentin nerve pain medicine 100mg  nightly, if tolerating well may increase to twice daily after 3 days and again to three times daily if needed. Update me with effect.  Return in 3 months for physical/wellness visit  Follow up plan: Return in about 3 months (around 12/12/2022), or if symptoms worsen or fail to improve, for annual exam, prior fasting for blood work, medicare wellness visit.  [name], MD

## 2022-09-11 NOTE — Patient Instructions (Addendum)
Drop torsemide dose to 4 tablets once daily for next 3 days, drop potassium supplement accordingly. Then on weekend restart torsemide 80mg  twice daily.  Continue tylenol 1000mg  max 3 times a day Try gabapentin nerve pain medicine 100mg  nightly, if tolerating well may increase to twice daily after 3 days and again to three times daily if needed. Update me with effect.  Return in 3 months for physical/wellness visit

## 2022-09-11 NOTE — Assessment & Plan Note (Signed)
Latest GFR dropped to 27. See below re torsemide dosing.

## 2022-09-16 ENCOUNTER — Ambulatory Visit (HOSPITAL_COMMUNITY)
Admission: RE | Admit: 2022-09-16 | Discharge: 2022-09-16 | Disposition: A | Discharge status: Home / Self Care | Payer: PPO | Source: Ambulatory Visit | Attending: Internal Medicine | Admitting: Internal Medicine

## 2022-09-16 ENCOUNTER — Encounter (HOSPITAL_COMMUNITY): Payer: Self-pay

## 2022-09-16 VITALS — BP 120/68 | HR 58 | Wt 132.6 lb

## 2022-09-16 DIAGNOSIS — D3A8 Other benign neuroendocrine tumors: Secondary | ICD-10-CM

## 2022-09-16 DIAGNOSIS — N183 Chronic kidney disease, stage 3 unspecified: Secondary | ICD-10-CM

## 2022-09-16 DIAGNOSIS — I350 Nonrheumatic aortic (valve) stenosis: Secondary | ICD-10-CM | POA: Diagnosis not present

## 2022-09-16 DIAGNOSIS — K922 Gastrointestinal hemorrhage, unspecified: Secondary | ICD-10-CM | POA: Diagnosis not present

## 2022-09-16 DIAGNOSIS — I4821 Permanent atrial fibrillation: Secondary | ICD-10-CM | POA: Diagnosis not present

## 2022-09-16 DIAGNOSIS — I5032 Chronic diastolic (congestive) heart failure: Secondary | ICD-10-CM

## 2022-09-16 DIAGNOSIS — I10 Essential (primary) hypertension: Secondary | ICD-10-CM | POA: Diagnosis not present

## 2022-09-16 DIAGNOSIS — I272 Pulmonary hypertension, unspecified: Secondary | ICD-10-CM

## 2022-09-16 NOTE — Progress Notes (Signed)
Heart Failure TeleHealth Note Due to national recommendations of social distancing due to COVID 19, Audio/video telehealth visit is felt to be most appropriate for this patient at this time.  See MyChart message from today for patient consent regarding telehealth for [facility]   Date:  09/16/2022    ID:  [name], DOB December 26, 1933, MRN [id number]  Location: Home  Provider location: [place] [facility] Type of Visit: Established patient    PCP:  [name], MD    Cardiologist:  Sherryl Manges, MD Primary HF: Dr. Shirlee Latch   Chief Complaint:     Chief Complaint  Patient presents with   Video Visit   HF follow up   History of Present Illness: 89 y.o. with history of permanent atrial fibrillation, chronic diastolic CHF, CKD stage 3, smoking/prior COPD, renal cell CA s/p right nephrectomy.  She has a prior history of HFrEF with nonischemic cardiomyoapthy and had a Medtronic ICD placed.  However, subsequently her EF has recovered.  More recently, she has had symptomatic HFpEF.    She has had dyspnea and edema for "a long time," worse starting 1/20. In 6/20, she had left hip ORIF after mechanical fall.  Echo in 6/20 showed EF 60-65%, mild RV dilation with normal systolic function, PASP 93 mmHg.  After she got home from the hospital stay post-ORIF, she noted marked peripheral edema.  She was short of breath walking short distances.  +orthopnea.  She came back to the hospital on 11/04/18 and was admitted with volume overload.  She was diuresed aggressively and lost about 22 lbs.  RHC showed pulmonary venous hypertension.  PCWP tracing had prominent v-waves in absence of significant MR, suggesting significant diastolic dysfunction.   She has been noted to have significant Fe deficiency anemia, but FOBT was negative during 7/20 hospitalization.   She was admitted in 11/20 with symptomatic anemia, GI workup concerning for ischemic bowel.  Mesenteric dopplers showed  70-99% celiac and SMA stenoses, seen by Dr. Durwin Nora with VVS and conservative management recommended.  Capsule endoscopy showed no definite source of bleeding.  She was volume overloaded due to significant RV dysfunction and diuresed, she also developed AKI on CKD stage 3.      In 10/22, she had bilateral CIA stents placed. ABIs in 11/22 showed patent stents.  She had a skin cancer removed from her left leg. 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   Echo 2/23 EF 65-70% with mild LVH, D-shaped septum with moderate RV enlargement, and mildly decreased RV function, PASP 92, moderate-severe TR, dilated IVC, moderate aortic stenosis.  Echo in 10/23 showed EF 60-65%, D-shaped septum, mild RV enlargement with normal RV systolic function, PASP 90, moderate-severe TR, mild AS, IVC dilated.   Patient was found to have a neuroendocrine tumor of the pancreatic head in the fall of 2023.  She has been treated with sandostatin monthly.  She also had further GI bleeding from small bowel AVMs in fall 2023.  She also had COVID-19 in 10/23 and has been on home oxygen 2L since that time.   Follow up 2/24, NYHA II-III chronic symptoms, volume overloaded. Torsemide increased to 80 mg daily, Farxiga added.   Follow up with Dr. Graciela Husbands, long discussion about EOL. Given DNR form and high-voltage therapies inactivated on ICD.  Diagnosed with shingles 09/02/22. Treated with Valtrex x1 week.   Scheduled for virtual visit today with current shingles, has open sores on her  hip and legs. Overall feeling poor. Has numbness and singling down her L leg. Denies palpitations, CP, dizziness, or PND/Orthopnea. Some edema in her legs, has not been wearing compression socks as she has "open sores" down there. SOB with exertion. Appetite poor with current illness. No fever or chills. Weight at home 132 pounds. Taking all medications. No smoking or alcohol.   ECG (personally reviewed): none today.  Medtronic  device interrogation: unable to interrogate device today   Cardiomems today 17, goal 17   Labs (7/20): K 3.7, creatinine 1.44 => 1.33 Labs (12/20): pro-BNP 431, K 4.4, creatinine 1.24, hgb 8.2 Labs (10/21): K 4, creatinine 1.83 Labs (12/21): K 3.5, creatinine 1.45 Labs (2/22): K 5.8 (Lokelma ordered), creatinine 1.39 Labs (3/22): K 4.4  Creatinine 1.42 Labs (8/22): K 4.2, creatinine 1.46, hgb 10.7 Labs (11/22): K 4.5, creatinine 1.69, LFTs normal Labs (1/23): hgb 11, K 4.2, creatinine 1.63, BNP 252 Labs (2/23): K 3.5, creatinine 1.37 Labs (4/23): K 4.0, creatinine 1.38 Labs (1/24): K 4.9, creatinine 1.52, hgb 9.2, LFTs normal Labs (4/24): K 4.7, creatinine 1.47   PMH: 1. COPD 2. Renal cell carcinoma: S/p right kidney resection.  3. CKD stage 3 4. Fe deficiency anemia 5. Atrial fibrillation: Permanent. She has failed amiodarone, Tikosyn, and flecainide as well as multiple cardioversions. 6. Chronic diastolic CHF: She has history of prior HFrEF with nonischemic cardiomyopathy.  She has a Medtronic ICD.   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  - More recently, EF has been in normal range but she has had diastolic CHF.  - Echo (6/20): EF 60-65%, mildly dilated RV with PASP 93 mmHg, mild-moderate TR.  - RHC (7/20): mean RA 11, PA 64/18 mean 39, mean PCWP 24 with v waves to 41, CI 4.21, PVR 1.9 WU.  - Cardiomems placement - RHC (11/20): mean RA 13, PA 63/23 mean 25 with v waves to 47 (MR only mild by echo), mean PCWP 25, CI 3.18, PVR 2.6 WU - Echo (11/21): EF 65-70%, moderate LVH, D-shaped septum with mildly decreased RV systolic function, RVSP 85.9 mmHg, severe RAE, severe TR, mod-sev AS. - Echo (2/23): EF 65-70% with mild LVH, D-shaped septum with moderate RV enlargement, and mildly decreased RV function, PASP 92, moderate-severe TR, dilated IVC, moderate aortic stenosis.  - Echo (10/23): EF 60-65%, D-shaped septum, mild RV enlargement with normal RV systolic function, PASP 90,  moderate-severe TR, mild AS, IVC dilated.  7. Hyperlipidemia 8. Carotid stenosis: Carotid dopplers (3/19) with 40-59% LICA stenosis.   9. Possible ischemic bowel in 11/20 10. 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 Conservative management per Dr. Durwin Nora.  - 10/22 bilateral CIA stents.  - 11/22 ABIs: 1.07 left, 0.83 right with patent CIA stents.  11. 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  12. Squamous cell skin cancer.  13. Aortic stenosis: Mild on 10/23 echo.  14. 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  15. COVID 19 in 10/23         Past Medical History:  Diagnosis Date   Actinic keratosis 01/17/2015    R forearm   Adjustment disorder with anxiety     Anemia     Anginal pain (HCC)     Arthritis      "some in my hands" (11/11/2012)   Atrial fibrillation (HCC)     Atypical mole 03/25/2018    L forearm - severe   Automatic implantable cardioverter-defibrillator in situ  Avascular necrosis of hip (HCC) 05/03/2011   Blood in stool     Carotid artery stenosis 09/2007    a. 09/2007: 60-79% bilateral (stable); b. 10/2008: 40-59% R 60-79%    Cellulitis of left lower extremity 07/13/2020   Chest pain, unspecified     Chronic airway obstruction, not elsewhere classified     Chronic bronchitis (HCC)      "get it some; not q year" (11/11/2012)   Chronic diastolic CHF (congestive heart failure) (HCC) 06/04/2013   Chronic kidney disease, unspecified     Coronary artery disease      non-obstructive by 2006 cath   COVID-19 virus infection 02/12/2022   Displaced fracture of left femoral neck (HCC) 10/09/2018   Frequent UTI      "get them a couple times/yr" (11/11/2012)   GI bleed 03/28/2020   Hemorrhage of rectum and anus     High cholesterol     History of blood transfusion 04/2011    "after hip OR" (11/11/2012)   Hx of renal cell cancer     Hypertension 05/20/2011   Hypertr obst cardiomyop      Hypotension, unspecified      cardiac cath 2006..nonobstructive CAD 30-40s lesions.Marland KitchenETT 1/09 nondiagnostic due to poor HR response..Right Renal Cancer 2003   Long term (current) use of anticoagulants     Malignant neoplasm of kidney, except pelvis     Osteoarthritis of right hip     Other and unspecified coagulation defects     PONV (postoperative nausea and vomiting)     Presence of permanent cardiac pacemaker     RECTAL BLEEDING 10/13/2009    Qualifier: Diagnosis of  By: Wilmon Pali NP, Paula     Renal cancer (HCC) 06/2001    Right   Secondary cardiomyopathy, unspecified     Sinus bradycardia     Squamous cell carcinoma of skin 01/17/2015    R lat wrist   Squamous cell carcinoma of skin 01/29/2018    R post upper leg - superficially invasive   Squamous cell carcinoma of skin 03/25/2018    L lat foot   Squamous cell carcinoma of skin 11/09/2020    left lat foot - EDC 01/01/21, recurrent 01/31/21 - MOHs 02/22/21   Squamous cell carcinoma of skin 12/19/2021    Right Posterior Medial Thigh, EDC   Squamous cell carcinoma of skin 12/19/2021    SCC IS, L lat heel, EDC 02/04/2022   Squamous cell carcinoma of skin 01/21/2022    L forearm, EDC 02/04/2022   Squamous cell carcinoma of skin 01/21/2022    R lower leg below knee, EDC 02/04/2022   Squamous cell carcinoma of skin 01/21/2022    SCCIS, R post heel, EDC 02/04/2022   Squamous cell carcinoma of skin 07/01/2022    left lower abdomen, in situ, EDC   Squamous cell carcinoma of skin 07/01/2022    left medial chest, EDC   Urge incontinence           Past Surgical History:  Procedure Laterality Date   ABDOMINAL AORTOGRAM W/LOWER EXTREMITY N/A 02/12/2021    Procedure: ABDOMINAL AORTOGRAM W/LOWER EXTREMITY;  Surgeon: Maeola Harman, MD;  Location: [facility] INVASIVE CV LAB;  Service: Cardiovascular;  Laterality: N/A;   ABDOMINAL HYSTERECTOMY   1975    for benign causes   APPENDECTOMY       BI-VENTRICULAR PACEMAKER UPGRADE    05/04/2010   BIOPSY   02/28/2019    Procedure: BIOPSY;  Surgeon: Rachael Fee, MD;  Location: [facility] ENDOSCOPY;  Service: Endoscopy;;   BIOPSY   03/04/2022    Procedure: BIOPSY;  Surgeon: Lemar Lofty., MD;  Location: [facility];  Service: Gastroenterology;;   CARDIAC CATHETERIZATION   2006   CARDIOVERSION N/A 02/20/2018    Procedure: CARDIOVERSION;  Surgeon: Antonieta Iba, MD;  Location: [facility] ORS;  Service: Cardiovascular;  Laterality: N/A;   CARDIOVERSION N/A 03/27/2018    Procedure: CARDIOVERSION;  Surgeon: Antonieta Iba, MD;  Location: [facility] ORS;  Service: Cardiovascular;  Laterality: N/A;   CATARACT EXTRACTION W/ INTRAOCULAR LENS  IMPLANT, BILATERAL   01/2006-02-2006   CHOLECYSTECTOMY N/A 11/11/2012    Procedure: LAPAROSCOPIC CHOLECYSTECTOMY WITH INTRAOPERATIVE CHOLANGIOGRAM;  Surgeon: Wilmon Arms. Corliss Skains, MD;  Location: [facility] OR;  Service: General;  Laterality: N/A;   COLONOSCOPY WITH PROPOFOL N/A 02/28/2019    Procedure: COLONOSCOPY WITH PROPOFOL;  Surgeon: Rachael Fee, MD;  Location: [facility] ENDOSCOPY;  Service: Endoscopy;  Laterality: N/A;   ENTEROSCOPY N/A 03/30/2020    Procedure: ENTEROSCOPY;  Surgeon: Shellia Cleverly, DO;  Location: [facility] ENDOSCOPY;  Service: Gastroenterology;  Laterality: N/A;   ENTEROSCOPY N/A 02/10/2021    Procedure: ENTEROSCOPY;  Surgeon: Jeani Hawking, MD;  Location: [facility] ENDOSCOPY;  Service: Endoscopy;  Laterality: N/A;   ENTEROSCOPY N/A 02/15/2022    Procedure: ENTEROSCOPY;  Surgeon: Imogene Burn, MD;  Location: [facility] ENDOSCOPY;  Service: Gastroenterology;  Laterality: N/A;   EP IMPLANTABLE DEVICE N/A 02/21/2016    Procedure: ICD Generator Changeout;  Surgeon: Duke Salvia, MD;  Location: [facility] INVASIVE CV LAB;  Service: Cardiovascular;  Laterality: N/A;   ESOPHAGOGASTRODUODENOSCOPY N/A 03/04/2022    Procedure: ESOPHAGOGASTRODUODENOSCOPY (EGD);  Surgeon: Lemar Lofty., MD;  Location: [facility] ENDOSCOPY;  Service: Gastroenterology;  Laterality: N/A;    ESOPHAGOGASTRODUODENOSCOPY (EGD) WITH PROPOFOL N/A 02/28/2019    Procedure: ESOPHAGOGASTRODUODENOSCOPY (EGD) WITH PROPOFOL;  Surgeon: Rachael Fee, MD;  Location: [facility] ENDOSCOPY;  Service: Endoscopy;  Laterality: N/A;   EUS N/A 03/04/2022    Procedure: UPPER ENDOSCOPIC ULTRASOUND (EUS) RADIAL;  Surgeon: Lemar Lofty., MD;  Location: [facility] ENDOSCOPY;  Service: Gastroenterology;  Laterality: N/A;   FINE NEEDLE ASPIRATION N/A 03/04/2022    Procedure: FINE NEEDLE ASPIRATION (FNA) LINEAR;  Surgeon: Lemar Lofty., MD;  Location: [facility] ENDOSCOPY;  Service: Gastroenterology;  Laterality: N/A;   GIVENS CAPSULE STUDY N/A 03/15/2019    Procedure: GIVENS CAPSULE STUDY;  Surgeon: Tressia Danas, MD;  Location: [facility] ENDOSCOPY;  Service: Gastroenterology;  Laterality: N/A;   HOT HEMOSTASIS N/A 03/30/2020    Procedure: HOT HEMOSTASIS (ARGON PLASMA COAGULATION/BICAP);  Surgeon: Shellia Cleverly, DO;  Location: [facility] ENDOSCOPY;  Service: Gastroenterology;  Laterality: N/A;   HOT HEMOSTASIS N/A 02/10/2021    Procedure: HOT HEMOSTASIS (ARGON PLASMA COAGULATION/BICAP);  Surgeon: Jeani Hawking, MD;  Location: [facility] ENDOSCOPY;  Service: Endoscopy;  Laterality: N/A;   HOT HEMOSTASIS N/A 02/15/2022    Procedure: HOT HEMOSTASIS (ARGON PLASMA COAGULATION/BICAP);  Surgeon: Imogene Burn, MD;  Location: [facility] ENDOSCOPY;  Service: Gastroenterology;  Laterality: N/A;   INSERT / REPLACE / REMOVE PACEMAKER   05-01-11    02-28-05-/05-04-10-ICD-MEDTRONIC MAXIMAL DR   IR ANGIOGRAM SELECTIVE EACH ADDITIONAL VESSEL   02/18/2022   IR ANGIOGRAM SELECTIVE EACH ADDITIONAL VESSEL   02/18/2022   IR ANGIOGRAM VISCERAL SELECTIVE   02/16/2022   IR ANGIOGRAM VISCERAL SELECTIVE   02/16/2022   IR EMBO ART  VEN HEMORR LYMPH EXTRAV  INC GUIDE ROADMAPPING   02/16/2022   IR THORACENTESIS ASP PLEURAL SPACE W/IMG GUIDE   02/21/2022  IR THORACENTESIS ASP PLEURAL SPACE W/IMG GUIDE   02/22/2022   IR US GUIDE VASC ACCESS RIGHT   02/16/2022    JOINT REPLACEMENT       LAPAROSCOPIC CHOLECYSTECTOMY   11/11/2012   LAPAROSCOPIC LYSIS OF ADHESIONS N/A 11/11/2012    Procedure: LAPAROSCOPIC LYSIS OF ADHESIONS;  Surgeon: Wilmon Arms. Corliss Skains, MD;  Location: [facility] OR;  Service: General;  Laterality: N/A;   NEPHRECTOMY Right 06/2001     S/P RENAL CELL CANCER   PERIPHERAL VASCULAR INTERVENTION Bilateral 02/12/2021    Procedure: PERIPHERAL VASCULAR INTERVENTION;  Surgeon: Maeola Harman, MD;  Location: [facility] INVASIVE CV LAB;  Service: Cardiovascular;  Laterality: Bilateral;  Iliac artery stents   PRESSURE SENSOR/CARDIOMEMS N/A 02/03/2019    Procedure: PRESSURE SENSOR/CARDIOMEMS;  Surgeon: Laurey Morale, MD;  Location: [facility] INVASIVE CV LAB;  Service: Cardiovascular;  Laterality: N/A;   RIGHT HEART CATH N/A 11/09/2018    Procedure: RIGHT HEART CATH;  Surgeon: Laurey Morale, MD;  Location: [facility] INVASIVE CV LAB;  Service: Cardiovascular;  Laterality: N/A;   RIGHT HEART CATH N/A 03/08/2019    Procedure: RIGHT HEART CATH;  Surgeon: Laurey Morale, MD;  Location: [facility] INVASIVE CV LAB;  Service: Cardiovascular;  Laterality: N/A;   SUBMUCOSAL TATTOO INJECTION   02/28/2019    Procedure: SUBMUCOSAL TATTOO INJECTION;  Surgeon: Rachael Fee, MD;  Location: [facility] ENDOSCOPY;  Service: Endoscopy;;   TOTAL HIP ARTHROPLASTY Right 05/03/2011    Procedure: TOTAL HIP ARTHROPLASTY ANTERIOR APPROACH;  Surgeon: Kathryne Hitch;  Location: [facility] ORS;  Service: Orthopedics;  Laterality: Right;  Removal of Cannulated Screws Right Hip, Right Direct Anterior Hip Replacement   TOTAL HIP ARTHROPLASTY Left 10/09/2018    Procedure: TOTAL HIP ARTHROPLASTY ANTERIOR APPROACH;  Surgeon: Samson Frederic, MD;  Location: [facility] OR;  Service: Orthopedics;  Laterality: Left;              Current Outpatient Medications  Medication Sig Dispense Refill   acetaminophen (TYLENOL) 500 MG tablet Take 500-1,000 mg by mouth every 8 (eight) hours as needed for mild pain or headache.       Biotin  1000 MCG CHEW Chew 2 1e11 Vector Genomes by mouth daily.       Cyanocobalamin (B-12 PO) Take 1 tablet by mouth daily.       dapagliflozin propanediol (FARXIGA) 10 MG TABS tablet Take 1 tablet (10 mg total) by mouth daily before breakfast. 30 tablet 11   ELIQUIS 2.5 MG TABS tablet TAKE ONE TABLET BY MOUTH TWICE A DAY 60 tablet 11   fluticasone (FLONASE) 50 MCG/ACT nasal spray Place 2 sprays into both nostrils daily as needed for allergies or rhinitis. 16 g 0   pantoprazole (PROTONIX) 40 MG tablet Take 1 tablet (40 mg total) by mouth daily. 90 tablet 1   potassium chloride SA (KLOR-CON M) 20 MEQ tablet Take 2 tablets (40 mEq total) by mouth daily. 60 tablet 6   rosuvastatin (CRESTOR) 40 MG tablet Take 1 tablet (40 mg total) by mouth daily. 30 tablet 11   torsemide (DEMADEX) 20 MG tablet Take 4 tablets (80 mg total) by mouth 2 (two) times daily. 240 tablet 6   vitamin C (ASCORBIC ACID) 250 MG tablet Take 500 mg by mouth daily.       VITAMIN D PO Take 4,000 Units by mouth daily.       gabapentin (NEURONTIN) 100 MG capsule Take 1 capsule (100 mg total) by mouth at bedtime for 3 days, THEN 1 capsule (  100 mg total) 2 (two) times daily for 3 days, THEN 1 capsule (100 mg total) 3 (three) times daily for 3 days. (Patient not taking: Reported on 09/16/2022) 90 capsule 1    No current facility-administered medications for this encounter.      Allergies:   Dilaudid [hydromorphone], Fentanyl, Codeine, Morphine and related, Amoxicillin, Hydrocodone, Oxycodone, Tramadol, Whole blood, and Sulfonamide derivatives    Social History:  The patient  reports that she quit smoking about 21 years ago. Her smoking use included cigarettes. She has a 20.00 pack-year smoking history. She has never used smokeless tobacco. She reports that she does not drink alcohol and does not use drugs.    Family History:  The patient's family history includes Breast cancer in her cousin and another family member; Breast cancer (age of  onset: 37) in her maternal aunt; Early death in her father; Heart failure in her mother.    ROS:  Please see the history of present illness.   All other systems are personally reviewed and negative.    Exam:  (Video/Tele Health Call; Exam is subjective and or/visual.) General:  Speaks in full sentences. No resp difficulty. Lungs: Normal respiratory effort with conversation.  Abdomen: Non-distended per patient report Extremities: Pt with some BLE edema. Pain down L leg 2/2 shingles Neuro: Alert & oriented x 3.    Recent Labs: 03/01/2022: Magnesium 1.7 06/10/2022: B Natriuretic Peptide 208.4 07/24/2022: TSH 2.93 08/08/2022: ALT 9; Hemoglobin 10.0; Platelet Count 175 09/02/2022: BUN 44; Creatinine, Ser 1.69; Potassium 3.9; Sodium 138  Personally reviewed       Wt Readings from Last 3 Encounters:  09/16/22 60.1 kg (132 lb 9.6 oz)  09/11/22 60.8 kg (134 lb)  09/02/22 61.3 kg (135 lb 2 oz)    BP 120/68   Pulse (!) 58   Wt 60.1 kg (132 lb 9.6 oz)   SpO2 100%   BMI 21.08 kg/m      ASSESSMENT AND PLAN:  Wt Readings from Last 3 Encounters:  09/16/22 60.1 kg (132 lb 9.6 oz)  09/11/22 60.8 kg (134 lb)  09/02/22 61.3 kg (135 lb 2 oz)    Assessment/Plan: 1. Chronic diastolic CHF: With prominent RV failure.  Echo in 6/20 showed EF 60-65%, mild RV dilation with normal systolic function, PASP 93 mmHg.  She had remote nonischemic cardiomyopathy with recovery of EF, has Medtronic ICD.  I suspect that there is significant RV dysfunction. Atrial fibrillation likely contributes, but this appears to be permanent now as she has failed multiple anti-arrhythmics and is reasonably rate-controlled. RHC 11/20 showed elevated filling pressures and pulmonary venous hypertension.  Prominent v-waves on PCWP tracing likely due to stiff ventricle/diastolic dysfunction as she had only mild MR on echo.  Echo 10/23 with EF 60-65%, D-shaped septum, mild RV enlargement with normal RV systolic function, PASP 90,  moderate-severe TR, mild AS, IVC dilated. She is RV pacing at a high percentage, but LV EF has remained normal. NYHA Class IIIb symptoms. Has been recently less mobile with shingles.  - Continue torsemide 80 mg bid, Continue KCL 40 daily.  - Cardiomems today at goal. 77  - Stop SGLT2i with recent UTI and shingles around her vaginal area. - will schedule her to get labs (BMET/BNP) in 7-10 days at [facility] as this is closer to her 2. Hypertension: BP controlled.  3. Pulmonary hypertension: PA systolic pressure estimated 90 mmHg on last echo. Based on RHC 11/20, she has primarily pulmonary venous hypertension, not candidate for pulmonary vasodilators.  4. CKD: Stage 3.  Stop SGLT2i with recent UTI and shingles location - Most recent SCr 1.69 5. GI bleeding: Due to small bowel AVMs.  - She is being followed by Heme/Onc for her anemia.  6. Atrial fibrillation: Permanent now.  Rate control is reasonable. She has failed amiodarone, Tikosyn, and flecainide as well as multiple cardioversions.  - Continue Eliquis 2.5 mg bid (age, creatinine).  7. Aortic stenosis: Mild on last echo.  8. Neuroendocrine tumor of pancreatic head: Treating palliative with sandostatin.  9. GOC: I worry she is nearing end-stage RV failure. Dr. Graciela Husbands discussed EOL issues with her and her ICD high velocity therapies are de-activated. She has DNR. Has previously discussed palliative referral, wants to think about it.     Brynda Peon, AGACNP-BC  08/21/2022   COVID screen The patient does not have any symptoms that suggest any further testing/ screening at this time.  Social distancing reinforced today.   Patient Risk: After full review of this patients clinical status, I feel that they are at moderate risk for cardiac decompensation at this time.   Relevant cardiac medications were reviewed at length with the patient today. The patient does not have concerns regarding their medications at this time.    The following changes were  made today:   Recommended follow-up:  Follow up in 4-6 weeks with Dr. Shirlee Latch.   Today, I have spent  15 minutes with the patient with telehealth technology discussing the above issues .     Criselda Peaches, AGACNP-BC  09/16/2022 1:48 PM   [facility] [address] [facility] V[facility] [place] [address] [phone] (office) [phone] (fax)

## 2022-09-16 NOTE — Progress Notes (Signed)
  Patient Consent for Virtual Visit        [name] has provided verbal consent on 09/16/2022 for a virtual visit (video or telephone).   CONSENT FOR VIRTUAL VISIT FOR:  [name]  By participating in this virtual visit I agree to the following:  I hereby voluntarily request, consent and authorize [facility] and its employed or contracted physicians, physician assistants, nurse practitioners or other licensed health care professionals (the Practitioner), to provide me with telemedicine health care services (the "Services") as deemed necessary by the treating Practitioner. I acknowledge and consent to receive the Services by the Practitioner via telemedicine. I understand that the telemedicine visit will involve communicating with the Practitioner through live audiovisual communication technology and the disclosure of certain medical information by electronic transmission. I acknowledge that I have been given the opportunity to request an in-person assessment or other available alternative prior to the telemedicine visit and am voluntarily participating in the telemedicine visit.  I understand that I have the right to withhold or withdraw my consent to the use of telemedicine in the course of my care at any time, without affecting my right to future care or treatment, and that the Practitioner or I may terminate the telemedicine visit at any time. I understand that I have the right to inspect all information obtained and/or recorded in the course of the telemedicine visit and may receive copies of available information for a reasonable fee.  I understand that some of the potential risks of receiving the Services via telemedicine include:  Delay or interruption in medical evaluation due to technological equipment failure or disruption; Information transmitted may not be sufficient (e.g. poor resolution of images) to allow for appropriate medical decision making by the  Practitioner; and/or  In rare instances, security protocols could fail, causing a breach of personal health information.  Furthermore, I acknowledge that it is my responsibility to provide information about my medical history, conditions and care that is complete and accurate to the best of my ability. I acknowledge that Practitioner's advice, recommendations, and/or decision may be based on factors not within their control, such as incomplete or inaccurate data provided by me or distortions of diagnostic images or specimens that may result from electronic transmissions. I understand that the practice of medicine is not an exact science and that Practitioner makes no warranties or guarantees regarding treatment outcomes. I acknowledge that a copy of this consent can be made available to me via my patient portal [facility] MyChart), or I can request a printed copy by calling the office of [facility].    I understand that my insurance will be billed for this visit.   I have read or had this consent read to me. I understand the contents of this consent, which adequately explains the benefits and risks of the Services being provided via telemedicine.  I have been provided ample opportunity to ask questions regarding this consent and the Services and have had my questions answered to my satisfaction. I give my informed consent for the services to be provided through the use of telemedicine in my medical care

## 2022-09-16 NOTE — Patient Instructions (Addendum)
Thank you for coming in today  If you had labs drawn today, any labs that are abnormal the clinic will call you No news is good news  Medications: No changes   Follow up appointments: Your physician recommends that you schedule a follow-up appointment in: 7-10 days for BMET BNP Please go to nearest lab for [place] to get this drawn  Your physician recommends that you schedule a follow-up appointment in:  4-6 weeks at [facility].. you will be called and scheduled for this appointment   Do the following things EVERYDAY: Weigh yourself in the morning before breakfast. Write it down and keep it in a log. Take your medicines as prescribed Eat low salt foods--Limit salt (sodium) to 2000 mg per day.  Stay as active as you can everyday Limit all fluids for the day to less than 2 liters   At the [facility], you and your health needs are our priority. As part of our continuing mission to provide you with exceptional heart care, we have created designated Provider Care Teams. These Care Teams include your primary Cardiologist (physician) and Advanced Practice Providers (APPs- Physician Assistants and Nurse Practitioners) who all work together to provide you with the care you need, when you need it.   You may see any of the following providers on your designated Care Team at your next follow up: Dr Arvilla Meres Dr Marca Ancona Dr. Marcos Eke, NP Robbie Lis, [place] [facility] [place], [place] Brynda Peon, NP Karle Plumber, PharmD   Please be sure to bring in all your medications bottles to every appointment.    Thank you for choosing [facility]  If you have any questions or concerns before your next appointment please send [place] a message through [place] or call our office at [phone].    TO LEAVE A MESSAGE FOR THE NURSE SELECT OPTION 2, PLEASE LEAVE A MESSAGE INCLUDING: YOUR NAME DATE OF  BIRTH CALL BACK NUMBER REASON FOR CALL**this is important as we prioritize the call backs  YOU WILL RECEIVE A CALL BACK THE SAME DAY AS LONG AS YOU CALL BEFORE 4:00 PM

## 2022-09-17 DIAGNOSIS — I5033 Acute on chronic diastolic (congestive) heart failure: Secondary | ICD-10-CM | POA: Diagnosis not present

## 2022-09-17 DIAGNOSIS — Z4789 Encounter for other orthopedic aftercare: Secondary | ICD-10-CM | POA: Diagnosis not present

## 2022-09-17 DIAGNOSIS — M6281 Muscle weakness (generalized): Secondary | ICD-10-CM | POA: Diagnosis not present

## 2022-09-17 DIAGNOSIS — J90 Pleural effusion, not elsewhere classified: Secondary | ICD-10-CM | POA: Diagnosis not present

## 2022-09-17 DIAGNOSIS — R2689 Other abnormalities of gait and mobility: Secondary | ICD-10-CM | POA: Diagnosis not present

## 2022-09-17 DIAGNOSIS — J449 Chronic obstructive pulmonary disease, unspecified: Secondary | ICD-10-CM | POA: Diagnosis not present

## 2022-09-19 ENCOUNTER — Telehealth (HOSPITAL_COMMUNITY): Payer: Self-pay | Admitting: Cardiology

## 2022-09-19 NOTE — Telephone Encounter (Signed)
Patient called to request medication for nausea  Above reviewed with Brynda Peon, NP Pt will need to contact PCP to discuss   Pt aware

## 2022-09-20 ENCOUNTER — Telehealth: Payer: Self-pay | Admitting: Family Medicine

## 2022-09-20 MED ORDER — ONDANSETRON HCL 4 MG PO TABS: 4.0000 mg | ORAL_TABLET | Freq: Three times a day (TID) | ORAL | 0 refills | Status: DC | PRN

## 2022-09-20 NOTE — Telephone Encounter (Signed)
Patient called in and stated that she has shingles. She stated that she is experiencing some nausea. She was wanting to know if Dr. Reece Agar could call her something in for that. Please advise. Thank you!

## 2022-09-20 NOTE — Telephone Encounter (Addendum)
Attempted to contact pt. No answer. No vm. Need to relay Dr. Timoteo Expose message and get answer to his questions.

## 2022-09-20 NOTE — Telephone Encounter (Signed)
Called patient reviewed all information and repeated back to me. Will call if any questions.  She will pick up the zofran.  She has not taken any Gabapentin. Patient will take tonight now that her grandson is at home.

## 2022-09-20 NOTE — Telephone Encounter (Signed)
I've sent in zofran for nausea.  How is nerve pain from shingles doing with gabapentin - how much gabapentin is she currently on?

## 2022-09-24 ENCOUNTER — Ambulatory Visit: Payer: PPO | Admitting: Dermatology

## 2022-10-02 ENCOUNTER — Ambulatory Visit: Payer: PPO | Admitting: Dermatology

## 2022-10-02 ENCOUNTER — Inpatient Hospital Stay: Payer: PPO

## 2022-10-02 ENCOUNTER — Inpatient Hospital Stay: Payer: PPO | Admitting: Hematology

## 2022-10-04 DIAGNOSIS — Z4789 Encounter for other orthopedic aftercare: Secondary | ICD-10-CM | POA: Diagnosis not present

## 2022-10-04 DIAGNOSIS — R2689 Other abnormalities of gait and mobility: Secondary | ICD-10-CM | POA: Diagnosis not present

## 2022-10-04 DIAGNOSIS — I5033 Acute on chronic diastolic (congestive) heart failure: Secondary | ICD-10-CM | POA: Diagnosis not present

## 2022-10-04 DIAGNOSIS — J449 Chronic obstructive pulmonary disease, unspecified: Secondary | ICD-10-CM | POA: Diagnosis not present

## 2022-10-04 DIAGNOSIS — M6281 Muscle weakness (generalized): Secondary | ICD-10-CM | POA: Diagnosis not present

## 2022-10-04 DIAGNOSIS — K8689 Other specified diseases of pancreas: Secondary | ICD-10-CM | POA: Diagnosis not present

## 2022-10-04 DIAGNOSIS — J90 Pleural effusion, not elsewhere classified: Secondary | ICD-10-CM | POA: Diagnosis not present

## 2022-10-09 NOTE — Progress Notes (Signed)
Remote ICD transmission.   

## 2022-10-14 ENCOUNTER — Inpatient Hospital Stay: Payer: PPO | Admitting: Hematology

## 2022-10-14 ENCOUNTER — Inpatient Hospital Stay: Payer: PPO

## 2022-10-14 ENCOUNTER — Telehealth: Payer: Self-pay | Admitting: Hematology

## 2022-10-14 NOTE — Telephone Encounter (Signed)
Patient called to reschedule appointment due to not feeling well. Patient is aware of new appointment times/dates.

## 2022-10-14 NOTE — Assessment & Plan Note (Deleted)
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   -Outpatient EUS 03/04/2022 by Dr. 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 Freida Busman did not recommend surgery in multifocal NET and also due to her age and co-morbidities. -She began first line sandostatin injections q28 days on 03/27/22.  -Dotatate PET scan on 04/10/2022, which was done after first injection 12/6, shows no significant tracer avidity in the pancrease or distant sites. Findings are likely due to close timing relative to the sandostatin injection blocking receptors. When we repeat this for restaging, will arrange 4 weeks after sandostatin -[name] appears stable. S/p 4 doses Sandostatin tolerating well, cramping, diarrhea, and flushing have improved.

## 2022-10-18 ENCOUNTER — Telehealth: Payer: Self-pay | Admitting: Family Medicine

## 2022-10-18 DIAGNOSIS — R2689 Other abnormalities of gait and mobility: Secondary | ICD-10-CM | POA: Diagnosis not present

## 2022-10-18 DIAGNOSIS — J90 Pleural effusion, not elsewhere classified: Secondary | ICD-10-CM | POA: Diagnosis not present

## 2022-10-18 DIAGNOSIS — I5033 Acute on chronic diastolic (congestive) heart failure: Secondary | ICD-10-CM | POA: Diagnosis not present

## 2022-10-18 DIAGNOSIS — M6281 Muscle weakness (generalized): Secondary | ICD-10-CM | POA: Diagnosis not present

## 2022-10-18 DIAGNOSIS — Z4789 Encounter for other orthopedic aftercare: Secondary | ICD-10-CM | POA: Diagnosis not present

## 2022-10-18 DIAGNOSIS — J449 Chronic obstructive pulmonary disease, unspecified: Secondary | ICD-10-CM | POA: Diagnosis not present

## 2022-10-18 NOTE — Telephone Encounter (Signed)
Received request for supplemental oxygen renewal - pt will need to come in for nurse visit for ambulatory pulse ox.  She continues using continuous oxygen with portable oxygen concentrator.

## 2022-10-18 NOTE — Telephone Encounter (Signed)
Spoke with pt relaying Dr. Timoteo Expose message and scheduling NV on 10/23/22 at 2:00 for ambulatory pulse ox.

## 2022-10-23 ENCOUNTER — Ambulatory Visit: Payer: PPO

## 2022-10-30 ENCOUNTER — Ambulatory Visit: Payer: PPO | Admitting: Dermatology

## 2022-10-31 ENCOUNTER — Ambulatory Visit: Payer: PPO | Admitting: Family Medicine

## 2022-10-31 ENCOUNTER — Ambulatory Visit: Payer: PPO

## 2022-10-31 ENCOUNTER — Encounter: Payer: Self-pay | Admitting: Family Medicine

## 2022-10-31 ENCOUNTER — Ambulatory Visit (INDEPENDENT_AMBULATORY_CARE_PROVIDER_SITE_OTHER): Payer: PPO | Admitting: Family Medicine

## 2022-10-31 ENCOUNTER — Telehealth: Payer: Self-pay

## 2022-10-31 VITALS — BP 136/66 | HR 66 | Temp 98.0°F | Ht 66.5 in | Wt 138.0 lb

## 2022-10-31 DIAGNOSIS — Z8719 Personal history of other diseases of the digestive system: Secondary | ICD-10-CM

## 2022-10-31 DIAGNOSIS — R5383 Other fatigue: Secondary | ICD-10-CM | POA: Diagnosis not present

## 2022-10-31 LAB — COMPREHENSIVE METABOLIC PANEL
ALT: 7 U/L (ref 0–35)
AST: 15 U/L (ref 0–37)
Albumin: 4.2 g/dL (ref 3.5–5.2)
Alkaline Phosphatase: 51 U/L (ref 39–117)
BUN: 42 mg/dL — ABNORMAL HIGH (ref 6–23)
CO2: 31 mEq/L (ref 19–32)
Calcium: 9.6 mg/dL (ref 8.4–10.5)
Chloride: 100 mEq/L (ref 96–112)
Creatinine, Ser: 1.57 mg/dL — ABNORMAL HIGH (ref 0.40–1.20)
GFR: 29.66 mL/min — ABNORMAL LOW (ref >60.00)
Glucose, Bld: 87 mg/dL (ref 70–99)
Potassium: 5 mEq/L (ref 3.5–5.1)
Sodium: 139 mEq/L (ref 135–145)
Total Bilirubin: 0.5 mg/dL (ref 0.2–1.2)
Total Protein: 6.7 g/dL (ref 6.0–8.3)

## 2022-10-31 LAB — CBC WITH DIFFERENTIAL/PLATELET
Basophils Absolute: 0 10*3/uL (ref 0.0–0.1)
Basophils Relative: 0.4 % (ref 0.0–3.0)
Eosinophils Absolute: 0 10*3/uL (ref 0.0–0.7)
Eosinophils Relative: 0 % (ref 0.0–5.0)
HCT: 27.7 % — ABNORMAL LOW (ref 36.0–46.0)
Hemoglobin: 8.8 g/dL — ABNORMAL LOW (ref 12.0–15.0)
Lymphocytes Relative: 9.5 % — ABNORMAL LOW (ref 12.0–46.0)
Lymphs Abs: 0.4 10*3/uL — ABNORMAL LOW (ref 0.7–4.0)
MCHC: 31.6 g/dL (ref 30.0–36.0)
MCV: 84.3 fl (ref 78.0–100.0)
Monocytes Absolute: 0.5 10*3/uL (ref 0.1–1.0)
Monocytes Relative: 12.3 % — ABNORMAL HIGH (ref 3.0–12.0)
Neutro Abs: 3.2 10*3/uL (ref 1.4–7.7)
Neutrophils Relative %: 77.8 % — ABNORMAL HIGH (ref 43.0–77.0)
Platelets: 203 10*3/uL (ref 150.0–400.0)
RBC: 3.29 Mil/uL — ABNORMAL LOW (ref 3.87–5.11)
RDW: 17.9 % — ABNORMAL HIGH (ref 11.5–15.5)
WBC: 4.1 10*3/uL (ref 4.0–10.5)

## 2022-10-31 LAB — IBC + FERRITIN
Ferritin: 32.1 ng/mL (ref 10.0–291.0)
Iron: 47 ug/dL (ref 42–145)
Saturation Ratios: 16.1 % — ABNORMAL LOW (ref 20.0–50.0)
TIBC: 291.2 ug/dL (ref 250.0–450.0)
Transferrin: 208 mg/dL — ABNORMAL LOW (ref 212.0–360.0)

## 2022-10-31 LAB — TSH: TSH: 2.13 u[IU]/mL (ref 0.35–5.50)

## 2022-10-31 LAB — VITAMIN D 25 HYDROXY (VIT D DEFICIENCY, FRACTURES): VITD: 66.68 ng/mL (ref 30.00–100.00)

## 2022-10-31 NOTE — Telephone Encounter (Signed)
error 

## 2022-10-31 NOTE — Telephone Encounter (Signed)
Noted  

## 2022-10-31 NOTE — Telephone Encounter (Signed)
Pt said for about 1 week she is more tired and weaker than usual. Pt said after being up for short period has to sit down to rest. pt wonders if hgb could be off again. Pt said no more SOB than usual.No CP or h/a or dizziness. Pt said she has lesion on foot and seeing specialist about that and wants to cancel ambulation pulse ox NV today and pt will cb to reschedule  that. UC & ED precautions given and pt voiced understanding., pt scheduled appt to see Dr Ermalene Searing 10/31/22 at 11:20.sending note to Dr Ermalene Searing and spoke with Misty Stanley CMA who is working with Dr Ermalene Searing today.

## 2022-10-31 NOTE — Assessment & Plan Note (Signed)
Worsening fatigue in patient with history of GI bleed.  No clear obvious GI bleed symptoms.  She also has multiple other health issues that could be contributing to fatigue including cardiomyopathy and COPD.  She denies any symptoms of infection.  She denies chest pain or significant change in shortness of breath of note her oxygen saturations drops with ambulation despite 3 L of oxygen.  We will evaluate with labs to rule out secondary causes of worsening fatigue.  ER and return precautions provided.

## 2022-10-31 NOTE — Progress Notes (Signed)
Patient ID: [name], female    DOB: 10-Apr-1934, 89 y.o.   MRN: [id number]  This visit was conducted in person.  BP 136/66   Pulse 66   Temp 98 F (36.7 C) (Temporal)   Ht 5' 6.5" (1.689 m)   Wt 138 lb (62.6 kg)   SpO2 96% Comment: 3 L  BMI 21.94 kg/m    CC:  Chief Complaint  Patient presents with   Fatigue    C/o of feeling more tired and weaker for the past week. Did ambulatory pulse ox on 3 L O2: SpO2 at start- 99%, SpO2 while ambulating- 82%, SpO2 at recovery- 97%.     Subjective:   HPI: [name] is a 89 y.o. female patient of Dr. Reece Agar with history of  Neuroendocrine tumor of pancreas,  nonischemic cardiomyopathy, permanent atrial fibrillation, COPD on oxygen, chronic kidney disease, hypertension, status post implantable cardioverter defibrillator, chronic diastolic congestive heart failure, mitral valve regurg, B12 deficiency, history of anemia and GI bleed, pulmonary hypertension past TIA and chronic fatigue presenting on 10/31/2022 for Fatigue (C/o of feeling more tired and weaker for the past week. Did ambulatory pulse ox on 3 L O2: SpO2 at start- 99%, SpO2 while ambulating- 82%, SpO2 at recovery- 97%. )  This patient is unknown to me with extensive complicated past medical history presents with worsening of her chronic fatigue symptoms. She states she has been feeling more tired in the last 1.5 week. No specific symptoms.. she just wants to make sure she is not anemic again.  No associated CP and  no worsened SOB.  No blood in stool but stool looks darker lately.. not coffee grounds.  Slightly more swollen in legs than usual.  No urinary symptoms, no N/V/D.   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  When walking around the office her oxygen started elevated but dropped to 82.  She recovered with an O2  at 97% at rest.  This was on 3 L of oxygen.  Relevant past medical, surgical, family and social history reviewed and updated as indicated. Interim medical history since our last visit reviewed. Allergies and medications reviewed and updated. Outpatient Medications Prior to Visit  Medication Sig Dispense Refill   acetaminophen (TYLENOL) 500 MG tablet Take 500-1,000 mg by mouth every 8 (eight) hours as needed for mild pain or headache.     Biotin 1000 MCG CHEW Chew 2 1e11 Vector Genomes by mouth daily.     Cyanocobalamin (B-12 PO) Take 1 tablet by mouth daily.     dapagliflozin propanediol (FARXIGA) 10 MG TABS tablet Take 1 tablet (10 mg total) by mouth daily before breakfast. 30 tablet 11   ELIQUIS 2.5 MG TABS tablet TAKE ONE TABLET BY MOUTH TWICE A DAY 60 tablet 11   fluticasone (FLONASE) 50 MCG/ACT nasal spray Place 2 sprays into both nostrils daily as needed for allergies or rhinitis. 16 g 0   ondansetron (ZOFRAN) 4 MG tablet Take 1 tablet (4 mg total) by mouth every 8 (eight) hours as needed for nausea or vomiting. 20 tablet 0   pantoprazole (PROTONIX) 40 MG tablet Take 1 tablet (40 mg total) by mouth daily. 90 tablet 1   potassium chloride SA (KLOR-CON M) 20 MEQ tablet Take 2 tablets (40 mEq total) by mouth daily. 60 tablet  6   rosuvastatin (CRESTOR) 40 MG tablet Take 1 tablet (40 mg total) by mouth daily. 30 tablet 11   torsemide (DEMADEX) 20 MG tablet Take 4 tablets (80 mg total) by mouth 2 (two) times daily. 240 tablet 6   vitamin C (ASCORBIC ACID) 250 MG tablet Take 500 mg by mouth daily.     VITAMIN D PO Take 4,000 Units by mouth daily.     gabapentin (NEURONTIN) 100 MG capsule Take 1 capsule (100 mg total) by mouth at bedtime for 3 days, THEN 1 capsule (100 mg total) 2 (two) times daily for 3 days, THEN 1 capsule (100 mg total) 3 (three) times daily for 3 days. (Patient not taking: Reported on 09/16/2022) 90 capsule 1   No facility-administered medications prior to visit.     Per HPI  unless specifically indicated in ROS section below Review of Systems  Constitutional:  Positive for fatigue. Negative for fever.  HENT:  Negative for congestion.   Eyes:  Negative for pain.  Respiratory:  Negative for cough and shortness of breath.   Cardiovascular:  Negative for chest pain, palpitations and leg swelling.  Gastrointestinal:  Negative for abdominal pain.  Genitourinary:  Negative for dysuria and vaginal bleeding.  Musculoskeletal:  Negative for back pain.  Neurological:  Negative for syncope, light-headedness and headaches.  Psychiatric/Behavioral:  Negative for dysphoric mood.    Objective:  BP 136/66   Pulse 66   Temp 98 F (36.7 C) (Temporal)   Ht 5' 6.5" (1.689 m)   Wt 138 lb (62.6 kg)   SpO2 96% Comment: 3 L  BMI 21.94 kg/m   Wt Readings from Last 3 Encounters:  10/31/22 138 lb (62.6 kg)  09/16/22 132 lb 9.6 oz (60.1 kg)  09/11/22 134 lb (60.8 kg)      Physical Exam Constitutional:      General: She is not in acute distress.    Appearance: Normal appearance. She is well-developed. She is not ill-appearing or toxic-appearing.     Comments: Ambulatory but in wheelchair in office.  On oxygen.  HENT:     Head: Normocephalic.     Right Ear: Hearing, tympanic membrane, ear canal and external ear normal. Tympanic membrane is not erythematous, retracted or bulging.     Left Ear: Hearing, tympanic membrane, ear canal and external ear normal. Tympanic membrane is not erythematous, retracted or bulging.     Nose: No mucosal edema or rhinorrhea.     Right Sinus: No maxillary sinus tenderness or frontal sinus tenderness.     Left Sinus: No maxillary sinus tenderness or frontal sinus tenderness.     Mouth/Throat:     Pharynx: Uvula midline.  Eyes:     General: Lids are normal. Lids are everted, no foreign bodies appreciated.     Conjunctiva/sclera: Conjunctivae normal.     Pupils: Pupils are equal, round, and reactive to light.  Neck:     Thyroid: No thyroid  mass or thyromegaly.     Vascular: No carotid bruit.     Trachea: Trachea normal.  Cardiovascular:     Rate and Rhythm: Normal rate and regular rhythm.     Pulses: Normal pulses.     Heart sounds: S1 normal and S2 normal. Murmur heard.     Systolic murmur is present.     No friction rub. No gallop.  Pulmonary:     Effort: Pulmonary effort is normal. No tachypnea or respiratory distress.     Breath sounds: Normal breath  sounds. No decreased breath sounds, wheezing, rhonchi or rales.  Abdominal:     General: Bowel sounds are normal.     Palpations: Abdomen is soft.     Tenderness: There is no abdominal tenderness.  Musculoskeletal:     Cervical back: Normal range of motion and neck supple.     Right lower leg: Edema present.     Left lower leg: Edema present.  Skin:    General: Skin is warm and dry.     Findings: No rash.  Neurological:     Mental Status: She is alert.  Psychiatric:        Mood and Affect: Mood is not anxious or depressed.        Speech: Speech normal.        Behavior: Behavior normal. Behavior is cooperative.        Thought Content: Thought content normal.        Judgment: Judgment normal.       Results for orders placed or performed in visit on 09/09/22  CUP PACEART REMOTE DEVICE CHECK  Result Value Ref Range   Date Time Interrogation Session [id number]    Pulse Generator Manufacturer MERM    Pulse Gen Model [id number] Evera XT DR    Pulse Gen Serial Number [id number]    Clinic Name [facility]    Implantable Pulse Generator Type Implantable Cardiac Defibulator    Implantable Pulse Generator Implant Date 75643329    Implantable Lead Manufacturer [facility]    Implantable Lead Model 928-829-2165 Sprint Quattro Secure    Implantable Lead Serial Number [id number]    Implantable Lead Implant Date 41660630    Implantable Lead Location [id number]    Implantable Lead Connection Status [id number]    Implantable Lead Manufacturer MERM    Implantable Lead Model 5076 CapSureFix  Novus    Implantable Lead Serial Number [id number]    Implantable Lead Implant Date 16010932    Implantable Lead Location [id number]    Implantable Lead Connection Status [id number]    Lead Channel Setting Sensing Sensitivity 0.3 mV   Lead Channel Setting Pacing Pulse Width 0.4 ms   Lead Channel Setting Pacing Amplitude 2.5 V   Zone Setting Status Inactive    Zone Setting Status Inactive    Zone Setting Status Inactive    Zone Setting Status Inactive    Zone Setting Status [phone]    Lead Channel Impedance Value 456 ohm   Lead Channel Sensing Intrinsic Amplitude 0.25 mV   Lead Channel Sensing Intrinsic Amplitude 0.375 mV   Lead Channel Impedance Value 456 ohm   Lead Channel Impedance Value 361 ohm   Lead Channel Sensing Intrinsic Amplitude 10.375 mV   Lead Channel Sensing Intrinsic Amplitude 10.375 mV   Lead Channel Pacing Threshold Amplitude 0.5 V   Lead Channel Pacing Threshold Pulse Width 0.4 ms   HighPow Impedance 41 ohm   HighPow Impedance 56 ohm   Battery Status OK    Battery Remaining Longevity 21 mo   Battery Voltage 2.92 V   Brady Statistic RA Percent Paced 0 %   Brady Statistic RV Percent Paced 90.85 %   Brady Statistic AP VP Percent 0 %   Brady Statistic AS VP Percent 0 %   Brady Statistic AP VS Percent 0 %   Brady Statistic AS VS Percent 0 %   *Note: Due to a large number of results and/or encounters for the requested time period, some results have not been displayed. A complete set of results  can be found in Results Review.    Assessment and Plan  Other fatigue -     CBC with Differential/Platelet -     IBC + Ferritin -     VITAMIN D 25 Hydroxy (Vit-D Deficiency, Fractures) -     Comprehensive metabolic panel -     TSH  History of GI bleed    No follow-ups on file.   Kerby Nora, MD

## 2022-11-01 ENCOUNTER — Telehealth (HOSPITAL_COMMUNITY): Payer: Self-pay

## 2022-11-01 DIAGNOSIS — K8689 Other specified diseases of pancreas: Secondary | ICD-10-CM | POA: Diagnosis not present

## 2022-11-01 DIAGNOSIS — R2689 Other abnormalities of gait and mobility: Secondary | ICD-10-CM | POA: Diagnosis not present

## 2022-11-01 DIAGNOSIS — I5033 Acute on chronic diastolic (congestive) heart failure: Secondary | ICD-10-CM | POA: Diagnosis not present

## 2022-11-01 DIAGNOSIS — Z4789 Encounter for other orthopedic aftercare: Secondary | ICD-10-CM | POA: Diagnosis not present

## 2022-11-01 DIAGNOSIS — J449 Chronic obstructive pulmonary disease, unspecified: Secondary | ICD-10-CM | POA: Diagnosis not present

## 2022-11-01 DIAGNOSIS — M6281 Muscle weakness (generalized): Secondary | ICD-10-CM | POA: Diagnosis not present

## 2022-11-01 DIAGNOSIS — J90 Pleural effusion, not elsewhere classified: Secondary | ICD-10-CM | POA: Diagnosis not present

## 2022-11-01 NOTE — Telephone Encounter (Signed)
Oxygen and requested documents faxed to [facility] on 11/01/22 at [phone]

## 2022-11-04 ENCOUNTER — Other Ambulatory Visit: Payer: Self-pay

## 2022-11-04 ENCOUNTER — Emergency Department (HOSPITAL_COMMUNITY): Payer: PPO

## 2022-11-04 ENCOUNTER — Encounter (HOSPITAL_COMMUNITY): Payer: Self-pay

## 2022-11-04 ENCOUNTER — Inpatient Hospital Stay (HOSPITAL_COMMUNITY)
Admission: EM | Admit: 2022-11-04 | Discharge: 2022-11-09 | DRG: 377 | Disposition: A | Discharge status: Home / Self Care | Payer: PPO | Attending: Internal Medicine | Admitting: Internal Medicine

## 2022-11-04 ENCOUNTER — Telehealth: Payer: Self-pay

## 2022-11-04 DIAGNOSIS — I739 Peripheral vascular disease, unspecified: Secondary | ICD-10-CM | POA: Diagnosis not present

## 2022-11-04 DIAGNOSIS — I428 Other cardiomyopathies: Secondary | ICD-10-CM | POA: Diagnosis present

## 2022-11-04 DIAGNOSIS — I4821 Permanent atrial fibrillation: Secondary | ICD-10-CM | POA: Diagnosis not present

## 2022-11-04 DIAGNOSIS — Z888 Allergy status to other drugs, medicaments and biological substances status: Secondary | ICD-10-CM

## 2022-11-04 DIAGNOSIS — M19041 Primary osteoarthritis, right hand: Secondary | ICD-10-CM | POA: Diagnosis present

## 2022-11-04 DIAGNOSIS — K921 Melena: Secondary | ICD-10-CM

## 2022-11-04 DIAGNOSIS — D649 Anemia, unspecified: Secondary | ICD-10-CM | POA: Diagnosis not present

## 2022-11-04 DIAGNOSIS — J9611 Chronic respiratory failure with hypoxia: Secondary | ICD-10-CM | POA: Diagnosis not present

## 2022-11-04 DIAGNOSIS — D62 Acute posthemorrhagic anemia: Secondary | ICD-10-CM | POA: Diagnosis not present

## 2022-11-04 DIAGNOSIS — E7800 Pure hypercholesterolemia, unspecified: Secondary | ICD-10-CM | POA: Diagnosis present

## 2022-11-04 DIAGNOSIS — J4489 Other specified chronic obstructive pulmonary disease: Secondary | ICD-10-CM | POA: Diagnosis present

## 2022-11-04 DIAGNOSIS — Z885 Allergy status to narcotic agent status: Secondary | ICD-10-CM

## 2022-11-04 DIAGNOSIS — Z96643 Presence of artificial hip joint, bilateral: Secondary | ICD-10-CM | POA: Diagnosis present

## 2022-11-04 DIAGNOSIS — Z66 Do not resuscitate: Secondary | ICD-10-CM | POA: Diagnosis not present

## 2022-11-04 DIAGNOSIS — Z85528 Personal history of other malignant neoplasm of kidney: Secondary | ICD-10-CM

## 2022-11-04 DIAGNOSIS — E039 Hypothyroidism, unspecified: Secondary | ICD-10-CM | POA: Diagnosis not present

## 2022-11-04 DIAGNOSIS — K5520 Angiodysplasia of colon without hemorrhage: Secondary | ICD-10-CM | POA: Diagnosis present

## 2022-11-04 DIAGNOSIS — Z8507 Personal history of malignant neoplasm of pancreas: Secondary | ICD-10-CM

## 2022-11-04 DIAGNOSIS — N183 Chronic kidney disease, stage 3 unspecified: Secondary | ICD-10-CM | POA: Diagnosis not present

## 2022-11-04 DIAGNOSIS — J449 Chronic obstructive pulmonary disease, unspecified: Secondary | ICD-10-CM | POA: Diagnosis not present

## 2022-11-04 DIAGNOSIS — I2510 Atherosclerotic heart disease of native coronary artery without angina pectoris: Secondary | ICD-10-CM | POA: Diagnosis present

## 2022-11-04 DIAGNOSIS — Z7984 Long term (current) use of oral hypoglycemic drugs: Secondary | ICD-10-CM

## 2022-11-04 DIAGNOSIS — I5033 Acute on chronic diastolic (congestive) heart failure: Secondary | ICD-10-CM | POA: Diagnosis present

## 2022-11-04 DIAGNOSIS — Z87891 Personal history of nicotine dependence: Secondary | ICD-10-CM | POA: Diagnosis not present

## 2022-11-04 DIAGNOSIS — M19042 Primary osteoarthritis, left hand: Secondary | ICD-10-CM | POA: Diagnosis not present

## 2022-11-04 DIAGNOSIS — K209 Esophagitis, unspecified without bleeding: Secondary | ICD-10-CM | POA: Diagnosis not present

## 2022-11-04 DIAGNOSIS — Z95810 Presence of automatic (implantable) cardiac defibrillator: Secondary | ICD-10-CM

## 2022-11-04 DIAGNOSIS — Z882 Allergy status to sulfonamides status: Secondary | ICD-10-CM

## 2022-11-04 DIAGNOSIS — D500 Iron deficiency anemia secondary to blood loss (chronic): Secondary | ICD-10-CM

## 2022-11-04 DIAGNOSIS — I130 Hypertensive heart and chronic kidney disease with heart failure and stage 1 through stage 4 chronic kidney disease, or unspecified chronic kidney disease: Secondary | ICD-10-CM | POA: Diagnosis present

## 2022-11-04 DIAGNOSIS — Z905 Acquired absence of kidney: Secondary | ICD-10-CM

## 2022-11-04 DIAGNOSIS — E8770 Fluid overload, unspecified: Secondary | ICD-10-CM | POA: Diagnosis not present

## 2022-11-04 DIAGNOSIS — I10 Essential (primary) hypertension: Secondary | ICD-10-CM | POA: Diagnosis not present

## 2022-11-04 DIAGNOSIS — K5730 Diverticulosis of large intestine without perforation or abscess without bleeding: Secondary | ICD-10-CM | POA: Diagnosis not present

## 2022-11-04 DIAGNOSIS — Z85828 Personal history of other malignant neoplasm of skin: Secondary | ICD-10-CM | POA: Diagnosis not present

## 2022-11-04 DIAGNOSIS — D72819 Decreased white blood cell count, unspecified: Secondary | ICD-10-CM | POA: Diagnosis present

## 2022-11-04 DIAGNOSIS — Z8616 Personal history of COVID-19: Secondary | ICD-10-CM | POA: Diagnosis not present

## 2022-11-04 DIAGNOSIS — K922 Gastrointestinal hemorrhage, unspecified: Secondary | ICD-10-CM | POA: Diagnosis not present

## 2022-11-04 DIAGNOSIS — D3A8 Other benign neuroendocrine tumors: Secondary | ICD-10-CM | POA: Diagnosis not present

## 2022-11-04 DIAGNOSIS — I5032 Chronic diastolic (congestive) heart failure: Secondary | ICD-10-CM | POA: Diagnosis not present

## 2022-11-04 DIAGNOSIS — Z803 Family history of malignant neoplasm of breast: Secondary | ICD-10-CM

## 2022-11-04 DIAGNOSIS — K769 Liver disease, unspecified: Secondary | ICD-10-CM | POA: Diagnosis not present

## 2022-11-04 DIAGNOSIS — K449 Diaphragmatic hernia without obstruction or gangrene: Secondary | ICD-10-CM | POA: Diagnosis not present

## 2022-11-04 DIAGNOSIS — I4891 Unspecified atrial fibrillation: Secondary | ICD-10-CM | POA: Diagnosis not present

## 2022-11-04 DIAGNOSIS — M7989 Other specified soft tissue disorders: Secondary | ICD-10-CM | POA: Diagnosis not present

## 2022-11-04 DIAGNOSIS — K8689 Other specified diseases of pancreas: Secondary | ICD-10-CM | POA: Diagnosis not present

## 2022-11-04 DIAGNOSIS — K5521 Angiodysplasia of colon with hemorrhage: Principal | ICD-10-CM | POA: Diagnosis present

## 2022-11-04 DIAGNOSIS — Z7901 Long term (current) use of anticoagulants: Secondary | ICD-10-CM | POA: Diagnosis not present

## 2022-11-04 DIAGNOSIS — R29898 Other symptoms and signs involving the musculoskeletal system: Secondary | ICD-10-CM | POA: Diagnosis not present

## 2022-11-04 DIAGNOSIS — Z9049 Acquired absence of other specified parts of digestive tract: Secondary | ICD-10-CM

## 2022-11-04 DIAGNOSIS — K31819 Angiodysplasia of stomach and duodenum without bleeding: Secondary | ICD-10-CM | POA: Diagnosis not present

## 2022-11-04 DIAGNOSIS — J90 Pleural effusion, not elsewhere classified: Secondary | ICD-10-CM | POA: Diagnosis not present

## 2022-11-04 DIAGNOSIS — Z79899 Other long term (current) drug therapy: Secondary | ICD-10-CM | POA: Diagnosis not present

## 2022-11-04 DIAGNOSIS — N1832 Chronic kidney disease, stage 3b: Secondary | ICD-10-CM | POA: Diagnosis not present

## 2022-11-04 DIAGNOSIS — I272 Pulmonary hypertension, unspecified: Secondary | ICD-10-CM | POA: Diagnosis present

## 2022-11-04 DIAGNOSIS — Z8249 Family history of ischemic heart disease and other diseases of the circulatory system: Secondary | ICD-10-CM

## 2022-11-04 DIAGNOSIS — K2289 Other specified disease of esophagus: Secondary | ICD-10-CM | POA: Diagnosis not present

## 2022-11-04 DIAGNOSIS — Z9981 Dependence on supplemental oxygen: Secondary | ICD-10-CM

## 2022-11-04 DIAGNOSIS — K7460 Unspecified cirrhosis of liver: Secondary | ICD-10-CM | POA: Diagnosis not present

## 2022-11-04 DIAGNOSIS — Z880 Allergy status to penicillin: Secondary | ICD-10-CM

## 2022-11-04 HISTORY — DX: Other specified abnormal immunological findings in serum: R76.8

## 2022-11-04 HISTORY — DX: Chronic kidney disease, stage 3 unspecified: N18.30

## 2022-11-04 HISTORY — DX: Other specified disorders of veins: I87.8

## 2022-11-04 HISTORY — DX: Dependence on supplemental oxygen: Z99.81

## 2022-11-04 HISTORY — DX: Malignant neoplasm of unspecified kidney, except renal pelvis: C64.9

## 2022-11-04 HISTORY — DX: Iron deficiency anemia, unspecified: D50.9

## 2022-11-04 HISTORY — DX: Peripheral vascular disease, unspecified: I73.9

## 2022-11-04 HISTORY — DX: Unspecified diastolic (congestive) heart failure: I50.30

## 2022-11-04 HISTORY — DX: Malignant neoplasm of pancreas, unspecified: C25.9

## 2022-11-04 HISTORY — DX: Chronic obstructive pulmonary disease, unspecified: J44.9

## 2022-11-04 HISTORY — DX: Nonrheumatic aortic (valve) stenosis: I35.0

## 2022-11-04 HISTORY — DX: Adverse effect of other narcotics, sequela: T40.695S

## 2022-11-04 HISTORY — DX: Pulmonary hypertension, unspecified: I27.20

## 2022-11-04 HISTORY — DX: Other specified abnormal immunological findings in serum: R76.89

## 2022-11-04 HISTORY — DX: Permanent atrial fibrillation: I48.21

## 2022-11-04 LAB — CBC
HCT: 21.9 % — ABNORMAL LOW (ref 36.0–46.0)
Hemoglobin: 6.5 g/dL — CL (ref 12.0–15.0)
MCH: 26.5 pg (ref 26.0–34.0)
MCHC: 29.7 g/dL — ABNORMAL LOW (ref 30.0–36.0)
MCV: 89.4 fL (ref 80.0–100.0)
Platelets: 187 10*3/uL (ref 150–400)
RBC: 2.45 MIL/uL — ABNORMAL LOW (ref 3.87–5.11)
RDW: 18.3 % — ABNORMAL HIGH (ref 11.5–15.5)
WBC: 3.7 10*3/uL — ABNORMAL LOW (ref 4.0–10.5)
nRBC: 0 % (ref 0.0–0.2)

## 2022-11-04 LAB — BASIC METABOLIC PANEL
Anion gap: 11 (ref 5–15)
BUN: 48 mg/dL — ABNORMAL HIGH (ref 8–23)
CO2: 28 mmol/L (ref 22–32)
Calcium: 9.2 mg/dL (ref 8.9–10.3)
Chloride: 100 mmol/L (ref 98–111)
Creatinine, Ser: 1.62 mg/dL — ABNORMAL HIGH (ref 0.44–1.00)
GFR, Estimated: 31 mL/min — ABNORMAL LOW (ref >60)
Glucose, Bld: 135 mg/dL — ABNORMAL HIGH (ref 70–99)
Potassium: 3.8 mmol/L (ref 3.5–5.1)
Sodium: 139 mmol/L (ref 135–145)

## 2022-11-04 LAB — PREPARE RBC (CROSSMATCH)

## 2022-11-04 MED ORDER — METHYLPREDNISOLONE SODIUM SUCC 40 MG IJ SOLR
40.0000 mg | Freq: Once | INTRAMUSCULAR | Status: AC
  Administered 2022-11-05: 40 mg via INTRAVENOUS
  Filled 2022-11-04: qty 1

## 2022-11-04 MED ORDER — ACETAMINOPHEN 325 MG PO TABS
650.0000 mg | ORAL_TABLET | Freq: Once | ORAL | Status: AC
  Administered 2022-11-05: 650 mg via ORAL
  Filled 2022-11-04: qty 2

## 2022-11-04 MED ORDER — PROCHLORPERAZINE EDISYLATE 10 MG/2ML IJ SOLN
5.0000 mg | Freq: Four times a day (QID) | INTRAMUSCULAR | Status: DC | PRN
  Administered 2022-11-05: 5 mg via INTRAVENOUS
  Filled 2022-11-04 (×2): qty 1

## 2022-11-04 MED ORDER — ACETAMINOPHEN 325 MG PO TABS
650.0000 mg | ORAL_TABLET | Freq: Four times a day (QID) | ORAL | Status: DC | PRN
  Administered 2022-11-05 – 2022-11-08 (×4): 650 mg via ORAL
  Filled 2022-11-04 (×4): qty 2

## 2022-11-04 MED ORDER — SODIUM CHLORIDE 0.9% IV SOLUTION: Freq: Once | INTRAVENOUS | Status: DC

## 2022-11-04 MED ORDER — IOHEXOL 350 MG/ML SOLN
55.0000 mL | Freq: Once | INTRAVENOUS | Status: AC | PRN
  Administered 2022-11-04: 55 mL via INTRAVENOUS

## 2022-11-04 MED ORDER — DIPHENHYDRAMINE HCL 25 MG PO CAPS
50.0000 mg | ORAL_CAPSULE | Freq: Once | ORAL | Status: AC
  Administered 2022-11-05: 50 mg via ORAL
  Filled 2022-11-04: qty 2

## 2022-11-04 MED ORDER — MELATONIN 5 MG PO TABS: 5.0000 mg | ORAL_TABLET | Freq: Every evening | ORAL | Status: DC | PRN

## 2022-11-04 MED ORDER — LACTATED RINGERS IV BOLUS
500.0000 mL | Freq: Once | INTRAVENOUS | Status: AC
  Administered 2022-11-04: 500 mL via INTRAVENOUS

## 2022-11-04 MED ORDER — LACTATED RINGERS IV BOLUS: 1000.0000 mL | Freq: Once | INTRAVENOUS | Status: DC

## 2022-11-04 MED ORDER — PANTOPRAZOLE SODIUM 40 MG IV SOLR
40.0000 mg | Freq: Once | INTRAVENOUS | Status: AC
  Administered 2022-11-04: 40 mg via INTRAVENOUS

## 2022-11-04 MED ORDER — PANTOPRAZOLE SODIUM 40 MG IV SOLR
40.0000 mg | Freq: Two times a day (BID) | INTRAVENOUS | Status: DC
  Administered 2022-11-05 – 2022-11-06 (×3): 40 mg via INTRAVENOUS
  Filled 2022-11-04 (×3): qty 10

## 2022-11-04 NOTE — ED Provider Notes (Signed)
Reviewed the patient's kidney function and the benefits of CT scan with contrast outweigh the risk of further kidney infarction, consistent with current literature.    Fayrene Helper, MD 11/04/22 Reece Leader, MD 11/05/22 [phone]

## 2022-11-04 NOTE — Telephone Encounter (Signed)
Patient called stating she had her labs drawn last week at her PCP and her hgb. 8.8. she stated they told her they would contact the the cancer center and get back to her after they have talked to [place]. She waited thru the weekend and has not heard anything from them. She is having dark stools and feeling very lethargic. Called in this morning, I spoke with [name] and Fleet Contras in [facility] and [name] stated she needed to be seen in the ER. Spoke to the patient and she stated she would have her family take her to the ER.

## 2022-11-04 NOTE — ED Provider Notes (Signed)
[facility] Provider Note   CSN: [id number] Arrival date & time: 11/04/22  1349     History {Add pertinent medical, surgical, social history, OB history to HPI:1} Chief Complaint  Patient presents with   Low Hgb   Weakness    [name] is a 89 y.o. female.  89 year old female with a history of pancreatic cancer here for generalized weakness.  She reports that she has been having some weakness over the last couple weeks.  She also reports that she has had a drop in her hemoglobin which is noticed at her outpatient providers office.  She does report that she has had some black and tarry stools over the last two to three weeks.  She does report that she had a similar incident back in October, and Dettinger was diagnosed with pancreatic cancer.  She did require blood transfusion at that time however she had a transfusion reaction and states that she has a "cocktail of medication" that she typically takes before transfusion that works well for her.  She reports some orthostatic symptoms.  The history is provided by the patient and a relative.  Weakness Associated symptoms: abdominal pain and nausea   Associated symptoms: no diarrhea, no dizziness, no dysuria, no shortness of breath and no vomiting        Home Medications Prior to Admission medications   Medication Sig Start Date End Date Taking? Authorizing Provider  acetaminophen (TYLENOL) 500 MG tablet Take 500-1,000 mg by mouth every 8 (eight) hours as needed for mild pain or headache.    [provider]  Biotin 1000 MCG CHEW Chew 2 1e11 Vector Genomes by mouth daily.    [provider]  Cyanocobalamin (B-12 PO) Take 1 tablet by mouth daily.    [provider]  dapagliflozin propanediol (FARXIGA) 10 MG TABS tablet Take 1 tablet (10 mg total) by mouth daily before breakfast. 06/10/22   Laurey Morale, MD  ELIQUIS 2.5 MG TABS tablet TAKE ONE TABLET BY MOUTH  TWICE A DAY 07/25/22   Laurey Morale, MD  fluticasone [facility]) 50 MCG/ACT nasal spray Place 2 sprays into both nostrils daily as needed for allergies or rhinitis. 07/04/22   [name], MD  gabapentin (NEURONTIN) 100 MG capsule Take 1 capsule (100 mg total) by mouth at bedtime for 3 days, THEN 1 capsule (100 mg total) 2 (two) times daily for 3 days, THEN 1 capsule (100 mg total) 3 (three) times daily for 3 days. Patient not taking: Reported on 09/16/2022 09/11/22 09/20/22  [name], MD  ondansetron [facility]) 4 MG tablet Take 1 tablet (4 mg total) by mouth every 8 (eight) hours as needed for nausea or vomiting. 09/20/22   [name], MD  pantoprazole (PROTONIX) 40 MG tablet Take 1 tablet (40 mg total) by mouth daily. 05/28/22   [name], MD  potassium chloride SA (KLOR-CON M) 20 MEQ tablet Take 2 tablets (40 mEq total) by mouth daily. 08/21/22   [place], Anderson Malta, FNP  rosuvastatin (CRESTOR) 40 MG tablet Take 1 tablet (40 mg total) by mouth daily. 05/02/22   [name], MD  torsemide (DEMADEX) 20 MG tablet Take 4 tablets (80 mg total) by mouth 2 (two) times daily. 08/21/22   [place], Anderson Malta, FNP  vitamin C (ASCORBIC ACID) 250 MG tablet Take 500 mg by mouth daily.    [provider]  VITAMIN D PO Take 4,000 Units by mouth daily.    [provider]      Allergies    Dilaudid [hydromorphone], Fentanyl, Codeine, Morphine and codeine, Amoxicillin, Hydrocodone, Oxycodone, Tramadol, Whole blood, and Sulfonamide derivatives    Review of Systems   Review of Systems  Constitutional:  Positive for activity change, appetite change and fatigue.  Respiratory:  Negative for chest tightness and shortness of breath.   Gastrointestinal:  Positive for abdominal pain, blood in stool and nausea. Negative for constipation, diarrhea, rectal pain and vomiting.  Genitourinary:  Negative for dysuria, vaginal bleeding and vaginal discharge.  Neurological:  Positive for  syncope, weakness and light-headedness. Negative for dizziness.    Physical Exam Updated Vital Signs BP (!) 114/58   Pulse 69   Temp 98.2 F (36.8 C) (Oral)   Resp 19   SpO2 100%  Physical Exam Vitals and nursing note reviewed.  Constitutional:      General: She is awake. She is not in acute distress.    Appearance: Normal appearance. She is normal weight. She is ill-appearing. She is not toxic-appearing or diaphoretic.  HENT:     Head: Normocephalic.     Mouth/Throat:     Mouth: Mucous membranes are moist.     Pharynx: Oropharynx is clear.  Eyes:     Extraocular Movements: Extraocular movements intact.     Conjunctiva/sclera: Conjunctivae normal.     Pupils: Pupils are equal, round, and reactive to light.  Cardiovascular:     Rate and Rhythm: Normal rate and regular rhythm.     Pulses: Normal pulses.  Pulmonary:     Effort: Pulmonary effort is normal.     Breath sounds: Normal breath sounds.  Abdominal:     General: Abdomen is flat. There is no distension.     Palpations: Abdomen is soft.     Tenderness: There is no abdominal tenderness. There is no guarding or rebound.  Musculoskeletal:     Right lower leg: Edema present.     Left lower leg: Edema present.  Skin:    General: Skin is warm.     Coloration: Skin is pale.     Findings: No rash.  Neurological:     Mental Status: She is alert and oriented to person, place, and time.  Psychiatric:        Behavior: Behavior is cooperative.     ED Results / Procedures / Treatments   Labs (all labs ordered are listed, but only abnormal results are displayed) Labs Reviewed  BASIC METABOLIC PANEL - Abnormal; Notable for the following components:      Result Value   Glucose, Bld 135 (*)    BUN 48 (*)    Creatinine, Ser 1.62 (*)    GFR, Estimated 31 (*)    All other components within normal limits  CBC - Abnormal; Notable for the following components:   WBC 3.7 (*)    RBC 2.45 (*)    Hemoglobin 6.5 (*)    HCT 21.9  (*)    MCHC 29.7 (*)    RDW 18.3 (*)    All other components within normal limits    EKG None  Radiology No results found.  Procedures Procedures  {Document cardiac monitor, telemetry assessment procedure when appropriate:1}  Medications Ordered in ED Medications - No data to display  ED Course/ Medical Decision Making/ A&P   {   Click here for ABCD2, HEART and other calculatorsREFRESH Note before signing :1}  Medical Decision Making Amount and/or Complexity of Data Reviewed Labs: ordered. Radiology: ordered.   ***  {Document critical care time when appropriate:1} {Document review of labs and clinical decision tools ie heart score, Chads2Vasc2 etc:1}  {Document your independent review of radiology images, and any outside records:1} {Document your discussion with family members, caretakers, and with consultants:1} {Document social determinants of health affecting pt's care:1} {Document your decision making why or why not admission, treatments were needed:1} Final Clinical Impression(s) / ED Diagnoses Final diagnoses:  None    Rx / DC Orders ED Discharge Orders     None

## 2022-11-04 NOTE — ED Triage Notes (Signed)
Pt came in via POV d/t her hemoglobin dropping from 10 to 8 when her Hemoglobin was checked & her provider advised her to come into ED for evaluation. Reports she has been feeling weak & thinks she fainted the other day. A/Ox4, States she has CA on her pancreas & a Hx of low hgb.

## 2022-11-04 NOTE — ED Notes (Signed)
ED TO INPATIENT HANDOFF REPORT  ED Nurse Name and Phone #: Dahlia Client [phone]  D Name/Age/Gender [name] 89 y.o. female Room/Bed: 015C/015C  Code Status   Code Status: Full Code  Home/SNF/Other Home Patient oriented to: self, place, time, and situation Is this baseline? Yes   Triage Complete: Triage complete  Chief Complaint Symptomatic anemia [D64.9]  Triage Note Pt came in via POV d/t her hemoglobin dropping from 10 to 8 when her Hemoglobin was checked & her provider advised her to come into ED for evaluation. Reports she has been feeling weak & thinks she fainted the other day. A/Ox4, States she has CA on her pancreas & a Hx of low hgb.    Allergies Allergies  Allergen Reactions   Dilaudid [Hydromorphone] Other (See Comments)    Excessive Somnolence- Required Narcan   Fentanyl Other (See Comments)    Required Narcan when given with Dilaudid. Tolerated single dose when given during procedure 02/03/2019.    Codeine Nausea And Vomiting    Hallucinations    Morphine And Codeine Nausea And Vomiting    Hallucinations    Amoxicillin Nausea Only   Hydrocodone Nausea Only and Other (See Comments)    Nausea and lethargy   Oxycodone Nausea Only and Other (See Comments)    Nausea and lethargy   Tramadol Nausea Only and Other (See Comments)    Nausea and lethargy   Whole Blood Itching and Nausea And Vomiting    Pt and family stated pt will receive blood products but had a reaction requiring benadryl on a previous administration.  Needs premedication with tylenol, benadryl, steroids for future transfusions    Sulfonamide Derivatives Other (See Comments)    Dry mouth    Level of Care/Admitting Diagnosis ED Disposition     ED Disposition  Admit   Condition  --   Comment  Hospital Area: [facility] [[id number]]  Level of Care: Progressive [102]  Admit to Progressive based on following criteria: GI, ENDOCRINE disease patients with GI bleeding, acute liver  failure or pancreatitis, stable with diabetic ketoacidosis or thyrotoxicosis (hypothyroid) state.  May admit patient to [facility] or [facility] if equivalent level of care is available:: Yes  Covid Evaluation: Asymptomatic - no recent exposure (last 10 days) testing not required  Diagnosis: Symptomatic anemia [6387564]  Admitting Physician: Darlin Drop [[id number]]  Attending Physician: Darlin Drop [[id number]]  Certification:: I certify this patient will need inpatient services for at least 2 midnights  Estimated Length of Stay: 2          B Medical/Surgery History Past Medical History:  Diagnosis Date   Actinic keratosis 01/17/2015   R forearm   Adjustment disorder with anxiety    Anemia    Anginal pain (HCC)    Arthritis    "some in my hands" (11/11/2012)   Atrial fibrillation (HCC)    Atypical mole 03/25/2018   L forearm - severe   Automatic implantable cardioverter-defibrillator in situ    Avascular necrosis of hip (HCC) 05/03/2011   Blood in stool    Carotid artery stenosis 09/2007   a. 09/2007: 60-79% bilateral (stable); b. 10/2008: 40-59% R 60-79%    Cellulitis of left lower extremity 07/13/2020   Chest pain, unspecified    Chronic airway obstruction, not elsewhere classified    Chronic bronchitis (HCC)    "get it some; not q year" (11/11/2012)   Chronic diastolic CHF (congestive heart failure) (HCC) 06/04/2013   Chronic kidney disease,  unspecified    Coronary artery disease    non-obstructive by 2006 cath   COVID-19 virus infection 02/12/2022   Displaced fracture of left femoral neck (HCC) 10/09/2018   Frequent UTI    "get them a couple times/yr" (11/11/2012)   GI bleed 03/28/2020   Hemorrhage of rectum and anus    High cholesterol    History of blood transfusion 04/2011   "after hip OR" (11/11/2012)   Hx of renal cell cancer    Hypertension 05/20/2011   Hypertr obst cardiomyop    Hypotension, unspecified    cardiac cath 2006..nonobstructive CAD 30-40s  lesions.Marland KitchenETT 1/09 nondiagnostic due to poor HR response..Right Renal Cancer 2003   Long term (current) use of anticoagulants    Malignant neoplasm of kidney, except pelvis    Osteoarthritis of right hip    Other and unspecified coagulation defects    PONV (postoperative nausea and vomiting)    Presence of permanent cardiac pacemaker    RECTAL BLEEDING 10/13/2009   Qualifier: Diagnosis of  By: Wilmon Pali NP, Paula     Renal cancer (HCC) 06/2001   Right   Secondary cardiomyopathy, unspecified    Sinus bradycardia    Squamous cell carcinoma of skin 01/17/2015   R lat wrist   Squamous cell carcinoma of skin 01/29/2018   R post upper leg - superficially invasive   Squamous cell carcinoma of skin 03/25/2018   L lat foot   Squamous cell carcinoma of skin 11/09/2020   left lat foot - EDC 01/01/21, recurrent 01/31/21 - MOHs 02/22/21   Squamous cell carcinoma of skin 12/19/2021   Right Posterior Medial Thigh, EDC   Squamous cell carcinoma of skin 12/19/2021   SCC IS, L lat heel, EDC 02/04/2022   Squamous cell carcinoma of skin 01/21/2022   L forearm, EDC 02/04/2022   Squamous cell carcinoma of skin 01/21/2022   R lower leg below knee, EDC 02/04/2022   Squamous cell carcinoma of skin 01/21/2022   SCCIS, R post heel, EDC 02/04/2022   Squamous cell carcinoma of skin 07/01/2022   left lower abdomen, in situ, EDC   Squamous cell carcinoma of skin 07/01/2022   left medial chest, EDC   Urge incontinence    Past Surgical History:  Procedure Laterality Date   ABDOMINAL AORTOGRAM W/LOWER EXTREMITY N/A 02/12/2021   Procedure: ABDOMINAL AORTOGRAM W/LOWER EXTREMITY;  Surgeon: Maeola Harman, MD;  Location: [facility] INVASIVE CV LAB;  Service: Cardiovascular;  Laterality: N/A;   ABDOMINAL HYSTERECTOMY  1975   for benign causes   APPENDECTOMY     BI-VENTRICULAR PACEMAKER UPGRADE  05/04/2010   BIOPSY  02/28/2019   Procedure: BIOPSY;  Surgeon: Rachael Fee, MD;  Location: [facility] ENDOSCOPY;   Service: Endoscopy;;   BIOPSY  03/04/2022   Procedure: BIOPSY;  Surgeon: Lemar Lofty., MD;  Location: [facility] ENDOSCOPY;  Service: Gastroenterology;;   CARDIAC CATHETERIZATION  2006   CARDIOVERSION N/A 02/20/2018   Procedure: CARDIOVERSION;  Surgeon: Antonieta Iba, MD;  Location: [facility] ORS;  Service: Cardiovascular;  Laterality: N/A;   CARDIOVERSION N/A 03/27/2018   Procedure: CARDIOVERSION;  Surgeon: Antonieta Iba, MD;  Location: [facility] ORS;  Service: Cardiovascular;  Laterality: N/A;   CATARACT EXTRACTION W/ INTRAOCULAR LENS  IMPLANT, BILATERAL  01/2006-02-2006   CHOLECYSTECTOMY N/A 11/11/2012   Procedure: LAPAROSCOPIC CHOLECYSTECTOMY WITH INTRAOPERATIVE CHOLANGIOGRAM;  Surgeon: Wilmon Arms. Corliss Skains, MD;  Location: [facility] OR;  Service: General;  Laterality: N/A;   COLONOSCOPY WITH PROPOFOL N/A 02/28/2019   Procedure: COLONOSCOPY WITH PROPOFOL;  Surgeon: Rachael Fee, MD;  Location: [facility] ENDOSCOPY;  Service: Endoscopy;  Laterality: N/A;   ENTEROSCOPY N/A 03/30/2020   Procedure: ENTEROSCOPY;  Surgeon: Shellia Cleverly, DO;  Location: [facility] ENDOSCOPY;  Service: Gastroenterology;  Laterality: N/A;   ENTEROSCOPY N/A 02/10/2021   Procedure: ENTEROSCOPY;  Surgeon: Jeani Hawking, MD;  Location[facility] ENDOSCOPY;  Service: Endoscopy;  Laterality: N/A;   ENTEROSCOPY N/A 02/15/2022   Procedure: ENTEROSCOPY;  Surgeon: Imogene Burn, MD;  Location: [facility] ENDOSCOPY;  Service: Gastroenterology;  Laterality: N/A;   EP IMPLANTABLE DEVICE N/A 02/21/2016   Procedure: ICD Generator Changeout;  Surgeon: Duke Salvia, MD;  Location: [facility] INVASIVE CV LAB;  Service: Cardiovascular;  Laterality: N/A;   ESOPHAGOGASTRODUODENOSCOPY N/A 03/04/2022   Procedure: ESOPHAGOGASTRODUODENOSCOPY (EGD);  Surgeon: Lemar Lofty., MD;  Location: [facility] ENDOSCOPY;  Service: Gastroenterology;  Laterality: N/A;   ESOPHAGOGASTRODUODENOSCOPY (EGD) WITH PROPOFOL N/A 02/28/2019   Procedure: ESOPHAGOGASTRODUODENOSCOPY (EGD) WITH PROPOFOL;   Surgeon: Rachael Fee, MD;  Location: [facility] ENDOSCOPY;  Service: Endoscopy;  Laterality: N/A;   EUS N/A 03/04/2022   Procedure: UPPER ENDOSCOPIC ULTRASOUND (EUS) RADIAL;  Surgeon: Lemar Lofty., MD;  Location: [facility] ENDOSCOPY;  Service: Gastroenterology;  Laterality: N/A;   FINE NEEDLE ASPIRATION N/A 03/04/2022   Procedure: FINE NEEDLE ASPIRATION (FNA) LINEAR;  Surgeon: Lemar Lofty., MD;  Location: [facility] ENDOSCOPY;  Service: Gastroenterology;  Laterality: N/A;   GIVENS CAPSULE STUDY N/A 03/15/2019   Procedure: GIVENS CAPSULE STUDY;  Surgeon: Tressia Danas, MD;  Location: [facility] ENDOSCOPY;  Service: Gastroenterology;  Laterality: N/A;   HOT HEMOSTASIS N/A 03/30/2020   Procedure: HOT HEMOSTASIS (ARGON PLASMA COAGULATION/BICAP);  Surgeon: Shellia Cleverly, DO;  Location: [facility] ENDOSCOPY;  Service: Gastroenterology;  Laterality: N/A;   HOT HEMOSTASIS N/A 02/10/2021   Procedure: HOT HEMOSTASIS (ARGON PLASMA COAGULATION/BICAP);  Surgeon: Jeani Hawking, MD;  Location: [facility] ENDOSCOPY;  Service: Endoscopy;  Laterality: N/A;   HOT HEMOSTASIS N/A 02/15/2022   Procedure: HOT HEMOSTASIS (ARGON PLASMA COAGULATION/BICAP);  Surgeon: Imogene Burn, MD;  Location: [facility] ENDOSCOPY;  Service: Gastroenterology;  Laterality: N/A;   INSERT / REPLACE / REMOVE PACEMAKER  05-01-11   02-28-05-/05-04-10-ICD-MEDTRONIC MAXIMAL DR   IR ANGIOGRAM SELECTIVE EACH ADDITIONAL VESSEL  02/18/2022   IR ANGIOGRAM SELECTIVE EACH ADDITIONAL VESSEL  02/18/2022   IR ANGIOGRAM VISCERAL SELECTIVE  02/16/2022   IR ANGIOGRAM VISCERAL SELECTIVE  02/16/2022   IR EMBO ART  VEN HEMORR LYMPH EXTRAV  INC GUIDE ROADMAPPING  02/16/2022   IR THORACENTESIS ASP PLEURAL SPACE W/IMG GUIDE  02/21/2022   IR THORACENTESIS ASP PLEURAL SPACE W/IMG GUIDE  02/22/2022   IR US GUIDE VASC ACCESS RIGHT  02/16/2022   JOINT REPLACEMENT     LAPAROSCOPIC CHOLECYSTECTOMY  11/11/2012   LAPAROSCOPIC LYSIS OF ADHESIONS N/A 11/11/2012   Procedure: LAPAROSCOPIC  LYSIS OF ADHESIONS;  Surgeon: Wilmon Arms. Corliss Skains, MD;  Location: [facility] OR;  Service: General;  Laterality: N/A;   NEPHRECTOMY Right 06/2001    S/P RENAL CELL CANCER   PERIPHERAL VASCULAR INTERVENTION Bilateral 02/12/2021   Procedure: PERIPHERAL VASCULAR INTERVENTION;  Surgeon: Maeola Harman, MD;  Location: [facility] INVASIVE CV LAB;  Service: Cardiovascular;  Laterality: Bilateral;  Iliac artery stents   PRESSURE SENSOR/CARDIOMEMS N/A 02/03/2019   Procedure: PRESSURE SENSOR/CARDIOMEMS;  Surgeon: Laurey Morale, MD;  Location: [facility] INVASIVE CV LAB;  Service: Cardiovascular;  Laterality: N/A;   RIGHT HEART CATH N/A 11/09/2018   Procedure: RIGHT HEART CATH;  Surgeon: Laurey Morale, MD;  Location: [facility] INVASIVE CV LAB;  Service: Cardiovascular;  Laterality: N/A;   RIGHT HEART CATH N/A 03/08/2019   Procedure: RIGHT HEART CATH;  Surgeon: Laurey Morale, MD;  Location: [facility] INVASIVE CV LAB;  Service: Cardiovascular;  Laterality: N/A;   SUBMUCOSAL TATTOO INJECTION  02/28/2019   Procedure: SUBMUCOSAL TATTOO INJECTION;  Surgeon: Rachael Fee, MD;  Location: [facility] ENDOSCOPY;  Service: Endoscopy;;   TOTAL HIP ARTHROPLASTY Right 05/03/2011   Procedure: TOTAL HIP ARTHROPLASTY ANTERIOR APPROACH;  Surgeon: Kathryne Hitch;  Location: [facility] ORS;  Service: Orthopedics;  Laterality: Right;  Removal of Cannulated Screws Right Hip, Right Direct Anterior Hip Replacement   TOTAL HIP ARTHROPLASTY Left 10/09/2018   Procedure: TOTAL HIP ARTHROPLASTY ANTERIOR APPROACH;  Surgeon: Samson Frederic, MD;  Location: [facility] OR;  Service: Orthopedics;  Laterality: Left;     A IV Location/Drains/Wounds Patient Lines/Drains/Airways Status     Active Line/Drains/Airways     Name Placement date Placement time Site Days   Peripheral IV 11/04/22 20 G Anterior;Distal;Left Forearm 11/04/22  1610  Forearm  less than 1   Wound / Incision (Open or Dehisced) 02/16/22 Puncture Groin Right 02/16/22  2102  Groin  261             Intake/Output Last 24 hours No intake or output data in the 24 hours ending 11/04/22 2053  Labs/Imaging Results for orders placed or performed during the hospital encounter of 11/04/22 (from the past 48 hour(s))  Basic metabolic panel     Status: Abnormal   Collection Time: 11/04/22  2:53 PM  Result Value Ref Range   Sodium 139 135 - 145 mmol/L   Potassium 3.8 3.5 - 5.1 mmol/L   Chloride 100 98 - 111 mmol/L   CO2 28 22 - 32 mmol/L   Glucose, Bld 135 (H) 70 - 99 mg/dL    Comment: Glucose reference range applies only to samples taken after fasting for at least 8 hours.   BUN 48 (H) 8 - 23 mg/dL   Creatinine, Ser 1.61 (H) 0.44 - 1.00 mg/dL   Calcium 9.2 8.9 - 09.6 mg/dL   GFR, Estimated 31 (L) >60 mL/min    Comment: (NOTE) Calculated using the CKD-EPI Creatinine Equation (2021)    Anion gap 11 5 - 15    Comment: Performed at [facility], [address] [place], [place] [address]  CBC     Status: Abnormal   Collection Time: 11/04/22  2:53 PM  Result Value Ref Range   WBC 3.7 (L) 4.0 - 10.5 K/uL   RBC 2.45 (L) 3.87 - 5.11 MIL/uL   Hemoglobin 6.5 (LL) 12.0 - 15.0 g/dL    Comment: REPEATED TO VERIFY THIS CRITICAL RESULT HAS VERIFIED AND BEEN CALLED TO B Clendenin RN BY BONNIE DAVIS ON 07 01 2024 AT 1536, AND HAS BEEN READ BACK.     HCT 21.9 (L) 36.0 - 46.0 %   MCV 89.4 80.0 - 100.0 fL   MCH 26.5 26.0 - 34.0 pg   MCHC 29.7 (L) 30.0 - 36.0 g/dL   RDW 98.1 (H) 19.1 - 47.8 %   Platelets 187 150 - 400 K/uL   nRBC 0.0 0.0 - 0.2 %    Comment: Performed at [facility], [address], [place], [place] [address]  Type and screen [facility]     Status: None   Collection Time: 11/04/22  4:36 PM  Result Value Ref Range   ABO/RH(D) A NEG    Antibody Screen POS  Sample Expiration 11/07/2022,2359    Antibody Identification ANTI FYA (Duffy a)    PT AG Type      NEGATIVE FOR KIDD B ANTIGEN Performed at [facility], [address],  [place], [place] [address]   Prepare RBC (crossmatch)     Status: None   Collection Time: 11/04/22  8:08 PM  Result Value Ref Range   Order Confirmation      ORDER PROCESSED BY BLOOD BANK Performed at [facility], [address] [place], [place] [address]    *Note: Due to a large number of results and/or encounters for the requested time period, some results have not been displayed. A complete set of results can be found in Results Review.   CT Angio Abd/Pel W and/or Wo Contrast  Result Date: 11/04/2022 CLINICAL DATA:  Lower GI bleed. EXAM: CTA ABDOMEN AND PELVIS WITHOUT AND WITH CONTRAST TECHNIQUE: Multidetector CT imaging of the abdomen and pelvis was performed using the standard protocol during bolus administration of intravenous contrast. Multiplanar reconstructed images and MIPs were obtained and reviewed to evaluate the vascular anatomy. RADIATION DOSE REDUCTION: This exam was performed according to the departmental dose-optimization program which includes automated exposure control, adjustment of the mA and/or kV according to patient size and/or use of iterative reconstruction technique. CONTRAST:  55mL OMNIPAQUE IOHEXOL 350 MG/ML SOLN COMPARISON:  CT abdomen pelvis dated 02/16/2022 and PET CT dated 04/10/2022. FINDINGS: VASCULAR Aorta: Advanced atherosclerotic calcification of the abdominal aorta. No aneurysmal dilatation or dissection. No periaortic fluid collection. Celiac: Atherosclerotic calcification of the origin of the celiac artery. The celiac trunk and its major branches are patent. There is an accessory left hepatic artery from the left gastric artery. SMA: The SMA is patent. Renals: Atherosclerotic calcification of the origin of the left renal artery. The left renal artery is patent. IMA: The IMA is patent. Inflow: Bilateral common iliac artery stents appear patent. There is moderate atherosclerotic calcification of the iliac arteries. No aneurysmal dilatation or dissection. The  iliac arteries remain patent. Proximal Outflow: The visualized proximal outflow is patent. Veins: The IVC is unremarkable.  No portal venous gas. Review of the MIP images confirms the above findings. NON-VASCULAR Lower chest: Partially visualized small bilateral pleural effusions, right greater than left. Cardiac AICD device noted. No intra-abdominal free air or free fluid. Hepatobiliary: There is irregularity of the liver contour consistent with changes of cirrhosis, possibly related to passive congestion and cardiac dysfunction. A 1 cm enhancing focus in the posteroinferior right lobe of the liver (46/6) is not characterized, but may represent a flash filling hemangioma or portal venous shunting. Other etiologies are not excluded. This is similar to the prior CT. Indeterminate 13 mm lesion in the left lobe of the liver demonstrates similar enhancement to the background liver on arterial phase and early washout. Further characterization with MRI without and with contrast recommended. There is mild biliary dilatation, post cholecystectomy. Pancreas: A 4.5 x 4.6 cm enhancing mass in the uncinate process of the pancreas slightly increased since the prior CT. Additional enhancing lesions in the distal body/tail of the pancreas measure up to 11 mm. No dilatation of the main pancreatic duct or gland atrophy. Spleen: Normal in size without focal abnormality. Adrenals/Urinary Tract: Status post prior right nephrectomy. The right adrenal gland is not visualized and may be surgically absent. There is no hydronephrosis or nephrolithiasis on the left. The left ureter is unremarkable. The urinary bladder is minimally distended and suboptimally visualized due to streak  artifact caused by bilateral total hip arthroplasties. Stomach/Bowel: There is no bowel obstruction or active inflammation. Moderate stool throughout the colon. Scattered colonic diverticula without active inflammatory changes. No evidence of active GI bleed. The  appendix is not visualized with certainty. No inflammatory changes identified in the right lower quadrant. Lymphatic: No adenopathy. Reproductive: The uterus is not visualized. Other: None Musculoskeletal: Osteopenia with degenerative changes of the spine. Bilateral total hip arthroplasties. Old L2 compression fracture with anterior wedging. No acute osseous pathology. IMPRESSION: 1. No evidence of active GI bleed. 2. Colonic diverticulosis. No bowel obstruction. 3. Cirrhosis with indeterminate liver lesions. Further characterization with MRI without and with contrast recommended. 4. Enhancing pancreatic masses, slightly increased since the prior CT. 5.  Aortic Atherosclerosis (ICD10-I70.0). Electronically Signed   By: Elgie Collard M.D.   On: 11/04/2022 20:17    Pending Labs Unresulted Labs (From admission, onward)     Start     Ordered   11/04/22 2044  Prepare RBC (crossmatch)  (Blood Administration Adult)  Once,   R       Question Answer Comment  # of Units 1 unit   Transfusion Indications Hemoglobin < 7 gm/dL and symptomatic   Number of Units to Keep Ahead NO units ahead   If emergent release call blood bank Not emergent release      11/04/22 2044            Vitals/Pain Today's Vitals   11/04/22 1800 11/04/22 1830 11/04/22 1934 11/04/22 1945  BP: 137/63 (!) 120/37 (!) 124/59 (!) 102/90  Pulse: 61 61 64 62  Resp: 15 (!) 22 20 (!) 22  Temp:   98.5 F (36.9 C)   TempSrc:   Oral   SpO2: 100% 100% 100% 100%  PainSc:        Isolation Precautions No active isolations  Medications Medications  acetaminophen (TYLENOL) tablet 650 mg (0 mg Oral Hold 11/04/22 2017)  diphenhydrAMINE (BENADRYL) capsule 50 mg (0 mg Oral Hold 11/04/22 2017)  methylPREDNISolone sodium succinate (SOLU-MEDROL) 40 mg/mL injection 40 mg (0 mg Intravenous Hold 11/04/22 2018)  0.9 %  sodium chloride infusion (Manually program via Guardrails IV Fluids) (0 mLs Intravenous Hold 11/04/22 2014)  pantoprazole (PROTONIX)  injection 40 mg (has no administration in time range)  iohexol (OMNIPAQUE) 350 MG/ML injection 55 mL (55 mLs Intravenous Contrast Given 11/04/22 1912)  lactated ringers bolus 500 mL (500 mLs Intravenous New Bag/Given 11/04/22 2053)    Mobility walks     Focused Assessments Neuro Assessment Handoff:  Swallow screen pass? Yes          Neuro Assessment:   Neuro Checks:      Has TPA been given? No If patient is a Neuro Trauma and patient is going to OR before floor call report to 4N Charge nurse: [phone] or [phone]   R Recommendations: See Admitting Provider Note  Report given to:   Additional Notes: Pt's blood delayed due to transport from [place].

## 2022-11-04 NOTE — H&P (Incomplete)
History and Physical  [name]:[id number] DOB: 1933/08/29 DOA: 11/04/2022  Referring physician: Dr. Willeen Cass, Resident-EDP  PCP: [name], MD  Outpatient Specialists: Cardiology, GI, hematology oncology. Patient coming from: Home.  Chief Complaint: Generalized fatigue, dark stools.  HPI: [name] is a 89 y.o. female with medical history significant for HCOM, nonischemic cardiomyopathy, permanent atrial fibrillation on Eliquis, chronic hypoxia on 2 L nasal cannula continuously, carotid artery stenosis, peripheral artery disease, hypertension, hyperlipidemia, chronic HFpEF, hypothyroidism, CKD 3B, recent herpes zoster infection, history of blood transfusion reaction, renal cell carcinoma status post nephrectomy in 2003, history of angiodysplasia of the stomach and duodenum/gastritis on daily PPI, who presented with generalized fatigue.  Associated with exertional shortness of breath.  On Thursday, 5 days ago the patient was seen by her primary care provider.  Blood work was obtained and revealed acute blood loss anemia with hemoglobin of 8K from 10K at baseline.  The patient was told to go to the ED.    Endorses melanic stools x 2 weeks.  Intermittent nausea with no vomiting.  No significant abdominal pain.  Also endorses lightheadedness upon standing.  She is compliant with her home Eliquis, last dose was taken this morning.  She decided to come to the ED for further evaluation.  In the ED, she is in the room accompanied by her granddaughter.  She is alert and weak-appearing.  Hemoglobin in the ED, noted to have dropped 6.8K from 8K at her last PCP's visit.  2 units PRBCs were ordered to be transfused.  The patient has a history of blood transfusion reaction.  The blood will be delivered from [place], [place].  The patient will be premedicated prior to receiving the blood.    Due to concern for upper GI bleed, IV Protonix 40 mg twice daily was initiated and GI was  consulted to assist with the management.  CT angio abdomen and pelvis with/without contrast revealed the following findings:  1. No evidence of active GI bleed. 2. Colonic diverticulosis. No bowel obstruction. 3. Cirrhosis with indeterminate liver lesions. Further characterization with MRI without and with contrast recommended. 4. Enhancing pancreatic masses, slightly increased since the prior CT. 5.  Aortic Atherosclerosis (ICD10-I70.0).  The patient was admitted by [facility], hospitalist service, to progressive care unit as inpatient status for acute blood loss and symptomatic anemia.  ED Course: Temperature 98.6.  BP 116/55, pulse 62, respiratory 22, saturation 98% on 3 L.  Review of Systems: Review of systems as noted in the HPI. All other systems reviewed and are negative.   Past Medical History:  Diagnosis Date   Actinic keratosis 01/17/2015   R forearm   Adjustment disorder with anxiety    Anemia    Anginal pain (HCC)    Arthritis    "some in my hands" (11/11/2012)   Atrial fibrillation (HCC)    Atypical mole 03/25/2018   L forearm - severe   Automatic implantable cardioverter-defibrillator in situ    Avascular necrosis of hip (HCC) 05/03/2011   Blood in stool    Carotid artery stenosis 09/2007   a. 09/2007: 60-79% bilateral (stable); b. 10/2008: 40-59% R 60-79%    Cellulitis of left lower extremity 07/13/2020   Chest pain, unspecified    Chronic airway obstruction, not elsewhere classified    Chronic bronchitis (HCC)    "get it some; not q year" (11/11/2012)   Chronic diastolic CHF (congestive heart failure) (HCC) 06/04/2013   Chronic kidney disease, unspecified    Coronary  artery disease    non-obstructive by 2006 cath   COVID-19 virus infection 02/12/2022   Displaced fracture of left femoral neck (HCC) 10/09/2018   Frequent UTI    "get them a couple times/yr" (11/11/2012)   GI bleed 03/28/2020   Hemorrhage of rectum and anus    High cholesterol    History of blood  transfusion 04/2011   "after hip OR" (11/11/2012)   Hx of renal cell cancer    Hypertension 05/20/2011   Hypertr obst cardiomyop    Hypotension, unspecified    cardiac cath 2006..nonobstructive CAD 30-40s lesions.Marland KitchenETT 1/09 nondiagnostic due to poor HR response..Right Renal Cancer 2003   Long term (current) use of anticoagulants    Malignant neoplasm of kidney, except pelvis    Osteoarthritis of right hip    Other and unspecified coagulation defects    PONV (postoperative nausea and vomiting)    Presence of permanent cardiac pacemaker    RECTAL BLEEDING 10/13/2009   Qualifier: Diagnosis of  By: Wilmon Pali NP, Paula     Renal cancer (HCC) 06/2001   Right   Secondary cardiomyopathy, unspecified    Sinus bradycardia    Squamous cell carcinoma of skin 01/17/2015   R lat wrist   Squamous cell carcinoma of skin 01/29/2018   R post upper leg - superficially invasive   Squamous cell carcinoma of skin 03/25/2018   L lat foot   Squamous cell carcinoma of skin 11/09/2020   left lat foot - EDC 01/01/21, recurrent 01/31/21 - MOHs 02/22/21   Squamous cell carcinoma of skin 12/19/2021   Right Posterior Medial Thigh, EDC   Squamous cell carcinoma of skin 12/19/2021   SCC IS, L lat heel, EDC 02/04/2022   Squamous cell carcinoma of skin 01/21/2022   L forearm, EDC 02/04/2022   Squamous cell carcinoma of skin 01/21/2022   R lower leg below knee, EDC 02/04/2022   Squamous cell carcinoma of skin 01/21/2022   SCCIS, R post heel, EDC 02/04/2022   Squamous cell carcinoma of skin 07/01/2022   left lower abdomen, in situ, EDC   Squamous cell carcinoma of skin 07/01/2022   left medial chest, EDC   Urge incontinence    Past Surgical History:  Procedure Laterality Date   ABDOMINAL AORTOGRAM W/LOWER EXTREMITY N/A 02/12/2021   Procedure: ABDOMINAL AORTOGRAM W/LOWER EXTREMITY;  Surgeon: Maeola Harman, MD;  Location[facility] INVASIVE CV LAB;  Service: Cardiovascular;  Laterality: N/A;   ABDOMINAL  HYSTERECTOMY  1975   for benign causes   APPENDECTOMY     BI-VENTRICULAR PACEMAKER UPGRADE  05/04/2010   BIOPSY  02/28/2019   Procedure: BIOPSY;  Surgeon: Rachael Fee, MD;  Location: [facility] ENDOSCOPY;  Service: Endoscopy;;   BIOPSY  03/04/2022   Procedure: BIOPSY;  Surgeon: Lemar Lofty., MD;  Location: [facility] ENDOSCOPY;  Service: Gastroenterology;;   CARDIAC CATHETERIZATION  2006   CARDIOVERSION N/A 02/20/2018   Procedure: CARDIOVERSION;  Surgeon: Antonieta Iba, MD;  Location: [facility] ORS;  Service: Cardiovascular;  Laterality: N/A;   CARDIOVERSION N/A 03/27/2018   Procedure: CARDIOVERSION;  Surgeon: Antonieta Iba, MD;  Location: [facility] ORS;  Service: Cardiovascular;  Laterality: N/A;   CATARACT EXTRACTION W/ INTRAOCULAR LENS  IMPLANT, BILATERAL  01/2006-02-2006   CHOLECYSTECTOMY N/A 11/11/2012   Procedure: LAPAROSCOPIC CHOLECYSTECTOMY WITH INTRAOPERATIVE CHOLANGIOGRAM;  Surgeon: Wilmon Arms. Corliss Skains, MD;  Location: [facility] OR;  Service: General;  Laterality: N/A;   COLONOSCOPY WITH PROPOFOL N/A 02/28/2019   Procedure: COLONOSCOPY WITH PROPOFOL;  Surgeon: Rachael Fee,  MD;  Location: [facility] ENDOSCOPY;  Service: Endoscopy;  Laterality: N/A;   ENTEROSCOPY N/A 03/30/2020   Procedure: ENTEROSCOPY;  Surgeon: Shellia Cleverly, DO;  Location: [facility] ENDOSCOPY;  Service: Gastroenterology;  Laterality: N/A;   ENTEROSCOPY N/A 02/10/2021   Procedure: ENTEROSCOPY;  Surgeon: Jeani Hawking, MD;  Location: [facility] ENDOSCOPY;  Service: Endoscopy;  Laterality: N/A;   ENTEROSCOPY N/A 02/15/2022   Procedure: ENTEROSCOPY;  Surgeon: Imogene Burn, MD;  Location: [facility] ENDOSCOPY;  Service: Gastroenterology;  Laterality: N/A;   EP IMPLANTABLE DEVICE N/A 02/21/2016   Procedure: ICD Generator Changeout;  Surgeon: Duke Salvia, MD;  Location: [facility] INVASIVE CV LAB;  Service: Cardiovascular;  Laterality: N/A;   ESOPHAGOGASTRODUODENOSCOPY N/A 03/04/2022   Procedure: ESOPHAGOGASTRODUODENOSCOPY (EGD);  Surgeon: Lemar Lofty., MD;  Location: [facility] ENDOSCOPY;  Service: Gastroenterology;  Laterality: N/A;   ESOPHAGOGASTRODUODENOSCOPY (EGD) WITH PROPOFOL N/A 02/28/2019   Procedure: ESOPHAGOGASTRODUODENOSCOPY (EGD) WITH PROPOFOL;  Surgeon: Rachael Fee, MD;  Location: [facility] ENDOSCOPY;  Service: Endoscopy;  Laterality: N/A;   EUS N/A 03/04/2022   Procedure: UPPER ENDOSCOPIC ULTRASOUND (EUS) RADIAL;  Surgeon: Lemar Lofty., MD;  Location: [facility] ENDOSCOPY;  Service: Gastroenterology;  Laterality: N/A;   FINE NEEDLE ASPIRATION N/A 03/04/2022   Procedure: FINE NEEDLE ASPIRATION (FNA) LINEAR;  Surgeon: Lemar Lofty., MD;  Location: [facility] ENDOSCOPY;  Service: Gastroenterology;  Laterality: N/A;   GIVENS CAPSULE STUDY N/A 03/15/2019   Procedure: GIVENS CAPSULE STUDY;  Surgeon: Tressia Danas, MD;  Location: [facility] ENDOSCOPY;  Service: Gastroenterology;  Laterality: N/A;   HOT HEMOSTASIS N/A 03/30/2020   Procedure: HOT HEMOSTASIS (ARGON PLASMA COAGULATION/BICAP);  Surgeon: Shellia Cleverly, DO;  Location: [facility] ENDOSCOPY;  Service: Gastroenterology;  Laterality: N/A;   HOT HEMOSTASIS N/A 02/10/2021   Procedure: HOT HEMOSTASIS (ARGON PLASMA COAGULATION/BICAP);  Surgeon: Jeani Hawking, MD;  Location: [facility] ENDOSCOPY;  Service: Endoscopy;  Laterality: N/A;   HOT HEMOSTASIS N/A 02/15/2022   Procedure: HOT HEMOSTASIS (ARGON PLASMA COAGULATION/BICAP);  Surgeon: Imogene Burn, MD;  Location: [facility] ENDOSCOPY;  Service: Gastroenterology;  Laterality: N/A;   INSERT / REPLACE / REMOVE PACEMAKER  05-01-11   02-28-05-/05-04-10-ICD-MEDTRONIC MAXIMAL DR   IR ANGIOGRAM SELECTIVE EACH ADDITIONAL VESSEL  02/18/2022   IR ANGIOGRAM SELECTIVE EACH ADDITIONAL VESSEL  02/18/2022   IR ANGIOGRAM VISCERAL SELECTIVE  02/16/2022   IR ANGIOGRAM VISCERAL SELECTIVE  02/16/2022   IR EMBO ART  VEN HEMORR LYMPH EXTRAV  INC GUIDE ROADMAPPING  02/16/2022   IR THORACENTESIS ASP PLEURAL SPACE W/IMG GUIDE  02/21/2022   IR THORACENTESIS ASP PLEURAL SPACE W/IMG  GUIDE  02/22/2022   IR US GUIDE VASC ACCESS RIGHT  02/16/2022   JOINT REPLACEMENT     LAPAROSCOPIC CHOLECYSTECTOMY  11/11/2012   LAPAROSCOPIC LYSIS OF ADHESIONS N/A 11/11/2012   Procedure: LAPAROSCOPIC LYSIS OF ADHESIONS;  Surgeon: Wilmon Arms. Corliss Skains, MD;  Location: [facility] OR;  Service: General;  Laterality: N/A;   NEPHRECTOMY Right 06/2001    S/P RENAL CELL CANCER   PERIPHERAL VASCULAR INTERVENTION Bilateral 02/12/2021   Procedure: PERIPHERAL VASCULAR INTERVENTION;  Surgeon: Maeola Harman, MD;  Location: [facility] INVASIVE CV LAB;  Service: Cardiovascular;  Laterality: Bilateral;  Iliac artery stents   PRESSURE SENSOR/CARDIOMEMS N/A 02/03/2019   Procedure: PRESSURE SENSOR/CARDIOMEMS;  Surgeon: Laurey Morale, MD;  Location: [facility] INVASIVE CV LAB;  Service: Cardiovascular;  Laterality: N/A;   RIGHT HEART CATH N/A 11/09/2018   Procedure: RIGHT HEART CATH;  Surgeon: Laurey Morale, MD;  Location: [facility] INVASIVE CV LAB;  Service: Cardiovascular;  Laterality:  N/A;   RIGHT HEART CATH N/A 03/08/2019   Procedure: RIGHT HEART CATH;  Surgeon: Laurey Morale, MD;  Location: [facility] INVASIVE CV LAB;  Service: Cardiovascular;  Laterality: N/A;   SUBMUCOSAL TATTOO INJECTION  02/28/2019   Procedure: SUBMUCOSAL TATTOO INJECTION;  Surgeon: Rachael Fee, MD;  Location: [facility] ENDOSCOPY;  Service: Endoscopy;;   TOTAL HIP ARTHROPLASTY Right 05/03/2011   Procedure: TOTAL HIP ARTHROPLASTY ANTERIOR APPROACH;  Surgeon: Kathryne Hitch;  Location: [facility] ORS;  Service: Orthopedics;  Laterality: Right;  Removal of Cannulated Screws Right Hip, Right Direct Anterior Hip Replacement   TOTAL HIP ARTHROPLASTY Left 10/09/2018   Procedure: TOTAL HIP ARTHROPLASTY ANTERIOR APPROACH;  Surgeon: Samson Frederic, MD;  Location: [facility] OR;  Service: Orthopedics;  Laterality: Left;    Social History:  reports that she quit smoking about 21 years ago. Her smoking use included cigarettes. She has a 20.00 pack-year smoking history. She has never used  smokeless tobacco. She reports that she does not drink alcohol and does not use drugs.   Allergies  Allergen Reactions   Dilaudid [Hydromorphone] Other (See Comments)    Excessive Somnolence- Required Narcan   Morphine And Codeine Nausea And Vomiting    Hallucinations    Oxycodone Nausea Only and Other (See Comments)    Nausea and lethargy   Tramadol Nausea Only and Other (See Comments)    Nausea and lethargy   Whole Blood Itching and Nausea And Vomiting    Pt and family stated pt will receive blood products but had a reaction requiring benadryl on a previous administration.  Needs premedication with tylenol, benadryl, steroids for future transfusions     Family History  Problem Relation Age of Onset   Heart failure Mother    Early death Father        car accident   Breast cancer Maternal Aunt 26   Breast cancer Cousin    Breast cancer Other    Colon cancer Neg Hx    Esophageal cancer Neg Hx    Liver disease Neg Hx    Pancreatic cancer Neg Hx    Rectal cancer Neg Hx        Prior to Admission medications   Medication Sig Start Date End Date Taking? Authorizing Provider  acetaminophen (TYLENOL) 500 MG tablet Take 1,000 mg by mouth every 8 (eight) hours as needed for mild pain or headache.   Yes [provider]  Biotin 1000 MCG CHEW Chew 2 1e11 Vector Genomes by mouth daily.   Yes [provider]  Cyanocobalamin (B-12 PO) Take 1 tablet by mouth daily.   Yes [provider]  ELIQUIS 2.5 MG TABS tablet TAKE ONE TABLET BY MOUTH TWICE A DAY 07/25/22  Yes Laurey Morale, MD  fluticasone [facility]) 50 MCG/ACT nasal spray Place 2 sprays into both nostrils daily as needed for allergies or rhinitis. 07/04/22  Yes [name], MD  potassium chloride SA (KLOR-CON M) 20 MEQ tablet Take 2 tablets (40 mEq total) by mouth daily. Patient taking differently: Take 20 mEq by mouth 2 (two) times daily. 08/21/22  Yes Milford, Anderson Malta, FNP  rosuvastatin (CRESTOR)  40 MG tablet Take 1 tablet (40 mg total) by mouth daily. Patient taking differently: Take 40 mg by mouth every evening. 05/02/22  Yes [name], MD  torsemide (DEMADEX) 20 MG tablet Take 4 tablets (80 mg total) by mouth 2 (two) times daily. 08/21/22  Yes Milford, Anderson Malta, FNP  vitamin C (ASCORBIC ACID) 250 MG tablet  Take 500 mg by mouth daily.   Yes [provider]  VITAMIN D PO Take 4,000 Units by mouth daily.   Yes [provider]  dapagliflozin propanediol (FARXIGA) 10 MG TABS tablet Take 1 tablet (10 mg total) by mouth daily before breakfast. Patient not taking: Reported on 11/04/2022 06/10/22   Laurey Morale, MD  gabapentin (NEURONTIN) 100 MG capsule Take 1 capsule (100 mg total) by mouth at bedtime for 3 days, THEN 1 capsule (100 mg total) 2 (two) times daily for 3 days, THEN 1 capsule (100 mg total) 3 (three) times daily for 3 days. Patient not taking: Reported on 09/16/2022 09/11/22 09/20/22  [name], MD  ondansetron (ZOFRAN) 4 MG tablet Take 1 tablet (4 mg total) by mouth every 8 (eight) hours as needed for nausea or vomiting. Patient not taking: Reported on 11/04/2022 09/20/22   [name], MD  pantoprazole (PROTONIX) 40 MG tablet Take 1 tablet (40 mg total) by mouth daily. Patient not taking: Reported on 11/04/2022 05/28/22   [name], MD    Physical Exam: BP (!) 135/48   Pulse 96   Temp 98.5 F (36.9 C) (Oral)   Resp 20   SpO2 90%   General: 89 y.o. year-old female well developed well nourished in no acute distress.  Alert and oriented x3. Cardiovascular: Regular rate and rhythm with no rubs or gallops.  No thyromegaly or JVD noted.  Trace lower extremity edema bilaterally Respiratory: Clear to auscultation with no wheezes or rales. Good inspiratory effort. Abdomen: Soft mild diffuse tenderness nondistended with normal bowel sounds x4 quadrants. Muskuloskeletal: No cyanosis or clubbing noted bilaterally Neuro: CN II-XII intact,  strength, sensation, reflexes Skin: No ulcerative lesions noted or rashes Psychiatry: Judgement and insight appear normal. Mood is appropriate for condition and setting          Labs on Admission:  Basic Metabolic Panel: Recent Labs  Lab 10/31/22 1204 11/04/22 1453  NA 139 139  K 5.0 3.8  CL 100 100  CO2 31 28  GLUCOSE 87 135*  BUN 42* 48*  CREATININE 1.57* 1.62*  CALCIUM 9.6 9.2   Liver Function Tests: Recent Labs  Lab 10/31/22 1204  AST 15  ALT 7  ALKPHOS 51  BILITOT 0.5  PROT 6.7  ALBUMIN 4.2   No results for input(s): "LIPASE", "AMYLASE" in the last 168 hours. No results for input(s): "AMMONIA" in the last 168 hours. CBC: Recent Labs  Lab 10/31/22 1204 11/04/22 1453  WBC 4.1 3.7*  NEUTROABS 3.2  --   HGB 8.8 Repeated and verified X2.* 6.5*  HCT 27.7* 21.9*  MCV 84.3 89.4  PLT 203.0 187   Cardiac Enzymes: No results for input(s): "CKTOTAL", "CKMB", "CKMBINDEX", "TROPONINI" in the last 168 hours.  BNP (last 3 results) Recent Labs    06/10/22 1553  BNP 208.4*    ProBNP (last 3 results) No results for input(s): "PROBNP" in the last 8760 hours.  CBG: No results for input(s): "GLUCAP" in the last 168 hours.  Radiological Exams on Admission: CT Angio Abd/Pel W and/or Wo Contrast  Result Date: 11/04/2022 CLINICAL DATA:  Lower GI bleed. EXAM: CTA ABDOMEN AND PELVIS WITHOUT AND WITH CONTRAST TECHNIQUE: Multidetector CT imaging of the abdomen and pelvis was performed using the standard protocol during bolus administration of intravenous contrast. Multiplanar reconstructed images and MIPs were obtained and reviewed to evaluate the vascular anatomy. RADIATION DOSE REDUCTION: This exam was performed according to the departmental dose-optimization program which includes automated exposure  control, adjustment of the mA and/or kV according to patient size and/or use of iterative reconstruction technique. CONTRAST:  55mL OMNIPAQUE IOHEXOL 350 MG/ML SOLN COMPARISON:   CT abdomen pelvis dated 02/16/2022 and PET CT dated 04/10/2022. FINDINGS: VASCULAR Aorta: Advanced atherosclerotic calcification of the abdominal aorta. No aneurysmal dilatation or dissection. No periaortic fluid collection. Celiac: Atherosclerotic calcification of the origin of the celiac artery. The celiac trunk and its major branches are patent. There is an accessory left hepatic artery from the left gastric artery. SMA: The SMA is patent. Renals: Atherosclerotic calcification of the origin of the left renal artery. The left renal artery is patent. IMA: The IMA is patent. Inflow: Bilateral common iliac artery stents appear patent. There is moderate atherosclerotic calcification of the iliac arteries. No aneurysmal dilatation or dissection. The iliac arteries remain patent. Proximal Outflow: The visualized proximal outflow is patent. Veins: The IVC is unremarkable.  No portal venous gas. Review of the MIP images confirms the above findings. NON-VASCULAR Lower chest: Partially visualized small bilateral pleural effusions, right greater than left. Cardiac AICD device noted. No intra-abdominal free air or free fluid. Hepatobiliary: There is irregularity of the liver contour consistent with changes of cirrhosis, possibly related to passive congestion and cardiac dysfunction. A 1 cm enhancing focus in the posteroinferior right lobe of the liver (46/6) is not characterized, but may represent a flash filling hemangioma or portal venous shunting. Other etiologies are not excluded. This is similar to the prior CT. Indeterminate 13 mm lesion in the left lobe of the liver demonstrates similar enhancement to the background liver on arterial phase and early washout. Further characterization with MRI without and with contrast recommended. There is mild biliary dilatation, post cholecystectomy. Pancreas: A 4.5 x 4.6 cm enhancing mass in the uncinate process of the pancreas slightly increased since the prior CT. Additional  enhancing lesions in the distal body/tail of the pancreas measure up to 11 mm. No dilatation of the main pancreatic duct or gland atrophy. Spleen: Normal in size without focal abnormality. Adrenals/Urinary Tract: Status post prior right nephrectomy. The right adrenal gland is not visualized and may be surgically absent. There is no hydronephrosis or nephrolithiasis on the left. The left ureter is unremarkable. The urinary bladder is minimally distended and suboptimally visualized due to streak artifact caused by bilateral total hip arthroplasties. Stomach/Bowel: There is no bowel obstruction or active inflammation. Moderate stool throughout the colon. Scattered colonic diverticula without active inflammatory changes. No evidence of active GI bleed. The appendix is not visualized with certainty. No inflammatory changes identified in the right lower quadrant. Lymphatic: No adenopathy. Reproductive: The uterus is not visualized. Other: None Musculoskeletal: Osteopenia with degenerative changes of the spine. Bilateral total hip arthroplasties. Old L2 compression fracture with anterior wedging. No acute osseous pathology. IMPRESSION: 1. No evidence of active GI bleed. 2. Colonic diverticulosis. No bowel obstruction. 3. Cirrhosis with indeterminate liver lesions. Further characterization with MRI without and with contrast recommended. 4. Enhancing pancreatic masses, slightly increased since the prior CT. 5.  Aortic Atherosclerosis (ICD10-I70.0). Electronically Signed   By: Elgie Collard M.D.   On: 11/04/2022 20:17    EKG: I independently viewed the EKG done and my findings are as followed: Atrial fibrillation rate of 68.  Nonspecific ST-T changes.  QTc 467.  Assessment/Plan Present on Admission:  Symptomatic anemia  Active Problems:   Symptomatic anemia  Acute blood loss anemia Symptomatic anemia Presented with melena x 2 weeks, exertional dyspnea, generalized fatigue, drop in hemoglobin Hemoglobin 6.8  on  presentation 2 units PRBCs ordered to be transfused, PRBCs will come from [facility] History of transfusion reaction Premedicate prior to blood transfusion Obtain CBC post blood transfusion GI consulted to assist due to concern for possible upper GI bleed  History of angiodysplasia of the stomach and duodenum History of gastritis IV Protonix 40 mg twice daily  History of pancreatic cancer Seen by Dr. Mosetta Putt outpatient, added to treatment team. Per the patient, she missed 3 scheduled therapies due to herpes zoster infection, it has now resolved See CT scanning report regarding pancreatic lesions.  Permanent atrial fibrillation on Eliquis Currently rate controlled. Eliquis on hold due to suspected upper GI bleed Restart Eliquis when okay with GI. Continue GI prophylaxis while on DOAC Monitor on telemetry  CKD 3B Appears to be close to her baseline Continue to avoid nephrotoxic agents and hypotension.  Mild leukopenia Monitor and repeat CBC  Generalized weakness, suspect contributed by severe anemia PT OT assessment Fall precautions    Time: 75 minutes.    DVT prophylaxis: SCDs.  Home Eliquis on hold due to suspected upper GI bleed.   Code Status: Full code  Family Communication: None at bedside.  Disposition Plan: Admitted to progressive care unit  Consults called: GI  Admission status: Inpatient status.   Status is: Inpatient The patient requires at least 2 midnights for further evaluation and treatment of present condition.   Darlin Drop MD [facility] Pager [phone]  If 7PM-7AM, please contact night-coverage [email or web address] Password [email or web address]  11/04/2022, 9:33 PM

## 2022-11-04 NOTE — Plan of Care (Signed)
  Problem: Education: Goal: Knowledge of General Education information will improve Description Including pain rating scale, medication(s)/side effects and non-pharmacologic comfort measures Outcome: Progressing   Problem: Health Behavior/Discharge Planning: Goal: Ability to manage health-related needs will improve Outcome: Progressing   Problem: Clinical Measurements: Goal: Ability to maintain clinical measurements within normal limits will improve Outcome: Progressing Goal: Will remain free from infection Outcome: Progressing Goal: Diagnostic test results will improve Outcome: Progressing Goal: Respiratory complications will improve Outcome: Progressing Goal: Cardiovascular complication will be avoided Outcome: Progressing   Problem: Activity: Goal: Risk for activity intolerance will decrease Outcome: Progressing   Problem: Nutrition: Goal: Adequate nutrition will be maintained Outcome: Progressing   Problem: Coping: Goal: Level of anxiety will decrease Outcome: Progressing   Problem: Elimination: Goal: Will not experience complications related to bowel motility Outcome: Progressing Goal: Will not experience complications related to urinary retention Outcome: Progressing   Problem: Pain Managment: Goal: General experience of comfort will improve Outcome: Progressing   Problem: Safety: Goal: Ability to remain free from injury will improve Outcome: Progressing   Problem: Skin Integrity: Goal: Risk for impaired skin integrity will decrease Outcome: Progressing   Problem: Education: Goal: Ability to demonstrate management of disease process will improve Outcome: Progressing Goal: Ability to verbalize understanding of medication therapies will improve Outcome: Progressing   Problem: Activity: Goal: Capacity to carry out activities will improve Outcome: Progressing   Problem: Cardiac: Goal: Ability to achieve and maintain adequate cardiopulmonary perfusion  will improve Outcome: Progressing   

## 2022-11-04 NOTE — ED Notes (Signed)
Spoke to nurse, Harriett Sine and gave oral report over the phone.

## 2022-11-05 ENCOUNTER — Encounter (HOSPITAL_COMMUNITY): Payer: Self-pay | Admitting: Internal Medicine

## 2022-11-05 DIAGNOSIS — D649 Anemia, unspecified: Secondary | ICD-10-CM | POA: Diagnosis not present

## 2022-11-05 DIAGNOSIS — K921 Melena: Secondary | ICD-10-CM | POA: Diagnosis not present

## 2022-11-05 DIAGNOSIS — N183 Chronic kidney disease, stage 3 unspecified: Secondary | ICD-10-CM | POA: Diagnosis not present

## 2022-11-05 DIAGNOSIS — D3A8 Other benign neuroendocrine tumors: Secondary | ICD-10-CM

## 2022-11-05 DIAGNOSIS — Z7901 Long term (current) use of anticoagulants: Secondary | ICD-10-CM

## 2022-11-05 DIAGNOSIS — D500 Iron deficiency anemia secondary to blood loss (chronic): Secondary | ICD-10-CM

## 2022-11-05 DIAGNOSIS — I4891 Unspecified atrial fibrillation: Secondary | ICD-10-CM

## 2022-11-05 LAB — TYPE AND SCREEN: Unit division: 0

## 2022-11-05 LAB — PREPARE RBC (CROSSMATCH)

## 2022-11-05 LAB — BPAM RBC

## 2022-11-05 NOTE — Plan of Care (Signed)
  Problem: Education: Goal: Knowledge of General Education information will improve Description Including pain rating scale, medication(s)/side effects and non-pharmacologic comfort measures Outcome: Progressing   Problem: Health Behavior/Discharge Planning: Goal: Ability to manage health-related needs will improve Outcome: Progressing   Problem: Clinical Measurements: Goal: Ability to maintain clinical measurements within normal limits will improve Outcome: Progressing Goal: Will remain free from infection Outcome: Progressing Goal: Diagnostic test results will improve Outcome: Progressing Goal: Respiratory complications will improve Outcome: Progressing Goal: Cardiovascular complication will be avoided Outcome: Progressing   Problem: Activity: Goal: Risk for activity intolerance will decrease Outcome: Progressing   Problem: Nutrition: Goal: Adequate nutrition will be maintained Outcome: Progressing   Problem: Coping: Goal: Level of anxiety will decrease Outcome: Progressing   Problem: Elimination: Goal: Will not experience complications related to bowel motility Outcome: Progressing Goal: Will not experience complications related to urinary retention Outcome: Progressing   Problem: Pain Managment: Goal: General experience of comfort will improve Outcome: Progressing   Problem: Safety: Goal: Ability to remain free from injury will improve Outcome: Progressing   Problem: Skin Integrity: Goal: Risk for impaired skin integrity will decrease Outcome: Progressing   Problem: Education: Goal: Ability to demonstrate management of disease process will improve Outcome: Progressing Goal: Ability to verbalize understanding of medication therapies will improve Outcome: Progressing   Problem: Activity: Goal: Capacity to carry out activities will improve Outcome: Progressing   Problem: Cardiac: Goal: Ability to achieve and maintain adequate cardiopulmonary perfusion  will improve Outcome: Progressing   

## 2022-11-05 NOTE — Progress Notes (Signed)
PT Cancellation Note  Patient Details Name: [name] MRN: [id number] DOB: 24-Apr-1934   Cancelled Treatment:    Reason Eval/Treat Not Completed: Medical issues which prohibited therapy; hemoglobin still 6.5 and pt symptomatic sitting up at EOB with hypotension per nursing.  Will attempt later after transfusion as schedule permits.    Elray Mcgregor 11/05/2022, 10:17 AM Sheran Lawless, PT Acute Rehabilitation Services Office:[phone] 11/05/2022

## 2022-11-05 NOTE — H&P (View-Only) (Signed)
Consultation Note   Referring Provider:  [facility] PCP: [name], MD Primary Gastroenterologist: Doristine Locks, MD        Reason for consultation: melena  DOA: 11/04/2022         Hospital Day: 2   Assessment and Plan   89 yo female with a history of small bowel AVMs admitted with melena and symptomatic acute on chronic anemia. Suspect bleeding is from small bowel AVMs in setting of Eliquis Hgb 6.5, down from baseline 9-10 -2 units of RBCs ordered, coming from [place] due to history of blood transfusion reaction  -Plan for small bowel endoscopy tomorrow ( if gets transfused). The risks and benefits of small bowel endoscopy  were discussed with the patient who agrees to proceed.  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 Surgery wasn't recommended in multifocal NET and given age and co-morbidities. Treated with sandostatin injections   A-Fib on Eliquis -Last dose was 7/1 am   CKD3  See PMH for additional medical problems   History of Present Illness Patient is a 89 y.o. year old female whose past medical history includes but is not necessarily limited to nonischemic cardiomyopathy, permanent A-Fib on Eliquis, chronic hypoxia on 2 L nasal cannula continuously, carotid artery stenosis, peripheral artery disease, hypertension, hyperlipidemia, chronic HFpEF, hypothyroidism, CKD 3, RCC s/p nephrectomy, Small bowel AVMs, ischemic colitis, diverticulosis, pancreatic neuroendocrine tumors  Escarlet was admitted yesterday with melena and fatigue. Someone stopped her iron several months ago so stool not typically black. Noticed black stools starting a couple of weeks ago. No abdominal pain to speak of. She doesn't take NSAIDS. No reflux symptoms. No on a PPI. She is on Eliquis.   Baseline hg ~ 10 >>  8.8 >> 6.5 today.  Ferritin is 32, probably iron deficient BUN 48, Cr 1.62  CTA abd / pelvis negative for GI bleed, + cirrhosis with indeterminate liver lesions and enhancing pancreatic masses, slightly increased.   Labs and Imaging: Recent Labs    11/04/22 1453  WBC 3.7*  HGB 6.5*  HCT 21.9*  PLT 187   Recent Labs    11/04/22 1453  NA 139  K 3.8  CL 100  CO2 28  GLUCOSE 135*  BUN 48*  CREATININE 1.62*  CALCIUM 9.2   No results for input(s): "PROT", "ALBUMIN", "AST", "ALT", "ALKPHOS", "BILITOT", "BILIDIR", "IBILI" in the last 72 hours. No results for input(s): "HEPBSAG", "HCVAB", "HEPAIGM", "HEPBIGM" in the last 72 hours. No results for input(s): "LABPROT", "INR" in the last 72 hours.  Previous GI Evaluation:   **Most recent studies. Not all inclusive**  Oct 2020 colonoscopy for iron deficiency anemia Good prep.  -Diverticulosis throughout colon  - Patchy, moderate inflammation in the sigmoid colon. I have most concern for ischemic, possibly chronic. Two sites were biopsied. These findings could possibly explain acute on chronic, heme + anemia. - I have concerns about what appear to be ischemic changes in her left colon causing bleeding, anemia. I recommend up to date cardilogy consultation as she has known CHF, Afib and also consider messenteric vascular workup. -   Nov 2020 Capsule  study   Oct 2022 Small bowel endoscopy for melena - Normal esophagus. - A few non-bleeding angiodysplastic lesions in the stomach. Treated with a monopolar probe. - Four bleeding angiodysplastic lesions in the duodenum. Treated with a monopolar probe. - A single non-bleeding angiodysplastic lesion in the jejunum. Treated with a monopolar probe. - No specimens collected.  Oct 2023 small bowel endoscopy for melena - Normal esophagus. - Hiatal hernia. - Gastritis. - Two non-bleeding angioectasias in the duodenum. Treated with APC - The examined portion of the jejunum was normal. - No  specimens collected. -   Oct 2023 EGD and EUS EGD impression: - No gross lesions in the entire esophagus. Z-line irregular, 38 cm from the incisors. - 3 cm hiatal hernia. - Erosive gastropathy with no bleeding and no stigmata of recent bleeding. No other gross lesions in the entire stomach. Biopsied. - Duodenal erosion without bleeding -likely site of prior APC. No other gross lesions in the duodenal bulb, in the first portion of the duodenum and in the second portion of the duodenum. - Normal major papilla pointed superiorly.   EUS impression: - A mass was identified in the pancreatic head/uncinate process. Cytology results are pending. However, the endosonographic appearance is suggestive of a neuroendocrine tumor. This was staged T3 N0 Mx by endosonographic criteria. The staging applies if malignancy is confirmed. Fine needle biopsy performed. -2 lesions were identified in the pancreatic tail. Cytology results are pending. However, the endosonographic appearance is suspicious for potential synchronous neuroendocrine tumors. Fine needle biopsy performed. - There was no sign of significant pathology in the common bile duct and in the common hepatic duct. - No malignant-appearing lymph nodes were visualized in the celiac region (level 20), peripancreatic region and porta hepatis region.  FINAL MICROSCOPIC DIAGNOSIS:  - Neuroendocrine neoplasm  The cytologic features are most consistent with pancreatic  neuroendocrine neoplasm.  A. GASTRIC BIOPSY:  Fragments of gastric mucosa with minimal vascular ectasia.  H. pylori, intestinal metaplasia, atrophy and dysplasia are not  identified.    Active Problems:   Symptomatic anemia   Past Medical History:  Diagnosis Date   (HFpEF) heart failure with preserved ejection fraction (HCC)    EF=60-60%   Actinic keratosis 01/17/2015   R forearm   Adjustment disorder with anxiety    Adverse effect of other narcotics, sequela    Intolerance to all  narcotics   Anti-Duffy antibodies present    Aortic valve stenosis    Arthritis    "some in my hands" (11/11/2012)   Atrial fibrillation, permanent (HCC)    Eliquis   Atypical mole 03/25/2018   L forearm - severe   Automatic implantable cardioverter-defibrillator in situ    Avascular necrosis of hip (HCC) 05/03/2011   Carotid artery stenosis 09/2007   a. 09/2007: 60-79% bilateral (stable); b. 10/2008: 40-59% R 60-79%    Cellulitis of left lower extremity 07/13/2020   CKD (chronic kidney disease), stage III (HCC)    COPD (chronic obstructive pulmonary disease) (HCC)    Coronary artery disease    non-obstructive by 2006 cath   COVID-19 virus infection 02/12/2022   Displaced fracture of left femoral neck (HCC) 10/09/2018   GI bleed 03/28/2020   AVM   High cholesterol    Hypertension 05/20/2011   Hypertr obst cardiomyop    Hypotension, unspecified    cardiac cath 2006..nonobstructive CAD 30-40s lesions.Marland KitchenETT 1/09 nondiagnostic due to poor HR response..Right Renal Cancer 2003   Iron deficiency anemia    Long term (current)  use of anticoagulants    On home oxygen therapy    3L    Osteoarthritis of right hip    PAD (peripheral artery disease) (HCC)    Pancreatic cancer (HCC)    sandostatin   PONV (postoperative nausea and vomiting)    Presence of permanent cardiac pacemaker    Pulmonary HTN (HCC)    RECTAL BLEEDING 10/13/2009   Qualifier: Diagnosis of  By: Wilmon Pali NP, Gibril Mastro     Red blood cell antibody positive with compatible PRBC difficult to obtain    Anti FYA (Duffy a) antibody. Must be transfused with PRB which are Duffy Antigen Negative and Crossmatch Compatible   Renal cell carcinoma (HCC)    s/p nephrectomy   Squamous cell carcinoma of skin 01/17/2015   R lat wrist   Squamous cell carcinoma of skin 01/29/2018   R post upper leg - superficially invasive   Squamous cell carcinoma of skin 03/25/2018   L lat foot   Squamous cell carcinoma of skin 11/09/2020   left lat  foot - EDC 01/01/21, recurrent 01/31/21 - MOHs 02/22/21   Squamous cell carcinoma of skin 12/19/2021   Right Posterior Medial Thigh, EDC   Squamous cell carcinoma of skin 12/19/2021   SCC IS, L lat heel, EDC 02/04/2022   Squamous cell carcinoma of skin 01/21/2022   L forearm, EDC 02/04/2022   Squamous cell carcinoma of skin 01/21/2022   R lower leg below knee, EDC 02/04/2022   Squamous cell carcinoma of skin 01/21/2022   SCCIS, R post heel, EDC 02/04/2022   Squamous cell carcinoma of skin 07/01/2022   left lower abdomen, in situ, EDC   Squamous cell carcinoma of skin 07/01/2022   left medial chest, EDC   Urge incontinence    Venous stasis of both lower extremities     Past Surgical History:  Procedure Laterality Date   ABDOMINAL AORTOGRAM W/LOWER EXTREMITY N/A 02/12/2021   Procedure: ABDOMINAL AORTOGRAM W/LOWER EXTREMITY;  Surgeon: Maeola Harman, MD;  Location: [facility] INVASIVE CV LAB;  Service: Cardiovascular;  Laterality: N/A;   ABDOMINAL HYSTERECTOMY  1975   for benign causes   APPENDECTOMY     BI-VENTRICULAR PACEMAKER UPGRADE  05/04/2010   BIOPSY  02/28/2019   Procedure: BIOPSY;  Surgeon: Rachael Fee, MD;  Location: [facility] ENDOSCOPY;  Service: Endoscopy;;   BIOPSY  03/04/2022   Procedure: BIOPSY;  Surgeon: Lemar Lofty., MD;  Location: [facility] ENDOSCOPY;  Service: Gastroenterology;;   CARDIAC CATHETERIZATION  2006   CARDIOVERSION N/A 02/20/2018   Procedure: CARDIOVERSION;  Surgeon: Antonieta Iba, MD;  Location: [facility] ORS;  Service: Cardiovascular;  Laterality: N/A;   CARDIOVERSION N/A 03/27/2018   Procedure: CARDIOVERSION;  Surgeon: Antonieta Iba, MD;  Location: [facility] ORS;  Service: Cardiovascular;  Laterality: N/A;   CATARACT EXTRACTION W/ INTRAOCULAR LENS  IMPLANT, BILATERAL  01/2006-02-2006   CHOLECYSTECTOMY N/A 11/11/2012   Procedure: LAPAROSCOPIC CHOLECYSTECTOMY WITH INTRAOPERATIVE CHOLANGIOGRAM;  Surgeon: Wilmon Arms. Corliss Skains, MD;  Location: [facility] OR;  Service:  General;  Laterality: N/A;   COLONOSCOPY WITH PROPOFOL N/A 02/28/2019   Procedure: COLONOSCOPY WITH PROPOFOL;  Surgeon: Rachael Fee, MD;  Location: [facility] ENDOSCOPY;  Service: Endoscopy;  Laterality: N/A;   ENTEROSCOPY N/A 03/30/2020   Procedure: ENTEROSCOPY;  Surgeon: Shellia Cleverly, DO;  Location: [facility] ENDOSCOPY;  Service: Gastroenterology;  Laterality: N/A;   ENTEROSCOPY N/A 02/10/2021   Procedure: ENTEROSCOPY;  Surgeon: Jeani Hawking, MD;  Location: [facility] ENDOSCOPY;  Service: Endoscopy;  Laterality: N/A;   ENTEROSCOPY  N/A 02/15/2022   Procedure: ENTEROSCOPY;  Surgeon: Imogene Burn, MD;  Location: [facility] ENDOSCOPY;  Service: Gastroenterology;  Laterality: N/A;   EP IMPLANTABLE DEVICE N/A 02/21/2016   Procedure: ICD Generator Changeout;  Surgeon: Duke Salvia, MD;  Location: [facility] INVASIVE CV LAB;  Service: Cardiovascular;  Laterality: N/A;   ESOPHAGOGASTRODUODENOSCOPY N/A 03/04/2022   Procedure: ESOPHAGOGASTRODUODENOSCOPY (EGD);  Surgeon: Lemar Lofty., MD;  Location: [facility] ENDOSCOPY;  Service: Gastroenterology;  Laterality: N/A;   ESOPHAGOGASTRODUODENOSCOPY (EGD) WITH PROPOFOL N/A 02/28/2019   Procedure: ESOPHAGOGASTRODUODENOSCOPY (EGD) WITH PROPOFOL;  Surgeon: Rachael Fee, MD;  Location: [facility] ENDOSCOPY;  Service: Endoscopy;  Laterality: N/A;   EUS N/A 03/04/2022   Procedure: UPPER ENDOSCOPIC ULTRASOUND (EUS) RADIAL;  Surgeon: Lemar Lofty., MD;  Location: [facility] ENDOSCOPY;  Service: Gastroenterology;  Laterality: N/A;   FINE NEEDLE ASPIRATION N/A 03/04/2022   Procedure: FINE NEEDLE ASPIRATION (FNA) LINEAR;  Surgeon: Lemar Lofty., MD;  Location: [facility] ENDOSCOPY;  Service: Gastroenterology;  Laterality: N/A;   GIVENS CAPSULE STUDY N/A 03/15/2019   Procedure: GIVENS CAPSULE STUDY;  Surgeon: Tressia Danas, MD;  Location: [facility] ENDOSCOPY;  Service: Gastroenterology;  Laterality: N/A;   HOT HEMOSTASIS N/A 03/30/2020   Procedure: HOT HEMOSTASIS (ARGON PLASMA  COAGULATION/BICAP);  Surgeon: Shellia Cleverly, DO;  Location: [facility] ENDOSCOPY;  Service: Gastroenterology;  Laterality: N/A;   HOT HEMOSTASIS N/A 02/10/2021   Procedure: HOT HEMOSTASIS (ARGON PLASMA COAGULATION/BICAP);  Surgeon: Jeani Hawking, MD;  Location: [facility] ENDOSCOPY;  Service: Endoscopy;  Laterality: N/A;   HOT HEMOSTASIS N/A 02/15/2022   Procedure: HOT HEMOSTASIS (ARGON PLASMA COAGULATION/BICAP);  Surgeon: Imogene Burn, MD;  Location: [facility] ENDOSCOPY;  Service: Gastroenterology;  Laterality: N/A;   INSERT / REPLACE / REMOVE PACEMAKER  05-01-11   02-28-05-/05-04-10-ICD-MEDTRONIC MAXIMAL DR   IR ANGIOGRAM SELECTIVE EACH ADDITIONAL VESSEL  02/18/2022   IR ANGIOGRAM SELECTIVE EACH ADDITIONAL VESSEL  02/18/2022   IR ANGIOGRAM VISCERAL SELECTIVE  02/16/2022   IR ANGIOGRAM VISCERAL SELECTIVE  02/16/2022   IR EMBO ART  VEN HEMORR LYMPH EXTRAV  INC GUIDE ROADMAPPING  02/16/2022   IR THORACENTESIS ASP PLEURAL SPACE W/IMG GUIDE  02/21/2022   IR THORACENTESIS ASP PLEURAL SPACE W/IMG GUIDE  02/22/2022   IR US GUIDE VASC ACCESS RIGHT  02/16/2022   JOINT REPLACEMENT     LAPAROSCOPIC CHOLECYSTECTOMY  11/11/2012   LAPAROSCOPIC LYSIS OF ADHESIONS N/A 11/11/2012   Procedure: LAPAROSCOPIC LYSIS OF ADHESIONS;  Surgeon: Wilmon Arms. Corliss Skains, MD;  Location: [facility] OR;  Service: General;  Laterality: N/A;   NEPHRECTOMY Right 06/2001    S/P RENAL CELL CANCER   PERIPHERAL VASCULAR INTERVENTION Bilateral 02/12/2021   Procedure: PERIPHERAL VASCULAR INTERVENTION;  Surgeon: Maeola Harman, MD;  Location: [facility] INVASIVE CV LAB;  Service: Cardiovascular;  Laterality: Bilateral;  Iliac artery stents   PRESSURE SENSOR/CARDIOMEMS N/A 02/03/2019   Procedure: PRESSURE SENSOR/CARDIOMEMS;  Surgeon: Laurey Morale, MD;  Location: [facility] INVASIVE CV LAB;  Service: Cardiovascular;  Laterality: N/A;   RIGHT HEART CATH N/A 11/09/2018   Procedure: RIGHT HEART CATH;  Surgeon: Laurey Morale, MD;  Location: [facility] INVASIVE CV LAB;  Service:  Cardiovascular;  Laterality: N/A;   RIGHT HEART CATH N/A 03/08/2019   Procedure: RIGHT HEART CATH;  Surgeon: Laurey Morale, MD;  Location: [facility] INVASIVE CV LAB;  Service: Cardiovascular;  Laterality: N/A;   SUBMUCOSAL TATTOO INJECTION  02/28/2019   Procedure: SUBMUCOSAL TATTOO INJECTION;  Surgeon: Rachael Fee, MD;  Location: [facility] ENDOSCOPY;  Service: Endoscopy;;   TOTAL HIP ARTHROPLASTY  Right 05/03/2011   Procedure: TOTAL HIP ARTHROPLASTY ANTERIOR APPROACH;  Surgeon: Kathryne Hitch;  Location: [facility] ORS;  Service: Orthopedics;  Laterality: Right;  Removal of Cannulated Screws Right Hip, Right Direct Anterior Hip Replacement   TOTAL HIP ARTHROPLASTY Left 10/09/2018   Procedure: TOTAL HIP ARTHROPLASTY ANTERIOR APPROACH;  Surgeon: Samson Frederic, MD;  Location: [facility] OR;  Service: Orthopedics;  Laterality: Left;    Family History  Problem Relation Age of Onset   Heart failure Mother    Early death Father        car accident   Breast cancer Maternal Aunt 4   Breast cancer Cousin    Breast cancer Other    Colon cancer Neg Hx    Esophageal cancer Neg Hx    Liver disease Neg Hx    Pancreatic cancer Neg Hx    Rectal cancer Neg Hx     Prior to Admission medications   Medication Sig Start Date End Date Taking? Authorizing Provider  acetaminophen (TYLENOL) 500 MG tablet Take 1,000 mg by mouth every 8 (eight) hours as needed for mild pain or headache.   Yes [provider]  Biotin 1000 MCG CHEW Chew 2 1e11 Vector Genomes by mouth daily.   Yes [provider]  Cyanocobalamin (B-12 PO) Take 1 tablet by mouth daily.   Yes [provider]  ELIQUIS 2.5 MG TABS tablet TAKE ONE TABLET BY MOUTH TWICE A DAY 07/25/22  Yes Laurey Morale, MD  fluticasone [facility]) 50 MCG/ACT nasal spray Place 2 sprays into both nostrils daily as needed for allergies or rhinitis. 07/04/22  Yes [name], MD  potassium chloride SA (KLOR-CON M) 20 MEQ tablet Take 2 tablets (40 mEq  total) by mouth daily. Patient taking differently: Take 20 mEq by mouth 2 (two) times daily. 08/21/22  Yes [place], Anderson Malta, FNP  rosuvastatin (CRESTOR) 40 MG tablet Take 1 tablet (40 mg total) by mouth daily. Patient taking differently: Take 40 mg by mouth every evening. 05/02/22  Yes [name], MD  torsemide (DEMADEX) 20 MG tablet Take 4 tablets (80 mg total) by mouth 2 (two) times daily. 08/21/22  Yes [place], Anderson Malta, FNP  vitamin C (ASCORBIC ACID) 250 MG tablet Take 500 mg by mouth daily.   Yes [provider]  VITAMIN D PO Take 4,000 Units by mouth daily.   Yes [provider]  dapagliflozin propanediol (FARXIGA) 10 MG TABS tablet Take 1 tablet (10 mg total) by mouth daily before breakfast. Patient not taking: Reported on 11/04/2022 06/10/22   Laurey Morale, MD  gabapentin (NEURONTIN) 100 MG capsule Take 1 capsule (100 mg total) by mouth at bedtime for 3 days, THEN 1 capsule (100 mg total) 2 (two) times daily for 3 days, THEN 1 capsule (100 mg total) 3 (three) times daily for 3 days. Patient not taking: Reported on 09/16/2022 09/11/22 09/20/22  [name], MD    Current Facility-Administered Medications  Medication Dose Route Frequency Provider Last Rate Last Admin   0.9 %  sodium chloride infusion (Manually program via Guardrails IV Fluids)   Intravenous Once Fayrene Helper, MD   Held at 11/04/22 2014   acetaminophen (TYLENOL) tablet 650 mg  650 mg Oral Once Fayrene Helper, MD       acetaminophen (TYLENOL) tablet 650 mg  650 mg Oral Q6H PRN Dow Adolph N, DO   650 mg at 11/05/22 4098   diphenhydrAMINE (BENADRYL) capsule 50 mg  50 mg Oral Once Fayrene Helper, MD  melatonin tablet 5 mg  5 mg Oral QHS PRN Dow Adolph N, DO       methylPREDNISolone sodium succinate (SOLU-MEDROL) 40 mg/mL injection 40 mg  40 mg Intravenous Once Fayrene Helper, MD       pantoprazole (PROTONIX) injection 40 mg  40 mg Intravenous BID Dow Adolph N, DO   40 mg at 11/05/22  1610   prochlorperazine (COMPAZINE) injection 5 mg  5 mg Intravenous Q6H PRN Dow Adolph N, DO   5 mg at 11/05/22 0306    Allergies as of 11/04/2022 - Review Complete 11/04/2022  Allergen Reaction Noted   Whole blood Itching and Nausea And Vomiting 02/08/2021    Social History   Socioeconomic History   Marital status: Widowed    Spouse name: Not on file   Number of children: 2   Years of education: high school   Highest education level: Not on file  Occupational History   Occupation: Retired    Associate Professor: RETIRED  Tobacco Use   Smoking status: Former    Packs/day: 0.50    Years: 40.00    Additional pack years: 0.00    Total pack years: 20.00    Types: Cigarettes    Quit date: 05/06/2001    Years since quitting: 21.5   Smokeless tobacco: Never  Vaping Use   Vaping Use: Never used  Substance and Sexual Activity   Alcohol use: No    Alcohol/week: 0.0 standard drinks of alcohol   Drug use: No   Sexual activity: Not Currently  Other Topics Concern   Not on file  Social History Narrative   Would desire CPR   07/11/20   From: the area   Living: with great-grandson - Joselyn Glassman (2003)   Work: retired - clerical      Family: 2 children - Darl [place] and [place] - 2 grand chlidren - 5 great grand children      Enjoys: spending time with friends - watch move, eat out, spend time      Exercise: walking around the house   Diet: not good, limits red meat, limits salt, low appetite      Safety   Seat belts: Yes    Guns: Yes  and secure   Safe in relationships: Yes    Social Determinants of Health   Financial Resource Strain: Low Risk  (05/28/2022)   Overall Financial Resource Strain (CARDIA)    Difficulty of Paying Living Expenses: Not hard at all  Food Insecurity: No Food Insecurity (11/04/2022)   Hunger Vital Sign    Worried About Running Out of Food in the Last Year: Never true    Ran Out of Food in the Last Year: Never true  Transportation Needs: No Transportation Needs  (11/04/2022)   PRAPARE - Administrator, Civil Service (Medical): No    Lack of Transportation (Non-Medical): No  Physical Activity: Inactive (09/24/2021)   Exercise Vital Sign    Days of Exercise per Week: 0 days    Minutes of Exercise per Session: 0 min  Stress: No Stress Concern Present (09/24/2021)   [employer] of Occupational Health - Occupational Stress Questionnaire    Feeling of Stress : Not at all  Social Connections: Not on file  Intimate Partner Violence: Not At Risk (11/04/2022)   Humiliation, Afraid, Rape, and Kick questionnaire    Fear of Current or Ex-Partner: No    Emotionally Abused: No    Physically Abused: No    Sexually Abused: No  Code Status   Code Status: DNR  Review of Systems: All systems reviewed and negative except where noted in HPI.  Physical Exam: Vital signs in last 24 hours: Temp:  [98.2 F (36.8 C)-98.6 F (37 C)] 98.2 F (36.8 C) (07/02 0400) Pulse Rate:  [55-96] 60 (07/02 0712) Resp:  [15-22] 18 (07/02 0712) BP: (102-146)/(37-90) 109/53 (07/02 0927) SpO2:  [90 %-100 %] 100 % (07/02 0712) Weight:  [62.5 kg] 62.5 kg (07/01 2138) Last BM Date : 11/04/22  General:  Pleasant female in NAD Psych:  Cooperative. Normal mood and affect Eyes: Pupils equal Ears:  Normal auditory acuity Nose: No deformity, discharge or lesions Neck:  Supple, no masses felt Lungs:  Clear to auscultation.  Heart:  Regular rate,  loud murmur Abdomen:  Soft, nondistended, nontender, active bowel sounds, no masses felt Rectal :  Deferred Msk: Symmetrical without gross deformities.  Neurologic:  Alert, oriented, grossly normal neurologically Extremities : No edema Skin:  Intact without significant lesions.    Intake/Output from previous day: 07/01 0701 - 07/02 0700 In: 980 [P.O.:480; IV Piggyback:500] Out: -  Intake/Output this shift:  No intake/output data recorded.     Willette Cluster, NP-C @  11/05/2022, 9:46 AM

## 2022-11-05 NOTE — Consult Note (Signed)
                                             Consultation Note   Referring Provider:  [facility] PCP: Gutierrez, Javier, MD Primary Gastroenterologist: Vito Cirigliano, MD        Reason for consultation: melena  DOA: 11/04/2022         Hospital Day: 2   Assessment and Plan   89 yo female with a history of small bowel AVMs admitted with melena and symptomatic acute on chronic anemia. Suspect bleeding is from small bowel AVMs in setting of Eliquis Hgb 6.5, down from baseline 9-10 -2 units of RBCs ordered, coming from [place] due to history of blood transfusion reaction  -Plan for small bowel endoscopy tomorrow ( if gets transfused). The risks and benefits of small bowel endoscopy  were discussed with the patient who agrees to proceed.  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 Surgery wasn't recommended in multifocal NET and given age and co-morbidities. Treated with sandostatin injections   A-Fib on Eliquis -Last dose was 7/1 am   CKD3  See PMH for additional medical problems   History of Present Illness Patient is a 89 y.o. year old female whose past medical history includes but is not necessarily limited to nonischemic cardiomyopathy, permanent A-Fib on Eliquis, chronic hypoxia on 2 L nasal cannula continuously, carotid artery stenosis, peripheral artery disease, hypertension, hyperlipidemia, chronic HFpEF, hypothyroidism, CKD 3, RCC s/p nephrectomy, Small bowel AVMs, ischemic colitis, diverticulosis, pancreatic neuroendocrine tumors  Jimma was admitted yesterday with melena and fatigue. Someone stopped her iron several months ago so stool not typically black. Noticed black stools starting a couple of weeks ago. No abdominal pain to speak of. She doesn't take NSAIDS. No reflux symptoms. No on a PPI. She is on Eliquis.   Baseline hg ~ 10 >>  8.8 >> 6.5 today.  Ferritin is 32, probably iron deficient BUN 48, Cr 1.62  CTA abd / pelvis negative for GI bleed, + cirrhosis with indeterminate liver lesions and enhancing pancreatic masses, slightly increased.   Labs and Imaging: Recent Labs    11/04/22 1453  WBC 3.7*  HGB 6.5*  HCT 21.9*  PLT 187   Recent Labs    11/04/22 1453  NA 139  K 3.8  CL 100  CO2 28  GLUCOSE 135*  BUN 48*  CREATININE 1.62*  CALCIUM 9.2   No results for input(s): "PROT", "ALBUMIN", "AST", "ALT", "ALKPHOS", "BILITOT", "BILIDIR", "IBILI" in the last 72 hours. No results for input(s): "HEPBSAG", "HCVAB", "HEPAIGM", "HEPBIGM" in the last 72 hours. No results for input(s): "LABPROT", "INR" in the last 72 hours.  Previous GI Evaluation:   **Most recent studies. Not all inclusive**  Oct 2020 colonoscopy for iron deficiency anemia Good prep.  -Diverticulosis throughout colon  - Patchy, moderate inflammation in the sigmoid colon. I have most concern for ischemic, possibly chronic. Two sites were biopsied. These findings could possibly explain acute on chronic, heme + anemia. - I have concerns about what appear to be ischemic changes in her left colon causing bleeding, anemia. I recommend up to date cardilogy consultation as she has known CHF, Afib and also consider messenteric vascular workup. -   Nov 2020 Capsule   study   Oct 2022 Small bowel endoscopy for melena - Normal esophagus. - A few non-bleeding angiodysplastic lesions in the stomach. Treated with a monopolar probe. - Four bleeding angiodysplastic lesions in the duodenum. Treated with a monopolar probe. - A single non-bleeding angiodysplastic lesion in the jejunum. Treated with a monopolar probe. - No specimens collected.  Oct 2023 small bowel endoscopy for melena - Normal esophagus. - Hiatal hernia. - Gastritis. - Two non-bleeding angioectasias in the duodenum. Treated with APC - The examined portion of the jejunum was normal. - No  specimens collected. -   Oct 2023 EGD and EUS EGD impression: - No gross lesions in the entire esophagus. Z-line irregular, 38 cm from the incisors. - 3 cm hiatal hernia. - Erosive gastropathy with no bleeding and no stigmata of recent bleeding. No other gross lesions in the entire stomach. Biopsied. - Duodenal erosion without bleeding -likely site of prior APC. No other gross lesions in the duodenal bulb, in the first portion of the duodenum and in the second portion of the duodenum. - Normal major papilla pointed superiorly.   EUS impression: - A mass was identified in the pancreatic head/uncinate process. Cytology results are pending. However, the endosonographic appearance is suggestive of a neuroendocrine tumor. This was staged T3 N0 Mx by endosonographic criteria. The staging applies if malignancy is confirmed. Fine needle biopsy performed. -2 lesions were identified in the pancreatic tail. Cytology results are pending. However, the endosonographic appearance is suspicious for potential synchronous neuroendocrine tumors. Fine needle biopsy performed. - There was no sign of significant pathology in the common bile duct and in the common hepatic duct. - No malignant-appearing lymph nodes were visualized in the celiac region (level 20), peripancreatic region and porta hepatis region.  FINAL MICROSCOPIC DIAGNOSIS:  - Neuroendocrine neoplasm  The cytologic features are most consistent with pancreatic  neuroendocrine neoplasm.  A. GASTRIC BIOPSY:  Fragments of gastric mucosa with minimal vascular ectasia.  H. pylori, intestinal metaplasia, atrophy and dysplasia are not  identified.    Active Problems:   Symptomatic anemia   Past Medical History:  Diagnosis Date   (HFpEF) heart failure with preserved ejection fraction (HCC)    EF=60-60%   Actinic keratosis 01/17/2015   R forearm   Adjustment disorder with anxiety    Adverse effect of other narcotics, sequela    Intolerance to all  narcotics   Anti-Duffy antibodies present    Aortic valve stenosis    Arthritis    "some in my hands" (11/11/2012)   Atrial fibrillation, permanent (HCC)    Eliquis   Atypical mole 03/25/2018   L forearm - severe   Automatic implantable cardioverter-defibrillator in situ    Avascular necrosis of hip (HCC) 05/03/2011   Carotid artery stenosis 09/2007   a. 09/2007: 60-79% bilateral (stable); b. 10/2008: 40-59% R 60-79%    Cellulitis of left lower extremity 07/13/2020   CKD (chronic kidney disease), stage III (HCC)    COPD (chronic obstructive pulmonary disease) (HCC)    Coronary artery disease    non-obstructive by 2006 cath   COVID-19 virus infection 02/12/2022   Displaced fracture of left femoral neck (HCC) 10/09/2018   GI bleed 03/28/2020   AVM   High cholesterol    Hypertension 05/20/2011   Hypertr obst cardiomyop    Hypotension, unspecified    cardiac cath 2006..nonobstructive CAD 30-40s lesions..ETT 1/09 nondiagnostic due to poor HR response..Right Renal Cancer 2003   Iron deficiency anemia    Long term (current)   use of anticoagulants    On home oxygen therapy    3L [place]   Osteoarthritis of right hip    PAD (peripheral artery disease) (HCC)    Pancreatic cancer (HCC)    sandostatin   PONV (postoperative nausea and vomiting)    Presence of permanent cardiac pacemaker    Pulmonary HTN (HCC)    RECTAL BLEEDING 10/13/2009   Qualifier: Diagnosis of  By: Kynsli Haapala NP, Adden Strout     Red blood cell antibody positive with compatible PRBC difficult to obtain    Anti FYA (Duffy a) antibody. Must be transfused with PRB which are Duffy Antigen Negative and Crossmatch Compatible   Renal cell carcinoma (HCC)    s/p nephrectomy   Squamous cell carcinoma of skin 01/17/2015   R lat wrist   Squamous cell carcinoma of skin 01/29/2018   R post upper leg - superficially invasive   Squamous cell carcinoma of skin 03/25/2018   L lat foot   Squamous cell carcinoma of skin 11/09/2020   left lat  foot - EDC 01/01/21, recurrent 01/31/21 - MOHs 02/22/21   Squamous cell carcinoma of skin 12/19/2021   Right Posterior Medial Thigh, EDC   Squamous cell carcinoma of skin 12/19/2021   SCC IS, L lat heel, EDC 02/04/2022   Squamous cell carcinoma of skin 01/21/2022   L forearm, EDC 02/04/2022   Squamous cell carcinoma of skin 01/21/2022   R lower leg below knee, EDC 02/04/2022   Squamous cell carcinoma of skin 01/21/2022   SCCIS, R post heel, EDC 02/04/2022   Squamous cell carcinoma of skin 07/01/2022   left lower abdomen, in situ, EDC   Squamous cell carcinoma of skin 07/01/2022   left medial chest, EDC   Urge incontinence    Venous stasis of both lower extremities     Past Surgical History:  Procedure Laterality Date   ABDOMINAL AORTOGRAM W/LOWER EXTREMITY N/A 02/12/2021   Procedure: ABDOMINAL AORTOGRAM W/LOWER EXTREMITY;  Surgeon: Cain, Brandon Christopher, MD;  [facility];  Service: Cardiovascular;  Laterality: N/A;   ABDOMINAL HYSTERECTOMY  1975   for benign causes   APPENDECTOMY     BI-VENTRICULAR PACEMAKER UPGRADE  05/04/2010   BIOPSY  02/28/2019   Procedure: BIOPSY;  Surgeon: Jacobs, Daniel P, MD;  [facility]: [facility];  Service: Endoscopy;;   BIOPSY  03/04/2022   Procedure: BIOPSY;  Surgeon: Mansouraty, Gabriel Jr., MD;  Location: [facility];  Service: Gastroenterology;;   CARDIAC CATHETERIZATION  2006   CARDIOVERSION N/A 02/20/2018   Procedure: CARDIOVERSION;  Surgeon: Gollan, Timothy J, MD;  Location: [facility];  Service: Cardiovascular;  Laterality: N/A;   CARDIOVERSION N/A 03/27/2018   Procedure: CARDIOVERSION;  Surgeon: Gollan, Timothy J, MD;  Location: [facility];  Service: Cardiovascular;  Laterality: N/A;   CATARACT EXTRACTION W/ INTRAOCULAR LENS  IMPLANT, BILATERAL  01/2006-02-2006   CHOLECYSTECTOMY N/A 11/11/2012   Procedure: LAPAROSCOPIC CHOLECYSTECTOMY WITH INTRAOPERATIVE CHOLANGIOGRAM;  Surgeon: Matthew K. Tsuei, MD;  [facility]: [facility];  Service:  General;  Laterality: N/A;   COLONOSCOPY WITH PROPOFOL N/A 02/28/2019   Procedure: COLONOSCOPY WITH PROPOFOL;  Surgeon: Jacobs, Daniel P, MD;  [facility];  Service: Endoscopy;  Laterality: N/A;   ENTEROSCOPY N/A 03/30/2020   Procedure: ENTEROSCOPY;  Surgeon: Cirigliano, Vito V, DO;  [facility];  Service: Gastroenterology;  Laterality: N/A;   ENTEROSCOPY N/A 02/10/2021   Procedure: ENTEROSCOPY;  Surgeon: Hung, Patrick, MD;  [facility];  Service: Endoscopy;  Laterality: N/A;   ENTEROSCOPY   N/A 02/15/2022   Procedure: ENTEROSCOPY;  Surgeon: Dorsey, Ying C, MD;  [facility];  Service: Gastroenterology;  Laterality: N/A;   EP IMPLANTABLE DEVICE N/A 02/21/2016   Procedure: ICD Generator Changeout;  Surgeon: Steven C Klein, MD;  [facility];  Service: Cardiovascular;  Laterality: N/A;   ESOPHAGOGASTRODUODENOSCOPY N/A 03/04/2022   Procedure: ESOPHAGOGASTRODUODENOSCOPY (EGD);  Surgeon: Mansouraty, Gabriel Jr., MD;  [facility]: [facility];  Service: Gastroenterology;  Laterality: N/A;   ESOPHAGOGASTRODUODENOSCOPY (EGD) WITH PROPOFOL N/A 02/28/2019   Procedure: ESOPHAGOGASTRODUODENOSCOPY (EGD) WITH PROPOFOL;  Surgeon: Jacobs, Daniel P, MD;  [facility];  Service: Endoscopy;  Laterality: N/A;   EUS N/A 03/04/2022   Procedure: UPPER ENDOSCOPIC ULTRASOUND (EUS) RADIAL;  Surgeon: Mansouraty, Gabriel Jr., MD;  Location: [facility];  Service: Gastroenterology;  Laterality: N/A;   FINE NEEDLE ASPIRATION N/A 03/04/2022   Procedure: FINE NEEDLE ASPIRATION (FNA) LINEAR;  Surgeon: Mansouraty, Gabriel Jr., MD;  [facility]: [facility];  Service: Gastroenterology;  Laterality: N/A;   GIVENS CAPSULE STUDY N/A 03/15/2019   Procedure: GIVENS CAPSULE STUDY;  Surgeon: Beavers, Kimberly, MD;  [facility];  Service: Gastroenterology;  Laterality: N/A;   HOT HEMOSTASIS N/A 03/30/2020   Procedure: HOT HEMOSTASIS (ARGON PLASMA  COAGULATION/BICAP);  Surgeon: Cirigliano, Vito V, DO;  [facility];  Service: Gastroenterology;  Laterality: N/A;   HOT HEMOSTASIS N/A 02/10/2021   Procedure: HOT HEMOSTASIS (ARGON PLASMA COAGULATION/BICAP);  Surgeon: Hung, Patrick, MD;  [facility];  Service: Endoscopy;  Laterality: N/A;   HOT HEMOSTASIS N/A 02/15/2022   Procedure: HOT HEMOSTASIS (ARGON PLASMA COAGULATION/BICAP);  Surgeon: Dorsey, Ying C, MD;  [facility];  Service: Gastroenterology;  Laterality: N/A;   INSERT / REPLACE / REMOVE PACEMAKER  05-01-11   02-28-05-/05-04-10-ICD-MEDTRONIC MAXIMAL DR   IR ANGIOGRAM SELECTIVE EACH ADDITIONAL VESSEL  02/18/2022   IR ANGIOGRAM SELECTIVE EACH ADDITIONAL VESSEL  02/18/2022   IR ANGIOGRAM VISCERAL SELECTIVE  02/16/2022   IR ANGIOGRAM VISCERAL SELECTIVE  02/16/2022   IR EMBO ART  VEN HEMORR LYMPH EXTRAV  INC GUIDE ROADMAPPING  02/16/2022   IR THORACENTESIS ASP PLEURAL SPACE W/IMG GUIDE  02/21/2022   IR THORACENTESIS ASP PLEURAL SPACE W/IMG GUIDE  02/22/2022   IR US GUIDE VASC ACCESS RIGHT  02/16/2022   JOINT REPLACEMENT     LAPAROSCOPIC CHOLECYSTECTOMY  11/11/2012   LAPAROSCOPIC LYSIS OF ADHESIONS N/A 11/11/2012   Procedure: LAPAROSCOPIC LYSIS OF ADHESIONS;  Surgeon: Matthew K. Tsuei, MD;  [facility];  Service: General;  Laterality: N/A;   NEPHRECTOMY Right 06/2001    S/P RENAL CELL CANCER   PERIPHERAL VASCULAR INTERVENTION Bilateral 02/12/2021   Procedure: PERIPHERAL VASCULAR INTERVENTION;  Surgeon: Cain, Brandon Christopher, MD;  [facility];  Service: Cardiovascular;  Laterality: Bilateral;  Iliac artery stents   PRESSURE SENSOR/CARDIOMEMS N/A 02/03/2019   Procedure: PRESSURE SENSOR/CARDIOMEMS;  Surgeon: McLean, Dalton S, MD;  [facility];  Service: Cardiovascular;  Laterality: N/A;   RIGHT HEART CATH N/A 11/09/2018   Procedure: RIGHT HEART CATH;  Surgeon: McLean, Dalton S, MD;  [facility];  Service:  Cardiovascular;  Laterality: N/A;   RIGHT HEART CATH N/A 03/08/2019   Procedure: RIGHT HEART CATH;  Surgeon: McLean, Dalton S, MD;  [facility];  Service: Cardiovascular;  Laterality: N/A;   SUBMUCOSAL TATTOO INJECTION  02/28/2019   Procedure: SUBMUCOSAL TATTOO INJECTION;  Surgeon: Jacobs, Daniel P, MD;  [facility];  Service: Endoscopy;;   TOTAL HIP ARTHROPLASTY   Right 05/03/2011   Procedure: TOTAL HIP ARTHROPLASTY ANTERIOR APPROACH;  Surgeon: Christopher Y Blackman;  Location: [facility];  Service: Orthopedics;  Laterality: Right;  Removal of Cannulated Screws Right Hip, Right Direct Anterior Hip Replacement   TOTAL HIP ARTHROPLASTY Left 10/09/2018   Procedure: TOTAL HIP ARTHROPLASTY ANTERIOR APPROACH;  Surgeon: Swinteck, Brian, MD;  [facility];  Service: Orthopedics;  Laterality: Left;    Family History  Problem Relation Age of Onset   Heart failure Mother    Early death Father        car accident   Breast cancer Maternal Aunt 82   Breast cancer Cousin    Breast cancer Other    Colon cancer Neg Hx    Esophageal cancer Neg Hx    Liver disease Neg Hx    Pancreatic cancer Neg Hx    Rectal cancer Neg Hx     Prior to Admission medications   Medication Sig Start Date End Date Taking? Authorizing Provider  acetaminophen (TYLENOL) 500 MG tablet Take 1,000 mg by mouth every 8 (eight) hours as needed for mild pain or headache.   Yes [provider]  Biotin 1000 MCG CHEW Chew 2 1e11 Vector Genomes by mouth daily.   Yes [provider]  Cyanocobalamin (B-12 PO) Take 1 tablet by mouth daily.   Yes [provider]  ELIQUIS 2.5 MG TABS tablet TAKE ONE TABLET BY MOUTH TWICE A DAY 07/25/22  Yes McLean, Dalton S, MD  fluticasone (FLONASE) 50 MCG/ACT nasal spray Place 2 sprays into both nostrils daily as needed for allergies or rhinitis. 07/04/22  Yes Gutierrez, Javier, MD  potassium chloride SA (KLOR-CON M) 20 MEQ tablet Take 2 tablets (40 mEq  total) by mouth daily. Patient taking differently: Take 20 mEq by mouth 2 (two) times daily. 08/21/22  Yes Milford, Jessica M, FNP  rosuvastatin (CRESTOR) 40 MG tablet Take 1 tablet (40 mg total) by mouth daily. Patient taking differently: Take 40 mg by mouth every evening. 05/02/22  Yes Gutierrez, Javier, MD  torsemide (DEMADEX) 20 MG tablet Take 4 tablets (80 mg total) by mouth 2 (two) times daily. 08/21/22  Yes Milford, Jessica M, FNP  vitamin C (ASCORBIC ACID) 250 MG tablet Take 500 mg by mouth daily.   Yes [provider]  VITAMIN D PO Take 4,000 Units by mouth daily.   Yes [provider]  dapagliflozin propanediol (FARXIGA) 10 MG TABS tablet Take 1 tablet (10 mg total) by mouth daily before breakfast. Patient not taking: Reported on 11/04/2022 06/10/22   McLean, Dalton S, MD  gabapentin (NEURONTIN) 100 MG capsule Take 1 capsule (100 mg total) by mouth at bedtime for 3 days, THEN 1 capsule (100 mg total) 2 (two) times daily for 3 days, THEN 1 capsule (100 mg total) 3 (three) times daily for 3 days. Patient not taking: Reported on 09/16/2022 09/11/22 09/20/22  Gutierrez, Javier, MD    Current Facility-Administered Medications  Medication Dose Route Frequency Provider Last Rate Last Admin   0.9 %  sodium chloride infusion (Manually program via Guardrails IV Fluids)   Intravenous Once Bennett, Briton, MD   Held at 11/04/22 2014   acetaminophen (TYLENOL) tablet 650 mg  650 mg Oral Once Bennett, Briton, MD       acetaminophen (TYLENOL) tablet 650 mg  650 mg Oral Q6H PRN Hall, Carole N, DO   650 mg at 11/05/22 0929   diphenhydrAMINE (BENADRYL) capsule 50 mg  50 mg Oral Once Bennett, Briton, MD         melatonin tablet 5 mg  5 mg Oral QHS PRN Hall, Carole N, DO       methylPREDNISolone sodium succinate (SOLU-MEDROL) 40 mg/mL injection 40 mg  40 mg Intravenous Once Bennett, Briton, MD       pantoprazole (PROTONIX) injection 40 mg  40 mg Intravenous BID Hall, Carole N, DO   40 mg at 11/05/22  0929   prochlorperazine (COMPAZINE) injection 5 mg  5 mg Intravenous Q6H PRN Hall, Carole N, DO   5 mg at 11/05/22 0306    Allergies as of 11/04/2022 - Review Complete 11/04/2022  Allergen Reaction Noted   Whole blood Itching and Nausea And Vomiting 02/08/2021    Social History   Socioeconomic History   Marital status: Widowed    Spouse name: Not on file   Number of children: 2   Years of education: high school   Highest education level: Not on file  Occupational History   Occupation: Retired    Employer: RETIRED  Tobacco Use   Smoking status: Former    Packs/day: 0.50    Years: 40.00    Additional pack years: 0.00    Total pack years: 20.00    Types: Cigarettes    Quit date: 05/06/2001    Years since quitting: 21.5   Smokeless tobacco: Never  Vaping Use   Vaping Use: Never used  Substance and Sexual Activity   Alcohol use: No    Alcohol/week: 0.0 standard drinks of alcohol   Drug use: No   Sexual activity: Not Currently  Other Topics Concern   Not on file  Social History Narrative   Would desire CPR   07/11/20   From: the area   Living: with great-grandson - Tyler (2003)   Work: retired - clerical      Family: 2 children - Susan and Kathy - 2 grand chlidren - 5 great grand children      Enjoys: spending time with friends - watch move, eat out, spend time      Exercise: walking around the house   Diet: not good, limits red meat, limits salt, low appetite      Safety   Seat belts: Yes    Guns: Yes  and secure   Safe in relationships: Yes    Social Determinants of Health   Financial Resource Strain: Low Risk  (05/28/2022)   Overall Financial Resource Strain (CARDIA)    Difficulty of Paying Living Expenses: Not hard at all  Food Insecurity: No Food Insecurity (11/04/2022)   Hunger Vital Sign    Worried About Running Out of Food in the Last Year: Never true    Ran Out of Food in the Last Year: Never true  Transportation Needs: No Transportation Needs  (11/04/2022)   PRAPARE - Transportation    Lack of Transportation (Medical): No    Lack of Transportation (Non-Medical): No  Physical Activity: Inactive (09/24/2021)   Exercise Vital Sign    Days of Exercise per Week: 0 days    Minutes of Exercise per Session: 0 min  Stress: No Stress Concern Present (09/24/2021)   [employer] - Occupational Stress Questionnaire    Feeling of Stress : Not at all  Social Connections: Not on file  Intimate Partner Violence: Not At Risk (11/04/2022)   Humiliation, Afraid, Rape, and Kick questionnaire    Fear of Current or Ex-Partner: No    Emotionally Abused: No    Physically Abused: No    Sexually Abused: No       Code Status   Code Status: DNR  Review of Systems: All systems reviewed and negative except where noted in HPI.  Physical Exam: Vital signs in last 24 hours: Temp:  [98.2 F (36.8 C)-98.6 F (37 C)] 98.2 F (36.8 C) (07/02 0400) Pulse Rate:  [55-96] 60 (07/02 0712) Resp:  [15-22] 18 (07/02 0712) BP: (102-146)/(37-90) 109/53 (07/02 0927) SpO2:  [90 %-100 %] 100 % (07/02 0712) Weight:  [62.5 kg] 62.5 kg (07/01 2138) Last BM Date : 11/04/22  General:  Pleasant female in NAD Psych:  Cooperative. Normal mood and affect Eyes: Pupils equal Ears:  Normal auditory acuity Nose: No deformity, discharge or lesions Neck:  Supple, no masses felt Lungs:  Clear to auscultation.  Heart:  Regular rate,  loud murmur Abdomen:  Soft, nondistended, nontender, active bowel sounds, no masses felt Rectal :  Deferred Msk: Symmetrical without gross deformities.  Neurologic:  Alert, oriented, grossly normal neurologically Extremities : No edema Skin:  Intact without significant lesions.    Intake/Output from previous day: 07/01 0701 - 07/02 0700 In: 980 [P.O.:480; IV Piggyback:500] Out: -  Intake/Output this shift:  No intake/output data recorded.     Keasha Malkiewicz, NP-C @  11/05/2022, 9:46 AM     

## 2022-11-05 NOTE — Progress Notes (Signed)
PT Cancellation Note  Patient Details Name: [name] MRN: [id number] DOB: 07-02-1933   Cancelled Treatment:    Reason Eval/Treat Not Completed: Medical issues which prohibited therapy; just now getting blood transfusion.  RN reports getting up to [facility] but daughter and pt do not feel she is ready for PT as mobility is very effortful.  Will attempt another day.   Elray Mcgregor 11/05/2022, 4:49 PM Sheran Lawless, PT Acute Rehabilitation Services Office:[phone] 11/05/2022

## 2022-11-05 NOTE — Progress Notes (Signed)
PROGRESS NOTE    [name]  WJX:[id number] DOB: 21-Feb-1934 DOA: 11/04/2022 PCP: [name], MD    Brief Narrative:  [name] is a 89 y.o. female with medical history significant for HCOM, nonischemic cardiomyopathy, permanent atrial fibrillation on Eliquis, chronic hypoxia on 2 L nasal cannula continuously, carotid artery stenosis, peripheral artery disease, hypertension, hyperlipidemia, chronic HFpEF, hypothyroidism, CKD 3B, recent herpes zoster infection, history of blood transfusion reaction, renal cell carcinoma status post nephrectomy in 2003, history of angiodysplasia of the stomach and duodenum/gastritis on daily PPI, who presented with generalized fatigue.  Associated with exertional shortness of breath.  On Thursday, 5 days ago the patient was seen by her primary care provider.  Blood work was obtained and revealed acute blood loss anemia with hemoglobin of 8K from 10K at baseline.  The patient was told to go to the ED.     Endorses melanic stools x 2 weeks.  Intermittent nausea with no vomiting.  No significant abdominal pain.  Also endorses lightheadedness upon standing.  She is compliant with her home Eliquis, last dose was taken this morning.  She decided to come to the ED for further evaluation.   Assessment and Plan: Acute blood loss anemia Symptomatic anemia Presented with melena x 2 weeks, exertional dyspnea, generalized fatigue, drop in hemoglobin Hemoglobin 6.8 on presentation -2 units PRBCs ordered to be transfused, PRBCs will come from [facility] due to history of transfusion reaction -Premedicate prior to blood transfusion -Obtain CBC post blood transfusion -GI consulted to assist due to concern for possible upper GI bleed   History of angiodysplasia of the stomach and duodenum History of gastritis -IV Protonix 40 mg twice daily   History of pancreatic cancer -Seen by Dr. Mosetta Putt outpatient, added to treatment team. Per the patient,  she missed 3 scheduled therapies due to herpes zoster infection, it has now resolved See CT scan report regarding pancreatic lesions.   Permanent atrial fibrillation on Eliquis Currently rate controlled. Eliquis on hold due to suspected upper GI bleed Restart Eliquis when okay with GI. Continue GI prophylaxis while on DOAC Monitor on telemetry   CKD 3B Appears to be close to her baseline Continue to avoid nephrotoxic agents and hypotension.   Mild leukopenia Monitor and repeat CBC   Generalized weakness, suspect contributed by severe anemia PT OT assessment Fall precautions     DVT prophylaxis: SCDs Start: 11/04/22 2044    Code Status: DNR Family Communication: at bedside  Disposition Plan:  Level of care: Progressive Status is: Inpatient Remains inpatient appropriate     Consultants:  GI   Subjective: Very weak when got up to use bedside commode   Objective: Vitals:   11/05/22 0700 11/05/22 0712 11/05/22 0900 11/05/22 0927  BP: (!) 123/43 (!) 123/45 (!) 131/50 (!) 109/53  Pulse: 60 60    Resp: 17 18    Temp:      TempSrc:  Oral    SpO2: 100% 100%    Weight:      Height:        Intake/Output Summary (Last 24 hours) at 11/05/2022 0933 Last data filed at 11/05/2022 0600 Gross per 24 hour  Intake 980 ml  Output --  Net 980 ml   Filed Weights   11/04/22 2138  Weight: 62.5 kg    Examination:   General: Appearance:    Frail appearing female in no acute distress     Lungs:     respirations unlabored  Heart:  Normal heart rate.    MS:   All extremities are intact.    Neurologic:   Awake, alert       Data Reviewed: I have personally reviewed following labs and imaging studies  CBC: Recent Labs  Lab 10/31/22 1204 11/04/22 1453  WBC 4.1 3.7*  NEUTROABS 3.2  --   HGB 8.8 Repeated and verified X2.* 6.5*  HCT 27.7* 21.9*  MCV 84.3 89.4  PLT 203.0 187   Basic Metabolic Panel: Recent Labs  Lab 10/31/22 1204 11/04/22 1453  NA 139 139   K 5.0 3.8  CL 100 100  CO2 31 28  GLUCOSE 87 135*  BUN 42* 48*  CREATININE 1.57* 1.62*  CALCIUM 9.6 9.2   GFR: Estimated Creatinine Clearance: 23.8 mL/min (A) (by C-G formula based on SCr of 1.62 mg/dL (H)). Liver Function Tests: Recent Labs  Lab 10/31/22 1204  AST 15  ALT 7  ALKPHOS 51  BILITOT 0.5  PROT 6.7  ALBUMIN 4.2   No results for input(s): "LIPASE", "AMYLASE" in the last 168 hours. No results for input(s): "AMMONIA" in the last 168 hours. Coagulation Profile: No results for input(s): "INR", "PROTIME" in the last 168 hours. Cardiac Enzymes: No results for input(s): "CKTOTAL", "CKMB", "CKMBINDEX", "TROPONINI" in the last 168 hours. BNP (last 3 results) No results for input(s): "PROBNP" in the last 8760 hours. HbA1C: No results for input(s): "HGBA1C" in the last 72 hours. CBG: No results for input(s): "GLUCAP" in the last 168 hours. Lipid Profile: No results for input(s): "CHOL", "HDL", "LDLCALC", "TRIG", "CHOLHDL", "LDLDIRECT" in the last 72 hours. Thyroid Function Tests: No results for input(s): "TSH", "T4TOTAL", "FREET4", "T3FREE", "THYROIDAB" in the last 72 hours. Anemia Panel: No results for input(s): "VITAMINB12", "FOLATE", "FERRITIN", "TIBC", "IRON", "RETICCTPCT" in the last 72 hours. Sepsis Labs: No results for input(s): "PROCALCITON", "LATICACIDVEN" in the last 168 hours.  No results found for this or any previous visit (from the past 240 hour(s)).       Radiology Studies: CT Angio Abd/Pel W and/or Wo Contrast  Result Date: 11/04/2022 CLINICAL DATA:  Lower GI bleed. EXAM: CTA ABDOMEN AND PELVIS WITHOUT AND WITH CONTRAST TECHNIQUE: Multidetector CT imaging of the abdomen and pelvis was performed using the standard protocol during bolus administration of intravenous contrast. Multiplanar reconstructed images and MIPs were obtained and reviewed to evaluate the vascular anatomy. RADIATION DOSE REDUCTION: This exam was performed according to the  departmental dose-optimization program which includes automated exposure control, adjustment of the mA and/or kV according to patient size and/or use of iterative reconstruction technique. CONTRAST:  55mL OMNIPAQUE IOHEXOL 350 MG/ML SOLN COMPARISON:  CT abdomen pelvis dated 02/16/2022 and PET CT dated 04/10/2022. FINDINGS: VASCULAR Aorta: Advanced atherosclerotic calcification of the abdominal aorta. No aneurysmal dilatation or dissection. No periaortic fluid collection. Celiac: Atherosclerotic calcification of the origin of the celiac artery. The celiac trunk and its major branches are patent. There is an accessory left hepatic artery from the left gastric artery. SMA: The SMA is patent. Renals: Atherosclerotic calcification of the origin of the left renal artery. The left renal artery is patent. IMA: The IMA is patent. Inflow: Bilateral common iliac artery stents appear patent. There is moderate atherosclerotic calcification of the iliac arteries. No aneurysmal dilatation or dissection. The iliac arteries remain patent. Proximal Outflow: The visualized proximal outflow is patent. Veins: The IVC is unremarkable.  No portal venous gas. Review of the MIP images confirms the above findings. NON-VASCULAR Lower chest: Partially visualized small bilateral pleural effusions,  right greater than left. Cardiac AICD device noted. No intra-abdominal free air or free fluid. Hepatobiliary: There is irregularity of the liver contour consistent with changes of cirrhosis, possibly related to passive congestion and cardiac dysfunction. A 1 cm enhancing focus in the posteroinferior right lobe of the liver (46/6) is not characterized, but may represent a flash filling hemangioma or portal venous shunting. Other etiologies are not excluded. This is similar to the prior CT. Indeterminate 13 mm lesion in the left lobe of the liver demonstrates similar enhancement to the background liver on arterial phase and early washout. Further  characterization with MRI without and with contrast recommended. There is mild biliary dilatation, post cholecystectomy. Pancreas: A 4.5 x 4.6 cm enhancing mass in the uncinate process of the pancreas slightly increased since the prior CT. Additional enhancing lesions in the distal body/tail of the pancreas measure up to 11 mm. No dilatation of the main pancreatic duct or gland atrophy. Spleen: Normal in size without focal abnormality. Adrenals/Urinary Tract: Status post prior right nephrectomy. The right adrenal gland is not visualized and may be surgically absent. There is no hydronephrosis or nephrolithiasis on the left. The left ureter is unremarkable. The urinary bladder is minimally distended and suboptimally visualized due to streak artifact caused by bilateral total hip arthroplasties. Stomach/Bowel: There is no bowel obstruction or active inflammation. Moderate stool throughout the colon. Scattered colonic diverticula without active inflammatory changes. No evidence of active GI bleed. The appendix is not visualized with certainty. No inflammatory changes identified in the right lower quadrant. Lymphatic: No adenopathy. Reproductive: The uterus is not visualized. Other: None Musculoskeletal: Osteopenia with degenerative changes of the spine. Bilateral total hip arthroplasties. Old L2 compression fracture with anterior wedging. No acute osseous pathology. IMPRESSION: 1. No evidence of active GI bleed. 2. Colonic diverticulosis. No bowel obstruction. 3. Cirrhosis with indeterminate liver lesions. Further characterization with MRI without and with contrast recommended. 4. Enhancing pancreatic masses, slightly increased since the prior CT. 5.  Aortic Atherosclerosis (ICD10-I70.0). Electronically Signed   By: Elgie Collard M.D.   On: 11/04/2022 20:17        Scheduled Meds:  sodium chloride   Intravenous Once   acetaminophen  650 mg Oral Once   diphenhydrAMINE  50 mg Oral Once   methylPREDNISolone  (SOLU-MEDROL) injection  40 mg Intravenous Once   pantoprazole (PROTONIX) IV  40 mg Intravenous BID   Continuous Infusions:   LOS: 1 day    Time spent: 45 minutes spent on chart review, discussion with nursing staff, consultants, updating family and interview/physical exam; more than 50% of that time was spent in counseling and/or coordination of care.    Joseph Art, DO [facility] Available via Epic secure chat 7am-7pm After these hours, please refer to coverage provider listed on [email or web address] 11/05/2022, 9:33 AM

## 2022-11-06 ENCOUNTER — Inpatient Hospital Stay (HOSPITAL_COMMUNITY): Payer: PPO

## 2022-11-06 ENCOUNTER — Encounter (HOSPITAL_COMMUNITY)
Admission: EM | Disposition: A | Discharge status: Home / Self Care | Payer: Self-pay | Source: Home / Self Care | Attending: Internal Medicine

## 2022-11-06 ENCOUNTER — Encounter (HOSPITAL_COMMUNITY): Payer: Self-pay | Admitting: Internal Medicine

## 2022-11-06 DIAGNOSIS — N1832 Chronic kidney disease, stage 3b: Secondary | ICD-10-CM

## 2022-11-06 DIAGNOSIS — K921 Melena: Secondary | ICD-10-CM | POA: Diagnosis not present

## 2022-11-06 DIAGNOSIS — D500 Iron deficiency anemia secondary to blood loss (chronic): Secondary | ICD-10-CM

## 2022-11-06 DIAGNOSIS — K5520 Angiodysplasia of colon without hemorrhage: Secondary | ICD-10-CM

## 2022-11-06 DIAGNOSIS — I5032 Chronic diastolic (congestive) heart failure: Secondary | ICD-10-CM

## 2022-11-06 DIAGNOSIS — Z87891 Personal history of nicotine dependence: Secondary | ICD-10-CM

## 2022-11-06 DIAGNOSIS — K31819 Angiodysplasia of stomach and duodenum without bleeding: Secondary | ICD-10-CM | POA: Diagnosis not present

## 2022-11-06 DIAGNOSIS — D649 Anemia, unspecified: Secondary | ICD-10-CM | POA: Diagnosis not present

## 2022-11-06 DIAGNOSIS — K209 Esophagitis, unspecified without bleeding: Secondary | ICD-10-CM | POA: Diagnosis not present

## 2022-11-06 DIAGNOSIS — K449 Diaphragmatic hernia without obstruction or gangrene: Secondary | ICD-10-CM

## 2022-11-06 DIAGNOSIS — I130 Hypertensive heart and chronic kidney disease with heart failure and stage 1 through stage 4 chronic kidney disease, or unspecified chronic kidney disease: Secondary | ICD-10-CM

## 2022-11-06 DIAGNOSIS — D3A8 Other benign neuroendocrine tumors: Secondary | ICD-10-CM | POA: Diagnosis not present

## 2022-11-06 DIAGNOSIS — K2289 Other specified disease of esophagus: Secondary | ICD-10-CM

## 2022-11-06 HISTORY — PX: SUBMUCOSAL TATTOO INJECTION: SHX6856

## 2022-11-06 HISTORY — PX: ENTEROSCOPY: SHX5533

## 2022-11-06 HISTORY — PX: HOT HEMOSTASIS: SHX5433

## 2022-11-06 LAB — BPAM RBC
Blood Product Expiration Date: 202408042359
Blood Product Expiration Date: 202408062359
ISSUE DATE / TIME: 202407021450
ISSUE DATE / TIME: 202407021810
Unit Type and Rh: 600
Unit Type and Rh: 600

## 2022-11-06 LAB — CBC
HCT: 30.4 % — ABNORMAL LOW (ref 36.0–46.0)
Hemoglobin: 9.2 g/dL — ABNORMAL LOW (ref 12.0–15.0)
MCH: 25.8 pg — ABNORMAL LOW (ref 26.0–34.0)
MCHC: 30.3 g/dL (ref 30.0–36.0)
MCV: 85.2 fL (ref 80.0–100.0)
Platelets: 189 10*3/uL (ref 150–400)
RBC: 3.57 MIL/uL — ABNORMAL LOW (ref 3.87–5.11)
RDW: 21.9 % — ABNORMAL HIGH (ref 11.5–15.5)
WBC: 7.6 10*3/uL (ref 4.0–10.5)
nRBC: 0 % (ref 0.0–0.2)

## 2022-11-06 LAB — TYPE AND SCREEN
ABO/RH(D): A NEG
Antibody Screen: POSITIVE
PT AG Type: NEGATIVE
Unit division: 0

## 2022-11-06 LAB — BASIC METABOLIC PANEL
Anion gap: 11 (ref 5–15)
BUN: 33 mg/dL — ABNORMAL HIGH (ref 8–23)
CO2: 24 mmol/L (ref 22–32)
Calcium: 9.2 mg/dL (ref 8.9–10.3)
Chloride: 99 mmol/L (ref 98–111)
Creatinine, Ser: 1.4 mg/dL — ABNORMAL HIGH (ref 0.44–1.00)
GFR, Estimated: 37 mL/min — ABNORMAL LOW (ref >60)
Glucose, Bld: 135 mg/dL — ABNORMAL HIGH (ref 70–99)
Potassium: 4.1 mmol/L (ref 3.5–5.1)
Sodium: 134 mmol/L — ABNORMAL LOW (ref 135–145)

## 2022-11-06 SURGERY — ENTEROSCOPY
Anesthesia: Monitor Anesthesia Care

## 2022-11-06 MED ORDER — PANTOPRAZOLE SODIUM 40 MG PO TBEC
40.0000 mg | DELAYED_RELEASE_TABLET | Freq: Two times a day (BID) | ORAL | Status: DC
  Administered 2022-11-06 – 2022-11-09 (×6): 40 mg via ORAL
  Filled 2022-11-06 (×6): qty 1

## 2022-11-06 MED ORDER — SPOT INK MARKER SYRINGE KIT
PACK | SUBMUCOSAL | Status: DC | PRN
  Administered 2022-11-06: 1 mL via SUBMUCOSAL

## 2022-11-06 MED ORDER — PROPOFOL 10 MG/ML IV BOLUS
INTRAVENOUS | Status: DC | PRN
  Administered 2022-11-06 (×2): 10 mg via INTRAVENOUS
  Administered 2022-11-06: 20 mg via INTRAVENOUS
  Administered 2022-11-06 (×2): 30 mg via INTRAVENOUS
  Administered 2022-11-06: 20 mg via INTRAVENOUS
  Administered 2022-11-06: 10 mg via INTRAVENOUS
  Administered 2022-11-06 (×2): 20 mg via INTRAVENOUS
  Administered 2022-11-06 (×2): 10 mg via INTRAVENOUS

## 2022-11-06 MED ORDER — LIDOCAINE 2% (20 MG/ML) 5 ML SYRINGE
INTRAMUSCULAR | Status: DC | PRN
  Administered 2022-11-06: 40 mg via INTRAVENOUS

## 2022-11-06 MED ORDER — SODIUM CHLORIDE 0.9 % IV SOLN: INTRAVENOUS | Status: DC

## 2022-11-06 MED ORDER — SODIUM CHLORIDE 0.9 % IV SOLN
250.0000 mg | Freq: Every day | INTRAVENOUS | Status: AC
  Administered 2022-11-06 – 2022-11-07 (×2): 250 mg via INTRAVENOUS
  Filled 2022-11-06 (×2): qty 20

## 2022-11-06 MED ORDER — SPOT INK MARKER SYRINGE KIT
PACK | SUBMUCOSAL | Status: AC
  Filled 2022-11-06: qty 5

## 2022-11-06 MED ORDER — SODIUM CHLORIDE 0.9 % IV SOLN: 250.0000 mg | Freq: Every day | INTRAVENOUS | Status: DC

## 2022-11-06 MED ORDER — PHENYLEPHRINE HCL (PRESSORS) 10 MG/ML IV SOLN
INTRAVENOUS | Status: DC | PRN
  Administered 2022-11-06: 160 ug via INTRAVENOUS

## 2022-11-06 MED ORDER — LACTATED RINGERS IV SOLN: INTRAVENOUS | Status: DC

## 2022-11-06 NOTE — Plan of Care (Signed)
  Problem: Education: Goal: Knowledge of General Education information will improve Description Including pain rating scale, medication(s)/side effects and non-pharmacologic comfort measures Outcome: Progressing   Problem: Clinical Measurements: Goal: Will remain free from infection Outcome: Progressing   Problem: Nutrition: Goal: Adequate nutrition will be maintained Outcome: Progressing   Problem: Elimination: Goal: Will not experience complications related to bowel motility Outcome: Progressing Goal: Will not experience complications related to urinary retention Outcome: Progressing   Problem: Safety: Goal: Ability to remain free from injury will improve Outcome: Progressing   

## 2022-11-06 NOTE — Anesthesia Postprocedure Evaluation (Signed)
Anesthesia Post Note  Patient: [name]  Procedure(s) Performed: ENTEROSCOPY HOT HEMOSTASIS (ARGON PLASMA COAGULATION/BICAP) SUBMUCOSAL TATTOO INJECTION     Patient location during evaluation: PACU Anesthesia Type: MAC Level of consciousness: awake and alert Pain management: pain level controlled Vital Signs Assessment: post-procedure vital signs reviewed and stable Respiratory status: spontaneous breathing, nonlabored ventilation and respiratory function stable Cardiovascular status: blood pressure returned to baseline Postop Assessment: no apparent nausea or vomiting Anesthetic complications: no   No notable events documented.  Last Vitals:  Vitals:   11/06/22 1533 11/06/22 1538  BP: (!) 165/54 (!) 123/57  Pulse: 60 62  Resp: (!) 21 (!) 25  Temp:    SpO2: 100% 97%    Last Pain:  Vitals:   11/06/22 1538  TempSrc:   PainSc: 0-No pain                 Shanda Howells

## 2022-11-06 NOTE — Evaluation (Signed)
Physical Therapy Evaluation Patient Details Name: [name] MRN: [id number] DOB: 01/12/1934 Today's Date: 11/06/2022  History of Present Illness  Patient is a 89 year old female admitted 7/1 who presented with generalized fatigue.  Associated with exertional shortness of breath. Pt with symptomatic anemia.  Did receive blood.  Also with gastritis with procedures planned.  PMH: HCOM, NICM, permanent A-fib on Eliquis, chronic hypoxic respiratory failure on 2 L O2 via [place] continuously, carotid artery stenosis, PAD, HTN, HLP, chronic HFpEF, hypothyroidism, CKD 3B, recent herpes zoster infection, history of blood transfusion reaction, renal cell carcinoma status post nephrectomy in 2003, history of angiodysplasia of the stomach and duodenum/gastritis on daily PPI  Clinical Impression  Pt admitted with above diagnosis. Pt was able to ambulate with min guard to supervision assist with overall good safety awareness.  Pt should progress well.   Pt currently with functional limitations due to the deficits listed below (see PT Problem List). Pt will benefit from acute skilled PT to increase their independence and safety with mobility to allow discharge.           Assistance Recommended at Discharge Intermittent Supervision/Assistance  If plan is discharge home, recommend the following:  Can travel by private vehicle  Assistance with cooking/housework        Equipment Recommendations None recommended by PT  Recommendations for Other Services       Functional Status Assessment Patient has had a recent decline in their functional status and demonstrates the ability to make significant improvements in function in a reasonable and predictable amount of time.     Precautions / Restrictions Precautions Precautions: None Restrictions Weight Bearing Restrictions: No      Mobility  Bed Mobility Overal bed mobility: Independent                  Transfers Overall transfer level: Needs  assistance Equipment used: Rolling walker (2 wheels) Transfers: Sit to/from Stand Sit to Stand: Supervision                Ambulation/Gait Ambulation/Gait assistance: Supervision Gait Distance (Feet): 65 Feet Assistive device: Rolling walker (2 wheels) Gait Pattern/deviations: Step-through pattern, Decreased stride length   Gait velocity interpretation: <1.31 ft/sec, indicative of household ambulator   General Gait Details: Pt was able to ambulate with RW in room with overall good safety awareness.  Made laps in room as pt stated she cannot wear socks or shoes.  Stairs            Wheelchair Mobility     Tilt Bed    Modified Rankin (Stroke Patients Only)       Balance Overall balance assessment: Needs assistance Sitting-balance support: No upper extremity supported, Feet supported Sitting balance-Leahy Scale: Fair     Standing balance support: Bilateral upper extremity supported, During functional activity Standing balance-Leahy Scale: Poor Standing balance comment: relies on UE support                             Pertinent Vitals/Pain Pain Assessment Pain Assessment: No/denies pain    Home Living Family/patient expects to be discharged to:: Private residence Living Arrangements: Other relatives;Other (Comment) (lives with great grandson) Available Help at Discharge: Family;Available PRN/intermittently Type of Home: House Home Access: Stairs to enter Entrance Stairs-Rails: Right;Left;Can reach both Entrance Stairs-Number of Steps: few   Home Layout: One level Home Equipment: Pharmacist, hospital (2 wheels);Cane - single point;Grab bars - tub/shower;BSC/3in1 (2LO2 at home) Additional  Comments: great grandson is a Scientist, research (life sciences) Prior Level of Function : Independent/Modified Independent               ADLs Comments: independent ADLs, IADLs, driving     Hand Dominance   Dominant Hand: Right    Extremity/Trunk  Assessment   Upper Extremity Assessment Upper Extremity Assessment: Defer to OT evaluation    Lower Extremity Assessment Lower Extremity Assessment: Generalized weakness    Cervical / Trunk Assessment Cervical / Trunk Assessment: Normal  Communication   Communication: No difficulties  Cognition Arousal/Alertness: Awake/alert Behavior During Therapy: WFL for tasks assessed/performed Overall Cognitive Status: Within Functional Limits for tasks assessed                                          General Comments General comments (skin integrity, edema, etc.): 60 bpm, 100% 2L O2    Exercises     Assessment/Plan    PT Assessment Patient needs continued PT services  PT Problem List Decreased activity tolerance;Decreased balance;Decreased mobility;Decreased knowledge of use of DME;Decreased safety awareness;Decreased knowledge of precautions;Cardiopulmonary status limiting activity       PT Treatment Interventions DME instruction;Gait training;Functional mobility training;Therapeutic activities;Therapeutic exercise;Balance training;Patient/family education    PT Goals (Current goals can be found in the Care Plan section)  Acute Rehab PT Goals Patient Stated Goal: to go home PT Goal Formulation: With patient Time For Goal Achievement: 11/20/22 Potential to Achieve Goals: Good    Frequency Min 3X/week     Co-evaluation               AM-PAC PT "6 Clicks" Mobility  Outcome Measure Help needed turning from your back to your side while in a flat bed without using bedrails?: None Help needed moving from lying on your back to sitting on the side of a flat bed without using bedrails?: None Help needed moving to and from a bed to a chair (including a wheelchair)?: A Little Help needed standing up from a chair using your arms (e.g., wheelchair or bedside chair)?: A Little Help needed to walk in hospital room?: A Little Help needed climbing 3-5 steps with a  railing? : A Little 6 Click Score: 20    End of Session Equipment Utilized During Treatment: Gait belt;Oxygen Activity Tolerance: Patient tolerated treatment well Patient left: in bed;with call bell/phone within reach Nurse Communication: Mobility status PT Visit Diagnosis: Muscle weakness (generalized) (M62.81)    Time: 5573-2202 PT Time Calculation (min) (ACUTE ONLY): 18 min   Charges:   PT Evaluation $PT Eval Moderate Complexity: 1 Mod   PT General Charges $$ ACUTE PT VISIT: 1 Visit         Hinton Luellen M,PT Acute Rehab Services [phone]   Bevelyn Buckles 11/06/2022, 11:40 AM

## 2022-11-06 NOTE — Progress Notes (Signed)
OT Cancellation Note  Patient Details Name: [name] MRN: [id number] DOB: [birth year]   Cancelled Treatment:    Reason Eval/Treat Not Completed: Patient at procedure or test/ unavailable, enteroscopy.   Evern Bio 11/06/2022, 2:10 PM Berna Spare, OTR/L Acute Rehabilitation Services Office: [phone]

## 2022-11-06 NOTE — Transfer of Care (Signed)
Immediate Anesthesia Transfer of Care Note  Patient: [name]  Procedure(s) Performed: ENTEROSCOPY HOT HEMOSTASIS (ARGON PLASMA COAGULATION/BICAP) SUBMUCOSAL TATTOO INJECTION  Patient Location: PACU  Anesthesia Type:MAC  Level of Consciousness: awake and alert   Airway & Oxygen Therapy: Patient Spontanous Breathing and Patient connected to nasal cannula oxygen  Post-op Assessment: Report given to RN and Post -op Vital signs reviewed and stable  Post vital signs: Reviewed and stable  Last Vitals:  Vitals Value Taken Time  BP 119/47 11/06/22 1518  Temp 36.2 C 11/06/22 1518  Pulse 62 11/06/22 1520  Resp 23 11/06/22 1520  SpO2 95 % 11/06/22 1520  Vitals shown include unvalidated device data.  Last Pain:  Vitals:   11/06/22 1518  TempSrc: Temporal  PainSc: 0-No pain      Patients Stated Pain Goal: 0 (11/06/22 0358)  Complications: No notable events documented.

## 2022-11-06 NOTE — Interval H&P Note (Signed)
History and Physical Interval Note:  11/06/2022 1:58 PM  [name]  has presented today for surgery, with the diagnosis of history of avms small bowel , anemia.  The various methods of treatment have been discussed with the patient and family. After consideration of risks, benefits and other options for treatment, the patient has consented to  Procedure(s): ENTEROSCOPY (N/A) as a surgical intervention.  The patient's history has been reviewed, patient examined, no change in status, stable for surgery.  I have reviewed the patient's chart and labs.  Questions were answered to the patient's satisfaction.     [employer]

## 2022-11-06 NOTE — Anesthesia Preprocedure Evaluation (Addendum)
Anesthesia Evaluation  Patient identified by MRN, date of birth, ID band Patient awake    Reviewed: Allergy & Precautions, NPO status , Patient's Chart, lab work & pertinent test results  History of Anesthesia Complications (+) PONV and history of anesthetic complications  Airway Mallampati: II  TM Distance: >3 FB Neck ROM: Full    Dental  (+) Missing, Partial Upper,    Pulmonary COPD,  oxygen dependent, former smoker   Pulmonary exam normal        Cardiovascular hypertension, Pt. on medications pulmonary hypertension+ CAD and + Peripheral Vascular Disease  Normal cardiovascular exam+ pacemaker + Cardiac Defibrillator + Valvular Problems/Murmurs    '23 TTE - EF 60 to 65%. There is moderate concentric left ventricular hypertrophy. Grade II diastolic dysfunction (pseudonormalization). There is the interventricular septum is flattened in systole and diastole, consistent with right ventricular pressure and volume overload. The right ventricular size is mildly enlarged. There is severely elevated pulmonary artery systolic pressure. Left atrial size was moderately dilated. Right atrial size was moderately dilated. A small pericardial effusion is present. Large pleural effusion. Mild mitral valve regurgitation. Tricuspid valve regurgitation is moderate to severe. Aortic valve regurgitation is mild. Mild aortic valve stenosis.     Neuro/Psych TIA   GI/Hepatic negative GI ROS, Neg liver ROS,,,  Endo/Other  Hypothyroidism    Renal/GU CRFRenal disease  Female GU complaint     Musculoskeletal  (+) Arthritis ,    Abdominal   Peds  Hematology  (+) Blood dyscrasia, anemia  On eliquis    Anesthesia Other Findings   Reproductive/Obstetrics                             Anesthesia Physical Anesthesia Plan  ASA: 4  Anesthesia Plan: MAC   Post-op Pain Management: Minimal or no pain anticipated    Induction:   PONV Risk Score and Plan: 3 and Propofol infusion and Treatment may vary due to age or medical condition  Airway Management Planned: Natural Airway and Nasal Cannula  Additional Equipment: None  Intra-op Plan:   Post-operative Plan:   Informed Consent: I have reviewed the patients History and Physical, chart, labs and discussed the procedure including the risks, benefits and alternatives for the proposed anesthesia with the patient or authorized representative who has indicated his/her understanding and acceptance.   Patient has DNR.  Discussed DNR with patient and Continue DNR.     Plan Discussed with: CRNA and Anesthesiologist  Anesthesia Plan Comments:        Anesthesia Quick Evaluation

## 2022-11-06 NOTE — Progress Notes (Signed)
Patient advised that she did not want to have PIV inserted at this time.  Patient verbalized understanding that might need to have PIV inserted at later time if needed.

## 2022-11-06 NOTE — Op Note (Addendum)
[facility] Patient Name: [name] Procedure Date : 11/06/2022 MRN: [id number] Attending MD: Corliss Parish , MD, [id number] Date of Birth: [birth year] CSN: [id number] Age: 89 Admit Type: Inpatient Procedure:                Small bowel enteroscopy Indications:              Melena, Gastrointestinal occult blood loss,                            Exclusion of angioectasia Providers:                Corliss Parish, MD, Stephens Shire RN, RN, Eliberto Ivory, RN, Marja Kays, Technician Referring MD:              Medicines:                Monitored Anesthesia Care Complications:            No immediate complications. Estimated Blood Loss:     Estimated blood loss was minimal. Procedure:                Pre-Anesthesia Assessment:                           - Prior to the procedure, a History and Physical                            was performed, and patient medications and                            allergies were reviewed. The patient's tolerance of                            previous anesthesia was also reviewed. The risks                            and benefits of the procedure and the sedation                            options and risks were discussed with the patient.                            All questions were answered, and informed consent                            was obtained. Prior Anticoagulants: The patient has                            taken Eliquis (apixaban), last dose was 2 days                            prior to procedure. ASA Grade Assessment: III - A  patient with severe systemic disease. After                            reviewing the risks and benefits, the patient was                            deemed in satisfactory condition to undergo the                            procedure.                           After obtaining informed consent, the endoscope was                            passed  under direct vision. Throughout the                            procedure, the patient's blood pressure, pulse, and                            oxygen saturations were monitored continuously. The                            PCF-190TL ([id number] Olympus colonoscope was                            introduced through the mouth and advanced to the                            proximal jejunum. The small bowel enteroscopy was                            accomplished without difficulty. The patient                            tolerated the procedure. Scope In: Scope Out: Findings:      No gross lesions were noted in the majority of the esophagus.      LA Grade A (one or more mucosal breaks less than 5 mm, not extending       between tops of 2 mucosal folds) esophagitis with no bleeding was found       at the very distal esophagus and at the gastroesophageal junction.      The Z-line was irregular and was found 40 cm from the incisors.      A 2 cm hiatal hernia was present.      No gross lesions were noted in the entire examined stomach.      Two angiodysplastic lesions with no bleeding were found in the third       portion of the duodenum and in the fourth portion of the duodenum.       Fulguration to ablate the lesion to prevent bleeding by argon plasma was       successful.      Normal mucosa was found in the entire duodenum otherwise.      Three angiodysplastic lesions with  no bleeding were found in the       proximal jejunum. Fulguration to ablate the lesion to prevent bleeding       by argon plasma was successful.      Normal mucosa was found in the visualized proximal jejunum. Area was       tattooed with an injection of Spot (carbon black) to demarcate distal       extent of today's procedure.. Impression:               - No gross lesions in the majority of the esophagus.                           - LA Grade A esophagitis with no bleeding found in                            the very distal  esophagus.                           - Z-line irregular, 40 cm from the incisors.                           - 2 cm hiatal hernia.                           - No gross lesions in the entire stomach.                           - Two non-bleeding angiodysplastic lesions in the                            duodenum. Treated with argon plasma coagulation                            (APC).                           - Normal mucosa was found in the entire examined                            duodenum.                           - Three non-bleeding angiodysplastic lesions in the                            jejunum. Treated with argon plasma coagulation                            (APC).                           - Normal mucosa was found in the jejunum. Tattooed.                           - DRE today shows brown stool in the rectal vault. Recommendation:           -  The patient will be observed post-procedure,                            until all discharge criteria are met.                           - Return patient to hospital ward for ongoing care.                           - May discontinue IV PPI and transition to PO PPI                            40 mg QD.                           - Recommend patient receive IV iron while in-house.                           - Hold restart of Eliquis for 72 hours (may restart                            on 7/6) to decrease post interventional bleeding                            risk.                           - Recommend patient receive IV iron as outpatient                            with hematology.                           - In 1 week restart oral iron ferrous gluconate or                            ferrous sulfate once daily.                           - If patient has recurrent iron deficiency and                            anemia, then she will need a colonoscopy +/- video                            capsule endoscopy to be strongly considered.                            - The findings and recommendations were discussed                            with the patient.                           - The findings and recommendations  were discussed                            with the patient's family.                           - The findings and recommendations were discussed                            with the referring physician. Procedure Code(s):        --- Professional ---                           [phone], Small intestinal endoscopy, enteroscopy                            beyond second portion of duodenum, not including                            ileum; with control of bleeding (eg, injection,                            bipolar cautery, unipolar cautery, laser, heater                            probe, stapler, plasma coagulator)                           44799, Unlisted procedure, small intestine Diagnosis Code(s):        --- Professional ---                           K20.90, Esophagitis, unspecified without bleeding                           K22.89, Other specified disease of esophagus                           K44.9, Diaphragmatic hernia without obstruction or                            gangrene                           K31.819, Angiodysplasia of stomach and duodenum                            without bleeding                           K55.20, Angiodysplasia of colon without hemorrhage                           K92.1, Melena (includes Hematochezia)                           R19.5, Other fecal abnormalities CPT copyright 2022 [employer]. All rights reserved.  The codes documented in this report are preliminary and upon coder review may  be revised to meet current compliance requirements. Corliss Parish, MD 11/06/2022 3:26:09 PM Number of Addenda: 0

## 2022-11-06 NOTE — Progress Notes (Signed)
[facility]                                                                              [name], is a 89 y.o. female, DOB - Jul 02, 1933, EAV:[id number] Admit date - 11/04/2022    Outpatient Primary MD for the patient is [name] Boyden, MD  LOS - 2  days  Chief Complaint  Patient presents with   Low Hgb   Weakness       Brief summary   Patient is a 89 year old female with HCOM, NICM, permanent A-fib on Eliquis, chronic hypoxic respiratory failure on 2 L O2 via [place] continuously, carotid artery stenosis, PAD, HTN, HLP, chronic HFpEF, hypothyroidism, CKD 3B, recent herpes zoster infection, history of blood transfusion reaction, renal cell carcinoma status post nephrectomy in 2003, history of angiodysplasia of the stomach and duodenum/gastritis on daily PPI, who presented with generalized fatigue.  Associated with exertional shortness of breath.  On Thursday, 5 days prior to admission, patient was seen by her PCP.  Blood work showed acute blood loss anemia with hemoglobin of 8, baseline 10.  Patient was recommended to go to ED.   Endorses melanic stools x 2 weeks.  Intermittent nausea with no vomiting.  No significant abdominal pain.  Also endorses lightheadedness upon standing.  She is compliant with her home Eliquis, last dose was taken this morning.  She decided to come to the ED for further evaluation.   Assessment & Plan    Acute blood loss anemia, Symptomatic anemia - Presented with melena x 2 weeks, exertional dyspnea, generalized fatigue, drop in hemoglobin. Hemoglobin 6.8 on presentation - s/p 2 units pRBC's (history of transfusion reaction in the past) -CTA abdomen pelvis negative for active GI bleed, cirrhosis with indeterminate liver lesions, recommended MRI. -Hemoglobin 9.2 today, GI following plan for small bowel enteroscopy today.   History of angiodysplasia of the stomach and duodenum History of gastritis -Continue IV Protonix 40 mg twice  daily -Plan for small bowel enteroscopy today.  GI following, if no etiology on endoscopy, will need to consider colonoscopy.   History of pancreatic cancer -Seen by Dr. Mosetta Putt outpatient. Per the patient, she missed 3 scheduled therapies due to herpes zoster infection, it has now resolved. -Dr. Mosetta Putt notified by admitting physician.   Permanent atrial fibrillation on Eliquis Currently rate controlled. -Eliquis on hold due to suspected upper GI bleed -Restart Eliquis when cleared by GI. -Continue GI prophylaxis while on DOAC    CKD 3B -Appears to be close to her baseline, creatinine 1.4-1.6 -Continue to avoid nephrotoxic agents and hypotension.   Mild leukopenia -Resolved   Generalized weakness, suspect contributed by severe anemia PT OT assessment Fall precautions  Hypertension, chronic HF pEF -BP somewhat elevated, add hydralazine IV as needed with parameters -2D echo 02/2022 had shown EF 60 to 65%, G2 DD -Torsemide currently held, n.p.o.  Chronic respiratory failure with hypoxia on 2 L O2 -Continue O2, at baseline  HLP -Resume Crestor once back on diet  Estimated body mass index is 21.91 kg/m as calculated from the following:   Height as of this encounter: 5' 6.5" (1.689 m).   Weight  as of this encounter: 62.5 kg.  Code Status: DNR DVT Prophylaxis:  SCDs Start: 11/04/22 2044   Level of Care: Level of care: Progressive Family Communication: Updated patient Disposition Plan:      Remains inpatient appropriate: Workup in progress   Procedures:    Consultants:   Gastroenterology  Antimicrobials: None   Medications  sodium chloride   Intravenous Once   pantoprazole (PROTONIX) IV  40 mg Intravenous BID      Subjective:   [name] was seen and examined today.  No acute complaints.  Awaiting endoscopy today.  Currently no chest pain, shortness of breath, dizziness.  No acute events overnight.  Objective:   Vitals:   11/05/22 2200 11/05/22 2335  11/06/22 0331 11/06/22 0741  BP:  (!) 172/75 (!) 160/55 (!) 141/62  Pulse: 63 63 60 60  Resp: (!) 22 (!) 21 20 20   Temp:  97.6 F (36.4 C) 97.8 F (36.6 C) 97.8 F (36.6 C)  TempSrc:  Oral Oral Oral  SpO2: 97% 99% 99% 99%  Weight:      Height:        Intake/Output Summary (Last 24 hours) at 11/06/2022 1019 Last data filed at 11/05/2022 2200 Gross per 24 hour  Intake 1469 ml  Output --  Net 1469 ml     Wt Readings from Last 3 Encounters:  11/04/22 62.5 kg  10/31/22 62.6 kg  09/16/22 60.1 kg     Exam General: Alert and oriented x 3, NAD Cardiovascular: S1 S2 auscultated,  RRR Respiratory: Diminished breath sounds at the bases, no wheezing Gastrointestinal: Soft, nontender, nondistended, + bowel sounds Ext: trace pedal edema bilaterally Neuro: no new FND's Psych: Normal affect     Data Reviewed:  I have personally reviewed following labs    CBC Lab Results  Component Value Date   WBC 7.6 11/06/2022   RBC 3.57 (L) 11/06/2022   HGB 9.2 (L) 11/06/2022   HCT 30.4 (L) 11/06/2022   MCV 85.2 11/06/2022   MCH 25.8 (L) 11/06/2022   PLT 189 11/06/2022   MCHC 30.3 11/06/2022   RDW 21.9 (H) 11/06/2022   LYMPHSABS 0.4 (L) 10/31/2022   MONOABS 0.5 10/31/2022   EOSABS 0.0 10/31/2022   BASOSABS 0.0 10/31/2022     Last metabolic panel Lab Results  Component Value Date   NA 134 (L) 11/06/2022   K 4.1 11/06/2022   CL 99 11/06/2022   CO2 24 11/06/2022   BUN 33 (H) 11/06/2022   CREATININE 1.40 (H) 11/06/2022   GLUCOSE 135 (H) 11/06/2022   GFRNONAA 37 (L) 11/06/2022   GFRAA 39 (L) 12/23/2019   CALCIUM 9.2 11/06/2022   PHOS 2.8 02/19/2022   PROT 6.7 10/31/2022   ALBUMIN 4.2 10/31/2022   LABGLOB 2.6 12/23/2019   BILITOT 0.5 10/31/2022   ALKPHOS 51 10/31/2022   AST 15 10/31/2022   ALT 7 10/31/2022   ANIONGAP 11 11/06/2022    CBG (last 3)  No results for input(s): "GLUCAP" in the last 72 hours.    Coagulation Profile: No results for input(s): "INR",  "PROTIME" in the last 168 hours.   Radiology Studies: I have personally reviewed the imaging studies  CT Angio Abd/Pel W and/or Wo Contrast  Result Date: 11/04/2022 CLINICAL DATA:  Lower GI bleed. EXAM: CTA ABDOMEN AND PELVIS WITHOUT AND WITH CONTRAST TECHNIQUE: Multidetector CT imaging of the abdomen and pelvis was performed using the standard protocol during bolus administration of intravenous contrast. Multiplanar reconstructed images and MIPs were obtained and  reviewed to evaluate the vascular anatomy. RADIATION DOSE REDUCTION: This exam was performed according to the departmental dose-optimization program which includes automated exposure control, adjustment of the mA and/or kV according to patient size and/or use of iterative reconstruction technique. CONTRAST:  55mL OMNIPAQUE IOHEXOL 350 MG/ML SOLN COMPARISON:  CT abdomen pelvis dated 02/16/2022 and PET CT dated 04/10/2022. FINDINGS: VASCULAR Aorta: Advanced atherosclerotic calcification of the abdominal aorta. No aneurysmal dilatation or dissection. No periaortic fluid collection. Celiac: Atherosclerotic calcification of the origin of the celiac artery. The celiac trunk and its major branches are patent. There is an accessory left hepatic artery from the left gastric artery. SMA: The SMA is patent. Renals: Atherosclerotic calcification of the origin of the left renal artery. The left renal artery is patent. IMA: The IMA is patent. Inflow: Bilateral common iliac artery stents appear patent. There is moderate atherosclerotic calcification of the iliac arteries. No aneurysmal dilatation or dissection. The iliac arteries remain patent. Proximal Outflow: The visualized proximal outflow is patent. Veins: The IVC is unremarkable.  No portal venous gas. Review of the MIP images confirms the above findings. NON-VASCULAR Lower chest: Partially visualized small bilateral pleural effusions, right greater than left. Cardiac AICD device noted. No intra-abdominal free  air or free fluid. Hepatobiliary: There is irregularity of the liver contour consistent with changes of cirrhosis, possibly related to passive congestion and cardiac dysfunction. A 1 cm enhancing focus in the posteroinferior right lobe of the liver (46/6) is not characterized, but may represent a flash filling hemangioma or portal venous shunting. Other etiologies are not excluded. This is similar to the prior CT. Indeterminate 13 mm lesion in the left lobe of the liver demonstrates similar enhancement to the background liver on arterial phase and early washout. Further characterization with MRI without and with contrast recommended. There is mild biliary dilatation, post cholecystectomy. Pancreas: A 4.5 x 4.6 cm enhancing mass in the uncinate process of the pancreas slightly increased since the prior CT. Additional enhancing lesions in the distal body/tail of the pancreas measure up to 11 mm. No dilatation of the main pancreatic duct or gland atrophy. Spleen: Normal in size without focal abnormality. Adrenals/Urinary Tract: Status post prior right nephrectomy. The right adrenal gland is not visualized and may be surgically absent. There is no hydronephrosis or nephrolithiasis on the left. The left ureter is unremarkable. The urinary bladder is minimally distended and suboptimally visualized due to streak artifact caused by bilateral total hip arthroplasties. Stomach/Bowel: There is no bowel obstruction or active inflammation. Moderate stool throughout the colon. Scattered colonic diverticula without active inflammatory changes. No evidence of active GI bleed. The appendix is not visualized with certainty. No inflammatory changes identified in the right lower quadrant. Lymphatic: No adenopathy. Reproductive: The uterus is not visualized. Other: None Musculoskeletal: Osteopenia with degenerative changes of the spine. Bilateral total hip arthroplasties. Old L2 compression fracture with anterior wedging. No acute  osseous pathology. IMPRESSION: 1. No evidence of active GI bleed. 2. Colonic diverticulosis. No bowel obstruction. 3. Cirrhosis with indeterminate liver lesions. Further characterization with MRI without and with contrast recommended. 4. Enhancing pancreatic masses, slightly increased since the prior CT. 5.  Aortic Atherosclerosis (ICD10-I70.0). Electronically Signed   By: Elgie Collard M.D.   On: 11/04/2022 20:17       Haydon Kalmar M.D. [facility] 11/06/2022, 10:19 AM  Available via Epic secure chat 7am-7pm After 7 pm, please refer to night coverage provider listed on amion.

## 2022-11-06 NOTE — Progress Notes (Signed)
Arrived to room. At this time patient decline PIV. RN notified. Nurse VU and reports she will reconsult when PIV needed. Goes to bedside to discuss with patient. Tomasita Morrow, RN VAST

## 2022-11-07 ENCOUNTER — Encounter (HOSPITAL_COMMUNITY): Payer: Self-pay | Admitting: Gastroenterology

## 2022-11-07 ENCOUNTER — Inpatient Hospital Stay (HOSPITAL_COMMUNITY): Payer: PPO

## 2022-11-07 DIAGNOSIS — K921 Melena: Secondary | ICD-10-CM | POA: Diagnosis not present

## 2022-11-07 DIAGNOSIS — D500 Iron deficiency anemia secondary to blood loss (chronic): Secondary | ICD-10-CM | POA: Diagnosis not present

## 2022-11-07 DIAGNOSIS — D649 Anemia, unspecified: Secondary | ICD-10-CM | POA: Diagnosis not present

## 2022-11-07 DIAGNOSIS — D3A8 Other benign neuroendocrine tumors: Secondary | ICD-10-CM | POA: Diagnosis not present

## 2022-11-07 LAB — HEMOGLOBIN AND HEMATOCRIT, BLOOD
HCT: 26.7 % — ABNORMAL LOW (ref 36.0–46.0)
Hemoglobin: 8 g/dL — ABNORMAL LOW (ref 12.0–15.0)

## 2022-11-07 MED ORDER — TORSEMIDE 20 MG PO TABS: 80.0000 mg | ORAL_TABLET | Freq: Two times a day (BID) | ORAL | Status: DC

## 2022-11-07 MED ORDER — ROSUVASTATIN CALCIUM 20 MG PO TABS
40.0000 mg | ORAL_TABLET | Freq: Every evening | ORAL | Status: DC
  Administered 2022-11-07 – 2022-11-08 (×2): 40 mg via ORAL
  Filled 2022-11-07 (×2): qty 2

## 2022-11-07 MED ORDER — FUROSEMIDE 10 MG/ML IJ SOLN
40.0000 mg | Freq: Once | INTRAMUSCULAR | Status: AC
  Administered 2022-11-07: 40 mg via INTRAVENOUS
  Filled 2022-11-07: qty 4

## 2022-11-07 MED ORDER — TORSEMIDE 20 MG PO TABS
80.0000 mg | ORAL_TABLET | Freq: Every day | ORAL | Status: DC
  Administered 2022-11-07: 80 mg via ORAL
  Filled 2022-11-07: qty 4

## 2022-11-07 NOTE — Progress Notes (Addendum)
[facility]                                                                              [name], is a 89 y.o. female, DOB - Aug 19, 1933, UJW:[id number] Admit date - 11/04/2022    Outpatient Primary MD for the patient is [name], MD  LOS - 3  days  Chief Complaint  Patient presents with   Low Hgb   Weakness       Brief summary   Patient is a 89 year old female with HCOM, NICM, permanent A-fib on Eliquis, chronic hypoxic respiratory failure on 2 L O2 via [place] continuously, carotid artery stenosis, PAD, HTN, HLP, chronic HFpEF, hypothyroidism, CKD 3B, recent herpes zoster infection, history of blood transfusion reaction, renal cell carcinoma status post nephrectomy in 2003, history of angiodysplasia of the stomach and duodenum/gastritis on daily PPI, who presented with generalized fatigue.  Associated with exertional shortness of breath.  On Thursday, 5 days prior to admission, patient was seen by her PCP.  Blood work showed acute blood loss anemia with hemoglobin of 8, baseline 10.  Patient was recommended to go to ED.   Endorses melanic stools x 2 weeks.  Intermittent nausea with no vomiting.  No significant abdominal pain.  Also endorses lightheadedness upon standing.  She is compliant with her home Eliquis, last dose was taken this morning.  She decided to come to the ED for further evaluation.   Assessment & Plan    Acute blood loss anemia, Symptomatic anemia - Presented with melena x 2 weeks, exertional dyspnea, generalized fatigue, drop in hemoglobin. Hemoglobin 6.8 on presentation - s/p 2 units pRBC's (history of transfusion reaction in the past) -CTA abdomen pelvis negative for active GI bleed, cirrhosis with indeterminate liver lesions, recommended MRI. -Underwent small bowel enteroscopy on 7/3, no gross lesions in the majority of esophagus, LA grade a esophagitis with no bleeding at the distal esophagus and GE junction, 2 angiodysplastic lesions  with no bleeding in third and fourth portion of duodenum.  3 nonbleeding angioplastic lesions in proximal jejunum, fulguration to ablate the lesion to prevent bleeding by argon plasma was successful. -Recommended to continue PPI 40 mg daily, IV iron inpatient, may resume Eliquis in 72 hours. -Receiving IV iron, will need IV iron as an outpatient with hematology. -In 1 week, restart oral iron ferrous gluconate or ferrous sulfate once daily.  If patient has recurrent iron deficiency anemia, she will need colonoscopy +/- capsule endoscopy.   History of angiodysplasia of the stomach and duodenum History of gastritis -Transition to oral Protonix 40 mg twice daily -as #1   History of pancreatic cancer -Seen by Dr. Mosetta Putt outpatient. Per the patient, she missed 3 scheduled therapies due to herpes zoster infection, it has now resolved. -Dr. Mosetta Putt notified by admitting physician.   Permanent atrial fibrillation on Eliquis Currently rate controlled. -Eliquis on hold due to suspected upper GI bleed -Resume Eliquis in 72 hours per GI recommendations    CKD 3B -Appears to be close to her baseline, creatinine 1.4-1.6 -Continue to avoid nephrotoxic agents and hypotension.   Mild leukopenia -Resolved   Generalized weakness, suspect contributed  by severe anemia PT OT assessment Fall precautions  Hypertension, chronic HF pEF -BP somewhat elevated, add hydralazine IV as needed with parameters -2D echo 02/2022 had shown EF 60 to 65%, G2 DD -Creatinine 1.4, resume torsemide 80 mg daily (outpatient on 80 mg twice daily)  Chronic respiratory failure with hypoxia on 2 L O2 -Continue O2, at baseline  HLP -Resume Crestor  Estimated body mass index is 21.91 kg/m as calculated from the following:   Height as of this encounter: 5' 6.5" (1.689 m).   Weight as of this encounter: 62.5 kg.  Code Status: DNR DVT Prophylaxis:  SCDs Start: 11/04/22 2044   Level of Care: Level of care:  Progressive Family Communication: Updated patient Disposition Plan:      Remains inpatient appropriate: Feeling weak today, states she does not have anyone at home to care for her, lives with grandson who is not at home today.  PT reevaluation and DC home in a.m.   Procedures:  Small bowel enteroscopy  Consultants:   Gastroenterology  Antimicrobials: None   Medications  sodium chloride   Intravenous Once   pantoprazole  40 mg Oral BID   torsemide  80 mg Oral Daily      Subjective:   [name] was seen and examined today.  Feeling generalized weakness, no nausea vomiting, abdominal pain, hematochezia or melena.  No acute events overnight. Objective:   Vitals:   11/06/22 2343 11/07/22 0254 11/07/22 0753 11/07/22 1109  BP: 132/61 130/74 (!) 119/51 (!) 170/62  Pulse: 60 60 (!) 59 (!) 59  Resp: 19 14 17 20   Temp: 98.9 F (37.2 C) 98.1 F (36.7 C) (!) 97.4 F (36.3 C) 98.7 F (37.1 C)  TempSrc: Oral Oral Oral Oral  SpO2: 96% 96%    Weight:      Height:        Intake/Output Summary (Last 24 hours) at 11/07/2022 1316 Last data filed at 11/06/2022 1910 Gross per 24 hour  Intake 540 ml  Output --  Net 540 ml     Wt Readings from Last 3 Encounters:  11/06/22 62.5 kg  10/31/22 62.6 kg  09/16/22 60.1 kg    Physical Exam General: Alert and oriented x 3, NAD Cardiovascular: S1 S2 clear, RRR.  Respiratory: CTAB, no wheezing Gastrointestinal: Soft, nontender, nondistended, NBS Ext: trace pedal edema bilaterally Neuro: no new deficits Psych: Normal affect   Data Reviewed:  I have personally reviewed following labs    CBC Lab Results  Component Value Date   WBC 7.6 11/06/2022   RBC 3.57 (L) 11/06/2022   HGB 9.2 (L) 11/06/2022   HCT 30.4 (L) 11/06/2022   MCV 85.2 11/06/2022   MCH 25.8 (L) 11/06/2022   PLT 189 11/06/2022   MCHC 30.3 11/06/2022   RDW 21.9 (H) 11/06/2022   LYMPHSABS 0.4 (L) 10/31/2022   MONOABS 0.5 10/31/2022   EOSABS 0.0 10/31/2022    BASOSABS 0.0 10/31/2022     Last metabolic panel Lab Results  Component Value Date   NA 134 (L) 11/06/2022   K 4.1 11/06/2022   CL 99 11/06/2022   CO2 24 11/06/2022   BUN 33 (H) 11/06/2022   CREATININE 1.40 (H) 11/06/2022   GLUCOSE 135 (H) 11/06/2022   GFRNONAA 37 (L) 11/06/2022   GFRAA 39 (L) 12/23/2019   CALCIUM 9.2 11/06/2022   PHOS 2.8 02/19/2022   PROT 6.7 10/31/2022   ALBUMIN 4.2 10/31/2022   LABGLOB 2.6 12/23/2019   BILITOT 0.5 10/31/2022   ALKPHOS  51 10/31/2022   AST 15 10/31/2022   ALT 7 10/31/2022   ANIONGAP 11 11/06/2022    CBG (last 3)  No results for input(s): "GLUCAP" in the last 72 hours.    Coagulation Profile: No results for input(s): "INR", "PROTIME" in the last 168 hours.   Radiology Studies: I have personally reviewed the imaging studies  No results found.     Thad Ranger M.D. [facility] 11/07/2022, 1:16 PM  Available via Epic secure chat 7am-7pm After 7 pm, please refer to night coverage provider listed on amion.

## 2022-11-07 NOTE — Plan of Care (Signed)
  Problem: Education: Goal: Knowledge of General Education information will improve Description Including pain rating scale, medication(s)/side effects and non-pharmacologic comfort measures Outcome: Progressing   Problem: Health Behavior/Discharge Planning: Goal: Ability to manage health-related needs will improve Outcome: Progressing   Problem: Clinical Measurements: Goal: Ability to maintain clinical measurements within normal limits will improve Outcome: Progressing Goal: Will remain free from infection Outcome: Progressing Goal: Diagnostic test results will improve Outcome: Progressing   Problem: Nutrition: Goal: Adequate nutrition will be maintained Outcome: Progressing   Problem: Pain Managment: Goal: General experience of comfort will improve Outcome: Progressing   Problem: Safety: Goal: Ability to remain free from injury will improve Outcome: Progressing   Problem: Skin Integrity: Goal: Risk for impaired skin integrity will decrease Outcome: Progressing   

## 2022-11-07 NOTE — Progress Notes (Signed)
Mobility Specialist Progress Note:   11/07/22 1139  Mobility  Activity Ambulated with assistance in room  Level of Assistance Contact guard assist, steadying assist  Assistive Device Front wheel walker  Distance Ambulated (ft) 60 ft  Activity Response Tolerated well  Mobility Referral Yes  $Mobility charge 1 Mobility  Mobility Specialist Start Time (ACUTE ONLY) 1115  Mobility Specialist Stop Time (ACUTE ONLY) 1130  Mobility Specialist Time Calculation (min) (ACUTE ONLY) 15 min    Pre Mobility: 73 HR  During Mobility: 93 HR  Post Mobility: 73 HR  93% SpO2  Pt received EOB, agreeable to mobility. Pt ambulated 2 laps around room on 3 L. C/o slight dizziness and SOB. Pt rated dizziness 5/10. Pt returned to EOB with VSS and call bell in hand.   Leory Plowman  Mobility Specialist Please contact via [employer] office at [phone]

## 2022-11-07 NOTE — Evaluation (Signed)
Occupational Therapy Evaluation Patient Details Name: [name] MRN: [id number] DOB: 04/10/1934 Today's Date: 11/07/2022   History of Present Illness Patient is a 89 year old female admitted 7/1 who presented with generalized fatigue.  Associated with exertional shortness of breath. Pt with symptomatic anemia.  Did receive blood.  Also with gastritis with procedures planned.  PMH: HCOM, NICM, permanent A-fib on Eliquis, chronic hypoxic respiratory failure on 2 L O2 via [place] continuously, carotid artery stenosis, PAD, HTN, HLP, chronic HFpEF, hypothyroidism, CKD 3B, recent herpes zoster infection, history of blood transfusion reaction, renal cell carcinoma status post nephrectomy in 2003, history of angiodysplasia of the stomach and duodenum/gastritis on daily PPI   Clinical Impression   Patient admitted for the diagnosis above.  PTA she lives at home with her great grandson, and has daughter's that also check on her when grandson is at work.  She remains very independent at home using a RW for mobility, and continues to drive and perform her own ADL and iADL.  Bilateral foot pain and generalized weakness are the primary deficits, currently needing up to supervision for mobility and CGA for ADL completion from a sit to stand level.  OT will continue efforts in the acute setting to address deficits and assist with transition home.  No post acute OT is anticipated.       Recommendations for follow up therapy are one component of a multi-disciplinary discharge planning process, led by the attending physician.  Recommendations may be updated based on patient status, additional functional criteria and insurance authorization.   Assistance Recommended at Discharge Set up Supervision/Assistance  Patient can return home with the following Assist for transportation;Assistance with cooking/housework    Functional Status Assessment  Patient has had a recent decline in their functional status and  demonstrates the ability to make significant improvements in function in a reasonable and predictable amount of time.  Equipment Recommendations  None recommended by OT    Recommendations for Other Services       Precautions / Restrictions Precautions Precautions: Fall Precaution Comments: Scabbed tender areas to B feet. Restrictions Weight Bearing Restrictions: No      Mobility Bed Mobility Overal bed mobility: Independent                  Transfers Overall transfer level: Needs assistance Equipment used: Rolling walker (2 wheels) Transfers: Sit to/from Stand Sit to Stand: Supervision                  Balance Overall balance assessment: Needs assistance Sitting-balance support: No upper extremity supported, Feet supported Sitting balance-Leahy Scale: Good     Standing balance support: Reliant on assistive device for balance Standing balance-Leahy Scale: Fair                             ADL either performed or assessed with clinical judgement   ADL       Grooming: Set up;Sitting               Lower Body Dressing: Min guard;Sit to/from stand   Toilet Transfer: Rolling walker (2 wheels);Regular Toilet;Ambulation;Supervision/safety                   Vision Baseline Vision/History: 1 Wears glasses Patient Visual Report: No change from baseline       Perception     Praxis      Pertinent Vitals/Pain Pain Assessment Pain Assessment: Faces Faces Pain Scale:  Hurts even more Pain Location: B feet Pain Descriptors / Indicators: Tender Pain Intervention(s): Monitored during session     Hand Dominance Right   Extremity/Trunk Assessment Upper Extremity Assessment Upper Extremity Assessment: Overall WFL for tasks assessed   Lower Extremity Assessment Lower Extremity Assessment: Defer to PT evaluation   Cervical / Trunk Assessment Cervical / Trunk Assessment: Normal   Communication Communication Communication: No  difficulties   Cognition Arousal/Alertness: Awake/alert Behavior During Therapy: WFL for tasks assessed/performed Overall Cognitive Status: Within Functional Limits for tasks assessed                                       General Comments   VSS on RA    Exercises     Shoulder Instructions      Home Living Family/patient expects to be discharged to:: Private residence Living Arrangements: Other relatives;Other (Comment) Available Help at Discharge: Family;Available PRN/intermittently Type of Home: House Home Access: Stairs to enter   Entrance Stairs-Rails: Right;Left;Can reach both Home Layout: One level     Bathroom Shower/Tub: Chief Strategy Officer: Handicapped height Bathroom Accessibility: Yes   Home Equipment: Pharmacist, hospital (2 wheels);Cane - single point;Grab bars - tub/shower;BSC/3in1   Additional Comments: Ivor Costa works 24 hour shifts, then has four days off.  She has several daughter's that check on her.      Prior Functioning/Environment Prior Level of Function : Independent/Modified Independent               ADLs Comments: independent ADLs, IADLs, driving        OT Problem List: Decreased activity tolerance;Impaired balance (sitting and/or standing);Pain      OT Treatment/Interventions: Self-care/ADL training;Therapeutic activities;Balance training;Patient/family education;DME and/or AE instruction    OT Goals(Current goals can be found in the care plan section) Acute Rehab OT Goals Patient Stated Goal: Home soon OT Goal Formulation: With patient Time For Goal Achievement: 11/21/22 Potential to Achieve Goals: Good ADL Goals Pt Will Perform Grooming: with modified independence;standing Pt Will Perform Lower Body Dressing: with modified independence;sit to/from stand Pt Will Transfer to Toilet: with modified independence;ambulating;regular height toilet  OT Frequency: Min 1X/week    Co-evaluation               AM-PAC OT "6 Clicks" Daily Activity     Outcome Measure Help from another person eating meals?: None Help from another person taking care of personal grooming?: None Help from another person toileting, which includes using toliet, bedpan, or urinal?: A Little Help from another person bathing (including washing, rinsing, drying)?: A Little Help from another person to put on and taking off regular upper body clothing?: None Help from another person to put on and taking off regular lower body clothing?: A Little 6 Click Score: 21   End of Session Equipment Utilized During Treatment: Rolling walker (2 wheels) Nurse Communication: Mobility status  Activity Tolerance: Patient limited by pain Patient left: in bed;with call bell/phone within reach;with family/visitor present  OT Visit Diagnosis: Pain;Unsteadiness on feet (R26.81) Pain - part of body: Ankle and joints of foot                Time: 1350-1403 OT Time Calculation (min): 13 min Charges:  OT General Charges $OT Visit: 1 Visit OT Evaluation $OT Eval Moderate Complexity: 1 Mod  11/07/2022  RP, OTR/L  Acute Rehabilitation Services  Office:  [phone]  Draco Malczewski D Gamal [place]isco 11/07/2022, 2:16 PM

## 2022-11-07 NOTE — Progress Notes (Signed)
Physical Therapy Treatment Patient Details Name: [name] MRN: [id number] DOB: 04-28-1934 Today's Date: 11/07/2022   History of Present Illness Patient is a 89 year old female admitted 7/1 who presented with generalized fatigue.  Associated with exertional shortness of breath. Pt with symptomatic anemia.  Did receive blood.  Also with gastritis with procedures planned.  PMH: HCOM, NICM, permanent A-fib on Eliquis, chronic hypoxic respiratory failure on 2 L O2 via [place] continuously, carotid artery stenosis, PAD, HTN, HLP, chronic HFpEF, hypothyroidism, CKD 3B, recent herpes zoster infection, history of blood transfusion reaction, renal cell carcinoma status post nephrectomy in 2003, history of angiodysplasia of the stomach and duodenum/gastritis on daily PPI    PT Comments  Patient received seated upright EOB with family member present. Progressed patient towards goals per plan of care. Able to transfer from bedside without physical assistance from PT. O2 sat monitored throughout session, readings unreliable (poor waveform) due to grip on RW. Able to progress ambulation distance without any bouts of LOB. Report of fatigue with harder ability to converse with PT towards end of ambulation. Will continue to monitor acute stay. Anticipating patient to return home with family support as needed following acute stay.     Assistance Recommended at Discharge Intermittent Supervision/Assistance  If plan is discharge home, recommend the following:  Can travel by private vehicle    Assistance with cooking/housework      Equipment Recommendations  None recommended by PT    Recommendations for Other Services       Precautions / Restrictions Precautions Precautions: Fall Precaution Comments: Scabbed tender areas to B feet. Restrictions Weight Bearing Restrictions: No     Mobility  Bed Mobility Overal bed mobility: Independent             General bed mobility comments: Received sitting  on EOB    Transfers Overall transfer level: Needs assistance Equipment used: Rolling walker (2 wheels) Transfers: Sit to/from Stand Sit to Stand: Supervision           General transfer comment: Eager to transfer OOB for mobilization. Able to complete without physical assistance.    Ambulation/Gait Ambulation/Gait assistance: Supervision Gait Distance (Feet): 150 Feet Assistive device: Rolling walker (2 wheels) Gait Pattern/deviations: Step-through pattern, Decreased step length - right, Decreased stride length Gait velocity: decreased Gait velocity interpretation: <1.8 ft/sec, indicate of risk for recurrent falls   General Gait Details: Patient lapped around room x3 with RW; aware of surroundings and no instances of LOB or SOB. Walked in room without socks or shoes due to pt report of pain with compression.   Stairs             Wheelchair Mobility     Tilt Bed    Modified Rankin (Stroke Patients Only)       Balance Overall balance assessment: Needs assistance Sitting-balance support: No upper extremity supported, Feet supported Sitting balance-Leahy Scale: Good Sitting balance - Comments: Sitting EOB   Standing balance support: Bilateral upper extremity supported, During functional activity, Reliant on assistive device for balance Standing balance-Leahy Scale: Fair Standing balance comment: Able to statically stand without UE support; relies on RW during dynamic activities.                            Cognition Arousal/Alertness: Awake/alert Behavior During Therapy: WFL for tasks assessed/performed Overall Cognitive Status: Within Functional Limits for tasks assessed  General Comments: Pleasant throughout session.        Exercises      General Comments General comments (skin integrity, edema, etc.): O2 Sat readings unreliable due to grip on RW. Right forearm swelling post IV removal, RN  notified.      Pertinent Vitals/Pain Pain Assessment Pain Assessment: No/denies pain    Home Living Family/patient expects to be discharged to:: Private residence Living Arrangements: Other relatives;Other (Comment) Available Help at Discharge: Family;Available PRN/intermittently Type of Home: House Home Access: Stairs to enter Entrance Stairs-Rails: Right;Left;Can reach both     Home Layout: One level Home Equipment: Pharmacist, hospital (2 wheels);Cane - single point;Grab bars - tub/shower;BSC/3in1 Additional Comments: Ivor Costa works 24 hour shifts, then has four days off.  She has several daughter's that check on her.    Prior Function            PT Goals (current goals can now be found in the care plan section) Acute Rehab PT Goals PT Goal Formulation: With patient Time For Goal Achievement: 11/20/22 Potential to Achieve Goals: Good Progress towards PT goals: Progressing toward goals    Frequency    Min 3X/week      PT Plan Current plan remains appropriate    Co-evaluation              AM-PAC PT "6 Clicks" Mobility   Outcome Measure  Help needed turning from your back to your side while in a flat bed without using bedrails?: None Help needed moving from lying on your back to sitting on the side of a flat bed without using bedrails?: None Help needed moving to and from a bed to a chair (including a wheelchair)?: None Help needed standing up from a chair using your arms (e.g., wheelchair or bedside chair)?: A Little Help needed to walk in hospital room?: A Little Help needed climbing 3-5 steps with a railing? : A Little 6 Click Score: 21    End of Session Equipment Utilized During Treatment: Gait belt;Oxygen Activity Tolerance: Patient tolerated treatment well Patient left: in bed;with call bell/phone within reach;with family/visitor present Nurse Communication: Mobility status PT Visit Diagnosis: Muscle weakness (generalized) (M62.81)      Time: 5409-8119 PT Time Calculation (min) (ACUTE ONLY): 22 min  Charges:    $Gait Training: 8-22 mins PT General Charges $$ ACUTE PT VISIT: 1 Visit                     Christene Lye, SPT Acute Rehabilitation Services [phone] Secure chat preferred     Christene Lye 11/07/2022, 5:14 PM

## 2022-11-07 NOTE — Care Management Important Message (Signed)
Important Message  Patient Details  Name: [name] MRN: [id number] Date of Birth: 10/20/1933   Medicare Important Message Given:  Yes     Dorena Bodo 11/07/2022, 11:48 AM

## 2022-11-08 ENCOUNTER — Inpatient Hospital Stay (HOSPITAL_COMMUNITY): Payer: PPO

## 2022-11-08 DIAGNOSIS — D500 Iron deficiency anemia secondary to blood loss (chronic): Secondary | ICD-10-CM | POA: Diagnosis not present

## 2022-11-08 DIAGNOSIS — D649 Anemia, unspecified: Secondary | ICD-10-CM | POA: Diagnosis not present

## 2022-11-08 DIAGNOSIS — D3A8 Other benign neuroendocrine tumors: Secondary | ICD-10-CM | POA: Diagnosis not present

## 2022-11-08 DIAGNOSIS — K921 Melena: Secondary | ICD-10-CM | POA: Diagnosis not present

## 2022-11-08 LAB — CBC
HCT: 26.9 % — ABNORMAL LOW (ref 36.0–46.0)
Hemoglobin: 8.1 g/dL — ABNORMAL LOW (ref 12.0–15.0)
MCH: 26.1 pg (ref 26.0–34.0)
MCHC: 30.1 g/dL (ref 30.0–36.0)
MCV: 86.8 fL (ref 80.0–100.0)
Platelets: 182 10*3/uL (ref 150–400)
RBC: 3.1 MIL/uL — ABNORMAL LOW (ref 3.87–5.11)
RDW: 21.6 % — ABNORMAL HIGH (ref 11.5–15.5)
WBC: 5.3 10*3/uL (ref 4.0–10.5)
nRBC: 0.4 % — ABNORMAL HIGH (ref 0.0–0.2)

## 2022-11-08 LAB — BASIC METABOLIC PANEL
Anion gap: 10 (ref 5–15)
BUN: 35 mg/dL — ABNORMAL HIGH (ref 8–23)
CO2: 26 mmol/L (ref 22–32)
Calcium: 8.5 mg/dL — ABNORMAL LOW (ref 8.9–10.3)
Chloride: 102 mmol/L (ref 98–111)
Creatinine, Ser: 1.56 mg/dL — ABNORMAL HIGH (ref 0.44–1.00)
GFR, Estimated: 32 mL/min — ABNORMAL LOW (ref >60)
Glucose, Bld: 106 mg/dL — ABNORMAL HIGH (ref 70–99)
Potassium: 3.6 mmol/L (ref 3.5–5.1)
Sodium: 138 mmol/L (ref 135–145)

## 2022-11-08 LAB — BRAIN NATRIURETIC PEPTIDE: B Natriuretic Peptide: 251.2 pg/mL — ABNORMAL HIGH (ref 0.0–100.0)

## 2022-11-08 MED ORDER — FUROSEMIDE 10 MG/ML IJ SOLN
40.0000 mg | Freq: Two times a day (BID) | INTRAMUSCULAR | Status: AC
  Administered 2022-11-08 – 2022-11-09 (×3): 40 mg via INTRAVENOUS
  Filled 2022-11-08 (×3): qty 4

## 2022-11-08 MED ORDER — TORSEMIDE 20 MG PO TABS: 80.0000 mg | ORAL_TABLET | Freq: Two times a day (BID) | ORAL | Status: DC

## 2022-11-08 NOTE — Progress Notes (Signed)
[facility]                                                                              [name], is a 89 y.o. female, DOB - May 06, 1934, ZOX:[id number] Admit date - 11/04/2022    Outpatient Primary MD for the patient is [name], MD  LOS - 4  days  Chief Complaint  Patient presents with   Low Hgb   Weakness       Brief summary   Patient is a 89 year old female with HCOM, NICM, permanent A-fib on Eliquis, chronic hypoxic respiratory failure on 2 L O2 via [place] continuously, carotid artery stenosis, PAD, HTN, HLP, chronic HFpEF, hypothyroidism, CKD 3B, recent herpes zoster infection, history of blood transfusion reaction, renal cell carcinoma status post nephrectomy in 2003, history of angiodysplasia of the stomach and duodenum/gastritis on daily PPI, who presented with generalized fatigue.  Associated with exertional shortness of breath.  On Thursday, 5 days prior to admission, patient was seen by her PCP.  Blood work showed acute blood loss anemia with hemoglobin of 8, baseline 10.  Patient was recommended to go to ED.   Endorses melanic stools x 2 weeks.  Intermittent nausea with no vomiting.  No significant abdominal pain.  Also endorses lightheadedness upon standing.  She is compliant with her home Eliquis, last dose was taken this morning.  She decided to come to the ED for further evaluation.   Assessment & Plan    Acute blood loss anemia, Symptomatic anemia - Presented with melena x 2 weeks, exertional dyspnea, generalized fatigue, drop in hemoglobin. Hemoglobin 6.8 on presentation - s/p 2 units pRBC's (history of transfusion reaction in the past) -CTA abdomen pelvis negative for active GI bleed, cirrhosis with indeterminate liver lesions, recommended MRI. -Underwent small bowel enteroscopy on 7/3, no gross lesions in the majority of esophagus, LA grade a esophagitis with no bleeding at the distal esophagus and GE junction, 2 angiodysplastic lesions  with no bleeding in third and fourth portion of duodenum.  3 nonbleeding angioplastic lesions in proximal jejunum, fulguration to ablate the lesion to prevent bleeding by argon plasma was successful. -Recommended to continue PPI 40 mg daily, IV iron inpatient, may resume Eliquis in 72 hours. -Status post IV iron on 7/3, 7/4,  will need IV iron as an outpatient with hematology. -In 1 week, restart oral iron ferrous gluconate or ferrous sulfate once daily.  If patient has recurrent iron deficiency anemia, she will need colonoscopy +/- capsule endoscopy.  Acute on chronic diastolic CHF Bilateral pleural effusions, R>L  -Overnight, patient noted to have shortness of breath, had fluid overload -BNP 251.2, chest x-ray showed similar left increased right pleural effusion  -Received Lasix 40 mg IV x 1 overnight, will place on Lasix 40 mg IV twice daily x 3 doses and resume torsemide upon discharge.   -Creatinine 1.5, close to baseline.  Monitor closely with IV diuretics -Ultrasound-guided right-sided thoracentesis, previous thoracentesis on 02/22/2022   History of angiodysplasia of the stomach and duodenum History of gastritis -Transition to oral Protonix 40 mg twice daily -as #1   History of pancreatic cancer -Seen by Dr.  [facility] outpatient. Per the patient, she missed 3 scheduled therapies due to herpes zoster infection, it has now resolved. -Dr. Mosetta Putt notified by admitting physician.   Permanent atrial fibrillation on Eliquis Currently rate controlled. -Eliquis on hold due to suspected upper GI bleed -Resume Eliquis in 72 hours per GI recommendations    CKD 3B -Appears to be close to her baseline, creatinine 1.4-1.6 -Monitor renal function with diuretics.   Mild leukopenia -Resolved   Generalized weakness, suspect contributed by severe anemia PT OT assessment Fall precautions  Hypertension -BP currently stable   Chronic respiratory failure with hypoxia on 2 L O2 -Continue O2, at  baseline  HLP -Resume Crestor  Estimated body mass index is 21.91 kg/m as calculated from the following:   Height as of this encounter: 5' 6.5" (1.689 m).   Weight as of this encounter: 62.5 kg.  Code Status: DNR DVT Prophylaxis:  SCDs Start: 11/04/22 2044   Level of Care: Level of care: Progressive Family Communication: Updated patient Disposition Plan:      Remains inpatient appropriate: Acute issues overnight, hypoxia, volume overload precludes safe discharge home today.   Procedures:  Small bowel enteroscopy  Consultants:   Gastroenterology  Antimicrobials: None   Medications  sodium chloride   Intravenous Once   furosemide  40 mg Intravenous Q12H   pantoprazole  40 mg Oral BID   rosuvastatin  40 mg Oral QPM      Subjective:   [name] was seen and examined today.  Overnight felt short of breath and volume overload with swollen ankles.  No acute chest pain, nausea vomiting, abdominal pain. Objective:   Vitals:   11/07/22 1937 11/07/22 2323 11/08/22 0346 11/08/22 0721  BP: 107/67 (!) 124/53 (!) 130/47 (!) 136/56  Pulse: 61 60 60 62  Resp: 19 16 19 20   Temp: 97.9 F (36.6 C) 97.9 F (36.6 C) 97.9 F (36.6 C) 97.8 F (36.6 C)  TempSrc: Oral Oral Oral Oral  SpO2: 92% 98% 100% 98%  Weight:      Height:        Intake/Output Summary (Last 24 hours) at 11/08/2022 1028 Last data filed at 11/08/2022 0900 Gross per 24 hour  Intake 755.2 ml  Output 525 ml  Net 230.2 ml     Wt Readings from Last 3 Encounters:  11/06/22 62.5 kg  10/31/22 62.6 kg  09/16/22 60.1 kg   Physical Exam General: Alert and oriented x 3, NAD Cardiovascular: S1 S2 clear, RRR.  Respiratory: Decreased breath sounds at the bases, no wheezing Gastrointestinal: Soft, nontender, nondistended, NBS Ext: 1+ pedal edema bilaterally Neuro: no new deficits Psych: Normal affect   Data Reviewed:  I have personally reviewed following labs    CBC Lab Results  Component Value Date    WBC 5.3 11/08/2022   RBC 3.10 (L) 11/08/2022   HGB 8.1 (L) 11/08/2022   HCT 26.9 (L) 11/08/2022   MCV 86.8 11/08/2022   MCH 26.1 11/08/2022   PLT 182 11/08/2022   MCHC 30.1 11/08/2022   RDW 21.6 (H) 11/08/2022   LYMPHSABS 0.4 (L) 10/31/2022   MONOABS 0.5 10/31/2022   EOSABS 0.0 10/31/2022   BASOSABS 0.0 10/31/2022     Last metabolic panel Lab Results  Component Value Date   NA 138 11/08/2022   K 3.6 11/08/2022   CL 102 11/08/2022   CO2 26 11/08/2022   BUN 35 (H) 11/08/2022   CREATININE 1.56 (H) 11/08/2022   GLUCOSE 106 (H) 11/08/2022   GFRNONAA 32 (  L) 11/08/2022   GFRAA 39 (L) 12/23/2019   CALCIUM 8.5 (L) 11/08/2022   PHOS 2.8 02/19/2022   PROT 6.7 10/31/2022   ALBUMIN 4.2 10/31/2022   LABGLOB 2.6 12/23/2019   BILITOT 0.5 10/31/2022   ALKPHOS 51 10/31/2022   AST 15 10/31/2022   ALT 7 10/31/2022   ANIONGAP 10 11/08/2022    CBG (last 3)  No results for input(s): "GLUCAP" in the last 72 hours.    Coagulation Profile: No results for input(s): "INR", "PROTIME" in the last 168 hours.   Radiology Studies: I have personally reviewed the imaging studies  DG CHEST PORT 1 VIEW  Result Date: 11/07/2022 CLINICAL DATA:  Weakness, volume overload, leg swelling and shortness of breath EXAM: PORTABLE CHEST 1 VIEW COMPARISON:  02/22/2022 FINDINGS: Stable cardiomediastinal silhouette. CardioMEMS device. Left chest wall ICD. Small bilateral pleural effusions and associated atelectasis. No pneumothorax. No displaced rib fractures. IMPRESSION: Similar left and increased right pleural effusion since 02/22/2022. Electronically Signed   By: Minerva Fester M.D.   On: 11/07/2022 20:36       Willadene Mounsey M.D. [facility] 11/08/2022, 10:28 AM  Available via Epic secure chat 7am-7pm After 7 pm, please refer to night coverage provider listed on amion.

## 2022-11-08 NOTE — Progress Notes (Signed)
Patient sitting at the edge of bed, states, "I'm was feeling a little SOB; I feel like I have extra fluid in my lungs." Oxygen saturation 96%, 20 bpm, and patient does not appear distressed.   She explains that she uses high doses of diuretics at home, and has been fluid overloaded several times in the past, and this feels very similar.  Of note, patient did get a blood transfusion yesterday.  MD paged. CXR ordered. One dose of IV Lasix 40 mg ordered.

## 2022-11-08 NOTE — Consult Note (Signed)
   [facility] Inpatient Consult   11/08/2022  [name] 1933/08/16 [id number]  [facility] [[facility]]  Accountable Care Organization [ACO] Patient: [employer]  Primary Care Provider:  [name], MD with [place] [facility] which is listed to provide the transition of care follow up.  Patient screened for hospitalization with noted high risk score for unplanned readmission risk 3 day length of stay and  to assess for potential [facility]  [[facility]] Care Management service needs for post hospital transition for care coordination.  Remote review performed of patient's electronic medical record to assess post hospital needs reveals patient is for returning home currently for disposition needs.  Spoke with the patient via hospital bedside phone, HIPAA verified, regarding post hospital care coordination needs. Explained call on behalf of [facility] with provider and insurance. Patient is accepting and gracious.  Patient states she had no real concerns except for like getting her chemo shot, while inpatient, if possible, due to having missed it because of her history of shingles, which has cleared up.  She states she does have an appointment for 11/20/22 and plan to keep that appointment. Patient states but if she could get it now it would keep her from having to [facility] due to not having a large family support but have been able to meet her appointments.  Encourage her to speak with the inpatient [facility] team and also speak with her HTA concierge about the possibel transportation benefit.   Plan:  Continue to follow progress and disposition to assess for post hospital community care coordination/management needs.  Referral request for community care coordination: will refer for post hospital follow up in community.  Of note, [facility] Care Management/Population Health does not replace or interfere with any arrangements made by the Inpatient Transition of Care  team.  For questions contact:   Charlesetta Shanks, RN BSN CCM [facility]  [phone] business mobile phone Toll free office [phone]  *Concierge Line  [phone] Fax number: [phone] [email or web address]

## 2022-11-08 NOTE — Progress Notes (Signed)
Patient was brought to IR for ordered thoracentesis. Upon examination with ultrasound, minimal pleural fluid was visualized in both left and right chest. No safe window for thoracentesis was identified; thoracentesis was not performed today. Kennieth Francois, PA-C 1:35 PM

## 2022-11-09 DIAGNOSIS — D500 Iron deficiency anemia secondary to blood loss (chronic): Secondary | ICD-10-CM | POA: Diagnosis not present

## 2022-11-09 DIAGNOSIS — K921 Melena: Secondary | ICD-10-CM | POA: Diagnosis not present

## 2022-11-09 DIAGNOSIS — D649 Anemia, unspecified: Secondary | ICD-10-CM | POA: Diagnosis not present

## 2022-11-09 DIAGNOSIS — D3A8 Other benign neuroendocrine tumors: Secondary | ICD-10-CM | POA: Diagnosis not present

## 2022-11-09 LAB — CBC
HCT: 27 % — ABNORMAL LOW (ref 36.0–46.0)
Hemoglobin: 8.2 g/dL — ABNORMAL LOW (ref 12.0–15.0)
MCH: 26.5 pg (ref 26.0–34.0)
MCHC: 30.4 g/dL (ref 30.0–36.0)
MCV: 87.4 fL (ref 80.0–100.0)
Platelets: 176 10*3/uL (ref 150–400)
RBC: 3.09 MIL/uL — ABNORMAL LOW (ref 3.87–5.11)
RDW: 21.3 % — ABNORMAL HIGH (ref 11.5–15.5)
WBC: 4.2 10*3/uL (ref 4.0–10.5)
nRBC: 0 % (ref 0.0–0.2)

## 2022-11-09 LAB — BASIC METABOLIC PANEL
Anion gap: 10 (ref 5–15)
BUN: 35 mg/dL — ABNORMAL HIGH (ref 8–23)
CO2: 29 mmol/L (ref 22–32)
Calcium: 8.8 mg/dL — ABNORMAL LOW (ref 8.9–10.3)
Chloride: 100 mmol/L (ref 98–111)
Creatinine, Ser: 1.48 mg/dL — ABNORMAL HIGH (ref 0.44–1.00)
GFR, Estimated: 34 mL/min — ABNORMAL LOW (ref >60)
Glucose, Bld: 102 mg/dL — ABNORMAL HIGH (ref 70–99)
Potassium: 3.7 mmol/L (ref 3.5–5.1)
Sodium: 139 mmol/L (ref 135–145)

## 2022-11-09 MED ORDER — POTASSIUM CHLORIDE CRYS ER 20 MEQ PO TBCR: 20.0000 meq | EXTENDED_RELEASE_TABLET | Freq: Two times a day (BID) | ORAL | Status: DC

## 2022-11-09 MED ORDER — APIXABAN 2.5 MG PO TABS: 2.5000 mg | ORAL_TABLET | Freq: Two times a day (BID) | ORAL | 11 refills | Status: DC

## 2022-11-09 MED ORDER — PANTOPRAZOLE SODIUM 40 MG PO TBEC: 40.0000 mg | DELAYED_RELEASE_TABLET | Freq: Every day | ORAL | 3 refills | Status: DC

## 2022-11-09 MED ORDER — FERROUS GLUCONATE 324 (38 FE) MG PO TABS: 324.0000 mg | ORAL_TABLET | Freq: Every day | ORAL | 3 refills | Status: DC

## 2022-11-09 NOTE — Discharge Summary (Signed)
Physician Discharge Summary   Patient: [name] MRN: [id number] DOB: April 20, 1934  Admit date:     11/04/2022  Discharge date: 11/09/22  Discharge Physician: Thad Ranger, MD    PCP: [name], MD   Recommendations at discharge:   Resume eliquis 11/10/2022 Started on ferrous gluconate next week Continue Protonix 40 mg daily Resume torsemide outpatient dose  Discharge Diagnoses:    Symptomatic anemia, acute blood loss anemia   bilateral pleural effusions   Primary pancreatic neuroendocrine tumor   Acute on chronic diastolic CHF (congestive heart failure) (HCC)   AVM (arteriovenous malformation) of small bowel, acquired   Anemia due to chronic blood loss   Hospital Course:  Patient is a 89 year old female with HCOM, NICM, permanent A-fib on Eliquis, chronic hypoxic respiratory failure on 2 L O2 via [place] continuously, carotid artery stenosis, PAD, HTN, HLP, chronic HFpEF, hypothyroidism, CKD 3B, recent herpes zoster infection, history of blood transfusion reaction, renal cell carcinoma status post nephrectomy in 2003, history of angiodysplasia of the stomach and duodenum/gastritis on daily PPI, who presented with generalized fatigue.  Associated with exertional shortness of breath.  On Thursday, 5 days prior to admission, patient was seen by her PCP.  Blood work showed acute blood loss anemia with hemoglobin of 8, baseline 10.  Patient was recommended to go to ED.   Endorses melanic stools x 2 weeks.  Intermittent nausea with no vomiting.  No significant abdominal pain.  Also endorses lightheadedness upon standing.  She is compliant with her home Eliquis, last dose was taken this morning.  She decided to come to the ED for further evaluation.   Assessment and Plan:  Acute blood loss anemia, Symptomatic anemia - Presented with melena x 2 weeks, exertional dyspnea, generalized fatigue, drop in hemoglobin. Hemoglobin 6.8 on presentation - s/p 2 units pRBC's (history of  transfusion reaction in the past) -CTA abdomen pelvis negative for active GI bleed, cirrhosis with indeterminate liver lesions, recommended MRI. -Underwent small bowel enteroscopy on 7/3, no gross lesions in the majority of esophagus, LA grade a esophagitis with no bleeding at the distal esophagus and GE junction, 2 angiodysplastic lesions with no bleeding in third and fourth portion of duodenum.  3 nonbleeding angioplastic lesions in proximal jejunum, fulguration to ablate the lesion to prevent bleeding by argon plasma was successful. -Recommended to continue PPI 40 mg daily, IV iron inpatient, may resume Eliquis in 72 hours. -Status post IV iron on 7/3, 7/4,  will need IV iron as an outpatient with hematology. -In 1 week, start oral iron supplementation daily.  - If patient has recurrent iron deficiency anemia, she will need colonoscopy +/- capsule endoscopy.   Acute on chronic diastolic CHF Bilateral pleural effusions, R>L  -Overnight, patient noted to have shortness of breath, had fluid overload -BNP 251.2, chest x-ray showed similar left increased right pleural effusion  -Received Lasix 40 mg IV x 1 overnight, will place on Lasix 40 mg IV twice daily x 3 doses and resume torsemide upon discharge.   -Creatinine stable, improving, 1.4 -Ultrasound-guided right-sided thoracentesis was ordered however per IR, minimal pleural fluid was visualized in both right and left chest, no safe window for thoracentesis identified.    History of angiodysplasia of the stomach and duodenum History of gastritis -Transition to oral Protonix 40 mg daily -as #1     History of pancreatic cancer -Seen by Dr. Mosetta Putt outpatient. Per the patient, she missed 3 scheduled therapies due to herpes zoster infection, it has now resolved. -  Dr. Mosetta Putt notified by admitting physician.   Permanent atrial fibrillation on Eliquis Currently rate controlled. -Eliquis was placed on hold due to suspected upper GI bleed -GI  recommended resuming Eliquis in 72 hours after EGD.  Patient may resume Eliquis on 7/7.     CKD 3B -Appears to be close to her baseline, creatinine 1.4-1.6 -Creatinine at baseline   Mild leukopenia -Resolved   Generalized weakness, suspect contributed by severe anemia PT OT assessment Fall precautions   Hypertension -BP currently stable     Chronic respiratory failure with hypoxia on 2 L O2 -Continue O2, at baseline   HLP -Resume Crestor   Estimated body mass index is 21.91 kg/m as calculated from the following:   Height as of this encounter: 5' 6.5" (1.689 m).   Weight as of this encounter: 62.5 kg.     Pain control - [employer] Controlled Substance Reporting System database was reviewed. and patient was instructed, not to drive, operate heavy machinery, perform activities at heights, swimming or participation in water activities or provide baby-sitting services while on Pain, Sleep and Anxiety Medications; until their outpatient Physician has advised to do so again. Also recommended to not to take more than prescribed Pain, Sleep and Anxiety Medications.  Consultants: Gastroenterology Procedures performed: Endoscopy Disposition: Home Diet recommendation:  Discharge Diet Orders (From admission, onward)     Start     Ordered   11/09/22 0000  Diet - low sodium heart healthy        11/09/22 1041            DISCHARGE MEDICATION: Allergies as of 11/09/2022   No Known Allergies      Medication List     TAKE these medications    acetaminophen 500 MG tablet Commonly known as: TYLENOL Take 1,000 mg by mouth every 8 (eight) hours as needed for mild pain or headache.   apixaban 2.5 MG Tabs tablet Commonly known as: Eliquis Take 1 tablet (2.5 mg total) by mouth 2 (two) times daily. Start taking on: November 10, 2022   B-12 PO Take 1 tablet by mouth daily.   Biotin 1000 MCG Chew Chew 2 1e11 Vector Genomes by mouth daily.   ferrous gluconate 324 MG  tablet Commonly known as: FERGON Take 1 tablet (324 mg total) by mouth daily with breakfast. Start taking on: November 15, 2022   fluticasone 50 MCG/ACT nasal spray Commonly known as: FLONASE Place 2 sprays into both nostrils daily as needed for allergies or rhinitis.   pantoprazole 40 MG tablet Commonly known as: PROTONIX Take 1 tablet (40 mg total) by mouth daily.   potassium chloride SA 20 MEQ tablet Commonly known as: KLOR-CON M Take 1 tablet (20 mEq total) by mouth 2 (two) times daily.   rosuvastatin 40 MG tablet Commonly known as: CRESTOR Take 1 tablet (40 mg total) by mouth daily. What changed: when to take this   torsemide 20 MG tablet Commonly known as: DEMADEX Take 4 tablets (80 mg total) by mouth 2 (two) times daily.   vitamin C 250 MG tablet Commonly known as: ASCORBIC ACID Take 500 mg by mouth daily.   VITAMIN D PO Take 4,000 Units by mouth daily.               Discharge Care Instructions  (From admission, onward)           Start     Ordered   11/09/22 0000  If the dressing is still on your incision  site when you go home, remove it on the third day after your surgery date. Remove dressing if it begins to fall off, or if it is dirty or damaged before the third day.        11/09/22 1041   11/09/22 0000  If the dressing is still on your incision site when you go home, remove it on the third day after your surgery date. Remove dressing if it begins to fall off, or if it is dirty or damaged before the third day.        11/09/22 1044            Follow-up Information     [name], MD. Schedule an appointment as soon as possible for a visit in 2 week(s).   Specialty: Family Medicine Why: for hospital follow-up Contact information: [address] [place] [address] [phone]         Mansouraty, Netty Starring., MD. Schedule an appointment as soon as possible for a visit in 4 week(s).   Specialties: Gastroenterology, Internal  Medicine Why: for hospital follow-up Contact information: [address] [place] [address] [phone]         Laurey Morale, MD. Schedule an appointment as soon as possible for a visit in 2 week(s).   Specialty: Cardiology Why: for hospital follow-up Contact information: [address] [place] [address] [phone]                Discharge Exam: Ceasar Mons Weights   11/04/22 2138 11/06/22 1305  Weight: 62.5 kg 62.5 kg   S: Denies any specific complaints, feeling a lot better now with Lasix.  Feels he can go home today.  BP (!) 144/55 (BP Location: Right Arm)   Pulse 60   Temp 98 F (36.7 C) (Oral)   Resp 17   Ht 5' 6.5" (1.689 m)   Wt 62.5 kg   SpO2 100%   BMI 21.91 kg/m   Physical Exam General: Alert and oriented x 3, NAD Cardiovascular: S1 S2 clear, RRR.  Respiratory: Diminished BS at the bases, no crackles Gastrointestinal: Soft, nontender, nondistended, NBS Ext: no pedal edema bilaterally Neuro: no new deficits Psych: Normal affect and demeanor  Condition at discharge: fair  The results of significant diagnostics from this hospitalization (including imaging, microbiology, ancillary and laboratory) are listed below for reference.   Imaging Studies: IR US CHEST  Result Date: 11/08/2022 INDICATION: Patient with history of CHF, volume overload. Request received for image guided thoracentesis EXAM: CHEST ULTRASOUND COMPARISON:  Chest XR, 11/07/2022. FINDINGS: Small left-sided pleural effusion and small right-sided pleural effusion. No significant pocket of fluid or percutaneous window to allow safe thoracentesis. IMPRESSION: Small volume of pleural effusions bilaterally. No safe window for percutaneous access. Thoracentesis was NOT performed. Read by: Mina Marble, PA-C Electronically Signed   By: Roanna Banning M.D.   On: 11/08/2022 15:04   DG CHEST PORT 1 VIEW  Result Date: 11/07/2022 CLINICAL DATA:  Weakness, volume overload, leg  swelling and shortness of breath EXAM: PORTABLE CHEST 1 VIEW COMPARISON:  02/22/2022 FINDINGS: Stable cardiomediastinal silhouette. CardioMEMS device. Left chest wall ICD. Small bilateral pleural effusions and associated atelectasis. No pneumothorax. No displaced rib fractures. IMPRESSION: Similar left and increased right pleural effusion since 02/22/2022. Electronically Signed   By: Minerva Fester M.D.   On: 11/07/2022 20:36   CT Angio Abd/Pel W and/or Wo Contrast  Result Date: 11/04/2022 CLINICAL DATA:  Lower GI bleed. EXAM: CTA  ABDOMEN AND PELVIS WITHOUT AND WITH CONTRAST TECHNIQUE: Multidetector CT imaging of the abdomen and pelvis was performed using the standard protocol during bolus administration of intravenous contrast. Multiplanar reconstructed images and MIPs were obtained and reviewed to evaluate the vascular anatomy. RADIATION DOSE REDUCTION: This exam was performed according to the departmental dose-optimization program which includes automated exposure control, adjustment of the mA and/or kV according to patient size and/or use of iterative reconstruction technique. CONTRAST:  55mL OMNIPAQUE IOHEXOL 350 MG/ML SOLN COMPARISON:  CT abdomen pelvis dated 02/16/2022 and PET CT dated 04/10/2022. FINDINGS: VASCULAR Aorta: Advanced atherosclerotic calcification of the abdominal aorta. No aneurysmal dilatation or dissection. No periaortic fluid collection. Celiac: Atherosclerotic calcification of the origin of the celiac artery. The celiac trunk and its major branches are patent. There is an accessory left hepatic artery from the left gastric artery. SMA: The SMA is patent. Renals: Atherosclerotic calcification of the origin of the left renal artery. The left renal artery is patent. IMA: The IMA is patent. Inflow: Bilateral common iliac artery stents appear patent. There is moderate atherosclerotic calcification of the iliac arteries. No aneurysmal dilatation or dissection. The iliac arteries remain  patent. Proximal Outflow: The visualized proximal outflow is patent. Veins: The IVC is unremarkable.  No portal venous gas. Review of the MIP images confirms the above findings. NON-VASCULAR Lower chest: Partially visualized small bilateral pleural effusions, right greater than left. Cardiac AICD device noted. No intra-abdominal free air or free fluid. Hepatobiliary: There is irregularity of the liver contour consistent with changes of cirrhosis, possibly related to passive congestion and cardiac dysfunction. A 1 cm enhancing focus in the posteroinferior right lobe of the liver (46/6) is not characterized, but may represent a flash filling hemangioma or portal venous shunting. Other etiologies are not excluded. This is similar to the prior CT. Indeterminate 13 mm lesion in the left lobe of the liver demonstrates similar enhancement to the background liver on arterial phase and early washout. Further characterization with MRI without and with contrast recommended. There is mild biliary dilatation, post cholecystectomy. Pancreas: A 4.5 x 4.6 cm enhancing mass in the uncinate process of the pancreas slightly increased since the prior CT. Additional enhancing lesions in the distal body/tail of the pancreas measure up to 11 mm. No dilatation of the main pancreatic duct or gland atrophy. Spleen: Normal in size without focal abnormality. Adrenals/Urinary Tract: Status post prior right nephrectomy. The right adrenal gland is not visualized and may be surgically absent. There is no hydronephrosis or nephrolithiasis on the left. The left ureter is unremarkable. The urinary bladder is minimally distended and suboptimally visualized due to streak artifact caused by bilateral total hip arthroplasties. Stomach/Bowel: There is no bowel obstruction or active inflammation. Moderate stool throughout the colon. Scattered colonic diverticula without active inflammatory changes. No evidence of active GI bleed. The appendix is not  visualized with certainty. No inflammatory changes identified in the right lower quadrant. Lymphatic: No adenopathy. Reproductive: The uterus is not visualized. Other: None Musculoskeletal: Osteopenia with degenerative changes of the spine. Bilateral total hip arthroplasties. Old L2 compression fracture with anterior wedging. No acute osseous pathology. IMPRESSION: 1. No evidence of active GI bleed. 2. Colonic diverticulosis. No bowel obstruction. 3. Cirrhosis with indeterminate liver lesions. Further characterization with MRI without and with contrast recommended. 4. Enhancing pancreatic masses, slightly increased since the prior CT. 5.  Aortic Atherosclerosis (ICD10-I70.0). Electronically Signed   By: Elgie Collard M.D.   On: 11/04/2022 20:17    Microbiology: Results for  orders placed or performed during the hospital encounter of 08/21/22  Urine Culture     Status: None   Collection Time: 08/21/22 11:32 AM   Specimen: Urine, Random  Result Value Ref Range Status   Specimen Description URINE, RANDOM  Final   Special Requests NONE  Final   Culture   Final    NO GROWTH Performed at [facility], [address] [place], [place] [address]    Report Status 08/22/2022 FINAL  Final   *Note: Due to a large number of results and/or encounters for the requested time period, some results have not been displayed. A complete set of results can be found in Results Review.    Labs: CBC: Recent Labs  Lab 11/04/22 1453 11/06/22 0008 11/07/22 2235 11/08/22 0027 11/09/22 0017  WBC 3.7* 7.6  --  5.3 4.2  HGB 6.5* 9.2* 8.0* 8.1* 8.2*  HCT 21.9* 30.4* 26.7* 26.9* 27.0*  MCV 89.4 85.2  --  86.8 87.4  PLT 187 189  --  182 176   Basic Metabolic Panel: Recent Labs  Lab 11/04/22 1453 11/06/22 0008 11/08/22 0027 11/09/22 0017  NA 139 134* 138 139  K 3.8 4.1 3.6 3.7  CL 100 99 102 100  CO2 28 24 26 29   GLUCOSE 135* 135* 106* 102*  BUN 48* 33* 35* 35*  CREATININE 1.62* 1.40* 1.56* 1.48*   CALCIUM 9.2 9.2 8.5* 8.8*   Liver Function Tests: No results for input(s): "AST", "ALT", "ALKPHOS", "BILITOT", "PROT", "ALBUMIN" in the last 168 hours. CBG: No results for input(s): "GLUCAP" in the last 168 hours.  Discharge time spent: greater than 30 minutes.  Signed: Thad Ranger, MD [facility] 11/09/2022

## 2022-11-11 ENCOUNTER — Telehealth: Payer: Self-pay | Admitting: *Deleted

## 2022-11-11 NOTE — Transitions of Care (Post Inpatient/ED Visit) (Signed)
11/11/2022  Name: [name] MRN: [id number] DOB: 1934/03/31  Today's TOC FU Call Status: Today's TOC FU Call Status:: Successful TOC FU Call Competed TOC FU Call Complete Date: 11/11/22  Transition Care Management Follow-up Telephone Call Date of Discharge: 11/09/22 Discharge Facility: [facility]) Type of Discharge: Inpatient Admission Primary Inpatient Discharge Diagnosis:: symptomatic anemia How have you been since you were released from the hospital?: Better (doing better but feels so tired) Any questions or concerns?: No  Items Reviewed: Did you receive and understand the discharge instructions provided?: Yes Medications obtained,verified, and reconciled?: Yes (Medications Reviewed) Any new allergies since your discharge?: No Dietary orders reviewed?: No Do you have support at home?: Yes People in Home: alone Name of Support/Comfort Primary Source: susan daughters and grand daughter  Medications Reviewed Today: Medications Reviewed Today     Reviewed by Luella Cook, RN (Case Manager) on 11/11/22 at 1352  Med List Status: <None>   Medication Order Taking? Sig Documenting Provider Last Dose Status Informant  acetaminophen (TYLENOL) 500 MG tablet [id number] Yes Take 1,000 mg by mouth every 8 (eight) hours as needed for mild pain or headache. [provider] Taking Active Self  apixaban (ELIQUIS) 2.5 MG TABS tablet [id number] Yes Take 1 tablet (2.5 mg total) by mouth 2 (two) times daily. Cathren Harsh, MD Taking Active   Biotin 1000 MCG CHEW [id number] Yes Chew 2 1e11 Vector Genomes by mouth daily. [provider] Taking Active Self  Cyanocobalamin (B-12 PO) [id number] Yes Take 1 tablet by mouth daily. [provider] Taking Active Self           Med Note (NEWCOMER Elmer Sow   Tue Jun 27, 2020 12:32 PM)    ferrous gluconate (FERGON) 324 MG tablet [id number] Yes Take 1 tablet (324 mg total) by mouth daily with breakfast. Rai,  Delene Ruffini, MD Taking Active   fluticasone (FLONASE) 50 MCG/ACT nasal spray [id number] Yes Place 2 sprays into both nostrils daily as needed for allergies or rhinitis. [name], MD Taking Active Self  pantoprazole (PROTONIX) 40 MG tablet [id number] Yes Take 1 tablet (40 mg total) by mouth daily. Rai, Delene Ruffini, MD Taking Active   potassium chloride SA (KLOR-CON M) 20 MEQ tablet [id number] Yes Take 1 tablet (20 mEq total) by mouth 2 (two) times daily. Rai, Delene Ruffini, MD Taking Active   rosuvastatin (CRESTOR) 40 MG tablet [id number] Yes Take 1 tablet (40 mg total) by mouth daily.  Patient taking differently: Take 40 mg by mouth every evening.   [name], MD Taking Active Self  torsemide [facility]) 20 MG tablet [id number] Yes Take 4 tablets (80 mg total) by mouth 2 (two) times daily. Soquel, Anderson Malta, FNP Taking Active Self  vitamin C (ASCORBIC ACID) 250 MG tablet [id number] Yes Take 500 mg by mouth daily. [provider] Taking Active Self           Med Note Truddie Coco May 28, 2022  2:57 PM)    VITAMIN D PO [id number] Yes Take 4,000 Units by mouth daily. [provider] Taking Active Self            Home Care and Equipment/Supplies: Were Home Health Services Ordered?: NA Any new equipment or medical supplies ordered?: NA  Functional Questionnaire: Do you need assistance with bathing/showering or dressing?: No Do you need assistance with meal preparation?: Yes Do you need assistance with eating?: No Do you have difficulty maintaining continence: No  Do you need assistance with getting out of bed/getting out of a chair/moving?: No Do you have difficulty managing or taking your medications?: No  Follow up appointments reviewed: PCP Follow-up appointment confirmed?: No MD Provider Line [phone] Given: Yes (Per patient she will call for an appointment. Patient refused RN to assist for appt.) Specialist Hospital Follow-up  appointment confirmed?: No Reason Specialist Follow-Up Not Confirmed: Patient has Specialist Provider Number and will Call for Appointment Do you need transportation to your follow-up appointment?: No Do you understand care options if your condition(s) worsen?: Yes-patient verbalized understanding  SDOH Interventions Today    Flowsheet Row Most Recent Value  SDOH Interventions   Food Insecurity Interventions Intervention Not Indicated  Housing Interventions Intervention Not Indicated  Transportation Interventions Intervention Not Indicated      Interventions Today    Flowsheet Row Most Recent Value  General Interventions   General Interventions Discussed/Reviewed General Interventions Discussed, General Interventions Reviewed, Doctor Visits  Doctor Visits Discussed/Reviewed Doctor Visits Discussed  [Patient wanted to make own appts]      TOC Interventions Today    Flowsheet Row Most Recent Value  TOC Interventions   TOC Interventions Discussed/Reviewed TOC Interventions Discussed, TOC Interventions Reviewed       Gean Maidens BSN RN [facility] [phone]

## 2022-11-14 ENCOUNTER — Other Ambulatory Visit: Payer: Self-pay | Admitting: Hematology

## 2022-11-19 ENCOUNTER — Encounter: Payer: Self-pay | Admitting: Pharmacist

## 2022-11-19 ENCOUNTER — Other Ambulatory Visit: Payer: Self-pay | Admitting: Pharmacist

## 2022-11-19 ENCOUNTER — Inpatient Hospital Stay: Payer: PPO | Admitting: Family Medicine

## 2022-11-19 DIAGNOSIS — D508 Other iron deficiency anemias: Secondary | ICD-10-CM

## 2022-11-19 NOTE — Progress Notes (Signed)
Patient previously followed by [facility] pharmacist. Per clinical review, no pharmacist appointment needed at this time. Patient would benefit from [facility] support as canceled PCP visit due to lack of transportation. Will refer to [facility] team. Care guide directed to contact patient and cancel appointment and notify pharmacy team of any patient concerns.

## 2022-11-20 ENCOUNTER — Telehealth: Payer: Self-pay

## 2022-11-20 ENCOUNTER — Inpatient Hospital Stay: Payer: PPO | Attending: Nurse Practitioner

## 2022-11-20 ENCOUNTER — Inpatient Hospital Stay: Payer: PPO | Admitting: Hematology

## 2022-11-20 ENCOUNTER — Inpatient Hospital Stay: Payer: PPO

## 2022-11-20 ENCOUNTER — Other Ambulatory Visit: Payer: Self-pay

## 2022-11-20 ENCOUNTER — Encounter: Payer: Self-pay | Admitting: Hematology

## 2022-11-20 ENCOUNTER — Telehealth: Payer: Self-pay | Admitting: *Deleted

## 2022-11-20 VITALS — BP 117/49 | HR 60 | Temp 98.1°F | Resp 20 | Wt 137.8 lb

## 2022-11-20 DIAGNOSIS — D638 Anemia in other chronic diseases classified elsewhere: Secondary | ICD-10-CM | POA: Diagnosis not present

## 2022-11-20 DIAGNOSIS — D509 Iron deficiency anemia, unspecified: Secondary | ICD-10-CM | POA: Insufficient documentation

## 2022-11-20 DIAGNOSIS — D649 Anemia, unspecified: Secondary | ICD-10-CM | POA: Diagnosis not present

## 2022-11-20 DIAGNOSIS — D3A8 Other benign neuroendocrine tumors: Secondary | ICD-10-CM

## 2022-11-20 DIAGNOSIS — Z79899 Other long term (current) drug therapy: Secondary | ICD-10-CM | POA: Insufficient documentation

## 2022-11-20 DIAGNOSIS — Z8719 Personal history of other diseases of the digestive system: Secondary | ICD-10-CM

## 2022-11-20 LAB — CBC WITH DIFFERENTIAL (CANCER CENTER ONLY)
Abs Immature Granulocytes: 0.01 10*3/uL (ref 0.00–0.07)
Basophils Absolute: 0 10*3/uL (ref 0.0–0.1)
Basophils Relative: 0 %
Eosinophils Absolute: 0 10*3/uL (ref 0.0–0.5)
Eosinophils Relative: 0 %
HCT: 25.2 % — ABNORMAL LOW (ref 36.0–46.0)
Hemoglobin: 7.4 g/dL — ABNORMAL LOW (ref 12.0–15.0)
Immature Granulocytes: 0 %
Lymphocytes Relative: 12 %
Lymphs Abs: 0.4 10*3/uL — ABNORMAL LOW (ref 0.7–4.0)
MCH: 27.4 pg (ref 26.0–34.0)
MCHC: 29.4 g/dL — ABNORMAL LOW (ref 30.0–36.0)
MCV: 93.3 fL (ref 80.0–100.0)
Monocytes Absolute: 0.4 10*3/uL (ref 0.1–1.0)
Monocytes Relative: 11 %
Neutro Abs: 2.9 10*3/uL (ref 1.7–7.7)
Neutrophils Relative %: 77 %
Platelet Count: 186 10*3/uL (ref 150–400)
RBC: 2.7 MIL/uL — ABNORMAL LOW (ref 3.87–5.11)
RDW: 22.5 % — ABNORMAL HIGH (ref 11.5–15.5)
WBC Count: 3.7 10*3/uL — ABNORMAL LOW (ref 4.0–10.5)
nRBC: 0 % (ref 0.0–0.2)

## 2022-11-20 LAB — CMP (CANCER CENTER ONLY)
ALT: 7 U/L (ref 0–44)
AST: 16 U/L (ref 15–41)
Albumin: 4 g/dL (ref 3.5–5.0)
Alkaline Phosphatase: 46 U/L (ref 38–126)
Anion gap: 7 (ref 5–15)
BUN: 29 mg/dL — ABNORMAL HIGH (ref 8–23)
CO2: 32 mmol/L (ref 22–32)
Calcium: 9.3 mg/dL (ref 8.9–10.3)
Chloride: 102 mmol/L (ref 98–111)
Creatinine: 1.54 mg/dL — ABNORMAL HIGH (ref 0.44–1.00)
GFR, Estimated: 33 mL/min — ABNORMAL LOW (ref >60)
Glucose, Bld: 85 mg/dL (ref 70–99)
Potassium: 4.6 mmol/L (ref 3.5–5.1)
Sodium: 141 mmol/L (ref 135–145)
Total Bilirubin: 0.7 mg/dL (ref 0.3–1.2)
Total Protein: 6.5 g/dL (ref 6.5–8.1)

## 2022-11-20 LAB — IRON AND IRON BINDING CAPACITY (CC-WL,HP ONLY)
Iron: 55 ug/dL (ref 28–170)
Saturation Ratios: 19 % (ref 10.4–31.8)
TIBC: 286 ug/dL (ref 250–450)
UIBC: 231 ug/dL (ref 148–442)

## 2022-11-20 LAB — FERRITIN: Ferritin: 105 ng/mL (ref 11–307)

## 2022-11-20 MED ORDER — OCTREOTIDE ACETATE 20 MG IM KIT
20.0000 mg | PACK | Freq: Once | INTRAMUSCULAR | Status: AC
  Administered 2022-11-20: 20 mg via INTRAMUSCULAR
  Filled 2022-11-20: qty 1

## 2022-11-20 NOTE — Telephone Encounter (Signed)
   Telephone encounter was:  Unsuccessful.  11/20/2022 Name: [name] MRN: [id number] DOB: 01-25-1934  Unsuccessful outbound call made today to assist with:  Transportation Needs   Outreach Attempt:  1st Attempt  Unable to leave message. No answer at cell number  [phone], voicemail not setup at cell number  [phone], voicemail not setup at home number [phone].  Jodie Cavey [facility]  [facility] Population Health Community Resource Care Guide   [email or web address]  ?? [phone]   Website: [email or web address]  [email or web address]

## 2022-11-20 NOTE — Telephone Encounter (Signed)
   Telephone encounter was:  Successful.  11/20/2022 Name: [name] MRN: [id number] DOB: [birth year]  [name] is a 89 y.o. year old female who is a primary care patient of [name], MD . The community resource team was consulted for assistance with Transportation Needs   Care guide performed the following interventions: Patient's daughter answered the phone, patient is unavailable at the [facility] receiving treatment.  Informed her I will be out of the office tomorrow but will call again Friday 11/22/22.   Follow Up Plan:   I will attempt to contact the patient again on Friday 11/22/2022.  Albaraa Swingle [facility]  [facility] Population Health Community Resource Care Guide   [email or web address]  ?? [phone]   Website: [email or web address]  [email or web address]

## 2022-11-20 NOTE — Assessment & Plan Note (Signed)
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   -Outpatient EUS 03/04/2022 by Dr. 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 Freida Busman did not recommend surgery in multifocal NET and also due to her age and co-morbidities. -She began first line sandostatin injections q28 days on 03/27/22.  -Dotatate PET scan, done 2 weeks after first injection 12/6, shows no significant tracer avidity in the pancrease or distant sites. Findings are likely due to close timing relative to the sandostatin injection blocking receptors. When we repeat this for restaging, will arrange 4 weeks after sandostatin

## 2022-11-20 NOTE — Progress Notes (Signed)
  Care Coordination  Outreach Note  11/20/2022 Name: [name] MRN: [id number] DOB: Sep 09, 1933   Care Coordination Outreach Attempts: An unsuccessful telephone outreach was attempted today to offer the patient information about available care coordination services.  Follow Up Plan:  Additional outreach attempts will be made to offer the patient care coordination information and services.   Encounter Outcome:  No Answer  Burman Nieves, CCMA Care Coordination Care Guide Direct Dial: [phone]

## 2022-11-20 NOTE — Progress Notes (Signed)
[facility]   [phone] [phone]   Clinic Follow up Note   Patient Care Team: [name], MD as PCP - General (Family Medicine) Duke Salvia, MD as PCP - Cardiology (Cardiology) Laurey Morale, MD as PCP - Advanced Heart Failure (Cardiology) Antonieta Iba, MD as Consulting Physician (Cardiology) Duke Salvia, MD as Consulting Physician (Cardiology) [name], Latina Craver, MD as Consulting Physician (Vascular Surgery) Creig Hines, MD as Consulting Physician (Hematology and Oncology) Malachy Mood, MD as Consulting Physician (Oncology) Kathyrn Sheriff, [facility] (Inactive) as Pharmacist (Pharmacist)  Date of Service:  11/20/2022  CHIEF COMPLAINT: f/u of  NET of the pancreas    CURRENT THERAPY:   First line Sandostatin injection q28 days, starting 03/27/22   ASSESSMENT:  [name] is a 89 y.o. 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   -Outpatient EUS 03/04/2022 by Dr. 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 Freida Busman did not recommend surgery in multifocal NET and also due to her age and co-morbidities. -She began first line sandostatin injections q28 days on 03/27/22.  -Dotatate PET scan, done 2 weeks after first injection 12/6, shows no significant tracer avidity in the pancrease or distant sites. Findings are likely due to close timing relative to the sandostatin injection blocking receptors. When we repeat this for restaging, will  arrange 4 weeks after sandostatin -She missed her last 2 Sandostatin injections due to shingles, and recent hospital stay, will restart today. -She had CT abdomen pelvis with contrast in the hospital on November 04, 2022, which showed cirrhosis and indeterminate liver lesion, and slightly increased pancreatic mass.  She is not able to do liver MRI due to her pacemaker. -Plan to repeat dotatate PET scan in 2 months   Multifactorial anemia, secondary to GI bleed, h/o IDA and B12 deficiency, and anemia of chronic disease -She has a longstanding history of anemia for at least 10 years, likely more; and history of GI bleeding from AVMs -She has received various formulations of IV iron in the past (ferlecit in 2020, Feraheme in 02/2022), B12 injections (last in 2021), and ESA (Retacrit, 2021) -She received IV iron 500 mg in the hospital, will arrange second dose Feraheme 510 mg this week -She previously had reaction to blood transfusion, or required multiple premedications for blood transfusion -Will repeat her CBC next week, if her hemoglobin is above 8, will hold on blood transfusion  3.  Comorbidities: A-fib, CHF, ICD placement, hypertension, COPD, newly diagnosed liver cirrhosis, CKD -Patient has multiple comorbidities, Recommend her follow-up with her PCP and her other doctors -Due to her advanced age, overall manage approach is palliative, to improve and maintain her quality of life.  PLAN: -lab reviewed hg 7.4 - I recommend 1 dose of IV Feraheme in the next two days -proceed with Sandostatin injection today and continue every months -repeat PET scan in 2 months  -Labs in 1 to 2 weeks -Lab, follow-up and Sandostatin injection in 4 weeks, will order dotatate PET scan on next  visit     INTERVAL HISTORY:  [name] is here for a follow up of  NET of the pancreas.She was last seen by NP Lacie on  She presents to the clinic accompanied by daughter. Pt was hospitalized and she also had  shingles. Pt had 2 units of blood and IV Iron. Pt does not have any major issues with Sandostatin injection. She states that she feels a little fatigue for about 2 days and then she recovers.   All other systems were reviewed with the patient and are negative.  MEDICAL HISTORY:  Past Medical History:  Diagnosis Date   (HFpEF) heart failure with preserved ejection fraction (HCC)    EF=60-60%   Actinic keratosis 01/17/2015   R forearm   Adjustment disorder with anxiety    Adverse effect of other narcotics, sequela    Intolerance to all narcotics   Anti-Duffy antibodies present    Aortic valve stenosis    Arthritis    "some in my hands" (11/11/2012)   Atrial fibrillation, permanent (HCC)    Eliquis   Atypical mole 03/25/2018   L forearm - severe   Automatic implantable cardioverter-defibrillator in situ    Avascular necrosis of hip (HCC) 05/03/2011   Carotid artery stenosis 09/2007   a. 09/2007: 60-79% bilateral (stable); b. 10/2008: 40-59% R 60-79%    Cellulitis of left lower extremity 07/13/2020   CKD (chronic kidney disease), stage III (HCC)    COPD (chronic obstructive pulmonary disease) (HCC)    Coronary artery disease    non-obstructive by 2006 cath   COVID-19 virus infection 02/12/2022   Displaced fracture of left femoral neck (HCC) 10/09/2018   GI bleed 03/28/2020   AVM   High cholesterol    Hypertension 05/20/2011   Hypertr obst cardiomyop    Hypotension, unspecified    cardiac cath 2006..nonobstructive CAD 30-40s lesions.Marland KitchenETT 1/09 nondiagnostic due to poor HR response..Right Renal Cancer 2003   Iron deficiency anemia    Long term (current) use of anticoagulants    On home oxygen therapy    3L [place]   Osteoarthritis of right hip    PAD (peripheral artery disease) (HCC)    Pancreatic cancer (HCC)    sandostatin   PONV (postoperative nausea and vomiting)    Presence of permanent cardiac pacemaker    Pulmonary HTN (HCC)    RECTAL BLEEDING 10/13/2009   Qualifier:  Diagnosis of  By: Wilmon Pali NP, Paula     Red blood cell antibody positive with compatible PRBC difficult to obtain    Anti FYA (Duffy a) antibody. Must be transfused with PRB which are Duffy Antigen Negative and Crossmatch Compatible   Renal cell carcinoma (HCC)    s/p nephrectomy   Squamous cell carcinoma of skin 01/17/2015   R lat wrist   Squamous cell carcinoma of skin 01/29/2018   R post upper leg - superficially invasive   Squamous cell carcinoma of skin 03/25/2018   L lat foot   Squamous cell carcinoma of skin 11/09/2020   left lat foot - EDC 01/01/21, recurrent 01/31/21 - MOHs 02/22/21   Squamous cell carcinoma of skin 12/19/2021   Right Posterior Medial Thigh, EDC   Squamous cell carcinoma of skin 12/19/2021   SCC IS, L lat heel, EDC 02/04/2022   Squamous cell carcinoma of skin 01/21/2022   L forearm, EDC 02/04/2022   Squamous cell carcinoma of skin 01/21/2022   R lower leg below knee, EDC 02/04/2022   Squamous cell carcinoma of skin  01/21/2022   SCCIS, R post heel, EDC 02/04/2022   Squamous cell carcinoma of skin 07/01/2022   left lower abdomen, in situ, EDC   Squamous cell carcinoma of skin 07/01/2022   left medial chest, EDC   Urge incontinence    Venous stasis of both lower extremities     SURGICAL HISTORY: Past Surgical History:  Procedure Laterality Date   ABDOMINAL AORTOGRAM W/LOWER EXTREMITY N/A 02/12/2021   Procedure: ABDOMINAL AORTOGRAM W/LOWER EXTREMITY;  Surgeon: Maeola Harman, MD;  Location: [facility] INVASIVE CV LAB;  Service: Cardiovascular;  Laterality: N/A;   ABDOMINAL HYSTERECTOMY  1975   for benign causes   APPENDECTOMY     BI-VENTRICULAR PACEMAKER UPGRADE  05/04/2010   BIOPSY  02/28/2019   Procedure: BIOPSY;  Surgeon: Rachael Fee, MD;  Location: [facility] ENDOSCOPY;  Service: Endoscopy;;   BIOPSY  03/04/2022   Procedure: BIOPSY;  Surgeon: Lemar Lofty., MD;  Location: [facility] ENDOSCOPY;  Service: Gastroenterology;;   CARDIAC  CATHETERIZATION  2006   CARDIOVERSION N/A 02/20/2018   Procedure: CARDIOVERSION;  Surgeon: Antonieta Iba, MD;  Location: [facility] ORS;  Service: Cardiovascular;  Laterality: N/A;   CARDIOVERSION N/A 03/27/2018   Procedure: CARDIOVERSION;  Surgeon: Antonieta Iba, MD;  Location: [facility] ORS;  Service: Cardiovascular;  Laterality: N/A;   CATARACT EXTRACTION W/ INTRAOCULAR LENS  IMPLANT, BILATERAL  01/2006-02-2006   CHOLECYSTECTOMY N/A 11/11/2012   Procedure: LAPAROSCOPIC CHOLECYSTECTOMY WITH INTRAOPERATIVE CHOLANGIOGRAM;  Surgeon: Wilmon Arms. Corliss Skains, MD;  Location: [facility] OR;  Service: General;  Laterality: N/A;   COLONOSCOPY WITH PROPOFOL N/A 02/28/2019   Procedure: COLONOSCOPY WITH PROPOFOL;  Surgeon: Rachael Fee, MD;  Location: [facility] ENDOSCOPY;  Service: Endoscopy;  Laterality: N/A;   ENTEROSCOPY N/A 03/30/2020   Procedure: ENTEROSCOPY;  Surgeon: Shellia Cleverly, DO;  Location: [facility] ENDOSCOPY;  Service: Gastroenterology;  Laterality: N/A;   ENTEROSCOPY N/A 02/10/2021   Procedure: ENTEROSCOPY;  Surgeon: Jeani Hawking, MD;  Location: [facility] ENDOSCOPY;  Service: Endoscopy;  Laterality: N/A;   ENTEROSCOPY N/A 02/15/2022   Procedure: ENTEROSCOPY;  Surgeon: Imogene Burn, MD;  Location[facility] ENDOSCOPY;  Service: Gastroenterology;  Laterality: N/A;   ENTEROSCOPY N/A 11/06/2022   Procedure: ENTEROSCOPY;  Surgeon: Meridee Score Netty Starring., MD;  Location: [facility] ENDOSCOPY;  Service: Gastroenterology;  Laterality: N/A;   EP IMPLANTABLE DEVICE N/A 02/21/2016   Procedure: ICD Generator Changeout;  Surgeon: Duke Salvia, MD;  Location: [facility] INVASIVE CV LAB;  Service: Cardiovascular;  Laterality: N/A;   ESOPHAGOGASTRODUODENOSCOPY N/A 03/04/2022   Procedure: ESOPHAGOGASTRODUODENOSCOPY (EGD);  Surgeon: Lemar Lofty., MD;  Location: [facility] ENDOSCOPY;  Service: Gastroenterology;  Laterality: N/A;   ESOPHAGOGASTRODUODENOSCOPY (EGD) WITH PROPOFOL N/A 02/28/2019   Procedure: ESOPHAGOGASTRODUODENOSCOPY (EGD) WITH PROPOFOL;   Surgeon: Rachael Fee, MD;  Location: [facility] ENDOSCOPY;  Service: Endoscopy;  Laterality: N/A;   EUS N/A 03/04/2022   Procedure: UPPER ENDOSCOPIC ULTRASOUND (EUS) RADIAL;  Surgeon: Lemar Lofty., MD;  Location: [facility] ENDOSCOPY;  Service: Gastroenterology;  Laterality: N/A;   FINE NEEDLE ASPIRATION N/A 03/04/2022   Procedure: FINE NEEDLE ASPIRATION (FNA) LINEAR;  Surgeon: Lemar Lofty., MD;  Location: [facility] ENDOSCOPY;  Service: Gastroenterology;  Laterality: N/A;   GIVENS CAPSULE STUDY N/A 03/15/2019   Procedure: GIVENS CAPSULE STUDY;  Surgeon: Tressia Danas, MD;  Location: [facility] ENDOSCOPY;  Service: Gastroenterology;  Laterality: N/A;   HOT HEMOSTASIS N/A 03/30/2020   Procedure: HOT HEMOSTASIS (ARGON PLASMA COAGULATION/BICAP);  Surgeon: Shellia Cleverly, DO;  Location: [facility] ENDOSCOPY;  Service: Gastroenterology;  Laterality: N/A;  HOT HEMOSTASIS N/A 02/10/2021   Procedure: HOT HEMOSTASIS (ARGON PLASMA COAGULATION/BICAP);  Surgeon: Jeani Hawking, MD;  Location: [facility] ENDOSCOPY;  Service: Endoscopy;  Laterality: N/A;   HOT HEMOSTASIS N/A 02/15/2022   Procedure: HOT HEMOSTASIS (ARGON PLASMA COAGULATION/BICAP);  Surgeon: Imogene Burn, MD;  Location: [facility] ENDOSCOPY;  Service: Gastroenterology;  Laterality: N/A;   HOT HEMOSTASIS N/A 11/06/2022   Procedure: HOT HEMOSTASIS (ARGON PLASMA COAGULATION/BICAP);  Surgeon: Lemar Lofty., MD;  Location: [facility] ENDOSCOPY;  Service: Gastroenterology;  Laterality: N/A;   INSERT / REPLACE / REMOVE PACEMAKER  05-01-11   02-28-05-/05-04-10-ICD-MEDTRONIC MAXIMAL DR   IR ANGIOGRAM SELECTIVE EACH ADDITIONAL VESSEL  02/18/2022   IR ANGIOGRAM SELECTIVE EACH ADDITIONAL VESSEL  02/18/2022   IR ANGIOGRAM VISCERAL SELECTIVE  02/16/2022   IR ANGIOGRAM VISCERAL SELECTIVE  02/16/2022   IR EMBO ART  VEN HEMORR LYMPH EXTRAV  INC GUIDE ROADMAPPING  02/16/2022   IR THORACENTESIS ASP PLEURAL SPACE W/IMG GUIDE  02/21/2022   IR THORACENTESIS ASP PLEURAL SPACE W/IMG  GUIDE  02/22/2022   IR US GUIDE VASC ACCESS RIGHT  02/16/2022   JOINT REPLACEMENT     LAPAROSCOPIC CHOLECYSTECTOMY  11/11/2012   LAPAROSCOPIC LYSIS OF ADHESIONS N/A 11/11/2012   Procedure: LAPAROSCOPIC LYSIS OF ADHESIONS;  Surgeon: Wilmon Arms. Corliss Skains, MD;  Location: [facility] OR;  Service: General;  Laterality: N/A;   NEPHRECTOMY Right 06/2001    S/P RENAL CELL CANCER   PERIPHERAL VASCULAR INTERVENTION Bilateral 02/12/2021   Procedure: PERIPHERAL VASCULAR INTERVENTION;  Surgeon: Maeola Harman, MD;  Location: [facility] INVASIVE CV LAB;  Service: Cardiovascular;  Laterality: Bilateral;  Iliac artery stents   PRESSURE SENSOR/CARDIOMEMS N/A 02/03/2019   Procedure: PRESSURE SENSOR/CARDIOMEMS;  Surgeon: Laurey Morale, MD;  Location: [facility] INVASIVE CV LAB;  Service: Cardiovascular;  Laterality: N/A;   RIGHT HEART CATH N/A 11/09/2018   Procedure: RIGHT HEART CATH;  Surgeon: Laurey Morale, MD;  Location: [facility] INVASIVE CV LAB;  Service: Cardiovascular;  Laterality: N/A;   RIGHT HEART CATH N/A 03/08/2019   Procedure: RIGHT HEART CATH;  Surgeon: Laurey Morale, MD;  Location: [facility] INVASIVE CV LAB;  Service: Cardiovascular;  Laterality: N/A;   SUBMUCOSAL TATTOO INJECTION  02/28/2019   Procedure: SUBMUCOSAL TATTOO INJECTION;  Surgeon: Rachael Fee, MD;  Location: [facility] ENDOSCOPY;  Service: Endoscopy;;   SUBMUCOSAL TATTOO INJECTION  11/06/2022   Procedure: SUBMUCOSAL TATTOO INJECTION;  Surgeon: Lemar Lofty., MD;  Location: [facility] ENDOSCOPY;  Service: Gastroenterology;;   TOTAL HIP ARTHROPLASTY Right 05/03/2011   Procedure: TOTAL HIP ARTHROPLASTY ANTERIOR APPROACH;  Surgeon: Kathryne Hitch;  Location: [facility] ORS;  Service: Orthopedics;  Laterality: Right;  Removal of Cannulated Screws Right Hip, Right Direct Anterior Hip Replacement   TOTAL HIP ARTHROPLASTY Left 10/09/2018   Procedure: TOTAL HIP ARTHROPLASTY ANTERIOR APPROACH;  Surgeon: Samson Frederic, MD;  Location: [facility] OR;  Service: Orthopedics;  Laterality:  Left;    I have reviewed the social history and family history with the patient and they are unchanged from previous note.  ALLERGIES:  has No Known Allergies.  MEDICATIONS:  Current Outpatient Medications  Medication Sig Dispense Refill   acetaminophen (TYLENOL) 500 MG tablet Take 1,000 mg by mouth every 8 (eight) hours as needed for mild pain or headache.     apixaban (ELIQUIS) 2.5 MG TABS tablet Take 1 tablet (2.5 mg total) by mouth 2 (two) times daily. 60 tablet 11   Biotin 1000 MCG CHEW Chew 2 1e11 Vector Genomes by mouth daily.     Cyanocobalamin (  B-12 PO) Take 1 tablet by mouth daily.     ferrous gluconate (FERGON) 324 MG tablet Take 1 tablet (324 mg total) by mouth daily with breakfast. 30 tablet 3   fluticasone (FLONASE) 50 MCG/ACT nasal spray Place 2 sprays into both nostrils daily as needed for allergies or rhinitis. 16 g 0   pantoprazole (PROTONIX) 40 MG tablet Take 1 tablet (40 mg total) by mouth daily. 30 tablet 3   potassium chloride SA (KLOR-CON M) 20 MEQ tablet Take 1 tablet (20 mEq total) by mouth 2 (two) times daily.     rosuvastatin (CRESTOR) 40 MG tablet Take 1 tablet (40 mg total) by mouth daily. (Patient taking differently: Take 40 mg by mouth every evening.) 30 tablet 11   torsemide (DEMADEX) 20 MG tablet Take 4 tablets (80 mg total) by mouth 2 (two) times daily. 240 tablet 6   vitamin C (ASCORBIC ACID) 250 MG tablet Take 500 mg by mouth daily.     VITAMIN D PO Take 4,000 Units by mouth daily.     No current facility-administered medications for this visit.    PHYSICAL EXAMINATION: ECOG PERFORMANCE STATUS: 2 - Symptomatic, <50% confined to bed  Vitals:   11/20/22 1314  BP: (!) 117/49  Pulse: 60  Resp: 20  Temp: 98.1 F (36.7 C)  SpO2: 100%   Wt Readings from Last 3 Encounters:  11/20/22 137 lb 12.8 oz (62.5 kg)  11/06/22 137 lb 12.6 oz (62.5 kg)  10/31/22 138 lb (62.6 kg)     GENERAL:alert, no distress and comfortable SKIN: skin color normal, no  rashes or significant lesions EYES: normal, Conjunctiva are pink and non-injected, sclera clear  NEURO: alert & oriented x 3 with fluent speech   LABORATORY DATA:  I have reviewed the data as listed    Latest Ref Rng & Units 11/20/2022   12:48 PM 11/09/2022   12:17 AM 11/08/2022   12:27 AM  CBC  WBC 4.0 - 10.5 K/uL 3.7  4.2  5.3   Hemoglobin 12.0 - 15.0 g/dL 7.4  8.2  8.1   Hematocrit 36.0 - 46.0 % 25.2  27.0  26.9   Platelets 150 - 400 K/uL 186  176  182         Latest Ref Rng & Units 11/20/2022   12:48 PM 11/09/2022   12:17 AM 11/08/2022   12:27 AM  CMP  Glucose 70 - 99 mg/dL 85  284  132   BUN 8 - 23 mg/dL 29  35  35   Creatinine 0.44 - 1.00 mg/dL 4.40  1.02  7.25   Sodium 135 - 145 mmol/L 141  139  138   Potassium 3.5 - 5.1 mmol/L 4.6  3.7  3.6   Chloride 98 - 111 mmol/L 102  100  102   CO2 22 - 32 mmol/L 32  29  26   Calcium 8.9 - 10.3 mg/dL 9.3  8.8  8.5   Total Protein 6.5 - 8.1 g/dL 6.5     Total Bilirubin 0.3 - 1.2 mg/dL 0.7     Alkaline Phos 38 - 126 U/L 46     AST 15 - 41 U/L 16     ALT 0 - 44 U/L 7         RADIOGRAPHIC STUDIES: I have personally reviewed the radiological images as listed and agreed with the findings in the report. No results found.    No orders of the defined types were placed in this  encounter.  All questions were answered. The patient knows to call the clinic with any problems, questions or concerns. No barriers to learning was detected. The total time spent in the appointment was 30 minutes.     Malachy Mood, MD 11/20/2022   Carolin Coy, CMA, am acting as scribe for Malachy Mood, MD.   I have reviewed the above documentation for accuracy and completeness, and I agree with the above.

## 2022-11-21 ENCOUNTER — Encounter: Payer: PPO | Admitting: Pharmacist

## 2022-11-21 ENCOUNTER — Ambulatory Visit: Payer: PPO

## 2022-11-21 NOTE — Progress Notes (Signed)
  Care Coordination  Outreach Note  11/21/2022 Name: [name] MRN: [id number] DOB: December 10, 1933   Care Coordination Outreach Attempts: A second unsuccessful outreach was attempted today to offer the patient with information about available care coordination services.  Follow Up Plan:  Additional outreach attempts will be made to offer the patient care coordination information and services.   Encounter Outcome:  Pt. Request to Call Back  Burman Nieves, [facility] Care Coordination Care Guide Direct Dial: [phone]

## 2022-11-22 ENCOUNTER — Telehealth: Payer: Self-pay

## 2022-11-22 ENCOUNTER — Inpatient Hospital Stay: Payer: PPO

## 2022-11-22 ENCOUNTER — Ambulatory Visit: Payer: PPO

## 2022-11-22 VITALS — BP 143/59 | HR 59 | Temp 97.6°F | Resp 18

## 2022-11-22 DIAGNOSIS — D649 Anemia, unspecified: Secondary | ICD-10-CM

## 2022-11-22 DIAGNOSIS — K922 Gastrointestinal hemorrhage, unspecified: Secondary | ICD-10-CM | POA: Diagnosis not present

## 2022-11-22 LAB — CHROMOGRANIN A: Chromogranin A (ng/mL): 420.6 ng/mL — ABNORMAL HIGH (ref 0.0–101.8)

## 2022-11-22 MED ORDER — SODIUM CHLORIDE 0.9 % IV SOLN
510.0000 mg | Freq: Once | INTRAVENOUS | Status: AC
  Administered 2022-11-22: 510 mg via INTRAVENOUS
  Filled 2022-11-22: qty 510

## 2022-11-22 MED ORDER — SODIUM CHLORIDE 0.9 % IV SOLN: Freq: Once | INTRAVENOUS | Status: AC

## 2022-11-22 NOTE — Patient Instructions (Signed)
Ferumoxytol Injection What is this medication? FERUMOXYTOL (FER ue MOX i tol) treats low levels of iron in your body (iron deficiency anemia). Iron is a mineral that plays an important role in making red blood cells, which carry oxygen from your lungs to the rest of your body. This medicine may be used for other purposes; ask your health care provider or pharmacist if you have questions. COMMON BRAND NAME(S): Feraheme What should I tell my care team before I take this medication? They need to know if you have any of these conditions: Anemia not caused by low iron levels High levels of iron in the blood Magnetic resonance imaging (MRI) test scheduled An unusual or allergic reaction to iron, other medications, foods, dyes, or preservatives Pregnant or trying to get pregnant Breastfeeding How should I use this medication? This medication is injected into a vein. It is given by your care team in a hospital or clinic setting. Talk to your care team the use of this medication in children. Special care may be needed. Overdosage: If you think you have taken too much of this medicine contact a poison control center or emergency room at once. NOTE: This medicine is only for you. Do not share this medicine with others. What if I miss a dose? It is important not to miss your dose. Call your care team if you are unable to keep an appointment. What may interact with this medication? Other iron products This list may not describe all possible interactions. Give your health care provider a list of all the medicines, herbs, non-prescription drugs, or dietary supplements you use. Also tell them if you smoke, drink alcohol, or use illegal drugs. Some items may interact with your medicine. What should I watch for while using this medication? Visit your care team regularly. Tell your care team if your symptoms do not start to get better or if they get worse. You may need blood work done while you are taking this  medication. You may need to follow a special diet. Talk to your care team. Foods that contain iron include: whole grains/cereals, dried fruits, beans, or peas, leafy green vegetables, and organ meats (liver, kidney). What side effects may I notice from receiving this medication? Side effects that you should report to your care team as soon as possible: Allergic reactions--skin rash, itching, hives, swelling of the face, lips, tongue, or throat Low blood pressure--dizziness, feeling faint or lightheaded, blurry vision Shortness of breath Side effects that usually do not require medical attention (report to your care team if they continue or are bothersome): Flushing Headache Joint pain Muscle pain Nausea Pain, redness, or irritation at injection site This list may not describe all possible side effects. Call your doctor for medical advice about side effects. You may report side effects to FDA at 1-800-FDA-1088. Where should I keep my medication? This medication is given in a hospital or clinic. It will not be stored at home. NOTE: This sheet is a summary. It may not cover all possible information. If you have questions about this medicine, talk to your doctor, pharmacist, or health care provider.  2024 Elsevier/Gold Standard (2022-09-27 00:00:00)

## 2022-11-22 NOTE — Telephone Encounter (Signed)
   Telephone encounter was:  Successful.  11/22/2022 Name: [name] MRN: [id number] DOB: 05-05-1934  [name] is a 89 y.o. year old female who is a primary care patient of [name], MD . The community resource team was consulted for assistance with Transportation Needs   Care guide performed the following interventions: Spoke with patient's daughter Lynden Ang she is unsure who communicated that transportation is needed but the family has not asked for assistance.  She does not want to receive any more calls regarding assistance.  Follow Up Plan:  No further follow up planned at this time. The patient has been provided with needed resources.  Marcea Rojek [facility]  [name] Population Health Community Resource Care Guide   [email or web address]  ?? [phone]   Website: [email or web address]  [email or web address]

## 2022-11-25 ENCOUNTER — Other Ambulatory Visit: Payer: Self-pay

## 2022-11-25 ENCOUNTER — Inpatient Hospital Stay (HOSPITAL_COMMUNITY)
Admission: EM | Admit: 2022-11-25 | Discharge: 2022-11-26 | DRG: 378 | Disposition: A | Discharge status: Home / Self Care | Payer: PPO | Attending: Internal Medicine | Admitting: Internal Medicine

## 2022-11-25 ENCOUNTER — Encounter (HOSPITAL_COMMUNITY): Payer: Self-pay

## 2022-11-25 ENCOUNTER — Telehealth (HOSPITAL_COMMUNITY): Payer: Self-pay

## 2022-11-25 DIAGNOSIS — E785 Hyperlipidemia, unspecified: Secondary | ICD-10-CM | POA: Diagnosis present

## 2022-11-25 DIAGNOSIS — C649 Malignant neoplasm of unspecified kidney, except renal pelvis: Secondary | ICD-10-CM | POA: Insufficient documentation

## 2022-11-25 DIAGNOSIS — I482 Chronic atrial fibrillation, unspecified: Secondary | ICD-10-CM | POA: Diagnosis not present

## 2022-11-25 DIAGNOSIS — Z90710 Acquired absence of both cervix and uterus: Secondary | ICD-10-CM

## 2022-11-25 DIAGNOSIS — J9611 Chronic respiratory failure with hypoxia: Secondary | ICD-10-CM | POA: Insufficient documentation

## 2022-11-25 DIAGNOSIS — K5520 Angiodysplasia of colon without hemorrhage: Secondary | ICD-10-CM | POA: Diagnosis not present

## 2022-11-25 DIAGNOSIS — Z9841 Cataract extraction status, right eye: Secondary | ICD-10-CM

## 2022-11-25 DIAGNOSIS — Z96643 Presence of artificial hip joint, bilateral: Secondary | ICD-10-CM | POA: Diagnosis not present

## 2022-11-25 DIAGNOSIS — J449 Chronic obstructive pulmonary disease, unspecified: Secondary | ICD-10-CM | POA: Diagnosis present

## 2022-11-25 DIAGNOSIS — Z8249 Family history of ischemic heart disease and other diseases of the circulatory system: Secondary | ICD-10-CM

## 2022-11-25 DIAGNOSIS — I4821 Permanent atrial fibrillation: Secondary | ICD-10-CM | POA: Diagnosis not present

## 2022-11-25 DIAGNOSIS — Z85828 Personal history of other malignant neoplasm of skin: Secondary | ICD-10-CM

## 2022-11-25 DIAGNOSIS — D72819 Decreased white blood cell count, unspecified: Secondary | ICD-10-CM | POA: Diagnosis present

## 2022-11-25 DIAGNOSIS — D62 Acute posthemorrhagic anemia: Secondary | ICD-10-CM | POA: Diagnosis present

## 2022-11-25 DIAGNOSIS — D3A8 Other benign neuroendocrine tumors: Secondary | ICD-10-CM | POA: Diagnosis not present

## 2022-11-25 DIAGNOSIS — I5032 Chronic diastolic (congestive) heart failure: Secondary | ICD-10-CM | POA: Diagnosis not present

## 2022-11-25 DIAGNOSIS — Z961 Presence of intraocular lens: Secondary | ICD-10-CM | POA: Diagnosis not present

## 2022-11-25 DIAGNOSIS — N189 Chronic kidney disease, unspecified: Secondary | ICD-10-CM | POA: Diagnosis present

## 2022-11-25 DIAGNOSIS — I509 Heart failure, unspecified: Secondary | ICD-10-CM

## 2022-11-25 DIAGNOSIS — D649 Anemia, unspecified: Secondary | ICD-10-CM | POA: Diagnosis present

## 2022-11-25 DIAGNOSIS — I5033 Acute on chronic diastolic (congestive) heart failure: Secondary | ICD-10-CM | POA: Diagnosis not present

## 2022-11-25 DIAGNOSIS — Z8719 Personal history of other diseases of the digestive system: Secondary | ICD-10-CM | POA: Diagnosis not present

## 2022-11-25 DIAGNOSIS — Z87891 Personal history of nicotine dependence: Secondary | ICD-10-CM

## 2022-11-25 DIAGNOSIS — E039 Hypothyroidism, unspecified: Secondary | ICD-10-CM | POA: Diagnosis not present

## 2022-11-25 DIAGNOSIS — I130 Hypertensive heart and chronic kidney disease with heart failure and stage 1 through stage 4 chronic kidney disease, or unspecified chronic kidney disease: Secondary | ICD-10-CM | POA: Diagnosis not present

## 2022-11-25 DIAGNOSIS — T8092XS Unspecified transfusion reaction, sequela: Secondary | ICD-10-CM | POA: Diagnosis not present

## 2022-11-25 DIAGNOSIS — E7800 Pure hypercholesterolemia, unspecified: Secondary | ICD-10-CM | POA: Diagnosis not present

## 2022-11-25 DIAGNOSIS — I5082 Biventricular heart failure: Secondary | ICD-10-CM | POA: Diagnosis present

## 2022-11-25 DIAGNOSIS — Z79899 Other long term (current) drug therapy: Secondary | ICD-10-CM

## 2022-11-25 DIAGNOSIS — N1832 Chronic kidney disease, stage 3b: Secondary | ICD-10-CM | POA: Diagnosis present

## 2022-11-25 DIAGNOSIS — Z8507 Personal history of malignant neoplasm of pancreas: Secondary | ICD-10-CM

## 2022-11-25 DIAGNOSIS — G8929 Other chronic pain: Secondary | ICD-10-CM | POA: Diagnosis present

## 2022-11-25 DIAGNOSIS — Z66 Do not resuscitate: Secondary | ICD-10-CM | POA: Diagnosis not present

## 2022-11-25 DIAGNOSIS — Z95810 Presence of automatic (implantable) cardiac defibrillator: Secondary | ICD-10-CM

## 2022-11-25 DIAGNOSIS — N183 Chronic kidney disease, stage 3 unspecified: Secondary | ICD-10-CM | POA: Diagnosis present

## 2022-11-25 DIAGNOSIS — I739 Peripheral vascular disease, unspecified: Secondary | ICD-10-CM | POA: Diagnosis not present

## 2022-11-25 DIAGNOSIS — K921 Melena: Secondary | ICD-10-CM | POA: Diagnosis present

## 2022-11-25 DIAGNOSIS — K5900 Constipation, unspecified: Secondary | ICD-10-CM | POA: Diagnosis present

## 2022-11-25 DIAGNOSIS — Z8616 Personal history of COVID-19: Secondary | ICD-10-CM | POA: Diagnosis not present

## 2022-11-25 DIAGNOSIS — Z9842 Cataract extraction status, left eye: Secondary | ICD-10-CM

## 2022-11-25 DIAGNOSIS — D509 Iron deficiency anemia, unspecified: Secondary | ICD-10-CM | POA: Diagnosis present

## 2022-11-25 DIAGNOSIS — I422 Other hypertrophic cardiomyopathy: Secondary | ICD-10-CM

## 2022-11-25 DIAGNOSIS — K922 Gastrointestinal hemorrhage, unspecified: Principal | ICD-10-CM | POA: Diagnosis present

## 2022-11-25 DIAGNOSIS — T8092XA Unspecified transfusion reaction, initial encounter: Secondary | ICD-10-CM | POA: Insufficient documentation

## 2022-11-25 DIAGNOSIS — N179 Acute kidney failure, unspecified: Secondary | ICD-10-CM | POA: Diagnosis present

## 2022-11-25 DIAGNOSIS — I428 Other cardiomyopathies: Secondary | ICD-10-CM

## 2022-11-25 DIAGNOSIS — Z7901 Long term (current) use of anticoagulants: Secondary | ICD-10-CM

## 2022-11-25 DIAGNOSIS — Z9049 Acquired absence of other specified parts of digestive tract: Secondary | ICD-10-CM

## 2022-11-25 DIAGNOSIS — I2510 Atherosclerotic heart disease of native coronary artery without angina pectoris: Secondary | ICD-10-CM | POA: Diagnosis present

## 2022-11-25 DIAGNOSIS — Z85528 Personal history of other malignant neoplasm of kidney: Secondary | ICD-10-CM

## 2022-11-25 DIAGNOSIS — Z905 Acquired absence of kidney: Secondary | ICD-10-CM

## 2022-11-25 LAB — BPAM RBC
Blood Product Expiration Date: 202408172359
ISSUE DATE / TIME: 202407220636

## 2022-11-25 LAB — HEMOGLOBIN AND HEMATOCRIT, BLOOD
HCT: 23.6 % — ABNORMAL LOW (ref 36.0–46.0)
HCT: 27.5 % — ABNORMAL LOW (ref 36.0–46.0)
HCT: 27.8 % — ABNORMAL LOW (ref 36.0–46.0)
Hemoglobin: 6.8 g/dL — CL (ref 12.0–15.0)
Hemoglobin: 7.9 g/dL — ABNORMAL LOW (ref 12.0–15.0)
Hemoglobin: 8.2 g/dL — ABNORMAL LOW (ref 12.0–15.0)

## 2022-11-25 LAB — TYPE AND SCREEN
Antibody Screen: POSITIVE
Unit division: 0
Unit division: 0

## 2022-11-25 LAB — IRON AND TIBC
Iron: 93 ug/dL (ref 28–170)
Saturation Ratios: 30 % (ref 10.4–31.8)
TIBC: 308 ug/dL (ref 250–450)
UIBC: 215 ug/dL

## 2022-11-25 LAB — COMPREHENSIVE METABOLIC PANEL
ALT: 10 U/L (ref 0–44)
AST: 19 U/L (ref 15–41)
Albumin: 3.7 g/dL (ref 3.5–5.0)
Alkaline Phosphatase: 40 U/L (ref 38–126)
Anion gap: 9 (ref 5–15)
BUN: 29 mg/dL — ABNORMAL HIGH (ref 8–23)
CO2: 31 mmol/L (ref 22–32)
Calcium: 9 mg/dL (ref 8.9–10.3)
Chloride: 99 mmol/L (ref 98–111)
Creatinine, Ser: 1.51 mg/dL — ABNORMAL HIGH (ref 0.44–1.00)
GFR, Estimated: 33 mL/min — ABNORMAL LOW (ref >60)
Glucose, Bld: 116 mg/dL — ABNORMAL HIGH (ref 70–99)
Potassium: 3.9 mmol/L (ref 3.5–5.1)
Sodium: 139 mmol/L (ref 135–145)
Total Bilirubin: 0.7 mg/dL (ref 0.3–1.2)
Total Protein: 6.6 g/dL (ref 6.5–8.1)

## 2022-11-25 LAB — PREPARE RBC (CROSSMATCH)

## 2022-11-25 LAB — CBC
HCT: 24.2 % — ABNORMAL LOW (ref 36.0–46.0)
Hemoglobin: 6.9 g/dL — CL (ref 12.0–15.0)
MCH: 28.5 pg (ref 26.0–34.0)
MCHC: 28.5 g/dL — ABNORMAL LOW (ref 30.0–36.0)
MCV: 100 fL (ref 80.0–100.0)
Platelets: 193 10*3/uL (ref 150–400)
RBC: 2.42 MIL/uL — ABNORMAL LOW (ref 3.87–5.11)
RDW: 22.8 % — ABNORMAL HIGH (ref 11.5–15.5)
WBC: 3.6 10*3/uL — ABNORMAL LOW (ref 4.0–10.5)
nRBC: 0 % (ref 0.0–0.2)

## 2022-11-25 LAB — FOLATE: Folate: 29.7 ng/mL (ref >5.9)

## 2022-11-25 LAB — FERRITIN: Ferritin: 287 ng/mL (ref 11–307)

## 2022-11-25 LAB — VITAMIN B12: Vitamin B-12: 603 pg/mL (ref 180–914)

## 2022-11-25 MED ORDER — SODIUM CHLORIDE 0.9 % IV SOLN: 250.0000 mL | INTRAVENOUS | Status: DC | PRN

## 2022-11-25 MED ORDER — SODIUM CHLORIDE 0.9% IV SOLUTION: Freq: Once | INTRAVENOUS | Status: DC

## 2022-11-25 MED ORDER — FERROUS GLUCONATE 324 (38 FE) MG PO TABS
324.0000 mg | ORAL_TABLET | Freq: Every day | ORAL | Status: DC
  Administered 2022-11-25 – 2022-11-26 (×2): 324 mg via ORAL
  Filled 2022-11-25 (×3): qty 1

## 2022-11-25 MED ORDER — SODIUM CHLORIDE 0.9% IV SOLUTION: Freq: Once | INTRAVENOUS | Status: AC

## 2022-11-25 MED ORDER — ACETAMINOPHEN 500 MG PO TABS
1000.0000 mg | ORAL_TABLET | Freq: Once | ORAL | Status: AC | PRN
  Administered 2022-11-25: 1000 mg via ORAL
  Filled 2022-11-25: qty 2

## 2022-11-25 MED ORDER — SODIUM CHLORIDE 0.9% FLUSH
3.0000 mL | INTRAVENOUS | Status: DC | PRN
  Administered 2022-11-25: 30 mL via INTRAVENOUS
  Administered 2022-11-25: 3 mL via INTRAVENOUS

## 2022-11-25 MED ORDER — B-12 100 MCG PO TABS: ORAL_TABLET | Freq: Every day | ORAL | Status: DC

## 2022-11-25 MED ORDER — BIOTIN 1000 MCG PO CHEW: 2.0000 | CHEWABLE_TABLET | Freq: Every day | ORAL | Status: DC

## 2022-11-25 MED ORDER — ROSUVASTATIN CALCIUM 20 MG PO TABS
40.0000 mg | ORAL_TABLET | Freq: Every evening | ORAL | Status: DC
  Administered 2022-11-25: 40 mg via ORAL
  Filled 2022-11-25: qty 2

## 2022-11-25 MED ORDER — VITAMIN C 500 MG PO TABS
500.0000 mg | ORAL_TABLET | Freq: Every day | ORAL | Status: DC
  Administered 2022-11-25 – 2022-11-26 (×2): 500 mg via ORAL
  Filled 2022-11-25 (×2): qty 1

## 2022-11-25 MED ORDER — PANTOPRAZOLE SODIUM 40 MG IV SOLR
40.0000 mg | Freq: Two times a day (BID) | INTRAVENOUS | Status: DC
  Administered 2022-11-25 – 2022-11-26 (×3): 40 mg via INTRAVENOUS
  Filled 2022-11-25 (×3): qty 10

## 2022-11-25 MED ORDER — METHYLPREDNISOLONE SODIUM SUCC 125 MG IJ SOLR
125.0000 mg | Freq: Once | INTRAMUSCULAR | Status: AC | PRN
  Administered 2022-11-25: 125 mg via INTRAVENOUS
  Filled 2022-11-25: qty 2

## 2022-11-25 MED ORDER — DEXTROSE IN LACTATED RINGERS 5 % IV SOLN: INTRAVENOUS | Status: AC

## 2022-11-25 MED ORDER — HYDRALAZINE HCL 20 MG/ML IJ SOLN: 10.0000 mg | Freq: Three times a day (TID) | INTRAMUSCULAR | Status: DC | PRN

## 2022-11-25 MED ORDER — DIPHENHYDRAMINE HCL 50 MG/ML IJ SOLN
25.0000 mg | Freq: Once | INTRAMUSCULAR | Status: AC | PRN
  Administered 2022-11-25: 25 mg via INTRAVENOUS
  Filled 2022-11-25: qty 1

## 2022-11-25 MED ORDER — SODIUM CHLORIDE 0.9% FLUSH
3.0000 mL | Freq: Two times a day (BID) | INTRAVENOUS | Status: DC
  Administered 2022-11-25 – 2022-11-26 (×3): 3 mL via INTRAVENOUS

## 2022-11-25 MED ORDER — MORPHINE SULFATE (PF) 2 MG/ML IV SOLN: 2.0000 mg | INTRAVENOUS | Status: DC | PRN

## 2022-11-25 MED ORDER — ONDANSETRON HCL 4 MG/2ML IJ SOLN: 4.0000 mg | Freq: Four times a day (QID) | INTRAMUSCULAR | Status: DC | PRN

## 2022-11-25 MED ORDER — ONDANSETRON HCL 4 MG PO TABS: 4.0000 mg | ORAL_TABLET | Freq: Four times a day (QID) | ORAL | Status: DC | PRN

## 2022-11-25 MED ORDER — ACETAMINOPHEN 325 MG PO TABS: 650.0000 mg | ORAL_TABLET | Freq: Four times a day (QID) | ORAL | Status: DC | PRN

## 2022-11-25 MED ORDER — PANTOPRAZOLE 80MG IVPB - SIMPLE MED
80.0000 mg | Freq: Once | INTRAVENOUS | Status: AC
  Administered 2022-11-25: 80 mg via INTRAVENOUS
  Filled 2022-11-25: qty 100

## 2022-11-25 MED ORDER — ACETAMINOPHEN 650 MG RE SUPP: 650.0000 mg | Freq: Four times a day (QID) | RECTAL | Status: DC | PRN

## 2022-11-25 NOTE — ED Triage Notes (Signed)
Pt arrived from home via POV c/o feeling very fatigued. Pt states that she has had multiple black stools and believes that she has a GI bleed again. Pt hgb when seen at [facility] a few days ago was 7.1.

## 2022-11-25 NOTE — Progress Notes (Signed)
  DAY ZERO NOTE    [name]  YNW:[id number] DOB: [birth year] DOA: 11/25/2022 PCP: [name] Boyden, MD   Brief Narrative:   Patient admitted earlier this morning, see HPI by Dr. Janalyn Shy for complete history and plan.  Briefly [name] with a complex medical history presents with acute GI bleed and symptomatic anemia in the setting of diverticulosis on Eliquis for A-fib.  GI has been consulted given she was just recently admitted here for similar episode with quite extensive workup revealing an angiodysplasia requiring ablation.  At this time we will hold off on any aggressive intervention or treatment, hemoglobin improved after transfusion this morning.  No further episodes of bleeding at this time.  Will continue to monitor hemoglobin symptoms, disposition likely pending resolution of bleeding.  Assessment & Plan:   Principal Problem:   Lower GI bleed Active Problems:   Primary pancreatic neuroendocrine tumor   Acute on chronic anemia   Hyperlipidemia   Hypertrophic cardiomyopathy (HCC)   CKD stage 3b, GFR 30-44 ml/min (HCC)   PAD (peripheral artery disease) (HCC)   Chronic atrial fibrillation with RVR (HCC)   CHF (congestive heart failure) (HCC)   History of gastritis   Angiodysplasia of small intestine   Chronic hypoxic respiratory failure (HCC)   Blood transfusion reaction   Renal cell carcinoma s/p nephrectomy 2003(HCC)   GI bleed   [name] Fallen, DO [facility]  If 7PM-7AM, please contact night-coverage [email or web address]  11/25/2022, 11:54 AM

## 2022-11-25 NOTE — H&P (Addendum)
History and Physical    [name] ZOX:[id number] DOB: January 27, 1934 DOA: 11/25/2022  PCP: [name] Boyden, MD   Patient coming from: Home   Chief Complaint:  Chief Complaint  Patient presents with   Fatigue   GI Bleeding    HPI:  [name] is a 89 y.o. female with medical history significant of pancreatic neuroendocrine tumor managing with Sandostatin injections, permanent A-fib on Eliquis, chronic hypoxic respiratory failure on 2 L O2 via [place] continuously, carotid artery stenosis, PAD, HTN, HLP, chronic HFpEF, hypothyroidism, CKD 3B, recent herpes zoster infection, history of blood transfusion reaction, renal cell carcinoma status post nephrectomy in 2003, history of angiodysplasia of the stomach and duodenum/gastritis on daily PPI presented to emergency department with complaining of dark stool per rectum.  Patient reported she noticed 1 episode of black tarry stool in the afternoon and she got very worried about recurrent development of GI bleed.  Reported taking last Eliquis on 11/23/2022 night.  Patient reporting chronic abdominal pain.  Denies any nausea, vomiting and diarrhea.  She is endorsing constipation as she is on iron pill p.o. and denies any fever or chills.  She is endorsing generalized fatigue and dizziness.  Denies any chest pain, palpitation, headache, change of appetite and weight.   Per chart review patient has recent hospital admission 7/1 to 7/6 due to melena for 2 weeks.  She found to have low hemoglobin 6.8.  Status post unit of blood transfusion.  CTA abdomen pelvis negative for any acute GI bleed, cirrhosis with indeterminate liver lesion.  Underwent enteroscopy 7/3 no gross lesion in the majority of esophagus, LA grade a esophagitis with no bleeding at the distal esophagus and GE junction, 2 angiodysplastic lesions with no bleeding in third and fourth portion of duodenum. 3 nonbleeding angioplastic lesions in proximal jejunum, fulguration to ablate the  lesion to prevent bleeding by argon plasma was successful.  Patient has been to IV Protonix, IV iron and Eliquis was hold for 72 hours.  And discharged with oral Protonix.  GI recommended if patient develop recurrent iron deficiency anemia she will need outpatient colonoscopy +/- capsule endoscopy.  During this admission patient also found to have CHF exacerbation managed with IV Lasix and upon discharge prescribed torsemide   ED Course:  At ED initial presentation heart rate 60, respiratory 16, blood pressure 131/87 and O2 sat 100% room air. CMP sodium 139, potassium 3.9, chloride 99, bicarb 31, blood glucose 116, BUN 29, creatinine 1.5 CBC showed WBC 3.6, RBC 2.82, hemoglobin 6.9 (7.4 5 days ago and baseline around 8-9), low hematocrit 24, MCV 100, platelet 193.  With concern for recurrent GI bleed from angiodysplasia and low hemoglobin in the ED patient being started with blood transfusion , pretreating with Tylenol, Benadryl and Solu-Medrol.  ED physician Dr. Clayborne Dana consult did not reached out to [facility] Dr. Russella Dar.  Waiting for recommendation.  Review of Systems:  Review of Systems  Constitutional:  Positive for malaise/fatigue. Negative for chills, fever and weight loss.  Respiratory:  Negative for hemoptysis.   Cardiovascular:  Negative for chest pain, palpitations, claudication and leg swelling.  Gastrointestinal:  Positive for constipation and melena. Negative for abdominal pain, blood in stool, diarrhea, heartburn, nausea and vomiting.  Genitourinary:  Negative for dysuria and urgency.  Musculoskeletal:  Negative for myalgias.  Neurological:  Positive for dizziness. Negative for tremors and headaches.  Psychiatric/Behavioral:  The patient is not nervous/anxious.     Past Medical History:  Diagnosis Date   (  HFpEF) heart failure with preserved ejection fraction (HCC)    EF=60-60%   Actinic keratosis 01/17/2015   R forearm   Adjustment disorder with anxiety    Adverse effect  of other narcotics, sequela    Intolerance to all narcotics   Anti-Duffy antibodies present    Aortic valve stenosis    Arthritis    "some in my hands" (11/11/2012)   Atrial fibrillation, permanent (HCC)    Eliquis   Atypical mole 03/25/2018   L forearm - severe   Automatic implantable cardioverter-defibrillator in situ    Avascular necrosis of hip (HCC) 05/03/2011   Carotid artery stenosis 09/2007   a. 09/2007: 60-79% bilateral (stable); b. 10/2008: 40-59% R 60-79%    Cellulitis of left lower extremity 07/13/2020   CKD (chronic kidney disease), stage III (HCC)    COPD (chronic obstructive pulmonary disease) (HCC)    Coronary artery disease    non-obstructive by 2006 cath   COVID-19 virus infection 02/12/2022   Displaced fracture of left femoral neck (HCC) 10/09/2018   GI bleed 03/28/2020   AVM   High cholesterol    Hypertension 05/20/2011   Hypertr obst cardiomyop    Hypotension, unspecified    cardiac cath 2006..nonobstructive CAD 30-40s lesions.Marland KitchenETT 1/09 nondiagnostic due to poor HR response..Right Renal Cancer 2003   Iron deficiency anemia    Long term (current) use of anticoagulants    On home oxygen therapy    3L [place]   Osteoarthritis of right hip    PAD (peripheral artery disease) (HCC)    Pancreatic cancer (HCC)    sandostatin   PONV (postoperative nausea and vomiting)    Presence of permanent cardiac pacemaker    Pulmonary HTN (HCC)    RECTAL BLEEDING 10/13/2009   Qualifier: Diagnosis of  By: Wilmon Pali NP, Paula     Red blood cell antibody positive with compatible PRBC difficult to obtain    Anti FYA (Duffy a) antibody. Must be transfused with PRB which are Duffy Antigen Negative and Crossmatch Compatible   Renal cell carcinoma (HCC)    s/p nephrectomy   Squamous cell carcinoma of skin 01/17/2015   R lat wrist   Squamous cell carcinoma of skin 01/29/2018   R post upper leg - superficially invasive   Squamous cell carcinoma of skin 03/25/2018   L lat foot    Squamous cell carcinoma of skin 11/09/2020   left lat foot - EDC 01/01/21, recurrent 01/31/21 - MOHs 02/22/21   Squamous cell carcinoma of skin 12/19/2021   Right Posterior Medial Thigh, EDC   Squamous cell carcinoma of skin 12/19/2021   SCC IS, L lat heel, EDC 02/04/2022   Squamous cell carcinoma of skin 01/21/2022   L forearm, EDC 02/04/2022   Squamous cell carcinoma of skin 01/21/2022   R lower leg below knee, EDC 02/04/2022   Squamous cell carcinoma of skin 01/21/2022   SCCIS, R post heel, EDC 02/04/2022   Squamous cell carcinoma of skin 07/01/2022   left lower abdomen, in situ, EDC   Squamous cell carcinoma of skin 07/01/2022   left medial chest, EDC   Urge incontinence    Venous stasis of both lower extremities     Past Surgical History:  Procedure Laterality Date   ABDOMINAL AORTOGRAM W/LOWER EXTREMITY N/A 02/12/2021   Procedure: ABDOMINAL AORTOGRAM W/LOWER EXTREMITY;  Surgeon: Maeola Harman, MD;  Location: [facility] INVASIVE CV LAB;  Service: Cardiovascular;  Laterality: N/A;   ABDOMINAL HYSTERECTOMY  1975   for benign causes  APPENDECTOMY     BI-VENTRICULAR PACEMAKER UPGRADE  05/04/2010   BIOPSY  02/28/2019   Procedure: BIOPSY;  Surgeon: Rachael Fee, MD;  Location: [facility] ENDOSCOPY;  Service: Endoscopy;;   BIOPSY  03/04/2022   Procedure: BIOPSY;  Surgeon: Lemar Lofty., MD;  Location: [facility] ENDOSCOPY;  Service: Gastroenterology;;   CARDIAC CATHETERIZATION  2006   CARDIOVERSION N/A 02/20/2018   Procedure: CARDIOVERSION;  Surgeon: Antonieta Iba, MD;  Location: [facility] ORS;  Service: Cardiovascular;  Laterality: N/A;   CARDIOVERSION N/A 03/27/2018   Procedure: CARDIOVERSION;  Surgeon: Antonieta Iba, MD;  Location: [facility] ORS;  Service: Cardiovascular;  Laterality: N/A;   CATARACT EXTRACTION W/ INTRAOCULAR LENS  IMPLANT, BILATERAL  01/2006-02-2006   CHOLECYSTECTOMY N/A 11/11/2012   Procedure: LAPAROSCOPIC CHOLECYSTECTOMY WITH INTRAOPERATIVE CHOLANGIOGRAM;   Surgeon: Wilmon Arms. Corliss Skains, MD;  Location: [facility] OR;  Service: General;  Laterality: N/A;   COLONOSCOPY WITH PROPOFOL N/A 02/28/2019   Procedure: COLONOSCOPY WITH PROPOFOL;  Surgeon: Rachael Fee, MD;  Location[facility] ENDOSCOPY;  Service: Endoscopy;  Laterality: N/A;   ENTEROSCOPY N/A 03/30/2020   Procedure: ENTEROSCOPY;  Surgeon: Shellia Cleverly, DO;  Location: [facility] ENDOSCOPY;  Service: Gastroenterology;  Laterality: N/A;   ENTEROSCOPY N/A 02/10/2021   Procedure: ENTEROSCOPY;  Surgeon: Jeani Hawking, MD;  Location: [facility] ENDOSCOPY;  Service: Endoscopy;  Laterality: N/A;   ENTEROSCOPY N/A 02/15/2022   Procedure: ENTEROSCOPY;  Surgeon: Imogene Burn, MD;  Location: [facility] ENDOSCOPY;  Service: Gastroenterology;  Laterality: N/A;   ENTEROSCOPY N/A 11/06/2022   Procedure: ENTEROSCOPY;  Surgeon: Meridee Score Netty Starring., MD;  Location: [facility] ENDOSCOPY;  Service: Gastroenterology;  Laterality: N/A;   EP IMPLANTABLE DEVICE N/A 02/21/2016   Procedure: ICD Generator Changeout;  Surgeon: Duke Salvia, MD;  Location: [facility] INVASIVE CV LAB;  Service: Cardiovascular;  Laterality: N/A;   ESOPHAGOGASTRODUODENOSCOPY N/A 03/04/2022   Procedure: ESOPHAGOGASTRODUODENOSCOPY (EGD);  Surgeon: Lemar Lofty., MD;  Location: [facility] ENDOSCOPY;  Service: Gastroenterology;  Laterality: N/A;   ESOPHAGOGASTRODUODENOSCOPY (EGD) WITH PROPOFOL N/A 02/28/2019   Procedure: ESOPHAGOGASTRODUODENOSCOPY (EGD) WITH PROPOFOL;  Surgeon: Rachael Fee, MD;  Location: [facility] ENDOSCOPY;  Service: Endoscopy;  Laterality: N/A;   EUS N/A 03/04/2022   Procedure: UPPER ENDOSCOPIC ULTRASOUND (EUS) RADIAL;  Surgeon: Lemar Lofty., MD;  Location: [facility] ENDOSCOPY;  Service: Gastroenterology;  Laterality: N/A;   FINE NEEDLE ASPIRATION N/A 03/04/2022   Procedure: FINE NEEDLE ASPIRATION (FNA) LINEAR;  Surgeon: Lemar Lofty., MD;  Location: [facility] ENDOSCOPY;  Service: Gastroenterology;  Laterality: N/A;   GIVENS CAPSULE STUDY N/A 03/15/2019    Procedure: GIVENS CAPSULE STUDY;  Surgeon: Tressia Danas, MD;  Location: [facility] ENDOSCOPY;  Service: Gastroenterology;  Laterality: N/A;   HOT HEMOSTASIS N/A 03/30/2020   Procedure: HOT HEMOSTASIS (ARGON PLASMA COAGULATION/BICAP);  Surgeon: Shellia Cleverly, DO;  Location: [facility] ENDOSCOPY;  Service: Gastroenterology;  Laterality: N/A;   HOT HEMOSTASIS N/A 02/10/2021   Procedure: HOT HEMOSTASIS (ARGON PLASMA COAGULATION/BICAP);  Surgeon: Jeani Hawking, MD;  Location: [facility] ENDOSCOPY;  Service: Endoscopy;  Laterality: N/A;   HOT HEMOSTASIS N/A 02/15/2022   Procedure: HOT HEMOSTASIS (ARGON PLASMA COAGULATION/BICAP);  Surgeon: Imogene Burn, MD;  Location: [facility] ENDOSCOPY;  Service: Gastroenterology;  Laterality: N/A;   HOT HEMOSTASIS N/A 11/06/2022   Procedure: HOT HEMOSTASIS (ARGON PLASMA COAGULATION/BICAP);  Surgeon: Lemar Lofty., MD;  Location: [facility] ENDOSCOPY;  Service: Gastroenterology;  Laterality: N/A;   INSERT / REPLACE / REMOVE PACEMAKER  05-01-11   02-28-05-/05-04-10-ICD-MEDTRONIC MAXIMAL DR   IR ANGIOGRAM SELECTIVE EACH ADDITIONAL VESSEL  02/18/2022   IR ANGIOGRAM SELECTIVE EACH ADDITIONAL VESSEL  02/18/2022   IR ANGIOGRAM VISCERAL SELECTIVE  02/16/2022   IR ANGIOGRAM VISCERAL SELECTIVE  02/16/2022   IR EMBO ART  VEN HEMORR LYMPH EXTRAV  INC GUIDE ROADMAPPING  02/16/2022   IR THORACENTESIS ASP PLEURAL SPACE W/IMG GUIDE  02/21/2022   IR THORACENTESIS ASP PLEURAL SPACE W/IMG GUIDE  02/22/2022   IR US GUIDE VASC ACCESS RIGHT  02/16/2022   JOINT REPLACEMENT     LAPAROSCOPIC CHOLECYSTECTOMY  11/11/2012   LAPAROSCOPIC LYSIS OF ADHESIONS N/A 11/11/2012   Procedure: LAPAROSCOPIC LYSIS OF ADHESIONS;  Surgeon: Wilmon Arms. Corliss Skains, MD;  Location: [facility] OR;  Service: General;  Laterality: N/A;   NEPHRECTOMY Right 06/2001    S/P RENAL CELL CANCER   PERIPHERAL VASCULAR INTERVENTION Bilateral 02/12/2021   Procedure: PERIPHERAL VASCULAR INTERVENTION;  Surgeon: Maeola Harman, MD;  Location: [facility]  INVASIVE CV LAB;  Service: Cardiovascular;  Laterality: Bilateral;  Iliac artery stents   PRESSURE SENSOR/CARDIOMEMS N/A 02/03/2019   Procedure: PRESSURE SENSOR/CARDIOMEMS;  Surgeon: Laurey Morale, MD;  Location: [facility] INVASIVE CV LAB;  Service: Cardiovascular;  Laterality: N/A;   RIGHT HEART CATH N/A 11/09/2018   Procedure: RIGHT HEART CATH;  Surgeon: Laurey Morale, MD;  Location: [facility] INVASIVE CV LAB;  Service: Cardiovascular;  Laterality: N/A;   RIGHT HEART CATH N/A 03/08/2019   Procedure: RIGHT HEART CATH;  Surgeon: Laurey Morale, MD;  Location: [facility] INVASIVE CV LAB;  Service: Cardiovascular;  Laterality: N/A;   SUBMUCOSAL TATTOO INJECTION  02/28/2019   Procedure: SUBMUCOSAL TATTOO INJECTION;  Surgeon: Rachael Fee, MD;  Location: [name] ENDOSCOPY;  Service: Endoscopy;;   SUBMUCOSAL TATTOO INJECTION  11/06/2022   Procedure: SUBMUCOSAL TATTOO INJECTION;  Surgeon: Lemar Lofty., MD;  Location: [facility] ENDOSCOPY;  Service: Gastroenterology;;   TOTAL HIP ARTHROPLASTY Right 05/03/2011   Procedure: TOTAL HIP ARTHROPLASTY ANTERIOR APPROACH;  Surgeon: Kathryne Hitch;  Location: [facility] ORS;  Service: Orthopedics;  Laterality: Right;  Removal of Cannulated Screws Right Hip, Right Direct Anterior Hip Replacement   TOTAL HIP ARTHROPLASTY Left 10/09/2018   Procedure: TOTAL HIP ARTHROPLASTY ANTERIOR APPROACH;  Surgeon: Samson Frederic, MD;  Location: [facility] OR;  Service: Orthopedics;  Laterality: Left;     reports that she quit smoking about 21 years ago. Her smoking use included cigarettes. She started smoking about 61 years ago. She has a 20 pack-year smoking history. She has never used smokeless tobacco. She reports that she does not drink alcohol and does not use drugs.  No Known Allergies  Family History  Problem Relation Age of Onset   Heart failure Mother    Early death Father        car accident   Breast cancer Maternal Aunt 71   Breast cancer Cousin    Breast cancer Other    Colon cancer  Neg Hx    Esophageal cancer Neg Hx    Liver disease Neg Hx    Pancreatic cancer Neg Hx    Rectal cancer Neg Hx     Prior to Admission medications   Medication Sig Start Date End Date Taking? Authorizing Provider  acetaminophen (TYLENOL) 500 MG tablet Take 1,000 mg by mouth every 8 (eight) hours as needed for mild pain or headache.    [provider]  apixaban (ELIQUIS) 2.5 MG TABS tablet Take 1 tablet (2.5 mg total) by mouth 2 (two) times daily. 11/10/22   Rai, Delene Ruffini, MD  Biotin 1000 MCG CHEW Chew 2 1e11  Vector Genomes by mouth daily.    [provider]  Cyanocobalamin (B-12 PO) Take 1 tablet by mouth daily.    [provider]  ferrous gluconate (FERGON) 324 MG tablet Take 1 tablet (324 mg total) by mouth daily with breakfast. 11/15/22   Rai, Ripudeep K, MD  fluticasone (FLONASE) 50 MCG/ACT nasal spray Place 2 sprays into both nostrils daily as needed for allergies or rhinitis. 07/04/22   [name] Boyden, MD  pantoprazole (PROTONIX) 40 MG tablet Take 1 tablet (40 mg total) by mouth daily. 11/09/22   Rai, Ripudeep K, MD  potassium chloride SA (KLOR-CON M) 20 MEQ tablet Take 1 tablet (20 mEq total) by mouth 2 (two) times daily. 11/09/22   Rai, Ripudeep K, MD  rosuvastatin (CRESTOR) 40 MG tablet Take 1 tablet (40 mg total) by mouth daily. Patient taking differently: Take 40 mg by mouth every evening. 05/02/22   [name] Boyden, MD  torsemide (DEMADEX) 20 MG tablet Take 4 tablets (80 mg total) by mouth 2 (two) times daily. 08/21/22   [place], Anderson Malta, FNP  vitamin C (ASCORBIC ACID) 250 MG tablet Take 500 mg by mouth daily.    [provider]  VITAMIN D PO Take 4,000 Units by mouth daily.    [provider]     Physical Exam: Vitals:   11/25/22 0244 11/25/22 0308 11/25/22 0500  BP: 131/87  (!) 146/44  Pulse: 60  (!) 59  Resp: 16  15  Temp: 98.4 F (36.9 C)    TempSrc: Oral    SpO2: 100%  100%  Weight:  62 kg   Height:  5\' 6"  (1.676 m)      Physical Exam HENT:     Mouth/Throat:     Mouth: Mucous membranes are moist.  Eyes:     Pupils: Pupils are equal, round, and reactive to light.  Cardiovascular:     Rate and Rhythm: Normal rate and regular rhythm.     Pulses: Normal pulses.     Heart sounds: Normal heart sounds.  Pulmonary:     Effort: Pulmonary effort is normal.     Breath sounds: Normal breath sounds.  Abdominal:     General: Bowel sounds are normal. There is no distension.     Tenderness: There is no abdominal tenderness. There is no guarding or rebound.  Musculoskeletal:     Cervical back: Neck supple.     Right lower leg: No edema.     Left lower leg: No edema.  Skin:    General: Skin is warm.     Capillary Refill: Capillary refill takes less than 2 seconds.  Neurological:     Mental Status: She is alert and oriented to person, place, and time.  Psychiatric:        Mood and Affect: Mood normal.        Thought Content: Thought content normal.      Labs on Admission: I have personally reviewed following labs and imaging studies  CBC: Recent Labs  Lab 11/20/22 1248 11/25/22 0320  WBC 3.7* 3.6*  NEUTROABS 2.9  --   HGB 7.4* 6.9*  HCT 25.2* 24.2*  MCV 93.3 100.0  PLT 186 193   Basic Metabolic Panel: Recent Labs  Lab 11/20/22 1248 11/25/22 0320  NA 141 139  K 4.6 3.9  CL 102 99  CO2 32 31  GLUCOSE 85 116*  BUN 29* 29*  CREATININE 1.54* 1.51*  CALCIUM 9.3 9.0   GFR: Estimated Creatinine Clearance: 25  mL/min (A) (by C-G formula based on SCr of 1.51 mg/dL (H)). Liver Function Tests: Recent Labs  Lab 11/20/22 1248 11/25/22 0320  AST 16 19  ALT 7 10  ALKPHOS 46 40  BILITOT 0.7 0.7  PROT 6.5 6.6  ALBUMIN 4.0 3.7   No results for input(s): "LIPASE", "AMYLASE" in the last 168 hours. No results for input(s): "AMMONIA" in the last 168 hours. Coagulation Profile: No results for input(s): "INR", "PROTIME" in the last 168 hours. Cardiac Enzymes: No results for input(s): "CKTOTAL",  "CKMB", "CKMBINDEX", "TROPONINI", "TROPONINIHS" in the last 168 hours. BNP (last 3 results) Recent Labs    06/10/22 1553 11/08/22 0027  BNP 208.4* 251.2*   HbA1C: No results for input(s): "HGBA1C" in the last 72 hours. CBG: No results for input(s): "GLUCAP" in the last 168 hours. Lipid Profile: No results for input(s): "CHOL", "HDL", "LDLCALC", "TRIG", "CHOLHDL", "LDLDIRECT" in the last 72 hours. Thyroid Function Tests: No results for input(s): "TSH", "T4TOTAL", "FREET4", "T3FREE", "THYROIDAB" in the last 72 hours. Anemia Panel: No results for input(s): "VITAMINB12", "FOLATE", "FERRITIN", "TIBC", "IRON", "RETICCTPCT" in the last 72 hours. Urine analysis:    Component Value Date/Time   COLORURINE YELLOW 08/21/2022 1300   APPEARANCEUR CLEAR 08/21/2022 1300   APPEARANCEUR Cloudy (A) 01/27/2018 1330   LABSPEC 1.016 08/21/2022 1300   PHURINE 5.0 08/21/2022 1300   GLUCOSEU NEGATIVE 08/21/2022 1300   HGBUR NEGATIVE 08/21/2022 1300   HGBUR large 11/16/2009 1202   BILIRUBINUR NEGATIVE 08/21/2022 1300   BILIRUBINUR Negative 11/08/2021 1237   BILIRUBINUR Negative 01/27/2018 1330   KETONESUR NEGATIVE 08/21/2022 1300   PROTEINUR NEGATIVE 08/21/2022 1300   UROBILINOGEN 0.2 11/08/2021 1237   UROBILINOGEN 1.0 08/16/2014 1754   NITRITE NEGATIVE 08/21/2022 1300   LEUKOCYTESUR MODERATE (A) 08/21/2022 1300    Radiological Exams on Admission: I have personally reviewed images No results found.    Assessment/Plan: Principal Problem:   Lower GI bleed Active Problems:   Primary pancreatic neuroendocrine tumor   Acute on chronic anemia   Hypothyroidism   Hyperlipidemia   Hypertrophic cardiomyopathy (HCC)   CKD stage 3b, GFR 30-44 ml/min (HCC)   PAD (peripheral artery disease) (HCC)   Chronic atrial fibrillation with RVR (HCC)   CHF (congestive heart failure) (HCC)   History of gastritis   Angiodysplasia of small intestine   Chronic hypoxic respiratory failure (HCC)   Blood  transfusion reaction   Renal cell carcinoma s/p nephrectomy 2003(HCC)    Assessment and Plan: Lower GI bleed Symptomatic anemia Acute on chronic blood loss anemia Nonbleeding angiodysplasia s/p fulguration ablation 7/3 -Patient coming with complaining up black tarry stool 1 episode since this morning, occasional abdominal pain, general fatigue and dizziness. -CBC showed hemoglobin 6.9 (baseline around 8-9) - In the ED patient receiving 1 unit of blood transfusion (history of transfusion reaction in the past).  Pretreating with Tylenol, Benadryl and Solu-Medrol before blood transfusion. - Per chart review patient has recent hospital admission 2 weeks ago with similar complaint and GI bleed. CTA abdomen pelvis negative for active GI bleed, cirrhosis with indeterminate liver lesions, recommended MRI. -Underwent small bowel enteroscopy on 7/3, no gross lesions in the majority of esophagus, LA grade a esophagitis with no bleeding at the distal esophagus and GE junction, 2 angiodysplastic lesions with no bleeding in third and fourth portion of duodenum.  3 nonbleeding angioplastic lesions in proximal jejunum, fulguration to ablate the lesion to prevent bleeding by argon plasma was successful. -[place] [facility] Dr. Russella Dar has been consulted by EDP  physician.  Waiting for recommendation -Continue Protonix 40 mg IV twice daily - Continue to check hemoglobin 3 times daily.  And transfuse as needed to keep hemoglobin above 8. -Checking anemia panel. -Keeping patient NPO. -Continue oral iron and B12 supplement - Continue gentle hydration with D5 LR 50 cc/h for 1 day -Admitting patient to progressive unit with cardiac monitoring.   Chronic iron deficiency anemia Chronic iron deficiency secondary from chronic blood loss and anemia of chronic disease from underlying cancer -Continue to monitor H&H and checking anemia panel. - Last dose of IV Feraheme on 11/22/2022  Diastolic heart failure preserved EF 60 to  65% Essential hypertension - Patient denies any chest pain or shortness of breath this time.  Most recent hospital admission 3 weeks ago for acute CHF exacerbation and managed with IV Lasix and discharged home with torsemide. - Patient is involved on physical exam and O2 sat 100% room air.  Holding torsemide for now. -Continue hydralazine 10 mg every 8 hours as needed for systolic blood pressure more than 160.   History of angiodysplasia of the stomach and duodenum History of gastritis -Currently on Protonix 40 mg IV twice daily   History of pancreatic cancer -Managing by oncologist at Dr. Mosetta Putt outpatient.  Last clinic visit 11/1718 24.   Permanent atrial fibrillation on Eliquis Currently rate controlled -At home patient is on Eliquis 5 mg twice daily.  Last dose on 7/20 at 10 PM.  Holding Eliquis in the setting of GI bleed -Continue cardiac monitoring -Obtain EKG     CKD stage 3B -Stable renal function.  Creatinine 1.5.  Baseline creatinine around 1.4-1.6 -Continue monitor renal function - Avoid nephrotoxic agent  Mild leukopenia -WBC count 3.6.   -Continue to monitor   Chronic respiratory failure with hypoxia on 2 L O2 -Continue O2, at baseline   PVD Hyperlipidemia - Continue home Crestor  DVT prophylaxis:  SCDs Code Status:  DNR with Intubation .reviewed ACP document and verified with patient Diet: Currently n.p.o.  Family Communication: Discussed treatment plan with patient. Disposition Plan: Monitor for improvement of hemoglobin and resolution of GI bleed.  Waiting for GI evaluation.  Possible discharge to home in 2 to 3 days.  Pending medical course. Consults: Gastroenterology Admission status:   Inpatient, Step Down Unit  Severity of Illness: The appropriate patient status for this patient is INPATIENT. Inpatient status is judged to be reasonable and necessary in order to provide the required intensity of service to ensure the patient's safety. The patient's  presenting symptoms, physical exam findings, and initial radiographic and laboratory data in the context of their chronic comorbidities is felt to place them at high risk for further clinical deterioration. Furthermore, it is not anticipated that the patient will be medically stable for discharge from the hospital within 2 midnights of admission.   * I certify that at the point of admission it is my clinical judgment that the patient will require inpatient hospital care spanning beyond 2 midnights from the point of admission due to high intensity of service, high risk for further deterioration and high frequency of surveillance required.Marland Kitchen    Tereasa Coop, MD [facility]  How to contact the [facility] Attending or Consulting provider 7A - 7P or covering provider during after hours 7P -7A, for this patient.  Check the care team in [facility] and look for a) attending/consulting TRH provider listed and b) the [facility] team listed Log into [email or web address] and use [place]'s universal password to access. If you do  not have the password, please contact the hospital operator. Locate the [facility] provider you are looking for under [facility] and page to a number that you can be directly reached. If you still have difficulty reaching the provider, please page the [facility] (Director on Call) for the Hospitalists listed on amion for assistance.  11/25/2022, 6:18 AM

## 2022-11-25 NOTE — Telephone Encounter (Signed)
Oxygen orders faxed to [facility] on 11/25/22 at [phone]

## 2022-11-25 NOTE — ED Notes (Signed)
ED TO INPATIENT HANDOFF REPORT  ED Nurse Name and Phone #: Mickey Esguerra RN   S Name/Age/Gender [name] 89 y.o. female Room/Bed: 034C/034C  Code Status   Code Status: DNR  Home/SNF/Other Home Patient oriented to: self, place, time, and situation Is this baseline? Yes   Triage Complete: Triage complete  Chief Complaint Lower GI bleed [K92.2] GI bleed [K92.2]  Triage Note Pt arrived from home via POV c/o feeling very fatigued. Pt states that she has had multiple black stools and believes that she has a GI bleed again. Pt hgb when seen at [facility] a few days ago was 7.1.   Allergies No Active Allergies  Level of Care/Admitting Diagnosis ED Disposition     ED Disposition  Admit   Condition  --   Comment  Hospital Area: [facility] [[id number]]  Level of Care: Progressive [102]  Admit to Progressive based on following criteria: GI, ENDOCRINE disease patients with GI bleeding, acute liver failure or pancreatitis, stable with diabetic ketoacidosis or thyrotoxicosis (hypothyroid) state.  May admit patient to [facility] or [facility] if equivalent level of care is available:: No  Covid Evaluation: Asymptomatic - no recent exposure (last 10 days) testing not required  Diagnosis: GI bleed [818299]  Admitting Physician: Lurline Del [[id number]]  Attending Physician: Lurline Del [[id number]]  Certification:: I certify this patient will need inpatient services for at least 2 midnights          B Medical/Surgery History Past Medical History:  Diagnosis Date   (HFpEF) heart failure with preserved ejection fraction (HCC)    EF=60-60%   Actinic keratosis 01/17/2015   R forearm   Adjustment disorder with anxiety    Adverse effect of other narcotics, sequela    Intolerance to all narcotics   Anti-Duffy antibodies present    Aortic valve stenosis    Arthritis    "some in my hands" (11/11/2012)   Atrial fibrillation, permanent (HCC)    Eliquis    Atypical mole 03/25/2018   L forearm - severe   Automatic implantable cardioverter-defibrillator in situ    Avascular necrosis of hip (HCC) 05/03/2011   Carotid artery stenosis 09/2007   a. 09/2007: 60-79% bilateral (stable); b. 10/2008: 40-59% R 60-79%    Cellulitis of left lower extremity 07/13/2020   CKD (chronic kidney disease), stage III (HCC)    COPD (chronic obstructive pulmonary disease) (HCC)    Coronary artery disease    non-obstructive by 2006 cath   COVID-19 virus infection 02/12/2022   Displaced fracture of left femoral neck (HCC) 10/09/2018   GI bleed 03/28/2020   AVM   High cholesterol    Hypertension 05/20/2011   Hypertr obst cardiomyop    Hypotension, unspecified    cardiac cath 2006..nonobstructive CAD 30-40s lesions.Marland KitchenETT 1/09 nondiagnostic due to poor HR response..Right Renal Cancer 2003   Iron deficiency anemia    Long term (current) use of anticoagulants    On home oxygen therapy    3L [place]   Osteoarthritis of right hip    PAD (peripheral artery disease) (HCC)    Pancreatic cancer (HCC)    sandostatin   PONV (postoperative nausea and vomiting)    Presence of permanent cardiac pacemaker    Pulmonary HTN (HCC)    RECTAL BLEEDING 10/13/2009   Qualifier: Diagnosis of  By: Wilmon Pali NP, Paula     Red blood cell antibody positive with compatible PRBC difficult to obtain    Anti FYA (Duffy a) antibody.  Must be transfused with PRB which are Duffy Antigen Negative and Crossmatch Compatible   Renal cell carcinoma (HCC)    s/p nephrectomy   Squamous cell carcinoma of skin 01/17/2015   R lat wrist   Squamous cell carcinoma of skin 01/29/2018   R post upper leg - superficially invasive   Squamous cell carcinoma of skin 03/25/2018   L lat foot   Squamous cell carcinoma of skin 11/09/2020   left lat foot - EDC 01/01/21, recurrent 01/31/21 - MOHs 02/22/21   Squamous cell carcinoma of skin 12/19/2021   Right Posterior Medial Thigh, EDC   Squamous cell carcinoma of skin  12/19/2021   SCC IS, L lat heel, EDC 02/04/2022   Squamous cell carcinoma of skin 01/21/2022   L forearm, EDC 02/04/2022   Squamous cell carcinoma of skin 01/21/2022   R lower leg below knee, EDC 02/04/2022   Squamous cell carcinoma of skin 01/21/2022   SCCIS, R post heel, EDC 02/04/2022   Squamous cell carcinoma of skin 07/01/2022   left lower abdomen, in situ, EDC   Squamous cell carcinoma of skin 07/01/2022   left medial chest, EDC   Urge incontinence    Venous stasis of both lower extremities    Past Surgical History:  Procedure Laterality Date   ABDOMINAL AORTOGRAM W/LOWER EXTREMITY N/A 02/12/2021   Procedure: ABDOMINAL AORTOGRAM W/LOWER EXTREMITY;  Surgeon: Maeola Harman, MD;  Location: [facility] INVASIVE CV LAB;  Service: Cardiovascular;  Laterality: N/A;   ABDOMINAL HYSTERECTOMY  1975   for benign causes   APPENDECTOMY     BI-VENTRICULAR PACEMAKER UPGRADE  05/04/2010   BIOPSY  02/28/2019   Procedure: BIOPSY;  Surgeon: Rachael Fee, MD;  Location: [name] ENDOSCOPY;  Service: Endoscopy;;   BIOPSY  03/04/2022   Procedure: BIOPSY;  Surgeon: Lemar Lofty., MD;  Location: [facility] ENDOSCOPY;  Service: Gastroenterology;;   CARDIAC CATHETERIZATION  2006   CARDIOVERSION N/A 02/20/2018   Procedure: CARDIOVERSION;  Surgeon: Antonieta Iba, MD;  Location: [facility] ORS;  Service: Cardiovascular;  Laterality: N/A;   CARDIOVERSION N/A 03/27/2018   Procedure: CARDIOVERSION;  Surgeon: Antonieta Iba, MD;  Location: [facility] ORS;  Service: Cardiovascular;  Laterality: N/A;   CATARACT EXTRACTION W/ INTRAOCULAR LENS  IMPLANT, BILATERAL  01/2006-02-2006   CHOLECYSTECTOMY N/A 11/11/2012   Procedure: LAPAROSCOPIC CHOLECYSTECTOMY WITH INTRAOPERATIVE CHOLANGIOGRAM;  Surgeon: Wilmon Arms. Corliss Skains, MD;  Location: [facility] OR;  Service: General;  Laterality: N/A;   COLONOSCOPY WITH PROPOFOL N/A 02/28/2019   Procedure: COLONOSCOPY WITH PROPOFOL;  Surgeon: Rachael Fee, MD;  Location: [facility] ENDOSCOPY;   Service: Endoscopy;  Laterality: N/A;   ENTEROSCOPY N/A 03/30/2020   Procedure: ENTEROSCOPY;  Surgeon: Shellia Cleverly, DO;  Location: [facility] ENDOSCOPY;  Service: Gastroenterology;  Laterality: N/A;   ENTEROSCOPY N/A 02/10/2021   Procedure: ENTEROSCOPY;  Surgeon: Jeani Hawking, MD;  Location: [facility] ENDOSCOPY;  Service: Endoscopy;  Laterality: N/A;   ENTEROSCOPY N/A 02/15/2022   Procedure: ENTEROSCOPY;  Surgeon: Imogene Burn, MD;  Location: [facility] ENDOSCOPY;  Service: Gastroenterology;  Laterality: N/A;   ENTEROSCOPY N/A 11/06/2022   Procedure: ENTEROSCOPY;  Surgeon: Meridee Score Netty Starring., MD;  Location: [facility] ENDOSCOPY;  Service: Gastroenterology;  Laterality: N/A;   EP IMPLANTABLE DEVICE N/A 02/21/2016   Procedure: ICD Generator Changeout;  Surgeon: Duke Salvia, MD;  Location: [facility] INVASIVE CV LAB;  Service: Cardiovascular;  Laterality: N/A;   ESOPHAGOGASTRODUODENOSCOPY N/A 03/04/2022   Procedure: ESOPHAGOGASTRODUODENOSCOPY (EGD);  Surgeon: Meridee Score Netty Starring., MD;  Location: [facility] ENDOSCOPY;  Service:  Gastroenterology;  Laterality: N/A;   ESOPHAGOGASTRODUODENOSCOPY (EGD) WITH PROPOFOL N/A 02/28/2019   Procedure: ESOPHAGOGASTRODUODENOSCOPY (EGD) WITH PROPOFOL;  Surgeon: Rachael Fee, MD;  Location: [facility] ENDOSCOPY;  Service: Endoscopy;  Laterality: N/A;   EUS N/A 03/04/2022   Procedure: UPPER ENDOSCOPIC ULTRASOUND (EUS) RADIAL;  Surgeon: Lemar Lofty., MD;  Location: [facility] ENDOSCOPY;  Service: Gastroenterology;  Laterality: N/A;   FINE NEEDLE ASPIRATION N/A 03/04/2022   Procedure: FINE NEEDLE ASPIRATION (FNA) LINEAR;  Surgeon: Lemar Lofty., MD;  Location: [facility] ENDOSCOPY;  Service: Gastroenterology;  Laterality: N/A;   GIVENS CAPSULE STUDY N/A 03/15/2019   Procedure: GIVENS CAPSULE STUDY;  Surgeon: Tressia Danas, MD;  Location: [facility] ENDOSCOPY;  Service: Gastroenterology;  Laterality: N/A;   HOT HEMOSTASIS N/A 03/30/2020   Procedure: HOT HEMOSTASIS (ARGON PLASMA COAGULATION/BICAP);   Surgeon: Shellia Cleverly, DO;  Location: [facility] ENDOSCOPY;  Service: Gastroenterology;  Laterality: N/A;   HOT HEMOSTASIS N/A 02/10/2021   Procedure: HOT HEMOSTASIS (ARGON PLASMA COAGULATION/BICAP);  Surgeon: Jeani Hawking, MD;  Location: [facility] ENDOSCOPY;  Service: Endoscopy;  Laterality: N/A;   HOT HEMOSTASIS N/A 02/15/2022   Procedure: HOT HEMOSTASIS (ARGON PLASMA COAGULATION/BICAP);  Surgeon: Imogene Burn, MD;  Location: [facility] ENDOSCOPY;  Service: Gastroenterology;  Laterality: N/A;   HOT HEMOSTASIS N/A 11/06/2022   Procedure: HOT HEMOSTASIS (ARGON PLASMA COAGULATION/BICAP);  Surgeon: Lemar Lofty., MD;  Location: [facility] ENDOSCOPY;  Service: Gastroenterology;  Laterality: N/A;   INSERT / REPLACE / REMOVE PACEMAKER  05-01-11   02-28-05-/05-04-10-ICD-MEDTRONIC MAXIMAL DR   IR ANGIOGRAM SELECTIVE EACH ADDITIONAL VESSEL  02/18/2022   IR ANGIOGRAM SELECTIVE EACH ADDITIONAL VESSEL  02/18/2022   IR ANGIOGRAM VISCERAL SELECTIVE  02/16/2022   IR ANGIOGRAM VISCERAL SELECTIVE  02/16/2022   IR EMBO ART  VEN HEMORR LYMPH EXTRAV  INC GUIDE ROADMAPPING  02/16/2022   IR THORACENTESIS ASP PLEURAL SPACE W/IMG GUIDE  02/21/2022   IR THORACENTESIS ASP PLEURAL SPACE W/IMG GUIDE  02/22/2022   IR US GUIDE VASC ACCESS RIGHT  02/16/2022   JOINT REPLACEMENT     LAPAROSCOPIC CHOLECYSTECTOMY  11/11/2012   LAPAROSCOPIC LYSIS OF ADHESIONS N/A 11/11/2012   Procedure: LAPAROSCOPIC LYSIS OF ADHESIONS;  Surgeon: Wilmon Arms. Corliss Skains, MD;  Location: [facility] OR;  Service: General;  Laterality: N/A;   NEPHRECTOMY Right 06/2001    S/P RENAL CELL CANCER   PERIPHERAL VASCULAR INTERVENTION Bilateral 02/12/2021   Procedure: PERIPHERAL VASCULAR INTERVENTION;  Surgeon: Maeola Harman, MD;  Location: [facility] INVASIVE CV LAB;  Service: Cardiovascular;  Laterality: Bilateral;  Iliac artery stents   PRESSURE SENSOR/CARDIOMEMS N/A 02/03/2019   Procedure: PRESSURE SENSOR/CARDIOMEMS;  Surgeon: Laurey Morale, MD;  Location: [facility] INVASIVE CV LAB;   Service: Cardiovascular;  Laterality: N/A;   RIGHT HEART CATH N/A 11/09/2018   Procedure: RIGHT HEART CATH;  Surgeon: Laurey Morale, MD;  Location: [facility] INVASIVE CV LAB;  Service: Cardiovascular;  Laterality: N/A;   RIGHT HEART CATH N/A 03/08/2019   Procedure: RIGHT HEART CATH;  Surgeon: Laurey Morale, MD;  Location: [facility] INVASIVE CV LAB;  Service: Cardiovascular;  Laterality: N/A;   SUBMUCOSAL TATTOO INJECTION  02/28/2019   Procedure: SUBMUCOSAL TATTOO INJECTION;  Surgeon: Rachael Fee, MD;  Location: [facility] ENDOSCOPY;  Service: Endoscopy;;   SUBMUCOSAL TATTOO INJECTION  11/06/2022   Procedure: SUBMUCOSAL TATTOO INJECTION;  Surgeon: Lemar Lofty., MD;  Location: [facility] ENDOSCOPY;  Service: Gastroenterology;;   TOTAL HIP ARTHROPLASTY Right 05/03/2011   Procedure: TOTAL HIP ARTHROPLASTY ANTERIOR APPROACH;  Surgeon: Kathryne Hitch;  Location: [facility] ORS;  Service: Orthopedics;  Laterality: Right;  Removal of Cannulated Screws Right Hip, Right Direct Anterior Hip Replacement   TOTAL HIP ARTHROPLASTY Left 10/09/2018   Procedure: TOTAL HIP ARTHROPLASTY ANTERIOR APPROACH;  Surgeon: Samson Frederic, MD;  Location: [facility] OR;  Service: Orthopedics;  Laterality: Left;     A IV Location/Drains/Wounds Patient Lines/Drains/Airways Status     Active Line/Drains/Airways     Name Placement date Placement time Site Days   Peripheral IV 11/25/22 20 G Right Antecubital 11/25/22  0550  Antecubital   less than 1   Peripheral IV 11/25/22 20 G Anterior;Left Forearm 11/25/22  0601  Forearm  less than 1   Wound / Incision (Open or Dehisced) 02/16/22 Puncture Groin Right 02/16/22  2102  Groin  282   Wound / Incision (Open or Dehisced) 11/04/22 Non-pressure wound Foot Right;Anterior recurrent SCC neoplasm right foot 11/04/22  2200  Foot  21   Wound / Incision (Open or Dehisced) 11/04/22 Non-pressure wound Foot Left;Lower Recurrent SCC neoplasm Left Lateral foot 11/04/22  2200  Foot  21             Intake/Output Last 24 hours  Intake/Output Summary (Last 24 hours) at 11/25/2022 1351 Last data filed at 11/25/2022 1134 Gross per 24 hour  Intake 1231.25 ml  Output --  Net 1231.25 ml    Labs/Imaging Results for orders placed or performed during the hospital encounter of 11/25/22 (from the past 48 hour(s))  Comprehensive metabolic panel     Status: Abnormal   Collection Time: 11/25/22  3:20 AM  Result Value Ref Range   Sodium 139 135 - 145 mmol/L   Potassium 3.9 3.5 - 5.1 mmol/L   Chloride 99 98 - 111 mmol/L   CO2 31 22 - 32 mmol/L   Glucose, Bld 116 (H) 70 - 99 mg/dL    Comment: Glucose reference range applies only to samples taken after fasting for at least 8 hours.   BUN 29 (H) 8 - 23 mg/dL   Creatinine, Ser 1.61 (H) 0.44 - 1.00 mg/dL   Calcium 9.0 8.9 - 09.6 mg/dL   Total Protein 6.6 6.5 - 8.1 g/dL   Albumin 3.7 3.5 - 5.0 g/dL   AST 19 15 - 41 U/L   ALT 10 0 - 44 U/L   Alkaline Phosphatase 40 38 - 126 U/L   Total Bilirubin 0.7 0.3 - 1.2 mg/dL   GFR, Estimated 33 (L) >60 mL/min    Comment: (NOTE) Calculated using the CKD-EPI Creatinine Equation (2021)    Anion gap 9 5 - 15    Comment: Performed at [facility] Lab, [address] [place], [place] [address]  CBC     Status: Abnormal   Collection Time: 11/25/22  3:20 AM  Result Value Ref Range   WBC 3.6 (L) 4.0 - 10.5 K/uL   RBC 2.42 (L) 3.87 - 5.11 MIL/uL   Hemoglobin 6.9 (LL) 12.0 - 15.0 g/dL    Comment: REPEATED TO VERIFY THIS CRITICAL RESULT HAS VERIFIED AND BEEN CALLED TO HECTOR HERNANDEZ RN BY SHERRY GALLOWAY ON 07 22 2024 AT 0356, AND HAS BEEN READ BACK.     HCT 24.2 (L) 36.0 - 46.0 %   MCV 100.0 80.0 - 100.0 fL   MCH 28.5 26.0 - 34.0 pg   MCHC 28.5 (L) 30.0 - 36.0 g/dL   RDW 98.1 (H) 19.1 - 47.8 %   Platelets 193 150 - 400 K/uL   nRBC 0.0 0.0 - 0.2 %  Comment: Performed at [facility], [address], [place], [place] [address]  Type and screen [facility]     Status:  None (Preliminary result)   Collection Time: 11/25/22  3:20 AM  Result Value Ref Range   ABO/RH(D) A NEG    Antibody Screen POS    Sample Expiration 11/28/2022,2359    Antibody Identification ANTI FYA (Duffy a)    Unit Number [id number]    Blood Component Type RED CELLS,LR    Unit division 00    Status of Unit ALLOCATED    Donor AG Type      NEGATIVE FOR DUFFY A ANTIGEN NEGATIVE FOR KIDD B ANTIGEN NEGATIVE FOR KELL ANTIGEN   Transfusion Status OK TO TRANSFUSE    Crossmatch Result COMPATIBLE    Unit Number [id number]    Blood Component Type RED CELLS,LR    Unit division 00    Status of Unit ISSUED    Donor AG Type      NEGATIVE FOR DUFFY A ANTIGEN NEGATIVE FOR KIDD B ANTIGEN NEGATIVE FOR KELL ANTIGEN   Transfusion Status OK TO TRANSFUSE    Crossmatch Result COMPATIBLE   Folate     Status: None   Collection Time: 11/25/22  3:20 AM  Result Value Ref Range   Folate 29.7 >5.9 ng/mL    Comment: Performed at [facility], [address], [place], [place] [address]  Hemoglobin and hematocrit, blood     Status: Abnormal   Collection Time: 11/25/22  3:20 AM  Result Value Ref Range   Hemoglobin 6.8 (LL) 12.0 - 15.0 g/dL    Comment: CRITICAL VALUE NOTED.  VALUE IS CONSISTENT WITH PREVIOUSLY REPORTED AND CALLED VALUE. REPEATED TO VERIFY    HCT 23.6 (L) 36.0 - 46.0 %    Comment: Performed at [facility], [address], [place], [place] [address]  Prepare RBC (crossmatch)     Status: None   Collection Time: 11/25/22  5:01 AM  Result Value Ref Range   Order Confirmation      ORDER PROCESSED BY BLOOD BANK Performed at [facility], [address], [place], [place] [address]   Prepare RBC (crossmatch)     Status: None   Collection Time: 11/25/22  6:30 AM  Result Value Ref Range   Order Confirmation      ORDER PROCESSED BY BLOOD BANK BB SAMPLE OR UNITS ALREADY AVAILABLE Performed at [facility], [address], [place], [place] [address]    *Note: Due  to a large number of results and/or encounters for the requested time period, some results have not been displayed. A complete set of results can be found in Results Review.   No results found.  Pending Labs Unresulted Labs (From admission, onward)     Start     Ordered   11/26/22 0500  Comprehensive metabolic panel  Tomorrow morning,   R        11/25/22 0615   11/25/22 0900  Iron and TIBC  Once,   AD        11/25/22 0900   11/25/22 0900  Ferritin  Once,   AD        11/25/22 0900   11/25/22 0800  Hemoglobin and hematocrit, blood  3 times daily,   R (with TIMED occurrences)      11/25/22 0615   11/25/22 6433  Vitamin B12  (Anemia Panel (PNL))  Add-on,   AD  11/25/22 0607            Vitals/Pain Today's Vitals   11/25/22 1230 11/25/22 1300 11/25/22 1330 11/25/22 1335  BP: (!) 144/69 (!) 135/46 (!) 134/39   Pulse: 61 60 63   Resp: 17 (!) 22 17   Temp:    98.5 F (36.9 C)  TempSrc:    Oral  SpO2: 95% 100% 100%   Weight:      Height:      PainSc:        Isolation Precautions No active isolations  Medications Medications  0.9 %  sodium chloride infusion (Manually program via Guardrails IV Fluids) ( Intravenous Not Given 11/25/22 0723)  ferrous gluconate (FERGON) tablet 324 mg (324 mg Oral Given 11/25/22 1002)  ascorbic acid (VITAMIN C) tablet 500 mg (500 mg Oral Given 11/25/22 1002)  dextrose 5 % in lactated ringers infusion ( Intravenous Not Given 11/25/22 1248)  sodium chloride flush (NS) 0.9 % injection 3 mL (3 mLs Intravenous Given 11/25/22 1006)  sodium chloride flush (NS) 0.9 % injection 3 mL (has no administration in time range)  0.9 %  sodium chloride infusion (has no administration in time range)  acetaminophen (TYLENOL) tablet 650 mg (has no administration in time range)    Or  acetaminophen (TYLENOL) suppository 650 mg (has no administration in time range)  morphine (PF) 2 MG/ML injection 2 mg (has no administration in time range)  ondansetron (ZOFRAN)  tablet 4 mg (has no administration in time range)    Or  ondansetron (ZOFRAN) injection 4 mg (has no administration in time range)  hydrALAZINE (APRESOLINE) injection 10 mg (has no administration in time range)  pantoprazole (PROTONIX) injection 40 mg (40 mg Intravenous Given 11/25/22 1002)  rosuvastatin (CRESTOR) tablet 40 mg (has no administration in time range)  pantoprazole (PROTONIX) 80 mg /NS 100 mL IVPB (0 mg Intravenous Stopped 11/25/22 0656)  acetaminophen (TYLENOL) tablet 1,000 mg (1,000 mg Oral Given 11/25/22 0622)  diphenhydrAMINE (BENADRYL) injection 25 mg (25 mg Intravenous Given 11/25/22 0623)  methylPREDNISolone sodium succinate (SOLU-MEDROL) 125 mg/2 mL injection 125 mg (125 mg Intravenous Given 11/25/22 0622)  0.9 %  sodium chloride infusion (Manually program via Guardrails IV Fluids) (0 mLs Intravenous Stopped 11/25/22 1134)    Mobility walks     Focused Assessments    R Recommendations: See Admitting Provider Note  Report given to:   Additional Notes:

## 2022-11-25 NOTE — Consult Note (Signed)
Consultation  Referring Provider:  [facility]  Primary Care Physician:  [name], MD Primary Gastroenterologist:  Dr. Barron Alvine       Reason for Consultation:     Melena  LOS: 0 days          HPI:   [name] is a 89 y.o. 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  Patient with family at bedside, granddaughter. Provided some of the history.   Recent admission 7/1 to 7/6 due to melena.  At that time her hemoglobin was 6.8.  CTA negative for GI bleed but showed cirrhosis with indeterminate liver lesion. Small bowel enteroscopy 7/3 showed  2 cm hiatal hernia, LA grade a esophagitis, 2 nonbleeding angiodysplastic lesions in the duodenum (treated with APC).  Normal mucosa in entire examined duodenum.  3 nonbleeding angiodysplastic lesions in jejunum (treated with APC).  Normal mucosa in jejunum.  Was discharged on oral Protonix.  Additionally during this admission she was found to have CHF exacerbation.  Treated with IV Lasix during admission and was transition to torsemide upon discharge.  Patient presented to emergency department due to recurrent melena.  She states after being discharged from last hospital admission she was having normal brown bowel movements and her energy level was improved.  She then noticed intermittent melena and increasing fatigue which then progressed to persistent melena and resulted in her presenting to the ED.  She has been on oral iron therapy for many years.  Hgb 6.8 (7.4 5 days ago)... Baseline appears 8-9 MCV 100 Platelet 193 BUN 29, creatinine 1.51, GFR 33 Iron studies pending Blood pressure 131/87, 100% sat O2 on room air Potassium 3.9  She states she  has not had her Sandostatin in a couple of months.  She just restarted and took her first dose last Wednesday.  Denies abdominal pain, nausea, vomiting, weight loss.  Denies NSAID use, alcohol, tobacco use.  Has been seen by cardiologist in April.  At that time they mentioned concern for significant RV dysfunction and goals of care were discussed as she is nearing end-stage RV failure.  Her last dose of Eliquis was 7/21 AM    PREVIOUS GI WORKUP--------------------------------------------------- **Most recent studies. Not all inclusive**   Oct 2020 colonoscopy for iron deficiency anemia Good prep.  -Diverticulosis throughout colon  - Patchy, moderate inflammation in the sigmoid colon. I have most concern for ischemic, possibly chronic. Two sites were biopsied. These findings could possibly explain acute on chronic, heme + anemia. - I have concerns about what appear to be ischemic changes in her left colon causing bleeding, anemia. I recommend up to date cardilogy consultation as she has known CHF, Afib and also consider messenteric vascular workup. -    Nov 2020 Capsule study    Oct 2022 Small bowel endoscopy for melena - Normal esophagus. - A few non-bleeding angiodysplastic lesions in the stomach. Treated with a monopolar probe. - Four bleeding angiodysplastic lesions in the duodenum. Treated with a monopolar probe. - A single non-bleeding angiodysplastic lesion in the jejunum. Treated with a monopolar probe. - No specimens collected.   Oct 2023 small bowel endoscopy for melena - Normal esophagus. - Hiatal hernia. - Gastritis. - Two non-bleeding angioectasias in the duodenum. Treated with APC -  The examined portion of the jejunum was normal. - No specimens collected. -    Oct 2023 EGD and EUS EGD impression: - No gross lesions in the entire esophagus. Z-line irregular, 38 cm from the incisors. - 3 cm hiatal hernia. - Erosive gastropathy with no bleeding and no stigmata of recent bleeding.  No other gross lesions in the entire stomach. Biopsied. - Duodenal erosion without bleeding -likely site of prior APC. No other gross lesions in the duodenal bulb, in the first portion of the duodenum and in the second portion of the duodenum. - Normal major papilla pointed superiorly.    EUS impression: - A mass was identified in the pancreatic head/uncinate process. Cytology results are pending. However, the endosonographic appearance is suggestive of a neuroendocrine tumor. This was staged T3 N0 Mx by endosonographic criteria. The staging applies if malignancy is confirmed. Fine needle biopsy performed. -2 lesions were identified in the pancreatic tail. Cytology results are pending. However, the endosonographic appearance is suspicious for potential synchronous neuroendocrine tumors. Fine needle biopsy performed. - There was no sign of significant pathology in the common bile duct and in the common hepatic duct. - No malignant-appearing lymph nodes were visualized in the celiac region (level 20), peripancreatic region and porta hepatis region.   FINAL MICROSCOPIC DIAGNOSIS:  - Neuroendocrine neoplasm  The cytologic features are most consistent with pancreatic  neuroendocrine neoplasm.  A. GASTRIC BIOPSY:  Fragments of gastric mucosa with minimal vascular ectasia.  H. pylori, intestinal metaplasia, atrophy and dysplasia are not  identified.    Past Medical History:  Diagnosis Date   (HFpEF) heart failure with preserved ejection fraction (HCC)    EF=60-60%   Actinic keratosis 01/17/2015   R forearm   Adjustment disorder with anxiety    Adverse effect of other narcotics, sequela    Intolerance to all narcotics   Anti-Duffy antibodies present    Aortic valve stenosis    Arthritis    "some in my hands" (11/11/2012)   Atrial fibrillation, permanent (HCC)    Eliquis   Atypical mole 03/25/2018   L forearm - severe   Automatic implantable cardioverter-defibrillator in situ    Avascular  necrosis of hip (HCC) 05/03/2011   Carotid artery stenosis 09/2007   a. 09/2007: 60-79% bilateral (stable); b. 10/2008: 40-59% R 60-79%    Cellulitis of left lower extremity 07/13/2020   CKD (chronic kidney disease), stage III (HCC)    COPD (chronic obstructive pulmonary disease) (HCC)    Coronary artery disease    non-obstructive by 2006 cath   COVID-19 virus infection 02/12/2022   Displaced fracture of left femoral neck (HCC) 10/09/2018   GI bleed 03/28/2020   AVM   High cholesterol    Hypertension 05/20/2011   Hypertr obst cardiomyop    Hypotension, unspecified    cardiac cath 2006..nonobstructive CAD 30-40s lesions.Marland KitchenETT 1/09 nondiagnostic due to poor HR response..Right Renal Cancer 2003   Iron deficiency anemia    Long term (current) use of anticoagulants    On home oxygen therapy    3L [place]   Osteoarthritis of right hip    PAD (peripheral artery disease) (HCC)    Pancreatic cancer (HCC)    sandostatin   PONV (postoperative nausea and vomiting)    Presence of permanent cardiac pacemaker    Pulmonary HTN (HCC)    RECTAL BLEEDING 10/13/2009   Qualifier: Diagnosis of  By: Wilmon Pali NP, Paula     Red blood cell antibody positive with compatible PRBC difficult to  obtain    Anti FYA (Duffy a) antibody. Must be transfused with PRB which are Duffy Antigen Negative and Crossmatch Compatible   Renal cell carcinoma (HCC)    s/p nephrectomy   Squamous cell carcinoma of skin 01/17/2015   R lat wrist   Squamous cell carcinoma of skin 01/29/2018   R post upper leg - superficially invasive   Squamous cell carcinoma of skin 03/25/2018   L lat foot   Squamous cell carcinoma of skin 11/09/2020   left lat foot - EDC 01/01/21, recurrent 01/31/21 - MOHs 02/22/21   Squamous cell carcinoma of skin 12/19/2021   Right Posterior Medial Thigh, EDC   Squamous cell carcinoma of skin 12/19/2021   SCC IS, L lat heel, EDC 02/04/2022   Squamous cell carcinoma of skin 01/21/2022   L forearm, EDC 02/04/2022    Squamous cell carcinoma of skin 01/21/2022   R lower leg below knee, EDC 02/04/2022   Squamous cell carcinoma of skin 01/21/2022   SCCIS, R post heel, EDC 02/04/2022   Squamous cell carcinoma of skin 07/01/2022   left lower abdomen, in situ, EDC   Squamous cell carcinoma of skin 07/01/2022   left medial chest, EDC   Urge incontinence    Venous stasis of both lower extremities     Surgical History:  She  has a past surgical history that includes Abdominal hysterectomy (1975); Cardiac catheterization (2006); Nephrectomy (Right, 06/2001); Cataract extraction w/ intraocular lens  implant, bilateral (01/2006-02-2006); Insert / replace / remove pacemaker (05-01-11); Appendectomy; Total hip arthroplasty (Right, 05/03/2011); Laparoscopic cholecystectomy (11/11/2012); Bi-ventricular pacemaker upgrade (05/04/2010); Joint replacement; Cholecystectomy (N/A, 11/11/2012); Laparoscopic lysis of adhesions (N/A, 11/11/2012); Cardiac catheterization (N/A, 02/21/2016); CARDIOVERSION (N/A, 02/20/2018); CARDIOVERSION (N/A, 03/27/2018); Total hip arthroplasty (Left, 10/09/2018); RIGHT HEART CATH (N/A, 11/09/2018); PRESSURE SENSOR/CARDIOMEMS (N/A, 02/03/2019); Esophagogastroduodenoscopy (egd) with propofol (N/A, 02/28/2019); Colonoscopy with propofol (N/A, 02/28/2019); Submucosal tattoo injection (02/28/2019); biopsy (02/28/2019); RIGHT HEART CATH (N/A, 03/08/2019); [place] capsule study (N/A, 03/15/2019); enteroscopy (N/A, 03/30/2020); Hot hemostasis (N/A, 03/30/2020); enteroscopy (N/A, 02/10/2021); Hot hemostasis (N/A, 02/10/2021); ABDOMINAL AORTOGRAM W/LOWER EXTREMITY (N/A, 02/12/2021); PERIPHERAL VASCULAR INTERVENTION (Bilateral, 02/12/2021); IR US Guide Vasc Access Right (02/16/2022); IR Angiogram Visceral Selective (02/16/2022); IR EMBO ART  VEN HEMORR LYMPH EXTRAV  INC GUIDE ROADMAPPING (02/16/2022); IR Angiogram Visceral Selective (02/16/2022); IR Angiogram Selective Each Additional Vessel (02/18/2022); IR Angiogram Selective Each  Additional Vessel (02/18/2022); enteroscopy (N/A, 02/15/2022); Hot hemostasis (N/A, 02/15/2022); IR THORACENTESIS ASP PLEURAL SPACE W/IMG GUIDE (02/21/2022); IR THORACENTESIS ASP PLEURAL SPACE W/IMG GUIDE (02/22/2022); EUS (N/A, 03/04/2022); Esophagogastroduodenoscopy (N/A, 03/04/2022); biopsy (03/04/2022); Fine needle aspiration (N/A, 03/04/2022); enteroscopy (N/A, 11/06/2022); Hot hemostasis (N/A, 11/06/2022); and Submucosal tattoo injection (11/06/2022). Family History:  Her family history includes Breast cancer in her cousin and another family member; Breast cancer (age of onset: 56) in her maternal aunt; Early death in her father; Heart failure in her mother. Social History:   reports that she quit smoking about 21 years ago. Her smoking use included cigarettes. She started smoking about 61 years ago. She has a 20 pack-year smoking history. She has never used smokeless tobacco. She reports that she does not drink alcohol and does not use drugs.  Prior to Admission medications   Medication Sig Start Date End Date Taking? Authorizing Provider  acetaminophen (TYLENOL) 500 MG tablet Take 1,000 mg by mouth every 8 (eight) hours as needed for mild pain or headache.   Yes [provider]  apixaban (ELIQUIS) 2.5 MG TABS tablet Take 1 tablet (2.5 mg total) by mouth  2 (two) times daily. 11/10/22  Yes Rai, Ripudeep K, MD  Cyanocobalamin (B-12 PO) Take 1 tablet by mouth daily.   Yes [provider]  ferrous gluconate (FERGON) 324 MG tablet Take 1 tablet (324 mg total) by mouth daily with breakfast. 11/15/22  Yes Rai, Ripudeep K, MD  fluticasone (FLONASE) 50 MCG/ACT nasal spray Place 2 sprays into both nostrils daily as needed for allergies or rhinitis. 07/04/22  Yes [name], MD  pantoprazole (PROTONIX) 40 MG tablet Take 1 tablet (40 mg total) by mouth daily. 11/09/22  Yes Rai, Ripudeep K, MD  potassium chloride SA (KLOR-CON M) 20 MEQ tablet Take 1 tablet (20 mEq total) by mouth 2 (two) times  daily. Patient taking differently: Take 20 mEq by mouth 3 (three) times daily. 11/09/22  Yes Rai, Ripudeep K, MD  rosuvastatin (CRESTOR) 40 MG tablet Take 1 tablet (40 mg total) by mouth daily. Patient taking differently: Take 40 mg by mouth every evening. 05/02/22  Yes [name], MD  torsemide (DEMADEX) 20 MG tablet Take 4 tablets (80 mg total) by mouth 2 (two) times daily. 08/21/22  Yes [place], Anderson Malta, FNP  vitamin C (ASCORBIC ACID) 250 MG tablet Take 500 mg by mouth daily.   Yes [provider]  VITAMIN D PO Take 4,000 Units by mouth daily.   Yes [provider]    Current Facility-Administered Medications  Medication Dose Route Frequency Provider Last Rate Last Admin   0.9 %  sodium chloride infusion (Manually program via Guardrails IV Fluids)   Intravenous Once Sundil, Subrina, MD       0.9 %  sodium chloride infusion (Manually program via Guardrails IV Fluids)   Intravenous Once Sundil, Subrina, MD       0.9 %  sodium chloride infusion  250 mL Intravenous PRN Janalyn Shy, Subrina, MD       acetaminophen (TYLENOL) tablet 650 mg  650 mg Oral Q6H PRN Janalyn Shy, Subrina, MD       Or   acetaminophen (TYLENOL) suppository 650 mg  650 mg Rectal Q6H PRN Janalyn Shy, Subrina, MD       ascorbic acid (VITAMIN C) tablet 500 mg  500 mg Oral Daily Sundil, Subrina, MD       dextrose 5 % in lactated ringers infusion   Intravenous Continuous Sundil, Subrina, MD       ferrous gluconate (FERGON) tablet 324 mg  324 mg Oral Q breakfast Sundil, Subrina, MD       hydrALAZINE (APRESOLINE) injection 10 mg  10 mg Intravenous Q8H PRN Sundil, Subrina, MD       morphine (PF) 2 MG/ML injection 2 mg  2 mg Intravenous Q2H PRN Janalyn Shy, Subrina, MD       ondansetron [facility]) tablet 4 mg  4 mg Oral Q6H PRN Janalyn Shy, Subrina, MD       Or   ondansetron [facility]) injection 4 mg  4 mg Intravenous Q6H PRN Janalyn Shy, Subrina, MD       pantoprazole (PROTONIX) injection 40 mg  40 mg Intravenous Q12H Sundil, Subrina, MD        rosuvastatin (CRESTOR) tablet 40 mg  40 mg Oral QPM Sundil, Subrina, MD       sodium chloride flush (NS) 0.9 % injection 3 mL  3 mL Intravenous Q12H Sundil, Subrina, MD       sodium chloride flush (NS) 0.9 % injection 3 mL  3 mL Intravenous PRN Tereasa Coop, MD       Current Outpatient Medications  Medication Sig Dispense Refill   acetaminophen (TYLENOL) 500 MG tablet Take 1,000 mg by mouth every 8 (eight) hours as needed for mild pain or headache.     apixaban (ELIQUIS) 2.5 MG TABS tablet Take 1 tablet (2.5 mg total) by mouth 2 (two) times daily. 60 tablet 11   Cyanocobalamin (B-12 PO) Take 1 tablet by mouth daily.     ferrous gluconate (FERGON) 324 MG tablet Take 1 tablet (324 mg total) by mouth daily with breakfast. 30 tablet 3   fluticasone (FLONASE) 50 MCG/ACT nasal spray Place 2 sprays into both nostrils daily as needed for allergies or rhinitis. 16 g 0   pantoprazole (PROTONIX) 40 MG tablet Take 1 tablet (40 mg total) by mouth daily. 30 tablet 3   potassium chloride SA (KLOR-CON M) 20 MEQ tablet Take 1 tablet (20 mEq total) by mouth 2 (two) times daily. (Patient taking differently: Take 20 mEq by mouth 3 (three) times daily.)     rosuvastatin (CRESTOR) 40 MG tablet Take 1 tablet (40 mg total) by mouth daily. (Patient taking differently: Take 40 mg by mouth every evening.) 30 tablet 11   torsemide (DEMADEX) 20 MG tablet Take 4 tablets (80 mg total) by mouth 2 (two) times daily. 240 tablet 6   vitamin C (ASCORBIC ACID) 250 MG tablet Take 500 mg by mouth daily.     VITAMIN D PO Take 4,000 Units by mouth daily.      Allergies as of 11/25/2022   (No Active Allergies)    Review of Systems  Constitutional:  Positive for malaise/fatigue. Negative for chills, fever and weight loss.  HENT:  Negative for hearing loss and tinnitus.   Eyes:  Negative for blurred vision.  Respiratory:  Negative for cough and hemoptysis.   Cardiovascular:  Negative for chest pain and palpitations.   Gastrointestinal:  Positive for melena. Negative for abdominal pain, blood in stool, constipation, diarrhea, heartburn, nausea and vomiting.  Genitourinary:  Negative for dysuria and urgency.  Musculoskeletal:  Negative for myalgias and neck pain.  Skin:  Negative for itching and rash.  Neurological:  Negative for seizures and loss of consciousness.  Psychiatric/Behavioral:  Negative for depression and suicidal ideas.        Physical Exam:  Vital signs in last 24 hours: Temp:  [98 F (36.7 C)-98.4 F (36.9 C)] 98.3 F (36.8 C) (07/22 0701) Pulse Rate:  [59-61] 60 (07/22 0830) Resp:  [15-20] 20 (07/22 0830) BP: (122-161)/(40-87) 161/52 (07/22 0830) SpO2:  [96 %-100 %] 100 % (07/22 0830) Weight:  [62 kg] 62 kg (07/22 0308)   Last BM recorded by nurses in past 5 days No data recorded  Physical Exam Constitutional:      Appearance: Normal appearance.  HENT:     Nose: Nose normal. No congestion.  Eyes:     Extraocular Movements: Extraocular movements intact.     Comments: Conjunctival pallor  Cardiovascular:     Rate and Rhythm: Normal rate and regular rhythm.  Pulmonary:     Effort: Pulmonary effort is normal. No respiratory distress.  Abdominal:     General: Abdomen is flat. Bowel sounds are normal. There is no distension.     Palpations: Abdomen is soft. There is no mass.     Tenderness: There is no abdominal tenderness. There is no guarding or rebound.     Hernia: No hernia is present.  Musculoskeletal:        General: No swelling. Normal range of motion.  Cervical back: Normal range of motion and neck supple.  Skin:    General: Skin is warm and dry.     Coloration: Skin is pale. Skin is not jaundiced.  Neurological:     General: No focal deficit present.     Mental Status: She is alert and oriented to person, place, and time.  Psychiatric:        Mood and Affect: Mood normal.        Behavior: Behavior normal.        Thought Content: Thought content normal.         Judgment: Judgment normal.      LAB RESULTS: Recent Labs    11/25/22 0320  WBC 3.6*  HGB 6.8*  6.9*  HCT 23.6*  24.2*  PLT 193   BMET Recent Labs    11/25/22 0320  NA 139  K 3.9  CL 99  CO2 31  GLUCOSE 116*  BUN 29*  CREATININE 1.51*  CALCIUM 9.0   LFT Recent Labs    11/25/22 0320  PROT 6.6  ALBUMIN 3.7  AST 19  ALT 10  ALKPHOS 40  BILITOT 0.7   PT/INR No results for input(s): "LABPROT", "INR" in the last 72 hours.  STUDIES: No results found.    Impression/Plan   89 year old female with history of small bowel AVMs admitted with melena and symptomatic anemia (acute on chronic) in the setting of Eliquis use  Melena, small bowel AVMs, symptomatic anemia Hgb 6.8 (7.4 5 days ago)... Baseline appears 8-9, receiving 1 unit PRBCs MCV 100 Platelet 193 BUN 29, creatinine 1.51, GFR 33 Small bowel enteroscopy 7/3 showed  2 cm hiatal hernia, LA grade a esophagitis, 2 nonbleeding angiodysplastic lesions in the duodenum (treated with APC).  Normal mucosa in entire examined duodenum.  3 nonbleeding angiodysplastic lesions in jejunum (treated with APC).  Normal mucosa in jejunum. - extensive comorbidites and recurrent overt bleeding in the setting of Eliquis. Suspect recurrent bleeding likely becoming more frequent by recent cessation of Sandostatin that was just restarted Wednesday 7/17 - Continue daily CBC and transfuse as needed to maintain HGB > 7  - Conservative management with PPI infusion and close monitoring - Unless significant drop in hgb or hemodynamic instability, no plan for procedures at this time.  - Clear liquids okay  Afib on Eliquis - last dose Eliquis 7/21 AM  HFpEF with severe RV dysfunction  History of blood transfusion reaction   Neuroendocrine tumors of pancreatic head, T3N0 by EUS ( [place]). Surgery wasn't recommended in multifocal NET and given age and co-morbidities. Treated with sandostatin injections   Thank you for your kind  consultation, we will continue to follow.  Sumner Boesch Leanna Sato  11/25/2022, 9:38 AM

## 2022-11-25 NOTE — ED Notes (Signed)
Blood consent e-signed at this time.

## 2022-11-26 DIAGNOSIS — K922 Gastrointestinal hemorrhage, unspecified: Secondary | ICD-10-CM | POA: Diagnosis not present

## 2022-11-26 LAB — TYPE AND SCREEN: ABO/RH(D): A NEG

## 2022-11-26 LAB — COMPREHENSIVE METABOLIC PANEL
ALT: 15 U/L (ref 0–44)
AST: 22 U/L (ref 15–41)
Albumin: 3.3 g/dL — ABNORMAL LOW (ref 3.5–5.0)
Alkaline Phosphatase: 40 U/L (ref 38–126)
Anion gap: 7 (ref 5–15)
BUN: 31 mg/dL — ABNORMAL HIGH (ref 8–23)
CO2: 28 mmol/L (ref 22–32)
Calcium: 8.4 mg/dL — ABNORMAL LOW (ref 8.9–10.3)
Chloride: 101 mmol/L (ref 98–111)
Creatinine, Ser: 1.48 mg/dL — ABNORMAL HIGH (ref 0.44–1.00)
GFR, Estimated: 34 mL/min — ABNORMAL LOW (ref >60)
Glucose, Bld: 133 mg/dL — ABNORMAL HIGH (ref 70–99)
Potassium: 4.4 mmol/L (ref 3.5–5.1)
Sodium: 136 mmol/L (ref 135–145)
Total Bilirubin: 0.7 mg/dL (ref 0.3–1.2)
Total Protein: 5.6 g/dL — ABNORMAL LOW (ref 6.5–8.1)

## 2022-11-26 LAB — 5 HIAA, QUANTITATIVE, URINE, 24 HOUR
5-HIAA, Ur: 2.5 mg/L
5-HIAA,Quant.,24 Hr Urine: 3.3 mg/24 hr (ref 0.0–14.9)
Total Volume: 1300

## 2022-11-26 LAB — HEMOGLOBIN AND HEMATOCRIT, BLOOD
HCT: 26.4 % — ABNORMAL LOW (ref 36.0–46.0)
Hemoglobin: 7.8 g/dL — ABNORMAL LOW (ref 12.0–15.0)

## 2022-11-26 LAB — BPAM RBC
Blood Product Expiration Date: 202408162359
Unit Type and Rh: 600
Unit Type and Rh: 6200

## 2022-11-26 MED ORDER — TORSEMIDE 20 MG PO TABS
80.0000 mg | ORAL_TABLET | Freq: Two times a day (BID) | ORAL | Status: DC
  Administered 2022-11-26: 80 mg via ORAL
  Filled 2022-11-26: qty 4

## 2022-11-26 NOTE — ED Provider Notes (Signed)
[facility] 4E CV SURGICAL PROGRESSIVE CARE Provider Note   CSN: [id number] Arrival date & time: 11/25/22  0220     History  Chief Complaint  Patient presents with   Fatigue   GI Bleeding    [name] is a 89 y.o. female.  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  Similar to previous but seems to be progressively worsening and now is having fatigue and lightheadedness with that as well.  Has seen [facility] in the past.  Dr. Meridee Score did her last EGD.        Home Medications Prior to Admission medications   Medication Sig Start Date End Date Taking? Authorizing Provider  acetaminophen (TYLENOL) 500 MG tablet Take 1,000 mg by mouth every 8 (eight) hours as needed for mild pain or headache.   Yes [provider]  apixaban (ELIQUIS) 2.5 MG TABS tablet Take 1 tablet (2.5 mg total) by mouth 2 (two) times daily. 11/10/22  Yes Rai, Ripudeep K, MD  Cyanocobalamin (B-12 PO) Take 1 tablet by mouth daily.   Yes [provider]  ferrous gluconate (FERGON) 324 MG tablet Take 1 tablet (324 mg total) by mouth daily with breakfast. 11/15/22  Yes Rai, Ripudeep K, MD  fluticasone (FLONASE) 50 MCG/ACT nasal spray Place 2 sprays into both nostrils daily as needed for allergies or rhinitis. 07/04/22  Yes [name], MD  pantoprazole (PROTONIX) 40 MG tablet Take 1 tablet (40 mg total) by mouth daily. 11/09/22  Yes Rai, Ripudeep K, MD  potassium chloride SA (KLOR-CON M) 20 MEQ tablet Take 1 tablet (20 mEq total) by mouth 2 (two) times daily. Patient taking differently: Take 20 mEq by mouth 3 (three) times daily. 11/09/22  Yes Rai, Ripudeep K, MD  rosuvastatin (CRESTOR) 40 MG tablet Take 1 tablet (40 mg total) by mouth daily. Patient taking differently: Take 40 mg by mouth every evening. 05/02/22  Yes [name], MD   torsemide (DEMADEX) 20 MG tablet Take 4 tablets (80 mg total) by mouth 2 (two) times daily. 08/21/22  Yes Milford, Anderson Malta, FNP  vitamin C (ASCORBIC ACID) 250 MG tablet Take 500 mg by mouth daily.   Yes [provider]  VITAMIN D PO Take 4,000 Units by mouth daily.   Yes [provider]      Allergies    Patient has no active allergies.    Review of Systems   Review of Systems  Physical Exam Updated Vital Signs BP (!) 116/51 (BP Location: Left Arm)   Pulse 60   Temp 98 F (36.7 C) (Oral)   Resp 16   Ht 5\' 6"  (1.676 m)   Wt 62 kg   SpO2 100%   BMI 22.06 kg/m  Physical Exam Vitals and nursing note reviewed.  Constitutional:      Appearance: She is well-developed.  HENT:     Head: Normocephalic and atraumatic.  Cardiovascular:     Rate and Rhythm: Normal rate and regular rhythm.  Pulmonary:     Effort: No respiratory distress.     Breath sounds: No stridor.  Abdominal:     General: There is no distension.  Musculoskeletal:        General: No swelling or tenderness.     Cervical back: Normal range of motion.  Skin:    Coloration: Skin is pale.  Neurological:  Mental Status: She is alert.     ED Results / Procedures / Treatments   Labs (all labs ordered are listed, but only abnormal results are displayed) Labs Reviewed  COMPREHENSIVE METABOLIC PANEL - Abnormal; Notable for the following components:      Result Value   Glucose, Bld 116 (*)    BUN 29 (*)    Creatinine, Ser 1.51 (*)    GFR, Estimated 33 (*)    All other components within normal limits  CBC - Abnormal; Notable for the following components:   WBC 3.6 (*)    RBC 2.42 (*)    Hemoglobin 6.9 (*)    HCT 24.2 (*)    MCHC 28.5 (*)    RDW 22.8 (*)    All other components within normal limits  HEMOGLOBIN AND HEMATOCRIT, BLOOD - Abnormal; Notable for the following components:   Hemoglobin 6.8 (*)    HCT 23.6 (*)    All other components within normal limits  HEMOGLOBIN AND  HEMATOCRIT, BLOOD - Abnormal; Notable for the following components:   Hemoglobin 7.9 (*)    HCT 27.5 (*)    All other components within normal limits  HEMOGLOBIN AND HEMATOCRIT, BLOOD - Abnormal; Notable for the following components:   Hemoglobin 8.2 (*)    HCT 27.8 (*)    All other components within normal limits  COMPREHENSIVE METABOLIC PANEL - Abnormal; Notable for the following components:   Glucose, Bld 133 (*)    BUN 31 (*)    Creatinine, Ser 1.48 (*)    Calcium 8.4 (*)    Total Protein 5.6 (*)    Albumin 3.3 (*)    GFR, Estimated 34 (*)    All other components within normal limits  VITAMIN B12  FOLATE  IRON AND TIBC  FERRITIN  TYPE AND SCREEN  PREPARE RBC (CROSSMATCH)  PREPARE RBC (CROSSMATCH)    EKG None  Radiology No results found.  Procedures .Critical Care  Performed by: Marily Memos, MD Authorized by: Marily Memos, MD   Critical care provider statement:    Critical care time (minutes):  30   Critical care was necessary to treat or prevent imminent or life-threatening deterioration of the following conditions:  Circulatory failure   Critical care was time spent personally by me on the following activities:  Development of treatment plan with patient or surrogate, discussions with consultants, evaluation of patient's response to treatment, examination of patient, ordering and review of laboratory studies, ordering and review of radiographic studies, ordering and performing treatments and interventions, pulse oximetry, re-evaluation of patient's condition and review of old charts     Medications Ordered in ED Medications  0.9 %  sodium chloride infusion (Manually program via Guardrails IV Fluids) ( Intravenous Not Given 11/25/22 0723)  ferrous gluconate (FERGON) tablet 324 mg (324 mg Oral Given 11/25/22 1002)  ascorbic acid (VITAMIN C) tablet 500 mg (500 mg Oral Given 11/25/22 1002)  dextrose 5 % in lactated ringers infusion ( Intravenous Not Given 11/25/22  1248)  sodium chloride flush (NS) 0.9 % injection 3 mL (3 mLs Intravenous Given 11/25/22 2109)  sodium chloride flush (NS) 0.9 % injection 3 mL (3 mLs Intravenous Given 11/25/22 2110)  0.9 %  sodium chloride infusion (has no administration in time range)  acetaminophen (TYLENOL) tablet 650 mg (has no administration in time range)    Or  acetaminophen (TYLENOL) suppository 650 mg (has no administration in time range)  morphine (PF) 2 MG/ML injection 2 mg (has no  administration in time range)  ondansetron (ZOFRAN) tablet 4 mg (has no administration in time range)    Or  ondansetron (ZOFRAN) injection 4 mg (has no administration in time range)  hydrALAZINE (APRESOLINE) injection 10 mg (has no administration in time range)  pantoprazole (PROTONIX) injection 40 mg (40 mg Intravenous Given 11/25/22 2107)  rosuvastatin (CRESTOR) tablet 40 mg (40 mg Oral Given 11/25/22 1855)  pantoprazole (PROTONIX) 80 mg /NS 100 mL IVPB (0 mg Intravenous Stopped 11/25/22 0656)  acetaminophen (TYLENOL) tablet 1,000 mg (1,000 mg Oral Given 11/25/22 0622)  diphenhydrAMINE (BENADRYL) injection 25 mg (25 mg Intravenous Given 11/25/22 6073)  methylPREDNISolone sodium succinate (SOLU-MEDROL) 125 mg/2 mL injection 125 mg (125 mg Intravenous Given 11/25/22 0622)  0.9 %  sodium chloride infusion (Manually program via Guardrails IV Fluids) (0 mLs Intravenous Stopped 11/25/22 1134)    ED Course/ Medical Decision Making/ A&P                             Medical Decision Making Amount and/or Complexity of Data Reviewed Labs: ordered.  Risk Decision regarding hospitalization.   Symptomatic anemia likely related to recurrent AVM bleed.  Consulted GI via institutional protocol.  Discussed with hospitalist for admission.  Blood transfusion started.  Hemodynamically stable otherwise.   Final Clinical Impression(s) / ED Diagnoses Final diagnoses:  Upper GI bleed    Rx / DC Orders ED Discharge Orders     None          Margarine Grosshans, Barbara Cower, MD 11/26/22 [phone]

## 2022-11-26 NOTE — Progress Notes (Cosign Needed)
Progress Note   LOS: 1 day   Chief Complaint: Melena   Subjective   Patient reports no further bowel movements.  Denies nausea, vomiting, abdominal pain.  Tolerating diet without difficulty.    Objective   Vital signs in last 24 hours: Temp:  [97.4 F (36.3 C)-98.5 F (36.9 C)] 97.4 F (36.3 C) (07/23 1106) Pulse Rate:  [60-66] 60 (07/23 0747) Resp:  [14-20] 14 (07/23 1106) BP: (114-140)/(49-55) 122/49 (07/23 1106) SpO2:  [97 %-100 %] 100 % (07/23 1106) Weight:  [63.9 kg] 63.9 kg (07/23 0634) Last BM Date : 11/25/22 Last BM recorded by nurses in past 5 days Stool Type: Type 4 (Like a smooth, soft sausage or snake) (11/25/2022  9:00 PM)  General:   female in no acute distress  Heart:  Regular rate and rhythm; no murmurs Pulm: Clear anteriorly; no wheezing Abdomen: soft, nondistended, normal bowel sounds in all quadrants. Nontender without guarding. No organomegaly appreciated. Extremities:  No edema Neurologic:  Alert and  oriented x4;  No focal deficits.  Psych:  Cooperative. Normal mood and affect.  Intake/Output from previous day: 07/22 0701 - 07/23 0700 In: 436.3 [P.O.:120; I.V.:31.7; Blood:284.6] Out: -  Intake/Output this shift: Total I/O In: 240 [P.O.:240] Out: -   Studies/Results: No results found.  Lab Results: Recent Labs    11/25/22 0320 11/25/22 1300 11/25/22 1800 11/26/22 1026  WBC 3.6*  --   --   --   HGB 6.8*  6.9* 7.9* 8.2* 7.8*  HCT 23.6*  24.2* 27.5* 27.8* 26.4*  PLT 193  --   --   --    BMET Recent Labs    11/25/22 0320 11/26/22 0119  NA 139 136  K 3.9 4.4  CL 99 101  CO2 31 28  GLUCOSE 116* 133*  BUN 29* 31*  CREATININE 1.51* 1.48*  CALCIUM 9.0 8.4*   LFT Recent Labs    11/26/22 0119  PROT 5.6*  ALBUMIN 3.3*  AST 22  ALT 15  ALKPHOS 40  BILITOT 0.7   PT/INR No results for input(s): "LABPROT", "INR" in the last 72 hours.   Scheduled Meds:  sodium chloride   Intravenous Once   vitamin C  500 mg Oral Daily    ferrous gluconate  324 mg Oral Q breakfast   pantoprazole (PROTONIX) IV  40 mg Intravenous Q12H   rosuvastatin  40 mg Oral QPM   sodium chloride flush  3 mL Intravenous Q12H   torsemide  80 mg Oral BID   Continuous Infusions:  sodium chloride        Patient profile:   [name] is a 89 y.o. 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  Planning to follow-up with oncology in 1 month and increase Sandostatin to twice monthly.   Impression:   Melena, small bowel AVMs, symptomatic anemia Hgb 7.8... Baseline appears 8-9, s/p 1 unit PRBCs yesterday MCV 100 Platelet 193 Iron 93, TIBC 308, saturation 30% Ferritin 287 BUN 31, creatinine 1.48, GFR 34 Small bowel enteroscopy 7/3 showed  2 cm hiatal hernia, LA grade a esophagitis, 2 nonbleeding angiodysplastic lesions in the duodenum (treated  with APC).  Normal mucosa in entire examined duodenum.  3 nonbleeding angiodysplastic lesions in jejunum (treated with APC).  Normal mucosa in jejunum. - Continue daily CBC and transfuse as needed to maintain HGB > 7  - Conservative management.  Can transition to PPI p.o. twice daily - Unless significant drop in hgb or hemodynamic instability, no plan for procedures at this time.  -Advance diet as tolerated   Afib on Eliquis - last dose Eliquis 7/21 AM.  -If no further bleeding and hemoglobin remained stable can consider restarting Eliquis may be tomorrow.   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 Surgery wasn't recommended in multifocal NET and given age and co-morbidities. Treated with sandostatin injections    Thank you for your kind consultation, we will continue to follow.  Bayley Leanna Sato  11/26/2022, 3:10 PM  ----------------------------------------------------------------------------  I have reviewed the chart and I agree with the APP's note, impression and recommendations.  The patient was discharged home before I was able to see her.  Agree with discharge given hgb stability.  Hopefully resuming her octreotide will reduce her bleeding.  Jobani Sabado E. Tomasa Rand, MD [facility]

## 2022-11-26 NOTE — Progress Notes (Signed)
Patient given discharge instructions, medication list and follow up appointments, IV and tele were removed. All questions were answered. Will discharge home as ordered. Camdynn Maranto, Randall An RN

## 2022-11-26 NOTE — Discharge Summary (Signed)
Physician Discharge Summary  [name] ZOX:[id number] DOB: 11/09/1933 DOA: 11/25/2022  PCP: [name], MD  Admit date: 11/25/2022 Discharge date: 11/26/2022  Admitted From: Home Disposition: Home  Recommendations for Outpatient Follow-up:  Follow up with PCP in 1-2 weeks Follow-up with GI and heart failure team as scheduled  Home Health: None Equipment/Devices: None  Discharge Condition: Stable CODE STATUS: DNR Diet recommendation: Low-salt low-fat diet  Brief/Interim Summary: [name] with a complex medical history presents with acute GI bleed and symptomatic anemia in the setting of diverticulosis on Eliquis for A-fib.  GI has been consulted given she was just recently admitted here for similar episode with quite extensive workup revealing an angiodysplasia requiring ablation.  At this time we will hold off on any aggressive intervention or treatment, hemoglobin improved after transfusion this morning.  No further episodes of bleeding at this time.  Will continue to monitor hemoglobin symptoms, disposition likely pending resolution of bleeding.   At this time patient's hemoglobin remained stable posttransfusion, will discontinue Eliquis with plans to follow-up outpatient with heart failure, PCP as well as GI teams to discuss risk versus benefit of continuing anticoagulation given multiple hospitalizations with symptomatic anemia in setting of GI bleed.  Patient otherwise back to baseline, stable for discharge back home with family.  Discharge Diagnoses:  Principal Problem:   Lower GI bleed Active Problems:   Primary pancreatic neuroendocrine tumor   Acute on chronic anemia   Hyperlipidemia   Hypertrophic cardiomyopathy (HCC)   CKD stage 3b, GFR 30-44 ml/min (HCC)   PAD (peripheral artery disease) (HCC)   Chronic atrial fibrillation with RVR (HCC)   CHF (congestive heart failure) (HCC)   History of gastritis   Angiodysplasia of small intestine   Chronic hypoxic  respiratory failure (HCC)   Blood transfusion reaction   Renal cell carcinoma s/p nephrectomy 2003(HCC)   GI bleed    Discharge Instructions   Allergies as of 11/26/2022   No Active Allergies      Medication List     STOP taking these medications    apixaban 2.5 MG Tabs tablet Commonly known as: Eliquis       TAKE these medications    acetaminophen 500 MG tablet Commonly known as: TYLENOL Take 1,000 mg by mouth every 8 (eight) hours as needed for mild pain or headache.   B-12 PO Take 1 tablet by mouth daily.   ferrous gluconate 324 MG tablet Commonly known as: FERGON Take 1 tablet (324 mg total) by mouth daily with breakfast.   fluticasone 50 MCG/ACT nasal spray Commonly known as: FLONASE Place 2 sprays into both nostrils daily as needed for allergies or rhinitis.   pantoprazole 40 MG tablet Commonly known as: PROTONIX Take 1 tablet (40 mg total) by mouth daily.   potassium chloride SA 20 MEQ tablet Commonly known as: KLOR-CON M Take 1 tablet (20 mEq total) by mouth 2 (two) times daily. What changed: when to take this   rosuvastatin 40 MG tablet Commonly known as: CRESTOR Take 1 tablet (40 mg total) by mouth daily. What changed: when to take this   torsemide 20 MG tablet Commonly known as: DEMADEX Take 4 tablets (80 mg total) by mouth 2 (two) times daily.   vitamin C 250 MG tablet Commonly known as: ASCORBIC ACID Take 500 mg by mouth daily.   VITAMIN D PO Take 4,000 Units by mouth daily.        No Active Allergies  Consultations: GI  Procedures/Studies: IR US  CHEST  Result Date: 11/08/2022 INDICATION: Patient with history of CHF, volume overload. Request received for image guided thoracentesis EXAM: CHEST ULTRASOUND COMPARISON:  Chest XR, 11/07/2022. FINDINGS: Small left-sided pleural effusion and small right-sided pleural effusion. No significant pocket of fluid or percutaneous window to allow safe thoracentesis. IMPRESSION: Small volume  of pleural effusions bilaterally. No safe window for percutaneous access. Thoracentesis was NOT performed. Read by: Mina Marble, PA-C Electronically Signed   By: Roanna Banning M.D.   On: 11/08/2022 15:04   DG CHEST PORT 1 VIEW  Result Date: 11/07/2022 CLINICAL DATA:  Weakness, volume overload, leg swelling and shortness of breath EXAM: PORTABLE CHEST 1 VIEW COMPARISON:  02/22/2022 FINDINGS: Stable cardiomediastinal silhouette. CardioMEMS device. Left chest wall ICD. Small bilateral pleural effusions and associated atelectasis. No pneumothorax. No displaced rib fractures. IMPRESSION: Similar left and increased right pleural effusion since 02/22/2022. Electronically Signed   By: Minerva Fester M.D.   On: 11/07/2022 20:36   CT Angio Abd/Pel W and/or Wo Contrast  Result Date: 11/04/2022 CLINICAL DATA:  Lower GI bleed. EXAM: CTA ABDOMEN AND PELVIS WITHOUT AND WITH CONTRAST TECHNIQUE: Multidetector CT imaging of the abdomen and pelvis was performed using the standard protocol during bolus administration of intravenous contrast. Multiplanar reconstructed images and MIPs were obtained and reviewed to evaluate the vascular anatomy. RADIATION DOSE REDUCTION: This exam was performed according to the departmental dose-optimization program which includes automated exposure control, adjustment of the mA and/or kV according to patient size and/or use of iterative reconstruction technique. CONTRAST:  55mL OMNIPAQUE IOHEXOL 350 MG/ML SOLN COMPARISON:  CT abdomen pelvis dated 02/16/2022 and PET CT dated 04/10/2022. FINDINGS: VASCULAR Aorta: Advanced atherosclerotic calcification of the abdominal aorta. No aneurysmal dilatation or dissection. No periaortic fluid collection. Celiac: Atherosclerotic calcification of the origin of the celiac artery. The celiac trunk and its major branches are patent. There is an accessory left hepatic artery from the left gastric artery. SMA: The SMA is patent. Renals: Atherosclerotic  calcification of the origin of the left renal artery. The left renal artery is patent. IMA: The IMA is patent. Inflow: Bilateral common iliac artery stents appear patent. There is moderate atherosclerotic calcification of the iliac arteries. No aneurysmal dilatation or dissection. The iliac arteries remain patent. Proximal Outflow: The visualized proximal outflow is patent. Veins: The IVC is unremarkable.  No portal venous gas. Review of the MIP images confirms the above findings. NON-VASCULAR Lower chest: Partially visualized small bilateral pleural effusions, right greater than left. Cardiac AICD device noted. No intra-abdominal free air or free fluid. Hepatobiliary: There is irregularity of the liver contour consistent with changes of cirrhosis, possibly related to passive congestion and cardiac dysfunction. A 1 cm enhancing focus in the posteroinferior right lobe of the liver (46/6) is not characterized, but may represent a flash filling hemangioma or portal venous shunting. Other etiologies are not excluded. This is similar to the prior CT. Indeterminate 13 mm lesion in the left lobe of the liver demonstrates similar enhancement to the background liver on arterial phase and early washout. Further characterization with MRI without and with contrast recommended. There is mild biliary dilatation, post cholecystectomy. Pancreas: A 4.5 x 4.6 cm enhancing mass in the uncinate process of the pancreas slightly increased since the prior CT. Additional enhancing lesions in the distal body/tail of the pancreas measure up to 11 mm. No dilatation of the main pancreatic duct or gland atrophy. Spleen: Normal in size without focal abnormality. Adrenals/Urinary Tract: Status post prior right nephrectomy. The right  adrenal gland is not visualized and may be surgically absent. There is no hydronephrosis or nephrolithiasis on the left. The left ureter is unremarkable. The urinary bladder is minimally distended and suboptimally  visualized due to streak artifact caused by bilateral total hip arthroplasties. Stomach/Bowel: There is no bowel obstruction or active inflammation. Moderate stool throughout the colon. Scattered colonic diverticula without active inflammatory changes. No evidence of active GI bleed. The appendix is not visualized with certainty. No inflammatory changes identified in the right lower quadrant. Lymphatic: No adenopathy. Reproductive: The uterus is not visualized. Other: None Musculoskeletal: Osteopenia with degenerative changes of the spine. Bilateral total hip arthroplasties. Old L2 compression fracture with anterior wedging. No acute osseous pathology. IMPRESSION: 1. No evidence of active GI bleed. 2. Colonic diverticulosis. No bowel obstruction. 3. Cirrhosis with indeterminate liver lesions. Further characterization with MRI without and with contrast recommended. 4. Enhancing pancreatic masses, slightly increased since the prior CT. 5.  Aortic Atherosclerosis (ICD10-I70.0). Electronically Signed   By: Elgie Collard M.D.   On: 11/04/2022 20:17     Subjective: No acute issues or events overnight   Discharge Exam: Vitals:   11/26/22 0747 11/26/22 1106  BP: (!) 139/55 (!) 122/49  Pulse: 60   Resp:  14  Temp: 98.1 F (36.7 C) (!) 97.4 F (36.3 C)  SpO2: 100% 100%   Vitals:   11/26/22 0319 11/26/22 0634 11/26/22 0747 11/26/22 1106  BP: (!) 116/51  (!) 139/55 (!) 122/49  Pulse: 60  60   Resp: 16   14  Temp: 98 F (36.7 C)  98.1 F (36.7 C) (!) 97.4 F (36.3 C)  TempSrc: Oral  Oral Oral  SpO2: 100% 100% 100% 100%  Weight:  63.9 kg    Height:        General: Pt is alert, awake, not in acute distress Cardiovascular: RRR, S1/S2 +, no rubs, no gallops Respiratory: CTA bilaterally, no wheezing, no rhonchi Abdominal: Soft, NT, ND, bowel sounds + Extremities: Scant to 1+ pitting edema bilateral lower extremity to the mid calf    The results of significant diagnostics from this  hospitalization (including imaging, microbiology, ancillary and laboratory) are listed below for reference.     Microbiology: No results found for this or any previous visit (from the past 240 hour(s)).   Labs: BNP (last 3 results) Recent Labs    06/10/22 1553 11/08/22 0027  BNP 208.4* 251.2*   Basic Metabolic Panel: Recent Labs  Lab 11/20/22 1248 11/25/22 0320 11/26/22 0119  NA 141 139 136  K 4.6 3.9 4.4  CL 102 99 101  CO2 32 31 28  GLUCOSE 85 116* 133*  BUN 29* 29* 31*  CREATININE 1.54* 1.51* 1.48*  CALCIUM 9.3 9.0 8.4*   Liver Function Tests: Recent Labs  Lab 11/20/22 1248 11/25/22 0320 11/26/22 0119  AST 16 19 22   ALT 7 10 15   ALKPHOS 46 40 40  BILITOT 0.7 0.7 0.7  PROT 6.5 6.6 5.6*  ALBUMIN 4.0 3.7 3.3*   CBC: Recent Labs  Lab 11/20/22 1248 11/25/22 0320 11/25/22 1300 11/25/22 1800 11/26/22 1026  WBC 3.7* 3.6*  --   --   --   NEUTROABS 2.9  --   --   --   --   HGB 7.4* 6.8*  6.9* 7.9* 8.2* 7.8*  HCT 25.2* 23.6*  24.2* 27.5* 27.8* 26.4*  MCV 93.3 100.0  --   --   --   PLT 186 193  --   --   --  Anemia work up Recent Labs    11/25/22 0320 11/25/22 1800  VITAMINB12  --  603  FOLATE 29.7  --   FERRITIN  --  287  TIBC  --  308  IRON  --  93   Urinalysis    Component Value Date/Time   COLORURINE YELLOW 08/21/2022 1300   APPEARANCEUR CLEAR 08/21/2022 1300   APPEARANCEUR Cloudy (A) 01/27/2018 1330   LABSPEC 1.016 08/21/2022 1300   PHURINE 5.0 08/21/2022 1300   GLUCOSEU NEGATIVE 08/21/2022 1300   HGBUR NEGATIVE 08/21/2022 1300   HGBUR large 11/16/2009 1202   BILIRUBINUR NEGATIVE 08/21/2022 1300   BILIRUBINUR Negative 11/08/2021 1237   BILIRUBINUR Negative 01/27/2018 1330   KETONESUR NEGATIVE 08/21/2022 1300   PROTEINUR NEGATIVE 08/21/2022 1300   UROBILINOGEN 0.2 11/08/2021 1237   UROBILINOGEN 1.0 08/16/2014 1754   NITRITE NEGATIVE 08/21/2022 1300   LEUKOCYTESUR MODERATE (A) 08/21/2022 1300   Sepsis Labs Recent Labs  Lab  11/20/22 1248 11/25/22 0320  WBC 3.7* 3.6*   Microbiology No results found for this or any previous visit (from the past 240 hour(s)).   Time coordinating discharge: Over 30 minutes  SIGNED:   Azucena Fallen, DO [facility] 11/26/2022, 1:34 PM Pager   If 7PM-7AM, please contact night-coverage [email or web address]

## 2022-11-27 ENCOUNTER — Telehealth: Payer: Self-pay | Admitting: *Deleted

## 2022-11-27 ENCOUNTER — Inpatient Hospital Stay: Payer: PPO

## 2022-11-27 NOTE — Progress Notes (Deleted)
11/27/2022 [name] [id number] Dec 06, 1933  Referring provider: [name], MD Primary GI doctor: Dr. Barron Alvine  ASSESSMENT AND PLAN:   There are no diagnoses linked to this encounter.   Patient Care Team: [name], MD as PCP - General (Family Medicine) Duke Salvia, MD as PCP - Cardiology (Cardiology) Laurey Morale, MD as PCP - Advanced Heart Failure (Cardiology) Antonieta Iba, MD as Consulting Physician (Cardiology) Duke Salvia, MD as Consulting Physician (Cardiology) [name], Latina Craver, MD as Consulting Physician (Vascular Surgery) Creig Hines, MD as Consulting Physician (Hematology and Oncology) Malachy Mood, MD as Consulting Physician (Oncology) Kathyrn Sheriff, [facility] (Inactive) as Pharmacist (Pharmacist)  HISTORY OF PRESENT ILLNESS: 89 y.o. female with a past medical history of A-fib on Eliquis, chronic hypoxia on 2L nasal cannula continuously, carotid artery stenosis, PAD, hypertension, chronic HFpEF, CKD stage III, RCC s/p nephrectomy, small bowel AVMs, ischemic colitis, pancreatic neuroendocrine tumors (on Sandostatin) and others listed below presents for hospital follow-up.  7/1 through 7/6 admission for melena as well as CHF exacerbation, hemoglobin 6.8 at that time, CTA negative for GI bleed, showed cirrhosis, indeterminate liver lesion. 7/3 small bowel nephroscopy showed 2 cm hiatal hernia, LA grade a esophagitis, 2 nonbleeding angiodysplastic lesions in the duodenum (treated with APC). Normal mucosa in entire examined duodenum. 3 nonbleeding angiodysplastic lesions in jejunum (treated with APC). Normal mucosa in jejunum. Was discharged on oral Protonix.   Patient admitted 7/22 through 7/23 for melenic stools.  Admitting hemoglobin 6.8, baseline appears to be between 8 and 9, had ran out of Sandostatin injection couple of months ago. Denies NSAID use, alcohol, tobacco use.   Has been seen by cardiologist in April, patient  has concern for end-stage RV failure.  Goals of care has been discussed.    She {Actions; denies-reports:120008} blood thinner use.  She {Actions; denies-reports:120008} NSAID use.  She {Actions; denies-reports:120008} ETOH use.   She {Actions; denies-reports:120008} tobacco use.  She {Actions; denies-reports:120008} drug use.    She  reports that she quit smoking about 21 years ago. Her smoking use included cigarettes. She started smoking about 61 years ago. She has a 20 pack-year smoking history. She has never used smokeless tobacco. She reports that she does not drink alcohol and does not use drugs.  RELEVANT LABS AND IMAGING: CBC    Component Value Date/Time   WBC 3.6 (L) 11/25/2022 0320   RBC 2.42 (L) 11/25/2022 0320   HGB 7.8 (L) 11/26/2022 1026   HGB 7.4 (L) 11/20/2022 1248   HGB 8.1 (L) 03/22/2019 1415   HCT 26.4 (L) 11/26/2022 1026   HCT 25.9 (L) 03/22/2019 1415   PLT 193 11/25/2022 0320   PLT 186 11/20/2022 1248   PLT 226 03/22/2019 1415   MCV 100.0 11/25/2022 0320   MCV 88 03/22/2019 1415   MCH 28.5 11/25/2022 0320   MCHC 28.5 (L) 11/25/2022 0320   RDW 22.8 (H) 11/25/2022 0320   RDW 19.9 (H) 03/22/2019 1415   LYMPHSABS 0.4 (L) 11/20/2022 1248   LYMPHSABS 0.6 (L) 03/22/2019 1415   MONOABS 0.4 11/20/2022 1248   EOSABS 0.0 11/20/2022 1248   EOSABS 0.2 03/22/2019 1415   BASOSABS 0.0 11/20/2022 1248   BASOSABS 0.0 03/22/2019 1415   Recent Labs    11/04/22 1453 11/06/22 0008 11/07/22 2235 11/08/22 0027 11/09/22 0017 11/20/22 1248 11/25/22 0320 11/25/22 1300 11/25/22 1800 11/26/22 1026  HGB 6.5* 9.2* 8.0* 8.1* 8.2* 7.4* 6.8*  6.9* 7.9* 8.2* 7.8*  CMP     Component Value Date/Time   NA 136 11/26/2022 0119   NA 141 07/14/2019 0000   K 4.4 11/26/2022 0119   CL 101 11/26/2022 0119   CO2 28 11/26/2022 0119   GLUCOSE 133 (H) 11/26/2022 0119   BUN 31 (H) 11/26/2022 0119   BUN 29 (A) 07/14/2019 0000   CREATININE 1.48 (H) 11/26/2022 0119   CREATININE  1.54 (H) 11/20/2022 1248   CREATININE 1.34 (H) 02/12/2022 1655   CALCIUM 8.4 (L) 11/26/2022 0119   PROT 5.6 (L) 11/26/2022 0119   PROT 7.2 06/09/2014 1317   ALBUMIN 3.3 (L) 11/26/2022 0119   ALBUMIN 3.8 06/09/2014 1317   AST 22 11/26/2022 0119   AST 16 11/20/2022 1248   ALT 15 11/26/2022 0119   ALT 7 11/20/2022 1248   ALT 23 06/09/2014 1317   ALKPHOS 40 11/26/2022 0119   ALKPHOS 71 06/09/2014 1317   BILITOT 0.7 11/26/2022 0119   BILITOT 0.7 11/20/2022 1248   GFRNONAA 34 (L) 11/26/2022 0119   GFRNONAA 33 (L) 11/20/2022 1248   GFRAA 39 (L) 12/23/2019 1458      Latest Ref Rng & Units 11/26/2022    1:19 AM 11/25/2022    3:20 AM 11/20/2022   12:48 PM  Hepatic Function  Total Protein 6.5 - 8.1 g/dL 5.6  6.6  6.5   Albumin 3.5 - 5.0 g/dL 3.3  3.7  4.0   AST 15 - 41 U/L 22  19  16    ALT 0 - 44 U/L 15  10  7    Alk Phosphatase 38 - 126 U/L 40  40  46   Total Bilirubin 0.3 - 1.2 mg/dL 0.7  0.7  0.10 Feb 2019 colonoscopy for iron deficiency anemia Good prep.  -Diverticulosis throughout colon  - Patchy, moderate inflammation in the sigmoid colon. I have most concern for ischemic, possibly chronic. Two sites were biopsied. These findings could possibly explain acute on chronic, heme + anemia. - I have concerns about what appear to be ischemic changes in her left colon causing bleeding, anemia. I recommend up to date cardilogy consultation as she has known CHF, Afib and also consider messenteric vascular workup. -    Nov 2020 Capsule study    Oct 2022 Small bowel endoscopy for melena - Normal esophagus. - A few non-bleeding angiodysplastic lesions in the stomach. Treated with a monopolar probe. - Four bleeding angiodysplastic lesions in the duodenum. Treated with a monopolar probe. - A single non-bleeding angiodysplastic lesion in the jejunum. Treated with a monopolar probe. - No specimens collected.   Oct 2023 small bowel endoscopy for melena - Normal esophagus. - Hiatal hernia. -  Gastritis. - Two non-bleeding angioectasias in the duodenum. Treated with APC - The examined portion of the jejunum was normal. - No specimens collected. -    Oct 2023 EGD and EUS EGD impression: - No gross lesions in the entire esophagus. Z-line irregular, 38 cm from the incisors. - 3 cm hiatal hernia. - Erosive gastropathy with no bleeding and no stigmata of recent bleeding. No other gross lesions in the entire stomach. Biopsied. - Duodenal erosion without bleeding -likely site of prior APC. No other gross lesions in the duodenal bulb, in the first portion of the duodenum and in the second portion of the duodenum. - Normal major papilla pointed superiorly.    EUS impression: - A mass was identified in the pancreatic head/uncinate process. Cytology results are pending. However, the endosonographic  appearance is suggestive of a neuroendocrine tumor. This was staged T3 N0 Mx by endosonographic criteria. The staging applies if malignancy is confirmed. Fine needle biopsy performed. -2 lesions were identified in the pancreatic tail. Cytology results are pending. However, the endosonographic appearance is suspicious for potential synchronous neuroendocrine tumors. Fine needle biopsy performed. - There was no sign of significant pathology in the common bile duct and in the common hepatic duct. - No malignant-appearing lymph nodes were visualized in the celiac region (level 20), peripancreatic region and porta hepatis region.   FINAL MICROSCOPIC DIAGNOSIS:  - Neuroendocrine neoplasm  The cytologic features are most consistent with pancreatic  neuroendocrine neoplasm.  A. GASTRIC BIOPSY:  Fragments of gastric mucosa with minimal vascular ectasia.  H. pylori, intestinal metaplasia, atrophy and dysplasia are not  identified.       Current Medications:    Current Outpatient Medications (Cardiovascular):    rosuvastatin (CRESTOR) 40 MG tablet, Take 1 tablet (40 mg total) by mouth daily. (Patient taking  differently: Take 40 mg by mouth every evening.)   torsemide (DEMADEX) 20 MG tablet, Take 4 tablets (80 mg total) by mouth 2 (two) times daily.  Current Outpatient Medications (Respiratory):    fluticasone (FLONASE) 50 MCG/ACT nasal spray, Place 2 sprays into both nostrils daily as needed for allergies or rhinitis.  Current Outpatient Medications (Analgesics):    acetaminophen (TYLENOL) 500 MG tablet, Take 1,000 mg by mouth every 8 (eight) hours as needed for mild pain or headache.  Current Outpatient Medications (Hematological):    Cyanocobalamin (B-12 PO), Take 1 tablet by mouth daily.   ferrous gluconate (FERGON) 324 MG tablet, Take 1 tablet (324 mg total) by mouth daily with breakfast.  Current Outpatient Medications (Other):    pantoprazole (PROTONIX) 40 MG tablet, Take 1 tablet (40 mg total) by mouth daily.   potassium chloride SA (KLOR-CON M) 20 MEQ tablet, Take 1 tablet (20 mEq total) by mouth 2 (two) times daily. (Patient taking differently: Take 20 mEq by mouth 3 (three) times daily.)   vitamin C (ASCORBIC ACID) 250 MG tablet, Take 500 mg by mouth daily.   VITAMIN D PO, Take 4,000 Units by mouth daily.  Medical History:  Past Medical History:  Diagnosis Date   (HFpEF) heart failure with preserved ejection fraction (HCC)    EF=60-60%   Actinic keratosis 01/17/2015   R forearm   Adjustment disorder with anxiety    Adverse effect of other narcotics, sequela    Intolerance to all narcotics   Anti-Duffy antibodies present    Aortic valve stenosis    Arthritis    "some in my hands" (11/11/2012)   Atrial fibrillation, permanent (HCC)    Eliquis   Atypical mole 03/25/2018   L forearm - severe   Automatic implantable cardioverter-defibrillator in situ    Avascular necrosis of hip (HCC) 05/03/2011   Carotid artery stenosis 09/2007   a. 09/2007: 60-79% bilateral (stable); b. 10/2008: 40-59% R 60-79%    Cellulitis of left lower extremity 07/13/2020   CKD (chronic kidney disease),  stage III (HCC)    COPD (chronic obstructive pulmonary disease) (HCC)    Coronary artery disease    non-obstructive by 2006 cath   COVID-19 virus infection 02/12/2022   Displaced fracture of left femoral neck (HCC) 10/09/2018   GI bleed 03/28/2020   AVM   High cholesterol    Hypertension 05/20/2011   Hypertr obst cardiomyop    Hypotension, unspecified    cardiac cath 2006..nonobstructive CAD 30-40s lesions.Marland KitchenETT  1/09 nondiagnostic due to poor HR response..Right Renal Cancer 2003   Iron deficiency anemia    Long term (current) use of anticoagulants    On home oxygen therapy    3L [place]   Osteoarthritis of right hip    PAD (peripheral artery disease) (HCC)    Pancreatic cancer (HCC)    sandostatin   PONV (postoperative nausea and vomiting)    Presence of permanent cardiac pacemaker    Pulmonary HTN (HCC)    RECTAL BLEEDING 10/13/2009   Qualifier: Diagnosis of  By: Wilmon Pali NP, Paula     Red blood cell antibody positive with compatible PRBC difficult to obtain    Anti FYA (Duffy a) antibody. Must be transfused with PRB which are Duffy Antigen Negative and Crossmatch Compatible   Renal cell carcinoma (HCC)    s/p nephrectomy   Squamous cell carcinoma of skin 01/17/2015   R lat wrist   Squamous cell carcinoma of skin 01/29/2018   R post upper leg - superficially invasive   Squamous cell carcinoma of skin 03/25/2018   L lat foot   Squamous cell carcinoma of skin 11/09/2020   left lat foot - EDC 01/01/21, recurrent 01/31/21 - MOHs 02/22/21   Squamous cell carcinoma of skin 12/19/2021   Right Posterior Medial Thigh, EDC   Squamous cell carcinoma of skin 12/19/2021   SCC IS, L lat heel, EDC 02/04/2022   Squamous cell carcinoma of skin 01/21/2022   L forearm, EDC 02/04/2022   Squamous cell carcinoma of skin 01/21/2022   R lower leg below knee, EDC 02/04/2022   Squamous cell carcinoma of skin 01/21/2022   SCCIS, R post heel, EDC 02/04/2022   Squamous cell carcinoma of skin 07/01/2022    left lower abdomen, in situ, EDC   Squamous cell carcinoma of skin 07/01/2022   left medial chest, EDC   Urge incontinence    Venous stasis of both lower extremities    Allergies: No Active Allergies   Surgical History:  She  has a past surgical history that includes Abdominal hysterectomy (1975); Cardiac catheterization (2006); Nephrectomy (Right, 06/2001); Cataract extraction w/ intraocular lens  implant, bilateral (01/2006-02-2006); Insert / replace / remove pacemaker (05-01-11); Appendectomy; Total hip arthroplasty (Right, 05/03/2011); Laparoscopic cholecystectomy (11/11/2012); Bi-ventricular pacemaker upgrade (05/04/2010); Joint replacement; Cholecystectomy (N/A, 11/11/2012); Laparoscopic lysis of adhesions (N/A, 11/11/2012); Cardiac catheterization (N/A, 02/21/2016); CARDIOVERSION (N/A, 02/20/2018); CARDIOVERSION (N/A, 03/27/2018); Total hip arthroplasty (Left, 10/09/2018); RIGHT HEART CATH (N/A, 11/09/2018); PRESSURE SENSOR/CARDIOMEMS (N/A, 02/03/2019); Esophagogastroduodenoscopy (egd) with propofol (N/A, 02/28/2019); Colonoscopy with propofol (N/A, 02/28/2019); Submucosal tattoo injection (02/28/2019); biopsy (02/28/2019); RIGHT HEART CATH (N/A, 03/08/2019); [place]ivens capsule study (N/A, 03/15/2019); enteroscopy (N/A, 03/30/2020); Hot hemostasis (N/A, 03/30/2020); enteroscopy (N/A, 02/10/2021); Hot hemostasis (N/A, 02/10/2021); ABDOMINAL AORTOGRAM W/LOWER EXTREMITY (N/A, 02/12/2021); PERIPHERAL VASCULAR INTERVENTION (Bilateral, 02/12/2021); IR US Guide Vasc Access Right (02/16/2022); IR Angiogram Visceral Selective (02/16/2022); IR EMBO ART  VEN HEMORR LYMPH EXTRAV  INC GUIDE ROADMAPPING (02/16/2022); IR Angiogram Visceral Selective (02/16/2022); IR Angiogram Selective Each Additional Vessel (02/18/2022); IR Angiogram Selective Each Additional Vessel (02/18/2022); enteroscopy (N/A, 02/15/2022); Hot hemostasis (N/A, 02/15/2022); IR THORACENTESIS ASP PLEURAL SPACE W/IMG GUIDE (02/21/2022); IR THORACENTESIS ASP PLEURAL  SPACE W/IMG GUIDE (02/22/2022); EUS (N/A, 03/04/2022); Esophagogastroduodenoscopy (N/A, 03/04/2022); biopsy (03/04/2022); Fine needle aspiration (N/A, 03/04/2022); enteroscopy (N/A, 11/06/2022); Hot hemostasis (N/A, 11/06/2022); and Submucosal tattoo injection (11/06/2022). Family History:  Her family history includes Breast cancer in her cousin and another family member; Breast cancer (age of onset: 59) in her maternal aunt; Early death in her  father; Heart failure in her mother.  REVIEW OF SYSTEMS  : All other systems reviewed and negative except where noted in the History of Present Illness.  PHYSICAL EXAM: There were no vitals taken for this visit. General Appearance: Well nourished, in no apparent distress. Head:   Normocephalic and atraumatic. Eyes:  sclerae anicteric,conjunctive pink  Respiratory: Respiratory effort normal, BS equal bilaterally without rales, rhonchi, wheezing. Cardio: RRR with no MRGs. Peripheral pulses intact.  Abdomen: Soft,  {BlankSingle:19197::"Flat","Obese","Non-distended"} ,active bowel sounds. {actendernessAB:27319} tenderness {anatomy; site abdomen:5010}. {BlankMultiple:19196::"Without guarding","With guarding","Without rebound","With rebound"}. No masses. Rectal: {acrectalexam:27461} Musculoskeletal: Full ROM, {PSY - GAIT AND STATION:22860} gait. {With/Without:304960234} edema. Skin:  Dry and intact without significant lesions or rashes Neuro: Alert and  oriented x4;  No focal deficits. Psych:  Cooperative. Normal mood and affect.    Doree Albee, PA-C 1:03 PM

## 2022-11-27 NOTE — Transitions of Care (Post Inpatient/ED Visit) (Signed)
11/27/2022  Name: [name] MRN: [id number] DOB: 1934-02-12  Today's TOC FU Call Status: Today's TOC FU Call Status:: Successful TOC FU Call Competed TOC FU Call Complete Date: 11/27/22  Transition Care Management Follow-up Telephone Call Date of Discharge: 11/26/22 Discharge Facility: [facility]) Primary Inpatient Discharge Diagnosis:: Lower GI bleed How have you been since you were released from the hospital?: Better (still weak) Any questions or concerns?: No  Items Reviewed: Did you receive and understand the discharge instructions provided?: Yes Medications obtained,verified, and reconciled?: Yes (Medications Reviewed) Dietary orders reviewed?: No Do you have support at home?: Yes People in Home: alone Name of Support/Comfort Primary Source: susan daughter and granddaughter  Medications Reviewed Today: Medications Reviewed Today     Reviewed by Luella Cook, RN (Case Manager) on 11/27/22 at 1146  Med List Status: <None>   Medication Order Taking? Sig Documenting Provider Last Dose Status Informant  acetaminophen (TYLENOL) 500 MG tablet [id number] Yes Take 1,000 mg by mouth every 8 (eight) hours as needed for mild pain or headache. [provider] Taking Active Self  Cyanocobalamin (B-12 PO) [id number] Yes Take 1 tablet by mouth daily. [provider] Taking Active Self           Med Note (NEWCOMER Elmer Sow   Tue Jun 27, 2020 12:32 PM)    ferrous gluconate (FERGON) 324 MG tablet [id number] Yes Take 1 tablet (324 mg total) by mouth daily with breakfast. Rai, Delene Ruffini, MD Taking Active Self  fluticasone (FLONASE) 50 MCG/ACT nasal spray [id number] Yes Place 2 sprays into both nostrils daily as needed for allergies or rhinitis. [name], MD Taking Active Self  pantoprazole (PROTONIX) 40 MG tablet [id number] Yes Take 1 tablet (40 mg total) by mouth daily. Rai, Delene Ruffini, MD Taking Active Self  potassium chloride SA (KLOR-CON M)  20 MEQ tablet [id number] Yes Take 1 tablet (20 mEq total) by mouth 2 (two) times daily.  Patient taking differently: Take 20 mEq by mouth 3 (three) times daily.   Cathren Harsh, MD Taking Active Self           Med Note ([place], [place] Nov 25, 2022  6:53 AM) Change in therapy due to torsemide 80mg  BID  rosuvastatin (CRESTOR) 40 MG tablet [id number] Yes Take 1 tablet (40 mg total) by mouth daily.  Patient taking differently: Take 40 mg by mouth every evening.   [name], MD Taking Active Self  torsemide [facility]) 20 MG tablet [id number] Yes Take 4 tablets (80 mg total) by mouth 2 (two) times daily. [place], Anderson Malta, FNP Taking Active Self  vitamin C (ASCORBIC ACID) 250 MG tablet [id number] Yes Take 500 mg by mouth daily. [provider] Taking Active Self           Med Note Truddie Coco May 28, 2022  2:57 PM)    VITAMIN D PO [id number]  Take 4,000 Units by mouth daily. [provider]  Active Self            Home Care and Equipment/Supplies: Were Home Health Services Ordered?: NA Any new equipment or medical supplies ordered?: NA  Functional Questionnaire: Do you need assistance with bathing/showering or dressing?: No Do you need assistance with meal preparation?: Yes Do you need assistance with eating?: No Do you have difficulty maintaining continence: No Do you need assistance with getting out of bed/getting out of a chair/moving?: No Do you have difficulty  managing or taking your medications?: No  Follow up appointments reviewed: PCP Follow-up appointment confirmed?: No MD Provider Line [phone] Given: Yes (per patient she will call and make appt. Pt refused RN to assist.) Specialist Hospital Follow-up appointment confirmed?: Yes Date of Specialist follow-up appointment?: 11/28/22 Follow-Up Specialty Provider:: [phone] Maryfrances Bunnell, [id number] [facility] Do you need transportation to your follow-up appointment?: No Do  you understand care options if your condition(s) worsen?: Yes-patient verbalized understanding  SDOH Interventions Today    Flowsheet Row Most Recent Value  SDOH Interventions   Food Insecurity Interventions Intervention Not Indicated  Housing Interventions Intervention Not Indicated  Transportation Interventions Intervention Not Indicated, Patient Resources (Friends/Family)  [Per patient daughter and grand daughter will take her]      Interventions Today    Flowsheet Row Most Recent Value  General Interventions   General Interventions Discussed/Reviewed General Interventions Discussed, General Interventions Reviewed, Doctor Visits  Doctor Visits Discussed/Reviewed Doctor Visits Discussed, Doctor Visits Reviewed  [patient refuse RN assist with making appt.]  Pharmacy Interventions   Pharmacy Dicussed/Reviewed Pharmacy Topics Discussed      TOC Interventions Today    Flowsheet Row Most Recent Value  TOC Interventions   TOC Interventions Discussed/Reviewed TOC Interventions Discussed, TOC Interventions Reviewed        Gean Maidens BSN RN [facility] [phone]

## 2022-11-28 ENCOUNTER — Ambulatory Visit: Payer: PPO | Admitting: Physician Assistant

## 2022-11-28 NOTE — Progress Notes (Signed)
  Care Coordination  Outreach Note  11/28/2022 Name: [name] MRN: [id number] DOB: [birth year]   Care Coordination Outreach Attempts: A second unsuccessful outreach was attempted today to offer the patient with information about available care coordination services.  Follow Up Plan:  Additional outreach attempts will be made to offer the patient care coordination information and services.   Encounter Outcome:  No Answer  Burman Nieves, CCMA Care Coordination Care Guide Direct Dial: [phone]

## 2022-11-29 NOTE — Progress Notes (Signed)
  Care Coordination   Note   11/29/2022 Name: [name] MRN: [id number] DOB: 1934/02/26  [name] is a 89 y.o. year old female who sees [name], MD for primary care. I reached out to Franklyn Lor by phone today to offer care coordination services.  Ms. Banke was given information about Care Coordination services today including:   The Care Coordination services include support from the care team which includes your Nurse Coordinator, Clinical Social Worker, or Pharmacist.  The Care Coordination team is here to help remove barriers to the health concerns and goals most important to you. Care Coordination services are voluntary, and the patient may decline or stop services at any time by request to their care team member.   Care Coordination Consent Status: Patient agreed to services and verbal consent obtained.   Follow up plan:  Telephone appointment with care coordination team member scheduled for:  12/06/2022  Encounter Outcome:  Pt. Scheduled from referral   Burman Nieves, [facility] Care Coordination Care Guide Direct Dial: [phone]

## 2022-12-02 ENCOUNTER — Telehealth (HOSPITAL_COMMUNITY): Payer: Self-pay

## 2022-12-02 ENCOUNTER — Encounter (HOSPITAL_COMMUNITY): Payer: PPO

## 2022-12-02 NOTE — Telephone Encounter (Signed)
Patient called to report that she has to cancel her appointment today due to a death in her family however she wanted to know if she needs to remain off of the Eliquis and reports that she has had a nagging Cough and her feet are swollen. The last time she weighed she was up 4 pounds. Please advise.

## 2022-12-02 NOTE — Telephone Encounter (Signed)
Patient advised and verbalized understanding 

## 2022-12-04 ENCOUNTER — Inpatient Hospital Stay: Payer: PPO

## 2022-12-04 ENCOUNTER — Telehealth: Payer: Self-pay | Admitting: Family Medicine

## 2022-12-04 NOTE — Telephone Encounter (Signed)
Please schedule at 12pm on Friday may double book.

## 2022-12-04 NOTE — Telephone Encounter (Signed)
Sorry - meant Monday - schedule at 12 noon on Monday. Thanks.

## 2022-12-04 NOTE — Telephone Encounter (Signed)
Patient called to make appt on Monday 8/5 for HFU, advised that Dr. Reece Agar is full for this day and offered appointments for later in the week. Caller says she only has a ride for Monday and would not be able to make it to the office from Tuesday-Friday. Patient asked if she may see someone else for the follow up, advised that instead of that I would send a message to Dr. Reece Agar and see if he may work her in on Monday if possible. Patient can be reached at [phone] if needed.

## 2022-12-05 NOTE — Telephone Encounter (Signed)
Noted  

## 2022-12-05 NOTE — Telephone Encounter (Signed)
Scheduled patient for 12 pm Monday 8/6

## 2022-12-06 ENCOUNTER — Telehealth: Payer: Self-pay | Admitting: *Deleted

## 2022-12-06 ENCOUNTER — Ambulatory Visit: Payer: Self-pay

## 2022-12-06 NOTE — Patient Outreach (Signed)
  Care Coordination   Follow Up Visit Note   12/06/2022 Name: [name] MRN: [id number] DOB: Aug 24, 1933  [name] is a 89 y.o. year old female who sees [name] Boyden, MD for primary care. I spoke with  [name] by phone today.  RN called patient to make her aware that [name] would be calling @ 1:15. Per patient she doesn't feel she needs a Care Coordination call. She understands all her medications and care.    SDOH assessments and interventions completed:  Yes     Care Coordination Interventions:  Yes, provided   Follow up plan: No further intervention required.   Encounter Outcome:  Pt. Visit Completed   [name] BSN RN [facility] [phone]

## 2022-12-06 NOTE — Patient Outreach (Signed)
  Care Coordination   Collaboration  Visit Note   12/06/2022 Name: [name] MRN: [id number] DOB: 02-22-1934  [name] is a 89 y.o. year old female who sees [name], MD for primary care.   What matters to the patients health and wellness today?  Collaboration with Gean Maidens, RN.  Mrs. Pleasant states she spoke with patient and patient has declined care coordination services.     SDOH assessments and interventions completed:  No     Care Coordination Interventions:  No, not indicated   Follow up plan: No further intervention required.   Encounter Outcome:  Pt. Visit Completed   George Ina RN,BSN,CCM [facility] Care Coordination [phone] direct line

## 2022-12-09 ENCOUNTER — Telehealth: Payer: Self-pay

## 2022-12-09 ENCOUNTER — Ambulatory Visit (INDEPENDENT_AMBULATORY_CARE_PROVIDER_SITE_OTHER): Payer: PPO | Admitting: Family Medicine

## 2022-12-09 ENCOUNTER — Ambulatory Visit: Payer: PPO

## 2022-12-09 ENCOUNTER — Encounter: Payer: Self-pay | Admitting: Family Medicine

## 2022-12-09 ENCOUNTER — Ambulatory Visit (INDEPENDENT_AMBULATORY_CARE_PROVIDER_SITE_OTHER)
Admission: RE | Admit: 2022-12-09 | Discharge: 2022-12-09 | Disposition: A | Discharge status: Home / Self Care | Payer: PPO | Source: Ambulatory Visit | Attending: Family Medicine | Admitting: Family Medicine

## 2022-12-09 VITALS — BP 122/64 | HR 62 | Temp 98.0°F | Ht 66.0 in | Wt 131.4 lb

## 2022-12-09 DIAGNOSIS — R059 Cough, unspecified: Secondary | ICD-10-CM | POA: Diagnosis not present

## 2022-12-09 DIAGNOSIS — D3A8 Other benign neuroendocrine tumors: Secondary | ICD-10-CM | POA: Diagnosis not present

## 2022-12-09 DIAGNOSIS — Z87898 Personal history of other specified conditions: Secondary | ICD-10-CM

## 2022-12-09 DIAGNOSIS — I5032 Chronic diastolic (congestive) heart failure: Secondary | ICD-10-CM | POA: Diagnosis not present

## 2022-12-09 DIAGNOSIS — D500 Iron deficiency anemia secondary to blood loss (chronic): Secondary | ICD-10-CM

## 2022-12-09 DIAGNOSIS — J9611 Chronic respiratory failure with hypoxia: Secondary | ICD-10-CM

## 2022-12-09 DIAGNOSIS — D649 Anemia, unspecified: Secondary | ICD-10-CM | POA: Diagnosis not present

## 2022-12-09 DIAGNOSIS — Z95810 Presence of automatic (implantable) cardiac defibrillator: Secondary | ICD-10-CM | POA: Diagnosis not present

## 2022-12-09 DIAGNOSIS — N1832 Chronic kidney disease, stage 3b: Secondary | ICD-10-CM

## 2022-12-09 DIAGNOSIS — R051 Acute cough: Secondary | ICD-10-CM | POA: Diagnosis not present

## 2022-12-09 DIAGNOSIS — R911 Solitary pulmonary nodule: Secondary | ICD-10-CM | POA: Diagnosis not present

## 2022-12-09 DIAGNOSIS — Z9981 Dependence on supplemental oxygen: Secondary | ICD-10-CM

## 2022-12-09 DIAGNOSIS — K922 Gastrointestinal hemorrhage, unspecified: Secondary | ICD-10-CM | POA: Diagnosis not present

## 2022-12-09 DIAGNOSIS — J90 Pleural effusion, not elsewhere classified: Secondary | ICD-10-CM

## 2022-12-09 DIAGNOSIS — I4821 Permanent atrial fibrillation: Secondary | ICD-10-CM | POA: Diagnosis not present

## 2022-12-09 DIAGNOSIS — K5520 Angiodysplasia of colon without hemorrhage: Secondary | ICD-10-CM | POA: Diagnosis not present

## 2022-12-09 DIAGNOSIS — J929 Pleural plaque without asbestos: Secondary | ICD-10-CM | POA: Diagnosis not present

## 2022-12-09 DIAGNOSIS — B029 Zoster without complications: Secondary | ICD-10-CM

## 2022-12-09 LAB — CBC WITH DIFFERENTIAL/PLATELET
Basophils Absolute: 0 K/uL (ref 0.0–0.1)
Basophils Relative: 0.7 % (ref 0.0–3.0)
Eosinophils Absolute: 0 K/uL (ref 0.0–0.7)
Eosinophils Relative: 0 % (ref 0.0–5.0)
HCT: 24.7 % — ABNORMAL LOW (ref 36.0–46.0)
Hemoglobin: 7.7 g/dL — CL (ref 12.0–15.0)
Lymphocytes Relative: 13.7 % (ref 12.0–46.0)
Lymphs Abs: 0.4 K/uL — ABNORMAL LOW (ref 0.7–4.0)
MCHC: 31 g/dL (ref 30.0–36.0)
MCV: 93.8 fl (ref 78.0–100.0)
Monocytes Absolute: 0.3 K/uL (ref 0.1–1.0)
Monocytes Relative: 10.7 % (ref 3.0–12.0)
Neutro Abs: 2 K/uL (ref 1.4–7.7)
Neutrophils Relative %: 74.9 % (ref 43.0–77.0)
Platelets: 141 K/uL — ABNORMAL LOW (ref 150.0–400.0)
RBC: 2.64 Mil/uL — ABNORMAL LOW (ref 3.87–5.11)
RDW: 20.8 % — ABNORMAL HIGH (ref 11.5–15.5)
WBC: 2.7 K/uL — ABNORMAL LOW (ref 4.0–10.5)

## 2022-12-09 LAB — RENAL FUNCTION PANEL
Albumin: 4.1 g/dL (ref 3.5–5.2)
BUN: 37 mg/dL — ABNORMAL HIGH (ref 6–23)
CO2: 35 mEq/L — ABNORMAL HIGH (ref 19–32)
Calcium: 9.3 mg/dL (ref 8.4–10.5)
Chloride: 99 mEq/L (ref 96–112)
Creatinine, Ser: 1.51 mg/dL — ABNORMAL HIGH (ref 0.40–1.20)
GFR: 31.06 mL/min — ABNORMAL LOW (ref >60.00)
Glucose, Bld: 108 mg/dL — ABNORMAL HIGH (ref 70–99)
Phosphorus: 3.6 mg/dL (ref 2.3–4.6)
Potassium: 4.4 mEq/L (ref 3.5–5.1)
Sodium: 140 mEq/L (ref 135–145)

## 2022-12-09 MED ORDER — DOXYCYCLINE HYCLATE 100 MG PO TABS: 100.0000 mg | ORAL_TABLET | Freq: Two times a day (BID) | ORAL | 0 refills | Status: DC

## 2022-12-09 NOTE — Assessment & Plan Note (Signed)
Check CXR. If clear, would cover for acute sinusitis given duration of symptoms

## 2022-12-09 NOTE — Assessment & Plan Note (Signed)
Update labs.  

## 2022-12-09 NOTE — Assessment & Plan Note (Signed)
She continues torsemide 80mg  BID, with Kdur BID.

## 2022-12-09 NOTE — Assessment & Plan Note (Deleted)
Aspirin currently on hold - see above.

## 2022-12-09 NOTE — Assessment & Plan Note (Signed)
Due to recurrent GI bleed.  Octreotide stopped for several months during acute shingles flare. Has restarted this past month, hopeful to help decrease risk of bleed back on this medicine.

## 2022-12-09 NOTE — Assessment & Plan Note (Addendum)
Continues 2L supplemental O2 via [place]

## 2022-12-09 NOTE — Patient Instructions (Addendum)
Continue current medicines, continue to hold eliquis.  Labs today  For cough - chest xray today Schedule follow up with GI Dr Barron Alvine.  Will see oncology recommendations regarding eliquis as well.

## 2022-12-09 NOTE — Telephone Encounter (Addendum)
See result note. Overall stable from hosp d/c.

## 2022-12-09 NOTE — Assessment & Plan Note (Signed)
Chronic issue, previously on eliquis, this was held at latest hospitalization due to recurrent GI bleeds.  Reviewed CHADSVASC and HASBLED scores.  She continues oral iron, has noted darker stools recently. Will update labs including renal panel and CBC.  Will await onc recommendations, will also refer to GI for further guidance.

## 2022-12-09 NOTE — Assessment & Plan Note (Deleted)
Chronic issue, previously on eliquis, this was held at latest hospitalization due to recurrent GI bleeds.  Reviewed CHADSVASC and HASBLED scores.  She continues oral iron, has noted darker stools recently. Will update labs including renal panel and CBC.  Will await onc recommendations, will also refer to GI for further guidance.

## 2022-12-09 NOTE — Assessment & Plan Note (Addendum)
Eliquis continues on hold. Continue pantoprazole. Last ablation was early 11/2022.  Saw GI in hospital - refer back to outpatient GI.

## 2022-12-09 NOTE — Assessment & Plan Note (Signed)
Overall improving, notes some ongoing burning discomfort to site of rash.

## 2022-12-09 NOTE — Assessment & Plan Note (Signed)
Appreciate onc/GI care. Continue monthly octreotide infusions

## 2022-12-09 NOTE — Progress Notes (Signed)
Ph: [phone] Fax: [phone]   Patient ID: Franklyn Lor, female    DOB: Oct 12, 1933, 89 y.o.   MRN: [id number]  This visit was conducted in person.  BP 122/64   Pulse 62   Temp 98 F (36.7 C) (Temporal)   Ht 5\' 6"  (1.676 m)   Wt 131 lb 6 oz (59.6 kg)   SpO2 99% Comment: 2 L  BMI 21.20 kg/m    CC: hosp f/u visit  Subjective:   HPI: KYRE KRANTZ is a 89 y.o. female presenting on 12/09/2022 for Hospitalization Follow-up (Admitted on 11/25/22 at [facility], dx upper GI bleed. Pt accompanied by daughter, Lynden Ang. )   Recent hospitalization for recurrent acute GI bleed with symptomatic anemia in setting of eliquis 2.5mg  use for afib. Received 1u pRBC. Discharge Hgb 7.8.  Hospital records reviewed. Med rec performed.  Remaining off eliquis at this time given recurrent GIB. Rec GI and onc input on this prior to restarting. She continues pantoprazole 40mg  daily.  Previous hospitalization at beginning of July for similar GIB that found angiodysplasia s/p ablation. She was started on oral ferrous gluconate at that time.  Most recently received IV Feraheme x2 last month.  To consider colonoscopy +/- capsule endoscopy if recurrent.   She saw black stools for several days after she came home from the hospital, then normalized, now back to dark stools, having 2 BM/day on average.   New cough for the past 2 weeks, mild cough but may be worsening. Now dry cough. Notes head > chest congestion, ears stopped up, intermittent chest tightness and shortness of breath. Notes ongoing leg swelling. No fevers/chills.   H/o shingles to R thigh 08/2022 treated with valtrex - overall better but still has scarring and burning discomfort to area.   Known pancreatic neuroendocrine tumor receiving monthly octreotide infusion latest 11/20/2022. Cirrhosis by imaging. Unable to get liver MRI due to pacemaker. Planned rpt PET in 2 months. No alcohol intake.   Home health not set up.  Other follow up  appointments scheduled: oncology Dr Mosetta Putt 8/13  Last saw GI Dr Barron Alvine 12/2021.  ______________________________________________________________________ Hospital admission: 11/25/2022 Hospital discharge: 11/26/2022 TCM f/u phone call:  performed on 11/27/2022  Recommendations for Outpatient Follow-up:  Follow up with PCP in 1-2 weeks Follow-up with GI and heart failure team as scheduled   Home Health: None Equipment/Devices: None Discharge Condition: Stable CODE STATUS: DNR  Discharge Diagnoses:  Principal Problem:   Lower GI bleed Active Problems:   Primary pancreatic neuroendocrine tumor   Acute on chronic anemia   Hyperlipidemia   Hypertrophic cardiomyopathy (HCC)   CKD stage 3b, GFR 30-44 ml/min (HCC)   PAD (peripheral artery disease) (HCC)   Chronic atrial fibrillation with RVR (HCC)   CHF (congestive heart failure) (HCC)   History of gastritis   Angiodysplasia of small intestine   Chronic hypoxic respiratory failure (HCC)   Blood transfusion reaction   Renal cell carcinoma s/p nephrectomy 2003(HCC)   GI bleed     Relevant past medical, surgical, family and social history reviewed and updated as indicated. Interim medical history since our last visit reviewed. Allergies and medications reviewed and updated. Outpatient Medications Prior to Visit  Medication Sig Dispense Refill   acetaminophen (TYLENOL) 500 MG tablet Take 1,000 mg by mouth every 8 (eight) hours as needed for mild pain or headache.     Cyanocobalamin (B-12 PO) Take 1 tablet by mouth daily.     ferrous gluconate (FERGON) 324 MG tablet  Take 1 tablet (324 mg total) by mouth daily with breakfast. 30 tablet 3   fluticasone (FLONASE) 50 MCG/ACT nasal spray Place 2 sprays into both nostrils daily as needed for allergies or rhinitis. 16 g 0   pantoprazole (PROTONIX) 40 MG tablet Take 1 tablet (40 mg total) by mouth daily. 30 tablet 3   potassium chloride SA (KLOR-CON M) 20 MEQ tablet Take 1 tablet (20 mEq total)  by mouth 2 (two) times daily. (Patient taking differently: Take 20 mEq by mouth 3 (three) times daily.)     rosuvastatin (CRESTOR) 40 MG tablet Take 1 tablet (40 mg total) by mouth daily. (Patient taking differently: Take 40 mg by mouth every evening.) 30 tablet 11   torsemide (DEMADEX) 20 MG tablet Take 4 tablets (80 mg total) by mouth 2 (two) times daily. 240 tablet 6   vitamin C (ASCORBIC ACID) 250 MG tablet Take 500 mg by mouth daily.     VITAMIN D PO Take 4,000 Units by mouth daily.     No facility-administered medications prior to visit.     Per HPI unless specifically indicated in ROS section below Review of Systems  Objective:  BP 122/64   Pulse 62   Temp 98 F (36.7 C) (Temporal)   Ht 5\' 6"  (1.676 m)   Wt 131 lb 6 oz (59.6 kg)   SpO2 99% Comment: 2 L  BMI 21.20 kg/m   Wt Readings from Last 3 Encounters:  12/09/22 131 lb 6 oz (59.6 kg)  11/26/22 140 lb 12.8 oz (63.9 kg)  11/20/22 137 lb 12.8 oz (62.5 kg)      Physical Exam Vitals and nursing note reviewed.  Constitutional:      Appearance: Normal appearance. She is not ill-appearing.  HENT:     Head: Normocephalic and atraumatic.     Mouth/Throat:     Mouth: Mucous membranes are moist.     Pharynx: Oropharynx is clear. No oropharyngeal exudate or posterior oropharyngeal erythema.  Eyes:     Extraocular Movements: Extraocular movements intact.     Pupils: Pupils are equal, round, and reactive to light.  Cardiovascular:     Rate and Rhythm: Normal rate and regular rhythm.     Pulses: Normal pulses.     Heart sounds: Murmur (3/6 systolic) heard.  Pulmonary:     Effort: Pulmonary effort is normal. No respiratory distress.     Breath sounds: No wheezing, rhonchi or rales.     Comments:  LLL crackles Musculoskeletal:        General: Tenderness present.     Cervical back: Normal range of motion and neck supple.     Right lower leg: Edema (tr) present.     Left lower leg: Edema (tr) present.  Skin:    General:  Skin is warm and dry.     Findings: No rash.  Neurological:     Mental Status: She is alert.  Psychiatric:        Mood and Affect: Mood normal.        Behavior: Behavior normal.       Results for orders placed or performed during the hospital encounter of 11/25/22  Comprehensive metabolic panel  Result Value Ref Range   Sodium 139 135 - 145 mmol/L   Potassium 3.9 3.5 - 5.1 mmol/L   Chloride 99 98 - 111 mmol/L   CO2 31 22 - 32 mmol/L   Glucose, Bld 116 (H) 70 - 99 mg/dL   BUN 29 (H)  8 - 23 mg/dL   Creatinine, Ser 5.62 (H) 0.44 - 1.00 mg/dL   Calcium 9.0 8.9 - 13.0 mg/dL   Total Protein 6.6 6.5 - 8.1 g/dL   Albumin 3.7 3.5 - 5.0 g/dL   AST 19 15 - 41 U/L   ALT 10 0 - 44 U/L   Alkaline Phosphatase 40 38 - 126 U/L   Total Bilirubin 0.7 0.3 - 1.2 mg/dL   GFR, Estimated 33 (L) >60 mL/min   Anion gap 9 5 - 15  CBC  Result Value Ref Range   WBC 3.6 (L) 4.0 - 10.5 K/uL   RBC 2.42 (L) 3.87 - 5.11 MIL/uL   Hemoglobin 6.9 (LL) 12.0 - 15.0 g/dL   HCT 86.5 (L) 78.4 - 69.6 %   MCV 100.0 80.0 - 100.0 fL   MCH 28.5 26.0 - 34.0 pg   MCHC 28.5 (L) 30.0 - 36.0 g/dL   RDW 29.5 (H) 28.4 - 13.2 %   Platelets 193 150 - 400 K/uL   nRBC 0.0 0.0 - 0.2 %  Vitamin B12  Result Value Ref Range   Vitamin B-12 603 180 - 914 pg/mL  Folate  Result Value Ref Range   Folate 29.7 >5.9 ng/mL  Hemoglobin and hematocrit, blood  Result Value Ref Range   Hemoglobin 6.8 (LL) 12.0 - 15.0 g/dL   HCT 44.0 (L) 10.2 - 72.5 %  Hemoglobin and hematocrit, blood  Result Value Ref Range   Hemoglobin 7.9 (L) 12.0 - 15.0 g/dL   HCT 36.6 (L) 44.0 - 34.7 %  Hemoglobin and hematocrit, blood  Result Value Ref Range   Hemoglobin 8.2 (L) 12.0 - 15.0 g/dL   HCT 42.5 (L) 95.6 - 38.7 %  Iron and TIBC  Result Value Ref Range   Iron 93 28 - 170 ug/dL   TIBC 564 332 - 951 ug/dL   Saturation Ratios 30 10.4 - 31.8 %   UIBC 215 ug/dL  Ferritin  Result Value Ref Range   Ferritin 287 11 - 307 ng/mL  Comprehensive  metabolic panel  Result Value Ref Range   Sodium 136 135 - 145 mmol/L   Potassium 4.4 3.5 - 5.1 mmol/L   Chloride 101 98 - 111 mmol/L   CO2 28 22 - 32 mmol/L   Glucose, Bld 133 (H) 70 - 99 mg/dL   BUN 31 (H) 8 - 23 mg/dL   Creatinine, Ser 8.84 (H) 0.44 - 1.00 mg/dL   Calcium 8.4 (L) 8.9 - 10.3 mg/dL   Total Protein 5.6 (L) 6.5 - 8.1 g/dL   Albumin 3.3 (L) 3.5 - 5.0 g/dL   AST 22 15 - 41 U/L   ALT 15 0 - 44 U/L   Alkaline Phosphatase 40 38 - 126 U/L   Total Bilirubin 0.7 0.3 - 1.2 mg/dL   GFR, Estimated 34 (L) >60 mL/min   Anion gap 7 5 - 15  Hemoglobin and hematocrit, blood  Result Value Ref Range   Hemoglobin 7.8 (L) 12.0 - 15.0 g/dL   HCT 16.6 (L) 06.3 - 01.6 %  Type and screen [facility]  Result Value Ref Range   ABO/RH(D) A NEG    Antibody Screen POS    Sample Expiration 11/28/2022,2359    Antibody Identification ANTI FYA (Duffy a)    Unit Number [id number]    Blood Component Type RED CELLS,LR    Unit division 00    Status of Unit REL FROM [facility]    Donor AG  Type      NEGATIVE FOR DUFFY A ANTIGEN NEGATIVE FOR KIDD B ANTIGEN NEGATIVE FOR KELL ANTIGEN   Transfusion Status OK TO TRANSFUSE    Crossmatch Result COMPATIBLE    Unit Number [id number]    Blood Component Type RED CELLS,LR    Unit division 00    Status of Unit ISSUED,FINAL    Donor AG Type      NEGATIVE FOR DUFFY A ANTIGEN NEGATIVE FOR KIDD B ANTIGEN NEGATIVE FOR KELL ANTIGEN   Transfusion Status OK TO TRANSFUSE    Crossmatch Result COMPATIBLE   Prepare RBC (crossmatch)  Result Value Ref Range   Order Confirmation      ORDER PROCESSED BY BLOOD BANK Performed at [facility], [address], [place], [place] [address]   Prepare RBC (crossmatch)  Result Value Ref Range   Order Confirmation      ORDER PROCESSED BY BLOOD BANK BB SAMPLE OR UNITS ALREADY AVAILABLE Performed at [facility], [address], [place], [place] [address]   BPAM [facility]  Result Value Ref Range    Blood Product Unit Number [id number]    PRODUCT CODE [id number]    Unit Type and Rh 6200    Blood Product Expiration Date 829937169678    ISSUE DATE / TIME [id number]    Blood Product Unit Number [id number]    PRODUCT CODE [id number]    Unit Type and Rh 0600    Blood Product Expiration Date [id number]    *Note: Due to a large number of results and/or encounters for the requested time period, some results have not been displayed. A complete set of results can be found in Results Review.   CHADSVASC = 8 (10.8% stroke risk/year) HASBLED = 4 (8.9%)  Assessment & Plan:   Problem List Items Addressed This Visit     History of blood transfusion reaction (Chronic)    Needs premedication. Did well with latest transfusion.      Permanent atrial fibrillation    Chronic issue, previously on eliquis, this was held at latest hospitalization due to recurrent GI bleeds.  Reviewed CHADSVASC and HASBLED scores.  She continues oral iron, has noted darker stools recently. Will update labs including renal panel and CBC.  Will await onc recommendations, will also refer to GI for further guidance.       CKD stage 3b, GFR 30-44 ml/min (HCC)    Update labs.       Relevant Orders   CBC with Differential/Platelet   Renal function panel   ICD (implantable cardioverter-defibrillator) in place   Chronic diastolic CHF (congestive heart failure) (HCC)    She continues torsemide 80mg  BID, with Kdur BID.       Acute cough    Check CXR. If clear, would cover for acute sinusitis given duration of symptoms      Relevant Orders   DG Chest 2 View   Chronic bilateral pleural effusions    Did not complete thoracentesis at last hospitalization - insufficient fluid. Update CXR today      Symptomatic anemia    Due to recurrent GI bleed.  Octreotide stopped for several months during acute shingles flare. Has restarted this past month, hopeful to help decrease risk of bleed back on this  medicine.       Relevant Orders   Ambulatory referral to Gastroenterology   GI bleed - Primary    Due to recurrent GI bleed.  Octreotide stopped for several months during acute shingles  flare. Has restarted this past month, hopeful to help decrease risk of bleed back on this medicine.       Relevant Orders   Ambulatory referral to Gastroenterology   Angiodysplasia of small intestine    Eliquis continues on hold. Continue pantoprazole. Last ablation was early 11/2022.  Saw GI in hospital - refer back to outpatient GI.       Relevant Orders   Ambulatory referral to Gastroenterology   Primary pancreatic neuroendocrine tumor    Appreciate onc/GI care. Continue monthly octreotide infusions      Relevant Orders   Ambulatory referral to Gastroenterology   Chronic hypoxic respiratory failure, on home oxygen therapy (HCC)    Continues 2L supplemental O2 via [place]      Herpes zoster    Overall improving, notes some ongoing burning discomfort to site of rash.       Anemia due to chronic blood loss   Relevant Orders   Ambulatory referral to Gastroenterology     Meds ordered this encounter  Medications   doxycycline (VIBRA-TABS) 100 MG tablet    Sig: Take 1 tablet (100 mg total) by mouth 2 (two) times daily.    Dispense:  14 tablet    Refill:  0    Orders Placed This Encounter  Procedures   DG Chest 2 View    Standing Status:   Future    Number of Occurrences:   1    Standing Expiration Date:   12/09/2023    Order Specific Question:   Reason for Exam (SYMPTOM  OR DIAGNOSIS REQUIRED)    Answer:   cough for 2 wks    Order Specific Question:   Preferred imaging location?    Answer:   [facility]   CBC with Differential/Platelet   Renal function panel   Ambulatory referral to Gastroenterology    Referral Priority:   Routine    Referral Type:   Consultation    Referral Reason:   Specialty Services Required    Number of Visits Requested:   1    Patient  Instructions  Continue current medicines, continue to hold eliquis.  Labs today  For cough - chest xray today Schedule follow up with GI Dr Barron Alvine.  Will see oncology recommendations regarding eliquis as well.   Follow up plan: No follow-ups on file.  [name], MD

## 2022-12-09 NOTE — Assessment & Plan Note (Addendum)
Did not complete thoracentesis at last hospitalization - insufficient fluid. Update CXR today

## 2022-12-09 NOTE — Telephone Encounter (Signed)
Hope from [facility] lab called with critically low Hemoglobin at 7.7

## 2022-12-09 NOTE — Assessment & Plan Note (Signed)
Needs premedication. Did well with latest transfusion.

## 2022-12-17 ENCOUNTER — Inpatient Hospital Stay: Payer: PPO | Admitting: Hematology

## 2022-12-17 ENCOUNTER — Encounter: Payer: Self-pay | Admitting: Hematology

## 2022-12-17 ENCOUNTER — Other Ambulatory Visit: Payer: Self-pay

## 2022-12-17 ENCOUNTER — Inpatient Hospital Stay: Payer: PPO

## 2022-12-17 ENCOUNTER — Inpatient Hospital Stay: Payer: PPO | Attending: Nurse Practitioner

## 2022-12-17 VITALS — BP 135/50 | HR 58 | Temp 97.7°F | Resp 18 | Ht 66.0 in | Wt 134.5 lb

## 2022-12-17 DIAGNOSIS — D3A8 Other benign neuroendocrine tumors: Secondary | ICD-10-CM | POA: Diagnosis not present

## 2022-12-17 DIAGNOSIS — D631 Anemia in chronic kidney disease: Secondary | ICD-10-CM | POA: Diagnosis not present

## 2022-12-17 DIAGNOSIS — N1832 Chronic kidney disease, stage 3b: Secondary | ICD-10-CM | POA: Insufficient documentation

## 2022-12-17 DIAGNOSIS — D649 Anemia, unspecified: Secondary | ICD-10-CM

## 2022-12-17 LAB — CMP (CANCER CENTER ONLY)
ALT: 7 U/L (ref 0–44)
AST: 15 U/L (ref 15–41)
Albumin: 4.1 g/dL (ref 3.5–5.0)
Alkaline Phosphatase: 42 U/L (ref 38–126)
Anion gap: 7 (ref 5–15)
BUN: 39 mg/dL — ABNORMAL HIGH (ref 8–23)
CO2: 34 mmol/L — ABNORMAL HIGH (ref 22–32)
Calcium: 9.2 mg/dL (ref 8.9–10.3)
Chloride: 99 mmol/L (ref 98–111)
Creatinine: 1.54 mg/dL — ABNORMAL HIGH (ref 0.44–1.00)
GFR, Estimated: 33 mL/min — ABNORMAL LOW (ref >60)
Glucose, Bld: 95 mg/dL (ref 70–99)
Potassium: 4.7 mmol/L (ref 3.5–5.1)
Sodium: 140 mmol/L (ref 135–145)
Total Bilirubin: 0.5 mg/dL (ref 0.3–1.2)
Total Protein: 7.1 g/dL (ref 6.5–8.1)

## 2022-12-17 LAB — CBC WITH DIFFERENTIAL (CANCER CENTER ONLY)
Abs Immature Granulocytes: 0.01 10*3/uL (ref 0.00–0.07)
Basophils Absolute: 0 10*3/uL (ref 0.0–0.1)
Basophils Relative: 1 %
Eosinophils Absolute: 0 10*3/uL (ref 0.0–0.5)
Eosinophils Relative: 0 %
HCT: 25.3 % — ABNORMAL LOW (ref 36.0–46.0)
Hemoglobin: 7.7 g/dL — ABNORMAL LOW (ref 12.0–15.0)
Immature Granulocytes: 0 %
Lymphocytes Relative: 19 %
Lymphs Abs: 0.5 10*3/uL — ABNORMAL LOW (ref 0.7–4.0)
MCH: 29.4 pg (ref 26.0–34.0)
MCHC: 30.4 g/dL (ref 30.0–36.0)
MCV: 96.6 fL (ref 80.0–100.0)
Monocytes Absolute: 0.4 10*3/uL (ref 0.1–1.0)
Monocytes Relative: 12 %
Neutro Abs: 1.9 10*3/uL (ref 1.7–7.7)
Neutrophils Relative %: 68 %
Platelet Count: 158 10*3/uL (ref 150–400)
RBC: 2.62 MIL/uL — ABNORMAL LOW (ref 3.87–5.11)
RDW: 17.8 % — ABNORMAL HIGH (ref 11.5–15.5)
WBC Count: 2.8 10*3/uL — ABNORMAL LOW (ref 4.0–10.5)
nRBC: 0 % (ref 0.0–0.2)

## 2022-12-17 MED ORDER — OCTREOTIDE ACETATE 20 MG IM KIT
20.0000 mg | PACK | Freq: Once | INTRAMUSCULAR | Status: AC
  Administered 2022-12-17: 20 mg via INTRAMUSCULAR
  Filled 2022-12-17: qty 1

## 2022-12-17 MED ORDER — EPOETIN ALFA-EPBX 20000 UNIT/ML IJ SOLN
20000.0000 [IU] | Freq: Once | INTRAMUSCULAR | Status: AC
  Administered 2022-12-17: 20000 [IU] via SUBCUTANEOUS
  Filled 2022-12-17: qty 1

## 2022-12-17 NOTE — Progress Notes (Signed)
[facility]   Telephone:(336) [phone] Fax:(336) [phone]   Clinic Follow up Note   Patient Care Team: [name], MD as PCP - General (Family Medicine) Duke Salvia, MD as PCP - Cardiology (Cardiology) Laurey Morale, MD as PCP - Advanced Heart Failure (Cardiology) Antonieta Iba, MD as Consulting Physician (Cardiology) Duke Salvia, MD as Consulting Physician (Cardiology) [name], Latina Craver, MD as Consulting Physician (Vascular Surgery) Creig Hines, MD as Consulting Physician (Hematology and Oncology) Malachy Mood, MD as Consulting Physician (Oncology) Kathyrn Sheriff, [facility] (Inactive) as Pharmacist (Pharmacist)  Date of Service:  12/17/2022  CHIEF COMPLAINT: f/u of  NET of the pancreas and anemia   CURRENT THERAPY:   First line Sandostatin injection q28 days, starting 03/27/22   blood transfusion: 2u if Hg<7.0, 1u as needed if hg 7.1-8.0 depends on her symptoms  Premedications for blood transfusion: Benadryl 50 mg, Pepcid 20 mg, insulin management 125 mg Restart Retacrit every 2 weeks on December 19, 2022 Iv feraheme if ferritin<100   ASSESSMENT:  [name] is a 89 y.o. 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   -Outpatient EUS 03/04/2022 by Dr. 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 Freida Busman did not recommend surgery in multifocal NET and also due to her age and co-morbidities. -She began first line sandostatin injections q28 days on 03/27/22.   -Dotatate PET scan, done 2 weeks after first injection 12/6, shows no significant tracer avidity in the pancrease or distant sites. Findings are likely due to close timing relative to the sandostatin injection blocking receptors. When we repeat this for restaging next year, will arrange 4 weeks after sandostatin -She is clinically doing well, tolerating Sandostatin injection well.  Will continue.  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 -She previously had reaction to blood transfusion, or required multiple premedications for blood transfusion -She was recently hospitalized for anemia and a GI bleeding in July 2024. -she will continue follow-up with GI -Due to her previous blood transfusion reaction, she is reluctant to have blood transfusion.  I also called the blood bank, she has multiple antibodies, she will need to additional hours to find matched blood, will give her multiple premedications before blood transfusion -Also has CKD, qualify for ESA, she previously was on Retacrit.  I will restart her today. -I have communicated her to with her GI doctors   PLAN: Lab reviewed -hg 7.7 -Iron study -pending -I will reach out to Dr. Meridee Score regarding her questions regarding  blood transfusion locations  -pt will receive the Sandostatin injection and Retacrit today  -lab and injection every 2 wks and f/u in 6 weeks    INTERVAL HISTORY:  [name] is here for a follow  up of  NET of the pancreas. She was last seen by me on 11/20/2022. She presents to the clinic accompanied by daughter. Pt state that she has no issue with receiving her injection. Pt  was recently hospitalized and  had a blood transfusion.      All other systems were reviewed with the patient and are negative.  MEDICAL HISTORY:  Past Medical History:  Diagnosis Date   (HFpEF) heart failure with preserved ejection fraction (HCC)    EF=60-60%   Actinic keratosis 01/17/2015   R forearm   Adjustment disorder with anxiety    Adverse effect of other narcotics, sequela    Intolerance to all narcotics   Anti-Duffy antibodies present    Aortic valve stenosis    Arthritis    "some in my hands" (11/11/2012)   Atrial fibrillation, permanent (HCC)    Eliquis   Atypical mole 03/25/2018   L forearm - severe   Automatic implantable cardioverter-defibrillator in situ    Avascular necrosis of hip (HCC) 05/03/2011   Carotid artery stenosis 09/2007   a. 09/2007: 60-79% bilateral (stable); b. 10/2008: 40-59% R 60-79%    Cellulitis of left lower extremity 07/13/2020   CKD (chronic kidney disease), stage III (HCC)    COPD (chronic obstructive pulmonary disease) (HCC)    Coronary artery disease    non-obstructive by 2006 cath   COVID-19 virus infection 02/12/2022   Displaced fracture of left femoral neck (HCC) 10/09/2018   GI bleed 03/28/2020   AVM   High cholesterol    Hypertension 05/20/2011   Hypertr obst cardiomyop    Hypotension, unspecified    cardiac cath 2006..nonobstructive CAD 30-40s lesions.Marland KitchenETT 1/09 nondiagnostic due to poor HR response..Right Renal Cancer 2003   Iron deficiency anemia    Long term (current) use of anticoagulants    On home oxygen therapy    3L [place]   Osteoarthritis of right hip    PAD (peripheral artery disease) (HCC)    Pancreatic cancer (HCC)    sandostatin   PONV (postoperative nausea and vomiting)    Presence of permanent cardiac pacemaker    Pulmonary HTN (HCC)    RECTAL BLEEDING 10/13/2009   Qualifier: Diagnosis of  By: Wilmon Pali NP, Paula     Red blood cell antibody positive with compatible PRBC difficult to obtain    Anti FYA (Duffy a) antibody. Must be transfused  with PRB which are Duffy Antigen Negative and Crossmatch Compatible   Renal cell carcinoma (HCC)    s/p nephrectomy   Squamous cell carcinoma of skin 01/17/2015   R lat wrist   Squamous cell carcinoma of skin 01/29/2018   R post upper leg - superficially invasive   Squamous cell carcinoma of skin 03/25/2018   L lat foot   Squamous cell carcinoma of skin 11/09/2020   left lat foot - EDC 01/01/21, recurrent 01/31/21 - MOHs 02/22/21   Squamous cell carcinoma of skin 12/19/2021   Right Posterior Medial Thigh, EDC   Squamous cell carcinoma of skin 12/19/2021   SCC IS, L lat heel, EDC 02/04/2022   Squamous cell carcinoma of skin 01/21/2022   L forearm, EDC 02/04/2022   Squamous cell carcinoma of skin 01/21/2022   R lower leg below knee, EDC 02/04/2022   Squamous cell carcinoma of skin 01/21/2022   SCCIS, R post heel, EDC 02/04/2022   Squamous cell carcinoma of skin 07/01/2022   left lower abdomen, in situ, EDC   Squamous cell carcinoma of skin 07/01/2022  left medial chest, EDC   Urge incontinence    Venous stasis of both lower extremities     SURGICAL HISTORY: Past Surgical History:  Procedure Laterality Date   ABDOMINAL AORTOGRAM W/LOWER EXTREMITY N/A 02/12/2021   Procedure: ABDOMINAL AORTOGRAM W/LOWER EXTREMITY;  Surgeon: Maeola Harman, MD;  Location: [facility] INVASIVE CV LAB;  Service: Cardiovascular;  Laterality: N/A;   ABDOMINAL HYSTERECTOMY  1975   for benign causes   APPENDECTOMY     BI-VENTRICULAR PACEMAKER UPGRADE  05/04/2010   BIOPSY  02/28/2019   Procedure: BIOPSY;  Surgeon: Rachael Fee, MD;  Location: [facility] ENDOSCOPY;  Service: Endoscopy;;   BIOPSY  03/04/2022   Procedure: BIOPSY;  Surgeon: Lemar Lofty., MD;  Location: [facility] ENDOSCOPY;  Service: Gastroenterology;;   CARDIAC CATHETERIZATION  2006   CARDIOVERSION N/A 02/20/2018   Procedure: CARDIOVERSION;  Surgeon: Antonieta Iba, MD;  Location: [facility] ORS;  Service: Cardiovascular;  Laterality: N/A;    CARDIOVERSION N/A 03/27/2018   Procedure: CARDIOVERSION;  Surgeon: Antonieta Iba, MD;  Location: [facility] ORS;  Service: Cardiovascular;  Laterality: N/A;   CATARACT EXTRACTION W/ INTRAOCULAR LENS  IMPLANT, BILATERAL  01/2006-02-2006   CHOLECYSTECTOMY N/A 11/11/2012   Procedure: LAPAROSCOPIC CHOLECYSTECTOMY WITH INTRAOPERATIVE CHOLANGIOGRAM;  Surgeon: Wilmon Arms. Corliss Skains, MD;  Location: [facility] OR;  Service: General;  Laterality: N/A;   COLONOSCOPY WITH PROPOFOL N/A 02/28/2019   Procedure: COLONOSCOPY WITH PROPOFOL;  Surgeon: Rachael Fee, MD;  Location: [facility] ENDOSCOPY;  Service: Endoscopy;  Laterality: N/A;   ENTEROSCOPY N/A 03/30/2020   Procedure: ENTEROSCOPY;  Surgeon: Shellia Cleverly, DO;  Location: [facility] ENDOSCOPY;  Service: Gastroenterology;  Laterality: N/A;   ENTEROSCOPY N/A 02/10/2021   Procedure: ENTEROSCOPY;  Surgeon: Jeani Hawking, MD;  Location: [facility] ENDOSCOPY;  Service: Endoscopy;  Laterality: N/A;   ENTEROSCOPY N/A 02/15/2022   Procedure: ENTEROSCOPY;  Surgeon: Imogene Burn, MD;  Location: [facility] ENDOSCOPY;  Service: Gastroenterology;  Laterality: N/A;   ENTEROSCOPY N/A 11/06/2022   Procedure: ENTEROSCOPY;  Surgeon: Meridee Score Netty Starring., MD;  Location: [facility] ENDOSCOPY;  Service: Gastroenterology;  Laterality: N/A;   EP IMPLANTABLE DEVICE N/A 02/21/2016   Procedure: ICD Generator Changeout;  Surgeon: Duke Salvia, MD;  Location: [facility] INVASIVE CV LAB;  Service: Cardiovascular;  Laterality: N/A;   ESOPHAGOGASTRODUODENOSCOPY N/A 03/04/2022   Procedure: ESOPHAGOGASTRODUODENOSCOPY (EGD);  Surgeon: Lemar Lofty., MD;  Location: [facility] ENDOSCOPY;  Service: Gastroenterology;  Laterality: N/A;   ESOPHAGOGASTRODUODENOSCOPY (EGD) WITH PROPOFOL N/A 02/28/2019   Procedure: ESOPHAGOGASTRODUODENOSCOPY (EGD) WITH PROPOFOL;  Surgeon: Rachael Fee, MD;  Location: [facility] ENDOSCOPY;  Service: Endoscopy;  Laterality: N/A;   EUS N/A 03/04/2022   Procedure: UPPER ENDOSCOPIC ULTRASOUND (EUS) RADIAL;  Surgeon:  Lemar Lofty., MD;  Location: [facility] ENDOSCOPY;  Service: Gastroenterology;  Laterality: N/A;   FINE NEEDLE ASPIRATION N/A 03/04/2022   Procedure: FINE NEEDLE ASPIRATION (FNA) LINEAR;  Surgeon: Lemar Lofty., MD;  Location: [facility] ENDOSCOPY;  Service: Gastroenterology;  Laterality: N/A;   GIVENS CAPSULE STUDY N/A 03/15/2019   Procedure: GIVENS CAPSULE STUDY;  Surgeon: Tressia Danas, MD;  Location: [facility] ENDOSCOPY;  Service: Gastroenterology;  Laterality: N/A;   HOT HEMOSTASIS N/A 03/30/2020   Procedure: HOT HEMOSTASIS (ARGON PLASMA COAGULATION/BICAP);  Surgeon: Shellia Cleverly, DO;  Location: [facility] ENDOSCOPY;  Service: Gastroenterology;  Laterality: N/A;   HOT HEMOSTASIS N/A 02/10/2021   Procedure: HOT HEMOSTASIS (ARGON PLASMA COAGULATION/BICAP);  Surgeon: Jeani Hawking, MD;  Location: [facility] ENDOSCOPY;  Service: Endoscopy;  Laterality: N/A;   HOT HEMOSTASIS N/A 02/15/2022  Procedure: HOT HEMOSTASIS (ARGON PLASMA COAGULATION/BICAP);  Surgeon: Imogene Burn, MD;  Location: [facility] ENDOSCOPY;  Service: Gastroenterology;  Laterality: N/A;   HOT HEMOSTASIS N/A 11/06/2022   Procedure: HOT HEMOSTASIS (ARGON PLASMA COAGULATION/BICAP);  Surgeon: Lemar Lofty., MD;  Location: [facility] ENDOSCOPY;  Service: Gastroenterology;  Laterality: N/A;   INSERT / REPLACE / REMOVE PACEMAKER  05-01-11   02-28-05-/05-04-10-ICD-MEDTRONIC MAXIMAL DR   IR ANGIOGRAM SELECTIVE EACH ADDITIONAL VESSEL  02/18/2022   IR ANGIOGRAM SELECTIVE EACH ADDITIONAL VESSEL  02/18/2022   IR ANGIOGRAM VISCERAL SELECTIVE  02/16/2022   IR ANGIOGRAM VISCERAL SELECTIVE  02/16/2022   IR EMBO ART  VEN HEMORR LYMPH EXTRAV  INC GUIDE ROADMAPPING  02/16/2022   IR THORACENTESIS ASP PLEURAL SPACE W/IMG GUIDE  02/21/2022   IR THORACENTESIS ASP PLEURAL SPACE W/IMG GUIDE  02/22/2022   IR US GUIDE VASC ACCESS RIGHT  02/16/2022   JOINT REPLACEMENT     LAPAROSCOPIC CHOLECYSTECTOMY  11/11/2012   LAPAROSCOPIC LYSIS OF ADHESIONS N/A 11/11/2012    Procedure: LAPAROSCOPIC LYSIS OF ADHESIONS;  Surgeon: Wilmon Arms. Corliss Skains, MD;  Location: [facility] OR;  Service: General;  Laterality: N/A;   NEPHRECTOMY Right 06/2001    S/P RENAL CELL CANCER   PERIPHERAL VASCULAR INTERVENTION Bilateral 02/12/2021   Procedure: PERIPHERAL VASCULAR INTERVENTION;  Surgeon: Maeola Harman, MD;  Location: [facility] INVASIVE CV LAB;  Service: Cardiovascular;  Laterality: Bilateral;  Iliac artery stents   PRESSURE SENSOR/CARDIOMEMS N/A 02/03/2019   Procedure: PRESSURE SENSOR/CARDIOMEMS;  Surgeon: Laurey Morale, MD;  Location: [facility] INVASIVE CV LAB;  Service: Cardiovascular;  Laterality: N/A;   RIGHT HEART CATH N/A 11/09/2018   Procedure: RIGHT HEART CATH;  Surgeon: Laurey Morale, MD;  Location: [facility] INVASIVE CV LAB;  Service: Cardiovascular;  Laterality: N/A;   RIGHT HEART CATH N/A 03/08/2019   Procedure: RIGHT HEART CATH;  Surgeon: Laurey Morale, MD;  Location: [facility] INVASIVE CV LAB;  Service: Cardiovascular;  Laterality: N/A;   SUBMUCOSAL TATTOO INJECTION  02/28/2019   Procedure: SUBMUCOSAL TATTOO INJECTION;  Surgeon: Rachael Fee, MD;  Location: [facility] ENDOSCOPY;  Service: Endoscopy;;   SUBMUCOSAL TATTOO INJECTION  11/06/2022   Procedure: SUBMUCOSAL TATTOO INJECTION;  Surgeon: Lemar Lofty., MD;  Location: [facility] ENDOSCOPY;  Service: Gastroenterology;;   TOTAL HIP ARTHROPLASTY Right 05/03/2011   Procedure: TOTAL HIP ARTHROPLASTY ANTERIOR APPROACH;  Surgeon: Kathryne Hitch;  Location: [facility] ORS;  Service: Orthopedics;  Laterality: Right;  Removal of Cannulated Screws Right Hip, Right Direct Anterior Hip Replacement   TOTAL HIP ARTHROPLASTY Left 10/09/2018   Procedure: TOTAL HIP ARTHROPLASTY ANTERIOR APPROACH;  Surgeon: Samson Frederic, MD;  Location: [facility] OR;  Service: Orthopedics;  Laterality: Left;    I have reviewed the social history and family history with the patient and they are unchanged from previous note.  ALLERGIES:  is allergic to codeine, morphine and  codeine, and amoxicillin.  MEDICATIONS:  Current Outpatient Medications  Medication Sig Dispense Refill   acetaminophen (TYLENOL) 500 MG tablet Take 1,000 mg by mouth every 8 (eight) hours as needed for mild pain or headache.     Cyanocobalamin (B-12 PO) Take 1 tablet by mouth daily.     doxycycline (VIBRA-TABS) 100 MG tablet Take 1 tablet (100 mg total) by mouth 2 (two) times daily. 14 tablet 0   ferrous gluconate (FERGON) 324 MG tablet Take 1 tablet (324 mg total) by mouth daily with breakfast. 30 tablet 3   fluticasone (FLONASE) 50 MCG/ACT nasal spray Place 2 sprays into both nostrils daily  as needed for allergies or rhinitis. 16 g 0   pantoprazole (PROTONIX) 40 MG tablet Take 1 tablet (40 mg total) by mouth daily. 30 tablet 3   potassium chloride SA (KLOR-CON M) 20 MEQ tablet Take 1 tablet (20 mEq total) by mouth 2 (two) times daily. (Patient taking differently: Take 20 mEq by mouth 3 (three) times daily.)     rosuvastatin (CRESTOR) 40 MG tablet Take 1 tablet (40 mg total) by mouth daily. (Patient taking differently: Take 40 mg by mouth every evening.) 30 tablet 11   torsemide (DEMADEX) 20 MG tablet Take 4 tablets (80 mg total) by mouth 2 (two) times daily. 240 tablet 6   vitamin C (ASCORBIC ACID) 250 MG tablet Take 500 mg by mouth daily.     VITAMIN D PO Take 4,000 Units by mouth daily.     No current facility-administered medications for this visit.    PHYSICAL EXAMINATION: ECOG PERFORMANCE STATUS: 2 - Symptomatic, <50% confined to bed  Vitals:   12/17/22 1444  BP: (!) 135/50  Pulse: (!) 58  Resp: 18  Temp: 97.7 F (36.5 C)  SpO2: 100%   Wt Readings from Last 3 Encounters:  12/17/22 134 lb 8 oz (61 kg)  12/09/22 131 lb 6 oz (59.6 kg)  11/26/22 140 lb 12.8 oz (63.9 kg)     GENERAL:alert, no distress and comfortable SKIN: skin color normal, no rashes or significant lesions EYES: normal, Conjunctiva are pink and non-injected, sclera clear  NEURO: alert & oriented x 3 with  fluent speech  LABORATORY DATA:  I have reviewed the data as listed    Latest Ref Rng & Units 12/17/2022    2:05 PM 12/09/2022   12:48 PM 11/26/2022   10:26 AM  CBC  WBC 4.0 - 10.5 K/uL 2.8  2.7    Hemoglobin 12.0 - 15.0 g/dL 7.7  7.7 Repeated and verified X2.  7.8   Hematocrit 36.0 - 46.0 % 25.3  24.7 aL  26.4   Platelets 150 - 400 K/uL 158  141.0          Latest Ref Rng & Units 12/17/2022    2:05 PM 12/09/2022   12:48 PM 11/26/2022    1:19 AM  CMP  Glucose 70 - 99 mg/dL 95  782  956   BUN 8 - 23 mg/dL 39  37  31   Creatinine 0.44 - 1.00 mg/dL 2.13  0.86  5.78   Sodium 135 - 145 mmol/L 140  140  136   Potassium 3.5 - 5.1 mmol/L 4.7  4.4  4.4   Chloride 98 - 111 mmol/L 99  99  101   CO2 22 - 32 mmol/L 34  35  28   Calcium 8.9 - 10.3 mg/dL 9.2  9.3  8.4   Total Protein 6.5 - 8.1 g/dL 7.1   5.6   Total Bilirubin 0.3 - 1.2 mg/dL 0.5   0.7   Alkaline Phos 38 - 126 U/L 42   40   AST 15 - 41 U/L 15   22   ALT 0 - 44 U/L 7   15       RADIOGRAPHIC STUDIES: I have personally reviewed the radiological images as listed and agreed with the findings in the report. No results found.    No orders of the defined types were placed in this encounter.  All questions were answered. The patient knows to call the clinic with any problems, questions or concerns. No barriers to  learning was detected. The total time spent in the appointment was 40 minutes.     Malachy Mood, MD 12/17/2022   Carolin Coy, CMA, am acting as scribe for Malachy Mood, MD.   I have reviewed the above documentation for accuracy and completeness, and I agree with the above.

## 2022-12-17 NOTE — Assessment & Plan Note (Signed)
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   -Outpatient EUS 03/04/2022 by Dr. 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 Freida Busman did not recommend surgery in multifocal NET and also due to her age and co-morbidities. -She began first line sandostatin injections q28 days on 03/27/22.  -Dotatate PET scan, done 2 weeks after first injection 12/6, shows no significant tracer avidity in the pancrease or distant sites. Findings are likely due to close timing relative to the sandostatin injection blocking receptors. When we repeat this for restaging next year, will arrange 4 weeks after sandostatin -She is clinically doing well, tolerating Sandostatin injection well.  Will continue.

## 2022-12-17 NOTE — Assessment & Plan Note (Addendum)
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 -She previously had reaction to blood transfusion, or required multiple premedications for blood transfusion -She was recently hospitalized for anemia and a GI bleeding in July 2024.

## 2022-12-18 ENCOUNTER — Telehealth: Payer: Self-pay

## 2022-12-18 NOTE — Telephone Encounter (Signed)
Spoke w/pt via telephone to inform pt that Dr. Mosetta Putt has spoke with both the pt's GI doctors regarding pt having blood transfusions w/in the [facility].  Stated that both providers are in agreement w/the pt having blood transfusions done here at the [facility].  Pt verbalized understanding and had no further questions or concerns.

## 2022-12-20 ENCOUNTER — Telehealth: Payer: Self-pay

## 2022-12-20 NOTE — Telephone Encounter (Signed)
Called patient. Appointment is scheduled with the patient on 01/21/23 at 10:00 am with Dr. Barron Alvine. There are no sooner appointments available in 2 weeks with or further with Apps either.

## 2022-12-20 NOTE — Telephone Encounter (Signed)
-----   Message from Shellia Cleverly sent at 12/17/2022  4:30 PM EDT ----- Can you look for an OV with me or one of the APP's for this patient sometime over the next 2-3 weeks if possible for hospital follow-up. ----- Message ----- From: Lemar Lofty., MD Sent: 12/17/2022   3:48 PM EDT To: Malachy Mood, MD; Doristine Locks V, DO  YF, I don't think I have ever said that. I am OK with transfusion in any place. The bigger question is that if she is still having significant anemia she may need some additional workup as outlined in my procedure note of a colonoscopy +/- video capsule endoscopy. I am putting Dr. Barron Alvine on here as he is her primary. Thanks. GM ----- Message ----- From: Malachy Mood, MD Sent: 12/17/2022   3:12 PM EDT To: Lemar Lofty., MD  Liz Beach,  I am seeing her now, her daughter tells me that you want her to get blood transfusion at [facility] ED, not in our office due to her previous blood transfusion. Is this correct? I am not sure if ED nurse will do better job monitoring her than our nurses in our infusion room. Just want to check with you if there is any other reason you want her to go to ED.   Thanks   Terrace Arabia

## 2022-12-23 ENCOUNTER — Telehealth: Payer: Self-pay | Admitting: Hematology

## 2022-12-27 DIAGNOSIS — I5033 Acute on chronic diastolic (congestive) heart failure: Secondary | ICD-10-CM | POA: Diagnosis not present

## 2022-12-27 DIAGNOSIS — J449 Chronic obstructive pulmonary disease, unspecified: Secondary | ICD-10-CM | POA: Diagnosis not present

## 2022-12-30 ENCOUNTER — Other Ambulatory Visit: Payer: Self-pay

## 2022-12-30 ENCOUNTER — Inpatient Hospital Stay: Payer: PPO

## 2022-12-30 ENCOUNTER — Other Ambulatory Visit: Payer: Self-pay | Admitting: Nurse Practitioner

## 2022-12-30 ENCOUNTER — Telehealth: Payer: Self-pay

## 2022-12-30 VITALS — BP 142/54 | HR 59 | Temp 98.6°F | Resp 16

## 2022-12-30 DIAGNOSIS — N1832 Chronic kidney disease, stage 3b: Secondary | ICD-10-CM | POA: Diagnosis not present

## 2022-12-30 DIAGNOSIS — D649 Anemia, unspecified: Secondary | ICD-10-CM

## 2022-12-30 DIAGNOSIS — D3A8 Other benign neuroendocrine tumors: Secondary | ICD-10-CM

## 2022-12-30 LAB — CBC WITH DIFFERENTIAL (CANCER CENTER ONLY)
Abs Immature Granulocytes: 0.01 10*3/uL (ref 0.00–0.07)
Basophils Absolute: 0 10*3/uL (ref 0.0–0.1)
Basophils Relative: 1 %
Eosinophils Absolute: 0 10*3/uL (ref 0.0–0.5)
Eosinophils Relative: 0 %
HCT: 24.5 % — ABNORMAL LOW (ref 36.0–46.0)
Hemoglobin: 7.2 g/dL — ABNORMAL LOW (ref 12.0–15.0)
Immature Granulocytes: 0 %
Lymphocytes Relative: 14 %
Lymphs Abs: 0.4 10*3/uL — ABNORMAL LOW (ref 0.7–4.0)
MCH: 27.7 pg (ref 26.0–34.0)
MCHC: 29.4 g/dL — ABNORMAL LOW (ref 30.0–36.0)
MCV: 94.2 fL (ref 80.0–100.0)
Monocytes Absolute: 0.4 10*3/uL (ref 0.1–1.0)
Monocytes Relative: 12 %
Neutro Abs: 2.1 10*3/uL (ref 1.7–7.7)
Neutrophils Relative %: 73 %
Platelet Count: 158 10*3/uL (ref 150–400)
RBC: 2.6 MIL/uL — ABNORMAL LOW (ref 3.87–5.11)
RDW: 17.4 % — ABNORMAL HIGH (ref 11.5–15.5)
WBC Count: 2.9 10*3/uL — ABNORMAL LOW (ref 4.0–10.5)
nRBC: 0 % (ref 0.0–0.2)

## 2022-12-30 LAB — CMP (CANCER CENTER ONLY)
ALT: 6 U/L (ref 0–44)
AST: 14 U/L — ABNORMAL LOW (ref 15–41)
Albumin: 4.1 g/dL (ref 3.5–5.0)
Alkaline Phosphatase: 38 U/L (ref 38–126)
Anion gap: 7 (ref 5–15)
BUN: 48 mg/dL — ABNORMAL HIGH (ref 8–23)
CO2: 32 mmol/L (ref 22–32)
Calcium: 9.1 mg/dL (ref 8.9–10.3)
Chloride: 100 mmol/L (ref 98–111)
Creatinine: 1.6 mg/dL — ABNORMAL HIGH (ref 0.44–1.00)
GFR, Estimated: 31 mL/min — ABNORMAL LOW (ref >60)
Glucose, Bld: 109 mg/dL — ABNORMAL HIGH (ref 70–99)
Potassium: 4.5 mmol/L (ref 3.5–5.1)
Sodium: 139 mmol/L (ref 135–145)
Total Bilirubin: 0.5 mg/dL (ref 0.3–1.2)
Total Protein: 7.1 g/dL (ref 6.5–8.1)

## 2022-12-30 MED ORDER — EPOETIN ALFA-EPBX 20000 UNIT/ML IJ SOLN
20000.0000 [IU] | Freq: Once | INTRAMUSCULAR | Status: AC
  Administered 2022-12-30: 20000 [IU] via SUBCUTANEOUS
  Filled 2022-12-30: qty 1

## 2022-12-30 NOTE — Telephone Encounter (Signed)
Attempted to call pt x2 attempts to schedule a lab appointment, no answer, unable to LVM d/t mailbox full

## 2022-12-31 ENCOUNTER — Other Ambulatory Visit: Payer: PPO

## 2022-12-31 ENCOUNTER — Other Ambulatory Visit: Payer: Self-pay

## 2022-12-31 ENCOUNTER — Inpatient Hospital Stay: Payer: PPO

## 2022-12-31 ENCOUNTER — Ambulatory Visit: Payer: PPO

## 2022-12-31 DIAGNOSIS — N1832 Chronic kidney disease, stage 3b: Secondary | ICD-10-CM | POA: Diagnosis not present

## 2022-12-31 DIAGNOSIS — D3A8 Other benign neuroendocrine tumors: Secondary | ICD-10-CM

## 2022-12-31 DIAGNOSIS — D649 Anemia, unspecified: Secondary | ICD-10-CM

## 2022-12-31 LAB — SAMPLE TO BLOOD BANK

## 2022-12-31 LAB — PREPARE RBC (CROSSMATCH)

## 2022-12-31 NOTE — Progress Notes (Signed)
Pt needs to be rescheduled

## 2022-12-31 NOTE — Progress Notes (Signed)
Blood orders re-entered

## 2023-01-01 ENCOUNTER — Inpatient Hospital Stay: Payer: PPO

## 2023-01-01 VITALS — BP 149/49 | HR 72 | Temp 97.9°F | Resp 18

## 2023-01-01 DIAGNOSIS — N1832 Chronic kidney disease, stage 3b: Secondary | ICD-10-CM | POA: Diagnosis not present

## 2023-01-01 DIAGNOSIS — D649 Anemia, unspecified: Secondary | ICD-10-CM

## 2023-01-01 LAB — BPAM RBC
Blood Product Expiration Date: 202410012359
Unit Type and Rh: 600

## 2023-01-01 LAB — TYPE AND SCREEN
ABO/RH(D): A NEG
Antibody Screen: POSITIVE
Unit division: 0

## 2023-01-01 LAB — SAMPLE TO BLOOD BANK

## 2023-01-01 LAB — PREPARE RBC (CROSSMATCH)

## 2023-01-01 MED ORDER — METHYLPREDNISOLONE SODIUM SUCC 125 MG IJ SOLR
125.0000 mg | Freq: Once | INTRAMUSCULAR | Status: AC
  Administered 2023-01-01: 125 mg via INTRAVENOUS
  Filled 2023-01-01: qty 2

## 2023-01-01 MED ORDER — ONDANSETRON HCL 4 MG/2ML IJ SOLN
4.0000 mg | Freq: Once | INTRAMUSCULAR | Status: AC
  Administered 2023-01-01: 4 mg via INTRAVENOUS
  Filled 2023-01-01: qty 2

## 2023-01-01 MED ORDER — SODIUM CHLORIDE 0.9% IV SOLUTION
250.0000 mL | Freq: Once | INTRAVENOUS | Status: AC
  Administered 2023-01-01: 250 mL via INTRAVENOUS

## 2023-01-01 MED ORDER — FAMOTIDINE IN NACL 20-0.9 MG/50ML-% IV SOLN
20.0000 mg | Freq: Once | INTRAVENOUS | Status: AC
  Administered 2023-01-01: 20 mg via INTRAVENOUS
  Filled 2023-01-01: qty 50

## 2023-01-01 MED ORDER — DIPHENHYDRAMINE HCL 25 MG PO CAPS
50.0000 mg | ORAL_CAPSULE | Freq: Once | ORAL | Status: AC
  Administered 2023-01-01: 50 mg via ORAL
  Filled 2023-01-01: qty 2

## 2023-01-01 MED ORDER — ACETAMINOPHEN 325 MG PO TABS
650.0000 mg | ORAL_TABLET | Freq: Once | ORAL | Status: AC
  Administered 2023-01-01: 650 mg via ORAL
  Filled 2023-01-01: qty 2

## 2023-01-01 MED ORDER — FAMOTIDINE IN NACL 20-0.9 MG/50ML-% IV SOLN
20.0000 mg | Freq: Once | INTRAVENOUS | Status: AC
  Administered 2023-01-01: 20 mg via INTRAVENOUS

## 2023-01-01 NOTE — Patient Instructions (Signed)
Blood Transfusion, Adult, Care After After a blood transfusion, it is common to have: Bruising and soreness at the IV site. A headache. Follow these instructions at home: Your doctor may give you more instructions. If you have problems, contact your doctor. Insertion site care     Follow instructions from your doctor about how to take care of your insertion site. This is where an IV tube was put into your vein. Make sure you: Wash your hands with soap and water for at least 20 seconds before and after you change your bandage. If you cannot use soap and water, use hand sanitizer. Change your bandage as told by your doctor. Check your insertion site every day for signs of infection. Check for: Redness, swelling, or pain. Bleeding from the site. Warmth. Pus or a bad smell. General instructions Take over-the-counter and prescription medicines only as told by your doctor. Rest as told by your doctor. Go back to your normal activities as told by your doctor. Keep all follow-up visits. You may need to have tests at certain times to check your blood. Contact a doctor if: You have itching or red, swollen areas of skin (hives). You have a fever or chills. You have pain in the head, back, or chest. You feel worried or nervous (anxious). You feel weak after doing your normal activities. You have any of these problems at the insertion site: Redness, swelling, warmth, or pain. Bleeding that does not stop with pressure. Pus or a bad smell. If you received your blood transfusion in an outpatient setting, you will be told whom to contact to report any reactions. Get help right away if: You have signs of a serious reaction. This may be coming from an allergy or the body's defense system (immune system). Signs include: Trouble breathing or shortness of breath. Swelling of the face or feeling warm (flushed). A widespread rash. Dark pee (urine) or blood in the pee. Fast heartbeat. These symptoms  may be an emergency. Get help right away. Call 911. Do not wait to see if the symptoms will go away. Do not drive yourself to the hospital. Summary Bruising and soreness at the IV site are common. Check your insertion site every day for signs of infection. Rest as told by your doctor. Go back to your normal activities as told by your doctor. Get help right away if you have signs of a serious reaction. This information is not intended to replace advice given to you by your health care provider. Make sure you discuss any questions you have with your health care provider. Document Revised: 07/20/2021 Document Reviewed: 07/20/2021 Elsevier Patient Education  2024 [employer]  

## 2023-01-01 NOTE — Progress Notes (Signed)
Patient tolerated blood transfusion up until fast flush. During post transfusion vital signs, patient began to complain of nausea and chills. Vitals stable and no fever noted. Patient had one episode of emesis. She denied chest pain, new SHOB, and new back pain- only symptoms were nausea and chills. [name], PA-C notified and came to patient's chairside. Patient given 4mg  IV zofran and additional 20mg  IV pepcid per verbal order [name], PA-C. Within 5 minutes of medication administration, patient reported feeling much better and that her symptoms resolved. [name], PA-C, notified Dr. Mosetta Putt who states patient is safe for discharge once she returns to baseline. Patient was observed for 30 minutes and was at her baseline upon leaving [facility]. Vitals stable and patient in no distress.

## 2023-01-02 LAB — BPAM RBC
Blood Product Expiration Date: 202410012359
ISSUE DATE / TIME: 202408281318
Unit Type and Rh: 600

## 2023-01-02 LAB — TYPE AND SCREEN
ABO/RH(D): A NEG
Antibody Screen: POSITIVE
Donor AG Type: NEGATIVE
Unit division: 0

## 2023-01-07 ENCOUNTER — Other Ambulatory Visit: Payer: Self-pay | Admitting: Family Medicine

## 2023-01-07 DIAGNOSIS — J849 Interstitial pulmonary disease, unspecified: Secondary | ICD-10-CM

## 2023-01-07 DIAGNOSIS — Z9981 Dependence on supplemental oxygen: Secondary | ICD-10-CM

## 2023-01-07 DIAGNOSIS — J439 Emphysema, unspecified: Secondary | ICD-10-CM

## 2023-01-13 ENCOUNTER — Telehealth (HOSPITAL_COMMUNITY): Payer: Self-pay | Admitting: Cardiology

## 2023-01-13 ENCOUNTER — Other Ambulatory Visit: Payer: Self-pay

## 2023-01-13 ENCOUNTER — Encounter (HOSPITAL_COMMUNITY): Payer: PPO

## 2023-01-13 NOTE — Progress Notes (Signed)
Patient called in wanting to know if she should come in for her injection appointment. She stated she is having shortness of breath, she is retaining fluid in her lower legs and feet, the fatigue is starting to effect her daily activity. She stated these symptoms have been going on since she received blood on 08/28. Made Dr. Mosetta Putt aware and she told me to add the patient on to her schedule for 09/10 at 2pm and have her labs moved to 130pm. Made patient aware she stated she would be here at that time.

## 2023-01-13 NOTE — Telephone Encounter (Signed)
Patient called to report increased SOB (very winded during call) lethargic BLE-swelling up to knees  Eliquis d/c secondary to  bleeds (ok'd by mclean) CardioMems reading 25 goal 17   Pt reports increase in SOB started shortly after blood transfusion last week   Pt wants to know if she can increase home o2 -unable to find pulse ox  9/9 appt cancelled (transportation)

## 2023-01-13 NOTE — Telephone Encounter (Signed)
Pt aware and voiced understanding 

## 2023-01-14 ENCOUNTER — Ambulatory Visit: Payer: PPO | Admitting: Gastroenterology

## 2023-01-14 ENCOUNTER — Inpatient Hospital Stay: Payer: PPO

## 2023-01-14 ENCOUNTER — Inpatient Hospital Stay (HOSPITAL_BASED_OUTPATIENT_CLINIC_OR_DEPARTMENT_OTHER): Payer: PPO | Admitting: Hematology

## 2023-01-14 ENCOUNTER — Inpatient Hospital Stay: Payer: PPO | Attending: Nurse Practitioner

## 2023-01-14 ENCOUNTER — Other Ambulatory Visit: Payer: Self-pay | Admitting: Hematology

## 2023-01-14 VITALS — BP 135/54 | HR 60 | Temp 97.6°F | Resp 19 | Ht 66.0 in | Wt 141.5 lb

## 2023-01-14 DIAGNOSIS — I878 Other specified disorders of veins: Secondary | ICD-10-CM | POA: Diagnosis present

## 2023-01-14 DIAGNOSIS — J9611 Chronic respiratory failure with hypoxia: Secondary | ICD-10-CM | POA: Diagnosis present

## 2023-01-14 DIAGNOSIS — Z87891 Personal history of nicotine dependence: Secondary | ICD-10-CM

## 2023-01-14 DIAGNOSIS — Z79899 Other long term (current) drug therapy: Secondary | ICD-10-CM

## 2023-01-14 DIAGNOSIS — R0600 Dyspnea, unspecified: Secondary | ICD-10-CM

## 2023-01-14 DIAGNOSIS — J9811 Atelectasis: Secondary | ICD-10-CM | POA: Diagnosis present

## 2023-01-14 DIAGNOSIS — D649 Anemia, unspecified: Secondary | ICD-10-CM | POA: Diagnosis not present

## 2023-01-14 DIAGNOSIS — Z961 Presence of intraocular lens: Secondary | ICD-10-CM | POA: Diagnosis present

## 2023-01-14 DIAGNOSIS — Z7901 Long term (current) use of anticoagulants: Secondary | ICD-10-CM

## 2023-01-14 DIAGNOSIS — D3A8 Other benign neuroendocrine tumors: Secondary | ICD-10-CM | POA: Diagnosis present

## 2023-01-14 DIAGNOSIS — Z8673 Personal history of transient ischemic attack (TIA), and cerebral infarction without residual deficits: Secondary | ICD-10-CM

## 2023-01-14 DIAGNOSIS — I739 Peripheral vascular disease, unspecified: Secondary | ICD-10-CM | POA: Diagnosis present

## 2023-01-14 DIAGNOSIS — Z85828 Personal history of other malignant neoplasm of skin: Secondary | ICD-10-CM

## 2023-01-14 DIAGNOSIS — J449 Chronic obstructive pulmonary disease, unspecified: Secondary | ICD-10-CM | POA: Diagnosis present

## 2023-01-14 DIAGNOSIS — K7460 Unspecified cirrhosis of liver: Secondary | ICD-10-CM | POA: Diagnosis present

## 2023-01-14 DIAGNOSIS — I272 Pulmonary hypertension, unspecified: Secondary | ICD-10-CM | POA: Diagnosis present

## 2023-01-14 DIAGNOSIS — J849 Interstitial pulmonary disease, unspecified: Secondary | ICD-10-CM | POA: Diagnosis present

## 2023-01-14 DIAGNOSIS — E7800 Pure hypercholesterolemia, unspecified: Secondary | ICD-10-CM | POA: Diagnosis present

## 2023-01-14 DIAGNOSIS — I4821 Permanent atrial fibrillation: Secondary | ICD-10-CM | POA: Diagnosis present

## 2023-01-14 DIAGNOSIS — Z9841 Cataract extraction status, right eye: Secondary | ICD-10-CM

## 2023-01-14 DIAGNOSIS — K5521 Angiodysplasia of colon with hemorrhage: Secondary | ICD-10-CM | POA: Diagnosis present

## 2023-01-14 DIAGNOSIS — Z96643 Presence of artificial hip joint, bilateral: Secondary | ICD-10-CM | POA: Diagnosis present

## 2023-01-14 DIAGNOSIS — R5381 Other malaise: Secondary | ICD-10-CM | POA: Diagnosis present

## 2023-01-14 DIAGNOSIS — I6523 Occlusion and stenosis of bilateral carotid arteries: Secondary | ICD-10-CM | POA: Diagnosis present

## 2023-01-14 DIAGNOSIS — Z8249 Family history of ischemic heart disease and other diseases of the circulatory system: Secondary | ICD-10-CM

## 2023-01-14 DIAGNOSIS — Z8616 Personal history of COVID-19: Secondary | ICD-10-CM

## 2023-01-14 DIAGNOSIS — Z85528 Personal history of other malignant neoplasm of kidney: Secondary | ICD-10-CM

## 2023-01-14 DIAGNOSIS — N1832 Chronic kidney disease, stage 3b: Secondary | ICD-10-CM | POA: Diagnosis present

## 2023-01-14 DIAGNOSIS — I130 Hypertensive heart and chronic kidney disease with heart failure and stage 1 through stage 4 chronic kidney disease, or unspecified chronic kidney disease: Secondary | ICD-10-CM | POA: Diagnosis not present

## 2023-01-14 DIAGNOSIS — I5033 Acute on chronic diastolic (congestive) heart failure: Secondary | ICD-10-CM | POA: Diagnosis present

## 2023-01-14 DIAGNOSIS — R768 Other specified abnormal immunological findings in serum: Secondary | ICD-10-CM | POA: Diagnosis present

## 2023-01-14 DIAGNOSIS — R531 Weakness: Secondary | ICD-10-CM | POA: Diagnosis not present

## 2023-01-14 DIAGNOSIS — Z9842 Cataract extraction status, left eye: Secondary | ICD-10-CM

## 2023-01-14 DIAGNOSIS — Z90710 Acquired absence of both cervix and uterus: Secondary | ICD-10-CM

## 2023-01-14 DIAGNOSIS — Z66 Do not resuscitate: Secondary | ICD-10-CM | POA: Diagnosis present

## 2023-01-14 DIAGNOSIS — I071 Rheumatic tricuspid insufficiency: Secondary | ICD-10-CM | POA: Diagnosis present

## 2023-01-14 DIAGNOSIS — M19042 Primary osteoarthritis, left hand: Secondary | ICD-10-CM | POA: Diagnosis present

## 2023-01-14 DIAGNOSIS — Z8507 Personal history of malignant neoplasm of pancreas: Secondary | ICD-10-CM

## 2023-01-14 DIAGNOSIS — I2510 Atherosclerotic heart disease of native coronary artery without angina pectoris: Secondary | ICD-10-CM | POA: Diagnosis present

## 2023-01-14 DIAGNOSIS — M19041 Primary osteoarthritis, right hand: Secondary | ICD-10-CM | POA: Diagnosis present

## 2023-01-14 DIAGNOSIS — Z95810 Presence of automatic (implantable) cardiac defibrillator: Secondary | ICD-10-CM

## 2023-01-14 DIAGNOSIS — I21A1 Myocardial infarction type 2: Secondary | ICD-10-CM | POA: Diagnosis present

## 2023-01-14 DIAGNOSIS — Z905 Acquired absence of kidney: Secondary | ICD-10-CM

## 2023-01-14 DIAGNOSIS — Z880 Allergy status to penicillin: Secondary | ICD-10-CM

## 2023-01-14 DIAGNOSIS — R739 Hyperglycemia, unspecified: Secondary | ICD-10-CM | POA: Diagnosis present

## 2023-01-14 DIAGNOSIS — D509 Iron deficiency anemia, unspecified: Secondary | ICD-10-CM | POA: Diagnosis present

## 2023-01-14 DIAGNOSIS — R0602 Shortness of breath: Secondary | ICD-10-CM | POA: Diagnosis not present

## 2023-01-14 DIAGNOSIS — Z885 Allergy status to narcotic agent status: Secondary | ICD-10-CM

## 2023-01-14 DIAGNOSIS — Z9049 Acquired absence of other specified parts of digestive tract: Secondary | ICD-10-CM

## 2023-01-14 DIAGNOSIS — I350 Nonrheumatic aortic (valve) stenosis: Secondary | ICD-10-CM | POA: Diagnosis present

## 2023-01-14 LAB — CBC WITH DIFFERENTIAL (CANCER CENTER ONLY)
Abs Immature Granulocytes: 0.02 10*3/uL (ref 0.00–0.07)
Basophils Absolute: 0 10*3/uL (ref 0.0–0.1)
Basophils Relative: 0 %
Eosinophils Absolute: 0 10*3/uL (ref 0.0–0.5)
Eosinophils Relative: 0 %
HCT: 23.9 % — ABNORMAL LOW (ref 36.0–46.0)
Hemoglobin: 7 g/dL — ABNORMAL LOW (ref 12.0–15.0)
Immature Granulocytes: 1 %
Lymphocytes Relative: 8 %
Lymphs Abs: 0.3 10*3/uL — ABNORMAL LOW (ref 0.7–4.0)
MCH: 27.1 pg (ref 26.0–34.0)
MCHC: 29.3 g/dL — ABNORMAL LOW (ref 30.0–36.0)
MCV: 92.6 fL (ref 80.0–100.0)
Monocytes Absolute: 0.4 10*3/uL (ref 0.1–1.0)
Monocytes Relative: 10 %
Neutro Abs: 3.3 10*3/uL (ref 1.7–7.7)
Neutrophils Relative %: 81 %
Platelet Count: 182 10*3/uL (ref 150–400)
RBC: 2.58 MIL/uL — ABNORMAL LOW (ref 3.87–5.11)
RDW: 17.8 % — ABNORMAL HIGH (ref 11.5–15.5)
WBC Count: 4.1 10*3/uL (ref 4.0–10.5)
nRBC: 0 % (ref 0.0–0.2)

## 2023-01-14 LAB — CMP (CANCER CENTER ONLY)
ALT: 7 U/L (ref 0–44)
AST: 12 U/L — ABNORMAL LOW (ref 15–41)
Albumin: 4.1 g/dL (ref 3.5–5.0)
Alkaline Phosphatase: 40 U/L (ref 38–126)
Anion gap: 5 (ref 5–15)
BUN: 46 mg/dL — ABNORMAL HIGH (ref 8–23)
CO2: 32 mmol/L (ref 22–32)
Calcium: 9.1 mg/dL (ref 8.9–10.3)
Chloride: 102 mmol/L (ref 98–111)
Creatinine: 1.83 mg/dL — ABNORMAL HIGH (ref 0.44–1.00)
GFR, Estimated: 27 mL/min — ABNORMAL LOW (ref >60)
Glucose, Bld: 75 mg/dL (ref 70–99)
Potassium: 4.9 mmol/L (ref 3.5–5.1)
Sodium: 139 mmol/L (ref 135–145)
Total Bilirubin: 0.6 mg/dL (ref 0.3–1.2)
Total Protein: 7.2 g/dL (ref 6.5–8.1)

## 2023-01-14 LAB — BRAIN NATRIURETIC PEPTIDE: B Natriuretic Peptide: 388 pg/mL — ABNORMAL HIGH (ref 0.0–100.0)

## 2023-01-14 MED ORDER — OCTREOTIDE ACETATE 20 MG IM KIT
20.0000 mg | PACK | Freq: Once | INTRAMUSCULAR | Status: AC
  Administered 2023-01-14: 20 mg via INTRAMUSCULAR
  Filled 2023-01-14: qty 1

## 2023-01-14 MED ORDER — EPOETIN ALFA-EPBX 20000 UNIT/ML IJ SOLN
20000.0000 [IU] | Freq: Once | INTRAMUSCULAR | Status: AC
  Administered 2023-01-14: 20000 [IU] via SUBCUTANEOUS
  Filled 2023-01-14: qty 1

## 2023-01-14 MED ORDER — METOLAZONE 2.5 MG PO TABS
2.5000 mg | ORAL_TABLET | Freq: Every day | ORAL | 0 refills | Status: DC
Start: 2023-01-14 — End: 2023-01-17

## 2023-01-14 NOTE — Assessment & Plan Note (Addendum)
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   -Outpatient EUS 03/04/2022 by Dr. 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 Freida Busman did not recommend surgery in multifocal NET and also due to her age and co-morbidities. -She began first line sandostatin injections q28 days on 03/27/22.  -Dotatate PET scan, done 2 weeks after first injection 12/6, shows no significant tracer avidity in the pancrease or distant sites. Findings are likely due to close timing relative to the sandostatin injection blocking receptors. When we repeat this for restaging next year, will arrange 4 weeks after sandostatin -She has persistent anemia, likely secondary to GI bleeding from the tumor.  She received 1 unit of blood transfusion 2 weeks ago, but unfortunately developed worsening leg edema, fatigue and dyspnea.  -Lab reviewed, hemoglobin 7.0.  Due to his significant anemia and CHF symptoms, I recommend ED evaluation and treatment, likely need hospital admission.  She initially was very reluctant to go to ED, after lengthy discussion, she agrees to go, but wants to go home first, then come back to ED tonight or tomorrow.  I spoke with her daughter and granddaughter on the phone, she came in with her niece today.  Family is aware of her decision.

## 2023-01-14 NOTE — Addendum Note (Signed)
Addended by: Karie Soda on: 01/14/2023 03:24 PM   Modules accepted: Orders

## 2023-01-14 NOTE — Progress Notes (Signed)
[facility]   Telephone:([phone] Fax:([phone]   Clinic Follow up Note   Patient Care Team: [name], MD as PCP - General (Family Medicine) Duke Salvia, MD as PCP - Cardiology (Cardiology) Laurey Morale, MD as PCP - Advanced Heart Failure (Cardiology) Antonieta Iba, MD as Consulting Physician (Cardiology) Duke Salvia, MD as Consulting Physician (Cardiology) [name], Latina Craver, MD as Consulting Physician (Vascular Surgery) Creig Hines, MD as Consulting Physician (Hematology and Oncology) Malachy Mood, MD as Consulting Physician (Oncology) Kathyrn Sheriff, [facility] (Inactive) as Pharmacist (Pharmacist)  Date of Service:  01/14/2023  CHIEF COMPLAINT: f/u of NET of the pancreas and anemia     CURRENT THERAPY:   First line Sandostatin injection q28 days, starting 03/27/22   blood transfusion: 2u if Hg<7.0, 1u as needed if hg 7.1-8.0 depends on her symptoms  Premedications for blood transfusion: Benadryl 50 mg, Pepcid 20 mg, insulin management 125 mg Restart Retacrit every 2 weeks on December 19, 2022 Iv feraheme if ferritin<100   ASSESSMENT:  [name] is a 89 y.o. 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   -Outpatient EUS 03/04/2022 by Dr. 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 Freida Busman did not recommend surgery in multifocal NET and also due to her age and co-morbidities. -She began first line sandostatin injections q28 days on 03/27/22.   -Dotatate PET scan, done 2 weeks after first injection 12/6, shows no significant tracer avidity in the pancrease or distant sites. Findings are likely due to close timing relative to the sandostatin injection blocking receptors. When we repeat this for restaging next year, will arrange 4 weeks after sandostatin -She has persistent anemia, likely secondary to GI bleeding from the tumor.  She received 1 unit of blood transfusion 2 weeks ago, but unfortunately developed worsening leg edema, fatigue and dyspnea.  -Lab reviewed, hemoglobin 7.0.  Due to his significant anemia and CHF symptoms, I recommend ED evaluation and treatment, likely need hospital admission.  She initially was very reluctant to go to ED, after lengthy discussion, she agrees to go, but wants to go home first, then come back to ED tonight or tomorrow.  I spoke with her daughter and granddaughter on the phone, she came in with her niece today.  Family is aware of her decision.  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 -She previously had reaction to blood transfusion, or required multiple premedications for blood transfusion -She was recently hospitalized for anemia and a GI bleeding in July 2024. -She is currently on ESA every 2 weeks, IV iron as needed, and depression infusion as needed.    PLAN: -lab reviewed -hg 7.0 -CMP -reviewed -Due to her significant anemia, and CHF symptoms,  I recommend ED evaluation and likely she will be admitted for blood transfusion and CHF management. -Patient declined ED evaluation after my visit, wants to go home after injections, but agrees to return to ED tonight or tomorrow. -proceed with  Sandostatin and Retacrit injection today -lab/flush and f/u in 01/27/2023.    INTERVAL HISTORY:  [name] is here for a follow up of NET of the pancreas and anemia    She was last seen by me on 12/17/2022 She presents to the clinic accompanied by niece, and daughter on speaker phone. Pt state that she has lower extremity edema. Pt state that she has SOB. Pt state that after receiving  the 1 unit of blood, she doesn't feel a difference.Pt states that after receiving the infusion she has some vomiting and chills. Pt started feeling bad for the past two weeks.      All other systems were reviewed with the patient and are negative.  MEDICAL HISTORY:  Past Medical History:  Diagnosis Date   (HFpEF) heart failure with preserved ejection fraction (HCC)    EF=60-60%   Actinic keratosis 01/17/2015   R forearm   Adjustment disorder with anxiety    Adverse effect of other narcotics, sequela    Intolerance to all narcotics   Anti-Duffy antibodies present    Aortic valve stenosis    Arthritis    "some in my hands" (11/11/2012)   Atrial fibrillation, permanent (HCC)    Eliquis   Atypical mole 03/25/2018   L forearm - severe   Automatic implantable cardioverter-defibrillator in situ    Avascular necrosis of hip (HCC) 05/03/2011   Carotid artery stenosis 09/2007   a. 09/2007: 60-79% bilateral (stable); b. 10/2008: 40-59% R 60-79%    Cellulitis of left lower extremity 07/13/2020   CKD (chronic kidney disease), stage III (HCC)    COPD (chronic obstructive pulmonary disease) (HCC)    Coronary artery disease    non-obstructive by 2006 cath   COVID-19 virus infection 02/12/2022   Displaced fracture of left femoral neck (HCC) 10/09/2018   GI bleed 03/28/2020   AVM   High cholesterol    Hypertension 05/20/2011   Hypertr obst cardiomyop    Hypotension, unspecified    cardiac cath 2006..nonobstructive CAD 30-40s lesions.Marland KitchenETT 1/09 nondiagnostic due to poor HR response..Right Renal Cancer  2003   Iron deficiency anemia    Long term (current) use of anticoagulants    On home oxygen therapy    3L [place]   Osteoarthritis of right hip    PAD (peripheral artery disease) (HCC)    Pancreatic cancer (HCC)    sandostatin   PONV (postoperative nausea and vomiting)    Presence of permanent cardiac pacemaker    Pulmonary HTN (HCC)    RECTAL BLEEDING 10/13/2009   Qualifier: Diagnosis of  By: Wilmon Pali NP, Paula     Red blood cell antibody positive with compatible PRBC difficult to obtain    Anti FYA (Duffy a) antibody. Must be transfused with PRB which are Duffy Antigen Negative and Crossmatch Compatible   Renal cell carcinoma (HCC)    s/p nephrectomy   Squamous cell carcinoma of skin 01/17/2015   R lat wrist   Squamous cell carcinoma of skin 01/29/2018   R post upper leg - superficially invasive   Squamous cell carcinoma of skin 03/25/2018   L lat foot   Squamous cell carcinoma of skin 11/09/2020   left lat foot - EDC 01/01/21, recurrent 01/31/21 - MOHs 02/22/21  Squamous cell carcinoma of skin 12/19/2021   Right Posterior Medial Thigh, EDC   Squamous cell carcinoma of skin 12/19/2021   SCC IS, L lat heel, EDC 02/04/2022   Squamous cell carcinoma of skin 01/21/2022   L forearm, EDC 02/04/2022   Squamous cell carcinoma of skin 01/21/2022   R lower leg below knee, EDC 02/04/2022   Squamous cell carcinoma of skin 01/21/2022   SCCIS, R post heel, EDC 02/04/2022   Squamous cell carcinoma of skin 07/01/2022   left lower abdomen, in situ, EDC   Squamous cell carcinoma of skin 07/01/2022   left medial chest, EDC   Urge incontinence    Venous stasis of both lower extremities     SURGICAL HISTORY: Past Surgical History:  Procedure Laterality Date   ABDOMINAL AORTOGRAM W/LOWER EXTREMITY N/A 02/12/2021   Procedure: ABDOMINAL AORTOGRAM W/LOWER EXTREMITY;  Surgeon: Maeola Harman, MD;  Location: [facility] INVASIVE CV LAB;  Service: Cardiovascular;  Laterality: N/A;   ABDOMINAL  HYSTERECTOMY  1975   for benign causes   APPENDECTOMY     BI-VENTRICULAR PACEMAKER UPGRADE  05/04/2010   BIOPSY  02/28/2019   Procedure: BIOPSY;  Surgeon: Rachael Fee, MD;  Location: [facility] ENDOSCOPY;  Service: Endoscopy;;   BIOPSY  03/04/2022   Procedure: BIOPSY;  Surgeon: Lemar Lofty., MD;  Location: [facility] ENDOSCOPY;  Service: Gastroenterology;;   CARDIAC CATHETERIZATION  2006   CARDIOVERSION N/A 02/20/2018   Procedure: CARDIOVERSION;  Surgeon: Antonieta Iba, MD;  Location: [facility] ORS;  Service: Cardiovascular;  Laterality: N/A;   CARDIOVERSION N/A 03/27/2018   Procedure: CARDIOVERSION;  Surgeon: Antonieta Iba, MD;  Location: [facility] ORS;  Service: Cardiovascular;  Laterality: N/A;   CATARACT EXTRACTION W/ INTRAOCULAR LENS  IMPLANT, BILATERAL  01/2006-02-2006   CHOLECYSTECTOMY N/A 11/11/2012   Procedure: LAPAROSCOPIC CHOLECYSTECTOMY WITH INTRAOPERATIVE CHOLANGIOGRAM;  Surgeon: Wilmon Arms. Corliss Skains, MD;  Location: [facility] OR;  Service: General;  Laterality: N/A;   COLONOSCOPY WITH PROPOFOL N/A 02/28/2019   Procedure: COLONOSCOPY WITH PROPOFOL;  Surgeon: Rachael Fee, MD;  Location: [facility] ENDOSCOPY;  Service: Endoscopy;  Laterality: N/A;   ENTEROSCOPY N/A 03/30/2020   Procedure: ENTEROSCOPY;  Surgeon: Shellia Cleverly, DO;  Location: [facility] ENDOSCOPY;  Service: Gastroenterology;  Laterality: N/A;   ENTEROSCOPY N/A 02/10/2021   Procedure: ENTEROSCOPY;  Surgeon: Jeani Hawking, MD;  Location: [facility] ENDOSCOPY;  Service: Endoscopy;  Laterality: N/A;   ENTEROSCOPY N/A 02/15/2022   Procedure: ENTEROSCOPY;  Surgeon: Imogene Burn, MD;  Location: [facility] ENDOSCOPY;  Service: Gastroenterology;  Laterality: N/A;   ENTEROSCOPY N/A 11/06/2022   Procedure: ENTEROSCOPY;  Surgeon: Meridee Score Netty Starring., MD;  Location: [facility] ENDOSCOPY;  Service: Gastroenterology;  Laterality: N/A;   EP IMPLANTABLE DEVICE N/A 02/21/2016   Procedure: ICD Generator Changeout;  Surgeon: Duke Salvia, MD;  Location: [facility] INVASIVE CV LAB;   Service: Cardiovascular;  Laterality: N/A;   ESOPHAGOGASTRODUODENOSCOPY N/A 03/04/2022   Procedure: ESOPHAGOGASTRODUODENOSCOPY (EGD);  Surgeon: Lemar Lofty., MD;  Location: [facility] ENDOSCOPY;  Service: Gastroenterology;  Laterality: N/A;   ESOPHAGOGASTRODUODENOSCOPY (EGD) WITH PROPOFOL N/A 02/28/2019   Procedure: ESOPHAGOGASTRODUODENOSCOPY (EGD) WITH PROPOFOL;  Surgeon: Rachael Fee, MD;  Location: [facility] ENDOSCOPY;  Service: Endoscopy;  Laterality: N/A;   EUS N/A 03/04/2022   Procedure: UPPER ENDOSCOPIC ULTRASOUND (EUS) RADIAL;  Surgeon: Lemar Lofty., MD;  Location: [facility] ENDOSCOPY;  Service: Gastroenterology;  Laterality: N/A;   FINE NEEDLE ASPIRATION N/A 03/04/2022   Procedure: FINE NEEDLE ASPIRATION (FNA) LINEAR;  Surgeon: Lemar Lofty., MD;  Location:  [facility] ENDOSCOPY;  Service: Gastroenterology;  Laterality: N/A;   GIVENS CAPSULE STUDY N/A 03/15/2019   Procedure: GIVENS CAPSULE STUDY;  Surgeon: Tressia Danas, MD;  Location: [name] ENDOSCOPY;  Service: Gastroenterology;  Laterality: N/A;   HOT HEMOSTASIS N/A 03/30/2020   Procedure: HOT HEMOSTASIS (ARGON PLASMA COAGULATION/BICAP);  Surgeon: Shellia Cleverly, DO;  Location: [facility] ENDOSCOPY;  Service: Gastroenterology;  Laterality: N/A;   HOT HEMOSTASIS N/A 02/10/2021   Procedure: HOT HEMOSTASIS (ARGON PLASMA COAGULATION/BICAP);  Surgeon: Jeani Hawking, MD;  Location: [facility] ENDOSCOPY;  Service: Endoscopy;  Laterality: N/A;   HOT HEMOSTASIS N/A 02/15/2022   Procedure: HOT HEMOSTASIS (ARGON PLASMA COAGULATION/BICAP);  Surgeon: Imogene Burn, MD;  Location: [facility] ENDOSCOPY;  Service: Gastroenterology;  Laterality: N/A;   HOT HEMOSTASIS N/A 11/06/2022   Procedure: HOT HEMOSTASIS (ARGON PLASMA COAGULATION/BICAP);  Surgeon: Lemar Lofty., MD;  Location: [facility] ENDOSCOPY;  Service: Gastroenterology;  Laterality: N/A;   INSERT / REPLACE / REMOVE PACEMAKER  05-01-11   02-28-05-/05-04-10-ICD-MEDTRONIC MAXIMAL DR   IR ANGIOGRAM SELECTIVE  EACH ADDITIONAL VESSEL  02/18/2022   IR ANGIOGRAM SELECTIVE EACH ADDITIONAL VESSEL  02/18/2022   IR ANGIOGRAM VISCERAL SELECTIVE  02/16/2022   IR ANGIOGRAM VISCERAL SELECTIVE  02/16/2022   IR EMBO ART  VEN HEMORR LYMPH EXTRAV  INC GUIDE ROADMAPPING  02/16/2022   IR THORACENTESIS ASP PLEURAL SPACE W/IMG GUIDE  02/21/2022   IR THORACENTESIS ASP PLEURAL SPACE W/IMG GUIDE  02/22/2022   IR US GUIDE VASC ACCESS RIGHT  02/16/2022   JOINT REPLACEMENT     LAPAROSCOPIC CHOLECYSTECTOMY  11/11/2012   LAPAROSCOPIC LYSIS OF ADHESIONS N/A 11/11/2012   Procedure: LAPAROSCOPIC LYSIS OF ADHESIONS;  Surgeon: Wilmon Arms. Corliss Skains, MD;  Location: [facility] OR;  Service: General;  Laterality: N/A;   NEPHRECTOMY Right 06/2001    S/P RENAL CELL CANCER   PERIPHERAL VASCULAR INTERVENTION Bilateral 02/12/2021   Procedure: PERIPHERAL VASCULAR INTERVENTION;  Surgeon: Maeola Harman, MD;  Location: [facility] INVASIVE CV LAB;  Service: Cardiovascular;  Laterality: Bilateral;  Iliac artery stents   PRESSURE SENSOR/CARDIOMEMS N/A 02/03/2019   Procedure: PRESSURE SENSOR/CARDIOMEMS;  Surgeon: Laurey Morale, MD;  Location: [facility] INVASIVE CV LAB;  Service: Cardiovascular;  Laterality: N/A;   RIGHT HEART CATH N/A 11/09/2018   Procedure: RIGHT HEART CATH;  Surgeon: Laurey Morale, MD;  Location: [facility] INVASIVE CV LAB;  Service: Cardiovascular;  Laterality: N/A;   RIGHT HEART CATH N/A 03/08/2019   Procedure: RIGHT HEART CATH;  Surgeon: Laurey Morale, MD;  Location: [facility] INVASIVE CV LAB;  Service: Cardiovascular;  Laterality: N/A;   SUBMUCOSAL TATTOO INJECTION  02/28/2019   Procedure: SUBMUCOSAL TATTOO INJECTION;  Surgeon: Rachael Fee, MD;  Location: [facility] ENDOSCOPY;  Service: Endoscopy;;   SUBMUCOSAL TATTOO INJECTION  11/06/2022   Procedure: SUBMUCOSAL TATTOO INJECTION;  Surgeon: Lemar Lofty., MD;  Location: [facility] ENDOSCOPY;  Service: Gastroenterology;;   TOTAL HIP ARTHROPLASTY Right 05/03/2011   Procedure: TOTAL HIP ARTHROPLASTY  ANTERIOR APPROACH;  Surgeon: Kathryne Hitch;  Location: [facility] ORS;  Service: Orthopedics;  Laterality: Right;  Removal of Cannulated Screws Right Hip, Right Direct Anterior Hip Replacement   TOTAL HIP ARTHROPLASTY Left 10/09/2018   Procedure: TOTAL HIP ARTHROPLASTY ANTERIOR APPROACH;  Surgeon: Samson Frederic, MD;  Location: [facility] OR;  Service: Orthopedics;  Laterality: Left;    I have reviewed the social history and family history with the patient and they are unchanged from previous note.  ALLERGIES:  is allergic to codeine, morphine and codeine, and amoxicillin.  MEDICATIONS:  Current Outpatient  Medications  Medication Sig Dispense Refill   acetaminophen (TYLENOL) 500 MG tablet Take 1,000 mg by mouth every 8 (eight) hours as needed for mild pain or headache.     Cyanocobalamin (B-12 PO) Take 1 tablet by mouth daily.     doxycycline (VIBRA-TABS) 100 MG tablet Take 1 tablet (100 mg total) by mouth 2 (two) times daily. 14 tablet 0   ferrous gluconate (FERGON) 324 MG tablet Take 1 tablet (324 mg total) by mouth daily with breakfast. 30 tablet 3   fluticasone (FLONASE) 50 MCG/ACT nasal spray Place 2 sprays into both nostrils daily as needed for allergies or rhinitis. 16 g 0   metolazone (ZAROXOLYN) 2.5 MG tablet Take 1 tablet (2.5 mg total) by mouth daily for 1 day. Take additional Potassium 20 mEq 2 tablets (40 mEq total) with the Metolazone dose. 1 tablet 0   pantoprazole (PROTONIX) 40 MG tablet Take 1 tablet (40 mg total) by mouth daily. 30 tablet 3   potassium chloride SA (KLOR-CON M) 20 MEQ tablet Take 1 tablet (20 mEq total) by mouth 2 (two) times daily. (Patient taking differently: Take 20 mEq by mouth 3 (three) times daily.)     rosuvastatin (CRESTOR) 40 MG tablet Take 1 tablet (40 mg total) by mouth daily. (Patient taking differently: Take 40 mg by mouth every evening.) 30 tablet 11   torsemide (DEMADEX) 20 MG tablet Take 4 tablets (80 mg total) by mouth 2 (two) times daily. 240 tablet 6    vitamin C (ASCORBIC ACID) 250 MG tablet Take 500 mg by mouth daily.     VITAMIN D PO Take 4,000 Units by mouth daily.     No current facility-administered medications for this visit.    PHYSICAL EXAMINATION: ECOG PERFORMANCE STATUS: 3 - Symptomatic, >50% confined to bed  Vitals:   01/14/23 1447  BP: (!) 135/54  Pulse: 60  Resp: 19  Temp: 97.6 F (36.4 C)  SpO2: 100%   Wt Readings from Last 3 Encounters:  01/14/23 141 lb 8 oz (64.2 kg)  12/31/22 134 lb (60.8 kg)  12/17/22 134 lb 8 oz (61 kg)     GENERAL:alert, no distress and comfortable SKIN: skin color normal, no rashes or significant lesions EYES: normal, Conjunctiva are pink and non-injected, sclera clear  NEURO: alert & oriented x 3 with fluent speech LUNGS: (+)clear to auscultation and percussion with normal breathing effort HEART: (-) regular rate & rhythm and no murmurs and (+)lower extremity edema ABDOMEN:(+)abdomen soft, (-)non-tender and normal bowel sounds LABORATORY DATA:  I have reviewed the data as listed    Latest Ref Rng & Units 01/14/2023    2:26 PM 12/30/2022    1:27 PM 12/17/2022    2:05 PM  CBC  WBC 4.0 - 10.5 K/uL 4.1  2.9  2.8   Hemoglobin 12.0 - 15.0 g/dL 7.0  7.2  7.7   Hematocrit 36.0 - 46.0 % 23.9  24.5  25.3   Platelets 150 - 400 K/uL 182  158  158         Latest Ref Rng & Units 01/14/2023    2:26 PM 12/30/2022    1:27 PM 12/17/2022    2:05 PM  CMP  Glucose 70 - 99 mg/dL 75  161  95   BUN 8 - 23 mg/dL 46  48  39   Creatinine 0.44 - 1.00 mg/dL 0.96  0.45  4.09   Sodium 135 - 145 mmol/L 139  139  140   Potassium 3.5 -  5.1 mmol/L 4.9  4.5  4.7   Chloride 98 - 111 mmol/L 102  100  99   CO2 22 - 32 mmol/L 32  32  34   Calcium 8.9 - 10.3 mg/dL 9.1  9.1  9.2   Total Protein 6.5 - 8.1 g/dL 7.2  7.1  7.1   Total Bilirubin 0.3 - 1.2 mg/dL 0.6  0.5  0.5   Alkaline Phos 38 - 126 U/L 40  38  42   AST 15 - 41 U/L 12  14  15    ALT 0 - 44 U/L 7  6  7        RADIOGRAPHIC STUDIES: I have  personally reviewed the radiological images as listed and agreed with the findings in the report. No results found.    Orders Placed This Encounter  Procedures   Sample to Blood Bank    Standing Status:   Standing    Number of Occurrences:   20    Standing Expiration Date:   01/14/2024   All questions were answered. The patient knows to call the clinic with any problems, questions or concerns. No barriers to learning was detected. The total time spent in the appointment was 40 minutes, including multiple phone calls with her daughter, granddaughter and ED charge nurse.  I spent more than 50% time on face-to-face counseling.     Malachy Mood, MD 01/14/2023   Carolin Coy, CMA, am acting as scribe for Malachy Mood, MD.   I have reviewed the above documentation for accuracy and completeness, and I agree with the above.

## 2023-01-14 NOTE — Telephone Encounter (Addendum)
Attempted call to patient to provide [place], [place] recommendations but no answer or voice mail option.  Pt had several office appointments today.

## 2023-01-14 NOTE — Telephone Encounter (Signed)
Spoke with patient.  Advised Shanda Bumps has requested a manual transmission to be sent for review.  Assisted with sending remote manual transmission.  Pt audibly SOB during conversation.   Transmission received.     Copy sent to Prince Rome, NP at [facility] clinic for review as requested.    [employer] suggesting possible fluid accumulation n starting 6/11.

## 2023-01-14 NOTE — Assessment & Plan Note (Addendum)
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 -She previously had reaction to blood transfusion, or required multiple premedications for blood transfusion -She was recently hospitalized for anemia and a GI bleeding in July 2024. -She is currently on ESA every 2 weeks, IV iron as needed, and depression infusion as needed.

## 2023-01-14 NOTE — Telephone Encounter (Signed)
Attempted call to patient to provide [place], [place] recommendations but no answer or voice mail option.

## 2023-01-14 NOTE — Telephone Encounter (Signed)
  Received: Today [place], Anderson Malta, FNP  Lynett Brasil, Josephine Igo, RN Caller: Unspecified (Today, 10:41 AM) Continue with previous torsemide instructions, but would also have her take metolazone 2.5 mg + extra 40 KCL for a 1 time dose today.

## 2023-01-15 ENCOUNTER — Inpatient Hospital Stay (HOSPITAL_COMMUNITY)
Admission: EM | Admit: 2023-01-15 | Discharge: 2023-01-18 | DRG: 280 | Disposition: A | Discharge status: Home Health | Payer: PPO | Attending: Internal Medicine | Admitting: Internal Medicine

## 2023-01-15 ENCOUNTER — Ambulatory Visit: Payer: Self-pay

## 2023-01-15 ENCOUNTER — Ambulatory Visit: Payer: PPO | Admitting: Hematology

## 2023-01-15 ENCOUNTER — Encounter (HOSPITAL_COMMUNITY): Payer: Self-pay | Admitting: Emergency Medicine

## 2023-01-15 DIAGNOSIS — R0602 Shortness of breath: Secondary | ICD-10-CM | POA: Diagnosis not present

## 2023-01-15 DIAGNOSIS — I509 Heart failure, unspecified: Secondary | ICD-10-CM

## 2023-01-15 DIAGNOSIS — J9611 Chronic respiratory failure with hypoxia: Secondary | ICD-10-CM | POA: Diagnosis present

## 2023-01-15 DIAGNOSIS — D649 Anemia, unspecified: Secondary | ICD-10-CM | POA: Diagnosis not present

## 2023-01-15 LAB — CBC WITH DIFFERENTIAL/PLATELET
Abs Immature Granulocytes: 0.01 10*3/uL (ref 0.00–0.07)
Basophils Absolute: 0 10*3/uL (ref 0.0–0.1)
Basophils Relative: 1 %
Eosinophils Absolute: 0 10*3/uL (ref 0.0–0.5)
Eosinophils Relative: 0 %
HCT: 24.2 % — ABNORMAL LOW (ref 36.0–46.0)
Hemoglobin: 6.8 g/dL — CL (ref 12.0–15.0)
Immature Granulocytes: 0 %
Lymphocytes Relative: 8 %
Lymphs Abs: 0.3 10*3/uL — ABNORMAL LOW (ref 0.7–4.0)
MCH: 26.1 pg (ref 26.0–34.0)
MCHC: 28.1 g/dL — ABNORMAL LOW (ref 30.0–36.0)
MCV: 92.7 fL (ref 80.0–100.0)
Monocytes Absolute: 0.4 10*3/uL (ref 0.1–1.0)
Monocytes Relative: 9 %
Neutro Abs: 3.2 10*3/uL (ref 1.7–7.7)
Neutrophils Relative %: 82 %
Platelets: 183 10*3/uL (ref 150–400)
RBC: 2.61 MIL/uL — ABNORMAL LOW (ref 3.87–5.11)
RDW: 18 % — ABNORMAL HIGH (ref 11.5–15.5)
WBC: 3.9 10*3/uL — ABNORMAL LOW (ref 4.0–10.5)
nRBC: 0 % (ref 0.0–0.2)

## 2023-01-15 LAB — CHROMOGRANIN A: Chromogranin A (ng/mL): 86.3 ng/mL (ref 0.0–101.8)

## 2023-01-15 NOTE — Patient Outreach (Signed)
  Care Coordination   01/15/2023 Name: [name] MRN: [id number] DOB: 09/25/1933   Care Coordination Outreach Attempts:  Contact made with patient.  Patient states she is headed to the hospital and request call at another time.   Follow Up Plan:  Additional outreach attempts will be made to offer the patient care coordination information and services.   Encounter Outcome:  Patient Request to Call Back   Care Coordination Interventions:  No, not indicated    George Ina [facility] Care Coordination [phone] direct line

## 2023-01-15 NOTE — Telephone Encounter (Signed)
Spoke with patient.  She had appt with Cancer doctor yesterday and the physician wanted to admit her to the hospital yesterday but she chose to wait until today to be admitted.  She will be admitted for swelling, SOB and low Hgb later today.    Advised of Shanda Bumps [place]'s recommendations to take Metolazone and extra Potassium but she not take it if she is going to be admitted to the hospital today.   Advised if she does not go to ED today, she will take 1 dose of Metolazone 2.5 mg 1 tablet with additional Potassium 40 mEq .  Advised the Metolazone script has been sent to pharmacy.  She verbalized understanding.  Copy sent to [facility] as FYI.

## 2023-01-15 NOTE — ED Triage Notes (Addendum)
PT hx of anemia. Cancer ctr yesterday showed hbg 7 so recommended that she get blood transfusion. She got a transfusion 2 weeks ago. She has had severe reactions in past. Has to be premedicated and blood comes from [place] per patient. She is also reporting bilateral leg swelling but she reports ONC saw her yesterday and is aware of the swelling.

## 2023-01-15 NOTE — Telephone Encounter (Signed)
Attempted call to patient to provide [place], [place] but line is busy

## 2023-01-16 ENCOUNTER — Inpatient Hospital Stay (HOSPITAL_COMMUNITY): Payer: PPO

## 2023-01-16 ENCOUNTER — Other Ambulatory Visit: Payer: Self-pay

## 2023-01-16 ENCOUNTER — Emergency Department (HOSPITAL_COMMUNITY): Payer: PPO

## 2023-01-16 DIAGNOSIS — I6523 Occlusion and stenosis of bilateral carotid arteries: Secondary | ICD-10-CM | POA: Diagnosis not present

## 2023-01-16 DIAGNOSIS — I5033 Acute on chronic diastolic (congestive) heart failure: Secondary | ICD-10-CM

## 2023-01-16 DIAGNOSIS — Z8616 Personal history of COVID-19: Secondary | ICD-10-CM | POA: Diagnosis not present

## 2023-01-16 DIAGNOSIS — I739 Peripheral vascular disease, unspecified: Secondary | ICD-10-CM | POA: Diagnosis not present

## 2023-01-16 DIAGNOSIS — Z95810 Presence of automatic (implantable) cardiac defibrillator: Secondary | ICD-10-CM | POA: Diagnosis not present

## 2023-01-16 DIAGNOSIS — N1832 Chronic kidney disease, stage 3b: Secondary | ICD-10-CM | POA: Diagnosis not present

## 2023-01-16 DIAGNOSIS — J9611 Chronic respiratory failure with hypoxia: Secondary | ICD-10-CM | POA: Diagnosis not present

## 2023-01-16 DIAGNOSIS — R0602 Shortness of breath: Secondary | ICD-10-CM | POA: Diagnosis not present

## 2023-01-16 DIAGNOSIS — I130 Hypertensive heart and chronic kidney disease with heart failure and stage 1 through stage 4 chronic kidney disease, or unspecified chronic kidney disease: Secondary | ICD-10-CM | POA: Diagnosis not present

## 2023-01-16 DIAGNOSIS — Z87891 Personal history of nicotine dependence: Secondary | ICD-10-CM | POA: Diagnosis not present

## 2023-01-16 DIAGNOSIS — D509 Iron deficiency anemia, unspecified: Secondary | ICD-10-CM | POA: Diagnosis not present

## 2023-01-16 DIAGNOSIS — R918 Other nonspecific abnormal finding of lung field: Secondary | ICD-10-CM | POA: Diagnosis not present

## 2023-01-16 DIAGNOSIS — J849 Interstitial pulmonary disease, unspecified: Secondary | ICD-10-CM | POA: Diagnosis not present

## 2023-01-16 DIAGNOSIS — I21A1 Myocardial infarction type 2: Secondary | ICD-10-CM | POA: Diagnosis not present

## 2023-01-16 DIAGNOSIS — Z85528 Personal history of other malignant neoplasm of kidney: Secondary | ICD-10-CM | POA: Diagnosis not present

## 2023-01-16 DIAGNOSIS — I071 Rheumatic tricuspid insufficiency: Secondary | ICD-10-CM | POA: Diagnosis not present

## 2023-01-16 DIAGNOSIS — J90 Pleural effusion, not elsewhere classified: Secondary | ICD-10-CM | POA: Diagnosis not present

## 2023-01-16 DIAGNOSIS — J449 Chronic obstructive pulmonary disease, unspecified: Secondary | ICD-10-CM | POA: Diagnosis not present

## 2023-01-16 DIAGNOSIS — J9811 Atelectasis: Secondary | ICD-10-CM | POA: Diagnosis not present

## 2023-01-16 DIAGNOSIS — K5520 Angiodysplasia of colon without hemorrhage: Secondary | ICD-10-CM | POA: Diagnosis not present

## 2023-01-16 DIAGNOSIS — D649 Anemia, unspecified: Secondary | ICD-10-CM | POA: Diagnosis not present

## 2023-01-16 DIAGNOSIS — E7800 Pure hypercholesterolemia, unspecified: Secondary | ICD-10-CM | POA: Diagnosis not present

## 2023-01-16 DIAGNOSIS — I509 Heart failure, unspecified: Secondary | ICD-10-CM

## 2023-01-16 DIAGNOSIS — I272 Pulmonary hypertension, unspecified: Secondary | ICD-10-CM | POA: Diagnosis not present

## 2023-01-16 DIAGNOSIS — Z85828 Personal history of other malignant neoplasm of skin: Secondary | ICD-10-CM | POA: Diagnosis not present

## 2023-01-16 DIAGNOSIS — I4821 Permanent atrial fibrillation: Secondary | ICD-10-CM | POA: Diagnosis not present

## 2023-01-16 DIAGNOSIS — D3A8 Other benign neuroendocrine tumors: Secondary | ICD-10-CM | POA: Diagnosis not present

## 2023-01-16 DIAGNOSIS — D500 Iron deficiency anemia secondary to blood loss (chronic): Secondary | ICD-10-CM | POA: Diagnosis not present

## 2023-01-16 DIAGNOSIS — R531 Weakness: Secondary | ICD-10-CM | POA: Diagnosis present

## 2023-01-16 DIAGNOSIS — I5023 Acute on chronic systolic (congestive) heart failure: Secondary | ICD-10-CM | POA: Diagnosis not present

## 2023-01-16 DIAGNOSIS — K7460 Unspecified cirrhosis of liver: Secondary | ICD-10-CM | POA: Diagnosis not present

## 2023-01-16 DIAGNOSIS — Z66 Do not resuscitate: Secondary | ICD-10-CM | POA: Diagnosis not present

## 2023-01-16 DIAGNOSIS — K5521 Angiodysplasia of colon with hemorrhage: Secondary | ICD-10-CM | POA: Diagnosis not present

## 2023-01-16 LAB — VITAMIN B12: Vitamin B-12: 709 pg/mL (ref 180–914)

## 2023-01-16 LAB — BASIC METABOLIC PANEL
Anion gap: 9 (ref 5–15)
Anion gap: 11 (ref 5–15)
BUN: 43 mg/dL — ABNORMAL HIGH (ref 8–23)
BUN: 42 mg/dL — ABNORMAL HIGH (ref 8–23)
CO2: 28 mmol/L (ref 22–32)
CO2: 29 mmol/L (ref 22–32)
Calcium: 8.7 mg/dL — ABNORMAL LOW (ref 8.9–10.3)
Calcium: 8.8 mg/dL — ABNORMAL LOW (ref 8.9–10.3)
Chloride: 99 mmol/L (ref 98–111)
Chloride: 97 mmol/L — ABNORMAL LOW (ref 98–111)
Creatinine, Ser: 1.81 mg/dL — ABNORMAL HIGH (ref 0.44–1.00)
Creatinine, Ser: 1.72 mg/dL — ABNORMAL HIGH (ref 0.44–1.00)
GFR, Estimated: 27 mL/min — ABNORMAL LOW (ref >60)
GFR, Estimated: 29 mL/min — ABNORMAL LOW (ref >60)
Glucose, Bld: 191 mg/dL — ABNORMAL HIGH (ref 70–99)
Glucose, Bld: 100 mg/dL — ABNORMAL HIGH (ref 70–99)
Potassium: 4.4 mmol/L (ref 3.5–5.1)
Potassium: 4 mmol/L (ref 3.5–5.1)
Sodium: 136 mmol/L (ref 135–145)
Sodium: 137 mmol/L (ref 135–145)

## 2023-01-16 LAB — IRON AND TIBC
Iron: 36 ug/dL (ref 28–170)
Saturation Ratios: 11 % (ref 10.4–31.8)
TIBC: 340 ug/dL (ref 250–450)
UIBC: 304 ug/dL

## 2023-01-16 LAB — HEMOGLOBIN AND HEMATOCRIT, BLOOD
HCT: 29.5 % — ABNORMAL LOW (ref 36.0–46.0)
Hemoglobin: 8.8 g/dL — ABNORMAL LOW (ref 12.0–15.0)

## 2023-01-16 LAB — BRAIN NATRIURETIC PEPTIDE: B Natriuretic Peptide: 342.9 pg/mL — ABNORMAL HIGH (ref 0.0–100.0)

## 2023-01-16 LAB — ECHOCARDIOGRAM COMPLETE
AR max vel: 1.14 cm2
AV Area VTI: 1.1 cm2
AV Area mean vel: 1.18 cm2
AV Mean grad: 17 mmHg
AV Peak grad: 28.7 mmHg
Ao pk vel: 2.68 m/s
Area-P 1/2: 4.36 cm2
Height: 66 in
MV VTI: 1.27 cm2
S' Lateral: 2.2 cm
Weight: 2240 [oz_av]

## 2023-01-16 LAB — GLUCOSE, CAPILLARY
Glucose-Capillary: 176 mg/dL — ABNORMAL HIGH (ref 70–99)
Glucose-Capillary: 129 mg/dL — ABNORMAL HIGH (ref 70–99)

## 2023-01-16 LAB — PREPARE RBC (CROSSMATCH)

## 2023-01-16 LAB — RETICULOCYTES
Immature Retic Fract: 25.6 % — ABNORMAL HIGH (ref 2.3–15.9)
RBC.: 2.57 MIL/uL — ABNORMAL LOW (ref 3.87–5.11)
Retic Count, Absolute: 114.9 10*3/uL (ref 19.0–186.0)
Retic Ct Pct: 4.5 % — ABNORMAL HIGH (ref 0.4–3.1)

## 2023-01-16 LAB — FOLATE: Folate: 32.5 ng/mL (ref >5.9)

## 2023-01-16 LAB — TROPONIN I (HIGH SENSITIVITY)
Troponin I (High Sensitivity): 35 ng/L — ABNORMAL HIGH (ref <18)
Troponin I (High Sensitivity): 33 ng/L — ABNORMAL HIGH (ref <18)

## 2023-01-16 LAB — MAGNESIUM: Magnesium: 2.3 mg/dL (ref 1.7–2.4)

## 2023-01-16 LAB — FERRITIN: Ferritin: 15 ng/mL (ref 11–307)

## 2023-01-16 LAB — PHOSPHORUS: Phosphorus: 4 mg/dL (ref 2.5–4.6)

## 2023-01-16 LAB — CBG MONITORING, ED: Glucose-Capillary: 99 mg/dL (ref 70–99)

## 2023-01-16 MED ORDER — FUROSEMIDE 10 MG/ML IJ SOLN: 80.0000 mg | Freq: Once | INTRAMUSCULAR | Status: DC

## 2023-01-16 MED ORDER — ACETAMINOPHEN 325 MG PO TABS
650.0000 mg | ORAL_TABLET | ORAL | Status: AC | PRN
  Administered 2023-01-16: 650 mg via ORAL
  Filled 2023-01-16: qty 2

## 2023-01-16 MED ORDER — METHYLPREDNISOLONE SODIUM SUCC 125 MG IJ SOLR
125.0000 mg | INTRAMUSCULAR | Status: DC | PRN
  Administered 2023-01-16: 125 mg via INTRAVENOUS
  Filled 2023-01-16: qty 2

## 2023-01-16 MED ORDER — PANTOPRAZOLE SODIUM 40 MG PO TBEC
40.0000 mg | DELAYED_RELEASE_TABLET | Freq: Every day | ORAL | Status: DC
  Administered 2023-01-16 – 2023-01-18 (×3): 40 mg via ORAL
  Filled 2023-01-16 (×3): qty 1

## 2023-01-16 MED ORDER — ALBUTEROL SULFATE (2.5 MG/3ML) 0.083% IN NEBU: 2.5000 mg | INHALATION_SOLUTION | RESPIRATORY_TRACT | Status: DC | PRN

## 2023-01-16 MED ORDER — TORSEMIDE 100 MG PO TABS
100.0000 mg | ORAL_TABLET | Freq: Two times a day (BID) | ORAL | Status: DC
  Administered 2023-01-16 – 2023-01-18 (×4): 100 mg via ORAL
  Filled 2023-01-16 (×4): qty 1

## 2023-01-16 MED ORDER — FAMOTIDINE IN NACL 20-0.9 MG/50ML-% IV SOLN
20.0000 mg | INTRAVENOUS | Status: DC | PRN
  Administered 2023-01-16: 20 mg via INTRAVENOUS
  Filled 2023-01-16: qty 50

## 2023-01-16 MED ORDER — DIPHENHYDRAMINE HCL 25 MG PO CAPS
50.0000 mg | ORAL_CAPSULE | ORAL | Status: AC | PRN
  Administered 2023-01-16: 50 mg via ORAL
  Filled 2023-01-16: qty 2

## 2023-01-16 MED ORDER — FUROSEMIDE 10 MG/ML IJ SOLN
40.0000 mg | INTRAMUSCULAR | Status: AC
  Administered 2023-01-16: 40 mg via INTRAVENOUS
  Filled 2023-01-16: qty 4

## 2023-01-16 MED ORDER — INSULIN ASPART 100 UNIT/ML IJ SOLN
0.0000 [IU] | Freq: Three times a day (TID) | INTRAMUSCULAR | Status: DC
  Administered 2023-01-16: 1 [IU] via SUBCUTANEOUS

## 2023-01-16 MED ORDER — ACETAMINOPHEN 325 MG PO TABS: 650.0000 mg | ORAL_TABLET | Freq: Four times a day (QID) | ORAL | Status: DC | PRN

## 2023-01-16 MED ORDER — SODIUM CHLORIDE 0.9% IV SOLUTION: Freq: Once | INTRAVENOUS | Status: AC

## 2023-01-16 MED ORDER — IPRATROPIUM-ALBUTEROL 0.5-2.5 (3) MG/3ML IN SOLN
3.0000 mL | Freq: Four times a day (QID) | RESPIRATORY_TRACT | Status: DC
  Administered 2023-01-16 – 2023-01-18 (×6): 3 mL via RESPIRATORY_TRACT
  Filled 2023-01-16 (×7): qty 3

## 2023-01-16 MED ORDER — FUROSEMIDE 10 MG/ML IJ SOLN
160.0000 mg | Freq: Once | INTRAVENOUS | Status: AC
  Administered 2023-01-16: 160 mg via INTRAVENOUS
  Filled 2023-01-16: qty 16

## 2023-01-16 MED ORDER — VITAMIN B-12 1000 MCG PO TABS
ORAL_TABLET | Freq: Every day | ORAL | Status: DC
  Administered 2023-01-16 – 2023-01-18 (×3): 1000 ug via ORAL
  Filled 2023-01-16 (×3): qty 1

## 2023-01-16 MED ORDER — FERROUS GLUCONATE 324 (38 FE) MG PO TABS
324.0000 mg | ORAL_TABLET | Freq: Every day | ORAL | Status: DC
  Administered 2023-01-16 – 2023-01-18 (×3): 324 mg via ORAL
  Filled 2023-01-16 (×3): qty 1

## 2023-01-16 MED ORDER — VITAMIN C 500 MG PO TABS
500.0000 mg | ORAL_TABLET | Freq: Every day | ORAL | Status: DC
  Administered 2023-01-16 – 2023-01-18 (×3): 500 mg via ORAL
  Filled 2023-01-16 (×3): qty 1

## 2023-01-16 MED ORDER — ROSUVASTATIN CALCIUM 20 MG PO TABS
40.0000 mg | ORAL_TABLET | Freq: Every evening | ORAL | Status: DC
  Administered 2023-01-16 – 2023-01-17 (×2): 40 mg via ORAL
  Filled 2023-01-16 (×2): qty 2

## 2023-01-16 NOTE — Progress Notes (Addendum)
PROGRESS NOTE    [name]  ZDG:[id number] DOB: [birth year] DOA: 01/15/2023 PCP: [name], MD    Brief Narrative:   [name] is a 89 y.o. 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 ED on 9/11 with shortness of breath, increased lower leg swelling.  Was seen at cancer center day prior with labs showing hemoglobin of 7 with recommendation of blood transfusion.  Patient notes progressive dyspnea over the last 2 weeks, last blood transfusion 2 weeks prior.  Decreased exercise tolerance.  Also reports increased lower extremity edema in which she called her cardiology office and advised to increase your torsemide to 100 mg p.o. twice daily and was also given a prescription for metolazone, but has yet to use.  Has complicated history of antibodies and transfusion reactions requiring pretreatment.  Normally gets 2 units of blood if hemoglobin less than 7 per oncology note.  Has had recent melena, but none for several days now.  In the ED, temperature 97.9 F, HR 59, RR 18, BP 121/87, SpO2 94% on 3 L nasal cannula.  WBC 3.9, hemoglobin 6.8, platelet count 183.  Sodium 136, potassium 4.4, chloride 99, CO2 28, glucose 191, BUN 43, creatinine 1.81.  BNP 342.9.  Chest x-ray with stable chronic appearing increased interstitial lung markings with mild bibasilar atelectasis and small bilateral pleural effusions.  IV Lasix ordered by EDP.  TRH consulted for admission for further evaluation and management of symptomatic anemia, CHF  exacerbation.  Assessment & Plan:    Symptomatic anemia, acute on chronic Hx AVMs Patient presenting with progressive dyspnea and found to have hemoglobin 6.8.  Etiology likely secondary to chronic GI blood loss/iron deficiency anemia.  Anemia panel with iron 36, TIBC 340, ferritin 15, folate 32.5, vitamin B12 709. -- 2 unit PRBC ordered -- Pretreatment for transfusions: Tylenol 975 mg PO, Benadryl 50 mg PO, Pepcid 20 mg IV, and Methylprednisolone 125 mg IV x 1 prior to transfusions ordered prn  -- Repeat H&H 2 hours following transfusion -- CBC daily  Acute on chronic diastolic congestive heart failure exacerbation Patient presenting with progressive dyspnea, increased lower extremity edema.  BNP elevated at 342.9.  Chest x-ray with no pulmonary edema.  TTE 02/15/2022 with LVEF 60 to 65%, grade 2 diastolic dysfunction, biatrial enlargement, moderate/severe TR, mild aortic valve stenosis, IVC dilated. -- TTE: Pending -- Continue torsemide 100 mg p.o. twice daily -- Lasix 40 mg IV in between blood transfusions -- Strict I's and O's and daily weights  Elevated troponin High sensitive troponin elevated 35 followed by 33.  Etiology likely secondary to type II demand ischemia in the setting of anemia and CHF exacerbation. -- Continue to monitor on telemetry  Paroxysmal atrial fibrillation off of anticoagulation Hx PPM/ICD Off of anticoagulation due to history of GI bleeding from AVMs.  Has PPM, ICD has been deactivated per chart review. -- Continue monitor on telemetry  CAD History of TIA Carotid artery stenosis Continue Crestor 40 mg p.o. daily.  Not on aspirin.  History of essential hypertension Currently not on antihypertensives outpatient.  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 -- Albuterol nebs every 2 hours as needed  wheezing/shortness of breath -- Continue supplemental oxygen, maintain SpO2 greater than 88%  CKD stage IIIb Hx renal cell carcinoma with history of nephrectomy Baseline creatinine 1.5-1.6. -- Cr 1.81>1.72 -- BMP daily  Pancreatic neuroendocrine tumor Follows with medical oncology, Dr. Mosetta Putt outpatient.  On Sandostatin injections.  Weakness/deconditioning/debility: -- PT/OT evaluation    DVT prophylaxis: SCDs Start: 01/16/23 0250    Code Status: Limited: Do not attempt resuscitation (DNR) -DNR-LIMITED -Do Not Intubate/DNI  Family Communication:   Disposition Plan:  Level of care: Med-Surg Status is: Inpatient Remains inpatient appropriate because: Blood transfusion    Consultants:  None  Procedures:  TTE: Pending  Antimicrobials:  None   Subjective: Patient seen examined bedside, resting calmly.  Sitting at edge of bed.  Remains in ED holding area.  Eating breakfast.  Continues with mild dyspnea.  Asking when her blood will be transfused.  Discussed with RN, getting blood units ready and needs pretreatment medications before able.  No other specific questions or concerns at this time.  Denies headache, no dizziness, no chest pain, no palpitations, no abdominal pain, no fever/chills/night sweats, no nausea cefonicid diarrhea, no focal weakness, no paresthesias.  No acute events overnight per nursing staff.  Objective: Vitals:   01/16/23 0919 01/16/23 0936 01/16/23 1000 01/16/23 1004  BP: (!) 152/52 (!) 154/55 (!) 147/72   Pulse: 62 70 64   Resp: 16 17 (!) 26   Temp: (!) 97.5 F (36.4 C) 97.8 F (36.6 C)    TempSrc: Oral Oral    SpO2: 100% 100% 99%   Weight:    63.5 kg  Height:    5\' 6"  (1.676 m)   No intake or output data in the 24 hours ending 01/16/23 1017 Filed Weights   01/16/23 1004  Weight: 63.5 kg    Examination:  Physical Exam: GEN: NAD, alert and oriented x 3, chronically ill in appearance HEENT: NCAT, PERRL, EOMI, sclera clear, MMM PULM:  Crackles noted bilateral bases, no wheezing, normal respiratory effort, on 2 L nasal cannula which is her baseline CV: RRR w/ 2/6 SEM, no rub/gallop GI: abd soft, NTND, NABS MSK: 3+ pitting edema bilateral lower extremities extending to mid thigh/presacrum with chronic venous changes, symmetric, moves all extremities independently with preserved muscle strength. NEURO: CN II-XII intact, no focal deficits, sensation to light touch intact PSYCH: normal mood/affect    Data Reviewed: I have personally reviewed following labs and imaging studies  CBC: Recent Labs  Lab 01/14/23 1426 01/15/23 2318  WBC 4.1 3.9*  NEUTROABS 3.3 3.2  HGB 7.0* 6.8*  HCT 23.9* 24.2*  MCV 92.6 92.7  PLT 182 183   Basic Metabolic Panel: Recent Labs  Lab 01/14/23 1426 01/15/23 2318 01/16/23 0327  NA 139 136 137  K 4.9 4.4 4.0  CL 102 99 97*  CO2 32 28 29  GLUCOSE 75 191* 100*  BUN 46* 43* 42*  CREATININE 1.83* 1.81* 1.72*  CALCIUM 9.1 8.7* 8.8*  MG  --   --  2.3  PHOS  --   --  4.0   GFR: Estimated Creatinine Clearance: 22 mL/min (A) (by C-G formula based on SCr of 1.72 mg/dL (H)). Liver Function Tests: Recent Labs  Lab 01/14/23 1426  AST 12*  ALT 7  ALKPHOS 40  BILITOT 0.6  PROT 7.2  ALBUMIN 4.1   No results for input(s): "  LIPASE", "AMYLASE" in the last 168 hours. No results for input(s): "AMMONIA" in the last 168 hours. Coagulation Profile: No results for input(s): "INR", "PROTIME" in the last 168 hours. Cardiac Enzymes: No results for input(s): "CKTOTAL", "CKMB", "CKMBINDEX", "TROPONINI" in the last 168 hours. BNP (last 3 results) No results for input(s): "PROBNP" in the last 8760 hours. HbA1C: No results for input(s): "HGBA1C" in the last 72 hours. CBG: No results for input(s): "GLUCAP" in the last 168 hours. Lipid Profile: No results for input(s): "CHOL", "HDL", "LDLCALC", "TRIG", "CHOLHDL", "LDLDIRECT" in the last 72 hours. Thyroid Function Tests: No results for input(s):  "TSH", "T4TOTAL", "FREET4", "T3FREE", "THYROIDAB" in the last 72 hours. Anemia Panel: Recent Labs    01/16/23 0829  VITAMINB12 709  FERRITIN 15  TIBC 340  IRON 36  RETICCTPCT 4.5*   Sepsis Labs: No results for input(s): "PROCALCITON", "LATICACIDVEN" in the last 168 hours.  No results found for this or any previous visit (from the past 240 hour(s)).       Radiology Studies: DG Chest Port 1 View  Result Date: 01/16/2023 CLINICAL DATA:  Shortness of breath. EXAM: PORTABLE CHEST 1 VIEW COMPARISON:  December 09, 2022 FINDINGS: There is stable multilead AICD positioning. The heart size and mediastinal contours are within normal limits. There is marked severity calcification of the aortic arch. Mild, diffuse, chronic appearing increased interstitial lung markings are seen. Mild, stable atelectasis is seen within the bilateral lung bases with small, stable bilateral pleural effusions. No pneumothorax is identified. No acute osseous abnormalities are identified. IMPRESSION: Stable, chronic appearing increased interstitial lung markings with mild bibasilar atelectasis and small bilateral pleural effusions. Electronically Signed   By: Aram Candela M.D.   On: 01/16/2023 01:19        Scheduled Meds:  sodium chloride   Intravenous Once   vitamin C  500 mg Oral Daily   cyanocobalamin   Oral Daily   ferrous gluconate  324 mg Oral Q breakfast   insulin aspart  0-6 Units Subcutaneous TID WC   ipratropium-albuterol  3 mL Nebulization Q6H   pantoprazole  40 mg Oral Daily   rosuvastatin  40 mg Oral QPM   torsemide  100 mg Oral BID   Continuous Infusions:  famotidine (PEPCID) IV 20 mg (01/16/23 0846)     LOS: 0 days    Time spent: 53 minutes spent on chart review, discussion with nursing staff, consultants, updating family and interview/physical exam; more than 50% of that time was spent in counseling and/or coordination of care.    Alvira Philips [place], DO [facility] Available via  Epic secure chat 7am-7pm After these hours, please refer to coverage provider listed on [email or web address] 01/16/2023, 10:17 AM

## 2023-01-16 NOTE — ED Notes (Signed)
Patient with positive blood antibodies, Blood bank called and told me the 2 units of blood will be coming from [place] so to be aware of a slight delay receiving it.

## 2023-01-16 NOTE — ED Notes (Signed)
ED TO INPATIENT HANDOFF REPORT  ED Nurse Name and Phone #:  Janei Scheff [phone]  S Name/Age/Gender [name] 89 y.o. female Room/Bed: 044C/044C  Code Status   Code Status: Limited: Do not attempt resuscitation (DNR) -DNR-LIMITED -Do Not Intubate/DNI   Home/SNF/Other Home Patient oriented to: situation Is this baseline? Yes   Triage Complete: Triage complete  Chief Complaint Acute exacerbation of CHF (congestive heart failure) (HCC) [I50.9]  Triage Note PT hx of anemia. Cancer ctr yesterday showed hbg 7 so recommended that she get blood transfusion. She got a transfusion 2 weeks ago. She has had severe reactions in past. Has to be premedicated and blood comes from [place] per patient. She is also reporting bilateral leg swelling but she reports ONC saw her yesterday and is aware of the swelling.    Allergies Allergies  Allergen Reactions   Codeine Nausea And Vomiting    Hallucinations    Morphine And Codeine Nausea And Vomiting    Hallucinations    Amoxicillin Nausea Only    Level of Care/Admitting Diagnosis ED Disposition     ED Disposition  Admit   Condition  --   Comment  Hospital Area: [facility] [[id number]]  Level of Care: Med-Surg [[id number]]  May admit patient to [facility] or [facility] if equivalent level of care is available:: Yes  Covid Evaluation: Asymptomatic - no recent exposure (last 10 days) testing not required  Diagnosis: Acute exacerbation of CHF (congestive heart failure) [facility]) [[id number]]  Admitting Physician: Dolly Rias [[id number]]  Attending Physician: Dolly Rias [phone]  Certification:: I certify this patient will need inpatient services for at least 2 midnights  Expected Medical Readiness: 01/18/2023          B Medical/Surgery History Past Medical History:  Diagnosis Date   (HFpEF) heart failure with preserved ejection fraction (HCC)    EF=60-60%   Actinic keratosis 01/17/2015   R forearm   Adjustment  disorder with anxiety    Adverse effect of other narcotics, sequela    Intolerance to all narcotics   Anti-Duffy antibodies present    Aortic valve stenosis    Arthritis    "some in my hands" (11/11/2012)   Atrial fibrillation, permanent (HCC)    Eliquis   Atypical mole 03/25/2018   L forearm - severe   Automatic implantable cardioverter-defibrillator in situ    Avascular necrosis of hip (HCC) 05/03/2011   Carotid artery stenosis 09/2007   a. 09/2007: 60-79% bilateral (stable); b. 10/2008: 40-59% R 60-79%    Cellulitis of left lower extremity 07/13/2020   CKD (chronic kidney disease), stage III (HCC)    COPD (chronic obstructive pulmonary disease) (HCC)    Coronary artery disease    non-obstructive by 2006 cath   COVID-19 virus infection 02/12/2022   Displaced fracture of left femoral neck (HCC) 10/09/2018   GI bleed 03/28/2020   AVM   High cholesterol    Hypertension 05/20/2011   Hypertr obst cardiomyop    Hypotension, unspecified    cardiac cath 2006..nonobstructive CAD 30-40s lesions.Marland KitchenETT 1/09 nondiagnostic due to poor HR response..Right Renal Cancer 2003   Iron deficiency anemia    Long term (current) use of anticoagulants    On home oxygen therapy    3L [place]   Osteoarthritis of right hip    PAD (peripheral artery disease) (HCC)    Pancreatic cancer (HCC)    sandostatin   PONV (postoperative nausea and vomiting)    Presence of permanent cardiac pacemaker  Pulmonary HTN (HCC)    RECTAL BLEEDING 10/13/2009   Qualifier: Diagnosis of  By: Wilmon Pali NP, Paula     Red blood cell antibody positive with compatible PRBC difficult to obtain    Anti FYA (Duffy a) antibody. Must be transfused with PRB which are Duffy Antigen Negative and Crossmatch Compatible   Renal cell carcinoma (HCC)    s/p nephrectomy   Squamous cell carcinoma of skin 01/17/2015   R lat wrist   Squamous cell carcinoma of skin 01/29/2018   R post upper leg - superficially invasive   Squamous cell carcinoma of  skin 03/25/2018   L lat foot   Squamous cell carcinoma of skin 11/09/2020   left lat foot - EDC 01/01/21, recurrent 01/31/21 - MOHs 02/22/21   Squamous cell carcinoma of skin 12/19/2021   Right Posterior Medial Thigh, EDC   Squamous cell carcinoma of skin 12/19/2021   SCC IS, L lat heel, EDC 02/04/2022   Squamous cell carcinoma of skin 01/21/2022   L forearm, EDC 02/04/2022   Squamous cell carcinoma of skin 01/21/2022   R lower leg below knee, EDC 02/04/2022   Squamous cell carcinoma of skin 01/21/2022   SCCIS, R post heel, EDC 02/04/2022   Squamous cell carcinoma of skin 07/01/2022   left lower abdomen, in situ, EDC   Squamous cell carcinoma of skin 07/01/2022   left medial chest, EDC   Urge incontinence    Venous stasis of both lower extremities    Past Surgical History:  Procedure Laterality Date   ABDOMINAL AORTOGRAM W/LOWER EXTREMITY N/A 02/12/2021   Procedure: ABDOMINAL AORTOGRAM W/LOWER EXTREMITY;  Surgeon: Maeola Harman, MD;  Location: [facility] INVASIVE CV LAB;  Service: Cardiovascular;  Laterality: N/A;   ABDOMINAL HYSTERECTOMY  1975   for benign causes   APPENDECTOMY     BI-VENTRICULAR PACEMAKER UPGRADE  05/04/2010   BIOPSY  02/28/2019   Procedure: BIOPSY;  Surgeon: Rachael Fee, MD;  Location: [facility] ENDOSCOPY;  Service: Endoscopy;;   BIOPSY  03/04/2022   Procedure: BIOPSY;  Surgeon: Lemar Lofty., MD;  Location: [facility] ENDOSCOPY;  Service: Gastroenterology;;   CARDIAC CATHETERIZATION  2006   CARDIOVERSION N/A 02/20/2018   Procedure: CARDIOVERSION;  Surgeon: Antonieta Iba, MD;  Location: [facility] ORS;  Service: Cardiovascular;  Laterality: N/A;   CARDIOVERSION N/A 03/27/2018   Procedure: CARDIOVERSION;  Surgeon: Antonieta Iba, MD;  Location: [facility] ORS;  Service: Cardiovascular;  Laterality: N/A;   CATARACT EXTRACTION W/ INTRAOCULAR LENS  IMPLANT, BILATERAL  01/2006-02-2006   CHOLECYSTECTOMY N/A 11/11/2012   Procedure: LAPAROSCOPIC CHOLECYSTECTOMY WITH  INTRAOPERATIVE CHOLANGIOGRAM;  Surgeon: Wilmon Arms. Corliss Skains, MD;  [facility]: [facility] OR;  Service: General;  Laterality: N/A;   COLONOSCOPY WITH PROPOFOL N/A 02/28/2019   Procedure: COLONOSCOPY WITH PROPOFOL;  Surgeon: Rachael Fee, MD;  Location: [facility] ENDOSCOPY;  Service: Endoscopy;  Laterality: N/A;   ENTEROSCOPY N/A 03/30/2020   Procedure: ENTEROSCOPY;  Surgeon: Shellia Cleverly, DO;  Location: [facility] ENDOSCOPY;  Service: Gastroenterology;  Laterality: N/A;   ENTEROSCOPY N/A 02/10/2021   Procedure: ENTEROSCOPY;  Surgeon: Jeani Hawking, MD;  Location: [facility] ENDOSCOPY;  Service: Endoscopy;  Laterality: N/A;   ENTEROSCOPY N/A 02/15/2022   Procedure: ENTEROSCOPY;  Surgeon: Imogene Burn, MD;  Location: [facility] ENDOSCOPY;  Service: Gastroenterology;  Laterality: N/A;   ENTEROSCOPY N/A 11/06/2022   Procedure: ENTEROSCOPY;  Surgeon: Meridee Score Netty Starring., MD;  Location: [facility] ENDOSCOPY;  Service: Gastroenterology;  Laterality: N/A;   EP IMPLANTABLE DEVICE N/A 02/21/2016   Procedure: ICD  Musician;  Surgeon: Duke Salvia, MD;  Location: [facility] INVASIVE CV LAB;  Service: Cardiovascular;  Laterality: N/A;   ESOPHAGOGASTRODUODENOSCOPY N/A 03/04/2022   Procedure: ESOPHAGOGASTRODUODENOSCOPY (EGD);  Surgeon: Lemar Lofty., MD;  Location: [facility] ENDOSCOPY;  Service: Gastroenterology;  Laterality: N/A;   ESOPHAGOGASTRODUODENOSCOPY (EGD) WITH PROPOFOL N/A 02/28/2019   Procedure: ESOPHAGOGASTRODUODENOSCOPY (EGD) WITH PROPOFOL;  Surgeon: Rachael Fee, MD;  Location: [facility] ENDOSCOPY;  Service: Endoscopy;  Laterality: N/A;   EUS N/A 03/04/2022   Procedure: UPPER ENDOSCOPIC ULTRASOUND (EUS) RADIAL;  Surgeon: Lemar Lofty., MD;  Location: [facility] ENDOSCOPY;  Service: Gastroenterology;  Laterality: N/A;   FINE NEEDLE ASPIRATION N/A 03/04/2022   Procedure: FINE NEEDLE ASPIRATION (FNA) LINEAR;  Surgeon: Lemar Lofty., MD;  Location: [facility] ENDOSCOPY;  Service: Gastroenterology;  Laterality: N/A;   GIVENS CAPSULE  STUDY N/A 03/15/2019   Procedure: GIVENS CAPSULE STUDY;  Surgeon: Tressia Danas, MD;  Location: [facility] ENDOSCOPY;  Service: Gastroenterology;  Laterality: N/A;   HOT HEMOSTASIS N/A 03/30/2020   Procedure: HOT HEMOSTASIS (ARGON PLASMA COAGULATION/BICAP);  Surgeon: Shellia Cleverly, DO;  Location: [facility] ENDOSCOPY;  Service: Gastroenterology;  Laterality: N/A;   HOT HEMOSTASIS N/A 02/10/2021   Procedure: HOT HEMOSTASIS (ARGON PLASMA COAGULATION/BICAP);  Surgeon: Jeani Hawking, MD;  Location: [facility] ENDOSCOPY;  Service: Endoscopy;  Laterality: N/A;   HOT HEMOSTASIS N/A 02/15/2022   Procedure: HOT HEMOSTASIS (ARGON PLASMA COAGULATION/BICAP);  Surgeon: Imogene Burn, MD;  Location: [facility] ENDOSCOPY;  Service: Gastroenterology;  Laterality: N/A;   HOT HEMOSTASIS N/A 11/06/2022   Procedure: HOT HEMOSTASIS (ARGON PLASMA COAGULATION/BICAP);  Surgeon: Lemar Lofty., MD;  Location: [facility] ENDOSCOPY;  Service: Gastroenterology;  Laterality: N/A;   INSERT / REPLACE / REMOVE PACEMAKER  05-01-11   02-28-05-/05-04-10-ICD-MEDTRONIC MAXIMAL DR   IR ANGIOGRAM SELECTIVE EACH ADDITIONAL VESSEL  02/18/2022   IR ANGIOGRAM SELECTIVE EACH ADDITIONAL VESSEL  02/18/2022   IR ANGIOGRAM VISCERAL SELECTIVE  02/16/2022   IR ANGIOGRAM VISCERAL SELECTIVE  02/16/2022   IR EMBO ART  VEN HEMORR LYMPH EXTRAV  INC GUIDE ROADMAPPING  02/16/2022   IR THORACENTESIS ASP PLEURAL SPACE W/IMG GUIDE  02/21/2022   IR THORACENTESIS ASP PLEURAL SPACE W/IMG GUIDE  02/22/2022   IR US GUIDE VASC ACCESS RIGHT  02/16/2022   JOINT REPLACEMENT     LAPAROSCOPIC CHOLECYSTECTOMY  11/11/2012   LAPAROSCOPIC LYSIS OF ADHESIONS N/A 11/11/2012   Procedure: LAPAROSCOPIC LYSIS OF ADHESIONS;  Surgeon: Wilmon Arms. Corliss Skains, MD;  Location[facility] OR;  Service: General;  Laterality: N/A;   NEPHRECTOMY Right 06/2001    S/P RENAL CELL CANCER   PERIPHERAL VASCULAR INTERVENTION Bilateral 02/12/2021   Procedure: PERIPHERAL VASCULAR INTERVENTION;  Surgeon: Maeola Harman,  MD;  Location: [facility] INVASIVE CV LAB;  Service: Cardiovascular;  Laterality: Bilateral;  Iliac artery stents   PRESSURE SENSOR/CARDIOMEMS N/A 02/03/2019   Procedure: PRESSURE SENSOR/CARDIOMEMS;  Surgeon: Laurey Morale, MD;  Location: [facility] INVASIVE CV LAB;  Service: Cardiovascular;  Laterality: N/A;   RIGHT HEART CATH N/A 11/09/2018   Procedure: RIGHT HEART CATH;  Surgeon: Laurey Morale, MD;  Location: [facility] INVASIVE CV LAB;  Service: Cardiovascular;  Laterality: N/A;   RIGHT HEART CATH N/A 03/08/2019   Procedure: RIGHT HEART CATH;  Surgeon: Laurey Morale, MD;  Location: [facility] INVASIVE CV LAB;  Service: Cardiovascular;  Laterality: N/A;   SUBMUCOSAL TATTOO INJECTION  02/28/2019   Procedure: SUBMUCOSAL TATTOO INJECTION;  Surgeon: Rachael Fee, MD;  Location: [facility] ENDOSCOPY;  Service: Endoscopy;;   SUBMUCOSAL TATTOO INJECTION  11/06/2022  Procedure: SUBMUCOSAL TATTOO INJECTION;  Surgeon: Lemar Lofty., MD;  Location: [facility] ENDOSCOPY;  Service: Gastroenterology;;   TOTAL HIP ARTHROPLASTY Right 05/03/2011   Procedure: TOTAL HIP ARTHROPLASTY ANTERIOR APPROACH;  Surgeon: Kathryne Hitch;  Location: [facility] ORS;  Service: Orthopedics;  Laterality: Right;  Removal of Cannulated Screws Right Hip, Right Direct Anterior Hip Replacement   TOTAL HIP ARTHROPLASTY Left 10/09/2018   Procedure: TOTAL HIP ARTHROPLASTY ANTERIOR APPROACH;  Surgeon: Samson Frederic, MD;  Location: [facility] OR;  Service: Orthopedics;  Laterality: Left;     A IV Location/Drains/Wounds Patient Lines/Drains/Airways Status     Active Line/Drains/Airways     Name Placement date Placement time Site Days   Peripheral IV 01/16/23 18 G Left Antecubital 01/16/23  0118  Antecubital   less than 1   Peripheral IV 01/16/23 20 G 1" Posterior;Right Hand 01/16/23  0840  Hand  less than 1   Wound / Incision (Open or Dehisced) 02/16/22 Puncture Groin Right 02/16/22  2102  Groin  334   Wound / Incision (Open or Dehisced) 11/04/22 Non-pressure wound  Foot Right;Anterior recurrent SCC neoplasm right foot 11/04/22  2200  Foot  73   Wound / Incision (Open or Dehisced) 11/04/22 Non-pressure wound Foot Left;Lower Recurrent SCC neoplasm Left Lateral foot 11/04/22  2200  Foot  73            Intake/Output Last 24 hours  Intake/Output Summary (Last 24 hours) at 01/16/2023 1422 Last data filed at 01/16/2023 1215 Gross per 24 hour  Intake 289.58 ml  Output --  Net 289.58 ml    Labs/Imaging Results for orders placed or performed during the hospital encounter of 01/15/23 (from the past 48 hour(s))  Type and screen [facility]     Status: None (Preliminary result)   Collection Time: 01/15/23 11:17 PM  Result Value Ref Range   ABO/RH(D) A NEG    Antibody Screen POS    Sample Expiration 01/18/2023,2359    Antibody Identification ANTI FYA (Duffy a)    Unit Number [id number]    Blood Component Type RED CELLS,LR    Unit division 00    Status of Unit ISSUED    Donor AG Type      NEGATIVE FOR KELL ANTIGEN NEGATIVE FOR DUFFY A ANTIGEN NEGATIVE FOR KIDD B ANTIGEN   Transfusion Status OK TO TRANSFUSE    Crossmatch Result COMPATIBLE    Unit Number [id number]    Blood Component Type RED CELLS,LR    Unit division 00    Status of Unit ISSUED    Donor AG Type      NEGATIVE FOR KELL ANTIGEN NEGATIVE FOR DUFFY A ANTIGEN NEGATIVE FOR KIDD B ANTIGEN   Transfusion Status OK TO TRANSFUSE    Crossmatch Result COMPATIBLE   CBC with Differential     Status: Abnormal   Collection Time: 01/15/23 11:18 PM  Result Value Ref Range   WBC 3.9 (L) 4.0 - 10.5 K/uL   RBC 2.61 (L) 3.87 - 5.11 MIL/uL   Hemoglobin 6.8 (LL) 12.0 - 15.0 g/dL    Comment: REPEATED TO VERIFY THIS CRITICAL RESULT HAS VERIFIED AND BEEN CALLED TO E PRIOR RN BY JALEESA WHITE ON 09 11 2024 AT 2339, AND HAS BEEN READ BACK.  THIS CRITICAL RESULT HAS VERIFIED AND BEEN CALLED TO E PRIOR RN BY JALEESA WHITE ON 09 11 2024 AT 2339, AND HAS BEEN READ BACK.     HCT 24.2 (L)  36.0 - 46.0 %   MCV  92.7 80.0 - 100.0 fL   MCH 26.1 26.0 - 34.0 pg   MCHC 28.1 (L) 30.0 - 36.0 g/dL   RDW 63.0 (H) 16.0 - 10.9 %   Platelets 183 150 - 400 K/uL   nRBC 0.0 0.0 - 0.2 %   Neutrophils Relative % 82 %   Neutro Abs 3.2 1.7 - 7.7 K/uL   Lymphocytes Relative 8 %   Lymphs Abs 0.3 (L) 0.7 - 4.0 K/uL   Monocytes Relative 9 %   Monocytes Absolute 0.4 0.1 - 1.0 K/uL   Eosinophils Relative 0 %   Eosinophils Absolute 0.0 0.0 - 0.5 K/uL   Basophils Relative 1 %   Basophils Absolute 0.0 0.0 - 0.1 K/uL   Immature Granulocytes 0 %   Abs Immature Granulocytes 0.01 0.00 - 0.07 K/uL    Comment: Performed at [facility] Lab, [address], [place], [place] [address]  Basic metabolic panel     Status: Abnormal   Collection Time: 01/15/23 11:18 PM  Result Value Ref Range   Sodium 136 135 - 145 mmol/L   Potassium 4.4 3.5 - 5.1 mmol/L   Chloride 99 98 - 111 mmol/L   CO2 28 22 - 32 mmol/L   Glucose, Bld 191 (H) 70 - 99 mg/dL    Comment: Glucose reference range applies only to samples taken after fasting for at least 8 hours.   BUN 43 (H) 8 - 23 mg/dL   Creatinine, Ser 7.32 (H) 0.44 - 1.00 mg/dL   Calcium 8.7 (L) 8.9 - 10.3 mg/dL   GFR, Estimated 27 (L) >60 mL/min    Comment: (NOTE) Calculated using the CKD-EPI Creatinine Equation (2021)    Anion gap 9 5 - 15    Comment: Performed at [facility] Lab, [address] [place], [place] [address]  Brain natriuretic peptide     Status: Abnormal   Collection Time: 01/15/23 11:18 PM  Result Value Ref Range   B Natriuretic Peptide 342.9 (H) 0.0 - 100.0 pg/mL    Comment: Performed at [facility] Lab, [address], [place], [place] [address]  Prepare RBC (crossmatch)     Status: None   Collection Time: 01/16/23  3:05 AM  Result Value Ref Range   Order Confirmation      ORDER PROCESSED BY BLOOD BANK Performed at [facility] Lab, [address], [place], [place] [address]   Troponin I (High Sensitivity)     Status: Abnormal    Collection Time: 01/16/23  3:27 AM  Result Value Ref Range   Troponin I (High Sensitivity) 35 (H) <18 ng/L    Comment: (NOTE) Elevated high sensitivity troponin I (hsTnI) values and significant  changes across serial measurements may suggest ACS but many other  chronic and acute conditions are known to elevate hsTnI results.  Refer to the "Links" section for chest pain algorithms and additional  guidance. Performed at [facility] Lab, [address], [place], [place] [address]   Basic metabolic panel     Status: Abnormal   Collection Time: 01/16/23  3:27 AM  Result Value Ref Range   Sodium 137 135 - 145 mmol/L   Potassium 4.0 3.5 - 5.1 mmol/L   Chloride 97 (L) 98 - 111 mmol/L   CO2 29 22 - 32 mmol/L   Glucose, Bld 100 (H) 70 - 99 mg/dL    Comment: Glucose reference range applies only to samples taken after fasting for at least 8 hours.  BUN 42 (H) 8 - 23 mg/dL   Creatinine, Ser 2.83 (H) 0.44 - 1.00 mg/dL   Calcium 8.8 (L) 8.9 - 10.3 mg/dL   GFR, Estimated 29 (L) >60 mL/min    Comment: (NOTE) Calculated using the CKD-EPI Creatinine Equation (2021)    Anion gap 11 5 - 15    Comment: Performed at [facility] Lab, [address], [place], [place] [address]  Magnesium     Status: None   Collection Time: 01/16/23  3:27 AM  Result Value Ref Range   Magnesium 2.3 1.7 - 2.4 mg/dL    Comment: Performed at [facility] Lab, [address] [place], [place] [address]  Phosphorus     Status: None   Collection Time: 01/16/23  3:27 AM  Result Value Ref Range   Phosphorus 4.0 2.5 - 4.6 mg/dL    Comment: Performed at [facility] Lab, [address], [place], [place] [address]  Troponin I (High Sensitivity)     Status: Abnormal   Collection Time: 01/16/23  8:28 AM  Result Value Ref Range   Troponin I (High Sensitivity) 33 (H) <18 ng/L    Comment: (NOTE) Elevated high sensitivity troponin I (hsTnI) values and significant  changes across serial measurements may suggest ACS  but many other  chronic and acute conditions are known to elevate hsTnI results.  Refer to the "Links" section for chest pain algorithms and additional  guidance. Performed at [facility] Lab, [address], [place], [place] [address]   Vitamin B12     Status: None   Collection Time: 01/16/23  8:29 AM  Result Value Ref Range   Vitamin B-12 709 180 - 914 pg/mL    Comment: (NOTE) This assay is not validated for testing neonatal or myeloproliferative syndrome specimens for Vitamin B12 levels. Performed at [facility] Lab, [address], [place], [place] [address]   Folate     Status: None   Collection Time: 01/16/23  8:29 AM  Result Value Ref Range   Folate 32.5 >5.9 ng/mL    Comment: Performed at [facility] Lab, [address] [place], [place] [address]  Iron and TIBC     Status: None   Collection Time: 01/16/23  8:29 AM  Result Value Ref Range   Iron 36 28 - 170 ug/dL   TIBC 381 829 - 937 ug/dL   Saturation Ratios 11 10.4 - 31.8 %   UIBC 304 ug/dL    Comment: Performed at [facility] Lab, [address], [place], [place] [address]  Ferritin     Status: None   Collection Time: 01/16/23  8:29 AM  Result Value Ref Range   Ferritin 15 11 - 307 ng/mL    Comment: Performed at [facility] Lab, [address] [place], [place] [address]  Reticulocytes     Status: Abnormal   Collection Time: 01/16/23  8:29 AM  Result Value Ref Range   Retic Ct Pct 4.5 (H) 0.4 - 3.1 %   RBC. 2.57 (L) 3.87 - 5.11 MIL/uL   Retic Count, Absolute 114.9 19.0 - 186.0 K/uL   Immature Retic Fract 25.6 (H) 2.3 - 15.9 %    Comment: Performed at [facility] Lab, [address] [place], [place] [address]  CBG monitoring, ED     Status: None   Collection Time: 01/16/23 12:39 PM  Result Value Ref Range   Glucose-Capillary 99 70 - 99 mg/dL  Comment: Glucose reference range applies only to samples taken after fasting for at least 8 hours.   *Note: Due to a large number of results and/or  encounters for the requested time period, some results have not been displayed. A complete set of results can be found in Results Review.   DG Chest Port 1 View  Result Date: 01/16/2023 CLINICAL DATA:  Shortness of breath. EXAM: PORTABLE CHEST 1 VIEW COMPARISON:  December 09, 2022 FINDINGS: There is stable multilead AICD positioning. The heart size and mediastinal contours are within normal limits. There is marked severity calcification of the aortic arch. Mild, diffuse, chronic appearing increased interstitial lung markings are seen. Mild, stable atelectasis is seen within the bilateral lung bases with small, stable bilateral pleural effusions. No pneumothorax is identified. No acute osseous abnormalities are identified. IMPRESSION: Stable, chronic appearing increased interstitial lung markings with mild bibasilar atelectasis and small bilateral pleural effusions. Electronically Signed   By: Aram Candela M.D.   On: 01/16/2023 01:19    Pending Labs Unresulted Labs (From admission, onward)     Start     Ordered   01/17/23 0500  CBC  Tomorrow morning,   R        01/16/23 1036   01/17/23 0500  Basic metabolic panel  Tomorrow morning,   R        01/16/23 1036   01/17/23 0500  Magnesium  Tomorrow morning,   R        01/16/23 1036   01/17/23 0500  Brain natriuretic peptide  Tomorrow morning,   R        01/16/23 1036            Vitals/Pain Today's Vitals   01/16/23 1200 01/16/23 1215 01/16/23 1324 01/16/23 1342  BP:  (!) 146/48 (!) 154/52 (!) 127/58  Pulse:  64 62 62  Resp:  20 18 17   Temp:  98.4 F (36.9 C) 98.6 F (37 C) 99 F (37.2 C)  TempSrc:  Oral Oral Oral  SpO2:  100% 100% 100%  Weight:      Height:      PainSc: 0-No pain       Isolation Precautions No active isolations  Medications Medications  ipratropium-albuterol (DUONEB) 0.5-2.5 (3) MG/3ML nebulizer solution 3 mL (3 mLs Nebulization Given 01/16/23 0948)  albuterol (PROVENTIL) (2.5 MG/3ML) 0.083% nebulizer  solution 2.5 mg (has no administration in time range)  famotidine (PEPCID) IVPB 20 mg premix (0 mg Intravenous Stopped 01/16/23 0915)  methylPREDNISolone sodium succinate (SOLU-MEDROL) 125 mg/2 mL injection 125 mg (125 mg Intravenous Given 01/16/23 0845)  insulin aspart (novoLOG) injection 0-6 Units ( Subcutaneous Not Given 01/16/23 1242)  rosuvastatin (CRESTOR) tablet 40 mg (has no administration in time range)  pantoprazole (PROTONIX) EC tablet 40 mg (40 mg Oral Given 01/16/23 0932)  cyanocobalamin (VITAMIN B12) tablet (1,000 mcg Oral Given 01/16/23 0932)  ferrous gluconate (FERGON) tablet 324 mg (324 mg Oral Given 01/16/23 0949)  ascorbic acid (VITAMIN C) tablet 500 mg (500 mg Oral Given 01/16/23 0949)  torsemide (DEMADEX) tablet 100 mg (has no administration in time range)  furosemide (LASIX) 160 mg in dextrose 5 % 50 mL IVPB (0 mg Intravenous Stopped 01/16/23 0521)  0.9 %  sodium chloride infusion (Manually program via Guardrails IV Fluids) ( Intravenous New Bag/Given 01/16/23 0921)  diphenhydrAMINE (BENADRYL) capsule 50 mg (50 mg Oral Given 01/16/23 0845)  acetaminophen (TYLENOL) tablet 650 mg (650 mg Oral Given 01/16/23 0845)  0.9 %  sodium chloride infusion (  Manually program via Guardrails IV Fluids) ( Intravenous New Bag/Given 01/16/23 1251)  furosemide (LASIX) injection 40 mg (40 mg Intravenous Given 01/16/23 1249)    Mobility walks with person assist     Focused Assessments Cardiac Assessment Handoff:    Lab Results  Component Value Date   CKTOTAL 114 02/10/2012   CKMB 1.1 06/08/2010   TROPONINI 0.03 (HH) 05/12/2018   Lab Results  Component Value Date   DDIMER 16.06 (H) 10/07/2018   Does the Patient currently have chest pain? No    R Recommendations: See Admitting Provider Note  Report given to:   Additional Notes:  2nd unit of PRBC infusing now. A&O x 4. Uses BSC to void. Had echo done. On 2L oxygen at baseline.

## 2023-01-16 NOTE — Progress Notes (Signed)
Echocardiogram 2D Echocardiogram has been performed.  Warren Lacy Wenceslaus Gist RDCS 01/16/2023, 2:40 PM

## 2023-01-16 NOTE — ED Notes (Signed)
Shift report received, assumed care of patient at this time 

## 2023-01-16 NOTE — ED Notes (Signed)
Placed patient in hospital gown, clothes placed in belonging bag.

## 2023-01-16 NOTE — Progress Notes (Signed)
Patient was admitted to 3E14 from [facility] with second blood unit transfusing. Vitals and weight obtained. Patient does not have tele order per MD.

## 2023-01-16 NOTE — H&P (Signed)
History and Physical    Patient: [name] OZD:[id number] DOB: 1933-10-18 DOA: 01/15/2023 DOS: the patient was seen and examined on 01/16/2023 PCP: [name], MD  Patient coming from: Home  Chief Complaint:  Chief Complaint  Patient presents with   Weakness   HPI: [name] is a 89 y.o. female with multiple cormobidities, notably pancreatic neuroendocrine tumor, chronic anemia with antibodies and hx transfusion reactions, afib off of anticoagulation due to GI bleeding from AVMs, CAD, HFpEF, PPM/AICD, TIA, carotid stenosis, HTN, HLD, chronic hypoxic respiratory failure on 2 L, COPD, ILD changes on imaging, pulmonary HTN, CKD stage III, RCC with hx nephrectomy. Presents due to worsening dyspnea over past few weeks, and concern for symptomatic anemia by labs at oncologist office. Reports since blood transfusion approximately 2 weeks ago has had worsening symptoms of dyspnea. At baseline she is very limited (able to walk between rooms in house on flat ground, but sometimes needs to stop). Over past 2 weeks much worse and not able to tolerate any activity without dyspnea. Is on 2L around the clock, but wearing more frequently lately. Has associated increased lower ext swelling. Orthopnea worse in past few days, sleeping upright. She has been taking Torsemide 80 mg BID as usual, called cards office 2 days ago and advised to increase to 100 mg BID (took one dose of this on 9/11 AM). Was Rx'd Metolazone as well but had not taken this prior to her admission here. Longstanding hx of anemia, IDA, and requiring transfusions regularly outpatient. Complicated by Ab and transfusion reactions, requires pretreatment. Normally gets 2 units of blood if Hb < 7 per Oncologist note. Does note recent melena, although has stopped in past few days.    Review of Systems: As mentioned in the history of present illness. All other systems reviewed and are negative. Past Medical History:  Diagnosis Date    (HFpEF) heart failure with preserved ejection fraction (HCC)    EF=60-60%   Actinic keratosis 01/17/2015   R forearm   Adjustment disorder with anxiety    Adverse effect of other narcotics, sequela    Intolerance to all narcotics   Anti-Duffy antibodies present    Aortic valve stenosis    Arthritis    "some in my hands" (11/11/2012)   Atrial fibrillation, permanent (HCC)    Eliquis   Atypical mole 03/25/2018   L forearm - severe   Automatic implantable cardioverter-defibrillator in situ    Avascular necrosis of hip (HCC) 05/03/2011   Carotid artery stenosis 09/2007   a. 09/2007: 60-79% bilateral (stable); b. 10/2008: 40-59% R 60-79%    Cellulitis of left lower extremity 07/13/2020   CKD (chronic kidney disease), stage III (HCC)    COPD (chronic obstructive pulmonary disease) (HCC)    Coronary artery disease    non-obstructive by 2006 cath   COVID-19 virus infection 02/12/2022   Displaced fracture of left femoral neck (HCC) 10/09/2018   GI bleed 03/28/2020   AVM   High cholesterol    Hypertension 05/20/2011   Hypertr obst cardiomyop    Hypotension, unspecified    cardiac cath 2006..nonobstructive CAD 30-40s lesions.Marland KitchenETT 1/09 nondiagnostic due to poor HR response..Right Renal Cancer 2003   Iron deficiency anemia    Long term (current) use of anticoagulants    On home oxygen therapy    3L [place]   Osteoarthritis of right hip    PAD (peripheral artery disease) (HCC)    Pancreatic cancer (HCC)    sandostatin   PONV (  postoperative nausea and vomiting)    Presence of permanent cardiac pacemaker    Pulmonary HTN (HCC)    RECTAL BLEEDING 10/13/2009   Qualifier: Diagnosis of  By: Wilmon Pali NP, Paula     Red blood cell antibody positive with compatible PRBC difficult to obtain    Anti FYA (Duffy a) antibody. Must be transfused with PRB which are Duffy Antigen Negative and Crossmatch Compatible   Renal cell carcinoma (HCC)    s/p nephrectomy   Squamous cell carcinoma of skin  01/17/2015   R lat wrist   Squamous cell carcinoma of skin 01/29/2018   R post upper leg - superficially invasive   Squamous cell carcinoma of skin 03/25/2018   L lat foot   Squamous cell carcinoma of skin 11/09/2020   left lat foot - EDC 01/01/21, recurrent 01/31/21 - MOHs 02/22/21   Squamous cell carcinoma of skin 12/19/2021   Right Posterior Medial Thigh, EDC   Squamous cell carcinoma of skin 12/19/2021   SCC IS, L lat heel, EDC 02/04/2022   Squamous cell carcinoma of skin 01/21/2022   L forearm, EDC 02/04/2022   Squamous cell carcinoma of skin 01/21/2022   R lower leg below knee, EDC 02/04/2022   Squamous cell carcinoma of skin 01/21/2022   SCCIS, R post heel, EDC 02/04/2022   Squamous cell carcinoma of skin 07/01/2022   left lower abdomen, in situ, EDC   Squamous cell carcinoma of skin 07/01/2022   left medial chest, EDC   Urge incontinence    Venous stasis of both lower extremities    Past Surgical History:  Procedure Laterality Date   ABDOMINAL AORTOGRAM W/LOWER EXTREMITY N/A 02/12/2021   Procedure: ABDOMINAL AORTOGRAM W/LOWER EXTREMITY;  Surgeon: Maeola Harman, MD;  Location: [facility] INVASIVE CV LAB;  Service: Cardiovascular;  Laterality: N/A;   ABDOMINAL HYSTERECTOMY  1975   for benign causes   APPENDECTOMY     BI-VENTRICULAR PACEMAKER UPGRADE  05/04/2010   BIOPSY  02/28/2019   Procedure: BIOPSY;  Surgeon: Rachael Fee, MD;  Location: [facility] ENDOSCOPY;  Service: Endoscopy;;   BIOPSY  03/04/2022   Procedure: BIOPSY;  Surgeon: Lemar Lofty., MD;  Location: [facility] ENDOSCOPY;  Service: Gastroenterology;;   CARDIAC CATHETERIZATION  2006   CARDIOVERSION N/A 02/20/2018   Procedure: CARDIOVERSION;  Surgeon: Antonieta Iba, MD;  Location: [facility] ORS;  Service: Cardiovascular;  Laterality: N/A;   CARDIOVERSION N/A 03/27/2018   Procedure: CARDIOVERSION;  Surgeon: Antonieta Iba, MD;  Location: [facility] ORS;  Service: Cardiovascular;  Laterality: N/A;   CATARACT  EXTRACTION W/ INTRAOCULAR LENS  IMPLANT, BILATERAL  01/2006-02-2006   CHOLECYSTECTOMY N/A 11/11/2012   Procedure: LAPAROSCOPIC CHOLECYSTECTOMY WITH INTRAOPERATIVE CHOLANGIOGRAM;  Surgeon: Wilmon Arms. Corliss Skains, MD;  Location: [facility] OR;  Service: General;  Laterality: N/A;   COLONOSCOPY WITH PROPOFOL N/A 02/28/2019   Procedure: COLONOSCOPY WITH PROPOFOL;  Surgeon: Rachael Fee, MD;  Location: [facility] ENDOSCOPY;  Service: Endoscopy;  Laterality: N/A;   ENTEROSCOPY N/A 03/30/2020   Procedure: ENTEROSCOPY;  Surgeon: Shellia Cleverly, DO;  Location: [facility] ENDOSCOPY;  Service: Gastroenterology;  Laterality: N/A;   ENTEROSCOPY N/A 02/10/2021   Procedure: ENTEROSCOPY;  Surgeon: Jeani Hawking, MD;  Location: [facility] ENDOSCOPY;  Service: Endoscopy;  Laterality: N/A;   ENTEROSCOPY N/A 02/15/2022   Procedure: ENTEROSCOPY;  Surgeon: Imogene Burn, MD;  Location: [facility] ENDOSCOPY;  Service: Gastroenterology;  Laterality: N/A;   ENTEROSCOPY N/A 11/06/2022   Procedure: ENTEROSCOPY;  Surgeon: Meridee Score Netty Starring., MD;  Location: [facility] ENDOSCOPY;  Service:  Gastroenterology;  Laterality: N/A;   EP IMPLANTABLE DEVICE N/A 02/21/2016   Procedure: ICD Generator Changeout;  Surgeon: Duke Salvia, MD;  Location: [facility] INVASIVE CV LAB;  Service: Cardiovascular;  Laterality: N/A;   ESOPHAGOGASTRODUODENOSCOPY N/A 03/04/2022   Procedure: ESOPHAGOGASTRODUODENOSCOPY (EGD);  Surgeon: Lemar Lofty., MD;  Location: [facility] ENDOSCOPY;  Service: Gastroenterology;  Laterality: N/A;   ESOPHAGOGASTRODUODENOSCOPY (EGD) WITH PROPOFOL N/A 02/28/2019   Procedure: ESOPHAGOGASTRODUODENOSCOPY (EGD) WITH PROPOFOL;  Surgeon: Rachael Fee, MD;  Location: [facility] ENDOSCOPY;  Service: Endoscopy;  Laterality: N/A;   EUS N/A 03/04/2022   Procedure: UPPER ENDOSCOPIC ULTRASOUND (EUS) RADIAL;  Surgeon: Lemar Lofty., MD;  Location: [facility] ENDOSCOPY;  Service: Gastroenterology;  Laterality: N/A;   FINE NEEDLE ASPIRATION N/A 03/04/2022   Procedure: FINE NEEDLE  ASPIRATION (FNA) LINEAR;  Surgeon: Lemar Lofty., MD;  Location: [facility] ENDOSCOPY;  Service: Gastroenterology;  Laterality: N/A;   GIVENS CAPSULE STUDY N/A 03/15/2019   Procedure: GIVENS CAPSULE STUDY;  Surgeon: Tressia Danas, MD;  Location: [facility] ENDOSCOPY;  Service: Gastroenterology;  Laterality: N/A;   HOT HEMOSTASIS N/A 03/30/2020   Procedure: HOT HEMOSTASIS (ARGON PLASMA COAGULATION/BICAP);  Surgeon: Shellia Cleverly, DO;  Location: [facility] ENDOSCOPY;  Service: Gastroenterology;  Laterality: N/A;   HOT HEMOSTASIS N/A 02/10/2021   Procedure: HOT HEMOSTASIS (ARGON PLASMA COAGULATION/BICAP);  Surgeon: Jeani Hawking, MD;  Location: [facility] ENDOSCOPY;  Service: Endoscopy;  Laterality: N/A;   HOT HEMOSTASIS N/A 02/15/2022   Procedure: HOT HEMOSTASIS (ARGON PLASMA COAGULATION/BICAP);  Surgeon: Imogene Burn, MD;  Location: [facility] ENDOSCOPY;  Service: Gastroenterology;  Laterality: N/A;   HOT HEMOSTASIS N/A 11/06/2022   Procedure: HOT HEMOSTASIS (ARGON PLASMA COAGULATION/BICAP);  Surgeon: Lemar Lofty., MD;  Location: [facility] ENDOSCOPY;  Service: Gastroenterology;  Laterality: N/A;   INSERT / REPLACE / REMOVE PACEMAKER  05-01-11   02-28-05-/05-04-10-ICD-MEDTRONIC MAXIMAL DR   IR ANGIOGRAM SELECTIVE EACH ADDITIONAL VESSEL  02/18/2022   IR ANGIOGRAM SELECTIVE EACH ADDITIONAL VESSEL  02/18/2022   IR ANGIOGRAM VISCERAL SELECTIVE  02/16/2022   IR ANGIOGRAM VISCERAL SELECTIVE  02/16/2022   IR EMBO ART  VEN HEMORR LYMPH EXTRAV  INC GUIDE ROADMAPPING  02/16/2022   IR THORACENTESIS ASP PLEURAL SPACE W/IMG GUIDE  02/21/2022   IR THORACENTESIS ASP PLEURAL SPACE W/IMG GUIDE  02/22/2022   IR US GUIDE VASC ACCESS RIGHT  02/16/2022   JOINT REPLACEMENT     LAPAROSCOPIC CHOLECYSTECTOMY  11/11/2012   LAPAROSCOPIC LYSIS OF ADHESIONS N/A 11/11/2012   Procedure: LAPAROSCOPIC LYSIS OF ADHESIONS;  Surgeon: Wilmon Arms. Corliss Skains, MD;  Location: [facility] OR;  Service: General;  Laterality: N/A;   NEPHRECTOMY Right 06/2001    S/P RENAL  CELL CANCER   PERIPHERAL VASCULAR INTERVENTION Bilateral 02/12/2021   Procedure: PERIPHERAL VASCULAR INTERVENTION;  Surgeon: Maeola Harman, MD;  Location: [facility] INVASIVE CV LAB;  Service: Cardiovascular;  Laterality: Bilateral;  Iliac artery stents   PRESSURE SENSOR/CARDIOMEMS N/A 02/03/2019   Procedure: PRESSURE SENSOR/CARDIOMEMS;  Surgeon: Laurey Morale, MD;  Location: [facility] INVASIVE CV LAB;  Service: Cardiovascular;  Laterality: N/A;   RIGHT HEART CATH N/A 11/09/2018   Procedure: RIGHT HEART CATH;  Surgeon: Laurey Morale, MD;  Location: [facility] INVASIVE CV LAB;  Service: Cardiovascular;  Laterality: N/A;   RIGHT HEART CATH N/A 03/08/2019   Procedure: RIGHT HEART CATH;  Surgeon: Laurey Morale, MD;  Location: [facility] INVASIVE CV LAB;  Service: Cardiovascular;  Laterality: N/A;   SUBMUCOSAL TATTOO INJECTION  02/28/2019   Procedure: SUBMUCOSAL TATTOO INJECTION;  Surgeon: Rachael Fee, MD;  Location: [facility] ENDOSCOPY;  Service: Endoscopy;;   SUBMUCOSAL TATTOO INJECTION  11/06/2022   Procedure: SUBMUCOSAL TATTOO INJECTION;  Surgeon: Lemar Lofty., MD;  Location: [facility] ENDOSCOPY;  Service: Gastroenterology;;   TOTAL HIP ARTHROPLASTY Right 05/03/2011   Procedure: TOTAL HIP ARTHROPLASTY ANTERIOR APPROACH;  Surgeon: Kathryne Hitch;  Location: [facility] ORS;  Service: Orthopedics;  Laterality: Right;  Removal of Cannulated Screws Right Hip, Right Direct Anterior Hip Replacement   TOTAL HIP ARTHROPLASTY Left 10/09/2018   Procedure: TOTAL HIP ARTHROPLASTY ANTERIOR APPROACH;  Surgeon: Samson Frederic, MD;  Location: [facility] OR;  Service: Orthopedics;  Laterality: Left;   Social History:  reports that she quit smoking about 21 years ago. Her smoking use included cigarettes. She started smoking about 61 years ago. She has a 20 pack-year smoking history. She has never used smokeless tobacco. She reports that she does not drink alcohol and does not use drugs.  Allergies  Allergen Reactions   Codeine Nausea  And Vomiting    Hallucinations    Morphine And Codeine Nausea And Vomiting    Hallucinations    Amoxicillin Nausea Only    Family History  Problem Relation Age of Onset   Heart failure Mother    Early death Father        car accident   Breast cancer Maternal Aunt 67   Breast cancer Cousin    Breast cancer Other    Colon cancer Neg Hx    Esophageal cancer Neg Hx    Liver disease Neg Hx    Pancreatic cancer Neg Hx    Rectal cancer Neg Hx     Prior to Admission medications   Medication Sig Start Date End Date Taking? Authorizing Provider  acetaminophen (TYLENOL) 500 MG tablet Take 1,000 mg by mouth every 8 (eight) hours as needed for mild pain or headache.   Yes [provider]  Cyanocobalamin (B-12 PO) Take 1 tablet by mouth daily.   Yes [provider]  ferrous gluconate (FERGON) 324 MG tablet Take 1 tablet (324 mg total) by mouth daily with breakfast. 11/15/22  Yes Rai, Ripudeep K, MD  fluticasone (FLONASE) 50 MCG/ACT nasal spray Place 2 sprays into both nostrils daily as needed for allergies or rhinitis. 07/04/22  Yes [name], MD  pantoprazole (PROTONIX) 40 MG tablet Take 1 tablet (40 mg total) by mouth daily. 11/09/22  Yes Rai, Ripudeep K, MD  potassium chloride SA (KLOR-CON M) 20 MEQ tablet Take 1 tablet (20 mEq total) by mouth 2 (two) times daily. Patient taking differently: Take 20 mEq by mouth 2 (two) times daily. 40 meq in am and 20 meq 11/09/22  Yes Rai, Ripudeep K, MD  rosuvastatin (CRESTOR) 40 MG tablet Take 1 tablet (40 mg total) by mouth daily. Patient taking differently: Take 40 mg by mouth every evening. 05/02/22  Yes [name], MD  torsemide (DEMADEX) 20 MG tablet Take 4 tablets (80 mg total) by mouth 2 (two) times daily. Patient taking differently: Take 1,000 mg by mouth 2 (two) times daily. 08/21/22  Yes Milford, Anderson Malta, FNP  vitamin C (ASCORBIC ACID) 250 MG tablet Take 500 mg by mouth daily.   Yes [provider]  VITAMIN  D PO Take 4,000 Units by mouth daily.   Yes [provider]  doxycycline (VIBRA-TABS) 100 MG tablet Take 1 tablet (100 mg total) by mouth 2 (two) times daily. Patient not taking: Reported on 01/16/2023 12/09/22   [name], MD  metolazone (ZAROXOLYN) 2.5 MG tablet Take  1 tablet (2.5 mg total) by mouth daily for 1 day. Take additional Potassium 20 mEq 2 tablets (40 mEq total) with the Metolazone dose. 01/14/23 01/15/23  Jacklynn Ganong, FNP    Physical Exam: Vitals:   01/16/23 0115 01/16/23 0220 01/16/23 0300 01/16/23 0334  BP:  (!) 131/40 (!) 136/55 (!) 134/56  Pulse: 60  60 (!) 59  Resp: 19  19 20   Temp:    98 F (36.7 C)  TempSrc:    Oral  SpO2: 100%  100% 100%     Gen: Awake, alert, NAD, chronically ill appearing  Neck: JVD to approx 15 cm while almost upright  CV: Regular, normal S1, S2, 3/6 holosystolic murmur  Resp: Normal WOB, on , there are rales to 2/3 the posterior fields  Abd: Flat, normoactive, nontender MSK: Symmetric, there is 3+ pitting edema which extends to mid thigh and presacrally.  Skin: Scatterred ecchymoses, erythematous changes associated with bilateral edema. Otherwise no rashes or lesions.  Neuro: Alert and interactive  Psych: euthymic, appropriate    Data Reviewed:   Reviewed, notable for   Cr 1.8 around baseline  BG 191  WBC 3.9  Hb 6.8  PLT 183  BNP 342   Imaging reviewed  CXR with chronic interstitial markings, small bibasilar atelectasis and small effusions   Assessment and Plan:  89 y/o F with hx with multiple cormobidities, notably pancreatic neuroendocrine tumor, chronic anemia with antibodies and hx transfusion reactions, afib off of anticoagulation due to GI bleeding from AVMs, CAD, HFpEF, PPM/AICD, TIA, carotid stenosis, HTN, HLD, chronic hypoxic respiratory failure on 2 L, COPD, ILD changes on imaging, pulmonary HTN, CKD stage III, RCC with hx nephrectomy. Presents with worsening dyspnea on exertion in setting of  suspected HFpEF exacerbation and symptomatic anemia.   Dyspnea on exertion  Chronic hypoxic respiratory failure on 2L  Acute exacerbation, heart failure with preserved ejection fraction  Symptomatic anemia, acute on chronic Multifactorial presentation, dyspneic with predominant HF symptoms reported. BNP 342, trop pending, EKG afib. CXR with more chronic interstitial changes, no overt pulmonary edema. Etiology of HF exacerbation unclear, possibly recurrent demand events with anemia. Otherwise with chronic anemia, Hb 6.8 on admission. Anemia longstanding in setting of likely chronic GI blood loss, iron deficiency.  - Lasix 160 mg IV x1, redose as needed with fluid goal -1.5 - 2 L negative  - Consider dose of Metolazone in AM pending response  - Repeat TTE  - For anemia, 2 unit PRBC ordered for transfusion, note hx of antibiotics and transfusion reactions in past  - Pretreatment for transfusions: Tylenol 975 mg PO, Benadryl 50 mg PO, Pepcid 20 mg IV, and Methylprednisolone 125 mg IV x 1 prior to transfusions ordered prn  - Check Ferritin  - Duoneb q6 hr, albuterol q2 hr prn, OOB as able, provide with incentive spirometer  - Will need walk test prior to discharge, PT evaluation ordered   Deconditioning  - PT eval and treat   Hyperglycemia  - SSI   Chronic medical problems:   pancreatic neuroendocrine tumor:  - Follows with Dr. Mosetta Putt, oncology outpatient. 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 Not on controller inhalers.  - Walk test before discharge for O2 requirements   CKD stage  III  RCC with hx nephrectomy  Cr near baseline 1.5    Advance Care Planning:   Code Status: Limited: Do not attempt resuscitation (DNR) -DNR-LIMITED -Do Not Intubate/DNI  ; Confirmed with patient   Consults: None   Family Communication: No   Severity of Illness: The appropriate patient status for this patient is INPATIENT. Inpatient status is judged to be reasonable and necessary in order to provide the required intensity of service to ensure the patient's safety. The patient's presenting symptoms, physical exam findings, and initial radiographic and laboratory data in the context of their chronic comorbidities is felt to place them at high risk for further clinical deterioration. Furthermore, it is not anticipated that the patient will be medically stable for discharge from the hospital within 2 midnights of admission.   * I certify that at the point of admission it is my clinical judgment that the patient will require inpatient hospital care spanning beyond 2 midnights from the point of admission due to high intensity of service, high risk for further deterioration and high frequency of surveillance required.*  Author: Dolly Rias, MD 01/16/2023 4:13 AM  For on call review [email or web address].

## 2023-01-16 NOTE — ED Notes (Signed)
Up to bedside commode to void. No complaints. Blood infusing with no reaction noted thus far.

## 2023-01-16 NOTE — ED Notes (Signed)
PT in to evaluate. 

## 2023-01-16 NOTE — Evaluation (Signed)
Physical Therapy Evaluation Patient Details Name: [name] MRN: [id number] DOB: 01-05-1934 Today's Date: 01/16/2023  History of Present Illness  Patient is a 89 year old female with dyspnea with exertion, chronic hypoxic respiratory failure on 2L, acute exacerbation of CHF and symptomatic anemia. History of pancreatic cancer, anemia, transfusion reactions, GI bleed, nephrectomy, CKD, HTN, COPD, chronic respiratory failure   Clinical Impression  Patient is agreeable to PT evaluation. Patient seated up in chair and requesting to get to the bed side commode on arrival to room. She has used a cane for the past 2 weeks for generalized weakness but is typically independent without assistive device. Her great grandson lives with her but works 24 hour shifts.  Vitals stable throughout session. Patient able to perform 2 stand step transfers, to and from bed side commode with contact guard assistance. She reports feeling continued generalized weakness that has improved significantly from yesterday. Mild dyspnea with exertion with Sp02 94% on 2 L02. Patient is eager to walk when feeling better. Recommend PT follow up to maximize independence and decrease caregiver burden. Patient wants to return home at discharge.        If plan is discharge home, recommend the following: Assist for transportation;Help with stairs or ramp for entrance   Can travel by private vehicle        Equipment Recommendations None recommended by PT  Recommendations for Other Services       Functional Status Assessment Patient has had a recent decline in their functional status and demonstrates the ability to make significant improvements in function in a reasonable and predictable amount of time.     Precautions / Restrictions Precautions Precautions: Fall Restrictions Weight Bearing Restrictions: No      Mobility  Bed Mobility               General bed mobility comments: not observed as patient  sitting up on arrival to room and post session    Transfers Overall transfer level: Needs assistance Equipment used: None Transfers: Bed to chair/wheelchair/BSC     Step pivot transfers: Contact guard assist       General transfer comment: patient transferred from chair to bed side commode and back to chair with CGA for safety.    Ambulation/Gait               General Gait Details: ambulation deferred due to fatigue with standing activity, mild dyspnea with exertion, patient still getting blood transfusion at this time  Stairs            Wheelchair Mobility     Tilt Bed    Modified Rankin (Stroke Patients Only)       Balance Overall balance assessment: Needs assistance Sitting-balance support: Feet supported Sitting balance-Leahy Scale: Good     Standing balance support: No upper extremity supported Standing balance-Leahy Scale: Fair                               Pertinent Vitals/Pain Pain Assessment Pain Assessment: No/denies pain    Home Living Family/patient expects to be discharged to:: Private residence Living Arrangements: Other relatives (great grandson)   Type of Home: House Home Access: Stairs to enter Entrance Stairs-Rails: Right;Left;Can reach both Entrance Stairs-Number of Steps: 2   Home Layout: One level Home Equipment: Pharmacist, hospital (2 wheels);Cane - single point;Grab bars - tub/shower;BSC/3in1 Additional Comments: great grandson works 24 hours shifts but is off several  days in a row. she has other family that checks in also    Prior Function Prior Level of Function : Independent/Modified Independent             Mobility Comments: using a cane over the past 2 weeks, otherwise is independent and on 2 L02 ADLs Comments: independent     Extremity/Trunk Assessment   Upper Extremity Assessment Upper Extremity Assessment: Generalized weakness    Lower Extremity Assessment Lower Extremity  Assessment: Generalized weakness (edema noted bilaterally)       Communication   Communication Communication: No apparent difficulties  Cognition Arousal: Alert Behavior During Therapy: WFL for tasks assessed/performed Overall Cognitive Status: Within Functional Limits for tasks assessed                                          General Comments General comments (skin integrity, edema, etc.): patient is able to complete all toileting tasks with supervision. vitals stable throughout session. patient reports feeling generalized weakness but that this has improved significantly since yesterday. encouraged patient to elevated LE while seated for edema management but she prefers to keep legs in dependent position. vitals monitored throughout session    Exercises     Assessment/Plan    PT Assessment Patient needs continued PT services  PT Problem List Decreased strength;Decreased activity tolerance;Decreased balance;Decreased mobility       PT Treatment Interventions DME instruction;Gait training;Stair training;Functional mobility training;Therapeutic activities;Balance training;Therapeutic exercise;Neuromuscular re-education;Patient/family education    PT Goals (Current goals can be found in the Care Plan section)  Acute Rehab PT Goals Patient Stated Goal: to walk and for decreased edema PT Goal Formulation: With patient Time For Goal Achievement: 01/23/23 Potential to Achieve Goals: Good    Frequency Min 1X/week     Co-evaluation               AM-PAC PT "6 Clicks" Mobility  Outcome Measure Help needed turning from your back to your side while in a flat bed without using bedrails?: None Help needed moving from lying on your back to sitting on the side of a flat bed without using bedrails?: A Little Help needed moving to and from a bed to a chair (including a wheelchair)?: A Little Help needed standing up from a chair using your arms (e.g., wheelchair or  bedside chair)?: A Little Help needed to walk in hospital room?: A Little Help needed climbing 3-5 steps with a railing? : A Little 6 Click Score: 19    End of Session Equipment Utilized During Treatment: Oxygen Activity Tolerance: Patient tolerated treatment well;Patient limited by fatigue Patient left: in chair;with call bell/phone within reach Nurse Communication: Mobility status PT Visit Diagnosis: Unsteadiness on feet (R26.81);Muscle weakness (generalized) (M62.81)    Time: 7846-9629 PT Time Calculation (min) (ACUTE ONLY): 20 min   Charges:   PT Evaluation $PT Eval Moderate Complexity: 1 Mod   PT General Charges $$ ACUTE PT VISIT: 1 Visit         Donna Bernard, PT, MPT   Ina Homes 01/16/2023, 12:15 PM

## 2023-01-16 NOTE — ED Provider Notes (Signed)
[place] EMERGENCY DEPARTMENT AT [facility] Provider Note   CSN: [id number] Arrival date & time: 01/15/23  2251     History  No chief complaint on file.   [name] is a 89 y.o. female.  Patient presents to the emergency department at the counsel of her oncology office.  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  Patient has chronic anemia and intermittently requires IV iron infusions as well as frequent blood transfusions.  Over the last 2 weeks she has had progressively worsening shortness of breath.  Patient with generalized weakness, dizziness with standing.  She has had some intermittent melanotic stools, no hematemesis or gross blood per rectum.  Patient also complaining of swelling of her legs.  She reports that she normally gets Lasix with her blood transfusions but did not get any IV Lasix the last time she was transfused.  Her last transfusion was 2 weeks ago.       Home Medications Prior to Admission medications   Medication Sig Start Date End Date Taking? Authorizing Provider  acetaminophen (TYLENOL) 500 MG tablet Take 1,000 mg by mouth every 8 (eight) hours as needed for mild pain or headache.    [provider]  Cyanocobalamin (B-12 PO) Take 1 tablet by mouth daily.    [provider]  doxycycline (VIBRA-TABS) 100 MG tablet Take 1 tablet (100 mg total) by mouth 2 (two) times daily. 12/09/22   [name], MD  ferrous gluconate (FERGON) 324 MG tablet Take 1 tablet (324 mg total) by mouth daily with breakfast. 11/15/22   Rai, Ripudeep K, MD  fluticasone (FLONASE) 50 MCG/ACT nasal spray Place 2 sprays into both nostrils daily as needed for allergies or rhinitis. 07/04/22   [name], MD  metolazone (ZAROXOLYN) 2.5 MG tablet Take 1 tablet (2.5 mg total) by mouth daily for 1 day. Take additional Potassium 20 mEq 2 tablets (40 mEq total) with the Metolazone dose. 01/14/23  01/15/23  Jacklynn Ganong, FNP  pantoprazole (PROTONIX) 40 MG tablet Take 1 tablet (40 mg total) by mouth daily. 11/09/22   Rai, Ripudeep K, MD  potassium chloride SA (KLOR-CON M) 20 MEQ tablet Take 1 tablet (20 mEq total) by mouth 2 (two) times daily. Patient taking differently: Take 20 mEq by mouth 3 (three) times daily. 11/09/22   Rai, Ripudeep K, MD  rosuvastatin (CRESTOR) 40 MG tablet Take 1 tablet (40 mg total) by mouth daily. Patient taking differently: Take 40 mg by mouth every evening. 05/02/22   [name], MD  torsemide (DEMADEX) 20 MG tablet Take 4 tablets (80 mg total) by mouth 2 (two) times daily. 08/21/22   [place], Anderson Malta, FNP  vitamin C (ASCORBIC ACID) 250 MG tablet Take 500 mg by mouth daily.    [provider]  VITAMIN D PO Take 4,000 Units by mouth daily.    [provider]      Allergies    Codeine, Morphine and codeine, and Amoxicillin    Review of Systems   Review of Systems  Physical Exam Updated Vital Signs BP 121/87   Pulse 60   Temp 97.9 F (36.6 C) (Oral)   Resp 19   SpO2 100%  Physical Exam Vitals and nursing note reviewed.  Constitutional:      General: She is not in acute distress.    Appearance: She is well-developed.  HENT:     Head: Normocephalic and atraumatic.  Mouth/Throat:     Mouth: Mucous membranes are moist.  Eyes:     General: Vision grossly intact. Gaze aligned appropriately.     Extraocular Movements: Extraocular movements intact.     Conjunctiva/sclera: Conjunctivae normal.  Cardiovascular:     Rate and Rhythm: Normal rate and regular rhythm.     Pulses: Normal pulses.     Heart sounds: Normal heart sounds, S1 normal and S2 normal. No murmur heard.    No friction rub. No gallop.  Pulmonary:     Effort: Pulmonary effort is normal. No respiratory distress.     Breath sounds: Normal breath sounds.  Abdominal:     General: Bowel sounds are normal.     Palpations: Abdomen is soft.     Tenderness:  There is no abdominal tenderness. There is no guarding or rebound.     Hernia: No hernia is present.  Musculoskeletal:        General: No swelling.     Cervical back: Full passive range of motion without pain, normal range of motion and neck supple. No spinous process tenderness or muscular tenderness. Normal range of motion.     Right lower leg: Edema present.     Left lower leg: Edema present.  Skin:    General: Skin is warm and dry.     Capillary Refill: Capillary refill takes less than 2 seconds.     Findings: No ecchymosis, erythema, rash or wound.  Neurological:     General: No focal deficit present.     Mental Status: She is alert and oriented to person, place, and time.     GCS: GCS eye subscore is 4. GCS verbal subscore is 5. GCS motor subscore is 6.     Cranial Nerves: Cranial nerves 2-12 are intact.     Sensory: Sensation is intact.     Motor: Motor function is intact.     Coordination: Coordination is intact.  Psychiatric:        Attention and Perception: Attention normal.        Mood and Affect: Mood normal.        Speech: Speech normal.        Behavior: Behavior normal.     ED Results / Procedures / Treatments   Labs (all labs ordered are listed, but only abnormal results are displayed) Labs Reviewed  CBC WITH DIFFERENTIAL/PLATELET - Abnormal; Notable for the following components:      Result Value   WBC 3.9 (*)    RBC 2.61 (*)    Hemoglobin 6.8 (*)    HCT 24.2 (*)    MCHC 28.1 (*)    RDW 18.0 (*)    Lymphs Abs 0.3 (*)    All other components within normal limits  BASIC METABOLIC PANEL - Abnormal; Notable for the following components:   Glucose, Bld 191 (*)    BUN 43 (*)    Creatinine, Ser 1.81 (*)    Calcium 8.7 (*)    GFR, Estimated 27 (*)    All other components within normal limits  BRAIN NATRIURETIC PEPTIDE  TYPE AND SCREEN    EKG None Ventricular paced rhythm at 60 BPM  Radiology DG Chest Port 1 View  Result Date: 01/16/2023 CLINICAL  DATA:  Shortness of breath. EXAM: PORTABLE CHEST 1 VIEW COMPARISON:  December 09, 2022 FINDINGS: There is stable multilead AICD positioning. The heart size and mediastinal contours are within normal limits. There is marked severity calcification of the aortic arch. Mild, diffuse,  chronic appearing increased interstitial lung markings are seen. Mild, stable atelectasis is seen within the bilateral lung bases with small, stable bilateral pleural effusions. No pneumothorax is identified. No acute osseous abnormalities are identified. IMPRESSION: Stable, chronic appearing increased interstitial lung markings with mild bibasilar atelectasis and small bilateral pleural effusions. Electronically Signed   By: Aram Candela M.D.   On: 01/16/2023 01:19    Procedures Procedures    Medications Ordered in ED Medications  furosemide (LASIX) injection 80 mg (has no administration in time range)    ED Course/ Medical Decision Making/ A&P                                 Medical Decision Making Amount and/or Complexity of Data Reviewed External Data Reviewed: notes.    Details: Prior echo 2023: EF 60-65%, grade II diastolic dysfunction Labs: ordered. Decision-making details documented in ED Course. Radiology: ordered and independent interpretation performed. Decision-making details documented in ED Course.  Risk Prescription drug management.   Differential diagnosis considered includes, but not limited to: Symptomatic anemia; CHF exacerbation; pneumonia; ACS  Patient directed to the ED by oncology for symptomatic anemia.  Patient with history of chronic anemia with need for periodic IV iron and blood transfusions, on scheduled doses of erythropoietin.  Anemia secondary to chronic disease, cancer and also has a history of chronic GI bleeding secondary to AVMs.  Patient reports increasing shortness of breath, dizziness with any attempt to ambulate.  Minimal walking gets her out of breath and she feels  very dizzy if she tries to stand up, ambulate or bend over.  Lab work shows hemoglobin of 6.8.  Patient's oncology records indicate that she gets transfused as needed for hemoglobin between 7 and 8 if she has symptoms and 2 units if she drops below 7.  Unfortunately, patient has significant antibodies and obtaining blood generally is quite delayed as it needs to come from [place].  Examination reveals significant pitting edema of her lower extremities.  She has chronic interstitial lung disease on x-ray, suspect that there is some increased volume exacerbating her shortness of breath.  Patient normally on Demadex 80 mg twice a day and Zaroxolyn.  She also gets additional IV Lasix with transfusions but reports that she did not get the Lasix with her last transfusion and that is when her leg swelling began.  Patient administered 80 mg of IV Lasix here.  Will admit.  She will likely need additional IV Lasix with her blood transfusion which will likely not happen sometime tomorrow.        Final Clinical Impression(s) / ED Diagnoses Final diagnoses:  Symptomatic anemia    Rx / DC Orders ED Discharge Orders     None         Millee Denise, Canary Brim, MD 01/16/23 0159

## 2023-01-16 NOTE — ED Notes (Signed)
ED TO INPATIENT HANDOFF REPORT  ED Nurse Name and Phone #: Francene Castle [phone]  S Name/Age/Gender [name] 89 y.o. female Room/Bed: 018C/018C  Code Status   Code Status: Limited: Do not attempt resuscitation (DNR) -DNR-LIMITED -Do Not Intubate/DNI   Home/SNF/Other Home Patient oriented to: self, place, time, and situation Is this baseline? Yes   Triage Complete: Triage complete  Chief Complaint Acute exacerbation of CHF (congestive heart failure) (HCC) [I50.9]  Triage Note PT hx of anemia. Cancer ctr yesterday showed hbg 7 so recommended that she get blood transfusion. She got a transfusion 2 weeks ago. She has had severe reactions in past. Has to be premedicated and blood comes from [place] per patient. She is also reporting bilateral leg swelling but she reports ONC saw her yesterday and is aware of the swelling.    Allergies Allergies  Allergen Reactions   Codeine Nausea And Vomiting    Hallucinations    Morphine And Codeine Nausea And Vomiting    Hallucinations    Amoxicillin Nausea Only    Level of Care/Admitting Diagnosis ED Disposition     ED Disposition  Admit   Condition  --   Comment  Hospital Area: [facility] [[id number]]  Level of Care: Med-Surg [[id number]]  May admit patient to [facility] or [facility] if equivalent level of care is available:: Yes  Covid Evaluation: Asymptomatic - no recent exposure (last 10 days) testing not required  Diagnosis: Acute exacerbation of CHF (congestive heart failure) [facility]) [[id number]]  Admitting Physician: Dolly Rias [[id number]]  Attending Physician: Dolly Rias [phone]  Certification:: I certify this patient will need inpatient services for at least 2 midnights  Expected Medical Readiness: 01/18/2023          B Medical/Surgery History Past Medical History:  Diagnosis Date   (HFpEF) heart failure with preserved ejection fraction (HCC)    EF=60-60%   Actinic keratosis 01/17/2015    R forearm   Adjustment disorder with anxiety    Adverse effect of other narcotics, sequela    Intolerance to all narcotics   Anti-Duffy antibodies present    Aortic valve stenosis    Arthritis    "some in my hands" (11/11/2012)   Atrial fibrillation, permanent (HCC)    Eliquis   Atypical mole 03/25/2018   L forearm - severe   Automatic implantable cardioverter-defibrillator in situ    Avascular necrosis of hip (HCC) 05/03/2011   Carotid artery stenosis 09/2007   a. 09/2007: 60-79% bilateral (stable); b. 10/2008: 40-59% R 60-79%    Cellulitis of left lower extremity 07/13/2020   CKD (chronic kidney disease), stage III (HCC)    COPD (chronic obstructive pulmonary disease) (HCC)    Coronary artery disease    non-obstructive by 2006 cath   COVID-19 virus infection 02/12/2022   Displaced fracture of left femoral neck (HCC) 10/09/2018   GI bleed 03/28/2020   AVM   High cholesterol    Hypertension 05/20/2011   Hypertr obst cardiomyop    Hypotension, unspecified    cardiac cath 2006..nonobstructive CAD 30-40s lesions.Marland KitchenETT 1/09 nondiagnostic due to poor HR response..Right Renal Cancer 2003   Iron deficiency anemia    Long term (current) use of anticoagulants    On home oxygen therapy    3L [place]   Osteoarthritis of right hip    PAD (peripheral artery disease) (HCC)    Pancreatic cancer (HCC)    sandostatin   PONV (postoperative nausea and vomiting)    Presence of permanent  cardiac pacemaker    Pulmonary HTN (HCC)    RECTAL BLEEDING 10/13/2009   Qualifier: Diagnosis of  By: Wilmon Pali NP, Paula     Red blood cell antibody positive with compatible PRBC difficult to obtain    Anti FYA (Duffy a) antibody. Must be transfused with PRB which are Duffy Antigen Negative and Crossmatch Compatible   Renal cell carcinoma (HCC)    s/p nephrectomy   Squamous cell carcinoma of skin 01/17/2015   R lat wrist   Squamous cell carcinoma of skin 01/29/2018   R post upper leg - superficially invasive    Squamous cell carcinoma of skin 03/25/2018   L lat foot   Squamous cell carcinoma of skin 11/09/2020   left lat foot - EDC 01/01/21, recurrent 01/31/21 - MOHs 02/22/21   Squamous cell carcinoma of skin 12/19/2021   Right Posterior Medial Thigh, EDC   Squamous cell carcinoma of skin 12/19/2021   SCC IS, L lat heel, EDC 02/04/2022   Squamous cell carcinoma of skin 01/21/2022   L forearm, EDC 02/04/2022   Squamous cell carcinoma of skin 01/21/2022   R lower leg below knee, EDC 02/04/2022   Squamous cell carcinoma of skin 01/21/2022   SCCIS, R post heel, EDC 02/04/2022   Squamous cell carcinoma of skin 07/01/2022   left lower abdomen, in situ, EDC   Squamous cell carcinoma of skin 07/01/2022   left medial chest, EDC   Urge incontinence    Venous stasis of both lower extremities    Past Surgical History:  Procedure Laterality Date   ABDOMINAL AORTOGRAM W/LOWER EXTREMITY N/A 02/12/2021   Procedure: ABDOMINAL AORTOGRAM W/LOWER EXTREMITY;  Surgeon: Maeola Harman, MD;  Location: [facility] INVASIVE CV LAB;  Service: Cardiovascular;  Laterality: N/A;   ABDOMINAL HYSTERECTOMY  1975   for benign causes   APPENDECTOMY     BI-VENTRICULAR PACEMAKER UPGRADE  05/04/2010   BIOPSY  02/28/2019   Procedure: BIOPSY;  Surgeon: Rachael Fee, MD;  Location: [facility] ENDOSCOPY;  Service: Endoscopy;;   BIOPSY  03/04/2022   Procedure: BIOPSY;  Surgeon: Lemar Lofty., MD;  Location: [facility] ENDOSCOPY;  Service: Gastroenterology;;   CARDIAC CATHETERIZATION  2006   CARDIOVERSION N/A 02/20/2018   Procedure: CARDIOVERSION;  Surgeon: Antonieta Iba, MD;  Location: [facility] ORS;  Service: Cardiovascular;  Laterality: N/A;   CARDIOVERSION N/A 03/27/2018   Procedure: CARDIOVERSION;  Surgeon: Antonieta Iba, MD;  Location: [facility] ORS;  Service: Cardiovascular;  Laterality: N/A;   CATARACT EXTRACTION W/ INTRAOCULAR LENS  IMPLANT, BILATERAL  01/2006-02-2006   CHOLECYSTECTOMY N/A 11/11/2012   Procedure:  LAPAROSCOPIC CHOLECYSTECTOMY WITH INTRAOPERATIVE CHOLANGIOGRAM;  Surgeon: Wilmon Arms. Corliss Skains, MD;  Location: [facility] OR;  Service: General;  Laterality: N/A;   COLONOSCOPY WITH PROPOFOL N/A 02/28/2019   Procedure: COLONOSCOPY WITH PROPOFOL;  Surgeon: Rachael Fee, MD;  Location: [facility] ENDOSCOPY;  Service: Endoscopy;  Laterality: N/A;   ENTEROSCOPY N/A 03/30/2020   Procedure: ENTEROSCOPY;  Surgeon: Shellia Cleverly, DO;  Location: [facility] ENDOSCOPY;  Service: Gastroenterology;  Laterality: N/A;   ENTEROSCOPY N/A 02/10/2021   Procedure: ENTEROSCOPY;  Surgeon: Jeani Hawking, MD;  Location: [facility] ENDOSCOPY;  Service: Endoscopy;  Laterality: N/A;   ENTEROSCOPY N/A 02/15/2022   Procedure: ENTEROSCOPY;  Surgeon: Imogene Burn, MD;  Location: [facility] ENDOSCOPY;  Service: Gastroenterology;  Laterality: N/A;   ENTEROSCOPY N/A 11/06/2022   Procedure: ENTEROSCOPY;  Surgeon: Meridee Score Netty Starring., MD;  Location: [facility] ENDOSCOPY;  Service: Gastroenterology;  Laterality: N/A;   EP IMPLANTABLE DEVICE N/A  02/21/2016   Procedure: ICD Generator Changeout;  Surgeon: Duke Salvia, MD;  Location: [facility] INVASIVE CV LAB;  Service: Cardiovascular;  Laterality: N/A;   ESOPHAGOGASTRODUODENOSCOPY N/A 03/04/2022   Procedure: ESOPHAGOGASTRODUODENOSCOPY (EGD);  Surgeon: Lemar Lofty., MD;  Location: [facility] ENDOSCOPY;  Service: Gastroenterology;  Laterality: N/A;   ESOPHAGOGASTRODUODENOSCOPY (EGD) WITH PROPOFOL N/A 02/28/2019   Procedure: ESOPHAGOGASTRODUODENOSCOPY (EGD) WITH PROPOFOL;  Surgeon: Rachael Fee, MD;  Location: [facility] ENDOSCOPY;  Service: Endoscopy;  Laterality: N/A;   EUS N/A 03/04/2022   Procedure: UPPER ENDOSCOPIC ULTRASOUND (EUS) RADIAL;  Surgeon: Lemar Lofty., MD;  Location: [facility] ENDOSCOPY;  Service: Gastroenterology;  Laterality: N/A;   FINE NEEDLE ASPIRATION N/A 03/04/2022   Procedure: FINE NEEDLE ASPIRATION (FNA) LINEAR;  Surgeon: Lemar Lofty., MD;  Location: [facility] ENDOSCOPY;  Service: Gastroenterology;   Laterality: N/A;   GIVENS CAPSULE STUDY N/A 03/15/2019   Procedure: GIVENS CAPSULE STUDY;  Surgeon: Tressia Danas, MD;  Location: [facility] ENDOSCOPY;  Service: Gastroenterology;  Laterality: N/A;   HOT HEMOSTASIS N/A 03/30/2020   Procedure: HOT HEMOSTASIS (ARGON PLASMA COAGULATION/BICAP);  Surgeon: Shellia Cleverly, DO;  Location: [facility] ENDOSCOPY;  Service: Gastroenterology;  Laterality: N/A;   HOT HEMOSTASIS N/A 02/10/2021   Procedure: HOT HEMOSTASIS (ARGON PLASMA COAGULATION/BICAP);  Surgeon: Jeani Hawking, MD;  Location: [facility] ENDOSCOPY;  Service: Endoscopy;  Laterality: N/A;   HOT HEMOSTASIS N/A 02/15/2022   Procedure: HOT HEMOSTASIS (ARGON PLASMA COAGULATION/BICAP);  Surgeon: Imogene Burn, MD;  Location: [facility] ENDOSCOPY;  Service: Gastroenterology;  Laterality: N/A;   HOT HEMOSTASIS N/A 11/06/2022   Procedure: HOT HEMOSTASIS (ARGON PLASMA COAGULATION/BICAP);  Surgeon: Lemar Lofty., MD;  Location: [facility] ENDOSCOPY;  Service: Gastroenterology;  Laterality: N/A;   INSERT / REPLACE / REMOVE PACEMAKER  05-01-11   02-28-05-/05-04-10-ICD-MEDTRONIC MAXIMAL DR   IR ANGIOGRAM SELECTIVE EACH ADDITIONAL VESSEL  02/18/2022   IR ANGIOGRAM SELECTIVE EACH ADDITIONAL VESSEL  02/18/2022   IR ANGIOGRAM VISCERAL SELECTIVE  02/16/2022   IR ANGIOGRAM VISCERAL SELECTIVE  02/16/2022   IR EMBO ART  VEN HEMORR LYMPH EXTRAV  INC GUIDE ROADMAPPING  02/16/2022   IR THORACENTESIS ASP PLEURAL SPACE W/IMG GUIDE  02/21/2022   IR THORACENTESIS ASP PLEURAL SPACE W/IMG GUIDE  02/22/2022   IR US GUIDE VASC ACCESS RIGHT  02/16/2022   JOINT REPLACEMENT     LAPAROSCOPIC CHOLECYSTECTOMY  11/11/2012   LAPAROSCOPIC LYSIS OF ADHESIONS N/A 11/11/2012   Procedure: LAPAROSCOPIC LYSIS OF ADHESIONS;  Surgeon: Wilmon Arms. Corliss Skains, MD;  Location: [facility] OR;  Service: General;  Laterality: N/A;   NEPHRECTOMY Right 06/2001    S/P RENAL CELL CANCER   PERIPHERAL VASCULAR INTERVENTION Bilateral 02/12/2021   Procedure: PERIPHERAL VASCULAR INTERVENTION;   Surgeon: Maeola Harman, MD;  Location: [facility] INVASIVE CV LAB;  Service: Cardiovascular;  Laterality: Bilateral;  Iliac artery stents   PRESSURE SENSOR/CARDIOMEMS N/A 02/03/2019   Procedure: PRESSURE SENSOR/CARDIOMEMS;  Surgeon: Laurey Morale, MD;  Location: [facility] INVASIVE CV LAB;  Service: Cardiovascular;  Laterality: N/A;   RIGHT HEART CATH N/A 11/09/2018   Procedure: RIGHT HEART CATH;  Surgeon: Laurey Morale, MD;  Location: [facility] INVASIVE CV LAB;  Service: Cardiovascular;  Laterality: N/A;   RIGHT HEART CATH N/A 03/08/2019   Procedure: RIGHT HEART CATH;  Surgeon: Laurey Morale, MD;  Location: [facility] INVASIVE CV LAB;  Service: Cardiovascular;  Laterality: N/A;   SUBMUCOSAL TATTOO INJECTION  02/28/2019   Procedure: SUBMUCOSAL TATTOO INJECTION;  Surgeon: Rachael Fee, MD;  Location: [facility] ENDOSCOPY;  Service: Endoscopy;;   SUBMUCOSAL  TATTOO INJECTION  11/06/2022   Procedure: SUBMUCOSAL TATTOO INJECTION;  Surgeon: Lemar Lofty., MD;  Location: [facility] ENDOSCOPY;  Service: Gastroenterology;;   TOTAL HIP ARTHROPLASTY Right 05/03/2011   Procedure: TOTAL HIP ARTHROPLASTY ANTERIOR APPROACH;  Surgeon: Kathryne Hitch;  Location: [facility] ORS;  Service: Orthopedics;  Laterality: Right;  Removal of Cannulated Screws Right Hip, Right Direct Anterior Hip Replacement   TOTAL HIP ARTHROPLASTY Left 10/09/2018   Procedure: TOTAL HIP ARTHROPLASTY ANTERIOR APPROACH;  Surgeon: Samson Frederic, MD;  Location: [facility] OR;  Service: Orthopedics;  Laterality: Left;     A IV Location/Drains/Wounds Patient Lines/Drains/Airways Status     Active Line/Drains/Airways     Name Placement date Placement time Site Days   Peripheral IV 01/16/23 18 G Left Antecubital 01/16/23  0118  Antecubital   less than 1   Wound / Incision (Open or Dehisced) 02/16/22 Puncture Groin Right 02/16/22  2102  Groin  334   Wound / Incision (Open or Dehisced) 11/04/22 Non-pressure wound Foot Right;Anterior recurrent SCC neoplasm right foot  11/04/22  2200  Foot  73   Wound / Incision (Open or Dehisced) 11/04/22 Non-pressure wound Foot Left;Lower Recurrent SCC neoplasm Left Lateral foot 11/04/22  2200  Foot  73            Intake/Output Last 24 hours No intake or output data in the 24 hours ending 01/16/23 0321  Labs/Imaging Results for orders placed or performed during the hospital encounter of 01/15/23 (from the past 48 hour(s))  Type and screen [facility]     Status: None   Collection Time: 01/15/23 11:17 PM  Result Value Ref Range   ABO/RH(D) A NEG    Antibody Screen POS    Sample Expiration 01/18/2023,2359    Antibody Identification      ANTI FYA (Duffy a) Performed at [facility], [address], [place], [place] [address]   CBC with Differential     Status: Abnormal   Collection Time: 01/15/23 11:18 PM  Result Value Ref Range   WBC 3.9 (L) 4.0 - 10.5 K/uL   RBC 2.61 (L) 3.87 - 5.11 MIL/uL   Hemoglobin 6.8 (LL) 12.0 - 15.0 g/dL    Comment: REPEATED TO VERIFY THIS CRITICAL RESULT HAS VERIFIED AND BEEN CALLED TO E PRIOR RN BY JALEESA WHITE ON 09 11 2024 AT 2339, AND HAS BEEN READ BACK.  THIS CRITICAL RESULT HAS VERIFIED AND BEEN CALLED TO E PRIOR RN BY JALEESA WHITE ON 09 11 2024 AT 2339, AND HAS BEEN READ BACK.     HCT 24.2 (L) 36.0 - 46.0 %   MCV 92.7 80.0 - 100.0 fL   MCH 26.1 26.0 - 34.0 pg   MCHC 28.1 (L) 30.0 - 36.0 g/dL   RDW 60.4 (H) 54.0 - 98.1 %   Platelets 183 150 - 400 K/uL   nRBC 0.0 0.0 - 0.2 %   Neutrophils Relative % 82 %   Neutro Abs 3.2 1.7 - 7.7 K/uL   Lymphocytes Relative 8 %   Lymphs Abs 0.3 (L) 0.7 - 4.0 K/uL   Monocytes Relative 9 %   Monocytes Absolute 0.4 0.1 - 1.0 K/uL   Eosinophils Relative 0 %   Eosinophils Absolute 0.0 0.0 - 0.5 K/uL   Basophils Relative 1 %   Basophils Absolute 0.0 0.0 - 0.1 K/uL   Immature Granulocytes 0 %   Abs Immature Granulocytes 0.01 0.00 - 0.07 K/uL    Comment: Performed at [facility], [address],  [place], [place] [address]  Basic metabolic panel     Status: Abnormal   Collection Time: 01/15/23 11:18 PM  Result Value Ref Range   Sodium 136 135 - 145 mmol/L   Potassium 4.4 3.5 - 5.1 mmol/L   Chloride 99 98 - 111 mmol/L   CO2 28 22 - 32 mmol/L   Glucose, Bld 191 (H) 70 - 99 mg/dL    Comment: Glucose reference range applies only to samples taken after fasting for at least 8 hours.   BUN 43 (H) 8 - 23 mg/dL   Creatinine, Ser 2.53 (H) 0.44 - 1.00 mg/dL   Calcium 8.7 (L) 8.9 - 10.3 mg/dL   GFR, Estimated 27 (L) >60 mL/min    Comment: (NOTE) Calculated using the CKD-EPI Creatinine Equation (2021)    Anion gap 9 5 - 15    Comment: Performed at [facility], [address], [place], [place] [address]  Brain natriuretic peptide     Status: Abnormal   Collection Time: 01/15/23 11:18 PM  Result Value Ref Range   B Natriuretic Peptide 342.9 (H) 0.0 - 100.0 pg/mL    Comment: Performed at [facility], [address] [place], [place] [address]  Prepare RBC (crossmatch)     Status: None   Collection Time: 01/16/23  3:05 AM  Result Value Ref Range   Order Confirmation      ORDER PROCESSED BY BLOOD BANK Performed at [facility], [address] [place], [place] [address]    *Note: Due to a large number of results and/or encounters for the requested time period, some results have not been displayed. A complete set of results can be found in Results Review.   DG Chest Port 1 View  Result Date: 01/16/2023 CLINICAL DATA:  Shortness of breath. EXAM: PORTABLE CHEST 1 VIEW COMPARISON:  December 09, 2022 FINDINGS: There is stable multilead AICD positioning. The heart size and mediastinal contours are within normal limits. There is marked severity calcification of the aortic arch. Mild, diffuse, chronic appearing increased interstitial lung markings are seen. Mild, stable atelectasis is seen within the bilateral lung bases with small, stable bilateral pleural effusions. No pneumothorax is  identified. No acute osseous abnormalities are identified. IMPRESSION: Stable, chronic appearing increased interstitial lung markings with mild bibasilar atelectasis and small bilateral pleural effusions. Electronically Signed   By: Aram Candela M.D.   On: 01/16/2023 01:19    Pending Labs Unresulted Labs (From admission, onward)     Start     Ordered   01/16/23 0500  Basic metabolic panel  Tomorrow morning,   R        01/16/23 0257   01/16/23 0500  Magnesium  Tomorrow morning,   R        01/16/23 0257   01/16/23 0500  Phosphorus  Tomorrow morning,   R        01/16/23 0257            Vitals/Pain Today's Vitals   01/15/23 2300 01/16/23 0115 01/16/23 0220 01/16/23 0300  BP: 121/87  (!) 131/40 (!) 136/55  Pulse: (!) 59 60  60  Resp: 18 19  19   Temp: 97.9 F (36.6 C)     TempSrc: Oral     SpO2: 94% 100%  100%    Isolation Precautions No active isolations  Medications Medications  furosemide (LASIX) 160 mg in dextrose 5 % 50 mL IVPB (has no administration  in time range)  ipratropium-albuterol (DUONEB) 0.5-2.5 (3) MG/3ML nebulizer solution 3 mL (has no administration in time range)  albuterol (PROVENTIL) (2.5 MG/3ML) 0.083% nebulizer solution 2.5 mg (has no administration in time range)  0.9 %  sodium chloride infusion (Manually program via Guardrails IV Fluids) (has no administration in time range)  diphenhydrAMINE (BENADRYL) capsule 50 mg (has no administration in time range)  acetaminophen (TYLENOL) tablet 650 mg (has no administration in time range)  famotidine (PEPCID) IVPB 20 mg premix (has no administration in time range)  methylPREDNISolone sodium succinate (SOLU-MEDROL) 125 mg/2 mL injection 125 mg (has no administration in time range)  0.9 %  sodium chloride infusion (Manually program via Guardrails IV Fluids) (has no administration in time range)  insulin aspart (novoLOG) injection 0-6 Units (has no administration in time range)    Mobility walks with person  assist     Focused Assessments    R Recommendations: See Admitting Provider Note  Report given to:   Additional Notes:

## 2023-01-17 DIAGNOSIS — I5023 Acute on chronic systolic (congestive) heart failure: Secondary | ICD-10-CM

## 2023-01-17 DIAGNOSIS — D649 Anemia, unspecified: Secondary | ICD-10-CM | POA: Diagnosis not present

## 2023-01-17 DIAGNOSIS — D3A8 Other benign neuroendocrine tumors: Secondary | ICD-10-CM | POA: Diagnosis not present

## 2023-01-17 DIAGNOSIS — K5520 Angiodysplasia of colon without hemorrhage: Secondary | ICD-10-CM

## 2023-01-17 DIAGNOSIS — D500 Iron deficiency anemia secondary to blood loss (chronic): Secondary | ICD-10-CM

## 2023-01-17 DIAGNOSIS — I5033 Acute on chronic diastolic (congestive) heart failure: Secondary | ICD-10-CM | POA: Diagnosis not present

## 2023-01-17 LAB — TYPE AND SCREEN
ABO/RH(D): A NEG
Antibody Screen: POSITIVE
Unit division: 0
Unit division: 0

## 2023-01-17 LAB — CBC
HCT: 30.3 % — ABNORMAL LOW (ref 36.0–46.0)
Hemoglobin: 9 g/dL — ABNORMAL LOW (ref 12.0–15.0)
MCH: 26.5 pg (ref 26.0–34.0)
MCHC: 29.7 g/dL — ABNORMAL LOW (ref 30.0–36.0)
MCV: 89.4 fL (ref 80.0–100.0)
Platelets: 189 10*3/uL (ref 150–400)
RBC: 3.39 MIL/uL — ABNORMAL LOW (ref 3.87–5.11)
RDW: 17.4 % — ABNORMAL HIGH (ref 11.5–15.5)
WBC: 8.4 10*3/uL (ref 4.0–10.5)
nRBC: 0.8 % — ABNORMAL HIGH (ref 0.0–0.2)

## 2023-01-17 LAB — BPAM RBC
Blood Product Expiration Date: 202410112359
Blood Product Expiration Date: 202410142359
ISSUE DATE / TIME: 202409120914
ISSUE DATE / TIME: 202409121319
Unit Type and Rh: 600
Unit Type and Rh: 600

## 2023-01-17 LAB — BASIC METABOLIC PANEL
Anion gap: 11 (ref 5–15)
BUN: 51 mg/dL — ABNORMAL HIGH (ref 8–23)
CO2: 28 mmol/L (ref 22–32)
Calcium: 8.9 mg/dL (ref 8.9–10.3)
Chloride: 97 mmol/L — ABNORMAL LOW (ref 98–111)
Creatinine, Ser: 1.78 mg/dL — ABNORMAL HIGH (ref 0.44–1.00)
GFR, Estimated: 27 mL/min — ABNORMAL LOW (ref >60)
Glucose, Bld: 134 mg/dL — ABNORMAL HIGH (ref 70–99)
Potassium: 3.9 mmol/L (ref 3.5–5.1)
Sodium: 136 mmol/L (ref 135–145)

## 2023-01-17 LAB — BRAIN NATRIURETIC PEPTIDE: B Natriuretic Peptide: 584.4 pg/mL — ABNORMAL HIGH (ref 0.0–100.0)

## 2023-01-17 LAB — MAGNESIUM: Magnesium: 2.3 mg/dL (ref 1.7–2.4)

## 2023-01-17 LAB — GLUCOSE, CAPILLARY
Glucose-Capillary: 114 mg/dL — ABNORMAL HIGH (ref 70–99)
Glucose-Capillary: 117 mg/dL — ABNORMAL HIGH (ref 70–99)
Glucose-Capillary: 121 mg/dL — ABNORMAL HIGH (ref 70–99)
Glucose-Capillary: 119 mg/dL — ABNORMAL HIGH (ref 70–99)

## 2023-01-17 MED ORDER — METOLAZONE 2.5 MG PO TABS
2.5000 mg | ORAL_TABLET | Freq: Once | ORAL | Status: AC
  Administered 2023-01-17: 2.5 mg via ORAL
  Filled 2023-01-17: qty 1

## 2023-01-17 NOTE — Progress Notes (Signed)
Mobility Specialist Progress Note:    01/17/23 1423  Mobility  Activity Refused mobility   Attempted to see pt twice. Pt refused mobility d/t fatigue and dizziness. Will f/u as able.    Thompson Grayer Mobility Specialist  Please contact vis Secure Chat or  Rehab Office [phone]

## 2023-01-17 NOTE — Plan of Care (Signed)
Problem: Activity: Goal: Ability to return to baseline activity level will improve Outcome: Progressing   Problem: Cardiovascular: Goal: Ability to achieve and maintain adequate cardiovascular perfusion will improve Outcome: Progressing Goal: Vascular access site(s) Level 0-1 will be maintained Outcome: Progressing   Problem: Health Behavior/Discharge Planning: Goal: Ability to safely manage health-related needs after discharge will improve Outcome: Progressing

## 2023-01-17 NOTE — Progress Notes (Signed)
PROGRESS NOTE    [name]  ZOX:[id number] DOB: December 19, 1933 DOA: 01/15/2023 PCP: [name] Boyden, MD    Brief Narrative:   [name] is a 89 y.o. 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  Was seen at cancer center day prior with labs showing hemoglobin of 7 with recommendation of blood transfusion.  Patient notes progressive dyspnea over the last 2 weeks, last blood transfusion 2 weeks prior.  Decreased exercise tolerance.  Also reports increased lower extremity edema in which she called her cardiology office and advised to increase your torsemide to 100 mg p.o. twice daily and was also given a prescription for metolazone, but has yet to use.  Has complicated history of antibodies and transfusion reactions requiring pretreatment.  Normally gets 2 units of blood if hemoglobin less than 7 per oncology note.  Has had recent melena, but none for several days now.  In the ED, temperature 97.9 F, HR 59, RR 18, BP 121/87, SpO2 94% on 3 L nasal cannula.  WBC 3.9, hemoglobin 6.8, platelet count 183.  Sodium 136, potassium 4.4, chloride 99, CO2 28, glucose 191, BUN 43, creatinine 1.81.  BNP 342.9.  Chest x-ray with stable chronic appearing increased interstitial lung markings with mild bibasilar atelectasis and small bilateral pleural effusions.  IV Lasix ordered by EDP.  TRH consulted for admission for further evaluation and management of symptomatic anemia, CHF  exacerbation.  Assessment & Plan:    Symptomatic anemia, acute on chronic Hx AVMs Patient presenting with progressive dyspnea and found to have hemoglobin 6.8.  Etiology likely secondary to chronic GI blood loss/iron deficiency anemia.  Anemia panel with iron 36, TIBC 340, ferritin 15, folate 32.5, vitamin B12 709.  Transfused 2 unit PRBCs on 9/12. -- Hgb 6.8>8.8>9.0 -- Repeat H&H @ 12pm today -- CBC qam -- Will consult GI for further recommendations given melanotic stools with history of AVM, follows with Dr. Barron [name] outpatient; but per patient's outpatient discussions with recommendations of avoidance of any endoscopic procedures given her comorbidities, advanced age and was deferring to treat conservatively with transfusions  Acute on chronic diastolic congestive heart failure exacerbation Patient presenting with progressive dyspnea, increased lower extremity edema.  BNP elevated at 342.9.  Chest x-ray with no pulmonary edema.  TTE 01/16/2023 with LVEF 65 to 70%, biatrial enlargement, severe TR, moderate AAS, IVC dilated. -- Continue torsemide 100 mg p.o. twice daily -- Metolazone 2.5 mg p.o. x 1 today -- Strict I's and O's and daily weights  Elevated troponin High sensitive troponin elevated 35 followed by 33.  Etiology likely secondary to type II demand ischemia in the setting of anemia and CHF exacerbation. -- Continue to monitor on telemetry  Paroxysmal atrial fibrillation off of anticoagulation Hx PPM/ICD Off of anticoagulation due to history of GI bleeding from AVMs.  Has PPM, ICD has been deactivated per chart review. -- Continue monitor on telemetry  CAD History of TIA Carotid artery stenosis Continue Crestor 40 mg p.o. daily.  Not on aspirin.  History of essential hypertension Currently not on antihypertensives outpatient.  Chronic  89.4  PLT 182 183  --  189   Basic Metabolic Panel: Recent Labs  Lab 01/14/23 1426 01/15/23 2318 01/16/23 0327 01/17/23 0405  NA 139 136 137 136  K 4.9 4.4 4.0 3.9  CL 102 99 97* 97*  CO2 32 28 29 28   GLUCOSE 75 191* 100* 134*  BUN 46* 43* 42* 51*  CREATININE 1.83* 1.81* 1.72* 1.78*  CALCIUM 9.1 8.7* 8.8* 8.9  MG  --   --  2.3 2.3  PHOS  --   --  4.0  --    GFR: Estimated Creatinine Clearance: 21.2 mL/min (A) (by C-G formula  based on SCr of 1.78 mg/dL (H)). Liver Function Tests: Recent Labs  Lab 01/14/23 1426  AST 12*  ALT 7  ALKPHOS 40  BILITOT 0.6  PROT 7.2  ALBUMIN 4.1   No results for input(s): "LIPASE", "AMYLASE" in the last 168 hours. No results for input(s): "AMMONIA" in the last 168 hours. Coagulation Profile: No results for input(s): "INR", "PROTIME" in the last 168 hours. Cardiac Enzymes: No results for input(s): "CKTOTAL", "CKMB", "CKMBINDEX", "TROPONINI" in the last 168 hours. BNP (last 3 results) No results for input(s): "PROBNP" in the last 8760 hours. HbA1C: No results for input(s): "HGBA1C" in the last 72 hours. CBG: Recent Labs  Lab 01/16/23 1239 01/16/23 1618 01/16/23 2119 01/17/23 0634  GLUCAP 99 176* 129* 114*   Lipid Profile: No results for input(s): "CHOL", "HDL", "LDLCALC", "TRIG", "CHOLHDL", "LDLDIRECT" in the last 72 hours. Thyroid Function Tests: No results for input(s): "TSH", "T4TOTAL", "FREET4", "T3FREE", "THYROIDAB" in the last 72 hours. Anemia Panel: Recent Labs    01/16/23 0829  VITAMINB12 709  FOLATE 32.5  FERRITIN 15  TIBC 340  IRON 36  RETICCTPCT 4.5*   Sepsis Labs: No results for input(s): "PROCALCITON", "LATICACIDVEN" in the last 168 hours.  No results found for this or any previous visit (from the past 240 hour(s)).       Radiology Studies: ECHOCARDIOGRAM COMPLETE  Result Date: 01/16/2023    ECHOCARDIOGRAM REPORT   Patient Name:   [name] Date of Exam: 01/16/2023 Medical Rec #:  [id number]         Height:       66.0 in Accession #:    [id number]        Weight:       140.0 lb Date of Birth:  May 12, 1933         BSA:          1.719 m Patient Age:    88 years          BP:           127/58 mmHg Patient Gender: F                 HR:           64 bpm. Exam Location:  Inpatient Procedure: 2D Echo, Color Doppler and Cardiac Doppler Indications:     I50.9* Heart failure (unspecified)  History:         Patient has prior history of Echocardiogram  examinations, most                  recent 02/15/2022. CHF, Defibrillator, COPD, Arrythmias:Atrial                  Fibrillation; Risk Factors:Dyslipidemia and Hypertension.  Sonographer:     Irving Burton Senior RDCS Referring Phys:  [id number] Dolly Rias Diagnosing Phys: Freida Busman McleanMD IMPRESSIONS  1.  PROGRESS NOTE    [name]  ZOX:[id number] DOB: December 19, 1933 DOA: 01/15/2023 PCP: [name] Boyden, MD    Brief Narrative:   [name] is a 89 y.o. 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  Was seen at cancer center day prior with labs showing hemoglobin of 7 with recommendation of blood transfusion.  Patient notes progressive dyspnea over the last 2 weeks, last blood transfusion 2 weeks prior.  Decreased exercise tolerance.  Also reports increased lower extremity edema in which she called her cardiology office and advised to increase your torsemide to 100 mg p.o. twice daily and was also given a prescription for metolazone, but has yet to use.  Has complicated history of antibodies and transfusion reactions requiring pretreatment.  Normally gets 2 units of blood if hemoglobin less than 7 per oncology note.  Has had recent melena, but none for several days now.  In the ED, temperature 97.9 F, HR 59, RR 18, BP 121/87, SpO2 94% on 3 L nasal cannula.  WBC 3.9, hemoglobin 6.8, platelet count 183.  Sodium 136, potassium 4.4, chloride 99, CO2 28, glucose 191, BUN 43, creatinine 1.81.  BNP 342.9.  Chest x-ray with stable chronic appearing increased interstitial lung markings with mild bibasilar atelectasis and small bilateral pleural effusions.  IV Lasix ordered by EDP.  TRH consulted for admission for further evaluation and management of symptomatic anemia, CHF  exacerbation.  Assessment & Plan:    Symptomatic anemia, acute on chronic Hx AVMs Patient presenting with progressive dyspnea and found to have hemoglobin 6.8.  Etiology likely secondary to chronic GI blood loss/iron deficiency anemia.  Anemia panel with iron 36, TIBC 340, ferritin 15, folate 32.5, vitamin B12 709.  Transfused 2 unit PRBCs on 9/12. -- Hgb 6.8>8.8>9.0 -- Repeat H&H @ 12pm today -- CBC qam -- Will consult GI for further recommendations given melanotic stools with history of AVM, follows with Dr. Barron [name] outpatient; but per patient's outpatient discussions with recommendations of avoidance of any endoscopic procedures given her comorbidities, advanced age and was deferring to treat conservatively with transfusions  Acute on chronic diastolic congestive heart failure exacerbation Patient presenting with progressive dyspnea, increased lower extremity edema.  BNP elevated at 342.9.  Chest x-ray with no pulmonary edema.  TTE 01/16/2023 with LVEF 65 to 70%, biatrial enlargement, severe TR, moderate AAS, IVC dilated. -- Continue torsemide 100 mg p.o. twice daily -- Metolazone 2.5 mg p.o. x 1 today -- Strict I's and O's and daily weights  Elevated troponin High sensitive troponin elevated 35 followed by 33.  Etiology likely secondary to type II demand ischemia in the setting of anemia and CHF exacerbation. -- Continue to monitor on telemetry  Paroxysmal atrial fibrillation off of anticoagulation Hx PPM/ICD Off of anticoagulation due to history of GI bleeding from AVMs.  Has PPM, ICD has been deactivated per chart review. -- Continue monitor on telemetry  CAD History of TIA Carotid artery stenosis Continue Crestor 40 mg p.o. daily.  Not on aspirin.  History of essential hypertension Currently not on antihypertensives outpatient.  Chronic  PROGRESS NOTE    [name]  ZOX:[id number] DOB: December 19, 1933 DOA: 01/15/2023 PCP: [name] Boyden, MD    Brief Narrative:   [name] is a 89 y.o. 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  Was seen at cancer center day prior with labs showing hemoglobin of 7 with recommendation of blood transfusion.  Patient notes progressive dyspnea over the last 2 weeks, last blood transfusion 2 weeks prior.  Decreased exercise tolerance.  Also reports increased lower extremity edema in which she called her cardiology office and advised to increase your torsemide to 100 mg p.o. twice daily and was also given a prescription for metolazone, but has yet to use.  Has complicated history of antibodies and transfusion reactions requiring pretreatment.  Normally gets 2 units of blood if hemoglobin less than 7 per oncology note.  Has had recent melena, but none for several days now.  In the ED, temperature 97.9 F, HR 59, RR 18, BP 121/87, SpO2 94% on 3 L nasal cannula.  WBC 3.9, hemoglobin 6.8, platelet count 183.  Sodium 136, potassium 4.4, chloride 99, CO2 28, glucose 191, BUN 43, creatinine 1.81.  BNP 342.9.  Chest x-ray with stable chronic appearing increased interstitial lung markings with mild bibasilar atelectasis and small bilateral pleural effusions.  IV Lasix ordered by EDP.  TRH consulted for admission for further evaluation and management of symptomatic anemia, CHF  exacerbation.  Assessment & Plan:    Symptomatic anemia, acute on chronic Hx AVMs Patient presenting with progressive dyspnea and found to have hemoglobin 6.8.  Etiology likely secondary to chronic GI blood loss/iron deficiency anemia.  Anemia panel with iron 36, TIBC 340, ferritin 15, folate 32.5, vitamin B12 709.  Transfused 2 unit PRBCs on 9/12. -- Hgb 6.8>8.8>9.0 -- Repeat H&H @ 12pm today -- CBC qam -- Will consult GI for further recommendations given melanotic stools with history of AVM, follows with Dr. Barron [name] outpatient; but per patient's outpatient discussions with recommendations of avoidance of any endoscopic procedures given her comorbidities, advanced age and was deferring to treat conservatively with transfusions  Acute on chronic diastolic congestive heart failure exacerbation Patient presenting with progressive dyspnea, increased lower extremity edema.  BNP elevated at 342.9.  Chest x-ray with no pulmonary edema.  TTE 01/16/2023 with LVEF 65 to 70%, biatrial enlargement, severe TR, moderate AAS, IVC dilated. -- Continue torsemide 100 mg p.o. twice daily -- Metolazone 2.5 mg p.o. x 1 today -- Strict I's and O's and daily weights  Elevated troponin High sensitive troponin elevated 35 followed by 33.  Etiology likely secondary to type II demand ischemia in the setting of anemia and CHF exacerbation. -- Continue to monitor on telemetry  Paroxysmal atrial fibrillation off of anticoagulation Hx PPM/ICD Off of anticoagulation due to history of GI bleeding from AVMs.  Has PPM, ICD has been deactivated per chart review. -- Continue monitor on telemetry  CAD History of TIA Carotid artery stenosis Continue Crestor 40 mg p.o. daily.  Not on aspirin.  History of essential hypertension Currently not on antihypertensives outpatient.  Chronic  PROGRESS NOTE    [name]  ZOX:[id number] DOB: December 19, 1933 DOA: 01/15/2023 PCP: [name] Boyden, MD    Brief Narrative:   [name] is a 89 y.o. 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  Was seen at cancer center day prior with labs showing hemoglobin of 7 with recommendation of blood transfusion.  Patient notes progressive dyspnea over the last 2 weeks, last blood transfusion 2 weeks prior.  Decreased exercise tolerance.  Also reports increased lower extremity edema in which she called her cardiology office and advised to increase your torsemide to 100 mg p.o. twice daily and was also given a prescription for metolazone, but has yet to use.  Has complicated history of antibodies and transfusion reactions requiring pretreatment.  Normally gets 2 units of blood if hemoglobin less than 7 per oncology note.  Has had recent melena, but none for several days now.  In the ED, temperature 97.9 F, HR 59, RR 18, BP 121/87, SpO2 94% on 3 L nasal cannula.  WBC 3.9, hemoglobin 6.8, platelet count 183.  Sodium 136, potassium 4.4, chloride 99, CO2 28, glucose 191, BUN 43, creatinine 1.81.  BNP 342.9.  Chest x-ray with stable chronic appearing increased interstitial lung markings with mild bibasilar atelectasis and small bilateral pleural effusions.  IV Lasix ordered by EDP.  TRH consulted for admission for further evaluation and management of symptomatic anemia, CHF  exacerbation.  Assessment & Plan:    Symptomatic anemia, acute on chronic Hx AVMs Patient presenting with progressive dyspnea and found to have hemoglobin 6.8.  Etiology likely secondary to chronic GI blood loss/iron deficiency anemia.  Anemia panel with iron 36, TIBC 340, ferritin 15, folate 32.5, vitamin B12 709.  Transfused 2 unit PRBCs on 9/12. -- Hgb 6.8>8.8>9.0 -- Repeat H&H @ 12pm today -- CBC qam -- Will consult GI for further recommendations given melanotic stools with history of AVM, follows with Dr. Barron [name] outpatient; but per patient's outpatient discussions with recommendations of avoidance of any endoscopic procedures given her comorbidities, advanced age and was deferring to treat conservatively with transfusions  Acute on chronic diastolic congestive heart failure exacerbation Patient presenting with progressive dyspnea, increased lower extremity edema.  BNP elevated at 342.9.  Chest x-ray with no pulmonary edema.  TTE 01/16/2023 with LVEF 65 to 70%, biatrial enlargement, severe TR, moderate AAS, IVC dilated. -- Continue torsemide 100 mg p.o. twice daily -- Metolazone 2.5 mg p.o. x 1 today -- Strict I's and O's and daily weights  Elevated troponin High sensitive troponin elevated 35 followed by 33.  Etiology likely secondary to type II demand ischemia in the setting of anemia and CHF exacerbation. -- Continue to monitor on telemetry  Paroxysmal atrial fibrillation off of anticoagulation Hx PPM/ICD Off of anticoagulation due to history of GI bleeding from AVMs.  Has PPM, ICD has been deactivated per chart review. -- Continue monitor on telemetry  CAD History of TIA Carotid artery stenosis Continue Crestor 40 mg p.o. daily.  Not on aspirin.  History of essential hypertension Currently not on antihypertensives outpatient.  Chronic  PROGRESS NOTE    [name]  ZOX:[id number] DOB: December 19, 1933 DOA: 01/15/2023 PCP: [name] Boyden, MD    Brief Narrative:   [name] is a 89 y.o. 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  Was seen at cancer center day prior with labs showing hemoglobin of 7 with recommendation of blood transfusion.  Patient notes progressive dyspnea over the last 2 weeks, last blood transfusion 2 weeks prior.  Decreased exercise tolerance.  Also reports increased lower extremity edema in which she called her cardiology office and advised to increase your torsemide to 100 mg p.o. twice daily and was also given a prescription for metolazone, but has yet to use.  Has complicated history of antibodies and transfusion reactions requiring pretreatment.  Normally gets 2 units of blood if hemoglobin less than 7 per oncology note.  Has had recent melena, but none for several days now.  In the ED, temperature 97.9 F, HR 59, RR 18, BP 121/87, SpO2 94% on 3 L nasal cannula.  WBC 3.9, hemoglobin 6.8, platelet count 183.  Sodium 136, potassium 4.4, chloride 99, CO2 28, glucose 191, BUN 43, creatinine 1.81.  BNP 342.9.  Chest x-ray with stable chronic appearing increased interstitial lung markings with mild bibasilar atelectasis and small bilateral pleural effusions.  IV Lasix ordered by EDP.  TRH consulted for admission for further evaluation and management of symptomatic anemia, CHF  exacerbation.  Assessment & Plan:    Symptomatic anemia, acute on chronic Hx AVMs Patient presenting with progressive dyspnea and found to have hemoglobin 6.8.  Etiology likely secondary to chronic GI blood loss/iron deficiency anemia.  Anemia panel with iron 36, TIBC 340, ferritin 15, folate 32.5, vitamin B12 709.  Transfused 2 unit PRBCs on 9/12. -- Hgb 6.8>8.8>9.0 -- Repeat H&H @ 12pm today -- CBC qam -- Will consult GI for further recommendations given melanotic stools with history of AVM, follows with Dr. Barron [name] outpatient; but per patient's outpatient discussions with recommendations of avoidance of any endoscopic procedures given her comorbidities, advanced age and was deferring to treat conservatively with transfusions  Acute on chronic diastolic congestive heart failure exacerbation Patient presenting with progressive dyspnea, increased lower extremity edema.  BNP elevated at 342.9.  Chest x-ray with no pulmonary edema.  TTE 01/16/2023 with LVEF 65 to 70%, biatrial enlargement, severe TR, moderate AAS, IVC dilated. -- Continue torsemide 100 mg p.o. twice daily -- Metolazone 2.5 mg p.o. x 1 today -- Strict I's and O's and daily weights  Elevated troponin High sensitive troponin elevated 35 followed by 33.  Etiology likely secondary to type II demand ischemia in the setting of anemia and CHF exacerbation. -- Continue to monitor on telemetry  Paroxysmal atrial fibrillation off of anticoagulation Hx PPM/ICD Off of anticoagulation due to history of GI bleeding from AVMs.  Has PPM, ICD has been deactivated per chart review. -- Continue monitor on telemetry  CAD History of TIA Carotid artery stenosis Continue Crestor 40 mg p.o. daily.  Not on aspirin.  History of essential hypertension Currently not on antihypertensives outpatient.  Chronic

## 2023-01-17 NOTE — Consult Note (Addendum)
Consultation  Referring Provider:    Primary Care Physician:  [name], MD Primary Gastroenterologist:  Dr. Tomasa Rand       Reason for Consultation:     Anemia DOA: 01/15/2023         Hospital Day: 3         HPI:   [name] is a 89 y.o. female with past medical history significant for chronic hypoxia on 2 L nasal cannula, CAD, PAD, hypertension, HFpEF, CKD stage III, RCC status post nephrectomy, A-fib off Eliquis secondary to GI bleeding, small bowel AVMs, ischemic colitis, pancreatic neuroendocrine tumor on Sandostatin LR 20 mg., complicated by antibiotic and transfusion reactions..   Recent admission 7/1 through 7/6 for melena hemoglobin 6.8, CTA negative for GI bleed but did show cirrhosis indeterminate liver lesion.  Also found to have CHF exacerbation treated IV Lasix Small bowel nephroscopy 7/three 2 cm hiatal hernia LA grade a esophagitis, 2 nonbleeding AVMs status post APC and duodenum 3 nonbleeding AVMs jejunum status post APC 7/22 readmission for melena.  Hemoglobin that time 6.8 patient had been out of her Sandostatin for several months.  Status post 1 PRBC with improvement to 7.8.  Baseline appears to be between 8 and 9.  No endoscopic evaluations during that admission. 9/10 office visit with Dr. Michail Sermon to the ER 9/12 complaining of weakness and DOE. Patient complaining of recent melena which stopped several days ago, GI consulted. Work up notable for Hgb 6.8, normal iron, ferritin, folate, B12. BNP 342, troponin 35 and then 33  Patient with family at bedside, daughter. Provided some of the history.  Patient has been having intermittent melenic stools. Patient has been receiving ESA every 2 weeks with hematology, IV iron as needed and infusions. Patient states she had 1 PRBC 01/01/2023 with hematology outpatient after transfusion she had flushing, some nausea chills. Patient states since that time she had not felt good, had had worsening leg  swelling, DOE, they went to Dr. Brooke Dare 9/10 hemoglobin was 7 was complaining of DOE weakness and leg swelling and sent to the ER for evaluation. Patient has been taking her Sandostatin, is not missed any doses. Patient's had intermittent dark stools but not tarry.  She is on IV iron. Denies nausea, vomiting, abdominal pain.   PREVIOUS GI WORKUP--------------------------------------------------- **Most recent studies. Not all inclusive**   Oct 2020 colonoscopy for iron deficiency anemia Good prep.  -Diverticulosis throughout colon  - Patchy, moderate inflammation in the sigmoid colon. I have most concern for ischemic, possibly chronic. Two sites were biopsied. These findings could possibly explain acute on chronic, heme + anemia. - I have concerns about what appear to be ischemic changes in her left colon causing bleeding, anemia. I recommend up to date cardilogy consultation as she has known CHF, Afib and also consider messenteric vascular workup. -    Nov 2020 Capsule study    Oct 2022 Small bowel endoscopy for melena - Normal esophagus. - A few non-bleeding angiodysplastic lesions in the stomach. Treated with a monopolar probe. - Four bleeding angiodysplastic lesions in the duodenum. Treated with a monopolar probe. - A single non-bleeding angiodysplastic lesion in the jejunum. Treated with a monopolar probe. - No specimens collected.   Oct 2023 small bowel endoscopy for melena - Normal esophagus. - Hiatal hernia. - Gastritis. - Two non-bleeding angioectasias in the duodenum. Treated with APC - The examined portion of the jejunum was normal. - No specimens collected. -  Oct 2023 EGD and EUS EGD impression: - No gross lesions in the entire esophagus. Z-line irregular, 38 cm from the incisors. - 3 cm hiatal hernia. - Erosive gastropathy with no bleeding and no stigmata of recent bleeding. No other gross lesions in the entire stomach. Biopsied. - Duodenal erosion without bleeding -likely site  of prior APC. No other gross lesions in the duodenal bulb, in the first portion of the duodenum and in the second portion of the duodenum. - Normal major papilla pointed superiorly.    EUS impression: - A mass was identified in the pancreatic head/uncinate process. Cytology results are pending. However, the endosonographic appearance is suggestive of a neuroendocrine tumor. This was staged T3 N0 Mx by endosonographic criteria. The staging applies if malignancy is confirmed. Fine needle biopsy performed. -2 lesions were identified in the pancreatic tail. Cytology results are pending. However, the endosonographic appearance is suspicious for potential synchronous neuroendocrine tumors. Fine needle biopsy performed. - There was no sign of significant pathology in the common bile duct and in the common hepatic duct. - No malignant-appearing lymph nodes were visualized in the celiac region (level 20), peripancreatic region and porta hepatis region.   FINAL MICROSCOPIC DIAGNOSIS:  - Neuroendocrine neoplasm  The cytologic features are most consistent with pancreatic  neuroendocrine neoplasm.  A. GASTRIC BIOPSY:  Fragments of gastric mucosa with minimal vascular ectasia.  H. pylori, intestinal metaplasia, atrophy and dysplasia are not  identified.   Abnormal ED labs: Abnormal Labs Reviewed  CBC WITH DIFFERENTIAL/PLATELET - Abnormal; Notable for the following components:      Result Value   WBC 3.9 (*)    RBC 2.61 (*)    Hemoglobin 6.8 (*)    HCT 24.2 (*)    MCHC 28.1 (*)    RDW 18.0 (*)    Lymphs Abs 0.3 (*)    All other components within normal limits  BASIC METABOLIC PANEL - Abnormal; Notable for the following components:   Glucose, Bld 191 (*)    BUN 43 (*)    Creatinine, Ser 1.81 (*)    Calcium 8.7 (*)    GFR, Estimated 27 (*)    All other components within normal limits  BRAIN NATRIURETIC PEPTIDE - Abnormal; Notable for the following components:   B Natriuretic Peptide 342.9 (*)    All  other components within normal limits  BASIC METABOLIC PANEL - Abnormal; Notable for the following components:   Chloride 97 (*)    Glucose, Bld 100 (*)    BUN 42 (*)    Creatinine, Ser 1.72 (*)    Calcium 8.8 (*)    GFR, Estimated 29 (*)    All other components within normal limits  RETICULOCYTES - Abnormal; Notable for the following components:   Retic Ct Pct 4.5 (*)    RBC. 2.57 (*)    Immature Retic Fract 25.6 (*)    All other components within normal limits  GLUCOSE, CAPILLARY - Abnormal; Notable for the following components:   Glucose-Capillary 176 (*)    All other components within normal limits  CBC - Abnormal; Notable for the following components:   RBC 3.39 (*)    Hemoglobin 9.0 (*)    HCT 30.3 (*)    MCHC 29.7 (*)    RDW 17.4 (*)    nRBC 0.8 (*)    All other components within normal limits  BASIC METABOLIC PANEL - Abnormal; Notable for the following components:   Chloride 97 (*)    Glucose, Bld  134 (*)    BUN 51 (*)    Creatinine, Ser 1.78 (*)    GFR, Estimated 27 (*)    All other components within normal limits  BRAIN NATRIURETIC PEPTIDE - Abnormal; Notable for the following components:   B Natriuretic Peptide 584.4 (*)    All other components within normal limits  HEMOGLOBIN AND HEMATOCRIT, BLOOD - Abnormal; Notable for the following components:   Hemoglobin 8.8 (*)    HCT 29.5 (*)    All other components within normal limits  GLUCOSE, CAPILLARY - Abnormal; Notable for the following components:   Glucose-Capillary 129 (*)    All other components within normal limits  GLUCOSE, CAPILLARY - Abnormal; Notable for the following components:   Glucose-Capillary 114 (*)    All other components within normal limits  GLUCOSE, CAPILLARY - Abnormal; Notable for the following components:   Glucose-Capillary 117 (*)    All other components within normal limits  TROPONIN I (HIGH SENSITIVITY) - Abnormal; Notable for the following components:   Troponin I (High  Sensitivity) 35 (*)    All other components within normal limits  TROPONIN I (HIGH SENSITIVITY) - Abnormal; Notable for the following components:   Troponin I (High Sensitivity) 33 (*)    All other components within normal limits    Past Medical History:  Diagnosis Date   (HFpEF) heart failure with preserved ejection fraction (HCC)    EF=60-60%   Actinic keratosis 01/17/2015   R forearm   Adjustment disorder with anxiety    Adverse effect of other narcotics, sequela    Intolerance to all narcotics   Anti-Duffy antibodies present    Aortic valve stenosis    Arthritis    "some in my hands" (11/11/2012)   Atrial fibrillation, permanent (HCC)    Eliquis   Atypical mole 03/25/2018   L forearm - severe   Automatic implantable cardioverter-defibrillator in situ    Avascular necrosis of hip (HCC) 05/03/2011   Carotid artery stenosis 09/2007   a. 09/2007: 60-79% bilateral (stable); b. 10/2008: 40-59% R 60-79%    Cellulitis of left lower extremity 07/13/2020   CKD (chronic kidney disease), stage III (HCC)    COPD (chronic obstructive pulmonary disease) (HCC)    Coronary artery disease    non-obstructive by 2006 cath   COVID-19 virus infection 02/12/2022   Displaced fracture of left femoral neck (HCC) 10/09/2018   GI bleed 03/28/2020   AVM   High cholesterol    Hypertension 05/20/2011   Hypertr obst cardiomyop    Hypotension, unspecified    cardiac cath 2006..nonobstructive CAD 30-40s lesions.Marland KitchenETT 1/09 nondiagnostic due to poor HR response..Right Renal Cancer 2003   Iron deficiency anemia    Long term (current) use of anticoagulants    On home oxygen therapy    3L [place]   Osteoarthritis of right hip    PAD (peripheral artery disease) (HCC)    Pancreatic cancer (HCC)    sandostatin   PONV (postoperative nausea and vomiting)    Presence of permanent cardiac pacemaker    Pulmonary HTN (HCC)    RECTAL BLEEDING 10/13/2009   Qualifier: Diagnosis of  By: Wilmon Pali NP, Paula     Red blood  cell antibody positive with compatible PRBC difficult to obtain    Anti FYA (Duffy a) antibody. Must be transfused with PRB which are Duffy Antigen Negative and Crossmatch Compatible   Renal cell carcinoma (HCC)    s/p nephrectomy   Squamous cell carcinoma of skin 01/17/2015  R lat wrist   Squamous cell carcinoma of skin 01/29/2018   R post upper leg - superficially invasive   Squamous cell carcinoma of skin 03/25/2018   L lat foot   Squamous cell carcinoma of skin 11/09/2020   left lat foot - EDC 01/01/21, recurrent 01/31/21 - MOHs 02/22/21   Squamous cell carcinoma of skin 12/19/2021   Right Posterior Medial Thigh, EDC   Squamous cell carcinoma of skin 12/19/2021   SCC IS, L lat heel, EDC 02/04/2022   Squamous cell carcinoma of skin 01/21/2022   L forearm, EDC 02/04/2022   Squamous cell carcinoma of skin 01/21/2022   R lower leg below knee, EDC 02/04/2022   Squamous cell carcinoma of skin 01/21/2022   SCCIS, R post heel, EDC 02/04/2022   Squamous cell carcinoma of skin 07/01/2022   left lower abdomen, in situ, EDC   Squamous cell carcinoma of skin 07/01/2022   left medial chest, EDC   Urge incontinence    Venous stasis of both lower extremities     Surgical History:  She  has a past surgical history that includes Abdominal hysterectomy (1975); Cardiac catheterization (2006); Nephrectomy (Right, 06/2001); Cataract extraction w/ intraocular lens  implant, bilateral (01/2006-02-2006); Insert / replace / remove pacemaker (05-01-11); Appendectomy; Total hip arthroplasty (Right, 05/03/2011); Laparoscopic cholecystectomy (11/11/2012); Bi-ventricular pacemaker upgrade (05/04/2010); Joint replacement; Cholecystectomy (N/A, 11/11/2012); Laparoscopic lysis of adhesions (N/A, 11/11/2012); Cardiac catheterization (N/A, 02/21/2016); CARDIOVERSION (N/A, 02/20/2018); CARDIOVERSION (N/A, 03/27/2018); Total hip arthroplasty (Left, 10/09/2018); RIGHT HEART CATH (N/A, 11/09/2018); PRESSURE SENSOR/CARDIOMEMS (N/A,  02/03/2019); Esophagogastroduodenoscopy (egd) with propofol (N/A, 02/28/2019); Colonoscopy with propofol (N/A, 02/28/2019); Submucosal tattoo injection (02/28/2019); biopsy (02/28/2019); RIGHT HEART CATH (N/A, 03/08/2019); [place] capsule study (N/A, 03/15/2019); enteroscopy (N/A, 03/30/2020); Hot hemostasis (N/A, 03/30/2020); enteroscopy (N/A, 02/10/2021); Hot hemostasis (N/A, 02/10/2021); ABDOMINAL AORTOGRAM W/LOWER EXTREMITY (N/A, 02/12/2021); PERIPHERAL VASCULAR INTERVENTION (Bilateral, 02/12/2021); IR US Guide Vasc Access Right (02/16/2022); IR Angiogram Visceral Selective (02/16/2022); IR EMBO ART  VEN HEMORR LYMPH EXTRAV  INC GUIDE ROADMAPPING (02/16/2022); IR Angiogram Visceral Selective (02/16/2022); IR Angiogram Selective Each Additional Vessel (02/18/2022); IR Angiogram Selective Each Additional Vessel (02/18/2022); enteroscopy (N/A, 02/15/2022); Hot hemostasis (N/A, 02/15/2022); IR THORACENTESIS ASP PLEURAL SPACE W/IMG GUIDE (02/21/2022); IR THORACENTESIS ASP PLEURAL SPACE W/IMG GUIDE (02/22/2022); EUS (N/A, 03/04/2022); Esophagogastroduodenoscopy (N/A, 03/04/2022); biopsy (03/04/2022); Fine needle aspiration (N/A, 03/04/2022); enteroscopy (N/A, 11/06/2022); Hot hemostasis (N/A, 11/06/2022); and Submucosal tattoo injection (11/06/2022). Family History:  Her family history includes Breast cancer in her cousin and another family member; Breast cancer (age of onset: 74) in her maternal aunt; Early death in her father; Heart failure in her mother. Social History:   reports that she quit smoking about 21 years ago. Her smoking use included cigarettes. She started smoking about 61 years ago. She has a 20 pack-year smoking history. She has never used smokeless tobacco. She reports that she does not drink alcohol and does not use drugs.  Prior to Admission medications   Medication Sig Start Date End Date Taking? Authorizing Provider  acetaminophen (TYLENOL) 500 MG tablet Take 1,000 mg by mouth every 8 (eight) hours  as needed for mild pain or headache.   Yes [provider]  Cyanocobalamin (B-12 PO) Take 1 tablet by mouth daily.   Yes [provider]  ferrous gluconate (FERGON) 324 MG tablet Take 1 tablet (324 mg total) by mouth daily with breakfast. 11/15/22  Yes Rai, Ripudeep K, MD  fluticasone (FLONASE) 50 MCG/ACT nasal spray Place 2 sprays into both nostrils daily as needed for  allergies or rhinitis. 07/04/22  Yes [name], MD  pantoprazole (PROTONIX) 40 MG tablet Take 1 tablet (40 mg total) by mouth daily. 11/09/22  Yes Rai, Ripudeep K, MD  potassium chloride SA (KLOR-CON M) 20 MEQ tablet Take 1 tablet (20 mEq total) by mouth 2 (two) times daily. Patient taking differently: Take 20 mEq by mouth 2 (two) times daily. 40 meq in am and 20 meq 11/09/22  Yes Rai, Ripudeep K, MD  rosuvastatin (CRESTOR) 40 MG tablet Take 1 tablet (40 mg total) by mouth daily. Patient taking differently: Take 40 mg by mouth every evening. 05/02/22  Yes [name], MD  torsemide (DEMADEX) 20 MG tablet Take 4 tablets (80 mg total) by mouth 2 (two) times daily. Patient taking differently: Take 1,000 mg by mouth 2 (two) times daily. 08/21/22  Yes [place], Anderson Malta, FNP  vitamin C (ASCORBIC ACID) 250 MG tablet Take 500 mg by mouth daily.   Yes [provider]  VITAMIN D PO Take 4,000 Units by mouth daily.   Yes [provider]    Current Facility-Administered Medications  Medication Dose Route Frequency Provider Last Rate Last Admin   acetaminophen (TYLENOL) tablet 650 mg  650 mg Oral Q6H PRN [place], Eric J, DO       albuterol (PROVENTIL) (2.5 MG/3ML) 0.083% nebulizer solution 2.5 mg  2.5 mg Nebulization Q2H PRN Segars, Christiane Ha, MD       ascorbic acid (VITAMIN C) tablet 500 mg  500 mg Oral Daily Segars, Christiane Ha, MD   500 mg at 01/17/23 0813   cyanocobalamin (VITAMIN B12) tablet   Oral Daily Dolly Rias, MD   1,000 mcg at 01/17/23 0813   famotidine (PEPCID) IVPB 20 mg premix  20  mg Intravenous PRN Dolly Rias, MD   Stopped at 01/16/23 0915   ferrous gluconate (FERGON) tablet 324 mg  324 mg Oral Q breakfast Dolly Rias, MD   324 mg at 01/17/23 0814   insulin aspart (novoLOG) injection 0-6 Units  0-6 Units Subcutaneous TID WC Dolly Rias, MD   1 Units at 01/16/23 1700   ipratropium-albuterol (DUONEB) 0.5-2.5 (3) MG/3ML nebulizer solution 3 mL  3 mL Nebulization Q6H Segars, Christiane Ha, MD   3 mL at 01/17/23 0813   methylPREDNISolone sodium succinate (SOLU-MEDROL) 125 mg/2 mL injection 125 mg  125 mg Intravenous PRN Dolly Rias, MD   125 mg at 01/16/23 0845   pantoprazole (PROTONIX) EC tablet 40 mg  40 mg Oral Daily Dolly Rias, MD   40 mg at 01/17/23 0813   rosuvastatin (CRESTOR) tablet 40 mg  40 mg Oral QPM Dolly Rias, MD   40 mg at 01/16/23 1717   torsemide (DEMADEX) tablet 100 mg  100 mg Oral BID [place], Eric J, DO   100 mg at 01/17/23 0813    Allergies as of 01/15/2023 - Review Complete 01/15/2023  Allergen Reaction Noted   Codeine Nausea And Vomiting    Morphine and codeine Nausea And Vomiting 11/04/2012   Amoxicillin Nausea Only 05/20/2022    Review of Systems:    Constitutional: No weight loss, fever, chills, weakness or fatigue HEENT: Eyes: No change in vision               Ears, Nose, Throat:  No change in hearing or congestion Skin: No rash or itching Cardiovascular: No chest pain, chest pressure or palpitations   Respiratory: No SOB or cough Gastrointestinal: See HPI and otherwise negative Genitourinary: No dysuria or change in urinary frequency Neurological: No  headache, dizziness or syncope Musculoskeletal: No new muscle or joint pain Hematologic: No bleeding or bruising Psychiatric: No history of depression or anxiety     Physical Exam:  Vital signs in last 24 hours: Temp:  [97.8 F (36.6 C)-99 F (37.2 C)] 98 F (36.7 C) (09/13 1124) Pulse Rate:  [59-64] 63 (09/13 1124) Resp:  [15-20] 18 (09/13 0900) BP:  (122-154)/(46-67) 127/53 (09/13 1124) SpO2:  [88 %-100 %] 96 % (09/13 0900) Weight:  [63.5 kg] 63.5 kg (09/13 0407) Last BM Date : 01/16/23 Last BM recorded by nurses in past 5 days No data recorded  General:   Chronically ill-appearing female NAD Head:  Normocephalic and atraumatic. Eyes: sclerae anicteric,conjunctive pale  Heart:  regular rate and rhythm, systolic murmur Pulm: Diffuse decreased breath sounds with wheezing, 2 L nasal cannula Abdomen:  Soft, Obese AB, Sluggish bowel sounds. No tenderness . Extremities: With 2-3+ edema, erythema, stasis dermatitis. Msk:  Symmetrical without gross deformities. Peripheral pulses intact.  Neurologic:  Alert and  oriented x4;  No focal deficits.  Skin:   Dry and intact without significant lesions or rashes. Psychiatric:  Cooperative. Normal mood and affect.  LAB RESULTS: Recent Labs    01/14/23 1426 01/15/23 2318 01/16/23 2119 01/17/23 0405  WBC 4.1 3.9*  --  8.4  HGB 7.0* 6.8* 8.8* 9.0*  HCT 23.9* 24.2* 29.5* 30.3*  PLT 182 183  --  189   BMET Recent Labs    01/15/23 2318 01/16/23 0327 01/17/23 0405  NA 136 137 136  K 4.4 4.0 3.9  CL 99 97* 97*  CO2 28 29 28   GLUCOSE 191* 100* 134*  BUN 43* 42* 51*  CREATININE 1.81* 1.72* 1.78*  CALCIUM 8.7* 8.8* 8.9   LFT Recent Labs    01/14/23 1426  PROT 7.2  ALBUMIN 4.1  AST 12*  ALT 7  ALKPHOS 40  BILITOT 0.6   PT/INR No results for input(s): "LABPROT", "INR" in the last 72 hours.  STUDIES: ECHOCARDIOGRAM COMPLETE  Result Date: 01/16/2023    ECHOCARDIOGRAM REPORT   Patient Name:   [name] Date of Exam: 01/16/2023 Medical Rec #:  [id number]         Height:       66.0 in Accession #:    [id number]        Weight:       140.0 lb Date of Birth:  21-Jan-1934         BSA:          1.719 m Patient Age:    88 years          BP:           127/58 mmHg Patient Gender: F                 HR:           64 bpm. Exam Location:  Inpatient Procedure: 2D Echo, Color Doppler and  Cardiac Doppler Indications:     I50.9* Heart failure (unspecified)  History:         Patient has prior history of Echocardiogram examinations, most                  recent 02/15/2022. CHF, Defibrillator, COPD, Arrythmias:Atrial                  Fibrillation; Risk Factors:Dyslipidemia and Hypertension.  Sonographer:     Irving Burton Senior RDCS Referring Phys:  [id number] Dolly Rias Diagnosing Phys: Wilfred Lacy IMPRESSIONS  1. Left ventricular ejection fraction, by estimation, is 65 to 70%. Left ventricular ejection fraction by PLAX is 60 %. The left ventricle has normal function. The left ventricle has no regional wall motion abnormalities. There is mild concentric left ventricular hypertrophy. Left ventricular diastolic parameters are indeterminate.  2. D-shaped interventricular septum suggests RV pressure/volume overload. Right ventricular systolic function is mildly reduced. The right ventricular size is mildly enlarged. There is severely elevated pulmonary artery systolic pressure. The estimated right ventricular systolic pressure is 103.0 mmHg.  3. Left atrial size was moderately dilated.  4. Right atrial size was severely dilated.  5. The mitral valve is degenerative. Mild mitral valve regurgitation. Moderate mitral stenosis. The mean mitral valve gradient is 6.0 mmHg with average heart rate of 65 bpm, MVA 1.52 cm^2 by VTI. Moderate mitral annular calcification.  6. The tricuspid valve is abnormal. Tricuspid valve regurgitation is severe.  7. The aortic valve is tricuspid. There is moderate calcification of the aortic valve. Aortic valve regurgitation is mild. Moderate aortic valve stenosis. Aortic valve area, by VTI measures 1.10 cm. Aortic valve mean gradient measures 17.0 mmHg. Aortic  valve Vmax measures 2.68 m/s.  8. The inferior vena cava is dilated in size with <50% respiratory variability, suggesting right atrial pressure of 15 mmHg.  9. The patient is in atrial fibrillation FINDINGS  Left Ventricle:  Left ventricular ejection fraction, by estimation, is 65 to 70%. Left ventricular ejection fraction by PLAX is 60 %. The left ventricle has normal function. The left ventricle has no regional wall motion abnormalities. The left ventricular internal cavity size was normal in size. There is mild concentric left ventricular hypertrophy. Left ventricular diastolic function could not be evaluated due to mitral stenosis. Left ventricular diastolic parameters are indeterminate. Right Ventricle: D-shaped interventricular septum suggests RV pressure/volume overload. The right ventricular size is mildly enlarged. No increase in right ventricular wall thickness. Right ventricular systolic function is mildly reduced. There is severely elevated pulmonary artery systolic pressure. The tricuspid regurgitant velocity is 4.69 m/s, and with an assumed right atrial pressure of 15 mmHg, the estimated right ventricular systolic pressure is 103.0 mmHg. Left Atrium: Left atrial size was moderately dilated. Right Atrium: Right atrial size was severely dilated. Pericardium: There is no evidence of pericardial effusion. Mitral Valve: The mitral valve is degenerative in appearance. There is mild calcification of the mitral valve leaflet(s). Moderate mitral annular calcification. Mild mitral valve regurgitation. Moderate mitral valve stenosis. MV peak gradient, 22.3 mmHg.  The mean mitral valve gradient is 6.0 mmHg with average heart rate of 65 bpm. Tricuspid Valve: The tricuspid valve is abnormal. Tricuspid valve regurgitation is severe. Aortic Valve: The aortic valve is tricuspid. There is moderate calcification of the aortic valve. Aortic valve regurgitation is mild. Moderate aortic stenosis is present. Aortic valve mean gradient measures 17.0 mmHg. Aortic valve peak gradient measures 28.7 mmHg. Aortic valve area, by VTI measures 1.10 cm. Pulmonic Valve: The pulmonic valve was normal in structure. Pulmonic valve regurgitation is mild.  Aorta: The aortic root and ascending aorta are structurally normal, with no evidence of dilitation. Venous: The inferior vena cava is dilated in size with less than 50% respiratory variability, suggesting right atrial pressure of 15 mmHg. The inferior vena cava and the hepatic vein show a pattern of systolic flow reversal, suggestive of tricuspid regurgitation. IAS/Shunts: No atrial level shunt detected by color flow Doppler. Additional Comments: A device lead is visualized in the right atrium and right ventricle.  LEFT VENTRICLE PLAX  2D LV EF:         Left            Diastology                ventricular     LV e' medial:    6.74 cm/s                ejection        LV E/e' medial:  30.3                fraction by     LV e' lateral:   7.62 cm/s                PLAX is 60      LV E/e' lateral: 26.8                %. LVIDd:         3.20 cm LVIDs:         2.20 cm LV PW:         1.40 cm LV IVS:        1.20 cm LVOT diam:     1.80 cm LV SV:         71 LV SV Index:   41 LVOT Area:     2.54 cm  RIGHT VENTRICLE RV S prime:     11.40 cm/s TAPSE (M-mode): 1.6 cm LEFT ATRIUM             Index        RIGHT ATRIUM           Index LA diam:        4.80 cm 2.79 cm/m   RA Area:     32.90 cm LA Vol (A2C):   84.1 ml 48.94 ml/m  RA Volume:   131.00 ml 76.23 ml/m LA Vol (A4C):   64.5 ml 37.53 ml/m LA Biplane Vol: 73.5 ml 42.77 ml/m  AORTIC VALVE AV Area (Vmax):    1.14 cm AV Area (Vmean):   1.18 cm AV Area (VTI):     1.10 cm AV Vmax:           268.00 cm/s AV Vmean:          197.000 cm/s AV VTI:            0.641 m AV Peak Grad:      28.7 mmHg AV Mean Grad:      17.0 mmHg LVOT Vmax:         120.00 cm/s LVOT Vmean:        91.000 cm/s LVOT VTI:          0.278 m LVOT/AV VTI ratio: 0.43  AORTA Ao Root diam: 2.50 cm Ao Asc diam:  3.10 cm MITRAL VALVE                TRICUSPID VALVE MV Area (PHT): 4.36 cm     TV Peak grad:   19.7 mmHg MV Area VTI:   1.27 cm     TV Mean grad:   4.0 mmHg MV Peak grad:  22.3 mmHg    TV Vmax:        2.22  m/s MV Mean grad:  6.0 mmHg     TV Vmean:       81.0 cm/s MV Vmax:       2.36 m/s     TV VTI:         0.49 msec MV  Vmean:      106.0 cm/s   TR Peak grad:   88.0 mmHg MV Decel Time: 174 msec     TR Vmax:        469.00 cm/s MV E velocity: 204.00 cm/s MV A velocity: 104.00 cm/s  SHUNTS MV E/A ratio:  1.96         Systemic VTI:  0.28 m                             Systemic Diam: 1.80 cm Dalton McleanMD Electronically signed by Wilfred Lacy Signature Date/Time: 01/16/2023/3:06:18 PM    Final (Updated)    DG Chest Port 1 View  Result Date: 01/16/2023 CLINICAL DATA:  Shortness of breath. EXAM: PORTABLE CHEST 1 VIEW COMPARISON:  December 09, 2022 FINDINGS: There is stable multilead AICD positioning. The heart size and mediastinal contours are within normal limits. There is marked severity calcification of the aortic arch. Mild, diffuse, chronic appearing increased interstitial lung markings are seen. Mild, stable atelectasis is seen within the bilateral lung bases with small, stable bilateral pleural effusions. No pneumothorax is identified. No acute osseous abnormalities are identified. IMPRESSION: Stable, chronic appearing increased interstitial lung markings with mild bibasilar atelectasis and small bilateral pleural effusions. Electronically Signed   By: Aram Candela M.D.   On: 01/16/2023 01:19      Impression /plan   Acute on chronic symptomatic anemia in setting of AVM, complaining of intermittent melena Off Eliquis secondary to GI bleeds Hgb 6.8 (7.0 on 9/10, 7.2 on 8/26, 7.7 on 8/13, 7.7 on 8/5, 7.8 on 7/23) S/p 1 PRBC Small bowel enteroscopy 7/3 showed  2 cm hiatal hernia, LA grade a esophagitis, 2 nonbleeding angiodysplastic lesions in the duodenum (treated with APC).  Normal mucosa in entire examined duodenum.  3 nonbleeding angiodysplastic lesions in jejunum (treated with APC).  Normal mucosa in jejunum. -Continue daily CBC and transfuse as needed maintain hemoglobin above 7 -Unless  significant drop in hemoglobin or hemodynamically unstable no plan for endoscopic procedures at this time. -Continue supportive care outpatient with following up with hematology Dr. Mosetta Putt.  DOE with bilateral leg swelling multifactorial with HFpEF severe RV dysfunction, respiratory failure on 2 L O2 Worsened after outpatient blood transfusion BNP 342 Chest x-ray with chronic interstitial changes no overt pulmonary edema Patient should coordinate with heart failure clinic and Dr. Mosetta Putt for action plan for diuretics after blood transfusions, likely needs extra doses day of and day after.  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 Surgery wasn't recommended in multifocal NET and given age and co-morbidities. Treated with sandostatin injections     Principal Problem:   Acute exacerbation of CHF (congestive heart failure) (HCC) Active Problems:   Acute on chronic anemia   Chronic hypoxic respiratory failure (HCC)    LOS: 1 day   Thank you for your kind consultation, we will continue to follow.   Doree Albee  01/17/2023, 12:14 PM   Attending physician's note  I have taken a history, reviewed the chart and examined the patient. I performed a substantive portion of this encounter, including complete performance of at least one of the key components, in conjunction with the APP. I agree with the APP's note, impression and recommendations.    89 year old female with multiple comorbidities ILD chronic hypoxia on home O2, CAD,  A-fib on Eliquis pancreatic neuroendocrine tumor on Sandostatin, ischemic colitis, renal cell carcinoma s/p nephrectomy admitted with an worsening anemia Dyspnea on exertion improving with diuresis, has significant lower extremity edema Hemoglobin responded appropriately to PRBC transfusion.  No overt GI bleeding No plan for repeat endoscopic  evaluation at this point Continue supportive care Continue Sandostatin for pancreatic neuroendocrine tumor Follow-up with oncology as outpatient  GI signing off, available if needed  The patient was provided an opportunity to ask questions and all were answered. The patient agreed with the plan and demonstrated an understanding of the instructions.  Iona Beard , MD [phone]

## 2023-01-17 NOTE — Plan of Care (Signed)
Problem: Education: Goal: Understanding of CV disease, CV risk reduction, and recovery process will improve Outcome: Progressing   Problem: Activity: Goal: Ability to return to baseline activity level will improve Outcome: Progressing   Problem: Coping: Goal: Ability to adjust to condition or change in health will improve Outcome: Progressing

## 2023-01-18 DIAGNOSIS — I5033 Acute on chronic diastolic (congestive) heart failure: Secondary | ICD-10-CM | POA: Diagnosis not present

## 2023-01-18 DIAGNOSIS — K5520 Angiodysplasia of colon without hemorrhage: Secondary | ICD-10-CM | POA: Diagnosis not present

## 2023-01-18 DIAGNOSIS — D649 Anemia, unspecified: Secondary | ICD-10-CM | POA: Diagnosis not present

## 2023-01-18 LAB — GLUCOSE, CAPILLARY
Glucose-Capillary: 119 mg/dL — ABNORMAL HIGH (ref 70–99)
Glucose-Capillary: 88 mg/dL (ref 70–99)

## 2023-01-18 LAB — CBC
HCT: 27.4 % — ABNORMAL LOW (ref 36.0–46.0)
Hemoglobin: 8 g/dL — ABNORMAL LOW (ref 12.0–15.0)
MCH: 26.6 pg (ref 26.0–34.0)
MCHC: 29.2 g/dL — ABNORMAL LOW (ref 30.0–36.0)
MCV: 91 fL (ref 80.0–100.0)
Platelets: 159 10*3/uL (ref 150–400)
RBC: 3.01 MIL/uL — ABNORMAL LOW (ref 3.87–5.11)
RDW: 18.2 % — ABNORMAL HIGH (ref 11.5–15.5)
WBC: 4.9 10*3/uL (ref 4.0–10.5)
nRBC: 1.2 % — ABNORMAL HIGH (ref 0.0–0.2)

## 2023-01-18 LAB — BASIC METABOLIC PANEL
Anion gap: 11 (ref 5–15)
BUN: 57 mg/dL — ABNORMAL HIGH (ref 8–23)
CO2: 31 mmol/L (ref 22–32)
Calcium: 8.8 mg/dL — ABNORMAL LOW (ref 8.9–10.3)
Chloride: 96 mmol/L — ABNORMAL LOW (ref 98–111)
Creatinine, Ser: 1.87 mg/dL — ABNORMAL HIGH (ref 0.44–1.00)
GFR, Estimated: 26 mL/min — ABNORMAL LOW (ref >60)
Glucose, Bld: 127 mg/dL — ABNORMAL HIGH (ref 70–99)
Potassium: 3.8 mmol/L (ref 3.5–5.1)
Sodium: 138 mmol/L (ref 135–145)

## 2023-01-18 MED ORDER — TORSEMIDE 20 MG PO TABS: 100.0000 mg | ORAL_TABLET | Freq: Two times a day (BID) | ORAL | Status: DC

## 2023-01-18 NOTE — TOC Transition Note (Addendum)
Transition of Care [facility]) - CM/SW Discharge Note   Patient Details  Name: [name] MRN: [id number] Date of Birth: 12/19/1933  [facility] Care [facility]) CM/SW Contact:  Lawerance Sabal, RN Phone Number: 01/18/2023, 10:02 AM   Clinical Narrative:   10:00 Called patient's room twice no answer, called her home number no answer, called granddaughter [name] twice, LVM.  Unable to reach patient or family.  Patient history in [place] shows she has been active with [facility] 3 times in the past two years, I have made referral to [facility] for [facility] PT.  11:45 Received call back from [place], informed her of referral to [facility], appreciative, no other needs.   Final next level of care: Home w Home Health Services Barriers to Discharge: No Barriers Identified   Patient Goals and CMS Choice      Discharge Placement                         Discharge Plan and Services Additional resources added to the After Visit Summary for                              [facility] Agency: [facility] Date [facility] Agency Contacted: 01/18/23 Time HH Agency Contacted: [id number] Representative spoke with at [facility] Agency: Rivka Barbara  Social Determinants of Health (SDOH) Interventions SDOH Screenings   Food Insecurity: No Food Insecurity (01/16/2023)  Housing: Low Risk  (01/16/2023)  Transportation Needs: Unmet Transportation Needs (01/16/2023)  Utilities: Not At Risk (01/16/2023)  Depression (PHQ2-9): Low Risk  (12/09/2022)  Financial Resource Strain: Low Risk  (05/28/2022)  Physical Activity: Inactive (09/24/2021)  Stress: No Stress Concern Present (09/24/2021)  Tobacco Use: Medium Risk (01/15/2023)     Readmission Risk Interventions    11/09/2022   12:00 PM 02/15/2021   11:38 AM  Readmission Risk Prevention Plan  Transportation Screening Complete Complete  PCP or Specialist Appt within 5-7 Days  Complete  PCP or Specialist Appt within 3-5 Days Complete   Home Care Screening  Complete  Medication Review (RN CM)   Complete  HRI or Home Care Consult Complete   Social Work Consult for Recovery Care Planning/Counseling Complete   Medication Review Oceanographer) Complete

## 2023-01-18 NOTE — Discharge Summary (Signed)
Physician Discharge Summary  [name] NFA:[id number] DOB: Feb 06, 1934 DOA: 01/15/2023  PCP: [name], MD  Admit date: 01/15/2023 Discharge date: 01/18/2023  Admitted From: Home Disposition: Home  Recommendations for Outpatient Follow-up:  Follow up with PCP in 1-2 weeks Outpatient follow-up with gastroenterology, Dr. [place]ron Alvine Outpatient follow-up with medical oncology, Dr. Mosetta Putt Outpatient follow-up with cardiology, Dr. 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  Was seen at cancer center day prior with labs showing hemoglobin of 7 with recommendation of blood transfusion.  Patient notes progressive dyspnea over the last 2 weeks, last blood transfusion 2 weeks prior.  Decreased exercise tolerance.  Also reports increased lower extremity edema in which she called her cardiology office and advised to increase your torsemide to 100 mg p.o.  twice daily and was also given a prescription for metolazone, but has yet to use.  Has complicated history of antibodies and transfusion reactions requiring pretreatment.  Normally gets 2 units of blood if hemoglobin less than 7 per oncology note.  Has had recent melena, but none for several days now.   In the ED, temperature 97.9 F, HR 59, RR 18, BP 121/87, SpO2 94% on 3 L nasal cannula.  WBC 3.9, hemoglobin 6.8, platelet count 183.  Sodium 136, potassium 4.4, chloride 99, CO2 28, glucose 191, BUN 43, creatinine 1.81.  BNP 342.9.  Chest x-ray with stable chronic appearing increased interstitial lung markings with mild bibasilar atelectasis and small bilateral pleural effusions.  IV Lasix ordered by EDP.  TRH consulted for admission for further evaluation and management of symptomatic anemia, CHF exacerbation.  Hospital course:  Symptomatic anemia, acute on chronic Hx AVMs Patient presenting with progressive dyspnea and found to have hemoglobin 6.8.  Etiology likely secondary to chronic GI blood loss/iron deficiency anemia.  Anemia panel with iron 36, TIBC 340, ferritin 15, folate 32.5, vitamin B12 709.  Transfused 2 unit PRBCs on 9/12.  GI was consulted with no recommendations for endoscopic procedures during this hospitalization.  Patient had discussions with her primary gastroenterologist, Dr. [place] Alvine with recommendations to avoid any endoscopic procedures given her comorbidities, advanced age.  Hemoglobin on discharge 8.0.  Recommend repeat CBC 1 week.   Acute on chronic diastolic congestive heart failure exacerbation Patient presenting with progressive dyspnea, increased lower extremity edema.  BNP elevated at 342.9.  Chest x-ray with no pulmonary edema.  TTE 01/16/2023 with LVEF 65 to 70%, biatrial enlargement, severe TR, moderate AAS, IVC dilated.  Patient was given IV Lasix and metolazone with improvement of symptoms.  Will continue on patient's recently increased torsemide 100 mg p.o. twice  daily.  Patient instructed to maintain daily weight log.  Outpatient follow-up with cardiology, Dr. Shirlee Latch 1-2 weeks.  Recommend BMP at that visit to ensure creatinine stable.   Elevated troponin High sensitive troponin elevated 35 followed by 33.  Etiology likely secondary to type II demand ischemia in the setting of anemia and CHF exacerbation.   Paroxysmal atrial fibrillation off of anticoagulation Hx PPM/ICD Off of anticoagulation due to history of GI bleeding from AVMs.  Has PPM, ICD has been deactivated per chart review.   CAD History of TIA Carotid artery stenosis Continue Crestor 40 mg p.o. daily.  Not on aspirin.   History of essential hypertension Currently not on antihypertensives outpatient.   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  Continues to maintain adequate oxygenation on her home 2 L nasal cannula.   CKD stage IIIb Hx renal cell carcinoma with history of nephrectomy Baseline creatinine 1.5-1.6.  Recommend repeat BMP 1 week.  Pancreatic neuroendocrine tumor Follows with medical oncology, Dr. Mosetta Putt outpatient.  On Sandostatin injections.   Weakness/deconditioning/debility: Seen by physical therapy, recommending home health on discharge.  Discharge Diagnoses:  Principal Problem:   Acute exacerbation of CHF (congestive heart failure) (HCC) Active Problems:   Acute on chronic anemia   Chronic hypoxic respiratory failure [facility])    Discharge Instructions  Discharge Instructions     Call MD for:  difficulty breathing, headache or visual disturbances   Complete by: As directed    Call MD for:  extreme fatigue   Complete by: As directed    Call MD for:  persistant dizziness or light-headedness   Complete by: As directed    Call MD for:  persistant nausea and vomiting   Complete by: As directed    Call MD for:   severe uncontrolled pain   Complete by: As directed    Call MD for:  temperature >100.4   Complete by: As directed    Diet - low sodium heart healthy   Complete by: As directed    Increase activity slowly   Complete by: As directed       Allergies as of 01/18/2023       Reactions   Codeine Nausea And Vomiting   Hallucinations    Morphine And Codeine Nausea And Vomiting   Hallucinations    Amoxicillin Nausea Only        Medication List     TAKE these medications    acetaminophen 500 MG tablet Commonly known as: TYLENOL Take 1,000 mg by mouth every 8 (eight) hours as needed for mild pain or headache.   B-12 PO Take 1 tablet by mouth daily.   ferrous gluconate 324 MG tablet Commonly known as: FERGON Take 1 tablet (324 mg total) by mouth daily with breakfast.   fluticasone 50 MCG/ACT nasal spray Commonly known as: FLONASE Place 2 sprays into both nostrils daily as needed for allergies or rhinitis.   pantoprazole 40 MG tablet Commonly known as: PROTONIX Take 1 tablet (40 mg total) by mouth daily.   potassium chloride SA 20 MEQ tablet Commonly known as: KLOR-CON M Take 1 tablet (20 mEq total) by mouth 2 (two) times daily. What changed: additional instructions   rosuvastatin 40 MG tablet Commonly known as: CRESTOR Take 1 tablet (40 mg total) by mouth daily. What changed: when to take this   torsemide 20 MG tablet Commonly known as: DEMADEX Take  5 tablets (100 mg total) by mouth 2 (two) times daily. What changed: how much to take   vitamin C 250 MG tablet Commonly known as: ASCORBIC ACID Take 500 mg by mouth daily.   VITAMIN D PO Take 4,000 Units by mouth daily.        Follow-up Information     [name], MD. Schedule an appointment as soon as possible for a visit in 1 week(s).   Specialty: Family Medicine Contact information: [address] [place] [address] [phone]         Malachy Mood, MD. Schedule an appointment as  soon as possible for a visit.   Specialties: Hematology, Oncology Contact information: [address] [place] [address] [phone]         Laurey Morale, MD. Schedule an appointment as soon as possible for a visit.   Specialty: Cardiology Contact information: [address] [place] [address] [phone]         Shellia Cleverly, DO. Schedule an appointment as soon as possible for a visit.   Specialty: Gastroenterology Contact information: [address] [place] [address] [phone]                Allergies  Allergen Reactions   Codeine Nausea And Vomiting    Hallucinations    Morphine And Codeine Nausea And Vomiting    Hallucinations    Amoxicillin Nausea Only    Consultations: [facility]   Procedures/Studies: ECHOCARDIOGRAM COMPLETE  Result Date: 01/16/2023    ECHOCARDIOGRAM REPORT   Patient Name:   DREANNA MEGGITT Date of Exam: 01/16/2023 Medical Rec #:  [id number]         Height:       66.0 in Accession #:    [id number]        Weight:       140.0 lb Date of Birth:  12-10-1933         BSA:          1.719 m Patient Age:    88 years          BP:           127/58 mmHg Patient Gender: F                 HR:           64 bpm. Exam Location:  Inpatient Procedure: 2D Echo, Color Doppler and Cardiac Doppler Indications:     I50.9* Heart failure (unspecified)  History:         Patient has prior history of Echocardiogram examinations, most                  recent 02/15/2022. CHF, Defibrillator, COPD, Arrythmias:Atrial                  Fibrillation; Risk Factors:Dyslipidemia and Hypertension.  Sonographer:     Irving Burton Senior RDCS Referring Phys:  [id number] Dolly Rias Diagnosing Phys: Freida Busman McleanMD IMPRESSIONS  1. Left ventricular ejection fraction, by estimation, is 65 to 70%. Left ventricular ejection fraction by PLAX is 60 %. The left ventricle has normal function. The left ventricle has no regional wall  motion abnormalities. There is mild concentric left ventricular hypertrophy. Left ventricular diastolic parameters are indeterminate.  2. D-shaped interventricular septum suggests RV pressure/volume overload. Right ventricular systolic function is mildly reduced. The right ventricular size is mildly enlarged. There is severely elevated pulmonary artery systolic pressure. The estimated  right ventricular systolic pressure is 103.0 mmHg.  3. Left atrial size was moderately dilated.  4. Right atrial size was severely dilated.  5. The mitral valve is degenerative. Mild mitral valve regurgitation. Moderate mitral stenosis. The mean mitral valve gradient is 6.0 mmHg with average heart rate of 65 bpm, MVA 1.52 cm^2 by VTI. Moderate mitral annular calcification.  6. The tricuspid valve is abnormal. Tricuspid valve regurgitation is severe.  7. The aortic valve is tricuspid. There is moderate calcification of the aortic valve. Aortic valve regurgitation is mild. Moderate aortic valve stenosis. Aortic valve area, by VTI measures 1.10 cm. Aortic valve mean gradient measures 17.0 mmHg. Aortic  valve Vmax measures 2.68 m/s.  8. The inferior vena cava is dilated in size with <50% respiratory variability, suggesting right atrial pressure of 15 mmHg.  9. The patient is in atrial fibrillation FINDINGS  Left Ventricle: Left ventricular ejection fraction, by estimation, is 65 to 70%. Left ventricular ejection fraction by PLAX is 60 %. The left ventricle has normal function. The left ventricle has no regional wall motion abnormalities. The left ventricular internal cavity size was normal in size. There is mild concentric left ventricular hypertrophy. Left ventricular diastolic function could not be evaluated due to mitral stenosis. Left ventricular diastolic parameters are indeterminate. Right Ventricle: D-shaped interventricular septum suggests RV pressure/volume overload. The right ventricular size is mildly enlarged. No increase in  right ventricular wall thickness. Right ventricular systolic function is mildly reduced. There is severely elevated pulmonary artery systolic pressure. The tricuspid regurgitant velocity is 4.69 m/s, and with an assumed right atrial pressure of 15 mmHg, the estimated right ventricular systolic pressure is 103.0 mmHg. Left Atrium: Left atrial size was moderately dilated. Right Atrium: Right atrial size was severely dilated. Pericardium: There is no evidence of pericardial effusion. Mitral Valve: The mitral valve is degenerative in appearance. There is mild calcification of the mitral valve leaflet(s). Moderate mitral annular calcification. Mild mitral valve regurgitation. Moderate mitral valve stenosis. MV peak gradient, 22.3 mmHg.  The mean mitral valve gradient is 6.0 mmHg with average heart rate of 65 bpm. Tricuspid Valve: The tricuspid valve is abnormal. Tricuspid valve regurgitation is severe. Aortic Valve: The aortic valve is tricuspid. There is moderate calcification of the aortic valve. Aortic valve regurgitation is mild. Moderate aortic stenosis is present. Aortic valve mean gradient measures 17.0 mmHg. Aortic valve peak gradient measures 28.7 mmHg. Aortic valve area, by VTI measures 1.10 cm. Pulmonic Valve: The pulmonic valve was normal in structure. Pulmonic valve regurgitation is mild. Aorta: The aortic root and ascending aorta are structurally normal, with no evidence of dilitation. Venous: The inferior vena cava is dilated in size with less than 50% respiratory variability, suggesting right atrial pressure of 15 mmHg. The inferior vena cava and the hepatic vein show a pattern of systolic flow reversal, suggestive of tricuspid regurgitation. IAS/Shunts: No atrial level shunt detected by color flow Doppler. Additional Comments: A device lead is visualized in the right atrium and right ventricle.  LEFT VENTRICLE PLAX 2D LV EF:         Left            Diastology                ventricular     LV e' medial:     6.74 cm/s                ejection        LV E/e' medial:  30.3  fraction by     LV e' lateral:   7.62 cm/s                PLAX is 60      LV E/e' lateral: 26.8                %. LVIDd:         3.20 cm LVIDs:         2.20 cm LV PW:         1.40 cm LV IVS:        1.20 cm LVOT diam:     1.80 cm LV SV:         71 LV SV Index:   41 LVOT Area:     2.54 cm  RIGHT VENTRICLE RV S prime:     11.40 cm/s TAPSE (M-mode): 1.6 cm LEFT ATRIUM             Index        RIGHT ATRIUM           Index LA diam:        4.80 cm 2.79 cm/m   RA Area:     32.90 cm LA Vol (A2C):   84.1 ml 48.94 ml/m  RA Volume:   131.00 ml 76.23 ml/m LA Vol (A4C):   64.5 ml 37.53 ml/m LA Biplane Vol: 73.5 ml 42.77 ml/m  AORTIC VALVE AV Area (Vmax):    1.14 cm AV Area (Vmean):   1.18 cm AV Area (VTI):     1.10 cm AV Vmax:           268.00 cm/s AV Vmean:          197.000 cm/s AV VTI:            0.641 m AV Peak Grad:      28.7 mmHg AV Mean Grad:      17.0 mmHg LVOT Vmax:         120.00 cm/s LVOT Vmean:        91.000 cm/s LVOT VTI:          0.278 m LVOT/AV VTI ratio: 0.43  AORTA Ao Root diam: 2.50 cm Ao Asc diam:  3.10 cm MITRAL VALVE                TRICUSPID VALVE MV Area (PHT): 4.36 cm     TV Peak grad:   19.7 mmHg MV Area VTI:   1.27 cm     TV Mean grad:   4.0 mmHg MV Peak grad:  22.3 mmHg    TV Vmax:        2.22 m/s MV Mean grad:  6.0 mmHg     TV Vmean:       81.0 cm/s MV Vmax:       2.36 m/s     TV VTI:         0.49 msec MV Vmean:      106.0 cm/s   TR Peak grad:   88.0 mmHg MV Decel Time: 174 msec     TR Vmax:        469.00 cm/s MV E velocity: 204.00 cm/s MV A velocity: 104.00 cm/s  SHUNTS MV E/A ratio:  1.96         Systemic VTI:  0.28 m                             Systemic  Diam: 1.80 cm Dalton McleanMD Electronically signed by Wilfred Lacy Signature Date/Time: 01/16/2023/3:06:18 PM    Final (Updated)    DG Chest Port 1 View  Result Date: 01/16/2023 CLINICAL DATA:  Shortness of breath. EXAM: PORTABLE CHEST 1 VIEW COMPARISON:   December 09, 2022 FINDINGS: There is stable multilead AICD positioning. The heart size and mediastinal contours are within normal limits. There is marked severity calcification of the aortic arch. Mild, diffuse, chronic appearing increased interstitial lung markings are seen. Mild, stable atelectasis is seen within the bilateral lung bases with small, stable bilateral pleural effusions. No pneumothorax is identified. No acute osseous abnormalities are identified. IMPRESSION: Stable, chronic appearing increased interstitial lung markings with mild bibasilar atelectasis and small bilateral pleural effusions. Electronically Signed   By: Aram Candela M.D.   On: 01/16/2023 01:19     Subjective: Patient seen examined bedside, resting calmly.  Sitting at edge of bed.  Eating breakfast.  Reports lower extremity edema improved.  Hemoglobin stable 8.0 this morning.  Discussed to maintain daily weight log on discharge and to follow-up with her PCP and subspecialist including oncology, GI and cardiology.  No other specific questions or concerns at this time.  Denies headache, no dizziness, no chest pain, no palpitations, no shortness of breath more than her typical baseline, no abdominal pain, no focal weakness, no paresthesia.  No acute events overnight per nurse staff.  Discharge Exam: Vitals:   01/18/23 0810 01/18/23 0850  BP:  (!) 119/49  Pulse: 63 (!) 122  Resp: 20 16  Temp:  97.7 F (36.5 C)  SpO2: 100% 100%   Vitals:   01/18/23 0018 01/18/23 0416 01/18/23 0810 01/18/23 0850  BP: (!) 113/44   (!) 119/49  Pulse:   63 (!) 122  Resp: 19 19 20 16   Temp: 98.3 F (36.8 C) 98.1 F (36.7 C)  97.7 F (36.5 C)  TempSrc: Oral Oral  Oral  SpO2:   100% 100%  Weight:  60.8 kg    Height:        Physical Exam: GEN: NAD, alert and oriented x 3, chronically ill in appearance HEENT: NCAT, PERRL, EOMI, sclera clear, MMM PULM: Breath sounds slightly diminished bilateral bases, no wheezing/crackles, normal  respiratory effort, on 2 L nasal cannula which is her baseline CV: RRR w/ 2/6 SEM, no rub/gallop GI: abd soft, NTND, NABS MSK: 3+ pitting edema bilateral lower extremities extending to knee with chronic venous changes, symmetric, moves all extremities independently with preserved muscle strength. NEURO: CN II-XII intact, no focal deficits, sensation to light touch intact PSYCH: normal mood/affect    The results of significant diagnostics from this hospitalization (including imaging, microbiology, ancillary and laboratory) are listed below for reference.     Microbiology: No results found for this or any previous visit (from the past 240 hour(s)).   Labs: BNP (last 3 results) Recent Labs    01/14/23 1425 01/15/23 2318 01/17/23 0405  BNP 388.0* 342.9* 584.4*   Basic Metabolic Panel: Recent Labs  Lab 01/14/23 1426 01/15/23 2318 01/16/23 0327 01/17/23 0405 01/18/23 0255  NA 139 136 137 136 138  K 4.9 4.4 4.0 3.9 3.8  CL 102 99 97* 97* 96*  CO2 32 28 29 28 31   GLUCOSE 75 191* 100* 134* 127*  BUN 46* 43* 42* 51* 57*  CREATININE 1.83* 1.81* 1.72* 1.78* 1.87*  CALCIUM 9.1 8.7* 8.8* 8.9 8.8*  MG  --   --  2.3 2.3  --   PHOS  --   --  4.0  --   --    Liver Function Tests: Recent Labs  Lab 01/14/23 1426  AST 12*  ALT 7  ALKPHOS 40  BILITOT 0.6  PROT 7.2  ALBUMIN 4.1   No results for input(s): "LIPASE", "AMYLASE" in the last 168 hours. No results for input(s): "AMMONIA" in the last 168 hours. CBC: Recent Labs  Lab 01/14/23 1426 01/15/23 2318 01/16/23 2119 01/17/23 0405 01/18/23 0255  WBC 4.1 3.9*  --  8.4 4.9  NEUTROABS 3.3 3.2  --   --   --   HGB 7.0* 6.8* 8.8* 9.0* 8.0*  HCT 23.9* 24.2* 29.5* 30.3* 27.4*  MCV 92.6 92.7  --  89.4 91.0  PLT 182 183  --  189 159   Cardiac Enzymes: No results for input(s): "CKTOTAL", "CKMB", "CKMBINDEX", "TROPONINI" in the last 168 hours. BNP: Invalid input(s): "POCBNP" CBG: Recent Labs  Lab 01/17/23 0634 01/17/23 1123  01/17/23 1622 01/17/23 2028 01/18/23 0549  GLUCAP 114* 117* 121* 119* 119*   D-Dimer No results for input(s): "DDIMER" in the last 72 hours. Hgb A1c No results for input(s): "HGBA1C" in the last 72 hours. Lipid Profile No results for input(s): "CHOL", "HDL", "LDLCALC", "TRIG", "CHOLHDL", "LDLDIRECT" in the last 72 hours. Thyroid function studies No results for input(s): "TSH", "T4TOTAL", "T3FREE", "THYROIDAB" in the last 72 hours.  Invalid input(s): "FREET3" Anemia work up Recent Labs    01/16/23 0829  VITAMINB12 709  FOLATE 32.5  FERRITIN 15  TIBC 340  IRON 36  RETICCTPCT 4.5*   Urinalysis    Component Value Date/Time   COLORURINE YELLOW 08/21/2022 1300   APPEARANCEUR CLEAR 08/21/2022 1300   APPEARANCEUR Cloudy (A) 01/27/2018 1330   LABSPEC 1.016 08/21/2022 1300   PHURINE 5.0 08/21/2022 1300   GLUCOSEU NEGATIVE 08/21/2022 1300   HGBUR NEGATIVE 08/21/2022 1300   HGBUR large 11/16/2009 1202   BILIRUBINUR NEGATIVE 08/21/2022 1300   BILIRUBINUR Negative 11/08/2021 1237   BILIRUBINUR Negative 01/27/2018 1330   KETONESUR NEGATIVE 08/21/2022 1300   PROTEINUR NEGATIVE 08/21/2022 1300   UROBILINOGEN 0.2 11/08/2021 1237   UROBILINOGEN 1.0 08/16/2014 1754   NITRITE NEGATIVE 08/21/2022 1300   LEUKOCYTESUR MODERATE (A) 08/21/2022 1300   Sepsis Labs Recent Labs  Lab 01/14/23 1426 01/15/23 2318 01/17/23 0405 01/18/23 0255  WBC 4.1 3.9* 8.4 4.9   Microbiology No results found for this or any previous visit (from the past 240 hour(s)).   Time coordinating discharge: Over 30 minutes  SIGNED:   Alvira Philips [place], DO  [facility] 01/18/2023, 9:25 AM

## 2023-01-18 NOTE — Plan of Care (Signed)
Pt able to get out of bed to bedside commode with no assistance

## 2023-01-18 NOTE — Discharge Instructions (Signed)
Please maintain a daily weight log and bring to her next PCP or cardiology visit

## 2023-01-20 ENCOUNTER — Telehealth: Payer: Self-pay | Admitting: *Deleted

## 2023-01-20 NOTE — Progress Notes (Unsigned)
Care Coordination Note  01/20/2023 Name: REXANNA TRUNNELL MRN: [id number] DOB: 08/05/1933  GIAVONNI MEISS is a 89 y.o. year old female who is a primary care patient of [name], MD and is actively engaged with the care management team. I reached out to Franklyn Lor by phone today to assist with re-scheduling an initial visit with the RN Case Manager  Follow up plan: Pt was d/c from hospital 9/14 - pt requests a call back at a later date says to reschedule   Burman Nieves, [facility] Care Coordination Care Guide Direct Dial: [phone]

## 2023-01-21 ENCOUNTER — Telehealth: Payer: Self-pay | Admitting: *Deleted

## 2023-01-21 ENCOUNTER — Ambulatory Visit: Payer: PPO | Admitting: Gastroenterology

## 2023-01-21 NOTE — Transitions of Care (Post Inpatient/ED Visit) (Signed)
01/21/2023  Name: [name] MRN: [id number] DOB: [birth year]  Today's TOC FU Call Status: Today's TOC FU Call Status:: Unsuccessful Call (1st Attempt) Unsuccessful Call (1st Attempt) Date: 01/21/23  Attempted to reach the patient regarding the most recent Inpatient/ED visit.  Follow Up Plan: Additional outreach attempts will be made to reach the patient to complete the Transitions of Care (Post Inpatient/ED visit) call.   Gean Maidens BSN RN [facility] [phone]

## 2023-01-23 ENCOUNTER — Telehealth: Payer: Self-pay | Admitting: *Deleted

## 2023-01-23 NOTE — Transitions of Care (Post Inpatient/ED Visit) (Signed)
01/23/2023  Name: [name] MRN: [id number] DOB: July 18, 1933  Today's TOC FU Call Status: Today's TOC FU Call Status:: Successful TOC FU Call Completed TOC FU Call Complete Date: 01/23/23 Patient's Name and Date of Birth confirmed.  Transition Care Management Follow-up Telephone Call Date of Discharge: 01/18/23 Discharge Facility: [facility]) Type of Discharge: Inpatient Admission Primary Inpatient Discharge Diagnosis:: Acute on chronic CHF with hypoxia- repiratory failure; symptomatic anemia How have you been since you were released from the hospital?: Same ("My legs are still swollen, but they told me it could take up to 2 weeks before that gets better.  I am taking the fluid pills like they told me to- 10 pills a day!  Taking my potassium with it like they told me to.") Any questions or concerns?: No  Items Reviewed: Did you receive and understand the discharge instructions provided?: Yes (thoroughly reviewed with patient who verbalizes good understanding of same) Medications obtained,verified, and reconciled?: Partial Review Completed (Partial review completed; confirmed patient obtained/ is taking all newly Rx'd medications as instructed; self-manages medications and denies questions/ concerns around medications today- states reviewed at time of hospital discharge) Reason for Partial Mediation Review: Patient declined- states medications were reviewed at time of hospital discharge and she "has no time" to review today Any new allergies since your discharge?: No Dietary orders reviewed?: Yes Type of Diet Ordered:: Heart Healthy/ low salt, "as much as possible" Do you have support at home?: Yes People in Home: grandchild(ren) Name of Support/Comfort Primary Source: Reports resides with great grandson/ independent in self-care activities; supportive local family members assists as/ if needed/ indicated - reports her daughter particpates in "a lot" of her care and "lives 5  minutes away"  Medications Reviewed Today: Medications Reviewed Today     Reviewed by Michaela Corner, RN (Registered Nurse) on 01/23/23 at 1341  Med List Status: <None>   Medication Order Taking? Sig Documenting Provider Last Dose Status Informant  acetaminophen (TYLENOL) 500 MG tablet [id number]  Take 1,000 mg by mouth every 8 (eight) hours as needed for mild pain or headache. [provider]  Active Self, Pharmacy Records  Cyanocobalamin (B-12 PO) [id number]  Take 1 tablet by mouth daily. [provider]  Active Self, Pharmacy Records           Med Note [facility], Ruthell Rummage   Tue Jun 27, 2020 12:32 PM)    ferrous gluconate (FERGON) 324 MG tablet [id number]  Take 1 tablet (324 mg total) by mouth daily with breakfast. Rai, Delene Ruffini, MD  Active Self, Pharmacy Records  fluticasone [facility]) 50 MCG/ACT nasal spray [id number]  Place 2 sprays into both nostrils daily as needed for allergies or rhinitis. [name], MD  Active Self, Pharmacy Records  pantoprazole (PROTONIX) 40 MG tablet [id number]  Take 1 tablet (40 mg total) by mouth daily. Rai, Delene Ruffini, MD  Active Self, Pharmacy Records  potassium chloride SA (KLOR-CON M) 20 MEQ tablet [id number] Yes Take 1 tablet (20 mEq total) by mouth 2 (two) times daily.  Patient taking differently: Take 20 mEq by mouth 2 (two) times daily. 40 meq in am and 20 meq   Rai, Delene Ruffini, MD Taking Active Self, Pharmacy Records           Med Note Jonnie Kind Jan 23, 2023  1:30 PM) 01/23/23: Reports during [facility] call she has been taking new dose as instructed post-hospital discharge; she declines full medication review- states "no  time, don't want to get into all that"   rosuvastatin (CRESTOR) 40 MG tablet [id number]  Take 1 tablet (40 mg total) by mouth daily.  Patient taking differently: Take 40 mg by mouth every evening.   [name], MD  Active Self, Pharmacy Records  torsemide [facility]) 20 MG tablet [id number] Yes  Take 5 tablets (100 mg total) by mouth 2 (two) times daily. [place], Eric J, DO Taking Active            Med Note Jonnie Kind Jan 23, 2023  1:29 PM) 01/23/23: Reports during [facility] call she has been taking new dose as instructed post-hospital discharge; she declines full medication review- states "no time, don't want to get into all that"  vitamin C (ASCORBIC ACID) 250 MG tablet [id number]  Take 500 mg by mouth daily. [provider]  Active Self, Pharmacy Records           Med Note Truddie Coco May 28, 2022  2:57 PM)    VITAMIN D PO [id number]  Take 4,000 Units by mouth daily. [provider]  Active Self, Pharmacy Records           Home Care and Equipment/Supplies: Were Home Health Services Ordered?: Yes Name of Home Health Agency:: [facility] Has Agency set up a time to come to your home?: No (Reports she was contacted by home health agency to establish services-- reports she declined; states today, "I don't need that, I don't want them coming out here to my home when my grandson is at work") Any new equipment or medical supplies ordered?: No  Functional Questionnaire: Do you need assistance with bathing/showering or dressing?: No Do you need assistance with meal preparation?: No Do you need assistance with eating?: No Do you have difficulty maintaining continence: No Do you need assistance with getting out of bed/getting out of a chair/moving?: No Do you have difficulty managing or taking your medications?: No  Follow up appointments reviewed: PCP Follow-up appointment confirmed?: No (declined assistance with scheduling PCP HFU: states, "I need to schedule my own appointments around my family's schedule" confirmed she has prompt HFU appointments with specialists currently scheduled- encouraged her to schedule with PCP promptly) MD Provider Line Number:[phone] Given: No (verified well-established with current PCP) Specialist Hospital  Follow-up appointment confirmed?: Yes Date of Specialist follow-up appointment?: 01/27/23 Follow-Up Specialty Provider:: 01/27/23: oncology provider; 01/30/23: CHF provider; 02/10/23: cardiology provider Do you need transportation to your follow-up appointment?: No Do you understand care options if your condition(s) worsen?: Yes-patient verbalized understanding  SDOH Interventions Today    Flowsheet Row Most Recent Value  SDOH Interventions   Food Insecurity Interventions Intervention Not Indicated  Transportation Interventions Intervention Not Indicated  [denies transportation needs today: reports her local/ supportive family members provide all transportation]      TOC Interventions Today    Flowsheet Row Most Recent Value  TOC Interventions   TOC Interventions Discussed/Reviewed TOC Interventions Discussed  [Offered to provide my direct contact information should questions/ concerns/ needs arise post-TOC call, prior to RN CM telephone visit - patient declines taking my contact information]      Interventions Today    Flowsheet Row Most Recent Value  Chronic Disease   Chronic disease during today's visit Congestive Heart Failure (CHF), Other  [chronic symptomatic anemia]  General Interventions   General Interventions Discussed/Reviewed General Interventions Discussed, Durable Medical Equipment (DME), Doctor Visits, Referral to Nurse, Communication with  [scheduled with RN  CM Care Coordinator for follow up telephone visit on 02/06/23]  Doctor Visits Discussed/Reviewed Doctor Visits Discussed, PCP, Specialist  [confirmed specialists appointments for HFU have been scheduled,  encouraged patient to schedule promptly with established PCP, she declines assistance with doing so today]  Durable Medical Equipment (DME) Oxygen  [confirmed on home O2 at baseline,  uses cane prn]  PCP/Specialist Visits Compliance with follow-up visit  Communication with RN  Exercise Interventions   Exercise  Discussed/Reviewed Exercise Discussed  [facility] services: patient reports she declined home health services when [facility] called to establish services]  Education Interventions   Education Provided Provided Education  Provided Verbal Education On Medication, Other, When to see the doctor  [importance of taking diuretics and supplemental potassium as instructed post-hospital discharge,  reinforced safe use/ maintenance of home O2]  Nutrition Interventions   Nutrition Discussed/Reviewed Nutrition Discussed  Pharmacy Interventions   Pharmacy Dicussed/Reviewed Pharmacy Topics Discussed  Safety Interventions   Safety Discussed/Reviewed Safety Discussed, Fall Risk      Caryl Pina, RN, BSN, CCRN Alumnus RN CM Care Coordination/ Transition of Care- [facility] Care Management [phone]: direct office

## 2023-01-23 NOTE — Progress Notes (Signed)
Per TOC nurse, pt agreeable to be rescheduled with RN. TOC nurse scheduled the appt

## 2023-01-26 NOTE — Assessment & Plan Note (Deleted)
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   -Outpatient EUS 03/04/2022 by Dr. 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 Freida Busman did not recommend surgery in multifocal NET and also due to her age and co-morbidities. -She began first line sandostatin injections q28 days on 03/27/22.  -Dotatate PET scan, done 2 weeks after first injection 12/6, shows no significant tracer avidity in the pancrease or distant sites. Findings are likely due to close timing relative to the sandostatin injection blocking receptors. When we repeat this for restaging next year, will arrange 4 weeks after sandostatin -She has persistent anemia, likely secondary to GI bleeding from the tumor.   -she was recently hospitalized on 01/15/2023 for CHF exacerbation and anemia which required blood transfusion

## 2023-01-27 ENCOUNTER — Telehealth: Payer: Self-pay | Admitting: Hematology

## 2023-01-27 ENCOUNTER — Inpatient Hospital Stay: Payer: PPO | Admitting: Hematology

## 2023-01-27 ENCOUNTER — Inpatient Hospital Stay: Payer: PPO

## 2023-01-27 DIAGNOSIS — J449 Chronic obstructive pulmonary disease, unspecified: Secondary | ICD-10-CM | POA: Diagnosis not present

## 2023-01-27 DIAGNOSIS — I5033 Acute on chronic diastolic (congestive) heart failure: Secondary | ICD-10-CM | POA: Diagnosis not present

## 2023-01-27 DIAGNOSIS — D3A8 Other benign neuroendocrine tumors: Secondary | ICD-10-CM

## 2023-01-28 ENCOUNTER — Ambulatory Visit: Payer: PPO

## 2023-01-28 ENCOUNTER — Other Ambulatory Visit: Payer: PPO

## 2023-01-28 ENCOUNTER — Ambulatory Visit: Payer: PPO | Admitting: Hematology

## 2023-01-30 ENCOUNTER — Inpatient Hospital Stay (HOSPITAL_COMMUNITY)
Admission: EM | Admit: 2023-01-30 | Discharge: 2023-02-06 | DRG: 811 | Disposition: A | Discharge status: Home / Self Care | Payer: PPO | Attending: Internal Medicine | Admitting: Internal Medicine

## 2023-01-30 ENCOUNTER — Emergency Department (HOSPITAL_COMMUNITY): Payer: PPO

## 2023-01-30 ENCOUNTER — Encounter (HOSPITAL_COMMUNITY): Payer: Self-pay

## 2023-01-30 ENCOUNTER — Other Ambulatory Visit: Payer: Self-pay

## 2023-01-30 ENCOUNTER — Ambulatory Visit (HOSPITAL_BASED_OUTPATIENT_CLINIC_OR_DEPARTMENT_OTHER)
Admission: RE | Admit: 2023-01-30 | Discharge: 2023-01-30 | Disposition: A | Discharge status: Home / Self Care | Payer: PPO | Source: Ambulatory Visit | Attending: Physician Assistant | Admitting: Physician Assistant

## 2023-01-30 VITALS — BP 134/54 | HR 61 | Wt 139.6 lb

## 2023-01-30 DIAGNOSIS — D649 Anemia, unspecified: Secondary | ICD-10-CM | POA: Diagnosis not present

## 2023-01-30 DIAGNOSIS — I4821 Permanent atrial fibrillation: Secondary | ICD-10-CM | POA: Insufficient documentation

## 2023-01-30 DIAGNOSIS — D62 Acute posthemorrhagic anemia: Secondary | ICD-10-CM | POA: Diagnosis not present

## 2023-01-30 DIAGNOSIS — D500 Iron deficiency anemia secondary to blood loss (chronic): Secondary | ICD-10-CM | POA: Diagnosis present

## 2023-01-30 DIAGNOSIS — N179 Acute kidney failure, unspecified: Secondary | ICD-10-CM | POA: Diagnosis present

## 2023-01-30 DIAGNOSIS — Z9981 Dependence on supplemental oxygen: Secondary | ICD-10-CM

## 2023-01-30 DIAGNOSIS — R54 Age-related physical debility: Secondary | ICD-10-CM | POA: Diagnosis not present

## 2023-01-30 DIAGNOSIS — I10 Essential (primary) hypertension: Secondary | ICD-10-CM | POA: Diagnosis not present

## 2023-01-30 DIAGNOSIS — K921 Melena: Principal | ICD-10-CM

## 2023-01-30 DIAGNOSIS — J9611 Chronic respiratory failure with hypoxia: Secondary | ICD-10-CM | POA: Diagnosis not present

## 2023-01-30 DIAGNOSIS — I272 Pulmonary hypertension, unspecified: Secondary | ICD-10-CM | POA: Diagnosis present

## 2023-01-30 DIAGNOSIS — K922 Gastrointestinal hemorrhage, unspecified: Secondary | ICD-10-CM | POA: Diagnosis not present

## 2023-01-30 DIAGNOSIS — N184 Chronic kidney disease, stage 4 (severe): Secondary | ICD-10-CM | POA: Insufficient documentation

## 2023-01-30 DIAGNOSIS — Z8616 Personal history of COVID-19: Secondary | ICD-10-CM | POA: Diagnosis not present

## 2023-01-30 DIAGNOSIS — Z880 Allergy status to penicillin: Secondary | ICD-10-CM

## 2023-01-30 DIAGNOSIS — Z7189 Other specified counseling: Secondary | ICD-10-CM | POA: Diagnosis not present

## 2023-01-30 DIAGNOSIS — I428 Other cardiomyopathies: Secondary | ICD-10-CM | POA: Insufficient documentation

## 2023-01-30 DIAGNOSIS — N183 Chronic kidney disease, stage 3 unspecified: Secondary | ICD-10-CM | POA: Diagnosis present

## 2023-01-30 DIAGNOSIS — C7A8 Other malignant neuroendocrine tumors: Secondary | ICD-10-CM | POA: Insufficient documentation

## 2023-01-30 DIAGNOSIS — I5043 Acute on chronic combined systolic (congestive) and diastolic (congestive) heart failure: Secondary | ICD-10-CM | POA: Diagnosis present

## 2023-01-30 DIAGNOSIS — K551 Chronic vascular disorders of intestine: Secondary | ICD-10-CM | POA: Diagnosis present

## 2023-01-30 DIAGNOSIS — J439 Emphysema, unspecified: Secondary | ICD-10-CM | POA: Diagnosis not present

## 2023-01-30 DIAGNOSIS — Z96643 Presence of artificial hip joint, bilateral: Secondary | ICD-10-CM | POA: Diagnosis not present

## 2023-01-30 DIAGNOSIS — E7800 Pure hypercholesterolemia, unspecified: Secondary | ICD-10-CM | POA: Diagnosis present

## 2023-01-30 DIAGNOSIS — Z95810 Presence of automatic (implantable) cardiac defibrillator: Secondary | ICD-10-CM | POA: Diagnosis not present

## 2023-01-30 DIAGNOSIS — I6522 Occlusion and stenosis of left carotid artery: Secondary | ICD-10-CM | POA: Diagnosis not present

## 2023-01-30 DIAGNOSIS — J849 Interstitial pulmonary disease, unspecified: Secondary | ICD-10-CM | POA: Diagnosis not present

## 2023-01-30 DIAGNOSIS — G459 Transient cerebral ischemic attack, unspecified: Secondary | ICD-10-CM | POA: Diagnosis not present

## 2023-01-30 DIAGNOSIS — I447 Left bundle-branch block, unspecified: Secondary | ICD-10-CM | POA: Diagnosis present

## 2023-01-30 DIAGNOSIS — J449 Chronic obstructive pulmonary disease, unspecified: Secondary | ICD-10-CM | POA: Diagnosis present

## 2023-01-30 DIAGNOSIS — Z905 Acquired absence of kidney: Secondary | ICD-10-CM

## 2023-01-30 DIAGNOSIS — Z8673 Personal history of transient ischemic attack (TIA), and cerebral infarction without residual deficits: Secondary | ICD-10-CM

## 2023-01-30 DIAGNOSIS — R160 Hepatomegaly, not elsewhere classified: Secondary | ICD-10-CM | POA: Diagnosis not present

## 2023-01-30 DIAGNOSIS — I130 Hypertensive heart and chronic kidney disease with heart failure and stage 1 through stage 4 chronic kidney disease, or unspecified chronic kidney disease: Secondary | ICD-10-CM | POA: Diagnosis not present

## 2023-01-30 DIAGNOSIS — N1832 Chronic kidney disease, stage 3b: Secondary | ICD-10-CM | POA: Diagnosis not present

## 2023-01-30 DIAGNOSIS — Z90710 Acquired absence of both cervix and uterus: Secondary | ICD-10-CM

## 2023-01-30 DIAGNOSIS — I5032 Chronic diastolic (congestive) heart failure: Secondary | ICD-10-CM

## 2023-01-30 DIAGNOSIS — R0989 Other specified symptoms and signs involving the circulatory and respiratory systems: Secondary | ICD-10-CM | POA: Diagnosis not present

## 2023-01-30 DIAGNOSIS — Z66 Do not resuscitate: Secondary | ICD-10-CM | POA: Diagnosis present

## 2023-01-30 DIAGNOSIS — I350 Nonrheumatic aortic (valve) stenosis: Secondary | ICD-10-CM | POA: Insufficient documentation

## 2023-01-30 DIAGNOSIS — I5033 Acute on chronic diastolic (congestive) heart failure: Secondary | ICD-10-CM | POA: Diagnosis present

## 2023-01-30 DIAGNOSIS — Z8619 Personal history of other infectious and parasitic diseases: Secondary | ICD-10-CM

## 2023-01-30 DIAGNOSIS — Z85528 Personal history of other malignant neoplasm of kidney: Secondary | ICD-10-CM

## 2023-01-30 DIAGNOSIS — I083 Combined rheumatic disorders of mitral, aortic and tricuspid valves: Secondary | ICD-10-CM | POA: Diagnosis present

## 2023-01-30 DIAGNOSIS — Z8249 Family history of ischemic heart disease and other diseases of the circulatory system: Secondary | ICD-10-CM

## 2023-01-30 DIAGNOSIS — Z886 Allergy status to analgesic agent status: Secondary | ICD-10-CM

## 2023-01-30 DIAGNOSIS — Z803 Family history of malignant neoplasm of breast: Secondary | ICD-10-CM

## 2023-01-30 DIAGNOSIS — Z85828 Personal history of other malignant neoplasm of skin: Secondary | ICD-10-CM

## 2023-01-30 DIAGNOSIS — D3A8 Other benign neuroendocrine tumors: Secondary | ICD-10-CM | POA: Diagnosis not present

## 2023-01-30 DIAGNOSIS — R0602 Shortness of breath: Secondary | ICD-10-CM | POA: Diagnosis not present

## 2023-01-30 DIAGNOSIS — K5520 Angiodysplasia of colon without hemorrhage: Secondary | ICD-10-CM

## 2023-01-30 DIAGNOSIS — J4489 Other specified chronic obstructive pulmonary disease: Secondary | ICD-10-CM | POA: Diagnosis present

## 2023-01-30 DIAGNOSIS — K5521 Angiodysplasia of colon with hemorrhage: Secondary | ICD-10-CM | POA: Diagnosis present

## 2023-01-30 DIAGNOSIS — Z79899 Other long term (current) drug therapy: Secondary | ICD-10-CM

## 2023-01-30 DIAGNOSIS — Z9049 Acquired absence of other specified parts of digestive tract: Secondary | ICD-10-CM

## 2023-01-30 DIAGNOSIS — Z515 Encounter for palliative care: Secondary | ICD-10-CM | POA: Diagnosis not present

## 2023-01-30 DIAGNOSIS — Z87891 Personal history of nicotine dependence: Secondary | ICD-10-CM

## 2023-01-30 DIAGNOSIS — Z888 Allergy status to other drugs, medicaments and biological substances status: Secondary | ICD-10-CM

## 2023-01-30 DIAGNOSIS — Z87440 Personal history of urinary (tract) infections: Secondary | ICD-10-CM

## 2023-01-30 DIAGNOSIS — N189 Chronic kidney disease, unspecified: Secondary | ICD-10-CM | POA: Diagnosis present

## 2023-01-30 DIAGNOSIS — J90 Pleural effusion, not elsewhere classified: Secondary | ICD-10-CM | POA: Diagnosis not present

## 2023-01-30 DIAGNOSIS — I2510 Atherosclerotic heart disease of native coronary artery without angina pectoris: Secondary | ICD-10-CM | POA: Diagnosis present

## 2023-01-30 DIAGNOSIS — Z885 Allergy status to narcotic agent status: Secondary | ICD-10-CM

## 2023-01-30 DIAGNOSIS — I517 Cardiomegaly: Secondary | ICD-10-CM | POA: Diagnosis not present

## 2023-01-30 LAB — TYPE AND SCREEN
Unit division: 0
Unit division: 0

## 2023-01-30 LAB — I-STAT CHEM 8, ED
BUN: 52 mg/dL — ABNORMAL HIGH (ref 8–23)
Calcium, Ion: 1.09 mmol/L — ABNORMAL LOW (ref 1.15–1.40)
Chloride: 100 mmol/L (ref 98–111)
Creatinine, Ser: 1.7 mg/dL — ABNORMAL HIGH (ref 0.44–1.00)
Glucose, Bld: 101 mg/dL — ABNORMAL HIGH (ref 70–99)
HCT: 23 % — ABNORMAL LOW (ref 36.0–46.0)
Hemoglobin: 7.8 g/dL — ABNORMAL LOW (ref 12.0–15.0)
Potassium: 4.4 mmol/L (ref 3.5–5.1)
Sodium: 137 mmol/L (ref 135–145)
TCO2: 27 mmol/L (ref 22–32)

## 2023-01-30 LAB — BPAM RBC
Blood Product Expiration Date: 202410242359
Blood Product Expiration Date: 202410242359
Unit Type and Rh: 600
Unit Type and Rh: 600

## 2023-01-30 LAB — CBC
HCT: 23.1 % — ABNORMAL LOW (ref 36.0–46.0)
Hemoglobin: 6.6 g/dL — CL (ref 12.0–15.0)
MCH: 27.5 pg (ref 26.0–34.0)
MCHC: 28.6 g/dL — ABNORMAL LOW (ref 30.0–36.0)
MCV: 96.3 fL (ref 80.0–100.0)
Platelets: 175 10*3/uL (ref 150–400)
RBC: 2.4 MIL/uL — ABNORMAL LOW (ref 3.87–5.11)
RDW: 19.8 % — ABNORMAL HIGH (ref 11.5–15.5)
WBC: 4.6 10*3/uL (ref 4.0–10.5)
nRBC: 0 % (ref 0.0–0.2)

## 2023-01-30 LAB — COMPREHENSIVE METABOLIC PANEL
ALT: 10 U/L (ref 0–44)
AST: 16 U/L (ref 15–41)
Albumin: 3.6 g/dL (ref 3.5–5.0)
Alkaline Phosphatase: 38 U/L (ref 38–126)
Anion gap: 11 (ref 5–15)
BUN: 53 mg/dL — ABNORMAL HIGH (ref 8–23)
CO2: 27 mmol/L (ref 22–32)
Calcium: 8.7 mg/dL — ABNORMAL LOW (ref 8.9–10.3)
Chloride: 98 mmol/L (ref 98–111)
Creatinine, Ser: 1.62 mg/dL — ABNORMAL HIGH (ref 0.44–1.00)
GFR, Estimated: 31 mL/min — ABNORMAL LOW (ref >60)
Glucose, Bld: 104 mg/dL — ABNORMAL HIGH (ref 70–99)
Potassium: 4.4 mmol/L (ref 3.5–5.1)
Sodium: 136 mmol/L (ref 135–145)
Total Bilirubin: 0.9 mg/dL (ref 0.3–1.2)
Total Protein: 6.9 g/dL (ref 6.5–8.1)

## 2023-01-30 LAB — PROTIME-INR
INR: 1.1 (ref 0.8–1.2)
Prothrombin Time: 14.5 seconds (ref 11.4–15.2)

## 2023-01-30 LAB — PREPARE RBC (CROSSMATCH)

## 2023-01-30 LAB — POC OCCULT BLOOD, ED: Fecal Occult Bld: POSITIVE — AB

## 2023-01-30 LAB — TROPONIN I (HIGH SENSITIVITY)
Troponin I (High Sensitivity): 32 ng/L — ABNORMAL HIGH (ref <18)
Troponin I (High Sensitivity): 28 ng/L — ABNORMAL HIGH (ref <18)

## 2023-01-30 LAB — BRAIN NATRIURETIC PEPTIDE: B Natriuretic Peptide: 322.7 pg/mL — ABNORMAL HIGH (ref 0.0–100.0)

## 2023-01-30 MED ORDER — FAMOTIDINE IN NACL 20-0.9 MG/50ML-% IV SOLN
20.0000 mg | Freq: Once | INTRAVENOUS | Status: AC
  Administered 2023-01-31: 20 mg via INTRAVENOUS
  Filled 2023-01-30: qty 50

## 2023-01-30 MED ORDER — SODIUM CHLORIDE 0.9% FLUSH
3.0000 mL | Freq: Two times a day (BID) | INTRAVENOUS | Status: DC
  Administered 2023-01-30 – 2023-02-06 (×14): 3 mL via INTRAVENOUS

## 2023-01-30 MED ORDER — PANTOPRAZOLE SODIUM 40 MG PO TBEC
40.0000 mg | DELAYED_RELEASE_TABLET | Freq: Every day | ORAL | Status: DC
  Administered 2023-01-31 – 2023-02-06 (×7): 40 mg via ORAL
  Filled 2023-01-30 (×7): qty 1

## 2023-01-30 MED ORDER — LACTATED RINGERS IV BOLUS
1000.0000 mL | Freq: Once | INTRAVENOUS | Status: AC
  Administered 2023-01-30: 1000 mL via INTRAVENOUS

## 2023-01-30 MED ORDER — ONDANSETRON HCL 4 MG PO TABS: 4.0000 mg | ORAL_TABLET | Freq: Four times a day (QID) | ORAL | Status: DC | PRN

## 2023-01-30 MED ORDER — ACETAMINOPHEN 650 MG RE SUPP: 650.0000 mg | Freq: Four times a day (QID) | RECTAL | Status: DC | PRN

## 2023-01-30 MED ORDER — ROSUVASTATIN CALCIUM 20 MG PO TABS
40.0000 mg | ORAL_TABLET | Freq: Every evening | ORAL | Status: DC
  Administered 2023-01-30 – 2023-02-05 (×7): 40 mg via ORAL
  Filled 2023-01-30 (×7): qty 2

## 2023-01-30 MED ORDER — FUROSEMIDE 10 MG/ML IJ SOLN
80.0000 mg | Freq: Once | INTRAMUSCULAR | Status: AC
  Administered 2023-01-30: 80 mg via INTRAVENOUS
  Filled 2023-01-30: qty 8

## 2023-01-30 MED ORDER — ACETAMINOPHEN 325 MG PO TABS
650.0000 mg | ORAL_TABLET | Freq: Four times a day (QID) | ORAL | Status: DC | PRN
  Administered 2023-01-31 – 2023-02-05 (×4): 650 mg via ORAL
  Filled 2023-01-30 (×5): qty 2

## 2023-01-30 MED ORDER — SENNOSIDES-DOCUSATE SODIUM 8.6-50 MG PO TABS: 1.0000 | ORAL_TABLET | Freq: Every evening | ORAL | Status: DC | PRN

## 2023-01-30 MED ORDER — PANTOPRAZOLE 80MG IVPB - SIMPLE MED
80.0000 mg | Freq: Once | INTRAVENOUS | Status: AC
  Administered 2023-01-30: 80 mg via INTRAVENOUS
  Filled 2023-01-30 (×2): qty 100

## 2023-01-30 MED ORDER — METHYLPREDNISOLONE SODIUM SUCC 125 MG IJ SOLR
125.0000 mg | Freq: Every day | INTRAMUSCULAR | Status: DC
  Administered 2023-01-30: 125 mg via INTRAVENOUS
  Filled 2023-01-30: qty 2

## 2023-01-30 MED ORDER — SODIUM CHLORIDE 0.9% IV SOLUTION: Freq: Once | INTRAVENOUS | Status: AC

## 2023-01-30 MED ORDER — ONDANSETRON HCL 4 MG/2ML IJ SOLN: 4.0000 mg | Freq: Four times a day (QID) | INTRAMUSCULAR | Status: DC | PRN

## 2023-01-30 MED ORDER — POTASSIUM CHLORIDE CRYS ER 20 MEQ PO TBCR
40.0000 meq | EXTENDED_RELEASE_TABLET | Freq: Every morning | ORAL | Status: DC
  Administered 2023-01-31 – 2023-02-05 (×6): 40 meq via ORAL
  Filled 2023-01-30 (×5): qty 2

## 2023-01-30 MED ORDER — POTASSIUM CHLORIDE CRYS ER 20 MEQ PO TBCR
20.0000 meq | EXTENDED_RELEASE_TABLET | Freq: Every evening | ORAL | Status: DC
  Administered 2023-01-30 – 2023-02-04 (×6): 20 meq via ORAL
  Filled 2023-01-30 (×7): qty 1

## 2023-01-30 MED ORDER — POTASSIUM CHLORIDE CRYS ER 20 MEQ PO TBCR: 20.0000 meq | EXTENDED_RELEASE_TABLET | ORAL | Status: DC

## 2023-01-30 MED ORDER — ACETAMINOPHEN 500 MG PO TABS
500.0000 mg | ORAL_TABLET | Freq: Once | ORAL | Status: AC
  Administered 2023-01-30: 500 mg via ORAL
  Filled 2023-01-30: qty 1

## 2023-01-30 MED ORDER — TORSEMIDE 100 MG PO TABS
100.0000 mg | ORAL_TABLET | Freq: Two times a day (BID) | ORAL | Status: DC
  Administered 2023-01-31: 100 mg via ORAL
  Filled 2023-01-30: qty 1

## 2023-01-30 MED ORDER — ACETAMINOPHEN 325 MG PO TABS
975.0000 mg | ORAL_TABLET | Freq: Once | ORAL | Status: AC | PRN
  Administered 2023-01-30: 975 mg via ORAL
  Filled 2023-01-30: qty 3

## 2023-01-30 MED ORDER — DIPHENHYDRAMINE HCL 25 MG PO CAPS
50.0000 mg | ORAL_CAPSULE | Freq: Once | ORAL | Status: AC
  Administered 2023-01-30: 50 mg via ORAL
  Filled 2023-01-30: qty 2

## 2023-01-30 NOTE — ED Notes (Signed)
ED TO INPATIENT HANDOFF REPORT  ED Nurse Name and Phone #: Randall Hiss [phone]  S Name/Age/Gender [name] 89 y.o. female Room/Bed: 021C/021C  Code Status   Code Status: Prior  Home/SNF/Other Home Patient oriented to: self, place, time, and situation Is this baseline? Yes   Triage Complete: Triage complete  Chief Complaint Symptomatic anemia [D64.9]  Triage Note Pt c/o int black stoolx2wks. Pt states the black stool is worse this week. Pt c/o dizziness, SOB and weakness. Pt wears 2-3L 02 per [place] at baseline. Pt denies blood thinner.   Allergies Allergies  Allergen Reactions   Codeine Nausea And Vomiting    Hallucinations    Amoxil [Amoxicillin] Nausea Only   Asa [Aspirin] Other (See Comments)    Told to avoid due to reduced kidney function, has 1 remaining kidney.   Ms Contin [Morphine] Nausea And Vomiting and Other (See Comments)    Hallucinations    Neurontin [Gabapentin] Other (See Comments)    "Out of it"   Nsaids Other (See Comments)    Told to avoid due to reduced kidney function, has 1 remaining kidney.    Level of Care/Admitting Diagnosis ED Disposition     ED Disposition  Admit   Condition  --   Comment  Hospital Area: [facility] [[id number]]  Level of Care: Progressive [102]  Admit to Progressive based on following criteria: CARDIOVASCULAR & THORACIC of moderate stability with acute coronary syndrome symptoms/low risk myocardial infarction/hypertensive urgency/arrhythmias/heart failure potentially compromising stability and stable post cardiovascular intervention patients.  Admit to Progressive based on following criteria: GI, ENDOCRINE disease patients with GI bleeding, acute liver failure or pancreatitis, stable with diabetic ketoacidosis or thyrotoxicosis (hypothyroid) state.  May admit patient to [facility] or [facility] if equivalent level of care is available:: No  Covid Evaluation: Asymptomatic - no recent exposure  (last 10 days) testing not required  Diagnosis: Symptomatic anemia [[id number]]  Admitting Physician: Charlsie Quest [[id number]]  Attending Physician: Charlsie Quest [[id number]]  Certification:: I certify this patient will need inpatient services for at least 2 midnights  Expected Medical Readiness: 02/01/2023          B Medical/Surgery History Past Medical History:  Diagnosis Date   (HFpEF) heart failure with preserved ejection fraction (HCC)    EF=60-60%   Actinic keratosis 01/17/2015   R forearm   Adjustment disorder with anxiety    Adverse effect of other narcotics, sequela    Intolerance to all narcotics   Anti-Duffy antibodies present    Aortic valve stenosis    Arthritis    "some in my hands" (11/11/2012)   Atrial fibrillation, permanent (HCC)    Eliquis   Atypical mole 03/25/2018   L forearm - severe   Automatic implantable cardioverter-defibrillator in situ    Avascular necrosis of hip (HCC) 05/03/2011   Carotid artery stenosis 09/2007   a. 09/2007: 60-79% bilateral (stable); b. 10/2008: 40-59% R 60-79%    Cellulitis of left lower extremity 07/13/2020   CKD (chronic kidney disease), stage III (HCC)    COPD (chronic obstructive pulmonary disease) (HCC)    Coronary artery disease    non-obstructive by 2006 cath   COVID-19 virus infection 02/12/2022   Displaced fracture of left femoral neck (HCC) 10/09/2018   GI bleed 03/28/2020   AVM   High cholesterol    Hypertension 05/20/2011   Hypertr obst cardiomyop    Hypotension, unspecified    cardiac cath 2006..nonobstructive CAD 30-40s lesions.Marland KitchenETT 1/09 nondiagnostic  due to poor HR response..Right Renal Cancer 2003   Iron deficiency anemia    Long term (current) use of anticoagulants    On home oxygen therapy    3L [place]   Osteoarthritis of right hip    PAD (peripheral artery disease) (HCC)    Pancreatic cancer (HCC)    sandostatin   PONV (postoperative nausea and vomiting)    Presence of permanent cardiac pacemaker     Pulmonary HTN (HCC)    RECTAL BLEEDING 10/13/2009   Qualifier: Diagnosis of  By: Wilmon Pali NP, Paula     Red blood cell antibody positive with compatible PRBC difficult to obtain    Anti FYA (Duffy a) antibody. Must be transfused with PRB which are Duffy Antigen Negative and Crossmatch Compatible   Renal cell carcinoma (HCC)    s/p nephrectomy   Squamous cell carcinoma of skin 01/17/2015   R lat wrist   Squamous cell carcinoma of skin 01/29/2018   R post upper leg - superficially invasive   Squamous cell carcinoma of skin 03/25/2018   L lat foot   Squamous cell carcinoma of skin 11/09/2020   left lat foot - EDC 01/01/21, recurrent 01/31/21 - MOHs 02/22/21   Squamous cell carcinoma of skin 12/19/2021   Right Posterior Medial Thigh, EDC   Squamous cell carcinoma of skin 12/19/2021   SCC IS, L lat heel, EDC 02/04/2022   Squamous cell carcinoma of skin 01/21/2022   L forearm, EDC 02/04/2022   Squamous cell carcinoma of skin 01/21/2022   R lower leg below knee, EDC 02/04/2022   Squamous cell carcinoma of skin 01/21/2022   SCCIS, R post heel, EDC 02/04/2022   Squamous cell carcinoma of skin 07/01/2022   left lower abdomen, in situ, EDC   Squamous cell carcinoma of skin 07/01/2022   left medial chest, EDC   Urge incontinence    Venous stasis of both lower extremities    Past Surgical History:  Procedure Laterality Date   ABDOMINAL AORTOGRAM W/LOWER EXTREMITY N/A 02/12/2021   Procedure: ABDOMINAL AORTOGRAM W/LOWER EXTREMITY;  Surgeon: Maeola Harman, MD;  Location: [facility] INVASIVE CV LAB;  Service: Cardiovascular;  Laterality: N/A;   ABDOMINAL HYSTERECTOMY  1975   for benign causes   APPENDECTOMY     BI-VENTRICULAR PACEMAKER UPGRADE  05/04/2010   BIOPSY  02/28/2019   Procedure: BIOPSY;  Surgeon: Rachael Fee, MD;  Location: [facility] ENDOSCOPY;  Service: Endoscopy;;   BIOPSY  03/04/2022   Procedure: BIOPSY;  Surgeon: Lemar Lofty., MD;  Location: [facility] ENDOSCOPY;  Service:  Gastroenterology;;   CARDIAC CATHETERIZATION  2006   CARDIOVERSION N/A 02/20/2018   Procedure: CARDIOVERSION;  Surgeon: Antonieta Iba, MD;  Location: [facility] ORS;  Service: Cardiovascular;  Laterality: N/A;   CARDIOVERSION N/A 03/27/2018   Procedure: CARDIOVERSION;  Surgeon: Antonieta Iba, MD;  Location: [facility] ORS;  Service: Cardiovascular;  Laterality: N/A;   CATARACT EXTRACTION W/ INTRAOCULAR LENS  IMPLANT, BILATERAL  01/2006-02-2006   CHOLECYSTECTOMY N/A 11/11/2012   Procedure: LAPAROSCOPIC CHOLECYSTECTOMY WITH INTRAOPERATIVE CHOLANGIOGRAM;  Surgeon: Wilmon Arms. Corliss Skains, MD;  Location: [facility] OR;  Service: General;  Laterality: N/A;   COLONOSCOPY WITH PROPOFOL N/A 02/28/2019   Procedure: COLONOSCOPY WITH PROPOFOL;  Surgeon: Rachael Fee, MD;  Location: [facility] ENDOSCOPY;  Service: Endoscopy;  Laterality: N/A;   ENTEROSCOPY N/A 03/30/2020   Procedure: ENTEROSCOPY;  Surgeon: Shellia Cleverly, DO;  Location: [facility] ENDOSCOPY;  Service: Gastroenterology;  Laterality: N/A;   ENTEROSCOPY N/A 02/10/2021   Procedure: ENTEROSCOPY;  Surgeon: Jeani Hawking, MD;  Location: [facility] ENDOSCOPY;  Service: Endoscopy;  Laterality: N/A;   ENTEROSCOPY N/A 02/15/2022   Procedure: ENTEROSCOPY;  Surgeon: Imogene Burn, MD;  Location: [facility] ENDOSCOPY;  Service: Gastroenterology;  Laterality: N/A;   ENTEROSCOPY N/A 11/06/2022   Procedure: ENTEROSCOPY;  Surgeon: Meridee Score Netty Starring., MD;  Location: [facility] ENDOSCOPY;  Service: Gastroenterology;  Laterality: N/A;   EP IMPLANTABLE DEVICE N/A 02/21/2016   Procedure: ICD Generator Changeout;  Surgeon: Duke Salvia, MD;  Location: [facility] INVASIVE CV LAB;  Service: Cardiovascular;  Laterality: N/A;   ESOPHAGOGASTRODUODENOSCOPY N/A 03/04/2022   Procedure: ESOPHAGOGASTRODUODENOSCOPY (EGD);  Surgeon: Lemar Lofty., MD;  Location: [facility] ENDOSCOPY;  Service: Gastroenterology;  Laterality: N/A;   ESOPHAGOGASTRODUODENOSCOPY (EGD) WITH PROPOFOL N/A 02/28/2019   Procedure:  ESOPHAGOGASTRODUODENOSCOPY (EGD) WITH PROPOFOL;  Surgeon: Rachael Fee, MD;  Location: [facility] ENDOSCOPY;  Service: Endoscopy;  Laterality: N/A;   EUS N/A 03/04/2022   Procedure: UPPER ENDOSCOPIC ULTRASOUND (EUS) RADIAL;  Surgeon: Lemar Lofty., MD;  Location: [facility] ENDOSCOPY;  Service: Gastroenterology;  Laterality: N/A;   FINE NEEDLE ASPIRATION N/A 03/04/2022   Procedure: FINE NEEDLE ASPIRATION (FNA) LINEAR;  Surgeon: Lemar Lofty., MD;  Location: [facility] ENDOSCOPY;  Service: Gastroenterology;  Laterality: N/A;   GIVENS CAPSULE STUDY N/A 03/15/2019   Procedure: GIVENS CAPSULE STUDY;  Surgeon: Tressia Danas, MD;  Location: [facility] ENDOSCOPY;  Service: Gastroenterology;  Laterality: N/A;   HOT HEMOSTASIS N/A 03/30/2020   Procedure: HOT HEMOSTASIS (ARGON PLASMA COAGULATION/BICAP);  Surgeon: Shellia Cleverly, DO;  Location: [facility] ENDOSCOPY;  Service: Gastroenterology;  Laterality: N/A;   HOT HEMOSTASIS N/A 02/10/2021   Procedure: HOT HEMOSTASIS (ARGON PLASMA COAGULATION/BICAP);  Surgeon: Jeani Hawking, MD;  Location: [facility] ENDOSCOPY;  Service: Endoscopy;  Laterality: N/A;   HOT HEMOSTASIS N/A 02/15/2022   Procedure: HOT HEMOSTASIS (ARGON PLASMA COAGULATION/BICAP);  Surgeon: Imogene Burn, MD;  Location: [facility] ENDOSCOPY;  Service: Gastroenterology;  Laterality: N/A;   HOT HEMOSTASIS N/A 11/06/2022   Procedure: HOT HEMOSTASIS (ARGON PLASMA COAGULATION/BICAP);  Surgeon: Lemar Lofty., MD;  Location: [facility] ENDOSCOPY;  Service: Gastroenterology;  Laterality: N/A;   INSERT / REPLACE / REMOVE PACEMAKER  05-01-11   02-28-05-/05-04-10-ICD-MEDTRONIC MAXIMAL DR   IR ANGIOGRAM SELECTIVE EACH ADDITIONAL VESSEL  02/18/2022   IR ANGIOGRAM SELECTIVE EACH ADDITIONAL VESSEL  02/18/2022   IR ANGIOGRAM VISCERAL SELECTIVE  02/16/2022   IR ANGIOGRAM VISCERAL SELECTIVE  02/16/2022   IR EMBO ART  VEN HEMORR LYMPH EXTRAV  INC GUIDE ROADMAPPING  02/16/2022   IR THORACENTESIS ASP PLEURAL SPACE W/IMG GUIDE   02/21/2022   IR THORACENTESIS ASP PLEURAL SPACE W/IMG GUIDE  02/22/2022   IR US GUIDE VASC ACCESS RIGHT  02/16/2022   JOINT REPLACEMENT     LAPAROSCOPIC CHOLECYSTECTOMY  11/11/2012   LAPAROSCOPIC LYSIS OF ADHESIONS N/A 11/11/2012   Procedure: LAPAROSCOPIC LYSIS OF ADHESIONS;  Surgeon: Wilmon Arms. Corliss Skains, MD;  Location: [facility] OR;  Service: General;  Laterality: N/A;   NEPHRECTOMY Right 06/2001    S/P RENAL CELL CANCER   PERIPHERAL VASCULAR INTERVENTION Bilateral 02/12/2021   Procedure: PERIPHERAL VASCULAR INTERVENTION;  Surgeon: Maeola Harman, MD;  Location: [facility] INVASIVE CV LAB;  Service: Cardiovascular;  Laterality: Bilateral;  Iliac artery stents   PRESSURE SENSOR/CARDIOMEMS N/A 02/03/2019   Procedure: PRESSURE SENSOR/CARDIOMEMS;  Surgeon: Laurey Morale, MD;  Location: [facility] INVASIVE CV LAB;  Service: Cardiovascular;  Laterality: N/A;   RIGHT HEART CATH N/A 11/09/2018   Procedure: RIGHT HEART CATH;  Surgeon: Laurey Morale, MD;  Location:  [facility] INVASIVE CV LAB;  Service: Cardiovascular;  Laterality: N/A;   RIGHT HEART CATH N/A 03/08/2019   Procedure: RIGHT HEART CATH;  Surgeon: Laurey Morale, MD;  Location: [facility] INVASIVE CV LAB;  Service: Cardiovascular;  Laterality: N/A;   SUBMUCOSAL TATTOO INJECTION  02/28/2019   Procedure: SUBMUCOSAL TATTOO INJECTION;  Surgeon: Rachael Fee, MD;  Location: [facility] ENDOSCOPY;  Service: Endoscopy;;   SUBMUCOSAL TATTOO INJECTION  11/06/2022   Procedure: SUBMUCOSAL TATTOO INJECTION;  Surgeon: Lemar Lofty., MD;  Location: [facility] ENDOSCOPY;  Service: Gastroenterology;;   TOTAL HIP ARTHROPLASTY Right 05/03/2011   Procedure: TOTAL HIP ARTHROPLASTY ANTERIOR APPROACH;  Surgeon: Kathryne Hitch;  Location: [facility] ORS;  Service: Orthopedics;  Laterality: Right;  Removal of Cannulated Screws Right Hip, Right Direct Anterior Hip Replacement   TOTAL HIP ARTHROPLASTY Left 10/09/2018   Procedure: TOTAL HIP ARTHROPLASTY ANTERIOR APPROACH;  Surgeon: Samson Frederic, MD;   Location: [facility] OR;  Service: Orthopedics;  Laterality: Left;     A IV Location/Drains/Wounds Patient Lines/Drains/Airways Status     Active Line/Drains/Airways     Name Placement date Placement time Site Days   Peripheral IV 01/30/23 20 G Anterior;Left Forearm 01/30/23  1714  Forearm  less than 1   Peripheral IV 01/30/23 20 G 1" Anterior;Proximal;Right Forearm 01/30/23  1750  Forearm  less than 1   Wound / Incision (Open or Dehisced) 02/16/22 Puncture Groin Right 02/16/22  2102  Groin  348   Wound / Incision (Open or Dehisced) 11/04/22 Non-pressure wound Foot Right;Anterior recurrent SCC neoplasm right foot 11/04/22  2200  Foot  87   Wound / Incision (Open or Dehisced) 11/04/22 Non-pressure wound Foot Left;Lower Recurrent SCC neoplasm Left Lateral foot 11/04/22  2200  Foot  87            Intake/Output Last 24 hours  Intake/Output Summary (Last 24 hours) at 01/30/2023 2017 Last data filed at 01/30/2023 2006 Gross per 24 hour  Intake 1000 ml  Output --  Net 1000 ml    Labs/Imaging Results for orders placed or performed during the hospital encounter of 01/30/23 (from the past 48 hour(s))  Comprehensive metabolic panel     Status: Abnormal   Collection Time: 01/30/23  4:18 PM  Result Value Ref Range   Sodium 136 135 - 145 mmol/L   Potassium 4.4 3.5 - 5.1 mmol/L   Chloride 98 98 - 111 mmol/L   CO2 27 22 - 32 mmol/L   Glucose, Bld 104 (H) 70 - 99 mg/dL    Comment: Glucose reference range applies only to samples taken after fasting for at least 8 hours.   BUN 53 (H) 8 - 23 mg/dL   Creatinine, Ser 2.35 (H) 0.44 - 1.00 mg/dL   Calcium 8.7 (L) 8.9 - 10.3 mg/dL   Total Protein 6.9 6.5 - 8.1 g/dL   Albumin 3.6 3.5 - 5.0 g/dL   AST 16 15 - 41 U/L   ALT 10 0 - 44 U/L   Alkaline Phosphatase 38 38 - 126 U/L   Total Bilirubin 0.9 0.3 - 1.2 mg/dL   GFR, Estimated 31 (L) >60 mL/min    Comment: (NOTE) Calculated using the CKD-EPI Creatinine Equation (2021)    Anion gap 11 5 - 15     Comment: Performed at [facility], [address] [place], [place] [address]  CBC     Status: Abnormal   Collection Time: 01/30/23  4:18 PM  Result Value Ref Range   WBC  4.6 4.0 - 10.5 K/uL   RBC 2.40 (L) 3.87 - 5.11 MIL/uL   Hemoglobin 6.6 (LL) 12.0 - 15.0 g/dL    Comment: REPEATED TO VERIFY THIS CRITICAL RESULT HAS VERIFIED AND BEEN CALLED TO K. BRANCH, RN BY SHAY EKDAHL ON 09 26 2024 AT 1639, AND HAS BEEN READ BACK.     HCT 23.1 (L) 36.0 - 46.0 %   MCV 96.3 80.0 - 100.0 fL   MCH 27.5 26.0 - 34.0 pg   MCHC 28.6 (L) 30.0 - 36.0 g/dL   RDW 78.4 (H) 69.6 - 29.5 %   Platelets 175 150 - 400 K/uL    Comment: REPEATED TO VERIFY   nRBC 0.0 0.0 - 0.2 %    Comment: Performed at [facility], [address] [place], [place] [address]  Brain natriuretic peptide     Status: Abnormal   Collection Time: 01/30/23  4:18 PM  Result Value Ref Range   B Natriuretic Peptide 322.7 (H) 0.0 - 100.0 pg/mL    Comment: Performed at [facility], [address], [place], [place] [address]  Troponin I (High Sensitivity)     Status: Abnormal   Collection Time: 01/30/23  4:18 PM  Result Value Ref Range   Troponin I (High Sensitivity) 32 (H) <18 ng/L    Comment: (NOTE) Elevated high sensitivity troponin I (hsTnI) values and significant  changes across serial measurements may suggest ACS but many other  chronic and acute conditions are known to elevate hsTnI results.  Refer to the "Links" section for chest pain algorithms and additional  guidance. Performed at [facility], [address] [place], [place] [address]   Protime-INR     Status: None   Collection Time: 01/30/23  4:21 PM  Result Value Ref Range   Prothrombin Time 14.5 11.4 - 15.2 seconds   INR 1.1 0.8 - 1.2    Comment: (NOTE) INR goal varies based on device and disease states. Performed at [facility], [address] [place], [place] [address]   Type and screen [facility]     Status:  None   Collection Time: 01/30/23  4:25 PM  Result Value Ref Range   ABO/RH(D) A NEG    Antibody Screen POS    Sample Expiration 02/02/2023,2359    Antibody Identification      ANTI FYA (Duffy a) Performed at [facility], [address], [place], [place] [address]   I-stat chem 8, ED (not at [facility], [facility] or [facility])     Status: Abnormal   Collection Time: 01/30/23  4:28 PM  Result Value Ref Range   Sodium 137 135 - 145 mmol/L   Potassium 4.4 3.5 - 5.1 mmol/L   Chloride 100 98 - 111 mmol/L   BUN 52 (H) 8 - 23 mg/dL   Creatinine, Ser 2.59 (H) 0.44 - 1.00 mg/dL   Glucose, Bld 563 (H) 70 - 99 mg/dL    Comment: Glucose reference range applies only to samples taken after fasting for at least 8 hours.   Calcium, Ion 1.09 (L) 1.15 - 1.40 mmol/L   TCO2 27 22 - 32 mmol/L   Hemoglobin 7.8 (L) 12.0 - 15.0 g/dL   HCT 87.5 (L) 64.3 - 32.9 %  Prepare RBC (crossmatch)     Status: None   Collection Time: 01/30/23  5:03 PM  Result Value Ref Range   Order Confirmation      ORDER PROCESSED  BY BLOOD BANK Performed at [facility], [address], [place], [place] [address]   POC occult blood, ED     Status: Abnormal   Collection Time: 01/30/23  5:40 PM  Result Value Ref Range   Fecal Occult Bld POSITIVE (A) NEGATIVE   *Note: Due to a large number of results and/or encounters for the requested time period, some results have not been displayed. A complete set of results can be found in Results Review.   DG Chest Portable 1 View  Result Date: 01/30/2023 CLINICAL DATA:  Shortness of breath EXAM: PORTABLE CHEST 1 VIEW COMPARISON:  01/16/2023, CT 10/07/2018 FINDINGS: Left-sided multi lead pacing device. Cardiomegaly with small bilateral effusions. Vascular congestion and probable mild interstitial edema. Numerous calcified nodules consistent with granulomas. No pneumothorax. IMPRESSION: Cardiomegaly with vascular congestion and probable mild interstitial edema. Small bilateral effusions.  Electronically Signed   By: Jasmine Pang M.D.   On: 01/30/2023 20:12    Pending Labs Unresulted Labs (From admission, onward)    None       Vitals/Pain Today's Vitals   01/30/23 1740 01/30/23 1800 01/30/23 1815 01/30/23 1830  BP:  (!) 135/40 (!) 134/40 (!) 134/36  Pulse:  82 (!) 127 60  Resp:  18 20 18   Temp: 97.6 F (36.4 C)     TempSrc: Oral     SpO2:  97%  100%  Weight:      Height:      PainSc:        Isolation Precautions No active isolations  Medications Medications  0.9 %  sodium chloride infusion (Manually program via Guardrails IV Fluids) (has no administration in time range)  diphenhydrAMINE (BENADRYL) capsule 50 mg (has no administration in time range)  famotidine (PEPCID) IVPB 20 mg premix (has no administration in time range)  methylPREDNISolone sodium succinate (SOLU-MEDROL) 125 mg/2 mL injection 125 mg (has no administration in time range)  acetaminophen (TYLENOL) tablet 975 mg (has no administration in time range)  pantoprazole (PROTONIX) 80 mg /NS 100 mL IVPB (80 mg Intravenous New Bag/Given 01/30/23 2005)  lactated ringers bolus 1,000 mL (0 mLs Intravenous Stopped 01/30/23 2006)  acetaminophen (TYLENOL) tablet 500 mg (500 mg Oral Given 01/30/23 2001)    Mobility walks with person assist     Focused Assessments Cardiac Assessment Handoff:    Lab Results  Component Value Date   CKTOTAL 114 02/10/2012   CKMB 1.1 06/08/2010   TROPONINI 0.03 (HH) 05/12/2018   Lab Results  Component Value Date   DDIMER 16.06 (H) 10/07/2018   Does the Patient currently have chest pain? No    R Recommendations: See Admitting Provider Note  Report given to:   Additional Notes: needs transfusion, blood has antibioties so blood not available at this time. Pt has hx of transfusion reaction and has medications ordered to be given prior to transfusion. Is on oxygen at 3 LPM

## 2023-01-30 NOTE — ED Notes (Signed)
Blood bank called and advised that patient tested positive for multiple antibodies, and that it would be about 4 hours before they got blood for her. They advised they would call when they received blood, to give [place] about 30 minutes to premedicate patient prior to transfusion.

## 2023-01-30 NOTE — Hospital Course (Addendum)
Mrs. Dechellis was admitted with the working diagnosis of acute on chronic anemia.   [name] w chronic diastolic CHF with RV failure (EF 65-70% on 01/16/2023) s/p PPM/AICD, pulmonary HTN, permanent atrial fibrillation not on AC due to chronic GI bleeding/AVMs, chronic anemia with antibody/history of transfusion reactions, COPD/ILD, chronic hypoxic respiratory failure on 2-3 L O2 via , Pancreatic CA, CKD stage IIIb, HLD, RCC s/p right nephrectomy, history of TIA who presented from the heart failure clinic for evaluation of symptomatic GI bleeding. -has chronic anemia related to chronic GI bleed from small bowel AVMs.  She has been admitted 3 times since this past July for issues related to this most recently 9/11-9/14. She followed by [facility] -Also reported dyspnea with minimal exertion. progressively increased swelling of both of her legs.  seen in CHF clinic and sent to the ED for further evaluation.  In the ED, hemoglobin 6.6, creatinine 1.6, BNP 322, chest x-ray with cardiomegaly pulmonary vascular congestion and interstitial edema -Admitted, transfused 2 units PRBC 9/26, started on diuretics, given IV iron -Transfused PRBC again 9/29 -9/30, repeat IV iron

## 2023-01-30 NOTE — ED Notes (Addendum)
It was noted that patient had a blood products refusal in chart, patient asked about this and confirmed she would accept transfusion. Blood product refusal FYI removed.

## 2023-01-30 NOTE — Progress Notes (Signed)
ADVANCED HF CLINIC NOTE   ID:  [name], DOB Jul 01, 1933, MRN [id number]  Type of Visit: Established patient  PCP:  [name], MD    Cardiologist:  Sherryl Manges, MD Primary HF: Dr. Shirlee Latch   History of Present Illness: 89 y.o. with history of permanent atrial fibrillation, chronic diastolic CHF, CKD stage 3, smoking/prior COPD, renal cell CA s/p right nephrectomy.  She has a prior history of HFrEF with nonischemic cardiomyopathy and had a Medtronic ICD placed.  However, subsequently her EF has recovered.  More recently, she has had symptomatic HFpEF.    6/20, she had left hip ORIF after mechanical fall.  Echo in 6/20 showed EF 60-65%, mild RV dilation with normal systolic function, PASP 93 mmHg.  After she got home from the hospital stay post-ORIF, she noted marked peripheral edema.  She was short of breath walking short distances.  +orthopnea.  She came back to the hospital on 11/04/18 and was admitted with volume overload.  She was diuresed aggressively and lost about 22 lbs.  RHC showed pulmonary venous hypertension.  PCWP tracing had prominent v-waves in absence of significant MR, suggesting significant diastolic dysfunction.   She has been noted to have significant Fe deficiency anemia, but FOBT was negative during 7/20 hospitalization.   Admitted 11/20 with symptomatic anemia, GI workup concerning for ischemic bowel.  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 Durwin Nora with VVS and conservative management recommended.  Capsule endoscopy showed no definite source of bleeding.  She was volume overloaded due to significant RV dysfunction and diuresed, she also developed AKI on CKD stage 3.      10/22, she had bilateral CIA stents placed. ABIs in 11/22 showed patent stents.  She had a skin cancer removed from her left leg. 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   Echo 2/23 EF 65-70% with mild LVH, D-shaped septum with  moderate RV enlargement, and mildly decreased RV function, PASP 92, moderate-severe TR, dilated IVC, moderate aortic stenosis.  Echo in 10/23 showed EF 60-65%, D-shaped septum, mild RV enlargement with normal RV systolic function, PASP 90, moderate-severe TR, mild AS, IVC dilated.   Patient was found to have a neuroendocrine tumor of the pancreatic head in the fall of 2023.  She has been treated with sandostatin monthly.  She also had further GI bleeding from small bowel AVMs in fall 2023.  She also had COVID-19 in 10/23 and has been on home oxygen 2L since that time.   Follow up 2/24, NYHA II-III chronic symptoms, volume overloaded. Torsemide increased to 80 mg daily, Farxiga added.   Follow up with Dr. Graciela Husbands, long discussion about EOL. Given DNR form and high-voltage therapies inactivated on ICD.  Diagnosed with shingles 09/02/22. Treated with Valtrex x1 week.   Admit 07/24 with acute on chronic blood loss anemia 2/2 GI bleed. Transfused and given IV iron. Had small bowel enteroscopy which showed 2 nonbleeding angiodysplastic lesions in duodenum and 3 nonbleeding angioplastic lesions in jejunum treated with APC.  Admitted earlier this month with acute on chronic anemia suspected 2/2 chronic GI blood loss. Hgb down to 6.8. Received 2 u RBCs. GI did not recommend any endoscopic procedures d/t advanced age and comorbidities. Course further c/b acute on chronic CHF. She was diuresed with IV lasix and metolazone. Echo during admit with LVEF 65-70%, D-shaped septum, mildly reduced RV, RVSP 103 mmHg, biatrial enlargement, severe TR, moderate mitral stenosis with mean  gradient of 6 mmHg, dilated IVC.  Here for follow-up. Arrived in a wheelchair. Feels terrible. Since discharge, she's continued to have melena regularly. Sometimes large amounts. She is short of breath and fatigued with minimal exertion. Legs are very swollen and tender up to the thighs. She is compliant with medications.    Medtronic device  interrogation: No VT/VF, no AT/AF, activity 0.1 hr/day, OptiVol > 200 since mid July, 83% V paced   Cardiomems today 19, goal 17    PMH: 1. COPD 2. Renal cell carcinoma: S/p right kidney resection.  3. CKD stage 3 4. Fe deficiency anemia 5. Atrial fibrillation: Permanent. She has failed amiodarone, Tikosyn, and flecainide as well as multiple cardioversions. 6. Chronic diastolic CHF: She has history of prior HFrEF with nonischemic cardiomyopathy.  She has a Medtronic ICD.   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  - More recently, EF has been in normal range but she has had diastolic CHF.  - Echo (6/20): EF 60-65%, mildly dilated RV with PASP 93 mmHg, mild-moderate TR.  - RHC (7/20): mean RA 11, PA 64/18 mean 39, mean PCWP 24 with v waves to 41, CI 4.21, PVR 1.9 WU.  - Cardiomems placement - RHC (11/20): mean RA 13, PA 63/23 mean 25 with v waves to 47 (MR only mild by echo), mean PCWP 25, CI 3.18, PVR 2.6 WU - Echo (11/21): EF 65-70%, moderate LVH, D-shaped septum with mildly decreased RV systolic function, RVSP 85.9 mmHg, severe RAE, severe TR, mod-sev AS. - Echo (2/23): EF 65-70% with mild LVH, D-shaped septum with moderate RV enlargement, and mildly decreased RV function, PASP 92, moderate-severe TR, dilated IVC, moderate aortic stenosis.  - Echo (10/23): EF 60-65%, D-shaped septum, mild RV enlargement with normal RV systolic function, PASP 90, moderate-severe TR, mild AS, IVC dilated.  - Echo (09/24): LVEF 65-70%, D-shaped septum, mildly reduced RV, RVSP 103 mmHg, biatrial enlargement, severe TR, moderate mitral stenosis with mean gradient of 6 mmHg, dilated IVC 7. Hyperlipidemia 8. Carotid stenosis: Carotid dopplers (3/19) with 40-59% LICA stenosis.   9. Possible ischemic bowel in 11/20 10. 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 Conservative management per Dr. Durwin Nora.  - 10/22 bilateral CIA stents.  - 11/22 ABIs: 1.07 left, 0.83 right with patent  CIA stents.  11. 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  12. Squamous cell skin cancer.  13. Aortic stenosis: Mild on 10/23 echo.  14. 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  15. COVID 19 in 10/23       Past Medical History:  Diagnosis Date   Actinic keratosis 01/17/2015    R forearm   Adjustment disorder with anxiety     Anemia     Anginal pain (HCC)     Arthritis      "some in my hands" (11/11/2012)   Atrial fibrillation (HCC)     Atypical mole 03/25/2018    L forearm - severe   Automatic implantable cardioverter-defibrillator in situ     Avascular necrosis of hip (HCC) 05/03/2011   Blood in stool     Carotid artery stenosis 09/2007    a. 09/2007: 60-79% bilateral (stable); b. 10/2008: 40-59% R 60-79%    Cellulitis of left lower extremity 07/13/2020   Chest pain, unspecified     Chronic airway obstruction, not elsewhere classified     Chronic bronchitis (HCC)      "get it some; not q year" (11/11/2012)  Chronic diastolic CHF (congestive heart failure) (HCC) 06/04/2013   Chronic kidney disease, unspecified     Coronary artery disease      non-obstructive by 2006 cath   COVID-19 virus infection 02/12/2022   Displaced fracture of left femoral neck (HCC) 10/09/2018   Frequent UTI      "get them a couple times/yr" (11/11/2012)   GI bleed 03/28/2020   Hemorrhage of rectum and anus     High cholesterol     History of blood transfusion 04/2011    "after hip OR" (11/11/2012)   Hx of renal cell cancer     Hypertension 05/20/2011   Hypertr obst cardiomyop     Hypotension, unspecified      cardiac cath 2006..nonobstructive CAD 30-40s lesions.Marland KitchenETT 1/09 nondiagnostic due to poor HR response..Right Renal Cancer 2003   Long term (current) use of anticoagulants     Malignant neoplasm of kidney, except pelvis     Osteoarthritis of right hip     Other and unspecified coagulation defects     PONV (postoperative nausea and vomiting)      Presence of permanent cardiac pacemaker     RECTAL BLEEDING 10/13/2009    Qualifier: Diagnosis of  By: Wilmon Pali NP, Paula     Renal cancer (HCC) 06/2001    Right   Secondary cardiomyopathy, unspecified     Sinus bradycardia     Squamous cell carcinoma of skin 01/17/2015    R lat wrist   Squamous cell carcinoma of skin 01/29/2018    R post upper leg - superficially invasive   Squamous cell carcinoma of skin 03/25/2018    L lat foot   Squamous cell carcinoma of skin 11/09/2020    left lat foot - EDC 01/01/21, recurrent 01/31/21 - MOHs 02/22/21   Squamous cell carcinoma of skin 12/19/2021    Right Posterior Medial Thigh, EDC   Squamous cell carcinoma of skin 12/19/2021    SCC IS, L lat heel, EDC 02/04/2022   Squamous cell carcinoma of skin 01/21/2022    L forearm, EDC 02/04/2022   Squamous cell carcinoma of skin 01/21/2022    R lower leg below knee, EDC 02/04/2022   Squamous cell carcinoma of skin 01/21/2022    SCCIS, R post heel, EDC 02/04/2022   Squamous cell carcinoma of skin 07/01/2022    left lower abdomen, in situ, EDC   Squamous cell carcinoma of skin 07/01/2022    left medial chest, EDC   Urge incontinence           Past Surgical History:  Procedure Laterality Date   ABDOMINAL AORTOGRAM W/LOWER EXTREMITY N/A 02/12/2021    Procedure: ABDOMINAL AORTOGRAM W/LOWER EXTREMITY;  Surgeon: Maeola Harman, MD;  Location: [facility] INVASIVE CV LAB;  Service: Cardiovascular;  Laterality: N/A;   ABDOMINAL HYSTERECTOMY   1975    for benign causes   APPENDECTOMY       BI-VENTRICULAR PACEMAKER UPGRADE   05/04/2010   BIOPSY   02/28/2019    Procedure: BIOPSY;  Surgeon: Rachael Fee, MD;  Location: [facility] ENDOSCOPY;  Service: Endoscopy;;   BIOPSY   03/04/2022    Procedure: BIOPSY;  Surgeon: Lemar Lofty., MD;  Location: [facility] ENDOSCOPY;  Service: Gastroenterology;;   CARDIAC CATHETERIZATION   2006   CARDIOVERSION N/A 02/20/2018    Procedure: CARDIOVERSION;  Surgeon: Antonieta Iba, MD;  Location: [facility] ORS;  Service: Cardiovascular;  Laterality: N/A;   CARDIOVERSION N/A 03/27/2018    Procedure: CARDIOVERSION;  Surgeon: Julien Nordmann  J, MD;  Location: [facility] ORS;  Service: Cardiovascular;  Laterality: N/A;   CATARACT EXTRACTION W/ INTRAOCULAR LENS  IMPLANT, BILATERAL   01/2006-02-2006   CHOLECYSTECTOMY N/A 11/11/2012    Procedure: LAPAROSCOPIC CHOLECYSTECTOMY WITH INTRAOPERATIVE CHOLANGIOGRAM;  Surgeon: Wilmon Arms. Corliss Skains, MD;  Location: [facility] OR;  Service: General;  Laterality: N/A;   COLONOSCOPY WITH PROPOFOL N/A 02/28/2019    Procedure: COLONOSCOPY WITH PROPOFOL;  Surgeon: Rachael Fee, MD;  Location: [facility] ENDOSCOPY;  Service: Endoscopy;  Laterality: N/A;   ENTEROSCOPY N/A 03/30/2020    Procedure: ENTEROSCOPY;  Surgeon: Shellia Cleverly, DO;  Location: [facility] ENDOSCOPY;  Service: Gastroenterology;  Laterality: N/A;   ENTEROSCOPY N/A 02/10/2021    Procedure: ENTEROSCOPY;  Surgeon: Jeani Hawking, MD;  Location: [facility] ENDOSCOPY;  Service: Endoscopy;  Laterality: N/A;   ENTEROSCOPY N/A 02/15/2022    Procedure: ENTEROSCOPY;  Surgeon: Imogene Burn, MD;  Location: [facility] ENDOSCOPY;  Service: Gastroenterology;  Laterality: N/A;   EP IMPLANTABLE DEVICE N/A 02/21/2016    Procedure: ICD Generator Changeout;  Surgeon: Duke Salvia, MD;  Location: [facility] INVASIVE CV LAB;  Service: Cardiovascular;  Laterality: N/A;   ESOPHAGOGASTRODUODENOSCOPY N/A 03/04/2022    Procedure: ESOPHAGOGASTRODUODENOSCOPY (EGD);  Surgeon: Lemar Lofty., MD;  Location: [facility] ENDOSCOPY;  Service: Gastroenterology;  Laterality: N/A;   ESOPHAGOGASTRODUODENOSCOPY (EGD) WITH PROPOFOL N/A 02/28/2019    Procedure: ESOPHAGOGASTRODUODENOSCOPY (EGD) WITH PROPOFOL;  Surgeon: Rachael Fee, MD;  Location: [facility] ENDOSCOPY;  Service: Endoscopy;  Laterality: N/A;   EUS N/A 03/04/2022    Procedure: UPPER ENDOSCOPIC ULTRASOUND (EUS) RADIAL;  Surgeon: Lemar Lofty., MD;  Location: [facility] ENDOSCOPY;  Service:  Gastroenterology;  Laterality: N/A;   FINE NEEDLE ASPIRATION N/A 03/04/2022    Procedure: FINE NEEDLE ASPIRATION (FNA) LINEAR;  Surgeon: Lemar Lofty., MD;  Location: [facility] ENDOSCOPY;  Service: Gastroenterology;  Laterality: N/A;   GIVENS CAPSULE STUDY N/A 03/15/2019    Procedure: GIVENS CAPSULE STUDY;  Surgeon: Tressia Danas, MD;  Location: [facility] ENDOSCOPY;  Service: Gastroenterology;  Laterality: N/A;   HOT HEMOSTASIS N/A 03/30/2020    Procedure: HOT HEMOSTASIS (ARGON PLASMA COAGULATION/BICAP);  Surgeon: Shellia Cleverly, DO;  Location: [facility] ENDOSCOPY;  Service: Gastroenterology;  Laterality: N/A;   HOT HEMOSTASIS N/A 02/10/2021    Procedure: HOT HEMOSTASIS (ARGON PLASMA COAGULATION/BICAP);  Surgeon: Jeani Hawking, MD;  Location: [facility] ENDOSCOPY;  Service: Endoscopy;  Laterality: N/A;   HOT HEMOSTASIS N/A 02/15/2022    Procedure: HOT HEMOSTASIS (ARGON PLASMA COAGULATION/BICAP);  Surgeon: Imogene Burn, MD;  Location: [facility] ENDOSCOPY;  Service: Gastroenterology;  Laterality: N/A;   INSERT / REPLACE / REMOVE PACEMAKER   05-01-11    02-28-05-/05-04-10-ICD-MEDTRONIC MAXIMAL DR   IR ANGIOGRAM SELECTIVE EACH ADDITIONAL VESSEL   02/18/2022   IR ANGIOGRAM SELECTIVE EACH ADDITIONAL VESSEL   02/18/2022   IR ANGIOGRAM VISCERAL SELECTIVE   02/16/2022   IR ANGIOGRAM VISCERAL SELECTIVE   02/16/2022   IR EMBO ART  VEN HEMORR LYMPH EXTRAV  INC GUIDE ROADMAPPING   02/16/2022   IR THORACENTESIS ASP PLEURAL SPACE W/IMG GUIDE   02/21/2022   IR THORACENTESIS ASP PLEURAL SPACE W/IMG GUIDE   02/22/2022   IR US GUIDE VASC ACCESS RIGHT   02/16/2022   JOINT REPLACEMENT       LAPAROSCOPIC CHOLECYSTECTOMY   11/11/2012   LAPAROSCOPIC LYSIS OF ADHESIONS N/A 11/11/2012    Procedure: LAPAROSCOPIC LYSIS OF ADHESIONS;  Surgeon: Wilmon Arms. Corliss Skains, MD;  Location: [facility] OR;  Service: General;  Laterality: N/A;   NEPHRECTOMY Right 06/2001  S/P RENAL CELL CANCER   PERIPHERAL VASCULAR INTERVENTION Bilateral 02/12/2021     Procedure: PERIPHERAL VASCULAR INTERVENTION;  Surgeon: Maeola Harman, MD;  Location: [facility] INVASIVE CV LAB;  Service: Cardiovascular;  Laterality: Bilateral;  Iliac artery stents   PRESSURE SENSOR/CARDIOMEMS N/A 02/03/2019    Procedure: PRESSURE SENSOR/CARDIOMEMS;  Surgeon: Laurey Morale, MD;  Location: [facility] INVASIVE CV LAB;  Service: Cardiovascular;  Laterality: N/A;   RIGHT HEART CATH N/A 11/09/2018    Procedure: RIGHT HEART CATH;  Surgeon: Laurey Morale, MD;  Location: [facility] INVASIVE CV LAB;  Service: Cardiovascular;  Laterality: N/A;   RIGHT HEART CATH N/A 03/08/2019    Procedure: RIGHT HEART CATH;  Surgeon: Laurey Morale, MD;  Location: [facility] INVASIVE CV LAB;  Service: Cardiovascular;  Laterality: N/A;   SUBMUCOSAL TATTOO INJECTION   02/28/2019    Procedure: SUBMUCOSAL TATTOO INJECTION;  Surgeon: Rachael Fee, MD;  Location: [facility] ENDOSCOPY;  Service: Endoscopy;;   TOTAL HIP ARTHROPLASTY Right 05/03/2011    Procedure: TOTAL HIP ARTHROPLASTY ANTERIOR APPROACH;  Surgeon: Kathryne Hitch;  Location: [facility] ORS;  Service: Orthopedics;  Laterality: Right;  Removal of Cannulated Screws Right Hip, Right Direct Anterior Hip Replacement   TOTAL HIP ARTHROPLASTY Left 10/09/2018    Procedure: TOTAL HIP ARTHROPLASTY ANTERIOR APPROACH;  Surgeon: Samson Frederic, MD;  Location: [facility] OR;  Service: Orthopedics;  Laterality: Left;    Current Outpatient Medications on File Prior to Encounter  Medication Sig Dispense Refill   Cyanocobalamin (B-12 PO) Take 1 tablet by mouth daily.     ferrous gluconate (FERGON) 324 MG tablet Take 1 tablet (324 mg total) by mouth daily with breakfast. 30 tablet 3   fluticasone (FLONASE) 50 MCG/ACT nasal spray Place 2 sprays into both nostrils daily as needed for allergies or rhinitis. 16 g 0   pantoprazole (PROTONIX) 40 MG tablet Take 1 tablet (40 mg total) by mouth daily. 30 tablet 3   potassium chloride SA (KLOR-CON M) 20 MEQ tablet Take 1 tablet (20 mEq total) by mouth  2 (two) times daily. (Patient taking differently: Take 20 mEq by mouth 2 (two) times daily. 60 meq in am and 40 meq pm)     rosuvastatin (CRESTOR) 40 MG tablet Take 1 tablet (40 mg total) by mouth daily. (Patient taking differently: Take 40 mg by mouth every evening.) 30 tablet 11   torsemide (DEMADEX) 20 MG tablet Take 5 tablets (100 mg total) by mouth 2 (two) times daily.     vitamin C (ASCORBIC ACID) 250 MG tablet Take 500 mg by mouth daily.     VITAMIN D PO Take 4,000 Units by mouth daily.     acetaminophen (TYLENOL) 500 MG tablet Take 1,000 mg by mouth every 8 (eight) hours as needed for mild pain or headache.     No current facility-administered medications on file prior to encounter.        Allergies:   Dilaudid [hydromorphone], Fentanyl, Codeine, Morphine and related, Amoxicillin, Hydrocodone, Oxycodone, Tramadol, Whole blood, and Sulfonamide derivatives    Social History:  The patient  reports that she quit smoking about 21 years ago. Her smoking use included cigarettes. She has a 20.00 pack-year smoking history. She has never used smokeless tobacco. She reports that she does not drink alcohol and does not use drugs.    Family History:  The patient's family history includes Breast cancer in her cousin and another family member; Breast cancer (age of onset: 22) in her maternal aunt; Early death in  her father; Heart failure in her mother.    ROS:  Please see the history of present illness.   All other systems are personally reviewed and negative.    Exam:   General:  Acute on chronically ill appearing.   HEENT: normal Neck: supple. JVP elevated.  Cor: PMI nondisplaced. Regular rate & rhythm. No rubs, gallops, + TR murmur Lungs: clear Abdomen: soft, nontender, nondistended.  Extremities: no cyanosis, clubbing, rash, 2-3+ edema to thighs, squamous cell carcinoma dorsal left foot Neuro: alert & oriented x 3. Affect pleasant.    Recent Labs: 03/01/2022: Magnesium 1.7 06/10/2022: B  Natriuretic Peptide 208.4 07/24/2022: TSH 2.93 08/08/2022: ALT 9; Hemoglobin 10.0; Platelet Count 175 09/02/2022: BUN 44; Creatinine, Ser 1.69; Potassium 3.9; Sodium 138  8/24: K 4.5, SCr 1.6   Wt Readings from Last 3 Encounters:  01/18/23 60.8 kg (134 lb 0.6 oz)  01/14/23 64.2 kg (141 lb 8 oz)  12/31/22 60.8 kg (134 lb)    There were no vitals filed for this visit.    ASSESSMENT AND PLAN: 1. Chronic diastolic CHF: With prominent RV failure.  Echo in 6/20 showed EF 60-65%, mild RV dilation with normal systolic function, PASP 93 mmHg.  She had remote nonischemic cardiomyopathy with recovery of EF, has Medtronic ICD.  I suspect that there is significant RV dysfunction. Atrial fibrillation likely contributes, but this appears to be permanent now as she has failed multiple anti-arrhythmics and is reasonably rate-controlled. RHC 11/20 showed elevated filling pressures and pulmonary venous hypertension.  Prominent v-waves on PCWP tracing likely due to stiff ventricle/diastolic dysfunction as she had only mild MR on echo.  Echo 10/23 with EF 60-65%, D-shaped septum, mild RV enlargement with normal RV systolic function, PASP 90, moderate-severe TR, mild AS, IVC dilated. Echo (09/24): LVEF 65-70%, D-shaped septum, mildly reduced RV, RVSP 103 mmHg, biatrial enlargement, severe TR, mild AS with mean gradient of 17 mmhg, moderate mitral stenosis with mean gradient of 6 mmHg, dilated IVC. She is RV pacing at a high percentage, but LV EF has remained normal.  - NYHA Class IIIb/IV. Significantly volume overloaded. OptiVol > 200. On 100 mg Torsemide BID. Recommended admission and IV diuresis see below.  - Off SGLT2i after UTIs 2. Hypertension: BP controlled.  3. Pulmonary hypertension: PA systolic pressure estimated 103 mmHg on last echo in setting of volume overload. Based on RHC 11/20, she has primarily pulmonary venous hypertension, not candidate for pulmonary vasodilators.  4. CKD: Stage 4.  Off SGLT2i - Most  recent SCr 1.87 5. Chronic anemia/GI bleeding: Due to small bowel AVMs.  - 2 recent admissions for GI bleeding - Now with ongoing melena since hospital discharge - She is being followed by Heme/Onc for her anemia.  6. Atrial fibrillation: Permanent.  Rate control is reasonable. She has failed amiodarone, Tikosyn, and flecainide as well as multiple cardioversions.  - Off eliquis with recurrent bleeding 7. Aortic stenosis: Mild on last echo.  8. Neuroendocrine tumor of pancreatic head: Treating palliative with sandostatin.  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 Volume becoming more difficult to manage. Recurrent admissions. Dr. Graciela Husbands previously discussed EOL issues with her and her ICD high velocity therapies are de-activated. She has DNR.   Sent to ED today for suspected GI bleeding and acute on chronic diastolic CHF (predominately RV failure). She is significantly volume overloaded. Will likely require admission for transfusion and IV diuresis. Would suggest ongoing goals of care discussion.   Follow up 3-4 weeks with APP.  Signed, Anna Genre, PA-C  [facility] [address] [facility] [place] [address] [phone] (office) [phone] (fax)

## 2023-01-30 NOTE — ED Provider Notes (Signed)
[place] EMERGENCY DEPARTMENT AT [facility] Provider Note   CSN: [id number] Arrival date & time: 01/30/23  1558     History  Chief Complaint  Patient presents with   GI Bleeding    [name] is a 89 y.o. female.  HPI 89 y.o. 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  Patient states that she left the hospital around 2 weeks ago and was still passing black stools at that time.  Black stools improved for several days, but none over the last week have been much more severe.  She describes several black bowel movements per day.  In addition, over this time.  She has become progressively more weak.  She feels okay when sitting, when she attempts to stand she becomes weak and lightheaded.  In addition, patient reports her lower extremities are swelling more than usual.  She takes diuretics at home, but does not think that these are helping.  Denies any recent fevers.  Does endorse mild cough and shortness of breath.  No abdominal pain, urinary frequency, pain with urination.  No bright red blood in stool.      Home Medications Prior to Admission medications   Medication Sig Start Date End Date Taking? Authorizing Provider  acetaminophen (TYLENOL) 500 MG tablet Take 1,000 mg by mouth daily.   Yes [provider]  Ascorbic Acid (VITAMIN C PO) Take 1 tablet by mouth daily.   Yes [provider]  Cholecalciferol (VITAMIN D-3 PO)  Take 2 capsules by mouth daily.   Yes [provider]  Cyanocobalamin (B-12 PO) Take 1 tablet by mouth daily.   Yes [provider]  ferrous gluconate (FERGON) 324 MG tablet Take 1 tablet (324 mg total) by mouth daily with breakfast. 11/15/22  Yes Rai, Ripudeep K, MD  fluticasone (FLONASE) 50 MCG/ACT nasal spray Place 2 sprays into both nostrils daily as needed for allergies or rhinitis. 07/04/22  Yes [name], MD  Multiple Vitamins-Minerals (HAIR SKIN NAILS PO) Take 1 capsule by mouth daily.   Yes [provider]  pantoprazole (PROTONIX) 40 MG tablet Take 1 tablet (40 mg total) by mouth daily. 11/09/22  Yes Rai, Ripudeep K, MD  potassium chloride SA (KLOR-CON M) 20 MEQ tablet Take 1 tablet (20 mEq total) by mouth 2 (two) times daily. Patient taking differently: Take 20-40 mEq by mouth See admin instructions. 40 mEq in the morning, 20 mEq in the evening. 11/09/22  Yes Rai, Ripudeep K, MD  rosuvastatin (CRESTOR) 40 MG tablet Take 1 tablet (40 mg total) by mouth daily. Patient taking differently: Take 40 mg by mouth every evening. 05/02/22  Yes [name], MD  torsemide (DEMADEX) 20 MG tablet Take 5 tablets (100 mg total) by mouth 2 (two) times daily. 01/18/23  Yes [place], Alvira Philips, DO      Allergies    Codeine, Amoxil [amoxicillin], Asa [aspirin], Ms contin [morphine], Neurontin [gabapentin], and Nsaids    Review of Systems   Review of Systems  Physical Exam Updated Vital Signs BP (!) 134/36   Pulse 60  Temp 97.6 F (36.4 C) (Oral)   Resp 18   Ht 5\' 6"  (1.676 m)   Wt 63.3 kg   SpO2 100%   BMI 22.52 kg/m  Physical Exam Constitutional:      General: She is not in acute distress.    Appearance: She is not ill-appearing.  HENT:     Head: Normocephalic.     Right Ear: External ear normal.     Left Ear: External ear normal.     Nose: Nose normal.     Mouth/Throat:     Mouth: Mucous membranes are moist.     Pharynx: Oropharynx is clear.  Eyes:      Conjunctiva/sclera: Conjunctivae normal.     Pupils: Pupils are equal, round, and reactive to light.  Cardiovascular:     Rate and Rhythm: Normal rate.     Pulses: Normal pulses.  Pulmonary:     Effort: Pulmonary effort is normal. No respiratory distress.  Abdominal:     General: Abdomen is flat.     Palpations: Abdomen is soft.     Tenderness: There is no abdominal tenderness. There is no guarding or rebound.  Genitourinary:    Rectum: Guaiac result positive.  Musculoskeletal:        General: Tenderness present.     Right lower leg: Edema present.     Left lower leg: Edema present.     Comments: 1+ pitting edema distal bilateral lower extremities  Skin:    General: Skin is warm and dry.     Findings: Erythema present.     Comments: Erythema and warmth to bilateral lower extremities across shins  Neurological:     General: No focal deficit present.     Mental Status: She is alert. Mental status is at baseline.     ED Results / Procedures / Treatments   Labs (all labs ordered are listed, but only abnormal results are displayed) Labs Reviewed  COMPREHENSIVE METABOLIC PANEL - Abnormal; Notable for the following components:      Result Value   Glucose, Bld 104 (*)    BUN 53 (*)    Creatinine, Ser 1.62 (*)    Calcium 8.7 (*)    GFR, Estimated 31 (*)    All other components within normal limits  CBC - Abnormal; Notable for the following components:   RBC 2.40 (*)    Hemoglobin 6.6 (*)    HCT 23.1 (*)    MCHC 28.6 (*)    RDW 19.8 (*)    All other components within normal limits  BRAIN NATRIURETIC PEPTIDE - Abnormal; Notable for the following components:   B Natriuretic Peptide 322.7 (*)    All other components within normal limits  POC OCCULT BLOOD, ED - Abnormal; Notable for the following components:   Fecal Occult Bld POSITIVE (*)    All other components within normal limits  I-STAT CHEM 8, ED - Abnormal; Notable for the following components:   BUN 52 (*)     Creatinine, Ser 1.70 (*)    Glucose, Bld 101 (*)    Calcium, Ion 1.09 (*)    Hemoglobin 7.8 (*)    HCT 23.0 (*)    All other components within normal limits  TROPONIN I (HIGH SENSITIVITY) - Abnormal; Notable for the following components:   Troponin I (High Sensitivity) 32 (*)    All other components within normal limits  PROTIME-INR  TYPE AND SCREEN  PREPARE RBC (CROSSMATCH)  TROPONIN I (HIGH SENSITIVITY)  EKG None  Radiology No results found.  Procedures Procedures    Medications Ordered in ED Medications  0.9 %  sodium chloride infusion (Manually program via Guardrails IV Fluids) (has no administration in time range)  diphenhydrAMINE (BENADRYL) capsule 50 mg (has no administration in time range)  famotidine (PEPCID) IVPB 20 mg premix (has no administration in time range)  methylPREDNISolone sodium succinate (SOLU-MEDROL) 125 mg/2 mL injection 125 mg (has no administration in time range)  acetaminophen (TYLENOL) tablet 975 mg (has no administration in time range)  pantoprazole (PROTONIX) 80 mg /NS 100 mL IVPB (has no administration in time range)  acetaminophen (TYLENOL) tablet 500 mg (has no administration in time range)  lactated ringers bolus 1,000 mL (1,000 mLs Intravenous New Bag/Given 01/30/23 1848)    ED Course/ Medical Decision Making/ A&P                                 Medical Decision Making Amount and/or Complexity of Data Reviewed Labs: ordered. Radiology: ordered.  Risk OTC drugs. Prescription drug management. Decision regarding hospitalization.   [name] is a 89 y.o. female who presents with black stools for several weeks in the setting of known GI bleeding and AVMs as per above.  I have reviewed the nursing documentation for past medical history, family history, and social history and agree. Recent hospital records reviewed.  Patient was admitted from 01/15/2023 through 01/18/2023 for symptomatic anemia secondary to chronic GI bleed (hx  AVMs)and iron deficiency anemia.  She received 2 units of PRBCs on 01/16/2023.  GI service was consulted, but did not recommend endoscopic procedures given her comorbidities and advanced age.  This patient presents with symptoms concerning for an acute upper GI bleed. Differential diagnoses includes peptic ulcer disease, versus gastritis/gastric ulcer, versus possible AVM. Presentation not consistent with esophageal or gastric variceal bleeding or Boerhaave's syndrome. Presentation not consistent with other etiologies upper GI bleeding at this time. No red flag features or high risk bleeding. No evidence of hemorrhagic shock.  Lower GI bleeding with differential such as angiodysplasia, cancer, IBD, mesenteric ischemia, ischemic colitis felt to be less likely given patient's history.  History, physical examination, and objective data at this time most consistent with GI bleed from known AVMs.   Plan: UGIB: Plan for CBC, CMP, coags, type and screen; +/- CXR, EKG. additional heart failure assessment completed given patient's lower extremity edema nonresponsive to home diuretics.  Testing Results: Lab results: Hemoglobin returned at 6.6.  Initial troponin 32, actually decreased from value 2 weeks ago.  BNP is 322.7, close to her baseline.  On metabolic panel, patient has a creatinine of 1.62, close with her baseline CKD.  ED Course:   Patient's medical records were reviewed.  Her transfusion threshold is less than 7.  Will prepare 1 unit of PRBCs for transfusion in the emergency department.  Of note, patient has a blood product refusal listed in her chart, but when asked about this patient states that she has this in her chart out of an abundance of caution due to allergic reactions with prior transfusions.  She has done well with premedication.  Will provide this premedication including Tylenol, Benadryl, Pepcid, and Solu-Medrol prior to today's RBC transfusion.  Patient has consented to obtaining this  transfusion.  Patient's blood transfusion likely delayed several hours due to complicated antigen and antibody matching.  In the interim, patient will receive Protonix and IVF.  ADMIT: Patient is thought to require admission for symptomatic anemia. Patient will be admitted to hospitalist service. Please see in patient provider note for additional treatment plan details.       Final Clinical Impression(s) / ED Diagnoses Final diagnoses:  Melena  Gastrointestinal hemorrhage, unspecified gastrointestinal hemorrhage type    Rx / DC Orders ED Discharge Orders     None         Lyman Speller, MD 01/30/23 1610    Alvira Monday, MD 01/31/23 1320

## 2023-01-30 NOTE — H&P (Addendum)
History and Physical    [name] ZOX:[id number] DOB: 1934/02/27 DOA: 01/30/2023  PCP: [name], MD  Patient coming from: Home  I have personally briefly reviewed patient's old medical records in [facility]  Chief Complaint: Dark black stools, generalized weakness  HPI: [name] is a 89 y.o. female with medical history significant for chronic diastolic CHF with RV failure (EF 65-70% on 01/16/2023) s/p PPM/AICD, pulmonary HTN, permanent atrial fibrillation not on AC due to chronic GI bleeding/AVMs, chronic anemia with antibody/history of transfusion reactions, COPD/ILD, chronic hypoxic respiratory failure on 2-3 L O2 via [place], pancreatic neuroendocrine tumor, CKD stage IIIb, HLD, RCC s/p right nephrectomy, history of TIA who presented from the heart failure clinic for evaluation of symptomatic GI bleeding.  Patient with persistent anemia related to chronic GI bleed from small bowel AVMs.  She has been admitted 3 times since this past July for issues related to this most recently 9/11-9/14.  At that time her hemoglobin was 6.8 on admission.  She was given 2 unit PRBC transfusion with posttransfusion hemoglobin up to 9.0 and back down to 8.0 on day of discharge.  She was seen by [facility] who recommended against repeat endoscopic evaluation.  Patient was discharged to home.  Patient states that she has been persistently fatigued since discharge.  She reports dyspnea with minimal exertion.  She has had mild cough occasionally productive of clear sputum.  She has noted progressively increased swelling of both of her legs.  She says she has been taking her torsemide 100 mg BID as prescribed with good urine output.  She was seen in our advanced heart failure clinic today and due to concerns for recurrent GI bleed and acute on chronic CHF exacerbation she was sent to the ED for further evaluation.    ED Course  Labs/Imaging on admission: I have personally reviewed following  labs and imaging studies.  Initial vitals showed BP 128/73, pulse 61, RR 17, temp 98.2 F, SpO2 100% on 3 L O2 via [place].  Labs show WBC 4.6, hemoglobin 6.6, platelets 175,000, sodium 136, potassium 4.4, bicarb 27, BUN 53, creatinine 1.62, serum glucose 104, LFTs within normal limits, BNP 322.7, troponin 32.  Portable chest x-ray showed cardiomegaly with vascular congestion and probable mild LVH distal edema.  Small bilateral effusion seen.  FOBT is positive.  Patient was ordered 2 unit PRBC transfusion (pending delivery from blood bank due to multiple antibodies).  Orders for transfusion premedication with Tylenol, Benadryl, Pepcid, and Solu-Medrol were placed.  Patient was given IV Protonix 80 mg and 1 L LR.  The hospitalist service was consulted to admit for further evaluation and management.  Review of Systems: All systems reviewed and are negative except as documented in history of present illness above.   Past Medical History:  Diagnosis Date   (HFpEF) heart failure with preserved ejection fraction (HCC)    EF=60-60%   Actinic keratosis 01/17/2015   R forearm   Adjustment disorder with anxiety    Adverse effect of other narcotics, sequela    Intolerance to all narcotics   Anti-Duffy antibodies present    Aortic valve stenosis    Arthritis    "some in my hands" (11/11/2012)   Atrial fibrillation, permanent (HCC)    Eliquis   Atypical mole 03/25/2018   L forearm - severe   Automatic implantable cardioverter-defibrillator in situ    Avascular necrosis of hip (HCC) 05/03/2011   Carotid artery stenosis 09/2007   a. 09/2007:  60-79% bilateral (stable); b. 10/2008: 40-59% R 60-79%    Cellulitis of left lower extremity 07/13/2020   CKD (chronic kidney disease), stage III (HCC)    COPD (chronic obstructive pulmonary disease) (HCC)    Coronary artery disease    non-obstructive by 2006 cath   COVID-19 virus infection 02/12/2022   Displaced fracture of left femoral neck (HCC) 10/09/2018    GI bleed 03/28/2020   AVM   High cholesterol    Hypertension 05/20/2011   Hypertr obst cardiomyop    Hypotension, unspecified    cardiac cath 2006..nonobstructive CAD 30-40s lesions.Marland KitchenETT 1/09 nondiagnostic due to poor HR response..Right Renal Cancer 2003   Iron deficiency anemia    Long term (current) use of anticoagulants    On home oxygen therapy    3L [place]   Osteoarthritis of right hip    PAD (peripheral artery disease) (HCC)    Pancreatic cancer (HCC)    sandostatin   PONV (postoperative nausea and vomiting)    Presence of permanent cardiac pacemaker    Pulmonary HTN (HCC)    RECTAL BLEEDING 10/13/2009   Qualifier: Diagnosis of  By: Wilmon Pali NP, Paula     Red blood cell antibody positive with compatible PRBC difficult to obtain    Anti FYA (Duffy a) antibody. Must be transfused with PRB which are Duffy Antigen Negative and Crossmatch Compatible   Renal cell carcinoma (HCC)    s/p nephrectomy   Squamous cell carcinoma of skin 01/17/2015   R lat wrist   Squamous cell carcinoma of skin 01/29/2018   R post upper leg - superficially invasive   Squamous cell carcinoma of skin 03/25/2018   L lat foot   Squamous cell carcinoma of skin 11/09/2020   left lat foot - EDC 01/01/21, recurrent 01/31/21 - MOHs 02/22/21   Squamous cell carcinoma of skin 12/19/2021   Right Posterior Medial Thigh, EDC   Squamous cell carcinoma of skin 12/19/2021   SCC IS, L lat heel, EDC 02/04/2022   Squamous cell carcinoma of skin 01/21/2022   L forearm, EDC 02/04/2022   Squamous cell carcinoma of skin 01/21/2022   R lower leg below knee, EDC 02/04/2022   Squamous cell carcinoma of skin 01/21/2022   SCCIS, R post heel, EDC 02/04/2022   Squamous cell carcinoma of skin 07/01/2022   left lower abdomen, in situ, EDC   Squamous cell carcinoma of skin 07/01/2022   left medial chest, EDC   Urge incontinence    Venous stasis of both lower extremities     Past Surgical History:  Procedure Laterality Date    ABDOMINAL AORTOGRAM W/LOWER EXTREMITY N/A 02/12/2021   Procedure: ABDOMINAL AORTOGRAM W/LOWER EXTREMITY;  Surgeon: Maeola Harman, MD;  Location: [facility] INVASIVE CV LAB;  Service: Cardiovascular;  Laterality: N/A;   ABDOMINAL HYSTERECTOMY  1975   for benign causes   APPENDECTOMY     BI-VENTRICULAR PACEMAKER UPGRADE  05/04/2010   BIOPSY  02/28/2019   Procedure: BIOPSY;  Surgeon: Rachael Fee, MD;  Location: [facility] ENDOSCOPY;  Service: Endoscopy;;   BIOPSY  03/04/2022   Procedure: BIOPSY;  Surgeon: Lemar Lofty., MD;  Location: [facility] ENDOSCOPY;  Service: Gastroenterology;;   CARDIAC CATHETERIZATION  2006   CARDIOVERSION N/A 02/20/2018   Procedure: CARDIOVERSION;  Surgeon: Antonieta Iba, MD;  Location: [facility] ORS;  Service: Cardiovascular;  Laterality: N/A;   CARDIOVERSION N/A 03/27/2018   Procedure: CARDIOVERSION;  Surgeon: Antonieta Iba, MD;  Location: [facility] ORS;  Service: Cardiovascular;  Laterality: N/A;  CATARACT EXTRACTION W/ INTRAOCULAR LENS  IMPLANT, BILATERAL  01/2006-02-2006   CHOLECYSTECTOMY N/A 11/11/2012   Procedure: LAPAROSCOPIC CHOLECYSTECTOMY WITH INTRAOPERATIVE CHOLANGIOGRAM;  Surgeon: Wilmon Arms. Corliss Skains, MD;  Location: [facility] OR;  Service: General;  Laterality: N/A;   COLONOSCOPY WITH PROPOFOL N/A 02/28/2019   Procedure: COLONOSCOPY WITH PROPOFOL;  Surgeon: Rachael Fee, MD;  Location: [facility] ENDOSCOPY;  Service: Endoscopy;  Laterality: N/A;   ENTEROSCOPY N/A 03/30/2020   Procedure: ENTEROSCOPY;  Surgeon: Shellia Cleverly, DO;  Location: [facility] ENDOSCOPY;  Service: Gastroenterology;  Laterality: N/A;   ENTEROSCOPY N/A 02/10/2021   Procedure: ENTEROSCOPY;  Surgeon: Jeani Hawking, MD;  Location: [facility] ENDOSCOPY;  Service: Endoscopy;  Laterality: N/A;   ENTEROSCOPY N/A 02/15/2022   Procedure: ENTEROSCOPY;  Surgeon: Imogene Burn, MD;  Location: [facility] ENDOSCOPY;  Service: Gastroenterology;  Laterality: N/A;   ENTEROSCOPY N/A 11/06/2022   Procedure: ENTEROSCOPY;  Surgeon:  Meridee Score Netty Starring., MD;  Location: [facility] ENDOSCOPY;  Service: Gastroenterology;  Laterality: N/A;   EP IMPLANTABLE DEVICE N/A 02/21/2016   Procedure: ICD Generator Changeout;  Surgeon: Duke Salvia, MD;  Location: [facility] INVASIVE CV LAB;  Service: Cardiovascular;  Laterality: N/A;   ESOPHAGOGASTRODUODENOSCOPY N/A 03/04/2022   Procedure: ESOPHAGOGASTRODUODENOSCOPY (EGD);  Surgeon: Lemar Lofty., MD;  Location: [facility] ENDOSCOPY;  Service: Gastroenterology;  Laterality: N/A;   ESOPHAGOGASTRODUODENOSCOPY (EGD) WITH PROPOFOL N/A 02/28/2019   Procedure: ESOPHAGOGASTRODUODENOSCOPY (EGD) WITH PROPOFOL;  Surgeon: Rachael Fee, MD;  Location: [facility] ENDOSCOPY;  Service: Endoscopy;  Laterality: N/A;   EUS N/A 03/04/2022   Procedure: UPPER ENDOSCOPIC ULTRASOUND (EUS) RADIAL;  Surgeon: Lemar Lofty., MD;  Location: [facility] ENDOSCOPY;  Service: Gastroenterology;  Laterality: N/A;   FINE NEEDLE ASPIRATION N/A 03/04/2022   Procedure: FINE NEEDLE ASPIRATION (FNA) LINEAR;  Surgeon: Lemar Lofty., MD;  Location: [facility] ENDOSCOPY;  Service: Gastroenterology;  Laterality: N/A;   GIVENS CAPSULE STUDY N/A 03/15/2019   Procedure: GIVENS CAPSULE STUDY;  Surgeon: Tressia Danas, MD;  Location: [facility] ENDOSCOPY;  Service: Gastroenterology;  Laterality: N/A;   HOT HEMOSTASIS N/A 03/30/2020   Procedure: HOT HEMOSTASIS (ARGON PLASMA COAGULATION/BICAP);  Surgeon: Shellia Cleverly, DO;  Location: [facility] ENDOSCOPY;  Service: Gastroenterology;  Laterality: N/A;   HOT HEMOSTASIS N/A 02/10/2021   Procedure: HOT HEMOSTASIS (ARGON PLASMA COAGULATION/BICAP);  Surgeon: Jeani Hawking, MD;  Location: [facility] ENDOSCOPY;  Service: Endoscopy;  Laterality: N/A;   HOT HEMOSTASIS N/A 02/15/2022   Procedure: HOT HEMOSTASIS (ARGON PLASMA COAGULATION/BICAP);  Surgeon: Imogene Burn, MD;  Location: [facility] ENDOSCOPY;  Service: Gastroenterology;  Laterality: N/A;   HOT HEMOSTASIS N/A 11/06/2022   Procedure: HOT HEMOSTASIS (ARGON PLASMA  COAGULATION/BICAP);  Surgeon: Lemar Lofty., MD;  Location: [facility] ENDOSCOPY;  Service: Gastroenterology;  Laterality: N/A;   INSERT / REPLACE / REMOVE PACEMAKER  05-01-11   02-28-05-/05-04-10-ICD-MEDTRONIC MAXIMAL DR   IR ANGIOGRAM SELECTIVE EACH ADDITIONAL VESSEL  02/18/2022   IR ANGIOGRAM SELECTIVE EACH ADDITIONAL VESSEL  02/18/2022   IR ANGIOGRAM VISCERAL SELECTIVE  02/16/2022   IR ANGIOGRAM VISCERAL SELECTIVE  02/16/2022   IR EMBO ART  VEN HEMORR LYMPH EXTRAV  INC GUIDE ROADMAPPING  02/16/2022   IR THORACENTESIS ASP PLEURAL SPACE W/IMG GUIDE  02/21/2022   IR THORACENTESIS ASP PLEURAL SPACE W/IMG GUIDE  02/22/2022   IR US GUIDE VASC ACCESS RIGHT  02/16/2022   JOINT REPLACEMENT     LAPAROSCOPIC CHOLECYSTECTOMY  11/11/2012   LAPAROSCOPIC LYSIS OF ADHESIONS N/A 11/11/2012   Procedure: LAPAROSCOPIC LYSIS OF ADHESIONS;  Surgeon: Wilmon Arms. Corliss Skains, MD;  Location: [facility] OR;  Service: General;  Laterality: N/A;   NEPHRECTOMY Right 06/2001    S/P RENAL CELL CANCER   PERIPHERAL VASCULAR INTERVENTION Bilateral 02/12/2021   Procedure: PERIPHERAL VASCULAR INTERVENTION;  Surgeon: Maeola Harman, MD;  Location: [facility] INVASIVE CV LAB;  Service: Cardiovascular;  Laterality: Bilateral;  Iliac artery stents   PRESSURE SENSOR/CARDIOMEMS N/A 02/03/2019   Procedure: PRESSURE SENSOR/CARDIOMEMS;  Surgeon: Laurey Morale, MD;  Location: [facility] INVASIVE CV LAB;  Service: Cardiovascular;  Laterality: N/A;   RIGHT HEART CATH N/A 11/09/2018   Procedure: RIGHT HEART CATH;  Surgeon: Laurey Morale, MD;  Location: [facility] INVASIVE CV LAB;  Service: Cardiovascular;  Laterality: N/A;   RIGHT HEART CATH N/A 03/08/2019   Procedure: RIGHT HEART CATH;  Surgeon: Laurey Morale, MD;  Location: [facility] INVASIVE CV LAB;  Service: Cardiovascular;  Laterality: N/A;   SUBMUCOSAL TATTOO INJECTION  02/28/2019   Procedure: SUBMUCOSAL TATTOO INJECTION;  Surgeon: Rachael Fee, MD;  Location: [facility] ENDOSCOPY;  Service: Endoscopy;;    SUBMUCOSAL TATTOO INJECTION  11/06/2022   Procedure: SUBMUCOSAL TATTOO INJECTION;  Surgeon: Lemar Lofty., MD;  Location: [facility] ENDOSCOPY;  Service: Gastroenterology;;   TOTAL HIP ARTHROPLASTY Right 05/03/2011   Procedure: TOTAL HIP ARTHROPLASTY ANTERIOR APPROACH;  Surgeon: Kathryne Hitch;  Location: [facility] ORS;  Service: Orthopedics;  Laterality: Right;  Removal of Cannulated Screws Right Hip, Right Direct Anterior Hip Replacement   TOTAL HIP ARTHROPLASTY Left 10/09/2018   Procedure: TOTAL HIP ARTHROPLASTY ANTERIOR APPROACH;  Surgeon: Samson Frederic, MD;  Location: [facility] OR;  Service: Orthopedics;  Laterality: Left;    Social History:  reports that she quit smoking about 21 years ago. Her smoking use included cigarettes. She started smoking about 61 years ago. She has a 20 pack-year smoking history. She has never used smokeless tobacco. She reports that she does not drink alcohol and does not use drugs.  Allergies  Allergen Reactions   Codeine Nausea And Vomiting    Hallucinations    Amoxil [Amoxicillin] Nausea Only   Asa [Aspirin] Other (See Comments)    Told to avoid due to reduced kidney function, has 1 remaining kidney.   Ms Contin [Morphine] Nausea And Vomiting and Other (See Comments)    Hallucinations    Neurontin [Gabapentin] Other (See Comments)    "Out of it"   Nsaids Other (See Comments)    Told to avoid due to reduced kidney function, has 1 remaining kidney.    Family History  Problem Relation Age of Onset   Heart failure Mother    Early death Father        car accident   Breast cancer Maternal Aunt 98   Breast cancer Cousin    Breast cancer Other    Colon cancer Neg Hx    Esophageal cancer Neg Hx    Liver disease Neg Hx    Pancreatic cancer Neg Hx    Rectal cancer Neg Hx      Prior to Admission medications   Medication Sig Start Date End Date Taking? Authorizing Provider  acetaminophen (TYLENOL) 500 MG tablet Take 1,000 mg by mouth daily.   Yes [provider]  Cyanocobalamin (B-12 PO) Take 1 tablet by mouth daily.   Yes [provider]  ferrous gluconate (FERGON) 324 MG tablet Take 1 tablet (324 mg total) by mouth daily with breakfast. 11/15/22  Yes Rai, Ripudeep K, MD  fluticasone (FLONASE) 50 MCG/ACT nasal spray Place 2 sprays into both nostrils daily as needed for allergies or rhinitis.  07/04/22  Yes [name], MD  pantoprazole (PROTONIX) 40 MG tablet Take 1 tablet (40 mg total) by mouth daily. 11/09/22  Yes Rai, Ripudeep K, MD  potassium chloride SA (KLOR-CON M) 20 MEQ tablet Take 1 tablet (20 mEq total) by mouth 2 (two) times daily. Patient taking differently: Take 20-40 mEq by mouth See admin instructions. 40 mEq in the morning, 20 mEq in the evening. 11/09/22  Yes Rai, Ripudeep K, MD  rosuvastatin (CRESTOR) 40 MG tablet Take 1 tablet (40 mg total) by mouth daily. Patient taking differently: Take 40 mg by mouth at bedtime. 05/02/22  Yes [name], MD  torsemide (DEMADEX) 20 MG tablet Take 5 tablets (100 mg total) by mouth 2 (two) times daily. 01/18/23   [place], Alvira Philips, DO  vitamin C (ASCORBIC ACID) 250 MG tablet Take 500 mg by mouth daily.    [provider]  VITAMIN D PO Take 4,000 Units by mouth daily.    [provider]    Physical Exam: Vitals:   01/30/23 1740 01/30/23 1800 01/30/23 1815 01/30/23 1830  BP:  (!) 135/40 (!) 134/40 (!) 134/36  Pulse:  82 (!) 127 60  Resp:  18 20 18   Temp: 97.6 F (36.4 C)     TempSrc: Oral     SpO2:  97%  100%  Weight:      Height:       Constitutional: Chronically ill-appearing elderly woman resting in bed with head elevated.  NAD, calm, comfortable Eyes: EOMI, lids and conjunctivae normal ENMT: Mucous membranes are moist. Posterior pharynx clear of any exudate or lesions.Normal dentition.  Neck: normal, supple, no masses. Respiratory: clear to auscultation bilaterally, no wheezing, no crackles. Normal respiratory effort. No accessory muscle use.   Cardiovascular: Irregularly irregular, systolic murmur present.  +2 bilateral lower extremity edema. 2+ pedal pulses. Abdomen: no tenderness, no masses palpated.  Musculoskeletal: no clubbing / cyanosis. No joint deformity upper and lower extremities. Good ROM, no contractures. Normal muscle tone.  Skin: Squamous cell carcinoma lesion dorsal left foot Neurologic: Sensation intact. Strength 5/5 in all 4.  Psychiatric: Normal judgment and insight. Alert and oriented x 3. Normal mood.   EKG: Personally reviewed. A-fib with V paced complexes.  Assessment/Plan Principal Problem:   Symptomatic anemia Active Problems:   Primary pancreatic neuroendocrine tumor   Anemia due to chronic blood loss   Acute on chronic diastolic CHF (congestive heart failure) (HCC)   Permanent atrial fibrillation   COPD (chronic obstructive pulmonary disease) (HCC)   CKD stage 3b, GFR 30-44 ml/min (HCC)   Pulmonary hypertension (HCC)   Chronic hypoxic respiratory failure, on home oxygen therapy (HCC)   DNR (do not resuscitate)   [name] is a 89 y.o. female with medical history significant for chronic diastolic CHF with RV failure (EF 65-70% on 01/16/2023) s/p PPM/AICD, pulmonary HTN, permanent atrial fibrillation not on AC due to chronic GI bleeding/AVMs, chronic anemia with antibody/history of transfusion reactions, COPD/ILD, chronic hypoxic respiratory failure on 2-3 L O2 via [place], pancreatic neuroendocrine tumor, CKD stage IIIb, HLD, RCC s/p right nephrectomy, history of TIA who is admitted with acute on chronic symptomatic anemia due to chronic GI bleed.  Assessment and Plan: Acute on chronic symptomatic anemia Chronic GI bleeding due to small bowel AVMs: Recurrent symptomatic anemia likely due to persistent GI bleed from small bowel AVMs.  Admitted for same 2 weeks ago and at that time GI recommended against further endoscopic evaluation.  She has had discussions with  her primary gastroenterologist Dr.  Barron Alvine who also recommended to avoid any endoscopic procedures given her communities, advanced age.  Hemoglobin 6.6 on admission. -Transfuse 2 unit PRBC -Give IV Lasix 80 mg with blood transfusion -Check ferritin/iron/TIBC; if ferritin <100 consider IV iron infusion -Repeat CBC in a.m. -Follow-up with heme-onc Dr. Mosetta Putt scheduled for 9/30  Acute on chronic diastolic CHF with RV failure: Presenting with increased lower extremity edema, progressive dyspnea, BNP 322.7.  CXR with interstitial edema and small bilateral pleural effusions. TTE 01/16/2023 showed EF 65-70%, D-shaped IV septum suggesting RV pressure/volume overload, severely elevated pulmonary artery systolic pressure. -IV Lasix 80 mg tonight with blood transfusion -Continue torsemide 100 mg twice daily -Strict I/O's/daily weights  Permanent atrial fibrillation S/p PPM/AICD: Not on anticoagulation due to chronic GI bleeding and anemia.  PPM in place, ICD has been deactivated per cardiology documentation.  Rate is currently controlled off of rate/rhythm controlling agents.  CKD stage IIIb Hx renal cell carcinoma s/p right nephrectomy: Renal function is stable with creatinine 1.62.  COPD/ILD Pulmonary hypertension Chronic hypoxic respiratory failure on 2-3 with O2 via [place] at baseline: Currently stable.  Not on maintenance inhalers as an outpatient.  Pancreatic neuroendocrine tumor: Follows with oncology Dr. Mosetta Putt as an outpatient.  Currently on palliative Sandostatin injections.  CAD History of TIA Carotid artery stenosis: Continue rosuvastatin 40 mg daily.  Not on aspirin.  Generalized weakness/deconditioning/debility/goals of care: Secondary to CHF with presumed end-stage RV failure and chronic GI bleeding with recurrent symptomatic anemia. -PT/OT eval -CODE STATUS is DNR, confirmed with patient on admission -Patient may benefit from further palliative care discussions   DVT prophylaxis: SCDs Start: 01/30/23 2022 Code  Status:   Code Status: Limited: Do not attempt resuscitation (DNR) -DNR-LIMITED -Do Not Intubate/DNI   Family Communication: Niece at bedside Disposition Plan: From home and likely discharge to home pending clinical progress Consults called: None Severity of Illness: The appropriate patient status for this patient is INPATIENT. Inpatient status is judged to be reasonable and necessary in order to provide the required intensity of service to ensure the patient's safety. The patient's presenting symptoms, physical exam findings, and initial radiographic and laboratory data in the context of their chronic comorbidities is felt to place them at high risk for further clinical deterioration. Furthermore, it is not anticipated that the patient will be medically stable for discharge from the hospital within 2 midnights of admission.   * I certify that at the point of admission it is my clinical judgment that the patient will require inpatient hospital care spanning beyond 2 midnights from the point of admission due to high intensity of service, high risk for further deterioration and high frequency of surveillance required.Darreld Mclean MD [facility]  If 7PM-7AM, please contact night-coverage [email or web address]  01/30/2023, 8:46 PM

## 2023-01-30 NOTE — ED Triage Notes (Addendum)
Pt c/o int black stoolx2wks. Pt states the black stool is worse this week. Pt c/o dizziness, SOB and weakness. Pt wears 2-3L 02 per [place] at baseline. Pt denies blood thinner.

## 2023-01-31 DIAGNOSIS — D649 Anemia, unspecified: Secondary | ICD-10-CM | POA: Diagnosis not present

## 2023-01-31 LAB — BASIC METABOLIC PANEL
Anion gap: 14 (ref 5–15)
BUN: 53 mg/dL — ABNORMAL HIGH (ref 8–23)
CO2: 23 mmol/L (ref 22–32)
Calcium: 9.1 mg/dL (ref 8.9–10.3)
Chloride: 100 mmol/L (ref 98–111)
Creatinine, Ser: 1.75 mg/dL — ABNORMAL HIGH (ref 0.44–1.00)
GFR, Estimated: 28 mL/min — ABNORMAL LOW (ref >60)
Glucose, Bld: 227 mg/dL — ABNORMAL HIGH (ref 70–99)
Potassium: 3.9 mmol/L (ref 3.5–5.1)
Sodium: 137 mmol/L (ref 135–145)

## 2023-01-31 LAB — CBC
HCT: 30.2 % — ABNORMAL LOW (ref 36.0–46.0)
Hemoglobin: 8.9 g/dL — ABNORMAL LOW (ref 12.0–15.0)
MCH: 27.1 pg (ref 26.0–34.0)
MCHC: 29.5 g/dL — ABNORMAL LOW (ref 30.0–36.0)
MCV: 91.8 fL (ref 80.0–100.0)
Platelets: 168 10*3/uL (ref 150–400)
RBC: 3.29 MIL/uL — ABNORMAL LOW (ref 3.87–5.11)
RDW: 20.5 % — ABNORMAL HIGH (ref 11.5–15.5)
WBC: 9 10*3/uL (ref 4.0–10.5)
nRBC: 0 % (ref 0.0–0.2)

## 2023-01-31 LAB — IRON AND TIBC
Iron: 322 ug/dL — ABNORMAL HIGH (ref 28–170)
Saturation Ratios: 86 % — ABNORMAL HIGH (ref 10.4–31.8)
TIBC: 375 ug/dL (ref 250–450)
UIBC: 53 ug/dL

## 2023-01-31 LAB — FERRITIN: Ferritin: 31 ng/mL (ref 11–307)

## 2023-01-31 LAB — MAGNESIUM: Magnesium: 2.3 mg/dL (ref 1.7–2.4)

## 2023-01-31 MED ORDER — SODIUM CHLORIDE 0.9 % IV SOLN
100.0000 mg | Freq: Once | INTRAVENOUS | Status: AC
  Administered 2023-01-31: 100 mg via INTRAVENOUS
  Filled 2023-01-31: qty 5

## 2023-01-31 MED ORDER — FUROSEMIDE 10 MG/ML IJ SOLN
80.0000 mg | Freq: Two times a day (BID) | INTRAMUSCULAR | Status: DC
  Administered 2023-01-31 – 2023-02-04 (×8): 80 mg via INTRAVENOUS
  Filled 2023-01-31 (×8): qty 8

## 2023-01-31 NOTE — Progress Notes (Signed)
Heart Failure Navigator Progress Note  Assessed for Heart & Vascular TOC clinic readiness.  Patient does not meet criteria due to Advanced Heart Failure Team patient of Dr. McLean.   Navigator will sign off at this time.   Mahreen Schewe, BSN, RN Heart Failure Nurse Navigator Secure Chat Only   

## 2023-01-31 NOTE — Consult Note (Signed)
Value-Based Care [facility] Inpatient Consult   01/31/2023  DAIL MEECE May 28, 1933 [id number]  [facility] [[facility]]  Accountable Care Organization [ACO] Patient: [employer]  Primary Care Provider:  [name], MD with [place] [facility] is listed to provide the Transition of Care follow up and calls.  Patient is currently active with [facility] [[facility]] Care Management for care coordination services.  Patient has been engaged by a [employer]. The community based plan of care has focused on disease management and community resource support.   Patient's electronic medical record reviewed PT/OT and inpatient [facility] team for potential post hospital needs.  Plan: Following patient for progress and post hospital needs. Patient currently has a follow up appointment with [facility] Coordinator on 02/06/23.  Will follow up   Of note, [facility] Care Management services does not replace or interfere with any services that are needed or arranged by inpatient [facility] care management team.   Charlesetta Shanks, RN, BSN, CCM [place]  [facility], [facility] Liaison Direct Dial: [phone] or secure chat Website: [email or web address]

## 2023-01-31 NOTE — Progress Notes (Addendum)
PROGRESS NOTE    [name]  ZOX:[id number] DOB: Dec 10, 1933 DOA: 01/30/2023 PCP: [name], MD  [name] w chronic diastolic CHF with RV failure (EF 65-70% on 01/16/2023) s/p PPM/AICD, pulmonary HTN, permanent atrial fibrillation not on AC due to chronic GI bleeding/AVMs, chronic anemia with antibody/history of transfusion reactions, COPD/ILD, chronic hypoxic respiratory failure on 2-3 L O2 via [place], Pancreatic neuroendocrine tumor, CKD stage IIIb, HLD, RCC s/p right nephrectomy, history of TIA who presented from the heart failure clinic for evaluation of symptomatic GI bleeding. -has chronic anemia related to chronic GI bleed from small bowel AVMs.  She has been admitted 3 times since this past July for issues related to this most recently 9/11-9/14. She was seen by [facility] who recommended against repeat endoscopic evaluation.  -Also reports dyspnea with minimal exertion. progressively increased swelling of both of her legs.  She says she has been taking her torsemide 100 mg BID, seen in CHF clinic and sent to the ED for further evaluation.  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 Barron Alvine who also recommended to avoid any endoscopic procedures given her advanced age, significant comorbidities -Hemoglobin 6.6 on admission, transfused 2 units PRBC -Anemia panel last few months have noted iron deficiency, repeat anemia panel this morning likely erroneous after transfusion -Will administer IV iron today -Follow-up with heme-onc Dr. Mosetta Putt scheduled  for 9/30, she also gets Procrit shots   Acute on chronic diastolic CHF with RV failure: Severe pulmonary hypertension -With volume overload at this time, predominantly RV  -TTE 01/16/2023 showed EF 65-70%, severely elevated pulmonary artery systolic pressure.  Mildly reduced RV, moderate mitral stenosis -Continue IV Lasix 80 Mg twice daily today, cannot place Unna boots on account of leg wounds -Off SGLT2i with UTIs -Recommended palliative consult for goals of care given chronic disease burden   Permanent atrial fibrillation S/p PPM/AICD: Not on anticoagulation due to chronic GI bleeding and anemia.  PPM in place, ICD has been deactivated per cardiology documentation.  Rate is currently controlled off of rate/rhythm controlling agents.   CKD stage IIIb Hx renal cell carcinoma s/p right nephrectomy: Stable, monitor   COPD/ILD Pulmonary hypertension Chronic hypoxic respiratory failure on 2-3 with O2 via [place] at baseline: Currently stable.  Not on maintenance inhalers as an outpatient.   Pancreatic neuroendocrine tumor: Follows with oncology Dr. Mosetta Putt as an outpatient.  Currently on palliative Sandostatin injections. -Last CT 7/21 noted enlarging pancreatic mass with indeterminate liver masses raises concern for mets -I think her overall prognosis is very poor with significant chronic disease burden   CAD History of TIA Carotid artery stenosis: Continue rosuvastatin 40 mg daily.  Not on aspirin.   Generalized weakness/deconditioning/debility/goals of care: Secondary to CHF with presumed end-stage RV failure and chronic GI bleeding with recurrent symptomatic anemia. -She is DNR, recommended palliative consult for goals of care, she is not open to this today, will rediscuss again tomorrow  DVT prophylaxis: SCDs Code Status: DNR Family Communication: None present Disposition Plan: Home likely 48 hours  Consultants:    Procedures:   Antimicrobials:    Objective: Vitals:  01/31/23 0500 01/31/23 0515 01/31/23 0520 01/31/23 0740  BP: (!) 114/51 (!) 118/48 (!) 118/48 131/77  Pulse: 60 60  60  Resp: (!) 21 (!) 21    Temp: 98.6 F (37 C) 98.3 F (36.8 C) 98.3 F (36.8 C) 98.6 F (37 C)  TempSrc: Oral  Oral Oral  SpO2:    100%  Weight:   63.6 kg   Height:        Intake/Output Summary (Last 24 hours) at 01/31/2023 1024 Last data filed at 01/31/2023 0745 Gross per 24 hour  Intake 1738 ml  Output --  Net 1738 ml   Filed Weights   01/30/23 1614 01/31/23 0520  Weight: 63.3 kg 63.6 kg    Examination:  General exam: Chronically ill elderly female appears debilitated, HEENT: Positive JVD CVS: S1-S2, irregular rhythm Lungs: Few basilar Rales Abdomen: Soft, nontender, bowel sounds present Extremities: 2+ edema, small superficial wounds on lower legs Psychiatry:  Mood & affect appropriate.     Data Reviewed:   CBC: Recent Labs  Lab 01/30/23 1618 01/30/23 1628 01/31/23 0843  WBC 4.6  --  9.0  HGB 6.6* 7.8* 8.9*  HCT 23.1* 23.0* 30.2*  MCV 96.3  --  91.8  PLT 175  --  168   Basic Metabolic Panel: Recent Labs  Lab 01/30/23 1618 01/30/23 1628 01/31/23 0843  NA 136 137 137  K 4.4 4.4 3.9  CL 98 100 100  CO2 27  --  23  GLUCOSE 104* 101* 227*  BUN 53* 52* 53*  CREATININE 1.62* 1.70* 1.75*  CALCIUM 8.7*  --  9.1  MG  --   --  2.3   GFR: Estimated Creatinine Clearance: 21.6 mL/min (A) (by C-G formula based on SCr of 1.75 mg/dL (H)). Liver Function Tests: Recent Labs  Lab 01/30/23 1618  AST 16  ALT 10  ALKPHOS 38  BILITOT 0.9  PROT 6.9  ALBUMIN 3.6   No results for input(s): "LIPASE", "AMYLASE" in the last 168 hours. No results for input(s): "AMMONIA" in the last 168 hours. Coagulation Profile: Recent Labs  Lab 01/30/23 1621  INR 1.1   Cardiac Enzymes: No results for input(s): "CKTOTAL", "CKMB", "CKMBINDEX", "TROPONINI" in the last 168 hours. BNP (last 3 results) No results for input(s): "PROBNP" in the last 8760  hours. HbA1C: No results for input(s): "HGBA1C" in the last 72 hours. CBG: No results for input(s): "GLUCAP" in the last 168 hours. Lipid Profile: No results for input(s): "CHOL", "HDL", "LDLCALC", "TRIG", "CHOLHDL", "LDLDIRECT" in the last 72 hours. Thyroid Function Tests: No results for input(s): "TSH", "T4TOTAL", "FREET4", "T3FREE", "THYROIDAB" in the last 72 hours. Anemia Panel: Recent Labs    01/31/23 0843  FERRITIN 31  TIBC 375  IRON 322*   Urine analysis:    Component Value Date/Time   COLORURINE YELLOW 08/21/2022 1300   APPEARANCEUR CLEAR 08/21/2022 1300   APPEARANCEUR Cloudy (A) 01/27/2018 1330   LABSPEC 1.016 08/21/2022 1300   PHURINE 5.0 08/21/2022 1300   GLUCOSEU NEGATIVE 08/21/2022 1300   HGBUR NEGATIVE 08/21/2022 1300   HGBUR large 11/16/2009 1202   BILIRUBINUR NEGATIVE 08/21/2022 1300   BILIRUBINUR Negative 11/08/2021 1237   BILIRUBINUR Negative 01/27/2018 1330   KETONESUR NEGATIVE 08/21/2022 1300   PROTEINUR NEGATIVE 08/21/2022 1300   UROBILINOGEN 0.2 11/08/2021 1237   UROBILINOGEN 1.0 08/16/2014 1754   NITRITE NEGATIVE 08/21/2022 1300   LEUKOCYTESUR MODERATE (A) 08/21/2022 1300   Sepsis Labs: @LABRCNTIP (procalcitonin:4,lacticidven:4)  )No results found for this or any previous visit (  from the past 240 hour(s)).   Radiology Studies: DG Chest Portable 1 View  Result Date: 01/30/2023 CLINICAL DATA:  Shortness of breath EXAM: PORTABLE CHEST 1 VIEW COMPARISON:  01/16/2023, CT 10/07/2018 FINDINGS: Left-sided multi lead pacing device. Cardiomegaly with small bilateral effusions. Vascular congestion and probable mild interstitial edema. Numerous calcified nodules consistent with granulomas. No pneumothorax. IMPRESSION: Cardiomegaly with vascular congestion and probable mild interstitial edema. Small bilateral effusions. Electronically Signed   By: Jasmine Pang M.D.   On: 01/30/2023 20:12     Scheduled Meds:  pantoprazole  40 mg Oral Daily   potassium  chloride  40 mEq Oral q morning   And   potassium chloride  20 mEq Oral QPM   rosuvastatin  40 mg Oral QPM   sodium chloride flush  3 mL Intravenous Q12H   torsemide  100 mg Oral BID   Continuous Infusions:   LOS: 1 day    Time spent:    Zannie Cove, MD [facility]   01/31/2023, 10:24 AM

## 2023-01-31 NOTE — Evaluation (Signed)
Physical Therapy Evaluation Patient Details Name: [name] MRN: [id number] DOB: 1934-05-02 Today's Date: 01/31/2023  History of Present Illness  89 yo female presents to [facility] on 9/26 with tarry stools x2 weeks, dizziness, SOB, weakness. Recent d/c 9/14 for symptomatic anemia. PMHx: upper GIB and transfusion reactions, afib, dCHF, severe tricuspid regurgitation, NICM s/p ICD, COPD on 2-3LO2 at baseline, PAD, CKD3, RCC s/p nephrectomy, ischemic colitis, bilat THA.  Clinical Impression   Pt presents with generalized weakness, BLE pain secondary to swelling and sores, impaired balance, impaired activity tolerance. Pt to benefit from acute PT to address deficits. Pt ambulated short hallway distance with supervision for safety and use of RW, PT encouraging RW use once d/c home for balance and energy conservation. VSS throughout. Pt declines need for follow up PT services post-acutely. PT to progress mobility as tolerated, and will continue to follow acutely.          If plan is discharge home, recommend the following: Assist for transportation;Help with stairs or ramp for entrance;A little help with walking and/or transfers   Can travel by private vehicle        Equipment Recommendations None recommended by PT  Recommendations for Other Services       Functional Status Assessment Patient has had a recent decline in their functional status and demonstrates the ability to make significant improvements in function in a reasonable and predictable amount of time.     Precautions / Restrictions Precautions Precautions: Fall Restrictions Weight Bearing Restrictions: No      Mobility  Bed Mobility               General bed mobility comments: up in chair    Transfers Overall transfer level: Needs assistance Equipment used: Rolling walker (2 wheels) Transfers: Sit to/from Stand Sit to Stand: Supervision           General transfer comment: for safety and lines/leads  only, increased time    Ambulation/Gait Ambulation/Gait assistance: Supervision Gait Distance (Feet): 50 Feet Assistive device: Rolling walker (2 wheels) Gait Pattern/deviations: Step-through pattern, Decreased stride length, Trunk flexed Gait velocity: decr     General Gait Details: cues for room navigation, RW proximity. RW utilized given pt weakness and poor activity tolerance. DOE 1/4, SpO2 95% post-gait on baseline 2LO2  Stairs            Wheelchair Mobility     Tilt Bed    Modified Rankin (Stroke Patients Only)       Balance Overall balance assessment: Needs assistance Sitting-balance support: Feet supported Sitting balance-Leahy Scale: Good     Standing balance support: No upper extremity supported Standing balance-Leahy Scale: Fair                               Pertinent Vitals/Pain Pain Assessment Pain Assessment: Faces Faces Pain Scale: Hurts even more Pain Location: LEs Pain Descriptors / Indicators: Guarding, Grimacing, Discomfort Pain Intervention(s): Limited activity within patient's tolerance, Monitored during session, Repositioned    Home Living Family/patient expects to be discharged to:: Private residence Living Arrangements: Other relatives Available Help at Discharge: Family;Available PRN/intermittently Type of Home: House Home Access: Stairs to enter Entrance Stairs-Rails: Right;Left;Can reach both Entrance Stairs-Number of Steps: 2   Home Layout: One level Home Equipment: Pharmacist, hospital (2 wheels);Cane - single point;Grab bars - tub/shower;BSC/3in1 Additional Comments: great grandson works 24 hours shifts but is off several days in a row. she  has other family that checks in also    Prior Function Prior Level of Function : Independent/Modified Independent             Mobility Comments: using a cane over the past 2 weeks, otherwise is independent and on 2 L02 ADLs Comments: independent      Extremity/Trunk Assessment   Upper Extremity Assessment Upper Extremity Assessment: Defer to OT evaluation    Lower Extremity Assessment Lower Extremity Assessment: Generalized weakness;RLE deficits/detail;LLE deficits/detail RLE Deficits / Details: distal>proximal edema and rubor, very TTP LLE Deficits / Details: distal>proximal edema and rubor, very TTP    Cervical / Trunk Assessment Cervical / Trunk Assessment: Kyphotic  Communication   Communication Communication: No apparent difficulties  Cognition Arousal: Alert Behavior During Therapy: WFL for tasks assessed/performed Overall Cognitive Status: Within Functional Limits for tasks assessed                                          General Comments      Exercises     Assessment/Plan    PT Assessment Patient needs continued PT services  PT Problem List Decreased strength;Decreased activity tolerance;Decreased balance;Decreased mobility;Cardiopulmonary status limiting activity;Decreased skin integrity;Pain       PT Treatment Interventions DME instruction;Gait training;Stair training;Functional mobility training;Therapeutic activities;Balance training;Therapeutic exercise;Neuromuscular re-education;Patient/family education    PT Goals (Current goals can be found in the Care Plan section)  Acute Rehab PT Goals PT Goal Formulation: With patient Time For Goal Achievement: 02/14/23 Potential to Achieve Goals: Good    Frequency Min 1X/week     Co-evaluation               AM-PAC PT "6 Clicks" Mobility  Outcome Measure Help needed turning from your back to your side while in a flat bed without using bedrails?: A Little Help needed moving from lying on your back to sitting on the side of a flat bed without using bedrails?: A Little Help needed moving to and from a bed to a chair (including a wheelchair)?: A Little Help needed standing up from a chair using your arms (e.g., wheelchair or  bedside chair)?: A Little Help needed to walk in hospital room?: A Little Help needed climbing 3-5 steps with a railing? : A Little 6 Click Score: 18    End of Session Equipment Utilized During Treatment: Oxygen Activity Tolerance: Patient tolerated treatment well;Patient limited by fatigue Patient left: in chair;with call bell/phone within reach;Other (comment) (pt states she will press call button and wait for assist prior to mobilizing out of chair) Nurse Communication: Mobility status PT Visit Diagnosis: Unsteadiness on feet (R26.81);Muscle weakness (generalized) (M62.81)    Time: 1011-1040 PT Time Calculation (min) (ACUTE ONLY): 29 min   Charges:   PT Evaluation $PT Eval Low Complexity: 1 Low PT Treatments $Therapeutic Activity: 8-22 mins PT General Charges $$ ACUTE PT VISIT: 1 Visit         Marye Round, PT DPT Acute Rehabilitation Services Secure Chat Preferred  Office [phone]   [name] 01/31/2023, 11:03 AM

## 2023-01-31 NOTE — Evaluation (Signed)
Occupational Therapy Evaluation Patient Details Name: [name] MRN: [id number] DOB: 05/23/1933 Today's Date: 01/31/2023   History of Present Illness 89 yo female presents to [facility] on 9/26 with tarry stools x2 weeks, dizziness, SOB, weakness. Recent d/c 9/14 for symptomatic anemia. PMHx: upper GIB and transfusion reactions, afib, dCHF, severe tricuspid regurgitation, NICM s/p ICD, COPD on 2-3LO2 at baseline, PAD, CKD3, RCC s/p nephrectomy, ischemic colitis, bilat THA.   Clinical Impression   Patient admitted for the diagnosis above.  PTA she lives at home with her great grandson, who is available PRN, but other family members check on her as needed.  Patient remains fairly independent using a SPC for mobility, and completes her own ADL.  Currently she is dizzy at times, but demonstrates good safety.  OT will follow in the acute setting, but no post acute OT is anticipated.         If plan is discharge home, recommend the following: Assist for transportation;Assistance with cooking/housework    Functional Status Assessment  Patient has had a recent decline in their functional status and demonstrates the ability to make significant improvements in function in a reasonable and predictable amount of time.  Equipment Recommendations  None recommended by OT    Recommendations for Other Services       Precautions / Restrictions Precautions Precautions: Fall Restrictions Weight Bearing Restrictions: No      Mobility Bed Mobility Overal bed mobility: Modified Independent               Patient Response: Cooperative  Transfers Overall transfer level: Needs assistance Equipment used: None Transfers: Sit to/from Stand, Bed to chair/wheelchair/BSC Sit to Stand: Supervision     Step pivot transfers: Contact guard assist            Balance Overall balance assessment: Needs assistance Sitting-balance support: Feet supported Sitting balance-Leahy Scale: Good      Standing balance support: No upper extremity supported Standing balance-Leahy Scale: Fair                             ADL either performed or assessed with clinical judgement   ADL       Grooming: Wash/dry hands;Wash/dry face;Contact guard assist;Standing               Lower Body Dressing: Contact guard assist;Sit to/from stand   Toilet Transfer: Contact guard assist;Minimal Holiday representative;Ambulation                   Vision Baseline Vision/History: 1 Wears glasses Patient Visual Report: No change from baseline       Perception Perception: Within Functional Limits       Praxis Praxis: WFL       Pertinent Vitals/Pain Pain Assessment Pain Assessment: No/denies pain     Extremity/Trunk Assessment Upper Extremity Assessment Upper Extremity Assessment: Overall WFL for tasks assessed   Lower Extremity Assessment Lower Extremity Assessment: Defer to PT evaluation   Cervical / Trunk Assessment Cervical / Trunk Assessment: Kyphotic   Communication Communication Communication: No apparent difficulties   Cognition Arousal: Alert Behavior During Therapy: WFL for tasks assessed/performed Overall Cognitive Status: Within Functional Limits for tasks assessed                                       General Comments   VSS on O2  Exercises     Shoulder Instructions      Home Living Family/patient expects to be discharged to:: Private residence Living Arrangements: Other relatives Available Help at Discharge: Family;Available PRN/intermittently Type of Home: House Home Access: Stairs to enter [employer] of Steps: 2 Entrance Stairs-Rails: Right;Left;Can reach both Home Layout: One level     Bathroom Shower/Tub: Chief Strategy Officer: Handicapped height Bathroom Accessibility: Yes How Accessible: Accessible via walker Home Equipment: Pharmacist, hospital (2 wheels);Cane - single  point;Grab bars - tub/shower;BSC/3in1   Additional Comments: great grandson works 24 hours shifts but is off several days in a row. she has other family that checks in also      Prior Functioning/Environment Prior Level of Function : Independent/Modified Independent             Mobility Comments: using a cane over the past 2 weeks, otherwise is independent and on 2 L02 ADLs Comments: independent        OT Problem List: Impaired balance (sitting and/or standing);Decreased activity tolerance      OT Treatment/Interventions: Self-care/ADL training;Therapeutic activities;Patient/family education;Balance training;DME and/or AE instruction    OT Goals(Current goals can be found in the care plan section) Acute Rehab OT Goals Patient Stated Goal: Return home OT Goal Formulation: With patient Time For Goal Achievement: 02/14/23 Potential to Achieve Goals: Good ADL Goals Pt Will Perform Grooming: with modified independence;standing Pt Will Perform Lower Body Dressing: with modified independence;sit to/from stand Pt Will Transfer to Toilet: with modified independence;ambulating;regular height toilet  OT Frequency: Min 1X/week    Co-evaluation              AM-PAC OT "6 Clicks" Daily Activity     Outcome Measure Help from another person eating meals?: None Help from another person taking care of personal grooming?: None Help from another person toileting, which includes using toliet, bedpan, or urinal?: A Little Help from another person bathing (including washing, rinsing, drying)?: A Little Help from another person to put on and taking off regular upper body clothing?: None Help from another person to put on and taking off regular lower body clothing?: A Little 6 Click Score: 21   End of Session Equipment Utilized During Treatment: Oxygen Nurse Communication: Mobility status  Activity Tolerance: Patient tolerated treatment well Patient left: in chair;with call bell/phone  within reach  OT Visit Diagnosis: Unsteadiness on feet (R26.81)                Time: 0102-7253 OT Time Calculation (min): 22 min Charges:  OT General Charges $OT Visit: 1 Visit OT Evaluation $OT Eval Moderate Complexity: 1 Mod  01/31/2023  RP, OTR/L  Acute Rehabilitation Services  Office:  [phone]   Suzanna Obey 01/31/2023, 9:26 AM

## 2023-01-31 NOTE — TOC Initial Note (Signed)
Transition of Care [facility]) - Initial/Assessment Note    Patient Details  Name: [name] MRN: [id number] Date of Birth: 12-Aug-1933  Transition of Care [facility]) CM/SW Contact:    Leone Haven, RN Phone Number: 01/31/2023, 3:41 PM  Clinical Narrative:                 From home with grandson, has PCP and insurance on file, states has no HH services in place at this time , she has a walker at home and home oxygen 2 liters with adapt.  States daughter  will transport her home at [employer] and family is support system, states gets medications from [facility].Sandria Bales self ambulatory. Patient states she did not want to talk about [facility] services right now, and she does not think she needs it at this time.  NCM saw where she was previously set up by MD office with [facility] but they say she refused HH at that time as well.    Expected Discharge Plan: Home w Home Health Services Barriers to Discharge: Continued Medical Work up   Patient Goals and CMS Choice Patient states their goals for this hospitalization and ongoing recovery are:: return home   Choice offered to / list presented to : NA      Expected Discharge Plan and Services In-house Referral: NA Discharge Planning Services: CM Consult Post Acute Care Choice: NA Living arrangements for the past 2 months: Single Family Home                 DME Arranged: N/A         HH Arranged: Patient Refused HH          Prior Living Arrangements/Services Living arrangements for the past 2 months: Single Family Home Lives with:: Adult Children (grand son) Patient language and need for interpreter reviewed:: Yes Do you feel safe going back to the place where you live?: Yes      Need for Family Participation in Patient Care: Yes (Comment) Care giver support system in place?: Yes (comment) Current home services: DME (walker, home oxygen 2 liters with adapt) Criminal Activity/Legal Involvement Pertinent to Current  Situation/Hospitalization: No - Comment as needed  Activities of Daily Living   ADL Screening (condition at time of admission) Does the patient have a NEW difficulty with bathing/dressing/toileting/self-feeding that is expected to last >3 days?: Yes (Initiates electronic notice to provider for possible OT consult) (due to weakness and fluid in legs) Does the patient have a NEW difficulty with getting in/out of bed, walking, or climbing stairs that is expected to last >3 days?: No Does the patient have a NEW difficulty with communication that is expected to last >3 days?: No Is the patient deaf or have difficulty hearing?: No Does the patient have difficulty seeing, even when wearing glasses/contacts?: No Does the patient have difficulty concentrating, remembering, or making decisions?: Yes (forgetful)  Permission Sought/Granted Permission sought to share information with : Case Manager Permission granted to share information with : Yes, Verbal Permission Granted              Emotional Assessment   Attitude/Demeanor/Rapport: Engaged Affect (typically observed): Appropriate Orientation: : Oriented to Self, Oriented to Place, Oriented to  Time, Oriented to Situation Alcohol / Substance Use: Not Applicable Psych Involvement: No (comment)  Admission diagnosis:  Melena [K92.1] Symptomatic anemia [D64.9] Gastrointestinal hemorrhage, unspecified gastrointestinal hemorrhage type [K92.2] Patient Active Problem List   Diagnosis Date Noted   Acute exacerbation of CHF (  congestive heart failure) (HCC) 01/16/2023   Chronic hypoxic respiratory failure (HCC) 11/25/2022   Renal cell carcinoma s/p nephrectomy 2003(HCC) 11/25/2022   Anemia due to chronic blood loss 11/05/2022   Acute on chronic anemia 11/04/2022   Herpes zoster 09/02/2022   Decreased hearing of right ear 07/25/2022   DNR (do not resuscitate) 07/25/2022   Bilateral impacted cerumen 05/20/2022   Chronic hypoxic respiratory  failure, on home oxygen therapy (HCC) 04/03/2022   Primary pancreatic neuroendocrine tumor 03/20/2022   Osteoporosis 10/11/2021   Squamous cell carcinoma of foot, 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   Acute gout due to renal impairment involving left foot 01/30/2021   Vertigo 12/05/2020   History of blood transfusion reaction 04/17/2020   Angiodysplasia of small intestine    Gastric polyps    GI bleed 03/28/2020   Palliative care by specialist    Advanced directives, counseling/discussion    Symptomatic anemia 01/06/2020   History of gastritis 11/11/2019   Chronic venous hypertension w ulceration (HCC)    Acute on chronic diastolic CHF (congestive heart failure) (HCC) 11/04/2018   Chronic bilateral pleural effusions 11/04/2018   Iliac artery stenosis, bilateral (HCC) 07/14/2017   Anemia 11/18/2016   Pulmonary hypertension (HCC) 12/04/2015   Acute cough 08/21/2015   B12 deficiency 05/22/2015   PAD (peripheral artery disease) (HCC)    History of nephrectomy secondary to RCC    TIA (transient ischemic attack) 08/16/2014   Other fatigue 01/14/2014   Chronic diastolic CHF (congestive heart failure) (HCC) 06/04/2013   Mitral valve regurgitation 06/04/2013   Allergic rhinitis 01/01/2013   Chronic cholecystitis with calculus 09/15/2012   Claudication (HCC) 01/21/2012   Lightheadedness 11/26/2011   Hypertension 05/20/2011   HOCM/IHSS 07/14/2009   Hyperlipidemia 04/10/2009   INCONTINENCE, URGE 04/10/2009   Hypertrophic cardiomyopathy (HCC) 10/27/2008   Permanent atrial fibrillation 10/27/2008   SINUS BRADYCARDIA 10/27/2008   Carotid artery stenosis 10/27/2008   COPD (chronic obstructive pulmonary disease) (HCC) 10/27/2008   CKD stage 3b, GFR 30-44 ml/min (HCC) 10/27/2008   ICD (implantable cardioverter-defibrillator) in place 10/27/2008   PCP:  [name], MD Pharmacy:   [facility] [place] [place] - [address] [place] [address] Phone: [phone]  Fax: [phone]     Social Determinants of Health (SDOH) Social History: SDOH Screenings   Food Insecurity: No Food Insecurity (01/30/2023)  Housing: Low Risk  (01/30/2023)  Transportation Needs: No Transportation Needs (01/30/2023)  Recent Concern: Transportation Needs - Unmet Transportation Needs (01/16/2023)  Utilities: Not At Risk (01/30/2023)  Depression (PHQ2-9): Low Risk  (12/09/2022)  Financial Resource Strain: Low Risk  (05/28/2022)  Physical Activity: Inactive (09/24/2021)  Stress: No Stress Concern Present (09/24/2021)  Tobacco Use: Medium Risk (01/30/2023)   SDOH Interventions:     Readmission Risk Interventions    11/09/2022   12:00 PM 02/15/2021   11:38 AM  Readmission Risk Prevention Plan  Transportation Screening Complete Complete  PCP or Specialist Appt within 5-7 Days  Complete  PCP or Specialist Appt within 3-5 Days Complete   Home Care Screening  Complete  Medication Review (RN CM)  Complete  HRI or Home Care Consult Complete   Social Work Consult for Recovery Care Planning/Counseling Complete   Medication Review Oceanographer) Complete

## 2023-01-31 NOTE — Plan of Care (Signed)

## 2023-02-01 DIAGNOSIS — D649 Anemia, unspecified: Secondary | ICD-10-CM | POA: Diagnosis not present

## 2023-02-01 LAB — BASIC METABOLIC PANEL
Anion gap: 9 (ref 5–15)
BUN: 60 mg/dL — ABNORMAL HIGH (ref 8–23)
CO2: 26 mmol/L (ref 22–32)
Calcium: 8.6 mg/dL — ABNORMAL LOW (ref 8.9–10.3)
Chloride: 100 mmol/L (ref 98–111)
Creatinine, Ser: 1.8 mg/dL — ABNORMAL HIGH (ref 0.44–1.00)
GFR, Estimated: 27 mL/min — ABNORMAL LOW (ref >60)
Glucose, Bld: 115 mg/dL — ABNORMAL HIGH (ref 70–99)
Potassium: 4 mmol/L (ref 3.5–5.1)
Sodium: 135 mmol/L (ref 135–145)

## 2023-02-01 LAB — CBC
HCT: 26.2 % — ABNORMAL LOW (ref 36.0–46.0)
HCT: 26.5 % — ABNORMAL LOW (ref 36.0–46.0)
Hemoglobin: 7.8 g/dL — ABNORMAL LOW (ref 12.0–15.0)
Hemoglobin: 7.8 g/dL — ABNORMAL LOW (ref 12.0–15.0)
MCH: 27.8 pg (ref 26.0–34.0)
MCH: 27.2 pg (ref 26.0–34.0)
MCHC: 29.8 g/dL — ABNORMAL LOW (ref 30.0–36.0)
MCHC: 29.4 g/dL — ABNORMAL LOW (ref 30.0–36.0)
MCV: 93.2 fL (ref 80.0–100.0)
MCV: 92.3 fL (ref 80.0–100.0)
Platelets: 135 10*3/uL — ABNORMAL LOW (ref 150–400)
Platelets: 136 10*3/uL — ABNORMAL LOW (ref 150–400)
RBC: 2.81 MIL/uL — ABNORMAL LOW (ref 3.87–5.11)
RBC: 2.87 MIL/uL — ABNORMAL LOW (ref 3.87–5.11)
RDW: 20.2 % — ABNORMAL HIGH (ref 11.5–15.5)
RDW: 20.3 % — ABNORMAL HIGH (ref 11.5–15.5)
WBC: 7.3 10*3/uL (ref 4.0–10.5)
WBC: 4.9 10*3/uL (ref 4.0–10.5)
nRBC: 0.7 % — ABNORMAL HIGH (ref 0.0–0.2)
nRBC: 1 % — ABNORMAL HIGH (ref 0.0–0.2)

## 2023-02-01 LAB — MAGNESIUM: Magnesium: 2.1 mg/dL (ref 1.7–2.4)

## 2023-02-01 NOTE — Plan of Care (Signed)

## 2023-02-01 NOTE — Progress Notes (Signed)
PROGRESS NOTE    [name]  UJW:[id number] DOB: [birth year] DOA: 01/30/2023 PCP: [name], MD  [name] w chronic diastolic CHF with RV failure (EF 65-70% on 01/16/2023) s/p PPM/AICD, pulmonary HTN, permanent atrial fibrillation not on AC due to chronic GI bleeding/AVMs, chronic anemia with antibody/history of transfusion reactions, COPD/ILD, chronic hypoxic respiratory failure on 2-3 L O2 via [place], Pancreatic CA, CKD stage IIIb, HLD, RCC s/p right nephrectomy, history of TIA who presented from the heart failure clinic for evaluation of symptomatic GI bleeding. -has chronic anemia related to chronic GI bleed from small bowel AVMs.  She has been admitted 3 times since this past July for issues related to this most recently 9/11-9/14. She was seen by [facility] who recommended against repeat endoscopic evaluation.  -Also reported dyspnea with minimal exertion. progressively increased swelling of both of her legs.  seen in CHF clinic and sent to the ED for further evaluation.  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 Barron Alvine who also recommended to avoid any endoscopic procedures given her advanced age, significant comorbidities -Hemoglobin 6.6 on admission, transfused 2 units PRBC -Anemia panel last few months have noted iron deficiency, repeat anemia panel 9/27 likely erroneous after transfusion -Given IV iron yesterday, repeat tomorrow -Repeat CBC this evening, anticipate need for additional  transfusion this admission -Follow-up with heme-onc Dr. Mosetta Putt scheduled for 9/30, she also gets Procrit shots   Acute on chronic diastolic CHF with RV failure: Severe pulmonary hypertension -With volume overload at this time, predominantly RV  -TTE 01/16/2023 showed EF 65-70%, severely elevated pulmonary artery systolic pressure.  Mildly reduced RV, moderate mitral stenosis -Continue IV Lasix 80 Mg twice daily today, cannot place Unna boots on account of leg wounds -Off SGLT2i with UTIs -Recommended palliative consult for goals of care given chronic disease burden, updated granddaughter yesterday   Permanent atrial fibrillation S/p PPM/AICD: Not on anticoagulation due to chronic GI bleeding and anemia.  PPM in place, ICD has been deactivated per cardiology documentation.  Rate is currently controlled off of rate/rhythm controlling agents.   CKD stage IIIb Hx renal cell carcinoma s/p right nephrectomy: Stable, monitor   COPD/ILD Pulmonary hypertension Chronic hypoxic respiratory failure on 2-3 with O2 via [place] at baseline: Currently stable.  Not on maintenance inhalers as an outpatient.   Pancreatic neuroendocrine tumor: Follows with oncology Dr. Mosetta Putt as an outpatient.  Currently on palliative Sandostatin injections. -Last CT 7/21 noted enlarging pancreatic mass with indeterminate liver masses  -I think her overall prognosis is very poor with significant chronic disease burden   CAD History of TIA Carotid artery stenosis: Continue rosuvastatin 40 mg daily.  Not on aspirin.   Generalized weakness/deconditioning/debility/goals of care: Secondary to CHF with presumed end-stage RV failure and chronic GI bleeding with recurrent symptomatic anemia.  Pancreatic CA -She is DNR, recommended palliative consult for goals of care  DVT prophylaxis: SCDs Code Status: DNR Family Communication: None present, called and updated granddaughter yesterday Disposition Plan: Home likely 48  hours  Consultants:    Procedures:   Antimicrobials:  Objective: Vitals:   01/31/23 2104 01/31/23 2349 02/01/23 0416 02/01/23 0913  BP: (!) 150/59 120/74 127/72 120/85  Pulse: 62 60 60 60  Resp:    17  Temp: 97.9 F (36.6 C) (!) 97.2 F (36.2 C) (!) 97.1 F (36.2 C) 98 F (36.7 C)  TempSrc: Oral Oral Oral Oral  SpO2: 97% 99% 95% 100%  Weight:   64.2 kg   Height:        Intake/Output Summary (Last 24 hours) at 02/01/2023 1035 Last data filed at 02/01/2023 1308 Gross per 24 hour  Intake 120 ml  Output 1250 ml  Net -1130 ml   Filed Weights   01/30/23 1614 01/31/23 0520 02/01/23 0416  Weight: 63.3 kg 63.6 kg 64.2 kg    Examination:  General exam: Chronically ill elderly female appears debilitated, HEENT: Positive JVD CVS: S1-S2, irregular rhythm Lungs: Few basilar Rales Abdomen: Soft, nontender, bowel sounds present Extremities: 2+ edema, small superficial wounds on lower legs Psychiatry:  Mood & affect appropriate.     Data Reviewed:   CBC: Recent Labs  Lab 01/30/23 1618 01/30/23 1628 01/31/23 0843 02/01/23 0307  WBC 4.6  --  9.0 7.3  HGB 6.6* 7.8* 8.9* 7.8*  HCT 23.1* 23.0* 30.2* 26.2*  MCV 96.3  --  91.8 93.2  PLT 175  --  168 135*   Basic Metabolic Panel: Recent Labs  Lab 01/30/23 1618 01/30/23 1628 01/31/23 0843 02/01/23 0307  NA 136 137 137 135  K 4.4 4.4 3.9 4.0  CL 98 100 100 100  CO2 27  --  23 26  GLUCOSE 104* 101* 227* 115*  BUN 53* 52* 53* 60*  CREATININE 1.62* 1.70* 1.75* 1.80*  CALCIUM 8.7*  --  9.1 8.6*  MG  --   --  2.3 2.1   GFR: Estimated Creatinine Clearance: 21 mL/min (A) (by C-G formula based on SCr of 1.8 mg/dL (H)). Liver Function Tests: Recent Labs  Lab 01/30/23 1618  AST 16  ALT 10  ALKPHOS 38  BILITOT 0.9  PROT 6.9  ALBUMIN 3.6   No results for input(s): "LIPASE", "AMYLASE" in the last 168 hours. No results for input(s): "AMMONIA" in the last 168 hours. Coagulation Profile: Recent Labs  Lab  01/30/23 1621  INR 1.1   Cardiac Enzymes: No results for input(s): "CKTOTAL", "CKMB", "CKMBINDEX", "TROPONINI" in the last 168 hours. BNP (last 3 results) No results for input(s): "PROBNP" in the last 8760 hours. HbA1C: No results for input(s): "HGBA1C" in the last 72 hours. CBG: No results for input(s): "GLUCAP" in the last 168 hours. Lipid Profile: No results for input(s): "CHOL", "HDL", "LDLCALC", "TRIG", "CHOLHDL", "LDLDIRECT" in the last 72 hours. Thyroid Function Tests: No results for input(s): "TSH", "T4TOTAL", "FREET4", "T3FREE", "THYROIDAB" in the last 72 hours. Anemia Panel: Recent Labs    01/31/23 0843  FERRITIN 31  TIBC 375  IRON 322*   Urine analysis:    Component Value Date/Time   COLORURINE YELLOW 08/21/2022 1300   APPEARANCEUR CLEAR 08/21/2022 1300   APPEARANCEUR Cloudy (A) 01/27/2018 1330   LABSPEC 1.016 08/21/2022 1300   PHURINE 5.0 08/21/2022 1300   GLUCOSEU NEGATIVE 08/21/2022 1300   HGBUR NEGATIVE 08/21/2022 1300   HGBUR large 11/16/2009 1202   BILIRUBINUR NEGATIVE 08/21/2022 1300   BILIRUBINUR Negative 11/08/2021 1237   BILIRUBINUR Negative 01/27/2018 1330   KETONESUR NEGATIVE 08/21/2022 1300   PROTEINUR NEGATIVE 08/21/2022 1300   UROBILINOGEN 0.2 11/08/2021 1237   UROBILINOGEN 1.0 08/16/2014 1754   NITRITE  NEGATIVE 08/21/2022 1300   LEUKOCYTESUR MODERATE (A) 08/21/2022 1300   Sepsis Labs: @LABRCNTIP (procalcitonin:4,lacticidven:4)  )No results found for this or any previous visit (from the past 240 hour(s)).   Radiology Studies: DG Chest Portable 1 View  Result Date: 01/30/2023 CLINICAL DATA:  Shortness of breath EXAM: PORTABLE CHEST 1 VIEW COMPARISON:  01/16/2023, CT 10/07/2018 FINDINGS: Left-sided multi lead pacing device. Cardiomegaly with small bilateral effusions. Vascular congestion and probable mild interstitial edema. Numerous calcified nodules consistent with granulomas. No pneumothorax. IMPRESSION: Cardiomegaly with vascular  congestion and probable mild interstitial edema. Small bilateral effusions. Electronically Signed   By: Jasmine Pang M.D.   On: 01/30/2023 20:12     Scheduled Meds:  furosemide  80 mg Intravenous BID   pantoprazole  40 mg Oral Daily   potassium chloride  40 mEq Oral q morning   And   potassium chloride  20 mEq Oral QPM   rosuvastatin  40 mg Oral QPM   sodium chloride flush  3 mL Intravenous Q12H   Continuous Infusions:   LOS: 2 days    Time spent:    Zannie Cove, MD [facility]   02/01/2023, 10:35 AM

## 2023-02-01 NOTE — Plan of Care (Signed)
  Problem: Education: Goal: Understanding of CV disease, CV risk reduction, and recovery process will improve Outcome: Progressing   Problem: Activity: Goal: Risk for activity intolerance will decrease Outcome: Progressing   Problem: Nutrition: Goal: Adequate nutrition will be maintained Outcome: Progressing

## 2023-02-02 DIAGNOSIS — D649 Anemia, unspecified: Secondary | ICD-10-CM | POA: Diagnosis not present

## 2023-02-02 DIAGNOSIS — Z7189 Other specified counseling: Secondary | ICD-10-CM

## 2023-02-02 DIAGNOSIS — Z515 Encounter for palliative care: Secondary | ICD-10-CM | POA: Diagnosis not present

## 2023-02-02 LAB — BASIC METABOLIC PANEL
Anion gap: 6 (ref 5–15)
BUN: 57 mg/dL — ABNORMAL HIGH (ref 8–23)
CO2: 28 mmol/L (ref 22–32)
Calcium: 8.4 mg/dL — ABNORMAL LOW (ref 8.9–10.3)
Chloride: 101 mmol/L (ref 98–111)
Creatinine, Ser: 1.59 mg/dL — ABNORMAL HIGH (ref 0.44–1.00)
GFR, Estimated: 31 mL/min — ABNORMAL LOW (ref >60)
Glucose, Bld: 97 mg/dL (ref 70–99)
Potassium: 4.1 mmol/L (ref 3.5–5.1)
Sodium: 135 mmol/L (ref 135–145)

## 2023-02-02 LAB — PREPARE RBC (CROSSMATCH)

## 2023-02-02 LAB — CBC
HCT: 25 % — ABNORMAL LOW (ref 36.0–46.0)
HCT: 30.8 % — ABNORMAL LOW (ref 36.0–46.0)
Hemoglobin: 7.4 g/dL — ABNORMAL LOW (ref 12.0–15.0)
Hemoglobin: 9 g/dL — ABNORMAL LOW (ref 12.0–15.0)
MCH: 27.9 pg (ref 26.0–34.0)
MCH: 27.4 pg (ref 26.0–34.0)
MCHC: 29.6 g/dL — ABNORMAL LOW (ref 30.0–36.0)
MCHC: 29.2 g/dL — ABNORMAL LOW (ref 30.0–36.0)
MCV: 94.3 fL (ref 80.0–100.0)
MCV: 93.6 fL (ref 80.0–100.0)
Platelets: 141 10*3/uL — ABNORMAL LOW (ref 150–400)
Platelets: 177 10*3/uL (ref 150–400)
RBC: 2.65 MIL/uL — ABNORMAL LOW (ref 3.87–5.11)
RBC: 3.29 MIL/uL — ABNORMAL LOW (ref 3.87–5.11)
RDW: 20.3 % — ABNORMAL HIGH (ref 11.5–15.5)
RDW: 19.6 % — ABNORMAL HIGH (ref 11.5–15.5)
WBC: 3.8 10*3/uL — ABNORMAL LOW (ref 4.0–10.5)
WBC: 6.2 10*3/uL (ref 4.0–10.5)
nRBC: 0.8 % — ABNORMAL HIGH (ref 0.0–0.2)
nRBC: 0.3 % — ABNORMAL HIGH (ref 0.0–0.2)

## 2023-02-02 MED ORDER — ACETAMINOPHEN 325 MG PO TABS
650.0000 mg | ORAL_TABLET | Freq: Once | ORAL | Status: AC
  Administered 2023-02-02: 650 mg via ORAL
  Filled 2023-02-02: qty 2

## 2023-02-02 MED ORDER — FAMOTIDINE IN NACL 20-0.9 MG/50ML-% IV SOLN
20.0000 mg | Freq: Once | INTRAVENOUS | Status: DC
  Filled 2023-02-02: qty 50

## 2023-02-02 MED ORDER — DIPHENHYDRAMINE HCL 25 MG PO CAPS
25.0000 mg | ORAL_CAPSULE | Freq: Once | ORAL | Status: AC
  Administered 2023-02-02: 25 mg via ORAL
  Filled 2023-02-02: qty 1

## 2023-02-02 MED ORDER — PREDNISONE 20 MG PO TABS
40.0000 mg | ORAL_TABLET | Freq: Once | ORAL | Status: AC
  Administered 2023-02-02: 40 mg via ORAL
  Filled 2023-02-02: qty 2

## 2023-02-02 MED ORDER — SODIUM CHLORIDE 0.9% IV SOLUTION: Freq: Once | INTRAVENOUS | Status: AC

## 2023-02-02 MED ORDER — FAMOTIDINE IN NACL 20-0.9 MG/50ML-% IV SOLN
20.0000 mg | Freq: Once | INTRAVENOUS | Status: AC
  Administered 2023-02-02: 20 mg via INTRAVENOUS
  Filled 2023-02-02: qty 50

## 2023-02-02 NOTE — Plan of Care (Signed)
Problem: Education: Goal: Understanding of CV disease, CV risk reduction, and recovery process will improve Outcome: Progressing Goal: Individualized Educational Video(s) Outcome: Progressing   Problem: Activity: Goal: Ability to return to baseline activity level will improve Outcome: Progressing   Problem: Cardiovascular: Goal: Ability to achieve and maintain adequate cardiovascular perfusion will improve Outcome: Progressing Goal: Vascular access site(s) Level 0-1 will be maintained Outcome: Progressing

## 2023-02-02 NOTE — Progress Notes (Signed)
Notified Dr. Jomarie Longs that blood bank states PRBCs will not be available until later today or tomorrow due to antibodies. MD states to hold ordered Tylenol, Prednisone, and Benadryl until 1 hour before transfusion.

## 2023-02-02 NOTE — Consult Note (Signed)
Palliative Care Consult Note                                  Date: 02/02/2023   Patient Name: [name]  DOB: 1934-04-02  MRN: [id number]  Age / Sex: 89 y.o., female  PCP: [name], MD Referring Physician: Zannie Cove, MD  Reason for Consultation: Establishing goals of care  HPI/Patient Profile: 89 y.o. female  with past medical history of  chronic diastolic CHF with RV failure (EF 65-70% on 01/16/2023) s/p PPM/AICD, pulmonary HTN, permanent atrial fibrillation not on AC due to chronic GI bleeding/AVMs, chronic anemia with antibody/history of transfusion reactions, COPD/ILD, chronic hypoxic respiratory failure on 2-3 L O2 via [place], Pancreatic CA, CKD stage IIIb, HLD, RCC s/p right nephrectomy, and history of TIA  admitted on 01/30/2023 from the heart failure clinic for evaluation of symptomatic GI bleed.   Past Medical History:  Diagnosis Date   (HFpEF) heart failure with preserved ejection fraction (HCC)    EF=60-60%   Actinic keratosis 01/17/2015   R forearm   Adjustment disorder with anxiety    Adverse effect of other narcotics, sequela    Intolerance to all narcotics   Anti-Duffy antibodies present    Aortic valve stenosis    Arthritis    "some in my hands" (11/11/2012)   Atrial fibrillation, permanent (HCC)    Eliquis   Atypical mole 03/25/2018   L forearm - severe   Automatic implantable cardioverter-defibrillator in situ    Avascular necrosis of hip (HCC) 05/03/2011   Carotid artery stenosis 09/2007   a. 09/2007: 60-79% bilateral (stable); b. 10/2008: 40-59% R 60-79%    Cellulitis of left lower extremity 07/13/2020   CKD (chronic kidney disease), stage III (HCC)    COPD (chronic obstructive pulmonary disease) (HCC)    Coronary artery disease    non-obstructive by 2006 cath   COVID-19 virus infection 02/12/2022   Displaced fracture of left femoral neck (HCC) 10/09/2018   GI bleed 03/28/2020   AVM   High  cholesterol    Hypertension 05/20/2011   Hypertr obst cardiomyop    Hypotension, unspecified    cardiac cath 2006..nonobstructive CAD 30-40s lesions.Marland KitchenETT 1/09 nondiagnostic due to poor HR response..Right Renal Cancer 2003   Iron deficiency anemia    Long term (current) use of anticoagulants    On home oxygen therapy    3L    Osteoarthritis of right hip    PAD (peripheral artery disease) (HCC)    Pancreatic cancer (HCC)    sandostatin   PONV (postoperative nausea and vomiting)    Presence of permanent cardiac pacemaker    Pulmonary HTN (HCC)    RECTAL BLEEDING 10/13/2009   Qualifier: Diagnosis of  By: Wilmon Pali NP, Paula     Red blood cell antibody positive with compatible PRBC difficult to obtain    Anti FYA (Duffy a) antibody. Must be transfused with PRB which are Duffy Antigen Negative and Crossmatch Compatible   Renal cell carcinoma (HCC)    s/p nephrectomy   Squamous cell carcinoma of skin 01/17/2015   R lat wrist   Squamous cell carcinoma of skin  01/29/2018   R post upper leg - superficially invasive   Squamous cell carcinoma of skin 03/25/2018   L lat foot   Squamous cell carcinoma of skin 11/09/2020   left lat foot - EDC 01/01/21, recurrent 01/31/21 - MOHs 02/22/21   Squamous cell carcinoma of skin 12/19/2021   Right Posterior Medial Thigh, EDC   Squamous cell carcinoma of skin 12/19/2021   SCC IS, L lat heel, EDC 02/04/2022   Squamous cell carcinoma of skin 01/21/2022   L forearm, EDC 02/04/2022   Squamous cell carcinoma of skin 01/21/2022   R lower leg below knee, EDC 02/04/2022   Squamous cell carcinoma of skin 01/21/2022   SCCIS, R post heel, EDC 02/04/2022   Squamous cell carcinoma of skin 07/01/2022   left lower abdomen, in situ, EDC   Squamous cell carcinoma of skin 07/01/2022   left medial chest, EDC   Urge incontinence    Venous stasis of both lower extremities     Subjective:   I have reviewed medical records including EPIC notes, labs and imaging,  received an update from attending physician, assessed the patient and then met in the patient's room with her family to discuss diagnosis prognosis, GOC, EOL wishes, disposition and options.  I introduced Palliative Medicine as specialized medical care for people living with serious illness. It focuses on providing relief from symptoms and stress of a serious illness. The goal is to improve quality of life for both the patient and the family.  Today's Discussion: Patient and her family have a good understanding of her chronic illnesses. I reviewed my understanding, including limitations of ongoing interventions and risk for further decline despite treatment efforts. We discussed  reviewed what this might look like and emphasized the importance of considering her quality of life and what would be acceptable to her in the future.  Verified her DNR/DNI status and discussed scope of care. She would like to continue treating the treatable including transfusions.   Patient would like to go home at discharge but her family is concerned about this. They discussed the pros and cons of going home and of going to a facility. They told the patient they would support her in her decisions either way. They will continue to discuss this as she approaches discharge.  We discussed having outpatient palliative follow up at discharge.  Discussed the importance of continued conversation with family and the medical providers regarding overall plan of care and treatment options, ensuring decisions are within the context of the patient's values and GOCs.  Questions and concerns were addressed. The  patient and family was encouraged to call with questions or concerns. PMT will continue to support holistically.  Review of Systems  Constitutional:  Positive for activity change, appetite change (patient has increased appetite) and fatigue.  Cardiovascular:  Positive for leg swelling.    Objective:   Primary  Diagnoses: Present on Admission:  Symptomatic anemia  COPD (chronic obstructive pulmonary disease) (HCC)  Permanent atrial fibrillation  CKD stage 3b, GFR 30-44 ml/min (HCC)  Pulmonary hypertension (HCC)  Acute on chronic diastolic CHF (congestive heart failure) (HCC)  Primary pancreatic neuroendocrine tumor  DNR (do not resuscitate)  Anemia due to chronic blood loss   Physical Exam Vitals reviewed.  HENT:     Head: Normocephalic and atraumatic.  Cardiovascular:     Rate and Rhythm: Normal rate.  Pulmonary:     Effort: Pulmonary effort is normal.  Musculoskeletal:     Right lower leg: Edema  present.     Left lower leg: Edema present.  Skin:    General: Skin is warm and dry.     Comments: Venous statis  Neurological:     Mental Status: She is alert and oriented to person, place, and time.  Psychiatric:        Mood and Affect: Mood normal.        Behavior: Behavior normal.        Thought Content: Thought content normal.        Judgment: Judgment normal.     Vital Signs:  BP 130/61 (BP Location: Right Arm)   Pulse 60   Temp 97.7 F (36.5 C) (Oral)   Resp 19   Ht 5\' 6"  (1.676 m)   Wt 63 kg   SpO2 98%   BMI 22.42 kg/m   Palliative Assessment/Data: 50%    Advanced Care Planning:   Existing Vynca/ACP Documentation: DNR  Primary Decision Maker: PATIENT  Code Status/Advance Care Planning: DNR  Assessment & Plan:  SUMMARY OF RECOMMENDATIONS   DNR/DNI Continue treating the treatable Encouraged patient and family to continue to discuss goals of care and disposition options Outpatient palliative care- which one is dependent on disposition Continued PMT support  Discussed with: Dr. Jomarie Longs  Time Total: 80 minutes   Thank you for allowing [place] to participate in the care of [name] PMT will continue to support holistically.   Signed by: Sarina Ser, NP Palliative Medicine Team  Team Phone # [phone] (Nights/Weekends)  02/02/2023, 8:58 AM

## 2023-02-02 NOTE — Progress Notes (Addendum)
PROGRESS NOTE    [name]  GEX:[id number] DOB: 17-Oct-1933 DOA: 01/30/2023 PCP: [name], MD  [name] w chronic diastolic CHF with RV failure (EF 65-70% on 01/16/2023) s/p PPM/AICD, pulmonary HTN, permanent atrial fibrillation not on AC due to chronic GI bleeding/AVMs, chronic anemia with antibody/history of transfusion reactions, COPD/ILD, chronic hypoxic respiratory failure on 2-3 L O2 via [place], Pancreatic CA, CKD stage IIIb, HLD, RCC s/p right nephrectomy, history of TIA who presented from the heart failure clinic for evaluation of symptomatic GI bleeding. -has chronic anemia related to chronic GI bleed from small bowel AVMs.  She has been admitted 3 times since this past July for issues related to this most recently 9/11-9/14. She was seen by [facility] who recommended against repeat endoscopic evaluation.  -Also reported dyspnea with minimal exertion. progressively increased swelling of both of her legs.  seen in CHF clinic and sent to the ED for further evaluation.  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 Barron Alvine who also recommended trying to avoid further endoscopies -Hemoglobin 6.6 on admission, transfused 2 units PRBC -Anemia panel last few months have noted iron deficiency, repeat anemia panel 9/27 likely erroneous after transfusion -Given IV iron yesterday, repeat today after blood -Transfuse another unit of PRBC today -Follow-up with heme-onc Dr. Mosetta Putt scheduled for 9/30, she also  gets Procrit shots   Acute on chronic diastolic CHF with RV failure: Severe pulmonary hypertension -With volume overload at this time, predominantly RV  -TTE 01/16/2023 showed EF 65-70%, severely elevated pulmonary artery systolic pressure.  Mildly reduced RV, moderate mitral stenosis -Continue IV Lasix 80 Mg twice daily today, cannot place Unna boots on account of leg wounds, volume status improving -Off SGLT2i with UTIs -Recommended palliative consult for goals of care given chronic disease burden, updated granddaughter    Permanent atrial fibrillation S/p PPM/AICD: Not on anticoagulation due to chronic GI bleeding and anemia.  PPM in place, ICD has been deactivated per cardiology documentation.  Rate is currently controlled off of rate/rhythm controlling agents.   CKD stage IIIb Hx renal cell carcinoma s/p right nephrectomy: Stable, monitor   COPD/ILD Pulmonary hypertension Chronic hypoxic respiratory failure on 2-3 with O2 via [place] at baseline: Currently stable.  Not on maintenance inhalers as an outpatient.   Pancreatic neuroendocrine tumor: Follows with oncology Dr. Mosetta Putt as an outpatient.  Currently on palliative Sandostatin injections. -Last CT 7/21 noted enlarging pancreatic mass with indeterminate liver masses  -I think her overall prognosis is very poor with significant chronic disease burden   CAD History of TIA Carotid artery stenosis: Continue rosuvastatin 40 mg daily.  Not on aspirin.   Generalized weakness/deconditioning/debility/goals of care: Secondary to CHF with presumed end-stage RV failure and chronic GI bleeding with recurrent symptomatic anemia.  Pancreatic CA -She is DNR, recommended palliative consult for goals of care  DVT prophylaxis: SCDs Code Status: DNR Family Communication: None present, called and updated granddaughter 9/27 Disposition Plan: Home likely 48 hours  Consultants:    Procedures:   Antimicrobials:    Objective: Vitals:    02/01/23 1730  02/01/23 1918 02/02/23 0635 02/02/23 0822  BP: 122/74  (!) 103/56 130/61  Pulse: 60  (!) 58 60  Resp:  18 (!) 21 19  Temp: 98 F (36.7 C) 98.3 F (36.8 C) 98.3 F (36.8 C) 97.7 F (36.5 C)  TempSrc: Oral Oral Oral Oral  SpO2: 100%  100% 98%  Weight:   63 kg   Height:        Intake/Output Summary (Last 24 hours) at 02/02/2023 1212 Last data filed at 02/02/2023 0700 Gross per 24 hour  Intake 600 ml  Output 2550 ml  Net -1950 ml   Filed Weights   01/31/23 0520 02/01/23 0416 02/02/23 0635  Weight: 63.6 kg 64.2 kg 63 kg    Examination:  General exam: Chronically ill elderly female sitting up in bed, AAOx3 HEENT: Positive JVD CVS: S1-S2, irregular rhythm Lungs: Few basilar Rales Abdomen: Soft, nontender, bowel sounds present Extremities: 2+ edema , small superficial wounds on lower legs Psychiatry:  Mood & affect appropriate.     Data Reviewed:   CBC: Recent Labs  Lab 01/30/23 1618 01/30/23 1628 01/31/23 0843 02/01/23 0307 02/01/23 1223 02/02/23 0251  WBC 4.6  --  9.0 7.3 4.9 3.8*  HGB 6.6* 7.8* 8.9* 7.8* 7.8* 7.4*  HCT 23.1* 23.0* 30.2* 26.2* 26.5* 25.0*  MCV 96.3  --  91.8 93.2 92.3 94.3  PLT 175  --  168 135* 136* 141*   Basic Metabolic Panel: Recent Labs  Lab 01/30/23 1618 01/30/23 1628 01/31/23 0843 02/01/23 0307 02/02/23 0251  NA 136 137 137 135 135  K 4.4 4.4 3.9 4.0 4.1  CL 98 100 100 100 101  CO2 27  --  23 26 28   GLUCOSE 104* 101* 227* 115* 97  BUN 53* 52* 53* 60* 57*  CREATININE 1.62* 1.70* 1.75* 1.80* 1.59*  CALCIUM 8.7*  --  9.1 8.6* 8.4*  MG  --   --  2.3 2.1  --    GFR: Estimated Creatinine Clearance: 23.8 mL/min (A) (by C-G formula based on SCr of 1.59 mg/dL (H)). Liver Function Tests: Recent Labs  Lab 01/30/23 1618  AST 16  ALT 10  ALKPHOS 38  BILITOT 0.9  PROT 6.9  ALBUMIN 3.6   No results for input(s): "LIPASE", "AMYLASE" in the last 168 hours. No results for input(s): "AMMONIA" in the last 168  hours. Coagulation Profile: Recent Labs  Lab 01/30/23 1621  INR 1.1   Cardiac Enzymes: No results for input(s): "CKTOTAL", "CKMB", "CKMBINDEX", "TROPONINI" in the last 168 hours. BNP (last 3 results) No results for input(s): "PROBNP" in the last 8760 hours. HbA1C: No results for input(s): "HGBA1C" in the last 72 hours. CBG: No results for input(s): "GLUCAP" in the last 168 hours. Lipid Profile: No results for input(s): "CHOL", "HDL", "LDLCALC", "TRIG", "CHOLHDL", "LDLDIRECT" in the last 72 hours. Thyroid Function Tests: No results for input(s): "TSH", "T4TOTAL", "FREET4", "T3FREE", "THYROIDAB" in the last 72 hours. Anemia Panel: Recent Labs    01/31/23 0843  FERRITIN 31  TIBC 375  IRON 322*   Urine analysis:    Component Value Date/Time   COLORURINE YELLOW 08/21/2022 1300   APPEARANCEUR CLEAR 08/21/2022 1300   APPEARANCEUR Cloudy (A) 01/27/2018 1330   LABSPEC 1.016 08/21/2022 1300   PHURINE 5.0 08/21/2022 1300   GLUCOSEU NEGATIVE 08/21/2022 1300   HGBUR NEGATIVE 08/21/2022 1300   HGBUR large 11/16/2009 1202   BILIRUBINUR NEGATIVE 08/21/2022 1300   BILIRUBINUR Negative 11/08/2021 1237   BILIRUBINUR Negative 01/27/2018 1330  KETONESUR NEGATIVE 08/21/2022 1300   PROTEINUR NEGATIVE 08/21/2022 1300   UROBILINOGEN 0.2 11/08/2021 1237   UROBILINOGEN 1.0 08/16/2014 1754   NITRITE NEGATIVE 08/21/2022 1300   LEUKOCYTESUR MODERATE (A) 08/21/2022 1300   Sepsis Labs: @LABRCNTIP (procalcitonin:4,lacticidven:4)  )No results found for this or any previous visit (from the past 240 hour(s)).   Radiology Studies: No results found.   Scheduled Meds:  sodium chloride   Intravenous Once   acetaminophen  650 mg Oral Once   diphenhydrAMINE  25 mg Oral Once   furosemide  80 mg Intravenous BID   pantoprazole  40 mg Oral Daily   potassium chloride  40 mEq Oral q morning   And   potassium chloride  20 mEq Oral QPM   predniSONE  40 mg Oral Once   rosuvastatin  40 mg Oral QPM    sodium chloride flush  3 mL Intravenous Q12H   Continuous Infusions:   LOS: 3 days    Time spent:    Zannie Cove, MD [facility]   02/02/2023, 12:12 PM

## 2023-02-02 NOTE — Assessment & Plan Note (Deleted)
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   -Outpatient EUS 03/04/2022 by Dr. 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 Freida Busman did not recommend surgery in multifocal NET and also due to her age and co-morbidities. -She began first line sandostatin injections q28 days on 03/27/22.  -Dotatate PET scan, done 2 weeks after first injection 12/6, shows no significant tracer avidity in the pancrease or distant sites. Findings are likely due to close timing relative to the sandostatin injection blocking receptors. When we repeat this for restaging next year, will arrange 4 weeks after sandostatin -She has persistent anemia, likely secondary to GI bleeding from the tumor.   -she was recently hospitalized on 01/15/2023 for CHF exacerbation and anemia which required blood transfusion

## 2023-02-03 ENCOUNTER — Inpatient Hospital Stay: Payer: PPO | Admitting: Hematology

## 2023-02-03 ENCOUNTER — Inpatient Hospital Stay: Payer: PPO

## 2023-02-03 ENCOUNTER — Encounter: Payer: Self-pay | Admitting: Hematology

## 2023-02-03 DIAGNOSIS — Z515 Encounter for palliative care: Secondary | ICD-10-CM | POA: Diagnosis not present

## 2023-02-03 DIAGNOSIS — D3A8 Other benign neuroendocrine tumors: Secondary | ICD-10-CM

## 2023-02-03 DIAGNOSIS — Z7189 Other specified counseling: Secondary | ICD-10-CM | POA: Diagnosis not present

## 2023-02-03 DIAGNOSIS — D649 Anemia, unspecified: Secondary | ICD-10-CM | POA: Diagnosis not present

## 2023-02-03 LAB — TYPE AND SCREEN
ABO/RH(D): A NEG
Antibody Screen: POSITIVE
Unit division: 0
Unit division: 0
Unit division: 0

## 2023-02-03 LAB — CBC
HCT: 28.7 % — ABNORMAL LOW (ref 36.0–46.0)
Hemoglobin: 8.7 g/dL — ABNORMAL LOW (ref 12.0–15.0)
MCH: 28.3 pg (ref 26.0–34.0)
MCHC: 30.3 g/dL (ref 30.0–36.0)
MCV: 93.5 fL (ref 80.0–100.0)
Platelets: 158 10*3/uL (ref 150–400)
RBC: 3.07 MIL/uL — ABNORMAL LOW (ref 3.87–5.11)
RDW: 19.6 % — ABNORMAL HIGH (ref 11.5–15.5)
WBC: 4.4 10*3/uL (ref 4.0–10.5)
nRBC: 0 % (ref 0.0–0.2)

## 2023-02-03 LAB — BPAM RBC
Blood Product Expiration Date: 202410242359
Blood Product Expiration Date: 202410242359
Blood Product Expiration Date: 202410292359
Blood Product Expiration Date: 202410292359
ISSUE DATE / TIME: 202409291453
Unit Type and Rh: 600
Unit Type and Rh: 600
Unit Type and Rh: 600
Unit Type and Rh: 600

## 2023-02-03 LAB — BASIC METABOLIC PANEL
Anion gap: 7 (ref 5–15)
BUN: 53 mg/dL — ABNORMAL HIGH (ref 8–23)
CO2: 28 mmol/L (ref 22–32)
Calcium: 8.6 mg/dL — ABNORMAL LOW (ref 8.9–10.3)
Chloride: 100 mmol/L (ref 98–111)
Creatinine, Ser: 1.89 mg/dL — ABNORMAL HIGH (ref 0.44–1.00)
GFR, Estimated: 26 mL/min — ABNORMAL LOW (ref >60)
Glucose, Bld: 148 mg/dL — ABNORMAL HIGH (ref 70–99)
Potassium: 4.6 mmol/L (ref 3.5–5.1)
Sodium: 135 mmol/L (ref 135–145)

## 2023-02-03 MED ORDER — SODIUM CHLORIDE 0.9 % IV SOLN
100.0000 mg | Freq: Once | INTRAVENOUS | Status: AC
  Administered 2023-02-03: 100 mg via INTRAVENOUS
  Filled 2023-02-03: qty 5

## 2023-02-03 NOTE — Progress Notes (Signed)
PROGRESS NOTE    [name]  PPI:[id number] DOB: Dec 03, 1933 DOA: 01/30/2023 PCP: [name], MD  [name] w chronic diastolic CHF with RV failure (EF 65-70% on 01/16/2023) s/p PPM/AICD, pulmonary HTN, permanent atrial fibrillation not on AC due to chronic GI bleeding/AVMs, chronic anemia with antibody/history of transfusion reactions, COPD/ILD, chronic hypoxic respiratory failure on 2-3 L O2 via [place], Pancreatic CA, CKD stage IIIb, HLD, RCC s/p right nephrectomy, history of TIA who presented from the heart failure clinic for evaluation of symptomatic GI bleeding. -has chronic anemia related to chronic GI bleed from small bowel AVMs.  She has been admitted 3 times since this past July for issues related to this most recently 9/11-9/14. She followed by [facility] -Also reported dyspnea with minimal exertion. progressively increased swelling of both of her legs.  seen in CHF clinic and sent to the ED for further evaluation.  In the ED, hemoglobin 6.6, creatinine 1.6, BNP 322, chest x-ray with cardiomegaly pulmonary vascular congestion and interstitial edema -Admitted, transfused 2 units PRBC 9/26, started on diuretics -Transfused PRBC again 9/29   Subjective: -Melena x 1 yesterday, multiple times over this admission  Assessment and Plan:  Acute on chronic iron deficiency anemia Chronic GI bleeding due to small bowel AVMs: Recurrent symptomatic anemia likely due to chronic GI bleed from small bowel AVMs, multiple hospitalizations for same, she had discussions with her primary gastroenterologist Dr. Barron Alvine who also recommended trying to avoid further endoscopies -Last small bowel enteroscopy 7/24 with multiple duodenal and jejunal AVMs treated with APC -Hemoglobin 6.6 on admission, transfused 3 units of PRBC this admission so far -Anemia panel last few months have noted iron deficiency, repeat anemia panel 9/27 likely erroneous after transfusion -Will repeat IV iron today -Seen by  palliative care, not ready for hospice, will discuss with GI again regarding options, seems to be limited -Follow-up with heme-onc Dr. Mosetta Putt scheduled for 9/30, she also gets Procrit shots -Poor prognosis has been discussed with patient and family   Acute on chronic diastolic CHF with RV failure: Severe pulmonary hypertension -With volume overload at this time, predominantly RV  -TTE 01/16/2023 showed EF 65-70%, severely elevated pulmonary artery systolic pressure.  Mildly reduced RV, moderate mitral stenosis -Continue IV Lasix 80 Mg twice daily today, cannot place Unna boots on account of leg wounds, volume status improving -Off SGLT2i with UTIs -Recommended palliative consult for goals of care given chronic disease burden, updated granddaughter  -Seen by palliative care yesterday, she plans to continue current care, not ready for hospice   Permanent atrial fibrillation S/p PPM/AICD: Not on anticoagulation due to chronic GI bleeding and anemia.  PPM in place, ICD has been deactivated per cardiology documentation.  Rate is currently controlled off of rate/rhythm controlling agents.   CKD stage IIIb Hx renal cell carcinoma s/p right nephrectomy: Stable, monitor   COPD/ILD Pulmonary hypertension Chronic hypoxic respiratory failure on 2-3 with O2 via [place] at baseline: Currently stable.  Not on maintenance inhalers as an outpatient.   Pancreatic neuroendocrine tumor: Follows with oncology Dr. Mosetta Putt as an outpatient.  Currently on palliative Sandostatin injections. -Last CT 7/21 noted enlarging pancreatic mass with indeterminate liver masses  -I think her overall prognosis is very poor with significant chronic disease burden   CAD History of TIA Carotid artery stenosis: Continue rosuvastatin 40 mg daily.  Not on aspirin.   Generalized weakness/deconditioning/debility/goals of care: Secondary to CHF with presumed end-stage RV failure and chronic GI bleeding with recurrent symptomatic  anemia.  Pancreatic CA -She is DNR, recommended palliative consult for goals of care, she is leaning towards outpatient palliative care, not ready for hospice  DVT prophylaxis: SCDs Code Status: DNR Family Communication: None present, called and updated granddaughter 9/27, will reattempt again Disposition Plan: Home likely 48 hours  Consultants:    Procedures:   Antimicrobials:    Objective: Vitals:   02/03/23 0123 02/03/23 0426 02/03/23 0905 02/03/23 1211  BP: 104/85 (!) 155/76 (!) 140/65 117/70  Pulse: 60 (!) 59 62 61  Resp: 18 16 19 18   Temp: 98.5 F (36.9 C) 98.5 F (36.9 C) 98.5 F (36.9 C) 98.5 F (36.9 C)  TempSrc: Oral Oral Oral Oral  SpO2: 100% 98% 96% 96%  Weight:  62 kg    Height:        Intake/Output Summary (Last 24 hours) at 02/03/2023 1227 Last data filed at 02/03/2023 1005 Gross per 24 hour  Intake 586.67 ml  Output 2300 ml  Net -1713.33 ml   Filed Weights   02/01/23 0416 02/02/23 0635 02/03/23 0426  Weight: 64.2 kg 63 kg 62 kg    Examination:  General exam: Chronically ill elderly female sitting up in bed, AAOx3 HEENT: Positive JVD CVS: S1-S2, irregular rhythm Lungs: Few basilar Rales Abdomen: Soft, nontender, bowel sounds present Extremities: 2+ edema , small superficial wounds on lower legs Psychiatry:  Mood & affect appropriate.     Data Reviewed:   CBC: Recent Labs  Lab 02/01/23 0307 02/01/23 1223 02/02/23 0251 02/02/23 2141 02/03/23 0356  WBC 7.3 4.9 3.8* 6.2 4.4  HGB 7.8* 7.8* 7.4* 9.0* 8.7*  HCT 26.2* 26.5* 25.0* 30.8* 28.7*  MCV 93.2 92.3 94.3 93.6 93.5  PLT 135* 136* 141* 177 158   Basic Metabolic Panel: Recent Labs  Lab 01/30/23 1618 01/30/23 1628 01/31/23 0843 02/01/23 0307 02/02/23 0251 02/03/23 0356  NA 136 137 137 135 135 135  K 4.4 4.4 3.9 4.0 4.1 4.6  CL 98 100 100 100 101 100  CO2 27  --  23 26 28 28   GLUCOSE 104* 101* 227* 115* 97 148*  BUN 53* 52* 53* 60* 57* 53*  CREATININE 1.62* 1.70* 1.75*  1.80* 1.59* 1.89*  CALCIUM 8.7*  --  9.1 8.6* 8.4* 8.6*  MG  --   --  2.3 2.1  --   --    GFR: Estimated Creatinine Clearance: 20 mL/min (A) (by C-G formula based on SCr of 1.89 mg/dL (H)). Liver Function Tests: Recent Labs  Lab 01/30/23 1618  AST 16  ALT 10  ALKPHOS 38  BILITOT 0.9  PROT 6.9  ALBUMIN 3.6   No results for input(s): "LIPASE", "AMYLASE" in the last 168 hours. No results for input(s): "AMMONIA" in the last 168 hours. Coagulation Profile: Recent Labs  Lab 01/30/23 1621  INR 1.1   Cardiac Enzymes: No results for input(s): "CKTOTAL", "CKMB", "CKMBINDEX", "TROPONINI" in the last 168 hours. BNP (last 3 results) No results for input(s): "PROBNP" in the last 8760 hours. HbA1C: No results for input(s): "HGBA1C" in the last 72 hours. CBG: No results for input(s): "GLUCAP" in the last 168 hours. Lipid Profile: No results for input(s): "CHOL", "HDL", "LDLCALC", "TRIG", "CHOLHDL", "LDLDIRECT" in the last 72 hours. Thyroid Function Tests: No results for input(s): "TSH", "T4TOTAL", "FREET4", "T3FREE", "THYROIDAB" in the last 72 hours. Anemia Panel: No results for input(s): "VITAMINB12", "FOLATE", "FERRITIN", "TIBC", "IRON", "RETICCTPCT" in the last 72 hours.  Urine analysis:    Component Value Date/Time   COLORURINE YELLOW 08/21/2022  1300   APPEARANCEUR CLEAR 08/21/2022 1300   APPEARANCEUR Cloudy (A) 01/27/2018 1330   LABSPEC 1.016 08/21/2022 1300   PHURINE 5.0 08/21/2022 1300   GLUCOSEU NEGATIVE 08/21/2022 1300   HGBUR NEGATIVE 08/21/2022 1300   HGBUR large 11/16/2009 1202   BILIRUBINUR NEGATIVE 08/21/2022 1300   BILIRUBINUR Negative 11/08/2021 1237   BILIRUBINUR Negative 01/27/2018 1330   KETONESUR NEGATIVE 08/21/2022 1300   PROTEINUR NEGATIVE 08/21/2022 1300   UROBILINOGEN 0.2 11/08/2021 1237   UROBILINOGEN 1.0 08/16/2014 1754   NITRITE NEGATIVE 08/21/2022 1300   LEUKOCYTESUR MODERATE (A) 08/21/2022 1300   Sepsis  Labs: @LABRCNTIP (procalcitonin:4,lacticidven:4)  )No results found for this or any previous visit (from the past 240 hour(s)).   Radiology Studies: No results found.   Scheduled Meds:  furosemide  80 mg Intravenous BID   pantoprazole  40 mg Oral Daily   potassium chloride  40 mEq Oral q morning   And   potassium chloride  20 mEq Oral QPM   rosuvastatin  40 mg Oral QPM   sodium chloride flush  3 mL Intravenous Q12H   Continuous Infusions:   LOS: 4 days    Time spent:    Zannie Cove, MD [facility]   02/03/2023, 12:27 PM

## 2023-02-03 NOTE — Progress Notes (Signed)
Mobility Specialist Progress Note:    02/03/23 1148  Mobility  Activity Ambulated with assistance in hallway  Level of Assistance Standby assist, set-up cues, supervision of patient - no hands on  Assistive Device Front wheel walker  Distance Ambulated (ft) 100 ft  Activity Response Tolerated well  Mobility Referral Yes  $Mobility charge 1 Mobility  Mobility Specialist Start Time (ACUTE ONLY) 1035  Mobility Specialist Stop Time (ACUTE ONLY) 1050  Mobility Specialist Time Calculation (min) (ACUTE ONLY) 15 min   Received pt in bed having no complaints and agreeable to mobility. Ambulated on 2L/min SPO2 remained at 99%. Pt was asymptomatic throughout ambulation and returned to room w/o fault. Left seated EOB w/ call bell in reach and all needs met.   Thompson Grayer Mobility Specialist  Please contact vis Secure Chat or  Rehab Office [phone]

## 2023-02-03 NOTE — Progress Notes (Signed)
Occupational Therapy Treatment Patient Details Name: [name] MRN: [id number] DOB: January 02, 1934 Today's Date: 02/03/2023   History of present illness 89 yo female [facility] with tarry stools x2 weeks, dizziness, SOB, weakness. Recent d/c 9/14 for symptomatic anemia. PMHx: upper GIB and transfusion reactions, afib, dCHF, severe tricuspid regurgitation, NICM s/p ICD, COPD on 2-3LO2 at baseline, PAD, CKD3, RCC s/p nephrectomy, ischemic colitis, bilat THA.   OT comments  Patient remains SOB with minimal activity, but with breaks and generalized supervision, she is able to complete in room mobility/toileting and ADL from a sit to stand level.  OT to discontinue in the acute setting, patient's plan is to return home with assist as needed from family.  Patient is contemplating palliative cane versus hospice.          If plan is discharge home, recommend the following:  Assist for transportation;Assistance with cooking/housework   Equipment Recommendations  None recommended by OT    Recommendations for Other Services      Precautions / Restrictions Precautions Precautions: Fall Restrictions Weight Bearing Restrictions: No       Mobility Bed Mobility Overal bed mobility: Modified Independent               Patient Response: Cooperative  Transfers Overall transfer level: Needs assistance   Transfers: Sit to/from Stand, Bed to chair/wheelchair/BSC Sit to Stand: Supervision     Step pivot transfers: Supervision           Balance Overall balance assessment: Needs assistance Sitting-balance support: Feet supported Sitting balance-Leahy Scale: Good     Standing balance support: No upper extremity supported Standing balance-Leahy Scale: Fair                             ADL either performed or assessed with clinical judgement   ADL       Grooming: Wash/dry hands;Wash/dry face;Standing;Supervision/safety               Lower Body  Dressing: Sit to/from stand;Supervision/safety   Toilet Transfer: Regular Toilet;Ambulation;Supervision/safety                  Extremity/Trunk Assessment Upper Extremity Assessment Upper Extremity Assessment: Overall WFL for tasks assessed   Lower Extremity Assessment Lower Extremity Assessment: Defer to PT evaluation        Vision Patient Visual Report: No change from baseline     Perception Perception Perception: Within Functional Limits   Praxis Praxis Praxis: WFL    Cognition Arousal: Alert Behavior During Therapy: WFL for tasks assessed/performed Overall Cognitive Status: Within Functional Limits for tasks assessed                                                             Pertinent Vitals/ Pain       Pain Assessment Pain Assessment: Faces Faces Pain Scale: Hurts little more Pain Location: LEs Pain Descriptors / Indicators: Guarding, Grimacing, Discomfort Pain Intervention(s): Monitored during session  Frequency           Progress Toward Goals  OT Goals(current goals can now be found in the care plan section)  Progress towards OT goals: Progressing toward goals;Goals met/education completed, patient discharged from OT  Acute Rehab OT Goals OT Goal Formulation: With patient Time For Goal Achievement: 02/14/23 Potential to Achieve Goals: Good  Plan      Co-evaluation                 AM-PAC OT "6 Clicks" Daily Activity     Outcome Measure   Help from another person eating meals?: None Help from another person taking care of personal grooming?: None Help from another person toileting, which includes using toliet, bedpan, or urinal?: A Little Help from another person bathing (including washing, rinsing, drying)?: A Little Help from another person to put on and taking off regular upper body clothing?: None Help from another  person to put on and taking off regular lower body clothing?: A Little 6 Click Score: 21    End of Session Equipment Utilized During Treatment: Oxygen  OT Visit Diagnosis: Unsteadiness on feet (R26.81)   Activity Tolerance Patient tolerated treatment well   Patient Left in bed;with call bell/phone within reach;with family/visitor present   Nurse Communication Mobility status        Time: 1355-1411 OT Time Calculation (min): 16 min  Charges: OT General Charges $OT Visit: 1 Visit OT Treatments $Self Care/Home Management : 8-22 mins  02/03/2023  RP, OTR/L  Acute Rehabilitation Services  Office:  [phone]   Suzanna Obey 02/03/2023, 3:34 PM

## 2023-02-03 NOTE — Progress Notes (Signed)
Physical Therapy Treatment Patient Details Name: [name] MRN: [id number] DOB: Oct 10, 1933 Today's Date: 02/03/2023   History of Present Illness 89 yo female presents to [facility] on 9/26 with tarry stools x2 weeks, dizziness, SOB, weakness. Recent d/c 9/14 for symptomatic anemia. In the ED, hemoglobin 6.6, creatinine 1.6, BNP 322, chest x-ray with cardiomegaly pulmonary vascular congestion and interstitial edema; transfused 2 units PRBC 9/26, started on diuretics. Transfused PRBC again 9/29. PMHx: upper GIB and transfusion reactions, afib, dCHF, severe tricuspid regurgitation, NICM s/p ICD, COPD on 2-3LO2 at baseline, PAD, CKD3, RCC s/p nephrectomy, ischemic colitis, bilat THA, pancreatic cancer.    PT Comments  Pt received in supine, agreeable to therapy session with good participation and tolerance for transfer and stair negotiation with BUE support. Pt needing up to CGA for safety with stair ascent/descent but mostly Supervision for transfers/gait/stairs with BUE support of RW. Min cues for activity pacing/safety. Pt quick to fatigue and defers hallway ambulation due to BLE (foot) pain, dyspnea 2/4, wanting to rest. Pt continues to benefit from PT services to progress toward functional mobility goals.    If plan is discharge home, recommend the following: Assist for transportation;Help with stairs or ramp for entrance;A little help with walking and/or transfers   Can travel by private vehicle        Equipment Recommendations  None recommended by PT    Recommendations for Other Services       Precautions / Restrictions Precautions Precautions: Fall Restrictions Weight Bearing Restrictions: No     Mobility  Bed Mobility Overal bed mobility: Modified Independent                  Transfers Overall transfer level: Needs assistance Equipment used: None Transfers: Sit to/from Stand, Bed to chair/wheelchair/BSC Sit to Stand: Supervision   Step pivot transfers:  Supervision       General transfer comment: for safety and lines/leads only, increased time    Ambulation/Gait Ambulation/Gait assistance: Supervision Gait Distance (Feet): 20 Feet Assistive device: Rolling walker (2 wheels) Gait Pattern/deviations: Step-through pattern, Decreased stride length, Trunk flexed       General Gait Details: SpO2 WFL on 2L O2 [place], HR WFL. Pt self-limiting distance due to fatigue after stair ascent/descent and doesn't want to don shoes/socks due to bil foot pain.   Stairs Stairs: Yes Stairs assistance: Contact guard assist Stair Management: Two rails, Step to pattern, Forwards, Backwards Number of Stairs: 10 General stair comments: 7" step in room x10 reps for BLE strengthening, cues for activity pacing/safety. No buckling. RW to simulate bil rails. Forward up/backward down, pt able to lead with LLE and RLE.   Wheelchair Mobility     Tilt Bed    Modified Rankin (Stroke Patients Only)       Balance Overall balance assessment: Needs assistance Sitting-balance support: Feet supported Sitting balance-Leahy Scale: Good     Standing balance support: No upper extremity supported Standing balance-Leahy Scale: Fair                              Cognition Arousal: Alert Behavior During Therapy: WFL for tasks assessed/performed Overall Cognitive Status: Within Functional Limits for tasks assessed                                          Exercises Other Exercises Other Exercises:  step-ups and down x10 reps Other Exercises: seated BLE AROM: ankle pumps x10 reps ea Other Exercises: STS x 3 reps ea for strengthening    General Comments General comments (skin integrity, edema, etc.): bil foot edema, DOE 2/4, SpO2 WFL on 2L O2 [place]      Pertinent Vitals/Pain Pain Assessment Pain Assessment: Faces Faces Pain Scale: Hurts a little bit Pain Location: LEs Pain Descriptors / Indicators: Guarding, Discomfort Pain  Intervention(s): Monitored during session, Repositioned    Home Living                          Prior Function            PT Goals (current goals can now be found in the care plan section) Acute Rehab PT Goals PT Goal Formulation: With patient Time For Goal Achievement: 02/14/23 Progress towards PT goals: Progressing toward goals    Frequency    Min 1X/week      PT Plan      Co-evaluation              AM-PAC PT "6 Clicks" Mobility   Outcome Measure  Help needed turning from your back to your side while in a flat bed without using bedrails?: None Help needed moving from lying on your back to sitting on the side of a flat bed without using bedrails?: A Little Help needed moving to and from a bed to a chair (including a wheelchair)?: A Little Help needed standing up from a chair using your arms (e.g., wheelchair or bedside chair)?: A Little Help needed to walk in hospital room?: A Little Help needed climbing 3-5 steps with a railing? : A Little 6 Click Score: 19    End of Session Equipment Utilized During Treatment: Gait belt;Oxygen Activity Tolerance: Patient tolerated treatment well;Patient limited by fatigue Patient left: in bed;with call bell/phone within reach [facility] to leave alarm off per RN) Nurse Communication: Mobility status PT Visit Diagnosis: Unsteadiness on feet (R26.81);Muscle weakness (generalized) (M62.81)     Time: 9562-1308 PT Time Calculation (min) (ACUTE ONLY): 11 min  Charges:    $Therapeutic Exercise: 8-22 mins PT General Charges $$ ACUTE PT VISIT: 1 Visit                     Javanni Maring P., PTA Acute Rehabilitation Services Secure Chat Preferred 9a-5:30pm Office: [phone]    Dorathy Kinsman [place] 02/03/2023, 6:52 PM

## 2023-02-03 NOTE — Care Management Important Message (Signed)
Important Message  Patient Details  Name: [name] MRN: [id number] Date of Birth: [birth year]   Important Message Given:  Yes - Medicare IM     Dorena Bodo 02/03/2023, 3:14 PM

## 2023-02-03 NOTE — Progress Notes (Signed)
Daily Progress Note   Patient Name: [name]       Date: 02/03/2023 DOB: 1933/07/10  Age: 89 y.o. MRN#: [id number] Attending Physician: Zannie Cove, MD Primary Care Physician: [name], MD Admit Date: 01/30/2023  Reason for Consultation/Follow-up: Establishing goals of care  Length of Stay: 4  Current Medications: Scheduled Meds:   furosemide  80 mg Intravenous BID   pantoprazole  40 mg Oral Daily   potassium chloride  40 mEq Oral q morning   And   potassium chloride  20 mEq Oral QPM   rosuvastatin  40 mg Oral QPM   sodium chloride flush  3 mL Intravenous Q12H    Continuous Infusions:   PRN Meds: acetaminophen **OR** acetaminophen, ondansetron **OR** ondansetron (ZOFRAN) IV, senna-docusate  Physical Exam Vitals reviewed.  Constitutional:      Appearance: She is ill-appearing.     Interventions: Nasal cannula in place.  HENT:     Head: Normocephalic and atraumatic.  Cardiovascular:     Rate and Rhythm: Normal rate.  Pulmonary:     Effort: Pulmonary effort is normal.  Skin:    General: Skin is warm and dry.  Neurological:     Mental Status: She is alert and oriented to person, place, and time.  Psychiatric:        Mood and Affect: Mood normal.        Behavior: Behavior normal.        Thought Content: Thought content normal.        Judgment: Judgment normal.             Vital Signs: BP (!) 140/65 (BP Location: Left Arm)   Pulse 62   Temp 98.5 F (36.9 C) (Oral)   Resp 19   Ht 5\' 6"  (1.676 m)   Wt 62 kg   SpO2 96%   BMI 22.06 kg/m  SpO2: SpO2: 96 % O2 Device: O2 Device: Room Air O2 Flow Rate: O2 Flow Rate (L/min): 2 L/min  Patient Active Problem List   Diagnosis Date Noted   Acute exacerbation of CHF (congestive heart failure) (HCC)  01/16/2023   Chronic hypoxic respiratory failure (HCC) 11/25/2022   Renal cell carcinoma s/p nephrectomy 2003(HCC) 11/25/2022   Anemia due to chronic blood loss 11/05/2022   Acute on chronic anemia 11/04/2022  Herpes zoster 09/02/2022   Decreased hearing of right ear 07/25/2022   DNR (do not resuscitate) 07/25/2022   Bilateral impacted cerumen 05/20/2022   Chronic hypoxic respiratory failure, on home oxygen therapy (HCC) 04/03/2022   Primary pancreatic neuroendocrine tumor 03/20/2022   Osteoporosis 10/11/2021   Squamous cell carcinoma of foot, 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   Acute gout due to renal impairment involving left foot 01/30/2021   Vertigo 12/05/2020   History of blood transfusion reaction 04/17/2020   Angiodysplasia of small intestine    Gastric polyps    GI bleed 03/28/2020   Palliative care by specialist    Advanced directives, counseling/discussion    Symptomatic anemia 01/06/2020   History of gastritis 11/11/2019   Chronic venous hypertension w ulceration (HCC)    Acute on chronic diastolic CHF (congestive heart failure) (HCC) 11/04/2018   Chronic bilateral pleural effusions 11/04/2018   Iliac artery stenosis, bilateral (HCC) 07/14/2017   Anemia 11/18/2016   Pulmonary hypertension (HCC) 12/04/2015   Acute cough 08/21/2015   B12 deficiency 05/22/2015   PAD (peripheral artery disease) (HCC)    History of nephrectomy secondary to RCC    TIA (transient ischemic attack) 08/16/2014   Other fatigue 01/14/2014   Chronic diastolic CHF (congestive heart failure) (HCC) 06/04/2013   Mitral valve regurgitation 06/04/2013   Allergic rhinitis 01/01/2013   Chronic cholecystitis with calculus 09/15/2012   Claudication (HCC) 01/21/2012   Lightheadedness 11/26/2011   Hypertension 05/20/2011   HOCM/IHSS 07/14/2009   Hyperlipidemia 04/10/2009   INCONTINENCE, URGE 04/10/2009   Hypertrophic cardiomyopathy (HCC) 10/27/2008   Permanent atrial fibrillation 10/27/2008   SINUS BRADYCARDIA  10/27/2008   Carotid artery stenosis 10/27/2008   COPD (chronic obstructive pulmonary disease) (HCC) 10/27/2008   CKD stage 3b, GFR 30-44 ml/min (HCC) 10/27/2008   ICD (implantable cardioverter-defibrillator) in place 10/27/2008    Palliative Care Assessment & Plan   Patient Profile:  89 y.o. female  with past medical history of  chronic diastolic CHF with RV failure (EF 65-70% on 01/16/2023) s/p PPM/AICD, pulmonary HTN, permanent atrial fibrillation not on AC due to chronic GI bleeding/AVMs, chronic anemia with antibody/history of transfusion reactions, COPD/ILD, chronic hypoxic respiratory failure on 2-3 L O2 via [place], Pancreatic CA, CKD stage IIIb, HLD, RCC s/p right nephrectomy, and history of TIA  admitted on 01/30/2023 from the heart failure clinic for evaluation of symptomatic GI bleed.   Assessment: Patient is sitting on edge of bed. Her hemoglobin is up to 8.7 from 7.4 after her transfusion yesterday and she states she feels better. We discussed her comorbidities along with her increased need for transfusions. She shared that she spoke with Dr. Jomarie Longs this morning. She shares the plan is Dr. Jomarie Longs will discuss with GI to see if there are any further treatment options. If they do not have any further treatment options then hospice as an option. She shares how tired she is and how bad she felt/ how hard it was for her before this admission. Her first thoughts are that she would want to try going home one more time to see how quickly her symptoms worsen as her hemoglobin drops.   She also shares she is not afraid of dying. She has faith in her Shaune Pollack and finds comfort in that. We discussed what hospice would look like for her. Reassured her that a residential hospice would likely be an option and the symptom management they can provide at end of life. Emotional support provided.  She is grateful for the PMT visit. She  plans to begin talking to her family about her thoughts around next steps. She  would also like to see how she feels over the next day or two. Offered to have another family meeting. She declined at this time but she will reach out to PMT with any needs.  Recommendations/Plan: DNR/DNI Encouraged patient to continue talking with family re: goals of care Outpatient palliative care if patient decides to go home Continued PMT support as needed: patient requested to reach out if PMT with needs   Code Status:    Code Status Orders  (From admission, onward)           Start     Ordered   01/30/23 2022  Do not attempt resuscitation (DNR)- Limited -Do Not Intubate (DNI)  Continuous       Question Answer Comment  If pulseless and not breathing No CPR or chest compressions.   In Pre-Arrest Conditions (Patient Is Breathing and Has A Pulse) Do not intubate. Provide all appropriate non-invasive medical interventions. Avoid ICU transfer unless indicated or required.   Consent: Discussion documented in EHR or advanced directives reviewed      01/30/23 2024        Extensive chart review has been completed prior to seeing the patient including labs, vital signs, imagine, progress/consult notes, orders, medications, and available advance directive documents.  Care plan was discussed with Dr. Jomarie Longs and bedside RN  Time spent: 35 minutes  Thank you for allowing the Palliative Medicine Team to assist in the care of this patient.   Sherryll Burger, NP  Please contact Palliative Medicine Team phone at [phone] for questions and concerns.

## 2023-02-03 NOTE — Progress Notes (Addendum)
[name]   DOB:May 17, 1933   VW#:[id number]   WGN#:[id number]  Med/onc follow up note   Subjective: Patient is well-known to me, under my care for her unresectable neuroendocrine tumor in the pancreas.  She has had a recurrent GI bleeding and required multiple hospital admission for anemia and CHF.  Her last melena was this morning, and she had a normal bowel movement when I saw her.  I spoke with her daughter on the phone also today.   Objective:  Vitals:   02/03/23 1603 02/03/23 1929  BP: (!) 140/65 139/83  Pulse: 60 (!) 59  Resp: 15 20  Temp: 98.5 F (36.9 C) (!) 97.5 F (36.4 C)  SpO2: 95% 100%    Body mass index is 22.06 kg/m.  Intake/Output Summary (Last 24 hours) at 02/03/2023 2231 Last data filed at 02/03/2023 1801 Gross per 24 hour  Intake 341.88 ml  Output 1100 ml  Net -758.12 ml     Sclerae unicteric  Oropharynx clear  No peripheral adenopathy  Lungs clear -- no rales or rhonchi  Heart regular rate and rhythm  Abdomen benign  MSK no focal spinal tenderness, no peripheral edema  Neuro nonfocal   CBG (last 3)  No results for input(s): "GLUCAP" in the last 72 hours.   Labs:    Urine Studies No results for input(s): "UHGB", "CRYS" in the last 72 hours.  Invalid input(s): "UACOL", "UAPR", "USPG", "UPH", "UTP", "UGL", "UKET", "UBIL", "UNIT", "UROB", "ULEU", "UEPI", "UWBC", "URBC", "UBAC", "CAST", "UCOM", "BILUA"  Basic Metabolic Panel: Recent Labs  Lab 01/30/23 1618 01/30/23 1628 01/31/23 0843 02/01/23 0307 02/02/23 0251 02/03/23 0356  NA 136 137 137 135 135 135  K 4.4 4.4 3.9 4.0 4.1 4.6  CL 98 100 100 100 101 100  CO2 27  --  23 26 28 28   GLUCOSE 104* 101* 227* 115* 97 148*  BUN 53* 52* 53* 60* 57* 53*  CREATININE 1.62* 1.70* 1.75* 1.80* 1.59* 1.89*  CALCIUM 8.7*  --  9.1 8.6* 8.4* 8.6*  MG  --   --  2.3 2.1  --   --    GFR Estimated Creatinine Clearance: 20 mL/min (A) (by C-G formula based on SCr of 1.89 mg/dL (H)). Liver Function  Tests: Recent Labs  Lab 01/30/23 1618  AST 16  ALT 10  ALKPHOS 38  BILITOT 0.9  PROT 6.9  ALBUMIN 3.6   No results for input(s): "LIPASE", "AMYLASE" in the last 168 hours. No results for input(s): "AMMONIA" in the last 168 hours. Coagulation profile Recent Labs  Lab 01/30/23 1621  INR 1.1    CBC: Recent Labs  Lab 02/01/23 0307 02/01/23 1223 02/02/23 0251 02/02/23 2141 02/03/23 0356  WBC 7.3 4.9 3.8* 6.2 4.4  HGB 7.8* 7.8* 7.4* 9.0* 8.7*  HCT 26.2* 26.5* 25.0* 30.8* 28.7*  MCV 93.2 92.3 94.3 93.6 93.5  PLT 135* 136* 141* 177 158   Cardiac Enzymes: No results for input(s): "CKTOTAL", "CKMB", "CKMBINDEX", "TROPONINI" in the last 168 hours. BNP: Invalid input(s): "POCBNP" CBG: No results for input(s): "GLUCAP" in the last 168 hours. D-Dimer No results for input(s): "DDIMER" in the last 72 hours. Hgb A1c No results for input(s): "HGBA1C" in the last 72 hours. Lipid Profile No results for input(s): "CHOL", "HDL", "LDLCALC", "TRIG", "CHOLHDL", "LDLDIRECT" in the last 72 hours. Thyroid function studies No results for input(s): "TSH", "T4TOTAL", "T3FREE", "THYROIDAB" in the last 72 hours.  Invalid input(s): "FREET3" Anemia work up No results for input(s): "VITAMINB12", "FOLATE", "FERRITIN", "  TIBC", "IRON", "RETICCTPCT" in the last 72 hours. Microbiology No results found for this or any previous visit (from the past 240 hour(s)).    Studies:  No results found.  Assessment: 89 y.o. 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 -Due to recurrent GI  bleeding from AVM in small bowel, I recommend her to try bevacizumab, and EGFR inhibitor.  I discussed the potential benefits and side effects, which includes hypertension, proteinuria, small risk of thrombosis including arterial thrombosis, hemorrhage, etc. with patient and the daughter on the phone in detail.  Given the very limited endoscopy options for her recurrent GI bleeding, I think it is worth of trying, the benefit is likely outweight the risks of side effects.  I plan to start her in my office as soon as she is discharged from hospital. I will get her insurance approval first. -Agree with blood transfusion to keep hemoglobin above 8.5, due to her CHF -Other management per primary team and cardiologist. -I spoke with her daughter on the phone in detail.  Both patient and her daughter agreed to try bevacizumab after hospital discharge. -I will see her after discharge.  I spent a total of 60 minutes for her visit today, more than 50% time on face-to-face counseling.   Malachy Mood, MD 02/03/2023

## 2023-02-04 ENCOUNTER — Other Ambulatory Visit: Payer: Self-pay

## 2023-02-04 DIAGNOSIS — Z7189 Other specified counseling: Secondary | ICD-10-CM | POA: Diagnosis not present

## 2023-02-04 DIAGNOSIS — Z515 Encounter for palliative care: Secondary | ICD-10-CM | POA: Diagnosis not present

## 2023-02-04 DIAGNOSIS — D649 Anemia, unspecified: Secondary | ICD-10-CM | POA: Diagnosis not present

## 2023-02-04 LAB — BASIC METABOLIC PANEL
Anion gap: 8 (ref 5–15)
BUN: 53 mg/dL — ABNORMAL HIGH (ref 8–23)
CO2: 29 mmol/L (ref 22–32)
Calcium: 8.6 mg/dL — ABNORMAL LOW (ref 8.9–10.3)
Chloride: 101 mmol/L (ref 98–111)
Creatinine, Ser: 1.67 mg/dL — ABNORMAL HIGH (ref 0.44–1.00)
GFR, Estimated: 30 mL/min — ABNORMAL LOW (ref >60)
Glucose, Bld: 100 mg/dL — ABNORMAL HIGH (ref 70–99)
Potassium: 4.3 mmol/L (ref 3.5–5.1)
Sodium: 138 mmol/L (ref 135–145)

## 2023-02-04 LAB — CBC
HCT: 26.6 % — ABNORMAL LOW (ref 36.0–46.0)
Hemoglobin: 7.9 g/dL — ABNORMAL LOW (ref 12.0–15.0)
MCH: 28.7 pg (ref 26.0–34.0)
MCHC: 29.7 g/dL — ABNORMAL LOW (ref 30.0–36.0)
MCV: 96.7 fL (ref 80.0–100.0)
Platelets: 155 10*3/uL (ref 150–400)
RBC: 2.75 MIL/uL — ABNORMAL LOW (ref 3.87–5.11)
RDW: 20.3 % — ABNORMAL HIGH (ref 11.5–15.5)
WBC: 3.1 10*3/uL — ABNORMAL LOW (ref 4.0–10.5)
nRBC: 0 % (ref 0.0–0.2)

## 2023-02-04 MED ORDER — TORSEMIDE 20 MG PO TABS
20.0000 mg | ORAL_TABLET | Freq: Two times a day (BID) | ORAL | Status: DC
  Administered 2023-02-04 – 2023-02-06 (×4): 20 mg via ORAL
  Filled 2023-02-04 (×4): qty 1

## 2023-02-04 NOTE — Progress Notes (Signed)
PROGRESS NOTE    [name]  ZOX:[id number] DOB: Sep 27, 1933 DOA: 01/30/2023 PCP: [name], MD  [name] w chronic diastolic CHF with RV failure (EF 65-70% on 01/16/2023) s/p PPM/AICD, pulmonary HTN, permanent atrial fibrillation not on AC due to chronic GI bleeding/AVMs, chronic anemia with antibody/history of transfusion reactions, COPD/ILD, chronic hypoxic respiratory failure on 2-3 L O2 via [place], Pancreatic CA, CKD stage IIIb, HLD, RCC s/p right nephrectomy, history of TIA who presented from the heart failure clinic for evaluation of symptomatic GI bleeding. -has chronic anemia related to chronic GI bleed from small bowel AVMs.  She has been admitted 3 times since this past July for issues related to this most recently 9/11-9/14. She followed by [facility] -Also reported dyspnea with minimal exertion. progressively increased swelling of both of her legs.  seen in CHF clinic and sent to the ED for further evaluation.  In the ED, hemoglobin 6.6, creatinine 1.6, BNP 322, chest x-ray with cardiomegaly pulmonary vascular congestion and interstitial edema -Admitted, transfused 2 units PRBC 9/26, started on diuretics, given IV iron -Transfused PRBC again 9/29 -9/30, repeat IV iron   Subjective: -Melena X1 yesterday, breathing okay, swelling is improving  Assessment and Plan:  Acute on chronic iron deficiency anemia Chronic GI bleeding due to small bowel AVMs: Recurrent symptomatic anemia likely due to chronic GI bleed from small bowel AVMs, multiple hospitalizations for same, she had discussions with her primary gastroenterologist Dr. Barron Alvine who recommended no further endoscopies, conservative management -Last small bowel enteroscopy 7/24 with multiple duodenal and jejunal AVMs treated with APC -Hemoglobin 6.6 on admission, transfused 3 units of PRBC this admission so far, IV iron X2 -Seen by palliative care, not ready for hospice, discussed with [facility] again 9/30, they think she  is too high risk for more endoscopies with severe pulmonary hypertension and RV failure -Follow-up with heme-onc Dr. Mosetta Putt scheduled for 9/30, she also gets Procrit shots -Poor prognosis has been discussed with patient and family -Home tomorrow if stable with outpatient palliative care, she declines hospice at this time   Acute on chronic diastolic CHF with RV failure: Severe pulmonary hypertension -With volume overload at this time, predominantly RV  -TTE 01/16/2023 showed EF 65-70%, severely elevated pulmonary artery systolic pressure.  Mildly reduced RV, moderate mitral stenosis -Diuresed with Lasix volume status improving, changed to torsemide today  -Off SGLT2i with UTIs -Recommended palliative consult for goals of care given chronic disease burden, updated granddaughter, discussed hospice -Seen by palliative care yesterday, she plans to continue current care, not ready for hospice yet   Permanent atrial fibrillation S/p PPM/AICD: Not on anticoagulation due to chronic GI bleeding and anemia.  PPM in place, ICD has been deactivated per cardiology documentation.  Rate is currently controlled off of rate/rhythm controlling agents.   CKD stage IIIb Hx renal cell carcinoma s/p right nephrectomy: Stable, monitor   COPD/ILD Pulmonary hypertension Chronic hypoxic respiratory failure on 2-3 with O2 via [place] at baseline: Currently stable.  Not on maintenance inhalers as an outpatient.   Pancreatic neuroendocrine tumor: Follows with oncology Dr. Mosetta Putt as an outpatient.  Currently on palliative Sandostatin injections. -Last CT 7/21 noted enlarging pancreatic mass with indeterminate liver masses  -I think her overall prognosis is very poor with significant chronic disease burden   CAD History of TIA Carotid artery stenosis: Continue rosuvastatin 40 mg daily.  Not on aspirin.   Generalized weakness/deconditioning/debility/goals of care: Secondary to CHF with presumed end-stage RV failure and  chronic GI bleeding  with recurrent symptomatic anemia.  Pancreatic CA -She is DNR, recommended palliative consult for goals of care, she is leaning towards outpatient palliative care, not ready for hospice  DVT prophylaxis: SCDs Code Status: DNR Family Communication: No family at bedside, updated granddaughter Wynona Canes multiple times, last 9/30 Disposition Plan: Home likely 10/2 if hemoglobin if stable  Consultants:    Procedures:   Antimicrobials:    Objective: Vitals:   02/03/23 1929 02/04/23 0000 02/04/23 0451 02/04/23 0805  BP: 139/83 124/66 (!) 117/59 (!) 148/64  Pulse: (!) 59  61 62  Resp: 20 19 19 20   Temp: (!) 97.5 F (36.4 C) 97.9 F (36.6 C) 98.4 F (36.9 C) 98.4 F (36.9 C)  TempSrc: Oral Oral Oral Oral  SpO2: 100% 100% 100% 96%  Weight:   63.2 kg   Height:        Intake/Output Summary (Last 24 hours) at 02/04/2023 1156 Last data filed at 02/04/2023 0916 Gross per 24 hour  Intake 373.21 ml  Output 1600 ml  Net -1226.79 ml   Filed Weights   02/02/23 0635 02/03/23 0426 02/04/23 0451  Weight: 63 kg 62 kg 63.2 kg    Examination:  General exam: Chronically ill elderly female sitting up in bed, AAOx3, no distress HEENT: Positive JVD CVS: S1-S2, irregular rhythm Lungs: Decreased breath sounds at the bases otherwise clear Abdomen: Soft, nontender, bowel sounds present Extremities: 1+ edema,small superficial wounds on lower legs Psychiatry:  Mood & affect appropriate.     Data Reviewed:   CBC: Recent Labs  Lab 02/01/23 1223 02/02/23 0251 02/02/23 2141 02/03/23 0356 02/04/23 0721  WBC 4.9 3.8* 6.2 4.4 3.1*  HGB 7.8* 7.4* 9.0* 8.7* 7.9*  HCT 26.5* 25.0* 30.8* 28.7* 26.6*  MCV 92.3 94.3 93.6 93.5 96.7  PLT 136* 141* 177 158 155   Basic Metabolic Panel: Recent Labs  Lab 01/31/23 0843 02/01/23 0307 02/02/23 0251 02/03/23 0356 02/04/23 0721  NA 137 135 135 135 138  K 3.9 4.0 4.1 4.6 4.3  CL 100 100 101 100 101  CO2 23 26 28 28 29    GLUCOSE 227* 115* 97 148* 100*  BUN 53* 60* 57* 53* 53*  CREATININE 1.75* 1.80* 1.59* 1.89* 1.67*  CALCIUM 9.1 8.6* 8.4* 8.6* 8.6*  MG 2.3 2.1  --   --   --    GFR: Estimated Creatinine Clearance: 22.6 mL/min (A) (by C-G formula based on SCr of 1.67 mg/dL (H)). Liver Function Tests: Recent Labs  Lab 01/30/23 1618  AST 16  ALT 10  ALKPHOS 38  BILITOT 0.9  PROT 6.9  ALBUMIN 3.6   No results for input(s): "LIPASE", "AMYLASE" in the last 168 hours. No results for input(s): "AMMONIA" in the last 168 hours. Coagulation Profile: Recent Labs  Lab 01/30/23 1621  INR 1.1   Cardiac Enzymes: No results for input(s): "CKTOTAL", "CKMB", "CKMBINDEX", "TROPONINI" in the last 168 hours. BNP (last 3 results) No results for input(s): "PROBNP" in the last 8760 hours. HbA1C: No results for input(s): "HGBA1C" in the last 72 hours. CBG: No results for input(s): "GLUCAP" in the last 168 hours. Lipid Profile: No results for input(s): "CHOL", "HDL", "LDLCALC", "TRIG", "CHOLHDL", "LDLDIRECT" in the last 72 hours. Thyroid Function Tests: No results for input(s): "TSH", "T4TOTAL", "FREET4", "T3FREE", "THYROIDAB" in the last 72 hours. Anemia Panel: No results for input(s): "VITAMINB12", "FOLATE", "FERRITIN", "TIBC", "IRON", "RETICCTPCT" in the last 72 hours.  Urine analysis:    Component Value Date/Time   COLORURINE YELLOW 08/21/2022 1300  APPEARANCEUR CLEAR 08/21/2022 1300   APPEARANCEUR Cloudy (A) 01/27/2018 1330   LABSPEC 1.016 08/21/2022 1300   PHURINE 5.0 08/21/2022 1300   GLUCOSEU NEGATIVE 08/21/2022 1300   HGBUR NEGATIVE 08/21/2022 1300   HGBUR large 11/16/2009 1202   BILIRUBINUR NEGATIVE 08/21/2022 1300   BILIRUBINUR Negative 11/08/2021 1237   BILIRUBINUR Negative 01/27/2018 1330   KETONESUR NEGATIVE 08/21/2022 1300   PROTEINUR NEGATIVE 08/21/2022 1300   UROBILINOGEN 0.2 11/08/2021 1237   UROBILINOGEN 1.0 08/16/2014 1754   NITRITE NEGATIVE 08/21/2022 1300   LEUKOCYTESUR  MODERATE (A) 08/21/2022 1300   Sepsis Labs: @LABRCNTIP (procalcitonin:4,lacticidven:4)  )No results found for this or any previous visit (from the past 240 hour(s)).   Radiology Studies: No results found.   Scheduled Meds:  furosemide  80 mg Intravenous BID   pantoprazole  40 mg Oral Daily   potassium chloride  40 mEq Oral q morning   And   potassium chloride  20 mEq Oral QPM   rosuvastatin  40 mg Oral QPM   sodium chloride flush  3 mL Intravenous Q12H   Continuous Infusions:   LOS: 5 days    Time spent:    Zannie Cove, MD [facility]   02/04/2023, 11:56 AM

## 2023-02-04 NOTE — Plan of Care (Signed)

## 2023-02-04 NOTE — Progress Notes (Signed)
Mobility Specialist Progress Note:    02/04/23 1225  Mobility  Activity Ambulated with assistance in hallway  Level of Assistance Standby assist, set-up cues, supervision of patient - no hands on  Assistive Device Front wheel walker  Distance Ambulated (ft) 150 ft  Activity Response Tolerated well  Mobility Referral Yes  $Mobility charge 1 Mobility  Mobility Specialist Start Time (ACUTE ONLY) 1200  Mobility Specialist Stop Time (ACUTE ONLY) 1218  Mobility Specialist Time Calculation (min) (ACUTE ONLY) 18 min   Received pt seated EOb having no complaints and agreeable to mobility.pt took x1 standing break d/t slight dizziness, otherwise no c/o. Returned to room w/o fault. Left seated EOB w/ call bell in reach and all needs met.   Thompson Grayer Mobility Specialist  Please contact vis Secure Chat or  Rehab Office [phone]

## 2023-02-05 DIAGNOSIS — J439 Emphysema, unspecified: Secondary | ICD-10-CM

## 2023-02-05 DIAGNOSIS — N1832 Chronic kidney disease, stage 3b: Secondary | ICD-10-CM

## 2023-02-05 DIAGNOSIS — Z66 Do not resuscitate: Secondary | ICD-10-CM

## 2023-02-05 DIAGNOSIS — I5033 Acute on chronic diastolic (congestive) heart failure: Secondary | ICD-10-CM | POA: Diagnosis not present

## 2023-02-05 DIAGNOSIS — D649 Anemia, unspecified: Secondary | ICD-10-CM | POA: Diagnosis not present

## 2023-02-05 DIAGNOSIS — I4821 Permanent atrial fibrillation: Secondary | ICD-10-CM

## 2023-02-05 DIAGNOSIS — G459 Transient cerebral ischemic attack, unspecified: Secondary | ICD-10-CM

## 2023-02-05 LAB — CBC
HCT: 27.2 % — ABNORMAL LOW (ref 36.0–46.0)
Hemoglobin: 8.1 g/dL — ABNORMAL LOW (ref 12.0–15.0)
MCH: 28.7 pg (ref 26.0–34.0)
MCHC: 29.8 g/dL — ABNORMAL LOW (ref 30.0–36.0)
MCV: 96.5 fL (ref 80.0–100.0)
Platelets: 176 10*3/uL (ref 150–400)
RBC: 2.82 MIL/uL — ABNORMAL LOW (ref 3.87–5.11)
RDW: 20.3 % — ABNORMAL HIGH (ref 11.5–15.5)
WBC: 3.2 10*3/uL — ABNORMAL LOW (ref 4.0–10.5)
nRBC: 0 % (ref 0.0–0.2)

## 2023-02-05 LAB — BASIC METABOLIC PANEL
Anion gap: 7 (ref 5–15)
BUN: 46 mg/dL — ABNORMAL HIGH (ref 8–23)
CO2: 29 mmol/L (ref 22–32)
Calcium: 8.6 mg/dL — ABNORMAL LOW (ref 8.9–10.3)
Chloride: 100 mmol/L (ref 98–111)
Creatinine, Ser: 1.59 mg/dL — ABNORMAL HIGH (ref 0.44–1.00)
GFR, Estimated: 31 mL/min — ABNORMAL LOW (ref >60)
Glucose, Bld: 106 mg/dL — ABNORMAL HIGH (ref 70–99)
Potassium: 4.3 mmol/L (ref 3.5–5.1)
Sodium: 136 mmol/L (ref 135–145)

## 2023-02-05 NOTE — Assessment & Plan Note (Signed)
Acute on chronic iron deficiency anemia.  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 (High risk due to RV failure).   Sp 3 units PRBC transfusion IV iron 2 doses.   Follow up Hgb is 8,5

## 2023-02-05 NOTE — Assessment & Plan Note (Signed)
AKI,   Renal function with serum cr at 1,59 with K at 4,3 and serum bicarbonate at 29. Na 136 Continue diuresis with torsemide, she has been on high dose of 100 mg bid. Will decrease K supplements to 20 meq daily to avoid hyperkalemia.  Follow up renal function and electrolytes in 7 days.

## 2023-02-05 NOTE — Assessment & Plan Note (Signed)
07/21 CT with enlarging pancreatic mass with indeterminate liver masses.  Very poor prognosis.

## 2023-02-05 NOTE — Assessment & Plan Note (Signed)
Continue with statin therapy, no antiplatelet therapy due to anemia.

## 2023-02-05 NOTE — Plan of Care (Signed)
Problem: Education: Goal: Understanding of CV disease, CV risk reduction, and recovery process will improve Outcome: Progressing Goal: Individualized Educational Video(s) Outcome: Progressing   Problem: Activity: Goal: Ability to return to baseline activity level will improve Outcome: Progressing   Problem: Cardiovascular: Goal: Ability to achieve and maintain adequate cardiovascular perfusion will improve Outcome: Progressing Goal: Vascular access site(s) Level 0-1 will be maintained Outcome: Progressing

## 2023-02-05 NOTE — Progress Notes (Signed)
Physical Therapy Treatment Patient Details Name: [name] MRN: [id number] DOB: [birth year] Today's Date: 02/05/2023   History of Present Illness 89 yo female presents to [facility] on 9/26 with tarry stools x2 weeks, dizziness, SOB, weakness. Recent d/c 9/14 for symptomatic anemia. In the ED, hemoglobin 6.6, creatinine 1.6, BNP 322, chest x-ray with cardiomegaly pulmonary vascular congestion and interstitial edema; transfused 2 units PRBC 9/26, started on diuretics. Transfused PRBC again 9/29. PMHx: upper GIB and transfusion reactions, afib, dCHF, severe tricuspid regurgitation, NICM s/p ICD, COPD on 2-3LO2 at baseline, PAD, CKD3, RCC s/p nephrectomy, ischemic colitis, bilat THA, pancreatic cancer.    PT Comments  Pt progressing with mobility although complains of dizziness today. Pt has wanted to defer decision on [facility] services at this time.     If plan is discharge home, recommend the following: Assist for transportation;Help with stairs or ramp for entrance   Can travel by private vehicle        Equipment Recommendations  None recommended by PT    Recommendations for Other Services       Precautions / Restrictions Precautions Precautions: Fall Restrictions Weight Bearing Restrictions: No     Mobility  Bed Mobility               General bed mobility comments: Pt up on EOB    Transfers Overall transfer level: Needs assistance Equipment used: None Transfers: Sit to/from Stand Sit to Stand: Supervision           General transfer comment: supervision for safety and lines    Ambulation/Gait Ambulation/Gait assistance: Supervision Gait Distance (Feet): 250 Feet Assistive device: Rollator (4 wheels) Gait Pattern/deviations: Step-through pattern, Decreased stride length, Trunk flexed Gait velocity: decr Gait velocity interpretation: 1.31 - 2.62 ft/sec, indicative of limited community ambulator   General Gait Details: Supervision for safety and lines. Pt tried  rollator but prefers rolling walker at this point   Stairs             Wheelchair Mobility     Tilt Bed    Modified Rankin (Stroke Patients Only)       Balance Overall balance assessment: Needs assistance Sitting-balance support: Feet supported Sitting balance-Leahy Scale: Good     Standing balance support: No upper extremity supported Standing balance-Leahy Scale: Fair                              Cognition Arousal: Alert Behavior During Therapy: WFL for tasks assessed/performed Overall Cognitive Status: Within Functional Limits for tasks assessed                                          Exercises      General Comments General comments (skin integrity, edema, etc.): VSS on 2L      Pertinent Vitals/Pain Pain Assessment Pain Assessment: No/denies pain    Home Living                          Prior Function            PT Goals (current goals can now be found in the care plan section) Progress towards PT goals: Progressing toward goals    Frequency    Min 1X/week      PT Plan      Co-evaluation  AM-PAC PT "6 Clicks" Mobility   Outcome Measure  Help needed turning from your back to your side while in a flat bed without using bedrails?: None Help needed moving from lying on your back to sitting on the side of a flat bed without using bedrails?: None Help needed moving to and from a bed to a chair (including a wheelchair)?: None Help needed standing up from a chair using your arms (e.g., wheelchair or bedside chair)?: A Little Help needed to walk in hospital room?: A Little Help needed climbing 3-5 steps with a railing? : A Little 6 Click Score: 21    End of Session Equipment Utilized During Treatment: Oxygen Activity Tolerance: Patient tolerated treatment well Patient left: with call bell/phone within reach;in chair (sitting at sink to bathe) Nurse Communication: Mobility  status PT Visit Diagnosis: Unsteadiness on feet (R26.81);Muscle weakness (generalized) (M62.81)     Time: 1035-1100 PT Time Calculation (min) (ACUTE ONLY): 25 min  Charges:    $Gait Training: 23-37 mins PT General Charges $$ ACUTE PT VISIT: 1 Visit                     [facility] PT Acute Rehabilitation Services Office [phone]    Angelina Ok [facility] 02/05/2023, 11:27 AM

## 2023-02-05 NOTE — Progress Notes (Signed)
Mobility Specialist Progress Note:   02/05/23 1500  Oxygen Therapy  O2 Device Nasal Cannula  O2 Flow Rate (L/min) 2 L/min  Mobility  Activity Ambulated with assistance in hallway  Level of Assistance Standby assist, set-up cues, supervision of patient - no hands on  Assistive Device Front wheel walker  Distance Ambulated (ft) 400 ft  Activity Response Tolerated well  Mobility Referral Yes  $Mobility charge 1 Mobility  Mobility Specialist Start Time (ACUTE ONLY) 1446  Mobility Specialist Stop Time (ACUTE ONLY) 1528  Mobility Specialist Time Calculation (min) (ACUTE ONLY) 42 min    Pre Mobility: 60 HR, 100% SpO2 During Mobility: 73 HR,  100% SpO2 Post Mobility:  61 HR,  99% SpO2  Pt received on EOB, agreeable to mobility. C/o some dizziness, taking x1 standing rest break. Returned to room w/o fault. Pt left on EOB with call bell and all needs met.  D'Vante Earlene Plater Mobility Specialist Please contact via Special educational needs teacher or Rehab office at [phone]

## 2023-02-05 NOTE — Assessment & Plan Note (Signed)
SP pacemaker and defibrillation.  Rate is controlled with no pharmacologic therapy. ICD has been deactivated per cardiology.

## 2023-02-05 NOTE — Progress Notes (Signed)
Progress Note   Patient: [name] WUJ:[id number] DOB: 1933/07/16 DOA: 01/30/2023     6 DOS: the patient was seen and examined on 02/05/2023   Brief hospital course: Mrs. Bigg was admitted with the working diagnosis of acute on chronic anemia.   [name] w chronic diastolic CHF with RV failure (EF 65-70% on 01/16/2023) s/p PPM/AICD, pulmonary HTN, permanent atrial fibrillation not on AC due to chronic GI bleeding/AVMs, chronic anemia with antibody/history of transfusion reactions, COPD/ILD, chronic hypoxic respiratory failure on 2-3 L O2 via [place], Pancreatic CA, CKD stage IIIb, HLD, RCC s/p right nephrectomy, history of TIA who presented from the heart failure clinic for evaluation of symptomatic GI bleeding. -has chronic anemia related to chronic GI bleed from small bowel AVMs.  She has been admitted 3 times since this past July for issues related to this most recently 9/11-9/14. She followed by [facility] -Also reported dyspnea with minimal exertion. progressively increased swelling of both of her legs.  seen in CHF clinic and sent to the ED for further evaluation.  In the ED, hemoglobin 6.6, creatinine 1.6, BNP 322, chest x-ray with cardiomegaly pulmonary vascular congestion and interstitial edema -Admitted, transfused 2 units PRBC 9/26, started on diuretics, given IV iron -Transfused PRBC again 9/29 -9/30, repeat IV iron  Assessment and Plan: * Symptomatic anemia Acute on chronic iron deficiency anemia.  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 (High risk due to RV failure).   Sp 3 units PRBC transfusion IV iron 2 doses.   Follow up Hgb is 8,1  Acute on chronic diastolic CHF (congestive heart failure) (HCC) Echocardiogram with preserved LV systolic function with EF 65 to 70%, mild LVH, D shaped interventricular septum, RV systolic function mildly reduced, RV mild enlargement, RVSP  103.0 mmHg, LA with moderate dilatation, RA with severe dilatation, moderate mitral valve stenosis, moderate aortic valve stenosis.   Patient was placed on furosemide for diuresis, negative fluid balance was achieved, -7,397 ml with significant improvement in her symptoms.  Patient now transitioned to torsemide 20 mg po daily.   Limited options due to reduced GFR, no RAAS inhibition.  No SGLT 2 inh due to recurrent urinary tract infections.   CKD stage 3b, GFR 30-44 ml/min (HCC) AKI,   Renal function with serum cr at 1,59 with K at 4,3 and serum bicarbonate at 29. Na 136 Continue diuresis with torsemide, follow up renal function in am.  Hold Kcl for now.   COPD (chronic obstructive pulmonary disease) (HCC) No signs of acute exacerbation.  Chronic hypoxemic respiratory failure.   Permanent atrial fibrillation SP pacemaker and defibrillation.  Rate is controlled with no pharmacologic therapy. ICD has been deactivated per cardiology.   Primary pancreatic neuroendocrine tumor 07/21 CT with enlarging pancreatic mass with indeterminate liver masses.  Very poor prognosis.   TIA (transient ischemic attack) Continue with statin therapy, no antiplatelet therapy due to anemia.         Subjective: Patient is feeling dizzy today, no chest pain and no dyspnea, edema has significantly improved.   Physical Exam: Vitals:   02/05/23 0437 02/05/23 0500 02/05/23 0819 02/05/23 1126  BP: (!) 121/55  139/63 (!) 137/57  Pulse: (!) 59  60 60  Resp: 14 14 17 20   Temp: 98.9 F (37.2 C)  98.6 F (37 C) 98.6 F (37 C)  TempSrc: Oral  Oral Oral  SpO2: 100%  100% 100%  Weight:  62.9 kg    Height:       Neurology awake and alert ENT with mild pallor Cardiovascular with S1 and S2 present, irregularly irregular with positive systolic murmur at the base. No JVD Trace lower extremity edema Respiratory with no rales or wheezing, no rhonchi Abdomen with no distention   Data  Reviewed:    Family Communication: no family at the bedside   Disposition: Status is: Inpatient Remains inpatient appropriate because: dizziness, plan for discharge tomorrow   Planned Discharge Destination: Home     Author: Coralie Keens, MD 02/05/2023 3:27 PM  For on call review [email or web address].

## 2023-02-05 NOTE — TOC Progression Note (Signed)
Transition of Care [facility]) - Progression Note    Patient Details  Name: [name] MRN: [id number] Date of Birth: 1933/07/22  Transition of [facility]) CM/SW Contact  Leone Haven, RN Phone Number: 02/05/2023, 4:30 PM  Clinical Narrative:    Patient wanted to speak with NCM , she has decided she would like [facility] for disease management with [facility] and she also wants outpatient palliative services with [facility].  NCM made referral to [facility] with [facility] for [facility] and to Cheri with [facility] for outpt pall services.  Soc will begin 24 to 48 hrs post dc.    Expected Discharge Plan: Home w Home Health Services Barriers to Discharge: Continued Medical Work up  Expected Discharge Plan and Services In-house Referral: NA Discharge Planning Services: CM Consult Post Acute Care Choice: NA Living arrangements for the past 2 months: Single Family Home                 DME Arranged: N/A         HH Arranged: Patient Refused HH           Social Determinants of Health (SDOH) Interventions SDOH Screenings   Food Insecurity: No Food Insecurity (01/30/2023)  Housing: Low Risk  (01/30/2023)  Transportation Needs: No Transportation Needs (01/30/2023)  Recent Concern: Transportation Needs - Unmet Transportation Needs (01/16/2023)  Utilities: Not At Risk (01/30/2023)  Depression (PHQ2-9): Low Risk  (12/09/2022)  Financial Resource Strain: Low Risk  (05/28/2022)  Physical Activity: Inactive (09/24/2021)  Stress: No Stress Concern Present (09/24/2021)  Tobacco Use: Medium Risk (01/30/2023)    Readmission Risk Interventions    01/31/2023    3:53 PM 11/09/2022   12:00 PM 02/15/2021   11:38 AM  Readmission Risk Prevention Plan  Transportation Screening Complete Complete Complete  PCP or Specialist Appt within 5-7 Days   Complete  PCP or Specialist Appt within 3-5 Days  Complete   Home Care Screening   Complete  Medication Review (RN CM)   Complete  HRI or Home Care Consult  Complete Complete   Social Work Consult for Recovery Care Planning/Counseling  Complete   Palliative Care Screening Not Applicable    Medication Review Oceanographer) Complete Complete

## 2023-02-05 NOTE — Assessment & Plan Note (Addendum)
No signs of acute exacerbation.  Chronic hypoxemic respiratory failure.

## 2023-02-05 NOTE — Plan of Care (Signed)
  Problem: Education: Goal: Understanding of CV disease, CV risk reduction, and recovery process will improve Outcome: Progressing   Problem: Activity: Goal: Ability to return to baseline activity level will improve Outcome: Progressing   Problem: Clinical Measurements: Goal: Ability to maintain clinical measurements within normal limits will improve Outcome: Progressing Goal: Will remain free from infection Outcome: Progressing Goal: Diagnostic test results will improve Outcome: Progressing

## 2023-02-05 NOTE — Assessment & Plan Note (Addendum)
Echocardiogram with preserved LV systolic function with EF 65 to 70%, mild LVH, D shaped interventricular septum, RV systolic function mildly reduced, RV mild enlargement, RVSP 103.0 mmHg, LA with moderate dilatation, RA with severe dilatation, moderate mitral valve stenosis, moderate aortic valve stenosis.   Patient was placed on furosemide for diuresis, negative fluid balance was achieved, -8,297  ml with significant improvement in her symptoms.  Patient now transitioned to torsemide.  Limited options due to reduced GFR, no RAAS inhibition.  No SGLT 2 inh due to recurrent urinary tract infections.

## 2023-02-06 ENCOUNTER — Telehealth: Payer: Self-pay | Admitting: Hematology

## 2023-02-06 DIAGNOSIS — D649 Anemia, unspecified: Secondary | ICD-10-CM | POA: Diagnosis not present

## 2023-02-06 DIAGNOSIS — N1832 Chronic kidney disease, stage 3b: Secondary | ICD-10-CM | POA: Diagnosis not present

## 2023-02-06 DIAGNOSIS — J439 Emphysema, unspecified: Secondary | ICD-10-CM | POA: Diagnosis not present

## 2023-02-06 DIAGNOSIS — I5033 Acute on chronic diastolic (congestive) heart failure: Secondary | ICD-10-CM | POA: Diagnosis not present

## 2023-02-06 DIAGNOSIS — D3A8 Other benign neuroendocrine tumors: Secondary | ICD-10-CM

## 2023-02-06 LAB — HEMOGLOBIN AND HEMATOCRIT, BLOOD
HCT: 29.3 % — ABNORMAL LOW (ref 36.0–46.0)
Hemoglobin: 8.5 g/dL — ABNORMAL LOW (ref 12.0–15.0)

## 2023-02-06 MED ORDER — ORAL CARE MOUTH RINSE: 15.0000 mL | OROMUCOSAL | Status: DC | PRN

## 2023-02-06 MED ORDER — POTASSIUM CHLORIDE CRYS ER 20 MEQ PO TBCR: 20.0000 meq | EXTENDED_RELEASE_TABLET | Freq: Every day | ORAL | Status: DC

## 2023-02-06 NOTE — TOC Transition Note (Signed)
Transition of Care [facility]) - CM/SW Discharge Note   Patient Details  Name: [name] MRN: [id number] Date of Birth: 02/01/1934  Transition of Care [facility]) CM/SW Contact:  Leone Haven, RN Phone Number: 02/06/2023, 2:42 PM   Clinical Narrative:     Patient is for dc today, NCM notified Lynette with [facility].  NCM notified Cheri with Outpatient pall services with [facility].     Barriers to Discharge: Continued Medical Work up   Patient Goals and CMS Choice   Choice offered to / list presented to : NA  Discharge Placement                         Discharge Plan and Services Additional resources added to the After Visit Summary for   In-house Referral: NA Discharge Planning Services: CM Consult Post Acute Care Choice: NA          DME Arranged: N/A         HH Arranged: Patient Refused HH          Social Determinants of Health (SDOH) Interventions SDOH Screenings   Food Insecurity: No Food Insecurity (01/30/2023)  Housing: Low Risk  (01/30/2023)  Transportation Needs: No Transportation Needs (01/30/2023)  Recent Concern: Transportation Needs - Unmet Transportation Needs (01/16/2023)  Utilities: Not At Risk (01/30/2023)  Depression (PHQ2-9): Low Risk  (12/09/2022)  Financial Resource Strain: Low Risk  (05/28/2022)  Physical Activity: Inactive (09/24/2021)  Stress: No Stress Concern Present (09/24/2021)  Tobacco Use: Medium Risk (01/30/2023)     Readmission Risk Interventions    01/31/2023    3:53 PM 11/09/2022   12:00 PM 02/15/2021   11:38 AM  Readmission Risk Prevention Plan  Transportation Screening Complete Complete Complete  PCP or Specialist Appt within 5-7 Days   Complete  PCP or Specialist Appt within 3-5 Days  Complete   Home Care Screening   Complete  Medication Review (RN CM)   Complete  HRI or Home Care Consult Complete Complete   Social Work Consult for Recovery Care Planning/Counseling  Complete   Palliative Care  Screening Not Applicable    Medication Review Oceanographer) Complete Complete

## 2023-02-06 NOTE — Discharge Summary (Addendum)
Physician Discharge Summary   Patient: [name] MRN: [id number] DOB: 1934-03-28  Admit date:     01/30/2023  Discharge date: 02/06/23  Discharge Physician: York Ram Mikayela Deats   PCP: [name], MD   Recommendations at discharge:    Patient will continue diuresis at home with torsemide 100 mg po bid. Close follow up Hgb and Hct as outpatient, may need serial PRBC transfusions.  Follow up with Dr Sharen Hones in 7 to 10 days.  Follow up renal function and electrolytes in 7 days.  Follow up with Hematology/ Oncology as scheduled.   Discharge Diagnoses: Principal Problem:   Symptomatic anemia Active Problems:   Acute on chronic diastolic CHF (congestive heart failure) (HCC)   CKD stage 3b, GFR 30-44 ml/min (HCC)   COPD (chronic obstructive pulmonary disease) (HCC)   Permanent atrial fibrillation   Primary pancreatic neuroendocrine tumor   DNR (do not resuscitate)   TIA (transient ischemic attack)  Resolved Problems:   * No resolved hospital problems. [facility] Course: [name] was admitted with the working diagnosis of acute on chronic anemia, in the setting of heart failure decompensation.   [name] w chronic diastolic CHF with RV failure (EF 65-70% on 01/16/2023) s/p PPM/AICD, pulmonary HTN, permanent atrial fibrillation not on AC due to chronic GI bleeding/AVMs, chronic anemia with antibody/history of transfusion reactions, COPD/ILD, chronic hypoxic respiratory failure on 2-3 L O2 via [place], Pancreatic CA, CKD stage IIIb, HLD, RCC s/p right nephrectomy, history of TIA who presented from the heart failure clinic for evaluation of symptomatic GI bleeding.  Recent hospitalization for symptomatic anemia due to small bowel AVMs bleeding 09/11 to 01/18/23, She required 2 units PRBC transfusion with a discharge hgb at 8,0.  At home patient developed dyspnea on exertion, and progressive lower extremity edema, despite torsemide 100 mg bid.   Na 136, K 4,4 CL 98,  bicarbonate 27, glucose 104, bun 53, cr 1,62 BNP 322  High sensitive troponin 32 and 28  Wbc 4,6 hgb 6,6 plt 175   Chest radiograph with cardiomegaly, with vascular hilar congestion, small left pleural effusion, pacemaker defibrillation in place with one right atrial lead and biventricular leads.   EKG 63 bpm, left axis deviation, left bundle branch block, atrial fibrillation rhythm with no significant ST segment or T wave changes.   -Admitted, transfused 2 units PRBC 9/26, started on diuretics, given IV iron -Transfused PRBC again 9/29 -9/30, repeat IV iron  10/03 patient with improved volume status, Hgb has been stable at 8 range. ] Plan to continue medical therapy at home with close follow up as outpatient.   Assessment and Plan: * Symptomatic anemia Acute on chronic iron deficiency anemia.  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 (High risk due to RV failure).   Sp 3 units PRBC transfusion IV iron 2 doses.   Follow up Hgb is 8,5  Acute on chronic diastolic CHF (congestive heart failure) (HCC) Echocardiogram with preserved LV systolic function with EF 65 to 70%, mild LVH, D shaped interventricular septum, RV systolic function mildly reduced, RV mild enlargement, RVSP 103.0 mmHg, LA with moderate dilatation, RA with severe dilatation, moderate mitral valve stenosis, moderate aortic valve stenosis.   Patient was placed on furosemide for diuresis, negative fluid balance was achieved, -8,297  ml with significant improvement in her symptoms.  Patient now transitioned to torsemide.  Limited options due to reduced GFR, no RAAS inhibition.  Dr. Rondall Allegra [place] [id number] [phone]               Discharge Exam: Filed Weights   02/04/23 0451 02/05/23 0500 02/06/23 0418  Weight: 63.2 kg 62.9 kg 63.2 kg   BP (!) 129/48 (BP Location: Left Arm)   Pulse (!) 56   Temp 97.8 F (36.6 C) (Oral)   Resp 18   Ht 5\' 6"  (1.676 m)   Wt 63.2 kg   SpO2 91%   BMI 22.49 kg/m   Patient is feeling better, no further dizziness or lightheadedness, no edema or orthopnea.   Neurology awake and alert ENT with mild pallor Cardiovascular with S1 and S2 present and regular, positive murmur at the apex No JVD Trace lower extremity edema Respiratory with no rales or wheezing Abdomen with no distention   Condition at discharge: stable  The results of significant diagnostics from this hospitalization (including imaging, microbiology, ancillary and laboratory) are  listed below for reference.   Imaging Studies: DG Chest Portable 1 View  Result Date: 01/30/2023 CLINICAL DATA:  Shortness of breath EXAM: PORTABLE CHEST 1 VIEW COMPARISON:  01/16/2023, CT 10/07/2018 FINDINGS: Left-sided multi lead pacing device. Cardiomegaly with small bilateral effusions. Vascular congestion and probable mild interstitial edema. Numerous calcified nodules consistent with granulomas. No pneumothorax. IMPRESSION: Cardiomegaly with vascular congestion and probable mild interstitial edema. Small bilateral effusions. Electronically Signed   By: Jasmine Pang M.D.   On: 01/30/2023 20:12   ECHOCARDIOGRAM COMPLETE  Result Date: 01/16/2023    ECHOCARDIOGRAM REPORT   Patient Name:   [name] Date of Exam: 01/16/2023 Medical Rec #:  [id number]         Height:       66.0 in Accession #:    [id number]        Weight:       140.0 lb Date of Birth:  [birth year]         BSA:          1.719 m Patient Age:    88 years          BP:           127/58 mmHg Patient Gender: F                 HR:           64 bpm. Exam Location:  Inpatient Procedure: 2D Echo, Color Doppler and Cardiac Doppler Indications:     I50.9* Heart failure (unspecified)  History:         Patient has prior history of Echocardiogram examinations, most                  recent 02/15/2022. CHF, Defibrillator, COPD, Arrythmias:Atrial                  Fibrillation; Risk Factors:Dyslipidemia and Hypertension.  Sonographer:     Irving Burton Senior RDCS Referring Phys:  [id number] [name] Diagnosing Phys: [name] Busman McleanMD IMPRESSIONS  1. Left ventricular ejection fraction, by estimation, is 65 to 70%. Left ventricular ejection fraction by PLAX is 60 %. The left ventricle has normal function. The left ventricle has no regional wall motion abnormalities. There is mild concentric left ventricular hypertrophy. Left ventricular diastolic parameters are indeterminate.  2. D-shaped interventricular septum suggests RV pressure/volume overload. Right  ventricular systolic function is mildly reduced. The right ventricular size is mildly enlarged. There is severely elevated pulmonary artery systolic pressure. The estimated right ventricular systolic pressure is 103.0  Physician Discharge Summary   Patient: [name] MRN: [id number] DOB: 1934-03-28  Admit date:     01/30/2023  Discharge date: 02/06/23  Discharge Physician: York Ram Mikayela Deats   PCP: [name], MD   Recommendations at discharge:    Patient will continue diuresis at home with torsemide 100 mg po bid. Close follow up Hgb and Hct as outpatient, may need serial PRBC transfusions.  Follow up with Dr Sharen Hones in 7 to 10 days.  Follow up renal function and electrolytes in 7 days.  Follow up with Hematology/ Oncology as scheduled.   Discharge Diagnoses: Principal Problem:   Symptomatic anemia Active Problems:   Acute on chronic diastolic CHF (congestive heart failure) (HCC)   CKD stage 3b, GFR 30-44 ml/min (HCC)   COPD (chronic obstructive pulmonary disease) (HCC)   Permanent atrial fibrillation   Primary pancreatic neuroendocrine tumor   DNR (do not resuscitate)   TIA (transient ischemic attack)  Resolved Problems:   * No resolved hospital problems. [facility] Course: [name] was admitted with the working diagnosis of acute on chronic anemia, in the setting of heart failure decompensation.   [name] w chronic diastolic CHF with RV failure (EF 65-70% on 01/16/2023) s/p PPM/AICD, pulmonary HTN, permanent atrial fibrillation not on AC due to chronic GI bleeding/AVMs, chronic anemia with antibody/history of transfusion reactions, COPD/ILD, chronic hypoxic respiratory failure on 2-3 L O2 via [place], Pancreatic CA, CKD stage IIIb, HLD, RCC s/p right nephrectomy, history of TIA who presented from the heart failure clinic for evaluation of symptomatic GI bleeding.  Recent hospitalization for symptomatic anemia due to small bowel AVMs bleeding 09/11 to 01/18/23, She required 2 units PRBC transfusion with a discharge hgb at 8,0.  At home patient developed dyspnea on exertion, and progressive lower extremity edema, despite torsemide 100 mg bid.   Na 136, K 4,4 CL 98,  bicarbonate 27, glucose 104, bun 53, cr 1,62 BNP 322  High sensitive troponin 32 and 28  Wbc 4,6 hgb 6,6 plt 175   Chest radiograph with cardiomegaly, with vascular hilar congestion, small left pleural effusion, pacemaker defibrillation in place with one right atrial lead and biventricular leads.   EKG 63 bpm, left axis deviation, left bundle branch block, atrial fibrillation rhythm with no significant ST segment or T wave changes.   -Admitted, transfused 2 units PRBC 9/26, started on diuretics, given IV iron -Transfused PRBC again 9/29 -9/30, repeat IV iron  10/03 patient with improved volume status, Hgb has been stable at 8 range. ] Plan to continue medical therapy at home with close follow up as outpatient.   Assessment and Plan: * Symptomatic anemia Acute on chronic iron deficiency anemia.  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 (High risk due to RV failure).   Sp 3 units PRBC transfusion IV iron 2 doses.   Follow up Hgb is 8,5  Acute on chronic diastolic CHF (congestive heart failure) (HCC) Echocardiogram with preserved LV systolic function with EF 65 to 70%, mild LVH, D shaped interventricular septum, RV systolic function mildly reduced, RV mild enlargement, RVSP 103.0 mmHg, LA with moderate dilatation, RA with severe dilatation, moderate mitral valve stenosis, moderate aortic valve stenosis.   Patient was placed on furosemide for diuresis, negative fluid balance was achieved, -8,297  ml with significant improvement in her symptoms.  Patient now transitioned to torsemide.  Limited options due to reduced GFR, no RAAS inhibition.  Physician Discharge Summary   Patient: [name] MRN: [id number] DOB: 1934-03-28  Admit date:     01/30/2023  Discharge date: 02/06/23  Discharge Physician: York Ram Mikayela Deats   PCP: [name], MD   Recommendations at discharge:    Patient will continue diuresis at home with torsemide 100 mg po bid. Close follow up Hgb and Hct as outpatient, may need serial PRBC transfusions.  Follow up with Dr Sharen Hones in 7 to 10 days.  Follow up renal function and electrolytes in 7 days.  Follow up with Hematology/ Oncology as scheduled.   Discharge Diagnoses: Principal Problem:   Symptomatic anemia Active Problems:   Acute on chronic diastolic CHF (congestive heart failure) (HCC)   CKD stage 3b, GFR 30-44 ml/min (HCC)   COPD (chronic obstructive pulmonary disease) (HCC)   Permanent atrial fibrillation   Primary pancreatic neuroendocrine tumor   DNR (do not resuscitate)   TIA (transient ischemic attack)  Resolved Problems:   * No resolved hospital problems. [facility] Course: [name] was admitted with the working diagnosis of acute on chronic anemia, in the setting of heart failure decompensation.   [name] w chronic diastolic CHF with RV failure (EF 65-70% on 01/16/2023) s/p PPM/AICD, pulmonary HTN, permanent atrial fibrillation not on AC due to chronic GI bleeding/AVMs, chronic anemia with antibody/history of transfusion reactions, COPD/ILD, chronic hypoxic respiratory failure on 2-3 L O2 via [place], Pancreatic CA, CKD stage IIIb, HLD, RCC s/p right nephrectomy, history of TIA who presented from the heart failure clinic for evaluation of symptomatic GI bleeding.  Recent hospitalization for symptomatic anemia due to small bowel AVMs bleeding 09/11 to 01/18/23, She required 2 units PRBC transfusion with a discharge hgb at 8,0.  At home patient developed dyspnea on exertion, and progressive lower extremity edema, despite torsemide 100 mg bid.   Na 136, K 4,4 CL 98,  bicarbonate 27, glucose 104, bun 53, cr 1,62 BNP 322  High sensitive troponin 32 and 28  Wbc 4,6 hgb 6,6 plt 175   Chest radiograph with cardiomegaly, with vascular hilar congestion, small left pleural effusion, pacemaker defibrillation in place with one right atrial lead and biventricular leads.   EKG 63 bpm, left axis deviation, left bundle branch block, atrial fibrillation rhythm with no significant ST segment or T wave changes.   -Admitted, transfused 2 units PRBC 9/26, started on diuretics, given IV iron -Transfused PRBC again 9/29 -9/30, repeat IV iron  10/03 patient with improved volume status, Hgb has been stable at 8 range. ] Plan to continue medical therapy at home with close follow up as outpatient.   Assessment and Plan: * Symptomatic anemia Acute on chronic iron deficiency anemia.  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 (High risk due to RV failure).   Sp 3 units PRBC transfusion IV iron 2 doses.   Follow up Hgb is 8,5  Acute on chronic diastolic CHF (congestive heart failure) (HCC) Echocardiogram with preserved LV systolic function with EF 65 to 70%, mild LVH, D shaped interventricular septum, RV systolic function mildly reduced, RV mild enlargement, RVSP 103.0 mmHg, LA with moderate dilatation, RA with severe dilatation, moderate mitral valve stenosis, moderate aortic valve stenosis.   Patient was placed on furosemide for diuresis, negative fluid balance was achieved, -8,297  ml with significant improvement in her symptoms.  Patient now transitioned to torsemide.  Limited options due to reduced GFR, no RAAS inhibition.  Physician Discharge Summary   Patient: [name] MRN: [id number] DOB: 1934-03-28  Admit date:     01/30/2023  Discharge date: 02/06/23  Discharge Physician: York Ram Mikayela Deats   PCP: [name], MD   Recommendations at discharge:    Patient will continue diuresis at home with torsemide 100 mg po bid. Close follow up Hgb and Hct as outpatient, may need serial PRBC transfusions.  Follow up with Dr Sharen Hones in 7 to 10 days.  Follow up renal function and electrolytes in 7 days.  Follow up with Hematology/ Oncology as scheduled.   Discharge Diagnoses: Principal Problem:   Symptomatic anemia Active Problems:   Acute on chronic diastolic CHF (congestive heart failure) (HCC)   CKD stage 3b, GFR 30-44 ml/min (HCC)   COPD (chronic obstructive pulmonary disease) (HCC)   Permanent atrial fibrillation   Primary pancreatic neuroendocrine tumor   DNR (do not resuscitate)   TIA (transient ischemic attack)  Resolved Problems:   * No resolved hospital problems. [facility] Course: [name] was admitted with the working diagnosis of acute on chronic anemia, in the setting of heart failure decompensation.   [name] w chronic diastolic CHF with RV failure (EF 65-70% on 01/16/2023) s/p PPM/AICD, pulmonary HTN, permanent atrial fibrillation not on AC due to chronic GI bleeding/AVMs, chronic anemia with antibody/history of transfusion reactions, COPD/ILD, chronic hypoxic respiratory failure on 2-3 L O2 via [place], Pancreatic CA, CKD stage IIIb, HLD, RCC s/p right nephrectomy, history of TIA who presented from the heart failure clinic for evaluation of symptomatic GI bleeding.  Recent hospitalization for symptomatic anemia due to small bowel AVMs bleeding 09/11 to 01/18/23, She required 2 units PRBC transfusion with a discharge hgb at 8,0.  At home patient developed dyspnea on exertion, and progressive lower extremity edema, despite torsemide 100 mg bid.   Na 136, K 4,4 CL 98,  bicarbonate 27, glucose 104, bun 53, cr 1,62 BNP 322  High sensitive troponin 32 and 28  Wbc 4,6 hgb 6,6 plt 175   Chest radiograph with cardiomegaly, with vascular hilar congestion, small left pleural effusion, pacemaker defibrillation in place with one right atrial lead and biventricular leads.   EKG 63 bpm, left axis deviation, left bundle branch block, atrial fibrillation rhythm with no significant ST segment or T wave changes.   -Admitted, transfused 2 units PRBC 9/26, started on diuretics, given IV iron -Transfused PRBC again 9/29 -9/30, repeat IV iron  10/03 patient with improved volume status, Hgb has been stable at 8 range. ] Plan to continue medical therapy at home with close follow up as outpatient.   Assessment and Plan: * Symptomatic anemia Acute on chronic iron deficiency anemia.  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 (High risk due to RV failure).   Sp 3 units PRBC transfusion IV iron 2 doses.   Follow up Hgb is 8,5  Acute on chronic diastolic CHF (congestive heart failure) (HCC) Echocardiogram with preserved LV systolic function with EF 65 to 70%, mild LVH, D shaped interventricular septum, RV systolic function mildly reduced, RV mild enlargement, RVSP 103.0 mmHg, LA with moderate dilatation, RA with severe dilatation, moderate mitral valve stenosis, moderate aortic valve stenosis.   Patient was placed on furosemide for diuresis, negative fluid balance was achieved, -8,297  ml with significant improvement in her symptoms.  Patient now transitioned to torsemide.  Limited options due to reduced GFR, no RAAS inhibition.  Physician Discharge Summary   Patient: [name] MRN: [id number] DOB: 1934-03-28  Admit date:     01/30/2023  Discharge date: 02/06/23  Discharge Physician: York Ram Mikayela Deats   PCP: [name], MD   Recommendations at discharge:    Patient will continue diuresis at home with torsemide 100 mg po bid. Close follow up Hgb and Hct as outpatient, may need serial PRBC transfusions.  Follow up with Dr Sharen Hones in 7 to 10 days.  Follow up renal function and electrolytes in 7 days.  Follow up with Hematology/ Oncology as scheduled.   Discharge Diagnoses: Principal Problem:   Symptomatic anemia Active Problems:   Acute on chronic diastolic CHF (congestive heart failure) (HCC)   CKD stage 3b, GFR 30-44 ml/min (HCC)   COPD (chronic obstructive pulmonary disease) (HCC)   Permanent atrial fibrillation   Primary pancreatic neuroendocrine tumor   DNR (do not resuscitate)   TIA (transient ischemic attack)  Resolved Problems:   * No resolved hospital problems. [facility] Course: [name] was admitted with the working diagnosis of acute on chronic anemia, in the setting of heart failure decompensation.   [name] w chronic diastolic CHF with RV failure (EF 65-70% on 01/16/2023) s/p PPM/AICD, pulmonary HTN, permanent atrial fibrillation not on AC due to chronic GI bleeding/AVMs, chronic anemia with antibody/history of transfusion reactions, COPD/ILD, chronic hypoxic respiratory failure on 2-3 L O2 via [place], Pancreatic CA, CKD stage IIIb, HLD, RCC s/p right nephrectomy, history of TIA who presented from the heart failure clinic for evaluation of symptomatic GI bleeding.  Recent hospitalization for symptomatic anemia due to small bowel AVMs bleeding 09/11 to 01/18/23, She required 2 units PRBC transfusion with a discharge hgb at 8,0.  At home patient developed dyspnea on exertion, and progressive lower extremity edema, despite torsemide 100 mg bid.   Na 136, K 4,4 CL 98,  bicarbonate 27, glucose 104, bun 53, cr 1,62 BNP 322  High sensitive troponin 32 and 28  Wbc 4,6 hgb 6,6 plt 175   Chest radiograph with cardiomegaly, with vascular hilar congestion, small left pleural effusion, pacemaker defibrillation in place with one right atrial lead and biventricular leads.   EKG 63 bpm, left axis deviation, left bundle branch block, atrial fibrillation rhythm with no significant ST segment or T wave changes.   -Admitted, transfused 2 units PRBC 9/26, started on diuretics, given IV iron -Transfused PRBC again 9/29 -9/30, repeat IV iron  10/03 patient with improved volume status, Hgb has been stable at 8 range. ] Plan to continue medical therapy at home with close follow up as outpatient.   Assessment and Plan: * Symptomatic anemia Acute on chronic iron deficiency anemia.  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 (High risk due to RV failure).   Sp 3 units PRBC transfusion IV iron 2 doses.   Follow up Hgb is 8,5  Acute on chronic diastolic CHF (congestive heart failure) (HCC) Echocardiogram with preserved LV systolic function with EF 65 to 70%, mild LVH, D shaped interventricular septum, RV systolic function mildly reduced, RV mild enlargement, RVSP 103.0 mmHg, LA with moderate dilatation, RA with severe dilatation, moderate mitral valve stenosis, moderate aortic valve stenosis.   Patient was placed on furosemide for diuresis, negative fluid balance was achieved, -8,297  ml with significant improvement in her symptoms.  Patient now transitioned to torsemide.  Limited options due to reduced GFR, no RAAS inhibition.

## 2023-02-07 ENCOUNTER — Telehealth: Payer: Self-pay | Admitting: Family Medicine

## 2023-02-07 ENCOUNTER — Inpatient Hospital Stay: Payer: PPO | Admitting: Family Medicine

## 2023-02-07 NOTE — Telephone Encounter (Signed)
Lvm (on secured vm per message) for Drenda Freeze relaying Dr Timoteo Expose message.

## 2023-02-07 NOTE — Telephone Encounter (Signed)
Home Health verbal orders Caller Name: Drenda Freeze Agency Name: [facility] number: [phone]  Requesting verbal order for start of palliative care   Please forward to [facility] pool or providers CMA

## 2023-02-07 NOTE — Telephone Encounter (Signed)
Agree with this. I can be attending if needed.

## 2023-02-09 DIAGNOSIS — M10372 Gout due to renal impairment, left ankle and foot: Secondary | ICD-10-CM | POA: Diagnosis not present

## 2023-02-09 DIAGNOSIS — D3A8 Other benign neuroendocrine tumors: Secondary | ICD-10-CM | POA: Diagnosis not present

## 2023-02-09 DIAGNOSIS — I4821 Permanent atrial fibrillation: Secondary | ICD-10-CM | POA: Diagnosis not present

## 2023-02-09 DIAGNOSIS — B029 Zoster without complications: Secondary | ICD-10-CM | POA: Diagnosis not present

## 2023-02-09 DIAGNOSIS — I5033 Acute on chronic diastolic (congestive) heart failure: Secondary | ICD-10-CM | POA: Diagnosis not present

## 2023-02-09 DIAGNOSIS — I6523 Occlusion and stenosis of bilateral carotid arteries: Secondary | ICD-10-CM | POA: Diagnosis not present

## 2023-02-09 DIAGNOSIS — J449 Chronic obstructive pulmonary disease, unspecified: Secondary | ICD-10-CM | POA: Diagnosis not present

## 2023-02-09 DIAGNOSIS — I130 Hypertensive heart and chronic kidney disease with heart failure and stage 1 through stage 4 chronic kidney disease, or unspecified chronic kidney disease: Secondary | ICD-10-CM | POA: Diagnosis not present

## 2023-02-09 DIAGNOSIS — M1990 Unspecified osteoarthritis, unspecified site: Secondary | ICD-10-CM | POA: Diagnosis not present

## 2023-02-09 DIAGNOSIS — I272 Pulmonary hypertension, unspecified: Secondary | ICD-10-CM | POA: Diagnosis not present

## 2023-02-09 DIAGNOSIS — D500 Iron deficiency anemia secondary to blood loss (chronic): Secondary | ICD-10-CM | POA: Diagnosis not present

## 2023-02-09 DIAGNOSIS — M810 Age-related osteoporosis without current pathological fracture: Secondary | ICD-10-CM | POA: Diagnosis not present

## 2023-02-09 DIAGNOSIS — F4322 Adjustment disorder with anxiety: Secondary | ICD-10-CM | POA: Diagnosis not present

## 2023-02-09 DIAGNOSIS — K922 Gastrointestinal hemorrhage, unspecified: Secondary | ICD-10-CM | POA: Diagnosis not present

## 2023-02-09 DIAGNOSIS — I872 Venous insufficiency (chronic) (peripheral): Secondary | ICD-10-CM | POA: Diagnosis not present

## 2023-02-09 DIAGNOSIS — J9611 Chronic respiratory failure with hypoxia: Secondary | ICD-10-CM | POA: Diagnosis not present

## 2023-02-09 DIAGNOSIS — I350 Nonrheumatic aortic (valve) stenosis: Secondary | ICD-10-CM | POA: Diagnosis not present

## 2023-02-09 DIAGNOSIS — I2510 Atherosclerotic heart disease of native coronary artery without angina pectoris: Secondary | ICD-10-CM | POA: Diagnosis not present

## 2023-02-09 DIAGNOSIS — J849 Interstitial pulmonary disease, unspecified: Secondary | ICD-10-CM | POA: Diagnosis not present

## 2023-02-09 DIAGNOSIS — N1832 Chronic kidney disease, stage 3b: Secondary | ICD-10-CM | POA: Diagnosis not present

## 2023-02-09 DIAGNOSIS — I739 Peripheral vascular disease, unspecified: Secondary | ICD-10-CM | POA: Diagnosis not present

## 2023-02-09 DIAGNOSIS — E7800 Pure hypercholesterolemia, unspecified: Secondary | ICD-10-CM | POA: Diagnosis not present

## 2023-02-09 DIAGNOSIS — L570 Actinic keratosis: Secondary | ICD-10-CM | POA: Diagnosis not present

## 2023-02-09 DIAGNOSIS — N3941 Urge incontinence: Secondary | ICD-10-CM | POA: Diagnosis not present

## 2023-02-10 ENCOUNTER — Encounter: Payer: Self-pay | Admitting: Family Medicine

## 2023-02-10 ENCOUNTER — Telehealth: Payer: Self-pay | Admitting: *Deleted

## 2023-02-10 ENCOUNTER — Ambulatory Visit: Payer: PPO | Admitting: Cardiology

## 2023-02-10 NOTE — Transitions of Care (Post Inpatient/ED Visit) (Signed)
02/10/2023  Name: [name] MRN: [id number] DOB: [birth year]  Today's TOC FU Call Status: Today's TOC FU Call Status:: Successful TOC FU Call Completed TOC FU Call Complete Date: 02/10/23 Patient's Name and Date of Birth confirmed.  Transition Care Management Follow-up Telephone Call Date of Discharge: 02/06/23 Discharge Facility: [facility]) Type of Discharge: Inpatient Admission Primary Inpatient Discharge Diagnosis:: symptomatic anemia How have you been since you were released from the hospital?: Better (my legs are a little swollen but have went down alot. I have a little runny nose) Any questions or concerns?: No  Items Reviewed: Did you receive and understand the discharge instructions provided?: No Medications obtained,verified, and reconciled?: Yes (Medications Reviewed) (patient wasn't sure about the torsemide. RN went over the dosage with the patient) Any new allergies since your discharge?: No Dietary orders reviewed?: No Do you have support at home?: Yes People in Home: grandchild(ren) Name of Support/Comfort Primary Source: daughter and niece takes the patient to her appts  Medications Reviewed Today: Medications Reviewed Today     Reviewed by Luella Cook, RN (Case Manager) on 02/10/23 at 1049  Med List Status: <None>   Medication Order Taking? Sig Documenting Provider Last Dose Status Informant  acetaminophen (TYLENOL) 500 MG tablet [id number] Yes Take 1,000 mg by mouth daily. [provider] Taking Active Self, Pharmacy Records  Ascorbic Acid (VITAMIN C PO) [id number] Yes Take 1 tablet by mouth daily. [provider] Taking Active Self, Pharmacy Records  Cholecalciferol (VITAMIN D-3 PO) [id number] Yes Take 2 capsules by mouth daily. [provider] Taking Active Self, Pharmacy Records  Cyanocobalamin (B-12 PO) [id number] Yes Take 1 tablet by mouth daily. [provider] Taking Active Self, Pharmacy Records            Med Note [facility] [place], Ruthell Rummage   Tue Jun 27, 2020 12:32 PM)    ferrous gluconate (FERGON) 324 MG tablet [id number] Yes Take 1 tablet (324 mg total) by mouth daily with breakfast. Rai, Delene Ruffini, MD Taking Active Self, Pharmacy Records  fluticasone [facility]) 50 MCG/ACT nasal spray [id number] Yes Place 2 sprays into both nostrils daily as needed for allergies or rhinitis. [name], MD Taking Active Self, Pharmacy Records  Multiple Vitamins-Minerals [facility] SKIN NAILS PO) [id number] Yes Take 1 capsule by mouth daily. [provider] Taking Active Self, Pharmacy Records  pantoprazole (PROTONIX) 40 MG tablet [id number] Yes Take 1 tablet (40 mg total) by mouth daily. Cathren Harsh, MD Taking Active Self, Pharmacy Records           Med Note [facility], Herb Grays Jan 30, 2023  7:05 PM) Pt states she takes daily. Per dispense report, LF 11/09/2022 #30, 30 DS.  potassium chloride SA (KLOR-CON M) 20 MEQ tablet [id number] Yes Take 1 tablet (20 mEq total) by mouth daily. Arrien, York Ram, MD Taking Active   rosuvastatin (CRESTOR) 40 MG tablet [id number] Yes Take 1 tablet (40 mg total) by mouth daily.  Patient taking differently: Take 40 mg by mouth every evening.   [name], MD Taking Active Self, Pharmacy Records           Med Note (COFFELL, Marzella Schlein   Thu Jan 30, 2023  7:07 PM) Pt states she takes every day. Per dispense report, LF 05/02/2022 #30, 30 DS.  torsemide (DEMADEX) 20 MG tablet [id number] Yes Take 5 tablets (100 mg total) by mouth 2 (two) times daily. [place], Eric J, DO Taking Active Self, Pharmacy Records  Med Note (COFFELL, Marzella Schlein   Thu Jan 30, 2023  7:07 PM)              Home Care and Equipment/Supplies: Were Home Health Services Ordered?: Yes [facility] Agency[facility] Has Agency set up a time to come to your home?: Yes First Home Health Visit Date: 02/09/23 Any new equipment or medical supplies ordered?: No  Functional  Questionnaire: Do you need assistance with bathing/showering or dressing?: No Do you need assistance with meal preparation?: No Do you need assistance with eating?: No Do you have difficulty maintaining continence: No Do you need assistance with getting out of bed/getting out of a chair/moving?: No Do you have difficulty managing or taking your medications?: No  Follow up appointments reviewed: PCP Follow-up appointment confirmed?: Yes Date of PCP follow-up appointment?: 02/19/23 Follow-up Provider: [facility] Follow-up appointment confirmed?: Yes Date of Specialist follow-up appointment?: 02/09/23 Follow-Up Specialty Provider:: oncology Do you need transportation to your follow-up appointment?: No Do you understand care options if your condition(s) worsen?: Yes-patient verbalized understanding  SDOH Interventions Today    Flowsheet Row Most Recent Value  SDOH Interventions   Food Insecurity Interventions Intervention Not Indicated  Housing Interventions Intervention Not Indicated  Transportation Interventions Intervention Not Indicated, Patient Resources (Friends/Family)  Utilities Interventions Intervention Not Indicated      Interventions Today    Flowsheet Row Most Recent Value  General Interventions   General Interventions Discussed/Reviewed General Interventions Discussed, General Interventions Reviewed, Programmer, applications, Communication with  [employer] ordered Covid testing kits/ RN made George Ina of patient discharged and f/u]  Doctor Visits Discussed/Reviewed Doctor Visits Discussed, Doctor Visits Reviewed, PCP, Specialist  PCP/Specialist Visits Compliance with follow-up visit  [RN discussed patient missed appt. Per patient it was made while she was inpt. She stated she felt so weak and had forgotten it.]  Exercise Interventions   Exercise Discussed/Reviewed Exercise Discussed  [[facility] will be following up with PT]  Education Interventions    Education Provided Provided Education  Provided Verbal Education On Medication, Community Resources  Pharmacy Interventions   Pharmacy Dicussed/Reviewed Pharmacy Topics Discussed      TOC Interventions Today    Flowsheet Row Most Recent Value  TOC Interventions   TOC Interventions Discussed/Reviewed TOC Interventions Discussed, TOC Interventions Reviewed, Arranged PCP follow up less than 12 days/Care Guide scheduled     Patient declined additional outreach calls from [facility] car Coordination since receiving calls from [employer].    Gean Maidens BSN RN [facility] Care Management [phone]

## 2023-02-10 NOTE — Progress Notes (Deleted)
Cardiology Office Note Date:  02/10/2023  Patient ID:  [name], [name] [birth year], MRN [id number] PCP:  [name], MD  Cardiologist:  Sherryl Manges, MD Electrophysiologist: None  ***refresh   Chief Complaint: ***  History of Present Illness: [name] is a 89 y.o. female with PMH notable for NICM, HFimpEF, perm AFib, pulmHTN, GI bleed, IDA, CKD-3b, COPD; seen today for Dr. Graciela Husbands for routine electrophysiology followup.  She last saw Dr. Graciela Husbands 06/2022, HV therapies disabled. Patient is DNR/DNI. She saw HF 9/26 where she was fluid overloaded and having melena, admitted from clinic. S/p 3u PRBC, IV iron x 2. Hgb at discharge 8.5. She was discharged on torsemide 100mg  BID.   On follow-up today,  *** fluid status *** breathing    Since last being seen in our clinic the patient reports doing ***.  she denies chest pain, palpitations, dyspnea, PND, orthopnea, nausea, vomiting, dizziness, syncope, edema, weight gain, or early satiety.     Device Information: ***  AAD History: Amio - nausea Tikosyn - ineffective Disopyramide - ineffective   Past Medical History:  Diagnosis Date   (HFpEF) heart failure with preserved ejection fraction (HCC)    EF=60-60%   Actinic keratosis 01/17/2015   R forearm   Adjustment disorder with anxiety    Adverse effect of other narcotics, sequela    Intolerance to all narcotics   Anti-Duffy antibodies present    Aortic valve stenosis    Arthritis    "some in my hands" (11/11/2012)   Atrial fibrillation, permanent (HCC)    Eliquis   Atypical mole 03/25/2018   L forearm - severe   Automatic implantable cardioverter-defibrillator in situ    Avascular necrosis of hip (HCC) 05/03/2011   Carotid artery stenosis 09/2007   a. 09/2007: 60-79% bilateral (stable); b. 10/2008: 40-59% R 60-79%    Cellulitis of left lower extremity 07/13/2020   CKD (chronic kidney disease), stage III (HCC)    COPD (chronic obstructive pulmonary disease)  (HCC)    Coronary artery disease    non-obstructive by 2006 cath   COVID-19 virus infection 02/12/2022   Displaced fracture of left femoral neck (HCC) 10/09/2018   GI bleed 03/28/2020   AVM   High cholesterol    Hypertension 05/20/2011   Hypertr obst cardiomyop    Hypotension, unspecified    cardiac cath 2006..nonobstructive CAD 30-40s lesions.Marland KitchenETT 1/09 nondiagnostic due to poor HR response..Right Renal Cancer 2003   Iron deficiency anemia    Long term (current) use of anticoagulants    On home oxygen therapy    3L [place]   Osteoarthritis of right hip    PAD (peripheral artery disease) (HCC)    Pancreatic cancer (HCC)    sandostatin   PONV (postoperative nausea and vomiting)    Presence of permanent cardiac pacemaker    Pulmonary HTN (HCC)    RECTAL BLEEDING 10/13/2009   Qualifier: Diagnosis of  By: Wilmon Pali NP, Paula     Red blood cell antibody positive with compatible PRBC difficult to obtain    Anti FYA (Duffy a) antibody. Must be transfused with PRB which are Duffy Antigen Negative and Crossmatch Compatible   Renal cell carcinoma (HCC)    s/p nephrectomy   Squamous cell carcinoma of skin 01/17/2015   R lat wrist   Squamous cell carcinoma of skin 01/29/2018   R post upper leg - superficially invasive   Squamous cell carcinoma of skin 03/25/2018   L lat foot   Squamous cell carcinoma  of skin 11/09/2020   left lat foot - EDC 01/01/21, recurrent 01/31/21 - MOHs 02/22/21   Squamous cell carcinoma of skin 12/19/2021   Right Posterior Medial Thigh, EDC   Squamous cell carcinoma of skin 12/19/2021   SCC IS, L lat heel, EDC 02/04/2022   Squamous cell carcinoma of skin 01/21/2022   L forearm, EDC 02/04/2022   Squamous cell carcinoma of skin 01/21/2022   R lower leg below knee, EDC 02/04/2022   Squamous cell carcinoma of skin 01/21/2022   SCCIS, R post heel, EDC 02/04/2022   Squamous cell carcinoma of skin 07/01/2022   left lower abdomen, in situ, EDC   Squamous cell carcinoma of  skin 07/01/2022   left medial chest, EDC   Urge incontinence    Venous stasis of both lower extremities     Past Surgical History:  Procedure Laterality Date   ABDOMINAL AORTOGRAM W/LOWER EXTREMITY N/A 02/12/2021   Procedure: ABDOMINAL AORTOGRAM W/LOWER EXTREMITY;  Surgeon: Maeola Harman, MD;  Location: [facility] INVASIVE CV LAB;  Service: Cardiovascular;  Laterality: N/A;   ABDOMINAL HYSTERECTOMY  1975   for benign causes   APPENDECTOMY     BI-VENTRICULAR PACEMAKER UPGRADE  05/04/2010   BIOPSY  02/28/2019   Procedure: BIOPSY;  Surgeon: Rachael Fee, MD;  Location: [facility] ENDOSCOPY;  Service: Endoscopy;;   BIOPSY  03/04/2022   Procedure: BIOPSY;  Surgeon: Lemar Lofty., MD;  Location: [facility] ENDOSCOPY;  Service: Gastroenterology;;   CARDIAC CATHETERIZATION  2006   CARDIOVERSION N/A 02/20/2018   Procedure: CARDIOVERSION;  Surgeon: Antonieta Iba, MD;  Location: [facility] ORS;  Service: Cardiovascular;  Laterality: N/A;   CARDIOVERSION N/A 03/27/2018   Procedure: CARDIOVERSION;  Surgeon: Antonieta Iba, MD;  Location: [facility] ORS;  Service: Cardiovascular;  Laterality: N/A;   CATARACT EXTRACTION W/ INTRAOCULAR LENS  IMPLANT, BILATERAL  01/2006-02-2006   CHOLECYSTECTOMY N/A 11/11/2012   Procedure: LAPAROSCOPIC CHOLECYSTECTOMY WITH INTRAOPERATIVE CHOLANGIOGRAM;  Surgeon: Wilmon Arms. Corliss Skains, MD;  Location: [facility] OR;  Service: General;  Laterality: N/A;   COLONOSCOPY WITH PROPOFOL N/A 02/28/2019   Procedure: COLONOSCOPY WITH PROPOFOL;  Surgeon: Rachael Fee, MD;  Location: [facility] ENDOSCOPY;  Service: Endoscopy;  Laterality: N/A;   ENTEROSCOPY N/A 03/30/2020   Procedure: ENTEROSCOPY;  Surgeon: Shellia Cleverly, DO;  Location: [facility] ENDOSCOPY;  Service: Gastroenterology;  Laterality: N/A;   ENTEROSCOPY N/A 02/10/2021   Procedure: ENTEROSCOPY;  Surgeon: Jeani Hawking, MD;  Location: [facility] ENDOSCOPY;  Service: Endoscopy;  Laterality: N/A;   ENTEROSCOPY N/A 02/15/2022   Procedure: ENTEROSCOPY;   Surgeon: Imogene Burn, MD;  Location: [facility] ENDOSCOPY;  Service: Gastroenterology;  Laterality: N/A;   ENTEROSCOPY N/A 11/06/2022   Procedure: ENTEROSCOPY;  Surgeon: Meridee Score Netty Starring., MD;  Location: [facility] ENDOSCOPY;  Service: Gastroenterology;  Laterality: N/A;   EP IMPLANTABLE DEVICE N/A 02/21/2016   Procedure: ICD Generator Changeout;  Surgeon: Duke Salvia, MD;  Location: [facility] INVASIVE CV LAB;  Service: Cardiovascular;  Laterality: N/A;   ESOPHAGOGASTRODUODENOSCOPY N/A 03/04/2022   Procedure: ESOPHAGOGASTRODUODENOSCOPY (EGD);  Surgeon: Lemar Lofty., MD;  Location: [facility] ENDOSCOPY;  Service: Gastroenterology;  Laterality: N/A;   ESOPHAGOGASTRODUODENOSCOPY (EGD) WITH PROPOFOL N/A 02/28/2019   Procedure: ESOPHAGOGASTRODUODENOSCOPY (EGD) WITH PROPOFOL;  Surgeon: Rachael Fee, MD;  Location: [facility] ENDOSCOPY;  Service: Endoscopy;  Laterality: N/A;   EUS N/A 03/04/2022   Procedure: UPPER ENDOSCOPIC ULTRASOUND (EUS) RADIAL;  Surgeon: Lemar Lofty., MD;  Location: [facility] ENDOSCOPY;  Service: Gastroenterology;  Laterality: N/A;   FINE NEEDLE ASPIRATION N/A 03/04/2022  Procedure: FINE NEEDLE ASPIRATION (FNA) LINEAR;  Surgeon: Lemar Lofty., MD;  Location: [facility] ENDOSCOPY;  Service: Gastroenterology;  Laterality: N/A;   GIVENS CAPSULE STUDY N/A 03/15/2019   Procedure: GIVENS CAPSULE STUDY;  Surgeon: Tressia Danas, MD;  Location: [facility] ENDOSCOPY;  Service: Gastroenterology;  Laterality: N/A;   HOT HEMOSTASIS N/A 03/30/2020   Procedure: HOT HEMOSTASIS (ARGON PLASMA COAGULATION/BICAP);  Surgeon: Shellia Cleverly, DO;  Location: [facility] ENDOSCOPY;  Service: Gastroenterology;  Laterality: N/A;   HOT HEMOSTASIS N/A 02/10/2021   Procedure: HOT HEMOSTASIS (ARGON PLASMA COAGULATION/BICAP);  Surgeon: Jeani Hawking, MD;  Location: [facility] ENDOSCOPY;  Service: Endoscopy;  Laterality: N/A;   HOT HEMOSTASIS N/A 02/15/2022   Procedure: HOT HEMOSTASIS (ARGON PLASMA COAGULATION/BICAP);  Surgeon: Imogene Burn, MD;  Location: [facility] ENDOSCOPY;  Service: Gastroenterology;  Laterality: N/A;   HOT HEMOSTASIS N/A 11/06/2022   Procedure: HOT HEMOSTASIS (ARGON PLASMA COAGULATION/BICAP);  Surgeon: Lemar Lofty., MD;  Location: [facility] ENDOSCOPY;  Service: Gastroenterology;  Laterality: N/A;   INSERT / REPLACE / REMOVE PACEMAKER  05-01-11   02-28-05-/05-04-10-ICD-MEDTRONIC MAXIMAL DR   IR ANGIOGRAM SELECTIVE EACH ADDITIONAL VESSEL  02/18/2022   IR ANGIOGRAM SELECTIVE EACH ADDITIONAL VESSEL  02/18/2022   IR ANGIOGRAM VISCERAL SELECTIVE  02/16/2022   IR ANGIOGRAM VISCERAL SELECTIVE  02/16/2022   IR EMBO ART  VEN HEMORR LYMPH EXTRAV  INC GUIDE ROADMAPPING  02/16/2022   IR THORACENTESIS ASP PLEURAL SPACE W/IMG GUIDE  02/21/2022   IR THORACENTESIS ASP PLEURAL SPACE W/IMG GUIDE  02/22/2022   IR US GUIDE VASC ACCESS RIGHT  02/16/2022   JOINT REPLACEMENT     LAPAROSCOPIC CHOLECYSTECTOMY  11/11/2012   LAPAROSCOPIC LYSIS OF ADHESIONS N/A 11/11/2012   Procedure: LAPAROSCOPIC LYSIS OF ADHESIONS;  Surgeon: Wilmon Arms. Corliss Skains, MD;  Location: [facility] OR;  Service: General;  Laterality: N/A;   NEPHRECTOMY Right 06/2001    S/P RENAL CELL CANCER   PERIPHERAL VASCULAR INTERVENTION Bilateral 02/12/2021   Procedure: PERIPHERAL VASCULAR INTERVENTION;  Surgeon: Maeola Harman, MD;  Location: [facility] INVASIVE CV LAB;  Service: Cardiovascular;  Laterality: Bilateral;  Iliac artery stents   PRESSURE SENSOR/CARDIOMEMS N/A 02/03/2019   Procedure: PRESSURE SENSOR/CARDIOMEMS;  Surgeon: Laurey Morale, MD;  Location: [facility] INVASIVE CV LAB;  Service: Cardiovascular;  Laterality: N/A;   RIGHT HEART CATH N/A 11/09/2018   Procedure: RIGHT HEART CATH;  Surgeon: Laurey Morale, MD;  Location: [facility] INVASIVE CV LAB;  Service: Cardiovascular;  Laterality: N/A;   RIGHT HEART CATH N/A 03/08/2019   Procedure: RIGHT HEART CATH;  Surgeon: Laurey Morale, MD;  Location: [facility] INVASIVE CV LAB;  Service: Cardiovascular;  Laterality: N/A;   SUBMUCOSAL  TATTOO INJECTION  02/28/2019   Procedure: SUBMUCOSAL TATTOO INJECTION;  Surgeon: Rachael Fee, MD;  Location: [facility] ENDOSCOPY;  Service: Endoscopy;;   SUBMUCOSAL TATTOO INJECTION  11/06/2022   Procedure: SUBMUCOSAL TATTOO INJECTION;  Surgeon: Lemar Lofty., MD;  Location: [facility] ENDOSCOPY;  Service: Gastroenterology;;   TOTAL HIP ARTHROPLASTY Right 05/03/2011   Procedure: TOTAL HIP ARTHROPLASTY ANTERIOR APPROACH;  Surgeon: Kathryne Hitch;  Location: [facility] ORS;  Service: Orthopedics;  Laterality: Right;  Removal of Cannulated Screws Right Hip, Right Direct Anterior Hip Replacement   TOTAL HIP ARTHROPLASTY Left 10/09/2018   Procedure: TOTAL HIP ARTHROPLASTY ANTERIOR APPROACH;  Surgeon: Samson Frederic, MD;  Location: [facility] OR;  Service: Orthopedics;  Laterality: Left;    Current Outpatient Medications  Medication Instructions   acetaminophen (TYLENOL) 1,000 mg, Oral, Daily   Ascorbic Acid (VITAMIN C PO) 1  tablet, Oral, Daily   Cholecalciferol (VITAMIN D-3 PO) 2 capsules, Oral, Daily   Cyanocobalamin (B-12 PO) 1 tablet, Oral, Daily   ferrous gluconate (FERGON) 324 mg, Oral, Daily with breakfast   fluticasone (FLONASE) 50 MCG/ACT nasal spray 2 sprays, Each Nare, Daily PRN   Multiple Vitamins-Minerals (HAIR SKIN NAILS PO) 1 capsule, Oral, Daily   pantoprazole (PROTONIX) 40 mg, Oral, Daily   potassium chloride SA (KLOR-CON M) 20 MEQ tablet 20 mEq, Oral, Daily   rosuvastatin (CRESTOR) 40 mg, Oral, Daily   torsemide (DEMADEX) 100 mg, Oral, 2 times daily    Social History:  The patient  reports that she quit smoking about 21 years ago. Her smoking use included cigarettes. She started smoking about 61 years ago. She has a 20 pack-year smoking history. She has never used smokeless tobacco. She reports that she does not drink alcohol and does not use drugs.   Family History:  *** include only if pertinent The patient's family history includes Breast cancer in her cousin and another family member;  Breast cancer (age of onset: 16) in her maternal aunt; Early death in her father; Heart failure in her mother.***  ROS:  Please see the history of present illness. All other systems are reviewed and otherwise negative.   PHYSICAL EXAM: *** VS:  There were no vitals taken for this visit. BMI: There is no height or weight on file to calculate BMI.  GEN- The patient is well appearing, alert and oriented x 3 today.   Lungs- Clear to ausculation bilaterally, normal work of breathing.  Heart- {Blank single:19197::"Regular","Irregularly irregular"} rate and rhythm, no murmurs, rubs or gallops Extremities- {EDEMA LEVEL:28147::"No"} peripheral edema, warm, dry Skin-  *** device pocket well-healed, no tethering   Device interrogation done today and reviewed by myself:  Battery *** Lead thresholds, impedence, sensing stable *** *** episodes *** changes made today  EKG {ACTION; IS/IS ZOX:09604540} ordered. Personal review of EKG from {Blank single:19197::"today","***"} shows:  ***        Recent Labs: 10/31/2022: TSH 2.13 01/30/2023: ALT 10; B Natriuretic Peptide 322.7 02/01/2023: Magnesium 2.1 02/05/2023: BUN 46; Creatinine, Ser 1.59; Platelets 176; Potassium 4.3; Sodium 136 02/06/2023: Hemoglobin 8.5  07/24/2022: Cholesterol 209; HDL 42.20; LDL Cholesterol 141; Total CHOL/HDL Ratio 5; Triglycerides 130.0; VLDL 26.0   Estimated Creatinine Clearance: 23.8 mL/min (A) (by C-G formula based on SCr of 1.59 mg/dL (H)).   Wt Readings from Last 3 Encounters:  02/06/23 139 lb 5.3 oz (63.2 kg)  01/30/23 139 lb 9.6 oz (63.3 kg)  01/18/23 134 lb 0.6 oz (60.8 kg)     Additional studies reviewed include: Previous EP, cardiology notes.     ASSESSMENT AND PLAN:  #) ***   #) ***   {Are you ordering a CV Procedure (e.g. stress test, cath, DCCV, TEE, etc)?   Press F2        :[id number]}   Current medicines are reviewed at length with the patient today.   The patient {ACTIONS; HAS/DOES NOT  HAVE:19233} concerns regarding her medicines.  The following changes were made today:  {NONE DEFAULTED:18576}  Labs/ tests ordered today include: *** No orders of the defined types were placed in this encounter.    Disposition: Follow up with {EPMDS:28135} or EP APP {EPFOLLOW UP:28173}   Signed, Sherie Don, NP  02/10/23  9:30 AM  Electrophysiology [facility]

## 2023-02-12 ENCOUNTER — Inpatient Hospital Stay: Payer: PPO | Admitting: Hematology

## 2023-02-12 ENCOUNTER — Other Ambulatory Visit: Payer: Self-pay | Admitting: Hematology

## 2023-02-12 ENCOUNTER — Inpatient Hospital Stay: Payer: PPO

## 2023-02-12 ENCOUNTER — Inpatient Hospital Stay: Payer: PPO | Attending: Nurse Practitioner

## 2023-02-12 ENCOUNTER — Encounter: Payer: Self-pay | Admitting: Hematology

## 2023-02-12 ENCOUNTER — Other Ambulatory Visit: Payer: Self-pay

## 2023-02-12 VITALS — BP 143/57 | HR 53 | Temp 98.3°F | Ht 66.0 in | Wt 132.1 lb

## 2023-02-12 VITALS — BP 129/56 | HR 60

## 2023-02-12 DIAGNOSIS — I082 Rheumatic disorders of both aortic and tricuspid valves: Secondary | ICD-10-CM | POA: Diagnosis not present

## 2023-02-12 DIAGNOSIS — Z95810 Presence of automatic (implantable) cardiac defibrillator: Secondary | ICD-10-CM | POA: Insufficient documentation

## 2023-02-12 DIAGNOSIS — D649 Anemia, unspecified: Secondary | ICD-10-CM

## 2023-02-12 DIAGNOSIS — K5520 Angiodysplasia of colon without hemorrhage: Secondary | ICD-10-CM

## 2023-02-12 DIAGNOSIS — R0602 Shortness of breath: Secondary | ICD-10-CM | POA: Diagnosis not present

## 2023-02-12 DIAGNOSIS — Z9981 Dependence on supplemental oxygen: Secondary | ICD-10-CM | POA: Diagnosis not present

## 2023-02-12 DIAGNOSIS — C7B8 Other secondary neuroendocrine tumors: Secondary | ICD-10-CM | POA: Insufficient documentation

## 2023-02-12 DIAGNOSIS — Z8616 Personal history of COVID-19: Secondary | ICD-10-CM | POA: Diagnosis not present

## 2023-02-12 DIAGNOSIS — N1832 Chronic kidney disease, stage 3b: Secondary | ICD-10-CM | POA: Insufficient documentation

## 2023-02-12 DIAGNOSIS — I5032 Chronic diastolic (congestive) heart failure: Secondary | ICD-10-CM | POA: Insufficient documentation

## 2023-02-12 DIAGNOSIS — D62 Acute posthemorrhagic anemia: Secondary | ICD-10-CM | POA: Diagnosis not present

## 2023-02-12 DIAGNOSIS — I130 Hypertensive heart and chronic kidney disease with heart failure and stage 1 through stage 4 chronic kidney disease, or unspecified chronic kidney disease: Secondary | ICD-10-CM | POA: Insufficient documentation

## 2023-02-12 DIAGNOSIS — I272 Pulmonary hypertension, unspecified: Secondary | ICD-10-CM | POA: Insufficient documentation

## 2023-02-12 DIAGNOSIS — Z79899 Other long term (current) drug therapy: Secondary | ICD-10-CM | POA: Diagnosis not present

## 2023-02-12 DIAGNOSIS — D631 Anemia in chronic kidney disease: Secondary | ICD-10-CM | POA: Diagnosis not present

## 2023-02-12 DIAGNOSIS — Z66 Do not resuscitate: Secondary | ICD-10-CM | POA: Insufficient documentation

## 2023-02-12 DIAGNOSIS — I428 Other cardiomyopathies: Secondary | ICD-10-CM | POA: Insufficient documentation

## 2023-02-12 DIAGNOSIS — I4821 Permanent atrial fibrillation: Secondary | ICD-10-CM | POA: Diagnosis not present

## 2023-02-12 DIAGNOSIS — D3A8 Other benign neuroendocrine tumors: Secondary | ICD-10-CM | POA: Diagnosis not present

## 2023-02-12 DIAGNOSIS — J449 Chronic obstructive pulmonary disease, unspecified: Secondary | ICD-10-CM | POA: Insufficient documentation

## 2023-02-12 DIAGNOSIS — K921 Melena: Secondary | ICD-10-CM | POA: Diagnosis not present

## 2023-02-12 DIAGNOSIS — N184 Chronic kidney disease, stage 4 (severe): Secondary | ICD-10-CM | POA: Insufficient documentation

## 2023-02-12 DIAGNOSIS — Z8719 Personal history of other diseases of the digestive system: Secondary | ICD-10-CM

## 2023-02-12 LAB — CBC WITH DIFFERENTIAL (CANCER CENTER ONLY)
Abs Immature Granulocytes: 0.01 10*3/uL (ref 0.00–0.07)
Basophils Absolute: 0 10*3/uL (ref 0.0–0.1)
Basophils Relative: 0 %
Eosinophils Absolute: 0 10*3/uL (ref 0.0–0.5)
Eosinophils Relative: 0 %
HCT: 27.4 % — ABNORMAL LOW (ref 36.0–46.0)
Hemoglobin: 8 g/dL — ABNORMAL LOW (ref 12.0–15.0)
Immature Granulocytes: 0 %
Lymphocytes Relative: 6 %
Lymphs Abs: 0.3 10*3/uL — ABNORMAL LOW (ref 0.7–4.0)
MCH: 28.8 pg (ref 26.0–34.0)
MCHC: 29.2 g/dL — ABNORMAL LOW (ref 30.0–36.0)
MCV: 98.6 fL (ref 80.0–100.0)
Monocytes Absolute: 0.4 10*3/uL (ref 0.1–1.0)
Monocytes Relative: 8 %
Neutro Abs: 3.9 10*3/uL (ref 1.7–7.7)
Neutrophils Relative %: 86 %
Platelet Count: 147 10*3/uL — ABNORMAL LOW (ref 150–400)
RBC: 2.78 MIL/uL — ABNORMAL LOW (ref 3.87–5.11)
RDW: 19.4 % — ABNORMAL HIGH (ref 11.5–15.5)
WBC Count: 4.5 10*3/uL (ref 4.0–10.5)
nRBC: 0 % (ref 0.0–0.2)

## 2023-02-12 LAB — IRON AND IRON BINDING CAPACITY (CC-WL,HP ONLY)
Iron: 45 ug/dL (ref 28–170)
Saturation Ratios: 13 % (ref 10.4–31.8)
TIBC: 335 ug/dL (ref 250–450)
UIBC: 290 ug/dL (ref 148–442)

## 2023-02-12 LAB — CMP (CANCER CENTER ONLY)
ALT: 8 U/L (ref 0–44)
AST: 16 U/L (ref 15–41)
Albumin: 4.1 g/dL (ref 3.5–5.0)
Alkaline Phosphatase: 53 U/L (ref 38–126)
Anion gap: 6 (ref 5–15)
BUN: 35 mg/dL — ABNORMAL HIGH (ref 8–23)
CO2: 32 mmol/L (ref 22–32)
Calcium: 9.2 mg/dL (ref 8.9–10.3)
Chloride: 100 mmol/L (ref 98–111)
Creatinine: 1.53 mg/dL — ABNORMAL HIGH (ref 0.44–1.00)
GFR, Estimated: 33 mL/min — ABNORMAL LOW (ref >60)
Glucose, Bld: 105 mg/dL — ABNORMAL HIGH (ref 70–99)
Potassium: 4.3 mmol/L (ref 3.5–5.1)
Sodium: 138 mmol/L (ref 135–145)
Total Bilirubin: 1.2 mg/dL (ref 0.3–1.2)
Total Protein: 7.6 g/dL (ref 6.5–8.1)

## 2023-02-12 LAB — SAMPLE TO BLOOD BANK

## 2023-02-12 LAB — FERRITIN: Ferritin: 61 ng/mL (ref 11–307)

## 2023-02-12 MED ORDER — OCTREOTIDE ACETATE 20 MG IM KIT
20.0000 mg | PACK | Freq: Once | INTRAMUSCULAR | Status: AC
  Administered 2023-02-12: 20 mg via INTRAMUSCULAR
  Filled 2023-02-12: qty 1

## 2023-02-12 MED ORDER — SODIUM CHLORIDE 0.9 % IV SOLN: Freq: Once | INTRAVENOUS | Status: AC

## 2023-02-12 MED ORDER — LIDOCAINE-PRILOCAINE 2.5-2.5 % EX CREA: TOPICAL_CREAM | CUTANEOUS | 3 refills | Status: DC

## 2023-02-12 MED ORDER — DOXYCYCLINE HYCLATE 100 MG PO TABS: 100.0000 mg | ORAL_TABLET | Freq: Two times a day (BID) | ORAL | 0 refills | Status: DC

## 2023-02-12 MED ORDER — SODIUM CHLORIDE 0.9 % IV SOLN
5.0000 mg/kg | Freq: Once | INTRAVENOUS | Status: AC
  Administered 2023-02-12: 300 mg via INTRAVENOUS
  Filled 2023-02-12: qty 12

## 2023-02-12 MED ORDER — EPOETIN ALFA-EPBX 20000 UNIT/ML IJ SOLN
20000.0000 [IU] | Freq: Once | INTRAMUSCULAR | Status: AC
  Administered 2023-02-12: 20000 [IU] via SUBCUTANEOUS
  Filled 2023-02-12: qty 1

## 2023-02-12 NOTE — Assessment & Plan Note (Signed)
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 -She previously had reaction to blood transfusion, or required multiple premedications for blood transfusion -She was recently hospitalized for anemia and a GI bleeding in July 2024. -She is currently on ESA every 2 weeks, IV iron as needed, and blood transfusion as needed. -Due to her multiple hospital admission for anemia and GI bleeding, I recommended her to try bevacizumab for AVM, benefit and potential side effects, especially hypertension, proteinuria, small risk of thrombosis including stroke and heart attack, bowel perforation, were discussed with her in detail, she agrees to proceed.  Plan to give every 2 weeks for 4 doses.

## 2023-02-12 NOTE — Assessment & Plan Note (Signed)
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   -Outpatient EUS 03/04/2022 by Dr. 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 Freida Busman did not recommend surgery in multifocal NET and also due to her age and co-morbidities. -She began first line sandostatin injections q28 days on 03/27/22.  -Dotatate PET scan, done 2 weeks after first injection 12/6, shows no significant tracer avidity in the pancrease or distant sites. Findings are likely due to close timing relative to the sandostatin injection blocking receptors. When we repeat this for restaging next year, will arrange 4 weeks after sandostatin -She has persistent anemia, likely secondary to GI bleeding from the tumor and AVM, she has had multiple hospital admission for anemia and bleeding.

## 2023-02-12 NOTE — Patient Instructions (Signed)
[facility]  Discharge Instructions: Thank you for choosing [facility] to provide your oncology and hematology care.   If you have a lab appointment with the Cancer Center, please go directly to the Cancer Center and check in at the registration area.   Wear comfortable clothing and clothing appropriate for easy access to any Portacath or PICC line.   We strive to give you quality time with your provider. You may need to reschedule your appointment if you arrive late (15 or more minutes).  Arriving late affects you and other patients whose appointments are after yours.  Also, if you miss three or more appointments without notifying the office, you may be dismissed from the clinic at the provider's discretion.      For prescription refill requests, have your pharmacy contact our office and allow 72 hours for refills to be completed.    Today you received the following chemotherapy and/or immunotherapy agents: Bevacizumab      To help prevent nausea and vomiting after your treatment, we encourage you to take your nausea medication as directed.  BELOW ARE SYMPTOMS THAT SHOULD BE REPORTED IMMEDIATELY: *FEVER GREATER THAN 100.4 F (38 C) OR HIGHER *CHILLS OR SWEATING *NAUSEA AND VOMITING THAT IS NOT CONTROLLED WITH YOUR NAUSEA MEDICATION *UNUSUAL SHORTNESS OF BREATH *UNUSUAL BRUISING OR BLEEDING *URINARY PROBLEMS (pain or burning when urinating, or frequent urination) *BOWEL PROBLEMS (unusual diarrhea, constipation, pain near the anus) TENDERNESS IN MOUTH AND THROAT WITH OR WITHOUT PRESENCE OF ULCERS (sore throat, sores in mouth, or a toothache) UNUSUAL RASH, SWELLING OR PAIN  UNUSUAL VAGINAL DISCHARGE OR ITCHING   Items with * indicate a potential emergency and should be followed up as soon as possible or go to the Emergency Department if any problems should occur.  Please show the CHEMOTHERAPY ALERT CARD or IMMUNOTHERAPY ALERT CARD at  check-in to the Emergency Department and triage nurse.  Should you have questions after your visit or need to cancel or reschedule your appointment, please contact [facility]  Dept: [phone]  and follow the prompts.  Office hours are 8:00 a.m. to 4:30 p.m. Monday - Friday. Please note that voicemails left after 4:00 p.m. may not be returned until the following business day.  We are closed weekends and major holidays. You have access to a nurse at all times for urgent questions. Please call the main number to the clinic Dept: [phone] and follow the prompts.   For any non-urgent questions, you may also contact your provider using MyChart. We now offer e-Visits for anyone 61 and older to request care online for non-urgent symptoms. For details visit [email or web address].   Also download the MyChart app! Go to the app store, search "MyChart", open the app, select [facility], and log in with your MyChart username and password.  Bevacizumab Injection What is this medication? BEVACIZUMAB (be va SIZ yoo mab) treats some types of cancer. It works by blocking a protein that causes cancer cells to grow and multiply. This helps to slow or stop the spread of cancer cells. It is a monoclonal antibody. This medicine may be used for other purposes; ask your health care provider or pharmacist if you have questions. COMMON BRAND NAME(S): Alymsys, Avastin, MVASI, Omer Jack What should I tell my care team before I take this medication? They need to know if you have any of these conditions: Blood clots Coughing up blood Having or recent surgery Heart  failure High blood pressure History of a connection between 2 or more body parts that do not usually connect (fistula) History of a tear in your stomach or intestines Protein in your urine An unusual or allergic reaction to bevacizumab, other medications, foods, dyes, or preservatives Pregnant or trying to get  pregnant Breast-feeding How should I use this medication? This medication is injected into a vein. It is given by your care team in a hospital or clinic setting. Talk to your care team the use of this medication in children. Special care may be needed. Overdosage: If you think you have taken too much of this medicine contact a poison control center or emergency room at once. NOTE: This medicine is only for you. Do not share this medicine with others. What if I miss a dose? Keep appointments for follow-up doses. It is important not to miss your dose. Call your care team if you are unable to keep an appointment. What may interact with this medication? Interactions are not expected. This list may not describe all possible interactions. Give your health care provider a list of all the medicines, herbs, non-prescription drugs, or dietary supplements you use. Also tell them if you smoke, drink alcohol, or use illegal drugs. Some items may interact with your medicine. What should I watch for while using this medication? Your condition will be monitored carefully while you are receiving this medication. You may need blood work while taking this medication. This medication may make you feel generally unwell. This is not uncommon as chemotherapy can affect healthy cells as well as cancer cells. Report any side effects. Continue your course of treatment even though you feel ill unless your care team tells you to stop. This medication may increase your risk to bruise or bleed. Call your care team if you notice any unusual bleeding. Before having surgery, talk to your care team to make sure it is ok. This medication can increase the risk of poor healing of your surgical site or wound. You will need to stop this medication for 28 days before surgery. After surgery, wait at least 28 days before restarting this medication. Make sure the surgical site or wound is healed enough before restarting this medication. Talk  to your care team if questions. Talk to your care team if you may be pregnant. Serious birth defects can occur if you take this medication during pregnancy and for 6 months after the last dose. Contraception is recommended while taking this medication and for 6 months after the last dose. Your care team can help you find the option that works for you. Do not breastfeed while taking this medication and for 6 months after the last dose. This medication can cause infertility. Talk to your care team if you are concerned about your fertility. What side effects may I notice from receiving this medication? Side effects that you should report to your care team as soon as possible: Allergic reactions--skin rash, itching, hives, swelling of the face, lips, tongue, or throat Bleeding--bloody or black, tar-like stools, vomiting blood or brown material that looks like coffee grounds, red or dark brown urine, small red or purple spots on skin, unusual bruising or bleeding Blood clot--pain, swelling, or warmth in the leg, shortness of breath, chest pain Heart attack--pain or tightness in the chest, shoulders, arms, or jaw, nausea, shortness of breath, cold or clammy skin, feeling faint or lightheaded Heart failure--shortness of breath, swelling of the ankles, feet, or hands, sudden weight gain, unusual weakness or  fatigue Increase in blood pressure Infection--fever, chills, cough, sore throat, wounds that don't heal, pain or trouble when passing urine, general feeling of discomfort or being unwell Infusion reactions--chest pain, shortness of breath or trouble breathing, feeling faint or lightheaded Kidney injury--decrease in the amount of urine, swelling of the ankles, hands, or feet Stomach pain that is severe, does not go away, or gets worse Stroke--sudden numbness or weakness of the face, arm, or leg, trouble speaking, confusion, trouble walking, loss of balance or coordination, dizziness, severe headache,  change in vision Sudden and severe headache, confusion, change in vision, seizures, which may be signs of posterior reversible encephalopathy syndrome (PRES) Side effects that usually do not require medical attention (report to your care team if they continue or are bothersome): Back pain Change in taste Diarrhea Dry skin Increased tears Nosebleed This list may not describe all possible side effects. Call your doctor for medical advice about side effects. You may report side effects to FDA at 1-800-FDA-1088. Where should I keep my medication? This medication is given in a hospital or clinic. It will not be stored at home. NOTE: This sheet is a summary. It may not cover all possible information. If you have questions about this medicine, talk to your doctor, pharmacist, or health care provider.  2024 [employer]/Gold Standard (2021-09-07 00:00:00)

## 2023-02-12 NOTE — Progress Notes (Signed)
[facility]   [phone] Fax:([phone]   Clinic Follow up Note   Patient Care Team: [name], MD as PCP - General (Family Medicine) Duke Salvia, MD as PCP - Cardiology (Cardiology) Laurey Morale, MD as PCP - Advanced Heart Failure (Cardiology) Antonieta Iba, MD as Consulting Physician (Cardiology) Duke Salvia, MD as Consulting Physician (Cardiology) [name], Latina Craver, MD as Consulting Physician (Vascular Surgery) Creig Hines, MD as Consulting Physician (Hematology and Oncology) Malachy Mood, MD as Consulting Physician (Oncology) Kathyrn Sheriff, [facility] (Inactive) as Pharmacist (Pharmacist)  Date of Service:  02/12/2023  CHIEF COMPLAINT: f/u of anemia and neuroendocrine tumor  CURRENT THERAPY:  Sandostatin injection every 4 weeks IV iron as needed Blood transfusion as needed Bevacizumab every 2 weeksX4 starting 02/12/2023  Oncology History   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   -Outpatient EUS 03/04/2022 by Dr. 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 Freida Busman did not recommend surgery in multifocal NET and also due to her age and co-morbidities. -She began first line sandostatin injections q28 days on 03/27/22.  -Dotatate PET scan, done 2 weeks after first injection 12/6, shows no significant tracer avidity in the pancrease or distant sites. Findings are likely due to close timing relative to the sandostatin injection blocking receptors. When we  repeat this for restaging next year, will arrange 4 weeks after sandostatin -She has persistent anemia, likely secondary to GI bleeding from the tumor and AVM, she has had multiple hospital admission for anemia and bleeding.     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 -She previously had reaction to blood transfusion, or required multiple premedications for blood transfusion -She was recently hospitalized for anemia and a GI bleeding in July 2024. -She is currently on ESA every 2 weeks, IV iron as needed, and blood transfusion as needed. -Due to her multiple hospital admission for anemia and GI bleeding, I recommended her to try bevacizumab for AVM, benefit and potential side effects, especially hypertension, proteinuria, small risk of thrombosis including stroke and heart attack, bowel perforation, were discussed with her in detail, she agrees to proceed.  Plan to give every 2 weeks for 4 doses.   Assessment and Plan    Metastatic Neuroendocrine Tumor Stable disease with ongoing anemia likely secondary to tumor bleeding. Hemoglobin decreased slightly from 8.5 to 8.0. Discussed the risks/benefits of starting Bevacizumab (hypertension, proteinuria, thromboembolic events / potential to reduce tumor bleeding and improve hemoglobin). -Start Bevacizumab IV every other week for 4 treatments. -Continue monthly Sandostatin injection.  Iron deficient anemia from GI bleeding and anemia of chronic disease -Continue Retacrit every 2 weeks if hemoglobin less than 10.5 -Continue IV iron if ferritin less than 20 -Check hemoglobin weekly to monitor  for need of blood transfusion.  She needs premedication  for blood transfusion  Sinus Infection Reports symptoms of sinus infection including drainage and cough. No fever reported. -Start Doxycycline for 7 days. -Check-in with primary care provider next week.  General Health Maintenance / Followup Plans -Check iron levels to assess need for IV iron. -Schedule potential blood transfusions every other week, to be used as needed based on hemoglobin levels. -Follow-up in clinic weekly for hemoglobin check and potential blood transfusion. -Follow-up with provider every other week for Bevacizumab infusion and injections.      Plan -Lab reviewed, will proceed Retacrit, Sandostatin injections today, and start bevacizumab today -Will schedule lab every week, blood transfusion every other week, follow-up and bevacizumab every 2 weeks  Discussed the use of AI scribe software for clinical note transcription with the patient, who gave verbal consent to proceed.  History of Present Illness   The patient, an 89 year old female with a history of metastatic neuroendocrine tumor and anemia, presents for a follow-up visit. She reports a current sinus infection, characterized by severe drainage and a cough for the past five days. She had a similar infection about two and a half weeks ago, which was successfully treated with antibiotics. She denies fever but notes she has been taking Tylenol for foot pain. She also reports a skin infection on her foot, which is painful and appears to be producing pus. She has been cleaning it daily with soap and water.  The patient's anemia appears to be ongoing, with a recent hemoglobin level of 8, slightly lower than the 8.5 recorded at her last hospital visit. She denies any visible bleeding or black stools but acknowledges that she may still be experiencing internal bleeding. She reports feeling a bit stronger since her last visit.  The patient's metastatic neuroendocrine tumor is being managed with regular injections and  infusions. She is due for her monthly injection and will also receive a new infusion treatment, bevacizumab, which is expected to shrink blood vessels and potentially slow down or stop the bleeding associated with her anemia.         All other systems were reviewed with the patient and are negative.  MEDICAL HISTORY:  Past Medical History:  Diagnosis Date   (HFpEF) heart failure with preserved ejection fraction (HCC)    EF=60-60%   Actinic keratosis 01/17/2015   R forearm   Adjustment disorder with anxiety    Adverse effect of other narcotics, sequela    Intolerance to all narcotics   Anti-Duffy antibodies present    Aortic valve stenosis    Arthritis    "some in my hands" (11/11/2012)   Atrial fibrillation, permanent (HCC)    Eliquis   Atypical mole 03/25/2018   L forearm - severe   Automatic implantable cardioverter-defibrillator in situ    Avascular necrosis of hip (HCC) 05/03/2011   Carotid artery stenosis 09/2007   a. 09/2007: 60-79% bilateral (stable); b. 10/2008: 40-59% R 60-79%    Cellulitis of left lower extremity 07/13/2020   CKD (chronic kidney disease), stage III (HCC)    COPD (chronic obstructive pulmonary disease) (HCC)    Coronary artery disease    non-obstructive by 2006 cath   COVID-19 virus infection 02/12/2022   Displaced fracture of left femoral neck (HCC) 10/09/2018   GI bleed 03/28/2020   AVM   High cholesterol    Hypertension 05/20/2011   Hypertr obst cardiomyop    Hypotension, unspecified    cardiac cath 2006..nonobstructive CAD 30-40s lesions.Marland KitchenETT  1/09 nondiagnostic due to poor HR response..Right Renal Cancer 2003   Iron deficiency anemia    Long term (current) use of anticoagulants    On home oxygen therapy    3L [place]   Osteoarthritis of right hip    PAD (peripheral artery disease) (HCC)    Pancreatic cancer (HCC)    sandostatin   PONV (postoperative nausea and vomiting)    Presence of permanent cardiac pacemaker    Pulmonary HTN (HCC)     RECTAL BLEEDING 10/13/2009   Qualifier: Diagnosis of  By: Wilmon Pali NP, Paula     Red blood cell antibody positive with compatible PRBC difficult to obtain    Anti FYA (Duffy a) antibody. Must be transfused with PRB which are Duffy Antigen Negative and Crossmatch Compatible   Renal cell carcinoma (HCC)    s/p nephrectomy   Squamous cell carcinoma of skin 01/17/2015   R lat wrist   Squamous cell carcinoma of skin 01/29/2018   R post upper leg - superficially invasive   Squamous cell carcinoma of skin 03/25/2018   L lat foot   Squamous cell carcinoma of skin 11/09/2020   left lat foot - EDC 01/01/21, recurrent 01/31/21 - MOHs 02/22/21   Squamous cell carcinoma of skin 12/19/2021   Right Posterior Medial Thigh, EDC   Squamous cell carcinoma of skin 12/19/2021   SCC IS, L lat heel, EDC 02/04/2022   Squamous cell carcinoma of skin 01/21/2022   L forearm, EDC 02/04/2022   Squamous cell carcinoma of skin 01/21/2022   R lower leg below knee, EDC 02/04/2022   Squamous cell carcinoma of skin 01/21/2022   SCCIS, R post heel, EDC 02/04/2022   Squamous cell carcinoma of skin 07/01/2022   left lower abdomen, in situ, EDC   Squamous cell carcinoma of skin 07/01/2022   left medial chest, EDC   Urge incontinence    Venous stasis of both lower extremities     SURGICAL HISTORY: Past Surgical History:  Procedure Laterality Date   ABDOMINAL AORTOGRAM W/LOWER EXTREMITY N/A 02/12/2021   Procedure: ABDOMINAL AORTOGRAM W/LOWER EXTREMITY;  Surgeon: Maeola Harman, MD;  Location: [facility] INVASIVE CV LAB;  Service: Cardiovascular;  Laterality: N/A;   ABDOMINAL HYSTERECTOMY  1975   for benign causes   APPENDECTOMY     BI-VENTRICULAR PACEMAKER UPGRADE  05/04/2010   BIOPSY  02/28/2019   Procedure: BIOPSY;  Surgeon: Rachael Fee, MD;  Location: [facility] ENDOSCOPY;  Service: Endoscopy;;   BIOPSY  03/04/2022   Procedure: BIOPSY;  Surgeon: Lemar Lofty., MD;  Location: [facility] ENDOSCOPY;  Service:  Gastroenterology;;   CARDIAC CATHETERIZATION  2006   CARDIOVERSION N/A 02/20/2018   Procedure: CARDIOVERSION;  Surgeon: Antonieta Iba, MD;  Location: [facility] ORS;  Service: Cardiovascular;  Laterality: N/A;   CARDIOVERSION N/A 03/27/2018   Procedure: CARDIOVERSION;  Surgeon: Antonieta Iba, MD;  Location: [facility] ORS;  Service: Cardiovascular;  Laterality: N/A;   CATARACT EXTRACTION W/ INTRAOCULAR LENS  IMPLANT, BILATERAL  01/2006-02-2006   CHOLECYSTECTOMY N/A 11/11/2012   Procedure: LAPAROSCOPIC CHOLECYSTECTOMY WITH INTRAOPERATIVE CHOLANGIOGRAM;  Surgeon: Wilmon Arms. Corliss Skains, MD;  Location: [facility] OR;  Service: General;  Laterality: N/A;   COLONOSCOPY WITH PROPOFOL N/A 02/28/2019   Procedure: COLONOSCOPY WITH PROPOFOL;  Surgeon: Rachael Fee, MD;  Location: [facility] ENDOSCOPY;  Service: Endoscopy;  Laterality: N/A;   ENTEROSCOPY N/A 03/30/2020   Procedure: ENTEROSCOPY;  Surgeon: Shellia Cleverly, DO;  Location: [facility] ENDOSCOPY;  Service: Gastroenterology;  Laterality: N/A;   ENTEROSCOPY N/A  02/10/2021   Procedure: ENTEROSCOPY;  Surgeon: Jeani Hawking, MD;  Location: [facility] ENDOSCOPY;  Service: Endoscopy;  Laterality: N/A;   ENTEROSCOPY N/A 02/15/2022   Procedure: ENTEROSCOPY;  Surgeon: Imogene Burn, MD;  Location: [facility] ENDOSCOPY;  Service: Gastroenterology;  Laterality: N/A;   ENTEROSCOPY N/A 11/06/2022   Procedure: ENTEROSCOPY;  Surgeon: Meridee Score Netty Starring., MD;  Location: [facility] ENDOSCOPY;  Service: Gastroenterology;  Laterality: N/A;   EP IMPLANTABLE DEVICE N/A 02/21/2016   Procedure: ICD Generator Changeout;  Surgeon: Duke Salvia, MD;  Location: [facility] INVASIVE CV LAB;  Service: Cardiovascular;  Laterality: N/A;   ESOPHAGOGASTRODUODENOSCOPY N/A 03/04/2022   Procedure: ESOPHAGOGASTRODUODENOSCOPY (EGD);  Surgeon: Lemar Lofty., MD;  Location: [facility] ENDOSCOPY;  Service: Gastroenterology;  Laterality: N/A;   ESOPHAGOGASTRODUODENOSCOPY (EGD) WITH PROPOFOL N/A 02/28/2019   Procedure:  ESOPHAGOGASTRODUODENOSCOPY (EGD) WITH PROPOFOL;  Surgeon: Rachael Fee, MD;  Location: [facility] ENDOSCOPY;  Service: Endoscopy;  Laterality: N/A;   EUS N/A 03/04/2022   Procedure: UPPER ENDOSCOPIC ULTRASOUND (EUS) RADIAL;  Surgeon: Lemar Lofty., MD;  Location: [facility] ENDOSCOPY;  Service: Gastroenterology;  Laterality: N/A;   FINE NEEDLE ASPIRATION N/A 03/04/2022   Procedure: FINE NEEDLE ASPIRATION (FNA) LINEAR;  Surgeon: Lemar Lofty., MD;  Location: [facility] ENDOSCOPY;  Service: Gastroenterology;  Laterality: N/A;   GIVENS CAPSULE STUDY N/A 03/15/2019   Procedure: GIVENS CAPSULE STUDY;  Surgeon: Tressia Danas, MD;  Location: [facility] ENDOSCOPY;  Service: Gastroenterology;  Laterality: N/A;   HOT HEMOSTASIS N/A 03/30/2020   Procedure: HOT HEMOSTASIS (ARGON PLASMA COAGULATION/BICAP);  Surgeon: Shellia Cleverly, DO;  Location: [facility] ENDOSCOPY;  Service: Gastroenterology;  Laterality: N/A;   HOT HEMOSTASIS N/A 02/10/2021   Procedure: HOT HEMOSTASIS (ARGON PLASMA COAGULATION/BICAP);  Surgeon: Jeani Hawking, MD;  Location: [facility] ENDOSCOPY;  Service: Endoscopy;  Laterality: N/A;   HOT HEMOSTASIS N/A 02/15/2022   Procedure: HOT HEMOSTASIS (ARGON PLASMA COAGULATION/BICAP);  Surgeon: Imogene Burn, MD;  Location: [facility] ENDOSCOPY;  Service: Gastroenterology;  Laterality: N/A;   HOT HEMOSTASIS N/A 11/06/2022   Procedure: HOT HEMOSTASIS (ARGON PLASMA COAGULATION/BICAP);  Surgeon: Lemar Lofty., MD;  Location: [facility] ENDOSCOPY;  Service: Gastroenterology;  Laterality: N/A;   INSERT / REPLACE / REMOVE PACEMAKER  05-01-11   02-28-05-/05-04-10-ICD-MEDTRONIC MAXIMAL DR   IR ANGIOGRAM SELECTIVE EACH ADDITIONAL VESSEL  02/18/2022   IR ANGIOGRAM SELECTIVE EACH ADDITIONAL VESSEL  02/18/2022   IR ANGIOGRAM VISCERAL SELECTIVE  02/16/2022   IR ANGIOGRAM VISCERAL SELECTIVE  02/16/2022   IR EMBO ART  VEN HEMORR LYMPH EXTRAV  INC GUIDE ROADMAPPING  02/16/2022   IR THORACENTESIS ASP PLEURAL SPACE W/IMG GUIDE   02/21/2022   IR THORACENTESIS ASP PLEURAL SPACE W/IMG GUIDE  02/22/2022   IR US GUIDE VASC ACCESS RIGHT  02/16/2022   JOINT REPLACEMENT     LAPAROSCOPIC CHOLECYSTECTOMY  11/11/2012   LAPAROSCOPIC LYSIS OF ADHESIONS N/A 11/11/2012   Procedure: LAPAROSCOPIC LYSIS OF ADHESIONS;  Surgeon: Wilmon Arms. Corliss Skains, MD;  Location: [facility] OR;  Service: General;  Laterality: N/A;   NEPHRECTOMY Right 06/2001    S/P RENAL CELL CANCER   PERIPHERAL VASCULAR INTERVENTION Bilateral 02/12/2021   Procedure: PERIPHERAL VASCULAR INTERVENTION;  Surgeon: Maeola Harman, MD;  Location: [facility] INVASIVE CV LAB;  Service: Cardiovascular;  Laterality: Bilateral;  Iliac artery stents   PRESSURE SENSOR/CARDIOMEMS N/A 02/03/2019   Procedure: PRESSURE SENSOR/CARDIOMEMS;  Surgeon: Laurey Morale, MD;  Location: [facility] INVASIVE CV LAB;  Service: Cardiovascular;  Laterality: N/A;   RIGHT HEART CATH N/A 11/09/2018   Procedure: RIGHT HEART CATH;  Surgeon:  Laurey Morale, MD;  Location: [facility] INVASIVE CV LAB;  Service: Cardiovascular;  Laterality: N/A;   RIGHT HEART CATH N/A 03/08/2019   Procedure: RIGHT HEART CATH;  Surgeon: Laurey Morale, MD;  Location: [facility] INVASIVE CV LAB;  Service: Cardiovascular;  Laterality: N/A;   SUBMUCOSAL TATTOO INJECTION  02/28/2019   Procedure: SUBMUCOSAL TATTOO INJECTION;  Surgeon: Rachael Fee, MD;  Location: [facility] ENDOSCOPY;  Service: Endoscopy;;   SUBMUCOSAL TATTOO INJECTION  11/06/2022   Procedure: SUBMUCOSAL TATTOO INJECTION;  Surgeon: Lemar Lofty., MD;  Location: [facility] ENDOSCOPY;  Service: Gastroenterology;;   TOTAL HIP ARTHROPLASTY Right 05/03/2011   Procedure: TOTAL HIP ARTHROPLASTY ANTERIOR APPROACH;  Surgeon: Kathryne Hitch;  Location: [facility] ORS;  Service: Orthopedics;  Laterality: Right;  Removal of Cannulated Screws Right Hip, Right Direct Anterior Hip Replacement   TOTAL HIP ARTHROPLASTY Left 10/09/2018   Procedure: TOTAL HIP ARTHROPLASTY ANTERIOR APPROACH;  Surgeon: Samson Frederic, MD;   Location: [facility] OR;  Service: Orthopedics;  Laterality: Left;    I have reviewed the social history and family history with the patient and they are unchanged from previous note.  ALLERGIES:  is allergic to codeine, amoxil [amoxicillin], asa [aspirin], ms contin [morphine], neurontin [gabapentin], and nsaids.  MEDICATIONS:  Current Outpatient Medications  Medication Sig Dispense Refill   doxycycline (VIBRA-TABS) 100 MG tablet Take 1 tablet (100 mg total) by mouth 2 (two) times daily. 14 tablet 0   acetaminophen (TYLENOL) 500 MG tablet Take 1,000 mg by mouth daily.     Ascorbic Acid (VITAMIN C PO) Take 1 tablet by mouth daily.     Cholecalciferol (VITAMIN D-3 PO) Take 2 capsules by mouth daily.     Cyanocobalamin (B-12 PO) Take 1 tablet by mouth daily.     ferrous gluconate (FERGON) 324 MG tablet Take 1 tablet (324 mg total) by mouth daily with breakfast. 30 tablet 3   fluticasone (FLONASE) 50 MCG/ACT nasal spray Place 2 sprays into both nostrils daily as needed for allergies or rhinitis. 16 g 0   lidocaine-prilocaine (EMLA) cream Apply to affected area once 30 g 3   Multiple Vitamins-Minerals (HAIR SKIN NAILS PO) Take 1 capsule by mouth daily.     pantoprazole (PROTONIX) 40 MG tablet Take 1 tablet (40 mg total) by mouth daily. 30 tablet 3   potassium chloride SA (KLOR-CON M) 20 MEQ tablet Take 1 tablet (20 mEq total) by mouth daily.     rosuvastatin (CRESTOR) 40 MG tablet Take 1 tablet (40 mg total) by mouth daily. (Patient taking differently: Take 40 mg by mouth every evening.) 30 tablet 11   torsemide (DEMADEX) 20 MG tablet Take 5 tablets (100 mg total) by mouth 2 (two) times daily.     No current facility-administered medications for this visit.    PHYSICAL EXAMINATION: ECOG PERFORMANCE STATUS: 3 - Symptomatic, >50% confined to bed  Vitals:   02/12/23 1402  BP: (!) 143/57  Pulse: (!) 53  Temp: 98.3 F (36.8 C)  SpO2: 98%   Wt Readings from Last 3 Encounters:  02/12/23 132 lb  1.6 oz (59.9 kg)  02/06/23 139 lb 5.3 oz (63.2 kg)  01/30/23 139 lb 9.6 oz (63.3 kg)     GENERAL:alert, no distress and comfortable SKIN: skin color, texture, turgor are normal, no rashes or significant lesions EYES: normal, Conjunctiva are pink and non-injected, sclera clear NECK: supple, thyroid normal size, non-tender, without nodularity LYMPH:  no palpable lymphadenopathy in the cervical, axillary  LUNGS: clear to auscultation and  percussion with normal breathing effort HEART: regular rate & rhythm and no murmurs and no lower extremity edema ABDOMEN:abdomen soft, non-tender and normal bowel sounds Musculoskeletal:no cyanosis of digits and no clubbing  NEURO: alert & oriented x 3 with fluent speech, no focal motor/sensory deficits   LABORATORY DATA:  I have reviewed the data as listed    Latest Ref Rng & Units 02/12/2023    1:09 PM 02/06/2023   12:02 PM 02/05/2023    3:02 AM  CBC  WBC 4.0 - 10.5 K/uL 4.5   3.2   Hemoglobin 12.0 - 15.0 g/dL 8.0  8.5  8.1   Hematocrit 36.0 - 46.0 % 27.4  29.3  27.2   Platelets 150 - 400 K/uL 147   176         Latest Ref Rng & Units 02/12/2023    1:09 PM 02/05/2023    3:02 AM 02/04/2023    7:21 AM  CMP  Glucose 70 - 99 mg/dL 161  096  045   BUN 8 - 23 mg/dL 35  46  53   Creatinine 0.44 - 1.00 mg/dL 4.09  8.11  9.14   Sodium 135 - 145 mmol/L 138  136  138   Potassium 3.5 - 5.1 mmol/L 4.3  4.3  4.3   Chloride 98 - 111 mmol/L 100  100  101   CO2 22 - 32 mmol/L 32  29  29   Calcium 8.9 - 10.3 mg/dL 9.2  8.6  8.6   Total Protein 6.5 - 8.1 g/dL 7.6     Total Bilirubin 0.3 - 1.2 mg/dL 1.2     Alkaline Phos 38 - 126 U/L 53     AST 15 - 41 U/L 16     ALT 0 - 44 U/L 8         RADIOGRAPHIC STUDIES: I have personally reviewed the radiological images as listed and agreed with the findings in the report. No results found.    Orders Placed This Encounter  Procedures   Consent Attestation for Oncology Treatment    Order Specific Question:    The patient is informed of risks, benefits, side-effects of the prescribed oncology treatment. Potential short term and long term side effects and response rates discussed. After a long discussion, the patient made informed decision to proceed.    Answer:   Yes   PHYSICIAN COMMUNICATION ORDER    UA Protein, dipstick required prior to every 3rd cycle bevacizumab   All questions were answered. The patient knows to call the clinic with any problems, questions or concerns. No barriers to learning was detected. The total time spent in the appointment was 40 minutes.     Malachy Mood, MD 02/12/2023

## 2023-02-12 NOTE — Progress Notes (Signed)
OK to treat today with D1/C1 Bevacizumab without urine protein per Dr. Mosetta Putt

## 2023-02-13 ENCOUNTER — Telehealth: Payer: Self-pay

## 2023-02-13 LAB — CHROMOGRANIN A: Chromogranin A (ng/mL): 92.8 ng/mL (ref 0.0–101.8)

## 2023-02-13 NOTE — Telephone Encounter (Signed)
-----   Message from Nurse Karn Pickler sent at 02/12/2023  4:31 PM EDT ----- Regarding: Dr. Harold Barban 1st tx f/u call Dr. Mosetta Putt 1st tx f/u call - Bevacizumab 1st tx f/u call - tolerated well

## 2023-02-13 NOTE — Telephone Encounter (Signed)
Ms. Lesmeister states that she is doing fine. She is eating, drinking, and urinating well. She knows to call the office at [phone] if  she has any questions or concerns.

## 2023-02-19 ENCOUNTER — Other Ambulatory Visit: Payer: Self-pay

## 2023-02-19 ENCOUNTER — Inpatient Hospital Stay: Payer: PPO

## 2023-02-19 ENCOUNTER — Inpatient Hospital Stay: Payer: PPO | Admitting: Family Medicine

## 2023-02-19 DIAGNOSIS — I4821 Permanent atrial fibrillation: Secondary | ICD-10-CM | POA: Diagnosis not present

## 2023-02-19 DIAGNOSIS — D649 Anemia, unspecified: Secondary | ICD-10-CM

## 2023-02-19 DIAGNOSIS — D500 Iron deficiency anemia secondary to blood loss (chronic): Secondary | ICD-10-CM | POA: Diagnosis not present

## 2023-02-19 DIAGNOSIS — K5520 Angiodysplasia of colon without hemorrhage: Secondary | ICD-10-CM

## 2023-02-19 DIAGNOSIS — D3A8 Other benign neuroendocrine tumors: Secondary | ICD-10-CM | POA: Diagnosis not present

## 2023-02-19 DIAGNOSIS — I6523 Occlusion and stenosis of bilateral carotid arteries: Secondary | ICD-10-CM | POA: Diagnosis not present

## 2023-02-19 DIAGNOSIS — J9611 Chronic respiratory failure with hypoxia: Secondary | ICD-10-CM | POA: Diagnosis not present

## 2023-02-19 DIAGNOSIS — N1832 Chronic kidney disease, stage 3b: Secondary | ICD-10-CM | POA: Diagnosis not present

## 2023-02-19 DIAGNOSIS — I272 Pulmonary hypertension, unspecified: Secondary | ICD-10-CM | POA: Diagnosis not present

## 2023-02-19 DIAGNOSIS — I2510 Atherosclerotic heart disease of native coronary artery without angina pectoris: Secondary | ICD-10-CM | POA: Diagnosis not present

## 2023-02-19 DIAGNOSIS — J449 Chronic obstructive pulmonary disease, unspecified: Secondary | ICD-10-CM | POA: Diagnosis not present

## 2023-02-19 DIAGNOSIS — K922 Gastrointestinal hemorrhage, unspecified: Secondary | ICD-10-CM | POA: Diagnosis not present

## 2023-02-19 DIAGNOSIS — I5033 Acute on chronic diastolic (congestive) heart failure: Secondary | ICD-10-CM | POA: Diagnosis not present

## 2023-02-19 DIAGNOSIS — I130 Hypertensive heart and chronic kidney disease with heart failure and stage 1 through stage 4 chronic kidney disease, or unspecified chronic kidney disease: Secondary | ICD-10-CM | POA: Diagnosis not present

## 2023-02-19 LAB — CBC WITH DIFFERENTIAL (CANCER CENTER ONLY)
Abs Immature Granulocytes: 0.01 10*3/uL (ref 0.00–0.07)
Basophils Absolute: 0 10*3/uL (ref 0.0–0.1)
Basophils Relative: 0 %
Eosinophils Absolute: 0 10*3/uL (ref 0.0–0.5)
Eosinophils Relative: 0 %
HCT: 25.1 % — ABNORMAL LOW (ref 36.0–46.0)
Hemoglobin: 7.6 g/dL — ABNORMAL LOW (ref 12.0–15.0)
Immature Granulocytes: 0 %
Lymphocytes Relative: 15 %
Lymphs Abs: 0.4 10*3/uL — ABNORMAL LOW (ref 0.7–4.0)
MCH: 29 pg (ref 26.0–34.0)
MCHC: 30.3 g/dL (ref 30.0–36.0)
MCV: 95.8 fL (ref 80.0–100.0)
Monocytes Absolute: 0.4 10*3/uL (ref 0.1–1.0)
Monocytes Relative: 15 %
Neutro Abs: 1.8 10*3/uL (ref 1.7–7.7)
Neutrophils Relative %: 70 %
Platelet Count: 153 10*3/uL (ref 150–400)
RBC: 2.62 MIL/uL — ABNORMAL LOW (ref 3.87–5.11)
RDW: 19.1 % — ABNORMAL HIGH (ref 11.5–15.5)
WBC Count: 2.5 10*3/uL — ABNORMAL LOW (ref 4.0–10.5)
nRBC: 0 % (ref 0.0–0.2)

## 2023-02-19 LAB — SAMPLE TO BLOOD BANK

## 2023-02-19 LAB — CMP (CANCER CENTER ONLY)
ALT: 7 U/L (ref 0–44)
AST: 13 U/L — ABNORMAL LOW (ref 15–41)
Albumin: 3.9 g/dL (ref 3.5–5.0)
Alkaline Phosphatase: 51 U/L (ref 38–126)
Anion gap: 9 (ref 5–15)
BUN: 53 mg/dL — ABNORMAL HIGH (ref 8–23)
CO2: 30 mmol/L (ref 22–32)
Calcium: 9.2 mg/dL (ref 8.9–10.3)
Chloride: 99 mmol/L (ref 98–111)
Creatinine: 1.55 mg/dL — ABNORMAL HIGH (ref 0.44–1.00)
GFR, Estimated: 32 mL/min — ABNORMAL LOW (ref >60)
Glucose, Bld: 105 mg/dL — ABNORMAL HIGH (ref 70–99)
Potassium: 4.1 mmol/L (ref 3.5–5.1)
Sodium: 138 mmol/L (ref 135–145)
Total Bilirubin: 0.8 mg/dL (ref 0.3–1.2)
Total Protein: 7.3 g/dL (ref 6.5–8.1)

## 2023-02-19 LAB — PREPARE RBC (CROSSMATCH)

## 2023-02-20 ENCOUNTER — Inpatient Hospital Stay: Payer: PPO

## 2023-02-20 ENCOUNTER — Encounter (HOSPITAL_COMMUNITY): Payer: PPO

## 2023-02-20 DIAGNOSIS — K5520 Angiodysplasia of colon without hemorrhage: Secondary | ICD-10-CM

## 2023-02-20 DIAGNOSIS — D649 Anemia, unspecified: Secondary | ICD-10-CM

## 2023-02-20 DIAGNOSIS — N1832 Chronic kidney disease, stage 3b: Secondary | ICD-10-CM | POA: Diagnosis not present

## 2023-02-20 MED ORDER — ACETAMINOPHEN 325 MG PO TABS
650.0000 mg | ORAL_TABLET | ORAL | Status: AC
  Administered 2023-02-20: 650 mg via ORAL
  Filled 2023-02-20: qty 2

## 2023-02-20 MED ORDER — FAMOTIDINE IN NACL 20-0.9 MG/50ML-% IV SOLN
20.0000 mg | Freq: Once | INTRAVENOUS | Status: AC
  Administered 2023-02-20: 20 mg via INTRAVENOUS
  Filled 2023-02-20: qty 50

## 2023-02-20 MED ORDER — METHYLPREDNISOLONE SODIUM SUCC 125 MG IJ SOLR
125.0000 mg | Freq: Once | INTRAMUSCULAR | Status: AC
  Administered 2023-02-20: 125 mg via INTRAVENOUS
  Filled 2023-02-20: qty 2

## 2023-02-20 MED ORDER — DIPHENHYDRAMINE HCL 25 MG PO CAPS
25.0000 mg | ORAL_CAPSULE | Freq: Once | ORAL | Status: AC
  Administered 2023-02-20: 25 mg via ORAL
  Filled 2023-02-20: qty 1

## 2023-02-20 MED ORDER — SODIUM CHLORIDE 0.9% IV SOLUTION
250.0000 mL | Freq: Once | INTRAVENOUS | Status: AC
  Administered 2023-02-20: 100 mL via INTRAVENOUS

## 2023-02-20 MED ORDER — FUROSEMIDE 10 MG/ML IJ SOLN
20.0000 mg | Freq: Once | INTRAMUSCULAR | Status: AC
  Administered 2023-02-20: 20 mg via INTRAVENOUS
  Filled 2023-02-20: qty 2

## 2023-02-20 NOTE — Patient Instructions (Signed)

## 2023-02-21 LAB — TYPE AND SCREEN
ABO/RH(D): A NEG
Antibody Screen: POSITIVE
Unit division: 0

## 2023-02-21 LAB — BPAM RBC
Blood Product Expiration Date: 202411102359
ISSUE DATE / TIME: 202410171421
Unit Type and Rh: 600

## 2023-02-24 ENCOUNTER — Ambulatory Visit: Payer: Self-pay

## 2023-02-24 ENCOUNTER — Encounter: Payer: Self-pay | Admitting: Family Medicine

## 2023-02-24 ENCOUNTER — Ambulatory Visit (INDEPENDENT_AMBULATORY_CARE_PROVIDER_SITE_OTHER): Payer: PPO | Admitting: Family Medicine

## 2023-02-24 VITALS — BP 130/62 | HR 62 | Temp 97.9°F | Ht 66.0 in | Wt 132.4 lb

## 2023-02-24 DIAGNOSIS — D500 Iron deficiency anemia secondary to blood loss (chronic): Secondary | ICD-10-CM | POA: Diagnosis not present

## 2023-02-24 DIAGNOSIS — Z87898 Personal history of other specified conditions: Secondary | ICD-10-CM

## 2023-02-24 DIAGNOSIS — Z9981 Dependence on supplemental oxygen: Secondary | ICD-10-CM

## 2023-02-24 DIAGNOSIS — Z66 Do not resuscitate: Secondary | ICD-10-CM

## 2023-02-24 DIAGNOSIS — I5032 Chronic diastolic (congestive) heart failure: Secondary | ICD-10-CM | POA: Diagnosis not present

## 2023-02-24 DIAGNOSIS — D3A8 Other benign neuroendocrine tumors: Secondary | ICD-10-CM

## 2023-02-24 DIAGNOSIS — J9611 Chronic respiratory failure with hypoxia: Secondary | ICD-10-CM | POA: Diagnosis not present

## 2023-02-24 DIAGNOSIS — Z95810 Presence of automatic (implantable) cardiac defibrillator: Secondary | ICD-10-CM

## 2023-02-24 DIAGNOSIS — I739 Peripheral vascular disease, unspecified: Secondary | ICD-10-CM

## 2023-02-24 DIAGNOSIS — J439 Emphysema, unspecified: Secondary | ICD-10-CM

## 2023-02-24 DIAGNOSIS — I4821 Permanent atrial fibrillation: Secondary | ICD-10-CM | POA: Diagnosis not present

## 2023-02-24 DIAGNOSIS — D649 Anemia, unspecified: Secondary | ICD-10-CM | POA: Diagnosis not present

## 2023-02-24 DIAGNOSIS — Z905 Acquired absence of kidney: Secondary | ICD-10-CM

## 2023-02-24 DIAGNOSIS — I5033 Acute on chronic diastolic (congestive) heart failure: Secondary | ICD-10-CM

## 2023-02-24 DIAGNOSIS — Z515 Encounter for palliative care: Secondary | ICD-10-CM | POA: Diagnosis not present

## 2023-02-24 DIAGNOSIS — N1832 Chronic kidney disease, stage 3b: Secondary | ICD-10-CM

## 2023-02-24 NOTE — Progress Notes (Addendum)
Ph: [phone] Fax: [phone]   Patient ID: [name], female    DOB: 1933-12-10, 89 y.o.   MRN: [id number]  This visit was conducted in person.  BP 130/62   Pulse 62   Temp 97.9 F (36.6 C) (Oral)   Ht 5\' 6"  (1.676 m)   Wt 132 lb 6 oz (60 kg)   SpO2 98% Comment: 2 L  BMI 21.37 kg/m    CC: hosp f/u visit  Subjective:   HPI: [name] is a 89 y.o. female presenting on 02/24/2023 for Hospitalization Follow-up (Admitted on 01/30/23 at [facility], dx melena; gastrointestinal hemorrhage; angiodysplasia of small intestine. Pt accompanied by niece, [name]. )   Pleasant 76 year old with chronic diastolic heart failure with right ventricular failure latest echo showing ejection fraction of 65 to 70% last month, status post permanent pacemaker AICD, pulmonary hypertension, permanent atrial fibrillation not anticoagulated due to chronic GI bleeding/AVMs, chronic anemia with antibody/history of transfusion reactions, COPD and interstitial lung disease, chronic hypoxic respiratory failure on 2 L of oxygen via nasal cannula, pancreatic cancer, kidney disease stage IIIb, renal cell cancer status post right nephrectomy, history of TIA.  Recent hospitalization for recurrent symptomatic GI bleed, presented from heart failure clinic. Presented with exertional dyspnea, lower extremity edema despite torsemide 100mg  bid.  Hospital records reviewed. Med rec performed.  s/p total 3u pRBC, IV iron x2 and diuresis during this hospitalization.  Hgb stable at 8 upon discharge.  Plan for conservative management, no further invasive procedures indicated per GI recommendations (high risk due to RV failure).   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   Last blood transfusion was Thursday 02/20/2023 through cancer center, received 1u pRBC.   She checks weights daily.  She is using [facility] unit at home.   Home health set up as well.  Other  follow up appointments scheduled: palliative care - appt tomorrow. Saw oncology Dr Mosetta Putt 02/12/2023 for pancreatic neuroendocrine tumor - started on bevacizumab IV QO week x4 treatments, continues monthly sandostatin injections with plan for weekly labwork, Retacrit as well, blood transfusions every other week as needed.  CHF clinic at end of October.   ADDENDUM ==>  Ordering afflovest therapy due to airway clearance tried/failed with acapella/flutter/pep did not mobilize secretions with daily productive cough for at least 6 months" due to bronchiectasis noted on CT scan dated -12/09/22  and 01/30/23.  Ordering AVAPS-AE for ACRF with COPD. The addition of the auto-titrating feature is required for this patient to decrease the PaCO2 more efficiently and rapidly due to the severity of his condition. Volume Ventilation is indicated.  Ordering NIV with AVAPS-AE for use during hours of sleep as well as during waking hours when symptomatic. Ordering NIV with AVAPS-AE as opposed to BiPAP S/T or BiPAP-AVAPS as patient requires advanced pressure ranges to achieve consistent and reliable volumes. As COPD is not a nocturnal disorder, but rather one that affects ventilation at all hours of the day, this patient would benefit from a ventilatory device with portability and the option to use for exertional distress during the day. BiPAP and RAD devices cannot provide these necessary features.  ______________________________________________________________________ Hospital admission: 01/30/2023 Hospital discharge: 02/06/2023 TCM f/u phone call: - too late OV for TCM visit  Recommendations at discharge:   Patient will continue diuresis at home with torsemide 100 mg po bid. Close follow up Hgb and Hct as outpatient, may need serial PRBC transfusions.  Follow up with Dr  Raynah Gomes in 7 to 10 days.  Follow up renal function and electrolytes in 7 days.  Follow up with Hematology/ Oncology as scheduled.    Discharge  Diagnoses: Principal Problem:   Symptomatic anemia Active Problems:   Acute on chronic diastolic CHF (congestive heart failure) (HCC)   CKD stage 3b, GFR 30-44 ml/min (HCC)   COPD (chronic obstructive pulmonary disease) (HCC)   Permanent atrial fibrillation   Primary pancreatic neuroendocrine tumor   DNR (do not resuscitate)   TIA (transient ischemic attack)     Relevant past medical, surgical, family and social history reviewed and updated as indicated. Interim medical history since our last visit reviewed. Allergies and medications reviewed and updated. Outpatient Medications Prior to Visit  Medication Sig Dispense Refill   acetaminophen (TYLENOL) 500 MG tablet Take 1,000 mg by mouth daily.     Ascorbic Acid (VITAMIN C PO) Take 1 tablet by mouth daily.     Cholecalciferol (VITAMIN D-3 PO) Take 2 capsules by mouth daily.     Cyanocobalamin (B-12 PO) Take 1 tablet by mouth daily.     ferrous gluconate (FERGON) 324 MG tablet Take 1 tablet (324 mg total) by mouth daily with breakfast. 30 tablet 3   fluticasone (FLONASE) 50 MCG/ACT nasal spray Place 2 sprays into both nostrils daily as needed for allergies or rhinitis. 16 g 0   Multiple Vitamins-Minerals (HAIR SKIN NAILS PO) Take 1 capsule by mouth daily.     pantoprazole (PROTONIX) 40 MG tablet Take 1 tablet (40 mg total) by mouth daily. 30 tablet 3   rosuvastatin (CRESTOR) 40 MG tablet Take 1 tablet (40 mg total) by mouth daily. (Patient taking differently: Take 40 mg by mouth every evening.) 30 tablet 11   torsemide (DEMADEX) 20 MG tablet Take 5 tablets (100 mg total) by mouth 2 (two) times daily.     potassium chloride SA (KLOR-CON M) 20 MEQ tablet Take 1 tablet (20 mEq total) by mouth daily.     doxycycline (VIBRA-TABS) 100 MG tablet Take 1 tablet (100 mg total) by mouth 2 (two) times daily. 14 tablet 0   lidocaine-prilocaine (EMLA) cream Apply to affected area once 30 g 3   potassium chloride SA (KLOR-CON M) 20 MEQ tablet Take 2  tablets (40 mEq total) by mouth daily AND 1 tablet (20 mEq total) at bedtime.     No facility-administered medications prior to visit.     Per HPI unless specifically indicated in ROS section below Review of Systems  Objective:  BP 130/62   Pulse 62   Temp 97.9 F (36.6 C) (Oral)   Ht 5\' 6"  (1.676 m)   Wt 132 lb 6 oz (60 kg)   SpO2 98% Comment: 2 L  BMI 21.37 kg/m   Wt Readings from Last 3 Encounters:  03/26/23 134 lb 1.6 oz (60.8 kg)  03/18/23 136 lb (61.7 kg)  03/13/23 132 lb 11.2 oz (60.2 kg)      Physical Exam Vitals and nursing note reviewed.  Constitutional:      Appearance: Normal appearance. She is not ill-appearing.  HENT:     Head: Normocephalic and atraumatic.     Mouth/Throat:     Comments: Wearing a mask Eyes:     Extraocular Movements: Extraocular movements intact.     Pupils: Pupils are equal, round, and reactive to light.  Cardiovascular:     Rate and Rhythm: Normal rate and regular rhythm.     Pulses: Normal pulses.     Heart  sounds: Murmur (3/6 systolic USB) heard.  Pulmonary:     Effort: Pulmonary effort is normal. No respiratory distress.     Breath sounds: Normal breath sounds. No wheezing, rhonchi or rales.  Musculoskeletal:     Right lower leg: No edema.     Left lower leg: No edema.  Skin:    General: Skin is warm and dry.     Findings: No rash.  Neurological:     Mental Status: She is alert.  Psychiatric:        Mood and Affect: Mood normal.        Behavior: Behavior normal.       Results for orders placed or performed in visit on 02/19/23  Type and screen  Result Value Ref Range   ABO/RH(D) A NEG    Antibody Screen POS    Sample Expiration 02/22/2023,2359    Antibody Identification ANTI FYA (Duffy a)    Unit Number [id number]    Blood Component Type RCLI PHER 1    Unit division 00    Status of Unit ISSUED,FINAL    Donor AG Type      NEGATIVE FOR DUFFY A ANTIGEN NEGATIVE FOR KIDD B ANTIGEN NEGATIVE FOR KELL ANTIGEN    Transfusion Status OK TO TRANSFUSE    Crossmatch Result COMPATIBLE   Prepare RBC (crossmatch)  Result Value Ref Range   Order Confirmation      ORDER PROCESSED BY BLOOD BANK Performed at [facility], [address] [place], [place] [address]   BP[facility]  Result Value Ref Range   ISSUE DATE / TIME [id number]    Blood Product Unit Number [id number]    PRODUCT CODE D1761Y07    Unit Type and Rh 0600    Blood Product Expiration Date [id number]    *Note: Due to a large number of results and/or encounters for the requested time period, some results have not been displayed. A complete set of results can be found in Results Review.   Lab Results  Component Value Date   NA WILL FOLLOW 03/26/2023   CL WILL FOLLOW 03/26/2023   K WILL FOLLOW 03/26/2023   CO2 WILL FOLLOW 03/26/2023   BUN WILL FOLLOW 03/26/2023   CREATININE WILL FOLLOW 03/26/2023   GFRNONAA 27 (L) 03/18/2023   CALCIUM WILL FOLLOW 03/26/2023   PHOS 4.0 01/16/2023   ALBUMIN 4.2 03/18/2023   GLUCOSE WILL FOLLOW 03/26/2023    Lab Results  Component Value Date   WBC 3.8 (L) 03/26/2023   HGB 8.4 (L) 03/26/2023   HCT 28.6 (L) 03/26/2023   MCV 87.7 03/26/2023   PLT 234 03/26/2023   Lab Results  Component Value Date   ALT 6 03/18/2023   AST 14 (L) 03/18/2023   ALKPHOS 53 03/18/2023   BILITOT 0.6 03/18/2023   Lab Results  Component Value Date   VITAMINB12 709 01/16/2023   Assessment & Plan:   Problem List Items Addressed This Visit     History of blood transfusion reaction (Chronic)    Now receiving regular blood transfusions through cancer clinic.       DNR (do not resuscitate) (Chronic)    Outpatient palliative care services to establish with patient       Permanent atrial fibrillation    Has pacemaker and defibrillator.  She is off anticoagulation due to chronic anemia, chronic small intestine AVM bleed.       COPD (chronic obstructive pulmonary disease) (HCC)    Chronic,  stable period on  supplemental oxygen off respiratory medicine.       CKD stage 3b, GFR 30-44 ml/min (HCC)    Latest GFR 32.  Continue to monitor.  Receiving labs regularly through oncology clinic.       ICD (implantable cardioverter-defibrillator) in place   Chronic diastolic CHF (congestive heart failure) (HCC)    Continues high dose torsemide 100mg  BID with potassium 40/75mEq daily.       PAD (peripheral artery disease) (HCC)   History of nephrectomy secondary to RCC   Chronic blood loss anemia - Primary   Acute on chronic diastolic CHF (congestive heart failure) (HCC)    Now on torsemide 100mg  BID with potassium total daily. Planned CHF clinic f/u at end of month, continue current regimen until then.       Symptomatic anemia    Related to chronic GI bleed from intestinal AVM.  Plan is regular Qweekly lab monitoring with transfusions as needed to keep Hgb >8.  No further GI intervention planned.       Palliative care by specialist   Primary pancreatic neuroendocrine tumor    Regularly sees onc Dr Mosetta Putt on monthly Sandostatin injections, recently started on bevacizumab IV QO week x4 treatments, Retacrit as well      Chronic hypoxic respiratory failure, on home oxygen therapy (HCC)    Ordering Afflovest therapy, AVAPS-AE and NIV with AVAPS-AE as per above.         No orders of the defined types were placed in this encounter.   No orders of the defined types were placed in this encounter.   Patient Instructions  Good to see you today Continue weekly labs through oncology center,  Continue current medicines.  Return as needed or in 4-6 moths for follow up visit   Follow up plan: Return in about 6 months (around 08/25/2023), or if symptoms worsen or fail to improve, for follow up visit.  [name], MD

## 2023-02-24 NOTE — Patient Instructions (Addendum)
Good to see you today Continue weekly labs through oncology center,  Continue current medicines.  Return as needed or in 4-6 moths for follow up visit

## 2023-02-24 NOTE — Patient Outreach (Signed)
Care Coordination   02/24/2023 Name: [name] MRN: [id number] DOB: 05-17-1933   Care Coordination Outreach Attempts:  Successful contact made with patient. Patient states she has a doctors appointment today and therefore not able to speak with RNCM.  Request to reschedule appointment.   Follow Up Plan:  Additional outreach attempts will be made to offer the patient care coordination information and services.   Encounter Outcome:  Patient Request to Call Back  Patient outreach rescheduled for 03/04/23 at 3:00 pm   Care Coordination Interventions:  No, not indicated    George Ina RN,BSN,CCM [facility] Care Coordination [phone] direct line

## 2023-02-25 ENCOUNTER — Encounter: Payer: Self-pay | Admitting: Oncology

## 2023-02-25 NOTE — Assessment & Plan Note (Addendum)
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  Plan is regular Qweekly lab monitoring with transfusions as needed to keep Hgb >8.  No further GI intervention planned.

## 2023-02-25 NOTE — Assessment & Plan Note (Signed)
Continues high dose torsemide 100mg  BID with potassium 40/32mEq daily.

## 2023-02-25 NOTE — Assessment & Plan Note (Signed)
Chronic, stable period on supplemental oxygen off respiratory medicine.

## 2023-02-25 NOTE — Assessment & Plan Note (Signed)
Has pacemaker and defibrillator.  She is off anticoagulation due to chronic anemia, chronic small intestine AVM bleed.

## 2023-02-25 NOTE — Assessment & Plan Note (Addendum)
Regularly sees onc Dr Mosetta Putt on monthly Sandostatin injections, recently started on bevacizumab IV QO week x4 treatments, Retacrit as well

## 2023-02-25 NOTE — Assessment & Plan Note (Signed)
Now receiving regular blood transfusions through cancer clinic.

## 2023-02-25 NOTE — Assessment & Plan Note (Signed)
Outpatient palliative care services to establish with patient

## 2023-02-25 NOTE — Assessment & Plan Note (Signed)
Now on torsemide 100mg  BID with potassium total daily. Planned CHF clinic f/u at end of month, continue current regimen until then.

## 2023-02-25 NOTE — Assessment & Plan Note (Addendum)
Latest GFR 32.  Continue to monitor.  Receiving labs regularly through oncology clinic.

## 2023-02-26 ENCOUNTER — Inpatient Hospital Stay: Payer: PPO

## 2023-02-26 ENCOUNTER — Inpatient Hospital Stay: Payer: PPO | Admitting: Physician Assistant

## 2023-02-26 ENCOUNTER — Other Ambulatory Visit: Payer: Self-pay

## 2023-02-26 VITALS — BP 152/50 | HR 59

## 2023-02-26 DIAGNOSIS — D3A8 Other benign neuroendocrine tumors: Secondary | ICD-10-CM

## 2023-02-26 DIAGNOSIS — K5520 Angiodysplasia of colon without hemorrhage: Secondary | ICD-10-CM

## 2023-02-26 DIAGNOSIS — J449 Chronic obstructive pulmonary disease, unspecified: Secondary | ICD-10-CM | POA: Diagnosis not present

## 2023-02-26 DIAGNOSIS — N1832 Chronic kidney disease, stage 3b: Secondary | ICD-10-CM | POA: Diagnosis not present

## 2023-02-26 DIAGNOSIS — I5033 Acute on chronic diastolic (congestive) heart failure: Secondary | ICD-10-CM | POA: Diagnosis not present

## 2023-02-26 DIAGNOSIS — D649 Anemia, unspecified: Secondary | ICD-10-CM

## 2023-02-26 LAB — CBC WITH DIFFERENTIAL (CANCER CENTER ONLY)
Abs Immature Granulocytes: 0.01 10*3/uL (ref 0.00–0.07)
Basophils Absolute: 0 10*3/uL (ref 0.0–0.1)
Basophils Relative: 0 %
Eosinophils Absolute: 0 10*3/uL (ref 0.0–0.5)
Eosinophils Relative: 0 %
HCT: 30.4 % — ABNORMAL LOW (ref 36.0–46.0)
Hemoglobin: 9 g/dL — ABNORMAL LOW (ref 12.0–15.0)
Immature Granulocytes: 0 %
Lymphocytes Relative: 12 %
Lymphs Abs: 0.4 10*3/uL — ABNORMAL LOW (ref 0.7–4.0)
MCH: 28.2 pg (ref 26.0–34.0)
MCHC: 29.6 g/dL — ABNORMAL LOW (ref 30.0–36.0)
MCV: 95.3 fL (ref 80.0–100.0)
Monocytes Absolute: 0.3 10*3/uL (ref 0.1–1.0)
Monocytes Relative: 9 %
Neutro Abs: 2.5 10*3/uL (ref 1.7–7.7)
Neutrophils Relative %: 79 %
Platelet Count: 174 10*3/uL (ref 150–400)
RBC: 3.19 MIL/uL — ABNORMAL LOW (ref 3.87–5.11)
RDW: 18.5 % — ABNORMAL HIGH (ref 11.5–15.5)
WBC Count: 3.2 10*3/uL — ABNORMAL LOW (ref 4.0–10.5)
nRBC: 0 % (ref 0.0–0.2)

## 2023-02-26 LAB — CMP (CANCER CENTER ONLY)
ALT: 8 U/L (ref 0–44)
AST: 15 U/L (ref 15–41)
Albumin: 4 g/dL (ref 3.5–5.0)
Alkaline Phosphatase: 50 U/L (ref 38–126)
Anion gap: 7 (ref 5–15)
BUN: 39 mg/dL — ABNORMAL HIGH (ref 8–23)
CO2: 30 mmol/L (ref 22–32)
Calcium: 9.1 mg/dL (ref 8.9–10.3)
Chloride: 103 mmol/L (ref 98–111)
Creatinine: 1.56 mg/dL — ABNORMAL HIGH (ref 0.44–1.00)
GFR, Estimated: 32 mL/min — ABNORMAL LOW (ref >60)
Glucose, Bld: 122 mg/dL — ABNORMAL HIGH (ref 70–99)
Potassium: 3.9 mmol/L (ref 3.5–5.1)
Sodium: 140 mmol/L (ref 135–145)
Total Bilirubin: 0.8 mg/dL (ref 0.3–1.2)
Total Protein: 7.5 g/dL (ref 6.5–8.1)

## 2023-02-26 LAB — SAMPLE TO BLOOD BANK

## 2023-02-26 MED ORDER — EPOETIN ALFA-EPBX 20000 UNIT/ML IJ SOLN
20000.0000 [IU] | Freq: Once | INTRAMUSCULAR | Status: AC
  Administered 2023-02-26: 20000 [IU] via SUBCUTANEOUS
  Filled 2023-02-26: qty 1

## 2023-02-26 MED ORDER — SODIUM CHLORIDE 0.9 % IV SOLN: Freq: Once | INTRAVENOUS | Status: AC

## 2023-02-26 MED ORDER — SODIUM CHLORIDE 0.9 % IV SOLN
5.0000 mg/kg | Freq: Once | INTRAVENOUS | Status: AC
  Administered 2023-02-26: 300 mg via INTRAVENOUS
  Filled 2023-02-26: qty 12

## 2023-02-26 NOTE — Patient Instructions (Signed)
[facility]  Discharge Instructions: Thank you for choosing [facility] to provide your oncology and hematology care.   If you have a lab appointment with the [facility], please go directly to the [facility] and check in at the registration area.   Wear comfortable clothing and clothing appropriate for easy access to any Portacath or PICC line.   We strive to give you quality time with your provider. You may need to reschedule your appointment if you arrive late (15 or more minutes).  Arriving late affects you and other patients whose appointments are after yours.  Also, if you miss three or more appointments without notifying the office, you may be dismissed from the clinic at the provider's discretion.      For prescription refill requests, have your pharmacy contact our office and allow 72 hours for refills to be completed.    Today you received the following chemotherapy and/or immunotherapy agents: Bevacizumab      To help prevent nausea and vomiting after your treatment, we encourage you to take your nausea medication as directed.  BELOW ARE SYMPTOMS THAT SHOULD BE REPORTED IMMEDIATELY: *FEVER GREATER THAN 100.4 F (38 C) OR HIGHER *CHILLS OR SWEATING *NAUSEA AND VOMITING THAT IS NOT CONTROLLED WITH YOUR NAUSEA MEDICATION *UNUSUAL SHORTNESS OF BREATH *UNUSUAL BRUISING OR BLEEDING *URINARY PROBLEMS (pain or burning when urinating, or frequent urination) *BOWEL PROBLEMS (unusual diarrhea, constipation, pain near the anus) TENDERNESS IN MOUTH AND THROAT WITH OR WITHOUT PRESENCE OF ULCERS (sore throat, sores in mouth, or a toothache) UNUSUAL RASH, SWELLING OR PAIN  UNUSUAL VAGINAL DISCHARGE OR ITCHING   Items with * indicate a potential emergency and should be followed up as soon as possible or go to the Emergency Department if any problems should occur.  Please show the CHEMOTHERAPY ALERT CARD or IMMUNOTHERAPY ALERT CARD at  check-in to the Emergency Department and triage nurse.  Should you have questions after your visit or need to cancel or reschedule your appointment, please contact [facility]  Dept: [phone]  and follow the prompts.  Office hours are 8:00 a.m. to 4:30 p.m. Monday - Friday. Please note that voicemails left after 4:00 p.m. may not be returned until the following business day.  We are closed weekends and major holidays. You have access to a nurse at all times for urgent questions. Please call the main number to the clinic Dept: [phone] and follow the prompts.   For any non-urgent questions, you may also contact your provider using MyChart. We now offer e-Visits for anyone 82 and older to request care online for non-urgent symptoms. For details visit [email or web address].   Also download the MyChart app! Go to the app store, search "MyChart", open the app, select [facility], and log in with your MyChart username and password.

## 2023-02-27 ENCOUNTER — Encounter: Payer: Self-pay | Admitting: Hematology

## 2023-02-27 ENCOUNTER — Encounter: Payer: Self-pay | Admitting: Oncology

## 2023-02-27 ENCOUNTER — Other Ambulatory Visit (HOSPITAL_COMMUNITY): Payer: Self-pay | Admitting: Cardiology

## 2023-02-27 LAB — CHROMOGRANIN A: Chromogranin A (ng/mL): 108.9 ng/mL — ABNORMAL HIGH (ref 0.0–101.8)

## 2023-02-27 NOTE — Progress Notes (Signed)
[facility]   [phone] [phone]   Clinic Follow up Note   Patient Care Team: [name], MD as PCP - General (Family Medicine) Duke Salvia, MD as PCP - Cardiology (Cardiology) Laurey Morale, MD as PCP - Advanced Heart Failure (Cardiology) Antonieta Iba, MD as Consulting Physician (Cardiology) Duke Salvia, MD as Consulting Physician (Cardiology) Gilda Crease, Latina Craver, MD as Consulting Physician (Vascular Surgery) Creig Hines, MD as Consulting Physician (Hematology and Oncology) Malachy Mood, MD as Consulting Physician (Oncology) Kathyrn Sheriff, [facility] (Inactive) as Pharmacist (Pharmacist)  Date of Service:  02/27/2023  CHIEF COMPLAINT: Anemia and neuroendocrine tumor  CURRENT THERAPY:  Sandostatin injection every 4 weeks IV iron as needed Blood transfusion as needed Bevacizumab every 2 weeksX4 starting 02/12/2023  INTERVAL HISTORY: [name] returns for a follow up. She reports her energy levels are fairly stable. She continues to use 2L of supplemental oxygen which is her baseline. She reports occasional episodes of dizziness mainly with ambulation. She has intermittent episodes of dark stools which is chronic in nature. She denies any active bleeding at this time. Her energy levels are overall stable from her baseline. She denies nausea, vomiting or bowel habit changes.   All other systems were reviewed with the patient and are negative.  MEDICAL HISTORY:  Past Medical History:  Diagnosis Date   (HFpEF) heart failure with preserved ejection fraction (HCC)    EF=60-60%   Actinic keratosis 01/17/2015   R forearm   Adjustment disorder with anxiety    Adverse effect of other narcotics, sequela    Intolerance to all narcotics   Anti-Duffy antibodies present    Aortic valve stenosis    Arthritis    "some in my hands" (11/11/2012)   Atrial fibrillation, permanent (HCC)    Eliquis   Atypical mole 03/25/2018    L forearm - severe   Automatic implantable cardioverter-defibrillator in situ    Avascular necrosis of hip (HCC) 05/03/2011   Carotid artery stenosis 09/2007   a. 09/2007: 60-79% bilateral (stable); b. 10/2008: 40-59% R 60-79%    Cellulitis of left lower extremity 07/13/2020   CKD (chronic kidney disease), stage III (HCC)    COPD (chronic obstructive pulmonary disease) (HCC)    Coronary artery disease    non-obstructive by 2006 cath   COVID-19 virus infection 02/12/2022   Displaced fracture of left femoral neck (HCC) 10/09/2018   GI bleed 03/28/2020   AVM   High cholesterol    Hypertension 05/20/2011   Hypertr obst cardiomyop    Hypotension, unspecified    cardiac cath 2006..nonobstructive CAD 30-40s lesions.Marland KitchenETT 1/09 nondiagnostic due to poor HR response..Right Renal Cancer 2003   Iron deficiency anemia    Long term (current) use of anticoagulants    On home oxygen therapy    3L [place]   Osteoarthritis of right hip    PAD (peripheral artery disease) (HCC)    Pancreatic cancer (HCC)    sandostatin   PONV (postoperative nausea and vomiting)    Presence of permanent cardiac pacemaker    Pulmonary HTN (HCC)    RECTAL BLEEDING 10/13/2009   Qualifier: Diagnosis of  By: Wilmon Pali NP, Paula     Red blood cell antibody positive with compatible PRBC difficult to obtain    Anti FYA (Duffy a) antibody. Must be transfused with PRB which are Duffy Antigen Negative and Crossmatch Compatible   Renal cell carcinoma (HCC)    s/p nephrectomy   Squamous cell  carcinoma of skin 01/17/2015   R lat wrist   Squamous cell carcinoma of skin 01/29/2018   R post upper leg - superficially invasive   Squamous cell carcinoma of skin 03/25/2018   L lat foot   Squamous cell carcinoma of skin 11/09/2020   left lat foot - EDC 01/01/21, recurrent 01/31/21 - MOHs 02/22/21   Squamous cell carcinoma of skin 12/19/2021   Right Posterior Medial Thigh, EDC   Squamous cell carcinoma of skin 12/19/2021   SCC IS, L lat  heel, EDC 02/04/2022   Squamous cell carcinoma of skin 01/21/2022   L forearm, EDC 02/04/2022   Squamous cell carcinoma of skin 01/21/2022   R lower leg below knee, EDC 02/04/2022   Squamous cell carcinoma of skin 01/21/2022   SCCIS, R post heel, EDC 02/04/2022   Squamous cell carcinoma of skin 07/01/2022   left lower abdomen, in situ, EDC   Squamous cell carcinoma of skin 07/01/2022   left medial chest, EDC   Urge incontinence    Venous stasis of both lower extremities     SURGICAL HISTORY: Past Surgical History:  Procedure Laterality Date   ABDOMINAL AORTOGRAM W/LOWER EXTREMITY N/A 02/12/2021   Procedure: ABDOMINAL AORTOGRAM W/LOWER EXTREMITY;  Surgeon: Maeola Harman, MD;  Location: [facility] INVASIVE CV LAB;  Service: Cardiovascular;  Laterality: N/A;   ABDOMINAL HYSTERECTOMY  1975   for benign causes   APPENDECTOMY     BI-VENTRICULAR PACEMAKER UPGRADE  05/04/2010   BIOPSY  02/28/2019   Procedure: BIOPSY;  Surgeon: Rachael Fee, MD;  Location: [facility] ENDOSCOPY;  Service: Endoscopy;;   BIOPSY  03/04/2022   Procedure: BIOPSY;  Surgeon: Lemar Lofty., MD;  Location: [facility] ENDOSCOPY;  Service: Gastroenterology;;   CARDIAC CATHETERIZATION  2006   CARDIOVERSION N/A 02/20/2018   Procedure: CARDIOVERSION;  Surgeon: Antonieta Iba, MD;  Location: [facility] ORS;  Service: Cardiovascular;  Laterality: N/A;   CARDIOVERSION N/A 03/27/2018   Procedure: CARDIOVERSION;  Surgeon: Antonieta Iba, MD;  Location: [facility] ORS;  Service: Cardiovascular;  Laterality: N/A;   CATARACT EXTRACTION W/ INTRAOCULAR LENS  IMPLANT, BILATERAL  01/2006-02-2006   CHOLECYSTECTOMY N/A 11/11/2012   Procedure: LAPAROSCOPIC CHOLECYSTECTOMY WITH INTRAOPERATIVE CHOLANGIOGRAM;  Surgeon: Wilmon Arms. Corliss Skains, MD;  Location: [facility] OR;  Service: General;  Laterality: N/A;   COLONOSCOPY WITH PROPOFOL N/A 02/28/2019   Procedure: COLONOSCOPY WITH PROPOFOL;  Surgeon: Rachael Fee, MD;  Location: [facility] ENDOSCOPY;  Service:  Endoscopy;  Laterality: N/A;   ENTEROSCOPY N/A 03/30/2020   Procedure: ENTEROSCOPY;  Surgeon: Shellia Cleverly, DO;  Location: [facility] ENDOSCOPY;  Service: Gastroenterology;  Laterality: N/A;   ENTEROSCOPY N/A 02/10/2021   Procedure: ENTEROSCOPY;  Surgeon: Jeani Hawking, MD;  Location: [facility] ENDOSCOPY;  Service: Endoscopy;  Laterality: N/A;   ENTEROSCOPY N/A 02/15/2022   Procedure: ENTEROSCOPY;  Surgeon: Imogene Burn, MD;  Location: [facility] ENDOSCOPY;  Service: Gastroenterology;  Laterality: N/A;   ENTEROSCOPY N/A 11/06/2022   Procedure: ENTEROSCOPY;  Surgeon: Meridee Score Netty Starring., MD;  Location: [facility] ENDOSCOPY;  Service: Gastroenterology;  Laterality: N/A;   EP IMPLANTABLE DEVICE N/A 02/21/2016   Procedure: ICD Generator Changeout;  Surgeon: Duke Salvia, MD;  Location: [facility] INVASIVE CV LAB;  Service: Cardiovascular;  Laterality: N/A;   ESOPHAGOGASTRODUODENOSCOPY N/A 03/04/2022   Procedure: ESOPHAGOGASTRODUODENOSCOPY (EGD);  Surgeon: Lemar Lofty., MD;  Location: [facility] ENDOSCOPY;  Service: Gastroenterology;  Laterality: N/A;   ESOPHAGOGASTRODUODENOSCOPY (EGD) WITH PROPOFOL N/A 02/28/2019   Procedure: ESOPHAGOGASTRODUODENOSCOPY (EGD) WITH PROPOFOL;  Surgeon: Rachael Fee, MD;  Location: [facility] ENDOSCOPY;  Service: Endoscopy;  Laterality: N/A;   EUS N/A 03/04/2022   Procedure: UPPER ENDOSCOPIC ULTRASOUND (EUS) RADIAL;  Surgeon: Lemar Lofty., MD;  Location: [facility] ENDOSCOPY;  Service: Gastroenterology;  Laterality: N/A;   FINE NEEDLE ASPIRATION N/A 03/04/2022   Procedure: FINE NEEDLE ASPIRATION (FNA) LINEAR;  Surgeon: Lemar Lofty., MD;  Location: [facility] ENDOSCOPY;  Service: Gastroenterology;  Laterality: N/A;   GIVENS CAPSULE STUDY N/A 03/15/2019   Procedure: GIVENS CAPSULE STUDY;  Surgeon: Tressia Danas, MD;  Location: [facility] ENDOSCOPY;  Service: Gastroenterology;  Laterality: N/A;   HOT HEMOSTASIS N/A 03/30/2020   Procedure: HOT HEMOSTASIS (ARGON PLASMA COAGULATION/BICAP);  Surgeon:  Shellia Cleverly, DO;  Location: [facility] ENDOSCOPY;  Service: Gastroenterology;  Laterality: N/A;   HOT HEMOSTASIS N/A 02/10/2021   Procedure: HOT HEMOSTASIS (ARGON PLASMA COAGULATION/BICAP);  Surgeon: Jeani Hawking, MD;  Location: [facility] ENDOSCOPY;  Service: Endoscopy;  Laterality: N/A;   HOT HEMOSTASIS N/A 02/15/2022   Procedure: HOT HEMOSTASIS (ARGON PLASMA COAGULATION/BICAP);  Surgeon: Imogene Burn, MD;  Location: [facility] ENDOSCOPY;  Service: Gastroenterology;  Laterality: N/A;   HOT HEMOSTASIS N/A 11/06/2022   Procedure: HOT HEMOSTASIS (ARGON PLASMA COAGULATION/BICAP);  Surgeon: Lemar Lofty., MD;  Location: [facility] ENDOSCOPY;  Service: Gastroenterology;  Laterality: N/A;   INSERT / REPLACE / REMOVE PACEMAKER  05-01-11   02-28-05-/05-04-10-ICD-MEDTRONIC MAXIMAL DR   IR ANGIOGRAM SELECTIVE EACH ADDITIONAL VESSEL  02/18/2022   IR ANGIOGRAM SELECTIVE EACH ADDITIONAL VESSEL  02/18/2022   IR ANGIOGRAM VISCERAL SELECTIVE  02/16/2022   IR ANGIOGRAM VISCERAL SELECTIVE  02/16/2022   IR EMBO ART  VEN HEMORR LYMPH EXTRAV  INC GUIDE ROADMAPPING  02/16/2022   IR THORACENTESIS ASP PLEURAL SPACE W/IMG GUIDE  02/21/2022   IR THORACENTESIS ASP PLEURAL SPACE W/IMG GUIDE  02/22/2022   IR US GUIDE VASC ACCESS RIGHT  02/16/2022   JOINT REPLACEMENT     LAPAROSCOPIC CHOLECYSTECTOMY  11/11/2012   LAPAROSCOPIC LYSIS OF ADHESIONS N/A 11/11/2012   Procedure: LAPAROSCOPIC LYSIS OF ADHESIONS;  Surgeon: Wilmon Arms. Corliss Skains, MD;  Location: [facility] OR;  Service: General;  Laterality: N/A;   NEPHRECTOMY Right 06/2001    S/P RENAL CELL CANCER   PERIPHERAL VASCULAR INTERVENTION Bilateral 02/12/2021   Procedure: PERIPHERAL VASCULAR INTERVENTION;  Surgeon: Maeola Harman, MD;  Location: [facility] INVASIVE CV LAB;  Service: Cardiovascular;  Laterality: Bilateral;  Iliac artery stents   PRESSURE SENSOR/CARDIOMEMS N/A 02/03/2019   Procedure: PRESSURE SENSOR/CARDIOMEMS;  Surgeon: Laurey Morale, MD;  Location: [facility] INVASIVE CV LAB;  Service:  Cardiovascular;  Laterality: N/A;   RIGHT HEART CATH N/A 11/09/2018   Procedure: RIGHT HEART CATH;  Surgeon: Laurey Morale, MD;  Location: [facility] INVASIVE CV LAB;  Service: Cardiovascular;  Laterality: N/A;   RIGHT HEART CATH N/A 03/08/2019   Procedure: RIGHT HEART CATH;  Surgeon: Laurey Morale, MD;  Location: [facility] INVASIVE CV LAB;  Service: Cardiovascular;  Laterality: N/A;   SUBMUCOSAL TATTOO INJECTION  02/28/2019   Procedure: SUBMUCOSAL TATTOO INJECTION;  Surgeon: Rachael Fee, MD;  Location: [facility] ENDOSCOPY;  Service: Endoscopy;;   SUBMUCOSAL TATTOO INJECTION  11/06/2022   Procedure: SUBMUCOSAL TATTOO INJECTION;  Surgeon: Lemar Lofty., MD;  Location: [facility] ENDOSCOPY;  Service: Gastroenterology;;   TOTAL HIP ARTHROPLASTY Right 05/03/2011   Procedure: TOTAL HIP ARTHROPLASTY ANTERIOR APPROACH;  Surgeon: Kathryne Hitch;  Location: [facility] ORS;  Service: Orthopedics;  Laterality: Right;  Removal of Cannulated Screws Right Hip, Right Direct Anterior Hip Replacement   TOTAL HIP ARTHROPLASTY Left 10/09/2018  Procedure: TOTAL HIP ARTHROPLASTY ANTERIOR APPROACH;  Surgeon: Samson Frederic, MD;  Location: [facility] OR;  Service: Orthopedics;  Laterality: Left;    I have reviewed the social history and family history with the patient and they are unchanged from previous note.  ALLERGIES:  is allergic to codeine, amoxil [amoxicillin], asa [aspirin], ms contin [morphine], neurontin [gabapentin], and nsaids.  MEDICATIONS:  Current Outpatient Medications  Medication Sig Dispense Refill   acetaminophen (TYLENOL) 500 MG tablet Take 1,000 mg by mouth daily.     Ascorbic Acid (VITAMIN C PO) Take 1 tablet by mouth daily.     Cholecalciferol (VITAMIN D-3 PO) Take 2 capsules by mouth daily.     Cyanocobalamin (B-12 PO) Take 1 tablet by mouth daily.     ferrous gluconate (FERGON) 324 MG tablet Take 1 tablet (324 mg total) by mouth daily with breakfast. 30 tablet 3   fluticasone (FLONASE) 50 MCG/ACT nasal spray  Place 2 sprays into both nostrils daily as needed for allergies or rhinitis. 16 g 0   Multiple Vitamins-Minerals (HAIR SKIN NAILS PO) Take 1 capsule by mouth daily.     pantoprazole (PROTONIX) 40 MG tablet Take 1 tablet (40 mg total) by mouth daily. 30 tablet 3   potassium chloride SA (KLOR-CON M) 20 MEQ tablet Take 2 tablets (40 mEq total) by mouth daily AND 1 tablet (20 mEq total) at bedtime.     rosuvastatin (CRESTOR) 40 MG tablet Take 1 tablet (40 mg total) by mouth daily. (Patient taking differently: Take 40 mg by mouth every evening.) 30 tablet 11   torsemide (DEMADEX) 20 MG tablet Take 5 tablets (100 mg total) by mouth 2 (two) times daily.     No current facility-administered medications for this visit.    PHYSICAL EXAMINATION: ECOG PERFORMANCE STATUS: 3 - Symptomatic, >50% confined to bed  Vitals:   02/26/23 1500  BP: 126/62  Pulse: 60  Resp: 16  Temp: (!) 97.4 F (36.3 C)  SpO2: 100%   Wt Readings from Last 3 Encounters:  02/26/23 132 lb 1.6 oz (59.9 kg)  02/24/23 132 lb 6 oz (60 kg)  02/12/23 132 lb 1.6 oz (59.9 kg)     GENERAL:alert, no distress and comfortable SKIN: skin color, texture, turgor are normal, no rashes or significant lesions EYES: normal, Conjunctiva are pink and non-injected, sclera clear LUNGS: clear to auscultation and percussion with normal breathing effort HEART: regular rate & rhythm and no murmurs and no lower extremity edema Musculoskeletal:no cyanosis of digits and no clubbing  NEURO: alert & oriented x 3 with fluent speech, no focal motor/sensory deficits   LABORATORY DATA:  I have reviewed the data as listed    Latest Ref Rng & Units 02/26/2023    2:43 PM 02/19/2023    1:24 PM 02/12/2023    1:09 PM  CBC  WBC 4.0 - 10.5 K/uL 3.2  2.5  4.5   Hemoglobin 12.0 - 15.0 g/dL 9.0  7.6  8.0   Hematocrit 36.0 - 46.0 % 30.4  25.1  27.4   Platelets 150 - 400 K/uL 174  153  147         Latest Ref Rng & Units 02/26/2023    2:43 PM 02/19/2023     1:24 PM 02/12/2023    1:09 PM  CMP  Glucose 70 - 99 mg/dL 536  144  315   BUN 8 - 23 mg/dL 39  53  35   Creatinine 0.44 - 1.00 mg/dL 4.00  8.67  1.53   Sodium 135 - 145 mmol/L 140  138  138   Potassium 3.5 - 5.1 mmol/L 3.9  4.1  4.3   Chloride 98 - 111 mmol/L 103  99  100   CO2 22 - 32 mmol/L 30  30  32   Calcium 8.9 - 10.3 mg/dL 9.1  9.2  9.2   Total Protein 6.5 - 8.1 g/dL 7.5  7.3  7.6   Total Bilirubin 0.3 - 1.2 mg/dL 0.8  0.8  1.2   Alkaline Phos 38 - 126 U/L 50  51  53   AST 15 - 41 U/L 15  13  16    ALT 0 - 44 U/L 8  7  8        RADIOGRAPHIC STUDIES: I have personally reviewed the radiological images as listed and agreed with the findings in the report. No results found.   ASSESSMENT: CHAIRTY RUMBERGER is a 89 y.o. female who presents for a follow up for continued management of primary pancreatic neuroendocrine tumor and anemia.   #Primary pancreatic neuroendocrine tumor -She presented with upper GI bleed and symptomatic anemia, work-up showed hypervascular tumor in the pancreatic head closely involving the duodenum; CTA revealed no other clear source of bleeding or primary site of malignancy; a small right liver lesion was felt to be benign but was not biopsied.   -Outpatient EUS 03/04/2022 by Dr. Meridee Score showed a T3N0 mass in the pancreatic head/uncinate process with 2 satellite lesions in the tail; biopsy confirmed neuroendocrine tumor of the pancreatic head mass -Reviewed her case in GI conference, Dr. Freida Busman did not recommend surgery in multifocal NET and also due to her age and co-morbidities. -Started first line sandostatin injections q28 days on 03/27/22.   #Anemia -She has a longstanding history of anemia for at least 10 years, likely more; and history of GI bleeding from AVMs -She has received various formulations of IV iron in the past (ferlecit in 2020, Feraheme in 02/2022), B12 injections (last in 2021), and ESA (Retacrit, 2021) -She was recently hospitalized  for anemia and a GI bleeding in July 2024. -She is currently on ESA every 2 weeks, IV iron as needed, and blood transfusion as needed. -Due to her multiple hospital admission for anemia and GI bleeding, started bevacizumab for AVM. The benefits and potential side effects, especially hypertension, proteinuria, small risk of thrombosis including stroke and heart attack, bowel perforation, were discussed with her in detail, she agrees to proceed. Plan to give every 2 weeks for 4 doses. -Continue Retacrit every 2 weeks if hemoglobin less than 10.5 -Continue IV iron if ferritin less than 20   PLAN: --Due for Cycle 2 of Bevacizumab along with retacrit injection today --Labs from today were reviewed and adequate for treatment. WBC 3.2, Hgb 9.0, Plt 174K. Creatinine stable at 1.56, LFTs normal.  --Proceed with treatment today.  --No need for blood transfusion today.  --Iron panel from 02/12/2023 showed ferritin 61, no need for IV iron at this time.  --RTC next week for labs and slot for blood transfusion if needed --Next treatment will be on 03/13/2023 for Cycle 3 of Bevacizumab, retacrit injection and Sandostatin injection.     No orders of the defined types were placed in this encounter.  All questions were answered. The patient knows to call the clinic with any problems, questions or concerns. No barriers to learning was detected.   I have spent a total of 30 minutes minutes of face-to-face and non-face-to-face time, preparing to  see the patient,  performing a medically appropriate examination, counseling and educating the patient,  documenting clinical information in the electronic health record, independently interpreting results and communicating results to the patient, and care coordination.   Georga Kaufmann PA-C Dept of Hematology and Oncology [facility] at [facility] Phone: [phone]

## 2023-03-04 ENCOUNTER — Ambulatory Visit: Payer: Self-pay

## 2023-03-04 DIAGNOSIS — K31819 Angiodysplasia of stomach and duodenum without bleeding: Secondary | ICD-10-CM | POA: Diagnosis not present

## 2023-03-04 DIAGNOSIS — J449 Chronic obstructive pulmonary disease, unspecified: Secondary | ICD-10-CM | POA: Diagnosis not present

## 2023-03-04 DIAGNOSIS — I5032 Chronic diastolic (congestive) heart failure: Secondary | ICD-10-CM | POA: Diagnosis not present

## 2023-03-04 DIAGNOSIS — C7A8 Other malignant neuroendocrine tumors: Secondary | ICD-10-CM | POA: Diagnosis not present

## 2023-03-04 DIAGNOSIS — D500 Iron deficiency anemia secondary to blood loss (chronic): Secondary | ICD-10-CM | POA: Diagnosis not present

## 2023-03-04 NOTE — Patient Outreach (Signed)
Care Coordination   03/04/2023 Name: [name] MRN: [id number] DOB: [birth year]   Care Coordination Outreach Attempts:  Successful contact made with patient. Patient apologized that she is not able to speak with RNCM today.  She states she is meeting with palliative care currently.  Patient states it would be best to call her on a Monday.  Follow Up Plan:  Additional outreach attempts will be made to offer the patient care coordination information and services.   Encounter Outcome:  Patient Request to Call Back   Care Coordination Interventions:  No, not indicated    George Ina RN,BSN,CCM [facility]  [facility] coordinator / Case Manager Phone: [phone]

## 2023-03-05 ENCOUNTER — Encounter (HOSPITAL_COMMUNITY): Payer: Self-pay

## 2023-03-05 ENCOUNTER — Telehealth: Payer: Self-pay

## 2023-03-05 ENCOUNTER — Inpatient Hospital Stay: Payer: PPO

## 2023-03-05 ENCOUNTER — Other Ambulatory Visit: Payer: Self-pay

## 2023-03-05 ENCOUNTER — Ambulatory Visit (HOSPITAL_BASED_OUTPATIENT_CLINIC_OR_DEPARTMENT_OTHER)
Admission: RE | Admit: 2023-03-05 | Discharge: 2023-03-05 | Disposition: A | Discharge status: Home / Self Care | Payer: PPO | Source: Ambulatory Visit | Attending: Family Medicine | Admitting: Family Medicine

## 2023-03-05 VITALS — BP 128/60 | HR 60 | Wt 136.0 lb

## 2023-03-05 DIAGNOSIS — J449 Chronic obstructive pulmonary disease, unspecified: Secondary | ICD-10-CM | POA: Insufficient documentation

## 2023-03-05 DIAGNOSIS — C7B8 Other secondary neuroendocrine tumors: Secondary | ICD-10-CM | POA: Insufficient documentation

## 2023-03-05 DIAGNOSIS — N184 Chronic kidney disease, stage 4 (severe): Secondary | ICD-10-CM | POA: Insufficient documentation

## 2023-03-05 DIAGNOSIS — D62 Acute posthemorrhagic anemia: Secondary | ICD-10-CM | POA: Insufficient documentation

## 2023-03-05 DIAGNOSIS — D3A8 Other benign neuroendocrine tumors: Secondary | ICD-10-CM | POA: Diagnosis not present

## 2023-03-05 DIAGNOSIS — R0602 Shortness of breath: Secondary | ICD-10-CM | POA: Insufficient documentation

## 2023-03-05 DIAGNOSIS — K921 Melena: Secondary | ICD-10-CM | POA: Insufficient documentation

## 2023-03-05 DIAGNOSIS — Z9981 Dependence on supplemental oxygen: Secondary | ICD-10-CM | POA: Insufficient documentation

## 2023-03-05 DIAGNOSIS — K5520 Angiodysplasia of colon without hemorrhage: Secondary | ICD-10-CM

## 2023-03-05 DIAGNOSIS — I350 Nonrheumatic aortic (valve) stenosis: Secondary | ICD-10-CM | POA: Diagnosis not present

## 2023-03-05 DIAGNOSIS — I428 Other cardiomyopathies: Secondary | ICD-10-CM | POA: Insufficient documentation

## 2023-03-05 DIAGNOSIS — I082 Rheumatic disorders of both aortic and tricuspid valves: Secondary | ICD-10-CM | POA: Insufficient documentation

## 2023-03-05 DIAGNOSIS — I10 Essential (primary) hypertension: Secondary | ICD-10-CM

## 2023-03-05 DIAGNOSIS — Z8616 Personal history of COVID-19: Secondary | ICD-10-CM | POA: Insufficient documentation

## 2023-03-05 DIAGNOSIS — Z95810 Presence of automatic (implantable) cardiac defibrillator: Secondary | ICD-10-CM | POA: Insufficient documentation

## 2023-03-05 DIAGNOSIS — D649 Anemia, unspecified: Secondary | ICD-10-CM

## 2023-03-05 DIAGNOSIS — I272 Pulmonary hypertension, unspecified: Secondary | ICD-10-CM | POA: Insufficient documentation

## 2023-03-05 DIAGNOSIS — Z79899 Other long term (current) drug therapy: Secondary | ICD-10-CM | POA: Insufficient documentation

## 2023-03-05 DIAGNOSIS — Z7189 Other specified counseling: Secondary | ICD-10-CM

## 2023-03-05 DIAGNOSIS — Z66 Do not resuscitate: Secondary | ICD-10-CM | POA: Insufficient documentation

## 2023-03-05 DIAGNOSIS — I130 Hypertensive heart and chronic kidney disease with heart failure and stage 1 through stage 4 chronic kidney disease, or unspecified chronic kidney disease: Secondary | ICD-10-CM | POA: Insufficient documentation

## 2023-03-05 DIAGNOSIS — N1832 Chronic kidney disease, stage 3b: Secondary | ICD-10-CM | POA: Diagnosis not present

## 2023-03-05 DIAGNOSIS — I5032 Chronic diastolic (congestive) heart failure: Secondary | ICD-10-CM | POA: Insufficient documentation

## 2023-03-05 DIAGNOSIS — I4821 Permanent atrial fibrillation: Secondary | ICD-10-CM | POA: Insufficient documentation

## 2023-03-05 DIAGNOSIS — Z8719 Personal history of other diseases of the digestive system: Secondary | ICD-10-CM | POA: Diagnosis not present

## 2023-03-05 LAB — CBC WITH DIFFERENTIAL (CANCER CENTER ONLY)
Abs Immature Granulocytes: 0.01 10*3/uL (ref 0.00–0.07)
Basophils Absolute: 0 10*3/uL (ref 0.0–0.1)
Basophils Relative: 0 %
Eosinophils Absolute: 0 10*3/uL (ref 0.0–0.5)
Eosinophils Relative: 0 %
HCT: 28.3 % — ABNORMAL LOW (ref 36.0–46.0)
Hemoglobin: 8.3 g/dL — ABNORMAL LOW (ref 12.0–15.0)
Immature Granulocytes: 0 %
Lymphocytes Relative: 12 %
Lymphs Abs: 0.4 10*3/uL — ABNORMAL LOW (ref 0.7–4.0)
MCH: 27.3 pg (ref 26.0–34.0)
MCHC: 29.3 g/dL — ABNORMAL LOW (ref 30.0–36.0)
MCV: 93.1 fL (ref 80.0–100.0)
Monocytes Absolute: 0.3 10*3/uL (ref 0.1–1.0)
Monocytes Relative: 10 %
Neutro Abs: 2.4 10*3/uL (ref 1.7–7.7)
Neutrophils Relative %: 78 %
Platelet Count: 171 10*3/uL (ref 150–400)
RBC: 3.04 MIL/uL — ABNORMAL LOW (ref 3.87–5.11)
RDW: 18.1 % — ABNORMAL HIGH (ref 11.5–15.5)
WBC Count: 3.2 10*3/uL — ABNORMAL LOW (ref 4.0–10.5)
nRBC: 0 % (ref 0.0–0.2)

## 2023-03-05 LAB — CMP (CANCER CENTER ONLY)
ALT: 5 U/L (ref 0–44)
AST: 15 U/L (ref 15–41)
Albumin: 4 g/dL (ref 3.5–5.0)
Alkaline Phosphatase: 45 U/L (ref 38–126)
Anion gap: 7 (ref 5–15)
BUN: 37 mg/dL — ABNORMAL HIGH (ref 8–23)
CO2: 29 mmol/L (ref 22–32)
Calcium: 9.2 mg/dL (ref 8.9–10.3)
Chloride: 102 mmol/L (ref 98–111)
Creatinine: 1.52 mg/dL — ABNORMAL HIGH (ref 0.44–1.00)
GFR, Estimated: 33 mL/min — ABNORMAL LOW (ref >60)
Glucose, Bld: 65 mg/dL — ABNORMAL LOW (ref 70–99)
Potassium: 4.1 mmol/L (ref 3.5–5.1)
Sodium: 138 mmol/L (ref 135–145)
Total Bilirubin: 0.7 mg/dL (ref 0.3–1.2)
Total Protein: 7.5 g/dL (ref 6.5–8.1)

## 2023-03-05 LAB — SAMPLE TO BLOOD BANK

## 2023-03-05 LAB — TOTAL PROTEIN, URINE DIPSTICK: Protein, ur: NEGATIVE mg/dL

## 2023-03-05 MED ORDER — METOLAZONE 2.5 MG PO TABS: 2.5000 mg | ORAL_TABLET | Freq: Every day | ORAL | 0 refills | Status: DC

## 2023-03-05 NOTE — Progress Notes (Signed)
ADVANCED HF CLINIC NOTE   ID:  DHYANI FLOCKHART, DOB [birth year], MRN [id number]  Type of Visit: Established patient  PCP:  [name], MD    Cardiologist:  Sherryl Manges, MD Primary HF: Dr. Shirlee Latch   History of Present Illness: 89 y.o. with history of permanent atrial fibrillation, chronic diastolic CHF, CKD stage 3, smoking/prior COPD, renal cell CA s/p right nephrectomy.  She has a prior history of HFrEF with nonischemic cardiomyopathy and had a Medtronic ICD placed.  However, subsequently her EF has recovered.  More recently, she has had symptomatic HFpEF.    6/20, she had left hip ORIF after mechanical fall.  Echo in 6/20 showed EF 60-65%, mild RV dilation with normal systolic function, PASP 93 mmHg.  After she got home from the hospital stay post-ORIF, she noted marked peripheral edema.  She was short of breath walking short distances.  +orthopnea.  She came back to the hospital on 11/04/18 and was admitted with volume overload.  She was diuresed aggressively and lost about 22 lbs.  RHC showed pulmonary venous hypertension.  PCWP tracing had prominent v-waves in absence of significant MR, suggesting significant diastolic dysfunction.   She has been noted to have significant Fe deficiency anemia, but FOBT was negative during 7/20 hospitalization.   Admitted 11/20 with symptomatic anemia, GI workup concerning for ischemic bowel.  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 Durwin Nora with VVS and conservative management recommended.  Capsule endoscopy showed no definite source of bleeding.  She was volume overloaded due to significant RV dysfunction and diuresed, she also developed AKI on CKD stage 3.      10/22, she had bilateral CIA stents placed. ABIs in 11/22 showed patent stents.  She had a skin cancer removed from her left leg. 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   Echo 2/23 EF 65-70% with mild LVH, D-shaped septum with  moderate RV enlargement, and mildly decreased RV function, PASP 92, moderate-severe TR, dilated IVC, moderate aortic stenosis.  Echo in 10/23 showed EF 60-65%, D-shaped septum, mild RV enlargement with normal RV systolic function, PASP 90, moderate-severe TR, mild AS, IVC dilated.   Patient was found to have a neuroendocrine tumor of the pancreatic head in the fall of 2023.  She has been treated with sandostatin monthly.  She also had further GI bleeding from small bowel AVMs in fall 2023.  She also had COVID-19 in 10/23 and has been on home oxygen 2L since that time.   Follow up 2/24, NYHA II-III chronic symptoms, volume overloaded. Torsemide increased to 80 mg daily, Farxiga added.   Follow up with Dr. Graciela Husbands, long discussion about EOL. Given DNR form and high-voltage therapies inactivated on ICD.  Diagnosed with shingles 09/02/22. Treated with Valtrex x1 week.   Admit 07/24 with acute on chronic blood loss anemia 2/2 GI bleed. Transfused and given IV iron. Had small bowel enteroscopy which showed 2 nonbleeding angiodysplastic lesions in duodenum and 3 nonbleeding angioplastic lesions in jejunum treated with APC.  Admitted early 9/24 with acute on chronic anemia suspected 2/2 chronic GI blood loss. Hgb down to 6.8. Received 2 u RBCs. GI did not recommend any endoscopic procedures d/t advanced age and comorbidities. Course further c/b acute on chronic CHF. She was diuresed with IV lasix and metolazone. Echo during admit with LVEF 65-70%, D-shaped septum, mildly reduced RV, RVSP 103 mmHg, biatrial enlargement, severe TR, moderate mitral stenosis with mean gradient  of 6 mmHg, dilated IVC.  She was seen back in the [facility] on 01/30/23 for post hospital f/u and noted recurrent melena, SOB and fatigued w/ minimal exertion. Also noted to have recurrent volume overload w/ marked return of LEE. Hgb was down to 6.6. She was sent back to the ED and readmitted again for a/c CHF and GIB, treated w/ blood transfusions,  IV Fe and diuretics. Discharged on 02/06/23 on torsemide 100 mg bid.   Today she returns for post hospital HF follow up with her niece. Overall feeling fine. Started swelling yesterday in legs, weight up 4 lbs. Remains on 2L home oxygen. Has occasional dark stools. She has bendopnea and SOB walking short distances on flat ground. She is dizzy, has CP off and on occasionally when she uses her left arm. Denies PND/Orthopnea. Appetite ok. No fever or chills. Weight at home 131-132 pounds. Taking all medications. Follows closely at [facility], gets labs frequently.   ECG (personally reviewed): patient left without getting ECG  Medtronic device interrogation (personally reviewed): OptiVol up, thoracic impedence down, 80.7% V pacing, 0.1 hr/day activity, no AF   Cardiomems on 02/13/23: 29, goal 17    Labs (10/23): magnesium 1.7 Labs (3/24): TSH 2.93 Labs (4/24): K 3.9, creatinine 1.69, ALT 9, hgb 10, Plt 175 Labs (8/24): K 4.5, creatinine, 1.6 Labs (10/24): K 4.1, creatinine 1.55, Hgb 7.6  PMH: 1. COPD 2. Renal cell carcinoma: S/p right kidney resection.  3. CKD stage 3 4. Fe deficiency anemia 5. Atrial fibrillation: Permanent. She has failed amiodarone, Tikosyn, and flecainide as well as multiple cardioversions. 6. Chronic diastolic CHF: She has history of prior HFrEF with nonischemic cardiomyopathy.  She has a Medtronic ICD.   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  - More recently, EF has been in normal range but she has had diastolic CHF.  - Echo (6/20): EF 60-65%, mildly dilated RV with PASP 93 mmHg, mild-moderate TR.  - RHC (7/20): mean RA 11, PA 64/18 mean 39, mean PCWP 24 with v waves to 41, CI 4.21, PVR 1.9 WU.  - Cardiomems placement - RHC (11/20): mean RA 13, PA 63/23 mean 25 with v waves to 47 (MR only mild by echo), mean PCWP 25, CI 3.18, PVR 2.6 WU - Echo (11/21): EF 65-70%, moderate LVH, D-shaped septum with mildly decreased RV systolic function, RVSP 85.9 mmHg, severe RAE,  severe TR, mod-sev AS. - Echo (2/23): EF 65-70% with mild LVH, D-shaped septum with moderate RV enlargement, and mildly decreased RV function, PASP 92, moderate-severe TR, dilated IVC, moderate aortic stenosis.  - Echo (10/23): EF 60-65%, D-shaped septum, mild RV enlargement with normal RV systolic function, PASP 90, moderate-severe TR, mild AS, IVC dilated.  - Echo (9/24): LVEF 65-70%, D-shaped septum, mildly reduced RV, RVSP 103 mmHg, biatrial enlargement, severe TR, moderate mitral stenosis with mean gradient of 6 mmHg, dilated IVC 7. Hyperlipidemia 8. Carotid stenosis: Carotid dopplers (3/19) with 40-59% LICA stenosis.   9. Possible ischemic bowel in 11/20 10. 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 Conservative management per Dr. Durwin Nora.  - 10/22 bilateral CIA stents.  - 11/22 ABIs: 1.07 left, 0.83 right with patent CIA stents.  11. 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  12. Squamous cell skin cancer.  13. Aortic stenosis: Mild on 10/23 echo.  14. 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  15. COVID 19 in 10/23       Past  Medical History:  Diagnosis Date   Actinic keratosis 01/17/2015    R forearm   Adjustment disorder with anxiety     Anemia     Anginal pain (HCC)     Arthritis      "some in my hands" (11/11/2012)   Atrial fibrillation (HCC)     Atypical mole 03/25/2018    L forearm - severe   Automatic implantable cardioverter-defibrillator in situ     Avascular necrosis of hip (HCC) 05/03/2011   Blood in stool     Carotid artery stenosis 09/2007    a. 09/2007: 60-79% bilateral (stable); b. 10/2008: 40-59% R 60-79%    Cellulitis of left lower extremity 07/13/2020   Chest pain, unspecified     Chronic airway obstruction, not elsewhere classified     Chronic bronchitis (HCC)      "get it some; not q year" (11/11/2012)   Chronic diastolic CHF (congestive heart failure) (HCC) 06/04/2013    Chronic kidney disease, unspecified     Coronary artery disease      non-obstructive by 2006 cath   COVID-19 virus infection 02/12/2022   Displaced fracture of left femoral neck (HCC) 10/09/2018   Frequent UTI      "get them a couple times/yr" (11/11/2012)   GI bleed 03/28/2020   Hemorrhage of rectum and anus     High cholesterol     History of blood transfusion 04/2011    "after hip OR" (11/11/2012)   Hx of renal cell cancer     Hypertension 05/20/2011   Hypertr obst cardiomyop     Hypotension, unspecified      cardiac cath 2006..nonobstructive CAD 30-40s lesions.Marland KitchenETT 1/09 nondiagnostic due to poor HR response..Right Renal Cancer 2003   Long term (current) use of anticoagulants     Malignant neoplasm of kidney, except pelvis     Osteoarthritis of right hip     Other and unspecified coagulation defects     PONV (postoperative nausea and vomiting)     Presence of permanent cardiac pacemaker     RECTAL BLEEDING 10/13/2009    Qualifier: Diagnosis of  By: Wilmon Pali NP, Paula     Renal cancer (HCC) 06/2001    Right   Secondary cardiomyopathy, unspecified     Sinus bradycardia     Squamous cell carcinoma of skin 01/17/2015    R lat wrist   Squamous cell carcinoma of skin 01/29/2018    R post upper leg - superficially invasive   Squamous cell carcinoma of skin 03/25/2018    L lat foot   Squamous cell carcinoma of skin 11/09/2020    left lat foot - EDC 01/01/21, recurrent 01/31/21 - MOHs 02/22/21   Squamous cell carcinoma of skin 12/19/2021    Right Posterior Medial Thigh, EDC   Squamous cell carcinoma of skin 12/19/2021    SCC IS, L lat heel, EDC 02/04/2022   Squamous cell carcinoma of skin 01/21/2022    L forearm, EDC 02/04/2022   Squamous cell carcinoma of skin 01/21/2022    R lower leg below knee, EDC 02/04/2022   Squamous cell carcinoma of skin 01/21/2022    SCCIS, R post heel, EDC 02/04/2022   Squamous cell carcinoma of skin 07/01/2022    left lower abdomen, in situ, EDC    Squamous cell carcinoma of skin 07/01/2022    left medial chest, EDC   Urge incontinence           Past Surgical History:  Procedure Laterality Date  ABDOMINAL AORTOGRAM W/LOWER EXTREMITY N/A 02/12/2021    Procedure: ABDOMINAL AORTOGRAM W/LOWER EXTREMITY;  Surgeon: Maeola Harman, MD;  Location: [facility] INVASIVE CV LAB;  Service: Cardiovascular;  Laterality: N/A;   ABDOMINAL HYSTERECTOMY   1975    for benign causes   APPENDECTOMY       BI-VENTRICULAR PACEMAKER UPGRADE   05/04/2010   BIOPSY   02/28/2019    Procedure: BIOPSY;  Surgeon: Rachael Fee, MD;  Location: [facility] ENDOSCOPY;  Service: Endoscopy;;   BIOPSY   03/04/2022    Procedure: BIOPSY;  Surgeon: Lemar Lofty., MD;  Location: [facility] ENDOSCOPY;  Service: Gastroenterology;;   CARDIAC CATHETERIZATION   2006   CARDIOVERSION N/A 02/20/2018    Procedure: CARDIOVERSION;  Surgeon: Antonieta Iba, MD;  Location: [facility] ORS;  Service: Cardiovascular;  Laterality: N/A;   CARDIOVERSION N/A 03/27/2018    Procedure: CARDIOVERSION;  Surgeon: Antonieta Iba, MD;  Location: [facility] ORS;  Service: Cardiovascular;  Laterality: N/A;   CATARACT EXTRACTION W/ INTRAOCULAR LENS  IMPLANT, BILATERAL   01/2006-02-2006   CHOLECYSTECTOMY N/A 11/11/2012    Procedure: LAPAROSCOPIC CHOLECYSTECTOMY WITH INTRAOPERATIVE CHOLANGIOGRAM;  Surgeon: Wilmon Arms. Corliss Skains, MD;  Location: [facility] OR;  Service: General;  Laterality: N/A;   COLONOSCOPY WITH PROPOFOL N/A 02/28/2019    Procedure: COLONOSCOPY WITH PROPOFOL;  Surgeon: Rachael Fee, MD;  Location: [facility] ENDOSCOPY;  Service: Endoscopy;  Laterality: N/A;   ENTEROSCOPY N/A 03/30/2020    Procedure: ENTEROSCOPY;  Surgeon: Shellia Cleverly, DO;  Location: [facility] ENDOSCOPY;  Service: Gastroenterology;  Laterality: N/A;   ENTEROSCOPY N/A 02/10/2021    Procedure: ENTEROSCOPY;  Surgeon: Jeani Hawking, MD;  Location: [facility] ENDOSCOPY;  Service: Endoscopy;  Laterality: N/A;   ENTEROSCOPY N/A 02/15/2022    Procedure:  ENTEROSCOPY;  Surgeon: Imogene Burn, MD;  Location: [facility] ENDOSCOPY;  Service: Gastroenterology;  Laterality: N/A;   EP IMPLANTABLE DEVICE N/A 02/21/2016    Procedure: ICD Generator Changeout;  Surgeon: Duke Salvia, MD;  Location: [facility] INVASIVE CV LAB;  Service: Cardiovascular;  Laterality: N/A;   ESOPHAGOGASTRODUODENOSCOPY N/A 03/04/2022    Procedure: ESOPHAGOGASTRODUODENOSCOPY (EGD);  Surgeon: Lemar Lofty., MD;  Location: [facility] ENDOSCOPY;  Service: Gastroenterology;  Laterality: N/A;   ESOPHAGOGASTRODUODENOSCOPY (EGD) WITH PROPOFOL N/A 02/28/2019    Procedure: ESOPHAGOGASTRODUODENOSCOPY (EGD) WITH PROPOFOL;  Surgeon: Rachael Fee, MD;  Location: [facility] ENDOSCOPY;  Service: Endoscopy;  Laterality: N/A;   EUS N/A 03/04/2022    Procedure: UPPER ENDOSCOPIC ULTRASOUND (EUS) RADIAL;  Surgeon: Lemar Lofty., MD;  Location: [facility] ENDOSCOPY;  Service: Gastroenterology;  Laterality: N/A;   FINE NEEDLE ASPIRATION N/A 03/04/2022    Procedure: FINE NEEDLE ASPIRATION (FNA) LINEAR;  Surgeon: Lemar Lofty., MD;  Location: [facility] ENDOSCOPY;  Service: Gastroenterology;  Laterality: N/A;   GIVENS CAPSULE STUDY N/A 03/15/2019    Procedure: GIVENS CAPSULE STUDY;  Surgeon: Tressia Danas, MD;  Location: [facility] ENDOSCOPY;  Service: Gastroenterology;  Laterality: N/A;   HOT HEMOSTASIS N/A 03/30/2020    Procedure: HOT HEMOSTASIS (ARGON PLASMA COAGULATION/BICAP);  Surgeon: Shellia Cleverly, DO;  Location: [facility] ENDOSCOPY;  Service: Gastroenterology;  Laterality: N/A;   HOT HEMOSTASIS N/A 02/10/2021    Procedure: HOT HEMOSTASIS (ARGON PLASMA COAGULATION/BICAP);  Surgeon: Jeani Hawking, MD;  Location: [facility] ENDOSCOPY;  Service: Endoscopy;  Laterality: N/A;   HOT HEMOSTASIS N/A 02/15/2022    Procedure: HOT HEMOSTASIS (ARGON PLASMA COAGULATION/BICAP);  Surgeon: Imogene Burn, MD;  Location: [facility] ENDOSCOPY;  Service: Gastroenterology;  Laterality: N/A;   INSERT / REPLACE / REMOVE PACEMAKER  05-01-11     02-28-05-/05-04-10-ICD-MEDTRONIC MAXIMAL DR   IR ANGIOGRAM SELECTIVE EACH ADDITIONAL VESSEL   02/18/2022   IR ANGIOGRAM SELECTIVE EACH ADDITIONAL VESSEL   02/18/2022   IR ANGIOGRAM VISCERAL SELECTIVE   02/16/2022   IR ANGIOGRAM VISCERAL SELECTIVE   02/16/2022   IR EMBO ART  VEN HEMORR LYMPH EXTRAV  INC GUIDE ROADMAPPING   02/16/2022   IR THORACENTESIS ASP PLEURAL SPACE W/IMG GUIDE   02/21/2022   IR THORACENTESIS ASP PLEURAL SPACE W/IMG GUIDE   02/22/2022   IR US GUIDE VASC ACCESS RIGHT   02/16/2022   JOINT REPLACEMENT       LAPAROSCOPIC CHOLECYSTECTOMY   11/11/2012   LAPAROSCOPIC LYSIS OF ADHESIONS N/A 11/11/2012    Procedure: LAPAROSCOPIC LYSIS OF ADHESIONS;  Surgeon: Wilmon Arms. Corliss Skains, MD;  Location: [facility] OR;  Service: General;  Laterality: N/A;   NEPHRECTOMY Right 06/2001     S/P RENAL CELL CANCER   PERIPHERAL VASCULAR INTERVENTION Bilateral 02/12/2021    Procedure: PERIPHERAL VASCULAR INTERVENTION;  Surgeon: Maeola Harman, MD;  Location: [facility] INVASIVE CV LAB;  Service: Cardiovascular;  Laterality: Bilateral;  Iliac artery stents   PRESSURE SENSOR/CARDIOMEMS N/A 02/03/2019    Procedure: PRESSURE SENSOR/CARDIOMEMS;  Surgeon: Laurey Morale, MD;  Location: [facility] INVASIVE CV LAB;  Service: Cardiovascular;  Laterality: N/A;   RIGHT HEART CATH N/A 11/09/2018    Procedure: RIGHT HEART CATH;  Surgeon: Laurey Morale, MD;  Location: [facility] INVASIVE CV LAB;  Service: Cardiovascular;  Laterality: N/A;   RIGHT HEART CATH N/A 03/08/2019    Procedure: RIGHT HEART CATH;  Surgeon: Laurey Morale, MD;  Location: [facility] INVASIVE CV LAB;  Service: Cardiovascular;  Laterality: N/A;   SUBMUCOSAL TATTOO INJECTION   02/28/2019    Procedure: SUBMUCOSAL TATTOO INJECTION;  Surgeon: Rachael Fee, MD;  Location: [facility] ENDOSCOPY;  Service: Endoscopy;;   TOTAL HIP ARTHROPLASTY Right 05/03/2011    Procedure: TOTAL HIP ARTHROPLASTY ANTERIOR APPROACH;  Surgeon: Kathryne Hitch;  Location: [facility] ORS;  Service:  Orthopedics;  Laterality: Right;  Removal of Cannulated Screws Right Hip, Right Direct Anterior Hip Replacement   TOTAL HIP ARTHROPLASTY Left 10/09/2018    Procedure: TOTAL HIP ARTHROPLASTY ANTERIOR APPROACH;  Surgeon: Samson Frederic, MD;  Location: [facility] OR;  Service: Orthopedics;  Laterality: Left;    Current Outpatient Medications on File Prior to Encounter  Medication Sig Dispense Refill   acetaminophen (TYLENOL) 500 MG tablet Take 1,000 mg by mouth daily.     Ascorbic Acid (VITAMIN C PO) Take 1 tablet by mouth daily.     Cholecalciferol (VITAMIN D-3 PO) Take 2 capsules by mouth daily.     Cyanocobalamin (B-12 PO) Take 1 tablet by mouth daily.     ferrous gluconate (FERGON) 324 MG tablet Take 1 tablet (324 mg total) by mouth daily with breakfast. 30 tablet 3   fluticasone (FLONASE) 50 MCG/ACT nasal spray Place 2 sprays into both nostrils daily as needed for allergies or rhinitis. 16 g 0   Multiple Vitamins-Minerals (HAIR SKIN NAILS PO) Take 1 capsule by mouth daily.     pantoprazole (PROTONIX) 40 MG tablet Take 1 tablet (40 mg total) by mouth daily. 30 tablet 3   potassium chloride SA (KLOR-CON M) 20 MEQ tablet Take 2 tablets (40 mEq total) by mouth daily AND 1 tablet (20 mEq total) at bedtime.     rosuvastatin (CRESTOR) 40 MG tablet Take 1 tablet (40 mg total) by mouth daily. (Patient taking differently: Take 40 mg by mouth  every evening.) 30 tablet 11   torsemide (DEMADEX) 20 MG tablet Take 5 tablets (100 mg total) by mouth 2 (two) times daily.     No current facility-administered medications on file prior to encounter.     Allergies:   Dilaudid [hydromorphone], Fentanyl, Codeine, Morphine and related, Amoxicillin, Hydrocodone, Oxycodone, Tramadol, Whole blood, and Sulfonamide derivatives    Social History:  The patient  reports that she quit smoking about 21 years ago. Her smoking use included cigarettes. She has a 20.00 pack-year smoking history. She has never used smokeless tobacco. She  reports that she does not drink alcohol and does not use drugs.    Family History:  The patient's family history includes Breast cancer in her cousin and another family member; Breast cancer (age of onset: 71) in her maternal aunt; Early death in her father; Heart failure in her mother.    ROS:  Please see the history of present illness.   All other systems are personally reviewed and negative.    BP 128/60   Pulse 60   Wt 61.7 kg (136 lb)   SpO2 99%   BMI 21.95 kg/m   Wt Readings from Last 3 Encounters:  03/05/23 61.7 kg (136 lb)  02/26/23 59.9 kg (132 lb 1.6 oz)  02/24/23 60 kg (132 lb 6 oz)   PHYSICAL EXAM: General:  NAD. No resp difficulty, arrived in [facility] on oxygen, chronically-ill appearing HEENT: Normal Neck: Supple. No JVD. Carotids 2+ bilat; no bruits. No lymphadenopathy or thryomegaly appreciated. Cor: PMI nondisplaced. Irregular rate & rhythm. No rubs, gallops, 3/6 AS clear S2 Lungs: Clear Abdomen: Soft, nontender, nondistended. No hepatosplenomegaly. No bruits or masses. Good bowel sounds. Extremities: No cyanosis, clubbing, rash, 2-3+ BLE edema Neuro: Alert & oriented x 3, cranial nerves grossly intact. Moves all 4 extremities w/o difficulty. Affect pleasant.  ASSESSMENT AND PLAN: 1. Chronic diastolic CHF: With prominent RV failure.  Echo in 6/20 showed EF 60-65%, mild RV dilation with normal systolic function, PASP 93 mmHg.  She had remote nonischemic cardiomyopathy with recovery of EF, has Medtronic ICD.  I suspect that there is significant RV dysfunction. Atrial fibrillation likely contributes, but this appears to be permanent now as she has failed multiple anti-arrhythmics and is reasonably rate-controlled. RHC 11/20 showed elevated filling pressures and pulmonary venous hypertension.  Prominent v-waves on PCWP tracing likely due to stiff ventricle/diastolic dysfunction as she had only mild MR on echo.  Echo 10/23 with EF 60-65%, D-shaped septum, mild RV enlargement with  normal RV systolic function, PASP 90, moderate-severe TR, mild AS, IVC dilated. Echo (9/24): LVEF 65-70%, D-shaped septum, mildly reduced RV, RVSP 103 mmHg, biatrial enlargement, severe TR, mild AS with mean gradient of 17 mmhg, moderate mitral stenosis with mean gradient of 6 mmHg, dilated IVC. She is RV pacing at a high percentage, but LV EF has remained normal. NYHA III-IIIb, she is volume overloaded today. - Give metolazone 2.5 mg + extra 40 KCL daily x 3 days. Labs today reviewed and are stable, K 4.1, SCr 1.52 - Continue torsemide 100 mg bid + 40/20 KCL - Off SGLT2i after UTIs - Cardiomems pillow not working. I asked her to call company to troubleshoot. - Next step will be Furoscix. May ultimately need weekly metolazone  2. Hypertension: BP controlled.  3. Pulmonary hypertension: PA systolic pressure estimated 103 mmHg on last echo in setting of volume overload. Based on RHC 11/20, she has primarily pulmonary venous hypertension, not candidate for pulmonary vasodilators.  4. CKD:  Stage 4. Off SGLT2i - Labs today showed Scr 1.52 5. Chronic anemia/GI bleeding: Due to small bowel AVMs.  - 2 recent admissions for GI bleeding - She is being followed by Heme/Onc for her anemia.  - She had labs today, may need transfusion tomorrow. If so, would give IV Lasix 40 mg after transfusion. 6. Atrial fibrillation: Permanent.  Rate control is reasonable. She has failed amiodarone, Tikosyn, and flecainide as well as multiple cardioversions.  - Off Eliquis with recurrent bleeding 7. Aortic stenosis: Mild on last echo.  8. Neuroendocrine tumor of pancreatic head: Treating palliative with sandostatin.  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 Volume becoming more difficult to manage. Recurrent admissions. Dr. Graciela Husbands previously discussed EOL issues with her and her ICD high velocity therapies are de-activated.  - She has DNR.   Follow up in 7-10 days with APP. She is high-risk for re-admit.     Nelva Bush, FNP-BC 03/05/23  [facility] [address] [facility] [place] [address] [phone] (office) [phone] (fax)

## 2023-03-05 NOTE — Patient Instructions (Addendum)
Thank you for coming in today  If you had labs drawn today, any labs that are abnormal the clinic will call you No news is good news  Please call cardiomems daily   Medications: Metolazone 2.5 mg with extra 40 meq Potassium for 3 days starting 03/06/2023  Follow up appointments:  Your physician recommends that you schedule a follow-up appointment in:  7-10 days in clinic   Do the following things EVERYDAY: Weigh yourself in the morning before breakfast. Write it down and keep it in a log. Take your medicines as prescribed Eat low salt foods--Limit salt (sodium) to 2000 mg per day.  Stay as active as you can everyday Limit all fluids for the day to less than 2 liters   At the [facility], you and your health needs are our priority. As part of our continuing mission to provide you with exceptional heart care, we have created designated Provider Care Teams. These Care Teams include your primary Cardiologist (physician) and Advanced Practice Providers (APPs- Physician Assistants and Nurse Practitioners) who all work together to provide you with the care you need, when you need it.   You may see any of the following providers on your designated Care Team at your next follow up: Dr Arvilla Meres Dr Marca Ancona Dr. Marcos Eke, NP Robbie Lis, [place] [facility] [place], [place] Brynda Peon, NP Karle Plumber, PharmD   Please be sure to bring in all your medications bottles to every appointment.    Thank you for choosing [facility]  If you have any questions or concerns before your next appointment please send [place] a message through [place] or call our office at [phone].    TO LEAVE A MESSAGE FOR THE NURSE SELECT OPTION 2, PLEASE LEAVE A MESSAGE INCLUDING: YOUR NAME DATE OF BIRTH CALL BACK NUMBER REASON FOR CALL**this is important as we prioritize the call backs  YOU WILL RECEIVE A CALL  BACK THE SAME DAY AS LONG AS YOU CALL BEFORE 4:00 PM

## 2023-03-05 NOTE — Telephone Encounter (Signed)
Tried calling pt regarding today's lab results but got NO Answer so tried contacting pt's granddaughter Neysa Bonito.  Christy did not answer but was able to LVM stating pt requested if Dr. Latanya Maudlin office would give the pt a call regarding if the pt would need a blood transfusion on 03/06/2023.  Stated tried calling pt but she did not answer and voicemail box is not setup to leave a message.  Stated pt's Hbg is 8.3; therefore, pt does not need a blood transfusion on 03/06/2023.  Stated this nurse will cancel the appt.  Instructed Christy to please inform the pt of my call and to please contact Dr. Latanya Maudlin office should they have additional questions or concerns.

## 2023-03-06 ENCOUNTER — Inpatient Hospital Stay: Payer: PPO

## 2023-03-07 ENCOUNTER — Other Ambulatory Visit (HOSPITAL_COMMUNITY): Payer: Self-pay | Admitting: *Deleted

## 2023-03-07 MED ORDER — POTASSIUM CHLORIDE CRYS ER 20 MEQ PO TBCR: EXTENDED_RELEASE_TABLET | ORAL | 6 refills | Status: DC

## 2023-03-10 ENCOUNTER — Ambulatory Visit (INDEPENDENT_AMBULATORY_CARE_PROVIDER_SITE_OTHER): Payer: PPO

## 2023-03-10 ENCOUNTER — Ambulatory Visit: Payer: PPO | Admitting: Dermatology

## 2023-03-10 DIAGNOSIS — I4821 Permanent atrial fibrillation: Secondary | ICD-10-CM

## 2023-03-10 LAB — CUP PACEART REMOTE DEVICE CHECK
Battery Remaining Longevity: 17 mo
Battery Voltage: 2.9 V
Brady Statistic AP VP Percent: 0 %
Brady Statistic AP VS Percent: 0 %
Brady Statistic AS VP Percent: 0 %
Brady Statistic AS VS Percent: 0 %
Brady Statistic RA Percent Paced: 0 %
Brady Statistic RV Percent Paced: 77.25 %
Date Time Interrogation Session: 20241104022604
HighPow Impedance: 38 Ohm
HighPow Impedance: 54 Ohm
Implantable Lead Connection Status: 753985
Implantable Lead Connection Status: 753985
Implantable Lead Implant Date: 20061026
Implantable Lead Implant Date: 20111230
Implantable Lead Location: 753859
Implantable Lead Location: 753860
Implantable Lead Model: 5076
Implantable Lead Model: 6947
Implantable Pulse Generator Implant Date: 20171018
Lead Channel Impedance Value: 342 Ohm
Lead Channel Impedance Value: 418 Ohm
Lead Channel Impedance Value: 456 Ohm
Lead Channel Pacing Threshold Amplitude: 0.5 V
Lead Channel Pacing Threshold Pulse Width: 0.4 ms
Lead Channel Sensing Intrinsic Amplitude: 0.25 mV
Lead Channel Sensing Intrinsic Amplitude: 0.375 mV
Lead Channel Sensing Intrinsic Amplitude: 9 mV
Lead Channel Sensing Intrinsic Amplitude: 9 mV
Lead Channel Setting Pacing Amplitude: 2.5 V
Lead Channel Setting Pacing Pulse Width: 0.4 ms
Lead Channel Setting Sensing Sensitivity: 0.3 mV
Zone Setting Status: 755011

## 2023-03-13 ENCOUNTER — Encounter: Payer: Self-pay | Admitting: Oncology

## 2023-03-13 ENCOUNTER — Inpatient Hospital Stay: Payer: PPO | Attending: Nurse Practitioner | Admitting: Hematology

## 2023-03-13 ENCOUNTER — Inpatient Hospital Stay: Payer: PPO

## 2023-03-13 ENCOUNTER — Encounter: Payer: Self-pay | Admitting: Hematology

## 2023-03-13 ENCOUNTER — Other Ambulatory Visit: Payer: Self-pay

## 2023-03-13 VITALS — BP 133/49 | HR 59 | Resp 16

## 2023-03-13 VITALS — BP 137/53 | HR 60 | Temp 97.4°F | Resp 16 | Wt 132.7 lb

## 2023-03-13 DIAGNOSIS — M7989 Other specified soft tissue disorders: Secondary | ICD-10-CM | POA: Diagnosis not present

## 2023-03-13 DIAGNOSIS — N184 Chronic kidney disease, stage 4 (severe): Secondary | ICD-10-CM | POA: Diagnosis not present

## 2023-03-13 DIAGNOSIS — I083 Combined rheumatic disorders of mitral, aortic and tricuspid valves: Secondary | ICD-10-CM | POA: Insufficient documentation

## 2023-03-13 DIAGNOSIS — D3A8 Other benign neuroendocrine tumors: Secondary | ICD-10-CM | POA: Diagnosis not present

## 2023-03-13 DIAGNOSIS — I4821 Permanent atrial fibrillation: Secondary | ICD-10-CM | POA: Insufficient documentation

## 2023-03-13 DIAGNOSIS — D631 Anemia in chronic kidney disease: Secondary | ICD-10-CM | POA: Diagnosis not present

## 2023-03-13 DIAGNOSIS — N1832 Chronic kidney disease, stage 3b: Secondary | ICD-10-CM | POA: Insufficient documentation

## 2023-03-13 DIAGNOSIS — I5032 Chronic diastolic (congestive) heart failure: Secondary | ICD-10-CM | POA: Diagnosis not present

## 2023-03-13 DIAGNOSIS — Z87891 Personal history of nicotine dependence: Secondary | ICD-10-CM | POA: Insufficient documentation

## 2023-03-13 DIAGNOSIS — D500 Iron deficiency anemia secondary to blood loss (chronic): Secondary | ICD-10-CM | POA: Diagnosis not present

## 2023-03-13 DIAGNOSIS — D62 Acute posthemorrhagic anemia: Secondary | ICD-10-CM | POA: Insufficient documentation

## 2023-03-13 DIAGNOSIS — Z95810 Presence of automatic (implantable) cardiac defibrillator: Secondary | ICD-10-CM | POA: Diagnosis not present

## 2023-03-13 DIAGNOSIS — D649 Anemia, unspecified: Secondary | ICD-10-CM

## 2023-03-13 DIAGNOSIS — Z66 Do not resuscitate: Secondary | ICD-10-CM | POA: Insufficient documentation

## 2023-03-13 DIAGNOSIS — Z5112 Encounter for antineoplastic immunotherapy: Secondary | ICD-10-CM | POA: Diagnosis not present

## 2023-03-13 DIAGNOSIS — I428 Other cardiomyopathies: Secondary | ICD-10-CM | POA: Insufficient documentation

## 2023-03-13 DIAGNOSIS — Z9981 Dependence on supplemental oxygen: Secondary | ICD-10-CM | POA: Insufficient documentation

## 2023-03-13 DIAGNOSIS — J449 Chronic obstructive pulmonary disease, unspecified: Secondary | ICD-10-CM | POA: Diagnosis not present

## 2023-03-13 DIAGNOSIS — Z79899 Other long term (current) drug therapy: Secondary | ICD-10-CM | POA: Insufficient documentation

## 2023-03-13 DIAGNOSIS — Z8616 Personal history of COVID-19: Secondary | ICD-10-CM | POA: Diagnosis not present

## 2023-03-13 DIAGNOSIS — I130 Hypertensive heart and chronic kidney disease with heart failure and stage 1 through stage 4 chronic kidney disease, or unspecified chronic kidney disease: Secondary | ICD-10-CM | POA: Diagnosis not present

## 2023-03-13 DIAGNOSIS — K5520 Angiodysplasia of colon without hemorrhage: Secondary | ICD-10-CM

## 2023-03-13 DIAGNOSIS — I272 Pulmonary hypertension, unspecified: Secondary | ICD-10-CM | POA: Diagnosis not present

## 2023-03-13 LAB — CBC WITH DIFFERENTIAL (CANCER CENTER ONLY)
Abs Immature Granulocytes: 0.02 10*3/uL (ref 0.00–0.07)
Basophils Absolute: 0 10*3/uL (ref 0.0–0.1)
Basophils Relative: 1 %
Eosinophils Absolute: 0 10*3/uL (ref 0.0–0.5)
Eosinophils Relative: 0 %
HCT: 28.5 % — ABNORMAL LOW (ref 36.0–46.0)
Hemoglobin: 8.4 g/dL — ABNORMAL LOW (ref 12.0–15.0)
Immature Granulocytes: 1 %
Lymphocytes Relative: 14 %
Lymphs Abs: 0.4 10*3/uL — ABNORMAL LOW (ref 0.7–4.0)
MCH: 26.8 pg (ref 26.0–34.0)
MCHC: 29.5 g/dL — ABNORMAL LOW (ref 30.0–36.0)
MCV: 90.8 fL (ref 80.0–100.0)
Monocytes Absolute: 0.5 10*3/uL (ref 0.1–1.0)
Monocytes Relative: 16 %
Neutro Abs: 2.2 10*3/uL (ref 1.7–7.7)
Neutrophils Relative %: 68 %
Platelet Count: 169 10*3/uL (ref 150–400)
RBC: 3.14 MIL/uL — ABNORMAL LOW (ref 3.87–5.11)
RDW: 17.7 % — ABNORMAL HIGH (ref 11.5–15.5)
WBC Count: 3.1 10*3/uL — ABNORMAL LOW (ref 4.0–10.5)
nRBC: 0 % (ref 0.0–0.2)

## 2023-03-13 LAB — CMP (CANCER CENTER ONLY)
ALT: 7 U/L (ref 0–44)
AST: 15 U/L (ref 15–41)
Albumin: 4 g/dL (ref 3.5–5.0)
Alkaline Phosphatase: 48 U/L (ref 38–126)
Anion gap: 6 (ref 5–15)
BUN: 51 mg/dL — ABNORMAL HIGH (ref 8–23)
CO2: 34 mmol/L — ABNORMAL HIGH (ref 22–32)
Calcium: 9.3 mg/dL (ref 8.9–10.3)
Chloride: 99 mmol/L (ref 98–111)
Creatinine: 1.52 mg/dL — ABNORMAL HIGH (ref 0.44–1.00)
GFR, Estimated: 33 mL/min — ABNORMAL LOW (ref >60)
Glucose, Bld: 75 mg/dL (ref 70–99)
Potassium: 4.6 mmol/L (ref 3.5–5.1)
Sodium: 139 mmol/L (ref 135–145)
Total Bilirubin: 0.5 mg/dL (ref <1.2)
Total Protein: 7.8 g/dL (ref 6.5–8.1)

## 2023-03-13 LAB — SAMPLE TO BLOOD BANK

## 2023-03-13 MED ORDER — SODIUM CHLORIDE 0.9 % IV SOLN
5.0000 mg/kg | Freq: Once | INTRAVENOUS | Status: AC
Start: 2023-03-13 — End: 2023-03-13
  Administered 2023-03-13: 300 mg via INTRAVENOUS
  Filled 2023-03-13: qty 12

## 2023-03-13 MED ORDER — OCTREOTIDE ACETATE 20 MG IM KIT
20.0000 mg | PACK | Freq: Once | INTRAMUSCULAR | Status: AC
  Administered 2023-03-13: 20 mg via INTRAMUSCULAR
  Filled 2023-03-13: qty 1

## 2023-03-13 MED ORDER — SODIUM CHLORIDE 0.9 % IV SOLN: Freq: Once | INTRAVENOUS | Status: AC

## 2023-03-13 MED ORDER — EPOETIN ALFA-EPBX 20000 UNIT/ML IJ SOLN
20000.0000 [IU] | Freq: Once | INTRAMUSCULAR | Status: AC
Start: 2023-03-13 — End: 2023-03-13
  Administered 2023-03-13: 20000 [IU] via SUBCUTANEOUS
  Filled 2023-03-13: qty 1

## 2023-03-13 NOTE — Progress Notes (Signed)
[facility]   [phone] [phone]   Clinic Follow up Note   Patient Care Team: [name], MD as PCP - General (Family Medicine) Duke Salvia, MD as PCP - Cardiology (Cardiology) Laurey Morale, MD as PCP - Advanced Heart Failure (Cardiology) Antonieta Iba, MD as Consulting Physician (Cardiology) Duke Salvia, MD as Consulting Physician (Cardiology) [name], Latina Craver, MD as Consulting Physician (Vascular Surgery) Creig Hines, MD as Consulting Physician (Hematology and Oncology) Malachy Mood, MD as Consulting Physician (Oncology) Kathyrn Sheriff, [facility] (Inactive) as Pharmacist (Pharmacist)  Date of Service:  03/13/2023  CHIEF COMPLAINT: f/u of anemia and neuroendocrine tumor  CURRENT THERAPY:  Bevacizumab every 2 weeks Sandostatin injection every 4 weeks  Oncology History   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   -Outpatient EUS 03/04/2022 by Dr. 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 Freida Busman did not recommend surgery in multifocal NET and also due to her age and co-morbidities. -She began first line sandostatin injections q28 days on 03/27/22.  -Dotatate PET scan, done 2 weeks after first injection 12/6, shows no significant tracer avidity in the pancrease or distant sites. Findings are likely due to close timing relative to the sandostatin injection blocking receptors. When we repeat this for restaging next year, will arrange 4 weeks after  sandostatin -She has persistent anemia, likely secondary to GI bleeding from the tumor and AVM, she has had multiple hospital admission for anemia and bleeding.     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 -She previously had reaction to blood transfusion, or required multiple premedications for blood transfusion -She was recently hospitalized for anemia and a GI bleeding in July 2024. -She is currently on ESA every 2 weeks, IV iron as needed, and blood transfusion as needed. -Due to her multiple hospital admission for anemia and GI bleeding, she started bevacizumab for AVM on 10/9, plan to give every 2 weeksX4   Assessment and Plan    Metastatic Neuroendocrine Tumor -Stable, continue Sandostatin injection monthly  Chronic GI bleeding and anemia Patient reports feeling weak recently. No recent evidence of GI bleeding. Stable hemoglobin levels (8.3-8.4) since last blood transfusion in October. Currently on third dose of Bevacizumab. -Continue Bevacizumab infusion today. -Plan for fourth dose of Bevacizumab in two weeks. -Consider repeating Bevacizumab treatment if patient responds but bleeding recurs.  Congestive Heart Failure Recent episode of leg swelling due to missed Lasix dose post blood product infusion. Currently on diuretic medication to manage fluid retention. -Ensure Lasix 20mg  is administered post blood product infusion. -Continue current diuretic medication as prescribed by congestive heart failure clinic.  General Health Maintenance -Continue premedication regimen (Benadryl 50mg  oral, Pepcid 20mg , Silanetriol 125mg , Tylenol 650mg , Lasix 20mg )  prior to  infusions. -Check blood counts next week. If stable, no need for blood transfusion. -Follow-up appointment in two weeks.       Discussed the use of AI scribe software for clinical note transcription with the patient, who gave verbal consent to proceed.  History of Present Illness   The patient, an 89 year old female with a history of metastatic neuroendocrine tumor and gastrointestinal bleeding, presents for follow-up. She reports feeling weak in the past few days but has not noticed any recent blood in her stool. The last time she noticed blood in her stool was approximately two weeks ago, around the time of her last visit. Her blood count has remained stable, with the last reading at 8.3, down slightly from 9.0 after her last blood transfusion in October.  The patient has also been dealing with swelling in her legs due to congestive heart failure. She has been taking a fluid pill to manage this, but there was a recent oversight where she did not receive her usual dose of Lasix after her last infusion. This resulted in increased leg swelling and a visit to the congestive heart failure clinic, where she was given additional medication to help remove the excess fluid. This has left her feeling weak.  The patient is also due for an injection of bevacizumab, a medication used to shrink blood vessels and decrease bleeding. She has received two doses so far and is scheduled for her third dose today. She reports feeling dizzy for about four days after her last dose, but her blood pressure remains stable.         All other systems were reviewed with the patient and are negative.  MEDICAL HISTORY:  Past Medical History:  Diagnosis Date   (HFpEF) heart failure with preserved ejection fraction (HCC)    EF=60-60%   Actinic keratosis 01/17/2015   R forearm   Adjustment disorder with anxiety    Adverse effect of other narcotics, sequela    Intolerance to all narcotics   Anti-Duffy antibodies present     Aortic valve stenosis    Arthritis    "some in my hands" (11/11/2012)   Atrial fibrillation, permanent (HCC)    Eliquis   Atypical mole 03/25/2018   L forearm - severe   Automatic implantable cardioverter-defibrillator in situ    Avascular necrosis of hip (HCC) 05/03/2011   Carotid artery stenosis 09/2007   a. 09/2007: 60-79% bilateral (stable); b. 10/2008: 40-59% R 60-79%    Cellulitis of left lower extremity 07/13/2020   CKD (chronic kidney disease), stage III (HCC)    COPD (chronic obstructive pulmonary disease) (HCC)    Coronary artery disease    non-obstructive by 2006 cath   COVID-19 virus infection 02/12/2022   Displaced fracture of left femoral neck (HCC) 10/09/2018   GI bleed 03/28/2020   AVM   High cholesterol    Hypertension 05/20/2011   Hypertr obst cardiomyop    Hypotension, unspecified    cardiac cath 2006..nonobstructive CAD 30-40s lesions.Marland KitchenETT 1/09 nondiagnostic due to poor HR response..Right Renal Cancer 2003   Iron deficiency anemia    Long term (current) use of anticoagulants    On home oxygen therapy    3L [place]   Osteoarthritis of right hip    PAD (peripheral artery disease) (HCC)    Pancreatic cancer (HCC)    sandostatin   PONV (postoperative nausea and vomiting)    Presence of permanent cardiac pacemaker    Pulmonary HTN (HCC)    RECTAL BLEEDING  10/13/2009   Qualifier: Diagnosis of  By: Wilmon Pali NP, Paula     Red blood cell antibody positive with compatible PRBC difficult to obtain    Anti FYA (Duffy a) antibody. Must be transfused with PRB which are Duffy Antigen Negative and Crossmatch Compatible   Renal cell carcinoma (HCC)    s/p nephrectomy   Squamous cell carcinoma of skin 01/17/2015   R lat wrist   Squamous cell carcinoma of skin 01/29/2018   R post upper leg - superficially invasive   Squamous cell carcinoma of skin 03/25/2018   L lat foot   Squamous cell carcinoma of skin 11/09/2020   left lat foot - EDC 01/01/21, recurrent 01/31/21 - MOHs  02/22/21   Squamous cell carcinoma of skin 12/19/2021   Right Posterior Medial Thigh, EDC   Squamous cell carcinoma of skin 12/19/2021   SCC IS, L lat heel, EDC 02/04/2022   Squamous cell carcinoma of skin 01/21/2022   L forearm, EDC 02/04/2022   Squamous cell carcinoma of skin 01/21/2022   R lower leg below knee, EDC 02/04/2022   Squamous cell carcinoma of skin 01/21/2022   SCCIS, R post heel, EDC 02/04/2022   Squamous cell carcinoma of skin 07/01/2022   left lower abdomen, in situ, EDC   Squamous cell carcinoma of skin 07/01/2022   left medial chest, EDC   Urge incontinence    Venous stasis of both lower extremities     SURGICAL HISTORY: Past Surgical History:  Procedure Laterality Date   ABDOMINAL AORTOGRAM W/LOWER EXTREMITY N/A 02/12/2021   Procedure: ABDOMINAL AORTOGRAM W/LOWER EXTREMITY;  Surgeon: Maeola Harman, MD;  Location: [facility] INVASIVE CV LAB;  Service: Cardiovascular;  Laterality: N/A;   ABDOMINAL HYSTERECTOMY  1975   for benign causes   APPENDECTOMY     BI-VENTRICULAR PACEMAKER UPGRADE  05/04/2010   BIOPSY  02/28/2019   Procedure: BIOPSY;  Surgeon: Rachael Fee, MD;  Location: [facility] ENDOSCOPY;  Service: Endoscopy;;   BIOPSY  03/04/2022   Procedure: BIOPSY;  Surgeon: Lemar Lofty., MD;  Location: [facility] ENDOSCOPY;  Service: Gastroenterology;;   CARDIAC CATHETERIZATION  2006   CARDIOVERSION N/A 02/20/2018   Procedure: CARDIOVERSION;  Surgeon: Antonieta Iba, MD;  Location: [facility] ORS;  Service: Cardiovascular;  Laterality: N/A;   CARDIOVERSION N/A 03/27/2018   Procedure: CARDIOVERSION;  Surgeon: Antonieta Iba, MD;  Location: [facility] ORS;  Service: Cardiovascular;  Laterality: N/A;   CATARACT EXTRACTION W/ INTRAOCULAR LENS  IMPLANT, BILATERAL  01/2006-02-2006   CHOLECYSTECTOMY N/A 11/11/2012   Procedure: LAPAROSCOPIC CHOLECYSTECTOMY WITH INTRAOPERATIVE CHOLANGIOGRAM;  Surgeon: Wilmon Arms. Corliss Skains, MD;  Location: [facility] OR;  Service: General;  Laterality: N/A;    COLONOSCOPY WITH PROPOFOL N/A 02/28/2019   Procedure: COLONOSCOPY WITH PROPOFOL;  Surgeon: Rachael Fee, MD;  Location: [facility] ENDOSCOPY;  Service: Endoscopy;  Laterality: N/A;   ENTEROSCOPY N/A 03/30/2020   Procedure: ENTEROSCOPY;  Surgeon: Shellia Cleverly, DO;  Location: [facility] ENDOSCOPY;  Service: Gastroenterology;  Laterality: N/A;   ENTEROSCOPY N/A 02/10/2021   Procedure: ENTEROSCOPY;  Surgeon: Jeani Hawking, MD;  Location: [facility] ENDOSCOPY;  Service: Endoscopy;  Laterality: N/A;   ENTEROSCOPY N/A 02/15/2022   Procedure: ENTEROSCOPY;  Surgeon: Imogene Burn, MD;  Location: [facility] ENDOSCOPY;  Service: Gastroenterology;  Laterality: N/A;   ENTEROSCOPY N/A 11/06/2022   Procedure: ENTEROSCOPY;  Surgeon: Meridee Score Netty Starring., MD;  Location: [facility] ENDOSCOPY;  Service: Gastroenterology;  Laterality: N/A;   EP IMPLANTABLE DEVICE N/A 02/21/2016   Procedure: ICD Generator Changeout;  Surgeon: Viviann Spare  Anabel Halon, MD;  Location: [facility] INVASIVE CV LAB;  Service: Cardiovascular;  Laterality: N/A;   ESOPHAGOGASTRODUODENOSCOPY N/A 03/04/2022   Procedure: ESOPHAGOGASTRODUODENOSCOPY (EGD);  Surgeon: Lemar Lofty., MD;  Location: [facility] ENDOSCOPY;  Service: Gastroenterology;  Laterality: N/A;   ESOPHAGOGASTRODUODENOSCOPY (EGD) WITH PROPOFOL N/A 02/28/2019   Procedure: ESOPHAGOGASTRODUODENOSCOPY (EGD) WITH PROPOFOL;  Surgeon: Rachael Fee, MD;  Location: [facility] ENDOSCOPY;  Service: Endoscopy;  Laterality: N/A;   EUS N/A 03/04/2022   Procedure: UPPER ENDOSCOPIC ULTRASOUND (EUS) RADIAL;  Surgeon: Lemar Lofty., MD;  Location: [facility] ENDOSCOPY;  Service: Gastroenterology;  Laterality: N/A;   FINE NEEDLE ASPIRATION N/A 03/04/2022   Procedure: FINE NEEDLE ASPIRATION (FNA) LINEAR;  Surgeon: Lemar Lofty., MD;  Location: [facility] ENDOSCOPY;  Service: Gastroenterology;  Laterality: N/A;   GIVENS CAPSULE STUDY N/A 03/15/2019   Procedure: GIVENS CAPSULE STUDY;  Surgeon: Tressia Danas, MD;  Location: [facility] ENDOSCOPY;   Service: Gastroenterology;  Laterality: N/A;   HOT HEMOSTASIS N/A 03/30/2020   Procedure: HOT HEMOSTASIS (ARGON PLASMA COAGULATION/BICAP);  Surgeon: Shellia Cleverly, DO;  Location: [facility] ENDOSCOPY;  Service: Gastroenterology;  Laterality: N/A;   HOT HEMOSTASIS N/A 02/10/2021   Procedure: HOT HEMOSTASIS (ARGON PLASMA COAGULATION/BICAP);  Surgeon: Jeani Hawking, MD;  Location: [facility] ENDOSCOPY;  Service: Endoscopy;  Laterality: N/A;   HOT HEMOSTASIS N/A 02/15/2022   Procedure: HOT HEMOSTASIS (ARGON PLASMA COAGULATION/BICAP);  Surgeon: Imogene Burn, MD;  Location: [facility] ENDOSCOPY;  Service: Gastroenterology;  Laterality: N/A;   HOT HEMOSTASIS N/A 11/06/2022   Procedure: HOT HEMOSTASIS (ARGON PLASMA COAGULATION/BICAP);  Surgeon: Lemar Lofty., MD;  Location: [facility] ENDOSCOPY;  Service: Gastroenterology;  Laterality: N/A;   INSERT / REPLACE / REMOVE PACEMAKER  05-01-11   02-28-05-/05-04-10-ICD-MEDTRONIC MAXIMAL DR   IR ANGIOGRAM SELECTIVE EACH ADDITIONAL VESSEL  02/18/2022   IR ANGIOGRAM SELECTIVE EACH ADDITIONAL VESSEL  02/18/2022   IR ANGIOGRAM VISCERAL SELECTIVE  02/16/2022   IR ANGIOGRAM VISCERAL SELECTIVE  02/16/2022   IR EMBO ART  VEN HEMORR LYMPH EXTRAV  INC GUIDE ROADMAPPING  02/16/2022   IR THORACENTESIS ASP PLEURAL SPACE W/IMG GUIDE  02/21/2022   IR THORACENTESIS ASP PLEURAL SPACE W/IMG GUIDE  02/22/2022   IR US GUIDE VASC ACCESS RIGHT  02/16/2022   JOINT REPLACEMENT     LAPAROSCOPIC CHOLECYSTECTOMY  11/11/2012   LAPAROSCOPIC LYSIS OF ADHESIONS N/A 11/11/2012   Procedure: LAPAROSCOPIC LYSIS OF ADHESIONS;  Surgeon: Wilmon Arms. Corliss Skains, MD;  Location: [facility] OR;  Service: General;  Laterality: N/A;   NEPHRECTOMY Right 06/2001    S/P RENAL CELL CANCER   PERIPHERAL VASCULAR INTERVENTION Bilateral 02/12/2021   Procedure: PERIPHERAL VASCULAR INTERVENTION;  Surgeon: Maeola Harman, MD;  Location: [facility] INVASIVE CV LAB;  Service: Cardiovascular;  Laterality: Bilateral;  Iliac artery stents    PRESSURE SENSOR/CARDIOMEMS N/A 02/03/2019   Procedure: PRESSURE SENSOR/CARDIOMEMS;  Surgeon: Laurey Morale, MD;  Location: [facility] INVASIVE CV LAB;  Service: Cardiovascular;  Laterality: N/A;   RIGHT HEART CATH N/A 11/09/2018   Procedure: RIGHT HEART CATH;  Surgeon: Laurey Morale, MD;  Location: [facility] INVASIVE CV LAB;  Service: Cardiovascular;  Laterality: N/A;   RIGHT HEART CATH N/A 03/08/2019   Procedure: RIGHT HEART CATH;  Surgeon: Laurey Morale, MD;  Location: [facility] INVASIVE CV LAB;  Service: Cardiovascular;  Laterality: N/A;   SUBMUCOSAL TATTOO INJECTION  02/28/2019   Procedure: SUBMUCOSAL TATTOO INJECTION;  Surgeon: Rachael Fee, MD;  Location: [facility] ENDOSCOPY;  Service: Endoscopy;;   SUBMUCOSAL TATTOO INJECTION  11/06/2022   Procedure: SUBMUCOSAL TATTOO INJECTION;  Surgeon: Lemar Lofty., MD;  Location: [facility] ENDOSCOPY;  Service: Gastroenterology;;   TOTAL HIP ARTHROPLASTY Right 05/03/2011   Procedure: TOTAL HIP ARTHROPLASTY ANTERIOR APPROACH;  Surgeon: Kathryne Hitch;  Location: [facility] ORS;  Service: Orthopedics;  Laterality: Right;  Removal of Cannulated Screws Right Hip, Right Direct Anterior Hip Replacement   TOTAL HIP ARTHROPLASTY Left 10/09/2018   Procedure: TOTAL HIP ARTHROPLASTY ANTERIOR APPROACH;  Surgeon: Samson Frederic, MD;  Location: [facility] OR;  Service: Orthopedics;  Laterality: Left;    I have reviewed the social history and family history with the patient and they are unchanged from previous note.  ALLERGIES:  is allergic to codeine, amoxil [amoxicillin], asa [aspirin], ms contin [morphine], neurontin [gabapentin], and nsaids.  MEDICATIONS:  Current Outpatient Medications  Medication Sig Dispense Refill   acetaminophen (TYLENOL) 500 MG tablet Take 1,000 mg by mouth daily.     Ascorbic Acid (VITAMIN C PO) Take 1 tablet by mouth daily.     Cholecalciferol (VITAMIN D-3 PO) Take 2 capsules by mouth daily.     Cyanocobalamin (B-12 PO) Take 1 tablet by mouth daily.      ferrous gluconate (FERGON) 324 MG tablet Take 1 tablet (324 mg total) by mouth daily with breakfast. 30 tablet 3   fluticasone (FLONASE) 50 MCG/ACT nasal spray Place 2 sprays into both nostrils daily as needed for allergies or rhinitis. 16 g 0   metolazone (ZAROXOLYN) 2.5 MG tablet Take 1 tablet (2.5 mg total) by mouth daily for 3 days. 3 tablet 0   Multiple Vitamins-Minerals (HAIR SKIN NAILS PO) Take 1 capsule by mouth daily.     pantoprazole (PROTONIX) 40 MG tablet Take 1 tablet (40 mg total) by mouth daily. 30 tablet 3   potassium chloride SA (KLOR-CON M) 20 MEQ tablet Take 2 tablets (40 mEq total) by mouth every morning AND 1 tablet (20 mEq total) at bedtime. Take extra 2 tabs when you take metolazone. 110 tablet 6   rosuvastatin (CRESTOR) 40 MG tablet Take 1 tablet (40 mg total) by mouth daily. (Patient taking differently: Take 40 mg by mouth every evening.) 30 tablet 11   torsemide (DEMADEX) 20 MG tablet Take 5 tablets (100 mg total) by mouth 2 (two) times daily.     No current facility-administered medications for this visit.    PHYSICAL EXAMINATION: ECOG PERFORMANCE STATUS: 3 - Symptomatic, >50% confined to bed  Vitals:   03/13/23 1309  BP: (!) 137/53  Pulse: 60  Resp: 16  Temp: (!) 97.4 F (36.3 C)  SpO2: 100%   Wt Readings from Last 3 Encounters:  03/13/23 132 lb 11.2 oz (60.2 kg)  03/05/23 136 lb (61.7 kg)  02/26/23 132 lb 1.6 oz (59.9 kg)     GENERAL:alert, no distress and comfortable SKIN: skin color, texture, turgor are normal, no rashes or significant lesions EYES: normal, Conjunctiva are pink and non-injected, sclera clear NECK: supple, thyroid normal size, non-tender, without nodularity LYMPH:  no palpable lymphadenopathy in the cervical, axillary  LUNGS: clear to auscultation and percussion with normal breathing effort HEART: regular rate & rhythm and no murmurs and no lower extremity edema ABDOMEN:abdomen soft, non-tender and normal bowel  sounds Musculoskeletal:no cyanosis of digits and no clubbing  NEURO: alert & oriented x 3 with fluent speech, no focal motor/sensory deficits    LABORATORY DATA:  I have reviewed the data as listed    Latest Ref Rng & Units 03/13/2023   12:46 PM 03/05/2023    1:39 PM 02/26/2023  2:43 PM  CBC  WBC 4.0 - 10.5 K/uL 3.1  3.2  3.2   Hemoglobin 12.0 - 15.0 g/dL 8.4  8.3  9.0   Hematocrit 36.0 - 46.0 % 28.5  28.3  30.4   Platelets 150 - 400 K/uL 169  171  174         Latest Ref Rng & Units 03/13/2023   12:46 PM 03/05/2023    1:39 PM 02/26/2023    2:43 PM  CMP  Glucose 70 - 99 mg/dL 75  65  161   BUN 8 - 23 mg/dL 51  37  39   Creatinine 0.44 - 1.00 mg/dL 0.96  0.45  4.09   Sodium 135 - 145 mmol/L 139  138  140   Potassium 3.5 - 5.1 mmol/L 4.6  4.1  3.9   Chloride 98 - 111 mmol/L 99  102  103   CO2 22 - 32 mmol/L 34  29  30   Calcium 8.9 - 10.3 mg/dL 9.3  9.2  9.1   Total Protein 6.5 - 8.1 g/dL 7.8  7.5  7.5   Total Bilirubin <1.2 mg/dL 0.5  0.7  0.8   Alkaline Phos 38 - 126 U/L 48  45  50   AST 15 - 41 U/L 15  15  15    ALT 0 - 44 U/L 7  5  8        RADIOGRAPHIC STUDIES: I have personally reviewed the radiological images as listed and agreed with the findings in the report. No results found.    No orders of the defined types were placed in this encounter.  All questions were answered. The patient knows to call the clinic with any problems, questions or concerns. No barriers to learning was detected. The total time spent in the appointment was 25 minutes.     Malachy Mood, MD 03/13/2023

## 2023-03-13 NOTE — Assessment & Plan Note (Signed)
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 -She previously had reaction to blood transfusion, or required multiple premedications for blood transfusion -She was recently hospitalized for anemia and a GI bleeding in July 2024. -She is currently on ESA every 2 weeks, IV iron as needed, and blood transfusion as needed. -Due to her multiple hospital admission for anemia and GI bleeding, she started bevacizumab for AVM on 10/9, plan to give every 2 weeksX4

## 2023-03-13 NOTE — Patient Instructions (Signed)
[facility] - A DEPT OF [facility]  Discharge Instructions: Thank you for choosing [facility] to provide your oncology and hematology care.   If you have a lab appointment with the Cancer Center, please go directly to the Cancer Center and check in at the registration area.   Wear comfortable clothing and clothing appropriate for easy access to any Portacath or PICC line.   We strive to give you quality time with your provider. You may need to reschedule your appointment if you arrive late (15 or more minutes).  Arriving late affects you and other patients whose appointments are after yours.  Also, if you miss three or more appointments without notifying the office, you may be dismissed from the clinic at the provider's discretion.      For prescription refill requests, have your pharmacy contact our office and allow 72 hours for refills to be completed.    Today you received the following chemotherapy and/or immunotherapy agents Bevacizumab   To help prevent nausea and vomiting after your treatment, we encourage you to take your nausea medication as directed.  BELOW ARE SYMPTOMS THAT SHOULD BE REPORTED IMMEDIATELY: *FEVER GREATER THAN 100.4 F (38 C) OR HIGHER *CHILLS OR SWEATING *NAUSEA AND VOMITING THAT IS NOT CONTROLLED WITH YOUR NAUSEA MEDICATION *UNUSUAL SHORTNESS OF BREATH *UNUSUAL BRUISING OR BLEEDING *URINARY PROBLEMS (pain or burning when urinating, or frequent urination) *BOWEL PROBLEMS (unusual diarrhea, constipation, pain near the anus) TENDERNESS IN MOUTH AND THROAT WITH OR WITHOUT PRESENCE OF ULCERS (sore throat, sores in mouth, or a toothache) UNUSUAL RASH, SWELLING OR PAIN  UNUSUAL VAGINAL DISCHARGE OR ITCHING   Items with * indicate a potential emergency and should be followed up as soon as possible or go to the Emergency Department if any problems should occur.  Please show the CHEMOTHERAPY ALERT CARD or IMMUNOTHERAPY  ALERT CARD at check-in to the Emergency Department and triage nurse.  Should you have questions after your visit or need to cancel or reschedule your appointment, please contact [facility] - A DEPT OF [facility]  Dept: [phone]  and follow the prompts.  Office hours are 8:00 a.m. to 4:30 p.m. Monday - Friday. Please note that voicemails left after 4:00 p.m. may not be returned until the following business day.  We are closed weekends and major holidays. You have access to a nurse at all times for urgent questions. Please call the main number to the clinic Dept: [phone] and follow the prompts.   For any non-urgent questions, you may also contact your provider using MyChart. We now offer e-Visits for anyone 58 and older to request care online for non-urgent symptoms. For details visit [email or web address].   Also download the MyChart app! Go to the app store, search "MyChart", open the app, select [facility], and log in with your MyChart username and password.   Bevacizumab Injection What is this medication? BEVACIZUMAB (be va SIZ yoo mab) treats some types of cancer. It works by blocking a protein that causes cancer cells to grow and multiply. This helps to slow or stop the spread of cancer cells. It is a monoclonal antibody. This medicine may be used for other purposes; ask your health care provider or pharmacist if you have questions. COMMON BRAND NAME(S): Alymsys, Avastin, MVASI, Omer Jack What should I tell my care team before I take this medication? They need to know if you have any of these conditions: Blood clots  Coughing up blood Having or recent surgery Heart failure High blood pressure History of a connection between 2 or more body parts that do not usually connect (fistula) History of a tear in your stomach or intestines Protein in your urine An unusual or allergic reaction to bevacizumab, other medications, foods, dyes, or  preservatives Pregnant or trying to get pregnant Breast-feeding How should I use this medication? This medication is injected into a vein. It is given by your care team in a hospital or clinic setting. Talk to your care team the use of this medication in children. Special care may be needed. Overdosage: If you think you have taken too much of this medicine contact a poison control center or emergency room at once. NOTE: This medicine is only for you. Do not share this medicine with others. What if I miss a dose? Keep appointments for follow-up doses. It is important not to miss your dose. Call your care team if you are unable to keep an appointment. What may interact with this medication? Interactions are not expected. This list may not describe all possible interactions. Give your health care provider a list of all the medicines, herbs, non-prescription drugs, or dietary supplements you use. Also tell them if you smoke, drink alcohol, or use illegal drugs. Some items may interact with your medicine. What should I watch for while using this medication? Your condition will be monitored carefully while you are receiving this medication. You may need blood work while taking this medication. This medication may make you feel generally unwell. This is not uncommon as chemotherapy can affect healthy cells as well as cancer cells. Report any side effects. Continue your course of treatment even though you feel ill unless your care team tells you to stop. This medication may increase your risk to bruise or bleed. Call your care team if you notice any unusual bleeding. Before having surgery, talk to your care team to make sure it is ok. This medication can increase the risk of poor healing of your surgical site or wound. You will need to stop this medication for 28 days before surgery. After surgery, wait at least 28 days before restarting this medication. Make sure the surgical site or wound is healed enough  before restarting this medication. Talk to your care team if questions. Talk to your care team if you may be pregnant. Serious birth defects can occur if you take this medication during pregnancy and for 6 months after the last dose. Contraception is recommended while taking this medication and for 6 months after the last dose. Your care team can help you find the option that works for you. Do not breastfeed while taking this medication and for 6 months after the last dose. This medication can cause infertility. Talk to your care team if you are concerned about your fertility. What side effects may I notice from receiving this medication? Side effects that you should report to your care team as soon as possible: Allergic reactions--skin rash, itching, hives, swelling of the face, lips, tongue, or throat Bleeding--bloody or black, tar-like stools, vomiting blood or brown material that looks like coffee grounds, red or dark brown urine, small red or purple spots on skin, unusual bruising or bleeding Blood clot--pain, swelling, or warmth in the leg, shortness of breath, chest pain Heart attack--pain or tightness in the chest, shoulders, arms, or jaw, nausea, shortness of breath, cold or clammy skin, feeling faint or lightheaded Heart failure--shortness of breath, swelling of the ankles, feet,  or hands, sudden weight gain, unusual weakness or fatigue Increase in blood pressure Infection--fever, chills, cough, sore throat, wounds that don't heal, pain or trouble when passing urine, general feeling of discomfort or being unwell Infusion reactions--chest pain, shortness of breath or trouble breathing, feeling faint or lightheaded Kidney injury--decrease in the amount of urine, swelling of the ankles, hands, or feet Stomach pain that is severe, does not go away, or gets worse Stroke--sudden numbness or weakness of the face, arm, or leg, trouble speaking, confusion, trouble walking, loss of balance or  coordination, dizziness, severe headache, change in vision Sudden and severe headache, confusion, change in vision, seizures, which may be signs of posterior reversible encephalopathy syndrome (PRES) Side effects that usually do not require medical attention (report to your care team if they continue or are bothersome): Back pain Change in taste Diarrhea Dry skin Increased tears Nosebleed This list may not describe all possible side effects. Call your doctor for medical advice about side effects. You may report side effects to FDA at 1-800-FDA-1088. Where should I keep my medication? This medication is given in a hospital or clinic. It will not be stored at home. NOTE: This sheet is a summary. It may not cover all possible information. If you have questions about this medicine, talk to your doctor, pharmacist, or health care provider.  2024 [employer]/Gold Standard (2021-09-07 00:00:00)

## 2023-03-13 NOTE — Assessment & Plan Note (Signed)
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   -Outpatient EUS 03/04/2022 by Dr. 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 Freida Busman did not recommend surgery in multifocal NET and also due to her age and co-morbidities. -She began first line sandostatin injections q28 days on 03/27/22.  -Dotatate PET scan, done 2 weeks after first injection 12/6, shows no significant tracer avidity in the pancrease or distant sites. Findings are likely due to close timing relative to the sandostatin injection blocking receptors. When we repeat this for restaging next year, will arrange 4 weeks after sandostatin -She has persistent anemia, likely secondary to GI bleeding from the tumor and AVM, she has had multiple hospital admission for anemia and bleeding.

## 2023-03-13 NOTE — Progress Notes (Signed)
Ok per Dr. Mosetta Putt to use the urine protein from 03/05/23 which was negative.

## 2023-03-14 ENCOUNTER — Encounter: Payer: Self-pay | Admitting: Cardiology

## 2023-03-17 ENCOUNTER — Ambulatory Visit: Payer: Self-pay

## 2023-03-17 NOTE — Progress Notes (Signed)
ADVANCED HF CLINIC NOTE   ID:  [name], DOB August 23, 1933, MRN [id number]  Type of Visit: Established patient  PCP:  [name], MD    Cardiologist:  Sherryl Manges, MD Primary HF: Dr. Shirlee Latch   History of Present Illness: 89 y.o. with history of permanent atrial fibrillation, chronic diastolic CHF, CKD stage 3, smoking/prior COPD, renal cell CA s/p right nephrectomy.  She has a prior history of HFrEF with nonischemic cardiomyopathy and had a Medtronic ICD placed.  However, subsequently her EF has recovered.  More recently, she has had symptomatic HFpEF.    6/20, she had left hip ORIF after mechanical fall.  Echo in 6/20 showed EF 60-65%, mild RV dilation with normal systolic function, PASP 93 mmHg.  After she got home from the hospital stay post-ORIF, she noted marked peripheral edema.  She was short of breath walking short distances.  +orthopnea.  She came back to the hospital on 11/04/18 and was admitted with volume overload.  She was diuresed aggressively and lost about 22 lbs.  RHC showed pulmonary venous hypertension.  PCWP tracing had prominent v-waves in absence of significant MR, suggesting significant diastolic dysfunction.   She has been noted to have significant Fe deficiency anemia, but FOBT was negative during 7/20 hospitalization.   Admitted 11/20 with symptomatic anemia, GI workup concerning for ischemic bowel.  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 Durwin Nora with VVS and conservative management recommended.  Capsule endoscopy showed no definite source of bleeding.  She was volume overloaded due to significant RV dysfunction and diuresed, she also developed AKI on CKD stage 3.      10/22, she had bilateral CIA stents placed. ABIs in 11/22 showed patent stents.  She had a skin cancer removed from her left leg. 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   Echo 2/23 EF 65-70% with mild LVH, D-shaped septum with  moderate RV enlargement, and mildly decreased RV function, PASP 92, moderate-severe TR, dilated IVC, moderate aortic stenosis.  Echo in 10/23 showed EF 60-65%, D-shaped septum, mild RV enlargement with normal RV systolic function, PASP 90, moderate-severe TR, mild AS, IVC dilated.   Patient was found to have a neuroendocrine tumor of the pancreatic head in the fall of 2023.  She has been treated with sandostatin monthly.  She also had further GI bleeding from small bowel AVMs in fall 2023.  She also had COVID-19 in 10/23 and has been on home oxygen 2L since that time.   Follow up 2/24, NYHA II-III chronic symptoms, volume overloaded. Torsemide increased to 80 mg daily, Farxiga added.   Follow up with Dr. Graciela Husbands, long discussion about EOL. Given DNR form and high-voltage therapies inactivated on ICD.  Diagnosed with shingles 09/02/22. Treated with Valtrex x1 week.   Admit 07/24 with acute on chronic blood loss anemia 2/2 GI bleed. Transfused and given IV iron. Had small bowel enteroscopy which showed 2 nonbleeding angiodysplastic lesions in duodenum and 3 nonbleeding angioplastic lesions in jejunum treated with APC.  Admitted early 9/24 with acute on chronic anemia suspected 2/2 chronic GI blood loss. Hgb down to 6.8. Received 2 u RBCs. GI did not recommend any endoscopic procedures d/t advanced age and comorbidities. Course further c/b acute on chronic CHF. She was diuresed with IV lasix and metolazone. Echo during admit with LVEF 65-70%, D-shaped septum, mildly reduced RV, RVSP 103 mmHg, biatrial enlargement, severe TR, moderate mitral stenosis with mean gradient  of 6 mmHg, dilated IVC.  She was seen back in the [facility] on 01/30/23 for post hospital f/u and noted recurrent melena, SOB and fatigued w/ minimal exertion. Also noted to have recurrent volume overload w/ marked return of LEE. Hgb was down to 6.6. She was sent back to the ED and readmitted again for a/c CHF and GIB, treated w/ blood transfusions,  IV Fe and diuretics. Discharged on 02/06/23 on torsemide 100 mg bid.   Today she returns for AHF follow up. Overall feeling ***. Denies palpitations, CP, dizziness, edema, or PND/Orthopnea. *** SOB. Appetite ok. No fever or chills. Weight at home *** pounds. Taking all medications.   ECG (personally reviewed): ***  Medtronic device interrogation (personally reviewed): OptiVol up, thoracic impedence down, 80.7% V pacing, 0.1 hr/day activity, no AF ***   Cardiomems on 02/13/23: 29, goal 17    Labs (10/23): magnesium 1.7 Labs (3/24): TSH 2.93 Labs (4/24): K 3.9, creatinine 1.69, ALT 9, hgb 10, Plt 175 Labs (8/24): K 4.5, creatinine, 1.6 Labs (10/24): K 4.1, creatinine 1.55, Hgb 7.6 Labs (11/24): K 4.6, SCr 1.52  PMH: 1. COPD 2. Renal cell carcinoma: S/p right kidney resection.  3. CKD stage 3 4. Fe deficiency anemia 5. Atrial fibrillation: Permanent. She has failed amiodarone, Tikosyn, and flecainide as well as multiple cardioversions. 6. Chronic diastolic CHF: She has history of prior HFrEF with nonischemic cardiomyopathy.  She has a Medtronic ICD.   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  - More recently, EF has been in normal range but she has had diastolic CHF.  - Echo (6/20): EF 60-65%, mildly dilated RV with PASP 93 mmHg, mild-moderate TR.  - RHC (7/20): mean RA 11, PA 64/18 mean 39, mean PCWP 24 with v waves to 41, CI 4.21, PVR 1.9 WU.  - Cardiomems placement - RHC (11/20): mean RA 13, PA 63/23 mean 25 with v waves to 47 (MR only mild by echo), mean PCWP 25, CI 3.18, PVR 2.6 WU - Echo (11/21): EF 65-70%, moderate LVH, D-shaped septum with mildly decreased RV systolic function, RVSP 85.9 mmHg, severe RAE, severe TR, mod-sev AS. - Echo (2/23): EF 65-70% with mild LVH, D-shaped septum with moderate RV enlargement, and mildly decreased RV function, PASP 92, moderate-severe TR, dilated IVC, moderate aortic stenosis.  - Echo (10/23): EF 60-65%, D-shaped septum, mild RV enlargement with  normal RV systolic function, PASP 90, moderate-severe TR, mild AS, IVC dilated.  - Echo (9/24): LVEF 65-70%, D-shaped septum, mildly reduced RV, RVSP 103 mmHg, biatrial enlargement, severe TR, moderate mitral stenosis with mean gradient of 6 mmHg, dilated IVC 7. Hyperlipidemia 8. Carotid stenosis: Carotid dopplers (3/19) with 40-59% LICA stenosis.   9. Possible ischemic bowel in 11/20 10. 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 Conservative management per Dr. Durwin Nora.  - 10/22 bilateral CIA stents.  - 11/22 ABIs: 1.07 left, 0.83 right with patent CIA stents.  11. 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  12. Squamous cell skin cancer.  13. Aortic stenosis: Mild on 10/23 echo.  14. 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  15. COVID 19 in 10/23       Past Medical History:  Diagnosis Date   Actinic keratosis 01/17/2015    R forearm   Adjustment disorder with anxiety     Anemia     Anginal pain (HCC)     Arthritis      "some in my hands" (  11/11/2012)   Atrial fibrillation (HCC)     Atypical mole 03/25/2018    L forearm - severe   Automatic implantable cardioverter-defibrillator in situ     Avascular necrosis of hip (HCC) 05/03/2011   Blood in stool     Carotid artery stenosis 09/2007    a. 09/2007: 60-79% bilateral (stable); b. 10/2008: 40-59% R 60-79%    Cellulitis of left lower extremity 07/13/2020   Chest pain, unspecified     Chronic airway obstruction, not elsewhere classified     Chronic bronchitis (HCC)      "get it some; not q year" (11/11/2012)   Chronic diastolic CHF (congestive heart failure) (HCC) 06/04/2013   Chronic kidney disease, unspecified     Coronary artery disease      non-obstructive by 2006 cath   COVID-19 virus infection 02/12/2022   Displaced fracture of left femoral neck (HCC) 10/09/2018   Frequent UTI      "get them a couple times/yr" (11/11/2012)   GI bleed 03/28/2020    Hemorrhage of rectum and anus     High cholesterol     History of blood transfusion 04/2011    "after hip OR" (11/11/2012)   Hx of renal cell cancer     Hypertension 05/20/2011   Hypertr obst cardiomyop     Hypotension, unspecified      cardiac cath 2006..nonobstructive CAD 30-40s lesions.Marland KitchenETT 1/09 nondiagnostic due to poor HR response..Right Renal Cancer 2003   Long term (current) use of anticoagulants     Malignant neoplasm of kidney, except pelvis     Osteoarthritis of right hip     Other and unspecified coagulation defects     PONV (postoperative nausea and vomiting)     Presence of permanent cardiac pacemaker     RECTAL BLEEDING 10/13/2009    Qualifier: Diagnosis of  By: Wilmon Pali NP, Paula     Renal cancer (HCC) 06/2001    Right   Secondary cardiomyopathy, unspecified     Sinus bradycardia     Squamous cell carcinoma of skin 01/17/2015    R lat wrist   Squamous cell carcinoma of skin 01/29/2018    R post upper leg - superficially invasive   Squamous cell carcinoma of skin 03/25/2018    L lat foot   Squamous cell carcinoma of skin 11/09/2020    left lat foot - EDC 01/01/21, recurrent 01/31/21 - MOHs 02/22/21   Squamous cell carcinoma of skin 12/19/2021    Right Posterior Medial Thigh, EDC   Squamous cell carcinoma of skin 12/19/2021    SCC IS, L lat heel, EDC 02/04/2022   Squamous cell carcinoma of skin 01/21/2022    L forearm, EDC 02/04/2022   Squamous cell carcinoma of skin 01/21/2022    R lower leg below knee, EDC 02/04/2022   Squamous cell carcinoma of skin 01/21/2022    SCCIS, R post heel, EDC 02/04/2022   Squamous cell carcinoma of skin 07/01/2022    left lower abdomen, in situ, EDC   Squamous cell carcinoma of skin 07/01/2022    left medial chest, EDC   Urge incontinence           Past Surgical History:  Procedure Laterality Date   ABDOMINAL AORTOGRAM W/LOWER EXTREMITY N/A 02/12/2021    Procedure: ABDOMINAL AORTOGRAM W/LOWER EXTREMITY;  Surgeon: Maeola Harman, MD;  Location: [facility] INVASIVE CV LAB;  Service: Cardiovascular;  Laterality: N/A;   ABDOMINAL HYSTERECTOMY   1975    for benign causes  APPENDECTOMY       BI-VENTRICULAR PACEMAKER UPGRADE   05/04/2010   BIOPSY   02/28/2019    Procedure: BIOPSY;  Surgeon: Rachael Fee, MD;  Location: [facility] ENDOSCOPY;  Service: Endoscopy;;   BIOPSY   03/04/2022    Procedure: BIOPSY;  Surgeon: Lemar Lofty., MD;  Location: [facility] ENDOSCOPY;  Service: Gastroenterology;;   CARDIAC CATHETERIZATION   2006   CARDIOVERSION N/A 02/20/2018    Procedure: CARDIOVERSION;  Surgeon: Antonieta Iba, MD;  Location: [facility] ORS;  Service: Cardiovascular;  Laterality: N/A;   CARDIOVERSION N/A 03/27/2018    Procedure: CARDIOVERSION;  Surgeon: Antonieta Iba, MD;  Location: [facility] ORS;  Service: Cardiovascular;  Laterality: N/A;   CATARACT EXTRACTION W/ INTRAOCULAR LENS  IMPLANT, BILATERAL   01/2006-02-2006   CHOLECYSTECTOMY N/A 11/11/2012    Procedure: LAPAROSCOPIC CHOLECYSTECTOMY WITH INTRAOPERATIVE CHOLANGIOGRAM;  Surgeon: Wilmon Arms. Corliss Skains, MD;  Location: [facility] OR;  Service: General;  Laterality: N/A;   COLONOSCOPY WITH PROPOFOL N/A 02/28/2019    Procedure: COLONOSCOPY WITH PROPOFOL;  Surgeon: Rachael Fee, MD;  Location: [facility] ENDOSCOPY;  Service: Endoscopy;  Laterality: N/A;   ENTEROSCOPY N/A 03/30/2020    Procedure: ENTEROSCOPY;  Surgeon: Shellia Cleverly, DO;  Location: [facility] ENDOSCOPY;  Service: Gastroenterology;  Laterality: N/A;   ENTEROSCOPY N/A 02/10/2021    Procedure: ENTEROSCOPY;  Surgeon: Jeani Hawking, MD;  Location: [facility] ENDOSCOPY;  Service: Endoscopy;  Laterality: N/A;   ENTEROSCOPY N/A 02/15/2022    Procedure: ENTEROSCOPY;  Surgeon: Imogene Burn, MD;  Location: [facility] ENDOSCOPY;  Service: Gastroenterology;  Laterality: N/A;   EP IMPLANTABLE DEVICE N/A 02/21/2016    Procedure: ICD Generator Changeout;  Surgeon: Duke Salvia, MD;  Location: [facility] INVASIVE CV LAB;  Service: Cardiovascular;   Laterality: N/A;   ESOPHAGOGASTRODUODENOSCOPY N/A 03/04/2022    Procedure: ESOPHAGOGASTRODUODENOSCOPY (EGD);  Surgeon: Lemar Lofty., MD;  Location: [facility] ENDOSCOPY;  Service: Gastroenterology;  Laterality: N/A;   ESOPHAGOGASTRODUODENOSCOPY (EGD) WITH PROPOFOL N/A 02/28/2019    Procedure: ESOPHAGOGASTRODUODENOSCOPY (EGD) WITH PROPOFOL;  Surgeon: Rachael Fee, MD;  Location: [facility] ENDOSCOPY;  Service: Endoscopy;  Laterality: N/A;   EUS N/A 03/04/2022    Procedure: UPPER ENDOSCOPIC ULTRASOUND (EUS) RADIAL;  Surgeon: Lemar Lofty., MD;  Location: [facility] ENDOSCOPY;  Service: Gastroenterology;  Laterality: N/A;   FINE NEEDLE ASPIRATION N/A 03/04/2022    Procedure: FINE NEEDLE ASPIRATION (FNA) LINEAR;  Surgeon: Lemar Lofty., MD;  Location: [facility] ENDOSCOPY;  Service: Gastroenterology;  Laterality: N/A;   GIVENS CAPSULE STUDY N/A 03/15/2019    Procedure: GIVENS CAPSULE STUDY;  Surgeon: Tressia Danas, MD;  Location: [facility] ENDOSCOPY;  Service: Gastroenterology;  Laterality: N/A;   HOT HEMOSTASIS N/A 03/30/2020    Procedure: HOT HEMOSTASIS (ARGON PLASMA COAGULATION/BICAP);  Surgeon: Shellia Cleverly, DO;  Location: [facility] ENDOSCOPY;  Service: Gastroenterology;  Laterality: N/A;   HOT HEMOSTASIS N/A 02/10/2021    Procedure: HOT HEMOSTASIS (ARGON PLASMA COAGULATION/BICAP);  Surgeon: Jeani Hawking, MD;  Location: [facility] ENDOSCOPY;  Service: Endoscopy;  Laterality: N/A;   HOT HEMOSTASIS N/A 02/15/2022    Procedure: HOT HEMOSTASIS (ARGON PLASMA COAGULATION/BICAP);  Surgeon: Imogene Burn, MD;  Location: [facility] ENDOSCOPY;  Service: Gastroenterology;  Laterality: N/A;   INSERT / REPLACE / REMOVE PACEMAKER   05-01-11    02-28-05-/05-04-10-ICD-MEDTRONIC MAXIMAL DR   IR ANGIOGRAM SELECTIVE EACH ADDITIONAL VESSEL   02/18/2022   IR ANGIOGRAM SELECTIVE EACH ADDITIONAL VESSEL   02/18/2022   IR ANGIOGRAM VISCERAL SELECTIVE   02/16/2022   IR ANGIOGRAM VISCERAL SELECTIVE  02/16/2022   IR EMBO ART  VEN HEMORR  LYMPH EXTRAV  INC GUIDE ROADMAPPING   02/16/2022   IR THORACENTESIS ASP PLEURAL SPACE W/IMG GUIDE   02/21/2022   IR THORACENTESIS ASP PLEURAL SPACE W/IMG GUIDE   02/22/2022   IR US GUIDE VASC ACCESS RIGHT   02/16/2022   JOINT REPLACEMENT       LAPAROSCOPIC CHOLECYSTECTOMY   11/11/2012   LAPAROSCOPIC LYSIS OF ADHESIONS N/A 11/11/2012    Procedure: LAPAROSCOPIC LYSIS OF ADHESIONS;  Surgeon: Wilmon Arms. Corliss Skains, MD;  Location: [facility] OR;  Service: General;  Laterality: N/A;   NEPHRECTOMY Right 06/2001     S/P RENAL CELL CANCER   PERIPHERAL VASCULAR INTERVENTION Bilateral 02/12/2021    Procedure: PERIPHERAL VASCULAR INTERVENTION;  Surgeon: Maeola Harman, MD;  Location[facility] INVASIVE CV LAB;  Service: Cardiovascular;  Laterality: Bilateral;  Iliac artery stents   PRESSURE SENSOR/CARDIOMEMS N/A 02/03/2019    Procedure: PRESSURE SENSOR/CARDIOMEMS;  Surgeon: Laurey Morale, MD;  Location: [facility] INVASIVE CV LAB;  Service: Cardiovascular;  Laterality: N/A;   RIGHT HEART CATH N/A 11/09/2018    Procedure: RIGHT HEART CATH;  Surgeon: Laurey Morale, MD;  Location: [facility] INVASIVE CV LAB;  Service: Cardiovascular;  Laterality: N/A;   RIGHT HEART CATH N/A 03/08/2019    Procedure: RIGHT HEART CATH;  Surgeon: Laurey Morale, MD;  Location: [facility] INVASIVE CV LAB;  Service: Cardiovascular;  Laterality: N/A;   SUBMUCOSAL TATTOO INJECTION   02/28/2019    Procedure: SUBMUCOSAL TATTOO INJECTION;  Surgeon: Rachael Fee, MD;  Location: [facility] ENDOSCOPY;  Service: Endoscopy;;   TOTAL HIP ARTHROPLASTY Right 05/03/2011    Procedure: TOTAL HIP ARTHROPLASTY ANTERIOR APPROACH;  Surgeon: Kathryne Hitch;  Location: [facility] ORS;  Service: Orthopedics;  Laterality: Right;  Removal of Cannulated Screws Right Hip, Right Direct Anterior Hip Replacement   TOTAL HIP ARTHROPLASTY Left 10/09/2018    Procedure: TOTAL HIP ARTHROPLASTY ANTERIOR APPROACH;  Surgeon: Samson Frederic, MD;  Location: [facility] OR;  Service: Orthopedics;  Laterality: Left;     Current Outpatient Medications on File Prior to Visit  Medication Sig Dispense Refill   acetaminophen (TYLENOL) 500 MG tablet Take 1,000 mg by mouth daily.     Ascorbic Acid (VITAMIN C PO) Take 1 tablet by mouth daily.     Cholecalciferol (VITAMIN D-3 PO) Take 2 capsules by mouth daily.     Cyanocobalamin (B-12 PO) Take 1 tablet by mouth daily.     ferrous gluconate (FERGON) 324 MG tablet Take 1 tablet (324 mg total) by mouth daily with breakfast. 30 tablet 3   fluticasone (FLONASE) 50 MCG/ACT nasal spray Place 2 sprays into both nostrils daily as needed for allergies or rhinitis. 16 g 0   metolazone (ZAROXOLYN) 2.5 MG tablet Take 1 tablet (2.5 mg total) by mouth daily for 3 days. 3 tablet 0   Multiple Vitamins-Minerals (HAIR SKIN NAILS PO) Take 1 capsule by mouth daily.     pantoprazole (PROTONIX) 40 MG tablet Take 1 tablet (40 mg total) by mouth daily. 30 tablet 3   potassium chloride SA (KLOR-CON M) 20 MEQ tablet Take 2 tablets (40 mEq total) by mouth every morning AND 1 tablet (20 mEq total) at bedtime. Take extra 2 tabs when you take metolazone. 110 tablet 6   rosuvastatin (CRESTOR) 40 MG tablet Take 1 tablet (40 mg total) by mouth daily. (Patient taking differently: Take 40 mg by mouth every evening.) 30 tablet 11   torsemide (DEMADEX) 20 MG tablet Take 5  tablets (100 mg total) by mouth 2 (two) times daily.     No current facility-administered medications on file prior to visit.     Allergies:   Dilaudid [hydromorphone], Fentanyl, Codeine, Morphine and related, Amoxicillin, Hydrocodone, Oxycodone, Tramadol, Whole blood, and Sulfonamide derivatives    Social History:  The patient  reports that she quit smoking about 21 years ago. Her smoking use included cigarettes. She has a 20.00 pack-year smoking history. She has never used smokeless tobacco. She reports that she does not drink alcohol and does not use drugs.    Family History:  The patient's family history includes Breast cancer in  her cousin and another family member; Breast cancer (age of onset: 72) in her maternal aunt; Early death in her father; Heart failure in her mother.    ROS:  Please see the history of present illness.   All other systems are personally reviewed and negative.    There were no vitals taken for this visit.  Wt Readings from Last 3 Encounters:  03/13/23 60.2 kg (132 lb 11.2 oz)  03/05/23 61.7 kg (136 lb)  02/26/23 59.9 kg (132 lb 1.6 oz)   PHYSICAL EXAM: General:  *** appearing.  No respiratory difficulty HEENT: normal Neck: supple. JVD *** cm. Carotids 2+ bilat; no bruits. No lymphadenopathy or thyromegaly appreciated. Cor: PMI nondisplaced. Regular rate & rhythm. No rubs, gallops or murmurs. Lungs: clear Abdomen: soft, nontender, nondistended. No hepatosplenomegaly. No bruits or masses. Good bowel sounds. Extremities: no cyanosis, clubbing, rash, edema  Neuro: alert & oriented x 3, cranial nerves grossly intact. moves all 4 extremities w/o difficulty. Affect pleasant.   ASSESSMENT AND PLAN: 1. Chronic diastolic CHF: With prominent RV failure.  Echo in 6/20 showed EF 60-65%, mild RV dilation with normal systolic function, PASP 93 mmHg.  She had remote nonischemic cardiomyopathy with recovery of EF, has Medtronic ICD.  I suspect that there is significant RV dysfunction. Atrial fibrillation likely contributes, but this appears to be permanent now as she has failed multiple anti-arrhythmics and is reasonably rate-controlled. RHC 11/20 showed elevated filling pressures and pulmonary venous hypertension.  Prominent v-waves on PCWP tracing likely due to stiff ventricle/diastolic dysfunction as she had only mild MR on echo.  Echo 10/23 with EF 60-65%, D-shaped septum, mild RV enlargement with normal RV systolic function, PASP 90, moderate-severe TR, mild AS, IVC dilated. Echo (9/24): LVEF 65-70%, D-shaped septum, mildly reduced RV, RVSP 103 mmHg, biatrial enlargement, severe TR, mild AS with mean  gradient of 17 mmhg, moderate mitral stenosis with mean gradient of 6 mmHg, dilated IVC. She is RV pacing at a high percentage, but LV EF has remained normal. NYHA III-IIIb, she is volume overloaded today. - Give metolazone 2.5 mg + extra 40 KCL daily x 3 days. Labs today reviewed and are stable, K 4.1, SCr 1.52 - Continue torsemide 100 mg bid + 40/20 KCL - Off SGLT2i after UTIs - Cardiomems pillow not working. I asked her to call company to troubleshoot. - Next step will be Furoscix. May ultimately need weekly metolazone  2. Hypertension: BP controlled.  3. Pulmonary hypertension: PA systolic pressure estimated 103 mmHg on last echo in setting of volume overload. Based on RHC 11/20, she has primarily pulmonary venous hypertension, not candidate for pulmonary vasodilators.  4. CKD: Stage 4. Off SGLT2i - Labs today showed Scr 1.52 5. Chronic anemia/GI bleeding: Due to small bowel AVMs.  - 2 recent admissions for GI bleeding - She is being followed by Heme/Onc for  her anemia.  - She had labs today, may need transfusion tomorrow. If so, would give IV Lasix 40 mg after transfusion. 6. Atrial fibrillation: Permanent.  Rate control is reasonable. She has failed amiodarone, Tikosyn, and flecainide as well as multiple cardioversions.  - Off Eliquis with recurrent bleeding 7. Aortic stenosis: Mild on last echo.  8. Neuroendocrine tumor of pancreatic head: Treating palliative with sandostatin.  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 Volume becoming more difficult to manage. Recurrent admissions. Dr. Graciela Husbands previously discussed EOL issues with her and her ICD high velocity therapies are de-activated.  - She has DNR.   Follow up in 7-10 days with APP. She is high-risk for re-admit.    Signed, Brynda Peon, AGACNP-BC  03/17/23  [facility] [address] [facility] [place] [address] [phone] (office) [phone] (fax)

## 2023-03-17 NOTE — Patient Outreach (Signed)
  Care Coordination   03/17/2023 Name: [name] MRN: [id number] DOB: Apr 02, 1934   Care Coordination Outreach Attempts:  Successful contact made with patient.   Patient states Palliative care nurse is with her.  Request call back on another day.  Patient states Monday's are better for calls.   Follow Up Plan:  Additional outreach attempts will be made to offer the patient care coordination information and services.   Encounter Outcome:  Patient Request to Call Back   Care Coordination Interventions:  No, not indicated    George Ina RN,BSN,CCM [facility]  [facility] coordinator / Case Manager Phone: [phone]

## 2023-03-18 ENCOUNTER — Inpatient Hospital Stay: Payer: PPO

## 2023-03-18 ENCOUNTER — Ambulatory Visit (HOSPITAL_BASED_OUTPATIENT_CLINIC_OR_DEPARTMENT_OTHER)
Admission: RE | Admit: 2023-03-18 | Discharge: 2023-03-18 | Disposition: A | Discharge status: Home / Self Care | Payer: PPO | Source: Ambulatory Visit | Attending: Internal Medicine | Admitting: Internal Medicine

## 2023-03-18 ENCOUNTER — Other Ambulatory Visit: Payer: Self-pay

## 2023-03-18 ENCOUNTER — Encounter (HOSPITAL_COMMUNITY): Payer: Self-pay

## 2023-03-18 VITALS — BP 138/60 | HR 60 | Wt 136.0 lb

## 2023-03-18 DIAGNOSIS — D3A8 Other benign neuroendocrine tumors: Secondary | ICD-10-CM

## 2023-03-18 DIAGNOSIS — D649 Anemia, unspecified: Secondary | ICD-10-CM

## 2023-03-18 DIAGNOSIS — Z87891 Personal history of nicotine dependence: Secondary | ICD-10-CM | POA: Insufficient documentation

## 2023-03-18 DIAGNOSIS — I428 Other cardiomyopathies: Secondary | ICD-10-CM | POA: Insufficient documentation

## 2023-03-18 DIAGNOSIS — Z7189 Other specified counseling: Secondary | ICD-10-CM | POA: Diagnosis not present

## 2023-03-18 DIAGNOSIS — I350 Nonrheumatic aortic (valve) stenosis: Secondary | ICD-10-CM

## 2023-03-18 DIAGNOSIS — Z95810 Presence of automatic (implantable) cardiac defibrillator: Secondary | ICD-10-CM | POA: Insufficient documentation

## 2023-03-18 DIAGNOSIS — Z8719 Personal history of other diseases of the digestive system: Secondary | ICD-10-CM | POA: Diagnosis not present

## 2023-03-18 DIAGNOSIS — N184 Chronic kidney disease, stage 4 (severe): Secondary | ICD-10-CM

## 2023-03-18 DIAGNOSIS — Z9981 Dependence on supplemental oxygen: Secondary | ICD-10-CM | POA: Insufficient documentation

## 2023-03-18 DIAGNOSIS — D631 Anemia in chronic kidney disease: Secondary | ICD-10-CM | POA: Diagnosis not present

## 2023-03-18 DIAGNOSIS — Z66 Do not resuscitate: Secondary | ICD-10-CM | POA: Insufficient documentation

## 2023-03-18 DIAGNOSIS — Z79899 Other long term (current) drug therapy: Secondary | ICD-10-CM | POA: Insufficient documentation

## 2023-03-18 DIAGNOSIS — I272 Pulmonary hypertension, unspecified: Secondary | ICD-10-CM | POA: Insufficient documentation

## 2023-03-18 DIAGNOSIS — Z8616 Personal history of COVID-19: Secondary | ICD-10-CM | POA: Insufficient documentation

## 2023-03-18 DIAGNOSIS — I4821 Permanent atrial fibrillation: Secondary | ICD-10-CM | POA: Insufficient documentation

## 2023-03-18 DIAGNOSIS — M7989 Other specified soft tissue disorders: Secondary | ICD-10-CM | POA: Insufficient documentation

## 2023-03-18 DIAGNOSIS — I5032 Chronic diastolic (congestive) heart failure: Secondary | ICD-10-CM | POA: Insufficient documentation

## 2023-03-18 DIAGNOSIS — J449 Chronic obstructive pulmonary disease, unspecified: Secondary | ICD-10-CM | POA: Insufficient documentation

## 2023-03-18 DIAGNOSIS — D62 Acute posthemorrhagic anemia: Secondary | ICD-10-CM | POA: Insufficient documentation

## 2023-03-18 DIAGNOSIS — Z5112 Encounter for antineoplastic immunotherapy: Secondary | ICD-10-CM | POA: Diagnosis not present

## 2023-03-18 DIAGNOSIS — I083 Combined rheumatic disorders of mitral, aortic and tricuspid valves: Secondary | ICD-10-CM | POA: Insufficient documentation

## 2023-03-18 DIAGNOSIS — I10 Essential (primary) hypertension: Secondary | ICD-10-CM

## 2023-03-18 DIAGNOSIS — I130 Hypertensive heart and chronic kidney disease with heart failure and stage 1 through stage 4 chronic kidney disease, or unspecified chronic kidney disease: Secondary | ICD-10-CM | POA: Insufficient documentation

## 2023-03-18 DIAGNOSIS — N1832 Chronic kidney disease, stage 3b: Secondary | ICD-10-CM | POA: Diagnosis not present

## 2023-03-18 LAB — CBC WITH DIFFERENTIAL (CANCER CENTER ONLY)
Abs Immature Granulocytes: 0.01 10*3/uL (ref 0.00–0.07)
Basophils Absolute: 0 10*3/uL (ref 0.0–0.1)
Basophils Relative: 0 %
Eosinophils Absolute: 0 10*3/uL (ref 0.0–0.5)
Eosinophils Relative: 0 %
HCT: 29.6 % — ABNORMAL LOW (ref 36.0–46.0)
Hemoglobin: 8.6 g/dL — ABNORMAL LOW (ref 12.0–15.0)
Immature Granulocytes: 0 %
Lymphocytes Relative: 10 %
Lymphs Abs: 0.3 10*3/uL — ABNORMAL LOW (ref 0.7–4.0)
MCH: 26.5 pg (ref 26.0–34.0)
MCHC: 29.1 g/dL — ABNORMAL LOW (ref 30.0–36.0)
MCV: 91.1 fL (ref 80.0–100.0)
Monocytes Absolute: 0.3 10*3/uL (ref 0.1–1.0)
Monocytes Relative: 11 %
Neutro Abs: 2.5 10*3/uL (ref 1.7–7.7)
Neutrophils Relative %: 79 %
Platelet Count: 188 10*3/uL (ref 150–400)
RBC: 3.25 MIL/uL — ABNORMAL LOW (ref 3.87–5.11)
RDW: 17.4 % — ABNORMAL HIGH (ref 11.5–15.5)
WBC Count: 3.1 10*3/uL — ABNORMAL LOW (ref 4.0–10.5)
nRBC: 0 % (ref 0.0–0.2)

## 2023-03-18 LAB — SAMPLE TO BLOOD BANK

## 2023-03-18 LAB — IRON AND IRON BINDING CAPACITY (CC-WL,HP ONLY)
Iron: 12 ug/dL — ABNORMAL LOW (ref 28–170)
Saturation Ratios: 3 % — ABNORMAL LOW (ref 10.4–31.8)
TIBC: 349 ug/dL (ref 250–450)
UIBC: 337 ug/dL (ref 148–442)

## 2023-03-18 LAB — CMP (CANCER CENTER ONLY)
ALT: 6 U/L (ref 0–44)
AST: 14 U/L — ABNORMAL LOW (ref 15–41)
Albumin: 4.2 g/dL (ref 3.5–5.0)
Alkaline Phosphatase: 53 U/L (ref 38–126)
Anion gap: 6 (ref 5–15)
BUN: 45 mg/dL — ABNORMAL HIGH (ref 8–23)
CO2: 31 mmol/L (ref 22–32)
Calcium: 9.4 mg/dL (ref 8.9–10.3)
Chloride: 102 mmol/L (ref 98–111)
Creatinine: 1.82 mg/dL — ABNORMAL HIGH (ref 0.44–1.00)
GFR, Estimated: 27 mL/min — ABNORMAL LOW (ref >60)
Glucose, Bld: 99 mg/dL (ref 70–99)
Potassium: 4.6 mmol/L (ref 3.5–5.1)
Sodium: 139 mmol/L (ref 135–145)
Total Bilirubin: 0.6 mg/dL (ref <1.2)
Total Protein: 8 g/dL (ref 6.5–8.1)

## 2023-03-18 LAB — FERRITIN: Ferritin: 16 ng/mL (ref 11–307)

## 2023-03-18 LAB — GENETIC SCREENING ORDER

## 2023-03-18 MED ORDER — METOLAZONE 2.5 MG PO TABS: 2.5000 mg | ORAL_TABLET | ORAL | 3 refills | Status: DC

## 2023-03-18 MED ORDER — POTASSIUM CHLORIDE CRYS ER 20 MEQ PO TBCR: 20.0000 meq | EXTENDED_RELEASE_TABLET | ORAL | 6 refills | Status: DC

## 2023-03-18 NOTE — Patient Instructions (Signed)
Thank you for coming in today  Labs were added to your lab appointment next week 03/26/2023  Medications: Start Metolazone 2.5 mg weekly with 40 meq of Potassium every Saturday   Follow up appointments:  Your physician recommends that you schedule a follow-up appointment in:  3-4 weeks in clinic   Do the following things EVERYDAY: Weigh yourself in the morning before breakfast. Write it down and keep it in a log. Take your medicines as prescribed Eat low salt foods--Limit salt (sodium) to 2000 mg per day.  Stay as active as you can everyday Limit all fluids for the day to less than 2 liters   At the [facility], you and your health needs are our priority. As part of our continuing mission to provide you with exceptional heart care, we have created designated Provider Care Teams. These Care Teams include your primary Cardiologist (physician) and Advanced Practice Providers (APPs- Physician Assistants and Nurse Practitioners) who all work together to provide you with the care you need, when you need it.   You may see any of the following providers on your designated Care Team at your next follow up: Dr Arvilla Meres Dr Marca Ancona Dr. Marcos Eke, NP Robbie Lis, [place] [facility] [place], [place] Brynda Peon, NP Karle Plumber, PharmD   Please be sure to bring in all your medications bottles to every appointment.    Thank you for choosing [facility]  If you have any questions or concerns before your next appointment please send [place] a message through Bud or call our office at [phone].    TO LEAVE A MESSAGE FOR THE NURSE SELECT OPTION 2, PLEASE LEAVE A MESSAGE INCLUDING: YOUR NAME DATE OF BIRTH CALL BACK NUMBER REASON FOR CALL**this is important as we prioritize the call backs  YOU WILL RECEIVE A CALL BACK THE SAME DAY AS LONG AS YOU CALL BEFORE 4:00 PM

## 2023-03-19 ENCOUNTER — Other Ambulatory Visit: Payer: Self-pay | Admitting: Nurse Practitioner

## 2023-03-19 ENCOUNTER — Inpatient Hospital Stay: Payer: PPO

## 2023-03-20 ENCOUNTER — Encounter: Payer: Self-pay | Admitting: *Deleted

## 2023-03-20 ENCOUNTER — Other Ambulatory Visit: Payer: Self-pay

## 2023-03-21 ENCOUNTER — Ambulatory Visit: Payer: PPO | Admitting: Gastroenterology

## 2023-03-24 ENCOUNTER — Ambulatory Visit: Payer: Self-pay

## 2023-03-24 DIAGNOSIS — D485 Neoplasm of uncertain behavior of skin: Secondary | ICD-10-CM | POA: Diagnosis not present

## 2023-03-24 NOTE — Patient Outreach (Signed)
  Care Coordination   03/24/2023 Name: [name] MRN: [id number] DOB: 1933-06-14   Care Coordination Outreach Attempts:  An unsuccessful telephone outreach was attempted for a scheduled appointment today. Unable to leave voice message due to phone only ringing.   Follow Up Plan:  Additional outreach attempts will be made to offer the patient care coordination information and services.   Encounter Outcome:  No Answer   Care Coordination Interventions:  No, not indicated    George Ina RN,BSN,CCM [facility]  [facility], [facility] coordinator / Case Manager Phone: [phone]

## 2023-03-26 ENCOUNTER — Inpatient Hospital Stay: Payer: PPO

## 2023-03-26 ENCOUNTER — Encounter: Payer: Self-pay | Admitting: Hematology

## 2023-03-26 ENCOUNTER — Inpatient Hospital Stay: Payer: PPO | Admitting: Hematology

## 2023-03-26 ENCOUNTER — Other Ambulatory Visit: Payer: Self-pay

## 2023-03-26 VITALS — BP 126/46 | HR 59 | Temp 97.7°F | Resp 18 | Ht 66.0 in | Wt 134.1 lb

## 2023-03-26 DIAGNOSIS — Z5112 Encounter for antineoplastic immunotherapy: Secondary | ICD-10-CM | POA: Diagnosis not present

## 2023-03-26 DIAGNOSIS — D500 Iron deficiency anemia secondary to blood loss (chronic): Secondary | ICD-10-CM | POA: Diagnosis not present

## 2023-03-26 DIAGNOSIS — K5520 Angiodysplasia of colon without hemorrhage: Secondary | ICD-10-CM

## 2023-03-26 DIAGNOSIS — D3A8 Other benign neuroendocrine tumors: Secondary | ICD-10-CM | POA: Diagnosis not present

## 2023-03-26 DIAGNOSIS — D649 Anemia, unspecified: Secondary | ICD-10-CM

## 2023-03-26 DIAGNOSIS — Z7689 Persons encountering health services in other specified circumstances: Secondary | ICD-10-CM | POA: Diagnosis not present

## 2023-03-26 LAB — SAMPLE TO BLOOD BANK

## 2023-03-26 LAB — CBC WITH DIFFERENTIAL (CANCER CENTER ONLY)
Abs Immature Granulocytes: 0.02 10*3/uL (ref 0.00–0.07)
Basophils Absolute: 0 10*3/uL (ref 0.0–0.1)
Basophils Relative: 1 %
Eosinophils Absolute: 0 10*3/uL (ref 0.0–0.5)
Eosinophils Relative: 0 %
HCT: 28.6 % — ABNORMAL LOW (ref 36.0–46.0)
Hemoglobin: 8.4 g/dL — ABNORMAL LOW (ref 12.0–15.0)
Immature Granulocytes: 1 %
Lymphocytes Relative: 9 %
Lymphs Abs: 0.3 10*3/uL — ABNORMAL LOW (ref 0.7–4.0)
MCH: 25.8 pg — ABNORMAL LOW (ref 26.0–34.0)
MCHC: 29.4 g/dL — ABNORMAL LOW (ref 30.0–36.0)
MCV: 87.7 fL (ref 80.0–100.0)
Monocytes Absolute: 0.4 10*3/uL (ref 0.1–1.0)
Monocytes Relative: 12 %
Neutro Abs: 3 10*3/uL (ref 1.7–7.7)
Neutrophils Relative %: 77 %
Platelet Count: 234 10*3/uL (ref 150–400)
RBC: 3.26 MIL/uL — ABNORMAL LOW (ref 3.87–5.11)
RDW: 18 % — ABNORMAL HIGH (ref 11.5–15.5)
WBC Count: 3.8 10*3/uL — ABNORMAL LOW (ref 4.0–10.5)
nRBC: 0 % (ref 0.0–0.2)

## 2023-03-26 LAB — TOTAL PROTEIN, URINE DIPSTICK: Protein, ur: NEGATIVE mg/dL

## 2023-03-26 MED ORDER — EPOETIN ALFA-EPBX 20000 UNIT/ML IJ SOLN
20000.0000 [IU] | Freq: Once | INTRAMUSCULAR | Status: AC
  Administered 2023-03-26: 20000 [IU] via SUBCUTANEOUS
  Filled 2023-03-26: qty 1

## 2023-03-26 MED ORDER — SODIUM CHLORIDE 0.9 % IV SOLN
5.0000 mg/kg | Freq: Once | INTRAVENOUS | Status: AC
  Administered 2023-03-26: 300 mg via INTRAVENOUS
  Filled 2023-03-26: qty 12

## 2023-03-26 MED ORDER — SODIUM CHLORIDE 0.9 % IV SOLN: Freq: Once | INTRAVENOUS | Status: AC

## 2023-03-26 NOTE — Assessment & Plan Note (Signed)
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 -She previously had reaction to blood transfusion, or required multiple premedications for blood transfusion -She was recently hospitalized for anemia and a GI bleeding in July 2024. -She is currently on ESA every 2 weeks, IV iron as needed, and blood transfusion as needed. -Due to her multiple hospital admission for anemia and GI bleeding, she started bevacizumab for AVM on 10/9, plan to give every 2 weeksX4

## 2023-03-26 NOTE — Progress Notes (Signed)
[facility]   [phone] Fax:([phone]   Clinic Follow up Note   Patient Care Team: [name], MD as PCP - General (Family Medicine) Duke Salvia, MD as PCP - Cardiology (Cardiology) Laurey Morale, MD as PCP - Advanced Heart Failure (Cardiology) Antonieta Iba, MD as Consulting Physician (Cardiology) Duke Salvia, MD as Consulting Physician (Cardiology) [name], Latina Craver, MD as Consulting Physician (Vascular Surgery) Creig Hines, MD as Consulting Physician (Hematology and Oncology) Malachy Mood, MD as Consulting Physician (Oncology) Kathyrn Sheriff, [facility] (Inactive) as Pharmacist (Pharmacist)  Date of Service:  03/26/2023  CHIEF COMPLAINT: f/u of anemia and neuroendocrine tumor  CURRENT THERAPY:  Bevacizumab Sandostatin every months  Oncology History   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   -Outpatient EUS 03/04/2022 by Dr. 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 Freida Busman did not recommend surgery in multifocal NET and also due to her age and co-morbidities. -She began first line sandostatin injections q28 days on 03/27/22.  -Dotatate PET scan, done 2 weeks after first injection 04/10/22, shows no significant tracer avidity in the pancrease or distant sites. Findings are likely due to close timing relative to the sandostatin injection blocking receptors. When we repeat this for restaging next year, will arrange 4 weeks after sandostatin -She has  persistent anemia, likely secondary to GI bleeding from the tumor and AVM, she has had multiple hospital admission for anemia and bleeding.     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 -She previously had reaction to blood transfusion, or required multiple premedications for blood transfusion -She was recently hospitalized for anemia and a GI bleeding in July 2024. -She is currently on ESA every 2 weeks, IV iron as needed, and blood transfusion as needed. -Due to her multiple hospital admission for anemia and GI bleeding, she started bevacizumab for AVM on 10/9, plan to give every 2 weeksX4    Assessment and Plan    Neuroendocrine Tumor [age]-year-old with a slow-growing neuroendocrine tumor, currently asymptomatic. Receiving monthly injections; last injection on November 7th. Experiencing increased upper body pain unrelated to current treatment. - Continue monthly injections - Schedule next injection as per routine  Skin Cancer Painful skin cancer lesion in the left foot. Dermatologist recommended surgical removal, resulting in a large, painful wound with a five-month healing period. Significant pain from the lesion. Surgery advised three to four weeks post today's infusion. Bevacizumab infusions to be held during healing to avoid delayed wound healing. Will continued monthly injections for neuroendocrine tumor during healing. - Request dermatologist consultation - Advise surgery three to four weeks post-infusion today, I plan to talk to her dermatologist - Hold bevacizumab infusions if surgery is performed - Continue monthly injections for  neuroendocrine  tumor during healing  Anemia of blood loss and IDA Chronic anemia with hemoglobin levels around 8-8.5 g/dL, currently at 8.4 g/dL. No transfusions required since starting current regimen. Receiving weekly bevacizumab infusions to manage anemia and prevent bleeding. Bevacizumab infusions to be held if surgery for skin cancer is performed due to delayed wound healing. - Administer today's bevacizumab infusion - Monitor hemoglobin bi-weekly - Consider transfusion if hemoglobin drops below 8 g/dL - Hold bevacizumab infusions if surgery is performed until wound heals  Plan -Will proceed last planned bevacizumab infusion today, then hold afterwards -Patient would need a surgical resection for her skin cancer in left foot, I will talk to her dermatologist -Lab every 2 weeks to see if she needs blood transfusion -Next Sandostatin injection in 2 weeks -Follow-up in 4 weeks   SUMMARY OF ONCOLOGIC HISTORY: Oncology History   No history exists.     Discussed the use of AI scribe software for clinical note transcription with the patient, who gave verbal consent to proceed.  History of Present Illness   The patient, an 89 year old female with a history of neuroendocrine tumor and anemia, presents for follow-up. She reports no new issues in the past two weeks, except for a painful skin lesion. The lesion has been diagnosed as skin cancer by a dermatologist. The patient has had the lesion removed once before. The patient reports that the lesion is causing significant discomfort and pain. She is considering having it removed again, but is concerned about the healing process, as she has been informed that it could take up to five months and be very painful. The patient also reports increased pain in her upper body in the past two to three weeks. She is currently receiving infusions for anemia and injections for her neuroendocrine tumor.         All other systems were reviewed with the patient and are  negative.  MEDICAL HISTORY:  Past Medical History:  Diagnosis Date   (HFpEF) heart failure with preserved ejection fraction (HCC)    EF=60-60%   Actinic keratosis 01/17/2015   R forearm   Adjustment disorder with anxiety    Adverse effect of other narcotics, sequela    Intolerance to all narcotics   Anti-Duffy antibodies present    Aortic valve stenosis    Arthritis    "some in my hands" (11/11/2012)   Atrial fibrillation, permanent (HCC)    Eliquis   Atypical mole 03/25/2018   L forearm - severe   Automatic implantable cardioverter-defibrillator in situ    Avascular necrosis of hip (HCC) 05/03/2011   Carotid artery stenosis 09/2007   a. 09/2007: 60-79% bilateral (stable); b. 10/2008: 40-59% R 60-79%    Cellulitis of left lower extremity 07/13/2020   CKD (chronic kidney disease), stage III (HCC)    COPD (chronic obstructive pulmonary disease) (HCC)    Coronary artery disease    non-obstructive by 2006 cath   COVID-19 virus infection 02/12/2022   Displaced fracture of left femoral neck (HCC) 10/09/2018   GI bleed 03/28/2020   AVM   High cholesterol    Hypertension 05/20/2011   Hypertr obst cardiomyop    Hypotension, unspecified    cardiac cath 2006..nonobstructive CAD 30-40s lesions.Marland KitchenETT 1/09 nondiagnostic due to poor HR response..Right Renal Cancer 2003   Iron deficiency anemia    Long term (current) use of anticoagulants    On home oxygen therapy    3L [place]   Osteoarthritis of right hip    PAD (peripheral  artery disease) (HCC)    Pancreatic cancer (HCC)    sandostatin   PONV (postoperative nausea and vomiting)    Presence of permanent cardiac pacemaker    Pulmonary HTN (HCC)    RECTAL BLEEDING 10/13/2009   Qualifier: Diagnosis of  By: Wilmon Pali NP, Paula     Red blood cell antibody positive with compatible PRBC difficult to obtain    Anti FYA (Duffy a) antibody. Must be transfused with PRB which are Duffy Antigen Negative and Crossmatch Compatible   Renal cell carcinoma  (HCC)    s/p nephrectomy   Squamous cell carcinoma of skin 01/17/2015   R lat wrist   Squamous cell carcinoma of skin 01/29/2018   R post upper leg - superficially invasive   Squamous cell carcinoma of skin 03/25/2018   L lat foot   Squamous cell carcinoma of skin 11/09/2020   left lat foot - EDC 01/01/21, recurrent 01/31/21 - MOHs 02/22/21   Squamous cell carcinoma of skin 12/19/2021   Right Posterior Medial Thigh, EDC   Squamous cell carcinoma of skin 12/19/2021   SCC IS, L lat heel, EDC 02/04/2022   Squamous cell carcinoma of skin 01/21/2022   L forearm, EDC 02/04/2022   Squamous cell carcinoma of skin 01/21/2022   R lower leg below knee, EDC 02/04/2022   Squamous cell carcinoma of skin 01/21/2022   SCCIS, R post heel, EDC 02/04/2022   Squamous cell carcinoma of skin 07/01/2022   left lower abdomen, in situ, EDC   Squamous cell carcinoma of skin 07/01/2022   left medial chest, EDC   Urge incontinence    Venous stasis of both lower extremities     SURGICAL HISTORY: Past Surgical History:  Procedure Laterality Date   ABDOMINAL AORTOGRAM W/LOWER EXTREMITY N/A 02/12/2021   Procedure: ABDOMINAL AORTOGRAM W/LOWER EXTREMITY;  Surgeon: Maeola Harman, MD;  Location: [facility] INVASIVE CV LAB;  Service: Cardiovascular;  Laterality: N/A;   ABDOMINAL HYSTERECTOMY  1975   for benign causes   APPENDECTOMY     BI-VENTRICULAR PACEMAKER UPGRADE  05/04/2010   BIOPSY  02/28/2019   Procedure: BIOPSY;  Surgeon: Rachael Fee, MD;  Location: [facility] ENDOSCOPY;  Service: Endoscopy;;   BIOPSY  03/04/2022   Procedure: BIOPSY;  Surgeon: Lemar Lofty., MD;  Location: [facility] ENDOSCOPY;  Service: Gastroenterology;;   CARDIAC CATHETERIZATION  2006   CARDIOVERSION N/A 02/20/2018   Procedure: CARDIOVERSION;  Surgeon: Antonieta Iba, MD;  Location: [facility] ORS;  Service: Cardiovascular;  Laterality: N/A;   CARDIOVERSION N/A 03/27/2018   Procedure: CARDIOVERSION;  Surgeon: Antonieta Iba,  MD;  Location: [facility] ORS;  Service: Cardiovascular;  Laterality: N/A;   CATARACT EXTRACTION W/ INTRAOCULAR LENS  IMPLANT, BILATERAL  01/2006-02-2006   CHOLECYSTECTOMY N/A 11/11/2012   Procedure: LAPAROSCOPIC CHOLECYSTECTOMY WITH INTRAOPERATIVE CHOLANGIOGRAM;  Surgeon: Wilmon Arms. Corliss Skains, MD;  Location: [facility] OR;  Service: General;  Laterality: N/A;   COLONOSCOPY WITH PROPOFOL N/A 02/28/2019   Procedure: COLONOSCOPY WITH PROPOFOL;  Surgeon: Rachael Fee, MD;  Location: [facility] ENDOSCOPY;  Service: Endoscopy;  Laterality: N/A;   ENTEROSCOPY N/A 03/30/2020   Procedure: ENTEROSCOPY;  Surgeon: Shellia Cleverly, DO;  Location: [facility] ENDOSCOPY;  Service: Gastroenterology;  Laterality: N/A;   ENTEROSCOPY N/A 02/10/2021   Procedure: ENTEROSCOPY;  Surgeon: Jeani Hawking, MD;  Location: [facility] ENDOSCOPY;  Service: Endoscopy;  Laterality: N/A;   ENTEROSCOPY N/A 02/15/2022   Procedure: ENTEROSCOPY;  Surgeon: Imogene Burn, MD;  Location: [facility] ENDOSCOPY;  Service: Gastroenterology;  Laterality: N/A;  ENTEROSCOPY N/A 11/06/2022   Procedure: ENTEROSCOPY;  Surgeon: Mansouraty, Netty Starring., MD;  Location: [facility] ENDOSCOPY;  Service: Gastroenterology;  Laterality: N/A;   EP IMPLANTABLE DEVICE N/A 02/21/2016   Procedure: ICD Generator Changeout;  Surgeon: Duke Salvia, MD;  Location: [facility] INVASIVE CV LAB;  Service: Cardiovascular;  Laterality: N/A;   ESOPHAGOGASTRODUODENOSCOPY N/A 03/04/2022   Procedure: ESOPHAGOGASTRODUODENOSCOPY (EGD);  Surgeon: Lemar Lofty., MD;  Location: [facility] ENDOSCOPY;  Service: Gastroenterology;  Laterality: N/A;   ESOPHAGOGASTRODUODENOSCOPY (EGD) WITH PROPOFOL N/A 02/28/2019   Procedure: ESOPHAGOGASTRODUODENOSCOPY (EGD) WITH PROPOFOL;  Surgeon: Rachael Fee, MD;  Location: [facility] ENDOSCOPY;  Service: Endoscopy;  Laterality: N/A;   EUS N/A 03/04/2022   Procedure: UPPER ENDOSCOPIC ULTRASOUND (EUS) RADIAL;  Surgeon: Lemar Lofty., MD;  Location: [facility] ENDOSCOPY;  Service: Gastroenterology;   Laterality: N/A;   FINE NEEDLE ASPIRATION N/A 03/04/2022   Procedure: FINE NEEDLE ASPIRATION (FNA) LINEAR;  Surgeon: Lemar Lofty., MD;  Location: [facility] ENDOSCOPY;  Service: Gastroenterology;  Laterality: N/A;   GIVENS CAPSULE STUDY N/A 03/15/2019   Procedure: GIVENS CAPSULE STUDY;  Surgeon: Tressia Danas, MD;  Location: [facility] ENDOSCOPY;  Service: Gastroenterology;  Laterality: N/A;   HOT HEMOSTASIS N/A 03/30/2020   Procedure: HOT HEMOSTASIS (ARGON PLASMA COAGULATION/BICAP);  Surgeon: Shellia Cleverly, DO;  Location: [facility] ENDOSCOPY;  Service: Gastroenterology;  Laterality: N/A;   HOT HEMOSTASIS N/A 02/10/2021   Procedure: HOT HEMOSTASIS (ARGON PLASMA COAGULATION/BICAP);  Surgeon: Jeani Hawking, MD;  Location: [facility] ENDOSCOPY;  Service: Endoscopy;  Laterality: N/A;   HOT HEMOSTASIS N/A 02/15/2022   Procedure: HOT HEMOSTASIS (ARGON PLASMA COAGULATION/BICAP);  Surgeon: Imogene Burn, MD;  Location: [facility] ENDOSCOPY;  Service: Gastroenterology;  Laterality: N/A;   HOT HEMOSTASIS N/A 11/06/2022   Procedure: HOT HEMOSTASIS (ARGON PLASMA COAGULATION/BICAP);  Surgeon: Lemar Lofty., MD;  Location: [facility] ENDOSCOPY;  Service: Gastroenterology;  Laterality: N/A;   INSERT / REPLACE / REMOVE PACEMAKER  05-01-11   02-28-05-/05-04-10-ICD-MEDTRONIC MAXIMAL DR   IR ANGIOGRAM SELECTIVE EACH ADDITIONAL VESSEL  02/18/2022   IR ANGIOGRAM SELECTIVE EACH ADDITIONAL VESSEL  02/18/2022   IR ANGIOGRAM VISCERAL SELECTIVE  02/16/2022   IR ANGIOGRAM VISCERAL SELECTIVE  02/16/2022   IR EMBO ART  VEN HEMORR LYMPH EXTRAV  INC GUIDE ROADMAPPING  02/16/2022   IR THORACENTESIS ASP PLEURAL SPACE W/IMG GUIDE  02/21/2022   IR THORACENTESIS ASP PLEURAL SPACE W/IMG GUIDE  02/22/2022   IR US GUIDE VASC ACCESS RIGHT  02/16/2022   JOINT REPLACEMENT     LAPAROSCOPIC CHOLECYSTECTOMY  11/11/2012   LAPAROSCOPIC LYSIS OF ADHESIONS N/A 11/11/2012   Procedure: LAPAROSCOPIC LYSIS OF ADHESIONS;  Surgeon: Wilmon Arms. Corliss Skains, MD;  Location: [facility]  OR;  Service: General;  Laterality: N/A;   NEPHRECTOMY Right 06/2001    S/P RENAL CELL CANCER   PERIPHERAL VASCULAR INTERVENTION Bilateral 02/12/2021   Procedure: PERIPHERAL VASCULAR INTERVENTION;  Surgeon: Maeola Harman, MD;  Location: [facility] INVASIVE CV LAB;  Service: Cardiovascular;  Laterality: Bilateral;  Iliac artery stents   PRESSURE SENSOR/CARDIOMEMS N/A 02/03/2019   Procedure: PRESSURE SENSOR/CARDIOMEMS;  Surgeon: Laurey Morale, MD;  Location: [facility] INVASIVE CV LAB;  Service: Cardiovascular;  Laterality: N/A;   RIGHT HEART CATH N/A 11/09/2018   Procedure: RIGHT HEART CATH;  Surgeon: Laurey Morale, MD;  Location: [facility] INVASIVE CV LAB;  Service: Cardiovascular;  Laterality: N/A;   RIGHT HEART CATH N/A 03/08/2019   Procedure: RIGHT HEART CATH;  Surgeon: Laurey Morale, MD;  Location: [facility] INVASIVE CV LAB;  Service: Cardiovascular;  Laterality: N/A;  SUBMUCOSAL TATTOO INJECTION  02/28/2019   Procedure: SUBMUCOSAL TATTOO INJECTION;  Surgeon: Rachael Fee, MD;  Location: [facility] ENDOSCOPY;  Service: Endoscopy;;   SUBMUCOSAL TATTOO INJECTION  11/06/2022   Procedure: SUBMUCOSAL TATTOO INJECTION;  Surgeon: Lemar Lofty., MD;  Location: [facility] ENDOSCOPY;  Service: Gastroenterology;;   TOTAL HIP ARTHROPLASTY Right 05/03/2011   Procedure: TOTAL HIP ARTHROPLASTY ANTERIOR APPROACH;  Surgeon: Kathryne Hitch;  Location: [facility] ORS;  Service: Orthopedics;  Laterality: Right;  Removal of Cannulated Screws Right Hip, Right Direct Anterior Hip Replacement   TOTAL HIP ARTHROPLASTY Left 10/09/2018   Procedure: TOTAL HIP ARTHROPLASTY ANTERIOR APPROACH;  Surgeon: Samson Frederic, MD;  Location: [facility] OR;  Service: Orthopedics;  Laterality: Left;    I have reviewed the social history and family history with the patient and they are unchanged from previous note.  ALLERGIES:  is allergic to codeine, amoxil [amoxicillin], asa [aspirin], ms contin [morphine], neurontin [gabapentin], and  nsaids.  MEDICATIONS:  Current Outpatient Medications  Medication Sig Dispense Refill   acetaminophen (TYLENOL) 500 MG tablet Take 1,000 mg by mouth daily.     Ascorbic Acid (VITAMIN C PO) Take 1 tablet by mouth daily.     Cholecalciferol (VITAMIN D-3 PO) Take 2 capsules by mouth daily.     Cyanocobalamin (B-12 PO) Take 1 tablet by mouth daily.     ferrous gluconate (FERGON) 324 MG tablet Take 1 tablet (324 mg total) by mouth daily with breakfast. 30 tablet 3   fluticasone (FLONASE) 50 MCG/ACT nasal spray Place 2 sprays into both nostrils daily as needed for allergies or rhinitis. 16 g 0   metolazone (ZAROXOLYN) 2.5 MG tablet Take 1 tablet (2.5 mg total) by mouth once a week. Every Saturday with 40 meq of potassium 90 tablet 3   Multiple Vitamins-Minerals (HAIR SKIN NAILS PO) Take 1 capsule by mouth daily.     pantoprazole (PROTONIX) 40 MG tablet Take 1 tablet (40 mg total) by mouth daily. 30 tablet 3   potassium chloride SA (KLOR-CON M) 20 MEQ tablet Take 2 tablets (40 mEq total) by mouth every morning AND 1 tablet (20 mEq total) at bedtime. Take extra 2 tabs when you take metolazone. 110 tablet 6   potassium chloride SA (KLOR-CON M) 20 MEQ tablet Take 1 tablet (20 mEq total) by mouth once a week. Weekly with Metolazone dose every Saturday 15 tablet 6   rosuvastatin (CRESTOR) 40 MG tablet Take 1 tablet (40 mg total) by mouth daily. (Patient taking differently: Take 40 mg by mouth every evening.) 30 tablet 11   torsemide (DEMADEX) 20 MG tablet Take 5 tablets (100 mg total) by mouth 2 (two) times daily.     No current facility-administered medications for this visit.    PHYSICAL EXAMINATION: ECOG PERFORMANCE STATUS: 3 - Symptomatic, >50% confined to bed  Vitals:   03/26/23 1332  BP: (!) 126/46  Pulse: (!) 59  Resp: 18  Temp: 97.7 F (36.5 C)  SpO2: 100%   Wt Readings from Last 3 Encounters:  03/26/23 134 lb 1.6 oz (60.8 kg)  03/18/23 136 lb (61.7 kg)  03/13/23 132 lb 11.2 oz (60.2  kg)     GENERAL:alert, no distress and comfortable SKIN: skin color, texture, turgor are normal, no rashes or significant lesions except a large (3cm) dark skin lesion on left top foot  EYES: normal, Conjunctiva are pink and non-injected, sclera clear NECK: supple, thyroid normal size, non-tender, without nodularity LYMPH:  no palpable lymphadenopathy in the cervical, axillary  LUNGS: clear to auscultation and percussion with normal breathing effort HEART: regular rate & rhythm and no murmurs and no lower extremity edema ABDOMEN:abdomen soft, non-tender and normal bowel sounds Musculoskeletal:no cyanosis of digits and no clubbing  NEURO: alert & oriented x 3 with fluent speech, no focal motor/sensory deficits          LABORATORY DATA:  I have reviewed the data as listed    Latest Ref Rng & Units 03/26/2023   12:47 PM 03/18/2023    1:31 PM 03/13/2023   12:46 PM  CBC  WBC 4.0 - 10.5 K/uL 3.8  3.1  3.1   Hemoglobin 12.0 - 15.0 g/dL 8.4  8.6  8.4   Hematocrit 36.0 - 46.0 % 28.6  29.6  28.5   Platelets 150 - 400 K/uL 234  188  169         Latest Ref Rng & Units 03/18/2023    1:31 PM 03/13/2023   12:46 PM 03/05/2023    1:39 PM  CMP  Glucose 70 - 99 mg/dL 99  75  65   BUN 8 - 23 mg/dL 45  51  37   Creatinine 0.44 - 1.00 mg/dL 8.65  7.84  6.96   Sodium 135 - 145 mmol/L 139  139  138   Potassium 3.5 - 5.1 mmol/L 4.6  4.6  4.1   Chloride 98 - 111 mmol/L 102  99  102   CO2 22 - 32 mmol/L 31  34  29   Calcium 8.9 - 10.3 mg/dL 9.4  9.3  9.2   Total Protein 6.5 - 8.1 g/dL 8.0  7.8  7.5   Total Bilirubin <1.2 mg/dL 0.6  0.5  0.7   Alkaline Phos 38 - 126 U/L 53  48  45   AST 15 - 41 U/L 14  15  15    ALT 0 - 44 U/L 6  7  5        RADIOGRAPHIC STUDIES: I have personally reviewed the radiological images as listed and agreed with the findings in the report. No results found.    No orders of the defined types were placed in this encounter.  All questions were answered. The  patient knows to call the clinic with any problems, questions or concerns. No barriers to learning was detected. The total time spent in the appointment was 30 minutes.     Malachy Mood, MD 03/26/2023

## 2023-03-26 NOTE — Assessment & Plan Note (Signed)
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   -Outpatient EUS 03/04/2022 by Dr. 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 Freida Busman did not recommend surgery in multifocal NET and also due to her age and co-morbidities. -She began first line sandostatin injections q28 days on 03/27/22.  -Dotatate PET scan, done 2 weeks after first injection 04/10/22, shows no significant tracer avidity in the pancrease or distant sites. Findings are likely due to close timing relative to the sandostatin injection blocking receptors. When we repeat this for restaging next year, will arrange 4 weeks after sandostatin -She has persistent anemia, likely secondary to GI bleeding from the tumor and AVM, she has had multiple hospital admission for anemia and bleeding.

## 2023-03-26 NOTE — Patient Instructions (Signed)
 [name] - A DEPT OF [name]  Discharge Instructions: Thank you for choosing [name] to provide your oncology and hematology care.   If you have a lab appointment with the [name], please go directly to the [name] and check in at the registration area.   Wear comfortable clothing and clothing appropriate for easy access to any Portacath or PICC line.   We strive to give you quality time with your provider. You may need to reschedule your appointment if you arrive late (15 or more minutes).  Arriving late affects you and other patients whose appointments are after yours.  Also, if you miss three or more appointments without notifying the office, you may be dismissed from the clinic at the provider's discretion.      For prescription refill requests, have your pharmacy contact our office and allow 72 hours for refills to be completed.    Today you received the following chemotherapy and/or immunotherapy agents Bevacizumab   To help prevent nausea and vomiting after your treatment, we encourage you to take your nausea medication as directed.  BELOW ARE SYMPTOMS THAT SHOULD BE REPORTED IMMEDIATELY: *FEVER GREATER THAN 100.4 F (38 C) OR HIGHER *CHILLS OR SWEATING *NAUSEA AND VOMITING THAT IS NOT CONTROLLED WITH YOUR NAUSEA MEDICATION *UNUSUAL SHORTNESS OF BREATH *UNUSUAL BRUISING OR BLEEDING *URINARY PROBLEMS (pain or burning when urinating, or frequent urination) *BOWEL PROBLEMS (unusual diarrhea, constipation, pain near the anus) TENDERNESS IN MOUTH AND THROAT WITH OR WITHOUT PRESENCE OF ULCERS (sore throat, sores in mouth, or a toothache) UNUSUAL RASH, SWELLING OR PAIN  UNUSUAL VAGINAL DISCHARGE OR ITCHING   Items with * indicate a potential emergency and should be followed up as soon as possible or go to the Emergency Department if any problems should occur.  Please show the CHEMOTHERAPY ALERT CARD or IMMUNOTHERAPY  ALERT CARD at check-in to the Emergency Department and triage nurse.  Should you have questions after your visit or need to cancel or reschedule your appointment, please contact [name] - A DEPT OF [facility]  Dept: [phone]  and follow the prompts.  Office hours are 8:00 a.m. to 4:30 p.m. Monday - Friday. Please note that voicemails left after 4:00 p.m. may not be returned until the following business day.  We are closed weekends and major holidays. You have access to a nurse at all times for urgent questions. Please call the main number to the clinic Dept: [phone] and follow the prompts.   For any non-urgent questions, you may also contact your provider using MyChart. We now offer e-Visits for anyone 58 and older to request care online for non-urgent symptoms. For details visit [email or web address].   Also download the MyChart app! Go to the app store, search "MyChart", open the app, select [facility], and log in with your MyChart username and password.   Bevacizumab Injection What is this medication? BEVACIZUMAB (be va SIZ yoo mab) treats some types of cancer. It works by blocking a protein that causes cancer cells to grow and multiply. This helps to slow or stop the spread of cancer cells. It is a monoclonal antibody. This medicine may be used for other purposes; ask your health care provider or pharmacist if you have questions. COMMON BRAND NAME(S): Alymsys, Avastin, MVASI, Omer Jack What should I tell my care team before I take this medication? They need to know if you have any of these conditions: Blood clots  Coughing up blood Having or recent surgery Heart failure High blood pressure History of a connection between 2 or more body parts that do not usually connect (fistula) History of a tear in your stomach or intestines Protein in your urine An unusual or allergic reaction to bevacizumab, other medications, foods, dyes, or  preservatives Pregnant or trying to get pregnant Breast-feeding How should I use this medication? This medication is injected into a vein. It is given by your care team in a hospital or clinic setting. Talk to your care team the use of this medication in children. Special care may be needed. Overdosage: If you think you have taken too much of this medicine contact a poison control [name] or emergency room at once. NOTE: This medicine is only for you. Do not share this medicine with others. What if I miss a dose? Keep appointments for follow-up doses. It is important not to miss your dose. Call your care team if you are unable to keep an appointment. What may interact with this medication? Interactions are not expected. This list may not describe all possible interactions. Give your health care provider a list of all the medicines, herbs, non-prescription drugs, or dietary supplements you use. Also tell them if you smoke, drink alcohol, or use illegal drugs. Some items may interact with your medicine. What should I watch for while using this medication? Your condition will be monitored carefully while you are receiving this medication. You may need blood work while taking this medication. This medication may make you feel generally unwell. This is not uncommon as chemotherapy can affect healthy cells as well as cancer cells. Report any side effects. Continue your course of treatment even though you feel ill unless your care team tells you to stop. This medication may increase your risk to bruise or bleed. Call your care team if you notice any unusual bleeding. Before having surgery, talk to your care team to make sure it is ok. This medication can increase the risk of poor healing of your surgical site or wound. You will need to stop this medication for 28 days before surgery. After surgery, wait at least 28 days before restarting this medication. Make sure the surgical site or wound is healed enough  before restarting this medication. Talk to your care team if questions. Talk to your care team if you may be pregnant. Serious birth defects can occur if you take this medication during pregnancy and for 6 months after the last dose. Contraception is recommended while taking this medication and for 6 months after the last dose. Your care team can help you find the option that works for you. Do not breastfeed while taking this medication and for 6 months after the last dose. This medication can cause infertility. Talk to your care team if you are concerned about your fertility. What side effects may I notice from receiving this medication? Side effects that you should report to your care team as soon as possible: Allergic reactions--skin rash, itching, hives, swelling of the face, lips, tongue, or throat Bleeding--bloody or black, tar-like stools, vomiting blood or brown material that looks like coffee grounds, red or dark brown urine, small red or purple spots on skin, unusual bruising or bleeding Blood clot--pain, swelling, or warmth in the leg, shortness of breath, chest pain Heart attack--pain or tightness in the chest, shoulders, arms, or jaw, nausea, shortness of breath, cold or clammy skin, feeling faint or lightheaded Heart failure--shortness of breath, swelling of the ankles, feet,  or hands, sudden weight gain, unusual weakness or fatigue Increase in blood pressure Infection--fever, chills, cough, sore throat, wounds that don't heal, pain or trouble when passing urine, general feeling of discomfort or being unwell Infusion reactions--chest pain, shortness of breath or trouble breathing, feeling faint or lightheaded Kidney injury--decrease in the amount of urine, swelling of the ankles, hands, or feet Stomach pain that is severe, does not go away, or gets worse Stroke--sudden numbness or weakness of the face, arm, or leg, trouble speaking, confusion, trouble walking, loss of balance or  coordination, dizziness, severe headache, change in vision Sudden and severe headache, confusion, change in vision, seizures, which may be signs of posterior reversible encephalopathy syndrome (PRES) Side effects that usually do not require medical attention (report to your care team if they continue or are bothersome): Back pain Change in taste Diarrhea Dry skin Increased tears Nosebleed This list may not describe all possible side effects. Call your doctor for medical advice about side effects. You may report side effects to FDA at 1-800-FDA-1088. Where should I keep my medication? This medication is given in a hospital or clinic. It will not be stored at home. NOTE: This sheet is a summary. It may not cover all possible information. If you have questions about this medicine, talk to your doctor, pharmacist, or health care provider.  2024 Elsevier/Gold Standard (2021-09-07 00:00:00)

## 2023-03-28 ENCOUNTER — Telehealth: Payer: Self-pay | Admitting: Hematology

## 2023-03-29 DIAGNOSIS — J449 Chronic obstructive pulmonary disease, unspecified: Secondary | ICD-10-CM | POA: Diagnosis not present

## 2023-03-29 DIAGNOSIS — I5033 Acute on chronic diastolic (congestive) heart failure: Secondary | ICD-10-CM | POA: Diagnosis not present

## 2023-03-30 ENCOUNTER — Encounter: Payer: Self-pay | Admitting: Hematology

## 2023-04-01 LAB — BASIC METABOLIC PANEL
BUN/Creatinine Ratio: 31 — ABNORMAL HIGH (ref 12–28)
BUN: 56 mg/dL — ABNORMAL HIGH (ref 8–27)
CO2: 25 mmol/L (ref 20–29)
Calcium: 9.2 mg/dL (ref 8.7–10.3)
Chloride: 94 mmol/L — ABNORMAL LOW (ref 96–106)
Creatinine, Ser: 1.78 mg/dL — ABNORMAL HIGH (ref 0.57–1.00)
Glucose: 118 mg/dL — ABNORMAL HIGH (ref 70–99)
Potassium: 4.4 mmol/L (ref 3.5–5.2)
Sodium: 136 mmol/L (ref 134–144)
eGFR: 27 mL/min/{1.73_m2} — ABNORMAL LOW (ref >59)

## 2023-04-01 LAB — BRAIN NATRIURETIC PEPTIDE: BNP: 268.9 pg/mL — ABNORMAL HIGH (ref 0.0–100.0)

## 2023-04-01 LAB — SPECIMEN STATUS REPORT

## 2023-04-01 NOTE — Assessment & Plan Note (Signed)
Ordering Afflovest therapy, AVAPS-AE and NIV with AVAPS-AE as per above.

## 2023-04-06 ENCOUNTER — Encounter: Payer: Self-pay | Admitting: Hematology

## 2023-04-07 NOTE — Progress Notes (Signed)
Remote ICD transmission.   

## 2023-04-08 ENCOUNTER — Other Ambulatory Visit: Payer: Self-pay

## 2023-04-09 ENCOUNTER — Other Ambulatory Visit: Payer: Self-pay

## 2023-04-09 ENCOUNTER — Inpatient Hospital Stay: Payer: PPO

## 2023-04-09 ENCOUNTER — Inpatient Hospital Stay: Payer: PPO | Attending: Nurse Practitioner

## 2023-04-09 VITALS — BP 132/44 | HR 60 | Resp 17

## 2023-04-09 DIAGNOSIS — D3A8 Other benign neuroendocrine tumors: Secondary | ICD-10-CM

## 2023-04-09 DIAGNOSIS — D500 Iron deficiency anemia secondary to blood loss (chronic): Secondary | ICD-10-CM

## 2023-04-09 DIAGNOSIS — N1832 Chronic kidney disease, stage 3b: Secondary | ICD-10-CM | POA: Insufficient documentation

## 2023-04-09 DIAGNOSIS — D649 Anemia, unspecified: Secondary | ICD-10-CM

## 2023-04-09 DIAGNOSIS — D631 Anemia in chronic kidney disease: Secondary | ICD-10-CM | POA: Insufficient documentation

## 2023-04-09 DIAGNOSIS — K5520 Angiodysplasia of colon without hemorrhage: Secondary | ICD-10-CM

## 2023-04-09 LAB — CBC WITH DIFFERENTIAL (CANCER CENTER ONLY)
Abs Immature Granulocytes: 0.02 10*3/uL (ref 0.00–0.07)
Basophils Absolute: 0 10*3/uL (ref 0.0–0.1)
Basophils Relative: 1 %
Eosinophils Absolute: 0 10*3/uL (ref 0.0–0.5)
Eosinophils Relative: 0 %
HCT: 30.6 % — ABNORMAL LOW (ref 36.0–46.0)
Hemoglobin: 8.7 g/dL — ABNORMAL LOW (ref 12.0–15.0)
Immature Granulocytes: 1 %
Lymphocytes Relative: 9 %
Lymphs Abs: 0.4 10*3/uL — ABNORMAL LOW (ref 0.7–4.0)
MCH: 24.4 pg — ABNORMAL LOW (ref 26.0–34.0)
MCHC: 28.4 g/dL — ABNORMAL LOW (ref 30.0–36.0)
MCV: 86 fL (ref 80.0–100.0)
Monocytes Absolute: 0.6 10*3/uL (ref 0.1–1.0)
Monocytes Relative: 14 %
Neutro Abs: 3.2 10*3/uL (ref 1.7–7.7)
Neutrophils Relative %: 75 %
Platelet Count: 190 10*3/uL (ref 150–400)
RBC: 3.56 MIL/uL — ABNORMAL LOW (ref 3.87–5.11)
RDW: 17.9 % — ABNORMAL HIGH (ref 11.5–15.5)
WBC Count: 4.2 10*3/uL (ref 4.0–10.5)
nRBC: 0 % (ref 0.0–0.2)

## 2023-04-09 LAB — SAMPLE TO BLOOD BANK

## 2023-04-09 MED ORDER — EPOETIN ALFA-EPBX 20000 UNIT/ML IJ SOLN
20000.0000 [IU] | Freq: Once | INTRAMUSCULAR | Status: AC
  Administered 2023-04-09: 20000 [IU] via SUBCUTANEOUS
  Filled 2023-04-09: qty 1

## 2023-04-09 MED ORDER — OCTREOTIDE ACETATE 20 MG IM KIT
20.0000 mg | PACK | Freq: Once | INTRAMUSCULAR | Status: AC
Start: 2023-04-09 — End: 2023-04-09
  Administered 2023-04-09: 20 mg via INTRAMUSCULAR
  Filled 2023-04-09: qty 1

## 2023-04-10 ENCOUNTER — Telehealth: Payer: Self-pay | Admitting: *Deleted

## 2023-04-10 ENCOUNTER — Encounter: Payer: Self-pay | Admitting: Internal Medicine

## 2023-04-10 NOTE — Progress Notes (Signed)
  Care Coordination Note  04/10/2023 Name: [name] MRN: [id number] DOB: 07-10-1933  [name] is a 89 y.o. year old female who is a primary care patient of [name], MD and is actively engaged with the care management team. I reached out to Franklyn Lor by phone today to assist with re-scheduling a follow up visit with the RN Case Manager  Follow up plan: Unsuccessful telephone outreach attempt made. A HIPAA compliant phone message was left for the patient providing contact information and requesting a return call.   Burman Nieves, CCMA Care Coordination Care Guide Direct Dial: [phone]

## 2023-04-14 NOTE — Progress Notes (Signed)
  Care Coordination Note  04/14/2023 Name: [name] MRN: [id number] DOB: May 20, 1933  [name] is a 89 y.o. year old female who is a primary care patient of [name], MD and is actively engaged with the care management team. I reached out to Franklyn Lor by phone today to assist with re-scheduling an initial visit with the RN Case Manager  Follow up plan: Telephone appointment with care management team member scheduled for:05/19/2023  Burman Nieves, [facility] Care Coordination Care Guide Direct Dial: [phone]

## 2023-04-15 ENCOUNTER — Ambulatory Visit (HOSPITAL_COMMUNITY)
Admission: RE | Admit: 2023-04-15 | Discharge: 2023-04-15 | Disposition: A | Discharge status: Home / Self Care | Payer: PPO | Source: Ambulatory Visit | Attending: Cardiology | Admitting: Cardiology

## 2023-04-15 ENCOUNTER — Encounter (HOSPITAL_COMMUNITY): Payer: Self-pay

## 2023-04-15 ENCOUNTER — Telehealth: Payer: Self-pay | Admitting: Nurse Practitioner

## 2023-04-15 VITALS — BP 128/60 | HR 60 | Wt 137.6 lb

## 2023-04-15 DIAGNOSIS — Z66 Do not resuscitate: Secondary | ICD-10-CM | POA: Insufficient documentation

## 2023-04-15 DIAGNOSIS — Z905 Acquired absence of kidney: Secondary | ICD-10-CM | POA: Diagnosis not present

## 2023-04-15 DIAGNOSIS — Z95810 Presence of automatic (implantable) cardiac defibrillator: Secondary | ICD-10-CM | POA: Diagnosis not present

## 2023-04-15 DIAGNOSIS — Z87891 Personal history of nicotine dependence: Secondary | ICD-10-CM | POA: Diagnosis not present

## 2023-04-15 DIAGNOSIS — I428 Other cardiomyopathies: Secondary | ICD-10-CM | POA: Diagnosis not present

## 2023-04-15 DIAGNOSIS — Z79899 Other long term (current) drug therapy: Secondary | ICD-10-CM | POA: Diagnosis not present

## 2023-04-15 DIAGNOSIS — J449 Chronic obstructive pulmonary disease, unspecified: Secondary | ICD-10-CM | POA: Insufficient documentation

## 2023-04-15 DIAGNOSIS — I4821 Permanent atrial fibrillation: Secondary | ICD-10-CM | POA: Diagnosis not present

## 2023-04-15 DIAGNOSIS — K5520 Angiodysplasia of colon without hemorrhage: Secondary | ICD-10-CM | POA: Insufficient documentation

## 2023-04-15 DIAGNOSIS — D500 Iron deficiency anemia secondary to blood loss (chronic): Secondary | ICD-10-CM | POA: Insufficient documentation

## 2023-04-15 DIAGNOSIS — Z85528 Personal history of other malignant neoplasm of kidney: Secondary | ICD-10-CM | POA: Insufficient documentation

## 2023-04-15 DIAGNOSIS — R0602 Shortness of breath: Secondary | ICD-10-CM | POA: Insufficient documentation

## 2023-04-15 DIAGNOSIS — I272 Pulmonary hypertension, unspecified: Secondary | ICD-10-CM

## 2023-04-15 DIAGNOSIS — I5032 Chronic diastolic (congestive) heart failure: Secondary | ICD-10-CM | POA: Diagnosis not present

## 2023-04-15 DIAGNOSIS — Z85828 Personal history of other malignant neoplasm of skin: Secondary | ICD-10-CM | POA: Insufficient documentation

## 2023-04-15 DIAGNOSIS — N184 Chronic kidney disease, stage 4 (severe): Secondary | ICD-10-CM | POA: Insufficient documentation

## 2023-04-15 DIAGNOSIS — I130 Hypertensive heart and chronic kidney disease with heart failure and stage 1 through stage 4 chronic kidney disease, or unspecified chronic kidney disease: Secondary | ICD-10-CM | POA: Insufficient documentation

## 2023-04-15 LAB — BASIC METABOLIC PANEL
Anion gap: 8 (ref 5–15)
BUN: 52 mg/dL — ABNORMAL HIGH (ref 8–23)
CO2: 28 mmol/L (ref 22–32)
Calcium: 8.8 mg/dL — ABNORMAL LOW (ref 8.9–10.3)
Chloride: 98 mmol/L (ref 98–111)
Creatinine, Ser: 1.83 mg/dL — ABNORMAL HIGH (ref 0.44–1.00)
GFR, Estimated: 26 mL/min — ABNORMAL LOW (ref >60)
Glucose, Bld: 107 mg/dL — ABNORMAL HIGH (ref 70–99)
Potassium: 3.9 mmol/L (ref 3.5–5.1)
Sodium: 134 mmol/L — ABNORMAL LOW (ref 135–145)

## 2023-04-15 LAB — BRAIN NATRIURETIC PEPTIDE: B Natriuretic Peptide: 359.6 pg/mL — ABNORMAL HIGH (ref 0.0–100.0)

## 2023-04-15 MED ORDER — METOLAZONE 2.5 MG PO TABS: 2.5000 mg | ORAL_TABLET | ORAL | 3 refills | Status: DC

## 2023-04-15 NOTE — Patient Instructions (Signed)
CHANGE Metolazone to every Tuesday and Saturday  Labs done today, your results will be available in MyChart, we will contact you for abnormal readings.  Your physician recommends that you schedule a follow-up appointment in: 4 weeks  If you have any questions or concerns before your next appointment please send [place] a message through [place] or call our office at [phone].    TO LEAVE A MESSAGE FOR THE NURSE SELECT OPTION 2, PLEASE LEAVE A MESSAGE INCLUDING: YOUR NAME DATE OF BIRTH CALL BACK NUMBER REASON FOR CALL**this is important as we prioritize the call backs  YOU WILL RECEIVE A CALL BACK THE SAME DAY AS LONG AS YOU CALL BEFORE 4:00 PM  At the [facility], you and your health needs are our priority. As part of our continuing mission to provide you with exceptional heart care, we have created designated Provider Care Teams. These Care Teams include your primary Cardiologist (physician) and Advanced Practice Providers (APPs- Physician Assistants and Nurse Practitioners) who all work together to provide you with the care you need, when you need it.   You may see any of the following providers on your designated Care Team at your next follow up: Dr Arvilla Meres Dr Marca Ancona Dr. Dorthula Nettles Dr. Clearnce Hasten Amy Filbert Schilder, NP Robbie Lis, [place] [facility] [place], [place] Brynda Peon, NP [place] Lee, NP Karle Plumber, PharmD   Please be sure to bring in all your medications bottles to every appointment.    Thank you for choosing [facility]

## 2023-04-15 NOTE — Progress Notes (Signed)
ADVANCED HF CLINIC NOTE   ID:  [name], DOB Feb 01, 1934, MRN [id number]   PCP:  [name], MD    Cardiologist:  Sherryl Manges, MD Primary HF: Dr. Shirlee Latch   History of Present Illness: 89 y.o. with history of permanent atrial fibrillation, chronic diastolic CHF, CKD stage 3, smoking/prior COPD, renal cell CA s/p right nephrectomy.  She has a prior history of HFrEF with nonischemic cardiomyopathy and had a Medtronic ICD placed.  However, subsequently her EF has recovered.  More recently, she has had symptomatic HFpEF.    6/20, she had left hip ORIF after mechanical fall.  Echo in 6/20 showed EF 60-65%, mild RV dilation with normal systolic function, PASP 93 mmHg.  After she got home from the hospital stay post-ORIF, she noted marked peripheral edema.  She was short of breath walking short distances.  +orthopnea.  She came back to the hospital on 11/04/18 and was admitted with volume overload.  She was diuresed aggressively and lost about 22 lbs.  RHC showed pulmonary venous hypertension.  PCWP tracing had prominent v-waves in absence of significant MR, suggesting significant diastolic dysfunction.   She has been noted to have significant Fe deficiency anemia, but FOBT was negative during 7/20 hospitalization.   Admitted 11/20 with symptomatic anemia, GI workup concerning for ischemic bowel.  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 Durwin Nora with VVS and conservative management recommended.  Capsule endoscopy showed no definite source of bleeding.  She was volume overloaded due to significant RV dysfunction and diuresed, she also developed AKI on CKD stage 3.      10/22, she had bilateral CIA stents placed. ABIs in 11/22 showed patent stents.  She had a skin cancer removed from her left leg. 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   Echo 2/23 EF 65-70% with mild LVH, D-shaped septum with moderate RV enlargement, and mildly  decreased RV function, PASP 92, moderate-severe TR, dilated IVC, moderate aortic stenosis.  Echo in 10/23 showed EF 60-65%, D-shaped septum, mild RV enlargement with normal RV systolic function, PASP 90, moderate-severe TR, mild AS, IVC dilated.   Patient was found to have a neuroendocrine tumor of the pancreatic head in the fall of 2023.  She has been treated with sandostatin monthly.  She also had further GI bleeding from small bowel AVMs in fall 2023.  She also had COVID-19 in 10/23 and has been on home oxygen 2L since that time.   Follow up 2/24, NYHA II-III chronic symptoms, volume overloaded. Torsemide increased to 80 mg daily, Farxiga added.   Follow up with Dr. Graciela Husbands, long discussion about EOL. Given DNR form and high-voltage therapies inactivated on ICD.  Diagnosed with shingles 09/02/22. Treated with Valtrex x1 week.   Admit 07/24 with acute on chronic blood loss anemia 2/2 GI bleed. Transfused and given IV iron. Had small bowel enteroscopy which showed 2 nonbleeding angiodysplastic lesions in duodenum and 3 nonbleeding angioplastic lesions in jejunum treated with APC.  Admitted early 9/24 with acute on chronic anemia suspected 2/2 chronic GI blood loss. Hgb down to 6.8. Received 2 u RBCs. GI did not recommend any endoscopic procedures d/t advanced age and comorbidities. Course further c/b acute on chronic CHF. She was diuresed with IV lasix and metolazone. Echo during admit with LVEF 65-70%, D-shaped septum, mildly reduced RV, RVSP 103 mmHg, biatrial enlargement, severe TR, moderate mitral stenosis with mean gradient of 6 mmHg, dilated IVC.  She was seen back in the [facility] on 01/30/23 for post hospital f/u and noted recurrent melena, SOB and fatigued w/ minimal exertion. Also noted to have recurrent volume overload w/ marked return of LEE. Hgb was down to 6.6. She was sent back to the ED and readmitted again for a/c CHF and GIB, treated w/ blood transfusions, IV Fe and diuretics. Discharged on  02/06/23 on torsemide 100 mg bid.   Today she returns for HF follow up with her niece. Overall feeling tired. She has noticed more swelling in her legs and is noted to have skin cancer to the L leg, which she is being seen for in January. Reports SOB with activity. Intermittent postual dizziness upon standing. Occasional pain in L side when moves her L arm, but no exertional CP.  Sleeps reclined on couch most nights. No fever or chills. Taking all medications.   Cardiomems on 04/15/23: 24, goal 17  (personally reviewed)  Labs (10/23): magnesium 1.7 Labs (3/24): TSH 2.93 Labs (4/24): K 3.9, creatinine 1.69, ALT 9, hgb 10, Plt 175 Labs (8/24): K 4.5, creatinine, 1.6 Labs (10/24): K 4.1, creatinine 1.55, Hgb 7.6 Labs (11/24): K 4.6, SCr 1.52 Labs (11/24): K 4.6, SCr 1.82 => 1.78, TSat 3, ferritin 16  PMH: 1. COPD 2. Renal cell carcinoma: S/p right kidney resection.  3. CKD stage 3 4. Fe deficiency anemia 5. Atrial fibrillation: Permanent. She has failed amiodarone, Tikosyn, and flecainide as well as multiple cardioversions. 6. Chronic diastolic CHF: She has history of prior HFrEF with nonischemic cardiomyopathy.  She has a Medtronic ICD.   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  - More recently, EF has been in normal range but she has had diastolic CHF.  - Echo (6/20): EF 60-65%, mildly dilated RV with PASP 93 mmHg, mild-moderate TR.  - RHC (7/20): mean RA 11, PA 64/18 mean 39, mean PCWP 24 with v waves to 41, CI 4.21, PVR 1.9 WU.  - Cardiomems placement - RHC (11/20): mean RA 13, PA 63/23 mean 25 with v waves to 47 (MR only mild by echo), mean PCWP 25, CI 3.18, PVR 2.6 WU - Echo (11/21): EF 65-70%, moderate LVH, D-shaped septum with mildly decreased RV systolic function, RVSP 85.9 mmHg, severe RAE, severe TR, mod-sev AS. - Echo (2/23): EF 65-70% with mild LVH, D-shaped septum with moderate RV enlargement, and mildly decreased RV function, PASP 92, moderate-severe TR, dilated IVC, moderate  aortic stenosis.  - Echo (10/23): EF 60-65%, D-shaped septum, mild RV enlargement with normal RV systolic function, PASP 90, moderate-severe TR, mild AS, IVC dilated.  - Echo (9/24): LVEF 65-70%, D-shaped septum, mildly reduced RV, RVSP 103 mmHg, biatrial enlargement, severe TR, moderate mitral stenosis with mean gradient of 6 mmHg, dilated IVC 7. Hyperlipidemia 8. Carotid stenosis: Carotid dopplers (3/19) with 40-59% LICA stenosis.   9. Possible ischemic bowel in 11/20 10. 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 Conservative management per Dr. Durwin Nora.  - 10/22 bilateral CIA stents.  - 11/22 ABIs: 1.07 left, 0.83 right with patent CIA stents.  11. 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  12. Squamous cell skin cancer.  13. Aortic stenosis: Mild on 10/23 echo.  14. 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  15. COVID 19 in 10/23 16. Mitral stenosis: Echo in 9/24 with moderate MS, mean gradient 6 mmHg with MVA 1.5 cm^2.  17. Tricuspid regurgitation: Severe TR.      Current  Outpatient Medications on File Prior to Encounter  Medication Sig Dispense Refill   acetaminophen (TYLENOL) 500 MG tablet Take 1,000 mg by mouth as needed.     Ascorbic Acid (VITAMIN C PO) Take 1 tablet by mouth daily.     Cholecalciferol (VITAMIN D-3 PO) Take 2 capsules by mouth daily.     Cyanocobalamin (B-12 PO) Take 1 tablet by mouth daily.     ferrous gluconate (FERGON) 324 MG tablet Take 1 tablet (324 mg total) by mouth daily with breakfast. 30 tablet 3   fluticasone (FLONASE) 50 MCG/ACT nasal spray Place 2 sprays into both nostrils daily as needed for allergies or rhinitis. 16 g 0   Multiple Vitamins-Minerals (HAIR SKIN NAILS PO) Take 1 capsule by mouth daily.     pantoprazole (PROTONIX) 40 MG tablet Take 1 tablet (40 mg total) by mouth daily. 30 tablet 3   potassium chloride SA (KLOR-CON M) 20 MEQ tablet Take 2 tablets (40 mEq  total) by mouth every morning AND 1 tablet (20 mEq total) at bedtime. Take extra 2 tabs when you take metolazone. 110 tablet 6   potassium chloride SA (KLOR-CON M) 20 MEQ tablet Take 1 tablet (20 mEq total) by mouth once a week. Weekly with Metolazone dose every Saturday 15 tablet 6   rosuvastatin (CRESTOR) 40 MG tablet Take 1 tablet (40 mg total) by mouth daily. 30 tablet 11   torsemide (DEMADEX) 20 MG tablet Take 5 tablets (100 mg total) by mouth 2 (two) times daily.     No current facility-administered medications on file prior to encounter.     Allergies:   Dilaudid [hydromorphone], Fentanyl, Codeine, Morphine and related, Amoxicillin, Hydrocodone, Oxycodone, Tramadol, Whole blood, and Sulfonamide derivatives    Social History:  The patient  reports that she quit smoking about 21 years ago. Her smoking use included cigarettes. She has a 20.00 pack-year smoking history. She has never used smokeless tobacco. She reports that she does not drink alcohol and does not use drugs.    Family History:  The patient's family history includes Breast cancer in her cousin and another family member; Breast cancer (age of onset: 4) in her maternal aunt; Early death in her father; Heart failure in her mother.    ROS:  Please see the history of present illness.   All other systems are personally reviewed and negative.    BP 128/60   Pulse 60   Wt 62.4 kg (137 lb 9.6 oz)   SpO2 100% Comment: 2 l n/c  BMI 22.21 kg/m   Wt Readings from Last 3 Encounters:  04/15/23 62.4 kg (137 lb 9.6 oz)  03/26/23 60.8 kg (134 lb 1.6 oz)  03/18/23 61.7 kg (136 lb)   PHYSICAL EXAM: General:  Frail, chronically ill appearing. On 2L [place]. Arrived in [facility]. HEENT: normal Neck: supple. JVP to midneck. Carotids 2+ bilat; no bruits. No lymphadenopathy or thryomegaly appreciated. Cor: PMI nondisplaced. Regular rate & rhythm. No rubs, gallops or murmurs. Lungs: clear Abdomen: soft, nontender, nondistended. No hepatosplenomegaly.  No bruits or masses. Good bowel sounds. Extremities: no cyanosis, clubbing, rash. +3 edema BLE. Red, ruddy scaly b/l. Lesion and weeping on LLE. Neuro: alert & orientedx3, cranial nerves grossly intact.  ASSESSMENT AND PLAN: 1. Chronic diastolic CHF: With prominent RV failure.  Echo in 6/20 showed EF 60-65%, mild RV dilation with normal systolic function, PASP 93 mmHg.  She had remote nonischemic cardiomyopathy with recovery of EF, has Medtronic ICD.  I suspect that there is  significant RV dysfunction. Atrial fibrillation likely contributes, but this appears to be permanent now as she has failed multiple anti-arrhythmics and is reasonably rate-controlled. RHC 11/20 showed elevated filling pressures and pulmonary venous hypertension.  Prominent v-waves on PCWP tracing likely due to stiff ventricle/diastolic dysfunction as she had only mild MR on echo.  Echo 10/23 with EF 60-65%, D-shaped septum, mild RV enlargement with normal RV systolic function, PASP 90, moderate-severe TR, mild AS, IVC dilated. Echo (9/24): LVEF 65-70%, D-shaped septum, mildly reduced RV, RVSP 103 mmHg, biatrial enlargement, severe TR, mild AS with mean gradient of 17 mmhg, moderate mitral stenosis with mean gradient of 6 mmHg, dilated IVC. She is RV pacing at a high percentage, but LV EF has remained normal.  - NYHA IIIb. Cardiomems elevated to 24 (goal 17). Volume up on exam. - Increase metolazone 2.5 mg to 2x/week + Kcl on Metolazone days - Continue torsemide 100 mg bid + 40/20 KCL - Off SGLT2i after UTIs - BMET reviewed 11/20: K 4.4, SCr 1.78. BMET today and repeat at [facility] center 2. Hypertension: BP controlled.  3. Pulmonary hypertension: PA systolic pressure estimated 103 mmHg on last echo in setting of volume overload. Based on RHC 11/20, she has primarily pulmonary venous hypertension, not candidate for pulmonary vasodilators.  4. CKD: Stage 4. Off SGLT2i - Labs 11/20 showed Scr 1.78 - Will repeat BMET/BNP today. 5.  Chronic anemia/GI bleeding: Due to small bowel AVMs.  - 2 recent admissions for GI bleeding - She is being followed by Heme/Onc for her anemia. Recieving Fe infusions 6. Atrial fibrillation: Permanent. Rate control is reasonable. She has failed amiodarone, Tikosyn, and flecainide as well as multiple cardioversions.  - Off Eliquis with recurrent bleeding 7. Aortic stenosis: Mild on last echo.  8. Neuroendocrine tumor of pancreatic head: Treating palliative with sandostatin.  9. Mitral stenosis: Moderate on 9/24 echo. Not candidate for repair.  10. Tricuspid regurgitation: Severe on 9/24 echo, functional due to RV failure/pulmonary hypertension.  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 Volume becoming more difficult to manage. Recurrent admissions. Dr. Graciela Husbands previously discussed EOL issues with her and her ICD high velocity therapies are de-activated.  - She has DNR.   Follow up in 3-4 weeks with APP due to high-risk for re-admit and close volume management.   [place] Lee, NP 04/15/23  Patient seen with NP, agree with the above note.   Weight up 1 lb today, PADP 24 by Cardiomems (goal 17).  She wears 2L home oxygen at all times and is in wheelchair for long distances.  Dyspnea walking more than about 20-30 feet.  More peripheral edema.  She has a skin cancer on the left leg, plan for excision in 1/25.    General: NAD, frail Neck: JVP 12-14 cm, no thyromegaly or thyroid nodule.  Lungs: Clear to auscultation bilaterally with normal respiratory effort. CV: Nondisplaced PMI.  Heart regular S1/S2, no S3/S4, 3/6 HSM LLSB.  2+ edema to knees.  Unable to palpate pedal pulses.  Abdomen: Soft, nontender, no hepatosplenomegaly, no distention.  Skin: venous stasis changes lower legs.  Neurologic: Alert and oriented x 3.  Psych: Normal affect. Extremities: No clubbing or cyanosis.  HEENT: Normal.   Patient has significant volume overload by exam and Cardiomems in setting of end stage RV failure  with severe TR.  Also with moderate mitral stenosis and presumed mixed pulmonary venous/pulmonary arterial hypertension. Unfortunately, I think that our only option for her at this point will be to  try to push her diuresis to keep her as comfortable as possible.  With age and comorbidities, she really does not have other options.  I spoke with her today about the severity of her RV failure.  I do think that a transition to a more palliative approach in the future would be a reasonable options.  - Continue torsemide 100 mg bid. - Increase metolazone to 2.5 mg twice a week.  - BMET/BNP today, BMET in 10 days.  - Followup in 1 month with APP to reassess volume.   Marca Ancona 04/15/2023 2:30 PM

## 2023-04-20 NOTE — Progress Notes (Deleted)
Patient Care Team: [name], MD as PCP - General (Family Medicine) Duke Salvia, MD as PCP - Cardiology (Cardiology) Laurey Morale, MD as PCP - Advanced Heart Failure (Cardiology) Antonieta Iba, MD as Consulting Physician (Cardiology) Duke Salvia, MD as Consulting Physician (Cardiology) [name], Latina Craver, MD as Consulting Physician (Vascular Surgery) Creig Hines, MD as Consulting Physician (Hematology and Oncology) Malachy Mood, MD as Consulting Physician (Oncology) Kathyrn Sheriff, [facility] (Inactive) as Pharmacist (Pharmacist)   CHIEF COMPLAINT: Follow up 1) pancreatic NET and 2) IDA 2/2 GIB  CURRENT THERAPY:  1)First line Sandostatin injection q28 days, starting 03/27/22  2)Blood transfusion and IV iron PRN, Bevacizumab q14 days, and ESA q14 days  INTERVAL HISTORY Ms. Armenti returns for follow up as scheduled. Last seen by Dr. Mosetta Putt 03/26/23 with C4 Beva. She is planning to undergo skin cancer excision so further Beva is on hold.   ROS   Past Medical History:  Diagnosis Date   (HFpEF) heart failure with preserved ejection fraction (HCC)    EF=60-60%   Actinic keratosis 01/17/2015   R forearm   Adjustment disorder with anxiety    Adverse effect of other narcotics, sequela    Intolerance to all narcotics   Anti-Duffy antibodies present    Aortic valve stenosis    Arthritis    "some in my hands" (11/11/2012)   Atrial fibrillation, permanent (HCC)    Eliquis   Atypical mole 03/25/2018   L forearm - severe   Automatic implantable cardioverter-defibrillator in situ    Avascular necrosis of hip (HCC) 05/03/2011   Carotid artery stenosis 09/2007   a. 09/2007: 60-79% bilateral (stable); b. 10/2008: 40-59% R 60-79%    Cellulitis of left lower extremity 07/13/2020   CKD (chronic kidney disease), stage III (HCC)    COPD (chronic obstructive pulmonary disease) (HCC)    Coronary artery disease    non-obstructive by 2006 cath   COVID-19 virus infection  02/12/2022   Displaced fracture of left femoral neck (HCC) 10/09/2018   GI bleed 03/28/2020   AVM   High cholesterol    Hypertension 05/20/2011   Hypertr obst cardiomyop    Hypotension, unspecified    cardiac cath 2006..nonobstructive CAD 30-40s lesions.Marland KitchenETT 1/09 nondiagnostic due to poor HR response..Right Renal Cancer 2003   Iron deficiency anemia    Long term (current) use of anticoagulants    On home oxygen therapy    3L [place]   Osteoarthritis of right hip    PAD (peripheral artery disease) (HCC)    Pancreatic cancer (HCC)    sandostatin   PONV (postoperative nausea and vomiting)    Presence of permanent cardiac pacemaker    Pulmonary HTN (HCC)    RECTAL BLEEDING 10/13/2009   Qualifier: Diagnosis of  By: Wilmon Pali NP, Paula     Red blood cell antibody positive with compatible PRBC difficult to obtain    Anti FYA (Duffy a) antibody. Must be transfused with PRB which are Duffy Antigen Negative and Crossmatch Compatible   Renal cell carcinoma (HCC)    s/p nephrectomy   Squamous cell carcinoma of skin 01/17/2015   R lat wrist   Squamous cell carcinoma of skin 01/29/2018   R post upper leg - superficially invasive   Squamous cell carcinoma of skin 03/25/2018   L lat foot   Squamous cell carcinoma of skin 11/09/2020   left lat foot - EDC 01/01/21, recurrent 01/31/21 - MOHs 02/22/21   Squamous cell carcinoma of skin  12/19/2021   Right Posterior Medial Thigh, EDC   Squamous cell carcinoma of skin 12/19/2021   SCC IS, L lat heel, EDC 02/04/2022   Squamous cell carcinoma of skin 01/21/2022   L forearm, EDC 02/04/2022   Squamous cell carcinoma of skin 01/21/2022   R lower leg below knee, EDC 02/04/2022   Squamous cell carcinoma of skin 01/21/2022   SCCIS, R post heel, EDC 02/04/2022   Squamous cell carcinoma of skin 07/01/2022   left lower abdomen, in situ, EDC   Squamous cell carcinoma of skin 07/01/2022   left medial chest, EDC   Urge incontinence    Venous stasis of both lower  extremities      Past Surgical History:  Procedure Laterality Date   ABDOMINAL AORTOGRAM W/LOWER EXTREMITY N/A 02/12/2021   Procedure: ABDOMINAL AORTOGRAM W/LOWER EXTREMITY;  Surgeon: Maeola Harman, MD;  Location: [facility] INVASIVE CV LAB;  Service: Cardiovascular;  Laterality: N/A;   ABDOMINAL HYSTERECTOMY  1975   for benign causes   APPENDECTOMY     BI-VENTRICULAR PACEMAKER UPGRADE  05/04/2010   BIOPSY  02/28/2019   Procedure: BIOPSY;  Surgeon: Rachael Fee, MD;  Location: [facility] ENDOSCOPY;  Service: Endoscopy;;   BIOPSY  03/04/2022   Procedure: BIOPSY;  Surgeon: Lemar Lofty., MD;  Location: [facility] ENDOSCOPY;  Service: Gastroenterology;;   CARDIAC CATHETERIZATION  2006   CARDIOVERSION N/A 02/20/2018   Procedure: CARDIOVERSION;  Surgeon: Antonieta Iba, MD;  Location: [facility] ORS;  Service: Cardiovascular;  Laterality: N/A;   CARDIOVERSION N/A 03/27/2018   Procedure: CARDIOVERSION;  Surgeon: Antonieta Iba, MD;  Location: [facility] ORS;  Service: Cardiovascular;  Laterality: N/A;   CATARACT EXTRACTION W/ INTRAOCULAR LENS  IMPLANT, BILATERAL  01/2006-02-2006   CHOLECYSTECTOMY N/A 11/11/2012   Procedure: LAPAROSCOPIC CHOLECYSTECTOMY WITH INTRAOPERATIVE CHOLANGIOGRAM;  Surgeon: Wilmon Arms. Corliss Skains, MD;  Location: [facility] OR;  Service: General;  Laterality: N/A;   COLONOSCOPY WITH PROPOFOL N/A 02/28/2019   Procedure: COLONOSCOPY WITH PROPOFOL;  Surgeon: Rachael Fee, MD;  Location: [facility] ENDOSCOPY;  Service: Endoscopy;  Laterality: N/A;   ENTEROSCOPY N/A 03/30/2020   Procedure: ENTEROSCOPY;  Surgeon: Shellia Cleverly, DO;  Location: [facility] ENDOSCOPY;  Service: Gastroenterology;  Laterality: N/A;   ENTEROSCOPY N/A 02/10/2021   Procedure: ENTEROSCOPY;  Surgeon: Jeani Hawking, MD;  Location: [facility] ENDOSCOPY;  Service: Endoscopy;  Laterality: N/A;   ENTEROSCOPY N/A 02/15/2022   Procedure: ENTEROSCOPY;  Surgeon: Imogene Burn, MD;  Location: [facility] ENDOSCOPY;  Service: Gastroenterology;  Laterality:  N/A;   ENTEROSCOPY N/A 11/06/2022   Procedure: ENTEROSCOPY;  Surgeon: Meridee Score Netty Starring., MD;  Location: [facility] ENDOSCOPY;  Service: Gastroenterology;  Laterality: N/A;   EP IMPLANTABLE DEVICE N/A 02/21/2016   Procedure: ICD Generator Changeout;  Surgeon: Duke Salvia, MD;  Location: [facility] INVASIVE CV LAB;  Service: Cardiovascular;  Laterality: N/A;   ESOPHAGOGASTRODUODENOSCOPY N/A 03/04/2022   Procedure: ESOPHAGOGASTRODUODENOSCOPY (EGD);  Surgeon: Lemar Lofty., MD;  Location: [facility] ENDOSCOPY;  Service: Gastroenterology;  Laterality: N/A;   ESOPHAGOGASTRODUODENOSCOPY (EGD) WITH PROPOFOL N/A 02/28/2019   Procedure: ESOPHAGOGASTRODUODENOSCOPY (EGD) WITH PROPOFOL;  Surgeon: Rachael Fee, MD;  Location: [facility] ENDOSCOPY;  Service: Endoscopy;  Laterality: N/A;   EUS N/A 03/04/2022   Procedure: UPPER ENDOSCOPIC ULTRASOUND (EUS) RADIAL;  Surgeon: Lemar Lofty., MD;  Location: [facility] ENDOSCOPY;  Service: Gastroenterology;  Laterality: N/A;   FINE NEEDLE ASPIRATION N/A 03/04/2022   Procedure: FINE NEEDLE ASPIRATION (FNA) LINEAR;  Surgeon: Lemar Lofty., MD;  Location: [facility] ENDOSCOPY;  Service: Gastroenterology;  Laterality: N/A;   GIVENS CAPSULE STUDY N/A 03/15/2019   Procedure: GIVENS CAPSULE STUDY;  Surgeon: Tressia Danas, MD;  Location: [facility] ENDOSCOPY;  Service: Gastroenterology;  Laterality: N/A;   HOT HEMOSTASIS N/A 03/30/2020   Procedure: HOT HEMOSTASIS (ARGON PLASMA COAGULATION/BICAP);  Surgeon: Shellia Cleverly, DO;  Location: [facility] ENDOSCOPY;  Service: Gastroenterology;  Laterality: N/A;   HOT HEMOSTASIS N/A 02/10/2021   Procedure: HOT HEMOSTASIS (ARGON PLASMA COAGULATION/BICAP);  Surgeon: Jeani Hawking, MD;  Location: [facility] ENDOSCOPY;  Service: Endoscopy;  Laterality: N/A;   HOT HEMOSTASIS N/A 02/15/2022   Procedure: HOT HEMOSTASIS (ARGON PLASMA COAGULATION/BICAP);  Surgeon: Imogene Burn, MD;  Location: [facility] ENDOSCOPY;  Service: Gastroenterology;  Laterality: N/A;   HOT  HEMOSTASIS N/A 11/06/2022   Procedure: HOT HEMOSTASIS (ARGON PLASMA COAGULATION/BICAP);  Surgeon: Lemar Lofty., MD;  Location: [facility] ENDOSCOPY;  Service: Gastroenterology;  Laterality: N/A;   INSERT / REPLACE / REMOVE PACEMAKER  05-01-11   02-28-05-/05-04-10-ICD-MEDTRONIC MAXIMAL DR   IR ANGIOGRAM SELECTIVE EACH ADDITIONAL VESSEL  02/18/2022   IR ANGIOGRAM SELECTIVE EACH ADDITIONAL VESSEL  02/18/2022   IR ANGIOGRAM VISCERAL SELECTIVE  02/16/2022   IR ANGIOGRAM VISCERAL SELECTIVE  02/16/2022   IR EMBO ART  VEN HEMORR LYMPH EXTRAV  INC GUIDE ROADMAPPING  02/16/2022   IR THORACENTESIS ASP PLEURAL SPACE W/IMG GUIDE  02/21/2022   IR THORACENTESIS ASP PLEURAL SPACE W/IMG GUIDE  02/22/2022   IR US GUIDE VASC ACCESS RIGHT  02/16/2022   JOINT REPLACEMENT     LAPAROSCOPIC CHOLECYSTECTOMY  11/11/2012   LAPAROSCOPIC LYSIS OF ADHESIONS N/A 11/11/2012   Procedure: LAPAROSCOPIC LYSIS OF ADHESIONS;  Surgeon: Wilmon Arms. Corliss Skains, MD;  Location: [facility] OR;  Service: General;  Laterality: N/A;   NEPHRECTOMY Right 06/2001    S/P RENAL CELL CANCER   PERIPHERAL VASCULAR INTERVENTION Bilateral 02/12/2021   Procedure: PERIPHERAL VASCULAR INTERVENTION;  Surgeon: Maeola Harman, MD;  Location: [facility] INVASIVE CV LAB;  Service: Cardiovascular;  Laterality: Bilateral;  Iliac artery stents   PRESSURE SENSOR/CARDIOMEMS N/A 02/03/2019   Procedure: PRESSURE SENSOR/CARDIOMEMS;  Surgeon: Laurey Morale, MD;  Location: [facility] INVASIVE CV LAB;  Service: Cardiovascular;  Laterality: N/A;   RIGHT HEART CATH N/A 11/09/2018   Procedure: RIGHT HEART CATH;  Surgeon: Laurey Morale, MD;  Location: [facility] INVASIVE CV LAB;  Service: Cardiovascular;  Laterality: N/A;   RIGHT HEART CATH N/A 03/08/2019   Procedure: RIGHT HEART CATH;  Surgeon: Laurey Morale, MD;  Location: [facility] INVASIVE CV LAB;  Service: Cardiovascular;  Laterality: N/A;   SUBMUCOSAL TATTOO INJECTION  02/28/2019   Procedure: SUBMUCOSAL TATTOO INJECTION;  Surgeon: Rachael Fee, MD;  Location: [facility] ENDOSCOPY;  Service: Endoscopy;;   SUBMUCOSAL TATTOO INJECTION  11/06/2022   Procedure: SUBMUCOSAL TATTOO INJECTION;  Surgeon: Lemar Lofty., MD;  Location: [facility] ENDOSCOPY;  Service: Gastroenterology;;   TOTAL HIP ARTHROPLASTY Right 05/03/2011   Procedure: TOTAL HIP ARTHROPLASTY ANTERIOR APPROACH;  Surgeon: Kathryne Hitch;  Location: [facility] ORS;  Service: Orthopedics;  Laterality: Right;  Removal of Cannulated Screws Right Hip, Right Direct Anterior Hip Replacement   TOTAL HIP ARTHROPLASTY Left 10/09/2018   Procedure: TOTAL HIP ARTHROPLASTY ANTERIOR APPROACH;  Surgeon: Samson Frederic, MD;  Location: [facility] OR;  Service: Orthopedics;  Laterality: Left;     Outpatient Encounter Medications as of 04/23/2023  Medication Sig   acetaminophen (TYLENOL) 500 MG tablet Take 1,000 mg by mouth as needed.   Ascorbic Acid (VITAMIN C PO) Take 1 tablet by mouth daily.   Cholecalciferol (VITAMIN D-3  PO) Take 2 capsules by mouth daily.   Cyanocobalamin (B-12 PO) Take 1 tablet by mouth daily.   ferrous gluconate (FERGON) 324 MG tablet Take 1 tablet (324 mg total) by mouth daily with breakfast.   fluticasone (FLONASE) 50 MCG/ACT nasal spray Place 2 sprays into both nostrils daily as needed for allergies or rhinitis.   metolazone (ZAROXOLYN) 2.5 MG tablet Take 1 tablet (2.5 mg total) by mouth 2 (two) times a week. Every Tuesday and  Saturday with 40 meq of potassium   Multiple Vitamins-Minerals (HAIR SKIN NAILS PO) Take 1 capsule by mouth daily.   pantoprazole (PROTONIX) 40 MG tablet Take 1 tablet (40 mg total) by mouth daily.   potassium chloride SA (KLOR-CON M) 20 MEQ tablet Take 2 tablets (40 mEq total) by mouth every morning AND 1 tablet (20 mEq total) at bedtime. Take extra 2 tabs when you take metolazone.   potassium chloride SA (KLOR-CON M) 20 MEQ tablet Take 1 tablet (20 mEq total) by mouth once a week. Weekly with Metolazone dose every Saturday   rosuvastatin (CRESTOR) 40  MG tablet Take 1 tablet (40 mg total) by mouth daily.   torsemide (DEMADEX) 20 MG tablet Take 5 tablets (100 mg total) by mouth 2 (two) times daily.   No facility-administered encounter medications on file as of 04/23/2023.     There were no vitals filed for this visit. There is no height or weight on file to calculate BMI.   PHYSICAL EXAM GENERAL:alert, no distress and comfortable SKIN: no rash  EYES: sclera clear NECK: without mass LYMPH:  no palpable cervical or supraclavicular lymphadenopathy  LUNGS: clear with normal breathing effort HEART: regular rate & rhythm, no lower extremity edema ABDOMEN: abdomen soft, non-tender and normal bowel sounds NEURO: alert & oriented x 3 with fluent speech, no focal motor/sensory deficits Breast exam:  PAC without erythema    CBC    Component Value Date/Time   WBC 4.2 04/09/2023 1335   WBC 3.2 (L) 02/05/2023 0302   RBC 3.56 (L) 04/09/2023 1335   HGB 8.7 (L) 04/09/2023 1335   HGB 8.1 (L) 03/22/2019 1415   HCT 30.6 (L) 04/09/2023 1335   HCT 25.9 (L) 03/22/2019 1415   PLT 190 04/09/2023 1335   PLT 226 03/22/2019 1415   MCV 86.0 04/09/2023 1335   MCV 88 03/22/2019 1415   MCH 24.4 (L) 04/09/2023 1335   MCHC 28.4 (L) 04/09/2023 1335   RDW 17.9 (H) 04/09/2023 1335   RDW 19.9 (H) 03/22/2019 1415   LYMPHSABS 0.4 (L) 04/09/2023 1335   LYMPHSABS 0.6 (L) 03/22/2019 1415   MONOABS 0.6 04/09/2023 1335   EOSABS 0.0 04/09/2023 1335   EOSABS 0.2 03/22/2019 1415   BASOSABS 0.0 04/09/2023 1335   BASOSABS 0.0 03/22/2019 1415     CMP     Component Value Date/Time   NA 134 (L) 04/15/2023 1252   NA 136 03/26/2023 1315   K 3.9 04/15/2023 1252   CL 98 04/15/2023 1252   CO2 28 04/15/2023 1252   GLUCOSE 107 (H) 04/15/2023 1252   BUN 52 (H) 04/15/2023 1252   BUN 56 (H) 03/26/2023 1315   CREATININE 1.83 (H) 04/15/2023 1252   CREATININE 1.82 (H) 03/18/2023 1331   CREATININE 1.34 (H) 02/12/2022 1655   CALCIUM 8.8 (L) 04/15/2023 1252   PROT 8.0  03/18/2023 1331   PROT 7.2 06/09/2014 1317   ALBUMIN 4.2 03/18/2023 1331   ALBUMIN 3.8 06/09/2014 1317   AST 14 (L) 03/18/2023 1331  ALT 6 03/18/2023 1331   ALT 23 06/09/2014 1317   ALKPHOS 53 03/18/2023 1331   ALKPHOS 71 06/09/2014 1317   BILITOT 0.6 03/18/2023 1331   GFRNONAA 26 (L) 04/15/2023 1252   GFRNONAA 27 (L) 03/18/2023 1331   GFRAA 39 (L) 12/23/2019 1458     ASSESSMENT & PLAN:89 year old female with   Neuroendocrine tumor of pancreatic head, 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   -Outpatient EUS 03/04/2022 by Dr. 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 Freida Busman did not recommend surgery in multifocal NET and also due to her age and co-morbidities. -She began first line sandostatin injections q28 days on 03/27/22.  -Dotatate PET scan, done 2 weeks after first injection 12/6, shows no significant tracer avidity in the pancrease or distant sites. Findings are likely due to close timing relative to the sandostatin injection blocking receptors. When we repeat this for restaging, will arrange 4 weeks after sandostatin    Multifactorial anemia, secondary to GI bleed, h/o IDA and B12 deficiency, and anemia of chronic disease -She has a longstanding history of anemia for at least 10 years, likely more; and history of GI bleeding from AVMs -She has received various formulations of IV iron in the past (ferlecit in 2020, Feraheme in 02/2022), B12 injections (last in 2021), and ESA (Retacrit, 2021) -During hospitalization 02/13/22 - 03/01/2022 she had  2 duodenal angioectasias that were treated with APC, 2 doses of Feraheme, and a total of 6 units of red cell transfusions (she had a transfusion reaction and required ICU placement and premeds for subsequent transfusions); melena resolved at discharge -hemoglobin stable in the 9 range; ferritin is high, hold additional iron for now -improved, Hgb 10-10.5 lately    Weakness, deconditioning, multifactorial   -Prior to her hospitalization in October she was functional at home, cooking, cleaning, and driving locally -after hospital d/c she was more sedentary, requires a walker, and is on 2-3 L of oxygen; she is profoundly deconditioned -She gets PT at home through [facility]   COVID-19, new oxygen requirement, pleural effusions, h/o COPD -She smoked for 30 years in the past, less than 1 pack/day.  She has COPD but no prior oxygen requirement.  -She underwent therapeutic thoracentesis in the hospital with 800-900 cc removed from each lung; cytology not done -Unclear how much of this is related to the neuroendocrine tumor, possibly recent COVID, or other -She recently developed sinus and R ear infection, on cefdinir per PCP. Improving -On 2 L O2, appreciates portable oxygen tank   Afib, CHF, carotid stenosis, HTN, ICD placement -She is on Eliquis for permanent Afib, cardiology prefers she stay on this for high risk. -We will monitor hgb closely given her bleeding risk on ac.  -recent echo 02/15/2022 showed LVEF 60-65% -leg edema improving on torsemide -She noticed blood on her pillow in the mornings recently, the sources is unclear but most likely epistaxis? I encouraged her to use humidifier in the room and moisten nares with vaseline with nasal cannula -Continue PCP and cardiology follow-up   H/o Renal cell carcinoma and CKD -S/p right nephrectomy  in 2003 -Baseline SCr 1.2-1.8 since 2019 -Stable   Social -Patient's 62 year old great grandson lives with her, he is a IT sales professional and assists  around the house -Has 2 children, her niece has been present for visits -She has strong family support   Goals of care -She met with palliative care team while in the hospital to establish relationship and goals of care -She understands that without surgery this is not curable disease, but still treatable; she is interested in treatment for now; goal is palliative   PLAN:  No orders of the defined types were placed in this encounter.     All questions were answered. The patient knows to call the clinic with any problems, questions or concerns. No barriers to learning were detected. I spent *** counseling the patient face to face. The total time spent in the appointment was *** and more than 50% was on counseling, review of test results, and coordination of care.   Santiago Glad, NP-C @DATE @

## 2023-04-22 ENCOUNTER — Telehealth: Payer: Self-pay | Admitting: Hematology

## 2023-04-23 ENCOUNTER — Telehealth: Payer: Self-pay

## 2023-04-23 ENCOUNTER — Inpatient Hospital Stay: Payer: PPO

## 2023-04-23 ENCOUNTER — Other Ambulatory Visit: Payer: Self-pay | Admitting: Hematology

## 2023-04-23 ENCOUNTER — Inpatient Hospital Stay: Payer: PPO | Admitting: Nurse Practitioner

## 2023-04-23 ENCOUNTER — Telehealth: Payer: Self-pay | Admitting: Hematology

## 2023-04-23 DIAGNOSIS — D3A8 Other benign neuroendocrine tumors: Secondary | ICD-10-CM | POA: Diagnosis not present

## 2023-04-23 DIAGNOSIS — D649 Anemia, unspecified: Secondary | ICD-10-CM

## 2023-04-23 DIAGNOSIS — D500 Iron deficiency anemia secondary to blood loss (chronic): Secondary | ICD-10-CM

## 2023-04-23 DIAGNOSIS — K5520 Angiodysplasia of colon without hemorrhage: Secondary | ICD-10-CM

## 2023-04-23 LAB — CBC WITH DIFFERENTIAL (CANCER CENTER ONLY)
Abs Immature Granulocytes: 0.01 10*3/uL (ref 0.00–0.07)
Basophils Absolute: 0 10*3/uL (ref 0.0–0.1)
Basophils Relative: 1 %
Eosinophils Absolute: 0 10*3/uL (ref 0.0–0.5)
Eosinophils Relative: 0 %
HCT: 23.8 % — ABNORMAL LOW (ref 36.0–46.0)
Hemoglobin: 6.8 g/dL — CL (ref 12.0–15.0)
Immature Granulocytes: 0 %
Lymphocytes Relative: 8 %
Lymphs Abs: 0.3 10*3/uL — ABNORMAL LOW (ref 0.7–4.0)
MCH: 23.6 pg — ABNORMAL LOW (ref 26.0–34.0)
MCHC: 28.6 g/dL — ABNORMAL LOW (ref 30.0–36.0)
MCV: 82.6 fL (ref 80.0–100.0)
Monocytes Absolute: 0.4 10*3/uL (ref 0.1–1.0)
Monocytes Relative: 10 %
Neutro Abs: 3.6 10*3/uL (ref 1.7–7.7)
Neutrophils Relative %: 81 %
Platelet Count: 194 10*3/uL (ref 150–400)
RBC: 2.88 MIL/uL — ABNORMAL LOW (ref 3.87–5.11)
RDW: 18.6 % — ABNORMAL HIGH (ref 11.5–15.5)
WBC Count: 4.4 10*3/uL (ref 4.0–10.5)
nRBC: 0 % (ref 0.0–0.2)

## 2023-04-23 LAB — SAMPLE TO BLOOD BANK

## 2023-04-23 NOTE — Telephone Encounter (Signed)
CRITICAL VALUE STICKER  CRITICAL VALUE: Hgb. 6.8  RECEIVER (on-site recipient of call): Sharlette Dense CMA  DATE & TIME NOTIFIED: 04/23/2023 1400  MESSENGER (representative from lab): Heather in Lab   MD NOTIFIED: Santiago Glad NP  TIME OF NOTIFICATION: 1400  RESPONSE:  Patient needs

## 2023-04-24 ENCOUNTER — Other Ambulatory Visit: Payer: Self-pay

## 2023-04-24 ENCOUNTER — Other Ambulatory Visit: Payer: PPO

## 2023-04-24 ENCOUNTER — Encounter: Payer: Self-pay | Admitting: Oncology

## 2023-04-24 ENCOUNTER — Telehealth: Payer: Self-pay

## 2023-04-24 ENCOUNTER — Encounter: Payer: Self-pay | Admitting: Hematology

## 2023-04-24 DIAGNOSIS — D500 Iron deficiency anemia secondary to blood loss (chronic): Secondary | ICD-10-CM

## 2023-04-24 DIAGNOSIS — D649 Anemia, unspecified: Secondary | ICD-10-CM

## 2023-04-24 DIAGNOSIS — K5520 Angiodysplasia of colon without hemorrhage: Secondary | ICD-10-CM

## 2023-04-24 LAB — PREPARE RBC (CROSSMATCH)

## 2023-04-24 NOTE — Telephone Encounter (Signed)
Spoke with pt to make pt aware that Dr. Mosetta Putt would like for the pt to restart the Bevacizumab infusions d/t pt's constant severe anemia.  Stated Erie Noe will contact the pt to get her scheduled hopefully the week of 05/05/2023 based on if Infusion have availability.  Pt verbalized understanding and stated she will await Vanessa's call.

## 2023-04-25 ENCOUNTER — Other Ambulatory Visit: Payer: Self-pay

## 2023-04-25 ENCOUNTER — Other Ambulatory Visit: Payer: PPO

## 2023-04-25 ENCOUNTER — Inpatient Hospital Stay (HOSPITAL_BASED_OUTPATIENT_CLINIC_OR_DEPARTMENT_OTHER): Payer: PPO | Admitting: Nurse Practitioner

## 2023-04-25 VITALS — BP 138/70 | HR 60 | Temp 97.3°F | Resp 15 | Ht 66.0 in

## 2023-04-25 DIAGNOSIS — D649 Anemia, unspecified: Secondary | ICD-10-CM

## 2023-04-25 DIAGNOSIS — D3A8 Other benign neuroendocrine tumors: Secondary | ICD-10-CM

## 2023-04-25 MED ORDER — DOXYCYCLINE HYCLATE 100 MG PO CAPS: 100.0000 mg | ORAL_CAPSULE | Freq: Two times a day (BID) | ORAL | 0 refills | Status: DC

## 2023-04-25 NOTE — Assessment & Plan Note (Signed)
 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   -Outpatient EUS 03/04/2022 by Dr. 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 Freida Busman did not recommend surgery in multifocal NET and also due to her age and co-morbidities. -She began first line sandostatin injections q28 days on 03/27/22.  -Dotatate PET scan, done 2 weeks after first injection 04/10/22, shows no significant tracer avidity in the pancrease or distant sites. Findings are likely due to close timing relative to the sandostatin injection blocking receptors. When we repeat this for restaging next year, will arrange 4 weeks after sandostatin -She has persistent anemia, likely secondary to GI bleeding from the tumor and AVM, she has had multiple hospital admission for anemia and bleeding.

## 2023-04-25 NOTE — Assessment & Plan Note (Signed)
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  Plan is regular Qweekly lab monitoring with transfusions as needed to keep Hgb >8.  No further GI intervention planned.

## 2023-04-25 NOTE — Progress Notes (Signed)
Patient Care Team: [name], MD as PCP - General (Family Medicine) Duke Salvia, MD as PCP - Cardiology (Cardiology) Laurey Morale, MD as PCP - Advanced Heart Failure (Cardiology) Antonieta Iba, MD as Consulting Physician (Cardiology) Duke Salvia, MD as Consulting Physician (Cardiology) [name], Latina Craver, MD as Consulting Physician (Vascular Surgery) Creig Hines, MD as Consulting Physician (Hematology and Oncology) Malachy Mood, MD as Consulting Physician (Oncology) Kathyrn Sheriff, [facility] (Inactive) as Pharmacist (Pharmacist)  Clinic Day:  05/04/2023  Referring physician: [name], MD  ASSESSMENT & PLAN:   Assessment & Plan: 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   -Outpatient EUS 03/04/2022 by Dr. 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 Freida Busman did not recommend surgery in multifocal NET and also due to her age and co-morbidities. -She began first line sandostatin injections q28 days on 03/27/22.  -Dotatate PET scan, done 2 weeks after first injection 04/10/22, shows no significant tracer avidity in the pancrease or distant sites. Findings are likely due to close timing relative to the sandostatin injection blocking receptors. When we repeat this for restaging next year, will arrange 4 weeks after sandostatin -She has persistent anemia, likely secondary to GI bleeding from the tumor and AVM, she has had multiple hospital admission for anemia and bleeding.     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  Plan is regular Qweekly lab monitoring with transfusions as needed to keep Hgb >8.  No further GI intervention planned.    Plan: Labs reviewed  -CBC showing WBC 4.4; Hgb 6.8; Hct 23.8; Plt 194; Anc 2.6 Patient is scheduled to get 2 units of PRBCs tomorrow. Will start doxycycline 100 mg twice daily for next 7 days due to weeping, tender, inflamed appearing skin lesions on lower extremities. Continue labs, follow-up, and treatments as currently scheduled.  The patient understands the plans discussed today and is in agreement with them.  She knows to contact our office if she develops concerns prior to her next appointment.  I provided 30 minutes of face-to-face time during this encounter and > 50% was spent counseling as documented under my assessment and plan.    [name] Jews, NP  [facility] - A DEPT OF [facility] [name] [name] [address] Dept: [phone] Dept Fax: [phone]   Orders Placed This Encounter  Procedures   Blood transfusion report - scanned      CHIEF COMPLAINT:  CC: symptomatic anemia and neuroendocrine tumor   Current Treatment:  bevacizumab and sandostatin   INTERVAL HISTORY:  [name] is here today for repeat clinical assessment.  She was last seen by Dr. Mosetta Putt on 03/26/2023.  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  She the last few weeks, patient has developed significant fatigue, dizziness, and balance problems.  Legs are very painful.  Patient states she is having difficulty walking because pain is so severe.  Lesions on her lower legs are getting worse over last few weeks.  Have certainly been blood-tinged fluid.  Fluid is malodorous.  Patient is having increased shortness of breath.    I have reviewed the past medical history, past surgical  history, social history and family history with the patient and they are unchanged from previous note.  ALLERGIES:  is allergic to codeine, amoxil [amoxicillin], asa [aspirin], ms contin [morphine], neurontin [gabapentin], and nsaids.  MEDICATIONS:  Current Outpatient Medications  Medication Sig Dispense Refill   doxycycline (VIBRAMYCIN) 100 MG capsule Take 1 capsule (100 mg total) by mouth 2 (two) times daily. 20 capsule 0   acetaminophen (TYLENOL) 500 MG tablet Take 1,000 mg by mouth as needed.     Ascorbic Acid (VITAMIN C PO) Take 1 tablet by mouth daily.     Cholecalciferol (VITAMIN D-3 PO) Take 2 capsules by mouth daily.     Cyanocobalamin (B-12 PO) Take 1 tablet by mouth daily.     ferrous gluconate (FERGON) 324 MG tablet Take 1 tablet (324 mg total) by mouth daily with breakfast. 30 tablet 3   fluticasone (FLONASE) 50 MCG/ACT nasal spray Place 2 sprays into both nostrils daily as needed for allergies or rhinitis. 16 g 0   metolazone (ZAROXOLYN) 2.5 MG tablet Take 1 tablet (2.5 mg total) by mouth 2 (two) times a week. Every Tuesday and  Saturday with 40 meq of potassium 90 tablet 3   Multiple Vitamins-Minerals (HAIR SKIN NAILS PO) Take 1 capsule by mouth daily.     pantoprazole (PROTONIX) 40 MG tablet Take 1 tablet (40 mg total) by mouth daily. 30 tablet 3   potassium chloride SA (KLOR-CON M) 20 MEQ tablet Take 2 tablets (40 mEq total) by mouth every morning AND 1 tablet (20 mEq total) at bedtime. Take extra 2 tabs when you take metolazone. 110 tablet 6   potassium chloride SA (KLOR-CON M) 20 MEQ tablet Take 1 tablet (20 mEq total) by mouth once a week. Weekly with Metolazone dose every Saturday 15 tablet 6   rosuvastatin (CRESTOR) 40 MG tablet Take 1 tablet (40 mg total) by mouth daily. 30 tablet 11   torsemide (DEMADEX) 20 MG tablet Take 5 tablets (100 mg total) by mouth 2 (two) times daily.     No current facility-administered medications for this visit.       REVIEW OF SYSTEMS:    Constitutional: Denies fevers, chills or abnormal weight loss.  Severe fatigue and increased weakness. Eyes: Denies blurriness of vision Ears, nose, mouth, throat, and face: Denies mucositis or sore throat Respiratory: Denies cough.  Having dyspnea on exertion. Cardiovascular: Denies palpitation, chest discomfort or lower extremity swelling Gastrointestinal:  Denies nausea, heartburn or change in bowel habits Skin: Worsening lesions on bilateral lower extremities.  Draining, painful.  Making it difficult for her to walk. Lymphatics: Denies new lymphadenopathy or easy bruising Neurological:Denies numbness, tingling or new weaknesses Behavioral/Psych: Mood is stable, no new changes  All other systems were reviewed with the patient and are negative.   VITALS:   Today's Vitals   04/25/23 1600 04/25/23 1608  BP: 97/70 138/70  Pulse: 71 60  Resp: 15   Temp: (!) 97.3 F (36.3 C)   TempSrc: Temporal   SpO2: 90% 100%  Height: 5\' 6"  (1.676 m)    Body mass index is 22.21 kg/m.   Wt Readings from Last 3 Encounters:  04/15/23 137 lb 9.6 oz (62.4 kg)  03/26/23 134 lb 1.6 oz (60.8 kg)  03/18/23 136 lb (61.7 kg)    Body mass index is 22.21 kg/m.  Performance status (ECOG): 2 - Symptomatic, <50% confined to bed  PHYSICAL EXAM:   GENERAL:alert, no distress and comfortable.  Ill-appearing SKIN: skin color, texture, turgor are normal, no rashes.  Significant lesions present on bilateral lower extremities.  Some are open.  No knee are draining serosanguineous fluid which is malodorous.  Both lower extremities very tender to palpate.  Mild swelling present. EYES: normal, Conjunctiva are pink and non-injected, sclera clear OROPHARYNX:no exudate, no erythema and lips, buccal mucosa, and tongue normal  NECK: supple, thyroid normal size, non-tender, without nodularity LYMPH:  no palpable lymphadenopathy in the cervical, axillary or inguinal LUNGS: clear to auscultation and percussion with  normal breathing effort.  Currently on nasal cannula oxygen at all times. HEART: regular rate & rhythm and no murmurs and no lower extremity edema ABDOMEN:abdomen soft, non-tender and normal bowel sounds Musculoskeletal:no cyanosis of digits and no clubbing  NEURO: alert & oriented x 3 with fluent speech, no focal motor/sensory deficits  LABORATORY DATA:  I have reviewed the data as listed    Component Value Date/Time   NA 134 (L) 04/15/2023 1252   NA 136 03/26/2023 1315   K 3.9 04/15/2023 1252   CL 98 04/15/2023 1252   CO2 28 04/15/2023 1252   GLUCOSE 107 (H) 04/15/2023 1252   BUN 52 (H) 04/15/2023 1252   BUN 56 (H) 03/26/2023 1315   CREATININE 1.83 (H) 04/15/2023 1252   CREATININE 1.82 (H) 03/18/2023 1331   CREATININE 1.34 (H) 02/12/2022 1655   CALCIUM 8.8 (L) 04/15/2023 1252   PROT 8.0 03/18/2023 1331   PROT 7.2 06/09/2014 1317   ALBUMIN 4.2 03/18/2023 1331   ALBUMIN 3.8 06/09/2014 1317   AST 14 (L) 03/18/2023 1331   ALT 6 03/18/2023 1331   ALT 23 06/09/2014 1317   ALKPHOS 53 03/18/2023 1331   ALKPHOS 71 06/09/2014 1317   BILITOT 0.6 03/18/2023 1331   GFRNONAA 26 (L) 04/15/2023 1252   GFRNONAA 27 (L) 03/18/2023 1331   GFRAA 39 (L) 12/23/2019 1458     Lab Results  Component Value Date   WBC 4.4 04/23/2023   NEUTROABS 3.6 04/23/2023   HGB 6.8 (LL) 04/23/2023   HCT 23.8 (L) 04/23/2023   MCV 82.6 04/23/2023   PLT 194 04/23/2023

## 2023-04-26 ENCOUNTER — Inpatient Hospital Stay: Payer: PPO

## 2023-04-26 DIAGNOSIS — D649 Anemia, unspecified: Secondary | ICD-10-CM

## 2023-04-26 DIAGNOSIS — K5520 Angiodysplasia of colon without hemorrhage: Secondary | ICD-10-CM

## 2023-04-26 DIAGNOSIS — D500 Iron deficiency anemia secondary to blood loss (chronic): Secondary | ICD-10-CM

## 2023-04-26 DIAGNOSIS — D3A8 Other benign neuroendocrine tumors: Secondary | ICD-10-CM | POA: Diagnosis not present

## 2023-04-26 MED ORDER — SODIUM CHLORIDE 0.9% IV SOLUTION
250.0000 mL | INTRAVENOUS | Status: DC
  Administered 2023-04-26: 100 mL via INTRAVENOUS

## 2023-04-26 MED ORDER — METHYLPREDNISOLONE SODIUM SUCC 125 MG IJ SOLR
125.0000 mg | Freq: Once | INTRAMUSCULAR | Status: AC
  Administered 2023-04-26: 125 mg via INTRAVENOUS
  Filled 2023-04-26: qty 2

## 2023-04-26 MED ORDER — FAMOTIDINE IN NACL 20-0.9 MG/50ML-% IV SOLN
20.0000 mg | Freq: Once | INTRAVENOUS | Status: AC
  Administered 2023-04-26: 20 mg via INTRAVENOUS
  Filled 2023-04-26: qty 50

## 2023-04-26 MED ORDER — DIPHENHYDRAMINE HCL 25 MG PO CAPS
50.0000 mg | ORAL_CAPSULE | Freq: Once | ORAL | Status: AC
  Administered 2023-04-26: 50 mg via ORAL
  Filled 2023-04-26: qty 2

## 2023-04-26 MED ORDER — ACETAMINOPHEN 325 MG PO TABS
650.0000 mg | ORAL_TABLET | Freq: Once | ORAL | Status: AC
  Administered 2023-04-26: 650 mg via ORAL
  Filled 2023-04-26: qty 2

## 2023-04-26 MED ORDER — FUROSEMIDE 10 MG/ML IJ SOLN
20.0000 mg | Freq: Once | INTRAMUSCULAR | Status: AC
  Administered 2023-04-26: 20 mg via INTRAVENOUS
  Filled 2023-04-26: qty 2

## 2023-04-26 MED ORDER — ONDANSETRON HCL 8 MG PO TABS
8.0000 mg | ORAL_TABLET | Freq: Once | ORAL | Status: AC
  Administered 2023-04-26: 8 mg via ORAL
  Filled 2023-04-26: qty 1

## 2023-04-26 NOTE — Patient Instructions (Signed)
Blood Transfusion, Adult, Care After After a blood transfusion, it is common to have: Bruising and soreness at the IV site. A headache. Follow these instructions at home: Your doctor may give you more instructions. If you have problems, contact your doctor. Insertion site care     Follow instructions from your doctor about how to take care of your insertion site. This is where an IV tube was put into your vein. Make sure you: Wash your hands with soap and water for at least 20 seconds before and after you change your bandage. If you cannot use soap and water, use hand sanitizer. Change your bandage as told by your doctor. Check your insertion site every day for signs of infection. Check for: Redness, swelling, or pain. Bleeding from the site. Warmth. Pus or a bad smell. General instructions Take over-the-counter and prescription medicines only as told by your doctor. Rest as told by your doctor. Go back to your normal activities as told by your doctor. Keep all follow-up visits. You may need to have tests at certain times to check your blood. Contact a doctor if: You have itching or red, swollen areas of skin (hives). You have a fever or chills. You have pain in the head, back, or chest. You feel worried or nervous (anxious). You feel weak after doing your normal activities. You have any of these problems at the insertion site: Redness, swelling, warmth, or pain. Bleeding that does not stop with pressure. Pus or a bad smell. If you received your blood transfusion in an outpatient setting, you will be told whom to contact to report any reactions. Get help right away if: You have signs of a serious reaction. This may be coming from an allergy or the body's defense system (immune system). Signs include: Trouble breathing or shortness of breath. Swelling of the face or feeling warm (flushed). A widespread rash. Dark pee (urine) or blood in the pee. Fast heartbeat. These symptoms  may be an emergency. Get help right away. Call 911. Do not wait to see if the symptoms will go away. Do not drive yourself to the hospital. Summary Bruising and soreness at the IV site are common. Check your insertion site every day for signs of infection. Rest as told by your doctor. Go back to your normal activities as told by your doctor. Get help right away if you have signs of a serious reaction. This information is not intended to replace advice given to you by your health care provider. Make sure you discuss any questions you have with your health care provider. Document Revised: 07/20/2021 Document Reviewed: 07/20/2021 Elsevier Patient Education  2024 [employer]  

## 2023-04-26 NOTE — Progress Notes (Signed)
Pt reported to infusion today for 2 units PRBCs. Pt had c/o nausea and dry heaving that onset this AM. Pt informed this RN that she took antibiotics with OJ on an empty stomach. This RN provided Pt crackers and ginger ale and nausea resolved. This RN educated Pt and Pt's family on the importance of eating prior to taking antibiotics to avoid stomach upset. Pt reported c/o nausea and dry heaving between the units of PRBCs. This RN reached out to on call provider, Dr. Leonides Schanz. This RN received V/O for Zofran 8 mg PO to be given. Prior to discharge Pt stated "I am feeling much better, thank you."

## 2023-04-27 LAB — TYPE AND SCREEN
ABO/RH(D): A NEG
Antibody Screen: POSITIVE
DAT, IgG: NEGATIVE
Unit division: 0
Unit division: 0

## 2023-04-27 LAB — BPAM RBC
Blood Product Expiration Date: 202501152359
Blood Product Unit Number: 202501032359
ISSUE DATE / TIME: 202412210836
PRODUCT CODE: 202412210836
PRODUCT CODE: 202501152359
Unit Type and Rh: 600
Unit Type and Rh: 202501032359
Unit Type and Rh: 600
Unit Type and Rh: 600

## 2023-04-28 DIAGNOSIS — I5033 Acute on chronic diastolic (congestive) heart failure: Secondary | ICD-10-CM | POA: Diagnosis not present

## 2023-04-28 DIAGNOSIS — J449 Chronic obstructive pulmonary disease, unspecified: Secondary | ICD-10-CM | POA: Diagnosis not present

## 2023-05-04 ENCOUNTER — Encounter: Payer: Self-pay | Admitting: Nurse Practitioner

## 2023-05-04 ENCOUNTER — Encounter: Payer: Self-pay | Admitting: Hematology

## 2023-05-04 ENCOUNTER — Encounter: Payer: Self-pay | Admitting: Oncology

## 2023-05-05 ENCOUNTER — Telehealth: Payer: Self-pay | Admitting: *Deleted

## 2023-05-05 NOTE — Telephone Encounter (Signed)
Copied from CRM [id number]. Topic: Clinical - Medical Advice >> May 05, 2023  1:59 PM Kathryne Eriksson wrote: Reason for CRM: Medical Advise >> May 05, 2023  2:01 PM Kathryne Eriksson wrote: Marylene Land from [facility] called, states the patient is refusing to use her device but also complaining of being short of breath. Marylene Land is looking to seek some sort of assistance with maybe talking Remii into using it. Marylene Land call back number is [phone]

## 2023-05-06 NOTE — Telephone Encounter (Signed)
 Spoke with Jon, of [facility], to get more details. States Dr KANDICE signed orders for non-invasive ventilation device, however, pt is refusing to use it. States pt recently seen by cards and told part of her heart is working well. So pt has decided it's now not worth the effort to use device. She's hoping Dr KANDICE can convince pt to use it. Plz advise.

## 2023-05-08 ENCOUNTER — Telehealth: Payer: Self-pay | Admitting: Hematology

## 2023-05-12 ENCOUNTER — Inpatient Hospital Stay: Payer: PPO | Attending: Nurse Practitioner

## 2023-05-12 ENCOUNTER — Ambulatory Visit: Payer: PPO | Admitting: Cardiology

## 2023-05-12 ENCOUNTER — Other Ambulatory Visit: Payer: Self-pay

## 2023-05-12 DIAGNOSIS — N1832 Chronic kidney disease, stage 3b: Secondary | ICD-10-CM | POA: Insufficient documentation

## 2023-05-12 DIAGNOSIS — D631 Anemia in chronic kidney disease: Secondary | ICD-10-CM | POA: Insufficient documentation

## 2023-05-12 DIAGNOSIS — D3A8 Other benign neuroendocrine tumors: Secondary | ICD-10-CM | POA: Diagnosis not present

## 2023-05-12 DIAGNOSIS — D649 Anemia, unspecified: Secondary | ICD-10-CM

## 2023-05-12 DIAGNOSIS — K5520 Angiodysplasia of colon without hemorrhage: Secondary | ICD-10-CM

## 2023-05-12 DIAGNOSIS — I872 Venous insufficiency (chronic) (peripheral): Secondary | ICD-10-CM | POA: Diagnosis not present

## 2023-05-12 LAB — CBC WITH DIFFERENTIAL (CANCER CENTER ONLY)
Abs Immature Granulocytes: 0.02 10*3/uL (ref 0.00–0.07)
Basophils Absolute: 0 10*3/uL (ref 0.0–0.1)
Basophils Relative: 1 %
Eosinophils Absolute: 0 10*3/uL (ref 0.0–0.5)
Eosinophils Relative: 0 %
HCT: 28 % — ABNORMAL LOW (ref 36.0–46.0)
Hemoglobin: 8.1 g/dL — ABNORMAL LOW (ref 12.0–15.0)
Immature Granulocytes: 0 %
Lymphocytes Relative: 7 %
Lymphs Abs: 0.4 10*3/uL — ABNORMAL LOW (ref 0.7–4.0)
MCH: 24.8 pg — ABNORMAL LOW (ref 26.0–34.0)
MCHC: 28.9 g/dL — ABNORMAL LOW (ref 30.0–36.0)
MCV: 85.6 fL (ref 80.0–100.0)
Monocytes Absolute: 0.6 10*3/uL (ref 0.1–1.0)
Monocytes Relative: 10 %
Neutro Abs: 4.4 10*3/uL (ref 1.7–7.7)
Neutrophils Relative %: 82 %
Platelet Count: 235 10*3/uL (ref 150–400)
RBC: 3.27 MIL/uL — ABNORMAL LOW (ref 3.87–5.11)
RDW: 20.8 % — ABNORMAL HIGH (ref 11.5–15.5)
WBC Count: 5.4 10*3/uL (ref 4.0–10.5)
nRBC: 0 % (ref 0.0–0.2)

## 2023-05-12 LAB — SAMPLE TO BLOOD BANK

## 2023-05-14 ENCOUNTER — Inpatient Hospital Stay: Payer: PPO | Admitting: Nurse Practitioner

## 2023-05-14 ENCOUNTER — Other Ambulatory Visit: Payer: PPO

## 2023-05-14 ENCOUNTER — Encounter: Payer: Self-pay | Admitting: Nurse Practitioner

## 2023-05-14 ENCOUNTER — Inpatient Hospital Stay: Payer: PPO

## 2023-05-14 ENCOUNTER — Other Ambulatory Visit: Payer: Self-pay | Admitting: Nurse Practitioner

## 2023-05-14 VITALS — BP 145/66 | HR 59 | Resp 18

## 2023-05-14 VITALS — BP 117/54 | HR 60 | Temp 97.6°F | Ht 66.0 in | Wt 141.4 lb

## 2023-05-14 DIAGNOSIS — D3A8 Other benign neuroendocrine tumors: Secondary | ICD-10-CM

## 2023-05-14 DIAGNOSIS — D649 Anemia, unspecified: Secondary | ICD-10-CM

## 2023-05-14 DIAGNOSIS — D500 Iron deficiency anemia secondary to blood loss (chronic): Secondary | ICD-10-CM | POA: Diagnosis not present

## 2023-05-14 DIAGNOSIS — K5520 Angiodysplasia of colon without hemorrhage: Secondary | ICD-10-CM

## 2023-05-14 LAB — CBC WITH DIFFERENTIAL (CANCER CENTER ONLY)
Abs Immature Granulocytes: 0.03 10*3/uL (ref 0.00–0.07)
Basophils Absolute: 0 10*3/uL (ref 0.0–0.1)
Basophils Relative: 0 %
Eosinophils Absolute: 0 10*3/uL (ref 0.0–0.5)
Eosinophils Relative: 0 %
HCT: 26.6 % — ABNORMAL LOW (ref 36.0–46.0)
Hemoglobin: 7.9 g/dL — ABNORMAL LOW (ref 12.0–15.0)
Immature Granulocytes: 1 %
Lymphocytes Relative: 7 %
Lymphs Abs: 0.4 10*3/uL — ABNORMAL LOW (ref 0.7–4.0)
MCH: 25.5 pg — ABNORMAL LOW (ref 26.0–34.0)
MCHC: 29.7 g/dL — ABNORMAL LOW (ref 30.0–36.0)
MCV: 85.8 fL (ref 80.0–100.0)
Monocytes Absolute: 0.5 10*3/uL (ref 0.1–1.0)
Monocytes Relative: 11 %
Neutro Abs: 4.1 10*3/uL (ref 1.7–7.7)
Neutrophils Relative %: 81 %
Platelet Count: 233 10*3/uL (ref 150–400)
RBC: 3.1 MIL/uL — ABNORMAL LOW (ref 3.87–5.11)
RDW: 20.8 % — ABNORMAL HIGH (ref 11.5–15.5)
WBC Count: 5 10*3/uL (ref 4.0–10.5)
nRBC: 0 % (ref 0.0–0.2)

## 2023-05-14 LAB — CMP (CANCER CENTER ONLY)
ALT: 7 U/L (ref 0–44)
AST: 14 U/L — ABNORMAL LOW (ref 15–41)
Albumin: 3.8 g/dL (ref 3.5–5.0)
Alkaline Phosphatase: 61 U/L (ref 38–126)
Anion gap: 6 (ref 5–15)
BUN: 51 mg/dL — ABNORMAL HIGH (ref 8–23)
CO2: 31 mmol/L (ref 22–32)
Calcium: 9.1 mg/dL (ref 8.9–10.3)
Chloride: 100 mmol/L (ref 98–111)
Creatinine: 1.66 mg/dL — ABNORMAL HIGH (ref 0.44–1.00)
GFR, Estimated: 30 mL/min — ABNORMAL LOW (ref >60)
Glucose, Bld: 158 mg/dL — ABNORMAL HIGH (ref 70–99)
Potassium: 4.5 mmol/L (ref 3.5–5.1)
Sodium: 137 mmol/L (ref 135–145)
Total Bilirubin: 0.6 mg/dL (ref 0.0–1.2)
Total Protein: 7.6 g/dL (ref 6.5–8.1)

## 2023-05-14 LAB — SAMPLE TO BLOOD BANK

## 2023-05-14 LAB — TOTAL PROTEIN, URINE DIPSTICK: Protein, ur: NEGATIVE mg/dL

## 2023-05-14 MED ORDER — BEVACIZUMAB-ADCD CHEMO INJECTION 400 MG/16ML
5.0000 mg/kg | Freq: Once | INTRAVENOUS | Status: AC
  Administered 2023-05-14: 300 mg via INTRAVENOUS
  Filled 2023-05-14: qty 12

## 2023-05-14 MED ORDER — SODIUM CHLORIDE 0.9 % IV SOLN: Freq: Once | INTRAVENOUS | Status: AC

## 2023-05-14 MED ORDER — OCTREOTIDE ACETATE 20 MG IM KIT
20.0000 mg | PACK | Freq: Once | INTRAMUSCULAR | Status: AC
  Administered 2023-05-14: 20 mg via INTRAMUSCULAR
  Filled 2023-05-14: qty 1

## 2023-05-14 MED ORDER — ACETAMINOPHEN 500 MG PO TABS
1000.0000 mg | ORAL_TABLET | Freq: Once | ORAL | Status: AC
  Administered 2023-05-14: 1000 mg via ORAL
  Filled 2023-05-14: qty 2

## 2023-05-14 MED ORDER — EPOETIN ALFA-EPBX 20000 UNIT/ML IJ SOLN
20000.0000 [IU] | Freq: Once | INTRAMUSCULAR | Status: AC
  Administered 2023-05-14: 20000 [IU] via SUBCUTANEOUS
  Filled 2023-05-14: qty 1

## 2023-05-14 NOTE — Patient Instructions (Signed)
 [facility]  Discharge Instructions: Thank you for choosing [facility] to provide your oncology and hematology care.   If you have a lab appointment with the Cancer Center, please go directly to the Cancer Center and check in at the registration area.   Wear comfortable clothing and clothing appropriate for easy access to any Portacath or PICC line.   We strive to give you quality time with your provider. You may need to reschedule your appointment if you arrive late (15 or more minutes).  Arriving late affects you and other patients whose appointments are after yours.  Also, if you miss three or more appointments without notifying the office, you may be dismissed from the clinic at the provider's discretion.      For prescription refill requests, have your pharmacy contact our office and allow 72 hours for refills to be completed.    Today you received the following chemotherapy and/or immunotherapy agents: Bevacizumab      To help prevent nausea and vomiting after your treatment, we encourage you to take your nausea medication as directed.  BELOW ARE SYMPTOMS THAT SHOULD BE REPORTED IMMEDIATELY: *FEVER GREATER THAN 100.4 F (38 C) OR HIGHER *CHILLS OR SWEATING *NAUSEA AND VOMITING THAT IS NOT CONTROLLED WITH YOUR NAUSEA MEDICATION *UNUSUAL SHORTNESS OF BREATH *UNUSUAL BRUISING OR BLEEDING *URINARY PROBLEMS (pain or burning when urinating, or frequent urination) *BOWEL PROBLEMS (unusual diarrhea, constipation, pain near the anus) TENDERNESS IN MOUTH AND THROAT WITH OR WITHOUT PRESENCE OF ULCERS (sore throat, sores in mouth, or a toothache) UNUSUAL RASH, SWELLING OR PAIN  UNUSUAL VAGINAL DISCHARGE OR ITCHING   Items with * indicate a potential emergency and should be followed up as soon as possible or go to the Emergency Department if any problems should occur.  Please show the CHEMOTHERAPY ALERT CARD or  IMMUNOTHERAPY ALERT CARD at check-in to the Emergency Department and triage nurse.  Should you have questions after your visit or need to cancel or reschedule your appointment, please contact [facility]  Dept: [phone]  and follow the prompts.  Office hours are 8:00 a.m. to 4:30 p.m. Monday - Friday. Please note that voicemails left after 4:00 p.m. may not be returned until the following business day.  We are closed weekends and major holidays. You have access to a nurse at all times for urgent questions. Please call the main number to the clinic Dept: [phone] and follow the prompts.   For any non-urgent questions, you may also contact your provider using MyChart. We now offer e-Visits for anyone 60 and older to request care online for non-urgent symptoms. For details visit [email or web address].   Also download the MyChart app! Go to the app store, search "MyChart", open the app, select [facility], and log in with your MyChart username and password.  Bevacizumab Injection What is this medication? BEVACIZUMAB (be va SIZ yoo mab) treats some types of cancer. It works by blocking a protein that causes cancer cells to grow and multiply. This helps to slow or stop the spread of cancer cells. It is a monoclonal antibody. This medicine may be used for other purposes; ask your health care provider or pharmacist if you have questions. COMMON BRAND NAME(S): Alymsys, Avastin, MVASI, Omer Jack What should I tell my care team before I take this medication? They need to know if you have  any of these conditions: Blood clots Coughing up blood Having or recent surgery Heart failure High blood pressure History of a connection between 2 or more body parts that do not usually connect (fistula) History of a tear in your stomach or intestines Protein in your urine An unusual or allergic reaction to bevacizumab, other medications, foods, dyes, or  preservatives Pregnant or trying to get pregnant Breast-feeding How should I use this medication? This medication is injected into a vein. It is given by your care team in a hospital or clinic setting. Talk to your care team the use of this medication in children. Special care may be needed. Overdosage: If you think you have taken too much of this medicine contact a poison control center or emergency room at once. NOTE: This medicine is only for you. Do not share this medicine with others. What if I miss a dose? Keep appointments for follow-up doses. It is important not to miss your dose. Call your care team if you are unable to keep an appointment. What may interact with this medication? Interactions are not expected. This list may not describe all possible interactions. Give your health care provider a list of all the medicines, herbs, non-prescription drugs, or dietary supplements you use. Also tell them if you smoke, drink alcohol, or use illegal drugs. Some items may interact with your medicine. What should I watch for while using this medication? Your condition will be monitored carefully while you are receiving this medication. You may need blood work while taking this medication. This medication may make you feel generally unwell. This is not uncommon as chemotherapy can affect healthy cells as well as cancer cells. Report any side effects. Continue your course of treatment even though you feel ill unless your care team tells you to stop. This medication may increase your risk to bruise or bleed. Call your care team if you notice any unusual bleeding. Before having surgery, talk to your care team to make sure it is ok. This medication can increase the risk of poor healing of your surgical site or wound. You will need to stop this medication for 28 days before surgery. After surgery, wait at least 28 days before restarting this medication. Make sure the surgical site or wound is healed enough  before restarting this medication. Talk to your care team if questions. Talk to your care team if you may be pregnant. Serious birth defects can occur if you take this medication during pregnancy and for 6 months after the last dose. Contraception is recommended while taking this medication and for 6 months after the last dose. Your care team can help you find the option that works for you. Do not breastfeed while taking this medication and for 6 months after the last dose. This medication can cause infertility. Talk to your care team if you are concerned about your fertility. What side effects may I notice from receiving this medication? Side effects that you should report to your care team as soon as possible: Allergic reactions--skin rash, itching, hives, swelling of the face, lips, tongue, or throat Bleeding--bloody or black, tar-like stools, vomiting blood or brown material that looks like coffee grounds, red or dark brown urine, small red or purple spots on skin, unusual bruising or bleeding Blood clot--pain, swelling, or warmth in the leg, shortness of breath, chest pain Heart attack--pain or tightness in the chest, shoulders, arms, or jaw, nausea, shortness of breath, cold or clammy skin, feeling faint or lightheaded Heart failure--shortness of  breath, swelling of the ankles, feet, or hands, sudden weight gain, unusual weakness or fatigue Increase in blood pressure Infection--fever, chills, cough, sore throat, wounds that don't heal, pain or trouble when passing urine, general feeling of discomfort or being unwell Infusion reactions--chest pain, shortness of breath or trouble breathing, feeling faint or lightheaded Kidney injury--decrease in the amount of urine, swelling of the ankles, hands, or feet Stomach pain that is severe, does not go away, or gets worse Stroke--sudden numbness or weakness of the face, arm, or leg, trouble speaking, confusion, trouble walking, loss of balance or  coordination, dizziness, severe headache, change in vision Sudden and severe headache, confusion, change in vision, seizures, which may be signs of posterior reversible encephalopathy syndrome (PRES) Side effects that usually do not require medical attention (report to your care team if they continue or are bothersome): Back pain Change in taste Diarrhea Dry skin Increased tears Nosebleed This list may not describe all possible side effects. Call your doctor for medical advice about side effects. You may report side effects to FDA at 1-800-FDA-1088. Where should I keep my medication? This medication is given in a hospital or clinic. It will not be stored at home. NOTE: This sheet is a summary. It may not cover all possible information. If you have questions about this medicine, talk to your doctor, pharmacist, or health care provider.  2024 Elsevier/Gold Standard (2021-09-07 00:00:00)

## 2023-05-14 NOTE — Progress Notes (Signed)
 Patient Care Team: [name], MD as PCP - General (Family Medicine) [name], MD as PCP - Cardiology (Cardiology) [name], MD as PCP - Advanced Heart Failure (Cardiology) [name], MD as Consulting Physician (Cardiology) [name], MD as Consulting Physician (Cardiology) Jama, [name], MD as Consulting Physician (Vascular Surgery) [name], MD as Consulting Physician (Hematology and Oncology) [name], MD as Consulting Physician (Oncology) [name], [facility] (Inactive) as Pharmacist (Pharmacist)   CHIEF COMPLAINT: Follow up pancreatic NET and anemia   CURRENT THERAPY:  Pancreatic NET: Sandostatin  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 Here to resume Beva for acute on chronic anemia. Last Esa 12/4. Also here for sandostatin  for PNET. She has weeping painful legs and foot pain L>R, seeing wound care and has biopsy on Monday. Has occasional dark stool but no obvious bleeding. DOE is stable at baseline. She notes increased abdominal firmness and pain in the past month. Denies diarrhea but expects some after sandostatin  today.  ROS  All other systems reviewed and negative  Past Medical History:  Diagnosis Date   (HFpEF) heart failure with preserved ejection fraction (HCC)    EF=60-60%   Actinic keratosis 01/17/2015   R forearm   Adjustment disorder with anxiety    Adverse effect of other narcotics, sequela    Intolerance to all narcotics   Anti-Duffy antibodies present    Aortic valve stenosis    Arthritis    some in my hands (11/11/2012)   Atrial fibrillation, permanent (HCC)    Eliquis    Atypical mole 03/25/2018   L forearm - severe   Automatic implantable cardioverter-defibrillator in situ    Avascular necrosis of hip (HCC) 05/03/2011   Carotid artery stenosis 09/2007   a. 09/2007: 60-79% bilateral (stable); b. 10/2008:  40-59% R 60-79%    Cellulitis of left lower extremity 07/13/2020   CKD (chronic kidney disease), stage III (HCC)    COPD (chronic obstructive pulmonary disease) (HCC)    Coronary artery disease    non-obstructive by 2006 cath   COVID-19 virus infection 02/12/2022   Displaced fracture of left femoral neck (HCC) 10/09/2018   GI bleed 03/28/2020   AVM   High cholesterol    Hypertension 05/20/2011   Hypertr obst cardiomyop    Hypotension, unspecified    cardiac cath 2006..nonobstructive CAD 30-40s lesions.SABRAETT 1/09 nondiagnostic due to poor HR response..Right Renal Cancer 2003   Iron  deficiency anemia    Long term (current) use of anticoagulants    On home oxygen  therapy    3L [place]   Osteoarthritis of right hip    PAD (peripheral artery disease) (HCC)    Pancreatic cancer (HCC)    sandostatin    PONV (postoperative nausea and vomiting)    Presence of permanent cardiac pacemaker    Pulmonary HTN (HCC)    RECTAL BLEEDING 10/13/2009   Qualifier: Diagnosis of  By: Kerman NP, Paula     Red blood cell antibody positive with compatible PRBC difficult to obtain    Anti FYA (Duffy a) antibody. Must be transfused with PRB which are Duffy Antigen Negative and Crossmatch Compatible   Renal cell carcinoma (HCC)    s/p nephrectomy   Squamous cell carcinoma of skin 01/17/2015   R lat wrist   Squamous cell carcinoma of skin 01/29/2018   R post upper leg - superficially invasive  Squamous cell carcinoma of skin 03/25/2018   L lat foot   Squamous cell carcinoma of skin 11/09/2020   left lat foot - EDC 01/01/21, recurrent 01/31/21 - MOHs 02/22/21   Squamous cell carcinoma of skin 12/19/2021   Right Posterior Medial Thigh, EDC   Squamous cell carcinoma of skin 12/19/2021   SCC IS, L lat heel, EDC 02/04/2022   Squamous cell carcinoma of skin 01/21/2022   L forearm, EDC 02/04/2022   Squamous cell carcinoma of skin 01/21/2022   R lower leg below knee, EDC 02/04/2022   Squamous cell carcinoma of  skin 01/21/2022   SCCIS, R post heel, EDC 02/04/2022   Squamous cell carcinoma of skin 07/01/2022   left lower abdomen, in situ, EDC   Squamous cell carcinoma of skin 07/01/2022   left medial chest, EDC   Urge incontinence    Venous stasis of both lower extremities      Past Surgical History:  Procedure Laterality Date   ABDOMINAL AORTOGRAM W/LOWER EXTREMITY N/A 02/12/2021   Procedure: ABDOMINAL AORTOGRAM W/LOWER EXTREMITY;  Surgeon: Sheree Penne Bruckner, MD;  Location: [facility] INVASIVE CV LAB;  Service: Cardiovascular;  Laterality: N/A;   ABDOMINAL HYSTERECTOMY  1975   for benign causes   APPENDECTOMY     BI-VENTRICULAR PACEMAKER UPGRADE  05/04/2010   BIOPSY  02/28/2019   Procedure: BIOPSY;  Surgeon: Teressa Toribio SQUIBB, MD;  Location: [facility] ENDOSCOPY;  Service: Endoscopy;;   BIOPSY  03/04/2022   Procedure: BIOPSY;  Surgeon: Wilhelmenia Aloha Raddle., MD;  Location: [facility] ENDOSCOPY;  Service: Gastroenterology;;   CARDIAC CATHETERIZATION  2006   CARDIOVERSION N/A 02/20/2018   Procedure: CARDIOVERSION;  Surgeon: [name], MD;  Location: [facility] ORS;  Service: Cardiovascular;  Laterality: N/A;   CARDIOVERSION N/A 03/27/2018   Procedure: CARDIOVERSION;  Surgeon: [name], MD;  Location: [facility] ORS;  Service: Cardiovascular;  Laterality: N/A;   CATARACT EXTRACTION W/ INTRAOCULAR LENS  IMPLANT, BILATERAL  01/2006-02-2006   CHOLECYSTECTOMY N/A 11/11/2012   Procedure: LAPAROSCOPIC CHOLECYSTECTOMY WITH INTRAOPERATIVE CHOLANGIOGRAM;  Surgeon: Donnice POUR. Belinda, MD;  Location: [facility] OR;  Service: General;  Laterality: N/A;   COLONOSCOPY WITH PROPOFOL  N/A 02/28/2019   Procedure: COLONOSCOPY WITH PROPOFOL ;  Surgeon: Teressa Toribio SQUIBB, MD;  Location: [facility] ENDOSCOPY;  Service: Endoscopy;  Laterality: N/A;   ENTEROSCOPY N/A 03/30/2020   Procedure: ENTEROSCOPY;  Surgeon: San Sandor GAILS, DO;  Location: [facility] ENDOSCOPY;  Service: Gastroenterology;  Laterality: N/A;   ENTEROSCOPY N/A 02/10/2021   Procedure:  ENTEROSCOPY;  Surgeon: Rollin Dover, MD;  Location: [facility] ENDOSCOPY;  Service: Endoscopy;  Laterality: N/A;   ENTEROSCOPY N/A 02/15/2022   Procedure: ENTEROSCOPY;  Surgeon: Federico Rosario BROCKS, MD;  Location: [facility] ENDOSCOPY;  Service: Gastroenterology;  Laterality: N/A;   ENTEROSCOPY N/A 11/06/2022   Procedure: ENTEROSCOPY;  Surgeon: Wilhelmenia Aloha Raddle., MD;  Location: [facility] ENDOSCOPY;  Service: Gastroenterology;  Laterality: N/A;   EP IMPLANTABLE DEVICE N/A 02/21/2016   Procedure: ICD Generator Changeout;  Surgeon: Elspeth BROCKS Sage, MD;  Location: [facility] INVASIVE CV LAB;  Service: Cardiovascular;  Laterality: N/A;   ESOPHAGOGASTRODUODENOSCOPY N/A 03/04/2022   Procedure: ESOPHAGOGASTRODUODENOSCOPY (EGD);  Surgeon: Wilhelmenia Aloha Raddle., MD;  Location: [facility] ENDOSCOPY;  Service: Gastroenterology;  Laterality: N/A;   ESOPHAGOGASTRODUODENOSCOPY (EGD) WITH PROPOFOL  N/A 02/28/2019   Procedure: ESOPHAGOGASTRODUODENOSCOPY (EGD) WITH PROPOFOL ;  Surgeon: Teressa Toribio SQUIBB, MD;  Location: [facility] ENDOSCOPY;  Service: Endoscopy;  Laterality: N/A;   EUS N/A 03/04/2022   Procedure: UPPER ENDOSCOPIC ULTRASOUND (EUS) RADIAL;  Surgeon: Wilhelmenia Aloha Raddle., MD;  Location: [facility] ENDOSCOPY;  Service: Gastroenterology;  Laterality: N/A;   FINE NEEDLE ASPIRATION N/A 03/04/2022   Procedure: FINE NEEDLE ASPIRATION (FNA) LINEAR;  Surgeon: Wilhelmenia Aloha Raddle., MD;  Location: [facility] ENDOSCOPY;  Service: Gastroenterology;  Laterality: N/A;   GIVENS CAPSULE STUDY N/A 03/15/2019   Procedure: GIVENS CAPSULE STUDY;  Surgeon: Eda Iha, MD;  Location: [facility] ENDOSCOPY;  Service: Gastroenterology;  Laterality: N/A;   HOT HEMOSTASIS N/A 03/30/2020   Procedure: HOT HEMOSTASIS (ARGON PLASMA COAGULATION/BICAP);  Surgeon: San Sandor GAILS, DO;  Location: [facility] ENDOSCOPY;  Service: Gastroenterology;  Laterality: N/A;   HOT HEMOSTASIS N/A 02/10/2021   Procedure: HOT HEMOSTASIS (ARGON PLASMA COAGULATION/BICAP);  Surgeon: Rollin Dover, MD;  Location: [facility]  ENDOSCOPY;  Service: Endoscopy;  Laterality: N/A;   HOT HEMOSTASIS N/A 02/15/2022   Procedure: HOT HEMOSTASIS (ARGON PLASMA COAGULATION/BICAP);  Surgeon: Federico Rosario BROCKS, MD;  Location: [facility] ENDOSCOPY;  Service: Gastroenterology;  Laterality: N/A;   HOT HEMOSTASIS N/A 11/06/2022   Procedure: HOT HEMOSTASIS (ARGON PLASMA COAGULATION/BICAP);  Surgeon: Wilhelmenia Aloha Raddle., MD;  Location: [facility] ENDOSCOPY;  Service: Gastroenterology;  Laterality: N/A;   INSERT / REPLACE / REMOVE PACEMAKER  05-01-11   02-28-05-/05-04-10-ICD-MEDTRONIC MAXIMAL DR   IR ANGIOGRAM SELECTIVE EACH ADDITIONAL VESSEL  02/18/2022   IR ANGIOGRAM SELECTIVE EACH ADDITIONAL VESSEL  02/18/2022   IR ANGIOGRAM VISCERAL SELECTIVE  02/16/2022   IR ANGIOGRAM VISCERAL SELECTIVE  02/16/2022   IR EMBO ART  VEN HEMORR LYMPH EXTRAV  INC GUIDE ROADMAPPING  02/16/2022   IR THORACENTESIS ASP PLEURAL SPACE W/IMG GUIDE  02/21/2022   IR THORACENTESIS ASP PLEURAL SPACE W/IMG GUIDE  02/22/2022   IR US  GUIDE VASC ACCESS RIGHT  02/16/2022   JOINT REPLACEMENT     LAPAROSCOPIC CHOLECYSTECTOMY  11/11/2012   LAPAROSCOPIC LYSIS OF ADHESIONS N/A 11/11/2012   Procedure: LAPAROSCOPIC LYSIS OF ADHESIONS;  Surgeon: Donnice POUR. Belinda, MD;  Location: [facility] OR;  Service: General;  Laterality: N/A;   NEPHRECTOMY Right 06/2001    S/P RENAL CELL CANCER   PERIPHERAL VASCULAR INTERVENTION Bilateral 02/12/2021   Procedure: PERIPHERAL VASCULAR INTERVENTION;  Surgeon: Sheree Penne Bruckner, MD;  Location: [facility] INVASIVE CV LAB;  Service: Cardiovascular;  Laterality: Bilateral;  Iliac artery stents   PRESSURE SENSOR/CARDIOMEMS N/A 02/03/2019   Procedure: PRESSURE SENSOR/CARDIOMEMS;  Surgeon: [name], MD;  Location: [facility] INVASIVE CV LAB;  Service: Cardiovascular;  Laterality: N/A;   RIGHT HEART CATH N/A 11/09/2018   Procedure: RIGHT HEART CATH;  Surgeon: [name], MD;  Location: [facility] INVASIVE CV LAB;  Service: Cardiovascular;  Laterality: N/A;   RIGHT HEART CATH N/A  03/08/2019   Procedure: RIGHT HEART CATH;  Surgeon: [name], MD;  Location: [facility] INVASIVE CV LAB;  Service: Cardiovascular;  Laterality: N/A;   SUBMUCOSAL TATTOO INJECTION  02/28/2019   Procedure: SUBMUCOSAL TATTOO INJECTION;  Surgeon: Teressa Toribio SQUIBB, MD;  Location: [facility] ENDOSCOPY;  Service: Endoscopy;;   SUBMUCOSAL TATTOO INJECTION  11/06/2022   Procedure: SUBMUCOSAL TATTOO INJECTION;  Surgeon: Wilhelmenia Aloha Raddle., MD;  Location: [facility] ENDOSCOPY;  Service: Gastroenterology;;   TOTAL HIP ARTHROPLASTY Right 05/03/2011   Procedure: TOTAL HIP ARTHROPLASTY ANTERIOR APPROACH;  Surgeon: Bruckner CINDERELLA Poli;  Location: [facility] ORS;  Service: Orthopedics;  Laterality: Right;  Removal of Cannulated Screws Right Hip, Right Direct Anterior Hip Replacement   TOTAL HIP ARTHROPLASTY Left 10/09/2018   Procedure: TOTAL HIP ARTHROPLASTY ANTERIOR APPROACH;  Surgeon: Fidel Rogue, MD;  Location: [facility] OR;  Service: Orthopedics;  Laterality: Left;     Outpatient Encounter Medications  as of 05/14/2023  Medication Sig   acetaminophen  (TYLENOL ) 500 MG tablet Take 1,000 mg by mouth as needed.   Ascorbic Acid  (VITAMIN C  PO) Take 1 tablet by mouth daily.   Cholecalciferol  (VITAMIN D -3 PO) Take 2 capsules by mouth daily.   Cyanocobalamin  (B-12 PO) Take 1 tablet by mouth daily.   doxycycline  (VIBRAMYCIN ) 100 MG capsule Take 1 capsule (100 mg total) by mouth 2 (two) times daily.   ferrous gluconate  (FERGON) 324 MG tablet Take 1 tablet (324 mg total) by mouth daily with breakfast.   fluticasone  (FLONASE ) 50 MCG/ACT nasal spray Place 2 sprays into both nostrils daily as needed for allergies or rhinitis.   metolazone  (ZAROXOLYN ) 2.5 MG tablet Take 1 tablet (2.5 mg total) by mouth 2 (two) times a week. Every Tuesday and  Saturday with 40 meq of potassium   Multiple Vitamins-Minerals (HAIR SKIN NAILS PO) Take 1 capsule by mouth daily.   pantoprazole  (PROTONIX ) 40 MG tablet Take 1 tablet (40 mg total) by mouth daily.    potassium chloride  SA (KLOR-CON  M) 20 MEQ tablet Take 2 tablets (40 mEq total) by mouth every morning AND 1 tablet (20 mEq total) at bedtime. Take extra 2 tabs when you take metolazone .   potassium chloride  SA (KLOR-CON  M) 20 MEQ tablet Take 1 tablet (20 mEq total) by mouth once a week. Weekly with Metolazone  dose every Saturday   rosuvastatin  (CRESTOR ) 40 MG tablet Take 1 tablet (40 mg total) by mouth daily.   torsemide  (DEMADEX ) 20 MG tablet Take 5 tablets (100 mg total) by mouth 2 (two) times daily.   No facility-administered encounter medications on file as of 05/14/2023.     Today's Vitals   05/14/23 1055  BP: (!) 117/54  Pulse: 60  Temp: 97.6 F (36.4 C)  TempSrc: Temporal  SpO2: 98%  Weight: 141 lb 6.4 oz (64.1 kg)  Height: 5' 6 (1.676 m)   Body mass index is 22.82 kg/m.   PHYSICAL EXAM GENERAL:alert, no distress and comfortable SKIN: malodorous weeping legs wrapped in age bandage  EYES: sclera clear LUNGS: clear with normal breathing effort HEART: regular rate & rhythm ABDOMEN: abdomen slightly firm, non distended, normal bowel sounds NEURO: alert & oriented x 3 with fluent speech  CBC    Component Value Date/Time   WBC 5.0 05/14/2023 0958   WBC 3.2 (L) 02/05/2023 0302   RBC 3.10 (L) 05/14/2023 0958   HGB 7.9 (L) 05/14/2023 0958   HGB 8.1 (L) 03/22/2019 1415   HCT 26.6 (L) 05/14/2023 0958   HCT 25.9 (L) 03/22/2019 1415   PLT 233 05/14/2023 0958   PLT 226 03/22/2019 1415   MCV 85.8 05/14/2023 0958   MCV 88 03/22/2019 1415   MCH 25.5 (L) 05/14/2023 0958   MCHC 29.7 (L) 05/14/2023 0958   RDW 20.8 (H) 05/14/2023 0958   RDW 19.9 (H) 03/22/2019 1415   LYMPHSABS 0.4 (L) 05/14/2023 0958   LYMPHSABS 0.6 (L) 03/22/2019 1415   MONOABS 0.5 05/14/2023 0958   EOSABS 0.0 05/14/2023 0958   EOSABS 0.2 03/22/2019 1415   BASOSABS 0.0 05/14/2023 0958   BASOSABS 0.0 03/22/2019 1415     CMP     Component Value Date/Time   NA 137 05/14/2023 0958   NA 136 03/26/2023 1315    K 4.5 05/14/2023 0958   CL 100 05/14/2023 0958   CO2 31 05/14/2023 0958   GLUCOSE 158 (H) 05/14/2023 0958   BUN 51 (H) 05/14/2023 0958   BUN 56 (H)  03/26/2023 1315   CREATININE 1.66 (H) 05/14/2023 0958   CREATININE 1.34 (H) 02/12/2022 1655   CALCIUM  9.1 05/14/2023 0958   PROT 7.6 05/14/2023 0958   PROT 7.2 06/09/2014 1317   ALBUMIN  3.8 05/14/2023 0958   ALBUMIN  3.8 06/09/2014 1317   AST 14 (L) 05/14/2023 0958   ALT 7 05/14/2023 0958   ALT 23 06/09/2014 1317   ALKPHOS 61 05/14/2023 0958   ALKPHOS 71 06/09/2014 1317   BILITOT 0.6 05/14/2023 0958   GFRNONAA 30 (L) 05/14/2023 0958   GFRAA 39 (L) 12/23/2019 1458     ASSESSMENT & PLAN: 89 year old female with   Neuroendocrine tumor of pancreatic head, 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  -Outpatient EUS 03/04/2022 by Dr. 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 -She began first line sandostatin  injections q28 days on 03/27/22.  -Dotatate PET scan, done 2 weeks after first injection 04/10/22, shows no significant tracer avidity in the pancrease or distant sites. Findings are likely due to close timing relative to the sandostatin  injection blocking receptors. When we repeat this for restaging, will arrange 4 weeks after sandostatin  -[name] appears stable, she has increased abdominal fullness and pain.  Last chromogranin A 02/26/2023 elevated to 108, today's level is pending -Proceed with Sandostatin   today, we will arrange restaging dotatate PET scan to be done in 4 weeks   Multifactorial anemia, secondary to GI bleed, h/o IDA and B12 deficiency, and anemia of chronic disease -She has a longstanding history of anemia for at least 10 years, likely more; and history of GI bleeding from AVMs -She has received various formulations of IV iron  in the past (ferlecit in 2020, Feraheme  in 02/2022), B12 injections (last in 2021), and ESA (Retacrit , 2021) -During hospitalization 02/13/22 - 03/01/2022 she had 2 duodenal angioectasias that were treated with APC, 2 doses of Feraheme , and a total of 6 units of red cell transfusions (she had a transfusion reaction and required ICU placement and premeds for subsequent transfusions); melena resolved at discharge -Continues to have severe anemia, requiring blood transfusions lately -Restarted Retacrit  12/17/22 q2 weeks, last given 04/09/23 -[name] appears stable. Denies active bleeding. Hgb 7.9, asymptomatic over baseline. Will hold RBC today and resume retacrit  -Last ferritin 16 in 03/2023, has not had IV iron  since 02/03/23, will add IV iron  over the next few weeks -Given Bevacizumab  with partial improvement and anemia worsened off therapy, and will resume today -We discussed the risk/benefit in the setting of her weeping legs and pending foot biopsy. Surgery is not scheduled yet. Will advise dermatology and proceed with beva with caution   3. Afib, CHF, carotid stenosis, HTN, ICD placement -She is on Eliquis  for permanent Afib, cardiology prefers she stay on this for high risk. -We will monitor hgb closely given her bleeding risk on ac.  -recent echo 02/15/2022 showed LVEF 60-65% -leg edema improving on torsemide  -Continue PCP and cardiology follow-up   4. H/o Renal cell carcinoma and CKD -S/p right nephrectomy in 2003 -  Baseline SCr 1.2-1.8 since 2019 -Stable   5. Social -Patient's 93 year old great grandson lives with her, he is a it sales professional and  assists around the house -Has 2 children -She has strong family support   6. Goals of care -She met with palliative care team while in the hospital to establish relationship and goals of care -She understands that without surgery this is not curable disease, but still treatable; she is interested in treatment for now; goal is palliative   PLAN: -Labs reviewed -Proceed with Beva (q2 weeks), Retacrit  (q2 weeks), and Sandostatin  (q4 weeks) today -Lab, Retacrit , and IV Venofer  in 2 weeks, with RBC if needed per parameters -Restaging Dotatate PET scan in 4 weeks -F/up in 2 and 4 weeks  -Leg wound and skin cancer of the foot per derm/wound care  Orders Placed This Encounter  Procedures   NM PET DOTATATE SKULL BASE TO MID THIGH    Standing Status:   Future    Expected Date:   06/09/2023    Expiration Date:   05/13/2024    If indicated for the ordered procedure, I authorize the administration of a radiopharmaceutical per Radiology protocol:   Yes    Preferred imaging location?:   [facility]      All questions were answered. The patient knows to call the clinic with any problems, questions or concerns. No barriers to learning were detected. I spent 20 minutes counseling the patient face to face. The total time spent in the appointment was 30 minutes and more than 50% was on counseling, review of test results, and coordination of care.   Herman Mell, NP-C 05/14/2023

## 2023-05-15 ENCOUNTER — Encounter (HOSPITAL_COMMUNITY): Payer: PPO

## 2023-05-16 LAB — CHROMOGRANIN A: Chromogranin A (ng/mL): 89.7 ng/mL (ref 0.0–101.8)

## 2023-05-19 ENCOUNTER — Ambulatory Visit: Payer: Self-pay

## 2023-05-19 NOTE — Patient Outreach (Signed)
  Care Coordination   Initial Visit Note   05/27/2023 Name: [name] MRN: [id number] DOB: [birth year]  [name] is a 89 y.o. year old female who sees [name], MD for primary care. I spoke with  [name] CHRISTELLA Lamer by phone today.  What matters to the patients health and wellness today?  Per chart review patient hospitalized 01/15/23-01/18/23 for symptomatic anemia and HF exacerbation and 01/30/23 - 02/06/23 both for symptomatic anemia. Patient states overall she feels tired.  She reports ongoing swelling in her leg and SOB with activity.  Denies chest pain.  Reports intermittent dizziness upon standing. Patient reports wearing Oxygen  at 2 L and uses wheelchair for long distances. Patient reports provider increased her metolazone  and she continues torsemide . She reports having visit with oncologist on 04/25/23.  She reports starting doxycycline  antibiotic x 7 days ago due to tender skin lesion and weeping in legs. She reports next follow up visit with oncology is 05/14/23.  Patient states her grandson lives with her and assist with her care. She reports follow up visit with dermatologist is 05/21/23 for biopsy.    Goals Addressed             This Visit's Progress    Education and management of health conditions       Interventions Today    Flowsheet Row Most Recent Value  Chronic Disease   Chronic disease during today's visit Congestive Heart Failure (CHF), Other  [left foot skin cancer, anemia due to pancreatic tumor and AVM, Bilateral leg weeping/ lesions]  General Interventions   General Interventions Discussed/Reviewed General Interventions Discussed, Doctor Visits  [evaluation of current treatment plan related to listed health conditions and patients adherence to plan as established by provider.  Assessed for HF symptoms and new/ ongoing symptoms.]  Doctor Visits Discussed/Reviewed Doctor Visits Discussed  [facility] upcoming provider visits.  Advised to keep follow up  visit with providers as recommended.]  Education Interventions   Education Provided Provided Education  [reviewed signs and symptoms of infection. Advised to notify provider if infection symptoms noticed.  Advised to elevate legs when sitting and or laying down. Confirmed patient has assistance in the home with care.]  Pharmacy Interventions   Pharmacy Dicussed/Reviewed Pharmacy Topics Discussed  [medications reviewed and compliance discussed and advised., ADvised to take antibiotic until completed.]  Safety Interventions   Safety Discussed/Reviewed Safety Discussed, Fall Risk  [Safety concerns related to postual dizziness.  Fall precautions discussed. Advised to use ambulatory devices as recommended by provider.]              SDOH assessments and interventions completed:  Yes  SDOH Interventions Today    Flowsheet Row Most Recent Value  SDOH Interventions   Food Insecurity Interventions Intervention Not Indicated  Housing Interventions Intervention Not Indicated  Utilities Interventions Intervention Not Indicated        Care Coordination Interventions:  Yes, provided   Follow up plan: Follow up call scheduled for 06/10/23 at 3 pm    Encounter Outcome:  Patient Visit Completed   Buddie Marston RN, BSN, CCM [place]  [facility], Population Health Case Manager Phone: [phone]

## 2023-05-21 ENCOUNTER — Telehealth: Payer: Self-pay | Admitting: Nurse Practitioner

## 2023-05-21 NOTE — Telephone Encounter (Signed)
 Patient's son had answered the phone; left a message with him in regards to changed appointment times/dates, I also gave patient's son my callback so that if they had any questions or concerns they could give me a call back

## 2023-05-22 ENCOUNTER — Inpatient Hospital Stay: Payer: PPO

## 2023-05-27 ENCOUNTER — Emergency Department (HOSPITAL_COMMUNITY): Payer: PPO

## 2023-05-27 ENCOUNTER — Inpatient Hospital Stay: Payer: PPO

## 2023-05-27 ENCOUNTER — Other Ambulatory Visit: Payer: Self-pay

## 2023-05-27 ENCOUNTER — Encounter (HOSPITAL_COMMUNITY): Payer: Self-pay | Admitting: Emergency Medicine

## 2023-05-27 ENCOUNTER — Inpatient Hospital Stay (HOSPITAL_COMMUNITY)
Admission: EM | Admit: 2023-05-27 | Discharge: 2023-06-01 | DRG: 291 | Disposition: A | Discharge status: Home Health | Payer: PPO | Attending: Family Medicine | Admitting: Family Medicine

## 2023-05-27 DIAGNOSIS — E7800 Pure hypercholesterolemia, unspecified: Secondary | ICD-10-CM | POA: Diagnosis not present

## 2023-05-27 DIAGNOSIS — R2689 Other abnormalities of gait and mobility: Secondary | ICD-10-CM | POA: Diagnosis not present

## 2023-05-27 DIAGNOSIS — J9621 Acute and chronic respiratory failure with hypoxia: Secondary | ICD-10-CM | POA: Insufficient documentation

## 2023-05-27 DIAGNOSIS — I5023 Acute on chronic systolic (congestive) heart failure: Secondary | ICD-10-CM | POA: Diagnosis not present

## 2023-05-27 DIAGNOSIS — J449 Chronic obstructive pulmonary disease, unspecified: Secondary | ICD-10-CM | POA: Diagnosis not present

## 2023-05-27 DIAGNOSIS — E785 Hyperlipidemia, unspecified: Secondary | ICD-10-CM | POA: Diagnosis present

## 2023-05-27 DIAGNOSIS — I130 Hypertensive heart and chronic kidney disease with heart failure and stage 1 through stage 4 chronic kidney disease, or unspecified chronic kidney disease: Secondary | ICD-10-CM | POA: Diagnosis not present

## 2023-05-27 DIAGNOSIS — Z90710 Acquired absence of both cervix and uterus: Secondary | ICD-10-CM

## 2023-05-27 DIAGNOSIS — I272 Pulmonary hypertension, unspecified: Secondary | ICD-10-CM | POA: Diagnosis present

## 2023-05-27 DIAGNOSIS — I517 Cardiomegaly: Secondary | ICD-10-CM | POA: Diagnosis not present

## 2023-05-27 DIAGNOSIS — D500 Iron deficiency anemia secondary to blood loss (chronic): Secondary | ICD-10-CM | POA: Diagnosis not present

## 2023-05-27 DIAGNOSIS — Z9049 Acquired absence of other specified parts of digestive tract: Secondary | ICD-10-CM

## 2023-05-27 DIAGNOSIS — I5082 Biventricular heart failure: Secondary | ICD-10-CM | POA: Diagnosis not present

## 2023-05-27 DIAGNOSIS — L03116 Cellulitis of left lower limb: Secondary | ICD-10-CM | POA: Diagnosis not present

## 2023-05-27 DIAGNOSIS — D631 Anemia in chronic kidney disease: Secondary | ICD-10-CM | POA: Diagnosis not present

## 2023-05-27 DIAGNOSIS — D649 Anemia, unspecified: Secondary | ICD-10-CM

## 2023-05-27 DIAGNOSIS — J90 Pleural effusion, not elsewhere classified: Secondary | ICD-10-CM | POA: Diagnosis not present

## 2023-05-27 DIAGNOSIS — I700 Atherosclerosis of aorta: Secondary | ICD-10-CM | POA: Diagnosis not present

## 2023-05-27 DIAGNOSIS — R0989 Other specified symptoms and signs involving the circulatory and respiratory systems: Secondary | ICD-10-CM | POA: Diagnosis not present

## 2023-05-27 DIAGNOSIS — I4821 Permanent atrial fibrillation: Secondary | ICD-10-CM | POA: Diagnosis not present

## 2023-05-27 DIAGNOSIS — Z66 Do not resuscitate: Secondary | ICD-10-CM | POA: Diagnosis present

## 2023-05-27 DIAGNOSIS — Z85528 Personal history of other malignant neoplasm of kidney: Secondary | ICD-10-CM

## 2023-05-27 DIAGNOSIS — R627 Adult failure to thrive: Secondary | ICD-10-CM | POA: Diagnosis not present

## 2023-05-27 DIAGNOSIS — I2510 Atherosclerotic heart disease of native coronary artery without angina pectoris: Secondary | ICD-10-CM | POA: Diagnosis present

## 2023-05-27 DIAGNOSIS — K5520 Angiodysplasia of colon without hemorrhage: Secondary | ICD-10-CM

## 2023-05-27 DIAGNOSIS — Z9221 Personal history of antineoplastic chemotherapy: Secondary | ICD-10-CM

## 2023-05-27 DIAGNOSIS — N1832 Chronic kidney disease, stage 3b: Secondary | ICD-10-CM | POA: Diagnosis present

## 2023-05-27 DIAGNOSIS — N183 Chronic kidney disease, stage 3 unspecified: Secondary | ICD-10-CM | POA: Diagnosis present

## 2023-05-27 DIAGNOSIS — I878 Other specified disorders of veins: Secondary | ICD-10-CM | POA: Diagnosis present

## 2023-05-27 DIAGNOSIS — I083 Combined rheumatic disorders of mitral, aortic and tricuspid valves: Secondary | ICD-10-CM | POA: Diagnosis not present

## 2023-05-27 DIAGNOSIS — R5381 Other malaise: Secondary | ICD-10-CM | POA: Diagnosis not present

## 2023-05-27 DIAGNOSIS — I428 Other cardiomyopathies: Secondary | ICD-10-CM

## 2023-05-27 DIAGNOSIS — I5030 Unspecified diastolic (congestive) heart failure: Secondary | ICD-10-CM | POA: Insufficient documentation

## 2023-05-27 DIAGNOSIS — Z8616 Personal history of COVID-19: Secondary | ICD-10-CM

## 2023-05-27 DIAGNOSIS — Z9981 Dependence on supplemental oxygen: Secondary | ICD-10-CM

## 2023-05-27 DIAGNOSIS — I5033 Acute on chronic diastolic (congestive) heart failure: Secondary | ICD-10-CM | POA: Diagnosis present

## 2023-05-27 DIAGNOSIS — M19041 Primary osteoarthritis, right hand: Secondary | ICD-10-CM | POA: Diagnosis present

## 2023-05-27 DIAGNOSIS — L03115 Cellulitis of right lower limb: Secondary | ICD-10-CM | POA: Diagnosis present

## 2023-05-27 DIAGNOSIS — Z87891 Personal history of nicotine dependence: Secondary | ICD-10-CM

## 2023-05-27 DIAGNOSIS — I509 Heart failure, unspecified: Secondary | ICD-10-CM

## 2023-05-27 DIAGNOSIS — Z885 Allergy status to narcotic agent status: Secondary | ICD-10-CM

## 2023-05-27 DIAGNOSIS — R0602 Shortness of breath: Secondary | ICD-10-CM | POA: Diagnosis not present

## 2023-05-27 DIAGNOSIS — M19042 Primary osteoarthritis, left hand: Secondary | ICD-10-CM | POA: Diagnosis not present

## 2023-05-27 DIAGNOSIS — Z881 Allergy status to other antibiotic agents status: Secondary | ICD-10-CM

## 2023-05-27 DIAGNOSIS — I422 Other hypertrophic cardiomyopathy: Secondary | ICD-10-CM

## 2023-05-27 DIAGNOSIS — Z96643 Presence of artificial hip joint, bilateral: Secondary | ICD-10-CM | POA: Diagnosis present

## 2023-05-27 DIAGNOSIS — Z7901 Long term (current) use of anticoagulants: Secondary | ICD-10-CM

## 2023-05-27 DIAGNOSIS — N189 Chronic kidney disease, unspecified: Secondary | ICD-10-CM | POA: Diagnosis present

## 2023-05-27 DIAGNOSIS — Z91048 Other nonmedicinal substance allergy status: Secondary | ICD-10-CM

## 2023-05-27 DIAGNOSIS — I110 Hypertensive heart disease with heart failure: Secondary | ICD-10-CM | POA: Diagnosis not present

## 2023-05-27 DIAGNOSIS — Z886 Allergy status to analgesic agent status: Secondary | ICD-10-CM

## 2023-05-27 DIAGNOSIS — Z79899 Other long term (current) drug therapy: Secondary | ICD-10-CM

## 2023-05-27 DIAGNOSIS — I739 Peripheral vascular disease, unspecified: Secondary | ICD-10-CM | POA: Diagnosis not present

## 2023-05-27 DIAGNOSIS — D3A8 Other benign neuroendocrine tumors: Secondary | ICD-10-CM | POA: Diagnosis present

## 2023-05-27 DIAGNOSIS — E871 Hypo-osmolality and hyponatremia: Secondary | ICD-10-CM | POA: Diagnosis not present

## 2023-05-27 DIAGNOSIS — Z95810 Presence of automatic (implantable) cardiac defibrillator: Secondary | ICD-10-CM | POA: Diagnosis present

## 2023-05-27 DIAGNOSIS — Z888 Allergy status to other drugs, medicaments and biological substances status: Secondary | ICD-10-CM

## 2023-05-27 DIAGNOSIS — Z85828 Personal history of other malignant neoplasm of skin: Secondary | ICD-10-CM

## 2023-05-27 DIAGNOSIS — Z8249 Family history of ischemic heart disease and other diseases of the circulatory system: Secondary | ICD-10-CM

## 2023-05-27 DIAGNOSIS — Z905 Acquired absence of kidney: Secondary | ICD-10-CM

## 2023-05-27 DIAGNOSIS — M6281 Muscle weakness (generalized): Secondary | ICD-10-CM | POA: Diagnosis not present

## 2023-05-27 DIAGNOSIS — C259 Malignant neoplasm of pancreas, unspecified: Secondary | ICD-10-CM | POA: Diagnosis not present

## 2023-05-27 LAB — TSH: TSH: 2.178 u[IU]/mL (ref 0.350–4.500)

## 2023-05-27 LAB — COMPREHENSIVE METABOLIC PANEL
ALT: 12 U/L (ref 0–44)
AST: 26 U/L (ref 15–41)
Albumin: 3.3 g/dL — ABNORMAL LOW (ref 3.5–5.0)
Alkaline Phosphatase: 66 U/L (ref 38–126)
Anion gap: 11 (ref 5–15)
BUN: 62 mg/dL — ABNORMAL HIGH (ref 8–23)
CO2: 23 mmol/L (ref 22–32)
Calcium: 8.8 mg/dL — ABNORMAL LOW (ref 8.9–10.3)
Chloride: 98 mmol/L (ref 98–111)
Creatinine, Ser: 1.62 mg/dL — ABNORMAL HIGH (ref 0.44–1.00)
GFR, Estimated: 31 mL/min — ABNORMAL LOW (ref >60)
Glucose, Bld: 118 mg/dL — ABNORMAL HIGH (ref 70–99)
Potassium: 3.6 mmol/L (ref 3.5–5.1)
Sodium: 132 mmol/L — ABNORMAL LOW (ref 135–145)
Total Bilirubin: 1.2 mg/dL (ref 0.0–1.2)
Total Protein: 7.5 g/dL (ref 6.5–8.1)

## 2023-05-27 LAB — CBC WITH DIFFERENTIAL/PLATELET
Abs Immature Granulocytes: 0.02 10*3/uL (ref 0.00–0.07)
Basophils Absolute: 0 10*3/uL (ref 0.0–0.1)
Basophils Relative: 1 %
Eosinophils Absolute: 0 10*3/uL (ref 0.0–0.5)
Eosinophils Relative: 0 %
HCT: 26.4 % — ABNORMAL LOW (ref 36.0–46.0)
Hemoglobin: 7.5 g/dL — ABNORMAL LOW (ref 12.0–15.0)
Immature Granulocytes: 0 %
Lymphocytes Relative: 4 %
Lymphs Abs: 0.3 10*3/uL — ABNORMAL LOW (ref 0.7–4.0)
MCH: 23.8 pg — ABNORMAL LOW (ref 26.0–34.0)
MCHC: 28.4 g/dL — ABNORMAL LOW (ref 30.0–36.0)
MCV: 83.8 fL (ref 80.0–100.0)
Monocytes Absolute: 0.7 10*3/uL (ref 0.1–1.0)
Monocytes Relative: 10 %
Neutro Abs: 5.7 10*3/uL (ref 1.7–7.7)
Neutrophils Relative %: 85 %
Platelets: 251 10*3/uL (ref 150–400)
RBC: 3.15 MIL/uL — ABNORMAL LOW (ref 3.87–5.11)
RDW: 20.3 % — ABNORMAL HIGH (ref 11.5–15.5)
WBC: 6.7 10*3/uL (ref 4.0–10.5)
nRBC: 0.3 % — ABNORMAL HIGH (ref 0.0–0.2)

## 2023-05-27 LAB — HEMOGLOBIN A1C
Hgb A1c MFr Bld: 5 % (ref 4.8–5.6)
Mean Plasma Glucose: 96.8 mg/dL

## 2023-05-27 LAB — BRAIN NATRIURETIC PEPTIDE: B Natriuretic Peptide: 416.4 pg/mL — ABNORMAL HIGH (ref 0.0–100.0)

## 2023-05-27 LAB — TROPONIN I (HIGH SENSITIVITY)
Troponin I (High Sensitivity): 39 ng/L — ABNORMAL HIGH (ref <18)
Troponin I (High Sensitivity): 35 ng/L — ABNORMAL HIGH (ref <18)

## 2023-05-27 MED ORDER — CEFAZOLIN SODIUM-DEXTROSE 1-4 GM/50ML-% IV SOLN
1.0000 g | Freq: Two times a day (BID) | INTRAVENOUS | Status: DC
  Administered 2023-05-27: 1 g via INTRAVENOUS
  Filled 2023-05-27 (×2): qty 50

## 2023-05-27 MED ORDER — POLYETHYLENE GLYCOL 3350 17 G PO PACK: 17.0000 g | PACK | Freq: Every day | ORAL | Status: DC | PRN

## 2023-05-27 MED ORDER — VITAMIN D 25 MCG (1000 UNIT) PO TABS
1000.0000 [IU] | ORAL_TABLET | Freq: Every day | ORAL | Status: DC
  Administered 2023-05-28 – 2023-06-01 (×5): 1000 [IU] via ORAL
  Filled 2023-05-27 (×5): qty 1

## 2023-05-27 MED ORDER — HYDROCODONE-ACETAMINOPHEN 5-325 MG PO TABS: 1.0000 | ORAL_TABLET | ORAL | Status: DC | PRN

## 2023-05-27 MED ORDER — METOLAZONE 2.5 MG PO TABS
2.5000 mg | ORAL_TABLET | Freq: Once | ORAL | Status: AC
  Administered 2023-05-27: 2.5 mg via ORAL
  Filled 2023-05-27: qty 1

## 2023-05-27 MED ORDER — ONDANSETRON HCL 4 MG PO TABS: 4.0000 mg | ORAL_TABLET | Freq: Four times a day (QID) | ORAL | Status: DC | PRN

## 2023-05-27 MED ORDER — HEPARIN SODIUM (PORCINE) 5000 UNIT/ML IJ SOLN
5000.0000 [IU] | Freq: Three times a day (TID) | INTRAMUSCULAR | Status: DC
  Administered 2023-05-27 – 2023-06-01 (×15): 5000 [IU] via SUBCUTANEOUS
  Filled 2023-05-27 (×15): qty 1

## 2023-05-27 MED ORDER — FERROUS GLUCONATE 324 (38 FE) MG PO TABS
324.0000 mg | ORAL_TABLET | Freq: Every day | ORAL | Status: DC
  Administered 2023-05-28 – 2023-06-01 (×5): 324 mg via ORAL
  Filled 2023-05-27 (×7): qty 1

## 2023-05-27 MED ORDER — PANTOPRAZOLE SODIUM 40 MG PO TBEC
40.0000 mg | DELAYED_RELEASE_TABLET | Freq: Every day | ORAL | Status: DC
  Administered 2023-05-27 – 2023-06-01 (×6): 40 mg via ORAL
  Filled 2023-05-27 (×6): qty 1

## 2023-05-27 MED ORDER — SODIUM CHLORIDE 0.9 % IV SOLN: 250.0000 mL | INTRAVENOUS | Status: AC | PRN

## 2023-05-27 MED ORDER — VANCOMYCIN HCL IN DEXTROSE 1-5 GM/200ML-% IV SOLN
1000.0000 mg | Freq: Once | INTRAVENOUS | Status: AC
  Administered 2023-05-27: 1000 mg via INTRAVENOUS
  Filled 2023-05-27: qty 200

## 2023-05-27 MED ORDER — POTASSIUM CHLORIDE CRYS ER 20 MEQ PO TBCR
40.0000 meq | EXTENDED_RELEASE_TABLET | Freq: Every morning | ORAL | Status: DC
Start: 2023-05-28 — End: 2023-06-01
  Administered 2023-05-28 – 2023-05-31 (×4): 40 meq via ORAL
  Filled 2023-05-27 (×4): qty 2

## 2023-05-27 MED ORDER — SODIUM CHLORIDE 0.9% FLUSH
3.0000 mL | Freq: Two times a day (BID) | INTRAVENOUS | Status: DC
  Administered 2023-05-27 – 2023-06-01 (×10): 3 mL via INTRAVENOUS

## 2023-05-27 MED ORDER — ACETAMINOPHEN 650 MG RE SUPP: 650.0000 mg | Freq: Four times a day (QID) | RECTAL | Status: DC | PRN

## 2023-05-27 MED ORDER — VITAMIN C 500 MG PO TABS
500.0000 mg | ORAL_TABLET | Freq: Every day | ORAL | Status: DC
Start: 2023-05-27 — End: 2023-06-01
  Administered 2023-05-27 – 2023-06-01 (×6): 500 mg via ORAL
  Filled 2023-05-27 (×6): qty 1

## 2023-05-27 MED ORDER — POTASSIUM CHLORIDE CRYS ER 20 MEQ PO TBCR
40.0000 meq | EXTENDED_RELEASE_TABLET | Freq: Every day | ORAL | Status: DC
Start: 2023-05-28 — End: 2023-05-27

## 2023-05-27 MED ORDER — FUROSEMIDE 10 MG/ML IJ SOLN
40.0000 mg | Freq: Once | INTRAMUSCULAR | Status: AC
  Administered 2023-05-27: 40 mg via INTRAVENOUS
  Filled 2023-05-27: qty 4

## 2023-05-27 MED ORDER — FUROSEMIDE 10 MG/ML IJ SOLN
80.0000 mg | Freq: Two times a day (BID) | INTRAMUSCULAR | Status: DC
  Administered 2023-05-27 – 2023-05-30 (×7): 80 mg via INTRAVENOUS
  Filled 2023-05-27 (×7): qty 8

## 2023-05-27 MED ORDER — TRAMADOL HCL 50 MG PO TABS: 50.0000 mg | ORAL_TABLET | Freq: Four times a day (QID) | ORAL | Status: DC | PRN

## 2023-05-27 MED ORDER — ACETAMINOPHEN 500 MG PO TABS
1000.0000 mg | ORAL_TABLET | Freq: Three times a day (TID) | ORAL | Status: DC
  Administered 2023-05-27 – 2023-06-01 (×14): 1000 mg via ORAL
  Filled 2023-05-27 (×15): qty 2

## 2023-05-27 MED ORDER — HYDROCODONE-ACETAMINOPHEN 5-325 MG PO TABS
1.0000 | ORAL_TABLET | ORAL | Status: DC | PRN
Start: 2023-05-27 — End: 2023-05-27

## 2023-05-27 MED ORDER — DOCUSATE SODIUM 100 MG PO CAPS
100.0000 mg | ORAL_CAPSULE | Freq: Two times a day (BID) | ORAL | Status: DC
  Administered 2023-05-27 – 2023-05-31 (×3): 100 mg via ORAL
  Filled 2023-05-27 (×10): qty 1

## 2023-05-27 MED ORDER — SODIUM CHLORIDE 0.9% FLUSH: 3.0000 mL | INTRAVENOUS | Status: DC | PRN

## 2023-05-27 MED ORDER — IPRATROPIUM-ALBUTEROL 0.5-2.5 (3) MG/3ML IN SOLN: 3.0000 mL | RESPIRATORY_TRACT | Status: DC | PRN

## 2023-05-27 MED ORDER — FLUTICASONE PROPIONATE 50 MCG/ACT NA SUSP: 2.0000 | Freq: Every day | NASAL | Status: DC | PRN

## 2023-05-27 MED ORDER — CEFAZOLIN SODIUM-DEXTROSE 1-4 GM/50ML-% IV SOLN: 1.0000 g | Freq: Three times a day (TID) | INTRAVENOUS | Status: DC

## 2023-05-27 MED ORDER — ACETAMINOPHEN 325 MG PO TABS
650.0000 mg | ORAL_TABLET | Freq: Four times a day (QID) | ORAL | Status: DC | PRN
  Administered 2023-05-27 – 2023-05-28 (×2): 650 mg via ORAL
  Filled 2023-05-27 (×2): qty 2

## 2023-05-27 MED ORDER — ONDANSETRON HCL 4 MG/2ML IJ SOLN: 4.0000 mg | Freq: Four times a day (QID) | INTRAMUSCULAR | Status: DC | PRN

## 2023-05-27 MED ORDER — ROSUVASTATIN CALCIUM 20 MG PO TABS: 40.0000 mg | ORAL_TABLET | Freq: Every day | ORAL | Status: DC

## 2023-05-27 MED ORDER — MELATONIN 5 MG PO TABS
5.0000 mg | ORAL_TABLET | Freq: Once | ORAL | Status: AC
  Administered 2023-05-27: 5 mg via ORAL
  Filled 2023-05-27: qty 1

## 2023-05-27 MED ORDER — HYDRALAZINE HCL 20 MG/ML IJ SOLN: 5.0000 mg | Freq: Four times a day (QID) | INTRAMUSCULAR | Status: DC | PRN

## 2023-05-27 NOTE — Progress Notes (Signed)
A consult was received from an ED physician for vancomycin per pharmacy dosing (for an indication other than meningitis). The patient's profile has been reviewed for ht/wt/allergies/indication/available labs. A one time order has been placed for the above antibiotics.  Further antibiotics/pharmacy consults should be ordered by admitting physician if indicated.                       Bernadene Person, PharmD, BCPS [phone] 05/27/2023, 4:31 PM

## 2023-05-27 NOTE — H&P (Addendum)
[facility] History and Physical  [name]:[id number] DOB: 08/28/1933 DOA: 05/27/2023  Referring physician: ED  PCP: [name], MD   Patient is coming from: Home  Chief Complaint: Shortness of breath  HPI:  Patient is 89 years old female with past medical history of diastolic heart failure, pulmonary hypertension, status post AICD, history of GI bleed, history of neuroendocrine pancreatic cancer, chronic respiratory failure on 2 L of oxygen at baseline, history of COPD presented to the hospital with complaints of shortness of breath, fatigue for the last week and a half.  At baseline, patient uses 2 L of oxygen but has been requiring more oxygen.  Patient reported worsening lower extremity edema with generalized malaise and fatigue.  She however denied any fever, chills or rigor.  Denies any chest pain, sputum production.  Denies any urinary urgency, frequency or dysuria.  Denies any sick contacts or recent travel.  Has some dizziness but denies syncope.  In the ED, patient was noted to have blood pressure/elevated at 148/61 with a respiratory rate 24.  Patient was hypoxic in the 83s in the ED at 2 L and had to bump up to 4 L in the ED for pulse ox of 100%.  Labs were notable for hemoglobin of 7.5, WBC at 6.7.  Sodium slightly low at 132 with creatinine elevation at 1.6.  BNP was elevated at 416.  Chest x-ray showed vascular congestion.  EKG did not show any ischemic changes, intraventricular conduction delay..  Troponin I at 39.  Patient was given 1 dose of vancomycin IV, 1 dose of IV Lasix and was considered for admission to hospital for further evaluation and treatment.  Assessment and Plan Principal Problem:   Acute on chronic diastolic CHF (congestive heart failure) (HCC) Active Problems:   CKD (chronic kidney disease), stage III (HCC)   COPD (chronic obstructive pulmonary disease) (HCC)   Atrial fibrillation, permanent (HCC)   Primary pancreatic neuroendocrine  tumor   Pulmonary hypertension (HCC)   DNR (do not resuscitate)   Hypertrophic cardiomyopathy (HCC)   ICD (implantable cardioverter-defibrillator) in place   CHF (congestive heart failure) (HCC)  Acute on chronic diastolic CHF exacerbation.   History of AICD. Will admit the patient in telemetry unit. Monitor for arrythmias. Continue  IV Lasix 80 mg IV twice daily, patient used to take torsemide 100 mg twice daily at home.  Continue fluid restriction at 1500 mL/day, strict intake and output charting.  Daily weights.  Follow CHF protocol.  Trend troponins, first set at 39.  Troponins.  No chest pain..  We will closely monitor renal function on diuretics.  Review of 2 D Echocardiogram from 01/2023 with last known ejection fraction of 65 to 70% with no regional wall motion abnormality.    Cardiology Dr. Ronelle Nigh had seen the patient December 2024 as outpatient and at that time patient was advised torsemide 100 mg twice daily with metolazone 2.5 mg twice weekly and transition to palliative approach in the future if not improved.  Will also get palliative care consultation.  Please consult cardiology in AM.  Mild hyponatremia.  Will closely watch while on diuretics.  Sodium 132 on presentation.  Check BMP in AM.  Acute on chronic hypoxic respiratory failure on 2 L of oxygen at baseline.  Likely secondary to CHF and COPD.  Currently needing 4 L of oxygen.  Continue with IV diuretics.  Closely monitor.  Wean oxygen as able.  Bilateral lower extremity venous stasis with increased erythema left leg  possible left leg cellulitis.  Patient received 1 dose of vancomycin in the ED.  Patient afebrile with no leukocytosis.  Will change to IV cefazolin.  Will benefit from wound care consultation for possible Unna boot.  Metastatic neuroendocrine pancreatic cancer  Renal cell cancer status post right nephrectomy Seen by Dr. Mosetta Putt oncology as outpatient.  Was on Sandostatin injections, bevacizumab infusion as  outpatient.  Will add oncology to the treatment team.  History of permanent atrial fibrillation status post cardioversion and AICD in the past.   Rate controlled at this time.  Has failed multiple cardioversions and amiodarone Tikosyn and flecainide in the past.  Seen by cardiology, heart failure team.    CKD stage IIIb.  Looks like at baseline.  Creatinine levels ranging from 1.5-1.8 at baseline.  Closely monitor while on diuretics.  Check BMP in AM.  History of iron deficiency anemia, blood loss anemia from bleed from AVM and tumor in the past.  Hemoglobin on presentation at 7.5 from hemoglobin of 7.9, two weeks back.  No need for blood transfusion at this time but might benefit from iron transfusion.  Generalized weakness, debility, deconditioning.  Will get PT OT evaluation.  Might need rehabilitation/home health on discharge.  DVT Prophylaxis: Heparin subcu  Review of Systems:  All systems were reviewed and were negative unless otherwise mentioned in the HPI   Past Medical History:  Diagnosis Date   (HFpEF) heart failure with preserved ejection fraction (HCC)    EF=60-60%   Actinic keratosis 01/17/2015   R forearm   Adjustment disorder with anxiety    Adverse effect of other narcotics, sequela    Intolerance to all narcotics   Anti-Duffy antibodies present    Aortic valve stenosis    Arthritis    "some in my hands" (11/11/2012)   Atrial fibrillation, permanent (HCC)    Eliquis   Atypical mole 03/25/2018   L forearm - severe   Automatic implantable cardioverter-defibrillator in situ    Avascular necrosis of hip (HCC) 05/03/2011   Carotid artery stenosis 09/2007   a. 09/2007: 60-79% bilateral (stable); b. 10/2008: 40-59% R 60-79%    Cellulitis of left lower extremity 07/13/2020   CKD (chronic kidney disease), stage III (HCC)    COPD (chronic obstructive pulmonary disease) (HCC)    Coronary artery disease    non-obstructive by 2006 cath   COVID-19 virus infection 02/12/2022    Displaced fracture of left femoral neck (HCC) 10/09/2018   GI bleed 03/28/2020   AVM   High cholesterol    Hypertension 05/20/2011   Hypertr obst cardiomyop    Hypotension, unspecified    cardiac cath 2006..nonobstructive CAD 30-40s lesions.Marland KitchenETT 1/09 nondiagnostic due to poor HR response..Right Renal Cancer 2003   Iron deficiency anemia    Long term (current) use of anticoagulants    On home oxygen therapy    3L [place]   Osteoarthritis of right hip    PAD (peripheral artery disease) (HCC)    Pancreatic cancer (HCC)    sandostatin   PONV (postoperative nausea and vomiting)    Presence of permanent cardiac pacemaker    Pulmonary HTN (HCC)    RECTAL BLEEDING 10/13/2009   Qualifier: Diagnosis of  By: Wilmon Pali NP, Paula     Red blood cell antibody positive with compatible PRBC difficult to obtain    Anti FYA (Duffy a) antibody. Must be transfused with PRB which are Duffy Antigen Negative and Crossmatch Compatible   Renal cell carcinoma (HCC)    s/p  nephrectomy   Squamous cell carcinoma of skin 01/17/2015   R lat wrist   Squamous cell carcinoma of skin 01/29/2018   R post upper leg - superficially invasive   Squamous cell carcinoma of skin 03/25/2018   L lat foot   Squamous cell carcinoma of skin 11/09/2020   left lat foot - EDC 01/01/21, recurrent 01/31/21 - MOHs 02/22/21   Squamous cell carcinoma of skin 12/19/2021   Right Posterior Medial Thigh, EDC   Squamous cell carcinoma of skin 12/19/2021   SCC IS, L lat heel, EDC 02/04/2022   Squamous cell carcinoma of skin 01/21/2022   L forearm, EDC 02/04/2022   Squamous cell carcinoma of skin 01/21/2022   R lower leg below knee, EDC 02/04/2022   Squamous cell carcinoma of skin 01/21/2022   SCCIS, R post heel, EDC 02/04/2022   Squamous cell carcinoma of skin 07/01/2022   left lower abdomen, in situ, EDC   Squamous cell carcinoma of skin 07/01/2022   left medial chest, EDC   Urge incontinence    Venous stasis of both lower  extremities    Past Surgical History:  Procedure Laterality Date   ABDOMINAL AORTOGRAM W/LOWER EXTREMITY N/A 02/12/2021   Procedure: ABDOMINAL AORTOGRAM W/LOWER EXTREMITY;  Surgeon: Maeola Harman, MD;  Location: [facility] INVASIVE CV LAB;  Service: Cardiovascular;  Laterality: N/A;   ABDOMINAL HYSTERECTOMY  1975   for benign causes   APPENDECTOMY     BI-VENTRICULAR PACEMAKER UPGRADE  05/04/2010   BIOPSY  02/28/2019   Procedure: BIOPSY;  Surgeon: Rachael Fee, MD;  Location: [facility] ENDOSCOPY;  Service: Endoscopy;;   BIOPSY  03/04/2022   Procedure: BIOPSY;  Surgeon: Lemar Lofty., MD;  Location: [facility] ENDOSCOPY;  Service: Gastroenterology;;   CARDIAC CATHETERIZATION  2006   CARDIOVERSION N/A 02/20/2018   Procedure: CARDIOVERSION;  Surgeon: Antonieta Iba, MD;  Location: [facility] ORS;  Service: Cardiovascular;  Laterality: N/A;   CARDIOVERSION N/A 03/27/2018   Procedure: CARDIOVERSION;  Surgeon: Antonieta Iba, MD;  Location: [facility] ORS;  Service: Cardiovascular;  Laterality: N/A;   CATARACT EXTRACTION W/ INTRAOCULAR LENS  IMPLANT, BILATERAL  01/2006-02-2006   CHOLECYSTECTOMY N/A 11/11/2012   Procedure: LAPAROSCOPIC CHOLECYSTECTOMY WITH INTRAOPERATIVE CHOLANGIOGRAM;  Surgeon: Wilmon Arms. Corliss Skains, MD;  Location: [facility] OR;  Service: General;  Laterality: N/A;   COLONOSCOPY WITH PROPOFOL N/A 02/28/2019   Procedure: COLONOSCOPY WITH PROPOFOL;  Surgeon: Rachael Fee, MD;  Location: [facility] ENDOSCOPY;  Service: Endoscopy;  Laterality: N/A;   ENTEROSCOPY N/A 03/30/2020   Procedure: ENTEROSCOPY;  Surgeon: Shellia Cleverly, DO;  Location: [facility] ENDOSCOPY;  Service: Gastroenterology;  Laterality: N/A;   ENTEROSCOPY N/A 02/10/2021   Procedure: ENTEROSCOPY;  Surgeon: Jeani Hawking, MD;  Location: [facility] ENDOSCOPY;  Service: Endoscopy;  Laterality: N/A;   ENTEROSCOPY N/A 02/15/2022   Procedure: ENTEROSCOPY;  Surgeon: Imogene Burn, MD;  Location: [facility] ENDOSCOPY;  Service: Gastroenterology;  Laterality: N/A;    ENTEROSCOPY N/A 11/06/2022   Procedure: ENTEROSCOPY;  Surgeon: Meridee Score Netty Starring., MD;  Location: [facility] ENDOSCOPY;  Service: Gastroenterology;  Laterality: N/A;   EP IMPLANTABLE DEVICE N/A 02/21/2016   Procedure: ICD Generator Changeout;  Surgeon: Duke Salvia, MD;  Location: [facility] INVASIVE CV LAB;  Service: Cardiovascular;  Laterality: N/A;   ESOPHAGOGASTRODUODENOSCOPY N/A 03/04/2022   Procedure: ESOPHAGOGASTRODUODENOSCOPY (EGD);  Surgeon: Lemar Lofty., MD;  Location: [facility] ENDOSCOPY;  Service: Gastroenterology;  Laterality: N/A;   ESOPHAGOGASTRODUODENOSCOPY (EGD) WITH PROPOFOL N/A 02/28/2019   Procedure: ESOPHAGOGASTRODUODENOSCOPY (EGD) WITH PROPOFOL;  Surgeon: Rob Bunting  P, MD;  Location: [facility] ENDOSCOPY;  Service: Endoscopy;  Laterality: N/A;   EUS N/A 03/04/2022   Procedure: UPPER ENDOSCOPIC ULTRASOUND (EUS) RADIAL;  Surgeon: Lemar Lofty., MD;  Location: [facility] ENDOSCOPY;  Service: Gastroenterology;  Laterality: N/A;   FINE NEEDLE ASPIRATION N/A 03/04/2022   Procedure: FINE NEEDLE ASPIRATION (FNA) LINEAR;  Surgeon: Lemar Lofty., MD;  Location: [facility] ENDOSCOPY;  Service: Gastroenterology;  Laterality: N/A;   GIVENS CAPSULE STUDY N/A 03/15/2019   Procedure: GIVENS CAPSULE STUDY;  Surgeon: Tressia Danas, MD;  Location: [facility] ENDOSCOPY;  Service: Gastroenterology;  Laterality: N/A;   HOT HEMOSTASIS N/A 03/30/2020   Procedure: HOT HEMOSTASIS (ARGON PLASMA COAGULATION/BICAP);  Surgeon: Shellia Cleverly, DO;  Location: [facility] ENDOSCOPY;  Service: Gastroenterology;  Laterality: N/A;   HOT HEMOSTASIS N/A 02/10/2021   Procedure: HOT HEMOSTASIS (ARGON PLASMA COAGULATION/BICAP);  Surgeon: Jeani Hawking, MD;  Location: [facility] ENDOSCOPY;  Service: Endoscopy;  Laterality: N/A;   HOT HEMOSTASIS N/A 02/15/2022   Procedure: HOT HEMOSTASIS (ARGON PLASMA COAGULATION/BICAP);  Surgeon: Imogene Burn, MD;  Location: [facility] ENDOSCOPY;  Service: Gastroenterology;  Laterality: N/A;   HOT HEMOSTASIS  N/A 11/06/2022   Procedure: HOT HEMOSTASIS (ARGON PLASMA COAGULATION/BICAP);  Surgeon: Lemar Lofty., MD;  Location: [facility] ENDOSCOPY;  Service: Gastroenterology;  Laterality: N/A;   INSERT / REPLACE / REMOVE PACEMAKER  05-01-11   02-28-05-/05-04-10-ICD-MEDTRONIC MAXIMAL DR   IR ANGIOGRAM SELECTIVE EACH ADDITIONAL VESSEL  02/18/2022   IR ANGIOGRAM SELECTIVE EACH ADDITIONAL VESSEL  02/18/2022   IR ANGIOGRAM VISCERAL SELECTIVE  02/16/2022   IR ANGIOGRAM VISCERAL SELECTIVE  02/16/2022   IR EMBO ART  VEN HEMORR LYMPH EXTRAV  INC GUIDE ROADMAPPING  02/16/2022   IR THORACENTESIS ASP PLEURAL SPACE W/IMG GUIDE  02/21/2022   IR THORACENTESIS ASP PLEURAL SPACE W/IMG GUIDE  02/22/2022   IR US GUIDE VASC ACCESS RIGHT  02/16/2022   JOINT REPLACEMENT     LAPAROSCOPIC CHOLECYSTECTOMY  11/11/2012   LAPAROSCOPIC LYSIS OF ADHESIONS N/A 11/11/2012   Procedure: LAPAROSCOPIC LYSIS OF ADHESIONS;  Surgeon: Wilmon Arms. Corliss Skains, MD;  Location: [facility] OR;  Service: General;  Laterality: N/A;   NEPHRECTOMY Right 06/2001    S/P RENAL CELL CANCER   PERIPHERAL VASCULAR INTERVENTION Bilateral 02/12/2021   Procedure: PERIPHERAL VASCULAR INTERVENTION;  Surgeon: Maeola Harman, MD;  Location: [facility] INVASIVE CV LAB;  Service: Cardiovascular;  Laterality: Bilateral;  Iliac artery stents   PRESSURE SENSOR/CARDIOMEMS N/A 02/03/2019   Procedure: PRESSURE SENSOR/CARDIOMEMS;  Surgeon: Laurey Morale, MD;  Location: [facility] INVASIVE CV LAB;  Service: Cardiovascular;  Laterality: N/A;   RIGHT HEART CATH N/A 11/09/2018   Procedure: RIGHT HEART CATH;  Surgeon: Laurey Morale, MD;  Location: [facility] INVASIVE CV LAB;  Service: Cardiovascular;  Laterality: N/A;   RIGHT HEART CATH N/A 03/08/2019   Procedure: RIGHT HEART CATH;  Surgeon: Laurey Morale, MD;  Location: [facility] INVASIVE CV LAB;  Service: Cardiovascular;  Laterality: N/A;   SUBMUCOSAL TATTOO INJECTION  02/28/2019   Procedure: SUBMUCOSAL TATTOO INJECTION;  Surgeon: Rachael Fee, MD;   Location: [facility] ENDOSCOPY;  Service: Endoscopy;;   SUBMUCOSAL TATTOO INJECTION  11/06/2022   Procedure: SUBMUCOSAL TATTOO INJECTION;  Surgeon: Lemar Lofty., MD;  Location: [facility] ENDOSCOPY;  Service: Gastroenterology;;   TOTAL HIP ARTHROPLASTY Right 05/03/2011   Procedure: TOTAL HIP ARTHROPLASTY ANTERIOR APPROACH;  Surgeon: Kathryne Hitch;  Location: [facility] ORS;  Service: Orthopedics;  Laterality: Right;  Removal of Cannulated Screws Right Hip, Right Direct Anterior Hip Replacement   TOTAL HIP ARTHROPLASTY  Left 10/09/2018   Procedure: TOTAL HIP ARTHROPLASTY ANTERIOR APPROACH;  Surgeon: Samson Frederic, MD;  Location: [facility] OR;  Service: Orthopedics;  Laterality: Left;    Social History:  reports that she quit smoking about 22 years ago. Her smoking use included cigarettes. She started smoking about 62 years ago. She has a 20 pack-year smoking history. She has never used smokeless tobacco. She reports that she does not drink alcohol and does not use drugs.  Allergies  Allergen Reactions   Codeine Nausea And Vomiting and Other (See Comments)    Hallucinations, too- CANNOT HAVE; thinks she may have received Narcan to reverse the effect   Dilaudid [Hydromorphone] Other (See Comments)    Excessive Somnolence- Required Narcan X2   Tape Other (See Comments)    CAN TOLERATE ONLY EASY-RELEASE, PAPER TAPE, AS THE SKIN IS VERY THIN AND WILL BRUISE AND TEAR VERY EASILY   Amoxil [Amoxicillin] Nausea Only   Asa [Aspirin] Other (See Comments)    Told to avoid due to reduced kidney function, has 1 remaining kidney   Ms Contin [Morphine] Nausea And Vomiting and Other (See Comments)    Hallucinations, also   Neurontin [Gabapentin] Other (See Comments)    "Out of it"   Nsaids Other (See Comments)    Told to avoid due to reduced kidney function, has 1 remaining kidney    Family History  Problem Relation Age of Onset   Heart failure Mother    Early death Father        car accident   Breast cancer  Maternal Aunt 55   Breast cancer Cousin    Breast cancer Other    Colon cancer Neg Hx    Esophageal cancer Neg Hx    Liver disease Neg Hx    Pancreatic cancer Neg Hx    Rectal cancer Neg Hx      Prior to Admission medications   Medication Sig Start Date End Date Taking? Authorizing Provider  acetaminophen (TYLENOL) 500 MG tablet Take 1,000 mg by mouth every 8 (eight) hours.   Yes [provider]  Ascorbic Acid (VITAMIN C PO) Take 1 tablet by mouth daily.   Yes [provider]  Cholecalciferol (VITAMIN D-3 PO) Take 2 capsules by mouth daily.   Yes [provider]  CRANBERRY PO Take 1 tablet by mouth daily.   Yes [provider]  Cyanocobalamin (B-12 PO) Take 1 tablet by mouth daily.   Yes [provider]  ferrous gluconate (FERGON) 324 MG tablet Take 1 tablet (324 mg total) by mouth daily with breakfast. 11/15/22  Yes Rai, Ripudeep K, MD  fluticasone (FLONASE) 50 MCG/ACT nasal spray Place 2 sprays into both nostrils daily as needed for allergies or rhinitis. 07/04/22  Yes [name], MD  metolazone (ZAROXOLYN) 2.5 MG tablet Take 1 tablet (2.5 mg total) by mouth 2 (two) times a week. Every Tuesday and  Saturday with 40 meq of potassium Patient taking differently: Take 2.5 mg by mouth See admin instructions. Take 2.5 mg by mouth on Tuesday and Saturday 04/17/23  Yes Lee, [place], NP  Multiple Vitamins-Minerals (HAIR SKIN NAILS PO) Take 1 capsule by mouth daily.   Yes [provider]  pantoprazole (PROTONIX) 40 MG tablet Take 1 tablet (40 mg total) by mouth daily. Patient taking differently: Take 40 mg by mouth daily before breakfast. 11/09/22  Yes Rai, Ripudeep K, MD  potassium chloride SA (KLOR-CON M) 20 MEQ tablet Take 2 tablets (40 mEq total) by mouth every  morning AND 1 tablet (20 mEq total) at bedtime. Take extra 2 tabs when you take metolazone. Patient taking differently: Take 20 mEq by mouth in the morning and evening and an  additional 20 mEq once daily on Tues/Sat only 03/07/23  Yes Bensimhon, Bevelyn Buckles, MD  torsemide (DEMADEX) 20 MG tablet Take 5 tablets (100 mg total) by mouth 2 (two) times daily. 01/18/23  Yes [place], Eric J, DO  traMADol (ULTRAM) 50 MG tablet Take 50 mg by mouth every 6 (six) hours as needed (for pain).   Yes [provider]  doxycycline (VIBRAMYCIN) 100 MG capsule Take 1 capsule (100 mg total) by mouth 2 (two) times daily. Patient not taking: Reported on 05/27/2023 04/25/23   Carlean Jews, NP  potassium chloride SA (KLOR-CON M) 20 MEQ tablet Take 1 tablet (20 mEq total) by mouth once a week. Weekly with Metolazone dose every Saturday Patient not taking: Reported on 05/27/2023 03/18/23   Alen Bleacher, NP  rosuvastatin (CRESTOR) 40 MG tablet Take 1 tablet (40 mg total) by mouth daily. Patient not taking: Reported on 05/27/2023 05/02/22   [name], MD    Physical Exam:  Vitals:   05/27/23 1630 05/27/23 1730 05/27/23 1800 05/27/23 1900  BP: (!) 157/130 (!) 131/37 (!) 136/52 (!) 138/44  Pulse: 80 68 (!) 59 65  Resp: (!) 24 (!) 21 19 (!) 23  Temp:      SpO2: 90% 95% 90% 93%   Wt Readings from Last 3 Encounters:  05/14/23 64.1 kg  04/15/23 62.4 kg  03/26/23 60.8 kg   There is no height or weight on file to calculate BMI.  General: Alert awake and Communicative, not in obvious distress elderly frail appearing chronically ill, on nasal cannula oxygen HENT: Normocephalic, No scleral pallor or icterus noted. Oral mucosa is moist.  Chest: Diminished breath sounds bilaterally, coarse breath sounds noted, chest wall AICD in place CVS: S1 &S2 heard.  Abdomen: Soft, nontender, nondistended.  Bowel sounds are heard. No abdominal mass palpated Extremities: No cyanosis, clubbing with bilateral lower extremity edema, chronic venous insufficiency changes with scabbing erythema, edema and tenderness on palpation.   Psych: Alert, awake and oriented, normal mood CNS:  No cranial nerve  deficits.  Generalized weakness noted.  Moves all extremities Skin: Warm and dry.  Bilateral lower extremity erythema edema scabbing from chronic venous insufficiency  Labs on Admission:   CBC: Recent Labs  Lab 05/27/23 1723  WBC 6.7  NEUTROABS 5.7  HGB 7.5*  HCT 26.4*  MCV 83.8  PLT 251    Basic Metabolic Panel: Recent Labs  Lab 05/27/23 1723  NA 132*  K 3.6  CL 98  CO2 23  GLUCOSE 118*  BUN 62*  CREATININE 1.62*  CALCIUM 8.8*    Liver Function Tests: Recent Labs  Lab 05/27/23 1723  AST 26  ALT 12  ALKPHOS 66  BILITOT 1.2  PROT 7.5  ALBUMIN 3.3*   No results for input(s): "LIPASE", "AMYLASE" in the last 168 hours. No results for input(s): "AMMONIA" in the last 168 hours.  Cardiac Enzymes: No results for input(s): "CKTOTAL", "CKMB", "CKMBINDEX", "TROPONINI" in the last 168 hours.  BNP (last 3 results) Recent Labs    03/26/23 1315 04/15/23 1252 05/27/23 1734  BNP 268.9* 359.6* 416.4*    ProBNP (last 3 results) No results for input(s): "PROBNP" in the last 8760 hours.  CBG: No results for input(s): "GLUCAP" in the last 168 hours.  Lipase     Component  Value Date/Time   LIPASE 24.0 09/01/2012 1215     Urinalysis    Component Value Date/Time   COLORURINE YELLOW 08/21/2022 1300   APPEARANCEUR CLEAR 08/21/2022 1300   APPEARANCEUR Cloudy (A) 01/27/2018 1330   LABSPEC 1.016 08/21/2022 1300   PHURINE 5.0 08/21/2022 1300   GLUCOSEU NEGATIVE 08/21/2022 1300   HGBUR NEGATIVE 08/21/2022 1300   HGBUR large 11/16/2009 1202   BILIRUBINUR NEGATIVE 08/21/2022 1300   BILIRUBINUR Negative 11/08/2021 1237   BILIRUBINUR Negative 01/27/2018 1330   KETONESUR NEGATIVE 08/21/2022 1300   PROTEINUR NEGATIVE 05/14/2023 0957   UROBILINOGEN 0.2 11/08/2021 1237   UROBILINOGEN 1.0 08/16/2014 1754   NITRITE NEGATIVE 08/21/2022 1300   LEUKOCYTESUR MODERATE (A) 08/21/2022 1300     Drugs of Abuse  No results found for: "LABOPIA", "COCAINSCRNUR", "LABBENZ",  "AMPHETMU", "THCU", "LABBARB"    Radiological Exams on Admission: DG Chest Portable 1 View Result Date: 05/27/2023 CLINICAL DATA:  Shortness of breath EXAM: PORTABLE CHEST 1 VIEW COMPARISON:  01/30/2023 FINDINGS: Left-sided pacing device as before. Cardiomegaly with similar sized pleural effusions. Mild vascular congestion. Aortic atherosclerosis. No pneumothorax IMPRESSION: Cardiomegaly with vascular congestion and similar sized pleural effusions. Overall no significant change compared to prior Electronically Signed   By: Jasmine Pang M.D.   On: 05/27/2023 16:17    EKG: Personally reviewed by me which shows no evidence of ischemia.   Consultant: Cardiology  Code Status: DNR  Microbiology none  Antibiotics: Cefazolin IV  Family Communication:  Patients' condition and plan of care including tests being ordered have been discussed with the patient and the patient's niece at bedside who indicate understanding and agree with the plan.   Status is: Inpatient   Severity of Illness: The appropriate patient status for this patient is INPATIENT. Inpatient status is judged to be reasonable and necessary in order to provide the required intensity of service to ensure the patient's safety. The patient's presenting symptoms, physical exam findings, and initial radiographic and laboratory data in the context of their chronic comorbidities is felt to place them at high risk for further clinical deterioration. Furthermore, it is not anticipated that the patient will be medically stable for discharge from the hospital within 2 midnights of admission.   * I certify that at the point of admission it is my clinical judgment that the patient will require inpatient hospital care spanning beyond 2 midnights from the point of admission due to high intensity of service, high risk for further deterioration and high frequency of surveillance required.*  Signed, Joycelyn Das, MD [facility] 05/27/2023

## 2023-05-27 NOTE — Progress Notes (Signed)
PHARMACY NOTE:  ANTIMICROBIAL RENAL DOSAGE ADJUSTMENT  Current antimicrobial regimen includes a mismatch between antimicrobial dosage and estimated renal function.  As per policy approved by the Pharmacy & Therapeutics and Medical Executive Committees, the antimicrobial dosage will be adjusted accordingly.  Current antimicrobial dosage:   Ancef 1 g IV q8h  Indication: cellulitis  Renal Function:  Estimated Creatinine Clearance: 22.9 mL/min (A) (by C-G formula based on SCr of 1.62 mg/dL (H)). []      On intermittent HD, scheduled: []      On CRRT    Antimicrobial dosage has been changed to:   Ancef 1 g IV q12h  Additional comments:   Thank you for allowing pharmacy to be a part of this patient's care.  Lynden Ang, [facility] 05/27/2023 7:53 PM

## 2023-05-27 NOTE — Hospital Course (Addendum)
Patient is 89 years old female with past medical history of diastolic heart failure, pulmonary hypertension, hypertrophic cardiomyopathy status post AICD, history of GI bleed, history of neuroendocrine pancreatic cancer, chronic respiratory failure on 2 L of oxygen at baseline, history of COPD presented to the hospital with complaints of shortness of breath fatigue for the last week and a half.  At baseline patient uses 2 L of oxygen but has been requiring more oxygen.  Patient reported worsening lower extremity edema with generalized malaise fatigue.  Patient stated that she had been using some wraps for the lower extremities and had doxycycline recently.  Scabs have improved but still has some redness and swelling of the lower extremities.  She however denied any fever, chills or rigor.  Denies any chest pain, sputum production.  Denies any urinary urgency, frequency or dysuria.  Denies any sick contacts or recent travel.  In the ED, patient was noted to have blood pressure/elevated at 148/61 with a respiratory rate 24.  Patient was hypoxic in the 83s in the ED at 2 L and had to bump up to 4 L in the ED for pulse ox of 100%.  Labs were notable for hemoglobin of 7.5, WBC at 6.7.  Sodium slightly low at 132 with creatinine elevation at 1.6.  BNP was elevated at 416.  Chest x-ray showed vascular congestion.  EKG did not show any ischemic changes, intraventricular conduction delay..  Troponin I at 39.  Patient was given 1 dose of vancomycin in the ED 1 dose of IV Lasix and was considered for admission to hospital for further evaluation and treatment.  Acute on chronic diastolic CHF exacerbation.   History of hypertrophic cardiomyopathy status post AICD. Will admit the patient in telemetry unit. Monitor for arrythmias. Continue  IV Lasix.,  fluid restriction, strict intake and output charting.  Daily weights.  Follow CHF protocol.  Trend troponins, first set at 39.  No chest pain..  We will closely monitor renal  function on diuretics.  2 D Echocardiogram from 01/2023 with last known ejection fraction of 65 to 70% with no regional wall motion abnormality.  Will follow CHF protocol.  Cardiology Dr. Ronelle Nigh had seen the patient December 2024 as outpatient and at that time patient was advised torsemide 100 mg twice daily with metolazone 2.5 mg twice a day and transition to palliative approach in the future if not improved.  Continue with IV Lasix twice daily, metolazone.  Mild hyponatremia.  Will closely watch while on diuretics.  Acute on chronic hypoxic respiratory failure on 2 L of oxygen at baseline.  Likely secondary to CHF and COPD.  Currently needing 4 L of oxygen.  Continue with IV diuretics.  Bilateral lower extremity venous stasis with increased erythema left leg possible left leg cellulitis.  Patient received 1 dose of vancomycin in the ED.  Patient afebrile with no leukocytosis.  Metastatic neuroendocrine pancreatic cancer  Seen by Dr. Mosetta Putt oncology as outpatient.  Was on Sandostatin injections q. 28 days as outpatient.  On bevacizumab infusion as outpatient.  Renal cell cancer status post right nephrectomy  History of permanent atrial fibrillation status post cardioversion and AICD in the past.  Rate controlled at this time.  Has failed multiple cardioversions and amiodarone Tikosyn and flecainide in the past.  Seen by cardiology as outpatient.  CKD stage IIIb.  Looks like at baseline.  Creatinine levels ranging from 1.5-1.8 at baseline.  Closely monitor while on diuretics.  Hyperlipidemia continue Crestor.  History of iron  deficiency anemia, blood loss anemia from bleed from AVM and tumor in the past.  Hemoglobin on presentation at 7.5 from hemoglobin of 7.9, two weeks back.  Used to receive iron infusion in the past.  Could consider iron infusion prior to discharge.  Generalized weakness, debility, deconditioning.  Will get PT OT evaluation.

## 2023-05-27 NOTE — ED Triage Notes (Signed)
[facility] labored breathing, shortness of breath, dizziness, lightheadedness and nausea. Patient also has bilateral weeping of her legs. Her last treatment of Bevacizumab was 1/8. The patient also received iron. She is currently on 4L of O 2 but usually is on 2L at home.   HX: pancreatic cancer, anemia   Vitals: 22 RR 131/98 BP

## 2023-05-27 NOTE — ED Provider Notes (Signed)
[facility] EMERGENCY DEPARTMENT AT [facility] Provider Note   CSN: [id number] Arrival date & time: 05/27/23  1520     History {Add pertinent medical, surgical, social history, OB history to HPI:1} Chief Complaint  Patient presents with   Shortness of Breath    [name] is a 89 y.o. female.  This is an 89 year old female presenting the emergency department generalized malaise, extreme fatigue, worsening shortness of breath, lightheadedness.  Symptoms been worse for the past week and a half.  She is oxygen dependent on 2 L, but is having to use more oxygen the past few days.  Also notes worsening lower extremity edema.  No fevers no chills.  No chest pain.   Shortness of Breath      Home Medications Prior to Admission medications   Medication Sig Start Date End Date Taking? Authorizing Provider  acetaminophen (TYLENOL) 500 MG tablet Take 1,000 mg by mouth as needed.    [provider]  Ascorbic Acid (VITAMIN C PO) Take 1 tablet by mouth daily.    [provider]  Cholecalciferol (VITAMIN D-3 PO) Take 2 capsules by mouth daily.    [provider]  Cyanocobalamin (B-12 PO) Take 1 tablet by mouth daily.    [provider]  doxycycline (VIBRAMYCIN) 100 MG capsule Take 1 capsule (100 mg total) by mouth 2 (two) times daily. Patient not taking: Reported on 05/19/2023 04/25/23   Carlean Jews, NP  ferrous gluconate (FERGON) 324 MG tablet Take 1 tablet (324 mg total) by mouth daily with breakfast. 11/15/22   Rai, Delene Ruffini, MD  fluticasone (FLONASE) 50 MCG/ACT nasal spray Place 2 sprays into both nostrils daily as needed for allergies or rhinitis. 07/04/22   [name], MD  metolazone (ZAROXOLYN) 2.5 MG tablet Take 1 tablet (2.5 mg total) by mouth 2 (two) times a week. Every Tuesday and  Saturday with 40 meq of potassium 04/17/23   Lee, [place], NP  Multiple Vitamins-Minerals (HAIR SKIN NAILS PO) Take 1 capsule by mouth daily.     [provider]  pantoprazole (PROTONIX) 40 MG tablet Take 1 tablet (40 mg total) by mouth daily. 11/09/22   Rai, Ripudeep K, MD  potassium chloride SA (KLOR-CON M) 20 MEQ tablet Take 2 tablets (40 mEq total) by mouth every morning AND 1 tablet (20 mEq total) at bedtime. Take extra 2 tabs when you take metolazone. 03/07/23   Bensimhon, Bevelyn Buckles, MD  potassium chloride SA (KLOR-CON M) 20 MEQ tablet Take 1 tablet (20 mEq total) by mouth once a week. Weekly with Metolazone dose every Saturday 03/18/23   Alen Bleacher, NP  rosuvastatin (CRESTOR) 40 MG tablet Take 1 tablet (40 mg total) by mouth daily. 05/02/22   [name], MD  torsemide (DEMADEX) 20 MG tablet Take 5 tablets (100 mg total) by mouth 2 (two) times daily. 01/18/23   [place], Alvira Philips, DO  traMADol (ULTRAM) 50 MG tablet Take 50 mg by mouth every 6 (six) hours as needed.    [provider]      Allergies    Codeine, Amoxil [amoxicillin], Asa [aspirin], Ms contin [morphine], Neurontin [gabapentin], and Nsaids    Review of Systems   Review of Systems  Respiratory:  Positive for shortness of breath.     Physical Exam Updated Vital Signs BP (!) 148/61   Pulse 69   Temp (!) 97.5 F (36.4 C)   Resp (!) 22   SpO2 100%  Physical Exam Vitals  and nursing note reviewed.  Constitutional:      General: She is not in acute distress.    Appearance: She is not toxic-appearing.  HENT:     Head: Normocephalic.  Cardiovascular:     Rate and Rhythm: Normal rate and regular rhythm.  Pulmonary:     Effort: Pulmonary effort is normal.     Comments: Coarse breath sounds throughout all lung fields. Musculoskeletal:     Cervical back: Normal range of motion.     Right lower leg: Edema present.     Left lower leg: Edema present.     Comments: Has weeping lower extremity edema with chronic derma stasis.  Right lower extremity does have increased erythema as compared to the left.   Skin:    Capillary Refill: Capillary  refill takes less than 2 seconds.  Neurological:     General: No focal deficit present.     Mental Status: She is alert.  Psychiatric:        Mood and Affect: Mood normal.        Behavior: Behavior normal.     ED Results / Procedures / Treatments   Labs (all labs ordered are listed, but only abnormal results are displayed) Labs Reviewed  CBC WITH DIFFERENTIAL/PLATELET  COMPREHENSIVE METABOLIC PANEL  BRAIN NATRIURETIC PEPTIDE  TROPONIN I (HIGH SENSITIVITY)    EKG None  Radiology No results found.  Procedures Procedures  {Document cardiac monitor, telemetry assessment procedure when appropriate:1}  Medications Ordered in ED Medications - No data to display  ED Course/ Medical Decision Making/ A&P Clinical Course as of 05/27/23 1611  Tue May 27, 2023  1606 Echo 01/16/23: "IMPRESSIONS     1. Left ventricular ejection fraction, by estimation, is 65 to 70%. Left  ventricular ejection fraction by PLAX is 60 %. The left ventricle has  normal function. The left ventricle has no regional wall motion  abnormalities. There is mild concentric left  ventricular hypertrophy. Left ventricular diastolic parameters are  indeterminate.   2. D-shaped interventricular septum suggests RV pressure/volume overload.  Right ventricular systolic function is mildly reduced. The right  ventricular size is mildly enlarged. There is severely elevated pulmonary  artery systolic pressure. The estimated  right ventricular systolic pressure is 103.0 mmHg.   3. Left atrial size was moderately dilated.   4. Right atrial size was severely dilated.   5. The mitral valve is degenerative. Mild mitral valve regurgitation.  Moderate mitral stenosis. The mean mitral valve gradient is 6.0 mmHg with  average heart rate of 65 bpm, MVA 1.52 cm^2 by VTI. Moderate mitral  annular calcification.   6. The tricuspid valve is abnormal. Tricuspid valve regurgitation is  severe.   7. The aortic valve is tricuspid.  There is moderate calcification of the  aortic valve. Aortic valve regurgitation is mild. Moderate aortic valve  stenosis. Aortic valve area, by VTI measures 1.10 cm. Aortic valve mean  gradient measures 17.0 mmHg. Aortic   valve Vmax measures 2.68 m/s.   8. The inferior vena cava is dilated in size with <50% respiratory  variability, suggesting right atrial pressure of 15 mmHg.   9. The patient is in atrial fibrillation " [TY]    Clinical Course User Index [TY] Coral Spikes, DO   {   Click here for ABCD2, HEART and other calculatorsREFRESH Note before signing :1}  Medical Decision Making This is an 89 year old female with complex past medical history to include pancreatic cancer, heart failure, chronic respiratory failure on 2 L of oxygen who presented to the emergency department for shortness of breath and failure to thrive type picture.  Patient's niece notes that patient has a history of symptomatic anemia requiring transfusion.  Concern for cellulitis to right lower extremity on exam.  Will start antibiotics.  Will get broad workup.  Patient is requiring 4 L of oxygen gas compared to her 2 L.  She will likely need admission.  See ED course for workup and final MDM/disposition.  Confirmed with patient and niece that patient is a DNR.  Amount and/or Complexity of Data Reviewed Labs: ordered. Radiology: ordered. ECG/medicine tests: ordered.  Risk Decision regarding hospitalization.    {Document critical care time when appropriate:1} {Document review of labs and clinical decision tools ie heart score, Chads2Vasc2 etc:1}  {Document your independent review of radiology images, and any outside records:1} {Document your discussion with family members, caretakers, and with consultants:1} {Document social determinants of health affecting pt's care:1} {Document your decision making why or why not admission, treatments were needed:1} Final Clinical  Impression(s) / ED Diagnoses Final diagnoses:  None    Rx / DC Orders ED Discharge Orders     None

## 2023-05-27 NOTE — Patient Instructions (Signed)
Visit Information  Thank you for taking time to visit with me today. Please don't hesitate to contact me if I can be of assistance to you.   Following are the goals we discussed today:   Goals Addressed             This Visit's Progress    Education and management of health conditions       Interventions Today    Flowsheet Row Most Recent Value  Chronic Disease   Chronic disease during today's visit Congestive Heart Failure (CHF), Other  [left foot skin cancer, anemia due to pancreatic tumor and AVM, Bilateral leg weeping/ lesions]  General Interventions   General Interventions Discussed/Reviewed General Interventions Discussed, Doctor Visits  [evaluation of current treatment plan related to listed health conditions and patients adherence to plan as established by provider.  Assessed for HF symptoms and new/ ongoing symptoms.]  Doctor Visits Discussed/Reviewed Doctor Visits Discussed  [facility] upcoming provider visits.  Advised to keep follow up visit with providers as recommended.]  Education Interventions   Education Provided Provided Education  [reviewed signs and symptoms of infection. Advised to notify provider if infection symptoms noticed.  Advised to elevate legs when sitting and or laying down. Confirmed patient has assistance in the home with care.]  Pharmacy Interventions   Pharmacy Dicussed/Reviewed Pharmacy Topics Discussed  [medications reviewed and compliance discussed and advised., ADvised to take antibiotic until completed.]  Safety Interventions   Safety Discussed/Reviewed Safety Discussed, Fall Risk  [Safety concerns related to postual dizziness.  Fall precautions discussed. Advised to use ambulatory devices as recommended by provider.]              Our next appointment is by telephone on 06/26/23 at 3 pm  Please call the care guide team at [phone] if you need to cancel or reschedule your appointment.   If you are experiencing a Mental Health or  Behavioral Health Crisis or need someone to talk to, please call the Suicide and Crisis Lifeline: 988 call 1-800-273-TALK (toll free, 24 hour hotline)  Patient verbalizes understanding of instructions and care plan provided today and agrees to view in MyChart. Active MyChart status and patient understanding of how to access instructions and care plan via MyChart confirmed with patient.     George Ina RN, BSN, CCM [employer], Population Health Case Manager Phone: [phone]

## 2023-05-28 ENCOUNTER — Telehealth (HOSPITAL_COMMUNITY): Payer: Self-pay | Admitting: Cardiology

## 2023-05-28 ENCOUNTER — Other Ambulatory Visit: Payer: PPO

## 2023-05-28 ENCOUNTER — Inpatient Hospital Stay: Payer: PPO | Admitting: Nurse Practitioner

## 2023-05-28 ENCOUNTER — Inpatient Hospital Stay (HOSPITAL_COMMUNITY): Payer: PPO

## 2023-05-28 ENCOUNTER — Inpatient Hospital Stay: Payer: PPO

## 2023-05-28 DIAGNOSIS — J9621 Acute and chronic respiratory failure with hypoxia: Secondary | ICD-10-CM | POA: Insufficient documentation

## 2023-05-28 DIAGNOSIS — I5023 Acute on chronic systolic (congestive) heart failure: Secondary | ICD-10-CM

## 2023-05-28 DIAGNOSIS — E871 Hypo-osmolality and hyponatremia: Secondary | ICD-10-CM | POA: Insufficient documentation

## 2023-05-28 DIAGNOSIS — I5033 Acute on chronic diastolic (congestive) heart failure: Secondary | ICD-10-CM | POA: Diagnosis not present

## 2023-05-28 LAB — CBC
HCT: 25.9 % — ABNORMAL LOW (ref 36.0–46.0)
Hemoglobin: 7.4 g/dL — ABNORMAL LOW (ref 12.0–15.0)
MCH: 23.8 pg — ABNORMAL LOW (ref 26.0–34.0)
MCHC: 28.6 g/dL — ABNORMAL LOW (ref 30.0–36.0)
MCV: 83.3 fL (ref 80.0–100.0)
Platelets: 265 10*3/uL (ref 150–400)
RBC: 3.11 MIL/uL — ABNORMAL LOW (ref 3.87–5.11)
RDW: 20.4 % — ABNORMAL HIGH (ref 11.5–15.5)
WBC: 6.4 10*3/uL (ref 4.0–10.5)
nRBC: 0.5 % — ABNORMAL HIGH (ref 0.0–0.2)

## 2023-05-28 LAB — BASIC METABOLIC PANEL
Anion gap: 8 (ref 5–15)
BUN: 66 mg/dL — ABNORMAL HIGH (ref 8–23)
CO2: 26 mmol/L (ref 22–32)
Calcium: 8.7 mg/dL — ABNORMAL LOW (ref 8.9–10.3)
Chloride: 98 mmol/L (ref 98–111)
Creatinine, Ser: 1.58 mg/dL — ABNORMAL HIGH (ref 0.44–1.00)
GFR, Estimated: 31 mL/min — ABNORMAL LOW (ref >60)
Glucose, Bld: 172 mg/dL — ABNORMAL HIGH (ref 70–99)
Potassium: 3.6 mmol/L (ref 3.5–5.1)
Sodium: 132 mmol/L — ABNORMAL LOW (ref 135–145)

## 2023-05-28 LAB — ECHOCARDIOGRAM COMPLETE
AR max vel: 1.22 cm2
AV Area VTI: 1.23 cm2
AV Area mean vel: 1.2 cm2
AV Mean grad: 17 mm[Hg]
AV Peak grad: 28.2 mm[Hg]
Ao pk vel: 2.66 m/s
Area-P 1/2: 4.31 cm2
Calc EF: 72.6 %
Height: 66 in
MV VTI: 1.24 cm2
P 1/2 time: 441 ms
S' Lateral: 2.4 cm
Single Plane A2C EF: 78.7 %
Single Plane A4C EF: 65.6 %
Weight: 2239.87 [oz_av]

## 2023-05-28 LAB — MAGNESIUM: Magnesium: 2.1 mg/dL (ref 1.7–2.4)

## 2023-05-28 LAB — PHOSPHORUS: Phosphorus: 3.8 mg/dL (ref 2.5–4.6)

## 2023-05-28 MED ORDER — HYDROCERIN EX CREA
TOPICAL_CREAM | CUTANEOUS | Status: DC
  Filled 2023-05-28: qty 113

## 2023-05-28 MED ORDER — CEPHALEXIN 500 MG PO CAPS
500.0000 mg | ORAL_CAPSULE | Freq: Three times a day (TID) | ORAL | Status: AC
  Administered 2023-05-28 – 2023-06-01 (×12): 500 mg via ORAL
  Filled 2023-05-28 (×12): qty 1

## 2023-05-28 NOTE — Assessment & Plan Note (Signed)
Creatinine stable relative to baseline 1.5

## 2023-05-28 NOTE — Assessment & Plan Note (Signed)
Not on anticoagulation, rate controlled

## 2023-05-28 NOTE — Progress Notes (Signed)
2D echo attempted. Consult in room. Will try later

## 2023-05-28 NOTE — Plan of Care (Signed)
  Problem: Education: Goal: Knowledge of General Education information will improve Description: Including pain rating scale, medication(s)/side effects and non-pharmacologic comfort measures Outcome: Progressing   Problem: Activity: Goal: Risk for activity intolerance will decrease Outcome: Progressing   Problem: Nutrition: Goal: Adequate nutrition will be maintained Outcome: Progressing   Problem: Coping: Goal: Level of anxiety will decrease Outcome: Progressing   

## 2023-05-28 NOTE — Evaluation (Addendum)
Occupational Therapy Evaluation Patient Details Name: [name] MRN: [id number] DOB: [birth year] Today's Date: 05/28/2023   History of Present Illness 89 y.o. female admitted on 05/27/2023    89 year old with diastolic heart failure, pulmonary hypertension, status post AICD, history of GI bleed, history of neuroendocrine pancreatic cancer, chronic resp failure was on 2 L O2 [place], has COPD as well. Patient admitted with worsening LE edema, generalized malaise and fatigue. Patient has been admitted with acute on chronic diastolic CHF exacerbation   Clinical Impression   Pt presents with decline in function and safety with ADLs and ADL mobility with impaired strength, balance and endurance; pt limited by poor activity tolerance and currently on 4L O2. PTA  pt lives with her great grandson and was  Mod I with bathing, dressing, toileting; family assist with transportation, pt still cooks some and using broom stick when ambulating? On 2L O2 at baseline at home. Great grandson works 24 hours shifts but is off several days in a row and she has other family that checks in also. Pt currently requires min A to sit EOB, mod A with LB ADLs and min A with mobility/transfers using RW. Pt requires increased time and multiple rest breaks to complete tasks, O2 SATs remained stable. Pt would benefit from acute OT services to address impairments to maximize level of function and safety      If plan is discharge home, recommend the following: A lot of help with bathing/dressing/bathroom;A little help with walking and/or transfers;Assistance with cooking/housework;Assist for transportation    Functional Status Assessment  Patient has had a recent decline in their functional status and demonstrates the ability to make significant improvements in function in a reasonable and predictable amount of time.  Equipment Recommendations  None recommended by OT    Recommendations for Other Services       Precautions /  Restrictions Precautions Precautions: Fall;Other (comment) Precaution Comments: Poor endurance, O2 SATS stable Restrictions Weight Bearing Restrictions Per Provider Order: No      Mobility Bed Mobility Overal bed mobility: Needs Assistance Bed Mobility: Supine to Sit, Sit to Supine     Supine to sit: Min assist, HOB elevated, Used rails Sit to supine: Contact guard assist   General bed mobility comments: increased time and effort to complete. Pt required min A by pulling forard with rails and therapist; CGA with LEs ,gt back onto bed    Transfers Overall transfer level: Needs assistance Equipment used: Rolling walker (2 wheels) Transfers: Sit to/from Stand, Bed to chair/wheelchair/BSC Sit to Stand: Min assist     Step pivot transfers: Min assist     General transfer comment: pt ablle to side step along EOB and step turn to L side      Balance Overall balance assessment: Needs assistance Sitting-balance support: Bilateral upper extremity supported, Feet supported Sitting balance-Leahy Scale: Good     Standing balance support: Bilateral upper extremity supported, During functional activity Standing balance-Leahy Scale: Poor                             ADL either performed or assessed with clinical judgement   ADL Overall ADL's : Needs assistance/impaired Eating/Feeding: Independent   Grooming: Wash/dry hands;Wash/dry face;Oral care;Set up;Supervision/safety;Sitting   Upper Body Bathing: Set up;Supervision/ safety;Sitting   Lower Body Bathing: Moderate assistance   Upper Body Dressing : Set up;Supervision/safety;Sitting   Lower Body Dressing: Moderate assistance   Toilet Transfer: Minimal assistance;Rolling  walker (2 wheels)   Toileting- Clothing Manipulation and Hygiene: Minimal assistance;Sit to/from stand       Functional mobility during ADLs: Minimal assistance;Rolling walker (2 wheels)       Vision Ability to See in Adequate Light: 0  Adequate Patient Visual Report: No change from baseline       Perception         Praxis         Pertinent Vitals/Pain Pain Assessment Pain Assessment: No/denies pain     Extremity/Trunk Assessment Upper Extremity Assessment Upper Extremity Assessment: Generalized weakness   Lower Extremity Assessment Lower Extremity Assessment: Defer to PT evaluation       Communication Communication Communication: No apparent difficulties   Cognition Arousal: Alert Behavior During Therapy: [facility] for tasks assessed/performed                                         General Comments       Exercises     Shoulder Instructions      Home Living Family/patient expects to be discharged to:: Private residence Living Arrangements: Children Available Help at Discharge: Family;Available PRN/intermittently Type of Home: House Home Access: Stairs to enter [employer] of Steps: 2 Entrance Stairs-Rails: Right;Left;Can reach both Home Layout: One level     Bathroom Shower/Tub: Chief Strategy Officer: Handicapped height Bathroom Accessibility: Yes   Home Equipment: Pharmacist, hospital (2 wheels);Cane - single point;Grab bars - tub/shower;BSC/3in1   Additional Comments: great grandson works 24 hours shifts but is off several days in a row. she has other family that checks in also      Prior Functioning/Environment               Mobility Comments: using broom stick when ambulating? on O2 at baseline at home ADLs Comments: Mod I with bathing, dressing, toileting; family assist with transporation. pt still cooks some        OT Problem List: Decreased strength;Impaired balance (sitting and/or standing);Decreased activity tolerance;Cardiopulmonary status limiting activity      OT Treatment/Interventions: Self-care/ADL training;DME and/or AE instruction;Therapeutic activities;Balance training;Patient/family education;Energy conservation     OT Goals(Current goals can be found in the care plan section) Acute Rehab OT Goals Patient Stated Goal: go home OT Goal Formulation: With patient Time For Goal Achievement: 06/11/23 Potential to Achieve Goals: Good ADL Goals Pt Will Perform Grooming: with set-up;sitting Pt Will Perform Upper Body Bathing: with set-up;sitting Pt Will Perform Lower Body Bathing: with min assist;with supervision Pt Will Perform Upper Body Dressing: with set-up;sitting Pt Will Perform Lower Body Dressing: with min assist;with supervision Pt Will Transfer to Toilet: with contact guard assist;with supervision;ambulating Pt Will Perform Toileting - Clothing Manipulation and hygiene: with contact guard assist;with supervision;sit to/from stand  OT Frequency: Min 1X/week    Co-evaluation PT/OT/SLP Co-Evaluation/Treatment: Yes Reason for Co-Treatment: For patient/therapist safety   OT goals addressed during session: ADL's and self-care;Proper use of Adaptive equipment and DME      AM-PAC OT "6 Clicks" Daily Activity     Outcome Measure Help from another person eating meals?: None Help from another person taking care of personal grooming?: A Little Help from another person toileting, which includes using toliet, bedpan, or urinal?: A Little Help from another person bathing (including washing, rinsing, drying)?: A Lot Help from another person to put on and taking off regular upper body clothing?: A Little  Help from another person to put on and taking off regular lower body clothing?: A Lot 6 Click Score: 17   End of Session Equipment Utilized During Treatment: Gait belt;Rolling walker (2 wheels)  Activity Tolerance: Patient limited by fatigue Patient left: in bed  OT Visit Diagnosis: Unsteadiness on feet (R26.81);Muscle weakness (generalized) (M62.81)                Time: 1610-9604 OT Time Calculation (min): 29 min Charges:  OT General Charges $OT Visit: 1 Visit OT Evaluation $OT Eval Moderate  Complexity: 1 Mod   Galen Manila 05/28/2023, 1:54 PM

## 2023-05-28 NOTE — Assessment & Plan Note (Signed)
Hemoglobin is trending down to 7.4, stable here she has multiple antibodies. - Check type and screen - Continue supplemental iron

## 2023-05-28 NOTE — Progress Notes (Addendum)
[name]   DOB:1933-11-05   ZO#:[id number]      ASSESSMENT & PLAN:  1.  Primary pancreatic neuroendocrine tumor T3N0 - EUS done 03/04/2022 showed a T3N0 mass in the pancreatic head with 2 satellite lesions in the tail.  Biopsy confirmed NET of pancreatic head mass. - Status post bevacizumab, last given 03/26/2023 as patient was planned for surgical resection of skin cancer on her left foot. - She is also on Sandostatin injections every 28 days which was started on 03/27/2022. - Now on hospice program at home. - Medical oncology/Dr. Mosetta Putt follows closely.  2.  Anemia/iron deficiency anemia - Chronic blood loss anemia for at least 10 years or more.  Patient has had multiple hospital admissions for anemia. - Status post IV iron infusions in the past and ESA - Hemoglobin today 7.4.  Baseline has been between 8-8.5. - No transfusional requirements today. - May consider IV iron infusion  -Repeat CBC and differential in AM  3.  Acute on chronic diastolic CHF - Patient currently admitted due to shortness of breath and hypoxia. - Continue IV diuretics as ordered - Continue supportive care with O2 - Patient follows with Dr. Shirlee Latch heart failure, continue follow-up upon discharge  4.  Bilateral lower extremity wounds - Wound care as ordered  5.  Skin cancer - Painful skin cancer lesion left foot - Has been seen by dermatology and surgery.     Code Status DNR-limited   Subjective:  Patient is seen awake and alert laying in bed although she states she feels lethargic.  Some shortness of breath is noted during conversation.  Reports that she did not sleep well last night because her legs felt like they were "burning".  Patient with O2 via nasal cannula intact.  No other acute distress is noted at this time.  Objective:  Vitals:   05/27/23 2100 05/28/23 0548  BP: (!) 139/50 128/61  Pulse: (!) 53 (!) 53  Resp: (!) 24 17  Temp: 98.2 F (36.8 C) 98.3 F (36.8 C)  SpO2: 100% 100%      Intake/Output Summary (Last 24 hours) at 05/28/2023 1009 Last data filed at 05/28/2023 0900 Gross per 24 hour  Intake 679.3 ml  Output 950 ml  Net -270.7 ml     REVIEW OF SYSTEMS:   Constitutional: Denies fevers, chills or abnormal night sweats Eyes: Denies blurriness of vision, double vision or watery eyes Ears, nose, mouth, throat, and face: Denies mucositis or sore throat Respiratory: +Shortness of breath  Cardiovascular: Denies palpitation, chest discomfort or lower extremity swelling Gastrointestinal:  Denies nausea, heartburn or change in bowel habits Skin: +Bilateral lower extremity wounds  Lymphatics: Denies new lymphadenopathy or easy bruising Neurological: Denies numbness, tingling or new weaknesses Behavioral/Psych: Mood is stable, no new changes  All other systems were reviewed with the patient and are negative.  PHYSICAL EXAMINATION: ECOG PERFORMANCE STATUS: 3 - Symptomatic, >50% confined to bed  Vitals:   05/27/23 2100 05/28/23 0548  BP: (!) 139/50 128/61  Pulse: (!) 53 (!) 53  Resp: (!) 24 17  Temp: 98.2 F (36.8 C) 98.3 F (36.8 C)  SpO2: 100% 100%   Filed Weights   05/27/23 2200 05/28/23 0614  Weight: 139 lb 15.9 oz (63.5 kg) 139 lb 15.9 oz (63.5 kg)    GENERAL: alert, +mild distress  SKIN: +Pale skin color, +bilateral lower extremity wounds EYES: normal, conjunctiva are pink and non-injected, sclera clear OROPHARYNX: no exudate, no erythema and lips, buccal mucosa, and tongue  normal  NECK: supple, thyroid normal size, non-tender, without nodularity LYMPH: no palpable lymphadenopathy in the cervical, axillary or inguinal LUNGS: clear to auscultation and percussion with normal breathing effort HEART: regular rate & rhythm and no murmurs and no lower extremity edema ABDOMEN: abdomen soft, non-tender and normal bowel sounds MUSCULOSKELETAL: no cyanosis of digits and no clubbing  PSYCH: alert & oriented x 3 with fluent speech NEURO: no focal  motor/sensory deficits   All questions were answered. The patient knows to call the clinic with any problems, questions or concerns.   The total time spent in the appointment was 40 minutes encounter with patient including review of chart and various tests results, discussions about plan of care and coordination of care plan  Dawson Bills, NP 05/28/2023 10:09 AM    Labs Reviewed:  Lab Results  Component Value Date   WBC 6.4 05/28/2023   HGB 7.4 (L) 05/28/2023   HCT 25.9 (L) 05/28/2023   MCV 83.3 05/28/2023   PLT 265 05/28/2023   Recent Labs    03/18/23 1331 03/26/23 1315 05/14/23 0958 05/27/23 1723 05/28/23 0411  NA 139   < > 137 132* 132*  K 4.6   < > 4.5 3.6 3.6  CL 102   < > 100 98 98  CO2 31   < > 31 23 26   GLUCOSE 99   < > 158* 118* 172*  BUN 45*   < > 51* 62* 66*  CREATININE 1.82*   < > 1.66* 1.62* 1.58*  CALCIUM 9.4   < > 9.1 8.8* 8.7*  GFRNONAA 27*   < > 30* 31* 31*  PROT 8.0  --  7.6 7.5  --   ALBUMIN 4.2  --  3.8 3.3*  --   AST 14*  --  14* 26  --   ALT 6  --  7 12  --   ALKPHOS 53  --  61 66  --   BILITOT 0.6  --  0.6 1.2  --    < > = values in this interval not displayed.    Studies Reviewed:  DG Chest Portable 1 View Result Date: 05/27/2023 CLINICAL DATA:  Shortness of breath EXAM: PORTABLE CHEST 1 VIEW COMPARISON:  01/30/2023 FINDINGS: Left-sided pacing device as before. Cardiomegaly with similar sized pleural effusions. Mild vascular congestion. Aortic atherosclerosis. No pneumothorax IMPRESSION: Cardiomegaly with vascular congestion and similar sized pleural effusions. Overall no significant change compared to prior Electronically Signed   By: Jasmine Pang M.D.   On: 05/27/2023 16:17   ADDENDUM I have seen the patient, examined her. I agree with the assessment and and plan and have edited the notes.   Patient is under my care for pancreatic neuroendocrine tumor, on Sandostatin, and recurrent iron deficient anemia, for which she has been receiving  blood transfusion and bevacizumab for GI bleeding in my office.  She has multiple comorbidities, especially CHF, and bilateral lower extremity edema and wound.  Her hemoglobin was 7.4 this morning, I recommend 1 unit of blood transfusion with premedications including Lasix.  Patient has been seen by palliative care, appreciate their input.  I think the patient would be a candidate for hospice in near future.  I will follow-up as needed.  She has outpatient follow-up with me in 2-3 weeks.   Malachy Mood MD 05/28/2023

## 2023-05-28 NOTE — Progress Notes (Signed)
  Echocardiogram 2D Echocardiogram has been performed.  [name] 05/28/2023, 3:45 PM

## 2023-05-28 NOTE — Progress Notes (Signed)
   This pt is active with our program at home. We will follow and support with d/c plan as needed.  Norm Parcel RN  [phone]

## 2023-05-28 NOTE — Assessment & Plan Note (Signed)
 Wean oxygen as able

## 2023-05-28 NOTE — Evaluation (Signed)
Physical Therapy Evaluation Patient Details Name: DEYNA MARBUT MRN: [id number] DOB: 1933-08-21 Today's Date: 05/28/2023  History of Present Illness  89 y.o. female with hx of diastolic heart failure, pulmonary hypertension, status post AICD, history of GI bleed, history of neuroendocrine pancreatic cancer, chronic resp failure was on 2 L O2 [place], has COPD as well. Patient admitted with worsening LE edema, generalized malaise and fatigue. Dx: acute on chronic CHF exacerbation  Clinical Impression  On eval, pt required Min A for mobility. She was able to stand and take several side steps with RW at bedside. Pt remained on 4L [place] O2-sats 100%. Pt fatigues quickly and easily with minimal activity. Assisted pt back to bed at end of session. Will plan to follow and progress activity as tolerated.         If plan is discharge home, recommend the following: A little help with walking and/or transfers;A little help with bathing/dressing/bathroom;Assistance with cooking/housework;Assist for transportation;Help with stairs or ramp for entrance   Can travel by private vehicle        Equipment Recommendations Rollator (4 wheels)  Recommendations for Other Services       Functional Status Assessment Patient has had a recent decline in their functional status and demonstrates the ability to make significant improvements in function in a reasonable and predictable amount of time.     Precautions / Restrictions Precautions Precautions: Fall Precaution Comments: Poor endurance;O2 dep at baseline Restrictions Weight Bearing Restrictions Per Provider Order: No      Mobility  Bed Mobility Overal bed mobility: Needs Assistance Bed Mobility: Supine to Sit, Sit to Supine     Supine to sit: Min assist Sit to supine: Contact guard assist   General bed mobility comments: increased time and effort to complete. Pt required min A by pulling forard with rails and therapist    Transfers Overall  transfer level: Needs assistance Equipment used: Rolling walker (2 wheels) Transfers: Sit to/from Stand Sit to Stand: Min assist           General transfer comment: Cues for safety, hand placement. Assist to power up, steady, control descent. Fatigues easily and quickly    Ambulation/Gait Ambulation/Gait assistance: Min assist   Assistive device: Rolling walker (2 wheels)       Pre-gait activities: side steps along bedside with RW    Stairs            Wheelchair Mobility     Tilt Bed    Modified Rankin (Stroke Patients Only)       Balance Overall balance assessment: Needs assistance         Standing balance support: Bilateral upper extremity supported, Reliant on assistive device for balance Standing balance-Leahy Scale: Poor                               Pertinent Vitals/Pain Pain Assessment Pain Assessment: Faces Faces Pain Scale: Hurts even more Pain Location: bil LEs (with dependency) Pain Intervention(s): Limited activity within patient's tolerance, Monitored during session, Repositioned    Home Living Family/patient expects to be discharged to:: Private residence Living Arrangements: Children Available Help at Discharge: Family;Available PRN/intermittently Type of Home: House Home Access: Stairs to enter Entrance Stairs-Rails: Right;Left;Can reach both Entrance Stairs-Number of Steps: 2   Home Layout: One level Home Equipment: Pharmacist, hospital (2 wheels);Cane - single point;Grab bars - tub/shower;BSC/3in1 Additional Comments: great grandson works 24 hours shifts but is off several  days in a row. she has other family that checks in also    Prior Function               Mobility Comments: using broom stick when ambulating? on O2 at baseline at home ADLs Comments: Mod I with bathing, dressing, toileting; family assist with transporation. pt still cooks some     Extremity/Trunk Assessment   Upper Extremity  Assessment Upper Extremity Assessment: Defer to OT evaluation    Lower Extremity Assessment Lower Extremity Assessment: Generalized weakness (LE redness, skin discoloration, weeping)    Cervical / Trunk Assessment Cervical / Trunk Assessment: Normal  Communication   Communication Communication: No apparent difficulties  Cognition Arousal: Alert Behavior During Therapy: WFL for tasks assessed/performed Overall Cognitive Status: Within Functional Limits for tasks assessed                                          General Comments      Exercises     Assessment/Plan    PT Assessment Patient needs continued PT services  PT Problem List Decreased strength;Decreased range of motion;Decreased activity tolerance;Decreased balance;Decreased mobility;Decreased knowledge of use of DME       PT Treatment Interventions DME instruction;Gait training;Functional mobility training;Therapeutic activities;Therapeutic exercise;Patient/family education;Balance training    PT Goals (Current goals can be found in the Care Plan section)  Acute Rehab PT Goals Patient Stated Goal: to get better PT Goal Formulation: With patient Time For Goal Achievement: 06/18/23 Potential to Achieve Goals: Good    Frequency Min 1X/week     Co-evaluation PT/OT/SLP Co-Evaluation/Treatment: Yes Reason for Co-Treatment: For patient/therapist safety PT goals addressed during session: Mobility/safety with mobility OT goals addressed during session: ADL's and self-care;Proper use of Adaptive equipment and DME       AM-PAC PT "6 Clicks" Mobility  Outcome Measure Help needed turning from your back to your side while in a flat bed without using bedrails?: A Little Help needed moving from lying on your back to sitting on the side of a flat bed without using bedrails?: A Little Help needed moving to and from a bed to a chair (including a wheelchair)?: A Little Help needed standing up from a chair  using your arms (e.g., wheelchair or bedside chair)?: A Little Help needed to walk in hospital room?: A Lot Help needed climbing 3-5 steps with a railing? : A Lot 6 Click Score: 16    End of Session Equipment Utilized During Treatment: Oxygen Activity Tolerance: Patient limited by fatigue Patient left: in bed;with call bell/phone within reach;with bed alarm set   PT Visit Diagnosis: Muscle weakness (generalized) (M62.81);Difficulty in walking, not elsewhere classified (R26.2)    Time: 9629-5284 PT Time Calculation (min) (ACUTE ONLY): 31 min   Charges:   PT Evaluation $PT Eval Low Complexity: 1 Low   PT General Charges $$ ACUTE PT VISIT: 1 Visit           Faye Ramsay, PT Acute Rehabilitation  Office: [phone]

## 2023-05-28 NOTE — Progress Notes (Signed)
  Progress Note   Patient: [name] WJX:[id number] DOB: Dec 05, 1933 DOA: 05/27/2023     1 DOS: the patient was seen and examined on 05/28/2023 at 9:31 AM      Brief hospital course: 89 y.o. 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            Assessment and Plan: * Acute on chronic diastolic CHF (congestive heart failure) (HCC) Net negative 500 cc yesterday, recorded weights appear inaccurate.  Swelling improved.  Cr improved.  K stable - Continue IV Lasix - Daily BMP - Supp K - Strict I/Os - Hold home torsemide, metolazone    Normocytic anemia Hemoglobin is trending down to 7.4, stable here she has multiple antibodies. - Check type and screen - Continue supplemental iron  Primary pancreatic neuroendocrine tumor    Acute on chronic respiratory failure with hypoxia (HCC) Wean oxygen as able  Hyponatremia - Continue Lasix  Pulmonary hypertension (HCC)    PAD (peripheral artery disease) (HCC) - Hold home Crestor  ICD (implantable cardioverter-defibrillator) in place    CKD (chronic kidney disease), stage IIIb (HCC) Creatinine stable relative to baseline 1.5  COPD (chronic obstructive pulmonary disease) (HCC) No evidence of flare - Continue as needed DuoNebs  Atrial fibrillation, permanent (HCC) Not on anticoagulation, rate controlled  Hypertrophic cardiomyopathy (HCC)    Hyperlipidemia - Hold Crestor  Possible cellulitis - Continue antibiotics, narrowed to Keflex        Subjective: Patient's legs are very painful, bilaterally the swelling is improved.  Her breathing does not feel much different.  No fever, no confusion.     Physical Exam: BP 128/61 (BP Location: Right Arm)   Pulse (!) 53   Temp 98.3 F (36.8 C) (Oral)   Resp 17   Ht 5\' 6"   (1.676 m)   Wt 63.5 kg   SpO2 100%   BMI 22.60 kg/m   Frail elderly female, lying in bed, interactive Irregularly irregular, rate slow, no murmurs, she has 1+ pitting edema up to the knees bilaterally, this is improving Respiratory rate normal, lungs clear without rales or wheezes, diminished bases bilaterally Abdomen soft no tenderness palpation or guarding Attention normal, affect anxious, judgment and insight appear at baseline, oriented to person, place, and time, generalized weakness but symmetric strength Severe chronic venous stasis changes, with surrounding redness and tenderness, this is bilaterally, and primarily most likely venous stasis insufficiency   Data Reviewed: Basic metabolic panel shows creatinine slightly down to 1.58, potassium normal Myocardial infarction ruled out, she has mild acute myocardial injury in the setting of CHF BNP 416 Hemoglobin 7.4 and stable TSH normal    Family Communication: None present    Disposition: Status is: Inpatient         Author: Alberteen Sam, MD 05/28/2023 2:19 PM  For on call review [email or web address].

## 2023-05-28 NOTE — Assessment & Plan Note (Signed)
Hold Crestor 

## 2023-05-28 NOTE — Progress Notes (Signed)
2D echo attempted, patient with NP. Will try later

## 2023-05-28 NOTE — Plan of Care (Signed)
  Problem: Elimination: Goal: Will not experience complications related to bowel motility Outcome: Progressing   Problem: Pain Managment: Goal: General experience of comfort will improve and/or be controlled Outcome: Progressing   Problem: Safety: Goal: Ability to remain free from injury will improve Outcome: Progressing   Problem: Skin Integrity: Goal: Risk for impaired skin integrity will decrease Outcome: Progressing

## 2023-05-28 NOTE — Telephone Encounter (Signed)
Daughter called to report pt is currently hospitalized at [facility]  Seen by hospitalist would like HF team to consult  Advised HF team does not consult at [facility] -can notify provider of admission as FYI   Daughter appreciative and would like it documented that hospitalist will not wrap legs to assist with diuresis     Message to provider as [phone]

## 2023-05-28 NOTE — Consult Note (Signed)
WOC Nurse Consult Note: Reason for Consult: LEs; may benefit from Unna's boots Wound type: weeping, no active wounds; redness L>R per MD notes Pressure Injury POA: NA Measurement: NA Wound bed: NA Drainage (amount, consistency, odor) serous weeping; venous statis dermatitis Periwound:intact  Dressing procedure/placement/frequency: Cleanse bilateral LEs with soap and water, pat dry Apply Eucerin to LEs but not between toes Cover any areas that are weeping with single layer of xeroform, top with Kerlix and wrap from base of toes to the knees  Change every other day Elevate legs as much as possible.    Re consult if needed, will not follow at this time. Thanks  Sadia Belfiore [employer], RN,CWOCN, CNS, CWON-AP [phone])

## 2023-05-28 NOTE — Consult Note (Signed)
Consultation Note Date: 05/28/2023   Patient Name: [name]  DOB: January 18, 1934  MRN: [id number]  Age / Sex: 89 y.o., female  PCP: [name], MD Referring Physician: Alberteen Sam, *  Reason for Consultation: Establishing goals of care  HPI/Patient Profile: 89 y.o. female admitted on 05/27/2023   89 year old with diastolic heart failure, pulmonary hypertension, status post AICD, history of GI bleed, history of neuroendocrine pancreatic cancer, chronic resp failure was on 2 L O2 [place], has COPD as well.   Patient admitted with worsening LE edema, generalized malaise and fatigue. Patient has been admitted with acute on chronic diastolic CHF exacerbation.   Clinical Assessment and Goals of Care:  Patient is resting in bed, she states that she has pain and discomfort in both legs, she has had functional decline recently, she has been getting more dyspneic lately, she has had to increase O2 [place] from 2-3 L to 4 L O2 [place].   NEXT OF KIN  Patient has 2 daughters who live nearby, she lives at home with niece and great grand son who is a IT sales professional.   SUMMARY OF RECOMMENDATIONS    Palliative medicine is specialized medical care for people living with serious illness. It focuses on providing relief from the symptoms and stress of a serious illness. The goal is to improve quality of life for both the patient and the family. Goals of care: Broad aims of medical therapy in relation to the patient's values and preferences. Our aim is to provide medical care aimed at enabling patients to achieve the goals that matter most to them, given the circumstances of their particular medical situation and their constraints.   Discussed with the patient regarding her current conditions and hospitalization. Goals and values attempted to be explored. Patient is hopeful that she will have some improvement in her CHF such  that she will have improvement in her symptoms of fatigue and dyspnea.  She is already connected with palliative services, recommend continued outpatient palliative support.   Code Status/Advance Care Planning: DNR   Symptom Management:     Palliative Prophylaxis:  Delirium Protocol  Additional Recommendations (Limitations, Scope, Preferences): Full Scope Treatment  Psycho-social/Spiritual:  Desire for further Chaplaincy support:yes Additional Recommendations: Caregiving  Support/Resources  Prognosis:  Unable to determine  Discharge Planning:  home with continuation of palliative [facility]       Primary Diagnoses: Present on Admission:  Atrial fibrillation, permanent (HCC)  CKD (chronic kidney disease), stage IIIb (HCC)  Acute on chronic diastolic CHF (congestive heart failure) (HCC)  COPD (chronic obstructive pulmonary disease) (HCC)  Primary pancreatic neuroendocrine tumor  Pulmonary hypertension (HCC)  DNR (do not resuscitate)  ICD (implantable cardioverter-defibrillator) in place  Hyperlipidemia  PAD (peripheral artery disease) (HCC)  Normocytic anemia   I have reviewed the medical record, interviewed the patient and family, and examined the patient. The following aspects are pertinent.  Past Medical History:  Diagnosis Date   (HFpEF) heart failure with preserved ejection  fraction (HCC)    EF=60-60%   Actinic keratosis 01/17/2015   R forearm   Adjustment disorder with anxiety    Adverse effect of other narcotics, sequela    Intolerance to all narcotics   Anti-Duffy antibodies present    Aortic valve stenosis    Arthritis    "some in my hands" (11/11/2012)   Atrial fibrillation, permanent (HCC)    Eliquis   Atypical mole 03/25/2018   L forearm - severe   Automatic implantable cardioverter-defibrillator in situ    Avascular necrosis of hip (HCC) 05/03/2011   Carotid artery stenosis 09/2007   a. 09/2007:  60-79% bilateral (stable); b. 10/2008: 40-59% R 60-79%    Cellulitis of left lower extremity 07/13/2020   CKD (chronic kidney disease), stage III (HCC)    COPD (chronic obstructive pulmonary disease) (HCC)    Coronary artery disease    non-obstructive by 2006 cath   COVID-19 virus infection 02/12/2022   Displaced fracture of left femoral neck (HCC) 10/09/2018   GI bleed 03/28/2020   AVM   High cholesterol    Hypertension 05/20/2011   Hypertr obst cardiomyop    Hypotension, unspecified    cardiac cath 2006..nonobstructive CAD 30-40s lesions.Marland KitchenETT 1/09 nondiagnostic due to poor HR response..Right Renal Cancer 2003   Iron deficiency anemia    Long term (current) use of anticoagulants    On home oxygen therapy    3L [place]   Osteoarthritis of right hip    PAD (peripheral artery disease) (HCC)    Pancreatic cancer (HCC)    sandostatin   PONV (postoperative nausea and vomiting)    Presence of permanent cardiac pacemaker    Pulmonary HTN (HCC)    RECTAL BLEEDING 10/13/2009   Qualifier: Diagnosis of  By: Wilmon Pali NP, Paula     Red blood cell antibody positive with compatible PRBC difficult to obtain    Anti FYA (Duffy a) antibody. Must be transfused with PRB which are Duffy Antigen Negative and Crossmatch Compatible   Renal cell carcinoma (HCC)    s/p nephrectomy   Squamous cell carcinoma of skin 01/17/2015   R lat wrist   Squamous cell carcinoma of skin 01/29/2018   R post upper leg - superficially invasive   Squamous cell carcinoma of skin 03/25/2018   L lat foot   Squamous cell carcinoma of skin 11/09/2020   left lat foot - EDC 01/01/21, recurrent 01/31/21 - MOHs 02/22/21   Squamous cell carcinoma of skin 12/19/2021   Right Posterior Medial Thigh, EDC   Squamous cell carcinoma of skin 12/19/2021   SCC IS, L lat heel, EDC 02/04/2022   Squamous cell carcinoma of skin 01/21/2022   L forearm, EDC 02/04/2022   Squamous cell carcinoma of skin 01/21/2022   R lower leg below knee, EDC  02/04/2022   Squamous cell carcinoma of skin 01/21/2022   SCCIS, R post heel, EDC 02/04/2022   Squamous cell carcinoma of skin 07/01/2022   left lower abdomen, in situ, EDC   Squamous cell carcinoma of skin 07/01/2022   left medial chest, EDC   Urge incontinence    Venous stasis of both lower extremities    Social History   Socioeconomic History   Marital status: Widowed    Spouse name: Not on file   Number of children: 2   Years of education: high school   Highest education level: Not on file  Occupational History   Occupation: Retired    Associate Professor: RETIRED  Tobacco Use   Smoking  status: Former    Current packs/day: 0.00    Average packs/day: 0.5 packs/day for 40.0 years (20.0 ttl pk-yrs)    Types: Cigarettes    Start date: 05/06/1961    Quit date: 05/06/2001    Years since quitting: 22.0   Smokeless tobacco: Never  Vaping Use   Vaping status: Never Used  Substance and Sexual Activity   Alcohol use: No    Alcohol/week: 0.0 standard drinks of alcohol   Drug use: No   Sexual activity: Not Currently  Other Topics Concern   Not on file  Social History Narrative   Would desire CPR   07/11/20   From: the area   Living: with great-grandson - Joselyn Glassman (2003)   Work: retired - clerical      Family: 2 children - Darl [place] and [place] - 2 grand chlidren - 5 great grand children      Enjoys: spending time with friends - watch move, eat out, spend time      Exercise: walking around the house   Diet: not good, limits red meat, limits salt, low appetite      Safety   Seat belts: Yes    Guns: Yes  and secure   Safe in relationships: Yes    Social Drivers of Health   Financial Resource Strain: Low Risk  (05/28/2022)   Overall Financial Resource Strain (CARDIA)    Difficulty of Paying Living Expenses: Not hard at all  Food Insecurity: No Food Insecurity (05/27/2023)   Hunger Vital Sign    Worried About Running Out of Food in the Last Year: Never true    Ran Out of Food in the Last  Year: Never true  Transportation Needs: No Transportation Needs (05/27/2023)   PRAPARE - Administrator, Civil Service (Medical): No    Lack of Transportation (Non-Medical): No  Physical Activity: Inactive (09/24/2021)   Exercise Vital Sign    Days of Exercise per Week: 0 days    Minutes of Exercise per Session: 0 min  Stress: No Stress Concern Present (09/24/2021)   [employer] of Occupational Health - Occupational Stress Questionnaire    Feeling of Stress : Not at all  Social Connections: Unknown (05/27/2023)   Social Connection and Isolation Panel [NHANES]    Frequency of Communication with Friends and Family: More than three times a week    Frequency of Social Gatherings with Friends and Family: More than three times a week    Attends Religious Services: More than 4 times per year    Active Member of [employer] or Organizations: No    Attends Engineer, structural: Never    Marital Status: Not on file   Family History  Problem Relation Age of Onset   Heart failure Mother    Early death Father        car accident   Breast cancer Maternal Aunt 82   Breast cancer Cousin    Breast cancer Other    Colon cancer Neg Hx    Esophageal cancer Neg Hx    Liver disease Neg Hx    Pancreatic cancer Neg Hx    Rectal cancer Neg Hx    Scheduled Meds:  acetaminophen  1,000 mg Oral Q8H   ascorbic acid  500 mg Oral Daily   cephALEXin  500 mg Oral Q8H   cholecalciferol  1,000 Units Oral Daily   docusate sodium  100 mg Oral BID   ferrous gluconate  324 mg Oral Q breakfast  furosemide  80 mg Intravenous BID   heparin  5,000 Units Subcutaneous Q8H   hydrocerin   Topical QODAY   pantoprazole  40 mg Oral Daily   potassium chloride SA  40 mEq Oral q morning   sodium chloride flush  3 mL Intravenous Q12H   Continuous Infusions:  sodium chloride     PRN Meds:.sodium chloride, acetaminophen **OR** acetaminophen, fluticasone, hydrALAZINE, HYDROcodone-acetaminophen,  ipratropium-albuterol, ondansetron **OR** ondansetron (ZOFRAN) IV, polyethylene glycol, sodium chloride flush, traMADol Medications Prior to Admission:  Prior to Admission medications   Medication Sig Start Date End Date Taking? Authorizing Provider  acetaminophen (TYLENOL) 500 MG tablet Take 1,000 mg by mouth every 8 (eight) hours.   Yes [provider]  Ascorbic Acid (VITAMIN C PO) Take 1 tablet by mouth daily.   Yes [provider]  Cholecalciferol (VITAMIN D-3 PO) Take 2 capsules by mouth daily.   Yes [provider]  CRANBERRY PO Take 1 tablet by mouth daily.   Yes [provider]  Cyanocobalamin (B-12 PO) Take 1 tablet by mouth daily.   Yes [provider]  ferrous gluconate (FERGON) 324 MG tablet Take 1 tablet (324 mg total) by mouth daily with breakfast. 11/15/22  Yes Rai, Ripudeep K, MD  fluticasone (FLONASE) 50 MCG/ACT nasal spray Place 2 sprays into both nostrils daily as needed for allergies or rhinitis. 07/04/22  Yes [name], MD  metolazone (ZAROXOLYN) 2.5 MG tablet Take 1 tablet (2.5 mg total) by mouth 2 (two) times a week. Every Tuesday and  Saturday with 40 meq of potassium Patient taking differently: Take 2.5 mg by mouth See admin instructions. Take 2.5 mg by mouth on Tuesday and Saturday 04/17/23  Yes Lee, [place], NP  Multiple Vitamins-Minerals (HAIR SKIN NAILS PO) Take 1 capsule by mouth daily.   Yes [provider]  pantoprazole (PROTONIX) 40 MG tablet Take 1 tablet (40 mg total) by mouth daily. Patient taking differently: Take 40 mg by mouth daily before breakfast. 11/09/22  Yes Rai, Ripudeep K, MD  potassium chloride SA (KLOR-CON M) 20 MEQ tablet Take 2 tablets (40 mEq total) by mouth every morning AND 1 tablet (20 mEq total) at bedtime. Take extra 2 tabs when you take metolazone. Patient taking differently: Take 20 mEq by mouth in the morning and evening and an additional 20 mEq once daily on Tues/Sat only 03/07/23   Yes Bensimhon, Bevelyn Buckles, MD  torsemide (DEMADEX) 20 MG tablet Take 5 tablets (100 mg total) by mouth 2 (two) times daily. 01/18/23  Yes [place], Eric J, DO  traMADol (ULTRAM) 50 MG tablet Take 50 mg by mouth every 6 (six) hours as needed (for pain).   Yes [provider]  doxycycline (VIBRAMYCIN) 100 MG capsule Take 1 capsule (100 mg total) by mouth 2 (two) times daily. Patient not taking: Reported on 05/27/2023 04/25/23   Carlean Jews, NP  potassium chloride SA (KLOR-CON M) 20 MEQ tablet Take 1 tablet (20 mEq total) by mouth once a week. Weekly with Metolazone dose every Saturday Patient not taking: Reported on 05/27/2023 03/18/23   Alen Bleacher, NP  rosuvastatin (CRESTOR) 40 MG tablet Take 1 tablet (40 mg total) by mouth daily. Patient not taking: Reported on 05/27/2023 05/02/22   [name], MD   Allergies  Allergen Reactions   Codeine Nausea And Vomiting and Other (See Comments)    Hallucinations, too- CANNOT HAVE; thinks she may have received Narcan to reverse the effect   Dilaudid [Hydromorphone] Other (See  Comments)    Excessive Somnolence- Required Narcan X2   Tape Other (See Comments)    CAN TOLERATE ONLY EASY-RELEASE, PAPER TAPE, AS THE SKIN IS VERY THIN AND WILL BRUISE AND TEAR VERY EASILY   Amoxil [Amoxicillin] Nausea Only   Asa [Aspirin] Other (See Comments)    Told to avoid due to reduced kidney function, has 1 remaining kidney   Ms Contin [Morphine] Nausea And Vomiting and Other (See Comments)    Hallucinations, also   Neurontin [Gabapentin] Other (See Comments)    "Out of it"   Nsaids Other (See Comments)    Told to avoid due to reduced kidney function, has 1 remaining kidney   Review of Systems +pain in legs  Physical Exam Awake alert No distress Pain and swelling in both legs Abdomen not distended not tender Diminished breath sounds, has AICD Is on 2-3 L O2 [place] at home, currently on 4 L O2 [place].   Vital Signs: BP 128/61 (BP Location: Right Arm)    Pulse (!) 53   Temp 98.3 F (36.8 C) (Oral)   Resp 17   Ht 5\' 6"  (1.676 m)   Wt 63.5 kg   SpO2 100%   BMI 22.60 kg/m  Pain Scale: 0-10   Pain Score: 4    SpO2: SpO2: 100 % O2 Device:SpO2: 100 % O2 Flow Rate: .O2 Flow Rate (L/min): 4 L/min  IO: Intake/output summary:  Intake/Output Summary (Last 24 hours) at 05/28/2023 1145 Last data filed at 05/28/2023 0900 Gross per 24 hour  Intake 679.3 ml  Output 950 ml  Net -270.7 ml    LBM: Last BM Date : 05/27/23 Baseline Weight: Weight: 63.5 kg Most recent weight: Weight: 63.5 kg     Palliative Assessment/Data:   PPS 60%   Time In:  10.30 Time Out:  11.30 Time Total:  60 min.  Greater than 50%  of this time was spent counseling and coordinating care related to the above assessment and plan.  Signed by: Rosalin Hawking, MD   Please contact Palliative Medicine Team phone at [phone] for questions and concerns.  For individual provider: See Loretha Stapler

## 2023-05-28 NOTE — Assessment & Plan Note (Signed)
Continue Lasix 

## 2023-05-28 NOTE — Assessment & Plan Note (Signed)
No evidence of flare - Continue as needed DuoNebs

## 2023-05-28 NOTE — Assessment & Plan Note (Signed)
Net negative 500 cc yesterday, recorded weights appear inaccurate.  Swelling improved.  Cr improved.  K stable - Continue IV Lasix - Daily BMP - Supp K - Strict I/Os - Hold home torsemide, metolazone

## 2023-05-28 NOTE — Progress Notes (Signed)
Heart Failure Navigator Progress Note  Assessed for Heart & Vascular TOC clinic readiness.  Patient does not meet criteria due to Advanced Heart Failure Team patient of Dr. McLean.   Navigator will sign off at this time.   Mahreen Schewe, BSN, RN Heart Failure Nurse Navigator Secure Chat Only   

## 2023-05-28 NOTE — Assessment & Plan Note (Signed)
Hold home Crestor °

## 2023-05-29 DIAGNOSIS — I5033 Acute on chronic diastolic (congestive) heart failure: Secondary | ICD-10-CM | POA: Diagnosis not present

## 2023-05-29 LAB — CBC
HCT: 26.6 % — ABNORMAL LOW (ref 36.0–46.0)
Hemoglobin: 7.1 g/dL — ABNORMAL LOW (ref 12.0–15.0)
MCH: 23.4 pg — ABNORMAL LOW (ref 26.0–34.0)
MCHC: 26.7 g/dL — ABNORMAL LOW (ref 30.0–36.0)
MCV: 87.5 fL (ref 80.0–100.0)
Platelets: 237 10*3/uL (ref 150–400)
RBC: 3.04 MIL/uL — ABNORMAL LOW (ref 3.87–5.11)
RDW: 20.7 % — ABNORMAL HIGH (ref 11.5–15.5)
WBC: 5.1 10*3/uL (ref 4.0–10.5)
nRBC: 0.4 % — ABNORMAL HIGH (ref 0.0–0.2)

## 2023-05-29 LAB — COMPREHENSIVE METABOLIC PANEL
ALT: 11 U/L (ref 0–44)
AST: 15 U/L (ref 15–41)
Albumin: 2.9 g/dL — ABNORMAL LOW (ref 3.5–5.0)
Alkaline Phosphatase: 57 U/L (ref 38–126)
Anion gap: 10 (ref 5–15)
BUN: 67 mg/dL — ABNORMAL HIGH (ref 8–23)
CO2: 28 mmol/L (ref 22–32)
Calcium: 8.9 mg/dL (ref 8.9–10.3)
Chloride: 98 mmol/L (ref 98–111)
Creatinine, Ser: 1.7 mg/dL — ABNORMAL HIGH (ref 0.44–1.00)
GFR, Estimated: 29 mL/min — ABNORMAL LOW (ref >60)
Glucose, Bld: 115 mg/dL — ABNORMAL HIGH (ref 70–99)
Potassium: 3.9 mmol/L (ref 3.5–5.1)
Sodium: 136 mmol/L (ref 135–145)
Total Bilirubin: 0.6 mg/dL (ref 0.0–1.2)
Total Protein: 7 g/dL (ref 6.5–8.1)

## 2023-05-29 LAB — PREPARE RBC (CROSSMATCH)

## 2023-05-29 MED ORDER — METHYLPREDNISOLONE SODIUM SUCC 125 MG IJ SOLR
125.0000 mg | Freq: Once | INTRAMUSCULAR | Status: AC
  Administered 2023-05-29: 125 mg via INTRAVENOUS
  Filled 2023-05-29: qty 2

## 2023-05-29 MED ORDER — METHYLPREDNISOLONE SODIUM SUCC 125 MG IJ SOLR
INTRAMUSCULAR | Status: AC
  Filled 2023-05-29: qty 2

## 2023-05-29 MED ORDER — FAMOTIDINE IN NACL 20-0.9 MG/50ML-% IV SOLN
20.0000 mg | Freq: Once | INTRAVENOUS | Status: AC
  Administered 2023-05-29: 20 mg via INTRAVENOUS
  Filled 2023-05-29 (×2): qty 50

## 2023-05-29 MED ORDER — ACETAMINOPHEN 325 MG PO TABS
650.0000 mg | ORAL_TABLET | Freq: Once | ORAL | Status: DC
  Filled 2023-05-29: qty 2

## 2023-05-29 MED ORDER — SODIUM CHLORIDE 0.9% IV SOLUTION: Freq: Once | INTRAVENOUS | Status: DC

## 2023-05-29 MED ORDER — DIPHENHYDRAMINE HCL 25 MG PO CAPS
50.0000 mg | ORAL_CAPSULE | Freq: Once | ORAL | Status: AC
  Administered 2023-05-29: 50 mg via ORAL
  Filled 2023-05-29: qty 2

## 2023-05-29 NOTE — Progress Notes (Signed)
  Progress Note   Patient: [name] [id number] DOB: 1934/01/23 DOA: 05/27/2023     2 DOS: the patient was seen and examined on 05/29/2023       Brief hospital course: 89 y.o. 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            Assessment and Plan: * Acute on chronic diastolic CHF (congestive heart failure) (HCC) Net -1.8 L yesterday, recorded weights unchanged.  Swelling improved again, JVP still up.  Creatinine slightly up, potassium stable - Continue IV Lasix - Transfuse blood - Daily BMP, monitor potassium, strict I's and O's - Hold home torsemide and metolazone   Possible cellulitis - Continue cephalexin  Normocytic anemia Hemoglobin down to 7.1. - Transfuse 1 unit PRBCs - Check iron studies  Bilateral venous stasis ulcers Patient refusing wound care  PAD (peripheral artery disease) (HCC) - Hold home Crestor    CKD (chronic kidney disease), stage IIIb (HCC) Creatinine slightly up from baseline  COPD (chronic obstructive pulmonary disease) (HCC) No evidence of flare - Continue as needed DuoNebs            Subjective: Patient's legs appear slightly less swollen, there is still quite red and tender.  No confusion, fever.     Physical Exam: BP 132/63 (BP Location: Left Arm)   Pulse 64   Temp 97.7 F (36.5 C) (Oral)   Resp 15   Ht 5\' 6"  (1.676 m)   Wt 63.6 kg   SpO2 100%   BMI 22.63 kg/m   Elderly adult female, sitting on the edge of the bed, interactive and appropriate RRR, systolic murmur noted, JVP elevated, pitting edema to the midshin bilaterally, legs very tender Respiratory rate normal, lung sounds diminished overall, no wheezing Attention normal, affect appropriate, judgment and insight appear normal    Data Reviewed: Basic  metabolic panel shows creatinine up to 1.7, potassium stable CBC shows hemoglobin down to 7.1  Family Communication: Daughter by phone    Disposition: Status is: Inpatient         Author: Alberteen Sam, MD 05/29/2023 1:21 PM  For on call review [email or web address].

## 2023-05-29 NOTE — TOC Initial Note (Signed)
Transition of Care [facility]) - Initial/Assessment Note    Patient Details  Name: [name] MRN: [id number] Date of Birth: 03-01-1934  Transition [facility] CM/SW Contact:    Larrie Kass, LCSW Phone Number: 05/29/2023, 1:13 PM  Clinical Narrative:                 CSW met with the pt and her daughter at the bedside to discuss recommendations for home health services. The pt has agreed and reports having [facility] in the past. Pt would like [facility] again. The pt is requesting a rollator and prefers it through [employer] DME should be delivered to the pt's room before discharge. The pt has home oxygen through [facility] and reports having her portable oxygen ready for discharge. DME and home health orders need to be placed, MD made aware. CSW sent a referral to [facility] with [facility]. Pt reports her family will transport her home. TOC to follow for d/c needs.    Expected Discharge Plan: Home w Home Health Services Barriers to Discharge: Continued Medical Work up   Patient Goals and CMS Choice Patient states their goals for this hospitalization and ongoing recovery are:: retrun home with home health CMS Medicare.gov Compare Post Acute Care list provided to:: Patient Choice offered to / list presented to : Patient      Expected Discharge Plan and Services       Living arrangements for the past 2 months: Single Family Home                           HH Arranged: PT HH Agency: [facility] Date [facility] Agency Contacted: 05/29/23 Time HH Agency Contacted: [id number] Representative spoke with at [facility] Agency: lynette  Prior Living Arrangements/Services Living arrangements for the past 2 months: Single Family Home Lives with:: Self Patient language and need for interpreter reviewed:: Yes Do you feel safe going back to the place where you live?: Yes      Need for Family Participation in Patient Care: No (Comment) Care giver support system in place?: No  (comment) Current home services:  (OP Pallitive) Criminal Activity/Legal Involvement Pertinent to Current Situation/Hospitalization: No - Comment as needed  Activities of Daily Living   ADL Screening (condition at time of admission) Independently performs ADLs?: No Does the patient have a NEW difficulty with bathing/dressing/toileting/self-feeding that is expected to last >3 days?: No Does the patient have a NEW difficulty with getting in/out of bed, walking, or climbing stairs that is expected to last >3 days?: No Does the patient have a NEW difficulty with communication that is expected to last >3 days?: No Is the patient deaf or have difficulty hearing?: No Does the patient have difficulty seeing, even when wearing glasses/contacts?: No Does the patient have difficulty concentrating, remembering, or making decisions?: No  Permission Sought/Granted               Permission granted to share info w Contact Information: daughter  Emotional Assessment Appearance:: Appears stated age Attitude/Demeanor/Rapport: Gracious, Engaged, Charismatic Affect (typically observed): Accepting Orientation: : Oriented to  Time, Oriented to Situation, Oriented to Place, Oriented to Self   Psych Involvement: No (comment)  Admission diagnosis:  CHF (congestive heart failure) (HCC) [I50.9] Acute on chronic systolic congestive heart failure (HCC) [I50.23] Patient Active Problem List   Diagnosis Date Noted   Hyponatremia 05/28/2023   Acute on chronic respiratory failure with hypoxia (HCC) 05/28/2023   (HFpEF)  heart failure with preserved ejection fraction (HCC)    Renal cell carcinoma s/p nephrectomy 2003(HCC) 11/25/2022   Herpes zoster 09/02/2022   Decreased hearing of right ear 07/25/2022   DNR (do not resuscitate) 07/25/2022   Bilateral impacted cerumen 05/20/2022   Chronic hypoxic respiratory failure, on home oxygen therapy (HCC) 04/03/2022   Primary pancreatic neuroendocrine tumor 03/20/2022    Osteoporosis 10/11/2021   Squamous cell carcinoma of foot, 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   Acute gout due to renal impairment involving left foot 01/30/2021   Vertigo 12/05/2020   History of blood transfusion reaction 04/17/2020   Angiodysplasia of small intestine    Gastric polyps    GI bleed 03/28/2020   Palliative care by specialist    Advanced directives, counseling/discussion    Normocytic anemia 01/06/2020   History of gastritis 11/11/2019   Chronic venous hypertension w ulceration (HCC)    Acute on chronic diastolic CHF (congestive heart failure) (HCC) 11/04/2018   Chronic bilateral pleural effusions 11/04/2018   Iliac artery stenosis, bilateral (HCC) 07/14/2017   Chronic blood loss anemia 11/18/2016   Pulmonary hypertension (HCC) 12/04/2015   B12 deficiency 05/22/2015   PAD (peripheral artery disease) (HCC)    History of nephrectomy secondary to RCC    TIA (transient ischemic attack) 08/16/2014   Other fatigue 01/14/2014   Chronic diastolic CHF (congestive heart failure) (HCC) 06/04/2013   Mitral valve regurgitation 06/04/2013   Allergic rhinitis 01/01/2013   Chronic cholecystitis with calculus 09/15/2012   Claudication (HCC) 01/21/2012   Lightheadedness 11/26/2011   Hypertension 05/20/2011   HOCM/IHSS 07/14/2009   Hyperlipidemia 04/10/2009   INCONTINENCE, URGE 04/10/2009   Hypertrophic cardiomyopathy (HCC) 10/27/2008   Atrial fibrillation, permanent (HCC) 10/27/2008   SINUS BRADYCARDIA 10/27/2008   Carotid artery stenosis 10/27/2008   COPD (chronic obstructive pulmonary disease) (HCC) 10/27/2008   CKD (chronic kidney disease), stage IIIb (HCC) 10/27/2008   ICD (implantable cardioverter-defibrillator) in place 10/27/2008   PCP:  [name], MD Pharmacy:   [facility] [place], [place] - [address] [place] [address] Phone: [phone] Fax: [phone]     Social Drivers of Health (SDOH) Social History: SDOH Screenings    Food Insecurity: No Food Insecurity (05/27/2023)  Housing: Low Risk  (05/27/2023)  Transportation Needs: No Transportation Needs (05/27/2023)  Utilities: Not At Risk (05/27/2023)  Depression (PHQ2-9): Low Risk  (02/24/2023)  Financial Resource Strain: Low Risk  (05/28/2022)  Physical Activity: Inactive (09/24/2021)  Social Connections: Moderately Isolated (05/28/2023)  Stress: No Stress Concern Present (09/24/2021)  Tobacco Use: Medium Risk (05/27/2023)   SDOH Interventions:     Readmission Risk Interventions    01/31/2023    3:53 PM 11/09/2022   12:00 PM 02/15/2021   11:38 AM  Readmission Risk Prevention Plan  Transportation Screening Complete Complete Complete  PCP or Specialist Appt within 5-7 Days   Complete  PCP or Specialist Appt within 3-5 Days  Complete   Home Care Screening   Complete  Medication Review (RN CM)   Complete  HRI or Home Care Consult Complete Complete   Social Work Consult for Recovery Care Planning/Counseling  Complete   Palliative Care Screening Not Applicable    Medication Review Oceanographer) Complete Complete

## 2023-05-29 NOTE — Progress Notes (Signed)
PMT no charge note.   Patient currently in the bathroom, no distress, ECHO done, chart reviewed.  Patient [facility] Continue current mode of care Recommend ongoing outpatient palliative support.  No new inpatient PMT specific recommendations at this time.  No charge Rosalin Hawking MD [place] [facility].

## 2023-05-29 NOTE — Plan of Care (Signed)
  Problem: Elimination: Goal: Will not experience complications related to bowel motility Outcome: Progressing   Problem: Pain Managment: Goal: General experience of comfort will improve and/or be controlled Outcome: Progressing   Problem: Safety: Goal: Ability to remain free from injury will improve Outcome: Progressing   Problem: Skin Integrity: Goal: Risk for impaired skin integrity will decrease Outcome: Progressing

## 2023-05-30 DIAGNOSIS — I5033 Acute on chronic diastolic (congestive) heart failure: Secondary | ICD-10-CM | POA: Diagnosis not present

## 2023-05-30 LAB — COMPREHENSIVE METABOLIC PANEL
ALT: 10 U/L (ref 0–44)
AST: 18 U/L (ref 15–41)
Albumin: 3 g/dL — ABNORMAL LOW (ref 3.5–5.0)
Alkaline Phosphatase: 62 U/L (ref 38–126)
Anion gap: 12 (ref 5–15)
BUN: 64 mg/dL — ABNORMAL HIGH (ref 8–23)
CO2: 26 mmol/L (ref 22–32)
Calcium: 8.8 mg/dL — ABNORMAL LOW (ref 8.9–10.3)
Chloride: 96 mmol/L — ABNORMAL LOW (ref 98–111)
Creatinine, Ser: 1.6 mg/dL — ABNORMAL HIGH (ref 0.44–1.00)
GFR, Estimated: 31 mL/min — ABNORMAL LOW (ref >60)
Glucose, Bld: 150 mg/dL — ABNORMAL HIGH (ref 70–99)
Potassium: 4.2 mmol/L (ref 3.5–5.1)
Sodium: 134 mmol/L — ABNORMAL LOW (ref 135–145)
Total Bilirubin: 1.6 mg/dL — ABNORMAL HIGH (ref 0.0–1.2)
Total Protein: 7.5 g/dL (ref 6.5–8.1)

## 2023-05-30 LAB — TYPE AND SCREEN
ABO/RH(D): A NEG
Antibody Screen: POSITIVE
DAT, IgG: NEGATIVE
Unit division: 0

## 2023-05-30 LAB — CBC
HCT: 30.3 % — ABNORMAL LOW (ref 36.0–46.0)
Hemoglobin: 8.7 g/dL — ABNORMAL LOW (ref 12.0–15.0)
MCH: 24.3 pg — ABNORMAL LOW (ref 26.0–34.0)
MCHC: 28.7 g/dL — ABNORMAL LOW (ref 30.0–36.0)
MCV: 84.6 fL (ref 80.0–100.0)
Platelets: 254 10*3/uL (ref 150–400)
RBC: 3.58 MIL/uL — ABNORMAL LOW (ref 3.87–5.11)
RDW: 19.9 % — ABNORMAL HIGH (ref 11.5–15.5)
WBC: 7.3 10*3/uL (ref 4.0–10.5)
nRBC: 0.3 % — ABNORMAL HIGH (ref 0.0–0.2)

## 2023-05-30 LAB — BPAM RBC
Blood Product Expiration Date: 202502202359
ISSUE DATE / TIME: 202501232220
Unit Type and Rh: 600

## 2023-05-30 LAB — FERRITIN: Ferritin: 20 ng/mL (ref 11–307)

## 2023-05-30 LAB — IRON AND TIBC
Iron: 121 ug/dL (ref 28–170)
Saturation Ratios: 37 % — ABNORMAL HIGH (ref 10.4–31.8)
TIBC: 329 ug/dL (ref 250–450)
UIBC: 208 ug/dL

## 2023-05-30 NOTE — Progress Notes (Signed)
  Progress Note   Patient: [name] [id number]:[id number] DOB: 12-06-1933 DOA: 05/27/2023     3 DOS: the patient was seen and examined on 05/30/2023 at 9:38 AM      Brief hospital course: 89 y.o. 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            Assessment and Plan: * Acute on chronic diastolic CHF (congestive heart failure) (HCC) Pulmonary hypertension (HCC) Acute right heart failure Hypertrophic cardiomyopathy (HCC)  ICD (implantable cardioverter-defibrillator) in place Net -1.4 L yesterday, 3.2 L on admission, creatinine improved, potassium stable.  Recorded weights inaccurate or inconsistent with clinical observations - Continue IV Lasix - Daily BMP - Supp K - Strict I/Os - Hold home torsemide, metolazone    Normocytic anemia Hemoglobin trended down to 7.1.  She was transfused 1/23, and her hemoglobin has improved today to 8.7. - Continue oral iron  Primary pancreatic neuroendocrine tumor -Outpatient oncology follow-up  Acute on chronic respiratory failure with hypoxia (HCC) Resumed home oxygen  Hyponatremia - Continue IV Lasix      PAD (peripheral artery disease) (HCC) - Hold home Crestor  CKD (chronic kidney disease), stage IIIb (HCC) Creatinine stable relative to baseline 1.5  COPD (chronic obstructive pulmonary disease) (HCC) No evidence of flare - Continue as needed DuoNebs  Atrial fibrillation, permanent (HCC) Not on anticoagulation, rate controlled  Hyperlipidemia - Hold Crestor  Venous stasis ulcers - Continue wound care  Possible cellulitis around venous stasis ulcers - Continue Keflex       Subjective: Patient is feeling overall better, still feeling short of breath with exertion, and only able to walk short distances, minimal activity  possible     Physical Exam: BP (!) 144/53 (BP Location: Right Arm)   Pulse 60   Temp 97.9 F (36.6 C) (Axillary)   Resp 16   Ht 5\' 6"  (1.676 m)   Wt 64.1 kg   SpO2 98%   BMI 22.81 kg/m   Elderly adult female, sitting up in bed, tired but interactive RRR, soft systolic murmur, JVP elevated, 1+ pitting edema in the lower extremities, improving yesterday Respiratory rate normal, lungs clear without rales or wheezes Abdomen soft no tenderness palpation or guarding Attention normal, affect normal, judgment and insight appear normal    Data Reviewed: Basic metabolic panel shows creatinine down to 1.6, potassium normal Hemoglobin up to 8.7 on CBC Ferritin 20, transferrin saturation greater than 20%, no role for IV iron  Family Communication: Daughter    Disposition: Status is: Inpatient         Author: Alberteen Sam, MD 05/30/2023 3:51 PM  For on call review [email or web address].

## 2023-05-30 NOTE — Progress Notes (Signed)
PT Cancellation Note  Patient Details Name: [name] MRN: [id number] DOB: [birth year]   Cancelled Treatment:    Reason Eval/Treat Not Completed: Fatigue/lethargy limiting ability to participate (pt sitting on commode and stated she's too fatigued to do any therapy this afternoon. Call bell within reach.  Will follow.)   Tamala Ser PT 05/30/2023  Acute Rehabilitation Services  Office [phone]

## 2023-05-30 NOTE — Progress Notes (Signed)
Occupational Therapy Treatment Patient Details Name: [name] MRN: [id number] DOB: 1933-12-06 Today's Date: 05/30/2023   History of present illness 89 y.o. female with hx of diastolic heart failure, pulmonary hypertension, status post AICD, history of GI bleed, history of neuroendocrine pancreatic cancer, chronic resp failure was on 2 L O2 [place], has COPD as well. Patient admitted with worsening LE edema, generalized malaise and fatigue. Dx: acute on chronic CHF exacerbation   OT comments  Pt making good progress with functional goals. Pt reports that she feels better; LEs visibly with less edema and pt also reports decreased B LE pain. Pt sat EOB with no physical assist, CGA to stand to RW and to transfer to [facility]. Pt participated in toileting, bathing, dressing and grooming/hygiene tasks with fatiguing less quickly. O2 SATs remained stable throughout activity/exertion. OT will continue to follow acutely to maximize level of function and safety. Pt hoping to d/c home tomorrow      If plan is discharge home, recommend the following:  A little help with walking and/or transfers;Assistance with cooking/housework;Assist for transportation;A little help with bathing/dressing/bathroom   Equipment Recommendations  None recommended by OT    Recommendations for Other Services      Precautions / Restrictions Precautions Precaution Comments: Poor endurance;O2 dep at baseline Restrictions Weight Bearing Restrictions Per Provider Order: No       Mobility Bed Mobility Overal bed mobility: Needs Assistance Bed Mobility: Supine to Sit     Supine to sit: Supervision     General bed mobility comments: increased time and effort to complete, no physical assist required    Transfers Overall transfer level: Needs assistance Equipment used: Rolling walker (2 wheels) Transfers: Sit to/from Stand Sit to Stand: Contact guard assist     Step pivot transfers: Contact guard assist      General transfer comment: Cues for safety, hand placement     Balance Overall balance assessment: Needs assistance Sitting-balance support: Bilateral upper extremity supported, Feet supported         Standing balance-Leahy Scale: Poor Standing balance comment: pt stood with RW at [facility] for perihygiene and LB bathing tasks                           ADL either performed or assessed with clinical judgement   ADL Overall ADL's : Needs assistance/impaired     Grooming: Wash/dry hands;Wash/dry face;Oral care;Set up;Sitting   Upper Body Bathing: Set up;Sitting   Lower Body Bathing: Minimal assistance;Sit to/from stand   Upper Body Dressing : Set up;Sitting   Lower Body Dressing: Minimal assistance   Toilet Transfer: Contact guard assist;Ambulation;Rolling walker (2 wheels);BSC/3in1;Cueing for safety   Toileting- Clothing Manipulation and Hygiene: Contact guard assist;Sit to/from stand       Functional mobility during ADLs: Contact guard assist;Rolling walker (2 wheels);Cueing for safety      Extremity/Trunk Assessment Upper Extremity Assessment Upper Extremity Assessment: Generalized weakness   Lower Extremity Assessment Lower Extremity Assessment: Defer to PT evaluation   Cervical / Trunk Assessment Cervical / Trunk Assessment: Normal    Vision Ability to See in Adequate Light: 0 Adequate Patient Visual Report: No change from baseline     Perception     Praxis      Cognition Arousal: Alert Behavior During Therapy: WFL for tasks assessed/performed Overall Cognitive Status: Within Functional Limits for tasks assessed  Exercises      Shoulder Instructions       General Comments      Pertinent Vitals/ Pain       Pain Assessment Pain Assessment: 0-10 Pain Score: 3  Pain Location: B LEs Pain Descriptors / Indicators: Discomfort, Sore Pain Intervention(s): Monitored during session,  Repositioned  Home Living                                          Prior Functioning/Environment              Frequency  Min 1X/week        Progress Toward Goals  OT Goals(current goals can now be found in the care plan section)  Progress towards OT goals: Progressing toward goals     Plan      Co-evaluation                 AM-PAC OT "6 Clicks" Daily Activity     Outcome Measure   Help from another person eating meals?: None Help from another person taking care of personal grooming?: A Little Help from another person toileting, which includes using toliet, bedpan, or urinal?: A Little Help from another person bathing (including washing, rinsing, drying)?: A Little Help from another person to put on and taking off regular upper body clothing?: A Little Help from another person to put on and taking off regular lower body clothing?: A Little 6 Click Score: 19    End of Session Equipment Utilized During Treatment: Gait belt;Rolling walker (2 wheels);Other (comment) (BSC)  OT Visit Diagnosis: Unsteadiness on feet (R26.81);Muscle weakness (generalized) (M62.81)   Activity Tolerance Patient tolerated treatment well   Patient Left in chair;with call bell/phone within reach   Nurse Communication Mobility status        Time: 4098-1191 OT Time Calculation (min): 28 min  Charges: OT General Charges $OT Visit: 1 Visit OT Treatments $Self Care/Home Management : 8-22 mins $Therapeutic Activity: 8-22 mins   Galen Manila 05/30/2023, 1:58 PM

## 2023-05-30 NOTE — Plan of Care (Signed)
  Problem: Nutrition: Goal: Adequate nutrition will be maintained Outcome: Progressing   Problem: Pain Managment: Goal: General experience of comfort will improve and/or be controlled Outcome: Progressing   Problem: Safety: Goal: Ability to remain free from injury will improve Outcome: Progressing   Problem: Activity: Goal: Capacity to carry out activities will improve Outcome: Progressing

## 2023-05-31 DIAGNOSIS — I5033 Acute on chronic diastolic (congestive) heart failure: Secondary | ICD-10-CM | POA: Diagnosis not present

## 2023-05-31 LAB — BASIC METABOLIC PANEL
Anion gap: 13 (ref 5–15)
BUN: 71 mg/dL — ABNORMAL HIGH (ref 8–23)
CO2: 25 mmol/L (ref 22–32)
Calcium: 8.4 mg/dL — ABNORMAL LOW (ref 8.9–10.3)
Chloride: 98 mmol/L (ref 98–111)
Creatinine, Ser: 1.81 mg/dL — ABNORMAL HIGH (ref 0.44–1.00)
GFR, Estimated: 27 mL/min — ABNORMAL LOW (ref >60)
Glucose, Bld: 145 mg/dL — ABNORMAL HIGH (ref 70–99)
Potassium: 3.9 mmol/L (ref 3.5–5.1)
Sodium: 136 mmol/L (ref 135–145)

## 2023-05-31 LAB — CBC
HCT: 28.2 % — ABNORMAL LOW (ref 36.0–46.0)
Hemoglobin: 8.1 g/dL — ABNORMAL LOW (ref 12.0–15.0)
MCH: 24.8 pg — ABNORMAL LOW (ref 26.0–34.0)
MCHC: 28.7 g/dL — ABNORMAL LOW (ref 30.0–36.0)
MCV: 86.2 fL (ref 80.0–100.0)
Platelets: 220 10*3/uL (ref 150–400)
RBC: 3.27 MIL/uL — ABNORMAL LOW (ref 3.87–5.11)
RDW: 20.4 % — ABNORMAL HIGH (ref 11.5–15.5)
WBC: 4.2 10*3/uL (ref 4.0–10.5)
nRBC: 0.7 % — ABNORMAL HIGH (ref 0.0–0.2)

## 2023-05-31 MED ORDER — TORSEMIDE 100 MG PO TABS
100.0000 mg | ORAL_TABLET | Freq: Two times a day (BID) | ORAL | Status: DC
  Administered 2023-05-31 (×2): 100 mg via ORAL
  Filled 2023-05-31 (×3): qty 1

## 2023-05-31 NOTE — Progress Notes (Signed)
Attempted to assist pt with ambulation x 3 this shift. Pt has refused each time. Primary RN Gwyneth Sprout RN and Dr Maryfrances Bunnell notified via secure chat.

## 2023-05-31 NOTE — Progress Notes (Signed)
  Progress Note   Patient: [name] TFT:[id number] DOB: 1933-07-14 DOA: 05/27/2023     4 DOS: the patient was seen and examined on 05/31/2023 at 11:18AM      Brief hospital course: 89 y.o. 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            Assessment and Plan: * Acute on chronic diastolic CHF (congestive heart failure) (HCC) Pulmonary hypertension (HCC) Right heart failure ICD (implantable cardioverter-defibrillator) in place Hypertrophic cardiomyopathy (HCC) Net negative 250 yesterday, weights inaccurate. Cr up to 1.8, K normal - Transition to oral Torsemide  - Daily BMP - Supp K - Strict I/Os    Normocytic anemia Hgb relatively stable - Continue supplemental iron  Primary pancreatic neuroendocrine tumor - Outpatient follow up with Oncology  PAD (peripheral artery disease) (HCC) - Hold home Crestor  Hyperlipidemia - Hold Crestor          Subjective: No change, no nursing concerns.  Still somewhat OOB.     Physical Exam: BP (!) 131/51   Pulse (!) 59   Temp 98.1 F (36.7 C) (Oral)   Resp 20   Ht 5\' 6"  (1.676 m)   Wt 63.5 kg   SpO2 100%   BMI 22.60 kg/m   Elderly adult female, sitting up in bed, tired but interactive RRR, soft systolic murmur, JVP elevated, no pitting in legs, but still severe chronic venous ulcersations and hyperkeratotic changes to legs Respiratory rate normal, lungs clear without rales or wheezes Abdomen soft no tenderness palpation or guarding Attention normal, affect normal, judgment and insight appear normal  Data Reviewed: CR up to 1.8 Hgb slightly down to 8.1  Family Communication: Daughter by phone    Disposition: Status is: Inpatient         Author: Alberteen Sam, MD 05/31/2023 4:25 PM  For on  call review [email or web address].

## 2023-05-31 NOTE — Progress Notes (Signed)
Patient refused wound care.

## 2023-05-31 NOTE — Progress Notes (Signed)
Mobility Specialist - Progress Note  ([place] 3L) Pre-mobility: 60 bpm HR, 98% SpO2 Post-mobility: 66 bpm HR, 93% SPO2   05/31/23 0923  Mobility  Activity Ambulated with assistance in room  Level of Assistance Contact guard assist, steadying assist  Assistive Device Front wheel walker  Distance Ambulated (ft) 20 ft  Range of Motion/Exercises Active  Activity Response Tolerated fair  Mobility Referral Yes  Mobility visit 1 Mobility  Mobility Specialist Start Time (ACUTE ONLY) [id number]  Mobility Specialist Stop Time (ACUTE ONLY) [id number]  Mobility Specialist Time Calculation (min) (ACUTE ONLY) 18 min   Pt was found in bed and agreeable to mobilize. Pt grew fatigued with session. States feeling SOB constantly. At EOS returned to bed with all needs met. Call bell in reach.  Billey Chang Mobility Specialist

## 2023-06-01 DIAGNOSIS — I5033 Acute on chronic diastolic (congestive) heart failure: Secondary | ICD-10-CM | POA: Diagnosis not present

## 2023-06-01 LAB — CBC
HCT: 29.3 % — ABNORMAL LOW (ref 36.0–46.0)
Hemoglobin: 8.1 g/dL — ABNORMAL LOW (ref 12.0–15.0)
MCH: 23.5 pg — ABNORMAL LOW (ref 26.0–34.0)
MCHC: 27.6 g/dL — ABNORMAL LOW (ref 30.0–36.0)
MCV: 85.2 fL (ref 80.0–100.0)
Platelets: 220 10*3/uL (ref 150–400)
RBC: 3.44 MIL/uL — ABNORMAL LOW (ref 3.87–5.11)
RDW: 20.9 % — ABNORMAL HIGH (ref 11.5–15.5)
WBC: 4 10*3/uL (ref 4.0–10.5)
nRBC: 0 % (ref 0.0–0.2)

## 2023-06-01 LAB — BASIC METABOLIC PANEL
Anion gap: 12 (ref 5–15)
BUN: 70 mg/dL — ABNORMAL HIGH (ref 8–23)
CO2: 29 mmol/L (ref 22–32)
Calcium: 8.7 mg/dL — ABNORMAL LOW (ref 8.9–10.3)
Chloride: 95 mmol/L — ABNORMAL LOW (ref 98–111)
Creatinine, Ser: 1.89 mg/dL — ABNORMAL HIGH (ref 0.44–1.00)
GFR, Estimated: 25 mL/min — ABNORMAL LOW (ref >60)
Glucose, Bld: 105 mg/dL — ABNORMAL HIGH (ref 70–99)
Potassium: 4.2 mmol/L (ref 3.5–5.1)
Sodium: 136 mmol/L (ref 135–145)

## 2023-06-01 MED ORDER — CEPHALEXIN 500 MG PO CAPS: 500.0000 mg | ORAL_CAPSULE | Freq: Three times a day (TID) | ORAL | 0 refills | Status: AC

## 2023-06-01 MED ORDER — METOLAZONE 2.5 MG PO TABS: 2.5000 mg | ORAL_TABLET | ORAL | 3 refills | Status: DC

## 2023-06-01 NOTE — Discharge Summary (Signed)
Physician Discharge Summary   Patient: [name] MRN: [id number] DOB: [birth year]  Admit date:     05/27/2023  Discharge date: 06/01/23  Discharge Physician: Alberteen Sam   PCP: [name], MD     Recommendations at discharge:  Follow up with Dr. Sharen Hones for CHF, Anemia within 2 weeks Follow up with CHF Clinic on Jan 31  CHF Clinic: Please obtain BMP and CBC (discharge Cr 1.89, Hgb 8.1) Please adjust torsemide 100 BID and metolazone 2.5 weekly as appropriate Please forward Hgb results to Dr. Malachy Mood  Follow up with Dr. Jeannine Boga as soon as able Dr. Jeannine Boga: Please provide or refer the patient for weekly wound care of venous ulcers  Follow up with Oncology Dr. Mosetta Putt for anemia, neuroendocrine carcinoma     Discharge Diagnoses: Principal Problem:   Acute on chronic diastolic CHF (congestive heart failure) (HCC) Active Problems:   Pulmonary hypertension   Acute right heart failure   Hypertrophic cardiomyopathy   Normocytic anemia   Primary pancreatic neuroendocrine tumor   Hyperlipidemia   Atrial fibrillation, permanent (HCC)   COPD (chronic obstructive pulmonary disease) (HCC)   CKD (chronic kidney disease), stage IIIb (HCC)   ICD (implantable cardioverter-defibrillator) in place   PAD (peripheral artery disease) (HCC)   Hyponatremia   Acute on chronic respiratory failure with hypoxia (HCC) Cellulitis       Hospital Course: 89 y.o. 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          * Acute on chronic diastolic CHF (congestive heart failure) (HCC) Acute right heart failure Pulmonary hypertension Hypertrophic cardiomyopathy History of implantable cardiac defibrillator The patient was admitted and started on IV Lasix.  She diuresed 5.2 L, her  swelling improved.  At baseline, she is NYHA stage III-IV, can walk about 20 feet before she is out of breath.  She seems to be slowly progressing.  Resume torsemide 100 twice daily at discharge Reduce metolazone to once weekly Check BMP in 4 days at CHF clinic follow up    Cellulitis venous ulcers The patient had bilateral redness and swelling of pain of both legs.  She was treated with 5 days of cephalexin and discharged to complete 7d total.    - Recommend weekly wound care at a wound care center   Normocytic anemia Primary pancreatic neuroendocrine tumor The patient's hemoglobin here had trended down to 7.1.  She was transfused 1 unit, hemoglobin improved to mid eights. -Recommend close follow-up with oncology for transfusion support     CKD (chronic kidney disease), stage IIIb (HCC) Creatinine was stable here during diuresis, trended up in the last 2 days to 1.8.  Torsemide held 1 day at the time of discharge, will resume and has close follow-up for BMP this weekend.  COPD (chronic obstructive pulmonary disease) (HCC) No wheezing, no evidence of active flare.     Atrial fibrillation, permanent (HCC) Not on anticoagulation, rate controlled            The [facility] Controlled Substances Registry was reviewed for this patient prior to discharge.     Disposition: Home health PT/OT/RN/aide, four-wheel walker Diet recommendation:  Discharge Diet Orders (From admission, onward)     Start     Ordered   06/01/23 0000  Diet - low  sodium heart healthy        06/01/23 1235             DISCHARGE MEDICATION: Allergies as of 06/01/2023       Reactions   Codeine Nausea And Vomiting, Other (See Comments)   Hallucinations, too- CANNOT HAVE; thinks she may have received Narcan to reverse the effect   Dilaudid [hydromorphone] Other (See Comments)   Excessive Somnolence- Required Narcan X2   Tape Other (See Comments)   CAN TOLERATE ONLY EASY-RELEASE, PAPER TAPE,  AS THE SKIN IS VERY THIN AND WILL BRUISE AND TEAR VERY EASILY   Amoxil [amoxicillin] Nausea Only   Asa [aspirin] Other (See Comments)   Told to avoid due to reduced kidney function, has 1 remaining kidney   Ms Contin [morphine] Nausea And Vomiting, Other (See Comments)   Hallucinations, also   Neurontin [gabapentin] Other (See Comments)   "Out of it"   Nsaids Other (See Comments)   Told to avoid due to reduced kidney function, has 1 remaining kidney        Medication List     STOP taking these medications    doxycycline 100 MG capsule Commonly known as: VIBRAMYCIN       TAKE these medications    acetaminophen 500 MG tablet Commonly known as: TYLENOL Take 1,000 mg by mouth every 8 (eight) hours.   B-12 PO Take 1 tablet by mouth daily.   cephALEXin 500 MG capsule Commonly known as: KEFLEX Take 1 capsule (500 mg total) by mouth 3 (three) times daily for 6 doses.   CRANBERRY PO Take 1 tablet by mouth daily.   ferrous gluconate 324 MG tablet Commonly known as: FERGON Take 1 tablet (324 mg total) by mouth daily with breakfast.   fluticasone 50 MCG/ACT nasal spray Commonly known as: FLONASE Place 2 sprays into both nostrils daily as needed for allergies or rhinitis.   HAIR SKIN NAILS PO Take 1 capsule by mouth daily.   metolazone 2.5 MG tablet Commonly known as: ZAROXOLYN Take 1 tablet (2.5 mg total) by mouth once a week. Every Tuesday with 40 meq of potassium What changed:  when to take this additional instructions   pantoprazole 40 MG tablet Commonly known as: PROTONIX Take 1 tablet (40 mg total) by mouth daily. What changed: when to take this   potassium chloride SA 20 MEQ tablet Commonly known as: KLOR-CON M Take 2 tablets (40 mEq total) by mouth every morning AND 1 tablet (20 mEq total) at bedtime. Take extra 2 tabs when you take metolazone. What changed: See the new instructions.   potassium chloride SA 20 MEQ tablet Commonly known as: KLOR-CON  M Take 1 tablet (20 mEq total) by mouth once a week. Weekly with Metolazone dose every Saturday What changed: Another medication with the same name was changed. Make sure you understand how and when to take each.   rosuvastatin 40 MG tablet Commonly known as: CRESTOR Take 1 tablet (40 mg total) by mouth daily.   torsemide 20 MG tablet Commonly known as: DEMADEX Take 5 tablets (100 mg total) by mouth 2 (two) times daily.   traMADol 50 MG tablet Commonly known as: ULTRAM Take 50 mg by mouth every 6 (six) hours as needed (for pain).   VITAMIN C PO Take 1 tablet by mouth daily.   VITAMIN D-3 PO Take 2 capsules by mouth daily.               Durable Medical Equipment  (  From admission, onward)           Start     Ordered   06/01/23 1129  For home use only DME 4 wheeled rolling walker with seat  Once       Question:  Patient needs a walker to treat with the following condition  Answer:  CHF (congestive heart failure) (HCC)   06/01/23 1128              Discharge Care Instructions  (From admission, onward)           Start     Ordered   06/01/23 0000  Discharge wound care:       Comments: As previously directed by Dr. Vassie Moselle   06/01/23 1235            Follow-up Information     [name], MD. Schedule an appointment as soon as possible for a visit in 1 week(s).   Specialty: Family Medicine Contact information: [address] [place] [address] [phone]         Laurey Morale, MD. Go on 06/06/2023.   Specialty: Cardiology Why: At 1:30PM Contact information: [address] [place] [address] [phone]                 Discharge Instructions     (HEART FAILURE PATIENTS) Call MD:  Anytime you have any of the following symptoms: 1) 3 pound weight gain in 24 hours or 5 pounds in 1 week 2) shortness of breath, with or without a dry hacking cough 3) swelling in the hands, feet or stomach 4) if  you have to sleep on extra pillows at night in order to breathe.   Complete by: As directed    Diet - low sodium heart healthy   Complete by: As directed    Discharge instructions   Complete by: As directed    **IMPORTANT DISCHARGE INSTRUCTIONS**   From Dr. Maryfrances Bunnell: You were admitted with leg swelling and pain.   This was due to infection and CHF flare. Here, you were treated with diuretics and we removed 4L of fluid.  You should resume your home torsemide 100 mg twice daily Continue metolazone 2.5 mg once weekly (reduced from twice weekly) on Tuesday  WEIGH YOURSELF daily  If you ever weigh MORE THAN 5 lbs over your "dry weight", call Dr. Alford Highland office Your dry weight is your weight tomorrow morning after treatment in the hospital  Go see Dr. Alford Highland office this week on Friday Ask them to check your labs  Go see Dr. Sharen Hones in 1-2 weeks  There may have been some infection in your legs, so you got antibiotics for it here  Continue 2 more days cephalexin then stop Go see your skin doctor Dr. Jeannine Boga as soon as able and ask him for wound care recommendations  Go see Dr. Mosetta Putt as scheduled    CHECKLIST: - Call Dr. Nicanor Alcon office tomorrow (Monday Jan 27) to schedule a hospital follow up appointment - Call Dr. Lowry Bowl office tomorrow also, to schedule a follow up appointment - Ask Dr. Jeannine Boga and/or Dr. Sharen Hones for a referral to a wound care specialist, if Dr. Jeannine Boga isn't able to manage this himself - Go see Dr. Alford Highland office on Friday Jan 31 - They will draw labs (kidney function and hemoglobin level) - Call Dr. Latanya Maudlin office on Monday Feb 3 to ask them what you should do about your hemoglobin level on Fri Jan  31   Discharge wound care:   Complete by: As directed    As previously directed by Dr. Vassie Moselle   Increase activity slowly   Complete by: As directed        Discharge Exam: Filed Weights   05/30/23 0500 05/31/23 0500 06/01/23 0500  Weight: 64.1 kg  63.5 kg 63.6 kg    General: Pt is alert, awake, not in acute distress, lying in bed, appears tired and out of breath at rest Cardiovascular: RRR, nl S1-S2, no murmurs appreciated lower extremity edema has resolved.  She has severe chronic venous ulcers. Respiratory: Normal respiratory rate and rhythm.  CTAB without rales or wheezes. Abdominal: Abdomen soft and non-tender.  No distension or HSM.   Neuro/Psych: Strength symmetric in upper and lower extremities.  Judgment and insight appear normal.  Speech fluent, face symmetric.   Condition at discharge: fair  The results of significant diagnostics from this hospitalization (including imaging, microbiology, ancillary and laboratory) are listed below for reference.   Imaging Studies: ECHOCARDIOGRAM COMPLETE Result Date: 05/28/2023    ECHOCARDIOGRAM REPORT   Patient Name:   JEARLEAN DEMAURO Date of Exam: 05/28/2023 Medical Rec #:  [id number]         Height:       66.0 in Accession #:    [id number]        Weight:       140.0 lb Date of Birth:  [birth year]         BSA:          1.718 m Patient Age:    89 years          BP:           128/61 mmHg Patient Gender: F                 HR:           65 bpm. Exam Location:  Inpatient Procedure: 2D Echo, Cardiac Doppler and Color Doppler Indications:    I50.40* Unspecified combined systolic (congestive) and diastolic                 (congestive) heart failure  History:        Patient has prior history of Echocardiogram examinations, most                 recent 01/16/2023. Abnormal ECG and Defibrillator, COPD, Aortic                 Valve Disease and Mitral Valve Disease, Arrythmias:Atrial                 Fibrillation; Risk Factors:Dyslipidemia and Hypertension. HOCM.                 Cancer. Moderate mitral stenosis. Moderate aortic stenosis.  Sonographer:    Sheralyn Boatman RDCS Referring Phys: [id number] [facility] [place]OK[place]REL IMPRESSIONS  1. Left ventricular ejection fraction, by estimation, is 65 to 70%. The left ventricle has  normal function. The left ventricle has no regional wall motion abnormalities. There is mild left ventricular hypertrophy. Left ventricular diastolic parameters are indeterminate.  2. Right ventricular systolic function is moderately reduced. The right ventricular size is moderately enlarged. There is severely elevated pulmonary artery systolic pressure. The estimated right ventricular systolic pressure is 83.9 mmHg.  3. Left atrial size was mildly dilated.  4. Right atrial size was moderately dilated.  5. The mitral valve is degenerative. Mild mitral valve regurgitation. Moderate mitral stenosis. The mean mitral valve  gradient is 6.0 mmHg with average heart rate of 60 bpm. MVA 1.3 cm^2 by continuity equation. Moderate mitral annular calcification.  6. Tricuspid valve regurgitation is severe.  7. The aortic valve is tricuspid. There is moderate calcification of the aortic valve. Aortic valve regurgitation is trivial. Moderate aortic valve stenosis. Vmax 2.8 m/s, MG 17 mmHg, AVA 1.1 cm^2, DI 0.31  8. The inferior vena cava is dilated in size with <50% respiratory variability, suggesting right atrial pressure of 15 mmHg. FINDINGS  Left Ventricle: Left ventricular ejection fraction, by estimation, is 65 to 70%. The left ventricle has normal function. The left ventricle has no regional wall motion abnormalities. The left ventricular internal cavity size was normal in size. There is  mild left ventricular hypertrophy. Left ventricular diastolic parameters are indeterminate. Right Ventricle: The right ventricular size is moderately enlarged. No increase in right ventricular wall thickness. Right ventricular systolic function is moderately reduced. There is severely elevated pulmonary artery systolic pressure. The tricuspid regurgitant velocity is 4.15 m/s, and with an assumed right atrial pressure of 15 mmHg, the estimated right ventricular systolic pressure is 83.9 mmHg. Left Atrium: Left atrial size was mildly dilated.  Right Atrium: Right atrial size was moderately dilated. Pericardium: There is no evidence of pericardial effusion. Mitral Valve: The mitral valve is degenerative in appearance. Moderate mitral annular calcification. Mild mitral valve regurgitation. Moderate mitral valve stenosis. MV peak gradient, 23.8 mmHg. The mean mitral valve gradient is 6.0 mmHg with average heart rate of 60 bpm. Tricuspid Valve: The tricuspid valve is grossly normal. Tricuspid valve regurgitation is severe. Aortic Valve: The aortic valve is tricuspid. There is moderate calcification of the aortic valve. Aortic valve regurgitation is trivial. Aortic regurgitation PHT measures 441 msec. Moderate aortic stenosis is present. Aortic valve mean gradient measures 17.0 mmHg. Aortic valve peak gradient measures 28.2 mmHg. Aortic valve area, by VTI measures 1.23 cm. Pulmonic Valve: The pulmonic valve was grossly normal. Pulmonic valve regurgitation is mild. No evidence of pulmonic stenosis. Aorta: The aortic root and ascending aorta are structurally normal, with no evidence of dilitation. Venous: The inferior vena cava is dilated in size with less than 50% respiratory variability, suggesting right atrial pressure of 15 mmHg. IAS/Shunts: No atrial level shunt detected by color flow Doppler. Additional Comments: A device lead is visualized in the right ventricle and right atrium.  LEFT VENTRICLE PLAX 2D LVIDd:         3.70 cm     Diastology LVIDs:         2.40 cm     LV e' medial:    6.64 cm/s LV PW:         1.30 cm     LV E/e' medial:  28.0 LV IVS:        1.20 cm     LV e' lateral:   6.64 cm/s LVOT diam:     2.10 cm     LV E/e' lateral: 28.0 LV SV:         73 LV SV Index:   42 LVOT Area:     3.46 cm  LV Volumes (MOD) LV vol d, MOD A2C: 62.9 ml LV vol d, MOD A4C: 41.6 ml LV vol s, MOD A2C: 13.4 ml LV vol s, MOD A4C: 14.3 ml LV SV MOD A2C:     49.5 ml LV SV MOD A4C:     41.6 ml LV SV MOD BP:      37.3 ml RIGHT VENTRICLE  IVC RV S prime:      6.59 cm/s  IVC diam: 2.80 cm TAPSE (M-mode): 1.1 cm LEFT ATRIUM             Index        RIGHT ATRIUM           Index LA Vol (A2C):   56.1 ml 32.65 ml/m  RA Area:     24.30 cm LA Vol (A4C):   64.0 ml 37.24 ml/m  RA Volume:   75.90 ml  44.17 ml/m LA Biplane Vol: 60.9 ml 35.44 ml/m  AORTIC VALVE                     PULMONIC VALVE AV Area (Vmax):    1.22 cm      PR End Diast Vel: 2.25 msec AV Area (Vmean):   1.20 cm AV Area (VTI):     1.23 cm AV Vmax:           265.75 cm/s AV Vmean:          179.500 cm/s AV VTI:            0.592 m AV Peak Grad:      28.2 mmHg AV Mean Grad:      17.0 mmHg LVOT Vmax:         93.30 cm/s LVOT Vmean:        62.400 cm/s LVOT VTI:          0.210 m LVOT/AV VTI ratio: 0.35 AI PHT:            441 msec  AORTA Ao Root diam: 2.90 cm Ao Asc diam:  3.10 cm MITRAL VALVE                TRICUSPID VALVE MV Area (PHT): 4.31 cm     TV Peak grad:   17.8 mmHg MV Area VTI:   1.24 cm     TV Mean grad:   4.0 mmHg MV Peak grad:  23.8 mmHg    TV Vmax:        2.11 m/s MV Mean grad:  6.0 mmHg     TV Vmean:       85.4 cm/s MV Vmax:       2.44 m/s     TV VTI:         0.51 msec MV Vmean:      100.2 cm/s   TR Peak grad:   68.9 mmHg MV Decel Time: 176 msec     TR Vmax:        415.00 cm/s MV E velocity: 186.00 cm/s                             SHUNTS                             Systemic VTI:  0.21 m                             Systemic Diam: 2.10 cm Epifanio Lesches MD Electronically signed by Epifanio Lesches MD Signature Date/Time: 05/28/2023/6:29:29 PM    Final    DG Chest Portable 1 View Result Date: 05/27/2023 CLINICAL DATA:  Shortness of breath EXAM: PORTABLE CHEST 1 VIEW COMPARISON:  01/30/2023 FINDINGS: Left-sided pacing device as before. Cardiomegaly with similar sized pleural effusions. Mild  vascular congestion. Aortic atherosclerosis. No pneumothorax IMPRESSION: Cardiomegaly with vascular congestion and similar sized pleural effusions. Overall no significant change compared to prior  Electronically Signed   By: Jasmine Pang M.D.   On: 05/27/2023 16:17    Microbiology: Results for orders placed or performed during the hospital encounter of 08/21/22  Urine Culture     Status: None   Collection Time: 08/21/22 11:32 AM   Specimen: Urine, Random  Result Value Ref Range Status   Specimen Description URINE, RANDOM  Final   Special Requests NONE  Final   Culture   Final    NO GROWTH Performed at [facility], [address], [place], [place] [address]    Report Status 08/22/2022 FINAL  Final   *Note: Due to a large number of results and/or encounters for the requested time period, some results have not been displayed. A complete set of results can be found in Results Review.    Labs: CBC: Recent Labs  Lab 05/27/23 1723 05/28/23 0411 05/29/23 0403 05/30/23 0358 05/31/23 0451 06/01/23 0417  WBC 6.7 6.4 5.1 7.3 4.2 4.0  NEUTROABS 5.7  --   --   --   --   --   HGB 7.5* 7.4* 7.1* 8.7* 8.1* 8.1*  HCT 26.4* 25.9* 26.6* 30.3* 28.2* 29.3*  MCV 83.8 83.3 87.5 84.6 86.2 85.2  PLT 251 265 237 254 220 220   Basic Metabolic Panel: Recent Labs  Lab 05/28/23 0411 05/29/23 0403 05/30/23 0358 05/31/23 0451 06/01/23 0417  NA 132* 136 134* 136 136  K 3.6 3.9 4.2 3.9 4.2  CL 98 98 96* 98 95*  CO2 26 28 26 25 29   GLUCOSE 172* 115* 150* 145* 105*  BUN 66* 67* 64* 71* 70*  CREATININE 1.58* 1.70* 1.60* 1.81* 1.89*  CALCIUM 8.7* 8.9 8.8* 8.4* 8.7*  MG 2.1  --   --   --   --   PHOS 3.8  --   --   --   --    Liver Function Tests: Recent Labs  Lab 05/27/23 1723 05/29/23 0403 05/30/23 0358  AST 26 15 18   ALT 12 11 10   ALKPHOS 66 57 62  BILITOT 1.2 0.6 1.6*  PROT 7.5 7.0 7.5  ALBUMIN 3.3* 2.9* 3.0*   CBG: No results for input(s): "GLUCAP" in the last 168 hours.  Discharge time spent: approximately 45 minutes spent on discharge counseling, evaluation of patient on day of discharge, and coordination of discharge planning with nursing, social work, pharmacy  and case management  Signed: Alberteen Sam, MD [facility] 06/01/2023

## 2023-06-01 NOTE — TOC Transition Note (Addendum)
Transition of Care [facility]) - Discharge Note   Patient Details  Name: [name] MRN: [id number] Date of Birth: [birth year]  [facility]) CM/SW Contact:  Darleene Cleaver, LCSW Phone Number: 06/01/2023, 11:34 AM   Clinical Narrative:     Patient will be going home with home health through [facility].  TOC signing off please reconsult with any other TOC needs, home health agency has been notified of planned discharge.  Rollator was ordered through [employer], it will be delivered to patient's room prior to discharge.  4:10 CSW was informed that rollator has not been delivered to patient yet. CSW contacted Aida at [employer], and she said the driver is loading up now and should be there in a few minutes.  CSW updated bedside nurse.   Final next level of care: Home w Home Health Services Barriers to Discharge: Barriers Resolved   Patient Goals and CMS Choice Patient states their goals for this hospitalization and ongoing recovery are:: To return back home with home health. CMS Medicare.gov Compare Post Acute Care list provided to:: Patient Choice offered to / list presented to : Patient [place] ownership interest in [facility].provided to:: Patient    Discharge Placement  Home with home health.                     Discharge Plan and Services Additional resources added to the After Visit Summary for                  DME Arranged: Walker rolling with seat DME Agency: [employer] Date DME Agency Contacted: 06/01/23 Time DME Agency Contacted: 1133 Representative spoke with at DME Agency: Aida HH Arranged: PT, OT, RN, Social Work, Nurse's Aide HH Agency: [facility] Date HH Agency Contacted: 05/29/23 Time HH Agency Contacted: 1134 Representative spoke with at [facility] Agency: Rivka Barbara  Social Drivers of Health (SDOH) Interventions SDOH Screenings   Food Insecurity: No Food Insecurity (05/27/2023)  Housing: Low Risk  (05/27/2023)   Transportation Needs: No Transportation Needs (05/27/2023)  Utilities: Not At Risk (05/27/2023)  Depression (PHQ2-9): Low Risk  (02/24/2023)  Financial Resource Strain: Low Risk  (05/28/2022)  Physical Activity: Inactive (09/24/2021)  Social Connections: Moderately Isolated (05/28/2023)  Stress: No Stress Concern Present (09/24/2021)  Tobacco Use: Medium Risk (05/27/2023)     Readmission Risk Interventions    01/31/2023    3:53 PM 11/09/2022   12:00 PM 02/15/2021   11:38 AM  Readmission Risk Prevention Plan  Transportation Screening Complete Complete Complete  PCP or Specialist Appt within 5-7 Days   Complete  PCP or Specialist Appt within 3-5 Days  Complete   Home Care Screening   Complete  Medication Review (RN CM)   Complete  HRI or Home Care Consult Complete Complete   Social Work Consult for Recovery Care Planning/Counseling  Complete   Palliative Care Screening Not Applicable    Medication Review Oceanographer) Complete Complete

## 2023-06-01 NOTE — Plan of Care (Signed)

## 2023-06-01 NOTE — Plan of Care (Signed)
  Problem: Clinical Measurements: Goal: Diagnostic test results will improve Outcome: Progressing Goal: Respiratory complications will improve Outcome: Progressing Goal: Cardiovascular complication will be avoided Outcome: Progressing   Problem: Elimination: Goal: Will not experience complications related to bowel motility Outcome: Progressing   Problem: Pain Managment: Goal: General experience of comfort will improve and/or be controlled Outcome: Progressing   Problem: Safety: Goal: Ability to remain free from injury will improve Outcome: Progressing

## 2023-06-02 ENCOUNTER — Telehealth: Payer: Self-pay

## 2023-06-02 NOTE — Transitions of Care (Post Inpatient/ED Visit) (Signed)
06/02/2023  Name: [name] MRN: [id number] DOB: [birth year]  Today's TOC FU Call Status: Today's TOC FU Call Status:: Successful TOC FU Call Completed TOC FU Call Complete Date: 06/02/23 Patient's Name and Date of Birth confirmed.  Transition Care Management Follow-up Telephone Call Date of Discharge: 06/01/23 Discharge Facility: [facility]) Type of Discharge: Inpatient Admission Primary Inpatient Discharge Diagnosis:: CHF How have you been since you were released from the hospital?: Same Any questions or concerns?: No  Items Reviewed: Did you receive and understand the discharge instructions provided?: Yes Medications obtained,verified, and reconciled?: Yes (Medications Reviewed) Any new allergies since your discharge?: No Dietary orders reviewed?: Yes Type of Diet Ordered:: Low sodium, Heart Healthy Do you have support at home?: Yes People in Home: grandchild(ren) Name of Support/Comfort Primary Source: Carmie Kanner, daughter  Medications Reviewed Today: Medications Reviewed Today     Reviewed by Redge Gainer, RN (Case Manager) on 06/02/23 at 1516  Med List Status: <None>   Medication Order Taking? Sig Documenting Provider Last Dose Status Informant  acetaminophen (TYLENOL) 500 MG tablet [id number] No Take 1,000 mg by mouth every 8 (eight) hours. [provider] 05/26/2023 Active Self  Ascorbic Acid (VITAMIN C PO) [id number] No Take 1 tablet by mouth daily. [provider] Past Week Active Self           Med Note Antony Madura, Arn Medal   Tue May 27, 2023  6:20 PM) Strength not noted  cephALEXin (KEFLEX) 500 MG capsule [id number]  Take 1 capsule (500 mg total) by mouth 3 (three) times daily for 6 doses. Alberteen Sam, MD  Active   Cholecalciferol (VITAMIN D-3 PO) [id number] No Take 2 capsules by mouth daily. [provider] Past Week Active Self           Med Note Antony Madura, Arn Medal   Tue May 27, 2023  6:20 PM) Strength not noted   CRANBERRY PO [id number] No Take 1 tablet by mouth daily. [provider] Past Week Active Self           Med Note Antony Madura, Arn Medal   Tue May 27, 2023  6:20 PM) Strength not noted  Cyanocobalamin (B-12 PO) [id number] No Take 1 tablet by mouth daily. [provider] Past Week Active Self           Med Note Antony Madura, Arn Medal   Tue May 27, 2023  6:21 PM) Strength not noted  ferrous gluconate (FERGON) 324 MG tablet [id number] No Take 1 tablet (324 mg total) by mouth daily with breakfast. Rai, Ripudeep K, MD 05/26/2023 Active Self  fluticasone (FLONASE) 50 MCG/ACT nasal spray [id number] No Place 2 sprays into both nostrils daily as needed for allergies or rhinitis. [name], MD Taking Active Self  metolazone (ZAROXOLYN) 2.5 MG tablet [id number]  Take 1 tablet (2.5 mg total) by mouth once a week. Every Tuesday with 40 meq of potassium Danford, Earl Lites, MD  Active   Multiple Vitamins-Minerals (HAIR SKIN NAILS PO) [id number] No Take 1 capsule by mouth daily. [provider] Past Week Active Self  pantoprazole (PROTONIX) 40 MG tablet [id number] No Take 1 tablet (40 mg total) by mouth daily.  Patient taking differently: Take 40 mg by mouth daily before breakfast.   Cathren Harsh, MD 05/26/2023 Morning Active Self           Med Note Suezanne Cheshire   Tue Apr 15, 2023 12:13 PM)    potassium chloride SA (KLOR-CON M)  20 MEQ tablet [id number] No Take 2 tablets (40 mEq total) by mouth every morning AND 1 tablet (20 mEq total) at bedtime. Take extra 2 tabs when you take metolazone.  Patient taking differently: Take 20 mEq by mouth in the morning and evening and an additional 20 mEq once daily on Tues/Sat only   Bensimhon, Bevelyn Buckles, MD 05/26/2023 Active Self  potassium chloride SA (KLOR-CON M) 20 MEQ tablet [id number] No Take 1 tablet (20 mEq total) by mouth once a week. Weekly with Metolazone dose every Saturday  Patient not taking: Reported on 05/27/2023   Alen Bleacher, NP Not  Taking Active Self  rosuvastatin (CRESTOR) 40 MG tablet [id number] No Take 1 tablet (40 mg total) by mouth daily.  Patient not taking: Reported on 05/27/2023   [name], MD Not Taking Active Self           Med Note Suezanne Cheshire   Tue Apr 15, 2023 12:13 PM)    torsemide (DEMADEX) 20 MG tablet [id number] No Take 5 tablets (100 mg total) by mouth 2 (two) times daily. [place], Eric J, DO 05/27/2023 Morning Active Self           Med Note Antony Madura, KATHY N   Tue May 27, 2023  6:07 PM) The patient took only 20 mg this morning  traMADol (ULTRAM) 50 MG tablet [id number] No Take 50 mg by mouth every 6 (six) hours as needed (for pain). [provider] Taking Active Self            Home Care and Equipment/Supplies: Were Home Health Services Ordered?: Yes Name of Home Health Agency:: [facility] Has Agency set up a time to come to your home?: No EMR reviewed for Home Health Orders: Orders present/patient has not received call (refer to CM for follow-up) (The agency is aware of the discharge but has not set up a time) Any new equipment or medical supplies ordered?: Yes Name of Medical supply agency?: [facility] Were you able to get the equipment/medical supplies?: Yes Do you have any questions related to the use of the equipment/supplies?: No  Functional Questionnaire: Do you need assistance with bathing/showering or dressing?: No Do you need assistance with meal preparation?: Yes Do you need assistance with eating?: No Do you have difficulty maintaining continence: Yes Do you need assistance with getting out of bed/getting out of a chair/moving?: No Do you have difficulty managing or taking your medications?: No  Follow up appointments reviewed: PCP Follow-up appointment confirmed?: Yes Date of PCP follow-up appointment?: 06/04/23 Follow-up Provider: Dr. Sharen Hones Specialist [facility] Follow-up appointment confirmed?: Yes Date of Specialist follow-up appointment?:  06/06/23 Follow-Up Specialty Provider:: Marca Ancona Do you need transportation to your follow-up appointment?: No Do you understand care options if your condition(s) worsen?: Yes-patient verbalized understanding  SDOH Interventions Today    Flowsheet Row Most Recent Value  SDOH Interventions   Food Insecurity Interventions Intervention Not Indicated  Housing Interventions Intervention Not Indicated  Transportation Interventions Intervention Not Indicated  Utilities Interventions Intervention Not Indicated  Social Connections Interventions Intervention Not Indicated      Interventions Today    Flowsheet Row Most Recent Value  Chronic Disease   Chronic disease during today's visit Congestive Heart Failure (CHF)  General Interventions   General Interventions Discussed/Reviewed General Interventions Discussed  Doctor Visits Discussed/Reviewed Doctor Visits Discussed  Exercise Interventions   Exercise Discussed/Reviewed Physical Activity  Physical Activity Discussed/Reviewed Physical Activity Discussed  Education Interventions   Education Provided Provided Education  Provided  Verbal Education On Nutrition, Medication, When to see the doctor, Insurance Plans  Nutrition Interventions   Nutrition Discussed/Reviewed Decreasing salt  Pharmacy Interventions   Pharmacy Dicussed/Reviewed Medications and their functions  Safety Interventions   Safety Discussed/Reviewed Fall Risk  Advanced Directive Interventions   Advanced Directives Discussed/Reviewed Advanced Directives Discussed, Advanced Directives Reviewed, End of Life  End of Life Palliative      TOC Outreach to the patient today. She was hospitalized for CHF which is chronic for her. While in the hospital 4L of fluid was removed. She feels better but states her feet are still swollen and they weep. She has a Melanoma on her foot that she has to have dug out again. She has a wound and goes to the wound clinic. The wound is painful  at times. SN has also been arranged. The patient states she is ambulatory with a walker and uses a wheelchair for long distances. She is also incontinent and wears adult depends. She lives with her grandson who is an EMT and he helps with meal preparation and supervision. We discussed end of life care and goals of care and the patient is established with Palliative Care. She states her body is tired and that she knows about Hospice but she isn't quite ready for that although she is a DNR. She is currently established with [facility] and will continue with her follow up.   Deidre Ala, BSN, RN [place]  VBCI - [employer] [facility] RN Care Manager [phone]

## 2023-06-02 NOTE — Consult Note (Addendum)
[facility]Based [facility] Liaison Consult Note   06/02/2023  [name] Nov 03, 1933 [id number]  [facility] Patient:  Active with VBCI RN CC prior to admission, patient was remotely reviewed for hospitalization for patient that was admitted to [facility].  Primary Care Provider:  [name], MD with [facility], this provider is listed to provide the community transition of care follow up and VBCI TOC calls  Insurance: [employer]  .  Patient is currently active with [facility] for care coordination services.  Patient has been engaged by a [employer].  The community based plan of care has focused on disease management and community resource support.    Patient will receive a post hospital call anticipated from [facility] team, and will be evaluated for assessments and disease process education.    Plan: Continue to follow for any additional community care coordination needs for post hospital/community needs. Will update VBCI TOC RN CC of admission and disposition home with HH.   Of note, [facility] services does not replace or interfere with any services that are needed or arranged by inpatient [facility] care management team.   Charlesetta Shanks, RN, BSN, CCM [place]  [facility], [facility] Liaison Direct Dial: [phone] or secure chat Email: [email or web address]

## 2023-06-04 ENCOUNTER — Inpatient Hospital Stay: Payer: PPO | Admitting: Family Medicine

## 2023-06-06 ENCOUNTER — Inpatient Hospital Stay (HOSPITAL_COMMUNITY)
Admission: RE | Admit: 2023-06-06 | Discharge: 2023-06-06 | Disposition: A | Discharge status: Home / Self Care | Payer: PPO | Source: Ambulatory Visit

## 2023-06-06 NOTE — Progress Notes (Incomplete)
ADVANCED HF CLINIC NOTE   ID:  [name], DOB 1934/02/22, MRN [id number]   PCP:  [name], MD    Cardiologist:  Sherryl Manges, MD Primary HF: Dr. Shirlee Latch   History of Present Illness: 89 y.o. with history of permanent atrial fibrillation, chronic diastolic CHF, CKD stage 3, smoking/prior COPD, renal cell CA s/p right nephrectomy.  She has a prior history of HFrEF with nonischemic cardiomyopathy and had a Medtronic ICD placed.  However, subsequently her EF has recovered.  More recently, she has had symptomatic HFpEF.    6/20, she had left hip ORIF after mechanical fall.  Echo in 6/20 showed EF 60-65%, mild RV dilation with normal systolic function, PASP 93 mmHg.  After she got home from the hospital stay post-ORIF, she noted marked peripheral edema.  She was short of breath walking short distances.  +orthopnea.  She came back to the hospital on 11/04/18 and was admitted with volume overload.  She was diuresed aggressively and lost about 22 lbs.  RHC showed pulmonary venous hypertension.  PCWP tracing had prominent v-waves in absence of significant MR, suggesting significant diastolic dysfunction.   She has been noted to have significant Fe deficiency anemia, but FOBT was negative during 7/20 hospitalization.   Admitted 11/20 with symptomatic anemia, GI workup concerning for ischemic bowel.  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 Durwin Nora with [facility] and conservative management recommended.  Capsule endoscopy showed no definite source of bleeding.  She was volume overloaded due to significant RV dysfunction and diuresed, she also developed AKI on CKD stage 3.      10/22, she had bilateral CIA stents placed. ABIs in 11/22 showed patent stents.  She had a skin cancer removed from her left leg. 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   Echo 2/23 EF 65-70% with mild LVH, D-shaped septum with moderate RV enlargement, and mildly  decreased RV function, PASP 92, moderate-severe TR, dilated IVC, moderate aortic stenosis.  Echo in 10/23 showed EF 60-65%, D-shaped septum, mild RV enlargement with normal RV systolic function, PASP 90, moderate-severe TR, mild AS, IVC dilated.   Patient was found to have a neuroendocrine tumor of the pancreatic head in the fall of 2023.  She has been treated with sandostatin monthly.  She also had further GI bleeding from small bowel AVMs in fall 2023.  She also had COVID-19 in 10/23 and has been on home oxygen 2L since that time.   Follow up with Dr. Graciela Husbands, long discussion about EOL. Given DNR form and high-voltage therapies inactivated on ICD.  Admit 07/24 with acute on chronic blood loss anemia 2/2 GI bleed. Transfused and given IV iron. Had small bowel enteroscopy which showed 2 nonbleeding angiodysplastic lesions in duodenum and 3 nonbleeding angioplastic lesions in jejunum treated with APC.  Admitted early 9/24 with acute on chronic anemia suspected 2/2 chronic GI blood loss. Hgb down to 6.8. Received 2 u RBCs. GI did not recommend any endoscopic procedures d/t advanced age and comorbidities. Course further c/b acute on chronic CHF. She was diuresed with IV lasix and metolazone. Echo during admit with LVEF 65-70%, D-shaped septum, mildly reduced RV, RVSP 103 mmHg, biatrial enlargement, severe TR, moderate mitral stenosis with mean gradient of 6 mmHg, dilated IVC.  She was seen back in the [facility] on 01/30[facility]23 for post hospital f/u and noted recurrent melena, SOB and fatigued w/ minimal exertion. Also noted to have recurrent volume  overload w/ marked return of LEE. Hgb was down to 6.6. She was sent back to the ED and readmitted again for a/c CHF and GIB, treated w/ blood transfusions, IV Fe and diuretics. Discharged on 02/06/23 on torsemide 100 mg bid.   Today she returns for HF follow up with her niece. Overall feeling tired. She has noticed more swelling in her legs and is noted to have skin cancer to  the L leg, which she is being seen for in January. Reports SOB with activity. Intermittent postual dizziness upon standing. Occasional pain in L side when moves her L arm, but no exertional CP.  Sleeps reclined on couch most nights. No fever or chills. Taking all medications.     S/p 2u RBCs 04/26/23 for symptomatic anemia. Hgb 6.8.   Plan: assess volume Today she returns for HF follow up. Overall feeling fine. Denies increasing SOB, CP, dizziness, edema, or PND/Orthopnea. Appetite ok. No fever or chills. Weight at home 170 pounds. Taking all medications.     Cardiomems on 04/15/23: 24, goal 17  (personally reviewed)***  Labs (10/23): magnesium 1.7 Labs (3/24): TSH 2.93 Labs (4/24): K 3.9, creatinine 1.69, ALT 9, hgb 10, Plt 175 Labs (8/24): K 4.5, creatinine, 1.6 Labs (10/24): K 4.1, creatinine 1.55, Hgb 7.6 Labs (11/24): K 4.6, SCr 1.52 Labs (11/24): K 4.6, SCr 1.82 => 1.78, TSat 3, ferritin 16 Labs (12/24): K 3.9, creatinine 1.83  PMH: 1. COPD 2. Renal cell carcinoma: S/p right kidney resection.  3. CKD stage 3 4. Fe deficiency anemia 5. Atrial fibrillation: Permanent. She has failed amiodarone, Tikosyn, and flecainide as well as multiple cardioversions. 6. Chronic diastolic CHF: She has history of prior HFrEF with nonischemic cardiomyopathy.  She has a Medtronic ICD.   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  - More recently, EF has been in normal range but she has had diastolic CHF.  - Echo (6/20): EF 60-65%, mildly dilated RV with PASP 93 mmHg, mild-moderate TR.  - RHC (7/20): mean RA 11, PA 64/18 mean 39, mean PCWP 24 with v waves to 41, CI 4.21, PVR 1.9 WU.  - Cardiomems placement - RHC (11/20): mean RA 13, PA 63/23 mean 25 with v waves to 47 (MR only mild by echo), mean PCWP 25, CI 3.18, PVR 2.6 WU - Echo (11/21): EF 65-70%, moderate LVH, D-shaped septum with mildly decreased RV systolic function, RVSP 85.9 mmHg, severe RAE, severe TR, mod-sev AS. - Echo (2/23): EF  65-70% with mild LVH, D-shaped septum with moderate RV enlargement, and mildly decreased RV function, PASP 92, moderate-severe TR, dilated IVC, moderate aortic stenosis.  - Echo (10/23): EF 60-65%, D-shaped septum, mild RV enlargement with normal RV systolic function, PASP 90, moderate-severe TR, mild AS, IVC dilated.  - Echo (9/24): LVEF 65-70%, D-shaped septum, mildly reduced RV, RVSP 103 mmHg, biatrial enlargement, severe TR, moderate mitral stenosis with mean gradient of 6 mmHg, dilated IVC 7. Hyperlipidemia 8. Carotid stenosis: Carotid dopplers (3/19) with 40-59% LICA stenosis.   9. Possible ischemic bowel in 11/20 10. 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 Conservative management per Dr. Durwin Nora.  - 10/22 bilateral CIA stents.  - 11/22 ABIs: 1.07 left, 0.83 right with patent CIA stents.  11. 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  12. Squamous cell skin cancer.  13. Aortic stenosis: Mild on 10/23 echo.  14. 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  15. COVID 19 in 10/23  16. Mitral stenosis: Echo in 9/24 with moderate MS, mean gradient 6 mmHg with MVA 1.5 cm^2.  17. Tricuspid regurgitation: Severe TR.      Current Outpatient Medications on File Prior to Visit  Medication Sig Dispense Refill   acetaminophen (TYLENOL) 500 MG tablet Take 1,000 mg by mouth every 8 (eight) hours.     Ascorbic Acid (VITAMIN C PO) Take 1 tablet by mouth daily.     Cholecalciferol (VITAMIN D-3 PO) Take 2 capsules by mouth daily.     CRANBERRY PO Take 1 tablet by mouth daily.     Cyanocobalamin (B-12 PO) Take 1 tablet by mouth daily.     ferrous gluconate (FERGON) 324 MG tablet Take 1 tablet (324 mg total) by mouth daily with breakfast. 30 tablet 3   fluticasone (FLONASE) 50 MCG/ACT nasal spray Place 2 sprays into both nostrils daily as needed for allergies or rhinitis. 16 g 0   metolazone (ZAROXOLYN) 2.5 MG tablet Take 1  tablet (2.5 mg total) by mouth once a week. Every Tuesday with 40 meq of potassium 90 tablet 3   Multiple Vitamins-Minerals (HAIR SKIN NAILS PO) Take 1 capsule by mouth daily.     pantoprazole (PROTONIX) 40 MG tablet Take 1 tablet (40 mg total) by mouth daily. (Patient taking differently: Take 40 mg by mouth daily before breakfast.) 30 tablet 3   potassium chloride SA (KLOR-CON M) 20 MEQ tablet Take 2 tablets (40 mEq total) by mouth every morning AND 1 tablet (20 mEq total) at bedtime. Take extra 2 tabs when you take metolazone. (Patient taking differently: Take 20 mEq by mouth in the morning and evening and an additional 20 mEq once daily on Tues/Sat only) 110 tablet 6   potassium chloride SA (KLOR-CON M) 20 MEQ tablet Take 1 tablet (20 mEq total) by mouth once a week. Weekly with Metolazone dose every Saturday (Patient not taking: Reported on 05/27/2023) 15 tablet 6   rosuvastatin (CRESTOR) 40 MG tablet Take 1 tablet (40 mg total) by mouth daily. (Patient not taking: Reported on 05/27/2023) 30 tablet 11   torsemide (DEMADEX) 20 MG tablet Take 5 tablets (100 mg total) by mouth 2 (two) times daily.     traMADol (ULTRAM) 50 MG tablet Take 50 mg by mouth every 6 (six) hours as needed (for pain).     No current facility-administered medications on file prior to visit.     Allergies:   Dilaudid [hydromorphone], Fentanyl, Codeine, Morphine and related, Amoxicillin, Hydrocodone, Oxycodone, Tramadol, Whole blood, and Sulfonamide derivatives    Social History:  The patient  reports that she quit smoking about 21 years ago. Her smoking use included cigarettes. She has a 20.00 pack-year smoking history. She has never used smokeless tobacco. She reports that she does not drink alcohol and does not use drugs.    Family History:  The patient's family history includes Breast cancer in her cousin and another family member; Breast cancer (age of onset: 71) in her maternal aunt; Early death in her father; Heart failure  in her mother.    ROS:  Please see the history of present illness.   All other systems are personally reviewed and negative.    There were no vitals taken for this visit.  Wt Readings from Last 3 Encounters:  06/01/23 63.6 kg (140 lb 3.4 oz)  05/14/23 64.1 kg (141 lb 6.4 oz)  04/15/23 62.4 kg (137 lb 9.6 oz)   PHYSICAL EXAM: General:  Frail, chronically ill appearing. On 2L  [place]. Arrived in [facility]. HEENT: normal Neck: supple. JVP to midneck. Carotids 2+ bilat; no bruits. No lymphadenopathy or thryomegaly appreciated. Cor: PMI nondisplaced. Regular rate & rhythm. No rubs, gallops or murmurs. Lungs: clear Abdomen: soft, nontender, nondistended. No hepatosplenomegaly. No bruits or masses. Good bowel sounds. Extremities: no cyanosis, clubbing, rash. +3 edema BLE. Red, ruddy scaly b/l. Lesion and weeping on LLE. Neuro: alert & orientedx3, cranial nerves grossly intact.  ASSESSMENT AND PLAN: 1. Chronic diastolic CHF: With prominent RV failure.  Echo in 6/20 showed EF 60-65%, mild RV dilation with normal systolic function, PASP 93 mmHg.  She had remote nonischemic cardiomyopathy with recovery of EF, has Medtronic ICD.  I suspect that there is significant RV dysfunction. Atrial fibrillation likely contributes, but this appears to be permanent now as she has failed multiple anti-arrhythmics and is reasonably rate-controlled. RHC 11/20 showed elevated filling pressures and pulmonary venous hypertension.  Prominent v-waves on PCWP tracing likely due to stiff ventricle/diastolic dysfunction as she had only mild MR on echo.  Echo 10/23 with EF 60-65%, D-shaped septum, mild RV enlargement with normal RV systolic function, PASP 90, moderate-severe TR, mild AS, IVC dilated. Echo (9/24): LVEF 65-70%, D-shaped septum, mildly reduced RV, RVSP 103 mmHg, biatrial enlargement, severe TR, mild AS with mean gradient of 17 mmhg, moderate mitral stenosis with mean gradient of 6 mmHg, dilated IVC. She is RV pacing at a high  percentage, but LV EF has remained normal.  - NYHA IIIb. Cardiomems elevated to 24 (goal 17). Volume up on exam. - Increase metolazone 2.5 mg to 2x/week + Kcl on Metolazone days - Continue torsemide 100 mg bid + 40/20 KCL - Off SGLT2i after UTIs - BMET reviewed 11/20: K 4.4, SCr 1.78. BMET today and repeat at [facility] center 2. Hypertension: BP controlled.  3. Pulmonary hypertension: PA systolic pressure estimated 103 mmHg on last echo in setting of volume overload. Based on RHC 11/20, she has primarily pulmonary venous hypertension, not candidate for pulmonary vasodilators.  4. CKD: Stage 4. Off SGLT2i - Labs 11/20 showed Scr 1.78 - Will repeat BMET/BNP today. 5. Chronic anemia/GI bleeding: Due to small bowel AVMs.  - 2 recent admissions for GI bleeding - She is being followed by Heme/Onc for her anemia. Recieving Fe infusions 6. Atrial fibrillation: Permanent. Rate control is reasonable. She has failed amiodarone, Tikosyn, and flecainide as well as multiple cardioversions.  - Off Eliquis with recurrent bleeding 7. Aortic stenosis: Mild on last echo.  8. Neuroendocrine tumor of pancreatic head: Treating palliative with sandostatin.  9. Mitral stenosis: Moderate on 9/24 echo. Not candidate for repair.  10. Tricuspid regurgitation: Severe on 9/24 echo, functional due to RV failure/pulmonary hypertension.  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 Volume becoming more difficult to manage. Recurrent admissions. Dr. Graciela Husbands previously discussed EOL issues with her and her ICD high velocity therapies are de-activated.  - She has DNR.   Follow up in 3-4 weeks with APP due to high-risk for re-admit and close volume management.   Jacklynn Ganong, NP 06/06/23

## 2023-06-07 DIAGNOSIS — I051 Rheumatic mitral insufficiency: Secondary | ICD-10-CM | POA: Diagnosis not present

## 2023-06-07 DIAGNOSIS — I872 Venous insufficiency (chronic) (peripheral): Secondary | ICD-10-CM | POA: Diagnosis not present

## 2023-06-07 DIAGNOSIS — I421 Obstructive hypertrophic cardiomyopathy: Secondary | ICD-10-CM | POA: Diagnosis not present

## 2023-06-07 DIAGNOSIS — I130 Hypertensive heart and chronic kidney disease with heart failure and stage 1 through stage 4 chronic kidney disease, or unspecified chronic kidney disease: Secondary | ICD-10-CM | POA: Diagnosis not present

## 2023-06-07 DIAGNOSIS — N1832 Chronic kidney disease, stage 3b: Secondary | ICD-10-CM | POA: Diagnosis not present

## 2023-06-07 DIAGNOSIS — M810 Age-related osteoporosis without current pathological fracture: Secondary | ICD-10-CM | POA: Diagnosis not present

## 2023-06-07 DIAGNOSIS — L03116 Cellulitis of left lower limb: Secondary | ICD-10-CM | POA: Diagnosis not present

## 2023-06-07 DIAGNOSIS — I50811 Acute right heart failure: Secondary | ICD-10-CM | POA: Diagnosis not present

## 2023-06-07 DIAGNOSIS — J449 Chronic obstructive pulmonary disease, unspecified: Secondary | ICD-10-CM | POA: Diagnosis not present

## 2023-06-07 DIAGNOSIS — E538 Deficiency of other specified B group vitamins: Secondary | ICD-10-CM | POA: Diagnosis not present

## 2023-06-07 DIAGNOSIS — I272 Pulmonary hypertension, unspecified: Secondary | ICD-10-CM | POA: Diagnosis not present

## 2023-06-07 DIAGNOSIS — D631 Anemia in chronic kidney disease: Secondary | ICD-10-CM | POA: Diagnosis not present

## 2023-06-07 DIAGNOSIS — M10372 Gout due to renal impairment, left ankle and foot: Secondary | ICD-10-CM | POA: Diagnosis not present

## 2023-06-07 DIAGNOSIS — E871 Hypo-osmolality and hyponatremia: Secondary | ICD-10-CM | POA: Diagnosis not present

## 2023-06-07 DIAGNOSIS — K8010 Calculus of gallbladder with chronic cholecystitis without obstruction: Secondary | ICD-10-CM | POA: Diagnosis not present

## 2023-06-07 DIAGNOSIS — D3A8 Other benign neuroendocrine tumors: Secondary | ICD-10-CM | POA: Diagnosis not present

## 2023-06-07 DIAGNOSIS — I771 Stricture of artery: Secondary | ICD-10-CM | POA: Diagnosis not present

## 2023-06-07 DIAGNOSIS — J309 Allergic rhinitis, unspecified: Secondary | ICD-10-CM | POA: Diagnosis not present

## 2023-06-07 DIAGNOSIS — M1990 Unspecified osteoarthritis, unspecified site: Secondary | ICD-10-CM | POA: Diagnosis not present

## 2023-06-07 DIAGNOSIS — J9621 Acute and chronic respiratory failure with hypoxia: Secondary | ICD-10-CM | POA: Diagnosis not present

## 2023-06-07 DIAGNOSIS — L03115 Cellulitis of right lower limb: Secondary | ICD-10-CM | POA: Diagnosis not present

## 2023-06-07 DIAGNOSIS — I4821 Permanent atrial fibrillation: Secondary | ICD-10-CM | POA: Diagnosis not present

## 2023-06-07 DIAGNOSIS — I739 Peripheral vascular disease, unspecified: Secondary | ICD-10-CM | POA: Diagnosis not present

## 2023-06-07 DIAGNOSIS — I5033 Acute on chronic diastolic (congestive) heart failure: Secondary | ICD-10-CM | POA: Diagnosis not present

## 2023-06-09 ENCOUNTER — Encounter (HOSPITAL_COMMUNITY): Payer: PPO

## 2023-06-09 ENCOUNTER — Ambulatory Visit: Payer: PPO

## 2023-06-09 ENCOUNTER — Ambulatory Visit: Payer: PPO | Attending: Family | Admitting: Family

## 2023-06-09 ENCOUNTER — Encounter: Payer: Self-pay | Admitting: Family

## 2023-06-09 ENCOUNTER — Encounter (HOSPITAL_COMMUNITY): Payer: Self-pay

## 2023-06-09 ENCOUNTER — Encounter: Payer: PPO | Admitting: Family

## 2023-06-09 VITALS — BP 129/66 | HR 61 | Wt 141.0 lb

## 2023-06-09 DIAGNOSIS — Z85528 Personal history of other malignant neoplasm of kidney: Secondary | ICD-10-CM | POA: Insufficient documentation

## 2023-06-09 DIAGNOSIS — Z8719 Personal history of other diseases of the digestive system: Secondary | ICD-10-CM | POA: Diagnosis not present

## 2023-06-09 DIAGNOSIS — Z95810 Presence of automatic (implantable) cardiac defibrillator: Secondary | ICD-10-CM | POA: Diagnosis not present

## 2023-06-09 DIAGNOSIS — D500 Iron deficiency anemia secondary to blood loss (chronic): Secondary | ICD-10-CM | POA: Diagnosis not present

## 2023-06-09 DIAGNOSIS — I428 Other cardiomyopathies: Secondary | ICD-10-CM | POA: Insufficient documentation

## 2023-06-09 DIAGNOSIS — N184 Chronic kidney disease, stage 4 (severe): Secondary | ICD-10-CM | POA: Diagnosis not present

## 2023-06-09 DIAGNOSIS — J449 Chronic obstructive pulmonary disease, unspecified: Secondary | ICD-10-CM | POA: Diagnosis not present

## 2023-06-09 DIAGNOSIS — I5032 Chronic diastolic (congestive) heart failure: Secondary | ICD-10-CM | POA: Diagnosis not present

## 2023-06-09 DIAGNOSIS — I130 Hypertensive heart and chronic kidney disease with heart failure and stage 1 through stage 4 chronic kidney disease, or unspecified chronic kidney disease: Secondary | ICD-10-CM | POA: Diagnosis not present

## 2023-06-09 DIAGNOSIS — I272 Pulmonary hypertension, unspecified: Secondary | ICD-10-CM | POA: Insufficient documentation

## 2023-06-09 DIAGNOSIS — R600 Localized edema: Secondary | ICD-10-CM | POA: Diagnosis present

## 2023-06-09 DIAGNOSIS — K31811 Angiodysplasia of stomach and duodenum with bleeding: Secondary | ICD-10-CM | POA: Insufficient documentation

## 2023-06-09 DIAGNOSIS — D3A8 Other benign neuroendocrine tumors: Secondary | ICD-10-CM | POA: Diagnosis not present

## 2023-06-09 DIAGNOSIS — L03116 Cellulitis of left lower limb: Secondary | ICD-10-CM | POA: Insufficient documentation

## 2023-06-09 DIAGNOSIS — C250 Malignant neoplasm of head of pancreas: Secondary | ICD-10-CM | POA: Diagnosis not present

## 2023-06-09 DIAGNOSIS — I10 Essential (primary) hypertension: Secondary | ICD-10-CM | POA: Diagnosis not present

## 2023-06-09 DIAGNOSIS — D631 Anemia in chronic kidney disease: Secondary | ICD-10-CM | POA: Diagnosis not present

## 2023-06-09 DIAGNOSIS — I4821 Permanent atrial fibrillation: Secondary | ICD-10-CM

## 2023-06-09 MED ORDER — CEPHALEXIN 500 MG PO CAPS: 500.0000 mg | ORAL_CAPSULE | Freq: Two times a day (BID) | ORAL | 0 refills | Status: DC

## 2023-06-09 NOTE — Patient Instructions (Addendum)
Take a metolazone with extra potassium tomorrow and then take your regular weekly dose on Saturday.   Go DOWN to LOWER LEVEL (LL) to have your blood work completed inside of [employer] office.  We will only call you if the results are abnormal or if the provider would like to make medication changes.

## 2023-06-09 NOTE — Progress Notes (Signed)
ADVANCED HF CLINIC NOTE    ID:  [name], DOB 01-14-1934, MRN [id number]   PCP:  [name], MD (last seen 10/24) Cardiologist:  Sherryl Manges, MD (last seen 11/24) Primary HF: Dr. Shirlee Latch (last seen 12/24)  Chief Complaint: pedal edema/ weight gain   History of Present Illness:  89 y.o. with history of permanent atrial fibrillation, chronic diastolic CHF, CKD stage 3, smoking/prior COPD, renal cell CA s/p right nephrectomy. She has a prior history of HFrEF with nonischemic cardiomyopathy and had a Medtronic ICD placed. However, subsequently her EF has recovered.    06/20, she had left hip ORIF after mechanical fall.  Echo in 06/20 showed EF 60-65%, mild RV dilation with normal systolic function, PASP 93 mmHg.  After she got home from the hospital stay post-ORIF, she noted marked peripheral edema. She was short of breath walking short distances.  +orthopnea. She came back to the hospital 07/20 and was admitted with volume overload. She was diuresed aggressively and lost about 22 lbs. RHC showed pulmonary venous hypertension. PCWP tracing had prominent v-waves in absence of significant MR, suggesting significant diastolic dysfunction. Noted to have significant Fe deficiency anemia, but FOBT was negative during 7/20 hospitalization.   Admitted 11/20 with symptomatic anemia, GI workup concerning for ischemic bowel. 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 Durwin Nora with VVS and conservative management recommended. Capsule endoscopy showed no definite source of bleeding. She was volume overloaded due to significant RV dysfunction and diuresed, she also developed AKI on CKD stage 3.      10/22, she had bilateral CIA stents placed. ABIs in 11/22 showed patent stents. She had a skin cancer removed from her left leg. 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   Echo 2/23 EF 65-70% with mild LVH, D-shaped septum with moderate RV  enlargement, and mildly decreased RV function, PASP 92, moderate-severe TR, dilated IVC, moderate aortic stenosis. Echo in 10/23 showed EF 60-65%, D-shaped septum, mild RV enlargement with normal RV systolic function, PASP 90, moderate-severe TR, mild AS, IVC dilated.   Patient was found to have a neuroendocrine tumor of the pancreatic head in the fall of 2023.  She has been treated with sandostatin monthly.  She also had further GI bleeding from small bowel AVMs in fall 2023. She also had COVID-19 in 10/23 and has been on home oxygen 2L since that time.   Follow up with Dr. Graciela Husbands, long discussion about EOL. Given DNR form and high-voltage therapies inactivated on ICD.  Admit 07/24 with acute on chronic blood loss anemia 2/2 GI bleed. Transfused and given IV iron. Had small bowel enteroscopy which showed 2 nonbleeding angiodysplastic lesions in duodenum and 3 nonbleeding angioplastic lesions in jejunum treated with APC.  Admitted early 09/24 with acute on chronic anemia suspected 2/2 chronic GI blood loss. Hgb down to 6.8. Received 2 u RBCs. GI did not recommend any endoscopic procedures d/t advanced age and comorbidities. Course further c/b acute on chronic CHF. She was diuresed with IV lasix and metolazone. Echo during admit with LVEF 65-70%, D-shaped septum, mildly reduced RV, RVSP 103 mmHg, biatrial enlargement, severe TR, moderate mitral stenosis with mean gradient of 6 mmHg, dilated IVC.  She was seen back in the [facility] on 01/30/23 for post hospital f/u and noted recurrent melena, SOB and fatigued w/ minimal exertion. Also noted to have recurrent volume overload w/ marked return of LEE. Hgb was down to 6.6. She  was sent back to the ED and readmitted again for a/c CHF and GIB, treated w/ blood transfusions, IV Fe and diuretics. Discharged on 02/06/23 on torsemide 100 mg bid.   S/p 2u RBCs 04/26/23 for symptomatic anemia. Hgb 6.8.   Admitted 05/27/23 due to shortness of breath/ leg swelling. Initially  given IV lasix  with removal of ~ 4Land transitioned to oral torsemide at 100mg  BID at discharge. Metolazone decreased to weekly. Treated with antibiotics for leg cellulitis. Given 1 unit PRBC's due to hemoglobin trending down to 7.1. Wound care consulted due to weeping area on left shin. Palliative care also consulted.   Today she returns for HF follow up visit, with her niece,with a chief complaint of worsening pedal edema/ weight gain since her hospital discharge 1 week ago. She says that she's gained ~ 10 pounds in the last week and her lower leg swelling has increased. Has associated shortness of breath, fatigue, chronic chest pain, palpitations, lightheadedness, easy bruising, abdominal distention and weeping from wound on left lower leg. Denies having much of a cough. She feels like the wound was improving while on antibiotics but since she finished them, it seems to be worse. She has been to the wound center in the past but was unable to tolerate the wrapping of her legs and is hesitant to return. Is wearing oxygen at 2L around the clock. Says that she hasn't been transmitting her cardiomems because she is unable to lie flat due to her SOB. Taking metolazone on Saturdays.    Cardiomems today 06/09/23: 17, goal 17 (personally reviewed)  Labs (10/23): magnesium 1.7 Labs (3/24): TSH 2.93 Labs (4/24): K 3.9, creatinine 1.69, ALT 9, hgb 10, Plt 175 Labs (8/24): K 4.5, creatinine, 1.6 Labs (10/24): K 4.1, creatinine 1.55, Hgb 7.6 Labs (11/24): K 4.6, SCr 1.52 Labs (11/24): K 4.6, SCr 1.82 => 1.78, TSat 3, ferritin 16 Labs (12/24): K 3.9, creatinine 1.83 Labs (01/25): K 4.2, creatinine 1.89  PMH: 1. COPD 2. Renal cell carcinoma: S/p right kidney resection.  3. CKD stage 3 4. Fe deficiency anemia 5. Atrial fibrillation: Permanent. She has failed amiodarone, Tikosyn, and flecainide as well as multiple cardioversions. 6. Chronic diastolic CHF: She has history of prior HFrEF with nonischemic  cardiomyopathy.  She has a Medtronic ICD.   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  - Echo (6/20): EF 60-65%, mildly dilated RV with PASP 93 mmHg, mild-moderate TR.  - RHC (7/20): mean RA 11, PA 64/18 mean 39, mean PCWP 24 with v waves to 41, CI 4.21, PVR 1.9 WU.  - Cardiomems placement - RHC (11/20): mean RA 13, PA 63/23 mean 25 with v waves to 47 (MR only mild by echo), mean PCWP 25, CI 3.18, PVR 2.6 WU - Echo (11/21): EF 65-70%, moderate LVH, D-shaped septum with mildly decreased RV systolic function, RVSP 85.9 mmHg, severe RAE, severe TR, mod-sev AS. - Echo (2/23): EF 65-70% with mild LVH, D-shaped septum with moderate RV enlargement, and mildly decreased RV function, PASP 92, moderate-severe TR, dilated IVC, moderate aortic stenosis.  - Echo (10/23): EF 60-65%, D-shaped septum, mild RV enlargement with normal RV systolic function, PASP 90, moderate-severe TR, mild AS, IVC dilated.  - Echo (9/24): LVEF 65-70%, D-shaped septum, mildly reduced RV, RVSP 103 mmHg, biatrial enlargement, severe TR, moderate mitral stenosis with mean gradient of 6 mmHg, dilated IVC - Echo (01/25): EF 65-70%, mild LVH, RV moderately reduced, severely elevated PA pressure 83.9 mmHg, mild MR, moderate MS with mean  gradient of 6.0 mmHg, IVC dialted 7. Hyperlipidemia 8. Carotid stenosis: Carotid dopplers (3/19) with 40-59% LICA stenosis.   9. Possible ischemic bowel in 11/20 10. 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 Conservative management per Dr. Durwin Nora.  - 10/22 bilateral CIA stents.  - 11/22 ABIs: 1.07 left, 0.83 right with patent CIA stents.  11. 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  12. Squamous cell skin cancer.  13. 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  14. COVID 19 in 10/23    Current Outpatient Medications on File Prior to Visit  Medication Sig Dispense Refill   acetaminophen (TYLENOL) 500 MG tablet Take  1,000 mg by mouth every 8 (eight) hours.     Ascorbic Acid (VITAMIN C PO) Take 1 tablet by mouth daily.     Cholecalciferol (VITAMIN D-3 PO) Take 2 capsules by mouth daily.     CRANBERRY PO Take 1 tablet by mouth daily.     Cyanocobalamin (B-12 PO) Take 1 tablet by mouth daily.     ferrous gluconate (FERGON) 324 MG tablet Take 1 tablet (324 mg total) by mouth daily with breakfast. 30 tablet 3   fluticasone (FLONASE) 50 MCG/ACT nasal spray Place 2 sprays into both nostrils daily as needed for allergies or rhinitis. 16 g 0   metolazone (ZAROXOLYN) 2.5 MG tablet Take 1 tablet (2.5 mg total) by mouth once a week. Every Tuesday with 40 meq of potassium 90 tablet 3   Multiple Vitamins-Minerals (HAIR SKIN NAILS PO) Take 1 capsule by mouth daily.     pantoprazole (PROTONIX) 40 MG tablet Take 1 tablet (40 mg total) by mouth daily. (Patient taking differently: Take 40 mg by mouth daily before breakfast.) 30 tablet 3   potassium chloride SA (KLOR-CON M) 20 MEQ tablet Take 2 tablets (40 mEq total) by mouth every morning AND 1 tablet (20 mEq total) at bedtime. Take extra 2 tabs when you take metolazone. (Patient taking differently: Take 20 mEq by mouth in the morning and evening and an additional 20 mEq once daily on Tues/Sat only) 110 tablet 6   potassium chloride SA (KLOR-CON M) 20 MEQ tablet Take 1 tablet (20 mEq total) by mouth once a week. Weekly with Metolazone dose every Saturday (Patient not taking: Reported on 05/27/2023) 15 tablet 6   rosuvastatin (CRESTOR) 40 MG tablet Take 1 tablet (40 mg total) by mouth daily. (Patient not taking: Reported on 05/27/2023) 30 tablet 11   torsemide (DEMADEX) 20 MG tablet Take 5 tablets (100 mg total) by mouth 2 (two) times daily.     traMADol (ULTRAM) 50 MG tablet Take 50 mg by mouth every 6 (six) hours as needed (for pain).     No current facility-administered medications on file prior to visit.     Allergies:   Dilaudid [hydromorphone], Fentanyl, Codeine, Morphine and  related, Amoxicillin, Hydrocodone, Oxycodone, Tramadol, Whole blood, and Sulfonamide derivatives    Social History:  The patient  reports that she quit smoking about 21 years ago. Her smoking use included cigarettes. She has a 20.00 pack-year smoking history. She has never used smokeless tobacco. She reports that she does not drink alcohol and does not use drugs.    Family History:  The patient's family history includes Breast cancer in her cousin and another family member; Breast cancer (age of onset: 68) in her maternal aunt; Early death in her father; Heart failure in her mother.    ROS:  Please see the history of present illness.   All other systems are personally reviewed and negative.    Vitals:   06/09/23 1334  BP: 129/66  Pulse: 61  SpO2: 98%  Weight: 141 lb (64 kg)   Wt Readings from Last 3 Encounters:  06/09/23 141 lb (64 kg)  06/01/23 140 lb 3.4 oz (63.6 kg)  05/14/23 141 lb 6.4 oz (64.1 kg)   Lab Results  Component Value Date   CREATININE 1.89 (H) 06/01/2023   CREATININE 1.81 (H) 05/31/2023   CREATININE 1.60 (H) 05/30/2023   PHYSICAL EXAM: General: Frail, chronically ill appearing. On 2L [place], arrived in wheelchair HEENT: normal Neck: supple, JVP slightly elevated Cor: Regular rhythm, rate. No rubs, gallops or murmurs Lungs: clear Abdomin: soft, nontender, distended. Extremities: no cyanosis, 2+ pitting edema bilateral lower legs; ruddy scaly bilateral shins, weeping wound left anterior shin, legs tender to palpation Neuro: alert & oriented X 3. Moves all 4 extremities w/o difficulty. Affect pleasant   ASSESSMENT AND PLAN:  1. Chronic diastolic CHF: With prominent RV failure. Echo in 6/20 showed EF 60-65%, mild RV dilation with normal systolic function, PASP 93 mmHg.  She had remote nonischemic cardiomyopathy with recovery of EF, has Medtronic ICD. Atrial fibrillation likely contributes, but this appears to be permanent now as she has failed multiple anti-arrhythmics  and is reasonably rate-controlled. RHC 11/20 showed elevated filling pressures and pulmonary venous hypertension. Prominent v-waves on PCWP tracing likely due to stiff ventricle/diastolic dysfunction as she had only mild MR on echo. Echo 10/23 with EF 60-65%, D-shaped septum, mild RV enlargement with normal RV systolic function, PASP 90, moderate-severe TR, mild AS, IVC dilated. Echo (9/24): LVEF 65-70%, D-shaped septum, mildly reduced RV, RVSP 103 mmHg, biatrial enlargement, severe TR, mild AS with mean gradient of 17 mmhg, moderate mitral stenosis with mean gradient of 6 mmHg, dilated IVC. She is RV pacing at a high percentage, but LV EF has remained normal.  - NYHA IIIb. Cardiomems today is 17 (goal 17). Appears to be volume up on exam but in abdomen and bilateral lower legs - discussed with Dr. Shirlee Latch - take extra 2.5mg  metolazone + Kcl tomorrow - Continue torsemide 100 mg bid + 40/20 KCL - continue metolazone 2.5mg  w/ potassium weekly on Saturdays - Off SGLT2i after UTIs - BMET / BNP today - BNP reviewed 05/27/23 was 416.4  2. Hypertension:  - BP 129/66 - BMET 06/01/23 reviewed and showed sodium 136, potassium 4.2, creatinine 1.89 & GFR 25 - BMET today  3. Pulmonary hypertension: PA systolic pressure estimated 83.9 mmHg on last echo in setting of volume overload.  - Based on RHC 11/20, she has primarily pulmonary venous hypertension, not candidate for pulmonary vasodilators.   4. CKD: Stage 4. Off SGLT2i - Labs 06/01/23 showed Scr 1.89 - Will repeat BMET/BNP today.  5. Chronic anemia/GI bleeding: Due to small bowel AVMs.  - previous admissions for GI bleeding - being followed by Heme/Onc for her anemia. Has received Fe infusions - CBC today  6. Atrial fibrillation: Permanent. Rate control is reasonable. She has failed amiodarone, Tikosyn, and flecainide as well as multiple cardioversions.  - Off Eliquis with recurrent bleeding - follows with EP Graciela Husbands)  7. Neuroendocrine  tumor of pancreatic head:  - followed by oncology - Treating palliative with sandostatin.   8 Cellulitis - will treat with additional 7 days of keflex 500mg  BID - encouraged to elevate legs as much as possible - discussed wound clinic referral but  she defers today  Return later this week for recheck of symptoms, sooner if needed   Delma Freeze, NP 06/09/23

## 2023-06-10 ENCOUNTER — Encounter: Payer: PPO | Admitting: Family

## 2023-06-10 ENCOUNTER — Telehealth: Payer: Self-pay

## 2023-06-10 ENCOUNTER — Encounter: Payer: Self-pay | Admitting: Hematology

## 2023-06-10 ENCOUNTER — Ambulatory Visit: Payer: Self-pay

## 2023-06-10 LAB — BASIC METABOLIC PANEL
BUN/Creatinine Ratio: 28 (ref 12–28)
BUN: 48 mg/dL — ABNORMAL HIGH (ref 8–27)
CO2: 28 mmol/L (ref 20–29)
Calcium: 9 mg/dL (ref 8.7–10.3)
Chloride: 96 mmol/L (ref 96–106)
Creatinine, Ser: 1.69 mg/dL — ABNORMAL HIGH (ref 0.57–1.00)
Glucose: 104 mg/dL — ABNORMAL HIGH (ref 70–99)
Potassium: 4.6 mmol/L (ref 3.5–5.2)
Sodium: 137 mmol/L (ref 134–144)
eGFR: 29 mL/min/{1.73_m2} — ABNORMAL LOW (ref >59)

## 2023-06-10 LAB — CUP PACEART REMOTE DEVICE CHECK
Battery Remaining Longevity: 15 mo
Battery Voltage: 2.89 V
Brady Statistic AP VP Percent: 0 %
Brady Statistic AP VS Percent: 0 %
Brady Statistic AS VP Percent: 0 %
Brady Statistic AS VS Percent: 0 %
Brady Statistic RA Percent Paced: 0 %
Brady Statistic RV Percent Paced: 70.39 %
Date Time Interrogation Session: 20250203073723
HighPow Impedance: 38 Ohm
HighPow Impedance: 53 Ohm
Implantable Lead Connection Status: 753985
Implantable Lead Connection Status: 753985
Implantable Lead Implant Date: 20061026
Implantable Lead Implant Date: 20111230
Implantable Lead Location: 753859
Implantable Lead Location: 753860
Implantable Lead Model: 5076
Implantable Lead Model: 6947
Implantable Pulse Generator Implant Date: 20171018
Lead Channel Impedance Value: 342 Ohm
Lead Channel Impedance Value: 418 Ohm
Lead Channel Impedance Value: 456 Ohm
Lead Channel Pacing Threshold Amplitude: 0.5 V
Lead Channel Pacing Threshold Pulse Width: 0.4 ms
Lead Channel Sensing Intrinsic Amplitude: 0.25 mV
Lead Channel Sensing Intrinsic Amplitude: 0.375 mV
Lead Channel Sensing Intrinsic Amplitude: 8.625 mV
Lead Channel Sensing Intrinsic Amplitude: 8.625 mV
Lead Channel Setting Pacing Amplitude: 2.5 V
Lead Channel Setting Pacing Pulse Width: 0.4 ms
Lead Channel Setting Sensing Sensitivity: 0.3 mV
Zone Setting Status: 755011

## 2023-06-10 LAB — CBC
Hematocrit: 28.9 % — ABNORMAL LOW (ref 34.0–46.6)
Hemoglobin: 8.1 g/dL — ABNORMAL LOW (ref 11.1–15.9)
MCH: 23.6 pg — ABNORMAL LOW (ref 26.6–33.0)
MCHC: 28 g/dL — ABNORMAL LOW (ref 31.5–35.7)
MCV: 84 fL (ref 79–97)
Platelets: 256 10*3/uL (ref 150–450)
RBC: 3.43 x10E6/uL — ABNORMAL LOW (ref 3.77–5.28)
RDW: 19.5 % — ABNORMAL HIGH (ref 11.7–15.4)
WBC: 5.6 10*3/uL (ref 3.4–10.8)

## 2023-06-10 LAB — BRAIN NATRIURETIC PEPTIDE: BNP: 271.3 pg/mL — ABNORMAL HIGH (ref 0.0–100.0)

## 2023-06-10 NOTE — Telephone Encounter (Signed)
 Copied from CRM [id number]. Topic: Clinical - Home Health Verbal Orders >> Jun 10, 2023  8:06 AM Isabell A wrote: Caller/Agency: Corean from [facility] Callback Number: [phone] Service Requested: Occupational Therapy Frequency: N/A Any new concerns about the patient? Yes. Refusing all OT services, Corean will not be seeing her.

## 2023-06-11 ENCOUNTER — Encounter: Payer: Self-pay | Admitting: Family

## 2023-06-12 ENCOUNTER — Telehealth: Payer: Self-pay | Admitting: Family

## 2023-06-12 NOTE — Telephone Encounter (Signed)
 Pt confirmed appt for 06/13/23

## 2023-06-13 ENCOUNTER — Other Ambulatory Visit: Payer: Self-pay

## 2023-06-13 ENCOUNTER — Telehealth (HOSPITAL_COMMUNITY): Payer: Self-pay | Admitting: Hematology

## 2023-06-13 ENCOUNTER — Telehealth: Payer: Self-pay

## 2023-06-13 ENCOUNTER — Ambulatory Visit (HOSPITAL_BASED_OUTPATIENT_CLINIC_OR_DEPARTMENT_OTHER): Payer: PPO | Admitting: Family

## 2023-06-13 ENCOUNTER — Encounter: Payer: Self-pay | Admitting: Family

## 2023-06-13 ENCOUNTER — Inpatient Hospital Stay
Admission: EM | Admit: 2023-06-13 | Discharge: 2023-06-20 | DRG: 602 | Disposition: A | Discharge status: Home Health | Payer: PPO | Attending: Internal Medicine | Admitting: Internal Medicine

## 2023-06-13 ENCOUNTER — Emergency Department: Payer: PPO

## 2023-06-13 VITALS — BP 136/83 | HR 72 | Wt 141.4 lb

## 2023-06-13 DIAGNOSIS — G8929 Other chronic pain: Secondary | ICD-10-CM | POA: Diagnosis present

## 2023-06-13 DIAGNOSIS — K5521 Angiodysplasia of colon with hemorrhage: Secondary | ICD-10-CM | POA: Diagnosis not present

## 2023-06-13 DIAGNOSIS — I83029 Varicose veins of left lower extremity with ulcer of unspecified site: Secondary | ICD-10-CM

## 2023-06-13 DIAGNOSIS — I272 Pulmonary hypertension, unspecified: Secondary | ICD-10-CM | POA: Diagnosis not present

## 2023-06-13 DIAGNOSIS — I10 Essential (primary) hypertension: Secondary | ICD-10-CM

## 2023-06-13 DIAGNOSIS — I082 Rheumatic disorders of both aortic and tricuspid valves: Secondary | ICD-10-CM | POA: Insufficient documentation

## 2023-06-13 DIAGNOSIS — I509 Heart failure, unspecified: Secondary | ICD-10-CM

## 2023-06-13 DIAGNOSIS — I739 Peripheral vascular disease, unspecified: Secondary | ICD-10-CM | POA: Diagnosis present

## 2023-06-13 DIAGNOSIS — R600 Localized edema: Secondary | ICD-10-CM | POA: Diagnosis not present

## 2023-06-13 DIAGNOSIS — I5043 Acute on chronic combined systolic (congestive) and diastolic (congestive) heart failure: Secondary | ICD-10-CM | POA: Diagnosis not present

## 2023-06-13 DIAGNOSIS — R079 Chest pain, unspecified: Secondary | ICD-10-CM | POA: Insufficient documentation

## 2023-06-13 DIAGNOSIS — C254 Malignant neoplasm of endocrine pancreas: Secondary | ICD-10-CM | POA: Diagnosis not present

## 2023-06-13 DIAGNOSIS — I130 Hypertensive heart and chronic kidney disease with heart failure and stage 1 through stage 4 chronic kidney disease, or unspecified chronic kidney disease: Secondary | ICD-10-CM | POA: Diagnosis present

## 2023-06-13 DIAGNOSIS — R140 Abdominal distension (gaseous): Secondary | ICD-10-CM | POA: Insufficient documentation

## 2023-06-13 DIAGNOSIS — R002 Palpitations: Secondary | ICD-10-CM | POA: Insufficient documentation

## 2023-06-13 DIAGNOSIS — M7989 Other specified soft tissue disorders: Secondary | ICD-10-CM | POA: Insufficient documentation

## 2023-06-13 DIAGNOSIS — Z8616 Personal history of COVID-19: Secondary | ICD-10-CM

## 2023-06-13 DIAGNOSIS — M1611 Unilateral primary osteoarthritis, right hip: Secondary | ICD-10-CM | POA: Diagnosis present

## 2023-06-13 DIAGNOSIS — Z95810 Presence of automatic (implantable) cardiac defibrillator: Secondary | ICD-10-CM

## 2023-06-13 DIAGNOSIS — I878 Other specified disorders of veins: Secondary | ICD-10-CM | POA: Diagnosis present

## 2023-06-13 DIAGNOSIS — L97929 Non-pressure chronic ulcer of unspecified part of left lower leg with unspecified severity: Secondary | ICD-10-CM | POA: Diagnosis present

## 2023-06-13 DIAGNOSIS — I428 Other cardiomyopathies: Secondary | ICD-10-CM | POA: Diagnosis present

## 2023-06-13 DIAGNOSIS — R0989 Other specified symptoms and signs involving the circulatory and respiratory systems: Secondary | ICD-10-CM | POA: Diagnosis not present

## 2023-06-13 DIAGNOSIS — Z66 Do not resuscitate: Secondary | ICD-10-CM | POA: Diagnosis present

## 2023-06-13 DIAGNOSIS — Z85528 Personal history of other malignant neoplasm of kidney: Secondary | ICD-10-CM

## 2023-06-13 DIAGNOSIS — I5033 Acute on chronic diastolic (congestive) heart failure: Secondary | ICD-10-CM | POA: Diagnosis present

## 2023-06-13 DIAGNOSIS — Z7901 Long term (current) use of anticoagulants: Secondary | ICD-10-CM

## 2023-06-13 DIAGNOSIS — I872 Venous insufficiency (chronic) (peripheral): Secondary | ICD-10-CM | POA: Diagnosis present

## 2023-06-13 DIAGNOSIS — Z87891 Personal history of nicotine dependence: Secondary | ICD-10-CM

## 2023-06-13 DIAGNOSIS — Z79899 Other long term (current) drug therapy: Secondary | ICD-10-CM | POA: Insufficient documentation

## 2023-06-13 DIAGNOSIS — Z886 Allergy status to analgesic agent status: Secondary | ICD-10-CM

## 2023-06-13 DIAGNOSIS — Z885 Allergy status to narcotic agent status: Secondary | ICD-10-CM

## 2023-06-13 DIAGNOSIS — N189 Chronic kidney disease, unspecified: Secondary | ICD-10-CM | POA: Diagnosis not present

## 2023-06-13 DIAGNOSIS — J9611 Chronic respiratory failure with hypoxia: Secondary | ICD-10-CM | POA: Diagnosis not present

## 2023-06-13 DIAGNOSIS — N184 Chronic kidney disease, stage 4 (severe): Secondary | ICD-10-CM | POA: Diagnosis present

## 2023-06-13 DIAGNOSIS — L0390 Cellulitis, unspecified: Secondary | ICD-10-CM | POA: Insufficient documentation

## 2023-06-13 DIAGNOSIS — D649 Anemia, unspecified: Secondary | ICD-10-CM | POA: Insufficient documentation

## 2023-06-13 DIAGNOSIS — I083 Combined rheumatic disorders of mitral, aortic and tricuspid valves: Secondary | ICD-10-CM | POA: Diagnosis present

## 2023-06-13 DIAGNOSIS — D500 Iron deficiency anemia secondary to blood loss (chronic): Secondary | ICD-10-CM | POA: Diagnosis not present

## 2023-06-13 DIAGNOSIS — C7A8 Other malignant neuroendocrine tumors: Secondary | ICD-10-CM | POA: Diagnosis not present

## 2023-06-13 DIAGNOSIS — J449 Chronic obstructive pulmonary disease, unspecified: Secondary | ICD-10-CM | POA: Insufficient documentation

## 2023-06-13 DIAGNOSIS — C44709 Unspecified malignant neoplasm of skin of left lower limb, including hip: Secondary | ICD-10-CM | POA: Diagnosis not present

## 2023-06-13 DIAGNOSIS — J90 Pleural effusion, not elsewhere classified: Secondary | ICD-10-CM | POA: Diagnosis not present

## 2023-06-13 DIAGNOSIS — Z905 Acquired absence of kidney: Secondary | ICD-10-CM | POA: Diagnosis not present

## 2023-06-13 DIAGNOSIS — L03116 Cellulitis of left lower limb: Secondary | ICD-10-CM | POA: Diagnosis not present

## 2023-06-13 DIAGNOSIS — E7800 Pure hypercholesterolemia, unspecified: Secondary | ICD-10-CM | POA: Diagnosis present

## 2023-06-13 DIAGNOSIS — C44729 Squamous cell carcinoma of skin of left lower limb, including hip: Secondary | ICD-10-CM | POA: Diagnosis present

## 2023-06-13 DIAGNOSIS — B079 Viral wart, unspecified: Secondary | ICD-10-CM | POA: Diagnosis present

## 2023-06-13 DIAGNOSIS — Z888 Allergy status to other drugs, medicaments and biological substances status: Secondary | ICD-10-CM

## 2023-06-13 DIAGNOSIS — I110 Hypertensive heart disease with heart failure: Secondary | ICD-10-CM | POA: Diagnosis not present

## 2023-06-13 DIAGNOSIS — Z803 Family history of malignant neoplasm of breast: Secondary | ICD-10-CM

## 2023-06-13 DIAGNOSIS — E785 Hyperlipidemia, unspecified: Secondary | ICD-10-CM | POA: Diagnosis not present

## 2023-06-13 DIAGNOSIS — I5042 Chronic combined systolic (congestive) and diastolic (congestive) heart failure: Secondary | ICD-10-CM | POA: Insufficient documentation

## 2023-06-13 DIAGNOSIS — Z8249 Family history of ischemic heart disease and other diseases of the circulatory system: Secondary | ICD-10-CM

## 2023-06-13 DIAGNOSIS — Z90710 Acquired absence of both cervix and uterus: Secondary | ICD-10-CM

## 2023-06-13 DIAGNOSIS — Z8719 Personal history of other diseases of the digestive system: Secondary | ICD-10-CM

## 2023-06-13 DIAGNOSIS — Z8507 Personal history of malignant neoplasm of pancreas: Secondary | ICD-10-CM | POA: Diagnosis not present

## 2023-06-13 DIAGNOSIS — D3A8 Other benign neuroendocrine tumors: Secondary | ICD-10-CM | POA: Diagnosis not present

## 2023-06-13 DIAGNOSIS — I2510 Atherosclerotic heart disease of native coronary artery without angina pectoris: Secondary | ICD-10-CM | POA: Diagnosis not present

## 2023-06-13 DIAGNOSIS — R0602 Shortness of breath: Secondary | ICD-10-CM | POA: Insufficient documentation

## 2023-06-13 DIAGNOSIS — Z515 Encounter for palliative care: Secondary | ICD-10-CM

## 2023-06-13 DIAGNOSIS — K219 Gastro-esophageal reflux disease without esophagitis: Secondary | ICD-10-CM | POA: Diagnosis not present

## 2023-06-13 DIAGNOSIS — Z91048 Other nonmedicinal substance allergy status: Secondary | ICD-10-CM

## 2023-06-13 DIAGNOSIS — I4821 Permanent atrial fibrillation: Secondary | ICD-10-CM | POA: Diagnosis present

## 2023-06-13 DIAGNOSIS — Z96643 Presence of artificial hip joint, bilateral: Secondary | ICD-10-CM | POA: Diagnosis present

## 2023-06-13 DIAGNOSIS — C4492 Squamous cell carcinoma of skin, unspecified: Secondary | ICD-10-CM | POA: Diagnosis not present

## 2023-06-13 DIAGNOSIS — I83892 Varicose veins of left lower extremities with other complications: Secondary | ICD-10-CM | POA: Diagnosis not present

## 2023-06-13 DIAGNOSIS — Z9981 Dependence on supplemental oxygen: Secondary | ICD-10-CM

## 2023-06-13 DIAGNOSIS — R0789 Other chest pain: Secondary | ICD-10-CM | POA: Diagnosis not present

## 2023-06-13 DIAGNOSIS — Z880 Allergy status to penicillin: Secondary | ICD-10-CM

## 2023-06-13 DIAGNOSIS — Z604 Social exclusion and rejection: Secondary | ICD-10-CM | POA: Diagnosis present

## 2023-06-13 DIAGNOSIS — Z95820 Peripheral vascular angioplasty status with implants and grafts: Secondary | ICD-10-CM

## 2023-06-13 LAB — TROPONIN I (HIGH SENSITIVITY)
Troponin I (High Sensitivity): 43 ng/L — ABNORMAL HIGH (ref <18)
Troponin I (High Sensitivity): 43 ng/L — ABNORMAL HIGH (ref <18)

## 2023-06-13 LAB — BASIC METABOLIC PANEL
Anion gap: 12 (ref 5–15)
BUN: 58 mg/dL — ABNORMAL HIGH (ref 8–23)
CO2: 29 mmol/L (ref 22–32)
Calcium: 8.5 mg/dL — ABNORMAL LOW (ref 8.9–10.3)
Chloride: 93 mmol/L — ABNORMAL LOW (ref 98–111)
Creatinine, Ser: 1.86 mg/dL — ABNORMAL HIGH (ref 0.44–1.00)
GFR, Estimated: 26 mL/min — ABNORMAL LOW (ref >60)
Glucose, Bld: 99 mg/dL (ref 70–99)
Potassium: 4 mmol/L (ref 3.5–5.1)
Sodium: 134 mmol/L — ABNORMAL LOW (ref 135–145)

## 2023-06-13 LAB — CBC
HCT: 27.1 % — ABNORMAL LOW (ref 36.0–46.0)
Hemoglobin: 7.8 g/dL — ABNORMAL LOW (ref 12.0–15.0)
MCH: 24.7 pg — ABNORMAL LOW (ref 26.0–34.0)
MCHC: 28.8 g/dL — ABNORMAL LOW (ref 30.0–36.0)
MCV: 85.8 fL (ref 80.0–100.0)
Platelets: 243 10*3/uL (ref 150–400)
RBC: 3.16 MIL/uL — ABNORMAL LOW (ref 3.87–5.11)
RDW: 21.1 % — ABNORMAL HIGH (ref 11.5–15.5)
WBC: 5.3 10*3/uL (ref 4.0–10.5)
nRBC: 0 % (ref 0.0–0.2)

## 2023-06-13 LAB — BRAIN NATRIURETIC PEPTIDE: B Natriuretic Peptide: 349.6 pg/mL — ABNORMAL HIGH (ref 0.0–100.0)

## 2023-06-13 MED ORDER — ONDANSETRON HCL 4 MG PO TABS
4.0000 mg | ORAL_TABLET | Freq: Four times a day (QID) | ORAL | Status: DC | PRN
  Administered 2023-06-20: 4 mg via ORAL
  Filled 2023-06-13: qty 1

## 2023-06-13 MED ORDER — POTASSIUM CHLORIDE CRYS ER 20 MEQ PO TBCR: 20.0000 meq | EXTENDED_RELEASE_TABLET | Freq: Every day | ORAL | Status: DC

## 2023-06-13 MED ORDER — VITAMIN B-12 1000 MCG PO TABS
500.0000 ug | ORAL_TABLET | Freq: Every day | ORAL | Status: DC
  Administered 2023-06-14 – 2023-06-20 (×7): 500 ug via ORAL
  Filled 2023-06-13 (×7): qty 1

## 2023-06-13 MED ORDER — METOLAZONE 2.5 MG PO TABS: 2.5000 mg | ORAL_TABLET | ORAL | Status: DC

## 2023-06-13 MED ORDER — ROSUVASTATIN CALCIUM 20 MG PO TABS
40.0000 mg | ORAL_TABLET | Freq: Every day | ORAL | Status: DC
  Administered 2023-06-14 – 2023-06-20 (×7): 40 mg via ORAL
  Filled 2023-06-13 (×2): qty 2
  Filled 2023-06-13: qty 4
  Filled 2023-06-13 (×2): qty 2
  Filled 2023-06-13 (×2): qty 4
  Filled 2023-06-13 (×2): qty 2
  Filled 2023-06-13: qty 4
  Filled 2023-06-13: qty 2

## 2023-06-13 MED ORDER — BUMETANIDE 0.25 MG/ML IJ SOLN
1.0000 mg | Freq: Once | INTRAMUSCULAR | Status: AC
  Administered 2023-06-13: 1 mg via INTRAVENOUS
  Filled 2023-06-13: qty 4

## 2023-06-13 MED ORDER — FERROUS GLUCONATE 324 (38 FE) MG PO TABS
324.0000 mg | ORAL_TABLET | Freq: Every day | ORAL | Status: DC
Start: 2023-06-14 — End: 2023-06-20
  Administered 2023-06-15 – 2023-06-20 (×6): 324 mg via ORAL
  Filled 2023-06-13 (×8): qty 1

## 2023-06-13 MED ORDER — CEFAZOLIN SODIUM-DEXTROSE 2-4 GM/100ML-% IV SOLN: 2.0000 g | Freq: Three times a day (TID) | INTRAVENOUS | Status: DC

## 2023-06-13 MED ORDER — ACETAMINOPHEN 650 MG RE SUPP: 650.0000 mg | Freq: Four times a day (QID) | RECTAL | Status: DC | PRN

## 2023-06-13 MED ORDER — CRANBERRY 250 MG PO TABS: ORAL_TABLET | Freq: Every day | ORAL | Status: DC

## 2023-06-13 MED ORDER — VANCOMYCIN HCL 1500 MG/300ML IV SOLN
1500.0000 mg | Freq: Once | INTRAVENOUS | Status: AC
  Administered 2023-06-13: 1500 mg via INTRAVENOUS
  Filled 2023-06-13: qty 300

## 2023-06-13 MED ORDER — ACETAMINOPHEN 325 MG PO TABS
650.0000 mg | ORAL_TABLET | Freq: Four times a day (QID) | ORAL | Status: DC | PRN
  Administered 2023-06-13 – 2023-06-20 (×15): 650 mg via ORAL
  Filled 2023-06-13 (×15): qty 2

## 2023-06-13 MED ORDER — TORSEMIDE 20 MG PO TABS
100.0000 mg | ORAL_TABLET | Freq: Two times a day (BID) | ORAL | Status: DC
  Administered 2023-06-14: 100 mg via ORAL
  Filled 2023-06-13: qty 5

## 2023-06-13 MED ORDER — ENOXAPARIN SODIUM 30 MG/0.3ML IJ SOSY
30.0000 mg | PREFILLED_SYRINGE | INTRAMUSCULAR | Status: DC
  Administered 2023-06-13 – 2023-06-19 (×7): 30 mg via SUBCUTANEOUS
  Filled 2023-06-13 (×7): qty 0.3

## 2023-06-13 MED ORDER — VITAMIN C 500 MG PO TABS
500.0000 mg | ORAL_TABLET | Freq: Every day | ORAL | Status: DC
  Administered 2023-06-14 – 2023-06-20 (×7): 500 mg via ORAL
  Filled 2023-06-13 (×7): qty 1

## 2023-06-13 MED ORDER — ONDANSETRON HCL 4 MG/2ML IJ SOLN
4.0000 mg | Freq: Four times a day (QID) | INTRAMUSCULAR | Status: DC | PRN
  Administered 2023-06-19 (×3): 4 mg via INTRAVENOUS
  Filled 2023-06-13 (×3): qty 2

## 2023-06-13 MED ORDER — TRAZODONE HCL 50 MG PO TABS
25.0000 mg | ORAL_TABLET | Freq: Every evening | ORAL | Status: DC | PRN
  Filled 2023-06-13: qty 1

## 2023-06-13 MED ORDER — PANTOPRAZOLE SODIUM 40 MG PO TBEC
40.0000 mg | DELAYED_RELEASE_TABLET | Freq: Every day | ORAL | Status: DC
Start: 2023-06-14 — End: 2023-06-20
  Administered 2023-06-14 – 2023-06-20 (×7): 40 mg via ORAL
  Filled 2023-06-13 (×7): qty 1

## 2023-06-13 MED ORDER — POTASSIUM CHLORIDE CRYS ER 20 MEQ PO TBCR: 40.0000 meq | EXTENDED_RELEASE_TABLET | Freq: Every morning | ORAL | Status: DC

## 2023-06-13 MED ORDER — MAGNESIUM HYDROXIDE 400 MG/5ML PO SUSP: 30.0000 mL | Freq: Every day | ORAL | Status: DC | PRN

## 2023-06-13 NOTE — Telephone Encounter (Signed)
 Patient called to cancel her PET scan and did not wish to reschedule. Ala Alice, RN will advise Dr Maryalice Smaller

## 2023-06-13 NOTE — ED Provider Triage Note (Signed)
 Emergency Medicine Provider Triage Evaluation Note  [name] , a 89 y.o. female  was evaluated in triage.  Pt complains of increased bilateral leg weeping and pain. Patient reports she is sent to ED via CHF provider for antibiotics for possible infection. Patient has squamous cell carcinoma on left LE.  Review of Systems  Positive: chills Negative: fever  Physical Exam  BP (!) 133/58   Pulse 60   Temp 98 F (36.7 C) (Oral)   Resp 20   SpO2 100%  Gen:   Awake, no distress   Resp:  Normal effort  MSK:   Moves extremities without difficulty  Other:  Bilateral LE peeling & weeping with erythremic base. Tender to touch    Medical Decision Making  Medically screening exam initiated at 4:09 PM.  Appropriate orders placed.  [name] was informed that the remainder of the evaluation will be completed by another provider, this initial triage assessment does not replace that evaluation, and the importance of remaining in the ED until their evaluation is complete.    Margrette, Naod Sweetland A, PA-C 06/13/23 1614

## 2023-06-13 NOTE — Consult Note (Signed)
 ED Pharmacy Antibiotic Sign Off An antibiotic consult was received from an ED provider for Vancomycin  per pharmacy dosing for wound infection. A chart review was completed to assess appropriateness.   The following one time order(s) were placed:  Vancomycin  1500mg  IV x 1 dose  Further antibiotic and/or antibiotic pharmacy consults should be ordered by the admitting provider if indicated.   Thank you for allowing pharmacy to be a part of this patient's care.   Marissa Lowrey Rodriguez-Guzman PharmD, BCPS 06/13/2023 7:00 PM

## 2023-06-13 NOTE — Progress Notes (Signed)
 ADVANCED HF CLINIC NOTE    ID:  [name], DOB 08-26-1933, MRN [id number]    PCP:  [name], MD (last seen 10/24) Cardiologist:  Elspeth Sage, MD (last seen 11/24) Primary HF: Dr. Rolan (last seen 12/24)  Chief Complaint: leg swelling/ pain   History of Present Illness:  89 y.o. with history of permanent atrial fibrillation, chronic diastolic CHF, CKD stage 3, smoking/prior COPD, renal cell CA s/p right nephrectomy. She has a prior history of HFrEF with nonischemic cardiomyopathy and had a Medtronic ICD placed. However, subsequently her EF has recovered.    06/20, she had left hip ORIF after mechanical fall.  Echo in 06/20 showed EF 60-65%, mild RV dilation with normal systolic function, PASP 93 mmHg.  After she got home from the hospital stay post-ORIF, she noted marked peripheral edema. She was short of breath walking short distances.  +orthopnea. She came back to the hospital 07/20 and was admitted with volume overload. She was diuresed aggressively and lost about 22 lbs. RHC showed pulmonary venous hypertension. PCWP tracing had prominent v-waves in absence of significant MR, suggesting significant diastolic dysfunction. Noted to have significant Fe deficiency anemia, but FOBT was negative during 7/20 hospitalization.   Admitted 11/20 with symptomatic anemia, GI workup concerning for ischemic bowel. 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 Melvenia with VVS and conservative management recommended. Capsule endoscopy showed no definite source of bleeding. She was volume overloaded due to significant RV dysfunction and diuresed, she also developed AKI on CKD stage 3.      10/22, she had bilateral CIA stents placed. ABIs in 11/22 showed patent stents. She had a skin cancer removed from her left leg. 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   Echo 2/23 EF 65-70% with mild LVH, D-shaped septum with moderate RV  enlargement, and mildly decreased RV function, PASP 92, moderate-severe TR, dilated IVC, moderate aortic stenosis. Echo in 10/23 showed EF 60-65%, D-shaped septum, mild RV enlargement with normal RV systolic function, PASP 90, moderate-severe TR, mild AS, IVC dilated.   Patient was found to have a neuroendocrine tumor of the pancreatic head in the fall of 2023.  She has been treated with sandostatin  monthly.  She also had further GI bleeding from small bowel AVMs in fall 2023. She also had COVID-19 in 10/23 and has been on home oxygen  2L since that time.   Follow up with Dr. Sage, long discussion about EOL. Given DNR form and high-voltage therapies inactivated on ICD.  Admit 07/24 with acute on chronic blood loss anemia 2/2 GI bleed. Transfused and given IV iron . Had small bowel enteroscopy which showed 2 nonbleeding angiodysplastic lesions in duodenum and 3 nonbleeding angioplastic lesions in jejunum treated with APC.  Admitted early 09/24 with acute on chronic anemia suspected 2/2 chronic GI blood loss. Hgb down to 6.8. Received 2 u RBCs. GI did not recommend any endoscopic procedures d/t advanced age and comorbidities. Course further c/b acute on chronic CHF. She was diuresed with IV lasix  and metolazone . Echo during admit with LVEF 65-70%, D-shaped septum, mildly reduced RV, RVSP 103 mmHg, biatrial enlargement, severe TR, moderate mitral stenosis with mean gradient of 6 mmHg, dilated IVC.  She was seen back in the [facility] on 01/30/23 for post hospital f/u and noted recurrent melena, SOB and fatigued w/ minimal exertion. Also noted to have recurrent volume overload w/ marked return of LEE. Hgb was down to 6.6. She  was sent back to the ED and readmitted again for a/c CHF and GIB, treated w/ blood transfusions, IV Fe and diuretics. Discharged on 02/06/23 on torsemide  100 mg bid.   S/p 2u RBCs 04/26/23 for symptomatic anemia. Hgb 6.8.   Admitted 05/27/23 due to shortness of breath/ leg swelling. Initially  given IV lasix   with removal of ~ 4Land transitioned to oral torsemide  at 100mg  BID at discharge. Metolazone  decreased to weekly. Treated with antibiotics for leg cellulitis. Given 1 unit PRBC's due to hemoglobin trending down to 7.1. Wound care consulted due to weeping area on left shin. Palliative care also consulted.   Today she returns, with her niece, a for HF follow up visit, with her niece,with a chief complaint of leg pain/ swelling. She says the pain in her legs is worsening where she can't hardly stand it and the swelling in her legs is worsening. Elevating her legs but edema continues. She also says that the wound on her left shin appears to be draining more. She has continued shortness of breath, fatigue, chronic chest pain, palpitations, lightheadedness and abdominal distention.   Since last here she took an extra dose of metolazone / potassium without any change in swelling/ weight. She has also been on oral keflex  the last 4 days.    Cardiomems hasn't been transmitted since 04/15/23: goal 17. She is willing to try and transmit sitting in the recliner since she can't lay flat in the bed.    Labs (10/23): magnesium  1.7 Labs (3/24): TSH 2.93 Labs (4/24): K 3.9, creatinine 1.69, ALT 9, hgb 10, Plt 175 Labs (8/24): K 4.5, creatinine, 1.6 Labs (10/24): K 4.1, creatinine 1.55, Hgb 7.6 Labs (11/24): K 4.6, SCr 1.52 Labs (11/24): K 4.6, SCr 1.82 => 1.78, TSat 3, ferritin 16 Labs (12/24): K 3.9, creatinine 1.83 Labs (01/25): K 4.2, creatinine 1.89  PMH: 1. COPD 2. Renal cell carcinoma: S/p right kidney resection.  3. CKD stage 3 4. Fe deficiency anemia 5. Atrial fibrillation: Permanent. She has failed amiodarone , Tikosyn , and flecainide  as well as multiple cardioversions. 6. Chronic diastolic CHF: She has history of prior HFrEF with nonischemic cardiomyopathy.  She has a Medtronic ICD.   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  - Echo (6/20): EF 60-65%, mildly dilated RV with PASP 93  mmHg, mild-moderate TR.  - RHC (7/20): mean RA 11, PA 64/18 mean 39, mean PCWP 24 with v waves to 41, CI 4.21, PVR 1.9 WU.  - Cardiomems placement - RHC (11/20): mean RA 13, PA 63/23 mean 25 with v waves to 47 (MR only mild by echo), mean PCWP 25, CI 3.18, PVR 2.6 WU - Echo (11/21): EF 65-70%, moderate LVH, D-shaped septum with mildly decreased RV systolic function, RVSP 85.9 mmHg, severe RAE, severe TR, mod-sev AS. - Echo (2/23): EF 65-70% with mild LVH, D-shaped septum with moderate RV enlargement, and mildly decreased RV function, PASP 92, moderate-severe TR, dilated IVC, moderate aortic stenosis.  - Echo (10/23): EF 60-65%, D-shaped septum, mild RV enlargement with normal RV systolic function, PASP 90, moderate-severe TR, mild AS, IVC dilated.  - Echo (9/24): LVEF 65-70%, D-shaped septum, mildly reduced RV, RVSP 103 mmHg, biatrial enlargement, severe TR, moderate mitral stenosis with mean gradient of 6 mmHg, dilated IVC - Echo (01/25): EF 65-70%, mild LVH, RV moderately reduced, severely elevated PA pressure 83.9 mmHg, mild MR, moderate MS with mean gradient of 6.0 mmHg, IVC dialted 7. Hyperlipidemia 8. Carotid stenosis: Carotid dopplers (3/19) with 40-59% LICA stenosis.  9. Possible ischemic bowel in 11/20 10. 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 Conservative management per Dr. Melvenia.  - 10/22 bilateral CIA stents.  - 11/22 ABIs: 1.07 left, 0.83 right with patent CIA stents.  11. 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  12. Squamous cell skin cancer.  13. Neuroendocrine tumor of pancreatic head: Treating with sandostatin .  14. COVID 19 in 10/23    Current Outpatient Medications on File Prior to Visit  Medication Sig Dispense Refill   acetaminophen  (TYLENOL ) 500 MG tablet Take 1,000 mg by mouth every 8 (eight) hours.     Ascorbic Acid  (VITAMIN C  PO) Take 1 tablet by mouth daily.     cephALEXin  (KEFLEX ) 500 MG capsule Take  1 capsule (500 mg total) by mouth 2 (two) times daily. Take 1 tablet twice daily for 7 days 14 capsule 0   Cholecalciferol  (VITAMIN D -3 PO) Take 2 capsules by mouth daily.     CRANBERRY PO Take 1 tablet by mouth daily.     Cyanocobalamin  (B-12 PO) Take 1 tablet by mouth daily.     ferrous gluconate  (FERGON) 324 MG tablet Take 1 tablet (324 mg total) by mouth daily with breakfast. 30 tablet 3   fluticasone  (FLONASE ) 50 MCG/ACT nasal spray Place 2 sprays into both nostrils daily as needed for allergies or rhinitis. 16 g 0   metolazone  (ZAROXOLYN ) 2.5 MG tablet Take 1 tablet (2.5 mg total) by mouth once a week. Every Tuesday with 40 meq of potassium 90 tablet 3   Multiple Vitamins-Minerals (HAIR SKIN NAILS PO) Take 1 capsule by mouth daily.     pantoprazole  (PROTONIX ) 40 MG tablet Take 1 tablet (40 mg total) by mouth daily. (Patient taking differently: Take 40 mg by mouth daily before breakfast.) 30 tablet 3   potassium chloride  SA (KLOR-CON  M) 20 MEQ tablet Take 2 tablets (40 mEq total) by mouth every morning AND 1 tablet (20 mEq total) at bedtime. Take extra 2 tabs when you take metolazone . (Patient taking differently: Take 20 mEq by mouth in the morning and evening and an additional 20 mEq once daily on Sat only) 110 tablet 6   potassium chloride  SA (KLOR-CON  M) 20 MEQ tablet Take 1 tablet (20 mEq total) by mouth once a week. Weekly with Metolazone  dose every Saturday (Patient not taking: Reported on 06/09/2023) 15 tablet 6   rosuvastatin  (CRESTOR ) 40 MG tablet Take 1 tablet (40 mg total) by mouth daily. 30 tablet 11   torsemide  (DEMADEX ) 20 MG tablet Take 5 tablets (100 mg total) by mouth 2 (two) times daily.     traMADol  (ULTRAM ) 50 MG tablet Take 50 mg by mouth every 6 (six) hours as needed (for pain). (Patient not taking: Reported on 06/09/2023)     No current facility-administered medications on file prior to visit.     Allergies:   Dilaudid  [hydromorphone ], Fentanyl , Codeine, Morphine  and related,  Amoxicillin , Hydrocodone , Oxycodone , Tramadol , Whole blood, and Sulfonamide derivatives    Social History:  The patient  reports that she quit smoking about 21 years ago. Her smoking use included cigarettes. She has a 20.00 pack-year smoking history. She has never used smokeless tobacco. She reports that she does not drink alcohol and does not use drugs.    Family History:  The patient's family history includes Breast cancer in her cousin and another family member; Breast cancer (age of onset: 64) in her maternal aunt; Early death in her father;  Heart failure in her mother.    ROS:  Please see the history of present illness.   All other systems are personally reviewed and negative.    Vitals:   06/13/23 0856  BP: 136/83  Pulse: 72  SpO2: 100%  Weight: 141 lb 6.4 oz (64.1 kg)   Wt Readings from Last 3 Encounters:  06/13/23 141 lb 6.4 oz (64.1 kg)  06/09/23 141 lb (64 kg)  06/01/23 140 lb 3.4 oz (63.6 kg)   Lab Results  Component Value Date   CREATININE 1.69 (H) 06/09/2023   CREATININE 1.89 (H) 06/01/2023   CREATININE 1.81 (H) 05/31/2023    PHYSICAL EXAM:  General: Frail, chronically ill appearing. On 2L [place], arrived in wheelchair HEENT: normal Neck: supple, JVP elevated to earlobe Cor: Regular rhythm, rate. No rubs, gallops or murmurs Lungs: clear Abdomin: soft, nontender, distended. Extremities: no cyanosis, 2+ pitting edema bilateral lower legs; ruddy scaly bilateral shins, weeping wound left anterior shin appears worse & has noticeable odor upon entering the room, legs tender to palpation Neuro: alert & oriented X 3. Moves all 4 extremities w/o difficulty. Affect pleasant   ASSESSMENT AND PLAN:  1. Acute on Chronic diastolic CHF: With prominent RV failure. Echo in 6/20 showed EF 60-65%, mild RV dilation with normal systolic function, PASP 93 mmHg.  She had remote nonischemic cardiomyopathy with recovery of EF, has Medtronic ICD. Atrial fibrillation likely contributes, but  this appears to be permanent now as she has failed multiple anti-arrhythmics and is reasonably rate-controlled. RHC 11/20 showed elevated filling pressures and pulmonary venous hypertension. Prominent v-waves on PCWP tracing likely due to stiff ventricle/diastolic dysfunction as she had only mild MR on echo. Echo 10/23 with EF 60-65%, D-shaped septum, mild RV enlargement with normal RV systolic function, PASP 90, moderate-severe TR, mild AS, IVC dilated. Echo (9/24): LVEF 65-70%, D-shaped septum, mildly reduced RV, RVSP 103 mmHg, biatrial enlargement, severe TR, mild AS with mean gradient of 17 mmhg, moderate mitral stenosis with mean gradient of 6 mmHg, dilated IVC. She is RV pacing at a high percentage, but LV EF has remained normal.  - NYHA IIIb. Cardiomems goal is 17. Encouraged patient to use the wedge in the recliner to transmit  - Appears to be volume up on exam in abdomen and bilateral lower legs - weight unchanged from last visit 4 days ago - Continue torsemide  100 mg bid + 40/20 KCL - continue metolazone  2.5mg  w/ 40meq potassium weekly on Saturdays - Off SGLT2i after UTIs - discussed w/ Dr. Rolan who also talked w/ patient - BNP 06/09/23 reviewed and was 271.3 (improving)  2. Hypertension:  - BP 136/83 - BMET 06/09/23 reviewed and showed sodium 137, potassium 4.6, creatinine 1.69 & GFR 29  3. Pulmonary hypertension: PA systolic pressure estimated 83.9 mmHg on last echo in setting of volume overload.  - Based on RHC 11/20, she has primarily pulmonary venous hypertension, not candidate for pulmonary vasodilators.   4. CKD: Stage 4. Off SGLT2i - Labs 06/09/23 showed Scr 1.69 (improving)  5. Chronic anemia/GI bleeding: Due to small bowel AVMs.  - previous admissions for GI bleeding - being followed by Heme/Onc for her anemia. Has received Fe infusions - Hg 06/09/23 reviewed and was 8.1; CBC results were faxed to Dr Lanny on 06/09/23  6. Atrial fibrillation: Permanent. Rate control is reasonable.  She has failed amiodarone , Tikosyn , and flecainide  as well as multiple cardioversions.  - Off Eliquis  with recurrent bleeding - follows with EP Robyne)  7.  Neuroendocrine tumor of pancreatic head:  - followed by oncology - Treating palliative with sandostatin .   8. Cellulitis - started keflex  500mg  BID 4 days ago & wound appears to be worsening - noticeable odor present upon entering room which was not present at last visit a few days ago - lengthy discussion by Dr Rolan and myself regarding the need to go to the ER for likely IV antibiotics; we are concerned that this could turn into sepsis as it appears that she has failed outpatient antibiotics and that her health would struggle with becoming septic - offered to take patient directly to [facility] ER but she declined and said that she wants to go home first; explained my concern that she may get home and change her mind about returning - she says that she will return to the ER later today - niece was present during this discussion   Plan to f/u here in 1 month, sooner if needed pending hospital disposition.   Ellouise DELENA Class, NP 06/13/23

## 2023-06-13 NOTE — Patient Instructions (Signed)
 Go to the emergency room to have your legs looked at.

## 2023-06-13 NOTE — Telephone Encounter (Signed)
 This nurse received a Secure Chat from Bascom [place] in [place] regarding pt's PET Dotatate appt.  Bascom stated the pt called and cancelled her Dotatate PET scan and did not wish to reschedule.  Notified Dr. Lanny and her Team of the pt's request.  Cancelled order in EPIC.

## 2023-06-13 NOTE — ED Provider Notes (Signed)
 [facility] Provider Note    Event Date/Time   First MD Initiated Contact with Patient 06/13/23 1801     (approximate)   History   Shortness of Breath and Leg Swelling   HPI  [name] is a 89 y.o. female who presents to the ER for evaluation of leg swelling and foul-smelling drainage from wound.  Patient was started on Keflex  for lower extremity cellulitis earlier this week but is getting worse and was sent over from heart failure clinic due to concern for worsening leg swelling and cellulitis failing outpatient management.  She has had some chills no measured fevers.  Has been compliant with her medications.     Physical Exam   Triage Vital Signs: ED Triage Vitals  Encounter Vitals Group     BP 06/13/23 1545 (!) 133/58     Systolic BP Percentile --      Diastolic BP Percentile --      Pulse Rate 06/13/23 1545 60     Resp 06/13/23 1545 20     Temp 06/13/23 1545 98 F (36.7 C)     Temp Source 06/13/23 1545 Oral     SpO2 06/13/23 1545 100 %     Weight --      Height --      Head Circumference --      Peak Flow --      Pain Score 06/13/23 1541 4     Pain Loc --      Pain Education --      Exclude from Growth Chart --     Most recent vital signs: Vitals:   06/13/23 1545  BP: (!) 133/58  Pulse: 60  Resp: 20  Temp: 98 F (36.7 C)  SpO2: 100%     Constitutional: Alert  Eyes: Conjunctivae are normal.  Head: Atraumatic. Nose: No congestion/rhinnorhea. Mouth/Throat: Mucous membranes are moist.   Neck: Painless ROM.  Cardiovascular:   Good peripheral circulation. Respiratory: Normal respiratory effort.  No retractions.  Gastrointestinal: Soft and nontender.  Musculoskeletal:  no deformity Neurologic:  MAE spontaneously. No gross focal neurologic deficits are appreciated.  Skin: Chronic appearing venous stasis ulcers particular the left leg with foul smelling purulent drainage.  No crepitus no fluctuance. Psychiatric: Mood and  affect are normal. Speech and behavior are normal.    ED Results / Procedures / Treatments   Labs (all labs ordered are listed, but only abnormal results are displayed) Labs Reviewed  BASIC METABOLIC PANEL - Abnormal; Notable for the following components:      Result Value   Sodium 134 (*)    Chloride 93 (*)    BUN 58 (*)    Creatinine, Ser 1.86 (*)    Calcium  8.5 (*)    GFR, Estimated 26 (*)    All other components within normal limits  CBC - Abnormal; Notable for the following components:   RBC 3.16 (*)    Hemoglobin 7.8 (*)    HCT 27.1 (*)    MCH 24.7 (*)    MCHC 28.8 (*)    RDW 21.1 (*)    All other components within normal limits  BRAIN NATRIURETIC PEPTIDE - Abnormal; Notable for the following components:   B Natriuretic Peptide 349.6 (*)    All other components within normal limits  TROPONIN I (HIGH SENSITIVITY) - Abnormal; Notable for the following components:   Troponin I (High Sensitivity) 43 (*)    All other components within normal limits  TROPONIN  I (HIGH SENSITIVITY)     EKG  ED ECG REPORT I, Belvie Essex, the attending physician, personally viewed and interpreted this ECG.   Date: 06/13/2023  EKG Time: 15:44  Rate: 60  Rhythm: a-s v-p  Axis: left  Intervals: normal  ST&T Change: no stemi, no depressions    RADIOLOGY Please see ED Course for my review and interpretation.  I personally reviewed all radiographic images ordered to evaluate for the above acute complaints and reviewed radiology reports and findings.  These findings were personally discussed with the patient.  Please see medical record for radiology report.    PROCEDURES:  Critical Care performed: No  Procedures   MEDICATIONS ORDERED IN ED: Medications  bumetanide  (BUMEX ) injection 1 mg (has no administration in time range)     IMPRESSION / MDM / ASSESSMENT AND PLAN / ED COURSE  I reviewed the triage vital signs and the nursing notes.                               Differential diagnosis includes, but is not limited to, venous stasis, cellulitis, gangrene, sepsis, CHF  Patient presenting to the ER for evaluation of symptoms as described above.  Based on symptoms, risk factors and considered above differential, this presenting complaint could reflect a potentially life-threatening illness therefore the patient will be placed on continuous pulse oximetry and telemetry for monitoring.  Laboratory evaluation will be sent to evaluate for the above complaints.  Patient chronically ill-appearing with evidence of worsening lower extremity cellulitis failing outpatient management.  Will check venous ultrasound to ensure there is no evidence of obstructive clot.  Will give IV Bumex .  Will give IV vancomycin .  Will consult hospitalist for admission.       FINAL CLINICAL IMPRESSION(S) / ED DIAGNOSES   Final diagnoses:  Cellulitis of left lower extremity  Congestive heart failure, unspecified HF chronicity, unspecified heart failure type (HCC)     Rx / DC Orders   ED Discharge Orders     None        Note:  This document was prepared using Dragon voice recognition software and may include unintentional dictation errors.    Essex Belvie, MD 06/13/23 AMOS

## 2023-06-13 NOTE — Patient Outreach (Signed)
  Care Coordination   Follow Up Visit Note   06/13/2023 Late entry 06/10/23 Name: [name] MRN: [id number] DOB: 01-22-1934  [name] is a 89 y.o. year old female who sees [name], MD for primary care. I spoke with  [name] by phone today.  What matters to the patients health and wellness today?  Per chart review patient admitted 05/27/23- 06/01/23 for  HF exacerbation and had post hospital follow up with cardiologist on 06/09/23. Patient states she was given blood due to low hemoglobin and her leg was treated with antibiotic. She states she continues to have lower leg swelling, SOB and fatigue. She states she continues to have pain in her leg. She states she is not able to tolerate wrapping of her leg due to the swelling and pain. She reports wearing her Oxygen  around the clock. Patient states she is taking her fluid pills and antibiotic as advised at cardiology visit on 06/09/23.    Goals Addressed             This Visit's Progress    Education and management of health conditions       Interventions Today    Flowsheet Row Most Recent Value  Chronic Disease   Chronic disease during today's visit Congestive Heart Failure (CHF), Other  [(left foot skin cancer, anemia due to pancreatic tumor and AVM, Bilateral leg weeping/ lesions)]  General Interventions   General Interventions Discussed/Reviewed General Interventions Reviewed, Labs  [evaluation of current treatment plan for listed health conditions and patients adherence to plan as established by provider. Assessed for HF symptoms and/ or new or ongoing symptoms. Assesed leg/foot wound]  Doctor Visits Discussed/Reviewed Doctor Visits Reviewed  bethann upcoming provider visits. Advised to keep follow up appointments with providers. Advised to call and reschedule primary care provider visit.]  Education Interventions   Education Provided Provided Education  [reviewed sign/s symptoms of infection. Confirmed  patient continues to receive palliative care services. Confirmed patient able to maintain ADL's and has ongoing support at home.]  Mental Health Interventions   Mental Health Discussed/Reviewed Mental Health Reviewed, Other  [active listening and support.  Offered to refer to child psychotherapist for counseling / resources and additional support.]  Pharmacy Interventions   Pharmacy Dicussed/Reviewed Pharmacy Topics Reviewed  [reviewed medications and compliance discussed. Assessed patients pain level.]              SDOH assessments and interventions completed:  No     Care Coordination Interventions:  Yes, provided   Follow up plan: Follow up call scheduled for 07/14/23 at 2 pm    Encounter Outcome:  Patient Visit Completed   Palma Buster RN, BSN, CCM [place]  [facility], Population Health Case Manager Phone: [phone]

## 2023-06-13 NOTE — ED Notes (Signed)
 See triage notes. Patient c/o shortness of breath and bilateral leg swelling/wounds.

## 2023-06-13 NOTE — ED Triage Notes (Signed)
 Pt to ED via POV from home. Pt reports increased SOB and bilateral leg swelling/wounds. Pt on torsemide  for CHF. Pt currently being treated for pancreatic cancer. Pt is on 2L [place] chronically.

## 2023-06-13 NOTE — ED Notes (Addendum)
 Patient alert and oriented.  Laying in the bed.  So signs of distress.  Wounds on BLE. Open to air. Transferring room 34.

## 2023-06-13 NOTE — Patient Instructions (Signed)
 Visit Information  Thank you for taking time to visit with me today. Please don't hesitate to contact me if I can be of assistance to you.   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 Assessed for HF symptoms and/ or new or ongoing symptoms. Assesed leg/foot wound]  Doctor Visits Discussed/Reviewed Doctor Visits Reviewed  bethann upcoming provider visits. Advised to keep follow up appointments with providers. Advised to call and reschedule primary care provider visit.]  Education Interventions   Education Provided Provided Education  [reviewed sign/s symptoms of infection. Confirmed patient continues to receive palliative care services. Confirmed patient able to maintain ADL's and has ongoing support at home. Advised to elevate legs when sitting and laying down.]  Provided Verbal Education On Other  [Advised to contact provider for worsening symptoms or call 911 for life threatning symptoms/ worsening SOB.]  Mental Health Interventions   Mental Health Discussed/Reviewed Mental Health Reviewed, Other  [active listening and support.  Offered to refer to child psychotherapist for counseling / resources and additional support.]  Pharmacy Interventions   Pharmacy Dicussed/Reviewed Pharmacy Topics Reviewed  [reviewed medications and compliance discussed. Assessed patients pain level. Advised to take antibiotics until  completed.]              Our next appointment is by telephone on 07/14/23 at 2 pm  Please call the care guide team at [phone] if you need to cancel or reschedule your appointment.   If you are experiencing a Mental Health or Behavioral Health Crisis or need someone to talk to, please call the Suicide and Crisis Lifeline: 988 call 1-800-273-TALK (toll free, 24 hour hotline)  Patient verbalizes understanding of instructions and care plan provided today and agrees to view in MyChart. Active MyChart status and patient understanding of how to access instructions and care plan via MyChart confirmed with patient.     Arvin Seip RN, BSN, CCM [employer], Population Health Case Manager Phone: [phone]

## 2023-06-13 NOTE — H&P (Signed)
 [place]   PATIENT NAME: [name]    MR#:  [id number]  DATE OF BIRTH:  10-Raiven Belizaire-1935  DATE OF ADMISSION:  06/13/2023  PRIMARY CARE PHYSICIAN: [name], MD   Patient is coming from: Home  REQUESTING/REFERRING PHYSICIAN: Lang Dover, MD  CHIEF COMPLAINT:   Chief Complaint  Patient presents with   Shortness of Breath   Leg Swelling    HISTORY OF PRESENT ILLNESS:  KASAUNDRA FAHRNEY is a 89 y.o. female with medical history significant for diastolic CHF, atrial fibrillation, osteoarthritis, aortic valve stenosis, stage III CKD, COPD, coronary artery disease, hypertension and dyslipidemia, who presented to the emergency room with acute onset of worsening left lower extremity swelling with associated erythema and induration with warmth and tenderness.  She has significant scaling on both lower extremities.  Her left leg was much more swollen than the right.  She denies any nausea or vomiting or abdominal pain.  No chest pain or palpitations.  No cough or wheezing.  She was seen earlier today in the heart failure clinic and referred to the emergency room.  She was recently started on p.o. Keflex  for suspected cellulitis.  She noticed foul-smelling drainage from her wounds.  She has chills without measured fever.  ED Course: When the patient came to the ER, BP was 133/58, pulse oximetry of 100% on 2 L of O2 by nasal cannula and with otherwise normal vital signs.  Sodium was 134 and chloride 93 with a BUN of 58 and creatinine 1.86 compared to 48/1.69 with calcium  8.5.  BNP was 349.6 compared to 71.3 on 06/09/2023.  High-sensitivity troponin I was 43 twice.  CBC showed hemoglobin 7.8 hematocrit 27.1 compared to 8.1 and 28.9 on 06/09/2023 with microcytosis. EKG as reviewed by me : EKG showed atrial sensed ventricular paced rhythm with prolonged AV conduction rate of 60 Imaging: 2 view chest x-ray showed cardiomegaly with small pleural effusions and no substantial change  compared to 05/27/2023.  The patient was given 1 mg of IV Bumex  as well as IV vancomycin .  She will be admitted to a medical bed for further evaluation and management. PAST MEDICAL HISTORY:   Past Medical History:  Diagnosis Date   (HFpEF) heart failure with preserved ejection fraction (HCC)    EF=60-60%   Actinic keratosis 01/17/2015   R forearm   Adjustment disorder with anxiety    Adverse effect of other narcotics, sequela    Intolerance to all narcotics   Anti-Duffy antibodies present    Aortic valve stenosis    Arthritis    some in my hands (11/11/2012)   Atrial fibrillation, permanent (HCC)    Eliquis    Atypical mole 03/25/2018   L forearm - severe   Automatic implantable cardioverter-defibrillator in situ    Avascular necrosis of hip (HCC) 05/03/2011   Carotid artery stenosis 09/2007   a. 09/2007: 60-79% bilateral (stable); b. 10/2008: 40-59% R 60-79%    Cellulitis of left lower extremity 07/13/2020   CKD (chronic kidney disease), stage III (HCC)    COPD (chronic obstructive pulmonary disease) (HCC)    Coronary artery disease    non-obstructive by 2006 cath   COVID-19 virus infection 02/12/2022   Displaced fracture of left femoral neck (HCC) 10/09/2018   GI bleed 03/28/2020   AVM   High cholesterol    Hypertension 05/20/2011   Hypertr obst cardiomyop    Hypotension, unspecified    cardiac cath 2006..nonobstructive CAD 30-40s lesions.SABRAETT 1/09 nondiagnostic due to poor  HR response..Right Renal Cancer 2003   Iron  deficiency anemia    Long term (current) use of anticoagulants    On home oxygen  therapy    3L [place]   Osteoarthritis of right hip    PAD (peripheral artery disease) (HCC)    Pancreatic cancer (HCC)    sandostatin    PONV (postoperative nausea and vomiting)    Presence of permanent cardiac pacemaker    Pulmonary HTN (HCC)    RECTAL BLEEDING 10/13/2009   Qualifier: Diagnosis of  By: [place] NP, Paula     Red blood cell antibody positive with compatible PRBC  difficult to obtain    Anti FYA (Duffy a) antibody. Must be transfused with PRB which are Duffy Antigen Negative and Crossmatch Compatible   Renal cell carcinoma (HCC)    s/p nephrectomy   Squamous cell carcinoma of skin 01/17/2015   R lat wrist   Squamous cell carcinoma of skin 01/29/2018   R post upper leg - superficially invasive   Squamous cell carcinoma of skin 03/25/2018   L lat foot   Squamous cell carcinoma of skin 11/09/2020   left lat foot - EDC 01/01/21, recurrent 01/31/21 - MOHs 02/22/21   Squamous cell carcinoma of skin 12/19/2021   Right Posterior Medial Thigh, EDC   Squamous cell carcinoma of skin 12/19/2021   SCC IS, L lat heel, EDC 02/04/2022   Squamous cell carcinoma of skin 01/21/2022   L forearm, EDC 02/04/2022   Squamous cell carcinoma of skin 01/21/2022   R lower leg below knee, EDC 02/04/2022   Squamous cell carcinoma of skin 01/21/2022   SCCIS, R post heel, EDC 02/04/2022   Squamous cell carcinoma of skin 07/01/2022   left lower abdomen, in situ, EDC   Squamous cell carcinoma of skin 07/01/2022   left medial chest, EDC   Urge incontinence    Venous stasis of both lower extremities     PAST SURGICAL HISTORY:   Past Surgical History:  Procedure Laterality Date   ABDOMINAL AORTOGRAM W/LOWER EXTREMITY N/A 02/12/2021   Procedure: ABDOMINAL AORTOGRAM W/LOWER EXTREMITY;  Surgeon: Sheree Penne Bruckner, MD;  Location: [facility] INVASIVE CV LAB;  Service: Cardiovascular;  Laterality: N/A;   ABDOMINAL HYSTERECTOMY  1975   for benign causes   APPENDECTOMY     BI-VENTRICULAR PACEMAKER UPGRADE  05/04/2010   BIOPSY  02/28/2019   Procedure: BIOPSY;  Surgeon: Teressa Toribio SQUIBB, MD;  Location: [facility] ENDOSCOPY;  Service: Endoscopy;;   BIOPSY  03/04/2022   Procedure: BIOPSY;  Surgeon: Wilhelmenia Aloha Raddle., MD;  Location: [facility];  Service: Gastroenterology;;   CARDIAC CATHETERIZATION  2006   CARDIOVERSION N/A 02/20/2018   Procedure: CARDIOVERSION;  Surgeon: [name], MD;  Location: [facility] ORS;  Service: Cardiovascular;  Laterality: N/A;   CARDIOVERSION N/A 03/27/2018   Procedure: CARDIOVERSION;  Surgeon: [name], MD;  Location: [facility] ORS;  Service: Cardiovascular;  Laterality: N/A;   CATARACT EXTRACTION W/ INTRAOCULAR LENS  IMPLANT, BILATERAL  01/2006-02-2006   CHOLECYSTECTOMY N/A 11/11/2012   Procedure: LAPAROSCOPIC CHOLECYSTECTOMY WITH INTRAOPERATIVE CHOLANGIOGRAM;  Surgeon: Donnice POUR. Belinda, MD;  Location: [facility] OR;  Service: General;  Laterality: N/A;   COLONOSCOPY WITH PROPOFOL  N/A 02/28/2019   Procedure: COLONOSCOPY WITH PROPOFOL ;  Surgeon: Teressa Toribio SQUIBB, MD;  Location: [facility] ENDOSCOPY;  Service: Endoscopy;  Laterality: N/A;   ENTEROSCOPY N/A 03/30/2020   Procedure: ENTEROSCOPY;  Surgeon: San Sandor GAILS, DO;  Location: [facility] ENDOSCOPY;  Service: Gastroenterology;  Laterality: N/A;   ENTEROSCOPY N/A 02/10/2021  Procedure: ENTEROSCOPY;  Surgeon: Rollin Dover, MD;  Location: [facility] ENDOSCOPY;  Service: Endoscopy;  Laterality: N/A;   ENTEROSCOPY N/A 02/15/2022   Procedure: ENTEROSCOPY;  Surgeon: Federico Rosario BROCKS, MD;  Location: [facility] ENDOSCOPY;  Service: Gastroenterology;  Laterality: N/A;   ENTEROSCOPY N/A 11/06/2022   Procedure: ENTEROSCOPY;  Surgeon: Wilhelmenia Aloha Raddle., MD;  Location: [facility] ENDOSCOPY;  Service: Gastroenterology;  Laterality: N/A;   EP IMPLANTABLE DEVICE N/A 02/21/2016   Procedure: ICD Generator Changeout;  Surgeon: Elspeth BROCKS Sage, MD;  Location: [facility] INVASIVE CV LAB;  Service: Cardiovascular;  Laterality: N/A;   ESOPHAGOGASTRODUODENOSCOPY N/A 03/04/2022   Procedure: ESOPHAGOGASTRODUODENOSCOPY (EGD);  Surgeon: Wilhelmenia Aloha Raddle., MD;  Location: [facility];  Service: Gastroenterology;  Laterality: N/A;   ESOPHAGOGASTRODUODENOSCOPY (EGD) WITH PROPOFOL  N/A 02/28/2019   Procedure: ESOPHAGOGASTRODUODENOSCOPY (EGD) WITH PROPOFOL ;  Surgeon: Teressa Toribio SQUIBB, MD;  Location: [facility] ENDOSCOPY;  Service: Endoscopy;  Laterality: N/A;   EUS  N/A 03/04/2022   Procedure: UPPER ENDOSCOPIC ULTRASOUND (EUS) RADIAL;  Surgeon: Wilhelmenia Aloha Raddle., MD;  Location: [facility] ENDOSCOPY;  Service: Gastroenterology;  Laterality: N/A;   FINE NEEDLE ASPIRATION N/A 03/04/2022   Procedure: FINE NEEDLE ASPIRATION (FNA) LINEAR;  Surgeon: Wilhelmenia Aloha Raddle., MD;  Location: [facility] ENDOSCOPY;  Service: Gastroenterology;  Laterality: N/A;   GIVENS CAPSULE STUDY N/A 03/15/2019   Procedure: GIVENS CAPSULE STUDY;  Surgeon: Eda Iha, MD;  Location: [facility] ENDOSCOPY;  Service: Gastroenterology;  Laterality: N/A;   HOT HEMOSTASIS N/A 03/30/2020   Procedure: HOT HEMOSTASIS (ARGON PLASMA COAGULATION/BICAP);  Surgeon: San Sandor GAILS, DO;  Location: [facility] ENDOSCOPY;  Service: Gastroenterology;  Laterality: N/A;   HOT HEMOSTASIS N/A 02/10/2021   Procedure: HOT HEMOSTASIS (ARGON PLASMA COAGULATION/BICAP);  Surgeon: Rollin Dover, MD;  Location: [facility] ENDOSCOPY;  Service: Endoscopy;  Laterality: N/A;   HOT HEMOSTASIS N/A 02/15/2022   Procedure: HOT HEMOSTASIS (ARGON PLASMA COAGULATION/BICAP);  Surgeon: Federico Rosario BROCKS, MD;  Location: [facility] ENDOSCOPY;  Service: Gastroenterology;  Laterality: N/A;   HOT HEMOSTASIS N/A 11/06/2022   Procedure: HOT HEMOSTASIS (ARGON PLASMA COAGULATION/BICAP);  Surgeon: Wilhelmenia Aloha Raddle., MD;  Location: [facility] ENDOSCOPY;  Service: Gastroenterology;  Laterality: N/A;   INSERT / REPLACE / REMOVE PACEMAKER  05-01-11   02-28-05-/05-04-10-ICD-MEDTRONIC MAXIMAL DR   IR ANGIOGRAM SELECTIVE EACH ADDITIONAL VESSEL  02/18/2022   IR ANGIOGRAM SELECTIVE EACH ADDITIONAL VESSEL  02/18/2022   IR ANGIOGRAM VISCERAL SELECTIVE  02/16/2022   IR ANGIOGRAM VISCERAL SELECTIVE  02/16/2022   IR EMBO ART  VEN HEMORR LYMPH EXTRAV  INC GUIDE ROADMAPPING  02/16/2022   IR THORACENTESIS ASP PLEURAL SPACE W/IMG GUIDE  02/21/2022   IR THORACENTESIS ASP PLEURAL SPACE W/IMG GUIDE  02/22/2022   IR US  GUIDE VASC ACCESS RIGHT  02/16/2022   JOINT REPLACEMENT     LAPAROSCOPIC  CHOLECYSTECTOMY  11/11/2012   LAPAROSCOPIC LYSIS OF ADHESIONS N/A 11/11/2012   Procedure: LAPAROSCOPIC LYSIS OF ADHESIONS;  Surgeon: Donnice POUR. Belinda, MD;  Location: [facility] OR;  Service: General;  Laterality: N/A;   NEPHRECTOMY Right 06/2001    S/P RENAL CELL CANCER   PERIPHERAL VASCULAR INTERVENTION Bilateral 02/12/2021   Procedure: PERIPHERAL VASCULAR INTERVENTION;  Surgeon: Sheree Penne Bruckner, MD;  Location: [facility] INVASIVE CV LAB;  Service: Cardiovascular;  Laterality: Bilateral;  Iliac artery stents   PRESSURE SENSOR/CARDIOMEMS N/A 02/03/2019   Procedure: PRESSURE SENSOR/CARDIOMEMS;  Surgeon: [name], MD;  Location: [facility] INVASIVE CV LAB;  Service: Cardiovascular;  Laterality: N/A;   RIGHT HEART CATH N/A 11/09/2018   Procedure: RIGHT HEART CATH;  Surgeon: [name],  MD;  Location: [facility] INVASIVE CV LAB;  Service: Cardiovascular;  Laterality: N/A;   RIGHT HEART CATH N/A 03/08/2019   Procedure: RIGHT HEART CATH;  Surgeon: [name], MD;  Location: [facility] INVASIVE CV LAB;  Service: Cardiovascular;  Laterality: N/A;   SUBMUCOSAL TATTOO INJECTION  02/28/2019   Procedure: SUBMUCOSAL TATTOO INJECTION;  Surgeon: Teressa Toribio SQUIBB, MD;  Location: [facility] ENDOSCOPY;  Service: Endoscopy;;   SUBMUCOSAL TATTOO INJECTION  11/06/2022   Procedure: SUBMUCOSAL TATTOO INJECTION;  Surgeon: Wilhelmenia Aloha Raddle., MD;  Location: [facility] ENDOSCOPY;  Service: Gastroenterology;;   TOTAL HIP ARTHROPLASTY Right 05/03/2011   Procedure: TOTAL HIP ARTHROPLASTY ANTERIOR APPROACH;  Surgeon: Lonni CINDERELLA Poli;  Location: [facility] ORS;  Service: Orthopedics;  Laterality: Right;  Removal of Cannulated Screws Right Hip, Right Direct Anterior Hip Replacement   TOTAL HIP ARTHROPLASTY Left 10/09/2018   Procedure: TOTAL HIP ARTHROPLASTY ANTERIOR APPROACH;  Surgeon: Fidel Rogue, MD;  Location: [facility] OR;  Service: Orthopedics;  Laterality: Left;    SOCIAL HISTORY:   Social History   Tobacco Use   Smoking status: Former    Current  packs/day: 0.00    Average packs/day: 0.5 packs/day for 40.0 years (20.0 ttl pk-yrs)    Types: Cigarettes    Start date: 05/06/1961    Quit date: 05/06/2001    Years since quitting: 22.1   Smokeless tobacco: Never  Substance Use Topics   Alcohol use: No    Alcohol/week: 0.0 standard drinks of alcohol    FAMILY HISTORY:   Family History  Problem Relation Age of Onset   Heart failure Mother    Early death Father        car accident   Breast cancer Maternal Aunt 22   Breast cancer Cousin    Breast cancer Other    Colon cancer Neg Hx    Esophageal cancer Neg Hx    Liver disease Neg Hx    Pancreatic cancer Neg Hx    Rectal cancer Neg Hx     DRUG ALLERGIES:   Allergies  Allergen Reactions   Codeine Nausea And Vomiting and Other (See Comments)    Hallucinations, too- CANNOT HAVE; thinks she may have received Narcan  to reverse the effect   Dilaudid  [Hydromorphone ] Other (See Comments)    Excessive Somnolence- Required Narcan  X2   Tape Other (See Comments)    CAN TOLERATE ONLY EASY-RELEASE, PAPER TAPE, AS THE SKIN IS VERY THIN AND WILL BRUISE AND TEAR VERY EASILY   Amoxil  [Amoxicillin ] Nausea Only   Asa [Aspirin ] Other (See Comments)    Told to avoid due to reduced kidney function, has 1 remaining kidney   Ms Contin  [Morphine ] Nausea And Vomiting and Other (See Comments)    Hallucinations, also   Neurontin  [Gabapentin ] Other (See Comments)    Out of it   Nsaids Other (See Comments)    Told to avoid due to reduced kidney function, has 1 remaining kidney    REVIEW OF SYSTEMS:   ROS As per history of present illness. All pertinent systems were reviewed above. Constitutional, HEENT, cardiovascular, respiratory, GI, GU, musculoskeletal, neuro, psychiatric, endocrine, integumentary and hematologic systems were reviewed and are otherwise negative/unremarkable except for positive findings mentioned above in the HPI.   MEDICATIONS AT HOME:   Prior to Admission medications    Medication Sig Start Date End Date Taking? Authorizing Provider  acetaminophen  (TYLENOL ) 500 MG tablet Take 1,000 mg by mouth every 8 (eight) hours.    [provider]  Ascorbic Acid  (VITAMIN  C PO) Take 1 tablet by mouth daily.    [provider]  cephALEXin  (KEFLEX ) 500 MG capsule Take 1 capsule (500 mg total) by mouth 2 (two) times daily. Take 1 tablet twice daily for 7 days 06/09/23   [place] A, FNP  Cholecalciferol  (VITAMIN D -3 PO) Take 2 capsules by mouth daily.    [provider]  CRANBERRY PO Take 1 tablet by mouth daily.    [provider]  Cyanocobalamin  (B-12 PO) Take 1 tablet by mouth daily.    [provider]  ferrous gluconate  (FERGON) 324 MG tablet Take 1 tablet (324 mg total) by mouth daily with breakfast. 11/15/22   Rai, Ripudeep K, MD  fluticasone  (FLONASE ) 50 MCG/ACT nasal spray Place 2 sprays into both nostrils daily as needed for allergies or rhinitis. 07/04/22   [name], MD  metolazone  (ZAROXOLYN ) 2.5 MG tablet Take 1 tablet (2.5 mg total) by mouth once a week. Every Tuesday with 40 meq of potassium 06/01/23   Danford, Lonni SQUIBB, MD  Multiple Vitamins-Minerals (HAIR SKIN NAILS PO) Take 1 capsule by mouth daily.    [provider]  pantoprazole  (PROTONIX ) 40 MG tablet Take 1 tablet (40 mg total) by mouth daily. Patient taking differently: Take 40 mg by mouth daily before breakfast. 11/09/22   Rai, Nydia POUR, MD  potassium chloride  SA (KLOR-CON  M) 20 MEQ tablet Take 2 tablets (40 mEq total) by mouth every morning AND 1 tablet (20 mEq total) at bedtime. Take extra 2 tabs when you take metolazone . Patient taking differently: Take 20 mEq by mouth in the morning and evening and an additional 20 mEq once daily on Sat only 03/07/23   Bensimhon, Toribio SAUNDERS, MD  potassium chloride  SA (KLOR-CON  M) 20 MEQ tablet Take 1 tablet (20 mEq total) by mouth once a week. Weekly with Metolazone  dose every Saturday 03/18/23   Hayes Beckey CROME, NP  rosuvastatin  (CRESTOR ) 40 MG tablet Take 1 tablet (40 mg total) by mouth daily. 05/02/22   [name], MD  torsemide  (DEMADEX ) 20 MG tablet Take 5 tablets (100 mg total) by mouth 2 (two) times daily. 01/18/23   [place], Camellia PARAS, DO      VITAL SIGNS:  Blood pressure (!) 111/42, pulse 66, temperature 97.8 F (36.6 C), temperature source Oral, resp. rate (!) 24, height 5' 5.98 (1.676 m), weight 63.5 kg, SpO2 100%.  PHYSICAL EXAMINATION:  Physical Exam  GENERAL:  89 y.o.-year-old patient lying in the bed with no acute distress.  EYES: Pupils equal, round, reactive to light and accommodation. No scleral icterus. Extraocular muscles intact.  HEENT: Head atraumatic, normocephalic. Oropharynx and nasopharynx clear.  NECK:  Supple, no jugular venous distention. No thyroid  enlargement, no tenderness.  LUNGS: Normal breath sounds bilaterally, no wheezing, rales,rhonchi or crepitation. No use of accessory muscles of respiration.  CARDIOVASCULAR: Regular rate and rhythm, S1, S2 normal. No murmurs, rubs, or gallops.  ABDOMEN: Soft, nondistended, nontender. Bowel sounds present. No organomegaly or mass.  EXTREMITIES: No pedal edema, cyanosis, or clubbing.  NEUROLOGIC: Cranial nerves II through XII are intact. Muscle strength 5/5 in all extremities. Sensation intact. Gait not checked.  PSYCHIATRIC: The patient is alert and oriented x 3.  Normal affect and good eye contact. SKIN: Bilateral lower extremity edema with erythema, induration, discoloration likely from postdermatitic hyperpigmentation with more swelling on the left with more induration, warmth and tenderness likely from an infected wound with significant scaling bilaterally more on the left.  LABORATORY PANEL:  CBC Recent Labs  Lab 06/13/23 1552  WBC 5.3  HGB 7.8*  HCT 27.1*  PLT 243   ------------------------------------------------------------------------------------------------------------------  Chemistries  Recent  Labs  Lab 06/13/23 1552  NA 134*  K 4.0  CL 93*  CO2 29  GLUCOSE 99  BUN 58*  CREATININE 1.86*  CALCIUM  8.5*   ------------------------------------------------------------------------------------------------------------------  Cardiac Enzymes No results for input(s): TROPONINI in the last 168 hours. ------------------------------------------------------------------------------------------------------------------  RADIOLOGY:  US  Venous Img Lower Bilateral Result Date: 06/13/2023 CLINICAL DATA:  Bilateral lower extremity swelling. EXAM: BILATERAL LOWER EXTREMITY VENOUS DOPPLER ULTRASOUND TECHNIQUE: Gray-scale sonography with graded compression, as well as color Doppler and duplex ultrasound were performed to evaluate the lower extremity deep venous systems from the level of the common femoral vein and including the common femoral, femoral, profunda femoral, popliteal and calf veins including the posterior tibial, peroneal and gastrocnemius veins when visible. The superficial great saphenous vein was also interrogated. Spectral Doppler was utilized to evaluate flow at rest and with distal augmentation maneuvers in the common femoral, femoral and popliteal veins. COMPARISON:  None Available. FINDINGS: RIGHT LOWER EXTREMITY Common Femoral Vein: No evidence of thrombus. Normal compressibility, respiratory phasicity and response to augmentation. Saphenofemoral Junction: No evidence of thrombus. Normal compressibility and flow on color Doppler imaging. Profunda Femoral Vein: No evidence of thrombus. Normal compressibility and flow on color Doppler imaging. Femoral Vein: No evidence of thrombus. Normal compressibility, respiratory phasicity and response to augmentation. Popliteal Vein: No evidence of thrombus. Normal compressibility, respiratory phasicity and response to augmentation. Calf Veins: The RIGHT posterior tibial vein and RIGHT peroneal vein are poorly visualized secondary to right lower  extremity edema and ulcers. Superficial Great Saphenous Vein: No evidence of thrombus. Normal compressibility. Venous Reflux:  None. Other Findings:  None. LEFT LOWER EXTREMITY Common Femoral Vein: No evidence of thrombus. Normal compressibility, respiratory phasicity and response to augmentation. Saphenofemoral Junction: No evidence of thrombus. Normal compressibility and flow on color Doppler imaging. Profunda Femoral Vein: No evidence of thrombus. Normal compressibility and flow on color Doppler imaging. Femoral Vein: No evidence of thrombus. Normal compressibility, respiratory phasicity and response to augmentation. Popliteal Vein: No evidence of thrombus. Normal compressibility, respiratory phasicity and response to augmentation. Calf Veins: The LEFT posterior tibial vein and LEFT peroneal vein are poorly visualized secondary to left lower extremity edema and ulcers. Superficial Great Saphenous Vein: No evidence of thrombus. Normal compressibility. Venous Reflux:  None. Other Findings:  None. IMPRESSION: Limited evaluation of the BILATERAL posterior tibial veins and BILATERAL peroneal veins, without evidence of deep venous thrombosis in either lower extremity. Electronically Signed   By: Suzen Dials M.D.   On: 06/13/2023 19:44   DG Chest 2 View Result Date: 06/13/2023 CLINICAL DATA:  Worsening chest pain and shortness of breath. History of pancreatic cancer. EXAM: CHEST - 2 VIEW COMPARISON:  05/27/2023 FINDINGS: The cardio pericardial silhouette is enlarged. Bilateral pulmonary nodules again noted which appear calcified consistent with granulomata. Small bilateral pleural effusions. Left-sided permanent pacemaker/AICD again noted. Bones are diffusely demineralized. IMPRESSION: 1. No substantial change. Cardiomegaly with small pleural effusions. Electronically Signed   By: Camellia Candle M.D.   On: 06/13/2023 17:01      IMPRESSION AND PLAN:  Assessment and Plan: Cellulitis of left lower  extremity - The patient will be admitted to a medical bed. - Will continue antibiotic therapy with IV Rocephin  and vancomycin . - Wound care consult to be obtained. - The patient had negative bilateral lower extremity venous Doppler for DVT. - Wound  care consult to be obtained. - Wound Gram stain culture and sensitivity be obtained. - We will follow blood cultures.  Acute on chronic diastolic CHF (congestive heart failure) (HCC) - The patient's most recent 2D echo on 05/28/2023 revealed an EF of 65 to 70% and diastolic function was indeterminant then. - Will be diuresed with IV Lasix . - We will resume Zaroxolyn . - We will continue potassium supplementation.  Dyslipidemia - We will continue statin therapy.  GERD without esophagitis - We will continue PPI therapy.       DVT prophylaxis: Lovenox .  Advanced Care Planning:  Code Status: The patient is DNR and DNI. Family Communication:  The plan of care was discussed in details with the patient (and family). I answered all questions. The patient agreed to proceed with the above mentioned plan. Further management will depend upon hospital course. Disposition Plan: Back to previous home environment Consults called: none.  All the records are reviewed and case discussed with ED provider.  Status is: Inpatient    At the time of the admission, it appears that the appropriate admission status for this patient is inpatient.  This is judged to be reasonable and necessary in order to provide the required intensity of service to ensure the patient's safety given the presenting symptoms, physical exam findings and initial radiographic and laboratory data in the context of comorbid conditions.  The patient requires inpatient status due to high intensity of service, high risk of further deterioration and high frequency of surveillance required.  I certify that at the time of admission, it is my clinical judgment that the patient will require  inpatient hospital care extending more than 2 midnights.                            Dispo: The patient is from: Home              Anticipated d/c is to: Home              Patient currently is not medically stable to d/c.              Difficult to place patient: No  Madison DELENA Peaches M.D on 06/14/2023 at 1:50 AM  [facility]   From 7 PM-7 AM, contact night-coverage [email or web address]  CC: Primary care physician; [name], MD

## 2023-06-14 DIAGNOSIS — E785 Hyperlipidemia, unspecified: Secondary | ICD-10-CM

## 2023-06-14 DIAGNOSIS — K219 Gastro-esophageal reflux disease without esophagitis: Secondary | ICD-10-CM

## 2023-06-14 DIAGNOSIS — C44709 Unspecified malignant neoplasm of skin of left lower limb, including hip: Secondary | ICD-10-CM

## 2023-06-14 DIAGNOSIS — L03116 Cellulitis of left lower limb: Secondary | ICD-10-CM | POA: Diagnosis not present

## 2023-06-14 LAB — CBC
HCT: 24.4 % — ABNORMAL LOW (ref 36.0–46.0)
Hemoglobin: 7.2 g/dL — ABNORMAL LOW (ref 12.0–15.0)
MCH: 24.3 pg — ABNORMAL LOW (ref 26.0–34.0)
MCHC: 29.5 g/dL — ABNORMAL LOW (ref 30.0–36.0)
MCV: 82.4 fL (ref 80.0–100.0)
Platelets: 233 10*3/uL (ref 150–400)
RBC: 2.96 MIL/uL — ABNORMAL LOW (ref 3.87–5.11)
RDW: 21.4 % — ABNORMAL HIGH (ref 11.5–15.5)
WBC: 5 10*3/uL (ref 4.0–10.5)
nRBC: 0 % (ref 0.0–0.2)

## 2023-06-14 LAB — BASIC METABOLIC PANEL
Anion gap: 12 (ref 5–15)
BUN: 62 mg/dL — ABNORMAL HIGH (ref 8–23)
CO2: 27 mmol/L (ref 22–32)
Calcium: 8.3 mg/dL — ABNORMAL LOW (ref 8.9–10.3)
Chloride: 97 mmol/L — ABNORMAL LOW (ref 98–111)
Creatinine, Ser: 1.66 mg/dL — ABNORMAL HIGH (ref 0.44–1.00)
GFR, Estimated: 30 mL/min — ABNORMAL LOW (ref >60)
Glucose, Bld: 125 mg/dL — ABNORMAL HIGH (ref 70–99)
Potassium: 3.5 mmol/L (ref 3.5–5.1)
Sodium: 136 mmol/L (ref 135–145)

## 2023-06-14 MED ORDER — SODIUM CHLORIDE 0.9 % IV SOLN
2.0000 g | INTRAVENOUS | Status: DC
  Administered 2023-06-14 – 2023-06-19 (×6): 2 g via INTRAVENOUS
  Filled 2023-06-14 (×6): qty 20

## 2023-06-14 MED ORDER — FUROSEMIDE 10 MG/ML IJ SOLN
40.0000 mg | Freq: Every day | INTRAMUSCULAR | Status: DC
  Administered 2023-06-14 – 2023-06-16 (×3): 40 mg via INTRAVENOUS
  Filled 2023-06-14 (×3): qty 4

## 2023-06-14 MED ORDER — VANCOMYCIN HCL 500 MG/100ML IV SOLN
500.0000 mg | INTRAVENOUS | Status: DC
  Administered 2023-06-14 – 2023-06-15 (×2): 500 mg via INTRAVENOUS
  Filled 2023-06-14 (×3): qty 100

## 2023-06-14 MED ORDER — METOLAZONE 2.5 MG PO TABS: 2.5000 mg | ORAL_TABLET | ORAL | Status: DC

## 2023-06-14 MED ORDER — METOLAZONE 2.5 MG PO TABS
2.5000 mg | ORAL_TABLET | ORAL | Status: DC
  Administered 2023-06-17: 2.5 mg via ORAL
  Filled 2023-06-14: qty 1

## 2023-06-14 MED ORDER — POTASSIUM CHLORIDE CRYS ER 20 MEQ PO TBCR
20.0000 meq | EXTENDED_RELEASE_TABLET | ORAL | Status: DC
  Administered 2023-06-14: 20 meq via ORAL
  Filled 2023-06-14: qty 1

## 2023-06-14 MED ORDER — POTASSIUM CHLORIDE CRYS ER 20 MEQ PO TBCR
20.0000 meq | EXTENDED_RELEASE_TABLET | Freq: Two times a day (BID) | ORAL | Status: DC
  Administered 2023-06-14 – 2023-06-19 (×12): 20 meq via ORAL
  Filled 2023-06-14 (×12): qty 1

## 2023-06-14 NOTE — Progress Notes (Signed)
 PROGRESS NOTE    [name]  FMW:[id number] DOB: August 13, 1933 DOA: 06/13/2023 PCP: [name], MD  Assessment & Plan:   Active Problems:   Cellulitis of left lower extremity   Acute on chronic diastolic CHF (congestive heart failure) (HCC)   Dyslipidemia   GERD without esophagitis  Assessment and Plan: Cellulitis of left lower extremity: continue on IV rocephin , vanco. Wound care consulted. US  of b/l LE neg for DVT. Will consult ID on 06/16/23 as they are not available over the weekends. Hx of skin cancer (unknown type as per pt, possible squamous cell carcinoma as per H&P) of left foot that is managed by derm outpatient. Skin cancer has likely reoccurred & pt may need a repeat biopsy outpatient   Acute on chronic diastolic CHF: echo on 05/28/2023 revealed an EF of 65 to 70% and diastolic function was indeterminant then. Continue on IV lasix . Monitor I/Os. Holding home dose of torsemide    Chronic hypoxic respiratory failure: continue on supplemental oxygen , uses 2L [place] chronically    HLD: continue on statin    GERD: continue on PPI     DVT prophylaxis: lovenox   Code Status: DNR Family Communication:  Disposition Plan: depends on PT/OT recs (not consulted yet)   Level of care: Med-Surg  Status is: Inpatient Remains inpatient appropriate because: severity of illness    Consultants:    Procedures:   Antimicrobials: rocephin , vanco   Subjective: Pt c/o left foot pain   Objective: Vitals:   06/14/23 0055 06/14/23 0134 06/14/23 0424 06/14/23 0800  BP: (!) 111/42 (!) 111/42 (!) 115/47 (!) 118/45  Pulse: 66 66 69 60  Resp: (!) 24 (!) 24 20 16   Temp: 97.8 F (36.6 C) 97.8 F (36.6 C) 99.5 F (37.5 C) 98.1 F (36.7 C)  TempSrc: Oral Oral Oral Oral  SpO2: 100%  99% 98%  Weight:  63.5 kg    Height:  5' 5.98 (1.676 m)      Intake/Output Summary (Last 24 hours) at 06/14/2023 0823 Last data filed at 06/13/2023 2228 Gross per 24 hour  Intake 233.87 ml   Output --  Net 233.87 ml   Filed Weights   06/14/23 0134  Weight: 63.5 kg    Examination:  General exam: Appears calm but  uncomfortable  Respiratory system: Clear to auscultation. Respiratory effort normal. Cardiovascular system: S1 & S2+. No rubs, gallops or clicks. Gastrointestinal system: Abdomen is nondistended, soft and nontender. Normal bowel sounds heard. Central nervous system: Alert and oriented. Moves all extremities  Psychiatry: Judgement and insight appear normal. Mood & affect appropriate.     Data Reviewed: I have personally reviewed following labs and imaging studies  CBC: Recent Labs  Lab 06/09/23 1456 06/13/23 1552 06/14/23 0618  WBC 5.6 5.3 5.0  HGB 8.1* 7.8* 7.2*  HCT 28.9* 27.1* 24.4*  MCV 84 85.8 82.4  PLT 256 243 233   Basic Metabolic Panel: Recent Labs  Lab 06/09/23 1456 06/13/23 1552 06/14/23 0618  NA 137 134* 136  K 4.6 4.0 3.5  CL 96 93* 97*  CO2 28 29 27   GLUCOSE 104* 99 125*  BUN 48* 58* 62*  CREATININE 1.69* 1.86* 1.66*  CALCIUM  9.0 8.5* 8.3*   GFR: Estimated Creatinine Clearance: 22.4 mL/min (A) (by C-G formula based on SCr of 1.66 mg/dL (H)). Liver Function Tests: No results for input(s): AST, ALT, ALKPHOS, BILITOT, PROT, ALBUMIN  in the last 168 hours. No results for input(s): LIPASE, AMYLASE in the last 168 hours. No results for  input(s): AMMONIA in the last 168 hours. Coagulation Profile: No results for input(s): INR, PROTIME in the last 168 hours. Cardiac Enzymes: No results for input(s): CKTOTAL, CKMB, CKMBINDEX, TROPONINI in the last 168 hours. BNP (last 3 results) No results for input(s): PROBNP in the last 8760 hours. HbA1C: No results for input(s): HGBA1C in the last 72 hours. CBG: No results for input(s): GLUCAP in the last 168 hours. Lipid Profile: No results for input(s): CHOL, HDL, LDLCALC, TRIG, CHOLHDL, LDLDIRECT in the last 72 hours. Thyroid  Function  Tests: No results for input(s): TSH, T4TOTAL, FREET4, T3FREE, THYROIDAB in the last 72 hours. Anemia Panel: No results for input(s): VITAMINB12, FOLATE, FERRITIN, TIBC, IRON , RETICCTPCT in the last 72 hours. Sepsis Labs: No results for input(s): PROCALCITON, LATICACIDVEN in the last 168 hours.  No results found for this or any previous visit (from the past 240 hours).       Radiology Studies: US  Venous Img Lower Bilateral Result Date: 06/13/2023 CLINICAL DATA:  Bilateral lower extremity swelling. EXAM: BILATERAL LOWER EXTREMITY VENOUS DOPPLER ULTRASOUND TECHNIQUE: Gray-scale sonography with graded compression, as well as color Doppler and duplex ultrasound were performed to evaluate the lower extremity deep venous systems from the level of the common femoral vein and including the common femoral, femoral, profunda femoral, popliteal and calf veins including the posterior tibial, peroneal and gastrocnemius veins when visible. The superficial great saphenous vein was also interrogated. Spectral Doppler was utilized to evaluate flow at rest and with distal augmentation maneuvers in the common femoral, femoral and popliteal veins. COMPARISON:  None Available. FINDINGS: RIGHT LOWER EXTREMITY Common Femoral Vein: No evidence of thrombus. Normal compressibility, respiratory phasicity and response to augmentation. Saphenofemoral Junction: No evidence of thrombus. Normal compressibility and flow on color Doppler imaging. Profunda Femoral Vein: No evidence of thrombus. Normal compressibility and flow on color Doppler imaging. Femoral Vein: No evidence of thrombus. Normal compressibility, respiratory phasicity and response to augmentation. Popliteal Vein: No evidence of thrombus. Normal compressibility, respiratory phasicity and response to augmentation. Calf Veins: The RIGHT posterior tibial vein and RIGHT peroneal vein are poorly visualized secondary to right lower extremity edema and  ulcers. Superficial Great Saphenous Vein: No evidence of thrombus. Normal compressibility. Venous Reflux:  None. Other Findings:  None. LEFT LOWER EXTREMITY Common Femoral Vein: No evidence of thrombus. Normal compressibility, respiratory phasicity and response to augmentation. Saphenofemoral Junction: No evidence of thrombus. Normal compressibility and flow on color Doppler imaging. Profunda Femoral Vein: No evidence of thrombus. Normal compressibility and flow on color Doppler imaging. Femoral Vein: No evidence of thrombus. Normal compressibility, respiratory phasicity and response to augmentation. Popliteal Vein: No evidence of thrombus. Normal compressibility, respiratory phasicity and response to augmentation. Calf Veins: The LEFT posterior tibial vein and LEFT peroneal vein are poorly visualized secondary to left lower extremity edema and ulcers. Superficial Great Saphenous Vein: No evidence of thrombus. Normal compressibility. Venous Reflux:  None. Other Findings:  None. IMPRESSION: Limited evaluation of the BILATERAL posterior tibial veins and BILATERAL peroneal veins, without evidence of deep venous thrombosis in either lower extremity. Electronically Signed   By: Suzen Dials M.D.   On: 06/13/2023 19:44   DG Chest 2 View Result Date: 06/13/2023 CLINICAL DATA:  Worsening chest pain and shortness of breath. History of pancreatic cancer. EXAM: CHEST - 2 VIEW COMPARISON:  05/27/2023 FINDINGS: The cardio pericardial silhouette is enlarged. Bilateral pulmonary nodules again noted which appear calcified consistent with granulomata. Small bilateral pleural effusions. Left-sided permanent pacemaker/AICD again noted. Bones are diffusely demineralized.  IMPRESSION: 1. No substantial change. Cardiomegaly with small pleural effusions. Electronically Signed   By: Camellia Candle M.D.   On: 06/13/2023 17:01        Scheduled Meds:  ascorbic acid   500 mg Oral Daily   cyanocobalamin   500 mcg Oral Daily    enoxaparin  (LOVENOX ) injection  30 mg Subcutaneous Q24H   ferrous gluconate   324 mg Oral Q breakfast   [START ON 06/17/2023] metolazone   2.5 mg Oral Weekly   pantoprazole   40 mg Oral QAC breakfast   potassium chloride   20 mEq Oral BID   And   potassium chloride   20 mEq Oral Every Saturday   rosuvastatin   40 mg Oral Daily   torsemide   100 mg Oral BID   Continuous Infusions:  vancomycin        LOS: 1 day      Anthony CHRISTELLA Pouch, MD [facility] Pager [phone]  If 7PM-7AM, please contact night-coverage [email or web address] 06/14/2023, 8:23 AM

## 2023-06-14 NOTE — Plan of Care (Signed)
  Problem: Education: Goal: Knowledge of General Education information will improve Description: Including pain rating scale, medication(s)/side effects and non-pharmacologic comfort measures Outcome: Progressing   Problem: Health Behavior/Discharge Planning: Goal: Ability to manage health-related needs will improve Outcome: Progressing   Problem: Clinical Measurements: Goal: Ability to maintain clinical measurements within normal limits will improve Outcome: Progressing Goal: Will remain free from infection Outcome: Progressing Goal: Diagnostic test results will improve Outcome: Progressing Goal: Respiratory complications will improve Outcome: Progressing Goal: Cardiovascular complication will be avoided Outcome: Progressing   Problem: Activity: Goal: Risk for activity intolerance will decrease Outcome: Progressing   Problem: Nutrition: Goal: Adequate nutrition will be maintained Outcome: Progressing   Problem: Coping: Goal: Level of anxiety will decrease Outcome: Progressing   Problem: Elimination: Goal: Will not experience complications related to bowel motility Outcome: Progressing Goal: Will not experience complications related to urinary retention Outcome: Progressing   Problem: Pain Managment: Goal: General experience of comfort will improve and/or be controlled Outcome: Progressing   Problem: Safety: Goal: Ability to remain free from injury will improve Outcome: Progressing   Problem: Skin Integrity: Goal: Risk for impaired skin integrity will decrease Outcome: Progressing   Problem: Clinical Measurements: Goal: Ability to avoid or minimize complications of infection will improve Outcome: Progressing   Problem: Skin Integrity: Goal: Skin integrity will improve Outcome: Progressing

## 2023-06-14 NOTE — Assessment & Plan Note (Signed)
-   The patient's most recent 2D echo on 05/28/2023 revealed an EF of 65 to 70% and diastolic function was indeterminant then. - Will be diuresed with IV Lasix . - We will resume Zaroxolyn . - We will continue potassium supplementation.

## 2023-06-14 NOTE — Assessment & Plan Note (Signed)
 -  We will continue PPI therapy

## 2023-06-14 NOTE — Assessment & Plan Note (Signed)
-   The patient will be admitted to a medical bed. - Will continue antibiotic therapy with IV Rocephin  and vancomycin . - Wound care consult to be obtained. - The patient had negative bilateral lower extremity venous Doppler for DVT. - Wound care consult to be obtained. - Wound Gram stain culture and sensitivity be obtained. - We will follow blood cultures.

## 2023-06-14 NOTE — Assessment & Plan Note (Signed)
 -  We will continue statin therapy.

## 2023-06-14 NOTE — Progress Notes (Signed)
 Pharmacy Antibiotic Note  [name] is a 89 y.o. female admitted on 06/13/2023 with cellulitis.  Pharmacy has been consulted for Vancomycin  dosing.  Plan: Pt given Vancomycin  1500 mg once. Vancomycin  500 mg IV Q 24 hrs. Goal AUC 400-550. Expected AUC: 521.8 SCr used: 1.86  Pharmacy will continue to follow and will adjust abx dosing whenever warranted.  Temp (24hrs), Avg:97.9 F (36.6 C), Min:97.8 F (36.6 C), Max:98 F (36.7 C)   Recent Labs  Lab 06/09/23 1456 06/13/23 1552  WBC 5.6 5.3  CREATININE 1.69* 1.86*    Estimated Creatinine Clearance: 19.9 mL/min (A) (by C-G formula based on SCr of 1.86 mg/dL (H)).    Allergies  Allergen Reactions   Codeine Nausea And Vomiting and Other (See Comments)    Hallucinations, too- CANNOT HAVE; thinks she may have received Narcan  to reverse the effect   Dilaudid  [Hydromorphone ] Other (See Comments)    Excessive Somnolence- Required Narcan  X2   Tape Other (See Comments)    CAN TOLERATE ONLY EASY-RELEASE, PAPER TAPE, AS THE SKIN IS VERY THIN AND WILL BRUISE AND TEAR VERY EASILY   Amoxil  [Amoxicillin ] Nausea Only   Asa [Aspirin ] Other (See Comments)    Told to avoid due to reduced kidney function, has 1 remaining kidney   Ms Contin  [Morphine ] Nausea And Vomiting and Other (See Comments)    Hallucinations, also   Neurontin  [Gabapentin ] Other (See Comments)    Out of it   Nsaids Other (See Comments)    Told to avoid due to reduced kidney function, has 1 remaining kidney    Antimicrobials this admission: 2/8 Vancomycin  >>   Microbiology results: 2/07 WoundCx: Sent  Thank you for allowing pharmacy to be a part of this patient's care.  Rankin CANDIE Dills, PharmD, [facility] 06/14/2023 4:03 AM

## 2023-06-15 DIAGNOSIS — L03116 Cellulitis of left lower limb: Secondary | ICD-10-CM | POA: Diagnosis not present

## 2023-06-15 DIAGNOSIS — C44709 Unspecified malignant neoplasm of skin of left lower limb, including hip: Secondary | ICD-10-CM | POA: Diagnosis not present

## 2023-06-15 LAB — CBC
HCT: 26 % — ABNORMAL LOW (ref 36.0–46.0)
Hemoglobin: 7.4 g/dL — ABNORMAL LOW (ref 12.0–15.0)
MCH: 23.9 pg — ABNORMAL LOW (ref 26.0–34.0)
MCHC: 28.5 g/dL — ABNORMAL LOW (ref 30.0–36.0)
MCV: 84.1 fL (ref 80.0–100.0)
Platelets: 237 10*3/uL (ref 150–400)
RBC: 3.09 MIL/uL — ABNORMAL LOW (ref 3.87–5.11)
RDW: 21.4 % — ABNORMAL HIGH (ref 11.5–15.5)
WBC: 4.3 10*3/uL (ref 4.0–10.5)
nRBC: 0 % (ref 0.0–0.2)

## 2023-06-15 LAB — CREATININE, SERUM
Creatinine, Ser: 1.6 mg/dL — ABNORMAL HIGH (ref 0.44–1.00)
GFR, Estimated: 31 mL/min — ABNORMAL LOW (ref >60)

## 2023-06-15 NOTE — TOC CM/SW Note (Signed)
 Patient has a high readmission risk score. CSW went to bedside for assessment, however patient was sleeping. TOC will follow up later.  Elysha Daw, LCSW Transitions of Care Department [phone]

## 2023-06-15 NOTE — Consult Note (Signed)
 WOC Nurse Consult Note: Reason for Consult: LEs; she was last seen by WOC nursing in 05/28/23 at which time it was noted she would not wear compression Wound type: venous stasis; open dry wounds on the LLE and RLE; L>r, dry, ruddy Pressure Injury POA: NA Measurement:see nursing flow sheets Wound bed: NA Drainage (amount, consistency, odor) serous weeping; venous statis dermatitis Periwound:intact  Dressing procedure/placement/frequency: Cleanse bilateral LEs with soap and water, pat dry Apply Eucerin to LEs but not between toes Cover open wounds with single layer of xeroform, top with Kerlix and wrap from base of toes to the knees  Change every other day Elevate legs as much as possible.   Consider ABIs, she does not have any that I can find on the chart Consider referral to wound care center for decline in LE status, needs long term management of LE wounds     Re consult if needed, will not follow at this time. Thanks  Darrell [employer]auk [employer], RN,CWOCN, CNS, CWON-AP [phone])

## 2023-06-15 NOTE — Progress Notes (Signed)
 PROGRESS NOTE    [name]  FMW:[id number] DOB: 01/19/1934 DOA: 06/13/2023 PCP: [name], MD  Assessment & Plan:   Active Problems:   Cellulitis of left lower extremity   Acute on chronic diastolic CHF (congestive heart failure) (HCC)   Dyslipidemia   GERD without esophagitis  Assessment and Plan: Cellulitis of left lower extremity: continue on IV rocephin , vanco. Wound care consulted. US  of b/l LE neg for DVT. Will consult ID tomorrow as they are not available over the weekends. Hx of skin cancer (unknown type as per pt, possible squamous cell carcinoma as per H&P) of left foot that is managed by derm outpatient. Skin cancer has likely reoccurred & pt may need a repeat biopsy outpatient   Acute on chronic diastolic CHF: echo on 05/28/2023 revealed an EF of 65 to 70% and diastolic function was indeterminant then. Continue on IV lasix . Monitor I/Os. Holding home dose of torsemide    Chronic hypoxic respiratory failure: continue on supplemental oxygen , uses 2L [place] chronically    HLD: continue on statin    GERD: continue on PPI     DVT prophylaxis: lovenox   Code Status: DNR Family Communication:  Disposition Plan: depends on PT/OT recs (not consulted yet)   Level of care: Med-Surg  Status is: Inpatient Remains inpatient appropriate because: severity of illness    Consultants:    Procedures:   Antimicrobials: rocephin , vanco   Subjective: Pt c/o left foot pain   Objective: Vitals:   06/14/23 1522 06/14/23 2106 06/15/23 0503 06/15/23 0842  BP: 94/81 (!) 133/50 128/81 125/61  Pulse: 68 65 63 60  Resp: 18 18 16 18   Temp: 98.2 F (36.8 C) 98.4 F (36.9 C) 97.9 F (36.6 C) 98.8 F (37.1 C)  TempSrc:  Oral Oral Oral  SpO2: 95% 100% 97% 100%  Weight:      Height:        Intake/Output Summary (Last 24 hours) at 06/15/2023 1326 Last data filed at 06/15/2023 1016 Gross per 24 hour  Intake 340 ml  Output --  Net 340 ml   Filed Weights   06/14/23  0134  Weight: 63.5 kg    Examination:  General exam: Appears comfortable  Respiratory system: clear breath sounds b/l  Cardiovascular system: S1/S2+. No rubs or gallops  Gastrointestinal system: abd is soft, NT, ND & hypoactive bowel sounds  Central nervous system: Alert & oriented. Moves all extremities  Psychiatry: Judgement and insight appears normal. Flat mood and affect     Data Reviewed: I have personally reviewed following labs and imaging studies  CBC: Recent Labs  Lab 06/09/23 1456 06/13/23 1552 06/14/23 0618 06/15/23 0907  WBC 5.6 5.3 5.0 4.3  HGB 8.1* 7.8* 7.2* 7.4*  HCT 28.9* 27.1* 24.4* 26.0*  MCV 84 85.8 82.4 84.1  PLT 256 243 233 237   Basic Metabolic Panel: Recent Labs  Lab 06/09/23 1456 06/13/23 1552 06/14/23 0618 06/15/23 0609  NA 137 134* 136  --   K 4.6 4.0 3.5  --   CL 96 93* 97*  --   CO2 28 29 27   --   GLUCOSE 104* 99 125*  --   BUN 48* 58* 62*  --   CREATININE 1.69* 1.86* 1.66* 1.60*  CALCIUM  9.0 8.5* 8.3*  --    GFR: Estimated Creatinine Clearance: 23.2 mL/min (A) (by C-G formula based on SCr of 1.6 mg/dL (H)). Liver Function Tests: No results for input(s): AST, ALT, ALKPHOS, BILITOT, PROT, ALBUMIN  in the last 168  hours. No results for input(s): LIPASE, AMYLASE in the last 168 hours. No results for input(s): AMMONIA in the last 168 hours. Coagulation Profile: No results for input(s): INR, PROTIME in the last 168 hours. Cardiac Enzymes: No results for input(s): CKTOTAL, CKMB, CKMBINDEX, TROPONINI in the last 168 hours. BNP (last 3 results) No results for input(s): PROBNP in the last 8760 hours. HbA1C: No results for input(s): HGBA1C in the last 72 hours. CBG: No results for input(s): GLUCAP in the last 168 hours. Lipid Profile: No results for input(s): CHOL, HDL, LDLCALC, TRIG, CHOLHDL, LDLDIRECT in the last 72 hours. Thyroid  Function Tests: No results for input(s): TSH,  T4TOTAL, FREET4, T3FREE, THYROIDAB in the last 72 hours. Anemia Panel: No results for input(s): VITAMINB12, FOLATE, FERRITIN, TIBC, IRON , RETICCTPCT in the last 72 hours. Sepsis Labs: No results for input(s): PROCALCITON, LATICACIDVEN in the last 168 hours.  Recent Results (from the past 240 hours)  Aerobic/Anaerobic Culture w Gram Stain (surgical/deep wound)     Status: None (Preliminary result)   Collection Time: 06/14/23  8:55 AM   Specimen: Wound  Result Value Ref Range Status   Specimen Description   Final    WOUND Performed at [facility], [address], [place], [place] [address]    Special Requests   Final    LL Performed at [facility], [address] [place], [place] [address]    Gram Stain   Final    RARE WBC SEEN RARE VONNE POSITIVE COCCI RARE GRAM NEGATIVE RODS    Culture   Final    TOO YOUNG TO READ Performed at [facility], [address], [place], [place] [address]    Report Status PENDING  Incomplete         Radiology Studies: US  Venous Img Lower Bilateral Result Date: 06/13/2023 CLINICAL DATA:  Bilateral lower extremity swelling. EXAM: BILATERAL LOWER EXTREMITY VENOUS DOPPLER ULTRASOUND TECHNIQUE: Gray-scale sonography with graded compression, as well as color Doppler and duplex ultrasound were performed to evaluate the lower extremity deep venous systems from the level of the common femoral vein and including the common femoral, femoral, profunda femoral, popliteal and calf veins including the posterior tibial, peroneal and gastrocnemius veins when visible. The superficial great saphenous vein was also interrogated. Spectral Doppler was utilized to evaluate flow at rest and with distal augmentation maneuvers in the common femoral, femoral and popliteal veins. COMPARISON:  None Available. FINDINGS: RIGHT LOWER EXTREMITY Common Femoral Vein: No evidence of thrombus. Normal compressibility, respiratory phasicity  and response to augmentation. Saphenofemoral Junction: No evidence of thrombus. Normal compressibility and flow on color Doppler imaging. Profunda Femoral Vein: No evidence of thrombus. Normal compressibility and flow on color Doppler imaging. Femoral Vein: No evidence of thrombus. Normal compressibility, respiratory phasicity and response to augmentation. Popliteal Vein: No evidence of thrombus. Normal compressibility, respiratory phasicity and response to augmentation. Calf Veins: The RIGHT posterior tibial vein and RIGHT peroneal vein are poorly visualized secondary to right lower extremity edema and ulcers. Superficial Great Saphenous Vein: No evidence of thrombus. Normal compressibility. Venous Reflux:  None. Other Findings:  None. LEFT LOWER EXTREMITY Common Femoral Vein: No evidence of thrombus. Normal compressibility, respiratory phasicity and response to augmentation. Saphenofemoral Junction: No evidence of thrombus. Normal compressibility and flow on color Doppler imaging. Profunda Femoral Vein: No evidence of thrombus. Normal compressibility and flow on color Doppler imaging. Femoral Vein: No evidence of thrombus. Normal compressibility, respiratory phasicity and response to augmentation. Popliteal Vein: No evidence of  thrombus. Normal compressibility, respiratory phasicity and response to augmentation. Calf Veins: The LEFT posterior tibial vein and LEFT peroneal vein are poorly visualized secondary to left lower extremity edema and ulcers. Superficial Great Saphenous Vein: No evidence of thrombus. Normal compressibility. Venous Reflux:  None. Other Findings:  None. IMPRESSION: Limited evaluation of the BILATERAL posterior tibial veins and BILATERAL peroneal veins, without evidence of deep venous thrombosis in either lower extremity. Electronically Signed   By: Suzen Dials M.D.   On: 06/13/2023 19:44   DG Chest 2 View Result Date: 06/13/2023 CLINICAL DATA:  Worsening chest pain and shortness of  breath. History of pancreatic cancer. EXAM: CHEST - 2 VIEW COMPARISON:  05/27/2023 FINDINGS: The cardio pericardial silhouette is enlarged. Bilateral pulmonary nodules again noted which appear calcified consistent with granulomata. Small bilateral pleural effusions. Left-sided permanent pacemaker/AICD again noted. Bones are diffusely demineralized. IMPRESSION: 1. No substantial change. Cardiomegaly with small pleural effusions. Electronically Signed   By: Camellia Candle M.D.   On: 06/13/2023 17:01        Scheduled Meds:  ascorbic acid   500 mg Oral Daily   cyanocobalamin   500 mcg Oral Daily   enoxaparin  (LOVENOX ) injection  30 mg Subcutaneous Q24H   ferrous gluconate   324 mg Oral Q breakfast   furosemide   40 mg Intravenous Daily   [START ON 06/17/2023] metolazone   2.5 mg Oral Weekly   pantoprazole   40 mg Oral QAC breakfast   potassium chloride   20 mEq Oral BID   And   potassium chloride   20 mEq Oral Every Saturday   rosuvastatin   40 mg Oral Daily   Continuous Infusions:  cefTRIAXone  (ROCEPHIN )  IV 2 g (06/15/23 0933)   vancomycin  500 mg (06/14/23 2020)     LOS: 2 days      Anthony CHRISTELLA Pouch, MD [facility] Pager [phone]  If 7PM-7AM, please contact night-coverage [email or web address] 06/15/2023, 1:26 PM

## 2023-06-16 ENCOUNTER — Inpatient Hospital Stay: Payer: PPO

## 2023-06-16 DIAGNOSIS — I5033 Acute on chronic diastolic (congestive) heart failure: Secondary | ICD-10-CM

## 2023-06-16 DIAGNOSIS — C4492 Squamous cell carcinoma of skin, unspecified: Secondary | ICD-10-CM

## 2023-06-16 DIAGNOSIS — N189 Chronic kidney disease, unspecified: Secondary | ICD-10-CM | POA: Diagnosis not present

## 2023-06-16 DIAGNOSIS — D3A8 Other benign neuroendocrine tumors: Secondary | ICD-10-CM

## 2023-06-16 DIAGNOSIS — Z905 Acquired absence of kidney: Secondary | ICD-10-CM | POA: Diagnosis not present

## 2023-06-16 DIAGNOSIS — C44709 Unspecified malignant neoplasm of skin of left lower limb, including hip: Secondary | ICD-10-CM | POA: Diagnosis not present

## 2023-06-16 DIAGNOSIS — L03116 Cellulitis of left lower limb: Secondary | ICD-10-CM | POA: Diagnosis not present

## 2023-06-16 LAB — BASIC METABOLIC PANEL
Anion gap: 11 (ref 5–15)
BUN: 52 mg/dL — ABNORMAL HIGH (ref 8–23)
CO2: 29 mmol/L (ref 22–32)
Calcium: 8.9 mg/dL (ref 8.9–10.3)
Chloride: 98 mmol/L (ref 98–111)
Creatinine, Ser: 1.52 mg/dL — ABNORMAL HIGH (ref 0.44–1.00)
GFR, Estimated: 33 mL/min — ABNORMAL LOW (ref >60)
Glucose, Bld: 102 mg/dL — ABNORMAL HIGH (ref 70–99)
Potassium: 4.9 mmol/L (ref 3.5–5.1)
Sodium: 138 mmol/L (ref 135–145)

## 2023-06-16 LAB — CBC
HCT: 27 % — ABNORMAL LOW (ref 36.0–46.0)
Hemoglobin: 7.8 g/dL — ABNORMAL LOW (ref 12.0–15.0)
MCH: 24.5 pg — ABNORMAL LOW (ref 26.0–34.0)
MCHC: 28.9 g/dL — ABNORMAL LOW (ref 30.0–36.0)
MCV: 84.9 fL (ref 80.0–100.0)
Platelets: 244 10*3/uL (ref 150–400)
RBC: 3.18 MIL/uL — ABNORMAL LOW (ref 3.87–5.11)
RDW: 21.2 % — ABNORMAL HIGH (ref 11.5–15.5)
WBC: 4.4 10*3/uL (ref 4.0–10.5)
nRBC: 0 % (ref 0.0–0.2)

## 2023-06-16 MED ORDER — IPRATROPIUM-ALBUTEROL 0.5-2.5 (3) MG/3ML IN SOLN: 3.0000 mL | Freq: Four times a day (QID) | RESPIRATORY_TRACT | Status: DC | PRN

## 2023-06-16 MED ORDER — FUROSEMIDE 10 MG/ML IJ SOLN
40.0000 mg | Freq: Two times a day (BID) | INTRAMUSCULAR | Status: DC
  Administered 2023-06-16 – 2023-06-20 (×7): 40 mg via INTRAVENOUS
  Filled 2023-06-16 (×8): qty 4

## 2023-06-16 MED ORDER — LINEZOLID 600 MG PO TABS
600.0000 mg | ORAL_TABLET | Freq: Two times a day (BID) | ORAL | Status: DC
Start: 2023-06-16 — End: 2023-06-19
  Administered 2023-06-16 – 2023-06-19 (×6): 600 mg via ORAL
  Filled 2023-06-16 (×6): qty 1

## 2023-06-16 NOTE — Progress Notes (Signed)
 PROGRESS NOTE    [name]  OZH:[id number] DOB: 1934-03-07 DOA: 06/13/2023 PCP: Claire Crick, MD  Assessment & Plan:   Active Problems:   Cellulitis of left lower extremity   Acute on chronic diastolic CHF (congestive heart failure) (HCC)   Dyslipidemia   GERD without esophagitis  Assessment and Plan: Cellulitis of left lower extremity: continue on IV rocephin , vanco. Continue w/ wound care. US  of b/l LE neg for DVT. ID consulted. Hx of skin cancer (unknown type as per pt, possible squamous cell carcinoma as per H&P) of left foot that is managed by derm outpatient. Skin cancer has likely reoccurred & pt may need a repeat biopsy outpatient   Acute on chronic diastolic CHF: echo on 05/28/2023 revealed an EF of 65 to 70% and diastolic function was indeterminant then. C/o shortness of breath today. Repeat CXR ordered. Continue on IV lasix . Monitor I/Os   Chronic hypoxic respiratory failure: continue on supplemental oxygen , chronically on 2L [place]    HLD: continue on statin    GERD: continue on PPI   Pancreatic cancer: neuroendocrine dx in 2023. Management per onco outpatient   DVT prophylaxis: lovenox   Code Status: DNR Family Communication:  Disposition Plan: depends on PT/OT recs   Level of care: Med-Surg  Status is: Inpatient Remains inpatient appropriate because: severity of illness    Consultants:  ID  Procedures:   Antimicrobials: rocephin , vanco   Subjective: Pt c/o shortness of breath.  Objective: Vitals:   06/15/23 1943 06/16/23 0316 06/16/23 0807 06/16/23 0955  BP: 135/73 120/62 (!) 147/47 (!) 142/60  Pulse: 63 62 (!) 58 63  Resp: 20 20 (!) 21 (!) 21  Temp: 98 F (36.7 C) 97.8 F (36.6 C) 97.6 F (36.4 C)   TempSrc: Oral Oral Oral   SpO2: 100% 100% 100%   Weight:      Height:        Intake/Output Summary (Last 24 hours) at 06/16/2023 1455 Last data filed at 06/16/2023 0347 Gross per 24 hour  Intake --  Output 250 ml  Net -250 ml    Filed Weights   06/14/23 0134  Weight: 63.5 kg    Examination:  General exam: Appears uncomfortable  Respiratory system: decreased breath sounds b/l Cardiovascular system: S1 & S2+. No rubs or gallops  Gastrointestinal system: abd is soft, NT, ND & hypoactive bowel sounds  Central nervous system: alert & oriented. Moves all extremities  Psychiatry: judgement and insight appears at baseline. Flat mood and affect    Data Reviewed: I have personally reviewed following labs and imaging studies  CBC: Recent Labs  Lab 06/09/23 1456 06/13/23 1552 06/14/23 0618 06/15/23 0907 06/16/23 0509  WBC 5.6 5.3 5.0 4.3 4.4  HGB 8.1* 7.8* 7.2* 7.4* 7.8*  HCT 28.9* 27.1* 24.4* 26.0* 27.0*  MCV 84 85.8 82.4 84.1 84.9  PLT 256 243 233 237 244   Basic Metabolic Panel: Recent Labs  Lab 06/09/23 1456 06/13/23 1552 06/14/23 0618 06/15/23 0609 06/16/23 0509  NA 137 134* 136  --  138  K 4.6 4.0 3.5  --  4.9  CL 96 93* 97*  --  98  CO2 28 29 27   --  29  GLUCOSE 104* 99 125*  --  102*  BUN 48* 58* 62*  --  52*  CREATININE 1.69* 1.86* 1.66* 1.60* 1.52*  CALCIUM  9.0 8.5* 8.3*  --  8.9   GFR: Estimated Creatinine Clearance: 24.4 mL/min (A) (by C-G formula based on SCr of 1.52  mg/dL (H)). Liver Function Tests: No results for input(s): "AST", "ALT", "ALKPHOS", "BILITOT", "PROT", "ALBUMIN " in the last 168 hours. No results for input(s): "LIPASE", "AMYLASE" in the last 168 hours. No results for input(s): "AMMONIA" in the last 168 hours. Coagulation Profile: No results for input(s): "INR", "PROTIME" in the last 168 hours. Cardiac Enzymes: No results for input(s): "CKTOTAL", "CKMB", "CKMBINDEX", "TROPONINI" in the last 168 hours. BNP (last 3 results) No results for input(s): "PROBNP" in the last 8760 hours. HbA1C: No results for input(s): "HGBA1C" in the last 72 hours. CBG: No results for input(s): "GLUCAP" in the last 168 hours. Lipid Profile: No results for input(s): "CHOL", "HDL",  "LDLCALC", "TRIG", "CHOLHDL", "LDLDIRECT" in the last 72 hours. Thyroid  Function Tests: No results for input(s): "TSH", "T4TOTAL", "FREET4", "T3FREE", "THYROIDAB" in the last 72 hours. Anemia Panel: No results for input(s): "VITAMINB12", "FOLATE", "FERRITIN", "TIBC", "IRON ", "RETICCTPCT" in the last 72 hours. Sepsis Labs: No results for input(s): "PROCALCITON", "LATICACIDVEN" in the last 168 hours.  Recent Results (from the past 240 hours)  Aerobic/Anaerobic Culture w Gram Stain (surgical/deep wound)     Status: None (Preliminary result)   Collection Time: 06/14/23  8:55 AM   Specimen: Wound  Result Value Ref Range Status   Specimen Description   Final    WOUND Performed at [facility], [address] [place], [place] [address]    Special Requests   Final    LL Performed at [facility], [address] [place], [place] [address]    Gram Stain   Final    RARE WBC SEEN RARE Hillis Lu POSITIVE COCCI RARE GRAM NEGATIVE RODS Performed at [facility], [address], [place], [place] [address]    Culture   Final    CULTURE REINCUBATED FOR BETTER GROWTH NO ANAEROBES ISOLATED; CULTURE IN PROGRESS FOR 5 DAYS    Report Status PENDING  Incomplete         Radiology Studies: No results found.       Scheduled Meds:  ascorbic acid   500 mg Oral Daily   cyanocobalamin   500 mcg Oral Daily   enoxaparin  (LOVENOX ) injection  30 mg Subcutaneous Q24H   ferrous gluconate   324 mg Oral Q breakfast   furosemide   40 mg Intravenous Daily   [START ON 06/17/2023] metolazone   2.5 mg Oral Weekly   pantoprazole   40 mg Oral QAC breakfast   potassium chloride   20 mEq Oral BID   And   potassium chloride   20 mEq Oral Every Saturday   rosuvastatin   40 mg Oral Daily   Continuous Infusions:  cefTRIAXone  (ROCEPHIN )  IV 2 g (06/16/23 0943)   vancomycin  500 mg (06/15/23 2038)     LOS: 3 days      Alphonsus Jeans, MD [facility] Pager [phone]  If  7PM-7AM, please contact night-coverage [email or web address] 06/16/2023, 2:55 PM

## 2023-06-16 NOTE — Plan of Care (Signed)
   Problem: Education: Goal: Knowledge of General Education information will improve Description: Including pain rating scale, medication(s)/side effects and non-pharmacologic comfort measures Outcome: Progressing   Problem: Health Behavior/Discharge Planning: Goal: Ability to manage health-related needs will improve Outcome: Progressing   Problem: Clinical Measurements: Goal: Ability to maintain clinical measurements within normal limits will improve Outcome: Progressing Goal: Will remain free from infection Outcome: Progressing Goal: Diagnostic test results will improve Outcome: Progressing Goal: Respiratory complications will improve Outcome: Progressing Goal: Cardiovascular complication will be avoided Outcome: Progressing   Problem: Activity: Goal: Risk for activity intolerance will decrease Outcome: Progressing   Problem: Nutrition: Goal: Adequate nutrition will be maintained Outcome: Progressing   Problem: Coping: Goal: Level of anxiety will decrease Outcome: Progressing   Problem: Elimination: Goal: Will not experience complications related to bowel motility Outcome: Progressing Goal: Will not experience complications related to urinary retention Outcome: Progressing   Problem: Pain Managment: Goal: General experience of comfort will improve and/or be controlled Outcome: Progressing   Problem: Safety: Goal: Ability to remain free from injury will improve Outcome: Progressing   Problem: Skin Integrity: Goal: Risk for impaired skin integrity will decrease Outcome: Progressing   Problem: Clinical Measurements: Goal: Ability to avoid or minimize complications of infection will improve Outcome: Progressing   Problem: Skin Integrity: Goal: Skin integrity will improve Outcome: Progressing

## 2023-06-16 NOTE — Progress Notes (Signed)
 Wound care order states to be changed at 0500 however when addressed with patient she reported she was told wound nurse would be by to see wound this am and wanted to wait until later to see so she would not have to have it changed multiple times.

## 2023-06-16 NOTE — Consult Note (Signed)
 NAME: [name]  DOB: 14-Oct-1933  MRN: [id number]  Date/Time: 06/16/2023 11:29 AM  REQUESTING PROVIDER: Dr.Williams Subjective:  REASON FOR CONSULT: lower extremity wounds /cellulitis ? [name] is a 89 y.o. 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  She was asked to continue torsemide  100 mg twice daily and metolazone  2.5 mg weekly, she recently was started on Keflex  500 mg twice daily for the left leg wound which was worsening and she wanted her to go to the ED.  Patient came to the ED In the afternoon.  Vitals in the ED   06/13/23  BP 121/96 (H)  Temp 98 F (36.7 C)  Pulse Rate 60  Resp 19  SpO2 100 %  O2 Flow Rate (L/min) 2 L/min    Latest Reference Range & Units 06/13/23  WBC 4.0 - 10.5 K/uL 5.3  Hemoglobin 12.0 - 15.0 g/dL 7.8 (L)  HCT 95.6 - 21.3 % 27.1 (L)  Platelets 150 - 400 K/uL 243  Creatinine 0.44 - 1.00 mg/dL 0.86 (H)   No blood culture was sent.  Wound culture was sent and she was started on ceftriaxone  and vancomycin .  I am asked to see the patient for management of the cellulitis/wound. Pt has h/o scc . She says there was a lesion on the left foot which has been present for a long time and she is followed by a dermatologist  Past Medical History:  Diagnosis Date   (HFpEF) heart failure with preserved ejection fraction (HCC)    EF=60-60%   Actinic keratosis 01/17/2015   R forearm   Adjustment disorder with anxiety    Adverse effect of  other narcotics, sequela    Intolerance to all narcotics   Anti-Duffy antibodies present    Aortic valve stenosis    Arthritis    "some in my hands" (11/11/2012)   Atrial fibrillation, permanent (HCC)    Eliquis    Atypical mole 03/25/2018   L forearm - severe   Automatic implantable cardioverter-defibrillator in situ    Avascular necrosis of hip (HCC) 05/03/2011   Carotid artery stenosis 09/2007   a. 09/2007: 60-79% bilateral (stable); b. 10/2008: 40-59% R 60-79%    Cellulitis of left lower extremity 07/13/2020   CKD (chronic kidney disease), stage III (HCC)    COPD (chronic obstructive pulmonary disease) (HCC)    Coronary artery disease    non-obstructive by 2006 cath   COVID-19 virus infection 02/12/2022   Displaced fracture of left femoral neck (HCC) 10/09/2018   GI bleed 03/28/2020   AVM   High cholesterol    Hypertension 05/20/2011   Hypertr obst cardiomyop    Hypotension, unspecified    cardiac cath 2006..nonobstructive CAD 30-40s lesions.Aaron AasETT 1/09 nondiagnostic due to poor HR response..Right Renal Cancer 2003   Iron  deficiency anemia    Long term (current) use of anticoagulants    On home oxygen  therapy    3L [place]   Osteoarthritis of right hip    PAD (peripheral artery disease) (HCC)    Pancreatic  cancer (HCC)    sandostatin    PONV (postoperative nausea and vomiting)    Presence of permanent cardiac pacemaker    Pulmonary HTN (HCC)    RECTAL BLEEDING 10/13/2009   Qualifier: Diagnosis of  By: Daphane Dynes NP, Paula     Red blood cell antibody positive with compatible PRBC difficult to obtain    Anti FYA (Duffy a) antibody. Must be transfused with PRB which are Duffy Antigen Negative and Crossmatch Compatible   Renal cell carcinoma (HCC)    s/p nephrectomy   Squamous cell carcinoma of skin 01/17/2015   R lat wrist   Squamous cell carcinoma of skin 01/29/2018   R post upper leg - superficially invasive   Squamous cell carcinoma of skin 03/25/2018   L lat foot   Squamous  cell carcinoma of skin 11/09/2020   left lat foot - EDC 01/01/21, recurrent 01/31/21 - MOHs 02/22/21   Squamous cell carcinoma of skin 12/19/2021   Right Posterior Medial Thigh, EDC   Squamous cell carcinoma of skin 12/19/2021   SCC IS, L lat heel, EDC 02/04/2022   Squamous cell carcinoma of skin 01/21/2022   L forearm, EDC 02/04/2022   Squamous cell carcinoma of skin 01/21/2022   R lower leg below knee, EDC 02/04/2022   Squamous cell carcinoma of skin 01/21/2022   SCCIS, R post heel, EDC 02/04/2022   Squamous cell carcinoma of skin 07/01/2022   left lower abdomen, in situ, EDC   Squamous cell carcinoma of skin 07/01/2022   left medial chest, EDC   Urge incontinence    Venous stasis of both lower extremities     Past Surgical History:  Procedure Laterality Date   ABDOMINAL AORTOGRAM W/LOWER EXTREMITY N/A 02/12/2021   Procedure: ABDOMINAL AORTOGRAM W/LOWER EXTREMITY;  Surgeon: Adine Hoof, MD;  Location: [facility] INVASIVE CV LAB;  Service: Cardiovascular;  Laterality: N/A;   ABDOMINAL HYSTERECTOMY  1975   for benign causes   APPENDECTOMY     BI-VENTRICULAR PACEMAKER UPGRADE  05/04/2010   BIOPSY  02/28/2019   Procedure: BIOPSY;  Surgeon: Janel Medford, MD;  Location: [facility] ENDOSCOPY;  Service: Endoscopy;;   BIOPSY  03/04/2022   Procedure: BIOPSY;  Surgeon: Normie Becton., MD;  Location: [facility] ENDOSCOPY;  Service: Gastroenterology;;   CARDIAC CATHETERIZATION  2006   CARDIOVERSION N/A 02/20/2018   Procedure: CARDIOVERSION;  Surgeon: Devorah Fonder, MD;  Location: [facility] ORS;  Service: Cardiovascular;  Laterality: N/A;   CARDIOVERSION N/A 03/27/2018   Procedure: CARDIOVERSION;  Surgeon: Devorah Fonder, MD;  Location: [facility] ORS;  Service: Cardiovascular;  Laterality: N/A;   CATARACT EXTRACTION W/ INTRAOCULAR LENS  IMPLANT, BILATERAL  01/2006-02-2006   CHOLECYSTECTOMY N/A 11/11/2012   Procedure: LAPAROSCOPIC CHOLECYSTECTOMY WITH INTRAOPERATIVE CHOLANGIOGRAM;  Surgeon:  Kari Otto. Eli Grizzle, MD;  Location: [facility] OR;  Service: General;  Laterality: N/A;   COLONOSCOPY WITH PROPOFOL  N/A 02/28/2019   Procedure: COLONOSCOPY WITH PROPOFOL ;  Surgeon: Janel Medford, MD;  Location: [facility] ENDOSCOPY;  Service: Endoscopy;  Laterality: N/A;   ENTEROSCOPY N/A 03/30/2020   Procedure: ENTEROSCOPY;  Surgeon: Annis Kinder, DO;  Location: [facility] ENDOSCOPY;  Service: Gastroenterology;  Laterality: N/A;   ENTEROSCOPY N/A 02/10/2021   Procedure: ENTEROSCOPY;  Surgeon: Alvis Jourdain, MD;  Location: [facility] ENDOSCOPY;  Service: Endoscopy;  Laterality: N/A;   ENTEROSCOPY N/A 02/15/2022   Procedure: ENTEROSCOPY;  Surgeon: Daina Drum, MD;  Location: [facility] ENDOSCOPY;  Service: Gastroenterology;  Laterality: N/A;   ENTEROSCOPY N/A 11/06/2022   Procedure: ENTEROSCOPY;  Surgeon:  Mansouraty, Albino Alu., MD;  Location: [facility] ENDOSCOPY;  Service: Gastroenterology;  Laterality: N/A;   EP IMPLANTABLE DEVICE N/A 02/21/2016   Procedure: ICD Generator Changeout;  Surgeon: Verona Goodwill, MD;  Location: [facility] INVASIVE CV LAB;  Service: Cardiovascular;  Laterality: N/A;   ESOPHAGOGASTRODUODENOSCOPY N/A 03/04/2022   Procedure: ESOPHAGOGASTRODUODENOSCOPY (EGD);  Surgeon: Normie Becton., MD;  Location: [facility] ENDOSCOPY;  Service: Gastroenterology;  Laterality: N/A;   ESOPHAGOGASTRODUODENOSCOPY (EGD) WITH PROPOFOL  N/A 02/28/2019   Procedure: ESOPHAGOGASTRODUODENOSCOPY (EGD) WITH PROPOFOL ;  Surgeon: Janel Medford, MD;  Location: [facility] ENDOSCOPY;  Service: Endoscopy;  Laterality: N/A;   EUS N/A 03/04/2022   Procedure: UPPER ENDOSCOPIC ULTRASOUND (EUS) RADIAL;  Surgeon: Normie Becton., MD;  Location: [facility] ENDOSCOPY;  Service: Gastroenterology;  Laterality: N/A;   FINE NEEDLE ASPIRATION N/A 03/04/2022   Procedure: FINE NEEDLE ASPIRATION (FNA) LINEAR;  Surgeon: Normie Becton., MD;  Location: [facility] ENDOSCOPY;  Service: Gastroenterology;  Laterality: N/A;   GIVENS CAPSULE STUDY N/A 03/15/2019   Procedure:  GIVENS CAPSULE STUDY;  Surgeon: Lindle Rhea, MD;  Location: [facility] ENDOSCOPY;  Service: Gastroenterology;  Laterality: N/A;   HOT HEMOSTASIS N/A 03/30/2020   Procedure: HOT HEMOSTASIS (ARGON PLASMA COAGULATION/BICAP);  Surgeon: Annis Kinder, DO;  Location: [facility] ENDOSCOPY;  Service: Gastroenterology;  Laterality: N/A;   HOT HEMOSTASIS N/A 02/10/2021   Procedure: HOT HEMOSTASIS (ARGON PLASMA COAGULATION/BICAP);  Surgeon: Alvis Jourdain, MD;  Location: [facility] ENDOSCOPY;  Service: Endoscopy;  Laterality: N/A;   HOT HEMOSTASIS N/A 02/15/2022   Procedure: HOT HEMOSTASIS (ARGON PLASMA COAGULATION/BICAP);  Surgeon: Daina Drum, MD;  Location: [facility] ENDOSCOPY;  Service: Gastroenterology;  Laterality: N/A;   HOT HEMOSTASIS N/A 11/06/2022   Procedure: HOT HEMOSTASIS (ARGON PLASMA COAGULATION/BICAP);  Surgeon: Normie Becton., MD;  Location: [facility] ENDOSCOPY;  Service: Gastroenterology;  Laterality: N/A;   INSERT / REPLACE / REMOVE PACEMAKER  05-01-11   02-28-05-/05-04-10-ICD-MEDTRONIC MAXIMAL DR   IR ANGIOGRAM SELECTIVE EACH ADDITIONAL VESSEL  02/18/2022   IR ANGIOGRAM SELECTIVE EACH ADDITIONAL VESSEL  02/18/2022   IR ANGIOGRAM VISCERAL SELECTIVE  02/16/2022   IR ANGIOGRAM VISCERAL SELECTIVE  02/16/2022   IR EMBO ART  VEN HEMORR LYMPH EXTRAV  INC GUIDE ROADMAPPING  02/16/2022   IR THORACENTESIS ASP PLEURAL SPACE W/IMG GUIDE  02/21/2022   IR THORACENTESIS ASP PLEURAL SPACE W/IMG GUIDE  02/22/2022   IR US  GUIDE VASC ACCESS RIGHT  02/16/2022   JOINT REPLACEMENT     LAPAROSCOPIC CHOLECYSTECTOMY  11/11/2012   LAPAROSCOPIC LYSIS OF ADHESIONS N/A 11/11/2012   Procedure: LAPAROSCOPIC LYSIS OF ADHESIONS;  Surgeon: Kari Otto. Eli Grizzle, MD;  Location: [facility] OR;  Service: General;  Laterality: N/A;   NEPHRECTOMY Right 06/2001    S/P RENAL CELL CANCER   PERIPHERAL VASCULAR INTERVENTION Bilateral 02/12/2021   Procedure: PERIPHERAL VASCULAR INTERVENTION;  Surgeon: Adine Hoof, MD;  Location: [facility] INVASIVE CV LAB;   Service: Cardiovascular;  Laterality: Bilateral;  Iliac artery stents   PRESSURE SENSOR/CARDIOMEMS N/A 02/03/2019   Procedure: PRESSURE SENSOR/CARDIOMEMS;  Surgeon: Darlis Eisenmenger, MD;  Location: [facility] INVASIVE CV LAB;  Service: Cardiovascular;  Laterality: N/A;   RIGHT HEART CATH N/A 11/09/2018   Procedure: RIGHT HEART CATH;  Surgeon: Darlis Eisenmenger, MD;  Location: [facility] INVASIVE CV LAB;  Service: Cardiovascular;  Laterality: N/A;   RIGHT HEART CATH N/A 03/08/2019   Procedure: RIGHT HEART CATH;  Surgeon: Darlis Eisenmenger, MD;  Location: [facility] INVASIVE CV LAB;  Service: Cardiovascular;  Laterality: N/A;   SUBMUCOSAL TATTOO INJECTION  02/28/2019  Procedure: SUBMUCOSAL TATTOO INJECTION;  Surgeon: Janel Medford, MD;  Location: [facility] ENDOSCOPY;  Service: Endoscopy;;   SUBMUCOSAL TATTOO INJECTION  11/06/2022   Procedure: SUBMUCOSAL TATTOO INJECTION;  Surgeon: Normie Becton., MD;  Location: [facility] ENDOSCOPY;  Service: Gastroenterology;;   TOTAL HIP ARTHROPLASTY Right 05/03/2011   Procedure: TOTAL HIP ARTHROPLASTY ANTERIOR APPROACH;  Surgeon: Arnie Lao;  Location: [facility] ORS;  Service: Orthopedics;  Laterality: Right;  Removal of Cannulated Screws Right Hip, Right Direct Anterior Hip Replacement   TOTAL HIP ARTHROPLASTY Left 10/09/2018   Procedure: TOTAL HIP ARTHROPLASTY ANTERIOR APPROACH;  Surgeon: Adonica Hoose, MD;  Location: [facility] OR;  Service: Orthopedics;  Laterality: Left;    Social History   Socioeconomic History   Marital status: Widowed    Spouse name: Not on file   Number of children: 2   Years of education: high school   Highest education level: Not on file  Occupational History   Occupation: Retired    Associate Professor: RETIRED  Tobacco Use   Smoking status: Former    Current packs/day: 0.00    Average packs/day: 0.5 packs/day for 40.0 years (20.0 ttl pk-yrs)    Types: Cigarettes    Start date: 05/06/1961    Quit date: 05/06/2001    Years since quitting: 22.1   Smokeless tobacco: Never   Vaping Use   Vaping status: Never Used  Substance and Sexual Activity   Alcohol use: No    Alcohol/week: 0.0 standard drinks of alcohol   Drug use: No   Sexual activity: Not Currently  Other Topics Concern   Not on file  Social History Narrative   Would desire CPR   07/11/20   From: the area   Living: with great-grandson - Herminia Lope (2003)   Work: retired - clerical      Family: 2 children - Amalia Badder and [place] - 2 grand chlidren - 5 great grand children      Enjoys: spending time with friends - watch move, eat out, spend time      Exercise: walking around the house   Diet: not good, limits red meat, limits salt, low appetite      Safety   Seat belts: Yes    Guns: Yes  and secure   Safe in relationships: Yes    Social Drivers of Health   Financial Resource Strain: Low Risk  (05/28/2022)   Overall Financial Resource Strain (CARDIA)    Difficulty of Paying Living Expenses: Not hard at all  Food Insecurity: No Food Insecurity (06/14/2023)   Hunger Vital Sign    Worried About Running Out of Food in the Last Year: Never true    Ran Out of Food in the Last Year: Never true  Transportation Needs: No Transportation Needs (06/14/2023)   PRAPARE - Administrator, Civil Service (Medical): No    Lack of Transportation (Non-Medical): No  Physical Activity: Inactive (09/24/2021)   Exercise Vital Sign    Days of Exercise per Week: 0 days    Minutes of Exercise per Session: 0 min  Stress: No Stress Concern Present (09/24/2021)   [employer] of Occupational Health - Occupational Stress Questionnaire    Feeling of Stress : Not at all  Social Connections: Socially Isolated (06/14/2023)   Social Connection and Isolation Panel [NHANES]    Frequency of Communication with Friends and Family: More than three times a week    Frequency of Social Gatherings with Friends and Family: More than three times a week  Attends Religious Services: Never    Active Member of Clubs or  Organizations: No    Attends Banker Meetings: Never    Marital Status: Widowed  Intimate Partner Violence: Not At Risk (06/14/2023)   Humiliation, Afraid, Rape, and Kick questionnaire    Fear of Current or Ex-Partner: No    Emotionally Abused: No    Physically Abused: No    Sexually Abused: No    Family History  Problem Relation Age of Onset   Heart failure Mother    Early death Father        car accident   Breast cancer Maternal Aunt 80   Breast cancer Cousin    Breast cancer Other    Colon cancer Neg Hx    Esophageal cancer Neg Hx    Liver disease Neg Hx    Pancreatic cancer Neg Hx    Rectal cancer Neg Hx    Allergies  Allergen Reactions   Codeine Nausea And Vomiting and Other (See Comments)    Hallucinations, too- CANNOT HAVE; thinks she may have received Narcan  to reverse the effect   Dilaudid  [Hydromorphone ] Other (See Comments)    Excessive Somnolence- Required Narcan  X2   Tape Other (See Comments)    CAN TOLERATE ONLY EASY-RELEASE, PAPER TAPE, AS THE SKIN IS VERY THIN AND WILL BRUISE AND TEAR VERY EASILY   Amoxil  [Amoxicillin ] Nausea Only   Asa [Aspirin ] Other (See Comments)    Told to avoid due to reduced kidney function, has 1 remaining kidney   Ms Contin  [Morphine ] Nausea And Vomiting and Other (See Comments)    Hallucinations, also   Neurontin  [Gabapentin ] Other (See Comments)    "Out of it"   Nsaids Other (See Comments)    Told to avoid due to reduced kidney function, has 1 remaining kidney   I? Current Facility-Administered Medications  Medication Dose Route Frequency Provider Last Rate Last Admin   acetaminophen  (TYLENOL ) tablet 650 mg  650 mg Oral Q6H PRN Mansy, Jan A, MD   650 mg at 06/16/23 0941   Or   acetaminophen  (TYLENOL ) suppository 650 mg  650 mg Rectal Q6H PRN Mansy, Jan A, MD       ascorbic acid  (VITAMIN C ) tablet 500 mg  500 mg Oral Daily Mansy, Jan A, MD   500 mg at 06/16/23 0941   cefTRIAXone  (ROCEPHIN ) 2 g in sodium chloride   0.9 % 100 mL IVPB  2 g Intravenous Q24H Alphonsus Jeans, MD 200 mL/hr at 06/16/23 0943 2 g at 06/16/23 0943   cyanocobalamin  (VITAMIN B12) tablet 500 mcg  500 mcg Oral Daily Mansy, Jan A, MD   500 mcg at 06/16/23 0946   enoxaparin  (LOVENOX ) injection 30 mg  30 mg Subcutaneous Q24H Mansy, Jan A, MD   30 mg at 06/15/23 2044   ferrous gluconate  [facility]) tablet 324 mg  324 mg Oral Q breakfast Mansy, Jan A, MD   324 mg at 06/16/23 0755   furosemide  (LASIX ) injection 40 mg  40 mg Intravenous Daily Alphonsus Jeans, MD   40 mg at 06/16/23 0941   ipratropium-albuterol  (DUONEB) 0.5-2.5 (3) MG/3ML nebulizer solution 3 mL  3 mL Nebulization Q6H PRN Alphonsus Jeans, MD       magnesium  hydroxide (MILK OF MAGNESIA) suspension 30 mL  30 mL Oral Daily PRN Mansy, Anastasio Kaska, MD       [START ON 06/17/2023] metolazone  (ZAROXOLYN ) tablet 2.5 mg  2.5 mg Oral Weekly Coretta Dexter, [facility]  ondansetron  (ZOFRAN ) tablet 4 mg  4 mg Oral Q6H PRN Mansy, Jan A, MD       Or   ondansetron  (ZOFRAN ) injection 4 mg  4 mg Intravenous Q6H PRN Mansy, Jan A, MD       pantoprazole  (PROTONIX ) EC tablet 40 mg  40 mg Oral QAC breakfast Mansy, Jan A, MD   40 mg at 06/16/23 0755   potassium chloride  SA (KLOR-CON  M) CR tablet 20 mEq  20 mEq Oral BID Coretta Dexter, RPH   20 mEq at 06/16/23 5409   And   potassium chloride  SA (KLOR-CON  M) CR tablet 20 mEq  20 mEq Oral Every Saturday Coretta Dexter, [place]   20 mEq at 06/14/23 1636   rosuvastatin  (CRESTOR ) tablet 40 mg  40 mg Oral Daily Mansy, Jan A, MD   40 mg at 06/16/23 8119   traZODone  (DESYREL ) tablet 25 mg  25 mg Oral QHS PRN Mansy, Jan A, MD       vancomycin  (VANCOREADY) IVPB 500 mg/100 mL  500 mg Intravenous Q24H Coretta Dexter, RPH 100 mL/hr at 06/15/23 2038 500 mg at 06/15/23 2038     Abtx:  Anti-infectives (From admission, onward)    Start     Dose/Rate Route Frequency Ordered Stop   06/14/23 2000  vancomycin  (VANCOREADY) IVPB 500 mg/100 mL        500 mg 100 mL/hr over  60 Minutes Intravenous Every 24 hours 06/14/23 0409     06/14/23 0930  cefTRIAXone  (ROCEPHIN ) 2 g in sodium chloride  0.9 % 100 mL IVPB        2 g 200 mL/hr over 30 Minutes Intravenous Every 24 hours 06/14/23 0828     06/13/23 2200  ceFAZolin  (ANCEF ) IVPB 2g/100 mL premix  Status:  Discontinued        2 g 200 mL/hr over 30 Minutes Intravenous Every 8 hours 06/13/23 1935 06/13/23 1936   06/13/23 1900  vancomycin  (VANCOREADY) IVPB 1500 mg/300 mL        1,500 mg 150 mL/hr over 120 Minutes Intravenous  Once 06/13/23 1859 06/13/23 2228       REVIEW OF SYSTEMS:  Const: negative fever, negative chills, negative weight loss Eyes: negative diplopia or visual changes, negative eye pain ENT: negative coryza, negative sore throat Resp: has shortness of breath even at rest Cards: negative for chest pain, has palpitations, has lower extremity edema GU: negative for frequency, dysuria and hematuria GI: Negative for abdominal pain, diarrhea, bleeding, constipation Skin: as above Heme: negative for easy bruising and gum/nose bleeding MS: weakness Neurolo:negative for headaches, dizziness, vertigo, memory problems  Psych: negative for feelings of anxiety, depression  Endocrine: negative for thyroid , diabetes Allergy/Immunology- multiple meds noted to have allergy but most are GI intolerance including amoxicillin  : Objective:  VITALS:  BP (!) 142/60   Pulse 63   Temp 97.6 F (36.4 C) (Oral)   Resp (!) 21   Ht 5' 5.98" (1.676 m)   Wt 63.5 kg   SpO2 100%   BMI 22.61 kg/m  LDA Foley Central line Other drainage tubes PHYSICAL EXAM:  General: Alert, cooperative,some resp distresss, appears stated age.  Head: Normocephalic, without obvious abnormality, atraumatic. Eyes: Conjunctivae clear, anicteric sclerae. Pupils are equal ENT Nares normal. No drainage or sinus tenderness. Lips, mucosa, and tongue normal. No Thrush Neck: Supple, symmetrical, no adenopathy, thyroid : non tender no carotid  bruit and no JVD. Back: No CVA tenderness. Lungs: Clear to auscultation bilaterally. No Wheezing or Rhonchi.  No rales. Heart: irregular. Abdomen: Soft, non-tender,not distended. Bowel sounds normal. No masses Extremities: b/l edema of lower extremities  Skin: actinic changes throughout  Lymph: Cervical, supraclavicular normal. Neurologic: Grossly non-focal Pertinent Labs Lab Results CBC    Component Value Date/Time   WBC 4.4 06/16/2023 0509   RBC 3.18 (L) 06/16/2023 0509   HGB 7.8 (L) 06/16/2023 0509   HGB 8.1 (L) 06/09/2023 1456   HCT 27.0 (L) 06/16/2023 0509   HCT 28.9 (L) 06/09/2023 1456   PLT 244 06/16/2023 0509   PLT 256 06/09/2023 1456   MCV 84.9 06/16/2023 0509   MCV 84 06/09/2023 1456   MCH 24.5 (L) 06/16/2023 0509   MCHC 28.9 (L) 06/16/2023 0509   RDW 21.2 (H) 06/16/2023 0509   RDW 19.5 (H) 06/09/2023 1456   LYMPHSABS 0.3 (L) 05/27/2023 1723   LYMPHSABS 0.6 (L) 03/22/2019 1415   MONOABS 0.7 05/27/2023 1723   EOSABS 0.0 05/27/2023 1723   EOSABS 0.2 03/22/2019 1415   BASOSABS 0.0 05/27/2023 1723   BASOSABS 0.0 03/22/2019 1415       Latest Ref Rng & Units 06/16/2023    5:09 AM 06/15/2023    6:09 AM 06/14/2023    6:18 AM  CMP  Glucose 70 - 99 mg/dL 564   332   BUN 8 - 23 mg/dL 52   62   Creatinine 9.51 - 1.00 mg/dL 8.84  1.66  0.63   Sodium 135 - 145 mmol/L 138   136   Potassium 3.5 - 5.1 mmol/L 4.9   3.5   Chloride 98 - 111 mmol/L 98   97   CO2 22 - 32 mmol/L 29   27   Calcium  8.9 - 10.3 mg/dL 8.9   8.3       Microbiology: Recent Results (from the past 240 hours)  Aerobic/Anaerobic Culture w Gram Stain (surgical/deep wound)     Status: None (Preliminary result)   Collection Time: 06/14/23  8:55 AM   Specimen: Wound  Result Value Ref Range Status   Specimen Description   Final    WOUND Performed at [facility], [address] [place], [place] [address]    Special Requests   Final    LL Performed at [facility], [address] [place], [place] [address]    Gram Stain   Final    RARE WBC SEEN RARE Hillis Lu POSITIVE COCCI RARE GRAM NEGATIVE RODS    Culture   Final    TOO YOUNG TO READ Performed at [facility], [address], [place], [place] [address]    Report Status PENDING  Incomplete    IMAGING RESULTS:  AICD in place Cardiomegaly Bilateral calcified nodules indicative of old calcified granuloma  personally reviewed the films  Ultrasound of the legs showed no DVT.  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 secondary wound infection Need to discuss with her dermatologist Pt currently on Vanco and ceftriaxone  Change vanco to linezolid  as better for skin and soft tissue infection and also because of CKD avoifd nephrotoxic drugs like vanco May be able to DC ceftriaoxne once culture results  Neuroendocrine tumor of the pancreas diagnosed in 2023 and is on Sandostatin  every 28 days  Anemia secondary to the above and also has had angiodysplasia in the jejunum and has received argon laser therapy She gets iron  infusion as well as PRBC as needed  Congestive heart failure [place]  heart association class IIIb. Followed by cardiology on high-dose was diuretics  Hypoxic resp failure due to COPD/CHF   CKD  Single kidney:Rt nephrectomy for RCC   A-fib not on Eliquis  because of the GI bleed  AICD  B/l THA   I have personally spent  -75--minutes involved in face-to-face and non-face-to-face activities for this patient on the day of the visit. Professional time spent includes the following activities: Preparing to see the patient (review of tests), Obtaining and/or reviewing separately obtained history (admission/discharge record), Performing a medically appropriate examination and/or evaluation , Ordering medications/tests/procedures, referring and communicating with other health  care professionals, Documenting clinical information in the EMR, Independently interpreting results (not separately reported), Communicating results to the patient/family/caregiver, Counseling and educating the patient/family/caregiver and Care coordination (not separately reported).   This involved complex antimicrobial management  ________________________________________________ Discussed with patient, requesting provider Note:  This document was prepared using Dragon voice recognition software and may include unintentional dictation errors.

## 2023-06-16 NOTE — Progress Notes (Signed)
This encounter was created in error - please disregard.

## 2023-06-16 NOTE — Plan of Care (Signed)
  Problem: Education: Goal: Knowledge of General Education information will improve Description: Including pain rating scale, medication(s)/side effects and non-pharmacologic comfort measures 06/16/2023 0423 by Justina Oman, RN Outcome: Progressing 06/16/2023 0423 by Justina Oman, RN Outcome: Progressing   Problem: Health Behavior/Discharge Planning: Goal: Ability to manage health-related needs will improve 06/16/2023 0423 by Justina Oman, RN Outcome: Progressing 06/16/2023 0423 by Justina Oman, RN Outcome: Progressing   Problem: Clinical Measurements: Goal: Ability to maintain clinical measurements within normal limits will improve 06/16/2023 0423 by Justina Oman, RN Outcome: Progressing 06/16/2023 0423 by Justina Oman, RN Outcome: Progressing Goal: Will remain free from infection 06/16/2023 0423 by Justina Oman, RN Outcome: Progressing 06/16/2023 0423 by Justina Oman, RN Outcome: Progressing Goal: Diagnostic test results will improve 06/16/2023 0423 by Justina Oman, RN Outcome: Progressing 06/16/2023 0423 by Justina Oman, RN Outcome: Progressing Goal: Respiratory complications will improve 06/16/2023 0423 by Justina Oman, RN Outcome: Progressing 06/16/2023 0423 by Justina Oman, RN Outcome: Progressing Goal: Cardiovascular complication will be avoided 06/16/2023 0423 by Justina Oman, RN Outcome: Progressing 06/16/2023 0423 by Justina Oman, RN Outcome: Progressing   Problem: Activity: Goal: Risk for activity intolerance will decrease 06/16/2023 0423 by Justina Oman, RN Outcome: Progressing 06/16/2023 0423 by Justina Oman, RN Outcome: Progressing   Problem: Nutrition: Goal: Adequate nutrition will be maintained 06/16/2023 0423 by Justina Oman, RN Outcome: Progressing 06/16/2023 0423 by Justina Oman, RN Outcome: Progressing   Problem: Coping: Goal: Level of anxiety will  decrease 06/16/2023 0423 by Justina Oman, RN Outcome: Progressing 06/16/2023 0423 by Justina Oman, RN Outcome: Progressing   Problem: Elimination: Goal: Will not experience complications related to bowel motility 06/16/2023 0423 by Justina Oman, RN Outcome: Progressing 06/16/2023 0423 by Justina Oman, RN Outcome: Progressing Goal: Will not experience complications related to urinary retention 06/16/2023 0423 by Justina Oman, RN Outcome: Progressing 06/16/2023 0423 by Justina Oman, RN Outcome: Progressing   Problem: Pain Managment: Goal: General experience of comfort will improve and/or be controlled 06/16/2023 0423 by Justina Oman, RN Outcome: Progressing 06/16/2023 0423 by Justina Oman, RN Outcome: Progressing   Problem: Safety: Goal: Ability to remain free from injury will improve 06/16/2023 0423 by Justina Oman, RN Outcome: Progressing 06/16/2023 0423 by Justina Oman, RN Outcome: Progressing   Problem: Skin Integrity: Goal: Risk for impaired skin integrity will decrease 06/16/2023 0423 by Justina Oman, RN Outcome: Progressing 06/16/2023 0423 by Justina Oman, RN Outcome: Progressing   Problem: Clinical Measurements: Goal: Ability to avoid or minimize complications of infection will improve 06/16/2023 0423 by Justina Oman, RN Outcome: Progressing 06/16/2023 0423 by Justina Oman, RN Outcome: Progressing   Problem: Skin Integrity: Goal: Skin integrity will improve 06/16/2023 0423 by Justina Oman, RN Outcome: Progressing 06/16/2023 0423 by Justina Oman, RN Outcome: Progressing

## 2023-06-17 ENCOUNTER — Ambulatory Visit: Payer: PPO | Admitting: Cardiology

## 2023-06-17 DIAGNOSIS — C44709 Unspecified malignant neoplasm of skin of left lower limb, including hip: Secondary | ICD-10-CM | POA: Diagnosis not present

## 2023-06-17 DIAGNOSIS — L03116 Cellulitis of left lower limb: Secondary | ICD-10-CM | POA: Diagnosis not present

## 2023-06-17 LAB — CBC
HCT: 24.8 % — ABNORMAL LOW (ref 36.0–46.0)
Hemoglobin: 7.2 g/dL — ABNORMAL LOW (ref 12.0–15.0)
MCH: 24.5 pg — ABNORMAL LOW (ref 26.0–34.0)
MCHC: 29 g/dL — ABNORMAL LOW (ref 30.0–36.0)
MCV: 84.4 fL (ref 80.0–100.0)
Platelets: 205 10*3/uL (ref 150–400)
RBC: 2.94 MIL/uL — ABNORMAL LOW (ref 3.87–5.11)
RDW: 21.2 % — ABNORMAL HIGH (ref 11.5–15.5)
WBC: 3.8 10*3/uL — ABNORMAL LOW (ref 4.0–10.5)
nRBC: 0 % (ref 0.0–0.2)

## 2023-06-17 LAB — BASIC METABOLIC PANEL
Anion gap: 9 (ref 5–15)
BUN: 49 mg/dL — ABNORMAL HIGH (ref 8–23)
CO2: 27 mmol/L (ref 22–32)
Calcium: 8.5 mg/dL — ABNORMAL LOW (ref 8.9–10.3)
Chloride: 98 mmol/L (ref 98–111)
Creatinine, Ser: 1.49 mg/dL — ABNORMAL HIGH (ref 0.44–1.00)
GFR, Estimated: 34 mL/min — ABNORMAL LOW (ref >60)
Glucose, Bld: 103 mg/dL — ABNORMAL HIGH (ref 70–99)
Potassium: 4.8 mmol/L (ref 3.5–5.1)
Sodium: 134 mmol/L — ABNORMAL LOW (ref 135–145)

## 2023-06-17 NOTE — Plan of Care (Signed)
  Problem: Education: Goal: Knowledge of General Education information will improve Description: Including pain rating scale, medication(s)/side effects and non-pharmacologic comfort measures Outcome: Progressing   Problem: Clinical Measurements: Goal: Respiratory complications will improve Outcome: Progressing   Problem: Activity: Goal: Risk for activity intolerance will decrease Outcome: Progressing   Problem: Nutrition: Goal: Adequate nutrition will be maintained Outcome: Progressing   Problem: Coping: Goal: Level of anxiety will decrease Outcome: Progressing   Problem: Safety: Goal: Ability to remain free from injury will improve Outcome: Progressing   Problem: Skin Integrity: Goal: Risk for impaired skin integrity will decrease Outcome: Progressing   Problem: Skin Integrity: Goal: Skin integrity will improve Outcome: Progressing

## 2023-06-17 NOTE — Progress Notes (Signed)
PROGRESS NOTE   HPI was taken from Dr. Arville Care: [name] is a 89 y.o. female with medical history significant for diastolic CHF, atrial fibrillation, osteoarthritis, aortic valve stenosis, stage III CKD, COPD, coronary artery disease, hypertension and dyslipidemia, who presented to the emergency room with acute onset of worsening left lower extremity swelling with associated erythema and induration with warmth and tenderness.  She has significant scaling on both lower extremities.  Her left leg was much more swollen than the right.  She denies any nausea or vomiting or abdominal pain.  No chest pain or palpitations.  No cough or wheezing.  She was seen earlier today in the heart failure clinic and referred to the emergency room.  She was recently started on p.o. Keflex for suspected cellulitis.  She noticed foul-smelling drainage from her wounds.  She has chills without measured fever.   ED Course: When the patient came to the ER, BP was 133/58, pulse oximetry of 100% on 2 L of O2 by nasal cannula and with otherwise normal vital signs.  Sodium was 134 and chloride 93 with a BUN of 58 and creatinine 1.86 compared to 48/1.69 with calcium 8.5.  BNP was 349.6 compared to 71.3 on 06/09/2023.  High-sensitivity troponin I was 43 twice.  CBC showed hemoglobin 7.8 hematocrit 27.1 compared to 8.1 and 28.9 on 06/09/2023 with microcytosis. EKG as reviewed by me : EKG showed atrial sensed ventricular paced rhythm with prolonged AV conduction rate of 60 Imaging: 2 view chest x-ray showed cardiomegaly with small pleural effusions and no substantial change compared to 05/27/2023.   The patient was given 1 mg of IV Bumex as well as IV vancomycin.  She will be admitted to a medical bed for further evaluation and management.   [name]  WUJ:[id number] DOB: [birth year] DOA: 06/13/2023 PCP: [name], MD  Assessment & Plan:   Active Problems:   Cellulitis of left lower extremity   Acute on chronic  diastolic CHF (congestive heart failure) (HCC)   Dyslipidemia   GERD without esophagitis  Assessment and Plan: Cellulitis of left lower extremity: continue on IV rocephin & linezolid as per ID. Vanco d/c. Continue w/ wound care. [place] of b/l LE neg for DVT. Wound cx growing staph aureus, providencia rettgeri.  Hx of skin cancer (unknown type as per pt, possible squamous cell carcinoma as per H&P) of left foot that is managed by derm outpatient. Skin cancer has likely reoccurred & pt may need a repeat biopsy outpatient   Acute on chronic diastolic CHF: echo on 05/28/2023 revealed an EF of 65 to 70% and diastolic function was indeterminant then. Still short of breath. Repeat CXR showed worsening CHF. IV lasix increased to BID on 06/16/23 and fluid restriction. Strict I/Os.   Chronic hypoxic respiratory failure: continue on supplemental oxygen, chronically on 2L [place]    HLD: continue on statin    GERD: continue on PPI   Pancreatic cancer: neuroendocrine dx in 2023. Management per onco outpatient   DVT prophylaxis: lovenox  Code Status: DNR Family Communication:  Disposition Plan: depends on PT/OT recs   Level of care: Med-Surg  Status is: Inpatient Remains inpatient appropriate because: severity of illness    Consultants:  ID  Procedures:   Antimicrobials: rocephin, linezolid   Subjective: Pt still c/o shortness of breath   Objective: Vitals:   06/16/23 1512 06/16/23 2004 06/17/23 0350 06/17/23 0834  BP: (!) 145/53 (!) 142/77 134/67 (!) 130/54  Pulse: 60 (!) 59 (!) 58  61  Resp: 20 20 20 18   Temp: 97.6 F (36.4 C) 97.6 F (36.4 C) 97.9 F (36.6 C) 98.4 F (36.9 C)  TempSrc: Oral Oral Oral Oral  SpO2: 100% 100% 100% 100%  Weight:      Height:        Intake/Output Summary (Last 24 hours) at 06/17/2023 0845 Last data filed at 06/17/2023 0409 Gross per 24 hour  Intake --  Output 800 ml  Net -800 ml   Filed Weights   06/14/23 0134  Weight: 63.5 kg     Examination:  General exam: Appears uncomfortable  Respiratory system: diminished breath sounds b/l  Cardiovascular system: S1/S2+. No rubs or clicks  Gastrointestinal system: abd is soft, NT, ND & hypoactive bowel sounds  Central nervous system: alert & oriented. Moves all extremities  Psychiatry: judgement and insight appears at baseline. Flat mood and affect     Data Reviewed: I have personally reviewed following labs and imaging studies  CBC: Recent Labs  Lab 06/13/23 1552 06/14/23 0618 06/15/23 0907 06/16/23 0509 06/17/23 0441  WBC 5.3 5.0 4.3 4.4 3.8*  HGB 7.8* 7.2* 7.4* 7.8* 7.2*  HCT 27.1* 24.4* 26.0* 27.0* 24.8*  MCV 85.8 82.4 84.1 84.9 84.4  PLT 243 233 237 244 205   Basic Metabolic Panel: Recent Labs  Lab 06/13/23 1552 06/14/23 0618 06/15/23 0609 06/16/23 0509 06/17/23 0441  NA 134* 136  --  138 134*  K 4.0 3.5  --  4.9 4.8  CL 93* 97*  --  98 98  CO2 29 27  --  29 27  GLUCOSE 99 125*  --  102* 103*  BUN 58* 62*  --  52* 49*  CREATININE 1.86* 1.66* 1.60* 1.52* 1.49*  CALCIUM 8.5* 8.3*  --  8.9 8.5*   GFR: Estimated Creatinine Clearance: 24.9 mL/min (A) (by C-G formula based on SCr of 1.49 mg/dL (H)). Liver Function Tests: No results for input(s): "AST", "ALT", "ALKPHOS", "BILITOT", "PROT", "ALBUMIN" in the last 168 hours. No results for input(s): "LIPASE", "AMYLASE" in the last 168 hours. No results for input(s): "AMMONIA" in the last 168 hours. Coagulation Profile: No results for input(s): "INR", "PROTIME" in the last 168 hours. Cardiac Enzymes: No results for input(s): "CKTOTAL", "CKMB", "CKMBINDEX", "TROPONINI" in the last 168 hours. BNP (last 3 results) No results for input(s): "PROBNP" in the last 8760 hours. HbA1C: No results for input(s): "HGBA1C" in the last 72 hours. CBG: No results for input(s): "GLUCAP" in the last 168 hours. Lipid Profile: No results for input(s): "CHOL", "HDL", "LDLCALC", "TRIG", "CHOLHDL", "LDLDIRECT" in the  last 72 hours. Thyroid Function Tests: No results for input(s): "TSH", "T4TOTAL", "FREET4", "T3FREE", "THYROIDAB" in the last 72 hours. Anemia Panel: No results for input(s): "VITAMINB12", "FOLATE", "FERRITIN", "TIBC", "IRON", "RETICCTPCT" in the last 72 hours. Sepsis Labs: No results for input(s): "PROCALCITON", "LATICACIDVEN" in the last 168 hours.  Recent Results (from the past 240 hours)  Aerobic/Anaerobic Culture w Gram Stain (surgical/deep wound)     Status: None (Preliminary result)   Collection Time: 06/14/23  8:55 AM   Specimen: Wound  Result Value Ref Range Status   Specimen Description   Final    WOUND Performed at [facility], [address] [place], [place] [address]    Special Requests   Final    LL Performed at [facility], [address] [place], [place] [address]    Gram Stain   Final    RARE WBC SEEN RARE GRAM POSITIVE  COCCI RARE GRAM NEGATIVE RODS Performed at [facility], [address], [place], [place] [address]    Culture   Final    CULTURE REINCUBATED FOR BETTER GROWTH NO ANAEROBES ISOLATED; CULTURE IN PROGRESS FOR 5 DAYS    Report Status PENDING  Incomplete  Aerobic Culture w Gram Stain (superficial specimen)     Status: None (Preliminary result)   Collection Time: 06/16/23  4:20 PM   Specimen: Wound  Result Value Ref Range Status   Specimen Description   Final    WOUND Performed at [facility], [address], [place], [place] [address]    Special Requests   Final    LEG Performed at [facility], [address] [place], [place] [address]    Gram Stain   Final    FEW WBC PRESENT, PREDOMINANTLY PMN FEW GRAM POSITIVE COCCI Performed at [facility], [address], [place], [place] [address]    Culture PENDING  Incomplete   Report Status PENDING  Incomplete         Radiology Studies: DG Chest Port 1 View Result Date: 06/16/2023 CLINICAL DATA:  Short of breath, chest pressure  EXAM: PORTABLE CHEST 1 VIEW COMPARISON:  06/13/2023 FINDINGS: Single frontal view of the chest demonstrate stable multi lead pacemaker. The cardiac silhouette is enlarged but stable. Increased pulmonary vascular congestion, with new left basilar consolidation and increasing small bilateral pleural effusions. No pneumothorax. No acute bony abnormalities. IMPRESSION: 1. Constellation of findings most consistent with worsening congestive heart failure. Electronically Signed   By: Sharlet Salina M.D.   On: 06/16/2023 14:58         Scheduled Meds:  ascorbic acid  500 mg Oral Daily   cyanocobalamin  500 mcg Oral Daily   enoxaparin (LOVENOX) injection  30 mg Subcutaneous Q24H   ferrous gluconate  324 mg Oral Q breakfast   furosemide  40 mg Intravenous Q12H   linezolid  600 mg Oral Q12H   metolazone  2.5 mg Oral Weekly   pantoprazole  40 mg Oral QAC breakfast   potassium chloride  20 mEq Oral BID   And   potassium chloride  20 mEq Oral Every Saturday   rosuvastatin  40 mg Oral Daily   Continuous Infusions:  cefTRIAXone (ROCEPHIN)  IV 2 g (06/16/23 0943)     LOS: 4 days      Charise Killian, MD [facility] Pager [phone]  If 7PM-7AM, please contact night-coverage [email or web address] 06/17/2023, 8:45 AM

## 2023-06-17 NOTE — TOC Initial Note (Signed)
Transition of Care [facility]) - Initial/Assessment Note    Patient Details  Name: [name] MRN: [id number] Date of Birth: 25-Dec-1933  Transition [facility] CM/SW Contact:    Chapman Fitch, RN Phone Number: 06/17/2023, 10:07 AM  Clinical Narrative:                    Patient admitted from home Active with home health RN through [facility], received rollator previous admission.  Has home o2 through [employer] [facility] consulted, currently on IV antibiotics.   Please consult TOC if TOC needs arise      Patient Goals and CMS Choice            Expected Discharge Plan and Services                                              Prior Living Arrangements/Services                       Activities of Daily Living   ADL Screening (condition at time of admission) Independently performs ADLs?: No Does the patient have a NEW difficulty with bathing/dressing/toileting/self-feeding that is expected to last >3 days?: No Does the patient have a NEW difficulty with getting in/out of bed, walking, or climbing stairs that is expected to last >3 days?: Yes (Initiates electronic notice to provider for possible PT consult) Does the patient have a NEW difficulty with communication that is expected to last >3 days?: No Is the patient deaf or have difficulty hearing?: No Does the patient have difficulty seeing, even when wearing glasses/contacts?: No Does the patient have difficulty concentrating, remembering, or making decisions?: No  Permission Sought/Granted                  Emotional Assessment              Admission diagnosis:  Cellulitis of left lower extremity [L03.116] Congestive heart failure, unspecified HF chronicity, unspecified heart failure type (HCC) [I50.9] Patient Active Problem List   Diagnosis Date Noted   Dyslipidemia 06/14/2023   GERD without esophagitis 06/14/2023   Cellulitis of left lower extremity 06/13/2023   Hyponatremia 05/28/2023    Acute on chronic respiratory failure with hypoxia (HCC) 05/28/2023   (HFpEF) heart failure with preserved ejection fraction (HCC)    Renal cell carcinoma s/p nephrectomy 2003(HCC) 11/25/2022   Herpes zoster 09/02/2022   Decreased hearing of right ear 07/25/2022   DNR (do not resuscitate) 07/25/2022   Bilateral impacted cerumen 05/20/2022   Chronic hypoxic respiratory failure, on home oxygen therapy (HCC) 04/03/2022   Primary pancreatic neuroendocrine tumor 03/20/2022   Osteoporosis 10/11/2021   Squamous cell carcinoma of foot, 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   Acute gout due to renal impairment involving left foot 01/30/2021   Vertigo 12/05/2020   History of blood transfusion reaction 04/17/2020   Angiodysplasia of small intestine    Gastric polyps    GI bleed 03/28/2020   Palliative care by specialist    Advanced directives, counseling/discussion    Normocytic anemia 01/06/2020   History of gastritis 11/11/2019   Chronic venous hypertension w ulceration (HCC)    Acute on chronic diastolic CHF (congestive heart failure) (HCC) 11/04/2018   Chronic bilateral pleural effusions 11/04/2018   Iliac artery stenosis, bilateral (HCC) 07/14/2017   Chronic blood loss anemia 11/18/2016   Pulmonary hypertension (  HCC) 12/04/2015   B12 deficiency 05/22/2015   PAD (peripheral artery disease) (HCC)    History of nephrectomy secondary to RCC    TIA (transient ischemic attack) 08/16/2014   Other fatigue 01/14/2014   Chronic diastolic CHF (congestive heart failure) (HCC) 06/04/2013   Mitral valve regurgitation 06/04/2013   Allergic rhinitis 01/01/2013   Chronic cholecystitis with calculus 09/15/2012   Claudication (HCC) 01/21/2012   Lightheadedness 11/26/2011   Hypertension 05/20/2011   HOCM/IHSS 07/14/2009   Hyperlipidemia 04/10/2009   INCONTINENCE, URGE 04/10/2009   Hypertrophic cardiomyopathy (HCC) 10/27/2008   Atrial fibrillation, permanent (HCC) 10/27/2008   SINUS BRADYCARDIA 10/27/2008   Carotid  artery stenosis 10/27/2008   COPD (chronic obstructive pulmonary disease) (HCC) 10/27/2008   CKD (chronic kidney disease), stage IIIb (HCC) 10/27/2008   ICD (implantable cardioverter-defibrillator) in place 10/27/2008   PCP:  [name], MD Pharmacy:   [facility] - [place], [place] - [address] [place] [address] Phone: [phone] Fax: [phone]     Social Drivers of Health (SDOH) Social History: SDOH Screenings   Food Insecurity: No Food Insecurity (06/14/2023)  Housing: Low Risk  (06/14/2023)  Transportation Needs: No Transportation Needs (06/14/2023)  Utilities: Not At Risk (06/14/2023)  Depression (PHQ2-9): Low Risk  (02/24/2023)  Financial Resource Strain: Low Risk  (05/28/2022)  Physical Activity: Inactive (09/24/2021)  Social Connections: Socially Isolated (06/14/2023)  Stress: No Stress Concern Present (09/24/2021)  Tobacco Use: Medium Risk (06/13/2023)   SDOH Interventions:     Readmission Risk Interventions    06/17/2023   10:07 AM 01/31/2023    3:53 PM 11/09/2022   12:00 PM  Readmission Risk Prevention Plan  Transportation Screening Complete Complete Complete  PCP or Specialist Appt within 3-5 Days   Complete  HRI or Home Care Consult  Complete Complete  Social Work Consult for Recovery Care Planning/Counseling   Complete  Palliative Care Screening  Not Applicable   Medication Review Oceanographer)  Complete Complete  HRI or Home Care Consult Complete    SW Recovery Care/Counseling Consult Complete    Palliative Care Screening Not Applicable    Skilled Nursing Facility Not Applicable

## 2023-06-18 ENCOUNTER — Other Ambulatory Visit: Payer: Self-pay

## 2023-06-18 ENCOUNTER — Inpatient Hospital Stay: Payer: PPO | Admitting: Hematology

## 2023-06-18 ENCOUNTER — Inpatient Hospital Stay: Payer: PPO

## 2023-06-18 DIAGNOSIS — E785 Hyperlipidemia, unspecified: Secondary | ICD-10-CM | POA: Diagnosis not present

## 2023-06-18 DIAGNOSIS — L03116 Cellulitis of left lower limb: Secondary | ICD-10-CM | POA: Diagnosis not present

## 2023-06-18 DIAGNOSIS — R600 Localized edema: Secondary | ICD-10-CM

## 2023-06-18 DIAGNOSIS — L97929 Non-pressure chronic ulcer of unspecified part of left lower leg with unspecified severity: Secondary | ICD-10-CM

## 2023-06-18 DIAGNOSIS — K219 Gastro-esophageal reflux disease without esophagitis: Secondary | ICD-10-CM | POA: Diagnosis not present

## 2023-06-18 DIAGNOSIS — Z515 Encounter for palliative care: Secondary | ICD-10-CM

## 2023-06-18 DIAGNOSIS — I509 Heart failure, unspecified: Secondary | ICD-10-CM

## 2023-06-18 DIAGNOSIS — I83029 Varicose veins of left lower extremity with ulcer of unspecified site: Secondary | ICD-10-CM

## 2023-06-18 DIAGNOSIS — I83892 Varicose veins of left lower extremities with other complications: Secondary | ICD-10-CM

## 2023-06-18 DIAGNOSIS — I5033 Acute on chronic diastolic (congestive) heart failure: Secondary | ICD-10-CM | POA: Diagnosis not present

## 2023-06-18 LAB — CBC
HCT: 25.8 % — ABNORMAL LOW (ref 36.0–46.0)
Hemoglobin: 7.2 g/dL — ABNORMAL LOW (ref 12.0–15.0)
MCH: 23.6 pg — ABNORMAL LOW (ref 26.0–34.0)
MCHC: 27.9 g/dL — ABNORMAL LOW (ref 30.0–36.0)
MCV: 84.6 fL (ref 80.0–100.0)
Platelets: 217 10*3/uL (ref 150–400)
RBC: 3.05 MIL/uL — ABNORMAL LOW (ref 3.87–5.11)
RDW: 21.2 % — ABNORMAL HIGH (ref 11.5–15.5)
WBC: 3.6 10*3/uL — ABNORMAL LOW (ref 4.0–10.5)
nRBC: 0 % (ref 0.0–0.2)

## 2023-06-18 LAB — BASIC METABOLIC PANEL
Anion gap: 8 (ref 5–15)
BUN: 45 mg/dL — ABNORMAL HIGH (ref 8–23)
CO2: 30 mmol/L (ref 22–32)
Calcium: 8.7 mg/dL — ABNORMAL LOW (ref 8.9–10.3)
Chloride: 98 mmol/L (ref 98–111)
Creatinine, Ser: 1.43 mg/dL — ABNORMAL HIGH (ref 0.44–1.00)
GFR, Estimated: 35 mL/min — ABNORMAL LOW (ref >60)
Glucose, Bld: 104 mg/dL — ABNORMAL HIGH (ref 70–99)
Potassium: 4.5 mmol/L (ref 3.5–5.1)
Sodium: 136 mmol/L (ref 135–145)

## 2023-06-18 NOTE — Progress Notes (Signed)
Date of Admission:  06/13/2023      ID: [name] is a 89 y.o. female Active Problems:   Acute on chronic diastolic CHF (congestive heart failure) (HCC)   Cellulitis of left lower extremity   Dyslipidemia   GERD without esophagitis  [name] is a 89 y.o. 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  She was asked to continue torsemide 100 mg twice daily and metolazone 2.5 mg weekly, she recently was started on Keflex 500 mg twice daily for the left leg wound which was worsening and she wanted her to go to the ED.  Patient came to the ED In the afternoon  Subjective: Pt says the leg is burning  Medications:   ascorbic acid  500 mg Oral Daily   cyanocobalamin  500 mcg Oral Daily   enoxaparin (LOVENOX) injection  30 mg Subcutaneous Q24H   ferrous gluconate  324 mg Oral Q breakfast   furosemide  40 mg Intravenous Q12H   linezolid  600 mg Oral Q12H   metolazone  2.5 mg Oral Weekly   pantoprazole  40 mg Oral QAC breakfast   potassium chloride  20 mEq Oral BID   And   potassium chloride  20 mEq Oral Every Saturday   rosuvastatin  40 mg Oral Daily    Objective: Vital signs in last 24 hours: Patient Vitals for the past 24 hrs:  BP Temp Temp src Pulse Resp SpO2  06/18/23 0824 (!) 136/56 98 F (36.7 C) -- 65 18 100 %  06/18/23 0341 135/83 (!) 97.4 F (36.3 C) Oral 60 20 100 %  06/17/23 1932 124/65 98.4 F (36.9 C) Oral (!) 59 20 100 %  06/17/23 1739 (!)  136/43 98.3 F (36.8 C) Oral (!) 59 18 100 %     LDA Foley Central lines Other catheters  PHYSICAL EXAM:  General: Alert, cooperative, no distress, appears stated age.  Lungs: Clear to auscultation bilaterally. No Wheezing or Rhonchi. No rales. Heart: Regular rate and rhythm, no murmur, rub or gallop. Abdomen: Soft, non-tender,not distended. Bowel sounds normal. No masses Extremities: b/l venous edema and stauss- left stasis ulceration          06/13/23   Left foot- verrucous lesion for many months-  Skin: actinic changes Lymph: Cervical, supraclavicular normal. Neurologic: Grossly non-focal  Lab Results    Latest Ref Rng & Units 06/18/2023    4:41 AM 06/17/2023    4:41 AM 06/16/2023    5:09 AM  CBC  WBC 4.0 - 10.5 K/uL 3.6  3.8  4.4   Hemoglobin 12.0 - 15.0 g/dL 7.2  7.2  7.8   Hematocrit 36.0 - 46.0 % 25.8  24.8  27.0   Platelets 150 - 400 K/uL 217  205  244        Latest Ref Rng & Units 06/18/2023    4:41 AM 06/17/2023    4:41 AM 06/16/2023    5:09 AM  CMP  Glucose 70 - 99 mg/dL 098  103  102   BUN 8 - 23 mg/dL 45  49  52   Creatinine 0.44 - 1.00 mg/dL 1.61  0.96  0.45   Sodium 135 - 145 mmol/L 136  134  138   Potassium 3.5 - 5.1 mmol/L 4.5  4.8  4.9   Chloride 98 - 111 mmol/L 98  98  98   CO2 22 - 32 mmol/L 30  27  29    Calcium 8.9 - 10.3 mg/dL 8.7  8.5  8.9       Microbiology: 2/10- this culture was taken after cleaning with normal saline to avoid contamination Kleb aerogenes MSSA  06/14/23 Stenotrophomonas ( colonization- not a true pathogen Studies/Results: DG Chest Port 1 View Result Date: 06/16/2023 CLINICAL DATA:  Short of breath, chest pressure EXAM: PORTABLE CHEST 1 VIEW COMPARISON:  06/13/2023 FINDINGS: Single frontal view of the chest demonstrate stable multi lead pacemaker. The cardiac silhouette is enlarged but stable. Increased pulmonary vascular congestion, with new left basilar consolidation and increasing small bilateral pleural effusions.  No pneumothorax. No acute bony abnormalities. IMPRESSION: 1. Constellation of findings most consistent with worsening congestive heart failure. Electronically Signed   By: Sharlet Salina M.D.   On: 06/16/2023 14:58     Assessment/Plan: Chronic venous edema of the legs with venous ulceration and cellulitis of left leg? 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 Followed by cardiology on high-dose was diuretics   Hypoxic resp failure due to COPD/CHF     CKD   Single kidney:Rt nephrectomy for RCC     A-fib not on Eliquis because of the GI bleed   AICD   B/l THA   I spoke to her Dermatologist Dr.Miktov, he will do biopsy of the foot lesion as OP as suspicion for scc

## 2023-06-18 NOTE — Progress Notes (Signed)
PROGRESS NOTE   HPI was taken from Dr. Arville Care: [name] is a 89 y.o. female with medical history significant for diastolic CHF, atrial fibrillation, osteoarthritis, aortic valve stenosis, stage III CKD, COPD, coronary artery disease, hypertension and dyslipidemia, who presented to the emergency room with acute onset of worsening left lower extremity swelling with associated erythema and induration with warmth and tenderness.  She has significant scaling on both lower extremities.  Her left leg was much more swollen than the right.  She denies any nausea or vomiting or abdominal pain.  No chest pain or palpitations.  No cough or wheezing.  She was seen earlier today in the heart failure clinic and referred to the emergency room.  She was recently started on p.o. Keflex for suspected cellulitis.  She noticed foul-smelling drainage from her wounds.  She has chills without measured fever.   ED Course: When the patient came to the ER, BP was 133/58, pulse oximetry of 100% on 2 L of O2 by nasal cannula and with otherwise normal vital signs.  Sodium was 134 and chloride 93 with a BUN of 58 and creatinine 1.86 compared to 48/1.69 with calcium 8.5.  BNP was 349.6 compared to 71.3 on 06/09/2023.  High-sensitivity troponin I was 43 twice.  CBC showed hemoglobin 7.8 hematocrit 27.1 compared to 8.1 and 28.9 on 06/09/2023 with microcytosis. EKG as reviewed by me : EKG showed atrial sensed ventricular paced rhythm with prolonged AV conduction rate of 60 Imaging: 2 view chest x-ray showed cardiomegaly with small pleural effusions and no substantial change compared to 05/27/2023.   The patient was given 1 mg of IV Bumex as well as IV vancomycin.  She will be admitted to a medical bed for further evaluation and management.  2/12: Palliative care consult   [name]  ZOX:[id number] DOB: 31-Mar-1934 DOA: 06/13/2023 PCP: [name], MD  Assessment & Plan:   Active Problems:   Cellulitis of left lower  extremity   Acute on chronic diastolic CHF (congestive heart failure) (HCC)   Dyslipidemia   GERD without esophagitis  Assessment and Plan: Cellulitis of left lower extremity: continue on IV rocephin & linezolid as per ID. Vanco d/c. Continue w/ wound care. [place] of b/l LE neg for DVT. Wound cx growing staph aureus, providencia rettgeri.  Hx of skin cancer (unknown type as per pt, possible squamous cell carcinoma as per H&P) of left foot that is managed by derm outpatient.  Outpatient follow-up with her oncology for her skin cancer evaluation  Acute on chronic diastolic CHF: echo on 05/28/2023 revealed an EF of 65 to 70% and diastolic function was indeterminant then. Still short of breath. Repeat CXR showed worsening CHF. IV lasix increased to BID on 06/16/23 and fluid restriction. Strict I/Os.  Net IO Since Admission: -1,786.13 mL [06/18/23 1530]   Chronic hypoxic respiratory failure: continue on supplemental oxygen, chronically on 2L [place]    HLD: continue on statin    GERD: continue on PPI   Pancreatic cancer: neuroendocrine dx in 2023. Management per onco outpatient   DVT prophylaxis: lovenox  Code Status: DNR Family Communication: Updated daughter Olegario Messier over phone Disposition Plan: Home with home health Level of care: Med-Surg  Status is: Inpatient Remains inpatient appropriate because: severity of illness, await palliative care evaluation    Consultants:  ID Palliative care   Antimicrobials: rocephin, linezolid   Subjective:  c/o shortness of breath, feels very tired   Objective: Vitals:   06/17/23 1739 06/17/23 1932 06/18/23  0341 06/18/23 0824  BP: (!) 136/43 124/65 135/83 (!) 136/56  Pulse: (!) 59 (!) 59 60 65  Resp: 18 20 20 18   Temp: 98.3 F (36.8 C) 98.4 F (36.9 C) (!) 97.4 F (36.3 C) 98 F (36.7 C)  TempSrc: Oral Oral Oral   SpO2: 100% 100% 100% 100%  Weight:      Height:        Intake/Output Summary (Last 24 hours) at 06/18/2023 1502 Last data filed at  06/18/2023 0340 Gross per 24 hour  Intake --  Output 1250 ml  Net -1250 ml   Filed Weights   06/14/23 0134  Weight: 63.5 kg    Examination:  General exam: Appears uncomfortable  Respiratory system: diminished breath sounds b/l  Cardiovascular system: S1/S2+. No rubs or clicks  Gastrointestinal system: abd is soft, NT, ND & hypoactive bowel sounds  Central nervous system: alert & oriented. Moves all extremities  Psychiatry: judgement and insight appears at baseline. Flat mood and affect     Data Reviewed: I have personally reviewed following labs and imaging studies  CBC: Recent Labs  Lab 06/14/23 0618 06/15/23 0907 06/16/23 0509 06/17/23 0441 06/18/23 0441  WBC 5.0 4.3 4.4 3.8* 3.6*  HGB 7.2* 7.4* 7.8* 7.2* 7.2*  HCT 24.4* 26.0* 27.0* 24.8* 25.8*  MCV 82.4 84.1 84.9 84.4 84.6  PLT 233 237 244 205 217   Basic Metabolic Panel: Recent Labs  Lab 06/13/23 1552 06/14/23 0618 06/15/23 0609 06/16/23 0509 06/17/23 0441 06/18/23 0441  NA 134* 136  --  138 134* 136  K 4.0 3.5  --  4.9 4.8 4.5  CL 93* 97*  --  98 98 98  CO2 29 27  --  29 27 30   GLUCOSE 99 125*  --  102* 103* 104*  BUN 58* 62*  --  52* 49* 45*  CREATININE 1.86* 1.66* 1.60* 1.52* 1.49* 1.43*  CALCIUM 8.5* 8.3*  --  8.9 8.5* 8.7*     Recent Results (from the past 240 hours)  Aerobic/Anaerobic Culture w Gram Stain (surgical/deep wound)     Status: None (Preliminary result)   Collection Time: 06/14/23  8:55 AM   Specimen: Wound  Result Value Ref Range Status   Specimen Description   Final    WOUND Performed at [facility], [address] [place], [place] [address]    Special Requests   Final    LL Performed at [facility], [address] [place], [place] [address]    Gram Stain   Final    RARE WBC SEEN RARE Romie Minus POSITIVE COCCI RARE GRAM NEGATIVE RODS Performed at [facility], [address] [place], [place] [address]    Culture   Final    ABUNDANT  STAPHYLOCOCCUS AUREUS ABUNDANT PROVIDENCIA RETTGERI MODERATE STENOTROPHOMONAS MALTOPHILIA NO ANAEROBES ISOLATED; CULTURE IN PROGRESS FOR 5 DAYS    Report Status PENDING  Incomplete   Organism ID, Bacteria STAPHYLOCOCCUS AUREUS  Final   Organism ID, Bacteria PROVIDENCIA RETTGERI  Final   Organism ID, Bacteria STENOTROPHOMONAS MALTOPHILIA  Final      Susceptibility   Providencia rettgeri - MIC*    AMPICILLIN >=32 RESISTANT Resistant     CEFEPIME 0.5 SENSITIVE Sensitive     CEFTAZIDIME 2 SENSITIVE Sensitive     CEFTRIAXONE <=0.25 SENSITIVE Sensitive     CIPROFLOXACIN <=0.25 SENSITIVE Sensitive     GENTAMICIN <=1 SENSITIVE Sensitive     IMIPENEM 4 SENSITIVE Sensitive     TRIMETH/SULFA <=20  SENSITIVE Sensitive     AMPICILLIN/SULBACTAM >=32 RESISTANT Resistant     PIP/TAZO >=128 RESISTANT Resistant ug/mL    * ABUNDANT PROVIDENCIA RETTGERI   Staphylococcus aureus - MIC*    CIPROFLOXACIN <=0.5 SENSITIVE Sensitive     ERYTHROMYCIN >=8 RESISTANT Resistant     GENTAMICIN <=0.5 SENSITIVE Sensitive     OXACILLIN <=0.25 SENSITIVE Sensitive     TETRACYCLINE >=16 RESISTANT Resistant     VANCOMYCIN 1 SENSITIVE Sensitive     TRIMETH/SULFA <=10 SENSITIVE Sensitive     CLINDAMYCIN >=8 RESISTANT Resistant     RIFAMPIN <=0.5 SENSITIVE Sensitive     Inducible Clindamycin NEGATIVE Sensitive     LINEZOLID 1 SENSITIVE Sensitive     * ABUNDANT STAPHYLOCOCCUS AUREUS   Stenotrophomonas maltophilia - MIC*    LEVOFLOXACIN 0.5 SENSITIVE Sensitive     TRIMETH/SULFA <=20 SENSITIVE Sensitive     * MODERATE STENOTROPHOMONAS MALTOPHILIA  Aerobic Culture w Gram Stain (superficial specimen)     Status: None (Preliminary result)   Collection Time: 06/16/23  4:20 PM   Specimen: Wound  Result Value Ref Range Status   Specimen Description   Final    WOUND Performed at [facility], [address], [place], [place] [address]    Special Requests   Final    LEG Performed at [facility], [address] [place], [place] [address]    Gram Stain   Final    FEW WBC PRESENT, PREDOMINANTLY PMN FEW GRAM POSITIVE COCCI    Culture   Final    FEW KLEBSIELLA AEROGENES SUSCEPTIBILITIES TO FOLLOW CULTURE REINCUBATED FOR BETTER GROWTH Performed at [facility], [address], [place], [place] [address]    Report Status PENDING  Incomplete         Radiology Studies: No results found.        Scheduled Meds:  ascorbic acid  500 mg Oral Daily   cyanocobalamin  500 mcg Oral Daily   enoxaparin (LOVENOX) injection  30 mg Subcutaneous Q24H   ferrous gluconate  324 mg Oral Q breakfast   furosemide  40 mg Intravenous Q12H   linezolid  600 mg Oral Q12H   metolazone  2.5 mg Oral Weekly   pantoprazole  40 mg Oral QAC breakfast   potassium chloride  20 mEq Oral BID   And   potassium chloride  20 mEq Oral Every Saturday   rosuvastatin  40 mg Oral Daily   Continuous Infusions:  cefTRIAXone (ROCEPHIN)  IV 2 g (06/18/23 1128)     LOS: 5 days   Time spent 35 minutes   Delfino Lovett, MD [facility] Pager [phone]  If 7PM-7AM, please contact night-coverage [email or web address] 06/18/2023, 3:02 PM

## 2023-06-18 NOTE — Consult Note (Signed)
Consultation Note Date: 06/18/2023 at 0930  Patient Name: [name]  DOB: [birth year]  MRN: [id number]  Age / Sex: 89 y.o., female  PCP: [name], MD Referring Physician: Delfino Lovett, MD  HPI/Patient Profile:89 y.o. female with medical history significant for diastolic CHF, atrial fibrillation, osteoarthritis, aortic valve stenosis, stage III CKD, COPD, coronary artery disease, hypertension and dyslipidemia, who presented to the emergency room with acute onset of worsening left lower extremity swelling with associated erythema and induration with warmth and tenderness.  She has significant scaling on both lower extremities.  Her left leg was much more swollen than the right.  She denies any nausea or vomiting or abdominal pain.  No chest pain or palpitations.  No cough or wheezing.  She was seen earlier today in the heart failure clinic and referred to the emergency room.  She was recently started on p.o. Keflex for suspected cellulitis.  She noticed foul-smelling drainage from her wounds.   She is being treated for cellulitis of left lower extremity with IV Rocephin and linezolid as per ID, acute on chronic diastolic CHF with IV Lasix and fluid restriction, chronic hypoxic respiratory failure with 2L of [place].  EMT was consulted to discuss boundaries and goals of care.  Of note, patient is familiar to our service as she has seen my colleagues in previous hospitalizations.  Clinical Assessment and Goals of Care: Extensive chart review completed prior to meeting patient including labs, vital signs, imaging, progress notes, orders, and available advanced directive documents from current and previous encounters. I then met with patient and then with her daughter outside of the room to discuss diagnosis prognosis, GOC, EOL wishes, disposition and options.  I introduced Palliative Medicine as specialized  medical care for people living with serious illness. It focuses on providing relief from the symptoms and stress of a serious illness. The goal is to improve quality of life for both the patient and the family.   Initially, when asked what her thoughts are on her current medical situation, patient shares that she is "tired" and would like to be able to go home.  She shares that it takes a lot for her to get to all of her doctors appointments.  She says she does not want to "give up" but that at some point her body just gets too tired.  I discussed that she mentally is not ready to get about but that perhaps her earthly body is showing signs that it is slowing down.  She was in agreement with this statement, and shares that she is concerned that her daughter will not be in agreement.  I reminded patient has full agency and authority over her medical decision making.  I shared I will help support her and speak with other family to help understand that patient's wishes are the focus of discussions.  While family members may not agree with her decisions, we can speak with them about supporting her.  Patient was very appreciative of my visit and encouraged me to speak  with her daughter [name].  At patient's request, she deferred discussion of her goals of care given that she is in a shared room.  She shares she would like to have privacy in order to discuss next steps for her.  However, she endorses I can speak with her daughter until patient is able to be transferred to a private room.  After meeting with the patient, I spoke with RN and notified charge RN of request to transfer patient to a private room.  After meeting with the patient, I also spoke with her daughter [name] outside of the room.  Space and opportunity provided for [name] to share her thoughts and concerns regarding her mother's current medical situation.  [name] shares she is not ready to "give up" on her mother.  She would like for her mother to  continue with oncology treatments.  Quality versus quantity of life, human mortality, and aging in place discussed.  Hospice philosophy and hospice services briefly detailed.  [name] shares that she is not ready to for her mother to shift to hospice services at this time.  I shared that the patient had said earlier today that she is tired and is ready to shift to hospice but shares concerns that daughter would not be in agreement.  Therapeutic silence, active listening, and emotional support provided.  [name] shares that she reports what ever her mother's wishes are.  Shared that I have not been able to get full detail from her mother given she is in a semiprivate room.  I shared that I am hopeful patient can moved to a private room that goals of care discussions to be continued.  No change in plan of care at this time.  Awaiting further discussion once patient is in a private room.  Primary Decision Maker PATIENT  Physical Exam Vitals reviewed.  Constitutional:      General: She is not in acute distress.    Appearance: She is normal weight.  HENT:     Head: Normocephalic.  Cardiovascular:     Rate and Rhythm: Normal rate.  Pulmonary:     Effort: Pulmonary effort is normal.  Skin:    General: Skin is warm and dry.     Comments: Bilateral LE cellulitis  Neurological:     Mental Status: She is alert and oriented to person, place, and time.  Psychiatric:        Mood and Affect: Mood normal. Mood is not anxious.        Behavior: Behavior normal. Behavior is not agitated.     Palliative Assessment/Data: 50%     Thank you for this consult. Palliative medicine will continue to follow and assist holistically.   Time Total: 75 minutes  Time spent includes: Detailed review of medical records (labs, imaging, vital signs), medically appropriate exam (mental status, respiratory, cardiac, skin), discussed with treatment team, counseling and educating patient, family and staff, documenting  clinical information, medication management and coordination of care.  Signed by: Georgiann Cocker, DNP, FNP-BC Palliative Medicine   Please contact Palliative Medicine Team providers via [facility] for questions and concerns.

## 2023-06-18 NOTE — Assessment & Plan Note (Deleted)
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 -She previously had reaction to blood transfusion, or required multiple premedications for blood transfusion -She was recently hospitalized for anemia and a GI bleeding in July 2024. -She is currently on ESA every 2 weeks, IV iron as needed, and blood transfusion as needed. -Due to her multiple hospital admission for anemia and GI bleeding, she started bevacizumab for AVM on 02/12/2023

## 2023-06-18 NOTE — Plan of Care (Signed)
Problem: Education: Goal: Knowledge of General Education information will improve Description: Including pain rating scale, medication(s)/side effects and non-pharmacologic comfort measures Outcome: Progressing   Problem: Health Behavior/Discharge Planning: Goal: Ability to manage health-related needs will improve Outcome: Progressing   Problem: Clinical Measurements: Goal: Ability to maintain clinical measurements within normal limits will improve Outcome: Progressing Goal: Will remain free from infection Outcome: Progressing Goal: Diagnostic test results will improve Outcome: Progressing Goal: Respiratory complications will improve Outcome: Progressing Goal: Cardiovascular complication will be avoided Outcome: Progressing   Problem: Activity: Goal: Risk for activity intolerance will decrease Outcome: Progressing   Problem: Nutrition: Goal: Adequate nutrition will be maintained Outcome: Progressing   Problem: Coping: Goal: Level of anxiety will decrease Outcome: Progressing   Problem: Elimination: Goal: Will not experience complications related to bowel motility Outcome: Progressing Goal: Will not experience complications related to urinary retention Outcome: Progressing   Problem: Pain Managment: Goal: General experience of comfort will improve and/or be controlled Outcome: Progressing   Problem: Safety: Goal: Ability to remain free from injury will improve Outcome: Progressing   Problem: Skin Integrity: Goal: Risk for impaired skin integrity will decrease Outcome: Progressing   Problem: Clinical Measurements: Goal: Ability to avoid or minimize complications of infection will improve Outcome: Progressing   Problem: Skin Integrity: Goal: Skin integrity will improve Outcome: Progressing

## 2023-06-18 NOTE — Assessment & Plan Note (Deleted)
 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   -Outpatient EUS 03/04/2022 by Dr. 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 Freida Busman did not recommend surgery in multifocal NET and also due to her age and co-morbidities. -She began first line sandostatin injections q28 days on 03/27/22.  -Dotatate PET scan, done 2 weeks after first injection 04/10/22, shows no significant tracer avidity in the pancrease or distant sites. Findings are likely due to close timing relative to the sandostatin injection blocking receptors. When we repeat this for restaging next year, will arrange 4 weeks after sandostatin -She has persistent anemia, likely secondary to GI bleeding from the tumor and AVM, she has had multiple hospital admission for anemia and bleeding.

## 2023-06-18 NOTE — Evaluation (Signed)
Physical Therapy Evaluation Patient Details Name: [name] MRN: [id number] DOB: [birth year] Today's Date: 06/18/2023  History of Present Illness  89 y.o. female with hx of diastolic heart failure, pulmonary hypertension, status post AICD, history of GI bleed, history of neuroendocrine pancreatic cancer, chronic resp failure was on 2 L O2 , has COPD as well. Patient admitted with worsening LE edema, generalized malaise and fatigue. Dx: acute on chronic CHF exacerbation  Clinical Impression  Pt is a pleasant 89 year old female who was admitted for L LE cellulitis. Pt performs bed mobility, transfers, and ambulation with supervision and RW. Pt demonstrates deficits with strength/endurance/mobility. At rest O2 sats at 98% on 3L, however with exertion, increased to 4L with sats at 99%. Pt is grossly at baseline with mobility efforts with good family support at home. Would benefit from skilled PT to address above deficits and promote optimal return to PLOF. Pt will continue to receive skilled PT services while admitted and will defer to TOC/care team for updates regarding disposition planning.       If plan is discharge home, recommend the following: A little help with walking and/or transfers;A little help with bathing/dressing/bathroom;Assistance with cooking/housework;Assist for transportation;Help with stairs or ramp for entrance   Can travel by private vehicle        Equipment Recommendations None recommended by PT  Recommendations for Other Services       Functional Status Assessment Patient has had a recent decline in their functional status and demonstrates the ability to make significant improvements in function in a reasonable and predictable amount of time.     Precautions / Restrictions Precautions Precautions: Fall Recall of Precautions/Restrictions: Intact Restrictions Weight Bearing Restrictions Per Provider Order: No      Mobility  Bed Mobility Overal bed  mobility: Needs Assistance Bed Mobility: Supine to Sit     Supine to sit: Supervision     General bed mobility comments: takes increased time and needs rest break once seated at EOB    Transfers Overall transfer level: Needs assistance Equipment used: Rolling walker (2 wheels) Transfers: Sit to/from Stand Sit to Stand: Supervision           General transfer comment: cues for hand placement. All mobility performed on 4L    Ambulation/Gait Ambulation/Gait assistance: Supervision Gait Distance (Feet): 40 Feet Assistive device: Rolling walker (2 wheels) Gait Pattern/deviations: Step-through pattern       General Gait Details: fatigues quickly and requests to turn around. No safety concerns and is able to use walker effectively. O2 sats maintain at 99% post exertion on 4L, returned to 3L for rest  Stairs            Wheelchair Mobility     Tilt Bed    Modified Rankin (Stroke Patients Only)       Balance Overall balance assessment: Needs assistance Sitting-balance support: Bilateral upper extremity supported, Feet supported Sitting balance-Leahy Scale: Good     Standing balance support: Bilateral upper extremity supported, Reliant on assistive device for balance Standing balance-Leahy Scale: Fair                               Pertinent Vitals/Pain Pain Assessment Pain Assessment: No/denies pain    Home Living Family/patient expects to be discharged to:: Private residence Living Arrangements: Children Available Help at Discharge: Family;Available PRN/intermittently Type of Home: House Home Access: Stairs to enter Entrance Stairs-Rails: Right;Left;Can reach both Entrance [employer]  of Steps: 2   Home Layout: One level Home Equipment: Pharmacist, hospital (2 wheels);Cane - single point;Grab bars - tub/shower;BSC/3in1 Additional Comments: great grandson works 24 hours shifts but is off several days in a row. she has other family  that checks in also. He is Air cabin crew and EMT    Prior Function Prior Level of Function : Independent/Modified Independent             Mobility Comments: baseline 3L at home, uses rollator ADLs Comments: Mod I with bathing, dressing, toileting; family assist with transporation. pt still cooks some     Extremity/Trunk Assessment   Upper Extremity Assessment Upper Extremity Assessment: Generalized weakness (B UE grossly 4/5)    Lower Extremity Assessment Lower Extremity Assessment: Generalized weakness (B LE grossly 4/5)       Communication   Communication Communication: No apparent difficulties    Cognition Arousal: Alert Behavior During Therapy: WFL for tasks assessed/performed   PT - Cognitive impairments: No apparent impairments                       PT - Cognition Comments: pleasant and agreeable to session Following commands: Intact       Cueing       General Comments      Exercises Other Exercises Other Exercises: ambulated to [facility]. Able to transfer on/off safely. Hand off given to OT   Assessment/Plan    PT Assessment Patient needs continued PT services  PT Problem List Decreased strength;Decreased range of motion;Decreased activity tolerance;Decreased balance;Decreased mobility;Decreased knowledge of use of DME       PT Treatment Interventions DME instruction;Gait training;Functional mobility training;Therapeutic activities;Therapeutic exercise;Patient/family education;Balance training    PT Goals (Current goals can be found in the Care Plan section)  Acute Rehab PT Goals Patient Stated Goal: to get better PT Goal Formulation: With patient Time For Goal Achievement: 07/02/23 Potential to Achieve Goals: Good    Frequency Min 1X/week     Co-evaluation               AM-PAC PT "6 Clicks" Mobility  Outcome Measure Help needed turning from your back to your side while in a flat bed without using bedrails?: A Little Help needed moving  from lying on your back to sitting on the side of a flat bed without using bedrails?: A Little Help needed moving to and from a bed to a chair (including a wheelchair)?: A Little Help needed standing up from a chair using your arms (e.g., wheelchair or bedside chair)?: A Little Help needed to walk in hospital room?: A Little Help needed climbing 3-5 steps with a railing? : A Lot 6 Click Score: 17    End of Session Equipment Utilized During Treatment: Oxygen Activity Tolerance: Patient limited by fatigue Patient left:  (left on BSC with OT) Nurse Communication: Mobility status PT Visit Diagnosis: Muscle weakness (generalized) (M62.81);Difficulty in walking, not elsewhere classified (R26.2)    Time: 1610-9604 PT Time Calculation (min) (ACUTE ONLY): 19 min   Charges:   PT Evaluation $PT Eval Low Complexity: 1 Low   PT General Charges $$ ACUTE PT VISIT: 1 Visit         Elizabeth Palau, PT, DPT, GCS [phone]   Stiven Kaspar 06/18/2023, 10:46 AM

## 2023-06-18 NOTE — Evaluation (Signed)
Occupational Therapy Evaluation Patient Details Name: [name] MRN: [id number] DOB: 1933/08/28 Today's Date: 06/18/2023   History of Present Illness   89 y.o. female with hx of diastolic heart failure, pulmonary hypertension, status post AICD, history of GI bleed, history of neuroendocrine pancreatic cancer, chronic resp failure was on 2 L O2 [place], has COPD as well. Patient admitted with worsening LE edema, generalized malaise and fatigue. Dx: acute on chronic CHF exacerbation     Clinical Impressions Pt was seen for OT evaluation this date. Prior to hospital admission, pt was living at home with her grandson who works 24 hour shifts. She reports family is in checking on her consistently. She was MOD I with RW use for mobility and MOD I with ADLs. Pt still cooking some.    Pt presents to acute OT demonstrating impaired ADL performance and functional mobility 2/2 weakness and low activity tolerance (See OT problem list for additional functional deficits). Pt currently requires CGA for [facility] transfer to get to recliner. Peri-care performed with SUP. Pt performed oral care seated in recliner with set up assist then transferred back to bed with CGA and RW use. On 3L dropped to 87% with transfers needing rest break and PLB to increase back to [facility]. Returned to supine with SUP.  Pt would benefit from skilled OT services to address noted impairments and functional limitations (see below for any additional details) in order to maximize safety and independence while minimizing falls risk and caregiver burden. Do anticipate the need for follow up OT services upon acute hospital DC.      If plan is discharge home, recommend the following:   A little help with walking and/or transfers;Assistance with cooking/housework;Assist for transportation;A little help with bathing/dressing/bathroom     Functional Status Assessment   Patient has had a recent decline in their functional status and demonstrates  the ability to make significant improvements in function in a reasonable and predictable amount of time.     Equipment Recommendations   None recommended by OT     Recommendations for Other Services         Precautions/Restrictions   Precautions Precautions: Fall Recall of Precautions/Restrictions: Intact Restrictions Weight Bearing Restrictions Per Provider Order: No     Mobility Bed Mobility Overal bed mobility: Needs Assistance Bed Mobility: Sit to Supine       Sit to supine: Supervision        Transfers Overall transfer level: Needs assistance Equipment used: Rolling walker (2 wheels) Transfers: Sit to/from Stand, Bed to chair/wheelchair/BSC Sit to Stand: Supervision     Step pivot transfers: Contact guard assist     General transfer comment: Sup to CGA for all transfers using RW with cueing for safety; 3L for session with drop to 87% with activity requiring PLB and rest breaks      Balance Overall balance assessment: Needs assistance Sitting-balance support: Bilateral upper extremity supported, Feet supported Sitting balance-Leahy Scale: Good       Standing balance-Leahy Scale: Fair Standing balance comment: no LOB during mobility, transfers and peri-care                           ADL either performed or assessed with clinical judgement   ADL Overall ADL's : Needs assistance/impaired     Grooming: Oral care;Sitting;Set up                   Toilet Transfer: Contact guard assist;Ambulation;Rolling walker (  2 wheels);BSC/3in1;Cueing for safety Toilet Transfer Details (indicate cue type and reason): CGA for transfer to [facility] and from [facility] back to bed Toileting- Clothing Manipulation and Hygiene: Contact guard assist;Sit to/from stand;Sitting/lateral lean       Functional mobility during ADLs: Contact guard assist;Rolling walker (2 wheels);Cueing for safety       Vision         Perception         Praxis          Pertinent Vitals/Pain Pain Assessment Pain Assessment: No/denies pain Pain Intervention(s): Monitored during session, Repositioned     Extremity/Trunk Assessment Upper Extremity Assessment Upper Extremity Assessment: Generalized weakness   Lower Extremity Assessment Lower Extremity Assessment: Generalized weakness   Cervical / Trunk Assessment Cervical / Trunk Assessment: Normal   Communication Communication Communication: No apparent difficulties   Cognition Arousal: Alert Behavior During Therapy: WFL for tasks assessed/performed Cognition: No apparent impairments                               Following commands: Intact       Cueing  General Comments          Exercises Other Exercises Other Exercises: Edu on role of OT in acute setting.   Shoulder Instructions      Home Living Family/patient expects to be discharged to:: Private residence Living Arrangements: Children Available Help at Discharge: Family;Available PRN/intermittently Type of Home: House Home Access: Stairs to enter [employer] of Steps: 2 Entrance Stairs-Rails: Right;Left;Can reach both Home Layout: One level     Bathroom Shower/Tub: Chief Strategy Officer: Handicapped height     Home Equipment: Pharmacist, hospital (2 wheels);Cane - single point;Grab bars - tub/shower;BSC/3in1   Additional Comments: great grandson works 24 hours shifts but is off several days in a row. she has other family that checks in also. He is [employer] cabin crew and EMT      Prior Functioning/Environment Prior Level of Function : Independent/Modified Independent             Mobility Comments: baseline 3L at home, uses rollator ADLs Comments: Mod I with bathing, dressing, toileting; family assist with transporation. pt still cooks some    OT Problem List: Decreased strength;Impaired balance (sitting and/or standing);Decreased activity tolerance;Cardiopulmonary status limiting  activity   OT Treatment/Interventions: Self-care/ADL training;DME and/or AE instruction;Therapeutic activities;Balance training;Patient/family education;Energy conservation      OT Goals(Current goals can be found in the care plan section)   Acute Rehab OT Goals Patient Stated Goal: improve endurance OT Goal Formulation: With patient Time For Goal Achievement: 07/02/23 Potential to Achieve Goals: Good ADL Goals Pt Will Perform Lower Body Bathing: with modified independence;sitting/lateral leans;sit to/from stand Pt Will Perform Lower Body Dressing: with modified independence;sit to/from stand;sitting/lateral leans Pt Will Transfer to Toilet: with modified independence;ambulating;regular height toilet Additional ADL Goal #1: Pt will demo/verbalize 1 learned ECS to utilize during ADL performance and mobility 3/3 trials to prevent overexertion.   OT Frequency:  Min 1X/week    Co-evaluation              AM-PAC OT "6 Clicks" Daily Activity     Outcome Measure Help from another person eating meals?: None Help from another person taking care of personal grooming?: None Help from another person toileting, which includes using toliet, bedpan, or urinal?: A Little Help from another person bathing (including washing, rinsing, drying)?: A Little Help from another person  to put on and taking off regular upper body clothing?: A Little Help from another person to put on and taking off regular lower body clothing?: A Little 6 Click Score: 20   End of Session Equipment Utilized During Treatment: Gait belt;Rolling walker (2 wheels);Oxygen Nurse Communication: Mobility status  Activity Tolerance: Patient tolerated treatment well Patient left: with call bell/phone within reach;in bed;with bed alarm set;with nursing/sitter in room  OT Visit Diagnosis: Unsteadiness on feet (R26.81);Muscle weakness (generalized) (M62.81)                Time: 1610-9604 OT Time Calculation (min): 25  min Charges:  OT General Charges $OT Visit: 1 Visit OT Evaluation $OT Eval Moderate Complexity: 1 Mod OT Treatments $Self Care/Home Management : 8-22 mins  Sangeeta Youse, OTR/L 06/18/23, 1:39 PM  Paytyn Mesta E Caillou Minus 06/18/2023, 1:39 PM

## 2023-06-19 DIAGNOSIS — I509 Heart failure, unspecified: Secondary | ICD-10-CM | POA: Diagnosis not present

## 2023-06-19 DIAGNOSIS — I5033 Acute on chronic diastolic (congestive) heart failure: Secondary | ICD-10-CM | POA: Diagnosis not present

## 2023-06-19 DIAGNOSIS — E785 Hyperlipidemia, unspecified: Secondary | ICD-10-CM | POA: Diagnosis not present

## 2023-06-19 DIAGNOSIS — I83892 Varicose veins of left lower extremities with other complications: Secondary | ICD-10-CM | POA: Diagnosis not present

## 2023-06-19 DIAGNOSIS — L03116 Cellulitis of left lower limb: Secondary | ICD-10-CM | POA: Diagnosis not present

## 2023-06-19 DIAGNOSIS — I83029 Varicose veins of left lower extremity with ulcer of unspecified site: Secondary | ICD-10-CM | POA: Diagnosis not present

## 2023-06-19 DIAGNOSIS — L97929 Non-pressure chronic ulcer of unspecified part of left lower leg with unspecified severity: Secondary | ICD-10-CM | POA: Diagnosis not present

## 2023-06-19 DIAGNOSIS — K219 Gastro-esophageal reflux disease without esophagitis: Secondary | ICD-10-CM | POA: Diagnosis not present

## 2023-06-19 DIAGNOSIS — R600 Localized edema: Secondary | ICD-10-CM | POA: Diagnosis not present

## 2023-06-19 LAB — PREPARE RBC (CROSSMATCH)

## 2023-06-19 LAB — AEROBIC/ANAEROBIC CULTURE W GRAM STAIN (SURGICAL/DEEP WOUND)

## 2023-06-19 LAB — CBC
HCT: 24.9 % — ABNORMAL LOW (ref 36.0–46.0)
Hemoglobin: 7 g/dL — ABNORMAL LOW (ref 12.0–15.0)
MCH: 24.1 pg — ABNORMAL LOW (ref 26.0–34.0)
MCHC: 28.1 g/dL — ABNORMAL LOW (ref 30.0–36.0)
MCV: 85.9 fL (ref 80.0–100.0)
Platelets: 221 10*3/uL (ref 150–400)
RBC: 2.9 MIL/uL — ABNORMAL LOW (ref 3.87–5.11)
RDW: 20.9 % — ABNORMAL HIGH (ref 11.5–15.5)
WBC: 3.3 10*3/uL — ABNORMAL LOW (ref 4.0–10.5)
nRBC: 0 % (ref 0.0–0.2)

## 2023-06-19 LAB — BASIC METABOLIC PANEL
Anion gap: 6 (ref 5–15)
BUN: 43 mg/dL — ABNORMAL HIGH (ref 8–23)
CO2: 30 mmol/L (ref 22–32)
Calcium: 8.5 mg/dL — ABNORMAL LOW (ref 8.9–10.3)
Chloride: 98 mmol/L (ref 98–111)
Creatinine, Ser: 1.56 mg/dL — ABNORMAL HIGH (ref 0.44–1.00)
GFR, Estimated: 32 mL/min — ABNORMAL LOW (ref >60)
Glucose, Bld: 103 mg/dL — ABNORMAL HIGH (ref 70–99)
Potassium: 4.4 mmol/L (ref 3.5–5.1)
Sodium: 134 mmol/L — ABNORMAL LOW (ref 135–145)

## 2023-06-19 MED ORDER — DIPHENHYDRAMINE HCL 50 MG/ML IJ SOLN
25.0000 mg | Freq: Once | INTRAMUSCULAR | Status: AC
  Administered 2023-06-19: 25 mg via INTRAVENOUS
  Filled 2023-06-19: qty 1

## 2023-06-19 MED ORDER — ACETAMINOPHEN 325 MG PO TABS
650.0000 mg | ORAL_TABLET | Freq: Once | ORAL | Status: AC
  Administered 2023-06-19: 650 mg via ORAL
  Filled 2023-06-19: qty 2

## 2023-06-19 MED ORDER — FUROSEMIDE 10 MG/ML IJ SOLN
20.0000 mg | Freq: Once | INTRAMUSCULAR | Status: AC
  Administered 2023-06-20: 20 mg via INTRAVENOUS

## 2023-06-19 MED ORDER — SODIUM CHLORIDE 0.9% IV SOLUTION: Freq: Once | INTRAVENOUS | Status: AC

## 2023-06-19 MED ORDER — PREDNISONE 50 MG PO TABS: 50.0000 mg | ORAL_TABLET | Freq: Every day | ORAL | Status: DC

## 2023-06-19 MED ORDER — LEVOFLOXACIN 250 MG PO TABS
250.0000 mg | ORAL_TABLET | Freq: Every day | ORAL | Status: DC
  Administered 2023-06-19 – 2023-06-20 (×2): 250 mg via ORAL
  Filled 2023-06-19 (×2): qty 1

## 2023-06-19 MED ORDER — PREDNISONE 50 MG PO TABS
50.0000 mg | ORAL_TABLET | Freq: Every day | ORAL | Status: DC
  Administered 2023-06-19: 50 mg via ORAL
  Filled 2023-06-19 (×2): qty 1

## 2023-06-19 MED ORDER — DIPHENHYDRAMINE HCL 25 MG PO CAPS
25.0000 mg | ORAL_CAPSULE | Freq: Once | ORAL | Status: AC
  Administered 2023-06-19: 25 mg via ORAL
  Filled 2023-06-19: qty 1

## 2023-06-19 MED ORDER — FAMOTIDINE IN NACL 20-0.9 MG/50ML-% IV SOLN
20.0000 mg | Freq: Once | INTRAVENOUS | Status: AC
  Administered 2023-06-19: 20 mg via INTRAVENOUS
  Filled 2023-06-19: qty 50

## 2023-06-19 MED ORDER — FUROSEMIDE 10 MG/ML IJ SOLN
20.0000 mg | Freq: Once | INTRAMUSCULAR | Status: AC
  Administered 2023-06-19: 20 mg via INTRAVENOUS

## 2023-06-19 NOTE — Progress Notes (Signed)
Palliative Care Progress Note, Assessment & Plan   Patient Name: [name]       Date: 06/19/2023 DOB: 1933/06/06  Age: 89 y.o. MRN#: [id number] Attending Physician: Delfino Lovett, MD Primary Care Physician: [name], MD Admit Date: 06/13/2023  Subjective: Patient is sitting up in bed, resting in no apparent distress.  She acknowledges my presence and is able to make her wishes known.  No family or friends present during my visit.  HPI: 89 y.o. female with medical history significant for pancreatic nueroendocrine tumor (Sandostatin Q28 days), angiodysplasia of jejunum with bleeding (PRN RBC and iron tranfsusions), diastolic CHF, atrial fibrillation, osteoarthritis, aortic valve stenosis, stage III CKD, COPD, coronary artery disease, hypertension and dyslipidemia, who presented to the emergency room with acute onset of worsening left lower extremity swelling with associated erythema and induration with warmth and tenderness.  She has significant scaling on both lower extremities.  Her left leg was much more swollen than the right.  She denies any nausea or vomiting or abdominal pain.  No chest pain or palpitations.  No cough or wheezing.  She was seen earlier today in the heart failure clinic and referred to the emergency room.  She was recently started on p.o. Keflex for suspected cellulitis.  She noticed foul-smelling drainage from her wounds.    She is being treated for cellulitis of left lower extremity with IV Rocephin and linezolid as per ID, acute on chronic diastolic CHF with IV Lasix and fluid restriction, chronic hypoxic respiratory failure with 2L of [place].   EMT was consulted to discuss boundaries and goals of care.  Of note, patient is familiar to our service as she has seen my colleagues in  previous hospitalizations.  Summary of counseling/coordination of care: Extensive chart review completed prior to meeting patient including labs, vital signs, imaging, progress notes, orders, and available advanced directive documents from current and previous encounters.   After reviewing the patient's chart and assessing the patient at bedside, I spoke with patient in regards to symptom management and goals of care.   Patient shares the swelling in her legs has greatly decreased.  She has no acute complaints of pain or discomfort in her legs at this time.  She shares that she stood up on her own, walked to the sink, and was able to brush her own teeth this morning.  She is very proud that she is more mobile.  She remains hopeful that she will continue to have increased mobility.  No adjustment to [facility] needed at this time.  I attempted to elicit values and goals important to the patient.  I discussed that now that she is in a private room that hopefully we can continue goals of care discussions.  She shares she thought a lot about what we discussed yesterday in addition to talking to her daughter Olegario Messier.  She says she has "more life to live".  She says she is not ready to "throw in the towel".  Discussed that utilizing hospice services does not mean end-of-life.  It allows patients to age in place with aggressive symptom management.  She shares she understands hospice services and that she does not wish to pursue it at  this time.  She would like to go home and see how she does over the next week or so.  I politely asked why a week or so would make a huge difference for her.  She shares that her family is not ready for her to give up and she is fine with this.  Therapeutic silence, active listening, and emotional support provided.  Discussed patient's current acute issues-lower extremity cellulitis-and patient's chronic issues-COPD and pancreatic cancer.  Discussed that her long-term prognosis is poor but  she that end-of-life at the moment.  Quality versus quantity of life reviewed.  I again conveyed patient has full autonomy over her medical decision making.    I reiterated that our discussions are to ensure patient's boundaries and goals of care as far as medical treatments are known.  She remains in agreement with DNR with limited interventions.  She wishes to continue with oncology treatments, blood and iron infusions, and to continue with current medication regimen.  No adjustment to plan of care at this time.  Patient is hopeful she can discharge home soon.  She wishes to continue to meet with outpatient palliative services.  Goals are clear.  PMT remains available to patient and family throughout her hospitalization.  We will step back from daily visits.  Please reengage when appropriate.  Physical Exam Vitals reviewed.  Constitutional:      Appearance: She is normal weight.  HENT:     Head: Normocephalic.  Cardiovascular:     Rate and Rhythm: Normal rate.  Pulmonary:     Effort: Pulmonary effort is normal.  Skin:    General: Skin is warm and dry.     Comments: UTA bilateral LE wounds - dressing are clean, dry, intact  Neurological:     Mental Status: She is alert and oriented to person, place, and time.  Psychiatric:        Mood and Affect: Mood normal. Mood is not anxious.        Behavior: Behavior normal. Behavior is not agitated.             Total Time 50 minutes   Time spent includes: Detailed review of medical records (labs, imaging, vital signs), medically appropriate exam (mental status, respiratory, cardiac, skin), discussed with treatment team, counseling and educating patient, family and staff, documenting clinical information, medication management and coordination of care.  Samara Deist L. Bonita Quin, DNP, FNP-BC Palliative Medicine Team

## 2023-06-19 NOTE — Consult Note (Signed)
Value-Based Care [facility] Liaison Consult Note   06/19/2023  [name] 03-11-1934 [id number]  Insurance: [employer] Primary Care Provider: [name], MD, with [place] [facility], this provider is listed for the transition of care [TOC] follow up appointments  and [facility] calls   [facility] Liaison screened the patient remotely at [facility].   The patient was screened for 30 day readmission hospitalization with noted extreme high risk score for unplanned readmission risk 4 hospital admissions in 6 months.  Patient is currently active with [facility] for care coordination services.  Patient has been engaged by a [employer]  The community based plan of care has focused on disease management and community resource support.    Chart reviewed for admission and reviewed MD progress notes, along with Inpatient [facility] Team and Palliative Care Consult for GOC needs.  Plan: Continue to follow for any additional community care coordination needs for post hospital/community needs. Will update RN Care Coordinator of disposition needs for follow up.   Of note, [facility] services does not replace or interfere with any services that are needed or arranged by inpatient [facility] care management team.   Charlesetta Shanks, RN, BSN, CCM [place]  [facility], [facility] Liaison Direct Dial: [phone] or secure chat Email: [email or web address]

## 2023-06-19 NOTE — Progress Notes (Signed)
Date of Admission:  06/13/2023      ID: [name] is a 89 y.o. female Active Problems:   Acute on chronic diastolic CHF (congestive heart failure) (HCC)   Cellulitis of left lower extremity   Dyslipidemia   GERD without esophagitis   Venous stasis ulcer of left lower leg with edema of left lower leg (HCC)   Congestive heart failure (HCC)  [name] is a 89 y.o. 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  She was asked to continue torsemide 100 mg twice daily and metolazone 2.5 mg weekly, she recently was started on Keflex 500 mg twice daily for the left leg wound which was worsening and she wanted her to go to the ED.  Patient came to the ED In the afternoon  Subjective: Pt is feeling better  Medications:   sodium chloride   Intravenous Once   acetaminophen  650 mg Oral Once   ascorbic acid  500 mg Oral Daily   cyanocobalamin  500 mcg Oral Daily   diphenhydrAMINE  25 mg Oral Once   diphenhydrAMINE  25 mg Intravenous Once   enoxaparin (LOVENOX) injection  30 mg Subcutaneous Q24H   ferrous gluconate  324 mg Oral Q breakfast   furosemide  20 mg Intravenous Once   furosemide  20 mg Intravenous Once   furosemide  40 mg Intravenous Q12H   metolazone  2.5 mg Oral Weekly   pantoprazole  40 mg Oral QAC breakfast   potassium chloride  20 mEq Oral BID   And   potassium chloride  20 mEq Oral Every Saturday   predniSONE  50 mg Oral Q breakfast   rosuvastatin  40 mg  Oral Daily    Objective: Vital signs in last 24 hours: Patient Vitals for the past 24 hrs:  BP Temp Temp src Pulse Resp SpO2  06/19/23 1510 (!) 127/54 97.8 F (36.6 C) -- (!) 59 20 96 %  06/19/23 0902 101/79 -- -- -- -- --  06/19/23 0841 (!) 126/45 (!) 97.4 F (36.3 C) Oral (!) 59 20 100 %  06/19/23 0500 129/61 98.3 F (36.8 C) Oral 62 18 100 %  06/18/23 2041 (!) 124/56 98 F (36.7 C) -- (!) 59 16 100 %  06/18/23 1959 (!) 133/58 99 F (37.2 C) Oral (!) 59 18 99 %      PHYSICAL EXAM:  General: Alert, cooperative, no distress, appears stated age.  Lungs: Clear to auscultation bilaterally. No Wheezing or Rhonchi. No rales. Heart: Regular rate and rhythm, no murmur, rub or gallop. Abdomen: Soft, non-tender,not distended. Bowel sounds normal. No masses Extremities: b/l venous edema and stauss- left stasis ulceration          06/13/23   Left foot- verrucous lesion for many months-  Skin: actinic changes Lymph: Cervical, supraclavicular normal. Neurologic: Grossly non-focal  Lab Results    Latest Ref Rng & Units 06/19/2023    4:03 AM 06/18/2023    4:41 AM 06/17/2023    4:41 AM  CBC  WBC 4.0 - 10.5 K/uL 3.3  3.6  3.8   Hemoglobin 12.0 - 15.0 g/dL 7.0  7.2  7.2   Hematocrit 36.0 - 46.0 % 24.9  25.8  24.8   Platelets 150 - 400 K/uL 221  217  205        Latest Ref Rng & Units 06/19/2023    4:03 AM 06/18/2023    4:41 AM 06/17/2023    4:41 AM  CMP  Glucose 70 - 99 mg/dL 284  132  440   BUN 8 - 23 mg/dL 43  45  49   Creatinine 0.44 - 1.00 mg/dL 1.02  7.25  3.66   Sodium 135 - 145 mmol/L 134  136  134   Potassium 3.5 - 5.1 mmol/L 4.4  4.5  4.8   Chloride 98 - 111 mmol/L 98  98  98   CO2 22 - 32 mmol/L 30  30  27    Calcium 8.9 - 10.3 mg/dL 8.5  8.7  8.5       Microbiology: 2/10- this culture was taken after cleaning with normal saline to avoid contamination Kleb aerogenes MSSA  06/14/23 Stenotrophomonas ( colonization- not a true pathogen Studies/Results: No  results found.    Assessment/Plan: Chronic venous edema of the legs with venous ulceration  of left leg Had scabs and they were colonized with various bacteria- scabs have bene removed and she is feeling better  squamous cell carcinoma of the left foot  Pt currently on  ceftriaxone-DC and change to levaquin 250mg  QD for 4 more days MSSA, klesiella,providencia and steno-  2/8 stenotrophomonas is a contaminant/colonization-   Neuroendocrine tumor of the pancreas diagnosed in 2023 and is on Sandostatin every 28 days   Anemia secondary to the above and also has had angiodysplasia in the jejunum and has received argon laser therapy She gets iron infusion as well as PRBC as needed She is getting PRBC today   Congestive heart failure [place] heart association class IIIb. Followed by cardiology on high-dose was diuretics   Hypoxic resp failure due to COPD/CHF     CKD   Single kidney:Rt nephrectomy for RCC     A-fib not on Eliquis because of the GI bleed   AICD   B/l THA   I spoke to her Dermatologist Dr.Miktov, he will do biopsy of the foot lesion as OP as suspicion for scc. Discussed the management with her and hospitalist

## 2023-06-19 NOTE — Progress Notes (Signed)
PROGRESS NOTE   HPI was taken from Dr. Arville Care: [name] is a 89 y.o. female with medical history significant for diastolic CHF, atrial fibrillation, osteoarthritis, aortic valve stenosis, stage III CKD, COPD, coronary artery disease, hypertension and dyslipidemia, who presented to the emergency room with acute onset of worsening left lower extremity swelling with associated erythema and induration with warmth and tenderness.  She has significant scaling on both lower extremities.  Her left leg was much more swollen than the right.  She denies any nausea or vomiting or abdominal pain.  No chest pain or palpitations.  No cough or wheezing.  She was seen earlier today in the heart failure clinic and referred to the emergency room.  She was recently started on p.o. Keflex for suspected cellulitis.  She noticed foul-smelling drainage from her wounds.  She has chills without measured fever.   ED Course: When the patient came to the ER, BP was 133/58, pulse oximetry of 100% on 2 L of O2 by nasal cannula and with otherwise normal vital signs.  Sodium was 134 and chloride 93 with a BUN of 58 and creatinine 1.86 compared to 48/1.69 with calcium 8.5.  BNP was 349.6 compared to 71.3 on 06/09/2023.  High-sensitivity troponin I was 43 twice.  CBC showed hemoglobin 7.8 hematocrit 27.1 compared to 8.1 and 28.9 on 06/09/2023 with microcytosis. EKG as reviewed by me : EKG showed atrial sensed ventricular paced rhythm with prolonged AV conduction rate of 60 Imaging: 2 view chest x-ray showed cardiomegaly with small pleural effusions and no substantial change compared to 05/27/2023.   The patient was given 1 mg of IV Bumex as well as IV vancomycin.  She will be admitted to a medical bed for further evaluation and management.  2/12: Palliative care consult 2/13: 1 PRBC transfusion after premedication   [name]  ZSW:[id number] DOB: 1933/08/23 DOA: 06/13/2023 PCP: [name], MD  Assessment & Plan:    Active Problems:   Cellulitis of left lower extremity   Acute on chronic diastolic CHF (congestive heart failure) (HCC)   Dyslipidemia   GERD without esophagitis   Venous stasis ulcer of left lower leg with edema of left lower leg (HCC)   Congestive heart failure (HCC)  Assessment and Plan: Cellulitis of left lower extremity: continue on IV rocephin per ID. Vanco and Zyvox d/c. Continue w/ wound care. [place] of b/l LE neg for DVT. Wound cx growing staph aureus, providencia rettgeri.  Hx of skin cancer (unknown type as per pt, possible squamous cell carcinoma as per H&P) of left foot that is managed by derm outpatient.  Outpatient follow-up with her oncology and dermatology for her skin cancer evaluation.  Dr. Rivka Safer had spoken to her Dermatologist Dr.Miktov, he will do biopsy of the foot lesion as OP as suspicion for scc   Acute on chronic diastolic CHF: echo on 05/28/2023 revealed an EF of 65 to 70% and diastolic function was indeterminant then. Still short of breath. Repeat CXR showed worsening CHF. IV lasix increased to BID on 06/16/23 and fluid restriction. Strict I/Os.  Net IO Since Admission: -1,496.13 mL [06/19/23 1535]   Anemia of chronic disease Hemoglobin of 7, will transfuse 1 PRBC.  She does need specific blood product and it comes from [facility] due to her history of allergic reaction.  I have ordered premedication  Chronic hypoxic respiratory failure: continue on supplemental oxygen, chronically on 2L [place]    HLD: continue on statin    GERD: continue on  PPI   Pancreatic cancer: neuroendocrine dx in 2023. Management per onco outpatient   DVT prophylaxis: lovenox  Code Status: DNR Family Communication: None at bedside Disposition Plan: Home with home health Level of care: Med-Surg  Status is: Inpatient Remains inpatient appropriate because: severity of illness, await palliative care evaluation    Consultants:  ID Palliative care   Antimicrobials:  rocephin Subjective:  Feeling tired.  Agreeable with blood transfusion, wants to go home  Objective: Vitals:   06/19/23 0500 06/19/23 0841 06/19/23 0902 06/19/23 1510  BP: 129/61 (!) 126/45 101/79 (!) 127/54  Pulse: 62 (!) 59  (!) 59  Resp: 18 20  20   Temp: 98.3 F (36.8 C) (!) 97.4 F (36.3 C)  97.8 F (36.6 C)  TempSrc: Oral Oral    SpO2: 100% 100%  96%  Weight:      Height:        Intake/Output Summary (Last 24 hours) at 06/19/2023 1535 Last data filed at 06/19/2023 1510 Gross per 24 hour  Intake 840 ml  Output 550 ml  Net 290 ml   Filed Weights   06/14/23 0134  Weight: 63.5 kg    Examination:  General exam: Appears uncomfortable  Respiratory system: diminished breath sounds b/l  Cardiovascular system: S1/S2+. No rubs or clicks  Gastrointestinal system: abd is soft, NT, ND & hypoactive bowel sounds  Central nervous system: alert & oriented. Moves all extremities  Psychiatry: judgement and insight appears at baseline. Flat mood and affect  Skin: Actinic changes, left foot has chronic verrucous lesions, bilateral venous stasis and edema   Data Reviewed: I have personally reviewed following labs and imaging studies  CBC: Recent Labs  Lab 06/15/23 0907 06/16/23 0509 06/17/23 0441 06/18/23 0441 06/19/23 0403  WBC 4.3 4.4 3.8* 3.6* 3.3*  HGB 7.4* 7.8* 7.2* 7.2* 7.0*  HCT 26.0* 27.0* 24.8* 25.8* 24.9*  MCV 84.1 84.9 84.4 84.6 85.9  PLT 237 244 205 217 221   Basic Metabolic Panel: Recent Labs  Lab 06/14/23 0618 06/15/23 0609 06/16/23 0509 06/17/23 0441 06/18/23 0441 06/19/23 0403  NA 136  --  138 134* 136 134*  K 3.5  --  4.9 4.8 4.5 4.4  CL 97*  --  98 98 98 98  CO2 27  --  29 27 30 30   GLUCOSE 125*  --  102* 103* 104* 103*  BUN 62*  --  52* 49* 45* 43*  CREATININE 1.66* 1.60* 1.52* 1.49* 1.43* 1.56*  CALCIUM 8.3*  --  8.9 8.5* 8.7* 8.5*     Recent Results (from the past 240 hours)  Aerobic/Anaerobic Culture w Gram Stain (surgical/deep wound)      Status: None   Collection Time: 06/14/23  8:55 AM   Specimen: Wound  Result Value Ref Range Status   Specimen Description   Final    WOUND Performed at [facility], [address], [place], [place] [address]    Special Requests   Final    LL Performed at [facility], [address] [place], [place] [address]    Gram Stain   Final    RARE WBC SEEN RARE Romie Minus POSITIVE COCCI RARE GRAM NEGATIVE RODS    Culture   Final    ABUNDANT STAPHYLOCOCCUS AUREUS ABUNDANT PROVIDENCIA RETTGERI MODERATE STENOTROPHOMONAS MALTOPHILIA NO ANAEROBES ISOLATED Performed at [facility], [address] [place], [place] [address]    Report Status 06/19/2023 FINAL  Final   Organism ID, Bacteria STAPHYLOCOCCUS AUREUS  Final  Organism ID, Bacteria PROVIDENCIA RETTGERI  Final   Organism ID, Bacteria STENOTROPHOMONAS MALTOPHILIA  Final      Susceptibility   Providencia rettgeri - MIC*    AMPICILLIN >=32 RESISTANT Resistant     CEFEPIME 0.5 SENSITIVE Sensitive     CEFTAZIDIME 2 SENSITIVE Sensitive     CEFTRIAXONE <=0.25 SENSITIVE Sensitive     CIPROFLOXACIN <=0.25 SENSITIVE Sensitive     GENTAMICIN <=1 SENSITIVE Sensitive     IMIPENEM 4 SENSITIVE Sensitive     TRIMETH/SULFA <=20 SENSITIVE Sensitive     AMPICILLIN/SULBACTAM >=32 RESISTANT Resistant     PIP/TAZO >=128 RESISTANT Resistant ug/mL    * ABUNDANT PROVIDENCIA RETTGERI   Staphylococcus aureus - MIC*    CIPROFLOXACIN <=0.5 SENSITIVE Sensitive     ERYTHROMYCIN >=8 RESISTANT Resistant     GENTAMICIN <=0.5 SENSITIVE Sensitive     OXACILLIN <=0.25 SENSITIVE Sensitive     TETRACYCLINE >=16 RESISTANT Resistant     VANCOMYCIN 1 SENSITIVE Sensitive     TRIMETH/SULFA <=10 SENSITIVE Sensitive     CLINDAMYCIN >=8 RESISTANT Resistant     RIFAMPIN <=0.5 SENSITIVE Sensitive     Inducible Clindamycin NEGATIVE Sensitive     LINEZOLID 1 SENSITIVE Sensitive     * ABUNDANT STAPHYLOCOCCUS AUREUS   Stenotrophomonas maltophilia  - MIC*    LEVOFLOXACIN 0.5 SENSITIVE Sensitive     TRIMETH/SULFA <=20 SENSITIVE Sensitive     * MODERATE STENOTROPHOMONAS MALTOPHILIA  Aerobic Culture w Gram Stain (superficial specimen)     Status: None (Preliminary result)   Collection Time: 06/16/23  4:20 PM   Specimen: Wound  Result Value Ref Range Status   Specimen Description   Final    WOUND Performed at [facility], [address], [place], [place] [address]    Special Requests   Final    LEG Performed at [facility], [address] [place], [place] [address]    Gram Stain   Final    FEW WBC PRESENT, PREDOMINANTLY PMN FEW GRAM POSITIVE COCCI    Culture   Final    FEW KLEBSIELLA AEROGENES ABUNDANT STAPHYLOCOCCUS AUREUS SUSCEPTIBILITIES TO FOLLOW Performed at [facility], [address], [place], [place] [address]    Report Status PENDING  Incomplete   Organism ID, Bacteria KLEBSIELLA AEROGENES  Final      Susceptibility   Klebsiella aerogenes - MIC*    CEFEPIME 1 SENSITIVE Sensitive     CEFTAZIDIME >=64 RESISTANT Resistant     CEFTRIAXONE >=64 RESISTANT Resistant     CIPROFLOXACIN <=0.25 SENSITIVE Sensitive     GENTAMICIN <=1 SENSITIVE Sensitive     IMIPENEM 1 SENSITIVE Sensitive     TRIMETH/SULFA <=20 SENSITIVE Sensitive     PIP/TAZO >=128 RESISTANT Resistant ug/mL    * FEW KLEBSIELLA AEROGENES         Radiology Studies: No results found.        Scheduled Meds:  sodium chloride   Intravenous Once   acetaminophen  650 mg Oral Once   ascorbic acid  500 mg Oral Daily   cyanocobalamin  500 mcg Oral Daily   diphenhydrAMINE  25 mg Oral Once   diphenhydrAMINE  25 mg Intravenous Once   enoxaparin (LOVENOX) injection  30 mg Subcutaneous Q24H   ferrous gluconate  324 mg Oral Q breakfast   furosemide  20 mg Intravenous Once   furosemide  20 mg Intravenous Once   furosemide  40 mg Intravenous Q12H   metolazone  2.5 mg Oral  Weekly   pantoprazole  40 mg Oral QAC breakfast    potassium chloride  20 mEq Oral BID   And   potassium chloride  20 mEq Oral Every Saturday   predniSONE  50 mg Oral Q breakfast   rosuvastatin  40 mg Oral Daily   Continuous Infusions:  cefTRIAXone (ROCEPHIN)  IV 2 g (06/19/23 0857)   famotidine (PEPCID) IV       LOS: 6 days   Time spent 35 minutes   Delfino Lovett, MD [facility] Pager [phone]  If 7PM-7AM, please contact night-coverage [email or web address] 06/19/2023, 3:35 PM

## 2023-06-19 NOTE — Plan of Care (Signed)
Problem: Education: Goal: Knowledge of General Education information will improve Description Including pain rating scale, medication(s)/side effects and non-pharmacologic comfort measures Outcome: Progressing   Problem: Health Behavior/Discharge Planning: Goal: Ability to manage health-related needs will improve Outcome: Progressing

## 2023-06-20 ENCOUNTER — Other Ambulatory Visit: Payer: Self-pay

## 2023-06-20 ENCOUNTER — Telehealth (HOSPITAL_COMMUNITY): Payer: Self-pay

## 2023-06-20 ENCOUNTER — Telehealth: Payer: Self-pay | Admitting: Infectious Diseases

## 2023-06-20 DIAGNOSIS — I5033 Acute on chronic diastolic (congestive) heart failure: Secondary | ICD-10-CM | POA: Diagnosis not present

## 2023-06-20 DIAGNOSIS — L03116 Cellulitis of left lower limb: Secondary | ICD-10-CM | POA: Diagnosis not present

## 2023-06-20 DIAGNOSIS — I509 Heart failure, unspecified: Secondary | ICD-10-CM | POA: Diagnosis not present

## 2023-06-20 LAB — BASIC METABOLIC PANEL
Anion gap: 10 (ref 5–15)
BUN: 56 mg/dL — ABNORMAL HIGH (ref 8–23)
CO2: 26 mmol/L (ref 22–32)
Calcium: 8.6 mg/dL — ABNORMAL LOW (ref 8.9–10.3)
Chloride: 99 mmol/L (ref 98–111)
Creatinine, Ser: 1.82 mg/dL — ABNORMAL HIGH (ref 0.44–1.00)
GFR, Estimated: 27 mL/min — ABNORMAL LOW (ref >60)
Glucose, Bld: 134 mg/dL — ABNORMAL HIGH (ref 70–99)
Potassium: 5.3 mmol/L — ABNORMAL HIGH (ref 3.5–5.1)
Sodium: 135 mmol/L (ref 135–145)

## 2023-06-20 LAB — AEROBIC CULTURE W GRAM STAIN (SUPERFICIAL SPECIMEN)

## 2023-06-20 LAB — CBC
HCT: 29.6 % — ABNORMAL LOW (ref 36.0–46.0)
Hemoglobin: 8.6 g/dL — ABNORMAL LOW (ref 12.0–15.0)
MCH: 24.9 pg — ABNORMAL LOW (ref 26.0–34.0)
MCHC: 29.1 g/dL — ABNORMAL LOW (ref 30.0–36.0)
MCV: 85.5 fL (ref 80.0–100.0)
Platelets: 231 10*3/uL (ref 150–400)
RBC: 3.46 MIL/uL — ABNORMAL LOW (ref 3.87–5.11)
RDW: 20 % — ABNORMAL HIGH (ref 11.5–15.5)
WBC: 7.6 10*3/uL (ref 4.0–10.5)
nRBC: 0.3 % — ABNORMAL HIGH (ref 0.0–0.2)

## 2023-06-20 MED ORDER — LEVOFLOXACIN 250 MG PO TABS
250.0000 mg | ORAL_TABLET | Freq: Every day | ORAL | 0 refills | Status: DC
  Filled 2023-06-20: qty 4, 4d supply, fill #0

## 2023-06-20 MED ORDER — ROSUVASTATIN CALCIUM 10 MG PO TABS
10.0000 mg | ORAL_TABLET | Freq: Every day | ORAL | 0 refills | Status: DC
  Filled 2023-06-20: qty 30, 30d supply, fill #0

## 2023-06-20 NOTE — TOC Transition Note (Signed)
Transition of Care [facility]) - Discharge Note   Patient Details  Name: [name] MRN: [id number] Date of Birth: 1933/08/19  Transition of Care [facility]) CM/SW Contact:  Allena Katz, LCSW Phone Number: 06/20/2023, 10:07 AM   Clinical Narrative:   Pt has orders to discharge home with [facility]. Pt already active with [facility]. [facility] notified. Toc signing off.    Final next level of care: Home w Home Health Services Barriers to Discharge: Barriers Resolved   Patient Goals and CMS Choice Patient states their goals for this hospitalization and ongoing recovery are:: return home with [facility] CMS Medicare.gov Compare Post Acute Care list provided to:: Patient        Discharge Placement                       Discharge Plan and Services Additional resources added to the After Visit Summary for                              [facility] Agency: [facility] Date [facility] Agency Contacted: 06/20/23   Representative spoke with at [facility] Agency: Adelina Mings  Social Drivers of Health (SDOH) Interventions SDOH Screenings   Food Insecurity: No Food Insecurity (06/14/2023)  Housing: Low Risk  (06/14/2023)  Transportation Needs: No Transportation Needs (06/14/2023)  Utilities: Not At Risk (06/14/2023)  Depression (PHQ2-9): Low Risk  (02/24/2023)  Financial Resource Strain: Low Risk  (05/28/2022)  Physical Activity: Inactive (09/24/2021)  Social Connections: Socially Isolated (06/14/2023)  Stress: No Stress Concern Present (09/24/2021)  Tobacco Use: Medium Risk (06/13/2023)     Readmission Risk Interventions    06/17/2023   10:07 AM 01/31/2023    3:53 PM 11/09/2022   12:00 PM  Readmission Risk Prevention Plan  Transportation Screening Complete Complete Complete  PCP or Specialist Appt within 3-5 Days   Complete  HRI or Home Care Consult  Complete Complete  Social Work Consult for Recovery Care Planning/Counseling   Complete  Palliative Care Screening  Not Applicable   Medication Review Furniture conservator/restorer)  Complete Complete  HRI or Home Care Consult Complete    SW Recovery Care/Counseling Consult Complete    Palliative Care Screening Not Applicable    Skilled Nursing Facility Not Applicable

## 2023-06-20 NOTE — Progress Notes (Signed)
Mobility Specialist - Progress Note   06/20/23 1113  Mobility  Activity Stood at bedside;Dangled on edge of bed  Level of Assistance Standby assist, set-up cues, supervision of patient - no hands on  Assistive Device None  Distance Ambulated (ft) 2 ft  Activity Response Tolerated well  Mobility visit 1 Mobility  Mobility Specialist Start Time (ACUTE ONLY) 1028  Mobility Specialist Stop Time (ACUTE ONLY) 1038  Mobility Specialist Time Calculation (min) (ACUTE ONLY) 10 min   MS responding to Pt's bed alarm. Pt seated EOB upon entry, expressing need to "get dressed". Pt STS from EOB and stood for ~2 mins while gathering items and cleaning the bed. Pt took two lateral steps toward the the [facility] before returning seated. Pt left in fowler position with alarm set and needs within reach. RN notified.   Zetta Bills Mobility Specialist 06/20/23 11:18 AM

## 2023-06-20 NOTE — Telephone Encounter (Signed)
Patients family called wanting to discuss patients recent discharge, the MD who discharged her advised the family that she should be discharged on hospice.

## 2023-06-20 NOTE — Telephone Encounter (Signed)
Pt has been discharged today- I called Daughter and spoke to ehr regarding her condition Pt has two issues 1)left Leg venous edema, venous ulceration, scabbing and colonization by bacteria This will have to be addressed by leg elevation, compression wraps, may be lymphedema clinic- need to discuss with her PCP She has been sent hom on 4 more days of levaquin 250mg  She also has rt leg edema but no ulceration   2) A chronic left foot lesion which the patient thinks she was told by her dermatologist to be SCC. She will have to Follow up with Dr.Miktov Daughter wanted another Oncologist- name given -[place] surgeon Dr.Karina PACI

## 2023-06-20 NOTE — Plan of Care (Signed)
Problem: Education: Goal: Knowledge of General Education information will improve Description: Including pain rating scale, medication(s)/side effects and non-pharmacologic comfort measures Outcome: Progressing   Problem: Health Behavior/Discharge Planning: Goal: Ability to manage health-related needs will improve Outcome: Progressing   Problem: Clinical Measurements: Goal: Ability to maintain clinical measurements within normal limits will improve Outcome: Progressing   Problem: Activity: Goal: Risk for activity intolerance will decrease Outcome: Progressing   Problem: Elimination: Goal: Will not experience complications related to urinary retention Outcome: Progressing

## 2023-06-22 LAB — TYPE AND SCREEN
ABO/RH(D): A NEG
Antibody Screen: POSITIVE
Unit division: 0
Unit division: 0
Unit division: 0

## 2023-06-22 LAB — BPAM RBC
Blood Product Expiration Date: 202503092359
Blood Product Expiration Date: 202503122359
Blood Product Expiration Date: 202503132359
ISSUE DATE / TIME: 202502132052
Unit Type and Rh: 600
Unit Type and Rh: 600
Unit Type and Rh: 9500

## 2023-06-22 NOTE — Discharge Summary (Signed)
Physician Discharge Summary   Patient: [name] MRN: [id number] DOB: Jun 29, 1933  Admit date:     06/13/2023  Discharge date: 06/20/2023  Discharge Physician: Delfino Lovett   PCP: [name], MD   Recommendations at discharge:   Follow-up with outpatient providers as requested  Discharge Diagnoses: Active Problems:   Cellulitis of left lower extremity   Acute on chronic diastolic CHF (congestive heart failure) (HCC)   Dyslipidemia   GERD without esophagitis   Venous stasis ulcer of left lower leg with edema of left lower leg (HCC)   Congestive heart failure [facility])   Hospital Course: Assessment and Plan:  [name] is a 89 y.o. female with medical history significant for diastolic CHF, atrial fibrillation, osteoarthritis, aortic valve stenosis, stage III CKD, COPD, coronary artery disease, hypertension and dyslipidemia, who presented to the emergency room with acute onset of worsening left lower extremity swelling with associated erythema and induration with warmth and tenderness.  She has significant scaling on both lower extremities.  Her left leg was much more swollen than the right.  She denies any nausea or vomiting or abdominal pain.  No chest pain or palpitations.  No cough or wheezing.  She was seen earlier today in the heart failure clinic and referred to the emergency room.  She was recently started on p.o. Keflex for suspected cellulitis.  She noticed foul-smelling drainage from her wounds.  She has chills without measured fever.   ED Course: When the patient came to the ER, BP was 133/58, pulse oximetry of 100% on 2 L of O2 by nasal cannula and with otherwise normal vital signs.  Sodium was 134 and chloride 93 with a BUN of 58 and creatinine 1.86 compared to 48/1.69 with calcium 8.5.  BNP was 349.6 compared to 71.3 on 06/09/2023.  High-sensitivity troponin I was 43 twice.  CBC showed hemoglobin 7.8 hematocrit 27.1 compared to 8.1 and 28.9 on 06/09/2023 with  microcytosis. EKG as reviewed by me : EKG showed atrial sensed ventricular paced rhythm with prolonged AV conduction rate of 60 Imaging: 2 view chest x-ray showed cardiomegaly with small pleural effusions and no substantial change compared to 05/27/2023.   The patient was given 1 mg of IV Bumex as well as IV vancomycin.  She will be admitted to a medical bed for further evaluation and management.   2/12: Palliative care consult 2/13: 1 PRBC transfusion after premedication   Cellulitis of left lower extremity:  left Leg venous edema, venous ulceration, scabbing and colonization by bacteria This will have to be addressed by leg elevation, compression wraps, may be lymphedema clinic- need to discuss with her PCP She has been sent hom on 4 more days of levaquin 250mg  She also has rt leg edema but no ulceration  Treated with IV antibiotics while in the hospital along with wound care. [place] of b/l LE neg for DVT. Wound cx growing staph aureus, providencia rettgeri.  A chronic left foot lesion which the patient thinks she was told by her dermatologist to be SCC. She will have to Follow up with Dr.Miktov Daughter wanted another Oncologist- name given -[place] surgeon Dr.Karina PACI by Dr. Rivka Safer  Hx of skin cancer (unknown type as per pt, possible squamous cell carcinoma as per H&P) of left foot that is managed by derm outpatient.  Outpatient follow-up with her oncology and dermatology for her skin cancer evaluation.  Dr. Rivka Safer had spoken to her Dermatologist Dr.Miktov, he will do biopsy of the foot  lesion as OP as suspicion for scc    Acute on chronic diastolic CHF: echo on 05/28/2023 revealed an EF of 65 to 70% and diastolic function was indeterminant then.  Diuresed with Lasix while in the hospital Net IO Since Admission: -1,496.13 mL [06/19/23 1535]    Anemia of chronic disease Status post 1 PRBC transfusion.  H&H stable   Chronic hypoxic respiratory failure: continue on supplemental  oxygen, chronically on 2L [place]    HLD: continue on statin    GERD: continue on PPI    Pancreatic cancer: neuroendocrine dx in 2023. Management per onco outpatient          Consultants: Infectious disease, palliative care  Disposition: Home health Diet recommendation:  Discharge Diet Orders (From admission, onward)     Start     Ordered   06/20/23 0000  Diet - low sodium heart healthy        06/20/23 1004           Carb modified diet DISCHARGE MEDICATION: Allergies as of 06/20/2023       Reactions   Codeine Nausea And Vomiting, Other (See Comments)   Hallucinations, too- CANNOT HAVE; thinks she may have received Narcan to reverse the effect   Dilaudid [hydromorphone] Other (See Comments)   Excessive Somnolence- Required Narcan X2   Tape Other (See Comments)   CAN TOLERATE ONLY EASY-RELEASE, PAPER TAPE, AS THE SKIN IS VERY THIN AND WILL BRUISE AND TEAR VERY EASILY   Amoxil [amoxicillin] Nausea Only   Asa [aspirin] Other (See Comments)   Told to avoid due to reduced kidney function, has 1 remaining kidney   Ms Contin [morphine] Nausea And Vomiting, Other (See Comments)   Hallucinations, also   Neurontin [gabapentin] Other (See Comments)   "Out of it"   Nsaids Other (See Comments)   Told to avoid due to reduced kidney function, has 1 remaining kidney        Medication List     STOP taking these medications    cephALEXin 500 MG capsule Commonly known as: Keflex   potassium chloride SA 20 MEQ tablet Commonly known as: KLOR-CON M       TAKE these medications    acetaminophen 500 MG tablet Commonly known as: TYLENOL Take 1,000 mg by mouth every 8 (eight) hours.   B-12 PO Take 1 tablet by mouth daily.   CRANBERRY PO Take 1 tablet by mouth daily.   ferrous gluconate 324 MG tablet Commonly known as: FERGON Take 1 tablet (324 mg total) by mouth daily with breakfast.   fluticasone 50 MCG/ACT nasal spray Commonly known as: FLONASE Place 2 sprays into  both nostrils daily as needed for allergies or rhinitis.   HAIR SKIN NAILS PO Take 1 capsule by mouth daily.   levofloxacin 250 MG tablet Commonly known as: LEVAQUIN Take 1 tablet (250 mg total) by mouth daily for 4 days.   metolazone 2.5 MG tablet Commonly known as: ZAROXOLYN Take 1 tablet (2.5 mg total) by mouth once a week. Every Tuesday with 40 meq of potassium   pantoprazole 40 MG tablet Commonly known as: PROTONIX Take 1 tablet (40 mg total) by mouth daily. What changed: when to take this   rosuvastatin 10 MG tablet Commonly known as: CRESTOR Take 1 tablet (10 mg total) by mouth daily. What changed:  medication strength how much to take   torsemide 20 MG tablet Commonly known as: DEMADEX Take 5 tablets (100 mg total) by mouth 2 (two) times  daily.   VITAMIN C PO Take 1 tablet by mouth daily.   VITAMIN D-3 PO Take 2 capsules by mouth daily.               Discharge Care Instructions  (From admission, onward)           Start     Ordered   06/20/23 0000  Discharge wound care:       Comments: weakness   06/20/23 1004            Follow-up Information     [name], MD. Go on 06/24/2023.   Specialty: Family Medicine Why: [facility] Discharge F/UP: 06/24/23 at 11:30am. Please arrive for appointment at 11:15am. Contact information: [address] [place] [address] [phone]         Laurey Morale, MD. Schedule an appointment as soon as possible for a visit on 07/08/2023.   Specialty: Cardiology Why: [facility] Discharge F/UP: Appointment scheduled for 07/08/2023 at 9:30am. Please arrive 5-10 minutes early for appointment. Contact information: [address] [place] [address] [phone]         Malachy Mood, MD. Schedule an appointment as soon as possible for a visit on 06/30/2023.   Specialties: Hematology, Oncology Why: [facility] Discharge F/UP: Appointment 06/30/23 at 10:15am for lab work and 11:00am  appointment with Dr. Fuller Mandril information: [address] [place] [address] [phone]                Discharge Exam: Ceasar Mons Weights   06/14/23 0134  Weight: 63.5 kg   General: Alert, cooperative, no distress, appears stated age.  Lungs: Clear to auscultation bilaterally. No Wheezing or Rhonchi. No rales. Heart: Regular rate and rhythm, no murmur, rub or gallop. Abdomen: Soft, non-tender,not distended. Bowel sounds normal. No masses Extremities: b/l venous edema and stauss- left stasis ulceration  Condition at discharge: fair  The results of significant diagnostics from this hospitalization (including imaging, microbiology, ancillary and laboratory) are listed below for reference.   Imaging Studies: DG Chest Port 1 View Result Date: 06/16/2023 CLINICAL DATA:  Short of breath, chest pressure EXAM: PORTABLE CHEST 1 VIEW COMPARISON:  06/13/2023 FINDINGS: Single frontal view of the chest demonstrate stable multi lead pacemaker. The cardiac silhouette is enlarged but stable. Increased pulmonary vascular congestion, with new left basilar consolidation and increasing small bilateral pleural effusions. No pneumothorax. No acute bony abnormalities. IMPRESSION: 1. Constellation of findings most consistent with worsening congestive heart failure. Electronically Signed   By: Sharlet Salina M.D.   On: 06/16/2023 14:58   US Venous Img Lower Bilateral Result Date: 06/13/2023 CLINICAL DATA:  Bilateral lower extremity swelling. EXAM: BILATERAL LOWER EXTREMITY VENOUS DOPPLER ULTRASOUND TECHNIQUE: Gray-scale sonography with graded compression, as well as color Doppler and duplex ultrasound were performed to evaluate the lower extremity deep venous systems from the level of the common femoral vein and including the common femoral, femoral, profunda femoral, popliteal and calf veins including the posterior tibial, peroneal and gastrocnemius veins when visible. The superficial great  saphenous vein was also interrogated. Spectral Doppler was utilized to evaluate flow at rest and with distal augmentation maneuvers in the common femoral, femoral and popliteal veins. COMPARISON:  None Available. FINDINGS: RIGHT LOWER EXTREMITY Common Femoral Vein: No evidence of thrombus. Normal compressibility, respiratory phasicity and response to augmentation. Saphenofemoral Junction: No evidence of thrombus. Normal compressibility and flow on color Doppler imaging. Profunda Femoral Vein: No evidence of thrombus. Normal compressibility and flow on  color Doppler imaging. Femoral Vein: No evidence of thrombus. Normal compressibility, respiratory phasicity and response to augmentation. Popliteal Vein: No evidence of thrombus. Normal compressibility, respiratory phasicity and response to augmentation. Calf Veins: The RIGHT posterior tibial vein and RIGHT peroneal vein are poorly visualized secondary to right lower extremity edema and ulcers. Superficial Great Saphenous Vein: No evidence of thrombus. Normal compressibility. Venous Reflux:  None. Other Findings:  None. LEFT LOWER EXTREMITY Common Femoral Vein: No evidence of thrombus. Normal compressibility, respiratory phasicity and response to augmentation. Saphenofemoral Junction: No evidence of thrombus. Normal compressibility and flow on color Doppler imaging. Profunda Femoral Vein: No evidence of thrombus. Normal compressibility and flow on color Doppler imaging. Femoral Vein: No evidence of thrombus. Normal compressibility, respiratory phasicity and response to augmentation. Popliteal Vein: No evidence of thrombus. Normal compressibility, respiratory phasicity and response to augmentation. Calf Veins: The LEFT posterior tibial vein and LEFT peroneal vein are poorly visualized secondary to left lower extremity edema and ulcers. Superficial Great Saphenous Vein: No evidence of thrombus. Normal compressibility. Venous Reflux:  None. Other Findings:  None.  IMPRESSION: Limited evaluation of the BILATERAL posterior tibial veins and BILATERAL peroneal veins, without evidence of deep venous thrombosis in either lower extremity. Electronically Signed   By: Aram Candela M.D.   On: 06/13/2023 19:44   DG Chest 2 View Result Date: 06/13/2023 CLINICAL DATA:  Worsening chest pain and shortness of breath. History of pancreatic cancer. EXAM: CHEST - 2 VIEW COMPARISON:  05/27/2023 FINDINGS: The cardio pericardial silhouette is enlarged. Bilateral pulmonary nodules again noted which appear calcified consistent with granulomata. Small bilateral pleural effusions. Left-sided permanent pacemaker/AICD again noted. Bones are diffusely demineralized. IMPRESSION: 1. No substantial change. Cardiomegaly with small pleural effusions. Electronically Signed   By: Kennith Center M.D.   On: 06/13/2023 17:01   CUP PACEART REMOTE DEVICE CHECK Result Date: 06/10/2023 Scheduled remote reviewed. Normal device function.  Permanent AF, OAC contraindicated per EPIC Known therapies off Next remote 91 days. LA, CVRS  ECHOCARDIOGRAM COMPLETE Result Date: 05/28/2023    ECHOCARDIOGRAM REPORT   Patient Name:   MARRION FINAN Date of Exam: 05/28/2023 Medical Rec #:  [id number]         Height:       66.0 in Accession #:    [id number]        Weight:       140.0 lb Date of Birth:  1934/03/01         BSA:          1.718 m Patient Age:    89 years          BP:           128/61 mmHg Patient Gender: F                 HR:           65 bpm. Exam Location:  Inpatient Procedure: 2D Echo, Cardiac Doppler and Color Doppler Indications:    I50.40* Unspecified combined systolic (congestive) and diastolic                 (congestive) heart failure  History:        Patient has prior history of Echocardiogram examinations, most                 recent 01/16/2023. Abnormal ECG and Defibrillator, COPD, Aortic                 Valve Disease and Mitral Valve  Disease, Arrythmias:Atrial                 Fibrillation; Risk  Factors:Dyslipidemia and Hypertension. HOCM.                 Cancer. Moderate mitral stenosis. Moderate aortic stenosis.  Sonographer:    Sheralyn Boatman RDCS Referring Phys: [id number] [facility] IMPRESSIONS  1. Left ventricular ejection fraction, by estimation, is 65 to 70%. The left ventricle has normal function. The left ventricle has no regional wall motion abnormalities. There is mild left ventricular hypertrophy. Left ventricular diastolic parameters are indeterminate.  2. Right ventricular systolic function is moderately reduced. The right ventricular size is moderately enlarged. There is severely elevated pulmonary artery systolic pressure. The estimated right ventricular systolic pressure is 83.9 mmHg.  3. Left atrial size was mildly dilated.  4. Right atrial size was moderately dilated.  5. The mitral valve is degenerative. Mild mitral valve regurgitation. Moderate mitral stenosis. The mean mitral valve gradient is 6.0 mmHg with average heart rate of 60 bpm. MVA 1.3 cm^2 by continuity equation. Moderate mitral annular calcification.  6. Tricuspid valve regurgitation is severe.  7. The aortic valve is tricuspid. There is moderate calcification of the aortic valve. Aortic valve regurgitation is trivial. Moderate aortic valve stenosis. Vmax 2.8 m/s, MG 17 mmHg, AVA 1.1 cm^2, DI 0.31  8. The inferior vena cava is dilated in size with <50% respiratory variability, suggesting right atrial pressure of 15 mmHg. FINDINGS  Left Ventricle: Left ventricular ejection fraction, by estimation, is 65 to 70%. The left ventricle has normal function. The left ventricle has no regional wall motion abnormalities. The left ventricular internal cavity size was normal in size. There is  mild left ventricular hypertrophy. Left ventricular diastolic parameters are indeterminate. Right Ventricle: The right ventricular size is moderately enlarged. No increase in right ventricular wall thickness. Right ventricular systolic function is  moderately reduced. There is severely elevated pulmonary artery systolic pressure. The tricuspid regurgitant velocity is 4.15 m/s, and with an assumed right atrial pressure of 15 mmHg, the estimated right ventricular systolic pressure is 83.9 mmHg. Left Atrium: Left atrial size was mildly dilated. Right Atrium: Right atrial size was moderately dilated. Pericardium: There is no evidence of pericardial effusion. Mitral Valve: The mitral valve is degenerative in appearance. Moderate mitral annular calcification. Mild mitral valve regurgitation. Moderate mitral valve stenosis. MV peak gradient, 23.8 mmHg. The mean mitral valve gradient is 6.0 mmHg with average heart rate of 60 bpm. Tricuspid Valve: The tricuspid valve is grossly normal. Tricuspid valve regurgitation is severe. Aortic Valve: The aortic valve is tricuspid. There is moderate calcification of the aortic valve. Aortic valve regurgitation is trivial. Aortic regurgitation PHT measures 441 msec. Moderate aortic stenosis is present. Aortic valve mean gradient measures 17.0 mmHg. Aortic valve peak gradient measures 28.2 mmHg. Aortic valve area, by VTI measures 1.23 cm. Pulmonic Valve: The pulmonic valve was grossly normal. Pulmonic valve regurgitation is mild. No evidence of pulmonic stenosis. Aorta: The aortic root and ascending aorta are structurally normal, with no evidence of dilitation. Venous: The inferior vena cava is dilated in size with less than 50% respiratory variability, suggesting right atrial pressure of 15 mmHg. IAS/Shunts: No atrial level shunt detected by color flow Doppler. Additional Comments: A device lead is visualized in the right ventricle and right atrium.  LEFT VENTRICLE PLAX 2D LVIDd:         3.70 cm     Diastology LVIDs:  2.40 cm     LV e' medial:    6.64 cm/s LV PW:         1.30 cm     LV E/e' medial:  28.0 LV IVS:        1.20 cm     LV e' lateral:   6.64 cm/s LVOT diam:     2.10 cm     LV E/e' lateral: 28.0 LV SV:         73  LV SV Index:   42 LVOT Area:     3.46 cm  LV Volumes (MOD) LV vol d, MOD A2C: 62.9 ml LV vol d, MOD A4C: 41.6 ml LV vol s, MOD A2C: 13.4 ml LV vol s, MOD A4C: 14.3 ml LV SV MOD A2C:     49.5 ml LV SV MOD A4C:     41.6 ml LV SV MOD BP:      37.3 ml RIGHT VENTRICLE            IVC RV S prime:     6.59 cm/s  IVC diam: 2.80 cm TAPSE (M-mode): 1.1 cm LEFT ATRIUM             Index        RIGHT ATRIUM           Index LA Vol (A2C):   56.1 ml 32.65 ml/m  RA Area:     24.30 cm LA Vol (A4C):   64.0 ml 37.24 ml/m  RA Volume:   75.90 ml  44.17 ml/m LA Biplane Vol: 60.9 ml 35.44 ml/m  AORTIC VALVE                     PULMONIC VALVE AV Area (Vmax):    1.22 cm      PR End Diast Vel: 2.25 msec AV Area (Vmean):   1.20 cm AV Area (VTI):     1.23 cm AV Vmax:           265.75 cm/s AV Vmean:          179.500 cm/s AV VTI:            0.592 m AV Peak Grad:      28.2 mmHg AV Mean Grad:      17.0 mmHg LVOT Vmax:         93.30 cm/s LVOT Vmean:        62.400 cm/s LVOT VTI:          0.210 m LVOT/AV VTI ratio: 0.35 AI PHT:            441 msec  AORTA Ao Root diam: 2.90 cm Ao Asc diam:  3.10 cm MITRAL VALVE                TRICUSPID VALVE MV Area (PHT): 4.31 cm     TV Peak grad:   17.8 mmHg MV Area VTI:   1.24 cm     TV Mean grad:   4.0 mmHg MV Peak grad:  23.8 mmHg    TV Vmax:        2.11 m/s MV Mean grad:  6.0 mmHg     TV Vmean:       85.4 cm/s MV Vmax:       2.44 m/s     TV VTI:         0.51 msec MV Vmean:      100.2 cm/s   TR Peak grad:   68.9 mmHg  MV Decel Time: 176 msec     TR Vmax:        415.00 cm/s MV E velocity: 186.00 cm/s                             SHUNTS                             Systemic VTI:  0.21 m                             Systemic Diam: 2.10 cm Epifanio Lesches MD Electronically signed by Epifanio Lesches MD Signature Date/Time: 05/28/2023/6:29:29 PM    Final    DG Chest Portable 1 View Result Date: 05/27/2023 CLINICAL DATA:  Shortness of breath EXAM: PORTABLE CHEST 1 VIEW COMPARISON:  01/30/2023 FINDINGS:  Left-sided pacing device as before. Cardiomegaly with similar sized pleural effusions. Mild vascular congestion. Aortic atherosclerosis. No pneumothorax IMPRESSION: Cardiomegaly with vascular congestion and similar sized pleural effusions. Overall no significant change compared to prior Electronically Signed   By: Jasmine Pang M.D.   On: 05/27/2023 16:17    Microbiology: Results for orders placed or performed during the hospital encounter of 06/13/23  Aerobic/Anaerobic Culture w Gram Stain (surgical/deep wound)     Status: None   Collection Time: 06/14/23  8:55 AM   Specimen: Wound  Result Value Ref Range Status   Specimen Description   Final    WOUND Performed at [facility], [address] [place], [place] [address]    Special Requests   Final    LL Performed at [facility], [address] [place], [place] [address]    Gram Stain   Final    RARE WBC SEEN RARE Romie Minus POSITIVE COCCI RARE GRAM NEGATIVE RODS    Culture   Final    ABUNDANT STAPHYLOCOCCUS AUREUS ABUNDANT PROVIDENCIA RETTGERI MODERATE STENOTROPHOMONAS MALTOPHILIA NO ANAEROBES ISOLATED Performed at [facility], [address] [place], [place] [address]    Report Status 06/19/2023 FINAL  Final   Organism ID, Bacteria STAPHYLOCOCCUS AUREUS  Final   Organism ID, Bacteria PROVIDENCIA RETTGERI  Final   Organism ID, Bacteria STENOTROPHOMONAS MALTOPHILIA  Final      Susceptibility   Providencia rettgeri - MIC*    AMPICILLIN >=32 RESISTANT Resistant     CEFEPIME 0.5 SENSITIVE Sensitive     CEFTAZIDIME 2 SENSITIVE Sensitive     CEFTRIAXONE <=0.25 SENSITIVE Sensitive     CIPROFLOXACIN <=0.25 SENSITIVE Sensitive     GENTAMICIN <=1 SENSITIVE Sensitive     IMIPENEM 4 SENSITIVE Sensitive     TRIMETH/SULFA <=20 SENSITIVE Sensitive     AMPICILLIN/SULBACTAM >=32 RESISTANT Resistant     PIP/TAZO >=128 RESISTANT Resistant ug/mL    * ABUNDANT PROVIDENCIA RETTGERI   Staphylococcus aureus - MIC*     CIPROFLOXACIN <=0.5 SENSITIVE Sensitive     ERYTHROMYCIN >=8 RESISTANT Resistant     GENTAMICIN <=0.5 SENSITIVE Sensitive     OXACILLIN <=0.25 SENSITIVE Sensitive     TETRACYCLINE >=16 RESISTANT Resistant     VANCOMYCIN 1 SENSITIVE Sensitive     TRIMETH/SULFA <=10 SENSITIVE Sensitive     CLINDAMYCIN >=8 RESISTANT Resistant     RIFAMPIN <=0.5 SENSITIVE Sensitive     Inducible Clindamycin NEGATIVE Sensitive     LINEZOLID 1 SENSITIVE Sensitive     * ABUNDANT STAPHYLOCOCCUS  AUREUS   Stenotrophomonas maltophilia - MIC*    LEVOFLOXACIN 0.5 SENSITIVE Sensitive     TRIMETH/SULFA <=20 SENSITIVE Sensitive     * MODERATE STENOTROPHOMONAS MALTOPHILIA  Aerobic Culture w Gram Stain (superficial specimen)     Status: None   Collection Time: 06/16/23  4:20 PM   Specimen: Wound  Result Value Ref Range Status   Specimen Description   Final    WOUND Performed at [facility], [address], [place], [place] [address]    Special Requests   Final    LEG Performed at [facility], [address], [place], [place] [address]    Gram Stain   Final    FEW WBC PRESENT, PREDOMINANTLY PMN FEW GRAM POSITIVE COCCI Performed at [facility], [address], [place], [place] [address]    Culture   Final    FEW KLEBSIELLA AEROGENES ABUNDANT STAPHYLOCOCCUS AUREUS    Report Status 06/20/2023 FINAL  Final   Organism ID, Bacteria KLEBSIELLA AEROGENES  Final   Organism ID, Bacteria STAPHYLOCOCCUS AUREUS  Final      Susceptibility   Klebsiella aerogenes - MIC*    CEFEPIME 1 SENSITIVE Sensitive     CEFTAZIDIME >=64 RESISTANT Resistant     CEFTRIAXONE >=64 RESISTANT Resistant     CIPROFLOXACIN <=0.25 SENSITIVE Sensitive     GENTAMICIN <=1 SENSITIVE Sensitive     IMIPENEM 1 SENSITIVE Sensitive     TRIMETH/SULFA <=20 SENSITIVE Sensitive     PIP/TAZO >=128 RESISTANT Resistant ug/mL    * FEW KLEBSIELLA AEROGENES   Staphylococcus aureus - MIC*    CIPROFLOXACIN <=0.5 SENSITIVE  Sensitive     ERYTHROMYCIN >=8 RESISTANT Resistant     GENTAMICIN <=0.5 SENSITIVE Sensitive     OXACILLIN <=0.25 SENSITIVE Sensitive     TETRACYCLINE >=16 RESISTANT Resistant     VANCOMYCIN <=0.5 SENSITIVE Sensitive     TRIMETH/SULFA <=10 SENSITIVE Sensitive     CLINDAMYCIN >=8 RESISTANT Resistant     RIFAMPIN <=0.5 SENSITIVE Sensitive     Inducible Clindamycin NEGATIVE Sensitive     LINEZOLID 2 SENSITIVE Sensitive     * ABUNDANT STAPHYLOCOCCUS AUREUS   *Note: Due to a large number of results and/or encounters for the requested time period, some results have not been displayed. A complete set of results can be found in Results Review.    Labs: CBC: Recent Labs  Lab 06/16/23 0509 06/17/23 0441 06/18/23 0441 06/19/23 0403 06/20/23 0419  WBC 4.4 3.8* 3.6* 3.3* 7.6  HGB 7.8* 7.2* 7.2* 7.0* 8.6*  HCT 27.0* 24.8* 25.8* 24.9* 29.6*  MCV 84.9 84.4 84.6 85.9 85.5  PLT 244 205 217 221 231   Basic Metabolic Panel: Recent Labs  Lab 06/16/23 0509 06/17/23 0441 06/18/23 0441 06/19/23 0403 06/20/23 0419  NA 138 134* 136 134* 135  K 4.9 4.8 4.5 4.4 5.3*  CL 98 98 98 98 99  CO2 29 27 30 30 26   GLUCOSE 102* 103* 104* 103* 134*  BUN 52* 49* 45* 43* 56*  CREATININE 1.52* 1.49* 1.43* 1.56* 1.82*  CALCIUM 8.9 8.5* 8.7* 8.5* 8.6*   Liver Function Tests: No results for input(s): "AST", "ALT", "ALKPHOS", "BILITOT", "PROT", "ALBUMIN" in the last 168 hours. CBG: No results for input(s): "GLUCAP" in the last 168 hours.  Discharge time spent: greater than 30 minutes.  Signed: Delfino Lovett, MD [facility] 06/22/2023

## 2023-06-23 ENCOUNTER — Other Ambulatory Visit: Payer: PPO

## 2023-06-23 ENCOUNTER — Other Ambulatory Visit: Payer: Self-pay | Admitting: Family Medicine

## 2023-06-23 ENCOUNTER — Telehealth: Payer: Self-pay | Admitting: *Deleted

## 2023-06-23 DIAGNOSIS — N1832 Chronic kidney disease, stage 3b: Secondary | ICD-10-CM | POA: Diagnosis not present

## 2023-06-23 NOTE — Transitions of Care (Post Inpatient/ED Visit) (Signed)
06/23/2023  Name: [name] MRN: [id number] DOB: 02/24/1934  Today's TOC FU Call Status: Today's TOC FU Call Status:: Successful TOC FU Call Completed TOC FU Call Complete Date: 06/23/23 Patient's Name and Date of Birth confirmed.  Transition Care Management Follow-up Telephone Call Date of Discharge: 06/20/23 Discharge Facility: [facility]) Type of Discharge: Inpatient Admission Primary Inpatient Discharge Diagnosis:: Cellulitis of left lower extremit How have you been since you were released from the hospital?: Better (but I still feel weak) Any questions or concerns?: Yes Patient Questions/Concerns:: Per patient she is still having some black stools. Patient stated she was having them in the hospital. It has decreased some now. RN discussed that is she is feeling weak, dizzy she needs to go to the hospital. Patient Questions/Concerns Addressed: Other: (Patient understands to go to back to the hospital if increase today or feel symptomatic. Patient stated she has an appt with the Dr tomorrow and let him know. Patient stated she had gotten a transfusion in the hospital.)  Items Reviewed: Did you receive and understand the discharge instructions provided?: Yes Medications obtained,verified, and reconciled?: Yes (Medications Reviewed) Any new allergies since your discharge?: No Dietary orders reviewed?: Yes Type of Diet Ordered:: Low sodium heart healthy Do you have support at home?: Yes People in Home: grandchild(ren) Name of Support/Comfort Primary Source: [name]  Medications Reviewed Today: Medications Reviewed Today     Reviewed by Luella Cook, RN (Case Manager) on 06/23/23 at [phone]  Med List Status: <None>   Medication Order Taking? Sig Documenting Provider Last Dose Status Informant  acetaminophen (TYLENOL) 500 MG tablet [id number] Yes Take 1,000 mg by mouth every 8 (eight) hours. [provider] Taking Active Self,  Family Member  Ascorbic Acid (VITAMIN C PO) [id number] Yes Take 1 tablet by mouth daily. [provider] Taking Active Self, Family Member           Med Note Antony Madura, KATHY N   Tue May 27, 2023  6:20 PM) Strength not noted  Cholecalciferol (VITAMIN D-3 PO) [id number] Yes Take 2 capsules by mouth daily. [provider] Taking Active Self, Family Member           Med Note Antony Madura, Arn Medal   Tue May 27, 2023  6:20 PM) Strength not noted  CRANBERRY PO [id number] Yes Take 1 tablet by mouth daily. [provider] Taking Active Self, Family Member           Med Note Antony Madura, KATHY N   Tue May 27, 2023  6:20 PM) Strength not noted  Cyanocobalamin (B-12 PO) [id number] Yes Take 1 tablet by mouth daily. [provider] Taking Active Self, Family Member           Med Note Antony Madura, Arn Medal   Tue May 27, 2023  6:21 PM) Strength not noted  ferrous gluconate (FERGON) 324 MG tablet [id number] Yes Take 1 tablet (324 mg total) by mouth daily with breakfast. Rai, Delene Ruffini, MD Taking Active Self, Family Member  fluticasone (FLONASE) 50 MCG/ACT nasal spray [id number] Yes Place 2 sprays into both nostrils daily as needed for allergies or rhinitis. [name], MD Taking Active Self, Family Member  levofloxacin [facility]) 250 MG tablet [id number] Yes Take 1 tablet (250 mg total) by mouth daily for 4 days. Delfino Lovett, MD Taking Active   metolazone (ZAROXOLYN) 2.5 MG tablet [id number] Yes Take 1 tablet (2.5 mg total) by mouth once a week. Every Tuesday with 40 meq  of potassium Danford, Earl Lites, MD Taking Active Family Member           Med Note Rosalio Macadamia   Fri Jun 13, 2023 10:51 PM)    Multiple Vitamins-Minerals (HAIR SKIN NAILS PO) [id number] Yes Take 1 capsule by mouth daily. [provider] Taking Active Self, Family Member  pantoprazole (PROTONIX) 40 MG tablet [id number] Yes Take 1 tablet (40 mg total) by mouth daily.  Patient taking differently: Take 40 mg by mouth daily  before breakfast.   Rai, Delene Ruffini, MD Taking Active Self, Family Member           Med Note Suezanne Cheshire   Tue Apr 15, 2023 12:13 PM)    rosuvastatin (CRESTOR) 10 MG tablet [id number] Yes Take 1 tablet (10 mg total) by mouth daily. Delfino Lovett, MD Taking Active   torsemide (DEMADEX) 20 MG tablet [id number] Yes Take 5 tablets (100 mg total) by mouth 2 (two) times daily. [place], Alvira Philips, DO Taking Active Self, Family Member           Med Note [facility], CHRISTINE   Mon Jun 02, 2023  3:18 PM)              Home Care and Equipment/Supplies: Were Home Health Services Ordered?: Yes Name of Home Health Agency:: [facility] Has Agency set up a time to come to your home?: Yes First Home Health Visit Date: 06/21/23 Any new equipment or medical supplies ordered?: NA  Functional Questionnaire: Do you need assistance with bathing/showering or dressing?: No Do you need assistance with meal preparation?: Yes Do you need assistance with eating?: No Do you have difficulty maintaining continence: Yes Do you need assistance with getting out of bed/getting out of a chair/moving?: No Do you have difficulty managing or taking your medications?: No  Follow up appointments reviewed: PCP Follow-up appointment confirmed?: Yes Date of PCP follow-up appointment?: 06/24/23 Follow-up Provider: Dr Sharen H[facility] Follow-up appointment confirmed?: Yes Date of Specialist follow-up appointment?: 06/30/23 Follow-Up Specialty Provider:: 0224 DR Fend/ [id number] Dr Freida Busman Do you need transportation to your follow-up appointment?: No Do you understand care options if your condition(s) worsen?: Yes-patient verbalized understanding  SDOH Interventions Today    Flowsheet Row Most Recent Value  SDOH Interventions   Food Insecurity Interventions Intervention Not Indicated  Housing Interventions Intervention Not Indicated  Utilities Interventions Intervention Not Indicated      Interventions Today     Flowsheet Row Most Recent Value  Chronic Disease   Chronic disease during today's visit Other  [cellulitus]  General Interventions   General Interventions Discussed/Reviewed General Interventions Discussed, General Interventions Reviewed, Doctor Visits, Communication with, Labs  George Ina  RN Care Coordinator]  Labs --  [Reviewed CBC and K+]  Doctor Visits Discussed/Reviewed Doctor Visits Discussed, Doctor Visits Reviewed, Specialist, PCP  PCP/Specialist Visits Compliance with follow-up visit  Communication with RN  Education Interventions   Education Provided Provided Education  Provided Verbal Education On Labs, Other  [Monitoring stools]  Pharmacy Interventions   Pharmacy Dicussed/Reviewed Pharmacy Topics Discussed, Pharmacy Topics Reviewed  [Discuss with DR when to restart k+]  Safety Interventions   Safety Discussed/Reviewed Safety Discussed, Fall Risk      Patient declined Sports coach f/u since she wi working with George Ina RN  Care Coordinator  Scarlette Calico Kairen Hallinan BSN RN [employer] [facility] Population Health Care Management Coordinator [email or web address] Direct Dial: [phone]  Fax: [phone] Website: [email or web address]

## 2023-06-24 ENCOUNTER — Encounter: Payer: Self-pay | Admitting: Hematology

## 2023-06-24 ENCOUNTER — Other Ambulatory Visit: Payer: Self-pay

## 2023-06-24 ENCOUNTER — Emergency Department: Payer: PPO

## 2023-06-24 ENCOUNTER — Emergency Department
Admission: EM | Admit: 2023-06-24 | Discharge: 2023-06-25 | Disposition: A | Discharge status: Home / Self Care | Payer: PPO | Attending: Emergency Medicine | Admitting: Emergency Medicine

## 2023-06-24 ENCOUNTER — Ambulatory Visit (INDEPENDENT_AMBULATORY_CARE_PROVIDER_SITE_OTHER): Payer: PPO | Admitting: Family Medicine

## 2023-06-24 ENCOUNTER — Inpatient Hospital Stay: Payer: PPO | Admitting: Family Medicine

## 2023-06-24 ENCOUNTER — Encounter: Payer: Self-pay | Admitting: Family Medicine

## 2023-06-24 VITALS — BP 122/64 | HR 59 | Temp 97.8°F | Ht 66.0 in

## 2023-06-24 DIAGNOSIS — R600 Localized edema: Secondary | ICD-10-CM

## 2023-06-24 DIAGNOSIS — D500 Iron deficiency anemia secondary to blood loss (chronic): Secondary | ICD-10-CM | POA: Insufficient documentation

## 2023-06-24 DIAGNOSIS — L97929 Non-pressure chronic ulcer of unspecified part of left lower leg with unspecified severity: Secondary | ICD-10-CM | POA: Diagnosis not present

## 2023-06-24 DIAGNOSIS — L03116 Cellulitis of left lower limb: Secondary | ICD-10-CM | POA: Diagnosis not present

## 2023-06-24 DIAGNOSIS — K922 Gastrointestinal hemorrhage, unspecified: Secondary | ICD-10-CM | POA: Diagnosis not present

## 2023-06-24 DIAGNOSIS — K5520 Angiodysplasia of colon without hemorrhage: Secondary | ICD-10-CM | POA: Diagnosis not present

## 2023-06-24 DIAGNOSIS — D649 Anemia, unspecified: Secondary | ICD-10-CM | POA: Insufficient documentation

## 2023-06-24 DIAGNOSIS — I4821 Permanent atrial fibrillation: Secondary | ICD-10-CM

## 2023-06-24 DIAGNOSIS — R0602 Shortness of breath: Secondary | ICD-10-CM | POA: Insufficient documentation

## 2023-06-24 DIAGNOSIS — I83029 Varicose veins of left lower extremity with ulcer of unspecified site: Secondary | ICD-10-CM

## 2023-06-24 DIAGNOSIS — J90 Pleural effusion, not elsewhere classified: Secondary | ICD-10-CM | POA: Diagnosis not present

## 2023-06-24 DIAGNOSIS — J9811 Atelectasis: Secondary | ICD-10-CM | POA: Diagnosis not present

## 2023-06-24 DIAGNOSIS — C44729 Squamous cell carcinoma of skin of left lower limb, including hip: Secondary | ICD-10-CM

## 2023-06-24 DIAGNOSIS — N179 Acute kidney failure, unspecified: Secondary | ICD-10-CM

## 2023-06-24 DIAGNOSIS — N1832 Chronic kidney disease, stage 3b: Secondary | ICD-10-CM | POA: Diagnosis not present

## 2023-06-24 DIAGNOSIS — R627 Adult failure to thrive: Secondary | ICD-10-CM | POA: Diagnosis not present

## 2023-06-24 DIAGNOSIS — R0609 Other forms of dyspnea: Secondary | ICD-10-CM | POA: Diagnosis not present

## 2023-06-24 DIAGNOSIS — C259 Malignant neoplasm of pancreas, unspecified: Secondary | ICD-10-CM | POA: Diagnosis not present

## 2023-06-24 DIAGNOSIS — K921 Melena: Secondary | ICD-10-CM | POA: Diagnosis not present

## 2023-06-24 DIAGNOSIS — I83892 Varicose veins of left lower extremities with other complications: Secondary | ICD-10-CM | POA: Diagnosis not present

## 2023-06-24 DIAGNOSIS — D3A8 Other benign neuroendocrine tumors: Secondary | ICD-10-CM

## 2023-06-24 DIAGNOSIS — I5032 Chronic diastolic (congestive) heart failure: Secondary | ICD-10-CM

## 2023-06-24 DIAGNOSIS — I10 Essential (primary) hypertension: Secondary | ICD-10-CM | POA: Diagnosis not present

## 2023-06-24 DIAGNOSIS — R531 Weakness: Secondary | ICD-10-CM | POA: Diagnosis not present

## 2023-06-24 LAB — CBC WITH DIFFERENTIAL/PLATELET
Abs Immature Granulocytes: 0.02 10*3/uL (ref 0.00–0.07)
Absolute Lymphocytes: 290 {cells}/uL — ABNORMAL LOW (ref 850–3900)
Absolute Monocytes: 639 {cells}/uL (ref 200–950)
Basophils Absolute: 18 {cells}/uL (ref 0–200)
Basophils Absolute: 0 10*3/uL (ref 0.0–0.1)
Basophils Relative: 0.4 %
Basophils Relative: 1 %
Eosinophils Absolute: 9 {cells}/uL — ABNORMAL LOW (ref 15–500)
Eosinophils Absolute: 0 10*3/uL (ref 0.0–0.5)
Eosinophils Relative: 0.2 %
Eosinophils Relative: 0 %
HCT: 24.4 % — ABNORMAL LOW (ref 35.0–45.0)
HCT: 25.1 % — ABNORMAL LOW (ref 36.0–46.0)
Hemoglobin: 7.2 g/dL — ABNORMAL LOW (ref 11.7–15.5)
Hemoglobin: 7.2 g/dL — ABNORMAL LOW (ref 12.0–15.0)
Immature Granulocytes: 1 %
Lymphocytes Relative: 8 %
Lymphs Abs: 0.3 10*3/uL — ABNORMAL LOW (ref 0.7–4.0)
MCH: 24.9 pg — ABNORMAL LOW (ref 27.0–33.0)
MCH: 25.2 pg — ABNORMAL LOW (ref 26.0–34.0)
MCHC: 29.5 g/dL — ABNORMAL LOW (ref 32.0–36.0)
MCHC: 28.7 g/dL — ABNORMAL LOW (ref 30.0–36.0)
MCV: 84.4 fL (ref 80.0–100.0)
MCV: 87.8 fL (ref 80.0–100.0)
MPV: 10 fL (ref 7.5–12.5)
Monocytes Absolute: 0.6 10*3/uL (ref 0.1–1.0)
Monocytes Relative: 13.9 %
Monocytes Relative: 14 %
Neutro Abs: 3643 {cells}/uL (ref 1500–7800)
Neutro Abs: 3.4 10*3/uL (ref 1.7–7.7)
Neutrophils Relative %: 79.2 %
Neutrophils Relative %: 76 %
Platelets: 216 10*3/uL (ref 140–400)
Platelets: 193 10*3/uL (ref 150–400)
RBC: 2.89 10*6/uL — ABNORMAL LOW (ref 3.80–5.10)
RBC: 2.86 MIL/uL — ABNORMAL LOW (ref 3.87–5.11)
RDW: 19.6 % — ABNORMAL HIGH (ref 11.0–15.0)
RDW: 22.1 % — ABNORMAL HIGH (ref 11.5–15.5)
Smear Review: NORMAL
Total Lymphocyte: 6.3 %
WBC: 4.6 10*3/uL (ref 3.8–10.8)
WBC: 4.4 10*3/uL (ref 4.0–10.5)
nRBC: 0 % (ref 0.0–0.2)

## 2023-06-24 LAB — BRAIN NATRIURETIC PEPTIDE: B Natriuretic Peptide: 516.7 pg/mL — ABNORMAL HIGH (ref 0.0–100.0)

## 2023-06-24 LAB — RENAL FUNCTION PANEL
Albumin: 3.7 g/dL (ref 3.6–5.1)
BUN/Creatinine Ratio: 30 (calc) — ABNORMAL HIGH (ref 6–22)
BUN: 63 mg/dL — ABNORMAL HIGH (ref 7–25)
CO2: 32 mmol/L (ref 20–32)
Calcium: 8.7 mg/dL (ref 8.6–10.4)
Chloride: 92 mmol/L — ABNORMAL LOW (ref 98–110)
Creat: 2.12 mg/dL — ABNORMAL HIGH (ref 0.60–0.95)
Glucose, Bld: 83 mg/dL (ref 65–99)
Phosphorus: 3.9 mg/dL (ref 2.1–4.3)
Potassium: 3.9 mmol/L (ref 3.5–5.3)
Sodium: 137 mmol/L (ref 135–146)

## 2023-06-24 LAB — COMPREHENSIVE METABOLIC PANEL
ALT: 13 U/L (ref 0–44)
AST: 19 U/L (ref 15–41)
Albumin: 3.5 g/dL (ref 3.5–5.0)
Alkaline Phosphatase: 58 U/L (ref 38–126)
Anion gap: 10 (ref 5–15)
BUN: 63 mg/dL — ABNORMAL HIGH (ref 8–23)
CO2: 34 mmol/L — ABNORMAL HIGH (ref 22–32)
Calcium: 9.2 mg/dL (ref 8.9–10.3)
Chloride: 92 mmol/L — ABNORMAL LOW (ref 98–111)
Creatinine, Ser: 1.86 mg/dL — ABNORMAL HIGH (ref 0.44–1.00)
GFR, Estimated: 26 mL/min — ABNORMAL LOW (ref >60)
Glucose, Bld: 121 mg/dL — ABNORMAL HIGH (ref 70–99)
Potassium: 3.5 mmol/L (ref 3.5–5.1)
Sodium: 136 mmol/L (ref 135–145)
Total Bilirubin: 0.7 mg/dL (ref 0.0–1.2)
Total Protein: 7.3 g/dL (ref 6.5–8.1)

## 2023-06-24 LAB — TROPONIN I (HIGH SENSITIVITY): Troponin I (High Sensitivity): 34 ng/L — ABNORMAL HIGH (ref <18)

## 2023-06-24 MED ORDER — DIPHENHYDRAMINE HCL 25 MG PO CAPS
50.0000 mg | ORAL_CAPSULE | Freq: Once | ORAL | Status: AC
  Administered 2023-06-24: 50 mg via ORAL
  Filled 2023-06-24: qty 2

## 2023-06-24 MED ORDER — ACETAMINOPHEN 500 MG PO TABS
1000.0000 mg | ORAL_TABLET | Freq: Once | ORAL | Status: AC
  Administered 2023-06-24: 1000 mg via ORAL
  Filled 2023-06-24: qty 2

## 2023-06-24 MED ORDER — METHYLPREDNISOLONE SODIUM SUCC 40 MG IJ SOLR
40.0000 mg | Freq: Once | INTRAMUSCULAR | Status: AC
  Administered 2023-06-24: 40 mg via INTRAVENOUS
  Filled 2023-06-24: qty 1

## 2023-06-24 MED ORDER — SODIUM CHLORIDE 0.9 % IV SOLN
10.0000 mL/h | Freq: Once | INTRAVENOUS | Status: AC
  Administered 2023-06-25: 10 mL/h via INTRAVENOUS

## 2023-06-24 MED ORDER — DIPHENHYDRAMINE HCL 50 MG/ML IJ SOLN: 50.0000 mg | Freq: Once | INTRAMUSCULAR | Status: AC

## 2023-06-24 MED ORDER — FAMOTIDINE IN NACL 20-0.9 MG/50ML-% IV SOLN
20.0000 mg | Freq: Once | INTRAVENOUS | Status: AC
  Administered 2023-06-24: 20 mg via INTRAVENOUS
  Filled 2023-06-24: qty 50

## 2023-06-24 NOTE — Assessment & Plan Note (Addendum)
Completed levaquin oral antibiotic course for cellulitis with improvement.  Leg re-wrapped. Continue dressing change Q2d.

## 2023-06-24 NOTE — ED Provider Notes (Signed)
[facility] Provider Note   Event Date/Time   First MD Initiated Contact with Patient 06/24/23 2058     (approximate) History  Weakness  HPI SHERRY BLACKARD is a 89 y.o. female with a stated past medical history of chronic GI bleeding secondary to pancreatic cancer who presents complaining of worsening anemia and shortness of breath with dyspnea on exertion.  Patient states that she has felt similar in the past when she has needed transfusions.  Patient states that she gets multiple infusions via hematology/oncology however she has been unable to receive these infusions until very recently.  Patient states that she has follow-up within the next week with her hematology oncology group to receive these transfusions again. ROS: Patient currently denies any vision changes, tinnitus, difficulty speaking, facial droop, sore throat, chest pain, shortness of breath, abdominal pain, nausea/vomiting/diarrhea, dysuria, or numbness/paresthesias in any extremity   Physical Exam  Triage Vital Signs: ED Triage Vitals  Encounter Vitals Group     BP 06/24/23 1608 (!) 124/59     Systolic BP Percentile --      Diastolic BP Percentile --      Pulse Rate 06/24/23 1608 60     Resp 06/24/23 1608 18     Temp 06/24/23 1608 98.1 F (36.7 C)     Temp Source 06/24/23 1608 Oral     SpO2 06/24/23 1608 100 %     Weight 06/24/23 1606 140 lb (63.5 kg)     Height 06/24/23 1606 5\' 6"  (1.676 m)     Head Circumference --      Peak Flow --      Pain Score --      Pain Loc --      Pain Education --      Exclude from Growth Chart --    Most recent vital signs: Vitals:   06/24/23 1608 06/24/23 2105  BP: (!) 124/59 138/76  Pulse: 60 (!) 58  Resp: 18 20  Temp: 98.1 F (36.7 C) 98.2 F (36.8 C)  SpO2: 100% 100%   General: Awake, oriented x4. CV:  Good peripheral perfusion.  Resp:  Normal effort.  Abd:  No distention.  Other:  Elderly well-developed, well-nourished Caucasian female  resting comfortably in no acute distress ED Results / Procedures / Treatments  Labs (all labs ordered are listed, but only abnormal results are displayed) Labs Reviewed  COMPREHENSIVE METABOLIC PANEL - Abnormal; Notable for the following components:      Result Value   Chloride 92 (*)    CO2 34 (*)    Glucose, Bld 121 (*)    BUN 63 (*)    Creatinine, Ser 1.86 (*)    GFR, Estimated 26 (*)    All other components within normal limits  CBC WITH DIFFERENTIAL/PLATELET - Abnormal; Notable for the following components:   RBC 2.86 (*)    Hemoglobin 7.2 (*)    HCT 25.1 (*)    MCH 25.2 (*)    MCHC 28.7 (*)    RDW 22.1 (*)    Lymphs Abs 0.3 (*)    All other components within normal limits  BRAIN NATRIURETIC PEPTIDE - Abnormal; Notable for the following components:   B Natriuretic Peptide 516.7 (*)    All other components within normal limits  TROPONIN I (HIGH SENSITIVITY) - Abnormal; Notable for the following components:   Troponin I (High Sensitivity) 34 (*)    All other components within normal limits  TYPE AND SCREEN  PREPARE RBC (CROSSMATCH)  TROPONIN I (HIGH SENSITIVITY)   EKG ED ECG REPORT I, Merwyn Katos, the attending physician, personally viewed and interpreted this ECG. Date: 06/24/2023 EKG Time: 1618 Rate: 61 Rhythm: Ventricularly paced rhythm QRS Axis: normal Intervals: normal ST/T Wave abnormalities: normal Narrative Interpretation: no evidence of acute ischemia RADIOLOGY ED MD interpretation: 2 view chest x-ray shows minimal basilar subsegmental atelectasis with small pleural effusions -Agree with radiology assessment Official radiology report(s): DG Chest 2 View Result Date: 06/24/2023 CLINICAL DATA:  Weakness. EXAM: CHEST - 2 VIEW COMPARISON:  June 16, 2023. FINDINGS: Stable cardiomediastinal silhouette. Left-sided defibrillator is unchanged. Minimal bibasilar subsegmental atelectasis is noted with small pleural effusions. Bony thorax is unremarkable.  IMPRESSION: Minimal bibasilar subsegmental atelectasis with small pleural effusions. Electronically Signed   By: Lupita Raider M.D.   On: 06/24/2023 18:15   PROCEDURES: Critical Care performed: Yes, see critical care procedure note(s) Procedures MEDICATIONS ORDERED IN ED: Medications  0.9 %  sodium chloride infusion (has no administration in time range)  methylPREDNISolone sodium succinate (SOLU-MEDROL) 40 mg/mL injection 40 mg (has no administration in time range)  diphenhydrAMINE (BENADRYL) capsule 50 mg (has no administration in time range)    Or  diphenhydrAMINE (BENADRYL) injection 50 mg (has no administration in time range)  famotidine (PEPCID) IVPB 20 mg premix (has no administration in time range)  acetaminophen (TYLENOL) tablet 1,000 mg (has no administration in time range)   IMPRESSION / MDM / ASSESSMENT AND PLAN / ED COURSE  I reviewed the triage vital signs and the nursing notes.                             The patient is on the cardiac monitor to evaluate for evidence of arrhythmia and/or significant heart rate changes. Patient's presentation is most consistent with acute presentation with potential threat to life or bodily function. History of chronic upper GI bleeding secondary to pancreatic cancer.  Patient states that she has had multiple endoscopies and there is not anything more to be done. + black stool per rectum Given history and exam patient's presentation most consistent with upper GI bleed possibly secondary to peptic ulcer disease or variceal bleeding. I have low suspicion for aortoenteric fistula, ENT bleeding mimic, Boerhaave's, Pulmonary bleeding mimic.  Workup: CBC, BMP, LFTs, Lipase, PT/INR, Type and Screen  Interventions: Analgesia and antiemetic medications PRN Pretreatment secondary to transfusion reaction in the past PRBC transfusion  Findings: Hb: 7.1  Disposition: Discharge after transfusion   FINAL CLINICAL IMPRESSION(S) / ED DIAGNOSES    Final diagnoses:  Chronic upper GI bleeding  Anemia, unspecified type   Rx / DC Orders   ED Discharge Orders     None      Note:  This document was prepared using Dragon voice recognition software and may include unintentional dictation errors.   Merwyn Katos, MD 06/24/23 [phone]

## 2023-06-24 NOTE — Assessment & Plan Note (Signed)
Eliquis remains on hold. Continue ppi Now on VEGF inhibitor Bevacizumab.

## 2023-06-24 NOTE — Assessment & Plan Note (Addendum)
Progressive deterioration from baseline Cr 1.5 to 2.2 on latest check anticipate due to recent increased diuretic and progressive anemia. She has been taking torsemide 100mg  bid and metolazone 2.5mg  twice weekly.  Tenuous situation

## 2023-06-24 NOTE — Assessment & Plan Note (Signed)
Hasn't received octreotide recently.  Not primary issue.

## 2023-06-24 NOTE — Assessment & Plan Note (Addendum)
Progressive, symptomatic.  Hgb dropped from 8.7 after 1u pRBC down to 7.2 on labs yesterday. She notes ongoing dark stools concern for ongoing GI bleed, not candidate for treatment from GI standpoint.  She has not been able to receive bevacizumab since ~05/2023. Touched base with heme/oncology - agree with ER eval for worsening anemia and ARF, she will plan to keep Monday appt with onc for next Bevacizumab treatment.

## 2023-06-24 NOTE — Assessment & Plan Note (Addendum)
Discussed tenuous situation with patient and niece [name].  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  She notes she is tiring out from repeated treatments however still desires to seek aggressive care, is not ready to transition to hospice care.

## 2023-06-24 NOTE — Assessment & Plan Note (Addendum)
This is significantly improved after levaquin course.  Needs ongoing pedal edema and venous ulcer care L>R.  Continue dressing wraps regularly.

## 2023-06-24 NOTE — Progress Notes (Addendum)
Ph: [phone] Fax: [phone]   Patient ID: [name], female    DOB: 16-Jul-1933, 89 y.o.   MRN: [id number]  This visit was conducted in person.  BP 122/64   Pulse (!) 59   Temp 97.8 F (36.6 C) (Oral)   Ht 5\' 6"  (1.676 m)   SpO2 98% Comment: 3 L  BMI 22.60 kg/m    CC: hosp f/u visit  Subjective:   HPI: [name] is a 89 y.o. female presenting on 06/24/2023 for Hospitalization Follow-up (Admitted on 06/13/23 at [facility], dx cellulitis of LLE; CHF. Pt accompanied by niece, [name]. 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   Last seen in office 02/2023.  Since then hospitalized for CHF exacerbation 05/2023 followed by LLE cellulitis 06/2023.  Hospital records reviewed. Med rec performed.  Discharged to complete oral levaquin course for LLE cellulitis.   CHF - currently on torsemide 100mg  BID and metolazone 25mg  twice weekly.   S/p 1u pRBC transfusion while hospitalized  with increase in Hgb from 7.0 to 8.6.  They note ongoing dark stools present for at least the past week, ongoing loose dark stools x6 overnight.  Notes chest pain, dyspnea, lightheadedness, abd pain.   Other follow up appointments scheduled: heme/oncology Dr Mosetta Putt 06/30/2023, CHF clinic Dr Shirlee Latch 07/10/2023. .   ______________________________________________________________________ Hospital admission: 06/13/2023 Hospital discharge: 06/20/2023 TCM f/u phone call:  performed on 06/23/2023  Recommendations at discharge:  Follow-up with outpatient providers as requested   Discharge Diagnoses: Active Problems:   Cellulitis of left lower extremity   Acute on chronic diastolic CHF (congestive heart failure) (HCC)   Dyslipidemia   GERD without esophagitis   Venous stasis ulcer of left lower leg with edema of left lower leg (HCC)   Congestive heart failure (HCC)     Relevant past medical, surgical, family and social history reviewed and updated as indicated. Interim medical history since our last visit reviewed. Allergies and medications reviewed and updated. Outpatient Medications Prior to Visit  Medication Sig Dispense Refill   acetaminophen (TYLENOL) 500 MG tablet Take 1,000 mg by mouth every 8 (eight) hours.     Ascorbic Acid (VITAMIN C PO) Take 1 tablet by mouth daily.     Cholecalciferol (VITAMIN D-3 PO) Take 2 capsules by mouth daily.     CRANBERRY PO Take 1 tablet by mouth daily.     Cyanocobalamin (B-12 PO) Take 1 tablet by mouth daily.     ferrous gluconate (FERGON) 324 MG tablet Take 1 tablet (324 mg total) by mouth daily with breakfast. 30 tablet 3   fluticasone (FLONASE) 50 MCG/ACT nasal spray Place 2 sprays into both nostrils daily as needed for allergies or rhinitis. 16 g 0   metolazone (ZAROXOLYN) 2.5 MG tablet Take 1 tablet (2.5 mg total) by mouth once a week. Every Tuesday with 40 meq of potassium 90 tablet 3   Multiple Vitamins-Minerals (HAIR SKIN NAILS PO) Take 1 capsule by mouth daily.  pantoprazole (PROTONIX) 40 MG tablet Take 1 tablet (40 mg total) by mouth daily. (Patient taking differently: Take 40 mg by mouth daily before breakfast.) 30 tablet 3   rosuvastatin (CRESTOR) 10 MG tablet Take 1 tablet (10 mg total) by mouth daily. 30 tablet 0   torsemide (DEMADEX) 20 MG tablet  Take 5 tablets (100 mg total) by mouth 2 (two) times daily.     levofloxacin (LEVAQUIN) 250 MG tablet Take 1 tablet (250 mg total) by mouth daily for 4 days. 4 tablet 0   No facility-administered medications prior to visit.     Per HPI unless specifically indicated in ROS section below Review of Systems  Objective:  BP 122/64   Pulse (!) 59   Temp 97.8 F (36.6 C) (Oral)   Ht 5\' 6"  (1.676 m)   SpO2 98% Comment: 3 L  BMI 22.60 kg/m   Wt Readings from Last 3 Encounters:  06/14/23 140 lb (63.5 kg)  06/13/23 141 lb 6.4 oz (64.1 kg)  06/09/23 141 lb (64 kg)      Physical Exam Vitals and nursing note reviewed.  Constitutional:      Appearance: Normal appearance. She is ill-appearing.  HENT:     Mouth/Throat:     Comments: Wearing mask Cardiovascular:     Rate and Rhythm: Normal rate. Rhythm irregular.     Pulses: Normal pulses.     Heart sounds: No murmur heard. Pulmonary:     Effort: Pulmonary effort is normal. No respiratory distress.     Breath sounds: Normal breath sounds. No wheezing, rhonchi or rales.  Abdominal:     General: Bowel sounds are normal.     Palpations: Abdomen is soft.     Tenderness: There is abdominal tenderness in the epigastric area and left upper quadrant.  Musculoskeletal:     Right lower leg: Edema (tr) present.     Left lower leg: Edema (tr) present.     Comments: Legs undressed and rewrapped  No weeping on right Open sore to left anterior leg  Skin:    General: Skin is warm and dry.     Findings: No rash.     Comments: Dry skin with scabbing to bilateral lower extremities  Neurological:     Mental Status: She is alert.       Results for orders placed or performed in visit on 06/23/23  CBC with Differential/Platelet   Collection Time: 06/23/23  3:18 PM  Result Value Ref Range   WBC 4.6 3.8 - 10.8 Thousand/uL   RBC 2.89 (L) 3.80 - 5.10 Million/uL   Hemoglobin 7.2 (L) 11.7 - 15.5 g/dL   HCT 82.9 (L) 56.2 - 13.0 %   MCV 84.4 80.0 -  100.0 fL   MCH 24.9 (L) 27.0 - 33.0 pg   MCHC 29.5 (L) 32.0 - 36.0 g/dL   RDW 86.5 (H) 78.4 - 69.6 %   Platelets 216 140 - 400 Thousand/uL   MPV 10.0 7.5 - 12.5 fL   Neutro Abs 3,643 1,500 - 7,800 cells/uL   Absolute Lymphocytes 290 (L) 850 - 3,900 cells/uL   Absolute Monocytes 639 200 - 950 cells/uL   Eosinophils Absolute 9 (L) 15 - 500 cells/uL   Basophils Absolute 18 0 - 200 cells/uL   Neutrophils Relative % 79.2 %   Total Lymphocyte 6.3 %   Monocytes Relative 13.9 %   Eosinophils Relative 0.2 %   Basophils Relative 0.4 %  Renal function panel   Collection Time: 06/23/23  3:18 PM  Result Value Ref Range   Glucose, Bld 83 65 - 99 mg/dL   BUN 63 (H) 7 - 25 mg/dL   Creat 0.98 (H) 1.19 - 0.95 mg/dL   BUN/Creatinine Ratio 30 (H) 6 - 22 (calc)   Sodium 137 135 - 146 mmol/L   Potassium 3.9 3.5 - 5.3 mmol/L   Chloride 92 (L) 98 - 110 mmol/L   CO2 32 20 - 32 mmol/L   Calcium 8.7 8.6 - 10.4 mg/dL   Phosphorus 3.9 2.1 - 4.3 mg/dL   Albumin 3.7 3.6 - 5.1 g/dL   *Note: Due to a large number of results and/or encounters for the requested time period, some results have not been displayed. A complete set of results can be found in Results Review.    Assessment & Plan:  I spent 40 minutes caring for this patient today face to face, reviewing labs, prior records from another facility, performing a medically appropriate examination and/or evaluation, counseling and educating the patient on health issues, dressing leg wounds, documenting in the record and arranging for follow up/ER return/dicussed with onc Dr Mosetta Putt.  Problem List Items Addressed This Visit     Atrial fibrillation, permanent (HCC)   She is currently off anticoagulation       Acute on chronic kidney failure (HCC)   Progressive deterioration from baseline Cr 1.5 to 2.2 on latest check anticipate due to recent increased diuretic and progressive anemia. She has been taking torsemide 100mg  bid and metolazone 2.5mg  twice weekly.   Tenuous situation       Chronic diastolic CHF (congestive heart failure) (HCC)   Regularly seeing advanced CHF clinic Shirlee Latch).  Seems euvolemic to dry as per above.  May need gentle IV fluid rehydration and/or blood transfusion      Chronic blood loss anemia   Progressive, symptomatic.  Hgb dropped from 8.7 after 1u pRBC down to 7.2 on labs yesterday. She notes ongoing dark stools concern for ongoing GI bleed, not candidate for treatment from GI standpoint.  She has not been able to receive bevacizumab since ~05/2023. Touched base with heme/oncology - agree with ER eval for worsening anemia and ARF, she will plan to keep Monday appt with onc for next Bevacizumab treatment.       Black stools - Primary   Concern for ongoing upper GI bleed with worsening anemia despite transfusion 1u 06/20/2023 and worsening kidney function. Not candidate for GI treatment.  Next bevacizumab infusion is not scheduled until next Monday.  Concern for return home in setting of upcoming snow storm.  Touched base with heme/onc, agree with ER eval for possible blood transfusion, gentle rehydration, hopeful for short hospital stay then keep heme/onc appt Monday.       Angiodysplasia of small intestine   Eliquis remains on hold. Continue ppi Now on VEGF inhibitor Bevacizumab.       Squamous cell carcinoma of foot, left   Needs to f/u with derm for possible Mohs surgery       Primary pancreatic neuroendocrine tumor   Hasn't received octreotide recently.  Not primary issue.       Cellulitis of left lower extremity   This is significantly improved after levaquin course.  Needs ongoing pedal edema and venous ulcer care L>R.  Continue dressing wraps regularly.       Venous stasis ulcer of left lower leg with edema of left lower leg (HCC)   Completed levaquin oral antibiotic course for cellulitis with improvement.  Leg  re-wrapped. Continue dressing change Q2d.       FTT (failure to thrive) in adult    Discussed tenuous situation with patient and niece [name].  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  She notes she is tiring out from repeated treatments however still desires to seek aggressive care, is not ready to transition to hospice care.         No orders of the defined types were placed in this encounter.   No orders of the defined types were placed in this encounter.   Patient Instructions  Anemia is worse with ongoing dark stools  Kidney function is worse I recommend going back to ER for further evaluation.   Follow up plan: No follow-ups on file.  [name], MD

## 2023-06-24 NOTE — ED Triage Notes (Signed)
Pt's niece brought her in with c/o increased weakness, vomiting, dizziness and continues to have black tarry stools. She was recently discharged from here on 2/14 and received a blood transfusion on Friday. She has a hx of pancreatic cancer and heart failure.

## 2023-06-24 NOTE — Assessment & Plan Note (Signed)
Needs to f/u with derm for possible Mohs surgery

## 2023-06-24 NOTE — Patient Instructions (Addendum)
Anemia is worse with ongoing dark stools  Kidney function is worse I recommend going back to ER for further evaluation.

## 2023-06-24 NOTE — Assessment & Plan Note (Addendum)
Regularly seeing advanced CHF clinic Shirlee Latch).  Seems euvolemic to dry as per above.  May need gentle IV fluid rehydration and/or blood transfusion

## 2023-06-24 NOTE — Assessment & Plan Note (Signed)
She is currently off anticoagulation

## 2023-06-24 NOTE — ED Provider Triage Note (Signed)
Emergency Medicine Provider Triage Evaluation Note  [name] , a 89 y.o. female  was evaluated in triage.  Pt complains of weakness, vomiting, dark stools.  Patient was recently admitted and d/c on 2/14, needed a blood transfusion while in the hospital. Last hgb was in the 7s. Hx of pancreatic cancer that is inoperable.   Patient was sent by her doctor for a potential transfusion.  Review of Systems  Positive: See above Negative:   Physical Exam  There were no vitals taken for this visit. Gen:   Awake, no distress   Resp:  Normal effort  MSK:   Moves extremities without difficulty  Other:    Medical Decision Making  Medically screening exam initiated at 4:03 PM.  Appropriate orders placed.  [name] was informed that the remainder of the evaluation will be completed by another provider, this initial triage assessment does not replace that evaluation, and the importance of remaining in the ED until their evaluation is complete.     Cameron Ali, PA-C 06/24/23 1607

## 2023-06-24 NOTE — Discharge Instructions (Addendum)
Return to the ER for worsening symptoms, persistent vomiting, difficulty breathing or other concerns. °

## 2023-06-24 NOTE — Assessment & Plan Note (Addendum)
Concern for ongoing upper GI bleed with worsening anemia despite transfusion 1u 06/20/2023 and worsening kidney function. Not candidate for GI treatment.  Next bevacizumab infusion is not scheduled until next Monday.  Concern for return home in setting of upcoming snow storm.  Touched base with heme/onc, agree with ER eval for possible blood transfusion, gentle rehydration, hopeful for short hospital stay then keep heme/onc appt Monday.

## 2023-06-25 ENCOUNTER — Ambulatory Visit: Payer: PPO

## 2023-06-25 ENCOUNTER — Other Ambulatory Visit: Payer: PPO

## 2023-06-25 ENCOUNTER — Ambulatory Visit: Payer: PPO | Admitting: Hematology

## 2023-06-25 DIAGNOSIS — S81801A Unspecified open wound, right lower leg, initial encounter: Secondary | ICD-10-CM | POA: Diagnosis not present

## 2023-06-25 DIAGNOSIS — L97811 Non-pressure chronic ulcer of other part of right lower leg limited to breakdown of skin: Secondary | ICD-10-CM | POA: Diagnosis not present

## 2023-06-25 DIAGNOSIS — I87311 Chronic venous hypertension (idiopathic) with ulcer of right lower extremity: Secondary | ICD-10-CM | POA: Diagnosis not present

## 2023-06-25 LAB — PREPARE RBC (CROSSMATCH)

## 2023-06-25 NOTE — ED Provider Notes (Signed)
-----------------------------------------   3:37 AM on 06/25/2023 -----------------------------------------   Transfusion completed.  No reaction.  Patient will be discharged home with close follow-up with her heme-onc doctor.  Strict return precautions given.  Patient verbalizes understanding and agrees with plan of care.   Irean Hong, MD 06/25/23 [phone]

## 2023-06-26 LAB — BPAM RBC
Blood Product Expiration Date: 202503092359
Blood Product Expiration Date: 202503182359
Blood Product Expiration Date: 202503182359
ISSUE DATE / TIME: 202502190002
Unit Type and Rh: 600
Unit Type and Rh: 9500
Unit Type and Rh: 9500

## 2023-06-26 LAB — TYPE AND SCREEN
ABO/RH(D): A NEG
Antibody Screen: POSITIVE
Unit division: 0
Unit division: 0
Unit division: 0

## 2023-06-26 NOTE — Assessment & Plan Note (Signed)
 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   -Outpatient EUS 03/04/2022 by Dr. 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 Freida Busman did not recommend surgery in multifocal NET and also due to her age and co-morbidities. -She began first line sandostatin injections q28 days on 03/27/22.  -Dotatate PET scan, done 2 weeks after first injection 04/10/22, shows no significant tracer avidity in the pancrease or distant sites. Findings are likely due to close timing relative to the sandostatin injection blocking receptors. When we repeat this for restaging next year, will arrange 4 weeks after sandostatin -She has persistent anemia, likely secondary to GI bleeding from the tumor and AVM, she has had multiple hospital admission for anemia and bleeding.

## 2023-06-26 NOTE — Assessment & Plan Note (Signed)
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 -She previously had reaction to blood transfusion, or required multiple premedications for blood transfusion -She was recently hospitalized for anemia and a GI bleeding in July 2024. -She is currently on ESA every 2 weeks, IV iron as needed, and blood transfusion as needed. -Due to her multiple hospital admission for anemia and GI bleeding, she started bevacizumab for AVM on 02/12/2023

## 2023-06-27 ENCOUNTER — Telehealth: Payer: Self-pay

## 2023-06-27 NOTE — Telephone Encounter (Signed)
Transition Care Management Follow-up Telephone Call Date of discharge and from where: 06/25/23 [facility] How have you been since you were released from the hospital? [facility] Any questions or concerns? No  Items Reviewed: Did the pt receive and understand the discharge instructions provided? Yes  Medications obtained and verified? Yes  Other? No  Any new allergies since your discharge? No  Dietary orders reviewed? No Do you have support at home? Yes   Home Care and Equipment/Supplies: Were home health services ordered? no If so, what is the name of the agency?   Has the agency set up a time to come to the patient's home? not applicable Were any new equipment or medical supplies ordered?  No What is the name of the medical supply agency?  Were you able to get the supplies/equipment? not applicable Do you have any questions related to the use of the equipment or supplies? No  Functional Questionnaire: (I = Independent and D = Dependent) ADLs: I     Bathing/Dressing- I   Meal Prep- I  Eating- I  Maintaining continence- I  Transferring/Ambulation- I  Managing Meds- I  Follow up appointments reviewed:  PCP Hospital f/u appt confirmed? Yes  Scheduled to see CANCER MD on MONDAY, THEN SHE "WILL DECIDE WHAT TO DO FROM THERE" Specialist Hospital f/u appt confirmed? No  Scheduled to see   on  @ . Are transportation arrangements needed? No  If their condition worsens, is the pt aware to call PCP or go to the Emergency Dept.? Yes Was the patient provided with contact information for the PCP's office or ED? Yes Was to pt encouraged to call back with questions or concerns? Yes

## 2023-06-29 DIAGNOSIS — J449 Chronic obstructive pulmonary disease, unspecified: Secondary | ICD-10-CM | POA: Diagnosis not present

## 2023-06-29 DIAGNOSIS — I5033 Acute on chronic diastolic (congestive) heart failure: Secondary | ICD-10-CM | POA: Diagnosis not present

## 2023-06-30 ENCOUNTER — Inpatient Hospital Stay: Payer: PPO

## 2023-06-30 ENCOUNTER — Ambulatory Visit: Payer: PPO

## 2023-06-30 ENCOUNTER — Inpatient Hospital Stay: Payer: PPO | Attending: Nurse Practitioner

## 2023-06-30 ENCOUNTER — Other Ambulatory Visit: Payer: PPO

## 2023-06-30 ENCOUNTER — Inpatient Hospital Stay (HOSPITAL_BASED_OUTPATIENT_CLINIC_OR_DEPARTMENT_OTHER): Payer: PPO | Admitting: Hematology

## 2023-06-30 ENCOUNTER — Ambulatory Visit: Payer: PPO | Admitting: Hematology

## 2023-06-30 ENCOUNTER — Encounter: Payer: Self-pay | Admitting: Hematology

## 2023-06-30 VITALS — BP 123/52 | HR 60 | Temp 98.1°F | Resp 15 | Wt 132.8 lb

## 2023-06-30 VITALS — BP 116/53 | HR 59 | Resp 18

## 2023-06-30 DIAGNOSIS — Z79899 Other long term (current) drug therapy: Secondary | ICD-10-CM | POA: Diagnosis not present

## 2023-06-30 DIAGNOSIS — K5520 Angiodysplasia of colon without hemorrhage: Secondary | ICD-10-CM

## 2023-06-30 DIAGNOSIS — D3A8 Other benign neuroendocrine tumors: Secondary | ICD-10-CM | POA: Insufficient documentation

## 2023-06-30 DIAGNOSIS — D631 Anemia in chronic kidney disease: Secondary | ICD-10-CM | POA: Insufficient documentation

## 2023-06-30 DIAGNOSIS — D500 Iron deficiency anemia secondary to blood loss (chronic): Secondary | ICD-10-CM | POA: Diagnosis not present

## 2023-06-30 DIAGNOSIS — N1832 Chronic kidney disease, stage 3b: Secondary | ICD-10-CM | POA: Diagnosis not present

## 2023-06-30 DIAGNOSIS — D649 Anemia, unspecified: Secondary | ICD-10-CM

## 2023-06-30 LAB — CBC WITH DIFFERENTIAL (CANCER CENTER ONLY)
Abs Immature Granulocytes: 0.02 10*3/uL (ref 0.00–0.07)
Basophils Absolute: 0 10*3/uL (ref 0.0–0.1)
Basophils Relative: 1 %
Eosinophils Absolute: 0 10*3/uL (ref 0.0–0.5)
Eosinophils Relative: 0 %
HCT: 29.7 % — ABNORMAL LOW (ref 36.0–46.0)
Hemoglobin: 8.6 g/dL — ABNORMAL LOW (ref 12.0–15.0)
Immature Granulocytes: 1 %
Lymphocytes Relative: 11 %
Lymphs Abs: 0.5 10*3/uL — ABNORMAL LOW (ref 0.7–4.0)
MCH: 25.8 pg — ABNORMAL LOW (ref 26.0–34.0)
MCHC: 29 g/dL — ABNORMAL LOW (ref 30.0–36.0)
MCV: 89.2 fL (ref 80.0–100.0)
Monocytes Absolute: 0.6 10*3/uL (ref 0.1–1.0)
Monocytes Relative: 15 %
Neutro Abs: 3.1 10*3/uL (ref 1.7–7.7)
Neutrophils Relative %: 72 %
Platelet Count: 191 10*3/uL (ref 150–400)
RBC: 3.33 MIL/uL — ABNORMAL LOW (ref 3.87–5.11)
RDW: 22.4 % — ABNORMAL HIGH (ref 11.5–15.5)
WBC Count: 4.3 10*3/uL (ref 4.0–10.5)
nRBC: 0 % (ref 0.0–0.2)

## 2023-06-30 LAB — SAMPLE TO BLOOD BANK

## 2023-06-30 LAB — TOTAL PROTEIN, URINE DIPSTICK: Protein, ur: NEGATIVE mg/dL

## 2023-06-30 MED ORDER — OCTREOTIDE ACETATE 20 MG IM KIT
20.0000 mg | PACK | Freq: Once | INTRAMUSCULAR | Status: AC
Start: 2023-06-30 — End: 2023-06-30
  Administered 2023-06-30: 20 mg via INTRAMUSCULAR
  Filled 2023-06-30: qty 1

## 2023-06-30 MED ORDER — EPOETIN ALFA-EPBX 40000 UNIT/ML IJ SOLN
40000.0000 [IU] | Freq: Once | INTRAMUSCULAR | Status: AC
  Administered 2023-06-30: 40000 [IU] via SUBCUTANEOUS
  Filled 2023-06-30: qty 1

## 2023-06-30 MED ORDER — SODIUM CHLORIDE 0.9 % IV SOLN: Freq: Once | INTRAVENOUS | Status: AC

## 2023-06-30 MED ORDER — SODIUM CHLORIDE 0.9 % IV SOLN
5.0000 mg/kg | Freq: Once | INTRAVENOUS | Status: AC
  Administered 2023-06-30: 300 mg via INTRAVENOUS
  Filled 2023-06-30: qty 12

## 2023-06-30 MED ORDER — EPOETIN ALFA-EPBX 20000 UNIT/ML IJ SOLN: 20000.0000 [IU] | Freq: Once | INTRAMUSCULAR | Status: DC

## 2023-06-30 NOTE — Progress Notes (Signed)
 [facility]   [phone] [phone]   Clinic Follow up Note   Patient Care Team: [name], MD as PCP - General (Family Medicine) Laurey Morale, MD as PCP - Advanced Heart Failure (Cardiology) Antonieta Iba, MD as PCP - Cardiology (Cardiology) Duke Salvia, MD as PCP - Electrophysiology (Cardiology) Antonieta Iba, MD as Consulting Physician (Cardiology) Duke Salvia, MD as Consulting Physician (Cardiology) [name], Latina Craver, MD as Consulting Physician (Vascular Surgery) Creig Hines, MD as Consulting Physician (Hematology and Oncology) Malachy Mood, MD as Consulting Physician (Oncology) Kathyrn Sheriff, [facility] (Inactive) as Pharmacist (Pharmacist) Otho Ket, RN as [facility] Care Management  Date of Service:  06/30/2023  CHIEF COMPLAINT: f/u of anemia and neuroendocrine tumor  CURRENT THERAPY:  Bevacizumab every 2 weeks Retacrit every 2 weeks Sandostatin injection every 4 weeks  Oncology History   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   -Outpatient EUS 03/04/2022 by Dr. 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 Freida Busman did not recommend surgery in multifocal NET and also due to her age and co-morbidities. -She began first line sandostatin injections q28 days on 03/27/22.  -Dotatate PET scan, done 2 weeks after first injection 04/10/22, shows no significant tracer avidity in the pancrease or distant sites. Findings are likely due to close timing  relative to the sandostatin injection blocking receptors. When we repeat this for restaging next year, will arrange 4 weeks after sandostatin -She has persistent anemia, likely secondary to GI bleeding from the tumor and AVM, she has had multiple hospital admission for anemia and bleeding.     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 -She previously had reaction to blood transfusion, or required multiple premedications for blood transfusion -She was recently hospitalized for anemia and a GI bleeding in July 2024. -She is currently on ESA every 2 weeks, IV iron as needed, and blood transfusion as needed. -Due to her multiple hospital admission for anemia and GI bleeding, she started bevacizumab for AVM on 02/12/2023    Assessment and Plan    Neuroendocrine Tumor Ongoing treatment with bevacizumab. Missed recent doses due to hospitalizations. Scheduled for infusion today. Explained that bevacizumab is an infusion, not an injection, and is part of her treatment for the neuroendocrine tumor. - Administer bevacizumab infusion today - Schedule bevacizumab infusions every Monday - Monitor for side effects and efficacy  Gastrointestinal Bleeding Black stools noted yesterday, indicating possible GI bleeding, likely exacerbated by missed bevacizumab doses. Restarting bevacizumab today should help control the bleeding. - Administer bevacizumab infusion today to help control bleeding - Monitor stool color and consistency - Check blood counts regularly  Anemia Chronic anemia with recent hemoglobin levels fluctuating between 7.2 and 8.6. Current  hemoglobin is 8.6. Black stools  noted yesterday, likely indicating GI bleeding. Scheduled for blood transfusion on February 26, but may not be necessary given current hemoglobin level. Typically, transfusions are not given at hemoglobin levels of 8.6, but she may need another transfusion next week. - Proceed with scheduled blood transfusion on February 26 if hemoglobin drops - Schedule labs on March 3 - Schedule blood transfusion on March 5 if needed - Monitor hemoglobin levels weekly  Congestive Heart Failure Recent leg swelling and infection. Currently managed with high-powered antibiotics. Swelling has reduced and wound is dry. - Continue current antibiotics as prescribed - Elevate legs to reduce swelling - Follow up with CHF doctor as scheduled  Goals of Care She has a DNR in place and wishes to continue treatment as long as quality of life is maintained. She expressed a desire to keep fighting despite frequent hospital visits and medical appointments. She is not ready for hospice care - Continue current treatment plan - Discuss any changes in condition or preferences with patient and family  Plan -Lab reviewed, will proceed bevacizumab infusion, Retacrit and Sandostatin injections today -No need for blood transfusion this week -Will schedule lab and injection every 2 weeks, with blood transfusion 2 days after -Will repeat lab next week to see if she needs blood transfusion      SUMMARY OF ONCOLOGIC HISTORY: Oncology History   No history exists.     Discussed the use of AI scribe software for clinical note transcription with the patient, who gave verbal consent to proceed.  History of Present Illness   The patient is an 89 year old female with a history of anemia and neuroendocrine tumor, who presents with ongoing issues of leg swelling and gastrointestinal bleeding. She has been in and out of the hospital and emergency room multiple times due to these issues. The patient was admitted to the hospital from  February 7th through February 14th due to leg swelling and infection. During this hospital stay, she received a blood transfusion due to low hemoglobin levels. Despite the transfusion, the patient continues to feel weak and has been experiencing black, gummy stools, which started yesterday. The patient reports that she has been passing these stools multiple times, sometimes as much as six times at night. The patient also reports that she has been experiencing incontinence, with the stool sometimes passing without her being aware of it. The patient's caregiver reports that the patient has been very weak and she has been struggling to manage her appointments and treatments.         All other systems were reviewed with the patient and are negative.  MEDICAL HISTORY:  Past Medical History:  Diagnosis Date   (HFpEF) heart failure with preserved ejection fraction (HCC)    EF=60-60%   Actinic keratosis 01/17/2015   R forearm   Adjustment disorder with anxiety    Adverse effect of other narcotics, sequela    Intolerance to all narcotics   Anti-Duffy antibodies present    Aortic valve stenosis    Arthritis    "some in my hands" (11/11/2012)   Atrial fibrillation, permanent (HCC)    Eliquis   Atypical mole 03/25/2018   L forearm - severe   Automatic implantable cardioverter-defibrillator in situ    Avascular necrosis of hip (HCC) 05/03/2011   Carotid artery stenosis 09/2007   a. 09/2007: 60-79% bilateral (stable); b. 10/2008: 40-59% R 60-79%    Cellulitis of left lower extremity 07/13/2020   CKD (chronic kidney disease), stage  [facility])    COPD (chronic obstructive pulmonary disease) (HCC)    Coronary artery disease    non-obstructive by 2006 cath   COVID-19 virus infection 02/12/2022   Displaced fracture of left femoral neck (HCC) 10/09/2018   GI bleed 03/28/2020   AVM   High cholesterol    Hypertension 05/20/2011   Hypertr obst cardiomyop    Hypotension, unspecified    cardiac cath  2006..nonobstructive CAD 30-40s lesions.Marland KitchenETT 1/09 nondiagnostic due to poor HR response..Right Renal Cancer 2003   Iron deficiency anemia    Long term (current) use of anticoagulants    On home oxygen therapy    3L [place]   Osteoarthritis of right hip    PAD (peripheral artery disease) (HCC)    Pancreatic cancer (HCC)    sandostatin   PONV (postoperative nausea and vomiting)    Presence of permanent cardiac pacemaker    Pulmonary HTN (HCC)    RECTAL BLEEDING 10/13/2009   Qualifier: Diagnosis of  By: Wilmon Pali NP, Paula     Red blood cell antibody positive with compatible PRBC difficult to obtain    Anti FYA (Duffy a) antibody. Must be transfused with PRB which are Duffy Antigen Negative and Crossmatch Compatible   Renal cell carcinoma (HCC)    s/p nephrectomy   Squamous cell carcinoma of skin 01/17/2015   R lat wrist   Squamous cell carcinoma of skin 01/29/2018   R post upper leg - superficially invasive   Squamous cell carcinoma of skin 03/25/2018   L lat foot   Squamous cell carcinoma of skin 11/09/2020   left lat foot - EDC 01/01/21, recurrent 01/31/21 - MOHs 02/22/21   Squamous cell carcinoma of skin 12/19/2021   Right Posterior Medial Thigh, EDC   Squamous cell carcinoma of skin 12/19/2021   SCC IS, L lat heel, EDC 02/04/2022   Squamous cell carcinoma of skin 01/21/2022   L forearm, EDC 02/04/2022   Squamous cell carcinoma of skin 01/21/2022   R lower leg below knee, EDC 02/04/2022   Squamous cell carcinoma of skin 01/21/2022   SCCIS, R post heel, EDC 02/04/2022   Squamous cell carcinoma of skin 07/01/2022   left lower abdomen, in situ, EDC   Squamous cell carcinoma of skin 07/01/2022   left medial chest, EDC   Urge incontinence    Venous stasis of both lower extremities     SURGICAL HISTORY: Past Surgical History:  Procedure Laterality Date   ABDOMINAL AORTOGRAM W/LOWER EXTREMITY N/A 02/12/2021   Procedure: ABDOMINAL AORTOGRAM W/LOWER EXTREMITY;  Surgeon: Maeola Harman, MD;  Location: [facility] INVASIVE CV LAB;  Service: Cardiovascular;  Laterality: N/A;   ABDOMINAL HYSTERECTOMY  1975   for benign causes   APPENDECTOMY     BI-VENTRICULAR PACEMAKER UPGRADE  05/04/2010   BIOPSY  02/28/2019   Procedure: BIOPSY;  Surgeon: Rachael Fee, MD;  Location: [facility] ENDOSCOPY;  Service: Endoscopy;;   BIOPSY  03/04/2022   Procedure: BIOPSY;  Surgeon: Lemar Lofty., MD;  Location: [facility] ENDOSCOPY;  Service: Gastroenterology;;   CARDIAC CATHETERIZATION  2006   CARDIOVERSION N/A 02/20/2018   Procedure: CARDIOVERSION;  Surgeon: Antonieta Iba, MD;  Location: [facility] ORS;  Service: Cardiovascular;  Laterality: N/A;   CARDIOVERSION N/A 03/27/2018   Procedure: CARDIOVERSION;  Surgeon: Antonieta Iba, MD;  Location: [facility] ORS;  Service: Cardiovascular;  Laterality: N/A;   CATARACT EXTRACTION W/ INTRAOCULAR LENS  IMPLANT, BILATERAL  01/2006-02-2006   CHOLECYSTECTOMY N/A 11/11/2012   Procedure: LAPAROSCOPIC CHOLECYSTECTOMY WITH  INTRAOPERATIVE CHOLANGIOGRAM;  Surgeon: Wilmon Arms. Corliss Skains, MD;  Location: [facility] OR;  Service: General;  Laterality: N/A;   COLONOSCOPY WITH PROPOFOL N/A 02/28/2019   Procedure: COLONOSCOPY WITH PROPOFOL;  Surgeon: Rachael Fee, MD;  Location: [facility] ENDOSCOPY;  Service: Endoscopy;  Laterality: N/A;   ENTEROSCOPY N/A 03/30/2020   Procedure: ENTEROSCOPY;  Surgeon: Shellia Cleverly, DO;  Location: [facility] ENDOSCOPY;  Service: Gastroenterology;  Laterality: N/A;   ENTEROSCOPY N/A 02/10/2021   Procedure: ENTEROSCOPY;  Surgeon: Jeani Hawking, MD;  Location: [facility] ENDOSCOPY;  Service: Endoscopy;  Laterality: N/A;   ENTEROSCOPY N/A 02/15/2022   Procedure: ENTEROSCOPY;  Surgeon: Imogene Burn, MD;  Location: [facility] ENDOSCOPY;  Service: Gastroenterology;  Laterality: N/A;   ENTEROSCOPY N/A 11/06/2022   Procedure: ENTEROSCOPY;  Surgeon: Meridee Score Netty Starring., MD;  Location: [facility] ENDOSCOPY;  Service: Gastroenterology;  Laterality: N/A;   EP IMPLANTABLE DEVICE N/A  02/21/2016   Procedure: ICD Generator Changeout;  Surgeon: Duke Salvia, MD;  Location: [facility] INVASIVE CV LAB;  Service: Cardiovascular;  Laterality: N/A;   ESOPHAGOGASTRODUODENOSCOPY N/A 03/04/2022   Procedure: ESOPHAGOGASTRODUODENOSCOPY (EGD);  Surgeon: Lemar Lofty., MD;  Location: [facility] ENDOSCOPY;  Service: Gastroenterology;  Laterality: N/A;   ESOPHAGOGASTRODUODENOSCOPY (EGD) WITH PROPOFOL N/A 02/28/2019   Procedure: ESOPHAGOGASTRODUODENOSCOPY (EGD) WITH PROPOFOL;  Surgeon: Rachael Fee, MD;  Location: [facility] ENDOSCOPY;  Service: Endoscopy;  Laterality: N/A;   EUS N/A 03/04/2022   Procedure: UPPER ENDOSCOPIC ULTRASOUND (EUS) RADIAL;  Surgeon: Lemar Lofty., MD;  Location: [facility] ENDOSCOPY;  Service: Gastroenterology;  Laterality: N/A;   FINE NEEDLE ASPIRATION N/A 03/04/2022   Procedure: FINE NEEDLE ASPIRATION (FNA) LINEAR;  Surgeon: Lemar Lofty., MD;  Location: [facility] ENDOSCOPY;  Service: Gastroenterology;  Laterality: N/A;   GIVENS CAPSULE STUDY N/A 03/15/2019   Procedure: GIVENS CAPSULE STUDY;  Surgeon: Tressia Danas, MD;  Location: [facility] ENDOSCOPY;  Service: Gastroenterology;  Laterality: N/A;   HOT HEMOSTASIS N/A 03/30/2020   Procedure: HOT HEMOSTASIS (ARGON PLASMA COAGULATION/BICAP);  Surgeon: Shellia Cleverly, DO;  Location: [facility] ENDOSCOPY;  Service: Gastroenterology;  Laterality: N/A;   HOT HEMOSTASIS N/A 02/10/2021   Procedure: HOT HEMOSTASIS (ARGON PLASMA COAGULATION/BICAP);  Surgeon: Jeani Hawking, MD;  Location: [facility] ENDOSCOPY;  Service: Endoscopy;  Laterality: N/A;   HOT HEMOSTASIS N/A 02/15/2022   Procedure: HOT HEMOSTASIS (ARGON PLASMA COAGULATION/BICAP);  Surgeon: Imogene Burn, MD;  Location: [facility] ENDOSCOPY;  Service: Gastroenterology;  Laterality: N/A;   HOT HEMOSTASIS N/A 11/06/2022   Procedure: HOT HEMOSTASIS (ARGON PLASMA COAGULATION/BICAP);  Surgeon: Lemar Lofty., MD;  Location: [facility] ENDOSCOPY;  Service: Gastroenterology;  Laterality: N/A;   INSERT  / REPLACE / REMOVE PACEMAKER  05-01-11   02-28-05-/05-04-10-ICD-MEDTRONIC MAXIMAL DR   IR ANGIOGRAM SELECTIVE EACH ADDITIONAL VESSEL  02/18/2022   IR ANGIOGRAM SELECTIVE EACH ADDITIONAL VESSEL  02/18/2022   IR ANGIOGRAM VISCERAL SELECTIVE  02/16/2022   IR ANGIOGRAM VISCERAL SELECTIVE  02/16/2022   IR EMBO ART  VEN HEMORR LYMPH EXTRAV  INC GUIDE ROADMAPPING  02/16/2022   IR THORACENTESIS ASP PLEURAL SPACE W/IMG GUIDE  02/21/2022   IR THORACENTESIS ASP PLEURAL SPACE W/IMG GUIDE  02/22/2022   IR US GUIDE VASC ACCESS RIGHT  02/16/2022   JOINT REPLACEMENT     LAPAROSCOPIC CHOLECYSTECTOMY  11/11/2012   LAPAROSCOPIC LYSIS OF ADHESIONS N/A 11/11/2012   Procedure: LAPAROSCOPIC LYSIS OF ADHESIONS;  Surgeon: Wilmon Arms. Corliss Skains, MD;  Location: [facility] OR;  Service: General;  Laterality: N/A;   NEPHRECTOMY Right 06/2001    S/P RENAL CELL CANCER   PERIPHERAL  VASCULAR INTERVENTION Bilateral 02/12/2021   Procedure: PERIPHERAL VASCULAR INTERVENTION;  Surgeon: Maeola Harman, MD;  Location: [facility] INVASIVE CV LAB;  Service: Cardiovascular;  Laterality: Bilateral;  Iliac artery stents   PRESSURE SENSOR/CARDIOMEMS N/A 02/03/2019   Procedure: PRESSURE SENSOR/CARDIOMEMS;  Surgeon: Laurey Morale, MD;  Location: [facility] INVASIVE CV LAB;  Service: Cardiovascular;  Laterality: N/A;   RIGHT HEART CATH N/A 11/09/2018   Procedure: RIGHT HEART CATH;  Surgeon: Laurey Morale, MD;  Location: [facility] INVASIVE CV LAB;  Service: Cardiovascular;  Laterality: N/A;   RIGHT HEART CATH N/A 03/08/2019   Procedure: RIGHT HEART CATH;  Surgeon: Laurey Morale, MD;  Location: [facility] INVASIVE CV LAB;  Service: Cardiovascular;  Laterality: N/A;   SUBMUCOSAL TATTOO INJECTION  02/28/2019   Procedure: SUBMUCOSAL TATTOO INJECTION;  Surgeon: Rachael Fee, MD;  Location: [facility] ENDOSCOPY;  Service: Endoscopy;;   SUBMUCOSAL TATTOO INJECTION  11/06/2022   Procedure: SUBMUCOSAL TATTOO INJECTION;  Surgeon: Lemar Lofty., MD;  Location: [facility] ENDOSCOPY;   Service: Gastroenterology;;   TOTAL HIP ARTHROPLASTY Right 05/03/2011   Procedure: TOTAL HIP ARTHROPLASTY ANTERIOR APPROACH;  Surgeon: Kathryne Hitch;  Location: [facility] ORS;  Service: Orthopedics;  Laterality: Right;  Removal of Cannulated Screws Right Hip, Right Direct Anterior Hip Replacement   TOTAL HIP ARTHROPLASTY Left 10/09/2018   Procedure: TOTAL HIP ARTHROPLASTY ANTERIOR APPROACH;  Surgeon: Samson Frederic, MD;  Location: [facility] OR;  Service: Orthopedics;  Laterality: Left;    I have reviewed the social history and family history with the patient and they are unchanged from previous note.  ALLERGIES:  is allergic to codeine, dilaudid [hydromorphone], tape, amoxil [amoxicillin], asa [aspirin], ms contin [morphine], neurontin [gabapentin], and nsaids.  MEDICATIONS:  Current Outpatient Medications  Medication Sig Dispense Refill   acetaminophen (TYLENOL) 500 MG tablet Take 1,000 mg by mouth every 8 (eight) hours.     Ascorbic Acid (VITAMIN C PO) Take 1 tablet by mouth daily.     Cholecalciferol (VITAMIN D-3 PO) Take 2 capsules by mouth daily.     CRANBERRY PO Take 1 tablet by mouth daily.     Cyanocobalamin (B-12 PO) Take 1 tablet by mouth daily.     ferrous gluconate (FERGON) 324 MG tablet Take 1 tablet (324 mg total) by mouth daily with breakfast. 30 tablet 3   fluticasone (FLONASE) 50 MCG/ACT nasal spray Place 2 sprays into both nostrils daily as needed for allergies or rhinitis. 16 g 0   metolazone (ZAROXOLYN) 2.5 MG tablet Take 1 tablet (2.5 mg total) by mouth once a week. Every Tuesday with 40 meq of potassium 90 tablet 3   Multiple Vitamins-Minerals (HAIR SKIN NAILS PO) Take 1 capsule by mouth daily.     pantoprazole (PROTONIX) 40 MG tablet Take 1 tablet (40 mg total) by mouth daily. (Patient taking differently: Take 40 mg by mouth daily before breakfast.) 30 tablet 3   rosuvastatin (CRESTOR) 10 MG tablet Take 1 tablet (10 mg total) by mouth daily. 30 tablet 0   torsemide (DEMADEX)  20 MG tablet Take 5 tablets (100 mg total) by mouth 2 (two) times daily.     No current facility-administered medications for this visit.    PHYSICAL EXAMINATION: ECOG PERFORMANCE STATUS: 3 - Symptomatic, >50% confined to bed  Vitals:   06/30/23 1151  BP: (!) 123/52  Pulse: 60  Resp: 15  Temp: 98.1 F (36.7 C)  SpO2: 100%   Wt Readings from Last 3 Encounters:  06/30/23 132 lb 12.8 oz (60.2 kg)  06/24/23 140 lb (63.5 kg)  06/14/23 140 lb (63.5 kg)     GENERAL:alert, no distress and comfortable EYES: normal, Conjunctiva are pink and non-injected, sclera clear Musculoskeletal:no cyanosis of digits and no clubbing  NEURO: alert & oriented x 3 with fluent speech, no focal motor/sensory deficits   LABORATORY DATA:  I have reviewed the data as listed    Latest Ref Rng & Units 06/30/2023   10:39 AM 06/24/2023    4:21 PM 06/23/2023    3:18 PM  CBC  WBC 4.0 - 10.5 K/uL 4.3  4.4  4.6   Hemoglobin 12.0 - 15.0 g/dL 8.6  7.2  7.2   Hematocrit 36.0 - 46.0 % 29.7  25.1  24.4   Platelets 150 - 400 K/uL 191  193  216         Latest Ref Rng & Units 06/24/2023    4:21 PM 06/23/2023    3:18 PM 06/20/2023    4:19 AM  CMP  Glucose 70 - 99 mg/dL 098  83  119   BUN 8 - 23 mg/dL 63  63  56   Creatinine 0.44 - 1.00 mg/dL 1.47  8.29  5.62   Sodium 135 - 145 mmol/L 136  137  135   Potassium 3.5 - 5.1 mmol/L 3.5  3.9  5.3   Chloride 98 - 111 mmol/L 92  92  99   CO2 22 - 32 mmol/L 34  32  26   Calcium 8.9 - 10.3 mg/dL 9.2  8.7  8.6   Total Protein 6.5 - 8.1 g/dL 7.3     Total Bilirubin 0.0 - 1.2 mg/dL 0.7     Alkaline Phos 38 - 126 U/L 58     AST 15 - 41 U/L 19     ALT 0 - 44 U/L 13         RADIOGRAPHIC STUDIES: I have personally reviewed the radiological images as listed and agreed with the findings in the report. No results found.    Orders Placed This Encounter  Procedures   CBC with Differential (Cancer Center Only)    Standing Status:   Future    Expected Date:   07/01/2023     Expiration Date:   06/30/2024   Total Protein, Urine dipstick    Standing Status:   Future    Expected Date:   07/01/2023    Expiration Date:   06/30/2024   CBC with Differential (Cancer Center Only)    Standing Status:   Future    Expected Date:   07/15/2023    Expiration Date:   07/14/2024   Total Protein, Urine dipstick    Standing Status:   Future    Expected Date:   07/15/2023    Expiration Date:   07/14/2024   CBC with Differential (Cancer Center Only)    Standing Status:   Future    Expected Date:   07/29/2023    Expiration Date:   07/28/2024   Total Protein, Urine dipstick    Standing Status:   Future    Expected Date:   07/29/2023    Expiration Date:   07/28/2024   All questions were answered. The patient knows to call the clinic with any problems, questions or concerns. No barriers to learning was detected. The total time spent in the appointment was 30 minutes.     Malachy Mood, MD 06/30/2023

## 2023-06-30 NOTE — Patient Instructions (Signed)
 Bevacizumab Injection What is this medication? BEVACIZUMAB (be va SIZ yoo mab) treats some types of cancer. It works by blocking a protein that causes cancer cells to grow and multiply. This helps to slow or stop the spread of cancer cells. It is a monoclonal antibody. This medicine may be used for other purposes; ask your health care provider or pharmacist if you have questions. COMMON BRAND NAME(S): Alymsys, Avastin, MVASI, Rosaland Lao What should I tell my care team before I take this medication? They need to know if you have any of these conditions: Blood clots Coughing up blood Having or recent surgery Heart failure High blood pressure History of a connection between 2 or more body parts that do not usually connect (fistula) History of a tear in your stomach or intestines Protein in your urine An unusual or allergic reaction to bevacizumab, other medications, foods, dyes, or preservatives Pregnant or trying to get pregnant Breast-feeding How should I use this medication? This medication is injected into a vein. It is given by your care team in a hospital or clinic setting. Talk to your care team the use of this medication in children. Special care may be needed. Overdosage: If you think you have taken too much of this medicine contact a poison control center or emergency room at once. NOTE: This medicine is only for you. Do not share this medicine with others. What if I miss a dose? Keep appointments for follow-up doses. It is important not to miss your dose. Call your care team if you are unable to keep an appointment. What may interact with this medication? Interactions are not expected. This list may not describe all possible interactions. Give your health care provider a list of all the medicines, herbs, non-prescription drugs, or dietary supplements you use. Also tell them if you smoke, drink alcohol, or use illegal drugs. Some items may interact with your medicine. What  should I watch for while using this medication? Your condition will be monitored carefully while you are receiving this medication. You may need blood work while taking this medication. This medication may make you feel generally unwell. This is not uncommon as chemotherapy can affect healthy cells as well as cancer cells. Report any side effects. Continue your course of treatment even though you feel ill unless your care team tells you to stop. This medication may increase your risk to bruise or bleed. Call your care team if you notice any unusual bleeding. Before having surgery, talk to your care team to make sure it is ok. This medication can increase the risk of poor healing of your surgical site or wound. You will need to stop this medication for 28 days before surgery. After surgery, wait at least 28 days before restarting this medication. Make sure the surgical site or wound is healed enough before restarting this medication. Talk to your care team if questions. Talk to your care team if you may be pregnant. Serious birth defects can occur if you take this medication during pregnancy and for 6 months after the last dose. Contraception is recommended while taking this medication and for 6 months after the last dose. Your care team can help you find the option that works for you. Do not breastfeed while taking this medication and for 6 months after the last dose. This medication can cause infertility. Talk to your care team if you are concerned about your fertility. What side effects may I notice from receiving this medication? Side effects that you should  report to your care team as soon as possible: Allergic reactions--skin rash, itching, hives, swelling of the face, lips, tongue, or throat Bleeding--bloody or black, tar-like stools, vomiting blood or brown material that looks like coffee grounds, red or dark brown urine, small red or purple spots on skin, unusual bruising or bleeding Blood  clot--pain, swelling, or warmth in the leg, shortness of breath, chest pain Heart attack--pain or tightness in the chest, shoulders, arms, or jaw, nausea, shortness of breath, cold or clammy skin, feeling faint or lightheaded Heart failure--shortness of breath, swelling of the ankles, feet, or hands, sudden weight gain, unusual weakness or fatigue Increase in blood pressure Infection--fever, chills, cough, sore throat, wounds that don't heal, pain or trouble when passing urine, general feeling of discomfort or being unwell Infusion reactions--chest pain, shortness of breath or trouble breathing, feeling faint or lightheaded Kidney injury--decrease in the amount of urine, swelling of the ankles, hands, or feet Stomach pain that is severe, does not go away, or gets worse Stroke--sudden numbness or weakness of the face, arm, or leg, trouble speaking, confusion, trouble walking, loss of balance or coordination, dizziness, severe headache, change in vision Sudden and severe headache, confusion, change in vision, seizures, which may be signs of posterior reversible encephalopathy syndrome (PRES) Side effects that usually do not require medical attention (report to your care team if they continue or are bothersome): Back pain Change in taste Diarrhea Dry skin Increased tears Nosebleed This list may not describe all possible side effects. Call your doctor for medical advice about side effects. You may report side effects to FDA at 1-800-FDA-1088. Where should I keep my medication? This medication is given in a hospital or clinic. It will not be stored at home. NOTE: This sheet is a summary. It may not cover all possible information. If you have questions about this medicine, talk to your doctor, pharmacist, or health care provider.  2024 Elsevier/Gold Standard (2021-09-07 00:00:00)

## 2023-07-07 ENCOUNTER — Encounter: Payer: Self-pay | Admitting: Internal Medicine

## 2023-07-07 ENCOUNTER — Encounter: Payer: Self-pay | Admitting: Hematology

## 2023-07-07 ENCOUNTER — Inpatient Hospital Stay: Payer: PPO | Attending: Nurse Practitioner

## 2023-07-07 ENCOUNTER — Encounter: Payer: Self-pay | Admitting: Oncology

## 2023-07-07 DIAGNOSIS — N1832 Chronic kidney disease, stage 3b: Secondary | ICD-10-CM | POA: Diagnosis not present

## 2023-07-07 DIAGNOSIS — D3A8 Other benign neuroendocrine tumors: Secondary | ICD-10-CM | POA: Insufficient documentation

## 2023-07-07 DIAGNOSIS — D649 Anemia, unspecified: Secondary | ICD-10-CM

## 2023-07-07 DIAGNOSIS — Z79899 Other long term (current) drug therapy: Secondary | ICD-10-CM | POA: Diagnosis not present

## 2023-07-07 DIAGNOSIS — D631 Anemia in chronic kidney disease: Secondary | ICD-10-CM | POA: Insufficient documentation

## 2023-07-07 DIAGNOSIS — D500 Iron deficiency anemia secondary to blood loss (chronic): Secondary | ICD-10-CM

## 2023-07-07 DIAGNOSIS — Z5112 Encounter for antineoplastic immunotherapy: Secondary | ICD-10-CM | POA: Diagnosis not present

## 2023-07-07 DIAGNOSIS — K5520 Angiodysplasia of colon without hemorrhage: Secondary | ICD-10-CM

## 2023-07-07 LAB — CBC WITH DIFFERENTIAL (CANCER CENTER ONLY)
Abs Immature Granulocytes: 0.01 10*3/uL (ref 0.00–0.07)
Basophils Absolute: 0 10*3/uL (ref 0.0–0.1)
Basophils Relative: 1 %
Eosinophils Absolute: 0 10*3/uL (ref 0.0–0.5)
Eosinophils Relative: 0 %
HCT: 28.8 % — ABNORMAL LOW (ref 36.0–46.0)
Hemoglobin: 8.5 g/dL — ABNORMAL LOW (ref 12.0–15.0)
Immature Granulocytes: 0 %
Lymphocytes Relative: 12 %
Lymphs Abs: 0.5 10*3/uL — ABNORMAL LOW (ref 0.7–4.0)
MCH: 25.6 pg — ABNORMAL LOW (ref 26.0–34.0)
MCHC: 29.5 g/dL — ABNORMAL LOW (ref 30.0–36.0)
MCV: 86.7 fL (ref 80.0–100.0)
Monocytes Absolute: 0.6 10*3/uL (ref 0.1–1.0)
Monocytes Relative: 13 %
Neutro Abs: 3.3 10*3/uL (ref 1.7–7.7)
Neutrophils Relative %: 74 %
Platelet Count: 192 10*3/uL (ref 150–400)
RBC: 3.32 MIL/uL — ABNORMAL LOW (ref 3.87–5.11)
RDW: 21.2 % — ABNORMAL HIGH (ref 11.5–15.5)
WBC Count: 4.5 10*3/uL (ref 4.0–10.5)
nRBC: 0 % (ref 0.0–0.2)

## 2023-07-08 ENCOUNTER — Encounter: Payer: PPO | Admitting: Cardiology

## 2023-07-08 ENCOUNTER — Other Ambulatory Visit: Payer: Self-pay

## 2023-07-08 ENCOUNTER — Encounter (HOSPITAL_COMMUNITY): Payer: PPO

## 2023-07-09 ENCOUNTER — Other Ambulatory Visit: Payer: PPO

## 2023-07-09 ENCOUNTER — Inpatient Hospital Stay: Payer: PPO

## 2023-07-09 ENCOUNTER — Telehealth: Payer: Self-pay | Admitting: Cardiology

## 2023-07-09 NOTE — Telephone Encounter (Signed)
 Unable to confirm appt phone gives busy signal

## 2023-07-10 ENCOUNTER — Other Ambulatory Visit
Admission: RE | Admit: 2023-07-10 | Discharge: 2023-07-10 | Disposition: A | Discharge status: Home / Self Care | Source: Ambulatory Visit | Attending: Cardiology | Admitting: Cardiology

## 2023-07-10 ENCOUNTER — Ambulatory Visit: Payer: PPO | Attending: Cardiology | Admitting: Cardiology

## 2023-07-10 VITALS — BP 141/79 | HR 67 | Wt 132.0 lb

## 2023-07-10 DIAGNOSIS — I5032 Chronic diastolic (congestive) heart failure: Secondary | ICD-10-CM

## 2023-07-10 LAB — BASIC METABOLIC PANEL
Anion gap: 12 (ref 5–15)
BUN: 52 mg/dL — ABNORMAL HIGH (ref 8–23)
CO2: 30 mmol/L (ref 22–32)
Calcium: 8.9 mg/dL (ref 8.9–10.3)
Chloride: 92 mmol/L — ABNORMAL LOW (ref 98–111)
Creatinine, Ser: 1.7 mg/dL — ABNORMAL HIGH (ref 0.44–1.00)
GFR, Estimated: 29 mL/min — ABNORMAL LOW (ref >60)
Glucose, Bld: 77 mg/dL (ref 70–99)
Potassium: 3.8 mmol/L (ref 3.5–5.1)
Sodium: 134 mmol/L — ABNORMAL LOW (ref 135–145)

## 2023-07-10 LAB — BRAIN NATRIURETIC PEPTIDE: B Natriuretic Peptide: 322.2 pg/mL — ABNORMAL HIGH (ref 0.0–100.0)

## 2023-07-10 MED ORDER — TORSEMIDE 20 MG PO TABS: 100.0000 mg | ORAL_TABLET | Freq: Two times a day (BID) | ORAL | 3 refills | Status: DC

## 2023-07-10 MED ORDER — POTASSIUM CHLORIDE ER 20 MEQ PO TBCR: 20.0000 meq | EXTENDED_RELEASE_TABLET | Freq: Every day | ORAL | 3 refills | Status: DC

## 2023-07-10 MED ORDER — METOLAZONE 2.5 MG PO TABS: 2.5000 mg | ORAL_TABLET | ORAL | 3 refills | Status: DC

## 2023-07-10 NOTE — Progress Notes (Signed)
 [facility] NOTE    ID:  [name], DOB [birth year], MRN [id number]    PCP:  [name], MD (last seen 10/24) Cardiologist:  Sherryl Manges, MD (last seen 11/24) Primary HF: Dr. Shirlee Latch (last seen 12/24)  Chief Complaint: CHF   History of Present Illness:  89 y.o. with history of permanent atrial fibrillation, chronic diastolic CHF, CKD stage 3, smoking/prior COPD, renal cell CA s/p right nephrectomy. She has a prior history of HFrEF with nonischemic cardiomyopathy and had a Medtronic ICD placed. However, subsequently her EF has recovered.    06/20, she had left hip ORIF after mechanical fall.  Echo in 06/20 showed EF 60-65%, mild RV dilation with normal systolic function, PASP 93 mmHg.  After she got home from the hospital stay post-ORIF, she noted marked peripheral edema. She was short of breath walking short distances.  +orthopnea. She came back to the hospital 07/20 and was admitted with volume overload. She was diuresed aggressively and lost about 22 lbs. RHC showed pulmonary venous hypertension. PCWP tracing had prominent v-waves in absence of significant MR, suggesting significant diastolic dysfunction. Noted to have significant Fe deficiency anemia, but FOBT was negative during 7/20 hospitalization.   Admitted 11/20 with symptomatic anemia, GI workup concerning for ischemic bowel. 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 Durwin Nora with VVS and conservative management recommended. Capsule endoscopy showed no definite source of bleeding. She was volume overloaded due to significant RV dysfunction and diuresed, she also developed AKI on CKD stage 3.      10/22, she had bilateral CIA stents placed. ABIs in 11/22 showed patent stents. She had a skin cancer removed from her left leg. 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   Echo 2/23 EF 65-70% with mild LVH, D-shaped septum with moderate RV enlargement, and mildly  decreased RV function, PASP 92, moderate-severe TR, dilated IVC, moderate aortic stenosis. Echo in 10/23 showed EF 60-65%, D-shaped septum, mild RV enlargement with normal RV systolic function, PASP 90, moderate-severe TR, mild AS, IVC dilated.   Patient was found to have a neuroendocrine tumor of the pancreatic head in the fall of 2023.  She has been treated with sandostatin monthly.  She also had further GI bleeding from small bowel AVMs in fall 2023. She also had COVID-19 in 10/23 and has been on home oxygen 2L since that time.   Follow up with Dr. Graciela Husbands, long discussion about EOL. Given DNR form and high-voltage therapies inactivated on ICD.  Admit 07/24 with acute on chronic blood loss anemia 2/2 GI bleed. Transfused and given IV iron. Had small bowel enteroscopy which showed 2 nonbleeding angiodysplastic lesions in duodenum and 3 nonbleeding angioplastic lesions in jejunum treated with APC.  Admitted early 09/24 with acute on chronic anemia suspected 2/2 chronic GI blood loss. Hgb down to 6.8. Received 2 u RBCs. GI did not recommend any endoscopic procedures d/t advanced age and comorbidities. Course further c/b acute on chronic CHF. She was diuresed with IV lasix and metolazone. Echo during admit with LVEF 65-70%, D-shaped septum, mildly reduced RV, RVSP 103 mmHg, biatrial enlargement, severe TR, moderate mitral stenosis with mean gradient of 6 mmHg, dilated IVC.  She was seen back in the [facility] on 01/30/23 for post hospital f/u and noted recurrent melena, SOB and fatigued w/ minimal exertion. Also noted to have recurrent volume overload w/ marked return of LEE. Hgb was down to 6.6. She was sent  back to the ED and readmitted again for a/c CHF and GIB, treated w/ blood transfusions, IV Fe and diuretics. Discharged on 02/06/23 on torsemide 100 mg bid.   S/p 2u RBCs 04/26/23 for symptomatic anemia. Hgb 6.8.   Admitted 05/27/23 due to shortness of breath/ leg swelling. Initially given IV lasix  with  removal of ~ 4Land transitioned to oral torsemide at 100mg  BID at discharge. Metolazone decreased to weekly. Treated with antibiotics for leg cellulitis. Given 1 unit PRBC's due to hemoglobin trending down to 7.1. Wound care consulted due to weeping area on left shin. Palliative care also consulted.   She was admitted in 2/25 with lower leg cellulitis.  She was treated with IV abx.   Today she returns, with her niece, for followup of CHF.  Legs are starting to swell again. She is currently taking metolazone 2.5 mg twice weekly and torsemide 80 mg bid.  She has been using 2L home oxygen.  Weight is down 9 lbs since last visit.  She continues on bevacizumab for her pancreatic neuroendocrine tumor.  No BRBPR/melena.  No dyspnea walking slowly around the house and dressing.  She is short of breath if she "hurries."  She has occasional episodes of sharp, atypicalchest pain.  She sleeps on the sofa due to chronic orthopnea.   She has not been using her Cardiomems, unable to lie on the pillow.   Labs (10/23): magnesium 1.7 Labs (3/24): TSH 2.93 Labs (4/24): K 3.9, creatinine 1.69, ALT 9, hgb 10, Plt 175 Labs (8/24): K 4.5, creatinine, 1.6 Labs (10/24): K 4.1, creatinine 1.55, Hgb 7.6 Labs (11/24): K 4.6, SCr 1.52 Labs (11/24): K 4.6, SCr 1.82 => 1.78, TSat 3, ferritin 16 Labs (12/24): K 3.9, creatinine 1.83 Labs (01/25): K 4.2, creatinine 1.89 Labs (2/25): hgb 8.5, BNP 517, K 3.5, creatinine 1.86  PMH: 1. COPD 2. Renal cell carcinoma: S/p right kidney resection.  3. CKD stage 3 4. Fe deficiency anemia 5. Atrial fibrillation: Permanent. She has failed amiodarone, Tikosyn, and flecainide as well as multiple cardioversions. 6. Chronic diastolic CHF: She has history of prior HFrEF with nonischemic cardiomyopathy.  She has a Medtronic ICD.   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  - Echo (6/20): EF 60-65%, mildly dilated RV with PASP 93 mmHg, mild-moderate TR.  - RHC (7/20): mean RA 11, PA 64/18  mean 39, mean PCWP 24 with v waves to 41, CI 4.21, PVR 1.9 WU.  - Cardiomems placement - RHC (11/20): mean RA 13, PA 63/23 mean 25 with v waves to 47 (MR only mild by echo), mean PCWP 25, CI 3.18, PVR 2.6 WU - Echo (11/21): EF 65-70%, moderate LVH, D-shaped septum with mildly decreased RV systolic function, RVSP 85.9 mmHg, severe RAE, severe TR, mod-sev AS. - Echo (2/23): EF 65-70% with mild LVH, D-shaped septum with moderate RV enlargement, and mildly decreased RV function, PASP 92, moderate-severe TR, dilated IVC, moderate aortic stenosis.  - Echo (10/23): EF 60-65%, D-shaped septum, mild RV enlargement with normal RV systolic function, PASP 90, moderate-severe TR, mild AS, IVC dilated.  - Echo (9/24): LVEF 65-70%, D-shaped septum, mildly reduced RV, RVSP 103 mmHg, biatrial enlargement, severe TR, moderate mitral stenosis with mean gradient of 6 mmHg, dilated IVC - Echo (01/25): EF 65-70%, mild LVH, RV moderately reduced, severely elevated PA pressure 83.9 mmHg, mild MR, moderate MS with mean gradient of 6.0 mmHg, IVC dialted 7. Hyperlipidemia 8. Carotid stenosis: Carotid dopplers (3/19) with 40-59% LICA stenosis.   9.  Possible ischemic bowel in 11/20 10. 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 Conservative management per Dr. Durwin Nora.  - 10/22 bilateral CIA stents.  - 11/22 ABIs: 1.07 left, 0.83 right with patent CIA stents.  11. 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  12. Squamous cell skin cancer.  13. 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  14. COVID 19 in 10/23    Current Outpatient Medications on File Prior to Visit  Medication Sig Dispense Refill   acetaminophen (TYLENOL) 500 MG tablet Take 1,000 mg by mouth every 8 (eight) hours.     Ascorbic Acid (VITAMIN C PO) Take 1 tablet by mouth daily.     Cholecalciferol (VITAMIN D-3 PO) Take 2 capsules by mouth daily.     CRANBERRY PO Take 1 tablet by  mouth daily.     Cyanocobalamin (B-12 PO) Take 1 tablet by mouth daily.     ferrous gluconate (FERGON) 324 MG tablet Take 1 tablet (324 mg total) by mouth daily with breakfast. 30 tablet 3   fluticasone (FLONASE) 50 MCG/ACT nasal spray Place 2 sprays into both nostrils daily as needed for allergies or rhinitis. 16 g 0   Multiple Vitamins-Minerals (HAIR SKIN NAILS PO) Take 1 capsule by mouth daily.     pantoprazole (PROTONIX) 40 MG tablet Take 1 tablet (40 mg total) by mouth daily. (Patient taking differently: Take 40 mg by mouth daily before breakfast.) 30 tablet 3   rosuvastatin (CRESTOR) 10 MG tablet Take 1 tablet (10 mg total) by mouth daily. 30 tablet 0   No current facility-administered medications on file prior to visit.     Allergies:   Dilaudid [hydromorphone], Fentanyl, Codeine, Morphine and related, Amoxicillin, Hydrocodone, Oxycodone, Tramadol, Whole blood, and Sulfonamide derivatives    Social History:  The patient  reports that she quit smoking about 21 years ago. Her smoking use included cigarettes. She has a 20.00 pack-year smoking history. She has never used smokeless tobacco. She reports that she does not drink alcohol and does not use drugs.    Family History:  The patient's family history includes Breast cancer in her cousin and another family member; Breast cancer (age of onset: 59) in her maternal aunt; Early death in her father; Heart failure in her mother.    ROS:  Please see the history of present illness.   All other systems are personally reviewed and negative.    Vitals:   07/10/23 1542  BP: (!) 141/79  Pulse: 67  SpO2: 100%  Weight: 132 lb (59.9 kg)  PF: (!) 2 L/min   Wt Readings from Last 3 Encounters:  07/10/23 132 lb (59.9 kg)  06/30/23 132 lb 12.8 oz (60.2 kg)  06/24/23 140 lb (63.5 kg)   Lab Results  Component Value Date   CREATININE 1.70 (H) 07/10/2023   CREATININE 1.86 (H) 06/24/2023   CREATININE 2.12 (H) 06/23/2023    PHYSICAL EXAM: General:  NAD, frail Neck: JVP 10-11 cm with HJR, no thyromegaly or thyroid nodule.  Lungs: Decrease BS right base.  CV: Nondisplaced PMI.  Heart irregular S1/S2, no S3/S4, 2/6 HSM LLSB.  1+ edema to knees.  No carotid bruit.  Normal pedal pulses.  Abdomen: Soft, nontender, no hepatosplenomegaly, no distention.  Skin: Intact without lesions or rashes.  Neurologic: Alert and oriented x 3.  Psych: Normal affect. Extremities: No clubbing or cyanosis.  HEENT: Normal.   ASSESSMENT AND PLAN:  1. Chronic diastolic CHF: With  prominent RV failure. Echo in 6/20 showed EF 60-65%, mild RV dilation with normal systolic function, PASP 93 mmHg.  She had remote nonischemic cardiomyopathy with recovery of EF, has Medtronic ICD. Atrial fibrillation likely contributes, but this appears to be permanent now as she has failed multiple anti-arrhythmics and is reasonably rate-controlled. RHC 11/20 showed elevated filling pressures and pulmonary venous hypertension. Prominent v-waves on PCWP tracing likely due to stiff ventricle/diastolic dysfunction as she had only mild MR on echo. Echo 10/23 with EF 60-65%, D-shaped septum, mild RV enlargement with normal RV systolic function, PASP 90, moderate-severe TR, mild AS, IVC dilated. Echo in 9/242 with LVEF 65-70%, D-shaped septum, mildly reduced RV, RVSP 103 mmHg, biatrial enlargement, severe TR, mild AS with mean gradient of 17 mmhg, moderate mitral stenosis with mean gradient of 6 mmHg, dilated IVC. She is RV pacing at a high percentage, but LV EF has remained normal. NYHA class III.  She is volume overloaded on exam today.  - Continue metolazone 2.5 mg twice a week.  - Increase torsemide to 100 mg bid. Add KCl 20 daily (40 on metolazone days). BMET/BNP today, BMET in 10 days.  - Off SGLT2i after UTIs 2. Hypertension: Mildly elevated, will not add antihypertensives.  3. Pulmonary hypertension:  Based on RHC 11/20, she has primarily pulmonary venous hypertension, not candidate for  pulmonary vasodilators.  4. CKD: Stage 4. Off SGLT2i.  BMET today.  5. Chronic anemia/GI bleeding: Due to small bowel AVMs. She has had transfusions and IV Fe.  - She is now off anticoagulation.  6. Atrial fibrillation: Permanent. Rate control is reasonable off nodal blockers. She has failed amiodarone, Tikosyn, and flecainide as well as multiple cardioversions.  - Off Eliquis with recurrent GI bleeding and transfusions.  - Not Watchman candidate due to frailty and poorly compensated CHF.  7. Neuroendocrine tumor of pancreatic head - Treating with bevacizumab.   Followup 2 wks with NP.   I spent 31 minutes reviewing records, interviewing/examining patient, and managing orders.    [name], 07/10/23

## 2023-07-10 NOTE — Patient Instructions (Signed)
 CHANGE Torsemide to 100 mg Twice daily  Take 20 meq ( 1 Tab) of potassium daily.  Go over to the [facility]. Go pass the gift shop and have your blood work completed.  We will only call you if the results are abnormal or if the provider would like to make medication changes.  Your physician recommends that you schedule a follow-up appointment in: 2 weeks  At the [facility], you and your health needs are our priority. As part of our continuing mission to provide you with exceptional heart care, we have created designated Provider Care Teams. These Care Teams include your primary Cardiologist (physician) and Advanced Practice Providers (APPs- Physician Assistants and Nurse Practitioners) who all work together to provide you with the care you need, when you need it.   You may see any of the following providers on your designated Care Team at your next follow up: Dr Arvilla Meres Dr Marca Ancona Dr. Dorthula Nettles Dr. Clearnce Hasten Amy Filbert Schilder, NP Robbie Lis, [place] [facility] [place], [place] Brynda Peon, NP [place] Lee, NP Clarisa Kindred, NP Karle Plumber, PharmD Enos Fling, PharmD   Please be sure to bring in all your medications bottles to every appointment.    Thank you for choosing [facility]

## 2023-07-13 NOTE — Assessment & Plan Note (Signed)
 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   -Outpatient EUS 03/04/2022 by Dr. 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 Freida Busman did not recommend surgery in multifocal NET and also due to her age and co-morbidities. -She began first line sandostatin injections q28 days on 03/27/22.  -Dotatate PET scan, done 2 weeks after first injection 04/10/22, shows no significant tracer avidity in the pancrease or distant sites. Findings are likely due to close timing relative to the sandostatin injection blocking receptors. When we repeat this for restaging next year, will arrange 4 weeks after sandostatin -She has persistent anemia, likely secondary to GI bleeding from the tumor and AVM, she has had multiple hospital admission for anemia and bleeding.

## 2023-07-13 NOTE — Assessment & Plan Note (Signed)
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 -She previously had reaction to blood transfusion, or required multiple premedications for blood transfusion -She was recently hospitalized for anemia and a GI bleeding in July 2024. -She is currently on ESA every 2 weeks, IV iron as needed, and blood transfusion as needed. -Due to her multiple hospital admission for anemia and GI bleeding, she started bevacizumab for AVM on 02/12/2023

## 2023-07-14 ENCOUNTER — Inpatient Hospital Stay: Payer: PPO

## 2023-07-14 ENCOUNTER — Encounter: Payer: Self-pay | Admitting: Hematology

## 2023-07-14 ENCOUNTER — Ambulatory Visit: Payer: Self-pay

## 2023-07-14 ENCOUNTER — Inpatient Hospital Stay (HOSPITAL_BASED_OUTPATIENT_CLINIC_OR_DEPARTMENT_OTHER): Payer: PPO | Admitting: Hematology

## 2023-07-14 VITALS — BP 130/62 | HR 61 | Temp 97.8°F | Resp 18 | Wt 134.9 lb

## 2023-07-14 DIAGNOSIS — Z5112 Encounter for antineoplastic immunotherapy: Secondary | ICD-10-CM | POA: Diagnosis not present

## 2023-07-14 DIAGNOSIS — D3A8 Other benign neuroendocrine tumors: Secondary | ICD-10-CM | POA: Diagnosis not present

## 2023-07-14 DIAGNOSIS — K5520 Angiodysplasia of colon without hemorrhage: Secondary | ICD-10-CM

## 2023-07-14 DIAGNOSIS — D649 Anemia, unspecified: Secondary | ICD-10-CM

## 2023-07-14 DIAGNOSIS — D500 Iron deficiency anemia secondary to blood loss (chronic): Secondary | ICD-10-CM

## 2023-07-14 LAB — SAMPLE TO BLOOD BANK

## 2023-07-14 LAB — CBC WITH DIFFERENTIAL (CANCER CENTER ONLY)
Abs Immature Granulocytes: 0.01 10*3/uL (ref 0.00–0.07)
Basophils Absolute: 0 10*3/uL (ref 0.0–0.1)
Basophils Relative: 1 %
Eosinophils Absolute: 0 10*3/uL (ref 0.0–0.5)
Eosinophils Relative: 1 %
HCT: 30.8 % — ABNORMAL LOW (ref 36.0–46.0)
Hemoglobin: 8.9 g/dL — ABNORMAL LOW (ref 12.0–15.0)
Immature Granulocytes: 0 %
Lymphocytes Relative: 14 %
Lymphs Abs: 0.5 10*3/uL — ABNORMAL LOW (ref 0.7–4.0)
MCH: 24.9 pg — ABNORMAL LOW (ref 26.0–34.0)
MCHC: 28.9 g/dL — ABNORMAL LOW (ref 30.0–36.0)
MCV: 86 fL (ref 80.0–100.0)
Monocytes Absolute: 0.3 10*3/uL (ref 0.1–1.0)
Monocytes Relative: 10 %
Neutro Abs: 2.5 10*3/uL (ref 1.7–7.7)
Neutrophils Relative %: 74 %
Platelet Count: 200 10*3/uL (ref 150–400)
RBC: 3.58 MIL/uL — ABNORMAL LOW (ref 3.87–5.11)
RDW: 20 % — ABNORMAL HIGH (ref 11.5–15.5)
WBC Count: 3.4 10*3/uL — ABNORMAL LOW (ref 4.0–10.5)
nRBC: 0 % (ref 0.0–0.2)

## 2023-07-14 LAB — TOTAL PROTEIN, URINE DIPSTICK: Protein, ur: NEGATIVE mg/dL

## 2023-07-14 MED ORDER — EPOETIN ALFA-EPBX 40000 UNIT/ML IJ SOLN
40000.0000 [IU] | Freq: Once | INTRAMUSCULAR | Status: AC
  Administered 2023-07-14: 40000 [IU] via SUBCUTANEOUS
  Filled 2023-07-14: qty 1

## 2023-07-14 MED ORDER — SODIUM CHLORIDE 0.9 % IV SOLN: Freq: Once | INTRAVENOUS | Status: AC

## 2023-07-14 MED ORDER — SODIUM CHLORIDE 0.9 % IV SOLN
5.0000 mg/kg | Freq: Once | INTRAVENOUS | Status: AC
  Administered 2023-07-14: 300 mg via INTRAVENOUS
  Filled 2023-07-14: qty 12

## 2023-07-14 NOTE — Progress Notes (Signed)
 [facility]   Telephone:([phone]) [phone] Fax:(336) [phone]   Clinic Follow up Note   Patient Care Team: [name], MD as PCP - General (Family Medicine) Laurey Morale, MD as PCP - Advanced Heart Failure (Cardiology) Antonieta Iba, MD as PCP - Cardiology (Cardiology) Duke Salvia, MD as PCP - Electrophysiology (Cardiology) Antonieta Iba, MD as Consulting Physician (Cardiology) Duke Salvia, MD as Consulting Physician (Cardiology) [name], Latina Craver, MD as Consulting Physician (Vascular Surgery) Creig Hines, MD as Consulting Physician (Hematology and Oncology) Malachy Mood, MD as Consulting Physician (Oncology) Kathyrn Sheriff, [facility] (Inactive) as Pharmacist (Pharmacist) Otho Ket, RN as VBCI Care Management  Date of Service:  07/14/2023  CHIEF COMPLAINT: f/u of anemia and neuroendocrine tumor  CURRENT THERAPY:  Bevacizumab every 2 weeks Aranesp every 2 weeks Sandostatin injection every 4 weeks  Oncology History   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   -Outpatient EUS 03/04/2022 by Dr. 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 Freida Busman did not recommend surgery in multifocal NET and also due to her age and co-morbidities. -She began first line sandostatin injections q28 days on 03/27/22.  -Dotatate PET scan, done 2 weeks after first injection 04/10/22, shows no significant tracer avidity in the pancrease or distant sites. Findings are likely due to close timing  relative to the sandostatin injection blocking receptors. When we repeat this for restaging next year, will arrange 4 weeks after sandostatin -She has persistent anemia, likely secondary to GI bleeding from the tumor and AVM, she has had multiple hospital admission for anemia and bleeding.     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 -She previously had reaction to blood transfusion, or required multiple premedications for blood transfusion -She was recently hospitalized for anemia and a GI bleeding in July 2024. -She is currently on ESA every 2 weeks, IV iron as needed, and blood transfusion as needed. -Due to her multiple hospital admission for anemia and GI bleeding, she started bevacizumab for AVM on 02/12/2023     Assessment and Plan    Neuroendocrine Tumor The patient is receiving bevacizumab infusions biweekly, which have improved her hemoglobin levels to 8.9 without requiring transfusions since February 17. Benefits of bevacizumab in reducing bleeding were discussed, and the plan to reassess infusion frequency after four or five doses was reviewed. - Administer bevacizumab infusion today - Continue bevacizumab infusions every two weeks - Reassess after four or five doses to consider spacing out infusions  Chronic Anemia Secondary to GI Bleeding Chronic anemia is managed with regular monitoring and biweekly anemia injections. Hemoglobin is currently 8.9. No transfusion needed at this time. Future transfusion needs will be based on upcoming blood test results. - Continue monitoring  hemoglobin levels - Administer anemia injection every two weeks - Cancel  scheduled blood transfusion on March 12 - Schedule blood transfusion on March 26 if needed based on blood test results in two weeks  Leg Infection The patient has a red, swollen leg managed by a home health nurse. No fever present. Advised to send a photo to Dr. Shirlee Latch via MyChart for further evaluation. Discussed the importance of proper wound care and potential antibiotic treatment if the infection worsens. - Send a picture of the leg to Dr. Shirlee Latch via MyChart - Follow up with Dr. Shirlee Latch for potential antibiotic treatment  Plan -Lab reviewed, adequate for treatment, will proceed bevacizumab today and continue every 2 weeks for now, will likely change to every months after 4-6 treatments. -Continue Aranesp injection and Sandostatin injection -Follow-up in 2 weeks -No need of blood transfusion this week   Discussed the use of AI scribe software for clinical note transcription with the patient, who gave verbal consent to proceed.  History of Present Illness   The patient, an 89 year old with a history of neuroendocrine tumor and chronic GI bleeding, presents for a routine follow-up. The patient's caregiver reports a new issue of leg redness and swelling, which she suspects might be an infection. The patient's leg was previously healing well with medicated pads applied during hospitalization. However, the patient has been out of these pads for two weeks and has been using Vaseline and gauze for wound care. The patient's caregiver reports that the swelling has reduced, but she is concerned about a potential infection. The patient is also receiving home health care, where a nurse comes to wrap the leg every other day. The patient denies any fever.  The patient's hemoglobin level has improved to 8.9, and she has not required a blood transfusion since February 17. The patient is currently on bevacizumab infusions every two weeks, which has helped slow down the bleeding. The patient also receives an  injection for anemia every two weeks and a sandostatin injection every four weeks.         All other systems were reviewed with the patient and are negative.  MEDICAL HISTORY:  Past Medical History:  Diagnosis Date   (HFpEF) heart failure with preserved ejection fraction (HCC)    EF=60-60%   Actinic keratosis 01/17/2015   R forearm   Adjustment disorder with anxiety    Adverse effect of other narcotics, sequela    Intolerance to all narcotics   Anti-Duffy antibodies present    Aortic valve stenosis    Arthritis    "some in my hands" (11/11/2012)   Atrial fibrillation, permanent (HCC)    Eliquis   Atypical mole 03/25/2018   L forearm - severe   Automatic implantable cardioverter-defibrillator in situ    Avascular necrosis of hip (HCC) 05/03/2011   Carotid artery stenosis 09/2007   a. 09/2007: 60-79% bilateral (stable); b. 10/2008: 40-59% R 60-79%    Cellulitis of left lower extremity 07/13/2020   CKD (chronic kidney disease), stage III (HCC)    COPD (chronic obstructive pulmonary disease) (HCC)    Coronary artery disease    non-obstructive by 2006 cath   COVID-19 virus infection 02/12/2022   Displaced fracture of left femoral neck (HCC) 10/09/2018   GI bleed 03/28/2020   AVM   High cholesterol    Hypertension 05/20/2011   Hypertr obst cardiomyop    Hypotension, unspecified    cardiac cath 2006..nonobstructive CAD 30-40s lesions.Marland KitchenETT 1/09 nondiagnostic due to poor HR response..Right Renal Cancer  2003   Iron deficiency anemia    Long term (current) use of anticoagulants    On home oxygen therapy    3L [place]   Osteoarthritis of right hip    PAD (peripheral artery disease) (HCC)    Pancreatic cancer (HCC)    sandostatin   PONV (postoperative nausea and vomiting)    Presence of permanent cardiac pacemaker    Pulmonary HTN (HCC)    RECTAL BLEEDING 10/13/2009   Qualifier: Diagnosis of  By: Wilmon Pali NP, Paula     Red blood cell antibody positive with compatible PRBC difficult  to obtain    Anti FYA (Duffy a) antibody. Must be transfused with PRB which are Duffy Antigen Negative and Crossmatch Compatible   Renal cell carcinoma (HCC)    s/p nephrectomy   Squamous cell carcinoma of skin 01/17/2015   R lat wrist   Squamous cell carcinoma of skin 01/29/2018   R post upper leg - superficially invasive   Squamous cell carcinoma of skin 03/25/2018   L lat foot   Squamous cell carcinoma of skin 11/09/2020   left lat foot - EDC 01/01/21, recurrent 01/31/21 - MOHs 02/22/21   Squamous cell carcinoma of skin 12/19/2021   Right Posterior Medial Thigh, EDC   Squamous cell carcinoma of skin 12/19/2021   SCC IS, L lat heel, EDC 02/04/2022   Squamous cell carcinoma of skin 01/21/2022   L forearm, EDC 02/04/2022   Squamous cell carcinoma of skin 01/21/2022   R lower leg below knee, EDC 02/04/2022   Squamous cell carcinoma of skin 01/21/2022   SCCIS, R post heel, EDC 02/04/2022   Squamous cell carcinoma of skin 07/01/2022   left lower abdomen, in situ, EDC   Squamous cell carcinoma of skin 07/01/2022   left medial chest, EDC   Urge incontinence    Venous stasis of both lower extremities     SURGICAL HISTORY: Past Surgical History:  Procedure Laterality Date   ABDOMINAL AORTOGRAM W/LOWER EXTREMITY N/A 02/12/2021   Procedure: ABDOMINAL AORTOGRAM W/LOWER EXTREMITY;  Surgeon: Maeola Harman, MD;  Location: [facility] INVASIVE CV LAB;  Service: Cardiovascular;  Laterality: N/A;   ABDOMINAL HYSTERECTOMY  1975   for benign causes   APPENDECTOMY     BI-VENTRICULAR PACEMAKER UPGRADE  05/04/2010   BIOPSY  02/28/2019   Procedure: BIOPSY;  Surgeon: Rachael Fee, MD;  Location: [facility] ENDOSCOPY;  Service: Endoscopy;;   BIOPSY  03/04/2022   Procedure: BIOPSY;  Surgeon: Lemar Lofty., MD;  Location: [facility] ENDOSCOPY;  Service: Gastroenterology;;   CARDIAC CATHETERIZATION  2006   CARDIOVERSION N/A 02/20/2018   Procedure: CARDIOVERSION;  Surgeon: Antonieta Iba, MD;   Location: [facility] ORS;  Service: Cardiovascular;  Laterality: N/A;   CARDIOVERSION N/A 03/27/2018   Procedure: CARDIOVERSION;  Surgeon: Antonieta Iba, MD;  Location: [facility] ORS;  Service: Cardiovascular;  Laterality: N/A;   CATARACT EXTRACTION W/ INTRAOCULAR LENS  IMPLANT, BILATERAL  01/2006-02-2006   CHOLECYSTECTOMY N/A 11/11/2012   Procedure: LAPAROSCOPIC CHOLECYSTECTOMY WITH INTRAOPERATIVE CHOLANGIOGRAM;  Surgeon: Wilmon Arms. Corliss Skains, MD;  Location: [facility] OR;  Service: General;  Laterality: N/A;   COLONOSCOPY WITH PROPOFOL N/A 02/28/2019   Procedure: COLONOSCOPY WITH PROPOFOL;  Surgeon: Rachael Fee, MD;  Location: [facility] ENDOSCOPY;  Service: Endoscopy;  Laterality: N/A;   ENTEROSCOPY N/A 03/30/2020   Procedure: ENTEROSCOPY;  Surgeon: Shellia Cleverly, DO;  Location: [facility] ENDOSCOPY;  Service: Gastroenterology;  Laterality: N/A;   ENTEROSCOPY N/A 02/10/2021   Procedure: ENTEROSCOPY;  Surgeon: Jeani Hawking,  MD;  Location: [facility] ENDOSCOPY;  Service: Endoscopy;  Laterality: N/A;   ENTEROSCOPY N/A 02/15/2022   Procedure: ENTEROSCOPY;  Surgeon: Imogene Burn, MD;  Location: [facility] ENDOSCOPY;  Service: Gastroenterology;  Laterality: N/A;   ENTEROSCOPY N/A 11/06/2022   Procedure: ENTEROSCOPY;  Surgeon: Meridee Score Netty Starring., MD;  Location: [facility] ENDOSCOPY;  Service: Gastroenterology;  Laterality: N/A;   EP IMPLANTABLE DEVICE N/A 02/21/2016   Procedure: ICD Generator Changeout;  Surgeon: Duke Salvia, MD;  Location: [facility] INVASIVE CV LAB;  Service: Cardiovascular;  Laterality: N/A;   ESOPHAGOGASTRODUODENOSCOPY N/A 03/04/2022   Procedure: ESOPHAGOGASTRODUODENOSCOPY (EGD);  Surgeon: Lemar Lofty., MD;  Location: [facility] ENDOSCOPY;  Service: Gastroenterology;  Laterality: N/A;   ESOPHAGOGASTRODUODENOSCOPY (EGD) WITH PROPOFOL N/A 02/28/2019   Procedure: ESOPHAGOGASTRODUODENOSCOPY (EGD) WITH PROPOFOL;  Surgeon: Rachael Fee, MD;  Location: [facility] ENDOSCOPY;  Service: Endoscopy;  Laterality: N/A;   EUS N/A 03/04/2022    Procedure: UPPER ENDOSCOPIC ULTRASOUND (EUS) RADIAL;  Surgeon: Lemar Lofty., MD;  Location: [facility] ENDOSCOPY;  Service: Gastroenterology;  Laterality: N/A;   FINE NEEDLE ASPIRATION N/A 03/04/2022   Procedure: FINE NEEDLE ASPIRATION (FNA) LINEAR;  Surgeon: Lemar Lofty., MD;  Location: [facility] ENDOSCOPY;  Service: Gastroenterology;  Laterality: N/A;   GIVENS CAPSULE STUDY N/A 03/15/2019   Procedure: GIVENS CAPSULE STUDY;  Surgeon: Tressia Danas, MD;  Location: [facility] ENDOSCOPY;  Service: Gastroenterology;  Laterality: N/A;   HOT HEMOSTASIS N/A 03/30/2020   Procedure: HOT HEMOSTASIS (ARGON PLASMA COAGULATION/BICAP);  Surgeon: Shellia Cleverly, DO;  Location: [facility] ENDOSCOPY;  Service: Gastroenterology;  Laterality: N/A;   HOT HEMOSTASIS N/A 02/10/2021   Procedure: HOT HEMOSTASIS (ARGON PLASMA COAGULATION/BICAP);  Surgeon: Jeani Hawking, MD;  Location: [facility] ENDOSCOPY;  Service: Endoscopy;  Laterality: N/A;   HOT HEMOSTASIS N/A 02/15/2022   Procedure: HOT HEMOSTASIS (ARGON PLASMA COAGULATION/BICAP);  Surgeon: Imogene Burn, MD;  Location: [facility] ENDOSCOPY;  Service: Gastroenterology;  Laterality: N/A;   HOT HEMOSTASIS N/A 11/06/2022   Procedure: HOT HEMOSTASIS (ARGON PLASMA COAGULATION/BICAP);  Surgeon: Lemar Lofty., MD;  Location: [facility] ENDOSCOPY;  Service: Gastroenterology;  Laterality: N/A;   INSERT / REPLACE / REMOVE PACEMAKER  05-01-11   02-28-05-/05-04-10-ICD-MEDTRONIC MAXIMAL DR   IR ANGIOGRAM SELECTIVE EACH ADDITIONAL VESSEL  02/18/2022   IR ANGIOGRAM SELECTIVE EACH ADDITIONAL VESSEL  02/18/2022   IR ANGIOGRAM VISCERAL SELECTIVE  02/16/2022   IR ANGIOGRAM VISCERAL SELECTIVE  02/16/2022   IR EMBO ART  VEN HEMORR LYMPH EXTRAV  INC GUIDE ROADMAPPING  02/16/2022   IR THORACENTESIS ASP PLEURAL SPACE W/IMG GUIDE  02/21/2022   IR THORACENTESIS ASP PLEURAL SPACE W/IMG GUIDE  02/22/2022   IR US GUIDE VASC ACCESS RIGHT  02/16/2022   JOINT REPLACEMENT     LAPAROSCOPIC CHOLECYSTECTOMY   11/11/2012   LAPAROSCOPIC LYSIS OF ADHESIONS N/A 11/11/2012   Procedure: LAPAROSCOPIC LYSIS OF ADHESIONS;  Surgeon: Wilmon Arms. Corliss Skains, MD;  Location: [facility] OR;  Service: General;  Laterality: N/A;   NEPHRECTOMY Right 06/2001    S/P RENAL CELL CANCER   PERIPHERAL VASCULAR INTERVENTION Bilateral 02/12/2021   Procedure: PERIPHERAL VASCULAR INTERVENTION;  Surgeon: Maeola Harman, MD;  Location: [facility] INVASIVE CV LAB;  Service: Cardiovascular;  Laterality: Bilateral;  Iliac artery stents   PRESSURE SENSOR/CARDIOMEMS N/A 02/03/2019   Procedure: PRESSURE SENSOR/CARDIOMEMS;  Surgeon: Laurey Morale, MD;  Location: [facility] INVASIVE CV LAB;  Service: Cardiovascular;  Laterality: N/A;   RIGHT HEART CATH N/A 11/09/2018   Procedure: RIGHT HEART CATH;  Surgeon: Laurey Morale, MD;  Location: [facility] INVASIVE CV  LAB;  Service: Cardiovascular;  Laterality: N/A;   RIGHT HEART CATH N/A 03/08/2019   Procedure: RIGHT HEART CATH;  Surgeon: Laurey Morale, MD;  Location: [facility] INVASIVE CV LAB;  Service: Cardiovascular;  Laterality: N/A;   SUBMUCOSAL TATTOO INJECTION  02/28/2019   Procedure: SUBMUCOSAL TATTOO INJECTION;  Surgeon: Rachael Fee, MD;  Location: [facility] ENDOSCOPY;  Service: Endoscopy;;   SUBMUCOSAL TATTOO INJECTION  11/06/2022   Procedure: SUBMUCOSAL TATTOO INJECTION;  Surgeon: Lemar Lofty., MD;  Location: [facility] ENDOSCOPY;  Service: Gastroenterology;;   TOTAL HIP ARTHROPLASTY Right 05/03/2011   Procedure: TOTAL HIP ARTHROPLASTY ANTERIOR APPROACH;  Surgeon: Kathryne Hitch;  Location: [facility] ORS;  Service: Orthopedics;  Laterality: Right;  Removal of Cannulated Screws Right Hip, Right Direct Anterior Hip Replacement   TOTAL HIP ARTHROPLASTY Left 10/09/2018   Procedure: TOTAL HIP ARTHROPLASTY ANTERIOR APPROACH;  Surgeon: Samson Frederic, MD;  Location: [facility] OR;  Service: Orthopedics;  Laterality: Left;    I have reviewed the social history and family history with the patient and they are unchanged from previous  note.  ALLERGIES:  is allergic to codeine, dilaudid [hydromorphone], tape, amoxil [amoxicillin], asa [aspirin], ms contin [morphine], neurontin [gabapentin], and nsaids.  MEDICATIONS:  Current Outpatient Medications  Medication Sig Dispense Refill   acetaminophen (TYLENOL) 500 MG tablet Take 1,000 mg by mouth every 8 (eight) hours.     Ascorbic Acid (VITAMIN C PO) Take 1 tablet by mouth daily.     Cholecalciferol (VITAMIN D-3 PO) Take 2 capsules by mouth daily.     CRANBERRY PO Take 1 tablet by mouth daily.     Cyanocobalamin (B-12 PO) Take 1 tablet by mouth daily.     ferrous gluconate (FERGON) 324 MG tablet Take 1 tablet (324 mg total) by mouth daily with breakfast. 30 tablet 3   fluticasone (FLONASE) 50 MCG/ACT nasal spray Place 2 sprays into both nostrils daily as needed for allergies or rhinitis. 16 g 0   metolazone (ZAROXOLYN) 2.5 MG tablet Take 1 tablet (2.5 mg total) by mouth 2 (two) times a week. Every Tuesday and Saturday with 40 meq of potassium 90 tablet 3   Multiple Vitamins-Minerals (HAIR SKIN NAILS PO) Take 1 capsule by mouth daily.     pantoprazole (PROTONIX) 40 MG tablet Take 1 tablet (40 mg total) by mouth daily. (Patient taking differently: Take 40 mg by mouth daily before breakfast.) 30 tablet 3   potassium chloride 20 MEQ TBCR Take 1 tablet (20 mEq total) by mouth daily. 90 tablet 3   rosuvastatin (CRESTOR) 10 MG tablet Take 1 tablet (10 mg total) by mouth daily. 30 tablet 0   torsemide (DEMADEX) 20 MG tablet Take 5 tablets (100 mg total) by mouth 2 (two) times daily. 250 tablet 3   No current facility-administered medications for this visit.    PHYSICAL EXAMINATION: ECOG PERFORMANCE STATUS: 3 - Symptomatic, >50% confined to bed  Vitals:   07/14/23 1025  BP: 130/62  Pulse: 61  Resp: 18  Temp: 97.8 F (36.6 C)  SpO2: 99%   Wt Readings from Last 3 Encounters:  07/14/23 134 lb 14.4 oz (61.2 kg)  07/10/23 132 lb (59.9 kg)  06/30/23 132 lb 12.8 oz (60.2 kg)      GENERAL:alert, no distress and comfortable SKIN: skin color, texture, turgor are normal, no rashes or significant lesions EYES: normal, Conjunctiva are pink and non-injected, sclera clear NECK: supple, thyroid normal size, non-tender, without nodularity LYMPH:  no palpable lymphadenopathy in the cervical, axillary  LUNGS: clear to auscultation and percussion with normal breathing effort HEART: regular rate & rhythm and no murmurs and no lower extremity edema ABDOMEN:abdomen soft, non-tender and normal bowel sounds Musculoskeletal:no cyanosis of digits and no clubbing  NEURO: alert & oriented x 3 with fluent speech, no focal motor/sensory deficits    LABORATORY DATA:  I have reviewed the data as listed    Latest Ref Rng & Units 07/14/2023    9:46 AM 07/07/2023   10:42 AM 06/30/2023   10:39 AM  CBC  WBC 4.0 - 10.5 K/uL 3.4  4.5  4.3   Hemoglobin 12.0 - 15.0 g/dL 8.9  8.5  8.6   Hematocrit 36.0 - 46.0 % 30.8  28.8  29.7   Platelets 150 - 400 K/uL 200  192  191         Latest Ref Rng & Units 07/10/2023    4:41 PM 06/24/2023    4:21 PM 06/23/2023    3:18 PM  CMP  Glucose 70 - 99 mg/dL 77  295  83   BUN 8 - 23 mg/dL 52  63  63   Creatinine 0.44 - 1.00 mg/dL 6.21  3.08  6.57   Sodium 135 - 145 mmol/L 134  136  137   Potassium 3.5 - 5.1 mmol/L 3.8  3.5  3.9   Chloride 98 - 111 mmol/L 92  92  92   CO2 22 - 32 mmol/L 30  34  32   Calcium 8.9 - 10.3 mg/dL 8.9  9.2  8.7   Total Protein 6.5 - 8.1 g/dL  7.3    Total Bilirubin 0.0 - 1.2 mg/dL  0.7    Alkaline Phos 38 - 126 U/L  58    AST 15 - 41 U/L  19    ALT 0 - 44 U/L  13        RADIOGRAPHIC STUDIES: I have personally reviewed the radiological images as listed and agreed with the findings in the report. No results found.    No orders of the defined types were placed in this encounter.  All questions were answered. The patient knows to call the clinic with any problems, questions or concerns. No barriers to learning was  detected. The total time spent in the appointment was 25 minutes.     Malachy Mood, MD 07/14/2023

## 2023-07-14 NOTE — Patient Instructions (Signed)
 Bevacizumab Injection What is this medication? BEVACIZUMAB (be va SIZ yoo mab) treats some types of cancer. It works by blocking a protein that causes cancer cells to grow and multiply. This helps to slow or stop the spread of cancer cells. It is a monoclonal antibody. This medicine may be used for other purposes; ask your health care provider or pharmacist if you have questions. COMMON BRAND NAME(S): Alymsys, Avastin, MVASI, Rosaland Lao What should I tell my care team before I take this medication? They need to know if you have any of these conditions: Blood clots Coughing up blood Having or recent surgery Heart failure High blood pressure History of a connection between 2 or more body parts that do not usually connect (fistula) History of a tear in your stomach or intestines Protein in your urine An unusual or allergic reaction to bevacizumab, other medications, foods, dyes, or preservatives Pregnant or trying to get pregnant Breast-feeding How should I use this medication? This medication is injected into a vein. It is given by your care team in a hospital or clinic setting. Talk to your care team the use of this medication in children. Special care may be needed. Overdosage: If you think you have taken too much of this medicine contact a poison control center or emergency room at once. NOTE: This medicine is only for you. Do not share this medicine with others. What if I miss a dose? Keep appointments for follow-up doses. It is important not to miss your dose. Call your care team if you are unable to keep an appointment. What may interact with this medication? Interactions are not expected. This list may not describe all possible interactions. Give your health care provider a list of all the medicines, herbs, non-prescription drugs, or dietary supplements you use. Also tell them if you smoke, drink alcohol, or use illegal drugs. Some items may interact with your medicine. What  should I watch for while using this medication? Your condition will be monitored carefully while you are receiving this medication. You may need blood work while taking this medication. This medication may make you feel generally unwell. This is not uncommon as chemotherapy can affect healthy cells as well as cancer cells. Report any side effects. Continue your course of treatment even though you feel ill unless your care team tells you to stop. This medication may increase your risk to bruise or bleed. Call your care team if you notice any unusual bleeding. Before having surgery, talk to your care team to make sure it is ok. This medication can increase the risk of poor healing of your surgical site or wound. You will need to stop this medication for 28 days before surgery. After surgery, wait at least 28 days before restarting this medication. Make sure the surgical site or wound is healed enough before restarting this medication. Talk to your care team if questions. Talk to your care team if you may be pregnant. Serious birth defects can occur if you take this medication during pregnancy and for 6 months after the last dose. Contraception is recommended while taking this medication and for 6 months after the last dose. Your care team can help you find the option that works for you. Do not breastfeed while taking this medication and for 6 months after the last dose. This medication can cause infertility. Talk to your care team if you are concerned about your fertility. What side effects may I notice from receiving this medication? Side effects that you should  report to your care team as soon as possible: Allergic reactions--skin rash, itching, hives, swelling of the face, lips, tongue, or throat Bleeding--bloody or black, tar-like stools, vomiting blood or brown material that looks like coffee grounds, red or dark brown urine, small red or purple spots on skin, unusual bruising or bleeding Blood  clot--pain, swelling, or warmth in the leg, shortness of breath, chest pain Heart attack--pain or tightness in the chest, shoulders, arms, or jaw, nausea, shortness of breath, cold or clammy skin, feeling faint or lightheaded Heart failure--shortness of breath, swelling of the ankles, feet, or hands, sudden weight gain, unusual weakness or fatigue Increase in blood pressure Infection--fever, chills, cough, sore throat, wounds that don't heal, pain or trouble when passing urine, general feeling of discomfort or being unwell Infusion reactions--chest pain, shortness of breath or trouble breathing, feeling faint or lightheaded Kidney injury--decrease in the amount of urine, swelling of the ankles, hands, or feet Stomach pain that is severe, does not go away, or gets worse Stroke--sudden numbness or weakness of the face, arm, or leg, trouble speaking, confusion, trouble walking, loss of balance or coordination, dizziness, severe headache, change in vision Sudden and severe headache, confusion, change in vision, seizures, which may be signs of posterior reversible encephalopathy syndrome (PRES) Side effects that usually do not require medical attention (report to your care team if they continue or are bothersome): Back pain Change in taste Diarrhea Dry skin Increased tears Nosebleed This list may not describe all possible side effects. Call your doctor for medical advice about side effects. You may report side effects to FDA at 1-800-FDA-1088. Where should I keep my medication? This medication is given in a hospital or clinic. It will not be stored at home. NOTE: This sheet is a summary. It may not cover all possible information. If you have questions about this medicine, talk to your doctor, pharmacist, or health care provider.  2024 Elsevier/Gold Standard (2021-09-07 00:00:00)

## 2023-07-14 NOTE — Patient Outreach (Signed)
 Care Coordination   07/14/2023 Name: [name] MRN: [id number] DOB: 02-Dec-1933   Care Coordination Outreach Attempts:  An unsuccessful outreach was attempted for an appointment today. Unable to reach patient or leave voice message due to phone only ringing.   Follow Up Plan:  Additional outreach attempts will be made to offer the patient complex care management information and services.   Encounter Outcome:  No Answer  Will request care guide to attempt telephone outreach.   Care Coordination Interventions:  No, not indicated    [name], BSN, CCM [place]  [facility], Population Health Case Manager Phone: [phone]

## 2023-07-16 ENCOUNTER — Telehealth: Payer: Self-pay

## 2023-07-16 ENCOUNTER — Encounter: Payer: Self-pay | Admitting: Hematology

## 2023-07-16 ENCOUNTER — Encounter: Payer: Self-pay | Admitting: Oncology

## 2023-07-16 ENCOUNTER — Inpatient Hospital Stay: Payer: PPO

## 2023-07-16 NOTE — Telephone Encounter (Addendum)
 Ok to use xeroform Can they send a picture through mychart?  Would offer OV for evaluation of recurrent cellulitis. I'm not in office tomorrow - but I could see Friday at 12:15pm if they desire however if concern for infection rec eval as soon as possible

## 2023-07-16 NOTE — Addendum Note (Signed)
 Addended by: Geralyn Flash D on: 07/16/2023 04:21 PM   Modules accepted: Orders

## 2023-07-16 NOTE — Progress Notes (Signed)
 Remote ICD transmission.

## 2023-07-16 NOTE — Telephone Encounter (Signed)
 Copied from CRM [id number]. Topic: Clinical - Home Health Verbal Orders >> Jul 16, 2023  3:17 PM [place] J wrote: Caller/Agency: Lissa Hoard / [facility] Callback Number: [phone] Voicemail box is confidential. Service Requested: Wound care. Frequency: Every other day. / Xeroform is wanting to be used. Any new concerns about the patient? Yes Concerned about cellulitis coming back in patient's left lower extremity.

## 2023-07-17 NOTE — Telephone Encounter (Signed)
 Lvm asking Lissa Hoard, of [facility], to call back. Need to relay Dr Timoteo Expose message.

## 2023-07-18 DIAGNOSIS — S81801A Unspecified open wound, right lower leg, initial encounter: Secondary | ICD-10-CM | POA: Diagnosis not present

## 2023-07-18 DIAGNOSIS — I87311 Chronic venous hypertension (idiopathic) with ulcer of right lower extremity: Secondary | ICD-10-CM | POA: Diagnosis not present

## 2023-07-18 DIAGNOSIS — L97811 Non-pressure chronic ulcer of other part of right lower leg limited to breakdown of skin: Secondary | ICD-10-CM | POA: Diagnosis not present

## 2023-07-18 NOTE — Telephone Encounter (Signed)
 Lvm asking Lissa Hoard, of [facility], to call back. Need to relay Dr Timoteo Expose message.

## 2023-07-18 NOTE — Telephone Encounter (Signed)
 [name] rtn call. Informed her Dr Reece Agar is ok with the use of Xeroform for pt. She verbalizes understanding. States she's keeping an eye on pt's legs. No warmth to the area. [name] has discussed signs/sxs of cellulitis with pt. Says she will stop by again to check on pt. Will have pt contact office if cellulitis concerns.

## 2023-07-21 ENCOUNTER — Inpatient Hospital Stay: Payer: PPO

## 2023-07-21 DIAGNOSIS — D500 Iron deficiency anemia secondary to blood loss (chronic): Secondary | ICD-10-CM

## 2023-07-21 DIAGNOSIS — D649 Anemia, unspecified: Secondary | ICD-10-CM

## 2023-07-21 DIAGNOSIS — D3A8 Other benign neuroendocrine tumors: Secondary | ICD-10-CM

## 2023-07-21 DIAGNOSIS — Z5112 Encounter for antineoplastic immunotherapy: Secondary | ICD-10-CM | POA: Diagnosis not present

## 2023-07-21 DIAGNOSIS — K5520 Angiodysplasia of colon without hemorrhage: Secondary | ICD-10-CM

## 2023-07-21 LAB — CBC WITH DIFFERENTIAL (CANCER CENTER ONLY)
Abs Immature Granulocytes: 0.01 10*3/uL (ref 0.00–0.07)
Basophils Absolute: 0 10*3/uL (ref 0.0–0.1)
Basophils Relative: 1 %
Eosinophils Absolute: 0 10*3/uL (ref 0.0–0.5)
Eosinophils Relative: 0 %
HCT: 29 % — ABNORMAL LOW (ref 36.0–46.0)
Hemoglobin: 8.5 g/dL — ABNORMAL LOW (ref 12.0–15.0)
Immature Granulocytes: 0 %
Lymphocytes Relative: 9 %
Lymphs Abs: 0.4 10*3/uL — ABNORMAL LOW (ref 0.7–4.0)
MCH: 24.6 pg — ABNORMAL LOW (ref 26.0–34.0)
MCHC: 29.3 g/dL — ABNORMAL LOW (ref 30.0–36.0)
MCV: 83.8 fL (ref 80.0–100.0)
Monocytes Absolute: 0.5 10*3/uL (ref 0.1–1.0)
Monocytes Relative: 12 %
Neutro Abs: 3.3 10*3/uL (ref 1.7–7.7)
Neutrophils Relative %: 78 %
Platelet Count: 205 10*3/uL (ref 150–400)
RBC: 3.46 MIL/uL — ABNORMAL LOW (ref 3.87–5.11)
RDW: 19.6 % — ABNORMAL HIGH (ref 11.5–15.5)
WBC Count: 4.3 10*3/uL (ref 4.0–10.5)
nRBC: 0 % (ref 0.0–0.2)

## 2023-07-23 ENCOUNTER — Telehealth: Payer: Self-pay | Admitting: Family

## 2023-07-23 NOTE — Telephone Encounter (Signed)
 Unable to confirm appt phone gives busy signal

## 2023-07-23 NOTE — Progress Notes (Deleted)
 ADVANCED HF CLINIC NOTE    PCP:  [name], MD (last seen 10/24) Cardiologist:  Sherryl Manges, MD (last seen 11/24) Primary HF: Dr. Shirlee Latch (last seen 12/24)  Chief Complaint:    History of Present Illness:  [name] is a 89 y.o. female with a history of permanent atrial fibrillation, chronic diastolic CHF, CKD stage 3, smoking/prior COPD, renal cell CA s/p right nephrectomy. She has a prior history of HFrEF with nonischemic cardiomyopathy and had a Medtronic ICD placed. However, subsequently her EF has recovered.    06/20, she had left hip ORIF after mechanical fall.  Echo in 06/20 showed EF 60-65%, mild RV dilation with normal systolic function, PASP 93 mmHg.  After she got home from the hospital stay post-ORIF, she noted marked peripheral edema. She was short of breath walking short distances.  +orthopnea. She came back to the hospital 07/20 and was admitted with volume overload. She was diuresed aggressively and lost about 22 lbs. RHC showed pulmonary venous hypertension. PCWP tracing had prominent v-waves in absence of significant MR, suggesting significant diastolic dysfunction. Noted to have significant Fe deficiency anemia, but FOBT was negative during 7/20 hospitalization.   Admitted 11/20 with symptomatic anemia, GI workup concerning for ischemic bowel. 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 Durwin Nora with VVS and conservative management recommended. Capsule endoscopy showed no definite source of bleeding. She was volume overloaded due to significant RV dysfunction and diuresed, she also developed AKI on CKD stage 3.      10/22, she had bilateral CIA stents placed. ABIs in 11/22 showed patent stents. She had a skin cancer removed from her left leg. 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   Echo 2/23 EF 65-70% with mild LVH, D-shaped septum with moderate RV enlargement, and mildly decreased RV function, PASP 92,  moderate-severe TR, dilated IVC, moderate aortic stenosis. Echo in 10/23 showed EF 60-65%, D-shaped septum, mild RV enlargement with normal RV systolic function, PASP 90, moderate-severe TR, mild AS, IVC dilated.   Patient was found to have a neuroendocrine tumor of the pancreatic head in the fall of 2023.  She has been treated with sandostatin monthly.  She also had further GI bleeding from small bowel AVMs in fall 2023. She also had COVID-19 in 10/23 and has been on home oxygen 2L since that time.   Follow up with Dr. Graciela Husbands, long discussion about EOL. Given DNR form and high-voltage therapies inactivated on ICD.  Admit 07/24 with acute on chronic blood loss anemia 2/2 GI bleed. Transfused and given IV iron. Had small bowel enteroscopy which showed 2 nonbleeding angiodysplastic lesions in duodenum and 3 nonbleeding angioplastic lesions in jejunum treated with APC.  Admitted early 09/24 with acute on chronic anemia suspected 2/2 chronic GI blood loss. Hgb down to 6.8. Received 2 u RBCs. GI did not recommend any endoscopic procedures d/t advanced age and comorbidities. Course further c/b acute on chronic CHF. She was diuresed with IV lasix and metolazone. Echo during admit with LVEF 65-70%, D-shaped septum, mildly reduced RV, RVSP 103 mmHg, biatrial enlargement, severe TR, moderate mitral stenosis with mean gradient of 6 mmHg, dilated IVC.  She was seen back in the [facility] on 01/30/23 for post hospital f/u and noted recurrent melena, SOB and fatigued w/ minimal exertion. Also noted to have recurrent volume overload w/ marked return of LEE. Hgb was down to 6.6. She was sent back to the ED and readmitted  again for a/c CHF and GIB, treated w/ blood transfusions, IV Fe and diuretics. Discharged on 02/06/23 on torsemide 100 mg bid.   S/p 2u RBCs 04/26/23 for symptomatic anemia. Hgb 6.8.   Admitted 05/27/23 due to shortness of breath/ leg swelling. Initially given IV lasix  with removal of ~ 4L and transitioned to  oral torsemide at 100mg  BID at discharge. Metolazone decreased to weekly. Treated with antibiotics for leg cellulitis. Given 1 unit PRBC's due to hemoglobin trending down to 7.1. Wound care consulted due to weeping area on left shin. Palliative care also consulted.   She was admitted in 2/25 with lower leg cellulitis.  She was treated with IV abx.   She presents today, with her niece, for a HF follow-up visit with a chief complaint of   She has not been using her Cardiomems, unable to lie on the pillow. At last visit, torsemide was increased to 100mg  BID along with the addition of potassium   PMH: 1. COPD 2. Renal cell carcinoma: S/p right kidney resection.  3. CKD stage 3 4. Fe deficiency anemia 5. Atrial fibrillation: Permanent. She has failed amiodarone, Tikosyn, and flecainide as well as multiple cardioversions. 6. Chronic diastolic CHF: She has history of prior HFrEF with nonischemic cardiomyopathy.  She has a Medtronic ICD.   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  - Echo (6/20): EF 60-65%, mildly dilated RV with PASP 93 mmHg, mild-moderate TR.  - RHC (7/20): mean RA 11, PA 64/18 mean 39, mean PCWP 24 with v waves to 41, CI 4.21, PVR 1.9 WU.  - Cardiomems placement - RHC (11/20): mean RA 13, PA 63/23 mean 25 with v waves to 47 (MR only mild by echo), mean PCWP 25, CI 3.18, PVR 2.6 WU - Echo (11/21): EF 65-70%, moderate LVH, D-shaped septum with mildly decreased RV systolic function, RVSP 85.9 mmHg, severe RAE, severe TR, mod-sev AS. - Echo (2/23): EF 65-70% with mild LVH, D-shaped septum with moderate RV enlargement, and mildly decreased RV function, PASP 92, moderate-severe TR, dilated IVC, moderate aortic stenosis.  - Echo (10/23): EF 60-65%, D-shaped septum, mild RV enlargement with normal RV systolic function, PASP 90, moderate-severe TR, mild AS, IVC dilated.  - Echo (9/24): LVEF 65-70%, D-shaped septum, mildly reduced RV, RVSP 103 mmHg, biatrial enlargement, severe TR, moderate  mitral stenosis with mean gradient of 6 mmHg, dilated IVC - Echo (01/25): EF 65-70%, mild LVH, RV moderately reduced, severely elevated PA pressure 83.9 mmHg, mild MR, moderate [name] with mean gradient of 6.0 mmHg, IVC dialted 7. Hyperlipidemia 8. Carotid stenosis: Carotid dopplers (3/19) with 40-59% LICA stenosis.   9. Possible ischemic bowel in 11/20 10. 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 Conservative management per Dr. Durwin Nora.  - 10/22 bilateral CIA stents.  - 11/22 ABIs: 1.07 left, 0.83 right with patent CIA stents.  11. 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  12. Squamous cell skin cancer.  13. 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  14. COVID 19 in 10/23    Current Outpatient Medications on File Prior to Visit  Medication Sig Dispense Refill   acetaminophen (TYLENOL) 500 MG tablet Take 1,000 mg by mouth every 8 (eight) hours.     Ascorbic Acid (VITAMIN C PO) Take 1 tablet by mouth daily.     Cholecalciferol (VITAMIN D-3 PO) Take 2 capsules by mouth daily.     CRANBERRY PO Take 1 tablet by mouth daily.  Cyanocobalamin (B-12 PO) Take 1 tablet by mouth daily.     ferrous gluconate (FERGON) 324 MG tablet Take 1 tablet (324 mg total) by mouth daily with breakfast. 30 tablet 3   fluticasone (FLONASE) 50 MCG/ACT nasal spray Place 2 sprays into both nostrils daily as needed for allergies or rhinitis. 16 g 0   metolazone (ZAROXOLYN) 2.5 MG tablet Take 1 tablet (2.5 mg total) by mouth 2 (two) times a week. Every Tuesday and Saturday with 40 meq of potassium 90 tablet 3   Multiple Vitamins-Minerals (HAIR SKIN NAILS PO) Take 1 capsule by mouth daily.     pantoprazole (PROTONIX) 40 MG tablet Take 1 tablet (40 mg total) by mouth daily. (Patient taking differently: Take 40 mg by mouth daily before breakfast.) 30 tablet 3   potassium chloride 20 MEQ TBCR Take 1 tablet (20 mEq total) by mouth daily. 90  tablet 3   rosuvastatin (CRESTOR) 10 MG tablet Take 1 tablet (10 mg total) by mouth daily. 30 tablet 0   torsemide (DEMADEX) 20 MG tablet Take 5 tablets (100 mg total) by mouth 2 (two) times daily. 250 tablet 3   No current facility-administered medications on file prior to visit.     Allergies:   Dilaudid [hydromorphone], Fentanyl, Codeine, Morphine and related, Amoxicillin, Hydrocodone, Oxycodone, Tramadol, Whole blood, and Sulfonamide derivatives    Social History:  The patient  reports that she quit smoking about 21 years ago. Her smoking use included cigarettes. She has a 20.00 pack-year smoking history. She has never used smokeless tobacco. She reports that she does not drink alcohol and does not use drugs.    Family History:  The patient's family history includes Breast cancer in her cousin and another family member; Breast cancer (age of onset: 26) in her maternal aunt; Early death in her father; Heart failure in her mother.    ROS:  Please see the history of present illness.   All other systems are personally reviewed and negative.       PHYSICAL EXAM:  General: NAD, frail Neck: JVP 10-11 cm with HJR, no thyromegaly or thyroid nodule.  Lungs: Decrease BS right base.  CV: Nondisplaced PMI.  Heart irregular S1/S2, no S3/S4, 2/6 HSM LLSB.  1+ edema to knees.  No carotid bruit.  Normal pedal pulses.  Abdomen: Soft, nontender, no hepatosplenomegaly, no distention.  Skin: Intact without lesions or rashes.  Neurologic: Alert and oriented x 3.  Psych: Normal affect. Extremities: No clubbing or cyanosis.  HEENT: Normal.   ASSESSMENT AND PLAN:  1: NICM with preserved EF with prominent RV failure: - ? Due to atrial fibrillation - NYHA class - euvolemic  - Echo in 6/20 showed EF 60-65%, mild RV dilation with normal systolic function, PASP 93 mmHg.   - has Medtronic ICD.  - RHC 11/20 showed elevated filling pressures and pulmonary venous hypertension. Prominent v-waves on PCWP  tracing likely due to stiff ventricle/diastolic dysfunction as she had only mild MR on echo.  - Echo 10/23 with EF 60-65%, D-shaped septum, mild RV enlargement with normal RV systolic function, PASP 90, moderate-severe TR, mild AS, IVC dilated. - Echo in 9/242 with LVEF 65-70%, D-shaped septum, mildly reduced RV, RVSP 103 mmHg, biatrial enlargement, severe TR, mild AS with mean gradient of 17 mmhg, moderate mitral stenosis with mean gradient of 6 mmHg, dilated IVC. She is RV pacing at a high percentage, but LV EF has remained normal.  - weight 132 pounds from last visit here 2 weeks  ago - Continue metolazone 2.5 mg twice a week.  - Continue torsemide 100 mg bid / KCl 20 daily (40 on metolazone days).  - BMET today - Off SGLT2i after UTIs  2. Hypertension:  - BP  - BMET 07/10/23 reviewed and showed sodium 134, potassium 3.8, creatinine 1.7 & GFR 29 - BMET today  3. Pulmonary hypertension:   - Based on RHC 11/20, she has primarily pulmonary venous hypertension - not candidate for pulmonary vasodilators.   4. CKD: Stage 4.  - Off SGLT2i.   - BMET today.   5. Chronic anemia/GI bleeding:  - Due to small bowel AVMs. She has had transfusions and IV Fe.  - She is now off anticoagulation.   6. Atrial fibrillation: Permanent.  - Rate control is reasonable off nodal blockers. She has failed amiodarone, Tikosyn, and flecainide as well as multiple cardioversions.  - Off Eliquis with recurrent GI bleeding and transfusions.  - Not Watchman candidate due to frailty and poorly compensated CHF.   7. Neuroendocrine tumor of pancreatic head - Treating with bevacizumab.      [name], 07/23/23

## 2023-07-24 ENCOUNTER — Encounter: Payer: Self-pay | Admitting: Cardiology

## 2023-07-24 ENCOUNTER — Ambulatory Visit: Attending: Cardiology | Admitting: Cardiology

## 2023-07-24 ENCOUNTER — Encounter: Admitting: Family

## 2023-07-24 VITALS — BP 140/51 | HR 60 | Wt 135.8 lb

## 2023-07-24 DIAGNOSIS — N184 Chronic kidney disease, stage 4 (severe): Secondary | ICD-10-CM | POA: Diagnosis not present

## 2023-07-24 DIAGNOSIS — I428 Other cardiomyopathies: Secondary | ICD-10-CM | POA: Diagnosis not present

## 2023-07-24 MED ORDER — POTASSIUM CHLORIDE ER 20 MEQ PO TBCR: 20.0000 meq | EXTENDED_RELEASE_TABLET | Freq: Every day | ORAL | 2 refills | Status: DC

## 2023-07-24 MED ORDER — METOLAZONE 2.5 MG PO TABS: 2.5000 mg | ORAL_TABLET | ORAL | 3 refills | Status: DC

## 2023-07-24 NOTE — Progress Notes (Incomplete)
 [facility] - HEART FAILURE CLINIC - PHARMACIST COUNSELING NOTE  Guideline-Directed Medical Therapy/Evidence Based Medicine  ACE/ARB/ARNI: {AR_ARNI/ACEi/ARB:25599} Beta Blocker: {AR_Beta blocker:25598} Aldosterone Antagonist: {AR_Mineralocorticoid receptor antagonists:25602} Diuretic: {AR_Diuretics:25604} SGLT2i: {AR_SGLT2i:25603}  Adherence Assessment  Do you ever forget to take your medication? [] Yes [] No  Do you ever skip doses due to side effects? [] Yes [] No  Do you have trouble affording your medicines? [] Yes [] No  Are you ever unable to pick up your medication due to transportation difficulties? [] Yes [] No  Do you ever stop taking your medications because you don't believe they are helping? [] Yes [] No  Do you check your weight daily? [] Yes [] No   Adherence strategy: *** Barriers to obtaining medications: ***  Vital signs: HR ***, BP ***, weight (pounds) *** ECHO: Date ***, EF ***, notes *** Cath: Date ***, EF ***, notes ***     Latest Ref Rng & Units 07/10/2023    4:41 PM 06/24/2023    4:21 PM 06/23/2023    3:18 PM  BMP  Glucose 70 - 99 mg/dL 77  409  83   BUN 8 - 23 mg/dL 52  63  63   Creatinine 0.44 - 1.00 mg/dL 8.11  9.14  7.82   BUN/Creat Ratio 6 - 22 (calc)   30   Sodium 135 - 145 mmol/L 134  136  137   Potassium 3.5 - 5.1 mmol/L 3.8  3.5  3.9   Chloride 98 - 111 mmol/L 92  92  92   CO2 22 - 32 mmol/L 30  34  32   Calcium 8.9 - 10.3 mg/dL 8.9  9.2  8.7    Past Medical History:  Diagnosis Date   (HFpEF) heart failure with preserved ejection fraction (HCC)    EF=60-60%   Actinic keratosis 01/17/2015   R forearm   Adjustment disorder with anxiety    Adverse effect of other narcotics, sequela    Intolerance to all narcotics   Anti-Duffy antibodies present    Aortic valve stenosis    Arthritis    "some in my hands" (11/11/2012)   Atrial fibrillation, permanent (HCC)    Eliquis   Atypical mole 03/25/2018   L forearm - severe    Automatic implantable cardioverter-defibrillator in situ    Avascular necrosis of hip (HCC) 05/03/2011   Carotid artery stenosis 09/2007   a. 09/2007: 60-79% bilateral (stable); b. 10/2008: 40-59% R 60-79%    Cellulitis of left lower extremity 07/13/2020   CKD (chronic kidney disease), stage III (HCC)    COPD (chronic obstructive pulmonary disease) (HCC)    Coronary artery disease    non-obstructive by 2006 cath   COVID-19 virus infection 02/12/2022   Displaced fracture of left femoral neck (HCC) 10/09/2018   GI bleed 03/28/2020   AVM   High cholesterol    Hypertension 05/20/2011   Hypertr obst cardiomyop    Hypotension, unspecified    cardiac cath 2006..nonobstructive CAD 30-40s lesions.Marland KitchenETT 1/09 nondiagnostic due to poor HR response..Right Renal Cancer 2003   Iron deficiency anemia    Long term (current) use of anticoagulants    On home oxygen therapy    3L [place]   Osteoarthritis of right hip    PAD (peripheral artery disease) (HCC)    Pancreatic cancer (HCC)    sandostatin   PONV (postoperative nausea and vomiting)    Presence of permanent cardiac pacemaker    Pulmonary HTN (HCC)    RECTAL BLEEDING 10/13/2009   Qualifier: Diagnosis of  By:  Wilmon Pali NP, Paula     Red blood cell antibody positive with compatible PRBC difficult to obtain    Anti FYA (Duffy a) antibody. Must be transfused with PRB which are Duffy Antigen Negative and Crossmatch Compatible   Renal cell carcinoma (HCC)    s/p nephrectomy   Squamous cell carcinoma of skin 01/17/2015   R lat wrist   Squamous cell carcinoma of skin 01/29/2018   R post upper leg - superficially invasive   Squamous cell carcinoma of skin 03/25/2018   L lat foot   Squamous cell carcinoma of skin 11/09/2020   left lat foot - EDC 01/01/21, recurrent 01/31/21 - MOHs 02/22/21   Squamous cell carcinoma of skin 12/19/2021   Right Posterior Medial Thigh, EDC   Squamous cell carcinoma of skin 12/19/2021   SCC IS, L lat heel, EDC 02/04/2022    Squamous cell carcinoma of skin 01/21/2022   L forearm, EDC 02/04/2022   Squamous cell carcinoma of skin 01/21/2022   R lower leg below knee, EDC 02/04/2022   Squamous cell carcinoma of skin 01/21/2022   SCCIS, R post heel, EDC 02/04/2022   Squamous cell carcinoma of skin 07/01/2022   left lower abdomen, in situ, EDC   Squamous cell carcinoma of skin 07/01/2022   left medial chest, EDC   Urge incontinence    Venous stasis of both lower extremities    ASSESSMENT *** year old {Gender Description:[id number]} who presents to the HF clinic ***  Recent ED Visit (past 6 months): Date - ***, CC - ***  COUNSELING POINTS/CLINICAL PEARLS  PLAN     Time spent: *** minutes  Littie Deeds, PharmD Pharmacy Resident  07/24/2023 12:39 PM  Current Outpatient Medications:    acetaminophen (TYLENOL) 500 MG tablet, Take 1,000 mg by mouth every 8 (eight) hours., Disp: , Rfl:    Ascorbic Acid (VITAMIN C PO), Take 1 tablet by mouth daily., Disp: , Rfl:    Cholecalciferol (VITAMIN D-3 PO), Take 2 capsules by mouth daily., Disp: , Rfl:    CRANBERRY PO, Take 1 tablet by mouth daily., Disp: , Rfl:    Cyanocobalamin (B-12 PO), Take 1 tablet by mouth daily., Disp: , Rfl:    ferrous gluconate (FERGON) 324 MG tablet, Take 1 tablet (324 mg total) by mouth daily with breakfast., Disp: 30 tablet, Rfl: 3   fluticasone (FLONASE) 50 MCG/ACT nasal spray, Place 2 sprays into both nostrils daily as needed for allergies or rhinitis., Disp: 16 g, Rfl: 0   metolazone (ZAROXOLYN) 2.5 MG tablet, Take 1 tablet (2.5 mg total) by mouth 2 (two) times a week. Every Tuesday and Saturday with 40 meq of potassium, Disp: 90 tablet, Rfl: 3   Multiple Vitamins-Minerals (HAIR SKIN NAILS PO), Take 1 capsule by mouth daily., Disp: , Rfl:    pantoprazole (PROTONIX) 40 MG tablet, Take 1 tablet (40 mg total) by mouth daily. (Patient taking differently: Take 40 mg by mouth daily before breakfast.), Disp: 30 tablet, Rfl: 3   potassium  chloride 20 MEQ TBCR, Take 1 tablet (20 mEq total) by mouth daily., Disp: 90 tablet, Rfl: 3   rosuvastatin (CRESTOR) 10 MG tablet, Take 1 tablet (10 mg total) by mouth daily., Disp: 30 tablet, Rfl: 0   torsemide (DEMADEX) 20 MG tablet, Take 5 tablets (100 mg total) by mouth 2 (two) times daily., Disp: 250 tablet, Rfl: 3

## 2023-07-24 NOTE — Patient Instructions (Addendum)
 Medication Changes:  INCREASE Metolazone 2.5mg  (1 tab) three times weekly. (Every Tuesday, Thursday, and Saturday)  EXTRA Take (2 tabs) of Potassium on Tuesday, Thursday, and Saturday   Follow-Up in: Please follow up with the [facility] in 2-3 weeks with Dr. Shirlee Latch.  At the [facility], you and your health needs are our priority. We have a designated team specialized in the treatment of Heart Failure. This Care Team includes your primary Heart Failure Specialized Cardiologist (physician), Advanced Practice Providers (APPs- Physician Assistants and Nurse Practitioners), and Pharmacist who all work together to provide you with the care you need, when you need it.   You may see any of the following providers on your designated Care Team at your next follow up:  Dr. Arvilla Meres Dr. Marca Ancona Dr. Dorthula Nettles Dr. Theresia Bough Clarisa Kindred, FNP Enos Fling, RPH-CPP  Please be sure to bring in all your medications bottles to every appointment.   Need to Contact us:  If you have any questions or concerns before your next appointment please send [place] a message through [place] or call our office at [phone].    TO LEAVE A MESSAGE FOR THE NURSE SELECT OPTION 2, PLEASE LEAVE A MESSAGE INCLUDING: YOUR NAME DATE OF BIRTH CALL BACK NUMBER REASON FOR CALL**this is important as we prioritize the call backs  YOU WILL RECEIVE A CALL BACK THE SAME DAY AS LONG AS YOU CALL BEFORE 4:00 PM

## 2023-07-27 DIAGNOSIS — J449 Chronic obstructive pulmonary disease, unspecified: Secondary | ICD-10-CM | POA: Diagnosis not present

## 2023-07-27 DIAGNOSIS — I5033 Acute on chronic diastolic (congestive) heart failure: Secondary | ICD-10-CM | POA: Diagnosis not present

## 2023-07-27 NOTE — Progress Notes (Unsigned)
 Patient Care Team: [name], MD as PCP - General (Family Medicine) Laurey Morale, MD as PCP - Advanced Heart Failure (Cardiology) Antonieta Iba, MD as PCP - Cardiology (Cardiology) Duke Salvia, MD as PCP - Electrophysiology (Cardiology) Antonieta Iba, MD as Consulting Physician (Cardiology) Duke Salvia, MD as Consulting Physician (Cardiology) [name], Latina Craver, MD as Consulting Physician (Vascular Surgery) Creig Hines, MD as Consulting Physician (Hematology and Oncology) Malachy Mood, MD as Consulting Physician (Oncology) Kathyrn Sheriff, [facility] (Inactive) as Pharmacist (Pharmacist) Otho Ket, RN as VBCI Care Management  Clinic Day:  07/28/2023  Referring physician: Malachy Mood, MD  ASSESSMENT & PLAN:   Assessment & Plan: 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   -Outpatient EUS 03/04/2022 by Dr. 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 Freida Busman did not recommend surgery in multifocal NET and also due to her age and co-morbidities. -She began first line sandostatin injections q28 days on 03/27/22.  -Dotatate PET scan, done 2 weeks after first injection 04/10/22, shows no significant tracer avidity in the pancrease or distant sites. Findings are likely due to close timing relative to the sandostatin injection blocking receptors. 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   -continue with aranesp injection and bevacizumab every other week.  --check labs weekly and administer blood transfusions as needed.  --07/28/2023 - no blood transfusion needed this week.    Normocytic anemia Patient currently receiving Aranesp injections every 2 weeks.  Blood counts improving.  Has not needed blood transfusion since 06/23/2023. -07/28/2023 -Hgb 8.2.  Blood transfusion not needed this week.  Recheck labs in 1 week.  Add ferritin. -Continue Aranesp injections every 2 weeks.  Blood transfusions as needed.  2 units PRBCs for hemoglobin <7.0.  1 unit PRBCs for Hgb between 7 and 8.   Chronic anemia secondary to GI bleeding Hemoglobin 8.2 today.  Being managed with biweekly Aranesp injections.  Blood transfusion currently not needed.  Treatment parameters for blood transfusions are for 2 units of PRBCs for Hgb <7, and for 1 unit PRBCs for Hgb between 7 and 8.  Will check ferritin levels with next lab draw and treat with IV iron as needed. -Cancel blood transfusion appointment for March 26. -New appointment for blood transfusion to be scheduled in April 3 if needed. -Aranesp injection to be given today.  Neuroendocrine tumor Patient scheduled to have bevacizumab infusion today.  She is getting octreotide monthly.  She is also getting Aranesp every 2 weeks.  Hgb has stabilized at >8.0.  She has not required blood transfusion since June 23, 2023.  As hemoglobin is as hemoglobin is slowly dropping, will continue with weekly lab monitoring.  Will add ferritin level to next lab draw.  Congestive heart failure Patient recently saw cardiology.  Changed metolazone dosing to 3 times weekly.  Currently, those days are Tuesday, Thursday, and  Sunday.  She did take metolazone yesterday.  Lab had difficult time pulling blood due to diuretic effect of metolazone.  Patient and her niece plan to contact cardiology to see  if she can change the days of the week she takes metolazone to make blood draws up and he is here.  We did discuss this.  I do not believe there would be a problem changing days of the week however, they do wish to contact cardiology to "be on the safe side."  Plan: Labs reviewed.  Moderate and stable anemia.  Hgb 8.2 today.  Blood transfusion not needed this week. Proceed with bevacizumab, Aranesp, and octreotide as planned. Continue to check labs weekly as hemoglobin slowly trending downward.  Plan for blood transfusion or iron infusion as needed. Continue bevacizumab and Aranesp every 2 weeks.  Next treatment on 08/11/2023. Continue octreotide for 4 weeks.   The patient understands the plans discussed today and is in agreement with them.  She knows to contact our office if she develops concerns prior to her next appointment.  I provided 25 minutes of face-to-face time during this encounter and > 50% was spent counseling as documented under my assessment and plan.    Carlean Jews, NP  [facility] - A DEPT [facility] [address] [place] [address] Dept: [phone] Dept Fax: [phone]   Orders Placed This Encounter  Procedures   CMP (Cancer Center only)    Standing Status:   Future    Expected Date:   08/04/2023    Expiration Date:   07/27/2024   Ferritin    Standing Status:   Future    Expected Date:   08/04/2023    Expiration Date:   07/27/2024      CHIEF COMPLAINT:  CC: Primary pancreatic neuroendocrine tumor and severe iron deficiency anemia  Current Treatment: Bevacizumab every 2 weeks, Aranesp every 2 weeks, Sandostatin injection every 4 weeks  INTERVAL HISTORY:  Marcel is here today for repeat clinical assessment.  Patient was last seen by Dr. Mosetta Putt on 07/14/2023.  Did not require blood transfusion at that time.  Due for bevacizumab today.  Today is cycle 8 day 1 of bevacizumab and Aranesp.   Not requiring blood transfusion this week.  Hemoglobin is 8.2.  Will add ferritin level to next lab draw to see if IV iron may be needed as Hgb levels have slowly been dropping though, remain >8.0.  She reports decreased appetite over last few days.  Having increased left upper quadrant pain and a sensation of bloating in the right side of her abdomen.  This is intermittent.  Currently feels okay.  Legs and feet continue to hurt.  Left leg wrapped this morning per home health due to recurrent infection.  Right leg swollen but improved.  She continues to see dermatology, Dr. Shirlee Latch.  She states that feet and legs hurt more when standing up for extended length of time.  She recently saw her cardiologist at the heart failure clinic.  They did increase metolazone to 3 times weekly.  She is taking this on Tuesday, Thursday, and Sunday.  Took yesterday, so she is slightly dehydrated today.  Took several sticks before lab was able to pull her blood work.  She is drinking water.  This does have to be carefully balanced due to CHF and chronic kidney disease.  She denies chest pain or chest pressure.  She does  have chronic shortness of breath.  She uses oxygen at 2 L/min. She denies headaches or visual disturbances. She denies fevers or chills. She denies pain. Her appetite is fair. Her weight has been stable.  I have reviewed the past medical history, past surgical history, social history and family history with the patient and they are unchanged from previous note.  ALLERGIES:  is allergic to codeine, dilaudid [hydromorphone], tape, amoxil [amoxicillin], asa [aspirin], ms contin [morphine], neurontin [gabapentin], and nsaids.  MEDICATIONS:  Current Outpatient Medications  Medication Sig Dispense Refill   acetaminophen (TYLENOL) 500 MG tablet Take 1,000 mg by mouth every 8 (eight) hours.     Ascorbic Acid (VITAMIN C PO) Take 1 tablet by mouth daily.     Cholecalciferol (VITAMIN D-3 PO) Take 2 capsules by mouth  daily.     CRANBERRY PO Take 1 tablet by mouth daily.     Cyanocobalamin (B-12 PO) Take 1 tablet by mouth daily.     ferrous gluconate (FERGON) 324 MG tablet Take 1 tablet (324 mg total) by mouth daily with breakfast. 30 tablet 3   fluticasone (FLONASE) 50 MCG/ACT nasal spray Place 2 sprays into both nostrils daily as needed for allergies or rhinitis. 16 g 0   metolazone (ZAROXOLYN) 2.5 MG tablet Take 1 tablet (2.5 mg total) by mouth 3 (three) times a week. Every Tues., Thurs and Sat. with 40 meq of potassium 90 tablet 3   Multiple Vitamins-Minerals (HAIR SKIN NAILS PO) Take 1 capsule by mouth daily.     pantoprazole (PROTONIX) 40 MG tablet Take 1 tablet (40 mg total) by mouth daily. 30 tablet 3   Potassium Chloride ER 20 MEQ TBCR Take 1 tablet (20 mEq total) by mouth daily. 30 tablet 2   torsemide (DEMADEX) 20 MG tablet Take 5 tablets (100 mg total) by mouth 2 (two) times daily. 250 tablet 3   rosuvastatin (CRESTOR) 10 MG tablet Take 1 tablet (10 mg total) by mouth daily. 30 tablet 0   No current facility-administered medications for this visit.   Facility-Administered Medications Ordered in Other Visits  Medication Dose Route Frequency Provider Last Rate Last Admin   bevacizumab-adcd (VEGZELMA) 300 mg in sodium chloride 0.9 % 100 mL chemo infusion  5 mg/kg (Treatment Plan Recorded) Intravenous Once Malachy Mood, MD       epoetin alfa-epbx (RETACRIT) injection 40,000 Units  40,000 Units Subcutaneous Once Malachy Mood, MD       octreotide (SANDOSTATIN LAR) IM injection 20 mg  20 mg Intramuscular Once Pollyann Samples, NP           REVIEW OF SYSTEMS:   Constitutional: Denies fevers, chills or abnormal weight loss Eyes: Denies blurriness of vision Ears, nose, mouth, throat, and face: Denies mucositis or sore throat Respiratory: Denies cough or wheezes. Does have chronic SOB.  On nasal cannula oxygen at 2 LPM. Cardiovascular: Denies palpitation or chest discomfort. Moderate lower extremity edema.   Gastrointestinal:  Denies nausea, heartburn or change in bowel habits Skin: Denies abnormal skin rashes. Skin cancer of bilateral lower legs.  Lymphatics: Denies new lymphadenopathy or easy bruising Neurological:Denies numbness, tingling or new weaknesses Behavioral/Psych: Mood is stable, no new changes  All other systems were reviewed with the patient and are negative.   VITALS:   Today's Vitals   07/28/23 1157  BP: (!) 142/50  Pulse: 69  Resp: (!) 22  Temp: 98 F (36.7 C)  TempSrc: Temporal  SpO2: 91%  Weight: 134 lb 9.6 oz (61.1 kg)  Height: 5\' 6"  (1.676 m)   Body mass index is 21.73 kg/m.   Wt Readings from Last 3 Encounters:  07/28/23 134 lb 9.6 oz (61.1 kg)  07/24/23 135 lb 12.8 oz (61.6 kg)  07/14/23 134 lb 14.4 oz (61.2 kg)    Body mass index is 21.73 kg/m.  Performance status (ECOG): 1 - Symptomatic but completely ambulatory  PHYSICAL EXAM:   GENERAL:alert, no distress and comfortable SKIN: skin color, texture, turgor are normal, no rashes or significant lesions EYES: normal, Conjunctiva are pink and non-injected, sclera clear OROPHARYNX:no exudate, no erythema and lips, buccal mucosa, and tongue normal  NECK: supple, thyroid normal size, non-tender, without nodularity LYMPH:  no palpable lymphadenopathy in the cervical, axillary or inguinal LUNGS: clear to auscultation and percussion with normal breathing effort HEART: regular rate & rhythm and no murmurs and no lower extremity edema ABDOMEN:abdomen soft. There is some tenderness in left upper quadrant portion of abdomen. Slight swelling of right upper abdomen noted. Bowel sounds present in all 4 quadrants.  Musculoskeletal:no cyanosis of digits and no clubbing  NEURO: alert & oriented x 3 with fluent speech, no focal motor/sensory deficits  LABORATORY DATA:  I have reviewed the data as listed    Component Value Date/Time   NA 134 (L) 07/10/2023 1641   NA 137 06/09/2023 1456   K 3.8 07/10/2023 1641    CL 92 (L) 07/10/2023 1641   CO2 30 07/10/2023 1641   GLUCOSE 77 07/10/2023 1641   BUN 52 (H) 07/10/2023 1641   BUN 48 (H) 06/09/2023 1456   CREATININE 1.70 (H) 07/10/2023 1641   CREATININE 2.12 (H) 06/23/2023 1518   CALCIUM 8.9 07/10/2023 1641   PROT 7.3 06/24/2023 1621   PROT 7.2 06/09/2014 1317   ALBUMIN 3.5 06/24/2023 1621   ALBUMIN 3.8 06/09/2014 1317   AST 19 06/24/2023 1621   AST 14 (L) 05/14/2023 0958   ALT 13 06/24/2023 1621   ALT 7 05/14/2023 0958   ALT 23 06/09/2014 1317   ALKPHOS 58 06/24/2023 1621   ALKPHOS 71 06/09/2014 1317   BILITOT 0.7 06/24/2023 1621   BILITOT 0.6 05/14/2023 0958   GFRNONAA 29 (L) 07/10/2023 1641   GFRNONAA 30 (L) 05/14/2023 0958   GFRAA 39 (L) 12/23/2019 1458   Lab Results  Component Value Date   WBC 4.5 07/28/2023   NEUTROABS 3.6 07/28/2023   HGB 8.2 (L) 07/28/2023   HCT 28.8 (L) 07/28/2023   MCV 85.0 07/28/2023   PLT 205 07/28/2023

## 2023-07-27 NOTE — Assessment & Plan Note (Signed)
 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   -Outpatient EUS 03/04/2022 by Dr. 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 Freida Busman did not recommend surgery in multifocal NET and also due to her age and co-morbidities. -She began first line sandostatin injections q28 days on 03/27/22.  -Dotatate PET scan, done 2 weeks after first injection 04/10/22, shows no significant tracer avidity in the pancrease or distant sites. Findings are likely due to close timing relative to the sandostatin injection blocking receptors. When we repeat this for restaging next year, will arrange 4 weeks after sandostatin -She has persistent anemia, likely secondary to GI bleeding from the tumor and AVM, she has had multiple hospital admission for anemia and bleeding.   -continue with aranesp injection and bevacizumab every other week.  --check labs weekly and administer blood transfusions as needed.  --07/28/2023 - no blood transfusion needed this week.

## 2023-07-27 NOTE — Assessment & Plan Note (Signed)
 Patient currently receiving Aranesp injections every 2 weeks.  Blood counts improving.  Has not needed blood transfusion.

## 2023-07-28 ENCOUNTER — Inpatient Hospital Stay: Payer: PPO | Admitting: Nurse Practitioner

## 2023-07-28 ENCOUNTER — Encounter: Payer: Self-pay | Admitting: Nurse Practitioner

## 2023-07-28 ENCOUNTER — Inpatient Hospital Stay

## 2023-07-28 ENCOUNTER — Encounter: Payer: Self-pay | Admitting: Hematology

## 2023-07-28 ENCOUNTER — Other Ambulatory Visit: Payer: PPO

## 2023-07-28 ENCOUNTER — Inpatient Hospital Stay: Payer: PPO

## 2023-07-28 VITALS — BP 142/50 | HR 69 | Temp 98.0°F | Resp 22 | Ht 66.0 in | Wt 134.6 lb

## 2023-07-28 DIAGNOSIS — D649 Anemia, unspecified: Secondary | ICD-10-CM | POA: Diagnosis not present

## 2023-07-28 DIAGNOSIS — D3A8 Other benign neuroendocrine tumors: Secondary | ICD-10-CM

## 2023-07-28 DIAGNOSIS — Z5112 Encounter for antineoplastic immunotherapy: Secondary | ICD-10-CM | POA: Diagnosis not present

## 2023-07-28 DIAGNOSIS — K5520 Angiodysplasia of colon without hemorrhage: Secondary | ICD-10-CM

## 2023-07-28 LAB — CBC WITH DIFFERENTIAL (CANCER CENTER ONLY)
Abs Immature Granulocytes: 0.01 10*3/uL (ref 0.00–0.07)
Basophils Absolute: 0 10*3/uL (ref 0.0–0.1)
Basophils Relative: 0 %
Eosinophils Absolute: 0 10*3/uL (ref 0.0–0.5)
Eosinophils Relative: 0 %
HCT: 28.8 % — ABNORMAL LOW (ref 36.0–46.0)
Hemoglobin: 8.2 g/dL — ABNORMAL LOW (ref 12.0–15.0)
Immature Granulocytes: 0 %
Lymphocytes Relative: 9 %
Lymphs Abs: 0.4 10*3/uL — ABNORMAL LOW (ref 0.7–4.0)
MCH: 24.2 pg — ABNORMAL LOW (ref 26.0–34.0)
MCHC: 28.5 g/dL — ABNORMAL LOW (ref 30.0–36.0)
MCV: 85 fL (ref 80.0–100.0)
Monocytes Absolute: 0.5 10*3/uL (ref 0.1–1.0)
Monocytes Relative: 10 %
Neutro Abs: 3.6 10*3/uL (ref 1.7–7.7)
Neutrophils Relative %: 81 %
Platelet Count: 205 10*3/uL (ref 150–400)
RBC: 3.39 MIL/uL — ABNORMAL LOW (ref 3.87–5.11)
RDW: 19.5 % — ABNORMAL HIGH (ref 11.5–15.5)
WBC Count: 4.5 10*3/uL (ref 4.0–10.5)
nRBC: 0 % (ref 0.0–0.2)

## 2023-07-28 LAB — TOTAL PROTEIN, URINE DIPSTICK: Protein, ur: NEGATIVE mg/dL

## 2023-07-28 LAB — SAMPLE TO BLOOD BANK

## 2023-07-28 MED ORDER — EPOETIN ALFA-EPBX 40000 UNIT/ML IJ SOLN
40000.0000 [IU] | Freq: Once | INTRAMUSCULAR | Status: AC
  Administered 2023-07-28: 40000 [IU] via SUBCUTANEOUS
  Filled 2023-07-28: qty 1

## 2023-07-28 MED ORDER — OCTREOTIDE ACETATE 20 MG IM KIT
20.0000 mg | PACK | Freq: Once | INTRAMUSCULAR | Status: AC
  Administered 2023-07-28: 20 mg via INTRAMUSCULAR
  Filled 2023-07-28: qty 1

## 2023-07-28 MED ORDER — SODIUM CHLORIDE 0.9 % IV SOLN
5.0000 mg/kg | Freq: Once | INTRAVENOUS | Status: AC
  Administered 2023-07-28: 300 mg via INTRAVENOUS
  Filled 2023-07-28: qty 12

## 2023-07-28 MED ORDER — SODIUM CHLORIDE 0.9 % IV SOLN
Freq: Once | INTRAVENOUS | Status: AC
Start: 2023-07-28 — End: 2023-07-28

## 2023-07-28 NOTE — Patient Instructions (Signed)
 Bevacizumab Injection What is this medication? BEVACIZUMAB (be va SIZ yoo mab) treats some types of cancer. It works by blocking a protein that causes cancer cells to grow and multiply. This helps to slow or stop the spread of cancer cells. It is a monoclonal antibody. This medicine may be used for other purposes; ask your health care provider or pharmacist if you have questions. COMMON BRAND NAME(S): Alymsys, Avastin, MVASI, Rosaland Lao What should I tell my care team before I take this medication? They need to know if you have any of these conditions: Blood clots Coughing up blood Having or recent surgery Heart failure High blood pressure History of a connection between 2 or more body parts that do not usually connect (fistula) History of a tear in your stomach or intestines Protein in your urine An unusual or allergic reaction to bevacizumab, other medications, foods, dyes, or preservatives Pregnant or trying to get pregnant Breast-feeding How should I use this medication? This medication is injected into a vein. It is given by your care team in a hospital or clinic setting. Talk to your care team the use of this medication in children. Special care may be needed. Overdosage: If you think you have taken too much of this medicine contact a poison control center or emergency room at once. NOTE: This medicine is only for you. Do not share this medicine with others. What if I miss a dose? Keep appointments for follow-up doses. It is important not to miss your dose. Call your care team if you are unable to keep an appointment. What may interact with this medication? Interactions are not expected. This list may not describe all possible interactions. Give your health care provider a list of all the medicines, herbs, non-prescription drugs, or dietary supplements you use. Also tell them if you smoke, drink alcohol, or use illegal drugs. Some items may interact with your medicine. What  should I watch for while using this medication? Your condition will be monitored carefully while you are receiving this medication. You may need blood work while taking this medication. This medication may make you feel generally unwell. This is not uncommon as chemotherapy can affect healthy cells as well as cancer cells. Report any side effects. Continue your course of treatment even though you feel ill unless your care team tells you to stop. This medication may increase your risk to bruise or bleed. Call your care team if you notice any unusual bleeding. Before having surgery, talk to your care team to make sure it is ok. This medication can increase the risk of poor healing of your surgical site or wound. You will need to stop this medication for 28 days before surgery. After surgery, wait at least 28 days before restarting this medication. Make sure the surgical site or wound is healed enough before restarting this medication. Talk to your care team if questions. Talk to your care team if you may be pregnant. Serious birth defects can occur if you take this medication during pregnancy and for 6 months after the last dose. Contraception is recommended while taking this medication and for 6 months after the last dose. Your care team can help you find the option that works for you. Do not breastfeed while taking this medication and for 6 months after the last dose. This medication can cause infertility. Talk to your care team if you are concerned about your fertility. What side effects may I notice from receiving this medication? Side effects that you should  report to your care team as soon as possible: Allergic reactions--skin rash, itching, hives, swelling of the face, lips, tongue, or throat Bleeding--bloody or black, tar-like stools, vomiting blood or brown material that looks like coffee grounds, red or dark brown urine, small red or purple spots on skin, unusual bruising or bleeding Blood  clot--pain, swelling, or warmth in the leg, shortness of breath, chest pain Heart attack--pain or tightness in the chest, shoulders, arms, or jaw, nausea, shortness of breath, cold or clammy skin, feeling faint or lightheaded Heart failure--shortness of breath, swelling of the ankles, feet, or hands, sudden weight gain, unusual weakness or fatigue Increase in blood pressure Infection--fever, chills, cough, sore throat, wounds that don't heal, pain or trouble when passing urine, general feeling of discomfort or being unwell Infusion reactions--chest pain, shortness of breath or trouble breathing, feeling faint or lightheaded Kidney injury--decrease in the amount of urine, swelling of the ankles, hands, or feet Stomach pain that is severe, does not go away, or gets worse Stroke--sudden numbness or weakness of the face, arm, or leg, trouble speaking, confusion, trouble walking, loss of balance or coordination, dizziness, severe headache, change in vision Sudden and severe headache, confusion, change in vision, seizures, which may be signs of posterior reversible encephalopathy syndrome (PRES) Side effects that usually do not require medical attention (report to your care team if they continue or are bothersome): Back pain Change in taste Diarrhea Dry skin Increased tears Nosebleed This list may not describe all possible side effects. Call your doctor for medical advice about side effects. You may report side effects to FDA at 1-800-FDA-1088. Where should I keep my medication? This medication is given in a hospital or clinic. It will not be stored at home. NOTE: This sheet is a summary. It may not cover all possible information. If you have questions about this medicine, talk to your doctor, pharmacist, or health care provider.  2024 Elsevier/Gold Standard (2021-09-07 00:00:00)

## 2023-08-01 ENCOUNTER — Telehealth: Payer: Self-pay | Admitting: *Deleted

## 2023-08-01 NOTE — Progress Notes (Signed)
 Complex Care Management Care Guide Note  08/01/2023 Name: [name] MRN: [id number] DOB: 10/23/1933  [name] is a 89 y.o. year old female who is a primary care patient of [name], MD and is actively engaged with the care management team. I reached out to [name] by phone today to assist with re-scheduling  with the RN Case Manager.  Follow up plan: Unsuccessful telephone outreach attempt made. A HIPAA compliant phone message was left for the patient providing contact information and requesting a return call.  Burman Nieves, CMA [place]  [facility], [facility] Guide Direct Dial: [phone]  Fax: [phone] Website: [email or web address]

## 2023-08-04 ENCOUNTER — Other Ambulatory Visit: Payer: Self-pay

## 2023-08-04 ENCOUNTER — Inpatient Hospital Stay: Payer: PPO

## 2023-08-04 DIAGNOSIS — D3A8 Other benign neuroendocrine tumors: Secondary | ICD-10-CM

## 2023-08-04 DIAGNOSIS — D649 Anemia, unspecified: Secondary | ICD-10-CM

## 2023-08-04 DIAGNOSIS — K5520 Angiodysplasia of colon without hemorrhage: Secondary | ICD-10-CM

## 2023-08-04 DIAGNOSIS — Z5112 Encounter for antineoplastic immunotherapy: Secondary | ICD-10-CM | POA: Diagnosis not present

## 2023-08-04 DIAGNOSIS — D500 Iron deficiency anemia secondary to blood loss (chronic): Secondary | ICD-10-CM

## 2023-08-04 LAB — CBC WITH DIFFERENTIAL (CANCER CENTER ONLY)
Abs Immature Granulocytes: 0.01 10*3/uL (ref 0.00–0.07)
Basophils Absolute: 0 10*3/uL (ref 0.0–0.1)
Basophils Relative: 1 %
Eosinophils Absolute: 0 10*3/uL (ref 0.0–0.5)
Eosinophils Relative: 0 %
HCT: 26.7 % — ABNORMAL LOW (ref 36.0–46.0)
Hemoglobin: 7.8 g/dL — ABNORMAL LOW (ref 12.0–15.0)
Immature Granulocytes: 0 %
Lymphocytes Relative: 10 %
Lymphs Abs: 0.4 10*3/uL — ABNORMAL LOW (ref 0.7–4.0)
MCH: 23.8 pg — ABNORMAL LOW (ref 26.0–34.0)
MCHC: 29.2 g/dL — ABNORMAL LOW (ref 30.0–36.0)
MCV: 81.4 fL (ref 80.0–100.0)
Monocytes Absolute: 0.6 10*3/uL (ref 0.1–1.0)
Monocytes Relative: 16 %
Neutro Abs: 2.8 10*3/uL (ref 1.7–7.7)
Neutrophils Relative %: 73 %
Platelet Count: 202 10*3/uL (ref 150–400)
RBC: 3.28 MIL/uL — ABNORMAL LOW (ref 3.87–5.11)
RDW: 19 % — ABNORMAL HIGH (ref 11.5–15.5)
WBC Count: 3.9 10*3/uL — ABNORMAL LOW (ref 4.0–10.5)
nRBC: 0 % (ref 0.0–0.2)

## 2023-08-04 LAB — CMP (CANCER CENTER ONLY)
ALT: 7 U/L (ref 0–44)
AST: 15 U/L (ref 15–41)
Albumin: 4 g/dL (ref 3.5–5.0)
Alkaline Phosphatase: 51 U/L (ref 38–126)
Anion gap: 9 (ref 5–15)
BUN: 59 mg/dL — ABNORMAL HIGH (ref 8–23)
CO2: 32 mmol/L (ref 22–32)
Calcium: 9.4 mg/dL (ref 8.9–10.3)
Chloride: 94 mmol/L — ABNORMAL LOW (ref 98–111)
Creatinine: 2.13 mg/dL — ABNORMAL HIGH (ref 0.44–1.00)
GFR, Estimated: 22 mL/min — ABNORMAL LOW (ref >60)
Glucose, Bld: 103 mg/dL — ABNORMAL HIGH (ref 70–99)
Potassium: 4 mmol/L (ref 3.5–5.1)
Sodium: 135 mmol/L (ref 135–145)
Total Bilirubin: 0.6 mg/dL (ref 0.0–1.2)
Total Protein: 7.8 g/dL (ref 6.5–8.1)

## 2023-08-04 LAB — SAMPLE TO BLOOD BANK

## 2023-08-04 LAB — FERRITIN: Ferritin: 15 ng/mL (ref 11–307)

## 2023-08-05 ENCOUNTER — Other Ambulatory Visit: Payer: Self-pay

## 2023-08-05 ENCOUNTER — Telehealth: Payer: Self-pay | Admitting: Cardiology

## 2023-08-05 DIAGNOSIS — J439 Emphysema, unspecified: Secondary | ICD-10-CM | POA: Diagnosis not present

## 2023-08-05 DIAGNOSIS — D3A8 Other benign neuroendocrine tumors: Secondary | ICD-10-CM | POA: Insufficient documentation

## 2023-08-05 DIAGNOSIS — Z79899 Other long term (current) drug therapy: Secondary | ICD-10-CM | POA: Insufficient documentation

## 2023-08-05 DIAGNOSIS — R252 Cramp and spasm: Secondary | ICD-10-CM | POA: Insufficient documentation

## 2023-08-05 DIAGNOSIS — D500 Iron deficiency anemia secondary to blood loss (chronic): Secondary | ICD-10-CM | POA: Diagnosis not present

## 2023-08-05 DIAGNOSIS — I4821 Permanent atrial fibrillation: Secondary | ICD-10-CM | POA: Diagnosis not present

## 2023-08-05 DIAGNOSIS — I5032 Chronic diastolic (congestive) heart failure: Secondary | ICD-10-CM | POA: Diagnosis not present

## 2023-08-05 DIAGNOSIS — R112 Nausea with vomiting, unspecified: Secondary | ICD-10-CM | POA: Diagnosis not present

## 2023-08-05 DIAGNOSIS — C7A8 Other malignant neuroendocrine tumors: Secondary | ICD-10-CM | POA: Diagnosis not present

## 2023-08-05 DIAGNOSIS — D631 Anemia in chronic kidney disease: Secondary | ICD-10-CM | POA: Diagnosis not present

## 2023-08-05 DIAGNOSIS — N1832 Chronic kidney disease, stage 3b: Secondary | ICD-10-CM | POA: Insufficient documentation

## 2023-08-05 DIAGNOSIS — L989 Disorder of the skin and subcutaneous tissue, unspecified: Secondary | ICD-10-CM | POA: Diagnosis not present

## 2023-08-05 DIAGNOSIS — K31819 Angiodysplasia of stomach and duodenum without bleeding: Secondary | ICD-10-CM | POA: Diagnosis not present

## 2023-08-05 DIAGNOSIS — Z85828 Personal history of other malignant neoplasm of skin: Secondary | ICD-10-CM | POA: Diagnosis not present

## 2023-08-05 LAB — PREPARE RBC (CROSSMATCH)

## 2023-08-05 NOTE — Progress Notes (Signed)
 Complex Care Management Care Guide Note  08/05/2023 Name: [name] MRN: [id number] DOB: 14-Feb-1934  [name] is a 89 y.o. year old female who is a primary care patient of [name], MD and is actively engaged with the care management team. I reached out to Franklyn Lor by phone today to assist with re-scheduling  with the RN Case Manager.  Follow up plan: Unsuccessful telephone outreach attempt made. A HIPAA compliant phone message was left for the patient providing contact information and requesting a return call. No further outreach attempts will be made due to inability to maintain patient contact.   Burman Nieves, CMA [place]  [facility], [facility] Guide Direct Dial: [phone]  Fax: [phone] Website: [email or web address]

## 2023-08-05 NOTE — Progress Notes (Signed)
 Verbal order given to adminster 1 unit of PRBC from Dr. Mosetta Putt spoke with Tresa Endo in blood bank type and screen order received patient will have 1 unit of PRBC transfused on 08/07/2023

## 2023-08-05 NOTE — Progress Notes (Signed)
 [facility] CLINIC NOTE    ID:  [name], DOB January 12, 1934, MRN [id number]    PCP:  [name], MD (last seen 10/24) Cardiologist:  Sherryl Manges, MD (last seen 11/24) Primary HF: Dr. Shirlee Latch (last seen 12/24)  Chief Complaint: CHF   History of Present Illness:  89 y.o. with history of permanent atrial fibrillation, chronic diastolic CHF, CKD stage 3, smoking/prior COPD, renal cell CA s/p right nephrectomy. She has a prior history of HFrEF with nonischemic cardiomyopathy and had a Medtronic ICD placed. However, subsequently her EF has recovered.    06/20, she had left hip ORIF after mechanical fall.  Echo in 06/20 showed EF 60-65%, mild RV dilation with normal systolic function, PASP 93 mmHg.  After she got home from the hospital stay post-ORIF, she noted marked peripheral edema. She was short of breath walking short distances.  +orthopnea. She came back to the hospital 07/20 and was admitted with volume overload. She was diuresed aggressively and lost about 22 lbs. RHC showed pulmonary venous hypertension. PCWP tracing had prominent v-waves in absence of significant MR, suggesting significant diastolic dysfunction. Noted to have significant Fe deficiency anemia, but FOBT was negative during 7/20 hospitalization.   Admitted 11/20 with symptomatic anemia, GI workup concerning for ischemic bowel. 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 Durwin Nora with VVS and conservative management recommended. Capsule endoscopy showed no definite source of bleeding. She was volume overloaded due to significant RV dysfunction and diuresed, she also developed AKI on CKD stage 3.      10/22, she had bilateral CIA stents placed. ABIs in 11/22 showed patent stents. She had a skin cancer removed from her left leg. 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   Echo 2/23 EF 65-70% with mild LVH, D-shaped septum with moderate RV enlargement, and mildly  decreased RV function, PASP 92, moderate-severe TR, dilated IVC, moderate aortic stenosis. Echo in 10/23 showed EF 60-65%, D-shaped septum, mild RV enlargement with normal RV systolic function, PASP 90, moderate-severe TR, mild AS, IVC dilated.   Patient was found to have a neuroendocrine tumor of the pancreatic head in the fall of 2023.  She has been treated with sandostatin monthly.  She also had further GI bleeding from small bowel AVMs in fall 2023. She also had COVID-19 in 10/23 and has been on home oxygen 2L since that time.   Follow up with Dr. Graciela Husbands, long discussion about EOL. Given DNR form and high-voltage therapies inactivated on ICD.  Admit 07/24 with acute on chronic blood loss anemia 2/2 GI bleed. Transfused and given IV iron. Had small bowel enteroscopy which showed 2 nonbleeding angiodysplastic lesions in duodenum and 3 nonbleeding angioplastic lesions in jejunum treated with APC.  Admitted early 09/24 with acute on chronic anemia suspected 2/2 chronic GI blood loss. Hgb down to 6.8. Received 2 u RBCs. GI did not recommend any endoscopic procedures d/t advanced age and comorbidities. Course further c/b acute on chronic CHF. She was diuresed with IV lasix and metolazone. Echo during admit with LVEF 65-70%, D-shaped septum, mildly reduced RV, RVSP 103 mmHg, biatrial enlargement, severe TR, moderate mitral stenosis with mean gradient of 6 mmHg, dilated IVC.  She was seen back in the [facility] on 01/30/23 for post hospital f/u and noted recurrent melena, SOB and fatigued w/ minimal exertion. Also noted to have recurrent volume overload w/ marked return of LEE. Hgb was down to 6.6. She was sent  back to the ED and readmitted again for a/c CHF and GIB, treated w/ blood transfusions, IV Fe and diuretics. Discharged on 02/06/23 on torsemide 100 mg bid.   S/p 2u RBCs 04/26/23 for symptomatic anemia. Hgb 6.8.   Admitted 05/27/23 due to shortness of breath/ leg swelling. Initially given IV lasix  with  removal of ~ 4Land transitioned to oral torsemide at 100mg  BID at discharge. Metolazone decreased to weekly. Treated with antibiotics for leg cellulitis. Given 1 unit PRBC's due to hemoglobin trending down to 7.1. Wound care consulted due to weeping area on left shin. Palliative care also consulted.   She was admitted in 2/25 with lower leg cellulitis.  She was treated with IV abx.   Continues to have some worsening swelling of the lower extremities. She reports that her right leg is somewhat improved but that her left remains significantly swollen. She reports taking her metolazone as prescribed, and when she takes it she feels her swelling improves. She denies any worsening of her shortness of breath.     Labs (10/23): magnesium 1.7 Labs (3/24): TSH 2.93 Labs (4/24): K 3.9, creatinine 1.69, ALT 9, hgb 10, Plt 175 Labs (8/24): K 4.5, creatinine, 1.6 Labs (10/24): K 4.1, creatinine 1.55, Hgb 7.6 Labs (11/24): K 4.6, SCr 1.52 Labs (11/24): K 4.6, SCr 1.82 => 1.78, TSat 3, ferritin 16 Labs (12/24): K 3.9, creatinine 1.83 Labs (01/25): K 4.2, creatinine 1.89 Labs (2/25): hgb 8.5, BNP 517, K 3.5, creatinine 1.86  PMH: 1. COPD 2. Renal cell carcinoma: S/p right kidney resection.  3. CKD stage 3 4. Fe deficiency anemia 5. Atrial fibrillation: Permanent. She has failed amiodarone, Tikosyn, and flecainide as well as multiple cardioversions. 6. Chronic diastolic CHF: She has history of prior HFrEF with nonischemic cardiomyopathy.  She has a Medtronic ICD.   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  - Echo (6/20): EF 60-65%, mildly dilated RV with PASP 93 mmHg, mild-moderate TR.  - RHC (7/20): mean RA 11, PA 64/18 mean 39, mean PCWP 24 with v waves to 41, CI 4.21, PVR 1.9 WU.  - Cardiomems placement - RHC (11/20): mean RA 13, PA 63/23 mean 25 with v waves to 47 (MR only mild by echo), mean PCWP 25, CI 3.18, PVR 2.6 WU - Echo (11/21): EF 65-70%, moderate LVH, D-shaped septum with mildly decreased  RV systolic function, RVSP 85.9 mmHg, severe RAE, severe TR, mod-sev AS. - Echo (2/23): EF 65-70% with mild LVH, D-shaped septum with moderate RV enlargement, and mildly decreased RV function, PASP 92, moderate-severe TR, dilated IVC, moderate aortic stenosis.  - Echo (10/23): EF 60-65%, D-shaped septum, mild RV enlargement with normal RV systolic function, PASP 90, moderate-severe TR, mild AS, IVC dilated.  - Echo (9/24): LVEF 65-70%, D-shaped septum, mildly reduced RV, RVSP 103 mmHg, biatrial enlargement, severe TR, moderate mitral stenosis with mean gradient of 6 mmHg, dilated IVC - Echo (01/25): EF 65-70%, mild LVH, RV moderately reduced, severely elevated PA pressure 83.9 mmHg, mild MR, moderate MS with mean gradient of 6.0 mmHg, IVC dialted 7. Hyperlipidemia 8. Carotid stenosis: Carotid dopplers (3/19) with 40-59% LICA stenosis.   9. Possible ischemic bowel in 11/20 10. 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 Conservative management per Dr. Durwin Nora.  - 10/22 bilateral CIA stents.  - 11/22 ABIs: 1.07 left, 0.83 right with patent CIA stents.  11. 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  12. Squamous cell skin cancer.  13. 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  14. COVID 19 in 10/23    Current Outpatient Medications on File Prior to Visit  Medication Sig Dispense Refill   acetaminophen (TYLENOL) 500 MG tablet Take 1,000 mg by mouth every 8 (eight) hours.     Ascorbic Acid (VITAMIN C PO) Take 1 tablet by mouth daily.     Cholecalciferol (VITAMIN D-3 PO) Take 2 capsules by mouth daily.     CRANBERRY PO Take 1 tablet by mouth daily.     Cyanocobalamin (B-12 PO) Take 1 tablet by mouth daily.     ferrous gluconate (FERGON) 324 MG tablet Take 1 tablet (324 mg total) by mouth daily with breakfast. 30 tablet 3   fluticasone (FLONASE) 50 MCG/ACT nasal spray Place 2 sprays into both nostrils daily as needed  for allergies or rhinitis. 16 g 0   Multiple Vitamins-Minerals (HAIR SKIN NAILS PO) Take 1 capsule by mouth daily.     pantoprazole (PROTONIX) 40 MG tablet Take 1 tablet (40 mg total) by mouth daily. 30 tablet 3   torsemide (DEMADEX) 20 MG tablet Take 5 tablets (100 mg total) by mouth 2 (two) times daily. 250 tablet 3   rosuvastatin (CRESTOR) 10 MG tablet Take 1 tablet (10 mg total) by mouth daily. 30 tablet 0   No current facility-administered medications on file prior to visit.     Allergies:   Dilaudid [hydromorphone], Fentanyl, Codeine, Morphine and related, Amoxicillin, Hydrocodone, Oxycodone, Tramadol, Whole blood, and Sulfonamide derivatives    Social History:  The patient  reports that she quit smoking about 21 years ago. Her smoking use included cigarettes. She has a 20.00 pack-year smoking history. She has never used smokeless tobacco. She reports that she does not drink alcohol and does not use drugs.    Family History:  The patient's family history includes Breast cancer in her cousin and another family member; Breast cancer (age of onset: 66) in her maternal aunt; Early death in her father; Heart failure in her mother.    ROS:  Please see the history of present illness.   All other systems are personally reviewed and negative.    Vitals:   07/24/23 1343  BP: (!) 140/51  Pulse: 60  SpO2: 100%  Weight: 135 lb 12.8 oz (61.6 kg)   Wt Readings from Last 3 Encounters:  07/28/23 134 lb 9.6 oz (61.1 kg)  07/24/23 135 lb 12.8 oz (61.6 kg)  07/14/23 134 lb 14.4 oz (61.2 kg)   Lab Results  Component Value Date   CREATININE 2.13 (H) 08/04/2023   CREATININE 1.70 (H) 07/10/2023   CREATININE 1.86 (H) 06/24/2023    PHYSICAL EXAM: General: NAD, frail Neck: JVP 10-11 cm with HJR, no thyromegaly or thyroid nodule.  Lungs: Decrease BS right base.  CV: Nondisplaced PMI.  Heart irregular S1/S2, no S3/S4, 2/6 HSM LLSB.  1+ edema to knees.  No carotid bruit.  Normal pedal pulses.   Abdomen: Soft, nontender, no hepatosplenomegaly, no distention.  Skin: Intact without lesions or rashes.  Neurologic: Alert and oriented x 3.  Psych: Normal affect. Extremities: No clubbing or cyanosis.  HEENT: Normal.   ASSESSMENT AND PLAN:  1. Chronic diastolic CHF: With prominent RV failure. Echo in 6/20 showed EF 60-65%, mild RV dilation with normal systolic function, PASP 93 mmHg.  She had remote nonischemic cardiomyopathy with recovery of EF, has Medtronic ICD. Atrial fibrillation likely contributes, but this appears to be permanent now as she has failed multiple  anti-arrhythmics and is reasonably rate-controlled. RHC 11/20 showed elevated filling pressures and pulmonary venous hypertension. Prominent v-waves on PCWP tracing likely due to stiff ventricle/diastolic dysfunction as she had only mild MR on echo. Echo 10/23 with EF 60-65%, D-shaped septum, mild RV enlargement with normal RV systolic function, PASP 90, moderate-severe TR, mild AS, IVC dilated. Echo in 9/242 with LVEF 65-70%, D-shaped septum, mildly reduced RV, RVSP 103 mmHg, biatrial enlargement, severe TR, mild AS with mean gradient of 17 mmhg, moderate mitral stenosis with mean gradient of 6 mmHg, dilated IVC. She is RV pacing at a high percentage, but LV EF has remained normal. NYHA class III, stable from last visit. Reports that her swelling is worse and weight still above baseline. Will briefly increase metolazone to three times weekly and arrange close follow up, can likely back down at that time.  - Increase metolazone to 2.5mg  three times weekly for two weeks, then back off hopefully at next visit  - Continue torsemide 100 mg bid. Add KCl 20 daily (40 on metolazone days).  - Repeat labs at next visit - Off SGLT2i after UTIs 2. Hypertension: Mildly elevated, will not add antihypertensives.  3. Pulmonary hypertension:  Based on RHC 11/20, she has primarily pulmonary venous hypertension, not candidate for pulmonary  vasodilators.  4. CKD: Stage 4. Off SGLT2i.  BMET today.  5. Chronic anemia/GI bleeding: Due to small bowel AVMs. She has had transfusions and IV Fe.  - She is now off anticoagulation.  6. Atrial fibrillation: Permanent. Rate control is reasonable off nodal blockers. She has failed amiodarone, Tikosyn, and flecainide as well as multiple cardioversions.  - Off Eliquis with recurrent GI bleeding and transfusions.  - Not Watchman candidate due to frailty and poorly compensated CHF.  7. Neuroendocrine tumor of pancreatic head - Treating with bevacizumab.   Followup 2 wks with Dr. Shirlee Latch  I spent 32 minutes caring for this patient today including face to face time, ordering and reviewing labs, reviewing records from Dr. Shirlee Latch, recent APP visit, seeing the patient, documenting in the record, and arranging follow ups.    Romie Minus, 08/05/23

## 2023-08-05 NOTE — Telephone Encounter (Incomplete)
 Called to confirm/remind patient of their appointment at [facility] on 08/06/23***.   Appointment:   [x] Confirmed  [] Left mess   [] No answer/No voice mail  [] Phone not in service  Patient reminded to bring all medications and/or complete list.  Confirmed patient has transportation. Gave directions, instructed to utilize valet parking.

## 2023-08-06 ENCOUNTER — Ambulatory Visit: Attending: Cardiology | Admitting: Cardiology

## 2023-08-06 ENCOUNTER — Encounter: Payer: Self-pay | Admitting: Cardiology

## 2023-08-06 VITALS — BP 138/72 | HR 61 | Wt 135.1 lb

## 2023-08-06 DIAGNOSIS — I5032 Chronic diastolic (congestive) heart failure: Secondary | ICD-10-CM

## 2023-08-06 DIAGNOSIS — N184 Chronic kidney disease, stage 4 (severe): Secondary | ICD-10-CM | POA: Diagnosis not present

## 2023-08-06 DIAGNOSIS — I081 Rheumatic disorders of both mitral and tricuspid valves: Secondary | ICD-10-CM | POA: Insufficient documentation

## 2023-08-06 DIAGNOSIS — D631 Anemia in chronic kidney disease: Secondary | ICD-10-CM | POA: Diagnosis not present

## 2023-08-06 DIAGNOSIS — F17210 Nicotine dependence, cigarettes, uncomplicated: Secondary | ICD-10-CM | POA: Insufficient documentation

## 2023-08-06 DIAGNOSIS — D500 Iron deficiency anemia secondary to blood loss (chronic): Secondary | ICD-10-CM | POA: Insufficient documentation

## 2023-08-06 DIAGNOSIS — Z95810 Presence of automatic (implantable) cardiac defibrillator: Secondary | ICD-10-CM | POA: Diagnosis not present

## 2023-08-06 DIAGNOSIS — I130 Hypertensive heart and chronic kidney disease with heart failure and stage 1 through stage 4 chronic kidney disease, or unspecified chronic kidney disease: Secondary | ICD-10-CM | POA: Diagnosis not present

## 2023-08-06 DIAGNOSIS — C7A8 Other malignant neuroendocrine tumors: Secondary | ICD-10-CM | POA: Insufficient documentation

## 2023-08-06 DIAGNOSIS — I428 Other cardiomyopathies: Secondary | ICD-10-CM | POA: Diagnosis not present

## 2023-08-06 DIAGNOSIS — Z8553 Personal history of malignant neoplasm of renal pelvis: Secondary | ICD-10-CM | POA: Insufficient documentation

## 2023-08-06 DIAGNOSIS — I4821 Permanent atrial fibrillation: Secondary | ICD-10-CM | POA: Insufficient documentation

## 2023-08-06 DIAGNOSIS — J449 Chronic obstructive pulmonary disease, unspecified: Secondary | ICD-10-CM | POA: Diagnosis not present

## 2023-08-06 DIAGNOSIS — I272 Pulmonary hypertension, unspecified: Secondary | ICD-10-CM | POA: Insufficient documentation

## 2023-08-06 NOTE — Progress Notes (Signed)
 ADVANCED HF CLINIC NOTE    ID:  LADELLE TEODORO, DOB [birth year], MRN [id number]    PCP:  [name], MD (last seen 10/24) Cardiologist:  Sherryl Manges, MD (last seen 11/24) Primary HF: Dr. Shirlee Latch (last seen 12/24)  Chief Complaint: CHF   History of Present Illness:  89 y.o. with history of permanent atrial fibrillation, chronic diastolic CHF, CKD stage 3, smoking/prior COPD, renal cell CA s/p right nephrectomy. She has a prior history of HFrEF with nonischemic cardiomyopathy and had a Medtronic ICD placed. However, subsequently her EF has recovered.    06/20, she had left hip ORIF after mechanical fall.  Echo in 06/20 showed EF 60-65%, mild RV dilation with normal systolic function, PASP 93 mmHg.  After she got home from the hospital stay post-ORIF, she noted marked peripheral edema. She was short of breath walking short distances.  +orthopnea. She came back to the hospital 07/20 and was admitted with volume overload. She was diuresed aggressively and lost about 22 lbs. RHC showed pulmonary venous hypertension. PCWP tracing had prominent v-waves in absence of significant MR, suggesting significant diastolic dysfunction. Noted to have significant Fe deficiency anemia, but FOBT was negative during 7/20 hospitalization.   Admitted 11/20 with symptomatic anemia, GI workup concerning for ischemic bowel. 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 Durwin Nora with VVS and conservative management recommended. Capsule endoscopy showed no definite source of bleeding. She was volume overloaded due to significant RV dysfunction and diuresed, she also developed AKI on CKD stage 3.      10/22, she had bilateral CIA stents placed. ABIs in 11/22 showed patent stents. She had a skin cancer removed from her left leg. 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   Echo 2/23 EF 65-70% with mild LVH, D-shaped septum with moderate RV enlargement, and mildly  decreased RV function, PASP 92, moderate-severe TR, dilated IVC, moderate aortic stenosis. Echo in 10/23 showed EF 60-65%, D-shaped septum, mild RV enlargement with normal RV systolic function, PASP 90, moderate-severe TR, mild AS, IVC dilated.   Patient was found to have a neuroendocrine tumor of the pancreatic head in the fall of 2023.  She has been treated with sandostatin monthly.  She also had further GI bleeding from small bowel AVMs in fall 2023. She also had COVID-19 in 10/23 and has been on home oxygen 2L since that time.   Follow up with Dr. Graciela Husbands, long discussion about EOL. Given DNR form and high-voltage therapies inactivated on ICD.  Admit 07/24 with acute on chronic blood loss anemia 2/2 GI bleed. Transfused and given IV iron. Had small bowel enteroscopy which showed 2 nonbleeding angiodysplastic lesions in duodenum and 3 nonbleeding angioplastic lesions in jejunum treated with APC.  Admitted early 09/24 with acute on chronic anemia suspected 2/2 chronic GI blood loss. Hgb down to 6.8. Received 2 u RBCs. GI did not recommend any endoscopic procedures d/t advanced age and comorbidities. Course further c/b acute on chronic CHF. She was diuresed with IV lasix and metolazone. Echo during admit with LVEF 65-70%, D-shaped septum, mildly reduced RV, RVSP 103 mmHg, biatrial enlargement, severe TR, moderate mitral stenosis with mean gradient of 6 mmHg, dilated IVC.  She was seen back in the [facility] on 01/30/23 for post hospital f/u and noted recurrent melena, SOB and fatigued w/ minimal exertion. Also noted to have recurrent volume overload w/ marked return of LEE. Hgb was down to 6.6. She was sent  back to the ED and readmitted again for a/c CHF and GIB, treated w/ blood transfusions, IV Fe and diuretics. Discharged on 02/06/23 on torsemide 100 mg bid.   S/p 2u RBCs 04/26/23 for symptomatic anemia. Hgb 6.8.   Admitted 05/27/23 due to shortness of breath/ leg swelling. Initially given IV lasix  with  removal of ~ 4Land transitioned to oral torsemide at 100mg  BID at discharge. Metolazone decreased to weekly. Treated with antibiotics for leg cellulitis. Given 1 unit PRBC's due to hemoglobin trending down to 7.1. Wound care consulted due to weeping area on left shin. Palliative care also consulted.   She was admitted in 2/25 with lower leg cellulitis.  She was treated with IV abx.   She returns today for followup of CHF.  Hgb is back down to 7.8 and transfusion has been arranged. She has left lower extremity ulceration but it is healing. LLE is wrapped by home health. Overall, swelling is mildly decreased in her legs.  Weight is stable. She is short of breath with most walking, worse when anemic. She has bendopnea and sleeps propped up due to orthopnea.  No chest pain.  No lightheadedness.   ECG (personally reviewed): AF, v-paced  Medtronic device interrogation: stable thoracic impedance, 64% v-paced.   Labs (10/23): magnesium 1.7 Labs (3/24): TSH 2.93 Labs (4/24): K 3.9, creatinine 1.69, ALT 9, hgb 10, Plt 175 Labs (8/24): K 4.5, creatinine, 1.6 Labs (10/24): K 4.1, creatinine 1.55, Hgb 7.6 Labs (11/24): K 4.6, SCr 1.52 Labs (11/24): K 4.6, SCr 1.82 => 1.78, TSat 3, ferritin 16 Labs (12/24): K 3.9, creatinine 1.83 Labs (01/25): K 4.2, creatinine 1.89 Labs (2/25): hgb 8.5, BNP 517, K 3.5, creatinine 1.86 Labs (3/25): K 4, creatinine 2.13, hgb 7.8  PMH: 1. COPD 2. Renal cell carcinoma: S/p right kidney resection.  3. CKD stage 3 4. Fe deficiency anemia 5. Atrial fibrillation: Permanent. She has failed amiodarone, Tikosyn, and flecainide as well as multiple cardioversions. 6. Chronic diastolic CHF: She has history of prior HFrEF with nonischemic cardiomyopathy.  She has a Medtronic ICD.   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  - Echo (6/20): EF 60-65%, mildly dilated RV with PASP 93 mmHg, mild-moderate TR.  - RHC (7/20): mean RA 11, PA 64/18 mean 39, mean PCWP 24 with v waves to 41,  CI 4.21, PVR 1.9 WU.  - Cardiomems placement - RHC (11/20): mean RA 13, PA 63/23 mean 25 with v waves to 47 (MR only mild by echo), mean PCWP 25, CI 3.18, PVR 2.6 WU - Echo (11/21): EF 65-70%, moderate LVH, D-shaped septum with mildly decreased RV systolic function, RVSP 85.9 mmHg, severe RAE, severe TR, mod-sev AS. - Echo (2/23): EF 65-70% with mild LVH, D-shaped septum with moderate RV enlargement, and mildly decreased RV function, PASP 92, moderate-severe TR, dilated IVC, moderate aortic stenosis.  - Echo (10/23): EF 60-65%, D-shaped septum, mild RV enlargement with normal RV systolic function, PASP 90, moderate-severe TR, mild AS, IVC dilated.  - Echo (9/24): LVEF 65-70%, D-shaped septum, mildly reduced RV, RVSP 103 mmHg, biatrial enlargement, severe TR, moderate mitral stenosis with mean gradient of 6 mmHg, dilated IVC - Echo (01/25): EF 65-70%, mild LVH, RV moderately reduced, severely elevated PA pressure 83.9 mmHg, mild MR, moderate MS with mean gradient of 6.0 mmHg, IVC dialted 7. Hyperlipidemia 8. Carotid stenosis: Carotid dopplers (3/19) with 40-59% LICA stenosis.   9. Possible ischemic bowel in 11/20 10. PAD: Mesenteric artery dopplers (11/20) with 70-99% celiac and  SMA stenosis. Conservative management per Dr. Durwin Nora.  - 10/22 bilateral CIA stents.  - 11/22 ABIs: 1.07 left, 0.83 right with patent CIA stents.  11. 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  12. Squamous cell skin cancer.  13. 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  14. COVID 19 in 10/23    Current Outpatient Medications on File Prior to Visit  Medication Sig Dispense Refill   acetaminophen (TYLENOL) 500 MG tablet Take 1,000 mg by mouth every 8 (eight) hours.     Ascorbic Acid (VITAMIN C PO) Take 1 tablet by mouth daily.     Cholecalciferol (VITAMIN D-3 PO) Take 2 capsules by mouth daily.     CRANBERRY PO Take 1 tablet by mouth daily.     Cyanocobalamin (B-12 PO)  Take 1 tablet by mouth daily.     ferrous gluconate (FERGON) 324 MG tablet Take 1 tablet (324 mg total) by mouth daily with breakfast. 30 tablet 3   fluticasone (FLONASE) 50 MCG/ACT nasal spray Place 2 sprays into both nostrils daily as needed for allergies or rhinitis. 16 g 0   metolazone (ZAROXOLYN) 2.5 MG tablet Take 1 tablet (2.5 mg total) by mouth 3 (three) times a week. Every Tues., Thurs and Sat. with 40 meq of potassium 90 tablet 3   Multiple Vitamins-Minerals (HAIR SKIN NAILS PO) Take 1 capsule by mouth daily.     pantoprazole (PROTONIX) 40 MG tablet Take 1 tablet (40 mg total) by mouth daily. 30 tablet 3   Potassium Chloride ER 20 MEQ TBCR Take 1 tablet (20 mEq total) by mouth daily. 30 tablet 2   rosuvastatin (CRESTOR) 10 MG tablet Take 1 tablet (10 mg total) by mouth daily. 30 tablet 0   torsemide (DEMADEX) 20 MG tablet Take 5 tablets (100 mg total) by mouth 2 (two) times daily. 250 tablet 3   No current facility-administered medications on file prior to visit.     Allergies:   Dilaudid [hydromorphone], Fentanyl, Codeine, Morphine and related, Amoxicillin, Hydrocodone, Oxycodone, Tramadol, Whole blood, and Sulfonamide derivatives    Social History:  The patient  reports that she quit smoking about 21 years ago. Her smoking use included cigarettes. She has a 20.00 pack-year smoking history. She has never used smokeless tobacco. She reports that she does not drink alcohol and does not use drugs.    Family History:  The patient's family history includes Breast cancer in her cousin and another family member; Breast cancer (age of onset: 69) in her maternal aunt; Early death in her father; Heart failure in her mother.    ROS:  Please see the history of present illness.   All other systems are personally reviewed and negative.    Vitals:   08/06/23 1418  BP: 138/72  Pulse: 61  SpO2: 100%  Weight: 135 lb 2 oz (61.3 kg)   Wt Readings from Last 3 Encounters:  08/06/23 135 lb 2 oz (61.3  kg)  07/28/23 134 lb 9.6 oz (61.1 kg)  07/24/23 135 lb 12.8 oz (61.6 kg)   Lab Results  Component Value Date   CREATININE 2.13 (H) 08/04/2023   CREATININE 1.70 (H) 07/10/2023   CREATININE 1.86 (H) 06/24/2023    PHYSICAL EXAM: General: NAD Neck: JVP 10 cm with prominent CV wave, no thyromegaly or thyroid nodule.  Lungs: Clear to auscultation bilaterally with normal respiratory effort. CV: Nondisplaced PMI.  Heart regular S1/S2, no S3/S4, 3/6 HSM LLSB.  1+ edema to knees  L>R.  No carotid bruit.  Unable to palpate pedal pulses.  Abdomen: Soft, nontender, no hepatosplenomegaly, no distention.  Skin: Intact without lesions or rashes.  Neurologic: Alert and oriented x 3.  Psych: Normal affect. Extremities: No clubbing or cyanosis.  HEENT: Normal.   ASSESSMENT AND PLAN:  1. Chronic diastolic CHF: With prominent RV failure. Echo in 6/20 showed EF 60-65%, mild RV dilation with normal systolic function, PASP 93 mmHg.  She had remote nonischemic cardiomyopathy with recovery of EF, has Medtronic ICD. Atrial fibrillation likely contributes, but this appears to be permanent now as she has failed multiple anti-arrhythmics and is reasonably rate-controlled. RHC 11/20 showed elevated filling pressures and pulmonary venous hypertension. Prominent v-waves on PCWP tracing likely due to stiff ventricle/diastolic dysfunction as she had only mild MR on echo. Echo 10/23 with EF 60-65%, D-shaped septum, mild RV enlargement with normal RV systolic function, PASP 90, moderate-severe TR, mild AS, IVC dilated. Echo in 9/242 with LVEF 65-70%, D-shaped septum, mildly reduced RV, RVSP 103 mmHg, biatrial enlargement, severe TR, mild AS with mean gradient of 17 mmhg, moderate mitral stenosis with mean gradient of 6 mmHg, dilated IVC. She is RV pacing at a high percentage (64%), but LV EF has remained normal. NYHA class 3 to 3b, worse when she is anemic. She is volume overloaded on exam with prominent CV wave in setting of  severe TR. Weight is stable.  I will continue same diuretic regimen without increase given higher creatinine recently (2.13).  - Continue metolazone 2.5 mg three times weekly.  - Continue torsemide 100 mg bid and KCl 20 daily (40 on metolazone days).  - BMET/BNP on Monday with other labs for oncology.  - Off SGLT2i after UTIs 2. Hypertension: Borderline. elevated, will not add antihypertensives.  3. Pulmonary hypertension:  Based on RHC 11/20, she has primarily pulmonary venous hypertension, not candidate for pulmonary vasodilators.  4. CKD: Stage 4. Off SGLT2i.  BMET today.  5. Chronic anemia/GI bleeding: Due to small bowel AVMs. She has had transfusions and IV Fe.  - She is now off anticoagulation.  6. Atrial fibrillation: Permanent. Rate control is reasonable off nodal blockers. She has failed amiodarone, Tikosyn, and flecainide as well as multiple cardioversions.  - Off Eliquis with recurrent GI bleeding and transfusions.  - Not Watchman candidate due to frailty and poorly compensated CHF.  7. Neuroendocrine tumor of pancreatic head - Treating with bevacizumab.  8. Tricuspid regurgitation: Severe.  Prominent CV waves on JVP noted.  9. Mitral stenosis: Moderate on last echo.  - Follow, not surgical candidate.   Followup in 1 month with me  I spent 31 minutes reviewing records, interviewing/examining patient, and managing orders.    Marca Ancona, 08/06/23

## 2023-08-06 NOTE — Patient Instructions (Signed)
 Medication Changes:  No medication changes today!  Lab Work:  Please have blood work done on Monday at your cancer center appointment.   Follow-Up in: Please follow up with the [facility] in 1 month with Clarisa Kindred, NP and again in 2 months with Dr. Shirlee Latch.  At the [facility], you and your health needs are our priority. We have a designated team specialized in the treatment of Heart Failure. This Care Team includes your primary Heart Failure Specialized Cardiologist (physician), Advanced Practice Providers (APPs- Physician Assistants and Nurse Practitioners), and Pharmacist who all work together to provide you with the care you need, when you need it.   You may see any of the following providers on your designated Care Team at your next follow up:  Dr. Arvilla Meres Dr. Marca Ancona Dr. Dorthula Nettles Dr. Theresia Bough Clarisa Kindred, FNP Enos Fling, RPH-CPP  Please be sure to bring in all your medications bottles to every appointment.   Need to Contact us:  If you have any questions or concerns before your next appointment please send [place] a message through [place] or call our office at [phone].    TO LEAVE A MESSAGE FOR THE NURSE SELECT OPTION 2, PLEASE LEAVE A MESSAGE INCLUDING: YOUR NAME DATE OF BIRTH CALL BACK NUMBER REASON FOR CALL**this is important as we prioritize the call backs  YOU WILL RECEIVE A CALL BACK THE SAME DAY AS LONG AS YOU CALL BEFORE 4:00 PM

## 2023-08-07 ENCOUNTER — Encounter: Payer: Self-pay | Admitting: Nurse Practitioner

## 2023-08-07 ENCOUNTER — Inpatient Hospital Stay: Attending: Nurse Practitioner

## 2023-08-07 ENCOUNTER — Other Ambulatory Visit: Payer: Self-pay | Admitting: *Deleted

## 2023-08-07 ENCOUNTER — Encounter

## 2023-08-07 VITALS — BP 133/56 | HR 65 | Temp 97.7°F | Resp 17

## 2023-08-07 DIAGNOSIS — D3A8 Other benign neuroendocrine tumors: Secondary | ICD-10-CM

## 2023-08-07 DIAGNOSIS — D649 Anemia, unspecified: Secondary | ICD-10-CM

## 2023-08-07 DIAGNOSIS — D500 Iron deficiency anemia secondary to blood loss (chronic): Secondary | ICD-10-CM

## 2023-08-07 MED ORDER — FUROSEMIDE 10 MG/ML IJ SOLN
20.0000 mg | Freq: Once | INTRAMUSCULAR | Status: AC
  Administered 2023-08-07: 20 mg via INTRAVENOUS
  Filled 2023-08-07: qty 2

## 2023-08-07 MED ORDER — ONDANSETRON HCL 4 MG/2ML IJ SOLN
4.0000 mg | Freq: Once | INTRAMUSCULAR | Status: AC
  Administered 2023-08-07: 4 mg via INTRAVENOUS
  Filled 2023-08-07: qty 2

## 2023-08-07 MED ORDER — METHYLPREDNISOLONE SODIUM SUCC 125 MG IJ SOLR
125.0000 mg | Freq: Once | INTRAMUSCULAR | Status: AC | PRN
  Administered 2023-08-07: 40 mg via INTRAVENOUS

## 2023-08-07 MED ORDER — ACETAMINOPHEN 325 MG PO TABS
650.0000 mg | ORAL_TABLET | Freq: Once | ORAL | Status: AC
  Administered 2023-08-07: 650 mg via ORAL
  Filled 2023-08-07: qty 2

## 2023-08-07 MED ORDER — DIPHENHYDRAMINE HCL 25 MG PO CAPS
25.0000 mg | ORAL_CAPSULE | Freq: Once | ORAL | Status: AC
Start: 2023-08-07 — End: 2023-08-07
  Administered 2023-08-07: 25 mg via ORAL
  Filled 2023-08-07: qty 1

## 2023-08-07 MED ORDER — FAMOTIDINE IN NACL 20-0.9 MG/50ML-% IV SOLN
20.0000 mg | Freq: Once | INTRAVENOUS | Status: AC | PRN
  Administered 2023-08-07: 20 mg via INTRAVENOUS

## 2023-08-07 MED ORDER — SODIUM CHLORIDE 0.9% IV SOLUTION
250.0000 mL | INTRAVENOUS | Status: DC
  Administered 2023-08-07: 100 mL via INTRAVENOUS

## 2023-08-07 MED ORDER — FAMOTIDINE IN NACL 20-0.9 MG/50ML-% IV SOLN
20.0000 mg | Freq: Once | INTRAVENOUS | Status: AC
  Administered 2023-08-07: 20 mg via INTRAVENOUS
  Filled 2023-08-07: qty 50

## 2023-08-07 MED ORDER — FAMOTIDINE IN NACL 20-0.9 MG/50ML-% IV SOLN: 20.0000 mg | Freq: Once | INTRAVENOUS | Status: DC

## 2023-08-07 MED ORDER — METHYLPREDNISOLONE SODIUM SUCC 40 MG IJ SOLR
40.0000 mg | Freq: Once | INTRAMUSCULAR | Status: AC
  Administered 2023-08-07: 40 mg via INTRAVENOUS
  Filled 2023-08-07: qty 1

## 2023-08-07 NOTE — Progress Notes (Signed)
 About half way through the blood infusing, patient C/O nausea, chills, and not feeling herself just, "feeling really bad." Pt states she has chills off and on some at home but these seem to be much worse now. Vitals checked - no fever, unable to get adequate blood pressure because of her shaking, But SBP was up to 170s, see flowsheet for all vital sign documentation. Blood stopped, Dr Mosetta Putt notified - per Dr Mosetta Putt give zofran 4mg , pepcid 20mg , and 40mg  solumedrol - once pt is back to baseline, ok to restart blood. Medications given, see MAR for documentation. Pt can only have blood at slow rate, due to this, blood would expire before transfusion able to be completed. Dr Mosetta Putt notified, ok to stop blood and discharge patient once she is feeling back to baseline. At 1800, pt feeling back to baseline and VSS, states she wants to go home,  educated pt to call 911 if she begins to feel bad, pt states she has someone that will be home with her and she knows to call if she feels she needs help.

## 2023-08-07 NOTE — Patient Outreach (Signed)
 Care Coordination   closure  Visit Note   08/07/2023 Name: [name] MRN: [id number] DOB: 1933-09-02  [name] is a 89 y.o. year old female who sees [name], MD for primary care.  Message received from [facility], care guide stating she has been unable to re-establish contact with patient after 3 phone call attempts.  Patient will be closed to care coordination follow up at this time due to being unable to reach.      Goals Addressed             This Visit's Progress    COMPLETED: Education and management of health conditions       Interventions Today    Flowsheet Row Most Recent Value  Chronic Disease   Chronic disease during today's visit Congestive Heart Failure (CHF), Other  [(left foot skin cancer, anemia due to pancreatic tumor and AVM, Bilateral leg weeping/ lesions)]  General Interventions   General Interventions Discussed/Reviewed General Interventions Reviewed, Labs  [evaluation of current treatment plan for listed health conditions and patients adherence to plan as established by provider. Assessed for HF symptoms and/ or new or ongoing symptoms. Assesed leg/foot wound]  Doctor Visits Discussed/Reviewed Doctor Visits Reviewed  Annabell Sabal upcoming provider visits. Advised to keep follow up appointments with providers. Advised to call and reschedule primary care provider visit.]  Education Interventions   Education Provided Provided Education  [reviewed sign/s symptoms of infection. Confirmed patient continues to receive palliative care services. Confirmed patient able to maintain ADL's and has ongoing support at home. Advised to elevate legs when sitting and laying down.]  Provided Verbal Education On Other  [Advised to contact provider for worsening symptoms or call 911 for life threatning symptoms/ worsening SOB.]  Mental Health Interventions   Mental Health Discussed/Reviewed Mental Health Reviewed, Other  [active listening and support.  Offered  to refer to Child psychotherapist for counseling / resources and additional support.]  Pharmacy Interventions   Pharmacy Dicussed/Reviewed Pharmacy Topics Reviewed  [reviewed medications and compliance discussed. Assessed patients pain level. Advised to take antibiotics until completed.]              SDOH assessments and interventions completed:  No     Care Coordination Interventions:  No, not indicated   Follow up plan: No further intervention required.   Encounter Outcome:  Case closed.    George Ina RN, BSN, CCM [employer], Population Health Case Manager Phone: [phone]

## 2023-08-08 LAB — BPAM RBC
Blood Product Expiration Date: 202504292359
ISSUE DATE / TIME: 202504031514
Unit Type and Rh: 202504292359
Unit Type and Rh: 600

## 2023-08-08 LAB — TYPE AND SCREEN
ABO/RH(D): A NEG
Antibody Screen: POSITIVE
Unit division: 0

## 2023-08-10 NOTE — Assessment & Plan Note (Signed)
 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   -Outpatient EUS 03/04/2022 by Dr. 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 Freida Busman did not recommend surgery in multifocal NET and also due to her age and co-morbidities. -She began first line sandostatin injections q28 days on 03/27/22.  -Dotatate PET scan, done 2 weeks after first injection 04/10/22, shows no significant tracer avidity in the pancrease or distant sites. Findings are likely due to close timing relative to the sandostatin injection blocking receptors. When we repeat this for restaging next year, will arrange 4 weeks after sandostatin -She has persistent anemia, likely secondary to GI bleeding from the tumor and AVM, she has had multiple hospital admission for anemia and bleeding.

## 2023-08-10 NOTE — Assessment & Plan Note (Signed)
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 -She previously had reaction to blood transfusion, or required multiple premedications for blood transfusion -She was recently hospitalized for anemia and a GI bleeding in July 2024. -She is currently on ESA every 2 weeks, IV iron as needed, and blood transfusion as needed. -Due to her multiple hospital admission for anemia and GI bleeding, she started bevacizumab for AVM on 02/12/2023

## 2023-08-11 ENCOUNTER — Encounter: Payer: Self-pay | Admitting: Hematology

## 2023-08-11 ENCOUNTER — Inpatient Hospital Stay

## 2023-08-11 ENCOUNTER — Inpatient Hospital Stay: Payer: PPO | Admitting: Hematology

## 2023-08-11 ENCOUNTER — Other Ambulatory Visit: Payer: PPO

## 2023-08-11 ENCOUNTER — Other Ambulatory Visit: Payer: Self-pay

## 2023-08-11 ENCOUNTER — Inpatient Hospital Stay: Payer: PPO

## 2023-08-11 VITALS — BP 158/51 | HR 60 | Temp 98.4°F | Resp 18 | Ht 66.0 in | Wt 139.7 lb

## 2023-08-11 VITALS — BP 146/67 | HR 59 | Resp 18

## 2023-08-11 DIAGNOSIS — L03116 Cellulitis of left lower limb: Secondary | ICD-10-CM | POA: Diagnosis not present

## 2023-08-11 DIAGNOSIS — L03115 Cellulitis of right lower limb: Secondary | ICD-10-CM | POA: Diagnosis not present

## 2023-08-11 DIAGNOSIS — K5520 Angiodysplasia of colon without hemorrhage: Secondary | ICD-10-CM | POA: Diagnosis not present

## 2023-08-11 DIAGNOSIS — D3A8 Other benign neuroendocrine tumors: Secondary | ICD-10-CM | POA: Diagnosis not present

## 2023-08-11 DIAGNOSIS — D500 Iron deficiency anemia secondary to blood loss (chronic): Secondary | ICD-10-CM

## 2023-08-11 DIAGNOSIS — D649 Anemia, unspecified: Secondary | ICD-10-CM

## 2023-08-11 LAB — CBC WITH DIFFERENTIAL (CANCER CENTER ONLY)
Abs Immature Granulocytes: 0.04 10*3/uL (ref 0.00–0.07)
Basophils Absolute: 0 10*3/uL (ref 0.0–0.1)
Basophils Relative: 0 %
Eosinophils Absolute: 0 10*3/uL (ref 0.0–0.5)
Eosinophils Relative: 0 %
HCT: 28.5 % — ABNORMAL LOW (ref 36.0–46.0)
Hemoglobin: 8.3 g/dL — ABNORMAL LOW (ref 12.0–15.0)
Immature Granulocytes: 1 %
Lymphocytes Relative: 9 %
Lymphs Abs: 0.4 10*3/uL — ABNORMAL LOW (ref 0.7–4.0)
MCH: 23.7 pg — ABNORMAL LOW (ref 26.0–34.0)
MCHC: 29.1 g/dL — ABNORMAL LOW (ref 30.0–36.0)
MCV: 81.4 fL (ref 80.0–100.0)
Monocytes Absolute: 0.6 10*3/uL (ref 0.1–1.0)
Monocytes Relative: 12 %
Neutro Abs: 3.6 10*3/uL (ref 1.7–7.7)
Neutrophils Relative %: 78 %
Platelet Count: 194 10*3/uL (ref 150–400)
RBC: 3.5 MIL/uL — ABNORMAL LOW (ref 3.87–5.11)
RDW: 19.9 % — ABNORMAL HIGH (ref 11.5–15.5)
WBC Count: 4.6 10*3/uL (ref 4.0–10.5)
nRBC: 0 % (ref 0.0–0.2)

## 2023-08-11 LAB — TOTAL PROTEIN, URINE DIPSTICK: Protein, ur: NEGATIVE mg/dL

## 2023-08-11 LAB — SAMPLE TO BLOOD BANK

## 2023-08-11 MED ORDER — SODIUM CHLORIDE 0.9 % IV SOLN
5.0000 mg/kg | Freq: Once | INTRAVENOUS | Status: AC
  Administered 2023-08-11: 300 mg via INTRAVENOUS
  Filled 2023-08-11: qty 12

## 2023-08-11 MED ORDER — SODIUM CHLORIDE 0.9 % IV SOLN: Freq: Once | INTRAVENOUS | Status: AC

## 2023-08-11 MED ORDER — CEPHALEXIN 500 MG PO CAPS: 500.0000 mg | ORAL_CAPSULE | Freq: Three times a day (TID) | ORAL | 0 refills | Status: DC

## 2023-08-11 MED ORDER — EPOETIN ALFA-EPBX 40000 UNIT/ML IJ SOLN
40000.0000 [IU] | Freq: Once | INTRAMUSCULAR | Status: AC
  Administered 2023-08-11: 40000 [IU] via SUBCUTANEOUS
  Filled 2023-08-11: qty 1

## 2023-08-11 NOTE — Progress Notes (Signed)
 [facility]   [phone] [phone]   Clinic Follow up Note   Patient Care Team: [name], MD as PCP - General (Family Medicine) Laurey Morale, MD as PCP - Advanced Heart Failure (Cardiology) Antonieta Iba, MD as PCP - Cardiology (Cardiology) Duke Salvia, MD as PCP - Electrophysiology (Cardiology) Antonieta Iba, MD as Consulting Physician (Cardiology) Duke Salvia, MD as Consulting Physician (Cardiology) [name], Latina Craver, MD as Consulting Physician (Vascular Surgery) Creig Hines, MD as Consulting Physician (Hematology and Oncology) Malachy Mood, MD as Consulting Physician (Oncology) Kathyrn Sheriff, [facility] (Inactive) as Pharmacist (Pharmacist)  Date of Service:  08/11/2023  CHIEF COMPLAINT: f/u of anemia, neuroendocrine tumor, leg edema and wound   CURRENT THERAPY:  Bevacizumab every 2 weeks Blood transfusion if hemoglobin less than 8.0 Sandostatin injection every 4 weeks  Oncology History   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   -Outpatient EUS 03/04/2022 by Dr. 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 Freida Busman did not recommend surgery in multifocal NET and also due to her age and co-morbidities. -She began first line sandostatin injections q28 days on 03/27/22.  -Dotatate PET scan, done 2 weeks after first injection 04/10/22, shows no significant tracer avidity in the pancrease or distant sites. Findings are likely due to close timing  relative to the sandostatin injection blocking receptors. When we repeat this for restaging next year, will arrange 4 weeks after sandostatin -She has persistent anemia, likely secondary to GI bleeding from the tumor and AVM, she has had multiple hospital admission for anemia and bleeding.     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 -She previously had reaction to blood transfusion, or required multiple premedications for blood transfusion -She was recently hospitalized for anemia and a GI bleeding in July 2024. -She is currently on ESA every 2 weeks, IV iron as needed, and blood transfusion as needed. -Due to her multiple hospital admission for anemia and GI bleeding, she started bevacizumab for AVM on 02/12/2023   Assessment and Plan    Neuroendocrine tumor -Continue Sandostatin injection every 4 weeks  Transfusion reaction Recent transfusion reaction with symptoms of nausea, vomiting, and cramps. Likely due to the body's response to foreign proteins or small infections from donor blood. She has a history of transfusion reactions. Decision not to label it as a transfusion reaction was made after consultation. - Request blood bank to verify the source and type of blood used in the transfusion.  I spoke with the blood bank staff today during her visit. - Consider transfusion reaction workup if she has reaction in the future  Heart failure Right-sided heart failure with concerns about potential heart damage from transfusion reactions. Symptoms include severe  leg swelling and pain.  Leg swelling and infection Severe leg swelling with infection, pain rated as 10 out of  10, contributing to overall poor health status. -Patient has multiple drug allergies, and does not want to be sedated from narcotics. 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  Follow-up Under cardiologist's care with labs and urine sample requested. Unable to complete urine sample during visit but plans to do so before leaving. - Complete urine sample as requested by the cardiologist.   Goal of care -Patient has multiple medical comorbidities, some are difficult to manage. -We previously discussed her goal of care is palliative, more for her quality of life. -Hospice was discussed with patient and her niece, she is not ready.    Plan -Lab reviewed, no need of blood transfusion this week, we will cancel her appointment. -Proceed with bevacizumab today and continue every 2 weeks for now -I messaged to Dr. Shirlee Latch about his lower extremity cellulitis, he agrees to refer patient to infectious disease, which I have made. -I also called in Keflex for her today -Follow-up in 2 weeks     Discussed the use of AI scribe software for clinical note transcription with the patient, who gave verbal consent to proceed.  History of Present Illness   The patient, an 89 year old female with a history of neuroendocrine tumor and GI bleeding, presents for follow-up. She recently had a blood transfusion, during which she experienced a reaction. The patient and her family member have concerns about the source and type of blood used for the transfusion, and whether it was irradiated. She requests verification that the correct type of blood was used.  The patient describes the transfusion reaction as causing nausea, vomiting, and cramps. She also experienced cognitive difficulties, such as trouble thinking and speaking correctly. The family member notes that the patient was not feeling well after being sent home  post-transfusion.  In addition to the transfusion reaction, the patient is experiencing significant pain and swelling in her legs and feet, rating the pain as a 10. She has one kidney that is not functioning well, and the right side of her heart is also not doing well. She is on her third pacemaker. The patient's legs are full of infection.         All other systems were reviewed with the patient and are negative.  MEDICAL HISTORY:  Past Medical History:  Diagnosis Date   (HFpEF) heart failure with preserved ejection fraction (HCC)    EF=60-60%   Actinic keratosis 01/17/2015   R forearm   Adjustment disorder with anxiety    Adverse effect of other narcotics, sequela    Intolerance to all narcotics   Anti-Duffy antibodies present    Aortic valve stenosis    Arthritis    "some in my hands" (11/11/2012)   Atrial fibrillation, permanent (HCC)    Eliquis   Atypical mole 03/25/2018   L forearm - severe   Automatic implantable cardioverter-defibrillator in situ    Avascular necrosis of hip (HCC) 05/03/2011   Carotid artery stenosis 09/2007   a. 09/2007: 60-79% bilateral (stable); b. 10/2008: 40-59% R 60-79%    Cellulitis of left lower extremity 07/13/2020   CKD (chronic kidney disease), stage III (HCC)    COPD (chronic obstructive pulmonary disease) (HCC)    Coronary artery disease    non-obstructive by 2006 cath   COVID-19 virus infection 02/12/2022   Displaced fracture  of left femoral neck (HCC) 10/09/2018   GI bleed 03/28/2020   AVM   High cholesterol    Hypertension 05/20/2011   Hypertr obst cardiomyop    Hypotension, unspecified    cardiac cath 2006..nonobstructive CAD 30-40s lesions.Marland KitchenETT 1/09 nondiagnostic due to poor HR response..Right Renal Cancer 2003   Iron deficiency anemia    Long term (current) use of anticoagulants    On home oxygen therapy    3L [place]   Osteoarthritis of right hip    PAD (peripheral artery disease) (HCC)    Pancreatic cancer (HCC)    sandostatin    PONV (postoperative nausea and vomiting)    Presence of permanent cardiac pacemaker    Pulmonary HTN (HCC)    RECTAL BLEEDING 10/13/2009   Qualifier: Diagnosis of  By: Wilmon Pali NP, Paula     Red blood cell antibody positive with compatible PRBC difficult to obtain    Anti FYA (Duffy a) antibody. Must be transfused with PRB which are Duffy Antigen Negative and Crossmatch Compatible   Renal cell carcinoma (HCC)    s/p nephrectomy   Squamous cell carcinoma of skin 01/17/2015   R lat wrist   Squamous cell carcinoma of skin 01/29/2018   R post upper leg - superficially invasive   Squamous cell carcinoma of skin 03/25/2018   L lat foot   Squamous cell carcinoma of skin 11/09/2020   left lat foot - EDC 01/01/21, recurrent 01/31/21 - MOHs 02/22/21   Squamous cell carcinoma of skin 12/19/2021   Right Posterior Medial Thigh, EDC   Squamous cell carcinoma of skin 12/19/2021   SCC IS, L lat heel, EDC 02/04/2022   Squamous cell carcinoma of skin 01/21/2022   L forearm, EDC 02/04/2022   Squamous cell carcinoma of skin 01/21/2022   R lower leg below knee, EDC 02/04/2022   Squamous cell carcinoma of skin 01/21/2022   SCCIS, R post heel, EDC 02/04/2022   Squamous cell carcinoma of skin 07/01/2022   left lower abdomen, in situ, EDC   Squamous cell carcinoma of skin 07/01/2022   left medial chest, EDC   Urge incontinence    Venous stasis of both lower extremities     SURGICAL HISTORY: Past Surgical History:  Procedure Laterality Date   ABDOMINAL AORTOGRAM W/LOWER EXTREMITY N/A 02/12/2021   Procedure: ABDOMINAL AORTOGRAM W/LOWER EXTREMITY;  Surgeon: Maeola Harman, MD;  Location: [facility] INVASIVE CV LAB;  Service: Cardiovascular;  Laterality: N/A;   ABDOMINAL HYSTERECTOMY  1975   for benign causes   APPENDECTOMY     BI-VENTRICULAR PACEMAKER UPGRADE  05/04/2010   BIOPSY  02/28/2019   Procedure: BIOPSY;  Surgeon: Rachael Fee, MD;  Location: [facility] ENDOSCOPY;  Service: Endoscopy;;    BIOPSY  03/04/2022   Procedure: BIOPSY;  Surgeon: Lemar Lofty., MD;  Location: [facility] ENDOSCOPY;  Service: Gastroenterology;;   CARDIAC CATHETERIZATION  2006   CARDIOVERSION N/A 02/20/2018   Procedure: CARDIOVERSION;  Surgeon: Antonieta Iba, MD;  Location: [facility] ORS;  Service: Cardiovascular;  Laterality: N/A;   CARDIOVERSION N/A 03/27/2018   Procedure: CARDIOVERSION;  Surgeon: Antonieta Iba, MD;  Location: [facility] ORS;  Service: Cardiovascular;  Laterality: N/A;   CATARACT EXTRACTION W/ INTRAOCULAR LENS  IMPLANT, BILATERAL  01/2006-02-2006   CHOLECYSTECTOMY N/A 11/11/2012   Procedure: LAPAROSCOPIC CHOLECYSTECTOMY WITH INTRAOPERATIVE CHOLANGIOGRAM;  Surgeon: Wilmon Arms. Corliss Skains, MD;  Location: [facility] OR;  Service: General;  Laterality: N/A;   COLONOSCOPY WITH PROPOFOL N/A 02/28/2019   Procedure: COLONOSCOPY WITH PROPOFOL;  Surgeon: Christella Hartigan,  Melton Alar, MD;  Location: [facility] ENDOSCOPY;  Service: Endoscopy;  Laterality: N/A;   ENTEROSCOPY N/A 03/30/2020   Procedure: ENTEROSCOPY;  Surgeon: Shellia Cleverly, DO;  Location: [facility] ENDOSCOPY;  Service: Gastroenterology;  Laterality: N/A;   ENTEROSCOPY N/A 02/10/2021   Procedure: ENTEROSCOPY;  Surgeon: Jeani Hawking, MD;  Location: [facility] ENDOSCOPY;  Service: Endoscopy;  Laterality: N/A;   ENTEROSCOPY N/A 02/15/2022   Procedure: ENTEROSCOPY;  Surgeon: Imogene Burn, MD;  Location: [facility] ENDOSCOPY;  Service: Gastroenterology;  Laterality: N/A;   ENTEROSCOPY N/A 11/06/2022   Procedure: ENTEROSCOPY;  Surgeon: Meridee Score Netty Starring., MD;  Location: [facility] ENDOSCOPY;  Service: Gastroenterology;  Laterality: N/A;   EP IMPLANTABLE DEVICE N/A 02/21/2016   Procedure: ICD Generator Changeout;  Surgeon: Duke Salvia, MD;  Location: [facility] INVASIVE CV LAB;  Service: Cardiovascular;  Laterality: N/A;   ESOPHAGOGASTRODUODENOSCOPY N/A 03/04/2022   Procedure: ESOPHAGOGASTRODUODENOSCOPY (EGD);  Surgeon: Lemar Lofty., MD;  Location: [facility] ENDOSCOPY;  Service: Gastroenterology;   Laterality: N/A;   ESOPHAGOGASTRODUODENOSCOPY (EGD) WITH PROPOFOL N/A 02/28/2019   Procedure: ESOPHAGOGASTRODUODENOSCOPY (EGD) WITH PROPOFOL;  Surgeon: Rachael Fee, MD;  Location: [facility] ENDOSCOPY;  Service: Endoscopy;  Laterality: N/A;   EUS N/A 03/04/2022   Procedure: UPPER ENDOSCOPIC ULTRASOUND (EUS) RADIAL;  Surgeon: Lemar Lofty., MD;  Location: [facility] ENDOSCOPY;  Service: Gastroenterology;  Laterality: N/A;   FINE NEEDLE ASPIRATION N/A 03/04/2022   Procedure: FINE NEEDLE ASPIRATION (FNA) LINEAR;  Surgeon: Lemar Lofty., MD;  Location: [facility] ENDOSCOPY;  Service: Gastroenterology;  Laterality: N/A;   GIVENS CAPSULE STUDY N/A 03/15/2019   Procedure: GIVENS CAPSULE STUDY;  Surgeon: Tressia Danas, MD;  Location: [facility] ENDOSCOPY;  Service: Gastroenterology;  Laterality: N/A;   HOT HEMOSTASIS N/A 03/30/2020   Procedure: HOT HEMOSTASIS (ARGON PLASMA COAGULATION/BICAP);  Surgeon: Shellia Cleverly, DO;  Location: [facility] ENDOSCOPY;  Service: Gastroenterology;  Laterality: N/A;   HOT HEMOSTASIS N/A 02/10/2021   Procedure: HOT HEMOSTASIS (ARGON PLASMA COAGULATION/BICAP);  Surgeon: Jeani Hawking, MD;  Location: [facility] ENDOSCOPY;  Service: Endoscopy;  Laterality: N/A;   HOT HEMOSTASIS N/A 02/15/2022   Procedure: HOT HEMOSTASIS (ARGON PLASMA COAGULATION/BICAP);  Surgeon: Imogene Burn, MD;  Location: [facility] ENDOSCOPY;  Service: Gastroenterology;  Laterality: N/A;   HOT HEMOSTASIS N/A 11/06/2022   Procedure: HOT HEMOSTASIS (ARGON PLASMA COAGULATION/BICAP);  Surgeon: Lemar Lofty., MD;  Location: [facility] ENDOSCOPY;  Service: Gastroenterology;  Laterality: N/A;   INSERT / REPLACE / REMOVE PACEMAKER  05-01-11   02-28-05-/05-04-10-ICD-MEDTRONIC MAXIMAL DR   IR ANGIOGRAM SELECTIVE EACH ADDITIONAL VESSEL  02/18/2022   IR ANGIOGRAM SELECTIVE EACH ADDITIONAL VESSEL  02/18/2022   IR ANGIOGRAM VISCERAL SELECTIVE  02/16/2022   IR ANGIOGRAM VISCERAL SELECTIVE  02/16/2022   IR EMBO ART  VEN HEMORR LYMPH EXTRAV   INC GUIDE ROADMAPPING  02/16/2022   IR THORACENTESIS ASP PLEURAL SPACE W/IMG GUIDE  02/21/2022   IR THORACENTESIS ASP PLEURAL SPACE W/IMG GUIDE  02/22/2022   IR US GUIDE VASC ACCESS RIGHT  02/16/2022   JOINT REPLACEMENT     LAPAROSCOPIC CHOLECYSTECTOMY  11/11/2012   LAPAROSCOPIC LYSIS OF ADHESIONS N/A 11/11/2012   Procedure: LAPAROSCOPIC LYSIS OF ADHESIONS;  Surgeon: Wilmon Arms. Corliss Skains, MD;  Location: [facility] OR;  Service: General;  Laterality: N/A;   NEPHRECTOMY Right 06/2001    S/P RENAL CELL CANCER   PERIPHERAL VASCULAR INTERVENTION Bilateral 02/12/2021   Procedure: PERIPHERAL VASCULAR INTERVENTION;  Surgeon: Maeola Harman, MD;  Location: [facility] INVASIVE CV LAB;  Service: Cardiovascular;  Laterality: Bilateral;  Iliac artery stents  PRESSURE SENSOR/CARDIOMEMS N/A 02/03/2019   Procedure: PRESSURE SENSOR/CARDIOMEMS;  Surgeon: Laurey Morale, MD;  Location: [facility] INVASIVE CV LAB;  Service: Cardiovascular;  Laterality: N/A;   RIGHT HEART CATH N/A 11/09/2018   Procedure: RIGHT HEART CATH;  Surgeon: Laurey Morale, MD;  Location: [facility] INVASIVE CV LAB;  Service: Cardiovascular;  Laterality: N/A;   RIGHT HEART CATH N/A 03/08/2019   Procedure: RIGHT HEART CATH;  Surgeon: Laurey Morale, MD;  Location: [facility] INVASIVE CV LAB;  Service: Cardiovascular;  Laterality: N/A;   SUBMUCOSAL TATTOO INJECTION  02/28/2019   Procedure: SUBMUCOSAL TATTOO INJECTION;  Surgeon: Rachael Fee, MD;  Location: [facility] ENDOSCOPY;  Service: Endoscopy;;   SUBMUCOSAL TATTOO INJECTION  11/06/2022   Procedure: SUBMUCOSAL TATTOO INJECTION;  Surgeon: Lemar Lofty., MD;  Location: [facility] ENDOSCOPY;  Service: Gastroenterology;;   TOTAL HIP ARTHROPLASTY Right 05/03/2011   Procedure: TOTAL HIP ARTHROPLASTY ANTERIOR APPROACH;  Surgeon: Kathryne Hitch;  Location: [facility] ORS;  Service: Orthopedics;  Laterality: Right;  Removal of Cannulated Screws Right Hip, Right Direct Anterior Hip Replacement   TOTAL HIP ARTHROPLASTY Left 10/09/2018    Procedure: TOTAL HIP ARTHROPLASTY ANTERIOR APPROACH;  Surgeon: Samson Frederic, MD;  Location: [facility] OR;  Service: Orthopedics;  Laterality: Left;    I have reviewed the social history and family history with the patient and they are unchanged from previous note.  ALLERGIES:  is allergic to codeine, dilaudid [hydromorphone], tape, amoxil [amoxicillin], asa [aspirin], ms contin [morphine], neurontin [gabapentin], and nsaids.  MEDICATIONS:  Current Outpatient Medications  Medication Sig Dispense Refill   cephALEXin (KEFLEX) 500 MG capsule Take 1 capsule (500 mg total) by mouth 3 (three) times daily. 21 capsule 0   acetaminophen (TYLENOL) 500 MG tablet Take 1,000 mg by mouth every 8 (eight) hours.     Ascorbic Acid (VITAMIN C PO) Take 1 tablet by mouth daily.     Cholecalciferol (VITAMIN D-3 PO) Take 2 capsules by mouth daily.     CRANBERRY PO Take 1 tablet by mouth daily.     Cyanocobalamin (B-12 PO) Take 1 tablet by mouth daily.     ferrous gluconate (FERGON) 324 MG tablet Take 1 tablet (324 mg total) by mouth daily with breakfast. 30 tablet 3   fluticasone (FLONASE) 50 MCG/ACT nasal spray Place 2 sprays into both nostrils daily as needed for allergies or rhinitis. 16 g 0   metolazone (ZAROXOLYN) 2.5 MG tablet Take 1 tablet (2.5 mg total) by mouth 3 (three) times a week. Every Tues., Thurs and Sat. with 40 meq of potassium 90 tablet 3   Multiple Vitamins-Minerals (HAIR SKIN NAILS PO) Take 1 capsule by mouth daily.     pantoprazole (PROTONIX) 40 MG tablet Take 1 tablet (40 mg total) by mouth daily. 30 tablet 3   Potassium Chloride ER 20 MEQ TBCR Take 1 tablet (20 mEq total) by mouth daily. 30 tablet 2   rosuvastatin (CRESTOR) 10 MG tablet Take 1 tablet (10 mg total) by mouth daily. 30 tablet 0   torsemide (DEMADEX) 20 MG tablet Take 5 tablets (100 mg total) by mouth 2 (two) times daily. 250 tablet 3   No current facility-administered medications for this visit.   Facility-Administered  Medications Ordered in Other Visits  Medication Dose Route Frequency Provider Last Rate Last Admin   bevacizumab-adcd (VEGZELMA) 300 mg in sodium chloride 0.9 % 100 mL chemo infusion  5 mg/kg (Treatment Plan Recorded) Intravenous Once Malachy Mood, MD       epoetin alfa-epbx (RETACRIT)  injection 40,000 Units  40,000 Units Subcutaneous Once Malachy Mood, MD        PHYSICAL EXAMINATION: ECOG PERFORMANCE STATUS: 3 - Symptomatic, >50% confined to bed  Vitals:   08/11/23 1151  BP: (!) 158/51  Pulse: 60  Resp: 18  Temp: 98.4 F (36.9 C)  SpO2: 100%   Wt Readings from Last 3 Encounters:  08/11/23 139 lb 11.2 oz (63.4 kg)  08/06/23 135 lb 2 oz (61.3 kg)  07/28/23 134 lb 9.6 oz (61.1 kg)     GENERAL:alert, no distress and comfortable SKIN: see picture of her leg today  EYES: normal, Conjunctiva are pink and non-injected, sclera clear NECK: supple, thyroid normal size, non-tender, without nodularity LYMPH:  no palpable lymphadenopathy in the cervical, axillary  LUNGS: clear to auscultation and percussion with normal breathing effort HEART: regular rate & rhythm and no murmurs and no lower extremity edema ABDOMEN:abdomen soft, non-tender and normal bowel sounds Musculoskeletal:no cyanosis of digits and no clubbing  NEURO: alert & oriented x 3 with fluent speech, no focal motor/sensory deficits        LABORATORY DATA:  I have reviewed the data as listed    Latest Ref Rng & Units 08/11/2023   11:14 AM 08/04/2023   12:56 PM 07/28/2023   10:59 AM  CBC  WBC 4.0 - 10.5 K/uL 4.6  3.9  4.5   Hemoglobin 12.0 - 15.0 g/dL 8.3  7.8  8.2   Hematocrit 36.0 - 46.0 % 28.5  26.7  28.8   Platelets 150 - 400 K/uL 194  202  205         Latest Ref Rng & Units 08/04/2023   12:56 PM 07/10/2023    4:41 PM 06/24/2023    4:21 PM  CMP  Glucose 70 - 99 mg/dL 098  77  119   BUN 8 - 23 mg/dL 59  52  63   Creatinine 0.44 - 1.00 mg/dL 1.47  8.29  5.62   Sodium 135 - 145 mmol/L 135  134  136   Potassium 3.5  - 5.1 mmol/L 4.0  3.8  3.5   Chloride 98 - 111 mmol/L 94  92  92   CO2 22 - 32 mmol/L 32  30  34   Calcium 8.9 - 10.3 mg/dL 9.4  8.9  9.2   Total Protein 6.5 - 8.1 g/dL 7.8   7.3   Total Bilirubin 0.0 - 1.2 mg/dL 0.6   0.7   Alkaline Phos 38 - 126 U/L 51   58   AST 15 - 41 U/L 15   19   ALT 0 - 44 U/L 7   13       RADIOGRAPHIC STUDIES: I have personally reviewed the radiological images as listed and agreed with the findings in the report. No results found.    Orders Placed This Encounter  Procedures   CBC with Differential (Cancer Center Only)    Standing Status:   Future    Expected Date:   08/24/2023    Expiration Date:   08/23/2024   Total Protein, Urine dipstick    Standing Status:   Future    Expected Date:   08/24/2023    Expiration Date:   08/23/2024   CBC with Differential (Cancer Center Only)    Standing Status:   Future    Expected Date:   09/07/2023    Expiration Date:   09/06/2024   Total Protein, Urine dipstick    Standing Status:  Future    Expected Date:   09/07/2023    Expiration Date:   09/06/2024   CBC with Differential (Cancer Center Only)    Standing Status:   Future    Expected Date:   09/21/2023    Expiration Date:   09/20/2024   Total Protein, Urine dipstick    Standing Status:   Future    Expected Date:   09/21/2023    Expiration Date:   09/20/2024   Ambulatory referral to Infectious Disease    Referral Priority:   Urgent    Referral Type:   Consultation    Referral Reason:   Specialty Services Required    Requested Specialty:   Infectious Diseases    Number of Visits Requested:   1   All questions were answered. The patient knows to call the clinic with any problems, questions or concerns. No barriers to learning was detected. The total time spent in the appointment was 50 minutes.     Malachy Mood, MD 08/11/2023

## 2023-08-11 NOTE — Progress Notes (Signed)
 Referral order to [facility] order placed per Dr. Latanya Maudlin request.  Dr. Latanya Maudlin office note faxed to Alfredo Martinez Patient Referral Coordinator w/infectious Disease Team w/[facility].  Spoke w/[facility] (T [phone]) to confirm that the referral order was seen in their referral que.  Rene Kocher confirm that the referral order is in their work que.  Rene Kocher confirmed their fax number [phone]). Faxed confirmation received.

## 2023-08-11 NOTE — Patient Instructions (Signed)
 Bevacizumab Injection What is this medication? BEVACIZUMAB (be va SIZ yoo mab) treats some types of cancer. It works by blocking a protein that causes cancer cells to grow and multiply. This helps to slow or stop the spread of cancer cells. It is a monoclonal antibody. This medicine may be used for other purposes; ask your health care provider or pharmacist if you have questions. COMMON BRAND NAME(S): Alymsys, Avastin, MVASI, Rosaland Lao What should I tell my care team before I take this medication? They need to know if you have any of these conditions: Blood clots Coughing up blood Having or recent surgery Heart failure High blood pressure History of a connection between 2 or more body parts that do not usually connect (fistula) History of a tear in your stomach or intestines Protein in your urine An unusual or allergic reaction to bevacizumab, other medications, foods, dyes, or preservatives Pregnant or trying to get pregnant Breast-feeding How should I use this medication? This medication is injected into a vein. It is given by your care team in a hospital or clinic setting. Talk to your care team the use of this medication in children. Special care may be needed. Overdosage: If you think you have taken too much of this medicine contact a poison control center or emergency room at once. NOTE: This medicine is only for you. Do not share this medicine with others. What if I miss a dose? Keep appointments for follow-up doses. It is important not to miss your dose. Call your care team if you are unable to keep an appointment. What may interact with this medication? Interactions are not expected. This list may not describe all possible interactions. Give your health care provider a list of all the medicines, herbs, non-prescription drugs, or dietary supplements you use. Also tell them if you smoke, drink alcohol, or use illegal drugs. Some items may interact with your medicine. What  should I watch for while using this medication? Your condition will be monitored carefully while you are receiving this medication. You may need blood work while taking this medication. This medication may make you feel generally unwell. This is not uncommon as chemotherapy can affect healthy cells as well as cancer cells. Report any side effects. Continue your course of treatment even though you feel ill unless your care team tells you to stop. This medication may increase your risk to bruise or bleed. Call your care team if you notice any unusual bleeding. Before having surgery, talk to your care team to make sure it is ok. This medication can increase the risk of poor healing of your surgical site or wound. You will need to stop this medication for 28 days before surgery. After surgery, wait at least 28 days before restarting this medication. Make sure the surgical site or wound is healed enough before restarting this medication. Talk to your care team if questions. Talk to your care team if you may be pregnant. Serious birth defects can occur if you take this medication during pregnancy and for 6 months after the last dose. Contraception is recommended while taking this medication and for 6 months after the last dose. Your care team can help you find the option that works for you. Do not breastfeed while taking this medication and for 6 months after the last dose. This medication can cause infertility. Talk to your care team if you are concerned about your fertility. What side effects may I notice from receiving this medication? Side effects that you should  report to your care team as soon as possible: Allergic reactions--skin rash, itching, hives, swelling of the face, lips, tongue, or throat Bleeding--bloody or black, tar-like stools, vomiting blood or brown material that looks like coffee grounds, red or dark brown urine, small red or purple spots on skin, unusual bruising or bleeding Blood  clot--pain, swelling, or warmth in the leg, shortness of breath, chest pain Heart attack--pain or tightness in the chest, shoulders, arms, or jaw, nausea, shortness of breath, cold or clammy skin, feeling faint or lightheaded Heart failure--shortness of breath, swelling of the ankles, feet, or hands, sudden weight gain, unusual weakness or fatigue Increase in blood pressure Infection--fever, chills, cough, sore throat, wounds that don't heal, pain or trouble when passing urine, general feeling of discomfort or being unwell Infusion reactions--chest pain, shortness of breath or trouble breathing, feeling faint or lightheaded Kidney injury--decrease in the amount of urine, swelling of the ankles, hands, or feet Stomach pain that is severe, does not go away, or gets worse Stroke--sudden numbness or weakness of the face, arm, or leg, trouble speaking, confusion, trouble walking, loss of balance or coordination, dizziness, severe headache, change in vision Sudden and severe headache, confusion, change in vision, seizures, which may be signs of posterior reversible encephalopathy syndrome (PRES) Side effects that usually do not require medical attention (report to your care team if they continue or are bothersome): Back pain Change in taste Diarrhea Dry skin Increased tears Nosebleed This list may not describe all possible side effects. Call your doctor for medical advice about side effects. You may report side effects to FDA at 1-800-FDA-1088. Where should I keep my medication? This medication is given in a hospital or clinic. It will not be stored at home. NOTE: This sheet is a summary. It may not cover all possible information. If you have questions about this medicine, talk to your doctor, pharmacist, or health care provider.  2024 Elsevier/Gold Standard (2021-09-07 00:00:00)

## 2023-08-13 ENCOUNTER — Encounter

## 2023-08-19 ENCOUNTER — Encounter: Payer: Self-pay | Admitting: Cardiology

## 2023-08-25 ENCOUNTER — Inpatient Hospital Stay

## 2023-08-25 ENCOUNTER — Inpatient Hospital Stay: Admitting: Nurse Practitioner

## 2023-08-26 ENCOUNTER — Other Ambulatory Visit: Payer: Self-pay

## 2023-08-26 ENCOUNTER — Ambulatory Visit: Admitting: Infectious Diseases

## 2023-08-26 VITALS — BP 139/54 | HR 59 | Temp 97.6°F

## 2023-08-26 DIAGNOSIS — I878 Other specified disorders of veins: Secondary | ICD-10-CM | POA: Diagnosis not present

## 2023-08-26 DIAGNOSIS — L0390 Cellulitis, unspecified: Secondary | ICD-10-CM | POA: Diagnosis not present

## 2023-08-26 DIAGNOSIS — Z95811 Presence of heart assist device: Secondary | ICD-10-CM | POA: Diagnosis not present

## 2023-08-26 DIAGNOSIS — R600 Localized edema: Secondary | ICD-10-CM | POA: Diagnosis not present

## 2023-08-26 DIAGNOSIS — D500 Iron deficiency anemia secondary to blood loss (chronic): Secondary | ICD-10-CM | POA: Diagnosis not present

## 2023-08-26 DIAGNOSIS — Z95810 Presence of automatic (implantable) cardiac defibrillator: Secondary | ICD-10-CM | POA: Diagnosis not present

## 2023-08-26 DIAGNOSIS — Z79899 Other long term (current) drug therapy: Secondary | ICD-10-CM

## 2023-08-26 DIAGNOSIS — I509 Heart failure, unspecified: Secondary | ICD-10-CM

## 2023-08-26 MED ORDER — CEFADROXIL 500 MG PO CAPS: 500.0000 mg | ORAL_CAPSULE | Freq: Two times a day (BID) | ORAL | 0 refills | Status: AC

## 2023-08-26 NOTE — Progress Notes (Signed)
 Patient Active Problem List   Diagnosis Date Noted   FTT (failure to thrive) in adult 06/24/2023   Venous stasis ulcer of left lower leg with edema of left lower leg (HCC) 06/18/2023   Cellulitis of left lower extremity 06/13/2023   Hyponatremia 05/28/2023   Acute on chronic respiratory failure with hypoxia (HCC) 05/28/2023   (HFpEF) heart failure with preserved ejection fraction (HCC)    Renal cell carcinoma s/p nephrectomy 2003(HCC) 11/25/2022   Herpes zoster 09/02/2022   Decreased hearing of right ear 07/25/2022   DNR (do not resuscitate) 07/25/2022   Bilateral impacted cerumen 05/20/2022   Chronic hypoxic respiratory failure, on home oxygen  therapy (HCC) 04/03/2022   Primary pancreatic neuroendocrine tumor 03/20/2022   Osteoporosis 10/11/2021   Squamous cell carcinoma of foot, 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   Acute gout due to renal impairment involving left foot 01/30/2021   Vertigo 12/05/2020   History of blood transfusion reaction 04/17/2020   Angiodysplasia of small intestine    Gastric polyps    GI bleed 03/28/2020   Palliative care by specialist    Advanced directives, counseling/discussion    Normocytic anemia 01/06/2020   History of gastritis 11/11/2019   Chronic venous hypertension w ulceration (HCC)    Black stools    Acute on chronic diastolic CHF (congestive heart failure) (HCC) 11/04/2018   Chronic bilateral pleural effusions 11/04/2018   Iliac artery stenosis, bilateral (HCC) 07/14/2017   Chronic blood loss anemia 11/18/2016   Pulmonary hypertension (HCC) 12/04/2015   B12 deficiency 05/22/2015   PAD (peripheral artery disease) (HCC)    History of nephrectomy secondary to RCC    TIA (transient ischemic attack) 08/16/2014   Other fatigue 01/14/2014   Chronic diastolic CHF (congestive heart failure) (HCC) 06/04/2013   Mitral valve regurgitation 06/04/2013   Allergic rhinitis 01/01/2013   Chronic cholecystitis with calculus 09/15/2012   Claudication (HCC) 01/21/2012    Lightheadedness 11/26/2011   Hypertension 05/20/2011   HOCM/IHSS 07/14/2009   Hyperlipidemia 04/10/2009   INCONTINENCE, URGE 04/10/2009   Hypertrophic cardiomyopathy (HCC) 10/27/2008   Atrial fibrillation, permanent (HCC) 10/27/2008   SINUS BRADYCARDIA 10/27/2008   Carotid artery stenosis 10/27/2008   COPD (chronic obstructive pulmonary disease) (HCC) 10/27/2008   Acute on chronic kidney failure (HCC) 10/27/2008   ICD (implantable cardioverter-defibrillator) in place 10/27/2008    Patient's Medications  New Prescriptions   No medications on file  Previous Medications   ACETAMINOPHEN  (TYLENOL ) 500 MG TABLET    Take 1,000 mg by mouth every 8 (eight) hours.   ASCORBIC ACID  (VITAMIN C  PO)    Take 1 tablet by mouth daily.   CEPHALEXIN  (KEFLEX ) 500 MG CAPSULE    Take 1 capsule (500 mg total) by mouth 3 (three) times daily.   CHOLECALCIFEROL  (VITAMIN D -3 PO)    Take 2 capsules by mouth daily.   CRANBERRY PO    Take 1 tablet by mouth daily.   CYANOCOBALAMIN  (B-12 PO)    Take 1 tablet by mouth daily.   FERROUS GLUCONATE  (FERGON) 324 MG TABLET    Take 1 tablet (324 mg total) by mouth daily with breakfast.   FLUTICASONE  (FLONASE ) 50 MCG/ACT NASAL SPRAY    Place 2 sprays into both nostrils daily as needed for allergies or rhinitis.   METOLAZONE  (ZAROXOLYN ) 2.5 MG TABLET    Take 1 tablet (2.5 mg total) by mouth 3 (three) times a week. Every Tues., Thurs and Sat. with 40 meq of potassium   MULTIPLE VITAMINS-MINERALS (HAIR SKIN NAILS PO)  Take 1 capsule by mouth daily.   PANTOPRAZOLE  (PROTONIX ) 40 MG TABLET    Take 1 tablet (40 mg total) by mouth daily.   POTASSIUM CHLORIDE  ER 20 MEQ TBCR    Take 1 tablet (20 mEq total) by mouth daily.   ROSUVASTATIN  (CRESTOR ) 10 MG TABLET    Take 1 tablet (10 mg total) by mouth daily.   TORSEMIDE  (DEMADEX ) 20 MG TABLET    Take 5 tablets (100 mg total) by mouth 2 (two) times daily.  Modified Medications   No medications on file  Discontinued Medications   No  medications on file    Subjective: Discussed the use of AI scribe software for clinical note transcription with the patient, who gave verbal consent to proceed.   89 Y O Female with h/o pancreatic neuroendocrine tumor diagnosed in 2023 follows Dr Maryalice Smaller, anemia, angiodysplasia of jejunum with bleeding and receiving RBC transfusion and iron  infusions as needed and ESA every 2 weeks + bevacizumab   every 2 weeks, Carotid artery stenosis, A fib, CHF s/p AICD, CAD, PAD, COPD/chronic respiratory failure on o2 via [place], CKD, HTN, Pul HTN,  HLD, squamous cell ca of rt leg/rt posterior heel, left medial chest, rt lateral wrist, left lateral foot/heel, left lateral forearm and anterior abdominal wall, RCC s/p rt nephrectomy in 2003, Rt THA  with removal  and revision in 2020, Left THA, Venous stasis of b/l lower extremities who is referred from Oncology for chronic leg swelling, possible cellulitis.   Her primary concern is the persistent pain and skin lesions on her legs. The patient reports that her legs have been swollen and have had skin lesions for several months. The lesions are painful, drain fluid, and occasionally have an odor. She has been taking cephalexin  for cellulitis and today is last day, but the lesions have not resolved. She also reports history of skin cancer on rt leg, which was surgically treated. The patient's legs cause her significant pain and discomfort, which affects her sleep and quality of life. The patient's niece, who accompanies her, reports that the patient's hemoglobin levels are consistently low, which has required blood transfusions in the past and on a monthly shot for her cancer.  She has not followed up with Dermatology for a while as well as vascular for a long time. Denies fevers, chills. Denies nausea, vomiting, malaise or diarrhea. Denies cough, chest pain and SOB. Denies GU or neurological complaints.   Review of Systems: all systems reviewed with pertinent positives and  negatives as listed above  Past Medical History:  Diagnosis Date   (HFpEF) heart failure with preserved ejection fraction (HCC)    EF=60-60%   Actinic keratosis 01/17/2015   R forearm   Adjustment disorder with anxiety    Adverse effect of other narcotics, sequela    Intolerance to all narcotics   Anti-Duffy antibodies present    Aortic valve stenosis    Arthritis    "some in my hands" (11/11/2012)   Atrial fibrillation, permanent (HCC)    Eliquis    Atypical mole 03/25/2018   L forearm - severe   Automatic implantable cardioverter-defibrillator in situ    Avascular necrosis of hip (HCC) 05/03/2011   Carotid artery stenosis 09/2007   a. 09/2007: 60-79% bilateral (stable); b. 10/2008: 40-59% R 60-79%    Cellulitis of left lower extremity 07/13/2020   CKD (chronic kidney disease), stage III (HCC)    COPD (chronic obstructive pulmonary disease) (HCC)    Coronary artery disease    non-obstructive  by 2006 cath   COVID-19 virus infection 02/12/2022   Displaced fracture of left femoral neck (HCC) 10/09/2018   GI bleed 03/28/2020   AVM   High cholesterol    Hypertension 05/20/2011   Hypertr obst cardiomyop    Hypotension, unspecified    cardiac cath 2006..nonobstructive CAD 30-40s lesions.Aaron AasETT 1/09 nondiagnostic due to poor HR response..Right Renal Cancer 2003   Iron  deficiency anemia    Long term (current) use of anticoagulants    On home oxygen  therapy    3L [place]   Osteoarthritis of right hip    PAD (peripheral artery disease) (HCC)    Pancreatic cancer (HCC)    sandostatin    PONV (postoperative nausea and vomiting)    Presence of permanent cardiac pacemaker    Pulmonary HTN (HCC)    RECTAL BLEEDING 10/13/2009   Qualifier: Diagnosis of  By: Daphane Dynes NP, Paula     Red blood cell antibody positive with compatible PRBC difficult to obtain    Anti FYA (Duffy a) antibody. Must be transfused with PRB which are Duffy Antigen Negative and Crossmatch Compatible   Renal cell carcinoma  (HCC)    s/p nephrectomy   Squamous cell carcinoma of skin 01/17/2015   R lat wrist   Squamous cell carcinoma of skin 01/29/2018   R post upper leg - superficially invasive   Squamous cell carcinoma of skin 03/25/2018   L lat foot   Squamous cell carcinoma of skin 11/09/2020   left lat foot - EDC 01/01/21, recurrent 01/31/21 - MOHs 02/22/21   Squamous cell carcinoma of skin 12/19/2021   Right Posterior Medial Thigh, EDC   Squamous cell carcinoma of skin 12/19/2021   SCC IS, L lat heel, EDC 02/04/2022   Squamous cell carcinoma of skin 01/21/2022   L forearm, EDC 02/04/2022   Squamous cell carcinoma of skin 01/21/2022   R lower leg below knee, EDC 02/04/2022   Squamous cell carcinoma of skin 01/21/2022   SCCIS, R post heel, EDC 02/04/2022   Squamous cell carcinoma of skin 07/01/2022   left lower abdomen, in situ, EDC   Squamous cell carcinoma of skin 07/01/2022   left medial chest, EDC   Urge incontinence    Venous stasis of both lower extremities    Past Surgical History:  Procedure Laterality Date   ABDOMINAL AORTOGRAM W/LOWER EXTREMITY N/A 02/12/2021   Procedure: ABDOMINAL AORTOGRAM W/LOWER EXTREMITY;  Surgeon: Adine Hoof, MD;  Location: [facility] INVASIVE CV LAB;  Service: Cardiovascular;  Laterality: N/A;   ABDOMINAL HYSTERECTOMY  1975   for benign causes   APPENDECTOMY     BI-VENTRICULAR PACEMAKER UPGRADE  05/04/2010   BIOPSY  02/28/2019   Procedure: BIOPSY;  Surgeon: Janel Medford, MD;  Location: [facility] ENDOSCOPY;  Service: Endoscopy;;   BIOPSY  03/04/2022   Procedure: BIOPSY;  Surgeon: Normie Becton., MD;  Location: [facility] ENDOSCOPY;  Service: Gastroenterology;;   CARDIAC CATHETERIZATION  2006   CARDIOVERSION N/A 02/20/2018   Procedure: CARDIOVERSION;  Surgeon: Devorah Fonder, MD;  Location: [facility] ORS;  Service: Cardiovascular;  Laterality: N/A;   CARDIOVERSION N/A 03/27/2018   Procedure: CARDIOVERSION;  Surgeon: Devorah Fonder, MD;  Location: [facility]  ORS;  Service: Cardiovascular;  Laterality: N/A;   CATARACT EXTRACTION W/ INTRAOCULAR LENS  IMPLANT, BILATERAL  01/2006-02-2006   CHOLECYSTECTOMY N/A 11/11/2012   Procedure: LAPAROSCOPIC CHOLECYSTECTOMY WITH INTRAOPERATIVE CHOLANGIOGRAM;  Surgeon: Kari Otto. Eli Grizzle, MD;  Location: [facility] OR;  Service: General;  Laterality: N/A;   COLONOSCOPY WITH PROPOFOL  N/A  02/28/2019   Procedure: COLONOSCOPY WITH PROPOFOL ;  Surgeon: Janel Medford, MD;  Location: [facility] ENDOSCOPY;  Service: Endoscopy;  Laterality: N/A;   ENTEROSCOPY N/A 03/30/2020   Procedure: ENTEROSCOPY;  Surgeon: Annis Kinder, DO;  Location: [facility] ENDOSCOPY;  Service: Gastroenterology;  Laterality: N/A;   ENTEROSCOPY N/A 02/10/2021   Procedure: ENTEROSCOPY;  Surgeon: Alvis Jourdain, MD;  Location: [facility] ENDOSCOPY;  Service: Endoscopy;  Laterality: N/A;   ENTEROSCOPY N/A 02/15/2022   Procedure: ENTEROSCOPY;  Surgeon: Daina Drum, MD;  Location: [facility] ENDOSCOPY;  Service: Gastroenterology;  Laterality: N/A;   ENTEROSCOPY N/A 11/06/2022   Procedure: ENTEROSCOPY;  Surgeon: Brice Campi Albino Alu., MD;  Location: [facility] ENDOSCOPY;  Service: Gastroenterology;  Laterality: N/A;   EP IMPLANTABLE DEVICE N/A 02/21/2016   Procedure: ICD Generator Changeout;  Surgeon: Verona Goodwill, MD;  Location: [facility] INVASIVE CV LAB;  Service: Cardiovascular;  Laterality: N/A;   ESOPHAGOGASTRODUODENOSCOPY N/A 03/04/2022   Procedure: ESOPHAGOGASTRODUODENOSCOPY (EGD);  Surgeon: Normie Becton., MD;  Location: [facility] ENDOSCOPY;  Service: Gastroenterology;  Laterality: N/A;   ESOPHAGOGASTRODUODENOSCOPY (EGD) WITH PROPOFOL  N/A 02/28/2019   Procedure: ESOPHAGOGASTRODUODENOSCOPY (EGD) WITH PROPOFOL ;  Surgeon: Janel Medford, MD;  Location: [facility] ENDOSCOPY;  Service: Endoscopy;  Laterality: N/A;   EUS N/A 03/04/2022   Procedure: UPPER ENDOSCOPIC ULTRASOUND (EUS) RADIAL;  Surgeon: Normie Becton., MD;  Location: [facility] ENDOSCOPY;  Service: Gastroenterology;  Laterality: N/A;   FINE  NEEDLE ASPIRATION N/A 03/04/2022   Procedure: FINE NEEDLE ASPIRATION (FNA) LINEAR;  Surgeon: Normie Becton., MD;  Location: [facility] ENDOSCOPY;  Service: Gastroenterology;  Laterality: N/A;   GIVENS CAPSULE STUDY N/A 03/15/2019   Procedure: GIVENS CAPSULE STUDY;  Surgeon: Lindle Rhea, MD;  Location: [facility] ENDOSCOPY;  Service: Gastroenterology;  Laterality: N/A;   HOT HEMOSTASIS N/A 03/30/2020   Procedure: HOT HEMOSTASIS (ARGON PLASMA COAGULATION/BICAP);  Surgeon: Annis Kinder, DO;  Location: [facility] ENDOSCOPY;  Service: Gastroenterology;  Laterality: N/A;   HOT HEMOSTASIS N/A 02/10/2021   Procedure: HOT HEMOSTASIS (ARGON PLASMA COAGULATION/BICAP);  Surgeon: Alvis Jourdain, MD;  Location: [facility] ENDOSCOPY;  Service: Endoscopy;  Laterality: N/A;   HOT HEMOSTASIS N/A 02/15/2022   Procedure: HOT HEMOSTASIS (ARGON PLASMA COAGULATION/BICAP);  Surgeon: Daina Drum, MD;  Location: [facility] ENDOSCOPY;  Service: Gastroenterology;  Laterality: N/A;   HOT HEMOSTASIS N/A 11/06/2022   Procedure: HOT HEMOSTASIS (ARGON PLASMA COAGULATION/BICAP);  Surgeon: Normie Becton., MD;  Location: [facility] ENDOSCOPY;  Service: Gastroenterology;  Laterality: N/A;   INSERT / REPLACE / REMOVE PACEMAKER  05-01-11   02-28-05-/05-04-10-ICD-MEDTRONIC MAXIMAL DR   IR ANGIOGRAM SELECTIVE EACH ADDITIONAL VESSEL  02/18/2022   IR ANGIOGRAM SELECTIVE EACH ADDITIONAL VESSEL  02/18/2022   IR ANGIOGRAM VISCERAL SELECTIVE  02/16/2022   IR ANGIOGRAM VISCERAL SELECTIVE  02/16/2022   IR EMBO ART  VEN HEMORR LYMPH EXTRAV  INC GUIDE ROADMAPPING  02/16/2022   IR THORACENTESIS ASP PLEURAL SPACE W/IMG GUIDE  02/21/2022   IR THORACENTESIS ASP PLEURAL SPACE W/IMG GUIDE  02/22/2022   IR US  GUIDE VASC ACCESS RIGHT  02/16/2022   JOINT REPLACEMENT     LAPAROSCOPIC CHOLECYSTECTOMY  11/11/2012   LAPAROSCOPIC LYSIS OF ADHESIONS N/A 11/11/2012   Procedure: LAPAROSCOPIC LYSIS OF ADHESIONS;  Surgeon: Kari Otto. Eli Grizzle, MD;  Location: [facility] OR;  Service: General;   Laterality: N/A;   NEPHRECTOMY Right 06/2001    S/P RENAL CELL CANCER   PERIPHERAL VASCULAR INTERVENTION Bilateral 02/12/2021   Procedure: PERIPHERAL VASCULAR INTERVENTION;  Surgeon: Adine Hoof, MD;  Location: [facility] INVASIVE CV LAB;  Service:  Cardiovascular;  Laterality: Bilateral;  Iliac artery stents   PRESSURE SENSOR/CARDIOMEMS N/A 02/03/2019   Procedure: PRESSURE SENSOR/CARDIOMEMS;  Surgeon: Darlis Eisenmenger, MD;  Location: [facility] INVASIVE CV LAB;  Service: Cardiovascular;  Laterality: N/A;   RIGHT HEART CATH N/A 11/09/2018   Procedure: RIGHT HEART CATH;  Surgeon: Darlis Eisenmenger, MD;  Location: [facility] INVASIVE CV LAB;  Service: Cardiovascular;  Laterality: N/A;   RIGHT HEART CATH N/A 03/08/2019   Procedure: RIGHT HEART CATH;  Surgeon: Darlis Eisenmenger, MD;  Location: [facility] INVASIVE CV LAB;  Service: Cardiovascular;  Laterality: N/A;   SUBMUCOSAL TATTOO INJECTION  02/28/2019   Procedure: SUBMUCOSAL TATTOO INJECTION;  Surgeon: Janel Medford, MD;  Location: [facility] ENDOSCOPY;  Service: Endoscopy;;   SUBMUCOSAL TATTOO INJECTION  11/06/2022   Procedure: SUBMUCOSAL TATTOO INJECTION;  Surgeon: Normie Becton., MD;  Location: [facility] ENDOSCOPY;  Service: Gastroenterology;;   TOTAL HIP ARTHROPLASTY Right 05/03/2011   Procedure: TOTAL HIP ARTHROPLASTY ANTERIOR APPROACH;  Surgeon: Arnie Lao;  Location: [facility] ORS;  Service: Orthopedics;  Laterality: Right;  Removal of Cannulated Screws Right Hip, Right Direct Anterior Hip Replacement   TOTAL HIP ARTHROPLASTY Left 10/09/2018   Procedure: TOTAL HIP ARTHROPLASTY ANTERIOR APPROACH;  Surgeon: Adonica Hoose, MD;  Location: [facility] OR;  Service: Orthopedics;  Laterality: Left;    Social History   Tobacco Use   Smoking status: Former    Current packs/day: 0.00    Average packs/day: 0.5 packs/day for 40.0 years (20.0 ttl pk-yrs)    Types: Cigarettes    Start date: 05/06/1961    Quit date: 05/06/2001    Years since quitting: 22.3   Smokeless tobacco:  Never  Vaping Use   Vaping status: Never Used  Substance Use Topics   Alcohol use: No    Alcohol/week: 0.0 standard drinks of alcohol   Drug use: No    Family History  Problem Relation Age of Onset   Heart failure Mother    Early death Father        car accident   Breast cancer Maternal Aunt 64   Breast cancer Cousin    Breast cancer Other    Colon cancer Neg Hx    Esophageal cancer Neg Hx    Liver disease Neg Hx    Pancreatic cancer Neg Hx    Rectal cancer Neg Hx     Allergies  Allergen Reactions   Codeine Nausea And Vomiting and Other (See Comments)    Hallucinations, too- CANNOT HAVE; thinks she may have received Narcan  to reverse the effect   Dilaudid  [Hydromorphone ] Other (See Comments)    Excessive Somnolence- Required Narcan  X2   Tape Other (See Comments)    CAN TOLERATE ONLY EASY-RELEASE, PAPER TAPE, AS THE SKIN IS VERY THIN AND WILL BRUISE AND TEAR VERY EASILY   Amoxil  [Amoxicillin ] Nausea Only   Asa [Aspirin ] Other (See Comments)    Told to avoid due to reduced kidney function, has 1 remaining kidney   Ms Contin  [Morphine ] Nausea And Vomiting and Other (See Comments)    Hallucinations, also   Neurontin  [Gabapentin ] Other (See Comments)    "Out of it"   Nsaids Other (See Comments)    Told to avoid due to reduced kidney function, has 1 remaining kidney    Health Maintenance  Topic Date Due   Zoster Vaccines- Shingrix (1 of 2) 03/11/1953   COVID-19 Vaccine (3 - Pfizer risk series) 08/03/2019   DTaP/Tdap/Td (3 - Td or Tdap) 08/16/2020   MAMMOGRAM  10/09/2021   INFLUENZA VACCINE  12/05/2023   Pneumonia Vaccine 42+ Years old  Completed   DEXA SCAN  Completed   HPV VACCINES  Aged Out   Meningococcal B Vaccine  Aged Out    Objective: BP (!) 139/54   Pulse (!) 59   Temp 97.6 F (36.4 C) (Temporal)   SpO2 98%    Physical Exam Constitutional:      Appearance: Normal appearance.  HENT:     Head: Normocephalic and atraumatic.      Mouth: Mucous  membranes are moist.  Eyes:    Conjunctiva/sclera: Conjunctivae normal.     Pupils: Pupils are equal, round, and b/l symmetrical   Cardiovascular:     Rate and Rhythm: Normal rate and Irregular rhythm.     Heart sounds: s1s2  Pulmonary:     Effort: Pulmonary effort is normal on [place]    Breath sounds: Normal breath sounds.   Abdominal:     General: Non distended     Palpations: soft.   Musculoskeletal:        General: sitting in the wheel chair   Rt posterior leg      Left leg   Left lateral leg   Rt anterior leg   Left anterior leg    Chronic b/l leg swelling. No warmth, mild erythema and tenderness, no crepitus or fluctuance. No signs of nec fasc. She has some scabbed areas in the b/l legs  Skin:    General: Skin is warm and dry.     Comments:  Neurological:     General: sitting in the wheel chair    Mental Status: awake, alert and oriented to person, place, and time.   Psychiatric:        Mood and Affect: Mood normal.   Lab Results Lab Results  Component Value Date   WBC 4.6 08/11/2023   HGB 8.3 (L) 08/11/2023   HCT 28.5 (L) 08/11/2023   MCV 81.4 08/11/2023   PLT 194 08/11/2023    Lab Results  Component Value Date   CREATININE 2.13 (H) 08/04/2023   BUN 59 (H) 08/04/2023   NA 135 08/04/2023   K 4.0 08/04/2023   CL 94 (L) 08/04/2023   CO2 32 08/04/2023    Lab Results  Component Value Date   ALT 7 08/04/2023   AST 15 08/04/2023   ALKPHOS 51 08/04/2023   BILITOT 0.6 08/04/2023    Lab Results  Component Value Date   CHOL 209 (H) 07/24/2022   HDL 42.20 07/24/2022   LDLCALC 141 (H) 07/24/2022   LDLDIRECT 146.0 11/07/2016   TRIG 130.0 07/24/2022   CHOLHDL 5 07/24/2022   No results found for: "LABRPR", "RPRTITER" No results found for: "HIV1RNAQUANT", "HIV1RNAVL", "CD4TABS"   Assessment/Plan # Chronic B/l Lower extremity swelling with possible cellulitis  - in the setting of known h/o venous stasis of bilateral lower extremities  as well  as CHF - She has not seen Vascular for a long time and discussed to fu with them. Referral to Vascular - She will also benefit from evaluation at the wound care center due to venous ulceration/wounds. Referral to wound care center.  - Will send cefadroxil  500 mg po bid for 7 more days to complete 14 days  - Her main issue today is pain, advised for tylenol  OTC as needed or fu with PCP for pain management - Keep legs elevated  - Fu with her dermatology due to prior h/o Squamous cell ca of bilateral legs  -  Fu in 2 weeks   # CHF - Follows Cardiology, Dr Mitzie Anda  # Pancreatic neuroendocrine tumor - Follows Dr Maryalice Smaller - on sandostatin  every 4 weeks   # Chronic blood loss anemia  - on Bevacizumab  for AVM since 02/12/2023  I have personally spent 75 minutes involved in face-to-face and non-face-to-face activities for this patient on the day of the visit. Professional time spent includes the following activities: Preparing to see the patient (review of tests), Obtaining and/or reviewing separately obtained history (admission/discharge record), Performing a medically appropriate examination and/or evaluation , Ordering medications/tests/procedures, referring and communicating with other health care professionals, Documenting clinical information in the EMR, Independently interpreting results (not separately reported), Communicating results to the patient/family/caregiver, Counseling and educating the patient/family/caregiver and Care coordination (not separately reported).   Of note, portions of this note may have been created with voice recognition software. While this note has been edited for accuracy, occasional wrong-word or 'sound-a-like' substitutions may have occurred due to the inherent limitations of voice recognition software.   Melvina Stage, MD [facility] 08/26/2023, 9:47 AM

## 2023-08-27 ENCOUNTER — Inpatient Hospital Stay

## 2023-08-27 ENCOUNTER — Inpatient Hospital Stay: Admitting: Nurse Practitioner

## 2023-08-27 DIAGNOSIS — J449 Chronic obstructive pulmonary disease, unspecified: Secondary | ICD-10-CM | POA: Diagnosis not present

## 2023-08-27 DIAGNOSIS — I5033 Acute on chronic diastolic (congestive) heart failure: Secondary | ICD-10-CM | POA: Diagnosis not present

## 2023-08-27 LAB — CBC
HCT: 31.4 % — ABNORMAL LOW (ref 35.0–45.0)
Hemoglobin: 9 g/dL — ABNORMAL LOW (ref 11.7–15.5)
MCH: 22.4 pg — ABNORMAL LOW (ref 27.0–33.0)
MCHC: 28.7 g/dL — ABNORMAL LOW (ref 32.0–36.0)
MCV: 78.1 fL — ABNORMAL LOW (ref 80.0–100.0)
MPV: 9.6 fL (ref 7.5–12.5)
Platelets: 206 10*3/uL (ref 140–400)
RBC: 4.02 10*6/uL (ref 3.80–5.10)
RDW: 17.4 % — ABNORMAL HIGH (ref 11.0–15.0)
WBC: 4.1 10*3/uL (ref 3.8–10.8)

## 2023-08-27 LAB — C-REACTIVE PROTEIN: CRP: 9.8 mg/L — ABNORMAL HIGH (ref <8.0)

## 2023-08-29 DIAGNOSIS — Z79899 Other long term (current) drug therapy: Secondary | ICD-10-CM | POA: Insufficient documentation

## 2023-08-30 DIAGNOSIS — Z95811 Presence of heart assist device: Secondary | ICD-10-CM | POA: Insufficient documentation

## 2023-08-30 NOTE — Addendum Note (Signed)
 Addended by: Terre Ferri on: 08/30/2023 07:28 AM   Modules accepted: Level of Service

## 2023-09-01 ENCOUNTER — Telehealth: Payer: Self-pay | Admitting: Hematology

## 2023-09-02 ENCOUNTER — Other Ambulatory Visit: Payer: Self-pay

## 2023-09-02 NOTE — Progress Notes (Signed)
 Pt and family declined [facility] palliative care at this time. Palliative referral will be closed, services available upon re-consult.

## 2023-09-03 ENCOUNTER — Emergency Department

## 2023-09-03 ENCOUNTER — Inpatient Hospital Stay
Admission: EM | Admit: 2023-09-03 | Discharge: 2023-09-08 | DRG: 872 | Disposition: A | Discharge status: Home Health | Attending: Student | Admitting: Student

## 2023-09-03 ENCOUNTER — Other Ambulatory Visit: Payer: Self-pay

## 2023-09-03 DIAGNOSIS — N184 Chronic kidney disease, stage 4 (severe): Secondary | ICD-10-CM | POA: Diagnosis present

## 2023-09-03 DIAGNOSIS — I428 Other cardiomyopathies: Secondary | ICD-10-CM | POA: Diagnosis not present

## 2023-09-03 DIAGNOSIS — Z885 Allergy status to narcotic agent status: Secondary | ICD-10-CM

## 2023-09-03 DIAGNOSIS — T502X5A Adverse effect of carbonic-anhydrase inhibitors, benzothiadiazides and other diuretics, initial encounter: Secondary | ICD-10-CM | POA: Diagnosis not present

## 2023-09-03 DIAGNOSIS — I4821 Permanent atrial fibrillation: Secondary | ICD-10-CM | POA: Diagnosis not present

## 2023-09-03 DIAGNOSIS — M47816 Spondylosis without myelopathy or radiculopathy, lumbar region: Secondary | ICD-10-CM | POA: Diagnosis not present

## 2023-09-03 DIAGNOSIS — M48061 Spinal stenosis, lumbar region without neurogenic claudication: Secondary | ICD-10-CM | POA: Diagnosis not present

## 2023-09-03 DIAGNOSIS — M8588 Other specified disorders of bone density and structure, other site: Secondary | ICD-10-CM | POA: Diagnosis not present

## 2023-09-03 DIAGNOSIS — B964 Proteus (mirabilis) (morganii) as the cause of diseases classified elsewhere: Secondary | ICD-10-CM | POA: Diagnosis present

## 2023-09-03 DIAGNOSIS — R079 Chest pain, unspecified: Secondary | ICD-10-CM | POA: Diagnosis not present

## 2023-09-03 DIAGNOSIS — R7881 Bacteremia: Secondary | ICD-10-CM | POA: Diagnosis not present

## 2023-09-03 DIAGNOSIS — L0390 Cellulitis, unspecified: Secondary | ICD-10-CM | POA: Diagnosis not present

## 2023-09-03 DIAGNOSIS — E872 Acidosis, unspecified: Secondary | ICD-10-CM | POA: Diagnosis not present

## 2023-09-03 DIAGNOSIS — Z9981 Dependence on supplemental oxygen: Secondary | ICD-10-CM

## 2023-09-03 DIAGNOSIS — K31819 Angiodysplasia of stomach and duodenum without bleeding: Secondary | ICD-10-CM | POA: Diagnosis present

## 2023-09-03 DIAGNOSIS — K7689 Other specified diseases of liver: Secondary | ICD-10-CM | POA: Diagnosis not present

## 2023-09-03 DIAGNOSIS — K8689 Other specified diseases of pancreas: Secondary | ICD-10-CM | POA: Diagnosis not present

## 2023-09-03 DIAGNOSIS — Z905 Acquired absence of kidney: Secondary | ICD-10-CM

## 2023-09-03 DIAGNOSIS — I2489 Other forms of acute ischemic heart disease: Secondary | ICD-10-CM | POA: Diagnosis not present

## 2023-09-03 DIAGNOSIS — L309 Dermatitis, unspecified: Secondary | ICD-10-CM | POA: Diagnosis present

## 2023-09-03 DIAGNOSIS — K5520 Angiodysplasia of colon without hemorrhage: Secondary | ICD-10-CM | POA: Diagnosis present

## 2023-09-03 DIAGNOSIS — Z7901 Long term (current) use of anticoagulants: Secondary | ICD-10-CM

## 2023-09-03 DIAGNOSIS — L97929 Non-pressure chronic ulcer of unspecified part of left lower leg with unspecified severity: Secondary | ICD-10-CM | POA: Diagnosis present

## 2023-09-03 DIAGNOSIS — I5082 Biventricular heart failure: Secondary | ICD-10-CM | POA: Diagnosis present

## 2023-09-03 DIAGNOSIS — D631 Anemia in chronic kidney disease: Secondary | ICD-10-CM | POA: Diagnosis present

## 2023-09-03 DIAGNOSIS — Z604 Social exclusion and rejection: Secondary | ICD-10-CM | POA: Diagnosis present

## 2023-09-03 DIAGNOSIS — I517 Cardiomegaly: Secondary | ICD-10-CM | POA: Diagnosis not present

## 2023-09-03 DIAGNOSIS — I739 Peripheral vascular disease, unspecified: Secondary | ICD-10-CM | POA: Diagnosis present

## 2023-09-03 DIAGNOSIS — Z8616 Personal history of COVID-19: Secondary | ICD-10-CM

## 2023-09-03 DIAGNOSIS — E871 Hypo-osmolality and hyponatremia: Secondary | ICD-10-CM | POA: Diagnosis not present

## 2023-09-03 DIAGNOSIS — M159 Polyosteoarthritis, unspecified: Secondary | ICD-10-CM | POA: Diagnosis present

## 2023-09-03 DIAGNOSIS — Z6822 Body mass index (BMI) 22.0-22.9, adult: Secondary | ICD-10-CM

## 2023-09-03 DIAGNOSIS — L03116 Cellulitis of left lower limb: Principal | ICD-10-CM

## 2023-09-03 DIAGNOSIS — I080 Rheumatic disorders of both mitral and aortic valves: Secondary | ICD-10-CM | POA: Diagnosis not present

## 2023-09-03 DIAGNOSIS — I4891 Unspecified atrial fibrillation: Secondary | ICD-10-CM | POA: Diagnosis not present

## 2023-09-03 DIAGNOSIS — I878 Other specified disorders of veins: Secondary | ICD-10-CM | POA: Diagnosis present

## 2023-09-03 DIAGNOSIS — Z85828 Personal history of other malignant neoplasm of skin: Secondary | ICD-10-CM

## 2023-09-03 DIAGNOSIS — Z95810 Presence of automatic (implantable) cardiac defibrillator: Secondary | ICD-10-CM | POA: Diagnosis present

## 2023-09-03 DIAGNOSIS — R6520 Severe sepsis without septic shock: Secondary | ICD-10-CM | POA: Diagnosis present

## 2023-09-03 DIAGNOSIS — Z8507 Personal history of malignant neoplasm of pancreas: Secondary | ICD-10-CM

## 2023-09-03 DIAGNOSIS — L97919 Non-pressure chronic ulcer of unspecified part of right lower leg with unspecified severity: Secondary | ICD-10-CM

## 2023-09-03 DIAGNOSIS — J90 Pleural effusion, not elsewhere classified: Secondary | ICD-10-CM | POA: Diagnosis not present

## 2023-09-03 DIAGNOSIS — R001 Bradycardia, unspecified: Secondary | ICD-10-CM | POA: Diagnosis present

## 2023-09-03 DIAGNOSIS — J811 Chronic pulmonary edema: Secondary | ICD-10-CM | POA: Diagnosis not present

## 2023-09-03 DIAGNOSIS — A419 Sepsis, unspecified organism: Secondary | ICD-10-CM | POA: Diagnosis not present

## 2023-09-03 DIAGNOSIS — E639 Nutritional deficiency, unspecified: Secondary | ICD-10-CM | POA: Diagnosis present

## 2023-09-03 DIAGNOSIS — N189 Chronic kidney disease, unspecified: Secondary | ICD-10-CM | POA: Diagnosis not present

## 2023-09-03 DIAGNOSIS — E7800 Pure hypercholesterolemia, unspecified: Secondary | ICD-10-CM | POA: Diagnosis present

## 2023-09-03 DIAGNOSIS — I2723 Pulmonary hypertension due to lung diseases and hypoxia: Secondary | ICD-10-CM | POA: Diagnosis not present

## 2023-09-03 DIAGNOSIS — L03115 Cellulitis of right lower limb: Secondary | ICD-10-CM | POA: Diagnosis present

## 2023-09-03 DIAGNOSIS — Z888 Allergy status to other drugs, medicaments and biological substances status: Secondary | ICD-10-CM

## 2023-09-03 DIAGNOSIS — Z66 Do not resuscitate: Secondary | ICD-10-CM | POA: Diagnosis present

## 2023-09-03 DIAGNOSIS — A4159 Other Gram-negative sepsis: Secondary | ICD-10-CM | POA: Diagnosis not present

## 2023-09-03 DIAGNOSIS — D649 Anemia, unspecified: Secondary | ICD-10-CM | POA: Diagnosis not present

## 2023-09-03 DIAGNOSIS — I130 Hypertensive heart and chronic kidney disease with heart failure and stage 1 through stage 4 chronic kidney disease, or unspecified chronic kidney disease: Secondary | ICD-10-CM | POA: Diagnosis present

## 2023-09-03 DIAGNOSIS — D509 Iron deficiency anemia, unspecified: Secondary | ICD-10-CM | POA: Diagnosis present

## 2023-09-03 DIAGNOSIS — I3139 Other pericardial effusion (noninflammatory): Secondary | ICD-10-CM | POA: Diagnosis present

## 2023-09-03 DIAGNOSIS — R509 Fever, unspecified: Secondary | ICD-10-CM | POA: Diagnosis not present

## 2023-09-03 DIAGNOSIS — Z96643 Presence of artificial hip joint, bilateral: Secondary | ICD-10-CM | POA: Diagnosis present

## 2023-09-03 DIAGNOSIS — B9689 Other specified bacterial agents as the cause of diseases classified elsewhere: Secondary | ICD-10-CM | POA: Diagnosis not present

## 2023-09-03 DIAGNOSIS — R0689 Other abnormalities of breathing: Secondary | ICD-10-CM | POA: Diagnosis not present

## 2023-09-03 DIAGNOSIS — Z85528 Personal history of other malignant neoplasm of kidney: Secondary | ICD-10-CM

## 2023-09-03 DIAGNOSIS — Z1152 Encounter for screening for COVID-19: Secondary | ICD-10-CM | POA: Diagnosis not present

## 2023-09-03 DIAGNOSIS — I2510 Atherosclerotic heart disease of native coronary artery without angina pectoris: Secondary | ICD-10-CM | POA: Diagnosis present

## 2023-09-03 DIAGNOSIS — R109 Unspecified abdominal pain: Secondary | ICD-10-CM | POA: Diagnosis not present

## 2023-09-03 DIAGNOSIS — J9611 Chronic respiratory failure with hypoxia: Secondary | ICD-10-CM | POA: Diagnosis not present

## 2023-09-03 DIAGNOSIS — D3A8 Other benign neuroendocrine tumors: Secondary | ICD-10-CM | POA: Diagnosis present

## 2023-09-03 DIAGNOSIS — Z803 Family history of malignant neoplasm of breast: Secondary | ICD-10-CM

## 2023-09-03 DIAGNOSIS — I10 Essential (primary) hypertension: Secondary | ICD-10-CM | POA: Diagnosis present

## 2023-09-03 DIAGNOSIS — J449 Chronic obstructive pulmonary disease, unspecified: Secondary | ICD-10-CM | POA: Diagnosis present

## 2023-09-03 DIAGNOSIS — Z880 Allergy status to penicillin: Secondary | ICD-10-CM

## 2023-09-03 DIAGNOSIS — Z886 Allergy status to analgesic agent status: Secondary | ICD-10-CM

## 2023-09-03 DIAGNOSIS — R0989 Other specified symptoms and signs involving the circulatory and respiratory systems: Secondary | ICD-10-CM | POA: Diagnosis not present

## 2023-09-03 DIAGNOSIS — R0902 Hypoxemia: Secondary | ICD-10-CM | POA: Diagnosis not present

## 2023-09-03 DIAGNOSIS — I5030 Unspecified diastolic (congestive) heart failure: Secondary | ICD-10-CM | POA: Diagnosis present

## 2023-09-03 DIAGNOSIS — I959 Hypotension, unspecified: Secondary | ICD-10-CM | POA: Diagnosis not present

## 2023-09-03 DIAGNOSIS — I482 Chronic atrial fibrillation, unspecified: Secondary | ICD-10-CM

## 2023-09-03 DIAGNOSIS — R590 Localized enlarged lymph nodes: Secondary | ICD-10-CM | POA: Diagnosis not present

## 2023-09-03 DIAGNOSIS — Z515 Encounter for palliative care: Secondary | ICD-10-CM

## 2023-09-03 DIAGNOSIS — Z8249 Family history of ischemic heart disease and other diseases of the circulatory system: Secondary | ICD-10-CM

## 2023-09-03 DIAGNOSIS — I5032 Chronic diastolic (congestive) heart failure: Secondary | ICD-10-CM | POA: Diagnosis present

## 2023-09-03 DIAGNOSIS — M549 Dorsalgia, unspecified: Secondary | ICD-10-CM | POA: Diagnosis not present

## 2023-09-03 DIAGNOSIS — R54 Age-related physical debility: Secondary | ICD-10-CM | POA: Diagnosis present

## 2023-09-03 DIAGNOSIS — I872 Venous insufficiency (chronic) (peripheral): Secondary | ICD-10-CM | POA: Diagnosis present

## 2023-09-03 DIAGNOSIS — Z90710 Acquired absence of both cervix and uterus: Secondary | ICD-10-CM

## 2023-09-03 DIAGNOSIS — D638 Anemia in other chronic diseases classified elsewhere: Secondary | ICD-10-CM | POA: Diagnosis present

## 2023-09-03 DIAGNOSIS — Q600 Renal agenesis, unilateral: Secondary | ICD-10-CM | POA: Diagnosis not present

## 2023-09-03 DIAGNOSIS — Z87891 Personal history of nicotine dependence: Secondary | ICD-10-CM

## 2023-09-03 DIAGNOSIS — Z91048 Other nonmedicinal substance allergy status: Secondary | ICD-10-CM

## 2023-09-03 LAB — COMPREHENSIVE METABOLIC PANEL WITH GFR
ALT: 10 U/L (ref 0–44)
AST: 21 U/L (ref 15–41)
Albumin: 3.6 g/dL (ref 3.5–5.0)
Alkaline Phosphatase: 61 U/L (ref 38–126)
Anion gap: 14 (ref 5–15)
BUN: 50 mg/dL — ABNORMAL HIGH (ref 8–23)
CO2: 29 mmol/L (ref 22–32)
Calcium: 9.1 mg/dL (ref 8.9–10.3)
Chloride: 91 mmol/L — ABNORMAL LOW (ref 98–111)
Creatinine, Ser: 1.6 mg/dL — ABNORMAL HIGH (ref 0.44–1.00)
GFR, Estimated: 31 mL/min — ABNORMAL LOW (ref >60)
Glucose, Bld: 74 mg/dL (ref 70–99)
Potassium: 3.7 mmol/L (ref 3.5–5.1)
Sodium: 134 mmol/L — ABNORMAL LOW (ref 135–145)
Total Bilirubin: 1.1 mg/dL (ref 0.0–1.2)
Total Protein: 7.8 g/dL (ref 6.5–8.1)

## 2023-09-03 LAB — CBC WITH DIFFERENTIAL/PLATELET
Abs Immature Granulocytes: 0.03 10*3/uL (ref 0.00–0.07)
Basophils Absolute: 0 10*3/uL (ref 0.0–0.1)
Basophils Relative: 0 %
Eosinophils Absolute: 0 10*3/uL (ref 0.0–0.5)
Eosinophils Relative: 0 %
HCT: 33.7 % — ABNORMAL LOW (ref 36.0–46.0)
Hemoglobin: 9.7 g/dL — ABNORMAL LOW (ref 12.0–15.0)
Immature Granulocytes: 0 %
Lymphocytes Relative: 10 %
Lymphs Abs: 1.1 10*3/uL (ref 0.7–4.0)
MCH: 23 pg — ABNORMAL LOW (ref 26.0–34.0)
MCHC: 28.8 g/dL — ABNORMAL LOW (ref 30.0–36.0)
MCV: 79.9 fL — ABNORMAL LOW (ref 80.0–100.0)
Monocytes Absolute: 0.9 10*3/uL (ref 0.1–1.0)
Monocytes Relative: 8 %
Neutro Abs: 9.2 10*3/uL — ABNORMAL HIGH (ref 1.7–7.7)
Neutrophils Relative %: 82 %
Platelets: 273 10*3/uL (ref 150–400)
RBC: 4.22 MIL/uL (ref 3.87–5.11)
RDW: 19.4 % — ABNORMAL HIGH (ref 11.5–15.5)
WBC: 11.2 10*3/uL — ABNORMAL HIGH (ref 4.0–10.5)
nRBC: 0 % (ref 0.0–0.2)

## 2023-09-03 LAB — URINALYSIS, W/ REFLEX TO CULTURE (INFECTION SUSPECTED)
Bilirubin Urine: NEGATIVE
Glucose, UA: NEGATIVE mg/dL
Hgb urine dipstick: NEGATIVE
Ketones, ur: NEGATIVE mg/dL
Leukocytes,Ua: NEGATIVE
Nitrite: NEGATIVE
Protein, ur: NEGATIVE mg/dL
Specific Gravity, Urine: 1.009 (ref 1.005–1.030)
Squamous Epithelial / HPF: 0 /HPF (ref 0–5)
pH: 5 (ref 5.0–8.0)

## 2023-09-03 LAB — TROPONIN I (HIGH SENSITIVITY): Troponin I (High Sensitivity): 35 ng/L — ABNORMAL HIGH (ref <18)

## 2023-09-03 LAB — LACTIC ACID, PLASMA
Lactic Acid, Venous: 3.4 mmol/L (ref 0.5–1.9)
Lactic Acid, Venous: 1.2 mmol/L (ref 0.5–1.9)

## 2023-09-03 LAB — PROTIME-INR
INR: 1.2 (ref 0.8–1.2)
Prothrombin Time: 15.7 s — ABNORMAL HIGH (ref 11.4–15.2)

## 2023-09-03 LAB — LIPASE, BLOOD: Lipase: 43 U/L (ref 11–51)

## 2023-09-03 LAB — RESP PANEL BY RT-PCR (RSV, FLU A&B, COVID)  RVPGX2
Influenza A by PCR: NEGATIVE
Influenza B by PCR: NEGATIVE
Resp Syncytial Virus by PCR: NEGATIVE
SARS Coronavirus 2 by RT PCR: NEGATIVE

## 2023-09-03 MED ORDER — FENTANYL CITRATE PF 50 MCG/ML IJ SOSY
12.5000 ug | PREFILLED_SYRINGE | INTRAMUSCULAR | Status: DC | PRN
  Administered 2023-09-04 – 2023-09-08 (×12): 12.5 ug via INTRAVENOUS
  Filled 2023-09-03 (×14): qty 1

## 2023-09-03 MED ORDER — ACETAMINOPHEN 650 MG RE SUPP: 650.0000 mg | Freq: Four times a day (QID) | RECTAL | Status: DC | PRN

## 2023-09-03 MED ORDER — ACETAMINOPHEN 10 MG/ML IV SOLN
1000.0000 mg | INTRAVENOUS | Status: AC
  Administered 2023-09-03: 1000 mg via INTRAVENOUS
  Filled 2023-09-03: qty 100

## 2023-09-03 MED ORDER — VITAMIN C 500 MG PO TABS
500.0000 mg | ORAL_TABLET | Freq: Every day | ORAL | Status: DC
  Administered 2023-09-03 – 2023-09-08 (×6): 500 mg via ORAL
  Filled 2023-09-03 (×6): qty 1

## 2023-09-03 MED ORDER — METOLAZONE 2.5 MG PO TABS
2.5000 mg | ORAL_TABLET | ORAL | Status: DC
  Administered 2023-09-03: 2.5 mg via ORAL
  Filled 2023-09-03 (×2): qty 1

## 2023-09-03 MED ORDER — ACETAMINOPHEN 325 MG PO TABS
650.0000 mg | ORAL_TABLET | Freq: Four times a day (QID) | ORAL | Status: DC | PRN
  Administered 2023-09-04: 650 mg via ORAL
  Filled 2023-09-03 (×3): qty 2

## 2023-09-03 MED ORDER — VANCOMYCIN HCL IN DEXTROSE 1-5 GM/200ML-% IV SOLN: 1000.0000 mg | INTRAVENOUS | Status: DC

## 2023-09-03 MED ORDER — SODIUM CHLORIDE 0.9% FLUSH: 3.0000 mL | INTRAVENOUS | Status: DC | PRN

## 2023-09-03 MED ORDER — POTASSIUM CHLORIDE CRYS ER 20 MEQ PO TBCR
20.0000 meq | EXTENDED_RELEASE_TABLET | Freq: Every day | ORAL | Status: DC
  Administered 2023-09-03: 20 meq via ORAL
  Filled 2023-09-03 (×2): qty 1

## 2023-09-03 MED ORDER — BISACODYL 5 MG PO TBEC
10.0000 mg | DELAYED_RELEASE_TABLET | Freq: Every day | ORAL | Status: DC | PRN
  Filled 2023-09-03: qty 2

## 2023-09-03 MED ORDER — METRONIDAZOLE 500 MG/100ML IV SOLN
500.0000 mg | Freq: Once | INTRAVENOUS | Status: AC
  Administered 2023-09-03: 500 mg via INTRAVENOUS
  Filled 2023-09-03: qty 100

## 2023-09-03 MED ORDER — SODIUM CHLORIDE 0.9 % IV SOLN
2.0000 g | INTRAVENOUS | Status: DC
  Administered 2023-09-04 – 2023-09-05 (×2): 2 g via INTRAVENOUS
  Filled 2023-09-03 (×2): qty 12.5

## 2023-09-03 MED ORDER — VANCOMYCIN HCL IN DEXTROSE 1-5 GM/200ML-% IV SOLN
1000.0000 mg | Freq: Once | INTRAVENOUS | Status: AC
  Administered 2023-09-03: 1000 mg via INTRAVENOUS
  Filled 2023-09-03: qty 200

## 2023-09-03 MED ORDER — TORSEMIDE 20 MG PO TABS
40.0000 mg | ORAL_TABLET | Freq: Every day | ORAL | Status: DC
  Filled 2023-09-03: qty 2

## 2023-09-03 MED ORDER — SODIUM CHLORIDE 0.9 % IV BOLUS
250.0000 mL | Freq: Once | INTRAVENOUS | Status: AC
  Administered 2023-09-03: 250 mL via INTRAVENOUS

## 2023-09-03 MED ORDER — SODIUM CHLORIDE 0.9 % IV SOLN: 250.0000 mL | INTRAVENOUS | Status: AC | PRN

## 2023-09-03 MED ORDER — ENOXAPARIN SODIUM 30 MG/0.3ML IJ SOSY
30.0000 mg | PREFILLED_SYRINGE | INTRAMUSCULAR | Status: DC
  Administered 2023-09-03 – 2023-09-07 (×5): 30 mg via SUBCUTANEOUS
  Filled 2023-09-03 (×5): qty 0.3

## 2023-09-03 MED ORDER — MEDIHONEY WOUND/BURN DRESSING EX PSTE
1.0000 | PASTE | Freq: Every day | CUTANEOUS | Status: DC
  Filled 2023-09-03 (×2): qty 44

## 2023-09-03 MED ORDER — ROSUVASTATIN CALCIUM 10 MG PO TABS
10.0000 mg | ORAL_TABLET | Freq: Every day | ORAL | Status: DC
Start: 2023-09-04 — End: 2023-09-08
  Administered 2023-09-04 – 2023-09-08 (×5): 10 mg via ORAL
  Filled 2023-09-03 (×5): qty 1

## 2023-09-03 MED ORDER — METHOCARBAMOL 500 MG PO TABS
500.0000 mg | ORAL_TABLET | Freq: Three times a day (TID) | ORAL | Status: DC | PRN
  Administered 2023-09-06: 500 mg via ORAL
  Filled 2023-09-03 (×2): qty 1

## 2023-09-03 MED ORDER — SODIUM CHLORIDE 0.9% FLUSH
3.0000 mL | Freq: Two times a day (BID) | INTRAVENOUS | Status: DC
  Administered 2023-09-03 – 2023-09-08 (×10): 3 mL via INTRAVENOUS

## 2023-09-03 MED ORDER — BISACODYL 10 MG RE SUPP: 10.0000 mg | Freq: Every day | RECTAL | Status: DC | PRN

## 2023-09-03 MED ORDER — METHOCARBAMOL 500 MG PO TABS
500.0000 mg | ORAL_TABLET | Freq: Once | ORAL | Status: AC
  Administered 2023-09-03: 500 mg via ORAL
  Filled 2023-09-03: qty 1

## 2023-09-03 MED ORDER — POLYETHYLENE GLYCOL 3350 17 G PO PACK
17.0000 g | PACK | Freq: Two times a day (BID) | ORAL | Status: DC
  Filled 2023-09-03 (×9): qty 1

## 2023-09-03 MED ORDER — PANTOPRAZOLE SODIUM 40 MG PO TBEC
40.0000 mg | DELAYED_RELEASE_TABLET | Freq: Every day | ORAL | Status: DC
  Administered 2023-09-03 – 2023-09-08 (×6): 40 mg via ORAL
  Filled 2023-09-03 (×6): qty 1

## 2023-09-03 MED ORDER — TORSEMIDE 20 MG PO TABS: 100.0000 mg | ORAL_TABLET | Freq: Two times a day (BID) | ORAL | Status: DC

## 2023-09-03 MED ORDER — SODIUM CHLORIDE 0.9 % IV SOLN
2.0000 g | Freq: Once | INTRAVENOUS | Status: AC
  Administered 2023-09-03: 2 g via INTRAVENOUS
  Filled 2023-09-03: qty 12.5

## 2023-09-03 MED ORDER — SODIUM CHLORIDE 0.9 % IV BOLUS (SEPSIS)
1000.0000 mL | Freq: Once | INTRAVENOUS | Status: AC
  Administered 2023-09-03: 1000 mL via INTRAVENOUS

## 2023-09-03 NOTE — ED Triage Notes (Signed)
 Called out to patient's residence for bilateral leg swelling/wounds; currently being seen with wound care.   ETCO2 20's Temp 101

## 2023-09-03 NOTE — ED Provider Notes (Signed)
 [facility] Provider Note    Event Date/Time   First MD Initiated Contact with Patient 09/03/23 1531     (approximate)   History   Chief Complaint: Wound Infection   HPI  [name] is a 89 y.o. female with a history of heart failure, CKD COPD on 2 L nasal cannula at home, hypertension who was brought to the ED due to bilateral leg swelling and pain along with lower abdominal pain starting yesterday.  Patient has been seeing wound care and treated for cellulitis in the left leg over the past 2 weeks without improvement.  Denies chest pain or shortness of breath.        Past Medical History:  Diagnosis Date   (HFpEF) heart failure with preserved ejection fraction (HCC)    EF=60-60%   Actinic keratosis 01/17/2015   R forearm   Adjustment disorder with anxiety    Adverse effect of other narcotics, sequela    Intolerance to all narcotics   Anti-Duffy antibodies present    Aortic valve stenosis    Arthritis    "some in my hands" (11/11/2012)   Atrial fibrillation, permanent (HCC)    Eliquis    Atypical mole 03/25/2018   L forearm - severe   Automatic implantable cardioverter-defibrillator in situ    Avascular necrosis of hip (HCC) 05/03/2011   Carotid artery stenosis 09/2007   a. 09/2007: 60-79% bilateral (stable); b. 10/2008: 40-59% R 60-79%    Cellulitis of left lower extremity 07/13/2020   CKD (chronic kidney disease), stage III (HCC)    COPD (chronic obstructive pulmonary disease) (HCC)    Coronary artery disease    non-obstructive by 2006 cath   COVID-19 virus infection 02/12/2022   Displaced fracture of left femoral neck (HCC) 10/09/2018   GI bleed 03/28/2020   AVM   High cholesterol    Hypertension 05/20/2011   Hypertr obst cardiomyop    Hypotension, unspecified    cardiac cath 2006..nonobstructive CAD 30-40s lesions.Aaron AasETT 1/09 nondiagnostic due to poor HR response..Right Renal Cancer 2003   Iron  deficiency anemia    Long term  (current) use of anticoagulants    On home oxygen  therapy    3L [place]   Osteoarthritis of right hip    PAD (peripheral artery disease) (HCC)    Pancreatic cancer (HCC)    sandostatin    PONV (postoperative nausea and vomiting)    Presence of permanent cardiac pacemaker    Pulmonary HTN (HCC)    RECTAL BLEEDING 10/13/2009   Qualifier: Diagnosis of  By: Daphane Dynes NP, Paula     Red blood cell antibody positive with compatible PRBC difficult to obtain    Anti FYA (Duffy a) antibody. Must be transfused with PRB which are Duffy Antigen Negative and Crossmatch Compatible   Renal cell carcinoma (HCC)    s/p nephrectomy   Squamous cell carcinoma of skin 01/17/2015   R lat wrist   Squamous cell carcinoma of skin 01/29/2018   R post upper leg - superficially invasive   Squamous cell carcinoma of skin 03/25/2018   L lat foot   Squamous cell carcinoma of skin 11/09/2020   left lat foot - EDC 01/01/21, recurrent 01/31/21 - MOHs 02/22/21   Squamous cell carcinoma of skin 12/19/2021   Right Posterior Medial Thigh, EDC   Squamous cell carcinoma of skin 12/19/2021   SCC IS, L lat heel, EDC 02/04/2022   Squamous cell carcinoma of skin 01/21/2022   L forearm, EDC 02/04/2022   Squamous  cell carcinoma of skin 01/21/2022   R lower leg below knee, EDC 02/04/2022   Squamous cell carcinoma of skin 01/21/2022   SCCIS, R post heel, EDC 02/04/2022   Squamous cell carcinoma of skin 07/01/2022   left lower abdomen, in situ, EDC   Squamous cell carcinoma of skin 07/01/2022   left medial chest, EDC   Urge incontinence    Venous stasis of both lower extremities     Current Outpatient Rx   Order #: [id number] Class: Historical Med   Order #: [id number] Class: Historical Med   Order #: [id number] Class: Normal   Order #: [id number] Class: Historical Med   Order #: [id number] Class: Historical Med   Order #: [id number] Class: Historical Med   Order #: [id number] Class: Normal   Order #: [id number] Class: Phone In   Order #:  [id number] Class: Normal   Order #: [id number] Class: Historical Med   Order #: [id number] Class: Normal   Order #: [id number] Class: Normal   Order #: [id number] Class: Normal   Order #: [id number] Class: Normal    Past Surgical History:  Procedure Laterality Date   ABDOMINAL AORTOGRAM W/LOWER EXTREMITY N/A 02/12/2021   Procedure: ABDOMINAL AORTOGRAM W/LOWER EXTREMITY;  Surgeon: Adine Hoof, MD;  Location: [facility] INVASIVE CV LAB;  Service: Cardiovascular;  Laterality: N/A;   ABDOMINAL HYSTERECTOMY  1975   for benign causes   APPENDECTOMY     BI-VENTRICULAR PACEMAKER UPGRADE  05/04/2010   BIOPSY  02/28/2019   Procedure: BIOPSY;  Surgeon: Janel Medford, MD;  Location: [facility] ENDOSCOPY;  Service: Endoscopy;;   BIOPSY  03/04/2022   Procedure: BIOPSY;  Surgeon: Normie Becton., MD;  Location: [facility] ENDOSCOPY;  Service: Gastroenterology;;   CARDIAC CATHETERIZATION  2006   CARDIOVERSION N/A 02/20/2018   Procedure: CARDIOVERSION;  Surgeon: Devorah Fonder, MD;  Location: [facility] ORS;  Service: Cardiovascular;  Laterality: N/A;   CARDIOVERSION N/A 03/27/2018   Procedure: CARDIOVERSION;  Surgeon: Devorah Fonder, MD;  Location: [facility] ORS;  Service: Cardiovascular;  Laterality: N/A;   CATARACT EXTRACTION W/ INTRAOCULAR LENS  IMPLANT, BILATERAL  01/2006-02-2006   CHOLECYSTECTOMY N/A 11/11/2012   Procedure: LAPAROSCOPIC CHOLECYSTECTOMY WITH INTRAOPERATIVE CHOLANGIOGRAM;  Surgeon: Kari Otto. Eli Grizzle, MD;  Location: [facility] OR;  Service: General;  Laterality: N/A;   COLONOSCOPY WITH PROPOFOL  N/A 02/28/2019   Procedure: COLONOSCOPY WITH PROPOFOL ;  Surgeon: Janel Medford, MD;  Location: [facility] ENDOSCOPY;  Service: Endoscopy;  Laterality: N/A;   ENTEROSCOPY N/A 03/30/2020   Procedure: ENTEROSCOPY;  Surgeon: Annis Kinder, DO;  Location: [facility] ENDOSCOPY;  Service: Gastroenterology;  Laterality: N/A;   ENTEROSCOPY N/A 02/10/2021   Procedure: ENTEROSCOPY;  Surgeon: Alvis Jourdain, MD;  Location: [facility] ENDOSCOPY;   Service: Endoscopy;  Laterality: N/A;   ENTEROSCOPY N/A 02/15/2022   Procedure: ENTEROSCOPY;  Surgeon: Daina Drum, MD;  Location: [facility] ENDOSCOPY;  Service: Gastroenterology;  Laterality: N/A;   ENTEROSCOPY N/A 11/06/2022   Procedure: ENTEROSCOPY;  Surgeon: Brice Campi Albino Alu., MD;  Location: [facility] ENDOSCOPY;  Service: Gastroenterology;  Laterality: N/A;   EP IMPLANTABLE DEVICE N/A 02/21/2016   Procedure: ICD Generator Changeout;  Surgeon: Verona Goodwill, MD;  Location: [facility] INVASIVE CV LAB;  Service: Cardiovascular;  Laterality: N/A;   ESOPHAGOGASTRODUODENOSCOPY N/A 03/04/2022   Procedure: ESOPHAGOGASTRODUODENOSCOPY (EGD);  Surgeon: Normie Becton., MD;  Location: [facility]OSCOPY;  Service: Gastroenterology;  Laterality: N/A;   ESOPHAGOGASTRODUODENOSCOPY (EGD) WITH PROPOFOL  N/A 02/28/2019   Procedure: ESOPHAGOGASTRODUODENOSCOPY (EGD) WITH PROPOFOL ;  Surgeon: Janel Medford, MD;  Location: [facility] ENDOSCOPY;  Service: Endoscopy;  Laterality: N/A;   EUS N/A  03/04/2022   Procedure: UPPER ENDOSCOPIC ULTRASOUND (EUS) RADIAL;  Surgeon: Normie Becton., MD;  Location: [facility] ENDOSCOPY;  Service: Gastroenterology;  Laterality: N/A;   FINE NEEDLE ASPIRATION N/A 03/04/2022   Procedure: FINE NEEDLE ASPIRATION (FNA) LINEAR;  Surgeon: Normie Becton., MD;  Location: [facility] ENDOSCOPY;  Service: Gastroenterology;  Laterality: N/A;   GIVENS CAPSULE STUDY N/A 03/15/2019   Procedure: GIVENS CAPSULE STUDY;  Surgeon: Lindle Rhea, MD;  Location: [facility] ENDOSCOPY;  Service: Gastroenterology;  Laterality: N/A;   HOT HEMOSTASIS N/A 03/30/2020   Procedure: HOT HEMOSTASIS (ARGON PLASMA COAGULATION/BICAP);  Surgeon: Annis Kinder, DO;  Location: [facility] ENDOSCOPY;  Service: Gastroenterology;  Laterality: N/A;   HOT HEMOSTASIS N/A 02/10/2021   Procedure: HOT HEMOSTASIS (ARGON PLASMA COAGULATION/BICAP);  Surgeon: Alvis Jourdain, MD;  Location: [facility] ENDOSCOPY;  Service: Endoscopy;  Laterality: N/A;   HOT HEMOSTASIS N/A  02/15/2022   Procedure: HOT HEMOSTASIS (ARGON PLASMA COAGULATION/BICAP);  Surgeon: Daina Drum, MD;  Location: [facility] ENDOSCOPY;  Service: Gastroenterology;  Laterality: N/A;   HOT HEMOSTASIS N/A 11/06/2022   Procedure: HOT HEMOSTASIS (ARGON PLASMA COAGULATION/BICAP);  Surgeon: Normie Becton., MD;  Location: [facility] ENDOSCOPY;  Service: Gastroenterology;  Laterality: N/A;   INSERT / REPLACE / REMOVE PACEMAKER  05-01-11   02-28-05-/05-04-10-ICD-MEDTRONIC MAXIMAL DR   IR ANGIOGRAM SELECTIVE EACH ADDITIONAL VESSEL  02/18/2022   IR ANGIOGRAM SELECTIVE EACH ADDITIONAL VESSEL  02/18/2022   IR ANGIOGRAM VISCERAL SELECTIVE  02/16/2022   IR ANGIOGRAM VISCERAL SELECTIVE  02/16/2022   IR EMBO ART  VEN HEMORR LYMPH EXTRAV  INC GUIDE ROADMAPPING  02/16/2022   IR THORACENTESIS ASP PLEURAL SPACE W/IMG GUIDE  02/21/2022   IR THORACENTESIS ASP PLEURAL SPACE W/IMG GUIDE  02/22/2022   IR US  GUIDE VASC ACCESS RIGHT  02/16/2022   JOINT REPLACEMENT     LAPAROSCOPIC CHOLECYSTECTOMY  11/11/2012   LAPAROSCOPIC LYSIS OF ADHESIONS N/A 11/11/2012   Procedure: LAPAROSCOPIC LYSIS OF ADHESIONS;  Surgeon: Kari Otto. Eli Grizzle, MD;  Location: [facility] OR;  Service: General;  Laterality: N/A;   NEPHRECTOMY Right 06/2001    S/P RENAL CELL CANCER   PERIPHERAL VASCULAR INTERVENTION Bilateral 02/12/2021   Procedure: PERIPHERAL VASCULAR INTERVENTION;  Surgeon: Adine Hoof, MD;  Location: [facility] INVASIVE CV LAB;  Service: Cardiovascular;  Laterality: Bilateral;  Iliac artery stents   PRESSURE SENSOR/CARDIOMEMS N/A 02/03/2019   Procedure: PRESSURE SENSOR/CARDIOMEMS;  Surgeon: Darlis Eisenmenger, MD;  Location: [facility] INVASIVE CV LAB;  Service: Cardiovascular;  Laterality: N/A;   RIGHT HEART CATH N/A 11/09/2018   Procedure: RIGHT HEART CATH;  Surgeon: Darlis Eisenmenger, MD;  Location: [facility] INVASIVE CV LAB;  Service: Cardiovascular;  Laterality: N/A;   RIGHT HEART CATH N/A 03/08/2019   Procedure: RIGHT HEART CATH;  Surgeon: Darlis Eisenmenger, MD;   Location: [facility] INVASIVE CV LAB;  Service: Cardiovascular;  Laterality: N/A;   SUBMUCOSAL TATTOO INJECTION  02/28/2019   Procedure: SUBMUCOSAL TATTOO INJECTION;  Surgeon: Janel Medford, MD;  Location: [facility] ENDOSCOPY;  Service: Endoscopy;;   SUBMUCOSAL TATTOO INJECTION  11/06/2022   Procedure: SUBMUCOSAL TATTOO INJECTION;  Surgeon: Normie Becton., MD;  Location: [facility] ENDOSCOPY;  Service: Gastroenterology;;   TOTAL HIP ARTHROPLASTY Right 05/03/2011   Procedure: TOTAL HIP ARTHROPLASTY ANTERIOR APPROACH;  Surgeon: Arnie Lao;  Location: [facility] ORS;  Service: Orthopedics;  Laterality: Right;  Removal of Cannulated Screws Right Hip, Right Direct Anterior Hip Replacement   TOTAL HIP ARTHROPLASTY Left 10/09/2018   Procedure: TOTAL HIP ARTHROPLASTY ANTERIOR APPROACH;  Surgeon: Adonica Hoose, MD;  Location: [facility] OR;  Service: Orthopedics;  Laterality: Left;    Physical Exam   Triage Vital Signs: ED Triage Vitals  Encounter Vitals Group     BP --      Systolic BP Percentile --      Diastolic BP Percentile --      Pulse Rate 09/03/23 1532 (!) 103     Resp 09/03/23 1532 (!) 22     Temp --      Temp src --      SpO2 09/03/23 1532 (!) 3 %     Weight --      Height --      Head Circumference --      Peak Flow --      Pain Score 09/03/23 1527 10     Pain Loc --      Pain Education --      Exclude from Growth Chart --     Most recent vital signs: Vitals:   09/03/23 1630 09/03/23 1700  BP: (!) 133/51 (!) 103/33  Pulse: (!) 104 71  Resp: 20 (!) 21  Temp:    SpO2: 100% 100%    General: Awake, ill-appearing.  CV:  Good peripheral perfusion.  Irregular rhythm, tachycardia heart rate 110.  Normal distal pulses Resp:  Normal effort.  Clear to auscultation bilaterally Abd:  No distention.  Soft with suprapubic and left lower quadrant tenderness Other:  2+ edema bilateral lower extremities, symmetric.  Both legs from upper shin to ankle have purplish discoloration consistent with venous  stasis disease.  Left leg has a large open wound with honey crusting and weeping over the top.  Both legs are tender and warm to the touch as well.  No crepitus.   ED Results / Procedures / Treatments   Labs (all labs ordered are listed, but only abnormal results are displayed) Labs Reviewed  LACTIC ACID, PLASMA - Abnormal; Notable for the following components:      Result Value   Lactic Acid, Venous 3.4 (*)    All other components within normal limits  COMPREHENSIVE METABOLIC PANEL WITH GFR - Abnormal; Notable for the following components:   Sodium 134 (*)    Chloride 91 (*)    BUN 50 (*)    Creatinine, Ser 1.60 (*)    GFR, Estimated 31 (*)    All other components within normal limits  CBC WITH DIFFERENTIAL/PLATELET - Abnormal; Notable for the following components:   WBC 11.2 (*)    Hemoglobin 9.7 (*)    HCT 33.7 (*)    MCV 79.9 (*)    MCH 23.0 (*)    MCHC 28.8 (*)    RDW 19.4 (*)    Neutro Abs 9.2 (*)    All other components within normal limits  PROTIME-INR - Abnormal; Notable for the following components:   Prothrombin Time 15.7 (*)    All other components within normal limits  URINALYSIS, W/ REFLEX TO CULTURE (INFECTION SUSPECTED) - Abnormal; Notable for the following components:   Color, Urine STRAW (*)    APPearance CLEAR (*)    Bacteria, UA RARE (*)    All other components within normal limits  TROPONIN I (HIGH SENSITIVITY) - Abnormal; Notable for the following components:   Troponin I (High Sensitivity) 35 (*)    All other components within normal limits  RESP PANEL BY RT-PCR (RSV, FLU A&B, COVID)  [id number]  CULTURE, BLOOD (ROUTINE X 2)  CULTURE, BLOOD (ROUTINE X 2)  LIPASE, BLOOD  LACTIC ACID, PLASMA     EKG Interpreted by me Atrial fibrillation, rate 111.  Left axis, left bundle branch block.  Mildly prolonged QTc.  Mild ST elevation in V2 V3 and ST depression in V5 V6, not significantly changed from previous EKG on May 27, 2023.   RADIOLOGY Chest  x-ray interpreted by me, no acute findings.  Radiology report reviewed  CT abdomen pelvis and lumbar spine reveals no acute findings.   PROCEDURES:  .Critical Care  Performed by: Jacquie Maudlin, MD Authorized by: Jacquie Maudlin, MD   Critical care provider statement:    Critical care time (minutes):  35   Critical care time was exclusive of:  Separately billable procedures and treating other patients   Critical care was necessary to treat or prevent imminent or life-threatening deterioration of the following conditions:  Sepsis and respiratory failure   Critical care was time spent personally by me on the following activities:  Development of treatment plan with patient or surrogate, discussions with consultants, evaluation of patient's response to treatment, examination of patient, obtaining history from patient or surrogate, ordering and performing treatments and interventions, ordering and review of laboratory studies, ordering and review of radiographic studies, pulse oximetry, re-evaluation of patient's condition and review of old charts   Care discussed with: admitting provider      MEDICATIONS ORDERED IN ED: Medications  metroNIDAZOLE (FLAGYL) IVPB 500 mg (500 mg Intravenous New Bag/Given 09/03/23 1635)  vancomycin  (VANCOCIN ) IVPB 1000 mg/200 mL premix (has no administration in time range)  sodium chloride  0.9 % bolus 1,000 mL (0 mLs Intravenous Stopped 09/03/23 1708)  ceFEPIme  (MAXIPIME ) 2 g in sodium chloride  0.9 % 100 mL IVPB (0 g Intravenous Stopped 09/03/23 1633)  acetaminophen  (OFIRMEV ) IV 1,000 mg (0 mg Intravenous Stopped 09/03/23 1633)     IMPRESSION / MDM / ASSESSMENT AND PLAN / ED COURSE  I reviewed the triage vital signs and the nursing notes.  DDx: Cellulitis/impetigo, sepsis, UTI, diverticulitis, intra-abdominal abscess, dehydration, electrolyte derangement, NSTEMI  Patient's presentation is most consistent with acute presentation with potential threat to  life or bodily function.  Patient presents with lower abdominal pain and leg pain, found to be febrile tachycardic tachypneic.  Code sepsis initiated with empiric antibiotics and fluid bolus after initial assessment.  EKG is similar in appearance to previous EKG in May 27, 2023 for her atrial fibrillation rhythm morphology.  ST abnormalities are more pronounced, which may be due to acute acidosis, or increased demand ischemia from tachycardia.  Discussed with STEMI cardiologist Dr. Parks Bollman who agrees it is not diagnostic for STEMI in this clinical scenario.  Lab panel reveals lactate of 3.4, otherwise no significant initial findings.  Will consult hospitalist for further management of cellulitis and sepsis.       FINAL CLINICAL IMPRESSION(S) / ED DIAGNOSES   Final diagnoses:  Cellulitis of left lower extremity  Sepsis without acute organ dysfunction, due to unspecified organism [facility])  Chronic atrial fibrillation (HCC)     Rx / DC Orders   ED Discharge Orders     None        Note:  This document was prepared using Dragon voice recognition software and may include unintentional dictation errors.   Jacquie Maudlin, MD 09/03/23 [phone]

## 2023-09-03 NOTE — Progress Notes (Signed)
 Asked to review ECG with concern for possible ischemic changes. ECG reveals AF with IVCD without diagnostic ST elevation. Patient here with probable sepsis without CP. Patient with known nonischemic cardiomyopathy. Would defer code STEMI at this time.

## 2023-09-03 NOTE — H&P (Signed)
 [facility] History and Physical   Patient: [name] ZOX:[id number]   PCP: Claire Crick, MD DOB: Nov 28, 1933   DOA: 09/03/2023   DOS: 09/03/2023   DOS: the patient was seen and examined on 09/03/2023  Patient coming from: The patient is coming from SNF  Chief Complaint: Shaking/chills and bilateral lower extremity pain  HPI: [name] is a 89 y.o. female with Past medical history of chronic A-fib, sinus bradycardia s/p AICD, HFpEF, moderate AS and MR, severe TR, severe PAH, PAD, carotid stenosis, COPD, chronic respiratory failure on supplemental O2 inhalation, GI bleed due to AVM, renal cell carcinoma s/p  right renal nephrectomy, pancreatic cancer, squamous carcinoma of foot, bilateral lower extremity edema and chronic home nonhealing wounds, getting wound care as an outpatient but no improvement.  Presented at [facility] ED with complaining of shaking and chills for more than half an hour and complaining of bilateral lower extremity pain and back pain. Denied any other complaints.  ED Course: VS Tmax 102.4, HR 104, RR 22, BP 112/72 O2 sat 95% on 3 L CMP: Sodium 134, chloride 91, BUN 50, creatinine 1.6 EGFR 31, rest within normal range Trop 35 most likely demand ischemia, no chest pain Lactic acid 3.4/31.2 improved after IV fluid WBC 11.2, hemoglobin 9.7 Negative COVID, influenza and RSV  TR[facility] was consulted for admission and further management as below  Review of Systems: as mentioned in the history of present illness.  All other systems reviewed and are negative.  Past Medical History:  Diagnosis Date   (HFpEF) heart failure with preserved ejection fraction (HCC)    EF=60-60%   Actinic keratosis 01/17/2015   R forearm   Adjustment disorder with anxiety    Adverse effect of other narcotics, sequela    Intolerance to all narcotics   Anti-Duffy antibodies present    Aortic valve stenosis    Arthritis    "some in my hands" (11/11/2012)   Atrial fibrillation,  permanent (HCC)    Eliquis    Atypical mole 03/25/2018   L forearm - severe   Automatic implantable cardioverter-defibrillator in situ    Avascular necrosis of hip (HCC) 05/03/2011   Carotid artery stenosis 09/2007   a. 09/2007: 60-79% bilateral (stable); b. 10/2008: 40-59% R 60-79%    Cellulitis of left lower extremity 07/13/2020   CKD (chronic kidney disease), stage III (HCC)    COPD (chronic obstructive pulmonary disease) (HCC)    Coronary artery disease    non-obstructive by 2006 cath   COVID-19 virus infection 02/12/2022   Displaced fracture of left femoral neck (HCC) 10/09/2018   GI bleed 03/28/2020   AVM   High cholesterol    Hypertension 05/20/2011   Hypertr obst cardiomyop    Hypotension, unspecified    cardiac cath 2006..nonobstructive CAD 30-40s lesions.Aaron AasETT 1/09 nondiagnostic due to poor HR response..Right Renal Cancer 2003   Iron  deficiency anemia    Long term (current) use of anticoagulants    On home oxygen  therapy    3L [place]   Osteoarthritis of right hip    PAD (peripheral artery disease) (HCC)    Pancreatic cancer (HCC)    sandostatin    PONV (postoperative nausea and vomiting)    Presence of permanent cardiac pacemaker    Pulmonary HTN (HCC)    RECTAL BLEEDING 10/13/2009   Qualifier: Diagnosis of  By: Daphane Dynes NP, Paula     Red blood cell antibody positive with compatible PRBC difficult to obtain    Anti FYA (Duffy a) antibody. Must  be transfused with PRB which are Duffy Antigen Negative and Crossmatch Compatible   Renal cell carcinoma (HCC)    s/p nephrectomy   Squamous cell carcinoma of skin 01/17/2015   R lat wrist   Squamous cell carcinoma of skin 01/29/2018   R post upper leg - superficially invasive   Squamous cell carcinoma of skin 03/25/2018   L lat foot   Squamous cell carcinoma of skin 11/09/2020   left lat foot - EDC 01/01/21, recurrent 01/31/21 - MOHs 02/22/21   Squamous cell carcinoma of skin 12/19/2021   Right Posterior Medial Thigh, EDC    Squamous cell carcinoma of skin 12/19/2021   SCC IS, L lat heel, EDC 02/04/2022   Squamous cell carcinoma of skin 01/21/2022   L forearm, EDC 02/04/2022   Squamous cell carcinoma of skin 01/21/2022   R lower leg below knee, EDC 02/04/2022   Squamous cell carcinoma of skin 01/21/2022   SCCIS, R post heel, EDC 02/04/2022   Squamous cell carcinoma of skin 07/01/2022   left lower abdomen, in situ, EDC   Squamous cell carcinoma of skin 07/01/2022   left medial chest, EDC   Urge incontinence    Venous stasis of both lower extremities    Past Surgical History:  Procedure Laterality Date   ABDOMINAL AORTOGRAM W/LOWER EXTREMITY N/A 02/12/2021   Procedure: ABDOMINAL AORTOGRAM W/LOWER EXTREMITY;  Surgeon: Adine Hoof, MD;  Location: [facility] INVASIVE CV LAB;  Service: Cardiovascular;  Laterality: N/A;   ABDOMINAL HYSTERECTOMY  1975   for benign causes   APPENDECTOMY     BI-VENTRICULAR PACEMAKER UPGRADE  05/04/2010   BIOPSY  02/28/2019   Procedure: BIOPSY;  Surgeon: Janel Medford, MD;  Location: [facility] ENDOSCOPY;  Service: Endoscopy;;   BIOPSY  03/04/2022   Procedure: BIOPSY;  Surgeon: Normie Becton., MD;  Location: [facility] ENDOSCOPY;  Service: Gastroenterology;;   CARDIAC CATHETERIZATION  2006   CARDIOVERSION N/A 02/20/2018   Procedure: CARDIOVERSION;  Surgeon: Devorah Fonder, MD;  Location: [facility] ORS;  Service: Cardiovascular;  Laterality: N/A;   CARDIOVERSION N/A 03/27/2018   Procedure: CARDIOVERSION;  Surgeon: Devorah Fonder, MD;  Location: [facility] ORS;  Service: Cardiovascular;  Laterality: N/A;   CATARACT EXTRACTION W/ INTRAOCULAR LENS  IMPLANT, BILATERAL  01/2006-02-2006   CHOLECYSTECTOMY N/A 11/11/2012   Procedure: LAPAROSCOPIC CHOLECYSTECTOMY WITH INTRAOPERATIVE CHOLANGIOGRAM;  Surgeon: Kari Otto. Eli Grizzle, MD;  Location: [facility] OR;  Service: General;  Laterality: N/A;   COLONOSCOPY WITH PROPOFOL  N/A 02/28/2019   Procedure: COLONOSCOPY WITH PROPOFOL ;  Surgeon: Janel Medford,  MD;  Location: [facility] ENDOSCOPY;  Service: Endoscopy;  Laterality: N/A;   ENTEROSCOPY N/A 03/30/2020   Procedure: ENTEROSCOPY;  Surgeon: Annis Kinder, DO;  Location: [facility] ENDOSCOPY;  Service: Gastroenterology;  Laterality: N/A;   ENTEROSCOPY N/A 02/10/2021   Procedure: ENTEROSCOPY;  Surgeon: Alvis Jourdain, MD;  Location: [facility] ENDOSCOPY;  Service: Endoscopy;  Laterality: N/A;   ENTEROSCOPY N/A 02/15/2022   Procedure: ENTEROSCOPY;  Surgeon: Daina Drum, MD;  Location: [facility] ENDOSCOPY;  Service: Gastroenterology;  Laterality: N/A;   ENTEROSCOPY N/A 11/06/2022   Procedure: ENTEROSCOPY;  Surgeon: Brice Campi Albino Alu., MD;  Location: [facility] ENDOSCOPY;  Service: Gastroenterology;  Laterality: N/A;   EP IMPLANTABLE DEVICE N/A 02/21/2016   Procedure: ICD Generator Changeout;  Surgeon: Verona Goodwill, MD;  Location: [facility] INVASIVE CV LAB;  Service: Cardiovascular;  Laterality: N/A;   ESOPHAGOGASTRODUODENOSCOPY N/A 03/04/2022   Procedure: ESOPHAGOGASTRODUODENOSCOPY (EGD);  Surgeon: Normie Becton., MD;  Location: [facility] ENDOSCOPY;  Service: Gastroenterology;  Laterality: N/A;   ESOPHAGOGASTRODUODENOSCOPY (EGD) WITH PROPOFOL  N/A 02/28/2019   Procedure: ESOPHAGOGASTRODUODENOSCOPY (EGD) WITH PROPOFOL ;  Surgeon: Janel Medford, MD;  Location: [facility] ENDOSCOPY;  Service: Endoscopy;  Laterality: N/A;   EUS N/A 03/04/2022   Procedure: UPPER ENDOSCOPIC ULTRASOUND (EUS) RADIAL;  Surgeon: Normie Becton., MD;  Location: [facility] ENDOSCOPY;  Service: Gastroenterology;  Laterality: N/A;   FINE NEEDLE ASPIRATION N/A 03/04/2022   Procedure: FINE NEEDLE ASPIRATION (FNA) LINEAR;  Surgeon: Normie Becton., MD;  Location: [facility] ENDOSCOPY;  Service: Gastroenterology;  Laterality: N/A;   GIVENS CAPSULE STUDY N/A 03/15/2019   Procedure: GIVENS CAPSULE STUDY;  Surgeon: Lindle Rhea, MD;  Location: [facility] ENDOSCOPY;  Service: Gastroenterology;  Laterality: N/A;   HOT HEMOSTASIS N/A 03/30/2020   Procedure: HOT HEMOSTASIS (ARGON  PLASMA COAGULATION/BICAP);  Surgeon: Annis Kinder, DO;  Location: [facility] ENDOSCOPY;  Service: Gastroenterology;  Laterality: N/A;   HOT HEMOSTASIS N/A 02/10/2021   Procedure: HOT HEMOSTASIS (ARGON PLASMA COAGULATION/BICAP);  Surgeon: Alvis Jourdain, MD;  Location: [facility] ENDOSCOPY;  Service: Endoscopy;  Laterality: N/A;   HOT HEMOSTASIS N/A 02/15/2022   Procedure: HOT HEMOSTASIS (ARGON PLASMA COAGULATION/BICAP);  Surgeon: Daina Drum, MD;  Location: [facility] ENDOSCOPY;  Service: Gastroenterology;  Laterality: N/A;   HOT HEMOSTASIS N/A 11/06/2022   Procedure: HOT HEMOSTASIS (ARGON PLASMA COAGULATION/BICAP);  Surgeon: Normie Becton., MD;  Location: [facility] ENDOSCOPY;  Service: Gastroenterology;  Laterality: N/A;   INSERT / REPLACE / REMOVE PACEMAKER  05-01-11   02-28-05-/05-04-10-ICD-MEDTRONIC MAXIMAL DR   IR ANGIOGRAM SELECTIVE EACH ADDITIONAL VESSEL  02/18/2022   IR ANGIOGRAM SELECTIVE EACH ADDITIONAL VESSEL  02/18/2022   IR ANGIOGRAM VISCERAL SELECTIVE  02/16/2022   IR ANGIOGRAM VISCERAL SELECTIVE  02/16/2022   IR EMBO ART  VEN HEMORR LYMPH EXTRAV  INC GUIDE ROADMAPPING  02/16/2022   IR THORACENTESIS ASP PLEURAL SPACE W/IMG GUIDE  02/21/2022   IR THORACENTESIS ASP PLEURAL SPACE W/IMG GUIDE  02/22/2022   IR US  GUIDE VASC ACCESS RIGHT  02/16/2022   JOINT REPLACEMENT     LAPAROSCOPIC CHOLECYSTECTOMY  11/11/2012   LAPAROSCOPIC LYSIS OF ADHESIONS N/A 11/11/2012   Procedure: LAPAROSCOPIC LYSIS OF ADHESIONS;  Surgeon: Kari Otto. Eli Grizzle, MD;  Location: [facility] OR;  Service: General;  Laterality: N/A;   NEPHRECTOMY Right 06/2001    S/P RENAL CELL CANCER   PERIPHERAL VASCULAR INTERVENTION Bilateral 02/12/2021   Procedure: PERIPHERAL VASCULAR INTERVENTION;  Surgeon: Adine Hoof, MD;  Location: [facility] INVASIVE CV LAB;  Service: Cardiovascular;  Laterality: Bilateral;  Iliac artery stents   PRESSURE SENSOR/CARDIOMEMS N/A 02/03/2019   Procedure: PRESSURE SENSOR/CARDIOMEMS;  Surgeon: Darlis Eisenmenger, MD;   Location: [facility] INVASIVE CV LAB;  Service: Cardiovascular;  Laterality: N/A;   RIGHT HEART CATH N/A 11/09/2018   Procedure: RIGHT HEART CATH;  Surgeon: Darlis Eisenmenger, MD;  Location: [facility] INVASIVE CV LAB;  Service: Cardiovascular;  Laterality: N/A;   RIGHT HEART CATH N/A 03/08/2019   Procedure: RIGHT HEART CATH;  Surgeon: Darlis Eisenmenger, MD;  Location: [facility] INVASIVE CV LAB;  Service: Cardiovascular;  Laterality: N/A;   SUBMUCOSAL TATTOO INJECTION  02/28/2019   Procedure: SUBMUCOSAL TATTOO INJECTION;  Surgeon: Janel Medford, MD;  Location: [facility] ENDOSCOPY;  Service: Endoscopy;;   SUBMUCOSAL TATTOO INJECTION  11/06/2022   Procedure: SUBMUCOSAL TATTOO INJECTION;  Surgeon: Normie Becton., MD;  Location: [facility] ENDOSCOPY;  Service: Gastroenterology;;   TOTAL HIP ARTHROPLASTY Right 05/03/2011   Procedure: TOTAL HIP ARTHROPLASTY ANTERIOR APPROACH;  Surgeon: Arnie Lao;  Location: [facility] ORS;  Service:  Orthopedics;  Laterality: Right;  Removal of Cannulated Screws Right Hip, Right Direct Anterior Hip Replacement   TOTAL HIP ARTHROPLASTY Left 10/09/2018   Procedure: TOTAL HIP ARTHROPLASTY ANTERIOR APPROACH;  Surgeon: Adonica Hoose, MD;  Location: [facility] OR;  Service: Orthopedics;  Laterality: Left;   Social History:  reports that she quit smoking about 22 years ago. Her smoking use included cigarettes. She started smoking about 62 years ago. She has a 20 pack-year smoking history. She has never used smokeless tobacco. She reports that she does not drink alcohol and does not use drugs.  Allergies  Allergen Reactions   Codeine Nausea And Vomiting and Other (See Comments)    Hallucinations, too- CANNOT HAVE; thinks she may have received Narcan  to reverse the effect   Dilaudid  [Hydromorphone ] Other (See Comments)    Excessive Somnolence- Required Narcan  X2   Tape Other (See Comments)    CAN TOLERATE ONLY EASY-RELEASE, PAPER TAPE, AS THE SKIN IS VERY THIN AND WILL BRUISE AND TEAR VERY EASILY   Amoxil   [Amoxicillin ] Nausea Only   Asa [Aspirin ] Other (See Comments)    Told to avoid due to reduced kidney function, has 1 remaining kidney   Ms Contin  [Morphine ] Nausea And Vomiting and Other (See Comments)    Hallucinations, also   Neurontin  [Gabapentin ] Other (See Comments)    "Out of it"   Nsaids Other (See Comments)    Told to avoid due to reduced kidney function, has 1 remaining kidney     Family history reviewed and not pertinent Family History  Problem Relation Age of Onset   Heart failure Mother    Early death Father        car accident   Breast cancer Maternal Aunt 23   Breast cancer Cousin    Breast cancer Other    Colon cancer Neg Hx    Esophageal cancer Neg Hx    Liver disease Neg Hx    Pancreatic cancer Neg Hx    Rectal cancer Neg Hx      Prior to Admission medications   Medication Sig Start Date End Date Taking? Authorizing Provider  Ascorbic Acid  (VITAMIN C  PO) Take 1 tablet by mouth daily.   Yes [provider]  Cholecalciferol  (VITAMIN D -3 PO) Take 2 capsules by mouth daily.   Yes [provider]  Cyanocobalamin  (B-12 PO) Take 1 tablet by mouth daily.   Yes [provider]  ferrous gluconate  (FERGON) 324 MG tablet Take 1 tablet (324 mg total) by mouth daily with breakfast. 11/15/22  Yes Rai, Ripudeep K, MD  fluticasone  (FLONASE ) 50 MCG/ACT nasal spray Place 2 sprays into both nostrils daily as needed for allergies or rhinitis. 07/04/22  Yes Claire Crick, MD  acetaminophen  (TYLENOL ) 500 MG tablet Take 1,000 mg by mouth every 8 (eight) hours.    [provider]  cephALEXin  (KEFLEX ) 500 MG capsule Take 1 capsule (500 mg total) by mouth 3 (three) times daily. Patient not taking: Reported on 09/03/2023 08/11/23   Sonja [place], MD  CRANBERRY PO Take 1 tablet by mouth daily.    [provider]  metolazone  (ZAROXOLYN ) 2.5 MG tablet Take 1 tablet (2.5 mg total) by mouth 3 (three) times a week. Every Tues., Thurs and Sat. with 40  meq of potassium 07/25/23   Lauralee Poll, MD  Multiple Vitamins-Minerals (HAIR SKIN NAILS PO) Take 1 capsule by mouth daily.    [provider]  pantoprazole  (PROTONIX ) 40 MG tablet Take 1 tablet (40 mg total)  by mouth daily. 11/09/22   Rai, Hurman Maiden, MD  Potassium Chloride  ER 20 MEQ TBCR Take 1 tablet (20 mEq total) by mouth daily. 07/24/23 10/22/23  Lauralee Poll, MD  potassium chloride  SA (KLOR-CON  M) 20 MEQ tablet Take 20 mEq by mouth daily. 07/10/23   [provider]  rosuvastatin  (CRESTOR ) 10 MG tablet Take 1 tablet (10 mg total) by mouth daily. 06/20/23   Brenna Cam, MD  torsemide  (DEMADEX ) 20 MG tablet Take 5 tablets (100 mg total) by mouth 2 (two) times daily. 07/10/23   Darlis Eisenmenger, MD    Physical Exam: Vitals:   09/03/23 1630 09/03/23 1700 09/03/23 1746 09/03/23 1800  BP: (!) 133/51 (!) 103/33  (!) 105/39  Pulse: (!) 104 71  74  Resp: 20 (!) 21  20  Temp:   (!) 101.1 F (38.4 C)   TempSrc:   Rectal   SpO2: 100% 100%  100%    General: alert and oriented to time, place, and person. Appear in mild distress, affect appropriate Eyes: PERRLA, Conjunctiva normal ENT: Oral Mucosa Clear, moist  Neck: no JVD, no Abnormal Mass Or lumps Cardiovascular: S1 and S2 Present, positive Murmur,  Respiratory: good respiratory effort, Bilateral Air entry equal and Decreased, no signs of accessory muscle use, Clear to Auscultation, no Crackles, no wheezes Abdomen: Bowel Sound present, Soft and no tenderness Skin: Bilateral lower extremity erythema, chronic wounds and tenderness Extremities: 2+ pedal edema, no calf tenderness Neurologic: without any new focal findings Gait not checked due to patient safety concerns  Data Reviewed: I have personally reviewed and interpreted labs, imaging as discussed below.  CBC: Recent Labs  Lab 09/03/23 1600  WBC 11.2*  NEUTROABS 9.2*  HGB 9.7*  HCT 33.7*  MCV 79.9*  PLT 273   Basic Metabolic Panel: Recent Labs  Lab  09/03/23 1600  NA 134*  K 3.7  CL 91*  CO2 29  GLUCOSE 74  BUN 50*  CREATININE 1.60*  CALCIUM  9.1   GFR: CrCl cannot be calculated (Unknown ideal weight.). Liver Function Tests: Recent Labs  Lab 09/03/23 1600  AST 21  ALT 10  ALKPHOS 61  BILITOT 1.1  PROT 7.8  ALBUMIN  3.6   Recent Labs  Lab 09/03/23 1600  LIPASE 43   No results for input(s): "AMMONIA" in the last 168 hours. Coagulation Profile: Recent Labs  Lab 09/03/23 1600  INR 1.2   Cardiac Enzymes: No results for input(s): "CKTOTAL", "CKMB", "CKMBINDEX", "TROPONINI" in the last 168 hours. BNP (last 3 results) No results for input(s): "PROBNP" in the last 8760 hours. HbA1C: No results for input(s): "HGBA1C" in the last 72 hours. CBG: No results for input(s): "GLUCAP" in the last 168 hours. Lipid Profile: No results for input(s): "CHOL", "HDL", "LDLCALC", "TRIG", "CHOLHDL", "LDLDIRECT" in the last 72 hours. Thyroid  Function Tests: No results for input(s): "TSH", "T4TOTAL", "FREET4", "T3FREE", "THYROIDAB" in the last 72 hours. Anemia Panel: No results for input(s): "VITAMINB12", "FOLATE", "FERRITIN", "TIBC", "IRON ", "RETICCTPCT" in the last 72 hours. Urine analysis:    Component Value Date/Time   COLORURINE STRAW (A) 09/03/2023 1534   APPEARANCEUR CLEAR (A) 09/03/2023 1534   APPEARANCEUR Cloudy (A) 01/27/2018 1330   LABSPEC 1.009 09/03/2023 1534   PHURINE 5.0 09/03/2023 1534   GLUCOSEU NEGATIVE 09/03/2023 1534   HGBUR NEGATIVE 09/03/2023 1534   HGBUR large 11/16/2009 1202   BILIRUBINUR NEGATIVE 09/03/2023 1534   BILIRUBINUR Negative 11/08/2021 1237   BILIRUBINUR Negative 01/27/2018 1330   KETONESUR NEGATIVE 09/03/2023 1534  PROTEINUR NEGATIVE 09/03/2023 1534   UROBILINOGEN 0.2 11/08/2021 1237   UROBILINOGEN 1.0 08/16/2014 1754   NITRITE NEGATIVE 09/03/2023 1534   LEUKOCYTESUR NEGATIVE 09/03/2023 1534    Radiological Exams on Admission: DG Chest Port 1 View Result Date: 09/03/2023 CLINICAL  DATA:  Question of sepsis to evaluate for abnormality. EXAM: PORTABLE CHEST 1 VIEW COMPARISON:  06/24/2023 FINDINGS: Cardiac pacemaker. Cardiac enlargement with pulmonary vascular congestion. Patchy perihilar infiltrates with interstitial changes in the lung bases, likely edema. Small bilateral pleural effusions are decreased since prior study. No focal consolidation. Mediastinal contours appear intact. Calcification of the aorta. Degenerative changes in the spine and shoulders. Sclerosis in the left humeral head likely represents benign bone island or possibly enchondroma. No change. IMPRESSION: Cardiac enlargement with pulmonary vascular congestion and interstitial edema. Small bilateral pleural effusions are decreased since prior study. Electronically Signed   By: Boyce Byes M.D.   On: 09/03/2023 16:13   CT ABDOMEN PELVIS WO CONTRAST Result Date: 09/03/2023 CLINICAL DATA:  Acute nonlocalized abdominal pain. Bilateral leg swelling and wounds. EXAM: CT ABDOMEN AND PELVIS WITHOUT CONTRAST TECHNIQUE: Multidetector CT imaging of the abdomen and pelvis was performed following the standard protocol without IV contrast. RADIATION DOSE REDUCTION: This exam was performed according to the departmental dose-optimization program which includes automated exposure control, adjustment of the mA and/or kV according to patient size and/or use of iterative reconstruction technique. COMPARISON:  11/04/2022 FINDINGS: Lower chest: Motion artifact limits evaluation. Mild interstitial changes in the lungs may represent fibrosis or edema. Scattered pulmonary nodules. Largest is on the right, measuring 5 mm diameter. Series 4, image 4. Cardiac enlargement. Small bilateral pleural effusions with basilar atelectasis. Hepatobiliary: Liver parenchymal pattern is somewhat heterogeneous. Suggestion of focal nodules including a hypodense nodule in the dome measuring 1.6 cm and a vague nodular opacity in the inferior right lobe likely  measuring around 2 cm diameter. A left lobe nodule is demonstrated in the previous study that is not well seen today without contrast. Nodular changes are better characterized on previous contrast-enhanced studies. Lesions remain indeterminate. Pancreas: Mass in the head and body of the pancreas measuring 4.2 cm diameter. This is better seen on previous contrast-enhanced study and appears grossly unchanged. No pancreatic ductal dilatation. Spleen: Normal in size without focal abnormality. Adrenals/Urinary Tract: No adrenal gland nodules. Surgical absence of the right kidney. Left kidney appears normal. No hydronephrosis, hydroureter, or stones identified. Bladder is normal. Stomach/Bowel: Stomach, small bowel, and colon are not abnormally distended. No wall thickening or inflammatory changes. Stool throughout the colon. Appendix is not identified. Vascular/Lymphatic: Calcification of the aorta. Aortoiliac stent graft. No aneurysm. Retroperitoneal lymphadenopathy with periaortic nodes measuring up to about 10 mm diameter. This is similar to prior study. Reproductive: No pelvic mass or collections identified. Other: No free air or free fluid in the abdomen. Abdominal wall musculature appears intact. Musculoskeletal: Bilateral total hip replacements. Compression fracture of L2, unchanged since prior study. Degenerative changes in the spine. IMPRESSION: 1. 4.2 cm diameter mass in the pancreatic head/body appears grossly unchanged since prior study. 2. Indeterminate hypodense liver nodules appear unchanged. 3. Small bilateral pleural effusions with basilar atelectasis and interstitial changes. Cardiac enlargement. 4. Multiple pulmonary nodules. Most significant: Right solid pulmonary nodule measuring 5 mm. In the setting of probable cancer, these lesions are indeterminate. Follow-up as per protocol. 5. Aortic atherosclerosis. 6. Nonspecific retroperitoneal lymphadenopathy with lymph nodes measuring up to 10 mm diameter.  7. Surgical absence of the right kidney. Electronically Signed   By:  Boyce Byes M.D.   On: 09/03/2023 16:12   CT L-SPINE NO CHARGE Result Date: 09/03/2023 CLINICAL DATA:  Abdominal pain, bilateral leg swelling and wounds EXAM: CT LUMBAR SPINE WITHOUT CONTRAST TECHNIQUE: Multidetector CT imaging of the lumbar spine was performed without intravenous contrast administration. Multiplanar CT image reconstructions were also generated. RADIATION DOSE REDUCTION: This exam was performed according to the departmental dose-optimization program which includes automated exposure control, adjustment of the mA and/or kV according to patient size and/or use of iterative reconstruction technique. COMPARISON:  11/04/2022 FINDINGS: Segmentation: 5 lumbar type vertebrae. Alignment: Stable kyphosis centered at a chronic L2 compression fracture. Otherwise alignment is anatomic. Vertebrae: Bones are severely osteopenic. Chronic L2 compression deformity with greater than 75% loss of height and 8 mm of retropulsion, unchanged since previous exam. No acute displaced fractures. No destructive bony lesions. Paraspinal and other soft tissues: The paraspinal soft tissues are unremarkable. Atherosclerosis of the aorta, with bilateral iliac stents noted. Trace bilateral pleural effusions. Please refer to separately reported CT abdomen and pelvis exam for findings in that region. Disc levels: There is mild-to-moderate central canal stenosis at L1-2 and L2-3 related to the prior L2 compression fracture, stable. At L3-4 there is circumferential disc bulge with bilateral facet and ligamentum flavum hypertrophy, resulting in severe central canal stenosis and symmetrical bilateral neural foraminal encroachment. At L4-5 there is partial bony fusion across the disc space again noted. Mild circumferential disc osteophyte complex and bilateral facet hypertrophy contribute to mild central canal stenosis. At L5-S1 there is circumferential disc bulge  with bilateral facet and ligamentum flavum hypertrophy resulting in moderate central canal stenosis. Reconstructed images demonstrate no additional findings. IMPRESSION: 1. No acute lumbar spine fracture. 2. Stable chronic L2 compression deformity with greater than 75% loss of height. 3. Severe osteopenia. 4. Multilevel lumbar degenerative changes as above, with central canal stenosis most pronounced at L3-4 and L5-S1. 5. Please refer to separately reported CT abdomen and pelvis exam for findings in that region. Electronically Signed   By: Bobbye Burrow M.D.   On: 09/03/2023 16:08   EKG: Independently reviewed. atrial fibrillation, rate 111. Echocardiogram: 05/28/23 LVEF 65 to 70%, mild LV hypertrophy, right ventricular systolic function is moderately reduced, RV moderately enlarged, severe pulmonary hypertension, RA moderately dilated.  Moderate mitral stenosis, severe TR, moderate aortic stenosis.   I reviewed all nursing notes, pharmacy notes, vitals, pertinent old records.  Assessment/Plan Active Problems:   Sepsis due to cellulitis (HCC)  Sepsis secondary to bilateral lower extremity cellulitis Fever, tachycardia and tachypnea, leukocytosis, elevated lactic acid with cellulitis of lower extremities Started Vanco and cefepime , pharmacy consulted for dosing and trough monitoring Follow-up blood cultures Wound care RN consulted   Chronic A-fib, bradycardia status post AICD, carotid stenosis, PAD, HFpEF, right ventricular failure, severe pulm hypertension, Moderate AS and MS, severe TR - Patient is not on DOAC possible due to GI bleed secondary to AVM - Blood pressure is soft due to sepsis - Patient was on torsemide  100 mg p.o. twice daily - Decreased torsemide  40 mg p.o. daily with holding parameter - Resumed metolazone  2.5 mg p.o. 3 times a week home dose - Monitor BP and titrate medications accordingly - Monitor renal functions and urine output  # COPD, chronic respiratory failure on  supplemental O2 inhalation  No exacerbation noticed on exam Patient is not on any inhalers Monitor  # Pancreatic mass, CT scan shows liver nodules and lung nodules, retroperitoneal lymphadenopathy.  Most likely metastasis History of renal cell carcinoma,  right renal nephrectomy History of squamous cell carcinoma foot Overall prognosis poor, patient would like to continue be treated medically. DNR/DNI Does not want to give up and palliative care is following, does not want hospice at this time   Nutrition: Regular diet DVT Prophylaxis: Subcutaneous Lovenox   Advance goals of care discussion: DNR/DNI-limited  Consults: None  Family Communication: family was present at bedside, at the time of interview.  Opportunity was given to ask question and all questions were answered satisfactorily.  Disposition: Admitted as inpatient, medical telemetry unit. Likely to be discharged SNF, in 3-5 days when stable.  I have discussed plan of care as described above with RN and patient/family.  Severity of Illness: The appropriate patient status for this patient is INPATIENT. Inpatient status is judged to be reasonable and necessary in order to provide the required intensity of service to ensure the patient's safety. The patient's presenting symptoms, physical exam findings, and initial radiographic and laboratory data in the context of their chronic comorbidities is felt to place them at high risk for further clinical deterioration. Furthermore, it is not anticipated that the patient will be medically stable for discharge from the hospital within 2 midnights of admission.   * I certify that at the point of admission it is my clinical judgment that the patient will require inpatient hospital care spanning beyond 2 midnights from the point of admission due to high intensity of service, high risk for further deterioration and high frequency of surveillance required.*   Author: Althia Atlas, MD [facility] 09/03/2023 6:18 PM   To reach On-call, see care teams to locate the attending and reach out to them via [email or web address]. If 7PM-7AM, please contact night-coverage If you still have difficulty reaching the attending provider, please page the [facility] (Director on Call) for [facility] on amion for assistance.

## 2023-09-03 NOTE — Progress Notes (Signed)
 CODE SEPSIS - PHARMACY COMMUNICATION  **Broad Spectrum Antibiotics should be administered within 1 hour of Sepsis diagnosis**  Time Code Sepsis Called/Page Received: 1534  Antibiotics Ordered: cefepime , vancomycin , and metronidazole  Time of 1st antibiotic administration: 1603  Additional action taken by pharmacy: None  If necessary, Name of Provider/Nurse Contacted: None   Ananias Balls ,PharmD Clinical Pharmacist  09/03/2023  3:45 PM

## 2023-09-03 NOTE — Sepsis Progress Note (Signed)
 Sepsis protocol monitored by eLink ?

## 2023-09-03 NOTE — Consult Note (Addendum)
 WOC Nurse Consult Note: patient known to [facility] team from previous admissions with venous insufficiency and resultant venous stasis ulcers; patient was seen by ID 08/26/2023 with photo documentation of wounds  Reason for Consult: L lower cellulitis  Wound type: full thickness r/t venous insufficiency  Pressure Injury POA: NA  Measurement: see nursing flowsheet  Wound bed:R posterior leg noted to have ulceration with yellow brown dry necrotic tissue; L anterior brown black necrotic tissue, L lateral aspect foot yellow necrotic tissue; R dorsal foot hyperkeratotic tissue;  Drainage (amount, consistency, odor) weeping per MD note  Periwound: edema, erythema and hyperkeratotic skin  Dressing procedure/placement/frequency:  Cleanse R leg (intact skin and ulcerations) with Vashe wound cleanser Timm Foot [phone]), do not rinse and allow to air dry.  Apply Xeroform gauze Timm Foot [phone]) to anterior and posterior R leg, secure with ABD pad and Kerlix roll gauze beginning right above toes and ending right below knee.  Apply Ace bandage wrapped in same fashion as Kerlix for light compression.  Cleanse L lower leg (intact skin and ulcerations) with Vashe wound cleanser, apply Medihoney to open necrotic wound beds daily, cover with dry gauze and ABD pads. Secure with Kerlix roll gauze beginning right above toes and ending right below knee.  Apply Ace bandage wrapped in same fashion as Kerlix for light compression.  Patient has been referred to [facility] after 08/26/2023 visit with ID.  Patient should keep that appointment for ongoing management of leg edema and leg wounds.   POC discussed with primary nurse. WOc team will not follow. Re-consult if further needs arise.   Thank you,     Ronni Colace MSN, RN-BC, [employer] [phone]

## 2023-09-03 NOTE — Progress Notes (Addendum)
 Pharmacy Antibiotic Note  [name] is a 89 y.o. female w/ PMH of CHF, CKD, COPD, HTNadmitted on 09/03/2023 with cellulitis.  Pharmacy has been consulted for vancomycin  and cefepime  dosing.  Plan:  1) start cefepime  2 grams IV every 24 hours  2) start vancomycin  1000 mg IV Q 48 hrs. Goal AUC 400-550. Expected AUC: 530.7 SCr used: 1.9 mg/dL (appears to be approximate baseline)     Temp (24hrs), Avg:100.6 F (38.1 C), Min:98.7 F (37.1 C), Max:102.4 F (39.1 C)  Recent Labs  Lab 09/03/23 1600  WBC 11.2*  CREATININE 1.60*  LATICACIDVEN 3.4*    CrCl cannot be calculated (Unknown ideal weight.).  weight  61.6 kg 07/24/23  Allergies  Allergen Reactions   Codeine Nausea And Vomiting and Other (See Comments)    Hallucinations, too- CANNOT HAVE; thinks she may have received Narcan  to reverse the effect   Dilaudid  [Hydromorphone ] Other (See Comments)    Excessive Somnolence- Required Narcan  X2   Tape Other (See Comments)    CAN TOLERATE ONLY EASY-RELEASE, PAPER TAPE, AS THE SKIN IS VERY THIN AND WILL BRUISE AND TEAR VERY EASILY   Amoxil  [Amoxicillin ] Nausea Only   Bjorn Bullocks ] Other (See Comments)    Told to avoid due to reduced kidney function, has 1 remaining kidney   Ms Contin  [Morphine ] Nausea And Vomiting and Other (See Comments)    Hallucinations, also   Neurontin  [Gabapentin ] Other (See Comments)    "Out of it"   Nsaids Other (See Comments)    Told to avoid due to reduced kidney function, has 1 remaining kidney    Antimicrobials this admission: 04/30 vancomycin  >>  04/30 cefepime  >>   Microbiology results: 04/30 BCx: pending  Thank you for allowing pharmacy to be a part of this patient's care.  Adalberto Acton 09/03/2023 5:31 PM

## 2023-09-03 NOTE — Discharge Summary (Addendum)
 Disregard this note

## 2023-09-04 ENCOUNTER — Inpatient Hospital Stay

## 2023-09-04 ENCOUNTER — Inpatient Hospital Stay: Admitting: Nurse Practitioner

## 2023-09-04 DIAGNOSIS — R7881 Bacteremia: Secondary | ICD-10-CM

## 2023-09-04 DIAGNOSIS — L0390 Cellulitis, unspecified: Secondary | ICD-10-CM | POA: Diagnosis not present

## 2023-09-04 DIAGNOSIS — L97919 Non-pressure chronic ulcer of unspecified part of right lower leg with unspecified severity: Secondary | ICD-10-CM | POA: Diagnosis not present

## 2023-09-04 DIAGNOSIS — N189 Chronic kidney disease, unspecified: Secondary | ICD-10-CM

## 2023-09-04 DIAGNOSIS — D649 Anemia, unspecified: Secondary | ICD-10-CM

## 2023-09-04 DIAGNOSIS — B9689 Other specified bacterial agents as the cause of diseases classified elsewhere: Secondary | ICD-10-CM | POA: Diagnosis not present

## 2023-09-04 DIAGNOSIS — A419 Sepsis, unspecified organism: Secondary | ICD-10-CM | POA: Diagnosis not present

## 2023-09-04 DIAGNOSIS — I5032 Chronic diastolic (congestive) heart failure: Secondary | ICD-10-CM

## 2023-09-04 DIAGNOSIS — Z66 Do not resuscitate: Secondary | ICD-10-CM

## 2023-09-04 DIAGNOSIS — Q600 Renal agenesis, unilateral: Secondary | ICD-10-CM

## 2023-09-04 DIAGNOSIS — R079 Chest pain, unspecified: Secondary | ICD-10-CM

## 2023-09-04 DIAGNOSIS — L97929 Non-pressure chronic ulcer of unspecified part of left lower leg with unspecified severity: Secondary | ICD-10-CM | POA: Diagnosis not present

## 2023-09-04 LAB — BLOOD CULTURE ID PANEL (REFLEXED) - BCID2

## 2023-09-04 LAB — D-DIMER, QUANTITATIVE: D-Dimer, Quant: 0.3 ug{FEU}/mL (ref 0.00–0.50)

## 2023-09-04 LAB — CBC
HCT: 27.5 % — ABNORMAL LOW (ref 36.0–46.0)
Hemoglobin: 8.2 g/dL — ABNORMAL LOW (ref 12.0–15.0)
MCH: 23.4 pg — ABNORMAL LOW (ref 26.0–34.0)
MCHC: 29.8 g/dL — ABNORMAL LOW (ref 30.0–36.0)
MCV: 78.3 fL — ABNORMAL LOW (ref 80.0–100.0)
Platelets: 212 10*3/uL (ref 150–400)
RBC: 3.51 MIL/uL — ABNORMAL LOW (ref 3.87–5.11)
RDW: 19.5 % — ABNORMAL HIGH (ref 11.5–15.5)
WBC: 10.1 10*3/uL (ref 4.0–10.5)
nRBC: 0 % (ref 0.0–0.2)

## 2023-09-04 LAB — BASIC METABOLIC PANEL WITH GFR
Anion gap: 6 (ref 5–15)
BUN: 51 mg/dL — ABNORMAL HIGH (ref 8–23)
CO2: 28 mmol/L (ref 22–32)
Calcium: 7.9 mg/dL — ABNORMAL LOW (ref 8.9–10.3)
Chloride: 97 mmol/L — ABNORMAL LOW (ref 98–111)
Creatinine, Ser: 1.89 mg/dL — ABNORMAL HIGH (ref 0.44–1.00)
GFR, Estimated: 25 mL/min — ABNORMAL LOW (ref >60)
Glucose, Bld: 99 mg/dL (ref 70–99)
Potassium: 4.2 mmol/L (ref 3.5–5.1)
Sodium: 131 mmol/L — ABNORMAL LOW (ref 135–145)

## 2023-09-04 LAB — PHOSPHORUS: Phosphorus: 4.9 mg/dL — ABNORMAL HIGH (ref 2.5–4.6)

## 2023-09-04 LAB — TROPONIN I (HIGH SENSITIVITY)
Troponin I (High Sensitivity): 1072 ng/L (ref <18)
Troponin I (High Sensitivity): 901 ng/L (ref <18)

## 2023-09-04 LAB — MAGNESIUM: Magnesium: 2 mg/dL (ref 1.7–2.4)

## 2023-09-04 MED ORDER — SODIUM CHLORIDE 0.9 % IV SOLN: INTRAVENOUS | Status: AC

## 2023-09-04 MED ORDER — SODIUM CHLORIDE 0.9 % IV SOLN: INTRAVENOUS | Status: DC

## 2023-09-04 MED ORDER — HYDROCERIN EX CREA
TOPICAL_CREAM | Freq: Every day | CUTANEOUS | Status: DC
  Filled 2023-09-04: qty 113

## 2023-09-04 MED ORDER — ASPIRIN 81 MG PO CHEW
81.0000 mg | CHEWABLE_TABLET | ORAL | Status: AC
  Administered 2023-09-04: 81 mg via ORAL
  Filled 2023-09-04: qty 1

## 2023-09-04 NOTE — Progress Notes (Signed)
 PT Cancellation Note  Patient Details Name: [name] MRN: [id number] DOB: 02-Mar-1934   Cancelled Treatment:    Reason Eval/Treat Not Completed: Patient not medically ready PT orders received, chart reviewed. Hold PT evaluation at this time per MD as pt with elevated troponins. Will f/u as able.  [name], PT, DPT 09/04/23, 9:50 AM   [name] 09/04/2023, 9:50 AM

## 2023-09-04 NOTE — Plan of Care (Signed)
  Problem: Education: Goal: Knowledge of General Education information will improve Description: Including pain rating scale, medication(s)/side effects and non-pharmacologic comfort measures Outcome: Progressing   Problem: Health Behavior/Discharge Planning: Goal: Ability to manage health-related needs will improve Outcome: Progressing   Problem: Clinical Measurements: Goal: Ability to maintain clinical measurements within normal limits will improve Outcome: Progressing Goal: Respiratory complications will improve Outcome: Progressing Goal: Cardiovascular complication will be avoided Outcome: Progressing   Problem: Activity: Goal: Risk for activity intolerance will decrease Outcome: Progressing   Problem: Nutrition: Goal: Adequate nutrition will be maintained Outcome: Progressing   Problem: Coping: Goal: Level of anxiety will decrease Outcome: Progressing   Problem: Elimination: Goal: Will not experience complications related to bowel motility Outcome: Progressing Goal: Will not experience complications related to urinary retention Outcome: Progressing   Problem: Pain Managment: Goal: General experience of comfort will improve and/or be controlled Outcome: Progressing

## 2023-09-04 NOTE — Progress Notes (Addendum)
 PROGRESS NOTE    [name]  UJW:[id number] DOB: 04-03-1934 DOA: 09/03/2023 PCP: Claire Crick, MD     Brief Narrative:   From admission h and p  [name] is a 89 y.o. female with Past medical history of chronic A-fib, sinus bradycardia s/p AICD, HFpEF, moderate AS and MR, severe TR, severe PAH, PAD, carotid stenosis, COPD, chronic respiratory failure on supplemental O2 inhalation, GI bleed due to AVM, renal cell carcinoma s/p  right renal nephrectomy, pancreatic cancer, squamous carcinoma of foot, bilateral lower extremity edema and chronic home nonhealing wounds, getting wound care as an outpatient but no improvement.  Presented at [facility] ED with complaining of shaking and chills for more than half an hour and complaining of bilateral lower extremity pain and back pain. Denied any other complaints.  Assessment & Plan:   Active Problems:   Atrial fibrillation, permanent (HCC)   COPD (chronic obstructive pulmonary disease) (HCC)   ICD (implantable cardioverter-defibrillator) in place   Hypertension   PAD (peripheral artery disease) (HCC)   History of nephrectomy secondary to RCC   (HFpEF) heart failure with preserved ejection fraction (HCC)   Sepsis due to cellulitis (HCC)  # GNR bacteremia From lower extremity wounds. Started on vanc/cefepime  on arrival - ID to see, advises stop vanc for now, continue cefepime  - await speciation - TTE ordered (has ICD, may need TEE)  # Lower extremity cellulitis # Venous stasis dermatitis # SCC RLE Chronic problem. Failed outpt cefadroxil . Hospitalized earlier this year for cellulitis. No DVT seen at that time - wound care consult - cefepime  as above - ABIs ordered but patient refused  # Sepsis, severe By fever, lactic acidosis. Lactate has normalized - gentle fluids  # Chest pain # NSTEMI New today, says is pleuritic. No new dyspnea. Doesn't radiate. Dimer wnl but trops markedly elevated - cardiology ([facility]) to  see, advises 81 mg of asa to start, plan is medical mgmt  # HFpEF # Pulmonary hypertension, severe # Moderate AS, MS # TR, severe # Bradycardia s/p AICD Appears compensated - hold torsemide  and metolazone  while fluid resuscitating  # COPD # Chronic hypoxic respiratory failure Stable on home 2 liters  # A-fib Not anticoagulated 2/2 recurrent GI bleed  # Pancreatic neuroendocrine tumor Followed by dr. Maryalice Smaller outpt  # Recurrent GI bleed 2/2 AVMs. Hgb here stable - monitor hgb  # HTN BPs soft - holding hom meds  # CKD 3/4 # History RCC s/p nephrectomy Gfr in upper 20s is at baseline - monitor  # End-of-life care Prognosis is very poor. Patient reports she is followed by palliative as outpt, declines offer to meet with them here. Continues to want to treat the treatable.   # Debility - PT/OT consults   DVT prophylaxis: lovenox  Code Status: dnr/dni, confirmed with patient 5/1 at bedside Family Communication: daughter cathy updated telephonically 5/1  Level of care: Telemetry Medical Status is: Inpatient Remains inpatient appropriate because: severity of illness    Consultants:  Wound, ID  Procedures: None thus far  Antimicrobials:  Vanc/cefepime  > cefepime    Subjective: Reports ongoing lower extremity pain, new pleuritic substernal/epigastric pleuritic pain  Objective: Vitals:   09/03/23 1920 09/03/23 2020 09/04/23 0009 09/04/23 0521  BP:  98/61 (!) 112/59 96/67  Pulse: 74 67 80 60  Resp: 20 18 18 18   Temp:  (!) 97.3 F (36.3 C) 98.2 F (36.8 C) 98.7 F (37.1 C)  TempSrc:  Oral  Oral  SpO2: 100% 92% 97% 98%  Weight:  64.1 kg    Height:  5' 6.5" (1.689 m)      Intake/Output Summary (Last 24 hours) at 09/04/2023 0827 Last data filed at 09/04/2023 0300 Gross per 24 hour  Intake 1663.9 ml  Output --  Net 1663.9 ml   Filed Weights   09/03/23 2020  Weight: 64.1 kg    Examination:  General exam: Appears calm and comfortable  Respiratory  system: rales at bases otherwise clear Cardiovascular system: S1 & S2 heard, rr, harsh systolic murmur Gastrointestinal system: Abdomen is nondistended, soft and nontender.   Central nervous system: Alert and oriented. No focal neurological deficits. Extremities:  Skin: see above Psychiatry: Judgement and insight appear normal. Mood & affect appropriate.     Data Reviewed: I have personally reviewed following labs and imaging studies  CBC: Recent Labs  Lab 09/03/23 1600 09/04/23 0502  WBC 11.2* 10.1  NEUTROABS 9.2*  --   HGB 9.7* 8.2*  HCT 33.7* 27.5*  MCV 79.9* 78.3*  PLT 273 212   Basic Metabolic Panel: Recent Labs  Lab 09/03/23 1600 09/04/23 0502  NA 134* 131*  K 3.7 4.2  CL 91* 97*  CO2 29 28  GLUCOSE 74 99  BUN 50* 51*  CREATININE 1.60* 1.89*  CALCIUM  9.1 7.9*  MG  --  2.0  PHOS  --  4.9*   GFR: Estimated Creatinine Clearance: 20 mL/min (A) (by C-G formula based on SCr of 1.89 mg/dL (H)). Liver Function Tests: Recent Labs  Lab 09/03/23 1600  AST 21  ALT 10  ALKPHOS 61  BILITOT 1.1  PROT 7.8  ALBUMIN  3.6   Recent Labs  Lab 09/03/23 1600  LIPASE 43   No results for input(s): "AMMONIA" in the last 168 hours. Coagulation Profile: Recent Labs  Lab 09/03/23 1600  INR 1.2   Cardiac Enzymes: No results for input(s): "CKTOTAL", "CKMB", "CKMBINDEX", "TROPONINI" in the last 168 hours. BNP (last 3 results) No results for input(s): "PROBNP" in the last 8760 hours. HbA1C: No results for input(s): "HGBA1C" in the last 72 hours. CBG: No results for input(s): "GLUCAP" in the last 168 hours. Lipid Profile: No results for input(s): "CHOL", "HDL", "LDLCALC", "TRIG", "CHOLHDL", "LDLDIRECT" in the last 72 hours. Thyroid  Function Tests: No results for input(s): "TSH", "T4TOTAL", "FREET4", "T3FREE", "THYROIDAB" in the last 72 hours. Anemia Panel: No results for input(s): "VITAMINB12", "FOLATE", "FERRITIN", "TIBC", "IRON ", "RETICCTPCT" in the last 72  hours. Urine analysis:    Component Value Date/Time   COLORURINE STRAW (A) 09/03/2023 1534   APPEARANCEUR CLEAR (A) 09/03/2023 1534   APPEARANCEUR Cloudy (A) 01/27/2018 1330   LABSPEC 1.009 09/03/2023 1534   PHURINE 5.0 09/03/2023 1534   GLUCOSEU NEGATIVE 09/03/2023 1534   HGBUR NEGATIVE 09/03/2023 1534   HGBUR large 11/16/2009 1202   BILIRUBINUR NEGATIVE 09/03/2023 1534   BILIRUBINUR Negative 11/08/2021 1237   BILIRUBINUR Negative 01/27/2018 1330   KETONESUR NEGATIVE 09/03/2023 1534   PROTEINUR NEGATIVE 09/03/2023 1534   UROBILINOGEN 0.2 11/08/2021 1237   UROBILINOGEN 1.0 08/16/2014 1754   NITRITE NEGATIVE 09/03/2023 1534   LEUKOCYTESUR NEGATIVE 09/03/2023 1534   Sepsis Labs: @LABRCNTIP (procalcitonin:4,lacticidven:4)  ) Recent Results (from the past 240 hours)  Resp panel by RT-PCR (RSV, Flu A&B, Covid) Anterior Nasal Swab     Status: None   Collection Time: 09/03/23  3:34 PM   Specimen: Anterior Nasal Swab  Result Value Ref Range Status   SARS Coronavirus 2 by RT PCR NEGATIVE NEGATIVE Final    Comment: (NOTE) SARS-CoV-2 target  nucleic acids are NOT DETECTED.  The SARS-CoV-2 RNA is generally detectable in upper respiratory specimens during the acute phase of infection. The lowest concentration of SARS-CoV-2 viral copies this assay can detect is 138 copies/mL. A negative result does not preclude SARS-Cov-2 infection and should not be used as the sole basis for treatment or other patient management decisions. A negative result may occur with  improper specimen collection/handling, submission of specimen other than nasopharyngeal swab, presence of viral mutation(s) within the areas targeted by this assay, and inadequate number of viral copies(<138 copies/mL). A negative result must be combined with clinical observations, patient history, and epidemiological information. The expected result is Negative.  Fact Sheet for Patients:   BloggerCourse.com  Fact Sheet for Healthcare Providers:  SeriousBroker.it  This test is no t yet approved or cleared by the [place]  FDA and  has been authorized for detection and/or diagnosis of SARS-CoV-2 by FDA under an Emergency Use Authorization (EUA). This EUA will remain  in effect (meaning this test can be used) for the duration of the COVID-19 declaration under Section 564(b)(1) of the Act, 21 U.S.C.section 360bbb-3(b)(1), unless the authorization is terminated  or revoked sooner.       Influenza A by PCR NEGATIVE NEGATIVE Final   Influenza B by PCR NEGATIVE NEGATIVE Final    Comment: (NOTE) The Xpert Xpress SARS-CoV-2/FLU/RSV plus assay is intended as an aid in the diagnosis of influenza from Nasopharyngeal swab specimens and should not be used as a sole basis for treatment. Nasal washings and aspirates are unacceptable for Xpert Xpress SARS-CoV-2/FLU/RSV testing.  Fact Sheet for Patients: BloggerCourse.com  Fact Sheet for Healthcare Providers: SeriousBroker.it  This test is not yet approved or cleared by the [place]  FDA and has been authorized for detection and/or diagnosis of SARS-CoV-2 by FDA under an Emergency Use Authorization (EUA). This EUA will remain in effect (meaning this test can be used) for the duration of the COVID-19 declaration under Section 564(b)(1) of the Act, 21 U.S.C. section 360bbb-3(b)(1), unless the authorization is terminated or revoked.     Resp Syncytial Virus by PCR NEGATIVE NEGATIVE Final    Comment: (NOTE) Fact Sheet for Patients: BloggerCourse.com  Fact Sheet for Healthcare Providers: SeriousBroker.it  This test is not yet approved or cleared by the [place]  FDA and has been authorized for detection and/or diagnosis of SARS-CoV-2 by FDA under an Emergency Use  Authorization (EUA). This EUA will remain in effect (meaning this test can be used) for the duration of the COVID-19 declaration under Section 564(b)(1) of the Act, 21 U.S.C. section 360bbb-3(b)(1), unless the authorization is terminated or revoked.  Performed at [facility], [address] [place], [place] [address]   Blood Culture (routine x 2)     Status: None (Preliminary result)   Collection Time: 09/03/23  3:38 PM   Specimen: BLOOD  Result Value Ref Range Status   Specimen Description BLOOD RIGHT ANTECUBITAL  Final   Special Requests   Final    BOTTLES DRAWN AEROBIC AND ANAEROBIC Blood Culture adequate volume   Culture  Setup Time   Final    GRAM NEGATIVE RODS IN BOTH AEROBIC AND ANAEROBIC BOTTLES Organism ID to follow CRITICAL RESULT CALLED TO, READ BACK BY AND VERIFIED WITH: MADISON HUNT PHARMD [id number] 09/04/23 HNM    Culture   Final    NO GROWTH < 24 HOURS Performed at [facility], [address], [place], [place] [address]    Report Status PENDING  Incomplete  Blood Culture ID Panel (Reflexed)     Status: Abnormal   Collection Time: 09/03/23  3:38 PM  Result Value Ref Range Status   Enterococcus faecalis NOT DETECTED NOT DETECTED Final   Enterococcus Faecium NOT DETECTED NOT DETECTED Final   Listeria monocytogenes NOT DETECTED NOT DETECTED Final   Staphylococcus species NOT DETECTED NOT DETECTED Final   Staphylococcus aureus (BCID) NOT DETECTED NOT DETECTED Final   Staphylococcus epidermidis NOT DETECTED NOT DETECTED Final   Staphylococcus lugdunensis NOT DETECTED NOT DETECTED Final   Streptococcus species NOT DETECTED NOT DETECTED Final   Streptococcus agalactiae NOT DETECTED NOT DETECTED Final   Streptococcus pneumoniae NOT DETECTED NOT DETECTED Final   Streptococcus pyogenes NOT DETECTED NOT DETECTED Final   A.calcoaceticus-baumannii NOT DETECTED NOT DETECTED Final   Bacteroides fragilis NOT DETECTED NOT DETECTED Final   Enterobacterales  DETECTED (A) NOT DETECTED Final    Comment: Enterobacterales represent a large order of gram negative bacteria, not a single organism. Refer to culture for further identification. CRITICAL RESULT CALLED TO, READ BACK BY AND VERIFIED WITH: MADISON HUNT PHARMD 0725 09/04/23 HNM    Enterobacter cloacae complex NOT DETECTED NOT DETECTED Final   Escherichia coli NOT DETECTED NOT DETECTED Final   Klebsiella aerogenes NOT DETECTED NOT DETECTED Final   Klebsiella oxytoca NOT DETECTED NOT DETECTED Final   Klebsiella pneumoniae NOT DETECTED NOT DETECTED Final   Proteus species NOT DETECTED NOT DETECTED Final   Salmonella species NOT DETECTED NOT DETECTED Final   Serratia marcescens NOT DETECTED NOT DETECTED Final   Haemophilus influenzae NOT DETECTED NOT DETECTED Final   Neisseria meningitidis NOT DETECTED NOT DETECTED Final   Pseudomonas aeruginosa NOT DETECTED NOT DETECTED Final   Stenotrophomonas maltophilia NOT DETECTED NOT DETECTED Final   Candida albicans NOT DETECTED NOT DETECTED Final   Candida auris NOT DETECTED NOT DETECTED Final   Candida glabrata NOT DETECTED NOT DETECTED Final   Candida krusei NOT DETECTED NOT DETECTED Final   Candida parapsilosis NOT DETECTED NOT DETECTED Final   Candida tropicalis NOT DETECTED NOT DETECTED Final   Cryptococcus neoformans/gattii NOT DETECTED NOT DETECTED Final   CTX-M ESBL NOT DETECTED NOT DETECTED Final   Carbapenem resistance IMP NOT DETECTED NOT DETECTED Final   Carbapenem resistance KPC NOT DETECTED NOT DETECTED Final   Carbapenem resistance NDM NOT DETECTED NOT DETECTED Final   Carbapenem resist OXA 48 LIKE NOT DETECTED NOT DETECTED Final   Carbapenem resistance VIM NOT DETECTED NOT DETECTED Final    Comment: Performed at [facility], [address] [place], [place] [address]  Blood Culture (routine x 2)     Status: None (Preliminary result)   Collection Time: 09/03/23  4:00 PM   Specimen: BLOOD  Result Value Ref Range Status    Specimen Description BLOOD BLOOD LEFT FOREARM  Final   Special Requests   Final    BOTTLES DRAWN AEROBIC AND ANAEROBIC Blood Culture adequate volume   Culture   Final    GRAM NEGATIVE RODS IN BOTH AEROBIC AND ANAEROBIC BOTTLES CRITICAL VALUE NOTED.  VALUE IS CONSISTENT WITH PREVIOUSLY REPORTED AND CALLED VALUE. Performed at [facility], [address], [place], [place] [address]    Report Status PENDING  Incomplete         Radiology Studies: DG Chest [facility] 1 View Result Date: 09/03/2023 CLINICAL DATA:  Question of sepsis to evaluate for abnormality. EXAM: PORTABLE CHEST 1 VIEW COMPARISON:  06/24/2023 FINDINGS: Cardiac pacemaker. Cardiac enlargement with pulmonary vascular congestion. Patchy perihilar  infiltrates with interstitial changes in the lung bases, likely edema. Small bilateral pleural effusions are decreased since prior study. No focal consolidation. Mediastinal contours appear intact. Calcification of the aorta. Degenerative changes in the spine and shoulders. Sclerosis in the left humeral head likely represents benign bone island or possibly enchondroma. No change. IMPRESSION: Cardiac enlargement with pulmonary vascular congestion and interstitial edema. Small bilateral pleural effusions are decreased since prior study. Electronically Signed   By: Boyce Byes M.D.   On: 09/03/2023 16:13   CT ABDOMEN PELVIS WO CONTRAST Result Date: 09/03/2023 CLINICAL DATA:  Acute nonlocalized abdominal pain. Bilateral leg swelling and wounds. EXAM: CT ABDOMEN AND PELVIS WITHOUT CONTRAST TECHNIQUE: Multidetector CT imaging of the abdomen and pelvis was performed following the standard protocol without IV contrast. RADIATION DOSE REDUCTION: This exam was performed according to the departmental dose-optimization program which includes automated exposure control, adjustment of the mA and/or kV according to patient size and/or use of iterative reconstruction technique. COMPARISON:   11/04/2022 FINDINGS: Lower chest: Motion artifact limits evaluation. Mild interstitial changes in the lungs may represent fibrosis or edema. Scattered pulmonary nodules. Largest is on the right, measuring 5 mm diameter. Series 4, image 4. Cardiac enlargement. Small bilateral pleural effusions with basilar atelectasis. Hepatobiliary: Liver parenchymal pattern is somewhat heterogeneous. Suggestion of focal nodules including a hypodense nodule in the dome measuring 1.6 cm and a vague nodular opacity in the inferior right lobe likely measuring around 2 cm diameter. A left lobe nodule is demonstrated in the previous study that is not well seen today without contrast. Nodular changes are better characterized on previous contrast-enhanced studies. Lesions remain indeterminate. Pancreas: Mass in the head and body of the pancreas measuring 4.2 cm diameter. This is better seen on previous contrast-enhanced study and appears grossly unchanged. No pancreatic ductal dilatation. Spleen: Normal in size without focal abnormality. Adrenals/Urinary Tract: No adrenal gland nodules. Surgical absence of the right kidney. Left kidney appears normal. No hydronephrosis, hydroureter, or stones identified. Bladder is normal. Stomach/Bowel: Stomach, small bowel, and colon are not abnormally distended. No wall thickening or inflammatory changes. Stool throughout the colon. Appendix is not identified. Vascular/Lymphatic: Calcification of the aorta. Aortoiliac stent graft. No aneurysm. Retroperitoneal lymphadenopathy with periaortic nodes measuring up to about 10 mm diameter. This is similar to prior study. Reproductive: No pelvic mass or collections identified. Other: No free air or free fluid in the abdomen. Abdominal wall musculature appears intact. Musculoskeletal: Bilateral total hip replacements. Compression fracture of L2, unchanged since prior study. Degenerative changes in the spine. IMPRESSION: 1. 4.2 cm diameter mass in the pancreatic  head/body appears grossly unchanged since prior study. 2. Indeterminate hypodense liver nodules appear unchanged. 3. Small bilateral pleural effusions with basilar atelectasis and interstitial changes. Cardiac enlargement. 4. Multiple pulmonary nodules. Most significant: Right solid pulmonary nodule measuring 5 mm. In the setting of probable cancer, these lesions are indeterminate. Follow-up as per protocol. 5. Aortic atherosclerosis. 6. Nonspecific retroperitoneal lymphadenopathy with lymph nodes measuring up to 10 mm diameter. 7. Surgical absence of the right kidney. Electronically Signed   By: Boyce Byes M.D.   On: 09/03/2023 16:12   CT L-SPINE NO CHARGE Result Date: 09/03/2023 CLINICAL DATA:  Abdominal pain, bilateral leg swelling and wounds EXAM: CT LUMBAR SPINE WITHOUT CONTRAST TECHNIQUE: Multidetector CT imaging of the lumbar spine was performed without intravenous contrast administration. Multiplanar CT image reconstructions were also generated. RADIATION DOSE REDUCTION: This exam was performed according to the departmental dose-optimization program which includes automated exposure control,  adjustment of the mA and/or kV according to patient size and/or use of iterative reconstruction technique. COMPARISON:  11/04/2022 FINDINGS: Segmentation: 5 lumbar type vertebrae. Alignment: Stable kyphosis centered at a chronic L2 compression fracture. Otherwise alignment is anatomic. Vertebrae: Bones are severely osteopenic. Chronic L2 compression deformity with greater than 75% loss of height and 8 mm of retropulsion, unchanged since previous exam. No acute displaced fractures. No destructive bony lesions. Paraspinal and other soft tissues: The paraspinal soft tissues are unremarkable. Atherosclerosis of the aorta, with bilateral iliac stents noted. Trace bilateral pleural effusions. Please refer to separately reported CT abdomen and pelvis exam for findings in that region. Disc levels: There is  mild-to-moderate central canal stenosis at L1-2 and L2-3 related to the prior L2 compression fracture, stable. At L3-4 there is circumferential disc bulge with bilateral facet and ligamentum flavum hypertrophy, resulting in severe central canal stenosis and symmetrical bilateral neural foraminal encroachment. At L4-5 there is partial bony fusion across the disc space again noted. Mild circumferential disc osteophyte complex and bilateral facet hypertrophy contribute to mild central canal stenosis. At L5-S1 there is circumferential disc bulge with bilateral facet and ligamentum flavum hypertrophy resulting in moderate central canal stenosis. Reconstructed images demonstrate no additional findings. IMPRESSION: 1. No acute lumbar spine fracture. 2. Stable chronic L2 compression deformity with greater than 75% loss of height. 3. Severe osteopenia. 4. Multilevel lumbar degenerative changes as above, with central canal stenosis most pronounced at L3-4 and L5-S1. 5. Please refer to separately reported CT abdomen and pelvis exam for findings in that region. Electronically Signed   By: Bobbye Burrow M.D.   On: 09/03/2023 16:08        Scheduled Meds:  ascorbic acid   500 mg Oral Daily   enoxaparin  (LOVENOX ) injection  30 mg Subcutaneous Q24H   leptospermum manuka honey  1 Application Topical Daily   metolazone   2.5 mg Oral Once per day on Monday Wednesday Friday   pantoprazole   40 mg Oral Daily   polyethylene glycol  17 g Oral BID   potassium chloride  SA  20 mEq Oral Daily   rosuvastatin   10 mg Oral Daily   sodium chloride  flush  3 mL Intravenous Q12H   sodium chloride  flush  3 mL Intravenous Q12H   torsemide   40 mg Oral Daily   Continuous Infusions:  sodium chloride      ceFEPime  (MAXIPIME ) IV       LOS: 1 day     Raymonde Calico, MD [facility]   If 7PM-7AM, please contact night-coverage [email or web address] Password [facility] 09/04/2023, 8:27 AM

## 2023-09-04 NOTE — Consult Note (Signed)
 NAME: [name]  DOB: Mar 05, 1934  MRN: [id number]  Date/Time: 09/04/2023 10:00 PM  REQUESTING PROVIDER: Dr>Wouk Subjective:  REASON FOR CONSULT: Gram neg bacteremia ? [name] is a 89 y.o. 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 , renal cell carcinoma status post right nephrectomy in 2003, right THA with removal with revision in 2020, NYHA IIIb Presented to the ED with shaking chills and pain legs of fairly sudden onset She has wound sin her legs and last saw Dr.Manadhar ID in [place] on 4/22 and was given cefadroxil  for 7 days Pt was having low back pain which is chronic but was worse that day Vitals in the ED 132/98, temp 102.4, pulse 38 and RR 22  Latest Reference Range & Units 09/03/23 16:00  WBC 4.0 - 10.5 K/uL 11.2 (H)  Hemoglobin 12.0 - 15.0 g/dL 9.7 (L)  HCT 78.2 - 95.6 % 33.7 (L)  Platelets 150 - 400 K/uL 273  Creatinine 0.44 - 1.00 mg/dL 2.13 (H)   Blood culture sent CT abdomen done for abdominal pain did not reveal any acute conditions Started on vanco and cefepime  I am seeing the patient as blood culture is positive for unidentified enterobacteriaceae   Past Medical History:  Diagnosis Date   (HFpEF) heart failure with preserved ejection fraction (HCC)    EF=60-60%   Actinic keratosis 01/17/2015   R forearm   Adjustment disorder with anxiety    Adverse effect of other narcotics, sequela    Intolerance to all narcotics   Anti-Duffy antibodies present    Aortic valve stenosis    Arthritis    "some in my hands" (11/11/2012)   Atrial fibrillation, permanent (HCC)    Eliquis    Atypical mole 03/25/2018   L forearm - severe   Automatic implantable cardioverter-defibrillator in situ    Avascular necrosis of hip (HCC)  05/03/2011   Carotid artery stenosis 09/2007   a. 09/2007: 60-79% bilateral (stable); b. 10/2008: 40-59% R 60-79%    Cellulitis of left lower extremity 07/13/2020   CKD (chronic kidney disease), stage III (HCC)    COPD (chronic obstructive pulmonary disease) (HCC)    Coronary artery disease    non-obstructive by 2006 cath   COVID-19 virus infection 02/12/2022   Displaced fracture of left femoral neck (HCC) 10/09/2018   GI bleed 03/28/2020   AVM   High cholesterol    Hypertension 05/20/2011   Hypertr obst cardiomyop    Hypotension, unspecified    cardiac cath 2006..nonobstructive CAD 30-40s lesions.Aaron AasETT 1/09 nondiagnostic due to poor HR response..Right Renal Cancer 2003   Iron  deficiency anemia    Long term (current) use of anticoagulants    On home oxygen  therapy    3L [place]   Osteoarthritis of right hip    PAD (peripheral artery disease) (HCC)    Pancreatic cancer (HCC)    sandostatin    PONV (postoperative nausea and vomiting)    Presence of permanent cardiac pacemaker    Pulmonary HTN (HCC)    RECTAL BLEEDING 10/13/2009   Qualifier: Diagnosis of  By: Daphane Dynes NP, Paula     Red blood cell antibody positive with compatible PRBC difficult to obtain    Anti FYA (Duffy a) antibody. Must be transfused with PRB which are Duffy Antigen Negative and Crossmatch Compatible  Renal cell carcinoma (HCC)    s/p nephrectomy   Squamous cell carcinoma of skin 01/17/2015   R lat wrist   Squamous cell carcinoma of skin 01/29/2018   R post upper leg - superficially invasive   Squamous cell carcinoma of skin 03/25/2018   L lat foot   Squamous cell carcinoma of skin 11/09/2020   left lat foot - EDC 01/01/21, recurrent 01/31/21 - MOHs 02/22/21   Squamous cell carcinoma of skin 12/19/2021   Right Posterior Medial Thigh, EDC   Squamous cell carcinoma of skin 12/19/2021   SCC IS, L lat heel, EDC 02/04/2022   Squamous cell carcinoma of skin 01/21/2022   L forearm, EDC 02/04/2022   Squamous cell  carcinoma of skin 01/21/2022   R lower leg below knee, EDC 02/04/2022   Squamous cell carcinoma of skin 01/21/2022   SCCIS, R post heel, EDC 02/04/2022   Squamous cell carcinoma of skin 07/01/2022   left lower abdomen, in situ, EDC   Squamous cell carcinoma of skin 07/01/2022   left medial chest, EDC   Urge incontinence    Venous stasis of both lower extremities     Past Surgical History:  Procedure Laterality Date   ABDOMINAL AORTOGRAM W/LOWER EXTREMITY N/A 02/12/2021   Procedure: ABDOMINAL AORTOGRAM W/LOWER EXTREMITY;  Surgeon: Adine Hoof, MD;  Location: [facility] INVASIVE CV LAB;  Service: Cardiovascular;  Laterality: N/A;   ABDOMINAL HYSTERECTOMY  1975   for benign causes   APPENDECTOMY     BI-VENTRICULAR PACEMAKER UPGRADE  05/04/2010   BIOPSY  02/28/2019   Procedure: BIOPSY;  Surgeon: Janel Medford, MD;  Location: [facility] ENDOSCOPY;  Service: Endoscopy;;   BIOPSY  03/04/2022   Procedure: BIOPSY;  Surgeon: Normie Becton., MD;  Location: [facility] ENDOSCOPY;  Service: Gastroenterology;;   CARDIAC CATHETERIZATION  2006   CARDIOVERSION N/A 02/20/2018   Procedure: CARDIOVERSION;  Surgeon: Devorah Fonder, MD;  Location: [facility] ORS;  Service: Cardiovascular;  Laterality: N/A;   CARDIOVERSION N/A 03/27/2018   Procedure: CARDIOVERSION;  Surgeon: Devorah Fonder, MD;  Location: [facility] ORS;  Service: Cardiovascular;  Laterality: N/A;   CATARACT EXTRACTION W/ INTRAOCULAR LENS  IMPLANT, BILATERAL  01/2006-02-2006   CHOLECYSTECTOMY N/A 11/11/2012   Procedure: LAPAROSCOPIC CHOLECYSTECTOMY WITH INTRAOPERATIVE CHOLANGIOGRAM;  Surgeon: Kari Otto. Eli Grizzle, MD;  Location: [facility] OR;  Service: General;  Laterality: N/A;   COLONOSCOPY WITH PROPOFOL  N/A 02/28/2019   Procedure: COLONOSCOPY WITH PROPOFOL ;  Surgeon: Janel Medford, MD;  Location: [facility] ENDOSCOPY;  Service: Endoscopy;  Laterality: N/A;   ENTEROSCOPY N/A 03/30/2020   Procedure: ENTEROSCOPY;  Surgeon: Annis Kinder, DO;  Location: [facility]  ENDOSCOPY;  Service: Gastroenterology;  Laterality: N/A;   ENTEROSCOPY N/A 02/10/2021   Procedure: ENTEROSCOPY;  Surgeon: Alvis Jourdain, MD;  Location: [facility] ENDOSCOPY;  Service: Endoscopy;  Laterality: N/A;   ENTEROSCOPY N/A 02/15/2022   Procedure: ENTEROSCOPY;  Surgeon: Daina Drum, MD;  Location: [facility] ENDOSCOPY;  Service: Gastroenterology;  Laterality: N/A;   ENTEROSCOPY N/A 11/06/2022   Procedure: ENTEROSCOPY;  Surgeon: Brice Campi Albino Alu., MD;  Location: [facility] ENDOSCOPY;  Service: Gastroenterology;  Laterality: N/A;   EP IMPLANTABLE DEVICE N/A 02/21/2016   Procedure: ICD Generator Changeout;  Surgeon: Verona Goodwill, MD;  Location: [facility] INVASIVE CV LAB;  Service: Cardiovascular;  Laterality: N/A;   ESOPHAGOGASTRODUODENOSCOPY N/A 03/04/2022   Procedure: ESOPHAGOGASTRODUODENOSCOPY (EGD);  Surgeon: Normie Becton., MD;  Location: [facility] ENDOSCOPY;  Service: Gastroenterology;  Laterality: N/A;   ESOPHAGOGASTRODUODENOSCOPY (EGD) WITH PROPOFOL  N/A 02/28/2019  Procedure: ESOPHAGOGASTRODUODENOSCOPY (EGD) WITH PROPOFOL ;  Surgeon: Janel Medford, MD;  Location: [facility] ENDOSCOPY;  Service: Endoscopy;  Laterality: N/A;   EUS N/A 03/04/2022   Procedure: UPPER ENDOSCOPIC ULTRASOUND (EUS) RADIAL;  Surgeon: Normie Becton., MD;  Location: [facility] ENDOSCOPY;  Service: Gastroenterology;  Laterality: N/A;   FINE NEEDLE ASPIRATION N/A 03/04/2022   Procedure: FINE NEEDLE ASPIRATION (FNA) LINEAR;  Surgeon: Normie Becton., MD;  Location: [facility] ENDOSCOPY;  Service: Gastroenterology;  Laterality: N/A;   GIVENS CAPSULE STUDY N/A 03/15/2019   Procedure: GIVENS CAPSULE STUDY;  Surgeon: Lindle Rhea, MD;  Location: [facility] ENDOSCOPY;  Service: Gastroenterology;  Laterality: N/A;   HOT HEMOSTASIS N/A 03/30/2020   Procedure: HOT HEMOSTASIS (ARGON PLASMA COAGULATION/BICAP);  Surgeon: Annis Kinder, DO;  Location: [facility] ENDOSCOPY;  Service: Gastroenterology;  Laterality: N/A;   HOT HEMOSTASIS N/A 02/10/2021    Procedure: HOT HEMOSTASIS (ARGON PLASMA COAGULATION/BICAP);  Surgeon: Alvis Jourdain, MD;  Location: [facility] ENDOSCOPY;  Service: Endoscopy;  Laterality: N/A;   HOT HEMOSTASIS N/A 02/15/2022   Procedure: HOT HEMOSTASIS (ARGON PLASMA COAGULATION/BICAP);  Surgeon: Daina Drum, MD;  Location: [facility] ENDOSCOPY;  Service: Gastroenterology;  Laterality: N/A;   HOT HEMOSTASIS N/A 11/06/2022   Procedure: HOT HEMOSTASIS (ARGON PLASMA COAGULATION/BICAP);  Surgeon: Normie Becton., MD;  Location: [facility] ENDOSCOPY;  Service: Gastroenterology;  Laterality: N/A;   INSERT / REPLACE / REMOVE PACEMAKER  05-01-11   02-28-05-/05-04-10-ICD-MEDTRONIC MAXIMAL DR   IR ANGIOGRAM SELECTIVE EACH ADDITIONAL VESSEL  02/18/2022   IR ANGIOGRAM SELECTIVE EACH ADDITIONAL VESSEL  02/18/2022   IR ANGIOGRAM VISCERAL SELECTIVE  02/16/2022   IR ANGIOGRAM VISCERAL SELECTIVE  02/16/2022   IR EMBO ART  VEN HEMORR LYMPH EXTRAV  INC GUIDE ROADMAPPING  02/16/2022   IR THORACENTESIS ASP PLEURAL SPACE W/IMG GUIDE  02/21/2022   IR THORACENTESIS ASP PLEURAL SPACE W/IMG GUIDE  02/22/2022   IR US  GUIDE VASC ACCESS RIGHT  02/16/2022   JOINT REPLACEMENT     LAPAROSCOPIC CHOLECYSTECTOMY  11/11/2012   LAPAROSCOPIC LYSIS OF ADHESIONS N/A 11/11/2012   Procedure: LAPAROSCOPIC LYSIS OF ADHESIONS;  Surgeon: Kari Otto. Eli Grizzle, MD;  Location: [facility] OR;  Service: General;  Laterality: N/A;   NEPHRECTOMY Right 06/2001    S/P RENAL CELL CANCER   PERIPHERAL VASCULAR INTERVENTION Bilateral 02/12/2021   Procedure: PERIPHERAL VASCULAR INTERVENTION;  Surgeon: Adine Hoof, MD;  Location: [facility] INVASIVE CV LAB;  Service: Cardiovascular;  Laterality: Bilateral;  Iliac artery stents   PRESSURE SENSOR/CARDIOMEMS N/A 02/03/2019   Procedure: PRESSURE SENSOR/CARDIOMEMS;  Surgeon: Darlis Eisenmenger, MD;  Location: [facility] INVASIVE CV LAB;  Service: Cardiovascular;  Laterality: N/A;   RIGHT HEART CATH N/A 11/09/2018   Procedure: RIGHT HEART CATH;  Surgeon: Darlis Eisenmenger, MD;   Location: [facility] INVASIVE CV LAB;  Service: Cardiovascular;  Laterality: N/A;   RIGHT HEART CATH N/A 03/08/2019   Procedure: RIGHT HEART CATH;  Surgeon: Darlis Eisenmenger, MD;  Location: [facility] INVASIVE CV LAB;  Service: Cardiovascular;  Laterality: N/A;   SUBMUCOSAL TATTOO INJECTION  02/28/2019   Procedure: SUBMUCOSAL TATTOO INJECTION;  Surgeon: Janel Medford, MD;  Location: [facility] ENDOSCOPY;  Service: Endoscopy;;   SUBMUCOSAL TATTOO INJECTION  11/06/2022   Procedure: SUBMUCOSAL TATTOO INJECTION;  Surgeon: Normie Becton., MD;  Location: [facility] ENDOSCOPY;  Service: Gastroenterology;;   TOTAL HIP ARTHROPLASTY Right 05/03/2011   Procedure: TOTAL HIP ARTHROPLASTY ANTERIOR APPROACH;  Surgeon: Arnie Lao;  Location: [facility] ORS;  Service: Orthopedics;  Laterality: Right;  Removal of Cannulated Screws Right Hip, Right  Direct Anterior Hip Replacement   TOTAL HIP ARTHROPLASTY Left 10/09/2018   Procedure: TOTAL HIP ARTHROPLASTY ANTERIOR APPROACH;  Surgeon: Adonica Hoose, MD;  Location: [facility] OR;  Service: Orthopedics;  Laterality: Left;    Social History   Socioeconomic History   Marital status: Widowed    Spouse name: Not on file   Number of children: 2   Years of education: high school   Highest education level: Not on file  Occupational History   Occupation: Retired    Associate Professor: RETIRED  Tobacco Use   Smoking status: Former    Current packs/day: 0.00    Average packs/day: 0.5 packs/day for 40.0 years (20.0 ttl pk-yrs)    Types: Cigarettes    Start date: 05/06/1961    Quit date: 05/06/2001    Years since quitting: 22.3   Smokeless tobacco: Never  Vaping Use   Vaping status: Never Used  Substance and Sexual Activity   Alcohol use: No    Alcohol/week: 0.0 standard drinks of alcohol   Drug use: No   Sexual activity: Not Currently  Other Topics Concern   Not on file  Social History Narrative   Would desire CPR   07/11/20   From: the area   Living: with great-grandson - Herminia Lope (2003)   Work:  retired - clerical      Family: 2 children - Amalia Badder and [place] - 2 grand chlidren - 5 great grand children      Enjoys: spending time with friends - watch move, eat out, spend time      Exercise: walking around the house   Diet: not good, limits red meat, limits salt, low appetite      Safety   Seat belts: Yes    Guns: Yes  and secure   Safe in relationships: Yes    Social Drivers of Health   Financial Resource Strain: Low Risk  (05/28/2022)   Overall Financial Resource Strain (CARDIA)    Difficulty of Paying Living Expenses: Not hard at all  Food Insecurity: No Food Insecurity (09/03/2023)   Hunger Vital Sign    Worried About Running Out of Food in the Last Year: Never true    Ran Out of Food in the Last Year: Never true  Transportation Needs: No Transportation Needs (09/03/2023)   PRAPARE - Administrator, Civil Service (Medical): No    Lack of Transportation (Non-Medical): No  Physical Activity: Inactive (09/24/2021)   Exercise Vital Sign    Days of Exercise per Week: 0 days    Minutes of Exercise per Session: 0 min  Stress: No Stress Concern Present (09/24/2021)   [employer] of Occupational Health - Occupational Stress Questionnaire    Feeling of Stress : Not at all  Social Connections: Socially Isolated (09/03/2023)   Social Connection and Isolation Panel [NHANES]    Frequency of Communication with Friends and Family: More than three times a week    Frequency of Social Gatherings with Friends and Family: More than three times a week    Attends Religious Services: Never    Database administrator or Organizations: No    Attends Banker Meetings: Never    Marital Status: Widowed  Intimate Partner Violence: Not At Risk (09/03/2023)   Humiliation, Afraid, Rape, and Kick questionnaire    Fear of Current or Ex-Partner: No    Emotionally Abused: No    Physically Abused: No    Sexually Abused: No    Family History  Problem  Relation Age of Onset    Heart failure Mother    Early death Father        car accident   Breast cancer Maternal Aunt 75   Breast cancer Cousin    Breast cancer Other    Colon cancer Neg Hx    Esophageal cancer Neg Hx    Liver disease Neg Hx    Pancreatic cancer Neg Hx    Rectal cancer Neg Hx    Allergies  Allergen Reactions   Codeine Nausea And Vomiting and Other (See Comments)    Hallucinations, too- CANNOT HAVE; thinks she may have received Narcan  to reverse the effect   Dilaudid  [Hydromorphone ] Other (See Comments)    Excessive Somnolence- Required Narcan  X2   Tape Other (See Comments)    CAN TOLERATE ONLY EASY-RELEASE, PAPER TAPE, AS THE SKIN IS VERY THIN AND WILL BRUISE AND TEAR VERY EASILY   Amoxil  [Amoxicillin ] Nausea Only   Asa [Aspirin ] Other (See Comments)    Told to avoid due to reduced kidney function, has 1 remaining kidney   Ms Contin  [Morphine ] Nausea And Vomiting and Other (See Comments)    Hallucinations, also   Neurontin  [Gabapentin ] Other (See Comments)    "Out of it"   Nsaids Other (See Comments)    Told to avoid due to reduced kidney function, has 1 remaining kidney   I? Current Facility-Administered Medications  Medication Dose Route Frequency Provider Last Rate Last Admin   acetaminophen  (TYLENOL ) tablet 650 mg  650 mg Oral Q6H PRN Althia Atlas, MD   650 mg at 09/04/23 0012   Or   acetaminophen  (TYLENOL ) suppository 650 mg  650 mg Rectal Q6H PRN Althia Atlas, MD       ascorbic acid  (VITAMIN C ) tablet 500 mg  500 mg Oral Daily Althia Atlas, MD   500 mg at 09/04/23 0834   bisacodyl  (DULCOLAX) EC tablet 10 mg  10 mg Oral Daily PRN Althia Atlas, MD       bisacodyl  (DULCOLAX) suppository 10 mg  10 mg Rectal Daily PRN Althia Atlas, MD       ceFEPIme  (MAXIPIME ) 2 g in sodium chloride  0.9 % 100 mL IVPB  2 g Intravenous Q24H Adalberto Acton, RPH 200 mL/hr at 09/04/23 1541 2 g at 09/04/23 1541   enoxaparin  (LOVENOX ) injection 30 mg  30 mg Subcutaneous Q24H Althia Atlas, MD   30 mg  at 09/04/23 2040   fentaNYL  (SUBLIMAZE ) injection 12.5 mcg  12.5 mcg Intravenous Q4H PRN Mansy, Jan A, MD   12.5 mcg at 09/04/23 2041   hydrocerin (EUCERIN) cream   Topical Daily Wouk, Haynes Lips, MD       leptospermum manuka honey (MEDIHONEY) paste 1 Application  1 Application Topical Daily Althia Atlas, MD       methocarbamol  (ROBAXIN ) tablet 500 mg  500 mg Oral Q8H PRN Althia Atlas, MD       pantoprazole  (PROTONIX ) EC tablet 40 mg  40 mg Oral Daily Althia Atlas, MD   40 mg at 09/04/23 0834   polyethylene glycol (MIRALAX  / GLYCOLAX ) packet 17 g  17 g Oral BID Althia Atlas, MD       potassium chloride  SA (KLOR-CON  M) CR tablet 20 mEq  20 mEq Oral Daily Althia Atlas, MD   20 mEq at 09/03/23 2137   rosuvastatin  (CRESTOR ) tablet 10 mg  10 mg Oral Daily Althia Atlas, MD   10 mg at 09/04/23 0834   sodium chloride  flush (NS) 0.9 %  injection 3 mL  3 mL Intravenous Q12H Althia Atlas, MD   3 mL at 09/04/23 2042   sodium chloride  flush (NS) 0.9 % injection 3 mL  3 mL Intravenous Q12H Althia Atlas, MD   3 mL at 09/04/23 2042   sodium chloride  flush (NS) 0.9 % injection 3 mL  3 mL Intravenous PRN Althia Atlas, MD         Abtx:  Anti-infectives (From admission, onward)    Start     Dose/Rate Route Frequency Ordered Stop   09/05/23 1700  vancomycin  (VANCOCIN ) IVPB 1000 mg/200 mL premix  Status:  Discontinued        1,000 mg 200 mL/hr over 60 Minutes Intravenous Every 48 hours 09/03/23 1743 09/04/23 0821   09/04/23 1600  ceFEPIme  (MAXIPIME ) 2 g in sodium chloride  0.9 % 100 mL IVPB        2 g 200 mL/hr over 30 Minutes Intravenous Every 24 hours 09/03/23 1743     09/03/23 1545  ceFEPIme  (MAXIPIME ) 2 g in sodium chloride  0.9 % 100 mL IVPB        2 g 200 mL/hr over 30 Minutes Intravenous  Once 09/03/23 1534 09/03/23 1633   09/03/23 1545  metroNIDAZOLE  (FLAGYL ) IVPB 500 mg        500 mg 100 mL/hr over 60 Minutes Intravenous  Once 09/03/23 1534 09/03/23 1737   09/03/23 1545  vancomycin   (VANCOCIN ) IVPB 1000 mg/200 mL premix        1,000 mg 200 mL/hr over 60 Minutes Intravenous  Once 09/03/23 1534 09/03/23 1835       REVIEW OF SYSTEMS:  Const:  fever,  chills, negative weight loss Eyes: negative diplopia or visual changes, negative eye pain ENT: negative coryza, negative sore throat Resp: negative cough, hemoptysis,  has dyspnea Cards: has chest pain, no palpitations, +lower extremity edema GU: negative for frequency, dysuria and hematuria GI: + abdominal pain, + diarrhea, no bleeding, constipation Skin: negative for rash and pruritus Heme: negative for easy bruising and gum/nose bleeding MS: weakness Neurolo:negative for headaches, dizziness, vertigo, memory problems  Psych:  anxiety, depression  Endocrine: negative for thyroid , diabetes Allergy/Immunology- as above Objective:  VITALS:  BP (!) 114/47 (BP Location: Left Arm)   Pulse 64   Temp 98.1 F (36.7 C) (Oral)   Resp 17   Ht 5' 6.5" (1.689 m)   Wt 64.1 kg   SpO2 100%   BMI 22.47 kg/m   PHYSICAL EXAM:  General: Alert, cooperative, no distress, appears stated age.  Head: Normocephalic, without obvious abnormality, atraumatic. Eyes: Conjunctivae clear, anicteric sclerae. Pupils are equal ENT Nares normal. No drainage or sinus tenderness. Lips, mucosa, and tongue normal. No Thrush Neck: Supple, symmetrical, no adenopathy, thyroid : non tender no carotid bruit and no JVD. Back: No CVA tenderness. Lungs: b/l air entry Heart: systolic murmur 4/6. Abdomen: Soft, non-tender,not distended. Bowel sounds normal. No masses Extremities: atraumatic, no cyanosis. No edema. No clubbing Skin: b/l leg some erythema- crusted wounds       Lymph: Cervical, supraclavicular normal. Neurologic: Grossly non-focal Pertinent Labs Lab Results CBC    Component Value Date/Time   WBC 10.1 09/04/2023 0502   RBC 3.51 (L) 09/04/2023 0502   HGB 8.2 (L) 09/04/2023 0502   HGB 8.3 (L) 08/11/2023 1114   HGB 8.1 (L)  06/09/2023 1456   HCT 27.5 (L) 09/04/2023 0502   HCT 28.9 (L) 06/09/2023 1456   PLT 212 09/04/2023 0502   PLT 194 08/11/2023 1114  PLT 256 06/09/2023 1456   MCV 78.3 (L) 09/04/2023 0502   MCV 84 06/09/2023 1456   MCH 23.4 (L) 09/04/2023 0502   MCHC 29.8 (L) 09/04/2023 0502   RDW 19.5 (H) 09/04/2023 0502   RDW 19.5 (H) 06/09/2023 1456   LYMPHSABS 1.1 09/03/2023 1600   LYMPHSABS 0.6 (L) 03/22/2019 1415   MONOABS 0.9 09/03/2023 1600   EOSABS 0.0 09/03/2023 1600   EOSABS 0.2 03/22/2019 1415   BASOSABS 0.0 09/03/2023 1600   BASOSABS 0.0 03/22/2019 1415       Latest Ref Rng & Units 09/04/2023    5:02 AM 09/03/2023    4:00 PM 08/04/2023   12:56 PM  CMP  Glucose 70 - 99 mg/dL 99  74  295   BUN 8 - 23 mg/dL 51  50  59   Creatinine 0.44 - 1.00 mg/dL 6.21  3.08  6.57   Sodium 135 - 145 mmol/L 131  134  135   Potassium 3.5 - 5.1 mmol/L 4.2  3.7  4.0   Chloride 98 - 111 mmol/L 97  91  94   CO2 22 - 32 mmol/L 28  29  32   Calcium  8.9 - 10.3 mg/dL 7.9  9.1  9.4   Total Protein 6.5 - 8.1 g/dL  7.8  7.8   Total Bilirubin 0.0 - 1.2 mg/dL  1.1  0.6   Alkaline Phos 38 - 126 U/L  61  51   AST 15 - 41 U/L  21  15   ALT 0 - 44 U/L  10  7       Microbiology: Recent Results (from the past 240 hours)  Resp panel by RT-PCR (RSV, Flu A&B, Covid) Anterior Nasal Swab     Status: None   Collection Time: 09/03/23  3:34 PM   Specimen: Anterior Nasal Swab  Result Value Ref Range Status   SARS Coronavirus 2 by RT PCR NEGATIVE NEGATIVE Final    Comment: (NOTE) SARS-CoV-2 target nucleic acids are NOT DETECTED.  The SARS-CoV-2 RNA is generally detectable in upper respiratory specimens during the acute phase of infection. The lowest concentration of SARS-CoV-2 viral copies this assay can detect is 138 copies/mL. A negative result does not preclude SARS-Cov-2 infection and should not be used as the sole basis for treatment or other patient management decisions. A negative result may occur with   improper specimen collection/handling, submission of specimen other than nasopharyngeal swab, presence of viral mutation(s) within the areas targeted by this assay, and inadequate number of viral copies(<138 copies/mL). A negative result must be combined with clinical observations, patient history, and epidemiological information. The expected result is Negative.  Fact Sheet for Patients:  BloggerCourse.com  Fact Sheet for Healthcare Providers:  SeriousBroker.it  This test is no t yet approved or cleared by the [place]  FDA and  has been authorized for detection and/or diagnosis of SARS-CoV-2 by FDA under an Emergency Use Authorization (EUA). This EUA will remain  in effect (meaning this test can be used) for the duration of the COVID-19 declaration under Section 564(b)(1) of the Act, 21 U.S.C.section 360bbb-3(b)(1), unless the authorization is terminated  or revoked sooner.       Influenza A by PCR NEGATIVE NEGATIVE Final   Influenza B by PCR NEGATIVE NEGATIVE Final    Comment: (NOTE) The Xpert Xpress SARS-CoV-2/FLU/RSV plus assay is intended as an aid in the diagnosis of influenza from Nasopharyngeal swab specimens and should not be used as a sole basis for  treatment. Nasal washings and aspirates are unacceptable for Xpert Xpress SARS-CoV-2/FLU/RSV testing.  Fact Sheet for Patients: BloggerCourse.com  Fact Sheet for Healthcare Providers: SeriousBroker.it  This test is not yet approved or cleared by the [place]  FDA and has been authorized for detection and/or diagnosis of SARS-CoV-2 by FDA under an Emergency Use Authorization (EUA). This EUA will remain in effect (meaning this test can be used) for the duration of the COVID-19 declaration under Section 564(b)(1) of the Act, 21 U.S.C. section 360bbb-3(b)(1), unless the authorization is terminated or revoked.      Resp Syncytial Virus by PCR NEGATIVE NEGATIVE Final    Comment: (NOTE) Fact Sheet for Patients: BloggerCourse.com  Fact Sheet for Healthcare Providers: SeriousBroker.it  This test is not yet approved or cleared by the [place]  FDA and has been authorized for detection and/or diagnosis of SARS-CoV-2 by FDA under an Emergency Use Authorization (EUA). This EUA will remain in effect (meaning this test can be used) for the duration of the COVID-19 declaration under Section 564(b)(1) of the Act, 21 U.S.C. section 360bbb-3(b)(1), unless the authorization is terminated or revoked.  Performed at [facility], [address], [place], [place] [address]   Blood Culture (routine x 2)     Status: None (Preliminary result)   Collection Time: 09/03/23  3:38 PM   Specimen: BLOOD  Result Value Ref Range Status   Specimen Description   Final    BLOOD RIGHT ANTECUBITAL Performed at [facility], [address], [place], [place] [address]    Special Requests   Final    BOTTLES DRAWN AEROBIC AND ANAEROBIC Blood Culture adequate volume Performed at [facility], [address] [place], [place] [address]    Culture  Setup Time   Final    GRAM NEGATIVE RODS IN BOTH AEROBIC AND ANAEROBIC BOTTLES Organism ID to follow CRITICAL RESULT CALLED TO, READ BACK BY AND VERIFIED WITH: MADISON HUNT PHARMD 0725 09/04/23 HNM GRAM STAIN REVIEWED-AGREE WITH RESULT DRT Performed at [facility], [address] [place], [place] [address]    Culture GRAM NEGATIVE RODS  Final   Report Status PENDING  Incomplete  Blood Culture ID Panel (Reflexed)     Status: Abnormal   Collection Time: 09/03/23  3:38 PM  Result Value Ref Range Status   Enterococcus faecalis NOT DETECTED NOT DETECTED Final   Enterococcus Faecium NOT DETECTED NOT DETECTED Final   Listeria monocytogenes NOT DETECTED NOT DETECTED Final   Staphylococcus species  NOT DETECTED NOT DETECTED Final   Staphylococcus aureus (BCID) NOT DETECTED NOT DETECTED Final   Staphylococcus epidermidis NOT DETECTED NOT DETECTED Final   Staphylococcus lugdunensis NOT DETECTED NOT DETECTED Final   Streptococcus species NOT DETECTED NOT DETECTED Final   Streptococcus agalactiae NOT DETECTED NOT DETECTED Final   Streptococcus pneumoniae NOT DETECTED NOT DETECTED Final   Streptococcus pyogenes NOT DETECTED NOT DETECTED Final   A.calcoaceticus-baumannii NOT DETECTED NOT DETECTED Final   Bacteroides fragilis NOT DETECTED NOT DETECTED Final   Enterobacterales DETECTED (A) NOT DETECTED Final    Comment: Enterobacterales represent a large order of gram negative bacteria, not a single organism. Refer to culture for further identification. CRITICAL RESULT CALLED TO, READ BACK BY AND VERIFIED WITH: MADISON HUNT PHARMD 0725 09/04/23 HNM    Enterobacter cloacae complex NOT DETECTED NOT DETECTED Final   Escherichia coli NOT DETECTED NOT DETECTED Final   Klebsiella aerogenes NOT DETECTED NOT DETECTED Final   Klebsiella oxytoca NOT DETECTED NOT DETECTED Final  Klebsiella pneumoniae NOT DETECTED NOT DETECTED Final   Proteus species NOT DETECTED NOT DETECTED Final   Salmonella species NOT DETECTED NOT DETECTED Final   Serratia marcescens NOT DETECTED NOT DETECTED Final   Haemophilus influenzae NOT DETECTED NOT DETECTED Final   Neisseria meningitidis NOT DETECTED NOT DETECTED Final   Pseudomonas aeruginosa NOT DETECTED NOT DETECTED Final   Stenotrophomonas maltophilia NOT DETECTED NOT DETECTED Final   Candida albicans NOT DETECTED NOT DETECTED Final   Candida auris NOT DETECTED NOT DETECTED Final   Candida glabrata NOT DETECTED NOT DETECTED Final   Candida krusei NOT DETECTED NOT DETECTED Final   Candida parapsilosis NOT DETECTED NOT DETECTED Final   Candida tropicalis NOT DETECTED NOT DETECTED Final   Cryptococcus neoformans/gattii NOT DETECTED NOT DETECTED Final   CTX-M ESBL NOT  DETECTED NOT DETECTED Final   Carbapenem resistance IMP NOT DETECTED NOT DETECTED Final   Carbapenem resistance KPC NOT DETECTED NOT DETECTED Final   Carbapenem resistance NDM NOT DETECTED NOT DETECTED Final   Carbapenem resist OXA 48 LIKE NOT DETECTED NOT DETECTED Final   Carbapenem resistance VIM NOT DETECTED NOT DETECTED Final    Comment: Performed at [facility], [address] [place], [place] [address]  Blood Culture (routine x 2)     Status: None (Preliminary result)   Collection Time: 09/03/23  4:00 PM   Specimen: BLOOD LEFT FOREARM  Result Value Ref Range Status   Specimen Description   Final    BLOOD LEFT FOREARM Performed at [facility], [address], [place], [place] [address]    Special Requests   Final    BOTTLES DRAWN AEROBIC AND ANAEROBIC Blood Culture adequate volume Performed at [facility], [address] [place], [place] [address]    Culture   Final    GRAM NEGATIVE RODS IN BOTH AEROBIC AND ANAEROBIC BOTTLES CRITICAL VALUE NOTED.  VALUE IS CONSISTENT WITH PREVIOUSLY REPORTED AND CALLED VALUE. GRAM STAIN REVIEWED-AGREE WITH RESULT DRT Performed at [facility], [address], [place], [place] [address]    Report Status PENDING  Incomplete    IMAGING RESULTS: CT abdomen- no acute findings  I have personally reviewed the films ? Impression/Recommendation ? 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 Recommend surgical opinion for biopsy  Chronic diastolic CHF Moderate MS/AS  Neuroendocrine tumor of the pancreas diagnosed in 2023 and is on Sandostatin  every 28 days  Anemia secondary to the above and also has had angiodysplasia in the jejunum and has received argon laser therapy She gets iron  infusion as well as PRBC as  needed   CKD   Single kidney:Rt nephrectomy for RCC     A-fib not on Eliquis  because of the GI bleed   AICD  COPD   B/l THA   Discussed with patient and family at bedside.

## 2023-09-04 NOTE — Progress Notes (Signed)
   This pt is active with [facility] a home based Palliative care program provided by [facility]. We will be following the pt while hospitalized and assist with any coordination of care needed at d/c.   This pt is eligible for other home health services under this program if needed at d/c.   Lyla Samuels RN [phone]

## 2023-09-04 NOTE — Progress Notes (Signed)
   09/03/23 2203  Provider Notification  Provider Name/Title Mansy MD  Date Provider Notified 09/03/23  Time Provider Notified 2203  Method of Notification Page (secure chat)  Notification Reason Change in status;Other (Comment) (wound care refusal)  Provider response See new orders  Date of Provider Response 09/03/23  Time of Provider Response 2232   On admission pt was offered wound care for bilateral lower extremities per orders. Pt declined due to pain and sensitivity. Nurse provided education. Pt stated she wanted to 'wait until it didn't hurt as much in a couple days'. Nurse provided additional education.Prior to messaging the on call provider Dr. Achilles Holes pt was again offered to have wound care performed. Pt declined. On call provider Dr. Achilles Holes was notified at 2203 of pt's decision.   Approximately at 0012 pt was given medication to treat her pain and offered to have wound care performed if her pain lessened. Pt declined stated 'I want to give it a couple days'. Nurse then educated pt. After reassessing pt's pain nurse again offered to perform wound care. Pt declined.  Approximately around 0400 pt called for more pain medication. Pain was assessed and medication administered. After reassessing pt's pain wound care was offered. Pt declined.

## 2023-09-04 NOTE — Consult Note (Signed)
 Advanced Heart Failure Team Consult Note   Primary Physician: Claire Crick, MD Cardiologist:  Belva Boyden, MD  Reason for Consultation: CHF, chest pain  HPI:    [name] is seen today for evaluation of CHF and chest pain at the request of Dr. Sari Cunning.   89 y.o. with history of permanent atrial fibrillation, chronic diastolic CHF, CKD stage 3, smoking/prior COPD, renal cell CA s/p right nephrectomy. She has a prior history of HFrEF with nonischemic cardiomyopathy and had a Medtronic ICD placed. However, subsequently her EF has recovered.      10/22, she had bilateral CIA stents placed. ABIs in 11/22 showed patent stents. She had a skin cancer removed from her left leg. 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    Echo 2/23 EF 65-70% with mild LVH, D-shaped septum with moderate RV enlargement, and mildly decreased RV function, PASP 92, moderate-severe TR, dilated IVC, moderate aortic stenosis. Echo in 10/23 showed EF 60-65%, D-shaped septum, mild RV enlargement with normal RV systolic function, PASP 90, moderate-severe TR, mild AS, IVC dilated.    Patient was found to have a neuroendocrine tumor of the pancreatic head in the fall of 2023.  She has been treated with sandostatin  monthly.  She also had further GI bleeding from small bowel AVMs in fall 2023. She also had COVID-19 in 10/23 and has been on home oxygen  2L since that time.    Admit 07/24 with acute on chronic blood loss anemia 2/2 GI bleed. Transfused and given IV iron . Had small bowel enteroscopy which showed 2 nonbleeding angiodysplastic lesions in duodenum and 3 nonbleeding angioplastic lesions in jejunum treated with APC.   Admitted early 09/24 with acute on chronic anemia suspected 2/2 chronic GI blood loss. Hgb down to 6.8. Received 2 u RBCs. GI did not recommend any endoscopic procedures d/t advanced age and comorbidities. Course further c/b acute on chronic CHF. She was diuresed  with IV lasix  and metolazone . Echo during admit with LVEF 65-70%, D-shaped septum, mildly reduced RV, RVSP 103 mmHg, biatrial enlargement, severe TR, moderate mitral stenosis with mean gradient of 6 mmHg, dilated IVC.   She was seen back in the [facility] on 01/30/23 for post hospital f/u and noted recurrent melena, SOB and fatigued w/ minimal exertion. Also noted to have recurrent volume overload w/ marked return of LEE. Hgb was down to 6.6. She was sent back to the ED and readmitted again for a/c CHF and GIB, treated w/ blood transfusions, IV Fe and diuretics. Discharged on 02/06/23 on torsemide  100 mg bid.  Anticoagulation stopped due to recurrent GI bleeding.     Admitted 05/27/23 due to shortness of breath/ leg swelling. Initially given IV lasix   with removal of ~ 4Land transitioned to oral torsemide  at 100mg  BID at discharge. Metolazone  decreased to weekly. Treated with antibiotics for leg cellulitis. Given 1 unit PRBC's due to hemoglobin trending down to 7.1. Wound care consulted due to weeping area on left shin. Palliative care also consulted.    She was admitted in 2/25 with lower leg cellulitis.  She was treated with IV abx.    Patient was readmitted with fever/chills, found to have elevated lactate at 3.2 and GNR bacteremia.  She had evidence for lower extremity cellulitis as likely source for sepsis.  She was treated with vancomycin /cefepime , now simplified to cefepime  alone.  Lactate has normalized, 1.2 on last check.  Diuretics were stopped and she has been started on  gentle IVF, NS 75 cc/hr currently.  She reported pleuritic-type chest pain and troponin was checked, HS-TnI 1072 => 900.  Currently, she denies further chest pain.  She denies dyspnea at rest.     Home Medications Prior to Admission medications   Medication Sig Start Date End Date Taking? Authorizing Provider  acetaminophen  (TYLENOL ) 500 MG tablet Take 1,000 mg by mouth every 8 (eight) hours.   Yes [provider]  Ascorbic  Acid (VITAMIN C  PO) Take 1 tablet by mouth daily.   Yes [provider]  Cholecalciferol  (VITAMIN D -3 PO) Take 2 capsules by mouth daily.   Yes [provider]  Cyanocobalamin  (B-12 PO) Take 1 tablet by mouth daily.   Yes [provider]  ferrous gluconate  (FERGON) 324 MG tablet Take 1 tablet (324 mg total) by mouth daily with breakfast. 11/15/22  Yes Rai, Ripudeep K, MD  fluticasone  (FLONASE ) 50 MCG/ACT nasal spray Place 2 sprays into both nostrils daily as needed for allergies or rhinitis. 07/04/22  Yes Claire Crick, MD  metolazone  (ZAROXOLYN ) 2.5 MG tablet Take 1 tablet (2.5 mg total) by mouth 3 (three) times a week. Every Tues., Thurs and Sat. with 40 meq of potassium 07/25/23  Yes Lauralee Poll, MD  pantoprazole  (PROTONIX ) 40 MG tablet Take 1 tablet (40 mg total) by mouth daily. 11/09/22  Yes Rai, Ripudeep K, MD  Potassium Chloride  ER 20 MEQ TBCR Take 1 tablet (20 mEq total) by mouth daily. 07/24/23 10/22/23 Yes Lauralee Poll, MD  rosuvastatin  (CRESTOR ) 10 MG tablet Take 1 tablet (10 mg total) by mouth daily. 06/20/23  Yes Brenna Cam, MD  torsemide  (DEMADEX ) 20 MG tablet Take 5 tablets (100 mg total) by mouth 2 (two) times daily. 07/10/23  Yes Darlis Eisenmenger, MD  cephALEXin  (KEFLEX ) 500 MG capsule Take 1 capsule (500 mg total) by mouth 3 (three) times daily. Patient not taking: Reported on 09/03/2023 08/11/23   Sonja Akron, MD  CRANBERRY PO Take 1 tablet by mouth daily.    [provider]  Multiple Vitamins-Minerals (HAIR SKIN NAILS PO) Take 1 capsule by mouth daily.    [provider]  potassium chloride  SA (KLOR-CON  M) 20 MEQ tablet Take 20 mEq by mouth daily. 07/10/23   [provider]    Past Medical History: Past Medical History:  Diagnosis Date   (HFpEF) heart failure with preserved ejection fraction (HCC)    EF=60-60%   Actinic keratosis 01/17/2015   R forearm   Adjustment disorder with anxiety    Adverse effect of other  narcotics, sequela    Intolerance to all narcotics   Anti-Duffy antibodies present    Aortic valve stenosis    Arthritis    "some in my hands" (11/11/2012)   Atrial fibrillation, permanent (HCC)    Eliquis    Atypical mole 03/25/2018   L forearm - severe   Automatic implantable cardioverter-defibrillator in situ    Avascular necrosis of hip (HCC) 05/03/2011   Carotid artery stenosis 09/2007   a. 09/2007: 60-79% bilateral (stable); b. 10/2008: 40-59% R 60-79%    Cellulitis of left lower extremity 07/13/2020   CKD (chronic kidney disease), stage III (HCC)    COPD (chronic obstructive pulmonary disease) (HCC)    Coronary artery disease    non-obstructive by 2006 cath   COVID-19 virus infection 02/12/2022   Displaced fracture of left femoral neck (HCC) 10/09/2018   GI bleed 03/28/2020   AVM   High cholesterol    Hypertension 05/20/2011  Hypertr obst cardiomyop    Hypotension, unspecified    cardiac cath 2006..nonobstructive CAD 30-40s lesions.Aaron AasETT 1/09 nondiagnostic due to poor HR response..Right Renal Cancer 2003   Iron  deficiency anemia    Long term (current) use of anticoagulants    On home oxygen  therapy    3L [place]   Osteoarthritis of right hip    PAD (peripheral artery disease) (HCC)    Pancreatic cancer (HCC)    sandostatin    PONV (postoperative nausea and vomiting)    Presence of permanent cardiac pacemaker    Pulmonary HTN (HCC)    RECTAL BLEEDING 10/13/2009   Qualifier: Diagnosis of  By: Daphane Dynes NP, Paula     Red blood cell antibody positive with compatible PRBC difficult to obtain    Anti FYA (Duffy a) antibody. Must be transfused with PRB which are Duffy Antigen Negative and Crossmatch Compatible   Renal cell carcinoma (HCC)    s/p nephrectomy   Squamous cell carcinoma of skin 01/17/2015   R lat wrist   Squamous cell carcinoma of skin 01/29/2018   R post upper leg - superficially invasive   Squamous cell carcinoma of skin 03/25/2018   L lat foot   Squamous cell  carcinoma of skin 11/09/2020   left lat foot - EDC 01/01/21, recurrent 01/31/21 - MOHs 02/22/21   Squamous cell carcinoma of skin 12/19/2021   Right Posterior Medial Thigh, EDC   Squamous cell carcinoma of skin 12/19/2021   SCC IS, L lat heel, EDC 02/04/2022   Squamous cell carcinoma of skin 01/21/2022   L forearm, EDC 02/04/2022   Squamous cell carcinoma of skin 01/21/2022   R lower leg below knee, EDC 02/04/2022   Squamous cell carcinoma of skin 01/21/2022   SCCIS, R post heel, EDC 02/04/2022   Squamous cell carcinoma of skin 07/01/2022   left lower abdomen, in situ, EDC   Squamous cell carcinoma of skin 07/01/2022   left medial chest, EDC   Urge incontinence    Venous stasis of both lower extremities     Past Surgical History: Past Surgical History:  Procedure Laterality Date   ABDOMINAL AORTOGRAM W/LOWER EXTREMITY N/A 02/12/2021   Procedure: ABDOMINAL AORTOGRAM W/LOWER EXTREMITY;  Surgeon: Adine Hoof, MD;  Location: [facility] INVASIVE CV LAB;  Service: Cardiovascular;  Laterality: N/A;   ABDOMINAL HYSTERECTOMY  1975   for benign causes   APPENDECTOMY     BI-VENTRICULAR PACEMAKER UPGRADE  05/04/2010   BIOPSY  02/28/2019   Procedure: BIOPSY;  Surgeon: Janel Medford, MD;  Location: [facility] ENDOSCOPY;  Service: Endoscopy;;   BIOPSY  03/04/2022   Procedure: BIOPSY;  Surgeon: Normie Becton., MD;  Location: [facility] ENDOSCOPY;  Service: Gastroenterology;;   CARDIAC CATHETERIZATION  2006   CARDIOVERSION N/A 02/20/2018   Procedure: CARDIOVERSION;  Surgeon: Devorah Fonder, MD;  Location: [facility] ORS;  Service: Cardiovascular;  Laterality: N/A;   CARDIOVERSION N/A 03/27/2018   Procedure: CARDIOVERSION;  Surgeon: Devorah Fonder, MD;  Location: [facility] ORS;  Service: Cardiovascular;  Laterality: N/A;   CATARACT EXTRACTION W/ INTRAOCULAR LENS  IMPLANT, BILATERAL  01/2006-02-2006   CHOLECYSTECTOMY N/A 11/11/2012   Procedure: LAPAROSCOPIC CHOLECYSTECTOMY WITH INTRAOPERATIVE  CHOLANGIOGRAM;  Surgeon: Kari Otto. Eli Grizzle, MD;  Location: [facility] OR;  Service: General;  Laterality: N/A;   COLONOSCOPY WITH PROPOFOL  N/A 02/28/2019   Procedure: COLONOSCOPY WITH PROPOFOL ;  Surgeon: Janel Medford, MD;  Location: [facility] ENDOSCOPY;  Service: Endoscopy;  Laterality: N/A;   ENTEROSCOPY N/A 03/30/2020   Procedure: ENTEROSCOPY;  Surgeon:  Cirigliano, Laquetta Plank, DO;  Location: [facility] ENDOSCOPY;  Service: Gastroenterology;  Laterality: N/A;   ENTEROSCOPY N/A 02/10/2021   Procedure: ENTEROSCOPY;  Surgeon: Alvis Jourdain, MD;  Location: [facility] ENDOSCOPY;  Service: Endoscopy;  Laterality: N/A;   ENTEROSCOPY N/A 02/15/2022   Procedure: ENTEROSCOPY;  Surgeon: Daina Drum, MD;  Location: [facility] ENDOSCOPY;  Service: Gastroenterology;  Laterality: N/A;   ENTEROSCOPY N/A 11/06/2022   Procedure: ENTEROSCOPY;  Surgeon: Brice Campi Albino Alu., MD;  Location: [facility] ENDOSCOPY;  Service: Gastroenterology;  Laterality: N/A;   EP IMPLANTABLE DEVICE N/A 02/21/2016   Procedure: ICD Generator Changeout;  Surgeon: Verona Goodwill, MD;  Location: [facility] INVASIVE CV LAB;  Service: Cardiovascular;  Laterality: N/A;   ESOPHAGOGASTRODUODENOSCOPY N/A 03/04/2022   Procedure: ESOPHAGOGASTRODUODENOSCOPY (EGD);  Surgeon: Normie Becton., MD;  Location: [facility] ENDOSCOPY;  Service: Gastroenterology;  Laterality: N/A;   ESOPHAGOGASTRODUODENOSCOPY (EGD) WITH PROPOFOL  N/A 02/28/2019   Procedure: ESOPHAGOGASTRODUODENOSCOPY (EGD) WITH PROPOFOL ;  Surgeon: Janel Medford, MD;  Location: [facility] ENDOSCOPY;  Service: Endoscopy;  Laterality: N/A;   EUS N/A 03/04/2022   Procedure: UPPER ENDOSCOPIC ULTRASOUND (EUS) RADIAL;  Surgeon: Normie Becton., MD;  Location: [facility] ENDOSCOPY;  Service: Gastroenterology;  Laterality: N/A;   FINE NEEDLE ASPIRATION N/A 03/04/2022   Procedure: FINE NEEDLE ASPIRATION (FNA) LINEAR;  Surgeon: Normie Becton., MD;  Location: [facility] ENDOSCOPY;  Service: Gastroenterology;  Laterality: N/A;   GIVENS CAPSULE STUDY N/A  03/15/2019   Procedure: GIVENS CAPSULE STUDY;  Surgeon: Lindle Rhea, MD;  Location: [facility] ENDOSCOPY;  Service: Gastroenterology;  Laterality: N/A;   HOT HEMOSTASIS N/A 03/30/2020   Procedure: HOT HEMOSTASIS (ARGON PLASMA COAGULATION/BICAP);  Surgeon: Annis Kinder, DO;  Location: [facility] ENDOSCOPY;  Service: Gastroenterology;  Laterality: N/A;   HOT HEMOSTASIS N/A 02/10/2021   Procedure: HOT HEMOSTASIS (ARGON PLASMA COAGULATION/BICAP);  Surgeon: Alvis Jourdain, MD;  Location: [facility] ENDOSCOPY;  Service: Endoscopy;  Laterality: N/A;   HOT HEMOSTASIS N/A 02/15/2022   Procedure: HOT HEMOSTASIS (ARGON PLASMA COAGULATION/BICAP);  Surgeon: Daina Drum, MD;  Location: [facility] ENDOSCOPY;  Service: Gastroenterology;  Laterality: N/A;   HOT HEMOSTASIS N/A 11/06/2022   Procedure: HOT HEMOSTASIS (ARGON PLASMA COAGULATION/BICAP);  Surgeon: Normie Becton., MD;  Location: [name] ENDOSCOPY;  Service: Gastroenterology;  Laterality: N/A;   INSERT / REPLACE / REMOVE PACEMAKER  05-01-11   02-28-05-/05-04-10-ICD-MEDTRONIC MAXIMAL DR   IR ANGIOGRAM SELECTIVE EACH ADDITIONAL VESSEL  02/18/2022   IR ANGIOGRAM SELECTIVE EACH ADDITIONAL VESSEL  02/18/2022   IR ANGIOGRAM VISCERAL SELECTIVE  02/16/2022   IR ANGIOGRAM VISCERAL SELECTIVE  02/16/2022   IR EMBO ART  VEN HEMORR LYMPH EXTRAV  INC GUIDE ROADMAPPING  02/16/2022   IR THORACENTESIS ASP PLEURAL SPACE W/IMG GUIDE  02/21/2022   IR THORACENTESIS ASP PLEURAL SPACE W/IMG GUIDE  02/22/2022   IR US  GUIDE VASC ACCESS RIGHT  02/16/2022   JOINT REPLACEMENT     LAPAROSCOPIC CHOLECYSTECTOMY  11/11/2012   LAPAROSCOPIC LYSIS OF ADHESIONS N/A 11/11/2012   Procedure: LAPAROSCOPIC LYSIS OF ADHESIONS;  Surgeon: Kari Otto. Eli Grizzle, MD;  Location: [facility] OR;  Service: General;  Laterality: N/A;   NEPHRECTOMY Right 06/2001    S/P RENAL CELL CANCER   PERIPHERAL VASCULAR INTERVENTION Bilateral 02/12/2021   Procedure: PERIPHERAL VASCULAR INTERVENTION;  Surgeon: Adine Hoof, MD;   Location: [facility] INVASIVE CV LAB;  Service: Cardiovascular;  Laterality: Bilateral;  Iliac artery stents   PRESSURE SENSOR/CARDIOMEMS N/A 02/03/2019   Procedure: PRESSURE SENSOR/CARDIOMEMS;  Surgeon: Darlis Eisenmenger, MD;  Location: [facility] INVASIVE CV LAB;  Service: Cardiovascular;  Laterality: N/A;   RIGHT HEART CATH N/A 11/09/2018   Procedure: RIGHT HEART CATH;  Surgeon: Darlis Eisenmenger, MD;  Location: [facility] INVASIVE CV LAB;  Service: Cardiovascular;  Laterality: N/A;   RIGHT HEART CATH N/A 03/08/2019   Procedure: RIGHT HEART CATH;  Surgeon: Darlis Eisenmenger, MD;  Location: [facility] INVASIVE CV LAB;  Service: Cardiovascular;  Laterality: N/A;   SUBMUCOSAL TATTOO INJECTION  02/28/2019   Procedure: SUBMUCOSAL TATTOO INJECTION;  Surgeon: Janel Medford, MD;  Location: [facility] ENDOSCOPY;  Service: Endoscopy;;   SUBMUCOSAL TATTOO INJECTION  11/06/2022   Procedure: SUBMUCOSAL TATTOO INJECTION;  Surgeon: Normie Becton., MD;  Location: [facility] ENDOSCOPY;  Service: Gastroenterology;;   TOTAL HIP ARTHROPLASTY Right 05/03/2011   Procedure: TOTAL HIP ARTHROPLASTY ANTERIOR APPROACH;  Surgeon: Arnie Lao;  Location: [facility] ORS;  Service: Orthopedics;  Laterality: Right;  Removal of Cannulated Screws Right Hip, Right Direct Anterior Hip Replacement   TOTAL HIP ARTHROPLASTY Left 10/09/2018   Procedure: TOTAL HIP ARTHROPLASTY ANTERIOR APPROACH;  Surgeon: Adonica Hoose, MD;  Location: [facility] OR;  Service: Orthopedics;  Laterality: Left;    Family History: Family History  Problem Relation Age of Onset   Heart failure Mother    Early death Father        car accident   Breast cancer Maternal Aunt 85   Breast cancer Cousin    Breast cancer Other    Colon cancer Neg Hx    Esophageal cancer Neg Hx    Liver disease Neg Hx    Pancreatic cancer Neg Hx    Rectal cancer Neg Hx     Social History: Social History   Socioeconomic History   Marital status: Widowed    Spouse name: Not on file   Number of children: 2   Years  of education: high school   Highest education level: Not on file  Occupational History   Occupation: Retired    Associate Professor: RETIRED  Tobacco Use   Smoking status: Former    Current packs/day: 0.00    Average packs/day: 0.5 packs/day for 40.0 years (20.0 ttl pk-yrs)    Types: Cigarettes    Start date: 05/06/1961    Quit date: 05/06/2001    Years since quitting: 22.3   Smokeless tobacco: Never  Vaping Use   Vaping status: Never Used  Substance and Sexual Activity   Alcohol use: No    Alcohol/week: 0.0 standard drinks of alcohol   Drug use: No   Sexual activity: Not Currently  Other Topics Concern   Not on file  Social History Narrative   Would desire CPR   07/11/20   From: the area   Living: with great-grandson - Herminia Lope (2003)   Work: retired - clerical      Family: 2 children - Amalia Badder and [place] - 2 grand chlidren - 5 great grand children      Enjoys: spending time with friends - watch move, eat out, spend time      Exercise: walking around the house   Diet: not good, limits red meat, limits salt, low appetite      Safety   Seat belts: Yes    Guns: Yes  and secure   Safe in relationships: Yes    Social Drivers of Health   Financial Resource Strain: Low Risk  (05/28/2022)   Overall Financial Resource Strain (CARDIA)    Difficulty of Paying Living Expenses: Not hard at all  Food Insecurity: No Food Insecurity (09/03/2023)   Hunger Vital Sign  Worried About Programme researcher, broadcasting/film/video in the Last Year: Never true    Ran Out of Food in the Last Year: Never true  Transportation Needs: No Transportation Needs (09/03/2023)   PRAPARE - Administrator, Civil Service (Medical): No    Lack of Transportation (Non-Medical): No  Physical Activity: Inactive (09/24/2021)   Exercise Vital Sign    Days of Exercise per Week: 0 days    Minutes of Exercise per Session: 0 min  Stress: No Stress Concern Present (09/24/2021)   [employer] of Occupational Health - Occupational Stress  Questionnaire    Feeling of Stress : Not at all  Social Connections: Socially Isolated (09/03/2023)   Social Connection and Isolation Panel [NHANES]    Frequency of Communication with Friends and Family: More than three times a week    Frequency of Social Gatherings with Friends and Family: More than three times a week    Attends Religious Services: Never    Database administrator or Organizations: No    Attends Banker Meetings: Never    Marital Status: Widowed    Allergies:  Allergies  Allergen Reactions   Codeine Nausea And Vomiting and Other (See Comments)    Hallucinations, too- CANNOT HAVE; thinks she may have received Narcan  to reverse the effect   Dilaudid  [Hydromorphone ] Other (See Comments)    Excessive Somnolence- Required Narcan  X2   Tape Other (See Comments)    CAN TOLERATE ONLY EASY-RELEASE, PAPER TAPE, AS THE SKIN IS VERY THIN AND WILL BRUISE AND TEAR VERY EASILY   Amoxil  [Amoxicillin ] Nausea Only   Asa [Aspirin ] Other (See Comments)    Told to avoid due to reduced kidney function, has 1 remaining kidney   Ms Contin  [Morphine ] Nausea And Vomiting and Other (See Comments)    Hallucinations, also   Neurontin  [Gabapentin ] Other (See Comments)    "Out of it"   Nsaids Other (See Comments)    Told to avoid due to reduced kidney function, has 1 remaining kidney    Objective:    Vital Signs:   Temp:  [97.3 F (36.3 C)-101.1 F (38.4 C)] 98.1 F (36.7 C) (05/01 1423) Pulse Rate:  [60-80] 60 (05/01 1423) Resp:  [16-21] 16 (05/01 1423) BP: (96-115)/(33-67) 107/51 (05/01 1423) SpO2:  [92 %-100 %] 98 % (05/01 1423) Weight:  [64.1 kg] 64.1 kg (04/30 2020) Last BM Date : 09/04/23  Weight change: Filed Weights   09/03/23 2020  Weight: 64.1 kg    Intake/Output:   Intake/Output Summary (Last 24 hours) at 09/04/2023 1642 Last data filed at 09/04/2023 1347 Gross per 24 hour  Intake 1483.1 ml  Output 300 ml  Net 1183.1 ml      Physical Exam     General:  Chronically ill-appearing HEENT: normal Neck: supple. JVP 14 with prominent CV waves. Carotids 2+ bilat; no bruits. No lymphadenopathy or thyromegaly appreciated. Cor: PMI nondisplaced. Irregular rate & rhythm. 3/6 HSM LLSB.  Lungs: clear Abdomen: soft, nontender, nondistended. No hepatosplenomegaly. No bruits or masses. Good bowel sounds. Extremities: 1+ edema to knees.  Erythema and ulceration on lower legs.  Neuro: alert & orientedx3, cranial nerves grossly intact. moves all 4 extremities w/o difficulty. Affect pleasant   Telemetry   Atrial fibrillation (personally reviewed)  EKG    Atrial fibrillation with V-pacing  Labs   Basic Metabolic Panel: Recent Labs  Lab 09/03/23 1600 09/04/23 0502  NA 134* 131*  K 3.7 4.2  CL 91* 97*  CO2 29 28  GLUCOSE 74 99  BUN 50* 51*  CREATININE 1.60* 1.89*  CALCIUM  9.1 7.9*  MG  --  2.0  PHOS  --  4.9*    Liver Function Tests: Recent Labs  Lab 09/03/23 1600  AST 21  ALT 10  ALKPHOS 61  BILITOT 1.1  PROT 7.8  ALBUMIN  3.6   Recent Labs  Lab 09/03/23 1600  LIPASE 43   No results for input(s): "AMMONIA" in the last 168 hours.  CBC: Recent Labs  Lab 09/03/23 1600 09/04/23 0502  WBC 11.2* 10.1  NEUTROABS 9.2*  --   HGB 9.7* 8.2*  HCT 33.7* 27.5*  MCV 79.9* 78.3*  PLT 273 212    Cardiac Enzymes: No results for input(s): "CKTOTAL", "CKMB", "CKMBINDEX", "TROPONINI" in the last 168 hours.  BNP: BNP (last 3 results) Recent Labs    06/13/23 1552 06/24/23 1621 07/10/23 1641  BNP 349.6* 516.7* 322.2*    ProBNP (last 3 results) No results for input(s): "PROBNP" in the last 8760 hours.   CBG: No results for input(s): "GLUCAP" in the last 168 hours.  Coagulation Studies: Recent Labs    09/03/23 1600  LABPROT 15.7*  INR 1.2     Imaging   No results found.   Medications:     Current Medications:  ascorbic acid   500 mg Oral Daily   enoxaparin  (LOVENOX ) injection  30 mg Subcutaneous  Q24H   hydrocerin   Topical Daily   leptospermum manuka honey  1 Application Topical Daily   pantoprazole   40 mg Oral Daily   polyethylene glycol  17 g Oral BID   potassium chloride  SA  20 mEq Oral Daily   rosuvastatin   10 mg Oral Daily   sodium chloride  flush  3 mL Intravenous Q12H   sodium chloride  flush  3 mL Intravenous Q12H    Infusions:  sodium chloride      sodium chloride  75 mL/hr at 09/04/23 0912   ceFEPime  (MAXIPIME ) IV 2 g (09/04/23 1541)      Assessment/Plan   1. ID: Patient was admitted with sepsis syndrome with gram negative bacteremia, likely source being lower extremity recurrent cellulitis.  She has not required pressors, was given gentle IV fluid resuscitation. Lactate initially elevated at 3.4, now corrected to 1.2.  - Continue cefepime  pending speciation of GNR.  - Can continue gentle NS 75 cc/hr until this evening then would stop (delicate balance with volume status, has significant RV failure).  - Wound care to assess legs.  2. Chronic diastolic CHF: With prominent RV failure. She had remote nonischemic cardiomyopathy with recovery of EF, has Medtronic ICD. Atrial fibrillation likely contributes, but this appears to be permanent now as she has failed multiple anti-arrhythmics and is reasonably rate-controlled. Echo in 9/24 with LVEF 65-70%, D-shaped septum, mildly reduced RV, RVSP 103 mmHg, biatrial enlargement, severe TR, mild AS with mean gradient of 17 mmhg, moderate mitral stenosis with mean gradient of 6 mmHg, dilated IVC. She has been RV pacing at a high percentage, but LV EF has remained normal. Management has been complicated by cardiorenal syndrome, and she has been on high doses of diuretics at home.  Currently off diuretics with sepsis syndrome.  On exam, she has JVD but interpretation is complicated by her severe TR.  - Would stop IVF this evening => her creatinine is at baseline and lactate is normal.  - She will need to restart her home diuretics soon,  possibly tomorrow, to avoid development of significant volume overload  from RV failure.  - Off SGLT2i after UTIs 3. Chest pain: Remote cath with nonobstructive disease.  Her chest pain was atypical (pleuritic), now resolved.  HS-TnI 1072 then decreased. I suspect that this is most likely demand ischemia in the setting of sepsis.   - Would hold off on invasive evaluation.  - Agree with ASA 81.  - Continue statin.  - Would avoid heparin  gtt with multiple prior episodes of GI bleeding.  4. Pulmonary hypertension:  Based on RHC 11/20, she has primarily pulmonary venous hypertension, not candidate for pulmonary vasodilators.  5. CKD: Stage 4. Creatinine today is near baseline, 1.89.   6. Chronic anemia/GI bleeding: Due to small bowel AVMs. She has had transfusions and IV Fe.  - Anticoagulation has been stopped.  - She is not candidate for Watchman (too high risk for placement).  7. Atrial fibrillation: Permanent. Rate control is reasonable off nodal blockers. She has failed amiodarone , Tikosyn , and flecainide  as well as multiple cardioversions.  - Off Eliquis  with recurrent GI bleeding and transfusions.  - Not Watchman candidate due to frailty and poorly compensated CHF.  8. Neuroendocrine tumor of pancreatic head - Treating with bevacizumab .  9. Tricuspid regurgitation: Severe.  Prominent CV waves on JVP noted.  10. Mitral stenosis: Moderate on last echo.  - Follow, not surgical candidate.   Frail patient with multiple medical problems.  Would avoid procedures.  She is DNR, hospice care would be appropriate.  Patient has had discussions about this but has not wanted in past.   Length of Stay: 1  Peder Bourdon, MD  09/04/2023, 4:42 PM    Advanced Heart Failure Team Pager [phone] (M-F; 7a - 5p)  Please contact [facility] for night-coverage after hours (4p -7a ) and weekends on [email or web address]

## 2023-09-04 NOTE — Progress Notes (Signed)
 PHARMACY - PHYSICIAN COMMUNICATION CRITICAL VALUE ALERT - BLOOD CULTURE IDENTIFICATION (BCID)  [name] is an 89 y.o. female who presented to [facility] on 09/03/2023 with a chief complaint of leg swelling and wounds  Assessment: Blood culture from 4/30 with GNR,  BCID detects enterobacterales (a group of gram neg bacteria).  Source not clear but UA unrevealing for UTI,  had PMH of pancreatitic cancer but no obvious source of intra-abdominal infection per CT AP.  Wound on legs may be source.    Name of physician (or Provider) Contacted: Dr Sari Cunning and Dr Francee Inch to be contacted  Current antibiotics: vancomycin  and cefepime   Changes to prescribed antibiotics recommended:  Recommendations accepted by provider - stop vanco  Results for orders placed or performed during the hospital encounter of 09/03/23  Blood Culture ID Panel (Reflexed) (Collected: 09/03/2023  3:38 PM)  Result Value Ref Range   Enterococcus faecalis NOT DETECTED NOT DETECTED   Enterococcus Faecium NOT DETECTED NOT DETECTED   Listeria monocytogenes NOT DETECTED NOT DETECTED   Staphylococcus species NOT DETECTED NOT DETECTED   Staphylococcus aureus (BCID) NOT DETECTED NOT DETECTED   Staphylococcus epidermidis NOT DETECTED NOT DETECTED   Staphylococcus lugdunensis NOT DETECTED NOT DETECTED   Streptococcus species NOT DETECTED NOT DETECTED   Streptococcus agalactiae NOT DETECTED NOT DETECTED   Streptococcus pneumoniae NOT DETECTED NOT DETECTED   Streptococcus pyogenes NOT DETECTED NOT DETECTED   A.calcoaceticus-baumannii NOT DETECTED NOT DETECTED   Bacteroides fragilis NOT DETECTED NOT DETECTED   Enterobacterales DETECTED (A) NOT DETECTED   Enterobacter cloacae complex NOT DETECTED NOT DETECTED   Escherichia coli NOT DETECTED NOT DETECTED   Klebsiella aerogenes NOT DETECTED NOT DETECTED   Klebsiella oxytoca NOT DETECTED NOT DETECTED   Klebsiella pneumoniae NOT DETECTED NOT DETECTED   Proteus species NOT  DETECTED NOT DETECTED   Salmonella species NOT DETECTED NOT DETECTED   Serratia marcescens NOT DETECTED NOT DETECTED   Haemophilus influenzae NOT DETECTED NOT DETECTED   Neisseria meningitidis NOT DETECTED NOT DETECTED   Pseudomonas aeruginosa NOT DETECTED NOT DETECTED   Stenotrophomonas maltophilia NOT DETECTED NOT DETECTED   Candida albicans NOT DETECTED NOT DETECTED   Candida auris NOT DETECTED NOT DETECTED   Candida glabrata NOT DETECTED NOT DETECTED   Candida krusei NOT DETECTED NOT DETECTED   Candida parapsilosis NOT DETECTED NOT DETECTED   Candida tropicalis NOT DETECTED NOT DETECTED   Cryptococcus neoformans/gattii NOT DETECTED NOT DETECTED   CTX-M ESBL NOT DETECTED NOT DETECTED   Carbapenem resistance IMP NOT DETECTED NOT DETECTED   Carbapenem resistance KPC NOT DETECTED NOT DETECTED   Carbapenem resistance NDM NOT DETECTED NOT DETECTED   Carbapenem resist OXA 48 LIKE NOT DETECTED NOT DETECTED   Carbapenem resistance VIM NOT DETECTED NOT DETECTED    Valma Gazella, PharmD, BCPS, BCIDP Work Cell: [phone] 09/04/2023 9:03 AM

## 2023-09-04 NOTE — Progress Notes (Signed)
 Patient refused wound care on bilateral lower extremities. Offered pain medication prior to wound care still refused. Education provided. Patient stated she needs more time but not today with wound care. Messaged wound nurse.

## 2023-09-05 DIAGNOSIS — R7881 Bacteremia: Secondary | ICD-10-CM

## 2023-09-05 DIAGNOSIS — B9689 Other specified bacterial agents as the cause of diseases classified elsewhere: Secondary | ICD-10-CM | POA: Diagnosis not present

## 2023-09-05 DIAGNOSIS — L97929 Non-pressure chronic ulcer of unspecified part of left lower leg with unspecified severity: Secondary | ICD-10-CM | POA: Diagnosis not present

## 2023-09-05 DIAGNOSIS — L97919 Non-pressure chronic ulcer of unspecified part of right lower leg with unspecified severity: Secondary | ICD-10-CM | POA: Diagnosis not present

## 2023-09-05 HISTORY — DX: Bacteremia: R78.81

## 2023-09-05 LAB — CBC
HCT: 28.4 % — ABNORMAL LOW (ref 36.0–46.0)
Hemoglobin: 8.1 g/dL — ABNORMAL LOW (ref 12.0–15.0)
MCH: 22.8 pg — ABNORMAL LOW (ref 26.0–34.0)
MCHC: 28.5 g/dL — ABNORMAL LOW (ref 30.0–36.0)
MCV: 80 fL (ref 80.0–100.0)
Platelets: 196 10*3/uL (ref 150–400)
RBC: 3.55 MIL/uL — ABNORMAL LOW (ref 3.87–5.11)
RDW: 19.3 % — ABNORMAL HIGH (ref 11.5–15.5)
WBC: 6.1 10*3/uL (ref 4.0–10.5)
nRBC: 0 % (ref 0.0–0.2)

## 2023-09-05 LAB — BASIC METABOLIC PANEL WITH GFR
Anion gap: 7 (ref 5–15)
BUN: 52 mg/dL — ABNORMAL HIGH (ref 8–23)
CO2: 26 mmol/L (ref 22–32)
Calcium: 8.2 mg/dL — ABNORMAL LOW (ref 8.9–10.3)
Chloride: 97 mmol/L — ABNORMAL LOW (ref 98–111)
Creatinine, Ser: 1.91 mg/dL — ABNORMAL HIGH (ref 0.44–1.00)
GFR, Estimated: 25 mL/min — ABNORMAL LOW (ref >60)
Glucose, Bld: 105 mg/dL — ABNORMAL HIGH (ref 70–99)
Potassium: 4.3 mmol/L (ref 3.5–5.1)
Sodium: 130 mmol/L — ABNORMAL LOW (ref 135–145)

## 2023-09-05 LAB — MAGNESIUM: Magnesium: 2.3 mg/dL (ref 1.7–2.4)

## 2023-09-05 LAB — PHOSPHORUS: Phosphorus: 4.5 mg/dL (ref 2.5–4.6)

## 2023-09-05 LAB — OSMOLALITY: Osmolality: 293 mosm/kg (ref 275–295)

## 2023-09-05 MED ORDER — ONDANSETRON HCL 4 MG/2ML IJ SOLN
4.0000 mg | Freq: Four times a day (QID) | INTRAMUSCULAR | Status: DC | PRN
  Administered 2023-09-05 – 2023-09-07 (×3): 4 mg via INTRAVENOUS
  Filled 2023-09-05 (×3): qty 2

## 2023-09-05 MED ORDER — ASPIRIN 81 MG PO TBEC
81.0000 mg | DELAYED_RELEASE_TABLET | Freq: Every day | ORAL | Status: DC
  Administered 2023-09-05 – 2023-09-08 (×4): 81 mg via ORAL
  Filled 2023-09-05 (×4): qty 1

## 2023-09-05 MED ORDER — TORSEMIDE 20 MG PO TABS
100.0000 mg | ORAL_TABLET | Freq: Two times a day (BID) | ORAL | Status: DC
  Administered 2023-09-05 – 2023-09-07 (×6): 100 mg via ORAL
  Filled 2023-09-05 (×8): qty 5

## 2023-09-05 NOTE — Progress Notes (Signed)
 Patient ID: [name], female   DOB: [birth year], 89 y.o.   MRN: [id number]     Advanced Heart Failure Rounding Note  Cardiologist: Belva Boyden, MD  Chief Complaint: CHF Subjective:    Feels better today. BP stable, creatinine stable at 1.9.  She remains on cefepime  for GNR bacteremia.    Objective:   Weight Range: 64.1 kg Body mass index is 22.47 kg/m.   Vital Signs:   Temp:  [98 F (36.7 C)-98.7 F (37.1 C)] 98 F (36.7 C) (05/02 0802) Pulse Rate:  [60-88] 62 (05/02 0802) Resp:  [16-20] 16 (05/02 0802) BP: (107-121)/(45-57) 121/57 (05/02 0802) SpO2:  [98 %-100 %] 100 % (05/02 0802) Last BM Date : 09/04/23  Weight change: Filed Weights   09/03/23 2020  Weight: 64.1 kg    Intake/Output:   Intake/Output Summary (Last 24 hours) at 09/05/2023 1243 Last data filed at 09/05/2023 1130 Gross per 24 hour  Intake 100 ml  Output 1200 ml  Net -1100 ml      Physical Exam    General:  Well appearing. No resp difficulty HEENT: Normal Neck: Supple. JVP 14-16 cm with prominent CV waves.  No lymphadenopathy or thyromegaly appreciated. Cor: PMI nondisplaced. Irregular rate & rhythm. 2/6 HSM LLSB. Lungs: Clear Abdomen: Soft, nontender, nondistended. No hepatosplenomegaly. No bruits or masses. Good bowel sounds. Extremities: No cyanosis, clubbing, rash. 1+ edema to knees. Erythema and wounds on lower legs.  Neuro: Alert & orientedx3, cranial nerves grossly intact. moves all 4 extremities w/o difficulty. Affect pleasant   Telemetry   Atrial fibrillation (personally reviewed)  Labs    CBC Recent Labs    09/03/23 1600 09/04/23 0502 09/05/23 0303  WBC 11.2* 10.1 6.1  NEUTROABS 9.2*  --   --   HGB 9.7* 8.2* 8.1*  HCT 33.7* 27.5* 28.4*  MCV 79.9* 78.3* 80.0  PLT 273 212 196   Basic Metabolic Panel Recent Labs    62/13/08 0502 09/05/23 0303  NA 131* 130*  K 4.2 4.3  CL 97* 97*  CO2 28 26  GLUCOSE 99 105*  BUN 51* 52*  CREATININE 1.89* 1.91*  CALCIUM   7.9* 8.2*  MG 2.0 2.3  PHOS 4.9* 4.5   Liver Function Tests Recent Labs    09/03/23 1600  AST 21  ALT 10  ALKPHOS 61  BILITOT 1.1  PROT 7.8  ALBUMIN  3.6   Recent Labs    09/03/23 1600  LIPASE 43   Cardiac Enzymes No results for input(s): "CKTOTAL", "CKMB", "CKMBINDEX", "TROPONINI" in the last 72 hours.  BNP: BNP (last 3 results) Recent Labs    06/13/23 1552 06/24/23 1621 07/10/23 1641  BNP 349.6* 516.7* 322.2*    ProBNP (last 3 results) No results for input(s): "PROBNP" in the last 8760 hours.   D-Dimer Recent Labs    09/04/23 0844  DDIMER 0.30   Hemoglobin A1C No results for input(s): "HGBA1C" in the last 72 hours. Fasting Lipid Panel No results for input(s): "CHOL", "HDL", "LDLCALC", "TRIG", "CHOLHDL", "LDLDIRECT" in the last 72 hours. Thyroid  Function Tests No results for input(s): "TSH", "T4TOTAL", "T3FREE", "THYROIDAB" in the last 72 hours.  Invalid input(s): "FREET3"  Other results:   Imaging    No results found.   Medications:     Scheduled Medications:  ascorbic acid   500 mg Oral Daily   enoxaparin  (LOVENOX ) injection  30 mg Subcutaneous Q24H   hydrocerin   Topical Daily   leptospermum manuka honey  1 Application Topical Daily  pantoprazole   40 mg Oral Daily   polyethylene glycol  17 g Oral BID   rosuvastatin   10 mg Oral Daily   sodium chloride  flush  3 mL Intravenous Q12H   sodium chloride  flush  3 mL Intravenous Q12H   torsemide   100 mg Oral BID    Infusions:  ceFEPime  (MAXIPIME ) IV 2 g (09/04/23 1541)    PRN Medications: acetaminophen  **OR** acetaminophen , bisacodyl , bisacodyl , fentaNYL  (SUBLIMAZE ) injection, methocarbamol , sodium chloride  flush    Assessment/Plan   1. ID: Patient was admitted with sepsis syndrome with gram negative bacteremia, likely source being lower extremity recurrent cellulitis.  She has not required pressors, was given gentle IV fluid resuscitation. Lactate initially elevated at 3.4, corrected  to 1.2.  - Continue cefepime  pending speciation of GNR.  - She is now off IVF.  - Wound care to assess legs.  2. Chronic diastolic CHF: With prominent RV failure. She had remote nonischemic cardiomyopathy with recovery of EF, has [employer] ICD. Atrial fibrillation likely contributes, but this appears to be permanent now as she has failed multiple anti-arrhythmics and is reasonably rate-controlled. Echo in 9/24 with LVEF 65-70%, D-shaped septum, mildly reduced RV, RVSP 103 mmHg, biatrial enlargement, severe TR, mild AS with mean gradient of 17 mmhg, moderate mitral stenosis with mean gradient of 6 mmHg, dilated IVC. She has been RV pacing at a high percentage, but LV EF has remained normal. Management has been complicated by cardiorenal syndrome, and she has been on high doses of diuretics at home.  Currently off diuretics with sepsis syndrome.  On exam, she has JVD but interpretation is complicated by her severe TR. With stable BP and creatinine, think we need to restart her home torsemide .  - Restart torsemide  100 mg bid to avoid development of significant volume overload from RV failure.  - Off SGLT2i after UTIs 3. Chest pain: Remote cath with nonobstructive disease.  Her chest pain this admission was atypical (pleuritic), now resolved.  HS-TnI 1072 then decreased. I suspect that this is most likely demand ischemia in the setting of sepsis.   - Would hold off on invasive evaluation.  - Agree with ASA 81 while inpatient, think I would hold off long-term given GI bleeding (allergy says due to kidney function, ASA should be ok).  - Continue statin.  - Would avoid heparin  gtt with multiple prior episodes of GI bleeding.  4. Pulmonary hypertension:  Based on RHC 11/20, she has primarily pulmonary venous hypertension, not candidate for pulmonary vasodilators.  5. CKD: Stage 4. Creatinine today is near baseline, 1.9.   6. Chronic anemia/GI bleeding: Due to small bowel AVMs. She has had transfusions and IV  Fe.  - Anticoagulation has been stopped.  - She is not candidate for Watchman (too high risk for placement).  7. Atrial fibrillation: Permanent. Rate control is reasonable off nodal blockers. She has failed amiodarone , Tikosyn , and flecainide  as well as multiple cardioversions.  - Off Eliquis  with recurrent GI bleeding and transfusions.  - Not Watchman candidate due to frailty and poorly compensated CHF.  8. Neuroendocrine tumor of pancreatic head - Treating with bevacizumab .  9. Tricuspid regurgitation: Severe.  Prominent CV waves on JVP noted.  10. Mitral stenosis: Moderate on last echo.  - Follow, not surgical candidate.    Frail patient with multiple medical problems.  Would avoid procedures.  She is DNR, hospice care would be appropriate.  Patient has had discussions about this but has not wanted in past.     Length  of Stay: 2  Peder Bourdon, MD  09/05/2023, 12:43 PM  Advanced Heart Failure Team Pager [phone] (M-F; 7a - 5p)  Please contact [facility] for night-coverage after hours (5p -7a ) and weekends on [email or web address]

## 2023-09-05 NOTE — Progress Notes (Signed)
 Offered pt wound care treatment post pain medication  administration. Pt refused. Education provided. Pt stated she would like to ' wait a few more days when it doesn't hurt as much'.

## 2023-09-05 NOTE — Progress Notes (Signed)
 [facility] Progress Note  Patient: [name]    ZOX:[id number]  DOA: 09/03/2023     Date of Service: the patient was seen and examined on 09/05/2023  Chief Complaint  Patient presents with   Wound Infection   Brief hospital course: [name] is a 89 y.o. female with Past medical history of chronic A-fib, sinus bradycardia s/p AICD, HFpEF, moderate AS and MR, severe TR, severe PAH, PAD, carotid stenosis, COPD, chronic respiratory failure on supplemental O2 inhalation, GI bleed due to AVM, renal cell carcinoma s/p  right renal nephrectomy, pancreatic cancer, squamous carcinoma of foot, bilateral lower extremity edema and chronic home nonhealing wounds, getting wound care as an outpatient but no improvement.  Presented at [facility] ED with complaining of shaking and chills for more than half an hour and complaining of bilateral lower extremity pain and back pain. Denied any other complaints.  Assessment and Plan: Active Problems:   Atrial fibrillation, permanent (HCC)   COPD (chronic obstructive pulmonary disease) (HCC)   ICD (implantable cardioverter-defibrillator) in place   Hypertension   PAD (peripheral artery disease) (HCC)   History of nephrectomy secondary to RCC   (HFpEF) heart failure with preserved ejection fraction (HCC)   Sepsis due to cellulitis (HCC)   # GNR bacteremia From lower extremity wounds. Started on vanc/cefepime  on arrival - ID consulted, advises stop vanc for now, continue cefepime  - await speciation - TTE ordered (has ICD, may need TEE)   # Lower extremity cellulitis # Venous stasis dermatitis # SCC RLE Chronic problem. Failed outpt cefadroxil . Hospitalized earlier this year for cellulitis. No DVT seen at that time - wound care consult - cefepime  as above - ABIs ordered but patient refused   # Sepsis, severe By fever, lactic acidosis. Lactate has normalized - gentle fluids   # Chest pain # NSTEMI New on 5/1, stated it was pleuritic.  No new dyspnea. Doesn't radiate. Dimer wnl but trops markedly elevated - cardiology consulted, advises ASA 81 mg, plan is medical mgmt   # HFpEF # Pulmonary hypertension, severe # Moderate AS, MS # TR, severe # Bradycardia s/p AICD Appears compensated - hold torsemide  and metolazone  while fluid resuscitating 5/2 restarted torsemide  100 mg p.o. twice daily as per cardio  # COPD # Chronic hypoxic respiratory failure Stable on home 2 liters   # A-fib Not anticoagulated 2/2 recurrent GI bleed   # Pancreatic neuroendocrine tumor Followed by dr. Maryalice Smaller outpt   # Recurrent GI bleed 2/2 AVMs. Hgb here stable - monitor hgb  # Anemia of chronic disease Hb 9.7 on admission Monitor H&H daily    # HTN BPs soft - holding hom meds   # CKD 3/4 # History RCC s/p nephrectomy Gfr in upper 20s is at baseline - monitor   # End-of-life care Prognosis is very poor. Patient reports she is followed by palliative as outpt, declines offer to meet with them here. Continues to want to treat the treatable.    # Debility - PT/OT consults   # Isotonic hyponatremia, most likely due to nutritional deficiency and patient is on diuretics Serum osmolality 293, within normal range Monitor electrolytes daily    Body mass index is 22.47 kg/m.  Interventions:  Diet: Heart healthy diet DVT Prophylaxis: Subcutaneous Lovenox    Advance goals of care discussion: DNR-limited  Family Communication: family was not present at bedside, at the time of interview.  The pt provided permission to discuss medical plan with the family. Opportunity was given  to ask question and all questions were answered satisfactorily.   Disposition:  Pt is from SNF, admitted with sepsis, lower extremity cellulitis, found to have bacteremia and she is on on IV antibiotics, which precludes a safe discharge. Discharge to SNF, when stable, may need few days to improve.  Subjective: No significant events overnight, patient is  still complaining of pain in the bilateral feet left greater than right.  Mild chest congestion, no nasal complaints.  Physical Exam: General: NAD, lying comfortably Appear in no distress, affect appropriate Eyes: PERRLA ENT: Oral Mucosa Clear, moist  Neck: no JVD,  Cardiovascular: S1 and S2 Present, no Murmur,  Respiratory: good respiratory effort, Bilateral Air entry equal and Decreased, no Crackles, no wheezes Abdomen: Bowel Sound present, Soft and no tenderness,  Skin: no rashes Extremities: b/l LE 2+ edema, erythema and mild tenderness, LLE chronic ulcer most likely due to [facility] ca Neurologic: without any new focal findings Gait not checked due to patient safety concerns  Vitals:   09/04/23 1423 09/04/23 2058 09/05/23 0322 09/05/23 0802  BP: (!) 107/51 (!) 114/47 (!) 111/45 (!) 121/57  Pulse: 60 64 88 62  Resp: 16 17 20 16   Temp: 98.1 F (36.7 C) 98.1 F (36.7 C) 98.7 F (37.1 C) 98 F (36.7 C)  TempSrc:  Oral    SpO2: 98% 100% 98% 100%  Weight:      Height:        Intake/Output Summary (Last 24 hours) at 09/05/2023 1252 Last data filed at 09/05/2023 1130 Gross per 24 hour  Intake 100 ml  Output 1200 ml  Net -1100 ml   Filed Weights   09/03/23 2020  Weight: 64.1 kg    Data Reviewed: I have personally reviewed and interpreted daily labs, tele strips, imagings as discussed above. I reviewed all nursing notes, pharmacy notes, vitals, pertinent old records I have discussed plan of care as described above with RN and patient/family.  CBC: Recent Labs  Lab 09/03/23 1600 09/04/23 0502 09/05/23 0303  WBC 11.2* 10.1 6.1  NEUTROABS 9.2*  --   --   HGB 9.7* 8.2* 8.1*  HCT 33.7* 27.5* 28.4*  MCV 79.9* 78.3* 80.0  PLT 273 212 196   Basic Metabolic Panel: Recent Labs  Lab 09/03/23 1600 09/04/23 0502 09/05/23 0303  NA 134* 131* 130*  K 3.7 4.2 4.3  CL 91* 97* 97*  CO2 29 28 26   GLUCOSE 74 99 105*  BUN 50* 51* 52*  CREATININE 1.60* 1.89* 1.91*  CALCIUM  9.1  7.9* 8.2*  MG  --  2.0 2.3  PHOS  --  4.9* 4.5    Studies: No results found.  Scheduled Meds:  ascorbic acid   500 mg Oral Daily   aspirin  EC  81 mg Oral Daily   enoxaparin  (LOVENOX ) injection  30 mg Subcutaneous Q24H   hydrocerin   Topical Daily   leptospermum manuka honey  1 Application Topical Daily   pantoprazole   40 mg Oral Daily   polyethylene glycol  17 g Oral BID   rosuvastatin   10 mg Oral Daily   sodium chloride  flush  3 mL Intravenous Q12H   sodium chloride  flush  3 mL Intravenous Q12H   torsemide   100 mg Oral BID   Continuous Infusions:  ceFEPime  (MAXIPIME ) IV 2 g (09/04/23 1541)   PRN Meds: acetaminophen  **OR** acetaminophen , bisacodyl , bisacodyl , fentaNYL  (SUBLIMAZE ) injection, methocarbamol , sodium chloride  flush  Time spent: 35 minutes  Author: Althia Atlas. MD [facility] 09/05/2023 12:52 PM  To reach On-call, see care teams to locate the attending and reach out to them via [email or web address]. If 7PM-7AM, please contact night-coverage If you still have difficulty reaching the attending provider, please page the [facility] (Director on Call) for [facility] on amion for assistance.

## 2023-09-05 NOTE — Evaluation (Signed)
 Occupational Therapy Evaluation Patient Details Name: [name] MRN: [id number] DOB: 06/12/1933 Today's Date: 09/05/2023   History of Present Illness   Pt is an 89 y/o F admitted on 09/03/23 after presenting with c/o shaking & chills, BLE pain, & back pain. Pt is being treated for GNR bacteremia 2/2 BLE wounds, LE cellulitis, venous stasis dermatitis, severe sepsis. PMH: chronic a-fib, sinus bradycardia s/p AICD, HFpEF, moderate AS & MR, severe TR, severe PAH, PAD, carotid stenosis, COPD, chronic respiratory failure on supplemental O2, GI bleed 2/2 AVM, renal cell carcinoma s/p R renal nephrectomy, pancreatic CA, squamous carcinoma of foot, BLE edema, chronic nonhealing wound     Clinical Impressions Pt was seen for OT evaluation this date. PTA, pt was living at home with her grandson who works 24 hour shifts. She reports family is in checking on her consistently. She was MOD I with SPC use for mobility and MOD I with ADLs, sometimes sponge bathing d/t BLE wounds. Pt still cooking some.   Pt presents to acute OT demonstrating impaired ADL performance and functional mobility 2/2 weakness, pain and low activity tolerance. Pt currently requires Min A for BLE management to reach EOB. Increased time for performance of all tasks d/t BLE pain. Able to don slippers with set up seated EOB. She required CGA for STS from EOB to RW. She ambulated ~10 feet in room to bathroom and back to perform standing grooming tasks at sink with CGA. Sp02 remained stable throughout session. Pt did c/o minimal dizziness, but did not affect her balance/performance during session. Handoff to PT for further mobility.  Pt would benefit from skilled OT services to address noted impairments and functional limitations to maximize safety and independence while minimizing falls risk and caregiver burden. Do anticipate the need for follow up OT services upon acute hospital DC.      If plan is discharge home, recommend the  following:   A little help with walking and/or transfers;A little help with bathing/dressing/bathroom;Help with stairs or ramp for entrance     Functional Status Assessment   Patient has had a recent decline in their functional status and demonstrates the ability to make significant improvements in function in a reasonable and predictable amount of time.     Equipment Recommendations   None recommended by OT     Recommendations for Other Services         Precautions/Restrictions   Precautions Precautions: Fall Restrictions Weight Bearing Restrictions Per Provider Order: No     Mobility Bed Mobility Overal bed mobility: Needs Assistance Bed Mobility: Supine to Sit     Supine to sit: Min assist, HOB elevated, Used rails     General bed mobility comments: for BLE management, increased time/effort    Transfers Overall transfer level: Needs assistance Equipment used: Rolling walker (2 wheels) Transfers: Sit to/from Stand Sit to Stand: Contact guard assist           General transfer comment: RW use, amb to sink in bathroom and back using RW with CGA      Balance Overall balance assessment: Needs assistance Sitting-balance support: Feet supported Sitting balance-Leahy Scale: Good     Standing balance support: Single extremity supported, During functional activity, Reliant on assistive device for balance Standing balance-Leahy Scale: Fair Standing balance comment: no LOB during standing grooming tasks                           ADL either performed  or assessed with clinical judgement   ADL Overall ADL's : Needs assistance/impaired     Grooming: Oral care;Brushing hair;Standing;Supervision/safety;Contact guard assist                               Functional mobility during ADLs: Contact guard assist;Rolling walker (2 wheels);Cueing for safety       Vision         Perception         Praxis         Pertinent  Vitals/Pain Pain Assessment Pain Assessment: 0-10 Pain Score: 4  Pain Location: BLEs Pain Descriptors / Indicators: Sore, Shooting, Burning Pain Intervention(s): Monitored during session, Limited activity within patient's tolerance     Extremity/Trunk Assessment Upper Extremity Assessment Upper Extremity Assessment: Overall WFL for tasks assessed   Lower Extremity Assessment Lower Extremity Assessment: Generalized weakness       Communication Communication Communication: No apparent difficulties   Cognition Arousal: Alert Behavior During Therapy: WFL for tasks assessed/performed Cognition: No apparent impairments                               Following commands: Intact       Cueing  General Comments      sp02 >90% throughout session on 2L   Exercises Other Exercises Other Exercises: Edu on role of OT in acute setting.   Shoulder Instructions      Home Living Family/patient expects to be discharged to:: Private residence Living Arrangements: Other relatives (great grandson 35 year old IT sales professional) Available Help at Discharge: Family;Available PRN/intermittently Type of Home: House Home Access: Stairs to enter [employer] of Steps: 2 on back Entrance Stairs-Rails: Right;Left;Can reach both Home Layout: One level     Bathroom Shower/Tub: Chief Strategy Officer: Handicapped height     Home Equipment: Pharmacist, hospital (2 wheels);Cane - single point;Grab bars - tub/shower;BSC/3in1   Additional Comments: great grandson works 24 hours shifts but is off several days in a row. she has other family that checks in also. He is Air cabin crew and EMT      Prior Functioning/Environment Prior Level of Function : Independent/Modified Independent             Mobility Comments: baseline 2-3L at home, uses [facility] or broom she says; says she hates the rollator ADLs Comments: Mod I with bathing, dressing, toileting with difficulty,  sponge bathes sometimes, but does get in tub/shower at times; family assist with transporation. pt still cooks some    OT Problem List: Decreased strength;Pain;Decreased activity tolerance   OT Treatment/Interventions: Self-care/ADL training;Balance training;Therapeutic exercise;Therapeutic activities;Patient/family education;Energy conservation      OT Goals(Current goals can be found in the care plan section)   Acute Rehab OT Goals Patient Stated Goal: return home OT Goal Formulation: With patient Time For Goal Achievement: 09/19/23 Potential to Achieve Goals: Good ADL Goals Pt Will Perform Lower Body Bathing: with set-up;sitting/lateral leans;sit to/from stand Pt Will Perform Lower Body Dressing: with set-up;sit to/from stand;sitting/lateral leans Pt Will Transfer to Toilet: with supervision;regular height toilet;ambulating Pt Will Perform Toileting - Clothing Manipulation and hygiene: with modified independence;sit to/from stand;sitting/lateral leans;with supervision   OT Frequency:  Min 2X/week    Co-evaluation              AM-PAC OT "6 Clicks" Daily Activity     Outcome Measure Help from another person  eating meals?: None Help from another person taking care of personal grooming?: None Help from another person toileting, which includes using toliet, bedpan, or urinal?: A Little Help from another person bathing (including washing, rinsing, drying)?: A Little Help from another person to put on and taking off regular upper body clothing?: None Help from another person to put on and taking off regular lower body clothing?: A Little 6 Click Score: 21   End of Session Equipment Utilized During Treatment: Rolling walker (2 wheels);Oxygen   Activity Tolerance: Patient tolerated treatment well Patient left:  (handoff to PT)  OT Visit Diagnosis: Other abnormalities of gait and mobility (R26.89);Muscle weakness (generalized) (M62.81);Pain Pain - part of body: Leg                 Time: 4782-9562 OT Time Calculation (min): 33 min Charges:  OT General Charges $OT Visit: 1 Visit OT Evaluation $OT Eval Moderate Complexity: 1 Mod OT Treatments $Self Care/Home Management : 8-22 mins  Essam Lowdermilk, OTR/L 09/05/23, 12:57 PM  Shanea Karney E Trask Vosler 09/05/2023, 12:47 PM

## 2023-09-05 NOTE — Plan of Care (Signed)
  Problem: Education: Goal: Knowledge of General Education information will improve Description: Including pain rating scale, medication(s)/side effects and non-pharmacologic comfort measures Outcome: Progressing   Problem: Health Behavior/Discharge Planning: Goal: Ability to manage health-related needs will improve Outcome: Progressing   Problem: Clinical Measurements: Goal: Ability to maintain clinical measurements within normal limits will improve Outcome: Progressing Goal: Cardiovascular complication will be avoided Outcome: Progressing   Problem: Activity: Goal: Risk for activity intolerance will decrease Outcome: Progressing   Problem: Nutrition: Goal: Adequate nutrition will be maintained Outcome: Progressing   Problem: Coping: Goal: Level of anxiety will decrease Outcome: Progressing   Problem: Elimination: Goal: Will not experience complications related to bowel motility Outcome: Progressing Goal: Will not experience complications related to urinary retention Outcome: Progressing   Problem: Pain Managment: Goal: General experience of comfort will improve and/or be controlled Outcome: Progressing

## 2023-09-05 NOTE — Assessment & Plan Note (Deleted)
 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   -Outpatient EUS 03/04/2022 by Dr. 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 Freida Busman did not recommend surgery in multifocal NET and also due to her age and co-morbidities. -She began first line sandostatin injections q28 days on 03/27/22.  -Dotatate PET scan, done 2 weeks after first injection 04/10/22, shows no significant tracer avidity in the pancrease or distant sites. Findings are likely due to close timing relative to the sandostatin injection blocking receptors. When we repeat this for restaging next year, will arrange 4 weeks after sandostatin -She has persistent anemia, likely secondary to GI bleeding from the tumor and AVM, she has had multiple hospital admission for anemia and bleeding.

## 2023-09-05 NOTE — Progress Notes (Signed)
 Patient refused wound care on Bilateral lower extremities. Educated patient. Offered pain medication prior to wound care but still denied.

## 2023-09-05 NOTE — Progress Notes (Signed)
 Date of Admission:  09/03/2023      ID: [name] is a 89 y.o. female  Principal Problem:   Bacteremia Active Problems:   Atrial fibrillation, permanent (HCC)   COPD (chronic obstructive pulmonary disease) (HCC)   ICD (implantable cardioverter-defibrillator) in place   Hypertension   PAD (peripheral artery disease) (HCC)   History of nephrectomy secondary to RCC   (HFpEF) heart failure with preserved ejection fraction (HCC)   Sepsis due to cellulitis (HCC)   Ulcers of both lower legs (HCC)    Subjective: Feeling tired No energy  Medications:   ascorbic acid   500 mg Oral Daily   enoxaparin  (LOVENOX ) injection  30 mg Subcutaneous Q24H   hydrocerin   Topical Daily   leptospermum manuka honey  1 Application Topical Daily   pantoprazole   40 mg Oral Daily   polyethylene glycol  17 g Oral BID   rosuvastatin   10 mg Oral Daily   sodium chloride  flush  3 mL Intravenous Q12H   sodium chloride  flush  3 mL Intravenous Q12H   torsemide   100 mg Oral BID    Objective: Vital signs in last 24 hours: Patient Vitals for the past 24 hrs:  BP Temp Temp src Pulse Resp SpO2  09/05/23 0802 (!) 121/57 98 F (36.7 C) -- 62 16 100 %  09/05/23 0322 (!) 111/45 98.7 F (37.1 C) -- 88 20 98 %  09/04/23 2058 (!) 114/47 98.1 F (36.7 C) Oral 64 17 100 %  09/04/23 1423 (!) 107/51 98.1 F (36.7 C) -- 60 16 98 %       PHYSICAL EXAM:  General: cooperative, no distress, appears stated age.chronically ill  Head: Normocephalic, without obvious abnormality, atraumatic. Eyes: Conjunctivae clear, anicteric sclerae. Pupils are equal ENT Nares normal. No drainage or sinus tenderness. Lips, mucosa, and tongue normal. No Thrush Neck: Supple, symmetrical, no adenopathy, thyroid : non tender no carotid bruit and no JVD. Back: No CVA tenderness. Lungs: Clear to auscultation bilaterally. No Wheezing or Rhonchi. No rales. Heart: Regular rate and rhythm, no murmur, rub or gallop. Abdomen: Soft,  non-tender,not distended. Bowel sounds normal. No masses Extremities:       Skin: No rashes or lesions. Or bruising Lymph: Cervical, supraclavicular normal. Neurologic: Grossly non-focal  Lab Results    Latest Ref Rng & Units 09/05/2023    3:03 AM 09/04/2023    5:02 AM 09/03/2023    4:00 PM  CBC  WBC 4.0 - 10.5 K/uL 6.1  10.1  11.2   Hemoglobin 12.0 - 15.0 g/dL 8.1  8.2  9.7   Hematocrit 36.0 - 46.0 % 28.4  27.5  33.7   Platelets 150 - 400 K/uL 196  212  273        Latest Ref Rng & Units 09/05/2023    3:03 AM 09/04/2023    5:02 AM 09/03/2023    4:00 PM  CMP  Glucose 70 - 99 mg/dL 161  99  74   BUN 8 - 23 mg/dL 52  51  50   Creatinine 0.44 - 1.00 mg/dL 0.96  0.45  4.09   Sodium 135 - 145 mmol/L 130  131  134   Potassium 3.5 - 5.1 mmol/L 4.3  4.2  3.7   Chloride 98 - 111 mmol/L 97  97  91   CO2 22 - 32 mmol/L 26  28  29    Calcium  8.9 - 10.3 mg/dL 8.2  7.9  9.1   Total Protein 6.5 -  8.1 g/dL   7.8   Total Bilirubin 0.0 - 1.2 mg/dL   1.1   Alkaline Phos 38 - 126 U/L   61   AST 15 - 41 U/L   21   ALT 0 - 44 U/L   10       Microbiology:  Studies/Results: DG Chest Port 1 View Result Date: 09/03/2023 CLINICAL DATA:  Question of sepsis to evaluate for abnormality. EXAM: PORTABLE CHEST 1 VIEW COMPARISON:  06/24/2023 FINDINGS: Cardiac pacemaker. Cardiac enlargement with pulmonary vascular congestion. Patchy perihilar infiltrates with interstitial changes in the lung bases, likely edema. Small bilateral pleural effusions are decreased since prior study. No focal consolidation. Mediastinal contours appear intact. Calcification of the aorta. Degenerative changes in the spine and shoulders. Sclerosis in the left humeral head likely represents benign bone island or possibly enchondroma. No change. IMPRESSION: Cardiac enlargement with pulmonary vascular congestion and interstitial edema. Small bilateral pleural effusions are decreased since prior study. Electronically Signed   By: Boyce Byes M.D.   On: 09/03/2023 16:13   CT ABDOMEN PELVIS WO CONTRAST Result Date: 09/03/2023 CLINICAL DATA:  Acute nonlocalized abdominal pain. Bilateral leg swelling and wounds. EXAM: CT ABDOMEN AND PELVIS WITHOUT CONTRAST TECHNIQUE: Multidetector CT imaging of the abdomen and pelvis was performed following the standard protocol without IV contrast. RADIATION DOSE REDUCTION: This exam was performed according to the departmental dose-optimization program which includes automated exposure control, adjustment of the mA and/or kV according to patient size and/or use of iterative reconstruction technique. COMPARISON:  11/04/2022 FINDINGS: Lower chest: Motion artifact limits evaluation. Mild interstitial changes in the lungs may represent fibrosis or edema. Scattered pulmonary nodules. Largest is on the right, measuring 5 mm diameter. Series 4, image 4. Cardiac enlargement. Small bilateral pleural effusions with basilar atelectasis. Hepatobiliary: Liver parenchymal pattern is somewhat heterogeneous. Suggestion of focal nodules including a hypodense nodule in the dome measuring 1.6 cm and a vague nodular opacity in the inferior right lobe likely measuring around 2 cm diameter. A left lobe nodule is demonstrated in the previous study that is not well seen today without contrast. Nodular changes are better characterized on previous contrast-enhanced studies. Lesions remain indeterminate. Pancreas: Mass in the head and body of the pancreas measuring 4.2 cm diameter. This is better seen on previous contrast-enhanced study and appears grossly unchanged. No pancreatic ductal dilatation. Spleen: Normal in size without focal abnormality. Adrenals/Urinary Tract: No adrenal gland nodules. Surgical absence of the right kidney. Left kidney appears normal. No hydronephrosis, hydroureter, or stones identified. Bladder is normal. Stomach/Bowel: Stomach, small bowel, and colon are not abnormally distended. No wall thickening or  inflammatory changes. Stool throughout the colon. Appendix is not identified. Vascular/Lymphatic: Calcification of the aorta. Aortoiliac stent graft. No aneurysm. Retroperitoneal lymphadenopathy with periaortic nodes measuring up to about 10 mm diameter. This is similar to prior study. Reproductive: No pelvic mass or collections identified. Other: No free air or free fluid in the abdomen. Abdominal wall musculature appears intact. Musculoskeletal: Bilateral total hip replacements. Compression fracture of L2, unchanged since prior study. Degenerative changes in the spine. IMPRESSION: 1. 4.2 cm diameter mass in the pancreatic head/body appears grossly unchanged since prior study. 2. Indeterminate hypodense liver nodules appear unchanged. 3. Small bilateral pleural effusions with basilar atelectasis and interstitial changes. Cardiac enlargement. 4. Multiple pulmonary nodules. Most significant: Right solid pulmonary nodule measuring 5 mm. In the setting of probable cancer, these lesions are indeterminate. Follow-up as per protocol. 5. Aortic atherosclerosis. 6. Nonspecific retroperitoneal lymphadenopathy with  lymph nodes measuring up to 10 mm diameter. 7. Surgical absence of the right kidney. Electronically Signed   By: Boyce Byes M.D.   On: 09/03/2023 16:12   CT L-SPINE NO CHARGE Result Date: 09/03/2023 CLINICAL DATA:  Abdominal pain, bilateral leg swelling and wounds EXAM: CT LUMBAR SPINE WITHOUT CONTRAST TECHNIQUE: Multidetector CT imaging of the lumbar spine was performed without intravenous contrast administration. Multiplanar CT image reconstructions were also generated. RADIATION DOSE REDUCTION: This exam was performed according to the departmental dose-optimization program which includes automated exposure control, adjustment of the mA and/or kV according to patient size and/or use of iterative reconstruction technique. COMPARISON:  11/04/2022 FINDINGS: Segmentation: 5 lumbar type vertebrae. Alignment:  Stable kyphosis centered at a chronic L2 compression fracture. Otherwise alignment is anatomic. Vertebrae: Bones are severely osteopenic. Chronic L2 compression deformity with greater than 75% loss of height and 8 mm of retropulsion, unchanged since previous exam. No acute displaced fractures. No destructive bony lesions. Paraspinal and other soft tissues: The paraspinal soft tissues are unremarkable. Atherosclerosis of the aorta, with bilateral iliac stents noted. Trace bilateral pleural effusions. Please refer to separately reported CT abdomen and pelvis exam for findings in that region. Disc levels: There is mild-to-moderate central canal stenosis at L1-2 and L2-3 related to the prior L2 compression fracture, stable. At L3-4 there is circumferential disc bulge with bilateral facet and ligamentum flavum hypertrophy, resulting in severe central canal stenosis and symmetrical bilateral neural foraminal encroachment. At L4-5 there is partial bony fusion across the disc space again noted. Mild circumferential disc osteophyte complex and bilateral facet hypertrophy contribute to mild central canal stenosis. At L5-S1 there is circumferential disc bulge with bilateral facet and ligamentum flavum hypertrophy resulting in moderate central canal stenosis. Reconstructed images demonstrate no additional findings. IMPRESSION: 1. No acute lumbar spine fracture. 2. Stable chronic L2 compression deformity with greater than 75% loss of height. 3. Severe osteopenia. 4. Multilevel lumbar degenerative changes as above, with central canal stenosis most pronounced at L3-4 and L5-S1. 5. Please refer to separately reported CT abdomen and pelvis exam for findings in that region. Electronically Signed   By: Bobbye Burrow M.D.   On: 09/03/2023 16:08     Assessment/Plan: Morganella bacteremia source leg wounds- continue cefepime  Will repeat blood cultures  CKD   Chronic venous edema with ulceration H/o SCC rt leg- need to r/o  left leg wound transformation. Recommend surgical opinion for biopsy   Chronic diastolic CHF    Neuroendocrine tumor of the pancreas diagnosed in 2023 and is on Sandostatin  every 28 days   Anemia secondary to the above and also has had angiodysplasia in the jejunum and has received argon laser therapy She gets iron  infusion as well as PRBC as needed    CKD   Single kidney:Rt nephrectomy for RCC     A-fib not on Eliquis  because of the GI bleed   AICD   COPD   B/l THA Discussed the management with the patient.  ID will not routinely see her this weekend.  On-call ID available by phone for urgent issues.  Call if needed.

## 2023-09-05 NOTE — Progress Notes (Signed)
 [facility] Liaison Note  09/05/2023  [name] [birth year] [id number]  Location: RN Hospital Liaison screened the patient remotely at [facility].  Insurance: [employer]   [name] is a 89 y.o. female who is a Primary Care Patient of Claire Crick, MD [facility]. The patient was screened for readmission hospitalization with noted extreme risk score for unplanned readmission risk with 2 IP/1 ED in 6 months.  Patient is currently active with Care Management for chronic disease management services.  Patient has been engaged by a Presenter, broadcasting.  Our community based plan of care has focused on disease management and community resource support. Liaison will collaborate with the involved [facility]BCI care coordinator concerning pt's admission and pending discharge disposition.  Patient will receive a post hospital call and will be evaluated for assessments and disease process education.   Inpatient Transition Of Care [TOC] team member attempted to make aware that Care Management following however no team member noted to notify at this time.   [facility]I Care Management/Population Health does not replace or interfere with any arrangements made by the Inpatient Transition of Care team.   For questions contact:   Lilla Reichert, RN, BSN Hospital Liaison [place]   [facility], Population Health Office Hours MTWF  8:00 am-6:00 pm Direct Dial: [phone] [email or web address]

## 2023-09-05 NOTE — Evaluation (Signed)
 Physical Therapy Evaluation Patient Details Name: [name] MRN: [id number] DOB: Sep 14, 1933 Today's Date: 09/05/2023  History of Present Illness  Pt is an 89 y.o. female presenting to hospital 09/03/23 with c/o B LE swelling and pain as well as lower abdominal pain; pt has been seeing wound care and treated for cellulitis in L leg over the past 2 weeks without improvement.  Pt admitted with sepsis secondary to B LE cellulitis.  PMH includes chronic A-fib, sinus bradycardia s/p AICD, HFpEF, moderate AS and MR, severe TR, severe PAH, PAD, carotid stenosis, COPD, chronic respiratory failure on supplemental O2, GI bleed due to AVM, renal cell carcinoma s/p right renal nephrectomy, pancreatic cancer, squamous carcinoma of foot, bilateral lower extremity edema and chronic home nonhealing wounds, CKD, htn, avascular necrosis of hip, pancreatic CASCC of skin, B THA anterior approach.  Clinical Impression  Prior to recent medical concerns, pt reports being ambulatory with use of SPC; lives with her great grandson in 1 level home with 2 STE with railing(s).  Pt reporting dizziness during standing activities (BP 113/91 in sitting; BP 140/56 in standing; pt reports h/o occasional dizziness at home).  Currently pt is CGA with transfer from bed and SBA with ambulation 50 feet in room with RW use.  Pt's SpO2 sats 96% or greater on 2 L O2 via nasal cannula during sessions activities.  Pt would currently benefit from skilled PT to address noted impairments and functional limitations (see below for any additional details).  Upon hospital discharge, pt would benefit from ongoing therapy.     If plan is discharge home, recommend the following: A little help with walking and/or transfers;A little help with bathing/dressing/bathroom;Assistance with cooking/housework;Assist for transportation;Help with stairs or ramp for entrance   Can travel by private vehicle    Yes    Equipment Recommendations Rolling walker (2  wheels) (pt has RW at home already)  Recommendations for Other Services       Functional Status Assessment Patient has had a recent decline in their functional status and demonstrates the ability to make significant improvements in function in a reasonable and predictable amount of time.     Precautions / Restrictions Precautions Precautions: Fall Restrictions Weight Bearing Restrictions Per Provider Order: No      Mobility  Bed Mobility Overal bed mobility: Needs Assistance Bed Mobility: Sit to Supine       Sit to supine: Supervision, HOB elevated   General bed mobility comments: increased effort to bring B LE's into bed on own    Transfers Overall transfer level: Needs assistance Equipment used: Rolling walker (2 wheels) Transfers: Sit to/from Stand Sit to Stand: Contact guard assist           General transfer comment: steady transfer from bed up to RW    Ambulation/Gait Ambulation/Gait assistance: Supervision Gait Distance (Feet): 50 Feet (in room) Assistive device: Rolling walker (2 wheels) Gait Pattern/deviations: Step-through pattern, Decreased step length - right, Decreased step length - left Gait velocity: mildly decreased     General Gait Details: steady ambulation with RW use  Stairs            Wheelchair Mobility     Tilt Bed    Modified Rankin (Stroke Patients Only)       Balance Overall balance assessment: Needs assistance Sitting-balance support: No upper extremity supported, Feet supported Sitting balance-Leahy Scale: Good Sitting balance - Comments: steady reaching within BOS   Standing balance support: Bilateral upper extremity supported, During functional activity,  Reliant on assistive device for balance Standing balance-Leahy Scale: Good Standing balance comment: steady ambulating with RW use                             Pertinent Vitals/Pain Pain Assessment Pain Assessment: 0-10 Pain Score: 7  Pain  Location: BLE's Pain Descriptors / Indicators: Sore, Shooting, Burning Pain Intervention(s): Limited activity within patient's tolerance, Monitored during session, Repositioned (pt reports waiting on IV placement in order to get pain medication) HR 61-76 bpm during sessions activities.    Home Living Family/patient expects to be discharged to:: Private residence Living Arrangements: Other relatives (Great grandson (37 y.o. IT sales professional)) Available Help at Discharge: Family;Available PRN/intermittently Type of Home: House Home Access: Stairs to enter   [employer] of Steps: 2 steps with B railings from back entrance; 2 steps with L railing from front entrance   Home Layout: One level Home Equipment: Pharmacist, hospital (2 wheels);Cane - single point;Grab bars - tub/shower;BSC/3in1 Additional Comments: Pt's great grandson works 24 hours shifts but is off several days in a row Psychologist, occupational and EMT).  Pt has other family that checks in also.    Prior Function Prior Level of Function : Independent/Modified Independent             Mobility Comments: Pt uses SPC or broom (does not like the rollator); uses 2-3 L O2 chronic at home ADLs Comments: Per OT eval "Mod I with bathing, dressing, toileting with difficulty, sponge bathes sometimes, but does get in tub/shower at times; family assist with transporation. pt still cooks some"     Extremity/Trunk Assessment   Upper Extremity Assessment Upper Extremity Assessment: Overall WFL for tasks assessed    Lower Extremity Assessment Lower Extremity Assessment: Generalized weakness    Cervical / Trunk Assessment Cervical / Trunk Assessment: Normal  Communication   Communication Communication: No apparent difficulties    Cognition Arousal: Alert Behavior During Therapy: WFL for tasks assessed/performed   PT - Cognitive impairments: No apparent impairments                         Following commands: Intact        Cueing Cueing Techniques: Verbal cues     General Comments  Pt agreeable to PT session.    Exercises     Assessment/Plan    PT Assessment Patient needs continued PT services  PT Problem List Decreased strength;Decreased activity tolerance;Decreased balance;Decreased mobility;Pain       PT Treatment Interventions DME instruction;Gait training;Stair training;Functional mobility training;Therapeutic activities;Therapeutic exercise;Balance training;Patient/family education    PT Goals (Current goals can be found in the Care Plan section)  Acute Rehab PT Goals Patient Stated Goal: to improve pain and walking PT Goal Formulation: With patient Time For Goal Achievement: 09/19/23 Potential to Achieve Goals: Good    Frequency Min 2X/week     Co-evaluation               AM-PAC PT "6 Clicks" Mobility  Outcome Measure Help needed turning from your back to your side while in a flat bed without using bedrails?: None Help needed moving from lying on your back to sitting on the side of a flat bed without using bedrails?: None Help needed moving to and from a bed to a chair (including a wheelchair)?: A Little Help needed standing up from a chair using your arms (e.g., wheelchair or bedside chair)?: A Little Help needed  to walk in hospital room?: A Little Help needed climbing 3-5 steps with a railing? : A Little 6 Click Score: 20    End of Session Equipment Utilized During Treatment: Gait belt;Oxygen  (2 L via nasal cannula) Activity Tolerance: Patient tolerated treatment well Patient left: in bed;with call bell/phone within reach;with bed alarm set Nurse Communication: Mobility status;Precautions (Pt reports nurse aware of need for pain medication already) PT Visit Diagnosis: Other abnormalities of gait and mobility (R26.89);Muscle weakness (generalized) (M62.81);Pain Pain - Right/Left:  (B) Pain - part of body: Leg;Ankle and joints of foot    Time: 1610-9604 PT Time  Calculation (min) (ACUTE ONLY): 21 min   Charges:   PT Evaluation $PT Eval Low Complexity: 1 Low PT Treatments $Therapeutic Activity: 8-22 mins PT General Charges $$ ACUTE PT VISIT: 1 Visit        Amador Junes, PT 09/05/23, 5:15 PM

## 2023-09-06 ENCOUNTER — Inpatient Hospital Stay (HOSPITAL_COMMUNITY)
Admit: 2023-09-06 | Discharge: 2023-09-06 | Disposition: A | Discharge status: Home / Self Care | Attending: Obstetrics and Gynecology | Admitting: Obstetrics and Gynecology

## 2023-09-06 DIAGNOSIS — I4821 Permanent atrial fibrillation: Secondary | ICD-10-CM

## 2023-09-06 DIAGNOSIS — R7881 Bacteremia: Secondary | ICD-10-CM

## 2023-09-06 DIAGNOSIS — J449 Chronic obstructive pulmonary disease, unspecified: Secondary | ICD-10-CM

## 2023-09-06 DIAGNOSIS — I2723 Pulmonary hypertension due to lung diseases and hypoxia: Secondary | ICD-10-CM

## 2023-09-06 LAB — ECHOCARDIOGRAM COMPLETE BUBBLE STUDY
AR max vel: 0.89 cm2
AV Area VTI: 0.86 cm2
AV Area mean vel: 0.82 cm2
AV Mean grad: 13.7 mmHg
AV Peak grad: 23.8 mmHg
Ao pk vel: 2.44 m/s
Area-P 1/2: 4.19 cm2
MV M vel: 5.36 m/s
MV Peak grad: 114.7 mmHg
MV VTI: 0.9 cm2
P 1/2 time: 369 ms
S' Lateral: 2.4 cm

## 2023-09-06 LAB — BASIC METABOLIC PANEL WITH GFR
Anion gap: 12 (ref 5–15)
BUN: 58 mg/dL — ABNORMAL HIGH (ref 8–23)
CO2: 28 mmol/L (ref 22–32)
Calcium: 8.5 mg/dL — ABNORMAL LOW (ref 8.9–10.3)
Chloride: 93 mmol/L — ABNORMAL LOW (ref 98–111)
Creatinine, Ser: 1.77 mg/dL — ABNORMAL HIGH (ref 0.44–1.00)
GFR, Estimated: 27 mL/min — ABNORMAL LOW (ref >60)
Glucose, Bld: 117 mg/dL — ABNORMAL HIGH (ref 70–99)
Potassium: 3.8 mmol/L (ref 3.5–5.1)
Sodium: 133 mmol/L — ABNORMAL LOW (ref 135–145)

## 2023-09-06 LAB — CBC
HCT: 29.6 % — ABNORMAL LOW (ref 36.0–46.0)
Hemoglobin: 8.5 g/dL — ABNORMAL LOW (ref 12.0–15.0)
MCH: 22.5 pg — ABNORMAL LOW (ref 26.0–34.0)
MCHC: 28.7 g/dL — ABNORMAL LOW (ref 30.0–36.0)
MCV: 78.3 fL — ABNORMAL LOW (ref 80.0–100.0)
Platelets: 222 10*3/uL (ref 150–400)
RBC: 3.78 MIL/uL — ABNORMAL LOW (ref 3.87–5.11)
RDW: 19.3 % — ABNORMAL HIGH (ref 11.5–15.5)
WBC: 6 10*3/uL (ref 4.0–10.5)
nRBC: 0 % (ref 0.0–0.2)

## 2023-09-06 LAB — CULTURE, BLOOD (ROUTINE X 2)
Special Requests: ADEQUATE
Special Requests: ADEQUATE

## 2023-09-06 LAB — MAGNESIUM: Magnesium: 2.2 mg/dL (ref 1.7–2.4)

## 2023-09-06 LAB — PHOSPHORUS: Phosphorus: 3.8 mg/dL (ref 2.5–4.6)

## 2023-09-06 MED ORDER — ACETAMINOPHEN 325 MG PO TABS
650.0000 mg | ORAL_TABLET | Freq: Four times a day (QID) | ORAL | Status: DC | PRN
  Administered 2023-09-06 (×2): 650 mg via ORAL
  Filled 2023-09-06 (×2): qty 2

## 2023-09-06 MED ORDER — PIPERACILLIN-TAZOBACTAM 3.375 G IVPB
3.3750 g | Freq: Three times a day (TID) | INTRAVENOUS | Status: DC
  Administered 2023-09-06 – 2023-09-08 (×6): 3.375 g via INTRAVENOUS
  Filled 2023-09-06 (×7): qty 50

## 2023-09-06 MED ORDER — ACETAMINOPHEN 650 MG RE SUPP: 650.0000 mg | Freq: Four times a day (QID) | RECTAL | Status: DC | PRN

## 2023-09-06 MED ORDER — CLOTRIMAZOLE-BETAMETHASONE 1-0.05 % EX CREA
TOPICAL_CREAM | Freq: Two times a day (BID) | CUTANEOUS | Status: DC
  Filled 2023-09-06 (×3): qty 15

## 2023-09-06 NOTE — Plan of Care (Signed)
   Problem: Education: Goal: Knowledge of General Education information will improve Description: Including pain rating scale, medication(s)/side effects and non-pharmacologic comfort measures Outcome: Progressing   Problem: Activity: Goal: Risk for activity intolerance will decrease Outcome: Progressing   Problem: Nutrition: Goal: Adequate nutrition will be maintained Outcome: Progressing

## 2023-09-06 NOTE — Progress Notes (Signed)
 [facility] Progress Note  Patient: [name]    AVW:[id number]  DOA: 09/03/2023     Date of Service: the patient was seen and examined on 09/06/2023  Chief Complaint  Patient presents with   Wound Infection   Brief hospital course: [name] is a 89 y.o. female with Past medical history of chronic A-fib, sinus bradycardia s/p AICD, HFpEF, moderate AS and MR, severe TR, severe PAH, PAD, carotid stenosis, COPD, chronic respiratory failure on supplemental O2 inhalation, GI bleed due to AVM, renal cell carcinoma s/p  right renal nephrectomy, pancreatic cancer, squamous carcinoma of foot, bilateral lower extremity edema and chronic home nonhealing wounds, getting wound care as an outpatient but no improvement.  Presented at [facility] ED with complaining of shaking and chills for more than half an hour and complaining of bilateral lower extremity pain and back pain. Denied any other complaints.  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  Started on vanc/cefepime  on arrival - ID consulted, advises stop vanc for now, and continue cefepime  which was d/c'd on 5/3 due to patient being lethargic, possible cefepime  toxicity. 5/3 started Zosyn , pharmacy consulted.  Will switch to oral as per ID on Monday TTE: LVEF 60-65%, no WMA, no right ventricular failure, RV systolic function is moderately reduced severely enlarged.  Severe PAH, LA moderately dilated, RA severely dilated, moderate AS and MS, severe TR,  Small pericardial effusion, no  vegetations. Follow ID for further recommendation of antibiotics   # Lower extremity cellulitis # Venous stasis dermatitis # SCC RLE Chronic problem. Failed outpt cefadroxil . Hospitalized earlier this year for cellulitis. No DVT seen at that time - wound care consult - Continue Zosyn  as above - ABIs ordered but patient refused   # Sepsis, severe By fever, lactic acidosis. Lactate has normalized - gentle fluids   # Chest pain # NSTEMI New on 5/1, stated it was pleuritic. No new dyspnea. Doesn't radiate. Dimer wnl but trops markedly elevated - cardiology consulted, advises ASA 81 mg, plan is medical mgmt   # HFpEF # Pulmonary hypertension, severe # Moderate AS, MS # TR, severe # Bradycardia s/p AICD Appears compensated - hold torsemide  and metolazone  while fluid resuscitating 5/2 restarted torsemide  100 mg p.o. twice daily as per cardio  # COPD # Chronic hypoxic respiratory failure Stable on home 2 liters   # A-fib Not anticoagulated 2/2 recurrent GI bleed   # Pancreatic neuroendocrine tumor Followed by dr. Maryalice Smaller outpt   # Recurrent GI bleed 2/2 AVMs. Hgb here stable - monitor hgb  # Anemia of chronic disease Hb 9.7 on admission Monitor H&H daily    # HTN BPs soft - holding hom meds   # CKD 3/4 # History RCC s/p nephrectomy Gfr in upper 20s is at baseline - monitor   # End-of-life care Prognosis is very poor. Patient reports she is followed by palliative as outpt, declines offer to meet with them here. Continues to want to treat the treatable.    # Debility - PT/OT consults   # Isotonic hyponatremia, most likely due to nutritional  deficiency and patient is on diuretics Serum osmolality 293, within normal range Monitor electrolytes daily  # Dermatitis and possible fungal infection of bilateral lower extremities Started clotrimazole-betamethasone cream twice daily    Body mass index is 22.47 kg/m.  Interventions:  Diet: Heart healthy diet DVT  Prophylaxis: Subcutaneous Lovenox    Advance goals of care discussion: DNR-limited  Family Communication: family was not present at bedside, at the time of interview.  The pt provided permission to discuss medical plan with the family. Opportunity was given to ask question and all questions were answered satisfactorily.   Disposition:  Pt is from SNF, admitted with sepsis, lower extremity cellulitis, found to have bacteremia and she is on on IV antibiotics, which precludes a safe discharge. Discharge to SNF, when stable, may need few days to improve.  Subjective: No significant events overnight, patient is still complaining of off-and-on bilateral lower extremity pain, decreased appetite, feels little bit lethargic, no nasal complaints.   Physical Exam: General: NAD, lying comfortably Appear in no distress, affect appropriate Eyes: PERRLA ENT: Oral Mucosa Clear, moist  Neck: no JVD,  Cardiovascular: S1 and S2 Present, no Murmur,  Respiratory: good respiratory effort, Bilateral Air entry equal and Decreased, no Crackles, no wheezes Abdomen: Bowel Sound present, Soft and no tenderness,  Skin: no rashes Extremities: b/l LE 1-2+ edema, erythema and mild tenderness, LLE chronic ulcer most likely due to [facility] cancer Neurologic: without any new focal findings Gait not checked due to patient safety concerns  Vitals:   09/05/23 1611 09/05/23 1956 09/06/23 0439 09/06/23 0848  BP: (!) 133/47 124/63 (!) 105/52 124/65  Pulse: 65 66 63 60  Resp: 15 18  16   Temp: 97.7 F (36.5 C) 97.8 F (36.6 C) (!) 97.5 F (36.4 C) 98.1 F (36.7 C)  TempSrc:      SpO2: 93% 100% 100% 100%  Weight:      Height:        Intake/Output Summary (Last 24 hours) at 09/06/2023 1527 Last data filed at 09/06/2023 3086 Gross per 24 hour  Intake --  Output 2000 ml  Net -2000 ml   Filed Weights   09/03/23 2020  Weight: 64.1 kg    Data Reviewed: I have personally reviewed and interpreted daily labs, tele strips,  imagings as discussed above. I reviewed all nursing notes, pharmacy notes, vitals, pertinent old records I have discussed plan of care as described above with RN and patient/family.  CBC: Recent Labs  Lab 09/03/23 1600 09/04/23 0502 09/05/23 0303 09/06/23 0342  WBC 11.2* 10.1 6.1 6.0  NEUTROABS 9.2*  --   --   --   HGB 9.7* 8.2* 8.1* 8.5*  HCT 33.7* 27.5* 28.4* 29.6*  MCV 79.9* 78.3* 80.0 78.3*  PLT 273 212 196 222   Basic Metabolic Panel: Recent Labs  Lab 09/03/23 1600 09/04/23 0502 09/05/23 0303 09/06/23 0342  NA 134* 131* 130* 133*  K 3.7 4.2 4.3 3.8  CL 91* 97* 97* 93*  CO2 29 28 26 28   GLUCOSE 74 99 105* 117*  BUN 50* 51* 52* 58*  CREATININE 1.60* 1.89* 1.91* 1.77*  CALCIUM  9.1 7.9* 8.2* 8.5*  MG  --  2.0 2.3 2.2  PHOS  --  4.9* 4.5 3.8    Studies: ECHOCARDIOGRAM COMPLETE BUBBLE STUDY Result Date: 09/06/2023    ECHOCARDIOGRAM REPORT   Patient Name:   [name] Date of Exam: 09/06/2023 Medical Rec #:  [id number]         Height:  66.5 in Accession #:    [id number]        Weight:       141.3 lb Date of Birth:  Sep 12, 1933         BSA:          1.735 m Patient Age:    89 years          BP:           124/65 mmHg Patient Gender: F                 HR:           61 bpm. Exam Location:  [facility] Procedure: 2D Echo, Cardiac Doppler and Color Doppler (Both Spectral and Color            Flow Doppler were utilized during procedure). Indications:     Bacteremia  History:         Patient has prior history of Echocardiogram examinations, most                  recent 05/28/2023. CHF, Defibrillator, Pulmonary HTN and TIA,                  Arrythmias:Atrial Fibrillation and Bradycardia; Risk                  Factors:Hypertension and Dyslipidemia.  Sonographer:     Terrilee Few RCS Referring Phys:  [id number] NOAH BEDFORD FIEP Diagnosing Phys: Sheryle Donning MD IMPRESSIONS  1. Left ventricular ejection fraction, by estimation, is 60 to 65%. The left ventricle has normal function.  The left ventricle has no regional wall motion abnormalities. There is mild left ventricular hypertrophy. Left ventricular diastolic function could not be evaluated. There is the interventricular septum is flattened in systole and diastole, consistent with right ventricular pressure and volume overload.  2. Right ventricular systolic function is moderately reduced. The right ventricular size is severely enlarged. There is severely elevated pulmonary artery systolic pressure. The estimated right ventricular systolic pressure is 96.0 mmHg.  3. Left atrial size was moderately dilated.  4. Right atrial size was severely dilated.  5. A small pericardial effusion is present. Moderate pleural effusion in both left and right lateral regions.  6. The mitral valve is abnormal. Mild mitral valve regurgitation. Moderate mitral stenosis. The mean mitral valve gradient is 5.3 mmHg. Moderate mitral annular calcification.  7. Tricuspid valve regurgitation is severe.  8. The aortic valve is calcified. There is severe calcifcation of the aortic valve. There is severe thickening of the aortic valve. Aortic valve regurgitation is mild. Moderate aortic valve stenosis. Aortic valve area, by VTI measures 0.86 cm.  9. The inferior vena cava is dilated in size with <50% respiratory variability, suggesting right atrial pressure of 15 mmHg. Comparison(s): Prior images reviewed side by side. Conclusion(s)/Recommendation(s): Severe elevation in right sided pressures, with significant RV dysfunction. Unable to do bubble study as patient does not have IV access. No clear valvular vegetations noted; would typically recommend TEE if clinical concern for endocarditis, but with RVSP of 96 mmHg, she is prohibitive risk for anesthesia. FINDINGS  Left Ventricle: Left ventricular ejection fraction, by estimation, is 60 to 65%. The left ventricle has normal function. The left ventricle has no regional wall motion abnormalities. The left ventricular  internal cavity size was normal in size. There is  mild left ventricular hypertrophy. The interventricular septum is flattened in systole and diastole, consistent with right ventricular pressure and volume overload. Left ventricular  diastolic function could not be evaluated due to paced rhythm. Left ventricular diastolic function could not be evaluated. Right Ventricle: The right ventricular size is severely enlarged. Right vetricular wall thickness was not well visualized. Right ventricular systolic function is moderately reduced. There is severely elevated pulmonary artery systolic pressure. The tricuspid regurgitant velocity is 4.50 m/s, and with an assumed right atrial pressure of 15 mmHg, the estimated right ventricular systolic pressure is 96.0 mmHg. Left Atrium: Left atrial size was moderately dilated. Right Atrium: Right atrial size was severely dilated. Pericardium: A small pericardial effusion is present. Mitral Valve: The mitral valve is abnormal. Moderate mitral annular calcification. Mild mitral valve regurgitation. Moderate mitral valve stenosis. MV peak gradient, 23.1 mmHg. The mean mitral valve gradient is 5.3 mmHg. Tricuspid Valve: The tricuspid valve is grossly normal. Tricuspid valve regurgitation is severe. No evidence of tricuspid stenosis. Aortic Valve: Likely low flow-low gradient moderate aortic stenosis given stroke volume. The aortic valve is calcified. There is severe calcifcation of the aortic valve. There is severe thickening of the aortic valve. There is moderate to severe aortic valve annular calcification. Aortic valve regurgitation is mild. Aortic regurgitation PHT measures 369 msec. Moderate aortic stenosis is present. Aortic valve mean gradient measures 13.7 mmHg. Aortic valve peak gradient measures 23.8 mmHg. Aortic valve area, by VTI measures 0.86 cm. Pulmonic Valve: The pulmonic valve was grossly normal. Pulmonic valve regurgitation is mild to moderate. No evidence of pulmonic  stenosis. Aorta: The aortic root and ascending aorta are structurally normal, with no evidence of dilitation. Venous: The inferior vena cava is dilated in size with less than 50% respiratory variability, suggesting right atrial pressure of 15 mmHg. IAS/Shunts: The atrial septum is grossly normal. Additional Comments: A device lead is visualized in the right atrium. There is a moderate pleural effusion in both left and right lateral regions.  LEFT VENTRICLE PLAX 2D LVIDd:         3.80 cm   Diastology LVIDs:         2.40 cm   LV e' medial:    9.46 cm/s LV PW:         1.30 cm   LV E/e' medial:  8.7 LV IVS:        0.80 cm   LV e' lateral:   8.49 cm/s LVOT diam:     1.90 cm   LV E/e' lateral: 9.7 LV SV:         48 LV SV Index:   28 LVOT Area:     2.84 cm  RIGHT VENTRICLE             IVC RV S prime:     13.30 cm/s  IVC diam: 2.30 cm TAPSE (M-mode): 1.2 cm LEFT ATRIUM             Index        RIGHT ATRIUM           Index LA diam:        5.20 cm 3.00 cm/m   RA Area:     32.40 cm LA Vol (A2C):   71.7 ml 41.32 ml/m  RA Volume:   126.00 ml 72.62 ml/m LA Vol (A4C):   55.4 ml 31.93 ml/m LA Biplane Vol: 66.0 ml 38.04 ml/m  AORTIC VALVE                     PULMONIC VALVE AV Area (Vmax):    0.89 cm  PV Vmax:          1.17 m/s AV Area (Vmean):   0.82 cm      PV Peak grad:     5.5 mmHg AV Area (VTI):     0.86 cm      PR End Diast Vel: 7.13 msec AV Vmax:           244.00 cm/s AV Vmean:          171.333 cm/s AV VTI:            0.557 m AV Peak Grad:      23.8 mmHg AV Mean Grad:      13.7 mmHg LVOT Vmax:         76.77 cm/s LVOT Vmean:        49.267 cm/s LVOT VTI:          0.169 m LVOT/AV VTI ratio: 0.30 AI PHT:            369 msec  AORTA Ao Root diam: 2.70 cm Ao Asc diam:  2.70 cm MITRAL VALVE               TRICUSPID VALVE MV Area (PHT): 4.19 cm    TR Peak grad:   81.0 mmHg MV Area VTI:   0.90 cm    TR Vmax:        450.00 cm/s MV Peak grad:  23.1 mmHg MV Mean grad:  5.3 mmHg    SHUNTS MV Vmax:       2.40 m/s    Systemic  VTI:  0.17 m MV Vmean:      93.1 cm/s   Systemic Diam: 1.90 cm MV Decel Time: 181 msec MR Peak grad: 114.7 mmHg MR Vmax:      535.50 cm/s MV E velocity: 82.10 cm/s Sheryle Donning MD Electronically signed by Sheryle Donning MD Signature Date/Time: 09/06/2023/1:28:48 PM    Final     Scheduled Meds:  ascorbic acid   500 mg Oral Daily   aspirin  EC  81 mg Oral Daily   clotrimazole-betamethasone   Topical BID   enoxaparin  (LOVENOX ) injection  30 mg Subcutaneous Q24H   hydrocerin   Topical Daily   leptospermum manuka honey  1 Application Topical Daily   pantoprazole   40 mg Oral Daily   polyethylene glycol  17 g Oral BID   rosuvastatin   10 mg Oral Daily   sodium chloride  flush  3 mL Intravenous Q12H   sodium chloride  flush  3 mL Intravenous Q12H   torsemide   100 mg Oral BID   Continuous Infusions:  piperacillin -tazobactam (ZOSYN )  IV 3.375 g (09/06/23 1209)   PRN Meds: acetaminophen  **OR** acetaminophen , bisacodyl , bisacodyl , fentaNYL  (SUBLIMAZE ) injection, methocarbamol , ondansetron  (ZOFRAN ) IV, sodium chloride  flush  Time spent: 55 minutes  Author: Althia Atlas. MD [facility] 09/06/2023 3:27 PM  To reach On-call, see care teams to locate the attending and reach out to them via [email or web address]. If 7PM-7AM, please contact night-coverage If you still have difficulty reaching the attending provider, please page the [facility] (Director on Call) for [facility] on amion for assistance.

## 2023-09-06 NOTE — Progress Notes (Signed)
  Echocardiogram 2D Echocardiogram has been performed.  [name] 09/06/2023, 11:44 AM

## 2023-09-06 NOTE — Progress Notes (Signed)
 Rounding Note    Patient Name: [name] Date of Encounter: 09/06/2023  [facility] Cardiologist: Belva Boyden, MD   Subjective   Main concern is leg pain, was helped by pain meds earlier today. Overall breathing is poor but similar to her baseline. We reviewed her echo, prior discussion with Dr. Mitzie Anda. She understands there isn't much we can do from a cardiac perspective and accepts this.   Inpatient Medications    Scheduled Meds:  ascorbic acid   500 mg Oral Daily   aspirin  EC  81 mg Oral Daily   clotrimazole-betamethasone   Topical BID   enoxaparin  (LOVENOX ) injection  30 mg Subcutaneous Q24H   hydrocerin   Topical Daily   leptospermum manuka honey  1 Application Topical Daily   pantoprazole   40 mg Oral Daily   polyethylene glycol  17 g Oral BID   rosuvastatin   10 mg Oral Daily   sodium chloride  flush  3 mL Intravenous Q12H   sodium chloride  flush  3 mL Intravenous Q12H   torsemide   100 mg Oral BID   Continuous Infusions:  piperacillin -tazobactam (ZOSYN )  IV 3.375 g (09/06/23 1209)   PRN Meds: acetaminophen  **OR** acetaminophen , bisacodyl , bisacodyl , fentaNYL  (SUBLIMAZE ) injection, methocarbamol , ondansetron  (ZOFRAN ) IV, sodium chloride  flush   Vital Signs    Vitals:   09/05/23 1611 09/05/23 1956 09/06/23 0439 09/06/23 0848  BP: (!) 133/47 124/63 (!) 105/52 124/65  Pulse: 65 66 63 60  Resp: 15 18  16   Temp: 97.7 F (36.5 C) 97.8 F (36.6 C) (!) 97.5 F (36.4 C) 98.1 F (36.7 C)  TempSrc:      SpO2: 93% 100% 100% 100%  Weight:      Height:        Intake/Output Summary (Last 24 hours) at 09/06/2023 1628 Last data filed at 09/06/2023 0102 Gross per 24 hour  Intake --  Output 2000 ml  Net -2000 ml      09/03/2023    8:20 PM 08/26/2023    3:07 PM 08/11/2023   11:51 AM  Last 3 Weights  Weight (lbs) 141 lb 5 oz -- 139 lb 11.2 oz  Weight (kg) 64.1 kg -- 63.368 kg      Telemetry    Atrial fibrillation with V pacing - Personally  Reviewed  Physical Exam   GEN: chronically ill appearing Neck: TR waves to jaw Cardiac: largely regular, no rubs, or gallops. 3/6 systolic murmur Respiratory: Distant GI: Soft, nontender, non-distended  MS: bilateral brawny 1+ LE edema with chronic discoloration, wounds  Neuro:  Nonfocal  Psych: Normal affect   New pertinent results (labs, ECG, imaging, cardiac studies)    I personally read echo today. Preserved LVEF, likely moderate MS and moderate AS (low flow low gradient), severely elevated RVSP of 96 mmHg, RV dysfunction  Assessment & Plan    Sepsis Gram negative bacteremia -did not require pressors, management per primary team/ID -no clear evidence of endocarditis on echo (valves are abnormal at baseline). While we would typically consider TEE if there is clinical concern for endocarditis, her RVSP of 96 mmHg puts her at prohibitive risk for anesthesia  Chronic diastolic heart failure RV failure, pulmonary venous hypertension Severe TR, moderate mitral stenosis -personally read echo today. RVSP 96 mmHg, with RV dysfunction. Severe TR, moderate MS, moderate AS (low flow low gradient) -has been restarted on her home torsemide  100 mg BID. With severely elevated right sided pressures, unlikely that we will ever completely clear her LE edema  Chronic kidney disease, stage 4 -monitoring Cr with diuresis  Permanent atrial fibrillation -not on anticoagulation due to recurrent GI bleeds -not a watchman candidate  Elevated troponin -presumed 2/2 demand due to her acute illness  Dr. Charline Contras last note stated hospice would be appropriate from a cardiovascular perspective given multiple medical comorbidities. Will defer to primary team on goals of care.    Signed, Sheryle Donning, MD  09/06/2023, 4:28 PM

## 2023-09-06 NOTE — Care Management Important Message (Signed)
 Important Message  Patient Details  Name: [name] MRN: [id number] Date of Birth: April 18, 1934   Important Message Given:  Yes - Medicare IM     Anise Kerns 09/06/2023, 1:04 PM

## 2023-09-07 DIAGNOSIS — R7881 Bacteremia: Secondary | ICD-10-CM | POA: Diagnosis not present

## 2023-09-07 LAB — CBC
HCT: 28.3 % — ABNORMAL LOW (ref 36.0–46.0)
Hemoglobin: 8.2 g/dL — ABNORMAL LOW (ref 12.0–15.0)
MCH: 22.8 pg — ABNORMAL LOW (ref 26.0–34.0)
MCHC: 29 g/dL — ABNORMAL LOW (ref 30.0–36.0)
MCV: 78.6 fL — ABNORMAL LOW (ref 80.0–100.0)
Platelets: 200 10*3/uL (ref 150–400)
RBC: 3.6 MIL/uL — ABNORMAL LOW (ref 3.87–5.11)
RDW: 19.1 % — ABNORMAL HIGH (ref 11.5–15.5)
WBC: 4.7 10*3/uL (ref 4.0–10.5)
nRBC: 0 % (ref 0.0–0.2)

## 2023-09-07 LAB — BASIC METABOLIC PANEL WITH GFR
Anion gap: 13 (ref 5–15)
BUN: 55 mg/dL — ABNORMAL HIGH (ref 8–23)
CO2: 31 mmol/L (ref 22–32)
Calcium: 8.7 mg/dL — ABNORMAL LOW (ref 8.9–10.3)
Chloride: 92 mmol/L — ABNORMAL LOW (ref 98–111)
Creatinine, Ser: 1.96 mg/dL — ABNORMAL HIGH (ref 0.44–1.00)
GFR, Estimated: 24 mL/min — ABNORMAL LOW (ref >60)
Glucose, Bld: 121 mg/dL — ABNORMAL HIGH (ref 70–99)
Potassium: 3.5 mmol/L (ref 3.5–5.1)
Sodium: 136 mmol/L (ref 135–145)

## 2023-09-07 LAB — MAGNESIUM: Magnesium: 2.2 mg/dL (ref 1.7–2.4)

## 2023-09-07 NOTE — Progress Notes (Signed)
 Rounding Note    Patient Name: [name] Date of Encounter: 09/07/2023  [facility] Cardiologist: Belva Boyden, MD   Subjective   Breathing remains poor. Feels very fatigued. Does note that LE swelling slightly improved. Hoping to be able to go home in the next few days.  Inpatient Medications    Scheduled Meds:  ascorbic acid   500 mg Oral Daily   aspirin  EC  81 mg Oral Daily   clotrimazole-betamethasone   Topical BID   enoxaparin  (LOVENOX ) injection  30 mg Subcutaneous Q24H   hydrocerin   Topical Daily   leptospermum manuka honey  1 Application Topical Daily   pantoprazole   40 mg Oral Daily   polyethylene glycol  17 g Oral BID   rosuvastatin   10 mg Oral Daily   sodium chloride  flush  3 mL Intravenous Q12H   sodium chloride  flush  3 mL Intravenous Q12H   torsemide   100 mg Oral BID   Continuous Infusions:  piperacillin -tazobactam (ZOSYN )  IV 3.375 g (09/07/23 1327)   PRN Meds: acetaminophen  **OR** acetaminophen , bisacodyl , bisacodyl , fentaNYL  (SUBLIMAZE ) injection, methocarbamol , ondansetron  (ZOFRAN ) IV, sodium chloride  flush   Vital Signs    Vitals:   09/06/23 1946 09/07/23 0505 09/07/23 0802 09/07/23 0955  BP: (!) 120/50 123/69 108/87 (!) 119/50  Pulse: 60 (!) 59 (!) 59 (!) 58  Resp: (!) 24  16   Temp: 97.6 F (36.4 C) 98.2 F (36.8 C) 97.8 F (36.6 C)   TempSrc:   Oral   SpO2:  100% 100%   Weight:      Height:        Intake/Output Summary (Last 24 hours) at 09/07/2023 1335 Last data filed at 09/07/2023 1038 Gross per 24 hour  Intake 410 ml  Output 3100 ml  Net -2690 ml      09/03/2023    8:20 PM 08/26/2023    3:07 PM 08/11/2023   11:51 AM  Last 3 Weights  Weight (lbs) 141 lb 5 oz -- 139 lb 11.2 oz  Weight (kg) 64.1 kg -- 63.368 kg      Telemetry    Atrial fibrillation with V pacing - Personally Reviewed  Physical Exam   GEN: chronically ill appearing Neck: TR waves to jaw Cardiac: largely regular, no rubs, or gallops. 2-3/6  systolic murmur Respiratory: Distant GI: Soft, nontender, non-distended  MS: bilateral brawny LE edema with chronic discoloration, wounds. Does appear slightly improved compared to yesterday Neuro:  Nonfocal  Psych: Normal affect   New pertinent results (labs, ECG, imaging, cardiac studies)    I personally read echo. Preserved LVEF, likely moderate MS and moderate AS (low flow low gradient), severely elevated RVSP of 96 mmHg, RV dysfunction  Assessment & Plan    Sepsis Gram negative bacteremia -did not require pressors, management per primary team/ID -no clear evidence of endocarditis on echo (valves are abnormal at baseline). While we would typically consider TEE if there is clinical concern for endocarditis, her RVSP of 96 mmHg puts her at prohibitive risk for anesthesia  Chronic diastolic heart failure RV failure, pulmonary venous hypertension Severe TR, moderate mitral stenosis -personally read echo. RVSP 96 mmHg, with RV dysfunction. Severe TR, moderate MS, moderate AS (low flow low gradient) -has been restarted on her home torsemide  100 mg BID. With severely elevated right sided pressures, unlikely that we will ever completely clear her LE edema -Cr up slightly today, monitor  Chronic kidney disease, stage 4 -monitoring Cr with diuresis, bumped slightly today  Permanent atrial fibrillation -not on anticoagulation due to recurrent GI bleeds -not a watchman candidate  Elevated troponin -presumed 2/2 demand due to her acute illness  Has appt in HF clinic on 5/6, this will need to be rescheduled.  Dr. Charline Contras last note stated hospice would be appropriate from a cardiovascular perspective given multiple medical comorbidities. Will defer to primary team on goals of care. From a cardiovascular standpoint, there is no significant additional therapies we can offer at this time.    Signed, Sheryle Donning, MD  09/07/2023, 1:35 PM

## 2023-09-07 NOTE — Progress Notes (Signed)
 [facility] Progress Note  Patient: [name]    IHK:[id number]  DOA: 09/03/2023     Date of Service: the patient was seen and examined on 09/07/2023  Chief Complaint  Patient presents with   Wound Infection   Brief hospital course: [name] is a 89 y.o. female with Past medical history of chronic A-fib, sinus bradycardia s/p AICD, HFpEF, moderate AS and MR, severe TR, severe PAH, PAD, carotid stenosis, COPD, chronic respiratory failure on supplemental O2 inhalation, GI bleed due to AVM, renal cell carcinoma s/p  right renal nephrectomy, pancreatic cancer, squamous carcinoma of foot, bilateral lower extremity edema and chronic home nonhealing wounds, getting wound care as an outpatient but no improvement.  Presented at [facility] ED with complaining of shaking and chills for more than half an hour and complaining of bilateral lower extremity pain and back pain. Denied any other complaints.  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  Started on vanc/cefepime  on arrival - ID consulted, advises stop vanc for now, and continue cefepime  which was d/c'd on 5/3 due to patient being lethargic, possible cefepime  toxicity. 5/3 started Zosyn , pharmacy consulted.  Will switch to oral as per ID on Monday TTE: LVEF 60-65%, no WMA, no right ventricular failure, RV systolic function is moderately reduced severely enlarged.  Severe PAH, LA moderately dilated, RA severely dilated, moderate AS and MS, severe TR,  Small pericardial effusion, no  vegetations. 4/2 repeat blood cultures NGTD Follow ID for further recommendation of antibiotics   # Lower extremity cellulitis, B/L # Venous stasis dermatitis # SCC RLE Chronic problem. Failed outpt cefadroxil . Hospitalized earlier this year for cellulitis. No DVT seen at that time - wound care consult - Continue Zosyn  as above - ABIs ordered but patient refused   # Sepsis, severe By fever, lactic acidosis. Lactate has normalized - gentle fluids   # Chest pain # NSTEMI New on 5/1, stated it was pleuritic. No new dyspnea. Doesn't radiate. Dimer wnl but trops markedly elevated - cardiology consulted, advises ASA 81 mg, plan is medical mgmt   # HFpEF # Pulmonary hypertension, severe # Moderate AS, MS # TR, severe # Bradycardia s/p AICD Appears compensated - hold torsemide  and metolazone  while fluid resuscitating 5/2 restarted torsemide  100 mg p.o. twice daily as per cardio 5/4 cardiology recommended that patient is appropriate for the hospice care  # COPD # Chronic hypoxic respiratory failure Stable on home 2 liters   # A-fib Not anticoagulated 2/2 recurrent GI bleed   # Pancreatic neuroendocrine tumor Followed by dr. Maryalice Smaller outpt   # Recurrent GI bleed 2/2 AVMs. Hgb here stable - monitor hgb  # Anemia of chronic disease Hb 9.7 on admission Monitor H&H daily    # HTN BPs soft - holding hom meds   # CKD 3/4 # History RCC s/p nephrectomy Gfr in upper 20s is at baseline - monitor   # End-of-life care Prognosis is very poor. Patient reports she is followed by palliative as outpt, declines offer to meet with them here. Continues to want to treat the treatable.    #  Debility - PT/OT consults   # Isotonic hyponatremia, most likely due to nutritional deficiency and patient is on diuretics Serum osmolality 293, within normal range Monitor electrolytes daily  # Dermatitis and possible fungal infection of bilateral lower extremities Started  clotrimazole-betamethasone cream twice daily    Body mass index is 22.47 kg/m.  Interventions:  Diet: Heart healthy diet DVT Prophylaxis: Subcutaneous Lovenox    Advance goals of care discussion: DNR-limited  Family Communication: family was not present at bedside, at the time of interview.  The pt provided permission to discuss medical plan with the family. Opportunity was given to ask question and all questions were answered satisfactorily.   Disposition:  Pt is from SNF, admitted with sepsis, lower extremity cellulitis, found to have bacteremia and she is on on IV antibiotics, which precludes a safe discharge. Discharge to SNF, when stable, may need few days to improve.  Subjective: No significant events overnight, still having off-and-on pain in the bilateral lower extremities, feeling cramps, sometime it is so severe that she has to jump.  Denied any respiratory distress, no chest pain or palpitation, no any other complaints except feeling tired and deconditioned due to lying in the bed.    Physical Exam: General: NAD, lying comfortably Appear in no distress, affect appropriate Eyes: PERRLA ENT: Oral Mucosa Clear, moist  Neck: no JVD,  Cardiovascular: S1 and S2 Present, no Murmur,  Respiratory: good respiratory effort, Bilateral Air entry equal and Decreased, no Crackles, no wheezes Abdomen: Bowel Sound present, Soft and no tenderness,  Skin: no rashes Extremities: mild edema, erythema and mild tenderness, LLE chronic ulcer most likely due to [facility] cancer Neurologic: without any new focal findings Gait not checked due to patient safety concerns  Vitals:   09/06/23 1946 09/07/23 0505 09/07/23 0802 09/07/23 0955  BP: (!) 120/50 123/69 108/87 (!) 119/50  Pulse: 60 (!) 59 (!) 59 (!) 58  Resp: (!) 24  16   Temp: 97.6 F (36.4 C) 98.2 F (36.8 C) 97.8 F (36.6 C)   TempSrc:   Oral   SpO2:  100% 100%   Weight:      Height:        Intake/Output Summary (Last 24 hours) at  09/07/2023 1515 Last data filed at 09/07/2023 1038 Gross per 24 hour  Intake 410 ml  Output 3100 ml  Net -2690 ml   Filed Weights   09/03/23 2020  Weight: 64.1 kg    Data Reviewed: I have personally reviewed and interpreted daily labs, tele strips, imagings as discussed above. I reviewed all nursing notes, pharmacy notes, vitals, pertinent old records I have discussed plan of care as described above with RN and patient/family.  CBC: Recent Labs  Lab 09/03/23 1600 09/04/23 0502 09/05/23 0303 09/06/23 0342 09/07/23 0330  WBC 11.2* 10.1 6.1 6.0 4.7  NEUTROABS 9.2*  --   --   --   --   HGB 9.7* 8.2* 8.1* 8.5* 8.2*  HCT 33.7* 27.5* 28.4* 29.6* 28.3*  MCV 79.9* 78.3* 80.0 78.3* 78.6*  PLT 273 212 196 222 200   Basic Metabolic Panel: Recent Labs  Lab 09/03/23 1600 09/04/23 0502 09/05/23 0303 09/06/23 0342 09/07/23 0330  NA 134* 131* 130* 133* 136  K 3.7 4.2 4.3 3.8 3.5  CL 91* 97* 97* 93* 92*  CO2 29 28 26 28 31   GLUCOSE 74 99 105* 117* 121*  BUN 50* 51* 52* 58* 55*  CREATININE 1.60* 1.89* 1.91* 1.77* 1.96*  CALCIUM  9.1 7.9* 8.2* 8.5*  8.7*  MG  --  2.0 2.3 2.2 2.2  PHOS  --  4.9* 4.5 3.8  --     Studies: No results found.   Scheduled Meds:  ascorbic acid   500 mg Oral Daily   aspirin  EC  81 mg Oral Daily   clotrimazole-betamethasone   Topical BID   enoxaparin  (LOVENOX ) injection  30 mg Subcutaneous Q24H   hydrocerin   Topical Daily   leptospermum manuka honey  1 Application Topical Daily   pantoprazole   40 mg Oral Daily   polyethylene glycol  17 g Oral BID   rosuvastatin   10 mg Oral Daily   sodium chloride  flush  3 mL Intravenous Q12H   sodium chloride  flush  3 mL Intravenous Q12H   torsemide   100 mg Oral BID   Continuous Infusions:  piperacillin -tazobactam (ZOSYN )  IV 3.375 g (09/07/23 1327)   PRN Meds: acetaminophen  **OR** acetaminophen , bisacodyl , bisacodyl , fentaNYL  (SUBLIMAZE ) injection, methocarbamol , ondansetron  (ZOFRAN ) IV, sodium chloride   flush  Time spent: 40 minutes  Author: Althia Atlas. MD [facility] 09/07/2023 3:15 PM  To reach On-call, see care teams to locate the attending and reach out to them via [email or web address]. If 7PM-7AM, please contact night-coverage If you still have difficulty reaching the attending provider, please page the [facility] (Director on Call) for [facility] on amion for assistance.

## 2023-09-08 ENCOUNTER — Other Ambulatory Visit

## 2023-09-08 ENCOUNTER — Encounter

## 2023-09-08 ENCOUNTER — Other Ambulatory Visit: Payer: Self-pay

## 2023-09-08 ENCOUNTER — Inpatient Hospital Stay

## 2023-09-08 ENCOUNTER — Ambulatory Visit: Payer: PPO

## 2023-09-08 ENCOUNTER — Inpatient Hospital Stay: Admitting: Hematology

## 2023-09-08 ENCOUNTER — Ambulatory Visit

## 2023-09-08 ENCOUNTER — Ambulatory Visit: Admitting: Hematology

## 2023-09-08 DIAGNOSIS — D3A8 Other benign neuroendocrine tumors: Secondary | ICD-10-CM

## 2023-09-08 DIAGNOSIS — R7881 Bacteremia: Secondary | ICD-10-CM | POA: Diagnosis not present

## 2023-09-08 DIAGNOSIS — L97919 Non-pressure chronic ulcer of unspecified part of right lower leg with unspecified severity: Secondary | ICD-10-CM | POA: Diagnosis not present

## 2023-09-08 DIAGNOSIS — L97929 Non-pressure chronic ulcer of unspecified part of left lower leg with unspecified severity: Secondary | ICD-10-CM | POA: Diagnosis not present

## 2023-09-08 LAB — BASIC METABOLIC PANEL WITH GFR
Anion gap: 11 (ref 5–15)
BUN: 52 mg/dL — ABNORMAL HIGH (ref 8–23)
CO2: 30 mmol/L (ref 22–32)
Calcium: 8.4 mg/dL — ABNORMAL LOW (ref 8.9–10.3)
Chloride: 93 mmol/L — ABNORMAL LOW (ref 98–111)
Creatinine, Ser: 1.9 mg/dL — ABNORMAL HIGH (ref 0.44–1.00)
GFR, Estimated: 25 mL/min — ABNORMAL LOW (ref >60)
Glucose, Bld: 122 mg/dL — ABNORMAL HIGH (ref 70–99)
Potassium: 3.5 mmol/L (ref 3.5–5.1)
Sodium: 134 mmol/L — ABNORMAL LOW (ref 135–145)

## 2023-09-08 LAB — CBC
HCT: 27.7 % — ABNORMAL LOW (ref 36.0–46.0)
Hemoglobin: 8.1 g/dL — ABNORMAL LOW (ref 12.0–15.0)
MCH: 22.9 pg — ABNORMAL LOW (ref 26.0–34.0)
MCHC: 29.2 g/dL — ABNORMAL LOW (ref 30.0–36.0)
MCV: 78.2 fL — ABNORMAL LOW (ref 80.0–100.0)
Platelets: 198 K/uL (ref 150–400)
RBC: 3.54 MIL/uL — ABNORMAL LOW (ref 3.87–5.11)
RDW: 19 % — ABNORMAL HIGH (ref 11.5–15.5)
WBC: 4.4 K/uL (ref 4.0–10.5)
nRBC: 0 % (ref 0.0–0.2)

## 2023-09-08 MED ORDER — CIPROFLOXACIN HCL 500 MG PO TABS: 500.0000 mg | ORAL_TABLET | Freq: Every day | ORAL | 0 refills | Status: DC

## 2023-09-08 MED ORDER — CIPROFLOXACIN HCL 500 MG PO TABS
500.0000 mg | ORAL_TABLET | Freq: Every day | ORAL | 0 refills | Status: AC
  Filled 2023-09-08: qty 4, 4d supply, fill #0

## 2023-09-08 MED ORDER — CLOTRIMAZOLE-BETAMETHASONE 1-0.05 % EX CREA
TOPICAL_CREAM | Freq: Two times a day (BID) | CUTANEOUS | 0 refills | Status: DC
  Filled 2023-09-08: qty 30, 15d supply, fill #0

## 2023-09-08 MED ORDER — METHOCARBAMOL 500 MG PO TABS: 500.0000 mg | ORAL_TABLET | Freq: Three times a day (TID) | ORAL | 0 refills | Status: DC | PRN

## 2023-09-08 MED ORDER — CIPROFLOXACIN HCL 500 MG PO TABS: 500.0000 mg | ORAL_TABLET | Freq: Every day | ORAL | Status: DC

## 2023-09-08 MED ORDER — METHOCARBAMOL 500 MG PO TABS
500.0000 mg | ORAL_TABLET | Freq: Three times a day (TID) | ORAL | 0 refills | Status: DC | PRN
  Filled 2023-09-08: qty 30, 10d supply, fill #0

## 2023-09-08 MED ORDER — CLOTRIMAZOLE-BETAMETHASONE 1-0.05 % EX CREA: TOPICAL_CREAM | Freq: Two times a day (BID) | CUTANEOUS | 0 refills | Status: DC

## 2023-09-08 MED ORDER — OXYCODONE HCL 5 MG PO TABS
5.0000 mg | ORAL_TABLET | Freq: Three times a day (TID) | ORAL | 0 refills | Status: DC | PRN
  Filled 2023-09-08: qty 15, 5d supply, fill #0

## 2023-09-08 NOTE — Progress Notes (Signed)
 Reviewed discharge orders with patient. Patient acknowledged understanding. Patient discharged with personal belongings and wheeled out by staff. No distress noted in patient. Patient transported via family vehicle.

## 2023-09-08 NOTE — Plan of Care (Signed)

## 2023-09-08 NOTE — Progress Notes (Addendum)
 Patient ID: [name], female   DOB: [birth year], 89 y.o.   MRN: [id number]     Advanced Heart Failure Rounding Note  Cardiologist: Belva Boyden, MD  Chief Complaint: CHF Subjective:    BP stable, creatinine stable at 1.9.    On zosyn  for GNR bacteremia.   Feels ok this morning. Just complaining of BLE discomfort.    Objective:   Weight Range: 64.1 kg Body mass index is 22.47 kg/m.   Vital Signs:   Temp:  [97.8 F (36.6 C)-98.2 F (36.8 C)] 97.8 F (36.6 C) (05/05 0400) Pulse Rate:  [58-96] 58 (05/05 0400) Resp:  [16] 16 (05/04 1515) BP: (119-145)/(50-91) 145/61 (05/05 0400) SpO2:  [100 %] 100 % (05/05 0400) Last BM Date : 09/04/23  Weight change: Filed Weights   09/03/23 2020  Weight: 64.1 kg    Intake/Output:   Intake/Output Summary (Last 24 hours) at 09/08/2023 4098 Last data filed at 09/08/2023 0300 Gross per 24 hour  Intake --  Output 2150 ml  Net -2150 ml    Physical Exam    General:  elderly/frail appearing.  No respiratory difficulty Neck: supple. JVD ~9 with TR waves to ~15 cm.  Cor: PMI nondisplaced. Regular rate & rhythm. No rubs, gallops. 2/6 HSM. Lungs: clear, diminished bases Extremities: no cyanosis, clubbing, rash, non-pitting BLE edema with chronic BLE wounds Neuro: alert & oriented x 3. Moves all 4 extremities w/o difficulty. Affect pleasant.   Telemetry   Atrial fibrillation 60s, intt V paced (personally reviewed)  Labs    CBC Recent Labs    09/07/23 0330 09/08/23 0252  WBC 4.7 4.4  HGB 8.2* 8.1*  HCT 28.3* 27.7*  MCV 78.6* 78.2*  PLT 200 198   Basic Metabolic Panel Recent Labs    11/91/47 0342 09/07/23 0330 09/08/23 0252  NA 133* 136 134*  K 3.8 3.5 3.5  CL 93* 92* 93*  CO2 28 31 30   GLUCOSE 117* 121* 122*  BUN 58* 55* 52*  CREATININE 1.77* 1.96* 1.90*  CALCIUM  8.5* 8.7* 8.4*  MG 2.2 2.2  --   PHOS 3.8  --   --    Liver Function Tests No results for input(s): "AST", "ALT", "ALKPHOS", "BILITOT", "PROT",  "ALBUMIN " in the last 72 hours.  No results for input(s): "LIPASE", "AMYLASE" in the last 72 hours.  Cardiac Enzymes No results for input(s): "CKTOTAL", "CKMB", "CKMBINDEX", "TROPONINI" in the last 72 hours.  BNP: BNP (last 3 results) Recent Labs    06/13/23 1552 06/24/23 1621 07/10/23 1641  BNP 349.6* 516.7* 322.2*    ProBNP (last 3 results) No results for input(s): "PROBNP" in the last 8760 hours.   D-Dimer No results for input(s): "DDIMER" in the last 72 hours.  Hemoglobin A1C No results for input(s): "HGBA1C" in the last 72 hours. Fasting Lipid Panel No results for input(s): "CHOL", "HDL", "LDLCALC", "TRIG", "CHOLHDL", "LDLDIRECT" in the last 72 hours. Thyroid  Function Tests No results for input(s): "TSH", "T4TOTAL", "T3FREE", "THYROIDAB" in the last 72 hours.  Invalid input(s): "FREET3"  Other results:   Imaging    No results found.   Medications:     Scheduled Medications:  ascorbic acid   500 mg Oral Daily   aspirin  EC  81 mg Oral Daily   clotrimazole-betamethasone   Topical BID   enoxaparin  (LOVENOX ) injection  30 mg Subcutaneous Q24H   hydrocerin   Topical Daily   leptospermum manuka honey  1 Application Topical Daily   pantoprazole   40 mg Oral Daily  polyethylene glycol  17 g Oral BID   rosuvastatin   10 mg Oral Daily   sodium chloride  flush  3 mL Intravenous Q12H   sodium chloride  flush  3 mL Intravenous Q12H   torsemide   100 mg Oral BID    Infusions:  piperacillin -tazobactam (ZOSYN )  IV 3.375 g (09/08/23 0520)    PRN Medications: acetaminophen  **OR** acetaminophen , bisacodyl , bisacodyl , fentaNYL  (SUBLIMAZE ) injection, methocarbamol , ondansetron  (ZOFRAN ) IV, sodium chloride  flush    Assessment/Plan   1. ID: Patient was admitted with sepsis syndrome with gram negative bacteremia, likely source being lower extremity recurrent cellulitis.  She has not required pressors, was given gentle IV fluid resuscitation. Lactate initially elevated at  3.4, corrected to 1.2.  - Continue cefepime  pending speciation of GNR.  - She is now off IVF.  - Wound care to assess legs.   2. Chronic diastolic CHF: With prominent RV failure. She had remote nonischemic cardiomyopathy with recovery of EF, has [employer] ICD. Atrial fibrillation likely contributes, but this appears to be permanent now as she has failed multiple anti-arrhythmics and is reasonably rate-controlled. Echo in 9/24 with LVEF 65-70%, D-shaped septum, mildly reduced RV, RVSP 103 mmHg, biatrial enlargement, severe TR, mild AS with mean gradient of 17 mmhg, moderate mitral stenosis with mean gradient of 6 mmHg, dilated IVC. She has been RV pacing at a high percentage, but LV EF has remained normal. Management has been complicated by cardiorenal syndrome, and she has been on high doses of diuretics at home.  Currently off diuretics with sepsis syndrome.   - On exam, she has JVD but interpretation is complicated by her severe TR.  - Continue torsemide  100 mg bid to avoid development of significant volume overload from RV failure.  - Off SGLT2i after UTIs  3. Chest pain: Remote cath with nonobstructive disease.  Her chest pain this admission was atypical (pleuritic), now resolved.  HS-TnI 1072 then decreased. I suspect that this is most likely demand ischemia in the setting of sepsis.   - Would hold off on invasive evaluation.  - Agree with ASA 81 while inpatient, think I would hold off long-term given GI bleeding (allergy says due to kidney function, ASA should be ok).  - Continue statin.  - Would avoid heparin  gtt with multiple prior episodes of GI bleeding.   4. Pulmonary hypertension:  Based on RHC 11/20, she has primarily pulmonary venous hypertension, not candidate for pulmonary vasodilators.   5. CKD: Stage 4. Creatinine today is near baseline, 1.9.    6. Chronic anemia/GI bleeding: Due to small bowel AVMs. She has had transfusions and IV Fe.  - Anticoagulation has been stopped.  -  She is not candidate for Watchman (too high risk for placement).   7. Atrial fibrillation: Permanent. Rate control is reasonable off nodal blockers. She has failed amiodarone , Tikosyn , and flecainide  as well as multiple cardioversions.  - Off Eliquis  with recurrent GI bleeding and transfusions.  - Not Watchman candidate due to frailty and poorly compensated CHF.   8. Neuroendocrine tumor of pancreatic head - Treating with bevacizumab .   9. Tricuspid regurgitation: Severe.  Prominent CV waves on JVP noted.   10. Mitral stenosis: Moderate on last echo.  - Follow, not surgical candidate.    Frail patient with multiple medical problems.  Would avoid procedures.  She is DNR, hospice care would be appropriate.  Patient has had discussions about this but has not wanted in past.    Length of Stay: 5  Sheryl Donna,  NP  09/08/2023, 8:22 AM  Advanced Heart Failure Team Pager [phone] (M-F; 7a - 5p)  Please contact [facility] for night-coverage after hours (5p -7a ) and weekends on [email or web address]

## 2023-09-08 NOTE — Progress Notes (Signed)
 Date of Admission:  09/03/2023      ID: [name] is a 89 y.o. female  Principal Problem:   Bacteremia Active Problems:   Atrial fibrillation, permanent (HCC)   COPD (chronic obstructive pulmonary disease) (HCC)   ICD (implantable cardioverter-defibrillator) in place   Hypertension   PAD (peripheral artery disease) (HCC)   History of nephrectomy secondary to RCC   (HFpEF) heart failure with preserved ejection fraction (HCC)   Sepsis due to cellulitis (HCC)   Ulcers of both lower legs (HCC)   Gram-negative bacteremia   Pulmonary hypertension due to COPD (HCC)    Subjective: Doing better More energy No fever  Medications:   ascorbic acid   500 mg Oral Daily   aspirin  EC  81 mg Oral Daily   [START ON 09/09/2023] ciprofloxacin   500 mg Oral Q breakfast   clotrimazole-betamethasone   Topical BID   enoxaparin  (LOVENOX ) injection  30 mg Subcutaneous Q24H   hydrocerin   Topical Daily   leptospermum manuka honey  1 Application Topical Daily   pantoprazole   40 mg Oral Daily   polyethylene glycol  17 g Oral BID   rosuvastatin   10 mg Oral Daily   sodium chloride  flush  3 mL Intravenous Q12H   sodium chloride  flush  3 mL Intravenous Q12H   torsemide   100 mg Oral BID    Objective: Vital signs in last 24 hours: Patient Vitals for the past 24 hrs:  BP Temp Temp src Pulse Resp SpO2  09/08/23 1640 (!) 119/41 98.4 F (36.9 C) -- (!) 59 16 100 %  09/08/23 0937 (!) 108/52 98.1 F (36.7 C) -- 62 16 93 %  09/08/23 0933 (!) 123/105 98.2 F (36.8 C) -- (!) 56 16 (!) 73 %  09/08/23 0400 (!) 145/61 97.8 F (36.6 C) Oral (!) 58 -- 100 %  09/07/23 2116 (!) 124/91 98.2 F (36.8 C) Oral 96 -- 100 %       PHYSICAL EXAM:  General: cooperative, no distress, appears stated age.chronically ill  Lungs: Clear to auscultation bilaterally. No Wheezing or Rhonchi. No rales. Heart: Regular rate and rhythm, no murmur, rub or gallop.AICD in place Abdomen: Soft, non-tender,not distended.  Bowel sounds normal. No masses Extremities:  Wounds b/l legs Erythema and edema almost resolved Neurologic: Grossly non-focal  Lab Results    Latest Ref Rng & Units 09/08/2023    2:52 AM 09/07/2023    3:30 AM 09/06/2023    3:42 AM  CBC  WBC 4.0 - 10.5 K/uL 4.4  4.7  6.0   Hemoglobin 12.0 - 15.0 g/dL 8.1  8.2  8.5   Hematocrit 36.0 - 46.0 % 27.7  28.3  29.6   Platelets 150 - 400 K/uL 198  200  222        Latest Ref Rng & Units 09/08/2023    2:52 AM 09/07/2023    3:30 AM 09/06/2023    3:42 AM  CMP  Glucose 70 - 99 mg/dL 161  096  045   BUN 8 - 23 mg/dL 52  55  58   Creatinine 0.44 - 1.00 mg/dL 4.09  8.11  9.14   Sodium 135 - 145 mmol/L 134  136  133   Potassium 3.5 - 5.1 mmol/L 3.5  3.5  3.8   Chloride 98 - 111 mmol/L 93  92  93   CO2 22 - 32 mmol/L 30  31  28    Calcium  8.9 - 10.3 mg/dL 8.4  8.7  8.5       Microbiology: [facility]- 08/24/23 2/2 Morganella 09/05/23 BC NG Studies/Results: No results found.    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 Recommend surgical opinion for biopsy   Chronic diastolic CHF    Neuroendocrine tumor of the pancreas diagnosed in 2023 and is on Sandostatin  every 28 days   Anemia secondary to the above and also has had angiodysplasia in the jejunum and has received argon laser therapy She gets iron  infusion as well as PRBC as needed    CKD   Single kidney:Rt nephrectomy for RCC     A-fib not on Eliquis  because of the GI bleed   AICD   COPD   B/l THA  Discussed the management with patient She will follow up with wound clinic and dermatology

## 2023-09-08 NOTE — Discharge Summary (Signed)
 [facility] Discharge Summary   Patient: [name] BJY:[id number]  PCP: Claire Crick, MD  Date of admission: 09/03/2023   Date of discharge:  09/08/2023     Discharge Diagnoses:  Principal Problem:   Bacteremia Active Problems:   Atrial fibrillation, permanent (HCC)   COPD (chronic obstructive pulmonary disease) (HCC)   ICD (implantable cardioverter-defibrillator) in place   Hypertension   PAD (peripheral artery disease) (HCC)   History of nephrectomy secondary to RCC   (HFpEF) heart failure with preserved ejection fraction (HCC)   Sepsis due to cellulitis (HCC)   Ulcers of both lower legs (HCC)   Gram-negative bacteremia   Pulmonary hypertension due to COPD [facility])   Admitted From: Home Disposition:  Home with [facility]  Recommendations for Outpatient Follow-up:  Follow-up with PCP in 1 week Follow-up with cardiology in 1 week Follow with palliative care and if condition deteriorates then patient is appropriate for hospice. Follow up LABS/TEST:     Follow-up Information     [facility]. Go on 09/15/2023.   Specialty: Cardiology Why: Hospital follow-up 09/15/2023 @ 3:30PM Please bring all medications to follow-up appointment [facility], [address], Second Floor Free Valet Parking at the [employer] information: [address] [place]  [address] [phone]        Claire Crick, MD. Go on 09/12/2023.   Specialty: Family Medicine Why: Appt @ Noon w/ Dr. Vallarie Gauze (bring discharge summary w/ you to Appt) Contact information: [address] [place] [address] [phone]                Diet recommendation: Regular diet  Activity: The patient is advised to gradually reintroduce usual activities, as tolerated  Discharge Condition: stable  Code Status: DNR Limited  History of present illness: As per the H and P dictated on admission.  Hospital Course:   [name] is a 89 y.o. female with Past medical history of chronic A-fib, sinus bradycardia s/p AICD, HFpEF, moderate AS and MR, severe TR, severe PAH, PAD, carotid stenosis, COPD, chronic respiratory failure on supplemental O2 inhalation, GI bleed due to AVM, renal cell carcinoma s/p  right renal nephrectomy, pancreatic cancer, squamous carcinoma of foot, bilateral lower extremity edema and chronic home nonhealing wounds, getting wound care as an outpatient but no improvement.  Presented at [facility] ED with complaining of shaking and chills for more than half an hour and complaining of bilateral lower extremity pain and back pain. Denied any other complaints.   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  S/p Vanc/cefepime  on arrival. ID consulted, advises stop vanc for now, and continue cefepime  which was d/c'd on 5/3 due to patient being lethargic, possible cefepime  toxicity.  5/3 started Zosyn .  TTE: LVEF 60-65%, no WMA, no right ventricular failure, RV systolic function is moderately reduced severely enlarged.  Severe PAH, LA moderately dilated, RA severely dilated, moderate AS and MS, severe TR,  Small pericardial effusion, no vegetations. 4/2 repeat blood cultures NGTD D/w ID, ciprofloxacin  500 mg p.o. daily for 4 more days.   # Lower extremity cellulitis, B/L # Venous stasis dermatitis # SCC RLE Chronic problem. Failed outpt cefadroxil . Hospitalized earlier this year for cellulitis. No DVT seen at that time. wound care consulted. S/p Abx as above, ABIs ordered but patient refused   # Sepsis, severe By fever, lactic acidosis. Lactate has normalized. S/p IVF   #  Chest pain, NSTEMI New on 5/1, stated it was pleuritic. No new dyspnea. Doesn't radiate. Dimer wnl but trops markedly elevated cardiology consulted, advises ASA 81 mg, plan is medical mgmt. deseeding aspirin  discharge cardiac history of GI  bleeding   # HFpEF # Pulmonary hypertension, severe # Moderate AS, MS # TR, severe # Bradycardia s/p AICD Appears compensated - hold torsemide  and metolazone  while fluid resuscitating 5/2 restarted torsemide  100 mg p.o. twice daily as per cardio 5/4 cardiology recommended that patient is appropriate for the hospice care Resumed torsemide  and metolazone  home dose.   # COPD # Chronic hypoxic respiratory failure: Stable on home 2 liters   # A-fib: Not anticoagulated 2/2 recurrent GI bleed   # Pancreatic neuroendocrine tumor: Followed by dr. Maryalice Smaller outpt # Recurrent GI bleed: 2/2 AVMs. Hgb here stable # Anemia of chronic disease: Hb 9.7 on admission   # HTN: BPs soft, advised to monitor at home   # CKD 3/4 # History RCC s/p nephrectomy, Gfr in upper 20s is at baseline # Isotonic hyponatremia, most likely due to nutritional deficiency and patient is on diuretics Serum osmolality 293, within normal range Monitor electrolytes daily   # Dermatitis and possible fungal infection of bilateral lower extremities Started clotrimazole-betamethasone cream twice daily  # End-of-life care Prognosis is very poor. Patient reports she is followed by palliative as outpt, declines offer to meet with them here. Continues to want to treat the treatable. She is not ready for hospice yet.   # Debility:  PT/OT consulted, HH was arranged   Body mass index is 22.47 kg/m.  Nutrition Interventions:  Pain control  - [place]  Controlled Substance Reporting System database could not reviewe as website was not working. - Oxycodone  5 days supply supply was provided. - Patient was instructed, not to drive, operate heavy machinery, perform activities at heights, swimming or participation in water activities or provide baby sitting services while on Pain, Sleep and Anxiety Medications; until her outpatient Physician has advised to do so again.  - Also recommended to not to take more than prescribed Pain,  Sleep and Anxiety Medications.  Patient was seen by physical therapy, who recommended Home health, which was arranged. On the day of the discharge the patient's vitals were stable, and no other acute medical condition were reported by patient. the patient was felt safe to be discharge at Home with Home health.  Consultants: Cardiology and ID Procedures: None  Discharge Exam: General: Appear in no distress, no Rash; Oral Mucosa Clear, moist. Cardiovascular: S1 and S2 Present, Murmur audible, Respiratory: normal respiratory effort, Bilateral Air entry present and no Crackles, no wheezes Abdomen: Bowel Sound present, Soft and no tenderness, no hernia Extremities: Bilateral venous stasis pigmentation and chronic wounds, edema resolved Neurology: alert and oriented to time, place, and person affect appropriate.  Filed Weights   09/03/23 2020  Weight: 64.1 kg   Vitals:   09/08/23 0937 09/08/23 1640  BP: (!) 108/52 (!) 119/41  Pulse: 62 (!) 59  Resp: 16 16  Temp: 98.1 F (36.7 C) 98.4 F (36.9 C)  SpO2: 93% 100%    DISCHARGE MEDICATION: Allergies as of 09/08/2023       Reactions   Codeine Nausea And Vomiting, Other (See Comments)   Hallucinations, too- CANNOT HAVE; thinks she may have received Narcan  to reverse the effect   Dilaudid  [hydromorphone ] Other (See Comments)   Excessive Somnolence- Required Narcan  X2   Tape Other (See Comments)   CAN TOLERATE ONLY EASY-RELEASE,  PAPER TAPE, AS THE SKIN IS VERY THIN AND WILL BRUISE AND TEAR VERY EASILY   Amoxil  [amoxicillin ] Nausea Only   [place] Ito ] Other (See Comments)   Told to avoid due to reduced kidney function, has 1 remaining kidney   Ms Contin  [morphine ] Nausea And Vomiting, Other (See Comments)   Hallucinations, also   Neurontin  [gabapentin ] Other (See Comments)   "Out of it"   Nsaids Other (See Comments)   Told to avoid due to reduced kidney function, has 1 remaining kidney        Medication List     PAUSE taking  these medications    rosuvastatin  10 MG tablet Wait to take this until: Sep 13, 2023 Commonly known as: CRESTOR  Take 1 tablet (10 mg total) by mouth daily.       STOP taking these medications    cephALEXin  500 MG capsule Commonly known as: KEFLEX    potassium chloride  SA 20 MEQ tablet Commonly known as: KLOR-CON  M       TAKE these medications    acetaminophen  500 MG tablet Commonly known as: TYLENOL  Take 1,000 mg by mouth every 8 (eight) hours.   B-12 PO Take 1 tablet by mouth daily.   ciprofloxacin  500 MG tablet Commonly known as: CIPRO  Take 1 tablet (500 mg total) by mouth daily with breakfast for 4 days. Start taking on: Sep 09, 2023   clotrimazole-betamethasone cream Commonly known as: LOTRISONE Apply topically 2 (two) times daily.   CRANBERRY PO Take 1 tablet by mouth daily.   ferrous gluconate  324 MG tablet Commonly known as: FERGON Take 1 tablet (324 mg total) by mouth daily with breakfast.   fluticasone  50 MCG/ACT nasal spray Commonly known as: FLONASE  Place 2 sprays into both nostrils daily as needed for allergies or rhinitis.   HAIR SKIN NAILS PO Take 1 capsule by mouth daily.   methocarbamol  500 MG tablet Commonly known as: ROBAXIN  Take 1 tablet (500 mg total) by mouth every 8 (eight) hours as needed for muscle spasms.   metolazone  2.5 MG tablet Commonly known as: ZAROXOLYN  Take 1 tablet (2.5 mg total) by mouth 3 (three) times a week. Every Tues., Thurs and Sat. with 40 meq of potassium   oxyCODONE  5 MG immediate release tablet Commonly known as: Roxicodone  Take 1 tablet (5 mg total) by mouth 3 (three) times daily as needed for up to 5 days for severe pain (pain score 7-10).   pantoprazole  40 MG tablet Commonly known as: PROTONIX  Take 1 tablet (40 mg total) by mouth daily.   Potassium Chloride  ER 20 MEQ Tbcr Take 1 tablet (20 mEq total) by mouth daily.   torsemide  20 MG tablet Commonly known as: DEMADEX  Take 5 tablets (100 mg total)  by mouth 2 (two) times daily.   VITAMIN C  PO Take 1 tablet by mouth daily.   VITAMIN D -3 PO Take 2 capsules by mouth daily.               Discharge Care Instructions  (From admission, onward)           Start     Ordered   09/08/23 0000  Discharge wound care:       Comments: As above   09/08/23 1440           Allergies  Allergen Reactions   Codeine Nausea And Vomiting and Other (See Comments)    Hallucinations, too- CANNOT HAVE; thinks she may have received Narcan  to reverse the effect   Dilaudid  [  Hydromorphone ] Other (See Comments)    Excessive Somnolence- Required Narcan  X2   Tape Other (See Comments)    CAN TOLERATE ONLY EASY-RELEASE, PAPER TAPE, AS THE SKIN IS VERY THIN AND WILL BRUISE AND TEAR VERY EASILY   Amoxil  [Amoxicillin ] Nausea Only   Asa [Aspirin ] Other (See Comments)    Told to avoid due to reduced kidney function, has 1 remaining kidney   Ms Contin  [Morphine ] Nausea And Vomiting and Other (See Comments)    Hallucinations, also   Neurontin  [Gabapentin ] Other (See Comments)    "Out of it"   Nsaids Other (See Comments)    Told to avoid due to reduced kidney function, has 1 remaining kidney   Discharge Instructions     Call MD for:  difficulty breathing, headache or visual disturbances   Complete by: As directed    Call MD for:  extreme fatigue   Complete by: As directed    Call MD for:  persistant dizziness or light-headedness   Complete by: As directed    Call MD for:  persistant nausea and vomiting   Complete by: As directed    Call MD for:  redness, tenderness, or signs of infection (pain, swelling, redness, odor or green/yellow discharge around incision site)   Complete by: As directed    Call MD for:  severe uncontrolled pain   Complete by: As directed    Call MD for:  temperature >100.4   Complete by: As directed    Diet general   Complete by: As directed    Discharge instructions   Complete by: As directed    Follow-up with PCP  in 1 week Follow-up with cardiology in 1 week Follow with palliative care and if condition deteriorates then patient is appropriate for hospice.   Discharge wound care:   Complete by: As directed    As above   Increase activity slowly   Complete by: As directed        The results of significant diagnostics from this hospitalization (including imaging, microbiology, ancillary and laboratory) are listed below for reference.    Significant Diagnostic Studies: ECHOCARDIOGRAM COMPLETE BUBBLE STUDY Result Date: 09/06/2023    ECHOCARDIOGRAM REPORT   Patient Name:   [name] Date of Exam: 09/06/2023 Medical Rec #:  [id number]         Height:       66.5 in Accession #:    [id number]        Weight:       141.3 lb Date of Birth:  1933-05-13         BSA:          1.735 m Patient Age:    89 years          BP:           124/65 mmHg Patient Gender: F                 HR:           61 bpm. Exam Location:  [facility] Procedure: 2D Echo, Cardiac Doppler and Color Doppler (Both Spectral and Color            Flow Doppler were utilized during procedure). Indications:     Bacteremia  History:         Patient has prior history of Echocardiogram examinations, most                  recent 05/28/2023. CHF, Defibrillator, Pulmonary HTN and TIA,  Arrythmias:Atrial Fibrillation and Bradycardia; Risk                  Factors:Hypertension and Dyslipidemia.  Sonographer:     Terrilee Few RCS Referring Phys:  [id number] NOAH BEDFORD EAVW Diagnosing Phys: Sheryle Donning MD IMPRESSIONS  1. Left ventricular ejection fraction, by estimation, is 60 to 65%. The left ventricle has normal function. The left ventricle has no regional wall motion abnormalities. There is mild left ventricular hypertrophy. Left ventricular diastolic function could not be evaluated. There is the interventricular septum is flattened in systole and diastole, consistent with right ventricular pressure and volume overload.  2. Right ventricular  systolic function is moderately reduced. The right ventricular size is severely enlarged. There is severely elevated pulmonary artery systolic pressure. The estimated right ventricular systolic pressure is 96.0 mmHg.  3. Left atrial size was moderately dilated.  4. Right atrial size was severely dilated.  5. A small pericardial effusion is present. Moderate pleural effusion in both left and right lateral regions.  6. The mitral valve is abnormal. Mild mitral valve regurgitation. Moderate mitral stenosis. The mean mitral valve gradient is 5.3 mmHg. Moderate mitral annular calcification.  7. Tricuspid valve regurgitation is severe.  8. The aortic valve is calcified. There is severe calcifcation of the aortic valve. There is severe thickening of the aortic valve. Aortic valve regurgitation is mild. Moderate aortic valve stenosis. Aortic valve area, by VTI measures 0.86 cm.  9. The inferior vena cava is dilated in size with <50% respiratory variability, suggesting right atrial pressure of 15 mmHg. Comparison(s): Prior images reviewed side by side. Conclusion(s)/Recommendation(s): Severe elevation in right sided pressures, with significant RV dysfunction. Unable to do bubble study as patient does not have IV access. No clear valvular vegetations noted; would typically recommend TEE if clinical concern for endocarditis, but with RVSP of 96 mmHg, she is prohibitive risk for anesthesia. FINDINGS  Left Ventricle: Left ventricular ejection fraction, by estimation, is 60 to 65%. The left ventricle has normal function. The left ventricle has no regional wall motion abnormalities. The left ventricular internal cavity size was normal in size. There is  mild left ventricular hypertrophy. The interventricular septum is flattened in systole and diastole, consistent with right ventricular pressure and volume overload. Left ventricular diastolic function could not be evaluated due to paced rhythm. Left ventricular diastolic function  could not be evaluated. Right Ventricle: The right ventricular size is severely enlarged. Right vetricular wall thickness was not well visualized. Right ventricular systolic function is moderately reduced. There is severely elevated pulmonary artery systolic pressure. The tricuspid regurgitant velocity is 4.50 m/s, and with an assumed right atrial pressure of 15 mmHg, the estimated right ventricular systolic pressure is 96.0 mmHg. Left Atrium: Left atrial size was moderately dilated. Right Atrium: Right atrial size was severely dilated. Pericardium: A small pericardial effusion is present. Mitral Valve: The mitral valve is abnormal. Moderate mitral annular calcification. Mild mitral valve regurgitation. Moderate mitral valve stenosis. MV peak gradient, 23.1 mmHg. The mean mitral valve gradient is 5.3 mmHg. Tricuspid Valve: The tricuspid valve is grossly normal. Tricuspid valve regurgitation is severe. No evidence of tricuspid stenosis. Aortic Valve: Likely low flow-low gradient moderate aortic stenosis given stroke volume. The aortic valve is calcified. There is severe calcifcation of the aortic valve. There is severe thickening of the aortic valve. There is moderate to severe aortic valve annular calcification. Aortic valve regurgitation is mild. Aortic regurgitation PHT measures 369 msec. Moderate aortic stenosis is present. Aortic valve  mean gradient measures 13.7 mmHg. Aortic valve peak gradient measures 23.8 mmHg. Aortic valve area, by VTI measures 0.86 cm. Pulmonic Valve: The pulmonic valve was grossly normal. Pulmonic valve regurgitation is mild to moderate. No evidence of pulmonic stenosis. Aorta: The aortic root and ascending aorta are structurally normal, with no evidence of dilitation. Venous: The inferior vena cava is dilated in size with less than 50% respiratory variability, suggesting right atrial pressure of 15 mmHg. IAS/Shunts: The atrial septum is grossly normal. Additional Comments: A device lead  is visualized in the right atrium. There is a moderate pleural effusion in both left and right lateral regions.  LEFT VENTRICLE PLAX 2D LVIDd:         3.80 cm   Diastology LVIDs:         2.40 cm   LV e' medial:    9.46 cm/s LV PW:         1.30 cm   LV E/e' medial:  8.7 LV IVS:        0.80 cm   LV e' lateral:   8.49 cm/s LVOT diam:     1.90 cm   LV E/e' lateral: 9.7 LV SV:         48 LV SV Index:   28 LVOT Area:     2.84 cm  RIGHT VENTRICLE             IVC RV S prime:     13.30 cm/s  IVC diam: 2.30 cm TAPSE (M-mode): 1.2 cm LEFT ATRIUM             Index        RIGHT ATRIUM           Index LA diam:        5.20 cm 3.00 cm/m   RA Area:     32.40 cm LA Vol (A2C):   71.7 ml 41.32 ml/m  RA Volume:   126.00 ml 72.62 ml/m LA Vol (A4C):   55.4 ml 31.93 ml/m LA Biplane Vol: 66.0 ml 38.04 ml/m  AORTIC VALVE                     PULMONIC VALVE AV Area (Vmax):    0.89 cm      PV Vmax:          1.17 m/s AV Area (Vmean):   0.82 cm      PV Peak grad:     5.5 mmHg AV Area (VTI):     0.86 cm      PR End Diast Vel: 7.13 msec AV Vmax:           244.00 cm/s AV Vmean:          171.333 cm/s AV VTI:            0.557 m AV Peak Grad:      23.8 mmHg AV Mean Grad:      13.7 mmHg LVOT Vmax:         76.77 cm/s LVOT Vmean:        49.267 cm/s LVOT VTI:          0.169 m LVOT/AV VTI ratio: 0.30 AI PHT:            369 msec  AORTA Ao Root diam: 2.70 cm Ao Asc diam:  2.70 cm MITRAL VALVE               TRICUSPID VALVE MV Area (PHT): 4.19 cm  TR Peak grad:   81.0 mmHg MV Area VTI:   0.90 cm    TR Vmax:        450.00 cm/s MV Peak grad:  23.1 mmHg MV Mean grad:  5.3 mmHg    SHUNTS MV Vmax:       2.40 m/s    Systemic VTI:  0.17 m MV Vmean:      93.1 cm/s   Systemic Diam: 1.90 cm MV Decel Time: 181 msec MR Peak grad: 114.7 mmHg MR Vmax:      535.50 cm/s MV E velocity: 82.10 cm/s Sheryle Donning MD Electronically signed by Sheryle Donning MD Signature Date/Time: 09/06/2023/1:28:48 PM    Final    DG Chest Port 1 View Result Date:  09/03/2023 CLINICAL DATA:  Question of sepsis to evaluate for abnormality. EXAM: PORTABLE CHEST 1 VIEW COMPARISON:  06/24/2023 FINDINGS: Cardiac pacemaker. Cardiac enlargement with pulmonary vascular congestion. Patchy perihilar infiltrates with interstitial changes in the lung bases, likely edema. Small bilateral pleural effusions are decreased since prior study. No focal consolidation. Mediastinal contours appear intact. Calcification of the aorta. Degenerative changes in the spine and shoulders. Sclerosis in the left humeral head likely represents benign bone island or possibly enchondroma. No change. IMPRESSION: Cardiac enlargement with pulmonary vascular congestion and interstitial edema. Small bilateral pleural effusions are decreased since prior study. Electronically Signed   By: Boyce Byes M.D.   On: 09/03/2023 16:13   CT ABDOMEN PELVIS WO CONTRAST Result Date: 09/03/2023 CLINICAL DATA:  Acute nonlocalized abdominal pain. Bilateral leg swelling and wounds. EXAM: CT ABDOMEN AND PELVIS WITHOUT CONTRAST TECHNIQUE: Multidetector CT imaging of the abdomen and pelvis was performed following the standard protocol without IV contrast. RADIATION DOSE REDUCTION: This exam was performed according to the departmental dose-optimization program which includes automated exposure control, adjustment of the mA and/or kV according to patient size and/or use of iterative reconstruction technique. COMPARISON:  11/04/2022 FINDINGS: Lower chest: Motion artifact limits evaluation. Mild interstitial changes in the lungs may represent fibrosis or edema. Scattered pulmonary nodules. Largest is on the right, measuring 5 mm diameter. Series 4, image 4. Cardiac enlargement. Small bilateral pleural effusions with basilar atelectasis. Hepatobiliary: Liver parenchymal pattern is somewhat heterogeneous. Suggestion of focal nodules including a hypodense nodule in the dome measuring 1.6 cm and a vague nodular opacity in the inferior  right lobe likely measuring around 2 cm diameter. A left lobe nodule is demonstrated in the previous study that is not well seen today without contrast. Nodular changes are better characterized on previous contrast-enhanced studies. Lesions remain indeterminate. Pancreas: Mass in the head and body of the pancreas measuring 4.2 cm diameter. This is better seen on previous contrast-enhanced study and appears grossly unchanged. No pancreatic ductal dilatation. Spleen: Normal in size without focal abnormality. Adrenals/Urinary Tract: No adrenal gland nodules. Surgical absence of the right kidney. Left kidney appears normal. No hydronephrosis, hydroureter, or stones identified. Bladder is normal. Stomach/Bowel: Stomach, small bowel, and colon are not abnormally distended. No wall thickening or inflammatory changes. Stool throughout the colon. Appendix is not identified. Vascular/Lymphatic: Calcification of the aorta. Aortoiliac stent graft. No aneurysm. Retroperitoneal lymphadenopathy with periaortic nodes measuring up to about 10 mm diameter. This is similar to prior study. Reproductive: No pelvic mass or collections identified. Other: No free air or free fluid in the abdomen. Abdominal wall musculature appears intact. Musculoskeletal: Bilateral total hip replacements. Compression fracture of L2, unchanged since prior study. Degenerative changes in the spine. IMPRESSION: 1. 4.2 cm diameter mass  in the pancreatic head/body appears grossly unchanged since prior study. 2. Indeterminate hypodense liver nodules appear unchanged. 3. Small bilateral pleural effusions with basilar atelectasis and interstitial changes. Cardiac enlargement. 4. Multiple pulmonary nodules. Most significant: Right solid pulmonary nodule measuring 5 mm. In the setting of probable cancer, these lesions are indeterminate. Follow-up as per protocol. 5. Aortic atherosclerosis. 6. Nonspecific retroperitoneal lymphadenopathy with lymph nodes measuring up  to 10 mm diameter. 7. Surgical absence of the right kidney. Electronically Signed   By: Boyce Byes M.D.   On: 09/03/2023 16:12   CT L-SPINE NO CHARGE Result Date: 09/03/2023 CLINICAL DATA:  Abdominal pain, bilateral leg swelling and wounds EXAM: CT LUMBAR SPINE WITHOUT CONTRAST TECHNIQUE: Multidetector CT imaging of the lumbar spine was performed without intravenous contrast administration. Multiplanar CT image reconstructions were also generated. RADIATION DOSE REDUCTION: This exam was performed according to the departmental dose-optimization program which includes automated exposure control, adjustment of the mA and/or kV according to patient size and/or use of iterative reconstruction technique. COMPARISON:  11/04/2022 FINDINGS: Segmentation: 5 lumbar type vertebrae. Alignment: Stable kyphosis centered at a chronic L2 compression fracture. Otherwise alignment is anatomic. Vertebrae: Bones are severely osteopenic. Chronic L2 compression deformity with greater than 75% loss of height and 8 mm of retropulsion, unchanged since previous exam. No acute displaced fractures. No destructive bony lesions. Paraspinal and other soft tissues: The paraspinal soft tissues are unremarkable. Atherosclerosis of the aorta, with bilateral iliac stents noted. Trace bilateral pleural effusions. Please refer to separately reported CT abdomen and pelvis exam for findings in that region. Disc levels: There is mild-to-moderate central canal stenosis at L1-2 and L2-3 related to the prior L2 compression fracture, stable. At L3-4 there is circumferential disc bulge with bilateral facet and ligamentum flavum hypertrophy, resulting in severe central canal stenosis and symmetrical bilateral neural foraminal encroachment. At L4-5 there is partial bony fusion across the disc space again noted. Mild circumferential disc osteophyte complex and bilateral facet hypertrophy contribute to mild central canal stenosis. At L5-S1 there is  circumferential disc bulge with bilateral facet and ligamentum flavum hypertrophy resulting in moderate central canal stenosis. Reconstructed images demonstrate no additional findings. IMPRESSION: 1. No acute lumbar spine fracture. 2. Stable chronic L2 compression deformity with greater than 75% loss of height. 3. Severe osteopenia. 4. Multilevel lumbar degenerative changes as above, with central canal stenosis most pronounced at L3-4 and L5-S1. 5. Please refer to separately reported CT abdomen and pelvis exam for findings in that region. Electronically Signed   By: Bobbye Burrow M.D.   On: 09/03/2023 16:08    Microbiology: Recent Results (from the past 240 hours)  Resp panel by RT-PCR (RSV, Flu A&B, Covid) Anterior Nasal Swab     Status: None   Collection Time: 09/03/23  3:34 PM   Specimen: Anterior Nasal Swab  Result Value Ref Range Status   SARS Coronavirus 2 by RT PCR NEGATIVE NEGATIVE Final    Comment: (NOTE) SARS-CoV-2 target nucleic acids are NOT DETECTED.  The SARS-CoV-2 RNA is generally detectable in upper respiratory specimens during the acute phase of infection. The lowest concentration of SARS-CoV-2 viral copies this assay can detect is 138 copies/mL. A negative result does not preclude SARS-Cov-2 infection and should not be used as the sole basis for treatment or other patient management decisions. A negative result may occur with  improper specimen collection/handling, submission of specimen other than nasopharyngeal swab, presence of viral mutation(s) within the areas targeted by this assay, and inadequate number of  viral copies(<138 copies/mL). A negative result must be combined with clinical observations, patient history, and epidemiological information. The expected result is Negative.  Fact Sheet for Patients:  BloggerCourse.com  Fact Sheet for Healthcare Providers:  SeriousBroker.it  This test is no t yet approved  or cleared by the [place]  FDA and  has been authorized for detection and/or diagnosis of SARS-CoV-2 by FDA under an Emergency Use Authorization (EUA). This EUA will remain  in effect (meaning this test can be used) for the duration of the COVID-19 declaration under Section 564(b)(1) of the Act, 21 U.S.C.section 360bbb-3(b)(1), unless the authorization is terminated  or revoked sooner.       Influenza A by PCR NEGATIVE NEGATIVE Final   Influenza B by PCR NEGATIVE NEGATIVE Final    Comment: (NOTE) The Xpert Xpress SARS-CoV-2/FLU/RSV plus assay is intended as an aid in the diagnosis of influenza from Nasopharyngeal swab specimens and should not be used as a sole basis for treatment. Nasal washings and aspirates are unacceptable for Xpert Xpress SARS-CoV-2/FLU/RSV testing.  Fact Sheet for Patients: BloggerCourse.com  Fact Sheet for Healthcare Providers: SeriousBroker.it  This test is not yet approved or cleared by the [place]  FDA and has been authorized for detection and/or diagnosis of SARS-CoV-2 by FDA under an Emergency Use Authorization (EUA). This EUA will remain in effect (meaning this test can be used) for the duration of the COVID-19 declaration under Section 564(b)(1) of the Act, 21 U.S.C. section 360bbb-3(b)(1), unless the authorization is terminated or revoked.     Resp Syncytial Virus by PCR NEGATIVE NEGATIVE Final    Comment: (NOTE) Fact Sheet for Patients: BloggerCourse.com  Fact Sheet for Healthcare Providers: SeriousBroker.it  This test is not yet approved or cleared by the [place]  FDA and has been authorized for detection and/or diagnosis of SARS-CoV-2 by FDA under an Emergency Use Authorization (EUA). This EUA will remain in effect (meaning this test can be used) for the duration of the COVID-19 declaration under Section 564(b)(1) of the Act,  21 U.S.C. section 360bbb-3(b)(1), unless the authorization is terminated or revoked.  Performed at [facility], [address] [place], [place] [address]   Blood Culture (routine x 2)     Status: Abnormal   Collection Time: 09/03/23  3:38 PM   Specimen: BLOOD  Result Value Ref Range Status   Specimen Description BLOOD RIGHT ANTECUBITAL  Final   Special Requests   Final    BOTTLES DRAWN AEROBIC AND ANAEROBIC Blood Culture adequate volume   Culture  Setup Time   Final    GRAM NEGATIVE RODS IN BOTH AEROBIC AND ANAEROBIC BOTTLES CRITICAL RESULT CALLED TO, READ BACK BY AND VERIFIED WITH: MADISON HUNT PHARMD 0725 09/04/23 HNM GRAM STAIN REVIEWED-AGREE WITH RESULT DRT    Culture MORGANELLA MORGANII (A)  Final   Report Status 09/06/2023 FINAL  Final   Organism ID, Bacteria MORGANELLA MORGANII  Final      Susceptibility   Morganella morganii - MIC*    AMPICILLIN >=32 RESISTANT Resistant     CEFTAZIDIME <=1 SENSITIVE Sensitive     CIPROFLOXACIN  <=0.25 SENSITIVE Sensitive     GENTAMICIN  <=1 SENSITIVE Sensitive     IMIPENEM 1 SENSITIVE Sensitive     TRIMETH/SULFA <=20 SENSITIVE Sensitive     AMPICILLIN/SULBACTAM 16 INTERMEDIATE Intermediate     PIP/TAZO <=4 SENSITIVE Sensitive ug/mL    * MORGANELLA MORGANII  Blood Culture ID Panel (Reflexed)     Status: Abnormal   Collection Time: 09/03/23  3:38  PM  Result Value Ref Range Status   Enterococcus faecalis NOT DETECTED NOT DETECTED Final   Enterococcus Faecium NOT DETECTED NOT DETECTED Final   Listeria monocytogenes NOT DETECTED NOT DETECTED Final   Staphylococcus species NOT DETECTED NOT DETECTED Final   Staphylococcus aureus (BCID) NOT DETECTED NOT DETECTED Final   Staphylococcus epidermidis NOT DETECTED NOT DETECTED Final   Staphylococcus lugdunensis NOT DETECTED NOT DETECTED Final   Streptococcus species NOT DETECTED NOT DETECTED Final   Streptococcus agalactiae NOT DETECTED NOT DETECTED Final   Streptococcus pneumoniae  NOT DETECTED NOT DETECTED Final   Streptococcus pyogenes NOT DETECTED NOT DETECTED Final   A.calcoaceticus-baumannii NOT DETECTED NOT DETECTED Final   Bacteroides fragilis NOT DETECTED NOT DETECTED Final   Enterobacterales DETECTED (A) NOT DETECTED Final    Comment: Enterobacterales represent a large order of gram negative bacteria, not a single organism. Refer to culture for further identification. CRITICAL RESULT CALLED TO, READ BACK BY AND VERIFIED WITH: MADISON HUNT PHARMD 0725 09/04/23 HNM    Enterobacter cloacae complex NOT DETECTED NOT DETECTED Final   Escherichia coli NOT DETECTED NOT DETECTED Final   Klebsiella aerogenes NOT DETECTED NOT DETECTED Final   Klebsiella oxytoca NOT DETECTED NOT DETECTED Final   Klebsiella pneumoniae NOT DETECTED NOT DETECTED Final   Proteus species NOT DETECTED NOT DETECTED Final   Salmonella species NOT DETECTED NOT DETECTED Final   Serratia marcescens NOT DETECTED NOT DETECTED Final   Haemophilus influenzae NOT DETECTED NOT DETECTED Final   Neisseria meningitidis NOT DETECTED NOT DETECTED Final   Pseudomonas aeruginosa NOT DETECTED NOT DETECTED Final   Stenotrophomonas maltophilia NOT DETECTED NOT DETECTED Final   Candida albicans NOT DETECTED NOT DETECTED Final   Candida auris NOT DETECTED NOT DETECTED Final   Candida glabrata NOT DETECTED NOT DETECTED Final   Candida krusei NOT DETECTED NOT DETECTED Final   Candida parapsilosis NOT DETECTED NOT DETECTED Final   Candida tropicalis NOT DETECTED NOT DETECTED Final   Cryptococcus neoformans/gattii NOT DETECTED NOT DETECTED Final   CTX-M ESBL NOT DETECTED NOT DETECTED Final   Carbapenem resistance IMP NOT DETECTED NOT DETECTED Final   Carbapenem resistance KPC NOT DETECTED NOT DETECTED Final   Carbapenem resistance NDM NOT DETECTED NOT DETECTED Final   Carbapenem resist OXA 48 LIKE NOT DETECTED NOT DETECTED Final   Carbapenem resistance VIM NOT DETECTED NOT DETECTED Final    Comment: Performed at  [facility], [address] [place], [place] [address]  Blood Culture (routine x 2)     Status: Abnormal   Collection Time: 09/03/23  4:00 PM   Specimen: BLOOD LEFT FOREARM  Result Value Ref Range Status   Specimen Description   Final    BLOOD LEFT FOREARM Performed at [facility], [address], [place], [place] [address]    Special Requests   Final    BOTTLES DRAWN AEROBIC AND ANAEROBIC Blood Culture adequate volume Performed at [facility], [address] [place], [place] [address]    Culture (A)  Final    MORGANELLA MORGANII IN BOTH AEROBIC AND ANAEROBIC BOTTLES CRITICAL VALUE NOTED.  VALUE IS CONSISTENT WITH PREVIOUSLY REPORTED AND CALLED VALUE. GRAM STAIN REVIEWED-AGREE WITH RESULT DRT SUSCEPTIBILITIES PERFORMED ON PREVIOUS CULTURE WITHIN THE LAST 5 DAYS. Performed at [facility], [address], [place], [place] [address]    Report Status 09/06/2023 FINAL  Final  Culture, blood (Routine X 2) w Reflex to ID Panel     Status: None (Preliminary  result)   Collection Time: 09/05/23  1:15 PM   Specimen: BLOOD  Result Value Ref Range Status   Specimen Description BLOOD BLOOD LEFT ARM  Final   Special Requests   Final    BOTTLES DRAWN AEROBIC AND ANAEROBIC Blood Culture adequate volume   Culture   Final    NO GROWTH 3 DAYS Performed at [facility], [address] [place], [place] [address]    Report Status PENDING  Incomplete  Culture, blood (Routine X 2) w Reflex to ID Panel     Status: None (Preliminary result)   Collection Time: 09/05/23  1:15 PM   Specimen: BLOOD  Result Value Ref Range Status   Specimen Description BLOOD BLOOD RIGHT HAND  Final   Special Requests   Final    BOTTLES DRAWN AEROBIC AND ANAEROBIC Blood Culture adequate volume   Culture   Final    NO GROWTH 3 DAYS Performed at [facility], [address] [place], [place] [address]    Report Status PENDING  Incomplete     Labs: CBC: Recent  Labs  Lab 09/03/23 1600 09/04/23 0502 09/05/23 0303 09/06/23 0342 09/07/23 0330 09/08/23 0252  WBC 11.2* 10.1 6.1 6.0 4.7 4.4  NEUTROABS 9.2*  --   --   --   --   --   HGB 9.7* 8.2* 8.1* 8.5* 8.2* 8.1*  HCT 33.7* 27.5* 28.4* 29.6* 28.3* 27.7*  MCV 79.9* 78.3* 80.0 78.3* 78.6* 78.2*  PLT 273 212 196 222 200 198   Basic Metabolic Panel: Recent Labs  Lab 09/04/23 0502 09/05/23 0303 09/06/23 0342 09/07/23 0330 09/08/23 0252  NA 131* 130* 133* 136 134*  K 4.2 4.3 3.8 3.5 3.5  CL 97* 97* 93* 92* 93*  CO2 28 26 28 31 30   GLUCOSE 99 105* 117* 121* 122*  BUN 51* 52* 58* 55* 52*  CREATININE 1.89* 1.91* 1.77* 1.96* 1.90*  CALCIUM  7.9* 8.2* 8.5* 8.7* 8.4*  MG 2.0 2.3 2.2 2.2  --   PHOS 4.9* 4.5 3.8  --   --    Liver Function Tests: Recent Labs  Lab 09/03/23 1600  AST 21  ALT 10  ALKPHOS 61  BILITOT 1.1  PROT 7.8  ALBUMIN  3.6   Recent Labs  Lab 09/03/23 1600  LIPASE 43   No results for input(s): "AMMONIA" in the last 168 hours. Cardiac Enzymes: No results for input(s): "CKTOTAL", "CKMB", "CKMBINDEX", "TROPONINI" in the last 168 hours. BNP (last 3 results) Recent Labs    06/13/23 1552 06/24/23 1621 07/10/23 1641  BNP 349.6* 516.7* 322.2*   CBG: No results for input(s): "GLUCAP" in the last 168 hours.  Time spent: 35 minutes  Signed:  Althia Atlas  [facility] 09/08/2023 5:04 PM

## 2023-09-08 NOTE — Progress Notes (Signed)
 PT Cancellation Note  Patient Details Name: [name] MRN: [id number] DOB: 1934/03/27   Cancelled Treatment:    Reason Eval/Treat Not Completed: Other (comment). Pt politely declines therapy at this time and reports she is saving her strength/endurance for dc home this date.   Chana Lindstrom 09/08/2023, 10:30 AM Amparo Balk, PT, DPT, GCS [phone]

## 2023-09-08 NOTE — TOC Transition Note (Signed)
 Transition of Care [facility]) - Discharge Note   Patient Details  Name: [name] MRN: [id number] Date of Birth: 12/01/1933  [facility]) CM/SW Contact:  Crayton Docker, RN Phone Number: 09/08/2023, 5:04 PM   Clinical Narrative:     Discharge orders noted for home with home health. CM to patient's room regarding pending discharge and home health PT/OT and DME  recommendations for rolling walker. Patient declined rolling walker referral. Per patient has previous experience with [facility]. CM call to Verdis Glade, [facility] regarding home health PT/OT referral. Per Verdis Glade, will follow patient for home health services. Per patient, private transportation arranged. Patient states has home oxygen . Home oxygen  noted at bedside.   Final next level of care: Home w Home Health Services Barriers to Discharge: No Barriers Identified   Patient Goals and CMS Choice    Home with home health PT/OT    Discharge Placement               Home with home health PT/OT   Discharge Plan and Services Additional resources added to the After Visit Summary for                Home with home health PT/OT    HH Arranged: PT, OT HH Agency: [facility] Date [facility] Agency Contacted: 09/08/23 Time HH Agency Contacted: 1700 Representative spoke with at [facility] Agency: Verdis Glade  Social Drivers of Health (SDOH) Interventions SDOH Screenings   Food Insecurity: No Food Insecurity (09/03/2023)  Housing: Low Risk  (09/03/2023)  Transportation Needs: No Transportation Needs (09/03/2023)  Utilities: Not At Risk (09/03/2023)  Depression (PHQ2-9): Medium Risk (06/24/2023)  Financial Resource Strain: Low Risk  (05/28/2022)  Physical Activity: Inactive (09/24/2021)  Social Connections: Socially Isolated (09/03/2023)  Stress: No Stress Concern Present (09/24/2021)  Tobacco Use: Medium Risk (09/03/2023)     Readmission Risk Interventions    06/17/2023   10:07 AM 01/31/2023    3:53 PM  11/09/2022   12:00 PM  Readmission Risk Prevention Plan  Transportation Screening Complete Complete Complete  PCP or Specialist Appt within 3-5 Days   Complete  HRI or Home Care Consult  Complete Complete  Social Work Consult for Recovery Care Planning/Counseling   Complete  Palliative Care Screening  Not Applicable   Medication Review Ocean[facility])  Complete Complete  HRI or Home Care Consult Complete    SW Recovery Care/Counseling Consult Complete    Palliative Care Screening Not Applicable    Skilled Nursing Facility Not Applicable

## 2023-09-09 ENCOUNTER — Encounter: Admitting: Family

## 2023-09-09 ENCOUNTER — Telehealth: Payer: Self-pay

## 2023-09-09 ENCOUNTER — Other Ambulatory Visit: Payer: Self-pay

## 2023-09-09 NOTE — Transitions of Care (Post Inpatient/ED Visit) (Signed)
   09/09/2023  Name: [name] MRN: [id number] DOB: Nov 07, 1933  Today's TOC FU Call Status: Today's TOC FU Call Status:: Unsuccessful Call (1st Attempt) Unsuccessful Call (1st Attempt) Date: 09/09/23  Attempted to reach the patient regarding the most recent Inpatient/ED visit.  Follow Up Plan: Additional outreach attempts will be made to reach the patient to complete the Transitions of Care (Post Inpatient/ED visit) call.   Brown Cape, RN, BSN, CCM [facility], [facility] RN Care Manager Direct Dial: [phone]

## 2023-09-10 ENCOUNTER — Encounter

## 2023-09-10 ENCOUNTER — Telehealth: Payer: Self-pay

## 2023-09-10 DIAGNOSIS — I739 Peripheral vascular disease, unspecified: Secondary | ICD-10-CM | POA: Diagnosis not present

## 2023-09-10 DIAGNOSIS — I214 Non-ST elevation (NSTEMI) myocardial infarction: Secondary | ICD-10-CM | POA: Diagnosis not present

## 2023-09-10 DIAGNOSIS — I2721 Secondary pulmonary arterial hypertension: Secondary | ICD-10-CM | POA: Diagnosis not present

## 2023-09-10 DIAGNOSIS — D630 Anemia in neoplastic disease: Secondary | ICD-10-CM | POA: Diagnosis not present

## 2023-09-10 DIAGNOSIS — A4159 Other Gram-negative sepsis: Secondary | ICD-10-CM | POA: Diagnosis not present

## 2023-09-10 DIAGNOSIS — C44722 Squamous cell carcinoma of skin of right lower limb, including hip: Secondary | ICD-10-CM | POA: Diagnosis not present

## 2023-09-10 DIAGNOSIS — L03115 Cellulitis of right lower limb: Secondary | ICD-10-CM | POA: Diagnosis not present

## 2023-09-10 DIAGNOSIS — M1030 Gout due to renal impairment, unspecified site: Secondary | ICD-10-CM | POA: Diagnosis not present

## 2023-09-10 DIAGNOSIS — I083 Combined rheumatic disorders of mitral, aortic and tricuspid valves: Secondary | ICD-10-CM | POA: Diagnosis not present

## 2023-09-10 DIAGNOSIS — I959 Hypotension, unspecified: Secondary | ICD-10-CM | POA: Diagnosis not present

## 2023-09-10 DIAGNOSIS — C254 Malignant neoplasm of endocrine pancreas: Secondary | ICD-10-CM | POA: Diagnosis not present

## 2023-09-10 DIAGNOSIS — I87309 Chronic venous hypertension (idiopathic) without complications of unspecified lower extremity: Secondary | ICD-10-CM | POA: Diagnosis not present

## 2023-09-10 DIAGNOSIS — I050 Rheumatic mitral stenosis: Secondary | ICD-10-CM | POA: Diagnosis not present

## 2023-09-10 DIAGNOSIS — J9611 Chronic respiratory failure with hypoxia: Secondary | ICD-10-CM | POA: Diagnosis not present

## 2023-09-10 DIAGNOSIS — L03116 Cellulitis of left lower limb: Secondary | ICD-10-CM | POA: Diagnosis not present

## 2023-09-10 DIAGNOSIS — I4821 Permanent atrial fibrillation: Secondary | ICD-10-CM | POA: Diagnosis not present

## 2023-09-10 DIAGNOSIS — K922 Gastrointestinal hemorrhage, unspecified: Secondary | ICD-10-CM | POA: Diagnosis not present

## 2023-09-10 DIAGNOSIS — I130 Hypertensive heart and chronic kidney disease with heart failure and stage 1 through stage 4 chronic kidney disease, or unspecified chronic kidney disease: Secondary | ICD-10-CM | POA: Diagnosis not present

## 2023-09-10 DIAGNOSIS — N184 Chronic kidney disease, stage 4 (severe): Secondary | ICD-10-CM | POA: Diagnosis not present

## 2023-09-10 DIAGNOSIS — J449 Chronic obstructive pulmonary disease, unspecified: Secondary | ICD-10-CM | POA: Diagnosis not present

## 2023-09-10 DIAGNOSIS — I700 Atherosclerosis of aorta: Secondary | ICD-10-CM | POA: Diagnosis not present

## 2023-09-10 DIAGNOSIS — I5032 Chronic diastolic (congestive) heart failure: Secondary | ICD-10-CM | POA: Diagnosis not present

## 2023-09-10 DIAGNOSIS — D631 Anemia in chronic kidney disease: Secondary | ICD-10-CM | POA: Diagnosis not present

## 2023-09-10 DIAGNOSIS — I2510 Atherosclerotic heart disease of native coronary artery without angina pectoris: Secondary | ICD-10-CM | POA: Diagnosis not present

## 2023-09-10 LAB — CULTURE, BLOOD (ROUTINE X 2)
Culture: NO GROWTH
Culture: NO GROWTH
Special Requests: ADEQUATE
Special Requests: ADEQUATE

## 2023-09-10 NOTE — Transitions of Care (Post Inpatient/ED Visit) (Signed)
   09/10/2023  Name: [name] MRN: [id number] DOB: September 03, 1933  Today's TOC FU Call Status: Today's TOC FU Call Status:: Unsuccessful Call (2nd Attempt) Unsuccessful Call (2nd Attempt) Date: 09/10/23  Attempted to reach the patient regarding the most recent Inpatient/ED visit. Phone was answered and was asked to call back. This Clinical research associate called back in 10 minutes and phone went to busy signal, no answer not able to leave voice mail message.   Follow Up Plan: Additional outreach attempts will be made to reach the patient to complete the Transitions of Care (Post Inpatient/ED visit) call.   Brown Cape, RN, BSN, CCM [facility], [facility] Health RN Care Manager Direct Dial: [phone]

## 2023-09-11 ENCOUNTER — Other Ambulatory Visit: Payer: Self-pay

## 2023-09-11 ENCOUNTER — Telehealth: Payer: Self-pay | Admitting: Family Medicine

## 2023-09-11 ENCOUNTER — Telehealth: Payer: Self-pay

## 2023-09-11 ENCOUNTER — Telehealth: Payer: Self-pay | Admitting: Hematology

## 2023-09-11 MED ORDER — TRAMADOL HCL 50 MG PO TABS: 50.0000 mg | ORAL_TABLET | Freq: Three times a day (TID) | ORAL | 0 refills | Status: DC | PRN

## 2023-09-11 NOTE — Addendum Note (Signed)
 Addended by: Donnie Galea on: 09/11/2023 04:02 PM   Modules accepted: Orders

## 2023-09-11 NOTE — Telephone Encounter (Signed)
 Copied from CRM [id number]. Topic: Clinical - Medication Question >> Sep 11, 2023 10:08 AM Clyde Darling P wrote: Reason for CRM: Pt daughter advise pt was discharged from hospital 05/5, dr prescribe oxycodone , causes hallucinations so pt could not take for pain- had tramadol  left over from last hospital stay- lastnight, pt took half of tramadol  , a lot of pain in leg - tramadol  work fine , need 1 or 2 until appt tomorrow  Pt daughter cathy can be reached at [phone]

## 2023-09-11 NOTE — Telephone Encounter (Signed)
 Noted.   Attempted to notify pt's daughter, Caryl Clas (on dpr), that rx was sent in. No answer, no vm.   Spoke with pt notifying her rx was sent to pharmacy. Pt expresses her thanks.

## 2023-09-11 NOTE — Transitions of Care (Post Inpatient/ED Visit) (Signed)
   09/11/2023  Name: [name] MRN: [id number] DOB: 05-25-1933  Today's TOC FU Call Status: Today's TOC FU Call Status:: Successful TOC FU Call Completed TOC FU Call Complete Date: 09/11/23 Patient's Name and Date of Birth confirmed.  Transition Care Management Follow-up Telephone Call Date of Discharge: 09/08/23 Discharge Facility: [facility]) Type of Discharge: Inpatient Admission Primary Inpatient Discharge Diagnosis:: Sepsis bactermia How have you been since you were released from the hospital?: Same  Items Reviewed: Did you receive and understand the discharge instructions provided?: Yes Medications obtained,verified, and reconciled?: No (Patient states"no, I don't wish to go over them today, I went over my meds with [facility] yesterday) Any new allergies since your discharge?: No  Medications Reviewed Today: Medications Reviewed Today   Medications were not reviewed in this encounter    *Patient declined 30 day TOC program enrollment and to declined review medications states, I" just reviewed with [facility] 09/10/23 and no changes."    Brown Cape, RN, BSN, CCM [place]  [facility], [facility] Health RN Care Manager Direct Dial: [phone]

## 2023-09-11 NOTE — Telephone Encounter (Signed)
 Copied from CRM [id number]. Topic: Clinical - Medical Advice >> Sep 10, 2023  5:16 PM Jethro Morrison wrote: Reason for CRM: Clare Critchley [phone] CALLED STATED THE  oxyCODONE  (ROXICODONE ) 5 MG IS TO STRONG FOR HER , THE ONLY THING THAT WORKS FOR HER IS THE TRAMADOL  (ULTRAM ) 50 MG, STATED SHE COMES TO SEE DR Vallarie Gauze ON FRIDAY (05/09). WANTS TO KNOW IF SHE CAN GET A PRESCRIPTION FOR AT LEAST 2 OR THREE OF THE TRAMADOL  UNTIL SHE COMES FRIDAY.

## 2023-09-11 NOTE — Telephone Encounter (Signed)
 Fwd to Dr Para March in Dr Timoteo Expose absence.

## 2023-09-11 NOTE — Telephone Encounter (Signed)
 I sent the tramadol  rx.  Thanks.

## 2023-09-12 ENCOUNTER — Telehealth: Payer: Self-pay | Admitting: *Deleted

## 2023-09-12 ENCOUNTER — Telehealth: Payer: Self-pay | Admitting: Family

## 2023-09-12 ENCOUNTER — Encounter: Payer: Self-pay | Admitting: Family Medicine

## 2023-09-12 ENCOUNTER — Ambulatory Visit (INDEPENDENT_AMBULATORY_CARE_PROVIDER_SITE_OTHER): Admitting: Family Medicine

## 2023-09-12 VITALS — BP 120/50 | HR 60 | Temp 97.5°F | Ht 66.5 in

## 2023-09-12 DIAGNOSIS — I872 Venous insufficiency (chronic) (peripheral): Secondary | ICD-10-CM

## 2023-09-12 DIAGNOSIS — I5032 Chronic diastolic (congestive) heart failure: Secondary | ICD-10-CM | POA: Diagnosis not present

## 2023-09-12 DIAGNOSIS — D649 Anemia, unspecified: Secondary | ICD-10-CM | POA: Diagnosis not present

## 2023-09-12 DIAGNOSIS — R7881 Bacteremia: Secondary | ICD-10-CM | POA: Diagnosis not present

## 2023-09-12 LAB — CBC WITH DIFFERENTIAL/PLATELET
Basophils Absolute: 0 10*3/uL (ref 0.0–0.1)
Basophils Relative: 0.9 % (ref 0.0–3.0)
Eosinophils Absolute: 0 10*3/uL (ref 0.0–0.7)
Eosinophils Relative: 0.2 % (ref 0.0–5.0)
HCT: 24 % — ABNORMAL LOW (ref 36.0–46.0)
Hemoglobin: 7.3 g/dL — CL (ref 12.0–15.0)
Lymphocytes Relative: 8.5 % — ABNORMAL LOW (ref 12.0–46.0)
Lymphs Abs: 0.4 10*3/uL — ABNORMAL LOW (ref 0.7–4.0)
MCHC: 30.4 g/dL (ref 30.0–36.0)
MCV: 75.9 fl — ABNORMAL LOW (ref 78.0–100.0)
Monocytes Absolute: 0.5 10*3/uL (ref 0.1–1.0)
Monocytes Relative: 12.6 % — ABNORMAL HIGH (ref 3.0–12.0)
Neutro Abs: 3.3 10*3/uL (ref 1.4–7.7)
Neutrophils Relative %: 77.8 % — ABNORMAL HIGH (ref 43.0–77.0)
Platelets: 214 10*3/uL (ref 150.0–400.0)
RBC: 3.17 Mil/uL — ABNORMAL LOW (ref 3.87–5.11)
RDW: 20.8 % — ABNORMAL HIGH (ref 11.5–15.5)
WBC: 4.2 10*3/uL (ref 4.0–10.5)

## 2023-09-12 LAB — BASIC METABOLIC PANEL WITH GFR
BUN: 74 mg/dL — ABNORMAL HIGH (ref 6–23)
CO2: 34 meq/L — ABNORMAL HIGH (ref 19–32)
Calcium: 9.4 mg/dL (ref 8.4–10.5)
Chloride: 91 meq/L — ABNORMAL LOW (ref 96–112)
Creatinine, Ser: 1.75 mg/dL — ABNORMAL HIGH (ref 0.40–1.20)
GFR: 25.88 mL/min — ABNORMAL LOW (ref >60.00)
Glucose, Bld: 130 mg/dL — ABNORMAL HIGH (ref 70–99)
Potassium: 4.2 meq/L (ref 3.5–5.1)
Sodium: 135 meq/L (ref 135–145)

## 2023-09-12 NOTE — Patient Instructions (Addendum)
 Go to the lab on the way out.   If you have mychart we'll likely use that to update you.    Take care.  Glad to see you. Finish the antibiotics.  Restart crestor  tomorrow.    I'll update Dr. Crissie Dome and the blood clinic.  I'll check to see about getting the wound clinic appointment moved sooner.  Use the tramadol  in the meantime.

## 2023-09-12 NOTE — Progress Notes (Deleted)
 ADVANCED HF CLINIC NOTE      PCP:  Claire Crick, MD (last seen 05/25) Cardiologist:  Richardo Chandler, MD (last seen 11/24) Primary HF: Dr. Mitzie Anda (last seen 04/25)  Chief Complaint:     History of Present Illness:  Ms Diehl is a 89 y.o. female with history of permanent atrial fibrillation, chronic diastolic CHF, CKD stage 3, smoking/prior COPD, renal cell CA s/p right nephrectomy. She has a prior history of HFrEF with nonischemic cardiomyopathy and had a Medtronic ICD placed. However, subsequently her EF has recovered.    06/20, she had left hip ORIF after mechanical fall.  Echo in 06/20 showed EF 60-65%, mild RV dilation with normal systolic function, PASP 93 mmHg.  After she got home from the hospital stay post-ORIF, she noted marked peripheral edema. She was short of breath walking short distances.  +orthopnea. She came back to the hospital 07/20 and was admitted with volume overload. She was diuresed aggressively and lost about 22 lbs. RHC showed pulmonary venous hypertension. PCWP tracing had prominent v-waves in absence of significant MR, suggesting significant diastolic dysfunction. Noted to have significant Fe deficiency anemia, but FOBT was negative during 7/20 hospitalization.   Admitted 11/20 with symptomatic anemia, GI workup concerning for ischemic bowel. 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 Kermit Ped with VVS and conservative management recommended. Capsule endoscopy showed no definite source of bleeding. She was volume overloaded due to significant RV dysfunction and diuresed, she also developed AKI on CKD stage 3.      10/22, she had bilateral CIA stents placed. ABIs in 11/22 showed patent stents. She had a skin cancer removed from her left leg. 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   Echo 2/23 EF 65-70% with mild LVH, D-shaped septum with moderate RV enlargement, and mildly decreased RV function, PASP 92,  moderate-severe TR, dilated IVC, moderate aortic stenosis. Echo in 10/23 showed EF 60-65%, D-shaped septum, mild RV enlargement with normal RV systolic function, PASP 90, moderate-severe TR, mild AS, IVC dilated.   Patient was found to have a neuroendocrine tumor of the pancreatic head in the fall of 2023.  She has been treated with sandostatin  monthly.  She also had further GI bleeding from small bowel AVMs in fall 2023. She also had COVID-19 in 10/23 and has been on home oxygen  2L since that time.   Follow up with Dr. Rodolfo Clan, long discussion about EOL. Given DNR form and high-voltage therapies inactivated on ICD.  Admit 07/24 with acute on chronic blood loss anemia 2/2 GI bleed. Transfused and given IV iron . Had small bowel enteroscopy which showed 2 nonbleeding angiodysplastic lesions in duodenum and 3 nonbleeding angioplastic lesions in jejunum treated with APC.  Admitted early 09/24 with acute on chronic anemia suspected 2/2 chronic GI blood loss. Hgb down to 6.8. Received 2 u RBCs. GI did not recommend any endoscopic procedures d/t advanced age and comorbidities. Course further c/b acute on chronic CHF. She was diuresed with IV lasix  and metolazone . Echo during admit with LVEF 65-70%, D-shaped septum, mildly reduced RV, RVSP 103 mmHg, biatrial enlargement, severe TR, moderate mitral stenosis with mean gradient of 6 mmHg, dilated IVC.  She was seen back in the [facility] on 01/30/23 for post hospital f/u and noted recurrent melena, SOB and fatigued w/ minimal exertion. Also noted to have recurrent volume overload w/ marked return of LEE. Hgb was down to 6.6. She was sent back to the ED  and readmitted again for a/c CHF and GIB, treated w/ blood transfusions, IV Fe and diuretics. Discharged on 02/06/23 on torsemide  100 mg bid.   S/p 2u RBCs 04/26/23 for symptomatic anemia. Hgb 6.8.   Admitted 05/27/23 due to shortness of breath/ leg swelling. Initially given IV lasix   with removal of ~ 4Land transitioned to  oral torsemide  at 100mg  BID at discharge. Metolazone  decreased to weekly. Treated with antibiotics for leg cellulitis. Given 1 unit PRBC's due to hemoglobin trending down to 7.1. Wound care consulted due to weeping area on left shin. Palliative care also consulted.   She was admitted in 2/25 with lower leg cellulitis.  She was treated with IV abx.   Admitted 09/03/23 with morganella morganii bacteremia due to bilateral lower extremity cellulitis. Started IV cefepime  which was d/c'd on 5/3 due to patient being lethargic, possible cefepime  toxicity. 5/3 started Zosyn . IVF given for sepsis and lactic acid normalized. Cardiology consulted due to chest pain and recommended medical management.   She returns today for a post hospital HF visit with a chief complaint of   ECG 08/06/23: AF, v-paced  Medtronic device interrogation: stable thoracic impedance, 64% v-paced.   Labs (10/23): magnesium  1.7 Labs (3/24): TSH 2.93 Labs (4/24): K 3.9, creatinine 1.69, ALT 9, hgb 10, Plt 175 Labs (8/24): K 4.5, creatinine, 1.6 Labs (10/24): K 4.1, creatinine 1.55, Hgb 7.6 Labs (11/24): K 4.6, SCr 1.52 Labs (11/24): K 4.6, SCr 1.82 => 1.78, TSat 3, ferritin 16 Labs (12/24): K 3.9, creatinine 1.83 Labs (01/25): K 4.2, creatinine 1.89 Labs (2/25): hgb 8.5, BNP 517, K 3.5, creatinine 1.86 Labs (3/25): K 4, creatinine 2.13, hgb 7.8 Labs (05/25): K 4.2, creatinine 1.75, hgb 8.1 => 7.3  PMH: 1. COPD 2. Renal cell carcinoma: S/p right kidney resection.  3. CKD stage 3 4. Fe deficiency anemia 5. Atrial fibrillation: Permanent. She has failed amiodarone , Tikosyn , and flecainide  as well as multiple cardioversions. 6. Chronic diastolic CHF: She has history of prior HFrEF with nonischemic cardiomyopathy.  She has a Medtronic ICD.   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  - Echo (6/20): EF 60-65%, mildly dilated RV with PASP 93 mmHg, mild-moderate TR.  - RHC (7/20): mean RA 11, PA 64/18 mean 39, mean PCWP 24 with v  waves to 41, CI 4.21, PVR 1.9 WU.  - Cardiomems placement - RHC (11/20): mean RA 13, PA 63/23 mean 25 with v waves to 47 (MR only mild by echo), mean PCWP 25, CI 3.18, PVR 2.6 WU - Echo (11/21): EF 65-70%, moderate LVH, D-shaped septum with mildly decreased RV systolic function, RVSP 85.9 mmHg, severe RAE, severe TR, mod-sev AS. - Echo (2/23): EF 65-70% with mild LVH, D-shaped septum with moderate RV enlargement, and mildly decreased RV function, PASP 92, moderate-severe TR, dilated IVC, moderate aortic stenosis.  - Echo (10/23): EF 60-65%, D-shaped septum, mild RV enlargement with normal RV systolic function, PASP 90, moderate-severe TR, mild AS, IVC dilated.  - Echo (9/24): LVEF 65-70%, D-shaped septum, mildly reduced RV, RVSP 103 mmHg, biatrial enlargement, severe TR, moderate mitral stenosis with mean gradient of 6 mmHg, dilated IVC - Echo (01/25): EF 65-70%, mild LVH, RV moderately reduced, severely elevated PA pressure 83.9 mmHg, mild MR, moderate MS with mean gradient of 6.0 mmHg, IVC dialted 7. Hyperlipidemia 8. Carotid stenosis: Carotid dopplers (3/19) with 40-59% LICA stenosis.   9. Possible ischemic bowel in 11/20 10. 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 Conservative management per  Dr. Kermit Ped.  - 10/22 bilateral CIA stents.  - 11/22 ABIs: 1.07 left, 0.83 right with patent CIA stents.  11. 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  12. Squamous cell skin cancer.  13. Neuroendocrine tumor of pancreatic head: Treating with sandostatin .  14. COVID 19 in 10/23    Current Outpatient Medications on File Prior to Visit  Medication Sig Dispense Refill   acetaminophen  (TYLENOL ) 500 MG tablet Take 1,000 mg by mouth every 8 (eight) hours.     Ascorbic Acid  (VITAMIN C  PO) Take 1 tablet by mouth daily.     Cholecalciferol  (VITAMIN D -3 PO) Take 2 capsules by mouth daily.     ciprofloxacin  (CIPRO ) 500 MG tablet Take 1 tablet (500 mg  total) by mouth daily with breakfast for 4 days. 4 tablet 0   clotrimazole -betamethasone  (LOTRISONE ) cream Apply topically 2 (two) times daily. 30 g 0   CRANBERRY PO Take 1 tablet by mouth daily.     Cyanocobalamin  (B-12 PO) Take 1 tablet by mouth daily.     ferrous gluconate  (FERGON) 324 MG tablet Take 1 tablet (324 mg total) by mouth daily with breakfast. 30 tablet 3   fluticasone  (FLONASE ) 50 MCG/ACT nasal spray Place 2 sprays into both nostrils daily as needed for allergies or rhinitis. 16 g 0   methocarbamol  (ROBAXIN ) 500 MG tablet Take 1 tablet (500 mg total) by mouth every 8 (eight) hours as needed for muscle spasms. 30 tablet 0   metolazone  (ZAROXOLYN ) 2.5 MG tablet Take 1 tablet (2.5 mg total) by mouth 3 (three) times a week. Every Tues., Thurs and Sat. with 40 meq of potassium 90 tablet 3   Multiple Vitamins-Minerals (HAIR SKIN NAILS PO) Take 1 capsule by mouth daily.     pantoprazole  (PROTONIX ) 40 MG tablet Take 1 tablet (40 mg total) by mouth daily. 30 tablet 3   Potassium Chloride  ER 20 MEQ TBCR Take 1 tablet (20 mEq total) by mouth daily. 30 tablet 2   [Paused] rosuvastatin  (CRESTOR ) 10 MG tablet Take 1 tablet (10 mg total) by mouth daily. 30 tablet 0   torsemide  (DEMADEX ) 20 MG tablet Take 5 tablets (100 mg total) by mouth 2 (two) times daily. 250 tablet 3   traMADol  (ULTRAM ) 50 MG tablet Take 1 tablet (50 mg total) by mouth every 8 (eight) hours as needed for up to 5 days. Stop oxycodone . 15 tablet 0   No current facility-administered medications on file prior to visit.     Allergies:   Dilaudid  [hydromorphone ], Fentanyl , Codeine, Morphine  and related, Amoxicillin , Hydrocodone , Oxycodone , Tramadol , Whole blood, and Sulfonamide derivatives    Social History:  The patient  reports that she quit smoking about 21 years ago. Her smoking use included cigarettes. She has a 20.00 pack-year smoking history. She has never used smokeless tobacco. She reports that she does not drink alcohol  and does not use drugs.    Family History:  The patient's family history includes Breast cancer in her cousin and another family member; Breast cancer (age of onset: 16) in her maternal aunt; Early death in her father; Heart failure in her mother.    ROS:  Please see the history of present illness.   All other systems are personally reviewed and negative.       PHYSICAL EXAM:  General: NAD Neck: JVP 10 cm with prominent CV wave, no thyromegaly or thyroid  nodule.  Lungs: Clear to auscultation bilaterally with normal respiratory effort. CV: Nondisplaced PMI.  Heart regular S1/S2, no S3/S4, 3/6 HSM LLSB.  1+ edema to knees L>R.  No carotid bruit.  Unable to palpate pedal pulses.  Abdomen: Soft, nontender, no hepatosplenomegaly, no distention.  Skin: Intact without lesions or rashes.  Neurologic: Alert and oriented x 3.  Psych: Normal affect. Extremities: No clubbing or cyanosis.  HEENT: Normal.   ASSESSMENT AND PLAN:  1. Chronic diastolic CHF: With prominent RV failure. Echo in 6/20 showed EF 60-65%, mild RV dilation with normal systolic function, PASP 93 mmHg.  She had remote nonischemic cardiomyopathy with recovery of EF, has Medtronic ICD. Atrial fibrillation likely contributes, but this appears to be permanent now as she has failed multiple anti-arrhythmics and is reasonably rate-controlled. RHC 11/20 showed elevated filling pressures and pulmonary venous hypertension. Prominent v-waves on PCWP tracing likely due to stiff ventricle/diastolic dysfunction as she had only mild MR on echo. Echo 10/23 with EF 60-65%, D-shaped septum, mild RV enlargement with normal RV systolic function, PASP 90, moderate-severe TR, mild AS, IVC dilated. Echo in 9/242 with LVEF 65-70%, D-shaped septum, mildly reduced RV, RVSP 103 mmHg, biatrial enlargement, severe TR, mild AS with mean gradient of 17 mmhg, moderate mitral stenosis with mean gradient of 6 mmHg, dilated IVC. She is RV pacing at a high percentage  (64%), but LV EF has remained normal. NYHA class 3 to 3b, worse when she is anemic. She is volume overloaded on exam with prominent CV wave in setting of severe TR. Weight is stable.  I will continue same diuretic regimen without increase.  - Continue metolazone  2.5 mg three times weekly.  - Continue torsemide  100 mg bid and KCl 20 daily (40 on metolazone  days).  - BMET today - Off SGLT2i after UTIs 2. Hypertension: Borderline. elevated, will not add antihypertensives.  3. Pulmonary hypertension:  Based on RHC 11/20, she has primarily pulmonary venous hypertension, not candidate for pulmonary vasodilators.  4. CKD: Stage 4. Off SGLT2i.  BMET today.  5. Chronic anemia/GI bleeding: Due to small bowel AVMs. She has had transfusions and IV Fe.  - She is now off anticoagulation.  6. Atrial fibrillation: Permanent. Rate control is reasonable off nodal blockers. She has failed amiodarone , Tikosyn , and flecainide  as well as multiple cardioversions.  - Off Eliquis  with recurrent GI bleeding and transfusions.  - Not Watchman candidate due to frailty and poorly compensated CHF.  7. Neuroendocrine tumor of pancreatic head - Treating with bevacizumab .  8. Tricuspid regurgitation: Severe.  Prominent CV waves on JVP noted.  9. Mitral stenosis: Moderate on last echo.  - Follow, not surgical candidate.      Charlette Console, 09/12/23

## 2023-09-12 NOTE — Telephone Encounter (Signed)
 Please call Dr. Maryalice Smaller with hematology.  I am routing this note to Dr. Maryalice Smaller also.    My note from today is not yet done- I saw her in clinic today.  Her HGB is doen to 7.3.  Please see if the patient can get transfused through hematology in the very near future.   I thank all involved.   If the patient is feeling progressively worse, then she needs to go back to the ER.  The goal is to get her transfused before she needs ER eval/treatment.   Thanks.

## 2023-09-12 NOTE — Telephone Encounter (Signed)
 I thank all involved.  AV- see below and update patient.  Thanks.     Sonja , MD  You; Celesta Coke; Sharyon Deis, NP; Long, Philipp Brawn, CMA3 minutes ago (4:18 PM)    She needs type and screen before blood transfusion. Our lab is already closed.  We can try to get her in early next week. Heather/Kiandra, please reach out to charge nurse and [facility] to see if they can accommodate blood (1u) next Monday or Tuesday and let pt know. She has multiple antibodies, it may take a day to get blood ready for her. Thanks  Sonja 

## 2023-09-12 NOTE — Telephone Encounter (Signed)
 Patient notified and is aware that she should be expecting a call from Dr. Maryalice Smaller and her team

## 2023-09-12 NOTE — Telephone Encounter (Signed)
 Called to confirm/remind patient of their appointment at [facility] on [facility].   Appointment:   [] Confirmed  [] Left mess   [] No answer/No voice mail  [x] VM Full/unable to leave message  [] Phone not in service  Patient reminded to bring all medications and/or complete list.  Confirmed patient has transportation. Gave directions, instructed to utilize valet parking.

## 2023-09-12 NOTE — Telephone Encounter (Signed)
 [name] with [facility] lab called with Critical lab results.  Pt has a Critically low Hemoglobin of 7.3  Results given to Dr. Vallarie Gauze

## 2023-09-12 NOTE — Progress Notes (Unsigned)
 Leg pain.  Taking tramadol  for pain. Rx prev sent.  Sedation caution d/w pt.    Still on cipro .   No fevers in the meantime.

## 2023-09-14 ENCOUNTER — Encounter: Payer: Self-pay | Admitting: Family Medicine

## 2023-09-14 DIAGNOSIS — I872 Venous insufficiency (chronic) (peripheral): Secondary | ICD-10-CM | POA: Insufficient documentation

## 2023-09-14 NOTE — Assessment & Plan Note (Signed)
 With diffuse scabbing.  Continue diuresis and statin wound clinic referral placed.

## 2023-09-14 NOTE — Assessment & Plan Note (Signed)
 Recheck CBC pending at time of office visit.  Update-anemia noted and hematology contacted to arrange for outpatient transfusion if possible with the patient having instructions to go back to the emergency room sooner if she is progressively worsening.  I appreciate the help along follow-up.

## 2023-09-14 NOTE — Assessment & Plan Note (Signed)
 Continue diuresis with recheck labs pending.  See notes on labs.  Continue torsemide  with metolazone  as needed.

## 2023-09-14 NOTE — Assessment & Plan Note (Signed)
 She is going to finish her current antibiotics.  No fevers now.  At this point still okay for outpatient follow-up.

## 2023-09-15 ENCOUNTER — Other Ambulatory Visit: Payer: Self-pay | Admitting: Nurse Practitioner

## 2023-09-15 ENCOUNTER — Other Ambulatory Visit: Payer: Self-pay

## 2023-09-15 ENCOUNTER — Encounter: Admitting: Family

## 2023-09-15 DIAGNOSIS — D3A8 Other benign neuroendocrine tumors: Secondary | ICD-10-CM

## 2023-09-15 DIAGNOSIS — D500 Iron deficiency anemia secondary to blood loss (chronic): Secondary | ICD-10-CM

## 2023-09-16 ENCOUNTER — Ambulatory Visit: Admitting: Infectious Diseases

## 2023-09-16 ENCOUNTER — Observation Stay
Admission: EM | Admit: 2023-09-16 | Discharge: 2023-09-17 | Disposition: A | Discharge status: Home Health | Attending: Obstetrics and Gynecology | Admitting: Obstetrics and Gynecology

## 2023-09-16 ENCOUNTER — Other Ambulatory Visit: Payer: Self-pay

## 2023-09-16 ENCOUNTER — Encounter: Payer: Self-pay | Admitting: Emergency Medicine

## 2023-09-16 ENCOUNTER — Telehealth: Payer: Self-pay | Admitting: Hematology

## 2023-09-16 ENCOUNTER — Encounter: Admitting: Family

## 2023-09-16 DIAGNOSIS — K922 Gastrointestinal hemorrhage, unspecified: Secondary | ICD-10-CM | POA: Diagnosis present

## 2023-09-16 DIAGNOSIS — C254 Malignant neoplasm of endocrine pancreas: Secondary | ICD-10-CM | POA: Insufficient documentation

## 2023-09-16 DIAGNOSIS — D500 Iron deficiency anemia secondary to blood loss (chronic): Secondary | ICD-10-CM

## 2023-09-16 DIAGNOSIS — I739 Peripheral vascular disease, unspecified: Secondary | ICD-10-CM | POA: Diagnosis not present

## 2023-09-16 DIAGNOSIS — C649 Malignant neoplasm of unspecified kidney, except renal pelvis: Secondary | ICD-10-CM | POA: Diagnosis present

## 2023-09-16 DIAGNOSIS — K5530 Necrotizing enterocolitis, unspecified: Secondary | ICD-10-CM | POA: Diagnosis not present

## 2023-09-16 DIAGNOSIS — Z85528 Personal history of other malignant neoplasm of kidney: Secondary | ICD-10-CM | POA: Diagnosis not present

## 2023-09-16 DIAGNOSIS — N184 Chronic kidney disease, stage 4 (severe): Secondary | ICD-10-CM | POA: Diagnosis not present

## 2023-09-16 DIAGNOSIS — I5030 Unspecified diastolic (congestive) heart failure: Secondary | ICD-10-CM | POA: Diagnosis present

## 2023-09-16 DIAGNOSIS — K5520 Angiodysplasia of colon without hemorrhage: Secondary | ICD-10-CM | POA: Diagnosis present

## 2023-09-16 DIAGNOSIS — I4821 Permanent atrial fibrillation: Secondary | ICD-10-CM | POA: Diagnosis present

## 2023-09-16 DIAGNOSIS — Z95810 Presence of automatic (implantable) cardiac defibrillator: Secondary | ICD-10-CM | POA: Diagnosis present

## 2023-09-16 DIAGNOSIS — J9611 Chronic respiratory failure with hypoxia: Secondary | ICD-10-CM | POA: Diagnosis not present

## 2023-09-16 DIAGNOSIS — Z8619 Personal history of other infectious and parasitic diseases: Secondary | ICD-10-CM | POA: Insufficient documentation

## 2023-09-16 DIAGNOSIS — G459 Transient cerebral ischemic attack, unspecified: Secondary | ICD-10-CM | POA: Diagnosis not present

## 2023-09-16 DIAGNOSIS — D649 Anemia, unspecified: Principal | ICD-10-CM

## 2023-09-16 DIAGNOSIS — Z862 Personal history of diseases of the blood and blood-forming organs and certain disorders involving the immune mechanism: Secondary | ICD-10-CM | POA: Insufficient documentation

## 2023-09-16 DIAGNOSIS — J449 Chronic obstructive pulmonary disease, unspecified: Secondary | ICD-10-CM | POA: Diagnosis present

## 2023-09-16 DIAGNOSIS — E785 Hyperlipidemia, unspecified: Secondary | ICD-10-CM | POA: Diagnosis not present

## 2023-09-16 DIAGNOSIS — I2723 Pulmonary hypertension due to lung diseases and hypoxia: Secondary | ICD-10-CM | POA: Diagnosis present

## 2023-09-16 DIAGNOSIS — R627 Adult failure to thrive: Secondary | ICD-10-CM | POA: Diagnosis present

## 2023-09-16 DIAGNOSIS — I872 Venous insufficiency (chronic) (peripheral): Secondary | ICD-10-CM

## 2023-09-16 DIAGNOSIS — D3A8 Other benign neuroendocrine tumors: Secondary | ICD-10-CM | POA: Diagnosis present

## 2023-09-16 DIAGNOSIS — I10 Essential (primary) hypertension: Secondary | ICD-10-CM | POA: Diagnosis present

## 2023-09-16 DIAGNOSIS — Z66 Do not resuscitate: Secondary | ICD-10-CM | POA: Diagnosis present

## 2023-09-16 DIAGNOSIS — I272 Pulmonary hypertension, unspecified: Secondary | ICD-10-CM | POA: Diagnosis present

## 2023-09-16 DIAGNOSIS — Z87898 Personal history of other specified conditions: Secondary | ICD-10-CM

## 2023-09-16 DIAGNOSIS — I130 Hypertensive heart and chronic kidney disease with heart failure and stage 1 through stage 4 chronic kidney disease, or unspecified chronic kidney disease: Secondary | ICD-10-CM | POA: Diagnosis not present

## 2023-09-16 DIAGNOSIS — Z9981 Dependence on supplemental oxygen: Secondary | ICD-10-CM

## 2023-09-16 LAB — PREPARE RBC (CROSSMATCH)

## 2023-09-16 LAB — CBC
HCT: 26.2 % — ABNORMAL LOW (ref 36.0–46.0)
Hemoglobin: 7.4 g/dL — ABNORMAL LOW (ref 12.0–15.0)
MCH: 23 pg — ABNORMAL LOW (ref 26.0–34.0)
MCHC: 28.2 g/dL — ABNORMAL LOW (ref 30.0–36.0)
MCV: 81.4 fL (ref 80.0–100.0)
Platelets: 275 10*3/uL (ref 150–400)
RBC: 3.22 MIL/uL — ABNORMAL LOW (ref 3.87–5.11)
RDW: 20.6 % — ABNORMAL HIGH (ref 11.5–15.5)
WBC: 4 10*3/uL (ref 4.0–10.5)
nRBC: 0 % (ref 0.0–0.2)

## 2023-09-16 LAB — COMPREHENSIVE METABOLIC PANEL WITH GFR
ALT: 12 U/L (ref 0–44)
AST: 20 U/L (ref 15–41)
Albumin: 3.6 g/dL (ref 3.5–5.0)
Alkaline Phosphatase: 61 U/L (ref 38–126)
Anion gap: 8 (ref 5–15)
BUN: 72 mg/dL — ABNORMAL HIGH (ref 8–23)
CO2: 37 mmol/L — ABNORMAL HIGH (ref 22–32)
Calcium: 9.4 mg/dL (ref 8.9–10.3)
Chloride: 89 mmol/L — ABNORMAL LOW (ref 98–111)
Creatinine, Ser: 1.65 mg/dL — ABNORMAL HIGH (ref 0.44–1.00)
GFR, Estimated: 30 mL/min — ABNORMAL LOW (ref >60)
Glucose, Bld: 110 mg/dL — ABNORMAL HIGH (ref 70–99)
Potassium: 4.5 mmol/L (ref 3.5–5.1)
Sodium: 134 mmol/L — ABNORMAL LOW (ref 135–145)
Total Bilirubin: 1.1 mg/dL (ref 0.0–1.2)
Total Protein: 7.8 g/dL (ref 6.5–8.1)

## 2023-09-16 MED ORDER — FUROSEMIDE 10 MG/ML IJ SOLN
20.0000 mg | Freq: Once | INTRAMUSCULAR | Status: AC | PRN
  Administered 2023-09-17: 20 mg via INTRAVENOUS
  Filled 2023-09-16: qty 4

## 2023-09-16 MED ORDER — ACETAMINOPHEN 325 MG PO TABS
650.0000 mg | ORAL_TABLET | Freq: Four times a day (QID) | ORAL | Status: DC | PRN
  Administered 2023-09-17: 650 mg via ORAL

## 2023-09-16 MED ORDER — ONDANSETRON HCL 4 MG PO TABS
4.0000 mg | ORAL_TABLET | Freq: Four times a day (QID) | ORAL | Status: DC | PRN
Start: 2023-09-16 — End: 2023-09-21

## 2023-09-16 MED ORDER — SODIUM CHLORIDE 0.9 % IV SOLN
10.0000 mL/h | Freq: Once | INTRAVENOUS | Status: AC
  Administered 2023-09-16: 10 mL/h via INTRAVENOUS

## 2023-09-16 MED ORDER — FERROUS GLUCONATE 324 (38 FE) MG PO TABS
324.0000 mg | ORAL_TABLET | Freq: Every day | ORAL | Status: DC
  Filled 2023-09-16 (×2): qty 1

## 2023-09-16 MED ORDER — SENNOSIDES-DOCUSATE SODIUM 8.6-50 MG PO TABS: 1.0000 | ORAL_TABLET | Freq: Every evening | ORAL | Status: DC | PRN

## 2023-09-16 MED ORDER — METHYLPREDNISOLONE SODIUM SUCC 125 MG IJ SOLR
125.0000 mg | Freq: Once | INTRAMUSCULAR | Status: AC | PRN
  Administered 2023-09-17: 125 mg via INTRAVENOUS
  Filled 2023-09-16: qty 2

## 2023-09-16 MED ORDER — FLUTICASONE PROPIONATE 50 MCG/ACT NA SUSP: 2.0000 | Freq: Every day | NASAL | Status: DC | PRN

## 2023-09-16 MED ORDER — VITAMIN D3 25 MCG (1000 UNIT) PO TABS
2000.0000 [IU] | ORAL_TABLET | Freq: Every day | ORAL | Status: DC
  Administered 2023-09-17: 2000 [IU] via ORAL
  Filled 2023-09-16 (×2): qty 2

## 2023-09-16 MED ORDER — TRAMADOL HCL 50 MG PO TABS
50.0000 mg | ORAL_TABLET | Freq: Three times a day (TID) | ORAL | Status: DC | PRN
  Filled 2023-09-16: qty 1

## 2023-09-16 MED ORDER — ACETAMINOPHEN 325 MG PO TABS
325.0000 mg | ORAL_TABLET | Freq: Once | ORAL | Status: AC
  Administered 2023-09-16: 325 mg via ORAL
  Filled 2023-09-16: qty 1

## 2023-09-16 MED ORDER — HYDRALAZINE HCL 20 MG/ML IJ SOLN: 5.0000 mg | Freq: Four times a day (QID) | INTRAMUSCULAR | Status: DC | PRN

## 2023-09-16 MED ORDER — PANTOPRAZOLE SODIUM 40 MG IV SOLR
40.0000 mg | Freq: Two times a day (BID) | INTRAVENOUS | Status: DC
  Administered 2023-09-16 – 2023-09-17 (×2): 40 mg via INTRAVENOUS
  Filled 2023-09-16 (×2): qty 10

## 2023-09-16 MED ORDER — ACETAMINOPHEN 325 MG PO TABS
650.0000 mg | ORAL_TABLET | Freq: Once | ORAL | Status: DC | PRN
  Filled 2023-09-16: qty 2

## 2023-09-16 MED ORDER — ONDANSETRON HCL 4 MG/2ML IJ SOLN: 4.0000 mg | Freq: Four times a day (QID) | INTRAMUSCULAR | Status: DC | PRN

## 2023-09-16 MED ORDER — DIPHENHYDRAMINE HCL 25 MG PO CAPS
50.0000 mg | ORAL_CAPSULE | Freq: Once | ORAL | Status: AC | PRN
  Administered 2023-09-17: 50 mg via ORAL
  Filled 2023-09-16: qty 2

## 2023-09-16 MED ORDER — ROSUVASTATIN CALCIUM 10 MG PO TABS
10.0000 mg | ORAL_TABLET | Freq: Every day | ORAL | Status: DC
  Administered 2023-09-17: 10 mg via ORAL
  Filled 2023-09-16: qty 1

## 2023-09-16 MED ORDER — FAMOTIDINE IN NACL 20-0.9 MG/50ML-% IV SOLN
20.0000 mg | Freq: Once | INTRAVENOUS | Status: AC | PRN
  Administered 2023-09-17: 20 mg via INTRAVENOUS
  Filled 2023-09-16: qty 50

## 2023-09-16 MED ORDER — VITAMIN C 500 MG PO TABS
500.0000 mg | ORAL_TABLET | Freq: Every day | ORAL | Status: DC
  Administered 2023-09-17: 500 mg via ORAL
  Filled 2023-09-16: qty 1

## 2023-09-16 MED ORDER — METHOCARBAMOL 500 MG PO TABS
500.0000 mg | ORAL_TABLET | Freq: Three times a day (TID) | ORAL | Status: DC | PRN
Start: 2023-09-16 — End: 2023-09-17

## 2023-09-16 MED ORDER — ACETAMINOPHEN 650 MG RE SUPP: 650.0000 mg | Freq: Four times a day (QID) | RECTAL | Status: DC | PRN

## 2023-09-16 NOTE — Assessment & Plan Note (Signed)
Home rosuvastatin 10 mg nightly resumed

## 2023-09-16 NOTE — ED Notes (Signed)
 Pt stating need to void, pt able to walk with this RN to toilet, a little unsteady at first.

## 2023-09-16 NOTE — Hospital Course (Addendum)
 [name] is a 89 year old female with history of pancreatic neuroendocrine tumor, history of GI bleed with AVMs, CKD stage IV, history of bacteremia, hyperlipidemia, who presents emergency department for chief concerns of acute on chronic anemia.  Vitals in the ED showed temperature 97.6, respiration rate 17, heart rate of 60, blood pressure 107/90, SpO2 100% on 3 L nasal cannula.  Serum sodium is 134, potassium 4.3, chloride 89, bicarb 37, BUN 72, serum creatinine 1.65, EGFR 30, nonfasting blood glucose 110, WBC 4.0, hemoglobin 7.4, platelets of 275.  ED treatment: Famotidine  20 mg IV one-time dose, diphenhydramine  50 mg p.o. one-time dose, furosemide  20 mg IV one-time dose, 1 unit PRBC have been ordered for transfusion.  Patient has history of antibodies, takes a while to find blood.

## 2023-09-16 NOTE — Assessment & Plan Note (Addendum)
 Versus chronic mass bleed Protonix  40 mg IV twice daily initiated on admission I suspect the bleeding is from pancreatic mass

## 2023-09-16 NOTE — Assessment & Plan Note (Signed)
 Hydralazine 5 mg IV every 6 hours as needed for SBP greater 165, 5 days ordered

## 2023-09-16 NOTE — Assessment & Plan Note (Signed)
 Patient was discharged home with ciprofloxacin  500 mg daily with breakfast for 4 days.  Patient was to start medication on 09/09/2023 and would have completed last course of antibiotic on 09/12/2023

## 2023-09-16 NOTE — Progress Notes (Deleted)
 Patient Care Team: Claire Crick, MD as PCP - General (Family Medicine) Darlis Eisenmenger, MD as PCP - Advanced Heart Failure (Cardiology) Devorah Fonder, MD as PCP - Cardiology (Cardiology) Verona Goodwill, MD as PCP - Electrophysiology (Cardiology) Devorah Fonder, MD as Consulting Physician (Cardiology) Verona Goodwill, MD as Consulting Physician (Cardiology) Prescilla Brod, Ninette Basque, MD as Consulting Physician (Vascular Surgery) Avonne Boettcher, MD as Consulting Physician (Hematology and Oncology) Sonja [place], MD as Consulting Physician (Oncology) Jonathan Neighbor, [facility] (Inactive) as Pharmacist (Pharmacist)  Clinic Day:  09/16/2023  Referring physician: Claire Crick, MD  ASSESSMENT & PLAN:   Assessment & Plan: No problem-specific Assessment & Plan notes found for this encounter.    The patient understands the plans discussed today and is in agreement with them.  She knows to contact our office if she develops concerns prior to her next appointment.  I provided *** minutes of face-to-face time during this encounter and > 50% was spent counseling as documented under my assessment and plan.    Sharyon Deis, NP  [facility] - A DEPT OF [facility] [address] [place] [address] Dept: [phone] Dept Fax: [phone]   No orders of the defined types were placed in this encounter.     CHIEF COMPLAINT:  CC: ***  Current Treatment:  ***  INTERVAL HISTORY:  [name] is here today for repeat clinical assessment. She denies fevers or chills. She denies pain. Her appetite is good. Her weight {Weight change:10426}.  I have reviewed the past medical history, past surgical history, social history and family history with the patient and they are unchanged from previous note.  ALLERGIES:  is allergic to codeine, dilaudid  [hydromorphone ], tape, amoxil  [amoxicillin ], asa [aspirin ], ms contin   [morphine ], neurontin  [gabapentin ], and nsaids.  MEDICATIONS:  Current Outpatient Medications  Medication Sig Dispense Refill   acetaminophen  (TYLENOL ) 500 MG tablet Take 1,000 mg by mouth every 8 (eight) hours.     Ascorbic Acid  (VITAMIN C  PO) Take 1 tablet by mouth daily.     Cholecalciferol  (VITAMIN D -3 PO) Take 2 capsules by mouth daily.     clotrimazole -betamethasone  (LOTRISONE ) cream Apply topically 2 (two) times daily. 30 g 0   CRANBERRY PO Take 1 tablet by mouth daily.     Cyanocobalamin  (B-12 PO) Take 1 tablet by mouth daily.     ferrous gluconate  (FERGON) 324 MG tablet Take 1 tablet (324 mg total) by mouth daily with breakfast. 30 tablet 3   fluticasone  (FLONASE ) 50 MCG/ACT nasal spray Place 2 sprays into both nostrils daily as needed for allergies or rhinitis. 16 g 0   methocarbamol  (ROBAXIN ) 500 MG tablet Take 1 tablet (500 mg total) by mouth every 8 (eight) hours as needed for muscle spasms. 30 tablet 0   metolazone  (ZAROXOLYN ) 2.5 MG tablet Take 1 tablet (2.5 mg total) by mouth 3 (three) times a week. Every Tues., Thurs and Sat. with 40 meq of potassium 90 tablet 3   Multiple Vitamins-Minerals (HAIR SKIN NAILS PO) Take 1 capsule by mouth daily.     pantoprazole  (PROTONIX ) 40 MG tablet Take 1 tablet (40 mg total) by mouth daily. 30 tablet 3   Potassium Chloride  ER 20 MEQ TBCR Take 1 tablet (20 mEq total) by mouth daily. 30 tablet 2   rosuvastatin  (CRESTOR ) 10 MG tablet Take 1 tablet (10 mg total) by mouth daily. 30 tablet 0   torsemide  (DEMADEX )  20 MG tablet Take 5 tablets (100 mg total) by mouth 2 (two) times daily. 250 tablet 3   traMADol  (ULTRAM ) 50 MG tablet Take 1 tablet (50 mg total) by mouth every 8 (eight) hours as needed for up to 5 days. Stop oxycodone . 15 tablet 0   No current facility-administered medications for this visit.    HISTORY OF PRESENT ILLNESS:   Oncology History   No history exists.      REVIEW OF SYSTEMS:   Constitutional: Denies fevers, chills  or abnormal weight loss Eyes: Denies blurriness of vision Ears, nose, mouth, throat, and face: Denies mucositis or sore throat Respiratory: Denies cough, dyspnea or wheezes Cardiovascular: Denies palpitation, chest discomfort or lower extremity swelling Gastrointestinal:  Denies nausea, heartburn or change in bowel habits Skin: Denies abnormal skin rashes Lymphatics: Denies new lymphadenopathy or easy bruising Neurological:Denies numbness, tingling or new weaknesses Behavioral/Psych: Mood is stable, no new changes  All other systems were reviewed with the patient and are negative.   VITALS:   There were no vitals taken for this visit.  Wt Readings from Last 3 Encounters:  09/16/23 129 lb (58.5 kg)  09/03/23 141 lb 5 oz (64.1 kg)  08/11/23 139 lb 11.2 oz (63.4 kg)    There is no height or weight on file to calculate BMI.  Performance status (ECOG): {CHL ONC [id number]  PHYSICAL EXAM:   GENERAL:alert, no distress and comfortable SKIN: skin color, texture, turgor are normal, no rashes or significant lesions EYES: normal, Conjunctiva are pink and non-injected, sclera clear OROPHARYNX:no exudate, no erythema and lips, buccal mucosa, and tongue normal  NECK: supple, thyroid  normal size, non-tender, without nodularity LYMPH:  no palpable lymphadenopathy in the cervical, axillary or inguinal LUNGS: clear to auscultation and percussion with normal breathing effort HEART: regular rate & rhythm and no murmurs and no lower extremity edema ABDOMEN:abdomen soft, non-tender and normal bowel sounds Musculoskeletal:no cyanosis of digits and no clubbing  NEURO: alert & oriented x 3 with fluent speech, no focal motor/sensory deficits  LABORATORY DATA:  I have reviewed the data as listed    Component Value Date/Time   NA 134 (L) 09/16/2023 1453   NA 137 06/09/2023 1456   K 4.5 09/16/2023 1453   CL 89 (L) 09/16/2023 1453   CO2 37 (H) 09/16/2023 1453   GLUCOSE 110 (H) 09/16/2023 1453    BUN 72 (H) 09/16/2023 1453   BUN 48 (H) 06/09/2023 1456   CREATININE 1.65 (H) 09/16/2023 1453   CREATININE 2.13 (H) 08/04/2023 1256   CREATININE 2.12 (H) 06/23/2023 1518   CALCIUM  9.4 09/16/2023 1453   PROT 7.8 09/16/2023 1453   PROT 7.2 06/09/2014 1317   ALBUMIN  3.6 09/16/2023 1453   ALBUMIN  3.8 06/09/2014 1317   AST 20 09/16/2023 1453   AST 15 08/04/2023 1256   ALT 12 09/16/2023 1453   ALT 7 08/04/2023 1256   ALT 23 06/09/2014 1317   ALKPHOS 61 09/16/2023 1453   ALKPHOS 71 06/09/2014 1317   BILITOT 1.1 09/16/2023 1453   BILITOT 0.6 08/04/2023 1256   GFRNONAA 30 (L) 09/16/2023 1453   GFRNONAA 22 (L) 08/04/2023 1256   GFRAA 39 (L) 12/23/2019 1458    No results found for: "SPEP", "UPEP"  Lab Results  Component Value Date   WBC 4.0 09/16/2023   NEUTROABS 3.3 09/12/2023   HGB 7.4 (L) 09/16/2023   HCT 26.2 (L) 09/16/2023   MCV 81.4 09/16/2023   PLT 275 09/16/2023      Chemistry  Component Value Date/Time   NA 134 (L) 09/16/2023 1453   NA 137 06/09/2023 1456   K 4.5 09/16/2023 1453   CL 89 (L) 09/16/2023 1453   CO2 37 (H) 09/16/2023 1453   BUN 72 (H) 09/16/2023 1453   BUN 48 (H) 06/09/2023 1456   CREATININE 1.65 (H) 09/16/2023 1453   CREATININE 2.13 (H) 08/04/2023 1256   CREATININE 2.12 (H) 06/23/2023 1518   GLU 75 07/14/2019 0000      Component Value Date/Time   CALCIUM  9.4 09/16/2023 1453   ALKPHOS 61 09/16/2023 1453   ALKPHOS 71 06/09/2014 1317   AST 20 09/16/2023 1453   AST 15 08/04/2023 1256   ALT 12 09/16/2023 1453   ALT 7 08/04/2023 1256   ALT 23 06/09/2014 1317   BILITOT 1.1 09/16/2023 1453   BILITOT 0.6 08/04/2023 1256       RADIOGRAPHIC STUDIES: I have personally reviewed the radiological images as listed and agreed with the findings in the report. ECHOCARDIOGRAM COMPLETE BUBBLE STUDY Result Date: 09/06/2023    ECHOCARDIOGRAM REPORT   Patient Name:   [name] Date of Exam: 09/06/2023 Medical Rec #:  [id number]         Height:        66.5 in Accession #:    [id number]        Weight:       141.3 lb Date of Birth:  02-23-1934         BSA:          1.735 m Patient Age:    89 years          BP:           124/65 mmHg Patient Gender: F                 HR:           61 bpm. Exam Location:  [facility] Procedure: 2D Echo, Cardiac Doppler and Color Doppler (Both Spectral and Color            Flow Doppler were utilized during procedure). Indications:     Bacteremia  History:         Patient has prior history of Echocardiogram examinations, most                  recent 05/28/2023. CHF, Defibrillator, Pulmonary HTN and TIA,                  Arrythmias:Atrial Fibrillation and Bradycardia; Risk                  Factors:Hypertension and Dyslipidemia.  Sonographer:     Terrilee Few RCS Referring Phys:  [id number] NOAH BEDFORD ZHYQ Diagnosing Phys: Sheryle Donning MD IMPRESSIONS  1. Left ventricular ejection fraction, by estimation, is 60 to 65%. The left ventricle has normal function. The left ventricle has no regional wall motion abnormalities. There is mild left ventricular hypertrophy. Left ventricular diastolic function could not be evaluated. There is the interventricular septum is flattened in systole and diastole, consistent with right ventricular pressure and volume overload.  2. Right ventricular systolic function is moderately reduced. The right ventricular size is severely enlarged. There is severely elevated pulmonary artery systolic pressure. The estimated right ventricular systolic pressure is 96.0 mmHg.  3. Left atrial size was moderately dilated.  4. Right atrial size was severely dilated.  5. A small pericardial effusion is present. Moderate pleural effusion in both left and right lateral regions.  6. The mitral valve is abnormal. Mild mitral valve regurgitation. Moderate mitral stenosis. The mean mitral valve gradient is 5.3 mmHg. Moderate mitral annular calcification.  7. Tricuspid valve regurgitation is severe.  8. The aortic valve is  calcified. There is severe calcifcation of the aortic valve. There is severe thickening of the aortic valve. Aortic valve regurgitation is mild. Moderate aortic valve stenosis. Aortic valve area, by VTI measures 0.86 cm.  9. The inferior vena cava is dilated in size with <50% respiratory variability, suggesting right atrial pressure of 15 mmHg. Comparison(s): Prior images reviewed side by side. Conclusion(s)/Recommendation(s): Severe elevation in right sided pressures, with significant RV dysfunction. Unable to do bubble study as patient does not have IV access. No clear valvular vegetations noted; would typically recommend TEE if clinical concern for endocarditis, but with RVSP of 96 mmHg, she is prohibitive risk for anesthesia. FINDINGS  Left Ventricle: Left ventricular ejection fraction, by estimation, is 60 to 65%. The left ventricle has normal function. The left ventricle has no regional wall motion abnormalities. The left ventricular internal cavity size was normal in size. There is  mild left ventricular hypertrophy. The interventricular septum is flattened in systole and diastole, consistent with right ventricular pressure and volume overload. Left ventricular diastolic function could not be evaluated due to paced rhythm. Left ventricular diastolic function could not be evaluated. Right Ventricle: The right ventricular size is severely enlarged. Right vetricular wall thickness was not well visualized. Right ventricular systolic function is moderately reduced. There is severely elevated pulmonary artery systolic pressure. The tricuspid regurgitant velocity is 4.50 m/s, and with an assumed right atrial pressure of 15 mmHg, the estimated right ventricular systolic pressure is 96.0 mmHg. Left Atrium: Left atrial size was moderately dilated. Right Atrium: Right atrial size was severely dilated. Pericardium: A small pericardial effusion is present. Mitral Valve: The mitral valve is abnormal. Moderate mitral  annular calcification. Mild mitral valve regurgitation. Moderate mitral valve stenosis. MV peak gradient, 23.1 mmHg. The mean mitral valve gradient is 5.3 mmHg. Tricuspid Valve: The tricuspid valve is grossly normal. Tricuspid valve regurgitation is severe. No evidence of tricuspid stenosis. Aortic Valve: Likely low flow-low gradient moderate aortic stenosis given stroke volume. The aortic valve is calcified. There is severe calcifcation of the aortic valve. There is severe thickening of the aortic valve. There is moderate to severe aortic valve annular calcification. Aortic valve regurgitation is mild. Aortic regurgitation PHT measures 369 msec. Moderate aortic stenosis is present. Aortic valve mean gradient measures 13.7 mmHg. Aortic valve peak gradient measures 23.8 mmHg. Aortic valve area, by VTI measures 0.86 cm. Pulmonic Valve: The pulmonic valve was grossly normal. Pulmonic valve regurgitation is mild to moderate. No evidence of pulmonic stenosis. Aorta: The aortic root and ascending aorta are structurally normal, with no evidence of dilitation. Venous: The inferior vena cava is dilated in size with less than 50% respiratory variability, suggesting right atrial pressure of 15 mmHg. IAS/Shunts: The atrial septum is grossly normal. Additional Comments: A device lead is visualized in the right atrium. There is a moderate pleural effusion in both left and right lateral regions.  LEFT VENTRICLE PLAX 2D LVIDd:         3.80 cm   Diastology LVIDs:         2.40 cm   LV e' medial:    9.46 cm/s LV PW:         1.30 cm   LV E/e' medial:  8.7 LV IVS:  0.80 cm   LV e' lateral:   8.49 cm/s LVOT diam:     1.90 cm   LV E/e' lateral: 9.7 LV SV:         48 LV SV Index:   28 LVOT Area:     2.84 cm  RIGHT VENTRICLE             IVC RV S prime:     13.30 cm/s  IVC diam: 2.30 cm TAPSE (M-mode): 1.2 cm LEFT ATRIUM             Index        RIGHT ATRIUM           Index LA diam:        5.20 cm 3.00 cm/m   RA Area:     32.40 cm  LA Vol (A2C):   71.7 ml 41.32 ml/m  RA Volume:   126.00 ml 72.62 ml/m LA Vol (A4C):   55.4 ml 31.93 ml/m LA Biplane Vol: 66.0 ml 38.04 ml/m  AORTIC VALVE                     PULMONIC VALVE AV Area (Vmax):    0.89 cm      PV Vmax:          1.17 m/s AV Area (Vmean):   0.82 cm      PV Peak grad:     5.5 mmHg AV Area (VTI):     0.86 cm      PR End Diast Vel: 7.13 msec AV Vmax:           244.00 cm/s AV Vmean:          171.333 cm/s AV VTI:            0.557 m AV Peak Grad:      23.8 mmHg AV Mean Grad:      13.7 mmHg LVOT Vmax:         76.77 cm/s LVOT Vmean:        49.267 cm/s LVOT VTI:          0.169 m LVOT/AV VTI ratio: 0.30 AI PHT:            369 msec  AORTA Ao Root diam: 2.70 cm Ao Asc diam:  2.70 cm MITRAL VALVE               TRICUSPID VALVE MV Area (PHT): 4.19 cm    TR Peak grad:   81.0 mmHg MV Area VTI:   0.90 cm    TR Vmax:        450.00 cm/s MV Peak grad:  23.1 mmHg MV Mean grad:  5.3 mmHg    SHUNTS MV Vmax:       2.40 m/s    Systemic VTI:  0.17 m MV Vmean:      93.1 cm/s   Systemic Diam: 1.90 cm MV Decel Time: 181 msec MR Peak grad: 114.7 mmHg MR Vmax:      535.50 cm/s MV E velocity: 82.10 cm/s Sheryle Donning MD Electronically signed by Sheryle Donning MD Signature Date/Time: 09/06/2023/1:28:48 PM    Final    DG Chest Port 1 View Result Date: 09/03/2023 CLINICAL DATA:  Question of sepsis to evaluate for abnormality. EXAM: PORTABLE CHEST 1 VIEW COMPARISON:  06/24/2023 FINDINGS: Cardiac pacemaker. Cardiac enlargement with pulmonary vascular congestion. Patchy perihilar infiltrates with interstitial changes in the lung bases, likely edema. Small bilateral pleural effusions are decreased since  prior study. No focal consolidation. Mediastinal contours appear intact. Calcification of the aorta. Degenerative changes in the spine and shoulders. Sclerosis in the left humeral head likely represents benign bone island or possibly enchondroma. No change. IMPRESSION: Cardiac enlargement with pulmonary  vascular congestion and interstitial edema. Small bilateral pleural effusions are decreased since prior study. Electronically Signed   By: Boyce Byes M.D.   On: 09/03/2023 16:13   CT ABDOMEN PELVIS WO CONTRAST Result Date: 09/03/2023 CLINICAL DATA:  Acute nonlocalized abdominal pain. Bilateral leg swelling and wounds. EXAM: CT ABDOMEN AND PELVIS WITHOUT CONTRAST TECHNIQUE: Multidetector CT imaging of the abdomen and pelvis was performed following the standard protocol without IV contrast. RADIATION DOSE REDUCTION: This exam was performed according to the departmental dose-optimization program which includes automated exposure control, adjustment of the mA and/or kV according to patient size and/or use of iterative reconstruction technique. COMPARISON:  11/04/2022 FINDINGS: Lower chest: Motion artifact limits evaluation. Mild interstitial changes in the lungs may represent fibrosis or edema. Scattered pulmonary nodules. Largest is on the right, measuring 5 mm diameter. Series 4, image 4. Cardiac enlargement. Small bilateral pleural effusions with basilar atelectasis. Hepatobiliary: Liver parenchymal pattern is somewhat heterogeneous. Suggestion of focal nodules including a hypodense nodule in the dome measuring 1.6 cm and a vague nodular opacity in the inferior right lobe likely measuring around 2 cm diameter. A left lobe nodule is demonstrated in the previous study that is not well seen today without contrast. Nodular changes are better characterized on previous contrast-enhanced studies. Lesions remain indeterminate. Pancreas: Mass in the head and body of the pancreas measuring 4.2 cm diameter. This is better seen on previous contrast-enhanced study and appears grossly unchanged. No pancreatic ductal dilatation. Spleen: Normal in size without focal abnormality. Adrenals/Urinary Tract: No adrenal gland nodules. Surgical absence of the right kidney. Left kidney appears normal. No hydronephrosis, hydroureter,  or stones identified. Bladder is normal. Stomach/Bowel: Stomach, small bowel, and colon are not abnormally distended. No wall thickening or inflammatory changes. Stool throughout the colon. Appendix is not identified. Vascular/Lymphatic: Calcification of the aorta. Aortoiliac stent graft. No aneurysm. Retroperitoneal lymphadenopathy with periaortic nodes measuring up to about 10 mm diameter. This is similar to prior study. Reproductive: No pelvic mass or collections identified. Other: No free air or free fluid in the abdomen. Abdominal wall musculature appears intact. Musculoskeletal: Bilateral total hip replacements. Compression fracture of L2, unchanged since prior study. Degenerative changes in the spine. IMPRESSION: 1. 4.2 cm diameter mass in the pancreatic head/body appears grossly unchanged since prior study. 2. Indeterminate hypodense liver nodules appear unchanged. 3. Small bilateral pleural effusions with basilar atelectasis and interstitial changes. Cardiac enlargement. 4. Multiple pulmonary nodules. Most significant: Right solid pulmonary nodule measuring 5 mm. In the setting of probable cancer, these lesions are indeterminate. Follow-up as per protocol. 5. Aortic atherosclerosis. 6. Nonspecific retroperitoneal lymphadenopathy with lymph nodes measuring up to 10 mm diameter. 7. Surgical absence of the right kidney. Electronically Signed   By: Boyce Byes M.D.   On: 09/03/2023 16:12   CT L-SPINE NO CHARGE Result Date: 09/03/2023 CLINICAL DATA:  Abdominal pain, bilateral leg swelling and wounds EXAM: CT LUMBAR SPINE WITHOUT CONTRAST TECHNIQUE: Multidetector CT imaging of the lumbar spine was performed without intravenous contrast administration. Multiplanar CT image reconstructions were also generated. RADIATION DOSE REDUCTION: This exam was performed according to the departmental dose-optimization program which includes automated exposure control, adjustment of the mA and/or kV according to patient  size and/or use of iterative reconstruction technique.  COMPARISON:  11/04/2022 FINDINGS: Segmentation: 5 lumbar type vertebrae. Alignment: Stable kyphosis centered at a chronic L2 compression fracture. Otherwise alignment is anatomic. Vertebrae: Bones are severely osteopenic. Chronic L2 compression deformity with greater than 75% loss of height and 8 mm of retropulsion, unchanged since previous exam. No acute displaced fractures. No destructive bony lesions. Paraspinal and other soft tissues: The paraspinal soft tissues are unremarkable. Atherosclerosis of the aorta, with bilateral iliac stents noted. Trace bilateral pleural effusions. Please refer to separately reported CT abdomen and pelvis exam for findings in that region. Disc levels: There is mild-to-moderate central canal stenosis at L1-2 and L2-3 related to the prior L2 compression fracture, stable. At L3-4 there is circumferential disc bulge with bilateral facet and ligamentum flavum hypertrophy, resulting in severe central canal stenosis and symmetrical bilateral neural foraminal encroachment. At L4-5 there is partial bony fusion across the disc space again noted. Mild circumferential disc osteophyte complex and bilateral facet hypertrophy contribute to mild central canal stenosis. At L5-S1 there is circumferential disc bulge with bilateral facet and ligamentum flavum hypertrophy resulting in moderate central canal stenosis. Reconstructed images demonstrate no additional findings. IMPRESSION: 1. No acute lumbar spine fracture. 2. Stable chronic L2 compression deformity with greater than 75% loss of height. 3. Severe osteopenia. 4. Multilevel lumbar degenerative changes as above, with central canal stenosis most pronounced at L3-4 and L5-S1. 5. Please refer to separately reported CT abdomen and pelvis exam for findings in that region. Electronically Signed   By: Bobbye Burrow M.D.   On: 09/03/2023 16:08

## 2023-09-16 NOTE — Assessment & Plan Note (Signed)
 Acute on chronic anemia Baseline hemoglobin is 8.1-9.0 over the last 3 weeks 1 unit of PRBC have been ordered for transfusion Patient with history of transfusion reaction Premedications have been ordered by EDP Recheck CBC in a.m.

## 2023-09-16 NOTE — ED Notes (Signed)
 Family has asked to be called prior to starting blood transfusion.  They will come up to sit with her during the infusion because of patient anxiety and her history of transfusion reactions.

## 2023-09-16 NOTE — ED Triage Notes (Addendum)
 Patient to ED via POV for abnormal lab. Hgb 7.3 on Friday. PT reports having more black, tarry since then. Also more weak than normal. Wears 2L [place] at baseline but today is wearing 3L. Has a blood transfusion scheduled for Friday then they couldn't get her in before then. Has pancreatic cancer- receiving chemo shots. Hx of blood transfusion reactions.

## 2023-09-16 NOTE — H&P (Addendum)
 History and Physical   [name]:[id number] DOB: 03-27-1934 DOA: 09/16/2023  PCP: Claire Crick, MD  Outpatient Specialists: Dr. Maryalice Smaller, medical oncology Patient coming from: Home  I have personally briefly reviewed patient's old medical records in [facility] EMR.  Chief Concern: Weakness, abnormal lab  HPI: [name] is a 89 year old female with history of pancreatic neuroendocrine tumor, history of GI bleed with AVMs, CKD stage IV, history of bacteremia, hyperlipidemia, who presents emergency department for chief concerns of acute on chronic anemia.  Vitals in the ED showed temperature 97.6, respiration rate 17, heart rate of 60, blood pressure 107/90, SpO2 100% on 3 L nasal cannula.  Serum sodium is 134, potassium 4.3, chloride 89, bicarb 37, BUN 72, serum creatinine 1.65, EGFR 30, nonfasting blood glucose 110, WBC 4.0, hemoglobin 7.4, platelets of 275.  ED treatment: Famotidine  20 mg IV one-time dose, diphenhydramine  50 mg p.o. one-time dose, furosemide  20 mg IV one-time dose, 1 unit PRBC have been ordered for transfusion.  Patient has history of antibodies, takes a while to find blood. ---------------------------------- At bedside, patient was able to tell me her first and last name, age, location, current calendar year.  She reports that over the last 1-2 weeks she has been having increasing weakness.  She reports that she is generally weak however it was worse in the last couple of days.  She reports feelings of low energy in the last 1 to 2 weeks.  She denies trauma to her person.  She reports that she has been having black stools for the last 4 days however she is also taking iron  tablets.  She reports that she has had black stools like this before and every time this happens she just gets blood transfusion.  She has had history of endoscopy.  She denies fever, chest pain, nausea, vomiting, dysuria, hematuria, diarrhea.  She endorses generalized  chills.  Social history: She lives at home with her grandson.  She is a former tobacco user, quitting 30 years ago.  She denies EtOH and recreational drug use.  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  ROS: Constitutional: no weight change, no fever ENT/Mouth: no sore throat, no rhinorrhea Eyes: no eye pain, no vision changes Cardiovascular: no chest pain, no dyspnea,  no edema, no palpitations Respiratory: no cough, no sputum, no wheezing Gastrointestinal: no nausea, no vomiting, no diarrhea, no constipation Genitourinary: no urinary incontinence, no dysuria, no hematuria Musculoskeletal: no arthralgias, no myalgias Skin: no skin lesions, no pruritus, Neuro: + weakness, no loss of consciousness, no syncope Psych: no anxiety, no depression, no decrease appetite Heme/Lymph: no bruising, no bleeding  ED Course: Discussed with EDP, patient requiring hospitalization for chief concerns of symptomatic acute on chronic anemia.  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  History of bacteremia Patient was discharged home with ciprofloxacin  500 mg daily with breakfast for 4 days.   Patient was to start medication on 09/09/2023 and would have completed last course of antibiotic on 09/12/2023  Upper GI bleed Versus chronic mass bleed Protonix  40 mg IV twice daily initiated on admission I suspect the bleeding is from pancreatic mass  DNR (do not resuscitate) Confirmed patient at bedside Patient states she forgot to bring her DNR paper to the hospital today  History of blood transfusion reaction Per outpatient note on 04/17/2020: Patient needs Tylenol , diphenhydramine , steroids  Hypertension Hydralazine  5 mg IV every 6 hours as needed for SBP greater 165, 5 days ordered  Hyperlipidemia Home rosuvastatin  10 mg nightly resumed  Chart reviewed.   DVT prophylaxis: TED hose; AM team to initiate pharmacologic DVT prophylaxis when the benefits outweigh the risk Code Status: DNR/DNI Diet: Regular diet Family Communication: A phone call was offered, patient declined stating that her family has already been updated Disposition Plan: Pending clinical course; guarded prognosis Consults called: None at this time Admission status: Telemetry medical, observation  Past Medical History:  Diagnosis Date   (HFpEF) heart failure with preserved ejection fraction (HCC)    EF=60-60%   Actinic keratosis 01/17/2015   R forearm   Adjustment disorder with anxiety    Adverse effect of other narcotics, sequela    Intolerance to all narcotics   Anti-Duffy antibodies present    Aortic valve stenosis    Arthritis    "some in my hands" (11/11/2012)   Atrial fibrillation, permanent (HCC)    Eliquis    Atypical mole 03/25/2018   L forearm - severe   Automatic implantable cardioverter-defibrillator in situ    Avascular necrosis of hip (HCC) 05/03/2011   Carotid artery stenosis 09/2007   a. 09/2007: 60-79% bilateral (stable); b. 10/2008: 40-59% R 60-79%    Cellulitis of left lower extremity 07/13/2020   CKD (chronic kidney disease), stage III (HCC)    COPD (chronic obstructive pulmonary  disease) (HCC)    Coronary artery disease    non-obstructive by 2006 cath   COVID-19 virus infection 02/12/2022   Displaced fracture of left femoral neck (HCC) 10/09/2018   GI bleed 03/28/2020   AVM   High cholesterol    Hypertension 05/20/2011   Hypertr obst cardiomyop    Hypotension, unspecified    cardiac cath 2006..nonobstructive CAD 30-40s lesions.Aaron AasETT 1/09 nondiagnostic due to poor HR response..Right Renal Cancer 2003   Iron  deficiency anemia    Long term (current) use of anticoagulants    On home oxygen  therapy    3L [place]   Osteoarthritis of right hip    PAD (peripheral artery disease) (HCC)    Pancreatic cancer (HCC)    sandostatin    PONV (postoperative nausea and vomiting)    Presence of permanent cardiac pacemaker    Pulmonary HTN (HCC)    RECTAL BLEEDING 10/13/2009   Qualifier: Diagnosis of  By: Daphane Dynes NP, Paula     Red blood cell antibody positive with compatible PRBC difficult to obtain    Anti FYA (Duffy a) antibody. Must be transfused with PRB which are Duffy Antigen Negative and Crossmatch Compatible   Renal cell carcinoma (HCC)    s/p nephrectomy   Squamous cell carcinoma of skin 01/17/2015   R lat wrist   Squamous cell carcinoma of skin 01/29/2018   R post  upper leg - superficially invasive   Squamous cell carcinoma of skin 03/25/2018   L lat foot   Squamous cell carcinoma of skin 11/09/2020   left lat foot - EDC 01/01/21, recurrent 01/31/21 - MOHs 02/22/21   Squamous cell carcinoma of skin 12/19/2021   Right Posterior Medial Thigh, EDC   Squamous cell carcinoma of skin 12/19/2021   SCC IS, L lat heel, EDC 02/04/2022   Squamous cell carcinoma of skin 01/21/2022   L forearm, EDC 02/04/2022   Squamous cell carcinoma of skin 01/21/2022   R lower leg below knee, EDC 02/04/2022   Squamous cell carcinoma of skin 01/21/2022   SCCIS, R post heel, EDC 02/04/2022   Squamous cell carcinoma of skin 07/01/2022   left lower abdomen, in situ, EDC   Squamous cell  carcinoma of skin 07/01/2022   left medial chest, EDC   Urge incontinence    Venous stasis of both lower extremities    Past Surgical History:  Procedure Laterality Date   ABDOMINAL AORTOGRAM W/LOWER EXTREMITY N/A 02/12/2021   Procedure: ABDOMINAL AORTOGRAM W/LOWER EXTREMITY;  Surgeon: Adine Hoof, MD;  Location: [facility] INVASIVE CV LAB;  Service: Cardiovascular;  Laterality: N/A;   ABDOMINAL HYSTERECTOMY  1975   for benign causes   APPENDECTOMY     BI-VENTRICULAR PACEMAKER UPGRADE  05/04/2010   BIOPSY  02/28/2019   Procedure: BIOPSY;  Surgeon: Janel Medford, MD;  Location: [facility] ENDOSCOPY;  Service: Endoscopy;;   BIOPSY  03/04/2022   Procedure: BIOPSY;  Surgeon: Normie Becton., MD;  Location: [facility] ENDOSCOPY;  Service: Gastroenterology;;   CARDIAC CATHETERIZATION  2006   CARDIOVERSION N/A 02/20/2018   Procedure: CARDIOVERSION;  Surgeon: Devorah Fonder, MD;  Location: [facility] ORS;  Service: Cardiovascular;  Laterality: N/A;   CARDIOVERSION N/A 03/27/2018   Procedure: CARDIOVERSION;  Surgeon: Devorah Fonder, MD;  Location: [facility] ORS;  Service: Cardiovascular;  Laterality: N/A;   CATARACT EXTRACTION W/ INTRAOCULAR LENS  IMPLANT, BILATERAL  01/2006-02-2006   CHOLECYSTECTOMY N/A 11/11/2012   Procedure: LAPAROSCOPIC CHOLECYSTECTOMY WITH INTRAOPERATIVE CHOLANGIOGRAM;  Surgeon: Kari Otto. Eli Grizzle, MD;  Location: [facility] OR;  Service: General;  Laterality: N/A;   COLONOSCOPY WITH PROPOFOL  N/A 02/28/2019   Procedure: COLONOSCOPY WITH PROPOFOL ;  Surgeon: Janel Medford, MD;  Location: [facility] ENDOSCOPY;  Service: Endoscopy;  Laterality: N/A;   ENTEROSCOPY N/A 03/30/2020   Procedure: ENTEROSCOPY;  Surgeon: Annis Kinder, DO;  Location: [facility] ENDOSCOPY;  Service: Gastroenterology;  Laterality: N/A;   ENTEROSCOPY N/A 02/10/2021   Procedure: ENTEROSCOPY;  Surgeon: Alvis Jourdain, MD;  Location: [facility] ENDOSCOPY;  Service: Endoscopy;  Laterality: N/A;   ENTEROSCOPY N/A 02/15/2022   Procedure:  ENTEROSCOPY;  Surgeon: Daina Drum, MD;  Location: [facility] ENDOSCOPY;  Service: Gastroenterology;  Laterality: N/A;   ENTEROSCOPY N/A 11/06/2022   Procedure: ENTEROSCOPY;  Surgeon: Brice Campi Albino Alu., MD;  Location: [facility] ENDOSCOPY;  Service: Gastroenterology;  Laterality: N/A;   EP IMPLANTABLE DEVICE N/A 02/21/2016   Procedure: ICD Generator Changeout;  Surgeon: Verona Goodwill, MD;  Location: [facility] INVASIVE CV LAB;  Service: Cardiovascular;  Laterality: N/A;   ESOPHAGOGASTRODUODENOSCOPY N/A 03/04/2022   Procedure: ESOPHAGOGASTRODUODENOSCOPY (EGD);  Surgeon: Normie Becton., MD;  Location: [facility] ENDOSCOPY;  Service: Gastroenterology;  Laterality: N/A;   ESOPHAGOGASTRODUODENOSCOPY (EGD) WITH PROPOFOL  N/A 02/28/2019   Procedure: ESOPHAGOGASTRODUODENOSCOPY (EGD) WITH PROPOFOL ;  Surgeon: Janel Medford, MD;  Location: [facility] ENDOSCOPY;  Service: Endoscopy;  Laterality: N/A;   EUS N/A 03/04/2022   Procedure: UPPER ENDOSCOPIC ULTRASOUND (EUS) RADIAL;  Surgeon: Normie Becton., MD;  Location: [facility] ENDOSCOPY;  Service: Gastroenterology;  Laterality: N/A;   FINE NEEDLE ASPIRATION N/A 03/04/2022   Procedure: FINE NEEDLE ASPIRATION (FNA) LINEAR;  Surgeon: Normie Becton., MD;  Location: [facility] ENDOSCOPY;  Service: Gastroenterology;  Laterality: N/A;   GIVENS CAPSULE STUDY N/A 03/15/2019   Procedure: GIVENS CAPSULE STUDY;  Surgeon: Lindle Rhea, MD;  Location: [facility] ENDOSCOPY;  Service: Gastroenterology;  Laterality: N/A;   HOT HEMOSTASIS N/A 03/30/2020   Procedure: HOT HEMOSTASIS (ARGON PLASMA COAGULATION/BICAP);  Surgeon: Annis Kinder, DO;  Location: [facility] ENDOSCOPY;  Service: Gastroenterology;  Laterality: N/A;   HOT HEMOSTASIS N/A 02/10/2021   Procedure: HOT HEMOSTASIS (ARGON PLASMA COAGULATION/BICAP);  Surgeon: Alvis Jourdain, MD;  Location: [facility] ENDOSCOPY;  Service: Endoscopy;  Laterality: N/A;   HOT HEMOSTASIS N/A 02/15/2022   Procedure: HOT HEMOSTASIS (ARGON PLASMA COAGULATION/BICAP);   Surgeon: Daina Drum, MD;  Location: [facility] ENDOSCOPY;  Service: Gastroenterology;  Laterality: N/A;   HOT HEMOSTASIS N/A 11/06/2022   Procedure: HOT HEMOSTASIS (ARGON PLASMA COAGULATION/BICAP);  Surgeon: Normie Becton., MD;  Location: [facility] ENDOSCOPY;  Service: Gastroenterology;  Laterality: N/A;   INSERT / REPLACE / REMOVE PACEMAKER  05-01-11   02-28-05-/05-04-10-ICD-MEDTRONIC MAXIMAL DR   IR ANGIOGRAM SELECTIVE EACH ADDITIONAL VESSEL  02/18/2022   IR ANGIOGRAM SELECTIVE EACH ADDITIONAL VESSEL  02/18/2022   IR ANGIOGRAM VISCERAL SELECTIVE  02/16/2022   IR ANGIOGRAM VISCERAL SELECTIVE  02/16/2022   IR EMBO ART  VEN HEMORR LYMPH EXTRAV  INC GUIDE ROADMAPPING  02/16/2022   IR THORACENTESIS ASP PLEURAL SPACE W/IMG GUIDE  02/21/2022   IR THORACENTESIS ASP PLEURAL SPACE W/IMG GUIDE  02/22/2022   IR US  GUIDE VASC ACCESS RIGHT  02/16/2022   JOINT REPLACEMENT     LAPAROSCOPIC CHOLECYSTECTOMY  11/11/2012   LAPAROSCOPIC LYSIS OF ADHESIONS N/A 11/11/2012   Procedure: LAPAROSCOPIC LYSIS OF ADHESIONS;  Surgeon: Kari Otto. Eli Grizzle, MD;  Location: [facility] OR;  Service: General;  Laterality: N/A;   NEPHRECTOMY Right 06/2001    S/P RENAL CELL CANCER   PERIPHERAL VASCULAR INTERVENTION Bilateral 02/12/2021   Procedure: PERIPHERAL VASCULAR INTERVENTION;  Surgeon: Adine Hoof, MD;  Location[facility] INVASIVE CV LAB;  Service: Cardiovascular;  Laterality: Bilateral;  Iliac artery stents   PRESSURE SENSOR/CARDIOMEMS N/A 02/03/2019   Procedure: PRESSURE SENSOR/CARDIOMEMS;  Surgeon: Darlis Eisenmenger, MD;  Location: [facility] INVASIVE CV LAB;  Service: Cardiovascular;  Laterality: N/A;   RIGHT HEART CATH N/A 11/09/2018   Procedure: RIGHT HEART CATH;  Surgeon: Darlis Eisenmenger, MD;  Location: [facility] INVASIVE CV LAB;  Service: Cardiovascular;  Laterality: N/A;   RIGHT HEART CATH N/A 03/08/2019   Procedure: RIGHT HEART CATH;  Surgeon: Darlis Eisenmenger, MD;  Location: [facility] INVASIVE CV LAB;  Service: Cardiovascular;  Laterality: N/A;    SUBMUCOSAL TATTOO INJECTION  02/28/2019   Procedure: SUBMUCOSAL TATTOO INJECTION;  Surgeon: Janel Medford, MD;  Location: [facility] ENDOSCOPY;  Service: Endoscopy;;   SUBMUCOSAL TATTOO INJECTION  11/06/2022   Procedure: SUBMUCOSAL TATTOO INJECTION;  Surgeon: Normie Becton., MD;  Location: [facility] ENDOSCOPY;  Service: Gastroenterology;;   TOTAL HIP ARTHROPLASTY Right 05/03/2011   Procedure: TOTAL HIP ARTHROPLASTY ANTERIOR APPROACH;  Surgeon: Arnie Lao;  Location: [facility] ORS;  Service: Orthopedics;  Laterality: Right;  Removal of Cannulated Screws Right Hip, Right Direct Anterior Hip Replacement   TOTAL HIP ARTHROPLASTY Left 10/09/2018   Procedure: TOTAL HIP ARTHROPLASTY ANTERIOR APPROACH;  Surgeon: Adonica Hoose, MD;  Location: [facility] OR;  Service: Orthopedics;  Laterality: Left;  Social History:  reports that she quit smoking about 22 years ago. Her smoking use included cigarettes. She started smoking about 62 years ago. She has a 20 pack-year smoking history. She has never used smokeless tobacco. She reports that she does not drink alcohol and does not use drugs.  Allergies  Allergen Reactions   Codeine Nausea And Vomiting and Other (See Comments)    Hallucinations, too- CANNOT HAVE; thinks she may have received Narcan  to reverse the effect   Dilaudid  [Hydromorphone ] Other (See Comments)    Excessive Somnolence- Required Narcan  X2   Tape Other (See Comments)    CAN TOLERATE ONLY EASY-RELEASE, PAPER TAPE, AS THE SKIN IS VERY THIN AND WILL BRUISE AND TEAR VERY EASILY   Amoxil  [Amoxicillin ] Nausea Only   Asa [Aspirin ] Other (See Comments)    Told to avoid due to reduced kidney function, has 1 remaining kidney   Ms Contin  [Morphine ] Nausea And Vomiting and Other (See Comments)    Hallucinations, also   Neurontin  [Gabapentin ] Other (See Comments)    "Out of it"   Nsaids Other (See Comments)    Told to avoid due to reduced kidney function, has 1 remaining kidney   Family History  Problem  Relation Age of Onset   Heart failure Mother    Early death Father        car accident   Breast cancer Maternal Aunt 62   Breast cancer Cousin    Breast cancer Other    Colon cancer Neg Hx    Esophageal cancer Neg Hx    Liver disease Neg Hx    Pancreatic cancer Neg Hx    Rectal cancer Neg Hx    Family history: Family history reviewed and not pertinent.  Prior to Admission medications   Medication Sig Start Date End Date Taking? Authorizing Provider  acetaminophen  (TYLENOL ) 500 MG tablet Take 1,000 mg by mouth every 8 (eight) hours.    [provider]  Ascorbic Acid  (VITAMIN C  PO) Take 1 tablet by mouth daily.    [provider]  Cholecalciferol  (VITAMIN D -3 PO) Take 2 capsules by mouth daily.    [provider]  clotrimazole -betamethasone  (LOTRISONE ) cream Apply topically 2 (two) times daily. 09/08/23   Althia Atlas, MD  CRANBERRY PO Take 1 tablet by mouth daily.    [provider]  Cyanocobalamin  (B-12 PO) Take 1 tablet by mouth daily.    [provider]  ferrous gluconate  (FERGON) 324 MG tablet Take 1 tablet (324 mg total) by mouth daily with breakfast. 11/15/22   Rai, Hurman Maiden, MD  fluticasone  (FLONASE ) 50 MCG/ACT nasal spray Place 2 sprays into both nostrils daily as needed for allergies or rhinitis. 07/04/22   Claire Crick, MD  methocarbamol  (ROBAXIN ) 500 MG tablet Take 1 tablet (500 mg total) by mouth every 8 (eight) hours as needed for muscle spasms. 09/08/23   Althia Atlas, MD  metolazone  (ZAROXOLYN ) 2.5 MG tablet Take 1 tablet (2.5 mg total) by mouth 3 (three) times a week. Every Tues., Thurs and Sat. with 40 meq of potassium 07/25/23   Lauralee Poll, MD  Multiple Vitamins-Minerals (HAIR SKIN NAILS PO) Take 1 capsule by mouth daily.    [provider]  pantoprazole  (PROTONIX ) 40 MG tablet Take 1 tablet (40 mg total) by mouth daily. 11/09/22   Rai, Hurman Maiden, MD  Potassium Chloride  ER 20 MEQ TBCR Take 1 tablet (20 mEq  total) by mouth daily. 07/24/23 10/22/23  Lauralee Poll, MD  rosuvastatin  (CRESTOR ) 10 MG tablet Take 1 tablet (10 mg total) by mouth daily. 06/20/23   Brenna Cam, MD  torsemide  (DEMADEX ) 20 MG tablet Take 5 tablets (100 mg total) by mouth 2 (two) times daily. 07/10/23   Darlis Eisenmenger, MD  traMADol  (ULTRAM ) 50 MG tablet Take 1 tablet (50 mg total) by mouth every 8 (eight) hours as needed for up to 5 days. Stop oxycodone . 09/11/23 09/16/23  Donnie Galea, MD   Physical Exam: Vitals:   09/16/23 1830 09/16/23 2000 09/16/23 2030 09/16/23 2111  BP: (!) 109/97 124/66 (!) 131/40 118/73  Pulse: 60 64 60 (!) 58  Resp: 18 20  20   Temp:  97.7 F (36.5 C)  97.7 F (36.5 C)  TempSrc:  Oral  Oral  SpO2: 100% 100% 100% 98%  Weight:      Height:       Constitutional: appears frail, cachectic appearing, calm Eyes: PERRL, lids and conjunctivae normal ENMT: Mucous membranes are moist. Posterior pharynx clear of any exudate or lesions. Age-appropriate dentition. Hearing appropriate Neck: normal, supple, no masses, no thyromegaly Respiratory: clear to auscultation bilaterally, no wheezing, no crackles. Normal respiratory effort. No accessory muscle use.  Cardiovascular: Regular rate and rhythm, no murmurs / rubs / gallops. No extremity edema. 2+ pedal pulses. No carotid bruits.  Abdomen: no tenderness, no masses palpated, no hepatosplenomegaly. Bowel sounds positive.  Musculoskeletal: no clubbing / cyanosis. No joint deformity upper and lower extremities. Good ROM, no contractures, no atrophy. Normal muscle tone.  Skin: Multiple lower extremity skin wounds with necrotic tissue    Neurologic: Sensation intact. Strength 5/5 in all 4.  Psychiatric: Normal judgment and insight. Alert and oriented x 3. Normal mood.   EKG: Not indicated at this time  Chest x-ray on Admission: Not indicated at this time  Labs on Admission: I have personally reviewed following labs CBC: Recent Labs  Lab  09/12/23 1252 09/16/23 1453  WBC 4.2 4.0  NEUTROABS 3.3  --   HGB 7.3 Repeated and verified X2.* 7.4*  HCT 24.0 Repeated and verified X2.* 26.2*  MCV 75.9* 81.4  PLT 214.0 275   Basic Metabolic Panel: Recent Labs  Lab 09/12/23 1252 09/16/23 1453  NA 135 134*  K 4.2 4.5  CL 91* 89*  CO2 34* 37*  GLUCOSE 130* 110*  BUN 74* 72*  CREATININE 1.75* 1.65*  CALCIUM  9.4 9.4   GFR: Estimated Creatinine Clearance: 22.2 mL/min (A) (by C-G formula based on SCr of 1.65 mg/dL (H)).  Liver Function Tests: Recent Labs  Lab 09/16/23 1453  AST 20  ALT 12  ALKPHOS 61  BILITOT 1.1  PROT 7.8  ALBUMIN  3.6   Urine analysis:    Component Value Date/Time   COLORURINE STRAW (A) 09/03/2023 1534   APPEARANCEUR CLEAR (A) 09/03/2023 1534   APPEARANCEUR Cloudy (A) 01/27/2018 1330   LABSPEC 1.009 09/03/2023 1534   PHURINE 5.0 09/03/2023 1534   GLUCOSEU NEGATIVE 09/03/2023 1534   HGBUR NEGATIVE 09/03/2023 1534   HGBUR large 11/16/2009 1202   BILIRUBINUR NEGATIVE 09/03/2023 1534   BILIRUBINUR Negative 11/08/2021 1237   BILIRUBINUR Negative 01/27/2018 1330   KETONESUR NEGATIVE 09/03/2023 1534   PROTEINUR NEGATIVE 09/03/2023 1534   UROBILINOGEN 0.2 11/08/2021 1237   UROBILINOGEN 1.0 08/16/2014 1754   NITRITE NEGATIVE 09/03/2023 1534   LEUKOCYTESUR NEGATIVE 09/03/2023 1534   This document was prepared using Dragon Voice Recognition software and may include unintentional dictation errors.  Dr. Reinhold Carbine [facility]  If 7PM-7AM, please contact  overnight-coverage provider If 7AM-7PM, please contact day attending provider [email or web address]  09/16/2023, 9:46 PM

## 2023-09-16 NOTE — Assessment & Plan Note (Addendum)
 Per outpatient note on 04/17/2020: Patient needs Tylenol , diphenhydramine , steroids

## 2023-09-16 NOTE — Assessment & Plan Note (Signed)
 Confirmed patient at bedside Patient states she forgot to bring her DNR paper to the hospital today

## 2023-09-16 NOTE — ED Provider Notes (Signed)
 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  Patient has had some ongoing weakness since her admission earlier this month.  They had a follow-up appointment with their primary care doctor on Friday where patient's hemoglobin resulted at 7.3.  She has had tarry stool which is not atypical for her but slightly increased with worsening weakness so she presents today given concerns for worsening anemia.  I reviewed her discharge summary from 09/08/2023.  At that time patient presented with cellulitis with associated bacteremia noted to have anemia at that time.  Family reports blood hemoglobin was 8.1 prior to discharge.  I reviewed her oncology note from 07/28/2023.  That nodes a hemoglobin goal of 8 with recommendation of transfusion of 1 unit of PRBCs for hemoglobin between 7 and 8.  Care coordination note also recommends premedication with Benadryl  50 mg p.o., famotidine  20 mg IV, Solu-Medrol  125 mg IV, Tylenol  650, Lasix  20 IV.       Physical Exam   Triage Vital Signs: ED Triage Vitals  Encounter Vitals Group     BP 09/16/23 1450 (!) 107/90     Systolic BP Percentile --      Diastolic BP Percentile --      Pulse Rate 09/16/23 1450 60     Resp 09/16/23 1450 17     Temp 09/16/23 1450 97.6 F (36.4 C)     Temp Source 09/16/23 1450 Oral     SpO2 09/16/23 1450 100 %     Weight 09/16/23 1450 129 lb (58.5 kg)     Height 09/16/23 1450 5\' 6"  (1.676 m)     Head Circumference --      Peak Flow --      Pain Score 09/16/23 1449 7     Pain Loc --      Pain Education --      Exclude from Growth Chart --     Most recent vital  signs: Vitals:   09/16/23 1800 09/16/23 1830  BP: (!) 106/40 (!) 109/97  Pulse: 100 60  Resp: 16 18  Temp:    SpO2: 90% 100%     General: Awake, interactive  CV:  Regular rate, good peripheral perfusion.  Resp:  Unlabored respirations, lungs good auscultation Abd:  Nondistended, soft, no significant tenderness Neuro:  Symmetric facial movement, fluid speech, moving extremity spontaneously   ED Results / Procedures / Treatments   Labs (all labs ordered are listed, but only abnormal results are displayed) Labs Reviewed  COMPREHENSIVE METABOLIC PANEL WITH GFR - Abnormal; Notable for the following components:      Result Value   Sodium 134 (*)    Chloride 89 (*)    CO2 37 (*)    Glucose, Bld 110 (*)    BUN 72 (*)    Creatinine, Ser 1.65 (*)    GFR, Estimated 30 (*)    All other components within normal limits  CBC - Abnormal; Notable for the following components:   RBC 3.22 (*)    Hemoglobin 7.4 (*)    HCT 26.2 (*)    MCH 23.0 (*)    MCHC 28.2 (*)  RDW 20.6 (*)    All other components within normal limits  BASIC METABOLIC PANEL WITH GFR  CBC  TYPE AND SCREEN  PREPARE RBC (CROSSMATCH)     EKG EKG independently reviewed interpreted by myself (ER attending) demonstrates:    RADIOLOGY Imaging independently reviewed and interpreted by myself demonstrates:   Formal Radiology Read:  No results found.  PROCEDURES:  Critical Care performed: Yes, see critical care procedure note(s)  CRITICAL CARE Performed by: Claria Crofts   Total critical care time: 31 minutes  Critical care time was exclusive of separately billable procedures and treating other patients.  Critical care was necessary to treat or prevent imminent or life-threatening deterioration.  Critical care was time spent personally by me on the following activities: development of treatment plan with patient and/or surrogate as well as nursing, discussions with consultants, evaluation of patient's  response to treatment, examination of patient, obtaining history from patient or surrogate, ordering and performing treatments and interventions, ordering and review of laboratory studies, ordering and review of radiographic studies, pulse oximetry and re-evaluation of patient's condition.   Procedures   MEDICATIONS ORDERED IN ED: Medications  diphenhydrAMINE  (BENADRYL ) capsule 50 mg (has no administration in time range)  famotidine  (PEPCID ) IVPB 20 mg premix (has no administration in time range)  methylPREDNISolone  sodium succinate (SOLU-MEDROL ) 125 mg/2 mL injection 125 mg (has no administration in time range)  acetaminophen  (TYLENOL ) tablet 650 mg (has no administration in time range)  furosemide  (LASIX ) injection 20 mg (has no administration in time range)  0.9 %  sodium chloride  infusion (has no administration in time range)  acetaminophen  (TYLENOL ) tablet 650 mg (has no administration in time range)    Or  acetaminophen  (TYLENOL ) suppository 650 mg (has no administration in time range)  ondansetron  (ZOFRAN ) tablet 4 mg (has no administration in time range)    Or  ondansetron  (ZOFRAN ) injection 4 mg (has no administration in time range)  senna-docusate (Senokot-S) tablet 1 tablet (has no administration in time range)  pantoprazole  (PROTONIX ) injection 40 mg (has no administration in time range)     IMPRESSION / MDM / ASSESSMENT AND PLAN / ED COURSE  I reviewed the triage vital signs and the nursing notes.  Differential diagnosis includes, but is not limited to, acute on chronic anemia secondary to known pancreatic tumor, AVM with chronic bleeding, electrolyte abnormality, dehydration, low suspicion recurrent infection based on clinical history  Patient's presentation is most consistent with acute presentation with potential threat to life or bodily function.  89 year old female presenting with generalized weakness.  Stable vitals on presentation.  Labs with stable anemia at 7.4,  but at criteria for transfusion based on oncology recommendations and patient reporting she is symptomatic with generalized weakness.  CMP with stable elevated creatinine.  Patient does have a history of multiple transfusion reactions in the past.  I did review oncology recommendations for transfusion.  Also has a blood product refusal documented, but both patient reports that she does currently wish to be transfused blood just in accordance with her oncology recommendations.  Her premedications were ordered to be transfused 30 minutes prior to blood.  She was ordered to transfuse 1 unit of PRBCs.  With her history of CHF, this will transfuse over 4 hours.  I did discuss admission given likely prolonged transfusion process and patient is agreeable.  Will reach out to hospitalist team.  Case discussed with hospitalist team.  They will evaluate for anticipated admission.      FINAL CLINICAL IMPRESSION(S) /  ED DIAGNOSES   Final diagnoses:  Acute on chronic anemia  Gastrointestinal hemorrhage, unspecified gastrointestinal hemorrhage type     Rx / DC Orders   ED Discharge Orders     None        Note:  This document was prepared using Dragon voice recognition software and may include unintentional dictation errors.   Claria Crofts, MD 09/16/23 [phone]

## 2023-09-16 NOTE — ED Notes (Signed)
 Pt returned to bed without assistance, connected to monitor. Pt given warm blankets.

## 2023-09-17 ENCOUNTER — Inpatient Hospital Stay: Admitting: Nurse Practitioner

## 2023-09-17 ENCOUNTER — Other Ambulatory Visit

## 2023-09-17 ENCOUNTER — Inpatient Hospital Stay

## 2023-09-17 ENCOUNTER — Ambulatory Visit

## 2023-09-17 ENCOUNTER — Ambulatory Visit: Admitting: Nurse Practitioner

## 2023-09-17 DIAGNOSIS — D649 Anemia, unspecified: Secondary | ICD-10-CM | POA: Diagnosis not present

## 2023-09-17 LAB — BASIC METABOLIC PANEL WITH GFR
Anion gap: 11 (ref 5–15)
BUN: 73 mg/dL — ABNORMAL HIGH (ref 8–23)
CO2: 32 mmol/L (ref 22–32)
Calcium: 9 mg/dL (ref 8.9–10.3)
Chloride: 93 mmol/L — ABNORMAL LOW (ref 98–111)
Creatinine, Ser: 1.71 mg/dL — ABNORMAL HIGH (ref 0.44–1.00)
GFR, Estimated: 29 mL/min — ABNORMAL LOW (ref >60)
Glucose, Bld: 154 mg/dL — ABNORMAL HIGH (ref 70–99)
Potassium: 4.1 mmol/L (ref 3.5–5.1)
Sodium: 136 mmol/L (ref 135–145)

## 2023-09-17 LAB — CBC
HCT: 31.2 % — ABNORMAL LOW (ref 36.0–46.0)
Hemoglobin: 9.2 g/dL — ABNORMAL LOW (ref 12.0–15.0)
MCH: 23.7 pg — ABNORMAL LOW (ref 26.0–34.0)
MCHC: 29.5 g/dL — ABNORMAL LOW (ref 30.0–36.0)
MCV: 80.4 fL (ref 80.0–100.0)
Platelets: 311 10*3/uL (ref 150–400)
RBC: 3.88 MIL/uL (ref 3.87–5.11)
RDW: 19.9 % — ABNORMAL HIGH (ref 11.5–15.5)
WBC: 20.5 10*3/uL — ABNORMAL HIGH (ref 4.0–10.5)
nRBC: 0 % (ref 0.0–0.2)

## 2023-09-17 NOTE — Progress Notes (Signed)
 Pre-transfusion medications administered per order frequency and time prior to transfusion.  1 unit prbc administered, granddaughter at bedside prior to unit infusing.  Vs remain stable, no n/v, no uneventful reaction, patient tolerated procedure well.

## 2023-09-17 NOTE — Consult Note (Signed)
 WOC Nurse Consult Note: patient known to [facility] team from previous admissions with venous insufficiency and resultant venous stasis ulcers; patient was seen by ID and photo documentation of wounds 09/16/23 Admitted to [facility] yesterday due to low HGB, anemia and need for blood transfusion.   Reason for Consult: L lower cellulitis  Wound type: full thickness r/t venous insufficiency  Pressure Injury POA: NA  Measurement: see nursing flowsheet  Wound bed:R posterior leg noted to have ulceration with yellow brown dry necrotic tissue; L anterior brown black necrotic tissue, L lateral aspect foot yellow necrotic tissue; R dorsal foot hyperkeratotic tissue;  Drainage (amount, consistency, odor) weeping per MD note  Periwound: edema, erythema and hyperkeratotic skin  Dressing procedure/placement/frequency:  Cleanse R leg (intact skin and ulcerations) with Vashe wound cleanser Timm Foot [phone]), do not rinse and allow to air dry.  Apply Xeroform gauze Timm Foot [phone]) to anterior and posterior R leg, secure with ABD pad and Kerlix roll gauze beginning right above toes and ending right below knee.  Apply Ace bandage wrapped in same fashion as Kerlix for light compression.  Cleanse L lower leg (intact skin and ulcerations) with Vashe wound cleanser, apply Medihoney to open necrotic wound beds daily, cover with dry gauze and ABD pads. Secure with Kerlix roll gauze beginning right above toes and ending right below knee.  Apply Ace bandage wrapped in same fashion as Kerlix for light compression.  Patient has been referred to [facility] but unclear if appointment was made.   Patient needs that appointment for ongoing management of leg edema and leg wounds.  Reached out to case management for appropriateness of HH at discharge.  Will not follow at this time.  Please re-consult if needed.  Branda Cain MSN, RN, FNP-BC CWON Wound, Ostomy, Continence Nurse Outpatient [facility] [phone] Pager [phone]

## 2023-09-17 NOTE — Progress Notes (Signed)
 Pt AVS reviewed and pt verbalized understanding of all DC teaching and instructions. Pt has all of their belongings in their posession. Going home with daughter as transportation.

## 2023-09-17 NOTE — Discharge Summary (Signed)
 TRENACE SESSOMS ZOX:[id number] DOB: Mar 14, 1934 DOA: 09/16/2023  PCP: Claire Crick, MD  Admit date: 09/16/2023 Discharge date: 09/17/2023  Time spent: 35 minutes  Recommendations for Outpatient Follow-up:  Close oncology f/u     Discharge Diagnoses:  Principal Problem:   Anemia Active Problems:   Primary pancreatic neuroendocrine tumor   TIA (transient ischemic attack)   Hyperlipidemia   Atrial fibrillation, permanent (HCC)   COPD (chronic obstructive pulmonary disease) (HCC)   ICD (implantable cardioverter-defibrillator) in place   Hypertension   PAD (peripheral artery disease) (HCC)   Pulmonary hypertension (HCC)   Angiodysplasia of small intestine   History of blood transfusion reaction   Chronic hypoxic respiratory failure, on home oxygen  therapy (HCC)   DNR (do not resuscitate)   Renal cell carcinoma s/p nephrectomy 2003(HCC)   (HFpEF) heart failure with preserved ejection fraction (HCC)   FTT (failure to thrive) in adult   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   Vitals in the ED showed temperature 97.6, respiration rate 17, heart rate of 60, blood pressure 107/90, SpO2 100% on 3 L nasal cannula.   Serum sodium is 134, potassium 4.3, chloride 89, bicarb 37, BUN 72, serum creatinine 1.65, EGFR 30, nonfasting blood glucose 110, WBC 4.0, hemoglobin 7.4, platelets of 275.   ED treatment: Famotidine  20 mg IV one-time dose, diphenhydramine  50 mg p.o. one-time dose, furosemide  20 mg IV one-time dose,  1 unit PRBC have been ordered for transfusion.  Patient has history of antibodies, takes a while to find blood. ---------------------------------- At bedside, patient was able to tell me her first and last name, age, location, current calendar year.   She reports that over the last 1-2 weeks she has been having increasing weakness.  She reports that she is generally weak however it was worse in the last couple of days.  She reports feelings of low energy in the last 1 to 2 weeks.   She denies trauma to her person.  She reports that she has been having black stools for the last 4 days however she is also taking iron  tablets.  She reports that she has had black stools like this before and every time this happens she just gets blood transfusion.  She has had history of endoscopy.   She denies fever, chest pain, nausea, vomiting, dysuria, hematuria, diarrhea.  She endorses generalized chills.   Social history: She lives at home with her grandson.  She is a former tobacco user, quitting 30 years ago.  She denies EtOH and recreational drug use.  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  Hospital Course:  Patient presents referred from PCP for anemia. Mildly symptomatic. Recent baseline in the 8s, here 7.3, improved to 9.2 with 1 unit prbc, tolerated that well. Does have intermittent dark stool but she attributes this to oral iron . Chronic GI bleeding thought to be due to AVMs. She declines endoscopic evaluation and requests discharge. Multiple severe underlying comorbidities, she has had hospice discussions with previous providers and we  engaged in such discussion again, she declines this for the time being. Plan will be close follow up with oncology. Has been started on bevacizumab  outpatient, receiving esa every 2 weeks, iv iron , as needed blood transfusions.   Procedures: Blood transfusion   Consultations: none  Discharge Exam: Vitals:   09/17/23 0557 09/17/23 0824  BP: (!)  118/55 129/67  Pulse: 60 60  Resp: 16 16  Temp: 98.2 F (36.8 C) 97.9 F (36.6 C)  SpO2: 100% 100%    General: NAD Cardiovascular: systolic murmur, rr Respiratory: rales at bases otherwise clear Ext: lichenification and crusting  Discharge Instructions   Discharge Instructions     Diet - low sodium heart healthy   Complete by: As directed    Increase activity slowly   Complete by: As directed       Allergies as of 09/17/2023       Reactions   Codeine Nausea And Vomiting, Other (See Comments)   Hallucinations, too- CANNOT HAVE; thinks she may have received Narcan  to reverse the effect   Dilaudid  [hydromorphone ] Other (See Comments)   Excessive Somnolence- Required Narcan  X2   Tape Other (See Comments)   CAN TOLERATE ONLY EASY-RELEASE, PAPER TAPE, AS THE SKIN IS VERY THIN AND WILL BRUISE AND TEAR VERY EASILY   Amoxil  [amoxicillin ] Nausea Only   Asa [aspirin ] Other (See Comments)   Told to avoid due to reduced kidney function, has 1 remaining kidney   Ms Contin  [morphine ] Nausea And Vomiting, Other (See Comments)   Hallucinations, also   Neurontin  [gabapentin ] Other (See Comments)   "Out of it"   Nsaids Other (See Comments)   Told to avoid due to reduced kidney function, has 1 remaining kidney        Medication List     STOP taking these medications    traMADol  50 MG tablet Commonly known as: ULTRAM        TAKE these medications    acetaminophen  500 MG tablet Commonly known as: TYLENOL  Take 1,000 mg by mouth every 8 (eight) hours.   B-12 PO Take 1 tablet by mouth daily.   clotrimazole -betamethasone  cream Commonly known as: LOTRISONE  Apply topically 2 (two) times daily.   CRANBERRY PO Take 1 tablet by mouth daily.   ferrous gluconate  324 MG tablet Commonly known as: FERGON Take 1 tablet (324 mg total) by mouth daily with breakfast.   fluticasone  50 MCG/ACT nasal spray Commonly known as: FLONASE  Place 2 sprays into both nostrils daily as  needed for allergies or rhinitis.   HAIR SKIN NAILS PO Take 1 capsule by mouth daily.   methocarbamol  500 MG tablet Commonly known as: ROBAXIN  Take 1 tablet (500 mg total) by mouth every 8 (eight) hours as needed for muscle spasms.   metolazone  2.5 MG tablet Commonly known as: ZAROXOLYN  Take 1 tablet (2.5 mg total) by mouth 3 (three) times a week. Every Tues., Thurs and Sat. with 40 meq of potassium   pantoprazole  40 MG tablet Commonly known as: PROTONIX  Take 1 tablet (40 mg total) by mouth daily.   Potassium Chloride  ER 20 MEQ Tbcr Take 1 tablet (20 mEq total) by mouth daily.   rosuvastatin  10 MG tablet Commonly known as: CRESTOR  Take 1 tablet (10 mg total) by mouth daily.   torsemide  20 MG tablet Commonly known as: DEMADEX  Take 5 tablets (100 mg total) by mouth 2 (two) times daily.   VITAMIN C  PO Take 1 tablet by mouth daily.   VITAMIN D -3 PO Take 2  capsules by mouth daily.       Allergies  Allergen Reactions   Codeine Nausea And Vomiting and Other (See Comments)    Hallucinations, too- CANNOT HAVE; thinks she may have received Narcan  to reverse the effect   Dilaudid  [Hydromorphone ] Other (See Comments)    Excessive Somnolence- Required Narcan  X2   Tape Other (See Comments)    CAN TOLERATE ONLY EASY-RELEASE, PAPER TAPE, AS THE SKIN IS VERY THIN AND WILL BRUISE AND TEAR VERY EASILY   Amoxil  [Amoxicillin ] Nausea Only   Asa [Aspirin ] Other (See Comments)    Told to avoid due to reduced kidney function, has 1 remaining kidney   Ms Contin  [Morphine ] Nausea And Vomiting and Other (See Comments)    Hallucinations, also   Neurontin  [Gabapentin ] Other (See Comments)    "Out of it"   Nsaids Other (See Comments)    Told to avoid due to reduced kidney function, has 1 remaining kidney    Follow-up Information     Claire Crick, MD Follow up.   Specialty: Family Medicine Contact information: [address] [place] [address] [phone]          Sonja Paradise, MD Follow up.   Specialties: Hematology, Oncology Contact information: [address] [place] [address] [phone]                  The results of significant diagnostics from this hospitalization (including imaging, microbiology, ancillary and laboratory) are listed below for reference.    Significant Diagnostic Studies: ECHOCARDIOGRAM COMPLETE BUBBLE STUDY Result Date: 09/06/2023    ECHOCARDIOGRAM REPORT   Patient Name:   JAXSON LEHAN Date of Exam: 09/06/2023 Medical Rec #:  [id number]         Height:       66.5 in Accession #:    [id number]        Weight:       141.3 lb Date of Birth:  1933-07-17         BSA:          1.735 m Patient Age:    89 years          BP:           124/65 mmHg Patient Gender: F                 HR:           61 bpm. Exam Location:  [facility] Procedure: 2D Echo, Cardiac Doppler and Color Doppler (Both Spectral and Color            Flow Doppler were utilized during procedure). Indications:     Bacteremia  History:         Patient has prior history of Echocardiogram examinations, most                  recent 05/28/2023. CHF, Defibrillator, Pulmonary HTN and TIA,                  Arrythmias:Atrial Fibrillation and Bradycardia; Risk                  Factors:Hypertension and Dyslipidemia.  Sonographer:     Terrilee Few RCS Referring Phys:  [id number] Tekeshia Klahr B[place] DGUY Diagnosing Phys: Sheryle Donning MD IMPRESSIONS  1. Left ventricular ejection fraction, by estimation, is 60 to 65%. The left ventricle has normal function. The left ventricle has no regional wall motion abnormalities. There is mild left ventricular hypertrophy. Left ventricular  diastolic function could not be evaluated. There is the interventricular septum is flattened in systole and diastole, consistent with right ventricular pressure and volume overload.  2. Right ventricular systolic function is moderately reduced. The right ventricular size is severely enlarged. There is severely  elevated pulmonary artery systolic pressure. The estimated right ventricular systolic pressure is 96.0 mmHg.  3. Left atrial size was moderately dilated.  4. Right atrial size was severely dilated.  5. A small pericardial effusion is present. Moderate pleural effusion in both left and right lateral regions.  6. The mitral valve is abnormal. Mild mitral valve regurgitation. Moderate mitral stenosis. The mean mitral valve gradient is 5.3 mmHg. Moderate mitral annular calcification.  7. Tricuspid valve regurgitation is severe.  8. The aortic valve is calcified. There is severe calcifcation of the aortic valve. There is severe thickening of the aortic valve. Aortic valve regurgitation is mild. Moderate aortic valve stenosis. Aortic valve area, by VTI measures 0.86 cm.  9. The inferior vena cava is dilated in size with <50% respiratory variability, suggesting right atrial pressure of 15 mmHg. Comparison(s): Prior images reviewed side by side. Conclusion(s)/Recommendation(s): Severe elevation in right sided pressures, with significant RV dysfunction. Unable to do bubble study as patient does not have IV access. No clear valvular vegetations noted; would typically recommend TEE if clinical concern for endocarditis, but with RVSP of 96 mmHg, she is prohibitive risk for anesthesia. FINDINGS  Left Ventricle: Left ventricular ejection fraction, by estimation, is 60 to 65%. The left ventricle has normal function. The left ventricle has no regional wall motion abnormalities. The left ventricular internal cavity size was normal in size. There is  mild left ventricular hypertrophy. The interventricular septum is flattened in systole and diastole, consistent with right ventricular pressure and volume overload. Left ventricular diastolic function could not be evaluated due to paced rhythm. Left ventricular diastolic function could not be evaluated. Right Ventricle: The right ventricular size is severely enlarged. Right vetricular  wall thickness was not well visualized. Right ventricular systolic function is moderately reduced. There is severely elevated pulmonary artery systolic pressure. The tricuspid regurgitant velocity is 4.50 m/s, and with an assumed right atrial pressure of 15 mmHg, the estimated right ventricular systolic pressure is 96.0 mmHg. Left Atrium: Left atrial size was moderately dilated. Right Atrium: Right atrial size was severely dilated. Pericardium: A small pericardial effusion is present. Mitral Valve: The mitral valve is abnormal. Moderate mitral annular calcification. Mild mitral valve regurgitation. Moderate mitral valve stenosis. MV peak gradient, 23.1 mmHg. The mean mitral valve gradient is 5.3 mmHg. Tricuspid Valve: The tricuspid valve is grossly normal. Tricuspid valve regurgitation is severe. No evidence of tricuspid stenosis. Aortic Valve: Likely low flow-low gradient moderate aortic stenosis given stroke volume. The aortic valve is calcified. There is severe calcifcation of the aortic valve. There is severe thickening of the aortic valve. There is moderate to severe aortic valve annular calcification. Aortic valve regurgitation is mild. Aortic regurgitation PHT measures 369 msec. Moderate aortic stenosis is present. Aortic valve mean gradient measures 13.7 mmHg. Aortic valve peak gradient measures 23.8 mmHg. Aortic valve area, by VTI measures 0.86 cm. Pulmonic Valve: The pulmonic valve was grossly normal. Pulmonic valve regurgitation is mild to moderate. No evidence of pulmonic stenosis. Aorta: The aortic root and ascending aorta are structurally normal, with no evidence of dilitation. Venous: The inferior vena cava is dilated in size with less than 50% respiratory variability, suggesting right atrial pressure of 15 mmHg. IAS/Shunts: The atrial septum is  grossly normal. Additional Comments: A device lead is visualized in the right atrium. There is a moderate pleural effusion in both left and right lateral  regions.  LEFT VENTRICLE PLAX 2D LVIDd:         3.80 cm   Diastology LVIDs:         2.40 cm   LV e' medial:    9.46 cm/s LV PW:         1.30 cm   LV E/e' medial:  8.7 LV IVS:        0.80 cm   LV e' lateral:   8.49 cm/s LVOT diam:     1.90 cm   LV E/e' lateral: 9.7 LV SV:         48 LV SV Index:   28 LVOT Area:     2.84 cm  RIGHT VENTRICLE             IVC RV S prime:     13.30 cm/s  IVC diam: 2.30 cm TAPSE (M-mode): 1.2 cm LEFT ATRIUM             Index        RIGHT ATRIUM           Index LA diam:        5.20 cm 3.00 cm/m   RA Area:     32.40 cm LA Vol (A2C):   71.7 ml 41.32 ml/m  RA Volume:   126.00 ml 72.62 ml/m LA Vol (A4C):   55.4 ml 31.93 ml/m LA Biplane Vol: 66.0 ml 38.04 ml/m  AORTIC VALVE                     PULMONIC VALVE AV Area (Vmax):    0.89 cm      PV Vmax:          1.17 m/s AV Area (Vmean):   0.82 cm      PV Peak grad:     5.5 mmHg AV Area (VTI):     0.86 cm      PR End Diast Vel: 7.13 msec AV Vmax:           244.00 cm/s AV Vmean:          171.333 cm/s AV VTI:            0.557 m AV Peak Grad:      23.8 mmHg AV Mean Grad:      13.7 mmHg LVOT Vmax:         76.77 cm/s LVOT Vmean:        49.267 cm/s LVOT VTI:          0.169 m LVOT/AV VTI ratio: 0.30 AI PHT:            369 msec  AORTA Ao Root diam: 2.70 cm Ao Asc diam:  2.70 cm MITRAL VALVE               TRICUSPID VALVE MV Area (PHT): 4.19 cm    TR Peak grad:   81.0 mmHg MV Area VTI:   0.90 cm    TR Vmax:        450.00 cm/s MV Peak grad:  23.1 mmHg MV Mean grad:  5.3 mmHg    SHUNTS MV Vmax:       2.40 m/s    Systemic VTI:  0.17 m MV Vmean:      93.1 cm/s   Systemic Diam: 1.90 cm MV Decel Time: 181  msec MR Peak grad: 114.7 mmHg MR Vmax:      535.50 cm/s MV E velocity: 82.10 cm/s Sheryle Donning MD Electronically signed by Sheryle Donning MD Signature Date/Time: 09/06/2023/1:28:48 PM    Final    DG Chest Port 1 View Result Date: 09/03/2023 CLINICAL DATA:  Question of sepsis to evaluate for abnormality. EXAM: PORTABLE CHEST 1 VIEW  COMPARISON:  06/24/2023 FINDINGS: Cardiac pacemaker. Cardiac enlargement with pulmonary vascular congestion. Patchy perihilar infiltrates with interstitial changes in the lung bases, likely edema. Small bilateral pleural effusions are decreased since prior study. No focal consolidation. Mediastinal contours appear intact. Calcification of the aorta. Degenerative changes in the spine and shoulders. Sclerosis in the left humeral head likely represents benign bone island or possibly enchondroma. No change. IMPRESSION: Cardiac enlargement with pulmonary vascular congestion and interstitial edema. Small bilateral pleural effusions are decreased since prior study. Electronically Signed   By: Boyce Byes M.D.   On: 09/03/2023 16:13   CT ABDOMEN PELVIS WO CONTRAST Result Date: 09/03/2023 CLINICAL DATA:  Acute nonlocalized abdominal pain. Bilateral leg swelling and wounds. EXAM: CT ABDOMEN AND PELVIS WITHOUT CONTRAST TECHNIQUE: Multidetector CT imaging of the abdomen and pelvis was performed following the standard protocol without IV contrast. RADIATION DOSE REDUCTION: This exam was performed according to the departmental dose-optimization program which includes automated exposure control, adjustment of the mA and/or kV according to patient size and/or use of iterative reconstruction technique. COMPARISON:  11/04/2022 FINDINGS: Lower chest: Motion artifact limits evaluation. Mild interstitial changes in the lungs may represent fibrosis or edema. Scattered pulmonary nodules. Largest is on the right, measuring 5 mm diameter. Series 4, image 4. Cardiac enlargement. Small bilateral pleural effusions with basilar atelectasis. Hepatobiliary: Liver parenchymal pattern is somewhat heterogeneous. Suggestion of focal nodules including a hypodense nodule in the dome measuring 1.6 cm and a vague nodular opacity in the inferior right lobe likely measuring around 2 cm diameter. A left lobe nodule is demonstrated in the previous  study that is not well seen today without contrast. Nodular changes are better characterized on previous contrast-enhanced studies. Lesions remain indeterminate. Pancreas: Mass in the head and body of the pancreas measuring 4.2 cm diameter. This is better seen on previous contrast-enhanced study and appears grossly unchanged. No pancreatic ductal dilatation. Spleen: Normal in size without focal abnormality. Adrenals/Urinary Tract: No adrenal gland nodules. Surgical absence of the right kidney. Left kidney appears normal. No hydronephrosis, hydroureter, or stones identified. Bladder is normal. Stomach/Bowel: Stomach, small bowel, and colon are not abnormally distended. No wall thickening or inflammatory changes. Stool throughout the colon. Appendix is not identified. Vascular/Lymphatic: Calcification of the aorta. Aortoiliac stent graft. No aneurysm. Retroperitoneal lymphadenopathy with periaortic nodes measuring up to about 10 mm diameter. This is similar to prior study. Reproductive: No pelvic mass or collections identified. Other: No free air or free fluid in the abdomen. Abdominal wall musculature appears intact. Musculoskeletal: Bilateral total hip replacements. Compression fracture of L2, unchanged since prior study. Degenerative changes in the spine. IMPRESSION: 1. 4.2 cm diameter mass in the pancreatic head/body appears grossly unchanged since prior study. 2. Indeterminate hypodense liver nodules appear unchanged. 3. Small bilateral pleural effusions with basilar atelectasis and interstitial changes. Cardiac enlargement. 4. Multiple pulmonary nodules. Most significant: Right solid pulmonary nodule measuring 5 mm. In the setting of probable cancer, these lesions are indeterminate. Follow-up as per protocol. 5. Aortic atherosclerosis. 6. Nonspecific retroperitoneal lymphadenopathy with lymph nodes measuring up to 10 mm diameter. 7. Surgical absence of the right kidney. Electronically  Signed   By: Boyce Byes M.D.   On: 09/03/2023 16:12   CT L-SPINE NO CHARGE Result Date: 09/03/2023 CLINICAL DATA:  Abdominal pain, bilateral leg swelling and wounds EXAM: CT LUMBAR SPINE WITHOUT CONTRAST TECHNIQUE: Multidetector CT imaging of the lumbar spine was performed without intravenous contrast administration. Multiplanar CT image reconstructions were also generated. RADIATION DOSE REDUCTION: This exam was performed according to the departmental dose-optimization program which includes automated exposure control, adjustment of the mA and/or kV according to patient size and/or use of iterative reconstruction technique. COMPARISON:  11/04/2022 FINDINGS: Segmentation: 5 lumbar type vertebrae. Alignment: Stable kyphosis centered at a chronic L2 compression fracture. Otherwise alignment is anatomic. Vertebrae: Bones are severely osteopenic. Chronic L2 compression deformity with greater than 75% loss of height and 8 mm of retropulsion, unchanged since previous exam. No acute displaced fractures. No destructive bony lesions. Paraspinal and other soft tissues: The paraspinal soft tissues are unremarkable. Atherosclerosis of the aorta, with bilateral iliac stents noted. Trace bilateral pleural effusions. Please refer to separately reported CT abdomen and pelvis exam for findings in that region. Disc levels: There is mild-to-moderate central canal stenosis at L1-2 and L2-3 related to the prior L2 compression fracture, stable. At L3-4 there is circumferential disc bulge with bilateral facet and ligamentum flavum hypertrophy, resulting in severe central canal stenosis and symmetrical bilateral neural foraminal encroachment. At L4-5 there is partial bony fusion across the disc space again noted. Mild circumferential disc osteophyte complex and bilateral facet hypertrophy contribute to mild central canal stenosis. At L5-S1 there is circumferential disc bulge with bilateral facet and ligamentum flavum hypertrophy resulting in moderate  central canal stenosis. Reconstructed images demonstrate no additional findings. IMPRESSION: 1. No acute lumbar spine fracture. 2. Stable chronic L2 compression deformity with greater than 75% loss of height. 3. Severe osteopenia. 4. Multilevel lumbar degenerative changes as above, with central canal stenosis most pronounced at L3-4 and L5-S1. 5. Please refer to separately reported CT abdomen and pelvis exam for findings in that region. Electronically Signed   By: Bobbye Burrow M.D.   On: 09/03/2023 16:08    Microbiology: No results found for this or any previous visit (from the past 240 hours).   Labs: Basic Metabolic Panel: Recent Labs  Lab 09/12/23 1252 09/16/23 1453 09/17/23 0603  NA 135 134* 136  K 4.2 4.5 4.1  CL 91* 89* 93*  CO2 34* 37* 32  GLUCOSE 130* 110* 154*  BUN 74* 72* 73*  CREATININE 1.75* 1.65* 1.71*  CALCIUM  9.4 9.4 9.0   Liver Function Tests: Recent Labs  Lab 09/16/23 1453  AST 20  ALT 12  ALKPHOS 61  BILITOT 1.1  PROT 7.8  ALBUMIN  3.6   No results for input(s): "LIPASE", "AMYLASE" in the last 168 hours. No results for input(s): "AMMONIA" in the last 168 hours. CBC: Recent Labs  Lab 09/12/23 1252 09/16/23 1453 09/17/23 0603  WBC 4.2 4.0 20.5*  NEUTROABS 3.3  --   --   HGB 7.3 Repeated and verified X2.* 7.4* 9.2*  HCT 24.0 Repeated and verified X2.* 26.2* 31.2*  MCV 75.9* 81.4 80.4  PLT 214.0 275 311   Cardiac Enzymes: No results for input(s): "CKTOTAL", "CKMB", "CKMBINDEX", "TROPONINI" in the last 168 hours. BNP: BNP (last 3 results) Recent Labs    06/13/23 1552 06/24/23 1621 07/10/23 1641  BNP 349.6* 516.7* 322.2*    ProBNP (last 3 results) No results for input(s): "PROBNP" in the last 8760 hours.  CBG: No results for input(s): "  GLUCAP" in the last 168 hours.     Signed:  Raymonde Calico MD.  [facility] 09/17/2023, 11:14 AM

## 2023-09-17 NOTE — TOC Transition Note (Signed)
 Transition of Care [facility]) - Discharge Note   Patient Details  Name: [name] MRN: [id number] Date of Birth: February 26, 1934  Transition of Care [facility]) CM/SW Contact:  Shalamar Plourde A Syan Cullimore, RN Phone Number: 09/17/2023, 12:58 PM   Clinical Narrative:    Chart reviewed.  Noted that patient has orders for discharge today.    I have spoken with [name]'s Garrity patient's daughter.  Caryl Clas informs me that prior to admission patient lived at home with her grandson.  She reports that patient was able to get around with a walker at home.  Caryl Clas reports that patient has a walker and walker with a seat at home.  Caryl Clas reports that patient was active with [facility] prior to admission.  She would like to continue to use [facility] on discharge.  Caryl Clas reports that patient has home o2 via [employer] and she has brought in her portable tank for transport home.      I have informed Verdis Glade with [facility] that patient will be a discharge for today.  I have informed Verdis Glade that patient will need Home health RN for wound care, PT, and OT.   Verdis Glade reports that home health services will start 24-48 hours post discharge.    Caryl Clas reports that patient has an outpatient wound clinic appointment on June 24 th 2025 at 10 am.    I have informed staff nurse of the above information.      Final next level of care: Home w Home Health Services Barriers to Discharge: No Barriers Identified   Patient Goals and CMS Choice   CMS Medicare.gov Compare Post Acute Care list provided to:: Patient        Discharge Placement                  Name of family member notified: Erini Sarkissian Patient and family notified of of transfer: 09/17/23  Discharge Plan and Services Additional resources added to the After Visit Summary for                            [facility] Arranged: RN, PT, OT   Date [facility] Agency Contacted: 09/17/23   Representative spoke with at [facility] Agency: Verdis Glade  Social Drivers of Health (SDOH) Interventions SDOH  Screenings   Food Insecurity: No Food Insecurity (09/16/2023)  Housing: Low Risk  (09/16/2023)  Transportation Needs: No Transportation Needs (09/16/2023)  Utilities: Not At Risk (09/16/2023)  Depression (PHQ2-9): Medium Risk (06/24/2023)  Financial Resource Strain: Low Risk  (05/28/2022)  Physical Activity: Inactive (09/24/2021)  Social Connections: Socially Isolated (09/16/2023)  Stress: No Stress Concern Present (09/24/2021)  Tobacco Use: Medium Risk (09/16/2023)     Readmission Risk Interventions    06/17/2023   10:07 AM 01/31/2023    3:53 PM 11/09/2022   12:00 PM  Readmission Risk Prevention Plan  Transportation Screening Complete Complete Complete  PCP or Specialist Appt within 3-5 Days   Complete  HRI or Home Care Consult  Complete Complete  Social Work Consult for Recovery Care Planning/Counseling   Complete  Palliative Care Screening  Not Applicable   Medication Review [employer])  Complete Complete  HRI or Home Care Consult Complete    SW Recovery Care/Counseling Consult Complete    Palliative Care Screening Not Applicable    Skilled Nursing Facility Not Applicable

## 2023-09-17 NOTE — Progress Notes (Addendum)
 Pt has BL wounds to lower extremities with black escar tissue surrounding them, I put in a wound care consult for recommendations but  pt declined to stay for wound care eval and stated "She wanted to wait and see what the wound care center wanted he to do for the wounds since appointment is already made for June 5th". Wound care orders are in for  Discharge but pt refusing care.

## 2023-09-18 ENCOUNTER — Telehealth: Payer: Self-pay

## 2023-09-18 ENCOUNTER — Telehealth: Payer: Self-pay | Admitting: Family Medicine

## 2023-09-18 DIAGNOSIS — N184 Chronic kidney disease, stage 4 (severe): Secondary | ICD-10-CM | POA: Diagnosis not present

## 2023-09-18 DIAGNOSIS — I5032 Chronic diastolic (congestive) heart failure: Secondary | ICD-10-CM | POA: Diagnosis not present

## 2023-09-18 DIAGNOSIS — K922 Gastrointestinal hemorrhage, unspecified: Secondary | ICD-10-CM | POA: Diagnosis not present

## 2023-09-18 DIAGNOSIS — A4159 Other Gram-negative sepsis: Secondary | ICD-10-CM | POA: Diagnosis not present

## 2023-09-18 DIAGNOSIS — I130 Hypertensive heart and chronic kidney disease with heart failure and stage 1 through stage 4 chronic kidney disease, or unspecified chronic kidney disease: Secondary | ICD-10-CM | POA: Diagnosis not present

## 2023-09-18 DIAGNOSIS — L03115 Cellulitis of right lower limb: Secondary | ICD-10-CM | POA: Diagnosis not present

## 2023-09-18 DIAGNOSIS — D630 Anemia in neoplastic disease: Secondary | ICD-10-CM | POA: Diagnosis not present

## 2023-09-18 DIAGNOSIS — C44722 Squamous cell carcinoma of skin of right lower limb, including hip: Secondary | ICD-10-CM | POA: Diagnosis not present

## 2023-09-18 DIAGNOSIS — I4821 Permanent atrial fibrillation: Secondary | ICD-10-CM | POA: Diagnosis not present

## 2023-09-18 DIAGNOSIS — M1030 Gout due to renal impairment, unspecified site: Secondary | ICD-10-CM | POA: Diagnosis not present

## 2023-09-18 DIAGNOSIS — L03116 Cellulitis of left lower limb: Secondary | ICD-10-CM | POA: Diagnosis not present

## 2023-09-18 DIAGNOSIS — C254 Malignant neoplasm of endocrine pancreas: Secondary | ICD-10-CM | POA: Diagnosis not present

## 2023-09-18 NOTE — Telephone Encounter (Signed)
 Copied from CRM [id number]. Topic: General - Call Back - No Documentation >> Sep 18, 2023 11:06 AM Lajean Pike wrote: Reason for CRM: Princess from [facility] [phone])- advised that patient missed her frequency this week due to low hemoglobin and plans to resume next week per patients request. Advised patient received blood on Tuesday and is still feeling weak from that.

## 2023-09-18 NOTE — Transitions of Care (Post Inpatient/ED Visit) (Unsigned)
   09/18/2023  Name: ALAINNA KARCHER MRN: [id number] DOB: [birth year]  Today's TOC FU Call Status: Today's TOC FU Call Status:: Unsuccessful Call (1st Attempt) Unsuccessful Call (1st Attempt) Date: 09/18/23  Attempted to reach the patient regarding the most recent Inpatient/ED visit.  Follow Up Plan: Additional outreach attempts will be made to reach the patient to complete the Transitions of Care (Post Inpatient/ED visit) call.   Signature Darrall Ellison, LPN [facility] Nurse Health Advisor Direct Dial [phone]

## 2023-09-19 ENCOUNTER — Encounter

## 2023-09-19 ENCOUNTER — Inpatient Hospital Stay

## 2023-09-19 LAB — TYPE AND SCREEN
ABO/RH(D): A NEG
Antibody Screen: POSITIVE
DAT, IgG: NEGATIVE
DAT, complement: NEGATIVE
Unit division: 0
Unit division: 0
Unit division: 0

## 2023-09-19 LAB — BPAM RBC
Blood Product Expiration Date: 202505292359
Blood Product Expiration Date: 202505232359
Blood Product Expiration Date: 202506102359
ISSUE DATE / TIME: 202505140217
Unit Type and Rh: 202505292359
Unit Type and Rh: 5100
Unit Type and Rh: 600
Unit Type and Rh: 9500

## 2023-09-19 NOTE — Transitions of Care (Post Inpatient/ED Visit) (Signed)
 09/19/2023  Name: [name] MRN: [id number] DOB: Feb 20, 1934  Today's TOC FU Call Status: Today's TOC FU Call Status:: Successful TOC FU Call Completed Unsuccessful Call (1st Attempt) Date: 09/18/23 [facility] FU Call Complete Date: 09/19/23 Patient's Name and Date of Birth confirmed.  Transition Care Management Follow-up Telephone Call Date of Discharge: 09/17/23 Discharge Facility: [facility]) Type of Discharge: Emergency Department Reason for ED Visit: Other: (GI bleed) How have you been since you were released from the hospital?: Better Any questions or concerns?: No  Items Reviewed: Did you receive and understand the discharge instructions provided?: Yes Medications obtained,verified, and reconciled?: Yes (Medications Reviewed) Any new allergies since your discharge?: No Dietary orders reviewed?: Yes Do you have support at home?: Yes People in Home [RPT]: grandchild(ren)  Medications Reviewed Today: Medications Reviewed Today     Reviewed by Darrall Ellison, LPN (Licensed Practical Nurse) on 09/19/23 at 1125  Med List Status: <None>   Medication Order Taking? Sig Documenting Provider Last Dose Status Informant  acetaminophen  (TYLENOL ) 500 MG tablet [id number] No Take 1,000 mg by mouth every 8 (eight) hours. [provider] Taking Active Self, Pharmacy Records  Ascorbic Acid  (VITAMIN C  PO) [id number] No Take 1 tablet by mouth daily. [provider] Taking Active Self, Pharmacy Records           Med Note Purnell Bruch, EMER M   Thu Jul 10, 2023  3:42 PM)    Cholecalciferol  (VITAMIN D -3 PO) [id number] No Take 2 capsules by mouth daily. [provider] Taking Active Self, Pharmacy Records           Med Note Purnell Bruch, EMER M   Thu Jul 10, 2023  3:42 PM)    clotrimazole -betamethasone  (LOTRISONE ) cream [id number] No Apply topically 2 (two) times daily. Althia Atlas, MD Taking Active   CRANBERRY PO [id number] No Take 1 tablet by  mouth daily. [provider] Taking Active Self, Pharmacy Records           Med Note Purnell Bruch, EMER M   Thu Jul 10, 2023  3:42 PM)    Cyanocobalamin  (B-12 PO) [id number] No Take 1 tablet by mouth daily. [provider] Taking Active Self, Pharmacy Records           Med Note Purnell Bruch, EMER M   Thu Jul 10, 2023  3:41 PM)    ferrous gluconate  (FERGON) 324 MG tablet [id number] No Take 1 tablet (324 mg total) by mouth daily with breakfast. Rai, Hurman Maiden, MD Taking Active Self, Pharmacy Records  fluticasone  (FLONASE ) 50 MCG/ACT nasal spray [id number] No Place 2 sprays into both nostrils daily as needed for allergies or rhinitis. Claire Crick, MD Taking Active Self, Pharmacy Records           Med Note [phone], GARY M   Wed Sep 03, 2023  6:18 PM) PRN  methocarbamol  (ROBAXIN ) 500 MG tablet [id number] No Take 1 tablet (500 mg total) by mouth every 8 (eight) hours as needed for muscle spasms. Althia Atlas, MD Taking Active   metolazone  (ZAROXOLYN ) 2.5 MG tablet [id number] No Take 1 tablet (2.5 mg total) by mouth 3 (three) times a week. Every Tues., Thurs and Sat. with 40 meq of potassium Stoner, Benjamin J, MD Taking Active Self, Pharmacy Records  Multiple Vitamins-Minerals [facility] SKIN NAILS PO) [id number] No Take 1 capsule by mouth daily. [provider] Taking Active Self, Pharmacy Records  pantoprazole  (PROTONIX ) 40 MG tablet [id number] No Take 1 tablet (40 mg total) by mouth  daily. Loma Rising, MD Taking Active Self, Pharmacy Records           Med Note Alfredo Inch   Tue Apr 15, 2023 12:13 PM)    Potassium Chloride  ER 20 MEQ TBCR [id number] No Take 1 tablet (20 mEq total) by mouth daily. Lauralee Poll, MD Taking Active Self, Pharmacy Records  rosuvastatin  (CRESTOR ) 10 MG tablet [id number] No Take 1 tablet (10 mg total) by mouth daily. Brenna Cam, MD Taking Active Self, Pharmacy Records  torsemide  (DEMADEX ) 20 MG tablet [id number] No Take 5 tablets (100 mg total)  by mouth 2 (two) times daily. Darlis Eisenmenger, MD Taking Active Self, Pharmacy Records            Home Care and Equipment/Supplies: Were Home Health Services Ordered?: NA Any new equipment or medical supplies ordered?: NA  Functional Questionnaire: Do you need assistance with bathing/showering or dressing?: No Do you need assistance with meal preparation?: No Do you need assistance with eating?: No Do you have difficulty maintaining continence: No Do you need assistance with getting out of bed/getting out of a chair/moving?: No Do you have difficulty managing or taking your medications?: No  Follow up appointments reviewed: PCP Follow-up appointment confirmed?: Yes Date of PCP follow-up appointment?: 09/30/23 (patient doesn't want to reschedule sooner. wants to keep 09/30/23 appt) Follow-up Provider: [facility] Follow-up appointment confirmed?: NA Do you need transportation to your follow-up appointment?: No Do you understand care options if your condition(s) worsen?: Yes-patient verbalized understanding    SIGNATURE Darrall Ellison, LPN [facility] Nurse Health Advisor Direct Dial [phone]

## 2023-09-19 NOTE — Telephone Encounter (Signed)
 Agree. Thanks

## 2023-09-19 NOTE — Telephone Encounter (Signed)
Plz triage pt.  °

## 2023-09-19 NOTE — Telephone Encounter (Signed)
 I was unable to speak with [facility] with [facility] and I spoke with Albertina Alpers RN and she will contact Princess about FU with Dr Maryalice Smaller since pt just had blood transfusion and still feeling weak. Sending note to Dr Vallarie Gauze who is in office today  and Edwina Gram CMA, and FYI to Dr Crissie Dome as PCP.

## 2023-09-22 ENCOUNTER — Other Ambulatory Visit

## 2023-09-22 ENCOUNTER — Ambulatory Visit: Admitting: Hematology

## 2023-09-22 ENCOUNTER — Ambulatory Visit

## 2023-09-24 ENCOUNTER — Ambulatory Visit

## 2023-09-24 ENCOUNTER — Encounter

## 2023-09-24 NOTE — Progress Notes (Unsigned)
 Subjective:   [name] is a 89 y.o. who presents for a Medicare Wellness preventive visit.  As a reminder, Annual Wellness Visits don't include a physical exam, and some assessments may be limited, especially if this visit is performed virtually. We may recommend an in-person follow-up visit with your provider if needed.  Visit Complete:     Persons Participating in Visit:   AWV Questionnaire:        Objective:     There were no vitals filed for this visit. There is no height or weight on file to calculate BMI.     09/16/2023    9:54 PM 09/16/2023    2:52 PM 09/03/2023    6:14 PM 09/03/2023    3:50 PM 06/24/2023    4:09 PM 06/13/2023    3:49 PM 05/27/2023    8:20 PM  Advanced Directives  Does Patient Have a Medical Advance Directive? Yes Yes No No No No No  Type of Advance Directive Out of facility DNR (pink MOST or yellow form) Out of facility DNR (pink MOST or yellow form)       Does patient want to make changes to medical advance directive? No - Patient declined        Would patient like information on creating a medical advance directive? No - Patient declined  No - Patient declined No - Patient declined No - Patient declined No - Patient declined No - Patient declined    Current Medications (verified) Outpatient Encounter Medications as of 09/24/2023  Medication Sig  . acetaminophen  (TYLENOL ) 500 MG tablet Take 1,000 mg by mouth every 8 (eight) hours.  . Ascorbic Acid  (VITAMIN C  PO) Take 1 tablet by mouth daily.  . Cholecalciferol  (VITAMIN D -3 PO) Take 2 capsules by mouth daily.  . clotrimazole -betamethasone  (LOTRISONE ) cream Apply topically 2 (two) times daily.  Aaron Aas CRANBERRY PO Take 1 tablet by mouth daily.  . Cyanocobalamin  (B-12 PO) Take 1 tablet by mouth daily.  . ferrous gluconate  (FERGON) 324 MG tablet Take 1 tablet (324 mg total) by mouth daily with breakfast.  . fluticasone  (FLONASE ) 50 MCG/ACT nasal spray Place 2 sprays into both nostrils daily as  needed for allergies or rhinitis.  . methocarbamol  (ROBAXIN ) 500 MG tablet Take 1 tablet (500 mg total) by mouth every 8 (eight) hours as needed for muscle spasms.  . metolazone  (ZAROXOLYN ) 2.5 MG tablet Take 1 tablet (2.5 mg total) by mouth 3 (three) times a week. Every Tues., Thurs and Sat. with 40 meq of potassium  . Multiple Vitamins-Minerals (HAIR SKIN NAILS PO) Take 1 capsule by mouth daily.  . pantoprazole  (PROTONIX ) 40 MG tablet Take 1 tablet (40 mg total) by mouth daily.  . Potassium Chloride  ER 20 MEQ TBCR Take 1 tablet (20 mEq total) by mouth daily.  . rosuvastatin  (CRESTOR ) 10 MG tablet Take 1 tablet (10 mg total) by mouth daily.  . torsemide  (DEMADEX ) 20 MG tablet Take 5 tablets (100 mg total) by mouth 2 (two) times daily.   No facility-administered encounter medications on file as of 09/24/2023.    Allergies (verified) Codeine, Dilaudid  [hydromorphone ], Tape, Amoxil  [amoxicillin ], Asa [aspirin ], Ms contin  [morphine ], Neurontin  [gabapentin ], and Nsaids   History: Past Medical History:  Diagnosis Date  . (HFpEF) heart failure with preserved ejection fraction (HCC)    EF=60-60%  . Actinic keratosis 01/17/2015   R forearm  . Adjustment disorder with anxiety   . Adverse effect of other narcotics, sequela    Intolerance to all  narcotics  . Anti-Duffy antibodies present   . Aortic valve stenosis   . Arthritis    "some in my hands" (11/11/2012)  . Atrial fibrillation, permanent (HCC)    Eliquis   . Atypical mole 03/25/2018   L forearm - severe  . Automatic implantable cardioverter-defibrillator in situ   . Avascular necrosis of hip (HCC) 05/03/2011  . Carotid artery stenosis 09/2007   a. 09/2007: 60-79% bilateral (stable); b. 10/2008: 40-59% R 60-79%   . Cellulitis of left lower extremity 07/13/2020  . CKD (chronic kidney disease), stage III (HCC)   . COPD (chronic obstructive pulmonary disease) (HCC)   . Coronary artery disease    non-obstructive by 2006 cath  . COVID-19  virus infection 02/12/2022  . Displaced fracture of left femoral neck (HCC) 10/09/2018  . GI bleed 03/28/2020   AVM  . High cholesterol   . Hypertension 05/20/2011  . Hypertr obst cardiomyop   . Hypotension, unspecified    cardiac cath 2006..nonobstructive CAD 30-40s lesions.Aaron AasETT 1/09 nondiagnostic due to poor HR response..Right Renal Cancer 2003  . Iron  deficiency anemia   . Long term (current) use of anticoagulants   . On home oxygen  therapy    3L [place]  . Osteoarthritis of right hip   . PAD (peripheral artery disease) (HCC)   . Pancreatic cancer (HCC)    sandostatin   . PONV (postoperative nausea and vomiting)   . Presence of permanent cardiac pacemaker   . Pulmonary HTN (HCC)   . RECTAL BLEEDING 10/13/2009   Qualifier: Diagnosis of  By: Daphane Dynes NP, Maureen Sour    . 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 Must be transfused with PRB which are Duffy Antigen Negative and Crossmatch Compatible  . Renal cell carcinoma (HCC) 2005   s/p right nephrectomy  . Squamous cell carcinoma of skin 01/17/2015   R lat wrist  . Squamous cell carcinoma of skin 01/29/2018   R post upper leg - superficially invasive  . Squamous cell carcinoma of skin 03/25/2018   L lat foot  . Squamous cell carcinoma of skin 11/09/2020   left lat foot - EDC 01/01/21, recurrent 01/31/21 - MOHs 02/22/21  . Squamous cell carcinoma of skin 12/19/2021   Right Posterior Medial Thigh, EDC  . Squamous cell carcinoma of skin 12/19/2021   SCC IS, L lat heel, EDC 02/04/2022  . Squamous cell carcinoma of skin 01/21/2022   L forearm, EDC 02/04/2022  . Squamous cell carcinoma of skin 01/21/2022   R lower leg below knee, EDC 02/04/2022  . Squamous cell carcinoma of skin 01/21/2022   SCCIS, R post heel, EDC 02/04/2022  . Squamous cell carcinoma of skin 07/01/2022   left lower abdomen, in situ, EDC  . Squamous cell carcinoma of skin 07/01/2022   left medial chest,  EDC  . Urge incontinence   . Venous stasis of both lower extremities    Past Surgical History:  Procedure Laterality Date  . ABDOMINAL AORTOGRAM W/LOWER EXTREMITY N/A 02/12/2021   Procedure: ABDOMINAL AORTOGRAM W/LOWER EXTREMITY;  Surgeon: Adine Hoof, MD;  Location: [facility] INVASIVE CV LAB;  Service: Cardiovascular;  Laterality: N/A;  . ABDOMINAL HYSTERECTOMY  1975   for benign causes  . APPENDECTOMY    . BI-VENTRICULAR PACEMAKER UPGRADE  05/04/2010  . BIOPSY  02/28/2019   Procedure: BIOPSY;  Surgeon: Janel Medford, MD;  Location: [facility] ENDOSCOPY;  Service: Endoscopy;;  . BIOPSY  03/04/2022   Procedure:  BIOPSY;  Surgeon: Brice Campi Albino Alu., MD;  Location: [facility] ENDOSCOPY;  Service: Gastroenterology;;  . CARDIAC CATHETERIZATION  2006  . CARDIOVERSION N/A 02/20/2018   Procedure: CARDIOVERSION;  Surgeon: Devorah Fonder, MD;  Location: [facility] ORS;  Service: Cardiovascular;  Laterality: N/A;  . CARDIOVERSION N/A 03/27/2018   Procedure: CARDIOVERSION;  Surgeon: Devorah Fonder, MD;  Location: [facility] ORS;  Service: Cardiovascular;  Laterality: N/A;  . CATARACT EXTRACTION W/ INTRAOCULAR LENS  IMPLANT, BILATERAL  01/2006-02-2006  . CHOLECYSTECTOMY N/A 11/11/2012   Procedure: LAPAROSCOPIC CHOLECYSTECTOMY WITH INTRAOPERATIVE CHOLANGIOGRAM;  Surgeon: Kari Otto. Eli Grizzle, MD;  Location: [facility] OR;  Service: General;  Laterality: N/A;  . COLONOSCOPY WITH PROPOFOL  N/A 02/28/2019   Procedure: COLONOSCOPY WITH PROPOFOL ;  Surgeon: Janel Medford, MD;  Location: [facility] ENDOSCOPY;  Service: Endoscopy;  Laterality: N/A;  . ENTEROSCOPY N/A 03/30/2020   Procedure: ENTEROSCOPY;  Surgeon: Annis Kinder, DO;  Location: [facility] ENDOSCOPY;  Service: Gastroenterology;  Laterality: N/A;  . ENTEROSCOPY N/A 02/10/2021   Procedure: ENTEROSCOPY;  Surgeon: Alvis Jourdain, MD;  Location: [facility] ENDOSCOPY;  Service: Endoscopy;  Laterality: N/A;  . ENTEROSCOPY N/A 02/15/2022   Procedure: ENTEROSCOPY;  Surgeon: Daina Drum,  MD;  Location: [facility] ENDOSCOPY;  Service: Gastroenterology;  Laterality: N/A;  . ENTEROSCOPY N/A 11/06/2022   Procedure: ENTEROSCOPY;  Surgeon: Brice Campi Albino Alu., MD;  Location: [facility] ENDOSCOPY;  Service: Gastroenterology;  Laterality: N/A;  . EP IMPLANTABLE DEVICE N/A 02/21/2016   Procedure: ICD Generator Changeout;  Surgeon: Verona Goodwill, MD;  Location: [facility] INVASIVE CV LAB;  Service: Cardiovascular;  Laterality: N/A;  . ESOPHAGOGASTRODUODENOSCOPY N/A 03/04/2022   Procedure: ESOPHAGOGASTRODUODENOSCOPY (EGD);  Surgeon: Normie Becton., MD;  Location: [facility]OSCOPY;  Service: Gastroenterology;  Laterality: N/A;  . ESOPHAGOGASTRODUODENOSCOPY (EGD) WITH PROPOFOL  N/A 02/28/2019   Procedure: ESOPHAGOGASTRODUODENOSCOPY (EGD) WITH PROPOFOL ;  Surgeon: Janel Medford, MD;  Location: [facility] ENDOSCOPY;  Service: Endoscopy;  Laterality: N/A;  . EUS N/A 03/04/2022   Procedure: UPPER ENDOSCOPIC ULTRASOUND (EUS) RADIAL;  Surgeon: Brice Campi Albino Alu., MD;  Location: [facility] ENDOSCOPY;  Service: Gastroenterology;  Laterality: N/A;  . FINE NEEDLE ASPIRATION N/A 03/04/2022   Procedure: FINE NEEDLE ASPIRATION (FNA) LINEAR;  Surgeon: Normie Becton., MD;  Location: [facility] ENDOSCOPY;  Service: Gastroenterology;  Laterality: N/A;  . GIVENS CAPSULE STUDY N/A 03/15/2019   Procedure: GIVENS CAPSULE STUDY;  Surgeon: Lindle Rhea, MD;  Location: [facility] ENDOSCOPY;  Service: Gastroenterology;  Laterality: N/A;  . HOT HEMOSTASIS N/A 03/30/2020   Procedure: HOT HEMOSTASIS (ARGON PLASMA COAGULATION/BICAP);  Surgeon: Annis Kinder, DO;  Location: [facility] ENDOSCOPY;  Service: Gastroenterology;  Laterality: N/A;  . HOT HEMOSTASIS N/A 02/10/2021   Procedure: HOT HEMOSTASIS (ARGON PLASMA COAGULATION/BICAP);  Surgeon: Alvis Jourdain, MD;  Location: [facility] ENDOSCOPY;  Service: Endoscopy;  Laterality: N/A;  . HOT HEMOSTASIS N/A 02/15/2022   Procedure: HOT HEMOSTASIS (ARGON PLASMA COAGULATION/BICAP);  Surgeon: Daina Drum, MD;   Location: [facility] ENDOSCOPY;  Service: Gastroenterology;  Laterality: N/A;  . HOT HEMOSTASIS N/A 11/06/2022   Procedure: HOT HEMOSTASIS (ARGON PLASMA COAGULATION/BICAP);  Surgeon: Normie Becton., MD;  Location: [facility] ENDOSCOPY;  Service: Gastroenterology;  Laterality: N/A;  . INSERT / REPLACE / REMOVE PACEMAKER  05-01-11   02-28-05-/05-04-10-ICD-MEDTRONIC MAXIMAL DR  . IR ANGIOGRAM SELECTIVE EACH ADDITIONAL VESSEL  02/18/2022  . IR ANGIOGRAM SELECTIVE EACH ADDITIONAL VESSEL  02/18/2022  . IR ANGIOGRAM VISCERAL SELECTIVE  02/16/2022  . IR ANGIOGRAM VISCERAL SELECTIVE  02/16/2022  . IR EMBO ART  VEN HEMORR LYMPH EXTRAV  INC GUIDE  ROADMAPPING  02/16/2022  . IR THORACENTESIS ASP PLEURAL SPACE W/IMG GUIDE  02/21/2022  . IR THORACENTESIS ASP PLEURAL SPACE W/IMG GUIDE  02/22/2022  . IR US  GUIDE VASC ACCESS RIGHT  02/16/2022  . JOINT REPLACEMENT    . LAPAROSCOPIC CHOLECYSTECTOMY  11/11/2012  . LAPAROSCOPIC LYSIS OF ADHESIONS N/A 11/11/2012   Procedure: LAPAROSCOPIC LYSIS OF ADHESIONS;  Surgeon: Kari Otto. Eli Grizzle, MD;  Location: [facility] OR;  Service: General;  Laterality: N/A;  . NEPHRECTOMY Right 06/2001    S/P RENAL CELL CANCER  . PERIPHERAL VASCULAR INTERVENTION Bilateral 02/12/2021   Procedure: PERIPHERAL VASCULAR INTERVENTION;  Surgeon: Adine Hoof, MD;  Location: [facility] INVASIVE CV LAB;  Service: Cardiovascular;  Laterality: Bilateral;  Iliac artery stents  . PRESSURE SENSOR/CARDIOMEMS N/A 02/03/2019   Procedure: PRESSURE SENSOR/CARDIOMEMS;  Surgeon: Darlis Eisenmenger, MD;  Location: [facility] INVASIVE CV LAB;  Service: Cardiovascular;  Laterality: N/A;  . RIGHT HEART CATH N/A 11/09/2018   Procedure: RIGHT HEART CATH;  Surgeon: Darlis Eisenmenger, MD;  Location: [facility] INVASIVE CV LAB;  Service: Cardiovascular;  Laterality: N/A;  . RIGHT HEART CATH N/A 03/08/2019   Procedure: RIGHT HEART CATH;  Surgeon: Darlis Eisenmenger, MD;  Location: [facility] INVASIVE CV LAB;  Service: Cardiovascular;  Laterality: N/A;  . SUBMUCOSAL  TATTOO INJECTION  02/28/2019   Procedure: SUBMUCOSAL TATTOO INJECTION;  Surgeon: Janel Medford, MD;  Location: [facility] ENDOSCOPY;  Service: Endoscopy;;  . SUBMUCOSAL TATTOO INJECTION  11/06/2022   Procedure: SUBMUCOSAL TATTOO INJECTION;  Surgeon: Normie Becton., MD;  Location: [facility] ENDOSCOPY;  Service: Gastroenterology;;  . TOTAL HIP ARTHROPLASTY Right 05/03/2011   Procedure: TOTAL HIP ARTHROPLASTY ANTERIOR APPROACH;  Surgeon: Arnie Lao;  Location: [facility] ORS;  Service: Orthopedics;  Laterality: Right;  Removal of Cannulated Screws Right Hip, Right Direct Anterior Hip Replacement  . TOTAL HIP ARTHROPLASTY Left 10/09/2018   Procedure: TOTAL HIP ARTHROPLASTY ANTERIOR APPROACH;  Surgeon: Adonica Hoose, MD;  Location: [facility] OR;  Service: Orthopedics;  Laterality: Left;   Family History  Problem Relation Age of Onset  . Heart failure Mother   . Early death Father        car accident  . Breast cancer Maternal Aunt 82  . Breast cancer Cousin   . Breast cancer Other   . Colon cancer Neg Hx   . Esophageal cancer Neg Hx   . Liver disease Neg Hx   . Pancreatic cancer Neg Hx   . Rectal cancer Neg Hx    Social History   Socioeconomic History  . Marital status: Widowed    Spouse name: Not on file  . Number of children: 2  . Years of education: high school  . Highest education level: Not on file  Occupational History  . Occupation: Retired    Associate Professor: RETIRED  Tobacco Use  . Smoking status: Former    Current packs/day: 0.00    Average packs/day: 0.5 packs/day for 40.0 years (20.0 ttl pk-yrs)    Types: Cigarettes    Start date: 05/06/1961    Quit date: 05/06/2001    Years since quitting: 22.4  . Smokeless tobacco: Never  Vaping Use  . Vaping status: Never Used  Substance and Sexual Activity  . Alcohol use: No    Alcohol/week: 0.0 standard drinks of alcohol  . Drug use: No  . Sexual activity: Not Currently  Other Topics Concern  . Not on file  Social History Narrative    Would desire CPR   07/11/20   From: [place]  area   Living: with great-grandson - Herminia Lope (2003)   Work: retired - clerical      Family: 2 children - Amalia Badder and [place] - 2 grand chlidren - 5 great grand children      Enjoys: spending time with friends - watch move, eat out, spend time      Exercise: walking around the house   Diet: not good, limits red meat, limits salt, low appetite      Safety   Seat belts: Yes    Guns: Yes  and secure   Safe in relationships: Yes    Social Drivers of Health   Financial Resource Strain: Low Risk  (05/28/2022)   Overall Financial Resource Strain (CARDIA)   . Difficulty of Paying Living Expenses: Not hard at all  Food Insecurity: No Food Insecurity (09/16/2023)   Hunger Vital Sign   . Worried About Programme researcher, broadcasting/film/video in the Last Year: Never true   . Ran Out of Food in the Last Year: Never true  Transportation Needs: No Transportation Needs (09/16/2023)   PRAPARE - Transportation   . Lack of Transportation (Medical): No   . Lack of Transportation (Non-Medical): No  Physical Activity: Inactive (09/24/2021)   Exercise Vital Sign   . Days of Exercise per Week: 0 days   . Minutes of Exercise per Session: 0 min  Stress: No Stress Concern Present (09/24/2021)   [employer] of Occupational Health - Occupational Stress Questionnaire   . Feeling of Stress : Not at all  Social Connections: Socially Isolated (09/16/2023)   Social Connection and Isolation Panel [NHANES]   . Frequency of Communication with Friends and Family: More than three times a week   . Frequency of Social Gatherings with Friends and Family: More than three times a week   . Attends Religious Services: Never   . Active Member of Clubs or Organizations: No   . Attends Banker Meetings: Never   . Marital Status: Widowed    Tobacco Counseling Counseling given: Not Answered    Clinical Intake:              Lab Results  Component Value Date   HGBA1C 5.0  05/27/2023   HGBA1C 4.6 (L) 02/09/2021   HGBA1C 6.1 (H) 08/17/2014               Activities of Daily Living     09/16/2023   10:00 PM 09/16/2023    9:59 PM  In your present state of health, do you have any difficulty performing the following activities:  Hearing?  0  Vision?  0  Difficulty concentrating or making decisions?  0  Doing errands, shopping? 1     Patient Care Team: Claire Crick, MD as PCP - General (Family Medicine) Darlis Eisenmenger, MD as PCP - Advanced Heart Failure (Cardiology) Devorah Fonder, MD as PCP - Cardiology (Cardiology) Verona Goodwill, MD as PCP - Electrophysiology (Cardiology) Devorah Fonder, MD as Consulting Physician (Cardiology) Verona Goodwill, MD as Consulting Physician (Cardiology) [name], Ninette Basque, MD as Consulting Physician (Vascular Surgery) Avonne Boettcher, MD as Consulting Physician (Hematology and Oncology) Sonja [place], MD as Consulting Physician (Oncology) Jonathan Neighbor, [facility] (Inactive) as Pharmacist (Pharmacist)  Indicate any recent Medical Services you may have received from other than [facility] providers in the past year (date may be approximate).     Assessment:    This is a routine wellness examination for [name].  Hearing/Vision screen No results  found.   Goals Addressed   None   Depression Screen     06/24/2023   12:33 PM 02/24/2023    2:09 PM 12/09/2022   12:52 PM 10/31/2022   11:43 AM 09/11/2022   11:31 AM 07/24/2022   11:55 AM 09/24/2021    1:03 PM  PHQ 2/9 Scores  PHQ - 2 Score 0 0 0 0  0 0  PHQ- 9 Score 6  3 1  3    Exception Documentation     Patient refusal      Fall Risk     06/24/2023   12:33 PM 12/09/2022   12:52 PM 10/31/2022   11:43 AM 09/24/2021    1:03 PM 07/16/2021    3:07 PM  Fall Risk   Falls in the past year? 0 0 0 0 0  Number falls in past yr:    0 0  Injury with Fall?    0   Risk for fall due to :    Medication side effect   Follow up    Falls evaluation completed;Education  provided;Falls prevention discussed     MEDICARE RISK AT HOME:     TIMED UP AND GO:  Was the test performed?    Cognitive Function:     03/19/2016   10:50 AM  MMSE - Mini Mental State Exam  Orientation to time 5  Orientation to Place 5  Registration 3  Attention/ Calculation 0  Recall 3  Language- name 2 objects 0  Language- repeat 1  Language- follow 3 step command 3  Language- read & follow direction 0  Write a sentence 0  Copy design 0  Total score 20        09/24/2021    1:05 PM  6CIT Screen  What Year? 0 points  What month? 0 points  What time? 0 points  Count back from 20 0 points  Months in reverse 0 points  Repeat phrase 0 points  Total Score 0 points    Immunizations Immunization History  Administered Date(s) Administered  . Fluad Quad(high Dose 65+) 03/06/2020, 05/10/2021  . Influenza Split 03/21/2011, 02/14/2012  . Influenza Whole 04/10/2009, 04/17/2010  . Influenza,inj,Quad PF,6+ Mos 03/24/2013, 01/14/2014, 02/20/2015, 02/12/2016, 02/07/2017, 03/19/2018  . PFIZER(Purple Top)SARS-COV-2 Vaccination 06/15/2019, 07/06/2019  . Pneumococcal Conjugate-13 03/06/2015  . Pneumococcal Polysaccharide-23 04/21/2001  . Td 01/25/2004  . Tdap 08/17/2010  . Zoster, Live 04/10/2009    Screening Tests Health Maintenance  Topic Date Due  . Zoster Vaccines- Shingrix (1 of 2) 03/11/1953  . COVID-19 Vaccine (3 - Pfizer risk series) 08/03/2019  . DTaP/Tdap/Td (3 - Td or Tdap) 08/16/2020  . MAMMOGRAM  10/09/2021  . INFLUENZA VACCINE  12/05/2023  . Pneumonia Vaccine 60+ Years old  Completed  . DEXA SCAN  Completed  . HPV VACCINES  Aged Out  . Meningococcal B Vaccine  Aged Out    Health Maintenance  Health Maintenance Due  Topic Date Due  . Zoster Vaccines- Shingrix (1 of 2) 03/11/1953  . COVID-19 Vaccine (3 - Pfizer risk series) 08/03/2019  . DTaP/Tdap/Td (3 - Td or Tdap) 08/16/2020  . MAMMOGRAM  10/09/2021   Health Maintenance Items  Addressed:   Additional Screening:  Vision Screening: Recommended annual ophthalmology exams for early detection of glaucoma and other disorders of the eye.  Dental Screening: Recommended annual dental exams for proper oral hygiene  Community Resource Referral / Chronic Care Management: CRR required this visit?    CCM required this visit?  Plan:    I have personally reviewed and noted the following in the patient's chart:   Medical and social history Use of alcohol, tobacco or illicit drugs  Current medications and supplements including opioid prescriptions.  Functional ability and status Nutritional status Physical activity Advanced directives List of other physicians Hospitalizations, surgeries, and ER visits in previous 12 months Vitals Screenings to include cognitive, depression, and falls Referrals and appointments  In addition, I have reviewed and discussed with patient certain preventive protocols, quality metrics, and best practice recommendations. A written personalized care plan for preventive services as well as general preventive health recommendations were provided to patient.   Nerissa Bannister, LPN   [id number]/[id number]   After Visit Summary:   Notes: ERRONEOUS ENCOUNTER This encounter was created in error - please disregard.

## 2023-09-25 ENCOUNTER — Ambulatory Visit: Admitting: Physician Assistant

## 2023-09-26 DIAGNOSIS — J449 Chronic obstructive pulmonary disease, unspecified: Secondary | ICD-10-CM | POA: Diagnosis not present

## 2023-09-26 DIAGNOSIS — I5033 Acute on chronic diastolic (congestive) heart failure: Secondary | ICD-10-CM | POA: Diagnosis not present

## 2023-09-30 ENCOUNTER — Ambulatory Visit: Admitting: Cardiology

## 2023-09-30 ENCOUNTER — Inpatient Hospital Stay: Admitting: Family Medicine

## 2023-09-30 NOTE — Assessment & Plan Note (Signed)
 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   -Outpatient EUS 03/04/2022 by Dr. 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 Freida Busman did not recommend surgery in multifocal NET and also due to her age and co-morbidities. -She began first line sandostatin injections q28 days on 03/27/22.  -Dotatate PET scan, done 2 weeks after first injection 04/10/22, shows no significant tracer avidity in the pancrease or distant sites. Findings are likely due to close timing relative to the sandostatin injection blocking receptors. When we repeat this for restaging next year, will arrange 4 weeks after sandostatin -She has persistent anemia, likely secondary to GI bleeding from the tumor and AVM, she has had multiple hospital admission for anemia and bleeding.

## 2023-10-01 ENCOUNTER — Inpatient Hospital Stay: Admitting: Hematology

## 2023-10-01 ENCOUNTER — Encounter: Payer: Self-pay | Admitting: Hematology

## 2023-10-01 ENCOUNTER — Encounter: Admitting: Family

## 2023-10-01 ENCOUNTER — Inpatient Hospital Stay

## 2023-10-01 ENCOUNTER — Ambulatory Visit

## 2023-10-01 ENCOUNTER — Inpatient Hospital Stay: Attending: Nurse Practitioner

## 2023-10-01 ENCOUNTER — Other Ambulatory Visit

## 2023-10-01 ENCOUNTER — Ambulatory Visit: Admitting: Hematology

## 2023-10-01 ENCOUNTER — Telehealth: Payer: Self-pay | Admitting: Cardiology

## 2023-10-01 VITALS — BP 148/50 | HR 56 | Temp 97.5°F | Resp 19 | Ht 66.0 in | Wt 129.3 lb

## 2023-10-01 DIAGNOSIS — D649 Anemia, unspecified: Secondary | ICD-10-CM | POA: Insufficient documentation

## 2023-10-01 DIAGNOSIS — D500 Iron deficiency anemia secondary to blood loss (chronic): Secondary | ICD-10-CM

## 2023-10-01 DIAGNOSIS — K5520 Angiodysplasia of colon without hemorrhage: Secondary | ICD-10-CM

## 2023-10-01 DIAGNOSIS — D3A8 Other benign neuroendocrine tumors: Secondary | ICD-10-CM | POA: Insufficient documentation

## 2023-10-01 LAB — CBC WITH DIFFERENTIAL (CANCER CENTER ONLY)
Abs Immature Granulocytes: 0.02 10*3/uL (ref 0.00–0.07)
Basophils Absolute: 0 10*3/uL (ref 0.0–0.1)
Basophils Relative: 0 %
Eosinophils Absolute: 0 10*3/uL (ref 0.0–0.5)
Eosinophils Relative: 1 %
HCT: 30 % — ABNORMAL LOW (ref 36.0–46.0)
Hemoglobin: 8.8 g/dL — ABNORMAL LOW (ref 12.0–15.0)
Immature Granulocytes: 0 %
Lymphocytes Relative: 9 %
Lymphs Abs: 0.5 10*3/uL — ABNORMAL LOW (ref 0.7–4.0)
MCH: 23.3 pg — ABNORMAL LOW (ref 26.0–34.0)
MCHC: 29.3 g/dL — ABNORMAL LOW (ref 30.0–36.0)
MCV: 79.6 fL — ABNORMAL LOW (ref 80.0–100.0)
Monocytes Absolute: 0.5 10*3/uL (ref 0.1–1.0)
Monocytes Relative: 9 %
Neutro Abs: 4.3 10*3/uL (ref 1.7–7.7)
Neutrophils Relative %: 81 %
Platelet Count: 203 10*3/uL (ref 150–400)
RBC: 3.77 MIL/uL — ABNORMAL LOW (ref 3.87–5.11)
RDW: 21.1 % — ABNORMAL HIGH (ref 11.5–15.5)
WBC Count: 5.3 10*3/uL (ref 4.0–10.5)
nRBC: 0 % (ref 0.0–0.2)

## 2023-10-01 LAB — CMP (CANCER CENTER ONLY)
ALT: 6 U/L (ref 0–44)
AST: 15 U/L (ref 15–41)
Albumin: 4.4 g/dL (ref 3.5–5.0)
Alkaline Phosphatase: 52 U/L (ref 38–126)
Anion gap: 10 (ref 5–15)
BUN: 54 mg/dL — ABNORMAL HIGH (ref 8–23)
CO2: 33 mmol/L — ABNORMAL HIGH (ref 22–32)
Calcium: 9.6 mg/dL (ref 8.9–10.3)
Chloride: 95 mmol/L — ABNORMAL LOW (ref 98–111)
Creatinine: 2.39 mg/dL — ABNORMAL HIGH (ref 0.44–1.00)
GFR, Estimated: 19 mL/min — ABNORMAL LOW (ref >60)
Glucose, Bld: 94 mg/dL (ref 70–99)
Potassium: 3.5 mmol/L (ref 3.5–5.1)
Sodium: 138 mmol/L (ref 135–145)
Total Bilirubin: 0.6 mg/dL (ref 0.0–1.2)
Total Protein: 8.1 g/dL (ref 6.5–8.1)

## 2023-10-01 LAB — TOTAL PROTEIN, URINE DIPSTICK: Protein, ur: NEGATIVE mg/dL

## 2023-10-01 LAB — SAMPLE TO BLOOD BANK

## 2023-10-01 MED ORDER — EPOETIN ALFA-EPBX 40000 UNIT/ML IJ SOLN
40000.0000 [IU] | Freq: Once | INTRAMUSCULAR | Status: AC
  Administered 2023-10-01: 40000 [IU] via SUBCUTANEOUS
  Filled 2023-10-01: qty 1

## 2023-10-01 MED ORDER — OCTREOTIDE ACETATE 20 MG IM KIT
20.0000 mg | PACK | Freq: Once | INTRAMUSCULAR | Status: AC
  Administered 2023-10-01: 20 mg via INTRAMUSCULAR
  Filled 2023-10-01: qty 1

## 2023-10-01 NOTE — Patient Instructions (Signed)
 Octreotide Injection Solution What is this medication? OCTREOTIDE (ok TREE oh tide) treats high levels of growth hormone (acromegaly). It works by reducing the amount of growth hormone your body makes. This reduces symptoms and the risk of health problems caused by too much growth hormone, such as diabetes and heart disease. It may also be used to treat diarrhea caused by neuroendocrine tumors. It works by slowing down the release of serotonin from the tumor cells. This reduces the number of bowel movements you have. This medicine may be used for other purposes; ask your health care provider or pharmacist if you have questions. COMMON BRAND NAME(S): Berline Lopes, Sandostatin What should I tell my care team before I take this medication? They need to know if you have any of these conditions: Diabetes Gallbladder disease Heart disease Kidney disease Liver disease Pancreatic disease Thyroid disease An unusual or allergic reaction to octreotide, other medications, foods, dyes, or preservatives Pregnant or trying to get pregnant Breastfeeding How should I use this medication? This medication is injected under the skin or into a vein. It is usually given by your care team in a hospital or clinic setting. If you get this medication at home, you will be taught how to prepare and give it. Use exactly as directed. Take it as directed on the prescription label at the same time every day. Keep taking it unless your care team tells you to stop. Allow the injection solution to come to room temperature before use. Do not warm it artificially. It is important that you put your used needles and syringes in a special sharps container. Do not put them in a trash can. If you do not have a sharps container, call your pharmacist or care team to get one. Talk to your care team about the use of this medication in children. Special care may be needed. Overdosage: If you think you have taken too much of this medicine  contact a poison control center or emergency room at once. NOTE: This medicine is only for you. Do not share this medicine with others. What if I miss a dose? If you miss a dose, take it as soon as you can. If it is almost time for your next dose, take only that dose. Do not take double or extra doses. What may interact with this medication? Bromocriptine Certain medications for blood pressure, heart disease, irregular heartbeat Cyclosporine Diuretics Medications for diabetes, including insulin Quinidine This list may not describe all possible interactions. Give your health care provider a list of all the medicines, herbs, non-prescription drugs, or dietary supplements you use. Also tell them if you smoke, drink alcohol, or use illegal drugs. Some items may interact with your medicine. What should I watch for while using this medication? Visit your care team for regular checks on your progress. Tell your care team if your symptoms do not start to get better or if they get worse. To help reduce irritation at the injection site, use a different site for each injection and make sure the solution is at room temperature before use. This medication may cause decreases in blood sugar. Signs of low blood sugar include chills, cool, pale skin or cold sweats, drowsiness, extreme hunger, fast heartbeat, headache, nausea, nervousness or anxiety, shakiness, trembling, unsteadiness, tiredness, or weakness. Contact your care team right away if you experience any of these symptoms. This medication may increase blood sugar. The risk may be higher in patients who already have diabetes. Ask your care team what you can  do to lower your risk of diabetes while taking this medication. You should make sure you get enough vitamin B12 while you are taking this medication. Discuss the foods you eat and the vitamins you take with your care team. What side effects may I notice from receiving this medication? Side effects that  you should report to your care team as soon as possible: Allergic reactions--skin rash, itching, hives, swelling of the face, lips, tongue, or throat Gallbladder problems--severe stomach pain, nausea, vomiting, fever Heart rhythm changes--fast or irregular heartbeat, dizziness, feeling faint or lightheaded, chest pain, trouble breathing High blood sugar (hyperglycemia)--increased thirst or amount of urine, unusual weakness or fatigue, blurry vision Low blood sugar (hypoglycemia)--tremors or shaking, anxiety, sweating, cold or clammy skin, confusion, dizziness, rapid heartbeat Low thyroid levels (hypothyroidism)--unusual weakness or fatigue, increased sensitivity to cold, constipation, hair loss, dry skin, weight gain, feelings of depression Low vitamin B12 level--pain, tingling, or numbness in the hands or feet, muscle weakness, dizziness, confusion, trouble concentrating Oily or light-colored stools, diarrhea, bloating, weight loss Pancreatitis--severe stomach pain that spreads to your back or gets worse after eating or when touched, fever, nausea, vomiting Slow heartbeat--dizziness, feeling faint or lightheaded, confusion, trouble breathing, unusual weakness or fatigue Side effects that usually do not require medical attention (report these to your care team if they continue or are bothersome): Diarrhea Dizziness Headache Nausea Pain, redness, or irritation at injection site Stomach pain This list may not describe all possible side effects. Call your doctor for medical advice about side effects. You may report side effects to FDA at 1-800-FDA-1088. Where should I keep my medication? Keep out of the reach of children and pets. Store in the refrigerator. Protect from light. Allow to come to room temperature naturally. Do not use artificial heat. If protected from light, the injection may be stored between 20 and 30 degrees C (70 and 86 degrees F) for 14 days. After the initial use, throw away  any unused portion of a multiple dose vial after 14 days. Get rid of any unused portions of the ampules after use. To get rid of medications that are no longer needed or have expired: Take the medication to a medication take-back program. Ask your pharmacy or law enforcement to find a location. If you cannot return the medication, ask your pharmacist or care team how to get rid of the medication safely. NOTE: This sheet is a summary. It may not cover all possible information. If you have questions about this medicine, talk to your doctor, pharmacist, or health care provider.  2024 [employer] Standard (2023-04-04 00:00:00)Epoetin Alfa Injection What is this medication? EPOETIN ALFA (e POE e tin AL fa) treats low levels of red blood cells (anemia) caused by kidney disease, chemotherapy, or HIV medications. It can also be used in people who are at risk for blood loss during surgery. It works by Systems analyst make more red blood cells, which reduces the need for blood transfusions. This medicine may be used for other purposes; ask your health care provider or pharmacist if you have questions. COMMON BRAND NAME(S): Epogen, Procrit, Retacrit What should I tell my care team before I take this medication? They need to know if you have any of these conditions: Blood clots Cancer Heart disease High blood pressure On dialysis Seizures Stroke An unusual or allergic reaction to epoetin alfa, albumin, benzyl alcohol, other medications, foods, dyes, or preservatives Pregnant or trying to get pregnant Breast-feeding How should I use this medication? This medication is  injected into a vein or under the skin. It is usually given by your care team in a hospital or clinic setting. It may also be given at home. If you get this medication at home, you will be taught how to prepare and give it. Use exactly as directed. Take it as directed on the prescription label at the same time every day. Keep taking it  unless your care team tells you to stop. It is important that you put your used needles and syringes in a special sharps container. Do not put them in a trash can. If you do not have a sharps container, call your pharmacist or care team to get one. A special MedGuide will be given to you by the pharmacist with each prescription and refill. Be sure to read this information carefully each time. Talk to your care team about the use of this medication in children. While this medication may be used in children as young as 1 month of age for selected conditions, precautions do apply. Overdosage: If you think you have taken too much of this medicine contact a poison control center or emergency room at once. NOTE: This medicine is only for you. Do not share this medicine with others. What if I miss a dose? If you miss a dose, take it as soon as you can. If it is almost time for your next dose, take only that dose. Do not take double or extra doses. What may interact with this medication? Darbepoetin alfa Methoxy polyethylene glycol-epoetin beta This list may not describe all possible interactions. Give your health care provider a list of all the medicines, herbs, non-prescription drugs, or dietary supplements you use. Also tell them if you smoke, drink alcohol, or use illegal drugs. Some items may interact with your medicine. What should I watch for while using this medication? Visit your care team for regular checks on your progress. Check your blood pressure as directed. Know what your blood pressure should be and when to contact your care team. Your condition will be monitored carefully while you are receiving this medication. You may need blood work while taking this medication. What side effects may I notice from receiving this medication? Side effects that you should report to your care team as soon as possible: Allergic reactions--skin rash, itching, hives, swelling of the face, lips, tongue, or  throat Blood clot--pain, swelling, or warmth in the leg, shortness of breath, chest pain Heart attack--pain or tightness in the chest, shoulders, arms, or jaw, nausea, shortness of breath, cold or clammy skin, feeling faint or lightheaded Increase in blood pressure Rash, fever, and swollen lymph nodes Redness, blistering, peeling, or loosening of the skin, including inside the mouth Seizures Stroke--sudden numbness or weakness of the face, arm, or leg, trouble speaking, confusion, trouble walking, loss of balance or coordination, dizziness, severe headache, change in vision Side effects that usually do not require medical attention (report to your care team if they continue or are bothersome): Bone, joint, or muscle pain Cough Headache Nausea Pain, redness, or irritation at injection site This list may not describe all possible side effects. Call your doctor for medical advice about side effects. You may report side effects to FDA at 1-800-FDA-1088. Where should I keep my medication? Keep out of the reach of children and pets. Store in a refrigerator. Do not freeze. Do not shake. Protect from light. Keep this medication in the original container until you are ready to take it. See product for storage information.  Get rid of any unused medication after the expiration date. To get rid of medications that are no longer needed or have expired: Take the medication to a medication take-back program. Check with your pharmacy or law enforcement to find a location. If you cannot return the medication, ask your pharmacist or care team how to get rid of the medication safely. NOTE: This sheet is a summary. It may not cover all possible information. If you have questions about this medicine, talk to your doctor, pharmacist, or health care provider.  2024 [employer]/Gold Standard (2021-08-24 00:00:00)

## 2023-10-01 NOTE — Progress Notes (Signed)
 [facility]   [phone] [phone]   Clinic Follow up Note   Patient Care Team: Claire Crick, MD as PCP - General (Family Medicine) Darlis Eisenmenger, MD as PCP - Advanced Heart Failure (Cardiology) Devorah Fonder, MD as PCP - Cardiology (Cardiology) Verona Goodwill, MD as PCP - Electrophysiology (Cardiology) Devorah Fonder, MD as Consulting Physician (Cardiology) Verona Goodwill, MD as Consulting Physician (Cardiology) [name], Ninette Basque, MD as Consulting Physician (Vascular Surgery) Avonne Boettcher, MD as Consulting Physician (Hematology and Oncology) Sonja Courtenay, MD as Consulting Physician (Oncology) Jonathan Neighbor, [facility] (Inactive) as Pharmacist (Pharmacist)  Date of Service:  10/01/2023  CHIEF COMPLAINT: f/u of anemia and neuroendocrine tumor  CURRENT THERAPY:  Sandostatin  injection 20 mg every 4 weeks Retacrit  injection every 2 weeks if hemoglobin less than 11.0 Venofer  200 mg as needed if ferritin less than 100 Blood transfusion if Hg<8.0 Bevacizumab  every 2-4 weeks  Oncology History   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   -Outpatient EUS 03/04/2022 by Dr. Brice Campi showed a T3N0 mass in the pancreatic head/uncinate process with 2 satellite lesions in the tail; biopsy confirmed neuroendocrine tumor of the pancreatic head mass -We reviewed her case in GI conference, Dr. Leighton Punches did not recommend surgery in multifocal NET and also due to her age and co-morbidities. -She began first line sandostatin  injections q28 days on 03/27/22.  -Dotatate PET scan, done 2 weeks after first injection 04/10/22, shows no significant tracer  avidity in the pancrease or distant sites. Findings are likely due to close timing relative to the sandostatin  injection blocking receptors. 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     Assessment & Plan Neuroendocrine tumor Neuroendocrine tumor in the pancreas is stable. Recent CT abdomen and pelvis on April 30 showed no change in the pancreatic tumor and stable liver lesions. Small bilateral pleural effusion noted. Recent increase in swelling and return of odor from leg wound noted. Bevacizumab  infusion helps manage gastrointestinal bleeding by inhibiting abnormal blood vessels but may slow wound healing. - Administer centrostatin injection today. - Schedule bevacizumab  infusion for next week. - Continue centrostatin injections every two weeks for two months, then transition to monthly maintenance therapy if tolerated.  Anemia due to GI bleeding and anemia of chronic disease  Chronic anemia secondary to gastrointestinal bleeding. Hemoglobin level is 8.8 today, stable post recent blood transfusions. No active bleeding reported. Bevacizumab  infusions are planned to manage gastrointestinal bleeding and anemia. - Schedule blood transfusions every two weeks as needed based on lab results. - Monitor hemoglobin levels regularly. - Continue bevacizumab  infusions to manage gastrointestinal bleeding. - Continue Retacrit  injection every 2 weeks for anemia  Wound on leg with infection Chronic leg wound with intermittent swelling and odor, suggesting possible infection. Recent increase in swelling and return of odor noted. No current antibiotics prescribed. Dermatologist and wound care appointments scheduled. Bevacizumab  may slow wound healing, complicating management. - Attend wound care appointment on June 3. -  Contact wound care clinic to discuss symptoms  and determine if earlier intervention or antibiotics are needed.   Goals of Care She desires to continue treatment and fight her conditions as long as possible. She is not ready for hospice care despite being informed of her serious medical conditions and prognosis. She values quality of life and prefers to be informed by her primary oncologist regarding any transition to hospice care. - Continue current treatment plan and reassess as needed based on her condition and preferences.  Plan - Labs reviewed, no need of blood transfusion this week - Will proceed Sandostatin  and Retacrit  injection today - Restart bevacizumab  next week and continue every 2 weeks for now - Labs every 2 weeks with blood transfusion appointment a few days after lab - Follow-up in 5 weeks   Discussed the use of AI scribe software for clinical note transcription with the patient, who gave verbal consent to proceed.  History of Present Illness [name] is an 89 year old female with a neuroendocrine tumor and GI bleeding-related anemia who presents for follow-up. She is accompanied by her daughter.  She was recently hospitalized twice due to anemia from GI bleeding, requiring blood transfusions. Her hemoglobin is currently 8.8 g/dL. She has missed her regular cancer treatment infusions and injections for over a month. Her last bevacizumab  infusion was on April 7, and her last cancer shot was on March 24. She typically receives infusions every two weeks and is currently off schedule.  She has no recent bleeding episodes. Her leg is swelling with a painful, odorous wound. The wound had previously improved but the odor has returned. She is not on antibiotics and has a history of sepsis related to her leg condition. She is scheduled to visit a wound care center soon. Despite following her medication regimen, the leg swelling has increased.  Her last CT scan of the abdomen  and pelvis on April 30 showed stable pancreatic and liver lesions.     All other systems were reviewed with the patient and are negative.  MEDICAL HISTORY:  Past Medical History:  Diagnosis Date   (HFpEF) heart failure with preserved ejection fraction (HCC)    EF=60-60%   Actinic keratosis 01/17/2015   R forearm   Adjustment disorder with anxiety    Adverse effect of other narcotics, sequela    Intolerance to all narcotics   Anti-Duffy antibodies present    Aortic valve stenosis    Arthritis    "some in my hands" (11/11/2012)   Atrial fibrillation, permanent (HCC)    Eliquis    Atypical mole 03/25/2018   L forearm - severe   Automatic implantable cardioverter-defibrillator in situ    Avascular necrosis of hip (HCC) 05/03/2011   Carotid artery stenosis 09/2007   a. 09/2007: 60-79% bilateral (stable); b. 10/2008: 40-59% R 60-79%    Cellulitis of left lower extremity 07/13/2020   CKD (chronic kidney disease), stage III (HCC)    COPD (chronic obstructive pulmonary disease) (HCC)    Coronary artery disease    non-obstructive by 2006 cath   COVID-19 virus infection 02/12/2022   Displaced fracture of left femoral neck (HCC) 10/09/2018   GI bleed 03/28/2020   AVM   High cholesterol    Hypertension 05/20/2011   Hypertr obst cardiomyop    Hypotension, unspecified    cardiac cath 2006..nonobstructive CAD 30-40s lesions.Aaron AasETT 1/09 nondiagnostic due to poor HR response..Right Renal Cancer 2003   Iron  deficiency anemia    Long term (current) use of anticoagulants    On home oxygen   therapy    3L [place]   Osteoarthritis of right hip    PAD (peripheral artery disease) (HCC)    Pancreatic cancer (HCC)    sandostatin    PONV (postoperative nausea and vomiting)    Presence of permanent cardiac pacemaker    Pulmonary HTN (HCC)    RECTAL BLEEDING 10/13/2009   Qualifier: Diagnosis of  By: Daphane Dynes NP, Paula     Red blood cell antibody positive with compatible PRBC difficult to obtain    Anti  FYA (Duffy a) antibody. Must be transfused with PRB which are Duffy Antigen Negative and Crossmatch Compatible   Renal cell carcinoma (HCC) 2005   s/p right nephrectomy   Squamous cell carcinoma of skin 01/17/2015   R lat wrist   Squamous cell carcinoma of skin 01/29/2018   R post upper leg - superficially invasive   Squamous cell carcinoma of skin 03/25/2018   L lat foot   Squamous cell carcinoma of skin 11/09/2020   left lat foot - EDC 01/01/21, recurrent 01/31/21 - MOHs 02/22/21   Squamous cell carcinoma of skin 12/19/2021   Right Posterior Medial Thigh, EDC   Squamous cell carcinoma of skin 12/19/2021   SCC IS, L lat heel, EDC 02/04/2022   Squamous cell carcinoma of skin 01/21/2022   L forearm, EDC 02/04/2022   Squamous cell carcinoma of skin 01/21/2022   R lower leg below knee, EDC 02/04/2022   Squamous cell carcinoma of skin 01/21/2022   SCCIS, R post heel, EDC 02/04/2022   Squamous cell carcinoma of skin 07/01/2022   left lower abdomen, in situ, EDC   Squamous cell carcinoma of skin 07/01/2022   left medial chest, EDC   Urge incontinence    Venous stasis of both lower extremities     SURGICAL HISTORY: Past Surgical History:  Procedure Laterality Date   ABDOMINAL AORTOGRAM W/LOWER EXTREMITY N/A 02/12/2021   Procedure: ABDOMINAL AORTOGRAM W/LOWER EXTREMITY;  Surgeon: Adine Hoof, MD;  Location: [facility] INVASIVE CV LAB;  Service: Cardiovascular;  Laterality: N/A;   ABDOMINAL HYSTERECTOMY  1975   for benign causes   APPENDECTOMY     BI-VENTRICULAR PACEMAKER UPGRADE  05/04/2010   BIOPSY  02/28/2019   Procedure: BIOPSY;  Surgeon: Janel Medford, MD;  Location: [facility] ENDOSCOPY;  Service: Endoscopy;;   BIOPSY  03/04/2022   Procedure: BIOPSY;  Surgeon: Normie Becton., MD;  Location: [facility] ENDOSCOPY;  Service: Gastroenterology;;   CARDIAC CATHETERIZATION  2006   CARDIOVERSION N/A 02/20/2018   Procedure: CARDIOVERSION;  Surgeon: Devorah Fonder, MD;  Location:  [facility] ORS;  Service: Cardiovascular;  Laterality: N/A;   CARDIOVERSION N/A 03/27/2018   Procedure: CARDIOVERSION;  Surgeon: Devorah Fonder, MD;  Location: [facility] ORS;  Service: Cardiovascular;  Laterality: N/A;   CATARACT EXTRACTION W/ INTRAOCULAR LENS  IMPLANT, BILATERAL  01/2006-02-2006   CHOLECYSTECTOMY N/A 11/11/2012   Procedure: LAPAROSCOPIC CHOLECYSTECTOMY WITH INTRAOPERATIVE CHOLANGIOGRAM;  Surgeon: Kari Otto. Eli Grizzle, MD;  Location: [facility] OR;  Service: General;  Laterality: N/A;   COLONOSCOPY WITH PROPOFOL  N/A 02/28/2019   Procedure: COLONOSCOPY WITH PROPOFOL ;  Surgeon: Janel Medford, MD;  Location: [facility] ENDOSCOPY;  Service: Endoscopy;  Laterality: N/A;   ENTEROSCOPY N/A 03/30/2020   Procedure: ENTEROSCOPY;  Surgeon: Annis Kinder, DO;  Location: [facility] ENDOSCOPY;  Service: Gastroenterology;  Laterality: N/A;   ENTEROSCOPY N/A 02/10/2021   Procedure: ENTEROSCOPY;  Surgeon: Alvis Jourdain, MD;  Location: [facility] ENDOSCOPY;  Service: Endoscopy;  Laterality: N/A;   ENTEROSCOPY N/A 02/15/2022   Procedure: ENTEROSCOPY;  Surgeon: Daina Drum, MD;  Location: [facility] ENDOSCOPY;  Service: Gastroenterology;  Laterality: N/A;   ENTEROSCOPY N/A 11/06/2022   Procedure: ENTEROSCOPY;  Surgeon: Brice Campi Albino Alu., MD;  Location: [facility] ENDOSCOPY;  Service: Gastroenterology;  Laterality: N/A;   EP IMPLANTABLE DEVICE N/A 02/21/2016   Procedure: ICD Generator Changeout;  Surgeon: Verona Goodwill, MD;  Location: [facility] INVASIVE CV LAB;  Service: Cardiovascular;  Laterality: N/A;   ESOPHAGOGASTRODUODENOSCOPY N/A 03/04/2022   Procedure: ESOPHAGOGASTRODUODENOSCOPY (EGD);  Surgeon: Normie Becton., MD;  Location: [facility] ENDOSCOPY;  Service: Gastroenterology;  Laterality: N/A;   ESOPHAGOGASTRODUODENOSCOPY (EGD) WITH PROPOFOL  N/A 02/28/2019   Procedure: ESOPHAGOGASTRODUODENOSCOPY (EGD) WITH PROPOFOL ;  Surgeon: Janel Medford, MD;  Location: [facility] ENDOSCOPY;  Service: Endoscopy;  Laterality: N/A;   EUS N/A 03/04/2022   Procedure:  UPPER ENDOSCOPIC ULTRASOUND (EUS) RADIAL;  Surgeon: Normie Becton., MD;  Location: [facility] ENDOSCOPY;  Service: Gastroenterology;  Laterality: N/A;   FINE NEEDLE ASPIRATION N/A 03/04/2022   Procedure: FINE NEEDLE ASPIRATION (FNA) LINEAR;  Surgeon: Normie Becton., MD;  Location: [facility] ENDOSCOPY;  Service: Gastroenterology;  Laterality: N/A;   GIVENS CAPSULE STUDY N/A 03/15/2019   Procedure: GIVENS CAPSULE STUDY;  Surgeon: Lindle Rhea, MD;  Location: [facility] ENDOSCOPY;  Service: Gastroenterology;  Laterality: N/A;   HOT HEMOSTASIS N/A 03/30/2020   Procedure: HOT HEMOSTASIS (ARGON PLASMA COAGULATION/BICAP);  Surgeon: Annis Kinder, DO;  Location: [facility] ENDOSCOPY;  Service: Gastroenterology;  Laterality: N/A;   HOT HEMOSTASIS N/A 02/10/2021   Procedure: HOT HEMOSTASIS (ARGON PLASMA COAGULATION/BICAP);  Surgeon: Alvis Jourdain, MD;  Location: [facility] ENDOSCOPY;  Service: Endoscopy;  Laterality: N/A;   HOT HEMOSTASIS N/A 02/15/2022   Procedure: HOT HEMOSTASIS (ARGON PLASMA COAGULATION/BICAP);  Surgeon: Daina Drum, MD;  Location: [facility] ENDOSCOPY;  Service: Gastroenterology;  Laterality: N/A;   HOT HEMOSTASIS N/A 11/06/2022   Procedure: HOT HEMOSTASIS (ARGON PLASMA COAGULATION/BICAP);  Surgeon: Normie Becton., MD;  Location: [facility] ENDOSCOPY;  Service: Gastroenterology;  Laterality: N/A;   INSERT / REPLACE / REMOVE PACEMAKER  05-01-11   02-28-05-/05-04-10-ICD-MEDTRONIC MAXIMAL DR   IR ANGIOGRAM SELECTIVE EACH ADDITIONAL VESSEL  02/18/2022   IR ANGIOGRAM SELECTIVE EACH ADDITIONAL VESSEL  02/18/2022   IR ANGIOGRAM VISCERAL SELECTIVE  02/16/2022   IR ANGIOGRAM VISCERAL SELECTIVE  02/16/2022   IR EMBO ART  VEN HEMORR LYMPH EXTRAV  INC GUIDE ROADMAPPING  02/16/2022   IR THORACENTESIS ASP PLEURAL SPACE W/IMG GUIDE  02/21/2022   IR THORACENTESIS ASP PLEURAL SPACE W/IMG GUIDE  02/22/2022   IR US  GUIDE VASC ACCESS RIGHT  02/16/2022   JOINT REPLACEMENT     LAPAROSCOPIC CHOLECYSTECTOMY  11/11/2012    LAPAROSCOPIC LYSIS OF ADHESIONS N/A 11/11/2012   Procedure: LAPAROSCOPIC LYSIS OF ADHESIONS;  Surgeon: Kari Otto. Eli Grizzle, MD;  Location: [facility] OR;  Service: General;  Laterality: N/A;   NEPHRECTOMY Right 06/2001    S/P RENAL CELL CANCER   PERIPHERAL VASCULAR INTERVENTION Bilateral 02/12/2021   Procedure: PERIPHERAL VASCULAR INTERVENTION;  Surgeon: Adine Hoof, MD;  Location: [facility] INVASIVE CV LAB;  Service: Cardiovascular;  Laterality: Bilateral;  Iliac artery stents   PRESSURE SENSOR/CARDIOMEMS N/A 02/03/2019   Procedure: PRESSURE SENSOR/CARDIOMEMS;  Surgeon: Darlis Eisenmenger, MD;  Location: [facility] INVASIVE CV LAB;  Service: Cardiovascular;  Laterality: N/A;   RIGHT HEART CATH N/A 11/09/2018   Procedure: RIGHT HEART CATH;  Surgeon: Darlis Eisenmenger, MD;  Location: [facility] INVASIVE CV LAB;  Service: Cardiovascular;  Laterality: N/A;   RIGHT HEART CATH N/A 03/08/2019   Procedure: RIGHT HEART CATH;  Surgeon: Darlis Eisenmenger, MD;  Location: [facility] INVASIVE CV LAB;  Service: Cardiovascular;  Laterality: N/A;   SUBMUCOSAL TATTOO INJECTION  02/28/2019   Procedure: SUBMUCOSAL TATTOO INJECTION;  Surgeon: Janel Medford, MD;  Location: [facility] ENDOSCOPY;  Service: Endoscopy;;   SUBMUCOSAL TATTOO INJECTION  11/06/2022   Procedure: SUBMUCOSAL TATTOO INJECTION;  Surgeon: Normie Becton., MD;  Location: [facility] ENDOSCOPY;  Service: Gastroenterology;;   TOTAL HIP ARTHROPLASTY Right 05/03/2011   Procedure: TOTAL HIP ARTHROPLASTY ANTERIOR APPROACH;  Surgeon: Arnie Lao;  Location: [facility] ORS;  Service: Orthopedics;  Laterality: Right;  Removal of Cannulated Screws Right Hip, Right Direct Anterior Hip Replacement   TOTAL HIP ARTHROPLASTY Left 10/09/2018   Procedure: TOTAL HIP ARTHROPLASTY ANTERIOR APPROACH;  Surgeon: Adonica Hoose, MD;  Location: [facility] OR;  Service: Orthopedics;  Laterality: Left;    I have reviewed the social history and family history with the patient and they are unchanged from previous  note.  ALLERGIES:  is allergic to codeine, dilaudid  [hydromorphone ], tape, amoxil  Isa.Hirschfeld ], asa [aspirin ], ms contin  [morphine ], neurontin  [gabapentin ], and nsaids.  MEDICATIONS:  Current Outpatient Medications  Medication Sig Dispense Refill   acetaminophen  (TYLENOL ) 500 MG tablet Take 1,000 mg by mouth every 8 (eight) hours.     Ascorbic Acid  (VITAMIN C  PO) Take 1 tablet by mouth daily.     Cholecalciferol  (VITAMIN D -3 PO) Take 2 capsules by mouth daily.     clotrimazole -betamethasone  (LOTRISONE ) cream Apply topically 2 (two) times daily. 30 g 0   CRANBERRY PO Take 1 tablet by mouth daily.     Cyanocobalamin  (B-12 PO) Take 1 tablet by mouth daily.     ferrous gluconate  (FERGON) 324 MG tablet Take 1 tablet (324 mg total) by mouth daily with breakfast. 30 tablet 3   fluticasone  (FLONASE ) 50 MCG/ACT nasal spray Place 2 sprays into both nostrils daily as needed for allergies or rhinitis. 16 g 0   methocarbamol  (ROBAXIN ) 500 MG tablet Take 1 tablet (500 mg total) by mouth every 8 (eight) hours as needed for muscle spasms. 30 tablet 0   metolazone  (ZAROXOLYN ) 2.5 MG tablet Take 1 tablet (2.5 mg total) by mouth 3 (three) times a week. Every Tues., Thurs and Sat. with 40 meq of potassium 90 tablet 3   Multiple Vitamins-Minerals (HAIR SKIN NAILS PO) Take 1 capsule by mouth daily.     pantoprazole  (PROTONIX ) 40 MG tablet Take 1 tablet (40 mg total) by mouth daily. 30 tablet 3   Potassium Chloride  ER 20 MEQ TBCR Take 1 tablet (20 mEq total) by mouth daily. 30 tablet 2   rosuvastatin  (CRESTOR ) 10 MG tablet Take 1 tablet (10 mg total) by mouth daily. 30 tablet 0   torsemide  (DEMADEX ) 20 MG tablet Take 5 tablets (100 mg total) by mouth 2 (two) times daily. 250 tablet 3   No current facility-administered medications for this visit.    PHYSICAL EXAMINATION: ECOG PERFORMANCE STATUS: 2 - Symptomatic, <50% confined to bed  Vitals:   10/01/23 1543  BP: (!) 148/50  Pulse: (!) 56  Resp: 19  Temp:  (!) 97.5 F (36.4 C)  SpO2: 94%   Wt Readings from Last 3 Encounters:  10/01/23 129 lb 4.8 oz (58.7 kg)  09/16/23 129 lb (58.5 kg)  09/03/23 141 lb 5 oz (64.1 kg)     GENERAL:alert, no distress and comfortable SKIN: skin color, texture, turgor are normal, no rashes or significant lesions EYES: normal, Conjunctiva are pink and non-injected, sclera clear NECK: supple, thyroid  normal  size, non-tender, without nodularity LYMPH:  no palpable lymphadenopathy in the cervical, axillary  LUNGS: clear to auscultation and percussion with normal breathing effort HEART: regular rate & rhythm and no murmurs and no lower extremity edema ABDOMEN:abdomen soft, non-tender and normal bowel sounds Musculoskeletal:no cyanosis of digits and no clubbing  NEURO: alert & oriented x 3 with fluent speech, no focal motor/sensory deficits  Physical Exam    LABORATORY DATA:  I have reviewed the data as listed    Latest Ref Rng & Units 10/01/2023    3:05 PM 09/17/2023    6:03 AM 09/16/2023    2:53 PM  CBC  WBC 4.0 - 10.5 K/uL 5.3  20.5  4.0   Hemoglobin 12.0 - 15.0 g/dL 8.8  9.2  7.4   Hematocrit 36.0 - 46.0 % 30.0  31.2  26.2   Platelets 150 - 400 K/uL 203  311  275         Latest Ref Rng & Units 10/01/2023    3:05 PM 09/17/2023    6:03 AM 09/16/2023    2:53 PM  CMP  Glucose 70 - 99 mg/dL 94  161  096   BUN 8 - 23 mg/dL 54  73  72   Creatinine 0.44 - 1.00 mg/dL 0.45  4.09  8.11   Sodium 135 - 145 mmol/L 138  136  134   Potassium 3.5 - 5.1 mmol/L 3.5  4.1  4.5   Chloride 98 - 111 mmol/L 95  93  89   CO2 22 - 32 mmol/L 33  32  37   Calcium  8.9 - 10.3 mg/dL 9.6  9.0  9.4   Total Protein 6.5 - 8.1 g/dL 8.1   7.8   Total Bilirubin 0.0 - 1.2 mg/dL 0.6   1.1   Alkaline Phos 38 - 126 U/L 52   61   AST 15 - 41 U/L 15   20   ALT 0 - 44 U/L 6   12       RADIOGRAPHIC STUDIES: I have personally reviewed the radiological images as listed and agreed with the findings in the report. No results found.     Orders Placed This Encounter  Procedures   CBC with Differential (Cancer Center Only)    Standing Status:   Future    Expected Date:   11/19/2023    Expiration Date:   11/18/2024   Total Protein, Urine dipstick    Standing Status:   Future    Expected Date:   11/19/2023    Expiration Date:   11/18/2024   All questions were answered. The patient knows to call the clinic with any problems, questions or concerns. No barriers to learning was detected. The total time spent in the appointment was 40 minutes, including review of chart and various tests results, discussions about plan of care and coordination of care plan     Sonja Wellington, MD 10/01/2023

## 2023-10-01 NOTE — Telephone Encounter (Signed)
 Called to confirm/remind patient of their appointment at the [facility] on 10/02/23.   Appointment:   [] Confirmed  [] Left mess   [x] No answer/No voice mail  [] VM Full/unable to leave message  [] Phone not in service  Patient reminded to bring all medications and/or complete list.  Confirmed patient has transportation. Gave directions, instructed to utilize valet parking.

## 2023-10-02 ENCOUNTER — Ambulatory Visit: Attending: Cardiology | Admitting: Cardiology

## 2023-10-02 ENCOUNTER — Telehealth: Payer: Self-pay | Admitting: Hematology

## 2023-10-02 VITALS — BP 126/56 | HR 60

## 2023-10-02 DIAGNOSIS — Z87891 Personal history of nicotine dependence: Secondary | ICD-10-CM | POA: Diagnosis not present

## 2023-10-02 DIAGNOSIS — I428 Other cardiomyopathies: Secondary | ICD-10-CM | POA: Insufficient documentation

## 2023-10-02 DIAGNOSIS — I130 Hypertensive heart and chronic kidney disease with heart failure and stage 1 through stage 4 chronic kidney disease, or unspecified chronic kidney disease: Secondary | ICD-10-CM | POA: Insufficient documentation

## 2023-10-02 DIAGNOSIS — I4821 Permanent atrial fibrillation: Secondary | ICD-10-CM | POA: Insufficient documentation

## 2023-10-02 DIAGNOSIS — I272 Pulmonary hypertension, unspecified: Secondary | ICD-10-CM | POA: Diagnosis not present

## 2023-10-02 DIAGNOSIS — Z8616 Personal history of COVID-19: Secondary | ICD-10-CM | POA: Insufficient documentation

## 2023-10-02 DIAGNOSIS — N184 Chronic kidney disease, stage 4 (severe): Secondary | ICD-10-CM | POA: Insufficient documentation

## 2023-10-02 DIAGNOSIS — I5032 Chronic diastolic (congestive) heart failure: Secondary | ICD-10-CM | POA: Diagnosis not present

## 2023-10-02 DIAGNOSIS — R54 Age-related physical debility: Secondary | ICD-10-CM | POA: Insufficient documentation

## 2023-10-02 DIAGNOSIS — I5042 Chronic combined systolic (congestive) and diastolic (congestive) heart failure: Secondary | ICD-10-CM | POA: Diagnosis not present

## 2023-10-02 DIAGNOSIS — I083 Combined rheumatic disorders of mitral, aortic and tricuspid valves: Secondary | ICD-10-CM | POA: Diagnosis not present

## 2023-10-02 DIAGNOSIS — J449 Chronic obstructive pulmonary disease, unspecified: Secondary | ICD-10-CM | POA: Diagnosis not present

## 2023-10-02 LAB — FERRITIN: Ferritin: 46 ng/mL (ref 11–307)

## 2023-10-02 MED ORDER — DOXYCYCLINE HYCLATE 100 MG PO CAPS: 100.0000 mg | ORAL_CAPSULE | Freq: Two times a day (BID) | ORAL | 0 refills | Status: AC

## 2023-10-02 MED ORDER — TRAMADOL HCL 50 MG PO TABS: 50.0000 mg | ORAL_TABLET | Freq: Two times a day (BID) | ORAL | 0 refills | Status: DC | PRN

## 2023-10-02 NOTE — Patient Instructions (Signed)
 Medication Changes:  START Doxycycline  100mg  (1 tab) two times daily    Follow-Up in: Please follow up with the [facility] in 2 weeks with Shawnee Dellen, FNP.  At the [facility], you and your health needs are our priority. We have a designated team specialized in the treatment of Heart Failure. This Care Team includes your primary Heart Failure Specialized Cardiologist (physician), Advanced Practice Providers (APPs- Physician Assistants and Nurse Practitioners), and Pharmacist who all work together to provide you with the care you need, when you need it.   You may see any of the following providers on your designated Care Team at your next follow up:  Dr. Jules Oar Dr. Peder Bourdon Dr. Alwin Baars Dr. Judyth Nunnery Shawnee Dellen, FNP Bevely Brush, RPH-CPP  Please be sure to bring in all your medications bottles to every appointment.   Need to Contact Us :  If you have any questions or concerns before your next appointment please send us  a message through [facility] or call our office at [phone].    TO LEAVE A MESSAGE FOR THE NURSE SELECT OPTION 2, PLEASE LEAVE A MESSAGE INCLUDING: YOUR NAME DATE OF BIRTH CALL BACK NUMBER REASON FOR CALL**this is important as we prioritize the call backs  YOU WILL RECEIVE A CALL BACK THE SAME DAY AS LONG AS YOU CALL BEFORE 4:00 PM

## 2023-10-02 NOTE — Progress Notes (Signed)
 ADVANCED HF CLINIC NOTE    ID:  [name], DOB 1934/02/16, MRN [id number]    PCP:  Claire Crick, MD (last seen 10/24) Cardiologist:  Richardo Chandler, MD (last seen 11/24) Primary HF: Dr. Mitzie Anda (last seen 12/24)  Chief Complaint: CHF   History of Present Illness:  89 y.o. with history of permanent atrial fibrillation, chronic diastolic CHF, CKD stage 3, smoking/prior COPD, renal cell CA s/p right nephrectomy. She has a prior history of HFrEF with nonischemic cardiomyopathy and had a Medtronic ICD placed. However, subsequently her EF has recovered.    06/20, she had left hip ORIF after mechanical fall.  Echo in 06/20 showed EF 60-65%, mild RV dilation with normal systolic function, PASP 93 mmHg.  After she got home from the hospital stay post-ORIF, she noted marked peripheral edema. She was short of breath walking short distances.  +orthopnea. She came back to the hospital 07/20 and was admitted with volume overload. She was diuresed aggressively and lost about 22 lbs. RHC showed pulmonary venous hypertension. PCWP tracing had prominent v-waves in absence of significant MR, suggesting significant diastolic dysfunction. Noted to have significant Fe deficiency anemia, but FOBT was negative during 7/20 hospitalization.   Admitted 11/20 with symptomatic anemia, GI workup concerning for ischemic bowel. 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 Kermit Ped with VVS and conservative management recommended. Capsule endoscopy showed no definite source of bleeding. She was volume overloaded due to significant RV dysfunction and diuresed, she also developed AKI on CKD stage 3.      10/22, she had bilateral CIA stents placed. ABIs in 11/22 showed patent stents. She had a skin cancer removed from her left leg. 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   Echo 2/23 EF 65-70% with mild LVH, D-shaped septum with moderate RV enlargement, and mildly  decreased RV function, PASP 92, moderate-severe TR, dilated IVC, moderate aortic stenosis. Echo in 10/23 showed EF 60-65%, D-shaped septum, mild RV enlargement with normal RV systolic function, PASP 90, moderate-severe TR, mild AS, IVC dilated.   Patient was found to have a neuroendocrine tumor of the pancreatic head in the fall of 2023.  She has been treated with sandostatin  monthly.  She also had further GI bleeding from small bowel AVMs in fall 2023. She also had COVID-19 in 10/23 and has been on home oxygen  2L since that time.   Follow up with Dr. Rodolfo Clan, long discussion about EOL. Given DNR form and high-voltage therapies inactivated on ICD.  Admit 07/24 with acute on chronic blood loss anemia 2/2 GI bleed. Transfused and given IV iron . Had small bowel enteroscopy which showed 2 nonbleeding angiodysplastic lesions in duodenum and 3 nonbleeding angioplastic lesions in jejunum treated with APC.  Admitted early 09/24 with acute on chronic anemia suspected 2/2 chronic GI blood loss. Hgb down to 6.8. Received 2 u RBCs. GI did not recommend any endoscopic procedures d/t advanced age and comorbidities. Course further c/b acute on chronic CHF. She was diuresed with IV lasix  and metolazone . Echo during admit with LVEF 65-70%, D-shaped septum, mildly reduced RV, RVSP 103 mmHg, biatrial enlargement, severe TR, moderate mitral stenosis with mean gradient of 6 mmHg, dilated IVC.  She was seen back in the [facility] on 01/30/23 for post hospital f/u and noted recurrent melena, SOB and fatigued w/ minimal exertion. Also noted to have recurrent volume overload w/ marked return of LEE. Hgb was down to 6.6. She was sent  back to the ED and readmitted again for a/c CHF and GIB, treated w/ blood transfusions, IV Fe and diuretics. Discharged on 02/06/23 on torsemide  100 mg bid.   S/p 2u RBCs 04/26/23 for symptomatic anemia. Hgb 6.8.   Admitted 05/27/23 due to shortness of breath/ leg swelling. Initially given IV lasix   with  removal of ~ 4Land transitioned to oral torsemide  at 100mg  BID at discharge. Metolazone  decreased to weekly. Treated with antibiotics for leg cellulitis. Given 1 unit PRBC's due to hemoglobin trending down to 7.1. Wound care consulted due to weeping area on left shin. Palliative care also consulted.   She was admitted in 2/25 with lower leg cellulitis.  She was treated with IV abx. She was admitted again in 4/25 with lower leg cellulitis.  She was admitted in 5/25 with GI bleeding/anemia.   Echo in 5/25 showed EF 60-65%, mild LVH, d-shaped interventricular septum, severe RV enlargement with moderately decreased RV systolic function, PASP 96 mmHg, severe TR, mild MR with moderate MS (mean gradient 5 mmHg), moderate AS, IVC dilated.   She returns today for followup of CHF.  Lower legs are more red and weeping again with odor that concerns her for infection. She has an appointment with wound clinic on Monday.  She does not walk much, no dyspnea walking short distances but gets fatigued and dyspneic if she walks more than about 30-40 feet. No further BRBPR/melena. No palpitations.  No lightheadedness/syncope.   Labs (10/23): magnesium  1.7 Labs (3/24): TSH 2.93 Labs (4/24): K 3.9, creatinine 1.69, ALT 9, hgb 10, Plt 175 Labs (8/24): K 4.5, creatinine, 1.6 Labs (10/24): K 4.1, creatinine 1.55, Hgb 7.6 Labs (11/24): K 4.6, SCr 1.52 Labs (11/24): K 4.6, SCr 1.82 => 1.78, TSat 3, ferritin 16 Labs (12/24): K 3.9, creatinine 1.83 Labs (01/25): K 4.2, creatinine 1.89 Labs (2/25): hgb 8.5, BNP 517, K 3.5, creatinine 1.86 Labs (3/25): K 4, creatinine 2.13, hgb 7.8 Labs (5/25): K 3.5, creatinine 1.71 => 2.39  PMH: 1. COPD 2. Renal cell carcinoma: S/p right kidney resection.  3. CKD stage 3 4. Fe deficiency anemia 5. Atrial fibrillation: Permanent. She has failed amiodarone , Tikosyn , and flecainide  as well as multiple cardioversions. 6. Chronic diastolic CHF: She has history of prior HFrEF with  nonischemic cardiomyopathy.  She has a Medtronic ICD.   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  - Echo (6/20): EF 60-65%, mildly dilated RV with PASP 93 mmHg, mild-moderate TR.  - RHC (7/20): mean RA 11, PA 64/18 mean 39, mean PCWP 24 with v waves to 41, CI 4.21, PVR 1.9 WU.  - Cardiomems placement - RHC (11/20): mean RA 13, PA 63/23 mean 25 with v waves to 47 (MR only mild by echo), mean PCWP 25, CI 3.18, PVR 2.6 WU - Echo (11/21): EF 65-70%, moderate LVH, D-shaped septum with mildly decreased RV systolic function, RVSP 85.9 mmHg, severe RAE, severe TR, mod-sev AS. - Echo (2/23): EF 65-70% with mild LVH, D-shaped septum with moderate RV enlargement, and mildly decreased RV function, PASP 92, moderate-severe TR, dilated IVC, moderate aortic stenosis.  - Echo (10/23): EF 60-65%, D-shaped septum, mild RV enlargement with normal RV systolic function, PASP 90, moderate-severe TR, mild AS, IVC dilated.  - Echo (9/24): LVEF 65-70%, D-shaped septum, mildly reduced RV, RVSP 103 mmHg, biatrial enlargement, severe TR, moderate mitral stenosis with mean gradient of 6 mmHg, dilated IVC - Echo (01/25): EF 65-70%, mild LVH, RV moderately reduced, severely elevated PA pressure 83.9 mmHg, mild MR,  moderate MS with mean gradient of 6.0 mmHg, IVC dialted - Echo (5/25): EF 60-65%, mild LVH, d-shaped interventricular septum, severe RV enlargement with moderately decreased RV systolic function, PASP 96 mmHg, severe TR, mild MR with moderate MS (mean gradient 5 mmHg), moderate AS, IVC dilated.  7. Hyperlipidemia 8. Carotid stenosis: Carotid dopplers (3/19) with 40-59% LICA stenosis.   9. Possible ischemic bowel in 11/20 10. 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 Conservative management per Dr. Kermit Ped.  - 10/22 bilateral CIA stents.  - 11/22 ABIs: 1.07 left, 0.83 right with patent CIA stents.  11. 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  12. Squamous cell  skin cancer.  13. Neuroendocrine tumor of pancreatic head: Treating with sandostatin .  14. COVID 19 in 10/23 15. Tricuspid regurgitation: Severe on 5/25 echo.  16. Aortic stenosis: Moderate on 5/25 echo.  17. Mitral stenosis: Moderate (mean gradient 5 mmHg) on 5/25 echo.     Current Outpatient Medications on File Prior to Visit  Medication Sig Dispense Refill   acetaminophen  (TYLENOL ) 500 MG tablet Take 1,000 mg by mouth every 8 (eight) hours.     Ascorbic Acid  (VITAMIN C  PO) Take 1 tablet by mouth daily.     Cholecalciferol  (VITAMIN D -3 PO) Take 2 capsules by mouth daily.     clotrimazole -betamethasone  (LOTRISONE ) cream Apply topically 2 (two) times daily. 30 g 0   CRANBERRY PO Take 1 tablet by mouth daily.     Cyanocobalamin  (B-12 PO) Take 1 tablet by mouth daily.     ferrous gluconate  (FERGON) 324 MG tablet Take 1 tablet (324 mg total) by mouth daily with breakfast. 30 tablet 3   fluticasone  (FLONASE ) 50 MCG/ACT nasal spray Place 2 sprays into both nostrils daily as needed for allergies or rhinitis. 16 g 0   methocarbamol  (ROBAXIN ) 500 MG tablet Take 1 tablet (500 mg total) by mouth every 8 (eight) hours as needed for muscle spasms. 30 tablet 0   metolazone  (ZAROXOLYN ) 2.5 MG tablet Take 1 tablet (2.5 mg total) by mouth 3 (three) times a week. Every Tues., Thurs and Sat. with 40 meq of potassium 90 tablet 3   Multiple Vitamins-Minerals (HAIR SKIN NAILS PO) Take 1 capsule by mouth daily.     pantoprazole  (PROTONIX ) 40 MG tablet Take 1 tablet (40 mg total) by mouth daily. 30 tablet 3   Potassium Chloride  ER 20 MEQ TBCR Take 1 tablet (20 mEq total) by mouth daily. 30 tablet 2   rosuvastatin  (CRESTOR ) 10 MG tablet Take 1 tablet (10 mg total) by mouth daily. 30 tablet 0   torsemide  (DEMADEX ) 20 MG tablet Take 5 tablets (100 mg total) by mouth 2 (two) times daily. 250 tablet 3   No current facility-administered medications on file prior to visit.     Allergies:   Dilaudid  [hydromorphone ],  Fentanyl , Codeine, Morphine  and related, Amoxicillin , Hydrocodone , Oxycodone , Tramadol , Whole blood, and Sulfonamide derivatives    Social History:  The patient  reports that she quit smoking about 21 years ago. Her smoking use included cigarettes. She has a 20.00 pack-year smoking history. She has never used smokeless tobacco. She reports that she does not drink alcohol and does not use drugs.    Family History:  The patient's family history includes Breast cancer in her cousin and another family member; Breast cancer (age of onset: 104) in her maternal aunt; Early death in her father; Heart failure in her mother.    ROS:  Please see the history of present illness.   All other systems are personally reviewed and negative.    Vitals:   10/02/23 1505  BP: (!) 126/56  Pulse: 60  SpO2: 100%   Wt Readings from Last 3 Encounters:  10/01/23 129 lb 4.8 oz (58.7 kg)  09/16/23 129 lb (58.5 kg)  09/03/23 141 lb 5 oz (64.1 kg)   Lab Results  Component Value Date   CREATININE 2.39 (H) 10/01/2023   CREATININE 1.71 (H) 09/17/2023   CREATININE 1.65 (H) 09/16/2023    PHYSICAL EXAM: General: NAD Neck: JVP 8-9 cm, no thyromegaly or thyroid  nodule.  Lungs: Clear to auscultation bilaterally with normal respiratory effort. CV: Nondisplaced PMI.  Heart regular S1/S2, no S3/S4, 3/6 systolic murmur along the sternal border.  2+ chronic edema to knees with ulcerations and weeping.  No carotid bruit.  Unable to palpate pedal pulses.  Abdomen: Soft, nontender, no hepatosplenomegaly, no distention.  Skin: Erythema and weeping lower legs bilaterally.   Neurologic: Alert and oriented x 3.  Psych: Normal affect. Extremities: No clubbing or cyanosis.  HEENT: Normal.   ASSESSMENT AND PLAN:  1. Chronic diastolic CHF: With prominent RV failure. She had remote nonischemic cardiomyopathy with recovery of EF, has Medtronic ICD. Atrial fibrillation likely contributes, but this appears to be permanent now as she has  failed multiple anti-arrhythmics and is reasonably rate-controlled. RHC 11/20 showed elevated filling pressures and pulmonary venous hypertension. Prominent v-waves on PCWP tracing likely due to stiff ventricle/diastolic dysfunction as she had only mild MR on echo. She is RV pacing at a high percentage, but LV EF has remained normal. Most recent echo in 5/25 showed EF 60-65%, mild LVH, d-shaped interventricular septum, severe RV enlargement with moderately decreased RV systolic function, PASP 96 mmHg, severe TR, mild MR with moderate MS (mean gradient 5 mmHg), moderate AS, IVC dilated. NYHA class 3 to 3b, worse when she is anemic. She does not look markedly volume overloaded today, only mild JVD in setting of severe TR.  Lower extremity edema is chronic.   - Continue metolazone  2.5 mg three times weekly.  - Continue torsemide  100 mg bid and KCl 20 daily (40 on metolazone  days).  She had BMET this week.  - Off SGLT2i after UTIs 2. Hypertension: BP not elevated.  3. Pulmonary hypertension:  Based on RHC 11/20, she has primarily pulmonary venous hypertension, not candidate for pulmonary vasodilators.  4. CKD: Stage 4. Off SGLT2i.  Most recent creatinine 2.39.  5. Chronic anemia/GI bleeding: Due to small bowel AVMs. She has had transfusions and IV Fe, she was most recently admitted for this in 5/25.  - She is now off anticoagulation.  6. Atrial fibrillation: Permanent. Rate control is reasonable off nodal blockers. She has failed amiodarone , Tikosyn , and flecainide  as well as multiple cardioversions.  - Off Eliquis  with recurrent GI bleeding and transfusions.  - Not Watchman candidate due to frailty and poorly compensated CHF.  7. Neuroendocrine tumor of pancreatic head - Ongoing treatment per oncology.  8. Tricuspid regurgitation: Severe on 5/25 echo.   9. Mitral stenosis: Moderate on 5/25 echo.  - Follow, not surgical candidate.  10. Aortic stenosis: Moderate on 5/25 echo.  11. Cellulitis: I worry  that she has developed a recurrent lower extremity cellulitis.   - I will start her on doxycycline .   - She has followup in wound clinic Monday.   Patient is very frail with multiple comorbidities.  Goals of care discussions are appropriate, so far  she has wanted to remain full code and is not ready for hospice.  I worry that she will not stay out of the hospital for long.   Followup in 2 months with APP.   I spent 31 minutes reviewing records, interviewing/examining patient, and managing orders.    Peder Bourdon, 10/02/23

## 2023-10-03 ENCOUNTER — Encounter

## 2023-10-06 ENCOUNTER — Encounter: Attending: Physician Assistant | Admitting: Physician Assistant

## 2023-10-06 DIAGNOSIS — C253 Malignant neoplasm of pancreatic duct: Secondary | ICD-10-CM | POA: Insufficient documentation

## 2023-10-06 DIAGNOSIS — I5042 Chronic combined systolic (congestive) and diastolic (congestive) heart failure: Secondary | ICD-10-CM | POA: Diagnosis not present

## 2023-10-06 DIAGNOSIS — N184 Chronic kidney disease, stage 4 (severe): Secondary | ICD-10-CM | POA: Diagnosis not present

## 2023-10-06 DIAGNOSIS — I87332 Chronic venous hypertension (idiopathic) with ulcer and inflammation of left lower extremity: Secondary | ICD-10-CM | POA: Insufficient documentation

## 2023-10-06 DIAGNOSIS — I890 Lymphedema, not elsewhere classified: Secondary | ICD-10-CM | POA: Insufficient documentation

## 2023-10-06 DIAGNOSIS — Z950 Presence of cardiac pacemaker: Secondary | ICD-10-CM | POA: Diagnosis not present

## 2023-10-06 DIAGNOSIS — L97822 Non-pressure chronic ulcer of other part of left lower leg with fat layer exposed: Secondary | ICD-10-CM | POA: Insufficient documentation

## 2023-10-06 DIAGNOSIS — L97522 Non-pressure chronic ulcer of other part of left foot with fat layer exposed: Secondary | ICD-10-CM | POA: Diagnosis not present

## 2023-10-06 DIAGNOSIS — I7389 Other specified peripheral vascular diseases: Secondary | ICD-10-CM | POA: Diagnosis not present

## 2023-10-07 DIAGNOSIS — I87332 Chronic venous hypertension (idiopathic) with ulcer and inflammation of left lower extremity: Secondary | ICD-10-CM | POA: Diagnosis not present

## 2023-10-08 ENCOUNTER — Inpatient Hospital Stay: Attending: Nurse Practitioner

## 2023-10-08 ENCOUNTER — Telehealth: Payer: Self-pay

## 2023-10-08 ENCOUNTER — Inpatient Hospital Stay

## 2023-10-08 VITALS — BP 137/46 | HR 59 | Temp 97.9°F | Resp 16

## 2023-10-08 DIAGNOSIS — K5520 Angiodysplasia of colon without hemorrhage: Secondary | ICD-10-CM

## 2023-10-08 DIAGNOSIS — N184 Chronic kidney disease, stage 4 (severe): Secondary | ICD-10-CM | POA: Insufficient documentation

## 2023-10-08 DIAGNOSIS — D631 Anemia in chronic kidney disease: Secondary | ICD-10-CM | POA: Diagnosis not present

## 2023-10-08 DIAGNOSIS — D649 Anemia, unspecified: Secondary | ICD-10-CM

## 2023-10-08 LAB — SAMPLE TO BLOOD BANK

## 2023-10-08 LAB — CBC WITH DIFFERENTIAL (CANCER CENTER ONLY)
Abs Immature Granulocytes: 0.01 10*3/uL (ref 0.00–0.07)
Basophils Absolute: 0 10*3/uL (ref 0.0–0.1)
Basophils Relative: 1 %
Eosinophils Absolute: 0 10*3/uL (ref 0.0–0.5)
Eosinophils Relative: 1 %
HCT: 27.6 % — ABNORMAL LOW (ref 36.0–46.0)
Hemoglobin: 8.2 g/dL — ABNORMAL LOW (ref 12.0–15.0)
Immature Granulocytes: 0 %
Lymphocytes Relative: 17 %
Lymphs Abs: 0.6 10*3/uL — ABNORMAL LOW (ref 0.7–4.0)
MCH: 23.2 pg — ABNORMAL LOW (ref 26.0–34.0)
MCHC: 29.7 g/dL — ABNORMAL LOW (ref 30.0–36.0)
MCV: 78.2 fL — ABNORMAL LOW (ref 80.0–100.0)
Monocytes Absolute: 0.6 10*3/uL (ref 0.1–1.0)
Monocytes Relative: 16 %
Neutro Abs: 2.3 10*3/uL (ref 1.7–7.7)
Neutrophils Relative %: 65 %
Platelet Count: 197 10*3/uL (ref 150–400)
RBC: 3.53 MIL/uL — ABNORMAL LOW (ref 3.87–5.11)
RDW: 20.6 % — ABNORMAL HIGH (ref 11.5–15.5)
WBC Count: 3.6 10*3/uL — ABNORMAL LOW (ref 4.0–10.5)
nRBC: 0 % (ref 0.0–0.2)

## 2023-10-08 LAB — TOTAL PROTEIN, URINE DIPSTICK: Protein, ur: NEGATIVE mg/dL

## 2023-10-08 MED ORDER — SODIUM CHLORIDE 0.9 % IV SOLN
5.0000 mg/kg | Freq: Once | INTRAVENOUS | Status: AC
  Administered 2023-10-08: 300 mg via INTRAVENOUS
  Filled 2023-10-08: qty 12

## 2023-10-08 MED ORDER — SODIUM CHLORIDE 0.9 % IV SOLN: Freq: Once | INTRAVENOUS | Status: AC

## 2023-10-08 NOTE — Telephone Encounter (Signed)
 Can you help me verify if patient should still be under remote monitoring?   Dr. Montez Ape recent OV note states:  Follow up with Dr. Rodolfo Clan, long discussion about EOL. Given DNR form and high-voltage therapies inactivated on ICD. (06/13/22 SK office note).   However, at the end of Dr. Charline Contras note on 10/02/23 states patient is still a full code and did not want to go on hospice.  Remote appts are still scheduled but her remote monitor is not communicating.   Patient is past due for annual in office check with EP.  I would recommend we get her set up in office for follow up with provider and re-visit end of life discussion if appropriate and ensure remote monitor is working and whether ICD therapies should remain off if no DNR is in place now.   Patient will now be a patient of Dr. Albertine Alpha.  Will copy him on this as well.

## 2023-10-08 NOTE — Patient Instructions (Signed)
 [facility] - A DEPT OF [facility]  Discharge Instructions: Thank you for choosing [facility] to provide your oncology and hematology care.   If you have a lab appointment with the Cancer Center, please go directly to the Cancer Center and check in at the registration area.   Wear comfortable clothing and clothing appropriate for easy access to any Portacath or PICC line.   We strive to give you quality time with your provider. You may need to reschedule your appointment if you arrive late (15 or more minutes).  Arriving late affects you and other patients whose appointments are after yours.  Also, if you miss three or more appointments without notifying the office, you may be dismissed from the clinic at the provider's discretion.      For prescription refill requests, have your pharmacy contact our office and allow 72 hours for refills to be completed.    Today you received the following chemotherapy and/or immunotherapy agents Bevacizumab      To help prevent nausea and vomiting after your treatment, we encourage you to take your nausea medication as directed.  BELOW ARE SYMPTOMS THAT SHOULD BE REPORTED IMMEDIATELY: *FEVER GREATER THAN 100.4 F (38 C) OR HIGHER *CHILLS OR SWEATING *NAUSEA AND VOMITING THAT IS NOT CONTROLLED WITH YOUR NAUSEA MEDICATION *UNUSUAL SHORTNESS OF BREATH *UNUSUAL BRUISING OR BLEEDING *URINARY PROBLEMS (pain or burning when urinating, or frequent urination) *BOWEL PROBLEMS (unusual diarrhea, constipation, pain near the anus) TENDERNESS IN MOUTH AND THROAT WITH OR WITHOUT PRESENCE OF ULCERS (sore throat, sores in mouth, or a toothache) UNUSUAL RASH, SWELLING OR PAIN  UNUSUAL VAGINAL DISCHARGE OR ITCHING   Items with * indicate a potential emergency and should be followed up as soon as possible or go to the Emergency Department if any problems should occur.  Please show the CHEMOTHERAPY ALERT CARD or IMMUNOTHERAPY  ALERT CARD at check-in to the Emergency Department and triage nurse.  Should you have questions after your visit or need to cancel or reschedule your appointment, please contact [facility] - A DEPT OF [facility]  Dept: [phone]  and follow the prompts.  Office hours are 8:00 a.m. to 4:30 p.m. Monday - Friday. Please note that voicemails left after 4:00 p.m. may not be returned until the following business day.  We are closed weekends and major holidays. You have access to a nurse at all times for urgent questions. Please call the main number to the clinic Dept: [phone] and follow the prompts.   For any non-urgent questions, you may also contact your provider using MyChart. We now offer e-Visits for anyone 26 and older to request care online for non-urgent symptoms. For details visit [email or web address].   Also download the MyChart app! Go to the app store, search "MyChart", open the app, select [facility], and log in with your MyChart username and password.  Bevacizumab Injection What is this medication? BEVACIZUMAB (be va SIZ yoo mab) treats some types of cancer. It works by blocking a protein that causes cancer cells to grow and multiply. This helps to slow or stop the spread of cancer cells. It is a monoclonal antibody. This medicine may be used for other purposes; ask your health care provider or pharmacist if you have questions. COMMON BRAND NAME(S): Alymsys, Avastin, MVASI, Rosaland Lao What should I tell my care team before I take this medication? They need to know if you  have any of these conditions: Blood clots Coughing up blood Having or recent surgery Heart failure High blood pressure History of a connection between 2 or more body parts that do not usually connect (fistula) History of a tear in your stomach or intestines Protein in your urine An unusual or allergic reaction to bevacizumab, other medications, foods, dyes, or  preservatives Pregnant or trying to get pregnant Breast-feeding How should I use this medication? This medication is injected into a vein. It is given by your care team in a hospital or clinic setting. Talk to your care team the use of this medication in children. Special care may be needed. Overdosage: If you think you have taken too much of this medicine contact a poison control center or emergency room at once. NOTE: This medicine is only for you. Do not share this medicine with others. What if I miss a dose? Keep appointments for follow-up doses. It is important not to miss your dose. Call your care team if you are unable to keep an appointment. What may interact with this medication? Interactions are not expected. This list may not describe all possible interactions. Give your health care provider a list of all the medicines, herbs, non-prescription drugs, or dietary supplements you use. Also tell them if you smoke, drink alcohol, or use illegal drugs. Some items may interact with your medicine. What should I watch for while using this medication? Your condition will be monitored carefully while you are receiving this medication. You may need blood work while taking this medication. This medication may make you feel generally unwell. This is not uncommon as chemotherapy can affect healthy cells as well as cancer cells. Report any side effects. Continue your course of treatment even though you feel ill unless your care team tells you to stop. This medication may increase your risk to bruise or bleed. Call your care team if you notice any unusual bleeding. Before having surgery, talk to your care team to make sure it is ok. This medication can increase the risk of poor healing of your surgical site or wound. You will need to stop this medication for 28 days before surgery. After surgery, wait at least 28 days before restarting this medication. Make sure the surgical site or wound is healed enough  before restarting this medication. Talk to your care team if questions. Talk to your care team if you may be pregnant. Serious birth defects can occur if you take this medication during pregnancy and for 6 months after the last dose. Contraception is recommended while taking this medication and for 6 months after the last dose. Your care team can help you find the option that works for you. Do not breastfeed while taking this medication and for 6 months after the last dose. This medication can cause infertility. Talk to your care team if you are concerned about your fertility. What side effects may I notice from receiving this medication? Side effects that you should report to your care team as soon as possible: Allergic reactions--skin rash, itching, hives, swelling of the face, lips, tongue, or throat Bleeding--bloody or black, tar-like stools, vomiting blood or brown material that looks like coffee grounds, red or dark brown urine, small red or purple spots on skin, unusual bruising or bleeding Blood clot--pain, swelling, or warmth in the leg, shortness of breath, chest pain Heart attack--pain or tightness in the chest, shoulders, arms, or jaw, nausea, shortness of breath, cold or clammy skin, feeling faint or lightheaded Heart failure--shortness  of breath, swelling of the ankles, feet, or hands, sudden weight gain, unusual weakness or fatigue Increase in blood pressure Infection--fever, chills, cough, sore throat, wounds that don't heal, pain or trouble when passing urine, general feeling of discomfort or being unwell Infusion reactions--chest pain, shortness of breath or trouble breathing, feeling faint or lightheaded Kidney injury--decrease in the amount of urine, swelling of the ankles, hands, or feet Stomach pain that is severe, does not go away, or gets worse Stroke--sudden numbness or weakness of the face, arm, or leg, trouble speaking, confusion, trouble walking, loss of balance or  coordination, dizziness, severe headache, change in vision Sudden and severe headache, confusion, change in vision, seizures, which may be signs of posterior reversible encephalopathy syndrome (PRES) Side effects that usually do not require medical attention (report to your care team if they continue or are bothersome): Back pain Change in taste Diarrhea Dry skin Increased tears Nosebleed This list may not describe all possible side effects. Call your doctor for medical advice about side effects. You may report side effects to FDA at 1-800-FDA-1088. Where should I keep my medication? This medication is given in a hospital or clinic. It will not be stored at home. NOTE: This sheet is a summary. It may not cover all possible information. If you have questions about this medicine, talk to your doctor, pharmacist, or health care provider.  2024 Elsevier/Gold Standard (2021-09-07 00:00:00)

## 2023-10-09 ENCOUNTER — Telehealth: Payer: Self-pay | Admitting: Hematology

## 2023-10-10 ENCOUNTER — Inpatient Hospital Stay

## 2023-10-13 ENCOUNTER — Other Ambulatory Visit: Payer: Self-pay

## 2023-10-13 DIAGNOSIS — D649 Anemia, unspecified: Secondary | ICD-10-CM

## 2023-10-13 DIAGNOSIS — D3A8 Other benign neuroendocrine tumors: Secondary | ICD-10-CM

## 2023-10-13 DIAGNOSIS — D500 Iron deficiency anemia secondary to blood loss (chronic): Secondary | ICD-10-CM

## 2023-10-13 DIAGNOSIS — K5520 Angiodysplasia of colon without hemorrhage: Secondary | ICD-10-CM

## 2023-10-14 ENCOUNTER — Other Ambulatory Visit: Payer: Self-pay

## 2023-10-14 ENCOUNTER — Inpatient Hospital Stay

## 2023-10-14 ENCOUNTER — Encounter: Admitting: Physician Assistant

## 2023-10-14 DIAGNOSIS — D649 Anemia, unspecified: Secondary | ICD-10-CM

## 2023-10-14 DIAGNOSIS — I87332 Chronic venous hypertension (idiopathic) with ulcer and inflammation of left lower extremity: Secondary | ICD-10-CM | POA: Diagnosis not present

## 2023-10-14 DIAGNOSIS — K5520 Angiodysplasia of colon without hemorrhage: Secondary | ICD-10-CM

## 2023-10-14 DIAGNOSIS — D500 Iron deficiency anemia secondary to blood loss (chronic): Secondary | ICD-10-CM

## 2023-10-14 DIAGNOSIS — L97522 Non-pressure chronic ulcer of other part of left foot with fat layer exposed: Secondary | ICD-10-CM | POA: Diagnosis not present

## 2023-10-14 DIAGNOSIS — I890 Lymphedema, not elsewhere classified: Secondary | ICD-10-CM | POA: Diagnosis not present

## 2023-10-14 DIAGNOSIS — D3A8 Other benign neuroendocrine tumors: Secondary | ICD-10-CM

## 2023-10-14 DIAGNOSIS — L97822 Non-pressure chronic ulcer of other part of left lower leg with fat layer exposed: Secondary | ICD-10-CM | POA: Diagnosis not present

## 2023-10-14 DIAGNOSIS — N184 Chronic kidney disease, stage 4 (severe): Secondary | ICD-10-CM | POA: Diagnosis not present

## 2023-10-14 LAB — SAMPLE TO BLOOD BANK

## 2023-10-14 LAB — CBC WITH DIFFERENTIAL (CANCER CENTER ONLY)
Abs Immature Granulocytes: 0.02 10*3/uL (ref 0.00–0.07)
Basophils Absolute: 0 10*3/uL (ref 0.0–0.1)
Basophils Relative: 1 %
Eosinophils Absolute: 0 10*3/uL (ref 0.0–0.5)
Eosinophils Relative: 0 %
HCT: 26.4 % — ABNORMAL LOW (ref 36.0–46.0)
Hemoglobin: 7.7 g/dL — ABNORMAL LOW (ref 12.0–15.0)
Immature Granulocytes: 1 %
Lymphocytes Relative: 14 %
Lymphs Abs: 0.5 10*3/uL — ABNORMAL LOW (ref 0.7–4.0)
MCH: 22.8 pg — ABNORMAL LOW (ref 26.0–34.0)
MCHC: 29.2 g/dL — ABNORMAL LOW (ref 30.0–36.0)
MCV: 78.1 fL — ABNORMAL LOW (ref 80.0–100.0)
Monocytes Absolute: 0.3 10*3/uL (ref 0.1–1.0)
Monocytes Relative: 10 %
Neutro Abs: 2.5 10*3/uL (ref 1.7–7.7)
Neutrophils Relative %: 74 %
Platelet Count: 157 10*3/uL (ref 150–400)
RBC: 3.38 MIL/uL — ABNORMAL LOW (ref 3.87–5.11)
RDW: 19.9 % — ABNORMAL HIGH (ref 11.5–15.5)
WBC Count: 3.3 10*3/uL — ABNORMAL LOW (ref 4.0–10.5)
nRBC: 0 % (ref 0.0–0.2)

## 2023-10-14 LAB — PREPARE RBC (CROSSMATCH)

## 2023-10-14 NOTE — Telephone Encounter (Signed)
 See doctor Daneil Dunker note

## 2023-10-15 ENCOUNTER — Other Ambulatory Visit

## 2023-10-15 NOTE — Telephone Encounter (Signed)
 After being unsuccessful in reaching patient via her contacts, as well as her daughter Caryl Clas - I reached patients granddaughter [name], patient has been scheduled with EP APP [facility] on 7/8 for follow up and to discuss if gen change is needed.

## 2023-10-16 ENCOUNTER — Inpatient Hospital Stay: Admitting: Physician Assistant

## 2023-10-16 ENCOUNTER — Inpatient Hospital Stay

## 2023-10-16 VITALS — BP 149/57 | HR 60 | Temp 98.0°F | Resp 14

## 2023-10-16 VITALS — BP 152/78 | HR 60 | Temp 97.7°F | Resp 18

## 2023-10-16 DIAGNOSIS — D500 Iron deficiency anemia secondary to blood loss (chronic): Secondary | ICD-10-CM

## 2023-10-16 DIAGNOSIS — D649 Anemia, unspecified: Secondary | ICD-10-CM

## 2023-10-16 DIAGNOSIS — T148XXA Other injury of unspecified body region, initial encounter: Secondary | ICD-10-CM

## 2023-10-16 DIAGNOSIS — D3A8 Other benign neuroendocrine tumors: Secondary | ICD-10-CM

## 2023-10-16 DIAGNOSIS — N184 Chronic kidney disease, stage 4 (severe): Secondary | ICD-10-CM | POA: Diagnosis not present

## 2023-10-16 LAB — TYPE AND SCREEN
ABO/RH(D): A NEG
Antibody Screen: POSITIVE
DAT, IgG: NEGATIVE
Unit division: 0

## 2023-10-16 LAB — BPAM RBC
Blood Product Expiration Date: 202507042359
ISSUE DATE / TIME: 202506121118
Unit Type and Rh: 202507042359
Unit Type and Rh: 600

## 2023-10-16 MED ORDER — FAMOTIDINE IN NACL 20-0.9 MG/50ML-% IV SOLN
20.0000 mg | Freq: Once | INTRAVENOUS | Status: AC
  Administered 2023-10-16: 20 mg via INTRAVENOUS
  Filled 2023-10-16: qty 50

## 2023-10-16 MED ORDER — CEPHALEXIN 500 MG PO CAPS: 500.0000 mg | ORAL_CAPSULE | Freq: Two times a day (BID) | ORAL | 0 refills | Status: AC

## 2023-10-16 MED ORDER — ACETAMINOPHEN 325 MG PO TABS
650.0000 mg | ORAL_TABLET | Freq: Once | ORAL | Status: AC
  Administered 2023-10-16: 650 mg via ORAL
  Filled 2023-10-16: qty 2

## 2023-10-16 MED ORDER — EPOETIN ALFA-EPBX 40000 UNIT/ML IJ SOLN
40000.0000 [IU] | Freq: Once | INTRAMUSCULAR | Status: AC
  Administered 2023-10-16: 40000 [IU] via SUBCUTANEOUS
  Filled 2023-10-16: qty 1

## 2023-10-16 MED ORDER — FUROSEMIDE 10 MG/ML IJ SOLN
20.0000 mg | Freq: Once | INTRAMUSCULAR | Status: AC
  Administered 2023-10-16: 20 mg via INTRAVENOUS
  Filled 2023-10-16: qty 2

## 2023-10-16 MED ORDER — SODIUM CHLORIDE 0.9% IV SOLUTION
250.0000 mL | INTRAVENOUS | Status: DC
  Administered 2023-10-16: 100 mL via INTRAVENOUS

## 2023-10-16 MED ORDER — DIPHENHYDRAMINE HCL 25 MG PO CAPS
50.0000 mg | ORAL_CAPSULE | Freq: Once | ORAL | Status: AC
  Administered 2023-10-16: 50 mg via ORAL
  Filled 2023-10-16: qty 2

## 2023-10-16 MED ORDER — METHYLPREDNISOLONE SODIUM SUCC 125 MG IJ SOLR
125.0000 mg | Freq: Once | INTRAMUSCULAR | Status: AC
  Administered 2023-10-16: 125 mg via INTRAVENOUS
  Filled 2023-10-16: qty 2

## 2023-10-16 NOTE — Progress Notes (Signed)
 Pt. Here for 1 unit of blood. States she wants antibiotics for her leg wound. She went to the wound clinic on Tuesday 10/14/23 and completed Doxycycline  10/15/23, but her leg has a foul odor and she thinks ii is infected.  Vital signs stable and afebrile.  Polly Brink, [place] notified and in for observation/assessment.

## 2023-10-16 NOTE — Progress Notes (Signed)
 Symptom Management Consult Note [facility]    Patient Care Team: Claire Crick, MD as PCP - General (Family Medicine) Darlis Eisenmenger, MD as PCP - Advanced Heart Failure (Cardiology) Devorah Fonder, MD as PCP - Cardiology (Cardiology) Verona Goodwill, MD as PCP - Electrophysiology (Cardiology) Devorah Fonder, MD as Consulting Physician (Cardiology) Verona Goodwill, MD as Consulting Physician (Cardiology) [name], Ninette Basque, MD as Consulting Physician (Vascular Surgery) Avonne Boettcher, MD as Consulting Physician (Hematology and Oncology) Sonja Tolleson, MD as Consulting Physician (Oncology) Jonathan Neighbor, [facility] (Inactive) as Pharmacist (Pharmacist)    Name / MRN / DOB: [name]  [id number]  19-Nov-1933   Date of visit: 10/16/2023   Chief Complaint/Reason for visit: leg wound    ASSESSMENT AND PLAN Patient is a 89 y.o. female with oncologic history of anemia and neuroendocrine tumor followed by Dr. Maryalice Smaller.  I have viewed most recent oncology note and lab work.  #Neuroendocrine tumor and anemia - Here for scheduled blood transfusion - Next appointment with oncologist is 11/04/23   #Leg wound - Patient followed by wound care clinic and had recent visit on 10/14/23. - Patient is non toxic appearing, afebrile. Wound does not appear infected. Patient and family are very concerned about infection because wound has developed an odor. Patent finished course of doxycyline yesterday. Prior to that was on ciprofloxacin  in early May. I discussed my concern of frequent antibiotic use and the potential negative effects that can cause including antibiotic resistance and C diff. With her history of sepsis they feel strongly that an antibiotic is needed. Family is agreeable to a watch and wait approach. A prescription for Keflex  has been sent to her pharmacy to take if needed. I strongly encouraged patient to wait at least a week before starting antibiotic as there  is a strong possibility she will not need it. Advised patient to follow up with wound care clinic sooner than her 6/23 appointment if needed. Patient is starting a new ointment today from wound clinic. Prior to arrival today she applied A&D ointment and xeroform gauze to the wound.    Strict ED precautions discussed should symptoms worsen.   HEME/ONC HISTORY Oncology History   No history exists.      INTERVAL HISTORY  Discussed the use of AI scribe software for clinical note transcription with the patient, who gave verbal consent to proceed.    [name] is a 89 y.o. female with pertinent history of anemia and neuroendocrine tumor to [facility] today with chief complaint of left leg wound. Patient is accompanied by family member who provides additional history.  Patient and family member are concerned that the wound on her left lower leg is infected.  They say it developed a strong odor yesterday.  Patient did just finish a course of doxycycline  yesterday.  While on the antibiotic there was no odor appreciated.  Patient has had multiple hospital admissions because of sepsis and is worried that she might develop it at this time.  She currently denies any fever or chills.  Patient was seen at the wound clinic x 2 days ago and got a good report overall.       ROS  All other systems are reviewed and are negative for acute change except as noted in the HPI.    Allergies  Allergen Reactions   Codeine Nausea And Vomiting and Other (See Comments)    Hallucinations, too- CANNOT HAVE; thinks she may  have received Narcan  to reverse the effect   Dilaudid  [Hydromorphone ] Other (See Comments)    Excessive Somnolence- Required Narcan  X2   Tape Other (See Comments)    CAN TOLERATE ONLY EASY-RELEASE, PAPER TAPE, AS THE SKIN IS VERY THIN AND WILL BRUISE AND TEAR VERY EASILY   Amoxil  [Amoxicillin ] Nausea Only   Asa [Aspirin ] Other (See Comments)    Told to avoid due to reduced kidney  function, has 1 remaining kidney   Ms Contin  [Morphine ] Nausea And Vomiting and Other (See Comments)    Hallucinations, also   Neurontin  [Gabapentin ] Other (See Comments)    Out of it   Nsaids Other (See Comments)    Told to avoid due to reduced kidney function, has 1 remaining kidney     Past Medical History:  Diagnosis Date   (HFpEF) heart failure with preserved ejection fraction (HCC)    EF=60-60%   Actinic keratosis 01/17/2015   R forearm   Adjustment disorder with anxiety    Adverse effect of other narcotics, sequela    Intolerance to all narcotics   Anti-Duffy antibodies present    Aortic valve stenosis    Arthritis    some in my hands (11/11/2012)   Atrial fibrillation, permanent (HCC)    Eliquis    Atypical mole 03/25/2018   L forearm - severe   Automatic implantable cardioverter-defibrillator in situ    Avascular necrosis of hip (HCC) 05/03/2011   Carotid artery stenosis 09/2007   a. 09/2007: 60-79% bilateral (stable); b. 10/2008: 40-59% R 60-79%    Cellulitis of left lower extremity 07/13/2020   CKD (chronic kidney disease), stage III (HCC)    COPD (chronic obstructive pulmonary disease) (HCC)    Coronary artery disease    non-obstructive by 2006 cath   COVID-19 virus infection 02/12/2022   Displaced fracture of left femoral neck (HCC) 10/09/2018   GI bleed 03/28/2020   AVM   High cholesterol    Hypertension 05/20/2011   Hypertr obst cardiomyop    Hypotension, unspecified    cardiac cath 2006..nonobstructive CAD 30-40s lesions.Aaron AasETT 1/09 nondiagnostic due to poor HR response..Right Renal Cancer 2003   Iron  deficiency anemia    Long term (current) use of anticoagulants    On home oxygen  therapy    3L [place]   Osteoarthritis of right hip    PAD (peripheral artery disease) (HCC)    Pancreatic cancer (HCC)    sandostatin    PONV (postoperative nausea and vomiting)    Presence of permanent cardiac pacemaker    Pulmonary HTN (HCC)    RECTAL BLEEDING 10/13/2009    Qualifier: Diagnosis of  By: Daphane Dynes NP, Paula     Red blood cell antibody positive with compatible PRBC difficult to obtain    Anti FYA (Duffy a) antibody. Must be transfused with PRB which are Duffy Antigen Negative and Crossmatch Compatible   Renal cell carcinoma (HCC) 2005   s/p right nephrectomy   Squamous cell carcinoma of skin 01/17/2015   R lat wrist   Squamous cell carcinoma of skin 01/29/2018   R post upper leg - superficially invasive   Squamous cell carcinoma of skin 03/25/2018   L lat foot   Squamous cell carcinoma of skin 11/09/2020   left lat foot - EDC 01/01/21, recurrent 01/31/21 - MOHs 02/22/21   Squamous cell carcinoma of skin 12/19/2021   Right Posterior Medial Thigh, EDC   Squamous cell carcinoma of skin 12/19/2021   SCC IS, L lat heel, EDC 02/04/2022   Squamous  cell carcinoma of skin 01/21/2022   L forearm, EDC 02/04/2022   Squamous cell carcinoma of skin 01/21/2022   R lower leg below knee, EDC 02/04/2022   Squamous cell carcinoma of skin 01/21/2022   SCCIS, R post heel, EDC 02/04/2022   Squamous cell carcinoma of skin 07/01/2022   left lower abdomen, in situ, EDC   Squamous cell carcinoma of skin 07/01/2022   left medial chest, EDC   Urge incontinence    Venous stasis of both lower extremities      Past Surgical History:  Procedure Laterality Date   ABDOMINAL AORTOGRAM W/LOWER EXTREMITY N/A 02/12/2021   Procedure: ABDOMINAL AORTOGRAM W/LOWER EXTREMITY;  Surgeon: Adine Hoof, MD;  Location: [facility] INVASIVE CV LAB;  Service: Cardiovascular;  Laterality: N/A;   ABDOMINAL HYSTERECTOMY  1975   for benign causes   APPENDECTOMY     BI-VENTRICULAR PACEMAKER UPGRADE  05/04/2010   BIOPSY  02/28/2019   Procedure: BIOPSY;  Surgeon: Janel Medford, MD;  Location: [facility] ENDOSCOPY;  Service: Endoscopy;;   BIOPSY  03/04/2022   Procedure: BIOPSY;  Surgeon: Normie Becton., MD;  Location: [facility] ENDOSCOPY;  Service: Gastroenterology;;   CARDIAC  CATHETERIZATION  2006   CARDIOVERSION N/A 02/20/2018   Procedure: CARDIOVERSION;  Surgeon: Devorah Fonder, MD;  Location: [facility] ORS;  Service: Cardiovascular;  Laterality: N/A;   CARDIOVERSION N/A 03/27/2018   Procedure: CARDIOVERSION;  Surgeon: Devorah Fonder, MD;  Location: [facility] ORS;  Service: Cardiovascular;  Laterality: N/A;   CATARACT EXTRACTION W/ INTRAOCULAR LENS  IMPLANT, BILATERAL  01/2006-02-2006   CHOLECYSTECTOMY N/A 11/11/2012   Procedure: LAPAROSCOPIC CHOLECYSTECTOMY WITH INTRAOPERATIVE CHOLANGIOGRAM;  Surgeon: Kari Otto. Eli Grizzle, MD;  Location: [facility] OR;  Service: General;  Laterality: N/A;   COLONOSCOPY WITH PROPOFOL  N/A 02/28/2019   Procedure: COLONOSCOPY WITH PROPOFOL ;  Surgeon: Janel Medford, MD;  Location: [facility] ENDOSCOPY;  Service: Endoscopy;  Laterality: N/A;   ENTEROSCOPY N/A 03/30/2020   Procedure: ENTEROSCOPY;  Surgeon: Annis Kinder, DO;  Location: [facility] ENDOSCOPY;  Service: Gastroenterology;  Laterality: N/A;   ENTEROSCOPY N/A 02/10/2021   Procedure: ENTEROSCOPY;  Surgeon: Alvis Jourdain, MD;  Location: [facility] ENDOSCOPY;  Service: Endoscopy;  Laterality: N/A;   ENTEROSCOPY N/A 02/15/2022   Procedure: ENTEROSCOPY;  Surgeon: Daina Drum, MD;  Location: [facility] ENDOSCOPY;  Service: Gastroenterology;  Laterality: N/A;   ENTEROSCOPY N/A 11/06/2022   Procedure: ENTEROSCOPY;  Surgeon: Brice Campi Albino Alu., MD;  Location: [facility] ENDOSCOPY;  Service: Gastroenterology;  Laterality: N/A;   EP IMPLANTABLE DEVICE N/A 02/21/2016   Procedure: ICD Generator Changeout;  Surgeon: Verona Goodwill, MD;  Location: [facility] INVASIVE CV LAB;  Service: Cardiovascular;  Laterality: N/A;   ESOPHAGOGASTRODUODENOSCOPY N/A 03/04/2022   Procedure: ESOPHAGOGASTRODUODENOSCOPY (EGD);  Surgeon: Normie Becton., MD;  Location: [facility] ENDOSCOPY;  Service: Gastroenterology;  Laterality: N/A;   ESOPHAGOGASTRODUODENOSCOPY (EGD) WITH PROPOFOL  N/A 02/28/2019   Procedure: ESOPHAGOGASTRODUODENOSCOPY (EGD) WITH PROPOFOL ;   Surgeon: Janel Medford, MD;  Location: [facility] ENDOSCOPY;  Service: Endoscopy;  Laterality: N/A;   EUS N/A 03/04/2022   Procedure: UPPER ENDOSCOPIC ULTRASOUND (EUS) RADIAL;  Surgeon: Normie Becton., MD;  Location: [facility] ENDOSCOPY;  Service: Gastroenterology;  Laterality: N/A;   FINE NEEDLE ASPIRATION N/A 03/04/2022   Procedure: FINE NEEDLE ASPIRATION (FNA) LINEAR;  Surgeon: Normie Becton., MD;  Location: [facility] ENDOSCOPY;  Service: Gastroenterology;  Laterality: N/A;   GIVENS CAPSULE STUDY N/A 03/15/2019   Procedure: GIVENS CAPSULE STUDY;  Surgeon: Lindle Rhea, MD;  Location: [facility] ENDOSCOPY;  Service: Gastroenterology;  Laterality: N/A;   HOT HEMOSTASIS N/A 03/30/2020   Procedure: HOT HEMOSTASIS (ARGON PLASMA COAGULATION/BICAP);  Surgeon: Annis Kinder, DO;  Location: [facility] ENDOSCOPY;  Service: Gastroenterology;  Laterality: N/A;   HOT HEMOSTASIS N/A 02/10/2021   Procedure: HOT HEMOSTASIS (ARGON PLASMA COAGULATION/BICAP);  Surgeon: Alvis Jourdain, MD;  Location: [facility] ENDOSCOPY;  Service: Endoscopy;  Laterality: N/A;   HOT HEMOSTASIS N/A 02/15/2022   Procedure: HOT HEMOSTASIS (ARGON PLASMA COAGULATION/BICAP);  Surgeon: Daina Drum, MD;  Location: [facility] ENDOSCOPY;  Service: Gastroenterology;  Laterality: N/A;   HOT HEMOSTASIS N/A 11/06/2022   Procedure: HOT HEMOSTASIS (ARGON PLASMA COAGULATION/BICAP);  Surgeon: Normie Becton., MD;  Location: [facility] ENDOSCOPY;  Service: Gastroenterology;  Laterality: N/A;   INSERT / REPLACE / REMOVE PACEMAKER  05-01-11   02-28-05-/05-04-10-ICD-MEDTRONIC MAXIMAL DR   IR ANGIOGRAM SELECTIVE EACH ADDITIONAL VESSEL  02/18/2022   IR ANGIOGRAM SELECTIVE EACH ADDITIONAL VESSEL  02/18/2022   IR ANGIOGRAM VISCERAL SELECTIVE  02/16/2022   IR ANGIOGRAM VISCERAL SELECTIVE  02/16/2022   IR EMBO ART  VEN HEMORR LYMPH EXTRAV  INC GUIDE ROADMAPPING  02/16/2022   IR THORACENTESIS ASP PLEURAL SPACE W/IMG GUIDE  02/21/2022   IR THORACENTESIS ASP PLEURAL SPACE W/IMG  GUIDE  02/22/2022   IR US  GUIDE VASC ACCESS RIGHT  02/16/2022   JOINT REPLACEMENT     LAPAROSCOPIC CHOLECYSTECTOMY  11/11/2012   LAPAROSCOPIC LYSIS OF ADHESIONS N/A 11/11/2012   Procedure: LAPAROSCOPIC LYSIS OF ADHESIONS;  Surgeon: Kari Otto. Eli Grizzle, MD;  Location: [facility] OR;  Service: General;  Laterality: N/A;   NEPHRECTOMY Right 06/2001    S/P RENAL CELL CANCER   PERIPHERAL VASCULAR INTERVENTION Bilateral 02/12/2021   Procedure: PERIPHERAL VASCULAR INTERVENTION;  Surgeon: Adine Hoof, MD;  Location: [facility] INVASIVE CV LAB;  Service: Cardiovascular;  Laterality: Bilateral;  Iliac artery stents   PRESSURE SENSOR/CARDIOMEMS N/A 02/03/2019   Procedure: PRESSURE SENSOR/CARDIOMEMS;  Surgeon: Darlis Eisenmenger, MD;  Location: [facility] INVASIVE CV LAB;  Service: Cardiovascular;  Laterality: N/A;   RIGHT HEART CATH N/A 11/09/2018   Procedure: RIGHT HEART CATH;  Surgeon: Darlis Eisenmenger, MD;  Location: [facility] INVASIVE CV LAB;  Service: Cardiovascular;  Laterality: N/A;   RIGHT HEART CATH N/A 03/08/2019   Procedure: RIGHT HEART CATH;  Surgeon: Darlis Eisenmenger, MD;  Location: [facility] INVASIVE CV LAB;  Service: Cardiovascular;  Laterality: N/A;   SUBMUCOSAL TATTOO INJECTION  02/28/2019   Procedure: SUBMUCOSAL TATTOO INJECTION;  Surgeon: Janel Medford, MD;  Location: [facility] ENDOSCOPY;  Service: Endoscopy;;   SUBMUCOSAL TATTOO INJECTION  11/06/2022   Procedure: SUBMUCOSAL TATTOO INJECTION;  Surgeon: Normie Becton., MD;  Location: [facility] ENDOSCOPY;  Service: Gastroenterology;;   TOTAL HIP ARTHROPLASTY Right 05/03/2011   Procedure: TOTAL HIP ARTHROPLASTY ANTERIOR APPROACH;  Surgeon: Arnie Lao;  Location: [facility] ORS;  Service: Orthopedics;  Laterality: Right;  Removal of Cannulated Screws Right Hip, Right Direct Anterior Hip Replacement   TOTAL HIP ARTHROPLASTY Left 10/09/2018   Procedure: TOTAL HIP ARTHROPLASTY ANTERIOR APPROACH;  Surgeon: Adonica Hoose, MD;  Location: [facility] OR;  Service: Orthopedics;  Laterality:  Left;    Social History   Socioeconomic History   Marital status: Widowed    Spouse name: Not on file   Number of children: 2   Years of education: high school   Highest education level: Not on file  Occupational History   Occupation: Retired    Associate Professor: RETIRED  Tobacco Use   Smoking status: Former    Current packs/day: 0.00    Average  packs/day: 0.5 packs/day for 40.0 years (20.0 ttl pk-yrs)    Types: Cigarettes    Start date: 05/06/1961    Quit date: 05/06/2001    Years since quitting: 22.4   Smokeless tobacco: Never  Vaping Use   Vaping status: Never Used  Substance and Sexual Activity   Alcohol use: No    Alcohol/week: 0.0 standard drinks of alcohol   Drug use: No   Sexual activity: Not Currently  Other Topics Concern   Not on file  Social History Narrative   Would desire CPR   07/11/20   From: the area   Living: with great-grandson - Herminia Lope (2003)   Work: retired - clerical      Family: 2 children - Amalia Badder and [place] - 2 grand chlidren - 5 great grand children      Enjoys: spending time with friends - watch move, eat out, spend time      Exercise: walking around the house   Diet: not good, limits red meat, limits salt, low appetite      Safety   Seat belts: Yes    Guns: Yes  and secure   Safe in relationships: Yes    Social Drivers of Health   Financial Resource Strain: Low Risk  (05/28/2022)   Overall Financial Resource Strain (CARDIA)    Difficulty of Paying Living Expenses: Not hard at all  Food Insecurity: No Food Insecurity (09/16/2023)   Hunger Vital Sign    Worried About Running Out of Food in the Last Year: Never true    Ran Out of Food in the Last Year: Never true  Transportation Needs: No Transportation Needs (09/16/2023)   PRAPARE - Administrator, Civil Service (Medical): No    Lack of Transportation (Non-Medical): No  Physical Activity: Inactive (09/24/2021)   Exercise Vital Sign    Days of Exercise per Week: 0 days    Minutes of  Exercise per Session: 0 min  Stress: No Stress Concern Present (09/24/2021)   [employer] of Occupational Health - Occupational Stress Questionnaire    Feeling of Stress : Not at all  Social Connections: Socially Isolated (09/16/2023)   Social Connection and Isolation Panel    Frequency of Communication with Friends and Family: More than three times a week    Frequency of Social Gatherings with Friends and Family: More than three times a week    Attends Religious Services: Never    Database administrator or Organizations: No    Attends Banker Meetings: Never    Marital Status: Widowed  Intimate Partner Violence: Not At Risk (09/16/2023)   Humiliation, Afraid, Rape, and Kick questionnaire    Fear of Current or Ex-Partner: No    Emotionally Abused: No    Physically Abused: No    Sexually Abused: No    Family History  Problem Relation Age of Onset   Heart failure Mother    Early death Father        car accident   Breast cancer Maternal Aunt 22   Breast cancer Cousin    Breast cancer Other    Colon cancer Neg Hx    Esophageal cancer Neg Hx    Liver disease Neg Hx    Pancreatic cancer Neg Hx    Rectal cancer Neg Hx      Current Outpatient Medications:    [START ON 10/20/2023] cephALEXin  (KEFLEX ) 500 MG capsule, Take 1 capsule (500 mg total) by mouth 2 (two) times daily  for 7 days., Disp: 14 capsule, Rfl: 0   acetaminophen  (TYLENOL ) 500 MG tablet, Take 1,000 mg by mouth every 8 (eight) hours., Disp: , Rfl:    Ascorbic Acid  (VITAMIN C  PO), Take 1 tablet by mouth daily., Disp: , Rfl:    Cholecalciferol  (VITAMIN D -3 PO), Take 2 capsules by mouth daily., Disp: , Rfl:    clotrimazole -betamethasone  (LOTRISONE ) cream, Apply topically 2 (two) times daily., Disp: 30 g, Rfl: 0   CRANBERRY PO, Take 1 tablet by mouth daily., Disp: , Rfl:    Cyanocobalamin  (B-12 PO), Take 1 tablet by mouth daily., Disp: , Rfl:    doxycycline  (VIBRAMYCIN ) 100 MG capsule, Take 1 capsule (100 mg  total) by mouth 2 (two) times daily for 14 days., Disp: 28 capsule, Rfl: 0   ferrous gluconate  (FERGON) 324 MG tablet, Take 1 tablet (324 mg total) by mouth daily with breakfast., Disp: 30 tablet, Rfl: 3   fluticasone  (FLONASE ) 50 MCG/ACT nasal spray, Place 2 sprays into both nostrils daily as needed for allergies or rhinitis., Disp: 16 g, Rfl: 0   methocarbamol  (ROBAXIN ) 500 MG tablet, Take 1 tablet (500 mg total) by mouth every 8 (eight) hours as needed for muscle spasms., Disp: 30 tablet, Rfl: 0   metolazone  (ZAROXOLYN ) 2.5 MG tablet, Take 1 tablet (2.5 mg total) by mouth 3 (three) times a week. Every Tues., Thurs and Sat. with 40 meq of potassium, Disp: 90 tablet, Rfl: 3   Multiple Vitamins-Minerals (HAIR SKIN NAILS PO), Take 1 capsule by mouth daily., Disp: , Rfl:    pantoprazole  (PROTONIX ) 40 MG tablet, Take 1 tablet (40 mg total) by mouth daily., Disp: 30 tablet, Rfl: 3   Potassium Chloride  ER 20 MEQ TBCR, Take 1 tablet (20 mEq total) by mouth daily., Disp: 30 tablet, Rfl: 2   rosuvastatin  (CRESTOR ) 10 MG tablet, Take 1 tablet (10 mg total) by mouth daily., Disp: 30 tablet, Rfl: 0   torsemide  (DEMADEX ) 20 MG tablet, Take 5 tablets (100 mg total) by mouth 2 (two) times daily., Disp: 250 tablet, Rfl: 3   traMADol  (ULTRAM ) 50 MG tablet, Take 1 tablet (50 mg total) by mouth 2 (two) times daily as needed., Disp: 30 tablet, Rfl: 0 No current facility-administered medications for this visit.  Facility-Administered Medications Ordered in Other Visits:    0.9 %  sodium chloride  infusion (Manually program via Guardrails IV Fluids), 250 mL, Intravenous, Continuous, Sonja Jette, MD, Stopped at 10/16/23 1403  PHYSICAL EXAM ECOG FS:2 - Symptomatic, <50% confined to bed    Vitals:   10/16/23 1145  BP: (!) 152/78  Pulse: 60  Resp: 18  Temp: 97.7 F (36.5 C)  TempSrc: Oral  SpO2: 100%   Physical Exam Vitals and nursing note reviewed.  Constitutional:      Appearance: She is not ill-appearing or  toxic-appearing.  HENT:     Head: Normocephalic.   Eyes:     Conjunctiva/sclera: Conjunctivae normal.    Cardiovascular:     Rate and Rhythm: Normal rate and regular rhythm.     Pulses: Normal pulses.     Heart sounds: Normal heart sounds.  Pulmonary:     Effort: Pulmonary effort is normal.     Breath sounds: Normal breath sounds.  Abdominal:     General: There is no distension.   Musculoskeletal:     Cervical back: Normal range of motion.   Skin:    General: Skin is warm and dry.     Findings: Wound present.  Comments: Please see media below   Neurological:     Mental Status: She is alert.        LABORATORY DATA I have reviewed the data as listed    Latest Ref Rng & Units 10/14/2023    3:48 PM 10/08/2023    3:09 PM 10/01/2023    3:05 PM  CBC  WBC 4.0 - 10.5 K/uL 3.3  3.6  5.3   Hemoglobin 12.0 - 15.0 g/dL 7.7  8.2  8.8   Hematocrit 36.0 - 46.0 % 26.4  27.6  30.0   Platelets 150 - 400 K/uL 157  197  203         Latest Ref Rng & Units 10/01/2023    3:05 PM 09/17/2023    6:03 AM 09/16/2023    2:53 PM  CMP  Glucose 70 - 99 mg/dL 94  161  096   BUN 8 - 23 mg/dL 54  73  72   Creatinine 0.44 - 1.00 mg/dL 0.45  4.09  8.11   Sodium 135 - 145 mmol/L 138  136  134   Potassium 3.5 - 5.1 mmol/L 3.5  4.1  4.5   Chloride 98 - 111 mmol/L 95  93  89   CO2 22 - 32 mmol/L 33  32  37   Calcium  8.9 - 10.3 mg/dL 9.6  9.0  9.4   Total Protein 6.5 - 8.1 g/dL 8.1   7.8   Total Bilirubin 0.0 - 1.2 mg/dL 0.6   1.1   Alkaline Phos 38 - 126 U/L 52   61   AST 15 - 41 U/L 15   20   ALT 0 - 44 U/L 6   12        RADIOGRAPHIC STUDIES (from last 24 hours if applicable) I have personally reviewed the radiological images as listed and agreed with the findings in the report. No results found.      Visit Diagnosis: 1. Anemia, unspecified type   2. Chronic wound   3. Neuroendocrine tumor of pancreas      No orders of the defined types were placed in this encounter.   All  questions were answered. The patient knows to call the clinic with any problems, questions or concerns. No barriers to learning was detected.  A total of more than 30 minutes were spent on this encounter with face-to-face time and non-face-to-face time, including preparing to see the patient, ordering tests and/or medications, counseling the patient and coordination of care as outlined above.    Thank you for allowing me to participate in the care of this patient.    Azarian Starace E  Walisiewicz, PA-C Department of Hematology/Oncology [facility] Phone: [phone]  [phone]    10/16/2023 4:41 PM

## 2023-10-16 NOTE — Patient Instructions (Signed)
 Blood Transfusion, Adult, Care After After a blood transfusion, it is common to have: Bruising and soreness at the IV site. A headache. Follow these instructions at home: Your doctor may give you more instructions. If you have problems, contact your doctor. Insertion site care     Follow instructions from your doctor about how to take care of your insertion site. This is where an IV tube was put into your vein. Make sure you: Wash your hands with soap and water for at least 20 seconds before and after you change your bandage. If you cannot use soap and water, use hand sanitizer. Change your bandage as told by your doctor. Check your insertion site every day for signs of infection. Check for: Redness, swelling, or pain. Bleeding from the site. Warmth. Pus or a bad smell. General instructions Take over-the-counter and prescription medicines only as told by your doctor. Rest as told by your doctor. Go back to your normal activities as told by your doctor. Keep all follow-up visits. You may need to have tests at certain times to check your blood. Contact a doctor if: You have itching or red, swollen areas of skin (hives). You have a fever or chills. You have pain in the head, back, or chest. You feel worried or nervous (anxious). You feel weak after doing your normal activities. You have any of these problems at the insertion site: Redness, swelling, warmth, or pain. Bleeding that does not stop with pressure. Pus or a bad smell. If you received your blood transfusion in an outpatient setting, you will be told whom to contact to report any reactions. Get help right away if: You have signs of a serious reaction. This may be coming from an allergy or the body's defense system (immune system). Signs include: Trouble breathing or shortness of breath. Swelling of the face or feeling warm (flushed). A widespread rash. Dark pee (urine) or blood in the pee. Fast heartbeat. These symptoms  may be an emergency. Get help right away. Call 911. Do not wait to see if the symptoms will go away. Do not drive yourself to the hospital. Summary Bruising and soreness at the IV site are common. Check your insertion site every day for signs of infection. Rest as told by your doctor. Go back to your normal activities as told by your doctor. Get help right away if you have signs of a serious reaction. This information is not intended to replace advice given to you by your health care provider. Make sure you discuss any questions you have with your health care provider. Document Revised: 07/20/2021 Document Reviewed: 07/20/2021 Elsevier Patient Education  2024 [employer]  Epoetin  Alfa Injection What is this medication? EPOETIN  ALFA (e POE e tin AL fa) treats low levels of red blood cells (anemia) caused by kidney disease, chemotherapy, or HIV medications. It can also be used in people who are at risk for blood loss during surgery. It works by Systems analyst make more red blood cells, which reduces the need for blood transfusions. This medicine may be used for other purposes; ask your health care provider or pharmacist if you have questions. COMMON BRAND NAME(S): Epogen , Procrit, Retacrit  What should I tell my care team before I take this medication? They need to know if you have any of these conditions: Blood clots Cancer Heart disease High blood pressure On dialysis Seizures Stroke An unusual or allergic reaction to epoetin  alfa, albumin , benzyl alcohol, other medications, foods, dyes, or preservatives Pregnant or  trying to get pregnant Breast-feeding How should I use this medication? This medication is injected into a vein or under the skin. It is usually given by your care team in a hospital or clinic setting. It may also be given at home. If you get this medication at home, you will be taught how to prepare and give it. Use exactly as directed. Take it as directed on the  prescription label at the same time every day. Keep taking it unless your care team tells you to stop. It is important that you put your used needles and syringes in a special sharps container. Do not put them in a trash can. If you do not have a sharps container, call your pharmacist or care team to get one. A special MedGuide will be given to you by the pharmacist with each prescription and refill. Be sure to read this information carefully each time. Talk to your care team about the use of this medication in children. While this medication may be used in children as young as 1 month of age for selected conditions, precautions do apply. Overdosage: If you think you have taken too much of this medicine contact a poison control center or emergency room at once. NOTE: This medicine is only for you. Do not share this medicine with others. What if I miss a dose? If you miss a dose, take it as soon as you can. If it is almost time for your next dose, take only that dose. Do not take double or extra doses. What may interact with this medication? Darbepoetin alfa Methoxy polyethylene glycol-epoetin  beta This list may not describe all possible interactions. Give your health care provider a list of all the medicines, herbs, non-prescription drugs, or dietary supplements you use. Also tell them if you smoke, drink alcohol, or use illegal drugs. Some items may interact with your medicine. What should I watch for while using this medication? Visit your care team for regular checks on your progress. Check your blood pressure as directed. Know what your blood pressure should be and when to contact your care team. Your condition will be monitored carefully while you are receiving this medication. You may need blood work while taking this medication. What side effects may I notice from receiving this medication? Side effects that you should report to your care team as soon as possible: Allergic reactions--skin  rash, itching, hives, swelling of the face, lips, tongue, or throat Blood clot--pain, swelling, or warmth in the leg, shortness of breath, chest pain Heart attack--pain or tightness in the chest, shoulders, arms, or jaw, nausea, shortness of breath, cold or clammy skin, feeling faint or lightheaded Increase in blood pressure Rash, fever, and swollen lymph nodes Redness, blistering, peeling, or loosening of the skin, including inside the mouth Seizures Stroke--sudden numbness or weakness of the face, arm, or leg, trouble speaking, confusion, trouble walking, loss of balance or coordination, dizziness, severe headache, change in vision Side effects that usually do not require medical attention (report to your care team if they continue or are bothersome): Bone, joint, or muscle pain Cough Headache Nausea Pain, redness, or irritation at injection site This list may not describe all possible side effects. Call your doctor for medical advice about side effects. You may report side effects to FDA at 1-800-FDA-1088. Where should I keep my medication? Keep out of the reach of children and pets. Store in a refrigerator. Do not freeze. Do not shake. Protect from light. Keep this medication in the  original container until you are ready to take it. See product for storage information. Get rid of any unused medication after the expiration date. To get rid of medications that are no longer needed or have expired: Take the medication to a medication take-back program. Check with your pharmacy or law enforcement to find a location. If you cannot return the medication, ask your pharmacist or care team how to get rid of the medication safely. NOTE: This sheet is a summary. It may not cover all possible information. If you have questions about this medicine, talk to your doctor, pharmacist, or health care provider.  2024 Elsevier/Gold Standard (2021-08-24 00:00:00)

## 2023-10-21 ENCOUNTER — Telehealth: Payer: Self-pay | Admitting: Hematology

## 2023-10-21 NOTE — Telephone Encounter (Signed)
 Canceled and rescheduled appointments per patients request via incoming call. Talked with the patient and she is aware of all changes made to her upcoming appointments.

## 2023-10-22 ENCOUNTER — Inpatient Hospital Stay

## 2023-10-24 ENCOUNTER — Inpatient Hospital Stay

## 2023-10-24 ENCOUNTER — Encounter: Payer: Self-pay | Admitting: Hematology

## 2023-10-24 VITALS — BP 149/78 | HR 70 | Resp 16

## 2023-10-24 DIAGNOSIS — N184 Chronic kidney disease, stage 4 (severe): Secondary | ICD-10-CM | POA: Diagnosis not present

## 2023-10-24 DIAGNOSIS — K5520 Angiodysplasia of colon without hemorrhage: Secondary | ICD-10-CM

## 2023-10-24 DIAGNOSIS — D649 Anemia, unspecified: Secondary | ICD-10-CM

## 2023-10-24 DIAGNOSIS — D500 Iron deficiency anemia secondary to blood loss (chronic): Secondary | ICD-10-CM

## 2023-10-24 DIAGNOSIS — D3A8 Other benign neuroendocrine tumors: Secondary | ICD-10-CM

## 2023-10-24 LAB — CBC WITH DIFFERENTIAL (CANCER CENTER ONLY)
Abs Immature Granulocytes: 0.01 10*3/uL (ref 0.00–0.07)
Basophils Absolute: 0 10*3/uL (ref 0.0–0.1)
Basophils Relative: 1 %
Eosinophils Absolute: 0 10*3/uL (ref 0.0–0.5)
Eosinophils Relative: 0 %
HCT: 29.6 % — ABNORMAL LOW (ref 36.0–46.0)
Hemoglobin: 8.8 g/dL — ABNORMAL LOW (ref 12.0–15.0)
Immature Granulocytes: 0 %
Lymphocytes Relative: 10 %
Lymphs Abs: 0.4 10*3/uL — ABNORMAL LOW (ref 0.7–4.0)
MCH: 23.6 pg — ABNORMAL LOW (ref 26.0–34.0)
MCHC: 29.7 g/dL — ABNORMAL LOW (ref 30.0–36.0)
MCV: 79.4 fL — ABNORMAL LOW (ref 80.0–100.0)
Monocytes Absolute: 0.4 10*3/uL (ref 0.1–1.0)
Monocytes Relative: 12 %
Neutro Abs: 2.7 10*3/uL (ref 1.7–7.7)
Neutrophils Relative %: 77 %
Platelet Count: 164 10*3/uL (ref 150–400)
RBC: 3.73 MIL/uL — ABNORMAL LOW (ref 3.87–5.11)
RDW: 20.7 % — ABNORMAL HIGH (ref 11.5–15.5)
WBC Count: 3.5 10*3/uL — ABNORMAL LOW (ref 4.0–10.5)
nRBC: 0 % (ref 0.0–0.2)

## 2023-10-24 LAB — SAMPLE TO BLOOD BANK

## 2023-10-24 LAB — TOTAL PROTEIN, URINE DIPSTICK: Protein, ur: NEGATIVE mg/dL

## 2023-10-24 MED ORDER — SODIUM CHLORIDE 0.9 % IV SOLN
5.0000 mg/kg | Freq: Once | INTRAVENOUS | Status: AC
  Administered 2023-10-24: 300 mg via INTRAVENOUS
  Filled 2023-10-24: qty 12

## 2023-10-24 MED ORDER — SODIUM CHLORIDE 0.9 % IV SOLN: Freq: Once | INTRAVENOUS | Status: AC

## 2023-10-24 NOTE — Patient Instructions (Signed)
 [facility] - A DEPT OF [facility]  Discharge Instructions: Thank you for choosing [facility] to provide your oncology and hematology care.   If you have a lab appointment with the Cancer Center, please go directly to the Cancer Center and check in at the registration area.   Wear comfortable clothing and clothing appropriate for easy access to any Portacath or PICC line.   We strive to give you quality time with your provider. You may need to reschedule your appointment if you arrive late (15 or more minutes).  Arriving late affects you and other patients whose appointments are after yours.  Also, if you miss three or more appointments without notifying the office, you may be dismissed from the clinic at the provider's discretion.      For prescription refill requests, have your pharmacy contact our office and allow 72 hours for refills to be completed.    Today you received the following chemotherapy and/or immunotherapy agents Bevacizumab      To help prevent nausea and vomiting after your treatment, we encourage you to take your nausea medication as directed.  BELOW ARE SYMPTOMS THAT SHOULD BE REPORTED IMMEDIATELY: *FEVER GREATER THAN 100.4 F (38 C) OR HIGHER *CHILLS OR SWEATING *NAUSEA AND VOMITING THAT IS NOT CONTROLLED WITH YOUR NAUSEA MEDICATION *UNUSUAL SHORTNESS OF BREATH *UNUSUAL BRUISING OR BLEEDING *URINARY PROBLEMS (pain or burning when urinating, or frequent urination) *BOWEL PROBLEMS (unusual diarrhea, constipation, pain near the anus) TENDERNESS IN MOUTH AND THROAT WITH OR WITHOUT PRESENCE OF ULCERS (sore throat, sores in mouth, or a toothache) UNUSUAL RASH, SWELLING OR PAIN  UNUSUAL VAGINAL DISCHARGE OR ITCHING   Items with * indicate a potential emergency and should be followed up as soon as possible or go to the Emergency Department if any problems should occur.  Please show the CHEMOTHERAPY ALERT CARD or IMMUNOTHERAPY  ALERT CARD at check-in to the Emergency Department and triage nurse.  Should you have questions after your visit or need to cancel or reschedule your appointment, please contact [facility] - A DEPT OF [facility]  Dept: [phone]  and follow the prompts.  Office hours are 8:00 a.m. to 4:30 p.m. Monday - Friday. Please note that voicemails left after 4:00 p.m. may not be returned until the following business day.  We are closed weekends and major holidays. You have access to a nurse at all times for urgent questions. Please call the main number to the clinic Dept: [phone] and follow the prompts.   For any non-urgent questions, you may also contact your provider using MyChart. We now offer e-Visits for anyone 26 and older to request care online for non-urgent symptoms. For details visit [email or web address].   Also download the MyChart app! Go to the app store, search "MyChart", open the app, select [facility], and log in with your MyChart username and password.  Bevacizumab Injection What is this medication? BEVACIZUMAB (be va SIZ yoo mab) treats some types of cancer. It works by blocking a protein that causes cancer cells to grow and multiply. This helps to slow or stop the spread of cancer cells. It is a monoclonal antibody. This medicine may be used for other purposes; ask your health care provider or pharmacist if you have questions. COMMON BRAND NAME(S): Alymsys, Avastin, MVASI, Rosaland Lao What should I tell my care team before I take this medication? They need to know if you  have any of these conditions: Blood clots Coughing up blood Having or recent surgery Heart failure High blood pressure History of a connection between 2 or more body parts that do not usually connect (fistula) History of a tear in your stomach or intestines Protein in your urine An unusual or allergic reaction to bevacizumab, other medications, foods, dyes, or  preservatives Pregnant or trying to get pregnant Breast-feeding How should I use this medication? This medication is injected into a vein. It is given by your care team in a hospital or clinic setting. Talk to your care team the use of this medication in children. Special care may be needed. Overdosage: If you think you have taken too much of this medicine contact a poison control center or emergency room at once. NOTE: This medicine is only for you. Do not share this medicine with others. What if I miss a dose? Keep appointments for follow-up doses. It is important not to miss your dose. Call your care team if you are unable to keep an appointment. What may interact with this medication? Interactions are not expected. This list may not describe all possible interactions. Give your health care provider a list of all the medicines, herbs, non-prescription drugs, or dietary supplements you use. Also tell them if you smoke, drink alcohol, or use illegal drugs. Some items may interact with your medicine. What should I watch for while using this medication? Your condition will be monitored carefully while you are receiving this medication. You may need blood work while taking this medication. This medication may make you feel generally unwell. This is not uncommon as chemotherapy can affect healthy cells as well as cancer cells. Report any side effects. Continue your course of treatment even though you feel ill unless your care team tells you to stop. This medication may increase your risk to bruise or bleed. Call your care team if you notice any unusual bleeding. Before having surgery, talk to your care team to make sure it is ok. This medication can increase the risk of poor healing of your surgical site or wound. You will need to stop this medication for 28 days before surgery. After surgery, wait at least 28 days before restarting this medication. Make sure the surgical site or wound is healed enough  before restarting this medication. Talk to your care team if questions. Talk to your care team if you may be pregnant. Serious birth defects can occur if you take this medication during pregnancy and for 6 months after the last dose. Contraception is recommended while taking this medication and for 6 months after the last dose. Your care team can help you find the option that works for you. Do not breastfeed while taking this medication and for 6 months after the last dose. This medication can cause infertility. Talk to your care team if you are concerned about your fertility. What side effects may I notice from receiving this medication? Side effects that you should report to your care team as soon as possible: Allergic reactions--skin rash, itching, hives, swelling of the face, lips, tongue, or throat Bleeding--bloody or black, tar-like stools, vomiting blood or brown material that looks like coffee grounds, red or dark brown urine, small red or purple spots on skin, unusual bruising or bleeding Blood clot--pain, swelling, or warmth in the leg, shortness of breath, chest pain Heart attack--pain or tightness in the chest, shoulders, arms, or jaw, nausea, shortness of breath, cold or clammy skin, feeling faint or lightheaded Heart failure--shortness  of breath, swelling of the ankles, feet, or hands, sudden weight gain, unusual weakness or fatigue Increase in blood pressure Infection--fever, chills, cough, sore throat, wounds that don't heal, pain or trouble when passing urine, general feeling of discomfort or being unwell Infusion reactions--chest pain, shortness of breath or trouble breathing, feeling faint or lightheaded Kidney injury--decrease in the amount of urine, swelling of the ankles, hands, or feet Stomach pain that is severe, does not go away, or gets worse Stroke--sudden numbness or weakness of the face, arm, or leg, trouble speaking, confusion, trouble walking, loss of balance or  coordination, dizziness, severe headache, change in vision Sudden and severe headache, confusion, change in vision, seizures, which may be signs of posterior reversible encephalopathy syndrome (PRES) Side effects that usually do not require medical attention (report to your care team if they continue or are bothersome): Back pain Change in taste Diarrhea Dry skin Increased tears Nosebleed This list may not describe all possible side effects. Call your doctor for medical advice about side effects. You may report side effects to FDA at 1-800-FDA-1088. Where should I keep my medication? This medication is given in a hospital or clinic. It will not be stored at home. NOTE: This sheet is a summary. It may not cover all possible information. If you have questions about this medicine, talk to your doctor, pharmacist, or health care provider.  2024 Elsevier/Gold Standard (2021-09-07 00:00:00)

## 2023-10-27 ENCOUNTER — Ambulatory Visit: Admitting: Physician Assistant

## 2023-10-27 DIAGNOSIS — J449 Chronic obstructive pulmonary disease, unspecified: Secondary | ICD-10-CM | POA: Diagnosis not present

## 2023-10-27 DIAGNOSIS — I5033 Acute on chronic diastolic (congestive) heart failure: Secondary | ICD-10-CM | POA: Diagnosis not present

## 2023-10-31 ENCOUNTER — Other Ambulatory Visit: Payer: Self-pay | Admitting: Hematology

## 2023-10-31 DIAGNOSIS — K5520 Angiodysplasia of colon without hemorrhage: Secondary | ICD-10-CM

## 2023-11-03 ENCOUNTER — Encounter: Admitting: Physician Assistant

## 2023-11-03 ENCOUNTER — Telehealth: Payer: Self-pay | Admitting: Hematology

## 2023-11-03 DIAGNOSIS — L97522 Non-pressure chronic ulcer of other part of left foot with fat layer exposed: Secondary | ICD-10-CM | POA: Diagnosis not present

## 2023-11-03 DIAGNOSIS — I890 Lymphedema, not elsewhere classified: Secondary | ICD-10-CM | POA: Diagnosis not present

## 2023-11-03 DIAGNOSIS — I87332 Chronic venous hypertension (idiopathic) with ulcer and inflammation of left lower extremity: Secondary | ICD-10-CM | POA: Diagnosis not present

## 2023-11-03 DIAGNOSIS — L97822 Non-pressure chronic ulcer of other part of left lower leg with fat layer exposed: Secondary | ICD-10-CM | POA: Diagnosis not present

## 2023-11-03 NOTE — Telephone Encounter (Signed)
 Rescheduled and canceled appointments per patients request via incoming call. Talked with the patient and she is aware of the changes made to her upcoming appointments.

## 2023-11-04 ENCOUNTER — Inpatient Hospital Stay

## 2023-11-04 ENCOUNTER — Inpatient Hospital Stay: Admitting: Hematology

## 2023-11-05 ENCOUNTER — Telehealth: Payer: Self-pay | Admitting: Hematology

## 2023-11-05 ENCOUNTER — Inpatient Hospital Stay

## 2023-11-05 NOTE — Telephone Encounter (Signed)
 Called to get the patient scheduled her 7/1 secure chat. The phone rang and got the message enter your remote access number. Will try calling patient back a little later.

## 2023-11-06 ENCOUNTER — Ambulatory Visit

## 2023-11-06 ENCOUNTER — Telehealth: Payer: Self-pay | Admitting: Hematology

## 2023-11-06 VITALS — BP 149/78 | Ht 66.0 in | Wt 129.0 lb

## 2023-11-06 DIAGNOSIS — Z2821 Immunization not carried out because of patient refusal: Secondary | ICD-10-CM | POA: Diagnosis not present

## 2023-11-06 DIAGNOSIS — Z5320 Procedure and treatment not carried out because of patient's decision for unspecified reasons: Secondary | ICD-10-CM | POA: Diagnosis not present

## 2023-11-06 DIAGNOSIS — Z0000 Encounter for general adult medical examination without abnormal findings: Secondary | ICD-10-CM | POA: Diagnosis not present

## 2023-11-06 NOTE — Patient Instructions (Signed)
 Ms. Slattery , Thank you for taking time out of your busy schedule to complete your Annual Wellness Visit with me. I enjoyed our conversation and look forward to speaking with you again next year. I, as well as your care team,  appreciate your ongoing commitment to your health goals. Please review the following plan we discussed and let me know if I can assist you in the future. Your Game plan/ To Do List    Referrals: If you haven't heard from the office you've been referred to, please reach out to them at the phone provided.  none Follow up Visits: Next Medicare AWV with our clinical staff: n/a   Have you seen your provider in the last 6 months (3 months if uncontrolled diabetes)? No Next Office Visit with your provider: 11/18/2023  Clinician Recommendations:  Aim for 30 minutes of exercise or brisk walking, 6-8 glasses of water, and 5 servings of fruits and vegetables each day.       This is a list of the screening recommended for you and due dates:  Health Maintenance  Topic Date Due   Zoster (Shingles) Vaccine (1 of 2) 03/11/1953   COVID-19 Vaccine (3 - Pfizer risk series) 08/03/2019   DTaP/Tdap/Td vaccine (3 - Td or Tdap) 08/16/2020   Mammogram  10/09/2021   Flu Shot  12/05/2023   Pneumococcal Vaccine for age over 46  Completed   DEXA scan (bone density measurement)  Completed   Hepatitis B Vaccine  Aged Out   HPV Vaccine  Aged Out   Meningitis B Vaccine  Aged Out    Advanced directives: (In Chart) A copy of your advanced directives are scanned into your chart should your provider ever need it. Advance Care Planning is important because it:  [x]  Makes sure you receive the medical care that is consistent with your values, goals, and preferences  [x]  It provides guidance to your family and loved ones and reduces their decisional burden about whether or not they are making the right decisions based on your wishes.  Follow the link provided in your after visit summary or read  over the paperwork we have mailed to you to help you started getting your Advance Directives in place. If you need assistance in completing these, please reach out to us  so that we can help you!  See attachments for Preventive Care and Fall Prevention Tips.

## 2023-11-06 NOTE — Telephone Encounter (Signed)
 Called to get the patient scheduled for possible blood next Tuesday per 7/1 secure chat. Called the patient but the phone rings and a message is given stating please enter your remote access code. Called the patients daughter; however, her VM has not been set up.

## 2023-11-06 NOTE — Progress Notes (Signed)
 Because this visit was a virtual/telehealth visit,  certain criteria was not obtained, such a blood pressure, CBG if applicable, and timed get up and go. Any medications not marked as taking were not mentioned during the medication reconciliation part of the visit. Any vitals not documented were not able to be obtained due to this being a telehealth visit or patient was unable to self-report a recent blood pressure reading due to a lack of equipment at home via telehealth. Vitals that have been documented are verbally provided by the patient.  This visit was performed by a medical professional under my direct supervision. I was immediately available for consultation/collaboration. I have reviewed and agree with the Annual Wellness Visit documentation.  Subjective:   [name] is a 89 y.o. who presents for a Medicare Wellness preventive visit.  As a reminder, Annual Wellness Visits don't include a physical exam, and some assessments may be limited, especially if this visit is performed virtually. We may recommend an in-person follow-up visit with your provider if needed.  Visit Complete: Virtual I connected with  [name] on 11/06/23 by a audio enabled telemedicine application and verified that I am speaking with the correct person using two identifiers.  Patient Location: Home  Provider Location: Home Office  I discussed the limitations of evaluation and management by telemedicine. The patient expressed understanding and agreed to proceed.  Vital Signs: Because this visit was a virtual/telehealth visit, some criteria may be missing or patient reported. Any vitals not documented were not able to be obtained and vitals that have been documented are patient reported.  VideoDeclined- This patient declined Librarian, academic. Therefore the visit was completed with audio only.  Persons Participating in Visit: Patient.  AWV Questionnaire: No: Patient  Medicare AWV questionnaire was not completed prior to this visit.  Cardiac Risk Factors include: advanced age (>39men, >86 women);hypertension;diabetes mellitus;Other (see comment), Risk factor comments: afib     Objective:    Today's Vitals   11/06/23 1521 11/06/23 1522  BP: (!) 149/78   Weight: 129 lb (58.5 kg)   Height: 5' 6 (1.676 m)   PainSc:  7    Body mass index is 20.82 kg/m.     11/06/2023    3:20 PM 09/16/2023    9:54 PM 09/16/2023    2:52 PM 09/03/2023    6:14 PM 09/03/2023    3:50 PM 06/24/2023    4:09 PM 06/13/2023    3:49 PM  Advanced Directives  Does Patient Have a Medical Advance Directive? No Yes Yes No No No No  Type of Advance Directive  Out of facility DNR (pink MOST or yellow form) Out of facility DNR (pink MOST or yellow form)      Does patient want to make changes to medical advance directive?  No - Patient declined       Would patient like information on creating a medical advance directive? No - Patient declined No - Patient declined  No - Patient declined No - Patient declined No - Patient declined No - Patient declined    Current Medications (verified) Outpatient Encounter Medications as of 11/06/2023  Medication Sig   acetaminophen  (TYLENOL ) 500 MG tablet Take 1,000 mg by mouth every 8 (eight) hours.   Ascorbic Acid  (VITAMIN C  PO) Take 1 tablet by mouth daily.   Cholecalciferol  (VITAMIN D -3 PO) Take 2 capsules by mouth daily.   clotrimazole -betamethasone  (LOTRISONE ) cream Apply topically 2 (two) times daily.   CRANBERRY PO  Take 1 tablet by mouth daily.   Cyanocobalamin  (B-12 PO) Take 1 tablet by mouth daily.   ferrous gluconate  (FERGON) 324 MG tablet Take 1 tablet (324 mg total) by mouth daily with breakfast.   fluticasone  (FLONASE ) 50 MCG/ACT nasal spray Place 2 sprays into both nostrils daily as needed for allergies or rhinitis.   methocarbamol  (ROBAXIN ) 500 MG tablet Take 1 tablet (500 mg total) by mouth every 8 (eight) hours as needed for muscle  spasms.   metolazone  (ZAROXOLYN ) 2.5 MG tablet Take 1 tablet (2.5 mg total) by mouth 3 (three) times a week. Every Tues., Thurs and Sat. with 40 meq of potassium   Multiple Vitamins-Minerals (HAIR SKIN NAILS PO) Take 1 capsule by mouth daily.   pantoprazole  (PROTONIX ) 40 MG tablet Take 1 tablet (40 mg total) by mouth daily.   Potassium Chloride  ER 20 MEQ TBCR Take 1 tablet (20 mEq total) by mouth daily.   rosuvastatin  (CRESTOR ) 10 MG tablet Take 1 tablet (10 mg total) by mouth daily.   torsemide  (DEMADEX ) 20 MG tablet Take 5 tablets (100 mg total) by mouth 2 (two) times daily.   traMADol  (ULTRAM ) 50 MG tablet Take 1 tablet (50 mg total) by mouth 2 (two) times daily as needed.   No facility-administered encounter medications on file as of 11/06/2023.    Allergies (verified) Codeine, Dilaudid  [hydromorphone ], Tape, Amoxil  [amoxicillin ], Asa [aspirin ], Ms contin  [morphine ], Neurontin  [gabapentin ], and Nsaids   History: Past Medical History:  Diagnosis Date   (HFpEF) heart failure with preserved ejection fraction (HCC)    EF=60-60%   Actinic keratosis 01/17/2015   R forearm   Adjustment disorder with anxiety    Adverse effect of other narcotics, sequela    Intolerance to all narcotics   Anti-Duffy antibodies present    Aortic valve stenosis    Arthritis    some in my hands (11/11/2012)   Atrial fibrillation, permanent (HCC)    Eliquis    Atypical mole 03/25/2018   L forearm - severe   Automatic implantable cardioverter-defibrillator in situ    Avascular necrosis of hip (HCC) 05/03/2011   Carotid artery stenosis 09/2007   a. 09/2007: 60-79% bilateral (stable); b. 10/2008: 40-59% R 60-79%    Cellulitis of left lower extremity 07/13/2020   CKD (chronic kidney disease), stage III (HCC)    COPD (chronic obstructive pulmonary disease) (HCC)    Coronary artery disease    non-obstructive by 2006 cath   COVID-19 virus infection 02/12/2022   Displaced fracture of left femoral neck (HCC)  10/09/2018   GI bleed 03/28/2020   AVM   High cholesterol    Hypertension 05/20/2011   Hypertr obst cardiomyop    Hypotension, unspecified    cardiac cath 2006..nonobstructive CAD 30-40s lesions.SABRAETT 1/09 nondiagnostic due to poor HR response..Right Renal Cancer 2003   Iron  deficiency anemia    Long term (current) use of anticoagulants    On home oxygen  therapy    3L [place]   Osteoarthritis of right hip    PAD (peripheral artery disease) (HCC)    Pancreatic cancer (HCC)    sandostatin    PONV (postoperative nausea and vomiting)    Presence of permanent cardiac pacemaker    Pulmonary HTN (HCC)    RECTAL BLEEDING 10/13/2009   Qualifier: Diagnosis of  By: Kerman NP, Paula     Red blood cell antibody positive with compatible PRBC difficult to obtain    Anti FYA (Duffy a) antibody. Must be transfused with PRB which are Duffy Antigen  Negative and Crossmatch Compatible   Renal cell carcinoma (HCC) 2005   s/p right nephrectomy   Squamous cell carcinoma of skin 01/17/2015   R lat wrist   Squamous cell carcinoma of skin 01/29/2018   R post upper leg - superficially invasive   Squamous cell carcinoma of skin 03/25/2018   L lat foot   Squamous cell carcinoma of skin 11/09/2020   left lat foot - EDC 01/01/21, recurrent 01/31/21 - MOHs 02/22/21   Squamous cell carcinoma of skin 12/19/2021   Right Posterior Medial Thigh, EDC   Squamous cell carcinoma of skin 12/19/2021   SCC IS, L lat heel, EDC 02/04/2022   Squamous cell carcinoma of skin 01/21/2022   L forearm, EDC 02/04/2022   Squamous cell carcinoma of skin 01/21/2022   R lower leg below knee, EDC 02/04/2022   Squamous cell carcinoma of skin 01/21/2022   SCCIS, R post heel, EDC 02/04/2022   Squamous cell carcinoma of skin 07/01/2022   left lower abdomen, in situ, EDC   Squamous cell carcinoma of skin 07/01/2022   left medial chest, EDC   Urge incontinence    Venous stasis of both lower extremities    Past Surgical History:   Procedure Laterality Date   ABDOMINAL AORTOGRAM W/LOWER EXTREMITY N/A 02/12/2021   Procedure: ABDOMINAL AORTOGRAM W/LOWER EXTREMITY;  Surgeon: Sheree Penne Bruckner, MD;  Location: [facility] INVASIVE CV LAB;  Service: Cardiovascular;  Laterality: N/A;   ABDOMINAL HYSTERECTOMY  1975   for benign causes   APPENDECTOMY     BI-VENTRICULAR PACEMAKER UPGRADE  05/04/2010   BIOPSY  02/28/2019   Procedure: BIOPSY;  Surgeon: Teressa Toribio SQUIBB, MD;  Location: [facility] ENDOSCOPY;  Service: Endoscopy;;   BIOPSY  03/04/2022   Procedure: BIOPSY;  Surgeon: Wilhelmenia Aloha Raddle., MD;  Location: [facility];  Service: Gastroenterology;;   CARDIAC CATHETERIZATION  2006   CARDIOVERSION N/A 02/20/2018   Procedure: CARDIOVERSION;  Surgeon: [name] Evalene PARAS, MD;  Location: [facility] ORS;  Service: Cardiovascular;  Laterality: N/A;   CARDIOVERSION N/A 03/27/2018   Procedure: CARDIOVERSION;  Surgeon: [name] Evalene PARAS, MD;  Location: [facility] ORS;  Service: Cardiovascular;  Laterality: N/A;   CATARACT EXTRACTION W/ INTRAOCULAR LENS  IMPLANT, BILATERAL  01/2006-02-2006   CHOLECYSTECTOMY N/A 11/11/2012   Procedure: LAPAROSCOPIC CHOLECYSTECTOMY WITH INTRAOPERATIVE CHOLANGIOGRAM;  Surgeon: Donnice POUR. Belinda, MD;  Location: [facility] OR;  Service: General;  Laterality: N/A;   COLONOSCOPY WITH PROPOFOL  N/A 02/28/2019   Procedure: COLONOSCOPY WITH PROPOFOL ;  Surgeon: Teressa Toribio SQUIBB, MD;  Location: [facility] ENDOSCOPY;  Service: Endoscopy;  Laterality: N/A;   ENTEROSCOPY N/A 03/30/2020   Procedure: ENTEROSCOPY;  Surgeon: San Sandor GAILS, DO;  Location: [facility] ENDOSCOPY;  Service: Gastroenterology;  Laterality: N/A;   ENTEROSCOPY N/A 02/10/2021   Procedure: ENTEROSCOPY;  Surgeon: Rollin Dover, MD;  Location: [facility] ENDOSCOPY;  Service: Endoscopy;  Laterality: N/A;   ENTEROSCOPY N/A 02/15/2022   Procedure: ENTEROSCOPY;  Surgeon: Federico Rosario BROCKS, MD;  Location: [facility] ENDOSCOPY;  Service: Gastroenterology;  Laterality: N/A;   ENTEROSCOPY N/A 11/06/2022    Procedure: ENTEROSCOPY;  Surgeon: Wilhelmenia Aloha Raddle., MD;  Location: [facility] ENDOSCOPY;  Service: Gastroenterology;  Laterality: N/A;   EP IMPLANTABLE DEVICE N/A 02/21/2016   Procedure: ICD Generator Changeout;  Surgeon: Elspeth [name] Sage, MD;  Location: [facility] INVASIVE CV LAB;  Service: Cardiovascular;  Laterality: N/A;   ESOPHAGOGASTRODUODENOSCOPY N/A 03/04/2022   Procedure: ESOPHAGOGASTRODUODENOSCOPY (EGD);  Surgeon: Wilhelmenia Aloha Raddle., MD;  Location: [facility];  Service: Gastroenterology;  Laterality: N/A;   ESOPHAGOGASTRODUODENOSCOPY (EGD)  WITH PROPOFOL  N/A 02/28/2019   Procedure: ESOPHAGOGASTRODUODENOSCOPY (EGD) WITH PROPOFOL ;  Surgeon: Teressa Toribio SQUIBB, MD;  Location: [facility] ENDOSCOPY;  Service: Endoscopy;  Laterality: N/A;   EUS N/A 03/04/2022   Procedure: UPPER ENDOSCOPIC ULTRASOUND (EUS) RADIAL;  Surgeon: Wilhelmenia Aloha Raddle., MD;  Location: [facility] ENDOSCOPY;  Service: Gastroenterology;  Laterality: N/A;   FINE NEEDLE ASPIRATION N/A 03/04/2022   Procedure: FINE NEEDLE ASPIRATION (FNA) LINEAR;  Surgeon: Wilhelmenia Aloha Raddle., MD;  Location: [facility] ENDOSCOPY;  Service: Gastroenterology;  Laterality: N/A;   GIVENS CAPSULE STUDY N/A 03/15/2019   Procedure: GIVENS CAPSULE STUDY;  Surgeon: Eda Iha, MD;  Location: [facility] ENDOSCOPY;  Service: Gastroenterology;  Laterality: N/A;   HOT HEMOSTASIS N/A 03/30/2020   Procedure: HOT HEMOSTASIS (ARGON PLASMA COAGULATION/BICAP);  Surgeon: San Sandor GAILS, DO;  Location: [facility] ENDOSCOPY;  Service: Gastroenterology;  Laterality: N/A;   HOT HEMOSTASIS N/A 02/10/2021   Procedure: HOT HEMOSTASIS (ARGON PLASMA COAGULATION/BICAP);  Surgeon: Rollin Dover, MD;  Location: [facility] ENDOSCOPY;  Service: Endoscopy;  Laterality: N/A;   HOT HEMOSTASIS N/A 02/15/2022   Procedure: HOT HEMOSTASIS (ARGON PLASMA COAGULATION/BICAP);  Surgeon: Federico Rosario BROCKS, MD;  Location: [facility] ENDOSCOPY;  Service: Gastroenterology;  Laterality: N/A;   HOT HEMOSTASIS N/A 11/06/2022   Procedure: HOT  HEMOSTASIS (ARGON PLASMA COAGULATION/BICAP);  Surgeon: Wilhelmenia Aloha Raddle., MD;  Location: [facility] ENDOSCOPY;  Service: Gastroenterology;  Laterality: N/A;   INSERT / REPLACE / REMOVE PACEMAKER  05-01-11   02-28-05-/05-04-10-ICD-MEDTRONIC MAXIMAL DR   IR ANGIOGRAM SELECTIVE EACH ADDITIONAL VESSEL  02/18/2022   IR ANGIOGRAM SELECTIVE EACH ADDITIONAL VESSEL  02/18/2022   IR ANGIOGRAM VISCERAL SELECTIVE  02/16/2022   IR ANGIOGRAM VISCERAL SELECTIVE  02/16/2022   IR EMBO ART  VEN HEMORR LYMPH EXTRAV  INC GUIDE ROADMAPPING  02/16/2022   IR THORACENTESIS ASP PLEURAL SPACE W/IMG GUIDE  02/21/2022   IR THORACENTESIS ASP PLEURAL SPACE W/IMG GUIDE  02/22/2022   IR US  GUIDE VASC ACCESS RIGHT  02/16/2022   JOINT REPLACEMENT     LAPAROSCOPIC CHOLECYSTECTOMY  11/11/2012   LAPAROSCOPIC LYSIS OF ADHESIONS N/A 11/11/2012   Procedure: LAPAROSCOPIC LYSIS OF ADHESIONS;  Surgeon: Donnice POUR. Belinda, MD;  Location: [facility] OR;  Service: General;  Laterality: N/A;   NEPHRECTOMY Right 06/2001    S/P RENAL CELL CANCER   PERIPHERAL VASCULAR INTERVENTION Bilateral 02/12/2021   Procedure: PERIPHERAL VASCULAR INTERVENTION;  Surgeon: Sheree Penne Bruckner, MD;  Location: [facility] INVASIVE CV LAB;  Service: Cardiovascular;  Laterality: Bilateral;  Iliac artery stents   PRESSURE SENSOR/CARDIOMEMS N/A 02/03/2019   Procedure: PRESSURE SENSOR/CARDIOMEMS;  Surgeon: [name] Ezra RAMAN, MD;  Location: [facility] INVASIVE CV LAB;  Service: Cardiovascular;  Laterality: N/A;   RIGHT HEART CATH N/A 11/09/2018   Procedure: RIGHT HEART CATH;  Surgeon: [name] Ezra RAMAN, MD;  Location: [facility] INVASIVE CV LAB;  Service: Cardiovascular;  Laterality: N/A;   RIGHT HEART CATH N/A 03/08/2019   Procedure: RIGHT HEART CATH;  Surgeon: [name] Ezra RAMAN, MD;  Location: [facility] INVASIVE CV LAB;  Service: Cardiovascular;  Laterality: N/A;   SUBMUCOSAL TATTOO INJECTION  02/28/2019   Procedure: SUBMUCOSAL TATTOO INJECTION;  Surgeon: Teressa Toribio SQUIBB, MD;  Location: [facility] ENDOSCOPY;   Service: Endoscopy;;   SUBMUCOSAL TATTOO INJECTION  11/06/2022   Procedure: SUBMUCOSAL TATTOO INJECTION;  Surgeon: Wilhelmenia Aloha Raddle., MD;  Location: [facility] ENDOSCOPY;  Service: Gastroenterology;;   TOTAL HIP ARTHROPLASTY Right 05/03/2011   Procedure: TOTAL HIP ARTHROPLASTY ANTERIOR APPROACH;  Surgeon: Bruckner CINDERELLA Poli;  Location: [facility] ORS;  Service: Orthopedics;  Laterality: Right;  Removal  of Cannulated Screws Right Hip, Right Direct Anterior Hip Replacement   TOTAL HIP ARTHROPLASTY Left 10/09/2018   Procedure: TOTAL HIP ARTHROPLASTY ANTERIOR APPROACH;  Surgeon: Fidel Rogue, MD;  Location: [facility] OR;  Service: Orthopedics;  Laterality: Left;   Family History  Problem Relation Age of Onset   Heart failure Mother    Early death Father        car accident   Breast cancer Maternal Aunt 61   Breast cancer Cousin    Breast cancer Other    Colon cancer Neg Hx    Esophageal cancer Neg Hx    Liver disease Neg Hx    Pancreatic cancer Neg Hx    Rectal cancer Neg Hx    Social History   Socioeconomic History   Marital status: Widowed    Spouse name: Not on file   Number of children: 2   Years of education: high school   Highest education level: Not on file  Occupational History   Occupation: Retired    Associate Professor: RETIRED  Tobacco Use   Smoking status: Former    Current packs/day: 0.00    Average packs/day: 0.5 packs/day for 40.0 years (20.0 ttl pk-yrs)    Types: Cigarettes    Start date: 05/06/1961    Quit date: 05/06/2001    Years since quitting: 22.5   Smokeless tobacco: Never  Vaping Use   Vaping status: Never Used  Substance and Sexual Activity   Alcohol use: No    Alcohol/week: 0.0 standard drinks of alcohol   Drug use: No   Sexual activity: Not Currently  Other Topics Concern   Not on file  Social History Narrative   Would desire CPR   07/11/20   From: the area   Living: with great-grandson - [name] (2003)   Work: retired - clerical      Family: 2 children - Devere and  Baxley - 2 grand chlidren - 5 great grand children      Enjoys: spending time with friends - watch move, eat out, spend time      Exercise: walking around the house   Diet: not good, limits red meat, limits salt, low appetite      Safety   Seat belts: Yes    Guns: Yes  and secure   Safe in relationships: Yes    Social Drivers of Corporate investment banker Strain: Low Risk  (11/06/2023)   Overall Financial Resource Strain (CARDIA)    Difficulty of Paying Living Expenses: Not hard at all  Food Insecurity: No Food Insecurity (11/06/2023)   Hunger Vital Sign    Worried About Running Out of Food in the Last Year: Never true    Ran Out of Food in the Last Year: Never true  Transportation Needs: No Transportation Needs (11/06/2023)   PRAPARE - Administrator, Civil Service (Medical): No    Lack of Transportation (Non-Medical): No  Physical Activity: Insufficiently Active (11/06/2023)   Exercise Vital Sign    Days of Exercise per Week: 3 days    Minutes of Exercise per Session: 20 min  Stress: No Stress Concern Present (11/06/2023)   [name] of Occupational Health - Occupational Stress Questionnaire    Feeling of Stress: Not at all  Social Connections: Socially Isolated (11/06/2023)   Social Connection and Isolation Panel    Frequency of Communication with Friends and Family: More than three times a week    Frequency of Social Gatherings with Friends and Family:  More than three times a week    Attends Religious Services: Never    Active Member of Clubs or Organizations: No    Attends Banker Meetings: Never    Marital Status: Widowed    Tobacco Counseling Counseling given: Not Answered    Clinical Intake:  Pre-visit preparation completed: Yes  Pain : 0-10 Pain Score: 7  Pain Type: Chronic pain Pain Location: Foot Pain Orientation: Right Pain Onset: Today Pain Frequency: Constant     BMI - recorded: 20.82 Nutritional Status: BMI of 19-24   Normal Nutritional Risks: None Diabetes: No  Lab Results  Component Value Date   HGBA1C 5.0 05/27/2023   HGBA1C 4.6 (L) 02/09/2021   HGBA1C 6.1 (H) 08/17/2014     How often do you need to have someone help you when you read instructions, pamphlets, or other written materials from your doctor or pharmacy?: 1 - Never What is the last grade level you completed in school?: HS  Interpreter Needed?: No  Information entered by :: [name],cma   Activities of Daily Living     11/06/2023    3:26 PM 09/16/2023   10:00 PM  In your present state of health, do you have any difficulty performing the following activities:  Hearing? 0   Vision? 0   Difficulty concentrating or making decisions? 0   Walking or climbing stairs? 0   Dressing or bathing? 0   Doing errands, shopping? 0 1  Preparing Food and eating ? N   Using the Toilet? N   In the past six months, have you accidently leaked urine? N   Do you have problems with loss of bowel control? N   Managing your Medications? N   Managing your Finances? N   Housekeeping or managing your Housekeeping? N     Patient Care Team: [name] Baller, MD as PCP - General (Family Medicine) [name] Ezra RAMAN, MD as PCP - Advanced Heart Failure (Cardiology) [name] Evalene PARAS, MD as PCP - Cardiology (Cardiology) [name] Elspeth BROCKS, MD as PCP - Electrophysiology (Cardiology) [name] Evalene PARAS, MD as Consulting Physician (Cardiology) [name] Elspeth BROCKS, MD as Consulting Physician (Cardiology) [name], [name] MATSU, MD as Consulting Physician (Vascular Surgery) [name] Annah BROCKS, MD as Consulting Physician (Hematology and Oncology) [name] Callander, MD as Consulting Physician (Oncology) [name], [facility] (Inactive) as Pharmacist (Pharmacist)  I have updated your Care Teams any recent Medical Services you may have received from other providers in the past year.     Assessment:   This is a routine wellness examination for [name].  Hearing/Vision  screen Hearing Screening - Comments:: No difficulties Vision Screening - Comments:: Patient wears OTC readers   Goals Addressed             This Visit's Progress    Patient Stated   On track    09/24/2021, stay alive       Depression Screen     11/06/2023    3:27 PM 06/24/2023   12:33 PM 02/24/2023    2:09 PM 12/09/2022   12:52 PM 10/31/2022   11:43 AM 09/11/2022   11:31 AM 07/24/2022   11:55 AM  PHQ 2/9 Scores  PHQ - 2 Score 0 0 0 0 0  0  PHQ- 9 Score 2 6  3 1  3   Exception Documentation      Patient refusal     Fall Risk     11/06/2023    3:25 PM 06/24/2023   12:33 PM  12/09/2022   12:52 PM 10/31/2022   11:43 AM 09/24/2021    1:03 PM  Fall Risk   Falls in the past year? 0 0 0 0 0  Number falls in past yr: 0    0  Injury with Fall? 0    0  Risk for fall due to : No Fall Risks    Medication side effect  Follow up Falls evaluation completed    Falls evaluation completed;Education provided;Falls prevention discussed      Data saved with a previous flowsheet row definition    MEDICARE RISK AT HOME:  Medicare Risk at Home Any stairs in or around the home?: Yes If so, are there any without handrails?: No Home free of loose throw rugs in walkways, pet beds, electrical cords, etc?: Yes Adequate lighting in your home to reduce risk of falls?: Yes Life alert?: No Use of a cane, walker or w/c?: No Grab bars in the bathroom?: Yes Shower chair or bench in shower?: Yes Elevated toilet seat or a handicapped toilet?: No  TIMED UP AND GO:  Was the test performed?  No  Cognitive Function: 6CIT completed    03/19/2016   10:50 AM  MMSE - Mini Mental State Exam  Orientation to time 5   Orientation to Place 5   Registration 3   Attention/ Calculation 0   Recall 3   Language- name 2 objects 0   Language- repeat 1  Language- follow 3 step command 3   Language- read & follow direction 0   Write a sentence 0   Copy design 0   Total score 20      Data saved with a previous  flowsheet row definition        11/06/2023    3:24 PM 09/24/2021    1:05 PM  6CIT Screen  What Year? 0 points 0 points  What month? 0 points 0 points  What time? 0 points 0 points  Count back from 20 0 points 0 points  Months in reverse 0 points 0 points  Repeat phrase 0 points 0 points  Total Score 0 points 0 points    Immunizations Immunization History  Administered Date(s) Administered   Fluad Quad(high Dose 65+) 03/06/2020, 05/10/2021   Influenza Split 03/21/2011, 02/14/2012   Influenza Whole 04/10/2009, 04/17/2010   Influenza,inj,Quad PF,6+ Mos 03/24/2013, 01/14/2014, 02/20/2015, 02/12/2016, 02/07/2017, 03/19/2018   PFIZER(Purple Top)SARS-COV-2 Vaccination 06/15/2019, 07/06/2019   Pneumococcal Conjugate-13 03/06/2015   Pneumococcal Polysaccharide-23 04/21/2001   Td 01/25/2004   Tdap 08/17/2010   Zoster, Live 04/10/2009    Screening Tests Health Maintenance  Topic Date Due   Zoster Vaccines- Shingrix (1 of 2) 03/11/1953   COVID-19 Vaccine (3 - Pfizer risk series) 08/03/2019   DTaP/Tdap/Td (3 - Td or Tdap) 08/16/2020   MAMMOGRAM  10/09/2021   INFLUENZA VACCINE  12/05/2023   Pneumococcal Vaccine: 50+ Years  Completed   DEXA SCAN  Completed   Hepatitis B Vaccines  Aged Out   HPV VACCINES  Aged Out   Meningococcal B Vaccine  Aged Out    Health Maintenance  Health Maintenance Due  Topic Date Due   Zoster Vaccines- Shingrix (1 of 2) 03/11/1953   COVID-19 Vaccine (3 - Pfizer risk series) 08/03/2019   DTaP/Tdap/Td (3 - Td or Tdap) 08/16/2020   MAMMOGRAM  10/09/2021   Health Maintenance Items Addressed:   Additional Screening:  Vision Screening: Recommended annual ophthalmology exams for early detection of glaucoma and other disorders of the eye. Would  you like a referral to an eye doctor? No    Dental Screening: Recommended annual dental exams for proper oral hygiene  Community Resource Referral / Chronic Care Management: CRR required this visit?  No    CCM required this visit?  No   Plan:    I have personally reviewed and noted the following in the patient's chart:   Medical and social history Use of alcohol, tobacco or illicit drugs  Current medications and supplements including opioid prescriptions. Patient is not currently taking opioid prescriptions. Functional ability and status Nutritional status Physical activity Advanced directives List of other physicians Hospitalizations, surgeries, and ER visits in previous 12 months Vitals Screenings to include cognitive, depression, and falls Referrals and appointments  In addition, I have reviewed and discussed with patient certain preventive protocols, quality metrics, and best practice recommendations. A written personalized care plan for preventive services as well as general preventive health recommendations were provided to patient.   [name] Right, [place]   11/06/2023   After Visit Summary: (MyChart) Due to this being a telephonic visit, the after visit summary with patients personalized plan was offered to patient via MyChart   Notes: Nothing significant to report at this time.

## 2023-11-09 NOTE — Assessment & Plan Note (Signed)
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 -She previously had reaction to blood transfusion, or required multiple premedications for blood transfusion -She was recently hospitalized for anemia and a GI bleeding in July 2024. -She is currently on ESA every 2 weeks, IV iron  as needed, and blood transfusion as needed. -Due to her multiple hospital admission for anemia and GI bleeding, she started bevacizumab  for AVM on 02/12/2023. She has missed some treatments due to hospital admission etc.

## 2023-11-09 NOTE — Progress Notes (Deleted)
 Cardiology Office Note:  .   Date:  11/09/2023  ID:  [name], DOB 24-May-1933, MRN [id number] PCP: [name], MD   [facility] Providers Cardiologist:  Evalene Lunger, MD Electrophysiologist:  Elspeth Sage, MD  Advanced Heart Failure:  Ezra Shuck, MD {  History of Present Illness: .   [name] is a 89 y.o. 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 Melvenia with [facility] and conservative management recommended)       (2022: bilateral CIA stents placed. 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 Sage, long discussion about EOL. Given DNR form and high-voltage therapies inactivated on ICD (grandson a firefighter was in agreement)  Has had recurrent hospitalizations 2/2 marked anemia and GIB's as well as volume OL  Echo (01/25): EF 65-70%, mild LVH, RV moderately reduced, severely elevated PA pressure 83.9 mmHg, mild MR, moderate MS with mean gradient of 6.0 mmHg, IVC dialted   Feb 2025, LLE cellulitis  She saw Dr. Shuck 08/06/23, Hgb back down , pending transfusion, weight stable, LLE wound healing, + orthopnea No med changes Planned for early f/u  Admitted may 2025 Sepsis Gram negative bacteremia -did not require pressors, management per primary team/ID -no clear evidence of endocarditis on echo (valves are abnormal at baseline). While we would typically consider TEE if there is clinical concern for endocarditis, her RVSP of 96 mmHg puts her at prohibitive risk for anesthesia TTE EF 60-65%, mild LVH, d-shaped interventricular septum, severe RV enlargement with moderately decreased RV systolic function, PASP 96 mmHg, severe TR, mild MR with moderate MS (mean gradient 5 mmHg), moderate AS, IVC dilated   She saw Dr.  Shuck 10/02/23 She was edematous w/weeping, erythema, no melena Started on doxycycline  > wound clinic  Discussed GOC, she wanted to remain full code.   Today's visit is scheduled as a device visit ROS:   *** all tachy therapies are OFF though reports a full code *** device clinic note June questioning need for gen change or ongoing remotes, question if need of gen change though last charted remote had > 1 year battery life in feb 2025 *** DEVICE only *** seeing wound clinic  Device information MDT dual chamber ICD implanted 02/28/2005, gen change X2 > last Oct 2017  RV lead 05/04/2010 She has an abandoned RV ICD lead  Arrhythmia/AAD hx Permanent AFib Historically failed, amio, flecainide  and Tikosyn  OFF /c 2/2 recurrent GIBs Not felt to be a watchman candidate  Studies Reviewed: SABRA    EKG not done today  DEVICE interrogation done today and reviewed by myself *** Battery and lead measurements are good ***   Risk Assessment/Calculations:    Physical Exam:   VS:  There were no vitals taken for this visit.   Wt Readings from Last 3 Encounters:  11/06/23 129 lb (58.5 kg)  10/01/23 129 lb 4.8 oz (58.7 kg)  09/16/23 129 lb (58.5 kg)    GEN: Well nourished, well developed in no acute distress NECK: No JVD; No carotid bruits CARDIAC: ***RRR, no murmurs, rubs, gallops RESPIRATORY:  *** CTA b/l without rales, wheezing or rhonchi  ABDOMEN: Soft, non-tender, non-distended EXTREMITIES: *** No edema; No deformity   ICD site: *** is stable, no thinning, fluctuation, tethering  ASSESSMENT AND PLAN: .    ICD ***  Permanent  AFib Has failed a number of AADs Off a/c secondary to recurrent GIBs Not felt to be a watchman candidate *** rates       {Are you ordering a CV Procedure (e.g. stress test, cath, DCCV, TEE, etc)?   Press F2        :[id number]}     Dispo: ***  Signed, Charlies Macario Arthur, PA-C

## 2023-11-09 NOTE — Assessment & Plan Note (Signed)
 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   -Outpatient EUS 03/04/2022 by Dr. 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 Freida Busman did not recommend surgery in multifocal NET and also due to her age and co-morbidities. -She began first line sandostatin injections q28 days on 03/27/22.  -Dotatate PET scan, done 2 weeks after first injection 04/10/22, shows no significant tracer avidity in the pancrease or distant sites. Findings are likely due to close timing relative to the sandostatin injection blocking receptors. When we repeat this for restaging next year, will arrange 4 weeks after sandostatin -She has persistent anemia, likely secondary to GI bleeding from the tumor and AVM, she has had multiple hospital admission for anemia and bleeding.

## 2023-11-10 ENCOUNTER — Inpatient Hospital Stay

## 2023-11-10 ENCOUNTER — Inpatient Hospital Stay: Attending: Nurse Practitioner

## 2023-11-10 ENCOUNTER — Inpatient Hospital Stay: Admitting: Hematology

## 2023-11-10 VITALS — BP 159/57 | HR 59 | Temp 97.9°F | Resp 16 | Ht 66.0 in | Wt 134.0 lb

## 2023-11-10 DIAGNOSIS — D649 Anemia, unspecified: Secondary | ICD-10-CM

## 2023-11-10 DIAGNOSIS — D500 Iron deficiency anemia secondary to blood loss (chronic): Secondary | ICD-10-CM

## 2023-11-10 DIAGNOSIS — N184 Chronic kidney disease, stage 4 (severe): Secondary | ICD-10-CM | POA: Insufficient documentation

## 2023-11-10 DIAGNOSIS — D631 Anemia in chronic kidney disease: Secondary | ICD-10-CM | POA: Insufficient documentation

## 2023-11-10 DIAGNOSIS — D3A8 Other benign neuroendocrine tumors: Secondary | ICD-10-CM | POA: Diagnosis not present

## 2023-11-10 DIAGNOSIS — K5520 Angiodysplasia of colon without hemorrhage: Secondary | ICD-10-CM

## 2023-11-10 LAB — CBC WITH DIFFERENTIAL (CANCER CENTER ONLY)
Abs Immature Granulocytes: 0.01 K/uL (ref 0.00–0.07)
Basophils Absolute: 0 K/uL (ref 0.0–0.1)
Basophils Relative: 1 %
Eosinophils Absolute: 0 K/uL (ref 0.0–0.5)
Eosinophils Relative: 0 %
HCT: 30.5 % — ABNORMAL LOW (ref 36.0–46.0)
Hemoglobin: 9.1 g/dL — ABNORMAL LOW (ref 12.0–15.0)
Immature Granulocytes: 0 %
Lymphocytes Relative: 15 %
Lymphs Abs: 0.5 K/uL — ABNORMAL LOW (ref 0.7–4.0)
MCH: 23.9 pg — ABNORMAL LOW (ref 26.0–34.0)
MCHC: 29.8 g/dL — ABNORMAL LOW (ref 30.0–36.0)
MCV: 80.1 fL (ref 80.0–100.0)
Monocytes Absolute: 0.6 K/uL (ref 0.1–1.0)
Monocytes Relative: 16 %
Neutro Abs: 2.4 K/uL (ref 1.7–7.7)
Neutrophils Relative %: 68 %
Platelet Count: 168 K/uL (ref 150–400)
RBC: 3.81 MIL/uL — ABNORMAL LOW (ref 3.87–5.11)
RDW: 21 % — ABNORMAL HIGH (ref 11.5–15.5)
WBC Count: 3.6 K/uL — ABNORMAL LOW (ref 4.0–10.5)
nRBC: 0 % (ref 0.0–0.2)

## 2023-11-10 LAB — SAMPLE TO BLOOD BANK

## 2023-11-10 MED ORDER — OCTREOTIDE ACETATE 20 MG IM KIT
20.0000 mg | PACK | Freq: Once | INTRAMUSCULAR | Status: AC
  Administered 2023-11-10: 20 mg via INTRAMUSCULAR
  Filled 2023-11-10: qty 1

## 2023-11-10 MED ORDER — EPOETIN ALFA-EPBX 40000 UNIT/ML IJ SOLN
40000.0000 [IU] | Freq: Once | INTRAMUSCULAR | Status: AC
  Administered 2023-11-10: 40000 [IU] via SUBCUTANEOUS
  Filled 2023-11-10: qty 1

## 2023-11-10 MED ORDER — TRAMADOL HCL 50 MG PO TABS: 50.0000 mg | ORAL_TABLET | Freq: Two times a day (BID) | ORAL | 0 refills | Status: DC | PRN

## 2023-11-10 MED ORDER — SODIUM CHLORIDE 0.9 % IV SOLN
5.0000 mg/kg | Freq: Once | INTRAVENOUS | Status: AC
  Administered 2023-11-10: 300 mg via INTRAVENOUS
  Filled 2023-11-10: qty 12

## 2023-11-10 MED ORDER — SODIUM CHLORIDE 0.9% FLUSH: 10.0000 mL | INTRAVENOUS | Status: DC | PRN

## 2023-11-10 MED ORDER — SODIUM CHLORIDE 0.9 % IV SOLN: Freq: Once | INTRAVENOUS | Status: AC

## 2023-11-10 NOTE — Patient Instructions (Signed)
 [facility] - A DEPT OF [facility]  Discharge Instructions: Thank you for choosing [facility] to provide your oncology and hematology care.   If you have a lab appointment with the Cancer Center, please go directly to the Cancer Center and check in at the registration area.   Wear comfortable clothing and clothing appropriate for easy access to any Portacath or PICC line.   We strive to give you quality time with your provider. You may need to reschedule your appointment if you arrive late (15 or more minutes).  Arriving late affects you and other patients whose appointments are after yours.  Also, if you miss three or more appointments without notifying the office, you may be dismissed from the clinic at the provider's discretion.      For prescription refill requests, have your pharmacy contact our office and allow 72 hours for refills to be completed.    Today you received the following chemotherapy and/or immunotherapy agents Bevacizumab      To help prevent nausea and vomiting after your treatment, we encourage you to take your nausea medication as directed.  BELOW ARE SYMPTOMS THAT SHOULD BE REPORTED IMMEDIATELY: *FEVER GREATER THAN 100.4 F (38 C) OR HIGHER *CHILLS OR SWEATING *NAUSEA AND VOMITING THAT IS NOT CONTROLLED WITH YOUR NAUSEA MEDICATION *UNUSUAL SHORTNESS OF BREATH *UNUSUAL BRUISING OR BLEEDING *URINARY PROBLEMS (pain or burning when urinating, or frequent urination) *BOWEL PROBLEMS (unusual diarrhea, constipation, pain near the anus) TENDERNESS IN MOUTH AND THROAT WITH OR WITHOUT PRESENCE OF ULCERS (sore throat, sores in mouth, or a toothache) UNUSUAL RASH, SWELLING OR PAIN  UNUSUAL VAGINAL DISCHARGE OR ITCHING   Items with * indicate a potential emergency and should be followed up as soon as possible or go to the Emergency Department if any problems should occur.  Please show the CHEMOTHERAPY ALERT CARD or IMMUNOTHERAPY  ALERT CARD at check-in to the Emergency Department and triage nurse.  Should you have questions after your visit or need to cancel or reschedule your appointment, please contact [facility] - A DEPT OF [facility]  Dept: [phone]  and follow the prompts.  Office hours are 8:00 a.m. to 4:30 p.m. Monday - Friday. Please note that voicemails left after 4:00 p.m. may not be returned until the following business day.  We are closed weekends and major holidays. You have access to a nurse at all times for urgent questions. Please call the main number to the clinic Dept: [phone] and follow the prompts.   For any non-urgent questions, you may also contact your provider using MyChart. We now offer e-Visits for anyone 26 and older to request care online for non-urgent symptoms. For details visit [email or web address].   Also download the MyChart app! Go to the app store, search "MyChart", open the app, select [facility], and log in with your MyChart username and password.  Bevacizumab Injection What is this medication? BEVACIZUMAB (be va SIZ yoo mab) treats some types of cancer. It works by blocking a protein that causes cancer cells to grow and multiply. This helps to slow or stop the spread of cancer cells. It is a monoclonal antibody. This medicine may be used for other purposes; ask your health care provider or pharmacist if you have questions. COMMON BRAND NAME(S): Alymsys, Avastin, MVASI, Rosaland Lao What should I tell my care team before I take this medication? They need to know if you  have any of these conditions: Blood clots Coughing up blood Having or recent surgery Heart failure High blood pressure History of a connection between 2 or more body parts that do not usually connect (fistula) History of a tear in your stomach or intestines Protein in your urine An unusual or allergic reaction to bevacizumab, other medications, foods, dyes, or  preservatives Pregnant or trying to get pregnant Breast-feeding How should I use this medication? This medication is injected into a vein. It is given by your care team in a hospital or clinic setting. Talk to your care team the use of this medication in children. Special care may be needed. Overdosage: If you think you have taken too much of this medicine contact a poison control center or emergency room at once. NOTE: This medicine is only for you. Do not share this medicine with others. What if I miss a dose? Keep appointments for follow-up doses. It is important not to miss your dose. Call your care team if you are unable to keep an appointment. What may interact with this medication? Interactions are not expected. This list may not describe all possible interactions. Give your health care provider a list of all the medicines, herbs, non-prescription drugs, or dietary supplements you use. Also tell them if you smoke, drink alcohol, or use illegal drugs. Some items may interact with your medicine. What should I watch for while using this medication? Your condition will be monitored carefully while you are receiving this medication. You may need blood work while taking this medication. This medication may make you feel generally unwell. This is not uncommon as chemotherapy can affect healthy cells as well as cancer cells. Report any side effects. Continue your course of treatment even though you feel ill unless your care team tells you to stop. This medication may increase your risk to bruise or bleed. Call your care team if you notice any unusual bleeding. Before having surgery, talk to your care team to make sure it is ok. This medication can increase the risk of poor healing of your surgical site or wound. You will need to stop this medication for 28 days before surgery. After surgery, wait at least 28 days before restarting this medication. Make sure the surgical site or wound is healed enough  before restarting this medication. Talk to your care team if questions. Talk to your care team if you may be pregnant. Serious birth defects can occur if you take this medication during pregnancy and for 6 months after the last dose. Contraception is recommended while taking this medication and for 6 months after the last dose. Your care team can help you find the option that works for you. Do not breastfeed while taking this medication and for 6 months after the last dose. This medication can cause infertility. Talk to your care team if you are concerned about your fertility. What side effects may I notice from receiving this medication? Side effects that you should report to your care team as soon as possible: Allergic reactions--skin rash, itching, hives, swelling of the face, lips, tongue, or throat Bleeding--bloody or black, tar-like stools, vomiting blood or brown material that looks like coffee grounds, red or dark brown urine, small red or purple spots on skin, unusual bruising or bleeding Blood clot--pain, swelling, or warmth in the leg, shortness of breath, chest pain Heart attack--pain or tightness in the chest, shoulders, arms, or jaw, nausea, shortness of breath, cold or clammy skin, feeling faint or lightheaded Heart failure--shortness  of breath, swelling of the ankles, feet, or hands, sudden weight gain, unusual weakness or fatigue Increase in blood pressure Infection--fever, chills, cough, sore throat, wounds that don't heal, pain or trouble when passing urine, general feeling of discomfort or being unwell Infusion reactions--chest pain, shortness of breath or trouble breathing, feeling faint or lightheaded Kidney injury--decrease in the amount of urine, swelling of the ankles, hands, or feet Stomach pain that is severe, does not go away, or gets worse Stroke--sudden numbness or weakness of the face, arm, or leg, trouble speaking, confusion, trouble walking, loss of balance or  coordination, dizziness, severe headache, change in vision Sudden and severe headache, confusion, change in vision, seizures, which may be signs of posterior reversible encephalopathy syndrome (PRES) Side effects that usually do not require medical attention (report to your care team if they continue or are bothersome): Back pain Change in taste Diarrhea Dry skin Increased tears Nosebleed This list may not describe all possible side effects. Call your doctor for medical advice about side effects. You may report side effects to FDA at 1-800-FDA-1088. Where should I keep my medication? This medication is given in a hospital or clinic. It will not be stored at home. NOTE: This sheet is a summary. It may not cover all possible information. If you have questions about this medicine, talk to your doctor, pharmacist, or health care provider.  2024 Elsevier/Gold Standard (2021-09-07 00:00:00)

## 2023-11-10 NOTE — Progress Notes (Signed)
 [facility]   [phone] [phone]   Clinic Follow up Note   Patient Care Team: [name], MD as PCP - General (Family Medicine) [name], MD as PCP - Advanced Heart Failure (Cardiology) [name], MD as PCP - Cardiology (Cardiology) [name], MD as PCP - Electrophysiology (Cardiology) [name], MD as Consulting Physician (Cardiology) [name], MD as Consulting Physician (Cardiology) [name], [name], MD as Consulting Physician (Vascular Surgery) [name], MD as Consulting Physician (Hematology and Oncology) [name], MD as Consulting Physician (Oncology) [name], [facility] (Inactive) as Pharmacist (Pharmacist)  Date of Service:  11/10/2023  CHIEF COMPLAINT: f/u of neuroendocrine tumor and anemia  CURRENT THERAPY:  Bevacizumab  every 2 weeks Sandostatin  injection every months Retacrit  every 2 weeks if hemoglobin less than 11  Oncology History   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   -Outpatient EUS 03/04/2022 by Dr. 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 -She began first line sandostatin  injections q28 days on 03/27/22.  -Dotatate PET scan, done 2 weeks after first injection 04/10/22, shows no significant tracer avidity in the pancrease or distant sites. Findings are likely due to close timing relative to the  sandostatin  injection blocking receptors. 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 -She previously had reaction to blood transfusion, or required multiple premedications for blood transfusion -She was recently hospitalized for anemia and a GI bleeding in July 2024. -She is currently on ESA every 2 weeks, IV iron  as needed, and blood transfusion as needed. -Due to her multiple hospital admission for anemia and GI bleeding, she started bevacizumab  for AVM on 02/12/2023. She has missed some treatments due to hospital admission etc.   Assessment & Plan Neuroendocrine tumor She is undergoing treatment for a neuroendocrine tumor with octreotide . - Administer octreotide  injection today - Schedule monthly octreotide  injections  Pain management She experiences pain related to her neuroendocrine tumor and possibly her leg wound. Tramadol  is effective for her pain management, primarily at night. - Prescribe tramadol , 50 mg, up to twice a day as needed - Advise continuation of Tylenol  for pain management - Coordinate with other specialists for wound-related pain management  Anemia Her anemia is being monitored with a current blood count of 9.1. She has been receiving bevacizumab  to slow down bleeding. - Administer bevacizumab  infusion every two weeks - Schedule blood  transfusion two days after bevacizumab  infusion if needed - Administer IV iron   if blood counts are low - Monitor blood counts more frequently  Plan - I refilled tramadol  for her - Lab reviewed, no blood transfusion needed at this week - Proceed to bevacizumab  today and continue every 2 weeks - She is also due for Sandostatin  injection and Retacrit  today - Follow-up in 4 weeks   Discussed the use of AI scribe software for clinical note transcription with the patient, who gave verbal consent to proceed.  History of Present Illness The patient is an 89 year old with a neuroendocrine tumor and anemia who presents for follow-up.  She experiences significant sharp pain daily, especially when sitting up straight, which began a month ago. She manages the pain with tramadol , 50 mg, primarily at night, sometimes combined with Tylenol .  Her anemia is under monitoring, and she has received intermittent bevacizumab , with two doses in June. She receives a monthly octreotide  injection.  She has ongoing leg issues, usually wrapping it, and attends a wound clinic for this problem.     All other systems were reviewed with the patient and are negative.  MEDICAL HISTORY:  Past Medical History:  Diagnosis Date   (HFpEF) heart failure with preserved ejection fraction (HCC)    EF=60-60%   Actinic keratosis 01/17/2015   R forearm   Adjustment disorder with anxiety    Adverse effect of other narcotics, sequela    Intolerance to all narcotics   Anti-Duffy antibodies present    Aortic valve stenosis    Arthritis    some in my hands (11/11/2012)   Atrial fibrillation, permanent (HCC)    Eliquis    Atypical mole 03/25/2018   L forearm - severe   Automatic implantable cardioverter-defibrillator in situ    Avascular necrosis of hip (HCC) 05/03/2011   Carotid artery stenosis 09/2007   a. 09/2007: 60-79% bilateral (stable); b. 10/2008: 40-59% R 60-79%    Cellulitis of left lower extremity 07/13/2020   CKD (chronic kidney disease), stage III (HCC)    COPD (chronic obstructive  pulmonary disease) (HCC)    Coronary artery disease    non-obstructive by 2006 cath   COVID-19 virus infection 02/12/2022   Displaced fracture of left femoral neck (HCC) 10/09/2018   GI bleed 03/28/2020   AVM   High cholesterol    Hypertension 05/20/2011   Hypertr obst cardiomyop    Hypotension, unspecified    cardiac cath 2006..nonobstructive CAD 30-40s lesions.SABRAETT 1/09 nondiagnostic due to poor HR response..Right Renal Cancer 2003   Iron  deficiency anemia    Long term (current) use of anticoagulants    On home oxygen  therapy    3L [place]   Osteoarthritis of right hip    PAD (peripheral artery disease) (HCC)    Pancreatic cancer (HCC)    sandostatin    PONV (postoperative nausea and vomiting)    Presence of permanent cardiac pacemaker    Pulmonary HTN (HCC)    RECTAL BLEEDING 10/13/2009   Qualifier: Diagnosis of  By: Kerman NP, Paula     Red blood cell antibody positive with compatible PRBC difficult to obtain    Anti FYA (Duffy a) antibody. Must be transfused with PRB which are Duffy Antigen Negative and Crossmatch Compatible   Renal cell carcinoma (HCC) 2005   s/p right nephrectomy   Squamous cell carcinoma of skin 01/17/2015   R lat wrist   Squamous cell carcinoma of skin 01/29/2018   R post upper leg - superficially invasive  Squamous cell carcinoma of skin 03/25/2018   L lat foot   Squamous cell carcinoma of skin 11/09/2020   left lat foot - EDC 01/01/21, recurrent 01/31/21 - MOHs 02/22/21   Squamous cell carcinoma of skin 12/19/2021   Right Posterior Medial Thigh, EDC   Squamous cell carcinoma of skin 12/19/2021   SCC IS, L lat heel, EDC 02/04/2022   Squamous cell carcinoma of skin 01/21/2022   L forearm, EDC 02/04/2022   Squamous cell carcinoma of skin 01/21/2022   R lower leg below knee, EDC 02/04/2022   Squamous cell carcinoma of skin 01/21/2022   SCCIS, R post heel, EDC 02/04/2022   Squamous cell carcinoma of skin 07/01/2022   left lower abdomen, in situ, EDC    Squamous cell carcinoma of skin 07/01/2022   left medial chest, EDC   Urge incontinence    Venous stasis of both lower extremities     SURGICAL HISTORY: Past Surgical History:  Procedure Laterality Date   ABDOMINAL AORTOGRAM W/LOWER EXTREMITY N/A 02/12/2021   Procedure: ABDOMINAL AORTOGRAM W/LOWER EXTREMITY;  Surgeon: Sheree Penne Bruckner, MD;  Location[facility] INVASIVE CV LAB;  Service: Cardiovascular;  Laterality: N/A;   ABDOMINAL HYSTERECTOMY  1975   for benign causes   APPENDECTOMY     BI-VENTRICULAR PACEMAKER UPGRADE  05/04/2010   BIOPSY  02/28/2019   Procedure: BIOPSY;  Surgeon: Teressa Toribio SQUIBB, MD;  Location: [facility] ENDOSCOPY;  Service: Endoscopy;;   BIOPSY  03/04/2022   Procedure: BIOPSY;  Surgeon: Wilhelmenia Aloha Raddle., MD;  Location: [facility];  Service: Gastroenterology;;   CARDIAC CATHETERIZATION  2006   CARDIOVERSION N/A 02/20/2018   Procedure: CARDIOVERSION;  Surgeon: [name], MD;  Location: [facility] ORS;  Service: Cardiovascular;  Laterality: N/A;   CARDIOVERSION N/A 03/27/2018   Procedure: CARDIOVERSION;  Surgeon: [name], MD;  Location: [facility] ORS;  Service: Cardiovascular;  Laterality: N/A;   CATARACT EXTRACTION W/ INTRAOCULAR LENS  IMPLANT, BILATERAL  01/2006-02-2006   CHOLECYSTECTOMY N/A 11/11/2012   Procedure: LAPAROSCOPIC CHOLECYSTECTOMY WITH INTRAOPERATIVE CHOLANGIOGRAM;  Surgeon: Donnice POUR. Belinda, MD;  Location: [facility] OR;  Service: General;  Laterality: N/A;   COLONOSCOPY WITH PROPOFOL  N/A 02/28/2019   Procedure: COLONOSCOPY WITH PROPOFOL ;  Surgeon: Teressa Toribio SQUIBB, MD;  Location: [facility] ENDOSCOPY;  Service: Endoscopy;  Laterality: N/A;   ENTEROSCOPY N/A 03/30/2020   Procedure: ENTEROSCOPY;  Surgeon: San Sandor GAILS, DO;  Location: [facility] ENDOSCOPY;  Service: Gastroenterology;  Laterality: N/A;   ENTEROSCOPY N/A 02/10/2021   Procedure: ENTEROSCOPY;  Surgeon: Rollin Dover, MD;  Location: [facility] ENDOSCOPY;  Service: Endoscopy;  Laterality: N/A;    ENTEROSCOPY N/A 02/15/2022   Procedure: ENTEROSCOPY;  Surgeon: Federico Rosario BROCKS, MD;  Location: [facility] ENDOSCOPY;  Service: Gastroenterology;  Laterality: N/A;   ENTEROSCOPY N/A 11/06/2022   Procedure: ENTEROSCOPY;  Surgeon: Wilhelmenia Aloha Raddle., MD;  Location: [facility] ENDOSCOPY;  Service: Gastroenterology;  Laterality: N/A;   EP IMPLANTABLE DEVICE N/A 02/21/2016   Procedure: ICD Generator Changeout;  Surgeon: Elspeth BROCKS Sage, MD;  Location: [facility] INVASIVE CV LAB;  Service: Cardiovascular;  Laterality: N/A;   ESOPHAGOGASTRODUODENOSCOPY N/A 03/04/2022   Procedure: ESOPHAGOGASTRODUODENOSCOPY (EGD);  Surgeon: Wilhelmenia Aloha Raddle., MD;  Location: [facility];  Service: Gastroenterology;  Laterality: N/A;   ESOPHAGOGASTRODUODENOSCOPY (EGD) WITH PROPOFOL  N/A 02/28/2019   Procedure: ESOPHAGOGASTRODUODENOSCOPY (EGD) WITH PROPOFOL ;  Surgeon: Teressa Toribio SQUIBB, MD;  Location: [facility] ENDOSCOPY;  Service: Endoscopy;  Laterality: N/A;   EUS N/A 03/04/2022   Procedure: UPPER ENDOSCOPIC ULTRASOUND (EUS) RADIAL;  Surgeon: Wilhelmenia Aloha Raddle.,  MD;  Location: [facility] ENDOSCOPY;  Service: Gastroenterology;  Laterality: N/A;   FINE NEEDLE ASPIRATION N/A 03/04/2022   Procedure: FINE NEEDLE ASPIRATION (FNA) LINEAR;  Surgeon: Wilhelmenia Aloha Raddle., MD;  Location: [facility] ENDOSCOPY;  Service: Gastroenterology;  Laterality: N/A;   GIVENS CAPSULE STUDY N/A 03/15/2019   Procedure: GIVENS CAPSULE STUDY;  Surgeon: Eda Iha, MD;  Location: [facility] ENDOSCOPY;  Service: Gastroenterology;  Laterality: N/A;   HOT HEMOSTASIS N/A 03/30/2020   Procedure: HOT HEMOSTASIS (ARGON PLASMA COAGULATION/BICAP);  Surgeon: San Sandor GAILS, DO;  Location: [facility] ENDOSCOPY;  Service: Gastroenterology;  Laterality: N/A;   HOT HEMOSTASIS N/A 02/10/2021   Procedure: HOT HEMOSTASIS (ARGON PLASMA COAGULATION/BICAP);  Surgeon: Rollin Dover, MD;  Location: [facility] ENDOSCOPY;  Service: Endoscopy;  Laterality: N/A;   HOT HEMOSTASIS N/A 02/15/2022   Procedure: HOT HEMOSTASIS  (ARGON PLASMA COAGULATION/BICAP);  Surgeon: Federico Rosario BROCKS, MD;  Location: [facility] ENDOSCOPY;  Service: Gastroenterology;  Laterality: N/A;   HOT HEMOSTASIS N/A 11/06/2022   Procedure: HOT HEMOSTASIS (ARGON PLASMA COAGULATION/BICAP);  Surgeon: Wilhelmenia Aloha Raddle., MD;  Location: [facility] ENDOSCOPY;  Service: Gastroenterology;  Laterality: N/A;   INSERT / REPLACE / REMOVE PACEMAKER  05-01-11   02-28-05-/05-04-10-ICD-MEDTRONIC MAXIMAL DR   IR ANGIOGRAM SELECTIVE EACH ADDITIONAL VESSEL  02/18/2022   IR ANGIOGRAM SELECTIVE EACH ADDITIONAL VESSEL  02/18/2022   IR ANGIOGRAM VISCERAL SELECTIVE  02/16/2022   IR ANGIOGRAM VISCERAL SELECTIVE  02/16/2022   IR EMBO ART  VEN HEMORR LYMPH EXTRAV  INC GUIDE ROADMAPPING  02/16/2022   IR THORACENTESIS ASP PLEURAL SPACE W/IMG GUIDE  02/21/2022   IR THORACENTESIS ASP PLEURAL SPACE W/IMG GUIDE  02/22/2022   IR US  GUIDE VASC ACCESS RIGHT  02/16/2022   JOINT REPLACEMENT     LAPAROSCOPIC CHOLECYSTECTOMY  11/11/2012   LAPAROSCOPIC LYSIS OF ADHESIONS N/A 11/11/2012   Procedure: LAPAROSCOPIC LYSIS OF ADHESIONS;  Surgeon: Donnice POUR. Belinda, MD;  Location: [facility] OR;  Service: General;  Laterality: N/A;   NEPHRECTOMY Right 06/2001    S/P RENAL CELL CANCER   PERIPHERAL VASCULAR INTERVENTION Bilateral 02/12/2021   Procedure: PERIPHERAL VASCULAR INTERVENTION;  Surgeon: Sheree Penne Bruckner, MD;  Location: [facility] INVASIVE CV LAB;  Service: Cardiovascular;  Laterality: Bilateral;  Iliac artery stents   PRESSURE SENSOR/CARDIOMEMS N/A 02/03/2019   Procedure: PRESSURE SENSOR/CARDIOMEMS;  Surgeon: [name], MD;  Location: [facility] INVASIVE CV LAB;  Service: Cardiovascular;  Laterality: N/A;   RIGHT HEART CATH N/A 11/09/2018   Procedure: RIGHT HEART CATH;  Surgeon: [name], MD;  Location: [facility] INVASIVE CV LAB;  Service: Cardiovascular;  Laterality: N/A;   RIGHT HEART CATH N/A 03/08/2019   Procedure: RIGHT HEART CATH;  Surgeon: [name], MD;  Location: [facility] INVASIVE CV LAB;  Service:  Cardiovascular;  Laterality: N/A;   SUBMUCOSAL TATTOO INJECTION  02/28/2019   Procedure: SUBMUCOSAL TATTOO INJECTION;  Surgeon: Teressa Toribio SQUIBB, MD;  Location: [facility] ENDOSCOPY;  Service: Endoscopy;;   SUBMUCOSAL TATTOO INJECTION  11/06/2022   Procedure: SUBMUCOSAL TATTOO INJECTION;  Surgeon: Wilhelmenia Aloha Raddle., MD;  Location: [facility] ENDOSCOPY;  Service: Gastroenterology;;   TOTAL HIP ARTHROPLASTY Right 05/03/2011   Procedure: TOTAL HIP ARTHROPLASTY ANTERIOR APPROACH;  Surgeon: Bruckner CINDERELLA Poli;  Location: [facility] ORS;  Service: Orthopedics;  Laterality: Right;  Removal of Cannulated Screws Right Hip, Right Direct Anterior Hip Replacement   TOTAL HIP ARTHROPLASTY Left 10/09/2018   Procedure: TOTAL HIP ARTHROPLASTY ANTERIOR APPROACH;  Surgeon: Fidel Rogue, MD;  Location: [facility] OR;  Service: Orthopedics;  Laterality: Left;    I have  reviewed the social history and family history with the patient and they are unchanged from previous note.  ALLERGIES:  is allergic to codeine, dilaudid  [hydromorphone ], tape, amoxil  [amoxicillin ], asa [aspirin ], ms contin  [morphine ], neurontin  [gabapentin ], and nsaids.  MEDICATIONS:  Current Outpatient Medications  Medication Sig Dispense Refill   acetaminophen  (TYLENOL ) 500 MG tablet Take 1,000 mg by mouth every 8 (eight) hours.     Ascorbic Acid  (VITAMIN C  PO) Take 1 tablet by mouth daily.     Cholecalciferol  (VITAMIN D -3 PO) Take 2 capsules by mouth daily.     clotrimazole -betamethasone  (LOTRISONE ) cream Apply topically 2 (two) times daily. 30 g 0   CRANBERRY PO Take 1 tablet by mouth daily.     Cyanocobalamin  (B-12 PO) Take 1 tablet by mouth daily.     ferrous gluconate  (FERGON) 324 MG tablet Take 1 tablet (324 mg total) by mouth daily with breakfast. 30 tablet 3   fluticasone  (FLONASE ) 50 MCG/ACT nasal spray Place 2 sprays into both nostrils daily as needed for allergies or rhinitis. 16 g 0   methocarbamol  (ROBAXIN ) 500 MG tablet Take 1 tablet (500 mg total) by  mouth every 8 (eight) hours as needed for muscle spasms. 30 tablet 0   metolazone  (ZAROXOLYN ) 2.5 MG tablet Take 1 tablet (2.5 mg total) by mouth 3 (three) times a week. Every Tues., Thurs and Sat. with 40 meq of potassium 90 tablet 3   Multiple Vitamins-Minerals (HAIR SKIN NAILS PO) Take 1 capsule by mouth daily.     pantoprazole  (PROTONIX ) 40 MG tablet Take 1 tablet (40 mg total) by mouth daily. 30 tablet 3   Potassium Chloride  ER 20 MEQ TBCR Take 1 tablet (20 mEq total) by mouth daily. 30 tablet 2   rosuvastatin  (CRESTOR ) 10 MG tablet Take 1 tablet (10 mg total) by mouth daily. 30 tablet 0   torsemide  (DEMADEX ) 20 MG tablet Take 5 tablets (100 mg total) by mouth 2 (two) times daily. 250 tablet 3   traMADol  (ULTRAM ) 50 MG tablet Take 1 tablet (50 mg total) by mouth 2 (two) times daily as needed. 30 tablet 0   No current facility-administered medications for this visit.   Facility-Administered Medications Ordered in Other Visits  Medication Dose Route Frequency Provider Last Rate Last Admin   sodium chloride  flush (NS) 0.9 % injection 10 mL  10 mL Intracatheter PRN [name], MD        PHYSICAL EXAMINATION: ECOG PERFORMANCE STATUS: 3 - Symptomatic, >50% confined to bed  Vitals:   11/10/23 1110 11/10/23 1111  BP: (!) 158/75 (!) 159/57  Pulse: (!) 119 (!) 59  Resp: 16   Temp: 97.9 F (36.6 C)   SpO2: 100%    Wt Readings from Last 3 Encounters:  11/10/23 134 lb (60.8 kg)  11/06/23 129 lb (58.5 kg)  10/01/23 129 lb 4.8 oz (58.7 kg)     GENERAL:alert, no distress and comfortable SKIN: (+) Open wound in left leg and foot, extensive skin changes in bilateral lower extremity EYES: normal, Conjunctiva are pink and non-injected, sclera clear NECK: supple, thyroid  normal size, non-tender, without nodularity Musculoskeletal:no cyanosis of digits and no clubbing  NEURO: alert & oriented x 3 with fluent speech, no focal motor/sensory deficits  Physical Exam    LABORATORY DATA:  I  have reviewed the data as listed    Latest Ref Rng & Units 11/10/2023   10:48 AM 10/24/2023    2:51 PM 10/14/2023    3:48 PM  CBC  WBC 4.0 -  10.5 K/uL 3.6  3.5  3.3   Hemoglobin 12.0 - 15.0 g/dL 9.1  8.8  7.7   Hematocrit 36.0 - 46.0 % 30.5  29.6  26.4   Platelets 150 - 400 K/uL 168  164  157         Latest Ref Rng & Units 10/01/2023    3:05 PM 09/17/2023    6:03 AM 09/16/2023    2:53 PM  CMP  Glucose 70 - 99 mg/dL 94  845  889   BUN 8 - 23 mg/dL 54  73  72   Creatinine 0.44 - 1.00 mg/dL 7.60  8.28  8.34   Sodium 135 - 145 mmol/L 138  136  134   Potassium 3.5 - 5.1 mmol/L 3.5  4.1  4.5   Chloride 98 - 111 mmol/L 95  93  89   CO2 22 - 32 mmol/L 33  32  37   Calcium  8.9 - 10.3 mg/dL 9.6  9.0  9.4   Total Protein 6.5 - 8.1 g/dL 8.1   7.8   Total Bilirubin 0.0 - 1.2 mg/dL 0.6   1.1   Alkaline Phos 38 - 126 U/L 52   61   AST 15 - 41 U/L 15   20   ALT 0 - 44 U/L 6   12       RADIOGRAPHIC STUDIES: I have personally reviewed the radiological images as listed and agreed with the findings in the report. No results found.    Orders Placed This Encounter  Procedures   Ferritin    Standing Status:   Standing    Number of Occurrences:   50    Expiration Date:   11/09/2024   All questions were answered. The patient knows to call the clinic with any problems, questions or concerns. No barriers to learning was detected. The total time spent in the appointment was 30 minutes, including review of chart and various tests results, discussions about plan of care and coordination of care plan     Onita Mattock, MD 11/10/2023

## 2023-11-11 ENCOUNTER — Ambulatory Visit: Attending: Physician Assistant | Admitting: Physician Assistant

## 2023-11-12 ENCOUNTER — Encounter: Payer: Self-pay | Admitting: Physician Assistant

## 2023-11-12 ENCOUNTER — Other Ambulatory Visit: Payer: Self-pay | Admitting: Hematology

## 2023-11-17 ENCOUNTER — Encounter: Payer: Self-pay | Admitting: Hematology

## 2023-11-17 ENCOUNTER — Telehealth: Payer: Self-pay | Admitting: Hematology

## 2023-11-17 NOTE — Telephone Encounter (Signed)
 Scheduled appointments per WQ. Talked with the patient and she is aware of all made appointments.

## 2023-11-18 ENCOUNTER — Other Ambulatory Visit: Payer: Self-pay | Admitting: Vascular Surgery

## 2023-11-18 ENCOUNTER — Ambulatory Visit: Admitting: Family Medicine

## 2023-11-18 DIAGNOSIS — M7989 Other specified soft tissue disorders: Secondary | ICD-10-CM

## 2023-11-19 ENCOUNTER — Inpatient Hospital Stay

## 2023-11-21 ENCOUNTER — Inpatient Hospital Stay

## 2023-11-24 ENCOUNTER — Inpatient Hospital Stay

## 2023-11-24 VITALS — BP 150/46 | HR 62 | Temp 99.3°F | Resp 16

## 2023-11-24 DIAGNOSIS — K5520 Angiodysplasia of colon without hemorrhage: Secondary | ICD-10-CM

## 2023-11-24 DIAGNOSIS — N184 Chronic kidney disease, stage 4 (severe): Secondary | ICD-10-CM | POA: Diagnosis not present

## 2023-11-24 DIAGNOSIS — D500 Iron deficiency anemia secondary to blood loss (chronic): Secondary | ICD-10-CM

## 2023-11-24 DIAGNOSIS — D649 Anemia, unspecified: Secondary | ICD-10-CM

## 2023-11-24 LAB — CBC WITH DIFFERENTIAL (CANCER CENTER ONLY)
Abs Immature Granulocytes: 0.01 K/uL (ref 0.00–0.07)
Basophils Absolute: 0 K/uL (ref 0.0–0.1)
Basophils Relative: 0 %
Eosinophils Absolute: 0 K/uL (ref 0.0–0.5)
Eosinophils Relative: 0 %
HCT: 32.7 % — ABNORMAL LOW (ref 36.0–46.0)
Hemoglobin: 9.7 g/dL — ABNORMAL LOW (ref 12.0–15.0)
Immature Granulocytes: 0 %
Lymphocytes Relative: 11 %
Lymphs Abs: 0.5 K/uL — ABNORMAL LOW (ref 0.7–4.0)
MCH: 23.5 pg — ABNORMAL LOW (ref 26.0–34.0)
MCHC: 29.7 g/dL — ABNORMAL LOW (ref 30.0–36.0)
MCV: 79.2 fL — ABNORMAL LOW (ref 80.0–100.0)
Monocytes Absolute: 0.5 K/uL (ref 0.1–1.0)
Monocytes Relative: 11 %
Neutro Abs: 3.6 K/uL (ref 1.7–7.7)
Neutrophils Relative %: 78 %
Platelet Count: 202 K/uL (ref 150–400)
RBC: 4.13 MIL/uL (ref 3.87–5.11)
RDW: 20.2 % — ABNORMAL HIGH (ref 11.5–15.5)
WBC Count: 4.7 K/uL (ref 4.0–10.5)
nRBC: 0 % (ref 0.0–0.2)

## 2023-11-24 LAB — TOTAL PROTEIN, URINE DIPSTICK: Protein, ur: NEGATIVE mg/dL

## 2023-11-24 MED ORDER — SODIUM CHLORIDE 0.9 % IV SOLN: Freq: Once | INTRAVENOUS | Status: AC

## 2023-11-24 MED ORDER — EPOETIN ALFA-EPBX 40000 UNIT/ML IJ SOLN
40000.0000 [IU] | Freq: Once | INTRAMUSCULAR | Status: AC
  Administered 2023-11-24: 40000 [IU] via SUBCUTANEOUS
  Filled 2023-11-24: qty 1

## 2023-11-24 MED ORDER — SODIUM CHLORIDE 0.9 % IV SOLN
5.0000 mg/kg | Freq: Once | INTRAVENOUS | Status: AC
  Administered 2023-11-24: 300 mg via INTRAVENOUS
  Filled 2023-11-24: qty 12

## 2023-11-24 NOTE — Patient Instructions (Signed)
 [facility] - A DEPT OF [facility]  Discharge Instructions: Thank you for choosing [facility] to provide your oncology and hematology care.   If you have a lab appointment with the Cancer Center, please go directly to the Cancer Center and check in at the registration area.   Wear comfortable clothing and clothing appropriate for easy access to any Portacath or PICC line.   We strive to give you quality time with your provider. You may need to reschedule your appointment if you arrive late (15 or more minutes).  Arriving late affects you and other patients whose appointments are after yours.  Also, if you miss three or more appointments without notifying the office, you may be dismissed from the clinic at the provider's discretion.      For prescription refill requests, have your pharmacy contact our office and allow 72 hours for refills to be completed.    Today you received the following chemotherapy and/or immunotherapy agents [email or web address]        To help prevent nausea and vomiting after your treatment, we encourage you to take your nausea medication as directed.  BELOW ARE SYMPTOMS THAT SHOULD BE REPORTED IMMEDIATELY: *FEVER GREATER THAN 100.4 F (38 C) OR HIGHER *CHILLS OR SWEATING *NAUSEA AND VOMITING THAT IS NOT CONTROLLED WITH YOUR NAUSEA MEDICATION *UNUSUAL SHORTNESS OF BREATH *UNUSUAL BRUISING OR BLEEDING *URINARY PROBLEMS (pain or burning when urinating, or frequent urination) *BOWEL PROBLEMS (unusual diarrhea, constipation, pain near the anus) TENDERNESS IN MOUTH AND THROAT WITH OR WITHOUT PRESENCE OF ULCERS (sore throat, sores in mouth, or a toothache) UNUSUAL RASH, SWELLING OR PAIN  UNUSUAL VAGINAL DISCHARGE OR ITCHING   Items with * indicate a potential emergency and should be followed up as soon as possible or go to the Emergency Department if any problems should occur.  Please show the CHEMOTHERAPY ALERT CARD or IMMUNOTHERAPY  ALERT CARD at check-in to the Emergency Department and triage nurse.  Should you have questions after your visit or need to cancel or reschedule your appointment, please contact [facility] - A DEPT OF [facility]  Dept: [phone]  and follow the prompts.  Office hours are 8:00 a.m. to 4:30 p.m. Monday - Friday. Please note that voicemails left after 4:00 p.m. may not be returned until the following business day.  We are closed weekends and major holidays. You have access to a nurse at all times for urgent questions. Please call the main number to the clinic Dept: [phone] and follow the prompts.   For any non-urgent questions, you may also contact your provider using MyChart. We now offer e-Visits for anyone 33 and older to request care online for non-urgent symptoms. For details visit [email or web address].   Also download the MyChart app! Go to the app store, search MyChart, open the app, select [place], and log in with your MyChart username and password.

## 2023-11-25 ENCOUNTER — Telehealth: Payer: Self-pay | Admitting: Family

## 2023-11-25 ENCOUNTER — Encounter: Attending: Physician Assistant | Admitting: Physician Assistant

## 2023-11-25 DIAGNOSIS — I5042 Chronic combined systolic (congestive) and diastolic (congestive) heart failure: Secondary | ICD-10-CM | POA: Insufficient documentation

## 2023-11-25 DIAGNOSIS — I87332 Chronic venous hypertension (idiopathic) with ulcer and inflammation of left lower extremity: Secondary | ICD-10-CM | POA: Diagnosis not present

## 2023-11-25 DIAGNOSIS — C253 Malignant neoplasm of pancreatic duct: Secondary | ICD-10-CM | POA: Insufficient documentation

## 2023-11-25 DIAGNOSIS — Z950 Presence of cardiac pacemaker: Secondary | ICD-10-CM | POA: Diagnosis not present

## 2023-11-25 DIAGNOSIS — L97522 Non-pressure chronic ulcer of other part of left foot with fat layer exposed: Secondary | ICD-10-CM | POA: Insufficient documentation

## 2023-11-25 DIAGNOSIS — I7389 Other specified peripheral vascular diseases: Secondary | ICD-10-CM | POA: Diagnosis not present

## 2023-11-25 DIAGNOSIS — L97822 Non-pressure chronic ulcer of other part of left lower leg with fat layer exposed: Secondary | ICD-10-CM | POA: Diagnosis not present

## 2023-11-25 DIAGNOSIS — N184 Chronic kidney disease, stage 4 (severe): Secondary | ICD-10-CM | POA: Insufficient documentation

## 2023-11-25 DIAGNOSIS — I890 Lymphedema, not elsewhere classified: Secondary | ICD-10-CM | POA: Insufficient documentation

## 2023-11-25 LAB — FERRITIN: Ferritin: 24 ng/mL (ref 11–307)

## 2023-11-25 NOTE — Telephone Encounter (Signed)
 Called to confirm/remind patient of their appointment at the [facility] on 11/26/23.   Appointment:   [] Confirmed  [] Left mess   [] No answer/No voice mail  [x] VM Full/unable to leave message  [] Phone not in service  Patient reminded to bring all medications and/or complete list.  Confirmed patient has transportation. Gave directions, instructed to utilize valet parking.

## 2023-11-25 NOTE — Progress Notes (Unsigned)
 [facility]ED [facility] CLINIC NOTE    ID:  [name], DOB 1933/06/11, MRN [id number]    PCP:  [name], MD (last seen 10/24) Cardiologist:  Elspeth Sage, MD (last seen 11/24) Primary HF: Dr. Rolan (last seen 12/24)  Chief Complaint: CHF   History of Present Illness:  89 y.o. with history of permanent atrial fibrillation, chronic diastolic CHF, CKD stage 3, smoking/prior COPD, renal cell CA s/p right nephrectomy. She has a prior history of HFrEF with nonischemic cardiomyopathy and had a Medtronic ICD placed. However, subsequently her EF has recovered.    06/20, she had left hip ORIF after mechanical fall.  Echo in 06/20 showed EF 60-65%, mild RV dilation with normal systolic function, PASP 93 mmHg.  After she got home from the hospital stay post-ORIF, she noted marked peripheral edema. She was short of breath walking short distances.  +orthopnea. She came back to the hospital 07/20 and was admitted with volume overload. She was diuresed aggressively and lost about 22 lbs. RHC showed pulmonary venous hypertension. PCWP tracing had prominent v-waves in absence of significant MR, suggesting significant diastolic dysfunction. Noted to have significant Fe deficiency anemia, but FOBT was negative during 7/20 hospitalization.   Admitted 11/20 with symptomatic anemia, GI workup concerning for ischemic bowel. 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 Melvenia with VVS and conservative management recommended. Capsule endoscopy showed no definite source of bleeding. She was volume overloaded due to significant RV dysfunction and diuresed, she also developed AKI on CKD stage 3.      10/22, she had bilateral CIA stents placed. ABIs in 11/22 showed patent stents. She had a skin cancer removed from her left leg. 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   Echo 2/23 EF 65-70% with mild LVH, D-shaped septum with moderate RV enlargement, and mildly  decreased RV function, PASP 92, moderate-severe TR, dilated IVC, moderate aortic stenosis. Echo in 10/23 showed EF 60-65%, D-shaped septum, mild RV enlargement with normal RV systolic function, PASP 90, moderate-severe TR, mild AS, IVC dilated.   Patient was found to have a neuroendocrine tumor of the pancreatic head in the fall of 2023.  She has been treated with sandostatin  monthly.  She also had further GI bleeding from small bowel AVMs in fall 2023. She also had COVID-19 in 10/23 and has been on home oxygen  2L since that time.   Follow up with Dr. Sage, long discussion about EOL. Given DNR form and high-voltage therapies inactivated on ICD.  Admit 07/24 with acute on chronic blood loss anemia 2/2 GI bleed. Transfused and given IV iron . Had small bowel enteroscopy which showed 2 nonbleeding angiodysplastic lesions in duodenum and 3 nonbleeding angioplastic lesions in jejunum treated with APC.  Admitted early 09/24 with acute on chronic anemia suspected 2/2 chronic GI blood loss. Hgb down to 6.8. Received 2 u RBCs. GI did not recommend any endoscopic procedures d/t advanced age and comorbidities. Course further c/b acute on chronic CHF. She was diuresed with IV lasix  and metolazone . Echo during admit with LVEF 65-70%, D-shaped septum, mildly reduced RV, RVSP 103 mmHg, biatrial enlargement, severe TR, moderate mitral stenosis with mean gradient of 6 mmHg, dilated IVC.  She was seen back in the [facility] on 01/30/23 for post hospital f/u and noted recurrent melena, SOB and fatigued w/ minimal exertion. Also noted to have recurrent volume overload w/ marked return of LEE. Hgb was down to 6.6. She was sent  back to the ED and readmitted again for a/c CHF and GIB, treated w/ blood transfusions, IV Fe and diuretics. Discharged on 02/06/23 on torsemide  100 mg bid.   S/p 2u RBCs 04/26/23 for symptomatic anemia. Hgb 6.8.   Admitted 05/27/23 due to shortness of breath/ leg swelling. Initially given IV lasix   with  removal of ~ 4Land transitioned to oral torsemide  at 100mg  BID at discharge. Metolazone  decreased to weekly. Treated with antibiotics for leg cellulitis. Given 1 unit PRBC's due to hemoglobin trending down to 7.1. Wound care consulted due to weeping area on left shin. Palliative care also consulted.   She was admitted in 2/25 with lower leg cellulitis.  She was treated with IV abx. She was admitted again in 4/25 with lower leg cellulitis.  She was admitted in 5/25 with GI bleeding/anemia.   Echo in 5/25 showed EF 60-65%, mild LVH, d-shaped interventricular septum, severe RV enlargement with moderately decreased RV systolic function, PASP 96 mmHg, severe TR, mild MR with moderate MS (mean gradient 5 mmHg), moderate AS, IVC dilated.   She returns today for followup of CHF.  Lower legs are more red and weeping again with odor that concerns her for infection. She has an appointment with wound clinic on Monday.  She does not walk much, no dyspnea walking short distances but gets fatigued and dyspneic if she walks more than about 30-40 feet. No further BRBPR/melena. No palpitations.  No lightheadedness/syncope.   Labs (10/23): magnesium  1.7 Labs (3/24): TSH 2.93 Labs (4/24): K 3.9, creatinine 1.69, ALT 9, hgb 10, Plt 175 Labs (8/24): K 4.5, creatinine, 1.6 Labs (10/24): K 4.1, creatinine 1.55, Hgb 7.6 Labs (11/24): K 4.6, SCr 1.52 Labs (11/24): K 4.6, SCr 1.82 => 1.78, TSat 3, ferritin 16 Labs (12/24): K 3.9, creatinine 1.83 Labs (01/25): K 4.2, creatinine 1.89 Labs (2/25): hgb 8.5, BNP 517, K 3.5, creatinine 1.86 Labs (3/25): K 4, creatinine 2.13, hgb 7.8 Labs (5/25): K 3.5, creatinine 1.71 => 2.39  PMH: 1. COPD 2. Renal cell carcinoma: S/p right kidney resection.  3. CKD stage 3 4. Fe deficiency anemia 5. Atrial fibrillation: Permanent. She has failed amiodarone , Tikosyn , and flecainide  as well as multiple cardioversions. 6. Chronic diastolic CHF: She has history of prior HFrEF with  nonischemic cardiomyopathy.  She has a Medtronic ICD.   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  - Echo (6/20): EF 60-65%, mildly dilated RV with PASP 93 mmHg, mild-moderate TR.  - RHC (7/20): mean RA 11, PA 64/18 mean 39, mean PCWP 24 with v waves to 41, CI 4.21, PVR 1.9 WU.  - Cardiomems placement - RHC (11/20): mean RA 13, PA 63/23 mean 25 with v waves to 47 (MR only mild by echo), mean PCWP 25, CI 3.18, PVR 2.6 WU - Echo (11/21): EF 65-70%, moderate LVH, D-shaped septum with mildly decreased RV systolic function, RVSP 85.9 mmHg, severe RAE, severe TR, mod-sev AS. - Echo (2/23): EF 65-70% with mild LVH, D-shaped septum with moderate RV enlargement, and mildly decreased RV function, PASP 92, moderate-severe TR, dilated IVC, moderate aortic stenosis.  - Echo (10/23): EF 60-65%, D-shaped septum, mild RV enlargement with normal RV systolic function, PASP 90, moderate-severe TR, mild AS, IVC dilated.  - Echo (9/24): LVEF 65-70%, D-shaped septum, mildly reduced RV, RVSP 103 mmHg, biatrial enlargement, severe TR, moderate mitral stenosis with mean gradient of 6 mmHg, dilated IVC - Echo (01/25): EF 65-70%, mild LVH, RV moderately reduced, severely elevated PA pressure 83.9 mmHg, mild MR,  moderate MS with mean gradient of 6.0 mmHg, IVC dialted - Echo (5/25): EF 60-65%, mild LVH, d-shaped interventricular septum, severe RV enlargement with moderately decreased RV systolic function, PASP 96 mmHg, severe TR, mild MR with moderate MS (mean gradient 5 mmHg), moderate AS, IVC dilated.  7. Hyperlipidemia 8. Carotid stenosis: Carotid dopplers (3/19) with 40-59% LICA stenosis.   9. Possible ischemic bowel in 11/20 10. 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 Conservative management per Dr. Melvenia.  - 10/22 bilateral CIA stents.  - 11/22 ABIs: 1.07 left, 0.83 right with patent CIA stents.  11. 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  12. Squamous cell  skin cancer.  13. Neuroendocrine tumor of pancreatic head: Treating with sandostatin .  14. COVID 19 in 10/23 15. Tricuspid regurgitation: Severe on 5/25 echo.  16. Aortic stenosis: Moderate on 5/25 echo.  17. Mitral stenosis: Moderate (mean gradient 5 mmHg) on 5/25 echo.     Current Outpatient Medications on File Prior to Visit  Medication Sig Dispense Refill   acetaminophen  (TYLENOL ) 500 MG tablet Take 1,000 mg by mouth every 8 (eight) hours.     Ascorbic Acid  (VITAMIN C  PO) Take 1 tablet by mouth daily.     Cholecalciferol  (VITAMIN D -3 PO) Take 2 capsules by mouth daily.     clotrimazole -betamethasone  (LOTRISONE ) cream Apply topically 2 (two) times daily. 30 g 0   CRANBERRY PO Take 1 tablet by mouth daily.     Cyanocobalamin  (B-12 PO) Take 1 tablet by mouth daily.     ferrous gluconate  (FERGON) 324 MG tablet Take 1 tablet (324 mg total) by mouth daily with breakfast. 30 tablet 3   fluticasone  (FLONASE ) 50 MCG/ACT nasal spray Place 2 sprays into both nostrils daily as needed for allergies or rhinitis. 16 g 0   methocarbamol  (ROBAXIN ) 500 MG tablet Take 1 tablet (500 mg total) by mouth every 8 (eight) hours as needed for muscle spasms. 30 tablet 0   metolazone  (ZAROXOLYN ) 2.5 MG tablet Take 1 tablet (2.5 mg total) by mouth 3 (three) times a week. Every Tues., Thurs and Sat. with 40 meq of potassium 90 tablet 3   Multiple Vitamins-Minerals (HAIR SKIN NAILS PO) Take 1 capsule by mouth daily.     pantoprazole  (PROTONIX ) 40 MG tablet Take 1 tablet (40 mg total) by mouth daily. 30 tablet 3   Potassium Chloride  ER 20 MEQ TBCR Take 1 tablet (20 mEq total) by mouth daily. 30 tablet 2   rosuvastatin  (CRESTOR ) 10 MG tablet Take 1 tablet (10 mg total) by mouth daily. 30 tablet 0   torsemide  (DEMADEX ) 20 MG tablet Take 5 tablets (100 mg total) by mouth 2 (two) times daily. 250 tablet 3   traMADol  (ULTRAM ) 50 MG tablet Take 1 tablet (50 mg total) by mouth 2 (two) times daily as needed. 30 tablet 0   No  current facility-administered medications on file prior to visit.     Allergies:   Dilaudid  [hydromorphone ], Fentanyl , Codeine, Morphine  and related, Amoxicillin , Hydrocodone , Oxycodone , Tramadol , Whole blood, and Sulfonamide derivatives    Social History:  The patient  reports that she quit smoking about 21 years ago. Her smoking use included cigarettes. She has a 20.00 pack-year smoking history. She has never used smokeless tobacco. She reports that she does not drink alcohol and does not use drugs.    Family History:  The patient's family history includes Breast cancer in her cousin and another family member; Breast  cancer (age of onset: 34) in her maternal aunt; Early death in her father; Heart failure in her mother.    ROS:  Please see the history of present illness.   All other systems are personally reviewed and negative.    There were no vitals filed for this visit.  Wt Readings from Last 3 Encounters:  11/10/23 134 lb (60.8 kg)  11/06/23 129 lb (58.5 kg)  10/01/23 129 lb 4.8 oz (58.7 kg)   Lab Results  Component Value Date   CREATININE 2.39 (H) 10/01/2023   CREATININE 1.71 (H) 09/17/2023   CREATININE 1.65 (H) 09/16/2023    PHYSICAL EXAM: General: NAD Neck: JVP 8-9 cm, no thyromegaly or thyroid  nodule.  Lungs: Clear to auscultation bilaterally with normal respiratory effort. CV: Nondisplaced PMI.  Heart regular S1/S2, no S3/S4, 3/6 systolic murmur along the sternal border.  2+ chronic edema to knees with ulcerations and weeping.  No carotid bruit.  Unable to palpate pedal pulses.  Abdomen: Soft, nontender, no hepatosplenomegaly, no distention.  Skin: Erythema and weeping lower legs bilaterally.   Neurologic: Alert and oriented x 3.  Psych: Normal affect. Extremities: No clubbing or cyanosis.  HEENT: Normal.   ASSESSMENT AND PLAN:  1. Chronic diastolic CHF: With prominent RV failure. She had remote nonischemic cardiomyopathy with recovery of EF, has Medtronic ICD. Atrial  fibrillation likely contributes, but this appears to be permanent now as she has failed multiple anti-arrhythmics and is reasonably rate-controlled. RHC 11/20 showed elevated filling pressures and pulmonary venous hypertension. Prominent v-waves on PCWP tracing likely due to stiff ventricle/diastolic dysfunction as she had only mild MR on echo. She is RV pacing at a high percentage, but LV EF has remained normal. Most recent echo in 5/25 showed EF 60-65%, mild LVH, d-shaped interventricular septum, severe RV enlargement with moderately decreased RV systolic function, PASP 96 mmHg, severe TR, mild MR with moderate MS (mean gradient 5 mmHg), moderate AS, IVC dilated. NYHA class 3 to 3b, worse when she is anemic. She does not look markedly volume overloaded today, only mild JVD in setting of severe TR.  Lower extremity edema is chronic.   - Continue metolazone  2.5 mg three times weekly.  - Continue torsemide  100 mg bid and KCl 20 daily (40 on metolazone  days).  She had BMET this week.  - Off SGLT2i after UTIs 2. Hypertension: BP not elevated.  3. Pulmonary hypertension:  Based on RHC 11/20, she has primarily pulmonary venous hypertension, not candidate for pulmonary vasodilators.  4. CKD: Stage 4. Off SGLT2i.  Most recent creatinine 2.39.  5. Chronic anemia/GI bleeding: Due to small bowel AVMs. She has had transfusions and IV Fe, she was most recently admitted for this in 5/25.  - She is now off anticoagulation.  6. Atrial fibrillation: Permanent. Rate control is reasonable off nodal blockers. She has failed amiodarone , Tikosyn , and flecainide  as well as multiple cardioversions.  - Off Eliquis  with recurrent GI bleeding and transfusions.  - Not Watchman candidate due to frailty and poorly compensated CHF.  7. Neuroendocrine tumor of pancreatic head - Ongoing treatment per oncology.  8. Tricuspid regurgitation: Severe on 5/25 echo.   9. Mitral stenosis: Moderate on 5/25 echo.  - Follow, not surgical  candidate.  10. Aortic stenosis: Moderate on 5/25 echo.  11. Cellulitis: I worry that she has developed a recurrent lower extremity cellulitis.   - I will start her on doxycycline .   - She has followup in wound clinic Monday.   Patient is  very frail with multiple comorbidities.  Goals of care discussions are appropriate, so far she has wanted to remain full code and is not ready for hospice.  I worry that she will not stay out of the hospital for long.   Followup in 2 months with APP.   I spent 31 minutes reviewing records, interviewing/examining patient, and managing orders.    [name] Class, 11/25/23

## 2023-11-26 ENCOUNTER — Ambulatory Visit: Attending: Family | Admitting: Family

## 2023-11-26 ENCOUNTER — Encounter: Payer: Self-pay | Admitting: Family

## 2023-11-26 ENCOUNTER — Other Ambulatory Visit: Payer: Self-pay

## 2023-11-26 VITALS — BP 132/45 | HR 63 | Wt 132.6 lb

## 2023-11-26 DIAGNOSIS — J449 Chronic obstructive pulmonary disease, unspecified: Secondary | ICD-10-CM | POA: Diagnosis not present

## 2023-11-26 DIAGNOSIS — Z79899 Other long term (current) drug therapy: Secondary | ICD-10-CM | POA: Insufficient documentation

## 2023-11-26 DIAGNOSIS — D3A8 Other benign neuroendocrine tumors: Secondary | ICD-10-CM

## 2023-11-26 DIAGNOSIS — I10 Essential (primary) hypertension: Secondary | ICD-10-CM

## 2023-11-26 DIAGNOSIS — Z905 Acquired absence of kidney: Secondary | ICD-10-CM | POA: Diagnosis not present

## 2023-11-26 DIAGNOSIS — I4821 Permanent atrial fibrillation: Secondary | ICD-10-CM | POA: Diagnosis not present

## 2023-11-26 DIAGNOSIS — R5383 Other fatigue: Secondary | ICD-10-CM | POA: Insufficient documentation

## 2023-11-26 DIAGNOSIS — I5032 Chronic diastolic (congestive) heart failure: Secondary | ICD-10-CM | POA: Diagnosis not present

## 2023-11-26 DIAGNOSIS — I272 Pulmonary hypertension, unspecified: Secondary | ICD-10-CM | POA: Diagnosis not present

## 2023-11-26 DIAGNOSIS — Z85528 Personal history of other malignant neoplasm of kidney: Secondary | ICD-10-CM | POA: Diagnosis not present

## 2023-11-26 DIAGNOSIS — Z87891 Personal history of nicotine dependence: Secondary | ICD-10-CM | POA: Insufficient documentation

## 2023-11-26 DIAGNOSIS — D638 Anemia in other chronic diseases classified elsewhere: Secondary | ICD-10-CM | POA: Insufficient documentation

## 2023-11-26 DIAGNOSIS — I079 Rheumatic tricuspid valve disease, unspecified: Secondary | ICD-10-CM | POA: Diagnosis not present

## 2023-11-26 DIAGNOSIS — K5520 Angiodysplasia of colon without hemorrhage: Secondary | ICD-10-CM | POA: Diagnosis not present

## 2023-11-26 DIAGNOSIS — I083 Combined rheumatic disorders of mitral, aortic and tricuspid valves: Secondary | ICD-10-CM | POA: Insufficient documentation

## 2023-11-26 DIAGNOSIS — N184 Chronic kidney disease, stage 4 (severe): Secondary | ICD-10-CM | POA: Insufficient documentation

## 2023-11-26 DIAGNOSIS — R600 Localized edema: Secondary | ICD-10-CM | POA: Diagnosis not present

## 2023-11-26 DIAGNOSIS — S81802D Unspecified open wound, left lower leg, subsequent encounter: Secondary | ICD-10-CM

## 2023-11-26 DIAGNOSIS — M79605 Pain in left leg: Secondary | ICD-10-CM | POA: Insufficient documentation

## 2023-11-26 DIAGNOSIS — Z7984 Long term (current) use of oral hypoglycemic drugs: Secondary | ICD-10-CM | POA: Insufficient documentation

## 2023-11-26 DIAGNOSIS — Z95810 Presence of automatic (implantable) cardiac defibrillator: Secondary | ICD-10-CM | POA: Diagnosis not present

## 2023-11-26 DIAGNOSIS — I130 Hypertensive heart and chronic kidney disease with heart failure and stage 1 through stage 4 chronic kidney disease, or unspecified chronic kidney disease: Secondary | ICD-10-CM | POA: Insufficient documentation

## 2023-11-26 DIAGNOSIS — C7A8 Other malignant neuroendocrine tumors: Secondary | ICD-10-CM | POA: Insufficient documentation

## 2023-11-26 DIAGNOSIS — D631 Anemia in chronic kidney disease: Secondary | ICD-10-CM

## 2023-11-26 DIAGNOSIS — R0602 Shortness of breath: Secondary | ICD-10-CM | POA: Diagnosis present

## 2023-11-26 NOTE — Patient Instructions (Addendum)
 It was good to see you today!  Medication Changes:  No medication changes today!  Lab Work:  Go DOWN to LOWER LEVEL (LL) to have your blood work completed inside of [employer] office.  We will only call you if the results are abnormal or if the provider would like to make medication changes.   Follow-Up in: Please follow up with the [facility] in 2 months with Ellouise Class, FNP.  At the [facility], you and your health needs are our priority. We have a designated team specialized in the treatment of Heart Failure. This Care Team includes your primary Heart Failure Specialized Cardiologist (physician), Advanced Practice Providers (APPs- Physician Assistants and Nurse Practitioners), and Pharmacist who all work together to provide you with the care you need, when you need it.   You may see any of the following providers on your designated Care Team at your next follow up:  Dr. Toribio Fuel Dr. Ezra Shuck Dr. Ria Commander Dr. Odis Brownie Ellouise Class, FNP Jaun Bash, RPH-CPP  Please be sure to bring in all your medications bottles to every appointment.   Need to Contact Us :  If you have any questions or concerns before your next appointment please send us  a message through [facility] or call our office at [phone].    TO LEAVE A MESSAGE FOR THE NURSE SELECT OPTION 2, PLEASE LEAVE A MESSAGE INCLUDING: YOUR NAME DATE OF BIRTH CALL BACK NUMBER REASON FOR CALL**this is important as we prioritize the call backs  YOU WILL RECEIVE A CALL BACK THE SAME DAY AS LONG AS YOU CALL BEFORE 4:00 PM

## 2023-11-27 ENCOUNTER — Encounter

## 2023-11-27 ENCOUNTER — Encounter: Payer: Self-pay | Admitting: Hematology

## 2023-11-27 ENCOUNTER — Ambulatory Visit: Payer: Self-pay | Admitting: Family

## 2023-11-27 LAB — BASIC METABOLIC PANEL WITH GFR
BUN/Creatinine Ratio: 33 — ABNORMAL HIGH (ref 12–28)
BUN: 56 mg/dL — ABNORMAL HIGH (ref 8–27)
CO2: 25 mmol/L (ref 20–29)
Calcium: 9.2 mg/dL (ref 8.7–10.3)
Chloride: 97 mmol/L (ref 96–106)
Creatinine, Ser: 1.71 mg/dL — ABNORMAL HIGH (ref 0.57–1.00)
Glucose: 75 mg/dL (ref 70–99)
Potassium: 4.6 mmol/L (ref 3.5–5.2)
Sodium: 139 mmol/L (ref 134–144)
eGFR: 29 mL/min/1.73 — ABNORMAL LOW (ref >59)

## 2023-12-01 ENCOUNTER — Ambulatory Visit (INDEPENDENT_AMBULATORY_CARE_PROVIDER_SITE_OTHER): Admitting: Family Medicine

## 2023-12-01 ENCOUNTER — Encounter: Payer: Self-pay | Admitting: Family Medicine

## 2023-12-01 VITALS — BP 110/62 | HR 69 | Temp 97.7°F | Ht 66.0 in | Wt 131.0 lb

## 2023-12-01 DIAGNOSIS — L03116 Cellulitis of left lower limb: Secondary | ICD-10-CM | POA: Diagnosis not present

## 2023-12-01 DIAGNOSIS — L97929 Non-pressure chronic ulcer of unspecified part of left lower leg with unspecified severity: Secondary | ICD-10-CM

## 2023-12-01 DIAGNOSIS — I739 Peripheral vascular disease, unspecified: Secondary | ICD-10-CM | POA: Diagnosis not present

## 2023-12-01 DIAGNOSIS — K5520 Angiodysplasia of colon without hemorrhage: Secondary | ICD-10-CM | POA: Diagnosis not present

## 2023-12-01 DIAGNOSIS — R600 Localized edema: Secondary | ICD-10-CM

## 2023-12-01 DIAGNOSIS — D3A8 Other benign neuroendocrine tumors: Secondary | ICD-10-CM | POA: Diagnosis not present

## 2023-12-01 DIAGNOSIS — I4821 Permanent atrial fibrillation: Secondary | ICD-10-CM

## 2023-12-01 DIAGNOSIS — I5032 Chronic diastolic (congestive) heart failure: Secondary | ICD-10-CM | POA: Diagnosis not present

## 2023-12-01 DIAGNOSIS — J9611 Chronic respiratory failure with hypoxia: Secondary | ICD-10-CM

## 2023-12-01 DIAGNOSIS — I83029 Varicose veins of left lower extremity with ulcer of unspecified site: Secondary | ICD-10-CM | POA: Diagnosis not present

## 2023-12-01 DIAGNOSIS — I83892 Varicose veins of left lower extremities with other complications: Secondary | ICD-10-CM

## 2023-12-01 DIAGNOSIS — D500 Iron deficiency anemia secondary to blood loss (chronic): Secondary | ICD-10-CM | POA: Diagnosis not present

## 2023-12-01 DIAGNOSIS — Z9981 Dependence on supplemental oxygen: Secondary | ICD-10-CM

## 2023-12-01 MED ORDER — GABAPENTIN 100 MG PO CAPS: 100.0000 mg | ORAL_CAPSULE | Freq: Every day | ORAL | 1 refills | Status: DC

## 2023-12-01 MED ORDER — TRAMADOL HCL 50 MG PO TABS
50.0000 mg | ORAL_TABLET | Freq: Two times a day (BID) | ORAL | 0 refills | Status: DC | PRN
Start: 2023-12-01 — End: 2023-12-19

## 2023-12-01 NOTE — Patient Instructions (Addendum)
 Ambulatory pulse ox today.  Work on a source of protein with every meal, try 1/2-1 can of ensure 30 min before lunch.  Try gabapentin  100mg  at night time. We will remain on lowest dose.  Good to see you today.

## 2023-12-01 NOTE — Progress Notes (Unsigned)
 Ph: [phone] Fax: [phone]   Patient ID: [name], female    DOB: February 17, 1934, 89 y.o.   MRN: [id number]  This visit was conducted in person.  BP 110/62   Pulse 69   Temp 97.7 F (36.5 C) (Oral)   Ht 5' 6 (1.676 m)   Wt 131 lb (59.4 kg)   SpO2 98%   BMI 21.14 kg/m    CC: follow up visit , updated script for POC Subjective:   HPI: [name] is a 89 y.o. female presenting on 12/01/2023 for Medical Management of Chronic Issues (Needs order for adapt for oxygen  and refill on Tramadol  /In office with Nathanel Daughter )   Known chronic diastolic congestive heart failure with right ventricular failure, s/p PPM / AICD, pulmonary hypertension, permanent atrial fibrillation not anticoagulated due to chronic GI bleeding/AVMs, chronic anemia with antibody/history of transfusion reactions, COPD / interstitial lung disease, chronic hypoxic respiratory failure on 2 L of oxygen  via nasal cannula, pancreatic neuroendocrine cancer managed with octreotide  infusions since 03/2022 most recently started bevacizumab  (VEGF inhibitor), kidney disease stage IIIb, renal cell cancer status post right nephrectomy, history of TIA.   Chronic cellulitis seeing wound clinic q2 wks. Most recently prescribed cephalosporin 300mg  bid BID 14d course (liquid).  Not a candidate for further interventions as she would have to stop infusion for 6 months.  Thought component of neuropathy - previous trouble tolerating gabapentin .   Uses portable oxygen  concentrator 2-3 L via [place].   Latest A1c 9.7 - continues Q weekly infusion about to space out to q2 wks. Continues bevacizumab  Q2 wks and sandosatatin/ retacrit  injections monthly.      Relevant past medical, surgical, family and social history reviewed and updated as indicated. Interim medical history since our last visit reviewed. Allergies and medications reviewed and updated. Outpatient Medications Prior to Visit  Medication Sig Dispense Refill    acetaminophen  (TYLENOL ) 500 MG tablet Take 1,000 mg by mouth every 8 (eight) hours.     Ascorbic Acid  (VITAMIN C  PO) Take 1 tablet by mouth daily.     Cholecalciferol  (VITAMIN D -3 PO) Take 2 capsules by mouth daily.     clotrimazole -betamethasone  (LOTRISONE ) cream Apply topically 2 (two) times daily. 30 g 0   CRANBERRY PO Take 1 tablet by mouth daily.     Cyanocobalamin  (B-12 PO) Take 1 tablet by mouth daily.     ferrous gluconate  (FERGON) 324 MG tablet Take 1 tablet (324 mg total) by mouth daily with breakfast. 30 tablet 3   fluticasone  (FLONASE ) 50 MCG/ACT nasal spray Place 2 sprays into both nostrils daily as needed for allergies or rhinitis. 16 g 0   metolazone  (ZAROXOLYN ) 2.5 MG tablet Take 1 tablet (2.5 mg total) by mouth 3 (three) times a week. Every Tues., Thurs and Sat. with 40 meq of potassium 90 tablet 3   Multiple Vitamins-Minerals (HAIR SKIN NAILS PO) Take 1 capsule by mouth daily.     pantoprazole  (PROTONIX ) 40 MG tablet Take 1 tablet (40 mg total) by mouth daily. 30 tablet 3   Potassium Chloride  ER 20 MEQ TBCR Take 1 tablet (20 mEq total) by mouth daily. 30 tablet 2   SANTYL 250 UNIT/GM ointment Apply topically daily.     torsemide  (DEMADEX ) 20 MG tablet Take 5 tablets (100 mg total) by mouth 2 (two) times daily. 250 tablet 3   traMADol  (ULTRAM ) 50 MG tablet Take 1 tablet (50 mg total) by mouth 2 (two) times daily as needed. 30  tablet 0   rosuvastatin  (CRESTOR ) 10 MG tablet Take 1 tablet (10 mg total) by mouth daily. (Patient not taking: Reported on 12/01/2023) 30 tablet 0   methocarbamol  (ROBAXIN ) 500 MG tablet Take 1 tablet (500 mg total) by mouth every 8 (eight) hours as needed for muscle spasms. (Patient not taking: Reported on 12/01/2023) 30 tablet 0   No facility-administered medications prior to visit.     Per HPI unless specifically indicated in ROS section below Review of Systems  Objective:  BP 110/62   Pulse 69   Temp 97.7 F (36.5 C) (Oral)   Ht 5' 6 (1.676  m)   Wt 131 lb (59.4 kg)   SpO2 98%   BMI 21.14 kg/m   Wt Readings from Last 3 Encounters:  12/01/23 131 lb (59.4 kg)  11/26/23 132 lb 9.6 oz (60.1 kg)  11/10/23 134 lb (60.8 kg)      Physical Exam Vitals and nursing note reviewed.  Constitutional:      Appearance: Normal appearance. She is not ill-appearing.     Comments: Sitting in wheelchair, frail appearing  Cardiovascular:     Rate and Rhythm: Normal rate and regular rhythm.     Pulses: Normal pulses.     Heart sounds: Normal heart sounds. No murmur heard. Pulmonary:     Effort: Pulmonary effort is normal. No respiratory distress.     Breath sounds: Normal breath sounds. No wheezing, rhonchi or rales.  Musculoskeletal:        General: Swelling and tenderness present.     Right lower leg: Edema present.     Left lower leg: Edema present.  Skin:    General: Skin is warm and dry.     Findings: Erythema and wound present.         Comments:  Poorly healing chronic wounds to L lower leg and L lateral foot Erythema to bilateral lower extremities  Neurological:     Mental Status: She is alert.  Psychiatric:        Mood and Affect: Mood normal.        Behavior: Behavior normal.       Results for orders placed or performed in visit on 11/26/23  Basic metabolic panel with GFR   Collection Time: 11/26/23  2:59 PM  Result Value Ref Range   Glucose 75 70 - 99 mg/dL   BUN 56 (H) 8 - 27 mg/dL   Creatinine, Ser 8.28 (H) 0.57 - 1.00 mg/dL   eGFR 29 (L) >40 fO/fpw/8.26   BUN/Creatinine Ratio 33 (H) 12 - 28   Sodium 139 134 - 144 mmol/L   Potassium 4.6 3.5 - 5.2 mmol/L   Chloride 97 96 - 106 mmol/L   CO2 25 20 - 29 mmol/L   Calcium  9.2 8.7 - 10.3 mg/dL   *Note: Due to a large number of results and/or encounters for the requested time period, some results have not been displayed. A complete set of results can be found in Results Review.   Lab Results  Component Value Date   WBC 4.7 11/24/2023   HGB 9.7 (L) 11/24/2023    HCT 32.7 (L) 11/24/2023   MCV 79.2 (L) 11/24/2023   PLT 202 11/24/2023    Lab Results  Component Value Date   ALT 6 10/01/2023   AST 15 10/01/2023   ALKPHOS 52 10/01/2023   BILITOT 0.6 10/01/2023   Assessment & Plan:   Problem List Items Addressed This Visit   None  Meds ordered this encounter  Medications   traMADol  (ULTRAM ) 50 MG tablet    Sig: Take 1 tablet (50 mg total) by mouth 2 (two) times daily as needed.    Dispense:  30 tablet    Refill:  0   gabapentin  (NEURONTIN ) 100 MG capsule    Sig: Take 1 capsule (100 mg total) by mouth at bedtime.    Dispense:  30 capsule    Refill:  1    No orders of the defined types were placed in this encounter.   Patient Instructions  Ambulatory pulse ox today.  Work on a source of protein with every meal, try 1/2-1 can of ensure 30 min before lunch.  Try gabapentin  100mg  at night time. We will remain on lowest dose.  Good to see you today.   Follow up plan: Return if symptoms worsen or fail to improve.  Anton Blas, MD

## 2023-12-02 ENCOUNTER — Ambulatory Visit (HOSPITAL_COMMUNITY): Admission: RE | Admit: 2023-12-02 | Source: Ambulatory Visit

## 2023-12-02 ENCOUNTER — Encounter: Payer: Self-pay | Admitting: Family Medicine

## 2023-12-02 ENCOUNTER — Encounter (HOSPITAL_COMMUNITY): Admitting: Vascular Surgery

## 2023-12-02 ENCOUNTER — Telehealth: Payer: Self-pay | Admitting: Family Medicine

## 2023-12-02 MED ORDER — LEVOFLOXACIN 250 MG PO TABS: ORAL_TABLET | ORAL | 0 refills | Status: AC

## 2023-12-02 NOTE — Assessment & Plan Note (Signed)
 Continue monthly sandostatin  through heme.

## 2023-12-02 NOTE — Assessment & Plan Note (Addendum)
 Chronic, currently on cefdinir  course. Concern for persistently chronically poorly healing wound despite this, anticipate component of PAD - has VVS f/u tomorrow. Limited options as she cannot tolerate debridement due to pain, chronic anemia treatment with bevacizumab  complicates wound treatment. Will add levaquin  to cover pseudomonas. Renally dose - latest eGFR 29.  Tramadol  refilled per pt request.  Reasonable to retry low dose gabapentin , caution as she has had trouble tolerating higher dose.

## 2023-12-02 NOTE — Assessment & Plan Note (Addendum)
 Followed by heme with Q2 wk bevacizumab  infusions, monthly sandostatin /retacrit  injections.  Latest Hgb actually improved to 9.7 (previously 8-9)

## 2023-12-02 NOTE — Assessment & Plan Note (Addendum)
 Most recently started on cefdinir  300mg  bid course by wound clinic last week.

## 2023-12-02 NOTE — Assessment & Plan Note (Addendum)
 Continue pantoprazole , bevacizumab  (VEGF inhibitor)  Off anticoagulant

## 2023-12-02 NOTE — Assessment & Plan Note (Signed)
 Off AC due to bleed risk.

## 2023-12-02 NOTE — Assessment & Plan Note (Addendum)
 Needs supplemental oxygen , uses POC for mobility.  Fails ambulatory pulse ox today.  Will reorder.   Ambulatory pulse ox on 2 L pulsating O2 SpO2 at start on room air :  98% SpO2 while ambulating:  87% SpO2 at recover:  95%

## 2023-12-02 NOTE — Telephone Encounter (Signed)
 Please notify - I'd like her to broaden antibiotic treatment and add 2nd antibiotic. I have sent levaquin  to her pharmacy - take 2 tablets on day one followed by one tablet daily for 6 days, total 7d course.

## 2023-12-02 NOTE — Telephone Encounter (Signed)
 Called patient reviewed all information and repeated back to me. Will call if any questions.  ? ?

## 2023-12-02 NOTE — Assessment & Plan Note (Addendum)
 Chronic pedal edema. Continue torsemide  100mg  bid with metolazone  3d/wk

## 2023-12-08 ENCOUNTER — Inpatient Hospital Stay

## 2023-12-08 ENCOUNTER — Encounter: Payer: Self-pay | Admitting: Hematology

## 2023-12-08 ENCOUNTER — Ambulatory Visit: Payer: PPO

## 2023-12-08 ENCOUNTER — Inpatient Hospital Stay: Attending: Nurse Practitioner

## 2023-12-08 ENCOUNTER — Inpatient Hospital Stay: Admitting: Hematology

## 2023-12-08 VITALS — BP 150/60 | HR 59 | Temp 98.5°F | Resp 16 | Ht 66.0 in

## 2023-12-08 DIAGNOSIS — D500 Iron deficiency anemia secondary to blood loss (chronic): Secondary | ICD-10-CM

## 2023-12-08 DIAGNOSIS — D3A8 Other benign neuroendocrine tumors: Secondary | ICD-10-CM | POA: Insufficient documentation

## 2023-12-08 DIAGNOSIS — Z5112 Encounter for antineoplastic immunotherapy: Secondary | ICD-10-CM | POA: Insufficient documentation

## 2023-12-08 DIAGNOSIS — K5520 Angiodysplasia of colon without hemorrhage: Secondary | ICD-10-CM

## 2023-12-08 DIAGNOSIS — Z79899 Other long term (current) drug therapy: Secondary | ICD-10-CM | POA: Insufficient documentation

## 2023-12-08 DIAGNOSIS — D649 Anemia, unspecified: Secondary | ICD-10-CM

## 2023-12-08 DIAGNOSIS — D631 Anemia in chronic kidney disease: Secondary | ICD-10-CM | POA: Insufficient documentation

## 2023-12-08 DIAGNOSIS — N184 Chronic kidney disease, stage 4 (severe): Secondary | ICD-10-CM | POA: Diagnosis not present

## 2023-12-08 LAB — CBC WITH DIFFERENTIAL (CANCER CENTER ONLY)
Abs Immature Granulocytes: 0.01 K/uL (ref 0.00–0.07)
Basophils Absolute: 0 K/uL (ref 0.0–0.1)
Basophils Relative: 1 %
Eosinophils Absolute: 0 K/uL (ref 0.0–0.5)
Eosinophils Relative: 0 %
HCT: 31.7 % — ABNORMAL LOW (ref 36.0–46.0)
Hemoglobin: 9.3 g/dL — ABNORMAL LOW (ref 12.0–15.0)
Immature Granulocytes: 0 %
Lymphocytes Relative: 9 %
Lymphs Abs: 0.4 K/uL — ABNORMAL LOW (ref 0.7–4.0)
MCH: 23.4 pg — ABNORMAL LOW (ref 26.0–34.0)
MCHC: 29.3 g/dL — ABNORMAL LOW (ref 30.0–36.0)
MCV: 79.6 fL — ABNORMAL LOW (ref 80.0–100.0)
Monocytes Absolute: 0.6 K/uL (ref 0.1–1.0)
Monocytes Relative: 12 %
Neutro Abs: 3.6 K/uL (ref 1.7–7.7)
Neutrophils Relative %: 78 %
Platelet Count: 194 K/uL (ref 150–400)
RBC: 3.98 MIL/uL (ref 3.87–5.11)
RDW: 19.3 % — ABNORMAL HIGH (ref 11.5–15.5)
WBC Count: 4.6 K/uL (ref 4.0–10.5)
nRBC: 0 % (ref 0.0–0.2)

## 2023-12-08 LAB — TOTAL PROTEIN, URINE DIPSTICK: Protein, ur: NEGATIVE mg/dL

## 2023-12-08 LAB — SAMPLE TO BLOOD BANK

## 2023-12-08 MED ORDER — HEPARIN SOD (PORK) LOCK FLUSH 100 UNIT/ML IV SOLN
500.0000 [IU] | Freq: Once | INTRAVENOUS | Status: DC | PRN
Start: 2023-12-08 — End: 2023-12-08

## 2023-12-08 MED ORDER — SODIUM CHLORIDE 0.9 % IV SOLN: Freq: Once | INTRAVENOUS | Status: AC

## 2023-12-08 MED ORDER — SODIUM CHLORIDE 0.9 % IV SOLN
5.0000 mg/kg | Freq: Once | INTRAVENOUS | Status: AC
  Administered 2023-12-08: 300 mg via INTRAVENOUS
  Filled 2023-12-08: qty 12

## 2023-12-08 MED ORDER — EPOETIN ALFA-EPBX 40000 UNIT/ML IJ SOLN
40000.0000 [IU] | Freq: Once | INTRAMUSCULAR | Status: AC
  Administered 2023-12-08: 40000 [IU] via SUBCUTANEOUS
  Filled 2023-12-08: qty 1

## 2023-12-08 MED ORDER — OCTREOTIDE ACETATE 20 MG IM KIT
20.0000 mg | PACK | Freq: Once | INTRAMUSCULAR | Status: AC
  Administered 2023-12-08: 20 mg via INTRAMUSCULAR
  Filled 2023-12-08: qty 1

## 2023-12-08 MED ORDER — SODIUM CHLORIDE 0.9% FLUSH: 10.0000 mL | INTRAVENOUS | Status: DC | PRN

## 2023-12-08 NOTE — Progress Notes (Signed)
 [facility]   [phone] [phone]   Clinic Follow up Note   Patient Care Team: [name], MD as PCP - General (Family Medicine) [name], MD as PCP - Advanced Heart Failure (Cardiology) [name], MD as PCP - Cardiology (Cardiology) [name], MD as PCP - Electrophysiology (Cardiology) [name], MD as Consulting Physician (Cardiology) [name], MD as Consulting Physician (Cardiology) [name], [name], MD as Consulting Physician (Vascular Surgery) [name], MD as Consulting Physician (Hematology and Oncology) [name], MD as Consulting Physician (Oncology) [name], [facility] (Inactive) as Pharmacist (Pharmacist)  Date of Service:  12/08/2023  CHIEF COMPLAINT: f/u of anemia and neuroendocrine tumor  CURRENT THERAPY:  Sandostatin  injection monthly Bevacizumab  for GI bleeding IV iron  as needed if ferritin less than 50 Blood transfusion as needed Retacrit  injection every 2 weeks  Oncology History   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   -Outpatient EUS 03/04/2022 by Dr. 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 -She began first line sandostatin  injections q28 days on 03/27/22.  -Dotatate PET scan, done 2 weeks after first injection 04/10/22, shows no significant tracer avidity in the pancrease or distant sites.  Findings are likely due to close timing relative to the sandostatin  injection blocking receptors. 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 -She previously had reaction to blood transfusion, or required multiple premedications for blood transfusion -She was recently hospitalized for anemia and a GI bleeding in July 2024. -She is currently on ESA every 2 weeks, IV iron  as needed, and blood transfusion as needed. -Due to her multiple hospital admission for anemia and GI bleeding, she started bevacizumab  for AVM on 02/12/2023. She has missed some treatments due to hospital admission etc.      Assessment and Plan Assessment & Plan Malignant neuroendocrine tumor Undergoing treatment with Avastin  to manage symptoms and prevent hospitalizations due to anemia exacerbations. Current regimen is every two weeks, transitioning to once a month after the fifth dose. - Administer Avastin  infusion on August 18th. - Schedule next Avastin  infusion for September 2nd.  Chronic anemia secondary to GI bleeding  Managed with Avastin  infusions to maintain hemoglobin levels and prevent hospitalizations. Current hemoglobin is 9.3 g/dL, well-managed without immediate need for transfusion. Last transfusion was in June. - Schedule blood transfusion for August 20th as a precautionary measure. - Monitor hemoglobin  levels every two weeks. - Schedule blood transfusion  for September 3rd if needed.  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 Concerns about proximity to bone and lack of antibiotic treatment from wound care clinic. Primary care provided antibiotics due to odor from wound.  Plan - Lab reviewed hemoglobin 9.3, she is clinically stable -Will proceed bevacizumab  today, and change to every 4 weeks after today's infusion - Lab every 2 weeks with appointment for blood transfusion - Follow-up in 4 weeks - Will arrange IV iron  in 2 weeks   Discussed the use of AI scribe software for clinical note transcription with the patient, who gave verbal consent to proceed.  History of Present Illness [name] is an 89 year old female with a neuroendocrine tumor and anemia who presents for follow-up. She is accompanied by her daughter.  Her hemoglobin level remains stable at 9.3 g/dL, with no blood transfusions required since June. She continues to receive Avastin  infusions approximately every two weeks, with the last infusion in July. She has a non-healing wound with an odor, for which her primary care physician recently prescribed antibiotics.     All other systems were reviewed with the patient and are negative.  MEDICAL HISTORY:  Past Medical History:  Diagnosis Date   (HFpEF) heart failure with preserved ejection fraction (HCC)    EF=60-60%   Actinic keratosis 01/17/2015   R forearm   Adjustment disorder with anxiety    Adverse effect of other narcotics, sequela    Intolerance to all narcotics   Anti-Duffy antibodies present    Aortic valve stenosis    Arthritis    some in my hands (11/11/2012)   Atrial fibrillation, permanent (HCC)    Eliquis    Atypical mole 03/25/2018   L forearm - severe   Automatic implantable cardioverter-defibrillator in situ    Avascular necrosis of hip (HCC) 05/03/2011   Carotid artery stenosis 09/2007   a. 09/2007: 60-79% bilateral (stable); b. 10/2008: 40-59% R 60-79%     Cellulitis of left lower extremity 07/13/2020   CKD (chronic kidney disease), stage III (HCC)    COPD (chronic obstructive pulmonary disease) (HCC)    Coronary artery disease    non-obstructive by 2006 cath   COVID-19 virus infection 02/12/2022   Displaced fracture of left femoral neck (HCC) 10/09/2018   GI bleed 03/28/2020   AVM   High cholesterol    Hypertension 05/20/2011   Hypertr obst cardiomyop    Hypotension, unspecified    cardiac cath 2006..nonobstructive CAD 30-40s lesions.SABRAETT 1/09 nondiagnostic due to poor HR response..Right Renal Cancer 2003   Iron  deficiency anemia    Long term (current) use of anticoagulants    On home oxygen  therapy    3L [place]   Osteoarthritis of right hip    PAD (peripheral artery disease) (HCC)    Pancreatic cancer (HCC)    sandostatin    PONV (postoperative nausea and vomiting)    Presence of permanent cardiac pacemaker    Pulmonary HTN (HCC)    RECTAL BLEEDING 10/13/2009   Qualifier: Diagnosis of  By: Kerman NP, Paula     Red blood cell antibody positive with compatible PRBC difficult to obtain    Anti FYA (Duffy a) antibody. Must be transfused with PRB which are Duffy Antigen Negative and Crossmatch Compatible   Renal cell carcinoma (HCC) 2005   s/p right nephrectomy   Squamous cell carcinoma of skin 01/17/2015   R lat wrist   Squamous cell  carcinoma of skin 01/29/2018   R post upper leg - superficially invasive   Squamous cell carcinoma of skin 03/25/2018   L lat foot   Squamous cell carcinoma of skin 11/09/2020   left lat foot - EDC 01/01/21, recurrent 01/31/21 - MOHs 02/22/21   Squamous cell carcinoma of skin 12/19/2021   Right Posterior Medial Thigh, EDC   Squamous cell carcinoma of skin 12/19/2021   SCC IS, L lat heel, EDC 02/04/2022   Squamous cell carcinoma of skin 01/21/2022   L forearm, EDC 02/04/2022   Squamous cell carcinoma of skin 01/21/2022   R lower leg below knee, EDC 02/04/2022   Squamous cell carcinoma of skin  01/21/2022   SCCIS, R post heel, EDC 02/04/2022   Squamous cell carcinoma of skin 07/01/2022   left lower abdomen, in situ, EDC   Squamous cell carcinoma of skin 07/01/2022   left medial chest, EDC   Urge incontinence    Venous stasis of both lower extremities     SURGICAL HISTORY: Past Surgical History:  Procedure Laterality Date   ABDOMINAL AORTOGRAM W/LOWER EXTREMITY N/A 02/12/2021   Procedure: ABDOMINAL AORTOGRAM W/LOWER EXTREMITY;  Surgeon: Sheree Penne Bruckner, MD;  Location[facility] INVASIVE CV LAB;  Service: Cardiovascular;  Laterality: N/A;   ABDOMINAL HYSTERECTOMY  1975   for benign causes   APPENDECTOMY     BI-VENTRICULAR PACEMAKER UPGRADE  05/04/2010   BIOPSY  02/28/2019   Procedure: BIOPSY;  Surgeon: Teressa Toribio SQUIBB, MD;  Location: [facility] ENDOSCOPY;  Service: Endoscopy;;   BIOPSY  03/04/2022   Procedure: BIOPSY;  Surgeon: Wilhelmenia Aloha Raddle., MD;  Location: [facility];  Service: Gastroenterology;;   CARDIAC CATHETERIZATION  2006   CARDIOVERSION N/A 02/20/2018   Procedure: CARDIOVERSION;  Surgeon: [name], MD;  Location: [facility] ORS;  Service: Cardiovascular;  Laterality: N/A;   CARDIOVERSION N/A 03/27/2018   Procedure: CARDIOVERSION;  Surgeon: [name], MD;  Location: [facility] ORS;  Service: Cardiovascular;  Laterality: N/A;   CATARACT EXTRACTION W/ INTRAOCULAR LENS  IMPLANT, BILATERAL  01/2006-02-2006   CHOLECYSTECTOMY N/A 11/11/2012   Procedure: LAPAROSCOPIC CHOLECYSTECTOMY WITH INTRAOPERATIVE CHOLANGIOGRAM;  Surgeon: Donnice POUR. Belinda, MD;  Location: [facility] OR;  Service: General;  Laterality: N/A;   COLONOSCOPY WITH PROPOFOL  N/A 02/28/2019   Procedure: COLONOSCOPY WITH PROPOFOL ;  Surgeon: Teressa Toribio SQUIBB, MD;  Location: [facility] ENDOSCOPY;  Service: Endoscopy;  Laterality: N/A;   ENTEROSCOPY N/A 03/30/2020   Procedure: ENTEROSCOPY;  Surgeon: San Sandor GAILS, DO;  Location: [facility] ENDOSCOPY;  Service: Gastroenterology;  Laterality: N/A;   ENTEROSCOPY N/A 02/10/2021    Procedure: ENTEROSCOPY;  Surgeon: Rollin Dover, MD;  Location: [facility] ENDOSCOPY;  Service: Endoscopy;  Laterality: N/A;   ENTEROSCOPY N/A 02/15/2022   Procedure: ENTEROSCOPY;  Surgeon: Federico Rosario BROCKS, MD;  Location: [facility] ENDOSCOPY;  Service: Gastroenterology;  Laterality: N/A;   ENTEROSCOPY N/A 11/06/2022   Procedure: ENTEROSCOPY;  Surgeon: Wilhelmenia Aloha Raddle., MD;  Location: [facility] ENDOSCOPY;  Service: Gastroenterology;  Laterality: N/A;   EP IMPLANTABLE DEVICE N/A 02/21/2016   Procedure: ICD Generator Changeout;  Surgeon: Elspeth BROCKS Sage, MD;  Location: [facility] INVASIVE CV LAB;  Service: Cardiovascular;  Laterality: N/A;   ESOPHAGOGASTRODUODENOSCOPY N/A 03/04/2022   Procedure: ESOPHAGOGASTRODUODENOSCOPY (EGD);  Surgeon: Wilhelmenia Aloha Raddle., MD;  Location: [facility];  Service: Gastroenterology;  Laterality: N/A;   ESOPHAGOGASTRODUODENOSCOPY (EGD) WITH PROPOFOL  N/A 02/28/2019   Procedure: ESOPHAGOGASTRODUODENOSCOPY (EGD) WITH PROPOFOL ;  Surgeon: Teressa Toribio SQUIBB, MD;  Location: [facility] ENDOSCOPY;  Service: Endoscopy;  Laterality: N/A;   EUS  N/A 03/04/2022   Procedure: UPPER ENDOSCOPIC ULTRASOUND (EUS) RADIAL;  Surgeon: Wilhelmenia Aloha Raddle., MD;  Location: [facility] ENDOSCOPY;  Service: Gastroenterology;  Laterality: N/A;   FINE NEEDLE ASPIRATION N/A 03/04/2022   Procedure: FINE NEEDLE ASPIRATION (FNA) LINEAR;  Surgeon: Wilhelmenia Aloha Raddle., MD;  Location: [facility] ENDOSCOPY;  Service: Gastroenterology;  Laterality: N/A;   GIVENS CAPSULE STUDY N/A 03/15/2019   Procedure: GIVENS CAPSULE STUDY;  Surgeon: Eda Iha, MD;  Location: [facility] ENDOSCOPY;  Service: Gastroenterology;  Laterality: N/A;   HOT HEMOSTASIS N/A 03/30/2020   Procedure: HOT HEMOSTASIS (ARGON PLASMA COAGULATION/BICAP);  Surgeon: San Sandor GAILS, DO;  Location: [facility] ENDOSCOPY;  Service: Gastroenterology;  Laterality: N/A;   HOT HEMOSTASIS N/A 02/10/2021   Procedure: HOT HEMOSTASIS (ARGON PLASMA COAGULATION/BICAP);  Surgeon: Rollin Dover, MD;   Location: [facility] ENDOSCOPY;  Service: Endoscopy;  Laterality: N/A;   HOT HEMOSTASIS N/A 02/15/2022   Procedure: HOT HEMOSTASIS (ARGON PLASMA COAGULATION/BICAP);  Surgeon: Federico Rosario BROCKS, MD;  Location: [facility] ENDOSCOPY;  Service: Gastroenterology;  Laterality: N/A;   HOT HEMOSTASIS N/A 11/06/2022   Procedure: HOT HEMOSTASIS (ARGON PLASMA COAGULATION/BICAP);  Surgeon: Wilhelmenia Aloha Raddle., MD;  Location: [facility] ENDOSCOPY;  Service: Gastroenterology;  Laterality: N/A;   INSERT / REPLACE / REMOVE PACEMAKER  05-01-11   02-28-05-/05-04-10-ICD-MEDTRONIC MAXIMAL DR   IR ANGIOGRAM SELECTIVE EACH ADDITIONAL VESSEL  02/18/2022   IR ANGIOGRAM SELECTIVE EACH ADDITIONAL VESSEL  02/18/2022   IR ANGIOGRAM VISCERAL SELECTIVE  02/16/2022   IR ANGIOGRAM VISCERAL SELECTIVE  02/16/2022   IR EMBO ART  VEN HEMORR LYMPH EXTRAV  INC GUIDE ROADMAPPING  02/16/2022   IR THORACENTESIS ASP PLEURAL SPACE W/IMG GUIDE  02/21/2022   IR THORACENTESIS ASP PLEURAL SPACE W/IMG GUIDE  02/22/2022   IR US  GUIDE VASC ACCESS RIGHT  02/16/2022   JOINT REPLACEMENT     LAPAROSCOPIC CHOLECYSTECTOMY  11/11/2012   LAPAROSCOPIC LYSIS OF ADHESIONS N/A 11/11/2012   Procedure: LAPAROSCOPIC LYSIS OF ADHESIONS;  Surgeon: Donnice POUR. Belinda, MD;  Location: [facility] OR;  Service: General;  Laterality: N/A;   NEPHRECTOMY Right 06/2001    S/P RENAL CELL CANCER   PERIPHERAL VASCULAR INTERVENTION Bilateral 02/12/2021   Procedure: PERIPHERAL VASCULAR INTERVENTION;  Surgeon: Sheree Penne Bruckner, MD;  Location: [facility] INVASIVE CV LAB;  Service: Cardiovascular;  Laterality: Bilateral;  Iliac artery stents   PRESSURE SENSOR/CARDIOMEMS N/A 02/03/2019   Procedure: PRESSURE SENSOR/CARDIOMEMS;  Surgeon: [name], MD;  Location: [facility] INVASIVE CV LAB;  Service: Cardiovascular;  Laterality: N/A;   RIGHT HEART CATH N/A 11/09/2018   Procedure: RIGHT HEART CATH;  Surgeon: [name], MD;  Location: [facility] INVASIVE CV LAB;  Service: Cardiovascular;  Laterality: N/A;   RIGHT HEART  CATH N/A 03/08/2019   Procedure: RIGHT HEART CATH;  Surgeon: [name], MD;  Location: [facility] INVASIVE CV LAB;  Service: Cardiovascular;  Laterality: N/A;   SUBMUCOSAL TATTOO INJECTION  02/28/2019   Procedure: SUBMUCOSAL TATTOO INJECTION;  Surgeon: Teressa Toribio SQUIBB, MD;  Location: [facility] ENDOSCOPY;  Service: Endoscopy;;   SUBMUCOSAL TATTOO INJECTION  11/06/2022   Procedure: SUBMUCOSAL TATTOO INJECTION;  Surgeon: Wilhelmenia Aloha Raddle., MD;  Location: [facility] ENDOSCOPY;  Service: Gastroenterology;;   TOTAL HIP ARTHROPLASTY Right 05/03/2011   Procedure: TOTAL HIP ARTHROPLASTY ANTERIOR APPROACH;  Surgeon: Bruckner CINDERELLA Poli;  Location: [facility] ORS;  Service: Orthopedics;  Laterality: Right;  Removal of Cannulated Screws Right Hip, Right Direct Anterior Hip Replacement   TOTAL HIP ARTHROPLASTY Left 10/09/2018   Procedure: TOTAL HIP ARTHROPLASTY ANTERIOR APPROACH;  Surgeon: Fidel Rogue, MD;  Location: [facility] OR;  Service: Orthopedics;  Laterality: Left;    I have reviewed the social history and family history with the patient and they are unchanged from previous note.  ALLERGIES:  is allergic to codeine, dilaudid  [hydromorphone ], tape, amoxil  [amoxicillin ], asa [aspirin ], ms contin  [morphine ], neurontin  [gabapentin ], and nsaids.  MEDICATIONS:  Current Outpatient Medications  Medication Sig Dispense Refill   acetaminophen  (TYLENOL ) 500 MG tablet Take 1,000 mg by mouth every 8 (eight) hours.     Ascorbic Acid  (VITAMIN C  PO) Take 1 tablet by mouth daily.     cefdinir  (OMNICEF ) 250 MG/5ML suspension Take 6 mLs (300 mg total) by mouth 2 (two) times daily.     Cholecalciferol  (VITAMIN D -3 PO) Take 2 capsules by mouth daily.     clotrimazole -betamethasone  (LOTRISONE ) cream Apply topically 2 (two) times daily. 30 g 0   CRANBERRY PO Take 1 tablet by mouth daily.     Cyanocobalamin  (B-12 PO) Take 1 tablet by mouth daily.     ferrous gluconate  (FERGON) 324 MG tablet Take 1 tablet (324 mg total) by mouth daily with  breakfast. 30 tablet 3   fluticasone  (FLONASE ) 50 MCG/ACT nasal spray Place 2 sprays into both nostrils daily as needed for allergies or rhinitis. 16 g 0   gabapentin  (NEURONTIN ) 100 MG capsule Take 1 capsule (100 mg total) by mouth at bedtime. 30 capsule 1   levofloxacin  (LEVAQUIN ) 250 MG tablet Take 2 tablets (500 mg total) by mouth daily for 1 day, THEN 1 tablet (250 mg total) daily for 6 days. 8 tablet 0   metolazone  (ZAROXOLYN ) 2.5 MG tablet Take 1 tablet (2.5 mg total) by mouth 3 (three) times a week. Every Tues., Thurs and Sat. with 40 meq of potassium 90 tablet 3   Multiple Vitamins-Minerals (HAIR SKIN NAILS PO) Take 1 capsule by mouth daily.     pantoprazole  (PROTONIX ) 40 MG tablet Take 1 tablet (40 mg total) by mouth daily. 30 tablet 3   Potassium Chloride  ER 20 MEQ TBCR Take 1 tablet (20 mEq total) by mouth daily. 30 tablet 2   rosuvastatin  (CRESTOR ) 10 MG tablet Take 1 tablet (10 mg total) by mouth daily. (Patient not taking: Reported on 12/01/2023) 30 tablet 0   SANTYL 250 UNIT/GM ointment Apply topically daily.     torsemide  (DEMADEX ) 20 MG tablet Take 5 tablets (100 mg total) by mouth 2 (two) times daily. 250 tablet 3   traMADol  (ULTRAM ) 50 MG tablet Take 1 tablet (50 mg total) by mouth 2 (two) times daily as needed. 30 tablet 0   No current facility-administered medications for this visit.    PHYSICAL EXAMINATION: ECOG PERFORMANCE STATUS: 2 - Symptomatic, <50% confined to bed  Vitals:   12/08/23 1218 12/08/23 1219  BP: (!) 168/70 (!) 150/60  Pulse: (!) 59   Resp: 16   Temp: 98.5 F (36.9 C)   SpO2: 100%    Wt Readings from Last 3 Encounters:  12/01/23 131 lb (59.4 kg)  11/26/23 132 lb 9.6 oz (60.1 kg)  11/10/23 134 lb (60.8 kg)     GENERAL:alert, no distress and comfortable SKIN: skin color, texture, turgor are normal, (+) skin hyperpigmentation on bilateral lower extremity, left side is wrapped with gauze EYES: normal, Conjunctiva are pink and non-injected, sclera  clear Musculoskeletal:no cyanosis of digits and no clubbing  NEURO: alert & oriented x 3 with fluent speech, no focal motor/sensory deficits  Physical Exam    LABORATORY DATA:  I have reviewed the data as  listed    Latest Ref Rng & Units 12/08/2023   11:45 AM 11/24/2023    2:50 PM 11/10/2023   10:48 AM  CBC  WBC 4.0 - 10.5 K/uL 4.6  4.7  3.6   Hemoglobin 12.0 - 15.0 g/dL 9.3  9.7  9.1   Hematocrit 36.0 - 46.0 % 31.7  32.7  30.5   Platelets 150 - 400 K/uL 194  202  168         Latest Ref Rng & Units 11/26/2023    2:59 PM 10/01/2023    3:05 PM 09/17/2023    6:03 AM  CMP  Glucose 70 - 99 mg/dL 75  94  845   BUN 8 - 27 mg/dL 56  54  73   Creatinine 0.57 - 1.00 mg/dL 8.28  7.60  8.28   Sodium 134 - 144 mmol/L 139  138  136   Potassium 3.5 - 5.2 mmol/L 4.6  3.5  4.1   Chloride 96 - 106 mmol/L 97  95  93   CO2 20 - 29 mmol/L 25  33  32   Calcium  8.7 - 10.3 mg/dL 9.2  9.6  9.0   Total Protein 6.5 - 8.1 g/dL  8.1    Total Bilirubin 0.0 - 1.2 mg/dL  0.6    Alkaline Phos 38 - 126 U/L  52    AST 15 - 41 U/L  15    ALT 0 - 44 U/L  6        RADIOGRAPHIC STUDIES: I have personally reviewed the radiological images as listed and agreed with the findings in the report. No results found.    No orders of the defined types were placed in this encounter.  All questions were answered. The patient knows to call the clinic with any problems, questions or concerns. No barriers to learning was detected. The total time spent in the appointment was 25 minutes, including review of chart and various tests results, discussions about plan of care and coordination of care plan     Onita Mattock, MD 12/08/2023

## 2023-12-08 NOTE — Assessment & Plan Note (Signed)
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 -She previously had reaction to blood transfusion, or required multiple premedications for blood transfusion -She was recently hospitalized for anemia and a GI bleeding in July 2024. -She is currently on ESA every 2 weeks, IV iron  as needed, and blood transfusion as needed. -Due to her multiple hospital admission for anemia and GI bleeding, she started bevacizumab  for AVM on 02/12/2023. She has missed some treatments due to hospital admission etc.

## 2023-12-08 NOTE — Assessment & Plan Note (Signed)
 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   -Outpatient EUS 03/04/2022 by Dr. 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 Freida Busman did not recommend surgery in multifocal NET and also due to her age and co-morbidities. -She began first line sandostatin injections q28 days on 03/27/22.  -Dotatate PET scan, done 2 weeks after first injection 04/10/22, shows no significant tracer avidity in the pancrease or distant sites. Findings are likely due to close timing relative to the sandostatin injection blocking receptors. When we repeat this for restaging next year, will arrange 4 weeks after sandostatin -She has persistent anemia, likely secondary to GI bleeding from the tumor and AVM, she has had multiple hospital admission for anemia and bleeding.

## 2023-12-08 NOTE — Patient Instructions (Signed)
 [facility] - A DEPT OF [facility]  Discharge Instructions: Thank you for choosing [facility] to provide your oncology and hematology care.   If you have a lab appointment with the Cancer Center, please go directly to the Cancer Center and check in at the registration area.   Wear comfortable clothing and clothing appropriate for easy access to any Portacath or PICC line.   We strive to give you quality time with your provider. You may need to reschedule your appointment if you arrive late (15 or more minutes).  Arriving late affects you and other patients whose appointments are after yours.  Also, if you miss three or more appointments without notifying the office, you may be dismissed from the clinic at the provider's discretion.      For prescription refill requests, have your pharmacy contact our office and allow 72 hours for refills to be completed.    Today you received the following chemotherapy and/or immunotherapy agents: Bevacizumab      To help prevent nausea and vomiting after your treatment, we encourage you to take your nausea medication as directed.  BELOW ARE SYMPTOMS THAT SHOULD BE REPORTED IMMEDIATELY: *FEVER GREATER THAN 100.4 F (38 C) OR HIGHER *CHILLS OR SWEATING *NAUSEA AND VOMITING THAT IS NOT CONTROLLED WITH YOUR NAUSEA MEDICATION *UNUSUAL SHORTNESS OF BREATH *UNUSUAL BRUISING OR BLEEDING *URINARY PROBLEMS (pain or burning when urinating, or frequent urination) *BOWEL PROBLEMS (unusual diarrhea, constipation, pain near the anus) TENDERNESS IN MOUTH AND THROAT WITH OR WITHOUT PRESENCE OF ULCERS (sore throat, sores in mouth, or a toothache) UNUSUAL RASH, SWELLING OR PAIN  UNUSUAL VAGINAL DISCHARGE OR ITCHING   Items with * indicate a potential emergency and should be followed up as soon as possible or go to the Emergency Department if any problems should occur.  Please show the CHEMOTHERAPY ALERT CARD or  IMMUNOTHERAPY ALERT CARD at check-in to the Emergency Department and triage nurse.  Should you have questions after your visit or need to cancel or reschedule your appointment, please contact [facility] - A DEPT OF [facility]  Dept: [phone]  and follow the prompts.  Office hours are 8:00 a.m. to 4:30 p.m. Monday - Friday. Please note that voicemails left after 4:00 p.m. may not be returned until the following business day.  We are closed weekends and major holidays. You have access to a nurse at all times for urgent questions. Please call the main number to the clinic Dept: [phone] and follow the prompts.   For any non-urgent questions, you may also contact your provider using MyChart. We now offer e-Visits for anyone 67 and older to request care online for non-urgent symptoms. For details visit [email or web address].   Also download the MyChart app! Go to the app store, search "MyChart", open the app, select [place], and log in with your MyChart username and password.

## 2023-12-09 ENCOUNTER — Encounter: Attending: Physician Assistant | Admitting: Physician Assistant

## 2023-12-09 ENCOUNTER — Encounter: Payer: Self-pay | Admitting: Oncology

## 2023-12-09 ENCOUNTER — Encounter: Payer: Self-pay | Admitting: Hematology

## 2023-12-09 DIAGNOSIS — I739 Peripheral vascular disease, unspecified: Secondary | ICD-10-CM | POA: Diagnosis not present

## 2023-12-09 DIAGNOSIS — C253 Malignant neoplasm of pancreatic duct: Secondary | ICD-10-CM | POA: Insufficient documentation

## 2023-12-09 DIAGNOSIS — I890 Lymphedema, not elsewhere classified: Secondary | ICD-10-CM | POA: Insufficient documentation

## 2023-12-09 DIAGNOSIS — L97822 Non-pressure chronic ulcer of other part of left lower leg with fat layer exposed: Secondary | ICD-10-CM | POA: Insufficient documentation

## 2023-12-09 DIAGNOSIS — I5042 Chronic combined systolic (congestive) and diastolic (congestive) heart failure: Secondary | ICD-10-CM | POA: Diagnosis not present

## 2023-12-09 DIAGNOSIS — N184 Chronic kidney disease, stage 4 (severe): Secondary | ICD-10-CM | POA: Insufficient documentation

## 2023-12-09 DIAGNOSIS — Z950 Presence of cardiac pacemaker: Secondary | ICD-10-CM | POA: Insufficient documentation

## 2023-12-09 DIAGNOSIS — I87332 Chronic venous hypertension (idiopathic) with ulcer and inflammation of left lower extremity: Secondary | ICD-10-CM | POA: Diagnosis not present

## 2023-12-09 DIAGNOSIS — L97522 Non-pressure chronic ulcer of other part of left foot with fat layer exposed: Secondary | ICD-10-CM | POA: Insufficient documentation

## 2023-12-09 NOTE — Addendum Note (Signed)
 Addended by: [name] on: 12/09/2023 08:15 AM   Modules accepted: Orders

## 2023-12-10 ENCOUNTER — Inpatient Hospital Stay

## 2023-12-16 ENCOUNTER — Other Ambulatory Visit: Payer: Self-pay | Admitting: Family Medicine

## 2023-12-16 ENCOUNTER — Other Ambulatory Visit: Payer: Self-pay

## 2023-12-16 NOTE — Telephone Encounter (Addendum)
 Plz associate dx code for rx.  Name of Medication:  Tramadol  Name of Pharmacy:  [facility] Last Fill or Written Date and Quantity:  12/01/23, #30 Last Office Visit and Type:  12/01/23, f/u Next Office Visit and Type:   none Last Controlled Substance Agreement Date:  none Last UDS:  none

## 2023-12-18 DIAGNOSIS — I5033 Acute on chronic diastolic (congestive) heart failure: Secondary | ICD-10-CM | POA: Diagnosis not present

## 2023-12-18 DIAGNOSIS — J449 Chronic obstructive pulmonary disease, unspecified: Secondary | ICD-10-CM | POA: Diagnosis not present

## 2023-12-18 NOTE — Telephone Encounter (Signed)
 Copied from CRM [id number]. Topic: Clinical - Medication Question >> Dec 18, 2023 10:21 AM Revonda D wrote: Reason for CRM: Pt is calling to check the status of the medication refill for the traMADol  (ULTRAM ) 50 MG tablet. Pt stated that she called in 2 days ago and hasn't heard anything back. I informed the pt that the status is currently pending. Pt stated that she would like for the medication to be approved today and would also like to receive a callback today with an update.

## 2023-12-19 NOTE — Telephone Encounter (Signed)
 Spoke with pt relaying Dr Talmadge message. Pt verbalizes understanding and denies any constipation or sedation issues.

## 2023-12-19 NOTE — Telephone Encounter (Signed)
 Plz notify pt this was sent in today.  Looks like she's using BID scheduled. Ensure no constipation, sedation, otherwise tolerating ok.

## 2023-12-22 ENCOUNTER — Inpatient Hospital Stay

## 2023-12-22 ENCOUNTER — Inpatient Hospital Stay: Admitting: Hematology

## 2023-12-22 DIAGNOSIS — K5520 Angiodysplasia of colon without hemorrhage: Secondary | ICD-10-CM

## 2023-12-22 DIAGNOSIS — D649 Anemia, unspecified: Secondary | ICD-10-CM

## 2023-12-22 DIAGNOSIS — Z5112 Encounter for antineoplastic immunotherapy: Secondary | ICD-10-CM | POA: Diagnosis not present

## 2023-12-22 DIAGNOSIS — D500 Iron deficiency anemia secondary to blood loss (chronic): Secondary | ICD-10-CM

## 2023-12-22 DIAGNOSIS — D3A8 Other benign neuroendocrine tumors: Secondary | ICD-10-CM

## 2023-12-22 LAB — CBC WITH DIFFERENTIAL (CANCER CENTER ONLY)
Abs Immature Granulocytes: 0.01 K/uL (ref 0.00–0.07)
Basophils Absolute: 0 K/uL (ref 0.0–0.1)
Basophils Relative: 1 %
Eosinophils Absolute: 0 K/uL (ref 0.0–0.5)
Eosinophils Relative: 1 %
HCT: 33.5 % — ABNORMAL LOW (ref 36.0–46.0)
Hemoglobin: 10.1 g/dL — ABNORMAL LOW (ref 12.0–15.0)
Immature Granulocytes: 0 %
Lymphocytes Relative: 10 %
Lymphs Abs: 0.4 K/uL — ABNORMAL LOW (ref 0.7–4.0)
MCH: 23.4 pg — ABNORMAL LOW (ref 26.0–34.0)
MCHC: 30.1 g/dL (ref 30.0–36.0)
MCV: 77.5 fL — ABNORMAL LOW (ref 80.0–100.0)
Monocytes Absolute: 0.5 K/uL (ref 0.1–1.0)
Monocytes Relative: 11 %
Neutro Abs: 3.3 K/uL (ref 1.7–7.7)
Neutrophils Relative %: 77 %
Platelet Count: 147 K/uL — ABNORMAL LOW (ref 150–400)
RBC: 4.32 MIL/uL (ref 3.87–5.11)
RDW: 19.2 % — ABNORMAL HIGH (ref 11.5–15.5)
WBC Count: 4.3 K/uL (ref 4.0–10.5)
nRBC: 0 % (ref 0.0–0.2)

## 2023-12-22 LAB — TOTAL PROTEIN, URINE DIPSTICK: Protein, ur: NEGATIVE mg/dL

## 2023-12-22 LAB — SAMPLE TO BLOOD BANK

## 2023-12-22 LAB — FERRITIN: Ferritin: 45 ng/mL (ref 11–307)

## 2023-12-23 ENCOUNTER — Other Ambulatory Visit: Payer: Self-pay

## 2023-12-24 ENCOUNTER — Telehealth: Payer: Self-pay

## 2023-12-24 ENCOUNTER — Inpatient Hospital Stay

## 2023-12-24 NOTE — Telephone Encounter (Signed)
 Spoke with pt via telephone regarding appt for today.  Pt stated she wasn't aware of her appt today and therefore is unable to come since she relies on someone else to bring her to her appts.  Pt requested if the appt could be rescheduled.  Stated Dr. Feng's scheduler will give the pt a call to get her rescheduled.

## 2023-12-30 ENCOUNTER — Ambulatory Visit: Admitting: Internal Medicine

## 2024-01-01 ENCOUNTER — Ambulatory Visit: Admission: RE | Admit: 2024-01-01 | Discharge: 2024-01-01 | Disposition: A | Discharge status: Home / Self Care

## 2024-01-01 ENCOUNTER — Ambulatory Visit
Admission: RE | Admit: 2024-01-01 | Discharge: 2024-01-01 | Disposition: A | Discharge status: Home / Self Care | Source: Ambulatory Visit

## 2024-01-01 ENCOUNTER — Other Ambulatory Visit
Admission: RE | Admit: 2024-01-01 | Discharge: 2024-01-01 | Disposition: A | Discharge status: Home / Self Care | Source: Home / Self Care

## 2024-01-01 ENCOUNTER — Ambulatory Visit (INDEPENDENT_AMBULATORY_CARE_PROVIDER_SITE_OTHER)

## 2024-01-01 DIAGNOSIS — I83009 Varicose veins of unspecified lower extremity with ulcer of unspecified site: Secondary | ICD-10-CM | POA: Insufficient documentation

## 2024-01-01 DIAGNOSIS — L97909 Non-pressure chronic ulcer of unspecified part of unspecified lower leg with unspecified severity: Secondary | ICD-10-CM | POA: Diagnosis not present

## 2024-01-01 DIAGNOSIS — L97928 Non-pressure chronic ulcer of unspecified part of left lower leg with other specified severity: Secondary | ICD-10-CM | POA: Diagnosis not present

## 2024-01-01 DIAGNOSIS — M85872 Other specified disorders of bone density and structure, left ankle and foot: Secondary | ICD-10-CM | POA: Diagnosis not present

## 2024-01-01 DIAGNOSIS — I872 Venous insufficiency (chronic) (peripheral): Secondary | ICD-10-CM

## 2024-01-01 DIAGNOSIS — S91302A Unspecified open wound, left foot, initial encounter: Secondary | ICD-10-CM | POA: Diagnosis not present

## 2024-01-01 DIAGNOSIS — M7989 Other specified soft tissue disorders: Secondary | ICD-10-CM | POA: Diagnosis not present

## 2024-01-01 NOTE — Patient Instructions (Addendum)
 Due to recent changes in healthcare laws, you may see results of your pathology and/or laboratory studies on MyChart before the doctors have had a chance to review them. We understand that in some cases there may be results that are confusing or concerning to you. Please understand that not all results are received at the same time and often the doctors may need to interpret multiple results in order to provide you with the best plan of care or course of treatment. Therefore, we ask that you please give us  2 business days to thoroughly review all your results before contacting the office for clarification. Should we see a critical lab result, you will be contacted sooner.   If You Need Anything After Your Visit  If you have any questions or concerns for your doctor, please call our main line at [phone] and press option 4 to reach your doctor's medical assistant. If no one answers, please leave a voicemail as directed and we will return your call as soon as possible. Messages left after 4 pm will be answered the following business day.   You may also send us  a message via MyChart. We typically respond to MyChart messages within 1-2 business days.  For prescription refills, please ask your pharmacy to contact our office. Our fax number is [phone].  If you have an urgent issue when the clinic is closed that cannot wait until the next business day, you can page your doctor at the number below.    Please note that while we do our best to be available for urgent issues outside of office hours, we are not available 24/7.   If you have an urgent issue and are unable to reach us , you may choose to seek medical care at your doctor's office, retail clinic, urgent care center, or emergency room.  If you have a medical emergency, please immediately call 911 or go to the emergency department.  Pager Numbers  - Dr. Hester: [phone]  - Dr. Jackquline: [phone]  - Dr. Claudene: [phone]    - Dr. Raymund: [phone]  In the event of inclement weather, please call our main line at [phone] for an update on the status of any delays or closures.  Dermatology Medication Tips: Please keep the boxes that topical medications come in in order to help keep track of the instructions about where and how to use these. Pharmacies typically print the medication instructions only on the boxes and not directly on the medication tubes.   If your medication is too expensive, please contact our office at [phone] option 4 or send us  a message through MyChart.   We are unable to tell what your co-pay for medications will be in advance as this is different depending on your insurance coverage. However, we may be able to find a substitute medication at lower cost or fill out paperwork to get insurance to cover a needed medication.   If a prior authorization is required to get your medication covered by your insurance company, please allow us  1-2 business days to complete this process.  Drug prices often vary depending on where the prescription is filled and some pharmacies may offer cheaper prices.  The website www.goodrx.com contains coupons for medications through different pharmacies. The prices here do not account for what the cost may be with help from insurance (it may be cheaper with your insurance), but the website can give you the price if you did not use any insurance.  - You can print the  associated coupon and take it with your prescription to the pharmacy.  - You may also stop by our office during regular business hours and pick up a GoodRx coupon card.  - If you need your prescription sent electronically to a different pharmacy, notify our office through [employer] or by phone at [phone] option 4.     Si Usted Necesita Algo Despus de Su Visita  Tambin puede enviarnos un mensaje a travs de Clinical cytogeneticist. Por lo general respondemos a los mensajes de MyChart en el  transcurso de 1 a 2 das hbiles.  Para renovar recetas, por favor pida a su farmacia que se ponga en contacto con nuestra oficina. Randi lakes de fax es [place] [phone].  Si tiene un asunto urgente cuando la clnica est cerrada y que no puede esperar hasta el siguiente da hbil, puede llamar/localizar a su doctor(a) al nmero que aparece a continuacin.   Por favor, tenga en cuenta que aunque hacemos todo lo posible para estar disponibles para asuntos urgentes fuera del horario de [place], no estamos disponibles las 24 horas del da, los 7 [address]  [address] de la [place].   Si tiene un problema urgente y no puede comunicarse con nosotros, puede optar por buscar atencin mdica  en el consultorio de su doctor(a), en una clnica privada, en un centro de atencin urgente o en una sala de emergencias.  Si tiene Engineer, drilling, por favor llame inmediatamente al 911 o vaya a la sala de emergencias.  Nmeros de bper  - Dr. Hester: [phone]  - Dra. Jackquline: [phone]  - Dr. Claudene: [phone]  - Dra. Kitts: [phone]  En caso de inclemencias del [place], por favor llame a nuestra lnea principal al [phone] para una actualizacin sobre el estado de cualquier retraso o cierre.  Consejos para la medicacin en dermatologa: Por favor, guarde las cajas en las que vienen los medicamentos de uso tpico para ayudarle a seguir las instrucciones sobre dnde y cmo usarlos. Las farmacias generalmente imprimen las instrucciones del medicamento slo en las cajas y no directamente en los tubos del [place].   Si su medicamento es muy caro, por favor, pngase en contacto con landry rieger llamando al [phone] y presione la opcin 4 o envenos un mensaje a travs de Clinical cytogeneticist.   No podemos decirle cul ser su copago por los medicamentos por adelantado ya que esto es diferente dependiendo de la cobertura de su seguro. Sin embargo, es posible que podamos encontrar un medicamento sustituto  a Audiological scientist un formulario para que el seguro cubra el medicamento que se considera necesario.   Si se requiere una autorizacin previa para que su compaa de seguros malta su medicamento, por favor permtanos de 1 a 2 das hbiles para completar este proceso.  Los precios de los medicamentos varan con frecuencia dependiendo del Environmental consultant de dnde se surte la receta y alguna farmacias pueden ofrecer precios ms baratos.  El sitio web www.goodrx.com tiene cupones para medicamentos de Health and safety inspector. Los precios aqu no tienen en cuenta lo que podra costar con la ayuda del seguro (puede ser ms barato con su seguro), pero el sitio web puede darle el precio si no utiliz Tourist information centre manager.  - Puede imprimir el cupn correspondiente y llevarlo con su receta a la farmacia.  - Tambin puede pasar por nuestra oficina durante el horario de atencin regular y Education officer, museum una tarjeta de cupones de GoodRx.  - Si necesita que su receta se enve electrnicamente a [employer],  informe a nuestra oficina a travs de MyChart de [place] o por telfono llamando al [phone] y presione la opcin 4.

## 2024-01-01 NOTE — Addendum Note (Signed)
 Addended by: RAYMUND LAURAINE BROCKS on: 01/01/2024 01:28 PM   Modules accepted: Level of Service

## 2024-01-01 NOTE — Progress Notes (Addendum)
.    Follow-Up Visit   Subjective  [name] is a 89 y.o. 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 Patient being treated at the wound care center and not getting any better. She has recently been prescribed levaquin  and cefdinir  due to concern for infection.   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   She has history of venous disease, PAD.   Niece-Paula  is with patient and contributes to history.   The following portions of the chart were reviewed this encounter and updated as appropriate: medications, allergies, medical history  Review of Systems:  No other skin or systemic complaints except as noted in HPI or Assessment and Plan.  Objective  Well appearing patient in no apparent distress; mood and affect are within normal limits.  A focused examination was performed of the following areas: Right leg, left leg   Bilateral lower extremities with hyperkeratosis, stasis changes L anterior lower extremity with 4 cm necrotic plaque L lateral foot with 2 cm necrotic plaque             Assessment & Plan   Necrotic ulceration of L anterior lower extremity, L lateral foot iso patient with venous disease, peripheral disease, chronic anemia due to GI bleed/AVMs on bevacizumab  - chronic, not at tx goal   - Discussed extensively with patient, niece that lesions are most likely secondary to chronic venous disease, PAD with overlying superinfection. We discussed alternate differentials including malignancy (potentially recurrent SCC given prior hx of this), pyoderma gangrenosum, infection. Discussed best way to diagnose this would be through biopsy and tissue culture of the affected lesion. Patient states this would be too painful and does not want to pursue this.  - Discussed that  background poor blood flow, necrotic tissue, use of bevacizumab  all contribute to poor wound healing.  Discussed that because of this it'll be difficult to heal lesions and may require ongoing debridements with wound care team, antibiotics  - Patient/niece opted to obtain aerobe/anaerobe swab and Xray (to r/o underlying osteomyelitis)   - She denied any systemic sx including fevers, chills - advised would need to go to ED if develops  this. Discussed may need admission for ongoing care, debridement, IV abx  - Message sent to Oncologist and Wound Care team  - Discussed role of palliative care in assisting with ongoing pain management, GOC discussions   VENOUS ULCER (HCC)   Related Procedures Aerobic culture DG Foot Complete Left DG Tibia/Fibula Left  No follow-ups on file.  IFay Kirks, CMA, am acting as scribe for Lauraine JAYSON Kanaris, MD .   Documentation: I have reviewed the above documentation for accuracy and completeness, and I agree with the above.  Lauraine JAYSON Kanaris, MD

## 2024-01-02 ENCOUNTER — Telehealth: Payer: Self-pay

## 2024-01-02 ENCOUNTER — Other Ambulatory Visit: Payer: Self-pay | Admitting: Family Medicine

## 2024-01-02 DIAGNOSIS — I83009 Varicose veins of unspecified lower extremity with ulcer of unspecified site: Secondary | ICD-10-CM

## 2024-01-02 MED ORDER — CEFDINIR 250 MG/5ML PO SUSR: 300.0000 mg | Freq: Every day | ORAL | 0 refills | Status: DC

## 2024-01-02 NOTE — Telephone Encounter (Signed)
 ERx

## 2024-01-02 NOTE — Telephone Encounter (Signed)
 Name of Medication:  Tramadol  Name of Pharmacy:  [facility] Last Fill or Written Date and Quantity:  12/19/23, #30 Last Office Visit and Type:  12/01/23, f/u Next Office Visit and Type:   none Last Controlled Substance Agreement Date:  none Last UDS:  none

## 2024-01-02 NOTE — Telephone Encounter (Signed)
 Copied from CRM [id number]. Topic: Clinical - Medication Refill >> Jan 02, 2024  9:04 AM Macario HERO wrote: Medication: traMADol  (ULTRAM ) 50 MG tablet [[id number]]  Has the patient contacted their pharmacy? No (Agent: If no, request that the patient contact the pharmacy for the refill. If patient does not wish to contact the pharmacy document the reason why and proceed with request.) (Agent: If yes, when and what did the pharmacy advise?)  This is the patient's preferred pharmacy:  [facility] - [place], [place] - [address] [place] [address] Phone: [phone] Fax: [phone]  Is this the correct pharmacy for this prescription? Yes If no, delete pharmacy and type the correct one.   Has the prescription been filled recently? Yes  Is the patient out of the medication? Yes  Has the patient been seen for an appointment in the last year OR does the patient have an upcoming appointment? Yes  Can we respond through MyChart? Yes  Agent: Please be advised that Rx refills may take up to 3 business days. We ask that you follow-up with your pharmacy. >> Jan 02, 2024  3:14 PM Donna BRAVO wrote: Patient calling in regarding refill patient spoke to pharmacy before calling us ,as told they did not have the prescription. Patient is in pain due to cancer. Note in patient medication list  01/02/24 and after  Unknown Order Name Dose Route Frequency Maximum MME/Day   traMADol  (ULTRAM ) 50 MG tablet [Pharmacy Med Name: TRAMADOL  HYDROCHLORIDE 50MG  TABLET]     Unknown  Total Potential Morphine  Milligram Equivalents Per Day Unknown  An error was encountered while attempting to calculate the morphine  milligram equivalents per day for at least one order.   Calculation Information  Not enough information to calculate morphine  milligram equivalents per day.  Patient would like this marked urgent

## 2024-01-02 NOTE — Telephone Encounter (Signed)
 Copied from CRM [id number]. Topic: Clinical - Medication Refill >> Jan 02, 2024  9:04 AM Macario HERO wrote: Medication: traMADol  (ULTRAM ) 50 MG tablet [[id number]]  Has the patient contacted their pharmacy? No (Agent: If no, request that the patient contact the pharmacy for the refill. If patient does not wish to contact the pharmacy document the reason why and proceed with request.) (Agent: If yes, when and what did the pharmacy advise?)  This is the patient's preferred pharmacy:  [facility] - [place], [place] - [address] [place] [address] Phone: [phone] Fax: [phone]  Is this the correct pharmacy for this prescription? Yes If no, delete pharmacy and type the correct one.   Has the prescription been filled recently? Yes  Is the patient out of the medication? Yes  Has the patient been seen for an appointment in the last year OR does the patient have an upcoming appointment? Yes  Can we respond through MyChart? Yes  Agent: Please be advised that Rx refills may take up to 3 business days. We ask that you follow-up with your pharmacy.

## 2024-01-02 NOTE — Telephone Encounter (Signed)
 Called and spoke with patient to check in. Continues to endorse pain of LLE but denies any fevers, chills, systemic sx. Advised swab, xray still pending. Recommended eval by General Surgery for consideration of debridement - referral placed. Will restart cefdinir  300 mg daily (crcl <30, renally dosed). She was unable to tolerate levaquin .   Discussed if she develops any systemic symptoms including fevers, chills, nausea, vomiting, worsening pain or drainage she should go to ED for evaluation.

## 2024-01-06 ENCOUNTER — Ambulatory Visit: Payer: Self-pay

## 2024-01-06 LAB — AEROBIC CULTURE

## 2024-01-06 NOTE — Telephone Encounter (Signed)
 I contacted [facility] and they recommended adding test [id number] to test for sensitivities to corynebacterium striatum. Test added on and they are faxing over authorization form.

## 2024-01-07 ENCOUNTER — Encounter: Payer: Self-pay | Admitting: Hematology

## 2024-01-07 ENCOUNTER — Inpatient Hospital Stay (HOSPITAL_BASED_OUTPATIENT_CLINIC_OR_DEPARTMENT_OTHER): Admitting: Hematology

## 2024-01-07 ENCOUNTER — Inpatient Hospital Stay

## 2024-01-07 ENCOUNTER — Inpatient Hospital Stay: Attending: Nurse Practitioner

## 2024-01-07 VITALS — BP 138/58 | HR 62 | Temp 98.8°F | Resp 17 | Wt 120.2 lb

## 2024-01-07 DIAGNOSIS — D3A8 Other benign neuroendocrine tumors: Secondary | ICD-10-CM | POA: Diagnosis not present

## 2024-01-07 DIAGNOSIS — D649 Anemia, unspecified: Secondary | ICD-10-CM

## 2024-01-07 DIAGNOSIS — N184 Chronic kidney disease, stage 4 (severe): Secondary | ICD-10-CM | POA: Insufficient documentation

## 2024-01-07 DIAGNOSIS — Z79899 Other long term (current) drug therapy: Secondary | ICD-10-CM | POA: Insufficient documentation

## 2024-01-07 DIAGNOSIS — K5520 Angiodysplasia of colon without hemorrhage: Secondary | ICD-10-CM

## 2024-01-07 DIAGNOSIS — D500 Iron deficiency anemia secondary to blood loss (chronic): Secondary | ICD-10-CM

## 2024-01-07 DIAGNOSIS — D631 Anemia in chronic kidney disease: Secondary | ICD-10-CM | POA: Insufficient documentation

## 2024-01-07 DIAGNOSIS — Z5112 Encounter for antineoplastic immunotherapy: Secondary | ICD-10-CM | POA: Insufficient documentation

## 2024-01-07 LAB — CBC WITH DIFFERENTIAL (CANCER CENTER ONLY)
Abs Immature Granulocytes: 0.02 K/uL (ref 0.00–0.07)
Basophils Absolute: 0 K/uL (ref 0.0–0.1)
Basophils Relative: 1 %
Eosinophils Absolute: 0 K/uL (ref 0.0–0.5)
Eosinophils Relative: 0 %
HCT: 32.6 % — ABNORMAL LOW (ref 36.0–46.0)
Hemoglobin: 9.9 g/dL — ABNORMAL LOW (ref 12.0–15.0)
Immature Granulocytes: 0 %
Lymphocytes Relative: 15 %
Lymphs Abs: 0.8 K/uL (ref 0.7–4.0)
MCH: 23.7 pg — ABNORMAL LOW (ref 26.0–34.0)
MCHC: 30.4 g/dL (ref 30.0–36.0)
MCV: 78.2 fL — ABNORMAL LOW (ref 80.0–100.0)
Monocytes Absolute: 0.5 K/uL (ref 0.1–1.0)
Monocytes Relative: 9 %
Neutro Abs: 3.9 K/uL (ref 1.7–7.7)
Neutrophils Relative %: 75 %
Platelet Count: 217 K/uL (ref 150–400)
RBC: 4.17 MIL/uL (ref 3.87–5.11)
RDW: 19.9 % — ABNORMAL HIGH (ref 11.5–15.5)
WBC Count: 5.2 K/uL (ref 4.0–10.5)
nRBC: 0 % (ref 0.0–0.2)

## 2024-01-07 LAB — SAMPLE TO BLOOD BANK

## 2024-01-07 LAB — FERRITIN: Ferritin: 48 ng/mL (ref 11–307)

## 2024-01-07 LAB — TOTAL PROTEIN, URINE DIPSTICK: Protein, ur: NEGATIVE mg/dL

## 2024-01-07 MED ORDER — EPOETIN ALFA-EPBX 40000 UNIT/ML IJ SOLN
40000.0000 [IU] | Freq: Once | INTRAMUSCULAR | Status: AC
  Administered 2024-01-07: 40000 [IU] via SUBCUTANEOUS
  Filled 2024-01-07: qty 1

## 2024-01-07 MED ORDER — OCTREOTIDE ACETATE 20 MG IM KIT
20.0000 mg | PACK | Freq: Once | INTRAMUSCULAR | Status: AC
  Administered 2024-01-07: 20 mg via INTRAMUSCULAR
  Filled 2024-01-07: qty 1

## 2024-01-07 MED ORDER — SODIUM CHLORIDE 0.9 % IV SOLN: Freq: Once | INTRAVENOUS | Status: AC

## 2024-01-07 MED ORDER — SODIUM CHLORIDE 0.9 % IV SOLN
5.0000 mg/kg | Freq: Once | INTRAVENOUS | Status: AC
  Administered 2024-01-07: 300 mg via INTRAVENOUS
  Filled 2024-01-07: qty 12

## 2024-01-07 NOTE — Telephone Encounter (Signed)
 Add on form signed and faxed back to [facility].

## 2024-01-07 NOTE — Patient Instructions (Signed)
 [facility] - A DEPT OF [facility]  Discharge Instructions: Thank you for choosing [facility] to provide your oncology and hematology care.   If you have a lab appointment with the Cancer Center, please go directly to the Cancer Center and check in at the registration area.   Wear comfortable clothing and clothing appropriate for easy access to any Portacath or PICC line.   We strive to give you quality time with your provider. You may need to reschedule your appointment if you arrive late (15 or more minutes).  Arriving late affects you and other patients whose appointments are after yours.  Also, if you miss three or more appointments without notifying the office, you may be dismissed from the clinic at the provider's discretion.      For prescription refill requests, have your pharmacy contact our office and allow 72 hours for refills to be completed.    Today you received the following chemotherapy and/or immunotherapy agents vegzelma       To help prevent nausea and vomiting after your treatment, we encourage you to take your nausea medication as directed.  BELOW ARE SYMPTOMS THAT SHOULD BE REPORTED IMMEDIATELY: *FEVER GREATER THAN 100.4 F (38 C) OR HIGHER *CHILLS OR SWEATING *NAUSEA AND VOMITING THAT IS NOT CONTROLLED WITH YOUR NAUSEA MEDICATION *UNUSUAL SHORTNESS OF BREATH *UNUSUAL BRUISING OR BLEEDING *URINARY PROBLEMS (pain or burning when urinating, or frequent urination) *BOWEL PROBLEMS (unusual diarrhea, constipation, pain near the anus) TENDERNESS IN MOUTH AND THROAT WITH OR WITHOUT PRESENCE OF ULCERS (sore throat, sores in mouth, or a toothache) UNUSUAL RASH, SWELLING OR PAIN  UNUSUAL VAGINAL DISCHARGE OR ITCHING   Items with * indicate a potential emergency and should be followed up as soon as possible or go to the Emergency Department if any problems should occur.  Please show the CHEMOTHERAPY ALERT CARD or IMMUNOTHERAPY  ALERT CARD at check-in to the Emergency Department and triage nurse.  Should you have questions after your visit or need to cancel or reschedule your appointment, please contact [facility] - A DEPT OF [facility]  Dept: [phone]  and follow the prompts.  Office hours are 8:00 a.m. to 4:30 p.m. Monday - Friday. Please note that voicemails left after 4:00 p.m. may not be returned until the following business day.  We are closed weekends and major holidays. You have access to a nurse at all times for urgent questions. Please call the main number to the clinic Dept: [phone] and follow the prompts.   For any non-urgent questions, you may also contact your provider using MyChart. We now offer e-Visits for anyone 25 and older to request care online for non-urgent symptoms. For details visit [email or web address].   Also download the MyChart app! Go to the app store, search MyChart, open the app, select [place], and log in with your MyChart username and password.

## 2024-01-07 NOTE — Assessment & Plan Note (Signed)
 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   -Outpatient EUS 03/04/2022 by Dr. 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 Freida Busman did not recommend surgery in multifocal NET and also due to her age and co-morbidities. -She began first line sandostatin injections q28 days on 03/27/22.  -Dotatate PET scan, done 2 weeks after first injection 04/10/22, shows no significant tracer avidity in the pancrease or distant sites. Findings are likely due to close timing relative to the sandostatin injection blocking receptors. When we repeat this for restaging next year, will arrange 4 weeks after sandostatin -She has persistent anemia, likely secondary to GI bleeding from the tumor and AVM, she has had multiple hospital admission for anemia and bleeding.

## 2024-01-07 NOTE — Assessment & Plan Note (Signed)
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 -She previously had reaction to blood transfusion, or required multiple premedications for blood transfusion -She was recently hospitalized for anemia and a GI bleeding in July 2024. -She is currently on ESA every 2 weeks, IV iron  as needed, and blood transfusion as needed. -Due to her multiple hospital admission for anemia and GI bleeding, she started bevacizumab  for AVM on 02/12/2023. She has missed some treatments due to hospital admission etc.

## 2024-01-07 NOTE — Progress Notes (Signed)
 [facility]   [phone] Fax:([phone]) [phone]   Clinic Follow up Note   Patient Care Team: [name], MD as PCP - General (Family Medicine) [name], MD as PCP - Advanced Heart Failure (Cardiology) [name], MD as PCP - Cardiology (Cardiology) [name], MD as PCP - Electrophysiology (Cardiology) [name], MD as Consulting Physician (Cardiology) [name], MD as Consulting Physician (Cardiology) [name], [name], MD as Consulting Physician (Vascular Surgery) [name], MD as Consulting Physician (Hematology and Oncology) [name], MD as Consulting Physician (Oncology) [name], [facility] (Inactive) as Pharmacist (Pharmacist)  Date of Service:  01/07/2024  CHIEF COMPLAINT: f/u of anemia and pancreatic neuroendocrine tumor  CURRENT THERAPY:  Sandostatin  injection every 4 weeks Bevacizumab  every 4 weeks Retacrit  every 2 weeks if Hg<10.5 IV Feraheme  as needed if ferritin<50  Oncology History   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   -Outpatient EUS 03/04/2022 by Dr. 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 -She began first line sandostatin  injections q28 days on 03/27/22.  -Dotatate PET scan, done 2 weeks after first injection 04/10/22, shows no significant tracer avidity in the pancrease or distant sites. Findings are likely due  to close timing relative to the sandostatin  injection blocking receptors. 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 -She previously had reaction to blood transfusion, or required multiple premedications for blood transfusion -She was recently hospitalized for anemia and a GI bleeding in July 2024. -She is currently on ESA every 2 weeks, IV iron  as needed, and blood transfusion as needed. -Due to her multiple hospital admission for anemia and GI bleeding, she started bevacizumab  for AVM on 02/12/2023. She has missed some treatments due to hospital admission etc.    Assessment & Plan Neuroendocrine tumor with chronic non-healing lower extremity wound and possible malignancy Chronic non-healing wound on the lower extremity with potential malignancy. The wound is dry but not open, complicated by poor blood supply and bevacizumab  effects, which slow healing. A biopsy is needed for confirmation of malignancy. Debridement is considered but may initially worsen the wound. She is reluctant to undergo surgery due to potential poor healing and increased pain. The decision to continue bevacizumab  is weighed against the need for debridement, which requires cessation of the drug. - Continue bevacizumab  infusion every four weeks - Schedule biopsy and debridement if she decides to  proceed - Coordinate with wound care and dermatologist for further  management  Chronic iron  deficiency anemia due to blood loss Chronic iron  deficiency anemia secondary to blood loss, likely exacerbated by the neuroendocrine tumor and associated bleeding. Hemoglobin is at 9.8 with current treatment. Bevacizumab  helps maintain blood counts, reducing the need for frequent blood transfusions. She is hesitant to stop bevacizumab  due to the risk of increased transfusion requirements. - Administer IV iron  every four weeks, alternating with bevacizumab  infusions - Continue Retacrit  injections every two weeks for anemia - Cancel scheduled blood transfusion on September 5th  Chronic pain related to lower extremity wound Chronic pain associated with the non-healing lower extremity wound, severely affecting her quality of life. She is hesitant to allow wound care interventions due to pain. - Encourage follow-up with wound care for potential interventions  Chronic kidney disease, unspecified stage Chronic kidney disease, unspecified stage, contributing to the complexity of her overall health status.  Goals of Care She is 89 years old with multiple chronic conditions, including a non-healing wound, anemia, kidney disease, and heart disease. She is considering the balance between quality of life and aggressive treatment. She expresses reluctance to undergo surgery due to potential poor healing and increased pain. Aware that her medical conditions are not curable, she is considering her quality of life in decision-making. She and her family are contemplating whether to continue with current treatments or to focus on comfort and quality of life. I again discussed option of palliative care and hospice, she is not ready to stop medical treatment yet.   Plan - Lab reviewed, hemoglobin 9.9, cancer blood transfusion later this week. - Proceed to maintenance bevacizumab , Sandostatin  injection and Retacrit  today - Will schedule Feraheme  in the next few weeks - Follow-up in 4  weeks - Lab and injection every 2 weeks     SUMMARY OF ONCOLOGIC HISTORY: Oncology History   No history exists.     Discussed the use of AI scribe software for clinical note transcription with the patient, who gave verbal consent to proceed.  History of Present Illness [name] is an 89 year old female with a neuroendocrine tumor and anemia who presents for follow-up. She is accompanied by her daughter.  She experiences significant nausea and severe leg pain, described as pressure and pain radiating down her leg, with even light touch causing discomfort. She is currently receiving bevacizumab  infusions to manage bleeding and avoid anemia. She is concerned about the slow healing of wounds, recalling a previous wound that took five months to heal. There is worry about the need for frequent blood transfusions if the medication is stopped.  She has been evaluated by a wound clinic and dermatologist, Dr. Lauraine Kanaris. There is uncertainty about whether the lesion is cancerous, and a biopsy is needed to confirm. She is hesitant to allow wound care to touch the lesion due to pain.  She has a history of fluid retention, previously managed in the hospital, which improved her leg condition. However, the fluid has returned, worsening the condition again. She is on multiple medications, including bevacizumab  infusions every four weeks, and receives injections for her neuroendocrine tumor and anemia. She has been receiving IV iron  to help maintain her blood counts and avoid transfusions.     All other systems were reviewed with the patient and are negative.  MEDICAL HISTORY:  Past Medical History:  Diagnosis Date   (HFpEF) heart failure with preserved ejection fraction (HCC)    EF=60-60%   Actinic  keratosis 01/17/2015   R forearm   Adjustment disorder with anxiety    Adverse effect of other narcotics, sequela    Intolerance to all narcotics   Anti-Duffy antibodies present    Aortic  valve stenosis    Arthritis    some in my hands (11/11/2012)   Atrial fibrillation, permanent (HCC)    Eliquis    Atypical mole 03/25/2018   L forearm - severe   Automatic implantable cardioverter-defibrillator in situ    Avascular necrosis of hip (HCC) 05/03/2011   Carotid artery stenosis 09/2007   a. 09/2007: 60-79% bilateral (stable); b. 10/2008: 40-59% R 60-79%    Cellulitis of left lower extremity 07/13/2020   CKD (chronic kidney disease), stage III (HCC)    COPD (chronic obstructive pulmonary disease) (HCC)    Coronary artery disease    non-obstructive by 2006 cath   COVID-19 virus infection 02/12/2022   Displaced fracture of left femoral neck (HCC) 10/09/2018   GI bleed 03/28/2020   AVM   High cholesterol    Hypertension 05/20/2011   Hypertr obst cardiomyop    Hypotension, unspecified    cardiac cath 2006..nonobstructive CAD 30-40s lesions.SABRAETT 1/09 nondiagnostic due to poor HR response..Right Renal Cancer 2003   Iron  deficiency anemia    Long term (current) use of anticoagulants    On home oxygen  therapy    3L [place]   Osteoarthritis of right hip    PAD (peripheral artery disease) (HCC)    Pancreatic cancer (HCC)    sandostatin    PONV (postoperative nausea and vomiting)    Presence of permanent cardiac pacemaker    Pulmonary HTN (HCC)    RECTAL BLEEDING 10/13/2009   Qualifier: Diagnosis of  By: Kerman NP, Paula     Red blood cell antibody positive with compatible PRBC difficult to obtain    Anti FYA (Duffy a) antibody. Must be transfused with PRB which are Duffy Antigen Negative and Crossmatch Compatible   Renal cell carcinoma (HCC) 2005   s/p right nephrectomy   Squamous cell carcinoma of skin 01/17/2015   R lat wrist   Squamous cell carcinoma of skin 01/29/2018   R post upper leg - superficially invasive   Squamous cell carcinoma of skin 03/25/2018   L lat foot   Squamous cell carcinoma of skin 11/09/2020   left lat foot - EDC 01/01/21, recurrent 01/31/21 - MOHs  02/22/21   Squamous cell carcinoma of skin 12/19/2021   Right Posterior Medial Thigh, EDC   Squamous cell carcinoma of skin 12/19/2021   SCC IS, L lat heel, EDC 02/04/2022   Squamous cell carcinoma of skin 01/21/2022   L forearm, EDC 02/04/2022   Squamous cell carcinoma of skin 01/21/2022   R lower leg below knee, EDC 02/04/2022   Squamous cell carcinoma of skin 01/21/2022   SCCIS, R post heel, EDC 02/04/2022   Squamous cell carcinoma of skin 07/01/2022   left lower abdomen, in situ, EDC   Squamous cell carcinoma of skin 07/01/2022   left medial chest, EDC   Urge incontinence    Venous stasis of both lower extremities     SURGICAL HISTORY: Past Surgical History:  Procedure Laterality Date   ABDOMINAL AORTOGRAM W/LOWER EXTREMITY N/A 02/12/2021   Procedure: ABDOMINAL AORTOGRAM W/LOWER EXTREMITY;  Surgeon: Sheree Penne Bruckner, MD;  Location: [facility] INVASIVE CV LAB;  Service: Cardiovascular;  Laterality: N/A;   ABDOMINAL HYSTERECTOMY  1975   for benign causes   APPENDECTOMY     BI-VENTRICULAR PACEMAKER UPGRADE  05/04/2010  BIOPSY  02/28/2019   Procedure: BIOPSY;  Surgeon: Teressa Toribio SQUIBB, MD;  Location: [facility] ENDOSCOPY;  Service: Endoscopy;;   BIOPSY  03/04/2022   Procedure: BIOPSY;  Surgeon: Wilhelmenia Aloha Raddle., MD;  Location: [facility];  Service: Gastroenterology;;   CARDIAC CATHETERIZATION  2006   CARDIOVERSION N/A 02/20/2018   Procedure: CARDIOVERSION;  Surgeon: [name], MD;  Location: [facility] ORS;  Service: Cardiovascular;  Laterality: N/A;   CARDIOVERSION N/A 03/27/2018   Procedure: CARDIOVERSION;  Surgeon: [name], MD;  Location: [facility] ORS;  Service: Cardiovascular;  Laterality: N/A;   CATARACT EXTRACTION W/ INTRAOCULAR LENS  IMPLANT, BILATERAL  01/2006-02-2006   CHOLECYSTECTOMY N/A 11/11/2012   Procedure: LAPAROSCOPIC CHOLECYSTECTOMY WITH INTRAOPERATIVE CHOLANGIOGRAM;  Surgeon: Donnice POUR. Belinda, MD;  Location: [facility] OR;  Service: General;  Laterality: N/A;    COLONOSCOPY WITH PROPOFOL  N/A 02/28/2019   Procedure: COLONOSCOPY WITH PROPOFOL ;  Surgeon: Teressa Toribio SQUIBB, MD;  Location: [facility] ENDOSCOPY;  Service: Endoscopy;  Laterality: N/A;   ENTEROSCOPY N/A 03/30/2020   Procedure: ENTEROSCOPY;  Surgeon: San Sandor GAILS, DO;  Location: [facility] ENDOSCOPY;  Service: Gastroenterology;  Laterality: N/A;   ENTEROSCOPY N/A 02/10/2021   Procedure: ENTEROSCOPY;  Surgeon: Rollin Dover, MD;  Location: [facility] ENDOSCOPY;  Service: Endoscopy;  Laterality: N/A;   ENTEROSCOPY N/A 02/15/2022   Procedure: ENTEROSCOPY;  Surgeon: Federico Rosario BROCKS, MD;  Location: [facility] ENDOSCOPY;  Service: Gastroenterology;  Laterality: N/A;   ENTEROSCOPY N/A 11/06/2022   Procedure: ENTEROSCOPY;  Surgeon: Wilhelmenia Aloha Raddle., MD;  Location: [facility] ENDOSCOPY;  Service: Gastroenterology;  Laterality: N/A;   EP IMPLANTABLE DEVICE N/A 02/21/2016   Procedure: ICD Generator Changeout;  Surgeon: Elspeth BROCKS Sage, MD;  Location: [facility] INVASIVE CV LAB;  Service: Cardiovascular;  Laterality: N/A;   ESOPHAGOGASTRODUODENOSCOPY N/A 03/04/2022   Procedure: ESOPHAGOGASTRODUODENOSCOPY (EGD);  Surgeon: Wilhelmenia Aloha Raddle., MD;  Location: [facility];  Service: Gastroenterology;  Laterality: N/A;   ESOPHAGOGASTRODUODENOSCOPY (EGD) WITH PROPOFOL  N/A 02/28/2019   Procedure: ESOPHAGOGASTRODUODENOSCOPY (EGD) WITH PROPOFOL ;  Surgeon: Teressa Toribio SQUIBB, MD;  Location: [facility] ENDOSCOPY;  Service: Endoscopy;  Laterality: N/A;   EUS N/A 03/04/2022   Procedure: UPPER ENDOSCOPIC ULTRASOUND (EUS) RADIAL;  Surgeon: Wilhelmenia Aloha Raddle., MD;  Location: [facility] ENDOSCOPY;  Service: Gastroenterology;  Laterality: N/A;   FINE NEEDLE ASPIRATION N/A 03/04/2022   Procedure: FINE NEEDLE ASPIRATION (FNA) LINEAR;  Surgeon: Wilhelmenia Aloha Raddle., MD;  Location: [facility] ENDOSCOPY;  Service: Gastroenterology;  Laterality: N/A;   GIVENS CAPSULE STUDY N/A 03/15/2019   Procedure: GIVENS CAPSULE STUDY;  Surgeon: Eda Iha, MD;  Location: [facility] ENDOSCOPY;   Service: Gastroenterology;  Laterality: N/A;   HOT HEMOSTASIS N/A 03/30/2020   Procedure: HOT HEMOSTASIS (ARGON PLASMA COAGULATION/BICAP);  Surgeon: San Sandor GAILS, DO;  Location: [facility] ENDOSCOPY;  Service: Gastroenterology;  Laterality: N/A;   HOT HEMOSTASIS N/A 02/10/2021   Procedure: HOT HEMOSTASIS (ARGON PLASMA COAGULATION/BICAP);  Surgeon: Rollin Dover, MD;  Location: [facility] ENDOSCOPY;  Service: Endoscopy;  Laterality: N/A;   HOT HEMOSTASIS N/A 02/15/2022   Procedure: HOT HEMOSTASIS (ARGON PLASMA COAGULATION/BICAP);  Surgeon: Federico Rosario BROCKS, MD;  Location: [facility] ENDOSCOPY;  Service: Gastroenterology;  Laterality: N/A;   HOT HEMOSTASIS N/A 11/06/2022   Procedure: HOT HEMOSTASIS (ARGON PLASMA COAGULATION/BICAP);  Surgeon: Wilhelmenia Aloha Raddle., MD;  Location: [facility] ENDOSCOPY;  Service: Gastroenterology;  Laterality: N/A;   INSERT / REPLACE / REMOVE PACEMAKER  05-01-11   02-28-05-/05-04-10-ICD-MEDTRONIC MAXIMAL DR   IR ANGIOGRAM SELECTIVE EACH ADDITIONAL VESSEL  02/18/2022   IR ANGIOGRAM SELECTIVE EACH ADDITIONAL VESSEL  02/18/2022  IR ANGIOGRAM VISCERAL SELECTIVE  02/16/2022   IR ANGIOGRAM VISCERAL SELECTIVE  02/16/2022   IR EMBO ART  VEN HEMORR LYMPH EXTRAV  INC GUIDE ROADMAPPING  02/16/2022   IR THORACENTESIS ASP PLEURAL SPACE W/IMG GUIDE  02/21/2022   IR THORACENTESIS ASP PLEURAL SPACE W/IMG GUIDE  02/22/2022   IR US  GUIDE VASC ACCESS RIGHT  02/16/2022   JOINT REPLACEMENT     LAPAROSCOPIC CHOLECYSTECTOMY  11/11/2012   LAPAROSCOPIC LYSIS OF ADHESIONS N/A 11/11/2012   Procedure: LAPAROSCOPIC LYSIS OF ADHESIONS;  Surgeon: Donnice POUR. Belinda, MD;  Location: [facility] OR;  Service: General;  Laterality: N/A;   NEPHRECTOMY Right 06/2001    S/P RENAL CELL CANCER   PERIPHERAL VASCULAR INTERVENTION Bilateral 02/12/2021   Procedure: PERIPHERAL VASCULAR INTERVENTION;  Surgeon: Sheree Penne Bruckner, MD;  Location: [facility] INVASIVE CV LAB;  Service: Cardiovascular;  Laterality: Bilateral;  Iliac artery stents    PRESSURE SENSOR/CARDIOMEMS N/A 02/03/2019   Procedure: PRESSURE SENSOR/CARDIOMEMS;  Surgeon: [name], MD;  Location: [facility] INVASIVE CV LAB;  Service: Cardiovascular;  Laterality: N/A;   RIGHT HEART CATH N/A 11/09/2018   Procedure: RIGHT HEART CATH;  Surgeon: [name], MD;  Location: [facility] INVASIVE CV LAB;  Service: Cardiovascular;  Laterality: N/A;   RIGHT HEART CATH N/A 03/08/2019   Procedure: RIGHT HEART CATH;  Surgeon: [name], MD;  Location: [facility] INVASIVE CV LAB;  Service: Cardiovascular;  Laterality: N/A;   SUBMUCOSAL TATTOO INJECTION  02/28/2019   Procedure: SUBMUCOSAL TATTOO INJECTION;  Surgeon: Teressa Toribio SQUIBB, MD;  Location: [facility] ENDOSCOPY;  Service: Endoscopy;;   SUBMUCOSAL TATTOO INJECTION  11/06/2022   Procedure: SUBMUCOSAL TATTOO INJECTION;  Surgeon: Wilhelmenia Aloha Raddle., MD;  Location: [facility] ENDOSCOPY;  Service: Gastroenterology;;   TOTAL HIP ARTHROPLASTY Right 05/03/2011   Procedure: TOTAL HIP ARTHROPLASTY ANTERIOR APPROACH;  Surgeon: Bruckner CINDERELLA Poli;  Location: [facility] ORS;  Service: Orthopedics;  Laterality: Right;  Removal of Cannulated Screws Right Hip, Right Direct Anterior Hip Replacement   TOTAL HIP ARTHROPLASTY Left 10/09/2018   Procedure: TOTAL HIP ARTHROPLASTY ANTERIOR APPROACH;  Surgeon: Fidel Rogue, MD;  Location: [facility] OR;  Service: Orthopedics;  Laterality: Left;    I have reviewed the social history and family history with the patient and they are unchanged from previous note.  ALLERGIES:  is allergic to codeine, dilaudid  [hydromorphone ], tape, amoxil  [amoxicillin ], asa [aspirin ], ms contin  [morphine ], neurontin  [gabapentin ], and nsaids.  MEDICATIONS:  Current Outpatient Medications  Medication Sig Dispense Refill   acetaminophen  (TYLENOL ) 500 MG tablet Take 1,000 mg by mouth every 8 (eight) hours.     Ascorbic Acid  (VITAMIN C  PO) Take 1 tablet by mouth daily.     cefdinir  (OMNICEF ) 250 MG/5ML suspension Take 6 mLs (300 mg total) by mouth daily for 7  days. 42 mL 0   Cholecalciferol  (VITAMIN D -3 PO) Take 2 capsules by mouth daily.     clotrimazole -betamethasone  (LOTRISONE ) cream Apply topically 2 (two) times daily. 30 g 0   CRANBERRY PO Take 1 tablet by mouth daily.     Cyanocobalamin  (B-12 PO) Take 1 tablet by mouth daily.     ferrous gluconate  (FERGON) 324 MG tablet Take 1 tablet (324 mg total) by mouth daily with breakfast. 30 tablet 3   fluticasone  (FLONASE ) 50 MCG/ACT nasal spray Place 2 sprays into both nostrils daily as needed for allergies or rhinitis. 16 g 0   gabapentin  (NEURONTIN ) 100 MG capsule Take 1 capsule (100 mg total) by mouth at bedtime. 30 capsule 1   metolazone  (  ZAROXOLYN ) 2.5 MG tablet Take 1 tablet (2.5 mg total) by mouth 3 (three) times a week. Every Tues., Thurs and Sat. with 40 meq of potassium 90 tablet 3   Multiple Vitamins-Minerals (HAIR SKIN NAILS PO) Take 1 capsule by mouth daily.     pantoprazole  (PROTONIX ) 40 MG tablet Take 1 tablet (40 mg total) by mouth daily. 30 tablet 3   Potassium Chloride  ER 20 MEQ TBCR Take 1 tablet (20 mEq total) by mouth daily. 30 tablet 2   rosuvastatin  (CRESTOR ) 10 MG tablet Take 1 tablet (10 mg total) by mouth daily. (Patient not taking: Reported on 12/01/2023) 30 tablet 0   SANTYL 250 UNIT/GM ointment Apply topically daily.     torsemide  (DEMADEX ) 20 MG tablet Take 5 tablets (100 mg total) by mouth 2 (two) times daily. 250 tablet 3   traMADol  (ULTRAM ) 50 MG tablet Take 1 tablet (50 mg total) by mouth every 12 (twelve) hours as needed. 30 tablet 0   No current facility-administered medications for this visit.   Facility-Administered Medications Ordered in Other Visits  Medication Dose Route Frequency Provider Last Rate Last Admin   bevacizumab -adcd (VEGZELMA ) 300 mg in sodium chloride  0.9 % 100 mL chemo infusion  5 mg/kg (Treatment Plan Recorded) Intravenous Once [name], MD       epoetin  alfa-epbx (RETACRIT ) injection 40,000 Units  40,000 Units Subcutaneous Once [name], MD        octreotide  (SANDOSTATIN  LAR) IM injection 20 mg  20 mg Intramuscular Once Burton, Lacie K, NP        PHYSICAL EXAMINATION: ECOG PERFORMANCE STATUS: 3 - Symptomatic, >50% confined to bed  Vitals:   01/07/24 1434  BP: (!) 138/58  Pulse: 62  Resp: 17  Temp: 98.8 F (37.1 C)  SpO2: 95%   Wt Readings from Last 3 Encounters:  01/07/24 120 lb 3.2 oz (54.5 kg)  12/01/23 131 lb (59.4 kg)  11/26/23 132 lb 9.6 oz (60.1 kg)     GENERAL:alert, no distress and comfortable SKIN: (+) Significant hyperpigmentation, erythema, scaling and some necrosis in b/l legs, right leg is wrapped below knee  EYES: normal, Conjunctiva are pink and non-injected, sclera clear NECK: supple, thyroid  normal size, non-tender, without nodularity LYMPH:  no palpable lymphadenopathy in the cervical, axillary  LUNGS: clear to auscultation and percussion with normal breathing effort HEART: regular rate & rhythm and no murmurs and no lower extremity edema ABDOMEN:abdomen soft, non-tender and normal bowel sounds Musculoskeletal:no cyanosis of digits and no clubbing  NEURO: alert & oriented x 3 with fluent speech, no focal motor/sensory deficits  Physical Exam    LABORATORY DATA:  I have reviewed the data as listed    Latest Ref Rng & Units 01/07/2024    1:55 PM 12/22/2023    2:05 PM 12/08/2023   11:45 AM  CBC  WBC 4.0 - 10.5 K/uL 5.2  4.3  4.6   Hemoglobin 12.0 - 15.0 g/dL 9.9  89.8  9.3   Hematocrit 36.0 - 46.0 % 32.6  33.5  31.7   Platelets 150 - 400 K/uL 217  147  194         Latest Ref Rng & Units 11/26/2023    2:59 PM 10/01/2023    3:05 PM 09/17/2023    6:03 AM  CMP  Glucose 70 - 99 mg/dL 75  94  845   BUN 8 - 27 mg/dL 56  54  73   Creatinine 0.57 - 1.00 mg/dL 8.28  7.60  1.71   Sodium 134 - 144 mmol/L 139  138  136   Potassium 3.5 - 5.2 mmol/L 4.6  3.5  4.1   Chloride 96 - 106 mmol/L 97  95  93   CO2 20 - 29 mmol/L 25  33  32   Calcium  8.7 - 10.3 mg/dL 9.2  9.6  9.0   Total Protein 6.5 - 8.1  g/dL  8.1    Total Bilirubin 0.0 - 1.2 mg/dL  0.6    Alkaline Phos 38 - 126 U/L  52    AST 15 - 41 U/L  15    ALT 0 - 44 U/L  6        RADIOGRAPHIC STUDIES: I have personally reviewed the radiological images as listed and agreed with the findings in the report. No results found.    No orders of the defined types were placed in this encounter.  All questions were answered. The patient knows to call the clinic with any problems, questions or concerns. No barriers to learning was detected. The total time spent in the appointment was 40 minutes, including review of chart and various tests results, discussions about plan of care and coordination of care plan     Onita Mattock, MD 01/07/2024

## 2024-01-07 NOTE — Progress Notes (Signed)
 Per Dr. Lanny cancel blood transfusion appt on 09/05

## 2024-01-08 ENCOUNTER — Telehealth: Payer: Self-pay

## 2024-01-08 ENCOUNTER — Other Ambulatory Visit: Payer: Self-pay

## 2024-01-08 NOTE — Telephone Encounter (Signed)
 Called patient at 1230 to check in and discuss xray and swab results. She continues to endorse ongoing pain without improvement on cefdinir . Has not yet gotten in with general surgery.   Advised swab growing MRSA, corynebacterium. Xray results without osteomyelitis but with evidence of soft tissue gas. She is without fevers/red flag signs so suspect this is secondary to skin disruption at site of known ulceration but cannot rule out more concerning causes without evaluation. I discussed the difficulty/limitations of continuing to try and manage this in the outpatient setting and recommended going to the ED for evaluation, consideration of debridement, initiation of IV Abx. Previously requested susceptibilities for corynebacterium but not yet resulted.

## 2024-01-08 NOTE — Telephone Encounter (Signed)
 Dr. Lanny, patient will be scheduled as soon as possible.  Auth Submission: NO AUTH NEEDED Site of care: Site of care: [facility] Payer: [employer] Medication & CPT/J Code(s) submitted: Feraheme  (ferumoxytol ) [id number] Diagnosis Code:  Route of submission (phone, fax, portal):  Phone # Fax # Auth type: Buy/Bill PB Units/visits requested: 510mg  x 2 doses Reference number:  Approval from: 01/08/24 to 04/08/24

## 2024-01-08 NOTE — Telephone Encounter (Signed)
 Called radiology re update on xray read - will read/post today or tomorrow.

## 2024-01-09 ENCOUNTER — Emergency Department

## 2024-01-09 ENCOUNTER — Inpatient Hospital Stay
Admission: EM | Admit: 2024-01-09 | Discharge: 2024-01-26 | DRG: 602 | Disposition: A | Discharge status: Skilled Nursing Facility | Attending: Student | Admitting: Student

## 2024-01-09 ENCOUNTER — Inpatient Hospital Stay

## 2024-01-09 ENCOUNTER — Other Ambulatory Visit: Payer: Self-pay

## 2024-01-09 DIAGNOSIS — I2510 Atherosclerotic heart disease of native coronary artery without angina pectoris: Secondary | ICD-10-CM | POA: Diagnosis present

## 2024-01-09 DIAGNOSIS — I7389 Other specified peripheral vascular diseases: Secondary | ICD-10-CM | POA: Diagnosis present

## 2024-01-09 DIAGNOSIS — L0390 Cellulitis, unspecified: Secondary | ICD-10-CM | POA: Diagnosis not present

## 2024-01-09 DIAGNOSIS — I272 Pulmonary hypertension, unspecified: Secondary | ICD-10-CM | POA: Diagnosis present

## 2024-01-09 DIAGNOSIS — M19042 Primary osteoarthritis, left hand: Secondary | ICD-10-CM | POA: Diagnosis present

## 2024-01-09 DIAGNOSIS — E7800 Pure hypercholesterolemia, unspecified: Secondary | ICD-10-CM | POA: Diagnosis present

## 2024-01-09 DIAGNOSIS — Z8507 Personal history of malignant neoplasm of pancreas: Secondary | ICD-10-CM

## 2024-01-09 DIAGNOSIS — E440 Moderate protein-calorie malnutrition: Secondary | ICD-10-CM | POA: Diagnosis not present

## 2024-01-09 DIAGNOSIS — Z604 Social exclusion and rejection: Secondary | ICD-10-CM | POA: Diagnosis present

## 2024-01-09 DIAGNOSIS — S81802D Unspecified open wound, left lower leg, subsequent encounter: Secondary | ICD-10-CM | POA: Diagnosis not present

## 2024-01-09 DIAGNOSIS — D696 Thrombocytopenia, unspecified: Secondary | ICD-10-CM | POA: Diagnosis present

## 2024-01-09 DIAGNOSIS — M85862 Other specified disorders of bone density and structure, left lower leg: Secondary | ICD-10-CM | POA: Diagnosis not present

## 2024-01-09 DIAGNOSIS — N184 Chronic kidney disease, stage 4 (severe): Secondary | ICD-10-CM | POA: Diagnosis not present

## 2024-01-09 DIAGNOSIS — Z79891 Long term (current) use of opiate analgesic: Secondary | ICD-10-CM

## 2024-01-09 DIAGNOSIS — Z95810 Presence of automatic (implantable) cardiac defibrillator: Secondary | ICD-10-CM | POA: Diagnosis present

## 2024-01-09 DIAGNOSIS — S81802A Unspecified open wound, left lower leg, initial encounter: Secondary | ICD-10-CM | POA: Diagnosis not present

## 2024-01-09 DIAGNOSIS — N1832 Chronic kidney disease, stage 3b: Secondary | ICD-10-CM | POA: Diagnosis present

## 2024-01-09 DIAGNOSIS — I739 Peripheral vascular disease, unspecified: Secondary | ICD-10-CM | POA: Diagnosis present

## 2024-01-09 DIAGNOSIS — L97929 Non-pressure chronic ulcer of unspecified part of left lower leg with unspecified severity: Secondary | ICD-10-CM | POA: Diagnosis not present

## 2024-01-09 DIAGNOSIS — I872 Venous insufficiency (chronic) (peripheral): Secondary | ICD-10-CM | POA: Diagnosis not present

## 2024-01-09 DIAGNOSIS — Z7401 Bed confinement status: Secondary | ICD-10-CM | POA: Diagnosis not present

## 2024-01-09 DIAGNOSIS — M6281 Muscle weakness (generalized): Secondary | ICD-10-CM | POA: Diagnosis not present

## 2024-01-09 DIAGNOSIS — M79641 Pain in right hand: Secondary | ICD-10-CM | POA: Diagnosis not present

## 2024-01-09 DIAGNOSIS — E876 Hypokalemia: Secondary | ICD-10-CM | POA: Diagnosis present

## 2024-01-09 DIAGNOSIS — I2729 Other secondary pulmonary hypertension: Secondary | ICD-10-CM | POA: Diagnosis not present

## 2024-01-09 DIAGNOSIS — L03116 Cellulitis of left lower limb: Principal | ICD-10-CM | POA: Diagnosis present

## 2024-01-09 DIAGNOSIS — I5033 Acute on chronic diastolic (congestive) heart failure: Secondary | ICD-10-CM | POA: Diagnosis not present

## 2024-01-09 DIAGNOSIS — J9611 Chronic respiratory failure with hypoxia: Secondary | ICD-10-CM | POA: Diagnosis not present

## 2024-01-09 DIAGNOSIS — R2689 Other abnormalities of gait and mobility: Secondary | ICD-10-CM | POA: Diagnosis not present

## 2024-01-09 DIAGNOSIS — D849 Immunodeficiency, unspecified: Secondary | ICD-10-CM | POA: Diagnosis not present

## 2024-01-09 DIAGNOSIS — F05 Delirium due to known physiological condition: Secondary | ICD-10-CM | POA: Diagnosis not present

## 2024-01-09 DIAGNOSIS — A4902 Methicillin resistant Staphylococcus aureus infection, unspecified site: Secondary | ICD-10-CM | POA: Diagnosis not present

## 2024-01-09 DIAGNOSIS — R410 Disorientation, unspecified: Secondary | ICD-10-CM | POA: Diagnosis not present

## 2024-01-09 DIAGNOSIS — B3731 Acute candidiasis of vulva and vagina: Secondary | ICD-10-CM | POA: Diagnosis present

## 2024-01-09 DIAGNOSIS — I421 Obstructive hypertrophic cardiomyopathy: Secondary | ICD-10-CM | POA: Diagnosis present

## 2024-01-09 DIAGNOSIS — L03115 Cellulitis of right lower limb: Secondary | ICD-10-CM | POA: Diagnosis present

## 2024-01-09 DIAGNOSIS — L97919 Non-pressure chronic ulcer of unspecified part of right lower leg with unspecified severity: Secondary | ICD-10-CM | POA: Diagnosis not present

## 2024-01-09 DIAGNOSIS — T368X5A Adverse effect of other systemic antibiotics, initial encounter: Secondary | ICD-10-CM | POA: Diagnosis not present

## 2024-01-09 DIAGNOSIS — Z886 Allergy status to analgesic agent status: Secondary | ICD-10-CM

## 2024-01-09 DIAGNOSIS — M7989 Other specified soft tissue disorders: Secondary | ICD-10-CM | POA: Diagnosis not present

## 2024-01-09 DIAGNOSIS — I5040 Unspecified combined systolic (congestive) and diastolic (congestive) heart failure: Secondary | ICD-10-CM | POA: Diagnosis not present

## 2024-01-09 DIAGNOSIS — I110 Hypertensive heart disease with heart failure: Secondary | ICD-10-CM | POA: Diagnosis not present

## 2024-01-09 DIAGNOSIS — J449 Chronic obstructive pulmonary disease, unspecified: Secondary | ICD-10-CM | POA: Diagnosis not present

## 2024-01-09 DIAGNOSIS — I10 Essential (primary) hypertension: Secondary | ICD-10-CM | POA: Diagnosis present

## 2024-01-09 DIAGNOSIS — Z91048 Other nonmedicinal substance allergy status: Secondary | ICD-10-CM

## 2024-01-09 DIAGNOSIS — Z85828 Personal history of other malignant neoplasm of skin: Secondary | ICD-10-CM

## 2024-01-09 DIAGNOSIS — I5032 Chronic diastolic (congestive) heart failure: Secondary | ICD-10-CM | POA: Diagnosis present

## 2024-01-09 DIAGNOSIS — N179 Acute kidney failure, unspecified: Secondary | ICD-10-CM | POA: Diagnosis present

## 2024-01-09 DIAGNOSIS — Z7901 Long term (current) use of anticoagulants: Secondary | ICD-10-CM

## 2024-01-09 DIAGNOSIS — Z8616 Personal history of COVID-19: Secondary | ICD-10-CM

## 2024-01-09 DIAGNOSIS — Z681 Body mass index (BMI) 19 or less, adult: Secondary | ICD-10-CM | POA: Diagnosis not present

## 2024-01-09 DIAGNOSIS — Z905 Acquired absence of kidney: Secondary | ICD-10-CM | POA: Diagnosis not present

## 2024-01-09 DIAGNOSIS — I4821 Permanent atrial fibrillation: Secondary | ICD-10-CM | POA: Diagnosis not present

## 2024-01-09 DIAGNOSIS — Z22322 Carrier or suspected carrier of Methicillin resistant Staphylococcus aureus: Secondary | ICD-10-CM

## 2024-01-09 DIAGNOSIS — M19041 Primary osteoarthritis, right hand: Secondary | ICD-10-CM | POA: Diagnosis present

## 2024-01-09 DIAGNOSIS — Z87891 Personal history of nicotine dependence: Secondary | ICD-10-CM

## 2024-01-09 DIAGNOSIS — Z66 Do not resuscitate: Secondary | ICD-10-CM | POA: Diagnosis not present

## 2024-01-09 DIAGNOSIS — D61818 Other pancytopenia: Secondary | ICD-10-CM | POA: Diagnosis not present

## 2024-01-09 DIAGNOSIS — D500 Iron deficiency anemia secondary to blood loss (chronic): Secondary | ICD-10-CM | POA: Diagnosis present

## 2024-01-09 DIAGNOSIS — G8929 Other chronic pain: Secondary | ICD-10-CM | POA: Diagnosis present

## 2024-01-09 DIAGNOSIS — E43 Unspecified severe protein-calorie malnutrition: Secondary | ICD-10-CM | POA: Diagnosis not present

## 2024-01-09 DIAGNOSIS — I080 Rheumatic disorders of both mitral and aortic valves: Secondary | ICD-10-CM | POA: Diagnosis not present

## 2024-01-09 DIAGNOSIS — E871 Hypo-osmolality and hyponatremia: Secondary | ICD-10-CM | POA: Diagnosis present

## 2024-01-09 DIAGNOSIS — M79606 Pain in leg, unspecified: Secondary | ICD-10-CM

## 2024-01-09 DIAGNOSIS — B9562 Methicillin resistant Staphylococcus aureus infection as the cause of diseases classified elsewhere: Secondary | ICD-10-CM | POA: Diagnosis present

## 2024-01-09 DIAGNOSIS — E785 Hyperlipidemia, unspecified: Secondary | ICD-10-CM | POA: Diagnosis present

## 2024-01-09 DIAGNOSIS — L819 Disorder of pigmentation, unspecified: Secondary | ICD-10-CM | POA: Diagnosis not present

## 2024-01-09 DIAGNOSIS — Z85528 Personal history of other malignant neoplasm of kidney: Secondary | ICD-10-CM

## 2024-01-09 DIAGNOSIS — Z8249 Family history of ischemic heart disease and other diseases of the circulatory system: Secondary | ICD-10-CM

## 2024-01-09 DIAGNOSIS — Z888 Allergy status to other drugs, medicaments and biological substances status: Secondary | ICD-10-CM

## 2024-01-09 DIAGNOSIS — D3A8 Other benign neuroendocrine tumors: Secondary | ICD-10-CM | POA: Diagnosis present

## 2024-01-09 DIAGNOSIS — C254 Malignant neoplasm of endocrine pancreas: Secondary | ICD-10-CM | POA: Diagnosis not present

## 2024-01-09 DIAGNOSIS — Z90710 Acquired absence of both cervix and uterus: Secondary | ICD-10-CM

## 2024-01-09 DIAGNOSIS — Z885 Allergy status to narcotic agent status: Secondary | ICD-10-CM

## 2024-01-09 DIAGNOSIS — I130 Hypertensive heart and chronic kidney disease with heart failure and stage 1 through stage 4 chronic kidney disease, or unspecified chronic kidney disease: Secondary | ICD-10-CM | POA: Diagnosis present

## 2024-01-09 DIAGNOSIS — Z9981 Dependence on supplemental oxygen: Secondary | ICD-10-CM

## 2024-01-09 DIAGNOSIS — Z881 Allergy status to other antibiotic agents status: Secondary | ICD-10-CM

## 2024-01-09 DIAGNOSIS — D649 Anemia, unspecified: Secondary | ICD-10-CM | POA: Diagnosis present

## 2024-01-09 DIAGNOSIS — Z5329 Procedure and treatment not carried out because of patient's decision for other reasons: Secondary | ICD-10-CM | POA: Diagnosis not present

## 2024-01-09 DIAGNOSIS — Z79899 Other long term (current) drug therapy: Secondary | ICD-10-CM

## 2024-01-09 DIAGNOSIS — R001 Bradycardia, unspecified: Secondary | ICD-10-CM | POA: Diagnosis not present

## 2024-01-09 DIAGNOSIS — M869 Osteomyelitis, unspecified: Secondary | ICD-10-CM | POA: Diagnosis not present

## 2024-01-09 DIAGNOSIS — S81801A Unspecified open wound, right lower leg, initial encounter: Secondary | ICD-10-CM | POA: Diagnosis not present

## 2024-01-09 DIAGNOSIS — Z96643 Presence of artificial hip joint, bilateral: Secondary | ICD-10-CM | POA: Diagnosis present

## 2024-01-09 DIAGNOSIS — I5082 Biventricular heart failure: Secondary | ICD-10-CM | POA: Diagnosis present

## 2024-01-09 DIAGNOSIS — Z9049 Acquired absence of other specified parts of digestive tract: Secondary | ICD-10-CM

## 2024-01-09 DIAGNOSIS — I509 Heart failure, unspecified: Secondary | ICD-10-CM | POA: Diagnosis not present

## 2024-01-09 DIAGNOSIS — R600 Localized edema: Secondary | ICD-10-CM | POA: Diagnosis not present

## 2024-01-09 LAB — CBC
HCT: 32 % — ABNORMAL LOW (ref 36.0–46.0)
HCT: 30.8 % — ABNORMAL LOW (ref 36.0–46.0)
Hemoglobin: 9.5 g/dL — ABNORMAL LOW (ref 12.0–15.0)
Hemoglobin: 9.2 g/dL — ABNORMAL LOW (ref 12.0–15.0)
MCH: 24 pg — ABNORMAL LOW (ref 26.0–34.0)
MCH: 24.3 pg — ABNORMAL LOW (ref 26.0–34.0)
MCHC: 29.7 g/dL — ABNORMAL LOW (ref 30.0–36.0)
MCHC: 29.9 g/dL — ABNORMAL LOW (ref 30.0–36.0)
MCV: 80.8 fL (ref 80.0–100.0)
MCV: 81.3 fL (ref 80.0–100.0)
Platelets: 208 K/uL (ref 150–400)
Platelets: 196 K/uL (ref 150–400)
RBC: 3.96 MIL/uL (ref 3.87–5.11)
RBC: 3.79 MIL/uL — ABNORMAL LOW (ref 3.87–5.11)
RDW: 20.1 % — ABNORMAL HIGH (ref 11.5–15.5)
RDW: 20.2 % — ABNORMAL HIGH (ref 11.5–15.5)
WBC: 5 K/uL (ref 4.0–10.5)
WBC: 6.4 K/uL (ref 4.0–10.5)
nRBC: 0 % (ref 0.0–0.2)
nRBC: 0 % (ref 0.0–0.2)

## 2024-01-09 LAB — BASIC METABOLIC PANEL WITH GFR
Anion gap: 15 (ref 5–15)
BUN: 65 mg/dL — ABNORMAL HIGH (ref 8–23)
CO2: 28 mmol/L (ref 22–32)
Calcium: 9.3 mg/dL (ref 8.9–10.3)
Chloride: 94 mmol/L — ABNORMAL LOW (ref 98–111)
Creatinine, Ser: 1.83 mg/dL — ABNORMAL HIGH (ref 0.44–1.00)
GFR, Estimated: 26 mL/min — ABNORMAL LOW (ref >60)
Glucose, Bld: 144 mg/dL — ABNORMAL HIGH (ref 70–99)
Potassium: 3.4 mmol/L — ABNORMAL LOW (ref 3.5–5.1)
Sodium: 137 mmol/L (ref 135–145)

## 2024-01-09 LAB — DIFFERENTIAL
Abs Immature Granulocytes: 0.02 K/uL (ref 0.00–0.07)
Basophils Absolute: 0 K/uL (ref 0.0–0.1)
Basophils Relative: 1 %
Eosinophils Absolute: 0 K/uL (ref 0.0–0.5)
Eosinophils Relative: 0 %
Immature Granulocytes: 0 %
Lymphocytes Relative: 12 %
Lymphs Abs: 0.6 K/uL — ABNORMAL LOW (ref 0.7–4.0)
Monocytes Absolute: 0.6 K/uL (ref 0.1–1.0)
Monocytes Relative: 12 %
Neutro Abs: 3.8 K/uL (ref 1.7–7.7)
Neutrophils Relative %: 75 %

## 2024-01-09 LAB — HEPATIC FUNCTION PANEL
ALT: 8 U/L (ref 0–44)
AST: 21 U/L (ref 15–41)
Albumin: 4 g/dL (ref 3.5–5.0)
Alkaline Phosphatase: 56 U/L (ref 38–126)
Bilirubin, Direct: 0.1 mg/dL (ref 0.0–0.2)
Indirect Bilirubin: 0.7 mg/dL (ref 0.3–0.9)
Total Bilirubin: 0.8 mg/dL (ref 0.0–1.2)
Total Protein: 8.3 g/dL — ABNORMAL HIGH (ref 6.5–8.1)

## 2024-01-09 LAB — LACTIC ACID, PLASMA: Lactic Acid, Venous: 1.8 mmol/L (ref 0.5–1.9)

## 2024-01-09 LAB — PROCALCITONIN: Procalcitonin: <0.1 ng/mL

## 2024-01-09 LAB — CREATININE, SERUM
Creatinine, Ser: 2.22 mg/dL — ABNORMAL HIGH (ref 0.44–1.00)
GFR, Estimated: 21 mL/min — ABNORMAL LOW (ref >60)

## 2024-01-09 LAB — LIPASE, BLOOD: Lipase: 56 U/L — ABNORMAL HIGH (ref 11–51)

## 2024-01-09 MED ORDER — ACETAMINOPHEN 325 MG PO TABS
975.0000 mg | ORAL_TABLET | Freq: Once | ORAL | Status: AC
  Administered 2024-01-09: 975 mg via ORAL
  Filled 2024-01-09: qty 3

## 2024-01-09 MED ORDER — SODIUM CHLORIDE 0.9 % IV SOLN
2.0000 g | INTRAVENOUS | Status: DC
  Administered 2024-01-09 – 2024-01-12 (×4): 2 g via INTRAVENOUS
  Filled 2024-01-09 (×4): qty 12.5

## 2024-01-09 MED ORDER — ENSURE PLUS HIGH PROTEIN PO LIQD
237.0000 mL | Freq: Two times a day (BID) | ORAL | Status: DC
  Administered 2024-01-11 – 2024-01-12 (×3): 237 mL via ORAL

## 2024-01-09 MED ORDER — LINEZOLID 600 MG/300ML IV SOLN
600.0000 mg | Freq: Two times a day (BID) | INTRAVENOUS | Status: DC
  Administered 2024-01-09 – 2024-01-13 (×8): 600 mg via INTRAVENOUS
  Filled 2024-01-09 (×9): qty 300

## 2024-01-09 MED ORDER — SODIUM CHLORIDE 0.9 % IV BOLUS
500.0000 mL | Freq: Once | INTRAVENOUS | Status: AC
  Administered 2024-01-09: 500 mL via INTRAVENOUS

## 2024-01-09 MED ORDER — TRAMADOL HCL 50 MG PO TABS
50.0000 mg | ORAL_TABLET | Freq: Once | ORAL | Status: AC
  Administered 2024-01-09: 50 mg via ORAL
  Filled 2024-01-09: qty 1

## 2024-01-09 MED ORDER — ENOXAPARIN SODIUM 30 MG/0.3ML IJ SOSY
30.0000 mg | PREFILLED_SYRINGE | INTRAMUSCULAR | Status: DC
  Administered 2024-01-09 – 2024-01-21 (×13): 30 mg via SUBCUTANEOUS
  Filled 2024-01-09 (×13): qty 0.3

## 2024-01-09 MED ORDER — DEXTROSE-SODIUM CHLORIDE 5-0.9 % IV SOLN: INTRAVENOUS | Status: DC

## 2024-01-09 MED ORDER — ONDANSETRON HCL 4 MG/2ML IJ SOLN
4.0000 mg | Freq: Four times a day (QID) | INTRAMUSCULAR | Status: DC | PRN
  Administered 2024-01-15: 4 mg via INTRAVENOUS
  Filled 2024-01-09: qty 2

## 2024-01-09 MED ORDER — ONDANSETRON HCL 4 MG PO TABS
4.0000 mg | ORAL_TABLET | Freq: Four times a day (QID) | ORAL | Status: DC | PRN
  Administered 2024-01-10 – 2024-01-23 (×4): 4 mg via ORAL
  Filled 2024-01-09 (×4): qty 1

## 2024-01-09 NOTE — ED Triage Notes (Signed)
 Pt comes with mrsa in skin. Pt has open wounds noted to lower bilateral legs with redness and swelling.  Pt states she is cancer pt also. Pt states she has been going to wound clinic.   Pt wears 2L [place] chronically.

## 2024-01-09 NOTE — ED Notes (Signed)
 [place] at the bedside

## 2024-01-09 NOTE — ED Notes (Signed)
 Unable to obtain 2nd set of BC due to difficult stick.

## 2024-01-09 NOTE — ED Notes (Signed)
 Pt assisted to bedside commode. Pt placed in hospital gown. Pt's belongings placed in belongings bag and left at bedside with pt. Pt requesting purewick due to painful ambulation due to wound to lower left leg and foot. Call bell in reach. Pt has no other requests or needs at this time.

## 2024-01-09 NOTE — ED Provider Notes (Signed)
 [facility] Provider Note    Event Date/Time   First MD Initiated Contact with Patient 01/09/24 1752     (approximate)   History   Wound Infection   HPI  [name] is a 89 y.o. 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  Patient reports a long history of wounds on her lower extremity managed by wound clinic and dermatology.  She noticed increased pain approximately 1 week ago and has been on outpatient cefdinir .  Her dermatologist took a swab of the wound and it has grown out MRSA and corynebacterium.  Patient reports progressively worsening pain but states that she cannot take regular pain meds including morphine  and Dilaudid  and is only able to take tramadol  and Tylenol .  Patient presents with her daughter who helps contribute to the history     Physical Exam   Triage Vital Signs: ED Triage Vitals  Encounter Vitals Group     BP 01/09/24 1416 128/74     Girls Systolic BP Percentile --      Girls Diastolic BP Percentile --      Boys Systolic BP Percentile --      Boys Diastolic BP Percentile --      Pulse Rate 01/09/24 1416 60     Resp 01/09/24 1416 19     Temp 01/09/24 1416 98 F (36.7 C)     Temp src --      SpO2 01/09/24 1416 92 %     Weight --      Height --      Head Circumference --      Peak Flow --      Pain Score 01/09/24 1415 10     Pain Loc --      Pain Education --      Exclude from Growth Chart --     Most recent vital signs: Vitals:   01/09/24 1416 01/09/24 1900  BP: 128/74 136/66  Pulse: 60 61  Resp: 19 19  Temp: 98 F (36.7 C)   SpO2: 92% 100%    Nursing Triage Note reviewed. Vital signs reviewed and patients oxygen  saturation is  normoxic On home oxygen   General: Patient is thin, well developed, awake and alert, appears uncomfortable Head: Normocephalic and atraumatic Eyes: Normal inspection, extraocular muscles intact, no conjunctival pallor Ear, nose, throat: Normal external exam Neck: Normal range of motion Respiratory: Patient is in no respiratory distress, lungs with mild rhonchi Cardiovascular: Patient is not tachycardic, RR GI: Abd SNT with no guarding or rebound  Back: Normal inspection of the back with good strength and range of motion throughout all ext Neuro: The patient is alert and oriented to person, place, and time, appropriately conversive, with 5/5 bilat UE/LE strength, no gross motor or sensory defects noted. Coordination appears to be adequate. Skin:  Psych: normal mood and affect, no SI or HI  ED Results / Procedures / Treatments   Labs (all labs ordered are listed, but only abnormal results are displayed) Labs Reviewed  CBC - Abnormal; Notable for the following components:      Result Value   Hemoglobin 9.5 (*)    HCT 32.0 (*)    MCH 24.0 (*)    MCHC 29.7 (*)  RDW 20.1 (*)    All other components within normal limits  BASIC METABOLIC PANEL WITH GFR - Abnormal; Notable for the following components:   Potassium 3.4 (*)    Chloride 94 (*)    Glucose, Bld 144 (*)    BUN 65 (*)    Creatinine, Ser 1.83 (*)    GFR, Estimated 26 (*)    All other components within normal limits  CULTURE, BLOOD (ROUTINE X 2)  CULTURE, BLOOD (ROUTINE X 2)  LACTIC ACID, PLASMA  PROCALCITONIN  LIPASE, BLOOD  HEPATIC FUNCTION PANEL  DIFFERENTIAL  CBC  CREATININE, SERUM  CBC  COMPREHENSIVE METABOLIC PANEL WITH GFR     EKG None  RADIOLOGY Xray tibia: No gas tracking on my independent review interpretation radiologist agrees    PROCEDURES:  Critical Care performed: No  Procedures   MEDICATIONS ORDERED IN ED: Medications  linezolid  (ZYVOX ) IVPB 600 mg (has no administration in time  range)  enoxaparin  (LOVENOX ) injection 30 mg (has no administration in time range)  dextrose  5 %-0.9 % sodium chloride  infusion (has no administration in time range)  ondansetron  (ZOFRAN ) tablet 4 mg (has no administration in time range)    Or  ondansetron  (ZOFRAN ) injection 4 mg (has no administration in time range)  traMADol  (ULTRAM ) tablet 50 mg (50 mg Oral Given 01/09/24 1821)  acetaminophen  (TYLENOL ) tablet 975 mg (975 mg Oral Given 01/09/24 1821)  sodium chloride  0.9 % bolus 500 mL (500 mLs Intravenous New Bag/Given 01/09/24 1830)     IMPRESSION / MDM / ASSESSMENT AND PLAN / ED COURSE                                Differential diagnosis includes, but is not limited to, cellulitis, necrotizing infection, pyogenic infection, bacteremia   ED course: Patient's lower extremity does appear very consistent with cellulitis.  I do not palpate any crepitus on x-ray does not seem consistent with necrotizing infection.  She does not have a leukocytosis however is immunocompromise but reassured the lactic acid is not elevated.  I reviewed the dermatology short note and the recommendations and have initiated the patient on antibiotic.  Case discussed with hospitalist for admission.  Patient updated and agreeable   Clinical Course as of 01/09/24 1911  Fri Jan 09, 2024  1751 HCT(!): 32.0 [HD]  1846 DG Tibia/Fibula Left No evidence of necrotizing infection [HD]  1846 Creatinine(!): 1.83 Patient does have CKD.  Will avoid vancomycin  at this time [HD]  1856 Case discussed with hospitalist for admission [HD]    Clinical Course User Index [HD] Nicholaus Rolland BRAVO, MD     FINAL CLINICAL IMPRESSION(S) / ED DIAGNOSES   Final diagnoses:  Cellulitis of left lower extremity  Immunocompromised (HCC)  Pain of lower extremity, unspecified laterality  MRSA (methicillin resistant staph aureus) culture positive     Rx / DC Orders   ED Discharge Orders     None        Note:  This document was  prepared using Dragon voice recognition software and may include unintentional dictation errors.   Nicholaus Rolland BRAVO, MD 01/09/24 [id number]

## 2024-01-09 NOTE — H&P (Signed)
 History and Physical    Patient: [name] FMW:[id number] DOB: 1933/08/21 DOA: 01/09/2024 DOS: the patient was seen and examined on 01/09/2024 PCP: [name], MD  Patient coming from: Home  Chief Complaint:  Chief Complaint  Patient presents with   Wound Infection   HPI: [name] is a 89 y.o. female with medical history significant of peripheral vascular disease, heart failure with preserved ejection fraction, chronic kidney failure stage III, history of nephrectomy, pulmonary hypertension, chronic stasis dermatitis, essential hypertension, COPD, atrial fibrillation, squamous cell carcinoma of the skin, hypertrophic cardiomyopathy, hyperlipidemia, normocytic anemia, chronic cellulitis of lower extremity and left lower extremity wound who goes to wound care center and also followed by dermatologist.  Patient has 2 lower extremity ulcers that are enlarging and not healing well.  Patient has been followed by dermatologist and has been on antibiotics and.  Cultures were obtained that grew MRSA and corynebacterium.  Patient was sent over to the hospital for IV antibiotics.  Patient also will continue with wound care.  She is on chronic home O2.  Patient also has significant allergies to narcotics.  Complaining of 8 out of 10 pain.  Review of Systems: As mentioned in the history of present illness. All other systems reviewed and are negative. Past Medical History:  Diagnosis Date   (HFpEF) heart failure with preserved ejection fraction (HCC)    EF=60-60%   Actinic keratosis 01/17/2015   R forearm   Adjustment disorder with anxiety    Adverse effect of other narcotics, sequela    Intolerance to all narcotics   Anti-Duffy antibodies present    Aortic valve stenosis    Arthritis    some in my hands (11/11/2012)   Atrial fibrillation, permanent (HCC)    Eliquis    Atypical mole 03/25/2018   L forearm - severe   Automatic implantable cardioverter-defibrillator in situ     Avascular necrosis of hip (HCC) 05/03/2011   Carotid artery stenosis 09/2007   a. 09/2007: 60-79% bilateral (stable); b. 10/2008: 40-59% R 60-79%    Cellulitis of left lower extremity 07/13/2020   CKD (chronic kidney disease), stage III (HCC)    COPD (chronic obstructive pulmonary disease) (HCC)    Coronary artery disease    non-obstructive by 2006 cath   COVID-19 virus infection 02/12/2022   Displaced fracture of left femoral neck (HCC) 10/09/2018   GI bleed 03/28/2020   AVM   High cholesterol    Hypertension 05/20/2011   Hypertr obst cardiomyop    Hypotension, unspecified    cardiac cath 2006..nonobstructive CAD 30-40s lesions.SABRAETT 1/09 nondiagnostic due to poor HR response..Right Renal Cancer 2003   Iron  deficiency anemia    Long term (current) use of anticoagulants    On home oxygen  therapy    3L [place]   Osteoarthritis of right hip    PAD (peripheral artery disease) (HCC)    Pancreatic cancer (HCC)    sandostatin    PONV (postoperative nausea and vomiting)    Presence of permanent cardiac pacemaker    Pulmonary HTN (HCC)    RECTAL BLEEDING 10/13/2009   Qualifier: Diagnosis of  By: Kerman NP, Paula     Red blood cell antibody positive with compatible PRBC difficult to obtain    Anti FYA (Duffy a) antibody. Must be transfused with PRB which are Duffy Antigen Negative and Crossmatch Compatible   Renal cell carcinoma (HCC) 2005   s/p right nephrectomy   Squamous cell carcinoma of skin 01/17/2015   R lat wrist  Squamous cell carcinoma of skin 01/29/2018   R post upper leg - superficially invasive   Squamous cell carcinoma of skin 03/25/2018   L lat foot   Squamous cell carcinoma of skin 11/09/2020   left lat foot - EDC 01/01/21, recurrent 01/31/21 - MOHs 02/22/21   Squamous cell carcinoma of skin 12/19/2021   Right Posterior Medial Thigh, EDC   Squamous cell carcinoma of skin 12/19/2021   SCC IS, L lat heel, EDC 02/04/2022   Squamous cell carcinoma of skin 01/21/2022   L  forearm, EDC 02/04/2022   Squamous cell carcinoma of skin 01/21/2022   R lower leg below knee, EDC 02/04/2022   Squamous cell carcinoma of skin 01/21/2022   SCCIS, R post heel, EDC 02/04/2022   Squamous cell carcinoma of skin 07/01/2022   left lower abdomen, in situ, EDC   Squamous cell carcinoma of skin 07/01/2022   left medial chest, EDC   Urge incontinence    Venous stasis of both lower extremities    Past Surgical History:  Procedure Laterality Date   ABDOMINAL AORTOGRAM W/LOWER EXTREMITY N/A 02/12/2021   Procedure: ABDOMINAL AORTOGRAM W/LOWER EXTREMITY;  Surgeon: Sheree Penne Bruckner, MD;  Location: [facility] INVASIVE CV LAB;  Service: Cardiovascular;  Laterality: N/A;   ABDOMINAL HYSTERECTOMY  1975   for benign causes   APPENDECTOMY     BI-VENTRICULAR PACEMAKER UPGRADE  05/04/2010   BIOPSY  02/28/2019   Procedure: BIOPSY;  Surgeon: Teressa Toribio SQUIBB, MD;  Location: [facility] ENDOSCOPY;  Service: Endoscopy;;   BIOPSY  03/04/2022   Procedure: BIOPSY;  Surgeon: Wilhelmenia Aloha Raddle., MD;  Location: [facility];  Service: Gastroenterology;;   CARDIAC CATHETERIZATION  2006   CARDIOVERSION N/A 02/20/2018   Procedure: CARDIOVERSION;  Surgeon: [name], MD;  Location: [facility] ORS;  Service: Cardiovascular;  Laterality: N/A;   CARDIOVERSION N/A 03/27/2018   Procedure: CARDIOVERSION;  Surgeon: [name], MD;  Location: [facility] ORS;  Service: Cardiovascular;  Laterality: N/A;   CATARACT EXTRACTION W/ INTRAOCULAR LENS  IMPLANT, BILATERAL  01/2006-02-2006   CHOLECYSTECTOMY N/A 11/11/2012   Procedure: LAPAROSCOPIC CHOLECYSTECTOMY WITH INTRAOPERATIVE CHOLANGIOGRAM;  Surgeon: Donnice POUR. Belinda, MD;  Location: [facility] OR;  Service: General;  Laterality: N/A;   COLONOSCOPY WITH PROPOFOL  N/A 02/28/2019   Procedure: COLONOSCOPY WITH PROPOFOL ;  Surgeon: Teressa Toribio SQUIBB, MD;  Location: [facility] ENDOSCOPY;  Service: Endoscopy;  Laterality: N/A;   ENTEROSCOPY N/A 03/30/2020   Procedure: ENTEROSCOPY;  Surgeon:  San Sandor GAILS, DO;  Location: [facility] ENDOSCOPY;  Service: Gastroenterology;  Laterality: N/A;   ENTEROSCOPY N/A 02/10/2021   Procedure: ENTEROSCOPY;  Surgeon: Rollin Dover, MD;  Location: [facility] ENDOSCOPY;  Service: Endoscopy;  Laterality: N/A;   ENTEROSCOPY N/A 02/15/2022   Procedure: ENTEROSCOPY;  Surgeon: Federico Rosario BROCKS, MD;  Location: [facility] ENDOSCOPY;  Service: Gastroenterology;  Laterality: N/A;   ENTEROSCOPY N/A 11/06/2022   Procedure: ENTEROSCOPY;  Surgeon: Wilhelmenia Aloha Raddle., MD;  Location: [facility] ENDOSCOPY;  Service: Gastroenterology;  Laterality: N/A;   EP IMPLANTABLE DEVICE N/A 02/21/2016   Procedure: ICD Generator Changeout;  Surgeon: Elspeth BROCKS Sage, MD;  Location: [facility] INVASIVE CV LAB;  Service: Cardiovascular;  Laterality: N/A;   ESOPHAGOGASTRODUODENOSCOPY N/A 03/04/2022   Procedure: ESOPHAGOGASTRODUODENOSCOPY (EGD);  Surgeon: Wilhelmenia Aloha Raddle., MD;  Location: [facility];  Service: Gastroenterology;  Laterality: N/A;   ESOPHAGOGASTRODUODENOSCOPY (EGD) WITH PROPOFOL  N/A 02/28/2019   Procedure: ESOPHAGOGASTRODUODENOSCOPY (EGD) WITH PROPOFOL ;  Surgeon: Teressa Toribio SQUIBB, MD;  Location: [facility] ENDOSCOPY;  Service: Endoscopy;  Laterality: N/A;   EUS N/A  03/04/2022   Procedure: UPPER ENDOSCOPIC ULTRASOUND (EUS) RADIAL;  Surgeon: Wilhelmenia Aloha Raddle., MD;  Location: [facility] ENDOSCOPY;  Service: Gastroenterology;  Laterality: N/A;   FINE NEEDLE ASPIRATION N/A 03/04/2022   Procedure: FINE NEEDLE ASPIRATION (FNA) LINEAR;  Surgeon: Wilhelmenia Aloha Raddle., MD;  Location: [facility] ENDOSCOPY;  Service: Gastroenterology;  Laterality: N/A;   GIVENS CAPSULE STUDY N/A 03/15/2019   Procedure: GIVENS CAPSULE STUDY;  Surgeon: Eda Iha, MD;  Location: [facility] ENDOSCOPY;  Service: Gastroenterology;  Laterality: N/A;   HOT HEMOSTASIS N/A 03/30/2020   Procedure: HOT HEMOSTASIS (ARGON PLASMA COAGULATION/BICAP);  Surgeon: San Sandor GAILS, DO;  Location: [facility] ENDOSCOPY;  Service: Gastroenterology;  Laterality: N/A;    HOT HEMOSTASIS N/A 02/10/2021   Procedure: HOT HEMOSTASIS (ARGON PLASMA COAGULATION/BICAP);  Surgeon: Rollin Dover, MD;  Location: [facility] ENDOSCOPY;  Service: Endoscopy;  Laterality: N/A;   HOT HEMOSTASIS N/A 02/15/2022   Procedure: HOT HEMOSTASIS (ARGON PLASMA COAGULATION/BICAP);  Surgeon: Federico Rosario BROCKS, MD;  Location: [facility] ENDOSCOPY;  Service: Gastroenterology;  Laterality: N/A;   HOT HEMOSTASIS N/A 11/06/2022   Procedure: HOT HEMOSTASIS (ARGON PLASMA COAGULATION/BICAP);  Surgeon: Wilhelmenia Aloha Raddle., MD;  Location: [facility] ENDOSCOPY;  Service: Gastroenterology;  Laterality: N/A;   INSERT / REPLACE / REMOVE PACEMAKER  05-01-11   02-28-05-/05-04-10-ICD-MEDTRONIC MAXIMAL DR   IR ANGIOGRAM SELECTIVE EACH ADDITIONAL VESSEL  02/18/2022   IR ANGIOGRAM SELECTIVE EACH ADDITIONAL VESSEL  02/18/2022   IR ANGIOGRAM VISCERAL SELECTIVE  02/16/2022   IR ANGIOGRAM VISCERAL SELECTIVE  02/16/2022   IR EMBO ART  VEN HEMORR LYMPH EXTRAV  INC GUIDE ROADMAPPING  02/16/2022   IR THORACENTESIS ASP PLEURAL SPACE W/IMG GUIDE  02/21/2022   IR THORACENTESIS ASP PLEURAL SPACE W/IMG GUIDE  02/22/2022   IR US  GUIDE VASC ACCESS RIGHT  02/16/2022   JOINT REPLACEMENT     LAPAROSCOPIC CHOLECYSTECTOMY  11/11/2012   LAPAROSCOPIC LYSIS OF ADHESIONS N/A 11/11/2012   Procedure: LAPAROSCOPIC LYSIS OF ADHESIONS;  Surgeon: Donnice POUR. Belinda, MD;  Location: [facility] OR;  Service: General;  Laterality: N/A;   NEPHRECTOMY Right 06/2001    S/P RENAL CELL CANCER   PERIPHERAL VASCULAR INTERVENTION Bilateral 02/12/2021   Procedure: PERIPHERAL VASCULAR INTERVENTION;  Surgeon: Sheree Penne Bruckner, MD;  Location: [facility] INVASIVE CV LAB;  Service: Cardiovascular;  Laterality: Bilateral;  Iliac artery stents   PRESSURE SENSOR/CARDIOMEMS N/A 02/03/2019   Procedure: PRESSURE SENSOR/CARDIOMEMS;  Surgeon: [name], MD;  Location: [facility] INVASIVE CV LAB;  Service: Cardiovascular;  Laterality: N/A;   RIGHT HEART CATH N/A 11/09/2018   Procedure: RIGHT HEART CATH;   Surgeon: [name], MD;  Location: [facility] INVASIVE CV LAB;  Service: Cardiovascular;  Laterality: N/A;   RIGHT HEART CATH N/A 03/08/2019   Procedure: RIGHT HEART CATH;  Surgeon: [name], MD;  Location: [facility] INVASIVE CV LAB;  Service: Cardiovascular;  Laterality: N/A;   SUBMUCOSAL TATTOO INJECTION  02/28/2019   Procedure: SUBMUCOSAL TATTOO INJECTION;  Surgeon: Teressa Toribio SQUIBB, MD;  Location: [facility] ENDOSCOPY;  Service: Endoscopy;;   SUBMUCOSAL TATTOO INJECTION  11/06/2022   Procedure: SUBMUCOSAL TATTOO INJECTION;  Surgeon: Wilhelmenia Aloha Raddle., MD;  Location: [facility] ENDOSCOPY;  Service: Gastroenterology;;   TOTAL HIP ARTHROPLASTY Right 05/03/2011   Procedure: TOTAL HIP ARTHROPLASTY ANTERIOR APPROACH;  Surgeon: Bruckner CINDERELLA Poli;  Location: [facility] ORS;  Service: Orthopedics;  Laterality: Right;  Removal of Cannulated Screws Right Hip, Right Direct Anterior Hip Replacement   TOTAL HIP ARTHROPLASTY Left 10/09/2018   Procedure: TOTAL HIP ARTHROPLASTY ANTERIOR APPROACH;  Surgeon: Fidel Rogue, MD;  Location: [facility] OR;  Service: Orthopedics;  Laterality: Left;   Social History:  reports that she quit smoking about 22 years ago. Her smoking use included cigarettes. She started smoking about 62 years ago. She has a 20 pack-year smoking history. She has never used smokeless tobacco. She reports that she does not drink alcohol and does not use drugs.  Allergies  Allergen Reactions   Codeine Nausea And Vomiting and Other (See Comments)    Hallucinations, too- CANNOT HAVE; thinks she may have received Narcan  to reverse the effect   Dilaudid  [Hydromorphone ] Other (See Comments)    Excessive Somnolence- Required Narcan  X2   Tape Other (See Comments)    CAN TOLERATE ONLY EASY-RELEASE, PAPER TAPE, AS THE SKIN IS VERY THIN AND WILL BRUISE AND TEAR VERY EASILY   Amoxil  [Amoxicillin ] Nausea Only   Asa [Aspirin ] Other (See Comments)    Told to avoid due to reduced kidney function, has 1 remaining kidney   Ms  Contin [Morphine ] Nausea And Vomiting and Other (See Comments)    Hallucinations, also   Neurontin  [Gabapentin ] Other (See Comments)    Out of it   Nsaids Other (See Comments)    Told to avoid due to reduced kidney function, has 1 remaining kidney    Family History  Problem Relation Age of Onset   Heart failure Mother    Early death Father        car accident   Breast cancer Maternal Aunt 44   Breast cancer Cousin    Breast cancer Other    Colon cancer Neg Hx    Esophageal cancer Neg Hx    Liver disease Neg Hx    Pancreatic cancer Neg Hx    Rectal cancer Neg Hx     Prior to Admission medications   Medication Sig Start Date End Date Taking? Authorizing Provider  acetaminophen  (TYLENOL ) 500 MG tablet Take 1,000 mg by mouth every 8 (eight) hours.    [provider]  Ascorbic Acid  (VITAMIN C  PO) Take 1 tablet by mouth daily.    [provider]  cefdinir  (OMNICEF ) 250 MG/5ML suspension Take 6 mLs (300 mg total) by mouth daily for 7 days. 01/02/24 01/09/24  Raymund Lauraine BROCKS, MD  Cholecalciferol  (VITAMIN D -3 PO) Take 2 capsules by mouth daily.    [provider]  clotrimazole -betamethasone  (LOTRISONE ) cream Apply topically 2 (two) times daily. 09/08/23   Von Bellis, MD  CRANBERRY PO Take 1 tablet by mouth daily.    [provider]  Cyanocobalamin  (B-12 PO) Take 1 tablet by mouth daily.    [provider]  ferrous gluconate  (FERGON) 324 MG tablet Take 1 tablet (324 mg total) by mouth daily with breakfast. 11/15/22   Rai, Ripudeep K, MD  fluticasone  (FLONASE ) 50 MCG/ACT nasal spray Place 2 sprays into both nostrils daily as needed for allergies or rhinitis. 07/04/22   [name], MD  gabapentin  (NEURONTIN ) 100 MG capsule Take 1 capsule (100 mg total) by mouth at bedtime. 12/01/23   [name], MD  metolazone  (ZAROXOLYN ) 2.5 MG tablet Take 1 tablet (2.5 mg total) by mouth 3 (three) times a week. Every Tues., Thurs and Sat. with 40 meq of  potassium 07/25/23   Zenaida Morene PARAS, MD  Multiple Vitamins-Minerals (HAIR SKIN NAILS PO) Take 1 capsule by mouth daily.    [provider]  pantoprazole  (PROTONIX ) 40 MG tablet Take 1 tablet (40 mg total) by mouth daily. 11/09/22   Davia Nydia POUR, MD  Potassium  Chloride ER 20 MEQ TBCR Take 1 tablet (20 mEq total) by mouth daily. 07/24/23 12/01/23  Zenaida Morene PARAS, MD  rosuvastatin  (CRESTOR ) 10 MG tablet Take 1 tablet (10 mg total) by mouth daily. Patient not taking: Reported on 12/01/2023 06/20/23   Maree Hue, MD  SANTYL 250 UNIT/GM ointment Apply topically daily. 10/15/23   [provider]  torsemide  (DEMADEX ) 20 MG tablet Take 5 tablets (100 mg total) by mouth 2 (two) times daily. 07/10/23   [name], MD  traMADol  (ULTRAM ) 50 MG tablet Take 1 tablet (50 mg total) by mouth every 12 (twelve) hours as needed. 01/02/24   [name], MD    Physical Exam: Vitals:   01/09/24 1416 01/09/24 1900  BP: 128/74 136/66  Pulse: 60 61  Resp: 19 19  Temp: 98 F (36.7 C)   SpO2: 92% 100%   Constitutional: Chronically ill looking, NAD, calm, comfortable Eyes: PERRL, lids and conjunctivae normal ENMT: Mucous membranes are moist. Posterior pharynx clear of any exudate or lesions.Normal dentition.  Neck: normal, supple, no masses, no thyromegaly Respiratory: Decreased air entry bilateral with mild expiratory wheezing, no crackles. Normal respiratory effort. No accessory muscle use.  Cardiovascular: Regular rate and rhythm, no murmurs / rubs / gallops. No extremity edema. 2+ pedal pulses. No carotid bruits.  Abdomen: no tenderness, no masses palpated. No hepatosplenomegaly. Bowel sounds positive.  Musculoskeletal: Good range of motion, no joint swelling or tenderness, Skin: Bilateral lower extremity chronic stasis dermatitis with 2 open wounds which are deep about 5 x 6 cm in diameter with crusted base Neurologic: CN 2-12 grossly intact. Sensation intact, DTR normal. Strength  5/5 in all 4.  Psychiatric: Normal judgment and insight. Alert and oriented x 3. Normal mood  Data Reviewed:  Vital signs stable, potassium 3.4, chloride 94, glucose 144, BUN 65 creatinine 1.83.  Lipase is 56.  Lactic acid 1.8.  Hemoglobin 9.5 platelets 208.  Bilateral venous Doppler ultrasound showed no DVT  Assessment and Plan:  #1 infected left lower extremity wound with surrounding cellulitis: Patient has outpatient culture apparently growing MRSA and corynebacterium.  Admit the patient.  Initiate IV Zyvox  with cefepime .  Patient has AKI with CKD 3 so will not use vancomycin .  May require ID consultation in the morning.  #2 chronic diastolic heart failure: Patient has compensated.  Continue to monitor  #3 COPD with chronic respiratory failure: On home oxygen .  Continue.  Continue home treatment  #4 history of pancreatic neuroendocrine cancer: Patient is on chemotherapy.  Continue follow-up with oncologist.  #5 normocytic anemia: Continue to monitor H&H.  #6 hyperlipidemia: Continue with statin  #7 hypertrophic cardiomyopathy: Continue with supportive care.  #8 acute on chronic kidney disease stage III: Renal function is elevated.  Will hydrate and monitor renal function.  #9 coronary artery disease: Status post ICD placement.  Continue to monitor  #10 peripheral vascular disease: Will continue home regimen.  #11 pulmonary hypertension: Continue oxygen  and home regimen    Advance Care Planning:   Code Status: Limited: Do not attempt resuscitation (DNR) -DNR-LIMITED -Do Not Intubate/DNI    Consults: Wound care  Family Communication: No family at bedside  Severity of Illness: The appropriate patient status for this patient is INPATIENT. Inpatient status is judged to be reasonable and necessary in order to provide the required intensity of service to ensure the patient's safety. The patient's presenting symptoms, physical exam findings, and initial radiographic and laboratory  data in the context of their chronic comorbidities is felt  to place them at high risk for further clinical deterioration. Furthermore, it is not anticipated that the patient will be medically stable for discharge from the hospital within 2 midnights of admission.   * I certify that at the point of admission it is my clinical judgment that the patient will require inpatient hospital care spanning beyond 2 midnights from the point of admission due to high intensity of service, high risk for further deterioration and high frequency of surveillance required.*  AuthorBETHA SIM KNOLL, MD 01/09/2024 7:04 PM  For on call review [email or web address].

## 2024-01-09 NOTE — Progress Notes (Signed)
 PHARMACIST - PHYSICIAN COMMUNICATION  CONCERNING:  Enoxaparin  (Lovenox ) for DVT Prophylaxis    RECOMMENDATION: Patient was prescribed enoxaprin 40mg  q24 hours for VTE prophylaxis.   There were no vitals filed for this visit.  There is no height or weight on file to calculate BMI.  Estimated Creatinine Clearance: 18.6 mL/min (A) (by C-G formula based on SCr of 1.83 mg/dL (H)).  Patient is candidate for enoxaparin  30mg  every 24 hours based on CrCl <66ml/min or Weight <45kg  DESCRIPTION: Pharmacy has adjusted enoxaparin  dose per [facility] policy.  Patient is now receiving enoxaparin  30 mg every 24 hours    Damien Napoleon, PharmD Clinical Pharmacist  01/09/2024 7:08 PM

## 2024-01-10 ENCOUNTER — Inpatient Hospital Stay

## 2024-01-10 DIAGNOSIS — S81802A Unspecified open wound, left lower leg, initial encounter: Secondary | ICD-10-CM | POA: Diagnosis not present

## 2024-01-10 LAB — COMPREHENSIVE METABOLIC PANEL WITH GFR
ALT: 12 U/L (ref 0–44)
AST: 27 U/L (ref 15–41)
Albumin: 3.1 g/dL — ABNORMAL LOW (ref 3.5–5.0)
Alkaline Phosphatase: 53 U/L (ref 38–126)
Anion gap: 8 (ref 5–15)
BUN: 60 mg/dL — ABNORMAL HIGH (ref 8–23)
CO2: 27 mmol/L (ref 22–32)
Calcium: 8.4 mg/dL — ABNORMAL LOW (ref 8.9–10.3)
Chloride: 102 mmol/L (ref 98–111)
Creatinine, Ser: 1.78 mg/dL — ABNORMAL HIGH (ref 0.44–1.00)
GFR, Estimated: 27 mL/min — ABNORMAL LOW (ref >60)
Glucose, Bld: 145 mg/dL — ABNORMAL HIGH (ref 70–99)
Potassium: 3.4 mmol/L — ABNORMAL LOW (ref 3.5–5.1)
Sodium: 137 mmol/L (ref 135–145)
Total Bilirubin: 0.6 mg/dL (ref 0.0–1.2)
Total Protein: 6.5 g/dL (ref 6.5–8.1)

## 2024-01-10 LAB — MAGNESIUM: Magnesium: 2.1 mg/dL (ref 1.7–2.4)

## 2024-01-10 LAB — CBC
HCT: 30.1 % — ABNORMAL LOW (ref 36.0–46.0)
Hemoglobin: 9.1 g/dL — ABNORMAL LOW (ref 12.0–15.0)
MCH: 24.1 pg — ABNORMAL LOW (ref 26.0–34.0)
MCHC: 30.2 g/dL (ref 30.0–36.0)
MCV: 79.8 fL — ABNORMAL LOW (ref 80.0–100.0)
Platelets: 186 K/uL (ref 150–400)
RBC: 3.77 MIL/uL — ABNORMAL LOW (ref 3.87–5.11)
RDW: 20.1 % — ABNORMAL HIGH (ref 11.5–15.5)
WBC: 3.7 K/uL — ABNORMAL LOW (ref 4.0–10.5)
nRBC: 0 % (ref 0.0–0.2)

## 2024-01-10 MED ORDER — TRAMADOL HCL 50 MG PO TABS
25.0000 mg | ORAL_TABLET | Freq: Two times a day (BID) | ORAL | Status: DC | PRN
  Administered 2024-01-10: 25 mg via ORAL
  Filled 2024-01-10 (×2): qty 1

## 2024-01-10 MED ORDER — POTASSIUM CHLORIDE CRYS ER 20 MEQ PO TBCR
40.0000 meq | EXTENDED_RELEASE_TABLET | Freq: Once | ORAL | Status: AC
  Administered 2024-01-10: 40 meq via ORAL
  Filled 2024-01-10: qty 2

## 2024-01-10 MED ORDER — METOLAZONE 2.5 MG PO TABS
2.5000 mg | ORAL_TABLET | ORAL | Status: DC
  Administered 2024-01-12 – 2024-01-14 (×2): 2.5 mg via ORAL
  Filled 2024-01-10 (×4): qty 1

## 2024-01-10 MED ORDER — MEDIHONEY WOUND/BURN DRESSING EX PSTE
1.0000 | PASTE | Freq: Every day | CUTANEOUS | Status: DC
  Administered 2024-01-14 – 2024-01-26 (×9): 1 via TOPICAL
  Filled 2024-01-10 (×3): qty 44

## 2024-01-10 MED ORDER — ACETAMINOPHEN 325 MG PO TABS
650.0000 mg | ORAL_TABLET | Freq: Four times a day (QID) | ORAL | Status: DC | PRN
  Administered 2024-01-10 – 2024-01-13 (×6): 650 mg via ORAL
  Filled 2024-01-10 (×6): qty 2

## 2024-01-10 MED ORDER — POTASSIUM CHLORIDE CRYS ER 20 MEQ PO TBCR
20.0000 meq | EXTENDED_RELEASE_TABLET | Freq: Two times a day (BID) | ORAL | Status: DC
  Administered 2024-01-11 – 2024-01-18 (×16): 20 meq via ORAL
  Filled 2024-01-10 (×16): qty 1

## 2024-01-10 MED ORDER — TRAMADOL HCL 50 MG PO TABS
50.0000 mg | ORAL_TABLET | Freq: Once | ORAL | Status: AC
  Administered 2024-01-10: 50 mg via ORAL
  Filled 2024-01-10: qty 1

## 2024-01-10 MED ORDER — TRAMADOL HCL 50 MG PO TABS
50.0000 mg | ORAL_TABLET | Freq: Two times a day (BID) | ORAL | Status: DC | PRN
  Administered 2024-01-10: 50 mg via ORAL
  Filled 2024-01-10: qty 1

## 2024-01-10 MED ORDER — ROSUVASTATIN CALCIUM 10 MG PO TABS
10.0000 mg | ORAL_TABLET | Freq: Every day | ORAL | Status: DC
  Administered 2024-01-10 – 2024-01-26 (×17): 10 mg via ORAL
  Filled 2024-01-10 (×17): qty 1

## 2024-01-10 MED ORDER — TORSEMIDE 20 MG PO TABS
100.0000 mg | ORAL_TABLET | Freq: Two times a day (BID) | ORAL | Status: DC
  Administered 2024-01-10 – 2024-01-15 (×12): 100 mg via ORAL
  Filled 2024-01-10 (×12): qty 5

## 2024-01-10 MED ORDER — PROCHLORPERAZINE EDISYLATE 10 MG/2ML IJ SOLN: 5.0000 mg | Freq: Four times a day (QID) | INTRAMUSCULAR | Status: DC | PRN

## 2024-01-10 MED ORDER — FENTANYL CITRATE PF 50 MCG/ML IJ SOSY
12.5000 ug | PREFILLED_SYRINGE | Freq: Once | INTRAMUSCULAR | Status: DC
  Filled 2024-01-10: qty 1

## 2024-01-10 NOTE — Progress Notes (Signed)
 Brief ID note Pt admitted for LE wounds and being eval for debridement No fever, wbc 6   Rec Cont cefepime  and linezolid  pending further culture results.

## 2024-01-10 NOTE — Progress Notes (Signed)
 PROGRESS NOTE    [name]  FMW:[id number] DOB: 19-Dec-1933 DOA: 01/09/2024 PCP: [name], MD  Chief Complaint  Patient presents with   Wound Infection    Hospital Course:  [name] is a 89 y.o. 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 Patient has 2 lower extremity ulcers, poor healing.  Has been on antibiotics, cultures growing MRSA and Corynebacterium, was sent to the hospital for IV antibiotics.    Subjective: Patient was examined at the bedside, new to me today.  Had a pleasant, has no new complaints today. Will discuss with ID regarding antibiotics and consulted orthopedic surgery for possible debridement   Objective: Vitals:   01/09/24 2145 01/10/24 0226 01/10/24 0334 01/10/24 0746  BP: (!) 154/53 98/88 (!) 129/55 (!) 133/58  Pulse: 60 60 61 61  Resp: 18 20 17 16   Temp: (!) 97.5 F (36.4 C)  98.8 F (37.1 C) 98.2 F (36.8 C)  TempSrc: Oral  Oral Oral  SpO2: 100%  100% 100%  Weight: 57 kg     Height: 5' 6 (1.676 m)       Intake/Output Summary (Last 24 hours) at 01/10/2024 0824 Last data filed at 01/10/2024 0456 Gross per 24 hour  Intake 148.66 ml  Output --  Net 148.66 ml   Filed Weights   01/09/24 2145  Weight: 57 kg    Examination: Constitutional: Chronically ill looking, NAD, calm, comfortable Neck: normal, supple, no masses, no thyromegaly Respiratory: clear to auscultation bilaterally,  Normal respiratory effort.  No accessory muscle use.  Cardiovascular: Regular rate and rhythm, no murmurs / rubs / gallops. No extremity edema. 2+ pedal pulses. No carotid bruits.  Abdomen: no tenderness, no masses palpated. No hepatosplenomegaly. Bowel sounds positive.  Musculoskeletal: Good range of motion, no joint swelling or tenderness, Skin: Bilateral lower extremity chronic stasis dermatitis with 2 open wounds which are deep about 5 x 6 cm in diameter with crusted base Neurologic: CN 2-12 grossly intact. Sensation intact, DTR normal. Strength 5/5 in all 4  Assessment & Plan:  Principal Problem:   Leg wound, left, initial encounter Active Problems:   Cellulitis of left lower extremity   Acute on chronic diastolic CHF (congestive heart failure) (HCC)   Normocytic anemia   Primary pancreatic neuroendocrine tumor   Hyperlipidemia   HOCM/IHSS   Atrial fibrillation, permanent (HCC)   COPD (chronic obstructive pulmonary disease) (HCC)   Acute on chronic kidney failure (HCC)   ICD (implantable cardioverter-defibrillator) in place   Hypertension   PAD (peripheral artery disease) (HCC)   History of nephrectomy secondary to RCC   Pulmonary hypertension (HCC)  Infected left lower extremity wound with surrounding cellulitis - Patient has outpatient culture apparently growing MRSA and corynebacterium - Lactic acid normal, procalcitonin not elevated, no leukocytosis on presentation. - XR left tibia/fibula large soft tissue defect within the distal left lower leg unchanged, persistent diffuse subcutaneous edema.  No acute/destructive bony abnormalities - US  negative for DVT bilaterally - Started on IV Zyvox , cefepime , will discuss with ID on-call - Follow-up blood cultures - US  ABI pending - Seen by WOC, recommend debridement.  Orthopedic surgery consulted for possible debridement, notified Dr. Maryrose - Pain management with Tylenol , tramadol   Hypokalemia -magnesium  normal -Monitor and replete as needed - Resume  home supplements  AKI on CKD 3b/IV - Cr 2.22, improved 1.78, s/p IV fluids, Baseline Cr 1.7-1.9 - Monitor Cr  Normocytic anemia - Monitor Hb   Chronic diastolic heart failure Hypertrophic cardiomyopathy Pulmonary HTN - Discontinue IV fluids - Continue home Torsemide  and Metolazone   COPD with chronic hypoxic respiratory failure On home oxygen  2L [place] Continue home inhalers   Hx pancreatic neuroendocrine cancer Patient is on chemotherapy.  Continue follow-up with oncologist outpatient   Hyperlipidemia: Continue with statin   CAD: Status post ICD placement.  Continue to monitor   Peripheral vascular disease: Will continue home regimen.   DVT prophylaxis: Lovenox  SQ   Code Status: Limited: Do not attempt resuscitation (DNR) -DNR-LIMITED -Do Not Intubate/DNI  Disposition:  TBD  Consultants:  Orthopedic surgery Infectious disease  Procedures:  None  Antimicrobials:  Anti-infectives (From admission, onward)    Start     Dose/Rate Route Frequency Ordered Stop   01/09/24 2200  linezolid  (ZYVOX ) IVPB 600 mg        600 mg 300 mL/hr over 60 Minutes Intravenous Every 12 hours 01/09/24 1759     01/09/24 1930  ceFEPIme  (MAXIPIME ) 2 g in sodium chloride  0.9 % 100 mL IVPB        2 g 200 mL/hr over 30 Minutes Intravenous Every 24 hours 01/09/24 1915         Data Reviewed: I have personally reviewed following labs and imaging studies CBC: Recent Labs  Lab 01/07/24 1355 01/09/24 1420 01/09/24 1947 01/10/24 0712  WBC 5.2 5.0 6.4 3.7*  NEUTROABS 3.9 3.8  --   --   HGB 9.9* 9.5* 9.2* 9.1*  HCT 32.6* 32.0* 30.8* 30.1*  MCV 78.2* 80.8 81.3 79.8*  PLT 217 208 196 186   Basic Metabolic Panel: Recent Labs  Lab 01/09/24 1420 01/09/24 1947 01/10/24 0712  NA 137  --  137  K 3.4*  --  3.4*  CL 94*  --  102  CO2 28  --  27  GLUCOSE 144*  --  145*  BUN 65*  --  60*  CREATININE 1.83* 2.22* 1.78*  CALCIUM  9.3  --  8.4*   GFR: Estimated Creatinine Clearance: 20 mL/min  (A) (by C-G formula based on SCr of 1.78 mg/dL (H)). Liver Function Tests: Recent Labs  Lab 01/09/24 1835 01/10/24 0712  AST 21 27  ALT 8 12  ALKPHOS 56 53  BILITOT 0.8 0.6  PROT 8.3* 6.5  ALBUMIN  4.0 3.1*   CBG: No results for input(s): GLUCAP in the last 168 hours.  Recent Results (from the past 240 hours)  Aerobic culture     Status: Abnormal   Collection Time: 01/01/24  6:28 PM   Specimen: Skin, Other  Result Value Ref Range Status   Aerobic Bacterial Culture Final report (A)  Final   Organism ID, Bacteria Comment (A)  Final    Comment: Methicillin - resistant Staphylococcus aureus Light growth Based on resistance to oxacillin this isolate would be resistant to all currently available beta-lactam antimicrobial agents, with the exception of the newer cephalosporins with anti-MRSA activity, such as Ceftaroline    Organism ID, Bacteria Comment (A)  Final    Comment: Corynebacterium striatum Heavy growth Susceptibility not normally performed on this organism.    Antimicrobial Susceptibility Comment  Final    Comment:       **  S = Susceptible; I = Intermediate; R = Resistant **                    P = Positive; N = Negative             MICS are expressed in micrograms per mL    Antibiotic                 RSLT#1    RSLT#2    RSLT#3    RSLT#4 Ciprofloxacin                   R Clindamycin                    R Erythromycin                   R Gentamicin                      S Levofloxacin                    R Linezolid                       S Oxacillin                      R Penicillin                     R Rifampin                       S Tetracycline                   R Trimethoprim /Sulfa             R Vancomycin                      S   Blood culture (routine x 2)     Status: None (Preliminary result)   Collection Time: 01/09/24  6:34 PM   Specimen: BLOOD  Result Value Ref Range Status   Specimen Description BLOOD LEFT ANTECUBITAL  Final   Special Requests   Final     BOTTLES DRAWN AEROBIC AND ANAEROBIC Blood Culture results may not be optimal due to an inadequate volume of blood received in culture bottles   Culture   Final    NO GROWTH < 24 HOURS Performed at [facility], [address], [place], [place] [address]    Report Status PENDING  Incomplete  Blood culture (routine x 2)     Status: None (Preliminary result)   Collection Time: 01/09/24  9:56 PM   Specimen: BLOOD  Result Value Ref Range Status   Specimen Description BLOOD BLOOD RIGHT ARM  Final   Special Requests   Final    BOTTLES DRAWN AEROBIC AND ANAEROBIC Blood Culture results may not be optimal due to an inadequate volume of blood received in culture bottles   Culture   Final    NO GROWTH < 12 HOURS Performed at [facility], [address] [place], [place] [address]    Report Status PENDING  Incomplete     Radiology Studies: US  Venous Img Lower Bilateral Result Date: 01/09/2024 EXAM: ULTRASOUND DUPLEX OF THE BILATERAL LOWER EXTREMITY VEINS TECHNIQUE: Duplex ultrasound using B-mode/gray scaled imaging and Doppler spectral analysis and color flow was obtained of the deep venous structures  of the bilateral lower extremity. COMPARISON: None. CLINICAL HISTORY: Cancer patient, worsening redness of bilateral lower extremities, swelling. FINDINGS: The visualized veins of the lower extremity are patent and free of echogenic thrombus. Limited views of the veins in the area of lower extremity wounds. The veins demonstrate good compressibility with normal color flow study and spectral analysis. IMPRESSION: 1. No evidence of DVT. Electronically signed by: Norman Gatlin MD 01/09/2024 06:58 PM EDT RP Workstation: [id number]   DG Tibia/Fibula Left Result Date: 01/09/2024 CLINICAL DATA:  Open wound, erythema and swelling EXAM: LEFT TIBIA AND FIBULA - 2 VIEW COMPARISON:  01/01/2024 FINDINGS: Frontal and lateral views of the left tibia and fibula are obtained. The bones are diffusely  osteopenic. There are no acute or destructive bony abnormalities. Specifically, no evidence of bony destruction or periosteal reaction to suggest osteomyelitis. Diffuse subcutaneous edema is again identified, with a large soft tissue defect within the anterior aspect of the distal left lower leg unchanged. No evidence of subcutaneous gas extending from the soft tissue defect. There are no radiopaque foreign bodies. IMPRESSION: 1. Large soft tissue defect within the distal left lower leg unchanged, with persistent diffuse subcutaneous edema. 2. No acute or destructive bony abnormalities. Electronically Signed   By: Ozell Daring M.D.   On: 01/09/2024 18:18    Scheduled Meds:  enoxaparin  (LOVENOX ) injection  30 mg Subcutaneous Q24H   feeding supplement  237 mL Oral BID BM   Continuous Infusions:  ceFEPime  (MAXIPIME ) IV Stopped (01/09/24 2039)   dextrose  5 % and 0.9 % NaCl 100 mL/hr at 01/09/24 2200   linezolid  (ZYVOX ) IV 600 mg (01/09/24 2229)     LOS: 1 day  MDM: Patient is high risk for one or more organ failure.  They necessitate ongoing hospitalization for continued IV therapies and subsequent lab monitoring. Total time spent interpreting labs and vitals, reviewing the medical record, coordinating care amongst consultants and care team members, directly assessing and discussing care with the patient and/or family: 55 min  Laree Lock, MD [facility]  To contact the attending physician between 7A-7P please use Epic Chat. To contact the covering physician during after hours 7P-7A, please review Amion.  01/10/2024, 8:24 AM   *This document has been created with the assistance of dictation software. Please excuse typographical errors. *

## 2024-01-10 NOTE — Consult Note (Signed)
 ORTHOPEDIC CONSULTATION  [name] [id number]  01/10/2024  CC:  Chief Complaint  Patient presents with   Wound Infection    History of Present IlIness: The patient is a 89 y.o. female with known history of chronic venous stasis ulcers followed by dermatology and referred to the hospital for IV antibiotics.  Patient reportedly has been in chronic wound care but the patient currently refuses wound care in the hospital.  It also appears the patient may have been seen by vascular surgery in the past.  Orthopedics was consulted for chronic lower extremity vascular ulcers.  Admission history for completeness: [name] is a 89 y.o. female with medical history significant of peripheral vascular disease, heart failure with preserved ejection fraction, chronic kidney failure stage III, history of nephrectomy, pulmonary hypertension, chronic stasis dermatitis, essential hypertension, COPD, atrial fibrillation, squamous cell carcinoma of the skin, hypertrophic cardiomyopathy, hyperlipidemia, normocytic anemia, chronic cellulitis of lower extremity and left lower extremity wound who goes to wound care center and also followed by dermatologist.  Patient has 2 lower extremity ulcers that are enlarging and not healing well.  Patient has been followed by dermatologist and has been on antibiotics and.  Cultures were obtained that grew MRSA and corynebacterium.  Patient was sent over to the hospital for IV antibiotics.  Patient also will continue with wound care.  She is on chronic home O2.  Patient also has significant allergies to narcotics.  Complaining of 8 out of 10 pain.    PMH:  Past Medical History:  Diagnosis Date   (HFpEF) heart failure with preserved ejection fraction (HCC)    EF=60-60%   Actinic keratosis 01/17/2015   R forearm   Adjustment disorder with anxiety    Adverse effect of other narcotics, sequela    Intolerance to all narcotics   Anti-Duffy antibodies present    Aortic  valve stenosis    Arthritis    some in my hands (11/11/2012)   Atrial fibrillation, permanent (HCC)    Eliquis    Atypical mole 03/25/2018   L forearm - severe   Automatic implantable cardioverter-defibrillator in situ    Avascular necrosis of hip (HCC) 05/03/2011   Carotid artery stenosis 09/2007   a. 09/2007: 60-79% bilateral (stable); b. 10/2008: 40-59% R 60-79%    Cellulitis of left lower extremity 07/13/2020   CKD (chronic kidney disease), stage III (HCC)    COPD (chronic obstructive pulmonary disease) (HCC)    Coronary artery disease    non-obstructive by 2006 cath   COVID-19 virus infection 02/12/2022   Displaced fracture of left femoral neck (HCC) 10/09/2018   GI bleed 03/28/2020   AVM   High cholesterol    Hypertension 05/20/2011   Hypertr obst cardiomyop    Hypotension, unspecified    cardiac cath 2006..nonobstructive CAD 30-40s lesions.SABRAETT 1/09 nondiagnostic due to poor HR response..Right Renal Cancer 2003   Iron  deficiency anemia    Long term (current) use of anticoagulants    On home oxygen  therapy    3L [place]   Osteoarthritis of right hip    PAD (peripheral artery disease) (HCC)    Pancreatic cancer (HCC)    sandostatin    PONV (postoperative nausea and vomiting)    Presence of permanent cardiac pacemaker    Pulmonary HTN (HCC)    RECTAL BLEEDING 10/13/2009   Qualifier: Diagnosis of  By: Kerman NP, Paula     Red blood cell antibody positive with compatible PRBC difficult to obtain    Anti FYA (Duffy  a) antibody. Must be transfused with PRB which are Duffy Antigen Negative and Crossmatch Compatible   Renal cell carcinoma (HCC) 2005   s/p right nephrectomy   Squamous cell carcinoma of skin 01/17/2015   R lat wrist   Squamous cell carcinoma of skin 01/29/2018   R post upper leg - superficially invasive   Squamous cell carcinoma of skin 03/25/2018   L lat foot   Squamous cell carcinoma of skin 11/09/2020   left lat foot - EDC 01/01/21, recurrent 01/31/21 - MOHs  02/22/21   Squamous cell carcinoma of skin 12/19/2021   Right Posterior Medial Thigh, EDC   Squamous cell carcinoma of skin 12/19/2021   SCC IS, L lat heel, EDC 02/04/2022   Squamous cell carcinoma of skin 01/21/2022   L forearm, EDC 02/04/2022   Squamous cell carcinoma of skin 01/21/2022   R lower leg below knee, EDC 02/04/2022   Squamous cell carcinoma of skin 01/21/2022   SCCIS, R post heel, EDC 02/04/2022   Squamous cell carcinoma of skin 07/01/2022   left lower abdomen, in situ, EDC   Squamous cell carcinoma of skin 07/01/2022   left medial chest, EDC   Urge incontinence    Venous stasis of both lower extremities     SH:  Past Surgical History:  Procedure Laterality Date   ABDOMINAL AORTOGRAM W/LOWER EXTREMITY N/A 02/12/2021   Procedure: ABDOMINAL AORTOGRAM W/LOWER EXTREMITY;  Surgeon: Sheree Penne Bruckner, MD;  Location: [facility] INVASIVE CV LAB;  Service: Cardiovascular;  Laterality: N/A;   ABDOMINAL HYSTERECTOMY  1975   for benign causes   APPENDECTOMY     BI-VENTRICULAR PACEMAKER UPGRADE  05/04/2010   BIOPSY  02/28/2019   Procedure: BIOPSY;  Surgeon: Teressa Toribio SQUIBB, MD;  Location: [facility] ENDOSCOPY;  Service: Endoscopy;;   BIOPSY  03/04/2022   Procedure: BIOPSY;  Surgeon: Wilhelmenia Aloha Raddle., MD;  Location: [facility];  Service: Gastroenterology;;   CARDIAC CATHETERIZATION  2006   CARDIOVERSION N/A 02/20/2018   Procedure: CARDIOVERSION;  Surgeon: [name], MD;  Location: [facility] ORS;  Service: Cardiovascular;  Laterality: N/A;   CARDIOVERSION N/A 03/27/2018   Procedure: CARDIOVERSION;  Surgeon: [name], MD;  Location: [facility] ORS;  Service: Cardiovascular;  Laterality: N/A;   CATARACT EXTRACTION W/ INTRAOCULAR LENS  IMPLANT, BILATERAL  01/2006-02-2006   CHOLECYSTECTOMY N/A 11/11/2012   Procedure: LAPAROSCOPIC CHOLECYSTECTOMY WITH INTRAOPERATIVE CHOLANGIOGRAM;  Surgeon: Donnice POUR. Belinda, MD;  Location: [facility] OR;  Service: General;  Laterality: N/A;    COLONOSCOPY WITH PROPOFOL  N/A 02/28/2019   Procedure: COLONOSCOPY WITH PROPOFOL ;  Surgeon: Teressa Toribio SQUIBB, MD;  Location: [facility] ENDOSCOPY;  Service: Endoscopy;  Laterality: N/A;   ENTEROSCOPY N/A 03/30/2020   Procedure: ENTEROSCOPY;  Surgeon: San Sandor GAILS, DO;  Location: [facility] ENDOSCOPY;  Service: Gastroenterology;  Laterality: N/A;   ENTEROSCOPY N/A 02/10/2021   Procedure: ENTEROSCOPY;  Surgeon: Rollin Dover, MD;  Location: [facility] ENDOSCOPY;  Service: Endoscopy;  Laterality: N/A;   ENTEROSCOPY N/A 02/15/2022   Procedure: ENTEROSCOPY;  Surgeon: Federico Rosario BROCKS, MD;  Location: [facility] ENDOSCOPY;  Service: Gastroenterology;  Laterality: N/A;   ENTEROSCOPY N/A 11/06/2022   Procedure: ENTEROSCOPY;  Surgeon: Wilhelmenia Aloha Raddle., MD;  Location: [facility] ENDOSCOPY;  Service: Gastroenterology;  Laterality: N/A;   EP IMPLANTABLE DEVICE N/A 02/21/2016   Procedure: ICD Generator Changeout;  Surgeon: Elspeth BROCKS Sage, MD;  Location: [facility] INVASIVE CV LAB;  Service: Cardiovascular;  Laterality: N/A;   ESOPHAGOGASTRODUODENOSCOPY N/A 03/04/2022   Procedure: ESOPHAGOGASTRODUODENOSCOPY (EGD);  Surgeon: Wilhelmenia Aloha Raddle., MD;  Location: [facility] ENDOSCOPY;  Service: Gastroenterology;  Laterality: N/A;   ESOPHAGOGASTRODUODENOSCOPY (EGD) WITH PROPOFOL  N/A 02/28/2019   Procedure: ESOPHAGOGASTRODUODENOSCOPY (EGD) WITH PROPOFOL ;  Surgeon: Teressa Toribio SQUIBB, MD;  Location: [facility] ENDOSCOPY;  Service: Endoscopy;  Laterality: N/A;   EUS N/A 03/04/2022   Procedure: UPPER ENDOSCOPIC ULTRASOUND (EUS) RADIAL;  Surgeon: Wilhelmenia Aloha Raddle., MD;  Location: [facility] ENDOSCOPY;  Service: Gastroenterology;  Laterality: N/A;   FINE NEEDLE ASPIRATION N/A 03/04/2022   Procedure: FINE NEEDLE ASPIRATION (FNA) LINEAR;  Surgeon: Wilhelmenia Aloha Raddle., MD;  Location: [facility] ENDOSCOPY;  Service: Gastroenterology;  Laterality: N/A;   GIVENS CAPSULE STUDY N/A 03/15/2019   Procedure: GIVENS CAPSULE STUDY;  Surgeon: Eda Iha, MD;  Location: [facility] ENDOSCOPY;   Service: Gastroenterology;  Laterality: N/A;   HOT HEMOSTASIS N/A 03/30/2020   Procedure: HOT HEMOSTASIS (ARGON PLASMA COAGULATION/BICAP);  Surgeon: San Sandor GAILS, DO;  Location: [facility] ENDOSCOPY;  Service: Gastroenterology;  Laterality: N/A;   HOT HEMOSTASIS N/A 02/10/2021   Procedure: HOT HEMOSTASIS (ARGON PLASMA COAGULATION/BICAP);  Surgeon: Rollin Dover, MD;  Location: [facility] ENDOSCOPY;  Service: Endoscopy;  Laterality: N/A;   HOT HEMOSTASIS N/A 02/15/2022   Procedure: HOT HEMOSTASIS (ARGON PLASMA COAGULATION/BICAP);  Surgeon: Federico Rosario BROCKS, MD;  Location: [facility] ENDOSCOPY;  Service: Gastroenterology;  Laterality: N/A;   HOT HEMOSTASIS N/A 11/06/2022   Procedure: HOT HEMOSTASIS (ARGON PLASMA COAGULATION/BICAP);  Surgeon: Wilhelmenia Aloha Raddle., MD;  Location: [facility] ENDOSCOPY;  Service: Gastroenterology;  Laterality: N/A;   INSERT / REPLACE / REMOVE PACEMAKER  05-01-11   02-28-05-/05-04-10-ICD-MEDTRONIC MAXIMAL DR   IR ANGIOGRAM SELECTIVE EACH ADDITIONAL VESSEL  02/18/2022   IR ANGIOGRAM SELECTIVE EACH ADDITIONAL VESSEL  02/18/2022   IR ANGIOGRAM VISCERAL SELECTIVE  02/16/2022   IR ANGIOGRAM VISCERAL SELECTIVE  02/16/2022   IR EMBO ART  VEN HEMORR LYMPH EXTRAV  INC GUIDE ROADMAPPING  02/16/2022   IR THORACENTESIS ASP PLEURAL SPACE W/IMG GUIDE  02/21/2022   IR THORACENTESIS ASP PLEURAL SPACE W/IMG GUIDE  02/22/2022   IR US  GUIDE VASC ACCESS RIGHT  02/16/2022   JOINT REPLACEMENT     LAPAROSCOPIC CHOLECYSTECTOMY  11/11/2012   LAPAROSCOPIC LYSIS OF ADHESIONS N/A 11/11/2012   Procedure: LAPAROSCOPIC LYSIS OF ADHESIONS;  Surgeon: Donnice POUR. Belinda, MD;  Location: [facility] OR;  Service: General;  Laterality: N/A;   NEPHRECTOMY Right 06/2001    S/P RENAL CELL CANCER   PERIPHERAL VASCULAR INTERVENTION Bilateral 02/12/2021   Procedure: PERIPHERAL VASCULAR INTERVENTION;  Surgeon: Sheree Penne Bruckner, MD;  Location: [facility] INVASIVE CV LAB;  Service: Cardiovascular;  Laterality: Bilateral;  Iliac artery stents    PRESSURE SENSOR/CARDIOMEMS N/A 02/03/2019   Procedure: PRESSURE SENSOR/CARDIOMEMS;  Surgeon: [name], MD;  Location: [facility] INVASIVE CV LAB;  Service: Cardiovascular;  Laterality: N/A;   RIGHT HEART CATH N/A 11/09/2018   Procedure: RIGHT HEART CATH;  Surgeon: [name], MD;  Location: [facility] INVASIVE CV LAB;  Service: Cardiovascular;  Laterality: N/A;   RIGHT HEART CATH N/A 03/08/2019   Procedure: RIGHT HEART CATH;  Surgeon: [name], MD;  Location: [facility] INVASIVE CV LAB;  Service: Cardiovascular;  Laterality: N/A;   SUBMUCOSAL TATTOO INJECTION  02/28/2019   Procedure: SUBMUCOSAL TATTOO INJECTION;  Surgeon: Teressa Toribio SQUIBB, MD;  Location: [facility] ENDOSCOPY;  Service: Endoscopy;;   SUBMUCOSAL TATTOO INJECTION  11/06/2022   Procedure: SUBMUCOSAL TATTOO INJECTION;  Surgeon: Wilhelmenia Aloha Raddle., MD;  Location: [facility] ENDOSCOPY;  Service: Gastroenterology;;   TOTAL HIP ARTHROPLASTY Right 05/03/2011   Procedure: TOTAL HIP ARTHROPLASTY ANTERIOR APPROACH;  Surgeon: Bruckner GRADE  Vernetta;  Location: [facility] ORS;  Service: Orthopedics;  Laterality: Right;  Removal of Cannulated Screws Right Hip, Right Direct Anterior Hip Replacement   TOTAL HIP ARTHROPLASTY Left 10/09/2018   Procedure: TOTAL HIP ARTHROPLASTY ANTERIOR APPROACH;  Surgeon: Fidel Rogue, MD;  Location: [facility] OR;  Service: Orthopedics;  Laterality: Left;    ALL:  Allergies  Allergen Reactions   Codeine Nausea And Vomiting and Other (See Comments)    Hallucinations, too- CANNOT HAVE; thinks she may have received Narcan  to reverse the effect   Dilaudid  [Hydromorphone ] Other (See Comments)    Excessive Somnolence- Required Narcan  X2   Tape Other (See Comments)    CAN TOLERATE ONLY EASY-RELEASE, PAPER TAPE, AS THE SKIN IS VERY THIN AND WILL BRUISE AND TEAR VERY EASILY   Amoxil  [Amoxicillin ] Nausea Only   Asa [Aspirin ] Other (See Comments)    Told to avoid due to reduced kidney function, has 1 remaining kidney   Ms Contin  [Morphine ] Nausea And  Vomiting and Other (See Comments)    Hallucinations, also   Neurontin  [Gabapentin ] Other (See Comments)    Out of it   Nsaids Other (See Comments)    Told to avoid due to reduced kidney function, has 1 remaining kidney    MED:  Medications Prior to Admission  Medication Sig Dispense Refill Last Dose/Taking   acetaminophen  (TYLENOL ) 500 MG tablet Take 1,000 mg by mouth every 8 (eight) hours.   01/08/2024   Ascorbic Acid  (VITAMIN C  PO) Take 1 tablet by mouth daily.   01/08/2024   Cholecalciferol  (VITAMIN D -3 PO) Take 2 capsules by mouth daily.   01/08/2024   CRANBERRY PO Take 1 tablet by mouth daily.   01/08/2024   Cyanocobalamin  (B-12 PO) Take 1 tablet by mouth daily.   01/08/2024   ferrous gluconate  (FERGON) 324 MG tablet Take 1 tablet (324 mg total) by mouth daily with breakfast. 30 tablet 3 01/08/2024   fluticasone  (FLONASE ) 50 MCG/ACT nasal spray Place 2 sprays into both nostrils daily as needed for allergies or rhinitis. 16 g 0 Unknown   metolazone  (ZAROXOLYN ) 2.5 MG tablet Take 1 tablet (2.5 mg total) by mouth 3 (three) times a week. Every Tues., Thurs and Sat. with 40 meq of potassium 90 tablet 3 01/08/2024   Multiple Vitamins-Minerals (HAIR SKIN NAILS PO) Take 1 capsule by mouth daily.   01/08/2024   Potassium Chloride  ER 20 MEQ TBCR Take 1 tablet (20 mEq total) by mouth daily. (Patient taking differently: Take 20 mEq by mouth 2 (two) times daily.) 30 tablet 2 01/08/2024   torsemide  (DEMADEX ) 20 MG tablet Take 5 tablets (100 mg total) by mouth 2 (two) times daily. 250 tablet 3 01/09/2024   traMADol  (ULTRAM ) 50 MG tablet Take 1 tablet (50 mg total) by mouth every 12 (twelve) hours as needed. 30 tablet 0 Unknown   [EXPIRED] cefdinir  (OMNICEF ) 250 MG/5ML suspension Take 6 mLs (300 mg total) by mouth daily for 7 days. (Patient not taking: Reported on 01/09/2024) 42 mL 0 Not Taking   clotrimazole -betamethasone  (LOTRISONE ) cream Apply topically 2 (two) times daily. (Patient not taking: Reported on 01/09/2024) 30  g 0 Not Taking   gabapentin  (NEURONTIN ) 100 MG capsule Take 1 capsule (100 mg total) by mouth at bedtime. (Patient not taking: Reported on 01/09/2024) 30 capsule 1 Not Taking   pantoprazole  (PROTONIX ) 40 MG tablet Take 1 tablet (40 mg total) by mouth daily. (Patient not taking: Reported on 01/09/2024) 30 tablet 3 Not Taking   rosuvastatin  (CRESTOR ) 10 MG  tablet Take 1 tablet (10 mg total) by mouth daily. (Patient not taking: Reported on 12/01/2023) 30 tablet 0 Not Taking   SANTYL 250 UNIT/GM ointment Apply topically daily. (Patient not taking: Reported on 01/09/2024)   Not Taking    All home medications have been reviewed as documented in the medication reconciliation portion of the patient record.  FH:  Family History  Problem Relation Age of Onset   Heart failure Mother    Early death Father        car accident   Breast cancer Maternal Aunt 15   Breast cancer Cousin    Breast cancer Other    Colon cancer Neg Hx    Esophageal cancer Neg Hx    Liver disease Neg Hx    Pancreatic cancer Neg Hx    Rectal cancer Neg Hx     Social:  reports that she quit smoking about 22 years ago. Her smoking use included cigarettes. She started smoking about 62 years ago. She has a 20 pack-year smoking history. She has never used smokeless tobacco. She reports that she does not drink alcohol and does not use drugs.  Review of Systems: General: Denies fever, chills, weight loss Eyes: Denies blurry vision, changes in vision ENT: Denies sore throat, congestions, nosebleeds CV: Denies chest pain, palpitations Respiratory: Denies shortness of breath, wheezing, cough Gl: Denies abdominal pain, nausea, vomiting GU: Denies hematuria Integumentary: Denies rashes or lesions Neuro: Denies headache, dizziness Psych: Negative Hem/Onc: Denies easy bruising or bleeding disorders Musculoskeletal: See HPI above.  Vitals: BP 101/66   Pulse 61   Temp (!) 97.5 F (36.4 C) (Oral)   Resp 17   Ht 5' 6 (1.676 m)   Wt  57 kg   SpO2 96%   BMI 20.29 kg/m    Physical Exam: General: Awake, alert and oriented, no acute distress. Eyes: Pupils reactive, EOMI, normal conjunctiva, no scleral icterus. HENT: Normocephalic, atraumatic, normal hearing, moist oral mucosa Neck: Supple, non-tender, no cervical lymphadenopathy. Lungs: Chest rise is symmetric, non-labored respiration, chest wall nontender to palpation Heart: Normal rate by palpation, normal peripheral perfusion Abdomen: Soft, non-tender, non-distended. Pelvis is stable. Skin: Skin envelope intact, dry and pink, no rashes or lesions, no signs of infection. Neurologic: Awake, alert, and oriented X3 Psychiatric: Cooperative, appropriate mood and affect.  Musculoskeletal: Evaluation of patient's lower extremities reveals chronic skin changes in keeping with chronic vascular disease.  The patient has multiple deep ulcers covered with the necrotic, dry eschar.  The patient refuses any examination of the lower extremities other than visual inspection because of complaints of pain.  She has also refused wound care including routine vascular dressing changes.  Radiographic findings: Radiographs of the patient's left tibia/fibula obtained at this admission reveal soft tissue changes in keeping with the obvious venous stasis ulcers and skin necrosis.  No bony changes indicating osteomyelitis are noted.   Labs:  Recent Labs    01/09/24 1420 01/09/24 1947 01/10/24 0712  HGB 9.5* 9.2* 9.1*   Recent Labs    01/09/24 1947 01/10/24 0712  WBC 6.4 3.7*  RBC 3.79* 3.77*  HCT 30.8* 30.1*  PLT 196 186   Recent Labs    01/09/24 1420 01/09/24 1947 01/10/24 0712  NA 137  --  137  K 3.4*  --  3.4*  CL 94*  --  102  CO2 28  --  27  BUN 65*  --  60*  CREATININE 1.83* 2.22* 1.78*  GLUCOSE 144*  --  145*  CALCIUM  9.3  --  8.4*   No results for input(s): LABPT, INR in the last 72 hours.   Assessment/Plan:    Assessment: 89 year old female with  chronic venous stasis ulcers.  Currently refusing wound care and/or dressing changes by the nursing staff.   Plan: I discussed with the patient that at this stage there is no signs of osteomyelitis in need of orthopedic emergent surgery.  I discussed with the patient and her nursing care team that I would recommend a vascular consult.  The patient has vascular studies scheduled and the results are pending.  Treatment of this condition is out of the scope of our orthopedic clinic.  With no sign of osteomyelitis or joint involvement we will sign off.  If not formally already performed, I would recommend the primary admitting physician obtain a vascular consult for their treatment recommendations and long-term wound care.  Cordella Lamar Knack M.D. 01/10/2024 6:06 PM

## 2024-01-10 NOTE — Consult Note (Signed)
 WOC Nurse Consult Note: patient with B lower extremity wounds, seen by dermatology 8/28 who felt these were likely venous ulcers however discussed biopsy with patient to rule out other pathology which patient deferred; also recommended patient go to wound care center for possible care and debridement  Reason for Consult: BLE wounds  Wound type: full thickness L dorsal foot, R heel,  L lower leg all with necrotic tissue  Pressure Injury POA:NA not pressure  Measurement: see nursing flowsheet  Wound bed: dark necrotic tissue  Drainage (amount, consistency, odor) see nursing flowsheet  Periwound: hyperkeratotic skin  Dressing procedure/placement/frequency: Cleanse B lower extremity/foot wounds with Vashe wound cleanser Soila [phone]) do not rinse and allow to air dry. Apply Medihoney to wound beds daily, cover with dry gauze and silicone foam or Kerlix roll guaze whichever patient will allow.   Per bedside nurse patient is refusing any care to wounds at this time.  Primary MD has consulted orthopedics. Any wound care orders placed by orthopedist supercede wound care orders placed by [facility] RN.    Patient appears to have been referred to wound care center for ongoing management as outpatient.   POC discussed with bedside nurse and primary MD. WOC team will not follow. Re-consult if further needs arise.   Thank you,    Powell Bar MSN, RN-BC, [employer]

## 2024-01-10 NOTE — Plan of Care (Signed)
  Problem: Clinical Measurements: Goal: Ability to maintain clinical measurements within normal limits will improve Outcome: Progressing   Problem: Nutrition: Goal: Adequate nutrition will be maintained Outcome: Progressing   Problem: Coping: Goal: Level of anxiety will decrease Outcome: Progressing   

## 2024-01-10 NOTE — Progress Notes (Signed)
 Nurse went into do the patient's dressing as ordered by wound care.  Patient refusing all wound care including cleansing the wound.  Patient was educated on the purpose of the wound care. Patient still refusing wound care.

## 2024-01-10 NOTE — Plan of Care (Signed)
  Problem: Education: Goal: Knowledge of General Education information will improve Description: Including pain rating scale, medication(s)/side effects and non-pharmacologic comfort measures Outcome: Progressing   Problem: Clinical Measurements: Goal: Ability to maintain clinical measurements within normal limits will improve Outcome: Progressing Goal: Will remain free from infection Outcome: Progressing Goal: Diagnostic test results will improve Outcome: Progressing Goal: Respiratory complications will improve Outcome: Progressing Goal: Cardiovascular complication will be avoided Outcome: Progressing   Problem: Coping: Goal: Level of anxiety will decrease Outcome: Progressing   Problem: Elimination: Goal: Will not experience complications related to bowel motility Outcome: Progressing Goal: Will not experience complications related to urinary retention Outcome: Progressing   Problem: Pain Managment: Goal: General experience of comfort will improve and/or be controlled Outcome: Progressing   Problem: Safety: Goal: Ability to remain free from injury will improve Outcome: Progressing

## 2024-01-11 DIAGNOSIS — S81802A Unspecified open wound, left lower leg, initial encounter: Secondary | ICD-10-CM | POA: Diagnosis not present

## 2024-01-11 LAB — CBC
HCT: 31 % — ABNORMAL LOW (ref 36.0–46.0)
Hemoglobin: 9.1 g/dL — ABNORMAL LOW (ref 12.0–15.0)
MCH: 24 pg — ABNORMAL LOW (ref 26.0–34.0)
MCHC: 29.4 g/dL — ABNORMAL LOW (ref 30.0–36.0)
MCV: 81.8 fL (ref 80.0–100.0)
Platelets: 176 K/uL (ref 150–400)
RBC: 3.79 MIL/uL — ABNORMAL LOW (ref 3.87–5.11)
RDW: 20.4 % — ABNORMAL HIGH (ref 11.5–15.5)
WBC: 4.2 K/uL (ref 4.0–10.5)
nRBC: 0 % (ref 0.0–0.2)

## 2024-01-11 LAB — BASIC METABOLIC PANEL WITH GFR
Anion gap: 9 (ref 5–15)
BUN: 53 mg/dL — ABNORMAL HIGH (ref 8–23)
CO2: 27 mmol/L (ref 22–32)
Calcium: 8.4 mg/dL — ABNORMAL LOW (ref 8.9–10.3)
Chloride: 98 mmol/L (ref 98–111)
Creatinine, Ser: 1.66 mg/dL — ABNORMAL HIGH (ref 0.44–1.00)
GFR, Estimated: 30 mL/min — ABNORMAL LOW (ref >60)
Glucose, Bld: 95 mg/dL (ref 70–99)
Potassium: 3.7 mmol/L (ref 3.5–5.1)
Sodium: 134 mmol/L — ABNORMAL LOW (ref 135–145)

## 2024-01-11 MED ORDER — TRAMADOL HCL 50 MG PO TABS
50.0000 mg | ORAL_TABLET | Freq: Two times a day (BID) | ORAL | Status: DC | PRN
  Administered 2024-01-11 – 2024-01-13 (×4): 50 mg via ORAL
  Filled 2024-01-11 (×4): qty 1

## 2024-01-11 NOTE — Consult Note (Signed)
 [facility] Vascular Consult Note  MRN : [id number]  [name] is a 89 y.o. (August 22, 1933) female who presents with chief complaint of  Chief Complaint  Patient presents with   Wound Infection  .  History of Present Illness: For the past 2 years she has had bilateral lower leg hyperkeratosis.  No instigating factors and normal legs prior.  Has history of right heart failure.  Not much edema and no use of support garment.  Progressively worse especially on left with severe pain.  No fever or chills.  Admitted with diagnosis of cellulitis.  Has had some Wound Care in past but not currently.  Seen this admission by Wound Care and Ortho.  No pelvic surgery.  Bilateral hip fx repairs.  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  On Avastin  for a year but legs started a year prior.  Says a Dermatologist Bx the left foot wound (initial site) and said it was cancer but he got it all.  Current Facility-Administered Medications  Medication Dose Route Frequency Provider Last Rate Last Admin   acetaminophen  (TYLENOL ) tablet 650 mg  650 mg Oral Q6H PRN Ponnala, Shruthi, MD   650 mg at 01/11/24 0423   ceFEPIme  (MAXIPIME ) 2 g in sodium chloride  0.9 % 100 mL IVPB  2 g Intravenous Q24H Elesa Perkins, RPH 200 mL/hr at 01/10/24 1843 2 g at 01/10/24 1843   enoxaparin  (LOVENOX ) injection 30 mg  30 mg Subcutaneous Q24H Garba, Mohammad L, MD   30 mg at 01/10/24 1839   feeding supplement (ENSURE PLUS HIGH PROTEIN) liquid 237 mL  237 mL Oral BID BM Garba, Mohammad L, MD   237 mL at 01/11/24 0914   leptospermum manuka honey (MEDIHONEY) paste 1 Application  1 Application Topical Daily Ponnala, Shruthi, MD       linezolid  (ZYVOX ) IVPB 600 mg  600 mg Intravenous Q12H Nicholaus Maize E, MD 300 mL/hr at 01/11/24 0915 600 mg at 01/11/24 0915   [START ON 01/12/2024] metolazone  (ZAROXOLYN ) tablet 2.5 mg  2.5 mg Oral Once per day on Monday Wednesday Friday Jerelene Critchley, MD       ondansetron  (ZOFRAN ) tablet 4 mg  4 mg Oral Q6H PRN Sim Emery CROME, MD   4 mg at 01/10/24 1837   Or   ondansetron  (ZOFRAN ) injection 4 mg  4 mg Intravenous Q6H PRN Sim Emery CROME, MD       potassium chloride  SA (KLOR-CON  M) CR tablet 20 mEq  20 mEq Oral BID Ponnala, Shruthi, MD   20 mEq at 01/11/24 0915   prochlorperazine  (COMPAZINE ) injection 5 mg  5 mg Intravenous Q6H PRN Ponnala, Shruthi, MD       rosuvastatin  (CRESTOR ) tablet 10 mg  10 mg Oral Daily Ponnala, Shruthi, MD   10 mg at 01/11/24 0915   torsemide  (DEMADEX ) tablet 100 mg  100 mg Oral BID Ponnala, Shruthi, MD   100 mg at 01/11/24 0915   traMADol  (ULTRAM ) tablet 50 mg  50 mg Oral Q12H PRN Ponnala, Shruthi, MD   50 mg at 01/11/24 1129    Past Medical History:  Diagnosis Date   (HFpEF) heart failure with preserved ejection fraction (HCC)    EF=60-60%   Actinic keratosis 01/17/2015   R forearm   Adjustment disorder with anxiety    Adverse effect of other narcotics, sequela    Intolerance to all narcotics   Anti-Duffy antibodies present    Aortic valve stenosis  Arthritis    some in my hands (11/11/2012)   Atrial fibrillation, permanent (HCC)    Eliquis    Atypical mole 03/25/2018   L forearm - severe   Automatic implantable cardioverter-defibrillator in situ    Avascular necrosis of hip (HCC) 05/03/2011   Carotid artery stenosis 09/2007   a. 09/2007: 60-79% bilateral (stable); b. 10/2008: 40-59% R 60-79%    Cellulitis of left lower extremity 07/13/2020   CKD (chronic kidney disease), stage III (HCC)    COPD (chronic obstructive pulmonary disease) (HCC)    Coronary artery disease    non-obstructive by 2006 cath   COVID-19 virus infection 02/12/2022   Displaced fracture of left femoral neck (HCC) 10/09/2018   GI bleed 03/28/2020   AVM   High cholesterol    Hypertension 05/20/2011   Hypertr obst cardiomyop    Hypotension, unspecified    cardiac cath 2006..nonobstructive CAD 30-40s lesions.SABRAETT  1/09 nondiagnostic due to poor HR response..Right Renal Cancer 2003   Iron  deficiency anemia    Long term (current) use of anticoagulants    On home oxygen  therapy    3L [place]   Osteoarthritis of right hip    PAD (peripheral artery disease) (HCC)    Pancreatic cancer (HCC)    sandostatin    PONV (postoperative nausea and vomiting)    Presence of permanent cardiac pacemaker    Pulmonary HTN (HCC)    RECTAL BLEEDING 10/13/2009   Qualifier: Diagnosis of  By: Kerman NP, Paula     Red blood cell antibody positive with compatible PRBC difficult to obtain    Anti FYA (Duffy a) antibody. Must be transfused with PRB which are Duffy Antigen Negative and Crossmatch Compatible   Renal cell carcinoma (HCC) 2005   s/p right nephrectomy   Squamous cell carcinoma of skin 01/17/2015   R lat wrist   Squamous cell carcinoma of skin 01/29/2018   R post upper leg - superficially invasive   Squamous cell carcinoma of skin 03/25/2018   L lat foot   Squamous cell carcinoma of skin 11/09/2020   left lat foot - EDC 01/01/21, recurrent 01/31/21 - MOHs 02/22/21   Squamous cell carcinoma of skin 12/19/2021   Right Posterior Medial Thigh, EDC   Squamous cell carcinoma of skin 12/19/2021   SCC IS, L lat heel, EDC 02/04/2022   Squamous cell carcinoma of skin 01/21/2022   L forearm, EDC 02/04/2022   Squamous cell carcinoma of skin 01/21/2022   R lower leg below knee, EDC 02/04/2022   Squamous cell carcinoma of skin 01/21/2022   SCCIS, R post heel, EDC 02/04/2022   Squamous cell carcinoma of skin 07/01/2022   left lower abdomen, in situ, EDC   Squamous cell carcinoma of skin 07/01/2022   left medial chest, EDC   Urge incontinence    Venous stasis of both lower extremities     Past Surgical History:  Procedure Laterality Date   ABDOMINAL AORTOGRAM W/LOWER EXTREMITY N/A 02/12/2021   Procedure: ABDOMINAL AORTOGRAM W/LOWER EXTREMITY;  Surgeon: Sheree Penne Bruckner, MD;  Location: [facility] INVASIVE CV LAB;   Service: Cardiovascular;  Laterality: N/A;   ABDOMINAL HYSTERECTOMY  1975   for benign causes   APPENDECTOMY     BI-VENTRICULAR PACEMAKER UPGRADE  05/04/2010   BIOPSY  02/28/2019   Procedure: BIOPSY;  Surgeon: Teressa Toribio SQUIBB, MD;  Location: [facility] ENDOSCOPY;  Service: Endoscopy;;   BIOPSY  03/04/2022   Procedure: BIOPSY;  Surgeon: Wilhelmenia Aloha Raddle., MD;  Location: [facility] ENDOSCOPY;  Service: Gastroenterology;;  CARDIAC CATHETERIZATION  2006   CARDIOVERSION N/A 02/20/2018   Procedure: CARDIOVERSION;  Surgeon: [name], MD;  Location: [facility] ORS;  Service: Cardiovascular;  Laterality: N/A;   CARDIOVERSION N/A 03/27/2018   Procedure: CARDIOVERSION;  Surgeon: [name], MD;  Location: [facility] ORS;  Service: Cardiovascular;  Laterality: N/A;   CATARACT EXTRACTION W/ INTRAOCULAR LENS  IMPLANT, BILATERAL  01/2006-02-2006   CHOLECYSTECTOMY N/A 11/11/2012   Procedure: LAPAROSCOPIC CHOLECYSTECTOMY WITH INTRAOPERATIVE CHOLANGIOGRAM;  Surgeon: Donnice POUR. Belinda, MD;  Location: [facility] OR;  Service: General;  Laterality: N/A;   COLONOSCOPY WITH PROPOFOL  N/A 02/28/2019   Procedure: COLONOSCOPY WITH PROPOFOL ;  Surgeon: Teressa Toribio SQUIBB, MD;  Location: [facility] ENDOSCOPY;  Service: Endoscopy;  Laterality: N/A;   ENTEROSCOPY N/A 03/30/2020   Procedure: ENTEROSCOPY;  Surgeon: San Sandor GAILS, DO;  Location: [facility] ENDOSCOPY;  Service: Gastroenterology;  Laterality: N/A;   ENTEROSCOPY N/A 02/10/2021   Procedure: ENTEROSCOPY;  Surgeon: Rollin Dover, MD;  Location: [facility] ENDOSCOPY;  Service: Endoscopy;  Laterality: N/A;   ENTEROSCOPY N/A 02/15/2022   Procedure: ENTEROSCOPY;  Surgeon: Federico Rosario BROCKS, MD;  Location: [facility] ENDOSCOPY;  Service: Gastroenterology;  Laterality: N/A;   ENTEROSCOPY N/A 11/06/2022   Procedure: ENTEROSCOPY;  Surgeon: Wilhelmenia Aloha Raddle., MD;  Location: [facility] ENDOSCOPY;  Service: Gastroenterology;  Laterality: N/A;   EP IMPLANTABLE DEVICE N/A 02/21/2016   Procedure: ICD Generator Changeout;   Surgeon: Elspeth BROCKS Sage, MD;  Location: [facility] INVASIVE CV LAB;  Service: Cardiovascular;  Laterality: N/A;   ESOPHAGOGASTRODUODENOSCOPY N/A 03/04/2022   Procedure: ESOPHAGOGASTRODUODENOSCOPY (EGD);  Surgeon: Wilhelmenia Aloha Raddle., MD;  Location: [facility] ENDOSCOPY;  Service: Gastroenterology;  Laterality: N/A;   ESOPHAGOGASTRODUODENOSCOPY (EGD) WITH PROPOFOL  N/A 02/28/2019   Procedure: ESOPHAGOGASTRODUODENOSCOPY (EGD) WITH PROPOFOL ;  Surgeon: Teressa Toribio SQUIBB, MD;  Location: [facility] ENDOSCOPY;  Service: Endoscopy;  Laterality: N/A;   EUS N/A 03/04/2022   Procedure: UPPER ENDOSCOPIC ULTRASOUND (EUS) RADIAL;  Surgeon: Wilhelmenia Aloha Raddle., MD;  Location: [facility] ENDOSCOPY;  Service: Gastroenterology;  Laterality: N/A;   FINE NEEDLE ASPIRATION N/A 03/04/2022   Procedure: FINE NEEDLE ASPIRATION (FNA) LINEAR;  Surgeon: Wilhelmenia Aloha Raddle., MD;  Location: [facility] ENDOSCOPY;  Service: Gastroenterology;  Laterality: N/A;   GIVENS CAPSULE STUDY N/A 03/15/2019   Procedure: GIVENS CAPSULE STUDY;  Surgeon: Eda Iha, MD;  Location: [facility] ENDOSCOPY;  Service: Gastroenterology;  Laterality: N/A;   HOT HEMOSTASIS N/A 03/30/2020   Procedure: HOT HEMOSTASIS (ARGON PLASMA COAGULATION/BICAP);  Surgeon: San Sandor GAILS, DO;  Location: [facility] ENDOSCOPY;  Service: Gastroenterology;  Laterality: N/A;   HOT HEMOSTASIS N/A 02/10/2021   Procedure: HOT HEMOSTASIS (ARGON PLASMA COAGULATION/BICAP);  Surgeon: Rollin Dover, MD;  Location: [facility] ENDOSCOPY;  Service: Endoscopy;  Laterality: N/A;   HOT HEMOSTASIS N/A 02/15/2022   Procedure: HOT HEMOSTASIS (ARGON PLASMA COAGULATION/BICAP);  Surgeon: Federico Rosario BROCKS, MD;  Location: [facility] ENDOSCOPY;  Service: Gastroenterology;  Laterality: N/A;   HOT HEMOSTASIS N/A 11/06/2022   Procedure: HOT HEMOSTASIS (ARGON PLASMA COAGULATION/BICAP);  Surgeon: Wilhelmenia Aloha Raddle., MD;  Location: [facility] ENDOSCOPY;  Service: Gastroenterology;  Laterality: N/A;   INSERT / REPLACE / REMOVE PACEMAKER  05-01-11    02-28-05-/05-04-10-ICD-MEDTRONIC MAXIMAL DR   IR ANGIOGRAM SELECTIVE EACH ADDITIONAL VESSEL  02/18/2022   IR ANGIOGRAM SELECTIVE EACH ADDITIONAL VESSEL  02/18/2022   IR ANGIOGRAM VISCERAL SELECTIVE  02/16/2022   IR ANGIOGRAM VISCERAL SELECTIVE  02/16/2022   IR EMBO ART  VEN HEMORR LYMPH EXTRAV  INC GUIDE ROADMAPPING  02/16/2022   IR THORACENTESIS ASP PLEURAL SPACE W/IMG GUIDE  02/21/2022  IR THORACENTESIS ASP PLEURAL SPACE W/IMG GUIDE  02/22/2022   IR US  GUIDE VASC ACCESS RIGHT  02/16/2022   JOINT REPLACEMENT     LAPAROSCOPIC CHOLECYSTECTOMY  11/11/2012   LAPAROSCOPIC LYSIS OF ADHESIONS N/A 11/11/2012   Procedure: LAPAROSCOPIC LYSIS OF ADHESIONS;  Surgeon: Donnice POUR. Belinda, MD;  Location: [facility] OR;  Service: General;  Laterality: N/A;   NEPHRECTOMY Right 06/2001    S/P RENAL CELL CANCER   PERIPHERAL VASCULAR INTERVENTION Bilateral 02/12/2021   Procedure: PERIPHERAL VASCULAR INTERVENTION;  Surgeon: Sheree Penne Bruckner, MD;  Location: [facility] INVASIVE CV LAB;  Service: Cardiovascular;  Laterality: Bilateral;  Iliac artery stents   PRESSURE SENSOR/CARDIOMEMS N/A 02/03/2019   Procedure: PRESSURE SENSOR/CARDIOMEMS;  Surgeon: [name], MD;  Location: [facility] INVASIVE CV LAB;  Service: Cardiovascular;  Laterality: N/A;   RIGHT HEART CATH N/A 11/09/2018   Procedure: RIGHT HEART CATH;  Surgeon: [name], MD;  Location: [facility] INVASIVE CV LAB;  Service: Cardiovascular;  Laterality: N/A;   RIGHT HEART CATH N/A 03/08/2019   Procedure: RIGHT HEART CATH;  Surgeon: [name], MD;  Location: [facility] INVASIVE CV LAB;  Service: Cardiovascular;  Laterality: N/A;   SUBMUCOSAL TATTOO INJECTION  02/28/2019   Procedure: SUBMUCOSAL TATTOO INJECTION;  Surgeon: Teressa Toribio SQUIBB, MD;  Location: [facility] ENDOSCOPY;  Service: Endoscopy;;   SUBMUCOSAL TATTOO INJECTION  11/06/2022   Procedure: SUBMUCOSAL TATTOO INJECTION;  Surgeon: Wilhelmenia Aloha Raddle., MD;  Location: [facility] ENDOSCOPY;  Service: Gastroenterology;;   TOTAL HIP  ARTHROPLASTY Right 05/03/2011   Procedure: TOTAL HIP ARTHROPLASTY ANTERIOR APPROACH;  Surgeon: Bruckner CINDERELLA Poli;  Location: [facility] ORS;  Service: Orthopedics;  Laterality: Right;  Removal of Cannulated Screws Right Hip, Right Direct Anterior Hip Replacement   TOTAL HIP ARTHROPLASTY Left 10/09/2018   Procedure: TOTAL HIP ARTHROPLASTY ANTERIOR APPROACH;  Surgeon: Fidel Rogue, MD;  Location: [facility] OR;  Service: Orthopedics;  Laterality: Left;    Social History Social History   Tobacco Use   Smoking status: Former    Current packs/day: 0.00    Average packs/day: 0.5 packs/day for 40.0 years (20.0 ttl pk-yrs)    Types: Cigarettes    Start date: 05/06/1961    Quit date: 05/06/2001    Years since quitting: 22.6   Smokeless tobacco: Never  Vaping Use   Vaping status: Never Used  Substance Use Topics   Alcohol use: No    Alcohol/week: 0.0 standard drinks of alcohol   Drug use: No    Family History Family History  Problem Relation Age of Onset   Heart failure Mother    Early death Father        car accident   Breast cancer Maternal Aunt 71   Breast cancer Cousin    Breast cancer Other    Colon cancer Neg Hx    Esophageal cancer Neg Hx    Liver disease Neg Hx    Pancreatic cancer Neg Hx    Rectal cancer Neg Hx     Allergies  Allergen Reactions   Codeine Nausea And Vomiting and Other (See Comments)    Hallucinations, too- CANNOT HAVE; thinks she may have received Narcan  to reverse the effect   Dilaudid  [Hydromorphone ] Other (See Comments)    Excessive Somnolence- Required Narcan  X2   Tape Other (See Comments)    CAN TOLERATE ONLY EASY-RELEASE, PAPER TAPE, AS THE SKIN IS VERY THIN AND WILL BRUISE AND TEAR VERY EASILY   Amoxil  [Amoxicillin ] Nausea Only   Asa [Aspirin ] Other (See Comments)  Told to avoid due to reduced kidney function, has 1 remaining kidney   Ms Contin  [Morphine ] Nausea And Vomiting and Other (See Comments)    Hallucinations, also   Neurontin  [Gabapentin ]  Other (See Comments)    Out of it   Nsaids Other (See Comments)    Told to avoid due to reduced kidney function, has 1 remaining kidney     REVIEW OF SYSTEMS (Negative unless checked)  Constitutional: [] Weight loss  [] Fever  [] Chills Cardiac: [] Chest pain   [] Chest pressure   [] Palpitations   [] Shortness of breath when laying flat   [] Shortness of breath at rest   [x] Shortness of breath with exertion. Vascular:  [] Pain in legs with walking   [x] Pain in legs at rest   [] Pain in legs when laying flat   [] Claudication   [] Pain in feet when walking  [x] Pain in feet at rest  [] Pain in feet when laying flat   [] History of DVT   [] Phlebitis   [] Swelling in legs   [] Varicose veins   [x] Non-healing ulcers Pulmonary:   [] Uses home oxygen    [] Productive cough   [] Hemoptysis   [] Wheeze  [] COPD   [] Asthma Neurologic:  [] Dizziness  [] Blackouts   [] Seizures   [] History of stroke   [] History of TIA  [] Aphasia   [] Temporary blindness   [] Dysphagia   [] Weakness or numbness in arms   [] Weakness or numbness in legs Musculoskeletal:  [] Arthritis   [] Joint swelling   [] Joint pain   [] Low back pain Hematologic:  [] Easy bruising  [] Easy bleeding   [] Hypercoagulable state   [] Anemic  [] Hepatitis Gastrointestinal:  [x] Blood in stool   [] Vomiting blood  [] Gastroesophageal reflux/heartburn   [] Difficulty swallowing. Genitourinary:  [x] Chronic kidney disease   [] Difficult urination  [] Frequent urination  [] Burning with urination   [] Blood in urine Skin:  [] Rashes   [x] Ulcers   [x] Wounds Psychological:  [x] History of anxiety   []  History of major depression.  Physical Examination  Vitals:   01/10/24 1521 01/10/24 2111 01/11/24 0455 01/11/24 0834  BP: 101/66 125/61 124/65 (!) 112/56  Pulse: 61 (!) 59 60 (!) 59  Resp: 17 18 18 17   Temp: (!) 97.5 F (36.4 C) 98.2 F (36.8 C) 98.6 F (37 C) 98.3 F (36.8 C)  TempSrc: Oral   Oral  SpO2: 96% 100% 100% 99%  Weight:      Height:       Body mass index is 20.29  kg/m. Gen:  WD/WN, NAD Head: [place]/AT, No temporalis wasting. Prominent temp pulse not noted. Ear/Nose/Throat: Hearing grossly intact, nares w/o erythema or drainage, oropharynx w/o Erythema/Exudate Eyes: Sclera non-icteric, conjunctiva clear Neck: Trachea midline.  No JVD.  Pulmonary:  Good air movement, respirations not labored, equal bilaterally.  Cardiac: RRR, normal S1, S2. Vascular:  Vessel Right Left  Radial Palpable Palpable  Ulnar Palpable Palpable  Brachial Palpable Palpable  Carotid Palpable, without bruit Palpable, without bruit  Aorta Not palpable N/A  Femoral Palpable Palpable  Popliteal  Possibly Palpable Palpable  PT Non-Palpable Non-Palpable  DP Non-Palpable Non-Palpable   Gastrointestinal: soft, non-tender/non-distended. No guarding/reflex.  Musculoskeletal: M/S 5/5 throughout.  Extremities without ischemic changes.  No deformity or atrophy. No edema. Neurologic: Sensation grossly intact in extremities.  Symmetrical.  Speech is fluent. Motor exam as listed above. Psychiatric: Judgment intact, Mood & affect appropriate for pt's clinical situation. Dermatologic: Severe bilateral lower leg and foot hyperkeratosis.  Deep ulcers of left lateral foot and anterior lower leg.  No visible cellulitis around the open  wounds.  No purulence.  Dry. Lymph : No Cervical, Axillary, or Inguinal lymphadenopathy.     CBC Lab Results  Component Value Date   WBC 4.2 01/11/2024   HGB 9.1 (L) 01/11/2024   HCT 31.0 (L) 01/11/2024   MCV 81.8 01/11/2024   PLT 176 01/11/2024    BMET    Component Value Date/Time   NA 134 (L) 01/11/2024 0438   NA 139 11/26/2023 1459   K 3.7 01/11/2024 0438   CL 98 01/11/2024 0438   CO2 27 01/11/2024 0438   GLUCOSE 95 01/11/2024 0438   BUN 53 (H) 01/11/2024 0438   BUN 56 (H) 11/26/2023 1459   CREATININE 1.66 (H) 01/11/2024 0438   CREATININE 2.39 (H) 10/01/2023 1505   CREATININE 2.12 (H) 06/23/2023 1518   CALCIUM  8.4 (L) 01/11/2024 0438    GFRNONAA 30 (L) 01/11/2024 0438   GFRNONAA 19 (L) 10/01/2023 1505   GFRAA 39 (L) 12/23/2019 1458   Estimated Creatinine Clearance: 21.5 mL/min (A) (by C-G formula based on SCr of 1.66 mg/dL (H)).  COAG Lab Results  Component Value Date   INR 1.2 09/03/2023   INR 1.1 01/30/2023   INR 1.2 02/13/2022    Radiology US  ARTERIAL ABI (SCREENING LOWER EXTREMITY) Result Date: 01/11/2024 CLINICAL DATA:  Peripheral arterial disease. Wounds on both legs. Left leg could not be evaluated due to wounds and patient pain. EXAM: NONINVASIVE PHYSIOLOGIC VASCULAR STUDY OF BILATERAL LOWER EXTREMITIES TECHNIQUE: Evaluation of both lower extremities were performed at rest, including calculation of ankle-brachial indices with single level Doppler, pressure and pulse volume recording. COMPARISON:  None Available. FINDINGS: Right ABI:  0.78 Left ABI:  Not evaluated Right Lower Extremity:  Irregular waveforms at the right ankle. Left Lower Extremity:  Not evaluated 0.5-0.79 Moderate PAD IMPRESSION: 1. Right ankle-brachial index is 0.78 suggesting moderate peripheral arterial disease. 2. Left leg could not be evaluated. Consider further evaluation with CTA or MRA if needed. Electronically Signed   By: Juliene Balder M.D.   On: 01/11/2024 08:11   US  Venous Img Lower Bilateral Result Date: 01/09/2024 EXAM: ULTRASOUND DUPLEX OF THE BILATERAL LOWER EXTREMITY VEINS TECHNIQUE: Duplex ultrasound using B-mode/Tywone Bembenek scaled imaging and Doppler spectral analysis and color flow was obtained of the deep venous structures of the bilateral lower extremity. COMPARISON: None. CLINICAL HISTORY: Cancer patient, worsening redness of bilateral lower extremities, swelling. FINDINGS: The visualized veins of the lower extremity are patent and free of echogenic thrombus. Limited views of the veins in the area of lower extremity wounds. The veins demonstrate good compressibility with normal color flow study and spectral analysis. IMPRESSION: 1. No evidence  of DVT. Electronically signed by: Norman Gatlin MD 01/09/2024 06:58 PM EDT RP Workstation: [id number]   DG Tibia/Fibula Left Result Date: 01/09/2024 CLINICAL DATA:  Open wound, erythema and swelling EXAM: LEFT TIBIA AND FIBULA - 2 VIEW COMPARISON:  01/01/2024 FINDINGS: Frontal and lateral views of the left tibia and fibula are obtained. The bones are diffusely osteopenic. There are no acute or destructive bony abnormalities. Specifically, no evidence of bony destruction or periosteal reaction to suggest osteomyelitis. Diffuse subcutaneous edema is again identified, with a large soft tissue defect within the anterior aspect of the distal left lower leg unchanged. No evidence of subcutaneous gas extending from the soft tissue defect. There are no radiopaque foreign bodies. IMPRESSION: 1. Large soft tissue defect within the distal left lower leg unchanged, with persistent diffuse subcutaneous edema. 2. No acute or destructive bony abnormalities. Electronically Signed   By:  Ozell Daring M.D.   On: 01/09/2024 18:18   DG Tibia/Fibula Left Result Date: 01/08/2024 EXAM: 3 VIEW(S) XRAY OF THE LEFT TIBIA AND FIBULA 01/01/2024 01:43:00 PM COMPARISON: Left ankle series dated 02/08/2001. CLINICAL HISTORY: r/o osteomyelitis. Venous ulcer, r/o osteomyelitis. Wounds on distal left tib/fib and lateral left foot. FINDINGS: BONES AND JOINTS: No acute fracture. No focal osseous lesion. No joint dislocation. Benign-appearing periosteal reactions along the shafts of the tibia and fibula. No osseous erosions. SOFT TISSUES: Ulcerative lesion present anteriorly within the lower calf. Mild diffuse soft tissue swelling. IMPRESSION: 1. No evidence of osteomyelitis. 2. Benign-appearing periosteal reactions along the shafts of the tibia and fibula. 3. Ulcerative lesion present anteriorly within the lower calf. 4. Mild diffuse soft tissue swelling. Electronically signed by: Evalene Coho MD 01/08/2024 01:00 PM EDT RP Workstation:  [id number]   DG Foot Complete Left Result Date: 01/08/2024 EXAM: 3 or more VIEW(S) XRAY OF THE LEFT FOOT 01/01/2024 01:43:00 PM COMPARISON: 11/10/2019 CLINICAL HISTORY: r/o osteomyelitis. Venous ulcer, r/o osteomyelitis. Wounds on distal left tib/fib and lateral left foot. FINDINGS: BONES AND JOINTS: The bones of the left foot appeared diffusely osteopenic but there are no erosions evident. No acute fracture. No focal osseous lesion. No joint dislocation. SOFT TISSUES: There is soft tissue gas present lateral to the fifth tarsometatarsal joint. IMPRESSION: 1. No acute findings of osteomyelitis. 2. Soft tissue gas lateral to the fifth tarsometatarsal joint. 3. Diffuse osteopenia without erosions. Electronically signed by: Evalene Coho MD 01/08/2024 12:58 PM EDT RP Workstation: [id number]      Assessment/Plan 1. Hyperkeratotic bilateral lower leg and foot skin lesions.  The hyperkeratosis can be treated with topical Urea or Ammonium Lactate containing salves.  Would apply compression over this.  Surprisingly does not have much edema.  The left leg has two deep ulcers the leg one may erode into the anterior compartment.  Intraoperative debridement would be appropriate to get this cleaned up.  Not sure what the etiology is.  She has PVD but I do not think this is primarily a vascular etiology.  The toes are mostly spared and the calves are as bad or worse than the feet. 2. Neuroendocrine tumor on Avastin  with ongoing GIB and transfusions which Avastin  has ameliorated.  Ask Oncology what the implications are for debridements in face of this therapy.  I do not know. 3. CKD stage 3, uninephric.   Norleen LITTIE Pereyra, MD  01/11/2024 1:22 PM

## 2024-01-11 NOTE — Progress Notes (Signed)
 PROGRESS NOTE    [name]  FMW:[id number] DOB: [birth year] DOA: 01/09/2024 PCP: [name], MD  Chief Complaint  Patient presents with   Wound Infection    Hospital Course:  [name] is a 89 y.o. 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 Patient has 2 lower extremity ulcers, poor healing.  Has been on antibiotics, cultures growing MRSA and Corynebacterium, was sent to the hospital for IV antibiotics.    Subjective: Patient was examined at the bedside, states she feels tired.  States tramadol  25 mg did not help her with pain Continue IV antibiotics, seen by vascular surgery, appreciate recs   Objective: Vitals:   01/10/24 2111 01/11/24 0455 01/11/24 0834 01/11/24 1520  BP: 125/61 124/65 (!) 112/56 121/66  Pulse: (!) 59 60 (!) 59 (!) 59  Resp: 18 18 17 19   Temp: 98.2 F (36.8 C) 98.6 F (37 C) 98.3 F (36.8 C) 97.8 F (36.6 C)  TempSrc:   Oral Oral  SpO2: 100% 100% 99% 100%  Weight:      Height:        Intake/Output Summary (Last 24 hours) at 01/11/2024 1636 Last data filed at 01/11/2024 1300 Gross per 24 hour  Intake 340 ml  Output 1500 ml  Net -1160 ml   Filed Weights   01/09/24 2145  Weight: 57 kg    Examination: Constitutional: Chronically ill looking, NAD, calm, comfortable Neck: normal, supple, no masses, no thyromegaly Respiratory: clear to auscultation bilaterally,  Normal respiratory effort. No accessory muscle use.   Cardiovascular: Regular rate and rhythm, no murmurs / rubs / gallops. No extremity edema. 2+ pedal pulses. No carotid bruits.  Abdomen: no tenderness, no masses palpated. No hepatosplenomegaly. Bowel sounds positive.  Musculoskeletal: Good range of motion, no joint swelling or tenderness, Skin: Bilateral lower extremity chronic stasis dermatitis with 2 open wounds which are deep about 5 x 6 cm in diameter with crusted base Neurologic: CN 2-12 grossly intact. Sensation intact, DTR normal. Strength 5/5 in all 4  Assessment & Plan:  Principal Problem:   Leg wound, left, initial encounter Active Problems:   Cellulitis of left lower extremity   Acute on chronic diastolic CHF (congestive heart failure) (HCC)   Normocytic anemia   Primary pancreatic neuroendocrine tumor   Hyperlipidemia   HOCM/IHSS   Atrial fibrillation, permanent (HCC)   COPD (chronic obstructive pulmonary disease) (HCC)   Acute on chronic kidney failure (HCC)   ICD (implantable cardioverter-defibrillator) in place   Hypertension   PAD (peripheral artery disease) (HCC)   History of nephrectomy secondary to RCC   Pulmonary hypertension (HCC)  Infected left lower extremity wound with surrounding cellulitis - Patient has outpatient culture apparently growing MRSA and corynebacterium - Lactic acid normal, procalcitonin not elevated, no leukocytosis on presentation. - XR left tibia/fibula large soft tissue defect within the distal left lower leg unchanged, persistent diffuse subcutaneous edema.  No acute/destructive bony abnormalities - US  negative for DVT bilaterally, US  ABI right ABI 0.78, left could not be evaluated - On IV Zyvox , cefepime , will discuss with ID on-call - Follow-up blood cultures - Seen by WOC,  recommend debridement.   - Seen by vascular surgery, appreciate recs, intraoperative debridement would be appropriate, unclear etiology.  Will discuss with oncology the implications of debridement as patient is on  Avastin  - Seen by orthopedics, appreciate recs.  No signs of bone involvement - Pain management with Tylenol , tramadol  (inc 50 q12)  Hypokalemia - resolved -magnesium  normal - Resume home supplements  AKI on CKD 3b/IV - Cr 2.22, improved 1.66, s/p IV fluids, Baseline Cr 1.7-1.9 - Monitor Cr  Normocytic anemia - Monitor Hb   Chronic diastolic heart failure Hypertrophic cardiomyopathy Pulmonary HTN - Continue home Torsemide  and Metolazone   COPD with chronic hypoxic respiratory failure On home oxygen  2L [place] Continue home inhalers   Hx pancreatic neuroendocrine cancer Patient is on chemotherapy.  Continue follow-up with oncologist outpatient   Hyperlipidemia: Continue with statin   CAD: Status post ICD placement.  Continue to monitor   Peripheral vascular disease: Will continue home regimen.   DVT prophylaxis: Lovenox  SQ   Code Status: Limited: Do not attempt resuscitation (DNR) -DNR-LIMITED -Do Not Intubate/DNI  Disposition:  TBD  Consultants:  Orthopedic surgery Infectious disease Vascular surgery  Procedures:  None  Antimicrobials:  Anti-infectives (From admission, onward)    Start     Dose/Rate Route Frequency Ordered Stop   01/09/24 2200  linezolid  (ZYVOX ) IVPB 600 mg        600 mg 300 mL/hr over 60 Minutes Intravenous Every 12 hours 01/09/24 1759     01/09/24 1930  ceFEPIme  (MAXIPIME ) 2 g in sodium chloride  0.9 % 100 mL IVPB        2 g 200 mL/hr over 30 Minutes Intravenous Every 24 hours 01/09/24 1915         Data Reviewed: I have personally reviewed following labs and imaging studies CBC: Recent Labs  Lab 01/07/24 1355 01/09/24 1420 01/09/24 1947 01/10/24 0712 01/11/24 0438  WBC 5.2 5.0 6.4 3.7* 4.2  NEUTROABS 3.9 3.8  --   --   --   HGB 9.9* 9.5* 9.2* 9.1* 9.1*  HCT 32.6* 32.0* 30.8* 30.1* 31.0*  MCV 78.2* 80.8 81.3 79.8* 81.8  PLT 217 208 196 186 176   Basic Metabolic Panel: Recent Labs  Lab 01/09/24 1420 01/09/24 1947 01/10/24 0712  01/11/24 0438  NA 137  --  137 134*  K 3.4*  --  3.4* 3.7  CL 94*  --  102 98  CO2 28  --  27 27  GLUCOSE 144*  --  145* 95  BUN 65*  --  60* 53*  CREATININE 1.83* 2.22* 1.78* 1.66*  CALCIUM  9.3  --  8.4* 8.4*  MG  --   --  2.1  --    GFR: Estimated Creatinine Clearance: 21.5 mL/min (A) (by C-G formula based on SCr of 1.66 mg/dL (H)). Liver Function Tests: Recent Labs  Lab 01/09/24 1835 01/10/24 0712  AST 21 27  ALT 8 12  ALKPHOS 56 53  BILITOT 0.8 0.6  PROT 8.3* 6.5  ALBUMIN  4.0 3.1*   CBG: No results for input(s): GLUCAP in the last 168 hours.  Recent Results (from the past 240 hours)  Aerobic culture     Status: Abnormal   Collection Time: 01/01/24  6:28 PM   Specimen: Skin, Other  Result Value Ref Range Status   Aerobic Bacterial Culture Final report (A)  Final   Organism ID, Bacteria Comment (A)  Final    Comment: Methicillin - resistant Staphylococcus aureus Light growth Based on resistance to oxacillin  this isolate would be resistant to all currently available beta-lactam antimicrobial agents, with the exception of the newer cephalosporins with anti-MRSA activity, such as Ceftaroline    Organism ID, Bacteria Comment (A)  Final    Comment: Corynebacterium striatum Heavy growth Susceptibility not normally performed on this organism.    Antimicrobial Susceptibility Comment  Final    Comment:       ** S = Susceptible; I = Intermediate; R = Resistant **                    P = Positive; N = Negative             MICS are expressed in micrograms per mL    Antibiotic                 RSLT#1    RSLT#2    RSLT#3    RSLT#4 Ciprofloxacin                   R Clindamycin                    R Erythromycin                   R Gentamicin                      S Levofloxacin                    R Linezolid                       S Oxacillin                      R Penicillin                     R Rifampin                       S Tetracycline                    R Trimethoprim /Sulfa             R Vancomycin                      S   Blood culture (routine x 2)     Status: None (Preliminary result)   Collection Time: 01/09/24  6:34 PM   Specimen: BLOOD  Result Value Ref Range Status   Specimen Description BLOOD LEFT ANTECUBITAL  Final   Special Requests   Final    BOTTLES DRAWN AEROBIC AND ANAEROBIC Blood Culture results may not be optimal due to an inadequate volume of blood received in culture bottles   Culture   Final    NO GROWTH 2 DAYS Performed at [facility], [address] [place], [place] [address]    Report Status PENDING  Incomplete  Blood culture (routine x 2)     Status: None (Preliminary result)   Collection Time: 01/09/24  9:56 PM   Specimen: BLOOD  Result Value Ref Range Status   Specimen Description BLOOD BLOOD RIGHT ARM  Final   Special Requests   Final    BOTTLES DRAWN AEROBIC AND ANAEROBIC Blood Culture results may not be optimal due to an inadequate volume of blood received in culture bottles   Culture   Final    NO  GROWTH 2 DAYS Performed at [facility], [address] [place], [place] [address]    Report Status PENDING  Incomplete     Radiology Studies: US  ARTERIAL ABI (SCREENING LOWER EXTREMITY) Result Date: 01/11/2024 CLINICAL DATA:  Peripheral arterial disease. Wounds on both legs. Left leg could not be evaluated due to wounds and patient pain. EXAM: NONINVASIVE PHYSIOLOGIC VASCULAR STUDY OF BILATERAL LOWER EXTREMITIES TECHNIQUE: Evaluation of both lower extremities were performed at rest, including calculation of ankle-brachial indices with single level Doppler, pressure and pulse volume recording. COMPARISON:  None Available. FINDINGS: Right ABI:  0.78 Left ABI:  Not evaluated Right Lower Extremity:  Irregular waveforms at the right ankle. Left Lower Extremity:  Not evaluated 0.5-0.79 Moderate PAD IMPRESSION: 1. Right ankle-brachial index is 0.78 suggesting moderate peripheral arterial disease.  2. Left leg could not be evaluated. Consider further evaluation with CTA or MRA if needed. Electronically Signed   By: Juliene Balder M.D.   On: 01/11/2024 08:11   US  Venous Img Lower Bilateral Result Date: 01/09/2024 EXAM: ULTRASOUND DUPLEX OF THE BILATERAL LOWER EXTREMITY VEINS TECHNIQUE: Duplex ultrasound using B-mode/gray scaled imaging and Doppler spectral analysis and color flow was obtained of the deep venous structures of the bilateral lower extremity. COMPARISON: None. CLINICAL HISTORY: Cancer patient, worsening redness of bilateral lower extremities, swelling. FINDINGS: The visualized veins of the lower extremity are patent and free of echogenic thrombus. Limited views of the veins in the area of lower extremity wounds. The veins demonstrate good compressibility with normal color flow study and spectral analysis. IMPRESSION: 1. No evidence of DVT. Electronically signed by: Norman Gatlin MD 01/09/2024 06:58 PM EDT RP Workstation: [id number]   DG Tibia/Fibula Left Result Date: 01/09/2024 CLINICAL DATA:  Open wound, erythema and swelling EXAM: LEFT TIBIA AND FIBULA - 2 VIEW COMPARISON:  01/01/2024 FINDINGS: Frontal and lateral views of the left tibia and fibula are obtained. The bones are diffusely osteopenic. There are no acute or destructive bony abnormalities. Specifically, no evidence of bony destruction or periosteal reaction to suggest osteomyelitis. Diffuse subcutaneous edema is again identified, with a large soft tissue defect within the anterior aspect of the distal left lower leg unchanged. No evidence of subcutaneous gas extending from the soft tissue defect. There are no radiopaque foreign bodies. IMPRESSION: 1. Large soft tissue defect within the distal left lower leg unchanged, with persistent diffuse subcutaneous edema. 2. No acute or destructive bony abnormalities. Electronically Signed   By: Ozell Daring M.D.   On: 01/09/2024 18:18    Scheduled Meds:  enoxaparin  (LOVENOX ) injection  30  mg Subcutaneous Q24H   feeding supplement  237 mL Oral BID BM   leptospermum manuka honey  1 Application Topical Daily   [START ON 01/12/2024] metolazone   2.5 mg Oral Once per day on Monday Wednesday Friday   potassium chloride  SA  20 mEq Oral BID   rosuvastatin   10 mg Oral Daily   torsemide   100 mg Oral BID   Continuous Infusions:  ceFEPime  (MAXIPIME ) IV 2 g (01/10/24 1843)   linezolid  (ZYVOX ) IV 600 mg (01/11/24 0915)     LOS: 2 days  MDM: Patient is high risk for one or more organ failure.  They necessitate ongoing hospitalization for continued IV therapies and subsequent lab monitoring. Total time spent interpreting labs and vitals, reviewing the medical record, coordinating care amongst consultants and care team members, directly assessing and discussing care with the patient and/or family: 55 min  Laree Lock, MD [facility]  To  contact the attending physician between 7A-7P please use Epic Chat. To contact the covering physician during after hours 7P-7A, please review Amion.  01/11/2024, 4:36 PM   *This document has been created with the assistance of dictation software. Please excuse typographical errors. *

## 2024-01-11 NOTE — Progress Notes (Signed)
 Patient refused wound care as ordered. Educated patient about importance of wound care still refusing

## 2024-01-11 NOTE — Plan of Care (Signed)
   Problem: Health Behavior/Discharge Planning: Goal: Ability to manage health-related needs will improve Outcome: Progressing   Problem: Clinical Measurements: Goal: Ability to maintain clinical measurements within normal limits will improve Outcome: Progressing

## 2024-01-11 NOTE — Plan of Care (Signed)
   Problem: Health Behavior/Discharge Planning: Goal: Ability to manage health-related needs will improve Outcome: Progressing

## 2024-01-12 ENCOUNTER — Inpatient Hospital Stay

## 2024-01-12 ENCOUNTER — Encounter: Payer: Self-pay | Admitting: Internal Medicine

## 2024-01-12 ENCOUNTER — Ambulatory Visit: Admitting: Physician Assistant

## 2024-01-12 DIAGNOSIS — Z905 Acquired absence of kidney: Secondary | ICD-10-CM

## 2024-01-12 DIAGNOSIS — L97929 Non-pressure chronic ulcer of unspecified part of left lower leg with unspecified severity: Secondary | ICD-10-CM

## 2024-01-12 DIAGNOSIS — I872 Venous insufficiency (chronic) (peripheral): Secondary | ICD-10-CM

## 2024-01-12 DIAGNOSIS — I110 Hypertensive heart disease with heart failure: Secondary | ICD-10-CM

## 2024-01-12 DIAGNOSIS — L03116 Cellulitis of left lower limb: Secondary | ICD-10-CM | POA: Diagnosis not present

## 2024-01-12 DIAGNOSIS — L97919 Non-pressure chronic ulcer of unspecified part of right lower leg with unspecified severity: Secondary | ICD-10-CM

## 2024-01-12 DIAGNOSIS — I509 Heart failure, unspecified: Secondary | ICD-10-CM

## 2024-01-12 DIAGNOSIS — B9562 Methicillin resistant Staphylococcus aureus infection as the cause of diseases classified elsewhere: Secondary | ICD-10-CM

## 2024-01-12 DIAGNOSIS — I739 Peripheral vascular disease, unspecified: Secondary | ICD-10-CM

## 2024-01-12 DIAGNOSIS — Z22322 Carrier or suspected carrier of Methicillin resistant Staphylococcus aureus: Secondary | ICD-10-CM

## 2024-01-12 DIAGNOSIS — Z95810 Presence of automatic (implantable) cardiac defibrillator: Secondary | ICD-10-CM

## 2024-01-12 DIAGNOSIS — L819 Disorder of pigmentation, unspecified: Secondary | ICD-10-CM | POA: Diagnosis not present

## 2024-01-12 DIAGNOSIS — S81802A Unspecified open wound, left lower leg, initial encounter: Secondary | ICD-10-CM | POA: Diagnosis not present

## 2024-01-12 LAB — BASIC METABOLIC PANEL WITH GFR
Anion gap: 11 (ref 5–15)
BUN: 47 mg/dL — ABNORMAL HIGH (ref 8–23)
CO2: 23 mmol/L (ref 22–32)
Calcium: 8.5 mg/dL — ABNORMAL LOW (ref 8.9–10.3)
Chloride: 101 mmol/L (ref 98–111)
Creatinine, Ser: 1.82 mg/dL — ABNORMAL HIGH (ref 0.44–1.00)
GFR, Estimated: 27 mL/min — ABNORMAL LOW (ref >60)
Glucose, Bld: 95 mg/dL (ref 70–99)
Potassium: 4.4 mmol/L (ref 3.5–5.1)
Sodium: 135 mmol/L (ref 135–145)

## 2024-01-12 LAB — MAGNESIUM: Magnesium: 2.1 mg/dL (ref 1.7–2.4)

## 2024-01-12 NOTE — Consult Note (Addendum)
 WOC Nurse Consult Note: Reason for Consult: Requested to change the prescription for BLE wounds. Wound type: Full thickness and partial thickness. Historical of cancer. No etiology defined, needs further investigation. Pressure Injury POA: NA Measurement: Crusts on the right leg.  Left leg one wound 2 cm x 2 cm x 0.2 cm cover with scabs, dry wound bed. Mid leg 5 cm x 5 cm x 1 cm (crater cover by dry non viable tissue). Wound bed: 100% non viable tissue. Drainage (amount, consistency, odor) NONE Periwound: Crusts, scabs, dry skin. Addendum: consulted by [facility] team 09/06. Pt was not accepting any dressing prior my visit/ Discussed with daughter and nephew (by phone) about the prescriptions and the moisture balance. Dressing procedure/placement/frequency: Cleanse with Vashe [id number], not rinse ans allow to dry, not scrub. Apply Medihoney to wound beds daily, cover with silicone foam. Wrap with Kerlix roll guaze if pt allow.  Hydrated the skin with barrier cream available at clean storage.  WOC team will not plan to follow further. Please reconsult if further assistance is needed. Thank-you,  Lela Holm RN, CNS, [employer], MSN.  (Phone [phone])

## 2024-01-12 NOTE — Consult Note (Incomplete)
 NAME: [name]  DOB: [birth year]  MRN: [id number]  Date/Time: 01/12/2024 7:11 PM  REQUESTING PROVIDER: Dr. jerelene Subjective:  REASON FOR CONSULT: Leg wounds   [name] is a 89 y.o. female with a history of chronic leg wounds, pancreatic neuroendocrine tumor followed by Dr. Lanny, on Sandostatin , anemia, angiodysplasia with bleeding requiring multiple RBC transfusions and iron  transfusion, on bevacizumab , carotid artery stenosis, A-fib, CHF status post AICD, CAD, COPD, CKD, hypertension, squamous cell carcinoma of skin multiple areas, renal cell carcinoma status post right nephrectomy, right THA removal and revision in 2020, left THA, history of Morganella bacteremia in May 2025, chronic venous edema with ulceration, Was asked to go to the emergency department by her dermatologist as her culture she had taken from the wound was MRSA She was seen by her on 01/01/2024 and the differential for this wounds were thought to be either chronic venous disease with PAD and in the differential there was squamous of carcinoma, pyoderma gangrenosum but had to get a biopsy and tissue culture for That.  But because she was on bevacizumab  this could not be done. She had not gotten an x-ray and that did not show any osteomyelitis On 01/02/2024 patient was given cefdinir .  Prior to that was levofloxacin  for 14-day supply on 12/12/2022 given by the wound clinic.  On 01/09/2024 was BP of 128/74, temperature 98, pulse 60, sats 80 to 92%. WBCs 5, Hb 9.5, platelet 208 and creatinine of 1.83. I am asked to see the patient for the management of Past Medical History:  Diagnosis Date  . (HFpEF) heart failure with preserved ejection fraction (HCC)    EF=60-60%  . Actinic keratosis 01/17/2015   R forearm  . Adjustment disorder with anxiety   . Adverse effect of other narcotics, sequela    Intolerance to all narcotics  . Anti-Duffy antibodies present   . Aortic valve stenosis   . Arthritis    some in my hands  (11/11/2012)  . Atrial fibrillation, permanent (HCC)    Eliquis   . Atypical mole 03/25/2018   L forearm - severe  . Automatic implantable cardioverter-defibrillator in situ   . Avascular necrosis of hip (HCC) 05/03/2011  . Carotid artery stenosis 09/2007   a. 09/2007: 60-79% bilateral (stable); b. 10/2008: 40-59% R 60-79%   . Cellulitis of left lower extremity 07/13/2020  . CKD (chronic kidney disease), stage III (HCC)   . COPD (chronic obstructive pulmonary disease) (HCC)   . Coronary artery disease    non-obstructive by 2006 cath  . COVID-19 virus infection 02/12/2022  . Displaced fracture of left femoral neck (HCC) 10/09/2018  . GI bleed 03/28/2020   AVM  . High cholesterol   . Hypertension 05/20/2011  . Hypertr obst cardiomyop   . Hypotension, unspecified    cardiac cath 2006..nonobstructive CAD 30-40s lesions.SABRAETT 1/09 nondiagnostic due to poor HR response..Right Renal Cancer 2003  . Iron  deficiency anemia   . Long term (current) use of anticoagulants   . On home oxygen  therapy    3L [place]  . Osteoarthritis of right hip   . PAD (peripheral artery disease) (HCC)   . Pancreatic cancer (HCC)    sandostatin   . PONV (postoperative nausea and vomiting)   . Presence of permanent cardiac pacemaker   . Pulmonary HTN (HCC)   . RECTAL BLEEDING 10/13/2009   Qualifier: Diagnosis of  By: Kerman NP, Vina    . Red blood cell antibody positive with compatible PRBC difficult to obtain  Anti FYA (Duffy a) antibody. Must be transfused with PRB which are Duffy Antigen Negative and Crossmatch Compatible  . Renal cell carcinoma (HCC) 2005   s/p right nephrectomy  . Squamous cell carcinoma of skin 01/17/2015   R lat wrist  . Squamous cell carcinoma of skin 01/29/2018   R post upper leg - superficially invasive  . Squamous cell carcinoma of skin 03/25/2018   L lat foot  . Squamous cell carcinoma of skin 11/09/2020   left lat foot - EDC 01/01/21, recurrent 01/31/21 - MOHs 02/22/21  . Squamous  cell carcinoma of skin 12/19/2021   Right Posterior Medial Thigh, EDC  . Squamous cell carcinoma of skin 12/19/2021   SCC IS, L lat heel, EDC 02/04/2022  . Squamous cell carcinoma of skin 01/21/2022   L forearm, EDC 02/04/2022  . Squamous cell carcinoma of skin 01/21/2022   R lower leg below knee, EDC 02/04/2022  . Squamous cell carcinoma of skin 01/21/2022   SCCIS, R post heel, EDC 02/04/2022  . Squamous cell carcinoma of skin 07/01/2022   left lower abdomen, in situ, EDC  . Squamous cell carcinoma of skin 07/01/2022   left medial chest, EDC  . Urge incontinence   . Venous stasis of both lower extremities     Past Surgical History:  Procedure Laterality Date  . ABDOMINAL AORTOGRAM W/LOWER EXTREMITY N/A 02/12/2021   Procedure: ABDOMINAL AORTOGRAM W/LOWER EXTREMITY;  Surgeon: Sheree Penne Bruckner, MD;  Location: [facility] INVASIVE CV LAB;  Service: Cardiovascular;  Laterality: N/A;  . ABDOMINAL HYSTERECTOMY  1975   for benign causes  . APPENDECTOMY    . BI-VENTRICULAR PACEMAKER UPGRADE  05/04/2010  . BIOPSY  02/28/2019   Procedure: BIOPSY;  Surgeon: Teressa Toribio SQUIBB, MD;  Location: [facility] ENDOSCOPY;  Service: Endoscopy;;  . BIOPSY  03/04/2022   Procedure: BIOPSY;  Surgeon: Wilhelmenia Aloha Raddle., MD;  Location: [facility];  Service: Gastroenterology;;  . CARDIAC CATHETERIZATION  2006  . CARDIOVERSION N/A 02/20/2018   Procedure: CARDIOVERSION;  Surgeon: [name], MD;  Location: [facility] ORS;  Service: Cardiovascular;  Laterality: N/A;  . CARDIOVERSION N/A 03/27/2018   Procedure: CARDIOVERSION;  Surgeon: [name], MD;  Location: [facility] ORS;  Service: Cardiovascular;  Laterality: N/A;  . CATARACT EXTRACTION W/ INTRAOCULAR LENS  IMPLANT, BILATERAL  01/2006-02-2006  . CHOLECYSTECTOMY N/A 11/11/2012   Procedure: LAPAROSCOPIC CHOLECYSTECTOMY WITH INTRAOPERATIVE CHOLANGIOGRAM;  Surgeon: Donnice POUR. Belinda, MD;  Location: [facility] OR;  Service: General;  Laterality: N/A;  . COLONOSCOPY  WITH PROPOFOL  N/A 02/28/2019   Procedure: COLONOSCOPY WITH PROPOFOL ;  Surgeon: Teressa Toribio SQUIBB, MD;  Location: [facility] ENDOSCOPY;  Service: Endoscopy;  Laterality: N/A;  . ENTEROSCOPY N/A 03/30/2020   Procedure: ENTEROSCOPY;  Surgeon: San Sandor GAILS, DO;  Location: [facility] ENDOSCOPY;  Service: Gastroenterology;  Laterality: N/A;  . ENTEROSCOPY N/A 02/10/2021   Procedure: ENTEROSCOPY;  Surgeon: Rollin Dover, MD;  Location: [facility] ENDOSCOPY;  Service: Endoscopy;  Laterality: N/A;  . ENTEROSCOPY N/A 02/15/2022   Procedure: ENTEROSCOPY;  Surgeon: Federico Rosario BROCKS, MD;  Location: [facility] ENDOSCOPY;  Service: Gastroenterology;  Laterality: N/A;  . ENTEROSCOPY N/A 11/06/2022   Procedure: ENTEROSCOPY;  Surgeon: Wilhelmenia Aloha Raddle., MD;  Location: [facility] ENDOSCOPY;  Service: Gastroenterology;  Laterality: N/A;  . EP IMPLANTABLE DEVICE N/A 02/21/2016   Procedure: ICD Generator Changeout;  Surgeon: Elspeth BROCKS Sage, MD;  Location: [facility] INVASIVE CV LAB;  Service: Cardiovascular;  Laterality: N/A;  . ESOPHAGOGASTRODUODENOSCOPY N/A 03/04/2022   Procedure: ESOPHAGOGASTRODUODENOSCOPY (EGD);  Surgeon: Wilhelmenia Aloha Raddle.,  MD;  Location: [facility] ENDOSCOPY;  Service: Gastroenterology;  Laterality: N/A;  . ESOPHAGOGASTRODUODENOSCOPY (EGD) WITH PROPOFOL  N/A 02/28/2019   Procedure: ESOPHAGOGASTRODUODENOSCOPY (EGD) WITH PROPOFOL ;  Surgeon: Teressa Toribio SQUIBB, MD;  Location: [facility] ENDOSCOPY;  Service: Endoscopy;  Laterality: N/A;  . EUS N/A 03/04/2022   Procedure: UPPER ENDOSCOPIC ULTRASOUND (EUS) RADIAL;  Surgeon: Wilhelmenia Aloha Raddle., MD;  Location: [facility] ENDOSCOPY;  Service: Gastroenterology;  Laterality: N/A;  . FINE NEEDLE ASPIRATION N/A 03/04/2022   Procedure: FINE NEEDLE ASPIRATION (FNA) LINEAR;  Surgeon: Wilhelmenia Aloha Raddle., MD;  Location: [facility] ENDOSCOPY;  Service: Gastroenterology;  Laterality: N/A;  . GIVENS CAPSULE STUDY N/A 03/15/2019   Procedure: GIVENS CAPSULE STUDY;  Surgeon: Eda Iha, MD;  Location: [facility] ENDOSCOPY;   Service: Gastroenterology;  Laterality: N/A;  . HOT HEMOSTASIS N/A 03/30/2020   Procedure: HOT HEMOSTASIS (ARGON PLASMA COAGULATION/BICAP);  Surgeon: San Sandor GAILS, DO;  Location: [facility] ENDOSCOPY;  Service: Gastroenterology;  Laterality: N/A;  . HOT HEMOSTASIS N/A 02/10/2021   Procedure: HOT HEMOSTASIS (ARGON PLASMA COAGULATION/BICAP);  Surgeon: Rollin Dover, MD;  Location: [facility] ENDOSCOPY;  Service: Endoscopy;  Laterality: N/A;  . HOT HEMOSTASIS N/A 02/15/2022   Procedure: HOT HEMOSTASIS (ARGON PLASMA COAGULATION/BICAP);  Surgeon: Federico Rosario BROCKS, MD;  Location: [facility] ENDOSCOPY;  Service: Gastroenterology;  Laterality: N/A;  . HOT HEMOSTASIS N/A 11/06/2022   Procedure: HOT HEMOSTASIS (ARGON PLASMA COAGULATION/BICAP);  Surgeon: Wilhelmenia Aloha Raddle., MD;  Location: [facility] ENDOSCOPY;  Service: Gastroenterology;  Laterality: N/A;  . INSERT / REPLACE / REMOVE PACEMAKER  05/01/2011   02-28-05-/05-04-10-ICD-MEDTRONIC MAXIMAL DR  . IR ANGIOGRAM SELECTIVE EACH ADDITIONAL VESSEL  02/18/2022  . IR ANGIOGRAM SELECTIVE EACH ADDITIONAL VESSEL  02/18/2022  . IR ANGIOGRAM VISCERAL SELECTIVE  02/16/2022  . IR ANGIOGRAM VISCERAL SELECTIVE  02/16/2022  . IR EMBO ART  VEN HEMORR LYMPH EXTRAV  INC GUIDE ROADMAPPING  02/16/2022  . IR THORACENTESIS ASP PLEURAL SPACE W/IMG GUIDE  02/21/2022  . IR THORACENTESIS ASP PLEURAL SPACE W/IMG GUIDE  02/22/2022  . IR US  GUIDE VASC ACCESS RIGHT  02/16/2022  . JOINT REPLACEMENT    . LAPAROSCOPIC CHOLECYSTECTOMY  11/11/2012  . LAPAROSCOPIC LYSIS OF ADHESIONS N/A 11/11/2012   Procedure: LAPAROSCOPIC LYSIS OF ADHESIONS;  Surgeon: Donnice POUR. Belinda, MD;  Location: [facility] OR;  Service: General;  Laterality: N/A;  . NEPHRECTOMY Right 06/2001    S/P RENAL CELL CANCER  . PERIPHERAL VASCULAR INTERVENTION Bilateral 02/12/2021   Procedure: PERIPHERAL VASCULAR INTERVENTION;  Surgeon: Sheree Penne Bruckner, MD;  Location: [facility] INVASIVE CV LAB;  Service: Cardiovascular;  Laterality: Bilateral;   Iliac artery stents  . PRESSURE SENSOR/CARDIOMEMS N/A 02/03/2019   Procedure: PRESSURE SENSOR/CARDIOMEMS;  Surgeon: [name], MD;  Location: [facility] INVASIVE CV LAB;  Service: Cardiovascular;  Laterality: N/A;  . RIGHT HEART CATH N/A 11/09/2018   Procedure: RIGHT HEART CATH;  Surgeon: [name], MD;  Location: [facility] INVASIVE CV LAB;  Service: Cardiovascular;  Laterality: N/A;  . RIGHT HEART CATH N/A 03/08/2019   Procedure: RIGHT HEART CATH;  Surgeon: [name], MD;  Location: [facility] INVASIVE CV LAB;  Service: Cardiovascular;  Laterality: N/A;  . SUBMUCOSAL TATTOO INJECTION  02/28/2019   Procedure: SUBMUCOSAL TATTOO INJECTION;  Surgeon: Teressa Toribio SQUIBB, MD;  Location: [facility] ENDOSCOPY;  Service: Endoscopy;;  . SUBMUCOSAL TATTOO INJECTION  11/06/2022   Procedure: SUBMUCOSAL TATTOO INJECTION;  Surgeon: Wilhelmenia Aloha Raddle., MD;  Location: [facility] ENDOSCOPY;  Service: Gastroenterology;;  . TOTAL HIP ARTHROPLASTY Right 05/03/2011   Procedure: TOTAL HIP ARTHROPLASTY ANTERIOR APPROACH;  Surgeon:  Lonni CINDERELLA Poli;  Location: [facility] ORS;  Service: Orthopedics;  Laterality: Right;  Removal of Cannulated Screws Right Hip, Right Direct Anterior Hip Replacement  . TOTAL HIP ARTHROPLASTY Left 10/09/2018   Procedure: TOTAL HIP ARTHROPLASTY ANTERIOR APPROACH;  Surgeon: Fidel Rogue, MD;  Location: [facility] OR;  Service: Orthopedics;  Laterality: Left;    Social History   Socioeconomic History  . Marital status: Widowed    Spouse name: Not on file  . Number of children: 2  . Years of education: high school  . Highest education level: Not on file  Occupational History  . Occupation: Retired    Associate Professor: RETIRED  Tobacco Use  . Smoking status: Former    Current packs/day: 0.00    Average packs/day: 0.5 packs/day for 40.0 years (20.0 ttl pk-yrs)    Types: Cigarettes    Start date: 05/06/1961    Quit date: 05/06/2001    Years since quitting: 22.7  . Smokeless tobacco: Never  Vaping Use  . Vaping  status: Never Used  Substance and Sexual Activity  . Alcohol use: No    Alcohol/week: 0.0 standard drinks of alcohol  . Drug use: No  . Sexual activity: Not Currently  Other Topics Concern  . Not on file  Social History Narrative   Would desire CPR   07/11/20   From: the area   Living: with great-grandson - Norman (2003)   Work: retired - clerical      Family: 2 children - Devere and Elizabeth - 2 grand chlidren - 5 great grand children      Enjoys: spending time with friends - watch move, eat out, spend time      Exercise: walking around the house   Diet: not good, limits red meat, limits salt, low appetite      Safety   Seat belts: Yes    Guns: Yes  and secure   Safe in relationships: Yes    Social Drivers of Health   Financial Resource Strain: Low Risk  (11/06/2023)   Overall Financial Resource Strain (CARDIA)   . Difficulty of Paying Living Expenses: Not hard at all  Food Insecurity: No Food Insecurity (01/09/2024)   Hunger Vital Sign   . Worried About Programme researcher, broadcasting/film/video in the Last Year: Never true   . Ran Out of Food in the Last Year: Never true  Transportation Needs: No Transportation Needs (01/09/2024)   PRAPARE - Transportation   . Lack of Transportation (Medical): No   . Lack of Transportation (Non-Medical): No  Physical Activity: Insufficiently Active (11/06/2023)   Exercise Vital Sign   . Days of Exercise per Week: 3 days   . Minutes of Exercise per Session: 20 min  Stress: No Stress Concern Present (11/06/2023)   [employer] of Occupational Health - Occupational Stress Questionnaire   . Feeling of Stress: Not at all  Social Connections: Socially Isolated (01/09/2024)   Social Connection and Isolation Panel   . Frequency of Communication with Friends and Family: More than three times a week   . Frequency of Social Gatherings with Friends and Family: More than three times a week   . Attends Religious Services: Never   . Active Member of Clubs or Organizations: No    . Attends Banker Meetings: Never   . Marital Status: Widowed  Intimate Partner Violence: Not At Risk (01/09/2024)   Humiliation, Afraid, Rape, and Kick questionnaire   . Fear of Current or Ex-Partner: No   . Emotionally  Abused: No   . Physically Abused: No   . Sexually Abused: No    Family History  Problem Relation Age of Onset  . Heart failure Mother   . Early death Father        car accident  . Breast cancer Maternal Aunt 82  . Breast cancer Cousin   . Breast cancer Other   . Colon cancer Neg Hx   . Esophageal cancer Neg Hx   . Liver disease Neg Hx   . Pancreatic cancer Neg Hx   . Rectal cancer Neg Hx    Allergies  Allergen Reactions  . Codeine Nausea And Vomiting and Other (See Comments)    Hallucinations, too- CANNOT HAVE; thinks she may have received Narcan  to reverse the effect  . Dilaudid  [Hydromorphone ] Other (See Comments)    Excessive Somnolence- Required Narcan  X2  . Tape Other (See Comments)    CAN TOLERATE ONLY EASY-RELEASE, PAPER TAPE, AS THE SKIN IS VERY THIN AND WILL BRUISE AND TEAR VERY EASILY  . Amoxil  [Amoxicillin ] Nausea Only  . Asa [Aspirin ] Other (See Comments)    Told to avoid due to reduced kidney function, has 1 remaining kidney  . Ms Contin  [Morphine ] Nausea And Vomiting and Other (See Comments)    Hallucinations, also  . Neurontin  [Gabapentin ] Other (See Comments)    Out of it  . Nsaids Other (See Comments)    Told to avoid due to reduced kidney function, has 1 remaining kidney   I  Current Facility-Administered Medications  Medication Dose Route Frequency Provider Last Rate Last Admin  . acetaminophen  (TYLENOL ) tablet 650 mg  650 mg Oral Q6H PRN Ponnala, Shruthi, MD   650 mg at 01/12/24 1842  . ceFEPIme  (MAXIPIME ) 2 g in sodium chloride  0.9 % 100 mL IVPB  2 g Intravenous Q24H Elesa Perkins, RPH 200 mL/hr at 01/12/24 1823 2 g at 01/12/24 1823  . enoxaparin  (LOVENOX ) injection 30 mg  30 mg Subcutaneous Q24H Sim Re L,  MD   30 mg at 01/12/24 1821  . feeding supplement (ENSURE PLUS HIGH PROTEIN) liquid 237 mL  237 mL Oral BID BM Garba, Mohammad L, MD   237 mL at 01/12/24 1052  . leptospermum manuka honey (MEDIHONEY) paste 1 Application  1 Application Topical Daily Ponnala, Shruthi, MD      . linezolid  (ZYVOX ) IVPB 600 mg  600 mg Intravenous Q12H Nicholaus Maize E, MD 300 mL/hr at 01/12/24 1057 600 mg at 01/12/24 1057  . metolazone  (ZAROXOLYN ) tablet 2.5 mg  2.5 mg Oral Once per day on Monday Wednesday Friday Jerelene, Shruthi, MD   2.5 mg at 01/12/24 1053  . ondansetron  (ZOFRAN ) tablet 4 mg  4 mg Oral Q6H PRN Garba, Mohammad L, MD   4 mg at 01/10/24 1837   Or  . ondansetron  (ZOFRAN ) injection 4 mg  4 mg Intravenous Q6H PRN Sim Re CROME, MD      . potassium chloride  SA (KLOR-CON  M) CR tablet 20 mEq  20 mEq Oral BID Ponnala, Shruthi, MD   20 mEq at 01/12/24 1052  . prochlorperazine  (COMPAZINE ) injection 5 mg  5 mg Intravenous Q6H PRN Ponnala, Shruthi, MD      . rosuvastatin  (CRESTOR ) tablet 10 mg  10 mg Oral Daily Ponnala, Shruthi, MD   10 mg at 01/12/24 1053  . torsemide  (DEMADEX ) tablet 100 mg  100 mg Oral BID Ponnala, Shruthi, MD   100 mg at 01/12/24 1820  . traMADol  (ULTRAM ) tablet 50 mg  50  mg Oral Q12H PRN Jerelene Critchley, MD   50 mg at 01/12/24 1328     Abtx:  Anti-infectives (From admission, onward)    Start     Dose/Rate Route Frequency Ordered Stop   01/09/24 2200  linezolid  (ZYVOX ) IVPB 600 mg        600 mg 300 mL/hr over 60 Minutes Intravenous Every 12 hours 01/09/24 1759     01/09/24 1930  ceFEPIme  (MAXIPIME ) 2 g in sodium chloride  0.9 % 100 mL IVPB        2 g 200 mL/hr over 30 Minutes Intravenous Every 24 hours 01/09/24 1915         REVIEW OF SYSTEMS:  Const: negative fever, negative chills, negative weight loss Eyes: negative diplopia or visual changes, negative eye pain ENT: negative coryza, negative sore throat Resp: negative cough, hemoptysis, dyspnea Cards: negative for chest  pain, palpitations, lower extremity edema GU: negative for frequency, dysuria and hematuria GI: Negative for abdominal pain, diarrhea, bleeding, constipation Skin: negative for rash and pruritus Heme: negative for easy bruising and gum/nose bleeding MS: negative for myalgias, arthralgias, back pain and muscle weakness Neurolo:negative for headaches, dizziness, vertigo, memory problems  Psych: negative for feelings of anxiety, depression  Endocrine: negative for thyroid , diabetes Allergy/Immunology- negative for any medication or food allergies   Pertinent Positives include : Objective:  VITALS:  BP (!) 135/55 (BP Location: Right Arm)   Pulse 62   Temp (!) 97.5 F (36.4 C)   Resp 17   Ht 5' 6 (1.676 m)   Wt 57 kg   SpO2 100%   BMI 20.29 kg/m  LDA Foley Central line Other drainage tubes PHYSICAL EXAM:  General: Alert, cooperative, no distress, appears stated age.  Head: Normocephalic, without obvious abnormality, atraumatic. Eyes: Conjunctivae clear, anicteric sclerae. Pupils are equal ENT Nares normal. No drainage or sinus tenderness. Lips, mucosa, and tongue normal. No Thrush Neck: Supple, symmetrical, no adenopathy, thyroid : non tender no carotid bruit and no JVD. Back: No CVA tenderness. Lungs: Clear to auscultation bilaterally. No Wheezing or Rhonchi. No rales. Heart: Regular rate and rhythm, no murmur, rub or gallop. Abdomen: Soft, non-tender,not distended. Bowel sounds normal. No masses Extremities: atraumatic, no cyanosis. No edema. No clubbing Skin: No rashes or lesions. Or bruising Lymph: Cervical, supraclavicular normal. Neurologic: Grossly non-focal Pertinent Labs Lab Results CBC    Component Value Date/Time   WBC 4.2 01/11/2024 0438   RBC 3.79 (L) 01/11/2024 0438   HGB 9.1 (L) 01/11/2024 0438   HGB 9.9 (L) 01/07/2024 1355   HGB 8.1 (L) 06/09/2023 1456   HCT 31.0 (L) 01/11/2024 0438   HCT 28.9 (L) 06/09/2023 1456   PLT 176 01/11/2024 0438   PLT 217  01/07/2024 1355   PLT 256 06/09/2023 1456   MCV 81.8 01/11/2024 0438   MCV 84 06/09/2023 1456   MCH 24.0 (L) 01/11/2024 0438   MCHC 29.4 (L) 01/11/2024 0438   RDW 20.4 (H) 01/11/2024 0438   RDW 19.5 (H) 06/09/2023 1456   LYMPHSABS 0.6 (L) 01/09/2024 1420   LYMPHSABS 0.6 (L) 03/22/2019 1415   MONOABS 0.6 01/09/2024 1420   EOSABS 0.0 01/09/2024 1420   EOSABS 0.2 03/22/2019 1415   BASOSABS 0.0 01/09/2024 1420   BASOSABS 0.0 03/22/2019 1415       Latest Ref Rng & Units 01/12/2024    5:04 AM 01/11/2024    4:38 AM 01/10/2024    7:12 AM  CMP  Glucose 70 - 99 mg/dL 95  95  145   BUN 8 - 23 mg/dL 47  53  60   Creatinine 0.44 - 1.00 mg/dL 8.17  8.33  8.21   Sodium 135 - 145 mmol/L 135  134  137   Potassium 3.5 - 5.1 mmol/L 4.4  3.7  3.4   Chloride 98 - 111 mmol/L 101  98  102   CO2 22 - 32 mmol/L 23  27  27    Calcium  8.9 - 10.3 mg/dL 8.5  8.4  8.4   Total Protein 6.5 - 8.1 g/dL   6.5   Total Bilirubin 0.0 - 1.2 mg/dL   0.6   Alkaline Phos 38 - 126 U/L   53   AST 15 - 41 U/L   27   ALT 0 - 44 U/L   12       Microbiology: Recent Results (from the past 240 hours)  Blood culture (routine x 2)     Status: None (Preliminary result)   Collection Time: 01/09/24  6:34 PM   Specimen: BLOOD  Result Value Ref Range Status   Specimen Description BLOOD LEFT ANTECUBITAL  Final   Special Requests   Final    BOTTLES DRAWN AEROBIC AND ANAEROBIC Blood Culture results may not be optimal due to an inadequate volume of blood received in culture bottles   Culture   Final    NO GROWTH 3 DAYS Performed at [facility], [address], [place], [place] [address]    Report Status PENDING  Incomplete  Blood culture (routine x 2)     Status: None (Preliminary result)   Collection Time: 01/09/24  9:56 PM   Specimen: BLOOD  Result Value Ref Range Status   Specimen Description BLOOD BLOOD RIGHT ARM  Final   Special Requests   Final    BOTTLES DRAWN AEROBIC AND ANAEROBIC Blood Culture results  may not be optimal due to an inadequate volume of blood received in culture bottles   Culture   Final    NO GROWTH 3 DAYS Performed at [facility], [address] [place], [place] [address]    Report Status PENDING  Incomplete    Lines and Device Date on insertion # of days DC  Central line     Foley     ETT       IMAGING RESULTS: I have personally reviewed the films   Impression/Recommendation       ___I have personally spent  ---minutes involved in face-to-face and non-face-to-face activities for this patient on the day of the visit. Professional time spent includes the following activities: Preparing to see the patient (review of tests), Obtaining and/or reviewing separately obtained history (admission/discharge record), Performing a medically appropriate examination and/or evaluation , Ordering medications/tests/procedures, referring and communicating with other health care professionals, Documenting clinical information in the EMR, Independently interpreting results (not separately reported), Communicating results to the patient/family/caregiver, Counseling and educating the patient/family/caregiver and Care coordination (not separately reported).    ________________________________________________ Discussed with patient, requesting provider Note:  This document was prepared using Dragon voice recognition software and may include unintentional dictation errors.

## 2024-01-12 NOTE — Progress Notes (Signed)
 Pt pacemaker determined to be MR unsafe by cardiology (suzann Riddle). Do not proceed with MRI. Ordering provider made aware.

## 2024-01-12 NOTE — Consult Note (Signed)
 NAME: [name]  DOB: 1934-02-08  MRN: [id number]  Date/Time: 01/12/2024 7:11 PM  REQUESTING PROVIDER: Dr. jerelene Subjective:  REASON FOR CONSULT: Leg wounds ? [name] is a 89 y.o. female with a history of chronic leg wounds, pancreatic neuroendocrine tumor followed by Dr. Lanny, on Sandostatin , anemia, angiodysplasia with bleeding requiring multiple RBC transfusions and iron  transfusion, on bevacizumab , carotid artery stenosis, A-fib, CHF status post AICD, CAD, COPD, CKD, hypertension, squamous cell carcinoma of skin multiple areas, renal cell carcinoma status post right nephrectomy, right THA removal and revision in 2020, left THA, history of Morganella bacteremia in May 2025, chronic venous edema with ulceration, Was asked to go to the emergency department by her dermatologist as her culture she had taken from the wound was MRSA She was seen by her on 01/01/2024 and the differential for this wounds were thought to be either chronic venous disease with PAD and in the differential there was squamous of carcinoma, pyoderma gangrenosum but had to get a biopsy and tissue culture for That.  But because she was on bevacizumab  this could not be done. She had not gotten an x-ray and that did not show any osteomyelitis On 01/02/2024 patient was given cefdinir .  Prior to that was levofloxacin  for 14-day supply on 12/12/2022 given by the wound clinic.  On 01/09/2024 was BP of 128/74, temperature 98, pulse 60, sats 80 to 92%. WBCs 5, Hb 9.5, platelet 208 and creatinine of 1.83. I am asked to see the patient for the management of MRSA infection of the foot wounds Past Medical History:  Diagnosis Date   (HFpEF) heart failure with preserved ejection fraction (HCC)    EF=60-60%   Actinic keratosis 01/17/2015   R forearm   Adjustment disorder with anxiety    Adverse effect of other narcotics, sequela    Intolerance to all narcotics   Anti-Duffy antibodies present    Aortic valve stenosis     Arthritis    some in my hands (11/11/2012)   Atrial fibrillation, permanent (HCC)    Eliquis    Atypical mole 03/25/2018   L forearm - severe   Automatic implantable cardioverter-defibrillator in situ    Avascular necrosis of hip (HCC) 05/03/2011   Carotid artery stenosis 09/2007   a. 09/2007: 60-79% bilateral (stable); b. 10/2008: 40-59% R 60-79%    Cellulitis of left lower extremity 07/13/2020   CKD (chronic kidney disease), stage III (HCC)    COPD (chronic obstructive pulmonary disease) (HCC)    Coronary artery disease    non-obstructive by 2006 cath   COVID-19 virus infection 02/12/2022   Displaced fracture of left femoral neck (HCC) 10/09/2018   GI bleed 03/28/2020   AVM   High cholesterol    Hypertension 05/20/2011   Hypertr obst cardiomyop    Hypotension, unspecified    cardiac cath 2006..nonobstructive CAD 30-40s lesions.[name] 1/09 nondiagnostic due to poor HR response..Right Renal Cancer 2003   Iron  deficiency anemia    Long term (current) use of anticoagulants    On home oxygen  therapy    3L [place]   Osteoarthritis of right hip    PAD (peripheral artery disease) (HCC)    Pancreatic cancer (HCC)    sandostatin    PONV (postoperative nausea and vomiting)    Presence of permanent cardiac pacemaker    Pulmonary HTN (HCC)    RECTAL BLEEDING 10/13/2009   Qualifier: Diagnosis of  By: Kerman NP, Paula     Red blood cell antibody positive with compatible PRBC difficult  to obtain    Anti FYA (Duffy a) antibody. Must be transfused with PRB which are Duffy Antigen Negative and Crossmatch Compatible   Renal cell carcinoma (HCC) 2005   s/p right nephrectomy   Squamous cell carcinoma of skin 01/17/2015   R lat wrist   Squamous cell carcinoma of skin 01/29/2018   R post upper leg - superficially invasive   Squamous cell carcinoma of skin 03/25/2018   L lat foot   Squamous cell carcinoma of skin 11/09/2020   left lat foot - EDC 01/01/21, recurrent 01/31/21 - MOHs 02/22/21   Squamous  cell carcinoma of skin 12/19/2021   Right Posterior Medial Thigh, EDC   Squamous cell carcinoma of skin 12/19/2021   SCC IS, L lat heel, EDC 02/04/2022   Squamous cell carcinoma of skin 01/21/2022   L forearm, EDC 02/04/2022   Squamous cell carcinoma of skin 01/21/2022   R lower leg below knee, EDC 02/04/2022   Squamous cell carcinoma of skin 01/21/2022   SCCIS, R post heel, EDC 02/04/2022   Squamous cell carcinoma of skin 07/01/2022   left lower abdomen, in situ, EDC   Squamous cell carcinoma of skin 07/01/2022   left medial chest, EDC   Urge incontinence    Venous stasis of both lower extremities     Past Surgical History:  Procedure Laterality Date   ABDOMINAL AORTOGRAM W/LOWER EXTREMITY N/A 02/12/2021   Procedure: ABDOMINAL AORTOGRAM W/LOWER EXTREMITY;  Surgeon: Sheree Penne Bruckner, MD;  Location: [facility] INVASIVE CV LAB;  Service: Cardiovascular;  Laterality: N/A;   ABDOMINAL HYSTERECTOMY  1975   for benign causes   APPENDECTOMY     BI-VENTRICULAR PACEMAKER UPGRADE  05/04/2010   BIOPSY  02/28/2019   Procedure: BIOPSY;  Surgeon: Teressa Toribio SQUIBB, MD;  Location: [facility] ENDOSCOPY;  Service: Endoscopy;;   BIOPSY  03/04/2022   Procedure: BIOPSY;  Surgeon: Wilhelmenia Aloha Raddle., MD;  Location: [facility];  Service: Gastroenterology;;   CARDIAC CATHETERIZATION  2006   CARDIOVERSION N/A 02/20/2018   Procedure: CARDIOVERSION;  Surgeon: [name], MD;  Location: [facility] ORS;  Service: Cardiovascular;  Laterality: N/A;   CARDIOVERSION N/A 03/27/2018   Procedure: CARDIOVERSION;  Surgeon: [name], MD;  Location: [facility] ORS;  Service: Cardiovascular;  Laterality: N/A;   CATARACT EXTRACTION W/ INTRAOCULAR LENS  IMPLANT, BILATERAL  01/2006-02-2006   CHOLECYSTECTOMY N/A 11/11/2012   Procedure: LAPAROSCOPIC CHOLECYSTECTOMY WITH INTRAOPERATIVE CHOLANGIOGRAM;  Surgeon: Donnice POUR. Belinda, MD;  Location: [facility] OR;  Service: General;  Laterality: N/A;   COLONOSCOPY WITH PROPOFOL  N/A  02/28/2019   Procedure: COLONOSCOPY WITH PROPOFOL ;  Surgeon: Teressa Toribio SQUIBB, MD;  Location: [facility] ENDOSCOPY;  Service: Endoscopy;  Laterality: N/A;   ENTEROSCOPY N/A 03/30/2020   Procedure: ENTEROSCOPY;  Surgeon: San Sandor GAILS, DO;  Location: [facility] ENDOSCOPY;  Service: Gastroenterology;  Laterality: N/A;   ENTEROSCOPY N/A 02/10/2021   Procedure: ENTEROSCOPY;  Surgeon: Rollin Dover, MD;  Location: [facility] ENDOSCOPY;  Service: Endoscopy;  Laterality: N/A;   ENTEROSCOPY N/A 02/15/2022   Procedure: ENTEROSCOPY;  Surgeon: Federico Rosario BROCKS, MD;  Location: [facility] ENDOSCOPY;  Service: Gastroenterology;  Laterality: N/A;   ENTEROSCOPY N/A 11/06/2022   Procedure: ENTEROSCOPY;  Surgeon: Wilhelmenia Aloha Raddle., MD;  Location: [facility] ENDOSCOPY;  Service: Gastroenterology;  Laterality: N/A;   EP IMPLANTABLE DEVICE N/A 02/21/2016   Procedure: ICD Generator Changeout;  Surgeon: Elspeth BROCKS Sage, MD;  Location: [facility] INVASIVE CV LAB;  Service: Cardiovascular;  Laterality: N/A;   ESOPHAGOGASTRODUODENOSCOPY N/A 03/04/2022   Procedure: ESOPHAGOGASTRODUODENOSCOPY (EGD);  Surgeon: Wilhelmenia Aloha Raddle., MD;  Location: [facility];  Service: Gastroenterology;  Laterality: N/A;   ESOPHAGOGASTRODUODENOSCOPY (EGD) WITH PROPOFOL  N/A 02/28/2019   Procedure: ESOPHAGOGASTRODUODENOSCOPY (EGD) WITH PROPOFOL ;  Surgeon: Teressa Toribio SQUIBB, MD;  Location: [facility] ENDOSCOPY;  Service: Endoscopy;  Laterality: N/A;   EUS N/A 03/04/2022   Procedure: UPPER ENDOSCOPIC ULTRASOUND (EUS) RADIAL;  Surgeon: Wilhelmenia Aloha Raddle., MD;  Location: [facility] ENDOSCOPY;  Service: Gastroenterology;  Laterality: N/A;   FINE NEEDLE ASPIRATION N/A 03/04/2022   Procedure: FINE NEEDLE ASPIRATION (FNA) LINEAR;  Surgeon: Wilhelmenia Aloha Raddle., MD;  Location: [facility] ENDOSCOPY;  Service: Gastroenterology;  Laterality: N/A;   GIVENS CAPSULE STUDY N/A 03/15/2019   Procedure: GIVENS CAPSULE STUDY;  Surgeon: Eda Iha, MD;  Location: [facility] ENDOSCOPY;  Service: Gastroenterology;   Laterality: N/A;   HOT HEMOSTASIS N/A 03/30/2020   Procedure: HOT HEMOSTASIS (ARGON PLASMA COAGULATION/BICAP);  Surgeon: San Sandor GAILS, DO;  Location: [facility] ENDOSCOPY;  Service: Gastroenterology;  Laterality: N/A;   HOT HEMOSTASIS N/A 02/10/2021   Procedure: HOT HEMOSTASIS (ARGON PLASMA COAGULATION/BICAP);  Surgeon: Rollin Dover, MD;  Location: [facility] ENDOSCOPY;  Service: Endoscopy;  Laterality: N/A;   HOT HEMOSTASIS N/A 02/15/2022   Procedure: HOT HEMOSTASIS (ARGON PLASMA COAGULATION/BICAP);  Surgeon: Federico Rosario BROCKS, MD;  Location: [facility] ENDOSCOPY;  Service: Gastroenterology;  Laterality: N/A;   HOT HEMOSTASIS N/A 11/06/2022   Procedure: HOT HEMOSTASIS (ARGON PLASMA COAGULATION/BICAP);  Surgeon: Wilhelmenia Aloha Raddle., MD;  Location: [facility] ENDOSCOPY;  Service: Gastroenterology;  Laterality: N/A;   INSERT / REPLACE / REMOVE PACEMAKER  05/01/2011   02-28-05-/05-04-10-ICD-MEDTRONIC MAXIMAL DR   IR ANGIOGRAM SELECTIVE EACH ADDITIONAL VESSEL  02/18/2022   IR ANGIOGRAM SELECTIVE EACH ADDITIONAL VESSEL  02/18/2022   IR ANGIOGRAM VISCERAL SELECTIVE  02/16/2022   IR ANGIOGRAM VISCERAL SELECTIVE  02/16/2022   IR EMBO ART  VEN HEMORR LYMPH EXTRAV  INC GUIDE ROADMAPPING  02/16/2022   IR THORACENTESIS ASP PLEURAL SPACE W/IMG GUIDE  02/21/2022   IR THORACENTESIS ASP PLEURAL SPACE W/IMG GUIDE  02/22/2022   IR US  GUIDE VASC ACCESS RIGHT  02/16/2022   JOINT REPLACEMENT     LAPAROSCOPIC CHOLECYSTECTOMY  11/11/2012   LAPAROSCOPIC LYSIS OF ADHESIONS N/A 11/11/2012   Procedure: LAPAROSCOPIC LYSIS OF ADHESIONS;  Surgeon: Donnice POUR. Belinda, MD;  Location: [facility] OR;  Service: General;  Laterality: N/A;   NEPHRECTOMY Right 06/2001    S/P RENAL CELL CANCER   PERIPHERAL VASCULAR INTERVENTION Bilateral 02/12/2021   Procedure: PERIPHERAL VASCULAR INTERVENTION;  Surgeon: Sheree Penne Bruckner, MD;  Location: [facility] INVASIVE CV LAB;  Service: Cardiovascular;  Laterality: Bilateral;  Iliac artery stents   PRESSURE  SENSOR/CARDIOMEMS N/A 02/03/2019   Procedure: PRESSURE SENSOR/CARDIOMEMS;  Surgeon: [name], MD;  Location: [facility] INVASIVE CV LAB;  Service: Cardiovascular;  Laterality: N/A;   RIGHT HEART CATH N/A 11/09/2018   Procedure: RIGHT HEART CATH;  Surgeon: [name], MD;  Location: [facility] INVASIVE CV LAB;  Service: Cardiovascular;  Laterality: N/A;   RIGHT HEART CATH N/A 03/08/2019   Procedure: RIGHT HEART CATH;  Surgeon: [name], MD;  Location: [facility] INVASIVE CV LAB;  Service: Cardiovascular;  Laterality: N/A;   SUBMUCOSAL TATTOO INJECTION  02/28/2019   Procedure: SUBMUCOSAL TATTOO INJECTION;  Surgeon: Teressa Toribio SQUIBB, MD;  Location: [facility] ENDOSCOPY;  Service: Endoscopy;;   SUBMUCOSAL TATTOO INJECTION  11/06/2022   Procedure: SUBMUCOSAL TATTOO INJECTION;  Surgeon: Wilhelmenia Aloha Raddle., MD;  Location: [facility] ENDOSCOPY;  Service: Gastroenterology;;   TOTAL HIP ARTHROPLASTY Right 05/03/2011   Procedure: TOTAL HIP ARTHROPLASTY  ANTERIOR APPROACH;  Surgeon: Lonni CINDERELLA Poli;  Location: [facility] ORS;  Service: Orthopedics;  Laterality: Right;  Removal of Cannulated Screws Right Hip, Right Direct Anterior Hip Replacement   TOTAL HIP ARTHROPLASTY Left 10/09/2018   Procedure: TOTAL HIP ARTHROPLASTY ANTERIOR APPROACH;  Surgeon: Fidel Rogue, MD;  Location: [facility] OR;  Service: Orthopedics;  Laterality: Left;    Social History   Socioeconomic History   Marital status: Widowed    Spouse name: Not on file   Number of children: 2   Years of education: high school   Highest education level: Not on file  Occupational History   Occupation: Retired    Associate Professor: RETIRED  Tobacco Use   Smoking status: Former    Current packs/day: 0.00    Average packs/day: 0.5 packs/day for 40.0 years (20.0 ttl pk-yrs)    Types: Cigarettes    Start date: 05/06/1961    Quit date: 05/06/2001    Years since quitting: 22.7   Smokeless tobacco: Never  Vaping Use   Vaping status: Never Used  Substance and Sexual Activity    Alcohol use: No    Alcohol/week: 0.0 standard drinks of alcohol   Drug use: No   Sexual activity: Not Currently  Other Topics Concern   Not on file  Social History Narrative   Would desire CPR   07/11/20   From: the area   Living: with great-grandson - Norman (2003)   Work: retired - clerical      Family: 2 children - Devere and So-Hi - 2 grand chlidren - 5 great grand children      Enjoys: spending time with friends - watch move, eat out, spend time      Exercise: walking around the house   Diet: not good, limits red meat, limits salt, low appetite      Safety   Seat belts: Yes    Guns: Yes  and secure   Safe in relationships: Yes    Social Drivers of Corporate investment banker Strain: Low Risk  (11/06/2023)   Overall Financial Resource Strain (CARDIA)    Difficulty of Paying Living Expenses: Not hard at all  Food Insecurity: No Food Insecurity (01/09/2024)   Hunger Vital Sign    Worried About Running Out of Food in the Last Year: Never true    Ran Out of Food in the Last Year: Never true  Transportation Needs: No Transportation Needs (01/09/2024)   PRAPARE - Administrator, Civil Service (Medical): No    Lack of Transportation (Non-Medical): No  Physical Activity: Insufficiently Active (11/06/2023)   Exercise Vital Sign    Days of Exercise per Week: 3 days    Minutes of Exercise per Session: 20 min  Stress: No Stress Concern Present (11/06/2023)   [employer] of Occupational Health - Occupational Stress Questionnaire    Feeling of Stress: Not at all  Social Connections: Socially Isolated (01/09/2024)   Social Connection and Isolation Panel    Frequency of Communication with Friends and Family: More than three times a week    Frequency of Social Gatherings with Friends and Family: More than three times a week    Attends Religious Services: Never    Database administrator or Organizations: No    Attends Banker Meetings: Never    Marital Status:  Widowed  Intimate Partner Violence: Not At Risk (01/09/2024)   Humiliation, Afraid, Rape, and Kick questionnaire    Fear of Current or Ex-Partner: No  Emotionally Abused: No    Physically Abused: No    Sexually Abused: No    Family History  Problem Relation Age of Onset   Heart failure Mother    Early death Father        car accident   Breast cancer Maternal Aunt 71   Breast cancer Cousin    Breast cancer Other    Colon cancer Neg Hx    Esophageal cancer Neg Hx    Liver disease Neg Hx    Pancreatic cancer Neg Hx    Rectal cancer Neg Hx    Allergies  Allergen Reactions   Codeine Nausea And Vomiting and Other (See Comments)    Hallucinations, too- CANNOT HAVE; thinks she may have received Narcan  to reverse the effect   Dilaudid  [Hydromorphone ] Other (See Comments)    Excessive Somnolence- Required Narcan  X2   Tape Other (See Comments)    CAN TOLERATE ONLY EASY-RELEASE, PAPER TAPE, AS THE SKIN IS VERY THIN AND WILL BRUISE AND TEAR VERY EASILY   Amoxil  [Amoxicillin ] Nausea Only   Dorethia Koyanagi ] Other (See Comments)    Told to avoid due to reduced kidney function, has 1 remaining kidney   Ms Contin  [Morphine ] Nausea And Vomiting and Other (See Comments)    Hallucinations, also   Neurontin  [Gabapentin ] Other (See Comments)    Out of it   Nsaids Other (See Comments)    Told to avoid due to reduced kidney function, has 1 remaining kidney   I? Current Facility-Administered Medications  Medication Dose Route Frequency Provider Last Rate Last Admin   acetaminophen  (TYLENOL ) tablet 650 mg  650 mg Oral Q6H PRN Ponnala, Shruthi, MD   650 mg at 01/12/24 1842   ceFEPIme  (MAXIPIME ) 2 g in sodium chloride  0.9 % 100 mL IVPB  2 g Intravenous Q24H Elesa Perkins, RPH 200 mL/hr at 01/12/24 1823 2 g at 01/12/24 1823   enoxaparin  (LOVENOX ) injection 30 mg  30 mg Subcutaneous Q24H Garba, Mohammad L, MD   30 mg at 01/12/24 1821   feeding supplement (ENSURE PLUS HIGH PROTEIN) liquid 237 mL  237  mL Oral BID BM Garba, Mohammad L, MD   237 mL at 01/12/24 1052   leptospermum manuka honey (MEDIHONEY) paste 1 Application  1 Application Topical Daily Ponnala, Shruthi, MD       linezolid  (ZYVOX ) IVPB 600 mg  600 mg Intravenous Q12H Nicholaus Maize E, MD 300 mL/hr at 01/12/24 1057 600 mg at 01/12/24 1057   metolazone  (ZAROXOLYN ) tablet 2.5 mg  2.5 mg Oral Once per day on Monday Wednesday Friday Jerelene, Shruthi, MD   2.5 mg at 01/12/24 1053   ondansetron  (ZOFRAN ) tablet 4 mg  4 mg Oral Q6H PRN Garba, Mohammad L, MD   4 mg at 01/10/24 1837   Or   ondansetron  (ZOFRAN ) injection 4 mg  4 mg Intravenous Q6H PRN Garba, Mohammad L, MD       potassium chloride  SA (KLOR-CON  M) CR tablet 20 mEq  20 mEq Oral BID Ponnala, Shruthi, MD   20 mEq at 01/12/24 1052   prochlorperazine  (COMPAZINE ) injection 5 mg  5 mg Intravenous Q6H PRN Ponnala, Shruthi, MD       rosuvastatin  (CRESTOR ) tablet 10 mg  10 mg Oral Daily Ponnala, Shruthi, MD   10 mg at 01/12/24 1053   torsemide  (DEMADEX ) tablet 100 mg  100 mg Oral BID Ponnala, Shruthi, MD   100 mg at 01/12/24 1820   traMADol  (ULTRAM ) tablet 50 mg  50  mg Oral Q12H PRN Jerelene Critchley, MD   50 mg at 01/12/24 1328     Abtx:  Anti-infectives (From admission, onward)    Start     Dose/Rate Route Frequency Ordered Stop   01/09/24 2200  linezolid  (ZYVOX ) IVPB 600 mg        600 mg 300 mL/hr over 60 Minutes Intravenous Every 12 hours 01/09/24 1759     01/09/24 1930  ceFEPIme  (MAXIPIME ) 2 g in sodium chloride  0.9 % 100 mL IVPB        2 g 200 mL/hr over 30 Minutes Intravenous Every 24 hours 01/09/24 1915         REVIEW OF SYSTEMS:  Const: negative fever, negative chills, negative weight loss Eyes: negative diplopia or visual changes, negative eye pain ENT: negative coryza, negative sore throat Resp: negative cough, hemoptysis, dyspnea Cards: negative for chest pain, palpitations, lower extremity edema GU: negative for frequency, dysuria and hematuria GI: Negative  for abdominal pain, diarrhea, bleeding, constipation Skin: as above Heme: negative for easy bruising and gum/nose bleeding FD:dzczmz pain Neurolo:negative for headaches, dizziness, vertigo, memory problems  Psych: negative for feelings of anxiety, depression  Endocrine: negative for thyroid , diabetes Allergy/Immunology- multiple allergies ?  Objective:  VITALS:  BP (!) 135/55 (BP Location: Right Arm)   Pulse 62   Temp (!) 97.5 F (36.4 C)   Resp 17   Ht 5' 6 (1.676 m)   Wt 57 kg   SpO2 100%   BMI 20.29 kg/m   PHYSICAL EXAM:  General: Alert, cooperative, no distress, appears stated age.  Head: Normocephalic, without obvious abnormality, atraumatic. Eyes: Conjunctivae clear, anicteric sclerae. Pupils are equal ENT Nares normal. No drainage or sinus tenderness. Lips, mucosa, and tongue normal. No Thrush Neck: Supple, symmetrical, no adenopathy, thyroid : non tender no carotid bruit and no JVD. Back: No CVA tenderness. Lungs: b/l air entry Heart: s1s2 Abdomen: Soft, non-tender,not distended. Bowel sounds normal. No masses Extremities: left leg- foot and lateral shin area- verrucous ulcerated dark pigmented ulcer Rt foot         Skin: as above Lymph: Cervical, supraclavicular normal. Neurologic: Grossly non-focal Pertinent Labs Lab Results CBC    Component Value Date/Time   WBC 4.2 01/11/2024 0438   RBC 3.79 (L) 01/11/2024 0438   HGB 9.1 (L) 01/11/2024 0438   HGB 9.9 (L) 01/07/2024 1355   HGB 8.1 (L) 06/09/2023 1456   HCT 31.0 (L) 01/11/2024 0438   HCT 28.9 (L) 06/09/2023 1456   PLT 176 01/11/2024 0438   PLT 217 01/07/2024 1355   PLT 256 06/09/2023 1456   MCV 81.8 01/11/2024 0438   MCV 84 06/09/2023 1456   MCH 24.0 (L) 01/11/2024 0438   MCHC 29.4 (L) 01/11/2024 0438   RDW 20.4 (H) 01/11/2024 0438   RDW 19.5 (H) 06/09/2023 1456   LYMPHSABS 0.6 (L) 01/09/2024 1420   LYMPHSABS 0.6 (L) 03/22/2019 1415   MONOABS 0.6 01/09/2024 1420   EOSABS 0.0 01/09/2024  1420   EOSABS 0.2 03/22/2019 1415   BASOSABS 0.0 01/09/2024 1420   BASOSABS 0.0 03/22/2019 1415       Latest Ref Rng & Units 01/12/2024    5:04 AM 01/11/2024    4:38 AM 01/10/2024    7:12 AM  CMP  Glucose 70 - 99 mg/dL 95  95  854   BUN 8 - 23 mg/dL 47  53  60   Creatinine 0.44 - 1.00 mg/dL 8.17  8.33  8.21   Sodium 135 -  145 mmol/L 135  134  137   Potassium 3.5 - 5.1 mmol/L 4.4  3.7  3.4   Chloride 98 - 111 mmol/L 101  98  102   CO2 22 - 32 mmol/L 23  27  27    Calcium  8.9 - 10.3 mg/dL 8.5  8.4  8.4   Total Protein 6.5 - 8.1 g/dL   6.5   Total Bilirubin 0.0 - 1.2 mg/dL   0.6   Alkaline Phos 38 - 126 U/L   53   AST 15 - 41 U/L   27   ALT 0 - 44 U/L   12       Microbiology: Recent Results (from the past 240 hours)  Blood culture (routine x 2)     Status: None (Preliminary result)   Collection Time: 01/09/24  6:34 PM   Specimen: BLOOD  Result Value Ref Range Status   Specimen Description BLOOD LEFT ANTECUBITAL  Final   Special Requests   Final    BOTTLES DRAWN AEROBIC AND ANAEROBIC Blood Culture results may not be optimal due to an inadequate volume of blood received in culture bottles   Culture   Final    NO GROWTH 3 DAYS Performed at [facility], [address], [place], [place] [address]    Report Status PENDING  Incomplete  Blood culture (routine x 2)     Status: None (Preliminary result)   Collection Time: 01/09/24  9:56 PM   Specimen: BLOOD  Result Value Ref Range Status   Specimen Description BLOOD BLOOD RIGHT ARM  Final   Special Requests   Final    BOTTLES DRAWN AEROBIC AND ANAEROBIC Blood Culture results may not be optimal due to an inadequate volume of blood received in culture bottles   Culture   Final    NO GROWTH 3 DAYS Performed at [facility], [address] [place], [place] [address]    Report Status PENDING  Incomplete      IMAGING RESULTS: Xray left foot- no bone involvement I have personally reviewed the  films ? Impression/Recommendation Chronic ulcers both legs- non healing not changed in 1 year No surrounding erythema- tender Dark pigmentation, verrucous D.D  SCC Venous ulcers PAD Infection   Now Secondarily infected VS colonized with MRSA Currently on linezolid  and cefepime - Dc cefepime - continue linezolid  for 7 days  These ulcers need to be biopsied to r/o SCC but because of her being on Bevacizumab  cannot be biopsied So this is a difficult situation  As it has been stable in the past year just treat symptomatically and topically   Pancreatic neuroendocrine tumor- on sandostatin   Followed by Dr.Feng  Chronic 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 CHF-Has AICD  H/o RCC s/p rt nephrectomy  CKD  This consult involves complex antimicrobial management  I have personally spent  75---minutes involved in face-to-face and non-face-to-face activities for this patient on the day of the visit. Professional time spent includes the following activities: Preparing to see the patient (review of tests), Obtaining and/or reviewing separately obtained history (admission/discharge record), Performing a medically appropriate examination and/or evaluation , Ordering medications/tests/procedures, referring and communicating with other health care professionals, Documenting clinical information in the EMR, Independently interpreting results (not separately reported), Communicating results to the patient/family, Counseling and educating the patient/family/and Care coordination (not separately reported).    ________________________________________________ Discussed with patient, requesting provider  Note:  This document was prepared using Dragon  voice recognition software and may include unintentional dictation errors.

## 2024-01-12 NOTE — Progress Notes (Signed)
 Progress Note    01/12/2024 11:27 AM * No surgery found *  Subjective:  [name] is an 89 yo female who presented to [facility] emergency room for bilateral lower extremity cellulitis with open ulcerations. For the past 2 years she has had bilateral lower leg hyperkeratosis. No instigating factors and normal legs prior. Patient with past medical history significant for pancreatic neuroendocrine tumor on chemotherapy, peripheral vascular disease, heart failure with preserved ejection fraction, chronic kidney failure stage III, history of nephrectomy, pulmonary hypertension, chronic stasis dermatitis, essential hypertension, COPD, atrial fibrillation, squamous cell carcinoma of the skin, hypertrophic cardiomyopathy, hyperlipidemia, normocytic anemia, on chronic oxygen  at home, chronic cellulitis of lower extremity and left lower extremity wound, follows at wound care center and Dermatologist. Patient has 2 lower extremity ulcers, poor healing. Has been on antibiotics, cultures growing MRSA and Corynebacterium, was sent to the hospital for IV antibiotics.   On exam this morning the patient patient endorses her left lower extremity is painful to touch.  She is adamant about not having any treatment for her legs.  Patient states that she was not admitted to the hospital and did not come to the hospital because of her legs.  Patient endorses she is on chemotherapy that she does not want to stop and was told that her a Avastin  for pancreatic cancer would have to be stopped for any kind of procedures.  Patient's nurse was at the bedside this morning with me.  Patient is refusing the wound care nurses recommendations for cleaning and treatment.  She is also refusing any bilateral lower extremity wraps.  I recommend that the patient undergo an MRI or CT scan due to the fact that she has a pacemaker to evaluate for osteomyelitis.  Patient has even refused any kind of testing.  Vascular surgery was consulted to  evaluate   Vitals:   01/12/24 0408 01/12/24 0828  BP: (!) 111/56 130/66  Pulse: 60 (!) 59  Resp: 20 16  Temp: 98.4 F (36.9 C) 97.9 F (36.6 C)  SpO2: 100% 100%   Physical Exam: Cardiac:  Irregular Rhythm Hx of Afib. N murmur appreciated.  Lungs: Clear on auscultation throughout, normal labored breathing. Incisions: None Extremities: Bilateral lower extremities with hyperkeratosis.  +2 edema with two 1 cm ulcerations to the patient's left lower extremity with gangrene.  Weeping noted to the bed sheets.  Abdomen: Positive bowel sounds throughout, soft, nontender nondistended. Neurologic: Alert and oriented x 3, answers all questions and follows commands.  Patient does not appear competent enough to make educated decisions regarding her care.  CBC    Component Value Date/Time   WBC 4.2 01/11/2024 0438   RBC 3.79 (L) 01/11/2024 0438   HGB 9.1 (L) 01/11/2024 0438   HGB 9.9 (L) 01/07/2024 1355   HGB 8.1 (L) 06/09/2023 1456   HCT 31.0 (L) 01/11/2024 0438   HCT 28.9 (L) 06/09/2023 1456   PLT 176 01/11/2024 0438   PLT 217 01/07/2024 1355   PLT 256 06/09/2023 1456   MCV 81.8 01/11/2024 0438   MCV 84 06/09/2023 1456   MCH 24.0 (L) 01/11/2024 0438   MCHC 29.4 (L) 01/11/2024 0438   RDW 20.4 (H) 01/11/2024 0438   RDW 19.5 (H) 06/09/2023 1456   LYMPHSABS 0.6 (L) 01/09/2024 1420   LYMPHSABS 0.6 (L) 03/22/2019 1415   MONOABS 0.6 01/09/2024 1420   EOSABS 0.0 01/09/2024 1420   EOSABS 0.2 03/22/2019 1415   BASOSABS 0.0 01/09/2024 1420   BASOSABS 0.0 03/22/2019 1415  BMET    Component Value Date/Time   NA 135 01/12/2024 0504   NA 139 11/26/2023 1459   K 4.4 01/12/2024 0504   CL 101 01/12/2024 0504   CO2 23 01/12/2024 0504   GLUCOSE 95 01/12/2024 0504   BUN 47 (H) 01/12/2024 0504   BUN 56 (H) 11/26/2023 1459   CREATININE 1.82 (H) 01/12/2024 0504   CREATININE 2.39 (H) 10/01/2023 1505   CREATININE 2.12 (H) 06/23/2023 1518   CALCIUM  8.5 (L) 01/12/2024 0504   GFRNONAA 27 (L)  01/12/2024 0504   GFRNONAA 19 (L) 10/01/2023 1505   GFRAA 39 (L) 12/23/2019 1458    INR    Component Value Date/Time   INR 1.2 09/03/2023 1600   INR 2.2 01/19/2010 1536     Intake/Output Summary (Last 24 hours) at 01/12/2024 1127 Last data filed at 01/12/2024 0900 Gross per 24 hour  Intake 1580 ml  Output 800 ml  Net 780 ml     Assessment/Plan:  89 y.o. female who presents to [facility] emergency department with worsening pain and erythema from her left lower extremity sent in by dermatology for IV antibiotics.  Patient's wounds were swabbed by her dermatologist and they have both grown out MRSA and Cornia bacterium.* No surgery found *   PLAN Patient is refusing and intervention, wound care or dressings. I recommend an MRI to evaluate for Osteomyelitis if patients pacemaker is compatible. If not a CT scan of her lower extremities without contrast is acceptable. Will evaluate once completed.    DVT prophylaxis:  Lovenox  30 mg SQ Q24   [name] R Tyresse [facility] 01/12/2024 11:27 AM

## 2024-01-12 NOTE — Progress Notes (Signed)
 PROGRESS NOTE    [name]  FMW:[id number] DOB: 03-22-1934 DOA: 01/09/2024 PCP: [name], MD  Chief Complaint  Patient presents with   Wound Infection    Hospital Course:  [name] is a 89 y.o. 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 Patient has 2 lower extremity ulcers, poor healing.  Has been on antibiotics, cultures growing MRSA and Corynebacterium, was sent to the hospital for IV antibiotics.    Subjective: Patient was examined at the bedside, states she feels tired.  Pain controlled with tramadol  50 mg.  Patient refused honeycomb dressing, would like to get Aquacel and Mepilex on top.  WOC consulted  Discussed with vascular surgery, able to do MRI has pacemaker incompatible, will do CT without contrast to look for possible osteomyelitis   Objective: Vitals:   01/11/24 0834 01/11/24 1520 01/11/24 1925 01/12/24 0408  BP: (!) 112/56 121/66 (!) 129/58 (!) 111/56  Pulse: (!) 59 (!) 59 (!) 59 60  Resp: 17 19  20   Temp: 98.3 F (36.8 C) 97.8 F (36.6 C) 98.2 F (36.8 C) 98.4 F (36.9 C)  TempSrc: Oral Oral Oral   SpO2: 99% 100% 100% 100%  Weight:      Height:        Intake/Output Summary (Last 24 hours) at 01/12/2024 9178 Last data filed at 01/11/2024 2300 Gross per 24 hour  Intake 1580 ml  Output 800 ml  Net 780 ml   Filed Weights   01/09/24 2145  Weight: 57 kg    Examination: Constitutional: Chronically ill looking, NAD,  calm, comfortable Neck: normal, supple, no masses, no thyromegaly Respiratory: clear to auscultation bilaterally,  Normal respiratory effort. No accessory muscle use.  Cardiovascular: Regular rate and rhythm, no murmurs / rubs / gallops. No extremity edema. 2+ pedal pulses. No carotid bruits.  Abdomen: no tenderness, no masses palpated. No hepatosplenomegaly. Bowel sounds positive.  Musculoskeletal: Good range of motion, no joint swelling or tenderness, Skin: Bilateral lower extremity chronic stasis dermatitis with 2 open wounds which are deep about 5 x 6 cm in diameter with crusted base Neurologic: CN 2-12 grossly intact. Sensation intact, DTR normal. Strength 5/5 in all 4  Assessment & Plan:  Principal Problem:   Leg wound, left, initial encounter Active Problems:   Cellulitis of left lower extremity   Acute on chronic diastolic CHF (congestive heart failure) (HCC)   Normocytic anemia   Primary pancreatic neuroendocrine tumor   Hyperlipidemia   HOCM/IHSS   Atrial fibrillation, permanent (HCC)   COPD (chronic obstructive pulmonary disease) (HCC)   Acute on chronic kidney failure (HCC)   ICD (implantable cardioverter-defibrillator) in place   Hypertension   PAD (peripheral artery disease) (HCC)   History of nephrectomy secondary to RCC   Pulmonary hypertension (HCC)  Infected left lower extremity wound with surrounding cellulitis - Patient has outpatient culture apparently growing MRSA and corynebacterium - XR left tibia/fibula large soft tissue defect within the distal left lower leg unchanged, persistent diffuse subcutaneous edema.  No acute/destructive bony abnormalities - US  negative for DVT bilaterally, US  ABI right ABI 0.78, left could not be evaluated -  On IV Zyvox , cefepime  - Follow-up blood cultures - Seen by WOC, recommend debridement - Seen by vascular surgery, appreciate recs, intraoperative debridement would be appropriate, unclear etiology.  Pacemaker incompatible for  MRI, will do CT LLE to rule out osteomyelitis - Discussed with oncology, will not be able to do debridement for 4 to 6 weeks as patient has been on Avastin .  If there is plan for intervention, we will have to hold Avastin  and anemia will get worse - Seen by orthopedics, appreciate recs.  No signs of bone involvement - ID consulted, appreciate recs - Pain management with Tylenol , tramadol  (inc 50 q12)  Hypokalemia - resolved -magnesium  normal - Resume home supplements  AKI on CKD 3b/IV - Cr improved s/p IV fluids, Baseline Cr 1.7-1.9 - Monitor Cr  Normocytic anemia - Monitor Hb   Chronic diastolic heart failure Hypertrophic cardiomyopathy Pulmonary HTN - Continue home Torsemide  and Metolazone   COPD with chronic hypoxic respiratory failure On home oxygen  2L [place] Continue home inhalers   Hx pancreatic neuroendocrine cancer Patient is on chemotherapy.  Continue follow-up with oncologist outpatient   Hyperlipidemia: Continue with statin   CAD: Status post ICD placement.  Continue to monitor   Peripheral vascular disease: Will continue home regimen.   DVT prophylaxis: Lovenox  SQ   Code Status: Limited: Do not attempt resuscitation (DNR) -DNR-LIMITED -Do Not Intubate/DNI  Disposition:  TBD  Consultants:  Orthopedic surgery Infectious disease Vascular surgery  Procedures:  None  Antimicrobials:  Anti-infectives (From admission, onward)    Start     Dose/Rate Route Frequency Ordered Stop   01/09/24 2200  linezolid  (ZYVOX ) IVPB 600 mg        600 mg 300 mL/hr over 60 Minutes Intravenous Every 12 hours 01/09/24 1759     01/09/24 1930  ceFEPIme  (MAXIPIME ) 2 g in sodium chloride  0.9 % 100 mL IVPB        2 g 200 mL/hr over 30 Minutes Intravenous Every 24 hours 01/09/24 1915         Data Reviewed: I have personally reviewed following labs and imaging studies CBC: Recent Labs  Lab 01/07/24 1355 01/09/24 1420 01/09/24 1947 01/10/24 0712 01/11/24 0438  WBC 5.2 5.0  6.4 3.7* 4.2  NEUTROABS 3.9 3.8  --   --   --   HGB 9.9* 9.5* 9.2* 9.1* 9.1*  HCT 32.6* 32.0* 30.8* 30.1* 31.0*  MCV 78.2* 80.8 81.3 79.8* 81.8  PLT 217 208 196 186 176   Basic Metabolic Panel: Recent Labs  Lab 01/09/24 1420 01/09/24 1947 01/10/24 0712 01/11/24 0438 01/12/24 0504  NA 137  --  137 134* 135  K 3.4*  --  3.4* 3.7 4.4  CL 94*  --  102 98 101  CO2 28  --  27 27 23   GLUCOSE 144*  --  145* 95 95  BUN 65*  --  60* 53* 47*  CREATININE 1.83* 2.22* 1.78* 1.66* 1.82*  CALCIUM  9.3  --  8.4* 8.4* 8.5*  MG  --   --  2.1  --  2.1   GFR: Estimated Creatinine Clearance: 19.6 mL/min (A) (by C-G formula based on SCr of 1.82 mg/dL (H)). Liver Function Tests: Recent Labs  Lab 01/09/24 1835 01/10/24 0712  AST 21 27  ALT 8 12  ALKPHOS 56 53  BILITOT 0.8 0.6  PROT 8.3* 6.5  ALBUMIN  4.0 3.1*   CBG: No results for input(s): GLUCAP in the last 168 hours.  Recent Results (from the past 240 hours)  Blood culture (routine x 2)     Status: None (Preliminary result)   Collection Time: 01/09/24  6:34 PM   Specimen: BLOOD  Result Value Ref Range Status   Specimen Description BLOOD LEFT ANTECUBITAL  Final   Special Requests   Final    BOTTLES DRAWN AEROBIC AND ANAEROBIC Blood Culture results may not be optimal due to an inadequate volume of blood received in culture bottles   Culture   Final    NO GROWTH 3 DAYS Performed at [facility], [address], [place], [place] [address]    Report Status PENDING  Incomplete  Blood culture (routine x 2)     Status: None (Preliminary result)   Collection Time: 01/09/24  9:56 PM   Specimen: BLOOD  Result Value Ref Range Status   Specimen Description BLOOD BLOOD RIGHT ARM  Final   Special Requests   Final    BOTTLES DRAWN AEROBIC AND ANAEROBIC Blood Culture results may not be optimal due to an inadequate volume of blood received in culture bottles   Culture   Final    NO GROWTH 3 DAYS Performed at [facility],  [address], [place], [place] [address]    Report Status PENDING  Incomplete     Radiology Studies: US  ARTERIAL ABI (SCREENING LOWER EXTREMITY) Result Date: 01/11/2024 CLINICAL DATA:  Peripheral arterial disease. Wounds on both legs. Left leg could not be evaluated due to wounds and patient pain. EXAM: NONINVASIVE PHYSIOLOGIC VASCULAR STUDY OF BILATERAL LOWER EXTREMITIES TECHNIQUE: Evaluation of both lower extremities were performed at rest, including calculation of ankle-brachial indices with single level Doppler, pressure and pulse volume recording. COMPARISON:  None Available. FINDINGS: Right ABI:  0.78 Left ABI:  Not evaluated Right Lower Extremity:  Irregular waveforms at the right ankle. Left Lower Extremity:  Not evaluated 0.5-0.79 Moderate PAD IMPRESSION: 1. Right ankle-brachial index is 0.78 suggesting moderate peripheral arterial disease. 2. Left leg could not be evaluated. Consider further evaluation with CTA or MRA if needed. Electronically Signed   By: Juliene Balder M.D.   On: 01/11/2024 08:11    Scheduled Meds:  enoxaparin  (LOVENOX ) injection  30 mg Subcutaneous Q24H   feeding supplement  237 mL Oral BID BM   leptospermum manuka honey  1 Application Topical Daily   metolazone   2.5 mg Oral Once per day on Monday Wednesday Friday   potassium chloride  SA  20 mEq Oral BID   rosuvastatin   10 mg Oral Daily   torsemide   100 mg Oral BID   Continuous Infusions:  ceFEPime  (MAXIPIME ) IV 2 g (01/11/24 1831)   linezolid  (ZYVOX ) IV 600 mg (01/11/24 2209)     LOS: 3 days  MDM: Patient is high risk for one or more organ failure.  They necessitate ongoing hospitalization for continued IV therapies and subsequent lab monitoring. Total time spent interpreting labs and vitals, reviewing the medical record, coordinating care amongst consultants and care team members, directly assessing and discussing care with the patient and/or family: 55 min  Laree Lock, MD [facility]  To contact  the attending physician between 7A-7P please use Epic Chat. To contact the covering physician during after hours 7P-7A, please review Amion.  01/12/2024, 8:21 AM   *This document has been created with the assistance of dictation software. Please excuse typographical errors. *

## 2024-01-12 NOTE — Care Management Important Message (Signed)
 Important Message  Patient Details  Name: [name] MRN: [id number] Date of Birth: 1933-07-07   Important Message Given:  Yes - Medicare IM     Rishabh Rinkenberger W, CMA 01/12/2024, 11:59 AM

## 2024-01-13 DIAGNOSIS — I872 Venous insufficiency (chronic) (peripheral): Secondary | ICD-10-CM | POA: Diagnosis not present

## 2024-01-13 DIAGNOSIS — L97919 Non-pressure chronic ulcer of unspecified part of right lower leg with unspecified severity: Secondary | ICD-10-CM | POA: Diagnosis not present

## 2024-01-13 DIAGNOSIS — S81802A Unspecified open wound, left lower leg, initial encounter: Secondary | ICD-10-CM | POA: Diagnosis not present

## 2024-01-13 DIAGNOSIS — L819 Disorder of pigmentation, unspecified: Secondary | ICD-10-CM | POA: Diagnosis not present

## 2024-01-13 DIAGNOSIS — L03116 Cellulitis of left lower limb: Secondary | ICD-10-CM | POA: Diagnosis not present

## 2024-01-13 DIAGNOSIS — L97929 Non-pressure chronic ulcer of unspecified part of left lower leg with unspecified severity: Secondary | ICD-10-CM | POA: Diagnosis not present

## 2024-01-13 DIAGNOSIS — Z22322 Carrier or suspected carrier of Methicillin resistant Staphylococcus aureus: Secondary | ICD-10-CM | POA: Diagnosis not present

## 2024-01-13 MED ORDER — LINEZOLID 600 MG PO TABS
600.0000 mg | ORAL_TABLET | Freq: Two times a day (BID) | ORAL | Status: AC
Start: 2024-01-13 — End: 2024-01-19
  Administered 2024-01-13 – 2024-01-19 (×13): 600 mg via ORAL
  Filled 2024-01-13 (×13): qty 1

## 2024-01-13 MED ORDER — ACETAMINOPHEN 500 MG PO TABS
1000.0000 mg | ORAL_TABLET | Freq: Three times a day (TID) | ORAL | Status: DC | PRN
  Administered 2024-01-13 – 2024-01-26 (×14): 1000 mg via ORAL
  Filled 2024-01-13 (×14): qty 2

## 2024-01-13 MED ORDER — LINEZOLID 600 MG/300ML IV SOLN
600.0000 mg | Freq: Two times a day (BID) | INTRAVENOUS | Status: DC
Start: 2024-01-13 — End: 2024-01-13
  Filled 2024-01-13: qty 300

## 2024-01-13 MED ORDER — TRAMADOL HCL 50 MG PO TABS
50.0000 mg | ORAL_TABLET | Freq: Four times a day (QID) | ORAL | Status: DC | PRN
  Administered 2024-01-13 – 2024-01-26 (×31): 50 mg via ORAL
  Filled 2024-01-13 (×32): qty 1

## 2024-01-13 NOTE — Progress Notes (Signed)
 Date of Admission:  01/09/2024              ID: [name] is a 89 y.o. female  Principal Problem:   Leg wound, left, initial encounter Active Problems:   Hyperlipidemia   HOCM/IHSS   Atrial fibrillation, permanent (HCC)   COPD (chronic obstructive pulmonary disease) (HCC)   Acute on chronic kidney failure (HCC)   ICD (implantable cardioverter-defibrillator) in place   Hypertension   PAD (peripheral artery disease) (HCC)   History of nephrectomy secondary to RCC   Pulmonary hypertension (HCC)   Acute on chronic diastolic CHF (congestive heart failure) (HCC)   Normocytic anemia   Primary pancreatic neuroendocrine tumor   Cellulitis of left lower extremity   MRSA (methicillin resistant staph aureus) culture positive    Subjective: Patient is stable She says her pain in her legs are better   Medications:   enoxaparin  (LOVENOX ) injection  30 mg Subcutaneous Q24H   feeding supplement  237 mL Oral BID BM   leptospermum manuka honey  1 Application Topical Daily   linezolid   600 mg Oral Q12H   metolazone   2.5 mg Oral Once per day on Monday Wednesday Friday   potassium chloride  SA  20 mEq Oral BID   rosuvastatin   10 mg Oral Daily   torsemide   100 mg Oral BID    Objective: Vital signs in last 24 hours: Patient Vitals for the past 24 hrs:  BP Temp Temp src Pulse Resp SpO2  01/13/24 1508 (!) 147/84 -- -- -- -- --  01/13/24 1446 (!) 89/72 97.7 F (36.5 C) Oral 65 18 99 %  01/13/24 0900 133/71 97.6 F (36.4 C) Oral (!) 59 18 100 %  01/13/24 0333 135/63 98 F (36.7 C) Oral 60 18 100 %  01/12/24 2027 (!) 127/45 99.3 F (37.4 C) Oral (!) 59 18 100 %     PHYSICAL EXAM:  General: Alert, cooperative, no distress, appears stated age.  Lungs: Clear to auscultation bilaterally. No Wheezing or Rhonchi. No rales. Heart: Regular rate and rhythm, no murmur, rub or gallop. Abdomen: Soft, non-tender,not distended. Bowel sounds normal. No masses Extremities venous  pigmentation both legs Area multiple areas of hyperkeratotic ulceration on both legs and feet It is blackish in color  Lymph: Cervical, supraclavicular normal. Neurologic: Grossly non-focal  Lab Results    Latest Ref Rng & Units 01/11/2024    4:38 AM 01/10/2024    7:12 AM 01/09/2024    7:47 PM  CBC  WBC 4.0 - 10.5 K/uL 4.2  3.7  6.4   Hemoglobin 12.0 - 15.0 g/dL 9.1  9.1  9.2   Hematocrit 36.0 - 46.0 % 31.0  30.1  30.8   Platelets 150 - 400 K/uL 176  186  196        Latest Ref Rng & Units 01/12/2024    5:04 AM 01/11/2024    4:38 AM 01/10/2024    7:12 AM  CMP  Glucose 70 - 99 mg/dL 95  95  854   BUN 8 - 23 mg/dL 47  53  60   Creatinine 0.44 - 1.00 mg/dL 8.17  8.33  8.21   Sodium 135 - 145 mmol/L 135  134  137   Potassium 3.5 - 5.1 mmol/L 4.4  3.7  3.4   Chloride 98 - 111 mmol/L 101  98  102   CO2 22 - 32 mmol/L 23  27  27    Calcium  8.9 - 10.3 mg/dL 8.5  8.4  8.4   Total Protein 6.5 - 8.1 g/dL   6.5   Total Bilirubin 0.0 - 1.2 mg/dL   0.6   Alkaline Phos 38 - 126 U/L   53   AST 15 - 41 U/L   27   ALT 0 - 44 U/L   12       Microbiology:  Studies/Results: CT FOOT LEFT WO CONTRAST Result Date: 01/13/2024 CLINICAL DATA:  Osteomyelitis EXAM: CT OF THE LEFT FOOT WITHOUT CONTRAST TECHNIQUE: Multidetector CT imaging of the left foot was performed according to the standard protocol. Multiplanar CT image reconstructions were also generated. RADIATION DOSE REDUCTION: This exam was performed according to the departmental dose-optimization program which includes automated exposure control, adjustment of the mA and/or kV according to patient size and/or use of iterative reconstruction technique. COMPARISON:  01/01/2024 radiographs FINDINGS: Bones/Joint/Cartilage Bony demineralization. No bony destructive findings characteristic of osteomyelitis. No fracture identified. Small plantar and Achilles calcaneal spurs. Ligaments Suboptimally assessed by CT. Muscles and Tendons Unremarkable Soft tissues  Cutaneous thickening in lobularity along the distal calf, cannot exclude blistering. This extends into the ankle and especially into the dorsum of the foot, with potential cutaneous ulceration dorsally overlying the cuboid on image 48 series 10. Confluent subcutaneous edema just plantar to the proximal plantar fascia, potentially from mild adventitial bursitis. IMPRESSION: 1. No bony destructive findings characteristic of osteomyelitis. 2. Cutaneous thickening and lobularity along the distal calf, ankle, and dorsum of the foot, with potential cutaneous ulceration dorsally overlying the cuboid. 3. Confluent subcutaneous edema just plantar to the proximal plantar fascia, potentially from mild adventitial bursitis. 4. Bony demineralization. 5. Small plantar and Achilles calcaneal spurs. Electronically Signed   By: Ryan Salvage M.D.   On: 01/13/2024 11:21   CT TIBIA FIBULA LEFT WO CONTRAST Result Date: 01/13/2024 CLINICAL DATA:  Osteomyelitis EXAM: CT OF THE LOWER LEFT EXTREMITY WITHOUT CONTRAST TECHNIQUE: Multidetector CT imaging of the lower left extremity was performed according to the standard protocol. RADIATION DOSE REDUCTION: This exam was performed according to the departmental dose-optimization program which includes automated exposure control, adjustment of the mA and/or kV according to patient size and/or use of iterative reconstruction technique. COMPARISON:  Radiographs 01/09/2024 FINDINGS: Bones/Joint/Cartilage Bony demineralization. Mild chronic periostitis in the tibia and fibula, probably a manifestation of venous insufficiency. No bony destructive findings characteristic of osteomyelitis. Ligaments Suboptimally assessed by CT. Muscles and Tendons Subtle loss of fat planes along the distal tibialis anterior and extensor digitorum longus myotendinous junctions for example on image 161 series 8, potentially reflecting low-grade myositis underlying the large volar ulceration. No drainable abscess  observed. Soft tissues Cutaneous irregularity especially in the lower calf and especially anteriorly and tracking into the ankle. Cutaneous and subcutaneous ulceration at the distal metadiaphyseal level anteriorly on image 158 series 6. Cannot exclude cutaneous blistering along the lower calf. No appreciable drainable abscess. IMPRESSION: 1. Cutaneous and subcutaneous ulceration at the distal metadiaphyseal level anteriorly. Cannot exclude cutaneous blistering along the lower calf. No appreciable drainable abscess. 2. Subtle loss of fat planes along the distal tibialis anterior and extensor digitorum longus myotendinous junctions, potentially reflecting low-grade myositis underlying the large volar ulceration. 3. No bony destructive findings characteristic of osteomyelitis. 4. Mild chronic periostitis in the tibia and fibula, probably a manifestation of venous insufficiency. 5. Bony demineralization. Electronically Signed   By: Ryan Salvage M.D.   On: 01/13/2024 11:15     Assessment/Plan: Chronic ulcers of both legs Has been nonhealing and has not changed  much in a year The differential diagnosis is venous ulceration, PAD, squamous cell carcinoma and also the the medication she takes bevacizumab  has also kept it from not healing.  It is now secondarily infected with MRSA The hyperkeratotic nature of these ulcers could be de due to her not washing her legs every day with accumulation of scaly skin.  Because of the hyperkeratotic nature of the ulcers we cannot assess the true wound underneath.  Doubt this is  [place] scabies. Unless the scales are all removed and then a biopsy is done we will not have a etiology for these chronic ulcers.  She is stable clinically. Patient is on linezolid  which can be switched to oral route. She will need another week of linezolid .  While on linezolid  need to get a CBC checked in the next week   Pancreatic neuroendocrine tumor on Sandostatin  followed by Dr.  Lanny. 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  Call if needed.

## 2024-01-13 NOTE — Progress Notes (Signed)
 Progress Note    01/13/2024 4:38 PM * No surgery found *  Subjective:  [name] is an 89 yo female who presented to [facility] emergency room for bilateral lower extremity cellulitis with open ulcerations. For the past 2 years she has had bilateral lower leg hyperkeratosis. No instigating factors and normal legs prior. Patient with past medical history significant for pancreatic neuroendocrine tumor on chemotherapy, peripheral vascular disease, heart failure with preserved ejection fraction, chronic kidney failure stage III, history of nephrectomy, pulmonary hypertension, chronic stasis dermatitis, essential hypertension, COPD, atrial fibrillation, squamous cell carcinoma of the skin, hypertrophic cardiomyopathy, hyperlipidemia, normocytic anemia, on chronic oxygen  at home, chronic cellulitis of lower extremity and left lower extremity wound, follows at wound care center and Dermatologist. Patient has 2 lower extremity ulcers, poor healing. Has been on antibiotics, cultures growing MRSA and Corynebacterium, was sent to the hospital for IV antibiotics.    On exam this morning the patient patient endorses her left lower extremity is painful to touch.  She is adamant about not having any treatment for her legs.  Patient states that she was not admitted to the hospital and did not come to the hospital because of her legs.  Patient endorses she is on chemotherapy that she does not want to stop and was told that her a Avastin  for pancreatic cancer would have to be stopped for any kind of procedures.  Patient's nurse was at the bedside this morning with me.  Patient is refusing the wound care nurses recommendations for cleaning and treatment.  She is also refusing any bilateral lower extremity wraps.  I recommend that the patient undergo an MRI or CT scan due to the fact that she has a pacemaker to evaluate for osteomyelitis.  Patient has even refused any kind of testing.    Vitals:   01/13/24 1446  01/13/24 1508  BP: (!) 89/72 (!) 147/84  Pulse: 65   Resp: 18   Temp: 97.7 F (36.5 C)   SpO2: 99%    Physical Exam: Cardiac:  Irregular Rhythm Hx of Afib. N murmur appreciated.  Lungs: Clear on auscultation throughout, normal labored breathing. Incisions: None Extremities: Bilateral lower extremities with hyperkeratosis.  +2 edema with two 1 cm ulcerations to the patient's left lower extremity with gangrene.  Weeping noted to the bed sheets.  Abdomen: Positive bowel sounds throughout, soft, nontender nondistended. Neurologic: Alert and oriented x 3, answers all questions and follows commands.  Patient does not appear competent enough to make educated decisions regarding her care.  CBC    Component Value Date/Time   WBC 4.2 01/11/2024 0438   RBC 3.79 (L) 01/11/2024 0438   HGB 9.1 (L) 01/11/2024 0438   HGB 9.9 (L) 01/07/2024 1355   HGB 8.1 (L) 06/09/2023 1456   HCT 31.0 (L) 01/11/2024 0438   HCT 28.9 (L) 06/09/2023 1456   PLT 176 01/11/2024 0438   PLT 217 01/07/2024 1355   PLT 256 06/09/2023 1456   MCV 81.8 01/11/2024 0438   MCV 84 06/09/2023 1456   MCH 24.0 (L) 01/11/2024 0438   MCHC 29.4 (L) 01/11/2024 0438   RDW 20.4 (H) 01/11/2024 0438   RDW 19.5 (H) 06/09/2023 1456   LYMPHSABS 0.6 (L) 01/09/2024 1420   LYMPHSABS 0.6 (L) 03/22/2019 1415   MONOABS 0.6 01/09/2024 1420   EOSABS 0.0 01/09/2024 1420   EOSABS 0.2 03/22/2019 1415   BASOSABS 0.0 01/09/2024 1420   BASOSABS 0.0 03/22/2019 1415    BMET    Component Value  Date/Time   NA 135 01/12/2024 0504   NA 139 11/26/2023 1459   K 4.4 01/12/2024 0504   CL 101 01/12/2024 0504   CO2 23 01/12/2024 0504   GLUCOSE 95 01/12/2024 0504   BUN 47 (H) 01/12/2024 0504   BUN 56 (H) 11/26/2023 1459   CREATININE 1.82 (H) 01/12/2024 0504   CREATININE 2.39 (H) 10/01/2023 1505   CREATININE 2.12 (H) 06/23/2023 1518   CALCIUM  8.5 (L) 01/12/2024 0504   GFRNONAA 27 (L) 01/12/2024 0504   GFRNONAA 19 (L) 10/01/2023 1505   GFRAA 39 (L)  12/23/2019 1458    INR    Component Value Date/Time   INR 1.2 09/03/2023 1600   INR 2.2 01/19/2010 1536     Intake/Output Summary (Last 24 hours) at 01/13/2024 1638 Last data filed at 01/13/2024 1300 Gross per 24 hour  Intake 240 ml  Output 1850 ml  Net -1610 ml     Assessment/Plan:  89 y.o. female who presents to [facility] emergency department with worsening pain and erythema from her left lower extremity sent in by dermatology for IV antibiotics. Patient's wounds were swabbed by her dermatologist and they have both grown out MRSA and Cornia bacterium  * No surgery found *   PLAN  CT scan shows no bony destruction at left lower tibial fibula or any other bony destruction in her ankle or right foot.  Therefore with the patient refusing any intervention, wound care or dressings vascular surgery will sign off this case at this time.  DVT prophylaxis:   Lovenox  30 mg SQ Q24    [name] R Anaiah Mcmannis [facility] 01/13/2024 4:38 PM

## 2024-01-13 NOTE — Telephone Encounter (Signed)
 Per Dr. Raymund sensitivity test not back from [facility], but pt has been admitted to the hospital and is on IV antibiotics which should be sufficient treatment.

## 2024-01-13 NOTE — Telephone Encounter (Signed)
-----   Message from Lauraine JAYSON Kanaris sent at 01/06/2024  2:27 PM EDT ----- Can someone please check with [facility] to see if they can run sensitivities on the corynebacterium? Thx!  ----- Message ----- From: Rebecka Memos Lab Results In Sent: 01/06/2024  11:35 AM EDT To: Lauraine JAYSON Kanaris, MD

## 2024-01-13 NOTE — Progress Notes (Addendum)
 0900 Pt refuses BLE dressing change today  1507 Pt refused dressing change. Allowed nurse to cleanse wound, left open to air.

## 2024-01-13 NOTE — Plan of Care (Signed)

## 2024-01-13 NOTE — Progress Notes (Addendum)
 PROGRESS NOTE    [name]  FMW:[id number] DOB: 06/08/1933 DOA: 01/09/2024 PCP: [name], MD  Chief Complaint  Patient presents with   Wound Infection    Hospital Course:  [name] is a 89 y.o. 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 Patient has 2 lower extremity ulcers, poor healing.  Has been on antibiotics, cultures growing MRSA and Corynebacterium, was sent to the hospital for IV antibiotics.    Subjective: Patient was examined at the bedside, states pain is now worse.  Increased tramadol  and Tylenol . CT with no bony destruction.  PT consult as patient reports feeling unsteady walking   Objective: Vitals:   01/13/24 0333 01/13/24 0900 01/13/24 1446 01/13/24 1508  BP: 135/63 133/71 (!) 89/72 (!) 147/84  Pulse: 60 (!) 59 65   Resp: 18 18 18    Temp: 98 F (36.7 C) 97.6 F (36.4 C) 97.7 F (36.5 C)   TempSrc: Oral Oral Oral   SpO2: 100% 100% 99%   Weight:      Height:        Intake/Output Summary (Last 24 hours) at 01/13/2024 1858 Last data filed at 01/13/2024 1300 Gross per 24 hour  Intake 240 ml  Output 1850 ml  Net -1610 ml   Filed Weights   01/09/24 2145  Weight: 57 kg    Examination: Constitutional: Chronically ill looking, NAD, calm, comfortable Neck: normal, supple, no masses, no thyromegaly Respiratory: clear to auscultation bilaterally,  Normal respiratory effort. No accessory muscle use.  Cardiovascular:  Regular rate and rhythm, no murmurs / rubs / gallops. No extremity edema. 2+ pedal pulses. No carotid bruits.  Abdomen: no tenderness, no masses palpated. No hepatosplenomegaly. Bowel sounds positive.  Musculoskeletal: Good range of motion, no joint swelling or tenderness, Skin: Bilateral lower extremity chronic stasis dermatitis with 2 open wounds which are deep about 5 x 6 cm in diameter with crusted base Neurologic: CN 2-12 grossly intact. Sensation intact, DTR normal. Strength 5/5 in all 4  Assessment & Plan:  Principal Problem:   Leg wound, left, initial encounter Active Problems:   Cellulitis of left lower extremity   Acute on chronic diastolic CHF (congestive heart failure) (HCC)   Normocytic anemia   Primary pancreatic neuroendocrine tumor   Hyperlipidemia   HOCM/IHSS   Atrial fibrillation, permanent (HCC)   COPD (chronic obstructive pulmonary disease) (HCC)   Acute on chronic kidney failure (HCC)   ICD (implantable cardioverter-defibrillator) in place   Hypertension   PAD (peripheral artery disease) (HCC)   History of nephrectomy secondary to RCC   Pulmonary hypertension (HCC)   MRSA (methicillin resistant staph aureus) culture positive  Infected left lower extremity wound with surrounding cellulitis - Patient has outpatient culture apparently growing MRSA and corynebacterium - XR left tibia/fibula large soft tissue defect within the distal left lower leg unchanged, persistent diffuse subcutaneous edema.  No acute/destructive bony abnormalities - US  negative for DVT bilaterally, US  ABI right ABI 0.78, left could not be evaluated - CT with no bony destruction - Continue linezolid  x 7 days, discontinue cefepime , Seen by ID - Follow-up blood cultures - Seen by  WOC, recommend debridement - Seen by vascular surgery, intraoperative debridement would be appropriate, unclear etiology - Discussed with oncology, will not be able to do debridement for 4 to 6 weeks as patient has been  on Avastin .  If there is plan for intervention, we will have to hold Avastin  and anemia will get worse - Seen by orthopedics, appreciate recs.  No signs of bone involvement - Seen by ID, appreciate recs - Pain management with Tylenol , tramadol  (inc 50 q6)  Hypokalemia - resolved -magnesium  normal - Resume home supplements  AKI on CKD 3b/IV - Cr improved s/p IV fluids, Baseline Cr 1.7-1.9 - Monitor Cr  Normocytic anemia - Monitor Hb   Chronic diastolic heart failure Hypertrophic cardiomyopathy Pulmonary HTN - Continue home Torsemide  and Metolazone   COPD with chronic hypoxic respiratory failure On home oxygen  2L [place] Continue home inhalers   Hx pancreatic neuroendocrine cancer Patient is on chemotherapy.  Continue follow-up with oncologist outpatient   Hyperlipidemia: Continue with statin   CAD: Status post ICD placement.  Continue to monitor   Peripheral vascular disease: Will continue home regimen.  PT eval    DVT prophylaxis: Lovenox  SQ   Code Status: Limited: Do not attempt resuscitation (DNR) -DNR-LIMITED -Do Not Intubate/DNI  Disposition:  TBD  Consultants:  Orthopedic surgery Infectious disease Vascular surgery  Procedures:  None  Antimicrobials:  Anti-infectives (From admission, onward)    Start     Dose/Rate Route Frequency Ordered Stop   01/13/24 2200  linezolid  (ZYVOX ) IVPB 600 mg  Status:  Discontinued        600 mg 300 mL/hr over 60 Minutes Intravenous Every 12 hours 01/13/24 1412 01/13/24 1714   01/13/24 2200  linezolid  (ZYVOX ) tablet 600 mg        600 mg Oral Every 12 hours 01/13/24 1714 01/17/24 0959   01/09/24 2200  linezolid  (ZYVOX ) IVPB 600 mg  Status:  Discontinued        600 mg 300 mL/hr over 60 Minutes Intravenous Every 12 hours 01/09/24 1759 01/13/24 1412   01/09/24 1930  ceFEPIme  (MAXIPIME ) 2 g in sodium chloride  0.9 % 100 mL IVPB  Status:  Discontinued        2 g 200 mL/hr over 30 Minutes Intravenous Every 24 hours 01/09/24 1915  01/12/24 2346       Data Reviewed: I have personally reviewed following labs and imaging studies CBC: Recent Labs  Lab 01/07/24 1355 01/09/24 1420 01/09/24 1947 01/10/24 0712 01/11/24 0438  WBC 5.2 5.0 6.4 3.7* 4.2  NEUTROABS 3.9 3.8  --   --   --   HGB 9.9* 9.5* 9.2* 9.1* 9.1*  HCT 32.6* 32.0* 30.8* 30.1* 31.0*  MCV 78.2* 80.8 81.3 79.8* 81.8  PLT 217 208 196 186 176   Basic Metabolic Panel: Recent Labs  Lab 01/09/24 1420 01/09/24 1947 01/10/24 0712 01/11/24 0438 01/12/24 0504  NA 137  --  137 134* 135  K 3.4*  --  3.4* 3.7 4.4  CL 94*  --  102 98 101  CO2 28  --  27 27 23   GLUCOSE 144*  --  145* 95 95  BUN 65*  --  60* 53* 47*  CREATININE 1.83* 2.22* 1.78* 1.66* 1.82*  CALCIUM  9.3  --  8.4* 8.4* 8.5*  MG  --   --  2.1  --  2.1   GFR: Estimated Creatinine Clearance: 19.6 mL/min (A) (by C-G formula based on SCr of 1.82 mg/dL (H)). Liver Function Tests: Recent Labs  Lab 01/09/24 1835 01/10/24 0712  AST 21 27  ALT 8 12  ALKPHOS 56 53  BILITOT 0.8 0.6  PROT 8.3* 6.5  ALBUMIN  4.0 3.1*   CBG: No results for input(s): GLUCAP in the last 168 hours.  Recent Results (from the past 240 hours)  Blood culture (routine x 2)     Status: None (Preliminary result)   Collection Time: 01/09/24  6:34 PM   Specimen: BLOOD  Result Value Ref Range Status   Specimen Description BLOOD LEFT ANTECUBITAL  Final   Special Requests   Final    BOTTLES DRAWN AEROBIC AND ANAEROBIC Blood Culture results may not be optimal due to an inadequate volume of blood received in culture bottles   Culture   Final    NO GROWTH 3 DAYS Performed at [facility], [address], [place], [place] [address]    Report Status PENDING  Incomplete  Blood culture (routine x 2)     Status: None (Preliminary result)   Collection Time: 01/09/24  9:56 PM   Specimen: BLOOD  Result Value Ref Range Status   Specimen Description BLOOD BLOOD RIGHT ARM  Final   Special Requests   Final     BOTTLES DRAWN AEROBIC AND ANAEROBIC Blood Culture results may not be optimal due to an inadequate volume of blood received in culture bottles   Culture   Final    NO GROWTH 3 DAYS Performed at [facility], [address] [place], [place] [address]    Report Status PENDING  Incomplete     Radiology Studies: CT FOOT LEFT WO CONTRAST Result Date: 01/13/2024 CLINICAL DATA:  Osteomyelitis EXAM: CT OF THE LEFT FOOT WITHOUT CONTRAST TECHNIQUE: Multidetector CT imaging of the left foot was performed according to the standard protocol. Multiplanar CT image reconstructions were also generated. RADIATION DOSE REDUCTION: This exam was performed according to the departmental dose-optimization program which includes automated exposure control, adjustment of the mA and/or kV according to patient size and/or use of iterative reconstruction technique. COMPARISON:  01/01/2024 radiographs FINDINGS: Bones/Joint/Cartilage Bony demineralization. No bony destructive findings characteristic of osteomyelitis. No fracture identified. Small plantar and Achilles calcaneal spurs. Ligaments Suboptimally assessed by CT. Muscles and Tendons Unremarkable Soft tissues Cutaneous thickening in lobularity along the distal calf, cannot exclude blistering. This extends into the ankle and especially into the dorsum of the foot, with potential cutaneous ulceration dorsally overlying the cuboid on image 48 series 10. Confluent subcutaneous edema just plantar to the proximal plantar fascia, potentially from mild adventitial bursitis. IMPRESSION: 1. No bony destructive findings characteristic of osteomyelitis. 2. Cutaneous thickening and lobularity along the distal calf, ankle, and dorsum of the foot, with potential cutaneous ulceration dorsally overlying the cuboid. 3. Confluent subcutaneous edema just plantar to the proximal plantar fascia, potentially from mild adventitial bursitis. 4. Bony demineralization. 5. Small plantar and  Achilles calcaneal spurs. Electronically Signed   By: Ryan Salvage M.D.   On: 01/13/2024 11:21   CT TIBIA FIBULA LEFT WO CONTRAST Result Date: 01/13/2024 CLINICAL DATA:  Osteomyelitis EXAM: CT OF THE LOWER LEFT EXTREMITY WITHOUT CONTRAST TECHNIQUE: Multidetector CT imaging of the lower left extremity was performed according to the standard protocol. RADIATION DOSE REDUCTION: This exam was performed according to the departmental dose-optimization program which includes automated exposure control, adjustment of the mA and/or kV according to patient size and/or use of iterative reconstruction technique. COMPARISON:  Radiographs 01/09/2024 FINDINGS: Bones/Joint/Cartilage Bony demineralization. Mild chronic periostitis in the tibia and fibula, probably a  manifestation of venous insufficiency. No bony destructive findings characteristic of osteomyelitis. Ligaments Suboptimally assessed by CT. Muscles and Tendons Subtle loss of fat planes along the distal tibialis anterior and extensor digitorum longus myotendinous junctions for example on image 161 series 8, potentially reflecting low-grade myositis underlying the large volar ulceration. No drainable abscess observed. Soft tissues Cutaneous irregularity especially in the lower calf and especially anteriorly and tracking into the ankle. Cutaneous and subcutaneous ulceration at the distal metadiaphyseal level anteriorly on image 158 series 6. Cannot exclude cutaneous blistering along the lower calf. No appreciable drainable abscess. IMPRESSION: 1. Cutaneous and subcutaneous ulceration at the distal metadiaphyseal level anteriorly. Cannot exclude cutaneous blistering along the lower calf. No appreciable drainable abscess. 2. Subtle loss of fat planes along the distal tibialis anterior and extensor digitorum longus myotendinous junctions, potentially reflecting low-grade myositis underlying the large volar ulceration. 3. No bony destructive findings characteristic of  osteomyelitis. 4. Mild chronic periostitis in the tibia and fibula, probably a manifestation of venous insufficiency. 5. Bony demineralization. Electronically Signed   By: Ryan Salvage M.D.   On: 01/13/2024 11:15    Scheduled Meds:  enoxaparin  (LOVENOX ) injection  30 mg Subcutaneous Q24H   feeding supplement  237 mL Oral BID BM   leptospermum manuka honey  1 Application Topical Daily   linezolid   600 mg Oral Q12H   metolazone   2.5 mg Oral Once per day on Monday Wednesday Friday   potassium chloride  SA  20 mEq Oral BID   rosuvastatin   10 mg Oral Daily   torsemide   100 mg Oral BID   Continuous Infusions:     LOS: 4 days  MDM: Patient is high risk for one or more organ failure.  They necessitate ongoing hospitalization for continued IV therapies and subsequent lab monitoring. Total time spent interpreting labs and vitals, reviewing the medical record, coordinating care amongst consultants and care team members, directly assessing and discussing care with the patient and/or family: 55 min  Laree Lock, MD [facility]  To contact the attending physician between 7A-7P please use Epic Chat. To contact the covering physician during after hours 7P-7A, please review Amion.  01/13/2024, 6:58 PM   *This document has been created with the assistance of dictation software. Please excuse typographical errors. *

## 2024-01-14 DIAGNOSIS — E43 Unspecified severe protein-calorie malnutrition: Secondary | ICD-10-CM | POA: Insufficient documentation

## 2024-01-14 DIAGNOSIS — S81802A Unspecified open wound, left lower leg, initial encounter: Secondary | ICD-10-CM | POA: Diagnosis not present

## 2024-01-14 LAB — CULTURE, BLOOD (ROUTINE X 2)
Culture: NO GROWTH
Culture: NO GROWTH

## 2024-01-14 MED ORDER — BOOST / RESOURCE BREEZE PO LIQD CUSTOM
1.0000 | Freq: Three times a day (TID) | ORAL | Status: DC
  Administered 2024-01-14 – 2024-01-25 (×16): 1 via ORAL

## 2024-01-14 MED ORDER — PROSOURCE PLUS PO LIQD
30.0000 mL | Freq: Three times a day (TID) | ORAL | Status: DC
  Administered 2024-01-14 – 2024-01-17 (×5): 30 mL via ORAL

## 2024-01-14 MED ORDER — ZINC SULFATE 220 (50 ZN) MG PO CAPS
220.0000 mg | ORAL_CAPSULE | Freq: Every day | ORAL | Status: DC
Start: 2024-01-14 — End: 2024-01-26
  Administered 2024-01-14 – 2024-01-26 (×13): 220 mg via ORAL
  Filled 2024-01-14 (×13): qty 1

## 2024-01-14 MED ORDER — ADULT MULTIVITAMIN W/MINERALS CH
1.0000 | ORAL_TABLET | Freq: Every day | ORAL | Status: DC
  Administered 2024-01-14 – 2024-01-26 (×13): 1 via ORAL
  Filled 2024-01-14 (×13): qty 1

## 2024-01-14 NOTE — Progress Notes (Addendum)
 PROGRESS NOTE    AMEIRAH KHATOON  FMW:[id number] DOB: 10-14-1933 DOA: 01/09/2024 PCP: [name], MD  132A/132A-AA  LOS: 5 days   Brief hospital course:   Assessment & Plan: CANDACE BEGUE is a 89 y.o. 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 Patient has 2 lower extremity ulcers, poor healing.  Has been on antibiotics, cultures growing MRSA and Corynebacterium, was sent to the hospital for IV antibiotics.    Infected left lower extremity wound with surrounding cellulitis - Patient has outpatient culture apparently growing MRSA and corynebacterium - XR left tibia/fibula large soft tissue defect within the distal left lower leg unchanged, persistent diffuse subcutaneous edema.  No acute/destructive bony abnormalities - US  negative for DVT bilaterally, US  ABI right ABI 0.78, left could not be evaluated - CT with no bony destruction - Continue linezolid  x 7 days, discontinue cefepime , Seen by ID - Follow-up blood cultures - Seen by WOC, recommend debridement - Seen by vascular surgery, intraoperative debridement would be appropriate, unclear etiology - Discussed with oncology, will not be able to do debridement for 4 to 6 weeks as patient has been on Avastin .  If there is plan for intervention, we will have to hold Avastin  and anemia will get worse - Seen by orthopedics, appreciate recs.  No signs of bone involvement - Seen by ID, appreciate recs --cont  Linezolid  --wound care per order   Hypokalemia - resolved --cont potassium suppl scheduled   AKI on CKD 3b/IV - Cr improved s/p IV fluids, Baseline Cr 1.7-1.9 Oral hydration now  Normocytic anemia - Monitor Hb   Chronic diastolic heart failure Hypertrophic cardiomyopathy Status post ICD placement.   Pulmonary HTN - Continue home Torsemide  and Metolazone    COPD with chronic hypoxic respiratory failure On home oxygen  2L [place] --stable   Hx pancreatic neuroendocrine cancer Patient is on chemotherapy.   Continue follow-up with oncologist outpatient   Hyperlipidemia:  Continue with statin   CAD:  --cont statin   Peripheral vascular disease:  --cont statin  Hyponatremia --has hx of intermittent hyponatremia --monitor   DVT prophylaxis: Lovenox  SQ Code Status: DNR  Family Communication:  Level of care: Med-Surg Dispo:   The patient is from: home Anticipated d/c is to: SNF rehab Anticipated d/c date is: whenever SNF accepts   Subjective and Interval History:  Pt reported pain in her legs.   Objective: Vitals:   01/13/24 2017 01/14/24 0318 01/14/24 0746 01/14/24 1552  BP: 129/64 (!) 138/59 (!) 139/46 127/67  Pulse: (!) 59 (!) 59 (!) 59 (!) 59  Resp: 18 18 16 17   Temp: 97.9 F (36.6 C) 98.5 F (36.9 C) 97.9 F (36.6 C) 98 F (36.7 C)  TempSrc: Oral Oral    SpO2: 100% 100% 100% 100%  Weight:      Height:        Intake/Output Summary (Last 24 hours) at 01/14/2024 2036 Last data filed at 01/14/2024 1900 Gross per 24 hour  Intake 360 ml  Output 1700 ml  Net -1340 ml   Filed Weights   01/09/24 2145  Weight: 57 kg    Examination:  Constitutional: NAD, AAOx3 HEENT: conjunctivae and lids normal, EOMI CV: No cyanosis.   RESP: normal respiratory effort Extremities: blackened skin and wounds in both legs Neuro: II - XII grossly intact.   Psych: Normal mood and affect.  Appropriate judgement and reason   Data Reviewed: I have personally reviewed  labs and imaging studies  Time spent: 50 minutes  Ellouise Haber, MD [facility] If 7PM-7AM, please contact night-coverage 01/14/2024, 8:36 PM

## 2024-01-14 NOTE — Progress Notes (Signed)
 Pt slept all night; awake and concern feeling weird and talking to self, pt c/o she does not feel good and does not think she will leave today; vitals taken WNL/ baseline; pt stated she had a nightmare too; pt voiced pain on both legs, tramadol  given x1; wound cleaned/ honey cream applied on wound bed, but refuses to apply silicone/ kerlix; Pt felt better and went back to sleep. Will continue to monitor.

## 2024-01-14 NOTE — Progress Notes (Signed)
 1001 Pt very stern about refusing dressing changes to BLE. This nurse explained importance of cleaning wounds, esp since legs were weeping after working with PT. Pt agreed to have legs cleaned and pat dry with vashe and medihoney applied. Pt still refusing to have BLE covered per orders.

## 2024-01-14 NOTE — Evaluation (Signed)
 Physical Therapy Evaluation Patient Details Name: [name] MRN: [id number] DOB: Apr 19, 1934 Today's Date: 01/14/2024  History of Present Illness  Pt is a 89 y/o F presenting to ED with c/o MRSA in LLE, BLE wounds. Imaging negative for DVT, osteomyelitis. PMH significant for PVD, pancreatic CA on chemotherapy, HRpEF, CKD-III, hx of nephrectomy, squamous cell carcinoma, HLD, pacemaker, COPD with chronic O2 use at home.   Clinical Impression  Pt A&Ox4, agreeable to PT evaluation, cited pain in BLEs during session but no specific number rating. At baseline, pt reports she is modI with amb with a broom or SPC, IND with basic ADLs. Pt able to initiate bed mobility with CGA to achieve sitting, slow moving and required frequent redirection to task. STS from EOB and BSC with CGA-minA, mod verbal and visual cues for hand placement on RW. Pt amb~ 5ft to/from [facility] with shuffled gait pattern, very slowed cadence, required frequent reminder during amb to maintain both hands on RW. Pt completed pericare with set-up assist. Pt was returned to supine at end of session with minA for trunk control, all needs within reach, RN at bedside. Pt is displaying deficits in functional mobility, balance, LE strength, and activity tolerance and would benefit from skilled PT intervention to address listed deficits and allow for safe return to PLOF.       If plan is discharge home, recommend the following: A lot of help with walking and/or transfers;A lot of help with bathing/dressing/bathroom;Assistance with cooking/housework;Direct supervision/assist for medications management;Assist for transportation;Help with stairs or ramp for entrance   Can travel by private vehicle   No    Equipment Recommendations Other (comment) (TBD)  Recommendations for Other Services  OT consult    Functional Status Assessment Patient has had a recent decline in their functional status and demonstrates the ability to make significant  improvements in function in a reasonable and predictable amount of time.     Precautions / Restrictions Precautions Precautions: Fall;ICD/Pacemaker Recall of Precautions/Restrictions: Impaired Precaution/Restrictions Comments: pacemaker, BLE wounds Restrictions Weight Bearing Restrictions Per Provider Order: No      Mobility  Bed Mobility Overal bed mobility: Needs Assistance Bed Mobility: Supine to Sit, Sit to Supine     Supine to sit: Contact guard, HOB elevated, Used rails Sit to supine: Contact guard assist, HOB elevated   General bed mobility comments: able to initiate bed mobility with in time/effort and max encouragement, sit > supine with CGA at trunk    Transfers Overall transfer level: Needs assistance Equipment used: Rolling walker (2 wheels) Transfers: Sit to/from Stand Sit to Stand: Contact guard assist, Min assist           General transfer comment: CGA STS from EOB, minA STS from [facility] with VC for hand placement    Ambulation/Gait Ambulation/Gait assistance: Min assist Gait Distance (Feet): 6 Feet Assistive device: Rolling walker (2 wheels) Gait Pattern/deviations: Step-to pattern, Shuffle, Trunk flexed Gait velocity: decreased     General Gait Details: Pt amb ~3 ft to/from [facility] with RW and minA, minimal foot clearance bilaterally, very slow cadence, VC to maintain both hands on RW  Stairs            Wheelchair Mobility     Tilt Bed    Modified Rankin (Stroke Patients Only)       Balance Overall balance assessment: Needs assistance Sitting-balance support: Feet supported Sitting balance-Leahy Scale: Good Sitting balance - Comments: able to maintain upright without assistance   Standing balance support: Bilateral upper  extremity supported, Single extremity supported, No upper extremity supported Standing balance-Leahy Scale: Fair Standing balance comment: unsteady at times, no LOB                             Pertinent  Vitals/Pain Pain Assessment Pain Assessment: Faces Faces Pain Scale: Hurts a little bit Pain Location: BLE Pain Descriptors / Indicators: Grimacing Pain Intervention(s): Monitored during session, Repositioned    Home Living Family/patient expects to be discharged to:: Private residence Living Arrangements: Other relatives Biomedical scientist) Available Help at Discharge: Family;Available PRN/intermittently Type of Home: House Home Access: Stairs to enter Entrance Stairs-Rails: Right;Left;Can reach both Entrance Stairs-Number of Steps: 2 steps with B railings from back entrance; 2 steps with L railing from front entrance   Home Layout: One level Home Equipment: Agricultural consultant (2 wheels);Cane - single point;Shower seat      Prior Function Prior Level of Function : Independent/Modified Independent             Mobility Comments: Pt cites using a broom for walking, SPC for amb outside of the house, cites fear of falling ADLs Comments: Pt states that she is IND with basic ADLs, able to sponge bathe herself     Extremity/Trunk Assessment   Upper Extremity Assessment Upper Extremity Assessment: Generalized weakness    Lower Extremity Assessment Lower Extremity Assessment: Generalized weakness (not formally assessed 2/2 BLE wounds)       Communication   Communication Communication: No apparent difficulties    Cognition Arousal: Alert Behavior During Therapy: Anxious   PT - Cognitive impairments: No apparent impairments                       PT - Cognition Comments: A&Ox4, displays perseverance on a fly in her room during session that was not present, frequent redirection to task required Following commands: Impaired Following commands impaired: Follows one step commands inconsistently, Follows one step commands with increased time     Cueing Cueing Techniques: Verbal cues, Visual cues     General Comments      Exercises     Assessment/Plan    PT  Assessment Patient needs continued PT services  PT Problem List Decreased range of motion;Decreased activity tolerance;Decreased balance;Decreased mobility;Decreased knowledge of use of DME;Decreased safety awareness;Pain       PT Treatment Interventions DME instruction;Gait training;Functional mobility training;Therapeutic activities;Therapeutic exercise;Balance training;Neuromuscular re-education;Cognitive remediation;Patient/family education    PT Goals (Current goals can be found in the Care Plan section)  Acute Rehab PT Goals Patient Stated Goal: to get better PT Goal Formulation: With patient Time For Goal Achievement: 01/28/24 Potential to Achieve Goals: Fair    Frequency Min 2X/week     Co-evaluation               AM-PAC PT 6 Clicks Mobility  Outcome Measure Help needed turning from your back to your side while in a flat bed without using bedrails?: A Little Help needed moving from lying on your back to sitting on the side of a flat bed without using bedrails?: A Little Help needed moving to and from a bed to a chair (including a wheelchair)?: A Little Help needed standing up from a chair using your arms (e.g., wheelchair or bedside chair)?: A Little Help needed to walk in hospital room?: A Lot Help needed climbing 3-5 steps with a railing? : A Lot 6 Click Score: 16    End of Session  Equipment Utilized During Treatment: Oxygen  Activity Tolerance: Patient tolerated treatment well Patient left: in bed;with call bell/phone within reach;with bed alarm set;with nursing/sitter in room Nurse Communication: Mobility status PT Visit Diagnosis: Unsteadiness on feet (R26.81);Other abnormalities of gait and mobility (R26.89);Muscle weakness (generalized) (M62.81);Difficulty in walking, not elsewhere classified (R26.2);Pain Pain - Right/Left:  (bilateral) Pain - part of body: Leg    Time: 0902-0950 PT Time Calculation (min) (ACUTE ONLY): 48 min   Charges:   PT  Evaluation $PT Eval Low Complexity: 1 Low PT Treatments $Therapeutic Activity: 38-52 mins PT General Charges $$ ACUTE PT VISIT: 1 Visit         Janell Axe, SPT

## 2024-01-14 NOTE — Plan of Care (Signed)
  Problem: Clinical Measurements: Goal: Ability to maintain clinical measurements within normal limits will improve Outcome: Progressing   Problem: Activity: Goal: Risk for activity intolerance will decrease Outcome: Progressing   Problem: Coping: Goal: Level of anxiety will decrease Outcome: Progressing   Problem: Safety: Goal: Ability to remain free from injury will improve Outcome: Progressing   Problem: Pain Managment: Goal: General experience of comfort will improve and/or be controlled Outcome: Progressing

## 2024-01-14 NOTE — Progress Notes (Signed)
 Initial Nutrition Assessment  DOCUMENTATION CODES:   Severe malnutrition in context of chronic illness  INTERVENTION:   -Liberalize diet to 2 gram sodium diet -D/c Ensure Plus High Protein po BID, each supplement provides 350 kcal and 20 grams of protein  -30 ml Prosource Plus BID, each supplement provides 100 kcals and 15 grams protein -Boost Breeze po TID, each supplement provides 250 kcal and 9 grams of protein  -15 ml liquid MVI daily -500 mg vitamin C  BID -220 mg zinc  sulfate daily x 14 days -RD will draw labs and evaluate for micronutrient deficiencies which may impede wound healing: vitamin A  NUTRITION DIAGNOSIS:   Severe Malnutrition related to chronic illness as evidenced by moderate fat depletion, severe fat depletion, moderate muscle depletion, severe muscle depletion, edema, percent weight loss.  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   Reviewed I/O's: -2.4 L x 24 hours and -4 L since admission  UOP: 2.7 L x 24 hours  Spoke with pt, who was sitting up in recliner chair at time of visit. Pt eating spaghetti at time of visit; eager to engage RD in conversation. Pt reports feeling poorly  today secondary to ongoing pain in her legs.   Pt reports poor appetite, states she does not feel like eating. She consumed a few biters of roll and about 25% of spaghetti. Pt reports that her appetite has been fair for quite some time, stating she has good days and bad days. Pt generally consumes 2 meals per day, but states they are not big meals. Pt consumes yogurt and fruit for breakfast and a meat, starch, and vegetable for dinner. Breakfast is usually her best meal and she consumes it mid morning. Meal completions documented at 25-100%.   Pt shares that she has lost a lot weight in the past year, which she contributes to wounds. Per pt, she is followed by the wound center and notes that would are very painful and also has limited mobility from suspected neuropathy. Pt takes MVI, vitamin C , and vitamin D . Pt reports she has trouble swallowing pills whole and takes gummy vitamins at home. Pt amenable to vitamins; RD informed no gummy form available, but can also take crushed in applesauce and in liquid form.   Reviewed wt hx; pt has experienced a 10% wt loss over the past 5 months, which is significant for time frame.   Discussed importance of good meal and supplement intake to promote healing. Pt does not like Ensure, but amenable to alternative supplements.   Medications reviewed and include potassium chloride  and torsemide .   Labs reviewed.   NUTRITION - FOCUSED PHYSICAL EXAM:  Flowsheet Row Most Recent Value  Orbital Region Moderate depletion  Upper Arm Region Severe depletion  Thoracic and Lumbar Region Severe depletion  Buccal Region Moderate depletion  Temple Region Moderate depletion  Clavicle Bone Region Severe depletion  Clavicle and Acromion Bone Region Severe depletion  Scapular Bone Region Severe depletion  Dorsal Hand Severe depletion  Patellar Region Severe depletion  Anterior Thigh Region Severe depletion  Posterior Calf Region Severe depletion  Edema (RD  Assessment) Mild  Hair Reviewed  Eyes Reviewed  Mouth Reviewed  Skin Reviewed  Nails Reviewed    Diet Order:   Diet Order             Diet 2 gram sodium Fluid consistency: Thin  Diet effective now                   EDUCATION NEEDS:   Education needs have been addressed  Skin:  Skin Assessment: Skin Integrity Issues: Skin Integrity Issues:: Stage III Stage III: rt anterior heel, lt anterior foot, lt pretibial  Last BM:  01/14/24  Height:   Ht Readings from Last 1 Encounters:  01/09/24 5' 6 (1.676 m)    Weight:   Wt Readings from Last 1 Encounters:  01/09/24 57 kg    Ideal Body Weight:  59.1 kg  BMI:  Body mass index is 20.29 kg/m.  Estimated Nutritional Needs:   Kcal:  1700-1900  Protein:  90-105 grams  Fluid:  1.7-1.9 L    Margery ORN, RD, LDN, CDCES Registered Dietitian III Certified Diabetes Care and Education Specialist If unable to reach this RD, please use RD Inpatient group chat on secure chat between hours of 8am-4 pm daily

## 2024-01-15 ENCOUNTER — Telehealth: Payer: Self-pay

## 2024-01-15 ENCOUNTER — Other Ambulatory Visit: Payer: Self-pay

## 2024-01-15 ENCOUNTER — Telehealth: Payer: Self-pay | Admitting: Pharmacy Technician

## 2024-01-15 DIAGNOSIS — S81802A Unspecified open wound, left lower leg, initial encounter: Secondary | ICD-10-CM | POA: Diagnosis not present

## 2024-01-15 LAB — SUSCEPTIBILITY, GRAM POS RODS
Erythromycin MIC: >4 ug/mL
Gentamicin MIC: <=1 ug/mL
Penicillin MIC: >4 ug/mL
Rifampin MIC: <=1 ug/mL
Tetracycline MIC: >8 ug/mL
Vancomycin MIC: <=2 ug/mL

## 2024-01-15 LAB — SPECIMEN STATUS REPORT

## 2024-01-15 MED ORDER — IRON SUCROSE 300 MG IVPB - SIMPLE MED
300.0000 mg | Freq: Once | Status: AC
  Administered 2024-01-15: 300 mg via INTRAVENOUS
  Filled 2024-01-15: qty 300

## 2024-01-15 NOTE — [place] FL2 (Signed)
   MEDICAID FL2 LEVEL OF CARE FORM     IDENTIFICATION  Patient Name: [name] Birthdate: 19-Dec-1933 Sex: female Admission Date (Current Location): 01/09/2024  [facility] and [place] Number:  Chiropodist and Address:  [facility], [address] [place], [place] [address]      Provider Number: [id number]  Attending Physician Name and Address:  Awanda City, MD  Relative Name and Phone Number:       Current Level of Care: Hospital Recommended Level of Care: Skilled Nursing Facility Prior Approval Number:    Date Approved/Denied:   PASRR Number: [id number]  Discharge Plan: SNF    Current Diagnoses: Patient Active Problem List   Diagnosis Date Noted   Protein-calorie malnutrition, severe 01/14/2024   MRSA (methicillin resistant staph aureus) culture positive 01/12/2024   Leg wound, left, initial encounter 01/09/2024   History of bacteremia 09/16/2023   Venous stasis dermatitis 09/14/2023   Pulmonary hypertension due to COPD (HCC) 09/06/2023   Gram-negative bacteremia 09/05/2023   Ulcers of both lower legs (HCC) 09/04/2023   Sepsis due to cellulitis (HCC) 09/03/2023   Presence of heart assist device (HCC) 08/30/2023   Medication management 08/29/2023   FTT (failure to thrive) in adult 06/24/2023   Venous stasis ulcer of left lower leg with edema of left lower leg (HCC) 06/18/2023   Cellulitis of left lower extremity 06/13/2023   Acute on chronic respiratory failure with hypoxia (HCC) 05/28/2023   (HFpEF) heart failure with preserved ejection fraction (HCC)    Renal cell carcinoma s/p nephrectomy 2003(HCC) 11/25/2022   Herpes zoster 09/02/2022   Decreased hearing of right ear 07/25/2022   DNR (do not resuscitate) 07/25/2022   Bilateral impacted cerumen 05/20/2022   Chronic hypoxic respiratory failure, on home oxygen  therapy (HCC) 04/03/2022   Primary pancreatic neuroendocrine tumor 03/20/2022   Osteoporosis 10/11/2021    Squamous cell carcinoma of foot, 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   Acute gout due to renal impairment involving left foot 01/30/2021   History of blood transfusion reaction 04/17/2020   Angiodysplasia of small intestine    Gastric polyps    GI bleed 03/28/2020   Palliative care by specialist    Advanced directives, counseling/discussion    Normocytic anemia 01/06/2020   History of gastritis 11/11/2019   Chronic venous hypertension w ulceration (HCC)    Black stools    Acute on chronic diastolic CHF (congestive heart failure) (HCC) 11/04/2018   Chronic bilateral pleural effusions 11/04/2018   Iliac artery stenosis, bilateral (HCC) 07/14/2017   Chronic blood loss anemia 11/18/2016   Pulmonary hypertension (HCC) 12/04/2015   B12 deficiency 05/22/2015   PAD (peripheral artery disease) (HCC)    History of nephrectomy secondary to RCC    TIA (transient ischemic attack) 08/16/2014   Other fatigue 01/14/2014   Chronic diastolic CHF (congestive heart failure) (HCC) 06/04/2013   Mitral valve regurgitation 06/04/2013   Allergic rhinitis 01/01/2013   Chronic cholecystitis with calculus 09/15/2012   Claudication (HCC) 01/21/2012   Hypertension 05/20/2011   HOCM/IHSS 07/14/2009   Hyperlipidemia 04/10/2009   INCONTINENCE, URGE 04/10/2009   Hypertrophic cardiomyopathy (HCC) 10/27/2008   Atrial fibrillation, permanent (HCC) 10/27/2008   SINUS BRADYCARDIA 10/27/2008   Carotid artery stenosis 10/27/2008   COPD (chronic obstructive pulmonary disease) (HCC) 10/27/2008   Acute on chronic kidney failure (HCC) 10/27/2008   ICD (implantable cardioverter-defibrillator) in place 10/27/2008    Orientation RESPIRATION BLADDER Height & Weight     Self, Time, Situation, Place  O2 (3 L)  Incontinent Weight: 125 lb 11.2 oz (57 kg) Height:  5' 6 (167.6 cm)  BEHAVIORAL SYMPTOMS/MOOD NEUROLOGICAL BOWEL NUTRITION STATUS      Continent Diet (Gram Sodium)  AMBULATORY STATUS COMMUNICATION OF NEEDS Skin   Limited Assist  Verbally Normal                       Personal Care Assistance Level of Assistance  Bathing, Feeding, Dressing Bathing Assistance: Limited assistance Feeding assistance: Independent Dressing Assistance: Limited assistance     Functional Limitations Info  Sight, Hearing, Speech Sight Info: Impaired Hearing Info: Impaired Speech Info: Adequate    SPECIAL CARE FACTORS FREQUENCY  PT (By licensed PT), OT (By licensed OT)     PT Frequency: 5x/week OT Frequency: 5x/week            Contractures      Additional Factors Info  Code Status, Allergies Code Status Info: DNR Limited Allergies Info: Codeine, Dilaudid  (Hydromorphone ), Tape, Amoxil  (Amoxicillin ), Asa (Aspirin ), Ms Contin  (Morphine ), Neurontin  (Gabapentin ), Nsaids           Current Medications (01/15/2024):  This is the current hospital active medication list Current Facility-Administered Medications  Medication Dose Route Frequency Provider Last Rate Last Admin   (feeding supplement) PROSource Plus liquid 30 mL  30 mL Oral TID BM Awanda City, MD   30 mL at 01/14/24 1823   acetaminophen  (TYLENOL ) tablet 1,000 mg  1,000 mg Oral Q8H PRN Ponnala, Shruthi, MD   1,000 mg at 01/14/24 2105   enoxaparin  (LOVENOX ) injection 30 mg  30 mg Subcutaneous Q24H Garba, Mohammad L, MD   30 mg at 01/14/24 1823   feeding supplement (BOOST / RESOURCE BREEZE) liquid 1 Container  1 Container Oral TID BM Awanda City, MD   1 Container at 01/14/24 1823   leptospermum manuka honey (MEDIHONEY) paste 1 Application  1 Application Topical Daily Ponnala, Shruthi, MD   1 Application at 01/14/24 1001   linezolid  (ZYVOX ) tablet 600 mg  600 mg Oral Q12H Zeigler, Dustin G, RPH   600 mg at 01/14/24 2104   metolazone  (ZAROXOLYN ) tablet 2.5 mg  2.5 mg Oral Once per day on Monday Wednesday Friday Jerelene Critchley, MD   2.5 mg at 01/14/24 9046   multivitamin with minerals tablet 1 tablet  1 tablet Oral Daily Awanda City, MD   1 tablet at 01/14/24 1816    ondansetron  (ZOFRAN ) tablet 4 mg  4 mg Oral Q6H PRN Garba, Mohammad L, MD   4 mg at 01/10/24 1837   Or   ondansetron  (ZOFRAN ) injection 4 mg  4 mg Intravenous Q6H PRN Garba, Mohammad L, MD       potassium chloride  SA (KLOR-CON  M) CR tablet 20 mEq  20 mEq Oral BID Ponnala, Shruthi, MD   20 mEq at 01/14/24 2104   prochlorperazine  (COMPAZINE ) injection 5 mg  5 mg Intravenous Q6H PRN Ponnala, Shruthi, MD       rosuvastatin  (CRESTOR ) tablet 10 mg  10 mg Oral Daily Ponnala, Shruthi, MD   10 mg at 01/14/24 0953   torsemide  (DEMADEX ) tablet 100 mg  100 mg Oral BID Ponnala, Shruthi, MD   100 mg at 01/14/24 1815   traMADol  (ULTRAM ) tablet 50 mg  50 mg Oral Q6H PRN Ponnala, Shruthi, MD   50 mg at 01/15/24 0428   zinc  sulfate (50mg  elemental zinc ) capsule 220 mg  220 mg Oral Daily Awanda City, MD   220 mg at 01/14/24 1816  Discharge Medications: Please see discharge summary for a list of discharge medications.  Relevant Imaging Results:  Relevant Lab Results:   Additional Information SSN: [id number]  Alvaro Louder, LCSW

## 2024-01-15 NOTE — Progress Notes (Signed)
 PROGRESS NOTE    [name]  FMW:[id number] DOB: 1933/12/27 DOA: 01/09/2024 PCP: [name] Baller, MD  132A/132A-AA  LOS: 6 days   Brief hospital course:   Assessment & Plan: [name] is a 89 y.o. 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 Patient has 2 lower extremity ulcers, poor healing.  Has been on antibiotics, cultures growing MRSA and Corynebacterium, was sent to the hospital for IV antibiotics.    Infected left lower extremity wound with surrounding cellulitis - Patient has outpatient culture apparently growing MRSA and corynebacterium - XR left tibia/fibula large soft tissue defect within the distal left lower leg unchanged, persistent diffuse subcutaneous edema.  No acute/destructive bony abnormalities - US  negative for DVT bilaterally, US  ABI right ABI 0.78, left could not be evaluated - CT with no bony destruction - Continue linezolid  x 7 days, discontinue cefepime , Seen by ID - Follow-up blood cultures - Seen by WOC, recommend debridement - Seen by vascular surgery, intraoperative debridement would be appropriate, unclear etiology - Discussed with oncology, will not be able to do debridement for 4 to 6 weeks as patient has been on Avastin .  If there is plan for intervention, we will have to hold Avastin  and anemia will get worse - Seen by orthopedics, appreciate recs.  No signs of bone involvement - Seen by ID, appreciate recs --emphasized  again with pt that wounds will not heal without it being covered and kept moist. --cont Linezolid  --wound care per order   Hypokalemia - resolved --cont potassium suppl scheduled --supplement PRN   AKI on CKD 3b/IV - Cr improved s/p IV fluids, Baseline Cr 1.7-1.9 Oral hydration now  Normocytic anemia - Monitor Hb   Chronic diastolic heart failure Hypertrophic cardiomyopathy Status post ICD placement.   Pulmonary HTN - Continue home Torsemide  and Metolazone    COPD with chronic hypoxic respiratory failure On home oxygen  2L [place] --stable   Hx pancreatic neuroendocrine cancer Patient is on chemotherapy.   Continue follow-up with oncologist outpatient   Hyperlipidemia:  --cont statin   CAD:  --cont statin   Peripheral vascular disease:  --cont statin  Hyponatremia --has hx of intermittent hyponatremia --monitor   DVT prophylaxis: Lovenox  SQ Code Status: DNR  Family Communication:  Level of care: Med-Surg Dispo:   The patient is from: home Anticipated d/c is to: SNF rehab Anticipated d/c date is: whenever SNF accepts   Subjective and Interval History:  Pt refused to have her leg wounds covered up.   Objective: Vitals:   01/15/24 0500 01/15/24 0825 01/15/24 1536 01/15/24 1943  BP: 136/88 (!) 155/55 (!) 124/59 127/69  Pulse: (!) 54 (!) 59 61 60  Resp: 18 16 14 15   Temp: 97.9 F (36.6 C) 97.8 F (36.6 C) (!) 97.5 F (36.4 C) 97.6 F (36.4 C)  TempSrc:    Oral  SpO2: 100% 100% 100% 100%  Weight:      Height:        Intake/Output Summary (Last 24 hours) at 01/15/2024 2018 Last data filed at 01/15/2024 1542 Gross per 24 hour  Intake 120 ml  Output 1950 ml  Net -1830  ml   Filed Weights   01/09/24 2145  Weight: 57 kg    Examination:   Constitutional: NAD, AAOx3 HEENT: conjunctivae and lids normal, EOMI CV: No cyanosis.   RESP: normal respiratory effort Extremities: blackened and ulcerated wounds over both lower leg, L>R Neuro: II - XII  grossly intact.   Psych: Normal mood and affect.     Data Reviewed: I have personally reviewed labs and imaging studies  Time spent: 35 minutes  Ellouise Haber, MD [facility] If 7PM-7AM, please contact night-coverage 01/15/2024, 8:18 PM

## 2024-01-15 NOTE — Evaluation (Signed)
 Occupational Therapy Evaluation Patient Details Name: [name] MRN: [id number] DOB: 1933/09/18 Today's Date: 01/15/2024   History of Present Illness   Pt is a 89 y/o F presenting to ED with c/o MRSA in LLE, BLE wounds. Imaging negative for DVT, osteomyelitis. PMH significant for PVD, pancreatic CA on chemotherapy, HRpEF, CKD-III, hx of nephrectomy, squamous cell carcinoma, HLD, pacemaker, COPD with chronic O2 use at home.     Clinical Impressions [name] was seen for OT evaluation this date. Prior to hospital admission, pt was MOD I using SPC as needed. Pt lives with great grand son who works. Pt currently requires CGA + RW for BSC t/f. MAX A don B shoes in sitting. SETUP seated grooming tasks. Pt would benefit from skilled OT to address noted impairments and functional limitations (see below for any additional details). Upon hospital discharge, recommend OT follow up <3 hours/day.      If plan is discharge home, recommend the following:   A little help with walking and/or transfers;A lot of help with bathing/dressing/bathroom     Functional Status Assessment   Patient has had a recent decline in their functional status and demonstrates the ability to make significant improvements in function in a reasonable and predictable amount of time.     Equipment Recommendations   BSC/3in1     Recommendations for Other Services         Precautions/Restrictions   Precautions Precautions: Fall;ICD/Pacemaker Recall of Precautions/Restrictions: Impaired Precaution/Restrictions Comments: pacemaker, BLE wounds Restrictions Weight Bearing Restrictions Per Provider Order: No     Mobility Bed Mobility               General bed mobility comments: not tested    Transfers Overall transfer level: Needs assistance Equipment used: Rolling walker (2 wheels) Transfers: Sit to/from Stand Sit to Stand: Contact guard assist                  Balance Overall  balance assessment: Needs assistance Sitting-balance support: Feet supported Sitting balance-Leahy Scale: Good     Standing balance support: Single extremity supported, During functional activity Standing balance-Leahy Scale: Fair                             ADL either performed or assessed with clinical judgement   ADL Overall ADL's : Needs assistance/impaired                                       General ADL Comments: CGA + RW for BSC t/f. MAX A don B shoes in sitting. SETUP seated grooming tasks      Pertinent Vitals/Pain Pain Assessment Pain Assessment: Faces Faces Pain Scale: Hurts a little bit Pain Location: BLE Pain Descriptors / Indicators: Grimacing Pain Intervention(s): Limited activity within patient's tolerance     Extremity/Trunk Assessment Upper Extremity Assessment Upper Extremity Assessment: Overall WFL for tasks assessed   Lower Extremity Assessment Lower Extremity Assessment: Generalized weakness       Communication Communication Communication: No apparent difficulties   Cognition Arousal: Alert Behavior During Therapy: Anxious Cognition: No family/caregiver present to determine baseline             OT - Cognition Comments: tangential and requires redirection                 Following commands: Impaired Following commands impaired: Follows  one step commands inconsistently, Follows one step commands with increased time     Cueing  General Comments   Cueing Techniques: Verbal cues;Visual cues      Exercises     Shoulder Instructions      Home Living Family/patient expects to be discharged to:: Private residence Living Arrangements: Other relatives Available Help at Discharge: Family;Available PRN/intermittently Type of Home: House Home Access: Stairs to enter [employer] of Steps: 2 steps with B railings from back entrance; 2 steps with L railing from front entrance Entrance  Stairs-Rails: Right;Left;Can reach both Home Layout: One level     Bathroom Shower/Tub: Tub/shower unit;Sponge bathes at baseline   Bathroom Toilet: Handicapped height     Home Equipment: Agricultural consultant (2 wheels);Cane - single point;Shower seat   Additional Comments: Pt's great grandson works 24 hours shifts but is off several days in a row Psychologist, occupational and EMT).  Pt has other family that checks in also.      Prior Functioning/Environment Prior Level of Function : Independent/Modified Independent             Mobility Comments: Pt cites using a broom for walking, SPC for amb outside of the house, cites fear of falling      OT Problem List: Decreased activity tolerance;Decreased range of motion;Impaired balance (sitting and/or standing)   OT Treatment/Interventions: Self-care/ADL training;Therapeutic exercise;Energy conservation;DME and/or AE instruction;Therapeutic activities;Patient/family education      OT Goals(Current goals can be found in the care plan section)   Acute Rehab OT Goals Patient Stated Goal: to go home OT Goal Formulation: With patient Time For Goal Achievement: 01/29/24 Potential to Achieve Goals: Good ADL Goals Pt Will Perform Grooming: with modified independence;standing Pt Will Perform Lower Body Dressing: sit to/from stand;with supervision Pt Will Transfer to Toilet: with modified independence;ambulating;regular height toilet   OT Frequency:  Min 2X/week    Co-evaluation              AM-PAC OT 6 Clicks Daily Activity     Outcome Measure Help from another person eating meals?: None Help from another person taking care of personal grooming?: A Little Help from another person toileting, which includes using toliet, bedpan, or urinal?: A Little Help from another person bathing (including washing, rinsing, drying)?: A Lot Help from another person to put on and taking off regular upper body clothing?: A Little Help from another person to put on  and taking off regular lower body clothing?: A Lot 6 Click Score: 17   End of Session Equipment Utilized During Treatment: Rolling walker (2 wheels)  Activity Tolerance: Patient tolerated treatment well Patient left: in chair;with call bell/phone within reach;with chair alarm set  OT Visit Diagnosis: Unsteadiness on feet (R26.81)                Time: 8893-8881 OT Time Calculation (min): 12 min Charges:  OT General Charges $OT Visit: 1 Visit OT Evaluation $OT Eval Low Complexity: 1 Low  Elston Slot, M.S. OTR/L  01/15/24, 12:47 PM  ascom [phone]

## 2024-01-15 NOTE — Telephone Encounter (Signed)
 SOC has changed to [facility].  Auth Submission: NO AUTH NEEDED Site of care: Site of care: [facility] Payer: [employer] Medication & CPT/J Code(s) submitted: Feraheme  (ferumoxytol ) [id number] Diagnosis Code: d50.9 Route of submission (phone, fax, portal):  Phone # Fax # Auth type: Buy/Bill HB Units/visits requested: 2 Reference number:  Approval from: 01/16/24 to 05/05/24

## 2024-01-15 NOTE — Telephone Encounter (Signed)
 Received telephone call from patient's daughter, [name].  [name] states patient is currently in the hospital at [facility] and will be d/c to rehab for therapy.   [name] stated she read over the doctor's notes from the hospital and it appears as though the patient was being persuaded to have a procedure done to her legs that would require the patient to stop taking the Bevacizumab  medication.  [name] wanted to make Dr. Lanny aware that Patient is not going to have anything done to her legs b/c she wants to continue taking the Bevacizumab  medication.   [name] also inquired patient's appt for iron  infusion.  I let [name] know that the orders for IVF had been put in and provided her w/ the T# [phone] to follow up.

## 2024-01-15 NOTE — TOC Progression Note (Signed)
 Transition of Care [facility]) - Progression Note    Patient Details  Name: [name] MRN: [id number] Date of Birth: [birth year]  Transition of Care [facility]) CM/SW Contact  Alvaro Louder, [place] Phone Number: 01/15/2024, 9:20 AM  Clinical Narrative:   ISRAEL Faxed out information to SNF's in [place]. LCSWA will present Facilities to patient at the bedside.  TOC to follow for discharge                       Expected Discharge Plan and Services                                               Social Drivers of Health (SDOH) Interventions SDOH Screenings   Food Insecurity: No Food Insecurity (01/09/2024)  Housing: Low Risk  (01/09/2024)  Transportation Needs: No Transportation Needs (01/09/2024)  Utilities: Not At Risk (01/09/2024)  Alcohol Screen: Low Risk  (11/06/2023)  Depression (PHQ2-9): Low Risk  (12/08/2023)  Financial Resource Strain: Low Risk  (11/06/2023)  Physical Activity: Insufficiently Active (11/06/2023)  Social Connections: Socially Isolated (01/09/2024)  Stress: No Stress Concern Present (11/06/2023)  Tobacco Use: Medium Risk (01/09/2024)  Health Literacy: Adequate Health Literacy (11/06/2023)    Readmission Risk Interventions    06/17/2023   10:07 AM 01/31/2023    3:53 PM 11/09/2022   12:00 PM  Readmission Risk Prevention Plan  Transportation Screening Complete Complete Complete  PCP or Specialist Appt within 3-5 Days   Complete  HRI or Home Care Consult  Complete Complete  Social Work Consult for Recovery Care Planning/Counseling   Complete  Palliative Care Screening  Not Applicable   Medication Review Oceanographer)  Complete Complete  HRI or Home Care Consult Complete    SW Recovery Care/Counseling Consult Complete    Palliative Care Screening Not Applicable    Skilled Nursing Facility Not Applicable

## 2024-01-15 NOTE — Plan of Care (Signed)
  Problem: Education: Goal: Knowledge of General Education information will improve Description: Including pain rating scale, medication(s)/side effects and non-pharmacologic comfort measures Outcome: Progressing   Problem: Clinical Measurements: Goal: Diagnostic test results will improve Outcome: Progressing   Problem: Nutrition: Goal: Adequate nutrition will be maintained Outcome: Progressing   Problem: Coping: Goal: Level of anxiety will decrease Outcome: Progressing   Problem: Pain Managment: Goal: General experience of comfort will improve and/or be controlled Outcome: Progressing

## 2024-01-15 NOTE — Plan of Care (Signed)
  Problem: Clinical Measurements: Goal: Respiratory complications will improve Outcome: Progressing Goal: Cardiovascular complication will be avoided Outcome: Progressing   Problem: Activity: Goal: Risk for activity intolerance will decrease Outcome: Progressing   Problem: Nutrition: Goal: Adequate nutrition will be maintained Outcome: Progressing   Problem: Coping: Goal: Level of anxiety will decrease Outcome: Progressing   Problem: Elimination: Goal: Will not experience complications related to bowel motility Outcome: Progressing

## 2024-01-16 ENCOUNTER — Telehealth: Payer: Self-pay | Admitting: Student

## 2024-01-16 DIAGNOSIS — S81802A Unspecified open wound, left lower leg, initial encounter: Secondary | ICD-10-CM | POA: Diagnosis not present

## 2024-01-16 LAB — CBC
HCT: 32.1 % — ABNORMAL LOW (ref 36.0–46.0)
Hemoglobin: 9.6 g/dL — ABNORMAL LOW (ref 12.0–15.0)
MCH: 24.1 pg — ABNORMAL LOW (ref 26.0–34.0)
MCHC: 29.9 g/dL — ABNORMAL LOW (ref 30.0–36.0)
MCV: 80.5 fL (ref 80.0–100.0)
Platelets: 159 K/uL (ref 150–400)
RBC: 3.99 MIL/uL (ref 3.87–5.11)
RDW: 19.8 % — ABNORMAL HIGH (ref 11.5–15.5)
WBC: 3.4 K/uL — ABNORMAL LOW (ref 4.0–10.5)
nRBC: 0 % (ref 0.0–0.2)

## 2024-01-16 LAB — BASIC METABOLIC PANEL WITH GFR
Anion gap: 11 (ref 5–15)
BUN: 62 mg/dL — ABNORMAL HIGH (ref 8–23)
CO2: 32 mmol/L (ref 22–32)
Calcium: 9.1 mg/dL (ref 8.9–10.3)
Chloride: 92 mmol/L — ABNORMAL LOW (ref 98–111)
Creatinine, Ser: 2.41 mg/dL — ABNORMAL HIGH (ref 0.44–1.00)
GFR, Estimated: 19 mL/min — ABNORMAL LOW (ref >60)
Glucose, Bld: 107 mg/dL — ABNORMAL HIGH (ref 70–99)
Potassium: 4.4 mmol/L (ref 3.5–5.1)
Sodium: 135 mmol/L (ref 135–145)

## 2024-01-16 LAB — MAGNESIUM: Magnesium: 2.2 mg/dL (ref 1.7–2.4)

## 2024-01-16 MED ORDER — SODIUM CHLORIDE 0.9 % IV SOLN: INTRAVENOUS | Status: AC

## 2024-01-16 NOTE — Telephone Encounter (Signed)
 The patient's daughter called the after-hours answering service that she was concerned that her mother's Lasix  was being held while she was in the hospital.  Informed the patient's daughter that she could request to be seen by cardiology at the hospital if there is concerns that it is best to do that through [facility]  Informed the patient's daughter that the patient is being closely monitored in the hospital.  The patient has an outpatient follow-up on 01/26/2024.  The patient's daughter will try and call the [facility] clinic when they reopen on Monday.  Signed,  Morse Clause, PA-C 01/16/2024, 7:32 PM

## 2024-01-16 NOTE — Progress Notes (Signed)
 PROGRESS NOTE    [name]  FMW:[id number] DOB: Sep 20, 1933 DOA: 01/09/2024 PCP: [name], MD  132A/132A-AA  LOS: 7 days   Brief hospital course:   Assessment & Plan: [name] is a 89 y.o. 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 Patient has 2 lower extremity ulcers, poor healing.  Has been on antibiotics, cultures growing MRSA and Corynebacterium, was sent to the hospital for IV antibiotics.    Infected left lower extremity wound with surrounding cellulitis - Patient has outpatient culture apparently growing MRSA and corynebacterium - XR left tibia/fibula large soft tissue defect within the distal left lower leg unchanged, persistent diffuse subcutaneous edema.  No acute/destructive bony abnormalities - US  negative for DVT bilaterally, US  ABI right ABI 0.78, left could not be evaluated - CT with no bony destruction - Continue linezolid  x 7 days, discontinue cefepime , Seen by ID - Follow-up blood cultures - Seen by WOC, recommend debridement - Seen by vascular surgery, intraoperative debridement would be appropriate, unclear etiology - Discussed with oncology, will not be able to do debridement for 4 to 6 weeks as patient has been on Avastin .  If there is plan for intervention, we will have to hold Avastin  and anemia will get worse - Seen by orthopedics, appreciate recs.  No signs of bone involvement - Seen by ID, appreciate recs --emphasized  again with pt that wounds will not heal without it being covered and kept moist. --cont Linezolid  --wound care per order   Hypokalemia - resolved --cont potassium suppl scheduled --supplement PRN   AKI on CKD 3b/IV - Cr improved s/p IV fluids, Baseline Cr 1.7-1.9 --Cr went up to 2.41 today. --hold home torsemide  and metolazone    Normocytic anemia - Monitor Hb   Chronic diastolic heart failure Hypertrophic cardiomyopathy Status post ICD placement.   Pulmonary HTN --hold home torsemide  and metolazone   --consult cardio tomorrow   COPD with chronic hypoxic respiratory failure On home oxygen  2L [place] --stable   Hx pancreatic neuroendocrine cancer Patient is on chemotherapy.   Continue follow-up with oncologist outpatient   Hyperlipidemia:  --cont statin   CAD:  --cont statin   Peripheral vascular disease:  --cont statin  Hyponatremia --has hx of intermittent hyponatremia --monitor   DVT prophylaxis: Lovenox  SQ Code Status: DNR  Family Communication: daughter updated on the phone today Level of care: Med-Surg Dispo:   The patient is from: home Anticipated d/c is to: SNF rehab Anticipated d/c date is: 1-2 days   Subjective and Interval History:  Cr went up today.  Pt admitted to not drinking as much fluids in the hospital.  Home torsemide  and metolazone  held.  Daughter was concerned about holding diuretics and resulting in fluid buildup, therefore, will consult cardiology tomorrow.   Objective: Vitals:   01/16/24 0510 01/16/24 0845 01/16/24 1643 01/16/24 1931  BP: (!) 123/48 (!) 131/57 127/67 (!) 142/49  Pulse: 60 61 67 (!) 59  Resp: 15 16 18 16   Temp: 98.4 F (36.9 C) 98.4 F (36.9 C) 98 F (36.7 C) 97.6 F (36.4 C)  TempSrc:  Oral    SpO2: 100% 100% 100%  100%  Weight:      Height:        Intake/Output Summary (Last 24 hours) at 01/16/2024 2120 Last data filed at 01/16/2024 1900 Gross per 24 hour  Intake 372.22 ml  Output 800 ml  Net -427.78 ml    Filed Weights   01/09/24 2145  Weight: 57 kg    Examination:   Constitutional: NAD, AAOx3 HEENT: conjunctivae and lids normal, EOMI CV: No cyanosis.   RESP: normal respiratory effort, on RA Extremities: blackened and ulcerated wound over both legs, L>R Neuro: II - XII grossly intact.   Psych: Normal mood and affect.     Data Reviewed: I have personally reviewed labs and imaging studies  Time spent: 50 minutes  Ellouise Haber, MD [facility] If 7PM-7AM, please contact night-coverage 01/16/2024, 9:20 PM

## 2024-01-16 NOTE — TOC Progression Note (Signed)
 Transition of Care [facility]) - Progression Note    Patient Details  Name: [name] MRN: [id number] Date of Birth: [birth year]  Transition of Care [facility]) CM/SW Contact  Alvaro Louder, [place] Phone Number: 01/16/2024, 9:16 AM  Clinical Narrative:   ISRAEL started insurance auth with [employer] for the patient to admit to [facility] when medically ready. Awaiting decision.   TOC to follow for discharge                      Expected Discharge Plan and Services                                               Social Drivers of Health (SDOH) Interventions SDOH Screenings   Food Insecurity: No Food Insecurity (01/09/2024)  Housing: Low Risk  (01/09/2024)  Transportation Needs: No Transportation Needs (01/09/2024)  Utilities: Not At Risk (01/09/2024)  Alcohol Screen: Low Risk  (11/06/2023)  Depression (PHQ2-9): Low Risk  (12/08/2023)  Financial Resource Strain: Low Risk  (11/06/2023)  Physical Activity: Insufficiently Active (11/06/2023)  Social Connections: Socially Isolated (01/09/2024)  Stress: No Stress Concern Present (11/06/2023)  Tobacco Use: Medium Risk (01/09/2024)  Health Literacy: Adequate Health Literacy (11/06/2023)    Readmission Risk Interventions    06/17/2023   10:07 AM 01/31/2023    3:53 PM 11/09/2022   12:00 PM  Readmission Risk Prevention Plan  Transportation Screening Complete Complete Complete  PCP or Specialist Appt within 3-5 Days   Complete  HRI or Home Care Consult  Complete Complete  Social Work Consult for Recovery Care Planning/Counseling   Complete  Palliative Care Screening  Not Applicable   Medication Review Oceanographer)  Complete Complete  HRI or Home Care Consult Complete    SW Recovery Care/Counseling Consult Complete    Palliative Care Screening Not Applicable    Skilled Nursing Facility Not Applicable

## 2024-01-16 NOTE — Progress Notes (Signed)
 Physical Therapy Treatment Patient Details Name: [name] MRN: [id number] DOB: 1933/12/05 Today's Date: 01/16/2024   History of Present Illness Pt is a 89 y/o F presenting to ED with c/o MRSA in LLE, BLE wounds. Imaging negative for DVT, osteomyelitis. PMH significant for PVD, pancreatic CA on chemotherapy, HRpEF, CKD-III, hx of nephrectomy, squamous cell carcinoma, HLD, pacemaker, COPD with chronic O2 use at home.    PT Comments  Patient alert, with family at bedside, did report significant leg pain, session modified accordingly and RN notified. The patient was able to transition to sitting EOB with extra time, verbal/visual cues and redirection to task, CGA. Sit <> stand after sitting for several minutes due to pain/dizziness (dizziness improved over time), CGA with RW and able to step pivot to recliner in room, CGA. Left with all needs in reach. The patient would benefit from further skilled PT intervention to continue to progress towards goals.    If plan is discharge home, recommend the following: A lot of help with walking and/or transfers;A lot of help with bathing/dressing/bathroom;Assistance with cooking/housework;Direct supervision/assist for medications management;Assist for transportation;Help with stairs or ramp for entrance   Can travel by private vehicle     No  Equipment Recommendations  Other (comment) (TBD)    Recommendations for Other Services OT consult     Precautions / Restrictions Precautions Precautions: Fall;ICD/Pacemaker Recall of Precautions/Restrictions: Impaired Precaution/Restrictions Comments: pacemaker, BLE wounds Restrictions Weight Bearing Restrictions Per Provider Order: No     Mobility  Bed Mobility   Bed Mobility: Supine to Sit     Supine to sit: Contact guard, HOB elevated, Used rails          Transfers Overall transfer level: Needs assistance Equipment used: Rolling walker (2 wheels) Transfers: Sit to/from Stand Sit to  Stand: Contact guard assist                Ambulation/Gait               General Gait Details: limited due to pain today   Stairs             Wheelchair Mobility     Tilt Bed    Modified Rankin (Stroke Patients Only)       Balance Overall balance assessment: Needs assistance Sitting-balance support: Feet supported Sitting balance-Leahy Scale: Good     Standing balance support: During functional activity, Bilateral upper extremity supported Standing balance-Leahy Scale: Fair                              Communication    Cognition Arousal: Alert Behavior During Therapy: Anxious                           PT - Cognition Comments: A&Ox4,very focused on BLE pain RN notified Following commands: Impaired Following commands impaired: Follows one step commands with increased time    Cueing Cueing Techniques: Verbal cues, Visual cues, Gestural cues  Exercises      General Comments        Pertinent Vitals/Pain Pain Assessment Pain Assessment: 0-10 Pain Score: 8  Pain Location: BLE Pain Descriptors / Indicators: Grimacing Pain Intervention(s): Limited activity within patient's tolerance, Monitored during session, Repositioned    Home Living                          Prior Function  PT Goals (current goals can now be found in the care plan section) Progress towards PT goals: Progressing toward goals    Frequency    Min 2X/week      PT Plan      Co-evaluation              AM-PAC PT 6 Clicks Mobility   Outcome Measure  Help needed turning from your back to your side while in a flat bed without using bedrails?: A Little Help needed moving from lying on your back to sitting on the side of a flat bed without using bedrails?: A Little Help needed moving to and from a bed to a chair (including a wheelchair)?: A Little Help needed standing up from a chair using your arms (e.g., wheelchair  or bedside chair)?: A Little Help needed to walk in hospital room?: A Lot Help needed climbing 3-5 steps with a railing? : A Lot 6 Click Score: 16    End of Session Equipment Utilized During Treatment: Oxygen  Activity Tolerance: Patient limited by pain Patient left: in chair;with call bell/phone within reach;with chair alarm set Nurse Communication: Mobility status PT Visit Diagnosis: Unsteadiness on feet (R26.81);Other abnormalities of gait and mobility (R26.89);Muscle weakness (generalized) (M62.81);Difficulty in walking, not elsewhere classified (R26.2);Pain Pain - Right/Left:  (bilateral) Pain - part of body: Leg     Time: 8471-8452 PT Time Calculation (min) (ACUTE ONLY): 19 min  Charges:    $Therapeutic Activity: 8-22 mins PT General Charges $$ ACUTE PT VISIT: 1 Visit                     Doyal Shams PT, DPT 3:54 PM,01/16/24

## 2024-01-16 NOTE — Plan of Care (Signed)
°  Problem: Education: Goal: Knowledge of General Education information will improve Description: Including pain rating scale, medication(s)/side effects and non-pharmacologic comfort measures Outcome: Progressing   Problem: Clinical Measurements: Goal: Diagnostic test results will improve Outcome: Progressing   Problem: Nutrition: Goal: Adequate nutrition will be maintained Outcome: Progressing   Problem: Coping: Goal: Level of anxiety will decrease Outcome: Progressing

## 2024-01-16 NOTE — TOC Progression Note (Signed)
 Transition of Care [facility]) - Progression Note    Patient Details  Name: [name] MRN: [id number] Date of Birth: [birth year]  Transition of Care [facility]) CM/SW Contact  [place] Du[place], [place] Phone Number: 01/16/2024, 5:36 PM  Clinical Narrative:     Call received from [facility], [facility]. SNF approval pending. CSW advised that patient needs to allow wound care. This is affecting approval. Ambulance approval received [phone].                     Expected Discharge Plan and Services                                               Social Drivers of Health (SDOH) Interventions SDOH Screenings   Food Insecurity: No Food Insecurity (01/09/2024)  Housing: Low Risk  (01/09/2024)  Transportation Needs: No Transportation Needs (01/09/2024)  Utilities: Not At Risk (01/09/2024)  Alcohol Screen: Low Risk  (11/06/2023)  Depression (PHQ2-9): Low Risk  (12/08/2023)  Financial Resource Strain: Low Risk  (11/06/2023)  Physical Activity: Insufficiently Active (11/06/2023)  Social Connections: Socially Isolated (01/09/2024)  Stress: No Stress Concern Present (11/06/2023)  Tobacco Use: Medium Risk (01/09/2024)  Health Literacy: Adequate Health Literacy (11/06/2023)    Readmission Risk Interventions    06/17/2023   10:07 AM 01/31/2023    3:53 PM 11/09/2022   12:00 PM  Readmission Risk Prevention Plan  Transportation Screening Complete Complete Complete  PCP or Specialist Appt within 3-5 Days   Complete  HRI or Home Care Consult  Complete Complete  Social Work Consult for Recovery Care Planning/Counseling   Complete  Palliative Care Screening  Not Applicable   Medication Review [employer])  Complete Complete  HRI or Home Care Consult Complete    SW Recovery Care/Counseling Consult Complete    Palliative Care Screening Not Applicable    Skilled Nursing Facility Not Applicable

## 2024-01-17 DIAGNOSIS — N179 Acute kidney failure, unspecified: Secondary | ICD-10-CM

## 2024-01-17 DIAGNOSIS — I5032 Chronic diastolic (congestive) heart failure: Secondary | ICD-10-CM | POA: Diagnosis not present

## 2024-01-17 DIAGNOSIS — I4821 Permanent atrial fibrillation: Secondary | ICD-10-CM

## 2024-01-17 DIAGNOSIS — I272 Pulmonary hypertension, unspecified: Secondary | ICD-10-CM | POA: Diagnosis not present

## 2024-01-17 DIAGNOSIS — N184 Chronic kidney disease, stage 4 (severe): Secondary | ICD-10-CM | POA: Diagnosis not present

## 2024-01-17 DIAGNOSIS — S81802A Unspecified open wound, left lower leg, initial encounter: Secondary | ICD-10-CM | POA: Diagnosis not present

## 2024-01-17 LAB — BASIC METABOLIC PANEL WITH GFR
Anion gap: 9 (ref 5–15)
BUN: 60 mg/dL — ABNORMAL HIGH (ref 8–23)
CO2: 28 mmol/L (ref 22–32)
Calcium: 8.8 mg/dL — ABNORMAL LOW (ref 8.9–10.3)
Chloride: 97 mmol/L — ABNORMAL LOW (ref 98–111)
Creatinine, Ser: 2.3 mg/dL — ABNORMAL HIGH (ref 0.44–1.00)
GFR, Estimated: 20 mL/min — ABNORMAL LOW (ref >60)
Glucose, Bld: 99 mg/dL (ref 70–99)
Potassium: 4.4 mmol/L (ref 3.5–5.1)
Sodium: 134 mmol/L — ABNORMAL LOW (ref 135–145)

## 2024-01-17 LAB — VITAMIN C: Vitamin C: 0.6 mg/dL (ref 0.4–2.0)

## 2024-01-17 MED ORDER — MAGIC MOUTHWASH W/LIDOCAINE
10.0000 mL | Freq: Three times a day (TID) | ORAL | Status: DC | PRN
  Administered 2024-01-17: 5 mL via ORAL
  Filled 2024-01-17 (×2): qty 10

## 2024-01-17 NOTE — Plan of Care (Signed)
  Problem: Nutrition: Goal: Adequate nutrition will be maintained Outcome: Progressing   Problem: Coping: Goal: Level of anxiety will decrease Outcome: Progressing   Problem: Pain Managment: Goal: General experience of comfort will improve and/or be controlled Outcome: Progressing

## 2024-01-17 NOTE — Consult Note (Signed)
 Cardiology Consultation   Patient ID: [name] MRN: [id number]; DOB: Nov 02, 1933  Admit date: 01/09/2024 Date of Consult: 01/17/2024  PCP:  [name] Baller, MD   [facility] Providers Cardiologist:  Evalene Lunger, MD  Electrophysiologist:  Elspeth Sage, MD  Advanced Heart Failure:  Ezra Shuck, MD       Patient Profile: [name] is a 89 y.o. 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 Awanda.  History of Present Illness: [name] 's daughter called the on call team yesterday regarding the fact that patient's lasix  were on hold. Discussed with Dr. Awanda, patient's daughter requesting cardiology evaluate patient.  Extensive recent history reviewed. I met patient in 09/2023 when she was hospitalized for sepsis, and she was most recently seen by Dr. Shuck on 10/02/23 in the clinic. At that time, her torsemide  and metolazone  regimen was continued. It was noted that SGLT2is were not an option due to UTI. Not a candidate for pulmonary vasodilators as her pressures are due to pulmonary venous hypertension. He again discussed goals of care, noted that patient did not want to consider hospice, Dr. Shuck concerned about frequent readmission.  She has had issues with chronic lower extremity infections/weeping, has been followed closely. She was called 9/4 after wound swab grew MRSA and corynebacterium, and X ray was concerning for soft tissue gas. Recommended to present to ER, which she did on 9/5. She was admitted for treatment of her wound infection. Infectious disease, orthopedic surgery, and  vascular surgery all consulted on patient, no need for orthopedic intervention as no osteomyelitis, not felt to be vascular in etiology but debridement offered, patient declined. Recommended to biopsy to rule out SCC but could not be done as she is on bevacizumab . Ultimately managed with antibiotics, initially cefepime  and linezolid , then linezolid  alone.   As part of her hospital course, she has had fluctuating kidney function. Cr on presentation was 1.83, peak this admission 2.41, today 2.30. She was receiving her home torsemide  100 mg BID and metolazone  2.5 mg three times weekly, but this was held on 9/12 when her renal function worsened to 2.41. Improved today with holding diuretics. Admission weight was 57 kg, no weights since, documented as 6L negative but intake not well charted.  She notes that her breathing feels ok today. We reviewed reasons diuretic has been held.  Past Medical History:  Diagnosis Date   (HFpEF) heart failure with preserved ejection fraction (HCC)    EF=60-60%   Actinic keratosis 01/17/2015   R forearm   Adjustment disorder with anxiety    Adverse effect of other narcotics, sequela    Intolerance to all narcotics   Anti-Duffy antibodies present    Aortic valve stenosis    Arthritis    some in my hands (11/11/2012)   Atrial fibrillation, permanent (HCC)    Eliquis    Atypical mole 03/25/2018   L forearm - severe   Automatic implantable cardioverter-defibrillator in situ    Avascular necrosis of hip (HCC) 05/03/2011   Carotid artery stenosis 09/2007   a. 09/2007: 60-79% bilateral (stable); b. 10/2008: 40-59% R 60-79%    Cellulitis of left lower extremity 07/13/2020  CKD (chronic kidney disease), stage III (HCC)    COPD (chronic obstructive pulmonary disease) (HCC)    Coronary artery disease    non-obstructive by 2006 cath   COVID-19 virus infection 02/12/2022   Displaced fracture of left femoral neck (HCC) 10/09/2018   GI bleed 03/28/2020   AVM   High  cholesterol    Hypertension 05/20/2011   Hypertr obst cardiomyop    Hypotension, unspecified    cardiac cath 2006..nonobstructive CAD 30-40s lesions.SABRAETT 1/09 nondiagnostic due to poor HR response..Right Renal Cancer 2003   Iron  deficiency anemia    Long term (current) use of anticoagulants    On home oxygen  therapy    3L [place]   Osteoarthritis of right hip    PAD (peripheral artery disease) (HCC)    Pancreatic cancer (HCC)    sandostatin    PONV (postoperative nausea and vomiting)    Presence of permanent cardiac pacemaker    Pulmonary HTN (HCC)    RECTAL BLEEDING 10/13/2009   Qualifier: Diagnosis of  By: Kerman NP, Paula     Red blood cell antibody positive with compatible PRBC difficult to obtain    Anti FYA (Duffy a) antibody. Must be transfused with PRB which are Duffy Antigen Negative and Crossmatch Compatible   Renal cell carcinoma (HCC) 2005   s/p right nephrectomy   Squamous cell carcinoma of skin 01/17/2015   R lat wrist   Squamous cell carcinoma of skin 01/29/2018   R post upper leg - superficially invasive   Squamous cell carcinoma of skin 03/25/2018   L lat foot   Squamous cell carcinoma of skin 11/09/2020   left lat foot - EDC 01/01/21, recurrent 01/31/21 - MOHs 02/22/21   Squamous cell carcinoma of skin 12/19/2021   Right Posterior Medial Thigh, EDC   Squamous cell carcinoma of skin 12/19/2021   SCC IS, L lat heel, EDC 02/04/2022   Squamous cell carcinoma of skin 01/21/2022   L forearm, EDC 02/04/2022   Squamous cell carcinoma of skin 01/21/2022   R lower leg below knee, EDC 02/04/2022   Squamous cell carcinoma of skin 01/21/2022   SCCIS, R post heel, EDC 02/04/2022   Squamous cell carcinoma of skin 07/01/2022   left lower abdomen, in situ, EDC   Squamous cell carcinoma of skin 07/01/2022   left medial chest, EDC   Urge incontinence    Venous stasis of both lower extremities     Past Surgical History:  Procedure Laterality Date   ABDOMINAL AORTOGRAM  W/LOWER EXTREMITY N/A 02/12/2021   Procedure: ABDOMINAL AORTOGRAM W/LOWER EXTREMITY;  Surgeon: Sheree Penne Bruckner, MD;  Location: [facility] INVASIVE CV LAB;  Service: Cardiovascular;  Laterality: N/A;   ABDOMINAL HYSTERECTOMY  1975   for benign causes   APPENDECTOMY     BI-VENTRICULAR PACEMAKER UPGRADE  05/04/2010   BIOPSY  02/28/2019   Procedure: BIOPSY;  Surgeon: Teressa Toribio SQUIBB, MD;  Location: [facility] ENDOSCOPY;  Service: Endoscopy;;   BIOPSY  03/04/2022   Procedure: BIOPSY;  Surgeon: Wilhelmenia Aloha Raddle., MD;  Location: [facility];  Service: Gastroenterology;;   CARDIAC CATHETERIZATION  2006   CARDIOVERSION N/A 02/20/2018   Procedure: CARDIOVERSION;  Surgeon: [name], MD;  Location: [facility] ORS;  Service: Cardiovascular;  Laterality: N/A;   CARDIOVERSION N/A 03/27/2018   Procedure: CARDIOVERSION;  Surgeon: [name], MD;  Location: [facility] ORS;  Service: Cardiovascular;  Laterality: N/A;   CATARACT EXTRACTION W/ INTRAOCULAR LENS  IMPLANT, BILATERAL  01/2006-02-2006   CHOLECYSTECTOMY N/A 11/11/2012  Procedure: LAPAROSCOPIC CHOLECYSTECTOMY WITH INTRAOPERATIVE CHOLANGIOGRAM;  Surgeon: Donnice POUR. Belinda, MD;  Location: [facility] OR;  Service: General;  Laterality: N/A;   COLONOSCOPY WITH PROPOFOL  N/A 02/28/2019   Procedure: COLONOSCOPY WITH PROPOFOL ;  Surgeon: Teressa Toribio SQUIBB, MD;  Location: [facility] ENDOSCOPY;  Service: Endoscopy;  Laterality: N/A;   ENTEROSCOPY N/A 03/30/2020   Procedure: ENTEROSCOPY;  Surgeon: San Sandor GAILS, DO;  Location: [facility] ENDOSCOPY;  Service: Gastroenterology;  Laterality: N/A;   ENTEROSCOPY N/A 02/10/2021   Procedure: ENTEROSCOPY;  Surgeon: Rollin Dover, MD;  Location: [facility] ENDOSCOPY;  Service: Endoscopy;  Laterality: N/A;   ENTEROSCOPY N/A 02/15/2022   Procedure: ENTEROSCOPY;  Surgeon: Federico Rosario BROCKS, MD;  Location: [facility] ENDOSCOPY;  Service: Gastroenterology;  Laterality: N/A;   ENTEROSCOPY N/A 11/06/2022   Procedure: ENTEROSCOPY;  Surgeon: Wilhelmenia Aloha Raddle., MD;  Location: [facility] ENDOSCOPY;  Service: Gastroenterology;  Laterality: N/A;   EP IMPLANTABLE DEVICE N/A 02/21/2016   Procedure: ICD Generator Changeout;  Surgeon: Elspeth BROCKS Sage, MD;  Location: [facility] INVASIVE CV LAB;  Service: Cardiovascular;  Laterality: N/A;   ESOPHAGOGASTRODUODENOSCOPY N/A 03/04/2022   Procedure: ESOPHAGOGASTRODUODENOSCOPY (EGD);  Surgeon: Wilhelmenia Aloha Raddle., MD;  Location: [facility];  Service: Gastroenterology;  Laterality: N/A;   ESOPHAGOGASTRODUODENOSCOPY (EGD) WITH PROPOFOL  N/A 02/28/2019   Procedure: ESOPHAGOGASTRODUODENOSCOPY (EGD) WITH PROPOFOL ;  Surgeon: Teressa Toribio SQUIBB, MD;  Location: [facility] ENDOSCOPY;  Service: Endoscopy;  Laterality: N/A;   EUS N/A 03/04/2022   Procedure: UPPER ENDOSCOPIC ULTRASOUND (EUS) RADIAL;  Surgeon: Wilhelmenia Aloha Raddle., MD;  Location: [facility] ENDOSCOPY;  Service: Gastroenterology;  Laterality: N/A;   FINE NEEDLE ASPIRATION N/A 03/04/2022   Procedure: FINE NEEDLE ASPIRATION (FNA) LINEAR;  Surgeon: Wilhelmenia Aloha Raddle., MD;  Location: [facility] ENDOSCOPY;  Service: Gastroenterology;  Laterality: N/A;   GIVENS CAPSULE STUDY N/A 03/15/2019   Procedure: GIVENS CAPSULE STUDY;  Surgeon: Eda Iha, MD;  Location: [facility] ENDOSCOPY;  Service: Gastroenterology;  Laterality: N/A;   HOT HEMOSTASIS N/A 03/30/2020   Procedure: HOT HEMOSTASIS (ARGON PLASMA COAGULATION/BICAP);  Surgeon: San Sandor GAILS, DO;  Location: [facility] ENDOSCOPY;  Service: Gastroenterology;  Laterality: N/A;   HOT HEMOSTASIS N/A 02/10/2021   Procedure: HOT HEMOSTASIS (ARGON PLASMA COAGULATION/BICAP);  Surgeon: Rollin Dover, MD;  Location: [facility] ENDOSCOPY;  Service: Endoscopy;  Laterality: N/A;   HOT HEMOSTASIS N/A 02/15/2022   Procedure: HOT HEMOSTASIS (ARGON PLASMA COAGULATION/BICAP);  Surgeon: Federico Rosario BROCKS, MD;  Location: [facility] ENDOSCOPY;  Service: Gastroenterology;  Laterality: N/A;   HOT HEMOSTASIS N/A 11/06/2022   Procedure: HOT HEMOSTASIS (ARGON PLASMA COAGULATION/BICAP);   Surgeon: Wilhelmenia Aloha Raddle., MD;  Location: [facility] ENDOSCOPY;  Service: Gastroenterology;  Laterality: N/A;   INSERT / REPLACE / REMOVE PACEMAKER  05/01/2011   02-28-05-/05-04-10-ICD-MEDTRONIC MAXIMAL DR   IR ANGIOGRAM SELECTIVE EACH ADDITIONAL VESSEL  02/18/2022   IR ANGIOGRAM SELECTIVE EACH ADDITIONAL VESSEL  02/18/2022   IR ANGIOGRAM VISCERAL SELECTIVE  02/16/2022   IR ANGIOGRAM VISCERAL SELECTIVE  02/16/2022   IR EMBO ART  VEN HEMORR LYMPH EXTRAV  INC GUIDE ROADMAPPING  02/16/2022   IR THORACENTESIS ASP PLEURAL SPACE W/IMG GUIDE  02/21/2022   IR THORACENTESIS ASP PLEURAL SPACE W/IMG GUIDE  02/22/2022   IR US  GUIDE VASC ACCESS RIGHT  02/16/2022   JOINT REPLACEMENT     LAPAROSCOPIC CHOLECYSTECTOMY  11/11/2012   LAPAROSCOPIC LYSIS OF ADHESIONS N/A 11/11/2012   Procedure: LAPAROSCOPIC LYSIS OF ADHESIONS;  Surgeon: Donnice POUR. Belinda, MD;  Location: [facility] OR;  Service: General;  Laterality: N/A;   NEPHRECTOMY Right 06/2001    S/P RENAL CELL  CANCER   PERIPHERAL VASCULAR INTERVENTION Bilateral 02/12/2021   Procedure: PERIPHERAL VASCULAR INTERVENTION;  Surgeon: Sheree Penne Bruckner, MD;  Location: [facility] INVASIVE CV LAB;  Service: Cardiovascular;  Laterality: Bilateral;  Iliac artery stents   PRESSURE SENSOR/CARDIOMEMS N/A 02/03/2019   Procedure: PRESSURE SENSOR/CARDIOMEMS;  Surgeon: [name], MD;  Location: [facility] INVASIVE CV LAB;  Service: Cardiovascular;  Laterality: N/A;   RIGHT HEART CATH N/A 11/09/2018   Procedure: RIGHT HEART CATH;  Surgeon: [name], MD;  Location: [facility] INVASIVE CV LAB;  Service: Cardiovascular;  Laterality: N/A;   RIGHT HEART CATH N/A 03/08/2019   Procedure: RIGHT HEART CATH;  Surgeon: [name], MD;  Location: [facility] INVASIVE CV LAB;  Service: Cardiovascular;  Laterality: N/A;   SUBMUCOSAL TATTOO INJECTION  02/28/2019   Procedure: SUBMUCOSAL TATTOO INJECTION;  Surgeon: Teressa Toribio SQUIBB, MD;  Location: [facility] ENDOSCOPY;  Service: Endoscopy;;   SUBMUCOSAL TATTOO  INJECTION  11/06/2022   Procedure: SUBMUCOSAL TATTOO INJECTION;  Surgeon: Wilhelmenia Aloha Raddle., MD;  Location: [facility] ENDOSCOPY;  Service: Gastroenterology;;   TOTAL HIP ARTHROPLASTY Right 05/03/2011   Procedure: TOTAL HIP ARTHROPLASTY ANTERIOR APPROACH;  Surgeon: Bruckner CINDERELLA Poli;  Location: [facility] ORS;  Service: Orthopedics;  Laterality: Right;  Removal of Cannulated Screws Right Hip, Right Direct Anterior Hip Replacement   TOTAL HIP ARTHROPLASTY Left 10/09/2018   Procedure: TOTAL HIP ARTHROPLASTY ANTERIOR APPROACH;  Surgeon: Fidel Rogue, MD;  Location: [facility] OR;  Service: Orthopedics;  Laterality: Left;     Home Medications:  Prior to Admission medications   Medication Sig Start Date End Date Taking? Authorizing Provider  acetaminophen  (TYLENOL ) 500 MG tablet Take 1,000 mg by mouth every 8 (eight) hours.   Yes [provider]  Ascorbic Acid  (VITAMIN C  PO) Take 1 tablet by mouth daily.   Yes [provider]  Cholecalciferol  (VITAMIN D -3 PO) Take 2 capsules by mouth daily.   Yes [provider]  CRANBERRY PO Take 1 tablet by mouth daily.   Yes [provider]  Cyanocobalamin  (B-12 PO) Take 1 tablet by mouth daily.   Yes [provider]  ferrous gluconate  (FERGON) 324 MG tablet Take 1 tablet (324 mg total) by mouth daily with breakfast. 11/15/22  Yes Rai, Ripudeep K, MD  fluticasone  (FLONASE ) 50 MCG/ACT nasal spray Place 2 sprays into both nostrils daily as needed for allergies or rhinitis. 07/04/22  Yes [name] Baller, MD  metolazone  (ZAROXOLYN ) 2.5 MG tablet Take 1 tablet (2.5 mg total) by mouth 3 (three) times a week. Every Tues., Thurs and Sat. with 40 meq of potassium 07/25/23  Yes Stoner, Benjamin J, MD  Multiple Vitamins-Minerals (HAIR SKIN NAILS PO) Take 1 capsule by mouth daily.   Yes [provider]  Potassium Chloride  ER 20 MEQ TBCR Take 1 tablet (20 mEq total) by mouth daily. Patient taking differently: Take 20 mEq by mouth 2  (two) times daily. 07/24/23 01/09/24 Yes Zenaida Morene PARAS, MD  torsemide  (DEMADEX ) 20 MG tablet Take 5 tablets (100 mg total) by mouth 2 (two) times daily. 07/10/23  Yes [name], MD  traMADol  (ULTRAM ) 50 MG tablet Take 1 tablet (50 mg total) by mouth every 12 (twelve) hours as needed. 01/02/24  Yes [name] Baller, MD  clotrimazole -betamethasone  (LOTRISONE ) cream Apply topically 2 (two) times daily. Patient not taking: Reported on 01/09/2024 09/08/23   Von Bellis, MD  gabapentin  (NEURONTIN ) 100 MG capsule Take 1 capsule (100 mg total) by mouth at bedtime. Patient not taking: Reported on 01/09/2024 12/01/23  [name] Baller, MD  pantoprazole  (PROTONIX ) 40 MG tablet Take 1 tablet (40 mg total) by mouth daily. Patient not taking: Reported on 01/09/2024 11/09/22   Rai, Nydia POUR, MD  rosuvastatin  (CRESTOR ) 10 MG tablet Take 1 tablet (10 mg total) by mouth daily. Patient not taking: Reported on 12/01/2023 06/20/23   Maree Hue, MD  SANTYL 250 UNIT/GM ointment Apply topically daily. Patient not taking: Reported on 01/09/2024 10/15/23   [provider]    Scheduled Meds:  (feeding supplement) PROSource Plus  30 mL Oral TID BM   enoxaparin  (LOVENOX ) injection  30 mg Subcutaneous Q24H   feeding supplement  1 Container Oral TID BM   leptospermum manuka honey  1 Application Topical Daily   linezolid   600 mg Oral Q12H   multivitamin with minerals  1 tablet Oral Daily   potassium chloride  SA  20 mEq Oral BID   rosuvastatin   10 mg Oral Daily   zinc  sulfate (50mg  elemental zinc )  220 mg Oral Daily   Continuous Infusions:  PRN Meds: acetaminophen , ondansetron  **OR** ondansetron  (ZOFRAN ) IV, prochlorperazine , traMADol   Allergies:    Allergies  Allergen Reactions   Codeine Nausea And Vomiting and Other (See Comments)    Hallucinations, too- CANNOT HAVE; thinks she may have received Narcan  to reverse the effect   Dilaudid  [Hydromorphone ] Other (See Comments)    Excessive Somnolence-  Required Narcan  X2   Tape Other (See Comments)    CAN TOLERATE ONLY EASY-RELEASE, PAPER TAPE, AS THE SKIN IS VERY THIN AND WILL BRUISE AND TEAR VERY EASILY   Amoxil  [Amoxicillin ] Nausea Only   Asa [Aspirin ] Other (See Comments)    Told to avoid due to reduced kidney function, has 1 remaining kidney   Ms Contin  [Morphine ] Nausea And Vomiting and Other (See Comments)    Hallucinations, also   Neurontin  [Gabapentin ] Other (See Comments)    Out of it   Nsaids Other (See Comments)    Told to avoid due to reduced kidney function, has 1 remaining kidney    Social History:   Social History   Socioeconomic History   Marital status: Widowed    Spouse name: Not on file   Number of children: 2   Years of education: high school   Highest education level: Not on file  Occupational History   Occupation: Retired    Associate Professor: RETIRED  Tobacco Use   Smoking status: Former    Current packs/day: 0.00    Average packs/day: 0.5 packs/day for 40.0 years (20.0 ttl pk-yrs)    Types: Cigarettes    Start date: 05/06/1961    Quit date: 05/06/2001    Years since quitting: 22.7   Smokeless tobacco: Never  Vaping Use   Vaping status: Never Used  Substance and Sexual Activity   Alcohol use: No    Alcohol/week: 0.0 standard drinks of alcohol   Drug use: No   Sexual activity: Not Currently  Other Topics Concern   Not on file  Social History Narrative   Would desire CPR   07/11/20   From: the area   Living: with great-grandson - Norman (2003)   Work: retired - clerical      Family: 2 children - Devere and Nathanel - 2 grand chlidren - 5 great grand children      Enjoys: spending time with friends - watch move, eat out, spend time      Exercise: walking around the house   Diet: not good, limits red meat, limits salt, low appetite  Safety   Seat belts: Yes    Guns: Yes  and secure   Safe in relationships: Yes    Social Drivers of Corporate investment banker Strain: Low Risk  (11/06/2023)    Overall Financial Resource Strain (CARDIA)    Difficulty of Paying Living Expenses: Not hard at all  Food Insecurity: No Food Insecurity (01/09/2024)   Hunger Vital Sign    Worried About Running Out of Food in the Last Year: Never true    Ran Out of Food in the Last Year: Never true  Transportation Needs: No Transportation Needs (01/09/2024)   PRAPARE - Administrator, Civil Service (Medical): No    Lack of Transportation (Non-Medical): No  Physical Activity: Insufficiently Active (11/06/2023)   Exercise Vital Sign    Days of Exercise per Week: 3 days    Minutes of Exercise per Session: 20 min  Stress: No Stress Concern Present (11/06/2023)   [employer] of Occupational Health - Occupational Stress Questionnaire    Feeling of Stress: Not at all  Social Connections: Socially Isolated (01/09/2024)   Social Connection and Isolation Panel    Frequency of Communication with Friends and Family: More than three times a week    Frequency of Social Gatherings with Friends and Family: More than three times a week    Attends Religious Services: Never    Database administrator or Organizations: No    Attends Banker Meetings: Never    Marital Status: Widowed  Intimate Partner Violence: Not At Risk (01/09/2024)   Humiliation, Afraid, Rape, and Kick questionnaire    Fear of Current or Ex-Partner: No    Emotionally Abused: No    Physically Abused: No    Sexually Abused: No    Family History:    Family History  Problem Relation Age of Onset   Heart failure Mother    Early death Father        car accident   Breast cancer Maternal Aunt 25   Breast cancer Cousin    Breast cancer Other    Colon cancer Neg Hx    Esophageal cancer Neg Hx    Liver disease Neg Hx    Pancreatic cancer Neg Hx    Rectal cancer Neg Hx      ROS:  Please see the history of present illness.  All other ROS reviewed and negative.     Physical Exam/Data: Vitals:   01/16/24 1643 01/16/24 1931  01/17/24 0313 01/17/24 0716  BP: 127/67 (!) 142/49 (!) 134/58 (!) 129/50  Pulse: 67 (!) 59 (!) 59 60  Resp: 18 16 17 17   Temp: 98 F (36.7 C) 97.6 F (36.4 C) 98.7 F (37.1 C) 98.5 F (36.9 C)  TempSrc:    Oral  SpO2: 100% 100% 100% 100%  Weight:      Height:        Intake/Output Summary (Last 24 hours) at 01/17/2024 1210 Last data filed at 01/17/2024 0321 Gross per 24 hour  Intake 1232.65 ml  Output 800 ml  Net 432.65 ml      01/09/2024    9:45 PM 01/07/2024    2:34 PM 12/08/2023   12:18 PM  Last 3 Weights  Weight (lbs) 125 lb 11.2 oz 120 lb 3.2 oz --  Weight (kg) 57.017 kg 54.522 kg --     Body mass index is 20.29 kg/m.  General:  Frail, chronically ill appearing elderly woman, [place] in place HEENT: normal Neck:  TR pulsation to jaw noted but difficult to assess for sustained JVD point Vascular: Radial pulses 2+ bilaterally Cardiac:  normal S1, S2; somewhat irregular; 3/6 systolic murmur Lungs:  clear to auscultation bilaterally, no wheezing, rhonchi or rales  Abd: soft, nontender, no hepatomegaly  Ext: bilateral LE with wrinkling, brawny appearance of skin with multiple wounds Musculoskeletal:  No deformities Skin: warm and dry  Neuro:  no focal abnormalities noted Psych:  Normal affect   Laboratory Data: High Sensitivity Troponin:  No results for input(s): TROPONINIHS in the last 720 hours.   Chemistry Recent Labs  Lab 01/12/24 0504 01/16/24 0406 01/17/24 0451  NA 135 135 134*  K 4.4 4.4 4.4  CL 101 92* 97*  CO2 23 32 28  GLUCOSE 95 107* 99  BUN 47* 62* 60*  CREATININE 1.82* 2.41* 2.30*  CALCIUM  8.5* 9.1 8.8*  MG 2.1 2.2  --   GFRNONAA 27* 19* 20*  ANIONGAP 11 11 9     No results for input(s): PROT, ALBUMIN , AST, ALT, ALKPHOS, BILITOT in the last 168 hours. Lipids No results for input(s): CHOL, TRIG, HDL, LABVLDL, LDLCALC, CHOLHDL in the last 168 hours.  Hematology Recent Labs  Lab 01/11/24 0438 01/16/24 0406  WBC 4.2 3.4*   RBC 3.79* 3.99  HGB 9.1* 9.6*  HCT 31.0* 32.1*  MCV 81.8 80.5  MCH 24.0* 24.1*  MCHC 29.4* 29.9*  RDW 20.4* 19.8*  PLT 176 159   Thyroid  No results for input(s): TSH, FREET4 in the last 168 hours.  BNPNo results for input(s): BNP, PROBNP in the last 168 hours.  DDimer No results for input(s): DDIMER in the last 168 hours.  Radiology/Studies:  No results found.   Assessment and Plan: Chronic wound infections/cellulitis -reason for admission, managed by primary team, has seen ID, orthopedics, and vascular surgery this admission  Chronic diastolic heart failure Chronic PV failure Severe pulmonary venous hypertension Chronic kidney disease stage 4 Acute kidney injury -difficult balance, she will be prone to volume overload, but with acute kidney injury agree with holding torsemide  and metolazone  at this time -ordered daily weights as I/O appear inaccurate -as noted previously, she does not have any further treatment options from a cardiac standpoint. Hospice has been brought up, but she declines -will monitor her renal function and weights, restart diuretics once stable  Permanent atrial fibrillation -CHA2DS2/VAS Stroke Risk Points= 7 -not on anticoagulation due to GI bleeds -rate controlled on no nodal agents  Overall very difficult position. No long term options to improve cardiac conditions. She has multiple high risk conditions that complicate treatment, high complexity medical decision making  Risk Assessment/Risk Scores:       New York  Heart Association (NYHA) Functional Class NYHA Class IV  CHA2DS2-VASc Score = 6   This indicates a 9.7% annual risk of stroke. The patient's score is based upon: CHF History: 1 HTN History: 0 Diabetes History: 0 Stroke History: 2 Vascular Disease History: 0 Age Score: 2 Gender Score: 1        For questions or updates, please contact [facility] Please consult [email or web address] for contact info under       Signed, Shelda Bruckner, MD  01/17/2024 12:10 PM

## 2024-01-17 NOTE — Progress Notes (Addendum)
 PROGRESS NOTE    [name]  FMW:[id number] DOB: 1933/10/30 DOA: 01/09/2024 PCP: [name], MD  132A/132A-AA  LOS: 8 days   Brief hospital course:   Assessment & Plan: [name] is a 89 y.o. 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 Patient has 2 lower extremity ulcers, poor healing.  Has been on antibiotics, cultures growing MRSA and Corynebacterium, was sent to the hospital for IV antibiotics.    Infected left lower extremity wound with surrounding cellulitis - Patient has outpatient culture apparently growing MRSA and corynebacterium - XR left tibia/fibula large soft tissue defect within the distal left lower leg unchanged, persistent diffuse subcutaneous edema.  No acute/destructive bony abnormalities - US  negative for DVT bilaterally, US  ABI right ABI 0.78, left could not be evaluated - CT with no bony destruction - Continue linezolid  x 7 days, discontinue cefepime , Seen by ID - Follow-up blood cultures - Seen by WOC, recommend debridement - Seen by vascular surgery, intraoperative debridement would be appropriate, unclear etiology - Discussed with oncology, will not be able to do debridement for 4 to 6 weeks as patient has been on Avastin .  If there is plan for intervention, we will have to hold Avastin  and anemia will get worse - Seen by orthopedics, appreciate recs.  No signs of bone involvement - Seen by ID, appreciate recs --emphasized  again with pt that wounds will not heal without it being covered and kept moist. --cont Linezolid  --wound care order modified   Hypokalemia - resolved --cont potassium suppl scheduled --supplement PRN   AKI on CKD 3b - Cr improved s/p IV fluids, Baseline Cr 1.7-1.9 --Cr went up to 2.41 on 9/12.  home torsemide  and metolazone  held.  Ordered gentle MIVF.   --daughter is extremely concerned about holding diuretics and would like cardio to be involved. --consult cardio today to manage pt's diuretics  Normocytic anemia - Monitor Hb   Chronic diastolic heart failure Hypertrophic cardiomyopathy Status post ICD placement.   Pulmonary HTN --daughter is extremely concerned about holding diuretics and would like cardio to be involved. --consult cardio today to manage pt's diuretics   COPD with chronic hypoxic respiratory failure On home oxygen  2L [place] --stable   Hx pancreatic neuroendocrine cancer Patient is on chemotherapy.   Continue follow-up with oncologist outpatient   Hyperlipidemia:  --cont statin   CAD:  --cont statin   Peripheral vascular disease:  --cont statin  Hyponatremia --has hx of intermittent hyponatremia --monitor  Severe malnutrition in context of chronic illness  --supplements per dietician   DVT prophylaxis: Lovenox  SQ Code Status: DNR  Family Communication:  Level of care: Med-Surg Dispo:   The patient is from: home Anticipated d/c is to: SNF rehab Anticipated d/c date is: 2-3 days   Subjective and Interval History:  Peer-to-peer denied SNF rehab due to pt's non-compliance with dressing change.    RN came up with dressing change and covering that pt accepted today.  We were instructed to resubmit SNF rehab auth after pt can show consistent compliance with dressing change for a few days.   Objective: Vitals:   01/17/24 0313 01/17/24  9283 01/17/24 1617 01/17/24 1957  BP: (!) 134/58 (!) 129/50 (!) 124/51 (!) 127/56  Pulse: (!) 59 60 61 60   Resp: 17 17 16 19   Temp: 98.7 F (37.1 C) 98.5 F (36.9 C) 98.1 F (36.7 C) 98.5 F (36.9 C)  TempSrc:  Oral Oral   SpO2: 100% 100% 100% 100%  Weight:      Height:        Intake/Output Summary (Last 24 hours) at 01/17/2024 2008 Last data filed at 01/17/2024 0321 Gross per 24 hour  Intake 860.43 ml  Output 300 ml  Net 560.43 ml   Filed Weights   01/09/24 2145  Weight: 57 kg    Examination:   Constitutional: NAD, AAOx3 HEENT: conjunctivae and lids normal, EOMI CV: No cyanosis.   RESP: normal respiratory effort, on RA Extremities: dry, blackened skin and ulcerations over both legs, L>R Neuro: II - XII grossly intact.   Psych: Normal mood and affect.     Data Reviewed: I have personally reviewed labs and imaging studies  Time spent: 50 minutes  Ellouise Haber, MD [facility] If 7PM-7AM, please contact night-coverage 01/17/2024, 8:08 PM

## 2024-01-17 NOTE — TOC Progression Note (Signed)
 Transition of Care [facility]) - Progression Note    Patient Details  Name: [name] MRN: [id number] Date of Birth: 02/18/1934  [facility]) CM/SW Contact  Victory Jackquline RAMAN, RN Phone Number: 01/17/2024, 2:50 PM  Clinical Narrative:   Patient has been refusing wound care. MD talked to her this morning, and she is willing to try some other things, MD will check with wound care RN to figure out what else they can try. If patient agrees to new treatment MD would like for the authorization to be resubmitted for approval.                       Expected Discharge Plan and Services                                               Social Drivers of Health (SDOH) Interventions SDOH Screenings   Food Insecurity: No Food Insecurity (01/09/2024)  Housing: Low Risk  (01/09/2024)  Transportation Needs: No Transportation Needs (01/09/2024)  Utilities: Not At Risk (01/09/2024)  Alcohol Screen: Low Risk  (11/06/2023)  Depression (PHQ2-9): Low Risk  (12/08/2023)  Financial Resource Strain: Low Risk  (11/06/2023)  Physical Activity: Insufficiently Active (11/06/2023)  Social Connections: Socially Isolated (01/09/2024)  Stress: No Stress Concern Present (11/06/2023)  Tobacco Use: Medium Risk (01/09/2024)  Health Literacy: Adequate Health Literacy (11/06/2023)    Readmission Risk Interventions    06/17/2023   10:07 AM 01/31/2023    3:53 PM 11/09/2022   12:00 PM  Readmission Risk Prevention Plan  Transportation Screening Complete Complete Complete  PCP or Specialist Appt within 3-5 Days   Complete  HRI or Home Care Consult  Complete Complete  Social Work Consult for Recovery Care Planning/Counseling   Complete  Palliative Care Screening  Not Applicable   Medication Review Oceanographer)  Complete Complete  HRI or Home Care Consult Complete    SW Recovery Care/Counseling Consult Complete    Palliative Care Screening Not Applicable    Skilled Nursing Facility Not Applicable

## 2024-01-17 NOTE — Plan of Care (Signed)

## 2024-01-18 DIAGNOSIS — S81802A Unspecified open wound, left lower leg, initial encounter: Secondary | ICD-10-CM | POA: Diagnosis not present

## 2024-01-18 DIAGNOSIS — I5032 Chronic diastolic (congestive) heart failure: Secondary | ICD-10-CM | POA: Diagnosis not present

## 2024-01-18 DIAGNOSIS — I272 Pulmonary hypertension, unspecified: Secondary | ICD-10-CM | POA: Diagnosis not present

## 2024-01-18 LAB — BASIC METABOLIC PANEL WITH GFR
Anion gap: 10 (ref 5–15)
BUN: 60 mg/dL — ABNORMAL HIGH (ref 8–23)
CO2: 27 mmol/L (ref 22–32)
Calcium: 9.1 mg/dL (ref 8.9–10.3)
Chloride: 98 mmol/L (ref 98–111)
Creatinine, Ser: 2.14 mg/dL — ABNORMAL HIGH (ref 0.44–1.00)
GFR, Estimated: 22 mL/min — ABNORMAL LOW (ref >60)
Glucose, Bld: 95 mg/dL (ref 70–99)
Potassium: 4.8 mmol/L (ref 3.5–5.1)
Sodium: 135 mmol/L (ref 135–145)

## 2024-01-18 NOTE — Plan of Care (Signed)
   Problem: Health Behavior/Discharge Planning: Goal: Ability to manage health-related needs will improve Outcome: Progressing

## 2024-01-18 NOTE — Progress Notes (Signed)
 Patient reports that she will get dressings done before bed tonight. Provider made aware and will pass along to the oncoming nurse.

## 2024-01-18 NOTE — Plan of Care (Signed)
   Problem: Education: Goal: Knowledge of General Education information will improve Description: Including pain rating scale, medication(s)/side effects and non-pharmacologic comfort measures Outcome: Progressing   Problem: Clinical Measurements: Goal: Will remain free from infection Outcome: Progressing Goal: Diagnostic test results will improve Outcome: Progressing

## 2024-01-18 NOTE — Progress Notes (Signed)
 PROGRESS NOTE    [name]  FMW:[id number] DOB: 31-Dec-1933 DOA: 01/09/2024 PCP: [name], MD  132A/132A-AA  LOS: 9 days   Brief hospital course:   Assessment & Plan: [name] is a 89 y.o. 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 Patient has 2 lower extremity ulcers, poor healing.  Has been on antibiotics, cultures growing MRSA and Corynebacterium, was sent to the hospital for IV antibiotics.    Infected left lower extremity wound with surrounding cellulitis - Patient has outpatient culture apparently growing MRSA and corynebacterium - XR left tibia/fibula large soft tissue defect within the distal left lower leg unchanged, persistent diffuse subcutaneous edema.  No acute/destructive bony abnormalities - US  negative for DVT bilaterally, US  ABI right ABI 0.78, left could not be evaluated - CT with no bony destruction - Continue linezolid  x 7 days, discontinue cefepime , Seen by ID - Follow-up blood cultures - Seen by WOC, recommend debridement - Seen by vascular surgery, intraoperative debridement would be appropriate, unclear etiology - Discussed with oncology, will not be able to do debridement for 4 to 6 weeks as patient has been on Avastin .  If there is plan for intervention, we will have to hold Avastin  and anemia will get worse - Seen by orthopedics, appreciate recs.  No signs of bone involvement - Seen by ID, appreciate recs --emphasized  again with pt that wounds will not heal without it being covered and kept moist. --cont Linezolid  --modified dressing change per order (RN to ask to document compliance)   Hypokalemia - resolved --cont potassium suppl scheduled --supplement PRN   AKI on CKD 3b - Cr improved s/p IV fluids, Baseline Cr 1.7-1.9 --Cr went up to 2.41 on 9/12.  home torsemide  and metolazone  held.  Ordered gentle MIVF.   --daughter is extremely concerned about holding diuretics and would like cardio to be involved. --consulted cardio to manage pt's diuretics --hold diuretics, per cardio  Normocytic anemia - Monitor Hb   Chronic diastolic heart failure Hypertrophic cardiomyopathy Status post ICD placement.   Pulmonary HTN --daughter is extremely concerned about holding diuretics and would like cardio to be involved. --consulted cardio to manage pt's diuretics --hold diuretics, per cardio   COPD with chronic hypoxic respiratory failure On home oxygen  2L [place] --stable   Hx pancreatic neuroendocrine cancer Patient is on chemotherapy.   Continue follow-up with oncologist outpatient   Hyperlipidemia:  --cont statin   CAD:  --cont statin   Peripheral vascular disease:  --cont statin  Hyponatremia --has hx of intermittent hyponatremia --monitor  Severe malnutrition in context of chronic illness  --supplements per dietician  Permanent Afib --not on anticoagulation due to GI bleeds --rate controlled on no nodal agents   DVT prophylaxis: Lovenox  SQ Code Status: DNR  Family Communication:  Level of care: Med-Surg Dispo:   The patient is from: home Anticipated d/c is to: SNF rehab Anticipated d/c date is: need to document compliance with dressing change for a few days and then re-submit insurance auth for SNF rehab.   Subjective and Interval History:  Pt allowed modified dressing change, and said today that her legs  actually felt better covered.   Objective: Vitals:   01/18/24 0427  01/18/24 0600 01/18/24 0832 01/18/24 1557  BP: (!) 141/65  (!) 125/51 (!) 146/58  Pulse: 61  60 63  Resp: 18  16 19   Temp: (!) 97.4 F (36.3 C)  98.5 F (36.9 C) 98.7 F (37.1 C)  TempSrc:      SpO2: 90%  100% 100%  Weight:  58.2 kg    Height:        Intake/Output Summary (Last 24 hours) at 01/18/2024 1832 Last data filed at 01/18/2024 0427 Gross per 24 hour  Intake 100 ml  Output 600 ml  Net -500 ml   Filed Weights   01/09/24 2145 01/18/24 0600  Weight: 57 kg 58.2 kg    Examination:   Constitutional: NAD, AAOx3 HEENT: conjunctivae and lids normal, EOMI CV: No cyanosis.   RESP: normal respiratory effort, on RA Extremities: both lower legs loosely wrapped by gauze SKIN: warm, dry Neuro: II - XII grossly intact.   Psych: Normal mood and affect.     Data Reviewed: I have personally reviewed labs and imaging studies  Time spent: 50 minutes  Ellouise Haber, MD [facility] If 7PM-7AM, please contact night-coverage 01/18/2024, 6:32 PM

## 2024-01-18 NOTE — TOC Progression Note (Signed)
 Transition of Care [facility]) - Progression Note    Patient Details  Name: [name] MRN: [id number] Date of Birth: 11/21/1933  Transition of Care [facility]) CM/SW Contact  Victory Jackquline RAMAN, RN Phone Number: 01/18/2024, 10:06 AM  Clinical Narrative:   RNCM met with patient at the bedside   introduced role and explained that I was delivering the denial letter to her from her insurance company for admission to [facility] secondary to her not allowing RNCM will continue to follow discharge for planning/care coordination and update as applicable.  RNCM will continue to follow discharge for planning/care coordination and update as applicable.                Expected Discharge Plan and Services                                               Social Drivers of Health (SDOH) Interventions SDOH Screenings   Food Insecurity: No Food Insecurity (01/09/2024)  Housing: Low Risk  (01/09/2024)  Transportation Needs: No Transportation Needs (01/09/2024)  Utilities: Not At Risk (01/09/2024)  Alcohol Screen: Low Risk  (11/06/2023)  Depression (PHQ2-9): Low Risk  (12/08/2023)  Financial Resource Strain: Low Risk  (11/06/2023)  Physical Activity: Insufficiently Active (11/06/2023)  Social Connections: Socially Isolated (01/09/2024)  Stress: No Stress Concern Present (11/06/2023)  Tobacco Use: Medium Risk (01/09/2024)  Health Literacy: Adequate Health Literacy (11/06/2023)    Readmission Risk Interventions    06/17/2023   10:07 AM 01/31/2023    3:53 PM 11/09/2022   12:00 PM  Readmission Risk Prevention Plan  Transportation Screening Complete Complete Complete  PCP or Specialist Appt within 3-5 Days   Complete  HRI or Home Care Consult  Complete Complete  Social Work Consult for Recovery Care Planning/Counseling   Complete  Palliative Care Screening  Not Applicable   Medication Review Oceanographer)  Complete Complete  HRI or Home Care Consult Complete    SW Recovery Care/Counseling Consult  Complete    Palliative Care Screening Not Applicable    Skilled Nursing Facility Not Applicable

## 2024-01-18 NOTE — Progress Notes (Signed)
 Rounding Note    Patient Name: [name] Date of Encounter: 01/18/2024  [facility] Cardiologist: Evalene Lunger, MD   Subjective   Feels generally fatigued, no appetite. Breathing unchanged.   Inpatient Medications    Scheduled Meds:  (feeding supplement) PROSource Plus  30 mL Oral TID BM   enoxaparin  (LOVENOX ) injection  30 mg Subcutaneous Q24H   feeding supplement  1 Container Oral TID BM   leptospermum manuka honey  1 Application Topical Daily   linezolid   600 mg Oral Q12H   multivitamin with minerals  1 tablet Oral Daily   potassium chloride  SA  20 mEq Oral BID   rosuvastatin   10 mg Oral Daily   zinc  sulfate (50mg  elemental zinc )  220 mg Oral Daily   Continuous Infusions:  PRN Meds: acetaminophen , magic mouthwash w/lidocaine , ondansetron  **OR** ondansetron  (ZOFRAN ) IV, prochlorperazine , traMADol    Vital Signs    Vitals:   01/17/24 1957 01/18/24 0427 01/18/24 0600 01/18/24 0832  BP: (!) 127/56 (!) 141/65  (!) 125/51  Pulse: 60 61  60  Resp: 19 18  16   Temp: 98.5 F (36.9 C) (!) 97.4 F (36.3 C)  98.5 F (36.9 C)  TempSrc:      SpO2: 100% 90%  100%  Weight:   58.2 kg   Height:        Intake/Output Summary (Last 24 hours) at 01/18/2024 1245 Last data filed at 01/18/2024 0427 Gross per 24 hour  Intake 100 ml  Output 600 ml  Net -500 ml      01/18/2024    6:00 AM 01/09/2024    9:45 PM 01/07/2024    2:34 PM  Last 3 Weights  Weight (lbs) 128 lb 4.2 oz 125 lb 11.2 oz 120 lb 3.2 oz  Weight (kg) 58.18 kg 57.017 kg 54.522 kg      Telemetry    Not on telemetry - Personally Reviewed  Physical Exam   General:  Frail, chronically ill appearing elderly woman, [place] in place Neck: TR pulsation to jaw noted but difficult to assess for sustained JVD point Vascular: Radial pulses 2+ bilaterally Cardiac:  normal S1, S2; somewhat irregular; 3/6 systolic murmur Lungs:  clear to auscultation bilaterally, no wheezing, rhonchi or rales  Abd: soft,  nontender, no hepatomegaly  Ext: bilateral LE with wrinkling, brawny appearance of skin with multiple wounds, today legs are partially wrapped Musculoskeletal:  No deformities Skin: warm and dry  Neuro:  no focal abnormalities noted Psych:  Normal affect   New pertinent results (labs, ECG, imaging, cardiac studies)     Assessment & Plan    Chronic wound infections/cellulitis -reason for admission, managed by primary team, has seen ID, orthopedics, and vascular surgery this admission -pending dispo to SNF   Chronic diastolic heart failure Chronic PV failure Severe pulmonary venous hypertension Chronic kidney disease stage 4 Acute kidney injury -difficult balance, she will be prone to volume overload, but with acute kidney injury agree with holding torsemide  and metolazone  at this time -use daily weights as I/O appear inaccurate -as noted previously, she does not have any further treatment options from a cardiac standpoint. Hospice has been brought up, but she declines -will monitor her renal function and weights, restart diuretics once stable. Cr continues to improve today with holding diuretics and her exam is unchanged   Permanent atrial fibrillation -CHA2DS2/VAS Stroke Risk Points= 7 -not on anticoagulation due to GI bleeds -rate controlled on no nodal agents   Overall very difficult position.  No long term options to improve cardiac conditions. She has multiple high risk conditions that complicate treatment.     Signed, Shelda Bruckner, MD  01/18/2024, 12:45 PM

## 2024-01-19 DIAGNOSIS — N184 Chronic kidney disease, stage 4 (severe): Secondary | ICD-10-CM | POA: Diagnosis not present

## 2024-01-19 DIAGNOSIS — I10 Essential (primary) hypertension: Secondary | ICD-10-CM

## 2024-01-19 DIAGNOSIS — S81802A Unspecified open wound, left lower leg, initial encounter: Secondary | ICD-10-CM | POA: Diagnosis not present

## 2024-01-19 DIAGNOSIS — I5032 Chronic diastolic (congestive) heart failure: Secondary | ICD-10-CM | POA: Diagnosis not present

## 2024-01-19 DIAGNOSIS — I272 Pulmonary hypertension, unspecified: Secondary | ICD-10-CM | POA: Diagnosis not present

## 2024-01-19 LAB — BASIC METABOLIC PANEL WITH GFR
Anion gap: 10 (ref 5–15)
BUN: 53 mg/dL — ABNORMAL HIGH (ref 8–23)
CO2: 27 mmol/L (ref 22–32)
Calcium: 9.3 mg/dL (ref 8.9–10.3)
Chloride: 101 mmol/L (ref 98–111)
Creatinine, Ser: 1.96 mg/dL — ABNORMAL HIGH (ref 0.44–1.00)
GFR, Estimated: 24 mL/min — ABNORMAL LOW (ref >60)
Glucose, Bld: 74 mg/dL (ref 70–99)
Potassium: 5.4 mmol/L — ABNORMAL HIGH (ref 3.5–5.1)
Sodium: 138 mmol/L (ref 135–145)

## 2024-01-19 LAB — VITAMIN A: Vitamin A (Retinoic Acid): 40 ug/dL (ref 22.0–69.5)

## 2024-01-19 MED ORDER — TORSEMIDE 20 MG PO TABS
100.0000 mg | ORAL_TABLET | Freq: Two times a day (BID) | ORAL | Status: DC
  Administered 2024-01-19 – 2024-01-21 (×5): 100 mg via ORAL
  Filled 2024-01-19 (×5): qty 5

## 2024-01-19 NOTE — Consult Note (Signed)
 Advanced Heart Failure Team Consult Note   Primary Physician: [name], MD Cardiologist:  Timothy Gollan, MD  Reason for Consultation: CHF  HPI:    [name] is seen today for evaluation of CHF at the request of Dr. Lonni.   89 y.o. with history of permanent atrial fibrillation, chronic diastolic CHF, CKD stage 3, smoking/prior COPD, renal cell CA s/p right nephrectomy. She has a prior history of HFrEF with nonischemic cardiomyopathy and had a Medtronic ICD placed. However, subsequently her EF has recovered.     06/20, she had left hip ORIF after mechanical fall.  Echo in 06/20 showed EF 60-65%, mild RV dilation with normal systolic function, PASP 93 mmHg.  After she got home from the hospital stay post-ORIF, she noted marked peripheral edema. She was short of breath walking short distances.  +orthopnea. She came back to the hospital 07/20 and was admitted with volume overload. She was diuresed aggressively and lost about 22 lbs. RHC showed pulmonary venous hypertension. PCWP tracing had prominent v-waves in absence of significant MR, suggesting significant diastolic dysfunction. Noted to have significant Fe deficiency anemia, but FOBT was negative during 7/20 hospitalization.    Admitted 11/20 with symptomatic anemia, GI workup concerning for ischemic bowel. 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 Melvenia with VVS and conservative management recommended. Capsule endoscopy showed no definite source of bleeding. She was volume overloaded due to significant RV dysfunction and diuresed, she also developed AKI on CKD stage 3.       10/22, she had bilateral CIA stents placed. ABIs in 11/22 showed patent stents. She had a skin cancer removed from her left leg. 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    Echo 2/23 EF 65-70% with mild LVH, D-shaped septum with moderate RV enlargement, and mildly decreased RV  function, PASP 92, moderate-severe TR, dilated IVC, moderate aortic stenosis. Echo in 10/23 showed EF 60-65%, D-shaped septum, mild RV enlargement with normal RV systolic function, PASP 90, moderate-severe TR, mild AS, IVC dilated.    Patient was found to have a neuroendocrine tumor of the pancreatic head in the fall of 2023.  She has been treated with sandostatin  monthly.  She also had further GI bleeding from small bowel AVMs in fall 2023. She also had COVID-19 in 10/23 and has been on home oxygen  2L since that time.    Follow up with Dr. Fernande, long discussion about EOL. Given DNR form and high-voltage therapies inactivated on ICD.   Admit 07/24 with acute on chronic blood loss anemia 2/2 GI bleed. Transfused and given IV iron . Had small bowel enteroscopy which showed 2 nonbleeding angiodysplastic lesions in duodenum and 3 nonbleeding angioplastic lesions in jejunum treated with APC.   Admitted early 09/24 with acute on chronic anemia suspected 2/2 chronic GI blood loss. Hgb down to 6.8. Received 2 u RBCs. GI did not recommend any endoscopic procedures d/t advanced age and comorbidities. Course further c/b acute on chronic CHF. She was diuresed with IV lasix  and metolazone . Echo during admit with LVEF 65-70%, D-shaped septum, mildly reduced RV, RVSP 103 mmHg, biatrial enlargement, severe TR, moderate mitral stenosis with mean gradient of 6 mmHg, dilated IVC.   She was seen back in the [facility] on 01/30/23 for post hospital f/u and noted recurrent melena, SOB and fatigued w/ minimal exertion. Also noted to have recurrent volume overload w/ marked return of LEE. Hgb was down to 6.6.  She was sent back to the ED and readmitted again for a/c CHF and GIB, treated w/ blood transfusions, IV Fe and diuretics. Discharged on 02/06/23 on torsemide  100 mg bid.    S/p 2u RBCs 04/26/23 for symptomatic anemia. Hgb 6.8.    Admitted 05/27/23 due to shortness of breath/ leg swelling. Initially given IV lasix   with removal of  ~ 4Land transitioned to oral torsemide  at 100mg  BID at discharge. Metolazone  decreased to weekly. Treated with antibiotics for leg cellulitis. Given 1 unit PRBC's due to hemoglobin trending down to 7.1. Wound care consulted due to weeping area on left shin. Palliative care also consulted.    She was admitted in 2/25 with lower leg cellulitis.  She was treated with IV abx. She was admitted again in 4/25 with lower leg cellulitis.  She was admitted in 5/25 with GI bleeding/anemia.    Echo in 5/25 showed EF 60-65%, mild LVH, d-shaped interventricular septum, severe RV enlargement with moderately decreased RV systolic function, PASP 96 mmHg, severe TR, mild MR with moderate MS (mean gradient 5 mmHg), moderate AS, IVC dilated.    She has had ongoing chronic lower extremity infections/weeping, has been followed closely. She was called 01/08/24 after wound swab grew MRSA and corynebacterium, and X ray was concerning for soft tissue gas. Recommended to present to ER, which she did on 9/5. She was admitted for treatment of her wound infection. Infectious disease, orthopedic surgery, and vascular surgery all consulted on patient, no need for orthopedic intervention as no osteomyelitis, not felt to be vascular in etiology but debridement offered, patient declined. Recommended to biopsy to rule out SCC but could not be done as she is on bevacizumab . Ultimately managed with antibiotics, initially cefepime  and linezolid , then linezolid  alone.  Venous dopplers showed no DVT. Diuretics were held with elevated creatinine, creatinine now trending down (1.96 today).  At home, she takes torsemide  100 mg bid with metolazone  2.5 mg three times per week.  She has not been out of bed much, plans for SNF when discharged.   Home Medications Prior to Admission medications   Medication Sig Start Date End Date Taking? Authorizing Provider  acetaminophen  (TYLENOL ) 500 MG tablet Take 1,000 mg by mouth every 8 (eight) hours.   Yes  [provider]  Ascorbic Acid  (VITAMIN C  PO) Take 1 tablet by mouth daily.   Yes [provider]  Cholecalciferol  (VITAMIN D -3 PO) Take 2 capsules by mouth daily.   Yes [provider]  CRANBERRY PO Take 1 tablet by mouth daily.   Yes [provider]  Cyanocobalamin  (B-12 PO) Take 1 tablet by mouth daily.   Yes [provider]  ferrous gluconate  (FERGON) 324 MG tablet Take 1 tablet (324 mg total) by mouth daily with breakfast. 11/15/22  Yes Rai, Ripudeep K, MD  fluticasone  (FLONASE ) 50 MCG/ACT nasal spray Place 2 sprays into both nostrils daily as needed for allergies or rhinitis. 07/04/22  Yes [name], MD  metolazone  (ZAROXOLYN ) 2.5 MG tablet Take 1 tablet (2.5 mg total) by mouth 3 (three) times a week. Every Tues., Thurs and Sat. with 40 meq of potassium 07/25/23  Yes Stoner, Benjamin J, MD  Multiple Vitamins-Minerals (HAIR SKIN NAILS PO) Take 1 capsule by mouth daily.   Yes [provider]  Potassium Chloride  ER 20 MEQ TBCR Take 1 tablet (20 mEq total) by mouth daily. Patient taking differently: Take 20 mEq by mouth 2 (two) times daily. 07/24/23 01/09/24 Yes Zenaida Morene PARAS, MD  torsemide  (DEMADEX ) 20 MG tablet Take 5 tablets (100 mg total) by mouth 2 (two) times daily. 07/10/23  Yes [name], MD  traMADol  (ULTRAM ) 50 MG tablet Take 1 tablet (50 mg total) by mouth every 12 (twelve) hours as needed. 01/02/24  Yes [name], MD  clotrimazole -betamethasone  (LOTRISONE ) cream Apply topically 2 (two) times daily. Patient not taking: Reported on 01/09/2024 09/08/23   Von Bellis, MD  gabapentin  (NEURONTIN ) 100 MG capsule Take 1 capsule (100 mg total) by mouth at bedtime. Patient not taking: Reported on 01/09/2024 12/01/23   [name], MD  pantoprazole  (PROTONIX ) 40 MG tablet Take 1 tablet (40 mg total) by mouth daily. Patient not taking: Reported on 01/09/2024 11/09/22   Rai, Nydia POUR, MD  rosuvastatin  (CRESTOR ) 10 MG tablet  Take 1 tablet (10 mg total) by mouth daily. Patient not taking: Reported on 12/01/2023 06/20/23   Maree Hue, MD  SANTYL 250 UNIT/GM ointment Apply topically daily. Patient not taking: Reported on 01/09/2024 10/15/23   [provider]    Past Medical History: Past Medical History:  Diagnosis Date   (HFpEF) heart failure with preserved ejection fraction (HCC)    EF=60-60%   Actinic keratosis 01/17/2015   R forearm   Adjustment disorder with anxiety    Adverse effect of other narcotics, sequela    Intolerance to all narcotics   Anti-Duffy antibodies present    Aortic valve stenosis    Arthritis    some in my hands (11/11/2012)   Atrial fibrillation, permanent (HCC)    Eliquis    Atypical mole 03/25/2018   L forearm - severe   Automatic implantable cardioverter-defibrillator in situ    Avascular necrosis of hip (HCC) 05/03/2011   Carotid artery stenosis 09/2007   a. 09/2007: 60-79% bilateral (stable); b. 10/2008: 40-59% R 60-79%    Cellulitis of left lower extremity 07/13/2020   CKD (chronic kidney disease), stage III (HCC)    COPD (chronic obstructive pulmonary disease) (HCC)    Coronary artery disease    non-obstructive by 2006 cath   COVID-19 virus infection 02/12/2022   Displaced fracture of left femoral neck (HCC) 10/09/2018   GI bleed 03/28/2020   AVM   High cholesterol    Hypertension 05/20/2011   Hypertr obst cardiomyop    Hypotension, unspecified    cardiac cath 2006..nonobstructive CAD 30-40s lesions.SABRAETT 1/09 nondiagnostic due to poor HR response..Right Renal Cancer 2003   Iron  deficiency anemia    Long term (current) use of anticoagulants    On home oxygen  therapy    3L [place]   Osteoarthritis of right hip    PAD (peripheral artery disease) (HCC)    Pancreatic cancer (HCC)    sandostatin    PONV (postoperative nausea and vomiting)    Presence of permanent cardiac pacemaker    Pulmonary HTN (HCC)    RECTAL BLEEDING 10/13/2009   Qualifier: Diagnosis of  By:  Kerman NP, Paula     Red blood cell antibody positive with compatible PRBC difficult to obtain    Anti FYA (Duffy a) antibody. Must be transfused with PRB which are Duffy Antigen Negative and Crossmatch Compatible   Renal cell carcinoma (HCC) 2005   s/p right nephrectomy   Squamous cell carcinoma of skin 01/17/2015   R lat wrist   Squamous cell carcinoma of skin 01/29/2018   R post upper leg - superficially invasive   Squamous cell carcinoma of skin 03/25/2018   L lat foot   Squamous cell carcinoma of skin 11/09/2020  left lat foot - EDC 01/01/21, recurrent 01/31/21 - MOHs 02/22/21   Squamous cell carcinoma of skin 12/19/2021   Right Posterior Medial Thigh, EDC   Squamous cell carcinoma of skin 12/19/2021   SCC IS, L lat heel, EDC 02/04/2022   Squamous cell carcinoma of skin 01/21/2022   L forearm, EDC 02/04/2022   Squamous cell carcinoma of skin 01/21/2022   R lower leg below knee, EDC 02/04/2022   Squamous cell carcinoma of skin 01/21/2022   SCCIS, R post heel, EDC 02/04/2022   Squamous cell carcinoma of skin 07/01/2022   left lower abdomen, in situ, EDC   Squamous cell carcinoma of skin 07/01/2022   left medial chest, EDC   Urge incontinence    Venous stasis of both lower extremities     Past Surgical History: Past Surgical History:  Procedure Laterality Date   ABDOMINAL AORTOGRAM W/LOWER EXTREMITY N/A 02/12/2021   Procedure: ABDOMINAL AORTOGRAM W/LOWER EXTREMITY;  Surgeon: Sheree Penne Bruckner, MD;  Location: [facility] INVASIVE CV LAB;  Service: Cardiovascular;  Laterality: N/A;   ABDOMINAL HYSTERECTOMY  1975   for benign causes   APPENDECTOMY     BI-VENTRICULAR PACEMAKER UPGRADE  05/04/2010   BIOPSY  02/28/2019   Procedure: BIOPSY;  Surgeon: Teressa Toribio SQUIBB, MD;  Location: [facility] ENDOSCOPY;  Service: Endoscopy;;   BIOPSY  03/04/2022   Procedure: BIOPSY;  Surgeon: Wilhelmenia Aloha Raddle., MD;  Location: [facility];  Service: Gastroenterology;;   CARDIAC CATHETERIZATION   2006   CARDIOVERSION N/A 02/20/2018   Procedure: CARDIOVERSION;  Surgeon: [name], MD;  Location: [facility] ORS;  Service: Cardiovascular;  Laterality: N/A;   CARDIOVERSION N/A 03/27/2018   Procedure: CARDIOVERSION;  Surgeon: [name], MD;  Location: [facility] ORS;  Service: Cardiovascular;  Laterality: N/A;   CATARACT EXTRACTION W/ INTRAOCULAR LENS  IMPLANT, BILATERAL  01/2006-02-2006   CHOLECYSTECTOMY N/A 11/11/2012   Procedure: LAPAROSCOPIC CHOLECYSTECTOMY WITH INTRAOPERATIVE CHOLANGIOGRAM;  Surgeon: Donnice POUR. Belinda, MD;  Location: [facility] OR;  Service: General;  Laterality: N/A;   COLONOSCOPY WITH PROPOFOL  N/A 02/28/2019   Procedure: COLONOSCOPY WITH PROPOFOL ;  Surgeon: Teressa Toribio SQUIBB, MD;  Location: [facility] ENDOSCOPY;  Service: Endoscopy;  Laterality: N/A;   ENTEROSCOPY N/A 03/30/2020   Procedure: ENTEROSCOPY;  Surgeon: San Sandor GAILS, DO;  Location: [facility] ENDOSCOPY;  Service: Gastroenterology;  Laterality: N/A;   ENTEROSCOPY N/A 02/10/2021   Procedure: ENTEROSCOPY;  Surgeon: Rollin Dover, MD;  Location: [facility] ENDOSCOPY;  Service: Endoscopy;  Laterality: N/A;   ENTEROSCOPY N/A 02/15/2022   Procedure: ENTEROSCOPY;  Surgeon: Federico Rosario BROCKS, MD;  Location: [facility] ENDOSCOPY;  Service: Gastroenterology;  Laterality: N/A;   ENTEROSCOPY N/A 11/06/2022   Procedure: ENTEROSCOPY;  Surgeon: Wilhelmenia Aloha Raddle., MD;  Location: [facility] ENDOSCOPY;  Service: Gastroenterology;  Laterality: N/A;   EP IMPLANTABLE DEVICE N/A 02/21/2016   Procedure: ICD Generator Changeout;  Surgeon: Elspeth BROCKS Sage, MD;  Location: [facility] INVASIVE CV LAB;  Service: Cardiovascular;  Laterality: N/A;   ESOPHAGOGASTRODUODENOSCOPY N/A 03/04/2022   Procedure: ESOPHAGOGASTRODUODENOSCOPY (EGD);  Surgeon: Wilhelmenia Aloha Raddle., MD;  Location: [facility];  Service: Gastroenterology;  Laterality: N/A;   ESOPHAGOGASTRODUODENOSCOPY (EGD) WITH PROPOFOL  N/A 02/28/2019   Procedure: ESOPHAGOGASTRODUODENOSCOPY (EGD) WITH PROPOFOL ;  Surgeon:  Teressa Toribio SQUIBB, MD;  Location: [facility] ENDOSCOPY;  Service: Endoscopy;  Laterality: N/A;   EUS N/A 03/04/2022   Procedure: UPPER ENDOSCOPIC ULTRASOUND (EUS) RADIAL;  Surgeon: Wilhelmenia Aloha Raddle., MD;  Location: [facility] ENDOSCOPY;  Service: Gastroenterology;  Laterality: N/A;   FINE NEEDLE ASPIRATION N/A 03/04/2022  Procedure: FINE NEEDLE ASPIRATION (FNA) LINEAR;  Surgeon: Wilhelmenia Aloha Raddle., MD;  Location: [facility] ENDOSCOPY;  Service: Gastroenterology;  Laterality: N/A;   GIVENS CAPSULE STUDY N/A 03/15/2019   Procedure: GIVENS CAPSULE STUDY;  Surgeon: Eda Iha, MD;  Location: [facility] ENDOSCOPY;  Service: Gastroenterology;  Laterality: N/A;   HOT HEMOSTASIS N/A 03/30/2020   Procedure: HOT HEMOSTASIS (ARGON PLASMA COAGULATION/BICAP);  Surgeon: San Sandor GAILS, DO;  Location: [facility] ENDOSCOPY;  Service: Gastroenterology;  Laterality: N/A;   HOT HEMOSTASIS N/A 02/10/2021   Procedure: HOT HEMOSTASIS (ARGON PLASMA COAGULATION/BICAP);  Surgeon: Rollin Dover, MD;  Location: [facility] ENDOSCOPY;  Service: Endoscopy;  Laterality: N/A;   HOT HEMOSTASIS N/A 02/15/2022   Procedure: HOT HEMOSTASIS (ARGON PLASMA COAGULATION/BICAP);  Surgeon: Federico Rosario BROCKS, MD;  Location: [facility] ENDOSCOPY;  Service: Gastroenterology;  Laterality: N/A;   HOT HEMOSTASIS N/A 11/06/2022   Procedure: HOT HEMOSTASIS (ARGON PLASMA COAGULATION/BICAP);  Surgeon: Wilhelmenia Aloha Raddle., MD;  Location: [facility] ENDOSCOPY;  Service: Gastroenterology;  Laterality: N/A;   INSERT / REPLACE / REMOVE PACEMAKER  05/01/2011   02-28-05-/05-04-10-ICD-MEDTRONIC MAXIMAL DR   IR ANGIOGRAM SELECTIVE EACH ADDITIONAL VESSEL  02/18/2022   IR ANGIOGRAM SELECTIVE EACH ADDITIONAL VESSEL  02/18/2022   IR ANGIOGRAM VISCERAL SELECTIVE  02/16/2022   IR ANGIOGRAM VISCERAL SELECTIVE  02/16/2022   IR EMBO ART  VEN HEMORR LYMPH EXTRAV  INC GUIDE ROADMAPPING  02/16/2022   IR THORACENTESIS ASP PLEURAL SPACE W/IMG GUIDE  02/21/2022   IR THORACENTESIS ASP PLEURAL SPACE W/IMG GUIDE   02/22/2022   IR US  GUIDE VASC ACCESS RIGHT  02/16/2022   JOINT REPLACEMENT     LAPAROSCOPIC CHOLECYSTECTOMY  11/11/2012   LAPAROSCOPIC LYSIS OF ADHESIONS N/A 11/11/2012   Procedure: LAPAROSCOPIC LYSIS OF ADHESIONS;  Surgeon: Donnice POUR. Belinda, MD;  Location: [facility] OR;  Service: General;  Laterality: N/A;   NEPHRECTOMY Right 06/2001    S/P RENAL CELL CANCER   PERIPHERAL VASCULAR INTERVENTION Bilateral 02/12/2021   Procedure: PERIPHERAL VASCULAR INTERVENTION;  Surgeon: Sheree Penne Bruckner, MD;  Location: [facility] INVASIVE CV LAB;  Service: Cardiovascular;  Laterality: Bilateral;  Iliac artery stents   PRESSURE SENSOR/CARDIOMEMS N/A 02/03/2019   Procedure: PRESSURE SENSOR/CARDIOMEMS;  Surgeon: [name], MD;  Location: [facility] INVASIVE CV LAB;  Service: Cardiovascular;  Laterality: N/A;   RIGHT HEART CATH N/A 11/09/2018   Procedure: RIGHT HEART CATH;  Surgeon: [name], MD;  Location: [facility] INVASIVE CV LAB;  Service: Cardiovascular;  Laterality: N/A;   RIGHT HEART CATH N/A 03/08/2019   Procedure: RIGHT HEART CATH;  Surgeon: [name], MD;  Location: [facility] INVASIVE CV LAB;  Service: Cardiovascular;  Laterality: N/A;   SUBMUCOSAL TATTOO INJECTION  02/28/2019   Procedure: SUBMUCOSAL TATTOO INJECTION;  Surgeon: Teressa Toribio SQUIBB, MD;  Location: [facility] ENDOSCOPY;  Service: Endoscopy;;   SUBMUCOSAL TATTOO INJECTION  11/06/2022   Procedure: SUBMUCOSAL TATTOO INJECTION;  Surgeon: Wilhelmenia Aloha Raddle., MD;  Location: [facility] ENDOSCOPY;  Service: Gastroenterology;;   TOTAL HIP ARTHROPLASTY Right 05/03/2011   Procedure: TOTAL HIP ARTHROPLASTY ANTERIOR APPROACH;  Surgeon: Bruckner CINDERELLA Poli;  Location: [facility] ORS;  Service: Orthopedics;  Laterality: Right;  Removal of Cannulated Screws Right Hip, Right Direct Anterior Hip Replacement   TOTAL HIP ARTHROPLASTY Left 10/09/2018   Procedure: TOTAL HIP ARTHROPLASTY ANTERIOR APPROACH;  Surgeon: Fidel Rogue, MD;  Location: [facility] OR;  Service: Orthopedics;   Laterality: Left;    Family History: Family History  Problem Relation Age of Onset   Heart failure Mother    Early death Father  car accident   Breast cancer Maternal Aunt 82   Breast cancer Cousin    Breast cancer Other    Colon cancer Neg Hx    Esophageal cancer Neg Hx    Liver disease Neg Hx    Pancreatic cancer Neg Hx    Rectal cancer Neg Hx     Social History: Social History   Socioeconomic History   Marital status: Widowed    Spouse name: Not on file   Number of children: 2   Years of education: high school   Highest education level: Not on file  Occupational History   Occupation: Retired    Associate Professor: RETIRED  Tobacco Use   Smoking status: Former    Current packs/day: 0.00    Average packs/day: 0.5 packs/day for 40.0 years (20.0 ttl pk-yrs)    Types: Cigarettes    Start date: 05/06/1961    Quit date: 05/06/2001    Years since quitting: 22.7   Smokeless tobacco: Never  Vaping Use   Vaping status: Never Used  Substance and Sexual Activity   Alcohol use: No    Alcohol/week: 0.0 standard drinks of alcohol   Drug use: No   Sexual activity: Not Currently  Other Topics Concern   Not on file  Social History Narrative   Would desire CPR   07/11/20   From: the area   Living: with great-grandson - [place] (2003)   Work: retired - clerical      Family: 2 children - [place] and [place] - 2 grand chlidren - 5 great grand children      Enjoys: spending time with friends - watch move, eat out, spend time      Exercise: walking around the house   Diet: not good, limits red meat, limits salt, low appetite      Safety   Seat belts: Yes    Guns: Yes  and secure   Safe in relationships: Yes    Social Drivers of Corporate investment banker Strain: Low Risk  (11/06/2023)   Overall Financial Resource Strain (CARDIA)    Difficulty of Paying Living Expenses: Not hard at all  Food Insecurity: No Food Insecurity (01/09/2024)   Hunger Vital Sign    Worried About Running  Out of Food in the Last Year: Never true    Ran Out of Food in the Last Year: Never true  Transportation Needs: No Transportation Needs (01/09/2024)   PRAPARE - Administrator, Civil Service (Medical): No    Lack of Transportation (Non-Medical): No  Physical Activity: Insufficiently Active (11/06/2023)   Exercise Vital Sign    Days of Exercise per Week: 3 days    Minutes of Exercise per Session: 20 min  Stress: No Stress Concern Present (11/06/2023)   [employer] of Occupational Health - Occupational Stress Questionnaire    Feeling of Stress: Not at all  Social Connections: Socially Isolated (01/09/2024)   Social Connection and Isolation Panel    Frequency of Communication with Friends and Family: More than three times a week    Frequency of Social Gatherings with Friends and Family: More than three times a week    Attends Religious Services: Never    Database administrator or Organizations: No    Attends Banker Meetings: Never    Marital Status: Widowed    Allergies:  Allergies  Allergen Reactions   Codeine Nausea And Vomiting and Other (See Comments)    Hallucinations, too- CANNOT HAVE; thinks  she may have received Narcan  to reverse the effect   Dilaudid  [Hydromorphone ] Other (See Comments)    Excessive Somnolence- Required Narcan  X2   Tape Other (See Comments)    CAN TOLERATE ONLY EASY-RELEASE, PAPER TAPE, AS THE SKIN IS VERY THIN AND WILL BRUISE AND TEAR VERY EASILY   Amoxil  [Amoxicillin ] Nausea Only   Dorethia Handsome ] Other (See Comments)    Told to avoid due to reduced kidney function, has 1 remaining kidney   Ms Contin  [Morphine ] Nausea And Vomiting and Other (See Comments)    Hallucinations, also   Neurontin  [Gabapentin ] Other (See Comments)    Out of it   Nsaids Other (See Comments)    Told to avoid due to reduced kidney function, has 1 remaining kidney    Objective:    Vital Signs:   Temp:  [98.1 F (36.7 C)-98.7 F (37.1 C)] 98.1 F  (36.7 C) (09/15 0731) Pulse Rate:  [61-81] 81 (09/15 0731) Resp:  [16-19] 16 (09/15 0731) BP: (133-146)/(54-83) 133/83 (09/15 0731) SpO2:  [90 %-100 %] 90 % (09/15 0731) Weight:  [54 kg] 54 kg (09/15 0339) Last BM Date : 01/18/24  Weight change: Filed Weights   01/09/24 2145 01/18/24 0600 01/19/24 0339  Weight: 57 kg 58.2 kg 54 kg    Intake/Output:   Intake/Output Summary (Last 24 hours) at 01/19/2024 0847 Last data filed at 01/19/2024 0500 Gross per 24 hour  Intake --  Output 400 ml  Net -400 ml      Physical Exam    General:  Chronically ill-appearing HEENT: normal Neck: supple. JVP 10-12 cm with prominent CV waves. Carotids 2+ bilat; no bruits. No lymphadenopathy or thyromegaly appreciated. Cor: PMI nondisplaced. Regular rate & rhythm. 3/6 HSM LLSB. Lungs: clear Abdomen: soft, nontender, nondistended. No hepatosplenomegaly. No bruits or masses. Good bowel sounds. Extremities: Chronic venous stasis changes with lower legs wrapped.  Neuro: alert & orientedx3, cranial nerves grossly intact. moves all 4 extremities w/o difficulty. Affect pleasant   Telemetry   Not on telemetry  EKG    No ECG  Labs   Basic Metabolic Panel: Recent Labs  Lab 01/16/24 0406 01/17/24 0451 01/18/24 0314 01/19/24 0351  NA 135 134* 135 138  K 4.4 4.4 4.8 5.4*  CL 92* 97* 98 101  CO2 32 28 27 27   GLUCOSE 107* 99 95 74  BUN 62* 60* 60* 53*  CREATININE 2.41* 2.30* 2.14* 1.96*  CALCIUM  9.1 8.8* 9.1 9.3  MG 2.2  --   --   --     Liver Function Tests: No results for input(s): AST, ALT, ALKPHOS, BILITOT, PROT, ALBUMIN  in the last 168 hours. No results for input(s): LIPASE, AMYLASE in the last 168 hours. No results for input(s): AMMONIA in the last 168 hours.  CBC: Recent Labs  Lab 01/16/24 0406  WBC 3.4*  HGB 9.6*  HCT 32.1*  MCV 80.5  PLT 159    Cardiac Enzymes: No results for input(s): CKTOTAL, CKMB, CKMBINDEX, TROPONINI in the last 168  hours.  BNP: BNP (last 3 results) Recent Labs    06/13/23 1552 06/24/23 1621 07/10/23 1641  BNP 349.6* 516.7* 322.2*    ProBNP (last 3 results) No results for input(s): PROBNP in the last 8760 hours.   CBG: No results for input(s): GLUCAP in the last 168 hours.  Coagulation Studies: No results for input(s): LABPROT, INR in the last 72 hours.   Imaging   No results found.   Medications:     Current  Medications:  (feeding supplement) PROSource Plus  30 mL Oral TID BM   enoxaparin  (LOVENOX ) injection  30 mg Subcutaneous Q24H   feeding supplement  1 Container Oral TID BM   leptospermum manuka honey  1 Application Topical Daily   linezolid   600 mg Oral Q12H   multivitamin with minerals  1 tablet Oral Daily   potassium chloride  SA  20 mEq Oral BID   rosuvastatin   10 mg Oral Daily   torsemide   100 mg Oral BID   zinc  sulfate (50mg  elemental zinc )  220 mg Oral Daily    Infusions:     Assessment/Plan   1. Chronic diastolic CHF: With prominent RV failure. She had remote nonischemic cardiomyopathy with recovery of EF, has [employer] ICD. Atrial fibrillation likely contributes, but this appears to be permanent now as she has failed multiple anti-arrhythmics and is reasonably rate-controlled. RHC 11/20 showed elevated filling pressures and pulmonary venous hypertension. Prominent v-waves on PCWP tracing likely due to stiff ventricle/diastolic dysfunction as she had only mild MR on echo. She is RV pacing at a high percentage, but LV EF has remained normal. Most recent echo in 5/25 showed EF 60-65%, mild LVH, d-shaped interventricular septum, severe RV enlargement with moderately decreased RV systolic function, PASP 96 mmHg, severe TR, mild MR with moderate MS (mean gradient 5 mmHg), moderate AS, IVC dilated. Patient has prominent JVD but this is in the setting of severe TR.  Diuretics have been on hold with rise in creatinine, now back down to 1.96 (this is her baseline).   - I will restart her torsemide  100 mg bid.  Peripheral edema currently minimal, but this will make wound healing more difficult if it worsens. This should help with mildly elevated K.  - Hold off on metolazone  for now, but would restart soon (likely when discharged if sent to SNF soon).  - Off SGLT2i after UTIs 2. Hypertension: BP not elevated.  3. Pulmonary hypertension:  Based on RHC 11/20, she has primarily pulmonary venous hypertension, not candidate for pulmonary vasodilators.  4. CKD: Stage 4. Off SGLT2i.  Creatinine now at baseline.  - Restart torsemide  as above.  5. Chronic anemia/GI bleeding: Due to small bowel AVMs. She has had transfusions and IV Fe, she was most recently admitted for this in 5/25.  - She is now off anticoagulation.  - Lovenox  DVT prophylaxis dose.  6. Atrial fibrillation: Permanent. Rate control is reasonable off nodal blockers. She has failed amiodarone , Tikosyn , and flecainide  as well as multiple cardioversions.  - Off Eliquis  with recurrent GI bleeding and transfusions.  - Not Watchman candidate due to frailty and poorly compensated CHF.  7. Neuroendocrine tumor of pancreatic head - Ongoing treatment per oncology.  8. Tricuspid regurgitation: Severe on 5/25 echo.   9. Mitral stenosis: Moderate on 5/25 echo.  - Follow, not surgical candidate.  10. Aortic stenosis: Moderate on 5/25 echo.  11. Cellulitis/chronic LE wounds: Suspect recurrent lower extremity cellulitis with MRSA and corynebacterium, now on linezolid .  No evidence for DVT or osteomyelitis.   - Continue linezolid  per primary.  12. Disposition: Plan SNF. Would try to mobilize.   Hospice is a reasonable consideration here but she has not been ready.    Length of Stay: 10  Ezra Shuck, MD  01/19/2024, 8:47 AM    Advanced Heart Failure Team Pager [phone] (M-F; 7a - 5p)  Please contact [facility] for night-coverage after hours (4p -7a ) and weekends on [email or web address]

## 2024-01-19 NOTE — Progress Notes (Signed)
 Occupational Therapy Treatment Patient Details Name: [name] MRN: [id number] DOB: 06-Nov-1933 Today's Date: 01/19/2024   History of present illness Pt is a 89 y/o F presenting to ED with c/o MRSA in LLE, BLE wounds. Imaging negative for DVT, osteomyelitis. PMH significant for PVD, pancreatic CA on chemotherapy, HRpEF, CKD-III, hx of nephrectomy, squamous cell carcinoma, HLD, pacemaker, COPD with chronic O2 use at home.   OT comments  Pt seen for OT treatment session this date, tangetial requiring redirection to task and often anxious due to pain & hypersensitivity to BLE. Pt requires increased time + reassurance with therapeutic listening for all mobility, CGA to transition from supine to sitting and is able to sit upright unsupported at EOB with supervision, up to MIN A to comb tangles from hair. Pt laterally scoots along EOB, preferring to shift weight from hips and avoid WB through BLE, MIN A to scoot. Pt left positioned on L side to relieve pressure from sacrum. Encouraged pt to eat lunch and perform OOB mobility as tolerated. Pt left with needs in reach, discharge recommendation appropriate.       If plan is discharge home, recommend the following:  A little help with walking and/or transfers;A lot of help with bathing/dressing/bathroom   Equipment Recommendations  BSC/3in1       Precautions / Restrictions Precautions Precautions: Fall;ICD/Pacemaker Recall of Precautions/Restrictions: Impaired Precaution/Restrictions Comments: pacemaker, BLE wounds Restrictions Weight Bearing Restrictions Per Provider Order: No       Mobility Bed Mobility Overal bed mobility: Needs Assistance Bed Mobility: Supine to Sit, Rolling Rolling: Supervision   Supine to sit: Contact guard, HOB elevated, Used rails Sit to supine: Contact guard assist, HOB elevated   General bed mobility comments: pt left positioned on L side to offset pressure from sacrum    Transfers Overall transfer  level: Needs assistance   Transfers: Bed to chair/wheelchair/BSC            Lateral/Scoot Transfers: Min assist General transfer comment: pt with increased pain in BLE; did not want to WB through BLE lateral scoot towards HOB with MIN A     Balance Overall balance assessment: Needs assistance Sitting-balance support: Feet unsupported, No upper extremity supported Sitting balance-Leahy Scale: Good Sitting balance - Comments: able to maintain upright without assistance, feet did not touch ground due to pain                                   ADL either performed or assessed with clinical judgement   ADL Overall ADL's : Needs assistance/impaired     Grooming: Brushing hair;Sitting;Minimal assistance Grooming Details (indicate cue type and reason): sitting at EOB                             Functional mobility during ADLs: Contact guard assist       Communication Communication Communication: No apparent difficulties   Cognition Arousal: Alert Behavior During Therapy: Anxious Cognition: No family/caregiver present to determine baseline             OT - Cognition Comments: tangential and requires redirection                 Following commands: Impaired Following commands impaired: Follows one step commands with increased time      Cueing   Cueing Techniques: Verbal cues, Visual cues, Gestural cues  Pertinent Vitals/ Pain       Pain Assessment Pain Assessment: Faces Faces Pain Scale: Hurts a little bit Pain Location: BLE Pain Descriptors / Indicators: Grimacing Pain Intervention(s): Limited activity within patient's tolerance, Monitored during session, Repositioned         Frequency  Min 2X/week        Progress Toward Goals  OT Goals(current goals can now be found in the care plan section)  Progress towards OT goals: Progressing toward goals  Acute Rehab OT Goals OT Goal Formulation: With  patient Time For Goal Achievement: 01/29/24 Potential to Achieve Goals: Good ADL Goals Pt Will Perform Grooming: with modified independence;standing Pt Will Perform Lower Body Dressing: sit to/from stand;with supervision Pt Will Transfer to Toilet: with modified independence;ambulating;regular height toilet  Plan         AM-PAC OT 6 Clicks Daily Activity     Outcome Measure   Help from another person eating meals?: None Help from another person taking care of personal grooming?: A Little Help from another person toileting, which includes using toliet, bedpan, or urinal?: A Little Help from another person bathing (including washing, rinsing, drying)?: A Lot Help from another person to put on and taking off regular upper body clothing?: A Little Help from another person to put on and taking off regular lower body clothing?: A Lot 6 Click Score: 17    End of Session Equipment Utilized During Treatment: Oxygen   OT Visit Diagnosis: Unsteadiness on feet (R26.81)   Activity Tolerance Patient tolerated treatment well   Patient Left in bed;with call bell/phone within reach;with bed alarm set   Nurse Communication Mobility status        Time: 8596-8571 OT Time Calculation (min): 25 min  Charges: OT General Charges $OT Visit: 1 Visit OT Treatments $Self Care/Home Management : 23-37 mins  Sanika Brosious L. Emelda Kohlbeck, OTR/L  01/19/24, 3:15 PM

## 2024-01-19 NOTE — Progress Notes (Signed)
 Dressing to bilateral legs. Patient tolerated the dressing changed well.   Of note, dressings were also done at ~2300 on 9/14 per chart review.

## 2024-01-19 NOTE — Plan of Care (Signed)
   Problem: Health Behavior/Discharge Planning: Goal: Ability to manage health-related needs will improve Outcome: Progressing

## 2024-01-19 NOTE — Plan of Care (Signed)
  Problem: Clinical Measurements: Goal: Will remain free from infection Outcome: Progressing   Problem: Activity: Goal: Risk for activity intolerance will decrease Outcome: Progressing   Problem: Nutrition: Goal: Adequate nutrition will be maintained Outcome: Progressing   Problem: Pain Managment: Goal: General experience of comfort will improve and/or be controlled Outcome: Progressing   Problem: Safety: Goal: Ability to remain free from injury will improve Outcome: Progressing

## 2024-01-19 NOTE — Progress Notes (Signed)
 PROGRESS NOTE    [name]  FMW:[id number] DOB: 28-Jun-1933 DOA: 01/09/2024 PCP: [name], MD  132A/132A-AA  LOS: 10 days   Brief hospital course:   Assessment & Plan: [name] is a 89 y.o. 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 Patient has 2 lower extremity ulcers, poor healing.  Has been on antibiotics, cultures growing MRSA and Corynebacterium, was sent to the hospital for IV antibiotics.    Infected left lower extremity wound with surrounding cellulitis - Patient has outpatient culture apparently growing MRSA and corynebacterium - XR left tibia/fibula large soft tissue defect within the distal left lower leg unchanged, persistent diffuse subcutaneous edema.  No acute/destructive bony abnormalities - US  negative for DVT bilaterally, US  ABI right ABI 0.78, left could not be evaluated - CT with no bony destruction - Continue linezolid  x 7 days, discontinue cefepime , Seen by ID - Follow-up blood cultures - Seen by WOC, recommend debridement - Seen by vascular surgery, intraoperative debridement would be appropriate, unclear etiology - Discussed with oncology, will not be able to do debridement for 4 to 6 weeks as patient has been on Avastin .  If there is plan for intervention, we will have to hold Avastin  and anemia will get worse - Seen by orthopedics, appreciate recs.  No signs of bone involvement - Seen by ID, appreciate recs --emphasized  again with pt that wounds will not heal without it being covered and kept moist. --cont Linezolid  --modified dressing change per order (RN to ask to document compliance)   Hypokalemia - resolved --cont potassium suppl scheduled --supplement PRN   AKI on CKD 3b - Cr improved s/p IV fluids, Baseline Cr 1.7-1.9 --Cr went up to 2.41 on 9/12.  home torsemide  and metolazone  held.  Ordered gentle MIVF.   --daughter is extremely concerned about holding diuretics and would like cardio to be involved. --consulted cardio to manage pt's diuretics --resume home torsemide  today, per cardio  Normocytic anemia - Monitor Hb   Chronic diastolic heart failure Hypertrophic cardiomyopathy Status post ICD placement.   Pulmonary HTN --daughter is extremely concerned about holding diuretics and would like cardio to be involved. --consulted cardio to manage pt's diuretics --resume home torsemide  today, per cardio   COPD with chronic hypoxic respiratory failure On home oxygen  2L [place] --stable   Hx pancreatic neuroendocrine cancer Patient is on chemotherapy.   Continue follow-up with oncologist outpatient   Hyperlipidemia:  --cont statin   CAD:  --cont statin   Peripheral vascular disease:  --cont statin  Hyponatremia --has hx of intermittent hyponatremia --monitor  Severe malnutrition in context of chronic illness  --supplements per dietician  Permanent Afib --not on anticoagulation due to GI bleeds --rate controlled on no nodal agents   DVT prophylaxis: Lovenox  SQ Code Status: DNR  Family Communication:  Level of care: Med-Surg Dispo:   The patient is from: home Anticipated d/c is to: SNF rehab Anticipated d/c date is: need to document compliance with dressing change for a few days and then re-submit insurance auth for SNF rehab.   Subjective and Interval History:  Pt accepted wound care and dressing changes  as ordered last night and today.  Reported legs feeling better when  wrapped.   Objective: Vitals:   01/18/24 1921 01/19/24 0339 01/19/24 0731 01/19/24 1559  BP: (!) 134/59 (!) 145/54 133/83 131/71  Pulse: 62 61 81 60  Resp: 18 18 16 16   Temp: 98.1 F (36.7 C) 98.4 F (36.9 C) 98.1 F (36.7 C) (!) 97.5 F (36.4 C)  TempSrc:      SpO2: 100% 100% 90% 100%  Weight:  54 kg    Height:        Intake/Output Summary (Last 24 hours) at 01/19/2024 1756 Last data filed at 01/19/2024 0500 Gross per 24 hour  Intake --  Output 400 ml  Net -400 ml   Filed Weights   01/09/24 2145 01/18/24 0600 01/19/24 0339  Weight: 57 kg 58.2 kg 54 kg    Examination:   Constitutional: NAD, AAOx3 HEENT: conjunctivae and lids normal, EOMI CV: No cyanosis.   RESP: normal respiratory effort Extremities: both lower legs wrapped in thin gauze SKIN: warm, dry Neuro: II - XII grossly intact.     Data Reviewed: I have personally reviewed labs and imaging studies  Time spent: 35 minutes  [name] Haber, MD [facility] If 7PM-7AM, please contact night-coverage 01/19/2024, 5:56 PM

## 2024-01-20 ENCOUNTER — Other Ambulatory Visit: Payer: Self-pay

## 2024-01-20 DIAGNOSIS — S81802A Unspecified open wound, left lower leg, initial encounter: Secondary | ICD-10-CM | POA: Diagnosis not present

## 2024-01-20 DIAGNOSIS — D3A8 Other benign neuroendocrine tumors: Secondary | ICD-10-CM

## 2024-01-20 DIAGNOSIS — N184 Chronic kidney disease, stage 4 (severe): Secondary | ICD-10-CM | POA: Diagnosis not present

## 2024-01-20 DIAGNOSIS — I5032 Chronic diastolic (congestive) heart failure: Secondary | ICD-10-CM | POA: Diagnosis not present

## 2024-01-20 LAB — BASIC METABOLIC PANEL WITH GFR
Anion gap: 12 (ref 5–15)
BUN: 67 mg/dL — ABNORMAL HIGH (ref 8–23)
CO2: 27 mmol/L (ref 22–32)
Calcium: 9.5 mg/dL (ref 8.9–10.3)
Chloride: 96 mmol/L — ABNORMAL LOW (ref 98–111)
Creatinine, Ser: 2.24 mg/dL — ABNORMAL HIGH (ref 0.44–1.00)
GFR, Estimated: 21 mL/min — ABNORMAL LOW (ref >60)
Glucose, Bld: 78 mg/dL (ref 70–99)
Potassium: 4.8 mmol/L (ref 3.5–5.1)
Sodium: 135 mmol/L (ref 135–145)

## 2024-01-20 NOTE — Progress Notes (Signed)
 Nutrition Follow-up  DOCUMENTATION CODES:   Severe malnutrition in context of chronic illness  INTERVENTION:   -Continue 2 gram sodium diet -D/c 30 ml Prosource Plus BID, each supplement provides 100 kcals and 15 grams protein -Continue Boost Breeze po TID, each supplement provides 250 kcal and 9 grams of protein  -Continue MVI with minerals daily -Continue 500 mg vitamin C  BID -Continue 220 mg zinc  sulfate daily x 14 days  NUTRITION DIAGNOSIS:   Severe Malnutrition related to chronic illness as evidenced by moderate fat depletion, severe fat depletion, moderate muscle depletion, severe muscle depletion, edema, percent weight loss.  Ongoing  GOAL:   Patient will meet greater than or equal to 90% of their needs  Progressing   MONITOR:   PO intake, Supplement acceptance  REASON FOR ASSESSMENT:   Malnutrition Screening Tool    ASSESSMENT:   Pt with medical history significant of pancreatic neuroendocrine tumor on chemotherapy, peripheral vascular disease, heart failure with preserved ejection fraction, chronic kidney failure stage III, history of nephrectomy, pulmonary hypertension, chronic stasis dermatitis, essential hypertension, COPD, atrial fibrillation, squamous cell carcinoma of the skin, hypertrophic cardiomyopathy, hyperlipidemia, normocytic anemia, on chronic oxygen  at home, chronic cellulitis of lower extremity and left lower extremity wound, follows at wound care center and Dermatologist  Reviewed I/O's: -300 ml x 24 hours and -7.4 L since admission  UOP: 300 ml x 24 hours  Pt sleeping soundly at time of visit. No family available at time of visit. Pt did not arouse to name being called. Observed breakfast tray. Pt consumed 100% of juice and a few bites of egg. Per RN, pt refusing Prosource, but will drink Boost Breeze. Meal completion variable; PO 0-100%, averaging around 50% of meals.   Pt more amenable to wound care after care plans changes made. Pt accepted  wound care and dressing changes yesterday.   Noted weight loss since admission, likely related to fluid changes. -7.4 L since admission.   Medications reviewed and include demadex .   Per TOC notes, plan for SNF placement once authorization is obtained.   Labs reviewed. Vitamin A  WDL.   Diet Order:   Diet Order             Diet 2 gram sodium Fluid consistency: Thin  Diet effective now                   EDUCATION NEEDS:   Education needs have been addressed  Skin:  Skin Assessment: Skin Integrity Issues: Skin Integrity Issues:: Stage III Stage III: rt anterior heel, lt anterior foot, lt pretibial  Last BM:  01/14/24  Height:   Ht Readings from Last 1 Encounters:  01/09/24 5' 6 (1.676 m)    Weight:   Wt Readings from Last 1 Encounters:  01/20/24 53.2 kg    Ideal Body Weight:  59.1 kg  BMI:  Body mass index is 18.93 kg/m.  Estimated Nutritional Needs:   Kcal:  1700-1900  Protein:  90-105 grams  Fluid:  1.7-1.9 L    Margery ORN, RD, LDN, CDCES Registered Dietitian III Certified Diabetes Care and Education Specialist If unable to reach this RD, please use RD Inpatient group chat on secure chat between hours of 8am-4 pm daily

## 2024-01-20 NOTE — Progress Notes (Signed)
 Mobility Specialist - Progress Note    01/20/24 1000  Mobility  Activity Ambulated with assistance;Stood at bedside  Level of Assistance Minimal assist, patient does 75% or more  Assistive Device Front wheel walker  Distance Ambulated (ft) 2 ft  Range of Motion/Exercises Active  Activity Response Tolerated well  Mobility Referral Yes  Mobility visit 1 Mobility  Mobility Specialist Start Time (ACUTE ONLY) [id number]  Mobility Specialist Stop Time (ACUTE ONLY) 1021  Mobility Specialist Time Calculation (min) (ACUTE ONLY) 25 min   Pt resting in bed on RA upon entry. Pt agreeable with encouragement. Pt STS x3 MinA with RW bedding and underwear changed. Pt performed assisted hygiene clean instanding. Pt returned to bed and left with needs in reach and bed alarm activated.   Guido Rumble Mobility Specialist 01/20/24, 10:51 AM

## 2024-01-20 NOTE — TOC Progression Note (Signed)
 Transition of Care [facility]) - Progression Note    Patient Details  Name: [name] MRN: [id number] Date of Birth: 08/22/1933  Transition of [facility]) CM/SW Contact  Alvaro Louder, [place] Phone Number: 01/20/2024, 4:23 PM  Clinical Narrative:  IS[place]EL restarted insurance auth through [facility] for patient to admit to SNF [facility]. Patient was initially declined due to being non compliant with Nursing. Patient has since then been compliant.   TOC to follow for discharge.                      Expected Discharge Plan and Services                                               Social Drivers of Health (SDOH) Interventions SDOH Screenings   Food Insecurity: No Food Insecurity (01/09/2024)  Housing: Low Risk  (01/09/2024)  Transportation Needs: No Transportation Needs (01/09/2024)  Utilities: Not At Risk (01/09/2024)  Alcohol Screen: Low Risk  (11/06/2023)  Depression (PHQ2-9): Low Risk  (12/08/2023)  Financial Resource Strain: Low Risk  (11/06/2023)  Physical Activity: Insufficiently Active (11/06/2023)  Social Connections: Socially Isolated (01/09/2024)  Stress: No Stress Concern Present (11/06/2023)  Tobacco Use: Medium Risk (01/09/2024)  Health Literacy: Adequate Health Literacy (11/06/2023)    Readmission Risk Interventions    06/17/2023   10:07 AM 01/31/2023    3:53 PM 11/09/2022   12:00 PM  Readmission Risk Prevention Plan  Transportation Screening Complete Complete Complete  PCP or Specialist Appt within 3-5 Days   Complete  HRI or Home Care Consult  Complete Complete  Social Work Consult for Recovery Care Planning/Counseling   Complete  Palliative Care Screening  Not Applicable   Medication Review Oceanographer)  Complete Complete  HRI or Home Care Consult Complete    SW Recovery Care/Counseling Consult Complete    Palliative Care Screening Not Applicable    Skilled Nursing Facility Not Applicable

## 2024-01-20 NOTE — Progress Notes (Signed)
 PROGRESS NOTE    [name]  FMW:[id number] DOB: 02/14/1934 DOA: 01/09/2024 PCP: [name], MD  132A/132A-AA  LOS: 11 days   Brief hospital course:   Assessment & Plan: [name] is a 89 y.o. 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 Patient has 2 lower extremity ulcers, poor healing.  Has been on antibiotics, cultures growing MRSA and Corynebacterium, was sent to the hospital for IV antibiotics.    Infected left lower extremity wound with surrounding cellulitis - Patient has outpatient culture apparently growing MRSA and corynebacterium - XR left tibia/fibula large soft tissue defect within the distal left lower leg unchanged, persistent diffuse subcutaneous edema.  No acute/destructive bony abnormalities - US  negative for DVT bilaterally, US  ABI right ABI 0.78, left could not be evaluated - CT with no bony destruction - Seen by WOC, recommend debridement - Seen by vascular surgery, intraoperative debridement would be appropriate, unclear etiology - Discussed with oncology, pt will not be able to do debridement for 4 to 6 weeks as patient has been on Avastin .  If there is plan for intervention, we will have to hold Avastin  and anemia will get worse - Seen by orthopedics, appreciate recs.  No signs of bone involvement - Seen by ID.  Pt completed 10 days of linezolid  --emphasized with pt that wounds will not heal without it being covered and kept  moist. --modified dressing change per order (RN to ask to document compliance)   Hypokalemia - resolved --supplement PRN   AKI on CKD 3b - Cr improved s/p IV fluids, Baseline Cr 1.7-1.9 --Cr went up to 2.41 on 9/12.  home torsemide  and metolazone  held.  Ordered gentle MIVF.   --daughter is extremely concerned about holding diuretics and would like cardio to be involved. --consulted cardio to manage pt's diuretics --home torsemide  resumed on 9/15 by cardio  Normocytic anemia - Monitor Hb   Chronic diastolic heart failure Hypertrophic cardiomyopathy Status post ICD placement.   Pulmonary HTN --daughter is extremely concerned about holding diuretics and would like cardio to be involved. --consulted cardio to manage pt's diuretics --home torsemide  resumed on 9/15 by cardio   COPD with chronic hypoxic respiratory failure On home oxygen  2L [place] --stable   Hx pancreatic neuroendocrine cancer Patient is on chemotherapy.   Continue follow-up with oncologist outpatient   Hyperlipidemia:  --cont statin   CAD:  --cont statin   Peripheral vascular disease:  --cont statin  Hyponatremia --has hx of intermittent hyponatremia --monitor  Severe malnutrition in context of chronic illness  --supplements per dietician  Permanent Afib --not on anticoagulation due to GI bleeds --rate controlled on no nodal agents   DVT prophylaxis: Lovenox  SQ Code Status: DNR  Family Communication:  Level of care: Med-Surg Dispo:   The patient is from: home Anticipated d/c is to: SNF rehab.  Insurance auth re-submitted on 9/16 after pt demonstrated compliance with wound care and dressing change.  Anticipated d/c date is: whenever SNF accepts   Subjective and Interval History:  Today is day 4 of compliance with wound care and dressing change.  Requested TOC to re-submit insurance auth for  SNF rehab.   Objective: Vitals:   01/20/24 0808 01/20/24 0931 01/20/24 1645 01/20/24 1945  BP: (!)  132/54  133/83 (!) 110/43  Pulse: (!) 59  60 62  Resp: 16  16 16   Temp: 98.7 F (37.1 C)  98.6 F (37 C) 98.4 F (36.9 C)  TempSrc: Oral  Oral Oral  SpO2: 100%  99% 100%  Weight:  53.2 kg    Height:        Intake/Output Summary (Last 24 hours) at 01/20/2024 2002 Last data filed at 01/20/2024 1900 Gross per 24 hour  Intake 480 ml  Output 600 ml  Net -120 ml   Filed Weights   01/18/24 0600 01/19/24 0339 01/20/24 0931  Weight: 58.2 kg 54 kg 53.2 kg    Examination:   Constitutional: NAD, AAOx3 HEENT: conjunctivae and lids normal, EOMI CV: No cyanosis.   RESP: normal respiratory effort, on RA Extremities: both lower legs wrapped in gauze SKIN: warm, dry Neuro: II - XII grossly intact.   Psych: Normal mood and affect.     Data Reviewed: I have personally reviewed labs and imaging studies  Time spent: 35 minutes  [name] Haber, MD [facility] If 7PM-7AM, please contact night-coverage 01/20/2024, 8:02 PM

## 2024-01-20 NOTE — Progress Notes (Signed)
 Advanced Heart Failure Rounding Note  Cardiologist: Evalene Lunger, MD  Chief Complaint: CHF Subjective:    Creatinine higher today, 1.96 => 2.24.  Leg edema well-controlled. Getting linezolid .    Objective:   Weight Range: 54 kg Body mass index is 19.21 kg/m.   Vital Signs:   Temp:  [97.5 F (36.4 C)-98.7 F (37.1 C)] 98.7 F (37.1 C) (09/16 0808) Pulse Rate:  [59-64] 59 (09/16 0808) Resp:  [16-18] 16 (09/16 0808) BP: (131-143)/(54-75) 132/54 (09/16 0808) SpO2:  [100 %] 100 % (09/16 0808) Last BM Date : 01/19/24  Weight change: Filed Weights   01/09/24 2145 01/18/24 0600 01/19/24 0339  Weight: 57 kg 58.2 kg 54 kg    Intake/Output:   Intake/Output Summary (Last 24 hours) at 01/20/2024 0839 Last data filed at 01/19/2024 1900 Gross per 24 hour  Intake 0 ml  Output 300 ml  Net -300 ml      Physical Exam    General:  Well appearing. No resp difficulty HEENT: Normal Neck: Supple. JVP 9-10 cm with prominent CV waves. Carotids 2+ bilat; no bruits. No lymphadenopathy or thyromegaly appreciated. Cor: PMI nondisplaced. Regular rate & rhythm. 2/6 SEM RUSB Lungs: Clear Abdomen: Soft, nontender, nondistended. No hepatosplenomegaly. No bruits or masses. Good bowel sounds. Extremities: Lower legs wrapped with minimal edema.  Neuro: Alert & orientedx3, cranial nerves grossly intact. moves all 4 extremities w/o difficulty. Affect pleasant   Telemetry   Not on telemetry.    Labs    CBC No results for input(s): WBC, NEUTROABS, HGB, HCT, MCV, PLT in the last 72 hours. Basic Metabolic Panel Recent Labs    90/84/74 0351 01/20/24 0607  NA 138 135  K 5.4* 4.8  CL 101 96*  CO2 27 27  GLUCOSE 74 78  BUN 53* 67*  CREATININE 1.96* 2.24*  CALCIUM  9.3 9.5   Liver Function Tests No results for input(s): AST, ALT, ALKPHOS, BILITOT, PROT, ALBUMIN  in the last 72 hours. No results for input(s): LIPASE, AMYLASE in the last 72 hours. Cardiac  Enzymes No results for input(s): CKTOTAL, CKMB, CKMBINDEX, TROPONINI in the last 72 hours.  BNP: BNP (last 3 results) Recent Labs    06/13/23 1552 06/24/23 1621 07/10/23 1641  BNP 349.6* 516.7* 322.2*    ProBNP (last 3 results) No results for input(s): PROBNP in the last 8760 hours.   D-Dimer No results for input(s): DDIMER in the last 72 hours. Hemoglobin A1C No results for input(s): HGBA1C in the last 72 hours. Fasting Lipid Panel No results for input(s): CHOL, HDL, LDLCALC, TRIG, CHOLHDL, LDLDIRECT in the last 72 hours. Thyroid  Function Tests No results for input(s): TSH, T4TOTAL, T3FREE, THYROIDAB in the last 72 hours.  Invalid input(s): FREET3  Other results:   Imaging    No results found.   Medications:     Scheduled Medications:  (feeding supplement) PROSource Plus  30 mL Oral TID BM   enoxaparin  (LOVENOX ) injection  30 mg Subcutaneous Q24H   feeding supplement  1 Container Oral TID BM   leptospermum manuka honey  1 Application Topical Daily   multivitamin with minerals  1 tablet Oral Daily   rosuvastatin   10 mg Oral Daily   torsemide   100 mg Oral BID   zinc  sulfate (50mg  elemental zinc )  220 mg Oral Daily    Infusions:   PRN Medications: acetaminophen , magic mouthwash w/lidocaine , ondansetron  **OR** ondansetron  (ZOFRAN ) IV, prochlorperazine , traMADol      Assessment/Plan   1. Chronic diastolic CHF: With prominent  RV failure. She had remote nonischemic cardiomyopathy with recovery of EF, has [employer] ICD. Atrial fibrillation likely contributes, but this appears to be permanent now as she has failed multiple anti-arrhythmics and is reasonably rate-controlled. RHC 11/20 showed elevated filling pressures and pulmonary venous hypertension. Prominent v-waves on PCWP tracing likely due to stiff ventricle/diastolic dysfunction as she had only mild MR on echo. She is RV pacing at a high percentage, but LV EF has remained  normal. Most recent echo in 5/25 showed EF 60-65%, mild LVH, d-shaped interventricular septum, severe RV enlargement with moderately decreased RV systolic function, PASP 96 mmHg, severe TR, mild MR with moderate MS (mean gradient 5 mmHg), moderate AS, IVC dilated. Patient has prominent JVD but this is in the setting of severe TR.  Diuretics were on hold then restarted.  Creatinine 1.96 => 2.24 (her baseline recently has been 2-2.3).  - I will keep her on her home torsemide  dose 100 mg bid for now but will not restart her metolazone  (had been on three days/week).  Will need to follow her creatinine trend closely, continue current torsemide  if creatinine stabilizes.  - Off SGLT2i after UTIs 2. Hypertension: BP not elevated.  3. Pulmonary hypertension:  Based on RHC 11/20, she has primarily pulmonary venous hypertension, not candidate for pulmonary vasodilators.  4. CKD: Stage 4. Off SGLT2i.    - As above, follow creatinine closely back on torsemide .  Keep same dose unless creatinine continues to rise.  5. Chronic anemia/GI bleeding: Due to small bowel AVMs. She has had transfusions and IV Fe, she was most recently admitted for this in 5/25.  - She is now off anticoagulation.  - Lovenox  DVT prophylaxis dose.  6. Atrial fibrillation: Permanent. Rate control is reasonable off nodal blockers. She has failed amiodarone , Tikosyn , and flecainide  as well as multiple cardioversions.  - Off Eliquis  with recurrent GI bleeding and transfusions.  - Not Watchman candidate due to frailty and poorly compensated CHF.  7. Neuroendocrine tumor of pancreatic head - Ongoing treatment per oncology.  8. Tricuspid regurgitation: Severe on 5/25 echo.   9. Mitral stenosis: Moderate on 5/25 echo.  - Follow, not surgical candidate.  10. Aortic stenosis: Moderate on 5/25 echo.  11. Cellulitis/chronic LE wounds: Suspect recurrent lower extremity cellulitis with MRSA and corynebacterium, now on linezolid .  No evidence for DVT or  osteomyelitis.   - Continue linezolid  per primary.  12. Disposition: Plan SNF. Would try to mobilize.      Length of Stay: 79  Ezra Shuck, MD  01/20/2024, 8:39 AM  Advanced Heart Failure Team Pager [phone] (M-F; 7a - 5p)  Please contact [facility] for night-coverage after hours (5p -7a ) and weekends on [email or web address]

## 2024-01-21 ENCOUNTER — Other Ambulatory Visit: Payer: Self-pay

## 2024-01-21 ENCOUNTER — Inpatient Hospital Stay

## 2024-01-21 ENCOUNTER — Other Ambulatory Visit

## 2024-01-21 DIAGNOSIS — I5032 Chronic diastolic (congestive) heart failure: Secondary | ICD-10-CM | POA: Diagnosis not present

## 2024-01-21 DIAGNOSIS — S81802A Unspecified open wound, left lower leg, initial encounter: Secondary | ICD-10-CM | POA: Diagnosis not present

## 2024-01-21 LAB — CBC
HCT: 33.1 % — ABNORMAL LOW (ref 36.0–46.0)
Hemoglobin: 10.2 g/dL — ABNORMAL LOW (ref 12.0–15.0)
MCH: 24.5 pg — ABNORMAL LOW (ref 26.0–34.0)
MCHC: 30.8 g/dL (ref 30.0–36.0)
MCV: 79.4 fL — ABNORMAL LOW (ref 80.0–100.0)
Platelets: 79 K/uL — ABNORMAL LOW (ref 150–400)
RBC: 4.17 MIL/uL (ref 3.87–5.11)
RDW: 19.3 % — ABNORMAL HIGH (ref 11.5–15.5)
WBC: 3.5 K/uL — ABNORMAL LOW (ref 4.0–10.5)
nRBC: 0 % (ref 0.0–0.2)

## 2024-01-21 LAB — BASIC METABOLIC PANEL WITH GFR
Anion gap: 17 — ABNORMAL HIGH (ref 5–15)
BUN: 72 mg/dL — ABNORMAL HIGH (ref 8–23)
CO2: 28 mmol/L (ref 22–32)
Calcium: 9.4 mg/dL (ref 8.9–10.3)
Chloride: 92 mmol/L — ABNORMAL LOW (ref 98–111)
Creatinine, Ser: 2.28 mg/dL — ABNORMAL HIGH (ref 0.44–1.00)
GFR, Estimated: 20 mL/min — ABNORMAL LOW (ref >60)
Glucose, Bld: 104 mg/dL — ABNORMAL HIGH (ref 70–99)
Potassium: 4.2 mmol/L (ref 3.5–5.1)
Sodium: 137 mmol/L (ref 135–145)

## 2024-01-21 MED ORDER — TORSEMIDE 20 MG PO TABS
100.0000 mg | ORAL_TABLET | Freq: Every day | ORAL | Status: DC
  Administered 2024-01-22 – 2024-01-24 (×3): 100 mg via ORAL
  Filled 2024-01-21 (×3): qty 5

## 2024-01-21 MED ORDER — IRON SUCROSE 300 MG IVPB - SIMPLE MED
300.0000 mg | Freq: Once | Status: AC
  Administered 2024-01-21: 300 mg via INTRAVENOUS
  Filled 2024-01-21: qty 300

## 2024-01-21 NOTE — Plan of Care (Signed)
   Problem: Health Behavior/Discharge Planning: Goal: Ability to manage health-related needs will improve Outcome: Progressing

## 2024-01-21 NOTE — TOC Progression Note (Signed)
 Transition of Care [facility]) - Progression Note    Patient Details  Name: [name] MRN: [id number] Date of Birth: Aug 14, 1933  Transition of Care [facility]) CM/SW Contact  Alvaro Louder, [place] Phone Number: 01/21/2024, 8:55 AM  Clinical Narrative:   LCSWA received insurance auth from [facility] advantage. Auth# [id number], [facility] admissions coordinator indicated that they will not have a bed until tomorrow. Plan for patient to admit tomorrow when medically ready.   TOC to follow for discharge.                       Expected Discharge Plan and Services                                               Social Drivers of Health (SDOH) Interventions SDOH Screenings   Food Insecurity: No Food Insecurity (01/09/2024)  Housing: Low Risk  (01/09/2024)  Transportation Needs: No Transportation Needs (01/09/2024)  Utilities: Not At Risk (01/09/2024)  Alcohol Screen: Low Risk  (11/06/2023)  Depression (PHQ2-9): Low Risk  (12/08/2023)  Financial Resource Strain: Low Risk  (11/06/2023)  Physical Activity: Insufficiently Active (11/06/2023)  Social Connections: Socially Isolated (01/09/2024)  Stress: No Stress Concern Present (11/06/2023)  Tobacco Use: Medium Risk (01/09/2024)  Health Literacy: Adequate Health Literacy (11/06/2023)    Readmission Risk Interventions    06/17/2023   10:07 AM 01/31/2023    3:53 PM 11/09/2022   12:00 PM  Readmission Risk Prevention Plan  Transportation Screening Complete Complete Complete  PCP or Specialist Appt within 3-5 Days   Complete  HRI or Home Care Consult  Complete Complete  Social Work Consult for Recovery Care Planning/Counseling   Complete  Palliative Care Screening  Not Applicable   Medication Review Oceanographer)  Complete Complete  HRI or Home Care Consult Complete    SW Recovery Care/Counseling Consult Complete    Palliative Care Screening Not Applicable    Skilled Nursing Facility Not Applicable

## 2024-01-21 NOTE — Progress Notes (Signed)
 Occupational Therapy Treatment Patient Details Name: [name] MRN: [id number] DOB: 01-18-1934 Today's Date: 01/21/2024   History of present illness Pt is a 89 y/o F presenting to ED with c/o MRSA in LLE, BLE wounds. Imaging negative for DVT, osteomyelitis. PMH significant for PVD, pancreatic CA on chemotherapy, HRpEF, CKD-III, hx of nephrectomy, squamous cell carcinoma, HLD, pacemaker, COPD with chronic O2 use at home.   OT comments  Pt seen for OT treatment on this date. Upon arrival to room pt sitting up in bed with RN at bedside, agreeable to tx. Pt completed bed mobility with CGA, no physical assistance to come to sitting on the EOB. Pt STS from the EOB with good carryover of STS technique + RW. Pt amb around bed with RW, MIN verbal cues for DME management. Pt retired in Dance movement psychotherapist with setupA for seated grooming tasks with all needs in reach. Pt on 2L oxygen , spo2 levels <90% pre-post mobility. Pt making good progress toward goals, will continue to follow POC. Discharge recommendation remains appropriate.        If plan is discharge home, recommend the following:  A little help with walking and/or transfers;A lot of help with bathing/dressing/bathroom   Equipment Recommendations  BSC/3in1    Recommendations for Other Services      Precautions / Restrictions Precautions Precautions: Fall;ICD/Pacemaker Recall of Precautions/Restrictions: Impaired Precaution/Restrictions Comments: pacemaker, BLE wounds Restrictions Weight Bearing Restrictions Per Provider Order: No       Mobility Bed Mobility Overal bed mobility: Needs Assistance Bed Mobility: Supine to Sit     Supine to sit: Contact guard, Used rails     General bed mobility comments: No physical assistance to come to sitting on the EOB    Transfers Overall transfer level: Needs assistance Equipment used: Rolling walker (2 wheels) Transfers: Bed to chair/wheelchair/BSC Sit to Stand: Contact guard assist            General transfer comment: Pt verbalized a mild dizziness on initial STS, pt reported this is her normal and it will go away.     Balance Overall balance assessment: Needs assistance Sitting-balance support: Feet unsupported, No upper extremity supported Sitting balance-Leahy Scale: Good Sitting balance - Comments: Steady reaching outside BOS   Standing balance support: During functional activity, Bilateral upper extremity supported Standing balance-Leahy Scale: Fair Standing balance comment: Verbal cues for safety and DME management                           ADL either performed or assessed with clinical judgement   ADL Overall ADL's : Needs assistance/impaired Eating/Feeding: Set up;Sitting   Grooming: Set up;Wash/dry face;Wash/dry hands;Oral care;Sitting               Lower Body Dressing: Set up;Sit to/from stand Lower Body Dressing Details (indicate cue type and reason): Slipping on bilateral slippers Toilet Transfer: Ambulation;Rolling walker (2 wheels);Cueing for safety;Contact guard assist Toilet Transfer Details (indicate cue type and reason): Simulated toilet transfer with use of RW         Functional mobility during ADLs: Contact guard assist;Rolling walker (2 wheels) General ADL Comments: CGA simulated toilet t/f, setupA seated grooming tasks     Communication Communication Communication: No apparent difficulties   Cognition Arousal: Alert Behavior During Therapy: WFL for tasks assessed/performed Cognition: No family/caregiver present to determine baseline  Following commands: Intact Following commands impaired: Follows multi-step commands with increased time      Cueing   Cueing Techniques: Verbal cues, Visual cues  Exercises Exercises: Other exercises Other Exercises Other Exercises: Edu: Role of OT session, safe ADL completion with use of RW, STS technique           General  Comments Bilateral LE wrapped, RN in room to verbalize to pt that rewrapping of bandages will occur this afternoon    Pertinent Vitals/ Pain       Pain Assessment Pain Assessment: Faces Faces Pain Scale: Hurts a little bit Pain Location: BLE Pain Descriptors / Indicators: Grimacing Pain Intervention(s): Limited activity within patient's tolerance, Monitored during session                                                          Frequency  Min 2X/week        Progress Toward Goals  OT Goals(current goals can now be found in the care plan section)  Progress towards OT goals: Progressing toward goals  Acute Rehab OT Goals OT Goal Formulation: With patient Time For Goal Achievement: 01/29/24 Potential to Achieve Goals: Good ADL Goals Pt Will Perform Grooming: with modified independence;standing Pt Will Perform Lower Body Dressing: sit to/from stand;with supervision Pt Will Transfer to Toilet: with modified independence;ambulating;regular height toilet   AM-PAC OT 6 Clicks Daily Activity     Outcome Measure   Help from another person eating meals?: None Help from another person taking care of personal grooming?: A Little Help from another person toileting, which includes using toliet, bedpan, or urinal?: A Little Help from another person bathing (including washing, rinsing, drying)?: A Lot Help from another person to put on and taking off regular upper body clothing?: A Little Help from another person to put on and taking off regular lower body clothing?: A Lot 6 Click Score: 17    End of Session Equipment Utilized During Treatment: Gait belt;Rolling walker (2 wheels);Oxygen   OT Visit Diagnosis: Unsteadiness on feet (R26.81)   Activity Tolerance Patient tolerated treatment well   Patient Left in chair;with call bell/phone within reach;with chair alarm set   Nurse Communication Mobility status        Time: 1030-1047 OT Time Calculation  (min): 17 min  Charges: OT General Charges $OT Visit: 1 Visit OT Treatments $Therapeutic Activity: 8-22 mins  Larraine Colas M.S. OTR/L  01/21/24, 11:04 AM

## 2024-01-21 NOTE — Progress Notes (Addendum)
 PROGRESS NOTE    [name]  FMW:[id number] DOB: 1934-02-03 DOA: 01/09/2024 PCP: [name], MD  132A/132A-AA  LOS: 12 days   Brief hospital course:   Assessment & Plan: [name] is a 89 y.o. 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 Patient has 2 lower extremity ulcers, poor healing.  Has been on antibiotics, cultures growing MRSA and Corynebacterium, was sent to the hospital for IV antibiotics.    Infected left lower extremity wound with surrounding cellulitis - outpatient culture apparently growing MRSA and corynebacterium - No evidence of DVT or osteomyelitis - Seen by WOC, recommend debridement - Seen by vascular surgery, intraoperative debridement would be appropriate, unclear etiology - Discussed with oncology, pt will not be able to do debridement for 4 to 6 weeks as patient has been on Avastin .  If there is plan for intervention, we will have to hold Avastin  and anemia will get worse - Seen by orthopedics, appreciate recs.  No signs of bone involvement - Seen by ID.  Pt completed 10 days of linezolid  --emphasized with pt that wounds will not heal without it being covered and kept moist. --modified dressing change per order (RN to ask to document compliance)   Hypokalemia - resolved --supplement PRN   AKI on CKD 3b - Cr improved s/p IV fluids, Baseline Cr 1.7-1.9 --Cr went up to 2.41 on 9/12.  home torsemide  and metolazone  held.  IV  fluids were ordered.   --daughter is extremely concerned about holding diuretics and would like cardio to be involved. --consulted cardio to manage pt's diuretics --home torsemide  resumed on 9/15 by cardio -- Torsemide  dose reduced 9/17 given worsening serum creatinine  Pancytopenia  Normocytic anemia Hemoglobin stable Continue iron  infusions Will continue to monitor blood counts   Chronic diastolic heart failure Hypertrophic cardiomyopathy Status post ICD placement.   Pulmonary HTN --consulted cardio to manage pt's diuretics --home torsemide  resumed on 9/15 by cardio   COPD with chronic hypoxic respiratory failure On home oxygen  2L  --stable   Hx pancreatic neuroendocrine cancer Patient is on chemotherapy.   Continue follow-up with oncologist outpatient   Hyperlipidemia:  --cont statin   CAD:  --cont statin   Peripheral vascular disease:  --cont statin  Hyponatremia Resolved  Severe malnutrition in context of chronic illness  --supplements per dietician  Permanent Afib --not on anticoagulation due to GI bleeds --rate controlled not no nodal agents   DVT prophylaxis: Lovenox  SQ Code Status: DNR  Family Communication:  Level of care: Med-Surg Dispo:   The patient is from: home Anticipated d/c is to: Insurance approved SNF, will DC tomorrow  Anticipated d/c date is: Tomorrow   Subjective and Interval History:  No issues this a.m.  Objective: Vitals:   01/21/24 0423 01/21/24 0500 01/21/24 0848 01/21/24 1459  BP: (!) 139/50  138/87 (!) 130/92  Pulse: (!) 59  (!) 58 60  Resp: 16  17 17   Temp: 98.8 F (37.1 C)  98.7 F (37.1 C) 97.7 F (36.5 C)  TempSrc: Oral  Oral Oral  SpO2: 100%  100% 99%  Weight:  53.4 kg  Height:        Intake/Output Summary (Last 24 hours) at 01/21/2024 1630 Last data filed at 01/21/2024 0900 Gross per 24 hour  Intake 360 ml  Output 700 ml  Net -340 ml   Filed Weights   01/19/24 0339 01/20/24 0931 01/21/24 0500   Weight: 54 kg 53.2 kg 53.4 kg    Examination:     Constitutional: In no distress.  Cardiovascular: Irregularly irregular Pulmonary: Non labored breathing on room air, no wheezing or rales.   Abdominal: Soft. Non distended and non tender Musculoskeletal: Normal range of motion.     Neurological: Alert and oriented to person, place, and time. Non focal  Extremities: BLE with gauze c/d/I   Data Reviewed: I have personally reviewed labs and imaging studies  Time spent: 35 minutes  Alban Pepper, MD [facility] If 7PM-7AM, please contact night-coverage 01/21/2024, 4:30 PM

## 2024-01-21 NOTE — Progress Notes (Signed)
 Physical Therapy Treatment Patient Details Name: [name] MRN: [id number] DOB: [birth year] Today's Date: 01/21/2024   History of Present Illness Pt is a 89 y/o F presenting to ED with c/o MRSA in LLE, BLE wounds. Imaging negative for DVT, osteomyelitis. PMH significant for PVD, pancreatic CA on chemotherapy, HRpEF, CKD-III, hx of nephrectomy, squamous cell carcinoma, HLD, pacemaker, COPD with chronic O2 use at home.    PT Comments  Pt received up in chair, agreeable to PT session. Less c/o B Lower Leg pain this date with standing activities. Pt utilized RW for in room mobility with good safety awareness around tight spaces and objects. Remains slightly weak requiring assist to stand from toilet. Will continue to benefit from STR at d/c prior to returning home. Continue PT per POC, potential d/c tomorrow.    If plan is discharge home, recommend the following: A little help with walking and/or transfers;A little help with bathing/dressing/bathroom;Assistance with cooking/housework;Assist for transportation;Help with stairs or ramp for entrance   Can travel by private vehicle     Yes  Equipment Recommendations  Other (comment) (TBD)    Recommendations for Other Services       Precautions / Restrictions Precautions Precautions: Fall;ICD/Pacemaker Recall of Precautions/Restrictions: Impaired Precaution/Restrictions Comments: pacemaker, BLE wounds Restrictions Weight Bearing Restrictions Per Provider Order: No     Mobility  Bed Mobility Overal bed mobility: Needs Assistance Bed Mobility: Sit to Supine     Supine to sit: Contact guard, Used rails     General bed mobility comments: No physical assistance to come to sitting on the EOB    Transfers Overall transfer level: Needs assistance Equipment used: Rolling walker (2 wheels) Transfers: Sit to/from Stand Sit to Stand: Supervision           General transfer comment:  (MinA to raise from low toilet seat)     Ambulation/Gait Ambulation/Gait assistance: Supervision, Contact guard assist Gait Distance (Feet): 30 Feet Assistive device: Rolling walker (2 wheels) Gait Pattern/deviations: Step-through pattern Gait velocity: decreased     General Gait Details:  (No LOB with ambulation in room and navigating through tight spaces)   Stairs             Wheelchair Mobility     Tilt Bed    Modified Rankin (Stroke Patients Only)       Balance Overall balance assessment: Needs assistance Sitting-balance support: Feet unsupported, No upper extremity supported Sitting balance-Leahy Scale: Good Sitting balance - Comments: Steady reaching outside BOS   Standing balance support: During functional activity, Bilateral upper extremity supported Standing balance-Leahy Scale: Fair Standing balance comment: Verbal cues for safety and DME management                            Communication Communication Communication: No apparent difficulties  Cognition Arousal: Alert Behavior During Therapy: WFL for tasks assessed/performed                             Following commands: Intact Following commands impaired: Follows multi-step commands with increased time    Cueing Cueing Techniques: Verbal cues, Visual cues  Exercises General Exercises - Lower Extremity Ankle Circles/Pumps: AROM, Both, 5 reps Long Arc Quad: AROM, Both, 5 reps Other Exercises Other Exercises: Edu: Role of PT session, safe use of RW, STS technique and reducing fall risk    General Comments General comments (skin integrity, edema, etc.):  (B Lower  legs wrapped loosely, no drainage noted)      Pertinent Vitals/Pain Pain Assessment Pain Assessment: Faces Faces Pain Scale: Hurts a little bit Pain Location: BLE Pain Descriptors / Indicators: Grimacing Pain Intervention(s): Monitored during session    Home Living                          Prior Function            PT Goals  (current goals can now be found in the care plan section) Acute Rehab PT Goals Patient Stated Goal: to get better Progress towards PT goals: Progressing toward goals    Frequency    Min 2X/week      PT Plan      Co-evaluation              AM-PAC PT 6 Clicks Mobility   Outcome Measure  Help needed turning from your back to your side while in a flat bed without using bedrails?: A Little Help needed moving from lying on your back to sitting on the side of a flat bed without using bedrails?: A Little Help needed moving to and from a bed to a chair (including a wheelchair)?: A Little Help needed standing up from a chair using your arms (e.g., wheelchair or bedside chair)?: A Little Help needed to walk in hospital room?: A Little Help needed climbing 3-5 steps with a railing? : A Lot 6 Click Score: 17    End of Session Equipment Utilized During Treatment: Oxygen  Activity Tolerance: Patient tolerated treatment well Patient left: in bed;with call bell/phone within reach;with bed alarm set Nurse Communication: Mobility status PT Visit Diagnosis: Unsteadiness on feet (R26.81);Other abnormalities of gait and mobility (R26.89);Muscle weakness (generalized) (M62.81);Difficulty in walking, not elsewhere classified (R26.2);Pain Pain - part of body: Leg     Time: 1352-1416 PT Time Calculation (min) (ACUTE ONLY): 24 min  Charges:    $Therapeutic Exercise: 8-22 mins $Therapeutic Activity: 8-22 mins PT General Charges $$ ACUTE PT VISIT: 1 Visit                    Darice Bohr, PTA  Darice JAYSON Bohr 01/21/2024, 2:22 PM

## 2024-01-21 NOTE — Progress Notes (Signed)
 Patient ID: [name], female   DOB: 09/19/1933, 89 y.o.   MRN: [id number]     Advanced Heart Failure Rounding Note  Cardiologist: Evalene Lunger, MD  Chief Complaint: CHF Subjective:    Creatinine fairly stable with rise in BUN, 1.96 => 2.24 => 2.28.  Leg edema well-controlled. Getting linezolid .    Objective:   Weight Range: 53.4 kg Body mass index is 19 kg/m.   Vital Signs:   Temp:  [98.4 F (36.9 C)-98.8 F (37.1 C)] 98.7 F (37.1 C) (09/17 0848) Pulse Rate:  [58-62] 58 (09/17 0848) Resp:  [16-17] 17 (09/17 0848) BP: (110-139)/(43-87) 138/87 (09/17 0848) SpO2:  [99 %-100 %] 100 % (09/17 0848) Weight:  [53.4 kg] 53.4 kg (09/17 0500) Last BM Date : 01/21/24  Weight change: Filed Weights   01/19/24 0339 01/20/24 0931 01/21/24 0500  Weight: 54 kg 53.2 kg 53.4 kg    Intake/Output:   Intake/Output Summary (Last 24 hours) at 01/21/2024 1355 Last data filed at 01/21/2024 0900 Gross per 24 hour  Intake 360 ml  Output 1000 ml  Net -640 ml      Physical Exam    General: NAD Neck: JVP 7-8 cm, no thyromegaly or thyroid  nodule.  Lungs: Clear to auscultation bilaterally with normal respiratory effort. CV: Nondisplaced PMI.  Heart irregular S1/S2, no S3/S4, 2/6 SEM RUSB.  Lower legs wrapped.   Abdomen: Soft, nontender, no hepatosplenomegaly, no distention.  Skin: Intact without lesions or rashes.  Neurologic: Alert and oriented x 3.  Psych: Normal affect. Extremities: Chronic venous stasis changes lower legs.  HEENT: Normal.    Telemetry   Not on telemetry.    Labs    CBC Recent Labs    01/21/24 0508  WBC 3.5*  HGB 10.2*  HCT 33.1*  MCV 79.4*  PLT 79*   Basic Metabolic Panel Recent Labs    90/83/74 0607 01/21/24 0508  NA 135 137  K 4.8 4.2  CL 96* 92*  CO2 27 28  GLUCOSE 78 104*  BUN 67* 72*  CREATININE 2.24* 2.28*  CALCIUM  9.5 9.4   Liver Function Tests No results for input(s): AST, ALT, ALKPHOS, BILITOT, PROT, ALBUMIN   in the last 72 hours. No results for input(s): LIPASE, AMYLASE in the last 72 hours. Cardiac Enzymes No results for input(s): CKTOTAL, CKMB, CKMBINDEX, TROPONINI in the last 72 hours.  BNP: BNP (last 3 results) Recent Labs    06/13/23 1552 06/24/23 1621 07/10/23 1641  BNP 349.6* 516.7* 322.2*    ProBNP (last 3 results) No results for input(s): PROBNP in the last 8760 hours.   D-Dimer No results for input(s): DDIMER in the last 72 hours. Hemoglobin A1C No results for input(s): HGBA1C in the last 72 hours. Fasting Lipid Panel No results for input(s): CHOL, HDL, LDLCALC, TRIG, CHOLHDL, LDLDIRECT in the last 72 hours. Thyroid  Function Tests No results for input(s): TSH, T4TOTAL, T3FREE, THYROIDAB in the last 72 hours.  Invalid input(s): FREET3  Other results:   Imaging    No results found.   Medications:     Scheduled Medications:  enoxaparin  (LOVENOX ) injection  30 mg Subcutaneous Q24H   feeding supplement  1 Container Oral TID BM   leptospermum manuka honey  1 Application Topical Daily   multivitamin with minerals  1 tablet Oral Daily   rosuvastatin   10 mg Oral Daily   [START ON 01/22/2024] torsemide   100 mg Oral Daily   zinc  sulfate (50mg  elemental zinc )  220 mg Oral Daily  Infusions:   PRN Medications: acetaminophen , magic mouthwash w/lidocaine , ondansetron  **OR** ondansetron  (ZOFRAN ) IV, prochlorperazine , traMADol      Assessment/Plan   1. Chronic diastolic CHF: With prominent RV failure. She had remote nonischemic cardiomyopathy with recovery of EF, has Medtronic ICD. Atrial fibrillation likely contributes, but this appears to be permanent now as she has failed multiple anti-arrhythmics and is reasonably rate-controlled. RHC 11/20 showed elevated filling pressures and pulmonary venous hypertension. Prominent v-waves on PCWP tracing likely due to stiff ventricle/diastolic dysfunction as she had only mild MR on  echo. She is RV pacing at a high percentage, but LV EF has remained normal. Most recent echo in 5/25 showed EF 60-65%, mild LVH, d-shaped interventricular septum, severe RV enlargement with moderately decreased RV systolic function, PASP 96 mmHg, severe TR, mild MR with moderate MS (mean gradient 5 mmHg), moderate AS, IVC dilated. Patient has prominent JVD but this is in the setting of severe TR.  Diuretics were on hold then restarted.  Creatinine 1.96 => 2.24 => 2.28 (her baseline recently has been 2-2.3).  - With slow rise in BUN and creatinine, decrease torsemide  to 100 mg daily.  Hold off on metolazone  for now.  - Off SGLT2i after UTIs 2. Hypertension: BP not elevated.  3. Pulmonary hypertension:  Based on RHC 11/20, she has primarily pulmonary venous hypertension, not candidate for pulmonary vasodilators.  4. CKD: Stage 4. Off SGLT2i.    - As above, follow creatinine closely back on torsemide .  Keep same dose unless creatinine continues to rise.  5. Chronic anemia/GI bleeding: Due to small bowel AVMs. She has had transfusions and IV Fe, she was most recently admitted for this in 5/25.  - She is now off anticoagulation.  - Lovenox  DVT prophylaxis dose.  6. Atrial fibrillation: Permanent. Rate control is reasonable off nodal blockers. She has failed amiodarone , Tikosyn , and flecainide  as well as multiple cardioversions.  - Off Eliquis  with recurrent GI bleeding and transfusions.  - Not Watchman candidate due to frailty and poorly compensated CHF.  7. Neuroendocrine tumor of pancreatic head - Ongoing treatment per oncology.  8. Tricuspid regurgitation: Severe on 5/25 echo.   9. Mitral stenosis: Moderate on 5/25 echo.  - Follow, not surgical candidate.  10. Aortic stenosis: Moderate on 5/25 echo.  11. Cellulitis/chronic LE wounds: Suspect recurrent lower extremity cellulitis with MRSA and corynebacterium, now on linezolid .  No evidence for DVT or osteomyelitis.   - Continue linezolid  per  primary.  12. Disposition: Plan SNF. Would try to mobilize.     Length of Stay: 70  Ezra Shuck, MD  01/21/2024, 1:55 PM  Advanced Heart Failure Team Pager [phone] (M-F; 7a - 5p)  Please contact [facility] for night-coverage after hours (5p -7a ) and weekends on [email or web address]

## 2024-01-22 ENCOUNTER — Other Ambulatory Visit: Payer: Self-pay

## 2024-01-22 DIAGNOSIS — S81802D Unspecified open wound, left lower leg, subsequent encounter: Secondary | ICD-10-CM

## 2024-01-22 DIAGNOSIS — I5032 Chronic diastolic (congestive) heart failure: Secondary | ICD-10-CM | POA: Diagnosis not present

## 2024-01-22 LAB — CBC
HCT: 32.2 % — ABNORMAL LOW (ref 36.0–46.0)
Hemoglobin: 9.7 g/dL — ABNORMAL LOW (ref 12.0–15.0)
MCH: 23.9 pg — ABNORMAL LOW (ref 26.0–34.0)
MCHC: 30.1 g/dL (ref 30.0–36.0)
MCV: 79.3 fL — ABNORMAL LOW (ref 80.0–100.0)
Platelets: 54 K/uL — ABNORMAL LOW (ref 150–400)
RBC: 4.06 MIL/uL (ref 3.87–5.11)
RDW: 18.9 % — ABNORMAL HIGH (ref 11.5–15.5)
WBC: 2.8 K/uL — ABNORMAL LOW (ref 4.0–10.5)
nRBC: 0 % (ref 0.0–0.2)

## 2024-01-22 LAB — BASIC METABOLIC PANEL WITH GFR
Anion gap: 10 (ref 5–15)
BUN: 74 mg/dL — ABNORMAL HIGH (ref 8–23)
CO2: 31 mmol/L (ref 22–32)
Calcium: 9.3 mg/dL (ref 8.9–10.3)
Chloride: 95 mmol/L — ABNORMAL LOW (ref 98–111)
Creatinine, Ser: 2.05 mg/dL — ABNORMAL HIGH (ref 0.44–1.00)
GFR, Estimated: 23 mL/min — ABNORMAL LOW (ref >60)
Glucose, Bld: 96 mg/dL (ref 70–99)
Potassium: 3.9 mmol/L (ref 3.5–5.1)
Sodium: 136 mmol/L (ref 135–145)

## 2024-01-22 MED ORDER — POTASSIUM CHLORIDE ER 20 MEQ PO TBCR
20.0000 meq | EXTENDED_RELEASE_TABLET | Freq: Every day | ORAL | 2 refills | Status: DC
  Filled 2024-01-22: qty 30, 30d supply, fill #0

## 2024-01-22 MED ORDER — MEDIHONEY WOUND/BURN DRESSING EX PSTE: 1.0000 | PASTE | Freq: Every day | CUTANEOUS | Status: DC

## 2024-01-22 MED ORDER — ZINC SULFATE 220 (50 ZN) MG PO CAPS: 220.0000 mg | ORAL_CAPSULE | Freq: Every day | ORAL | Status: AC

## 2024-01-22 MED ORDER — TORSEMIDE 20 MG PO TABS: 100.0000 mg | ORAL_TABLET | Freq: Every day | ORAL | Status: DC

## 2024-01-22 NOTE — Progress Notes (Signed)
 PROGRESS NOTE    [name]  FMW:[id number] DOB: 10-17-1933 DOA: 01/09/2024 PCP: [name], MD  132A/132A-AA  LOS: 13 days   Brief hospital course:   Assessment & Plan: [name] is a 89 y.o. 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 Patient has 2 lower extremity ulcers, poor healing.  Has been on antibiotics, cultures growing MRSA and Corynebacterium, was sent to the hospital for IV antibiotics.    Infected left lower extremity wound with surrounding cellulitis - outpatient culture apparently growing MRSA and corynebacterium - No evidence of DVT or osteomyelitis - Seen by WOC, recommend debridement - Seen by vascular surgery, intraoperative debridement would be appropriate, unclear etiology - Discussed with oncology, pt will not be able to do debridement for 4 to 6 weeks as patient has been on Avastin .  If there is plan for intervention, we will have to hold Avastin  and anemia will get worse - Seen by orthopedics, appreciate recs.  No signs of bone involvement - Seen by ID.  Pt completed 10 days of linezolid   Hypokalemia - resolved --supplement PRN   AKI on CKD 3b --Cr went up to 2.41 on 9/12. --Appears to be downtrending towards baseline She is currently on torsemide  100 mg daily.   Pancytopenia  Normocytic anemia Patient's hemoglobin remains stable.  Her platelet count 159>79>54. Her WBC count is also down 2.8<3.5.  Unclear exact  etiology. Lovenox  was held and HIT ab sent.  No evidence of bleeding -Will check CBC in the AM, if blood counts are stable patient can discharge and have follow-up outpatient to ensure her blood counts improve  Chronic diastolic heart failure Hypertrophic cardiomyopathy Status post ICD placement.   Pulmonary HTN -- Continue torsemide  100 mg daily   COPD with chronic hypoxic respiratory failure On home oxygen  2L [name] --stable   Hx pancreatic neuroendocrine cancer Patient is on chemotherapy.   Continue follow-up with oncologist outpatient   Hyperlipidemia:  --cont statin   CAD:  --cont statin   Peripheral vascular disease:  --cont statin  Hyponatremia Resolved  Severe malnutrition in context of chronic illness  --supplements per dietician  Permanent Afib --not on anticoagulation due to GI bleeds --rate controlled not no nodal agents   DVT prophylaxis: Lovenox  SQ Code Status: DNR  Family Communication:  Level of care: Med-Surg Dispo:   The patient is from: home Anticipated d/c is to: Insurance approved SNF, will DC tomorrow  Anticipated d/c date is: Tomorrow   Subjective and Interval History:  No acute issues overnight  Objective: Vitals:   01/21/24 1459 01/22/24 0350 01/22/24 0500 01/22/24 0838  BP: (!) 130/92 (!) 126/51  (!) 140/59  Pulse: 60 (!) 59  (!) 59  Resp: 17 18  15   Temp: 97.7 F (36.5 C) 98.1 F (36.7 C)  98.6 F (37 C)  TempSrc: Oral   Oral  SpO2: 99% 92%  100%  Weight:   53.4 kg   Height:        Intake/Output Summary (Last 24 hours) at 01/22/2024 1719 Last data filed at 01/22/2024 1300 Gross per 24 hour  Intake 480 ml  Output  500 ml  Net -20 ml   Filed Weights   01/20/24 0931 01/21/24 0500 01/22/24 0500  Weight: 53.2 kg 53.4 kg 53.4 kg    Examination:   Constitutional: In no distress.  Cardiovascular: Irregularly irregular.  Pulmonary: Non labored breathing on room air, no wheezing or rales.   Abdominal: Soft. Non distended  and non tender Musculoskeletal: Normal range of motion.     Neurological: Alert and oriented to person, place, and time. Non focal  Skin: BLE with gauze dressing, c/d/I    Data Reviewed: I have personally reviewed labs and imaging studies     Latest Ref Rng & Units 01/22/2024   11:05 AM 01/21/2024    5:08 AM 01/16/2024    4:06 AM  CBC  WBC 4.0 - 10.5 K/uL 2.8  3.5  3.4   Hemoglobin 12.0 - 15.0 g/dL 9.7  89.7  9.6   Hematocrit 36.0 - 46.0 % 32.2  33.1  32.1   Platelets 150 - 400 K/uL 54  79  159     Time spent: 35 minutes  Alban Pepper, MD [facility] If 7PM-7AM, please contact night-coverage 01/22/2024, 5:19 PM

## 2024-01-22 NOTE — Discharge Instructions (Signed)
 Please keep your 9/22 appointment with your cardiology team.

## 2024-01-22 NOTE — Progress Notes (Signed)
 Patient ID: [name], female   DOB: 1934-04-11, 89 y.o.   MRN: [id number]     Advanced Heart Failure Rounding Note  Cardiologist: Evalene Lunger, MD  Chief Complaint: CHF Subjective:    Creatinine trend 1.96 => 2.24 => 2.28 => 2.05, torsemide  decreased yesterday.  Leg edema well-controlled. She has completed course of linezolid  for cellulitis.   Thinks she is leaving for rehab facility today.    Objective:   Weight Range: 53.4 kg Body mass index is 19 kg/m.   Vital Signs:   Temp:  [97.7 F (36.5 C)-98.6 F (37 C)] 98.6 F (37 C) (09/18 0838) Pulse Rate:  [59-60] 59 (09/18 0838) Resp:  [15-18] 15 (09/18 0838) BP: (126-140)/(51-92) 140/59 (09/18 0838) SpO2:  [92 %-100 %] 100 % (09/18 0838) Weight:  [53.4 kg] 53.4 kg (09/18 0500) Last BM Date : 01/21/24  Weight change: Filed Weights   01/20/24 0931 01/21/24 0500 01/22/24 0500  Weight: 53.2 kg 53.4 kg 53.4 kg    Intake/Output:   Intake/Output Summary (Last 24 hours) at 01/22/2024 1031 Last data filed at 01/22/2024 0859 Gross per 24 hour  Intake --  Output 500 ml  Net -500 ml      Physical Exam    General: NAD Neck: JVP 8 cm, no thyromegaly or thyroid  nodule.  Lungs: Clear to auscultation bilaterally with normal respiratory effort. CV: Nondisplaced PMI.  Heart regular S1/S2, no S3/S4, 2/6 SEM RUSB.  Trace ankle edema.  Abdomen: Soft, nontender, no hepatosplenomegaly, no distention.  Skin: Intact without lesions or rashes.  Neurologic: Alert and oriented x 3.  Psych: Normal affect. Extremities: Chronic venous stasis changes on legs.  HEENT: Normal.    Telemetry   Not on telemetry.    Labs    CBC Recent Labs    01/21/24 0508  WBC 3.5*  HGB 10.2*  HCT 33.1*  MCV 79.4*  PLT 79*   Basic Metabolic Panel Recent Labs    90/82/74 0508 01/22/24 0512  NA 137 136  K 4.2 3.9  CL 92* 95*  CO2 28 31  GLUCOSE 104* 96  BUN 72* 74*  CREATININE 2.28* 2.05*  CALCIUM  9.4 9.3   Liver Function  Tests No results for input(s): AST, ALT, ALKPHOS, BILITOT, PROT, ALBUMIN  in the last 72 hours. No results for input(s): LIPASE, AMYLASE in the last 72 hours. Cardiac Enzymes No results for input(s): CKTOTAL, CKMB, CKMBINDEX, TROPONINI in the last 72 hours.  BNP: BNP (last 3 results) Recent Labs    06/13/23 1552 06/24/23 1621 07/10/23 1641  BNP 349.6* 516.7* 322.2*    ProBNP (last 3 results) No results for input(s): PROBNP in the last 8760 hours.   D-Dimer No results for input(s): DDIMER in the last 72 hours. Hemoglobin A1C No results for input(s): HGBA1C in the last 72 hours. Fasting Lipid Panel No results for input(s): CHOL, HDL, LDLCALC, TRIG, CHOLHDL, LDLDIRECT in the last 72 hours. Thyroid  Function Tests No results for input(s): TSH, T4TOTAL, T3FREE, THYROIDAB in the last 72 hours.  Invalid input(s): FREET3  Other results:   Imaging    No results found.   Medications:     Scheduled Medications:  enoxaparin  (LOVENOX ) injection  30 mg Subcutaneous Q24H   feeding supplement  1 Container Oral TID BM   leptospermum manuka honey  1 Application Topical Daily   multivitamin with minerals  1 tablet Oral Daily   rosuvastatin   10 mg Oral Daily   torsemide   100 mg Oral Daily  zinc  sulfate (50mg  elemental zinc )  220 mg Oral Daily    Infusions:   PRN Medications: acetaminophen , magic mouthwash w/lidocaine , ondansetron  **OR** ondansetron  (ZOFRAN ) IV, prochlorperazine , traMADol      Assessment/Plan   1. Chronic diastolic CHF: With prominent RV failure. She had remote nonischemic cardiomyopathy with recovery of EF, has Medtronic ICD. Atrial fibrillation likely contributes, but this appears to be permanent now as she has failed multiple anti-arrhythmics and is reasonably rate-controlled. RHC 11/20 showed elevated filling pressures and pulmonary venous hypertension. Prominent v-waves on PCWP tracing likely due to  stiff ventricle/diastolic dysfunction as she had only mild MR on echo. She is RV pacing at a high percentage, but LV EF has remained normal. Most recent echo in 5/25 showed EF 60-65%, mild LVH, d-shaped interventricular septum, severe RV enlargement with moderately decreased RV systolic function, PASP 96 mmHg, severe TR, mild MR with moderate MS (mean gradient 5 mmHg), moderate AS, IVC dilated. Patient has prominent JVD but this is in the setting of severe TR.  Diuretics were on hold then restarted.  Creatinine 1.96 => 2.24 => 2.28 => 2.05 (her baseline recently has been 2-2.3).  - Continue torsemide  100 mg daily.  Hold off on metolazone  for now.  She can go to SNF on this regimen, will make close CHF clinic followup to make sure she does not build up significant fluid.  - Off SGLT2i after UTIs 2. Hypertension: BP not elevated.  3. Pulmonary hypertension:  Based on RHC 11/20, she has primarily pulmonary venous hypertension, not candidate for pulmonary vasodilators.  4. CKD: Stage 4. Off SGLT2i.    - As above, follow creatinine closely back on torsemide .  Keep same dose unless creatinine continues to rise.  5. Chronic anemia/GI bleeding: Due to small bowel AVMs. She has had transfusions and IV Fe, she was most recently admitted for this in 5/25.  - She is now off anticoagulation.  - Lovenox  DVT prophylaxis dose.  6. Atrial fibrillation: Permanent. Rate control is reasonable off nodal blockers. She has failed amiodarone , Tikosyn , and flecainide  as well as multiple cardioversions.  - Off Eliquis  with recurrent GI bleeding and transfusions.  - Not Watchman candidate due to frailty and poorly compensated CHF.  7. Neuroendocrine tumor of pancreatic head - Ongoing treatment per oncology.  8. Tricuspid regurgitation: Severe on 5/25 echo.   9. Mitral stenosis: Moderate on 5/25 echo.  - Follow, not surgical candidate.  10. Aortic stenosis: Moderate on 5/25 echo.  11. Cellulitis/chronic LE wounds: Suspect  recurrent lower extremity cellulitis with MRSA and corynebacterium, now on linezolid .  No evidence for DVT or osteomyelitis.  She has completed linezolid . .  12. Disposition: Plan SNF, possibly today.     Length of Stay: 48  Ezra Shuck, MD  01/22/2024, 10:31 AM  Advanced Heart Failure Team Pager [phone] (M-F; 7a - 5p)  Please contact [facility] for night-coverage after hours (5p -7a ) and weekends on [email or web address]

## 2024-01-22 NOTE — Discharge Summary (Signed)
 Physician Discharge Summary   Patient: [name] MRN: [id number] DOB: 01-Jan-1934  Admit date:     01/09/2024  Discharge date: 01/22/24  Discharge Physician: Alban Pepper   PCP: [name], MD   Recommendations at discharge:    Need blood counts checked outpatient with oncology to ensure stable.   Discharge Diagnoses: Principal Problem:   Leg wound, left, initial encounter Active Problems:   Cellulitis of left lower extremity   Acute on chronic diastolic CHF (congestive heart failure) (HCC)   Normocytic anemia   Primary pancreatic neuroendocrine tumor   Hyperlipidemia   HOCM/IHSS   Atrial fibrillation, permanent (HCC)   COPD (chronic obstructive pulmonary disease) (HCC)   Acute on chronic kidney failure (HCC)   ICD (implantable cardioverter-defibrillator) in place   Hypertension   PAD (peripheral artery disease) (HCC)   History of nephrectomy secondary to RCC   Pulmonary hypertension (HCC)   MRSA (methicillin resistant staph aureus) culture positive   Protein-calorie malnutrition, severe  Resolved Problems:   * No resolved hospital problems. *  Hospital Course: [name] is a 89 y.o. female with medical history significant of pancreatic neuroendocrine tumor on chemotherapy, peripheral vascular disease, heart failure with preserved ejection fraction, severe TR, chronic kidney failure stage IIIb4, history of nephrectomy, pulmonary hypertension, chronic stasis dermatitis, essential hypertension, COPD, atrial fibrillation, squamous cell carcinoma of the skin, hypertrophic cardiomyopathy, hyperlipidemia, normocytic anemia, on chronic oxygen  at home, chronic lower extremity wounds and left lower extremity wound, follows at wound care center and Dermatologist.   Patient has 2 lower extremity ulcers, poor healing.  Has been on antibiotics, cultures growing MRSA and Corynebacterium, was sent to the hospital for IV antibiotics.   Assessment and Plan: Infected  left lower extremity wound with surrounding cellulitis - outpatient culture apparently growing MRSA and corynebacterium - No evidence of DVT or osteomyelitis - Seen by WOC, recommend debridement - Seen by vascular surgery, debridement would be appropriate, - Discussed with oncology, pt will not be able to do debridement for 4 to 6 weeks as patient has been on Avastin .  If there is plan for intervention, Avastin  would need to be on hold and anemia would worsen - Seen by orthopedics, appreciate recs.  No signs of bone involvement - Seen by ID.  Status post 10 days of linezolid  -Will continue wound care for now  AKI on CKD 3b/4    Latest Ref Rng & Units 01/22/2024    5:12 AM 01/21/2024    5:08 AM 01/20/2024    6:07 AM  BMP  Glucose 70 - 99 mg/dL 96  895  78   BUN 8 - 23 mg/dL 74  72  67   Creatinine 0.44 - 1.00 mg/dL 7.94  7.71  7.75   Sodium 135 - 145 mmol/L 136  137  135   Potassium 3.5 - 5.1 mmol/L 3.9  4.2  4.8   Chloride 98 - 111 mmol/L 95  92  96   CO2 22 - 32 mmol/L 31  28  27    Calcium  8.9 - 10.3 mg/dL 9.3  9.4  9.5    Serum creatinine appears to be nearing baseline.  Will discharge patient on torsemide  100 mg daily.  She will have close follow-up with her heart failure team.   Pancytopenia  Normocytic anemia She received IV Fe infusions. Platelet count acutely dropped 159>79 her other cell lines were stably low. Her CBC was repeated on the day of discharge and her platelet count was 54.  She had no evidence of active bleeding.  She follows closely with her oncology team and this can be furthered monitored there.    Chronic diastolic HF Severe TR Pulmonary HTN S/p ICD placement Continue  torsemeid 100mg  daily and patient will follow up with her HF team in 9/22  COPD with chronic hypoxic respiratory failure On home oxygen  2L [place] -- Not in acute exacerbation   Hx pancreatic neuroendocrine cancer Patient is on chemotherapy.   Continue follow-up with oncologist  outpatient   Hyperlipidemia:  --cont statin   CAD:  --cont statin   Peripheral vascular disease:  --cont statin  Severe malnutrition in context of chronic illness  --supplements per dietitian   Permanent Afib --not on anticoagulation due to GI bleeds --rate controlled not on nodal agents     DVT prophylaxis: Lovenox  SQ      Consultants: Cardiology  Procedures performed: None  Disposition: Skilled nursing facility Diet recommendation:  Cardiac diet DISCHARGE MEDICATION: Allergies as of 01/22/2024       Reactions   Codeine Nausea And Vomiting, Other (See Comments)   Hallucinations, too- CANNOT HAVE; thinks she may have received Narcan  to reverse the effect   Dilaudid  [hydromorphone ] Other (See Comments)   Excessive Somnolence- Required Narcan  X2   Tape Other (See Comments)   CAN TOLERATE ONLY EASY-RELEASE, PAPER TAPE, AS THE SKIN IS VERY THIN AND WILL BRUISE AND TEAR VERY EASILY   Amoxil  [amoxicillin ] Nausea Only   Asa [aspirin ] Other (See Comments)   Told to avoid due to reduced kidney function, has 1 remaining kidney   Ms Contin  [morphine ] Nausea And Vomiting, Other (See Comments)   Hallucinations, also   Neurontin  [gabapentin ] Other (See Comments)   Out of it   Nsaids Other (See Comments)   Told to avoid due to reduced kidney function, has 1 remaining kidney        Medication List     PAUSE taking these medications    metolazone  2.5 MG tablet Wait to take this until your doctor or other care provider tells you to start again. Commonly known as: ZAROXOLYN  Take 1 tablet (2.5 mg total) by mouth 3 (three) times a week. Every Tues., Thurs and Sat. with 40 meq of potassium       STOP taking these medications    cefdinir  250 MG/5ML suspension Commonly known as: OMNICEF    clotrimazole -betamethasone  cream Commonly known as: LOTRISONE    gabapentin  100 MG capsule Commonly known as: NEURONTIN    pantoprazole  40 MG tablet Commonly known as: PROTONIX     Santyl 250 UNIT/GM ointment Generic drug: collagenase       TAKE these medications    acetaminophen  500 MG tablet Commonly known as: TYLENOL  Take 1,000 mg by mouth every 8 (eight) hours.   B-12 PO Take 1 tablet by mouth daily.   CRANBERRY PO Take 1 tablet by mouth daily.   ferrous gluconate  324 MG tablet Commonly known as: FERGON Take 1 tablet (324 mg total) by mouth daily with breakfast.   fluticasone  50 MCG/ACT nasal spray Commonly known as: FLONASE  Place 2 sprays into both nostrils daily as needed for allergies or rhinitis.   HAIR SKIN NAILS PO Take 1 capsule by mouth daily.   leptospermum manuka honey Pste paste Apply 1 Application topically daily. Start taking on: January 23, 2024   Potassium Chloride  ER 20 MEQ Tbcr Take 1 tablet (20 mEq total) by mouth daily. What changed: when to take this   rosuvastatin  10 MG tablet Commonly known as:  CRESTOR  Take 1 tablet (10 mg total) by mouth daily.   torsemide  20 MG tablet Commonly known as: DEMADEX  Take 5 tablets (100 mg total) by mouth daily. What changed: when to take this   traMADol  50 MG tablet Commonly known as: ULTRAM  Take 1 tablet (50 mg total) by mouth every 12 (twelve) hours as needed.   VITAMIN C  PO Take 1 tablet by mouth daily.   VITAMIN D -3 PO Take 2 capsules by mouth daily.   zinc  sulfate (50mg  elemental zinc ) 220 (50 Zn) MG capsule Take 1 capsule (220 mg total) by mouth daily for 5 days. Start taking on: January 23, 2024        Contact information for after-discharge care     Destination     [facility] .   Service: Skilled Nursing Contact information: [address] [place]  [phone]                    Discharge Exam: Filed Weights   01/20/24 0931 01/21/24 0500 01/22/24 0500  Weight: 53.2 kg 53.4 kg 53.4 kg   Physical Exam  Constitutional: In no distress.  Cardiovascular: Irregularly irregular No  lower extremity edema  Pulmonary: Non labored breathing on room air, no wheezing or rales.   Abdominal: Soft. Non distended and non tender Musculoskeletal: Normal range of motion.     Neurological: Alert and oriented to person, place, and time. Non focal  Skin: BLE bandaged c/d/I    Condition at discharge: fair  The results of significant diagnostics from this hospitalization (including imaging, microbiology, ancillary and laboratory) are listed below for reference.   Imaging Studies: CT FOOT LEFT WO CONTRAST Result Date: 01/13/2024 CLINICAL DATA:  Osteomyelitis EXAM: CT OF THE LEFT FOOT WITHOUT CONTRAST TECHNIQUE: Multidetector CT imaging of the left foot was performed according to the standard protocol. Multiplanar CT image reconstructions were also generated. RADIATION DOSE REDUCTION: This exam was performed according to the departmental dose-optimization program which includes automated exposure control, adjustment of the mA and/or kV according to patient size and/or use of iterative reconstruction technique. COMPARISON:  01/01/2024 radiographs FINDINGS: Bones/Joint/Cartilage Bony demineralization. No bony destructive findings characteristic of osteomyelitis. No fracture identified. Small plantar and Achilles calcaneal spurs. Ligaments Suboptimally assessed by CT. Muscles and Tendons Unremarkable Soft tissues Cutaneous thickening in lobularity along the distal calf, cannot exclude blistering. This extends into the ankle and especially into the dorsum of the foot, with potential cutaneous ulceration dorsally overlying the cuboid on image 48 series 10. Confluent subcutaneous edema just plantar to the proximal plantar fascia, potentially from mild adventitial bursitis. IMPRESSION: 1. No bony destructive findings characteristic of osteomyelitis. 2. Cutaneous thickening and lobularity along the distal calf, ankle, and dorsum of the foot, with potential cutaneous ulceration dorsally overlying the cuboid.  3. Confluent subcutaneous edema just plantar to the proximal plantar fascia, potentially from mild adventitial bursitis. 4. Bony demineralization. 5. Small plantar and Achilles calcaneal spurs. Electronically Signed   By: Ryan Salvage M.D.   On: 01/13/2024 11:21   CT TIBIA FIBULA LEFT WO CONTRAST Result Date: 01/13/2024 CLINICAL DATA:  Osteomyelitis EXAM: CT OF THE LOWER LEFT EXTREMITY WITHOUT CONTRAST TECHNIQUE: Multidetector CT imaging of the lower left extremity was performed according to the standard protocol. RADIATION DOSE REDUCTION: This exam was performed according to the departmental dose-optimization program which includes automated exposure control, adjustment of the mA and/or kV according to patient size and/or use of iterative reconstruction technique. COMPARISON:  Radiographs 01/09/2024 FINDINGS: Bones/Joint/Cartilage Bony demineralization. Mild chronic periostitis in the tibia and fibula, probably a manifestation of venous insufficiency. No bony destructive findings characteristic of osteomyelitis. Ligaments Suboptimally assessed by CT. Muscles and Tendons Subtle loss of fat planes along the distal tibialis anterior and extensor digitorum longus myotendinous junctions for example on image 161 series 8, potentially reflecting low-grade myositis underlying the large volar ulceration. No drainable abscess observed. Soft tissues Cutaneous irregularity especially in the lower calf and especially anteriorly and tracking into the ankle. Cutaneous and subcutaneous ulceration at the distal metadiaphyseal level anteriorly on image 158 series 6. Cannot exclude cutaneous blistering along the lower calf. No appreciable drainable abscess. IMPRESSION: 1. Cutaneous and subcutaneous ulceration at the distal metadiaphyseal level anteriorly. Cannot exclude cutaneous blistering along the lower calf. No appreciable drainable abscess. 2. Subtle loss of fat planes along the distal tibialis anterior and extensor  digitorum longus myotendinous junctions, potentially reflecting low-grade myositis underlying the large volar ulceration. 3. No bony destructive findings characteristic of osteomyelitis. 4. Mild chronic periostitis in the tibia and fibula, probably a manifestation of venous insufficiency. 5. Bony demineralization. Electronically Signed   By: Ryan Salvage M.D.   On: 01/13/2024 11:15   US  ARTERIAL ABI (SCREENING LOWER EXTREMITY) Result Date: 01/11/2024 CLINICAL DATA:  Peripheral arterial disease. Wounds on both legs. Left leg could not be evaluated due to wounds and patient pain. EXAM: NONINVASIVE PHYSIOLOGIC VASCULAR STUDY OF BILATERAL LOWER EXTREMITIES TECHNIQUE: Evaluation of both lower extremities were performed at rest, including calculation of ankle-brachial indices with single level Doppler, pressure and pulse volume recording. COMPARISON:  None Available. FINDINGS: Right ABI:  0.78 Left ABI:  Not evaluated Right Lower Extremity:  Irregular waveforms at the right ankle. Left Lower Extremity:  Not evaluated 0.5-0.79 Moderate PAD IMPRESSION: 1. Right ankle-brachial index is 0.78 suggesting moderate peripheral arterial disease. 2. Left leg could not be evaluated. Consider further evaluation with CTA or MRA if needed. Electronically Signed   By: Juliene Balder M.D.   On: 01/11/2024 08:11   US  Venous Img Lower Bilateral Result Date: 01/09/2024 EXAM: ULTRASOUND DUPLEX OF THE BILATERAL LOWER EXTREMITY VEINS TECHNIQUE: Duplex ultrasound using B-mode/gray scaled imaging and Doppler spectral analysis and color flow was obtained of the deep venous structures of the bilateral lower extremity. COMPARISON: None. CLINICAL HISTORY: Cancer patient, worsening redness of bilateral lower extremities, swelling. FINDINGS: The visualized veins of the lower extremity are patent and free of echogenic thrombus. Limited views of the veins in the area of lower extremity wounds. The veins demonstrate good compressibility with normal  color flow study and spectral analysis. IMPRESSION: 1. No evidence of DVT. Electronically signed by: Norman Gatlin MD 01/09/2024 06:58 PM EDT RP Workstation: [id number]   DG Tibia/Fibula Left Result Date: 01/09/2024 CLINICAL DATA:  Open wound, erythema and swelling EXAM: LEFT TIBIA AND FIBULA - 2 VIEW COMPARISON:  01/01/2024 FINDINGS: Frontal and lateral views of the left tibia and fibula are obtained. The bones are diffusely osteopenic. There are no acute or destructive bony abnormalities. Specifically, no evidence of bony destruction or periosteal reaction to suggest osteomyelitis. Diffuse subcutaneous edema is again identified, with a large soft tissue defect within the anterior aspect of the distal left lower leg unchanged. No evidence of subcutaneous gas extending from the soft tissue defect. There are no radiopaque foreign bodies. IMPRESSION: 1. Large soft tissue defect within the distal left lower leg unchanged, with persistent diffuse subcutaneous edema. 2. No acute or destructive bony abnormalities. Electronically Signed   By:  Ozell Daring M.D.   On: 01/09/2024 18:18   DG Tibia/Fibula Left Result Date: 01/08/2024 EXAM: 3 VIEW(S) XRAY OF THE LEFT TIBIA AND FIBULA 01/01/2024 01:43:00 PM COMPARISON: Left ankle series dated 02/08/2001. CLINICAL HISTORY: r/o osteomyelitis. Venous ulcer, r/o osteomyelitis. Wounds on distal left tib/fib and lateral left foot. FINDINGS: BONES AND JOINTS: No acute fracture. No focal osseous lesion. No joint dislocation. Benign-appearing periosteal reactions along the shafts of the tibia and fibula. No osseous erosions. SOFT TISSUES: Ulcerative lesion present anteriorly within the lower calf. Mild diffuse soft tissue swelling. IMPRESSION: 1. No evidence of osteomyelitis. 2. Benign-appearing periosteal reactions along the shafts of the tibia and fibula. 3. Ulcerative lesion present anteriorly within the lower calf. 4. Mild diffuse soft tissue swelling. Electronically signed by:  Evalene Coho MD 01/08/2024 01:00 PM EDT RP Workstation: [id number]   DG Foot Complete Left Result Date: 01/08/2024 EXAM: 3 or more VIEW(S) XRAY OF THE LEFT FOOT 01/01/2024 01:43:00 PM COMPARISON: 11/10/2019 CLINICAL HISTORY: r/o osteomyelitis. Venous ulcer, r/o osteomyelitis. Wounds on distal left tib/fib and lateral left foot. FINDINGS: BONES AND JOINTS: The bones of the left foot appeared diffusely osteopenic but there are no erosions evident. No acute fracture. No focal osseous lesion. No joint dislocation. SOFT TISSUES: There is soft tissue gas present lateral to the fifth tarsometatarsal joint. IMPRESSION: 1. No acute findings of osteomyelitis. 2. Soft tissue gas lateral to the fifth tarsometatarsal joint. 3. Diffuse osteopenia without erosions. Electronically signed by: Evalene Coho MD 01/08/2024 12:58 PM EDT RP Workstation: [id number]    Microbiology: Results for orders placed or performed during the hospital encounter of 01/09/24  Blood culture (routine x 2)     Status: None   Collection Time: 01/09/24  6:34 PM   Specimen: BLOOD  Result Value Ref Range Status   Specimen Description BLOOD LEFT ANTECUBITAL  Final   Special Requests   Final    BOTTLES DRAWN AEROBIC AND ANAEROBIC Blood Culture results may not be optimal due to an inadequate volume of blood received in culture bottles   Culture   Final    NO GROWTH 5 DAYS Performed at [facility], [address] [place], [place] [address]    Report Status 01/14/2024 FINAL  Final  Blood culture (routine x 2)     Status: None   Collection Time: 01/09/24  9:56 PM   Specimen: BLOOD  Result Value Ref Range Status   Specimen Description BLOOD BLOOD RIGHT ARM  Final   Special Requests   Final    BOTTLES DRAWN AEROBIC AND ANAEROBIC Blood Culture results may not be optimal due to an inadequate volume of blood received in culture bottles   Culture   Final    NO GROWTH 5 DAYS Performed at [facility], [address] [place], [place] [address]    Report Status 01/14/2024 FINAL  Final   *Note: Due to a large number of results and/or encounters for the requested time period, some results have not been displayed. A complete set of results can be found in Results Review.    Labs: CBC: Recent Labs  Lab 01/16/24 0406 01/21/24 0508  WBC 3.4* 3.5*  HGB 9.6* 10.2*  HCT 32.1* 33.1*  MCV 80.5 79.4*  PLT 159 79*   Basic Metabolic Panel: Recent Labs  Lab 01/16/24 0406 01/17/24 0451 01/18/24 0314 01/19/24 0351 01/20/24 0607 01/21/24 0508 01/22/24 0512  NA 135   < > 135 138 135 137 136  K 4.4   < >  4.8 5.4* 4.8 4.2 3.9  CL 92*   < > 98 101 96* 92* 95*  CO2 32   < > 27 27 27 28 31   GLUCOSE 107*   < > 95 74 78 104* 96  BUN 62*   < > 60* 53* 67* 72* 74*  CREATININE 2.41*   < > 2.14* 1.96* 2.24* 2.28* 2.05*  CALCIUM  9.1   < > 9.1 9.3 9.5 9.4 9.3  MG 2.2  --   --   --   --   --   --    < > = values in this interval not displayed.   Liver Function Tests: No results for input(s): AST, ALT, ALKPHOS, BILITOT, PROT, ALBUMIN  in the last 168 hours. CBG: No results for input(s): GLUCAP in the last 168 hours.  Discharge time spent: greater than 30 minutes.  Signed: Alban Pepper, MD [facility] 01/22/2024

## 2024-01-23 ENCOUNTER — Telehealth: Payer: Self-pay | Admitting: Family

## 2024-01-23 DIAGNOSIS — I5032 Chronic diastolic (congestive) heart failure: Secondary | ICD-10-CM | POA: Diagnosis not present

## 2024-01-23 DIAGNOSIS — S81802A Unspecified open wound, left lower leg, initial encounter: Secondary | ICD-10-CM | POA: Diagnosis not present

## 2024-01-23 LAB — BASIC METABOLIC PANEL WITH GFR
Anion gap: 15 (ref 5–15)
BUN: 71 mg/dL — ABNORMAL HIGH (ref 8–23)
CO2: 29 mmol/L (ref 22–32)
Calcium: 9.8 mg/dL (ref 8.9–10.3)
Chloride: 95 mmol/L — ABNORMAL LOW (ref 98–111)
Creatinine, Ser: 2.13 mg/dL — ABNORMAL HIGH (ref 0.44–1.00)
GFR, Estimated: 22 mL/min — ABNORMAL LOW (ref >60)
Glucose, Bld: 87 mg/dL (ref 70–99)
Potassium: 4 mmol/L (ref 3.5–5.1)
Sodium: 139 mmol/L (ref 135–145)

## 2024-01-23 LAB — CBC
HCT: 34.7 % — ABNORMAL LOW (ref 36.0–46.0)
Hemoglobin: 10.5 g/dL — ABNORMAL LOW (ref 12.0–15.0)
MCH: 24.1 pg — ABNORMAL LOW (ref 26.0–34.0)
MCHC: 30.3 g/dL (ref 30.0–36.0)
MCV: 79.8 fL — ABNORMAL LOW (ref 80.0–100.0)
Platelets: 55 K/uL — ABNORMAL LOW (ref 150–400)
RBC: 4.35 MIL/uL (ref 3.87–5.11)
RDW: 19.2 % — ABNORMAL HIGH (ref 11.5–15.5)
WBC: 4.8 K/uL (ref 4.0–10.5)
nRBC: 0 % (ref 0.0–0.2)

## 2024-01-23 MED ORDER — HALOPERIDOL 1 MG PO TABS
1.0000 mg | ORAL_TABLET | Freq: Four times a day (QID) | ORAL | Status: DC | PRN
  Administered 2024-01-23: 1 mg via ORAL
  Filled 2024-01-23 (×2): qty 1

## 2024-01-23 MED ORDER — HALOPERIDOL LACTATE 5 MG/ML IJ SOLN
1.0000 mg | Freq: Four times a day (QID) | INTRAMUSCULAR | Status: DC | PRN
Start: 2024-01-23 — End: 2024-01-24

## 2024-01-23 NOTE — Discharge Summary (Addendum)
 Physician Discharge Summary   Patient: [name] MRN: [id number] DOB: [birth year]  Admit date:     01/09/2024  Discharge date: 01/26/24  Discharge Physician: Alban Pepper   PCP: [name], MD   Recommendations at discharge:    Need cbc checked outpatient with oncology to ensure stable.    Discharge Diagnoses: Principal Problem:   Leg wound, left, initial encounter Active Problems:   Cellulitis of left lower extremity   Acute on chronic diastolic CHF (congestive heart failure) (HCC)   Normocytic anemia   Primary pancreatic neuroendocrine tumor   Hyperlipidemia   HOCM/IHSS   Atrial fibrillation, permanent (HCC)   COPD (chronic obstructive pulmonary disease) (HCC)   Acute on chronic kidney failure (HCC)   ICD (implantable cardioverter-defibrillator) in place   Hypertension   PAD (peripheral artery disease) (HCC)   History of nephrectomy secondary to RCC   Pulmonary hypertension (HCC)   MRSA (methicillin resistant staph aureus) culture positive   Protein-calorie malnutrition, severe  Resolved Problems:   * No resolved hospital problems. *  Hospital Course: [name] is a 89 y.o. female with medical history significant of pancreatic neuroendocrine tumor on chemotherapy, peripheral vascular disease, heart failure with preserved ejection fraction, severe TR, chronic kidney failure stage IIIb4, history of nephrectomy, pulmonary hypertension, chronic stasis dermatitis, essential hypertension, COPD, atrial fibrillation, squamous cell carcinoma of the skin, hypertrophic cardiomyopathy, hyperlipidemia, normocytic anemia, on chronic oxygen  at home, chronic lower extremity wounds and left lower extremity wound, follows at wound care center and Dermatologist.   Patient has 2 lower extremity ulcers, poor healing.  Has been on antibiotics, cultures growing MRSA and Corynebacterium, was sent to the hospital for IV antibiotics.   Assessment and Plan: Infected left  lower extremity wound with surrounding cellulitis - outpatient culture apparently growing MRSA and corynebacterium - No evidence of DVT or osteomyelitis - Seen by WOC, recommend debridement - Seen by vascular surgery, debridement would be appropriate, - Discussed with oncology, pt will not be able to do debridement for 4 to 6 weeks as patient has been on Avastin .  If there is plan for intervention, Avastin  would need to be on hold and anemia would worsen - Seen by orthopedics, appreciate recs.  No signs of bone involvement - Seen by ID.  Status post 10 day course of linezolid  -Will continue wound care for now and continue to monitor for signs of infection  AKI on CKD 3b/4    Latest Ref Rng & Units 01/26/2024    3:06 AM 01/25/2024   12:30 PM 01/25/2024    3:13 AM  BMP  Glucose 70 - 99 mg/dL 97  890  890   BUN 8 - 23 mg/dL 64  73  80   Creatinine 0.44 - 1.00 mg/dL 7.87  7.64  7.46   Sodium 135 - 145 mmol/L 140  139  138   Potassium 3.5 - 5.1 mmol/L 3.8  3.6  3.8   Chloride 98 - 111 mmol/L 96  96  97   CO2 22 - 32 mmol/L 32  33  31   Calcium  8.9 - 10.3 mg/dL 8.9  8.9  9.1    Patient's serum creatinine was elevated. Patient was confused and not eating much after receiving IV antipsychotic for visual disturbances and agitation. Her diuretic was held and she was given gentle fluids and her serum creatinine normalized. Her mentation has improved and she is now able to eat and drink without difficulty.   She will discharge on  her home torsemide     Thrombocytopenia  Normocytic anemia Patient's hemoglobin has been fairly stable.  She did have a transient leukopenia that has since resolved.  Her platelet count has remained decreased.  She had a nadir of 36,000.  Number day of discharge was 38,000.  She does have some ecchymoses on her upper extremities but otherwise no evidence of bleeding.  She is not currently on any medications that she does not take in the outpatient setting.  A HIT antibody  was sent which was negative.  Discussed patient's thrombocytopenia with her outpatient oncologist who feels linezolid  is likely culprit of this. Patient is safe to discharge and will follow-up in outpatient oncology for repeat lab draw and 1 week.     Latest Ref Rng & Units 01/26/2024    3:06 AM 01/25/2024    3:13 AM 01/24/2024   10:02 AM  CBC  WBC 4.0 - 10.5 K/uL 5.0  5.4  5.0   Hemoglobin 12.0 - 15.0 g/dL 9.5  9.6  89.8   Hematocrit 36.0 - 46.0 % 31.5  30.3  33.7   Platelets 150 - 400 K/uL 38  57  36      Chronic diastolic HF Severe TR Pulmonary HTN S/p ICD placement Torsemide  briefly held, resumed on discharge  COPD with chronic hypoxic respiratory failure On home oxygen  2L [place] -- Not in acute exacerbation   Hx pancreatic neuroendocrine cancer Patient is on chemotherapy.   Continue follow-up with oncologist outpatient   Hyperlipidemia:  --cont statin   CAD:  --cont statin   Peripheral vascular disease:  --cont statin  Severe malnutrition in context of chronic illness  --supplements per dietitian   Permanent Afib --not on anticoagulation due to GI bleeds --rate controlled not on nodal agents   Delirium Suspect this is hospital delirium.  Low suspicion of infection.   DVT prophylaxis: Lovenox  SQ      Consultants: Cardiology, Infectious disease  Procedures performed: None  Disposition: Skilled nursing facility Diet recommendation:  Cardiac diet DISCHARGE MEDICATION: Allergies as of 01/23/2024       Reactions   Codeine Nausea And Vomiting, Other (See Comments)   Hallucinations, too- CANNOT HAVE; thinks she may have received Narcan  to reverse the effect   Dilaudid  [hydromorphone ] Other (See Comments)   Excessive Somnolence- Required Narcan  X2   Tape Other (See Comments)   CAN TOLERATE ONLY EASY-RELEASE, PAPER TAPE, AS THE SKIN IS VERY THIN AND WILL BRUISE AND TEAR VERY EASILY   Amoxil  [amoxicillin ] Nausea Only   Asa [aspirin ] Other (See Comments)    Told to avoid due to reduced kidney function, has 1 remaining kidney   Ms Contin  [morphine ] Nausea And Vomiting, Other (See Comments)   Hallucinations, also   Neurontin  [gabapentin ] Other (See Comments)   Out of it   Nsaids Other (See Comments)   Told to avoid due to reduced kidney function, has 1 remaining kidney        Medication List     PAUSE taking these medications    metolazone  2.5 MG tablet Wait to take this until your doctor or other care provider tells you to start again. Commonly known as: ZAROXOLYN  Take 1 tablet (2.5 mg total) by mouth 3 (three) times a week. Every Tues., Thurs and Sat. with 40 meq of potassium       STOP taking these medications    cefdinir  250 MG/5ML suspension Commonly known as: OMNICEF    clotrimazole -betamethasone  cream Commonly known as: LOTRISONE    gabapentin  100  MG capsule Commonly known as: NEURONTIN    pantoprazole  40 MG tablet Commonly known as: PROTONIX    Santyl 250 UNIT/GM ointment Generic drug: collagenase       TAKE these medications    acetaminophen  500 MG tablet Commonly known as: TYLENOL  Take 1,000 mg by mouth every 8 (eight) hours.   B-12 PO Take 1 tablet by mouth daily.   CRANBERRY PO Take 1 tablet by mouth daily.   ferrous gluconate  324 MG tablet Commonly known as: FERGON Take 1 tablet (324 mg total) by mouth daily with breakfast.   fluticasone  50 MCG/ACT nasal spray Commonly known as: FLONASE  Place 2 sprays into both nostrils daily as needed for allergies or rhinitis.   HAIR SKIN NAILS PO Take 1 capsule by mouth daily.   leptospermum manuka honey Pste paste Apply 1 Application topically daily.   Potassium Chloride  ER 20 MEQ Tbcr Take 1 tablet (20 mEq total) by mouth daily. What changed: when to take this   rosuvastatin  10 MG tablet Commonly known as: CRESTOR  Take 1 tablet (10 mg total) by mouth daily.   torsemide  20 MG tablet Commonly known as: DEMADEX  Take 5 tablets (100 mg total) by  mouth daily. What changed: when to take this   traMADol  50 MG tablet Commonly known as: ULTRAM  Take 1 tablet (50 mg total) by mouth every 12 (twelve) hours as needed.   VITAMIN C  PO Take 1 tablet by mouth daily.   VITAMIN D -3 PO Take 2 capsules by mouth daily.   zinc  sulfate (50mg  elemental zinc ) 220 (50 Zn) MG capsule Take 1 capsule (220 mg total) by mouth daily for 5 days.        Contact information for follow-up providers     [facility]. Go on 01/26/2024.   Specialty: Cardiology Why: Hospital Follow-Up 01/26/24 @ 1:30PM Please bring all medications to follow-up appointment [facility], [address] Floor Free Valet Parking at the door Contact information: [address] [place]  [address] [phone]             Contact information for after-discharge care     Destination     [facility] .   Service: Skilled Nursing Contact information: [address] [place]  [address] [phone]                    Discharge Exam:    01/26/2024    8:51 AM 01/26/2024    5:00 AM 01/26/2024    4:52 AM  Vitals with BMI  Weight  117 lbs 1 oz   BMI  18.9   Systolic 134  134  Diastolic 63  57  Pulse 60  60     Constitutional: In no distress. Thin  Cardiovascular: Normal rate, regular rhythm. No lower extremity edema  Pulmonary: Non labored breathing on Tangent, no wheezing or rales.   Abdominal: Soft. Non distended and non tender Musculoskeletal: Normal range of motion.     Neurological: Alert and oriented to person, place, and time.  Skin: BLE bandaged c/d/I      Condition at discharge: fair  The results of significant diagnostics from this hospitalization (including imaging, microbiology, ancillary and laboratory) are listed below for reference.   Imaging Studies: CT FOOT LEFT WO CONTRAST Result Date:  01/13/2024 CLINICAL DATA:  Osteomyelitis EXAM: CT OF THE LEFT FOOT WITHOUT CONTRAST TECHNIQUE: Multidetector CT imaging of the left foot was performed according  to the standard protocol. Multiplanar CT image reconstructions were also generated. RADIATION DOSE REDUCTION: This exam was performed according to the departmental dose-optimization program which includes automated exposure control, adjustment of the mA and/or kV according to patient size and/or use of iterative reconstruction technique. COMPARISON:  01/01/2024 radiographs FINDINGS: Bones/Joint/Cartilage Bony demineralization. No bony destructive findings characteristic of osteomyelitis. No fracture identified. Small plantar and Achilles calcaneal spurs. Ligaments Suboptimally assessed by CT. Muscles and Tendons Unremarkable Soft tissues Cutaneous thickening in lobularity along the distal calf, cannot exclude blistering. This extends into the ankle and especially into the dorsum of the foot, with potential cutaneous ulceration dorsally overlying the cuboid on image 48 series 10. Confluent subcutaneous edema just plantar to the proximal plantar fascia, potentially from mild adventitial bursitis. IMPRESSION: 1. No bony destructive findings characteristic of osteomyelitis. 2. Cutaneous thickening and lobularity along the distal calf, ankle, and dorsum of the foot, with potential cutaneous ulceration dorsally overlying the cuboid. 3. Confluent subcutaneous edema just plantar to the proximal plantar fascia, potentially from mild adventitial bursitis. 4. Bony demineralization. 5. Small plantar and Achilles calcaneal spurs. Electronically Signed   By: Ryan Salvage M.D.   On: 01/13/2024 11:21   CT TIBIA FIBULA LEFT WO CONTRAST Result Date: 01/13/2024 CLINICAL DATA:  Osteomyelitis EXAM: CT OF THE LOWER LEFT EXTREMITY WITHOUT CONTRAST TECHNIQUE: Multidetector CT imaging of the lower left extremity was performed according to the standard protocol. RADIATION DOSE  REDUCTION: This exam was performed according to the departmental dose-optimization program which includes automated exposure control, adjustment of the mA and/or kV according to patient size and/or use of iterative reconstruction technique. COMPARISON:  Radiographs 01/09/2024 FINDINGS: Bones/Joint/Cartilage Bony demineralization. Mild chronic periostitis in the tibia and fibula, probably a manifestation of venous insufficiency. No bony destructive findings characteristic of osteomyelitis. Ligaments Suboptimally assessed by CT. Muscles and Tendons Subtle loss of fat planes along the distal tibialis anterior and extensor digitorum longus myotendinous junctions for example on image 161 series 8, potentially reflecting low-grade myositis underlying the large volar ulceration. No drainable abscess observed. Soft tissues Cutaneous irregularity especially in the lower calf and especially anteriorly and tracking into the ankle. Cutaneous and subcutaneous ulceration at the distal metadiaphyseal level anteriorly on image 158 series 6. Cannot exclude cutaneous blistering along the lower calf. No appreciable drainable abscess. IMPRESSION: 1. Cutaneous and subcutaneous ulceration at the distal metadiaphyseal level anteriorly. Cannot exclude cutaneous blistering along the lower calf. No appreciable drainable abscess. 2. Subtle loss of fat planes along the distal tibialis anterior and extensor digitorum longus myotendinous junctions, potentially reflecting low-grade myositis underlying the large volar ulceration. 3. No bony destructive findings characteristic of osteomyelitis. 4. Mild chronic periostitis in the tibia and fibula, probably a manifestation of venous insufficiency. 5. Bony demineralization. Electronically Signed   By: Ryan Salvage M.D.   On: 01/13/2024 11:15   US  ARTERIAL ABI (SCREENING LOWER EXTREMITY) Result Date: 01/11/2024 CLINICAL DATA:  Peripheral arterial disease. Wounds on both legs. Left leg could not  be evaluated due to wounds and patient pain. EXAM: NONINVASIVE PHYSIOLOGIC VASCULAR STUDY OF BILATERAL LOWER EXTREMITIES TECHNIQUE: Evaluation of both lower extremities were performed at rest, including calculation of ankle-brachial indices with single level Doppler, pressure and pulse volume recording. COMPARISON:  None Available. FINDINGS: Right ABI:  0.78 Left ABI:  Not evaluated Right Lower Extremity:  Irregular waveforms at the right ankle. Left Lower Extremity:  Not evaluated 0.5-0.79 Moderate PAD IMPRESSION: 1. Right ankle-brachial index is 0.78 suggesting moderate peripheral arterial disease. 2. Left  leg could not be evaluated. Consider further evaluation with CTA or MRA if needed. Electronically Signed   By: Juliene Balder M.D.   On: 01/11/2024 08:11   US  Venous Img Lower Bilateral Result Date: 01/09/2024 EXAM: ULTRASOUND DUPLEX OF THE BILATERAL LOWER EXTREMITY VEINS TECHNIQUE: Duplex ultrasound using B-mode/gray scaled imaging and Doppler spectral analysis and color flow was obtained of the deep venous structures of the bilateral lower extremity. COMPARISON: None. CLINICAL HISTORY: Cancer patient, worsening redness of bilateral lower extremities, swelling. FINDINGS: The visualized veins of the lower extremity are patent and free of echogenic thrombus. Limited views of the veins in the area of lower extremity wounds. The veins demonstrate good compressibility with normal color flow study and spectral analysis. IMPRESSION: 1. No evidence of DVT. Electronically signed by: Norman Gatlin MD 01/09/2024 06:58 PM EDT RP Workstation: [id number]   DG Tibia/Fibula Left Result Date: 01/09/2024 CLINICAL DATA:  Open wound, erythema and swelling EXAM: LEFT TIBIA AND FIBULA - 2 VIEW COMPARISON:  01/01/2024 FINDINGS: Frontal and lateral views of the left tibia and fibula are obtained. The bones are diffusely osteopenic. There are no acute or destructive bony abnormalities. Specifically, no evidence of bony destruction or  periosteal reaction to suggest osteomyelitis. Diffuse subcutaneous edema is again identified, with a large soft tissue defect within the anterior aspect of the distal left lower leg unchanged. No evidence of subcutaneous gas extending from the soft tissue defect. There are no radiopaque foreign bodies. IMPRESSION: 1. Large soft tissue defect within the distal left lower leg unchanged, with persistent diffuse subcutaneous edema. 2. No acute or destructive bony abnormalities. Electronically Signed   By: Ozell Daring M.D.   On: 01/09/2024 18:18   DG Tibia/Fibula Left Result Date: 01/08/2024 EXAM: 3 VIEW(S) XRAY OF THE LEFT TIBIA AND FIBULA 01/01/2024 01:43:00 PM COMPARISON: Left ankle series dated 02/08/2001. CLINICAL HISTORY: r/o osteomyelitis. Venous ulcer, r/o osteomyelitis. Wounds on distal left tib/fib and lateral left foot. FINDINGS: BONES AND JOINTS: No acute fracture. No focal osseous lesion. No joint dislocation. Benign-appearing periosteal reactions along the shafts of the tibia and fibula. No osseous erosions. SOFT TISSUES: Ulcerative lesion present anteriorly within the lower calf. Mild diffuse soft tissue swelling. IMPRESSION: 1. No evidence of osteomyelitis. 2. Benign-appearing periosteal reactions along the shafts of the tibia and fibula. 3. Ulcerative lesion present anteriorly within the lower calf. 4. Mild diffuse soft tissue swelling. Electronically signed by: Evalene Coho MD 01/08/2024 01:00 PM EDT RP Workstation: [id number]   DG Foot Complete Left Result Date: 01/08/2024 EXAM: 3 or more VIEW(S) XRAY OF THE LEFT FOOT 01/01/2024 01:43:00 PM COMPARISON: 11/10/2019 CLINICAL HISTORY: r/o osteomyelitis. Venous ulcer, r/o osteomyelitis. Wounds on distal left tib/fib and lateral left foot. FINDINGS: BONES AND JOINTS: The bones of the left foot appeared diffusely osteopenic but there are no erosions evident. No acute fracture. No focal osseous lesion. No joint dislocation. SOFT TISSUES: There is  soft tissue gas present lateral to the fifth tarsometatarsal joint. IMPRESSION: 1. No acute findings of osteomyelitis. 2. Soft tissue gas lateral to the fifth tarsometatarsal joint. 3. Diffuse osteopenia without erosions. Electronically signed by: Evalene Coho MD 01/08/2024 12:58 PM EDT RP Workstation: [id number]    Microbiology: Results for orders placed or performed during the hospital encounter of 01/09/24  Blood culture (routine x 2)     Status: None   Collection Time: 01/09/24  6:34 PM   Specimen: BLOOD  Result Value Ref Range Status   Specimen Description BLOOD LEFT ANTECUBITAL  Final  Special Requests   Final    BOTTLES DRAWN AEROBIC AND ANAEROBIC Blood Culture results may not be optimal due to an inadequate volume of blood received in culture bottles   Culture   Final    NO GROWTH 5 DAYS Performed at [facility], [address] [place], [place] [address]    Report Status 01/14/2024 FINAL  Final  Blood culture (routine x 2)     Status: None   Collection Time: 01/09/24  9:56 PM   Specimen: BLOOD  Result Value Ref Range Status   Specimen Description BLOOD BLOOD RIGHT ARM  Final   Special Requests   Final    BOTTLES DRAWN AEROBIC AND ANAEROBIC Blood Culture results may not be optimal due to an inadequate volume of blood received in culture bottles   Culture   Final    NO GROWTH 5 DAYS Performed at [facility], [address] [place], [place] [address]    Report Status 01/14/2024 FINAL  Final   *Note: Due to a large number of results and/or encounters for the requested time period, some results have not been displayed. A complete set of results can be found in Results Review.    Labs: CBC: Recent Labs  Lab 01/22/24 1105 01/23/24 0525 01/24/24 1002 01/25/24 0313 01/26/24 0306  WBC 2.8* 4.8 5.0 5.4 5.0  HGB 9.7* 10.5* 10.1* 9.6* 9.5*  HCT 32.2* 34.7* 33.7* 30.3* 31.5*  MCV 79.3* 79.8* 80.6 78.7* 80.8  PLT 54* 55* 36* 57* 38*   Basic  Metabolic Panel: Recent Labs  Lab 01/23/24 0525 01/24/24 1002 01/25/24 0313 01/25/24 1230 01/26/24 0306  NA 139 138 138 139 140  K 4.0 3.9 3.8 3.6 3.8  CL 95* 93* 97* 96* 96*  CO2 29 27 31  33* 32  GLUCOSE 87 93 109* 109* 97  BUN 71* 74* 80* 73* 64*  CREATININE 2.13* 2.35* 2.53* 2.35* 2.12*  CALCIUM  9.8 9.5 9.1 8.9 8.9  MG  --   --  2.3  --   --    Liver Function Tests: No results for input(s): AST, ALT, ALKPHOS, BILITOT, PROT, ALBUMIN  in the last 168 hours. CBG: No results for input(s): GLUCAP in the last 168 hours.  Discharge time spent: greater than 30 minutes.  Signed: Alban Pepper, MD [facility] 01/23/2024

## 2024-01-23 NOTE — Plan of Care (Signed)
   Problem: Clinical Measurements: Goal: Will remain free from infection Outcome: Progressing Goal: Diagnostic test results will improve Outcome: Progressing   Problem: Nutrition: Goal: Adequate nutrition will be maintained Outcome: Progressing

## 2024-01-23 NOTE — Telephone Encounter (Signed)
 Called to confirm/remind patient of their appointment [facility] on 01/23/24.   Appointment:   [] Confirmed  [] Left mess   [x] No answer/No voice mail  [] VM Full/unable to leave message  [] Phone not in service  Patient reminded to bring all medications and/or complete list.  Confirmed patient has transportation. Gave directions, instructed to utilize valet parking.

## 2024-01-23 NOTE — Progress Notes (Signed)
 Patient to discharge to SNF. Report called at 1225. PIV removed. Discharge instructions and follow up information printed and put in transfer packet. Transport arranged per CM. Daughter present and updated. Questions answered. No concerns voiced

## 2024-01-23 NOTE — TOC Transition Note (Signed)
 Transition of Care [facility]) - Discharge Note   Patient Details  Name: [name] MRN: [id number] Date of Birth: 1933/10/10  Transition of [facility]) CM/SW Contact:  Alvaro Louder, LCSW Phone Number: 01/23/2024, 12:14 PM   Clinical Narrative:     LCSWA received insurance approval for patient to admit to SNF [facility]. LCSWA confirmed with MD that patient is stable for discharge. LCSWA notified the patient and they are in agreement with discharge. LCSWA confirmed bed is available at SNF. Transport arranged with [facility] for next available.  Number to call report [phone], RM [phone]   TOC to follow for discharge  Final next level of care: Skilled Nursing Facility Barriers to Discharge: No Barriers Identified   Patient Goals and CMS Choice            Discharge Placement              Patient chooses bed at: [facility] Patient to be transferred to facility by: [facility] Name of family member notified: Self, Devere Patient and family notified of of transfer: 01/23/24  Discharge Plan and Services Additional resources added to the After Visit Summary for                                       Social Drivers of Health (SDOH) Interventions SDOH Screenings   Food Insecurity: No Food Insecurity (01/09/2024)  Housing: Low Risk  (01/09/2024)  Transportation Needs: No Transportation Needs (01/09/2024)  Utilities: Not At Risk (01/09/2024)  Alcohol Screen: Low Risk  (11/06/2023)  Depression (PHQ2-9): Low Risk  (12/08/2023)  Financial Resource Strain: Low Risk  (11/06/2023)  Physical Activity: Insufficiently Active (11/06/2023)  Social Connections: Socially Isolated (01/09/2024)  Stress: No Stress Concern Present (11/06/2023)  Tobacco Use: Medium Risk (01/09/2024)  Health Literacy: Adequate Health Literacy (11/06/2023)     Readmission Risk Interventions    06/17/2023   10:07 AM 01/31/2023    3:53 PM 11/09/2022   12:00 PM  Readmission Risk Prevention Plan  Transportation  Screening Complete Complete Complete  PCP or Specialist Appt within 3-5 Days   Complete  HRI or Home Care Consult  Complete Complete  Social Work Consult for Recovery Care Planning/Counseling   Complete  Palliative Care Screening  Not Applicable   Medication Review Oceanographer)  Complete Complete  HRI or Home Care Consult Complete    SW Recovery Care/Counseling Consult Complete    Palliative Care Screening Not Applicable    Skilled Nursing Facility Not Applicable

## 2024-01-23 NOTE — Progress Notes (Signed)
 Patient ID: [name], female   DOB: 1933-09-15, 89 y.o.   MRN: [id number]     Advanced Heart Failure Rounding Note  Cardiologist: Evalene Lunger, MD  Chief Complaint: CHF Subjective:    Creatinine trend 1.96 => 2.24 => 2.28 => 2.05 => 2.13, now on torsemide  100 mg daily.  Leg edema well-controlled. She has completed course of linezolid  for cellulitis.   Occasional confusion, seems clear at this time.   Thinks she is leaving for rehab facility today.    Objective:   Weight Range: 53.4 kg Body mass index is 19 kg/m.   Vital Signs:   Temp:  [97.5 F (36.4 C)-98.2 F (36.8 C)] 97.5 F (36.4 C) (09/19 0756) Pulse Rate:  [59-62] 61 (09/19 0756) Resp:  [17-18] 17 (09/19 0756) BP: (137-149)/(40-43) 149/40 (09/19 0756) SpO2:  [99 %-100 %] 100 % (09/19 0756) Last BM Date : 01/21/24  Weight change: Filed Weights   01/20/24 0931 01/21/24 0500 01/22/24 0500  Weight: 53.2 kg 53.4 kg 53.4 kg    Intake/Output:   Intake/Output Summary (Last 24 hours) at 01/23/2024 1204 Last data filed at 01/23/2024 0900 Gross per 24 hour  Intake 240 ml  Output --  Net 240 ml      Physical Exam    General: NAD Neck: JVP 8 cm, no thyromegaly or thyroid  nodule.  Lungs: Clear to auscultation bilaterally with normal respiratory effort. CV: Nondisplaced PMI.  Heart regular S1/S2, no S3/S4, no murmur.  1+ chronic ankle edema.  Abdomen: Soft, nontender, no hepatosplenomegaly, no distention.  Skin: Intact without lesions or rashes.  Neurologic: Alert and oriented x 3.  Psych: Normal affect. Extremities: Chronic venous stasis changes.  HEENT: Normal.   Telemetry   Not on telemetry.    Labs    CBC Recent Labs    01/22/24 1105 01/23/24 0525  WBC 2.8* 4.8  HGB 9.7* 10.5*  HCT 32.2* 34.7*  MCV 79.3* 79.8*  PLT 54* 55*   Basic Metabolic Panel Recent Labs    90/81/74 0512 01/23/24 0525  NA 136 139  K 3.9 4.0  CL 95* 95*  CO2 31 29  GLUCOSE 96 87  BUN 74* 71*  CREATININE  2.05* 2.13*  CALCIUM  9.3 9.8   Liver Function Tests No results for input(s): AST, ALT, ALKPHOS, BILITOT, PROT, ALBUMIN  in the last 72 hours. No results for input(s): LIPASE, AMYLASE in the last 72 hours. Cardiac Enzymes No results for input(s): CKTOTAL, CKMB, CKMBINDEX, TROPONINI in the last 72 hours.  BNP: BNP (last 3 results) Recent Labs    06/13/23 1552 06/24/23 1621 07/10/23 1641  BNP 349.6* 516.7* 322.2*    ProBNP (last 3 results) No results for input(s): PROBNP in the last 8760 hours.   D-Dimer No results for input(s): DDIMER in the last 72 hours. Hemoglobin A1C No results for input(s): HGBA1C in the last 72 hours. Fasting Lipid Panel No results for input(s): CHOL, HDL, LDLCALC, TRIG, CHOLHDL, LDLDIRECT in the last 72 hours. Thyroid  Function Tests No results for input(s): TSH, T4TOTAL, T3FREE, THYROIDAB in the last 72 hours.  Invalid input(s): FREET3  Other results:   Imaging    No results found.   Medications:     Scheduled Medications:  feeding supplement  1 Container Oral TID BM   leptospermum manuka honey  1 Application Topical Daily   multivitamin with minerals  1 tablet Oral Daily   rosuvastatin   10 mg Oral Daily   torsemide   100 mg Oral Daily  zinc  sulfate (50mg  elemental zinc )  220 mg Oral Daily    Infusions:   PRN Medications: acetaminophen , magic mouthwash w/lidocaine , ondansetron  **OR** ondansetron  (ZOFRAN ) IV, prochlorperazine , traMADol      Assessment/Plan   1. Chronic diastolic CHF: With prominent RV failure. She had remote nonischemic cardiomyopathy with recovery of EF, has [employer] ICD. Atrial fibrillation likely contributes, but this appears to be permanent now as she has failed multiple anti-arrhythmics and is reasonably rate-controlled. RHC 11/20 showed elevated filling pressures and pulmonary venous hypertension. Prominent v-waves on PCWP tracing likely due to stiff  ventricle/diastolic dysfunction as she had only mild MR on echo. She is RV pacing at a high percentage, but LV EF has remained normal. Most recent echo in 5/25 showed EF 60-65%, mild LVH, d-shaped interventricular septum, severe RV enlargement with moderately decreased RV systolic function, PASP 96 mmHg, severe TR, mild MR with moderate MS (mean gradient 5 mmHg), moderate AS, IVC dilated. Patient has prominent JVD but this is in the setting of severe TR.  Diuretics were on hold then restarted.  Creatinine 1.96 => 2.24 => 2.28 => 2.05 => 2.13 (her baseline recently has been 2-2.3).  - Continue torsemide  100 mg daily.  Hold off on metolazone  for now.  She can go to SNF on this regimen, will make close CHF clinic followup to make sure she does not build up significant fluid. If she starts to develop significant edema, will need to call CHF clinic.  - Off SGLT2i after UTIs 2. Hypertension: BP not elevated.  3. Pulmonary hypertension:  Based on RHC 11/20, she has primarily pulmonary venous hypertension, not candidate for pulmonary vasodilators.  4. CKD: Stage 4. Off SGLT2i.    - As above, follow creatinine closely back on torsemide .  Keep same dose unless creatinine continues to rise.  5. Chronic anemia/GI bleeding: Due to small bowel AVMs. She has had transfusions and IV Fe, she was most recently admitted for this in 5/25.  - She is now off anticoagulation.  - Lovenox  DVT prophylaxis dose.  6. Atrial fibrillation: Permanent. Rate control is reasonable off nodal blockers. She has failed amiodarone , Tikosyn , and flecainide  as well as multiple cardioversions.  - Off Eliquis  with recurrent GI bleeding and transfusions.  - Not Watchman candidate due to frailty and poorly compensated CHF.  7. Neuroendocrine tumor of pancreatic head - Ongoing treatment per oncology.  8. Tricuspid regurgitation: Severe on 5/25 echo.   9. Mitral stenosis: Moderate on 5/25 echo.  - Follow, not surgical candidate.  10. Aortic  stenosis: Moderate on 5/25 echo.  11. Cellulitis/chronic LE wounds: Suspect recurrent lower extremity cellulitis with MRSA and corynebacterium, now on linezolid .  No evidence for DVT or osteomyelitis.  She has completed linezolid .  12. Neuro: She has had some delirium in hospital.  Currently oriented to place and knows who I am.  13. Disposition: Plan SNF, possibly today.     Length of Stay: 33  Ezra Shuck, MD  01/23/2024, 12:04 PM  Advanced Heart Failure Team Pager [phone] (M-F; 7a - 5p)  Please contact [facility] for night-coverage after hours (5p -7a ) and weekends on [email or web address]

## 2024-01-23 NOTE — Progress Notes (Signed)
 Family asked to appeal discharge for tonight due to new onset of altered mental status with patient. Patient's family made concerns about the safety of discharge to facility with it being late in the evening and with the patients mentation status. Provider made aware and order prn haldol  for new onset of altered mental status. Discharge held per family request and concerns. Will re address with AM team.

## 2024-01-24 DIAGNOSIS — S81802D Unspecified open wound, left lower leg, subsequent encounter: Secondary | ICD-10-CM | POA: Diagnosis not present

## 2024-01-24 LAB — URINALYSIS, COMPLETE (UACMP) WITH MICROSCOPIC
Bilirubin Urine: NEGATIVE
Glucose, UA: NEGATIVE mg/dL
Ketones, ur: NEGATIVE mg/dL
Nitrite: NEGATIVE
Protein, ur: 30 mg/dL — AB
Specific Gravity, Urine: 1.011 (ref 1.005–1.030)
WBC, UA: >50 WBC/hpf (ref 0–5)
pH: 5 (ref 5.0–8.0)

## 2024-01-24 LAB — CBC
HCT: 33.7 % — ABNORMAL LOW (ref 36.0–46.0)
Hemoglobin: 10.1 g/dL — ABNORMAL LOW (ref 12.0–15.0)
MCH: 24.2 pg — ABNORMAL LOW (ref 26.0–34.0)
MCHC: 30 g/dL (ref 30.0–36.0)
MCV: 80.6 fL (ref 80.0–100.0)
Platelets: 36 K/uL — ABNORMAL LOW (ref 150–400)
RBC: 4.18 MIL/uL (ref 3.87–5.11)
RDW: 19.1 % — ABNORMAL HIGH (ref 11.5–15.5)
WBC: 5 K/uL (ref 4.0–10.5)
nRBC: 0 % (ref 0.0–0.2)

## 2024-01-24 LAB — HEPARIN INDUCED PLATELET AB (HIT ANTIBODY): Heparin Induced Plt Ab: 0.1 {OD_unit} (ref 0.000–0.400)

## 2024-01-24 LAB — BASIC METABOLIC PANEL WITH GFR
Anion gap: 18 — ABNORMAL HIGH (ref 5–15)
BUN: 74 mg/dL — ABNORMAL HIGH (ref 8–23)
CO2: 27 mmol/L (ref 22–32)
Calcium: 9.5 mg/dL (ref 8.9–10.3)
Chloride: 93 mmol/L — ABNORMAL LOW (ref 98–111)
Creatinine, Ser: 2.35 mg/dL — ABNORMAL HIGH (ref 0.44–1.00)
GFR, Estimated: 20 mL/min — ABNORMAL LOW (ref >60)
Glucose, Bld: 93 mg/dL (ref 70–99)
Potassium: 3.9 mmol/L (ref 3.5–5.1)
Sodium: 138 mmol/L (ref 135–145)

## 2024-01-24 MED ORDER — FLUCONAZOLE 50 MG PO TABS
75.0000 mg | ORAL_TABLET | Freq: Once | ORAL | Status: DC
  Filled 2024-01-24: qty 2
  Filled 2024-01-24: qty 1
  Filled 2024-01-24: qty 2

## 2024-01-24 MED ORDER — APIXABAN 5 MG PO TABS
5.0000 mg | ORAL_TABLET | Freq: Two times a day (BID) | ORAL | Status: DC
  Administered 2024-01-24: 5 mg via ORAL
  Filled 2024-01-24: qty 1

## 2024-01-24 MED ORDER — SODIUM CHLORIDE 0.9 % IV BOLUS
250.0000 mL | Freq: Once | INTRAVENOUS | Status: AC
  Administered 2024-01-24: 250 mL via INTRAVENOUS

## 2024-01-24 MED ORDER — FLUCONAZOLE 50 MG PO TABS
75.0000 mg | ORAL_TABLET | Freq: Once | ORAL | Status: AC
  Administered 2024-01-24: 75 mg via ORAL
  Filled 2024-01-24: qty 2

## 2024-01-24 NOTE — TOC Progression Note (Signed)
 Transition of Care [facility]) - Progression Note    Patient Details  Name: [name] MRN: [id number] Date of Birth: [birth year]  Transition of Care [facility]) CM/SW Contact  [place], [place] Phone Number: 01/24/2024, 10:43 AM  Clinical Narrative:     The patient was not discharged to [facility] last evening due to concerns over an altered mental status. Upon review of the documentation, it was not clear what specific changes in her mental status were identified. After further investigation, it appears that the patient's daughter raised concerns, but there was no new onset alteration observed by the provider. As such, the patient is unable to discharge to [facility] until Monday because her bed at that facility is no longer available.    Barriers to Discharge: No Barriers Identified               Expected Discharge Plan and Services         Expected Discharge Date: 01/23/24                                     Social Drivers of Health (SDOH) Interventions SDOH Screenings   Food Insecurity: No Food Insecurity (01/09/2024)  Housing: Low Risk  (01/09/2024)  Transportation Needs: No Transportation Needs (01/09/2024)  Utilities: Not At Risk (01/09/2024)  Alcohol Screen: Low Risk  (11/06/2023)  Depression (PHQ2-9): Low Risk  (12/08/2023)  Financial Resource Strain: Low Risk  (11/06/2023)  Physical Activity: Insufficiently Active (11/06/2023)  Social Connections: Socially Isolated (01/09/2024)  Stress: No Stress Concern Present (11/06/2023)  Tobacco Use: Medium Risk (01/09/2024)  Health Literacy: Adequate Health Literacy (11/06/2023)    Readmission Risk Interventions    06/17/2023   10:07 AM 01/31/2023    3:53 PM 11/09/2022   12:00 PM  Readmission Risk Prevention Plan  Transportation Screening Complete Complete Complete  PCP or Specialist Appt within 3-5 Days   Complete  HRI or Home Care Consult  Complete Complete  Social Work Consult for Recovery Care  Planning/Counseling   Complete  Palliative Care Screening  Not Applicable   Medication Review [facility])  Complete Complete  HRI or Home Care Consult Complete    SW Recovery Care/Counseling Consult Complete    Palliative Care Screening Not Applicable    Skilled Nursing Facility Not Applicable

## 2024-01-24 NOTE — TOC CM/SW Note (Addendum)
..  Transition of Care [facility]) - Inpatient Brief Assessment   Patient Details  Name: [name] MRN: [id number] Date of Birth: April 28, 1934  Transition of [facility]) CM/SW Contact:    Edsel DELENA Fischer, LCSW Phone Number: 01/24/2024, 4:41 PM   Clinical Narrative:  TOC reached out to [facility].  [facility] messages No additional verification is needed after we have confirmed the patient is part of the ACO and eligible Sorry- just to clarify this patient is HTA and we do not complete authorizations for them.  This email is only for [facility] [employer]) waiver verification. C Batta is not eligible for the waiver as she has HTA.  TOC to follow up with HTA.   Transition of Care Asessment:

## 2024-01-24 NOTE — Plan of Care (Signed)

## 2024-01-24 NOTE — Progress Notes (Signed)
 PHARMACY - ANTICOAGULATION CONSULT NOTE  Pharmacy Consult for Apixaban  Indication: VTE prophylaxis in setting of possible HIT and CKD  Allergies  Allergen Reactions   Codeine Nausea And Vomiting and Other (See Comments)    Hallucinations, too- CANNOT HAVE; thinks she may have received Narcan  to reverse the effect   Dilaudid  [Hydromorphone ] Other (See Comments)    Excessive Somnolence- Required Narcan  X2   Tape Other (See Comments)    CAN TOLERATE ONLY EASY-RELEASE, PAPER TAPE, AS THE SKIN IS VERY THIN AND WILL BRUISE AND TEAR VERY EASILY   Amoxil  [Amoxicillin ] Nausea Only   Dorethia Jewett ] Other (See Comments)    Told to avoid due to reduced kidney function, has 1 remaining kidney   Ms Contin  [Morphine ] Nausea And Vomiting and Other (See Comments)    Hallucinations, also   Neurontin  [Gabapentin ] Other (See Comments)    Out of it   Nsaids Other (See Comments)    Told to avoid due to reduced kidney function, has 1 remaining kidney    Patient Measurements: Height: 5' 6 (167.6 cm) Weight: 59.8 kg (131 lb 13.4 oz) IBW/kg (Calculated) : 59.3 HEPARIN  DW (KG): 57  Vital Signs: Temp: 97.5 F (36.4 C) (09/20 0738) Temp Source: Oral (09/20 0738) BP: 132/58 (09/20 1620) Pulse Rate: 59 (09/20 1620)  Labs: Recent Labs    01/22/24 0512 01/22/24 1105 01/22/24 1105 01/23/24 0525 01/24/24 1002  HGB  --  9.7*   < > 10.5* 10.1*  HCT  --  32.2*  --  34.7* 33.7*  PLT  --  54*  --  55* 36*  CREATININE 2.05*  --   --  2.13* 2.35*   < > = values in this interval not displayed.    Estimated Creatinine Clearance: 15.8 mL/min (A) (by C-G formula based on SCr of 2.35 mg/dL (H)).   Medical History: Past Medical History:  Diagnosis Date   (HFpEF) heart failure with preserved ejection fraction (HCC)    EF=60-60%   Actinic keratosis 01/17/2015   R forearm   Adjustment disorder with anxiety    Adverse effect of other narcotics, sequela    Intolerance to all narcotics   Anti-Duffy  antibodies present    Aortic valve stenosis    Arthritis    some in my hands (11/11/2012)   Atrial fibrillation, permanent (HCC)    Eliquis    Atypical mole 03/25/2018   L forearm - severe   Automatic implantable cardioverter-defibrillator in situ    Avascular necrosis of hip (HCC) 05/03/2011   Carotid artery stenosis 09/2007   a. 09/2007: 60-79% bilateral (stable); b. 10/2008: 40-59% R 60-79%    Cellulitis of left lower extremity 07/13/2020   CKD (chronic kidney disease), stage III (HCC)    COPD (chronic obstructive pulmonary disease) (HCC)    Coronary artery disease    non-obstructive by 2006 cath   COVID-19 virus infection 02/12/2022   Displaced fracture of left femoral neck (HCC) 10/09/2018   GI bleed 03/28/2020   AVM   High cholesterol    Hypertension 05/20/2011   Hypertr obst cardiomyop    Hypotension, unspecified    cardiac cath 2006..nonobstructive CAD 30-40s lesions.SABRAETT 1/09 nondiagnostic due to poor HR response..Right Renal Cancer 2003   Iron  deficiency anemia    Long term (current) use of anticoagulants    On home oxygen  therapy    3L [place]   Osteoarthritis of right hip    PAD (peripheral artery disease) (HCC)    Pancreatic cancer (HCC)  sandostatin    PONV (postoperative nausea and vomiting)    Presence of permanent cardiac pacemaker    Pulmonary HTN (HCC)    RECTAL BLEEDING 10/13/2009   Qualifier: Diagnosis of  By: [place] NP, Paula     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 Must be transfused with PRB which are Duffy Antigen Negative and Crossmatch Compatible   Renal cell carcinoma (HCC) 2005   s/p right nephrectomy   Squamous cell carcinoma of skin 01/17/2015   R lat wrist   Squamous cell carcinoma of skin 01/29/2018   R post upper leg - superficially invasive   Squamous cell carcinoma of skin 03/25/2018   L lat foot   Squamous cell carcinoma of skin 11/09/2020   left lat foot - EDC  01/01/21, recurrent 01/31/21 - MOHs 02/22/21   Squamous cell carcinoma of skin 12/19/2021   Right Posterior Medial Thigh, EDC   Squamous cell carcinoma of skin 12/19/2021   SCC IS, L lat heel, EDC 02/04/2022   Squamous cell carcinoma of skin 01/21/2022   L forearm, EDC 02/04/2022   Squamous cell carcinoma of skin 01/21/2022   R lower leg below knee, EDC 02/04/2022   Squamous cell carcinoma of skin 01/21/2022   SCCIS, R post heel, EDC 02/04/2022   Squamous cell carcinoma of skin 07/01/2022   left lower abdomen, in situ, EDC   Squamous cell carcinoma of skin 07/01/2022   left medial chest, EDC   Urge incontinence    Venous stasis of both lower extremities     Medications:  Previously on enoxaparin  30 mg SQ q24h (9/5-9/18)  Assessment: Patient is a 89 year old female with a past medical history of pancreatic neuroendocrine tumor on chemotherapy, peripheral vascular disease, heart failure with preserved ejection fraction, chronic kidney failure stage III, history of nephrectomy, pulmonary hypertension, chronic stasis dermatitis, essential hypertension, COPD, atrial fibrillation, squamous cell carcinoma of the skin, hypertrophic cardiomyopathy, hyperlipidemia, normocytic anemia, on chronic oxygen  at home, chronic cellulitis of lower extremity and left lower extremity wound. She was sent to hospital for IV antibiotics for her 2 lower extremity ulcers with cultures growing MRSA and Corynebacterium.  Patient was started on enoxaparin  for DVT prophylaxis and now her PLTs have decreased to 36 (208 on 9/5). 4T score: 3 (low probability of hit; platelet count fall >50% AND platelet nadir >/= 20, onset after 10 days, no thrombosis, definite other causes for thrombocytopenia). Reached out to MD to confirm there is no concern for active thrombosis, no response. Will hold off on loading patient per protocol.   Hgb 10.1. No signs/symptoms of bleeding noted in chart.  Note patient has a history of afib not  on anticoagulation given history of GI bleeds.  Goal of Therapy:  Monitor platelets by anticoagulation protocol: Yes   Plan:  Start apixaban  5 mg PO twice daily Monitor CBC and Scr weekly unless more frequent monitoring is needed Re-evaluate when/if HIT ruled out given history of GI bleeds  Lum VEAR Mania, PharmD, BCPS 01/24/2024,6:12 PM

## 2024-01-24 NOTE — Progress Notes (Signed)
 PROGRESS NOTE    [name]  FMW:[id number] DOB: [birth year] DOA: 01/09/2024 PCP: [name], MD  132A/132A-AA  LOS: 15 days   Brief hospital course:   Assessment & Plan: [name] is a 89 y.o. 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 Patient has 2 lower extremity ulcers, poor healing.  Has been on antibiotics, cultures growing MRSA and Corynebacterium, was sent to the hospital for IV antibiotics.    Infected left lower extremity wound with surrounding cellulitis - outpatient culture apparently growing MRSA and corynebacterium - No evidence of DVT or osteomyelitis - Seen by WOC, recommend debridement - Seen by vascular surgery, intraoperative debridement would be appropriate, unclear etiology - Discussed with oncology, pt will not be able to do debridement for 4 to 6 weeks as patient has been on Avastin .  If there is plan for intervention, we will have to hold Avastin  and anemia will get worse - Seen by orthopedics, appreciate recs.  No signs of bone involvement - Seen by ID.  Pt completed 10 days of linezolid   Hypokalemia - resolved --supplement PRN   AKI on CKD 3b --Cr stable [name] is currently on torsemide  100 mg daily.  Will hold tomorrow's dose given poor oral intake  Delirium  Patient likely with hospital delirium and sundowning.  [name] does not appear to have active infection that could be contributing to [name] symptoms.  Will hold on  further antipsychotics given patient's drowsiness with dose of medication [name] received overnight.   Normocytic anemia Patient's hemoglobin remains stable.  And [name] white blood cell count has normalized.  [name] platelets are now decreased to 36,000.    Latest Ref Rng & Units 01/24/2024   10:02 AM 01/23/2024    5:25 AM 01/22/2024   11:05 AM  CBC  WBC 4.0 - 10.5 K/uL 5.0  4.8  2.8   Hemoglobin 12.0 - 15.0 g/dL 89.8  89.4  9.7   Hematocrit 36.0 - 46.0 % 33.7  34.7  32.2   Platelets 150 - 400 K/uL 36  55  54   Continue to monitor platelet count daily, considered that this is related to heparin  product administration.  Continue to hold. Will discuss with pharmacy alternatives   Chronic diastolic heart failure Hypertrophic cardiomyopathy Status post ICD placement.   Pulmonary HTN -- Hold torsemide  100 mg daily in the a.m.   COPD with chronic hypoxic respiratory failure On home oxygen  2L [place] --stable   Hx pancreatic neuroendocrine cancer Patient is on chemotherapy.   Continue follow-up with oncologist outpatient   Hyperlipidemia:  --cont statin   CAD:  --cont statin   Peripheral vascular disease:  --cont statin  Hyponatremia Resolved  Severe malnutrition in context of chronic illness  --supplements per dietician  Permanent Afib --not on anticoagulation due to GI bleeds --rate controlled not no nodal agents  Vulvovaginal candidiasis Per nursing staff.  Has had course of antibiotics recently. Will give a single dose of fluconazole  adjusted for [name] renal status DVT prophylaxis: Will discuss with pharmacy given possible HIT Code Status: DNR  Family Communication:  Level of care: Med-Surg Dispo:   The  patient is from: home Anticipated d/c is to: Insurance approved SNF, will DC tomorrow  Anticipated d/c date is: Tomorrow   Subjective and Interval History:  Patient not transferred to facility overnight.  It appears [name] had some confusion and family did not want [name]  transferred at that time.  [name] received medication overnight given [name] hallucinations and this has made [name] drowsy.  Patient does wake to voice but does not clearly communicate.  Objective: Vitals:   01/24/24 0336 01/24/24 0500 01/24/24 0738 01/24/24 1620  BP: (!) 142/86  (!) 122/53 (!) 132/58  Pulse: 76  61 (!) 59  Resp: 14  17 14   Temp: 98 F (36.7 C)  (!) 97.5 F (36.4 C)   TempSrc:   Oral   SpO2: 91%  100% 100%  Weight:  59.8 kg    Height:       No intake or output data in the 24 hours ending 01/24/24 1740  Filed Weights   01/21/24 0500 01/22/24 0500 01/24/24 0500  Weight: 53.4 kg 53.4 kg 59.8 kg    Examination:   Physical Exam  Constitutional: In no distress.  Drowsy. Cardiovascular: Normal rate, regular rhythm. No lower extremity edema  Pulmonary: Non labored breathing on nasal cannula, no wheezing or rales.   Abdominal: Soft. Non distended and non tender Musculoskeletal: Normal range of motion.     Neurological: Very drowsy, does not participate well with exam. Skin: Skin is warm and dry.     Data Reviewed: I have personally reviewed labs and imaging studies     Latest Ref Rng & Units 01/24/2024   10:02 AM 01/23/2024    5:25 AM 01/22/2024   11:05 AM  CBC  WBC 4.0 - 10.5 K/uL 5.0  4.8  2.8   Hemoglobin 12.0 - 15.0 g/dL 89.8  89.4  9.7   Hematocrit 36.0 - 46.0 % 33.7  34.7  32.2   Platelets 150 - 400 K/uL 36  55  54     Time spent: 35 minutes  Alban Pepper, MD [facility] If 7PM-7AM, please contact night-coverage 01/24/2024, 5:40 PM

## 2024-01-24 NOTE — TOC CAGE-AID Note (Deleted)
 Transition of Care [facility]) - CAGE-AID Screening   Patient Details  Name: [name] MRN: [id number] Date of Birth: 07-Jan-1934  Transition of Care [facility]) CM/SW Contact:    Edsel DELENA Fischer, LCSW Phone Number: 01/24/2024, 10:48 AM   Clinical Narrative:     CAGE-AID Screening:

## 2024-01-25 ENCOUNTER — Inpatient Hospital Stay

## 2024-01-25 DIAGNOSIS — S81802D Unspecified open wound, left lower leg, subsequent encounter: Secondary | ICD-10-CM | POA: Diagnosis not present

## 2024-01-25 DIAGNOSIS — I5033 Acute on chronic diastolic (congestive) heart failure: Secondary | ICD-10-CM

## 2024-01-25 LAB — BASIC METABOLIC PANEL WITH GFR
Anion gap: 10 (ref 5–15)
Anion gap: 10 (ref 5–15)
BUN: 80 mg/dL — ABNORMAL HIGH (ref 8–23)
BUN: 73 mg/dL — ABNORMAL HIGH (ref 8–23)
CO2: 31 mmol/L (ref 22–32)
CO2: 33 mmol/L — ABNORMAL HIGH (ref 22–32)
Calcium: 9.1 mg/dL (ref 8.9–10.3)
Calcium: 8.9 mg/dL (ref 8.9–10.3)
Chloride: 97 mmol/L — ABNORMAL LOW (ref 98–111)
Chloride: 96 mmol/L — ABNORMAL LOW (ref 98–111)
Creatinine, Ser: 2.53 mg/dL — ABNORMAL HIGH (ref 0.44–1.00)
Creatinine, Ser: 2.35 mg/dL — ABNORMAL HIGH (ref 0.44–1.00)
GFR, Estimated: 18 mL/min — ABNORMAL LOW (ref >60)
GFR, Estimated: 20 mL/min — ABNORMAL LOW (ref >60)
Glucose, Bld: 109 mg/dL — ABNORMAL HIGH (ref 70–99)
Glucose, Bld: 109 mg/dL — ABNORMAL HIGH (ref 70–99)
Potassium: 3.8 mmol/L (ref 3.5–5.1)
Potassium: 3.6 mmol/L (ref 3.5–5.1)
Sodium: 138 mmol/L (ref 135–145)
Sodium: 139 mmol/L (ref 135–145)

## 2024-01-25 LAB — CBC
HCT: 30.3 % — ABNORMAL LOW (ref 36.0–46.0)
Hemoglobin: 9.6 g/dL — ABNORMAL LOW (ref 12.0–15.0)
MCH: 24.9 pg — ABNORMAL LOW (ref 26.0–34.0)
MCHC: 31.7 g/dL (ref 30.0–36.0)
MCV: 78.7 fL — ABNORMAL LOW (ref 80.0–100.0)
Platelets: 57 K/uL — ABNORMAL LOW (ref 150–400)
RBC: 3.85 MIL/uL — ABNORMAL LOW (ref 3.87–5.11)
RDW: 19.3 % — ABNORMAL HIGH (ref 11.5–15.5)
WBC: 5.4 K/uL (ref 4.0–10.5)
nRBC: 0 % (ref 0.0–0.2)

## 2024-01-25 LAB — MAGNESIUM: Magnesium: 2.3 mg/dL (ref 1.7–2.4)

## 2024-01-25 MED ORDER — MAGNESIUM HYDROXIDE 400 MG/5ML PO SUSP
30.0000 mL | Freq: Every day | ORAL | Status: DC | PRN
  Filled 2024-01-25: qty 30

## 2024-01-25 MED ORDER — POLYETHYLENE GLYCOL 3350 17 G PO PACK
17.0000 g | PACK | Freq: Every day | ORAL | Status: DC
  Administered 2024-01-25 – 2024-01-26 (×2): 17 g via ORAL
  Filled 2024-01-25 (×2): qty 1

## 2024-01-25 MED ORDER — QUETIAPINE FUMARATE 25 MG PO TABS: 12.5000 mg | ORAL_TABLET | Freq: Every evening | ORAL | Status: DC | PRN

## 2024-01-25 MED ORDER — FLEET ENEMA RE ENEM: 1.0000 | ENEMA | Freq: Every day | RECTAL | Status: DC | PRN

## 2024-01-25 MED ORDER — APIXABAN 2.5 MG PO TABS
2.5000 mg | ORAL_TABLET | Freq: Two times a day (BID) | ORAL | Status: DC
  Administered 2024-01-25 (×2): 2.5 mg via ORAL
  Filled 2024-01-25 (×3): qty 1

## 2024-01-25 MED ORDER — SODIUM CHLORIDE 0.9 % IV BOLUS
500.0000 mL | Freq: Once | INTRAVENOUS | Status: AC
  Administered 2024-01-25: 500 mL via INTRAVENOUS

## 2024-01-25 NOTE — Plan of Care (Signed)
  Problem: Education: Goal: Knowledge of General Education information will improve Description: Including pain rating scale, medication(s)/side effects and non-pharmacologic comfort measures Outcome: Progressing   Problem: Clinical Measurements: Goal: Diagnostic test results will improve Outcome: Progressing   Problem: Coping: Goal: Level of anxiety will decrease Outcome: Progressing   Problem: Pain Managment: Goal: General experience of comfort will improve and/or be controlled Outcome: Progressing

## 2024-01-25 NOTE — TOC Progression Note (Signed)
 Transition of Care [facility]) - Progression Note    Patient Details  Name: [name] MRN: [id number] Date of Birth: 05-24-1933  Transition of Care [facility]) CM/SW Contact  [place], [place] Phone Number: 01/25/2024, 11:52 AM  Clinical Narrative:     CSW contacted [facility]TA and spoke with Geni. Geni advised that the shara is good until the 23rd and patient will be able to discharge to [facility] tomorrow if medically stable for discharge.     Barriers to Discharge: No Barriers Identified               Expected Discharge Plan and Services         Expected Discharge Date: 01/23/24                                     Social Drivers of Health (SDOH) Interventions SDOH Screenings   Food Insecurity: No Food Insecurity (01/09/2024)  Housing: Low Risk  (01/09/2024)  Transportation Needs: No Transportation Needs (01/09/2024)  Utilities: Not At Risk (01/09/2024)  Alcohol Screen: Low Risk  (11/06/2023)  Depression (PHQ2-9): Low Risk  (12/08/2023)  Financial Resource Strain: Low Risk  (11/06/2023)  Physical Activity: Insufficiently Active (11/06/2023)  Social Connections: Socially Isolated (01/09/2024)  Stress: No Stress Concern Present (11/06/2023)  Tobacco Use: Medium Risk (01/09/2024)  Health Literacy: Adequate Health Literacy (11/06/2023)    Readmission Risk Interventions    06/17/2023   10:07 AM 01/31/2023    3:53 PM 11/09/2022   12:00 PM  Readmission Risk Prevention Plan  Transportation Screening Complete Complete Complete  PCP or Specialist Appt within 3-5 Days   Complete  HRI or Home Care Consult  Complete Complete  Social Work Consult for Recovery Care Planning/Counseling   Complete  Palliative Care Screening  Not Applicable   Medication Review Oceanographer)  Complete Complete  HRI or Home Care Consult Complete    SW Recovery Care/Counseling Consult Complete    Palliative Care Screening Not Applicable    Skilled Nursing Facility Not Applicable

## 2024-01-25 NOTE — Progress Notes (Signed)
 PROGRESS NOTE    [name]  FMW:[id number] DOB: 06-03-1933 DOA: 01/09/2024 PCP: [name], MD  132A/132A-AA  LOS: 16 days   Brief hospital course: [name] is a 89 y.o. 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 Patient has 2 lower extremity ulcers, poor healing.  Has been on antibiotics, cultures growing MRSA and Corynebacterium, was sent to the hospital for IV antibiotics.   Assessment & Plan:    Infected left lower extremity wound with surrounding cellulitis - outpatient culture apparently growing MRSA and corynebacterium - No evidence of DVT or osteomyelitis - Seen by WOC, recommend debridement - Seen by vascular surgery, intraoperative debridement would be appropriate, unclear etiology - Discussed with oncology, pt will not be able to do debridement for 4 to 6 weeks as patient has been on Avastin .  If there is plan for intervention, we will have to hold Avastin  and anemia will get worse - Seen by orthopedics, appreciate recs.  No signs of bone involvement - Seen by ID.  S/p 10 days of linezolid   Hypokalemia - resolved --supplement PRN   AKI on CKD 3b Creatinine worsening this a.m. Likely due to poor p.o. intake over the last 24 hours in the setting of drowsiness. Held torsemide  dose this a.m. Resume torsemide  in the a.m.  Delirium  Patient with hospital delirium and sundowning. Low-dose as needed Seroquel  added for  agitation   Normocytic anemia Thrombocytopenia     Latest Ref Rng & Units 01/25/2024    3:13 AM 01/24/2024   10:02 AM 01/23/2024    5:25 AM  CBC  WBC 4.0 - 10.5 K/uL 5.4  5.0  4.8   Hemoglobin 12.0 - 15.0 g/dL 9.6  89.8  89.4   Hematocrit 36.0 - 46.0 % 30.3  33.7  34.7   Platelets 150 - 400 K/uL 57  36  55   Hemoglobin stable, thrombocytopenia stable Transition to Eliquis  for VTE prophylaxis Continue to monitor  Chronic diastolic heart failure Hypertrophic cardiomyopathy Status post ICD placement.   Pulmonary HTN -- Resume torsemide  in a.m.   COPD with chronic hypoxic respiratory failure On home oxygen  2L Desha -- Not in acute exacerbation    Hx pancreatic neuroendocrine cancer Patient is on chemotherapy.   Continue follow-up with oncologist outpatient   Hyperlipidemia:  --cont statin   CAD:  --cont statin   Peripheral vascular disease:  --cont statin   Severe malnutrition in context of chronic illness  -- Meal supplements per dietician  Permanent Afib --not on anticoagulation due to GI bleeds --rate controlled not no nodal agents  Vulvovaginal candidiasis Per nursing staff.  Has had course of antibiotics recently. Status post single dose of fluconazole    DVT prophylaxis: Will discuss with pharmacy given possible HIT Code Status: DNR  Family Communication: Discussed with daughter via phone Level of care: Med-Surg Dispo:   The patient is from: home Anticipated d/c is to: Insurance approved SNF, will DC tomorrow  Anticipated d/c date is: Tomorrow   Subjective and Interval History:  Patient mentation improved this a.m.  She is awake and alert and has no acute complaints  Objective:  Vitals:   01/25/24 0500 01/25/24 0505 01/25/24 0714 01/25/24 1604  BP:  (!) 132/57 133/69 (!) 134/58  Pulse:  (!) 59 60 60  Resp:  20 17 16   Temp:  98.7 F (37.1 C) 98.3 F (36.8 C) 98.8 F (37.1 C)  TempSrc:   Oral   SpO2:  100% 100% 100%  Weight: 54.5 kg      Height:       No intake or output data in the 24 hours ending 01/25/24 1659  Filed Weights   01/22/24 0500 01/24/24 0500 01/25/24 0500  Weight: 53.4 kg 59.8 kg 54.5 kg    Examination:   Physical Exam  Constitutional: In no distress.  Chronically ill Chest: ICD present  Cardiovascular: Regular irregular. No lower extremity edema  Pulmonary: Non labored breathing on nasal cannula, no wheezing or rales.   Abdominal: Soft. Non distended and non tender Musculoskeletal: Normal range of motion.     Neurological: Alert and oriented to person, place, and time. Non focal  Skin: Skin is warm and dry.  Bilateral lower extremities bandage clean dry and intact    Data Reviewed: I have personally reviewed labs and imaging studies     Latest Ref Rng & Units 01/25/2024    3:13 AM 01/24/2024   10:02 AM 01/23/2024    5:25 AM  CBC  WBC 4.0 - 10.5 K/uL 5.4  5.0  4.8   Hemoglobin 12.0 - 15.0 g/dL 9.6  89.8  89.4   Hematocrit 36.0 - 46.0 % 30.3  33.7  34.7   Platelets 150 - 400 K/uL 57  36  55     Time spent: 35 minutes  Alban Pepper, MD [facility] If 7PM-7AM, please contact night-coverage 01/25/2024, 4:59 PM

## 2024-01-25 NOTE — Progress Notes (Signed)
 PT Cancellation Note  Patient Details Name: [name] MRN: [id number] DOB: [birth year]   Cancelled Treatment:    Reason Eval/Treat Not Completed: Other (comment) Attempted x 2, soundly sleeping this am.  Returned in pm and x-ray tech in.  Will continue at a later time/date.   Lauraine Gills 01/25/2024, 3:33 PM

## 2024-01-26 ENCOUNTER — Encounter: Admitting: Family

## 2024-01-26 DIAGNOSIS — I5040 Unspecified combined systolic (congestive) and diastolic (congestive) heart failure: Secondary | ICD-10-CM | POA: Diagnosis not present

## 2024-01-26 DIAGNOSIS — J302 Other seasonal allergic rhinitis: Secondary | ICD-10-CM | POA: Diagnosis not present

## 2024-01-26 DIAGNOSIS — R001 Bradycardia, unspecified: Secondary | ICD-10-CM | POA: Diagnosis not present

## 2024-01-26 DIAGNOSIS — A4902 Methicillin resistant Staphylococcus aureus infection, unspecified site: Secondary | ICD-10-CM | POA: Diagnosis not present

## 2024-01-26 DIAGNOSIS — N1832 Chronic kidney disease, stage 3b: Secondary | ICD-10-CM | POA: Diagnosis not present

## 2024-01-26 DIAGNOSIS — Z7401 Bed confinement status: Secondary | ICD-10-CM | POA: Diagnosis not present

## 2024-01-26 DIAGNOSIS — I272 Pulmonary hypertension, unspecified: Secondary | ICD-10-CM | POA: Diagnosis not present

## 2024-01-26 DIAGNOSIS — J9611 Chronic respiratory failure with hypoxia: Secondary | ICD-10-CM | POA: Diagnosis not present

## 2024-01-26 DIAGNOSIS — R410 Disorientation, unspecified: Secondary | ICD-10-CM | POA: Diagnosis not present

## 2024-01-26 DIAGNOSIS — D3A8 Other benign neuroendocrine tumors: Secondary | ICD-10-CM | POA: Diagnosis not present

## 2024-01-26 DIAGNOSIS — I2510 Atherosclerotic heart disease of native coronary artery without angina pectoris: Secondary | ICD-10-CM | POA: Diagnosis not present

## 2024-01-26 DIAGNOSIS — I5089 Other heart failure: Secondary | ICD-10-CM | POA: Diagnosis not present

## 2024-01-26 DIAGNOSIS — S81802A Unspecified open wound, left lower leg, initial encounter: Secondary | ICD-10-CM | POA: Diagnosis not present

## 2024-01-26 DIAGNOSIS — N184 Chronic kidney disease, stage 4 (severe): Secondary | ICD-10-CM | POA: Diagnosis present

## 2024-01-26 DIAGNOSIS — E43 Unspecified severe protein-calorie malnutrition: Secondary | ICD-10-CM | POA: Diagnosis not present

## 2024-01-26 DIAGNOSIS — Z905 Acquired absence of kidney: Secondary | ICD-10-CM | POA: Diagnosis not present

## 2024-01-26 DIAGNOSIS — I480 Paroxysmal atrial fibrillation: Secondary | ICD-10-CM | POA: Diagnosis not present

## 2024-01-26 DIAGNOSIS — I70248 Atherosclerosis of native arteries of left leg with ulceration of other part of lower left leg: Secondary | ICD-10-CM | POA: Diagnosis not present

## 2024-01-26 DIAGNOSIS — D631 Anemia in chronic kidney disease: Secondary | ICD-10-CM | POA: Diagnosis present

## 2024-01-26 DIAGNOSIS — D61818 Other pancytopenia: Secondary | ICD-10-CM | POA: Diagnosis not present

## 2024-01-26 DIAGNOSIS — I739 Peripheral vascular disease, unspecified: Secondary | ICD-10-CM | POA: Diagnosis not present

## 2024-01-26 DIAGNOSIS — R2689 Other abnormalities of gait and mobility: Secondary | ICD-10-CM | POA: Diagnosis not present

## 2024-01-26 DIAGNOSIS — Z79899 Other long term (current) drug therapy: Secondary | ICD-10-CM | POA: Diagnosis not present

## 2024-01-26 DIAGNOSIS — Z5112 Encounter for antineoplastic immunotherapy: Secondary | ICD-10-CM | POA: Diagnosis not present

## 2024-01-26 DIAGNOSIS — M6281 Muscle weakness (generalized): Secondary | ICD-10-CM | POA: Diagnosis not present

## 2024-01-26 DIAGNOSIS — J449 Chronic obstructive pulmonary disease, unspecified: Secondary | ICD-10-CM | POA: Diagnosis not present

## 2024-01-26 DIAGNOSIS — L988 Other specified disorders of the skin and subcutaneous tissue: Secondary | ICD-10-CM | POA: Diagnosis not present

## 2024-01-26 DIAGNOSIS — I5033 Acute on chronic diastolic (congestive) heart failure: Secondary | ICD-10-CM | POA: Diagnosis not present

## 2024-01-26 DIAGNOSIS — D649 Anemia, unspecified: Secondary | ICD-10-CM | POA: Diagnosis not present

## 2024-01-26 DIAGNOSIS — L97429 Non-pressure chronic ulcer of left heel and midfoot with unspecified severity: Secondary | ICD-10-CM | POA: Diagnosis not present

## 2024-01-26 DIAGNOSIS — E782 Mixed hyperlipidemia: Secondary | ICD-10-CM | POA: Diagnosis not present

## 2024-01-26 DIAGNOSIS — I429 Cardiomyopathy, unspecified: Secondary | ICD-10-CM | POA: Diagnosis not present

## 2024-01-26 DIAGNOSIS — I10 Essential (primary) hypertension: Secondary | ICD-10-CM | POA: Diagnosis not present

## 2024-01-26 DIAGNOSIS — I5032 Chronic diastolic (congestive) heart failure: Secondary | ICD-10-CM | POA: Diagnosis not present

## 2024-01-26 DIAGNOSIS — Z515 Encounter for palliative care: Secondary | ICD-10-CM | POA: Diagnosis not present

## 2024-01-26 DIAGNOSIS — I70245 Atherosclerosis of native arteries of left leg with ulceration of other part of foot: Secondary | ICD-10-CM | POA: Diagnosis not present

## 2024-01-26 DIAGNOSIS — E440 Moderate protein-calorie malnutrition: Secondary | ICD-10-CM | POA: Diagnosis not present

## 2024-01-26 DIAGNOSIS — M109 Gout, unspecified: Secondary | ICD-10-CM | POA: Diagnosis not present

## 2024-01-26 DIAGNOSIS — L03116 Cellulitis of left lower limb: Secondary | ICD-10-CM | POA: Diagnosis not present

## 2024-01-26 DIAGNOSIS — C254 Malignant neoplasm of endocrine pancreas: Secondary | ICD-10-CM | POA: Diagnosis not present

## 2024-01-26 DIAGNOSIS — L0390 Cellulitis, unspecified: Secondary | ICD-10-CM | POA: Diagnosis not present

## 2024-01-26 DIAGNOSIS — M1990 Unspecified osteoarthritis, unspecified site: Secondary | ICD-10-CM | POA: Diagnosis not present

## 2024-01-26 DIAGNOSIS — C7A8 Other malignant neuroendocrine tumors: Secondary | ICD-10-CM | POA: Diagnosis not present

## 2024-01-26 DIAGNOSIS — S81802D Unspecified open wound, left lower leg, subsequent encounter: Secondary | ICD-10-CM | POA: Diagnosis not present

## 2024-01-26 DIAGNOSIS — R54 Age-related physical debility: Secondary | ICD-10-CM | POA: Diagnosis not present

## 2024-01-26 DIAGNOSIS — K5520 Angiodysplasia of colon without hemorrhage: Secondary | ICD-10-CM | POA: Diagnosis not present

## 2024-01-26 DIAGNOSIS — E785 Hyperlipidemia, unspecified: Secondary | ICD-10-CM | POA: Diagnosis not present

## 2024-01-26 LAB — HEPATIC FUNCTION PANEL
ALT: 16 U/L (ref 0–44)
AST: 32 U/L (ref 15–41)
Albumin: 3.6 g/dL (ref 3.5–5.0)
Alkaline Phosphatase: 57 U/L (ref 38–126)
Bilirubin, Direct: 0.2 mg/dL (ref 0.0–0.2)
Indirect Bilirubin: 0.7 mg/dL (ref 0.3–0.9)
Total Bilirubin: 0.9 mg/dL (ref 0.0–1.2)
Total Protein: 7.4 g/dL (ref 6.5–8.1)

## 2024-01-26 LAB — CBC
HCT: 31.5 % — ABNORMAL LOW (ref 36.0–46.0)
Hemoglobin: 9.5 g/dL — ABNORMAL LOW (ref 12.0–15.0)
MCH: 24.4 pg — ABNORMAL LOW (ref 26.0–34.0)
MCHC: 30.2 g/dL (ref 30.0–36.0)
MCV: 80.8 fL (ref 80.0–100.0)
Platelets: 38 K/uL — ABNORMAL LOW (ref 150–400)
RBC: 3.9 MIL/uL (ref 3.87–5.11)
RDW: 18.6 % — ABNORMAL HIGH (ref 11.5–15.5)
WBC: 5 K/uL (ref 4.0–10.5)
nRBC: 0 % (ref 0.0–0.2)

## 2024-01-26 LAB — HEPATITIS C ANTIBODY: HCV Ab: NONREACTIVE

## 2024-01-26 LAB — TECHNOLOGIST SMEAR REVIEW: Plt Morphology: NONE SEEN

## 2024-01-26 LAB — PROTIME-INR
INR: 1.4 — ABNORMAL HIGH (ref 0.8–1.2)
Prothrombin Time: 18.2 s — ABNORMAL HIGH (ref 11.4–15.2)

## 2024-01-26 LAB — APTT: aPTT: 45 s — ABNORMAL HIGH (ref 24–36)

## 2024-01-26 LAB — BASIC METABOLIC PANEL WITH GFR
Anion gap: 12 (ref 5–15)
BUN: 64 mg/dL — ABNORMAL HIGH (ref 8–23)
CO2: 32 mmol/L (ref 22–32)
Calcium: 8.9 mg/dL (ref 8.9–10.3)
Chloride: 96 mmol/L — ABNORMAL LOW (ref 98–111)
Creatinine, Ser: 2.12 mg/dL — ABNORMAL HIGH (ref 0.44–1.00)
GFR, Estimated: 22 mL/min — ABNORMAL LOW (ref >60)
Glucose, Bld: 97 mg/dL (ref 70–99)
Potassium: 3.8 mmol/L (ref 3.5–5.1)
Sodium: 140 mmol/L (ref 135–145)

## 2024-01-26 LAB — HIV ANTIBODY (ROUTINE TESTING W REFLEX): HIV Screen 4th Generation wRfx: NONREACTIVE

## 2024-01-26 MED ORDER — POLYETHYLENE GLYCOL 3350 17 G PO PACK: 17.0000 g | PACK | Freq: Every day | ORAL | Status: DC

## 2024-01-26 MED ORDER — FLEET ENEMA RE ENEM: 1.0000 | ENEMA | Freq: Every day | RECTAL | Status: DC | PRN

## 2024-01-26 MED ORDER — TORSEMIDE 100 MG PO TABS
100.0000 mg | ORAL_TABLET | Freq: Every day | ORAL | Status: DC
  Administered 2024-01-26: 100 mg via ORAL
  Filled 2024-01-26: qty 1

## 2024-01-26 NOTE — Plan of Care (Signed)
  Problem: Education: Goal: Knowledge of General Education information will improve Description: Including pain rating scale, medication(s)/side effects and non-pharmacologic comfort measures Outcome: Progressing   Problem: Clinical Measurements: Goal: Diagnostic test results will improve Outcome: Progressing   Problem: Nutrition: Goal: Adequate nutrition will be maintained Outcome: Progressing   Problem: Coping: Goal: Level of anxiety will decrease Outcome: Progressing   Problem: Pain Managment: Goal: General experience of comfort will improve and/or be controlled Outcome: Progressing   Problem: Safety: Goal: Ability to remain free from injury will improve Outcome: Progressing

## 2024-01-26 NOTE — Progress Notes (Signed)
 Report called to Vernell at [facility] at [phone]. All questions answered. Patient discharging with medical transport.

## 2024-01-26 NOTE — Hospital Course (Signed)
 Hi Dr. Lanny [name] is an 89 y.o. female with medical history significant of pancreatic neuroendocrine tumor on chemotherapy who follows with you as well as CKD 3b, and HFpEF with severe TR.   She was admitted to the hospital for IV antibiotics for her LE wounds.   On the day she was initially supposed to discharge 9/17 she was found to have an acutely worsened platelet count 79 from 159.    There was thought it could be related to heparin  products and HIT ab was sent which was negative (0.100 OD). Her platelet count has continued to decrease with a nadir of 36 and most recently 38k.   Her other cell lines have been stable. Her most recent hgb was 9.5 and WBC count is normal (5).   I am getting a few other labs on her and she is currently not receiving any medications that she does not receive outpatient. I am reaching to see if you think she would be fine for discharge and to have close follow up with you for this?      peripheral vascular disease, heart failure with preserved ejection fraction, severe TR, chronic kidney failure stage IIIb4, history of nephrectomy, pulmonary hypertension, chronic stasis dermatitis, essential hypertension, COPD, atrial fibrillation, squamous cell carcinoma of the skin, hypertrophic cardiomyopathy, hyperlipidemia, normocytic anemia, on chronic oxygen  at home, chronic lower extremity wounds and left lower extremity wound, follows at [facility]

## 2024-01-26 NOTE — Progress Notes (Signed)
 Occupational Therapy Treatment Patient Details Name: [name] MRN: [id number] DOB: [birth year] Today's Date: 01/26/2024   History of present illness Pt is a 89 y/o F presenting to ED with c/o MRSA in LLE, BLE wounds. Imaging negative for DVT, osteomyelitis. PMH significant for PVD, pancreatic CA on chemotherapy, HRpEF, CKD-III, hx of nephrectomy, squamous cell carcinoma, HLD, pacemaker, COPD with chronic O2 use at home.   OT comments  Pt seen for OT tx. Pt sleeping, nasal cannula down around her chin. Pt instructed to reapply, SpO2 91% and improving with O2 replaced. MD requesting wrist splint for R wrist pain. Pt endorses minimal pain when at rest, RUE propped on pillow. Significant pain with any touch or wrist ROM. Able to move fingers without much pain. R wrist splint placed. Pt endorsing discomfort at rest with splint in place but willing to trial it given increased pain with wrist movement. Loosened for comfort. Pt endorsing 7/10 R wrist pain. RN notified. MD notified of splint in place. Will continue to progress as able.      If plan is discharge home, recommend the following:  A little help with walking and/or transfers;A lot of help with bathing/dressing/bathroom;Assistance with cooking/housework;Assist for transportation;Help with stairs or ramp for entrance   Equipment Recommendations  BSC/3in1    Recommendations for Other Services      Precautions / Restrictions Precautions Precautions: Fall;ICD/Pacemaker Recall of Precautions/Restrictions: Impaired Precaution/Restrictions Comments: pacemaker, BLE wounds Restrictions Weight Bearing Restrictions Per Provider Order: No       Mobility Bed Mobility                    Transfers                         Balance                                           ADL either performed or assessed with clinical judgement   ADL                                               Extremity/Trunk Assessment              Vision       Perception     Praxis     Communication     Cognition Arousal: Alert Behavior During Therapy: [facility] for tasks assessed/performed Cognition: No family/caregiver present to determine baseline                                        Cueing      Exercises Other Exercises Other Exercises: R wrist splint placed and adjusted. Pt educated on positioning/mgt.    Shoulder Instructions       General Comments      Pertinent Vitals/ Pain       Pain Assessment Pain Assessment: 0-10 Pain Score: 7  Pain Location: R wrist Pain Descriptors / Indicators: Grimacing, Aching Pain Intervention(s): Limited activity within patient's tolerance, Monitored during session, Repositioned, Premedicated before session, Patient requesting pain meds-RN notified  Home Living  Prior Functioning/Environment              Frequency  Min 2X/week        Progress Toward Goals  OT Goals(current goals can now be found in the care plan section)  Progress towards OT goals: Progressing toward goals  Acute Rehab OT Goals Patient Stated Goal: go home OT Goal Formulation: With patient Time For Goal Achievement: 01/29/24 Potential to Achieve Goals: Good  Plan      Co-evaluation                 AM-PAC OT 6 Clicks Daily Activity     Outcome Measure   Help from another person eating meals?: None Help from another person taking care of personal grooming?: A Little Help from another person toileting, which includes using toliet, bedpan, or urinal?: A Little Help from another person bathing (including washing, rinsing, drying)?: A Lot Help from another person to put on and taking off regular upper body clothing?: A Little Help from another person to put on and taking off regular lower body clothing?: A Lot 6 Click Score: 17    End of Session Equipment  Utilized During Treatment: Oxygen   OT Visit Diagnosis: Unsteadiness on feet (R26.81);Pain Pain - Right/Left: Right Pain - part of body: Hand   Activity Tolerance Patient tolerated treatment well   Patient Left in bed;with call bell/phone within reach;with bed alarm set   Nurse Communication Patient requests pain meds;Other (comment) (wrist splint placed)        Time: 8985-8972 OT Time Calculation (min): 13 min  Charges: OT General Charges $OT Visit: 1 Visit OT Treatments $Therapeutic Activity: 8-22 mins  Warren SAUNDERS., MPH, MS, OTR/L ascom [phone] 01/26/24, 10:37 AM

## 2024-01-26 NOTE — Progress Notes (Signed)
   01/26/24 1600  Mobility  Activity Stood at bedside;Dangled on edge of bed  Level of Assistance Independent after set-up  Assistive Device None  Distance Ambulated (ft) 2 ft  Range of Motion/Exercises Active Assistive  Activity Response Tolerated fair  Mobility visit 1 Mobility  Mobility Specialist Start Time (ACUTE ONLY) 0300  Mobility Specialist Stop Time (ACUTE ONLY) 0318  Mobility Specialist Time Calculation (min) (ACUTE ONLY) 18 min   Mobility Specialist - Progress Note Pt was supine in Bed upon entry. Daughter was in the room during mobility. Pt agreed to mobility. Pt did complain about a wrist injury, but did inform Dr. And nurse earlier. Pt also complained about soreness within right arm. Pt was able to EOB with MinA. Pt was able to hold a conversation while performing activity. After activity pt remained in the bed due to her stating that she will be discharged today.  Clem Rodes Mobility Specialist 01/26/24, 4:12 PM

## 2024-01-26 NOTE — Plan of Care (Signed)
 Problem: Education: Goal: Knowledge of General Education information will improve Description: Including pain rating scale, medication(s)/side effects and non-pharmacologic comfort measures 01/26/2024 1714 by Buren Macario HERO, RN Outcome: Adequate for Discharge 01/26/2024 1714 by Buren Macario HERO, RN Outcome: Adequate for Discharge 01/26/2024 1714 by Buren Macario HERO, RN Outcome: Adequate for Discharge   Problem: Health Behavior/Discharge Planning: Goal: Ability to manage health-related needs will improve 01/26/2024 1714 by Buren Macario HERO, RN Outcome: Adequate for Discharge 01/26/2024 1714 by Buren Macario HERO, RN Outcome: Adequate for Discharge 01/26/2024 1714 by Buren Macario HERO, RN Outcome: Adequate for Discharge   Problem: Clinical Measurements: Goal: Ability to maintain clinical measurements within normal limits will improve 01/26/2024 1714 by Buren Macario HERO, RN Outcome: Adequate for Discharge 01/26/2024 1714 by Buren Macario HERO, RN Outcome: Adequate for Discharge 01/26/2024 1714 by Buren Macario HERO, RN Outcome: Adequate for Discharge Goal: Will remain free from infection 01/26/2024 1714 by Buren Macario HERO, RN Outcome: Adequate for Discharge 01/26/2024 1714 by Buren Macario HERO, RN Outcome: Adequate for Discharge 01/26/2024 1714 by Buren Macario HERO, RN Outcome: Adequate for Discharge Goal: Diagnostic test results will improve 01/26/2024 1714 by Buren Macario HERO, RN Outcome: Adequate for Discharge 01/26/2024 1714 by Buren Macario HERO, RN Outcome: Adequate for Discharge 01/26/2024 1714 by Buren Macario HERO, RN Outcome: Adequate for Discharge Goal: Respiratory complications will improve 01/26/2024 1714 by Buren Macario HERO, RN Outcome: Adequate for Discharge 01/26/2024 1714 by Buren Macario HERO, RN Outcome: Adequate for Discharge 01/26/2024 1714 by Buren Macario HERO, RN Outcome: Adequate for Discharge Goal: Cardiovascular complication will be avoided 01/26/2024 1714 by Buren Macario HERO, RN Outcome: Adequate for Discharge 01/26/2024 1714 by  Buren Macario HERO, RN Outcome: Adequate for Discharge 01/26/2024 1714 by Buren Macario HERO, RN Outcome: Adequate for Discharge   Problem: Activity: Goal: Risk for activity intolerance will decrease 01/26/2024 1714 by Buren Macario HERO, RN Outcome: Adequate for Discharge 01/26/2024 1714 by Buren Macario HERO, RN Outcome: Adequate for Discharge 01/26/2024 1714 by Buren Macario HERO, RN Outcome: Adequate for Discharge   Problem: Nutrition: Goal: Adequate nutrition will be maintained 01/26/2024 1714 by Buren Macario HERO, RN Outcome: Adequate for Discharge 01/26/2024 1714 by Buren Macario HERO, RN Outcome: Adequate for Discharge 01/26/2024 1714 by Buren Macario HERO, RN Outcome: Adequate for Discharge   Problem: Coping: Goal: Level of anxiety will decrease 01/26/2024 1714 by Buren Macario HERO, RN Outcome: Adequate for Discharge 01/26/2024 1714 by Buren Macario HERO, RN Outcome: Adequate for Discharge 01/26/2024 1714 by Buren Macario HERO, RN Outcome: Adequate for Discharge   Problem: Elimination: Goal: Will not experience complications related to bowel motility 01/26/2024 1714 by Buren Macario HERO, RN Outcome: Adequate for Discharge 01/26/2024 1714 by Buren Macario HERO, RN Outcome: Adequate for Discharge 01/26/2024 1714 by Buren Macario HERO, RN Outcome: Adequate for Discharge Goal: Will not experience complications related to urinary retention 01/26/2024 1714 by Buren Macario HERO, RN Outcome: Adequate for Discharge 01/26/2024 1714 by Buren Macario HERO, RN Outcome: Adequate for Discharge 01/26/2024 1714 by Buren Macario HERO, RN Outcome: Adequate for Discharge   Problem: Pain Managment: Goal: General experience of comfort will improve and/or be controlled 01/26/2024 1714 by Buren Macario HERO, RN Outcome: Adequate for Discharge 01/26/2024 1714 by Buren Macario HERO, RN Outcome: Adequate for Discharge 01/26/2024 1714 by Buren Macario HERO, RN Outcome: Adequate for Discharge   Problem: Safety: Goal: Ability to remain free from injury will improve 01/26/2024 1714 by Buren Macario HERO, RN Outcome: Adequate for Discharge 01/26/2024 1714 by  Buren Macario HERO, RN Outcome: Adequate for Discharge 01/26/2024 1714 by Buren Macario HERO, RN Outcome: Adequate for Discharge   Problem: Skin Integrity: Goal: Risk for impaired skin integrity will decrease 01/26/2024 1714 by Buren Macario HERO, RN Outcome: Adequate for Discharge 01/26/2024 1714 by Buren Macario HERO, RN Outcome: Adequate for Discharge 01/26/2024 1714 by Buren Macario HERO, RN Outcome: Adequate for Discharge

## 2024-01-27 ENCOUNTER — Ambulatory Visit: Payer: Self-pay

## 2024-01-27 DIAGNOSIS — I70248 Atherosclerosis of native arteries of left leg with ulceration of other part of lower left leg: Secondary | ICD-10-CM | POA: Diagnosis not present

## 2024-01-27 DIAGNOSIS — I70245 Atherosclerosis of native arteries of left leg with ulceration of other part of foot: Secondary | ICD-10-CM | POA: Diagnosis not present

## 2024-01-27 NOTE — Telephone Encounter (Signed)
 FYI Only or Action Required?: FYI only for provider.  Patient was last seen in primary care on 12/01/2023 by [name], MD.  Called Nurse Triage reporting Pain.  Symptoms began yesterday.  Interventions attempted: Nothing.  Symptoms are: stable.  Triage Disposition: Home Care  Patient/caregiver understands and will follow disposition?: Yes Reason for Disposition  Arm pain  Answer Assessment - Initial Assessment Questions Dr. At rehab hospital did xray, found nothing, stated probably inflamed and advised seeing PCP.  Daughter states she's very weak, and would have to go by ambulance, she's not sure what to do. Patient wants to know if she should rely on care team at rehab center or if PCP will collaborate on care. Please advise, call back [phone].  1. ONSET: When did the pain start?     Sunday  2. LOCATION: Where is the pain located?     Right arm, from wrist to elbow  3. PAIN: How bad is the pain? (Scale 0-10; or none, mild, moderate, severe)     In a lot a lot of pain states she can't move it at all  4. CAUSE: What do you think is causing the arm pain?     Unsure  5. OTHER SYMPTOMS: Do you have any other symptoms? (e.g., neck pain, swelling, rash, fever, numbness, weakness)     Visibly swollen  Protocols used: Arm Pain-A-AH  Copied from CRM #[id number]. Topic: Clinical - Red Word Triage >> Jan 27, 2024  2:35 PM Aisha D wrote: Red Word that prompted transfer to Nurse Triage: pain, swelling, weakness  Pt's daughter [name] stated that the pt has been recently discharged from the hospital and is now at [facility] for rehab and wound care. [name] stated that the pt has a swollen arm which she may have gotten from her hospital stay. [name] stated that a provider looked at the arm but didn't find anything and advised her that the arm may be inflamed and to reach out to the pcp. [name] stated that the pt is in a lot of pain and is experiencing weakness.    UPDATE: Call  dropped before I could get [name] to NT. Call back twice no answer, Pls call [name] back at [phone].

## 2024-01-28 ENCOUNTER — Encounter: Admitting: Internal Medicine

## 2024-01-28 DIAGNOSIS — I5089 Other heart failure: Secondary | ICD-10-CM | POA: Diagnosis not present

## 2024-01-28 DIAGNOSIS — R54 Age-related physical debility: Secondary | ICD-10-CM | POA: Diagnosis not present

## 2024-01-28 DIAGNOSIS — J302 Other seasonal allergic rhinitis: Secondary | ICD-10-CM | POA: Diagnosis not present

## 2024-01-28 DIAGNOSIS — Z515 Encounter for palliative care: Secondary | ICD-10-CM | POA: Diagnosis not present

## 2024-01-28 DIAGNOSIS — E782 Mixed hyperlipidemia: Secondary | ICD-10-CM | POA: Diagnosis not present

## 2024-01-28 NOTE — Telephone Encounter (Signed)
 Phone number listed is for [name], daughter who is on the [place].  Attempted to return her phone call, no answer and no voicemail box is set up.

## 2024-01-30 ENCOUNTER — Telehealth: Payer: Self-pay

## 2024-01-30 ENCOUNTER — Telehealth: Payer: Self-pay | Admitting: Family

## 2024-01-30 ENCOUNTER — Ambulatory Visit: Payer: Self-pay

## 2024-01-30 DIAGNOSIS — R54 Age-related physical debility: Secondary | ICD-10-CM | POA: Diagnosis not present

## 2024-01-30 DIAGNOSIS — Z515 Encounter for palliative care: Secondary | ICD-10-CM | POA: Diagnosis not present

## 2024-01-30 DIAGNOSIS — E782 Mixed hyperlipidemia: Secondary | ICD-10-CM | POA: Diagnosis not present

## 2024-01-30 DIAGNOSIS — I5089 Other heart failure: Secondary | ICD-10-CM | POA: Diagnosis not present

## 2024-01-30 DIAGNOSIS — J302 Other seasonal allergic rhinitis: Secondary | ICD-10-CM | POA: Diagnosis not present

## 2024-01-30 NOTE — Telephone Encounter (Signed)
 Called rehab and spoke to [place]. Patient is being rounded on by both wound care nurse as well as wound care provider. We don't need to send orders for any dressing all are given by providers that are treating in rehab. They were not aware of any issues with arm but will have addressed by provider there. We are not able to do virtual with patient while in rehab. Insurance will not cover. Patient does not have DPR on file for our office with anyone listed to talk to. Please advise of next steps.

## 2024-01-30 NOTE — Telephone Encounter (Signed)
 No answer no voice mail

## 2024-01-30 NOTE — Telephone Encounter (Addendum)
 We have not had reliable wifi in the office the last few days so was not able to review messages until now.   However arm pain/swelling is concerning, would definitely want facility providers to evaluate, r/o blood clot (although that's usually swelling and pain to medial upper arm). Doesn't sound quite like ongoing infection in arm but unable to tell without physically seeing pt. I do recommend facility providers direct medical care while pt is in facility, don't know if she may need to return to hospital for further evaluation/management if pain is severe.

## 2024-01-30 NOTE — Telephone Encounter (Signed)
 Copied from CRM [id number]. Topic: Clinical - Request for Lab/Test Order >> Jan 30, 2024 12:32 PM Mia F wrote: Reason for CRM: Pt daughter called stating that pt was in the hospital for a spot of cancer that was on her leg. She says that pt is now at [facility] and she need an order for dressing on the leg. [name] says ti hs to be the same dressing that was used in the hospital. She says they used Med Honey

## 2024-01-30 NOTE — Telephone Encounter (Signed)
 Called to confirm/remind patient of [facility] appointment [facility] on 02/02[facility].   Appointment:   [] Confirmed  [x] Left mess   [] No answer/No voice mail  [] VM Full/unable to leave message  [] Phone not in service  Patient reminded to bring all medications and/or complete list.  Confirmed patient has transportation. Gave directions, instructed to utilize valet parking.

## 2024-01-30 NOTE — Telephone Encounter (Signed)
 Added this to open message. No further action needed on this encounter.

## 2024-01-30 NOTE — Telephone Encounter (Signed)
 FYI Only or Action Required?: Action required by provider: update on patient condition.  Patient was last seen in primary care on 12/01/2023 by [name], MD.  Called Nurse Triage reporting Appointment and Arm Pain.  Symptoms began several days ago.  Interventions attempted: Nothing.  Symptoms are: gradually worsening.  Triage Disposition: Duplicate Contact Calls  Patient/caregiver understands and will follow disposition?: Yes      Copied from CRM #[id number]. Topic: Clinical - Red Word Triage >> Jan 27, 2024  2:35 PM Aisha D wrote: Red Word that prompted transfer to Nurse Triage: pain, swelling, weakness  Pt's daughter [name] stated that the pt has been recently discharged from the hospital and is now at [facility] for rehab and wound care. [name] stated that the pt has a swollen arm which she may have gotten from her hospital stay. [name] stated that a provider looked at the arm but didn't find anything and advised her that the arm may be inflamed and to reach out to the pcp. [name] stated that the pt is in a lot of pain and is experiencing weakness.    UPDATE: Call dropped before I could get [name] to NT. Call back twice no answer, Pls call [name] back at [phone]. >> Jan 30, 2024  1:36 PM [place] J wrote: Patient's daughter is calling to finalize the making of an appointment. >> Jan 30, 2024 12:36 PM Mia F wrote: Pt daughter [name] is calling back and stating pt is still having pain in her hands and arms. She looks like she has inflammation in her and and arms. There isn't any open wounds on the hands or arms. She says pt was originally in the hospital for MRSA. She was told it was gone but she wonder if it could be in her arms  Reason for Disposition  Caller has already spoken with another triager or doctor (or NP/PA, pharmacist) AND has further questions AND triager able to answer questions.    Daughter reports that pt is currently at [facility] s/p hospitalization and  requesting virtual visit with PCP re: arm pain. Triager scheduled virtual visit and daughter will get a Child psychotherapist to aid pt in acute appt.  Of note, daughter states that imaging is available at the facility and hoping that PCP can order imaging to the site.  Triager will forward encounter for Dr Rilla 's office to review and advise. Caregiver verbalized understanding.  Answer Assessment - Initial Assessment Questions 1. REASON FOR CALL: What is the main reason for your call? or How can I best help you?     Pt daughter calling to schedule appt. Reports had spoken to NT already, and wanted to secure virtual appt. Daughter reports that pt is currently at [facility]. 2. SYMPTOMS : Do you have any symptoms?      Arm pain-- see alternate NT encounter. 3. OTHER QUESTIONS: Do you have any other questions?     Daughter reported that a in-house clinician stated that facility has imaging capabilities. Daughter requesting if PCP can order imaging there to help facilitate upcoming virtual appt.  Protocols used: Information Only Call - No Triage-A-AH, No Contact or Duplicate Contact Call-A-AH

## 2024-01-30 NOTE — Telephone Encounter (Signed)
 FYI Only or Action Required?: Action required by provider: Requesting information about a virtual visit .  Patient was last seen in primary care on 12/01/2023 by [name], MD.  Called Nurse Triage reporting Arm Pain.  Symptoms began several days ago.  Interventions attempted: Nothing.  Symptoms are: gradually worsening.  Triage Disposition: See PCP When Office is Open (Within 3 Days)  Patient/caregiver understands and will follow disposition?: Unsure     Copied from CRM #[id number]. Topic: Clinical - Red Word Triage >> Jan 27, 2024  2:35 PM Aisha D wrote: Red Word that prompted transfer to Nurse Triage: pain, swelling, weakness  Pt's daughter [name] stated that the pt has been recently discharged from the hospital and is now at [facility] for rehab and wound care. [name] stated that the pt has a swollen arm which she may have gotten from her hospital stay. [name] stated that a provider looked at the arm but didn't find anything and advised her that the arm may be inflamed and to reach out to the pcp. [name] stated that the pt is in a lot of pain and is experiencing weakness.    UPDATE: Call dropped before I could get [name] to NT. Call back twice no answer, Pls call [name] back at [phone]. >> Jan 30, 2024 12:36 PM Mia F wrote: Pt daughter [name] is calling back and stating pt is still having pain in her hands and arms. She looks like she has inflammation in her and and arms. There isn't any open wounds on the hands or arms. She says pt was originally in the hospital for MRSA. She was told it was gone but she wonder if it could be in her arms  Reason for Disposition  [1] MODERATE pain (e.g., interferes with normal activities) AND [2] present > 3 days    She states pain is severe but has not attempted medications for symptoms. Hand feels weak due to pain.  Answer Assessment - Initial Assessment Questions This RN spoke with patient's daughter regarding patient's symptoms. Patient's  symptoms started after hospital stay around the day before discharge. This RN offered to schedule patient in office. Patient's daughter declined and is  requesting for a virtual appointment in office due to her mother's limitation. Patient's daughter states she will need assistance with virtual visit and states she will return the call to see if one of the nurses can help her with it.    1. ONSET: When did the pain start?     A few days ago  2. LOCATION: Where is the pain located?     Radiates to R hand as well , R arm  3. PAIN: How bad is the pain? (Scale 0-10; or none, mild, moderate, severe)     Severe  4. CAUSE: What do you think is causing the arm pain?     Unsure  5. OTHER SYMPTOMS: Do you have any other symptoms? (e.g., neck pain, swelling, rash, fever, numbness, weakness)     Swelling, arm and hand hard to lift with weakness in hand and arm  Protocols used: Arm Pain-A-AH

## 2024-01-30 NOTE — Telephone Encounter (Signed)
 I have not had reliable wifi the last few days so was not able to review messages until now.   Agree - recommend facility provider to direct medical treatment while there, as I cannot order imaging or wound care when patient is in facility.   See other note regarding arm pain/swelling

## 2024-01-30 NOTE — Telephone Encounter (Signed)
 Called [name] at [phone], unable to reach pt or leave message either.

## 2024-01-30 NOTE — Telephone Encounter (Addendum)
 Called [name] at [phone], unable to reach pt or leave message either.

## 2024-01-30 NOTE — Telephone Encounter (Signed)
 See other note

## 2024-02-01 ENCOUNTER — Other Ambulatory Visit: Payer: Self-pay | Admitting: Nurse Practitioner

## 2024-02-01 DIAGNOSIS — D500 Iron deficiency anemia secondary to blood loss (chronic): Secondary | ICD-10-CM

## 2024-02-01 DIAGNOSIS — D3A8 Other benign neuroendocrine tumors: Secondary | ICD-10-CM

## 2024-02-01 NOTE — Assessment & Plan Note (Signed)
 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   -Outpatient EUS 03/04/2022 by Dr. 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 -She began first line sandostatin  injections q28 days on 03/27/22.  -Dotatate PET scan, done 2 weeks after first injection 04/10/22, shows no significant tracer avidity in the pancrease or distant sites. Findings are likely due to close timing relative to the sandostatin  injection blocking receptors. 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   - 02/02/2024 -administer Sandostatin  and bevacizumab  today.  Plan to continue both medications monthly.

## 2024-02-01 NOTE — Progress Notes (Unsigned)
 Patient Care Team: [name], MD as PCP - General (Family Medicine) [name], MD as PCP - Advanced Heart Failure (Cardiology) [name], MD as PCP - Cardiology (Cardiology) [name], MD as PCP - Electrophysiology (Cardiology) [name], MD as Consulting Physician (Cardiology) [name], MD as Consulting Physician (Cardiology) [name], [name], MD as Consulting Physician (Vascular Surgery) [name], MD as Consulting Physician (Hematology and Oncology) [name], MD as Consulting Physician (Oncology) [name], [facility] (Inactive) as Pharmacist (Pharmacist)  Clinic Day:  02/02/2024  Referring physician: [name], MD  ASSESSMENT & PLAN:   Assessment & Plan: 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   -Outpatient EUS 03/04/2022 by Dr. 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 -She began first line sandostatin  injections q28 days on 03/27/22.  -Dotatate PET scan, done 2 weeks after first injection 04/10/22, shows no significant tracer avidity in the pancrease or distant sites. Findings are likely due to close timing relative to the sandostatin  injection blocking receptors. 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   - 02/02/2024 -administer Sandostatin  and bevacizumab  today.  Plan to continue both medications monthly.   Normocytic anemia Patient currently receiving Aranesp injections every 2 weeks.  Blood counts improving.  Has not needed blood transfusion since 06/23/2023. -07/28/2023 -Hgb 8.2.  Blood transfusion not needed this week.  Recheck labs in 1 week.  Add ferritin. -Continue Aranesp injections every 2 weeks.  Blood transfusions as needed.  2 units PRBCs for hemoglobin <7.0.  1 unit PRBCs for Hgb between 7 and 8. - 02/02/2024 -Hgb 9.0 today.  No requirement for blood transfusion.  Administer Retacrit  today.  Arrange for IV iron  as indicated based on results of ferritin when available.   Recent hospitalization Patient hospitalized from 01/09/2024 through 01/27/2024 due to leg wound of left lower leg and cellulitis.  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  Debridement was recommended by vascular surgery and wound care.  Due to treatment with Avastin , surgery would have to wait 4 to 6 weeks due to medications interference with routine healing.  She had 10 days of linezolid .  She is now staying at [facility].  Wound care coming to her and gently clean and dress wounds of lower leg.  Wounds are gradually drying out though still painful.  We discussed possibility of surgical debridement.  Patient has declined this treatment option.  She prefers to remain on bevacizumab  monthly.  Chronic anemia Patient's hemoglobin was stable throughout her hospitalization.  No Hgb and HCT are lower today, no need for blood transfusion.  Will arrange for IV iron  at [facility] infusion center.  Patient to receive Retacrit  today.  Will continue to administer this every 2 weeks.   Thrombocytopenia Patient's  platelet count had been consistently <100.  Today, platelet count has rebounded to normal at 394.  Chronic kidney disease Likely contributing to patient's  chronic anemia.  Recently worsening.  Will receive Retacrit  to help with reduction RBCs and hemoglobin.  Will continue to monitor Retacrit  every 2 weeks.  Nausea Will give single dose p.o. Zofran  during today's visit.  Neuroendocrine tumor of the pancreas Administer sandostatin  today.  Continue with every 4 weeks administration.  Plan The patient was seen along with Dr. Lanny in infusion suite today.  Labs reviewed. -Stable anemia.  No requirement for blood transfusion. - Improved platelet count. --Administer Retacrit  injection and bevacizumab  infusion today. --Will arrange for IV iron  at [facility] infusion center. Worsening CKD. Administer sandostatin  today. Continue with labs and Retacrit  injection every 2 weeks. Labs, Retacrit , sandostatin , bevacizumab  every 4 weeks.  The patient understands the plans discussed today and is in agreement with them.  She knows to contact our office if she develops concerns prior to her next appointment.  I provided 25 minutes of face-to-face time during this encounter and > 50% was spent counseling as documented under my assessment and plan.    Powell FORBES Lessen, NP  [facility] - A DEPT OF [facility] [address] [place] [address] Dept: [phone] Dept Fax: [phone]   Orders Placed This Encounter  Procedures   CBC with Differential (Cancer Center Only)    Standing Status:   Future    Expected Date:   03/01/2024    Expiration Date:   03/01/2025   Total Protein, Urine dipstick    Standing Status:   Future    Expected Date:   03/01/2024    Expiration Date:   03/01/2025   CBC with Differential (Cancer Center Only)    Standing Status:   Future    Expected Date:   03/29/2024    Expiration Date:   03/29/2025   Total Protein, Urine dipstick    Standing Status:   Future    Expected Date:   03/29/2024    Expiration Date:   03/29/2025      CHIEF COMPLAINT:   CC: Primary pancreatic neuroendocrine tumor; 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  IV iron  as needed  INTERVAL HISTORY:  Kaytlynne is here today for repeat clinical assessment.  She last saw Dr. Lanny on 01/07/2024.  Has since had a lengthy hospital stay due to lower leg infection and cellulitis.  Treated with linezolid .  Bevacizumab  and simvastatin  held while hospitalized.  She feels tired but improved since hospitalization.  She is now living in SNF.  Wound care coming to her home to clean lower extremity wounds and dressed them.  Slight improvement in presentation of lower leg wounds.  Debridement surgery recommended per vascular surgery and wound care.  Patient declines.  Does not want to hold bevacizumab  for now for 6 weeks due to severity of anemia and potential frequency of blood transfusions.  She denies chest pain or chest pressure.  She does have baseline shortness of breath.  Currently on nasal cannula oxygen .  She denies headaches or visual disturbances. She denies abdominal pain, nausea, vomiting, or changes in bowel or bladder habits.   She denies fevers or chills. Her appetite is improving. Her weight has increased 2 pounds over last 2 weeks.  I have reviewed the past medical history,  past surgical history, social history and family history with the patient and they are unchanged from previous note.  ALLERGIES:  is allergic to codeine, dilaudid  [hydromorphone ], tape, amoxil  [amoxicillin ], asa [aspirin ], ms contin  [morphine ], neurontin  [gabapentin ], and nsaids.  MEDICATIONS:  Current Outpatient Medications  Medication Sig Dispense Refill   acetaminophen  (TYLENOL ) 500 MG tablet Take 1,000 mg by mouth every 8 (eight) hours.     Ascorbic Acid  (VITAMIN C  PO) Take 1 tablet by mouth daily.     Cholecalciferol  (VITAMIN D -3 PO) Take 2 capsules by mouth daily.     CRANBERRY PO Take 1 tablet by  mouth daily.     Cyanocobalamin  (B-12 PO) Take 1 tablet by mouth daily.     ferrous gluconate  (FERGON) 324 MG tablet Take 1 tablet (324 mg total) by mouth daily with breakfast. 30 tablet 3   fluticasone  (FLONASE ) 50 MCG/ACT nasal spray Place 2 sprays into both nostrils daily as needed for allergies or rhinitis. 16 g 0   leptospermum manuka honey (MEDIHONEY) PSTE paste Apply 1 Application topically daily.     [Paused] metolazone  (ZAROXOLYN ) 2.5 MG tablet Take 1 tablet (2.5 mg total) by mouth 3 (three) times a week. Every Tues., Thurs and Sat. with 40 meq of potassium 90 tablet 3   Multiple Vitamins-Minerals (HAIR SKIN NAILS PO) Take 1 capsule by mouth daily.     polyethylene glycol (MIRALAX  / GLYCOLAX ) 17 g packet Take 17 g by mouth daily.     Potassium Chloride  ER 20 MEQ TBCR Take 1 tablet (20 mEq total) by mouth daily. 30 tablet 2   rosuvastatin  (CRESTOR ) 10 MG tablet Take 1 tablet (10 mg total) by mouth daily. (Patient not taking: Reported on 12/01/2023) 30 tablet 0   sodium phosphate  (FLEET) ENEM Place 133 mLs (1 enema total) rectally daily as needed for severe constipation.     torsemide  (DEMADEX ) 20 MG tablet Take 5 tablets (100 mg total) by mouth daily.     traMADol  (ULTRAM ) 50 MG tablet Take 1 tablet (50 mg total) by mouth every 12 (twelve) hours as needed. 30 tablet 0   No current facility-administered medications for this visit.       REVIEW OF SYSTEMS:   Constitutional: Denies fevers, chills or abnormal weight loss.  Weakness and fatigue. Eyes: Denies blurriness of vision Ears, nose, mouth, throat, and face: Denies mucositis or sore throat Respiratory: Denies cough, dyspnea or wheezes.  Chronic shortness of breath. Cardiovascular: Denies palpitation, chest discomfort or lower extremity swelling Gastrointestinal:  Denies nausea, heartburn or change in bowel habits Skin: Denies abnormal skin rashes Lymphatics: Denies new lymphadenopathy or easy bruising Neurological:Denies  numbness, tingling or new weaknesses Behavioral/Psych: Mood is stable, no new changes  All other systems were reviewed with the patient and are negative.   VITALS:   Today's Vitals   02/02/24 1547  BP: 134/68  Pulse: 68  Temp: 98.1 F (36.7 C)  SpO2: 100%  Weight: 114 lb (51.7 kg)   Body mass index is 18.4 kg/m.   Wt Readings from Last 3 Encounters:  02/02/24 114 lb (51.7 kg)  02/02/24 114 lb (51.7 kg)  01/26/24 117 lb 1 oz (53.1 kg)    Body mass index is 18.4 kg/m.  Performance status (ECOG): 2 - Symptomatic, <50% confined to bed  PHYSICAL EXAM:   GENERAL:alert, no distress and comfortable SKIN: skin color, texture, turgor are normal, no rashes or significant lesions.  Significant lower extremity wounds.  Currently dried flaking skin.  Left lower extremity  covered by clean and dry dressing.  No evidence of drainage. EYES: normal, Conjunctiva are pink and non-injected, sclera clear OROPHARYNX:no exudate, no erythema and lips, buccal mucosa, and tongue normal  NECK: supple, thyroid  normal size, non-tender, without nodularity LYMPH:  no palpable lymphadenopathy in the cervical, axillary or inguinal LUNGS: clear to auscultation and percussion with normal breathing effort HEART: regular rate & rhythm and no murmurs and no lower extremity edema ABDOMEN:abdomen soft, non-tender and normal bowel sounds Musculoskeletal:no cyanosis of digits and no clubbing  NEURO: alert & oriented x 3 with fluent speech, no focal motor/sensory deficits  LABORATORY DATA:  I have reviewed the data as listed    Component Value Date/Time   NA 136 02/02/2024 1324   NA 139 11/26/2023 1459   K 4.2 02/02/2024 1324   CL 93 (L) 02/02/2024 1324   CO2 32 02/02/2024 1324   GLUCOSE 100 (H) 02/02/2024 1324   BUN 58 (H) 02/02/2024 1324   BUN 56 (H) 11/26/2023 1459   CREATININE 2.92 (H) 02/02/2024 1324   CREATININE 2.12 (H) 06/23/2023 1518   CALCIUM  9.2 02/02/2024 1324   PROT 7.4 01/26/2024 1139    PROT 7.2 06/09/2014 1317   ALBUMIN  3.6 01/26/2024 1139   ALBUMIN  3.8 06/09/2014 1317   AST 32 01/26/2024 1139   AST 15 10/01/2023 1505   ALT 16 01/26/2024 1139   ALT 6 10/01/2023 1505   ALT 23 06/09/2014 1317   ALKPHOS 57 01/26/2024 1139   ALKPHOS 71 06/09/2014 1317   BILITOT 0.9 01/26/2024 1139   BILITOT 0.6 10/01/2023 1505   GFRNONAA 15 (L) 02/02/2024 1324   GFRAA 39 (L) 12/23/2019 1458    Lab Results  Component Value Date   WBC 7.0 02/02/2024   NEUTROABS 5.5 02/02/2024   HGB 9.0 (L) 02/02/2024   HCT 30.0 (L) 02/02/2024   MCV 80.0 02/02/2024   PLT 394 02/02/2024    RADIOGRAPHIC STUDIES: DG Hand 2 View Right Result Date: 01/25/2024 CLINICAL DATA:  Right hand pain across the proximal metacarpals. EXAM: RIGHT HAND - 2 VIEW COMPARISON:  None Available. FINDINGS: No acute bony abnormality. Specifically, no fracture, subluxation, or dislocation. Soft tissues are intact. IMPRESSION: No acute bony abnormality. Electronically Signed   By: Franky Crease M.D.   On: 01/25/2024 17:03   CT FOOT LEFT WO CONTRAST Result Date: 01/13/2024 CLINICAL DATA:  Osteomyelitis EXAM: CT OF THE LEFT FOOT WITHOUT CONTRAST TECHNIQUE: Multidetector CT imaging of the left foot was performed according to the standard protocol. Multiplanar CT image reconstructions were also generated. RADIATION DOSE REDUCTION: This exam was performed according to the departmental dose-optimization program which includes automated exposure control, adjustment of the mA and/or kV according to patient size and/or use of iterative reconstruction technique. COMPARISON:  01/01/2024 radiographs FINDINGS: Bones/Joint/Cartilage Bony demineralization. No bony destructive findings characteristic of osteomyelitis. No fracture identified. Small plantar and Achilles calcaneal spurs. Ligaments Suboptimally assessed by CT. Muscles and Tendons Unremarkable Soft tissues Cutaneous thickening in lobularity along the distal calf, cannot exclude blistering.  This extends into the ankle and especially into the dorsum of the foot, with potential cutaneous ulceration dorsally overlying the cuboid on image 48 series 10. Confluent subcutaneous edema just plantar to the proximal plantar fascia, potentially from mild adventitial bursitis. IMPRESSION: 1. No bony destructive findings characteristic of osteomyelitis. 2. Cutaneous thickening and lobularity along the distal calf, ankle, and dorsum of the foot, with potential cutaneous ulceration dorsally overlying the cuboid. 3. Confluent subcutaneous edema just plantar to the proximal plantar  fascia, potentially from mild adventitial bursitis. 4. Bony demineralization. 5. Small plantar and Achilles calcaneal spurs. Electronically Signed   By: Ryan Salvage M.D.   On: 01/13/2024 11:21   CT TIBIA FIBULA LEFT WO CONTRAST Result Date: 01/13/2024 CLINICAL DATA:  Osteomyelitis EXAM: CT OF THE LOWER LEFT EXTREMITY WITHOUT CONTRAST TECHNIQUE: Multidetector CT imaging of the lower left extremity was performed according to the standard protocol. RADIATION DOSE REDUCTION: This exam was performed according to the departmental dose-optimization program which includes automated exposure control, adjustment of the mA and/or kV according to patient size and/or use of iterative reconstruction technique. COMPARISON:  Radiographs 01/09/2024 FINDINGS: Bones/Joint/Cartilage Bony demineralization. Mild chronic periostitis in the tibia and fibula, probably a manifestation of venous insufficiency. No bony destructive findings characteristic of osteomyelitis. Ligaments Suboptimally assessed by CT. Muscles and Tendons Subtle loss of fat planes along the distal tibialis anterior and extensor digitorum longus myotendinous junctions for example on image 161 series 8, potentially reflecting low-grade myositis underlying the large volar ulceration. No drainable abscess observed. Soft tissues Cutaneous irregularity especially in the lower calf and  especially anteriorly and tracking into the ankle. Cutaneous and subcutaneous ulceration at the distal metadiaphyseal level anteriorly on image 158 series 6. Cannot exclude cutaneous blistering along the lower calf. No appreciable drainable abscess. IMPRESSION: 1. Cutaneous and subcutaneous ulceration at the distal metadiaphyseal level anteriorly. Cannot exclude cutaneous blistering along the lower calf. No appreciable drainable abscess. 2. Subtle loss of fat planes along the distal tibialis anterior and extensor digitorum longus myotendinous junctions, potentially reflecting low-grade myositis underlying the large volar ulceration. 3. No bony destructive findings characteristic of osteomyelitis. 4. Mild chronic periostitis in the tibia and fibula, probably a manifestation of venous insufficiency. 5. Bony demineralization. Electronically Signed   By: Ryan Salvage M.D.   On: 01/13/2024 11:15   US  ARTERIAL ABI (SCREENING LOWER EXTREMITY) Result Date: 01/11/2024 CLINICAL DATA:  Peripheral arterial disease. Wounds on both legs. Left leg could not be evaluated due to wounds and patient pain. EXAM: NONINVASIVE PHYSIOLOGIC VASCULAR STUDY OF BILATERAL LOWER EXTREMITIES TECHNIQUE: Evaluation of both lower extremities were performed at rest, including calculation of ankle-brachial indices with single level Doppler, pressure and pulse volume recording. COMPARISON:  None Available. FINDINGS: Right ABI:  0.78 Left ABI:  Not evaluated Right Lower Extremity:  Irregular waveforms at the right ankle. Left Lower Extremity:  Not evaluated 0.5-0.79 Moderate PAD IMPRESSION: 1. Right ankle-brachial index is 0.78 suggesting moderate peripheral arterial disease. 2. Left leg could not be evaluated. Consider further evaluation with CTA or MRA if needed. Electronically Signed   By: Juliene Balder M.D.   On: 01/11/2024 08:11   US  Venous Img Lower Bilateral Result Date: 01/09/2024 EXAM: ULTRASOUND DUPLEX OF THE BILATERAL LOWER EXTREMITY  VEINS TECHNIQUE: Duplex ultrasound using B-mode/gray scaled imaging and Doppler spectral analysis and color flow was obtained of the deep venous structures of the bilateral lower extremity. COMPARISON: None. CLINICAL HISTORY: Cancer patient, worsening redness of bilateral lower extremities, swelling. FINDINGS: The visualized veins of the lower extremity are patent and free of echogenic thrombus. Limited views of the veins in the area of lower extremity wounds. The veins demonstrate good compressibility with normal color flow study and spectral analysis. IMPRESSION: 1. No evidence of DVT. Electronically signed by: Norman Gatlin MD 01/09/2024 06:58 PM EDT RP Workstation: [id number]   DG Tibia/Fibula Left Result Date: 01/09/2024 CLINICAL DATA:  Open wound, erythema and swelling EXAM: LEFT TIBIA AND FIBULA - 2 VIEW COMPARISON:  01/01/2024 FINDINGS: Frontal  and lateral views of the left tibia and fibula are obtained. The bones are diffusely osteopenic. There are no acute or destructive bony abnormalities. Specifically, no evidence of bony destruction or periosteal reaction to suggest osteomyelitis. Diffuse subcutaneous edema is again identified, with a large soft tissue defect within the anterior aspect of the distal left lower leg unchanged. No evidence of subcutaneous gas extending from the soft tissue defect. There are no radiopaque foreign bodies. IMPRESSION: 1. Large soft tissue defect within the distal left lower leg unchanged, with persistent diffuse subcutaneous edema. 2. No acute or destructive bony abnormalities. Electronically Signed   By: Ozell Daring M.D.   On: 01/09/2024 18:18    Addendum I have seen the patient, examined her. I agree with the assessment and and plan and have edited the notes.   Patient was hospitalized for lower extremity cellulitis earlier this month.  Chart reviewed.  I discussed the role of debridement and skin biopsy, and told patient that we can continue manage her anemia  without bevacizumab .  However patient has decided not to pursue any skin biopsy or debridement, she would like to continue our treatment for her anemia.  Lab reviewed, no need of blood transfusion, will proceed bevacizumab  today and continue every 4 weeks as maintenance therapy.  Will also continue Feraheme  if ferritin less than 50 (will be given at the [facility] yesterday next week and every 2 weeks), Retacrit  Injection every 2 weeks, and Sandostatin  injection every 4 weeks.  Will follow-up her closely.  All questions were answered.  Onita Mattock  02/02/2024

## 2024-02-01 NOTE — Progress Notes (Deleted)
 ADVANCED HF CLINIC NOTE      PCP:  [name], MD  Cardiologist:  Elspeth Sage, MD  Primary HF: Dr. Rolan   Chief Complaint: shortness of breath    History of Present Illness:  [name] is an 89 y.o. with history of permanent atrial fibrillation, chronic diastolic CHF, CKD stage 3, smoking/prior COPD, renal cell CA s/p right nephrectomy. She has a prior history of HFrEF with nonischemic cardiomyopathy and had a Medtronic ICD placed. However, subsequently her EF has recovered.    06/20, she had left hip ORIF after mechanical fall.  Echo in 06/20 showed EF 60-65%, mild RV dilation with normal systolic function, PASP 93 mmHg.  After she got home from the hospital stay post-ORIF, she noted marked peripheral edema. She was short of breath walking short distances.  +orthopnea. She came back to the hospital 07/20 and was admitted with volume overload. She was diuresed aggressively and lost about 22 lbs. RHC showed pulmonary venous hypertension. PCWP tracing had prominent v-waves in absence of significant MR, suggesting significant diastolic dysfunction. Noted to have significant Fe deficiency anemia, but FOBT was negative during 7/20 hospitalization.   Admitted 11/20 with symptomatic anemia, GI workup concerning for ischemic bowel. 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 Melvenia with VVS and conservative management recommended. Capsule endoscopy showed no definite source of bleeding. She was volume overloaded due to significant RV dysfunction and diuresed, she also developed AKI on CKD stage 3.      10/22, she had bilateral CIA stents placed. ABIs in 11/22 showed patent stents. She had a skin cancer removed from her left leg. 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   Echo 2/23 EF 65-70% with mild LVH, D-shaped septum with moderate RV enlargement, and mildly decreased RV function, PASP 92, moderate-severe TR, dilated IVC,  moderate aortic stenosis. Echo in 10/23 showed EF 60-65%, D-shaped septum, mild RV enlargement with normal RV systolic function, PASP 90, moderate-severe TR, mild AS, IVC dilated.   Patient was found to have a neuroendocrine tumor of the pancreatic head in the fall of 2023.  She has been treated with sandostatin  monthly.  She also had further GI bleeding from small bowel AVMs in fall 2023. She also had COVID-19 in 10/23 and has been on home oxygen  2L since that time.   Follow up with Dr. Sage, long discussion about EOL. Given DNR form and high-voltage therapies inactivated on ICD.  Admit 07/24 with acute on chronic blood loss anemia 2/2 GI bleed. Transfused and given IV iron . Had small bowel enteroscopy which showed 2 nonbleeding angiodysplastic lesions in duodenum and 3 nonbleeding angioplastic lesions in jejunum treated with APC.  Admitted early 09/24 with acute on chronic anemia suspected 2/2 chronic GI blood loss. Hgb down to 6.8. Received 2 u RBCs. GI did not recommend any endoscopic procedures d/t advanced age and comorbidities. Course further c/b acute on chronic CHF. She was diuresed with IV lasix  and metolazone . Echo during admit with LVEF 65-70%, D-shaped septum, mildly reduced RV, RVSP 103 mmHg, biatrial enlargement, severe TR, moderate mitral stenosis with mean gradient of 6 mmHg, dilated IVC.  She was seen back in the [facility] on 01/30/23 for post hospital f/u and noted recurrent melena, SOB and fatigued w/ minimal exertion. Also noted to have recurrent volume overload w/ marked return of LEE. Hgb was down to 6.6. She was sent back to the ED and readmitted again for a/c  CHF and GIB, treated w/ blood transfusions, IV Fe and diuretics. Discharged on 02/06/23 on torsemide  100 mg bid.   S/p 2u RBCs 04/26/23 for symptomatic anemia. Hgb 6.8.   Admitted 05/27/23 due to shortness of breath/ leg swelling. Initially given IV lasix   with removal of ~ 4Land transitioned to oral torsemide  at 100mg  BID at  discharge. Metolazone  decreased to weekly. Treated with antibiotics for leg cellulitis. Given 1 unit PRBC's due to hemoglobin trending down to 7.1. Wound care consulted due to weeping area on left shin. Palliative care also consulted.   She was admitted in 2/25 with lower leg cellulitis.  She was treated with IV abx. She was admitted again in 4/25 with lower leg cellulitis.  She was admitted in 5/25 with GI bleeding/anemia.   Echo in 5/25 showed EF 60-65%, mild LVH, d-shaped interventricular septum, severe RV enlargement with moderately decreased RV systolic function, PASP 96 mmHg, severe TR, mild MR with moderate [name] (mean gradient 5 mmHg), moderate AS, IVC dilated.   She returns today, with her daughter, for followup of CHF with a chief complaint of shortness of breath. Has associated fatigue, occasional chest pain, palpitations, dizziness, decreased appetite, neuropathy in legs. Chronic difficulty sleeping due to pain in left leg. Follows with the wound center every 2 weeks and she wraps her leg herself. Notes some improvement in wound and swelling.   Labs (10/23): magnesium  1.7 Labs (3/24): TSH 2.93 Labs (4/24): K 3.9, creatinine 1.69, ALT 9, hgb 10, Plt 175 Labs (8/24): K 4.5, creatinine, 1.6 Labs (10/24): K 4.1, creatinine 1.55, Hgb 7.6 Labs (11/24): K 4.6, SCr 1.52 Labs (11/24): K 4.6, SCr 1.82 => 1.78, TSat 3, ferritin 16 Labs (12/24): K 3.9, creatinine 1.83 Labs (01/25): K 4.2, creatinine 1.89 Labs (2/25): hgb 8.5, BNP 517, K 3.5, creatinine 1.86 Labs (3/25): K 4, creatinine 2.13, hgb 7.8 Labs (5/25): K 3.5, creatinine 1.71 => 2.39  PMH: 1. COPD 2. Renal cell carcinoma: S/p right kidney resection.  3. CKD stage 3 4. Fe deficiency anemia 5. Atrial fibrillation: Permanent. She has failed amiodarone , Tikosyn , and flecainide  as well as multiple cardioversions. 6. Chronic diastolic CHF: She has history of prior HFrEF with nonischemic cardiomyopathy.  She has a Medtronic ICD.   - LHC in  2006 showed nonobstructive CAD.  - Echo (6/20): EF 60-65%, mildly dilated RV with PASP 93 mmHg, mild-moderate TR.  - RHC (7/20): mean RA 11, PA 64/18 mean 39, mean PCWP 24 with v waves to 41, CI 4.21, PVR 1.9 WU.  - Cardiomems placement - RHC (11/20): mean RA 13, PA 63/23 mean 25 with v waves to 47 (MR only mild by echo), mean PCWP 25, CI 3.18, PVR 2.6 WU - Echo (11/21): EF 65-70%, moderate LVH, D-shaped septum with mildly decreased RV systolic function, RVSP 85.9 mmHg, severe RAE, severe TR, mod-sev AS. - Echo (2/23): EF 65-70% with mild LVH, D-shaped septum with moderate RV enlargement, and mildly decreased RV function, PASP 92, moderate-severe TR, dilated IVC, moderate aortic stenosis.  - Echo (10/23): EF 60-65%, D-shaped septum, mild RV enlargement with normal RV systolic function, PASP 90, moderate-severe TR, mild AS, IVC dilated.  - Echo (9/24): LVEF 65-70%, D-shaped septum, mildly reduced RV, RVSP 103 mmHg, biatrial enlargement, severe TR, moderate mitral stenosis with mean gradient of 6 mmHg, dilated IVC - Echo (01/25): EF 65-70%, mild LVH, RV moderately reduced, severely elevated PA pressure 83.9 mmHg, mild MR, moderate [name] with mean gradient of 6.0 mmHg, IVC dialted - Echo (  5/25): EF 60-65%, mild LVH, d-shaped interventricular septum, severe RV enlargement with moderately decreased RV systolic function, PASP 96 mmHg, severe TR, mild MR with moderate [name] (mean gradient 5 mmHg), moderate AS, IVC dilated.  7. Hyperlipidemia 8. Carotid stenosis: Carotid dopplers (3/19) with 40-59% LICA stenosis.   9. Possible ischemic bowel in 11/20 10. 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 Conservative management per Dr. Melvenia.  - 10/22 bilateral CIA stents.  - 11/22 ABIs: 1.07 left, 0.83 right with patent CIA stents.  11. 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  12. Squamous cell skin cancer.  13. Neuroendocrine tumor of pancreatic head:  Treating with sandostatin .  14. COVID 19 in 10/23 15. Tricuspid regurgitation: Severe on 5/25 echo.  16. Aortic stenosis: Moderate on 5/25 echo.  17. Mitral stenosis: Moderate (mean gradient 5 mmHg) on 5/25 echo.     Current Outpatient Medications on File Prior to Visit  Medication Sig Dispense Refill   acetaminophen  (TYLENOL ) 500 MG tablet Take 1,000 mg by mouth every 8 (eight) hours.     Ascorbic Acid  (VITAMIN C  PO) Take 1 tablet by mouth daily.     Cholecalciferol  (VITAMIN D -3 PO) Take 2 capsules by mouth daily.     CRANBERRY PO Take 1 tablet by mouth daily.     Cyanocobalamin  (B-12 PO) Take 1 tablet by mouth daily.     ferrous gluconate  (FERGON) 324 MG tablet Take 1 tablet (324 mg total) by mouth daily with breakfast. 30 tablet 3   fluticasone  (FLONASE ) 50 MCG/ACT nasal spray Place 2 sprays into both nostrils daily as needed for allergies or rhinitis. 16 g 0   leptospermum manuka honey (MEDIHONEY) PSTE paste Apply 1 Application topically daily.     [Paused] metolazone  (ZAROXOLYN ) 2.5 MG tablet Take 1 tablet (2.5 mg total) by mouth 3 (three) times a week. Every Tues., Thurs and Sat. with 40 meq of potassium 90 tablet 3   Multiple Vitamins-Minerals (HAIR SKIN NAILS PO) Take 1 capsule by mouth daily.     polyethylene glycol (MIRALAX  / GLYCOLAX ) 17 g packet Take 17 g by mouth daily.     Potassium Chloride  ER 20 MEQ TBCR Take 1 tablet (20 mEq total) by mouth daily. 30 tablet 2   rosuvastatin  (CRESTOR ) 10 MG tablet Take 1 tablet (10 mg total) by mouth daily. (Patient not taking: Reported on 12/01/2023) 30 tablet 0   sodium phosphate  (FLEET) ENEM Place 133 mLs (1 enema total) rectally daily as needed for severe constipation.     torsemide  (DEMADEX ) 20 MG tablet Take 5 tablets (100 mg total) by mouth daily.     traMADol  (ULTRAM ) 50 MG tablet Take 1 tablet (50 mg total) by mouth every 12 (twelve) hours as needed. 30 tablet 0   No current facility-administered medications on file prior to visit.      Allergies:   Dilaudid  [hydromorphone ], Fentanyl , Codeine, Morphine  and related, Amoxicillin , Hydrocodone , Oxycodone , Tramadol , Whole blood, and Sulfonamide derivatives    Social History:  The patient  reports that she quit smoking about 21 years ago. Her smoking use included cigarettes. She has a 20.00 pack-year smoking history. She has never used smokeless tobacco. She reports that she does not drink alcohol and does not use drugs.    Family History:  The patient's family history includes Breast cancer in her cousin and another family member; Breast cancer (age of onset: 70) in her maternal aunt; Early death in her father; Heart  failure in her mother.    ROS:  Please see the history of present illness.   All other systems are personally reviewed and negative.    There were no vitals filed for this visit.  Wt Readings from Last 3 Encounters:  01/26/24 117 lb 1 oz (53.1 kg)  01/07/24 120 lb 3.2 oz (54.5 kg)  12/01/23 131 lb (59.4 kg)   Lab Results  Component Value Date   CREATININE 2.12 (H) 01/26/2024   CREATININE 2.35 (H) 01/25/2024   CREATININE 2.53 (H) 01/25/2024    PHYSICAL EXAM:  General: Frail appearing female in wheelchair.  Cor: No JVD. Regular rhythm, rate. 3/6 systolic murmur Lungs: clear Abdomen: soft, nontender, nondistended. Extremities: 2+ pitting edema bilateral lower legs. Drying wounds on left shin/ left foot . Slight redness to left shin Neuro:. Affect pleasant   ASSESSMENT AND PLAN:  1. Chronic diastolic CHF: With prominent RV failure. She had remote nonischemic cardiomyopathy with recovery of EF, has Medtronic ICD. Atrial fibrillation likely contributes, but this appears to be permanent now as she has failed multiple anti-arrhythmics and is reasonably rate-controlled. RHC 11/20 showed elevated filling pressures and pulmonary venous hypertension. Prominent v-waves on PCWP tracing likely due to stiff ventricle/diastolic dysfunction as she had only mild MR on  echo. She is RV pacing at a high percentage, but LV EF has remained normal. Most recent echo in 5/25 showed EF 60-65%, mild LVH, d-shaped interventricular septum, severe RV enlargement with moderately decreased RV systolic function, PASP 96 mmHg, severe TR, mild MR with moderate [name] (mean gradient 5 mmHg), moderate AS, IVC dilated.  - NYHA class 3 to 3b, worse when she is anemic.  - She does not look markedly volume overloaded today, Severe TR but no JVD.  Lower extremity edema is chronic.   - Continue metolazone  2.5 mg three times weekly.  - Continue torsemide  100 mg bid and KCl 20 daily (40 on metolazone  days).   - Off SGLT2i after UTIs 2. Hypertension:  - BP 132/45 - BMET 10/01/23 reviewed: sodium 138, potassium 3.5, creatinine 2.39 & GFR 19. Recheck BMET today 3. Pulmonary hypertension:  Based on RHC 11/20, she has primarily pulmonary venous hypertension, not candidate for pulmonary vasodilators.  4. CKD: Stage 4. Off SGLT2i.  Most recent creatinine 2.39. BMET today 5. Chronic anemia/GI bleeding: Due to small bowel AVMs. She has had transfusions and IV Fe, she was most recently admitted for this in 5/25.  - She is now off anticoagulation.  6. Atrial fibrillation: Permanent. Rate control is reasonable off nodal blockers. She has failed amiodarone , Tikosyn , and flecainide  as well as multiple cardioversions.  - Off Eliquis  with recurrent GI bleeding and transfusions.  - Not Watchman candidate due to frailty and poorly compensated CHF.  7. Neuroendocrine tumor of pancreatic head - Ongoing treatment per oncology. Last seen 07/25 8. Valvular disease- - Tricuspid regurgitation: Severe on 5/25 echo. Mitral stenosis: Moderate on 5/25 echo. Follow, not surgical candidate.  - Aortic stenosis: Moderate on 5/25 echo.  9 Leg wound:: - Saw wound clinic yesterday - slight redness left shin but better than previous visits   Return in 2 months, sooner if needed.    Ellouise DELENA Class, 02/01/24

## 2024-02-02 ENCOUNTER — Ambulatory Visit: Payer: Self-pay

## 2024-02-02 ENCOUNTER — Encounter: Admitting: Family

## 2024-02-02 ENCOUNTER — Encounter: Payer: Self-pay | Admitting: Hematology

## 2024-02-02 ENCOUNTER — Inpatient Hospital Stay

## 2024-02-02 ENCOUNTER — Inpatient Hospital Stay (HOSPITAL_BASED_OUTPATIENT_CLINIC_OR_DEPARTMENT_OTHER): Admitting: Nurse Practitioner

## 2024-02-02 VITALS — BP 134/68 | HR 68 | Temp 98.1°F | Wt 114.0 lb

## 2024-02-02 VITALS — BP 134/68 | HR 62 | Temp 98.1°F | Resp 16 | Wt 114.0 lb

## 2024-02-02 DIAGNOSIS — D649 Anemia, unspecified: Secondary | ICD-10-CM

## 2024-02-02 DIAGNOSIS — D3A8 Other benign neuroendocrine tumors: Secondary | ICD-10-CM

## 2024-02-02 DIAGNOSIS — K5520 Angiodysplasia of colon without hemorrhage: Secondary | ICD-10-CM

## 2024-02-02 DIAGNOSIS — D500 Iron deficiency anemia secondary to blood loss (chronic): Secondary | ICD-10-CM

## 2024-02-02 DIAGNOSIS — Z5112 Encounter for antineoplastic immunotherapy: Secondary | ICD-10-CM | POA: Diagnosis not present

## 2024-02-02 LAB — CBC WITH DIFFERENTIAL (CANCER CENTER ONLY)
Abs Immature Granulocytes: 0.02 K/uL (ref 0.00–0.07)
Basophils Absolute: 0 K/uL (ref 0.0–0.1)
Basophils Relative: 1 %
Eosinophils Absolute: 0 K/uL (ref 0.0–0.5)
Eosinophils Relative: 1 %
HCT: 30 % — ABNORMAL LOW (ref 36.0–46.0)
Hemoglobin: 9 g/dL — ABNORMAL LOW (ref 12.0–15.0)
Immature Granulocytes: 0 %
Lymphocytes Relative: 9 %
Lymphs Abs: 0.6 K/uL — ABNORMAL LOW (ref 0.7–4.0)
MCH: 24 pg — ABNORMAL LOW (ref 26.0–34.0)
MCHC: 30 g/dL (ref 30.0–36.0)
MCV: 80 fL (ref 80.0–100.0)
Monocytes Absolute: 0.8 K/uL (ref 0.1–1.0)
Monocytes Relative: 11 %
Neutro Abs: 5.5 K/uL (ref 1.7–7.7)
Neutrophils Relative %: 78 %
Platelet Count: 394 K/uL (ref 150–400)
RBC: 3.75 MIL/uL — ABNORMAL LOW (ref 3.87–5.11)
RDW: 19.9 % — ABNORMAL HIGH (ref 11.5–15.5)
WBC Count: 7 K/uL (ref 4.0–10.5)
nRBC: 0 % (ref 0.0–0.2)

## 2024-02-02 LAB — BASIC METABOLIC PANEL - CANCER CENTER ONLY
Anion gap: 11 (ref 5–15)
BUN: 58 mg/dL — ABNORMAL HIGH (ref 8–23)
CO2: 32 mmol/L (ref 22–32)
Calcium: 9.2 mg/dL (ref 8.9–10.3)
Chloride: 93 mmol/L — ABNORMAL LOW (ref 98–111)
Creatinine: 2.92 mg/dL — ABNORMAL HIGH (ref 0.44–1.00)
GFR, Estimated: 15 mL/min — ABNORMAL LOW (ref >60)
Glucose, Bld: 100 mg/dL — ABNORMAL HIGH (ref 70–99)
Potassium: 4.2 mmol/L (ref 3.5–5.1)
Sodium: 136 mmol/L (ref 135–145)

## 2024-02-02 LAB — FERRITIN: Ferritin: 622 ng/mL — ABNORMAL HIGH (ref 11–307)

## 2024-02-02 LAB — SAMPLE TO BLOOD BANK

## 2024-02-02 LAB — TOTAL PROTEIN, URINE DIPSTICK: Protein, ur: 30 mg/dL — AB

## 2024-02-02 MED ORDER — OCTREOTIDE ACETATE 20 MG IM KIT
20.0000 mg | PACK | Freq: Once | INTRAMUSCULAR | Status: AC
  Administered 2024-02-02: 20 mg via INTRAMUSCULAR
  Filled 2024-02-02: qty 1

## 2024-02-02 MED ORDER — SODIUM CHLORIDE 0.9 % IV SOLN: Freq: Once | INTRAVENOUS | Status: AC

## 2024-02-02 MED ORDER — ONDANSETRON HCL 8 MG PO TABS
4.0000 mg | ORAL_TABLET | Freq: Once | ORAL | Status: AC
  Administered 2024-02-02: 4 mg via ORAL
  Filled 2024-02-02: qty 1

## 2024-02-02 MED ORDER — SODIUM CHLORIDE 0.9 % IV SOLN
250.0000 mg | Freq: Once | INTRAVENOUS | Status: AC
  Administered 2024-02-02: 250 mg via INTRAVENOUS
  Filled 2024-02-02: qty 10

## 2024-02-02 MED ORDER — EPOETIN ALFA-EPBX 40000 UNIT/ML IJ SOLN
40000.0000 [IU] | Freq: Once | INTRAMUSCULAR | Status: AC
  Administered 2024-02-02: 40000 [IU] via SUBCUTANEOUS
  Filled 2024-02-02: qty 1

## 2024-02-02 NOTE — Telephone Encounter (Signed)
 Called daughter on dpr not able to leave voicemail.

## 2024-02-02 NOTE — Assessment & Plan Note (Addendum)
 Patient currently receiving Aranesp injections every 2 weeks.  Blood counts improving.  Has not needed blood transfusion since 06/23/2023. -07/28/2023 -Hgb 8.2.  Blood transfusion not needed this week.  Recheck labs in 1 week.  Add ferritin. -Continue Aranesp injections every 2 weeks.  Blood transfusions as needed.  2 units PRBCs for hemoglobin <7.0.  1 unit PRBCs for Hgb between 7 and 8. - 02/02/2024 -Hgb 9.0 today.  No requirement for blood transfusion.  Administer Retacrit  today.  Arrange for IV iron  as indicated based on results of ferritin when available.

## 2024-02-02 NOTE — Telephone Encounter (Signed)
 FYI Only or Action Required?: Action required by provider: Granddaughter is requesting that pcp place orders for arm to be fully evaluated by rehab.  Please advise granddaughter Bari.  Patient was last seen in primary care on 12/01/2023 by [name], MD.  Called Nurse Triage reporting Advice Only.  Symptoms began a week ago.  Interventions attempted: Other: in rehab.  Symptoms are: unchanged.  Triage Disposition: No disposition on file.  Patient/caregiver understands and will follow disposition?: yes                    Copied from CRM [id number]. Topic: Clinical - Red Word Triage >> Feb 02, 2024 10:41 AM Dedra NOVAK wrote: [facility] that prompted transfer to Nurse Triage: Pt granddaughter Bari is calling on behalf of pt. Pt is currently in nursing rehab. Pt R arm is swollen and she is having difficulty gripping. She would like an order for the nursing home doctor to examine it. Warm transfer to NT. Answer Assessment - Initial Assessment Questions Call from pt's granddaughter. She states that arm still has not been cared for. Per note from 01/27/2024 PCP will need to address arm problem. He needs to place an order to arm to be examined by provider at re-hab. And or any testing needed.   Family is frustrated that this arm issue has not been addressed.    1. REASON FOR CALL or QUESTION: What is your reason for calling today? or How can I best     Call from Pt's granddaughter. Pt's arm is swollen and she cannot grip with it. Note from encounter of 01/27/2024 regarding pt's arm copied here;    [name] stated that the pt has a swollen arm which she may have gotten from her hospital stay. [name] stated that a provider looked at the arm but didn't find anything and advised her that the arm may be inflamed and to reach out to the pcp. [name] stated that the pt is in a lot of pain and is experiencing weakness.       2. CALLER: Document the source of call. (e.g.,  laboratory staff, caregiver or patient).     Granddaughter.  Protocols used: PCP Call - No Triage-A-AH

## 2024-02-02 NOTE — Patient Instructions (Addendum)
 [facility] - A DEPT OF [facility]  Discharge Instructions: Thank you for choosing [facility] to provide your oncology and hematology care.   If you have a lab appointment with the Cancer Center, please go directly to the Cancer Center and check in at the registration area.   Wear comfortable clothing and clothing appropriate for easy access to any Portacath or PICC line.   We strive to give you quality time with your provider. You may need to reschedule your appointment if you arrive late (15 or more minutes).  Arriving late affects you and other patients whose appointments are after yours.  Also, if you miss three or more appointments without notifying the office, you may be dismissed from the clinic at the provider's discretion.      For prescription refill requests, have your pharmacy contact our office and allow 72 hours for refills to be completed.    Today you received the following chemotherapy and/or immunotherapy agents Bevacizumab , Sandostatin , Retacrit       To help prevent nausea and vomiting after your treatment, we encourage you to take your nausea medication as directed.  BELOW ARE SYMPTOMS THAT SHOULD BE REPORTED IMMEDIATELY: *FEVER GREATER THAN 100.4 F (38 C) OR HIGHER *CHILLS OR SWEATING *NAUSEA AND VOMITING THAT IS NOT CONTROLLED WITH YOUR NAUSEA MEDICATION *UNUSUAL SHORTNESS OF BREATH *UNUSUAL BRUISING OR BLEEDING *URINARY PROBLEMS (pain or burning when urinating, or frequent urination) *BOWEL PROBLEMS (unusual diarrhea, constipation, pain near the anus) TENDERNESS IN MOUTH AND THROAT WITH OR WITHOUT PRESENCE OF ULCERS (sore throat, sores in mouth, or a toothache) UNUSUAL RASH, SWELLING OR PAIN  UNUSUAL VAGINAL DISCHARGE OR ITCHING   Items with * indicate a potential emergency and should be followed up as soon as possible or go to the Emergency Department if any problems should occur.  Please show the CHEMOTHERAPY  ALERT CARD or IMMUNOTHERAPY ALERT CARD at check-in to the Emergency Department and triage nurse.  Should you have questions after your visit or need to cancel or reschedule your appointment, please contact [facility] - A DEPT OF [facility]  Dept: [phone]  and follow the prompts.  Office hours are 8:00 a.m. to 4:30 p.m. Monday - Friday. Please note that voicemails left after 4:00 p.m. may not be returned until the following business day.  We are closed weekends and major holidays. You have access to a nurse at all times for urgent questions. Please call the main number to the clinic Dept: [phone] and follow the prompts.   For any non-urgent questions, you may also contact your provider using MyChart. We now offer e-Visits for anyone 8 and older to request care online for non-urgent symptoms. For details visit [email or web address].   Also download the MyChart app! Go to the app store, search MyChart, open the app, select [facility], and log in with your MyChart username and password.

## 2024-02-02 NOTE — Telephone Encounter (Addendum)
 Pt is currently in rehab.  Per my last note, recommend facility provider to direct medical treatment while there, as I cannot order imaging or wound care when patient is in a facility.   Spoke with daughter [name] today Pt went to onc today - transport through rehab facility. She is at [facility].   R hand pain and swelling into part of arm that started on day of discharge from hospital - she will get a hold of rehab provider and request an evaluation for her mom.   Discussed OV follow up once she's discharged from rehab.

## 2024-02-02 NOTE — Progress Notes (Addendum)
 Decrease bevacizumab  dose to 250mg  based on current wt=51.7kg and update wt basis in tx plan to current wt per Dr. Lanny.  Itza Maniaci, PharmD, MBA

## 2024-02-03 ENCOUNTER — Telehealth: Payer: Self-pay | Admitting: Nurse Practitioner

## 2024-02-03 ENCOUNTER — Ambulatory Visit: Admitting: Family Medicine

## 2024-02-03 DIAGNOSIS — J449 Chronic obstructive pulmonary disease, unspecified: Secondary | ICD-10-CM | POA: Diagnosis not present

## 2024-02-03 DIAGNOSIS — E785 Hyperlipidemia, unspecified: Secondary | ICD-10-CM | POA: Diagnosis not present

## 2024-02-03 DIAGNOSIS — S81802A Unspecified open wound, left lower leg, initial encounter: Secondary | ICD-10-CM | POA: Diagnosis not present

## 2024-02-03 DIAGNOSIS — I5033 Acute on chronic diastolic (congestive) heart failure: Secondary | ICD-10-CM | POA: Diagnosis not present

## 2024-02-03 DIAGNOSIS — I429 Cardiomyopathy, unspecified: Secondary | ICD-10-CM | POA: Diagnosis not present

## 2024-02-03 DIAGNOSIS — I10 Essential (primary) hypertension: Secondary | ICD-10-CM | POA: Diagnosis not present

## 2024-02-03 DIAGNOSIS — I739 Peripheral vascular disease, unspecified: Secondary | ICD-10-CM | POA: Diagnosis not present

## 2024-02-03 DIAGNOSIS — C7A8 Other malignant neuroendocrine tumors: Secondary | ICD-10-CM | POA: Diagnosis not present

## 2024-02-03 DIAGNOSIS — I272 Pulmonary hypertension, unspecified: Secondary | ICD-10-CM | POA: Diagnosis not present

## 2024-02-03 DIAGNOSIS — I480 Paroxysmal atrial fibrillation: Secondary | ICD-10-CM | POA: Diagnosis not present

## 2024-02-03 DIAGNOSIS — N1832 Chronic kidney disease, stage 3b: Secondary | ICD-10-CM | POA: Diagnosis not present

## 2024-02-03 DIAGNOSIS — I70248 Atherosclerosis of native arteries of left leg with ulceration of other part of lower left leg: Secondary | ICD-10-CM | POA: Diagnosis not present

## 2024-02-03 DIAGNOSIS — I70245 Atherosclerosis of native arteries of left leg with ulceration of other part of foot: Secondary | ICD-10-CM | POA: Diagnosis not present

## 2024-02-03 DIAGNOSIS — Z905 Acquired absence of kidney: Secondary | ICD-10-CM | POA: Diagnosis not present

## 2024-02-03 NOTE — Telephone Encounter (Signed)
 Lvm asking pt/pt's daughter, [name] (on dpr), to call back. Need to relay Dr Talmadge message.

## 2024-02-03 NOTE — Telephone Encounter (Signed)
 I attempted to inform [name] of her scheduled appointments but her mail box is full.

## 2024-02-04 DIAGNOSIS — I5089 Other heart failure: Secondary | ICD-10-CM | POA: Diagnosis not present

## 2024-02-04 DIAGNOSIS — Z515 Encounter for palliative care: Secondary | ICD-10-CM | POA: Diagnosis not present

## 2024-02-04 DIAGNOSIS — R54 Age-related physical debility: Secondary | ICD-10-CM | POA: Diagnosis not present

## 2024-02-04 DIAGNOSIS — E782 Mixed hyperlipidemia: Secondary | ICD-10-CM | POA: Diagnosis not present

## 2024-02-04 DIAGNOSIS — J302 Other seasonal allergic rhinitis: Secondary | ICD-10-CM | POA: Diagnosis not present

## 2024-02-04 NOTE — Telephone Encounter (Signed)
 Lvm asking pt/pt's daughter, [name] (on dpr), to call back. Need to relay Dr Talmadge message.

## 2024-02-05 NOTE — Telephone Encounter (Signed)
 Attempted to contact pt's daughter, [name] (on dpr). No answer, vm box not set up.

## 2024-02-05 NOTE — Telephone Encounter (Signed)
 Called patient several times not able to reach do you want us  to continue to call or mail letter /send my chart to reach out to our office?

## 2024-02-06 DIAGNOSIS — Z515 Encounter for palliative care: Secondary | ICD-10-CM | POA: Diagnosis not present

## 2024-02-06 DIAGNOSIS — I5089 Other heart failure: Secondary | ICD-10-CM | POA: Diagnosis not present

## 2024-02-06 DIAGNOSIS — E782 Mixed hyperlipidemia: Secondary | ICD-10-CM | POA: Diagnosis not present

## 2024-02-06 DIAGNOSIS — J302 Other seasonal allergic rhinitis: Secondary | ICD-10-CM | POA: Diagnosis not present

## 2024-02-06 DIAGNOSIS — R54 Age-related physical debility: Secondary | ICD-10-CM | POA: Diagnosis not present

## 2024-02-06 NOTE — Telephone Encounter (Signed)
 Attempted to contact pt's daughter, [name] (on dpr). No answer, vm box not set up.

## 2024-02-06 NOTE — Telephone Encounter (Signed)
 See other 02/02/2024 phone note.  I spoke with daughter [name] on 9/29 PM.  No need to continue reaching out to daughter. Thanks.

## 2024-02-09 ENCOUNTER — Telehealth: Payer: Self-pay | Admitting: Family

## 2024-02-09 NOTE — Telephone Encounter (Signed)
 Called to confirm/remind patient of [facility] on 02/10/[facility].   Appointment:   [] Confirmed  [] Left mess   [] No answer/No voice mail  [x] VM Full/unable to leave message  [] Phone not in service  Patient reminded to bring all medications and/or complete list.  Confirmed patient has transportation. Gave directions, instructed to utilize valet parking.

## 2024-02-10 ENCOUNTER — Encounter: Admitting: Family

## 2024-02-10 ENCOUNTER — Telehealth: Payer: Self-pay | Admitting: Family

## 2024-02-10 DIAGNOSIS — I70245 Atherosclerosis of native arteries of left leg with ulceration of other part of foot: Secondary | ICD-10-CM | POA: Diagnosis not present

## 2024-02-10 DIAGNOSIS — I70248 Atherosclerosis of native arteries of left leg with ulceration of other part of lower left leg: Secondary | ICD-10-CM | POA: Diagnosis not present

## 2024-02-10 NOTE — Progress Notes (Deleted)
 ADVANCED HF CLINIC NOTE      PCP:  [name], MD  Cardiologist:  Elspeth Sage, MD  Primary HF: Dr. Rolan   Chief Complaint:     History of Present Illness:  Ms [name] is an 89 y.o. with history of permanent atrial fibrillation, chronic diastolic CHF, CKD stage 3, smoking/prior COPD, renal cell CA s/p right nephrectomy. She has a prior history of HFrEF with nonischemic cardiomyopathy and had a Medtronic ICD placed. However, subsequently her EF has recovered.    06/20, she had left hip ORIF after mechanical fall.  Echo in 06/20 showed EF 60-65%, mild RV dilation with normal systolic function, PASP 93 mmHg.  After she got home from the hospital stay post-ORIF, she noted marked peripheral edema. She was short of breath walking short distances.  +orthopnea. She came back to the hospital 07/20 and was admitted with volume overload. She was diuresed aggressively and lost about 22 lbs. RHC showed pulmonary venous hypertension. PCWP tracing had prominent v-waves in absence of significant MR, suggesting significant diastolic dysfunction. Noted to have significant Fe deficiency anemia, but FOBT was negative during 7/20 hospitalization.   Admitted 11/20 with symptomatic anemia, GI workup concerning for ischemic bowel. 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 Melvenia with VVS and conservative management recommended. Capsule endoscopy showed no definite source of bleeding. She was volume overloaded due to significant RV dysfunction and diuresed, she also developed AKI on CKD stage 3.      10/22, she had bilateral CIA stents placed. ABIs in 11/22 showed patent stents. She had a skin cancer removed from her left leg. 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   Echo 2/23 EF 65-70% with mild LVH, D-shaped septum with moderate RV enlargement, and mildly decreased RV function, PASP 92, moderate-severe TR, dilated IVC, moderate aortic  stenosis. Echo in 10/23 showed EF 60-65%, D-shaped septum, mild RV enlargement with normal RV systolic function, PASP 90, moderate-severe TR, mild AS, IVC dilated.   Patient was found to have a neuroendocrine tumor of the pancreatic head in the fall of 2023.  She has been treated with sandostatin  monthly.  She also had further GI bleeding from small bowel AVMs in fall 2023. She also had COVID-19 in 10/23 and has been on home oxygen  2L since that time.   Follow up with Dr. Sage, long discussion about EOL. Given DNR form and high-voltage therapies inactivated on ICD.  Admit 07/24 with acute on chronic blood loss anemia 2/2 GI bleed. Transfused and given IV iron . Had small bowel enteroscopy which showed 2 nonbleeding angiodysplastic lesions in duodenum and 3 nonbleeding angioplastic lesions in jejunum treated with APC.  Admitted early 09/24 with acute on chronic anemia suspected 2/2 chronic GI blood loss. Hgb down to 6.8. Received 2 u RBCs. GI did not recommend any endoscopic procedures d/t advanced age and comorbidities. Course further c/b acute on chronic CHF. She was diuresed with IV lasix  and metolazone . Echo during admit with LVEF 65-70%, D-shaped septum, mildly reduced RV, RVSP 103 mmHg, biatrial enlargement, severe TR, moderate mitral stenosis with mean gradient of 6 mmHg, dilated IVC.  She was seen back in the [facility] on 01/30/23 for post hospital f/u and noted recurrent melena, SOB and fatigued w/ minimal exertion. Also noted to have recurrent volume overload w/ marked return of LEE. Hgb was down to 6.6. She was sent back to the ED and readmitted again for a/c CHF and  GIB, treated w/ blood transfusions, IV Fe and diuretics. Discharged on 02/06/23 on torsemide  100 mg bid.   S/p 2u RBCs 04/26/23 for symptomatic anemia. Hgb 6.8.   Admitted 05/27/23 due to shortness of breath/ leg swelling. Initially given IV lasix   with removal of ~ 4Land transitioned to oral torsemide  at 100mg  BID at discharge.  Metolazone  decreased to weekly. Treated with antibiotics for leg cellulitis. Given 1 unit PRBC's due to hemoglobin trending down to 7.1. Wound care consulted due to weeping area on left shin. Palliative care also consulted.   She was admitted in 2/25 with lower leg cellulitis.  She was treated with IV abx. She was admitted again in 4/25 with lower leg cellulitis.  She was admitted in 5/25 with GI bleeding/anemia.   Echo in 5/25 showed EF 60-65%, mild LVH, d-shaped interventricular septum, severe RV enlargement with moderately decreased RV systolic function, PASP 96 mmHg, severe TR, mild MR with moderate MS (mean gradient 5 mmHg), moderate AS, IVC dilated.   Admitted 01/09/24 with 2 poor healing lower extremity wounds for IV antibiotics. Outpatient culture apparently growing MRSA and corynebacterium. No evidence of DVT or osteomyelitis. Vascular surgery consulted who felt debridement was needed. Discussed with oncology, pt will not be able to do debridement for 4 to 6 weeks as patient has been on Avastin . If there is plan for intervention, Avastin  would need to be on hold and anemia would worsen. Kidney function worsened so diuretic was held and IVF given with improvement of renal function. Platelets chronically low and followed by oncology, felt that linezolid  is likely culprit of this.   She returns today, with her daughter, for followup of CHF with a chief complaint of   Labs (10/23): magnesium  1.7 Labs (3/24): TSH 2.93 Labs (4/24): K 3.9, creatinine 1.69, ALT 9, hgb 10, Plt 175 Labs (8/24): K 4.5, creatinine, 1.6 Labs (10/24): K 4.1, creatinine 1.55, Hgb 7.6 Labs (11/24): K 4.6, SCr 1.52 Labs (11/24): K 4.6, SCr 1.82 => 1.78, TSat 3, ferritin 16 Labs (12/24): K 3.9, creatinine 1.83 Labs (01/25): K 4.2, creatinine 1.89 Labs (2/25): hgb 8.5, BNP 517, K 3.5, creatinine 1.86 Labs (3/25): K 4, creatinine 2.13, hgb 7.8 Labs (5/25): K 3.5, creatinine 1.71 => 2.39 Labs (7/25): K 4.6, creatinine  1.71 Labs (9/25): K 3.4 => 4.2, creatinine 1.83 => 2.92, PLT 36 => 394  PMH: 1. COPD 2. Renal cell carcinoma: S/p right kidney resection.  3. CKD stage 3 4. Fe deficiency anemia 5. Atrial fibrillation: Permanent. She has failed amiodarone , Tikosyn , and flecainide  as well as multiple cardioversions. 6. Chronic diastolic CHF: She has history of prior HFrEF with nonischemic cardiomyopathy.  She has a Medtronic ICD.   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  - Echo (6/20): EF 60-65%, mildly dilated RV with PASP 93 mmHg, mild-moderate TR.  - RHC (7/20): mean RA 11, PA 64/18 mean 39, mean PCWP 24 with v waves to 41, CI 4.21, PVR 1.9 WU.  - Cardiomems placement - RHC (11/20): mean RA 13, PA 63/23 mean 25 with v waves to 47 (MR only mild by echo), mean PCWP 25, CI 3.18, PVR 2.6 WU - Echo (11/21): EF 65-70%, moderate LVH, D-shaped septum with mildly decreased RV systolic function, RVSP 85.9 mmHg, severe RAE, severe TR, mod-sev AS. - Echo (2/23): EF 65-70% with mild LVH, D-shaped septum with moderate RV enlargement, and mildly decreased RV function, PASP 92, moderate-severe TR, dilated IVC, moderate aortic stenosis.  - Echo (10/23): EF 60-65%, D-shaped  septum, mild RV enlargement with normal RV systolic function, PASP 90, moderate-severe TR, mild AS, IVC dilated.  - Echo (9/24): LVEF 65-70%, D-shaped septum, mildly reduced RV, RVSP 103 mmHg, biatrial enlargement, severe TR, moderate mitral stenosis with mean gradient of 6 mmHg, dilated IVC - Echo (01/25): EF 65-70%, mild LVH, RV moderately reduced, severely elevated PA pressure 83.9 mmHg, mild MR, moderate MS with mean gradient of 6.0 mmHg, IVC dialted - Echo (5/25): EF 60-65%, mild LVH, d-shaped interventricular septum, severe RV enlargement with moderately decreased RV systolic function, PASP 96 mmHg, severe TR, mild MR with moderate MS (mean gradient 5 mmHg), moderate AS, IVC dilated.  7. Hyperlipidemia 8. Carotid stenosis: Carotid dopplers (3/19)  with 40-59% LICA stenosis.   9. Possible ischemic bowel in 11/20 10. 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 Conservative management per Dr. Melvenia.  - 10/22 bilateral CIA stents.  - 11/22 ABIs: 1.07 left, 0.83 right with patent CIA stents.  11. 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  12. Squamous cell skin cancer.  13. Neuroendocrine tumor of pancreatic head: Treating with sandostatin .  14. COVID 19 in 10/23 15. Tricuspid regurgitation: Severe on 5/25 echo.  16. Aortic stenosis: Moderate on 5/25 echo.  17. Mitral stenosis: Moderate (mean gradient 5 mmHg) on 5/25 echo.     Current Outpatient Medications on File Prior to Visit  Medication Sig Dispense Refill   acetaminophen  (TYLENOL ) 500 MG tablet Take 1,000 mg by mouth every 8 (eight) hours.     Ascorbic Acid  (VITAMIN C  PO) Take 1 tablet by mouth daily.     Cholecalciferol  (VITAMIN D -3 PO) Take 2 capsules by mouth daily.     CRANBERRY PO Take 1 tablet by mouth daily.     Cyanocobalamin  (B-12 PO) Take 1 tablet by mouth daily.     ferrous gluconate  (FERGON) 324 MG tablet Take 1 tablet (324 mg total) by mouth daily with breakfast. 30 tablet 3   fluticasone  (FLONASE ) 50 MCG/ACT nasal spray Place 2 sprays into both nostrils daily as needed for allergies or rhinitis. 16 g 0   leptospermum manuka honey (MEDIHONEY) PSTE paste Apply 1 Application topically daily.     [Paused] metolazone  (ZAROXOLYN ) 2.5 MG tablet Take 1 tablet (2.5 mg total) by mouth 3 (three) times a week. Every Tues., Thurs and Sat. with 40 meq of potassium 90 tablet 3   Multiple Vitamins-Minerals (HAIR SKIN NAILS PO) Take 1 capsule by mouth daily.     polyethylene glycol (MIRALAX  / GLYCOLAX ) 17 g packet Take 17 g by mouth daily.     Potassium Chloride  ER 20 MEQ TBCR Take 1 tablet (20 mEq total) by mouth daily. 30 tablet 2   rosuvastatin  (CRESTOR ) 10 MG tablet Take 1 tablet (10 mg total) by mouth daily. (Patient not  taking: Reported on 12/01/2023) 30 tablet 0   sodium phosphate  (FLEET) ENEM Place 133 mLs (1 enema total) rectally daily as needed for severe constipation.     torsemide  (DEMADEX ) 20 MG tablet Take 5 tablets (100 mg total) by mouth daily.     traMADol  (ULTRAM ) 50 MG tablet Take 1 tablet (50 mg total) by mouth every 12 (twelve) hours as needed. 30 tablet 0   No current facility-administered medications on file prior to visit.     Allergies:   Dilaudid  [hydromorphone ], Fentanyl , Codeine, Morphine  and related, Amoxicillin , Hydrocodone , Oxycodone , Tramadol , Whole blood, and Sulfonamide derivatives    Social History:  The patient  reports that she quit smoking about 21 years ago. Her smoking use included cigarettes. She has a 20.00 pack-year smoking history. She has never used smokeless tobacco. She reports that she does not drink alcohol and does not use drugs.    Family History:  The patient's family history includes Breast cancer in her cousin and another family member; Breast cancer (age of onset: 63) in her maternal aunt; Early death in her father; Heart failure in her mother.    ROS:  Please see the history of present illness.   All other systems are personally reviewed and negative.       PHYSICAL EXAM:  General: Frail appearing female in wheelchair.  Cor: No JVD. Regular rhythm, rate. 3/6 systolic murmur Lungs: clear Abdomen: soft, nontender, nondistended. Extremities: 2+ pitting edema bilateral lower legs. Drying wounds on left shin/ left foot . Slight redness to left shin Neuro:. Affect pleasant   ASSESSMENT AND PLAN:  1. Chronic diastolic CHF: With prominent RV failure. She had remote nonischemic cardiomyopathy with recovery of EF, has Medtronic ICD. Atrial fibrillation likely contributes, but this appears to be permanent now as she has failed multiple anti-arrhythmics and is reasonably rate-controlled. RHC 11/20 showed elevated filling pressures and pulmonary venous hypertension.  Prominent v-waves on PCWP tracing likely due to stiff ventricle/diastolic dysfunction as she had only mild MR on echo. She is RV pacing at a high percentage, but LV EF has remained normal. Most recent echo in 5/25 showed EF 60-65%, mild LVH, d-shaped interventricular septum, severe RV enlargement with moderately decreased RV systolic function, PASP 96 mmHg, severe TR, mild MR with moderate MS (mean gradient 5 mmHg), moderate AS, IVC dilated.  - NYHA class 3 to 3b, worse when she is anemic.  - She does not look markedly volume overloaded today, Severe TR but no JVD.  Lower extremity edema is chronic.   - Continue metolazone  2.5 mg three times weekly.  - Continue torsemide  100 mg bid and KCl 20 daily (40 on metolazone  days).   - Off SGLT2i after UTIs 2. Hypertension:  - BP  - BMET 02/02/24 reviewed: sodium 136, potassium 4.2, creatinine 2.92 & GFR 15. Recheck BMET today 3. Pulmonary hypertension:  Based on RHC 11/20, she has primarily pulmonary venous hypertension, not candidate for pulmonary vasodilators.  4. CKD: Stage 4. Off SGLT2i.  Most recent creatinine 2.92. BMET today 5. Chronic anemia/GI bleeding: Due to small bowel AVMs. She has had transfusions and IV Fe, she was most recently admitted for this in 5/25.  - She is now off anticoagulation.  - Most recent Hg 9.0 & PLT 394. PLT got as low as 36 during recent admission, thought to be due to linezolid  6. Atrial fibrillation: Permanent. Rate control is reasonable off nodal blockers. She has failed amiodarone , Tikosyn , and flecainide  as well as multiple cardioversions.  - Off Eliquis  with recurrent GI bleeding and transfusions.  - Not Watchman candidate due to frailty and poorly compensated CHF.  7. Neuroendocrine tumor of pancreatic head - Ongoing treatment per oncology. Last seen 09/25 8. Valvular disease- - Tricuspid regurgitation: Severe on 5/25 echo. Mitral stenosis: Moderate on 5/25 echo. Follow, not surgical candidate.  - Aortic stenosis:  Moderate on 5/25 echo.  9 Leg wound:: - 09/25 admission for cellulitis / IV antibiotics   Return in 2 months, sooner if needed.    [name] Class, 02/10/24

## 2024-02-10 NOTE — Telephone Encounter (Signed)
 Patient did not show for her Heart Failure Clinic appointment on 02/10/24.

## 2024-02-12 DIAGNOSIS — M109 Gout, unspecified: Secondary | ICD-10-CM | POA: Diagnosis not present

## 2024-02-12 DIAGNOSIS — D649 Anemia, unspecified: Secondary | ICD-10-CM | POA: Diagnosis not present

## 2024-02-12 DIAGNOSIS — L97429 Non-pressure chronic ulcer of left heel and midfoot with unspecified severity: Secondary | ICD-10-CM | POA: Diagnosis not present

## 2024-02-12 DIAGNOSIS — C254 Malignant neoplasm of endocrine pancreas: Secondary | ICD-10-CM | POA: Diagnosis not present

## 2024-02-12 DIAGNOSIS — N1832 Chronic kidney disease, stage 3b: Secondary | ICD-10-CM | POA: Diagnosis not present

## 2024-02-12 DIAGNOSIS — I5032 Chronic diastolic (congestive) heart failure: Secondary | ICD-10-CM | POA: Diagnosis not present

## 2024-02-16 ENCOUNTER — Other Ambulatory Visit: Payer: Self-pay | Admitting: *Deleted

## 2024-02-16 ENCOUNTER — Inpatient Hospital Stay: Attending: Nurse Practitioner

## 2024-02-16 ENCOUNTER — Inpatient Hospital Stay

## 2024-02-16 VITALS — BP 156/49 | HR 59 | Temp 97.7°F | Resp 16

## 2024-02-16 DIAGNOSIS — D631 Anemia in chronic kidney disease: Secondary | ICD-10-CM | POA: Diagnosis not present

## 2024-02-16 DIAGNOSIS — D500 Iron deficiency anemia secondary to blood loss (chronic): Secondary | ICD-10-CM

## 2024-02-16 DIAGNOSIS — D649 Anemia, unspecified: Secondary | ICD-10-CM

## 2024-02-16 DIAGNOSIS — N184 Chronic kidney disease, stage 4 (severe): Secondary | ICD-10-CM | POA: Insufficient documentation

## 2024-02-16 DIAGNOSIS — D3A8 Other benign neuroendocrine tumors: Secondary | ICD-10-CM | POA: Diagnosis not present

## 2024-02-16 DIAGNOSIS — Z79899 Other long term (current) drug therapy: Secondary | ICD-10-CM | POA: Insufficient documentation

## 2024-02-16 DIAGNOSIS — K5520 Angiodysplasia of colon without hemorrhage: Secondary | ICD-10-CM

## 2024-02-16 LAB — SAMPLE TO BLOOD BANK

## 2024-02-16 LAB — CBC WITH DIFFERENTIAL (CANCER CENTER ONLY)
Abs Immature Granulocytes: 0.01 K/uL (ref 0.00–0.07)
Basophils Absolute: 0 K/uL (ref 0.0–0.1)
Basophils Relative: 1 %
Eosinophils Absolute: 0.1 K/uL (ref 0.0–0.5)
Eosinophils Relative: 1 %
HCT: 35.7 % — ABNORMAL LOW (ref 36.0–46.0)
Hemoglobin: 10.8 g/dL — ABNORMAL LOW (ref 12.0–15.0)
Immature Granulocytes: 0 %
Lymphocytes Relative: 15 %
Lymphs Abs: 0.8 K/uL (ref 0.7–4.0)
MCH: 25.3 pg — ABNORMAL LOW (ref 26.0–34.0)
MCHC: 30.3 g/dL (ref 30.0–36.0)
MCV: 83.6 fL (ref 80.0–100.0)
Monocytes Absolute: 0.5 K/uL (ref 0.1–1.0)
Monocytes Relative: 10 %
Neutro Abs: 3.7 K/uL (ref 1.7–7.7)
Neutrophils Relative %: 73 %
Platelet Count: 164 K/uL (ref 150–400)
RBC: 4.27 MIL/uL (ref 3.87–5.11)
RDW: 22.1 % — ABNORMAL HIGH (ref 11.5–15.5)
WBC Count: 5 K/uL (ref 4.0–10.5)
nRBC: 0 % (ref 0.0–0.2)

## 2024-02-16 MED ORDER — EPOETIN ALFA-EPBX 40000 UNIT/ML IJ SOLN
40000.0000 [IU] | Freq: Once | INTRAMUSCULAR | Status: AC
  Administered 2024-02-16: 40000 [IU] via SUBCUTANEOUS
  Filled 2024-02-16: qty 1

## 2024-02-17 DIAGNOSIS — I70248 Atherosclerosis of native arteries of left leg with ulceration of other part of lower left leg: Secondary | ICD-10-CM | POA: Diagnosis not present

## 2024-02-17 DIAGNOSIS — L988 Other specified disorders of the skin and subcutaneous tissue: Secondary | ICD-10-CM | POA: Diagnosis not present

## 2024-02-17 DIAGNOSIS — I70245 Atherosclerosis of native arteries of left leg with ulceration of other part of foot: Secondary | ICD-10-CM | POA: Diagnosis not present

## 2024-02-18 DIAGNOSIS — M109 Gout, unspecified: Secondary | ICD-10-CM | POA: Diagnosis not present

## 2024-02-18 DIAGNOSIS — N1832 Chronic kidney disease, stage 3b: Secondary | ICD-10-CM | POA: Diagnosis not present

## 2024-02-18 DIAGNOSIS — C254 Malignant neoplasm of endocrine pancreas: Secondary | ICD-10-CM | POA: Diagnosis not present

## 2024-02-20 NOTE — Telephone Encounter (Signed)
 Multiple calls to daughter not able to reach. Will close message.

## 2024-02-24 DIAGNOSIS — I70245 Atherosclerosis of native arteries of left leg with ulceration of other part of foot: Secondary | ICD-10-CM | POA: Diagnosis not present

## 2024-02-24 DIAGNOSIS — I70248 Atherosclerosis of native arteries of left leg with ulceration of other part of lower left leg: Secondary | ICD-10-CM | POA: Diagnosis not present

## 2024-02-24 DIAGNOSIS — L988 Other specified disorders of the skin and subcutaneous tissue: Secondary | ICD-10-CM | POA: Diagnosis not present

## 2024-02-25 DIAGNOSIS — S81802A Unspecified open wound, left lower leg, initial encounter: Secondary | ICD-10-CM | POA: Diagnosis not present

## 2024-02-25 DIAGNOSIS — L97429 Non-pressure chronic ulcer of left heel and midfoot with unspecified severity: Secondary | ICD-10-CM | POA: Diagnosis not present

## 2024-02-25 DIAGNOSIS — M1990 Unspecified osteoarthritis, unspecified site: Secondary | ICD-10-CM | POA: Diagnosis not present

## 2024-02-29 NOTE — Assessment & Plan Note (Signed)
 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   -Outpatient EUS 03/04/2022 by Dr. 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 Freida Busman did not recommend surgery in multifocal NET and also due to her age and co-morbidities. -She began first line sandostatin injections q28 days on 03/27/22.  -Dotatate PET scan, done 2 weeks after first injection 04/10/22, shows no significant tracer avidity in the pancrease or distant sites. Findings are likely due to close timing relative to the sandostatin injection blocking receptors. When we repeat this for restaging next year, will arrange 4 weeks after sandostatin -She has persistent anemia, likely secondary to GI bleeding from the tumor and AVM, she has had multiple hospital admission for anemia and bleeding.

## 2024-02-29 NOTE — Assessment & Plan Note (Signed)
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 -She previously had reaction to blood transfusion, or required multiple premedications for blood transfusion -She was recently hospitalized for anemia and a GI bleeding in July 2024. -She is currently on ESA every 2 weeks, IV iron  as needed, and blood transfusion as needed. -Due to her multiple hospital admission for anemia and GI bleeding, she started bevacizumab  for AVM on 02/12/2023. She has missed some treatments due to hospital admission etc.

## 2024-03-01 ENCOUNTER — Ambulatory Visit: Payer: Self-pay

## 2024-03-01 ENCOUNTER — Encounter: Payer: Self-pay | Admitting: Hematology

## 2024-03-01 ENCOUNTER — Inpatient Hospital Stay (HOSPITAL_BASED_OUTPATIENT_CLINIC_OR_DEPARTMENT_OTHER): Admitting: Hematology

## 2024-03-01 ENCOUNTER — Other Ambulatory Visit: Payer: Self-pay | Admitting: Family Medicine

## 2024-03-01 ENCOUNTER — Inpatient Hospital Stay

## 2024-03-01 VITALS — BP 158/54 | HR 59 | Temp 98.1°F | Resp 17 | Ht 66.0 in | Wt 114.7 lb

## 2024-03-01 DIAGNOSIS — D500 Iron deficiency anemia secondary to blood loss (chronic): Secondary | ICD-10-CM | POA: Diagnosis not present

## 2024-03-01 DIAGNOSIS — N184 Chronic kidney disease, stage 4 (severe): Secondary | ICD-10-CM | POA: Diagnosis not present

## 2024-03-01 DIAGNOSIS — K5520 Angiodysplasia of colon without hemorrhage: Secondary | ICD-10-CM

## 2024-03-01 DIAGNOSIS — D3A8 Other benign neuroendocrine tumors: Secondary | ICD-10-CM | POA: Diagnosis not present

## 2024-03-01 DIAGNOSIS — D649 Anemia, unspecified: Secondary | ICD-10-CM

## 2024-03-01 LAB — CBC WITH DIFFERENTIAL (CANCER CENTER ONLY)
Abs Immature Granulocytes: 0.01 K/uL (ref 0.00–0.07)
Basophils Absolute: 0 K/uL (ref 0.0–0.1)
Basophils Relative: 1 %
Eosinophils Absolute: 0 K/uL (ref 0.0–0.5)
Eosinophils Relative: 1 %
HCT: 33.9 % — ABNORMAL LOW (ref 36.0–46.0)
Hemoglobin: 10.3 g/dL — ABNORMAL LOW (ref 12.0–15.0)
Immature Granulocytes: 0 %
Lymphocytes Relative: 19 %
Lymphs Abs: 0.6 K/uL — ABNORMAL LOW (ref 0.7–4.0)
MCH: 25.9 pg — ABNORMAL LOW (ref 26.0–34.0)
MCHC: 30.4 g/dL (ref 30.0–36.0)
MCV: 85.2 fL (ref 80.0–100.0)
Monocytes Absolute: 0.4 K/uL (ref 0.1–1.0)
Monocytes Relative: 11 %
Neutro Abs: 2.1 K/uL (ref 1.7–7.7)
Neutrophils Relative %: 68 %
Platelet Count: 173 K/uL (ref 150–400)
RBC: 3.98 MIL/uL (ref 3.87–5.11)
RDW: 21.7 % — ABNORMAL HIGH (ref 11.5–15.5)
WBC Count: 3.1 K/uL — ABNORMAL LOW (ref 4.0–10.5)
nRBC: 0 % (ref 0.0–0.2)

## 2024-03-01 LAB — SAMPLE TO BLOOD BANK

## 2024-03-01 LAB — TOTAL PROTEIN, URINE DIPSTICK: Protein, ur: NEGATIVE mg/dL

## 2024-03-01 MED ORDER — EPOETIN ALFA-EPBX 40000 UNIT/ML IJ SOLN
40000.0000 [IU] | Freq: Once | INTRAMUSCULAR | Status: AC
  Administered 2024-03-01: 40000 [IU] via SUBCUTANEOUS
  Filled 2024-03-01: qty 1

## 2024-03-01 MED ORDER — OCTREOTIDE ACETATE 20 MG IM KIT
20.0000 mg | PACK | Freq: Once | INTRAMUSCULAR | Status: AC
  Administered 2024-03-01: 20 mg via INTRAMUSCULAR
  Filled 2024-03-01: qty 1

## 2024-03-01 MED ORDER — SODIUM CHLORIDE 0.9 % IV SOLN
5.0000 mg/kg | Freq: Once | INTRAVENOUS | Status: AC
  Administered 2024-03-01: 250 mg via INTRAVENOUS
  Filled 2024-03-01: qty 10

## 2024-03-01 MED ORDER — SODIUM CHLORIDE 0.9 % IV SOLN: Freq: Once | INTRAVENOUS | Status: AC

## 2024-03-01 NOTE — Telephone Encounter (Signed)
 Pt calling to cancel appt tomorrow as she is not feeling well. Pt needs hosp f/u rescheduled, would need a mon or a wed. Would like to be with PCP as she has questions and topics she would like to discuss with PCP.   Copied from CRM [id number]. Topic: Clinical - Red Word Triage >> Mar 01, 2024  3:08 PM Alexandria E wrote: [facility] that prompted transfer to Nurse Triage: Patient got out of rehab this past Thursday, and is very weak, pain in left leg and foot, and no appetite. Wanting to rescheduling hospital follow up that is tomorrow 10/28.

## 2024-03-01 NOTE — Progress Notes (Signed)
 [facility]   [phone] [phone]   Clinic Follow up Note   Patient Care Team: [name], MD as PCP - General (Family Medicine) [name], MD as PCP - Advanced Heart Failure (Cardiology) [name], MD as PCP - Cardiology (Cardiology) [name], MD (Inactive) as PCP - Electrophysiology (Cardiology) [name], MD as Consulting Physician (Cardiology) [name], MD (Inactive) as Consulting Physician (Cardiology) [name], [name], MD as Consulting Physician (Vascular Surgery) [name], MD as Consulting Physician (Hematology and Oncology) [name], MD as Consulting Physician (Oncology) [name], [facility] (Inactive) as Pharmacist (Pharmacist)  Date of Service:  03/01/2024  CHIEF COMPLAINT: f/u of pancreatic neuroendocrine tumor and anemia of chronic blood loss  CURRENT THERAPY:  Bevacizumab  every 4 weeks Retacrit  every 2 weeks Sandostatin  injection every 4 weeks  Oncology History   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   -Outpatient EUS 03/04/2022 by Dr. 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 -She began first line sandostatin  injections q28 days on 03/27/22.  -Dotatate PET scan, done 2 weeks after first injection 04/10/22, shows no significant tracer avidity in the pancrease or distant sites. Findings are likely due to  close timing relative to the sandostatin  injection blocking receptors. 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 -She previously had reaction to blood transfusion, or required multiple premedications for blood transfusion -She was recently hospitalized for anemia and a GI bleeding in July 2024. -She is currently on ESA every 2 weeks, IV iron  as needed, and blood transfusion as needed. -Due to her multiple hospital admission for anemia and GI bleeding, she started bevacizumab  for AVM on 02/12/2023. She has missed some treatments due to hospital admission etc.   Assessment & Plan Iron  deficiency anemia secondary to chronic blood loss Hemoglobin level is stable at 10.3 g/dL, indicating no need for blood transfusion. Iron  levels are not low, suggesting adequate iron  stores. Anemia is being managed with regular injections. - Continue Ritacrit injections every two weeks. - No need for iron  supplementation at this time.  Neuroendocrine tumor (benign, slow-growing) The neuroendocrine tumor is slow-growing and well-managed, with no changes in the last CT scan from April 2025. No immediate need for further imaging unless clinically indicated. - Administer senostatin injection every four weeks. - Plan for a follow-up CT scan in April 2026 if no scans are done  before then.  Plan - She is clinically stable, lab reviewed, hemoglobin 10.3, will proceed  Retacrit  and Sandostatin  injections, and bevacizumab  today. - Will return in 2 weeks with lab and Retacrit  injection - Follow-up in 4 weeks before next dose of bevacizumab    Discussed the use of AI scribe software for clinical note transcription with the patient, who gave verbal consent to proceed.  History of Present Illness [name] is an 89 year old female with anemia in neuroendocrine tumor who presents for follow-up.  She recently spent 17 days in the hospital followed by three weeks in rehabilitation. She feels a little better but still experiences weakness. Her hemoglobin level is currently 10.3. She has not received iron  supplementation recently. She receives an injection every two weeks and a senostatin injection every four weeks.  She has an open wound on her left leg that has started weeping again, particularly when she walks. She is using cream and bandages to manage the wound, which she applies herself daily.  Her last CT scan was in April 2025, which showed no change in the tumor compared to previous scans.     All other systems were reviewed with the patient and are negative.  MEDICAL HISTORY:  Past Medical History:  Diagnosis Date   (HFpEF) heart failure with preserved ejection fraction (HCC)    EF=60-60%   Actinic keratosis 01/17/2015   R forearm   Adjustment disorder with anxiety    Adverse effect of other narcotics, sequela    Intolerance to all narcotics   Anti-Duffy antibodies present    Aortic valve stenosis    Arthritis    some in my hands (11/11/2012)   Atrial fibrillation, permanent (HCC)    Eliquis    Atypical mole 03/25/2018   L forearm - severe   Automatic implantable cardioverter-defibrillator in situ    Avascular necrosis of hip (HCC) 05/03/2011   Carotid artery stenosis 09/2007   a. 09/2007: 60-79% bilateral (stable); b. 10/2008: 40-59% R 60-79%    Cellulitis of left lower extremity 07/13/2020   CKD (chronic kidney disease), stage III  (HCC)    COPD (chronic obstructive pulmonary disease) (HCC)    Coronary artery disease    non-obstructive by 2006 cath   COVID-19 virus infection 02/12/2022   Displaced fracture of left femoral neck (HCC) 10/09/2018   GI bleed 03/28/2020   AVM   High cholesterol    Hypertension 05/20/2011   Hypertr obst cardiomyop    Hypotension, unspecified    cardiac cath 2006..nonobstructive CAD 30-40s lesions.SABRAETT 1/09 nondiagnostic due to poor HR response..Right Renal Cancer 2003   Iron  deficiency anemia    Long term (current) use of anticoagulants    On home oxygen  therapy    3L [place]   Osteoarthritis of right hip    PAD (peripheral artery disease)    Pancreatic cancer (HCC)    sandostatin    PONV (postoperative nausea and vomiting)    Presence of permanent cardiac pacemaker    Pulmonary HTN (HCC)    RECTAL BLEEDING 10/13/2009   Qualifier: Diagnosis of  By: Kerman NP, Paula     Red blood cell antibody positive with compatible PRBC difficult to obtain    Anti FYA (Duffy a) antibody. Must be transfused with PRB which are Duffy Antigen Negative and Crossmatch Compatible   Renal cell carcinoma (HCC) 2005   s/p right nephrectomy   Squamous cell carcinoma of skin 01/17/2015   R lat wrist   Squamous cell carcinoma of skin 01/29/2018  R post upper leg - superficially invasive   Squamous cell carcinoma of skin 03/25/2018   L lat foot   Squamous cell carcinoma of skin 11/09/2020   left lat foot - EDC 01/01/21, recurrent 01/31/21 - MOHs 02/22/21   Squamous cell carcinoma of skin 12/19/2021   Right Posterior Medial Thigh, EDC   Squamous cell carcinoma of skin 12/19/2021   SCC IS, L lat heel, EDC 02/04/2022   Squamous cell carcinoma of skin 01/21/2022   L forearm, EDC 02/04/2022   Squamous cell carcinoma of skin 01/21/2022   R lower leg below knee, EDC 02/04/2022   Squamous cell carcinoma of skin 01/21/2022   SCCIS, R post heel, EDC 02/04/2022   Squamous cell carcinoma of skin 07/01/2022    left lower abdomen, in situ, EDC   Squamous cell carcinoma of skin 07/01/2022   left medial chest, EDC   Urge incontinence    Venous stasis of both lower extremities     SURGICAL HISTORY: Past Surgical History:  Procedure Laterality Date   ABDOMINAL AORTOGRAM W/LOWER EXTREMITY N/A 02/12/2021   Procedure: ABDOMINAL AORTOGRAM W/LOWER EXTREMITY;  Surgeon: Sheree Penne Bruckner, MD;  Location: [facility] INVASIVE CV LAB;  Service: Cardiovascular;  Laterality: N/A;   ABDOMINAL HYSTERECTOMY  1975   for benign causes   APPENDECTOMY     BI-VENTRICULAR PACEMAKER UPGRADE  05/04/2010   BIOPSY  02/28/2019   Procedure: BIOPSY;  Surgeon: Teressa Toribio SQUIBB, MD;  Location: [facility] ENDOSCOPY;  Service: Endoscopy;;   BIOPSY  03/04/2022   Procedure: BIOPSY;  Surgeon: Wilhelmenia Aloha Raddle., MD;  Location: [facility];  Service: Gastroenterology;;   CARDIAC CATHETERIZATION  2006   CARDIOVERSION N/A 02/20/2018   Procedure: CARDIOVERSION;  Surgeon: [name], MD;  Location: [facility] ORS;  Service: Cardiovascular;  Laterality: N/A;   CARDIOVERSION N/A 03/27/2018   Procedure: CARDIOVERSION;  Surgeon: [name], MD;  Location: [facility] ORS;  Service: Cardiovascular;  Laterality: N/A;   CATARACT EXTRACTION W/ INTRAOCULAR LENS  IMPLANT, BILATERAL  01/2006-02-2006   CHOLECYSTECTOMY N/A 11/11/2012   Procedure: LAPAROSCOPIC CHOLECYSTECTOMY WITH INTRAOPERATIVE CHOLANGIOGRAM;  Surgeon: Donnice POUR. Belinda, MD;  Location: [facility] OR;  Service: General;  Laterality: N/A;   COLONOSCOPY WITH PROPOFOL  N/A 02/28/2019   Procedure: COLONOSCOPY WITH PROPOFOL ;  Surgeon: Teressa Toribio SQUIBB, MD;  Location: [facility] ENDOSCOPY;  Service: Endoscopy;  Laterality: N/A;   ENTEROSCOPY N/A 03/30/2020   Procedure: ENTEROSCOPY;  Surgeon: San Sandor GAILS, DO;  Location: [facility] ENDOSCOPY;  Service: Gastroenterology;  Laterality: N/A;   ENTEROSCOPY N/A 02/10/2021   Procedure: ENTEROSCOPY;  Surgeon: Rollin Dover, MD;  Location: [facility] ENDOSCOPY;  Service:  Endoscopy;  Laterality: N/A;   ENTEROSCOPY N/A 02/15/2022   Procedure: ENTEROSCOPY;  Surgeon: Federico Rosario BROCKS, MD;  Location: [facility] ENDOSCOPY;  Service: Gastroenterology;  Laterality: N/A;   ENTEROSCOPY N/A 11/06/2022   Procedure: ENTEROSCOPY;  Surgeon: Wilhelmenia Aloha Raddle., MD;  Location: [facility] ENDOSCOPY;  Service: Gastroenterology;  Laterality: N/A;   EP IMPLANTABLE DEVICE N/A 02/21/2016   Procedure: ICD Generator Changeout;  Surgeon: Elspeth BROCKS Sage, MD;  Location: [facility] INVASIVE CV LAB;  Service: Cardiovascular;  Laterality: N/A;   ESOPHAGOGASTRODUODENOSCOPY N/A 03/04/2022   Procedure: ESOPHAGOGASTRODUODENOSCOPY (EGD);  Surgeon: Wilhelmenia Aloha Raddle., MD;  Location: [facility];  Service: Gastroenterology;  Laterality: N/A;   ESOPHAGOGASTRODUODENOSCOPY (EGD) WITH PROPOFOL  N/A 02/28/2019   Procedure: ESOPHAGOGASTRODUODENOSCOPY (EGD) WITH PROPOFOL ;  Surgeon: Teressa Toribio SQUIBB, MD;  Location: [facility] ENDOSCOPY;  Service: Endoscopy;  Laterality: N/A;   EUS N/A 03/04/2022   Procedure: UPPER  ENDOSCOPIC ULTRASOUND (EUS) RADIAL;  Surgeon: Wilhelmenia Aloha Raddle., MD;  Location: [facility];  Service: Gastroenterology;  Laterality: N/A;   FINE NEEDLE ASPIRATION N/A 03/04/2022   Procedure: FINE NEEDLE ASPIRATION (FNA) LINEAR;  Surgeon: Wilhelmenia Aloha Raddle., MD;  Location: [facility] ENDOSCOPY;  Service: Gastroenterology;  Laterality: N/A;   GIVENS CAPSULE STUDY N/A 03/15/2019   Procedure: GIVENS CAPSULE STUDY;  Surgeon: Eda Iha, MD;  Location: [facility] ENDOSCOPY;  Service: Gastroenterology;  Laterality: N/A;   HOT HEMOSTASIS N/A 03/30/2020   Procedure: HOT HEMOSTASIS (ARGON PLASMA COAGULATION/BICAP);  Surgeon: San Sandor GAILS, DO;  Location: [facility] ENDOSCOPY;  Service: Gastroenterology;  Laterality: N/A;   HOT HEMOSTASIS N/A 02/10/2021   Procedure: HOT HEMOSTASIS (ARGON PLASMA COAGULATION/BICAP);  Surgeon: Rollin Dover, MD;  Location: [facility] ENDOSCOPY;  Service: Endoscopy;  Laterality: N/A;   HOT HEMOSTASIS N/A  02/15/2022   Procedure: HOT HEMOSTASIS (ARGON PLASMA COAGULATION/BICAP);  Surgeon: Federico Rosario BROCKS, MD;  Location: [facility] ENDOSCOPY;  Service: Gastroenterology;  Laterality: N/A;   HOT HEMOSTASIS N/A 11/06/2022   Procedure: HOT HEMOSTASIS (ARGON PLASMA COAGULATION/BICAP);  Surgeon: Wilhelmenia Aloha Raddle., MD;  Location: [facility] ENDOSCOPY;  Service: Gastroenterology;  Laterality: N/A;   INSERT / REPLACE / REMOVE PACEMAKER  05/01/2011   02-28-05-/05-04-10-ICD-MEDTRONIC MAXIMAL DR   IR ANGIOGRAM SELECTIVE EACH ADDITIONAL VESSEL  02/18/2022   IR ANGIOGRAM SELECTIVE EACH ADDITIONAL VESSEL  02/18/2022   IR ANGIOGRAM VISCERAL SELECTIVE  02/16/2022   IR ANGIOGRAM VISCERAL SELECTIVE  02/16/2022   IR EMBO ART  VEN HEMORR LYMPH EXTRAV  INC GUIDE ROADMAPPING  02/16/2022   IR THORACENTESIS ASP PLEURAL SPACE W/IMG GUIDE  02/21/2022   IR THORACENTESIS ASP PLEURAL SPACE W/IMG GUIDE  02/22/2022   IR US  GUIDE VASC ACCESS RIGHT  02/16/2022   JOINT REPLACEMENT     LAPAROSCOPIC CHOLECYSTECTOMY  11/11/2012   LAPAROSCOPIC LYSIS OF ADHESIONS N/A 11/11/2012   Procedure: LAPAROSCOPIC LYSIS OF ADHESIONS;  Surgeon: Donnice POUR. Belinda, MD;  Location: [facility] OR;  Service: General;  Laterality: N/A;   NEPHRECTOMY Right 06/2001    S/P RENAL CELL CANCER   PERIPHERAL VASCULAR INTERVENTION Bilateral 02/12/2021   Procedure: PERIPHERAL VASCULAR INTERVENTION;  Surgeon: Sheree Penne Bruckner, MD;  Location: [facility] INVASIVE CV LAB;  Service: Cardiovascular;  Laterality: Bilateral;  Iliac artery stents   PRESSURE SENSOR/CARDIOMEMS N/A 02/03/2019   Procedure: PRESSURE SENSOR/CARDIOMEMS;  Surgeon: [name], MD;  Location: [facility] INVASIVE CV LAB;  Service: Cardiovascular;  Laterality: N/A;   RIGHT HEART CATH N/A 11/09/2018   Procedure: RIGHT HEART CATH;  Surgeon: [name], MD;  Location: [facility] INVASIVE CV LAB;  Service: Cardiovascular;  Laterality: N/A;   RIGHT HEART CATH N/A 03/08/2019   Procedure: RIGHT HEART CATH;  Surgeon: [name], MD;  Location: [facility] INVASIVE CV LAB;  Service: Cardiovascular;  Laterality: N/A;   SUBMUCOSAL TATTOO INJECTION  02/28/2019   Procedure: SUBMUCOSAL TATTOO INJECTION;  Surgeon: Teressa Toribio SQUIBB, MD;  Location: [facility] ENDOSCOPY;  Service: Endoscopy;;   SUBMUCOSAL TATTOO INJECTION  11/06/2022   Procedure: SUBMUCOSAL TATTOO INJECTION;  Surgeon: Wilhelmenia Aloha Raddle., MD;  Location: [facility] ENDOSCOPY;  Service: Gastroenterology;;   TOTAL HIP ARTHROPLASTY Right 05/03/2011   Procedure: TOTAL HIP ARTHROPLASTY ANTERIOR APPROACH;  Surgeon: Bruckner CINDERELLA Poli;  Location: [facility] ORS;  Service: Orthopedics;  Laterality: Right;  Removal of Cannulated Screws Right Hip, Right Direct Anterior Hip Replacement   TOTAL HIP ARTHROPLASTY Left 10/09/2018   Procedure: TOTAL HIP ARTHROPLASTY ANTERIOR APPROACH;  Surgeon: Fidel Rogue, MD;  Location: [facility] OR;  Service:  Orthopedics;  Laterality: Left;    I have reviewed the social history and family history with the patient and they are unchanged from previous note.  ALLERGIES:  is allergic to codeine, dilaudid  [hydromorphone ], tape, amoxil  [amoxicillin ], asa [aspirin ], ms contin  [morphine ], neurontin  [gabapentin ], and nsaids.  MEDICATIONS:  Current Outpatient Medications  Medication Sig Dispense Refill   acetaminophen  (TYLENOL ) 500 MG tablet Take 1,000 mg by mouth every 8 (eight) hours.     Ascorbic Acid  (VITAMIN C  PO) Take 1 tablet by mouth daily.     Cholecalciferol  (VITAMIN D -3 PO) Take 2 capsules by mouth daily.     CRANBERRY PO Take 1 tablet by mouth daily.     Cyanocobalamin  (B-12 PO) Take 1 tablet by mouth daily.     ferrous gluconate  (FERGON) 324 MG tablet Take 1 tablet (324 mg total) by mouth daily with breakfast. 30 tablet 3   fluticasone  (FLONASE ) 50 MCG/ACT nasal spray Place 2 sprays into both nostrils daily as needed for allergies or rhinitis. 16 g 0   leptospermum manuka honey (MEDIHONEY) PSTE paste Apply 1 Application topically daily.     [Paused]  metolazone  (ZAROXOLYN ) 2.5 MG tablet Take 1 tablet (2.5 mg total) by mouth 3 (three) times a week. Every Tues., Thurs and Sat. with 40 meq of potassium 90 tablet 3   Multiple Vitamins-Minerals (HAIR SKIN NAILS PO) Take 1 capsule by mouth daily.     polyethylene glycol (MIRALAX  / GLYCOLAX ) 17 g packet Take 17 g by mouth daily.     Potassium Chloride  ER 20 MEQ TBCR Take 1 tablet (20 mEq total) by mouth daily. 30 tablet 2   sodium phosphate  (FLEET) ENEM Place 133 mLs (1 enema total) rectally daily as needed for severe constipation.     torsemide  (DEMADEX ) 20 MG tablet Take 5 tablets (100 mg total) by mouth daily.     traMADol  (ULTRAM ) 50 MG tablet Take 1 tablet (50 mg total) by mouth every 12 (twelve) hours as needed. 30 tablet 0   rosuvastatin  (CRESTOR ) 10 MG tablet Take 1 tablet (10 mg total) by mouth daily. (Patient not taking: Reported on 03/01/2024) 30 tablet 0   No current facility-administered medications for this visit.    PHYSICAL EXAMINATION: ECOG PERFORMANCE STATUS: 3 - Symptomatic, >50% confined to bed  Vitals:   03/01/24 1139 03/01/24 1143  BP: (!) 154/58 (!) 158/54  Pulse: (!) 59   Resp: 17   Temp: 98.1 F (36.7 C)   SpO2: 99%    Wt Readings from Last 3 Encounters:  03/01/24 114 lb 11.2 oz (52 kg)  02/02/24 114 lb (51.7 kg)  02/02/24 114 lb (51.7 kg)     GENERAL:alert, no distress and comfortable SKIN: skin color, texture, turgor are normal, (+) significant diffuse skin hyperpigmentation in bilateral lower extremities, left lower extremity and ankle is wrapped by gauze due to skin wounds  EYES: normal, Conjunctiva are pink and non-injected, sclera clear Musculoskeletal:no cyanosis of digits and no clubbing  NEURO: alert & oriented x 3 with fluent speech, no focal motor/sensory deficits  Physical Exam    LABORATORY DATA:  I have reviewed the data as listed    Latest Ref Rng & Units 03/01/2024   10:57 AM 02/16/2024    1:41 PM 02/02/2024    1:24 PM  CBC  WBC 4.0  - 10.5 K/uL 3.1  5.0  7.0   Hemoglobin 12.0 - 15.0 g/dL 89.6  89.1  9.0   Hematocrit 36.0 - 46.0 % 33.9  35.7  30.0   Platelets 150 - 400 K/uL 173  164  394         Latest Ref Rng & Units 02/02/2024    1:24 PM 01/26/2024   11:39 AM 01/26/2024    3:06 AM  CMP  Glucose 70 - 99 mg/dL 899   97   BUN 8 - 23 mg/dL 58   64   Creatinine 9.55 - 1.00 mg/dL 7.07   7.87   Sodium 864 - 145 mmol/L 136   140   Potassium 3.5 - 5.1 mmol/L 4.2   3.8   Chloride 98 - 111 mmol/L 93   96   CO2 22 - 32 mmol/L 32   32   Calcium  8.9 - 10.3 mg/dL 9.2   8.9   Total Protein 6.5 - 8.1 g/dL  7.4    Total Bilirubin 0.0 - 1.2 mg/dL  0.9    Alkaline Phos 38 - 126 U/L  57    AST 15 - 41 U/L  32    ALT 0 - 44 U/L  16        RADIOGRAPHIC STUDIES: I have personally reviewed the radiological images as listed and agreed with the findings in the report. No results found.    No orders of the defined types were placed in this encounter.  All questions were answered. The patient knows to call the clinic with any problems, questions or concerns. No barriers to learning was detected. The total time spent in the appointment was 25 minutes, including review of chart and various tests results, discussions about plan of care and coordination of care plan     Onita Mattock, MD 03/01/2024

## 2024-03-01 NOTE — Patient Instructions (Signed)
 [facility] - A DEPT OF [facility]  Discharge Instructions: Thank you for choosing [facility] to provide your oncology and hematology care.   If you have a lab appointment with the Cancer Center, please go directly to the Cancer Center and check in at the registration area.   Wear comfortable clothing and clothing appropriate for easy access to any Portacath or PICC line.   We strive to give you quality time with your provider. You may need to reschedule your appointment if you arrive late (15 or more minutes).  Arriving late affects you and other patients whose appointments are after yours.  Also, if you miss three or more appointments without notifying the office, you may be dismissed from the clinic at the provider's discretion.      For prescription refill requests, have your pharmacy contact our office and allow 72 hours for refills to be completed.    Today you received the following chemotherapy and/or immunotherapy agents Bevacizumab , Sandostatin , Retacrit       To help prevent nausea and vomiting after your treatment, we encourage you to take your nausea medication as directed.  BELOW ARE SYMPTOMS THAT SHOULD BE REPORTED IMMEDIATELY: *FEVER GREATER THAN 100.4 F (38 C) OR HIGHER *CHILLS OR SWEATING *NAUSEA AND VOMITING THAT IS NOT CONTROLLED WITH YOUR NAUSEA MEDICATION *UNUSUAL SHORTNESS OF BREATH *UNUSUAL BRUISING OR BLEEDING *URINARY PROBLEMS (pain or burning when urinating, or frequent urination) *BOWEL PROBLEMS (unusual diarrhea, constipation, pain near the anus) TENDERNESS IN MOUTH AND THROAT WITH OR WITHOUT PRESENCE OF ULCERS (sore throat, sores in mouth, or a toothache) UNUSUAL RASH, SWELLING OR PAIN  UNUSUAL VAGINAL DISCHARGE OR ITCHING   Items with * indicate a potential emergency and should be followed up as soon as possible or go to the Emergency Department if any problems should occur.  Please show the CHEMOTHERAPY  ALERT CARD or IMMUNOTHERAPY ALERT CARD at check-in to the Emergency Department and triage nurse.  Should you have questions after your visit or need to cancel or reschedule your appointment, please contact [facility] - [facility] DE[facility]  Dept: [phone]  and follow the prompts.  Office hours are 8:00 a.m. to 4:30 p.m. Monday - Friday. Please note that voicemails left after 4:00 p.m. may not be returned until the following business day.  We are closed weekends and major holidays. You have access to a nurse at all times for urgent questions. Please call the main number to the clinic Dept: [phone] and follow the prompts.   For any non-urgent questions, you may also contact your provider using MyChart. We now offer e-Visits for anyone 8 and older to request care online for non-urgent symptoms. For details visit [email or web address].   Also download the MyChart app! Go to the app store, search MyChart, open the app, select [place], and log in with your MyChart username and password.

## 2024-03-02 ENCOUNTER — Other Ambulatory Visit: Payer: Self-pay

## 2024-03-02 ENCOUNTER — Inpatient Hospital Stay: Admitting: Family Medicine

## 2024-03-02 NOTE — Telephone Encounter (Signed)
 Please advise is it ok to hold off on follow up until you are back or if she needs to be scheduled with another provider

## 2024-03-02 NOTE — Telephone Encounter (Signed)
 Called patient and scheduled a visit for 03/10/24

## 2024-03-02 NOTE — Telephone Encounter (Signed)
 Name of Medication: Tramadol  Name of Pharmacy: [facility] Last Fill or Written Date and Quantity: 01/02/24 Last Office Visit and Type: 12/01/23 Next Office Visit and Type: 03/10/24 Last Controlled Substance Agreement Date:  Last UDS:

## 2024-03-02 NOTE — Telephone Encounter (Addendum)
 Complex patient would benefit from sooner appt however I am out of office the rest of the week.  Would offer getting her in with any available provider this week, or may reschedule for early next week with me - I could see Tues or wed pm earliest.

## 2024-03-05 ENCOUNTER — Other Ambulatory Visit: Payer: Self-pay | Admitting: Family Medicine

## 2024-03-05 NOTE — Telephone Encounter (Signed)
 Copied from CRM #[id number]. Topic: Clinical - Prescription Issue >> Mar 05, 2024 10:27 AM Rea ORN wrote: Reason for CRM: Pt called to follow up on Tramadol  rx request that she requested on 03/01/24. Please call back [phone] to advise status of request.

## 2024-03-08 ENCOUNTER — Ambulatory Visit: Payer: PPO

## 2024-03-08 ENCOUNTER — Ambulatory Visit: Payer: Self-pay

## 2024-03-08 NOTE — Telephone Encounter (Signed)
 FYI Only or Action Required?: Action required by provider: patient requesting Tramadol  to be refilled.  Patient was last seen in primary care on 12/01/2023 by [name], MD.  Called Nurse Triage reporting Medication Problem.  Symptoms began a week ago.  Interventions attempted: OTC medications: tylenol .  Symptoms are: unchanged.  Triage Disposition: Call PCP When Office is Open  Patient/caregiver understands and will follow disposition?: No, wishes to speak with PCP             Copied from CRM [id number]. Topic: Clinical - Red Word Triage >> Mar 08, 2024  9:02 AM Mesmerise C wrote: [facility] that prompted transfer to Nurse Triage: Patient has been trying to get her tramadol  refilled since 10/28 states she's in a lot of pain and hasn't gotten any response back and no prescription has been sent in Reason for Disposition  [1] Caller has NON-URGENT medicine question about med that PCP prescribed AND [2] triager unable to answer question  Answer Assessment - Initial Assessment Questions Patient disconnected prior to transfer to this RN This RN called patient back  Patient having pain in left leg and foot Patient states she has been trying to get this refilled for the past week Patient states that she has an appointment  This RN called [facility] to inquire about this and was advised that the patient having an appointment on this Wednesday she can discuss medications with her provider at that time  Patient states that her pharmacy had also sent a fax requesting a refill  Patient states that her pain has been going on for a while now--patient states nothing is different with it and she just wanted a refill of her Tramadol  Patient has been taking Tylenol   Patient is advised to call us  back if anything changes or with any further questions/concerns. Patient is advised that if anything worsens to go to the Emergency Room. Patient verbalized understanding.    1. NAME of  MEDICINE: What medicine(s) are you calling about?     Tramadol  2. QUESTION: What is your question? (e.g., double dose of medicine, side effect)     Patient  3. PRESCRIBER: Who prescribed the medicine? Reason: if prescribed by specialist, call should be referred to that group.     Patient  4. SYMPTOMS: Do you have any symptoms? If Yes, ask: What symptoms are you having?  How bad are the symptoms (e.g., mild, moderate, severe)     Left leg and foot  Protocols used: Medication Question Call-A-AH

## 2024-03-09 NOTE — Telephone Encounter (Signed)
 Copied from CRM #[id number]. Topic: General - Call Back - No Documentation >> Mar 09, 2024  8:25 AM [name] wrote: Reason for CRM: Patient is returning a call she received from the office;however, no documentation on file. Called [facility] to see if they knew what the call was regarding but no notes on their end either. Patient is aware of her appointment tomorrow but was wondering if the call was regarding the Tramadol  medication she requested yesterday.   Called patient left message to call office

## 2024-03-09 NOTE — Telephone Encounter (Signed)
 Initial refill was never sent to me.  Please notify this was refilled.

## 2024-03-10 ENCOUNTER — Ambulatory Visit: Admitting: Family Medicine

## 2024-03-10 ENCOUNTER — Encounter: Payer: Self-pay | Admitting: Family Medicine

## 2024-03-10 VITALS — BP 116/58 | HR 60 | Temp 97.4°F | Ht 66.0 in | Wt 114.0 lb

## 2024-03-10 DIAGNOSIS — I739 Peripheral vascular disease, unspecified: Secondary | ICD-10-CM

## 2024-03-10 DIAGNOSIS — J9611 Chronic respiratory failure with hypoxia: Secondary | ICD-10-CM

## 2024-03-10 DIAGNOSIS — Z95810 Presence of automatic (implantable) cardiac defibrillator: Secondary | ICD-10-CM | POA: Diagnosis not present

## 2024-03-10 DIAGNOSIS — M1A30X1 Chronic gout due to renal impairment, unspecified site, with tophus (tophi): Secondary | ICD-10-CM

## 2024-03-10 DIAGNOSIS — N1832 Chronic kidney disease, stage 3b: Secondary | ICD-10-CM

## 2024-03-10 DIAGNOSIS — D500 Iron deficiency anemia secondary to blood loss (chronic): Secondary | ICD-10-CM

## 2024-03-10 DIAGNOSIS — N184 Chronic kidney disease, stage 4 (severe): Secondary | ICD-10-CM

## 2024-03-10 DIAGNOSIS — D3A8 Other benign neuroendocrine tumors: Secondary | ICD-10-CM

## 2024-03-10 DIAGNOSIS — K5520 Angiodysplasia of colon without hemorrhage: Secondary | ICD-10-CM

## 2024-03-10 DIAGNOSIS — S81802D Unspecified open wound, left lower leg, subsequent encounter: Secondary | ICD-10-CM

## 2024-03-10 DIAGNOSIS — L97929 Non-pressure chronic ulcer of unspecified part of left lower leg with unspecified severity: Secondary | ICD-10-CM | POA: Diagnosis not present

## 2024-03-10 DIAGNOSIS — I272 Pulmonary hypertension, unspecified: Secondary | ICD-10-CM

## 2024-03-10 DIAGNOSIS — I5032 Chronic diastolic (congestive) heart failure: Secondary | ICD-10-CM | POA: Diagnosis not present

## 2024-03-10 DIAGNOSIS — I83029 Varicose veins of left lower extremity with ulcer of unspecified site: Secondary | ICD-10-CM | POA: Diagnosis not present

## 2024-03-10 DIAGNOSIS — I428 Other cardiomyopathies: Secondary | ICD-10-CM | POA: Diagnosis not present

## 2024-03-10 DIAGNOSIS — I38 Endocarditis, valve unspecified: Secondary | ICD-10-CM

## 2024-03-10 DIAGNOSIS — I4821 Permanent atrial fibrillation: Secondary | ICD-10-CM

## 2024-03-10 DIAGNOSIS — Z9981 Dependence on supplemental oxygen: Secondary | ICD-10-CM

## 2024-03-10 DIAGNOSIS — I83892 Varicose veins of left lower extremities with other complications: Secondary | ICD-10-CM | POA: Diagnosis not present

## 2024-03-10 MED ORDER — TRAMADOL HCL 50 MG PO TABS: 50.0000 mg | ORAL_TABLET | Freq: Three times a day (TID) | ORAL | 0 refills | Status: DC | PRN

## 2024-03-10 NOTE — Patient Instructions (Addendum)
 Call wound clinic for an appointment, I will also place referral to [facility] for this. Call heart failure clinic to check on next appointment  Ok to take tramadol  50mg  up to 3 times a day as needed for breakthrough leg pain.  Good to see you today Wound is looking better than it was.

## 2024-03-10 NOTE — Progress Notes (Unsigned)
 Ph: [phone] Fax: [phone]   Patient ID: [name], female    DOB: Jun 17, 1933, 89 y.o.   MRN: [id number]  This visit was conducted in person.  BP (!) 116/58   Pulse 60   Temp (!) 97.4 F (36.3 C) (Oral)   Ht 5' 6 (1.676 m)   Wt 114 lb (51.7 kg) Comment: pt reported  SpO2 96% Comment: pt on portable oxygen   PF (!) 2 L/min   BMI 18.40 kg/m    CC: hosp f/u visit  Subjective:   HPI: [name] is a 89 y.o. female presenting on 03/10/2024 for Hospitalization Follow-up   Recent hospitalization for chronic L leg wound and cellulitis, wound cultures grew MRSA and Corynebacterium, she was unable to complete recommended debridement due to Avastin  treatment. She completed 10d linezolid  and was discharged to [facility] records reviewed. Med rec performed.   Known pancreatic neuroendocrine tumor and anemia of chronic blood loss regularly sees Dr Lanny.  CURRENT THERAPY:  Bevacizumab  every 4 weeks Retacrit  every 2 weeks Sandostatin  injection every 4 weeks  She takes tramadol  for chronic leg pain. She asks about increasing tramadol  frequency - ok to increase to TID. She notes no side effects to tramadol .   She missed advanced CHF clinic appt 02/10/2024 - needs to reschedule this.  She continues torsemide  100mg  daily and metolazone  2.5mg  3d/wk as needed, with extra potassium.   Home health  set up - she has postponed commencement for one to two weeks.  She requests outpatient wound clinic referral.  Other follow up appointments scheduled: sees onc regularly, latest 03/01/2024 ______________________________________________________________________ Hospital admission: 01/09/2024 Hospital discharge: 01/27/2024 - discharged to [facility] Rehab discharge: 02/25/2024 TCM f/u phone call: not performed   Discharge Diagnoses: Principal Problem:   Leg wound, left, initial encounter Active Problems:   Cellulitis of left lower extremity   Acute on  chronic diastolic CHF (congestive heart failure) (HCC)   Normocytic anemia   Primary pancreatic neuroendocrine tumor   Hyperlipidemia   HOCM/IHSS   Atrial fibrillation, permanent (HCC)   COPD (chronic obstructive pulmonary disease) (HCC)   Acute on chronic kidney failure (HCC)   ICD (implantable cardioverter-defibrillator) in place   Hypertension   PAD (peripheral artery disease) (HCC)   History of nephrectomy secondary to RCC   Pulmonary hypertension (HCC)   MRSA (methicillin resistant staph aureus) culture positive   Protein-calorie malnutrition, severe     Relevant past medical, surgical, family and social history reviewed and updated as indicated. Interim medical history since our last visit reviewed. Allergies and medications reviewed and updated. Outpatient Medications Prior to Visit  Medication Sig Dispense Refill   acetaminophen  (TYLENOL ) 500 MG tablet Take 1,000 mg by mouth every 8 (eight) hours.     Ascorbic Acid  (VITAMIN C  PO) Take 1 tablet by mouth daily.     Cholecalciferol  (VITAMIN D -3 PO) Take 2 capsules by mouth daily.     CRANBERRY PO Take 1 tablet by mouth daily.     Cyanocobalamin  (B-12 PO) Take 1 tablet by mouth daily.     ferrous gluconate  (FERGON) 324 MG tablet Take 1 tablet (324 mg total) by mouth daily with breakfast. 30 tablet 3   fluticasone  (FLONASE ) 50 MCG/ACT nasal spray Place 2 sprays into both nostrils daily as needed for allergies or rhinitis. 16 g 0   leptospermum manuka honey (MEDIHONEY) PSTE paste Apply 1 Application topically daily.     Multiple Vitamins-Minerals (HAIR SKIN NAILS PO) Take  1 capsule by mouth daily.     rosuvastatin  (CRESTOR ) 10 MG tablet Take 1 tablet (10 mg total) by mouth daily. 30 tablet 0   torsemide  (DEMADEX ) 20 MG tablet Take 5 tablets (100 mg total) by mouth daily.     traMADol  (ULTRAM ) 50 MG tablet Take 1 tablet (50 mg total) by mouth every 12 (twelve) hours as needed. 30 tablet 0   metolazone  (ZAROXOLYN ) 2.5 MG tablet Take  1 tablet (2.5 mg total) by mouth 3 (three) times a week. Every Tues., Thurs and Sat. with 40 meq of potassium 90 tablet 3   polyethylene glycol (MIRALAX  / GLYCOLAX ) 17 g packet Take 17 g by mouth daily. (Patient not taking: Reported on 03/10/2024)     Potassium Chloride  ER 20 MEQ TBCR Take 1 tablet (20 mEq total) by mouth daily. 30 tablet 2   sodium phosphate  (FLEET) ENEM Place 133 mLs (1 enema total) rectally daily as needed for severe constipation. (Patient not taking: Reported on 03/10/2024)     No facility-administered medications prior to visit.     Per HPI unless specifically indicated in ROS section below Review of Systems  Objective:  BP (!) 116/58   Pulse 60   Temp (!) 97.4 F (36.3 C) (Oral)   Ht 5' 6 (1.676 m)   Wt 114 lb (51.7 kg) Comment: pt reported  SpO2 96% Comment: pt on portable oxygen   PF (!) 2 L/min   BMI 18.40 kg/m   Wt Readings from Last 3 Encounters:  03/10/24 114 lb (51.7 kg)  03/01/24 114 lb 11.2 oz (52 kg)  02/02/24 114 lb (51.7 kg)      Physical Exam    Results for orders placed or performed in visit on 03/01/24  CBC with Differential (Cancer Center Only)   Collection Time: 03/01/24 10:57 AM  Result Value Ref Range   WBC Count 3.1 (L) 4.0 - 10.5 K/uL   RBC 3.98 3.87 - 5.11 MIL/uL   Hemoglobin 10.3 (L) 12.0 - 15.0 g/dL   HCT 66.0 (L) 63.9 - 53.9 %   MCV 85.2 80.0 - 100.0 fL   MCH 25.9 (L) 26.0 - 34.0 pg   MCHC 30.4 30.0 - 36.0 g/dL   RDW 78.2 (H) 88.4 - 84.4 %   Platelet Count 173 150 - 400 K/uL   nRBC 0.0 0.0 - 0.2 %   Neutrophils Relative % 68 %   Neutro Abs 2.1 1.7 - 7.7 K/uL   Lymphocytes Relative 19 %   Lymphs Abs 0.6 (L) 0.7 - 4.0 K/uL   Monocytes Relative 11 %   Monocytes Absolute 0.4 0.1 - 1.0 K/uL   Eosinophils Relative 1 %   Eosinophils Absolute 0.0 0.0 - 0.5 K/uL   Basophils Relative 1 %   Basophils Absolute 0.0 0.0 - 0.1 K/uL   Immature Granulocytes 0 %   Abs Immature Granulocytes 0.01 0.00 - 0.07 K/uL  Total Protein, Urine  dipstick   Collection Time: 03/01/24 10:58 AM  Result Value Ref Range   Protein, ur NEGATIVE NEGATIVE mg/dL  Sample to Blood Bank   Collection Time: 03/01/24 10:58 AM  Result Value Ref Range   Blood Bank Specimen SAMPLE AVAILABLE FOR TESTING    Sample Expiration      03/04/2024,2359 Performed at [facility], [address] [place], [place] [address]    *Note: Due to a large number of results and/or encounters for the requested time period, some results have not been displayed. A complete set  of results can be found in Results Review.   Lab Results  Component Value Date   NA 136 02/02/2024   CL 93 (L) 02/02/2024   K 4.2 02/02/2024   CO2 32 02/02/2024   BUN 58 (H) 02/02/2024   CREATININE 2.92 (H) 02/02/2024   GFRNONAA 15 (L) 02/02/2024   CALCIUM  9.2 02/02/2024   PHOS 3.8 09/06/2023   ALBUMIN  3.6 01/26/2024   GLUCOSE 100 (H) 02/02/2024   Lab Results  Component Value Date   LABURIC 9.9 (H) 02/10/2021   Assessment & Plan:   Problem List Items Addressed This Visit     Chronic diastolic CHF (congestive heart failure) (HCC) - Primary   Primary pancreatic neuroendocrine tumor (HCC)   Chronic hypoxic respiratory failure, on home oxygen  therapy (HCC)   Other Visit Diagnoses       Chronic kidney disease, stage 4 (severe) (HCC)       Relevant Orders   Renal function panel   Uric acid        Meds ordered this encounter  Medications   traMADol  (ULTRAM ) 50 MG tablet    Sig: Take 1 tablet (50 mg total) by mouth 3 (three) times daily as needed for moderate pain (pain score 4-6) (sedation precautions).    Dispense:  70 tablet    Refill:  0    Note new dose - may fill after 03/19/2024    Orders Placed This Encounter  Procedures   Renal function panel   Uric acid    Patient Instructions  Call wound clinic for an appointment, I will also place referral to [facility] for this. Call heart failure clinic to check on next appointment  Ok to take tramadol   50mg  up to 3 times a day as needed for breakthrough leg pain.  Good to see you today Wound is looking better than it was.   Follow up plan: Return in about 3 months (around 06/10/2024) for follow up visit.  Anton Blas, MD

## 2024-03-10 NOTE — Telephone Encounter (Signed)
Appt today w/ PCP.  

## 2024-03-10 NOTE — Telephone Encounter (Signed)
 This was addressed yesterday, tramadol  was already refilled.

## 2024-03-11 ENCOUNTER — Ambulatory Visit: Payer: Self-pay | Admitting: Family Medicine

## 2024-03-11 ENCOUNTER — Encounter: Payer: Self-pay | Admitting: Hematology

## 2024-03-11 ENCOUNTER — Encounter: Payer: Self-pay | Admitting: Family Medicine

## 2024-03-11 LAB — RENAL FUNCTION PANEL
Albumin: 4 g/dL (ref 3.5–5.2)
BUN: 25 mg/dL — ABNORMAL HIGH (ref 6–23)
CO2: 25 meq/L (ref 19–32)
Calcium: 8.7 mg/dL (ref 8.4–10.5)
Chloride: 105 meq/L (ref 96–112)
Creatinine, Ser: 1.3 mg/dL — ABNORMAL HIGH (ref 0.40–1.20)
GFR: 36.85 mL/min — ABNORMAL LOW (ref >60.00)
Glucose, Bld: 130 mg/dL — ABNORMAL HIGH (ref 70–99)
Phosphorus: 3 mg/dL (ref 2.3–4.6)
Potassium: 3.6 meq/L (ref 3.5–5.1)
Sodium: 139 meq/L (ref 135–145)

## 2024-03-11 LAB — URIC ACID: Uric Acid, Serum: 9.3 mg/dL — ABNORMAL HIGH (ref 2.4–7.0)

## 2024-03-11 NOTE — Assessment & Plan Note (Addendum)
 H/o this with recovery of EF

## 2024-03-11 NOTE — Assessment & Plan Note (Addendum)
 It seem she is off ppi, also remains off AC.  Continues bevacizumab  q4wks (VEGF-inhibitor)

## 2024-03-11 NOTE — Assessment & Plan Note (Addendum)
 Primary pulmonary venous hypertension by [facility] 2020, not deemed candidate for pulmonary vasodilators

## 2024-03-11 NOTE — Assessment & Plan Note (Signed)
 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

## 2024-03-11 NOTE — Assessment & Plan Note (Signed)
 Reviewed latest echo 09/2023: mild MR, mod MS, severe TR, severe calcified aortic valve with mild AR and mod AS

## 2024-03-11 NOTE — Assessment & Plan Note (Addendum)
 Continue supplemental oxygen  use, currently using portable oxygen  concentrator.  Anticipate chronic resp failure due to R heart failure

## 2024-03-11 NOTE — Assessment & Plan Note (Signed)
 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

## 2024-03-11 NOTE — Assessment & Plan Note (Addendum)
 Significant improvement in chronic LLE wound since last seen 11/2023 - s/p hospitalization with IV abx and rehab stay with regular wound care.  Wound cultures grew MRSA and Corynebacterium, completed 10d linezolid  course.  She currently doesn't have home health set up - as she prefers to go back to wound clinic - will refer. In interim, continue home wound care as up to now.  Continue tramadol  PRN breakthrough pain stemming from chronic wound Was recommended OR debridement however would need to be off Avastin  for several weeks prior - she opted for continued conservative non-surgical measures.

## 2024-03-11 NOTE — Assessment & Plan Note (Deleted)
 Chronic, stable period on torsemide  100mg  daily with PRN metolazone  - hasn't recently used.

## 2024-03-11 NOTE — Assessment & Plan Note (Signed)
 Update renal panel on high dose torsemide  - lastest Cr 2.9 upcoming hospital discharge.

## 2024-03-11 NOTE — Assessment & Plan Note (Addendum)
 Chronic, continues torsemide  100mg  daily with PRN metolazone  - hasn't recently used metolazone .  Predominant driver of CHF is R-heart failure, also with significant valvular disease by latest echo.  Missed recent advanced CHF clinic appt - encouraged she reschedule this.

## 2024-03-11 NOTE — Assessment & Plan Note (Signed)
 Avoid compression in h/o this s/p stents remotely

## 2024-03-11 NOTE — Assessment & Plan Note (Addendum)
 Appreciate hemeonc care - seen regularly for Avastin , Retacrit , sandostatin  maintenance treatment.

## 2024-03-11 NOTE — Assessment & Plan Note (Signed)
 Hemoglobin recently stable - continue close monitoring by heme/onc.

## 2024-03-12 ENCOUNTER — Other Ambulatory Visit: Payer: Self-pay

## 2024-03-12 DIAGNOSIS — D3A8 Other benign neuroendocrine tumors: Secondary | ICD-10-CM

## 2024-03-15 ENCOUNTER — Inpatient Hospital Stay

## 2024-03-15 ENCOUNTER — Telehealth: Payer: Self-pay | Admitting: Hematology

## 2024-03-17 ENCOUNTER — Inpatient Hospital Stay

## 2024-03-17 ENCOUNTER — Inpatient Hospital Stay: Attending: Nurse Practitioner

## 2024-03-17 VITALS — BP 144/68 | HR 60 | Temp 98.0°F | Resp 17

## 2024-03-17 DIAGNOSIS — D649 Anemia, unspecified: Secondary | ICD-10-CM

## 2024-03-17 DIAGNOSIS — D3A8 Other benign neuroendocrine tumors: Secondary | ICD-10-CM

## 2024-03-17 DIAGNOSIS — N184 Chronic kidney disease, stage 4 (severe): Secondary | ICD-10-CM | POA: Diagnosis not present

## 2024-03-17 DIAGNOSIS — D631 Anemia in chronic kidney disease: Secondary | ICD-10-CM | POA: Diagnosis not present

## 2024-03-17 DIAGNOSIS — D500 Iron deficiency anemia secondary to blood loss (chronic): Secondary | ICD-10-CM

## 2024-03-17 LAB — CBC WITH DIFFERENTIAL (CANCER CENTER ONLY)
Abs Immature Granulocytes: 0.01 K/uL (ref 0.00–0.07)
Basophils Absolute: 0 K/uL (ref 0.0–0.1)
Basophils Relative: 1 %
Eosinophils Absolute: 0 K/uL (ref 0.0–0.5)
Eosinophils Relative: 0 %
HCT: 33.2 % — ABNORMAL LOW (ref 36.0–46.0)
Hemoglobin: 10.1 g/dL — ABNORMAL LOW (ref 12.0–15.0)
Immature Granulocytes: 0 %
Lymphocytes Relative: 11 %
Lymphs Abs: 0.4 K/uL — ABNORMAL LOW (ref 0.7–4.0)
MCH: 26.5 pg (ref 26.0–34.0)
MCHC: 30.4 g/dL (ref 30.0–36.0)
MCV: 87.1 fL (ref 80.0–100.0)
Monocytes Absolute: 0.4 K/uL (ref 0.1–1.0)
Monocytes Relative: 10 %
Neutro Abs: 3.2 K/uL (ref 1.7–7.7)
Neutrophils Relative %: 78 %
Platelet Count: 168 K/uL (ref 150–400)
RBC: 3.81 MIL/uL — ABNORMAL LOW (ref 3.87–5.11)
RDW: 22 % — ABNORMAL HIGH (ref 11.5–15.5)
WBC Count: 4.1 K/uL (ref 4.0–10.5)
nRBC: 0 % (ref 0.0–0.2)

## 2024-03-17 LAB — SAMPLE TO BLOOD BANK

## 2024-03-17 LAB — FERRITIN: Ferritin: 129 ng/mL (ref 11–307)

## 2024-03-17 LAB — TOTAL PROTEIN, URINE DIPSTICK: Protein, ur: NEGATIVE mg/dL

## 2024-03-17 MED ORDER — EPOETIN ALFA-EPBX 40000 UNIT/ML IJ SOLN
40000.0000 [IU] | Freq: Once | INTRAMUSCULAR | Status: AC
  Administered 2024-03-17: 40000 [IU] via SUBCUTANEOUS
  Filled 2024-03-17: qty 1

## 2024-03-28 NOTE — Assessment & Plan Note (Deleted)
 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   -Outpatient EUS 03/04/2022 by Dr. 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 Freida Busman did not recommend surgery in multifocal NET and also due to her age and co-morbidities. -She began first line sandostatin injections q28 days on 03/27/22.  -Dotatate PET scan, done 2 weeks after first injection 04/10/22, shows no significant tracer avidity in the pancrease or distant sites. Findings are likely due to close timing relative to the sandostatin injection blocking receptors. When we repeat this for restaging next year, will arrange 4 weeks after sandostatin -She has persistent anemia, likely secondary to GI bleeding from the tumor and AVM, she has had multiple hospital admission for anemia and bleeding.

## 2024-03-28 NOTE — Assessment & Plan Note (Deleted)
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 -She previously had reaction to blood transfusion, or required multiple premedications for blood transfusion -She was recently hospitalized for anemia and a GI bleeding in July 2024. -She is currently on ESA every 2 weeks, IV iron  as needed, and blood transfusion as needed. -Due to her multiple hospital admission for anemia and GI bleeding, she started bevacizumab  for AVM on 02/12/2023. She has missed some treatments due to hospital admission etc.

## 2024-03-29 ENCOUNTER — Inpatient Hospital Stay

## 2024-03-29 ENCOUNTER — Inpatient Hospital Stay: Admitting: Hematology

## 2024-03-29 ENCOUNTER — Telehealth: Payer: Self-pay

## 2024-03-29 DIAGNOSIS — D500 Iron deficiency anemia secondary to blood loss (chronic): Secondary | ICD-10-CM

## 2024-03-29 DIAGNOSIS — D3A8 Other benign neuroendocrine tumors: Secondary | ICD-10-CM

## 2024-03-29 NOTE — Telephone Encounter (Signed)
 Patient did not arrive to her scheduled office visits.  Contacted patient via telephone call. Patient stated she lvmail stating she had been having n/v/d and requested to reschedule. Canceled appts. Let patient know our scheduler will be contacting her to reschedule.

## 2024-03-30 ENCOUNTER — Telehealth (HOSPITAL_COMMUNITY): Payer: Self-pay | Admitting: *Deleted

## 2024-03-30 ENCOUNTER — Other Ambulatory Visit: Payer: Self-pay | Admitting: Cardiology

## 2024-03-30 MED ORDER — TORSEMIDE 20 MG PO TABS: 100.0000 mg | ORAL_TABLET | Freq: Two times a day (BID) | ORAL | 3 refills | Status: DC

## 2024-03-30 MED ORDER — POTASSIUM CHLORIDE ER 20 MEQ PO TBCR: 40.0000 meq | EXTENDED_RELEASE_TABLET | Freq: Every day | ORAL | 2 refills | Status: DC

## 2024-03-30 MED ORDER — METOLAZONE 2.5 MG PO TABS: 2.5000 mg | ORAL_TABLET | ORAL | 3 refills | Status: DC

## 2024-03-30 NOTE — Telephone Encounter (Signed)
 Pt called concerned about retaining fluid, she states bilat LEE and occ sob. She states she had been in [facility] and Torsemide  was changed to 100 mg Daily which she has been doing but it doesn't seem to work.  Discussed with Dr Rolan, he advised pt to resume previous meds/doses: Torsemide  100 mg BID, KCL 40 meq daily, and metolazone  2.5 mg 2-3 times a week and sch f/u appt and labs soon  Pt is aware and agreeable, she will start increased doses tonight, med list updated. Advised will call her tomorrow to check in and sch f/u appt

## 2024-03-31 MED ORDER — POTASSIUM CHLORIDE ER 20 MEQ PO TBCR: 40.0000 meq | EXTENDED_RELEASE_TABLET | Freq: Every day | ORAL | 2 refills | Status: DC

## 2024-03-31 NOTE — Telephone Encounter (Signed)
 Attempted to call pt to f/u and VM is full

## 2024-04-02 ENCOUNTER — Other Ambulatory Visit: Payer: Self-pay

## 2024-04-02 ENCOUNTER — Inpatient Hospital Stay (HOSPITAL_COMMUNITY)
Admission: EM | Admit: 2024-04-02 | Discharge: 2024-04-12 | DRG: 291 | Disposition: A | Attending: Internal Medicine | Admitting: Internal Medicine

## 2024-04-02 ENCOUNTER — Encounter (HOSPITAL_COMMUNITY): Payer: Self-pay | Admitting: Internal Medicine

## 2024-04-02 ENCOUNTER — Emergency Department (HOSPITAL_COMMUNITY)

## 2024-04-02 DIAGNOSIS — J90 Pleural effusion, not elsewhere classified: Secondary | ICD-10-CM | POA: Diagnosis not present

## 2024-04-02 DIAGNOSIS — N1832 Chronic kidney disease, stage 3b: Secondary | ICD-10-CM | POA: Diagnosis present

## 2024-04-02 DIAGNOSIS — I50813 Acute on chronic right heart failure: Secondary | ICD-10-CM

## 2024-04-02 DIAGNOSIS — J449 Chronic obstructive pulmonary disease, unspecified: Secondary | ICD-10-CM | POA: Diagnosis present

## 2024-04-02 DIAGNOSIS — Z95810 Presence of automatic (implantable) cardiac defibrillator: Secondary | ICD-10-CM | POA: Diagnosis not present

## 2024-04-02 DIAGNOSIS — W19XXXA Unspecified fall, initial encounter: Secondary | ICD-10-CM

## 2024-04-02 DIAGNOSIS — I110 Hypertensive heart disease with heart failure: Secondary | ICD-10-CM | POA: Diagnosis not present

## 2024-04-02 DIAGNOSIS — R0602 Shortness of breath: Secondary | ICD-10-CM

## 2024-04-02 DIAGNOSIS — I4821 Permanent atrial fibrillation: Secondary | ICD-10-CM | POA: Diagnosis present

## 2024-04-02 DIAGNOSIS — I10 Essential (primary) hypertension: Secondary | ICD-10-CM | POA: Diagnosis present

## 2024-04-02 DIAGNOSIS — Z043 Encounter for examination and observation following other accident: Secondary | ICD-10-CM | POA: Diagnosis not present

## 2024-04-02 DIAGNOSIS — E785 Hyperlipidemia, unspecified: Secondary | ICD-10-CM | POA: Diagnosis present

## 2024-04-02 DIAGNOSIS — S81812A Laceration without foreign body, left lower leg, initial encounter: Secondary | ICD-10-CM | POA: Diagnosis not present

## 2024-04-02 DIAGNOSIS — I342 Nonrheumatic mitral (valve) stenosis: Secondary | ICD-10-CM

## 2024-04-02 DIAGNOSIS — L0390 Cellulitis, unspecified: Secondary | ICD-10-CM | POA: Diagnosis present

## 2024-04-02 DIAGNOSIS — L03116 Cellulitis of left lower limb: Secondary | ICD-10-CM

## 2024-04-02 DIAGNOSIS — I350 Nonrheumatic aortic (valve) stenosis: Secondary | ICD-10-CM

## 2024-04-02 DIAGNOSIS — I38 Endocarditis, valve unspecified: Secondary | ICD-10-CM | POA: Diagnosis present

## 2024-04-02 DIAGNOSIS — M85862 Other specified disorders of bone density and structure, left lower leg: Secondary | ICD-10-CM | POA: Diagnosis not present

## 2024-04-02 DIAGNOSIS — I5033 Acute on chronic diastolic (congestive) heart failure: Principal | ICD-10-CM

## 2024-04-02 DIAGNOSIS — J811 Chronic pulmonary edema: Secondary | ICD-10-CM | POA: Diagnosis not present

## 2024-04-02 DIAGNOSIS — D649 Anemia, unspecified: Secondary | ICD-10-CM

## 2024-04-02 DIAGNOSIS — I509 Heart failure, unspecified: Principal | ICD-10-CM

## 2024-04-02 DIAGNOSIS — R609 Edema, unspecified: Secondary | ICD-10-CM | POA: Diagnosis not present

## 2024-04-02 DIAGNOSIS — I272 Pulmonary hypertension, unspecified: Secondary | ICD-10-CM | POA: Diagnosis present

## 2024-04-02 LAB — COMPREHENSIVE METABOLIC PANEL WITH GFR
ALT: 19 U/L (ref 0–44)
AST: 35 U/L (ref 15–41)
Albumin: 3.2 g/dL — ABNORMAL LOW (ref 3.5–5.0)
Alkaline Phosphatase: 62 U/L (ref 38–126)
Anion gap: 9 (ref 5–15)
BUN: 43 mg/dL — ABNORMAL HIGH (ref 8–23)
CO2: 23 mmol/L (ref 22–32)
Calcium: 8.6 mg/dL — ABNORMAL LOW (ref 8.9–10.3)
Chloride: 107 mmol/L (ref 98–111)
Creatinine, Ser: 1.27 mg/dL — ABNORMAL HIGH (ref 0.44–1.00)
GFR, Estimated: 41 mL/min — ABNORMAL LOW (ref >60)
Glucose, Bld: 103 mg/dL — ABNORMAL HIGH (ref 70–99)
Potassium: 3.2 mmol/L — ABNORMAL LOW (ref 3.5–5.1)
Sodium: 139 mmol/L (ref 135–145)
Total Bilirubin: 0.8 mg/dL (ref 0.0–1.2)
Total Protein: 6 g/dL — ABNORMAL LOW (ref 6.5–8.1)

## 2024-04-02 LAB — I-STAT CHEM 8, ED
BUN: 41 mg/dL — ABNORMAL HIGH (ref 8–23)
Calcium, Ion: 1.16 mmol/L (ref 1.15–1.40)
Chloride: 104 mmol/L (ref 98–111)
Creatinine, Ser: 1.5 mg/dL — ABNORMAL HIGH (ref 0.44–1.00)
Glucose, Bld: 99 mg/dL (ref 70–99)
HCT: 27 % — ABNORMAL LOW (ref 36.0–46.0)
Hemoglobin: 9.2 g/dL — ABNORMAL LOW (ref 12.0–15.0)
Potassium: 3.2 mmol/L — ABNORMAL LOW (ref 3.5–5.1)
Sodium: 141 mmol/L (ref 135–145)
TCO2: 22 mmol/L (ref 22–32)

## 2024-04-02 LAB — CBC WITH DIFFERENTIAL/PLATELET
Abs Immature Granulocytes: 0.02 K/uL (ref 0.00–0.07)
Basophils Absolute: 0 K/uL (ref 0.0–0.1)
Basophils Relative: 0 %
Eosinophils Absolute: 0 K/uL (ref 0.0–0.5)
Eosinophils Relative: 0 %
HCT: 27.2 % — ABNORMAL LOW (ref 36.0–46.0)
Hemoglobin: 8.1 g/dL — ABNORMAL LOW (ref 12.0–15.0)
Immature Granulocytes: 0 %
Lymphocytes Relative: 14 %
Lymphs Abs: 0.7 K/uL (ref 0.7–4.0)
MCH: 26.8 pg (ref 26.0–34.0)
MCHC: 29.8 g/dL — ABNORMAL LOW (ref 30.0–36.0)
MCV: 90.1 fL (ref 80.0–100.0)
Monocytes Absolute: 0.6 K/uL (ref 0.1–1.0)
Monocytes Relative: 12 %
Neutro Abs: 3.6 K/uL (ref 1.7–7.7)
Neutrophils Relative %: 74 %
Platelets: 184 K/uL (ref 150–400)
RBC: 3.02 MIL/uL — ABNORMAL LOW (ref 3.87–5.11)
RDW: 21.1 % — ABNORMAL HIGH (ref 11.5–15.5)
WBC: 5 K/uL (ref 4.0–10.5)
nRBC: 0 % (ref 0.0–0.2)

## 2024-04-02 LAB — CK: Total CK: 351 U/L — ABNORMAL HIGH (ref 38–234)

## 2024-04-02 LAB — SAMPLE TO BLOOD BANK

## 2024-04-02 LAB — RESP PANEL BY RT-PCR (RSV, FLU A&B, COVID)  RVPGX2
Influenza A by PCR: NEGATIVE
Influenza B by PCR: NEGATIVE
Resp Syncytial Virus by PCR: NEGATIVE
SARS Coronavirus 2 by RT PCR: NEGATIVE

## 2024-04-02 LAB — BRAIN NATRIURETIC PEPTIDE: B Natriuretic Peptide: 354.1 pg/mL — ABNORMAL HIGH (ref 0.0–100.0)

## 2024-04-02 MED ORDER — TRAMADOL HCL 50 MG PO TABS
50.0000 mg | ORAL_TABLET | Freq: Three times a day (TID) | ORAL | Status: DC | PRN
  Administered 2024-04-02 – 2024-04-10 (×14): 50 mg via ORAL
  Filled 2024-04-02 (×14): qty 1

## 2024-04-02 MED ORDER — IPRATROPIUM-ALBUTEROL 0.5-2.5 (3) MG/3ML IN SOLN: 3.0000 mL | RESPIRATORY_TRACT | Status: DC | PRN

## 2024-04-02 MED ORDER — FUROSEMIDE 10 MG/ML IJ SOLN
40.0000 mg | Freq: Two times a day (BID) | INTRAMUSCULAR | Status: DC
  Administered 2024-04-03: 40 mg via INTRAVENOUS
  Filled 2024-04-02: qty 4

## 2024-04-02 MED ORDER — SODIUM CHLORIDE 0.9 % IV SOLN
1.0000 g | INTRAVENOUS | Status: DC
  Filled 2024-04-02: qty 10

## 2024-04-02 MED ORDER — SODIUM CHLORIDE 0.9% FLUSH: 3.0000 mL | INTRAVENOUS | Status: DC | PRN

## 2024-04-02 MED ORDER — LIDOCAINE-EPINEPHRINE-TETRACAINE (LET) TOPICAL GEL
3.0000 mL | Freq: Once | TOPICAL | Status: DC
  Filled 2024-04-02: qty 3

## 2024-04-02 MED ORDER — FLUTICASONE PROPIONATE 50 MCG/ACT NA SUSP: 2.0000 | Freq: Every day | NASAL | Status: DC | PRN

## 2024-04-02 MED ORDER — SODIUM CHLORIDE 0.9 % IV SOLN: 250.0000 mL | INTRAVENOUS | Status: AC | PRN

## 2024-04-02 MED ORDER — HEPARIN SODIUM (PORCINE) 5000 UNIT/ML IJ SOLN
5000.0000 [IU] | Freq: Three times a day (TID) | INTRAMUSCULAR | Status: DC
  Administered 2024-04-02 – 2024-04-11 (×27): 5000 [IU] via SUBCUTANEOUS
  Filled 2024-04-02 (×27): qty 1

## 2024-04-02 MED ORDER — POTASSIUM CHLORIDE CRYS ER 20 MEQ PO TBCR
40.0000 meq | EXTENDED_RELEASE_TABLET | Freq: Once | ORAL | Status: AC
  Administered 2024-04-02: 40 meq via ORAL
  Filled 2024-04-02: qty 2

## 2024-04-02 MED ORDER — SODIUM CHLORIDE 0.9 % IV SOLN
1.0000 g | Freq: Once | INTRAVENOUS | Status: AC
  Administered 2024-04-02: 1 g via INTRAVENOUS
  Filled 2024-04-02: qty 10

## 2024-04-02 MED ORDER — ONDANSETRON HCL 4 MG/2ML IJ SOLN: 4.0000 mg | Freq: Four times a day (QID) | INTRAMUSCULAR | Status: DC | PRN

## 2024-04-02 MED ORDER — FUROSEMIDE 10 MG/ML IJ SOLN
40.0000 mg | Freq: Once | INTRAMUSCULAR | Status: AC
  Administered 2024-04-02: 40 mg via INTRAVENOUS
  Filled 2024-04-02: qty 4

## 2024-04-02 MED ORDER — POTASSIUM CHLORIDE CRYS ER 20 MEQ PO TBCR
40.0000 meq | EXTENDED_RELEASE_TABLET | Freq: Every day | ORAL | Status: DC
  Administered 2024-04-03 – 2024-04-05 (×4): 40 meq via ORAL
  Filled 2024-04-02 (×3): qty 2

## 2024-04-02 MED ORDER — LIDOCAINE-EPINEPHRINE (PF) 2 %-1:200000 IJ SOLN
10.0000 mL | Freq: Once | INTRAMUSCULAR | Status: AC
  Administered 2024-04-02: 10 mL
  Filled 2024-04-02: qty 20

## 2024-04-02 MED ORDER — ROSUVASTATIN CALCIUM 5 MG PO TABS
10.0000 mg | ORAL_TABLET | Freq: Every day | ORAL | Status: DC
  Administered 2024-04-03 – 2024-04-12 (×10): 10 mg via ORAL
  Filled 2024-04-02 (×10): qty 2

## 2024-04-02 MED ORDER — METOLAZONE 2.5 MG PO TABS: 2.5000 mg | ORAL_TABLET | ORAL | Status: DC

## 2024-04-02 MED ORDER — POLYETHYLENE GLYCOL 3350 17 G PO PACK
17.0000 g | PACK | Freq: Every day | ORAL | Status: DC
  Administered 2024-04-06 – 2024-04-09 (×2): 17 g via ORAL
  Filled 2024-04-02 (×7): qty 1

## 2024-04-02 MED ORDER — VANCOMYCIN HCL IN DEXTROSE 1-5 GM/200ML-% IV SOLN
1000.0000 mg | Freq: Once | INTRAVENOUS | Status: AC
  Administered 2024-04-02: 1000 mg via INTRAVENOUS
  Filled 2024-04-02: qty 200

## 2024-04-02 MED ORDER — METOLAZONE 2.5 MG PO TABS
2.5000 mg | ORAL_TABLET | ORAL | Status: DC
  Administered 2024-04-03 – 2024-04-06 (×2): 2.5 mg via ORAL
  Filled 2024-04-02 (×2): qty 1

## 2024-04-02 MED ORDER — VANCOMYCIN HCL IN DEXTROSE 1-5 GM/200ML-% IV SOLN: 1000.0000 mg | Freq: Once | INTRAVENOUS | Status: DC

## 2024-04-02 MED ORDER — SODIUM CHLORIDE 0.9% FLUSH
3.0000 mL | Freq: Two times a day (BID) | INTRAVENOUS | Status: DC
  Administered 2024-04-02 – 2024-04-12 (×19): 3 mL via INTRAVENOUS

## 2024-04-02 MED ORDER — POTASSIUM CHLORIDE CRYS ER 20 MEQ PO TBCR
40.0000 meq | EXTENDED_RELEASE_TABLET | ORAL | Status: DC
  Administered 2024-04-03 – 2024-04-06 (×2): 40 meq via ORAL
  Filled 2024-04-02 (×2): qty 2

## 2024-04-02 MED ORDER — VANCOMYCIN HCL IN DEXTROSE 1-5 GM/200ML-% IV SOLN: 1000.0000 mg | INTRAVENOUS | Status: DC

## 2024-04-02 MED ORDER — ACETAMINOPHEN 325 MG PO TABS
650.0000 mg | ORAL_TABLET | ORAL | Status: DC | PRN
  Administered 2024-04-03 – 2024-04-12 (×16): 650 mg via ORAL
  Filled 2024-04-02 (×16): qty 2

## 2024-04-02 MED ORDER — FUROSEMIDE 10 MG/ML IJ SOLN: 40.0000 mg | Freq: Every day | INTRAMUSCULAR | Status: DC

## 2024-04-02 NOTE — Progress Notes (Signed)
   04/02/24 1330  Spiritual Encounters  Type of Visit Initial  Care provided to: Family  Reason for visit Trauma  OnCall Visit Yes   Responded to request for assistance. Provided support to daughter.

## 2024-04-02 NOTE — ED Notes (Signed)
 CCMD called for continuous monitoring

## 2024-04-02 NOTE — ED Notes (Signed)
 PA suturing at bedside and pt moving unable to sit still to provide arm for IV access and labs

## 2024-04-02 NOTE — ED Notes (Signed)
 Pt took 50 mg Tramadol  and 1000 mg of Tylenol  at home Refused all other treatment with EMS

## 2024-04-02 NOTE — Progress Notes (Signed)
 Pharmacy Antibiotic Note  [name] is a 89 y.o. female for which pharmacy has been consulted for vancomycin  dosing for cellulitis.  SCr 1.27 WBC 5; T 97.7; HR 62; RR 15 COVID neg / flu neg  Plan: Ceftriaxone  per MD Vancomycin  1000 mg q48hr (eAUC 534.2) unless change in renal function Monitor WBC, fever, renal function, cultures De-escalate when able Levels at steady state     Temp (24hrs), Avg:97.4 F (36.3 C), Min:97.4 F (36.3 C), Max:97.4 F (36.3 C)  Recent Labs  Lab 04/02/24 1537  WBC 5.0  CREATININE 1.27*  1.50*    CrCl cannot be calculated (Unknown ideal weight.).    Allergies  Allergen Reactions   Codeine Nausea And Vomiting and Other (See Comments)    Hallucinations, too- CANNOT HAVE; thinks she may have received Narcan  to reverse the effect   Dilaudid  [Hydromorphone ] Other (See Comments)    Excessive Somnolence- Required Narcan  X2   Tape Other (See Comments)    CAN TOLERATE ONLY EASY-RELEASE, PAPER TAPE, AS THE SKIN IS VERY THIN AND WILL BRUISE AND TEAR VERY EASILY   Amoxil  [Amoxicillin ] Nausea Only   Asa [Aspirin ] Other (See Comments)    Told to avoid due to reduced kidney function, has 1 remaining kidney   Ms Contin  [Morphine ] Nausea And Vomiting and Other (See Comments)    Hallucinations, also   Neurontin  [Gabapentin ] Other (See Comments)    Out of it   Nsaids Other (See Comments)    Told to avoid due to reduced kidney function, has 1 remaining kidney   Microbiology results: Pending  Thank you for allowing pharmacy to be a part of this patient's care.  Dorn Buttner, PharmD, BCPS 04/02/2024 4:33 PM ED Clinical Pharmacist -  [phone]

## 2024-04-02 NOTE — Progress Notes (Signed)
 Patient arrived from .ED VSS alert and oriented x 4 CCMD notifed, CHG bath completed, Bed in lowest position with call light in reach current plan of care in progress

## 2024-04-02 NOTE — TOC CM/SW Note (Signed)
 TOC consult received for d/c planning needs, possible SNF vs. HH. Unit CM to f/u with patient as appropriate.   Merilee Batty, MSN, RN Case Management [phone]

## 2024-04-02 NOTE — H&P (Signed)
 [facility] History and Physical  ASMI FUGERE FMW:[id number] DOB: 1933-08-29 DOA: 04/02/2024  Referring physician: ED  PCP: [name], MD   Patient is coming from: Home  Chief Complaint: Shortness of breath  HPI: Patient is a 89 years old female with past medical history of hypertension, COPD on home oxygen  at 2 L/min, congestive heart failure and chronic left lower extremity wound, history of defibrillator and atrial fibrillation not on anticoagulation presented to hospital after sustaining a fall at home.  Patient stated that she rolled from her bed.  Normally uses a walker for ambulation at home and lives with her family.  Patient did not lose any consciousness or head trauma to her head and rest of the body.  Patient has however been complaining of shortness of breath and dyspnea especially on exertion and lying down for the last few days.  She has been seen by cardiology as outpatient and was on diuretic regimen.  She states that she has been gaining some weight with increased leg swelling as well.  Has mild cough but no fever chills chest pain dizziness or lightheadedness.  Denies any urinary urgency, frequency or dysuria.  Denies any nausea, vomiting but has mild abdominal pain as per the patient which is chronic.    In the ED, vitals with notable for blood pressure of 131/56.  Patient was afebrile.  Initial labs were notable for hypokalemia with potassium of 3.2.  Creatinine of 1.5.  Hemoglobin of 9.2.  BNP was elevated at 354.  COVID influenza and RSV was negative.  Chest x-ray showed mild pulmonary edema with diffuse interstitial markings.  Patient was then considered for admission to the hospital for left leg cellulitis with laceration and CHF exacerbation.  Assessment and Plan Active Problems:   Hyperlipidemia   Atrial fibrillation, permanent (HCC)   COPD (chronic obstructive pulmonary disease) (HCC)   CKD stage 3b, GFR 30-44 ml/min (HCC)   ICD (implantable  cardioverter-defibrillator) in place   Hypertension   Valvular heart disease   Cellulitis   CHF exacerbation (HCC)   Acute on chronic diastolic CHF exacerbation.   History of pulmonary hypertension not a good candidate for pulmonary vasodilators as per advanced heart failure team. Patient presenting with shortness of breath dyspnea orthopnea and increasing lower extremity edema.  Will admit the patient in telemetry unit. Monitor for arrythmias. Continue  IV Lasix .,  fluid restriction, strict intake and output charting.  Daily weights.  Follow CHF protocol.  Trend troponins.  We will closely monitor renal function on diuretics.  2 D Echocardiogram with last known ejection fraction of left ventricular ejection fraction of 60 to 65% with mild LVH.  Follows up with Dr. Venancio as outpatient.  Patient was on torsemide  up to 100 mg daily in the past.  Currently med rec pending.  CKD stage IV.  Continue to monitor closely.  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  Has had transfusions and IV iron  in the past.  Off anticoagulation at this time.  Will continue to monitor hemoglobin levels.  History of atrial fibrillation.  Permanent.  Has failed amiodarone  Tikosyn  and flecainide  in the past and had multiple cardioversions.  Currently on pacemaker.  Not a good anticoagulation candidate.  Valvular heart disease.  Has severe tricuspid regurgitation and moderate mitral and aortic stenosis.  Not a surgical candidate.  Left lower extremity laceration with mild cellulitis on the background of chronic lower extremity wounds..  Needed suturing in the ED with some blood  loss.  Has some cellulitis around the leg so we will continue with antibiotics initiated in the ED.  COPD with chronic hypoxic respiratory failure.  Patient is on 2 L of oxygen  at baseline at home.  Continue nebulizers.  Hypertension.  Will closely monitor blood pressure.  Resume outpatient medication.  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  Follows up with oncology as outpatient.  Deconditioning debility, fall.  Will get PT OT evaluation.  Patient currently lives at her house and great grandson helps her out.  Was at the skilled nursing facility in the past.   DVT Prophylaxis: Heparin  subcu.  Review of Systems:  All systems were reviewed and were negative unless otherwise mentioned in the HPI   Past Medical History:  Diagnosis Date   (HFpEF) heart failure with preserved ejection fraction (HCC)    EF=60-60%   Actinic keratosis 01/17/2015   R forearm   Adjustment disorder with anxiety    Adverse effect of other narcotics, sequela    Intolerance to all narcotics   Anti-Duffy antibodies present    Aortic valve stenosis    Arthritis    some in my hands (11/11/2012)   Atrial fibrillation, permanent (HCC)    Eliquis    Atypical mole 03/25/2018   L forearm - severe   Automatic implantable cardioverter-defibrillator in situ    Avascular necrosis of hip (HCC) 05/03/2011   Carotid artery stenosis 09/2007   a. 09/2007: 60-79% bilateral (stable); b. 10/2008: 40-59% R 60-79%    Cellulitis of left lower extremity 07/13/2020   CKD (chronic kidney disease), stage III (HCC)    COPD (chronic obstructive pulmonary disease) (HCC)    Coronary artery disease    non-obstructive by 2006 cath   COVID-19 virus infection 02/12/2022   Displaced fracture of left femoral neck (HCC) 10/09/2018   GI bleed 03/28/2020   AVM   Gram-negative bacteremia 09/05/2023   High cholesterol    Hypertension 05/20/2011   Hypertr obst cardiomyop    Hypotension, unspecified    cardiac cath 2006..nonobstructive CAD 30-40s lesions.SABRAETT 1/09 nondiagnostic due to poor HR response..Right Renal Cancer 2003   Iron  deficiency anemia    Long term (current) use of anticoagulants    On home oxygen  therapy    3L [place]   Osteoarthritis of right hip    PAD (peripheral artery disease)    Pancreatic cancer (HCC)    sandostatin    PONV  (postoperative nausea and vomiting)    Presence of permanent cardiac pacemaker    Pulmonary HTN (HCC)    RECTAL BLEEDING 10/13/2009   Qualifier: Diagnosis of  By: Kerman NP, Paula     Red blood cell antibody positive with compatible PRBC difficult to obtain    Anti FYA (Duffy a) antibody. Must be transfused with PRB which are Duffy Antigen Negative and Crossmatch Compatible   Renal cell carcinoma (HCC) 2005   s/p right nephrectomy   Squamous cell carcinoma of skin 01/17/2015   R lat wrist   Squamous cell carcinoma of skin 01/29/2018   R post upper leg - superficially invasive   Squamous cell carcinoma of skin 03/25/2018   L lat foot   Squamous cell carcinoma of skin 11/09/2020   left lat foot - EDC 01/01/21, recurrent 01/31/21 - MOHs 02/22/21   Squamous cell carcinoma of skin 12/19/2021   Right Posterior Medial Thigh, EDC   Squamous cell carcinoma of skin 12/19/2021   SCC IS, L lat heel, EDC 02/04/2022   Squamous cell carcinoma of skin  01/21/2022   L forearm, EDC 02/04/2022   Squamous cell carcinoma of skin 01/21/2022   R lower leg below knee, EDC 02/04/2022   Squamous cell carcinoma of skin 01/21/2022   SCCIS, R post heel, EDC 02/04/2022   Squamous cell carcinoma of skin 07/01/2022   left lower abdomen, in situ, EDC   Squamous cell carcinoma of skin 07/01/2022   left medial chest, EDC   Urge incontinence    Venous stasis of both lower extremities    Past Surgical History:  Procedure Laterality Date   ABDOMINAL AORTOGRAM W/LOWER EXTREMITY N/A 02/12/2021   Procedure: ABDOMINAL AORTOGRAM W/LOWER EXTREMITY;  Surgeon: Sheree Penne Bruckner, MD;  Location: [facility] INVASIVE CV LAB;  Service: Cardiovascular;  Laterality: N/A;   ABDOMINAL HYSTERECTOMY  1975   for benign causes   APPENDECTOMY     BI-VENTRICULAR PACEMAKER UPGRADE  05/04/2010   BIOPSY  02/28/2019   Procedure: BIOPSY;  Surgeon: Teressa Toribio SQUIBB, MD;  Location: [facility] ENDOSCOPY;  Service: Endoscopy;;   BIOPSY  03/04/2022    Procedure: BIOPSY;  Surgeon: Wilhelmenia Aloha Raddle., MD;  Location: [facility] ENDOSCOPY;  Service: Gastroenterology;;   CARDIAC CATHETERIZATION  2006   CARDIOVERSION N/A 02/20/2018   Procedure: CARDIOVERSION;  Surgeon: [name], MD;  Location: [facility] ORS;  Service: Cardiovascular;  Laterality: N/A;   CARDIOVERSION N/A 03/27/2018   Procedure: CARDIOVERSION;  Surgeon: [name], MD;  Location: [facility] ORS;  Service: Cardiovascular;  Laterality: N/A;   CATARACT EXTRACTION W/ INTRAOCULAR LENS  IMPLANT, BILATERAL  01/2006-02-2006   CHOLECYSTECTOMY N/A 11/11/2012   Procedure: LAPAROSCOPIC CHOLECYSTECTOMY WITH INTRAOPERATIVE CHOLANGIOGRAM;  Surgeon: Donnice POUR. Belinda, MD;  Location: [facility] OR;  Service: General;  Laterality: N/A;   COLONOSCOPY WITH PROPOFOL  N/A 02/28/2019   Procedure: COLONOSCOPY WITH PROPOFOL ;  Surgeon: Teressa Toribio SQUIBB, MD;  Location: [facility] ENDOSCOPY;  Service: Endoscopy;  Laterality: N/A;   ENTEROSCOPY N/A 03/30/2020   Procedure: ENTEROSCOPY;  Surgeon: San Sandor GAILS, DO;  Location: [facility] ENDOSCOPY;  Service: Gastroenterology;  Laterality: N/A;   ENTEROSCOPY N/A 02/10/2021   Procedure: ENTEROSCOPY;  Surgeon: Rollin Dover, MD;  Location: [facility] ENDOSCOPY;  Service: Endoscopy;  Laterality: N/A;   ENTEROSCOPY N/A 02/15/2022   Procedure: ENTEROSCOPY;  Surgeon: Federico Rosario BROCKS, MD;  Location: [facility] ENDOSCOPY;  Service: Gastroenterology;  Laterality: N/A;   ENTEROSCOPY N/A 11/06/2022   Procedure: ENTEROSCOPY;  Surgeon: Wilhelmenia Aloha Raddle., MD;  Location: [facility] ENDOSCOPY;  Service: Gastroenterology;  Laterality: N/A;   EP IMPLANTABLE DEVICE N/A 02/21/2016   Procedure: ICD Generator Changeout;  Surgeon: Elspeth BROCKS Sage, MD;  Location: [facility] INVASIVE CV LAB;  Service: Cardiovascular;  Laterality: N/A;   ESOPHAGOGASTRODUODENOSCOPY N/A 03/04/2022   Procedure: ESOPHAGOGASTRODUODENOSCOPY (EGD);  Surgeon: Wilhelmenia Aloha Raddle., MD;  Location: [facility] ENDOSCOPY;  Service: Gastroenterology;  Laterality: N/A;    ESOPHAGOGASTRODUODENOSCOPY (EGD) WITH PROPOFOL  N/A 02/28/2019   Procedure: ESOPHAGOGASTRODUODENOSCOPY (EGD) WITH PROPOFOL ;  Surgeon: Teressa Toribio SQUIBB, MD;  Location: [facility] ENDOSCOPY;  Service: Endoscopy;  Laterality: N/A;   EUS N/A 03/04/2022   Procedure: UPPER ENDOSCOPIC ULTRASOUND (EUS) RADIAL;  Surgeon: Wilhelmenia Aloha Raddle., MD;  Location: [facility] ENDOSCOPY;  Service: Gastroenterology;  Laterality: N/A;   FINE NEEDLE ASPIRATION N/A 03/04/2022   Procedure: FINE NEEDLE ASPIRATION (FNA) LINEAR;  Surgeon: Wilhelmenia Aloha Raddle., MD;  Location: [facility] ENDOSCOPY;  Service: Gastroenterology;  Laterality: N/A;   GIVENS CAPSULE STUDY N/A 03/15/2019   Procedure: GIVENS CAPSULE STUDY;  Surgeon: Eda Iha, MD;  Location: [facility] ENDOSCOPY;  Service: Gastroenterology;  Laterality: N/A;   HOT HEMOSTASIS  N/A 03/30/2020   Procedure: HOT HEMOSTASIS (ARGON PLASMA COAGULATION/BICAP);  Surgeon: San Sandor GAILS, DO;  Location: [facility] ENDOSCOPY;  Service: Gastroenterology;  Laterality: N/A;   HOT HEMOSTASIS N/A 02/10/2021   Procedure: HOT HEMOSTASIS (ARGON PLASMA COAGULATION/BICAP);  Surgeon: Rollin Dover, MD;  Location: [facility] ENDOSCOPY;  Service: Endoscopy;  Laterality: N/A;   HOT HEMOSTASIS N/A 02/15/2022   Procedure: HOT HEMOSTASIS (ARGON PLASMA COAGULATION/BICAP);  Surgeon: Federico Rosario BROCKS, MD;  Location: [facility] ENDOSCOPY;  Service: Gastroenterology;  Laterality: N/A;   HOT HEMOSTASIS N/A 11/06/2022   Procedure: HOT HEMOSTASIS (ARGON PLASMA COAGULATION/BICAP);  Surgeon: Wilhelmenia Aloha Raddle., MD;  Location: [facility] ENDOSCOPY;  Service: Gastroenterology;  Laterality: N/A;   INSERT / REPLACE / REMOVE PACEMAKER  05/01/2011   02-28-05-/05-04-10-ICD-MEDTRONIC MAXIMAL DR   IR ANGIOGRAM SELECTIVE EACH ADDITIONAL VESSEL  02/18/2022   IR ANGIOGRAM SELECTIVE EACH ADDITIONAL VESSEL  02/18/2022   IR ANGIOGRAM VISCERAL SELECTIVE  02/16/2022   IR ANGIOGRAM VISCERAL SELECTIVE  02/16/2022   IR EMBO ART  VEN HEMORR LYMPH EXTRAV  INC GUIDE  ROADMAPPING  02/16/2022   IR THORACENTESIS ASP PLEURAL SPACE W/IMG GUIDE  02/21/2022   IR THORACENTESIS ASP PLEURAL SPACE W/IMG GUIDE  02/22/2022   IR US  GUIDE VASC ACCESS RIGHT  02/16/2022   JOINT REPLACEMENT     LAPAROSCOPIC CHOLECYSTECTOMY  11/11/2012   LAPAROSCOPIC LYSIS OF ADHESIONS N/A 11/11/2012   Procedure: LAPAROSCOPIC LYSIS OF ADHESIONS;  Surgeon: Donnice POUR. Belinda, MD;  Location: [facility] OR;  Service: General;  Laterality: N/A;   NEPHRECTOMY Right 06/2001    S/P RENAL CELL CANCER   PERIPHERAL VASCULAR INTERVENTION Bilateral 02/12/2021   Procedure: PERIPHERAL VASCULAR INTERVENTION;  Surgeon: Sheree Penne Bruckner, MD;  Location: [facility] INVASIVE CV LAB;  Service: Cardiovascular;  Laterality: Bilateral;  Iliac artery stents   PRESSURE SENSOR/CARDIOMEMS N/A 02/03/2019   Procedure: PRESSURE SENSOR/CARDIOMEMS;  Surgeon: [name], MD;  Location: [facility] INVASIVE CV LAB;  Service: Cardiovascular;  Laterality: N/A;   RIGHT HEART CATH N/A 11/09/2018   Procedure: RIGHT HEART CATH;  Surgeon: [name], MD;  Location: [facility] INVASIVE CV LAB;  Service: Cardiovascular;  Laterality: N/A;   RIGHT HEART CATH N/A 03/08/2019   Procedure: RIGHT HEART CATH;  Surgeon: [name], MD;  Location: [facility] INVASIVE CV LAB;  Service: Cardiovascular;  Laterality: N/A;   SUBMUCOSAL TATTOO INJECTION  02/28/2019   Procedure: SUBMUCOSAL TATTOO INJECTION;  Surgeon: Teressa Toribio SQUIBB, MD;  Location: [facility] ENDOSCOPY;  Service: Endoscopy;;   SUBMUCOSAL TATTOO INJECTION  11/06/2022   Procedure: SUBMUCOSAL TATTOO INJECTION;  Surgeon: Wilhelmenia Aloha Raddle., MD;  Location: [facility] ENDOSCOPY;  Service: Gastroenterology;;   TOTAL HIP ARTHROPLASTY Right 05/03/2011   Procedure: TOTAL HIP ARTHROPLASTY ANTERIOR APPROACH;  Surgeon: Bruckner CINDERELLA Poli;  Location: [facility] ORS;  Service: Orthopedics;  Laterality: Right;  Removal of Cannulated Screws Right Hip, Right Direct Anterior Hip Replacement   TOTAL HIP ARTHROPLASTY Left 10/09/2018    Procedure: TOTAL HIP ARTHROPLASTY ANTERIOR APPROACH;  Surgeon: Fidel Rogue, MD;  Location: [facility] OR;  Service: Orthopedics;  Laterality: Left;    Social History:  reports that she quit smoking about 22 years ago. Her smoking use included cigarettes. She started smoking about 62 years ago. She has a 20 pack-year smoking history. She has never used smokeless tobacco. She reports that she does not drink alcohol and does not use drugs.  Allergies  Allergen Reactions   Codeine Nausea And Vomiting and Other (See Comments)    Hallucinations, too- CANNOT HAVE; thinks she may have  received Narcan  to reverse the effect   Dilaudid  [Hydromorphone ] Other (See Comments)    Excessive Somnolence- Required Narcan  X2   Tape Other (See Comments)    CAN TOLERATE ONLY EASY-RELEASE, PAPER TAPE, AS THE SKIN IS VERY THIN AND WILL BRUISE AND TEAR VERY EASILY   Amoxil  [Amoxicillin ] Nausea Only   Dorethia Flow ] Other (See Comments)    Told to avoid due to reduced kidney function, has 1 remaining kidney   Ms Contin  [Morphine ] Nausea And Vomiting and Other (See Comments)    Hallucinations, also   Neurontin  [Gabapentin ] Other (See Comments)    Out of it   Nsaids Other (See Comments)    Told to avoid due to reduced kidney function, has 1 remaining kidney    Family History  Problem Relation Age of Onset   Heart failure Mother    Early death Father        car accident   Breast cancer Maternal Aunt 67   Breast cancer Cousin    Breast cancer Other    Colon cancer Neg Hx    Esophageal cancer Neg Hx    Liver disease Neg Hx    Pancreatic cancer Neg Hx    Rectal cancer Neg Hx      Prior to Admission medications   Medication Sig Start Date End Date Taking? Authorizing Provider  acetaminophen  (TYLENOL ) 500 MG tablet Take 1,000 mg by mouth every 8 (eight) hours.    [provider]  Ascorbic Acid  (VITAMIN C  PO) Take 1 tablet by mouth daily.    [provider]  Cholecalciferol  (VITAMIN D -3 PO)  Take 2 capsules by mouth daily.    [provider]  CRANBERRY PO Take 1 tablet by mouth daily.    [provider]  Cyanocobalamin  (B-12 PO) Take 1 tablet by mouth daily.    [provider]  ferrous gluconate  (FERGON) 324 MG tablet Take 1 tablet (324 mg total) by mouth daily with breakfast. 11/15/22   Rai, Nydia POUR, MD  fluticasone  (FLONASE ) 50 MCG/ACT nasal spray Place 2 sprays into both nostrils daily as needed for allergies or rhinitis. 07/04/22   [name], MD  leptospermum manuka honey (MEDIHONEY) PSTE paste Apply 1 Application topically daily. 01/23/24   Franchot Novel, MD  metolazone  (ZAROXOLYN ) 2.5 MG tablet Take 1 tablet (2.5 mg total) by mouth 3 (three) times a week. Every Tues., Thurs and Sat. with 40 meq of potassium 03/31/24   McLean, Dalton S, MD  Multiple Vitamins-Minerals (HAIR SKIN NAILS PO) Take 1 capsule by mouth daily.    [provider]  polyethylene glycol (MIRALAX  / GLYCOLAX ) 17 g packet Take 17 g by mouth daily. Patient not taking: Reported on 03/10/2024 01/27/24   Franchot Novel, MD  Potassium Chloride  ER 20 MEQ TBCR Take 2 tablets (40 mEq total) by mouth daily. Take extra 2 tabs with metolazone  03/31/24   McLean, Dalton S, MD  rosuvastatin  (CRESTOR ) 10 MG tablet Take 1 tablet (10 mg total) by mouth daily. 06/20/23   Maree Hue, MD  sodium phosphate  (FLEET) ENEM Place 133 mLs (1 enema total) rectally daily as needed for severe constipation. Patient not taking: Reported on 03/10/2024 01/26/24   Franchot Novel, MD  torsemide  (DEMADEX ) 20 MG tablet Take 5 tablets (100 mg total) by mouth 2 (two) times daily. 03/30/24   [name], MD  traMADol  (ULTRAM ) 50 MG tablet Take 1 tablet (50 mg total) by mouth 3 (three) times daily as needed for moderate pain (pain  score 4-6) (sedation precautions). 03/10/24   [name], MD    Physical Exam:  Vitals:   04/02/24 1317 04/02/24 1615  BP: (!) 131/46 117/63  Pulse: 69 (!) 55   Resp: 14 16  Temp: (!) 97.4 F (36.3 C) (!) 97.3 F (36.3 C)  TempSrc: Axillary   SpO2: 99% 98%   Wt Readings from Last 3 Encounters:  03/10/24 51.7 kg  03/01/24 52 kg  02/02/24 51.7 kg   There is no height or weight on file to calculate BMI.  General:  Average built, not in obvious distress elderly female, Communicative, on nasal cannula oxygen  HENT: Normocephalic, No scleral pallor or icterus noted. Oral mucosa is moist.  Chest: Coarse breath sounds noted..  Decreased breath sounds bilaterally. CVS: S1 &S2 heard. No murmur.  Regular rate and rhythm. Abdomen: Soft, nontender, nondistended.  Bowel sounds are heard. No abdominal mass palpated Extremities: No cyanosis, clubbing with bilateral lower extremity edema.  Bilateral lower extremity chronic venous insufficiency. Psych: Alert, awake and oriented, normal mood CNS:  No cranial nerve deficits.  Power equal in all extremities.  Generalized weakness noted. Skin: Warm and dry.  Left lower extremity laceration status post suture with some surrounding erythema and tenderness.  Labs on Admission:   CBC: Recent Labs  Lab 04/02/24 1537  WBC 5.0  NEUTROABS 3.6  HGB 8.1*  9.2*  HCT 27.2*  27.0*  MCV 90.1  PLT 184    Basic Metabolic Panel: Recent Labs  Lab 04/02/24 1537  NA 139  141  K 3.2*  3.2*  CL 107  104  CO2 23  GLUCOSE 103*  99  BUN 43*  41*  CREATININE 1.27*  1.50*  CALCIUM  8.6*    Liver Function Tests: Recent Labs  Lab 04/02/24 1537  AST 35  ALT 19  ALKPHOS 62  BILITOT 0.8  PROT 6.0*  ALBUMIN  3.2*   No results for input(s): LIPASE, AMYLASE in the last 168 hours. No results for input(s): AMMONIA in the last 168 hours.  Cardiac Enzymes: Recent Labs  Lab 04/02/24 1537  CKTOTAL 351*    BNP (last 3 results) Recent Labs    06/24/23 1621 07/10/23 1641 04/02/24 1537  BNP 516.7* 322.2* 354.1*    ProBNP (last 3 results) No results for input(s): PROBNP in the last 8760  hours.  CBG: No results for input(s): GLUCAP in the last 168 hours.     Drugs of Abuse  No results found for: LABOPIA, COCAINSCRNUR, LABBENZ, AMPHETMU, THCU, LABBARB    Radiological Exams on Admission: DG Chest Portable 1 View Result Date: 04/02/2024 EXAM: 1 VIEW(S) XRAY OF THE CHEST 04/02/2024 02:47:00 PM COMPARISON: 09/03/2023 CLINICAL HISTORY: sob FINDINGS: LINES, TUBES AND DEVICES: AICD leads are unchanged and appear in appropriate position. LUNGS AND PLEURA: Mild pulmonary edema with diffuse accentuated interstitial markings. Blunting of the costophrenic angles favoring pleural effusions, right greater than left. No pneumothorax. HEART AND MEDIASTINUM: Cardiomegaly. Calcified aorta. AICD leads are unchanged and appear in appropriate position. BONES AND SOFT TISSUES: Stable benign appearing sclerotic lesion proximal left humerus. No acute osseous abnormality. IMPRESSION: 1. Mild pulmonary edema with diffuse accentuated interstitial markings. 2. Blunting of the costophrenic angles favoring pleural effusions, right greater than left. 3. Cardiomegaly with unchanged AICD leads. Electronically signed by: Ryan Salvage MD 04/02/2024 03:23 PM EST RP Workstation: [id number]   DG Tibia/Fibula Left Port Result Date: 04/02/2024 CLINICAL DATA:  Fall EXAM: PORTABLE LEFT TIBIA AND FIBULA - 2 VIEW COMPARISON:  None Available. FINDINGS: There  is soft tissue laceration or ulceration of the anterior lower extremity. There is no underlying periosteal reaction, fracture or dislocation. The bones are diffusely osteopenic. There is diffuse soft tissue swelling of the lower extremity. IMPRESSION: Soft tissue laceration or ulceration of the anterior lower extremity. No underlying periosteal reaction, fracture or dislocation. Electronically Signed   By: Greig Pique M.D.   On: 04/02/2024 15:16    EKG: Personally reviewed by me which shows atrial fibrillation   Consultant: Heart failure  team  Code Status: DNR  Microbiology blood cultures noted  Antibiotics: Vancomycin  and Rocephin  IV  Family Communication:  Patients' condition and plan of care including tests being ordered have been discussed with the patient  who indicate understanding and agree with the plan.  I tried to reach out to the  patient's daughter but was unable to reach out   Status is: Inpatient   Severity of Illness: The appropriate patient status for this patient is INPATIENT. Inpatient status is judged to be reasonable and necessary in order to provide the required intensity of service to ensure the patient's safety. The patient's presenting symptoms, physical exam findings, and initial radiographic and laboratory data in the context of their chronic comorbidities is felt to place them at high risk for further clinical deterioration. Furthermore, it is not anticipated that the patient will be medically stable for discharge from the hospital within 2 midnights of admission.   * I certify that at the point of admission it is my clinical judgment that the patient will require inpatient hospital care spanning beyond 2 midnights from the point of admission due to high intensity of service, high risk for further deterioration and high frequency of surveillance required.*  Signed, Vernal Alstrom, MD [facility] 04/02/2024

## 2024-04-02 NOTE — ED Provider Notes (Signed)
 [place] EMERGENCY DEPARTMENT AT [facility] Provider Note   CSN: [id number] Arrival date & time: 04/02/24  1306     Patient presents with: Leg Injury   [name] is a 89 y.o. female.  {Add pertinent medical, surgical, social history, OB history to HPI:3768} 89 year old female history of pacemaker, aortic valve stenosis and atrial fibrillation not currently on anticoagulation, COPD on home oxygen , with a history of chronic left lower extremity wounds who presents emergency department after a fall.  History obtained per patient and family.  Has been at home for several weeks.  Today was trying to get out of bed and had a fall.  Injured her left leg.  Had some bleeding.  Was on the ground for approximately 2 hours.  No head strike or LOC.  They also report that she has had worsened lower extremity swelling over the past few days.  Says that she has had some shortness of breath as well with low pulse ox readings at home.  They are concerned because they have been changing around her diuretic recently  Last tetanus 2022       Prior to Admission medications   Medication Sig Start Date End Date Taking? Authorizing Provider  acetaminophen  (TYLENOL ) 500 MG tablet Take 1,000 mg by mouth every 8 (eight) hours.    [provider]  Ascorbic Acid  (VITAMIN C  PO) Take 1 tablet by mouth daily.    [provider]  Cholecalciferol  (VITAMIN D -3 PO) Take 2 capsules by mouth daily.    [provider]  CRANBERRY PO Take 1 tablet by mouth daily.    [provider]  Cyanocobalamin  (B-12 PO) Take 1 tablet by mouth daily.    [provider]  ferrous gluconate  (FERGON) 324 MG tablet Take 1 tablet (324 mg total) by mouth daily with breakfast. 11/15/22   Rai, Nydia POUR, MD  fluticasone  (FLONASE ) 50 MCG/ACT nasal spray Place 2 sprays into both nostrils daily as needed for allergies or rhinitis. 07/04/22   [name], MD  leptospermum manuka  honey (MEDIHONEY) PSTE paste Apply 1 Application topically daily. 01/23/24   Franchot Novel, MD  metolazone  (ZAROXOLYN ) 2.5 MG tablet Take 1 tablet (2.5 mg total) by mouth 3 (three) times a week. Every Tues., Thurs and Sat. with 40 meq of potassium 03/31/24   McLean, Dalton S, MD  Multiple Vitamins-Minerals (HAIR SKIN NAILS PO) Take 1 capsule by mouth daily.    [provider]  polyethylene glycol (MIRALAX  / GLYCOLAX ) 17 g packet Take 17 g by mouth daily. Patient not taking: Reported on 03/10/2024 01/27/24   Franchot Novel, MD  Potassium Chloride  ER 20 MEQ TBCR Take 2 tablets (40 mEq total) by mouth daily. Take extra 2 tabs with metolazone  03/31/24   McLean, Dalton S, MD  rosuvastatin  (CRESTOR ) 10 MG tablet Take 1 tablet (10 mg total) by mouth daily. 06/20/23   Maree Hue, MD  sodium phosphate  (FLEET) ENEM Place 133 mLs (1 enema total) rectally daily as needed for severe constipation. Patient not taking: Reported on 03/10/2024 01/26/24   Franchot Novel, MD  torsemide  (DEMADEX ) 20 MG tablet Take 5 tablets (100 mg total) by mouth 2 (two) times daily. 03/30/24   [name], MD  traMADol  (ULTRAM ) 50 MG tablet Take 1 tablet (50 mg total) by mouth 3 (three) times daily as needed for moderate pain (pain score 4-6) (sedation precautions). 03/10/24   [name], MD    Allergies: Codeine, Dilaudid  [hydromorphone ], Tape, Amoxil  [amoxicillin ],  Asa [aspirin ], Ms contin  [morphine ], Neurontin  [gabapentin ], and Nsaids    Review of Systems  Updated Vital Signs BP (!) 131/46 (BP Location: Left Arm)   Pulse 69   Temp (!) 97.4 F (36.3 C) (Axillary)   Resp 14   SpO2 99%   Physical Exam Vitals and nursing note reviewed.  Constitutional:      General: She is not in acute distress.    Appearance: She is well-developed.  HENT:     Head: Normocephalic and atraumatic.     Right Ear: External ear normal.     Left Ear: External ear normal.     Nose: Nose normal.  Eyes:      Extraocular Movements: Extraocular movements intact.     Conjunctiva/sclera: Conjunctivae normal.     Pupils: Pupils are equal, round, and reactive to light.  Cardiovascular:     Rate and Rhythm: Normal rate and regular rhythm.     Heart sounds: No murmur heard. Pulmonary:     Effort: Pulmonary effort is normal. No respiratory distress.     Breath sounds: Normal breath sounds.     Comments: On 3 L nasal cannula Musculoskeletal:     Cervical back: Normal range of motion and neck supple.     Right lower leg: Edema present.     Left lower leg: Edema present.     Comments: Ulceration of the left foot and distal left shin that appears clean dry and intact.  Erythema of the left lower extremity.  Large skin tear of the left shin with bleeding present.  Skin:    General: Skin is warm and dry.  Neurological:     Mental Status: She is alert and oriented to person, place, and time. Mental status is at baseline.  Psychiatric:        Mood and Affect: Mood normal.     (all labs ordered are listed, but only abnormal results are displayed) Labs Reviewed  RESP PANEL BY RT-PCR (RSV, FLU A&B, COVID)  RVPGX2  CULTURE, BLOOD (ROUTINE X 2)  CULTURE, BLOOD (ROUTINE X 2)  CBC WITH DIFFERENTIAL/PLATELET  COMPREHENSIVE METABOLIC PANEL WITH GFR  BRAIN NATRIURETIC PEPTIDE  I-STAT CHEM 8, ED  SAMPLE TO BLOOD BANK    EKG: None  Radiology: No results found.  {Document cardiac monitor, telemetry assessment procedure when appropriate:32947} .Laceration Repair  Date/Time: 04/02/2024 2:27 PM  Performed by: Yolande Lamar BROCKS, MD Authorized by: Yolande Lamar BROCKS, MD   Consent:    Consent obtained:  Emergent situation   Consent given by:  Patient Universal protocol:    Patient identity confirmed:  Verbally with patient Anesthesia:    Anesthesia method:  Local infiltration   Local anesthetic:  Lidocaine  2% WITH epi Laceration details:    Location:  Leg   Leg location:  L lower leg   Length  (cm):  20 Exploration:    Hemostasis achieved with:  Tied off vessels and direct pressure Skin repair:    Repair method:  Sutures   Suture size:  4-0   Suture material:  Plain gut   Suture technique:  Figure eight   Number of sutures:  2 Approximation:    Approximation:  Close Repair type:    Repair type:  Simple Post-procedure details:    Dressing:  Open (no dressing)   Procedure completion:  Tolerated well, no immediate complications Comments:     Two absorbable figure of 8 sutures placed for hemostasis after combat gauze was unsuccessful in controlling her bleeding.  Medications Ordered in the ED  lidocaine -EPINEPHrine -tetracaine (LET) topical gel (has no administration in time range)  vancomycin  (VANCOCIN ) IVPB 1000 mg/200 mL premix (has no administration in time range)  cefTRIAXone  (ROCEPHIN ) 1 g in sodium chloride  0.9 % 100 mL IVPB (has no administration in time range)      {Click here for ABCD2, HEART and other calculators REFRESH Note before signing:1}                              Medical Decision Making Amount and/or Complexity of Data Reviewed Labs: ordered. Radiology: ordered.  Risk Prescription drug management.   ***  {Document critical care time when appropriate  Document review of labs and clinical decision tools ie CHADS2VASC2, etc  Document your independent review of radiology images and any outside records  Document your discussion with family members, caretakers and with consultants  Document social determinants of health affecting pt's care  Document your decision making why or why not admission, treatments were needed:32947:::1}   Final diagnoses:  None    ED Discharge Orders     None

## 2024-04-02 NOTE — ED Triage Notes (Signed)
 Pt BIB EMS from home after family found pt on floor next to bed. Pt has bilateral leg wounds and possible left leg injury. EMS placed bandages on wounds. Pt is on 4L [place] at baseline. Pt is Aox4 upon arrival. Pt has pacemaker.   EMS Vitals  BP 140/50 CBG 148

## 2024-04-03 ENCOUNTER — Inpatient Hospital Stay (HOSPITAL_COMMUNITY)

## 2024-04-03 DIAGNOSIS — I5033 Acute on chronic diastolic (congestive) heart failure: Secondary | ICD-10-CM | POA: Diagnosis not present

## 2024-04-03 LAB — BASIC METABOLIC PANEL WITH GFR
Anion gap: 10 (ref 5–15)
BUN: 42 mg/dL — ABNORMAL HIGH (ref 8–23)
CO2: 24 mmol/L (ref 22–32)
Calcium: 8.6 mg/dL — ABNORMAL LOW (ref 8.9–10.3)
Chloride: 106 mmol/L (ref 98–111)
Creatinine, Ser: 1.37 mg/dL — ABNORMAL HIGH (ref 0.44–1.00)
GFR, Estimated: 37 mL/min — ABNORMAL LOW (ref >60)
Glucose, Bld: 111 mg/dL — ABNORMAL HIGH (ref 70–99)
Potassium: 3.7 mmol/L (ref 3.5–5.1)
Sodium: 140 mmol/L (ref 135–145)

## 2024-04-03 LAB — CBC
HCT: 25.6 % — ABNORMAL LOW (ref 36.0–46.0)
Hemoglobin: 7.5 g/dL — ABNORMAL LOW (ref 12.0–15.0)
MCH: 26.2 pg (ref 26.0–34.0)
MCHC: 29.3 g/dL — ABNORMAL LOW (ref 30.0–36.0)
MCV: 89.5 fL (ref 80.0–100.0)
Platelets: 189 K/uL (ref 150–400)
RBC: 2.86 MIL/uL — ABNORMAL LOW (ref 3.87–5.11)
RDW: 21 % — ABNORMAL HIGH (ref 11.5–15.5)
WBC: 4 K/uL (ref 4.0–10.5)
nRBC: 0 % (ref 0.0–0.2)

## 2024-04-03 LAB — PHOSPHORUS: Phosphorus: 3.4 mg/dL (ref 2.5–4.6)

## 2024-04-03 LAB — MAGNESIUM: Magnesium: 1.6 mg/dL — ABNORMAL LOW (ref 1.7–2.4)

## 2024-04-03 MED ORDER — MAGNESIUM OXIDE -MG SUPPLEMENT 400 (240 MG) MG PO TABS: 400.0000 mg | ORAL_TABLET | Freq: Two times a day (BID) | ORAL | Status: DC

## 2024-04-03 MED ORDER — FUROSEMIDE 10 MG/ML IJ SOLN
80.0000 mg | Freq: Two times a day (BID) | INTRAMUSCULAR | Status: DC
  Administered 2024-04-03 – 2024-04-08 (×10): 80 mg via INTRAVENOUS
  Filled 2024-04-03 (×10): qty 8

## 2024-04-03 MED ORDER — MAGNESIUM SULFATE 2 GM/50ML IV SOLN
2.0000 g | Freq: Once | INTRAVENOUS | Status: AC
  Administered 2024-04-03: 2 g via INTRAVENOUS
  Filled 2024-04-03: qty 50

## 2024-04-03 MED ORDER — CEFAZOLIN SODIUM-DEXTROSE 2-4 GM/100ML-% IV SOLN
2.0000 g | Freq: Three times a day (TID) | INTRAVENOUS | Status: DC
  Administered 2024-04-03: 2 g via INTRAVENOUS
  Filled 2024-04-03: qty 100

## 2024-04-03 MED ORDER — CEFAZOLIN SODIUM-DEXTROSE 2-4 GM/100ML-% IV SOLN
2.0000 g | Freq: Two times a day (BID) | INTRAVENOUS | Status: DC
  Administered 2024-04-03 – 2024-04-06 (×6): 2 g via INTRAVENOUS
  Filled 2024-04-03 (×6): qty 100

## 2024-04-03 NOTE — Evaluation (Signed)
 Occupational Therapy Evaluation Patient Details Name: [name] MRN: [id number] DOB: 09-06-1933 Today's Date: 04/03/2024   History of Present Illness   Pt is a 89 y.o female admitted for fall and SOB 11/28. Admitted for LLE cellulitis with laceration and CHF exacerbation. PMH: PVD, pancreatic CA on chemotherapy, HRpEF, CKD-III, hx of nephrectomy, squamous cell carcinoma, HLD, pacemaker, COPD 2L     Clinical Impressions Pt admitted based on above, and was seen based on problem list below. PTA pt was living with her great grandson, and reports mostly ind with ADLs, but reports a recent decline. Today pt is requiring set up  to CGA for  ADLs. Functional transfers are  CGA for balance. Pt limited by decreased activity tolerance, noted pt on 6L and reporting 3/4 DOE after short activity. Pt would greatly benefit from <3 hours of skilled rehab daily. OT will continue to follow acutely to maximize functional independence.        If plan is discharge home, recommend the following:   A little help with walking and/or transfers;A little help with bathing/dressing/bathroom;Assistance with cooking/housework;Assist for transportation     Functional Status Assessment   Patient has had a recent decline in their functional status and demonstrates the ability to make significant improvements in function in a reasonable and predictable amount of time.     Equipment Recommendations   Other (comment) (Defer to next venue)      Precautions/Restrictions   Precautions Precautions: Fall Recall of Precautions/Restrictions: Impaired Restrictions Weight Bearing Restrictions Per Provider Order: No     Mobility Bed Mobility Overal bed mobility: Needs Assistance Bed Mobility: Sit to Supine       Sit to supine: Min assist   General bed mobility comments: Min assist to return LLE to bed    Transfers Overall transfer level: Needs assistance Equipment used: Rolling walker (2  wheels) Transfers: Sit to/from Stand Sit to Stand: Contact guard assist           General transfer comment: CGA for balance with RW, good handplacement to stand      Balance Overall balance assessment: Needs assistance Sitting-balance support: No upper extremity supported, Feet supported Sitting balance-Leahy Scale: Good     Standing balance support: Reliant on assistive device for balance, Single extremity supported, During functional activity Standing balance-Leahy Scale: Poor Standing balance comment: Reliant on RW       ADL either performed or assessed with clinical judgement   ADL Overall ADL's : Needs assistance/impaired Eating/Feeding: Set up;Sitting   Grooming: Contact guard assist;Standing;Wash/dry hands   Upper Body Bathing: Set up;Sitting   Lower Body Bathing: Contact guard assist;Sit to/from stand   Upper Body Dressing : Set up;Sitting   Lower Body Dressing: Contact guard assist;Sit to/from stand   Toilet Transfer: Contact guard assist;Ambulation;BSC/3in1;Rolling walker (2 wheels)   Toileting- Clothing Manipulation and Hygiene: Contact guard assist;Sit to/from stand;Sitting/lateral lean       Functional mobility during ADLs: Contact guard assist;Rolling walker (2 wheels) General ADL Comments: CGA for balance, limited by decreased activity tolerance     Vision Baseline Vision/History: 0 No visual deficits Patient Visual Report: No change from baseline Vision Assessment?: No apparent visual deficits            Pertinent Vitals/Pain Pain Assessment Pain Assessment: Faces Faces Pain Scale: Hurts little more Pain Location: LLE Pain Descriptors / Indicators: Aching, Constant Pain Intervention(s): Limited activity within patient's tolerance     Extremity/Trunk Assessment Upper Extremity Assessment Upper Extremity Assessment: Generalized weakness  Lower Extremity Assessment Lower Extremity Assessment: Defer to PT evaluation        Communication Communication Communication: No apparent difficulties   Cognition Arousal: Alert Behavior During Therapy: WFL for tasks assessed/performed Cognition: Cognition impaired       Memory impairment (select all impairments): Short-term memory   Executive functioning impairment (select all impairments): Problem solving OT - Cognition Comments: Likely baseline deficits         Following commands: Intact       Cueing  General Comments   Cueing Techniques: Verbal cues  Received on 6L, pt with inconsistent pleth, and poor reading. At end of activity O2 at 94%. Pt reporting SOB throughout           Home Living Family/patient expects to be discharged to:: Private residence Living Arrangements: Other relatives (Great grandson) Available Help at Discharge: Family;Available PRN/intermittently Type of Home: House Home Access: Stairs to enter [employer] of Steps: 2 steps Entrance Stairs-Rails: Right;Left;Can reach both Home Layout: One level     Bathroom Shower/Tub: Tub/shower unit;Sponge bathes at baseline   [employer]: Handicapped height Bathroom Accessibility: Yes How Accessible: Accessible via walker Home Equipment: Rolling Walker (2 wheels);Cane - single point;Shower seat   Additional Comments: Pt's great grandson works 24 hours shifts but is off several days in a row Psychologist, Occupational and EMT).  Pt has other family that checks in also.      Prior Functioning/Environment Prior Level of Function : Independent/Modified Independent       Mobility Comments: Using cane in house ADLs Comments: Ind with ADLs anad sponge bath    OT Problem List: Decreased strength;Decreased range of motion;Decreased activity tolerance;Impaired balance (sitting and/or standing);Decreased safety awareness;Decreased cognition;Decreased knowledge of use of DME or AE;Decreased knowledge of precautions;Cardiopulmonary status limiting activity   OT Treatment/Interventions:  Self-care/ADL training;Therapeutic exercise;Energy conservation;DME and/or AE instruction;Therapeutic activities;Patient/family education;Balance training      OT Goals(Current goals can be found in the care plan section)   Acute Rehab OT Goals Patient Stated Goal: To breathe better OT Goal Formulation: With patient Time For Goal Achievement: 04/17/24 Potential to Achieve Goals: Good   OT Frequency:  Min 2X/week       AM-PAC OT 6 Clicks Daily Activity     Outcome Measure Help from another person eating meals?: None Help from another person taking care of personal grooming?: A Little Help from another person toileting, which includes using toliet, bedpan, or urinal?: A Little Help from another person bathing (including washing, rinsing, drying)?: A Little Help from another person to put on and taking off regular upper body clothing?: A Little Help from another person to put on and taking off regular lower body clothing?: A Little 6 Click Score: 19   End of Session Nurse Communication: Mobility status  Activity Tolerance: Patient tolerated treatment well Patient left: in bed;with call bell/phone within reach;with family/visitor present  OT Visit Diagnosis: Unsteadiness on feet (R26.81);Other abnormalities of gait and mobility (R26.89);Muscle weakness (generalized) (M62.81);History of falling (Z91.81)                Time: 8661-8587 OT Time Calculation (min): 34 min Charges:  OT General Charges $OT Visit: 1 Visit OT Evaluation $OT Eval Moderate Complexity: 1 Mod OT Treatments $Self Care/Home Management : 8-22 mins  Adrianne BROCKS, OT  Acute Rehabilitation Services Office [phone] Secure chat preferred   Adrianne GORMAN Savers 04/03/2024, 3:14 PM

## 2024-04-03 NOTE — Evaluation (Signed)
 Physical Therapy Evaluation Patient Details Name: [name] MRN: [id number] DOB: 08-18-1933 Today's Date: 04/03/2024  History of Present Illness  Pt is a 89 y.o female admitted for fall and SOB 11/28. Admitted for LLE cellulitis with laceration and CHF exacerbation. PMH: PVD, pancreatic CA on chemotherapy, HRpEF, CKD-III, hx of nephrectomy, squamous cell carcinoma, HLD, pacemaker, COPD 2L  Clinical Impression  Pt admitted with above diagnosis. Previously independent, denies falls but using cane for mobility. States she recently progressed from SNF to HHPT and made excellent progress. Feels that her function has greatly declined again. Pt required min assist for all mobility (Bed, transfer, gait) and is reliant on RW for support. Patient will benefit from continued inpatient follow up therapy, <3 hours/day. VSS throughout visit on 2L with mobility. Pt currently with functional limitations due to the deficits listed below (see PT Problem List). Pt will benefit from acute skilled PT to increase their independence and safety with mobility to allow discharge.           If plan is discharge home, recommend the following: A little help with walking and/or transfers;A little help with bathing/dressing/bathroom;Assistance with cooking/housework;Assist for transportation;Help with stairs or ramp for entrance   Can travel by private vehicle   Yes    Equipment Recommendations None recommended by PT  Recommendations for Other Services       Functional Status Assessment Patient has had a recent decline in their functional status and demonstrates the ability to make significant improvements in function in a reasonable and predictable amount of time.     Precautions / Restrictions Precautions Precautions: Fall Recall of Precautions/Restrictions: Impaired Restrictions Weight Bearing Restrictions Per Provider Order: No      Mobility  Bed Mobility Overal bed mobility: Needs Assistance Bed  Mobility: Rolling, Sidelying to Sit Rolling: Min assist Sidelying to sit: Min assist, Used rails, HOB elevated       General bed mobility comments: Min assist to roll and for trunk support to rise, using rails throughout with VC for technique. Effortful for pt.    Transfers Overall transfer level: Needs assistance   Transfers: Sit to/from Stand Sit to Stand: Min assist           General transfer comment: Min assist for boost to stand and balance x2 from edge of bed. Slow and effortful with posterior lean until  assisted with forward weight shift and UE support on RW. Cues for hand placement and guidance to descent into seated positions.    Ambulation/Gait Ambulation/Gait assistance: Min assist Gait Distance (Feet): 25 Feet Assistive device: Rolling walker (2 wheels) Gait Pattern/deviations: Step-to pattern, Step-through pattern, Decreased stance time - left, Decreased stride length, Antalgic, Trunk flexed, Shuffle Gait velocity: dec Gait velocity interpretation: <1.31 ft/sec, indicative of household ambulator   General Gait Details: Min assist for light balance and RW initially, gradually progressing to CGA as distance progressed. Good follow through with cues for step-through pattern. Moderately antalgic, reliant on RW for support. SpO2 100% on 2L. with moderate dyspnea at times.  Stairs            Wheelchair Mobility     Tilt Bed    Modified Rankin (Stroke Patients Only)       Balance Overall balance assessment: Needs assistance Sitting-balance support: No upper extremity supported, Feet supported Sitting balance-Leahy Scale: Good     Standing balance support: Reliant on assistive device for balance, Single extremity supported, During functional activity Standing balance-Leahy Scale: Poor  Pertinent Vitals/Pain Pain Assessment Pain Assessment: Faces Faces Pain Scale: Hurts even more Pain Location: LLE Pain  Descriptors / Indicators: Aching, Constant Pain Intervention(s): Limited activity within patient's tolerance, Monitored during session, Repositioned    Home Living Family/patient expects to be discharged to:: Private residence Living Arrangements: Other relatives (Great grandson - Curator) Available Help at Discharge: Family;Available PRN/intermittently Type of Home: House Home Access: Stairs to enter Entrance Stairs-Rails: Right;Left;Can reach both Entrance Stairs-Number of Steps: 2 steps with B railings from back entrance; 2 steps with L railing from front entrance   Home Layout: One level Home Equipment: Agricultural Consultant (2 wheels);Cane - single point;Shower seat Additional Comments: Pt's great grandson works 24 hours shifts but is off several days in a row Psychologist, Occupational and EMT).  Pt has other family that checks in also.    Prior Function Prior Level of Function : Independent/Modified Independent             Mobility Comments: Using cane in house ADLs Comments: Pt states that she is IND with basic ADLs, able to sponge bathe herself     Extremity/Trunk Assessment   Upper Extremity Assessment Upper Extremity Assessment: Defer to OT evaluation    Lower Extremity Assessment Lower Extremity Assessment: LLE deficits/detail LLE Deficits / Details: large skin tear LLE lateral to shin, sutured, no overt drainage at time of visit. LLE: Unable to fully assess due to pain       Communication   Communication Communication: No apparent difficulties    Cognition Arousal: Alert Behavior During Therapy: WFL for tasks assessed/performed   PT - Cognitive impairments: No apparent impairments                         Following commands: Intact       Cueing Cueing Techniques: Verbal cues     General Comments General comments (skin integrity, edema, etc.): SpO2 100% on 2L. HR 60s, BP 114/65 with reported dizziness at times.    Exercises General Exercises - Lower  Extremity Ankle Circles/Pumps: AROM, Both, 10 reps, Seated   Assessment/Plan    PT Assessment Patient needs continued PT services  PT Problem List Decreased strength;Decreased range of motion;Decreased activity tolerance;Decreased balance;Decreased mobility;Decreased knowledge of use of DME;Pain;Decreased skin integrity       PT Treatment Interventions DME instruction;Gait training;Stair training;Functional mobility training;Therapeutic activities;Therapeutic exercise;Balance training;Neuromuscular re-education;Patient/family education    PT Goals (Current goals can be found in the Care Plan section)  Acute Rehab PT Goals Patient Stated Goal: Get well, reduce pain PT Goal Formulation: With patient Time For Goal Achievement: 04/17/24 Potential to Achieve Goals: Good    Frequency Min 2X/week     Co-evaluation               AM-PAC PT 6 Clicks Mobility  Outcome Measure Help needed turning from your back to your side while in a flat bed without using bedrails?: A Little Help needed moving from lying on your back to sitting on the side of a flat bed without using bedrails?: A Little Help needed moving to and from a bed to a chair (including a wheelchair)?: A Little Help needed standing up from a chair using your arms (e.g., wheelchair or bedside chair)?: A Little Help needed to walk in hospital room?: A Little Help needed climbing 3-5 steps with a railing? : A Lot 6 Click Score: 17    End of Session Equipment Utilized During Treatment: Oxygen  Activity Tolerance: Patient tolerated treatment  well Patient left: in chair;with call bell/phone within reach;with chair alarm set Nurse Communication: Mobility status PT Visit Diagnosis: Unsteadiness on feet (R26.81);Other abnormalities of gait and mobility (R26.89);Muscle weakness (generalized) (M62.81);History of falling (Z91.81);Difficulty in walking, not elsewhere classified (R26.2);Pain Pain - Right/Left: Left Pain - part of  body: Leg    Time: 9144-9051 PT Time Calculation (min) (ACUTE ONLY): 53 min   Charges:   PT Evaluation $PT Eval Moderate Complexity: 1 Mod PT Treatments $Gait Training: 8-22 mins $Therapeutic Activity: 8-22 mins PT General Charges $$ ACUTE PT VISIT: 1 Visit         Leontine Roads, PT, DPT [facility]  Rehabilitation Services Physical Therapist Office: [phone] Website: [email or web address]   Leontine GORMAN Roads 04/03/2024, 11:23 AM

## 2024-04-03 NOTE — Plan of Care (Signed)
  Problem: Clinical Measurements: Goal: Ability to avoid or minimize complications of infection will improve Outcome: Progressing   Problem: Skin Integrity: Goal: Skin integrity will improve Outcome: Progressing   Problem: Education: Goal: Knowledge of General Education information will improve Description: Including pain rating scale, medication(s)/side effects and non-pharmacologic comfort measures Outcome: Progressing   Problem: Health Behavior/Discharge Planning: Goal: Ability to manage health-related needs will improve Outcome: Progressing   Problem: Clinical Measurements: Goal: Ability to maintain clinical measurements within normal limits will improve Outcome: Progressing Goal: Will remain free from infection Outcome: Progressing Goal: Diagnostic test results will improve Outcome: Progressing Goal: Respiratory complications will improve Outcome: Progressing Goal: Cardiovascular complication will be avoided Outcome: Progressing   Problem: Activity: Goal: Risk for activity intolerance will decrease Outcome: Progressing   Problem: Nutrition: Goal: Adequate nutrition will be maintained Outcome: Progressing   Problem: Coping: Goal: Level of anxiety will decrease Outcome: Progressing   Problem: Elimination: Goal: Will not experience complications related to bowel motility Outcome: Progressing Goal: Will not experience complications related to urinary retention Outcome: Progressing   Problem: Pain Managment: Goal: General experience of comfort will improve and/or be controlled Outcome: Progressing   Problem: Safety: Goal: Ability to remain free from injury will improve Outcome: Progressing   Problem: Skin Integrity: Goal: Risk for impaired skin integrity will decrease Outcome: Progressing   Problem: Education: Goal: Knowledge of General Education information will improve Description: Including pain rating scale, medication(s)/side effects and  non-pharmacologic comfort measures Outcome: Progressing   Problem: Health Behavior/Discharge Planning: Goal: Ability to manage health-related needs will improve Outcome: Progressing   Problem: Clinical Measurements: Goal: Ability to maintain clinical measurements within normal limits will improve Outcome: Progressing Goal: Will remain free from infection Outcome: Progressing Goal: Diagnostic test results will improve Outcome: Progressing Goal: Respiratory complications will improve Outcome: Progressing Goal: Cardiovascular complication will be avoided Outcome: Progressing   Problem: Activity: Goal: Risk for activity intolerance will decrease Outcome: Progressing   Problem: Nutrition: Goal: Adequate nutrition will be maintained Outcome: Progressing   Problem: Coping: Goal: Level of anxiety will decrease Outcome: Progressing   Problem: Elimination: Goal: Will not experience complications related to bowel motility Outcome: Progressing Goal: Will not experience complications related to urinary retention Outcome: Progressing   Problem: Pain Managment: Goal: General experience of comfort will improve and/or be controlled Outcome: Progressing   Problem: Safety: Goal: Ability to remain free from injury will improve Outcome: Progressing   Problem: Skin Integrity: Goal: Risk for impaired skin integrity will decrease Outcome: Progressing

## 2024-04-03 NOTE — Progress Notes (Signed)
 PROGRESS NOTE  [name] FMW:[id number] DOB: Apr 26, 1934 DOA: 04/02/2024 PCP: [name], MD   LOS: 1 day   Brief narrative:   Patient is a 89 years old female with past medical history of  COPD on home oxygen  at 2 L/min, congestive heart failure and chronic left lower extremity wound, history of defibrillator and atrial fibrillation not on anticoagulation presented to hospital after sustaining a fall at home.  Patient stated that she rolled from her bed.  Normally uses a walker for ambulation at home and lives with her family.  She also complained of worsening shortness of breath and dyspnea especially on exertion and lying down for the last few days.   In the ED, vitals with notable for blood pressure of 131/56 and was afebrile.  Initial labs were notable for hypokalemia with potassium of 3.2.  Creatinine of 1.5.  Hemoglobin of 9.2.  BNP was elevated at 354.  COVID influenza and RSV was negative.  Chest x-ray showed mild pulmonary edema with diffuse interstitial markings.  Patient was then considered for admission to the hospital for left leg cellulitis with laceration and CHF exacerbation   Assessment/Plan: Active Problems:   Hyperlipidemia   Atrial fibrillation, permanent (HCC)   COPD (chronic obstructive pulmonary disease) (HCC)   CKD stage 3b, GFR 30-44 ml/min (HCC)   ICD (implantable cardioverter-defibrillator) in place   Hypertension   Valvular heart disease   Cellulitis   CHF exacerbation (HCC)  Acute on chronic diastolic CHF exacerbation.   History of pulmonary hypertension not a good candidate for pulmonary vasodilators as per advanced heart failure team.  Continue IV Lasix .  Strict intake and output charting Daily weights.  Was seen by Dr. Venancio advanced heart failure team as outpatient.  Review of previous 2 D Echocardiogram from 09/06/2023 with last known ejection fraction of left ventricular ejection fraction of 60 to 65% with mild LVH.  Check 2D echocardiogram.   Currently patient is on 40 mg IV Lasix  twice daily.  Patient is negative balance for 250 mL.  Will increase the dose of Lasix  to 80 twice daily.  Left lower extremity laceration with mild cellulitis on the background of chronic lower extremity wounds..  Needed suturing in the ED with some blood loss.  Has some cellulitis around the leg so on antibiotics.  No leukocytosis or fever.  Hypomagnesemia.  Magnesium  1.6 today.  Will replenish with 2 g of IV magnesium  sulfate.  Hypokalemia.  Improved after replacement.  Continue to monitor closely   CKD stage IV.  Continue to monitor closely.  Initial creatinine of 1.5.  Creatinine today at 1.3.  Will closely monitor while on diuretics.   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  Has had transfusions and IV iron  in the past.  Off anticoagulation at this time.  Will continue to monitor hemoglobin levels.  Latest hemoglobin of 7.5 from initial 8.1.  Will continue to monitor.   History of atrial fibrillation.  Permanent.  Has failed amiodarone  Tikosyn  and flecainide  in the past and had multiple cardioversions.  Currently on pacemaker.  Not a good anticoagulation candidate.  Rate is controlled at this time.   Valvular heart disease.  Has severe tricuspid regurgitation and moderate mitral and aortic stenosis.  Not a surgical candidate.  Seen by advanced heart failure team as outpatient.    COPD with chronic hypoxic respiratory failure.  Patient is on 2 L of oxygen  at baseline at home.  Continue nebulizers, incentive spirometer.  Hypertension.  Will closely monitor blood pressure.     History of neuroendocrine tumor on Sandostatin .  Follows up with oncology as outpatient.   Deconditioning debility, fall.  Will get PT OT evaluation.  Patient currently lives at her house and great grandson helps her out.  Was at the skilled nursing facility in the past.   DVT prophylaxis: heparin  injection 5,000 Units Start: 04/02/24 2200   Disposition:  Uncertain at this time.  Will get PT OT evaluation  Status is: Inpatient Remains inpatient appropriate because: Pending clinical improvement, possible need for rehabilitation,    Code Status:     Code Status: Limited: Do not attempt resuscitation (DNR) -DNR-LIMITED -Do Not Intubate/DNI   Family Communication: None at bedside.  Consultants: None  Procedures: None  Anti-infectives:  Cefazolin  IV  Anti-infectives (From admission, onward)    Start     Dose/Rate Route Frequency Ordered Stop   04/04/24 1600  vancomycin  (VANCOCIN ) IVPB 1000 mg/200 mL premix  Status:  Discontinued        1,000 mg 200 mL/hr over 60 Minutes Intravenous Every 48 hours 04/02/24 1824 04/03/24 0846   04/03/24 1000  cefTRIAXone  (ROCEPHIN ) 1 g in sodium chloride  0.9 % 100 mL IVPB  Status:  Discontinued        1 g 200 mL/hr over 30 Minutes Intravenous Every 24 hours 04/02/24 1632 04/03/24 0846   04/03/24 0945  ceFAZolin  (ANCEF ) IVPB 2g/100 mL premix        2 g 200 mL/hr over 30 Minutes Intravenous Every 8 hours 04/03/24 0846 04/10/24 0559   04/02/24 1645  vancomycin  (VANCOCIN ) IVPB 1000 mg/200 mL premix  Status:  Discontinued        1,000 mg 200 mL/hr over 60 Minutes Intravenous  Once 04/02/24 1632 04/02/24 1633   04/02/24 1400  vancomycin  (VANCOCIN ) IVPB 1000 mg/200 mL premix        1,000 mg 200 mL/hr over 60 Minutes Intravenous  Once 04/02/24 1358 04/02/24 1814   04/02/24 1400  cefTRIAXone  (ROCEPHIN ) 1 g in sodium chloride  0.9 % 100 mL IVPB        1 g 200 mL/hr over 30 Minutes Intravenous  Once 04/02/24 1358 04/02/24 1610        Subjective: Today, patient was seen and examined at bedside.  Patient states that she was able to rest some yesterday.  Still on a recliner.  Has been having diuresis.  Denies any cough chest pain fever chills.  Denies obvious dyspnea today.  Still has some shortness of breath.  On nasal cannula oxygen .  Objective: Vitals:   04/03/24 0318 04/03/24 0801  BP: (!) 111/48  (!) 116/43  Pulse: 60 60  Resp: 16 17  Temp: 97.9 F (36.6 C) 97.9 F (36.6 C)  SpO2: 100% 100%    Intake/Output Summary (Last 24 hours) at 04/03/2024 1009 Last data filed at 04/03/2024 9365 Gross per 24 hour  Intake 450 ml  Output 700 ml  Net -250 ml   There were no vitals filed for this visit. There is no height or weight on file to calculate BMI.   Physical Exam:  GENERAL: Patient is alert awake and oriented. Not in obvious distress.  Elderly female, alert awake and Communicative.,  On nasal cannula oxygen  HENT: No scleral pallor or icterus. Pupils equally reactive to light. Oral mucosa is moist NECK: is supple, no gross swelling noted. CHEST: Diminished breath sounds bilaterally. CVS: S1 and S2 heard, no murmur. Regular rate and rhythm.  ABDOMEN: Soft,  non-tender, bowel sounds are present. EXTREMITIES: Bilateral lower extremity chronic venous insufficiency, left leg with laceration status post discharge on dressing. CNS: Cranial nerves are intact. No focal motor deficits. SKIN: warm and dry without rashes.  Bilateral lower extremity venous insufficiency with edema and left leg with laceration  Data Review: I have personally reviewed the following laboratory data and studies,  CBC: Recent Labs  Lab 04/02/24 1537 04/03/24 0340  WBC 5.0 4.0  NEUTROABS 3.6  --   HGB 8.1*  9.2* 7.5*  HCT 27.2*  27.0* 25.6*  MCV 90.1 89.5  PLT 184 189   Basic Metabolic Panel: Recent Labs  Lab 04/02/24 1537 04/03/24 0340  NA 139  141 140  K 3.2*  3.2* 3.7  CL 107  104 106  CO2 23 24  GLUCOSE 103*  99 111*  BUN 43*  41* 42*  CREATININE 1.27*  1.50* 1.37*  CALCIUM  8.6* 8.6*  MG  --  1.6*  PHOS  --  3.4   Liver Function Tests: Recent Labs  Lab 04/02/24 1537  AST 35  ALT 19  ALKPHOS 62  BILITOT 0.8  PROT 6.0*  ALBUMIN  3.2*   No results for input(s): LIPASE, AMYLASE in the last 168 hours. No results for input(s): AMMONIA in the last 168 hours. Cardiac  Enzymes: Recent Labs  Lab 04/02/24 1537  CKTOTAL 351*   BNP (last 3 results) Recent Labs    06/24/23 1621 07/10/23 1641 04/02/24 1537  BNP 516.7* 322.2* 354.1*    ProBNP (last 3 results) No results for input(s): PROBNP in the last 8760 hours.  CBG: No results for input(s): GLUCAP in the last 168 hours. Recent Results (from the past 240 hours)  Resp panel by RT-PCR (RSV, Flu A&B, Covid) Anterior Nasal Swab     Status: None   Collection Time: 04/02/24  1:57 PM   Specimen: Anterior Nasal Swab  Result Value Ref Range Status   SARS Coronavirus 2 by RT PCR NEGATIVE NEGATIVE Final   Influenza A by PCR NEGATIVE NEGATIVE Final   Influenza B by PCR NEGATIVE NEGATIVE Final    Comment: (NOTE) The Xpert Xpress SARS-CoV-2/FLU/RSV plus assay is intended as an aid in the diagnosis of influenza from Nasopharyngeal swab specimens and should not be used as a sole basis for treatment. Nasal washings and aspirates are unacceptable for Xpert Xpress SARS-CoV-2/FLU/RSV testing.  Fact Sheet for Patients: bloggercourse.com  Fact Sheet for Healthcare Providers: seriousbroker.it  This test is not yet approved or cleared by the [place]  FDA and has been authorized for detection and/or diagnosis of SARS-CoV-2 by FDA under an Emergency Use Authorization (EUA). This EUA will remain in effect (meaning this test can be used) for the duration of the COVID-19 declaration under Section 564(b)(1) of the Act, 21 U.S.C. section 360bbb-3(b)(1), unless the authorization is terminated or revoked.     Resp Syncytial Virus by PCR NEGATIVE NEGATIVE Final    Comment: (NOTE) Fact Sheet for Patients: bloggercourse.com  Fact Sheet for Healthcare Providers: seriousbroker.it  This test is not yet approved or cleared by the [place]  FDA and has been authorized for detection and/or diagnosis of  SARS-CoV-2 by FDA under an Emergency Use Authorization (EUA). This EUA will remain in effect (meaning this test can be used) for the duration of the COVID-19 declaration under Section 564(b)(1) of the Act, 21 U.S.C. section 360bbb-3(b)(1), unless the authorization is terminated or revoked.  Performed at [facility], [address] [place], [place] [address]  Studies: DG Chest Portable 1 View Result Date: 04/02/2024 EXAM: 1 VIEW(S) XRAY OF THE CHEST 04/02/2024 02:47:00 PM COMPARISON: 09/03/2023 CLINICAL HISTORY: sob FINDINGS: LINES, TUBES AND DEVICES: AICD leads are unchanged and appear in appropriate position. LUNGS AND PLEURA: Mild pulmonary edema with diffuse accentuated interstitial markings. Blunting of the costophrenic angles favoring pleural effusions, right greater than left. No pneumothorax. HEART AND MEDIASTINUM: Cardiomegaly. Calcified aorta. AICD leads are unchanged and appear in appropriate position. BONES AND SOFT TISSUES: Stable benign appearing sclerotic lesion proximal left humerus. No acute osseous abnormality. IMPRESSION: 1. Mild pulmonary edema with diffuse accentuated interstitial markings. 2. Blunting of the costophrenic angles favoring pleural effusions, right greater than left. 3. Cardiomegaly with unchanged AICD leads. Electronically signed by: Ryan Salvage MD 04/02/2024 03:23 PM EST RP Workstation: [id number]   DG Tibia/Fibula Left Port Result Date: 04/02/2024 CLINICAL DATA:  Fall EXAM: PORTABLE LEFT TIBIA AND FIBULA - 2 VIEW COMPARISON:  None Available. FINDINGS: There is soft tissue laceration or ulceration of the anterior lower extremity. There is no underlying periosteal reaction, fracture or dislocation. The bones are diffusely osteopenic. There is diffuse soft tissue swelling of the lower extremity. IMPRESSION: Soft tissue laceration or ulceration of the anterior lower extremity. No underlying periosteal reaction, fracture or dislocation.  Electronically Signed   By: Greig Pique M.D.   On: 04/02/2024 15:16      Vernal Alstrom, MD  [facility] 04/03/2024  If 7PM-7AM, please contact night-coverage

## 2024-04-04 ENCOUNTER — Inpatient Hospital Stay (HOSPITAL_COMMUNITY)

## 2024-04-04 DIAGNOSIS — I342 Nonrheumatic mitral (valve) stenosis: Secondary | ICD-10-CM

## 2024-04-04 DIAGNOSIS — I50813 Acute on chronic right heart failure: Secondary | ICD-10-CM

## 2024-04-04 DIAGNOSIS — I272 Pulmonary hypertension, unspecified: Secondary | ICD-10-CM

## 2024-04-04 DIAGNOSIS — I5033 Acute on chronic diastolic (congestive) heart failure: Secondary | ICD-10-CM | POA: Diagnosis not present

## 2024-04-04 DIAGNOSIS — I350 Nonrheumatic aortic (valve) stenosis: Secondary | ICD-10-CM | POA: Diagnosis not present

## 2024-04-04 DIAGNOSIS — I4821 Permanent atrial fibrillation: Secondary | ICD-10-CM

## 2024-04-04 DIAGNOSIS — Z95810 Presence of automatic (implantable) cardiac defibrillator: Secondary | ICD-10-CM

## 2024-04-04 LAB — CBC
HCT: 25.4 % — ABNORMAL LOW (ref 36.0–46.0)
Hemoglobin: 7.6 g/dL — ABNORMAL LOW (ref 12.0–15.0)
MCH: 26.6 pg (ref 26.0–34.0)
MCHC: 29.9 g/dL — ABNORMAL LOW (ref 30.0–36.0)
MCV: 88.8 fL (ref 80.0–100.0)
Platelets: 214 K/uL (ref 150–400)
RBC: 2.86 MIL/uL — ABNORMAL LOW (ref 3.87–5.11)
RDW: 20.9 % — ABNORMAL HIGH (ref 11.5–15.5)
WBC: 5.5 K/uL (ref 4.0–10.5)
nRBC: 0.4 % — ABNORMAL HIGH (ref 0.0–0.2)

## 2024-04-04 LAB — BASIC METABOLIC PANEL WITH GFR
Anion gap: 10 (ref 5–15)
BUN: 39 mg/dL — ABNORMAL HIGH (ref 8–23)
CO2: 24 mmol/L (ref 22–32)
Calcium: 8.8 mg/dL — ABNORMAL LOW (ref 8.9–10.3)
Chloride: 101 mmol/L (ref 98–111)
Creatinine, Ser: 1.45 mg/dL — ABNORMAL HIGH (ref 0.44–1.00)
GFR, Estimated: 35 mL/min — ABNORMAL LOW (ref >60)
Glucose, Bld: 109 mg/dL — ABNORMAL HIGH (ref 70–99)
Potassium: 4.9 mmol/L (ref 3.5–5.1)
Sodium: 135 mmol/L (ref 135–145)

## 2024-04-04 LAB — ECHOCARDIOGRAM COMPLETE
AR max vel: 1.24 cm2
AV Area VTI: 1.18 cm2
AV Area mean vel: 1.18 cm2
AV Mean grad: 20.5 mmHg
AV Peak grad: 36.1 mmHg
Ao pk vel: 3.01 m/s
Area-P 1/2: 4.6 cm2
Calc EF: 49.3 %
MV VTI: 1.56 cm2
S' Lateral: 2.8 cm
Single Plane A2C EF: 80.8 %
Single Plane A4C EF: 15.3 %
Weight: 2211.65 [oz_av]

## 2024-04-04 LAB — MAGNESIUM: Magnesium: 1.9 mg/dL (ref 1.7–2.4)

## 2024-04-04 NOTE — Discharge Instructions (Signed)
 [facility]ctions   [facility] (Adult Program)  - Address: [address] [place], [place] [address] - General office #:  [phone] - Enrollment Phone #: [phone]  [facility]  - Senior Friendship Line: call toll free, available 24 hours a day, at [phone]   [place] [address]  [place] 2-1-1 is another useful way to locate resources in the community. Visit ShedSizes.ch to find service information online. If you need additional assistance, 2-1-1 Referral Specialists are available 24 hours a day, every day by dialing 2-1-1 or [phone] from any phone. The call is free, confidential, and available in any language.   [facility] [place]: [phone] / [address], [place], [place] [address]  Dial 988: Talk lifeline 24/7.  [employer] Warmline: [phone]

## 2024-04-04 NOTE — Progress Notes (Signed)
  Echocardiogram 2D Echocardiogram has been performed.  [name] 04/04/2024, 10:06 AM

## 2024-04-04 NOTE — NC FL2 (Signed)
 [place]  MEDICAID FL2 LEVEL OF CARE FORM     IDENTIFICATION  Patient Name: [name] Birthdate: 1933-06-07 Sex: female Admission Date (Current Location): 04/02/2024  [facility] and [place] Number:  Producer, Television/film/video and Address:  [facility], [address] [place], [place] [address]      Provider Number: [id number]  Attending Physician Name and Address:  Sonjia Held, MD  Relative Name and Phone Number:  Breona Cherubin; Daughter; [phone]    Current Level of Care: Hospital Recommended Level of Care: Skilled Nursing Facility Prior Approval Number:    Date Approved/Denied:   PASRR Number: [id number]  Discharge Plan: SNF    Current Diagnoses: Patient Active Problem List   Diagnosis Date Noted   Aortic valve stenosis 04/04/2024   Nonrheumatic mitral valve stenosis 04/04/2024   Acute on chronic right-sided heart failure (HCC) 04/04/2024   CHF exacerbation (HCC) 04/02/2024   Protein-calorie malnutrition, severe 01/14/2024   MRSA (methicillin resistant staph aureus) culture positive 01/12/2024   History of bacteremia 09/16/2023   Pulmonary hypertension due to COPD (HCC) 09/06/2023   Sepsis due to cellulitis (HCC) 09/03/2023   Presence of heart assist device (HCC) 08/30/2023   Medication management 08/29/2023   Cellulitis 08/26/2023   FTT (failure to thrive) in adult 06/24/2023   Venous stasis ulcer of left lower leg with edema of left lower leg (HCC) 06/18/2023   (HFpEF) heart failure with preserved ejection fraction (HCC)    Renal cell carcinoma s/p nephrectomy 2003(HCC) 11/25/2022   Herpes zoster 09/02/2022   Decreased hearing of right ear 07/25/2022   DNR (do not resuscitate) 07/25/2022   Bilateral impacted cerumen 05/20/2022   Chronic hypoxic respiratory failure, on home oxygen  therapy (HCC) 04/03/2022   Primary pancreatic neuroendocrine tumor (HCC) 03/20/2022   Osteoporosis 10/11/2021   Squamous cell carcinoma of foot,  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   Chronic gout due to renal impairment 01/30/2021   History of blood transfusion reaction 04/17/2020   Angiodysplasia of small intestine    Gastric polyps    GI bleed 03/28/2020   Palliative care by specialist    Advanced directives, counseling/discussion    Normocytic anemia 01/06/2020   History of gastritis 11/11/2019   Chronic venous hypertension w ulceration (HCC)    Black stools    Chronic bilateral pleural effusions 11/04/2018   Iliac artery stenosis, bilateral 07/14/2017   Chronic blood loss anemia 11/18/2016   Severe pulmonary hypertension (HCC) 12/04/2015   B12 deficiency 05/22/2015   PAD (peripheral artery disease)    History of nephrectomy secondary to RCC    TIA (transient ischemic attack) 08/16/2014   Other fatigue 01/14/2014   Chronic diastolic CHF (congestive heart failure) (HCC) 06/04/2013   Valvular heart disease 06/04/2013   Allergic rhinitis 01/01/2013   Chronic cholecystitis with calculus 09/15/2012   Claudication 01/21/2012   Hypertension 05/20/2011   HOCM/IHSS 07/14/2009   Hyperlipidemia 04/10/2009   INCONTINENCE, URGE 04/10/2009   NICM (nonischemic cardiomyopathy) (HCC) 10/27/2008   Atrial fibrillation, permanent (HCC) 10/27/2008   SINUS BRADYCARDIA 10/27/2008   Carotid artery stenosis 10/27/2008   COPD (chronic obstructive pulmonary disease) (HCC) 10/27/2008   CKD stage 3b, GFR 30-44 ml/min (HCC) 10/27/2008   ICD (implantable cardioverter-defibrillator) in place 10/27/2008    Orientation RESPIRATION BLADDER Height & Weight     Self, Time, Situation, Place  O2 (4L Nasal Cannula) Continent, External catheter Weight: 138 lb 3.7 oz (62.7 kg) Height:     BEHAVIORAL SYMPTOMS/MOOD NEUROLOGICAL BOWEL NUTRITION STATUS  Continent Diet (Please see discharge summary)  AMBULATORY STATUS COMMUNICATION OF NEEDS Skin   Limited Assist Verbally Other (Comment) (Wound Traumatic Leg Left;Lateral)                       Personal Care  Assistance Level of Assistance  Bathing, Dressing, Feeding Bathing Assistance: Limited assistance Feeding assistance: Limited assistance Dressing Assistance: Limited assistance     Functional Limitations Info             SPECIAL CARE FACTORS FREQUENCY  PT (By licensed PT), OT (By licensed OT)     PT Frequency: 5x OT Frequency: 5x            Contractures Contractures Info: Not present    Additional Factors Info  Code Status, Allergies Code Status Info: DNR-LIMITED -Do Not Intubate/DNI Allergies Info: Codeine, Dilaudid  (Hydromorphone ), Tape, Amoxil  (Amoxicillin ), Asa (Aspirin ), Ms Contin  (Morphine ), Neurontin  (Gabapentin ), Nsaids           Current Medications (04/04/2024):  This is the current hospital active medication list Current Facility-Administered Medications  Medication Dose Route Frequency Provider Last Rate Last Admin   acetaminophen  (TYLENOL ) tablet 650 mg  650 mg Oral Q4H PRN Pokhrel, Laxman, MD   650 mg at 04/03/24 1945   ceFAZolin  (ANCEF ) IVPB 2g/100 mL premix  2 g Intravenous Q12H Pokhrel, Laxman, MD 200 mL/hr at 04/04/24 1020 2 g at 04/04/24 1020   fluticasone  (FLONASE ) 50 MCG/ACT nasal spray 2 spray  2 spray Each Nare Daily PRN Pokhrel, Laxman, MD       furosemide  (LASIX ) injection 80 mg  80 mg Intravenous BID Pokhrel, Laxman, MD   80 mg at 04/04/24 9078   heparin  injection 5,000 Units  5,000 Units Subcutaneous Q8H Pokhrel, Laxman, MD   5,000 Units at 04/04/24 9376   ipratropium-albuterol  (DUONEB) 0.5-2.5 (3) MG/3ML nebulizer solution 3 mL  3 mL Nebulization Q4H PRN Pokhrel, Laxman, MD       lidocaine -EPINEPHrine -tetracaine (LET) topical gel  3 mL Topical Once Yolande Lamar BROCKS, MD       metolazone  (ZAROXOLYN ) tablet 2.5 mg  2.5 mg Oral Once per day on Tuesday Thursday Saturday Marten Larraine HERO, RPH   2.5 mg at 04/03/24 1015   And   potassium chloride  SA (KLOR-CON  M) CR tablet 40 mEq  40 mEq Oral Once per day on Tuesday Thursday Saturday Marten Larraine HERO,  [place]   40 mEq at 04/03/24 1029   ondansetron  (ZOFRAN ) injection 4 mg  4 mg Intravenous Q6H PRN Pokhrel, Laxman, MD       polyethylene glycol (MIRALAX  / GLYCOLAX ) packet 17 g  17 g Oral Daily Pokhrel, Laxman, MD       potassium chloride  SA (KLOR-CON  M) CR tablet 40 mEq  40 mEq Oral Daily Pokhrel, Laxman, MD   40 mEq at 04/03/24 1534   rosuvastatin  (CRESTOR ) tablet 10 mg  10 mg Oral Daily Pokhrel, Laxman, MD   10 mg at 04/04/24 9078   sodium chloride  flush (NS) 0.9 % injection 3 mL  3 mL Intravenous Q12H Pokhrel, Laxman, MD   3 mL at 04/04/24 9095   sodium chloride  flush (NS) 0.9 % injection 3 mL  3 mL Intravenous PRN Pokhrel, Laxman, MD       traMADol  (ULTRAM ) tablet 50 mg  50 mg Oral TID PRN Pokhrel, Laxman, MD   50 mg at 04/03/24 1944     Discharge Medications: Please see discharge summary for a list of discharge medications.  Relevant  Imaging Results:  Relevant Lab Results:   Additional Information SSN: [id number]  Lauraine FORBES Saa, LCSWA

## 2024-04-04 NOTE — Consult Note (Signed)
 Cardiology Consultation   Patient ID: JERALDEAN WECHTER MRN: [id number]; DOB: 09/24/1933  Admit date: 04/02/2024 Date of Consult: 04/04/2024  PCP:  [name], MD   [facility] Providers Cardiologist:  Evalene Lunger, MD  Electrophysiologist:  Elspeth Sage, MD (Inactive)  Advanced Heart Failure:  Ezra Shuck, MD     Patient Profile: [name] is a 89 y.o. 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 Sonjia.  History of Present Illness: [name] is an 89 year old female with above medical history who is followed by Dr. Shuck with advanced heart failure  Patient has extensive cardiac history for which she has seen advanced heart failure, EP, and general cardiology for in the past. Also has a history of pancreatic neuroendocrine tumor and chronic blood loss anemia that are followed by heme-onc. Felt that her chronic anemia is likely related to her history of GI bleeds from AVMs.   She has permanent atrial fibrillation. Previously failed treatments with amiodarone , tikosyn , flecainide . Has had multiple cardioversions in the past. Rate controlled without use of AV nodal blockers. Not on anticoagulation due to chronic anemia as above. Not felt to be a candidate for watchman due to failty and poorly compensated CHF.    Patient's diastolic heart failure has been managed with torsemide  100 mg BID, metolazone  2.5 mg three times weekly. Was previously on SGLT2i, but stopped due to UTIs. She has known pulmonary HTN based on RHC from 2020. Primarily pulmonary venous HTN and she is not a candidate for pulmonary vasodilators. Most  recent echocardiogram from 09/2023 showed EF 60-65%, no regional wall motion abnormalities, mild LVH, moderately reduced RV systolic function, severely elevated PA systolic pressure, moderate LA and severe RA dilation, mild MR, moderate MS, severe TR. Has an ICD-Medtronic and optivol.   Patient was admitted to [facility] from 9/5-9/22/25. She had presented with 2 lower extremity ulcers that were poor healing. She had been treated with antibiotics. Cultures grew MRSA and corynebacterium. She was sent to the hospital for IV antibiotics. While admitted, patient was seen by vascular surgery who felt that debridement would be appropriate. However, oncology stated that patient would not be able to do debridement for 4-6 weeks as she had been on Avastin . Treated with a 10 day course of linezolid . She was given IV antipsychotics for visual disturbances and agitation, and she was not eating or drinking much. Her diuretics were held due to this and bump in creatinine. Cardiology was consulted. She was ultimately discharged on torsemide  100 mg daily. Was not able to restart metolazone  at DC. Discharged to SNF   Patient called the cardiology clinic on 11/25 with concerns about fluid retention. Her torsemide  was increased back to 100 mg BID and she restarted metolazone  1.5 mg three times per week.   Patient presented to the ED on 11/28. She was brought in by EMS after family found patient on floor next to her bed. She also reported shortness of breath, orthopnea, increased lower extremity swelling. She was started on IV antibiotics for lower extremity cellulitis. Admitted to the internal medicine service. She was started on IV lasix . Cardiology consulted for further treatment of CHF.   On interview, patient tells  me that her shortness of breath and leg swelling have significantly improved since she started IV lasix . Breathing feels back to normal. She is on 4 L supplemental oxygen  at home, and is currently back to baseline. She  continues to have mild lower extremity swelling. Reports good urine output.   Past Medical History:  Diagnosis Date   (HFpEF) heart failure with preserved ejection fraction (HCC)    EF=60-60%   Actinic keratosis 01/17/2015   R forearm   Adjustment disorder with anxiety    Adverse effect of other narcotics, sequela    Intolerance to all narcotics   Anti-Duffy antibodies present    Aortic valve stenosis    Arthritis    some in my hands (11/11/2012)   Atrial fibrillation, permanent (HCC)    Eliquis    Atypical mole 03/25/2018   L forearm - severe   Automatic implantable cardioverter-defibrillator in situ    Avascular necrosis of hip (HCC) 05/03/2011   Carotid artery stenosis 09/2007   a. 09/2007: 60-79% bilateral (stable); b. 10/2008: 40-59% R 60-79%    Cellulitis of left lower extremity 07/13/2020   CKD (chronic kidney disease), stage III (HCC)    COPD (chronic obstructive pulmonary disease) (HCC)    Coronary artery disease    non-obstructive by 2006 cath   COVID-19 virus infection 02/12/2022   Displaced fracture of left femoral neck (HCC) 10/09/2018   GI bleed 03/28/2020   AVM   Gram-negative bacteremia 09/05/2023   High cholesterol    Hypertension 05/20/2011   Hypertr obst cardiomyop    Hypotension, unspecified    cardiac cath 2006..nonobstructive CAD 30-40s lesions.SABRAETT 1/09 nondiagnostic due to poor HR response..Right Renal Cancer 2003   Iron  deficiency anemia    Long term (current) use of anticoagulants    On home oxygen  therapy    3L [place]   Osteoarthritis of right hip    PAD (peripheral artery disease)    Pancreatic cancer (HCC)    sandostatin    PONV (postoperative nausea and vomiting)    Presence of permanent cardiac pacemaker    Pulmonary HTN (HCC)    RECTAL BLEEDING 10/13/2009   Qualifier: Diagnosis of  By: Kerman NP, Paula     Red blood cell antibody positive with compatible PRBC difficult to obtain    Anti FYA (Duffy a) antibody. Must be transfused with PRB  which are Duffy Antigen Negative and Crossmatch Compatible   Renal cell carcinoma (HCC) 2005   s/p right nephrectomy   Squamous cell carcinoma of skin 01/17/2015   R lat wrist   Squamous cell carcinoma of skin 01/29/2018   R post upper leg - superficially invasive   Squamous cell carcinoma of skin 03/25/2018   L lat foot   Squamous cell carcinoma of skin 11/09/2020   left lat foot - EDC 01/01/21, recurrent 01/31/21 - MOHs 02/22/21   Squamous cell carcinoma of skin 12/19/2021   Right Posterior Medial Thigh, EDC   Squamous cell carcinoma of skin 12/19/2021   SCC IS, L lat heel, EDC 02/04/2022   Squamous cell carcinoma of skin 01/21/2022   L forearm, EDC 02/04/2022   Squamous cell carcinoma of skin 01/21/2022   R lower leg below knee, EDC 02/04/2022   Squamous cell carcinoma of skin 01/21/2022   SCCIS, R post heel, EDC 02/04/2022   Squamous cell carcinoma of skin 07/01/2022   left lower abdomen, in situ, EDC   Squamous cell carcinoma of skin 07/01/2022   left medial chest, EDC   Urge incontinence  Venous stasis of both lower extremities     Past Surgical History:  Procedure Laterality Date   ABDOMINAL AORTOGRAM W/LOWER EXTREMITY N/A 02/12/2021   Procedure: ABDOMINAL AORTOGRAM W/LOWER EXTREMITY;  Surgeon: Sheree Penne Bruckner, MD;  Location: [facility] INVASIVE CV LAB;  Service: Cardiovascular;  Laterality: N/A;   ABDOMINAL HYSTERECTOMY  1975   for benign causes   APPENDECTOMY     BI-VENTRICULAR PACEMAKER UPGRADE  05/04/2010   BIOPSY  02/28/2019   Procedure: BIOPSY;  Surgeon: Teressa Toribio SQUIBB, MD;  Location: [facility] ENDOSCOPY;  Service: Endoscopy;;   BIOPSY  03/04/2022   Procedure: BIOPSY;  Surgeon: Wilhelmenia Aloha Raddle., MD;  Location: [facility];  Service: Gastroenterology;;   CARDIAC CATHETERIZATION  2006   CARDIOVERSION N/A 02/20/2018   Procedure: CARDIOVERSION;  Surgeon: [name], MD;  Location: [facility] ORS;  Service: Cardiovascular;  Laterality: N/A;   CARDIOVERSION  N/A 03/27/2018   Procedure: CARDIOVERSION;  Surgeon: [name], MD;  Location: [facility] ORS;  Service: Cardiovascular;  Laterality: N/A;   CATARACT EXTRACTION W/ INTRAOCULAR LENS  IMPLANT, BILATERAL  01/2006-02-2006   CHOLECYSTECTOMY N/A 11/11/2012   Procedure: LAPAROSCOPIC CHOLECYSTECTOMY WITH INTRAOPERATIVE CHOLANGIOGRAM;  Surgeon: Donnice POUR. Belinda, MD;  Location: [facility] OR;  Service: General;  Laterality: N/A;   COLONOSCOPY WITH PROPOFOL  N/A 02/28/2019   Procedure: COLONOSCOPY WITH PROPOFOL ;  Surgeon: Teressa Toribio SQUIBB, MD;  Location: [facility] ENDOSCOPY;  Service: Endoscopy;  Laterality: N/A;   ENTEROSCOPY N/A 03/30/2020   Procedure: ENTEROSCOPY;  Surgeon: San Sandor GAILS, DO;  Location: [facility] ENDOSCOPY;  Service: Gastroenterology;  Laterality: N/A;   ENTEROSCOPY N/A 02/10/2021   Procedure: ENTEROSCOPY;  Surgeon: Rollin Dover, MD;  Location: [facility] ENDOSCOPY;  Service: Endoscopy;  Laterality: N/A;   ENTEROSCOPY N/A 02/15/2022   Procedure: ENTEROSCOPY;  Surgeon: Federico Rosario BROCKS, MD;  Location: [facility] ENDOSCOPY;  Service: Gastroenterology;  Laterality: N/A;   ENTEROSCOPY N/A 11/06/2022   Procedure: ENTEROSCOPY;  Surgeon: Wilhelmenia Aloha Raddle., MD;  Location: [facility] ENDOSCOPY;  Service: Gastroenterology;  Laterality: N/A;   EP IMPLANTABLE DEVICE N/A 02/21/2016   Procedure: ICD Generator Changeout;  Surgeon: Elspeth BROCKS Sage, MD;  Location: [facility] INVASIVE CV LAB;  Service: Cardiovascular;  Laterality: N/A;   ESOPHAGOGASTRODUODENOSCOPY N/A 03/04/2022   Procedure: ESOPHAGOGASTRODUODENOSCOPY (EGD);  Surgeon: Wilhelmenia Aloha Raddle., MD;  Location: [facility];  Service: Gastroenterology;  Laterality: N/A;   ESOPHAGOGASTRODUODENOSCOPY (EGD) WITH PROPOFOL  N/A 02/28/2019   Procedure: ESOPHAGOGASTRODUODENOSCOPY (EGD) WITH PROPOFOL ;  Surgeon: Teressa Toribio SQUIBB, MD;  Location: [facility] ENDOSCOPY;  Service: Endoscopy;  Laterality: N/A;   EUS N/A 03/04/2022   Procedure: UPPER ENDOSCOPIC ULTRASOUND (EUS) RADIAL;  Surgeon: Wilhelmenia Aloha Raddle., MD;  Location: [facility] ENDOSCOPY;  Service: Gastroenterology;  Laterality: N/A;   FINE NEEDLE ASPIRATION N/A 03/04/2022   Procedure: FINE NEEDLE ASPIRATION (FNA) LINEAR;  Surgeon: Wilhelmenia Aloha Raddle., MD;  Location: [facility] ENDOSCOPY;  Service: Gastroenterology;  Laterality: N/A;   GIVENS CAPSULE STUDY N/A 03/15/2019   Procedure: GIVENS CAPSULE STUDY;  Surgeon: Eda Iha, MD;  Location: [facility] ENDOSCOPY;  Service: Gastroenterology;  Laterality: N/A;   HOT HEMOSTASIS N/A 03/30/2020   Procedure: HOT HEMOSTASIS (ARGON PLASMA COAGULATION/BICAP);  Surgeon: San Sandor GAILS, DO;  Location: [facility] ENDOSCOPY;  Service: Gastroenterology;  Laterality: N/A;   HOT HEMOSTASIS N/A 02/10/2021   Procedure: HOT HEMOSTASIS (ARGON PLASMA COAGULATION/BICAP);  Surgeon: Rollin Dover, MD;  Location: [facility] ENDOSCOPY;  Service: Endoscopy;  Laterality: N/A;   HOT HEMOSTASIS N/A 02/15/2022   Procedure: HOT HEMOSTASIS (ARGON PLASMA COAGULATION/BICAP);  Surgeon: Federico Rosario BROCKS, MD;  Location: [facility] ENDOSCOPY;  Service: Gastroenterology;  Laterality: N/A;   HOT HEMOSTASIS N/A 11/06/2022   Procedure: HOT HEMOSTASIS (ARGON PLASMA COAGULATION/BICAP);  Surgeon: Wilhelmenia Aloha Raddle., MD;  Location: [facility] ENDOSCOPY;  Service: Gastroenterology;  Laterality: N/A;   INSERT / REPLACE / REMOVE PACEMAKER  05/01/2011   02-28-05-/05-04-10-ICD-MEDTRONIC MAXIMAL DR   IR ANGIOGRAM SELECTIVE EACH ADDITIONAL VESSEL  02/18/2022   IR ANGIOGRAM SELECTIVE EACH ADDITIONAL VESSEL  02/18/2022   IR ANGIOGRAM VISCERAL SELECTIVE  02/16/2022   IR ANGIOGRAM VISCERAL SELECTIVE  02/16/2022   IR EMBO ART  VEN HEMORR LYMPH EXTRAV  INC GUIDE ROADMAPPING  02/16/2022   IR THORACENTESIS ASP PLEURAL SPACE W/IMG GUIDE  02/21/2022   IR THORACENTESIS ASP PLEURAL SPACE W/IMG GUIDE  02/22/2022   IR US  GUIDE VASC ACCESS RIGHT  02/16/2022   JOINT REPLACEMENT     LAPAROSCOPIC CHOLECYSTECTOMY  11/11/2012   LAPAROSCOPIC LYSIS OF ADHESIONS N/A 11/11/2012    Procedure: LAPAROSCOPIC LYSIS OF ADHESIONS;  Surgeon: Donnice POUR. Belinda, MD;  Location: [facility] OR;  Service: General;  Laterality: N/A;   NEPHRECTOMY Right 06/2001    S/P RENAL CELL CANCER   PERIPHERAL VASCULAR INTERVENTION Bilateral 02/12/2021   Procedure: PERIPHERAL VASCULAR INTERVENTION;  Surgeon: Sheree Penne Bruckner, MD;  Location: [facility] INVASIVE CV LAB;  Service: Cardiovascular;  Laterality: Bilateral;  Iliac artery stents   PRESSURE SENSOR/CARDIOMEMS N/A 02/03/2019   Procedure: PRESSURE SENSOR/CARDIOMEMS;  Surgeon: [name], MD;  Location: [facility] INVASIVE CV LAB;  Service: Cardiovascular;  Laterality: N/A;   RIGHT HEART CATH N/A 11/09/2018   Procedure: RIGHT HEART CATH;  Surgeon: [name], MD;  Location: [facility] INVASIVE CV LAB;  Service: Cardiovascular;  Laterality: N/A;   RIGHT HEART CATH N/A 03/08/2019   Procedure: RIGHT HEART CATH;  Surgeon: [name], MD;  Location: [facility] INVASIVE CV LAB;  Service: Cardiovascular;  Laterality: N/A;   SUBMUCOSAL TATTOO INJECTION  02/28/2019   Procedure: SUBMUCOSAL TATTOO INJECTION;  Surgeon: Teressa Toribio SQUIBB, MD;  Location: [facility] ENDOSCOPY;  Service: Endoscopy;;   SUBMUCOSAL TATTOO INJECTION  11/06/2022   Procedure: SUBMUCOSAL TATTOO INJECTION;  Surgeon: Wilhelmenia Aloha Raddle., MD;  Location: [facility] ENDOSCOPY;  Service: Gastroenterology;;   TOTAL HIP ARTHROPLASTY Right 05/03/2011   Procedure: TOTAL HIP ARTHROPLASTY ANTERIOR APPROACH;  Surgeon: Bruckner CINDERELLA Poli;  Location: [facility] ORS;  Service: Orthopedics;  Laterality: Right;  Removal of Cannulated Screws Right Hip, Right Direct Anterior Hip Replacement   TOTAL HIP ARTHROPLASTY Left 10/09/2018   Procedure: TOTAL HIP ARTHROPLASTY ANTERIOR APPROACH;  Surgeon: Fidel Rogue, MD;  Location: [facility] OR;  Service: Orthopedics;  Laterality: Left;     Scheduled Meds:  furosemide   80 mg Intravenous BID   heparin   5,000 Units Subcutaneous Q8H   lidocaine -EPINEPHrine -tetracaine  3 mL Topical Once    metolazone   2.5 mg Oral Once per day on Tuesday Thursday Saturday   And   potassium chloride   40 mEq Oral Once per day on Tuesday Thursday Saturday   polyethylene glycol  17 g Oral Daily   potassium chloride  SA  40 mEq Oral Daily   rosuvastatin   10 mg Oral Daily   sodium chloride  flush  3 mL Intravenous Q12H   Continuous Infusions:   ceFAZolin  (ANCEF ) IV 2 g (04/04/24 1020)   PRN Meds: acetaminophen , fluticasone , ipratropium-albuterol , ondansetron  (ZOFRAN ) IV, sodium chloride  flush, traMADol   Allergies:    Allergies  Allergen Reactions   Codeine Nausea And Vomiting and Other (See Comments)    Hallucinations, too- CANNOT HAVE; thinks she may have  received Narcan  to reverse the effect   Dilaudid  [Hydromorphone ] Other (See Comments)    Excessive Somnolence- Required Narcan  X2   Tape Other (See Comments)    CAN TOLERATE ONLY EASY-RELEASE, PAPER TAPE, AS THE SKIN IS VERY THIN AND WILL BRUISE AND TEAR VERY EASILY   Amoxil  [Amoxicillin ] Nausea Only   Asa [Aspirin ] Other (See Comments)    Told to avoid due to reduced kidney function, has 1 remaining kidney   Ms Contin  [Morphine ] Nausea And Vomiting and Other (See Comments)    Hallucinations, also   Neurontin  [Gabapentin ] Other (See Comments)    Out of it   Nsaids Other (See Comments)    Told to avoid due to reduced kidney function, has 1 remaining kidney    Social History:   Social History   Socioeconomic History   Marital status: Widowed    Spouse name: Not on file   Number of children: 2   Years of education: high school   Highest education level: Not on file  Occupational History   Occupation: Retired    Associate Professor: RETIRED  Tobacco Use   Smoking status: Former    Current packs/day: 0.00    Average packs/day: 0.5 packs/day for 40.0 years (20.0 ttl pk-yrs)    Types: Cigarettes    Start date: 05/06/1961    Quit date: 05/06/2001    Years since quitting: 22.9   Smokeless tobacco: Never  Vaping Use   Vaping status: Never  Used  Substance and Sexual Activity   Alcohol use: No    Alcohol/week: 0.0 standard drinks of alcohol   Drug use: No   Sexual activity: Not Currently  Other Topics Concern   Not on file  Social History Narrative   Would desire CPR   07/11/20   From: the area   Living: with great-grandson - Norman (2003)   Work: retired - clerical      Family: 2 children - Devere and Gregory - 2 grand chlidren - 5 great grand children      Enjoys: spending time with friends - watch move, eat out, spend time      Exercise: walking around the house   Diet: not good, limits red meat, limits salt, low appetite      Safety   Seat belts: Yes    Guns: Yes  and secure   Safe in relationships: Yes    Social Drivers of Health   Financial Resource Strain: Low Risk  (11/06/2023)   Overall Financial Resource Strain (CARDIA)    Difficulty of Paying Living Expenses: Not hard at all  Food Insecurity: No Food Insecurity (04/02/2024)   Hunger Vital Sign    Worried About Running Out of Food in the Last Year: Never true    Ran Out of Food in the Last Year: Never true  Transportation Needs: No Transportation Needs (04/02/2024)   PRAPARE - Administrator, Civil Service (Medical): No    Lack of Transportation (Non-Medical): No  Physical Activity: Insufficiently Active (11/06/2023)   Exercise Vital Sign    Days of Exercise per Week: 3 days    Minutes of Exercise per Session: 20 min  Stress: No Stress Concern Present (11/06/2023)   [employer] of Occupational Health - Occupational Stress Questionnaire    Feeling of Stress: Not at all  Social Connections: Socially Isolated (04/02/2024)   Social Connection and Isolation Panel    Frequency of Communication with Friends and Family: More than three times a week  Frequency of Social Gatherings with Friends and Family: More than three times a week    Attends Religious Services: Never    Database Administrator or Organizations: No    Attends Tax Inspector Meetings: Never    Marital Status: Widowed  Intimate Partner Violence: Not At Risk (04/02/2024)   Humiliation, Afraid, Rape, and Kick questionnaire    Fear of Current or Ex-Partner: No    Emotionally Abused: No    Physically Abused: No    Sexually Abused: No    Family History:   Family History  Problem Relation Age of Onset   Heart failure Mother    Early death Father        car accident   Breast cancer Maternal Aunt 66   Breast cancer Cousin    Breast cancer Other    Colon cancer Neg Hx    Esophageal cancer Neg Hx    Liver disease Neg Hx    Pancreatic cancer Neg Hx    Rectal cancer Neg Hx      ROS:  Please see the history of present illness.  All other ROS reviewed and negative.     Physical Exam/Data: Vitals:   04/03/24 2338 04/04/24 0505 04/04/24 0700 04/04/24 0813  BP: 111/66 (!) 120/48  (!) 121/90  Pulse: 60 62  65  Resp: 18 17  16   Temp: 98.2 F (36.8 C) 98.3 F (36.8 C)  97.9 F (36.6 C)  TempSrc: Oral Oral  Axillary  SpO2: 100%   100%  Weight:   62.7 kg     Intake/Output Summary (Last 24 hours) at 04/04/2024 1047 Last data filed at 04/04/2024 9372 Gross per 24 hour  Intake 350.6 ml  Output 1200 ml  Net -849.4 ml      04/04/2024    7:00 AM 03/10/2024    2:22 PM 03/01/2024   11:39 AM  Last 3 Weights  Weight (lbs) 138 lb 3.7 oz 114 lb 114 lb 11.2 oz  Weight (kg) 62.7 kg 51.71 kg 52.028 kg     Body mass index is 22.31 kg/m.  General:  Frail, elderly female. Laying in the bed with head elevated.  HEENT: normal Neck: + JVD Vascular: Radial pulses 2+ bilaterally Cardiac:  normal S1, S2; irregular rate and rhythm. Grade 2/6 systolic murmur  Lungs:  crackles in bilateral lung bases. Normal Wob on 4 L via [place]  Abd: soft, nontender  Ext: trace edema in BLE. L lower leg wrapped in bandages. Some scabbing and chronic skin thickening noted bilaterally  Musculoskeletal:  No deformities  Skin: warm and dry  Neuro:  CNs 2-12 intact, no focal  abnormalities noted Psych:  Normal affect   EKG:  The EKG was personally reviewed and demonstrates:  No EKG collected this admission  Telemetry:  Telemetry was personally reviewed and demonstrates:  Atrial fibrillation with occasional pacing. HR in the 60s-70s  Relevant CV Studies: Cardiac Studies & Procedures   ______________________________________________________________________________________________     ECHOCARDIOGRAM  ECHOCARDIOGRAM COMPLETE 04/04/2024  Narrative ECHOCARDIOGRAM REPORT    Patient Name:   LOLLIE GUNNER Date of Exam: 04/04/2024 Medical Rec #:  [id number]         Height:       66.0 in Accession #:    [id number]        Weight:       138.2 lb Date of Birth:  1933-06-02         BSA:  1.709 m Patient Age:    89 years          BP:           120/48 mmHg Patient Gender: F                 HR:           70 bpm. Exam Location:  Inpatient  Procedure: 2D Echo, Cardiac Doppler and Color Doppler (Both Spectral and Color Flow Doppler were utilized during procedure).  Indications:    I50.40* Unspecified combined systolic (congestive) and diastolic (congestive) heart failure  History:        Patient has prior history of Echocardiogram examinations, most recent 05/28/2023. CHF and Cardiomyopathy, Defibrillator and Abnormal ECG, COPD, Mitral Valve Disease and Aortic Valve Disease, Arrythmias:Bradycardia, Signs/Symptoms:Bacteremia; Risk Factors:Hypertension.  Sonographer:    Ellouise Mose RDCS Referring Phys: [id number] [facility] POK[facility]   Sonographer Comments: Technically difficult study due to poor echo windows. Very difficult angle due to patient position on right of bed and could not turn due to leg injury. IMPRESSIONS   1. Left ventricular ejection fraction, by estimation, is 55 to 60%. The left ventricle has normal function. The left ventricle has no regional wall motion abnormalities. Left ventricular diastolic parameters are consistent with Grade III  diastolic dysfunction (restrictive). Elevated left atrial pressure. There is the interventricular septum is flattened in systole and diastole, consistent with right ventricular pressure and volume overload. 2. Right ventricular systolic function is moderately reduced. The right ventricular size is normal. There is severely elevated pulmonary artery systolic pressure. The estimated right ventricular systolic pressure is 91.5 mmHg. 3. Right atrial size was moderately dilated. 4. The mitral valve is degenerative. Mild to moderate mitral valve regurgitation. Moderate mitral stenosis. The mean mitral valve gradient is 7.0 mmHg. Moderate mitral annular calcification. 5. Tricuspid valve regurgitation is severe. 6. The aortic valve is calcified. There is severe calcifcation of the aortic valve. There is severe thickening of the aortic valve. Aortic valve regurgitation is mild. Moderate aortic valve stenosis. Aortic valve area, by VTI measures 1.18 cm. Aortic valve mean gradient measures 20.5 mmHg. Aortic valve Vmax measures 3.00 m/s. 7. The inferior vena cava is dilated in size with >50% respiratory variability, suggesting right atrial pressure of 8 mmHg. 8. A small pericardial effusion is present. The pericardial effusion is circumferential. Moderate pleural effusion in the left lateral region.  FINDINGS Left Ventricle: Left ventricular ejection fraction, by estimation, is 55 to 60%. The left ventricle has normal function. The left ventricle has no regional wall motion abnormalities. The left ventricular internal cavity size was normal in size. There is no left ventricular hypertrophy. The interventricular septum is flattened in systole and diastole, consistent with right ventricular pressure and volume overload. Left ventricular diastolic parameters are consistent with Grade III diastolic dysfunction (restrictive). Elevated left atrial pressure.  Right Ventricle: The right ventricular size is normal. No  increase in right ventricular wall thickness. Right ventricular systolic function is moderately reduced. There is severely elevated pulmonary artery systolic pressure. The tricuspid regurgitant velocity is 4.57 m/s, and with an assumed right atrial pressure of 8 mmHg, the estimated right ventricular systolic pressure is 91.5 mmHg.  Left Atrium: Left atrial size was normal in size.  Right Atrium: Right atrial size was moderately dilated.  Pericardium: A small pericardial effusion is present. The pericardial effusion is circumferential.  Mitral Valve: The mitral valve is degenerative in appearance. There is moderate thickening of the mitral valve leaflet(s). Moderate mitral  annular calcification. Mild to moderate mitral valve regurgitation. Moderate mitral valve stenosis. MV peak gradient, 22.5 mmHg. The mean mitral valve gradient is 7.0 mmHg.  Tricuspid Valve: The tricuspid valve is normal in structure. Tricuspid valve regurgitation is severe. No evidence of tricuspid stenosis.  Aortic Valve: The aortic valve is calcified. There is severe calcifcation of the aortic valve. There is severe thickening of the aortic valve. Aortic valve regurgitation is mild. Moderate aortic stenosis is present. Aortic valve mean gradient measures 20.5 mmHg. Aortic valve peak gradient measures 36.1 mmHg. Aortic valve area, by VTI measures 1.18 cm.  Pulmonic Valve: The pulmonic valve was normal in structure. Pulmonic valve regurgitation is mild to moderate. No evidence of pulmonic stenosis.  Aorta: The aortic root and ascending aorta are structurally normal, with no evidence of dilitation.  Venous: The inferior vena cava is dilated in size with greater than 50% respiratory variability, suggesting right atrial pressure of 8 mmHg.  IAS/Shunts: No atrial level shunt detected by color flow Doppler.  Additional Comments: A device lead is visualized. There is a moderate pleural effusion in the left lateral  region.   LEFT VENTRICLE PLAX 2D LVIDd:         4.30 cm     Diastology LVIDs:         2.80 cm     LV e' medial:    9.14 cm/s LV PW:         1.00 cm     LV E/e' medial:  23.4 LV IVS:        1.10 cm     LV e' lateral:   9.68 cm/s LVOT diam:     2.10 cm     LV E/e' lateral: 22.1 LV SV:         82 LV SV Index:   48 LVOT Area:     3.46 cm  LV Volumes (MOD) LV vol d, MOD A2C: 90.5 ml LV vol d, MOD A4C: 22.7 ml LV vol s, MOD A2C: 17.4 ml LV vol s, MOD A4C: 19.2 ml LV SV MOD A2C:     73.1 ml LV SV MOD A4C:     22.7 ml LV SV MOD BP:      18.1 ml  RIGHT VENTRICLE            IVC RV S prime:     9.79 cm/s  IVC diam: 2.60 cm TAPSE (M-mode): 1.1 cm PULMONARY VEINS Diastolic Velocity: 53.50 cm/s S/D Velocity:       1.30 Systolic Velocity:  69.60 cm/s  LEFT ATRIUM             Index        RIGHT ATRIUM           Index LA diam:        4.30 cm 2.52 cm/m   RA Area:     20.60 cm LA Vol (A2C):   46.2 ml 27.03 ml/m  RA Volume:   55.90 ml  32.70 ml/m LA Vol (A4C):   33.4 ml 19.54 ml/m LA Biplane Vol: 41.9 ml 24.51 ml/m AORTIC VALVE AV Area (Vmax):    1.24 cm AV Area (Vmean):   1.18 cm AV Area (VTI):     1.18 cm AV Vmax:           300.50 cm/s AV Vmean:          213.500 cm/s AV VTI:            0.698 m  AV Peak Grad:      36.1 mmHg AV Mean Grad:      20.5 mmHg LVOT Vmax:         108.00 cm/s LVOT Vmean:        72.600 cm/s LVOT VTI:          0.237 m LVOT/AV VTI ratio: 0.34  AORTA Ao Root diam: 2.80 cm  MITRAL VALVE                TRICUSPID VALVE MV Area (PHT): 4.60 cm     TR Peak grad:   83.5 mmHg MV Area VTI:   1.56 cm     TR Vmax:        457.00 cm/s MV Peak grad:  22.5 mmHg MV Mean grad:  7.0 mmHg     SHUNTS MV Vmax:       2.37 m/s     Systemic VTI:  0.24 m MV Vmean:      116.0 cm/s   Systemic Diam: 2.10 cm MV Decel Time: 165 msec MV E velocity: 214.00 cm/s MV A velocity: 98.50 cm/s MV E/A ratio:  2.17  Wilbert Bihari MD Electronically signed by Wilbert Bihari  MD Signature Date/Time: 04/04/2024/11:08:14 AM    Final          ______________________________________________________________________________________________       Laboratory Data: High Sensitivity Troponin:  No results for input(s): TROPONINIHS in the last 720 hours.   Chemistry Recent Labs  Lab 04/02/24 1537 04/03/24 0340 04/04/24 0232  NA 139  141 140 135  K 3.2*  3.2* 3.7 4.9  CL 107  104 106 101  CO2 23 24 24   GLUCOSE 103*  99 111* 109*  BUN 43*  41* 42* 39*  CREATININE 1.27*  1.50* 1.37* 1.45*  CALCIUM  8.6* 8.6* 8.8*  MG  --  1.6* 1.9  GFRNONAA 41* 37* 35*  ANIONGAP 9 10 10     Recent Labs  Lab 04/02/24 1537  PROT 6.0*  ALBUMIN  3.2*  AST 35  ALT 19  ALKPHOS 62  BILITOT 0.8   Lipids No results for input(s): CHOL, TRIG, HDL, LABVLDL, LDLCALC, CHOLHDL in the last 168 hours.  Hematology Recent Labs  Lab 04/02/24 1537 04/03/24 0340 04/04/24 0232  WBC 5.0 4.0 5.5  RBC 3.02* 2.86* 2.86*  HGB 8.1*  9.2* 7.5* 7.6*  HCT 27.2*  27.0* 25.6* 25.4*  MCV 90.1 89.5 88.8  MCH 26.8 26.2 26.6  MCHC 29.8* 29.3* 29.9*  RDW 21.1* 21.0* 20.9*  PLT 184 189 214   Thyroid  No results for input(s): TSH, FREET4 in the last 168 hours.  BNP Recent Labs  Lab 04/02/24 1537  BNP 354.1*    DDimer No results for input(s): DDIMER in the last 168 hours.  Radiology/Studies:  DG Chest Portable 1 View Result Date: 04/02/2024 EXAM: 1 VIEW(S) XRAY OF THE CHEST 04/02/2024 02:47:00 PM COMPARISON: 09/03/2023 CLINICAL HISTORY: sob FINDINGS: LINES, TUBES AND DEVICES: AICD leads are unchanged and appear in appropriate position. LUNGS AND PLEURA: Mild pulmonary edema with diffuse accentuated interstitial markings. Blunting of the costophrenic angles favoring pleural effusions, right greater than left. No pneumothorax. HEART AND MEDIASTINUM: Cardiomegaly. Calcified aorta. AICD leads are unchanged and appear in appropriate position. BONES AND SOFT TISSUES:  Stable benign appearing sclerotic lesion proximal left humerus. No acute osseous abnormality. IMPRESSION: 1. Mild pulmonary edema with diffuse accentuated interstitial markings. 2. Blunting of the costophrenic angles favoring pleural effusions, right greater than left. 3. Cardiomegaly with unchanged AICD leads.  Electronically signed by: Ryan Salvage MD 04/02/2024 03:23 PM EST RP Workstation: [id number]   DG Tibia/Fibula Left Port Result Date: 04/02/2024 CLINICAL DATA:  Fall EXAM: PORTABLE LEFT TIBIA AND FIBULA - 2 VIEW COMPARISON:  None Available. FINDINGS: There is soft tissue laceration or ulceration of the anterior lower extremity. There is no underlying periosteal reaction, fracture or dislocation. The bones are diffusely osteopenic. There is diffuse soft tissue swelling of the lower extremity. IMPRESSION: Soft tissue laceration or ulceration of the anterior lower extremity. No underlying periosteal reaction, fracture or dislocation. Electronically Signed   By: Greig Pique M.D.   On: 04/02/2024 15:16     Assessment and Plan:  Acute on Chronic Diastolic Heart Failure  -Echo this admission showed EF EF 55-60%, grade III DD, moderately reduced RV systolic function, severely elevated PA systolic pressure, mild-moderate MR, moderate MS, severe TR, mild AI, moderate AS  - Patient has been on torsemide  100 mg daily. Recently, this was increased to 100 mg BID and metolazone  2.5 mg was added 3 days per week by advanced heart failure  - Now presents after a fall. Reports shortness of breath, orthopnea, lower extremity swelling  - BNP 354. Not, this seems to be similar to her baseline  - CXR showed mild pulmonary edema - Has been on IV lasix  80 mg BID- put out at least 1.2 L urine yesterday. Weights not recorded. Creatinine bumped from 1.27>1.45 today, but this is still stable compared to baseline  - Continues to have trace edema, crackles in lung bases. Continue IV lasix  80 mg BID for now - Repeat  BMP in AM. Continue strict I/Os, daily weights  - Plan to eventually get her back on home torsemide  100 mg BID and metolazone  2.5 mg three times/week   Valvular Disease  - Echo this admission showed mild-moderate MR, moderate MS, severe TR, mild AI, moderate AS  - Patient not a candidate for intervention. Continue diuresis as above   Pulmonary HTN  - Followed by advanced heart failure. Not a candidate for pulmonary vasodilators   Permanent atrial fibrillation  - HR well controlled without rate controlling medications  - Not on AC due to chronic anemia and GI bleeds   Otherwise per primary  - L lower extremity laceration with cellulitis  - CKD stage IV  - Chronic anemia  - Pancreatic neuroendocrine tumor  - COPD with chronic hypoxic respiratory failure  - Deconditioning, fall    Risk Assessment/Risk Scores:   New York  Heart Association (NYHA) Functional Class NYHA Class IV  CHA2DS2-VASc Score = 6   This indicates a 9.7% annual risk of stroke. The patient's score is based upon: CHF History: 1 HTN History: 0 Diabetes History: 0 Stroke History: 2 Vascular Disease History: 0 Age Score: 2 Gender Score: 1  For questions or updates, please contact [facility] Please consult [email or web address] for contact info under    Signed, Rollo FABIENE Louder, PA-C  04/04/2024 10:47 AM

## 2024-04-04 NOTE — Consult Note (Signed)
 WOC team consulted for L lower leg sutured wound and open laceration. Secure chat to primary team requesting photo documentation of wounds be uploaded to media.   Thank you,    Powell Bar MSN, RN-BC, [employer]

## 2024-04-04 NOTE — TOC Initial Note (Addendum)
 Transition of Care [facility]) - Initial/Assessment Note    Patient Details  Name: [name] MRN: [id number] Date of Birth: 01-17-1934  [facility]) CM/SW Contact:    Lauraine FORBES Saa, LCSWA Phone Number: 04/04/2024, 12:06 PM  Clinical Narrative:                  12:06 PM CSW introduced self and role to patient. CSW informed patient of therapy's recommendation of patient discharging to SNF. Patient deferred disposition plan to her daughter, [name]. Patient confirmed she is from home and not an ALF. CSW introduced self and role to [place]. CSW informed [name] of patient deference and therapy's recommendation of patient discharging to SNF. [name] was agreeable with recommendation and consented CSW to send patient's FL2 to SNFs in [place] but expressed preference in [facility], where patient has SNF history. CSW sent patient's FL2 to SNFs in [facility]. CSW provided SDOH (social connections) resources. Per chart review, patient has a PCP and insurance. Patient has HH history with [facility] and [facility]. Patient has DME (rollator, oxygen , BSC, manual wheelchair, shower stool) history with [facility]. Patient's preferred pharmacy's are [facility]. CSW will continue to follow.  Expected Discharge Plan: Skilled Nursing Facility Barriers to Discharge: Continued Medical Work up, English As A Second Language Teacher, SNF Pending bed offer   Patient Goals and CMS Choice Patient states their goals for this hospitalization and ongoing recovery are:: SNF          Expected Discharge Plan and Services In-house Referral: Clinical Social Work   Post Acute Care Choice: Skilled Nursing Facility Living arrangements for the past 2 months: Single Family Home                                      Prior Living Arrangements/Services Living arrangements for the past 2 months: Single Family Home Lives with:: Relatives Patient  language and need for interpreter reviewed:: Yes        Need for Family Participation in Patient Care: No (Comment)   Current home services: DME Criminal Activity/Legal Involvement Pertinent to Current Situation/Hospitalization: No - Comment as needed  Activities of Daily Living   ADL Screening (condition at time of admission) Independently performs ADLs?: No Does the patient have a NEW difficulty with bathing/dressing/toileting/self-feeding that is expected to last >3 days?: Yes (Initiates electronic notice to provider for possible OT consult) Does the patient have a NEW difficulty with getting in/out of bed, walking, or climbing stairs that is expected to last >3 days?: Yes (Initiates electronic notice to provider for possible PT consult) Does the patient have a NEW difficulty with communication that is expected to last >3 days?: No Is the patient deaf or have difficulty hearing?: Yes Does the patient have difficulty seeing, even when wearing glasses/contacts?: No Does the patient have difficulty concentrating, remembering, or making decisions?: Yes  Permission Sought/Granted Permission sought to share information with : Family Supports, Oceanographer granted to share information with : Yes, Verbal Permission Granted  Share Information with NAME: Jaely Silman  Permission granted to share info w AGENCY: SNF  Permission granted to share info w Relationship: Daughter  Permission granted to share info w Contact Information: [phone]  Emotional Assessment Appearance:: Appears stated age Attitude/Demeanor/Rapport: Engaged Affect (typically observed): Accepting, Appropriate, Adaptable, Calm, Stable, Pleasant Orientation: : Oriented to Self, Oriented to Place, Oriented to  Time, Oriented to Situation Alcohol / Substance Use: Not Applicable Psych Involvement: No (comment)  Admission diagnosis:  CHF exacerbation (HCC) [I50.9] Patient Active Problem List    Diagnosis Date Noted   CHF exacerbation (HCC) 04/02/2024   Protein-calorie malnutrition, severe 01/14/2024   MRSA (methicillin resistant staph aureus) culture positive 01/12/2024   History of bacteremia 09/16/2023   Pulmonary hypertension due to COPD (HCC) 09/06/2023   Sepsis due to cellulitis (HCC) 09/03/2023   Presence of heart assist device (HCC) 08/30/2023   Medication management 08/29/2023   Cellulitis 08/26/2023   FTT (failure to thrive) in adult 06/24/2023   Venous stasis ulcer of left lower leg with edema of left lower leg (HCC) 06/18/2023   (HFpEF) heart failure with preserved ejection fraction (HCC)    Renal cell carcinoma s/p nephrectomy 2003(HCC) 11/25/2022   Herpes zoster 09/02/2022   Decreased hearing of right ear 07/25/2022   DNR (do not resuscitate) 07/25/2022   Bilateral impacted cerumen 05/20/2022   Chronic hypoxic respiratory failure, on home oxygen  therapy (HCC) 04/03/2022   Primary pancreatic neuroendocrine tumor (HCC) 03/20/2022   Osteoporosis 10/11/2021   Squamous cell carcinoma of foot, 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   Chronic gout due to renal impairment 01/30/2021   History of blood transfusion reaction 04/17/2020   Angiodysplasia of small intestine    Gastric polyps    GI bleed 03/28/2020   Palliative care by specialist    Advanced directives, counseling/discussion    Normocytic anemia 01/06/2020   History of gastritis 11/11/2019   Chronic venous hypertension w ulceration (HCC)    Black stools    Chronic bilateral pleural effusions 11/04/2018   Iliac artery stenosis, bilateral 07/14/2017   Chronic blood loss anemia 11/18/2016   Pulmonary hypertension (HCC) 12/04/2015   B12 deficiency 05/22/2015   PAD (peripheral artery disease)    History of nephrectomy secondary to RCC    TIA (transient ischemic attack) 08/16/2014   Other fatigue 01/14/2014   Chronic diastolic CHF (congestive heart failure) (HCC) 06/04/2013   Valvular heart disease 06/04/2013   Allergic  rhinitis 01/01/2013   Chronic cholecystitis with calculus 09/15/2012   Claudication 01/21/2012   Hypertension 05/20/2011   HOCM/IHSS 07/14/2009   Hyperlipidemia 04/10/2009   INCONTINENCE, URGE 04/10/2009   NICM (nonischemic cardiomyopathy) (HCC) 10/27/2008   Atrial fibrillation, permanent (HCC) 10/27/2008   SINUS BRADYCARDIA 10/27/2008   Carotid artery stenosis 10/27/2008   COPD (chronic obstructive pulmonary disease) (HCC) 10/27/2008   CKD stage 3b, GFR 30-44 ml/min (HCC) 10/27/2008   ICD (implantable cardioverter-defibrillator) in place 10/27/2008   PCP:  [name], MD Pharmacy:   [facility] [place], [place] - [address] [place] [address] Phone: [phone] Fax: [phone]  [facility] [address] [place] [address] Phone: [phone] Fax: [phone]     Social Drivers of Health (SDOH) Social History: SDOH Screenings   Food Insecurity: No Food Insecurity (04/02/2024)  Housing: Low Risk  (04/02/2024)  Transportation Needs: No Transportation Needs (04/02/2024)  Utilities: Not At Risk (04/02/2024)  Alcohol Screen: Low Risk  (11/06/2023)  Depression (PHQ2-9): Low Risk  (03/01/2024)  Financial Resource Strain: Low Risk  (11/06/2023)  Physical Activity: Insufficiently Active (11/06/2023)  Social Connections: Socially Isolated (04/02/2024)  Stress: No Stress Concern Present (11/06/2023)  Tobacco Use: Medium Risk (04/02/2024)  Health Literacy: Adequate Health Literacy (11/06/2023)   SDOH Interventions:     Readmission Risk Interventions    06/17/2023   10:07 AM 01/31/2023    3:53  PM 11/09/2022   12:00 PM  Readmission Risk Prevention Plan  Transportation Screening Complete Complete Complete  PCP or Specialist Appt within 3-5 Days   Complete  HRI or Home Care Consult  Complete Complete  Social Work Consult for Recovery Care Planning/Counseling   Complete  Palliative Care  Screening  Not Applicable   Medication Review Oceanographer)  Complete Complete  HRI or Home Care Consult Complete    SW Recovery Care/Counseling Consult Complete    Palliative Care Screening Not Applicable    Skilled Nursing Facility Not Applicable

## 2024-04-04 NOTE — Progress Notes (Signed)
 Mobility Specialist Progress Note:    04/04/24 1208  Mobility  Activity Pivoted/transferred to/from [facility];Ambulated with assistance (In room after [facility])  Level of Assistance Contact guard assist, steadying assist  Assistive Device Other (Comment) (HHA)  Distance Ambulated (ft) 10 ft  Range of Motion/Exercises Active Assistive  Activity Response Tolerated fair;Other (Comment) (Repeated cueing for breathing)  Mobility Referral Yes  Mobility visit 1 Mobility  Mobility Specialist Start Time (ACUTE ONLY) 1208  Mobility Specialist Stop Time (ACUTE ONLY) 1245  Mobility Specialist Time Calculation (min) (ACUTE ONLY) 37 min   Received pt laying in bed agreeable to session. First requesting assistance to [facility]. Pt able to move, stand, and ambulate w/ CGA. Pt c/o LLE pain and SOB. Pt needing cueing several times for breathing. Pt needing to take several breaks in order to catch breath. Returned pt to bed w/ all needs met.   Venetia Keel Mobility Specialist Please Neurosurgeon or Rehab Office at [phone]

## 2024-04-04 NOTE — Progress Notes (Addendum)
 PROGRESS NOTE  [name] FMW:[id number] DOB: 1933/06/22 DOA: 04/02/2024 PCP: [name], MD   LOS: 2 days   Brief narrative:   Patient is a 89 years old female with past medical history of  COPD on home oxygen  at 2 L/min, congestive heart failure and chronic left lower extremity wound, history of defibrillator and atrial fibrillation not on anticoagulation presented to hospital after sustaining a fall at home.  Patient stated that she rolled from her bed.  Normally uses a walker for ambulation at home and lives with her family.  She also complained of worsening shortness of breath and dyspnea especially on exertion and lying down for the last few days.   In the ED, vitals with notable for blood pressure of 131/56 and was afebrile.  Initial labs were notable for hypokalemia with potassium of 3.2.  Creatinine of 1.5.  Hemoglobin of 9.2.  BNP was elevated at 354.  COVID influenza and RSV was negative.  Chest x-ray showed mild pulmonary edema with diffuse interstitial markings.  Patient was then considered for admission to the hospital for left leg cellulitis with laceration and CHF exacerbation  Assessment/Plan: Active Problems:   Hyperlipidemia   Atrial fibrillation, permanent (HCC)   COPD (chronic obstructive pulmonary disease) (HCC)   CKD stage 3b, GFR 30-44 ml/min (HCC)   ICD (implantable cardioverter-defibrillator) in place   Hypertension   Valvular heart disease   Cellulitis   CHF exacerbation (HCC)  Acute on chronic diastolic CHF exacerbation.   History of pulmonary hypertension not a good candidate for pulmonary vasodilators as per advanced heart failure team.  Continue IV Lasix  80 mg mg twice daily.  Continue strict intake and output charting Daily weights.  Was seen by Dr. Venancio advanced heart failure team as outpatient.  Review of previous 2 D Echocardiogram from 09/06/2023 with last known ejection fraction of left ventricular ejection fraction of 60 to 65% with mild  LVH.  Check 2D echocardiogram.   Patient is negative balance for 1099 mL.  Communicated with cardiology for further management/optimization  Left lower extremity laceration with mild cellulitis on the background of chronic lower extremity wounds..  Needed suturing in the ED with some blood loss.  Has some cellulitis around the leg so on IV Keflex .  No leukocytosis or fever. Will get wound care consult.  Hypomagnesemia.  Improved after magnesium  sulfate.  Latest magnesium  1.9  Hypokalemia.  Improved.  Latest potassium of 4.9   CKD stage IV.  Continue to monitor closely.  Initial creatinine of 1.5.  Creatinine today at 1.4.  Will closely monitor while on IV diuretics.   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  Has had transfusions and IV iron  in the past.  Off anticoagulation at this time.  Will continue to monitor hemoglobin levels.  Latest hemoglobin of 7.6 from initial 8.1.  Will continue to monitor.  Might benefit from IV iron  prior to discharge.  History of atrial fibrillation.  Permanent.  Has failed amiodarone  Tikosyn  and flecainide  in the past and had multiple cardioversions.  Currently on pacemaker.  Not a good anticoagulation candidate.  Rate is controlled at this time.  Due to decompensated heart failure will get cardiology consultation.   Valvular heart disease.  Has severe tricuspid regurgitation and moderate mitral and aortic stenosis.  Not a surgical candidate.  Seen by advanced heart failure team as outpatient.    COPD with chronic hypoxic respiratory failure.  Patient is on 2 L of oxygen  at baseline  at home.  Continue nebulizers, incentive spirometer.   Hypertension.  Will closely monitor blood pressure.     History of neuroendocrine tumor on Sandostatin .  Follows up with oncology Dr Lanny as outpatient. Has had transfusions in the past.    Deconditioning debility, fall.  PT has recommended skilled nursing facility placement at this time  DVT prophylaxis: heparin   injection 5,000 Units Start: 04/02/24 2200   Disposition: Skilled nursing facility as per PT evaluation  Status is: Inpatient Remains inpatient appropriate because: Pending clinical improvement, need for rehabilitation    Code Status:     Code Status: Limited: Do not attempt resuscitation (DNR) -DNR-LIMITED -Do Not Intubate/DNI   Family Communication: None at bedside. Spoke with the patient's daughter on the phone on 04/04/24. Wishes to continue the current level of care.  Consultants: Cardiology  Procedures: Left leg laceration suture in the ED  Anti-infectives:  Cefazolin  IV  Anti-infectives (From admission, onward)    Start     Dose/Rate Route Frequency Ordered Stop   04/04/24 1600  vancomycin  (VANCOCIN ) IVPB 1000 mg/200 mL premix  Status:  Discontinued        1,000 mg 200 mL/hr over 60 Minutes Intravenous Every 48 hours 04/02/24 1824 04/03/24 0846   04/03/24 2200  ceFAZolin  (ANCEF ) IVPB 2g/100 mL premix        2 g 200 mL/hr over 30 Minutes Intravenous Every 12 hours 04/03/24 1258 04/10/24 0959   04/03/24 1000  cefTRIAXone  (ROCEPHIN ) 1 g in sodium chloride  0.9 % 100 mL IVPB  Status:  Discontinued        1 g 200 mL/hr over 30 Minutes Intravenous Every 24 hours 04/02/24 1632 04/03/24 0846   04/03/24 0945  ceFAZolin  (ANCEF ) IVPB 2g/100 mL premix  Status:  Discontinued        2 g 200 mL/hr over 30 Minutes Intravenous Every 8 hours 04/03/24 0846 04/03/24 1258   04/02/24 1645  vancomycin  (VANCOCIN ) IVPB 1000 mg/200 mL premix  Status:  Discontinued        1,000 mg 200 mL/hr over 60 Minutes Intravenous  Once 04/02/24 1632 04/02/24 1633   04/02/24 1400  vancomycin  (VANCOCIN ) IVPB 1000 mg/200 mL premix        1,000 mg 200 mL/hr over 60 Minutes Intravenous  Once 04/02/24 1358 04/02/24 1814   04/02/24 1400  cefTRIAXone  (ROCEPHIN ) 1 g in sodium chloride  0.9 % 100 mL IVPB        1 g 200 mL/hr over 30 Minutes Intravenous  Once 04/02/24 1358 04/02/24 1610        Subjective: Today, patient was seen and examined at bedside.  Patient stated that she feels okay but could not sleep well in the nighttime.  Still has orthopnea and shortness of breath.  Denies any chest pain.  Complains of mild leg pain at the site of the laceration.  Vitals:   04/04/24 0505 04/04/24 0813  BP: (!) 120/48 (!) 121/90  Pulse: 62 65  Resp: 17 16  Temp: 98.3 F (36.8 C) 97.9 F (36.6 C)  SpO2:  100%    Intake/Output Summary (Last 24 hours) at 04/04/2024 1048 Last data filed at 04/04/2024 9372 Gross per 24 hour  Intake 350.6 ml  Output 1200 ml  Net -849.4 ml   Filed Weights   04/04/24 0700  Weight: 62.7 kg   Body mass index is 22.31 kg/m.   Physical Exam:  GENERAL: Patient is alert awake and oriented. Not in obvious distress.  Elderly female, alert awake and  Communicative.,  On nasal cannula oxygen . Frail appearing. HENT: No scleral pallor or icterus. Pupils equally reactive to light. Oral mucosa is moist NECK: is supple, no gross swelling noted. CHEST: Diminished breath sounds bilaterally.   CVS: S1 and S2 heard, no murmur.  Irregular rhythm ABDOMEN: Soft, non-tender, bowel sounds are present. EXTREMITIES: Bilateral lower extremity chronic venous insufficiency, left leg with laceration with dressing CNS: Cranial nerves are intact. Generalized weakness. SKIN: warm and dry without rashes.  Bilateral lower extremity venous insufficiency with edema and left leg with laceration covered with dressing.  Data Review: I have personally reviewed the following laboratory data and studies,  CBC: Recent Labs  Lab 04/02/24 1537 04/03/24 0340 04/04/24 0232  WBC 5.0 4.0 5.5  NEUTROABS 3.6  --   --   HGB 8.1*  9.2* 7.5* 7.6*  HCT 27.2*  27.0* 25.6* 25.4*  MCV 90.1 89.5 88.8  PLT 184 189 214   Basic Metabolic Panel: Recent Labs  Lab 04/02/24 1537 04/03/24 0340 04/04/24 0232  NA 139  141 140 135  K 3.2*  3.2* 3.7 4.9  CL 107  104 106 101  CO2 23 24  24   GLUCOSE 103*  99 111* 109*  BUN 43*  41* 42* 39*  CREATININE 1.27*  1.50* 1.37* 1.45*  CALCIUM  8.6* 8.6* 8.8*  MG  --  1.6* 1.9  PHOS  --  3.4  --    Liver Function Tests: Recent Labs  Lab 04/02/24 1537  AST 35  ALT 19  ALKPHOS 62  BILITOT 0.8  PROT 6.0*  ALBUMIN  3.2*   No results for input(s): LIPASE, AMYLASE in the last 168 hours. No results for input(s): AMMONIA in the last 168 hours. Cardiac Enzymes: Recent Labs  Lab 04/02/24 1537  CKTOTAL 351*   BNP (last 3 results) Recent Labs    06/24/23 1621 07/10/23 1641 04/02/24 1537  BNP 516.7* 322.2* 354.1*    ProBNP (last 3 results) No results for input(s): PROBNP in the last 8760 hours.  CBG: No results for input(s): GLUCAP in the last 168 hours. Recent Results (from the past 240 hours)  Resp panel by RT-PCR (RSV, Flu A&B, Covid) Anterior Nasal Swab     Status: None   Collection Time: 04/02/24  1:57 PM   Specimen: Anterior Nasal Swab  Result Value Ref Range Status   SARS Coronavirus 2 by RT PCR NEGATIVE NEGATIVE Final   Influenza A by PCR NEGATIVE NEGATIVE Final   Influenza B by PCR NEGATIVE NEGATIVE Final    Comment: (NOTE) The Xpert Xpress SARS-CoV-2/FLU/RSV plus assay is intended as an aid in the diagnosis of influenza from Nasopharyngeal swab specimens and should not be used as a sole basis for treatment. Nasal washings and aspirates are unacceptable for Xpert Xpress SARS-CoV-2/FLU/RSV testing.  Fact Sheet for Patients: bloggercourse.com  Fact Sheet for Healthcare Providers: seriousbroker.it  This test is not yet approved or cleared by the [place]  FDA and has been authorized for detection and/or diagnosis of SARS-CoV-2 by FDA under an Emergency Use Authorization (EUA). This EUA will remain in effect (meaning this test can be used) for the duration of the COVID-19 declaration under Section 564(b)(1) of the Act, 21 U.S.C. section  360bbb-3(b)(1), unless the authorization is terminated or revoked.     Resp Syncytial Virus by PCR NEGATIVE NEGATIVE Final    Comment: (NOTE) Fact Sheet for Patients: bloggercourse.com  Fact Sheet for Healthcare Providers: seriousbroker.it  This test is not yet approved or cleared by the  [place]  FDA and has been authorized for detection and/or diagnosis of SARS-CoV-2 by FDA under an Emergency Use Authorization (EUA). This EUA will remain in effect (meaning this test can be used) for the duration of the COVID-19 declaration under Section 564(b)(1) of the Act, 21 U.S.C. section 360bbb-3(b)(1), unless the authorization is terminated or revoked.  Performed at [facility], [address], [place], [place] [address]   Blood culture (routine x 2)     Status: None (Preliminary result)   Collection Time: 04/02/24  1:58 PM   Specimen: BLOOD  Result Value Ref Range Status   Specimen Description BLOOD RIGHT ANTECUBITAL  Final   Special Requests   Final    BOTTLES DRAWN AEROBIC AND ANAEROBIC Blood Culture results may not be optimal due to an inadequate volume of blood received in culture bottles   Culture   Final    NO GROWTH 2 DAYS Performed at [facility], [address] [place], [place] [address]    Report Status PENDING  Incomplete  Blood culture (routine x 2)     Status: None (Preliminary result)   Collection Time: 04/02/24  2:03 PM   Specimen: BLOOD  Result Value Ref Range Status   Specimen Description BLOOD RIGHT ANTECUBITAL  Final   Special Requests   Final    BOTTLES DRAWN AEROBIC AND ANAEROBIC Blood Culture results may not be optimal due to an inadequate volume of blood received in culture bottles   Culture   Final    NO GROWTH 2 DAYS Performed at [facility], [address], [place], [place] [address]    Report Status PENDING  Incomplete     Studies: DG Chest Portable 1 View Result Date:  04/02/2024 EXAM: 1 VIEW(S) XRAY OF THE CHEST 04/02/2024 02:47:00 PM COMPARISON: 09/03/2023 CLINICAL HISTORY: sob FINDINGS: LINES, TUBES AND DEVICES: AICD leads are unchanged and appear in appropriate position. LUNGS AND PLEURA: Mild pulmonary edema with diffuse accentuated interstitial markings. Blunting of the costophrenic angles favoring pleural effusions, right greater than left. No pneumothorax. HEART AND MEDIASTINUM: Cardiomegaly. Calcified aorta. AICD leads are unchanged and appear in appropriate position. BONES AND SOFT TISSUES: Stable benign appearing sclerotic lesion proximal left humerus. No acute osseous abnormality. IMPRESSION: 1. Mild pulmonary edema with diffuse accentuated interstitial markings. 2. Blunting of the costophrenic angles favoring pleural effusions, right greater than left. 3. Cardiomegaly with unchanged AICD leads. Electronically signed by: Ryan Salvage MD 04/02/2024 03:23 PM EST RP Workstation: [id number]   DG Tibia/Fibula Left Port Result Date: 04/02/2024 CLINICAL DATA:  Fall EXAM: PORTABLE LEFT TIBIA AND FIBULA - 2 VIEW COMPARISON:  None Available. FINDINGS: There is soft tissue laceration or ulceration of the anterior lower extremity. There is no underlying periosteal reaction, fracture or dislocation. The bones are diffusely osteopenic. There is diffuse soft tissue swelling of the lower extremity. IMPRESSION: Soft tissue laceration or ulceration of the anterior lower extremity. No underlying periosteal reaction, fracture or dislocation. Electronically Signed   By: Greig Pique M.D.   On: 04/02/2024 15:16      Vernal Alstrom, MD  [facility] 04/04/2024  If 7PM-7AM, please contact night-coverage

## 2024-04-05 DIAGNOSIS — I5033 Acute on chronic diastolic (congestive) heart failure: Secondary | ICD-10-CM | POA: Diagnosis not present

## 2024-04-05 DIAGNOSIS — Z711 Person with feared health complaint in whom no diagnosis is made: Secondary | ICD-10-CM

## 2024-04-05 DIAGNOSIS — Z7189 Other specified counseling: Secondary | ICD-10-CM

## 2024-04-05 DIAGNOSIS — Z515 Encounter for palliative care: Secondary | ICD-10-CM | POA: Diagnosis not present

## 2024-04-05 LAB — BASIC METABOLIC PANEL WITH GFR
Anion gap: 9 (ref 5–15)
BUN: 39 mg/dL — ABNORMAL HIGH (ref 8–23)
CO2: 29 mmol/L (ref 22–32)
Calcium: 9 mg/dL (ref 8.9–10.3)
Chloride: 99 mmol/L (ref 98–111)
Creatinine, Ser: 1.32 mg/dL — ABNORMAL HIGH (ref 0.44–1.00)
GFR, Estimated: 39 mL/min — ABNORMAL LOW
Glucose, Bld: 121 mg/dL — ABNORMAL HIGH (ref 70–99)
Potassium: 4.6 mmol/L (ref 3.5–5.1)
Sodium: 137 mmol/L (ref 135–145)

## 2024-04-05 LAB — CBC
HCT: 24.8 % — ABNORMAL LOW (ref 36.0–46.0)
Hemoglobin: 7.4 g/dL — ABNORMAL LOW (ref 12.0–15.0)
MCH: 26.7 pg (ref 26.0–34.0)
MCHC: 29.8 g/dL — ABNORMAL LOW (ref 30.0–36.0)
MCV: 89.5 fL (ref 80.0–100.0)
Platelets: 211 K/uL (ref 150–400)
RBC: 2.77 MIL/uL — ABNORMAL LOW (ref 3.87–5.11)
RDW: 20.9 % — ABNORMAL HIGH (ref 11.5–15.5)
WBC: 5.2 K/uL (ref 4.0–10.5)
nRBC: 0.4 % — ABNORMAL HIGH (ref 0.0–0.2)

## 2024-04-05 LAB — MAGNESIUM: Magnesium: 1.8 mg/dL (ref 1.7–2.4)

## 2024-04-05 MED ORDER — MEDIHONEY WOUND/BURN DRESSING EX PSTE
1.0000 | PASTE | Freq: Every day | CUTANEOUS | Status: DC
  Administered 2024-04-05 – 2024-04-11 (×6): 1 via TOPICAL
  Filled 2024-04-05 (×2): qty 44

## 2024-04-05 MED ORDER — MAGNESIUM SULFATE 2 GM/50ML IV SOLN
2.0000 g | Freq: Once | INTRAVENOUS | Status: AC
  Administered 2024-04-05: 2 g via INTRAVENOUS
  Filled 2024-04-05: qty 50

## 2024-04-05 NOTE — Progress Notes (Signed)
 PROGRESS NOTE  [name] FMW:[id number] DOB: 1933/11/02 DOA: 04/02/2024 PCP: [name], MD   LOS: 3 days   Brief narrative:   Patient is a 89 years old female with past medical history of  COPD on home oxygen  at 2 L/min, congestive heart failure and chronic left lower extremity wound, history of defibrillator and atrial fibrillation not on anticoagulation presented to hospital after sustaining a fall at home.  Patient stated that she rolled from her bed.  Normally uses a walker for ambulation at home and lives with her family.  She also complained of worsening shortness of breath and dyspnea especially on exertion and lying down for the last few days.   In the ED, vitals with notable for blood pressure of 131/56 and was afebrile.  Initial labs were notable for hypokalemia with potassium of 3.2.  Creatinine of 1.5.  Hemoglobin of 9.2.  BNP was elevated at 354.  COVID influenza and RSV was negative.  Chest x-ray showed mild pulmonary edema with diffuse interstitial markings.  Patient was then considered for admission to the hospital for left leg cellulitis with laceration and CHF exacerbation  Assessment/Plan: Active Problems:   Hyperlipidemia   Atrial fibrillation, permanent (HCC)   COPD (chronic obstructive pulmonary disease) (HCC)   CKD stage 3b, GFR 30-44 ml/min (HCC)   ICD (implantable cardioverter-defibrillator) in place   Hypertension   Valvular heart disease   Severe pulmonary hypertension (HCC)   Cellulitis   CHF exacerbation (HCC)   Aortic valve stenosis   Nonrheumatic mitral valve stenosis   Acute on chronic right-sided heart failure (HCC)  Acute on chronic diastolic CHF exacerbation.   History of pulmonary hypertension not a good candidate for pulmonary vasodilators as per advanced heart failure team.  Continue IV Lasix  80 mg mg twice daily but patient continues to have ongoing symptoms and dyspnea despite diuretic.SABRA  Continue strict intake and output charting  Daily weights.  Was seen by Dr. Venancio advanced heart failure team as outpatient.  Review of previous 2 D Echocardiogram from 09/06/2023 with last known ejection fraction of left ventricular ejection fraction of 60 to 65% with mild LVH.  Repeat 2D echocardiogram from 04/04/2024 showed LV ejection fraction of 55 to 60% with grade 2 diastolic dysfunction with a right ventricular volume overload and severe calcification of aortic valve with moderate aortic valve stenosis small pericardial effusion..   Patient is negative balance for 2 399 mL.  Cardiology has been salted at this time who recommend palliative care or hospice.  Reached out to palliative care services for further evaluation and coordination  Left lower extremity laceration with mild cellulitis on the background of chronic lower extremity wounds..  Needed suturing in the ED with some blood loss.  Has some cellulitis around the leg so on IV Keflex .  No leukocytosis or fever.  Continue wound care.  Hypomagnesemia.  Improved after magnesium  sulfate.  Latest magnesium  18  Hypokalemia.  Improved.  Latest potassium of 4.6   CKD stage IV.  Continue to monitor closely.  Initial creatinine of 1.5.  Creatinine today at 1.3.  Will closely monitor while on IV diuretics.   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  Has had transfusions and IV iron  in the past.  Off anticoagulation at this time.  Will continue to monitor hemoglobin levels.  Latest hemoglobin of 7.4 from 7.6 < 8.1.  Will continue to monitor.  Might benefit from IV iron  depending upon goals of care.   History  of atrial fibrillation.  Permanent.  Has failed amiodarone  Tikosyn  and flecainide  in the past and had multiple cardioversions.  Currently on pacemaker.  Not a good anticoagulation candidate.  Rate is controlled at this time.  Cardiology following  Valvular heart disease.  History of tricuspid regurgitation and moderate mitral and aortic stenosis, pulmonary hypertension..  Not a  surgical candidate.  Seen by advanced heart failure team as outpatient.    COPD with chronic hypoxic respiratory failure.  Patient is on 2 L of oxygen  at baseline at home.  Continue nebulizers, incentive spirometer.   Hypertension.  Will closely monitor blood pressure.     History of neuroendocrine tumor on Sandostatin .  Follows up with oncology Dr Lanny as outpatient. Has had transfusions in the past.    Deconditioning debility, fall frailty, advanced age.  PT has recommended skilled nursing facility placement at this time.  Overall prognosis for the patient is poor.  Goals of care discussion.  Spoke with the patient at length regarding her clinical condition and he has been feel like she wants to continue to be aggressive in her treatment plan.  She feels like palliative care might be appropriate level of care for her.  I also spoke with the patient's daughter and updated her about the clinical condition of the patient.  DVT prophylaxis: heparin  injection 5,000 Units Start: 04/02/24 2200   Disposition: Skilled nursing facility as per PT evaluation  Status is: Inpatient Remains inpatient appropriate because: Pending clinical improvement, need for rehabilitation, goals of care discussion,    Code Status:     Code Status: Limited: Do not attempt resuscitation (DNR) -DNR-LIMITED -Do Not Intubate/DNI   Family Communication:. Spoke with the patient's daughter on the phone on 04/05/2024 and updated her about the clinical condition of the patient.  Consultants: Cardiology  Procedures: Left leg laceration suture in the ED  Anti-infectives:  Cefazolin  IV  Anti-infectives (From admission, onward)    Start     Dose/Rate Route Frequency Ordered Stop   04/04/24 1600  vancomycin  (VANCOCIN ) IVPB 1000 mg/200 mL premix  Status:  Discontinued        1,000 mg 200 mL/hr over 60 Minutes Intravenous Every 48 hours 04/02/24 1824 04/03/24 0846   04/03/24 2200  ceFAZolin  (ANCEF ) IVPB 2g/100 mL premix         2 g 200 mL/hr over 30 Minutes Intravenous Every 12 hours 04/03/24 1258 04/10/24 0959   04/03/24 1000  cefTRIAXone  (ROCEPHIN ) 1 g in sodium chloride  0.9 % 100 mL IVPB  Status:  Discontinued        1 g 200 mL/hr over 30 Minutes Intravenous Every 24 hours 04/02/24 1632 04/03/24 0846   04/03/24 0945  ceFAZolin  (ANCEF ) IVPB 2g/100 mL premix  Status:  Discontinued        2 g 200 mL/hr over 30 Minutes Intravenous Every 8 hours 04/03/24 0846 04/03/24 1258   04/02/24 1645  vancomycin  (VANCOCIN ) IVPB 1000 mg/200 mL premix  Status:  Discontinued        1,000 mg 200 mL/hr over 60 Minutes Intravenous  Once 04/02/24 1632 04/02/24 1633   04/02/24 1400  vancomycin  (VANCOCIN ) IVPB 1000 mg/200 mL premix        1,000 mg 200 mL/hr over 60 Minutes Intravenous  Once 04/02/24 1358 04/02/24 1814   04/02/24 1400  cefTRIAXone  (ROCEPHIN ) 1 g in sodium chloride  0.9 % 100 mL IVPB        1 g 200 mL/hr over 30 Minutes Intravenous  Once 04/02/24 1358  04/02/24 1610       Subjective: Today, patient was seen and examined at bedside.  Patient states that she has been feeling dyspneic and having trouble breathing with feeling of fluid in her lungs despite diuresing.  Denies any pain, nausea, vomiting, fever, chills.  Vitals:   04/04/24 2348 04/05/24 0501  BP: (!) 120/53 (!) 125/51  Pulse: (!) 59 63  Resp: 18 18  Temp: 98.8 F (37.1 C) 98.2 F (36.8 C)  SpO2: 100% 100%    Intake/Output Summary (Last 24 hours) at 04/05/2024 1126 Last data filed at 04/05/2024 0503 Gross per 24 hour  Intake 200 ml  Output 1500 ml  Net -1300 ml   Filed Weights   04/04/24 0700 04/05/24 0500 04/05/24 0641  Weight: 62.7 kg 56.3 kg 55.5 kg   Body mass index is 19.75 kg/m.   Physical Exam:  General: Thinly built, elderly female, appears frail and deconditioned, not in obvious distress but on nasal cannula oxygen  at 2 L/min HENT:   No scleral pallor or icterus noted. Oral mucosa is moist.  Chest: Diminished breath sounds  bilaterally. CVS: S1 &S2 heard. No murmur.  Irregular rhythm Abdomen: Soft, nontender, nondistended.  Bowel sounds are heard.   Extremities: No cyanosis, clubbing with bilateral lower extremity venous insufficiency, left leg with dressing Psych: Alert, awake and oriented, normal mood CNS:  No cranial nerve deficits.  Generalized weakness noted. Skin: Warm and dry.  Bilateral lower extremity venous insufficiency with edema and left leg laceration covered with dressing   Data Review: I have personally reviewed the following laboratory data and studies,  CBC: Recent Labs  Lab 04/02/24 1537 04/03/24 0340 04/04/24 0232 04/05/24 0320  WBC 5.0 4.0 5.5 5.2  NEUTROABS 3.6  --   --   --   HGB 8.1*  9.2* 7.5* 7.6* 7.4*  HCT 27.2*  27.0* 25.6* 25.4* 24.8*  MCV 90.1 89.5 88.8 89.5  PLT 184 189 214 211   Basic Metabolic Panel: Recent Labs  Lab 04/02/24 1537 04/03/24 0340 04/04/24 0232 04/05/24 0320  NA 139  141 140 135 137  K 3.2*  3.2* 3.7 4.9 4.6  CL 107  104 106 101 99  CO2 23 24 24 29   GLUCOSE 103*  99 111* 109* 121*  BUN 43*  41* 42* 39* 39*  CREATININE 1.27*  1.50* 1.37* 1.45* 1.32*  CALCIUM  8.6* 8.6* 8.8* 9.0  MG  --  1.6* 1.9 1.8  PHOS  --  3.4  --   --    Liver Function Tests: Recent Labs  Lab 04/02/24 1537  AST 35  ALT 19  ALKPHOS 62  BILITOT 0.8  PROT 6.0*  ALBUMIN  3.2*   No results for input(s): LIPASE, AMYLASE in the last 168 hours. No results for input(s): AMMONIA in the last 168 hours. Cardiac Enzymes: Recent Labs  Lab 04/02/24 1537  CKTOTAL 351*   BNP (last 3 results) Recent Labs    06/24/23 1621 07/10/23 1641 04/02/24 1537  BNP 516.7* 322.2* 354.1*    ProBNP (last 3 results) No results for input(s): PROBNP in the last 8760 hours.  CBG: No results for input(s): GLUCAP in the last 168 hours. Recent Results (from the past 240 hours)  Resp panel by RT-PCR (RSV, Flu A&B, Covid) Anterior Nasal Swab     Status: None    Collection Time: 04/02/24  1:57 PM   Specimen: Anterior Nasal Swab  Result Value Ref Range Status   SARS Coronavirus 2 by RT PCR  NEGATIVE NEGATIVE Final   Influenza A by PCR NEGATIVE NEGATIVE Final   Influenza B by PCR NEGATIVE NEGATIVE Final    Comment: (NOTE) The Xpert Xpress SARS-CoV-2/FLU/RSV plus assay is intended as an aid in the diagnosis of influenza from Nasopharyngeal swab specimens and should not be used as a sole basis for treatment. Nasal washings and aspirates are unacceptable for Xpert Xpress SARS-CoV-2/FLU/RSV testing.  Fact Sheet for Patients: bloggercourse.com  Fact Sheet for Healthcare Providers: seriousbroker.it  This test is not yet approved or cleared by the [place]  FDA and has been authorized for detection and/or diagnosis of SARS-CoV-2 by FDA under an Emergency Use Authorization (EUA). This EUA will remain in effect (meaning this test can be used) for the duration of the COVID-19 declaration under Section 564(b)(1) of the Act, 21 U.S.C. section 360bbb-3(b)(1), unless the authorization is terminated or revoked.     Resp Syncytial Virus by PCR NEGATIVE NEGATIVE Final    Comment: (NOTE) Fact Sheet for Patients: bloggercourse.com  Fact Sheet for Healthcare Providers: seriousbroker.it  This test is not yet approved or cleared by the [place]  FDA and has been authorized for detection and/or diagnosis of SARS-CoV-2 by FDA under an Emergency Use Authorization (EUA). This EUA will remain in effect (meaning this test can be used) for the duration of the COVID-19 declaration under Section 564(b)(1) of the Act, 21 U.S.C. section 360bbb-3(b)(1), unless the authorization is terminated or revoked.  Performed at [facility], [address] [place], [place] [address]   Blood culture (routine x 2)     Status: None (Preliminary result)   Collection  Time: 04/02/24  1:58 PM   Specimen: BLOOD  Result Value Ref Range Status   Specimen Description BLOOD RIGHT ANTECUBITAL  Final   Special Requests   Final    BOTTLES DRAWN AEROBIC AND ANAEROBIC Blood Culture results may not be optimal due to an inadequate volume of blood received in culture bottles   Culture   Final    NO GROWTH 3 DAYS Performed at [facility], [address] [place], [place] [address]    Report Status PENDING  Incomplete  Blood culture (routine x 2)     Status: None (Preliminary result)   Collection Time: 04/02/24  2:03 PM   Specimen: BLOOD  Result Value Ref Range Status   Specimen Description BLOOD RIGHT ANTECUBITAL  Final   Special Requests   Final    BOTTLES DRAWN AEROBIC AND ANAEROBIC Blood Culture results may not be optimal due to an inadequate volume of blood received in culture bottles   Culture   Final    NO GROWTH 3 DAYS Performed at [facility], [address], [place], [place] [address]    Report Status PENDING  Incomplete     Studies: ECHOCARDIOGRAM COMPLETE Result Date: 04/04/2024    ECHOCARDIOGRAM REPORT   Patient Name:   EMAREE CHIU Date of Exam: 04/04/2024 Medical Rec #:  [id number]         Height:       66.0 in Accession #:    [id number]        Weight:       138.2 lb Date of Birth:  [birth year]         BSA:          1.709 m Patient Age:    89 years          BP:  120/48 mmHg Patient Gender: F                 HR:           70 bpm. Exam Location:  Inpatient Procedure: 2D Echo, Cardiac Doppler and Color Doppler (Both Spectral and Color            Flow Doppler were utilized during procedure). Indications:    I50.40* Unspecified combined systolic (congestive) and diastolic                 (congestive) heart failure  History:        Patient has prior history of Echocardiogram examinations, most                 recent 05/28/2023. CHF and Cardiomyopathy, Defibrillator and                 Abnormal ECG, COPD, Mitral Valve Disease and  Aortic Valve                 Disease, Arrythmias:Bradycardia, Signs/Symptoms:Bacteremia; Risk                 Factors:Hypertension.  Sonographer:    Ellouise Mose RDCS Referring Phys: [id number] [facility] Evgenia Merriman  Sonographer Comments: Technically difficult study due to poor echo windows. Very difficult angle due to patient position on right of bed and could not turn due to leg injury. IMPRESSIONS  1. Left ventricular ejection fraction, by estimation, is 55 to 60%. The left ventricle has normal function. The left ventricle has no regional wall motion abnormalities. Left ventricular diastolic parameters are consistent with Grade III diastolic dysfunction (restrictive). Elevated left atrial pressure. There is the interventricular septum is flattened in systole and diastole, consistent with right ventricular pressure and volume overload.  2. Right ventricular systolic function is moderately reduced. The right ventricular size is normal. There is severely elevated pulmonary artery systolic pressure. The estimated right ventricular systolic pressure is 91.5 mmHg.  3. Right atrial size was moderately dilated.  4. The mitral valve is degenerative. Mild to moderate mitral valve regurgitation. Moderate mitral stenosis. The mean mitral valve gradient is 7.0 mmHg. Moderate mitral annular calcification.  5. Tricuspid valve regurgitation is severe.  6. The aortic valve is calcified. There is severe calcifcation of the aortic valve. There is severe thickening of the aortic valve. Aortic valve regurgitation is mild. Moderate aortic valve stenosis. Aortic valve area, by VTI measures 1.18 cm. Aortic valve mean gradient measures 20.5 mmHg. Aortic valve Vmax measures 3.00 m/s.  7. The inferior vena cava is dilated in size with >50% respiratory variability, suggesting right atrial pressure of 8 mmHg.  8. A small pericardial effusion is present. The pericardial effusion is circumferential. Moderate pleural effusion in the left lateral region.  FINDINGS  Left Ventricle: Left ventricular ejection fraction, by estimation, is 55 to 60%. The left ventricle has normal function. The left ventricle has no regional wall motion abnormalities. The left ventricular internal cavity size was normal in size. There is  no left ventricular hypertrophy. The interventricular septum is flattened in systole and diastole, consistent with right ventricular pressure and volume overload. Left ventricular diastolic parameters are consistent with Grade III diastolic dysfunction (restrictive). Elevated left atrial pressure. Right Ventricle: The right ventricular size is normal. No increase in right ventricular wall thickness. Right ventricular systolic function is moderately reduced. There is severely elevated pulmonary artery systolic pressure. The tricuspid regurgitant velocity is 4.57 m/s, and with an assumed right atrial pressure  of 8 mmHg, the estimated right ventricular systolic pressure is 91.5 mmHg. Left Atrium: Left atrial size was normal in size. Right Atrium: Right atrial size was moderately dilated. Pericardium: A small pericardial effusion is present. The pericardial effusion is circumferential. Mitral Valve: The mitral valve is degenerative in appearance. There is moderate thickening of the mitral valve leaflet(s). Moderate mitral annular calcification. Mild to moderate mitral valve regurgitation. Moderate mitral valve stenosis. MV peak gradient, 22.5 mmHg. The mean mitral valve gradient is 7.0 mmHg. Tricuspid Valve: The tricuspid valve is normal in structure. Tricuspid valve regurgitation is severe. No evidence of tricuspid stenosis. Aortic Valve: The aortic valve is calcified. There is severe calcifcation of the aortic valve. There is severe thickening of the aortic valve. Aortic valve regurgitation is mild. Moderate aortic stenosis is present. Aortic valve mean gradient measures 20.5 mmHg. Aortic valve peak gradient measures 36.1 mmHg. Aortic valve area, by VTI  measures 1.18 cm. Pulmonic Valve: The pulmonic valve was normal in structure. Pulmonic valve regurgitation is mild to moderate. No evidence of pulmonic stenosis. Aorta: The aortic root and ascending aorta are structurally normal, with no evidence of dilitation. Venous: The inferior vena cava is dilated in size with greater than 50% respiratory variability, suggesting right atrial pressure of 8 mmHg. IAS/Shunts: No atrial level shunt detected by color flow Doppler. Additional Comments: A device lead is visualized. There is a moderate pleural effusion in the left lateral region.  LEFT VENTRICLE PLAX 2D LVIDd:         4.30 cm     Diastology LVIDs:         2.80 cm     LV e' medial:    9.14 cm/s LV PW:         1.00 cm     LV E/e' medial:  23.4 LV IVS:        1.10 cm     LV e' lateral:   9.68 cm/s LVOT diam:     2.10 cm     LV E/e' lateral: 22.1 LV SV:         82 LV SV Index:   48 LVOT Area:     3.46 cm  LV Volumes (MOD) LV vol d, MOD A2C: 90.5 ml LV vol d, MOD A4C: 22.7 ml LV vol s, MOD A2C: 17.4 ml LV vol s, MOD A4C: 19.2 ml LV SV MOD A2C:     73.1 ml LV SV MOD A4C:     22.7 ml LV SV MOD BP:      18.1 ml RIGHT VENTRICLE            IVC RV S prime:     9.79 cm/s  IVC diam: 2.60 cm TAPSE (M-mode): 1.1 cm                            PULMONARY VEINS                            Diastolic Velocity: 53.50 cm/s                            S/D Velocity:       1.30                            Systolic Velocity:  69.60 cm/s LEFT ATRIUM  Index        RIGHT ATRIUM           Index LA diam:        4.30 cm 2.52 cm/m   RA Area:     20.60 cm LA Vol (A2C):   46.2 ml 27.03 ml/m  RA Volume:   55.90 ml  32.70 ml/m LA Vol (A4C):   33.4 ml 19.54 ml/m LA Biplane Vol: 41.9 ml 24.51 ml/m  AORTIC VALVE AV Area (Vmax):    1.24 cm AV Area (Vmean):   1.18 cm AV Area (VTI):     1.18 cm AV Vmax:           300.50 cm/s AV Vmean:          213.500 cm/s AV VTI:            0.698 m AV Peak Grad:      36.1 mmHg AV Mean Grad:      20.5 mmHg  LVOT Vmax:         108.00 cm/s LVOT Vmean:        72.600 cm/s LVOT VTI:          0.237 m LVOT/AV VTI ratio: 0.34  AORTA Ao Root diam: 2.80 cm MITRAL VALVE                TRICUSPID VALVE MV Area (PHT): 4.60 cm     TR Peak grad:   83.5 mmHg MV Area VTI:   1.56 cm     TR Vmax:        457.00 cm/s MV Peak grad:  22.5 mmHg MV Mean grad:  7.0 mmHg     SHUNTS MV Vmax:       2.37 m/s     Systemic VTI:  0.24 m MV Vmean:      116.0 cm/s   Systemic Diam: 2.10 cm MV Decel Time: 165 msec MV E velocity: 214.00 cm/s MV A velocity: 98.50 cm/s MV E/A ratio:  2.17 Wilbert Bihari MD Electronically signed by Wilbert Bihari MD Signature Date/Time: 04/04/2024/11:08:14 AM    Final       Vernal Alstrom, MD  [facility] 04/05/2024  If 7PM-7AM, please contact night-coverage

## 2024-04-05 NOTE — Consult Note (Signed)
 Palliative Medicine Inpatient Consult Note  Consulting Provider: Dr. Sonjia  Reason for consult:   Palliative Care Consult Services Palliative Medicine Consult   Symptom Management Consult  Reason for Consult? goal of care discussion   04/05/2024  HPI:  Per intake H&P -->  Patient is a 89 years old female with past medical history of COPD on home oxygen  at 2 L/min, congestive heart failure and chronic left lower extremity wound, history of defibrillator and atrial fibrillation not on anticoagulation presented to hospital after sustaining a fall at home.   Palliative care has been consulted to support additional goals of care conversations in the setting of chronic disease burden.  Clinical Assessment/Goals of Care:  *Please note that this is a verbal dictation therefore any spelling or grammatical errors are due to the Dragon Medical One system interpretation.  I have reviewed medical records including EPIC notes, labs and imaging, received report from bedside RN, assessed the patient who is lying in bed complaining of some shortness of breath.    I met with [name] to further discuss diagnosis prognosis, GOC, EOL wishes, disposition and options.   I introduced Palliative Medicine as specialized medical care for people living with serious illness. It focuses on providing relief from the symptoms and stress of a serious illness. The goal is to improve quality of life for both the patient and the family.  Medical History Review and Understanding:  A review of [name]'s past medical history significant for atrial fibrillation, COPD, congestive heart failure with ICD, hyperlipidemia, chronic kidney disease, and hypertension was completed.  Social History:  [name] shares that she is from [place]  originally though moved to [place], [name]  at the age of 3.  She is a widow.  She shares that she has 2 daughters [name] and [name], 5 grandchildren, and 2  great-grandchildren.  [name] formally worked at [employer] in [place].  For leisure [name] enjoys dancing, going to [employer], going to [employer], and participating in family activities.  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  Functional and Nutritional State:  Preceding hospitalization [name] was living with her 49 year old grandson who is a it sales professional.  She shares that she was able to mobilize with a FWW, take bird baths, change her clothes, and make herself a sandwich and soup.  If patient's grandson was home he would help with additional meal preparation.  [name]'s appetite overall is very little.  Advance Directives:  A detailed discussion was had today regarding advanced directives.  [name] shares that she does not have formal advanced directives.  She has thought about who she would want to make decisions for her and shares that her daughter, [name] is not a good option as she is dealing with some very severe health issues of her own.  She does feel that her daughter [name] is appropriate in terms of planning for the future.  Code Status:  Concepts specific to code status, artifical feeding and hydration, continued IV antibiotics and rehospitalization was had.  The difference between a aggressive medical intervention path  and a palliative comfort care path for this patient at this time was had.   [name] is an established DO NOT RESUSCITATE DO NOT INTUBATE CODE STATUS.  I was able to confirm this with her this morning.  Discussion:  [name] and I discussed the reasons for her acute hospitalization inclusive of her falling in her home.  We reviewed that upon admission it was identified that she was having a  more difficult time breathing therefore she was admitted for heart failure exacerbation.  [name] and I reviewed heart failure and the chronic progressive nature of the disease in accordance with the [place]  Heart Association  classification criteria.  Reviewed that even with medical management the disease over time will progress to the point where he even simple activities become more fatiguing and cause increased shortness of breath.  We reviewed in addition to heart failure the other diseases that [name] is plagued by inclusive of her oncological history.  [name] shares understanding and vocalizes that she has been unable to stay out of the hospital for prolonged periods of time.  She also shares with me her oncological history and how more recently she had had a pancreatic head neoplasm identified.  She shares with me her concerns for the future.  We discussed the options moving forward in terms of care management.  I shared 1 path is to continue current care though I wary with her chronic disease burden and she will likely end up rehospitalized at a frequent cadence each hospitalization having a profound effect on her mental, emotional, and physical health.  We discussed alternatively focusing on comfort and having [name] transition back to her home with hospice care. I described hospice as a service for patients who have a life expectancy of 6 months or less. The goal of hospice is the preservation of dignity and quality at the end phases of life.  Under hospice care, the focus changes from curative to symptom relief.   [name] I discussed having a family meeting with her daughter who does plan to come to the hospital at 1:30 PM today.  [name] shares she would prefer if I do not meet with her daughter and if she has time to speak with her.  I have provided understanding and asked if after she speaks if I could call the follow-up though at this time she would prefer if I do not do that either.  I did ask [name] if I may follow-up with her tomorrow to determine the best path moving forward which she was in agreement towards.  Discussed the importance of continued conversation with family and their  medical providers  regarding overall plan of care and treatment options, ensuring decisions are within the context of the patients values and GOCs.  Decision Maker: [name],[name] (Daughter): [phone] (Mobile)   SUMMARY OF RECOMMENDATIONS   DNAR/DNI  Provided education on disease burden - reviewed more specifically heart failure and its progressive course  We discussed the options inclusive of continued care versus hospice  Patient requests that I do not reach out to her daughter today to further discuss the idea of her transitioning home with hospice, she has requested being able to speak to her daughter first --> I shared understanding and stated that I will follow up tomorrow  Ongoing PMT support  Code Status/Advance Care Planning: DNAR/DNI  Palliative Prophylaxis:  Aspiration, Bowel Regimen, Delirium Protocol, Frequent Pain Assessment, Oral Care, Palliative Wound Care, and Turn Reposition  Additional Recommendations (Limitations, Scope, Preferences): Continue current care  Psycho-social/Spiritual:  Desire for further Chaplaincy support: Yes Additional Recommendations: Education on chronic disease burden   Prognosis: Limited overall  Discharge Planning: To be determined  Vitals:   04/04/24 2348 04/05/24 0501  BP: (!) 120/53 (!) 125/51  Pulse: (!) 59 63  Resp: 18 18  Temp: 98.8 F (37.1 C) 98.2 F (36.8 C)  SpO2: 100% 100%    Intake/Output Summary (Last 24 hours) at  04/05/2024 1031 Last data filed at 04/05/2024 0503 Gross per 24 hour  Intake 200 ml  Output 1500 ml  Net -1300 ml   Last Weight  Most recent update: 04/05/2024  6:43 AM    Weight  55.5 kg (122 lb 5.7 oz)            LABS: CBC:    Component Value Date/Time   WBC 5.2 04/05/2024 0320   HGB 7.4 (L) 04/05/2024 0320   HGB 10.1 (L) 03/17/2024 1409   HGB 8.1 (L) 06/09/2023 1456   HCT 24.8 (L) 04/05/2024 0320   HCT 28.9 (L) 06/09/2023 1456   PLT 211 04/05/2024 0320   PLT 168 03/17/2024 1409   PLT 256 06/09/2023  1456   MCV 89.5 04/05/2024 0320   MCV 84 06/09/2023 1456   NEUTROABS 3.6 04/02/2024 1537   NEUTROABS 3.3 03/22/2019 1415   LYMPHSABS 0.7 04/02/2024 1537   LYMPHSABS 0.6 (L) 03/22/2019 1415   MONOABS 0.6 04/02/2024 1537   EOSABS 0.0 04/02/2024 1537   EOSABS 0.2 03/22/2019 1415   BASOSABS 0.0 04/02/2024 1537   BASOSABS 0.0 03/22/2019 1415   Comprehensive Metabolic Panel:    Component Value Date/Time   NA 137 04/05/2024 0320   NA 139 11/26/2023 1459   K 4.6 04/05/2024 0320   CL 99 04/05/2024 0320   CO2 29 04/05/2024 0320   BUN 39 (H) 04/05/2024 0320   BUN 56 (H) 11/26/2023 1459   CREATININE 1.32 (H) 04/05/2024 0320   CREATININE 2.92 (H) 02/02/2024 1324   CREATININE 2.12 (H) 06/23/2023 1518   GLUCOSE 121 (H) 04/05/2024 0320   CALCIUM  9.0 04/05/2024 0320   AST 35 04/02/2024 1537   AST 15 10/01/2023 1505   ALT 19 04/02/2024 1537   ALT 6 10/01/2023 1505   ALT 23 06/09/2014 1317   ALKPHOS 62 04/02/2024 1537   ALKPHOS 71 06/09/2014 1317   BILITOT 0.8 04/02/2024 1537   BILITOT 0.6 10/01/2023 1505   PROT 6.0 (L) 04/02/2024 1537   PROT 7.2 06/09/2014 1317   ALBUMIN  3.2 (L) 04/02/2024 1537   ALBUMIN  3.8 06/09/2014 1317   Gen:  Frail Elderly Caucasian F chronically ill appearing HEENT: moist mucous membranes CV: Regular rate and irregular rhythm  PULM:  On 5LPM [place], breathing is even and nonlabored ABD: soft/nontender  EXT: BLE edema, venous stasis staning Neuro: Alert and oriented x3   PPS: 40%   This conversation/these recommendations were discussed with patient primary care team, Dr. Sonjia ______________________________________________________ Rosaline Becton [facility] Palliative Medicine Team Team Cell Phone: [phone] Please utilize secure chat with additional questions, if there is no response within 30 minutes please call the above phone number  Total Time: 75 Billing based on MDM: High  Palliative Medicine Team providers are available by phone from 7am  to 7pm daily and can be reached through the team cell phone.  Should this patient require assistance outside of these hours, please call the patient's attending physician.

## 2024-04-05 NOTE — TOC Progression Note (Signed)
 Transition of Care [facility]) - Progression Note    Patient Details  Name: [name] MRN: [id number] Date of Birth: Feb 09, 1934  Transition of Care [facility]) CM/SW Contact  Montie LOISE Louder, [place] Phone Number: 04/05/2024, 3:57 PM  Clinical Narrative:    CSW met with patient and her daughter, Sindhu. CSW introduced self and explained role. Family states they accept bed offer w/[facility]. Nelda expressed concerns about possible co-payments since the patient was recently at [facility]. Per [facility], patient has met out of pocket deductible and would not be responsible for co-payments if insurance approves rehab. Family states they were told they would be responsible.  CSW contacted insurance to determine if the patient will have co-payments- waiting on response.  Family inquired about applying for LTC Medicaid. CSW informed family they can apply on line, call or visit [place] Department of Social Services to apply for [employer] benefits.   TOC will continue to follow and assist with discharge planning.  Montie Louder, MSW, LCSW Clinical Social Worker     Expected Discharge Plan: Skilled Nursing Facility Barriers to Discharge: Continued Medical Work up, English As A Second Language Teacher, SNF Pending bed offer               Expected Discharge Plan and Services In-house Referral: Clinical Social Work   Post Acute Care Choice: Skilled Nursing Facility Living arrangements for the past 2 months: Single Family Home                                       Social Drivers of Health (SDOH) Interventions SDOH Screenings   Food Insecurity: No Food Insecurity (04/02/2024)  Housing: Low Risk  (04/02/2024)  Transportation Needs: No Transportation Needs (04/02/2024)  Utilities: Not At Risk (04/02/2024)  Alcohol Screen: Low Risk  (11/06/2023)  Depression (PHQ2-9): Low Risk  (03/01/2024)  Financial Resource Strain: Low Risk  (11/06/2023)  Physical Activity: Insufficiently  Active (11/06/2023)  Social Connections: Socially Isolated (04/02/2024)  Stress: No Stress Concern Present (11/06/2023)  Tobacco Use: Medium Risk (04/02/2024)  Health Literacy: Adequate Health Literacy (11/06/2023)    Readmission Risk Interventions    06/17/2023   10:07 AM 01/31/2023    3:53 PM 11/09/2022   12:00 PM  Readmission Risk Prevention Plan  Transportation Screening Complete Complete Complete  PCP or Specialist Appt within 3-5 Days   Complete  HRI or Home Care Consult  Complete Complete  Social Work Consult for Recovery Care Planning/Counseling   Complete  Palliative Care Screening  Not Applicable   Medication Review [employer])  Complete Complete  HRI or Home Care Consult Complete    SW Recovery Care/Counseling Consult Complete    Palliative Care Screening Not Applicable    Skilled Nursing Facility Not Applicable

## 2024-04-05 NOTE — Progress Notes (Signed)
 Mobility Specialist Progress Note;   04/05/24 1120  Mobility  Activity Pivoted/transferred from bed to chair  Level of Assistance Minimal assist, patient does 75% or more  Assistive Device Front wheel walker  Distance Ambulated (ft) 5 ft  Activity Response Tolerated well  Mobility Referral Yes  Mobility visit 1 Mobility  Mobility Specialist Start Time (ACUTE ONLY) 1120  Mobility Specialist Stop Time (ACUTE ONLY) 1134  Mobility Specialist Time Calculation (min) (ACUTE ONLY) 14 min   Pt agreeable to mobility w/ encouragement. On 4LO2 upon arrival. Required MinA+2 to safely transfer pt from bed to chair. Pt received pain meds prior to session, pain stable throughout. SPO2 97% on 4LO2. Pt left in chair with all needs met, alarm on.  Lauraine Erm Mobility Specialist Please contact via SecureChat or [employer] [phone]

## 2024-04-05 NOTE — Progress Notes (Signed)
 Heart Failure Navigator Progress Note  Assessed for Heart & Vascular TOC clinic readiness.  Patient does not meet criteria due to she is an Advanced Heart Failure Team patient of Dr. Rolan. .   Navigator will sign off at this time.   Stephane Haddock, BSN, Scientist, clinical (histocompatibility and immunogenetics) Only

## 2024-04-05 NOTE — Consult Note (Addendum)
 WOC Nurse Consult Note: Reason for Consult: Requested to assess wound on L leg. Wound type: full thickness, skin tear due to fall in upper leg. Pressure Injury POA: NA  Addendum: Please give some pain med before dressing changes.  Skin tear Measurement: 12 cm x 6 cm type 1. Wound bed: cover by the flap, bruises, 100% red in the edges. Drainage (amount, consistency, odor) Serosanguinous, minimum amount. Periwound: bruises, fragile skin. Dressing procedure/placement/frequency: Cleanse with saline, pat dry, moist the skin with barrier cream. Apply Mepitel silicone [id number] to the wound bed, protecting the skin tear, top with non adherent pad Tefla. Wrap with conforming stretch bandage. Change daily.  Full thickness - Arterial insufficiency wound (last ABI 0.78). Measurement: 4 cm x 4.5 cm x 0.3 cm. Wound bed: 50% yellow slough, 50% pale red granulation.  Drainage (amount, consistency, odor) serous, minimum amount. Periwound: crusted edge. Dressing procedure/placement/frequency: Cleanse with Vashe [id number], not rinse and allow to dry. Apply Aquacel [id number] to the wound bed, top with non adherent pad Tefla and gauze. Wrap with conforming stretch bandage. Change daily. If the Aquacel is adhere, moist with saline before remove it.  Top of left foot - Full thickness Cover by crust, no able to assess the wound bed, draining a purulent effluent, odor present. Partial removed the crust in the edges with saline. Measurement: 1 cm x 1 cm x 0.2 cm. Dressing procedure/placement/frequency: Cleanse with Vashe [id number], not rinse, pat dry the peri-wound skin. Gently remove the crusts if pt allow. Apply Medihoney to the wound bed, top with foam dressing, change daily.  WOC team will not plan to follow further. Please reconsult if further assistance is needed. Thank-you,  Lela Holm MSN, RN, CNS.  (Phone [phone])

## 2024-04-05 NOTE — Progress Notes (Signed)
  Progress Note  Patient Name: [name] Date of Encounter: 04/05/2024 [facility] Cardiologist: Timothy Gollan, MD   Interval Summary   No acute events overnight.  Feels like breathing is better at rest, still gets quite dyspneic with exertion.  Vital Signs Vitals:   04/04/24 2348 04/05/24 0500 04/05/24 0501 04/05/24 0641  BP: (!) 120/53  (!) 125/51   Pulse: (!) 59  63   Resp: 18  18   Temp: 98.8 F (37.1 C)  98.2 F (36.8 C)   TempSrc: Oral  Oral   SpO2: 100%  100%   Weight:  56.3 kg  55.5 kg    Intake/Output Summary (Last 24 hours) at 04/05/2024 0844 Last data filed at 04/05/2024 0503 Gross per 24 hour  Intake 200 ml  Output 1500 ml  Net -1300 ml      04/05/2024    6:41 AM 04/05/2024    5:00 AM 04/04/2024    7:00 AM  Last 3 Weights  Weight (lbs) 122 lb 5.7 oz 124 lb 1.9 oz 138 lb 3.7 oz  Weight (kg) 55.5 kg 56.3 kg 62.7 kg      Telemetry/ECG  NSR with PVCs - Personally Reviewed  Physical Exam  GEN: No acute distress.   Neck: JVP ~5cm Cardiac: RRR, with 2/6 systolic murmur Respiratory: Coarse GI: Soft, nontender, non-distended  MS: 1+ edema  Assessment & Plan  Acute on chronic diastolic HF (predominantly R-sided) Limited treatment options, not a candidate for T-TEER Continue lasix  80mg  IV BID Continue Zaroxolyn  2.5 three times a week Not a candidate for digoxin, SGLT2i Had a long discussion with patient and we agree that another discussion regarding hospice is appropriate given overall trajectory and lack of treatment options. Severe TR Could be lead related, unclear Not a candidate for intervention Continue diuretics Moderate AS Probably not a major driver of presentation Continue diuretics Moderate MS Not a major driver  Continue diuretics PVCs Will check daily magnesium  and K given diuretic therapy Mg 1.8, replete today: discussed with PharmD K 4.6 Permanent AF Is paced now. Not on A.C   For questions or updates, please  contact [facility] Please consult [email or web address] for contact info under         Signed, Crescent Gotham K Brennan Litzinger, MD

## 2024-04-05 NOTE — Plan of Care (Signed)
  Problem: Clinical Measurements: Goal: Ability to avoid or minimize complications of infection will improve Outcome: Progressing   Problem: Skin Integrity: Goal: Skin integrity will improve Outcome: Progressing   Problem: Education: Goal: Knowledge of General Education information will improve Description: Including pain rating scale, medication(s)/side effects and non-pharmacologic comfort measures Outcome: Progressing   Problem: Health Behavior/Discharge Planning: Goal: Ability to manage health-related needs will improve Outcome: Progressing   Problem: Clinical Measurements: Goal: Ability to maintain clinical measurements within normal limits will improve Outcome: Progressing Goal: Will remain free from infection Outcome: Progressing Goal: Diagnostic test results will improve Outcome: Progressing Goal: Respiratory complications will improve Outcome: Progressing Goal: Cardiovascular complication will be avoided Outcome: Progressing   Problem: Activity: Goal: Risk for activity intolerance will decrease Outcome: Progressing   Problem: Nutrition: Goal: Adequate nutrition will be maintained Outcome: Progressing   Problem: Coping: Goal: Level of anxiety will decrease Outcome: Progressing   Problem: Elimination: Goal: Will not experience complications related to bowel motility Outcome: Progressing Goal: Will not experience complications related to urinary retention Outcome: Progressing   Problem: Pain Managment: Goal: General experience of comfort will improve and/or be controlled Outcome: Progressing   Problem: Safety: Goal: Ability to remain free from injury will improve Outcome: Progressing   Problem: Skin Integrity: Goal: Risk for impaired skin integrity will decrease Outcome: Progressing   Problem: Education: Goal: Knowledge of General Education information will improve Description: Including pain rating scale, medication(s)/side effects and  non-pharmacologic comfort measures Outcome: Progressing   Problem: Health Behavior/Discharge Planning: Goal: Ability to manage health-related needs will improve Outcome: Progressing   Problem: Clinical Measurements: Goal: Ability to maintain clinical measurements within normal limits will improve Outcome: Progressing Goal: Will remain free from infection Outcome: Progressing Goal: Diagnostic test results will improve Outcome: Progressing Goal: Respiratory complications will improve Outcome: Progressing Goal: Cardiovascular complication will be avoided Outcome: Progressing   Problem: Activity: Goal: Risk for activity intolerance will decrease Outcome: Progressing   Problem: Nutrition: Goal: Adequate nutrition will be maintained Outcome: Progressing   Problem: Coping: Goal: Level of anxiety will decrease Outcome: Progressing   Problem: Elimination: Goal: Will not experience complications related to bowel motility Outcome: Progressing Goal: Will not experience complications related to urinary retention Outcome: Progressing   Problem: Pain Managment: Goal: General experience of comfort will improve and/or be controlled Outcome: Progressing   Problem: Safety: Goal: Ability to remain free from injury will improve Outcome: Progressing   Problem: Skin Integrity: Goal: Risk for impaired skin integrity will decrease Outcome: Progressing

## 2024-04-06 DIAGNOSIS — Z515 Encounter for palliative care: Secondary | ICD-10-CM | POA: Diagnosis not present

## 2024-04-06 DIAGNOSIS — I5033 Acute on chronic diastolic (congestive) heart failure: Secondary | ICD-10-CM | POA: Diagnosis not present

## 2024-04-06 DIAGNOSIS — Z7189 Other specified counseling: Secondary | ICD-10-CM | POA: Diagnosis not present

## 2024-04-06 LAB — PHOSPHORUS: Phosphorus: 3.2 mg/dL (ref 2.5–4.6)

## 2024-04-06 LAB — BASIC METABOLIC PANEL WITH GFR
Anion gap: 10 (ref 5–15)
BUN: 42 mg/dL — ABNORMAL HIGH (ref 8–23)
CO2: 31 mmol/L (ref 22–32)
Calcium: 8.9 mg/dL (ref 8.9–10.3)
Chloride: 96 mmol/L — ABNORMAL LOW (ref 98–111)
Creatinine, Ser: 1.42 mg/dL — ABNORMAL HIGH (ref 0.44–1.00)
GFR, Estimated: 36 mL/min — ABNORMAL LOW (ref >60)
Glucose, Bld: 113 mg/dL — ABNORMAL HIGH (ref 70–99)
Potassium: 5 mmol/L (ref 3.5–5.1)
Sodium: 137 mmol/L (ref 135–145)

## 2024-04-06 LAB — CBC
HCT: 24.4 % — ABNORMAL LOW (ref 36.0–46.0)
Hemoglobin: 7.4 g/dL — ABNORMAL LOW (ref 12.0–15.0)
MCH: 27.2 pg (ref 26.0–34.0)
MCHC: 30.3 g/dL (ref 30.0–36.0)
MCV: 89.7 fL (ref 80.0–100.0)
Platelets: 190 K/uL (ref 150–400)
RBC: 2.72 MIL/uL — ABNORMAL LOW (ref 3.87–5.11)
RDW: 20.4 % — ABNORMAL HIGH (ref 11.5–15.5)
WBC: 5.3 K/uL (ref 4.0–10.5)
nRBC: 0 % (ref 0.0–0.2)

## 2024-04-06 LAB — MAGNESIUM: Magnesium: 2.4 mg/dL (ref 1.7–2.4)

## 2024-04-06 MED ORDER — CEPHALEXIN 500 MG PO CAPS
500.0000 mg | ORAL_CAPSULE | Freq: Two times a day (BID) | ORAL | Status: DC
  Administered 2024-04-06 – 2024-04-12 (×12): 500 mg via ORAL
  Filled 2024-04-06 (×15): qty 1

## 2024-04-06 MED ORDER — EPOETIN ALFA-EPBX 10000 UNIT/ML IJ SOLN
10000.0000 [IU] | Freq: Once | INTRAMUSCULAR | Status: DC
  Filled 2024-04-06: qty 1

## 2024-04-06 MED ORDER — EPOETIN ALFA-EPBX 10000 UNIT/ML IJ SOLN: 10000.0000 [IU] | Freq: Once | INTRAMUSCULAR | Status: DC

## 2024-04-06 MED ORDER — EPOETIN ALFA 10000 UNIT/ML IJ SOLN
10000.0000 [IU] | Freq: Once | INTRAMUSCULAR | Status: AC
  Administered 2024-04-07: 10000 [IU] via INTRAVENOUS
  Filled 2024-04-06 (×2): qty 1

## 2024-04-06 NOTE — Progress Notes (Signed)
 Physical Therapy Treatment Patient Details Name: [name] MRN: [id number] DOB: 01-08-1934 Today's Date: 04/06/2024   History of Present Illness Pt is a 89 y.o female admitted for fall and SOB 11/28. Admitted for LLE cellulitis with laceration and CHF exacerbation. PMH: PVD, pancreatic CA on chemotherapy, HRpEF, CKD-III, hx of nephrectomy, squamous cell carcinoma, HLD, pacemaker, COPD 2L    PT Comments  Pt received sitting EOB and agreeable to limited session with encouragement. Pt reports dizziness all day today and increased fatigue. Pt on 2L throughout session with inconsistent pleth, however noted to drop as low as 70's intermittently. Pt able to stand and perform static standing marches with heavy reliance on BUE support to offload LLE. Pt requires redirection back to task intermittently due to internal distraction. Pt continues to benefit from PT services to progress toward functional mobility goals.    If plan is discharge home, recommend the following: A little help with walking and/or transfers;A little help with bathing/dressing/bathroom;Assistance with cooking/housework;Assist for transportation;Help with stairs or ramp for entrance   Can travel by private vehicle     Yes  Equipment Recommendations  None recommended by PT    Recommendations for Other Services       Precautions / Restrictions Precautions Precautions: Fall Recall of Precautions/Restrictions: Impaired Restrictions Weight Bearing Restrictions Per Provider Order: No     Mobility  Bed Mobility Overal bed mobility: Needs Assistance Bed Mobility: Sit to Supine       Sit to supine: Contact guard assist   General bed mobility comments: Pt able to lift BLE to EOB without assist    Transfers Overall transfer level: Needs assistance Equipment used: Rolling walker (2 wheels) Transfers: Sit to/from Stand Sit to Stand: Contact guard assist           General transfer comment: increased time for  rise and CGA for safety    Ambulation/Gait         Gait velocity: dec     General Gait Details: Pt declined due to dizziness and fatigue   Stairs             Wheelchair Mobility     Tilt Bed    Modified Rankin (Stroke Patients Only)       Balance Overall balance assessment: Needs assistance Sitting-balance support: No upper extremity supported, Feet supported Sitting balance-Leahy Scale: Good Sitting balance - Comments: EOB   Standing balance support: Reliant on assistive device for balance, During functional activity, Bilateral upper extremity supported Standing balance-Leahy Scale: Poor Standing balance comment: Reliant on RW for LLE offloading                            Communication Communication Communication: No apparent difficulties  Cognition Arousal: Alert Behavior During Therapy: WFL for tasks assessed/performed   PT - Cognitive impairments: No apparent impairments                         Following commands: Intact      Cueing Cueing Techniques: Verbal cues  Exercises General Exercises - Lower Extremity Hip Flexion/Marching: AROM, Standing, Both, 10 reps    General Comments General comments (skin integrity, edema, etc.): Pt on 2L with inconsistent pleth throughout, but noted as low as 74% with quick increase back to >90%. 99% in supine at end of session. Pt reports dizziness throughout that increased slightly in standing. BP 124/57 sitting EOB  Pertinent Vitals/Pain Pain Assessment Pain Assessment: Faces Faces Pain Scale: Hurts even more Pain Location: LLE Pain Descriptors / Indicators: Burning, Grimacing, Guarding Pain Intervention(s): Limited activity within patient's tolerance, Monitored during session, Repositioned     PT Goals (current goals can now be found in the care plan section) Acute Rehab PT Goals Patient Stated Goal: Get well, reduce pain PT Goal Formulation: With patient Time For Goal  Achievement: 04/17/24 Progress towards PT goals: Progressing toward goals    Frequency    Min 2X/week       AM-PAC PT 6 Clicks Mobility   Outcome Measure  Help needed turning from your back to your side while in a flat bed without using bedrails?: A Little Help needed moving from lying on your back to sitting on the side of a flat bed without using bedrails?: A Little Help needed moving to and from a bed to a chair (including a wheelchair)?: A Little Help needed standing up from a chair using your arms (e.g., wheelchair or bedside chair)?: A Little Help needed to walk in hospital room?: A Little Help needed climbing 3-5 steps with a railing? : A Lot 6 Click Score: 17    End of Session Equipment Utilized During Treatment: Oxygen  Activity Tolerance: Patient limited by fatigue;Other (comment) (dizziness) Patient left: with call bell/phone within reach;in bed;with bed alarm set;with nursing/sitter in room Nurse Communication: Mobility status PT Visit Diagnosis: Unsteadiness on feet (R26.81);Other abnormalities of gait and mobility (R26.89);Muscle weakness (generalized) (M62.81);History of falling (Z91.81);Difficulty in walking, not elsewhere classified (R26.2);Pain Pain - Right/Left: Left Pain - part of body: Leg     Time: 1435-1457 PT Time Calculation (min) (ACUTE ONLY): 22 min  Charges:    $Therapeutic Activity: 8-22 mins PT General Charges $$ ACUTE PT VISIT: 1 Visit                    Darryle George, PTA Acute Rehabilitation Services Secure Chat Preferred  [phone]    Darryle George 04/06/2024, 3:49 PM

## 2024-04-06 NOTE — Progress Notes (Signed)
  Progress Note  Patient Name: [name] Date of Encounter: 04/06/2024 [facility] Cardiologist: Timothy Gollan, MD   Interval Summary   No acute events overnight.  Feels like breathing is better at rest, still gets quite dyspneic with exertion.  Vital Signs Vitals:   04/06/24 0500 04/06/24 0600 04/06/24 0734 04/06/24 0920  BP:   (!) 136/119 114/70  Pulse: 62 (!) 57 64 (!) 59  Resp: (!) 22 20 19 18   Temp:   97.9 F (36.6 C)   TempSrc:   Oral   SpO2: (!) 89% 90% 92% 92%  Weight: 55.4 kg       Intake/Output Summary (Last 24 hours) at 04/06/2024 0924 Last data filed at 04/06/2024 0600 Gross per 24 hour  Intake 203 ml  Output 2100 ml  Net -1897 ml      04/06/2024    5:00 AM 04/05/2024    6:41 AM 04/05/2024    5:00 AM  Last 3 Weights  Weight (lbs) 122 lb 2.2 oz 122 lb 5.7 oz 124 lb 1.9 oz  Weight (kg) 55.4 kg 55.5 kg 56.3 kg      Telemetry/ECG  NSR with PVCs - Personally Reviewed  Physical Exam  GEN: No acute distress.   Neck: JVP ~5cm Cardiac: RRR, with 2/6 systolic murmur Respiratory: Coarse GI: Soft, nontender, non-distended  MS: 1+ edema  Assessment & Plan  Acute on chronic diastolic HF (predominantly R-sided) Limited treatment options, not a candidate for T-TEER Continue lasix  80mg  IV BID Continue Zaroxolyn  2.5 three times a week Not a candidate for digoxin, SGLT2i Had a long discussion with patient and we agree that another discussion regarding hospice is appropriate given overall trajectory and lack of treatment options. Severe TR Could be lead related, unclear Not a candidate for intervention Continue diuretics Moderate AS Probably not a major driver of presentation Continue diuretics Moderate MS Not a major driver  Continue diuretics PVCs Will check daily magnesium  and K given diuretic therapy Mg 1.8, replete today: discussed with PharmD K 4.6 Permanent AF Is paced now. Not on A.C   For questions or updates, please contact  [facility] Please consult [email or web address] for contact info under         Signed, Akshay Spang K Harpreet Pompey, MD

## 2024-04-06 NOTE — Progress Notes (Signed)
 Palliative Medicine Inpatient Follow Up Note HPI: Patient is a 89 years old female with past medical history of COPD on home oxygen  at 2 L/min, congestive heart failure and chronic left lower extremity wound, history of defibrillator and atrial fibrillation not on anticoagulation presented to hospital after sustaining a fall at home.    Palliative care has been consulted to support additional goals of care conversations in the setting of chronic disease burden.  Today's Discussion 04/06/2024  *Please note that this is a verbal dictation therefore any spelling or grammatical errors are due to the Dragon Medical One system interpretation.  I reviewed the chart notes including nursing notes from today, progress notes from today. I also reviewed vital signs, nursing flowsheets, medication administrations record, labs, and imaging.    I met with [name] this morning. She is awake and alert. At this point in time she feels that she does have a good understanding of her disease burden. She does not feel ready for hospice care at this time. Her greatest concern is that of placement as her great grandson is rarely in their home.  [name] does have understanding of her heart failure and it's severity. She does seem to have a good idea of her limited life expectancy. She however would like to continue current modalities of care. Created space and opportunity for patient to explore thoughts feelings and fears regarding current medical situation.  I was able to call and speak to patients daughter, [name] this morning. She and I reviewed that she spend the greater part of the day with [facility] yesterday. She understands both Palliative care and Hospice care. She shares like above her mother right now is wanting to continue present measures of care. [name] shares hospice especially in the home is not an option as [name] needs 24/7 around the clock support at this juncture. She is hopeful that [name] can get to  [employer] and qualify for medicaid evolving into a long term care resident.   Questions and concerns addressed/Palliative Support Provided.   Objective Assessment: Vital Signs Vitals:   04/06/24 0920 04/06/24 0939  BP: 114/70 (!) 136/48  Pulse: (!) 59 68  Resp: 18 20  Temp:    SpO2: 92% 100%    Intake/Output Summary (Last 24 hours) at 04/06/2024 9044 Last data filed at 04/06/2024 0600 Gross per 24 hour  Intake 203 ml  Output 2100 ml  Net -1897 ml   Last Weight  Most recent update: 04/06/2024  5:46 AM    Weight  55.4 kg (122 lb 2.2 oz)            Gen:  Frail Elderly Caucasian F chronically ill appearing HEENT: moist mucous membranes CV: Regular rate and irregular rhythm  PULM:  On RA, breathing is even and nonlabored ABD: soft/nontender  EXT: BLE edema, venous stasis staning Neuro: Alert and oriented x3   SUMMARY OF RECOMMENDATIONS   DNAR/DNI  Continue current care allowing time for outcomes  Patient and her family have a good understanding of her acute on chronic disease burden  TOC working on SNF placement, has informed patient and her family on how to obtain medicaid  The PMT will remain to be involved during hospitalization ______________________________________________________________________________________ [name] Palliative Medicine Team Team Cell Phone: [phone] Please utilize secure chat with additional questions, if there is no response within 30 minutes please call the above phone number  Time Spent: 45 Billing based on MDM:  High  Palliative Medicine Team providers  are available by phone from 7am to 7pm daily and can be reached through the team cell phone.  Should this patient require assistance outside of these hours, please call the patient's attending physician.

## 2024-04-06 NOTE — Progress Notes (Addendum)
 Progress Note  Patient Name: [name] Date of Encounter: 04/06/2024 [facility] Cardiologist: Evalene Lunger, MD   Interval Summary    Sitting up in bed, reports a fullness in her abd under her ribs. Breathing is about the same.   Vital Signs Vitals:   04/06/24 0500 04/06/24 0600 04/06/24 0734 04/06/24 0920  BP:   (!) 136/119 114/70  Pulse: 62 (!) 57 64 (!) 59  Resp: (!) 22 20 19 18   Temp:   97.9 F (36.6 C)   TempSrc:   Oral   SpO2: (!) 89% 90% 92% 92%  Weight: 55.4 kg       Intake/Output Summary (Last 24 hours) at 04/06/2024 0931 Last data filed at 04/06/2024 0600 Gross per 24 hour  Intake 203 ml  Output 2100 ml  Net -1897 ml      04/06/2024    5:00 AM 04/05/2024    6:41 AM 04/05/2024    5:00 AM  Last 3 Weights  Weight (lbs) 122 lb 2.2 oz 122 lb 5.7 oz 124 lb 1.9 oz  Weight (kg) 55.4 kg 55.5 kg 56.3 kg      Telemetry/ECG   Sinus Rhythm, PVCs - Personally Reviewed  Physical Exam  GEN: No acute distress, on [place] Neck: No JVD Cardiac: RRR, 3/6 systolic murmur across the precordium, no rubs, or gallops.  Respiratory: Clear to auscultation bilaterally. GI: Soft, nontender, non-distended  MS: No edema  Assessment & Plan   89 y.o. 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 Sonjia.   Acute on Chronic Diastolic Heart Failure  Remote NICM s/p MDT ICD -- 11/30 echo showed EF EF 55-60%, grade III DD, moderately reduced RV systolic function, severely elevated PA systolic pressure, mild-moderate MR, moderate MS, severe TR, mild AI, moderate AS  -- had been on torsemide  100 mg daily. Recently, this was increased to 100  mg BID and metolazone  2.5 mg was added 3 days per week by advanced heart failure  -- presented to the ED after a fall. Reported shortness of breath, orthopnea, lower extremity swelling  -- BNP 354, CXR showed mild pulmonary edema -- on IV lasix  80mg  BID, metolazone  2.5mg  daily every other day. Net - 4L, weight 138>>122.14 since admission. Cr stable. Continue diuresis  Valvular Disease  -- echo this admission showed mild-moderate MR, moderate MS, severe TR, mild AI, moderate AS. Severe TR possibly lead related? -- Patient not a candidate for TEER. Continue diuresis as above    Pulmonary HTN  -- Followed by advanced heart failure. Not a candidate for pulmonary vasodilators    Permanent atrial fibrillation  -- HR controlled without rate controlling medications, paced -- not on OAC 2/2 hx of GI bleeds    Otherwise per primary  L lower extremity laceration with cellulitis  CKD stage IV  Chronic anemia  Pancreatic neuroendocrine tumor  COPD with chronic hypoxic respiratory failure  Deconditioning, fall   For questions or updates, please contact [facility] Please consult [email or web address] for contact info under   Signed, Manuelita Rummer, NP    ATTENDING ATTESTATION:  After conducting a review of all available clinical information with the care team, interviewing the patient, and performing a  physical exam, I agree with the findings and plan described in this note.   GEN: No acute distress, AO x 3 HEENT:  MMM, no JVD, no scleral icterus Cardiac: RRR, 3/6 holosystolic murmur, increases with expiration Respiratory: Coarse GI: Soft, nontender, non-distended  MS: No edema; No deformity. Neuro:  Nonfocal  Vasc:  +2 radial pulses  Patient diuresed well overnight.  Does not feel well, unable to specify why.  No real options for patient other than medical therapy.  I spoke to patient and daughter about palliation which I think is appropriate.  Would recommend continuing discussions  on this issue.  Continue IV lasix  and change to PO on discharge.  Will sign off, call with ?s.  Lurena Red, MD Pager [phone]

## 2024-04-06 NOTE — Plan of Care (Signed)
  Problem: Clinical Measurements: Goal: Ability to avoid or minimize complications of infection will improve Outcome: Progressing   Problem: Skin Integrity: Goal: Skin integrity will improve Outcome: Progressing   Problem: Education: Goal: Knowledge of General Education information will improve Description: Including pain rating scale, medication(s)/side effects and non-pharmacologic comfort measures Outcome: Progressing   Problem: Health Behavior/Discharge Planning: Goal: Ability to manage health-related needs will improve Outcome: Progressing   Problem: Clinical Measurements: Goal: Ability to maintain clinical measurements within normal limits will improve Outcome: Progressing Goal: Will remain free from infection Outcome: Progressing Goal: Diagnostic test results will improve Outcome: Progressing Goal: Respiratory complications will improve Outcome: Progressing Goal: Cardiovascular complication will be avoided Outcome: Progressing   Problem: Activity: Goal: Risk for activity intolerance will decrease Outcome: Progressing   Problem: Nutrition: Goal: Adequate nutrition will be maintained Outcome: Progressing   Problem: Coping: Goal: Level of anxiety will decrease Outcome: Progressing   Problem: Elimination: Goal: Will not experience complications related to bowel motility Outcome: Progressing Goal: Will not experience complications related to urinary retention Outcome: Progressing   Problem: Pain Managment: Goal: General experience of comfort will improve and/or be controlled Outcome: Progressing   Problem: Safety: Goal: Ability to remain free from injury will improve Outcome: Progressing   Problem: Skin Integrity: Goal: Risk for impaired skin integrity will decrease Outcome: Progressing   Problem: Education: Goal: Knowledge of General Education information will improve Description: Including pain rating scale, medication(s)/side effects and  non-pharmacologic comfort measures Outcome: Progressing   Problem: Health Behavior/Discharge Planning: Goal: Ability to manage health-related needs will improve Outcome: Progressing   Problem: Clinical Measurements: Goal: Ability to maintain clinical measurements within normal limits will improve Outcome: Progressing Goal: Will remain free from infection Outcome: Progressing Goal: Diagnostic test results will improve Outcome: Progressing Goal: Respiratory complications will improve Outcome: Progressing Goal: Cardiovascular complication will be avoided Outcome: Progressing   Problem: Activity: Goal: Risk for activity intolerance will decrease Outcome: Progressing   Problem: Nutrition: Goal: Adequate nutrition will be maintained Outcome: Progressing   Problem: Coping: Goal: Level of anxiety will decrease Outcome: Progressing   Problem: Elimination: Goal: Will not experience complications related to bowel motility Outcome: Progressing Goal: Will not experience complications related to urinary retention Outcome: Progressing   Problem: Pain Managment: Goal: General experience of comfort will improve and/or be controlled Outcome: Progressing   Problem: Safety: Goal: Ability to remain free from injury will improve Outcome: Progressing   Problem: Skin Integrity: Goal: Risk for impaired skin integrity will decrease Outcome: Progressing

## 2024-04-06 NOTE — Progress Notes (Signed)
 PROGRESS NOTE  [name] FMW:[id number] DOB: 1933/12/27 DOA: 04/02/2024 PCP: [name], MD   LOS: 4 days   Brief narrative:   Patient is a 89 years old female with past medical history of  COPD on home oxygen  at 2 L/min, congestive heart failure and chronic left lower extremity wound, history of defibrillator and atrial fibrillation not on anticoagulation presented to the hospital after sustaining a fall at home.  Patient stated that she rolled from her bed.  Normally uses a walker for ambulation at home and lives with her family.  She also complained of worsening shortness of breath and dyspnea especially on exertion and lying down for few days prior to presentation..   In the ED, vitals with notable for blood pressure of 131/56 and was afebrile.  Initial labs were notable for hypokalemia with potassium of 3.2.  Creatinine of 1.5.  Hemoglobin of 9.2.  BNP was elevated at 354.  COVID influenza and RSV was negative.  Chest x-ray showed mild pulmonary edema with diffuse interstitial markings.  Patient was then considered for admission to the hospital for left leg cellulitis with laceration and CHF exacerbation  Assessment/Plan: Active Problems:   Hyperlipidemia   Atrial fibrillation, permanent (HCC)   COPD (chronic obstructive pulmonary disease) (HCC)   CKD stage 3b, GFR 30-44 ml/min (HCC)   ICD (implantable cardioverter-defibrillator) in place   Hypertension   Valvular heart disease   Severe pulmonary hypertension (HCC)   Cellulitis   CHF exacerbation (HCC)   Aortic valve stenosis   Nonrheumatic mitral valve stenosis   Acute on chronic right-sided heart failure (HCC)  Acute on chronic diastolic CHF exacerbation.   History of pulmonary hypertension not a good candidate for pulmonary vasodilators as per advanced heart failure team.  Continue IV Lasix  80 mg IV twice daily with metolazone  2.5 mg 3 days/week.  But patient continues to have ongoing symptoms and dyspnea despite  diuretic.  Continue strict intake and output charting Daily weights.  Patient had been seen by Dr. Venancio advanced heart failure team as outpatient.  Review of previous 2 D Echocardiogram from 09/06/2023 with last known ejection fraction of left ventricular ejection fraction of 60 to 65% with mild LVH.  Repeat 2D echocardiogram from 04/04/2024 showed LV ejection fraction of 55 to 60% with grade 2 diastolic dysfunction with a right ventricular volume overload and severe calcification of aortic valve with moderate aortic valve stenosis small pericardial effusion..   Patient is negative balance for 4056 mL with urinary output of 2100 mL over the last 24 hours.  Cardiology has seen the patient at this time and recommend palliative care or hospice.  Palliative care has been consulted and at this time patient wishes to continue current level of care.  Left lower extremity laceration with mild cellulitis on the background of chronic lower extremity wounds..  Needed suturing in the ED with some blood loss.  Has some cellulitis around the leg so on IV Keflex .  Will transition to p.o. Keflex  from today.  No leukocytosis or fever.  Continue wound care.  Hypomagnesemia.  Improved after magnesium  sulfate.  Latest magnesium  of 2.4  Hypokalemia.  Improved.  Latest potassium of 5.0   CKD stage IV.  Continue to monitor closely.  Initial creatinine of 1.5.  Creatinine today at 1.4.  Will closely monitor while on IV diuretics.   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  Has had transfusions and IV iron  in the past.  Off anticoagulation at  this time.  Will continue to monitor hemoglobin levels.  Latest hemoglobin of 7.4 from 7.6 < 8.1.  Will continue to monitor.  Ferritin level was 129, 2 weeks back.  History of atrial fibrillation.  Permanent.  Has failed amiodarone  Tikosyn  and flecainide  in the past and had multiple cardioversions.  Currently on pacemaker.  Not a good anticoagulation candidate.  Rate is  controlled at this time.  Cardiology following.  Currently on IV diuretics.  Valvular heart disease.  History of tricuspid regurgitation and moderate mitral and aortic stenosis, pulmonary hypertension..  Not a surgical candidate.  Seen by advanced heart failure team as outpatient.    COPD with chronic hypoxic respiratory failure.  Patient is on 2 L of oxygen  at baseline at home.  Continue nebulizers, incentive spirometer.   Essential hypertension.  Will closely monitor blood pressure.     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  Follows up with oncology Dr Lanny as outpatient. Has had transfusions in the past.    Deconditioning, debility, fall frailty, advanced age.  PT has recommended skilled nursing facility placement at this time.  Overall prognosis of patient is poor.  Goals of care discussion.  Palliative care has been consulted.  At this time patient wishes to continue current level of care.  DVT prophylaxis: heparin  injection 5,000 Units Start: 04/02/24 2200   Disposition: Skilled nursing facility as per PT evaluation  Status is: Inpatient Remains inpatient appropriate because: Pending clinical improvement, need for rehabilitation, IV diuretics     Code Status:     Code Status: Limited: Do not attempt resuscitation (DNR) -DNR-LIMITED -Do Not Intubate/DNI   Family Communication:.  Spoke with the patient's daughter on the phone on 04/05/2024 and updated her about the clinical condition of the patient.  Consultants: Cardiology Palliative care  Procedures: Left leg laceration suture in the ED  Anti-infectives:  Cefazolin  IV-will change to Keflex    Subjective: Today, patient was seen and examined at bedside.  Patient still has some fullness in the stomach with some dyspnea and shortness of breath.  Wishes to go to skilled nursing facility after discharge  Vitals:   04/06/24 0920 04/06/24 0939  BP: 114/70 (!) 136/48  Pulse: (!) 59 68  Resp: 18 20   Temp:    SpO2: 92% 100%    Intake/Output Summary (Last 24 hours) at 04/06/2024 1019 Last data filed at 04/06/2024 0600 Gross per 24 hour  Intake 203 ml  Output 2100 ml  Net -1897 ml   Filed Weights   04/05/24 0500 04/05/24 0641 04/06/24 0500  Weight: 56.3 kg 55.5 kg 55.4 kg   Body mass index is 19.71 kg/m.   Physical Exam:  General: Thinly built, elderly female, appears frail and deconditioned, not in obvious distress but on nasal cannula oxygen  at 2 L/min HENT:   No scleral pallor or icterus noted. Oral mucosa is moist.  Chest: Diminished breath sounds bilaterally.  Coarse breath sounds noted. CVS: S1 &S2 heard. No murmur.  Irregular rhythm Abdomen: Soft, nontender, nondistended.  Bowel sounds are heard.   Extremities: No cyanosis, clubbing with bilateral lower extremity venous insufficiency, left leg with dressing Psych: Alert, awake and oriented, normal mood CNS:  No cranial nerve deficits.  Generalized weakness noted. Skin: Warm and dry.  Bilateral lower extremity venous insufficiency with edema and left leg laceration covered with dressing   Data Review: I have personally reviewed the following laboratory data and studies,  CBC: Recent Labs  Lab 04/02/24 1537 04/03/24 0340 04/04/24  9767 04/05/24 0320 04/06/24 0339  WBC 5.0 4.0 5.5 5.2 5.3  NEUTROABS 3.6  --   --   --   --   HGB 8.1*  9.2* 7.5* 7.6* 7.4* 7.4*  HCT 27.2*  27.0* 25.6* 25.4* 24.8* 24.4*  MCV 90.1 89.5 88.8 89.5 89.7  PLT 184 189 214 211 190   Basic Metabolic Panel: Recent Labs  Lab 04/02/24 1537 04/03/24 0340 04/04/24 0232 04/05/24 0320 04/06/24 0339  NA 139  141 140 135 137 137  K 3.2*  3.2* 3.7 4.9 4.6 5.0  CL 107  104 106 101 99 96*  CO2 23 24 24 29 31   GLUCOSE 103*  99 111* 109* 121* 113*  BUN 43*  41* 42* 39* 39* 42*  CREATININE 1.27*  1.50* 1.37* 1.45* 1.32* 1.42*  CALCIUM  8.6* 8.6* 8.8* 9.0 8.9  MG  --  1.6* 1.9 1.8 2.4  PHOS  --  3.4  --   --  3.2   Liver Function  Tests: Recent Labs  Lab 04/02/24 1537  AST 35  ALT 19  ALKPHOS 62  BILITOT 0.8  PROT 6.0*  ALBUMIN  3.2*   No results for input(s): LIPASE, AMYLASE in the last 168 hours. No results for input(s): AMMONIA in the last 168 hours. Cardiac Enzymes: Recent Labs  Lab 04/02/24 1537  CKTOTAL 351*   BNP (last 3 results) Recent Labs    06/24/23 1621 07/10/23 1641 04/02/24 1537  BNP 516.7* 322.2* 354.1*    ProBNP (last 3 results) No results for input(s): PROBNP in the last 8760 hours.  CBG: No results for input(s): GLUCAP in the last 168 hours. Recent Results (from the past 240 hours)  Resp panel by RT-PCR (RSV, Flu A&B, Covid) Anterior Nasal Swab     Status: None   Collection Time: 04/02/24  1:57 PM   Specimen: Anterior Nasal Swab  Result Value Ref Range Status   SARS Coronavirus 2 by RT PCR NEGATIVE NEGATIVE Final   Influenza A by PCR NEGATIVE NEGATIVE Final   Influenza B by PCR NEGATIVE NEGATIVE Final    Comment: (NOTE) The Xpert Xpress SARS-CoV-2/FLU/RSV plus assay is intended as an aid in the diagnosis of influenza from Nasopharyngeal swab specimens and should not be used as a sole basis for treatment. Nasal washings and aspirates are unacceptable for Xpert Xpress SARS-CoV-2/FLU/RSV testing.  Fact Sheet for Patients: bloggercourse.com  Fact Sheet for Healthcare Providers: seriousbroker.it  This test is not yet approved or cleared by the [place]  FDA and has been authorized for detection and/or diagnosis of SARS-CoV-2 by FDA under an Emergency Use Authorization (EUA). This EUA will remain in effect (meaning this test can be used) for the duration of the COVID-19 declaration under Section 564(b)(1) of the Act, 21 U.S.C. section 360bbb-3(b)(1), unless the authorization is terminated or revoked.     Resp Syncytial Virus by PCR NEGATIVE NEGATIVE Final    Comment: (NOTE) Fact Sheet for  Patients: bloggercourse.com  Fact Sheet for Healthcare Providers: seriousbroker.it  This test is not yet approved or cleared by the [place]  FDA and has been authorized for detection and/or diagnosis of SARS-CoV-2 by FDA under an Emergency Use Authorization (EUA). This EUA will remain in effect (meaning this test can be used) for the duration of the COVID-19 declaration under Section 564(b)(1) of the Act, 21 U.S.C. section 360bbb-3(b)(1), unless the authorization is terminated or revoked.  Performed at [facility], [address] [place], [place] [address]   Blood  culture (routine x 2)     Status: None (Preliminary result)   Collection Time: 04/02/24  1:58 PM   Specimen: BLOOD  Result Value Ref Range Status   Specimen Description BLOOD RIGHT ANTECUBITAL  Final   Special Requests   Final    BOTTLES DRAWN AEROBIC AND ANAEROBIC Blood Culture results may not be optimal due to an inadequate volume of blood received in culture bottles   Culture   Final    NO GROWTH 4 DAYS Performed at [facility], [address], [place], [place] [address]    Report Status PENDING  Incomplete  Blood culture (routine x 2)     Status: None (Preliminary result)   Collection Time: 04/02/24  2:03 PM   Specimen: BLOOD  Result Value Ref Range Status   Specimen Description BLOOD RIGHT ANTECUBITAL  Final   Special Requests   Final    BOTTLES DRAWN AEROBIC AND ANAEROBIC Blood Culture results may not be optimal due to an inadequate volume of blood received in culture bottles   Culture   Final    NO GROWTH 4 DAYS Performed at [facility], [address], [place], [place] [address]    Report Status PENDING  Incomplete     Studies: No results found.     Vernal Alstrom, MD  [facility] 04/06/2024  If 7PM-7AM, please contact night-coverage

## 2024-04-06 NOTE — Care Management Important Message (Signed)
 Important Message  Patient Details  Name: CHARLESE GRUETZMACHER MRN: [id number] Date of Birth: 22-May-1933   Important Message Given:  Yes - Medicare IM     Jennie Laneta Dragon 04/06/2024, 11:46 AM

## 2024-04-06 NOTE — TOC Progression Note (Signed)
 Transition of Care [facility]) - Progression Note    Patient Details  Name: [name] MRN: [id number] Date of Birth: [birth year]  [facility] CM/SW Contact  Montie LOISE Louder, [place] Phone Number: 04/06/2024, 11:32 AM  Clinical Narrative:     Emmalene Hertz confirmed if patient is approved by insurance for short term rehab, she will not have a copayment.   CSW spoke with the patient's daughter, Tasmia, explained patient will not have copayment at [facility] but will need insurance approval before she can d/c  once she stable. CSW encourage family to have back plan incase insurance denies SNF. Kajol states understanding.  Montie Louder, MSW, LCSW Clinical Social Worker      Expected Discharge Plan: Skilled Nursing Facility Barriers to Discharge: Continued Medical Work up, English As A Second Language Teacher, SNF Pending bed offer               Expected Discharge Plan and Services In-house Referral: Clinical Social Work   Post Acute Care Choice: Skilled Nursing Facility Living arrangements for the past 2 months: Single Family Home                                       Social Drivers of Health (SDOH) Interventions SDOH Screenings   Food Insecurity: No Food Insecurity (04/02/2024)  Housing: Low Risk  (04/02/2024)  Transportation Needs: No Transportation Needs (04/02/2024)  Utilities: Not At Risk (04/02/2024)  Alcohol Screen: Low Risk  (11/06/2023)  Depression (PHQ2-9): Low Risk  (03/01/2024)  Financial Resource Strain: Low Risk  (11/06/2023)  Physical Activity: Insufficiently Active (11/06/2023)  Social Connections: Socially Isolated (04/02/2024)  Stress: No Stress Concern Present (11/06/2023)  Tobacco Use: Medium Risk (04/02/2024)  Health Literacy: Adequate Health Literacy (11/06/2023)    Readmission Risk Interventions    06/17/2023   10:07 AM 01/31/2023    3:53 PM 11/09/2022   12:00 PM  Readmission Risk Prevention Plan  Transportation Screening Complete Complete  Complete  PCP or Specialist Appt within 3-5 Days   Complete  HRI or Home Care Consult  Complete Complete  Social Work Consult for Recovery Care Planning/Counseling   Complete  Palliative Care Screening  Not Applicable   Medication Review Oceanographer)  Complete Complete  HRI or Home Care Consult Complete    SW Recovery Care/Counseling Consult Complete    Palliative Care Screening Not Applicable    Skilled Nursing Facility Not Applicable

## 2024-04-06 NOTE — Progress Notes (Signed)
 Patient complaint of dizziness and chest tightness all over the chest, denied any radiating pain, V/S were stable, 12 lead EKG showed V paced rhythm, notified to MD, POC continues.    04/06/24 0939  Vitals  BP (!) 136/48  MAP (mmHg) 75  BP Location Left Arm  BP Method Automatic  Patient Position (if appropriate) Lying  Pulse Rate 68  Pulse Rate Source Monitor  ECG Heart Rate 65  Resp 20  Level of Consciousness  Level of Consciousness Alert  MEWS COLOR  MEWS Score Color Green  Oxygen  Therapy  SpO2 100 %  O2 Device Room Air  MEWS Score  MEWS Temp 0  MEWS Systolic 0  MEWS Pulse 0  MEWS RR 0  MEWS LOC 0  MEWS Score 0

## 2024-04-07 DIAGNOSIS — I5033 Acute on chronic diastolic (congestive) heart failure: Secondary | ICD-10-CM | POA: Diagnosis not present

## 2024-04-07 LAB — CBC
HCT: 24.3 % — ABNORMAL LOW (ref 36.0–46.0)
Hemoglobin: 7.3 g/dL — ABNORMAL LOW (ref 12.0–15.0)
MCH: 26.7 pg (ref 26.0–34.0)
MCHC: 30 g/dL (ref 30.0–36.0)
MCV: 89 fL (ref 80.0–100.0)
Platelets: 217 K/uL (ref 150–400)
RBC: 2.73 MIL/uL — ABNORMAL LOW (ref 3.87–5.11)
RDW: 20.2 % — ABNORMAL HIGH (ref 11.5–15.5)
WBC: 4.5 K/uL (ref 4.0–10.5)
nRBC: 0 % (ref 0.0–0.2)

## 2024-04-07 LAB — BASIC METABOLIC PANEL WITH GFR
Anion gap: 12 (ref 5–15)
BUN: 45 mg/dL — ABNORMAL HIGH (ref 8–23)
CO2: 33 mmol/L — ABNORMAL HIGH (ref 22–32)
Calcium: 8.8 mg/dL — ABNORMAL LOW (ref 8.9–10.3)
Chloride: 92 mmol/L — ABNORMAL LOW (ref 98–111)
Creatinine, Ser: 1.39 mg/dL — ABNORMAL HIGH (ref 0.44–1.00)
GFR, Estimated: 36 mL/min — ABNORMAL LOW (ref >60)
Glucose, Bld: 106 mg/dL — ABNORMAL HIGH (ref 70–99)
Potassium: 4.8 mmol/L (ref 3.5–5.1)
Sodium: 137 mmol/L (ref 135–145)

## 2024-04-07 LAB — CULTURE, BLOOD (ROUTINE X 2)
Culture: NO GROWTH
Culture: NO GROWTH

## 2024-04-07 LAB — MAGNESIUM: Magnesium: 2.1 mg/dL (ref 1.7–2.4)

## 2024-04-07 LAB — PHOSPHORUS: Phosphorus: 3.1 mg/dL (ref 2.5–4.6)

## 2024-04-07 MED ORDER — SPIRONOLACTONE 12.5 MG HALF TABLET
12.5000 mg | ORAL_TABLET | Freq: Every day | ORAL | Status: DC
  Administered 2024-04-07 – 2024-04-09 (×3): 12.5 mg via ORAL
  Filled 2024-04-07 (×3): qty 1

## 2024-04-07 MED ORDER — SODIUM CHLORIDE 0.9 % IV SOLN
400.0000 mg | INTRAVENOUS | Status: AC
  Administered 2024-04-07 – 2024-04-08 (×2): 400 mg via INTRAVENOUS
  Filled 2024-04-07 (×2): qty 20

## 2024-04-07 NOTE — TOC Progression Note (Addendum)
 Transition of Care [facility]) - Progression Note    Patient Details  Name: [name] MRN: [id number] Date of Birth: 04/29/1934  Transition of [facility]) CM/SW Contact  Luise JAYSON Pan, [place] Phone Number: 04/07/2024, 11:05 AM  Clinical Narrative:  Per MD, patient to potentially be medically stable in 1-2 days.  CSW submitted for insurance auth with Jori form HTA. CSW submitted auth for [facility] and [facility]. CSW notified patients daughter, [name], and facility.   [name] stated she is applying for Medicaid for patient.   CSW will continue to follow.   Expected Discharge Plan: Skilled Nursing Facility Barriers to Discharge: Continued Medical Work up, English As A Second Language Teacher, SNF Pending bed offer               Expected Discharge Plan and Services In-house Referral: Clinical Social Work   Post Acute Care Choice: Skilled Nursing Facility Living arrangements for the past 2 months: Single Family Home                                       Social Drivers of Health (SDOH) Interventions SDOH Screenings   Food Insecurity: No Food Insecurity (04/02/2024)  Housing: Low Risk  (04/02/2024)  Transportation Needs: No Transportation Needs (04/02/2024)  Utilities: Not At Risk (04/02/2024)  Alcohol Screen: Low Risk  (11/06/2023)  Depression (PHQ2-9): Low Risk  (03/01/2024)  Financial Resource Strain: Low Risk  (11/06/2023)  Physical Activity: Insufficiently Active (11/06/2023)  Social Connections: Socially Isolated (04/02/2024)  Stress: No Stress Concern Present (11/06/2023)  Tobacco Use: Medium Risk (04/02/2024)  Health Literacy: Adequate Health Literacy (11/06/2023)    Readmission Risk Interventions    06/17/2023   10:07 AM 01/31/2023    3:53 PM 11/09/2022   12:00 PM  Readmission Risk Prevention Plan  Transportation Screening Complete Complete Complete  PCP or Specialist Appt within 3-5 Days   Complete  HRI or Home Care Consult  Complete Complete  Social Work Consult for  Recovery Care Planning/Counseling   Complete  Palliative Care Screening  Not Applicable   Medication Review Oceanographer)  Complete Complete  HRI or Home Care Consult Complete    SW Recovery Care/Counseling Consult Complete    Palliative Care Screening Not Applicable    Skilled Nursing Facility Not Applicable

## 2024-04-07 NOTE — Progress Notes (Signed)
 PROGRESS NOTE  [name] FMW:[id number] DOB: Mar 12, 1934 DOA: 04/02/2024 PCP: [name], MD   LOS: 5 days   Brief narrative:  89/F with COPD on home O2, chronic diastolic CHF, RV failure, chronic leg wounds, ICD, paroxysmal A-fib not on anticoagulation presenting to the ED with weakness, shortness of breath, dyspnea on exertion, in the ER she was volume overloaded, labs noted hypokalemia, creatinine 1.5, hemoglobin 9.2, BNP 354, chest x-ray noted mild pulmonary edema and diffuse interstitial markings - Admitted started on diuretics and antibiotics for cellulitis   Assessment/Plan:  Acute on chronic diastolic CHF exacerbation.   RV Failure History of pulmonary hypertension - Followed by advanced heart failure team as outpatient, not a candidate for pulmonary vasodilators  -Repeat 2D echo this admission noted EF 55-60%, grade 3 DD, moderately reduced RV, severely elevated PASP of 91, moderate aortic stenosis - Cards following, recommended palliative care and signed off , patient declines hospice services at this time, wishes to discharge to rehab when improved - Continue IV Lasix  1 more day, switch to oral diuretics tomorrow, add low-dose Aldactone   Left lower extremity laceration with mild cellulitis on the background of chronic lower extremity wounds..  Needed suturing in the ED with some blood loss.  Noted to have some cellulitis changes treated with IV and SF, changed to oral Keflex , continue for 3 more days  Hypomagnesemia.  Repleted  Hypokalemia.  Improved.  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  Has had transfusions and IV iron  in the past.  Off anticoagulation at this time.  Recent anemia panel with iron  deficiency - Hemoglobin down to 7.3, repeat iron   Permanent atrial fibrillation.   Has failed amiodarone  Tikosyn  and flecainide  in the past and had multiple cardioversions.  Currently  on pacemaker.  Not a good anticoagulation candidate.  Rate is controlled at this time.    Valvular heart disease.  History of tricuspid regurgitation and moderate mitral and aortic stenosis, pulmonary hypertension..  Not a surgical candidate.  Seen by advanced heart failure team as outpatient.    COPD with chronic hypoxic respiratory failure.  Patient is on 2 L of oxygen  at baseline at home.  Continue nebulizers, incentive spirometer.   Essential hypertension.  Will closely monitor blood pressure.     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  Follows up with oncology Dr Lanny as outpatient. Has had transfusions in the past.    Deconditioning, debility, fall frailty, advanced age.  PT has recommended skilled nursing facility placement at this time.  Overall prognosis of patient is poor.  Goals of care discussion.  Palliative care has been consulted.  At this time patient wishes to continue current level of care.  DVT prophylaxis: heparin  injection 5,000 Units Start: 04/02/24 2200   Disposition: Skilled nursing facility as per PT evaluation Code Status:     Code Status: Limited: Do not attempt resuscitation (DNR) -DNR-LIMITED -Do Not Intubate/DNI  Family Communication:.  No family at bedside, will update daughter Consultants: Cardiology Palliative care  Procedures: Left leg laceration suture in the ED    Subjective: Feels fair, breathing not at baseline  Vitals:   04/06/24 2000 04/06/24 2352  BP:  112/66  Pulse: 65 (!) 59  Resp: 15 17  Temp:  98.3 F (36.8 C)  SpO2: 100% 100%    Intake/Output Summary (Last 24 hours) at 04/07/2024 0801 Last data filed at 04/06/2024 2100 Gross  per 24 hour  Intake 460 ml  Output 1300 ml  Net -840 ml   Filed Weights   04/06/24 0500 04/06/24 1933 04/07/24 0420  Weight: 55.4 kg 55.4 kg 55.5 kg   Body mass index is 19.75 kg/m.   Physical Exam:  General: Chronically ill frail and debilitated female laying in bed HEENT:  Positive JVD CVS: S1-S2, irregular rhythm Lungs: Poor air movement bilaterally, few coarse rhonchi Abdomen: Soft, nontender, bowel sounds present Remedies: Trace edema, chronic skin changes, left leg shin with dressing  Data Review: I have personally reviewed the following laboratory data and studies,  CBC: Recent Labs  Lab 04/02/24 1537 04/03/24 0340 04/04/24 0232 04/05/24 0320 04/06/24 0339 04/07/24 0259  WBC 5.0 4.0 5.5 5.2 5.3 4.5  NEUTROABS 3.6  --   --   --   --   --   HGB 8.1*  9.2* 7.5* 7.6* 7.4* 7.4* 7.3*  HCT 27.2*  27.0* 25.6* 25.4* 24.8* 24.4* 24.3*  MCV 90.1 89.5 88.8 89.5 89.7 89.0  PLT 184 189 214 211 190 217   Basic Metabolic Panel: Recent Labs  Lab 04/03/24 0340 04/04/24 0232 04/05/24 0320 04/06/24 0339 04/07/24 0259  NA 140 135 137 137 137  K 3.7 4.9 4.6 5.0 4.8  CL 106 101 99 96* 92*  CO2 24 24 29 31  33*  GLUCOSE 111* 109* 121* 113* 106*  BUN 42* 39* 39* 42* 45*  CREATININE 1.37* 1.45* 1.32* 1.42* 1.39*  CALCIUM  8.6* 8.8* 9.0 8.9 8.8*  MG 1.6* 1.9 1.8 2.4 2.1  PHOS 3.4  --   --  3.2 3.1   Liver Function Tests: Recent Labs  Lab 04/02/24 1537  AST 35  ALT 19  ALKPHOS 62  BILITOT 0.8  PROT 6.0*  ALBUMIN  3.2*   No results for input(s): LIPASE, AMYLASE in the last 168 hours. No results for input(s): AMMONIA in the last 168 hours. Cardiac Enzymes: Recent Labs  Lab 04/02/24 1537  CKTOTAL 351*   BNP (last 3 results) Recent Labs    06/24/23 1621 07/10/23 1641 04/02/24 1537  BNP 516.7* 322.2* 354.1*    ProBNP (last 3 results) No results for input(s): PROBNP in the last 8760 hours.  CBG: No results for input(s): GLUCAP in the last 168 hours. Recent Results (from the past 240 hours)  Resp panel by RT-PCR (RSV, Flu A&B, Covid) Anterior Nasal Swab     Status: None   Collection Time: 04/02/24  1:57 PM   Specimen: Anterior Nasal Swab  Result Value Ref Range Status   SARS Coronavirus 2 by RT PCR NEGATIVE NEGATIVE Final    Influenza A by PCR NEGATIVE NEGATIVE Final   Influenza B by PCR NEGATIVE NEGATIVE Final    Comment: (NOTE) The Xpert Xpress SARS-CoV-2/FLU/RSV plus assay is intended as an aid in the diagnosis of influenza from Nasopharyngeal swab specimens and should not be used as a sole basis for treatment. Nasal washings and aspirates are unacceptable for Xpert Xpress SARS-CoV-2/FLU/RSV testing.  Fact Sheet for Patients: bloggercourse.com  Fact Sheet for Healthcare Providers: seriousbroker.it  This test is not yet approved or cleared by the [place]  FDA and has been authorized for detection and/or diagnosis of SARS-CoV-2 by FDA under an Emergency Use Authorization (EUA). This EUA will remain in effect (meaning this test can be used) for the duration of the COVID-19 declaration under Section 564(b)(1) of the Act, 21 U.S.C. section 360bbb-3(b)(1), unless the authorization is terminated or revoked.     Resp Syncytial  Virus by PCR NEGATIVE NEGATIVE Final    Comment: (NOTE) Fact Sheet for Patients: bloggercourse.com  Fact Sheet for Healthcare Providers: seriousbroker.it  This test is not yet approved or cleared by the [place]  FDA and has been authorized for detection and/or diagnosis of SARS-CoV-2 by FDA under an Emergency Use Authorization (EUA). This EUA will remain in effect (meaning this test can be used) for the duration of the COVID-19 declaration under Section 564(b)(1) of the Act, 21 U.S.C. section 360bbb-3(b)(1), unless the authorization is terminated or revoked.  Performed at [facility], [address] [place], [place] [address]   Blood culture (routine x 2)     Status: None (Preliminary result)   Collection Time: 04/02/24  1:58 PM   Specimen: BLOOD  Result Value Ref Range Status   Specimen Description BLOOD RIGHT ANTECUBITAL  Final   Special Requests   Final     BOTTLES DRAWN AEROBIC AND ANAEROBIC Blood Culture results may not be optimal due to an inadequate volume of blood received in culture bottles   Culture   Final    NO GROWTH 4 DAYS Performed at [facility], [address] [place], [place] [address]    Report Status PENDING  Incomplete  Blood culture (routine x 2)     Status: None (Preliminary result)   Collection Time: 04/02/24  2:03 PM   Specimen: BLOOD  Result Value Ref Range Status   Specimen Description BLOOD RIGHT ANTECUBITAL  Final   Special Requests   Final    BOTTLES DRAWN AEROBIC AND ANAEROBIC Blood Culture results may not be optimal due to an inadequate volume of blood received in culture bottles   Culture   Final    NO GROWTH 4 DAYS Performed at [facility], [address] [place], [place] [address]    Report Status PENDING  Incomplete     Studies: No results found.     Sigurd Pac, MD  [facility] 04/07/2024  If 7PM-7AM, please contact night-coverage

## 2024-04-07 NOTE — Progress Notes (Signed)
 Palliative Medicine Inpatient Follow Up Note HPI: Patient is a 89 years old female with past medical history of COPD on home oxygen  at 2 L/min, congestive heart failure and chronic left lower extremity wound, history of defibrillator and atrial fibrillation not on anticoagulation presented to hospital after sustaining a fall at home.    Palliative care has been consulted to support additional goals of care conversations in the setting of chronic disease burden.  Today's Discussion 04/07/2024  *Please note that this is a verbal dictation therefore any spelling or grammatical errors are due to the Dragon Medical One system interpretation.  I reviewed the chart notes including nursing notes from today, progress notes from today. I also reviewed vital signs, nursing flowsheets, medication administrations record, labs, and imaging.    I met with [name] at bedside this morning. She and I discussed her goals moving forward. Her greatest goal is to be in a facility that can address her complex medical needs and 24/7 supportive care. She and I discussed her disease burden and poor overall prognosis. She does assert understanding. At this time she is hopeful to get to [facility] to see if she can gain strength. She is also hopeful for long term placement as her grandson is not able to care for her in her home consistently.   Allowed time for [name] to reflect on her current health. Described the difference between SNF, palliative support, and hospice:  Skilled care is nursing and therapy care that can only be safely and effectively performed by, or under the supervision of, professionals or engineer, maintenance. It's health care given when you need skilled nursing or skilled therapy to treat, manage, and observe your condition, and evaluate your care. In a skilled nursing facility you'll receive one or more therapies for an average of one to two hours per day. This includes physical, occupational, and  speech therapy. The therapies are not considered intensive.   Palliative care is specialized medical care for people living with a serious illness, such as cancer, heart failure, COPD, Alzheimer's dementia, etc. Patients in palliative care may receive medical care for their symptoms, or palliative care, along with treatment intended to cure their serious illness   Hospice care focuses on the care, comfort, and quality of life of a person with a serious illness who is approaching the end of life. At some point, it may not be possible to cure a serious illness, or a patient may choose not to undergo certain treatments. Hospice is designed for this situation.  Plan at this time will be to continue present measures with the hope of improvement(s). Patient does express if not improved or re-hospitalized in a short period of time the idea of potential transition to hospice care.  Patient does agree to outpatient palliative support at this time.   Questions and concerns addressed/Palliative Support Provided.   Objective Assessment: Vital Signs Vitals:   04/07/24 0725 04/07/24 1107  BP: (!) 105/46 (!) 154/49  Pulse: (!) 59   Resp: 20 20  Temp: 97.6 F (36.4 C)   SpO2: 97%     Intake/Output Summary (Last 24 hours) at 04/07/2024 1208 Last data filed at 04/06/2024 2100 Gross per 24 hour  Intake 220 ml  Output 1300 ml  Net -1080 ml   Last Weight  Most recent update: 04/07/2024  4:21 AM    Weight  55.5 kg (122 lb 5.7 oz)            Gen:  Frail  Elderly Caucasian F chronically ill appearing HEENT: moist mucous membranes CV: Regular rate and irregular rhythm  PULM:  On RA, breathing is even and nonlabored ABD: soft/nontender  EXT: BLE edema, venous stasis staning Neuro: Alert and oriented x3   SUMMARY OF RECOMMENDATIONS   DNAR/DNI  Continue current care allowing time for outcomes  Patient and her family have a good understanding of her acute on chronic disease burden  TOC working  on SNF placement, has informed patient and her family on how to obtain medicaid  OP Palliative care on discharge  The PMT will remain to be involved during hospitalization ______________________________________________________________________________________ [name] [place] Palliative Medicine Team Team Cell Phone: [phone] Please utilize secure chat with additional questions, if there is no response within 30 minutes please call the above phone number  Time Spent: 50 Billing based on MDM:  High  Palliative Medicine Team providers are available by phone from 7am to 7pm daily and can be reached through the team cell phone.  Should this patient require assistance outside of these hours, please call the patient's attending physician.

## 2024-04-07 NOTE — Progress Notes (Signed)
 Occupational Therapy Treatment Patient Details Name: [name] MRN: [id number] DOB: [birth year] Today's Date: 04/07/2024   History of present illness Pt is a 89 y.o female admitted for fall and SOB 11/28. Admitted for LLE cellulitis with laceration and CHF exacerbation. PMH: PVD, pancreatic CA on chemotherapy, HRpEF, CKD-III, hx of nephrectomy, squamous cell carcinoma, HLD, pacemaker, COPD 2L   OT comments  Pt progressing towards goals. Improved OOB tolerance for ADLs. Pt completed bathing, dressing, and grooming tasks at sink tolerating ~55 minutes of OOB activity. Limited by decreased activity tolerance, requiring most activities to  be completed seated. Continue to recommend <3 hours of skilled rehab daily to optimize independence levels. Will continue to follow acutely.       If plan is discharge home, recommend the following:  A little help with walking and/or transfers;A little help with bathing/dressing/bathroom;Assistance with cooking/housework;Assist for transportation   Equipment Recommendations  Other (comment) (defer to next venue)       Precautions / Restrictions Precautions Precautions: Fall Recall of Precautions/Restrictions: Impaired Restrictions Weight Bearing Restrictions Per Provider Order: No       Mobility Bed Mobility Overal bed mobility: Needs Assistance Bed Mobility: Supine to Sit, Sit to Supine     Supine to sit: Min assist, HOB elevated     General bed mobility comments: Min HH assist for trunl    Transfers Overall transfer level: Needs assistance Equipment used: Rolling walker (2 wheels) Transfers: Sit to/from Stand Sit to Stand: From elevated surface, Contact guard assist           General transfer comment: Cues for hand placement, difficulty sequencing task for inital STS from low bed height. Rest of STS from elevated BSC height also CGA     Balance Overall balance assessment: Needs assistance Sitting-balance support: No upper  extremity supported, Feet supported Sitting balance-Leahy Scale: Good     Standing balance support: Reliant on assistive device for balance, During functional activity, Bilateral upper extremity supported Standing balance-Leahy Scale: Poor Standing balance comment: Reliant on RW         ADL either performed or assessed with clinical judgement   ADL Overall ADL's : Needs assistance/impaired     Grooming: Set up;Sitting   Upper Body Bathing: Set up;Sitting   Lower Body Bathing: Contact guard assist;Sit to/from stand   Upper Body Dressing : Set up;Sitting       Toilet Transfer: Contact guard assist;Ambulation;BSC/3in1;Rolling walker (2 wheels) Toilet Transfer Details (indicate cue type and reason): Limited to short distance Toileting- Clothing Manipulation and Hygiene: Contact guard assist;Sit to/from stand;Sitting/lateral lean       Functional mobility during ADLs: Contact guard assist;Rolling walker (2 wheels) General ADL Comments: CGA for balance, limited by decreased activity tolerance. Required primarily seated activities    Extremity/Trunk Assessment Upper Extremity Assessment Upper Extremity Assessment: Generalized weakness   Lower Extremity Assessment Lower Extremity Assessment: Defer to PT evaluation        Vision   Vision Assessment?: No apparent visual deficits         Communication Communication Communication: No apparent difficulties   Cognition Arousal: Alert Behavior During Therapy: WFL for tasks assessed/performed Cognition: Cognition impaired       Memory impairment (select all impairments): Short-term memory   Executive functioning impairment (select all impairments): Problem solving OT - Cognition Comments: Likely baseline deficits       Following commands: Intact        Cueing   Cueing Techniques: Verbal cues  General Comments Pt reporting dizziness and fatigue throughout session    Pertinent Vitals/ Pain       Pain  Assessment Pain Assessment: Faces Faces Pain Scale: Hurts even more Pain Location: LLE Pain Descriptors / Indicators: Constant, Grimacing, Discomfort Pain Intervention(s): Limited activity within patient's tolerance   Frequency  Min 2X/week        Progress Toward Goals  OT Goals(current goals can now be found in the care plan section)  Progress towards OT goals: Progressing toward goals  Acute Rehab OT Goals Patient Stated Goal: To feel better OT Goal Formulation: With patient Time For Goal Achievement: 04/17/24 ADL Goals Pt Will Perform Grooming: with supervision;standing Pt Will Perform Lower Body Dressing: with supervision;sit to/from stand Pt Will Transfer to Toilet: with supervision;ambulating;regular height toilet Pt Will Perform Toileting - Clothing Manipulation and hygiene: with supervision;sit to/from stand;sitting/lateral leans  Plan         AM-PAC OT 6 Clicks Daily Activity     Outcome Measure   Help from another person eating meals?: None Help from another person taking care of personal grooming?: A Little Help from another person toileting, which includes using toliet, bedpan, or urinal?: A Little Help from another person bathing (including washing, rinsing, drying)?: A Little Help from another person to put on and taking off regular upper body clothing?: A Little Help from another person to put on and taking off regular lower body clothing?: A Little 6 Click Score: 19    End of Session Equipment Utilized During Treatment: Rolling walker (2 wheels);Oxygen   OT Visit Diagnosis: Unsteadiness on feet (R26.81);Other abnormalities of gait and mobility (R26.89);Muscle weakness (generalized) (M62.81);History of falling (Z91.81)   Activity Tolerance Patient tolerated treatment well   Patient Left in bed;with call bell/phone within reach;with nursing/sitter in room   Nurse Communication Mobility status        Time: 1006-1107 OT Time Calculation (min): 61  min  Charges: OT General Charges $OT Visit: 1 Visit OT Treatments $Self Care/Home Management : 53-67 mins  Adrianne BROCKS, OT  Acute Rehabilitation Services Office [phone] Secure chat preferred   Adrianne GORMAN Savers 04/07/2024, 11:20 AM

## 2024-04-08 ENCOUNTER — Inpatient Hospital Stay (HOSPITAL_COMMUNITY)

## 2024-04-08 DIAGNOSIS — I5033 Acute on chronic diastolic (congestive) heart failure: Secondary | ICD-10-CM | POA: Diagnosis not present

## 2024-04-08 DIAGNOSIS — Z7189 Other specified counseling: Secondary | ICD-10-CM | POA: Diagnosis not present

## 2024-04-08 DIAGNOSIS — Z515 Encounter for palliative care: Secondary | ICD-10-CM | POA: Diagnosis not present

## 2024-04-08 LAB — BASIC METABOLIC PANEL WITH GFR
Anion gap: 9 (ref 5–15)
BUN: 44 mg/dL — ABNORMAL HIGH (ref 8–23)
CO2: 33 mmol/L — ABNORMAL HIGH (ref 22–32)
Calcium: 8.4 mg/dL — ABNORMAL LOW (ref 8.9–10.3)
Chloride: 93 mmol/L — ABNORMAL LOW (ref 98–111)
Creatinine, Ser: 1.51 mg/dL — ABNORMAL HIGH (ref 0.44–1.00)
GFR, Estimated: 33 mL/min — ABNORMAL LOW (ref >60)
Glucose, Bld: 99 mg/dL (ref 70–99)
Potassium: 4.2 mmol/L (ref 3.5–5.1)
Sodium: 135 mmol/L (ref 135–145)

## 2024-04-08 LAB — CBC
HCT: 24.6 % — ABNORMAL LOW (ref 36.0–46.0)
Hemoglobin: 7.3 g/dL — ABNORMAL LOW (ref 12.0–15.0)
MCH: 26.5 pg (ref 26.0–34.0)
MCHC: 29.7 g/dL — ABNORMAL LOW (ref 30.0–36.0)
MCV: 89.5 fL (ref 80.0–100.0)
Platelets: 259 K/uL (ref 150–400)
RBC: 2.75 MIL/uL — ABNORMAL LOW (ref 3.87–5.11)
RDW: 20 % — ABNORMAL HIGH (ref 11.5–15.5)
WBC: 4.7 K/uL (ref 4.0–10.5)
nRBC: 0.6 % — ABNORMAL HIGH (ref 0.0–0.2)

## 2024-04-08 LAB — PREPARE RBC (CROSSMATCH)

## 2024-04-08 LAB — MAGNESIUM: Magnesium: 2.1 mg/dL (ref 1.7–2.4)

## 2024-04-08 MED ORDER — DIPHENHYDRAMINE HCL 25 MG PO CAPS
25.0000 mg | ORAL_CAPSULE | Freq: Once | ORAL | Status: AC
  Administered 2024-04-08: 25 mg via ORAL
  Filled 2024-04-08: qty 1

## 2024-04-08 MED ORDER — TORSEMIDE 20 MG PO TABS
60.0000 mg | ORAL_TABLET | Freq: Two times a day (BID) | ORAL | Status: DC
  Administered 2024-04-08 – 2024-04-10 (×4): 60 mg via ORAL
  Filled 2024-04-08 (×4): qty 3

## 2024-04-08 MED ORDER — ACETAMINOPHEN 325 MG PO TABS
650.0000 mg | ORAL_TABLET | Freq: Once | ORAL | Status: AC
  Administered 2024-04-08: 650 mg via ORAL
  Filled 2024-04-08: qty 2

## 2024-04-08 MED ORDER — FUROSEMIDE 10 MG/ML IJ SOLN
40.0000 mg | Freq: Once | INTRAMUSCULAR | Status: AC
  Administered 2024-04-08: 40 mg via INTRAVENOUS
  Filled 2024-04-08: qty 4

## 2024-04-08 MED ORDER — FAMOTIDINE 20 MG PO TABS
20.0000 mg | ORAL_TABLET | Freq: Two times a day (BID) | ORAL | Status: AC
  Administered 2024-04-08 (×2): 20 mg via ORAL
  Filled 2024-04-08 (×2): qty 1

## 2024-04-08 MED ORDER — PREDNISONE 20 MG PO TABS
40.0000 mg | ORAL_TABLET | Freq: Once | ORAL | Status: AC
  Administered 2024-04-08: 40 mg via ORAL
  Filled 2024-04-08: qty 2

## 2024-04-08 MED ORDER — TRAMADOL HCL 50 MG PO TABS
50.0000 mg | ORAL_TABLET | Freq: Once | ORAL | Status: AC
  Administered 2024-04-08: 50 mg via ORAL
  Filled 2024-04-08: qty 1

## 2024-04-08 MED ORDER — SODIUM CHLORIDE 0.9% IV SOLUTION: Freq: Once | INTRAVENOUS | Status: DC

## 2024-04-08 NOTE — Significant Event (Signed)
 Patient was noted to get acutely short of breath towards end of PRBC transfusion.  At baseline patient is on 4 L required 15 L nonrebreather.  Blood transfusion was stopped.  Blood pressure was 111/89 pulse 58/min.  I reviewed patient's labs and notes and medications.  Patient had received 60 mg of torsemide  at around 8 PM. On exam at bedside patient denies any chest pain or flank pain no fever or chills. Chest bilateral air entry present.  Mild crepitations. Heart S1-S2 heard.  Chest x-ray ordered stat shows chronic coarsening consistent with edema and pleural effusion.  Lasix  40 mg IV was ordered following which patient's breathing improved.  Back to baseline oxygen  requirement. Suspect patient's symptoms were likely from fluid overload. Blood bank was notified.  They have requested type and screen CBC UA.  Redia Cleaver MD.

## 2024-04-08 NOTE — Progress Notes (Signed)
 [name] [phone] Liaison Note:  Notified by [employer] manager of family request for [facility]iative services at [facility] after discharge.   Please call with any hospice or outpatient palliative care related questions.   Thank you for the opportunity to participate in this patient's care.   Greig Basket, BSN, RN [facility] Liaison [phone]

## 2024-04-08 NOTE — Plan of Care (Signed)
  Problem: Skin Integrity: Goal: Skin integrity will improve Outcome: Progressing   Problem: Education: Goal: Knowledge of General Education information will improve Description: Including pain rating scale, medication(s)/side effects and non-pharmacologic comfort measures Outcome: Progressing   Problem: Coping: Goal: Level of anxiety will decrease Outcome: Progressing

## 2024-04-08 NOTE — Progress Notes (Addendum)
 PROGRESS NOTE  [name] FMW:[id number] DOB: 1933-10-03 DOA: 04/02/2024 PCP: [name], MD   LOS: 6 days   Brief narrative:  89/F with COPD on home O2, chronic diastolic CHF, RV failure, chronic leg wounds, ICD, paroxysmal A-fib not on anticoagulation presenting to the ED with weakness, shortness of breath, dyspnea on exertion, in the ER she was volume overloaded, labs noted hypokalemia, creatinine 1.5, hemoglobin 9.2, BNP 354, chest x-ray noted mild pulmonary edema and diffuse interstitial markings - Admitted started on diuretics and antibiotics for cellulitis   Assessment/Plan:  Acute on chronic diastolic CHF exacerbation.   RV Failure History of pulmonary hypertension - Followed by advanced heart failure team as outpatient, not a candidate for pulmonary vasodilators  -Repeat 2D echo this admission noted EF 55-60%, grade 3 DD, moderately reduced RV, severely elevated PASP of 91, moderate aortic stenosis - Cards following, recommended palliative care and signed off , patient declines hospice services at this time, wishes to discharge to rehab when improved - Volume status is improved, weight down 16 LB, switch to oral torsemide  today, start with 60 mg twice daily, on 100 mg twice daily at baseline - Attempt to optimize and then discharge to SNF with palliative care, high risk of quick rehospitalization  Large left lower extremity laceration sp fall chronic lower extremity wounds -sp sutures placed in the ED 12/28, wound edges not approximated, intermittent bleeding -Will request plastic surgery evaluation - Continue oral Keflex   Hypomagnesemia.  Repleted  Hypokalemia.  Improved.  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  Has had transfusions and IV iron  in the past.  Off anticoagulation at this time.  Recent anemia panel with iron  deficiency - Hemoglobin down to 7.3, given IV iron   yesterday, transfuse 1 unit PRBC today  Permanent atrial fibrillation.   Has failed amiodarone  Tikosyn  and flecainide  in the past and had multiple cardioversions.  -Not a good anticoagulation candidate with recurrent GI bleeds and small bowel AVMs.  Rate is controlled at this time.    Valvular heart disease.  History of tricuspid regurgitation and moderate mitral and aortic stenosis, pulmonary hypertension..  Not a surgical candidate.  Seen by advanced heart failure team as outpatient.    COPD with chronic hypoxic respiratory failure.  Patient is on 2 L of oxygen  at baseline at home.  Continue nebulizers, incentive spirometer.   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  Follows up with oncology Dr Lanny as outpatient. Has had transfusions in the past.    Deconditioning, debility, fall frailty, advanced age.  PT has recommended skilled nursing facility placement at this time.  Overall prognosis of patient is poor.  Goals of care discussion.  Palliative care has been consulted.  At this time patient wishes to continue current level of care refuses hospice at this time, at risk of ongoing decompensation  DVT prophylaxis: heparin  injection 5,000 Units Start: 04/02/24 2200   Disposition: Skilled nursing facility with outpatient palliative care Code Status:     Code Status: Limited: Do not attempt resuscitation (DNR) -DNR-LIMITED -Do Not Intubate/DNI  Family Communication:.  No family at bedside, will update daughter  Consultants: Cardiology Palliative care  Procedures: Left leg laceration suture in the ED    Subjective: Feels fair, breathing not at baseline  Vitals:   04/08/24 0433 04/08/24 0714  BP: (!) 95/53 (!) 116/40  Pulse: 65 63  Resp: 20 18  Temp:  97.9 F (36.6 C) 98.2 F (36.8 C)  SpO2: 97% 100%    Intake/Output Summary (Last 24 hours) at 04/08/2024 1103 Last data filed at 04/08/2024 0436 Gross per 24 hour  Intake 480 ml  Output 450 ml  Net 30 ml    Filed Weights   04/06/24 1933 04/07/24 0420 04/08/24 0500  Weight: 55.4 kg 55.5 kg 55.4 kg   Body mass index is 19.71 kg/m.   Physical Exam:  General: Chronically ill frail and debilitated female laying in bed HEENT: No JVD CVS: S1-S2, irregular rhythm Lungs: Poor air movement bilaterally, few coarse rhonchi Abdomen: Soft, nontender, bowel sounds present Remedies: Trace edema,, large laceration left lower leg with sutures, wound edges not approximated, old blood  Data Review: I have personally reviewed the following laboratory data and studies,  CBC: Recent Labs  Lab 04/02/24 1537 04/03/24 0340 04/04/24 0232 04/05/24 0320 04/06/24 0339 04/07/24 0259 04/08/24 0234  WBC 5.0   < > 5.5 5.2 5.3 4.5 4.7  NEUTROABS 3.6  --   --   --   --   --   --   HGB 8.1*  9.2*   < > 7.6* 7.4* 7.4* 7.3* 7.3*  HCT 27.2*  27.0*   < > 25.4* 24.8* 24.4* 24.3* 24.6*  MCV 90.1   < > 88.8 89.5 89.7 89.0 89.5  PLT 184   < > 214 211 190 217 259   < > = values in this interval not displayed.   Basic Metabolic Panel: Recent Labs  Lab 04/03/24 0340 04/04/24 0232 04/05/24 0320 04/06/24 0339 04/07/24 0259 04/08/24 0234  NA 140 135 137 137 137 135  K 3.7 4.9 4.6 5.0 4.8 4.2  CL 106 101 99 96* 92* 93*  CO2 24 24 29 31  33* 33*  GLUCOSE 111* 109* 121* 113* 106* 99  BUN 42* 39* 39* 42* 45* 44*  CREATININE 1.37* 1.45* 1.32* 1.42* 1.39* 1.51*  CALCIUM  8.6* 8.8* 9.0 8.9 8.8* 8.4*  MG 1.6* 1.9 1.8 2.4 2.1 2.1  PHOS 3.4  --   --  3.2 3.1  --    Liver Function Tests: Recent Labs  Lab 04/02/24 1537  AST 35  ALT 19  ALKPHOS 62  BILITOT 0.8  PROT 6.0*  ALBUMIN  3.2*   No results for input(s): LIPASE, AMYLASE in the last 168 hours. No results for input(s): AMMONIA in the last 168 hours. Cardiac Enzymes: Recent Labs  Lab 04/02/24 1537  CKTOTAL 351*   BNP (last 3 results) Recent Labs    06/24/23 1621 07/10/23 1641 04/02/24 1537  BNP 516.7* 322.2* 354.1*    ProBNP (last 3  results) No results for input(s): PROBNP in the last 8760 hours.  CBG: No results for input(s): GLUCAP in the last 168 hours. Recent Results (from the past 240 hours)  Resp panel by RT-PCR (RSV, Flu A&B, Covid) Anterior Nasal Swab     Status: None   Collection Time: 04/02/24  1:57 PM   Specimen: Anterior Nasal Swab  Result Value Ref Range Status   SARS Coronavirus 2 by RT PCR NEGATIVE NEGATIVE Final   Influenza A by PCR NEGATIVE NEGATIVE Final   Influenza B by PCR NEGATIVE NEGATIVE Final    Comment: (NOTE) The Xpert Xpress SARS-CoV-2/FLU/RSV plus assay is intended as an aid in the diagnosis of influenza from Nasopharyngeal swab specimens and should not be used as a sole basis for treatment. Nasal washings and aspirates are unacceptable for Xpert Xpress SARS-CoV-2/FLU/RSV testing.  Fact  Sheet for Patients: bloggercourse.com  Fact Sheet for Healthcare Providers: seriousbroker.it  This test is not yet approved or cleared by the [place]  FDA and has been authorized for detection and/or diagnosis of SARS-CoV-2 by FDA under an Emergency Use Authorization (EUA). This EUA will remain in effect (meaning this test can be used) for the duration of the COVID-19 declaration under Section 564(b)(1) of the Act, 21 U.S.C. section 360bbb-3(b)(1), unless the authorization is terminated or revoked.     Resp Syncytial Virus by PCR NEGATIVE NEGATIVE Final    Comment: (NOTE) Fact Sheet for Patients: bloggercourse.com  Fact Sheet for Healthcare Providers: seriousbroker.it  This test is not yet approved or cleared by the [place]  FDA and has been authorized for detection and/or diagnosis of SARS-CoV-2 by FDA under an Emergency Use Authorization (EUA). This EUA will remain in effect (meaning this test can be used) for the duration of the COVID-19 declaration under Section 564(b)(1) of  the Act, 21 U.S.C. section 360bbb-3(b)(1), unless the authorization is terminated or revoked.  Performed at [facility], [address] [place], [place] [address]   Blood culture (routine x 2)     Status: None   Collection Time: 04/02/24  1:58 PM   Specimen: BLOOD  Result Value Ref Range Status   Specimen Description BLOOD RIGHT ANTECUBITAL  Final   Special Requests   Final    BOTTLES DRAWN AEROBIC AND ANAEROBIC Blood Culture results may not be optimal due to an inadequate volume of blood received in culture bottles   Culture   Final    NO GROWTH 5 DAYS Performed at [facility], [address] [place], [place] [address]    Report Status 04/07/2024 FINAL  Final  Blood culture (routine x 2)     Status: None   Collection Time: 04/02/24  2:03 PM   Specimen: BLOOD  Result Value Ref Range Status   Specimen Description BLOOD RIGHT ANTECUBITAL  Final   Special Requests   Final    BOTTLES DRAWN AEROBIC AND ANAEROBIC Blood Culture results may not be optimal due to an inadequate volume of blood received in culture bottles   Culture   Final    NO GROWTH 5 DAYS Performed at [facility], [address], [place], [place] [address]    Report Status 04/07/2024 FINAL  Final     Studies: No results found.     Sigurd Pac, MD  [facility] 04/08/2024  If 7PM-7AM, please contact night-coverage

## 2024-04-08 NOTE — TOC Progression Note (Addendum)
 Transition of Care [facility]) - Progression Note    Patient Details  Name: [name] MRN: [id number] Date of Birth: [birth year]  Transition of Care [facility]) CM/SW Contact  [name], [place] Phone Number: 04/08/2024, 12:04 PM  Clinical Narrative:   CSW spoke with [facility] about consult for outpatient Palliative following patient at [facility]. [name] is agreeable and CSW informed her that [facility] contracts with [facility]. CSW initiated referral to [facility] at this time.   1:23 PM [facility] called CSW to offer a peer to peer for SNF due by 5 PM today. MD to call Dr. Elspeth [name] at [phone]. MD notified of peer to peer request.  ROME barrows request approved - id [id number].   4:30 PM Peer to peer complete and per MD, insurance denied. CSW spoke with patients daughter, [name], about insurance denial. Per [name], she believes patient is frail and in need of SNF. CSW reviewed PT note with [name] that stated patient is improving mobility wise. [name] stated family is in the process of applying for ltc medicaid and are currently working on the application. [name] reports patient receives about $2,189 in SSI and $822.21 from her pension after taxes. CSW inquired if patient was to go to SNF what would be the plan after. [name] stated while waiting for LTC medicaid approval, family could take patient in.   CSW called Dr. Sar to explain patients current situation. Per Dr. Sar, patient is at her baseline and would be more appropriate for ALF. Patients current condition is more in lines with SDOH needs rather than medical/skillable needs. Dr. Sar informed CSW of one time custodial care benefit with [facility] that requires prior authorization. The benefit includes 20 hours of aid services and is a transitional program that can be utilized while family is making other arrangements. CSW to follow up with family and treatment team.   CSW will continue to follow.    Expected Discharge Plan: Skilled Nursing Facility Barriers to  Discharge: Continued Medical Work up, English As A Second Language Teacher              Expected Discharge Plan and Services In-house Referral: Clinical Social Work   Post Acute Care Choice: Skilled Nursing Facility Living arrangements for the past 2 months: Single Family Home                                       Social Drivers of Health (SDOH) Interventions SDOH Screenings   Food Insecurity: No Food Insecurity (04/02/2024)  Housing: Low Risk  (04/02/2024)  Transportation Needs: No Transportation Needs (04/02/2024)  Utilities: Not At Risk (04/02/2024)  Alcohol Screen: Low Risk  (11/06/2023)  Depression (PHQ2-9): Low Risk  (03/01/2024)  Financial Resource Strain: Low Risk  (11/06/2023)  Physical Activity: Insufficiently Active (11/06/2023)  Social Connections: Socially Isolated (04/02/2024)  Stress: No Stress Concern Present (11/06/2023)  Tobacco Use: Medium Risk (04/02/2024)  Health Literacy: Adequate Health Literacy (11/06/2023)    Readmission Risk Interventions    06/17/2023   10:07 AM 01/31/2023    3:53 PM 11/09/2022   12:00 PM  Readmission Risk Prevention Plan  Transportation Screening Complete Complete Complete  PCP or Specialist Appt within 3-5 Days   Complete  HRI or Home Care Consult  Complete Complete  Social Work Consult for Recovery Care Planning/Counseling   Complete  Palliative Care Screening  Not Applicable   Medication Review Oceanographer)  Complete Complete  HRI  or Home Care Consult Complete    SW Recovery Care/Counseling Consult Complete    Palliative Care Screening Not Applicable    Skilled Nursing Facility Not Applicable

## 2024-04-08 NOTE — Progress Notes (Signed)
 Physical Therapy Treatment Patient Details Name: [name] MRN: [id number] DOB: 1933/10/20 Today's Date: 04/08/2024   History of Present Illness Pt is a 89 y.o female admitted for fall and SOB 11/28. Admitted for LLE cellulitis with laceration and CHF exacerbation. PMH: PVD, pancreatic CA on chemotherapy, HRpEF, CKD-III, hx of nephrectomy, squamous cell carcinoma, HLD, pacemaker, COPD 2L    PT Comments  Pt tolerated session well, demonstrating improvements in strength and mobility. Upon arrival, pt reporting she recently ambulated to bathroom and preferred to do bed level exercises. Pt then participated in LE strengthening exercises (ankle pumps, glute bridges, hip ABD, hip flexion, and SLR), requiring several rest breaks in between due to feeling SOB; SpO2 reading 96%. During session, pt reporting significant pain in L bicep; RN notified. Pt would benefit from gait training, and further LE strengthening. PT will continue to treat pt while she is admitted. Patient will benefit from continued inpatient follow up therapy, <3 hours/day.    If plan is discharge home, recommend the following: A little help with walking and/or transfers;A little help with bathing/dressing/bathroom;Assistance with cooking/housework;Assist for transportation;Help with stairs or ramp for entrance   Can travel by private vehicle     Yes  Equipment Recommendations  None recommended by PT    Recommendations for Other Services       Precautions / Restrictions Precautions Precautions: Fall Recall of Precautions/Restrictions: Intact Restrictions Weight Bearing Restrictions Per Provider Order: No     Mobility  Bed Mobility                    Transfers                        Ambulation/Gait                   Stairs             Wheelchair Mobility     Tilt Bed    Modified Rankin (Stroke Patients Only)       Balance                                             Communication Communication Communication: No apparent difficulties  Cognition Arousal: Alert Behavior During Therapy: WFL for tasks assessed/performed   PT - Cognitive impairments: No apparent impairments                         Following commands: Intact      Cueing Cueing Techniques: Verbal cues, Gestural cues  Exercises General Exercises - Lower Extremity Ankle Circles/Pumps: AROM, Both, 20 reps Hip ABduction/ADduction: AROM, Both, 10 reps (hooklying) Straight Leg Raises: AROM, Both, 10 reps Hip Flexion/Marching: AROM, Both, 10 reps, Supine Low Level/ICU Exercises Stabilized Bridging: Strengthening, 10 reps (hooklying)    General Comments        Pertinent Vitals/Pain Pain Assessment Pain Assessment: 0-10 Pain Score: 7  Faces Pain Scale: Hurts even more Pain Location: LUE Pain Descriptors / Indicators: Discomfort, Dull, Heaviness, Guarding, Sore, Tender Pain Intervention(s): Limited activity within patient's tolerance, Monitored during session, Other (comment) (RN notified)    Home Living                          Prior Function  PT Goals (current goals can now be found in the care plan section) Acute Rehab PT Goals Patient Stated Goal: Get well, reduce pain PT Goal Formulation: With patient Time For Goal Achievement: 04/17/24 Potential to Achieve Goals: Good Progress towards PT goals: Progressing toward goals    Frequency    Min 2X/week      PT Plan      Co-evaluation              AM-PAC PT 6 Clicks Mobility   Outcome Measure  Help needed turning from your back to your side while in a flat bed without using bedrails?: A Little Help needed moving from lying on your back to sitting on the side of a flat bed without using bedrails?: A Little Help needed moving to and from a bed to a chair (including a wheelchair)?: A Little Help needed standing up from a chair using your arms (e.g., wheelchair  or bedside chair)?: A Little Help needed to walk in hospital room?: A Little Help needed climbing 3-5 steps with a railing? : A Lot 6 Click Score: 17    End of Session Equipment Utilized During Treatment: Oxygen  Activity Tolerance: Patient tolerated treatment well Patient left: in bed;with call bell/phone within reach;with nursing/sitter in room Nurse Communication: Mobility status;Other (comment) (pt reporting pain in L bicep near IV site; RN notified) PT Visit Diagnosis: Unsteadiness on feet (R26.81);Other abnormalities of gait and mobility (R26.89);Muscle weakness (generalized) (M62.81);History of falling (Z91.81);Difficulty in walking, not elsewhere classified (R26.2);Pain Pain - Right/Left: Left Pain - part of body: Arm     Time: 1351-1419 PT Time Calculation (min) (ACUTE ONLY): 28 min  Charges:    $Therapeutic Exercise: 23-37 mins PT General Charges $$ ACUTE PT VISIT: 1 Visit                     Leontine Hilt DPT Acute Rehab Services [phone] Prefer contact via chat   Leontine NOVAK Clarann Helvey 04/08/2024, 3:29 PM

## 2024-04-08 NOTE — Plan of Care (Signed)
   Problem: Education: Goal: Knowledge of General Education information will improve Description: Including pain rating scale, medication(s)/side effects and non-pharmacologic comfort measures Outcome: Progressing   Problem: Activity: Goal: Risk for activity intolerance will decrease Outcome: Progressing   Problem: Coping: Goal: Level of anxiety will decrease Outcome: Progressing

## 2024-04-08 NOTE — Progress Notes (Signed)
 IV team to bedside to evaluate for new IV access. Waited at bedside during patient toileting and MD evaluation, unable to insert IV at this time.

## 2024-04-08 NOTE — Progress Notes (Signed)
 PT Cancellation Note  Patient Details Name: [name] MRN: [id number] DOB: [birth year]   Cancelled Treatment:    Reason Eval/Treat Not Completed: Other (comment) (RN performing dressing changes. Plan to follow-up later.)  Leontine Hilt DPT Acute Rehab Services [phone] Prefer contact via chat   Leontine KATHEE Hilt 04/08/2024, 8:57 AM

## 2024-04-08 NOTE — Progress Notes (Signed)
 [name]   DOB:[birth year]   FM#:[id number]   RDW#:[id number]  Hematology and oncology follow-up note  Subjective: Patient is well-known to me, under my care for her anemia due to GI bleeding, and neuroendocrine tumor.  She was admitted for CHF exacerbation.    Objective:  Vitals:   04/08/24 2022 04/08/24 2040  BP: (!) 127/50 121/78  Pulse: (!) 58 (!) 59  Resp: 17 16  Temp: (!) 97.4 F (36.3 C) (!) 97.5 F (36.4 C)  SpO2: 90% 99%    Body mass index is 19.71 kg/m.  Intake/Output Summary (Last 24 hours) at 04/08/2024 2314 Last data filed at 04/08/2024 2100 Gross per 24 hour  Intake 480 ml  Output 450 ml  Net 30 ml     Sclerae unicteric  Oropharynx clear  No peripheral adenopathy  Lungs clear -- no rales or rhonchi  Heart regular rate and rhythm  Abdomen benign  MSK no focal spinal tenderness, (+) left lower extremity wound and wrapped with gauze  Neuro nonfocal    CBG (last 3)  No results for input(s): GLUCAP in the last 72 hours.   Labs:  Lab Results  Component Value Date   WBC 4.7 04/08/2024   HGB 7.3 (L) 04/08/2024   HCT 24.6 (L) 04/08/2024   MCV 89.5 04/08/2024   PLT 259 04/08/2024   NEUTROABS 3.6 04/02/2024     Urine Studies No results for input(s): UHGB, CRYS in the last 72 hours.  Invalid input(s): UACOL, UAPR, USPG, UPH, UTP, UGL, UKET, UBIL, UNIT, UROB, [place], UEPI, UWBC, CORINN NUMBERS [place], [place], [place]  Basic Metabolic Panel: Recent Labs  Lab 04/03/24 0340 04/04/24 0232 04/05/24 0320 04/06/24 0339 04/07/24 0259 04/08/24 0234  NA 140 135 137 137 137 135  K 3.7 4.9 4.6 5.0 4.8 4.2  CL 106 101 99 96* 92* 93*  CO2 24 24 29 31  33* 33*  GLUCOSE 111* 109* 121* 113* 106* 99  BUN 42* 39* 39* 42* 45* 44*  CREATININE 1.37* 1.45* 1.32* 1.42* 1.39* 1.51*  CALCIUM  8.6* 8.8* 9.0 8.9 8.8* 8.4*  MG 1.6* 1.9 1.8 2.4 2.1 2.1  PHOS 3.4  --   --  3.2 3.1  --    GFR Estimated Creatinine Clearance: 22.5 mL/min  (A) (by C-G formula based on SCr of 1.51 mg/dL (H)). Liver Function Tests: Recent Labs  Lab 04/02/24 1537  AST 35  ALT 19  ALKPHOS 62  BILITOT 0.8  PROT 6.0*  ALBUMIN  3.2*   No results for input(s): LIPASE, AMYLASE in the last 168 hours. No results for input(s): AMMONIA in the last 168 hours. Coagulation profile No results for input(s): INR, PROTIME in the last 168 hours.  CBC: Recent Labs  Lab 04/02/24 1537 04/03/24 0340 04/04/24 0232 04/05/24 0320 04/06/24 0339 04/07/24 0259 04/08/24 0234  WBC 5.0   < > 5.5 5.2 5.3 4.5 4.7  NEUTROABS 3.6  --   --   --   --   --   --   HGB 8.1*  9.2*   < > 7.6* 7.4* 7.4* 7.3* 7.3*  HCT 27.2*  27.0*   < > 25.4* 24.8* 24.4* 24.3* 24.6*  MCV 90.1   < > 88.8 89.5 89.7 89.0 89.5  PLT 184   < > 214 211 190 217 259   < > = values in this interval not displayed.   Cardiac Enzymes: Recent Labs  Lab 04/02/24 1537  CKTOTAL 351*   BNP: Invalid input(s): POCBNP CBG: No results  for input(s): GLUCAP in the last 168 hours. D-Dimer No results for input(s): DDIMER in the last 72 hours. Hgb A1c No results for input(s): HGBA1C in the last 72 hours. Lipid Profile No results for input(s): CHOL, HDL, LDLCALC, TRIG, CHOLHDL, LDLDIRECT in the last 72 hours. Thyroid  function studies No results for input(s): TSH, T4TOTAL, T3FREE, THYROIDAB in the last 72 hours.  Invalid input(s): FREET3 Anemia work up No results for input(s): VITAMINB12, FOLATE, FERRITIN, TIBC, IRON , RETICCTPCT in the last 72 hours. Microbiology Recent Results (from the past 240 hours)  Resp panel by RT-PCR (RSV, Flu A&B, Covid) Anterior Nasal Swab     Status: None   Collection Time: 04/02/24  1:57 PM   Specimen: Anterior Nasal Swab  Result Value Ref Range Status   SARS Coronavirus 2 by RT PCR NEGATIVE NEGATIVE Final   Influenza A by PCR NEGATIVE NEGATIVE Final   Influenza B by PCR NEGATIVE NEGATIVE Final    Comment:  (NOTE) The Xpert Xpress SARS-CoV-2/FLU/RSV plus assay is intended as an aid in the diagnosis of influenza from Nasopharyngeal swab specimens and should not be used as a sole basis for treatment. Nasal washings and aspirates are unacceptable for Xpert Xpress SARS-CoV-2/FLU/RSV testing.  Fact Sheet for Patients: bloggercourse.com  Fact Sheet for Healthcare Providers: seriousbroker.it  This test is not yet approved or cleared by the [place]  FDA and has been authorized for detection and/or diagnosis of SARS-CoV-2 by FDA under an Emergency Use Authorization (EUA). This EUA will remain in effect (meaning this test can be used) for the duration of the COVID-19 declaration under Section 564(b)(1) of the Act, 21 U.S.C. section 360bbb-3(b)(1), unless the authorization is terminated or revoked.     Resp Syncytial Virus by PCR NEGATIVE NEGATIVE Final    Comment: (NOTE) Fact Sheet for Patients: bloggercourse.com  Fact Sheet for Healthcare Providers: seriousbroker.it  This test is not yet approved or cleared by the [place]  FDA and has been authorized for detection and/or diagnosis of SARS-CoV-2 by FDA under an Emergency Use Authorization (EUA). This EUA will remain in effect (meaning this test can be used) for the duration of the COVID-19 declaration under Section 564(b)(1) of the Act, 21 U.S.C. section 360bbb-3(b)(1), unless the authorization is terminated or revoked.  Performed at [facility], [address] [place], [place] [address]   Blood culture (routine x 2)     Status: None   Collection Time: 04/02/24  1:58 PM   Specimen: BLOOD  Result Value Ref Range Status   Specimen Description BLOOD RIGHT ANTECUBITAL  Final   Special Requests   Final    BOTTLES DRAWN AEROBIC AND ANAEROBIC Blood Culture results may not be optimal due to an inadequate volume of blood  received in culture bottles   Culture   Final    NO GROWTH 5 DAYS Performed at [facility], [address] [place], [place] [address]    Report Status 04/07/2024 FINAL  Final  Blood culture (routine x 2)     Status: None   Collection Time: 04/02/24  2:03 PM   Specimen: BLOOD  Result Value Ref Range Status   Specimen Description BLOOD RIGHT ANTECUBITAL  Final   Special Requests   Final    BOTTLES DRAWN AEROBIC AND ANAEROBIC Blood Culture results may not be optimal due to an inadequate volume of blood received in culture bottles   Culture   Final    NO GROWTH 5 DAYS Performed at [facility]  Lab, [address] [place], [place] [address]    Report Status 04/07/2024 FINAL  Final      Studies:  No results found.  Assessment: 89 y.o. 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 Her overall health has further declined, and she will likely need total care. - Continue Retacrit , I ordered a dose of 10K 2 days ago. Please continue weekly when she is in hospital -give blood transfusion if hemoglobin less than 7.5 - Consider IV iron  if ferritin less than 50 - Will hold her Sandostatin  injection and bevacizumab  while she is in hospital  -I will see her back in office if she wishes, but I think she is a proper candidate for hospice care.   Onita Mattock, MD 04/08/2024

## 2024-04-09 DIAGNOSIS — I5033 Acute on chronic diastolic (congestive) heart failure: Secondary | ICD-10-CM | POA: Diagnosis not present

## 2024-04-09 DIAGNOSIS — R0602 Shortness of breath: Secondary | ICD-10-CM | POA: Diagnosis not present

## 2024-04-09 DIAGNOSIS — R918 Other nonspecific abnormal finding of lung field: Secondary | ICD-10-CM | POA: Diagnosis not present

## 2024-04-09 DIAGNOSIS — J90 Pleural effusion, not elsewhere classified: Secondary | ICD-10-CM | POA: Diagnosis not present

## 2024-04-09 DIAGNOSIS — Z95810 Presence of automatic (implantable) cardiac defibrillator: Secondary | ICD-10-CM | POA: Diagnosis not present

## 2024-04-09 LAB — BASIC METABOLIC PANEL WITH GFR
Anion gap: 14 (ref 5–15)
BUN: 42 mg/dL — ABNORMAL HIGH (ref 8–23)
CO2: 31 mmol/L (ref 22–32)
Calcium: 8.6 mg/dL — ABNORMAL LOW (ref 8.9–10.3)
Chloride: 87 mmol/L — ABNORMAL LOW (ref 98–111)
Creatinine, Ser: 1.48 mg/dL — ABNORMAL HIGH (ref 0.44–1.00)
GFR, Estimated: 34 mL/min — ABNORMAL LOW (ref >60)
Glucose, Bld: 132 mg/dL — ABNORMAL HIGH (ref 70–99)
Potassium: 5 mmol/L (ref 3.5–5.1)
Sodium: 132 mmol/L — ABNORMAL LOW (ref 135–145)

## 2024-04-09 LAB — CBC WITH DIFFERENTIAL/PLATELET
Abs Immature Granulocytes: 0.06 K/uL (ref 0.00–0.07)
Basophils Absolute: 0 K/uL (ref 0.0–0.1)
Basophils Relative: 0 %
Eosinophils Absolute: 0 K/uL (ref 0.0–0.5)
Eosinophils Relative: 0 %
HCT: 28.7 % — ABNORMAL LOW (ref 36.0–46.0)
Hemoglobin: 8.9 g/dL — ABNORMAL LOW (ref 12.0–15.0)
Immature Granulocytes: 1 %
Lymphocytes Relative: 2 %
Lymphs Abs: 0.3 K/uL — ABNORMAL LOW (ref 0.7–4.0)
MCH: 27.5 pg (ref 26.0–34.0)
MCHC: 31 g/dL (ref 30.0–36.0)
MCV: 88.6 fL (ref 80.0–100.0)
Monocytes Absolute: 0.8 K/uL (ref 0.1–1.0)
Monocytes Relative: 8 %
Neutro Abs: 9.8 K/uL — ABNORMAL HIGH (ref 1.7–7.7)
Neutrophils Relative %: 89 %
Platelets: 278 K/uL (ref 150–400)
RBC: 3.24 MIL/uL — ABNORMAL LOW (ref 3.87–5.11)
RDW: 19.7 % — ABNORMAL HIGH (ref 11.5–15.5)
WBC: 11 K/uL — ABNORMAL HIGH (ref 4.0–10.5)
nRBC: 0 % (ref 0.0–0.2)

## 2024-04-09 NOTE — Progress Notes (Signed)
 PROGRESS NOTE  [name] FMW:[id number] DOB: [birth year] DOA: 04/02/2024 PCP: [name], MD   LOS: 7 days   Brief narrative:  89/F with COPD on home O2, chronic diastolic CHF, RV failure, chronic leg wounds, ICD, paroxysmal A-fib not on anticoagulation, pancreatic CA Hello presenting to the ED with weakness, shortness of breath, dyspnea on exertion, in the ER she was volume overloaded, labs noted hypokalemia, creatinine 1.5, hemoglobin 9.2, BNP 354, chest x-ray noted mild pulmonary edema and diffuse interstitial markings - Admitted started on diuretics and antibiotics for cellulitis   Assessment/Plan:  Acute on chronic diastolic CHF exacerbation.   RV Failure History of pulmonary hypertension - Followed by advanced heart failure team as outpatient, not a candidate for pulmonary vasodilators  -Repeat 2D echo this admission noted EF 55-60%, grade 3 DD, moderately reduced RV, severely elevated PASP of 91, moderate aortic stenosis - Cards following, recommended palliative care and signed off , patient declines hospice services at this time, wishes to discharge to rehab when improved - Volume status is improved, weight down 16 LB, switch to oral torsemide  60 mg twice daily, on 100 mg twice daily at baseline - Plan was to optimize and then discharge to SNF with palliative care, high risk of quick rehospitalization, declined by insurance for rehab, discussed with patient and daughter regarding hospice again - Disposition to be determined  Large left lower extremity laceration sp fall chronic lower extremity wounds -sp sutures placed in the ED 12/28, wound edges not approximated, intermittent bleeding - Requested plastic surgery evaluation - Continue oral Keflex   Hypomagnesemia.  Repleted  Hypokalemia.  Improved.  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  Has had transfusions and IV iron  in the past.   Off anticoagulation at this time.  Recent anemia panel with iron  deficiency - Hemoglobin down to 7.3, given IV iron  and transfused PRBC yesterday, hemoglobin improving  Permanent atrial fibrillation.   Has failed amiodarone  Tikosyn  and flecainide  in the past and had multiple cardioversions.  -Not a good anticoagulation candidate with recurrent GI bleeds and small bowel AVMs.  Rate is controlled at this time.    Valvular heart disease.  History of tricuspid regurgitation and moderate mitral and aortic stenosis, pulmonary hypertension..  Not a surgical candidate.  Seen by advanced heart failure team as outpatient.    COPD with chronic hypoxic respiratory failure.  Patient is on 2 L of oxygen  at baseline at home.  Continue nebulizers, incentive spirometer.   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  Follows up with oncology Dr Lanny as outpatient. Has had transfusions in the past.    Deconditioning, debility, fall frailty, advanced age.  PT has recommended skilled nursing facility placement at this time.  Overall prognosis of patient is poor.  Goals of care discussion.  Palliative care has been consulted.  At this time patient wishes to continue current level of care refuses hospice at this time, at risk of ongoing decompensation  DVT prophylaxis: heparin  injection 5,000 Units Start: 04/02/24 2200   Disposition: Skilled nursing facility with outpatient palliative care Code Status:     Code Status: Limited: Do not attempt resuscitation (DNR) -DNR-LIMITED -Do Not Intubate/DNI  Family Communication:.  No family at bedside, updated daughter  Consultants: Cardiology Palliative care  Procedures: Left leg laceration suture in the ED    Subjective: Feels fair, had some dyspnea towards the end of blood transfusion, now  improved  Vitals:   04/09/24 0725 04/09/24 1100  BP: (!) 140/63 (!) 149/89  Pulse: (!) 59 62  Resp: (!) 25 18  Temp: 97.7 F (36.5 C)   SpO2: 100% 100%     Intake/Output Summary (Last 24 hours) at 04/09/2024 1144 Last data filed at 04/08/2024 2309 Gross per 24 hour  Intake 688 ml  Output --  Net 688 ml   Filed Weights   04/07/24 0420 04/08/24 0500 04/09/24 0528  Weight: 55.5 kg 55.4 kg 56 kg   Body mass index is 19.93 kg/m.   Physical Exam:  General: Chronically ill frail and debilitated female laying in bed HEENT: No JVD CVS: S1-S2, irregular rhythm Lungs: Poor air movement bilaterally, few coarse rhonchi Abdomen: Soft, nontender, bowel sounds present Remedies: Trace edema,, large laceration left lower leg with sutures, wound edges not approximated, old blood  Data Review: I have personally reviewed the following laboratory data and studies,  CBC: Recent Labs  Lab 04/02/24 1537 04/03/24 0340 04/05/24 0320 04/06/24 0339 04/07/24 0259 04/08/24 0234 04/09/24 0243  WBC 5.0   < > 5.2 5.3 4.5 4.7 11.0*  NEUTROABS 3.6  --   --   --   --   --  9.8*  HGB 8.1*  9.2*   < > 7.4* 7.4* 7.3* 7.3* 8.9*  HCT 27.2*  27.0*   < > 24.8* 24.4* 24.3* 24.6* 28.7*  MCV 90.1   < > 89.5 89.7 89.0 89.5 88.6  PLT 184   < > 211 190 217 259 278   < > = values in this interval not displayed.   Basic Metabolic Panel: Recent Labs  Lab 04/03/24 0340 04/04/24 0232 04/05/24 0320 04/06/24 0339 04/07/24 0259 04/08/24 0234 04/09/24 0243  NA 140 135 137 137 137 135 132*  K 3.7 4.9 4.6 5.0 4.8 4.2 5.0  CL 106 101 99 96* 92* 93* 87*  CO2 24 24 29 31  33* 33* 31  GLUCOSE 111* 109* 121* 113* 106* 99 132*  BUN 42* 39* 39* 42* 45* 44* 42*  CREATININE 1.37* 1.45* 1.32* 1.42* 1.39* 1.51* 1.48*  CALCIUM  8.6* 8.8* 9.0 8.9 8.8* 8.4* 8.6*  MG 1.6* 1.9 1.8 2.4 2.1 2.1  --   PHOS 3.4  --   --  3.2 3.1  --   --    Liver Function Tests: Recent Labs  Lab 04/02/24 1537  AST 35  ALT 19  ALKPHOS 62  BILITOT 0.8  PROT 6.0*  ALBUMIN  3.2*   No results for input(s): LIPASE, AMYLASE in the last 168 hours. No results for input(s): AMMONIA in the  last 168 hours. Cardiac Enzymes: Recent Labs  Lab 04/02/24 1537  CKTOTAL 351*   BNP (last 3 results) Recent Labs    06/24/23 1621 07/10/23 1641 04/02/24 1537  BNP 516.7* 322.2* 354.1*    ProBNP (last 3 results) No results for input(s): PROBNP in the last 8760 hours.  CBG: No results for input(s): GLUCAP in the last 168 hours. Recent Results (from the past 240 hours)  Resp panel by RT-PCR (RSV, Flu A&B, Covid) Anterior Nasal Swab     Status: None   Collection Time: 04/02/24  1:57 PM   Specimen: Anterior Nasal Swab  Result Value Ref Range Status   SARS Coronavirus 2 by RT PCR NEGATIVE NEGATIVE Final   Influenza A by PCR NEGATIVE NEGATIVE Final   Influenza B by PCR NEGATIVE NEGATIVE Final    Comment: (NOTE) The Xpert Xpress SARS-CoV-2/FLU/RSV plus assay is  intended as an aid in the diagnosis of influenza from Nasopharyngeal swab specimens and should not be used as a sole basis for treatment. Nasal washings and aspirates are unacceptable for Xpert Xpress SARS-CoV-2/FLU/RSV testing.  Fact Sheet for Patients: bloggercourse.com  Fact Sheet for Healthcare Providers: seriousbroker.it  This test is not yet approved or cleared by the [place]  FDA and has been authorized for detection and/or diagnosis of SARS-CoV-2 by FDA under an Emergency Use Authorization (EUA). This EUA will remain in effect (meaning this test can be used) for the duration of the COVID-19 declaration under Section 564(b)(1) of the Act, 21 U.S.C. section 360bbb-3(b)(1), unless the authorization is terminated or revoked.     Resp Syncytial Virus by PCR NEGATIVE NEGATIVE Final    Comment: (NOTE) Fact Sheet for Patients: bloggercourse.com  Fact Sheet for Healthcare Providers: seriousbroker.it  This test is not yet approved or cleared by the [place]  FDA and has been authorized for detection  and/or diagnosis of SARS-CoV-2 by FDA under an Emergency Use Authorization (EUA). This EUA will remain in effect (meaning this test can be used) for the duration of the COVID-19 declaration under Section 564(b)(1) of the Act, 21 U.S.C. section 360bbb-3(b)(1), unless the authorization is terminated or revoked.  Performed at [facility], [address], [place], [place] [address]   Blood culture (routine x 2)     Status: None   Collection Time: 04/02/24  1:58 PM   Specimen: BLOOD  Result Value Ref Range Status   Specimen Description BLOOD RIGHT ANTECUBITAL  Final   Special Requests   Final    BOTTLES DRAWN AEROBIC AND ANAEROBIC Blood Culture results may not be optimal due to an inadequate volume of blood received in culture bottles   Culture   Final    NO GROWTH 5 DAYS Performed at [facility], [address] [place], [place] [address]    Report Status 04/07/2024 FINAL  Final  Blood culture (routine x 2)     Status: None   Collection Time: 04/02/24  2:03 PM   Specimen: BLOOD  Result Value Ref Range Status   Specimen Description BLOOD RIGHT ANTECUBITAL  Final   Special Requests   Final    BOTTLES DRAWN AEROBIC AND ANAEROBIC Blood Culture results may not be optimal due to an inadequate volume of blood received in culture bottles   Culture   Final    NO GROWTH 5 DAYS Performed at [facility], [address], [place], [place] [address]    Report Status 04/07/2024 FINAL  Final     Studies: DG Chest Port 1 View Result Date: 04/09/2024 EXAM: 1 VIEW(S) XRAY OF THE CHEST 04/09/2024 12:07:55 AM COMPARISON: 04/02/2024 CLINICAL HISTORY: SOB (shortness of breath) FINDINGS: LUNGS AND PLEURA: Small bilateral pleural effusions. Chronic coarsened interstitial markings. Left basilar opacity. No pneumothorax. HEART AND MEDIASTINUM: Cardiomegaly. Atherosclerotic calcifications. Left chest AICD/pacemaker in place. BONES AND SOFT TISSUES: Similar sclerotic lesion of the left  proximal humerus. IMPRESSION: 1. Chronic coarsened interstitial markings consistent with edema. Small bilateral pleural effusions. 2. Left basilar opacity, which may reflect atelectasis or superimposed infection. Electronically signed by: Norman Gatlin MD 04/09/2024 12:14 AM EST RP Workstation: [id number]       Sigurd Pac, MD  [facility] 04/09/2024  If 7PM-7AM, please contact night-coverage

## 2024-04-09 NOTE — Progress Notes (Signed)
 Mobility Specialist Progress Note:    04/09/24 1421  Mobility  Activity Dangled on edge of bed (Ankle Pumps, Leg Ext, Seated Marches)  Level of Assistance Standby assist, set-up cues, supervision of patient - no hands on  Assistive Device None  Range of Motion/Exercises Active Assistive  Activity Response Tolerated fair  Mobility Referral Yes  Mobility visit 1 Mobility  Mobility Specialist Start Time (ACUTE ONLY) 1421  Mobility Specialist Stop Time (ACUTE ONLY) 1430  Mobility Specialist Time Calculation (min) (ACUTE ONLY) 9 min   Received pt sitting EOB agreeable to session but seemed somewhat confused. C/o leg pain and slight SOB at moments. Pt needing assist, to move and perform some movements. Returned pt to bed w/ all needs met.   Venetia Keel Mobility Specialist Please Neurosurgeon or Rehab Office at [phone]

## 2024-04-09 NOTE — Consult Note (Signed)
 Reason for Consult: LLE wound Referring Physician: Dr. Fairy Coy [name] is [name] 89 y.o. female.  HPI: Patient with extensive comorbid disease who presented to the ED after sustaining ground level fall at home and was subsequently admitted for acute on chronic CHF exacerbation and treatment for LLE cellulitis.  Our service was consulted for evaluation of the laceration.   Reviewed ER note and the left shin laceration was repaired using 4-0 plain gut figure of 8 sutures for hemostasis given bleeding despite placement of combat gauze. She has required PRBC transfusion for anemia with hemoglobin improving from 7.3 yesterday to 8.9 today.  On exam, patient resting comfortably in bed.  She was a bit concerned about her possible disposition to a rehab facility and went on to tell me that she would not be here for long.  As for the LLE, she stated that she had some discomfort and tenderness, particularly along her knee.  RN was present and assisted with dressing change.   Past Medical History:  Diagnosis Date   (HFpEF) heart failure with preserved ejection fraction (HCC)    EF=60-60%   Actinic keratosis 01/17/2015   R forearm   Adjustment disorder with anxiety    Adverse effect of other narcotics, sequela    Intolerance to all narcotics   Anti-Duffy antibodies present    Aortic valve stenosis    Arthritis    some in my hands (11/11/2012)   Atrial fibrillation, permanent (HCC)    Eliquis    Atypical mole 03/25/2018   L forearm - severe   Automatic implantable cardioverter-defibrillator in situ    Avascular necrosis of hip (HCC) 05/03/2011   Carotid artery stenosis 09/2007   a. 09/2007: 60-79% bilateral (stable); b. 10/2008: 40-59% R 60-79%    Cellulitis of left lower extremity 07/13/2020   CKD (chronic kidney disease), stage III (HCC)    COPD (chronic obstructive pulmonary disease) (HCC)    Coronary artery disease    non-obstructive by 2006 cath   COVID-19 virus infection 02/12/2022    Displaced fracture of left femoral neck (HCC) 10/09/2018   GI bleed 03/28/2020   AVM   Gram-negative bacteremia 09/05/2023   High cholesterol    Hypertension 05/20/2011   Hypertr obst cardiomyop    Hypotension, unspecified    cardiac cath 2006..nonobstructive CAD 30-40s lesions.SABRAETT 1/09 nondiagnostic due to poor HR response..Right Renal Cancer 2003   Iron  deficiency anemia    Long term (current) use of anticoagulants    On home oxygen  therapy    3L [place]   Osteoarthritis of right hip    PAD (peripheral artery disease)    Pancreatic cancer (HCC)    sandostatin    PONV (postoperative nausea and vomiting)    Presence of permanent cardiac pacemaker    Pulmonary HTN (HCC)    RECTAL BLEEDING 10/13/2009   Qualifier: Diagnosis of  By: Kerman NP, Paula     Red blood cell antibody positive with compatible PRBC difficult to obtain    Anti FYA (Duffy a) antibody. Must be transfused with PRB which are Duffy Antigen Negative and Crossmatch Compatible   Renal cell carcinoma (HCC) 2005   s/p right nephrectomy   Squamous cell carcinoma of skin 01/17/2015   R lat wrist   Squamous cell carcinoma of skin 01/29/2018   R post upper leg - superficially invasive   Squamous cell carcinoma of skin 03/25/2018   L lat foot   Squamous cell carcinoma of skin 11/09/2020   left lat foot -  EDC 01/01/21, recurrent 01/31/21 - MOHs 02/22/21   Squamous cell carcinoma of skin 12/19/2021   Right Posterior Medial Thigh, EDC   Squamous cell carcinoma of skin 12/19/2021   SCC IS, L lat heel, EDC 02/04/2022   Squamous cell carcinoma of skin 01/21/2022   L forearm, EDC 02/04/2022   Squamous cell carcinoma of skin 01/21/2022   R lower leg below knee, EDC 02/04/2022   Squamous cell carcinoma of skin 01/21/2022   SCCIS, R post heel, EDC 02/04/2022   Squamous cell carcinoma of skin 07/01/2022   left lower abdomen, in situ, EDC   Squamous cell carcinoma of skin 07/01/2022   left medial chest, EDC   Urge incontinence     Venous stasis of both lower extremities     Past Surgical History:  Procedure Laterality Date   ABDOMINAL AORTOGRAM W/LOWER EXTREMITY N/A 02/12/2021   Procedure: ABDOMINAL AORTOGRAM W/LOWER EXTREMITY;  Surgeon: Sheree Penne Bruckner, MD;  Location: [facility] INVASIVE CV LAB;  Service: Cardiovascular;  Laterality: N/A;   ABDOMINAL HYSTERECTOMY  1975   for benign causes   APPENDECTOMY     BI-VENTRICULAR PACEMAKER UPGRADE  05/04/2010   BIOPSY  02/28/2019   Procedure: BIOPSY;  Surgeon: Teressa Toribio SQUIBB, MD;  Location: [facility] ENDOSCOPY;  Service: Endoscopy;;   BIOPSY  03/04/2022   Procedure: BIOPSY;  Surgeon: Wilhelmenia Aloha Raddle., MD;  Location: [facility];  Service: Gastroenterology;;   CARDIAC CATHETERIZATION  2006   CARDIOVERSION N/A 02/20/2018   Procedure: CARDIOVERSION;  Surgeon: [name], MD;  Location: [facility] ORS;  Service: Cardiovascular;  Laterality: N/A;   CARDIOVERSION N/A 03/27/2018   Procedure: CARDIOVERSION;  Surgeon: [name], MD;  Location: [facility] ORS;  Service: Cardiovascular;  Laterality: N/A;   CATARACT EXTRACTION W/ INTRAOCULAR LENS  IMPLANT, BILATERAL  01/2006-02-2006   CHOLECYSTECTOMY N/A 11/11/2012   Procedure: LAPAROSCOPIC CHOLECYSTECTOMY WITH INTRAOPERATIVE CHOLANGIOGRAM;  Surgeon: Donnice POUR. Belinda, MD;  Location: [facility] OR;  Service: General;  Laterality: N/A;   COLONOSCOPY WITH PROPOFOL  N/A 02/28/2019   Procedure: COLONOSCOPY WITH PROPOFOL ;  Surgeon: Teressa Toribio SQUIBB, MD;  Location: [facility] ENDOSCOPY;  Service: Endoscopy;  Laterality: N/A;   ENTEROSCOPY N/A 03/30/2020   Procedure: ENTEROSCOPY;  Surgeon: San Sandor GAILS, DO;  Location: [facility] ENDOSCOPY;  Service: Gastroenterology;  Laterality: N/A;   ENTEROSCOPY N/A 02/10/2021   Procedure: ENTEROSCOPY;  Surgeon: Rollin Dover, MD;  Location: [facility] ENDOSCOPY;  Service: Endoscopy;  Laterality: N/A;   ENTEROSCOPY N/A 02/15/2022   Procedure: ENTEROSCOPY;  Surgeon: Federico Rosario BROCKS, MD;  Location: [facility] ENDOSCOPY;  Service:  Gastroenterology;  Laterality: N/A;   ENTEROSCOPY N/A 11/06/2022   Procedure: ENTEROSCOPY;  Surgeon: Wilhelmenia Aloha Raddle., MD;  Location: [facility] ENDOSCOPY;  Service: Gastroenterology;  Laterality: N/A;   EP IMPLANTABLE DEVICE N/A 02/21/2016   Procedure: ICD Generator Changeout;  Surgeon: Elspeth BROCKS Sage, MD;  Location: [facility] INVASIVE CV LAB;  Service: Cardiovascular;  Laterality: N/A;   ESOPHAGOGASTRODUODENOSCOPY N/A 03/04/2022   Procedure: ESOPHAGOGASTRODUODENOSCOPY (EGD);  Surgeon: Wilhelmenia Aloha Raddle., MD;  Location: [facility];  Service: Gastroenterology;  Laterality: N/A;   ESOPHAGOGASTRODUODENOSCOPY (EGD) WITH PROPOFOL  N/A 02/28/2019   Procedure: ESOPHAGOGASTRODUODENOSCOPY (EGD) WITH PROPOFOL ;  Surgeon: Teressa Toribio SQUIBB, MD;  Location: [facility] ENDOSCOPY;  Service: Endoscopy;  Laterality: N/A;   EUS N/A 03/04/2022   Procedure: UPPER ENDOSCOPIC ULTRASOUND (EUS) RADIAL;  Surgeon: Wilhelmenia Aloha Raddle., MD;  Location: [facility] ENDOSCOPY;  Service: Gastroenterology;  Laterality: N/A;   FINE NEEDLE ASPIRATION N/A 03/04/2022   Procedure: FINE NEEDLE ASPIRATION (FNA) LINEAR;  Surgeon: Wilhelmenia Aloha Raddle., MD;  Location: [facility];  Service: Gastroenterology;  Laterality: N/A;   GIVENS CAPSULE STUDY N/A 03/15/2019   Procedure: GIVENS CAPSULE STUDY;  Surgeon: Eda Iha, MD;  Location: [facility] ENDOSCOPY;  Service: Gastroenterology;  Laterality: N/A;   HOT HEMOSTASIS N/A 03/30/2020   Procedure: HOT HEMOSTASIS (ARGON PLASMA COAGULATION/BICAP);  Surgeon: San Sandor GAILS, DO;  Location: [facility] ENDOSCOPY;  Service: Gastroenterology;  Laterality: N/A;   HOT HEMOSTASIS N/A 02/10/2021   Procedure: HOT HEMOSTASIS (ARGON PLASMA COAGULATION/BICAP);  Surgeon: Rollin Dover, MD;  Location: [facility] ENDOSCOPY;  Service: Endoscopy;  Laterality: N/A;   HOT HEMOSTASIS N/A 02/15/2022   Procedure: HOT HEMOSTASIS (ARGON PLASMA COAGULATION/BICAP);  Surgeon: Federico Rosario BROCKS, MD;  Location: [facility] ENDOSCOPY;  Service:  Gastroenterology;  Laterality: N/A;   HOT HEMOSTASIS N/A 11/06/2022   Procedure: HOT HEMOSTASIS (ARGON PLASMA COAGULATION/BICAP);  Surgeon: Wilhelmenia Aloha Raddle., MD;  Location: [facility] ENDOSCOPY;  Service: Gastroenterology;  Laterality: N/A;   INSERT / REPLACE / REMOVE PACEMAKER  05/01/2011   02-28-05-/05-04-10-ICD-MEDTRONIC MAXIMAL DR   IR ANGIOGRAM SELECTIVE EACH ADDITIONAL VESSEL  02/18/2022   IR ANGIOGRAM SELECTIVE EACH ADDITIONAL VESSEL  02/18/2022   IR ANGIOGRAM VISCERAL SELECTIVE  02/16/2022   IR ANGIOGRAM VISCERAL SELECTIVE  02/16/2022   IR EMBO ART  VEN HEMORR LYMPH EXTRAV  INC GUIDE ROADMAPPING  02/16/2022   IR THORACENTESIS ASP PLEURAL SPACE W/IMG GUIDE  02/21/2022   IR THORACENTESIS ASP PLEURAL SPACE W/IMG GUIDE  02/22/2022   IR US  GUIDE VASC ACCESS RIGHT  02/16/2022   JOINT REPLACEMENT     LAPAROSCOPIC CHOLECYSTECTOMY  11/11/2012   LAPAROSCOPIC LYSIS OF ADHESIONS N/A 11/11/2012   Procedure: LAPAROSCOPIC LYSIS OF ADHESIONS;  Surgeon: Donnice POUR. Belinda, MD;  Location: [facility] OR;  Service: General;  Laterality: N/A;   NEPHRECTOMY Right 06/2001    S/P RENAL CELL CANCER   PERIPHERAL VASCULAR INTERVENTION Bilateral 02/12/2021   Procedure: PERIPHERAL VASCULAR INTERVENTION;  Surgeon: Sheree Penne Bruckner, MD;  Location: [facility] INVASIVE CV LAB;  Service: Cardiovascular;  Laterality: Bilateral;  Iliac artery stents   PRESSURE SENSOR/CARDIOMEMS N/A 02/03/2019   Procedure: PRESSURE SENSOR/CARDIOMEMS;  Surgeon: [name], MD;  Location: [facility] INVASIVE CV LAB;  Service: Cardiovascular;  Laterality: N/A;   RIGHT HEART CATH N/A 11/09/2018   Procedure: RIGHT HEART CATH;  Surgeon: [name], MD;  Location: [facility] INVASIVE CV LAB;  Service: Cardiovascular;  Laterality: N/A;   RIGHT HEART CATH N/A 03/08/2019   Procedure: RIGHT HEART CATH;  Surgeon: [name], MD;  Location: [facility] INVASIVE CV LAB;  Service: Cardiovascular;  Laterality: N/A;   SUBMUCOSAL TATTOO INJECTION  02/28/2019    Procedure: SUBMUCOSAL TATTOO INJECTION;  Surgeon: Teressa Toribio SQUIBB, MD;  Location: [facility] ENDOSCOPY;  Service: Endoscopy;;   SUBMUCOSAL TATTOO INJECTION  11/06/2022   Procedure: SUBMUCOSAL TATTOO INJECTION;  Surgeon: Wilhelmenia Aloha Raddle., MD;  Location: [facility] ENDOSCOPY;  Service: Gastroenterology;;   TOTAL HIP ARTHROPLASTY Right 05/03/2011   Procedure: TOTAL HIP ARTHROPLASTY ANTERIOR APPROACH;  Surgeon: Bruckner CINDERELLA Poli;  Location: [facility] ORS;  Service: Orthopedics;  Laterality: Right;  Removal of Cannulated Screws Right Hip, Right Direct Anterior Hip Replacement   TOTAL HIP ARTHROPLASTY Left 10/09/2018   Procedure: TOTAL HIP ARTHROPLASTY ANTERIOR APPROACH;  Surgeon: Fidel Rogue, MD;  Location: [facility] OR;  Service: Orthopedics;  Laterality: Left;    Family History  Problem Relation Age of Onset   Heart failure Mother    Early death Father        car accident  Breast cancer Maternal Aunt 82   Breast cancer Cousin    Breast cancer Other    Colon cancer Neg Hx    Esophageal cancer Neg Hx    Liver disease Neg Hx    Pancreatic cancer Neg Hx    Rectal cancer Neg Hx     Social History:  reports that she quit smoking about 22 years ago. Her smoking use included cigarettes. She started smoking about 62 years ago. She has a 20 pack-year smoking history. She has never used smokeless tobacco. She reports that she does not drink alcohol and does not use drugs.  Allergies:  Allergies  Allergen Reactions   Codeine Nausea And Vomiting and Other (See Comments)    Hallucinations, too- CANNOT HAVE; thinks she may have received Narcan  to reverse the effect   Dilaudid  [Hydromorphone ] Other (See Comments)    Excessive Somnolence- Required Narcan  X2   Tape Other (See Comments)    CAN TOLERATE ONLY EASY-RELEASE, PAPER TAPE, AS THE SKIN IS VERY THIN AND WILL BRUISE AND TEAR VERY EASILY   Amoxil  [Amoxicillin ] Nausea Only   Asa [Aspirin ] Other (See Comments)    Told to avoid due to reduced kidney function,  has 1 remaining kidney   Ms Contin  [Morphine ] Nausea And Vomiting and Other (See Comments)    Hallucinations, also   Neurontin  [Gabapentin ] Other (See Comments)    Out of it   Nsaids Other (See Comments)    Told to avoid due to reduced kidney function, has 1 remaining kidney    Medications: I have reviewed the patient's current medications.  Results for orders placed or performed during the hospital encounter of 04/02/24 (from the past 48 hours)  Magnesium      Status: None   Collection Time: 04/08/24  2:34 AM  Result Value Ref Range   Magnesium  2.1 1.7 - 2.4 mg/dL    Comment: Performed at [facility] Lab, [address] [place], [place] [address]  Basic metabolic panel with GFR     Status: Abnormal   Collection Time: 04/08/24  2:34 AM  Result Value Ref Range   Sodium 135 135 - 145 mmol/L   Potassium 4.2 3.5 - 5.1 mmol/L   Chloride 93 (L) 98 - 111 mmol/L   CO2 33 (H) 22 - 32 mmol/L   Glucose, Bld 99 70 - 99 mg/dL    Comment: Glucose reference range applies only to samples taken after fasting for at least 8 hours.   BUN 44 (H) 8 - 23 mg/dL   Creatinine, Ser 8.48 (H) 0.44 - 1.00 mg/dL   Calcium  8.4 (L) 8.9 - 10.3 mg/dL   GFR, Estimated 33 (L) >60 mL/min    Comment: (NOTE) Calculated using the CKD-EPI Creatinine Equation (2021)    Anion gap 9 5 - 15    Comment: Performed at [facility], [address] [place], [place] [address]  CBC     Status: Abnormal   Collection Time: 04/08/24  2:34 AM  Result Value Ref Range   WBC 4.7 4.0 - 10.5 K/uL   RBC 2.75 (L) 3.87 - 5.11 MIL/uL   Hemoglobin 7.3 (L) 12.0 - 15.0 g/dL   HCT 75.3 (L) 63.9 - 53.9 %   MCV 89.5 80.0 - 100.0 fL   MCH 26.5 26.0 - 34.0 pg   MCHC 29.7 (L) 30.0 - 36.0 g/dL   RDW 79.9 (H) 88.4 - 84.4 %   Platelets 259 150 - 400 K/uL   nRBC 0.6 (  H) 0.0 - 0.2 %    Comment: Performed at [facility], [address] [place], [place] [address]  Type and screen [facility]     Status: None  (Preliminary result)   Collection Time: 04/08/24 10:07 AM  Result Value Ref Range   ABO/RH(D) A NEG    Antibody Screen POS    Sample Expiration 04/11/2024,2359    Antibody Identification ANTI FYA (Duffy a)    Unit Number [id number]    Blood Component Type RBC, LR IRR    Unit division 00    Status of Unit ALLOCATED    Transfusion Status OK TO TRANSFUSE    Crossmatch Result COMPATIBLE    Donor AG Type      NEGATIVE FOR KELL ANTIGEN NEGATIVE FOR DUFFY A ANTIGEN NEGATIVE FOR KIDD B ANTIGEN   Unit Number [id number]    Blood Component Type RBC, LR IRR    Unit division 00    Status of Unit ISSUED,FINAL    Transfusion Status OK TO TRANSFUSE    Crossmatch Result COMPATIBLE    Donor AG Type      NEGATIVE FOR KELL ANTIGEN NEGATIVE FOR DUFFY A ANTIGEN NEGATIVE FOR KIDD B ANTIGEN  Prepare RBC (crossmatch)     Status: None   Collection Time: 04/08/24 10:08 AM  Result Value Ref Range   Order Confirmation      ORDER PROCESSED BY BLOOD BANK Performed at [facility], [address], [place], [place] [address]   Transfusion reaction     Status: None (Preliminary result)   Collection Time: 04/09/24  2:22 AM  Result Value Ref Range   Post RXN DAT IgG NEG    DAT C3      NEG Performed at [facility], [address] [place], [place] [address]    Path interp tx rxn PENDING   Basic metabolic panel with GFR     Status: Abnormal   Collection Time: 04/09/24  2:43 AM  Result Value Ref Range   Sodium 132 (L) 135 - 145 mmol/L   Potassium 5.0 3.5 - 5.1 mmol/L   Chloride 87 (L) 98 - 111 mmol/L   CO2 31 22 - 32 mmol/L   Glucose, Bld 132 (H) 70 - 99 mg/dL    Comment: Glucose reference range applies only to samples taken after fasting for at least 8 hours.   BUN 42 (H) 8 - 23 mg/dL   Creatinine, Ser 8.51 (H) 0.44 - 1.00 mg/dL   Calcium  8.6 (L) 8.9 - 10.3 mg/dL   GFR, Estimated 34 (L) >60 mL/min    Comment: (NOTE) Calculated using the CKD-EPI Creatinine Equation (2021)    Anion  gap 14 5 - 15    Comment: Performed at [facility], [address] [place], [place] [address]  CBC with Differential/Platelet     Status: Abnormal   Collection Time: 04/09/24  2:43 AM  Result Value Ref Range   WBC 11.0 (H) 4.0 - 10.5 K/uL   RBC 3.24 (L) 3.87 - 5.11 MIL/uL   Hemoglobin 8.9 (L) 12.0 - 15.0 g/dL   HCT 71.2 (L) 63.9 - 53.9 %   MCV 88.6 80.0 - 100.0 fL   MCH 27.5 26.0 - 34.0 pg   MCHC 31.0 30.0 - 36.0 g/dL   RDW 80.2 (H) 88.4 - 84.4 %   Platelets 278 150 - 400 K/uL   nRBC 0.0 0.0 - 0.2 %   Neutrophils Relative %  89 %   Neutro Abs 9.8 (H) 1.7 - 7.7 K/uL   Lymphocytes Relative 2 %   Lymphs Abs 0.3 (L) 0.7 - 4.0 K/uL   Monocytes Relative 8 %   Monocytes Absolute 0.8 0.1 - 1.0 K/uL   Eosinophils Relative 0 %   Eosinophils Absolute 0.0 0.0 - 0.5 K/uL   Basophils Relative 0 %   Basophils Absolute 0.0 0.0 - 0.1 K/uL   Immature Granulocytes 1 %   Abs Immature Granulocytes 0.06 0.00 - 0.07 K/uL    Comment: Performed at [facility], [address] [place], [place] [address]   *Note: Due to a large number of results and/or encounters for the requested time period, some results have not been displayed. A complete set of results can be found in Results Review.    DG Chest Port 1 View Result Date: 04/09/2024 EXAM: 1 VIEW(S) XRAY OF THE CHEST 04/09/2024 12:07:55 AM COMPARISON: 04/02/2024 CLINICAL HISTORY: SOB (shortness of breath) FINDINGS: LUNGS AND PLEURA: Small bilateral pleural effusions. Chronic coarsened interstitial markings. Left basilar opacity. No pneumothorax. HEART AND MEDIASTINUM: Cardiomegaly. Atherosclerotic calcifications. Left chest AICD/pacemaker in place. BONES AND SOFT TISSUES: Similar sclerotic lesion of the left proximal humerus. IMPRESSION: 1. Chronic coarsened interstitial markings consistent with edema. Small bilateral pleural effusions. 2. Left basilar opacity, which may reflect atelectasis or superimposed infection. Electronically signed by:  Norman Gatlin MD 04/09/2024 12:14 AM EST RP Workstation: [id number]    ROS MSK: endorses discomfort LLE Skin: denies any new bleeding since yesterday   Blood pressure (!) 149/89, pulse 62, temperature 97.7 F (36.5 C), temperature source Oral, resp. rate 18, height 5' 6 (1.676 m), weight 56 kg, SpO2 100%. Physical Exam Constitutional:      General: She is not in acute distress. Pulmonary:     Effort: Pulmonary effort is normal.  Musculoskeletal:        General: Tenderness present.     Comments: Left knee: ecchymoses present  Skin:    Comments: LLE: Skin tear anteriorly along the shin.  Area measures 16 x 5 cm.  Skin edge loosely approximated with blood-stained Steri-Strips remaining in place.  No active bleeding noted.  No skin induration or erythema otherwise concerning for cellulitis. The epidermis is compromised and will likely slough off.   Neurological:     Mental Status: She is alert.       Assessment/Plan:  LLE wound: - No active bleeding and more suitable for local dressing changes rather than operative debridement. No evidence concerned for infection. - Discussed case with Dr. Waddell who reviewed images and is agreeable to assessment and plan.  Wound care: - Gently cleanse with wound cleanser then place nonadherent Telfa pad followed by Xeroform, ABD pad, Kerlix, and Ace wrap.  Change daily.   Honora Seip 04/09/2024, 4:04 PM

## 2024-04-09 NOTE — TOC Progression Note (Addendum)
 Transition of Care [facility]) - Progression Note    Patient Details  Name: [name] MRN: [id number] Date of Birth: 05-17-1933  Transition of [facility]) CM/SW Contact  Luise JAYSON Pan, [place] Phone Number: 04/09/2024, 9:58 AM  Clinical Narrative:   CSW called [name] but unable to leave a voicemail at this time.   10:21 AM CSW spoke with [name] about insurance denial for STR and patients right to appeal insurance decision. CSW also reviewed the conversation CSW had with Dr. Janit yesterday. [name] did not express interest in wanting to appeal at this time. [name] stated the Hospitalist spoke with patient to discuss potentially discharging home with hospice. [name] stated her and her mother will discuss the discharge plan moving forward. CSW informed [name], patient may qualify for Home Health services if family is not wanting hospice at this time. CSW notified MD and RNCM of above conversation.    11:25 AM Damien with [facility] called CSW to inform about insurance denial. [facility] to fax CSW SNF denial to give to patient.   CSW will continue to follow.    Expected Discharge Plan: Skilled Nursing Facility Barriers to Discharge: Continued Medical Work up               Expected Discharge Plan and Services In-house Referral: Clinical Social Work   Post Acute Care Choice: Skilled Nursing Facility Living arrangements for the past 2 months: Single Family Home                                       Social Drivers of Health (SDOH) Interventions SDOH Screenings   Food Insecurity: No Food Insecurity (04/02/2024)  Housing: Low Risk  (04/02/2024)  Transportation Needs: No Transportation Needs (04/02/2024)  Utilities: Not At Risk (04/02/2024)  Alcohol Screen: Low Risk  (11/06/2023)  Depression (PHQ2-9): Low Risk  (03/01/2024)  Financial Resource Strain: Low Risk  (11/06/2023)  Physical Activity: Insufficiently Active (11/06/2023)  Social Connections: Socially Isolated (04/02/2024)  Stress: No  Stress Concern Present (11/06/2023)  Tobacco Use: Medium Risk (04/02/2024)  Health Literacy: Adequate Health Literacy (11/06/2023)    Readmission Risk Interventions    06/17/2023   10:07 AM 01/31/2023    3:53 PM 11/09/2022   12:00 PM  Readmission Risk Prevention Plan  Transportation Screening Complete Complete Complete  PCP or Specialist Appt within 3-5 Days   Complete  HRI or Home Care Consult  Complete Complete  Social Work Consult for Recovery Care Planning/Counseling   Complete  Palliative Care Screening  Not Applicable   Medication Review Oceanographer)  Complete Complete  HRI or Home Care Consult Complete    SW Recovery Care/Counseling Consult Complete    Palliative Care Screening Not Applicable    Skilled Nursing Facility Not Applicable

## 2024-04-10 ENCOUNTER — Inpatient Hospital Stay (HOSPITAL_COMMUNITY)

## 2024-04-10 DIAGNOSIS — Z95810 Presence of automatic (implantable) cardiac defibrillator: Secondary | ICD-10-CM | POA: Diagnosis not present

## 2024-04-10 DIAGNOSIS — I2723 Pulmonary hypertension due to lung diseases and hypoxia: Secondary | ICD-10-CM | POA: Diagnosis not present

## 2024-04-10 DIAGNOSIS — I5033 Acute on chronic diastolic (congestive) heart failure: Secondary | ICD-10-CM | POA: Diagnosis not present

## 2024-04-10 DIAGNOSIS — R918 Other nonspecific abnormal finding of lung field: Secondary | ICD-10-CM | POA: Diagnosis not present

## 2024-04-10 DIAGNOSIS — J90 Pleural effusion, not elsewhere classified: Secondary | ICD-10-CM | POA: Diagnosis not present

## 2024-04-10 LAB — BASIC METABOLIC PANEL WITH GFR
Anion gap: 11 (ref 5–15)
BUN: 51 mg/dL — ABNORMAL HIGH (ref 8–23)
CO2: 32 mmol/L (ref 22–32)
Calcium: 8.6 mg/dL — ABNORMAL LOW (ref 8.9–10.3)
Chloride: 92 mmol/L — ABNORMAL LOW (ref 98–111)
Creatinine, Ser: 1.87 mg/dL — ABNORMAL HIGH (ref 0.44–1.00)
GFR, Estimated: 26 mL/min — ABNORMAL LOW (ref >60)
Glucose, Bld: 113 mg/dL — ABNORMAL HIGH (ref 70–99)
Potassium: 3.7 mmol/L (ref 3.5–5.1)
Sodium: 135 mmol/L (ref 135–145)

## 2024-04-10 MED ORDER — OXYCODONE HCL 5 MG PO TABS
5.0000 mg | ORAL_TABLET | Freq: Four times a day (QID) | ORAL | Status: DC | PRN
  Filled 2024-04-10: qty 1

## 2024-04-10 MED ORDER — TRAMADOL HCL 50 MG PO TABS
50.0000 mg | ORAL_TABLET | Freq: Three times a day (TID) | ORAL | Status: DC | PRN
  Administered 2024-04-10 – 2024-04-12 (×5): 50 mg via ORAL
  Filled 2024-04-10 (×5): qty 1

## 2024-04-10 MED ORDER — TORSEMIDE 20 MG PO TABS: 60.0000 mg | ORAL_TABLET | Freq: Two times a day (BID) | ORAL | Status: DC

## 2024-04-10 NOTE — Plan of Care (Signed)
  Problem: Clinical Measurements: Goal: Ability to avoid or minimize complications of infection will improve Outcome: Progressing   Problem: Skin Integrity: Goal: Skin integrity will improve Outcome: Progressing   Problem: Health Behavior/Discharge Planning: Goal: Ability to manage health-related needs will improve Outcome: Progressing   Problem: Activity: Goal: Risk for activity intolerance will decrease Outcome: Progressing

## 2024-04-10 NOTE — Progress Notes (Addendum)
 Patient relayed 8/10 leg and rib pain reassessment from the prn PO tramadol  adminstered, RN notified MD. MD ordered 5mg  PO oxy IR to be given. RN rounded to adminstered oxy and patient refused. Patient states  I dont take any narcotics. RN returned oxy and update the MD. RN repositioned patient.

## 2024-04-10 NOTE — Plan of Care (Signed)
  Problem: Skin Integrity: Goal: Skin integrity will improve Outcome: Progressing dressing changes adequate

## 2024-04-10 NOTE — Progress Notes (Addendum)
 PROGRESS NOTE  [name] FMW:[id number] DOB: 01-Aug-1933 DOA: 04/02/2024 PCP: [name], MD   LOS: 8 days   Brief narrative:  89/F with COPD on home O2, chronic diastolic CHF, RV failure, chronic leg wounds, ICD, paroxysmal A-fib not on anticoagulation, pancreatic CA Hello presenting to the ED with weakness, shortness of breath, dyspnea on exertion, in the ER she was volume overloaded, labs noted hypokalemia, creatinine 1.5, hemoglobin 9.2, BNP 354, chest x-ray noted mild pulmonary edema and diffuse interstitial markings - Admitted started on diuretics and antibiotics for cellulitis  - Cardiology consulted, recommended palliative care - Palliative care meeting completed, patient refuses hospice - Large laceration with open wound edges despite sutures, seen by plastic surgery, recommended wound care  Assessment/Plan:  Acute on chronic diastolic CHF exacerbation.   RV Failure History of pulmonary hypertension - Followed by advanced heart failure team as outpatient, not a candidate for pulmonary vasodilators  -Repeat 2D echo this admission noted EF 55-60%, grade 3 DD, moderately reduced RV, severely elevated PASP of 91, moderate aortic stenosis - Cards following, recommended palliative care and signed off , patient declines hospice services at this time, wishes to discharge to rehab when improved - Volume status is improved, weight down 16 LB, switch to oral torsemide  60 mg twice daily, on 100 mg twice daily at baseline, hold today's dose given rising creatinine - Plan was to optimize and then discharge to SNF with palliative care, high risk of quick rehospitalization, rehab declined by insurance for rehab, discussed with patient and daughter regarding hospice again yesterday - Disposition to be determined, TOC following  Large left lower extremity laceration sp fall chronic lower extremity wounds -sp sutures placed in the ED 12/28, wound edges not approximated, intermittent  bleeding - Requested plastic surgery evaluation> appreciate input, recommended to continue wound - Continue oral Keflex  for 2 more days  Hypomagnesemia.  Repleted  Hypokalemia.  Improved.  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  Has had transfusions and IV iron  in the past.  Off anticoagulation at this time.  Recent anemia panel with iron  deficiency - Hemoglobin down to 7.3, given IV iron  and PRBC, hemoglobin improving  Permanent atrial fibrillation.   Has failed amiodarone  Tikosyn  and flecainide  in the past and had multiple cardioversions.  -Not a good anticoagulation candidate with recurrent GI bleeds and small bowel AVMs.  Rate is controlled at this time.    Valvular heart disease.  History of tricuspid regurgitation and moderate mitral and aortic stenosis, pulmonary hypertension..  Not a surgical candidate.  Seen by advanced heart failure team as outpatient.    COPD with chronic hypoxic respiratory failure.  Patient is on 2 L of oxygen  at baseline at home.  Continue nebulizers, incentive spirometer.   Pancreatic neuroendocrine tumor on Sandostatin .  Follows up with oncology Dr Lanny as outpatient. Has had transfusions in the past.    Deconditioning, debility, fall frailty, advanced age.  PT has recommended skilled nursing facility placement at this time.  Overall prognosis of patient is poor.  Goals of care discussion.  Palliative care has been consulted.  At this time patient wishes to continue current level of care refuses hospice at this time, at risk of ongoing decompensation  DVT prophylaxis: heparin  injection 5,000 Units Start: 04/02/24 2200   Disposition: Skilled nursing facility with outpatient palliative care Code Status:     Code Status: Limited: Do not attempt  resuscitation (DNR) -DNR-LIMITED -Do Not Intubate/DNI  Family Communication:.  No family at bedside, updated daughter Disposition:  Unclear, insurance declined rehab, patient refuses hospice and has limited support at home  Consultants: Cardiology Palliative care  Procedures: Left leg laceration suture in the ED    Subjective: Dyspnea is fair today, was acutely short of breath overnight, cough is chronic, has some pain in the rib cage after her fall  Vitals:   04/10/24 0854 04/10/24 0902  BP: (!) 119/45 (!) 120/51  Pulse: (!) 59 60  Resp: 18 16  Temp: 97.8 F (36.6 C)   SpO2: 100% 100%    Intake/Output Summary (Last 24 hours) at 04/10/2024 1049 Last data filed at 04/10/2024 0910 Gross per 24 hour  Intake 240 ml  Output 500 ml  Net -260 ml   Filed Weights   04/08/24 0500 04/09/24 0528 04/10/24 0347  Weight: 55.4 kg 56 kg 55.9 kg   Body mass index is 19.89 kg/m.   Physical Exam:  General: Chronically ill frail and debilitated female laying in bed AO x 3 HEENT: No JVD CVS: S1-S2, irregular rhythm Lungs: Poor air movement bilaterally, few coarse rhonchi Abdomen: Soft, nontender, bowel sounds present Remedies: Trace edema, chronic skin changes, large laceration left lower leg with sutures, dressing today  Data Review: I have personally reviewed the following laboratory data and studies,  CBC: Recent Labs  Lab 04/05/24 0320 04/06/24 0339 04/07/24 0259 04/08/24 0234 04/09/24 0243  WBC 5.2 5.3 4.5 4.7 11.0*  NEUTROABS  --   --   --   --  9.8*  HGB 7.4* 7.4* 7.3* 7.3* 8.9*  HCT 24.8* 24.4* 24.3* 24.6* 28.7*  MCV 89.5 89.7 89.0 89.5 88.6  PLT 211 190 217 259 278   Basic Metabolic Panel: Recent Labs  Lab 04/04/24 0232 04/05/24 0320 04/06/24 0339 04/07/24 0259 04/08/24 0234 04/09/24 0243 04/10/24 0223  NA 135 137 137 137 135 132* 135  K 4.9 4.6 5.0 4.8 4.2 5.0 3.7  CL 101 99 96* 92* 93* 87* 92*  CO2 24 29 31  33* 33* 31 32  GLUCOSE 109* 121* 113* 106* 99 132* 113*  BUN 39* 39* 42* 45* 44* 42* 51*  CREATININE 1.45* 1.32* 1.42* 1.39* 1.51* 1.48* 1.87*  CALCIUM  8.8* 9.0 8.9 8.8* 8.4*  8.6* 8.6*  MG 1.9 1.8 2.4 2.1 2.1  --   --   PHOS  --   --  3.2 3.1  --   --   --    Liver Function Tests: No results for input(s): AST, ALT, ALKPHOS, BILITOT, PROT, ALBUMIN  in the last 168 hours.  No results for input(s): LIPASE, AMYLASE in the last 168 hours. No results for input(s): AMMONIA in the last 168 hours. Cardiac Enzymes: No results for input(s): CKTOTAL, CKMB, CKMBINDEX, TROPONINI in the last 168 hours.  BNP (last 3 results) Recent Labs    06/24/23 1621 07/10/23 1641 04/02/24 1537  BNP 516.7* 322.2* 354.1*    ProBNP (last 3 results) No results for input(s): PROBNP in the last 8760 hours.  CBG: No results for input(s): GLUCAP in the last 168 hours. Recent Results (from the past 240 hours)  Resp panel by RT-PCR (RSV, Flu A&B, Covid) Anterior Nasal Swab     Status: None   Collection Time: 04/02/24  1:57 PM   Specimen: Anterior Nasal Swab  Result Value Ref Range Status   SARS Coronavirus 2 by RT PCR NEGATIVE NEGATIVE Final   Influenza A by PCR NEGATIVE NEGATIVE Final  Influenza B by PCR NEGATIVE NEGATIVE Final    Comment: (NOTE) The Xpert Xpress SARS-CoV-2/FLU/RSV plus assay is intended as an aid in the diagnosis of influenza from Nasopharyngeal swab specimens and should not be used as a sole basis for treatment. Nasal washings and aspirates are unacceptable for Xpert Xpress SARS-CoV-2/FLU/RSV testing.  Fact Sheet for Patients: bloggercourse.com  Fact Sheet for Healthcare Providers: seriousbroker.it  This test is not yet approved or cleared by the [place]  FDA and has been authorized for detection and/or diagnosis of SARS-CoV-2 by FDA under an Emergency Use Authorization (EUA). This EUA will remain in effect (meaning this test can be used) for the duration of the COVID-19 declaration under Section 564(b)(1) of the Act, 21 U.S.C. section 360bbb-3(b)(1), unless the  authorization is terminated or revoked.     Resp Syncytial Virus by PCR NEGATIVE NEGATIVE Final    Comment: (NOTE) Fact Sheet for Patients: bloggercourse.com  Fact Sheet for Healthcare Providers: seriousbroker.it  This test is not yet approved or cleared by the [place]  FDA and has been authorized for detection and/or diagnosis of SARS-CoV-2 by FDA under an Emergency Use Authorization (EUA). This EUA will remain in effect (meaning this test can be used) for the duration of the COVID-19 declaration under Section 564(b)(1) of the Act, 21 U.S.C. section 360bbb-3(b)(1), unless the authorization is terminated or revoked.  Performed at [facility], [address] [place], [place] [address]   Blood culture (routine x 2)     Status: None   Collection Time: 04/02/24  1:58 PM   Specimen: BLOOD  Result Value Ref Range Status   Specimen Description BLOOD RIGHT ANTECUBITAL  Final   Special Requests   Final    BOTTLES DRAWN AEROBIC AND ANAEROBIC Blood Culture results may not be optimal due to an inadequate volume of blood received in culture bottles   Culture   Final    NO GROWTH 5 DAYS Performed at [facility], [address] [place], [place] [address]    Report Status 04/07/2024 FINAL  Final  Blood culture (routine x 2)     Status: None   Collection Time: 04/02/24  2:03 PM   Specimen: BLOOD  Result Value Ref Range Status   Specimen Description BLOOD RIGHT ANTECUBITAL  Final   Special Requests   Final    BOTTLES DRAWN AEROBIC AND ANAEROBIC Blood Culture results may not be optimal due to an inadequate volume of blood received in culture bottles   Culture   Final    NO GROWTH 5 DAYS Performed at [facility], [address], [place], [place] [address]    Report Status 04/07/2024 FINAL  Final     Studies: DG CHEST PORT 1 VIEW Result Date: 04/10/2024 EXAM: 1 VIEW(S) XRAY OF THE CHEST 04/10/2024 08:34:00 AM  COMPARISON: 04/08/2024 CLINICAL HISTORY: Dyspnea FINDINGS: LINES, TUBES AND DEVICES: Stable left chest AICD. LUNGS AND PLEURA: Small bilateral pleural effusions. Dense left lung base opacification, mildly increased, possibly from pneumonia or atelectasis. Stable findings of pulmonary venous hypertension. No pneumothorax. HEART AND MEDIASTINUM: Stable cardiomegaly and mediastinal contours. Calcified aortic atherosclerosis. BONES AND SOFT TISSUES: Stable visualized osseous structures, including benign sclerotic lesion of left proximal humerus. IMPRESSION: 1. Dense left lung base opacification, mildly increased, which may reflect pneumonia or atelectasis. 2. Small bilateral pleural effusions. 3. Stable cardiomegaly with pulmonary venous hypertension, stable mediastinal contours, left chest AICD, and calcified aortic atherosclerosis. 4. Several additional stable chronic and incidental findings are  present, as detailed in the body of the report. Electronically signed by: Ryan Salvage MD 04/10/2024 09:05 AM EST RP Workstation: [id number]   DG Chest Port 1 View Result Date: 04/09/2024 EXAM: 1 VIEW(S) XRAY OF THE CHEST 04/09/2024 12:07:55 AM COMPARISON: 04/02/2024 CLINICAL HISTORY: SOB (shortness of breath) FINDINGS: LUNGS AND PLEURA: Small bilateral pleural effusions. Chronic coarsened interstitial markings. Left basilar opacity. No pneumothorax. HEART AND MEDIASTINUM: Cardiomegaly. Atherosclerotic calcifications. Left chest AICD/pacemaker in place. BONES AND SOFT TISSUES: Similar sclerotic lesion of the left proximal humerus. IMPRESSION: 1. Chronic coarsened interstitial markings consistent with edema. Small bilateral pleural effusions. 2. Left basilar opacity, which may reflect atelectasis or superimposed infection. Electronically signed by: Norman Gatlin MD 04/09/2024 12:14 AM EST RP Workstation: [id number]       Sigurd Pac, MD  [facility] 04/10/2024  If 7PM-7AM, please contact  night-coverage

## 2024-04-11 DIAGNOSIS — I5033 Acute on chronic diastolic (congestive) heart failure: Secondary | ICD-10-CM | POA: Diagnosis not present

## 2024-04-11 LAB — URINALYSIS, ROUTINE W REFLEX MICROSCOPIC
Bilirubin Urine: NEGATIVE
Glucose, UA: NEGATIVE mg/dL
Hgb urine dipstick: NEGATIVE
Ketones, ur: NEGATIVE mg/dL
Nitrite: NEGATIVE
Protein, ur: NEGATIVE mg/dL
Specific Gravity, Urine: 1.012 (ref 1.005–1.030)
pH: 6 (ref 5.0–8.0)

## 2024-04-11 LAB — BASIC METABOLIC PANEL WITH GFR
Anion gap: 6 (ref 5–15)
BUN: 47 mg/dL — ABNORMAL HIGH (ref 8–23)
CO2: 38 mmol/L — ABNORMAL HIGH (ref 22–32)
Calcium: 8.4 mg/dL — ABNORMAL LOW (ref 8.9–10.3)
Chloride: 91 mmol/L — ABNORMAL LOW (ref 98–111)
Creatinine, Ser: 1.72 mg/dL — ABNORMAL HIGH (ref 0.44–1.00)
GFR, Estimated: 28 mL/min — ABNORMAL LOW (ref >60)
Glucose, Bld: 81 mg/dL (ref 70–99)
Potassium: 3.7 mmol/L (ref 3.5–5.1)
Sodium: 135 mmol/L (ref 135–145)

## 2024-04-11 LAB — CBC
HCT: 27.3 % — ABNORMAL LOW (ref 36.0–46.0)
Hemoglobin: 8.2 g/dL — ABNORMAL LOW (ref 12.0–15.0)
MCH: 27.3 pg (ref 26.0–34.0)
MCHC: 30 g/dL (ref 30.0–36.0)
MCV: 91 fL (ref 80.0–100.0)
Platelets: 232 K/uL (ref 150–400)
RBC: 3 MIL/uL — ABNORMAL LOW (ref 3.87–5.11)
RDW: 20.8 % — ABNORMAL HIGH (ref 11.5–15.5)
WBC: 4.1 K/uL (ref 4.0–10.5)
nRBC: 0 % (ref 0.0–0.2)

## 2024-04-11 MED ORDER — TORSEMIDE 20 MG PO TABS: 80.0000 mg | ORAL_TABLET | Freq: Two times a day (BID) | ORAL | Status: DC

## 2024-04-11 MED ORDER — TORSEMIDE 100 MG PO TABS
100.0000 mg | ORAL_TABLET | Freq: Two times a day (BID) | ORAL | Status: DC
  Administered 2024-04-11 – 2024-04-12 (×3): 100 mg via ORAL
  Filled 2024-04-11 (×3): qty 1

## 2024-04-11 MED ORDER — POTASSIUM CHLORIDE CRYS ER 20 MEQ PO TBCR
60.0000 meq | EXTENDED_RELEASE_TABLET | Freq: Every day | ORAL | Status: AC
  Administered 2024-04-11 – 2024-04-12 (×2): 60 meq via ORAL
  Filled 2024-04-11 (×2): qty 3

## 2024-04-11 NOTE — Progress Notes (Signed)
 PROGRESS NOTE  [name] FMW:[id number] DOB: [birth year] DOA: 04/02/2024 PCP: [name], MD   LOS: 9 days   Brief narrative:  89/F with COPD on home O2, chronic diastolic CHF, RV failure, chronic leg wounds, ICD, paroxysmal A-fib not on anticoagulation, pancreatic CA Hello presenting to the ED with weakness, shortness of breath, dyspnea on exertion, in the ER she was volume overloaded, labs noted hypokalemia, creatinine 1.5, hemoglobin 9.2, BNP 354, chest x-ray noted mild pulmonary edema and diffuse interstitial markings - Admitted started on diuretics and antibiotics for cellulitis  - Cardiology consulted, recommended palliative care - Palliative care meeting completed, patient refuses hospice - Large laceration with open wound edges despite sutures, seen by plastic surgery, recommended wound care  Assessment/Plan:  Acute on chronic diastolic CHF exacerbation.   RV Failure History of pulmonary hypertension - Followed by advanced heart failure team as outpatient, not a candidate for pulmonary vasodilators  -Repeat 2D echo this admission noted EF 55-60%, grade 3 DD, moderately reduced RV, severely elevated PASP of 91, moderate aortic stenosis - Cards following, recommended palliative care and signed off , patient declines hospice services at this time, wishes to discharge to rehab when improved - Volume status is improved, weight down 18 LB switch to home regimen of torsemide  100 mg twice daily - Plan was to optimize and then discharge to SNF with palliative care, high risk of quick rehospitalization, rehab declined by insurance for rehab, discussed with patient and daughter regarding hospice again, patient wishes to defer hospice till next time - Plan for discharge home with home health services tomorrow - Unfortunately anticipate quick rehospitalization  Large left lower extremity laceration sp fall chronic lower extremity wounds -sp sutures placed in the ED 12/28, wound  edges not approximated, intermittent bleeding - Requested plastic surgery evaluation> appreciate input, recommended to continue wound - Continue oral Keflex  for 1 more day  Hypomagnesemia.  Repleted  Hypokalemia.  Improved.  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  Has had transfusions and IV iron  in the past.  Off anticoagulation at this time.  Recent anemia panel with iron  deficiency - Hemoglobin down to 7.3 earlier, given IV iron  and PRBC, hemoglobin improving  Permanent atrial fibrillation.   Has failed amiodarone  Tikosyn  and flecainide  in the past and had multiple cardioversions.  -Not a good anticoagulation candidate with recurrent GI bleeds and small bowel AVMs.  Rate is controlled at this time.    Valvular heart disease.  History of tricuspid regurgitation and moderate mitral and aortic stenosis, pulmonary hypertension..  Not a surgical candidate.  Seen by advanced heart failure team as outpatient.    COPD with chronic hypoxic respiratory failure.  On 2-3 L of oxygen  at baseline at home.  Continue nebulizers, incentive spirometer.   Pancreatic neuroendocrine tumor on Sandostatin .  Follows up with oncology Dr Lanny as outpatient. Has had transfusions in the past.    Deconditioning, debility, fall frailty, advanced age.  PT has recommended skilled nursing facility placement at this time.  Overall prognosis of patient is poor.  Goals of care discussion.  Palliative care has been consulted.  At this time patient wishes to continue current level of care refuses hospice at this time, at risk of ongoing decompensation  DVT prophylaxis: heparin  injection 5,000 Units Start: 04/02/24 2200   Disposition: Skilled nursing facility with outpatient palliative care Code Status:     Code Status: Limited:  Do not attempt resuscitation (DNR) -DNR-LIMITED -Do Not Intubate/DNI  Family Communication:.  No family at bedside,  updated daughter Disposition: Unclear, insurance declined rehab, patient refuses hospice and has limited support at home  Consultants: Cardiology Palliative care  Procedures: Left leg laceration suture in the ED    Subjective: Feels fair, mild dyspnea overnight, intermittent cough, has some chest wall pain on the left lower left ribs  Vitals:   04/11/24 0600 04/11/24 0707  BP: 130/66 (!) 130/55  Pulse: 60 (!) 59  Resp: 18 13  Temp:  (!) 97.5 F (36.4 C)  SpO2: 95% 100%    Intake/Output Summary (Last 24 hours) at 04/11/2024 0958 Last data filed at 04/11/2024 9394 Gross per 24 hour  Intake 410 ml  Output 550 ml  Net -140 ml   Filed Weights   04/09/24 0528 04/10/24 0347 04/11/24 0430  Weight: 56 kg 55.9 kg 54.9 kg   Body mass index is 19.54 kg/m.   Physical Exam:  General: Chronically ill frail and debilitated female laying in bed AO x 3, no distress today HEENT: No JVD CVS: S1-S2, irregular rhythm Lungs: Poor air movement bilaterally, few coarse rhonchi, decreased at the bases Abdomen: Soft, nontender, bowel sounds present Remedies: Trace edema, chronic skin changes, large laceration left lower leg with sutures and dressing  Data Review: I have personally reviewed the following laboratory data and studies,  CBC: Recent Labs  Lab 04/06/24 0339 04/07/24 0259 04/08/24 0234 04/09/24 0243 04/11/24 0252  WBC 5.3 4.5 4.7 11.0* 4.1  NEUTROABS  --   --   --  9.8*  --   HGB 7.4* 7.3* 7.3* 8.9* 8.2*  HCT 24.4* 24.3* 24.6* 28.7* 27.3*  MCV 89.7 89.0 89.5 88.6 91.0  PLT 190 217 259 278 232   Basic Metabolic Panel: Recent Labs  Lab 04/05/24 0320 04/06/24 0339 04/07/24 0259 04/08/24 0234 04/09/24 0243 04/10/24 0223 04/11/24 0252  NA 137 137 137 135 132* 135 135  K 4.6 5.0 4.8 4.2 5.0 3.7 3.7  CL 99 96* 92* 93* 87* 92* 91*  CO2 29 31 33* 33* 31 32 38*  GLUCOSE 121* 113* 106* 99 132* 113* 81  BUN 39* 42* 45* 44* 42* 51* 47*  CREATININE 1.32* 1.42* 1.39*  1.51* 1.48* 1.87* 1.72*  CALCIUM  9.0 8.9 8.8* 8.4* 8.6* 8.6* 8.4*  MG 1.8 2.4 2.1 2.1  --   --   --   PHOS  --  3.2 3.1  --   --   --   --    Liver Function Tests: No results for input(s): AST, ALT, ALKPHOS, BILITOT, PROT, ALBUMIN  in the last 168 hours.  No results for input(s): LIPASE, AMYLASE in the last 168 hours. No results for input(s): AMMONIA in the last 168 hours. Cardiac Enzymes: No results for input(s): CKTOTAL, CKMB, CKMBINDEX, TROPONINI in the last 168 hours.  BNP (last 3 results) Recent Labs    06/24/23 1621 07/10/23 1641 04/02/24 1537  BNP 516.7* 322.2* 354.1*    ProBNP (last 3 results) No results for input(s): PROBNP in the last 8760 hours.  CBG: No results for input(s): GLUCAP in the last 168 hours. Recent Results (from the past 240 hours)  Resp panel by RT-PCR (RSV, Flu A&B, Covid) Anterior Nasal Swab     Status: None   Collection Time: 04/02/24  1:57 PM   Specimen: Anterior Nasal Swab  Result Value Ref Range Status   SARS Coronavirus 2 by RT PCR NEGATIVE NEGATIVE Final  Influenza A by PCR NEGATIVE NEGATIVE Final   Influenza B by PCR NEGATIVE NEGATIVE Final    Comment: (NOTE) The Xpert Xpress SARS-CoV-2/FLU/RSV plus assay is intended as an aid in the diagnosis of influenza from Nasopharyngeal swab specimens and should not be used as a sole basis for treatment. Nasal washings and aspirates are unacceptable for Xpert Xpress SARS-CoV-2/FLU/RSV testing.  Fact Sheet for Patients: bloggercourse.com  Fact Sheet for Healthcare Providers: seriousbroker.it  This test is not yet approved or cleared by the [place]  FDA and has been authorized for detection and/or diagnosis of SARS-CoV-2 by FDA under an Emergency Use Authorization (EUA). This EUA will remain in effect (meaning this test can be used) for the duration of the COVID-19 declaration under Section 564(b)(1) of the  Act, 21 U.S.C. section 360bbb-3(b)(1), unless the authorization is terminated or revoked.     Resp Syncytial Virus by PCR NEGATIVE NEGATIVE Final    Comment: (NOTE) Fact Sheet for Patients: bloggercourse.com  Fact Sheet for Healthcare Providers: seriousbroker.it  This test is not yet approved or cleared by the [place]  FDA and has been authorized for detection and/or diagnosis of SARS-CoV-2 by FDA under an Emergency Use Authorization (EUA). This EUA will remain in effect (meaning this test can be used) for the duration of the COVID-19 declaration under Section 564(b)(1) of the Act, 21 U.S.C. section 360bbb-3(b)(1), unless the authorization is terminated or revoked.  Performed at [facility], [address] [place], [place] [address]   Blood culture (routine x 2)     Status: None   Collection Time: 04/02/24  1:58 PM   Specimen: BLOOD  Result Value Ref Range Status   Specimen Description BLOOD RIGHT ANTECUBITAL  Final   Special Requests   Final    BOTTLES DRAWN AEROBIC AND ANAEROBIC Blood Culture results may not be optimal due to an inadequate volume of blood received in culture bottles   Culture   Final    NO GROWTH 5 DAYS Performed at [facility], [address] [place], [place] [address]    Report Status 04/07/2024 FINAL  Final  Blood culture (routine x 2)     Status: None   Collection Time: 04/02/24  2:03 PM   Specimen: BLOOD  Result Value Ref Range Status   Specimen Description BLOOD RIGHT ANTECUBITAL  Final   Special Requests   Final    BOTTLES DRAWN AEROBIC AND ANAEROBIC Blood Culture results may not be optimal due to an inadequate volume of blood received in culture bottles   Culture   Final    NO GROWTH 5 DAYS Performed at [facility], [address], [place], [place] [address]    Report Status 04/07/2024 FINAL  Final     Studies: DG CHEST PORT 1 VIEW Result Date: 04/10/2024 EXAM: 1  VIEW(S) XRAY OF THE CHEST 04/10/2024 08:34:00 AM COMPARISON: 04/08/2024 CLINICAL HISTORY: Dyspnea FINDINGS: LINES, TUBES AND DEVICES: Stable left chest AICD. LUNGS AND PLEURA: Small bilateral pleural effusions. Dense left lung base opacification, mildly increased, possibly from pneumonia or atelectasis. Stable findings of pulmonary venous hypertension. No pneumothorax. HEART AND MEDIASTINUM: Stable cardiomegaly and mediastinal contours. Calcified aortic atherosclerosis. BONES AND SOFT TISSUES: Stable visualized osseous structures, including benign sclerotic lesion of left proximal humerus. IMPRESSION: 1. Dense left lung base opacification, mildly increased, which may reflect pneumonia or atelectasis. 2. Small bilateral pleural effusions. 3. Stable cardiomegaly with pulmonary venous hypertension, stable mediastinal contours, left chest AICD, and calcified aortic atherosclerosis.  4. Several additional stable chronic and incidental findings are present, as detailed in the body of the report. Electronically signed by: Ryan Salvage MD 04/10/2024 09:05 AM EST RP Workstation: [id number]       Sigurd Pac, MD  [facility] 04/11/2024  If 7PM-7AM, please contact night-coverage

## 2024-04-11 NOTE — Plan of Care (Signed)
  Problem: Clinical Measurements: Goal: Ability to avoid or minimize complications of infection will improve Outcome: Progressing   Problem: Skin Integrity: Goal: Skin integrity will improve Outcome: Progressing   Problem: Education: Goal: Knowledge of General Education information will improve Description: Including pain rating scale, medication(s)/side effects and non-pharmacologic comfort measures Outcome: Progressing   Problem: Health Behavior/Discharge Planning: Goal: Ability to manage health-related needs will improve Outcome: Progressing   

## 2024-04-11 NOTE — Plan of Care (Signed)
  Problem: Skin Integrity: Goal: Skin integrity will improve Outcome: Progressing   Problem: Activity: Goal: Risk for activity intolerance will decrease Outcome: Progressing   Problem: Nutrition: Goal: Adequate nutrition will be maintained Outcome: Progressing

## 2024-04-11 NOTE — Progress Notes (Signed)
 Palliative Medicine Inpatient Follow Up Note HPI: Patient is a 89 years old female with past medical history of COPD on home oxygen  at 2 L/min, congestive heart failure and chronic left lower extremity wound, history of defibrillator and atrial fibrillation not on anticoagulation presented to hospital after sustaining a fall at home.    Palliative care has been consulted to support additional goals of care conversations in the setting of chronic disease burden.  Today's Discussion 04/11/2024  *Please note that this is a verbal dictation therefore any spelling or grammatical errors are due to the Dragon Medical One system interpretation.  I reviewed the chart notes including nursing notes from [facility], progress notes from Dr. Fairy. I also reviewed vital signs, nursing flowsheets - PO has been generally poor, medication administrations record, labs BMP: Na 135, K 3.7, Chl 91, CO2, 38, Glu 81, BUN 47, Cr 1.72, Ca 8.4, Anion Gap 6, GFR 28, and imaging CXR w/ increased L lung base opacification.    I met with [name] at bedside this morning. She is generally feeling defeated from what she shares with me today. She notes that she is not going to be able to go to skilled nursing as her insurance will not approve this. She remains to feel like she is not ready for hospice. We discussed if she is to get readmitted to the hospital in a relatively short period of time after discharge that perhaps this is telling as to the long term outcomes. She shares understanding.   We discussed OP Palliative support - patient previously working with [facility]. She shares she liked them as they were very patient with her. She is willing to work with them again and this would be her preference.   Patient will discharge to home once medically optimized. She shares that she feels she will be well cared for and if she comes back to the hospital she will have a better idea on where  to move with her care.   Questions and concerns addressed/Palliative Support Provided.   Objective Assessment: Vital Signs Vitals:   04/11/24 0600 04/11/24 0707  BP: 130/66 (!) 130/55  Pulse: 60 (!) 59  Resp: 18 13  Temp:  (!) 97.5 F (36.4 C)  SpO2: 95% 100%    Intake/Output Summary (Last 24 hours) at 04/11/2024 1242 Last data filed at 04/11/2024 9394 Gross per 24 hour  Intake 410 ml  Output 550 ml  Net -140 ml   Last Weight  Most recent update: 04/11/2024  4:30 AM    Weight  54.9 kg (121 lb 0.5 oz)            Gen:  Frail Elderly Caucasian F chronically ill appearing HEENT: moist mucous membranes CV: Regular rate and irregular rhythm  PULM:  On RA, breathing is even and nonlabored ABD: soft/nontender  EXT: BLE edema, venous stasis staning Neuro: Alert and oriented x3   SUMMARY OF RECOMMENDATIONS   DNAR/DNI  Continue current care allowing time for outcomes  Insurance will not authorize SNF placement therefor patient will need to transition home with home health  OP Palliative care on discharge - Previously had [facility]  The PMT is available as needed ______________________________________________________________________________________ Rosaline Becton Brock Palliative Medicine Team Team Cell Phone: [phone] Please utilize secure chat with additional questions, if there is no response within 30 minutes please call the above phone number  Billing based on MDM:  Moderate  Palliative Medicine Team providers  are available by phone from 7am to 7pm daily and can be reached through the team cell phone.  Should this patient require assistance outside of these hours, please call the patient's attending physician.

## 2024-04-12 LAB — TYPE AND SCREEN
ABO/RH(D): A NEG
Antibody Screen: POSITIVE
Unit division: 0
Unit division: 0

## 2024-04-12 LAB — BPAM RBC
Blood Product Expiration Date: 202601012359
Blood Product Expiration Date: 202601012359
ISSUE DATE / TIME: 202512042014
Unit Type and Rh: 600
Unit Type and Rh: 9500

## 2024-04-12 LAB — BASIC METABOLIC PANEL WITH GFR
Anion gap: 10 (ref 5–15)
BUN: 45 mg/dL — ABNORMAL HIGH (ref 8–23)
CO2: 36 mmol/L — ABNORMAL HIGH (ref 22–32)
Calcium: 8.6 mg/dL — ABNORMAL LOW (ref 8.9–10.3)
Chloride: 92 mmol/L — ABNORMAL LOW (ref 98–111)
Creatinine, Ser: 1.54 mg/dL — ABNORMAL HIGH (ref 0.44–1.00)
GFR, Estimated: 32 mL/min — ABNORMAL LOW (ref >60)
Glucose, Bld: 90 mg/dL (ref 70–99)
Potassium: 4.1 mmol/L (ref 3.5–5.1)
Sodium: 138 mmol/L (ref 135–145)

## 2024-04-12 LAB — CBC
HCT: 28.8 % — ABNORMAL LOW (ref 36.0–46.0)
Hemoglobin: 8.5 g/dL — ABNORMAL LOW (ref 12.0–15.0)
MCH: 27.2 pg (ref 26.0–34.0)
MCHC: 29.5 g/dL — ABNORMAL LOW (ref 30.0–36.0)
MCV: 92 fL (ref 80.0–100.0)
Platelets: 234 K/uL (ref 150–400)
RBC: 3.13 MIL/uL — ABNORMAL LOW (ref 3.87–5.11)
RDW: 21.2 % — ABNORMAL HIGH (ref 11.5–15.5)
WBC: 3.7 K/uL — ABNORMAL LOW (ref 4.0–10.5)
nRBC: 0 % (ref 0.0–0.2)

## 2024-04-12 NOTE — Progress Notes (Signed)
 PT Cancellation Note  Patient Details Name: [name] MRN: [id number] DOB: Oct 27, 1933   Cancelled Treatment:    Reason Eval/Treat Not Completed: Pain limiting ability to participate (Pt reporting significant pain and does not want to make it worse. Pt was encouraged to continue to mobilize frequently and do LE exercises. Plan to follow up when able.)  Leontine Hilt DPT Acute Rehab Services [phone] Prefer contact via chat   Leontine KATHEE Hilt 04/12/2024, 3:23 PM

## 2024-04-12 NOTE — Care Management Important Message (Signed)
 Important Message  Patient Details  Name: [name] MRN: [id number] Date of Birth: [birth year]   Important Message Given:  Yes - Medicare IM     [name] 04/12/2024, 10:22 AM

## 2024-04-12 NOTE — Progress Notes (Addendum)
 Palliative Medicine Inpatient Follow Up Note HPI: Patient is a 89 years old female with past medical history of COPD on home oxygen  at 2 L/min, congestive heart failure and chronic left lower extremity wound, history of defibrillator and atrial fibrillation not on anticoagulation presented to hospital after sustaining a fall at home.    Palliative care has been consulted to support additional goals of care conversations in the setting of chronic disease burden.  Today's Discussion 04/12/2024  *Please note that this is a verbal dictation therefore any spelling or grammatical errors are due to the Dragon Medical One system interpretation.  I reviewed the chart notes including nursing notes from [facility], progress notes from Dr. Fairy. I also reviewed vital signs, nursing flowsheets - PO has been generally poor, medication administrations record, labs BMP: Na 138, K 4.1, Chl 92, CO2, 36, Glu 90, BUN 45, Cr 1.54, Ca 8.6, Anion Gap 10, GFR 32, and imaging.    I met with [name] at bedside this late afternoon. We completed a MOST form with one another as below:  Cardiopulmonary Resuscitation: Do Not Attempt Resuscitation (DNR/No CPR)  Medical Interventions: Limited Additional Interventions: Use medical treatment, IV fluids and cardiac monitoring as indicated, DO NOT USE intubation or mechanical ventilation. May consider use of less invasive airway support such as BiPAP or CPAP. Also provide comfort measures. Transfer to the hospital if indicated. Avoid intensive care.   Antibiotics: Antibiotics if indicated  IV Fluids: IV fluids if indicated  Feeding Tube: No feeding tube    We reviewed the plan for discharge  which patient is apprehensive about. We discussed OP PT/OT and Palliative care through [facility].   Allowed time and space for [name] to express her concerns.   Questions and concerns addressed/Palliative Support Provided.   Objective Assessment: Vital  Signs Vitals:   04/12/24 0407 04/12/24 0730  BP: (!) 130/56 (!) 135/53  Pulse: (!) 59 (!) 58  Resp: 19 19  Temp: 97.9 F (36.6 C) 98 F (36.7 C)  SpO2: 100% 94%    Intake/Output Summary (Last 24 hours) at 04/12/2024 1549 Last data filed at 04/12/2024 1024 Gross per 24 hour  Intake 120 ml  Output 1600 ml  Net -1480 ml   Last Weight  Most recent update: 04/12/2024  4:11 AM    Weight  58.3 kg (128 lb 8.5 oz)            Gen:  Frail Elderly Caucasian F chronically ill appearing HEENT: moist mucous membranes CV: Regular rate and irregular rhythm  PULM:  On RA, breathing is even and nonlabored ABD: soft/nontender  EXT: BLE edema, venous stasis staning Neuro: Alert and oriented x3   SUMMARY OF RECOMMENDATIONS   DNAR/DNI  MOST/DNR Form Completed, paper copy placed onto the chart electric copy can be found in Vyn[facility]  Continue current care allowing time for outcomes  OP Palliative care on discharge - Previously had [facility] requested this be resumed on DC  Emotional Support provided  Plan for discharge this evening to home ______________________________________________________________________________________ [name] [facility] Palliative Medicine Team Team Cell Phone: [phone] Please utilize secure chat with additional questions, if there is no response within 30 minutes please call the above phone number  Time: 84  Palliative Medicine Team providers are available by phone from 7am to 7pm daily and can be reached through the team cell phone.  Should this patient require assistance outside of these hours, please call the patient's attending physician.

## 2024-04-12 NOTE — Discharge Summary (Addendum)
 Physician Discharge Summary  [name] FMW:[id number] DOB: October 11, 1933 DOA: 04/02/2024  PCP: [name], MD  Admit date: 04/02/2024 Discharge date: 04/12/2024  Time spent: 45 minutes  Recommendations for Outpatient Follow-up:  Home health PT OT RN aide Outpatient palliative care, poor prognosis, continue discussions regarding hospice Advanced heart failure clinic on 12/19 Left lower leg wound care  Discharge Diagnoses:  Acute on chronic diastolic CHF RV failure Pulmonary hypertension Chronic respiratory failure Pancreatic cancer   Hyperlipidemia   Atrial fibrillation, permanent (HCC)   COPD (chronic obstructive pulmonary disease) (HCC)   CKD stage 3b, GFR 30-44 ml/min (HCC)   ICD (implantable cardioverter-defibrillator) in place   Hypertension   Valvular heart disease   Severe pulmonary hypertension (HCC)   Cellulitis   CHF exacerbation (HCC)   Aortic valve stenosis   Nonrheumatic mitral valve stenosis   Acute on chronic right-sided heart failure [facility])   Discharge Condition: Fair, poor prognosis  Diet recommendation: Heart healthy  Filed Weights   04/10/24 0347 04/11/24 0430 04/12/24 0407  Weight: 55.9 kg 54.9 kg 58.3 kg    History of present illness:  89/F with COPD on home O2, chronic diastolic CHF, RV failure, chronic leg wounds, ICD, paroxysmal A-fib not on anticoagulation, pancreatic CA Hello presenting to the ED with weakness, shortness of breath, dyspnea on exertion, in the ER she was volume overloaded, labs noted hypokalemia, creatinine 1.5, hemoglobin 9.2, BNP 354, chest x-ray noted mild pulmonary edema and diffuse interstitial markings - Admitted started on diuretics and antibiotics for cellulitis  - Cardiology consulted, recommended palliative care - Palliative care meeting completed, patient refuses hospice - Large laceration with open wound edges despite sutures, seen by plastic surgery, recommended wound care  Hospital Course:   Acute on  chronic diastolic CHF exacerbation.   RV Failure History of pulmonary hypertension - Followed by advanced heart failure team as outpatient, not a candidate for pulmonary vasodilators  -Repeat 2D echo this admission noted EF 55-60%, grade 3 DD, moderately reduced RV, severely elevated PASP of 91, moderate aortic stenosis - Cards following, recommended palliative care and signed off , patient declines hospice services at this time, wishes to discharge to rehab when improved - Volume status is improved, weight down 18 LB switch to home regimen of torsemide  100 mg twice daily - Plan was to optimize and then discharge to SNF with palliative care, high risk of quick rehospitalization, rehab declined by insurance for rehab, discussed with patient and daughter regarding hospice again, patient wishes to defer hospice till next time - Plan for discharge home with home health services - Unfortunately anticipate quick rehospitalization given disease burden   Large left lower extremity laceration sp fall chronic lower extremity wounds - Prior to this admission had a fall resulting in a large lower leg laceration, sp sutures placed in the ED 12/28, wound edges not approximated, intermittent bleeding - Requested plastic surgery evaluation> appreciate input, recommended to continue wound care - Completed 6 days of ABX   Hypomagnesemia.  Repleted   Hypokalemia.  Improved.     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  Has had transfusions and IV iron  in the past.  Off anticoagulation at this time.  Recent anemia panel with iron  deficiency - Hemoglobin down to 7.3 earlier, given IV iron  and PRBC, hemoglobin improving   Permanent atrial fibrillation.   Has failed amiodarone  Tikosyn  and flecainide  in the past and had multiple cardioversions.  -Not  a good anticoagulation candidate with recurrent GI bleeds and small bowel AVMs.  Rate is controlled at this time.      Valvular heart disease.  History of tricuspid regurgitation and moderate mitral and aortic stenosis, pulmonary hypertension..  Not a surgical candidate.  Seen by advanced heart failure team as outpatient.    COPD with chronic hypoxic respiratory failure.  On 2-3 L of oxygen  at baseline at home.  Continue nebulizers, incentive spirometer.   Pancreatic neuroendocrine tumor on Sandostatin .  Follows up with oncology Dr Lanny as outpatient. Has had transfusions in the past.,  Dr. Ileana felt she would be appropriate for hospice   Deconditioning, debility, fall frailty, advanced age.  PT has recommended skilled nursing facility placement at this time.  SNF declined by insurance, plan for discharge home with maximal home health services and outpatient palliative care   Goals of care discussion.  Palliative care has been consulted.  At this time patient wishes to continue current level of care refuses hospice at this time, at risk of ongoing decompensation  Discharge Exam: Vitals:   04/12/24 0407 04/12/24 0730  BP: (!) 130/56 (!) 135/53  Pulse: (!) 59 (!) 58  Resp: 19 19  Temp: 97.9 F (36.6 C) 98 F (36.7 C)  SpO2: 100% 94%   General: Chronically ill frail and debilitated female laying in bed AO x 3, no distress today HEENT: No JVD CVS: S1-S2, irregular rhythm Lungs: Poor air movement bilaterally, few coarse rhonchi, decreased at the bases Abdomen: Soft, nontender, bowel sounds present Remedies: Trace edema, chronic skin changes, large laceration left lower leg with sutures and dressing  Discharge Instructions   Discharge Instructions     Diet - low sodium heart healthy   Complete by: As directed    Discharge wound care:   Complete by: As directed    Full-thickness wound left leg clean with Vashe a, allowed to dry, apply Aquacel to wound bed, top with nonadherent pad Teflon gauze, wrap with conforming stretch bandage, change daily   Increase activity slowly   Complete by: As  directed       Allergies as of 04/12/2024       Reactions   Codeine Nausea And Vomiting, Other (See Comments)   Hallucinations, too- CANNOT HAVE; thinks she may have received Narcan  to reverse the effect   Dilaudid  [hydromorphone ] Other (See Comments)   Excessive Somnolence- Required Narcan  X2   Tape Other (See Comments)   CAN TOLERATE ONLY EASY-RELEASE, PAPER TAPE, AS THE SKIN IS VERY THIN AND WILL BRUISE AND TEAR VERY EASILY   Amoxil  [amoxicillin ] Nausea Only   Asa [aspirin ] Other (See Comments)   Told to avoid due to reduced kidney function, has 1 remaining kidney   Ms Contin  [morphine ] Nausea And Vomiting, Other (See Comments)   Hallucinations, also   Neurontin  [gabapentin ] Other (See Comments)   Out of it   Nsaids Other (See Comments)   Told to avoid due to reduced kidney function, has 1 remaining kidney        Medication List     STOP taking these medications    sodium phosphate  Enem       TAKE these medications    acetaminophen  500 MG tablet Commonly known as: TYLENOL  Take 1,000 mg by mouth every 8 (eight) hours.   B-12 PO Take 1 tablet by mouth daily.   CRANBERRY PO Take 1 tablet by mouth daily.   ferrous gluconate  324 MG tablet Commonly known as: FERGON Take 1  tablet (324 mg total) by mouth daily with breakfast.   fluticasone  50 MCG/ACT nasal spray Commonly known as: FLONASE  Place 2 sprays into both nostrils daily as needed for allergies or rhinitis.   HAIR SKIN NAILS PO Take 1 capsule by mouth daily.   leptospermum manuka honey Pste paste Apply 1 Application topically daily.   metolazone  2.5 MG tablet Commonly known as: ZAROXOLYN  Take 1 tablet (2.5 mg total) by mouth 3 (three) times a week. Every Tues., Thurs and Sat. with 40 meq of potassium   polyethylene glycol 17 g packet Commonly known as: MIRALAX  / GLYCOLAX  Take 17 g by mouth daily.   Potassium Chloride  ER 20 MEQ Tbcr Take 2 tablets (40 mEq total) by mouth daily. Take extra 2  tabs with metolazone    rosuvastatin  10 MG tablet Commonly known as: CRESTOR  Take 1 tablet (10 mg total) by mouth daily.   torsemide  20 MG tablet Commonly known as: DEMADEX  Take 5 tablets (100 mg total) by mouth 2 (two) times daily.   traMADol  50 MG tablet Commonly known as: ULTRAM  Take 1 tablet (50 mg total) by mouth 3 (three) times daily as needed for moderate pain (pain score 4-6) (sedation precautions).   VITAMIN C  PO Take 1 tablet by mouth daily.   VITAMIN D -3 PO Take 2 capsules by mouth daily.               Discharge Care Instructions  (From admission, onward)           Start     Ordered   04/12/24 0000  Discharge wound care:       Comments: Full-thickness wound left leg clean with Vashe a, allowed to dry, apply Aquacel to wound bed, top with nonadherent pad Teflon gauze, wrap with conforming stretch bandage, change daily   04/12/24 0957           Allergies  Allergen Reactions   Codeine Nausea And Vomiting and Other (See Comments)    Hallucinations, too- CANNOT HAVE; thinks she may have received Narcan  to reverse the effect   Dilaudid  [Hydromorphone ] Other (See Comments)    Excessive Somnolence- Required Narcan  X2   Tape Other (See Comments)    CAN TOLERATE ONLY EASY-RELEASE, PAPER TAPE, AS THE SKIN IS VERY THIN AND WILL BRUISE AND TEAR VERY EASILY   Amoxil  [Amoxicillin ] Nausea Only   Asa [Aspirin ] Other (See Comments)    Told to avoid due to reduced kidney function, has 1 remaining kidney   Ms Contin  [Morphine ] Nausea And Vomiting and Other (See Comments)    Hallucinations, also   Neurontin  [Gabapentin ] Other (See Comments)    Out of it   Nsaids Other (See Comments)    Told to avoid due to reduced kidney function, has 1 remaining kidney    Follow-up Information     [name], MD Follow up.   Specialty: Family Medicine Contact information: [address] [place] [address] [phone]                  The  results of significant diagnostics from this hospitalization (including imaging, microbiology, ancillary and laboratory) are listed below for reference.    Significant Diagnostic Studies: DG CHEST PORT 1 VIEW Result Date: 04/10/2024 EXAM: 1 VIEW(S) XRAY OF THE CHEST 04/10/2024 08:34:00 AM COMPARISON: 04/08/2024 CLINICAL HISTORY: Dyspnea FINDINGS: LINES, TUBES AND DEVICES: Stable left chest AICD. LUNGS AND PLEURA: Small bilateral pleural effusions. Dense left lung base opacification, mildly increased, possibly from pneumonia or atelectasis.  Stable findings of pulmonary venous hypertension. No pneumothorax. HEART AND MEDIASTINUM: Stable cardiomegaly and mediastinal contours. Calcified aortic atherosclerosis. BONES AND SOFT TISSUES: Stable visualized osseous structures, including benign sclerotic lesion of left proximal humerus. IMPRESSION: 1. Dense left lung base opacification, mildly increased, which may reflect pneumonia or atelectasis. 2. Small bilateral pleural effusions. 3. Stable cardiomegaly with pulmonary venous hypertension, stable mediastinal contours, left chest AICD, and calcified aortic atherosclerosis. 4. Several additional stable chronic and incidental findings are present, as detailed in the body of the report. Electronically signed by: Ryan Salvage MD 04/10/2024 09:05 AM EST RP Workstation: [id number]   DG Chest Port 1 View Result Date: 04/09/2024 EXAM: 1 VIEW(S) XRAY OF THE CHEST 04/09/2024 12:07:55 AM COMPARISON: 04/02/2024 CLINICAL HISTORY: SOB (shortness of breath) FINDINGS: LUNGS AND PLEURA: Small bilateral pleural effusions. Chronic coarsened interstitial markings. Left basilar opacity. No pneumothorax. HEART AND MEDIASTINUM: Cardiomegaly. Atherosclerotic calcifications. Left chest AICD/pacemaker in place. BONES AND SOFT TISSUES: Similar sclerotic lesion of the left proximal humerus. IMPRESSION: 1. Chronic coarsened interstitial markings consistent with edema. Small bilateral pleural  effusions. 2. Left basilar opacity, which may reflect atelectasis or superimposed infection. Electronically signed by: Norman Gatlin MD 04/09/2024 12:14 AM EST RP Workstation: [id number]   ECHOCARDIOGRAM COMPLETE Result Date: 04/04/2024    ECHOCARDIOGRAM REPORT   Patient Name:   [name] Date of Exam: 04/04/2024 Medical Rec #:  [id number]         Height:       66.0 in Accession #:    [id number]        Weight:       138.2 lb Date of Birth:  [birth year]         BSA:          1.709 m Patient Age:    89 years          BP:           120/48 mmHg Patient Gender: F                 HR:           70 bpm. Exam Location:  Inpatient Procedure: 2D Echo, Cardiac Doppler and Color Doppler (Both Spectral and Color            Flow Doppler were utilized during procedure). Indications:    I50.40* Unspecified combined systolic (congestive) and diastolic                 (congestive) heart failure  History:        Patient has prior history of Echocardiogram examinations, most                 recent 05/28/2023. CHF and Cardiomyopathy, Defibrillator and                 Abnormal ECG, COPD, Mitral Valve Disease and Aortic Valve                 Disease, Arrythmias:Bradycardia, Signs/Symptoms:Bacteremia; Risk                 Factors:Hypertension.  Sonographer:    Ellouise Mose RDCS Referring Phys: [id number] [facility]OK[facility]  Sonographer Comments: Technically difficult study due to poor echo windows. Very difficult angle due to patient position on right of bed and could not turn due to leg injury. IMPRESSIONS  1. Left ventricular ejection fraction, by estimation, is 55 to 60%. The left ventricle has normal function. The left ventricle has no regional wall motion abnormalities.  Left ventricular diastolic parameters are consistent with Grade III diastolic dysfunction (restrictive). Elevated left atrial pressure. There is the interventricular septum is flattened in systole and diastole, consistent with right ventricular pressure and volume  overload.  2. Right ventricular systolic function is moderately reduced. The right ventricular size is normal. There is severely elevated pulmonary artery systolic pressure. The estimated right ventricular systolic pressure is 91.5 mmHg.  3. Right atrial size was moderately dilated.  4. The mitral valve is degenerative. Mild to moderate mitral valve regurgitation. Moderate mitral stenosis. The mean mitral valve gradient is 7.0 mmHg. Moderate mitral annular calcification.  5. Tricuspid valve regurgitation is severe.  6. The aortic valve is calcified. There is severe calcifcation of the aortic valve. There is severe thickening of the aortic valve. Aortic valve regurgitation is mild. Moderate aortic valve stenosis. Aortic valve area, by VTI measures 1.18 cm. Aortic valve mean gradient measures 20.5 mmHg. Aortic valve Vmax measures 3.00 m/s.  7. The inferior vena cava is dilated in size with >50% respiratory variability, suggesting right atrial pressure of 8 mmHg.  8. A small pericardial effusion is present. The pericardial effusion is circumferential. Moderate pleural effusion in the left lateral region. FINDINGS  Left Ventricle: Left ventricular ejection fraction, by estimation, is 55 to 60%. The left ventricle has normal function. The left ventricle has no regional wall motion abnormalities. The left ventricular internal cavity size was normal in size. There is  no left ventricular hypertrophy. The interventricular septum is flattened in systole and diastole, consistent with right ventricular pressure and volume overload. Left ventricular diastolic parameters are consistent with Grade III diastolic dysfunction (restrictive). Elevated left atrial pressure. Right Ventricle: The right ventricular size is normal. No increase in right ventricular wall thickness. Right ventricular systolic function is moderately reduced. There is severely elevated pulmonary artery systolic pressure. The tricuspid regurgitant velocity is  4.57 m/s, and with an assumed right atrial pressure of 8 mmHg, the estimated right ventricular systolic pressure is 91.5 mmHg. Left Atrium: Left atrial size was normal in size. Right Atrium: Right atrial size was moderately dilated. Pericardium: A small pericardial effusion is present. The pericardial effusion is circumferential. Mitral Valve: The mitral valve is degenerative in appearance. There is moderate thickening of the mitral valve leaflet(s). Moderate mitral annular calcification. Mild to moderate mitral valve regurgitation. Moderate mitral valve stenosis. MV peak gradient, 22.5 mmHg. The mean mitral valve gradient is 7.0 mmHg. Tricuspid Valve: The tricuspid valve is normal in structure. Tricuspid valve regurgitation is severe. No evidence of tricuspid stenosis. Aortic Valve: The aortic valve is calcified. There is severe calcifcation of the aortic valve. There is severe thickening of the aortic valve. Aortic valve regurgitation is mild. Moderate aortic stenosis is present. Aortic valve mean gradient measures 20.5 mmHg. Aortic valve peak gradient measures 36.1 mmHg. Aortic valve area, by VTI measures 1.18 cm. Pulmonic Valve: The pulmonic valve was normal in structure. Pulmonic valve regurgitation is mild to moderate. No evidence of pulmonic stenosis. Aorta: The aortic root and ascending aorta are structurally normal, with no evidence of dilitation. Venous: The inferior vena cava is dilated in size with greater than 50% respiratory variability, suggesting right atrial pressure of 8 mmHg. IAS/Shunts: No atrial level shunt detected by color flow Doppler. Additional Comments: A device lead is visualized. There is a moderate pleural effusion in the left lateral region.  LEFT VENTRICLE PLAX 2D LVIDd:         4.30 cm     Diastology LVIDs:  2.80 cm     LV e' medial:    9.14 cm/s LV PW:         1.00 cm     LV E/e' medial:  23.4 LV IVS:        1.10 cm     LV e' lateral:   9.68 cm/s LVOT diam:     2.10 cm      LV E/e' lateral: 22.1 LV SV:         82 LV SV Index:   48 LVOT Area:     3.46 cm  LV Volumes (MOD) LV vol d, MOD A2C: 90.5 ml LV vol d, MOD A4C: 22.7 ml LV vol s, MOD A2C: 17.4 ml LV vol s, MOD A4C: 19.2 ml LV SV MOD A2C:     73.1 ml LV SV MOD A4C:     22.7 ml LV SV MOD BP:      18.1 ml RIGHT VENTRICLE            IVC RV S prime:     9.79 cm/s  IVC diam: 2.60 cm TAPSE (M-mode): 1.1 cm                            PULMONARY VEINS                            Diastolic Velocity: 53.50 cm/s                            S/D Velocity:       1.30                            Systolic Velocity:  69.60 cm/s LEFT ATRIUM             Index        RIGHT ATRIUM           Index LA diam:        4.30 cm 2.52 cm/m   RA Area:     20.60 cm LA Vol (A2C):   46.2 ml 27.03 ml/m  RA Volume:   55.90 ml  32.70 ml/m LA Vol (A4C):   33.4 ml 19.54 ml/m LA Biplane Vol: 41.9 ml 24.51 ml/m  AORTIC VALVE AV Area (Vmax):    1.24 cm AV Area (Vmean):   1.18 cm AV Area (VTI):     1.18 cm AV Vmax:           300.50 cm/s AV Vmean:          213.500 cm/s AV VTI:            0.698 m AV Peak Grad:      36.1 mmHg AV Mean Grad:      20.5 mmHg LVOT Vmax:         108.00 cm/s LVOT Vmean:        72.600 cm/s LVOT VTI:          0.237 m LVOT/AV VTI ratio: 0.34  AORTA Ao Root diam: 2.80 cm MITRAL VALVE                TRICUSPID VALVE MV Area (PHT): 4.60 cm     TR Peak grad:   83.5 mmHg MV Area VTI:   1.56 cm     TR Vmax:  457.00 cm/s MV Peak grad:  22.5 mmHg MV Mean grad:  7.0 mmHg     SHUNTS MV Vmax:       2.37 m/s     Systemic VTI:  0.24 m MV Vmean:      116.0 cm/s   Systemic Diam: 2.10 cm MV Decel Time: 165 msec MV E velocity: 214.00 cm/s MV A velocity: 98.50 cm/s MV E/A ratio:  2.17 Wilbert Bihari MD Electronically signed by Wilbert Bihari MD Signature Date/Time: 04/04/2024/11:08:14 AM    Final    DG Chest Portable 1 View Result Date: 04/02/2024 EXAM: 1 VIEW(S) XRAY OF THE CHEST 04/02/2024 02:47:00 PM COMPARISON: 09/03/2023 CLINICAL HISTORY: sob FINDINGS:  LINES, TUBES AND DEVICES: AICD leads are unchanged and appear in appropriate position. LUNGS AND PLEURA: Mild pulmonary edema with diffuse accentuated interstitial markings. Blunting of the costophrenic angles favoring pleural effusions, right greater than left. No pneumothorax. HEART AND MEDIASTINUM: Cardiomegaly. Calcified aorta. AICD leads are unchanged and appear in appropriate position. BONES AND SOFT TISSUES: Stable benign appearing sclerotic lesion proximal left humerus. No acute osseous abnormality. IMPRESSION: 1. Mild pulmonary edema with diffuse accentuated interstitial markings. 2. Blunting of the costophrenic angles favoring pleural effusions, right greater than left. 3. Cardiomegaly with unchanged AICD leads. Electronically signed by: Ryan Salvage MD 04/02/2024 03:23 PM EST RP Workstation: [id number]   DG Tibia/Fibula Left Port Result Date: 04/02/2024 CLINICAL DATA:  Fall EXAM: PORTABLE LEFT TIBIA AND FIBULA - 2 VIEW COMPARISON:  None Available. FINDINGS: There is soft tissue laceration or ulceration of the anterior lower extremity. There is no underlying periosteal reaction, fracture or dislocation. The bones are diffusely osteopenic. There is diffuse soft tissue swelling of the lower extremity. IMPRESSION: Soft tissue laceration or ulceration of the anterior lower extremity. No underlying periosteal reaction, fracture or dislocation. Electronically Signed   By: Greig Pique M.D.   On: 04/02/2024 15:16    Microbiology: Recent Results (from the past 240 hours)  Resp panel by RT-PCR (RSV, Flu A&B, Covid) Anterior Nasal Swab     Status: None   Collection Time: 04/02/24  1:57 PM   Specimen: Anterior Nasal Swab  Result Value Ref Range Status   SARS Coronavirus 2 by RT PCR NEGATIVE NEGATIVE Final   Influenza A by PCR NEGATIVE NEGATIVE Final   Influenza B by PCR NEGATIVE NEGATIVE Final    Comment: (NOTE) The Xpert Xpress SARS-CoV-2/FLU/RSV plus assay is intended as an aid in the  diagnosis of influenza from Nasopharyngeal swab specimens and should not be used as a sole basis for treatment. Nasal washings and aspirates are unacceptable for Xpert Xpress SARS-CoV-2/FLU/RSV testing.  Fact Sheet for Patients: bloggercourse.com  Fact Sheet for Healthcare Providers: seriousbroker.it  This test is not yet approved or cleared by the [place]  FDA and has been authorized for detection and/or diagnosis of SARS-CoV-2 by FDA under an Emergency Use Authorization (EUA). This EUA will remain in effect (meaning this test can be used) for the duration of the COVID-19 declaration under Section 564(b)(1) of the Act, 21 U.S.C. section 360bbb-3(b)(1), unless the authorization is terminated or revoked.     Resp Syncytial Virus by PCR NEGATIVE NEGATIVE Final    Comment: (NOTE) Fact Sheet for Patients: bloggercourse.com  Fact Sheet for Healthcare Providers: seriousbroker.it  This test is not yet approved or cleared by the [place]  FDA and has been authorized for detection and/or diagnosis of SARS-CoV-2 by FDA under an Emergency Use Authorization (EUA). This EUA will remain in effect (meaning this  test can be used) for the duration of the COVID-19 declaration under Section 564(b)(1) of the Act, 21 U.S.C. section 360bbb-3(b)(1), unless the authorization is terminated or revoked.  Performed at [facility], [address], [place], [place] [address]   Blood culture (routine x 2)     Status: None   Collection Time: 04/02/24  1:58 PM   Specimen: BLOOD  Result Value Ref Range Status   Specimen Description BLOOD RIGHT ANTECUBITAL  Final   Special Requests   Final    BOTTLES DRAWN AEROBIC AND ANAEROBIC Blood Culture results may not be optimal due to an inadequate volume of blood received in culture bottles   Culture   Final    NO GROWTH 5 DAYS Performed at [facility], [address], [place], [place] [address]    Report Status 04/07/2024 FINAL  Final  Blood culture (routine x 2)     Status: None   Collection Time: 04/02/24  2:03 PM   Specimen: BLOOD  Result Value Ref Range Status   Specimen Description BLOOD RIGHT ANTECUBITAL  Final   Special Requests   Final    BOTTLES DRAWN AEROBIC AND ANAEROBIC Blood Culture results may not be optimal due to an inadequate volume of blood received in culture bottles   Culture   Final    NO GROWTH 5 DAYS Performed at [facility], [address], [place], [place] [address]    Report Status 04/07/2024 FINAL  Final     Labs: Basic Metabolic Panel: Recent Labs  Lab 04/06/24 0339 04/07/24 0259 04/08/24 0234 04/09/24 0243 04/10/24 0223 04/11/24 0252 04/12/24 0207  NA 137 137 135 132* 135 135 138  K 5.0 4.8 4.2 5.0 3.7 3.7 4.1  CL 96* 92* 93* 87* 92* 91* 92*  CO2 31 33* 33* 31 32 38* 36*  GLUCOSE 113* 106* 99 132* 113* 81 90  BUN 42* 45* 44* 42* 51* 47* 45*  CREATININE 1.42* 1.39* 1.51* 1.48* 1.87* 1.72* 1.54*  CALCIUM  8.9 8.8* 8.4* 8.6* 8.6* 8.4* 8.6*  MG 2.4 2.1 2.1  --   --   --   --   PHOS 3.2 3.1  --   --   --   --   --    Liver Function Tests: No results for input(s): AST, ALT, ALKPHOS, BILITOT, PROT, ALBUMIN  in the last 168 hours. No results for input(s): LIPASE, AMYLASE in the last 168 hours. No results for input(s): AMMONIA in the last 168 hours. CBC: Recent Labs  Lab 04/07/24 0259 04/08/24 0234 04/09/24 0243 04/11/24 0252 04/12/24 0207  WBC 4.5 4.7 11.0* 4.1 3.7*  NEUTROABS  --   --  9.8*  --   --   HGB 7.3* 7.3* 8.9* 8.2* 8.5*  HCT 24.3* 24.6* 28.7* 27.3* 28.8*  MCV 89.0 89.5 88.6 91.0 92.0  PLT 217 259 278 232 234   Cardiac Enzymes: No results for input(s): CKTOTAL, CKMB, CKMBINDEX, TROPONINI in the last 168 hours. BNP: BNP (last 3 results) Recent Labs    06/24/23 1621 07/10/23 1641 04/02/24 1537  BNP 516.7* 322.2* 354.1*     ProBNP (last 3 results) No results for input(s): PROBNP in the last 8760 hours.  CBG: No results for input(s): GLUCAP in the last 168 hours.     Signed:  Sigurd Pac MD.  [facility] 04/12/2024, 9:58 AM

## 2024-04-12 NOTE — TOC Transition Note (Addendum)
 Transition of Care [facility]) - Discharge Note   Patient Details  Name: [name] MRN: [id number] Date of Birth: 1933-06-17  [facility] CM/SW Contact:  Waddell Barnie Rama, RN Phone Number: 04/12/2024, 10:06 AM   Clinical Narrative:    NCM offered choice for HHRN, HHPT, HHOT, she chose [facility].  Referral sent thru portal to [facility]. They have accepted.  Soc will begin 24 to 65 he's post dc.   NCM offered choice for OP Palliative, she chose [facility].  NCM made referral to Fran.  She accepted referral.  Patient's grand daughter will transport her home, she is bringing her some clothes and her oxygen . Daughter would like bsc and w/chair for patient,  she is ok with [employer].  Referral sent to [employer] thru portal.     Barriers to Discharge: Continued Medical Work up   Patient Goals and CMS Choice Patient states their goals for this hospitalization and ongoing recovery are:: SNF          Discharge Placement                       Discharge Plan and Services Additional resources added to the After Visit Summary for   In-house Referral: Clinical Social Work   Post Acute Care Choice: Skilled Nursing Facility                               Social Drivers of Health (SDOH) Interventions SDOH Screenings   Food Insecurity: No Food Insecurity (04/02/2024)  Housing: Low Risk  (04/02/2024)  Transportation Needs: No Transportation Needs (04/02/2024)  Utilities: Not At Risk (04/02/2024)  Alcohol Screen: Low Risk  (11/06/2023)  Depression (PHQ2-9): Low Risk  (03/01/2024)  Financial Resource Strain: Low Risk  (11/06/2023)  Physical Activity: Insufficiently Active (11/06/2023)  Social Connections: Socially Isolated (04/02/2024)  Stress: No Stress Concern Present (11/06/2023)  Tobacco Use: Medium Risk (04/02/2024)  Health Literacy: Adequate Health Literacy (11/06/2023)     Readmission Risk Interventions    06/17/2023   10:07 AM 01/31/2023    3:53  PM 11/09/2022   12:00 PM  Readmission Risk Prevention Plan  Transportation Screening Complete Complete Complete  PCP or Specialist Appt within 3-5 Days   Complete  HRI or Home Care Consult  Complete Complete  Social Work Consult for Recovery Care Planning/Counseling   Complete  Palliative Care Screening  Not Applicable   Medication Review [employer])  Complete Complete  HRI or Home Care Consult Complete    SW Recovery Care/Counseling Consult Complete    Palliative Care Screening Not Applicable    Skilled Nursing Facility Not Applicable

## 2024-04-13 ENCOUNTER — Telehealth: Payer: Self-pay | Admitting: *Deleted

## 2024-04-13 NOTE — Transitions of Care (Post Inpatient/ED Visit) (Signed)
   04/13/2024  Name: [name] MRN: [id number] DOB: Dec 20, 1933  Today's TOC FU Call Status: Today's TOC FU Call Status:: Unsuccessful Call (1st Attempt) Unsuccessful Call (1st Attempt) Date: 04/13/24  Attempted to reach the patient regarding the most recent Inpatient/ED visit.  Follow Up Plan: Additional outreach attempts will be made to reach the patient to complete the Transitions of Care (Post Inpatient/ED visit) call.   Cathlean Headland BSN RN  [facility] [place] Health Care Management Coordinator [email or web address] Direct Dial: [phone]  Fax: [phone] Website: [email or web address]

## 2024-04-14 ENCOUNTER — Telehealth: Payer: Self-pay | Admitting: Hematology

## 2024-04-14 ENCOUNTER — Telehealth: Payer: Self-pay

## 2024-04-14 ENCOUNTER — Other Ambulatory Visit: Payer: Self-pay

## 2024-04-14 NOTE — Transitions of Care (Post Inpatient/ED Visit) (Signed)
° °  04/14/2024  Name: [name] MRN: [id number] DOB: Sep 11, 1933  Today's TOC FU Call Status: Today's TOC FU Call Status:: Unsuccessful Call (2nd Attempt) Unsuccessful Call (2nd Attempt) Date: 04/14/24  Attempted to reach the patient regarding the most recent Inpatient/ED visit.  Follow Up Plan: Additional outreach attempts will be made to reach the patient to complete the Transitions of Care (Post Inpatient/ED visit) call.   Arvin Seip RN, BSN, CCM [employer], Population Health Case Manager Phone: [phone]

## 2024-04-15 LAB — TRANSFUSION REACTION
DAT C3: NEGATIVE
Post RXN DAT IgG: NEGATIVE

## 2024-04-16 ENCOUNTER — Telehealth: Payer: Self-pay

## 2024-04-16 ENCOUNTER — Other Ambulatory Visit: Payer: Self-pay | Admitting: Family Medicine

## 2024-04-16 NOTE — Transitions of Care (Post Inpatient/ED Visit) (Signed)
° °  04/16/2024  Name: [name] MRN: [id number] DOB: 1933-06-23  Today's TOC FU Call Status: Today's TOC FU Call Status:: Unsuccessful Call (3rd Attempt) Unsuccessful Call (3rd Attempt) Date: 04/16/24  Attempted to reach the patient regarding the most recent Inpatient/ED visit.  Follow Up Plan: No further outreach attempts will be made at this time. We have been unable to contact the patient.  Arvin Seip RN, BSN, CCM [employer], Population Health Case Manager Phone: [phone]

## 2024-04-17 NOTE — Telephone Encounter (Signed)
 ERx

## 2024-04-19 ENCOUNTER — Encounter: Admitting: Internal Medicine

## 2024-04-19 NOTE — Assessment & Plan Note (Signed)
 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   -Outpatient EUS 03/04/2022 by Dr. 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 -She began first line sandostatin  injections q28 days on 03/27/22.  -Dotatate PET scan, done 2 weeks after first injection 04/10/22, shows no significant tracer avidity in the pancrease or distant sites. Findings are likely due to close timing relative to the sandostatin  injection blocking receptors. 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   -She is currently receiving Sandostatin  injections every 4 weeks.  Retacrit  injections being given every 2 weeks to keep blood counts elevated.

## 2024-04-19 NOTE — Progress Notes (Unsigned)
 Patient Care Team: [name], MD as PCP - General (Family Medicine) [name], MD as PCP - Advanced Heart Failure (Cardiology) [name], MD as PCP - Cardiology (Cardiology) [name], MD (Inactive) as PCP - Electrophysiology (Cardiology) [name], MD as Consulting Physician (Cardiology) [name], MD (Inactive) as Consulting Physician (Cardiology) [name], [name], MD as Consulting Physician (Vascular Surgery) [name], MD as Consulting Physician (Hematology and Oncology) [name], MD as Consulting Physician (Oncology) [name], [facility] (Inactive) as Pharmacist (Pharmacist)  Clinic Day:  04/21/2024  Referring physician: [name], MD  ASSESSMENT & PLAN:   Assessment & Plan: 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   -Outpatient EUS 03/04/2022 by Dr. 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 -She began first line sandostatin  injections q28 days on 03/27/22.  -Dotatate PET scan, done 2 weeks after first injection 04/10/22, shows no significant tracer avidity in the pancrease or distant sites. Findings are likely due to close timing relative to the sandostatin  injection blocking receptors. When we repeat this for restaging next year, will arrange 4 weeks after sandostatin  -She has persistent anemia, likely secondary to GI bleeding from the tumor and AVM,  she has had multiple hospital admission for anemia and bleeding.   -She is currently receiving Sandostatin  injections every 4 weeks.  Retacrit  injections being given every 2 weeks to keep blood counts elevated.  Normocytic anemia Patient currently receiving Aranesp injections every 2 weeks.  Blood counts improving.  Has not needed blood transfusion since 06/23/2023. -07/28/2023 -Hgb 8.2.  Blood transfusion not needed this week.  Recheck labs in 1 week.  Add ferritin. -Continue Aranesp injections every 2 weeks.  Blood transfusions as needed.  2 units PRBCs for hemoglobin <7.0.  1 unit PRBCs for Hgb between 7 and 8. - 02/02/2024 -Hgb 9.0 today.  No requirement for blood transfusion.  Administer Retacrit  today.  Arrange for IV iron  as indicated based on results of ferritin when available. -04/21/2024 -stable and improved anemia.  Last received IV iron  and blood transfusion while in the hospital, 04/08/2024 and 04/09/2024 respectively.  Awaiting results of ferritin.  No requirement for blood transfusion or iron  infusion today.  Continue to check every 2 weeks.  Retacrit  to be given today and continued every 2 weeks.    Large left lower extremity laceration sp fall chronic lower extremity wounds - Prior to most recent hospital admission, she had a fall resulting in a large lower leg laceration, sp sutures placed in the ED 11/28, wound edges not approximated, intermittent bleeding - Requested plastic surgery evaluation> appreciate input, recommended to continue wound care - Completed 6 days of ABX    Left lower extremity wound  Occurred after fall at home.  Was hospitalized from 04/02/2024 through 04/12/2024 due to fall.  She also had acute on diastolic CHF.  She is going to follow-up with wound care clinic.  Is supposed to be having a home health  nurse for wound care in the meantime.  Initial appointment was set up for Monday, 04/19/2024.  The patient rescheduled this appointment.  Will now be after  Christmas.  She is doing wound care and dressing changes at home as per hospitalization education.  The left lower extremity is currently covered with clean and dry dressing.  There is evidence of healing wound to the lateral aspect of left foot which is scabbed over.  No evidence of infection or drainage noted today.  Discussion about importance of proper wound care follow-up with the patient and her daughter.  Patient states she will follow-up with wound care as currently scheduled.  She is also to have home health PT and OT.  These services will start just after Christmas.  Anemia due to chronic kidney disease Slightly improved anemia with Hgb 9.4 and HCT 32.0. She received IV Venofer  on 04/08/2024. Last blood transfusion given on 04/09/2024. Improved counts today show no need for blood transfusion. Awaiting ferritin results. Will proceed with Retacrit  injection as scheduled.   Primary pancreatic neuroendocrine tumor. Continues to receive Sandostatin  injections every 4 weeks. Tolerating well.   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    Plan Labs reviewed.  -improved anemia. Awaiting results of ferritin.   -No requirement for blood transfusion or iron  infusion today. Proceed to Retacrit  injection. Sandostatin  injection to be given today due to primary pancreatic neuroendocrine tumor. Hold bevacizumab  due to left lower leg wound with delayed healing. - Reviewed importance of allowing wound care nurse to come and evaluate wound and proceed with dressing changes. Start with home PT and OT previously started from hospitalization. Check labs in 2 weeks with Retacrit  injection. Labs, follow-up, and Retacrit  and Sandostatin  injections in 4 weeks. Reassess appropriateness of bevacizumab  due to bleeding AVMs, likely causing  chronic anemia.  The patient understands the plans discussed today and is in agreement with them.  She knows to contact our office if she develops concerns prior to her next appointment.  I provided 25 minutes of face-to-face time during this encounter and > 50% was spent counseling as documented under my assessment and plan.    Powell FORBES Lessen, NP  [facility] - A DEPT OF [facility] [address] [place] [address] Dept: [phone] Dept Fax: [phone]   No orders of the defined types were placed in this encounter.     CHIEF COMPLAINT:  CC: Primary pancreatic neuroendocrine tumor and anemia of chronic blood loss  Current Treatment: Bevacizumab  every 4 weeks; Retacrit  every 2 weeks; Sandostatin  injection every 4 weeks  INTERVAL HISTORY:  Theona is here today for repeat clinical assessment.  She was last seen by Dr. Lanny on 03/01/2024.  She was recently hospitalized from 04/02/2024 through 04/12/2024.  Hospitalization was due to acute on chronic diastolic congestive heart failure.  She had a fall at home which resulted in large left lower extremity wound.  She had extensive wound care done in the hospital.  She also had 6 days of antibiotics.  She was to have initial visit with home wound care nurse on 04/19/2024.  She was scheduled for lap chole.  Next visit was not able to be scheduled until after Christmas.  She was also discharged with orders for home OT and PT.  She states this is already set up and she will start shortly after Christmas.  She was discharged with outpatient palliative care.  Not currently admitted to hospice.  She reports feeling very weak.  She is easily fatigued.  Shortness of breath is at baseline.  She is back on nasal cannula oxygen  of 2 L/min.  Has been increased to up to 6 L/min while in the hospital.  She states that gradually, energy and strength is improving.  She is getting up to walk  around and do more for herself over the past few days.  She denies fevers or chills. She denies pain. Her appetite is good. Her weight has been stable.  Back to baseline weight of 114 pounds after significant weight gain from CHF.  I have reviewed the past medical history, past surgical history, social history and family history with the patient and they are unchanged from previous note.  ALLERGIES:  is allergic to codeine, dilaudid  [hydromorphone ], tape, amoxil  [amoxicillin ], asa [aspirin ], ms contin  [morphine ], neurontin  [gabapentin ], and nsaids.  MEDICATIONS:  Current Outpatient Medications  Medication Sig Dispense Refill   acetaminophen  (TYLENOL ) 500 MG tablet Take 1,000 mg by mouth every 8 (eight) hours.     Ascorbic Acid  (VITAMIN C  PO) Take 1 tablet by mouth daily.     Cholecalciferol  (VITAMIN D -3 PO) Take 2 capsules by mouth daily.     CRANBERRY PO Take 1 tablet by mouth daily.     Cyanocobalamin  (B-12 PO) Take 1 tablet by mouth daily.     ferrous gluconate  (FERGON) 324 MG tablet Take 1 tablet (324 mg total) by mouth daily with breakfast. 30 tablet 3   fluticasone  (FLONASE ) 50 MCG/ACT nasal spray Place 2 sprays into both nostrils daily as needed for allergies or rhinitis. 16 g 0   leptospermum manuka honey (MEDIHONEY) PSTE paste Apply 1 Application topically daily.     metolazone  (ZAROXOLYN ) 2.5 MG tablet Take 1 tablet (2.5 mg total) by mouth 3 (three) times a week. Every Tues., Thurs and Sat. with 40 meq of potassium 15 tablet 3   Multiple Vitamins-Minerals (HAIR SKIN NAILS PO) Take 1 capsule by mouth daily.     polyethylene glycol (MIRALAX  / GLYCOLAX ) 17 g packet Take 17 g by mouth daily.     Potassium Chloride  ER 20 MEQ TBCR Take 2 tablets (40 mEq total) by mouth daily. Take extra 2 tabs with metolazone  258 tablet 2   rosuvastatin  (CRESTOR ) 10 MG tablet Take 1 tablet (10 mg total) by mouth daily. 30 tablet 0   torsemide  (DEMADEX ) 20 MG tablet Take 5 tablets (100 mg total) by mouth 2  (two) times daily. 300 tablet 3   traMADol  (ULTRAM ) 50 MG tablet Take 1 tablet (50 mg total) by mouth 3 (three) times daily as needed for moderate pain (pain score 4-6). 70 tablet 0   No current facility-administered medications for this visit.      REVIEW OF SYSTEMS:   Constitutional: Denies fevers, chills or abnormal weight loss.  Fatigue and increased weakness Eyes: Denies blurriness of vision Ears, nose, mouth, throat, and face: Denies mucositis or sore throat Respiratory: Denies cough.  She has baseline shortness of breath.  Uses oxygen  at 2 L/min. Cardiovascular: Denies palpitation.  She does have bilateral lower extremity edema. Gastrointestinal:  Denies nausea, heartburn or change in bowel habits Skin: She reports very slow healing of left lower extremity wound.  States she is doing dressing changes herself at home.  Doing things exactly as instructed while  she was in the hospital.  Wound care nurse to visit with her after presents.  Chronic infection of the right lower extremity appears to be healing. Lymphatics: Denies new lymphadenopathy or easy bruising Neurological:Denies numbness, tingling or new weaknesses Behavioral/Psych: Mood is stable, no new changes.  Mild depression noted. All other systems were reviewed with the patient and are negative.   VITALS:   Today's Vitals   04/21/24 1317 04/21/24 1337  BP: 130/60   Pulse: 70   Resp: 17   Temp: (!) 97.3 F (36.3 C)   SpO2: 97%   Weight: 114 lb (51.7 kg)   PainSc:  7    Body mass index is 18.4 kg/m.    Wt Readings from Last 3 Encounters:  04/21/24 114 lb (51.7 kg)  04/12/24 128 lb 8.5 oz (58.3 kg)  03/10/24 114 lb (51.7 kg)    Body mass index is 18.4 kg/m.  Performance status (ECOG): 2 - Symptomatic, <50% confined to bed  PHYSICAL EXAM:   GENERAL:alert, no distress and comfortable SKIN: skin color, texture, turgor are normal, no rashes or significant lesions.  Left lower extremity covered with large  clean and dry bandage.  Right lower extremity with very dry, but intact skin.  No open lesions or ulcers are noted at this time.  No drainage present. EYES: normal, Conjunctiva are pink and non-injected, sclera clear OROPHARYNX:no exudate, no erythema and lips, buccal mucosa, and tongue normal  NECK: supple, thyroid  normal size, non-tender, without nodularity LYMPH:  no palpable lymphadenopathy in the cervical, axillary or inguinal LUNGS: Breath sounds are clear but diminished bilaterally.  She is on oxygen  at 2 L/min via nasal cannula. HEART: She has moderate soft, blowing systolic murmur.  Heart rate and rhythm are currently regular.  Mild bilateral lower extremity edema. ABDOMEN:abdomen soft, non-tender and normal bowel sounds Musculoskeletal:no cyanosis of digits and no clubbing  NEURO: alert & oriented x 3 with fluent speech, no focal motor/sensory deficits  LABORATORY DATA:  I have reviewed the data as listed    Component Value Date/Time   NA 138 04/12/2024 0207   NA 139 11/26/2023 1459   K 4.1 04/12/2024 0207   CL 92 (L) 04/12/2024 0207   CO2 36 (H) 04/12/2024 0207   GLUCOSE 90 04/12/2024 0207   BUN 45 (H) 04/12/2024 0207   BUN 56 (H) 11/26/2023 1459   CREATININE 1.54 (H) 04/12/2024 0207   CREATININE 2.92 (H) 02/02/2024 1324   CREATININE 2.12 (H) 06/23/2023 1518   CALCIUM  8.6 (L) 04/12/2024 0207   PROT 6.0 (L) 04/02/2024 1537   PROT 7.2 06/09/2014 1317   ALBUMIN  3.2 (L) 04/02/2024 1537   ALBUMIN  3.8 06/09/2014 1317   AST 35 04/02/2024 1537   AST 15 10/01/2023 1505   ALT 19 04/02/2024 1537   ALT 6 10/01/2023 1505   ALT 23 06/09/2014 1317   ALKPHOS 62 04/02/2024 1537   ALKPHOS 71 06/09/2014 1317   BILITOT 0.8 04/02/2024 1537   BILITOT 0.6 10/01/2023 1505   GFRNONAA 32 (L) 04/12/2024 0207   GFRNONAA 15 (L) 02/02/2024 1324   GFRAA 39 (L) 12/23/2019 1458    Lab Results  Component Value Date   WBC 4.1 04/21/2024   NEUTROABS 3.1 04/21/2024   HGB 9.4 (L) 04/21/2024    HCT 32.0 (L) 04/21/2024   MCV 92.5 04/21/2024   PLT 206 04/21/2024    RADIOGRAPHIC STUDIES: DG CHEST PORT 1 VIEW Result Date: 04/10/2024 EXAM: 1 VIEW(S) XRAY OF THE CHEST 04/10/2024 08:34:00 AM COMPARISON:  04/08/2024 CLINICAL HISTORY: Dyspnea FINDINGS: LINES, TUBES AND DEVICES: Stable left chest AICD. LUNGS AND PLEURA: Small bilateral pleural effusions. Dense left lung base opacification, mildly increased, possibly from pneumonia or atelectasis. Stable findings of pulmonary venous hypertension. No pneumothorax. HEART AND MEDIASTINUM: Stable cardiomegaly and mediastinal contours. Calcified aortic atherosclerosis. BONES AND SOFT TISSUES: Stable visualized osseous structures, including benign sclerotic lesion of left proximal humerus. IMPRESSION: 1. Dense left lung base opacification, mildly increased, which may reflect pneumonia or atelectasis. 2. Small bilateral pleural effusions. 3. Stable cardiomegaly with pulmonary venous hypertension, stable mediastinal contours, left chest AICD, and calcified aortic atherosclerosis. 4. Several additional stable chronic and incidental findings are present, as detailed in the body of the report. Electronically signed by: Ryan Salvage MD 04/10/2024 09:05 AM EST RP Workstation: [id number]   DG Chest Port 1 View Result Date: 04/09/2024 EXAM: 1 VIEW(S) XRAY OF THE CHEST 04/09/2024 12:07:55 AM COMPARISON: 04/02/2024 CLINICAL HISTORY: SOB (shortness of breath) FINDINGS: LUNGS AND PLEURA: Small bilateral pleural effusions. Chronic coarsened interstitial markings. Left basilar opacity. No pneumothorax. HEART AND MEDIASTINUM: Cardiomegaly. Atherosclerotic calcifications. Left chest AICD/pacemaker in place. BONES AND SOFT TISSUES: Similar sclerotic lesion of the left proximal humerus. IMPRESSION: 1. Chronic coarsened interstitial markings consistent with edema. Small bilateral pleural effusions. 2. Left basilar opacity, which may reflect atelectasis or superimposed infection.  Electronically signed by: Norman Gatlin MD 04/09/2024 12:14 AM EST RP Workstation: [id number]   ECHOCARDIOGRAM COMPLETE Result Date: 04/04/2024    ECHOCARDIOGRAM REPORT   Patient Name:   [name] Date of Exam: 04/04/2024 Medical Rec #:  [id number]         Height:       66.0 in Accession #:    [id number]        Weight:       138.2 lb Date of Birth:  May 22, 1933         BSA:          1.709 m Patient Age:    89 years          BP:           120/48 mmHg Patient Gender: F                 HR:           70 bpm. Exam Location:  Inpatient Procedure: 2D Echo, Cardiac Doppler and Color Doppler (Both Spectral and Color            Flow Doppler were utilized during procedure). Indications:    I50.40* Unspecified combined systolic (congestive) and diastolic                 (congestive) heart failure  History:        Patient has prior history of Echocardiogram examinations, most                 recent 05/28/2023. CHF and Cardiomyopathy, Defibrillator and                 Abnormal ECG, COPD, Mitral Valve Disease and Aortic Valve                 Disease, Arrythmias:Bradycardia, Signs/Symptoms:Bacteremia; Risk                 Factors:Hypertension.  Sonographer:    Ellouise Mose RDCS Referring Phys: [id number] [facility]  Sonographer Comments: Technically difficult study due to poor echo windows. Very difficult angle due to patient position on right of bed and could not turn  due to leg injury. IMPRESSIONS  1. Left ventricular ejection fraction, by estimation, is 55 to 60%. The left ventricle has normal function. The left ventricle has no regional wall motion abnormalities. Left ventricular diastolic parameters are consistent with Grade III diastolic dysfunction (restrictive). Elevated left atrial pressure. There is the interventricular septum is flattened in systole and diastole, consistent with right ventricular pressure and volume overload.  2. Right ventricular systolic function is moderately reduced. The right ventricular  size is normal. There is severely elevated pulmonary artery systolic pressure. The estimated right ventricular systolic pressure is 91.5 mmHg.  3. Right atrial size was moderately dilated.  4. The mitral valve is degenerative. Mild to moderate mitral valve regurgitation. Moderate mitral stenosis. The mean mitral valve gradient is 7.0 mmHg. Moderate mitral annular calcification.  5. Tricuspid valve regurgitation is severe.  6. The aortic valve is calcified. There is severe calcifcation of the aortic valve. There is severe thickening of the aortic valve. Aortic valve regurgitation is mild. Moderate aortic valve stenosis. Aortic valve area, by VTI measures 1.18 cm. Aortic valve mean gradient measures 20.5 mmHg. Aortic valve Vmax measures 3.00 m/s.  7. The inferior vena cava is dilated in size with >50% respiratory variability, suggesting right atrial pressure of 8 mmHg.  8. A small pericardial effusion is present. The pericardial effusion is circumferential. Moderate pleural effusion in the left lateral region. FINDINGS  Left Ventricle: Left ventricular ejection fraction, by estimation, is 55 to 60%. The left ventricle has normal function. The left ventricle has no regional wall motion abnormalities. The left ventricular internal cavity size was normal in size. There is  no left ventricular hypertrophy. The interventricular septum is flattened in systole and diastole, consistent with right ventricular pressure and volume overload. Left ventricular diastolic parameters are consistent with Grade III diastolic dysfunction (restrictive). Elevated left atrial pressure. Right Ventricle: The right ventricular size is normal. No increase in right ventricular wall thickness. Right ventricular systolic function is moderately reduced. There is severely elevated pulmonary artery systolic pressure. The tricuspid regurgitant velocity is 4.57 m/s, and with an assumed right atrial pressure of 8 mmHg, the estimated right ventricular  systolic pressure is 91.5 mmHg. Left Atrium: Left atrial size was normal in size. Right Atrium: Right atrial size was moderately dilated. Pericardium: A small pericardial effusion is present. The pericardial effusion is circumferential. Mitral Valve: The mitral valve is degenerative in appearance. There is moderate thickening of the mitral valve leaflet(s). Moderate mitral annular calcification. Mild to moderate mitral valve regurgitation. Moderate mitral valve stenosis. MV peak gradient, 22.5 mmHg. The mean mitral valve gradient is 7.0 mmHg. Tricuspid Valve: The tricuspid valve is normal in structure. Tricuspid valve regurgitation is severe. No evidence of tricuspid stenosis. Aortic Valve: The aortic valve is calcified. There is severe calcifcation of the aortic valve. There is severe thickening of the aortic valve. Aortic valve regurgitation is mild. Moderate aortic stenosis is present. Aortic valve mean gradient measures 20.5 mmHg. Aortic valve peak gradient measures 36.1 mmHg. Aortic valve area, by VTI measures 1.18 cm. Pulmonic Valve: The pulmonic valve was normal in structure. Pulmonic valve regurgitation is mild to moderate. No evidence of pulmonic stenosis. Aorta: The aortic root and ascending aorta are structurally normal, with no evidence of dilitation. Venous: The inferior vena cava is dilated in size with greater than 50% respiratory variability, suggesting right atrial pressure of 8 mmHg. IAS/Shunts: No atrial level shunt detected by color flow Doppler. Additional Comments: A device lead is visualized. There is a  moderate pleural effusion in the left lateral region.  LEFT VENTRICLE PLAX 2D LVIDd:         4.30 cm     Diastology LVIDs:         2.80 cm     LV e' medial:    9.14 cm/s LV PW:         1.00 cm     LV E/e' medial:  23.4 LV IVS:        1.10 cm     LV e' lateral:   9.68 cm/s LVOT diam:     2.10 cm     LV E/e' lateral: 22.1 LV SV:         82 LV SV Index:   48 LVOT Area:     3.46 cm  LV Volumes  (MOD) LV vol d, MOD A2C: 90.5 ml LV vol d, MOD A4C: 22.7 ml LV vol s, MOD A2C: 17.4 ml LV vol s, MOD A4C: 19.2 ml LV SV MOD A2C:     73.1 ml LV SV MOD A4C:     22.7 ml LV SV MOD BP:      18.1 ml RIGHT VENTRICLE            IVC RV S prime:     9.79 cm/s  IVC diam: 2.60 cm TAPSE (M-mode): 1.1 cm                            PULMONARY VEINS                            Diastolic Velocity: 53.50 cm/s                            S/D Velocity:       1.30                            Systolic Velocity:  69.60 cm/s LEFT ATRIUM             Index        RIGHT ATRIUM           Index LA diam:        4.30 cm 2.52 cm/m   RA Area:     20.60 cm LA Vol (A2C):   46.2 ml 27.03 ml/m  RA Volume:   55.90 ml  32.70 ml/m LA Vol (A4C):   33.4 ml 19.54 ml/m LA Biplane Vol: 41.9 ml 24.51 ml/m  AORTIC VALVE AV Area (Vmax):    1.24 cm AV Area (Vmean):   1.18 cm AV Area (VTI):     1.18 cm AV Vmax:           300.50 cm/s AV Vmean:          213.500 cm/s AV VTI:            0.698 m AV Peak Grad:      36.1 mmHg AV Mean Grad:      20.5 mmHg LVOT Vmax:         108.00 cm/s LVOT Vmean:        72.600 cm/s LVOT VTI:          0.237 m LVOT/AV VTI ratio: 0.34  AORTA Ao Root diam: 2.80 cm MITRAL VALVE  TRICUSPID VALVE MV Area (PHT): 4.60 cm     TR Peak grad:   83.5 mmHg MV Area VTI:   1.56 cm     TR Vmax:        457.00 cm/s MV Peak grad:  22.5 mmHg MV Mean grad:  7.0 mmHg     SHUNTS MV Vmax:       2.37 m/s     Systemic VTI:  0.24 m MV Vmean:      116.0 cm/s   Systemic Diam: 2.10 cm MV Decel Time: 165 msec MV E velocity: 214.00 cm/s MV A velocity: 98.50 cm/s MV E/A ratio:  2.17 Wilbert Bihari MD Electronically signed by Wilbert Bihari MD Signature Date/Time: 04/04/2024/11:08:14 AM    Final    DG Chest Portable 1 View Result Date: 04/02/2024 EXAM: 1 VIEW(S) XRAY OF THE CHEST 04/02/2024 02:47:00 PM COMPARISON: 09/03/2023 CLINICAL HISTORY: sob FINDINGS: LINES, TUBES AND DEVICES: AICD leads are unchanged and appear in appropriate position. LUNGS AND  PLEURA: Mild pulmonary edema with diffuse accentuated interstitial markings. Blunting of the costophrenic angles favoring pleural effusions, right greater than left. No pneumothorax. HEART AND MEDIASTINUM: Cardiomegaly. Calcified aorta. AICD leads are unchanged and appear in appropriate position. BONES AND SOFT TISSUES: Stable benign appearing sclerotic lesion proximal left humerus. No acute osseous abnormality. IMPRESSION: 1. Mild pulmonary edema with diffuse accentuated interstitial markings. 2. Blunting of the costophrenic angles favoring pleural effusions, right greater than left. 3. Cardiomegaly with unchanged AICD leads. Electronically signed by: Ryan Salvage MD 04/02/2024 03:23 PM EST RP Workstation: [id number]   DG Tibia/Fibula Left Port Result Date: 04/02/2024 CLINICAL DATA:  Fall EXAM: PORTABLE LEFT TIBIA AND FIBULA - 2 VIEW COMPARISON:  None Available. FINDINGS: There is soft tissue laceration or ulceration of the anterior lower extremity. There is no underlying periosteal reaction, fracture or dislocation. The bones are diffusely osteopenic. There is diffuse soft tissue swelling of the lower extremity. IMPRESSION: Soft tissue laceration or ulceration of the anterior lower extremity. No underlying periosteal reaction, fracture or dislocation. Electronically Signed   By: Greig Pique M.D.   On: 04/02/2024 15:16

## 2024-04-20 ENCOUNTER — Other Ambulatory Visit: Payer: Self-pay

## 2024-04-20 ENCOUNTER — Telehealth: Payer: Self-pay

## 2024-04-20 DIAGNOSIS — D500 Iron deficiency anemia secondary to blood loss (chronic): Secondary | ICD-10-CM

## 2024-04-20 DIAGNOSIS — D649 Anemia, unspecified: Secondary | ICD-10-CM

## 2024-04-20 DIAGNOSIS — D3A8 Other benign neuroendocrine tumors: Secondary | ICD-10-CM

## 2024-04-20 NOTE — Telephone Encounter (Signed)
 Copied from CRM #[id number]. Topic: General - Other >> Apr 20, 2024  2:57 PM Victoria A wrote: Reason for CRM: Fidela the nurse from [facility] said patient wants to hold off until after Christmas.

## 2024-04-21 ENCOUNTER — Encounter: Payer: Self-pay | Admitting: Nurse Practitioner

## 2024-04-21 ENCOUNTER — Inpatient Hospital Stay: Attending: Nurse Practitioner

## 2024-04-21 ENCOUNTER — Other Ambulatory Visit: Payer: Self-pay

## 2024-04-21 ENCOUNTER — Inpatient Hospital Stay

## 2024-04-21 ENCOUNTER — Inpatient Hospital Stay: Admitting: Nurse Practitioner

## 2024-04-21 VITALS — BP 130/60 | HR 70 | Temp 97.3°F | Resp 17 | Wt 114.0 lb

## 2024-04-21 DIAGNOSIS — N184 Chronic kidney disease, stage 4 (severe): Secondary | ICD-10-CM | POA: Diagnosis present

## 2024-04-21 DIAGNOSIS — D631 Anemia in chronic kidney disease: Secondary | ICD-10-CM | POA: Insufficient documentation

## 2024-04-21 DIAGNOSIS — D500 Iron deficiency anemia secondary to blood loss (chronic): Secondary | ICD-10-CM

## 2024-04-21 DIAGNOSIS — D649 Anemia, unspecified: Secondary | ICD-10-CM | POA: Diagnosis not present

## 2024-04-21 DIAGNOSIS — D3A8 Other benign neuroendocrine tumors: Secondary | ICD-10-CM | POA: Diagnosis not present

## 2024-04-21 DIAGNOSIS — Z79899 Other long term (current) drug therapy: Secondary | ICD-10-CM | POA: Insufficient documentation

## 2024-04-21 DIAGNOSIS — K5520 Angiodysplasia of colon without hemorrhage: Secondary | ICD-10-CM

## 2024-04-21 LAB — SAMPLE TO BLOOD BANK

## 2024-04-21 LAB — CBC WITH DIFFERENTIAL (CANCER CENTER ONLY)
Abs Immature Granulocytes: 0.02 K/uL (ref 0.00–0.07)
Basophils Absolute: 0 K/uL (ref 0.0–0.1)
Basophils Relative: 1 %
Eosinophils Absolute: 0 K/uL (ref 0.0–0.5)
Eosinophils Relative: 0 %
HCT: 32 % — ABNORMAL LOW (ref 36.0–46.0)
Hemoglobin: 9.4 g/dL — ABNORMAL LOW (ref 12.0–15.0)
Immature Granulocytes: 1 %
Lymphocytes Relative: 13 %
Lymphs Abs: 0.5 K/uL — ABNORMAL LOW (ref 0.7–4.0)
MCH: 27.2 pg (ref 26.0–34.0)
MCHC: 29.4 g/dL — ABNORMAL LOW (ref 30.0–36.0)
MCV: 92.5 fL (ref 80.0–100.0)
Monocytes Absolute: 0.4 K/uL (ref 0.1–1.0)
Monocytes Relative: 10 %
Neutro Abs: 3.1 K/uL (ref 1.7–7.7)
Neutrophils Relative %: 75 %
Platelet Count: 206 K/uL (ref 150–400)
RBC: 3.46 MIL/uL — ABNORMAL LOW (ref 3.87–5.11)
RDW: 19.3 % — ABNORMAL HIGH (ref 11.5–15.5)
WBC Count: 4.1 K/uL (ref 4.0–10.5)
nRBC: 0 % (ref 0.0–0.2)

## 2024-04-21 LAB — TOTAL PROTEIN, URINE DIPSTICK: Protein, ur: NEGATIVE mg/dL

## 2024-04-21 LAB — FERRITIN: Ferritin: 362 ng/mL — ABNORMAL HIGH (ref 11–307)

## 2024-04-21 MED ORDER — OCTREOTIDE ACETATE 20 MG IM KIT
20.0000 mg | PACK | Freq: Once | INTRAMUSCULAR | Status: AC
  Administered 2024-04-21: 14:00:00 20 mg via INTRAMUSCULAR
  Filled 2024-04-21: qty 1

## 2024-04-21 MED ORDER — EPOETIN ALFA-EPBX 40000 UNIT/ML IJ SOLN
40000.0000 [IU] | Freq: Once | INTRAMUSCULAR | Status: AC
  Administered 2024-04-21: 14:00:00 40000 [IU] via SUBCUTANEOUS
  Filled 2024-04-21: qty 1

## 2024-04-21 NOTE — Assessment & Plan Note (Signed)
 Patient currently receiving Aranesp injections every 2 weeks.  Blood counts improving.  Has not needed blood transfusion since 06/23/2023. -07/28/2023 -Hgb 8.2.  Blood transfusion not needed this week.  Recheck labs in 1 week.  Add ferritin. -Continue Aranesp injections every 2 weeks.  Blood transfusions as needed.  2 units PRBCs for hemoglobin <7.0.  1 unit PRBCs for Hgb between 7 and 8. - 02/02/2024 -Hgb 9.0 today.  No requirement for blood transfusion.  Administer Retacrit  today.  Arrange for IV iron  as indicated based on results of ferritin when available. -04/21/2024 -stable and improved anemia.  Last received IV iron  and blood transfusion while in the hospital, 04/08/2024 and 04/09/2024 respectively.  Awaiting results of ferritin.  No requirement for blood transfusion or iron  infusion today.  Continue to check every 2 weeks.  Retacrit  to be given today and continued every 2 weeks.

## 2024-04-22 ENCOUNTER — Telehealth (HOSPITAL_COMMUNITY): Payer: Self-pay

## 2024-04-22 NOTE — Telephone Encounter (Signed)
 Called to confirm/remind patient of their appointment [facility] on 04/1924 2:00.   Appointment:   [] Confirmed  [] Left mess   [] No answer/No voice mail  [x] VM Full/unable to leave message  [] Phone not in service  Patient reminded to bring all medications and/or complete list.  Confirmed patient has transportation. Gave directions, instructed to utilize valet parking.

## 2024-04-23 ENCOUNTER — Ambulatory Visit (HOSPITAL_COMMUNITY)

## 2024-05-05 ENCOUNTER — Inpatient Hospital Stay

## 2024-05-08 ENCOUNTER — Other Ambulatory Visit: Payer: Self-pay

## 2024-05-08 ENCOUNTER — Encounter (HOSPITAL_COMMUNITY): Payer: Self-pay

## 2024-05-08 ENCOUNTER — Inpatient Hospital Stay (HOSPITAL_COMMUNITY)
Admission: EM | Admit: 2024-05-08 | Discharge: 2024-05-15 | DRG: 291 | Attending: Family Medicine | Admitting: Family Medicine

## 2024-05-08 DIAGNOSIS — I081 Rheumatic disorders of both mitral and tricuspid valves: Secondary | ICD-10-CM | POA: Diagnosis present

## 2024-05-08 DIAGNOSIS — Z87891 Personal history of nicotine dependence: Secondary | ICD-10-CM

## 2024-05-08 DIAGNOSIS — D631 Anemia in chronic kidney disease: Secondary | ICD-10-CM | POA: Diagnosis present

## 2024-05-08 DIAGNOSIS — E7800 Pure hypercholesterolemia, unspecified: Secondary | ICD-10-CM | POA: Diagnosis present

## 2024-05-08 DIAGNOSIS — Z8507 Personal history of malignant neoplasm of pancreas: Secondary | ICD-10-CM

## 2024-05-08 DIAGNOSIS — Z905 Acquired absence of kidney: Secondary | ICD-10-CM

## 2024-05-08 DIAGNOSIS — Z79899 Other long term (current) drug therapy: Secondary | ICD-10-CM

## 2024-05-08 DIAGNOSIS — Z515 Encounter for palliative care: Secondary | ICD-10-CM

## 2024-05-08 DIAGNOSIS — N179 Acute kidney failure, unspecified: Secondary | ICD-10-CM | POA: Diagnosis not present

## 2024-05-08 DIAGNOSIS — G9340 Encephalopathy, unspecified: Secondary | ICD-10-CM

## 2024-05-08 DIAGNOSIS — J9611 Chronic respiratory failure with hypoxia: Secondary | ICD-10-CM | POA: Diagnosis present

## 2024-05-08 DIAGNOSIS — R54 Age-related physical debility: Secondary | ICD-10-CM | POA: Diagnosis present

## 2024-05-08 DIAGNOSIS — Z8616 Personal history of COVID-19: Secondary | ICD-10-CM

## 2024-05-08 DIAGNOSIS — I959 Hypotension, unspecified: Secondary | ICD-10-CM | POA: Diagnosis not present

## 2024-05-08 DIAGNOSIS — S81812A Laceration without foreign body, left lower leg, initial encounter: Secondary | ICD-10-CM | POA: Diagnosis present

## 2024-05-08 DIAGNOSIS — Z885 Allergy status to narcotic agent status: Secondary | ICD-10-CM

## 2024-05-08 DIAGNOSIS — R64 Cachexia: Secondary | ICD-10-CM | POA: Diagnosis present

## 2024-05-08 DIAGNOSIS — S81802A Unspecified open wound, left lower leg, initial encounter: Secondary | ICD-10-CM | POA: Diagnosis present

## 2024-05-08 DIAGNOSIS — I5033 Acute on chronic diastolic (congestive) heart failure: Secondary | ICD-10-CM | POA: Diagnosis present

## 2024-05-08 DIAGNOSIS — I509 Heart failure, unspecified: Secondary | ICD-10-CM

## 2024-05-08 DIAGNOSIS — Z85528 Personal history of other malignant neoplasm of kidney: Secondary | ICD-10-CM

## 2024-05-08 DIAGNOSIS — Z888 Allergy status to other drugs, medicaments and biological substances status: Secondary | ICD-10-CM

## 2024-05-08 DIAGNOSIS — G9341 Metabolic encephalopathy: Secondary | ICD-10-CM | POA: Diagnosis present

## 2024-05-08 DIAGNOSIS — Z9049 Acquired absence of other specified parts of digestive tract: Secondary | ICD-10-CM

## 2024-05-08 DIAGNOSIS — D3A8 Other benign neuroendocrine tumors: Secondary | ICD-10-CM | POA: Diagnosis present

## 2024-05-08 DIAGNOSIS — F32A Depression, unspecified: Secondary | ICD-10-CM | POA: Diagnosis present

## 2024-05-08 DIAGNOSIS — I5084 End stage heart failure: Secondary | ICD-10-CM | POA: Diagnosis present

## 2024-05-08 DIAGNOSIS — Z8249 Family history of ischemic heart disease and other diseases of the circulatory system: Secondary | ICD-10-CM

## 2024-05-08 DIAGNOSIS — L03116 Cellulitis of left lower limb: Principal | ICD-10-CM | POA: Diagnosis present

## 2024-05-08 DIAGNOSIS — I272 Pulmonary hypertension, unspecified: Secondary | ICD-10-CM | POA: Diagnosis present

## 2024-05-08 DIAGNOSIS — I2510 Atherosclerotic heart disease of native coronary artery without angina pectoris: Secondary | ICD-10-CM | POA: Diagnosis present

## 2024-05-08 DIAGNOSIS — I739 Peripheral vascular disease, unspecified: Secondary | ICD-10-CM | POA: Diagnosis present

## 2024-05-08 DIAGNOSIS — Z85828 Personal history of other malignant neoplasm of skin: Secondary | ICD-10-CM

## 2024-05-08 DIAGNOSIS — Z886 Allergy status to analgesic agent status: Secondary | ICD-10-CM

## 2024-05-08 DIAGNOSIS — E876 Hypokalemia: Secondary | ICD-10-CM | POA: Diagnosis not present

## 2024-05-08 DIAGNOSIS — I4821 Permanent atrial fibrillation: Secondary | ICD-10-CM | POA: Diagnosis present

## 2024-05-08 DIAGNOSIS — Z90710 Acquired absence of both cervix and uterus: Secondary | ICD-10-CM

## 2024-05-08 DIAGNOSIS — Z803 Family history of malignant neoplasm of breast: Secondary | ICD-10-CM

## 2024-05-08 DIAGNOSIS — Z66 Do not resuscitate: Secondary | ICD-10-CM | POA: Diagnosis present

## 2024-05-08 DIAGNOSIS — I130 Hypertensive heart and chronic kidney disease with heart failure and stage 1 through stage 4 chronic kidney disease, or unspecified chronic kidney disease: Principal | ICD-10-CM | POA: Diagnosis present

## 2024-05-08 DIAGNOSIS — Z7901 Long term (current) use of anticoagulants: Secondary | ICD-10-CM

## 2024-05-08 DIAGNOSIS — Z96643 Presence of artificial hip joint, bilateral: Secondary | ICD-10-CM | POA: Diagnosis present

## 2024-05-08 DIAGNOSIS — I878 Other specified disorders of veins: Secondary | ICD-10-CM | POA: Diagnosis present

## 2024-05-08 DIAGNOSIS — Z9981 Dependence on supplemental oxygen: Secondary | ICD-10-CM

## 2024-05-08 DIAGNOSIS — I428 Other cardiomyopathies: Secondary | ICD-10-CM | POA: Diagnosis present

## 2024-05-08 DIAGNOSIS — D500 Iron deficiency anemia secondary to blood loss (chronic): Secondary | ICD-10-CM | POA: Diagnosis present

## 2024-05-08 DIAGNOSIS — K5520 Angiodysplasia of colon without hemorrhage: Secondary | ICD-10-CM | POA: Diagnosis present

## 2024-05-08 DIAGNOSIS — G8929 Other chronic pain: Secondary | ICD-10-CM | POA: Diagnosis present

## 2024-05-08 DIAGNOSIS — E872 Acidosis, unspecified: Secondary | ICD-10-CM | POA: Diagnosis present

## 2024-05-08 DIAGNOSIS — J449 Chronic obstructive pulmonary disease, unspecified: Secondary | ICD-10-CM | POA: Diagnosis present

## 2024-05-08 DIAGNOSIS — F419 Anxiety disorder, unspecified: Secondary | ICD-10-CM | POA: Diagnosis not present

## 2024-05-08 DIAGNOSIS — N1832 Chronic kidney disease, stage 3b: Secondary | ICD-10-CM | POA: Diagnosis present

## 2024-05-08 DIAGNOSIS — Z95810 Presence of automatic (implantable) cardiac defibrillator: Secondary | ICD-10-CM

## 2024-05-08 NOTE — ED Triage Notes (Signed)
 BIB EMS for infection to the left lower leg. Has been here on the 28 of November for a laceration to that leg. Now the wound is deep, bone visible, dark surrounding tissue and does have an odor. The other lower leg is red and swollen as well. Pt has a history of pancreatic Ca.

## 2024-05-09 ENCOUNTER — Emergency Department (HOSPITAL_COMMUNITY)

## 2024-05-09 DIAGNOSIS — D500 Iron deficiency anemia secondary to blood loss (chronic): Secondary | ICD-10-CM | POA: Diagnosis present

## 2024-05-09 DIAGNOSIS — S81802A Unspecified open wound, left lower leg, initial encounter: Secondary | ICD-10-CM | POA: Diagnosis present

## 2024-05-09 DIAGNOSIS — N189 Chronic kidney disease, unspecified: Secondary | ICD-10-CM | POA: Diagnosis not present

## 2024-05-09 DIAGNOSIS — E872 Acidosis, unspecified: Secondary | ICD-10-CM | POA: Diagnosis present

## 2024-05-09 DIAGNOSIS — Z66 Do not resuscitate: Secondary | ICD-10-CM | POA: Diagnosis present

## 2024-05-09 DIAGNOSIS — J9611 Chronic respiratory failure with hypoxia: Secondary | ICD-10-CM | POA: Diagnosis present

## 2024-05-09 DIAGNOSIS — I081 Rheumatic disorders of both mitral and tricuspid valves: Secondary | ICD-10-CM | POA: Diagnosis present

## 2024-05-09 DIAGNOSIS — I130 Hypertensive heart and chronic kidney disease with heart failure and stage 1 through stage 4 chronic kidney disease, or unspecified chronic kidney disease: Secondary | ICD-10-CM | POA: Diagnosis present

## 2024-05-09 DIAGNOSIS — Z7901 Long term (current) use of anticoagulants: Secondary | ICD-10-CM | POA: Diagnosis not present

## 2024-05-09 DIAGNOSIS — I428 Other cardiomyopathies: Secondary | ICD-10-CM | POA: Diagnosis present

## 2024-05-09 DIAGNOSIS — G9340 Encephalopathy, unspecified: Secondary | ICD-10-CM

## 2024-05-09 DIAGNOSIS — D631 Anemia in chronic kidney disease: Secondary | ICD-10-CM | POA: Diagnosis present

## 2024-05-09 DIAGNOSIS — F32A Depression, unspecified: Secondary | ICD-10-CM | POA: Diagnosis present

## 2024-05-09 DIAGNOSIS — I5033 Acute on chronic diastolic (congestive) heart failure: Secondary | ICD-10-CM | POA: Diagnosis present

## 2024-05-09 DIAGNOSIS — L03116 Cellulitis of left lower limb: Secondary | ICD-10-CM | POA: Diagnosis present

## 2024-05-09 DIAGNOSIS — I739 Peripheral vascular disease, unspecified: Secondary | ICD-10-CM | POA: Diagnosis present

## 2024-05-09 DIAGNOSIS — R64 Cachexia: Secondary | ICD-10-CM | POA: Diagnosis present

## 2024-05-09 DIAGNOSIS — J449 Chronic obstructive pulmonary disease, unspecified: Secondary | ICD-10-CM | POA: Diagnosis present

## 2024-05-09 DIAGNOSIS — R0602 Shortness of breath: Secondary | ICD-10-CM | POA: Diagnosis present

## 2024-05-09 DIAGNOSIS — G9341 Metabolic encephalopathy: Secondary | ICD-10-CM | POA: Diagnosis present

## 2024-05-09 DIAGNOSIS — I4821 Permanent atrial fibrillation: Secondary | ICD-10-CM | POA: Diagnosis present

## 2024-05-09 DIAGNOSIS — Z8616 Personal history of COVID-19: Secondary | ICD-10-CM | POA: Diagnosis not present

## 2024-05-09 DIAGNOSIS — C7A8 Other malignant neuroendocrine tumors: Secondary | ICD-10-CM | POA: Diagnosis not present

## 2024-05-09 DIAGNOSIS — D649 Anemia, unspecified: Secondary | ICD-10-CM | POA: Diagnosis not present

## 2024-05-09 DIAGNOSIS — I5084 End stage heart failure: Secondary | ICD-10-CM | POA: Diagnosis present

## 2024-05-09 DIAGNOSIS — N1832 Chronic kidney disease, stage 3b: Secondary | ICD-10-CM | POA: Diagnosis present

## 2024-05-09 DIAGNOSIS — Z515 Encounter for palliative care: Secondary | ICD-10-CM | POA: Diagnosis not present

## 2024-05-09 DIAGNOSIS — I272 Pulmonary hypertension, unspecified: Secondary | ICD-10-CM | POA: Diagnosis present

## 2024-05-09 DIAGNOSIS — D3A8 Other benign neuroendocrine tumors: Secondary | ICD-10-CM | POA: Diagnosis present

## 2024-05-09 DIAGNOSIS — N179 Acute kidney failure, unspecified: Secondary | ICD-10-CM | POA: Diagnosis not present

## 2024-05-09 LAB — I-STAT VENOUS BLOOD GAS, ED
Acid-Base Excess: 0 mmol/L (ref 0.0–2.0)
Bicarbonate: 22.7 mmol/L (ref 20.0–28.0)
Calcium, Ion: 0.99 mmol/L — ABNORMAL LOW (ref 1.15–1.40)
HCT: 28 % — ABNORMAL LOW (ref 36.0–46.0)
Hemoglobin: 9.5 g/dL — ABNORMAL LOW (ref 12.0–15.0)
O2 Saturation: 66 %
Potassium: 3.5 mmol/L (ref 3.5–5.1)
Sodium: 140 mmol/L (ref 135–145)
TCO2: 24 mmol/L (ref 22–32)
pCO2, Ven: 28.5 mmHg — ABNORMAL LOW (ref 44–60)
pH, Ven: 7.51 — ABNORMAL HIGH (ref 7.25–7.43)
pO2, Ven: 30 mmHg — CL (ref 32–45)

## 2024-05-09 LAB — CBC WITH DIFFERENTIAL/PLATELET
Abs Immature Granulocytes: 0.03 K/uL (ref 0.00–0.07)
Basophils Absolute: 0 K/uL (ref 0.0–0.1)
Basophils Relative: 0 %
Eosinophils Absolute: 0 K/uL (ref 0.0–0.5)
Eosinophils Relative: 0 %
HCT: 26 % — ABNORMAL LOW (ref 36.0–46.0)
Hemoglobin: 7.8 g/dL — ABNORMAL LOW (ref 12.0–15.0)
Immature Granulocytes: 0 %
Lymphocytes Relative: 7 %
Lymphs Abs: 0.5 K/uL — ABNORMAL LOW (ref 0.7–4.0)
MCH: 27.7 pg (ref 26.0–34.0)
MCHC: 30 g/dL (ref 30.0–36.0)
MCV: 92.2 fL (ref 80.0–100.0)
Monocytes Absolute: 0.7 K/uL (ref 0.1–1.0)
Monocytes Relative: 10 %
Neutro Abs: 6 K/uL (ref 1.7–7.7)
Neutrophils Relative %: 83 %
Platelets: 222 K/uL (ref 150–400)
RBC: 2.82 MIL/uL — ABNORMAL LOW (ref 3.87–5.11)
RDW: 19.7 % — ABNORMAL HIGH (ref 11.5–15.5)
WBC: 7.2 K/uL (ref 4.0–10.5)
nRBC: 0 % (ref 0.0–0.2)

## 2024-05-09 LAB — COMPREHENSIVE METABOLIC PANEL WITH GFR
ALT: 12 U/L (ref 0–44)
AST: 36 U/L (ref 15–41)
Albumin: 3.7 g/dL (ref 3.5–5.0)
Alkaline Phosphatase: 108 U/L (ref 38–126)
Anion gap: 15 (ref 5–15)
BUN: 69 mg/dL — ABNORMAL HIGH (ref 8–23)
CO2: 23 mmol/L (ref 22–32)
Calcium: 9.3 mg/dL (ref 8.9–10.3)
Chloride: 103 mmol/L (ref 98–111)
Creatinine, Ser: 1.39 mg/dL — ABNORMAL HIGH (ref 0.44–1.00)
GFR, Estimated: 36 mL/min — ABNORMAL LOW
Glucose, Bld: 106 mg/dL — ABNORMAL HIGH (ref 70–99)
Potassium: 3.6 mmol/L (ref 3.5–5.1)
Sodium: 140 mmol/L (ref 135–145)
Total Bilirubin: 0.7 mg/dL (ref 0.0–1.2)
Total Protein: 6.8 g/dL (ref 6.5–8.1)

## 2024-05-09 LAB — I-STAT CHEM 8, ED
BUN: 70 mg/dL — ABNORMAL HIGH (ref 8–23)
Calcium, Ion: 0.98 mmol/L — ABNORMAL LOW (ref 1.15–1.40)
Chloride: 104 mmol/L (ref 98–111)
Creatinine, Ser: 1.5 mg/dL — ABNORMAL HIGH (ref 0.44–1.00)
Glucose, Bld: 98 mg/dL (ref 70–99)
HCT: 28 % — ABNORMAL LOW (ref 36.0–46.0)
Hemoglobin: 9.5 g/dL — ABNORMAL LOW (ref 12.0–15.0)
Potassium: 3.5 mmol/L (ref 3.5–5.1)
Sodium: 139 mmol/L (ref 135–145)
TCO2: 22 mmol/L (ref 22–32)

## 2024-05-09 LAB — URINALYSIS, W/ REFLEX TO CULTURE (INFECTION SUSPECTED)
Bacteria, UA: NONE SEEN
Bilirubin Urine: NEGATIVE
Glucose, UA: NEGATIVE mg/dL
Hgb urine dipstick: NEGATIVE
Ketones, ur: NEGATIVE mg/dL
Nitrite: NEGATIVE
Protein, ur: NEGATIVE mg/dL
Specific Gravity, Urine: 1.009 (ref 1.005–1.030)
pH: 5 (ref 5.0–8.0)

## 2024-05-09 LAB — I-STAT CG4 LACTIC ACID, ED
Lactic Acid, Venous: 2.6 mmol/L (ref 0.5–1.9)
Lactic Acid, Venous: 2 mmol/L (ref 0.5–1.9)

## 2024-05-09 LAB — VITAMIN B12: Vitamin B-12: >4000 pg/mL — ABNORMAL HIGH (ref 180–914)

## 2024-05-09 LAB — PROTIME-INR
INR: 1.2 (ref 0.8–1.2)
Prothrombin Time: 16.3 s — ABNORMAL HIGH (ref 11.4–15.2)

## 2024-05-09 LAB — AMMONIA: Ammonia: 32 umol/L (ref 9–35)

## 2024-05-09 LAB — PRO BRAIN NATRIURETIC PEPTIDE: Pro Brain Natriuretic Peptide: 9242 pg/mL — ABNORMAL HIGH

## 2024-05-09 LAB — TSH: TSH: 2.49 u[IU]/mL (ref 0.350–4.500)

## 2024-05-09 MED ORDER — FUROSEMIDE 10 MG/ML IJ SOLN
100.0000 mg | Freq: Two times a day (BID) | INTRAVENOUS | Status: DC
  Administered 2024-05-09 – 2024-05-13 (×8): 100 mg via INTRAVENOUS
  Filled 2024-05-09 (×10): qty 10

## 2024-05-09 MED ORDER — LINEZOLID 600 MG/300ML IV SOLN
600.0000 mg | Freq: Two times a day (BID) | INTRAVENOUS | Status: DC
  Administered 2024-05-09 – 2024-05-13 (×9): 600 mg via INTRAVENOUS
  Filled 2024-05-09 (×9): qty 300

## 2024-05-09 MED ORDER — LINEZOLID 600 MG/300ML IV SOLN
600.0000 mg | Freq: Once | INTRAVENOUS | Status: AC
  Administered 2024-05-09: 600 mg via INTRAVENOUS
  Filled 2024-05-09: qty 300

## 2024-05-09 MED ORDER — ACETAMINOPHEN 650 MG RE SUPP: 650.0000 mg | Freq: Four times a day (QID) | RECTAL | Status: DC | PRN

## 2024-05-09 MED ORDER — HEPARIN SODIUM (PORCINE) 5000 UNIT/ML IJ SOLN
5000.0000 [IU] | Freq: Three times a day (TID) | INTRAMUSCULAR | Status: DC
  Administered 2024-05-09 – 2024-05-14 (×15): 5000 [IU] via SUBCUTANEOUS
  Filled 2024-05-09 (×17): qty 1

## 2024-05-09 MED ORDER — FUROSEMIDE 10 MG/ML IJ SOLN
100.0000 mg | Freq: Once | INTRAVENOUS | Status: AC
  Administered 2024-05-09: 100 mg via INTRAVENOUS
  Filled 2024-05-09: qty 10

## 2024-05-09 MED ORDER — PIPERACILLIN-TAZOBACTAM 3.375 G IVPB
3.3750 g | Freq: Three times a day (TID) | INTRAVENOUS | Status: DC
  Administered 2024-05-09 – 2024-05-13 (×11): 3.375 g via INTRAVENOUS
  Filled 2024-05-09 (×14): qty 50

## 2024-05-09 MED ORDER — TRAMADOL HCL 50 MG PO TABS
50.0000 mg | ORAL_TABLET | Freq: Three times a day (TID) | ORAL | Status: DC | PRN
  Administered 2024-05-09 – 2024-05-14 (×9): 50 mg via ORAL
  Filled 2024-05-09 (×10): qty 1

## 2024-05-09 MED ORDER — IPRATROPIUM-ALBUTEROL 0.5-2.5 (3) MG/3ML IN SOLN: 3.0000 mL | Freq: Four times a day (QID) | RESPIRATORY_TRACT | Status: DC | PRN

## 2024-05-09 MED ORDER — ROSUVASTATIN CALCIUM 5 MG PO TABS
10.0000 mg | ORAL_TABLET | Freq: Every day | ORAL | Status: DC
  Administered 2024-05-10 – 2024-05-14 (×5): 10 mg via ORAL
  Filled 2024-05-09 (×6): qty 2

## 2024-05-09 MED ORDER — PIPERACILLIN-TAZOBACTAM 3.375 G IVPB 30 MIN
3.3750 g | Freq: Once | INTRAVENOUS | Status: AC
  Administered 2024-05-09: 3.375 g via INTRAVENOUS
  Filled 2024-05-09: qty 50

## 2024-05-09 MED ORDER — METOLAZONE 2.5 MG PO TABS
2.5000 mg | ORAL_TABLET | ORAL | Status: DC
  Administered 2024-05-11 – 2024-05-13 (×2): 2.5 mg via ORAL
  Filled 2024-05-09 (×4): qty 1

## 2024-05-09 MED ORDER — ACETAMINOPHEN 325 MG PO TABS
650.0000 mg | ORAL_TABLET | Freq: Four times a day (QID) | ORAL | Status: DC | PRN
  Administered 2024-05-09 – 2024-05-11 (×3): 650 mg via ORAL
  Filled 2024-05-09 (×4): qty 2

## 2024-05-09 NOTE — Consult Note (Signed)
 WOC Nurse Consult Note: Reason for Consult: Left lower extremity wound  Wound type: trauma LLE pretibial. Hx of venous stasis vs. Arterial wound on this same extremity; distal pretibial  Xray -osteomyelitis  Pressure Injury POA:NA Measurement: see nursing flow sheets Wound bed: Proximal wound; former hematoma; appears to be intact; dark discolored Distal LLE pretibial; history of venous stasis; non granular, pale, yellow drainage Left lateral foot; unable to assess in photo taken  Drainage (amount, consistency, odor) see nursing flow sheets Periwound: edema  Dressing procedure/placement/frequency: Cleanse proximal pretibial LLE wound with saline, pat dry. Apply single layer of xeroform and top with foam  Cleanse distal LLE wounds; both foot and pretibial with Vashe ([facility]# [id number]), pat dry. Apply silver  hydrofiber (Aquacel Ag+) and cover with foam Will address left lateral foot when photos placed in the chart.   Noted in initial presentation to ED, nursing reported bone visible; I am not able to see this in the photos taken.  If this is true would consult orthopedics.    Re consult if needed, will not follow at this time. Thanks  [name] Weatherly [employer] MSN, RN,CWOCN, CNS, [place]TTE [phone]    05/10/24; no photo added of the lateral foot wound; requested MD and RN to take photo and re-consult if needed  [name], CNS, CWON-AP [phone]  05/10/24: new photo lateral foot; cleanse with saline, apply xeroform and top with foam. Added orders.  [name] [facility], CNS, CWON-AP [phone]

## 2024-05-09 NOTE — ED Notes (Signed)
 Cardiology at bedside.

## 2024-05-09 NOTE — ED Notes (Signed)
 Patient found semi-fowler in bed awakens to verbal and states name. Patient seems confused at this time. Oxygen  applied found in bed via [place]

## 2024-05-09 NOTE — ED Notes (Signed)
"  Patient taken to C.T at this time.  "

## 2024-05-09 NOTE — Consult Note (Signed)
 "  Cardiology Consultation   Patient ID: AARADHYA KYSAR MRN: [id number]; DOB: 07/05/1933  Admit date: 05/08/2024 Date of Consult: 05/09/2024  PCP:  [name], MD   [facility] Providers Cardiologist:  Evalene Lunger, MD  Electrophysiologist:  Elspeth Sage, MD (Inactive)  Advanced Heart Failure:  Ezra Shuck, MD       Patient Profile: [name] is a 89 y.o. 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 Leotis.  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  She is being followed by Dr. Shuck of heart failure service.  Patient is also followed by hematology/oncology service for pancreatic neuroendocrine tumor and chronic blood loss anemia.  She has previously failed amiodarone , Tikosyn  and flecainide .  She had multiple cardioversions in the past.  She is self rate controlled without any AV nodal blocking agent.  Patient is felt not to be candidate for Watchman procedure due to frailty and poor decompensated heart failure.  Based on previous right heart cath in 2020, patient primarily has pulmonary venous hypertension and felt not to be a candidate for pulmonary vasodilator.  Echocardiogram in May 2025 showed EF of 60 to 65%, no regional wall motion  abnormality, moderately reduced RV systolic function, severely elevated PA systolic pressure, moderate mitral stenosis, severe TR.  Patient was admitted in September 2025 due to lower extremity ulcer with poor healing.  This was treated with antibiotic.  Culture grew MRSA and Corynebacterium.  She was discharged to SNF on torsemide  100 mg daily.  Metolazone  was not restarted on discharge.  Torsemide  was later increased to 100 mg twice daily and metolazone  started 3 times a week.  She was most recently admitted to the hospital in November 2025 due to lower extremity cellulitis treated with IV antibiotic.  Cardiology service was also consulted for management of heart failure and recommended palliative care.  During the hospitalization, patient has been met with palliative care service, she refused hospice.  Repeat echocardiogram showed EF 55 to 60%, grade 3 DD, moderately reduced RV, severely elevated PASP of 91 mmHg, moderate aortic stenosis.  She was discharged on home dose of torsemide  100 mg twice a day.  Hospital course complicated by large left lower extremity laceration, this was previously treated in the ED, however wound edges did not approximate and the patient had intermittent bleeding.  Plastic surgery service was consulted who recommended continue with wound care.  She returned to the hospital on 1//2026 due to confusion, worsening shortness of breath and concern for infection of her left lower extremity wound.  proBNP elevated at 9242.  Chest x-ray showed small bilateral pleural effusion.  X-ray of the left tibia/fibula negative for osteomyelitis.  Patient was given IV antibiotic and IV Lasix .  Cardiology service consulted to help manage heart failure.  Patient's mental status is improved since yesterday.  She is able to  converse in complete sentences.  She does state that she had trouble walking across the room over the last few days.  She has been given IV Lasix  with improvement in her symptoms.   In speaking with both her and her daughter, they are both interested in palliative care services while in the hospital.  Her daughter is somewhat uncomfortable taking the patient home as the patient needs round-the-clock care and would prefer the patient to go to assisted living.  We did discuss the possibility of hospice care, though they are somewhat hesitant as the patient is doing well with her therapy from oncology.   Past Medical History:  Diagnosis Date   (HFpEF) heart failure with preserved ejection fraction (HCC)    EF=60-60%   Actinic keratosis 01/17/2015   R forearm   Adjustment disorder with anxiety    Adverse effect of other narcotics, sequela    Intolerance to all narcotics   Anti-Duffy antibodies present    Aortic valve stenosis    Arthritis    some in my hands (11/11/2012)   Atrial fibrillation, permanent (HCC)    Eliquis    Atypical mole 03/25/2018   L forearm - severe   Automatic implantable cardioverter-defibrillator in situ    Avascular necrosis of hip (HCC) 05/03/2011   Carotid artery stenosis 09/2007   a. 09/2007: 60-79% bilateral (stable); b. 10/2008: 40-59% R 60-79%    Cellulitis of left lower extremity 07/13/2020   CKD (chronic kidney disease), stage III (HCC)    COPD (chronic obstructive pulmonary disease) (HCC)    Coronary artery disease    non-obstructive by 2006 cath   COVID-19 virus infection 02/12/2022   Displaced fracture of left femoral neck (HCC) 10/09/2018   GI bleed 03/28/2020   AVM   Gram-negative bacteremia 09/05/2023   High cholesterol    Hypertension 05/20/2011   Hypertr obst cardiomyop    Hypotension, unspecified    cardiac cath 2006..nonobstructive CAD 30-40s lesions.SABRAETT 1/09 nondiagnostic due to poor HR response..Right Renal Cancer 2003   Iron  deficiency anemia    Long term (current) use of anticoagulants    On home oxygen  therapy    3L [place]   Osteoarthritis of right hip    PAD (peripheral artery disease)    Pancreatic cancer (HCC)     sandostatin    PONV (postoperative nausea and vomiting)    Presence of permanent cardiac pacemaker    Pulmonary HTN (HCC)    RECTAL BLEEDING 10/13/2009   Qualifier: Diagnosis of  By: Kerman NP, Paula     Red blood cell antibody positive with compatible PRBC difficult to obtain    Anti FYA (Duffy a) antibody. Must be transfused with PRB which are Duffy Antigen Negative and Crossmatch Compatible   Renal cell carcinoma (HCC) 2005   s/p right nephrectomy   Squamous cell carcinoma of skin 01/17/2015   R lat wrist   Squamous cell carcinoma of skin 01/29/2018   R post upper leg - superficially invasive   Squamous cell carcinoma of skin 03/25/2018   L lat foot   Squamous cell carcinoma of skin 11/09/2020   left lat foot - EDC 01/01/21, recurrent 01/31/21 - MOHs 02/22/21   Squamous cell carcinoma of skin 12/19/2021   Right Posterior Medial Thigh, EDC   Squamous cell carcinoma of skin 12/19/2021   SCC IS, L lat heel, EDC 02/04/2022   Squamous cell carcinoma of skin 01/21/2022   L forearm, EDC 02/04/2022   Squamous cell carcinoma of skin 01/21/2022   R  lower leg below knee, EDC 02/04/2022   Squamous cell carcinoma of skin 01/21/2022   SCCIS, R post heel, EDC 02/04/2022   Squamous cell carcinoma of skin 07/01/2022   left lower abdomen, in situ, EDC   Squamous cell carcinoma of skin 07/01/2022   left medial chest, EDC   Urge incontinence    Venous stasis of both lower extremities     Past Surgical History:  Procedure Laterality Date   ABDOMINAL AORTOGRAM W/LOWER EXTREMITY N/A 02/12/2021   Procedure: ABDOMINAL AORTOGRAM W/LOWER EXTREMITY;  Surgeon: Sheree Penne Bruckner, MD;  Location: [facility] INVASIVE CV LAB;  Service: Cardiovascular;  Laterality: N/A;   ABDOMINAL HYSTERECTOMY  1975   for benign causes   APPENDECTOMY     BI-VENTRICULAR PACEMAKER UPGRADE  05/04/2010   BIOPSY  02/28/2019   Procedure: BIOPSY;  Surgeon: Teressa Toribio SQUIBB, MD;  Location: [facility] ENDOSCOPY;  Service:  Endoscopy;;   BIOPSY  03/04/2022   Procedure: BIOPSY;  Surgeon: Wilhelmenia Aloha Raddle., MD;  Location: [facility];  Service: Gastroenterology;;   CARDIAC CATHETERIZATION  2006   CARDIOVERSION N/A 02/20/2018   Procedure: CARDIOVERSION;  Surgeon: [name], MD;  Location: [facility] ORS;  Service: Cardiovascular;  Laterality: N/A;   CARDIOVERSION N/A 03/27/2018   Procedure: CARDIOVERSION;  Surgeon: [name], MD;  Location: [facility] ORS;  Service: Cardiovascular;  Laterality: N/A;   CATARACT EXTRACTION W/ INTRAOCULAR LENS  IMPLANT, BILATERAL  01/2006-02-2006   CHOLECYSTECTOMY N/A 11/11/2012   Procedure: LAPAROSCOPIC CHOLECYSTECTOMY WITH INTRAOPERATIVE CHOLANGIOGRAM;  Surgeon: Donnice POUR. Belinda, MD;  Location: [facility] OR;  Service: General;  Laterality: N/A;   COLONOSCOPY WITH PROPOFOL  N/A 02/28/2019   Procedure: COLONOSCOPY WITH PROPOFOL ;  Surgeon: Teressa Toribio SQUIBB, MD;  Location: [facility] ENDOSCOPY;  Service: Endoscopy;  Laterality: N/A;   ENTEROSCOPY N/A 03/30/2020   Procedure: ENTEROSCOPY;  Surgeon: San Sandor GAILS, DO;  Location: [facility] ENDOSCOPY;  Service: Gastroenterology;  Laterality: N/A;   ENTEROSCOPY N/A 02/10/2021   Procedure: ENTEROSCOPY;  Surgeon: Rollin Dover, MD;  Location: [facility] ENDOSCOPY;  Service: Endoscopy;  Laterality: N/A;   ENTEROSCOPY N/A 02/15/2022   Procedure: ENTEROSCOPY;  Surgeon: Federico Rosario BROCKS, MD;  Location: [facility] ENDOSCOPY;  Service: Gastroenterology;  Laterality: N/A;   ENTEROSCOPY N/A 11/06/2022   Procedure: ENTEROSCOPY;  Surgeon: Wilhelmenia Aloha Raddle., MD;  Location: [facility] ENDOSCOPY;  Service: Gastroenterology;  Laterality: N/A;   EP IMPLANTABLE DEVICE N/A 02/21/2016   Procedure: ICD Generator Changeout;  Surgeon: Elspeth BROCKS Sage, MD;  Location: [facility] INVASIVE CV LAB;  Service: Cardiovascular;  Laterality: N/A;   ESOPHAGOGASTRODUODENOSCOPY N/A 03/04/2022   Procedure: ESOPHAGOGASTRODUODENOSCOPY (EGD);  Surgeon: Wilhelmenia Aloha Raddle., MD;  Location: [facility];  Service:  Gastroenterology;  Laterality: N/A;   ESOPHAGOGASTRODUODENOSCOPY (EGD) WITH PROPOFOL  N/A 02/28/2019   Procedure: ESOPHAGOGASTRODUODENOSCOPY (EGD) WITH PROPOFOL ;  Surgeon: Teressa Toribio SQUIBB, MD;  Location: [facility] ENDOSCOPY;  Service: Endoscopy;  Laterality: N/A;   EUS N/A 03/04/2022   Procedure: UPPER ENDOSCOPIC ULTRASOUND (EUS) RADIAL;  Surgeon: Wilhelmenia Aloha Raddle., MD;  Location: [facility] ENDOSCOPY;  Service: Gastroenterology;  Laterality: N/A;   FINE NEEDLE ASPIRATION N/A 03/04/2022   Procedure: FINE NEEDLE ASPIRATION (FNA) LINEAR;  Surgeon: Wilhelmenia Aloha Raddle., MD;  Location: [facility] ENDOSCOPY;  Service: Gastroenterology;  Laterality: N/A;   GIVENS CAPSULE STUDY N/A 03/15/2019   Procedure: GIVENS CAPSULE STUDY;  Surgeon: Eda Iha, MD;  Location: [facility] ENDOSCOPY;  Service: Gastroenterology;  Laterality: N/A;   HOT HEMOSTASIS N/A 03/30/2020   Procedure: HOT HEMOSTASIS (ARGON PLASMA COAGULATION/BICAP);  Surgeon: San Sandor GAILS, DO;  Location: [facility] ENDOSCOPY;  Service: Gastroenterology;  Laterality: N/A;   HOT HEMOSTASIS N/A 02/10/2021   Procedure: HOT HEMOSTASIS (ARGON PLASMA COAGULATION/BICAP);  Surgeon: Rollin Dover, MD;  Location: [facility] ENDOSCOPY;  Service: Endoscopy;  Laterality: N/A;   HOT HEMOSTASIS N/A 02/15/2022   Procedure: HOT HEMOSTASIS (ARGON PLASMA COAGULATION/BICAP);  Surgeon: Federico Rosario BROCKS, MD;  Location: [facility] ENDOSCOPY;  Service: Gastroenterology;  Laterality: N/A;   HOT HEMOSTASIS N/A 11/06/2022   Procedure: HOT HEMOSTASIS (ARGON PLASMA COAGULATION/BICAP);  Surgeon: Wilhelmenia Aloha Raddle., MD;  Location: [facility] ENDOSCOPY;  Service: Gastroenterology;  Laterality: N/A;   INSERT / REPLACE / REMOVE PACEMAKER  05/01/2011   02-28-05-/05-04-10-ICD-MEDTRONIC MAXIMAL DR   IR ANGIOGRAM SELECTIVE EACH ADDITIONAL VESSEL  02/18/2022   IR ANGIOGRAM SELECTIVE EACH ADDITIONAL VESSEL  02/18/2022   IR ANGIOGRAM VISCERAL SELECTIVE  02/16/2022   IR ANGIOGRAM VISCERAL SELECTIVE  02/16/2022   IR EMBO ART   VEN HEMORR LYMPH EXTRAV  INC GUIDE ROADMAPPING  02/16/2022   IR THORACENTESIS ASP PLEURAL SPACE W/IMG GUIDE  02/21/2022   IR THORACENTESIS ASP PLEURAL SPACE W/IMG GUIDE  02/22/2022   IR US  GUIDE VASC ACCESS RIGHT  02/16/2022   JOINT REPLACEMENT     LAPAROSCOPIC CHOLECYSTECTOMY  11/11/2012   LAPAROSCOPIC LYSIS OF ADHESIONS N/A 11/11/2012   Procedure: LAPAROSCOPIC LYSIS OF ADHESIONS;  Surgeon: Donnice POUR. Belinda, MD;  Location: [facility] OR;  Service: General;  Laterality: N/A;   NEPHRECTOMY Right 06/2001    S/P RENAL CELL CANCER   PERIPHERAL VASCULAR INTERVENTION Bilateral 02/12/2021   Procedure: PERIPHERAL VASCULAR INTERVENTION;  Surgeon: Sheree Penne Bruckner, MD;  Location: [facility] INVASIVE CV LAB;  Service: Cardiovascular;  Laterality: Bilateral;  Iliac artery stents   PRESSURE SENSOR/CARDIOMEMS N/A 02/03/2019   Procedure: PRESSURE SENSOR/CARDIOMEMS;  Surgeon: [name], MD;  Location: [facility] INVASIVE CV LAB;  Service: Cardiovascular;  Laterality: N/A;   RIGHT HEART CATH N/A 11/09/2018   Procedure: RIGHT HEART CATH;  Surgeon: [name], MD;  Location: [facility] INVASIVE CV LAB;  Service: Cardiovascular;  Laterality: N/A;   RIGHT HEART CATH N/A 03/08/2019   Procedure: RIGHT HEART CATH;  Surgeon: [name], MD;  Location: [facility] INVASIVE CV LAB;  Service: Cardiovascular;  Laterality: N/A;   SUBMUCOSAL TATTOO INJECTION  02/28/2019   Procedure: SUBMUCOSAL TATTOO INJECTION;  Surgeon: Teressa Toribio SQUIBB, MD;  Location: [facility] ENDOSCOPY;  Service: Endoscopy;;   SUBMUCOSAL TATTOO INJECTION  11/06/2022   Procedure: SUBMUCOSAL TATTOO INJECTION;  Surgeon: Wilhelmenia Aloha Raddle., MD;  Location: [facility] ENDOSCOPY;  Service: Gastroenterology;;   TOTAL HIP ARTHROPLASTY Right 05/03/2011   Procedure: TOTAL HIP ARTHROPLASTY ANTERIOR APPROACH;  Surgeon: Bruckner CINDERELLA Poli;  Location: [facility] ORS;  Service: Orthopedics;  Laterality: Right;  Removal of Cannulated Screws Right Hip, Right Direct Anterior Hip Replacement    TOTAL HIP ARTHROPLASTY Left 10/09/2018   Procedure: TOTAL HIP ARTHROPLASTY ANTERIOR APPROACH;  Surgeon: Fidel Rogue, MD;  Location: [facility] OR;  Service: Orthopedics;  Laterality: Left;     Home Medications:  Prior to Admission medications  Medication Sig Start Date End Date Taking? Authorizing Provider  acetaminophen  (TYLENOL ) 500 MG tablet Take 1,000 mg by mouth every 8 (eight) hours.   Yes [provider]  Ascorbic Acid  (VITAMIN C  PO) Take 1 tablet by mouth daily.   Yes [provider]  Cholecalciferol  (VITAMIN D -3 PO) Take 2 capsules by mouth daily.   Yes [provider]  CRANBERRY PO Take 1 tablet by mouth daily.   Yes [provider]  Cyanocobalamin  (B-12 PO)  Take 1 tablet by mouth daily.   Yes [provider]  ferrous gluconate  (FERGON) 324 MG tablet Take 1 tablet (324 mg total) by mouth daily with breakfast. 11/15/22  Yes Rai, Ripudeep K, MD  fluticasone  (FLONASE ) 50 MCG/ACT nasal spray Place 2 sprays into both nostrils daily as needed for allergies or rhinitis. 07/04/22  Yes [name], MD  leptospermum manuka honey (MEDIHONEY) PSTE paste Apply 1 Application topically daily. 01/23/24  Yes Franchot Novel, MD  metolazone  (ZAROXOLYN ) 2.5 MG tablet Take 1 tablet (2.5 mg total) by mouth 3 (three) times a week. Every Tues., Thurs and Sat. with 40 meq of potassium 03/31/24  Yes [name], MD  Multiple Vitamins-Minerals (HAIR SKIN NAILS PO) Take 1 capsule by mouth daily.   Yes [provider]  Potassium Chloride  ER 20 MEQ TBCR Take 2 tablets (40 mEq total) by mouth daily. Take extra 2 tabs with metolazone  03/31/24  Yes McLean, Dalton S, MD  torsemide  (DEMADEX ) 20 MG tablet Take 5 tablets (100 mg total) by mouth 2 (two) times daily. 03/30/24  Yes [name], MD  traMADol  (ULTRAM ) 50 MG tablet Take 1 tablet (50 mg total) by mouth 3 (three) times daily as needed for moderate pain (pain score 4-6). 04/17/24  Yes [name], MD    Scheduled Meds:  heparin   5,000 Units Subcutaneous Q8H   [START ON 05/11/2024] metolazone   2.5 mg Oral Once per day on Tuesday Thursday Saturday   rosuvastatin   10 mg Oral Daily   Continuous Infusions:  furosemide  Stopped (05/09/24 1217)   linezolid  (ZYVOX ) IV Stopped (05/09/24 1053)   piperacillin -tazobactam (ZOSYN )  IV 3.375 g (05/09/24 1311)   PRN Meds: acetaminophen  **OR** acetaminophen , ipratropium-albuterol , traMADol   Allergies:   Allergies[1]  Social History:   Social History   Socioeconomic History   Marital status: Widowed    Spouse name: Not on file   Number of children: 2   Years of education: high school   Highest education level: Not on file  Occupational History   Occupation: Retired    Associate Professor: RETIRED  Tobacco Use   Smoking status: Former    Current packs/day: 0.00    Average packs/day: 0.5 packs/day for 40.0 years (20.0 ttl pk-yrs)    Types: Cigarettes    Start date: 05/06/1961    Quit date: 05/06/2001    Years since quitting: 23.0   Smokeless tobacco: Never  Vaping Use   Vaping status: Never Used  Substance and Sexual Activity   Alcohol use: No    Alcohol/week: 0.0 standard drinks of alcohol   Drug use: No   Sexual activity: Not Currently  Other Topics Concern   Not on file  Social History Narrative   Would desire CPR   07/11/20   From: the area   Living: with great-grandson - Norman (2003)   Work: retired - clerical      Family: 2 children - [place] and [place] - 2 grand chlidren - 5 great grand children      Enjoys: spending time with friends - watch move, eat out, spend time      Exercise: walking around the house   Diet: not good, limits red meat, limits salt, low appetite      Safety   Seat belts: Yes    Guns: Yes  and secure   Safe in relationships: Yes    Social Drivers of Health   Tobacco Use: Medium Risk (05/08/2024)   Patient History    Smoking  Tobacco Use: Former    Smokeless Tobacco Use: Never    Passive  Exposure: Not on Actuary Strain: Low Risk (11/06/2023)   Overall Financial Resource Strain (CARDIA)    Difficulty of Paying Living Expenses: Not hard at all  Food Insecurity: No Food Insecurity (04/02/2024)   Epic    Worried About Programme Researcher, Broadcasting/film/video in the Last Year: Never true    Ran Out of Food in the Last Year: Never true  Transportation Needs: No Transportation Needs (04/02/2024)   Epic    Lack of Transportation (Medical): No    Lack of Transportation (Non-Medical): No  Physical Activity: Insufficiently Active (11/06/2023)   Exercise Vital Sign    Days of Exercise per Week: 3 days    Minutes of Exercise per Session: 20 min  Stress: No Stress Concern Present (11/06/2023)   [employer] of Occupational Health - Occupational Stress Questionnaire    Feeling of Stress: Not at all  Social Connections: Socially Isolated (04/02/2024)   Social Connection and Isolation Panel    Frequency of Communication with Friends and Family: More than three times a week    Frequency of Social Gatherings with Friends and Family: More than three times a week    Attends Religious Services: Never    Database Administrator or Organizations: No    Attends Banker Meetings: Never    Marital Status: Widowed  Intimate Partner Violence: Not At Risk (04/02/2024)   Epic    Fear of Current or Ex-Partner: No    Emotionally Abused: No    Physically Abused: No    Sexually Abused: No  Depression (PHQ2-9): Low Risk (04/21/2024)   Depression (PHQ2-9)    PHQ-2 Score: 0  Alcohol Screen: Low Risk (11/06/2023)   Alcohol Screen    Last Alcohol Screening Score (AUDIT): 0  Housing: Low Risk (04/02/2024)   Epic    Unable to Pay for Housing in the Last Year: No    Number of Times Moved in the Last Year: 0    Homeless in the Last Year: No  Utilities: Not At Risk (04/02/2024)   Epic    Threatened with loss of utilities: No  Health Literacy: Adequate Health Literacy (11/06/2023)   B1300  Health Literacy    Frequency of need for help with medical instructions: Never    Family History:    Family History  Problem Relation Age of Onset   Heart failure Mother    Early death Father        car accident   Breast cancer Maternal Aunt 38   Breast cancer Cousin    Breast cancer Other    Colon cancer Neg Hx    Esophageal cancer Neg Hx    Liver disease Neg Hx    Pancreatic cancer Neg Hx    Rectal cancer Neg Hx      ROS:  Please see the history of present illness.   All other ROS reviewed and negative.     Physical Exam/Data: Vitals:   05/09/24 0713 05/09/24 0745 05/09/24 1100 05/09/24 1400  BP: (!) 145/104 118/80 (!) 153/56 119/69  Pulse: 81 76 82 77  Resp: 19 17 (!) 26 (!) 21  Temp: 98.4 F (36.9 C)  (!) 97.5 F (36.4 C)   TempSrc: Oral  Oral   SpO2: 94% 98% 92% 100%  Weight:      Height:        Intake/Output Summary (Last 24 hours) at 05/09/2024  1426 Last data filed at 05/09/2024 0500 Gross per 24 hour  Intake 390.86 ml  Output --  Net 390.86 ml      05/08/2024   10:52 PM 04/21/2024    1:17 PM 04/12/2024    4:07 AM  Last 3 Weights  Weight (lbs) 116 lb 114 lb 128 lb 8.5 oz  Weight (kg) 52.617 kg 51.71 kg 58.3 kg     Body mass index is 18.72 kg/m.  General:  Well nourished, well developed, in no acute distress HEENT: normal Neck: JVD to the angle of the jaw Vascular: No carotid bruits; Distal pulses 2+ bilaterally Cardiac: Irregularly irregular; no murmur  Lungs: Crackles at the bases Abd: soft, nontender, no hepatomegaly  Ext: 2+ edema Musculoskeletal:  No deformities, BUE and BLE strength normal and equal Skin: Chronic lower extremity wounds Neuro:  CNs 2-12 intact, no focal abnormalities noted Psych:  Normal affect   EKG:  The EKG was personally reviewed and demonstrates: Atrial fibrillation, left bundle branch block Telemetry:  Telemetry was personally reviewed and demonstrates: Atrial fibrillation  Relevant CV Studies:  Echo 04/04/2024   1. Left ventricular ejection fraction, by estimation, is 55 to 60%. The  left ventricle has normal function. The left ventricle has no regional  wall motion abnormalities. Left ventricular diastolic parameters are  consistent with Grade III diastolic  dysfunction (restrictive). Elevated left atrial pressure. There is the  interventricular septum is flattened in systole and diastole, consistent  with right ventricular pressure and volume overload.   2. Right ventricular systolic function is moderately reduced. The right  ventricular size is normal. There is severely elevated pulmonary artery  systolic pressure. The estimated right ventricular systolic pressure is  91.5 mmHg.   3. Right atrial size was moderately dilated.   4. The mitral valve is degenerative. Mild to moderate mitral valve  regurgitation. Moderate mitral stenosis. The mean mitral valve gradient is  7.0 mmHg. Moderate mitral annular calcification.   5. Tricuspid valve regurgitation is severe.   6. The aortic valve is calcified. There is severe calcifcation of the  aortic valve. There is severe thickening of the aortic valve. Aortic valve  regurgitation is mild. Moderate aortic valve stenosis. Aortic valve area,  by VTI measures 1.18 cm. Aortic  valve mean gradient measures 20.5 mmHg. Aortic valve Vmax measures 3.00  m/s.   7. The inferior vena cava is dilated in size with >50% respiratory  variability, suggesting right atrial pressure of 8 mmHg.   8. A small pericardial effusion is present. The pericardial effusion is  circumferential. Moderate pleural effusion in the left lateral region.   Laboratory Data: High Sensitivity Troponin:  No results for input(s): TROPONINIHS in the last 720 hours.   Chemistry Recent Labs  Lab 05/09/24 0025 05/09/24 0136  NA 140 140  139  K 3.6 3.5  3.5  CL 103 104  CO2 23  --   GLUCOSE 106* 98  BUN 69* 70*  CREATININE 1.39* 1.50*  CALCIUM  9.3  --   GFRNONAA 36*  --   ANIONGAP  15  --     Recent Labs  Lab 05/09/24 0025  PROT 6.8  ALBUMIN  3.7  AST 36  ALT 12  ALKPHOS 108  BILITOT 0.7   Lipids No results for input(s): CHOL, TRIG, HDL, LABVLDL, LDLCALC, CHOLHDL in the last 168 hours.  Hematology Recent Labs  Lab 05/09/24 0025 05/09/24 0136  WBC 7.2  --   RBC 2.82*  --   HGB 7.8*  9.5*  9.5*  HCT 26.0* 28.0*  28.0*  MCV 92.2  --   MCH 27.7  --   MCHC 30.0  --   RDW 19.7*  --   PLT 222  --    Thyroid   Recent Labs  Lab 05/09/24 0855  TSH 2.490    BNP Recent Labs  Lab 05/09/24 0025  PROBNP 9,242.0*    DDimer No results for input(s): DDIMER in the last 168 hours.  Radiology/Studies:  CT Head Wo Contrast Result Date: 05/09/2024 EXAM: CT HEAD WITHOUT 05/09/2024 02:45:25 AM TECHNIQUE: CT of the head was performed without the administration of intravenous contrast. Automated exposure control, iterative reconstruction, and/or weight based adjustment of the mA/kV was utilized to reduce the radiation dose to as low as reasonably achievable. COMPARISON: 03/28/2020 CLINICAL HISTORY: Head trauma, minor (Age >= 65y); AMS FINDINGS: BRAIN AND VENTRICLES: No acute intracranial hemorrhage. No mass effect or midline shift. No extra-axial fluid collection. No evidence of acute infarct. No hydrocephalus. Atrophy and chronic small vessel disease throughout the deep white matter. ORBITS: No acute abnormality. SINUSES AND MASTOIDS: No acute abnormality. SOFT TISSUES AND SKULL: No acute skull fracture. No acute soft tissue abnormality. IMPRESSION: 1. No acute intracranial abnormality. 2. Atrophy and chronic small vessel disease throughout the deep white matter. Electronically signed by: Franky Crease MD 05/09/2024 02:49 AM EST RP Workstation: [id number]   DG Tibia/Fibula Left Result Date: 05/09/2024 EXAM: _VIEWS_ VIEW(S) XRAY OF THE LEFT TIBIA AND FIBULA 05/09/2024 12:37:14 AM COMPARISON: None available. CLINICAL HISTORY: wound wound FINDINGS: BONES AND JOINTS:  No fracture, subluxation, or dislocation. No bone destruction to suggest osteomyelitis. Diffuse osteopenia. No malalignment. SOFT TISSUES: Large soft tissue defect anteriorly within the distal left calf. IMPRESSION: 1. Large soft tissue defect anteriorly within the distal left calf. 2. No bone destruction to suggest osteomyelitis. Electronically signed by: Franky Crease MD 05/09/2024 12:43 AM EST RP Workstation: [id number]   DG Chest Port 1 View Result Date: 05/09/2024 EXAM: 1 VIEW(S) XRAY OF THE CHEST 05/09/2024 12:36:44 AM COMPARISON: 04/10/2024. CLINICAL HISTORY: Questionable sepsis - evaluate for abnormality FINDINGS: LINES, TUBES AND DEVICES: Left AICD remains in place, unchanged. LUNGS AND PLEURA: Small bilateral pleural effusions. No focal pulmonary opacity. No pneumothorax. HEART AND MEDIASTINUM: Cardiomegaly, aortic atherosclerosis. BONES AND SOFT TISSUES: No acute osseous abnormality. IMPRESSION: 1. Small bilateral pleural effusions. 2. Cardiomegaly. Electronically signed by: Franky Crease MD 05/09/2024 12:41 AM EST RP Workstation: [id number]     Assessment and Plan: Acute on chronic diastolic heart failure: Has grade 3 diastolic dysfunction on prior echo with RV failure.  Had previously recommended palliative care, but the patient has refused.  In prior hospitalization, the patient wanted to go to a nursing facility, but there were issues with insurance.  She is significantly volume overloaded with JVD, lower extremity edema.  She has mild crackles at the bases of her lungs.  She would likely benefit from continued diuresis.  I think that consultation with palliative care would be a reasonable option.  I discussed this with the family who has agreed.  For now, we Secret Kristensen continue current diuresis.   2.  Chronic lower extremity wounds: Managed by primary team  3.  Valvular heart disease: mild to moderate MR and mitral stenosis.  Not a candidate for mitral valve intervention due to other chronic medical  issues  4.  Pulmonary hypertension: Not a candidate for pulmonary vasodilators  5.  Permanent atrial fibrillation: Not anticoagulated due to history of GI bleed  6.  CKD  stage IV  7.  Pancreatic neuroendocrine tumor  8.  Chronic respiratory failure with COPD: Evidence of RV failure on echo   Risk Assessment/Risk Scores:       New [place]  Heart Association (NYHA) Functional Class NYHA Class IV  CHA2DS2-VASc Score = 6   This indicates a 9.7% annual risk of stroke. The patient's score is based upon: CHF History: 1 HTN History: 0 Diabetes History: 0 Stroke History: 2 Vascular Disease History: 0 Age Score: 2 Gender Score: 1        For questions or updates, please contact [facility] Please consult [email or web address] for contact info under        [1]  Allergies Allergen Reactions   Codeine Nausea And Vomiting and Other (See Comments)    Hallucinations, too- CANNOT HAVE; thinks she may have received Narcan  to reverse the effect   Dilaudid  [Hydromorphone ] Other (See Comments)    Excessive Somnolence- Required Narcan  X2   Tape Other (See Comments)    CAN TOLERATE ONLY EASY-RELEASE, PAPER TAPE, AS THE SKIN IS VERY THIN AND Ishita Mcnerney BRUISE AND TEAR VERY EASILY   Amoxil  [Amoxicillin ] Nausea Only   Asa [Aspirin ] Other (See Comments)    Told to avoid due to reduced kidney function, has 1 remaining kidney   Ms Contin  [Morphine ] Nausea And Vomiting and Other (See Comments)    Hallucinations, also   Neurontin  [Gabapentin ] Other (See Comments)    Out of it   Nsaids Other (See Comments)    Told to avoid due to reduced kidney function, has 1 remaining kidney   "

## 2024-05-09 NOTE — ED Notes (Signed)
 IV team at bedside as IV was infiltrated when this paramedic attempted to flush it to start antibiotics.

## 2024-05-09 NOTE — ED Notes (Signed)
 Phlebotomy stuck patient twice unable to collect second set of cultures. RN aware

## 2024-05-09 NOTE — ED Notes (Signed)
 MD messaged regarding patient request for tramadol 

## 2024-05-09 NOTE — ED Provider Notes (Signed)
 "  EMERGENCY DEPARTMENT AT [facility] Provider Note   CSN: [id number] Arrival date & time: 05/08/24  2238     Patient presents with: Leg Swelling   [name] is a 89 y.o. female.   The history is provided by the patient, a relative and medical records.  [name] is a 89 y.o. female who presents to the Emergency Department complaining of multiple complaints.  She presents to the emergency department by EMS for evaluation of bilateral leg pain, acute on chronic shortness of breath.  She is on 2 to 3 L nasal cannula at baseline and reports that she is more short of breath than usual, feels lethargic and her legs are hurting more.  Additional history obtained by patient's daughter over the phone after patient's initial assessment.  Daughter reports that patient has been severely confused today and was mildly confused yesterday.  Family is unsure if they are fevers. Patient states that she is DNR, DNI and family confirmed this.  She does want interventions for symptom management and treatment for illnesses.    Prior to Admission medications  Medication Sig Start Date End Date Taking? Authorizing Provider  acetaminophen  (TYLENOL ) 500 MG tablet Take 1,000 mg by mouth every 8 (eight) hours.    [provider]  Ascorbic Acid  (VITAMIN C  PO) Take 1 tablet by mouth daily.    [provider]  Cholecalciferol  (VITAMIN D -3 PO) Take 2 capsules by mouth daily.    [provider]  CRANBERRY PO Take 1 tablet by mouth daily.    [provider]  Cyanocobalamin  (B-12 PO) Take 1 tablet by mouth daily.    [provider]  ferrous gluconate  (FERGON) 324 MG tablet Take 1 tablet (324 mg total) by mouth daily with breakfast. 11/15/22   Rai, Nydia POUR, MD  fluticasone  (FLONASE ) 50 MCG/ACT nasal spray Place 2 sprays into both nostrils daily as needed for allergies or rhinitis. 07/04/22   [name], MD  leptospermum manuka honey  (MEDIHONEY) PSTE paste Apply 1 Application topically daily. 01/23/24   Franchot Novel, MD  metolazone  (ZAROXOLYN ) 2.5 MG tablet Take 1 tablet (2.5 mg total) by mouth 3 (three) times a week. Every Tues., Thurs and Sat. with 40 meq of potassium 03/31/24   McLean, Dalton S, MD  Multiple Vitamins-Minerals (HAIR SKIN NAILS PO) Take 1 capsule by mouth daily.    [provider]  polyethylene glycol (MIRALAX  / GLYCOLAX ) 17 g packet Take 17 g by mouth daily. 01/27/24   Franchot Novel, MD  Potassium Chloride  ER 20 MEQ TBCR Take 2 tablets (40 mEq total) by mouth daily. Take extra 2 tabs with metolazone  03/31/24   McLean, Dalton S, MD  rosuvastatin  (CRESTOR ) 10 MG tablet Take 1 tablet (10 mg total) by mouth daily. 06/20/23   Maree Hue, MD  torsemide  (DEMADEX ) 20 MG tablet Take 5 tablets (100 mg total) by mouth 2 (two) times daily. 03/30/24   [name], MD  traMADol  (ULTRAM ) 50 MG tablet Take 1 tablet (50 mg total) by mouth 3 (three) times daily as needed for moderate pain (pain score 4-6). 04/17/24   [name], MD    Allergies: Codeine, Dilaudid  Aida.aloe ], Tape, Amoxil  [amoxicillin ], Asa [aspirin ], Ms contin  [morphine ], Neurontin  [gabapentin ], and Nsaids    Review of Systems  All other systems reviewed and are negative.   Updated Vital Signs BP 138/76   Pulse 74   Temp 97.7 F (36.5 C) (Oral)   Resp (!) 26  Ht 5' 6 (1.676 m)   Wt 52.6 kg   SpO2 100%   BMI 18.72 kg/m   Physical Exam Vitals and nursing note reviewed.  Constitutional:      General: She is in acute distress.     Appearance: She is well-developed. She is ill-appearing.  HENT:     Head: Normocephalic and atraumatic.  Cardiovascular:     Rate and Rhythm: Normal rate and regular rhythm.     Comments: Crackles in bilateral bases Pulmonary:     Effort: Pulmonary effort is normal. No respiratory distress.     Breath sounds: Normal breath sounds.  Abdominal:     Palpations: Abdomen is soft.      Tenderness: There is no abdominal tenderness. There is no guarding or rebound.  Musculoskeletal:        General: No tenderness.     Comments: 2+ DP pulses bilaterally.  There is edema to bilateral lower extremities with chronic venous stasis changes.  There is a wound to the anterior left tibia with some necrotic tissue present.  There is a second wound with fibrinous exudate.  Wounds are foul-smelling.  Skin:    General: Skin is warm and dry.  Neurological:     Mental Status: She is alert and oriented to person, place, and time.  Psychiatric:        Behavior: Behavior normal.     (all labs ordered are listed, but only abnormal results are displayed) Labs Reviewed  COMPREHENSIVE METABOLIC PANEL WITH GFR - Abnormal; Notable for the following components:      Result Value   Glucose, Bld 106 (*)    BUN 69 (*)    Creatinine, Ser 1.39 (*)    GFR, Estimated 36 (*)    All other components within normal limits  CBC WITH DIFFERENTIAL/PLATELET - Abnormal; Notable for the following components:   RBC 2.82 (*)    Hemoglobin 7.8 (*)    HCT 26.0 (*)    RDW 19.7 (*)    Lymphs Abs 0.5 (*)    All other components within normal limits  PROTIME-INR - Abnormal; Notable for the following components:   Prothrombin Time 16.3 (*)    All other components within normal limits  PRO BRAIN NATRIURETIC PEPTIDE - Abnormal; Notable for the following components:   Pro Brain Natriuretic Peptide 9,242.0 (*)    All other components within normal limits  URINALYSIS, W/ REFLEX TO CULTURE (INFECTION SUSPECTED) - Abnormal; Notable for the following components:   APPearance HAZY (*)    Leukocytes,Ua SMALL (*)    All other components within normal limits  I-STAT CG4 LACTIC ACID, ED - Abnormal; Notable for the following components:   Lactic Acid, Venous 2.6 (*)    All other components within normal limits  I-STAT CHEM 8, ED - Abnormal; Notable for the following components:   BUN 70 (*)    Creatinine, Ser 1.50 (*)     Calcium , Ion 0.98 (*)    Hemoglobin 9.5 (*)    HCT 28.0 (*)    All other components within normal limits  I-STAT VENOUS BLOOD GAS, ED - Abnormal; Notable for the following components:   pH, Ven 7.510 (*)    pCO2, Ven 28.5 (*)    pO2, Ven 30 (*)    Calcium , Ion 0.99 (*)    HCT 28.0 (*)    Hemoglobin 9.5 (*)    All other components within normal limits  CULTURE, BLOOD (ROUTINE X 2)  CULTURE, BLOOD (ROUTINE  X 2)  TSH  VITAMIN B12  AMMONIA  BASIC METABOLIC PANEL WITH GFR  I-STAT CG4 LACTIC ACID, ED    EKG: EKG Interpretation Date/Time:  Sunday May 09 2024 00:20:03 EST Ventricular Rate:  78 PR Interval:    QRS Duration:  139 QT Interval:  424 QTC Calculation: 483 R Axis:   264  Text Interpretation: Atrial fibrillation Ventricular premature complex Nonspecific IVCD with LAD Probable lateral infarct, age indeterminate Anteroseptal infarct, old Confirmed by Griselda Norris [phone]) on 05/09/2024 12:47:30 AM  Radiology: CT Head Wo Contrast Result Date: 05/09/2024 EXAM: CT HEAD WITHOUT 05/09/2024 02:45:25 AM TECHNIQUE: CT of the head was performed without the administration of intravenous contrast. Automated exposure control, iterative reconstruction, and/or weight based adjustment of the mA/kV was utilized to reduce the radiation dose to as low as reasonably achievable. COMPARISON: 03/28/2020 CLINICAL HISTORY: Head trauma, minor (Age >= 65y); AMS FINDINGS: BRAIN AND VENTRICLES: No acute intracranial hemorrhage. No mass effect or midline shift. No extra-axial fluid collection. No evidence of acute infarct. No hydrocephalus. Atrophy and chronic small vessel disease throughout the deep white matter. ORBITS: No acute abnormality. SINUSES AND MASTOIDS: No acute abnormality. SOFT TISSUES AND SKULL: No acute skull fracture. No acute soft tissue abnormality. IMPRESSION: 1. No acute intracranial abnormality. 2. Atrophy and chronic small vessel disease throughout the deep white matter.  Electronically signed by: Franky Crease MD 05/09/2024 02:49 AM EST RP Workstation: [id number]   DG Tibia/Fibula Left Result Date: 05/09/2024 EXAM: _VIEWS_ VIEW(S) XRAY OF THE LEFT TIBIA AND FIBULA 05/09/2024 12:37:14 AM COMPARISON: None available. CLINICAL HISTORY: wound wound FINDINGS: BONES AND JOINTS: No fracture, subluxation, or dislocation. No bone destruction to suggest osteomyelitis. Diffuse osteopenia. No malalignment. SOFT TISSUES: Large soft tissue defect anteriorly within the distal left calf. IMPRESSION: 1. Large soft tissue defect anteriorly within the distal left calf. 2. No bone destruction to suggest osteomyelitis. Electronically signed by: Franky Crease MD 05/09/2024 12:43 AM EST RP Workstation: [id number]   DG Chest Port 1 View Result Date: 05/09/2024 EXAM: 1 VIEW(S) XRAY OF THE CHEST 05/09/2024 12:36:44 AM COMPARISON: 04/10/2024. CLINICAL HISTORY: Questionable sepsis - evaluate for abnormality FINDINGS: LINES, TUBES AND DEVICES: Left AICD remains in place, unchanged. LUNGS AND PLEURA: Small bilateral pleural effusions. No focal pulmonary opacity. No pneumothorax. HEART AND MEDIASTINUM: Cardiomegaly, aortic atherosclerosis. BONES AND SOFT TISSUES: No acute osseous abnormality. IMPRESSION: 1. Small bilateral pleural effusions. 2. Cardiomegaly. Electronically signed by: Franky Crease MD 05/09/2024 12:41 AM EST RP Workstation: [id number]     Procedures   Medications Ordered in the ED  metolazone  (ZAROXOLYN ) tablet 2.5 mg (has no administration in time range)  rosuvastatin  (CRESTOR ) tablet 10 mg (has no administration in time range)  heparin  injection 5,000 Units (has no administration in time range)  acetaminophen  (TYLENOL ) tablet 650 mg (has no administration in time range)    Or  acetaminophen  (TYLENOL ) suppository 650 mg (has no administration in time range)  furosemide  (LASIX ) 100 mg in dextrose  5 % 50 mL IVPB (has no administration in time range)  linezolid  (ZYVOX ) IVPB 600 mg (has no  administration in time range)  ipratropium-albuterol  (DUONEB) 0.5-2.5 (3) MG/3ML nebulizer solution 3 mL (has no administration in time range)  piperacillin -tazobactam (ZOSYN ) IVPB 3.375 g (has no administration in time range)  piperacillin -tazobactam (ZOSYN ) IVPB 3.375 g (0 g Intravenous Stopped 05/09/24 0108)  linezolid  (ZYVOX ) IVPB 600 mg (0 mg Intravenous Stopped 05/09/24 0500)  furosemide  (LASIX ) 100 mg in dextrose  5 % 50 mL IVPB (0 mg Intravenous Stopped 05/09/24  9675)                                    Medical Decision Making Amount and/or Complexity of Data Reviewed Labs: ordered. Radiology: ordered.  Risk Prescription drug management. Decision regarding hospitalization.   Patient with history of CHF, COPD, CKD, permanent A-fib here for worsening mental status, worsening shortness of breath as well as worsening wounds to legs with leg swelling.  She is ill-appearing on evaluation with tachypnea, crackles in the bases, edematous lower extremities with a wound to the left leg that does have some necrotic component.  There is no evidence of necrotizing soft tissue infection at this time.  Concern for progressive CHF as well as cellulitis and poor wound healing to the left lower extremity.  She was treated with diuretic as well as antibiotics.  Discussed goals of care.  Patient does want antibiotics and treatment for her illnesses.  She does request to be DNR/DNI and is not interested in hospice or palliative care at this time.  She is very symptomatic from her illness.  Plan to admit for ongoing care.  Hospitalist consulted for admission. Lactic acid is elevated, patient is volume overloaded and needs diuresis at this time, therefore IV fluids were withheld.    Final diagnoses:  Cellulitis of left lower extremity  Acute congestive heart failure, unspecified heart failure type [facility])    ED Discharge Orders     None          Griselda Norris, MD 05/09/24 [phone]  "

## 2024-05-09 NOTE — H&P (Signed)
 " History and Physical    [name] FMW:[id number] DOB: 1934/04/01 DOA: 05/08/2024  PCP: [name], MD  Patient coming from: Home  Chief Complaint: Confusion, shortness of breath, left leg infection  HPI: [name] is a 89 y.o. female with medical history significant of HFpEF, tricuspid regurgitation, moderate mitral and aortic stenosis, pulmonary hypertension, RV failure, ICD, permanent A-fib failed antiarrhythmics in the past and had multiple cardioversions and not on anticoagulation due to history of recurrent GI bleed, CKD stage IIIb, COPD, chronic hypoxemic respiratory failure on 2 to 3 L home oxygen , CAD, hypertension, hyperlipidemia, chronic anemia due to small bowel AVMs, PAD, primary pancreatic neuroendocrine tumor, adjustment disorder with anxiety, chronic leg wounds.  Patient was admitted to the hospital a month ago for decompensated heart failure and left lower extremity cellulitis the setting of large wound.  Cardiology had recommended consulting palliative care.  Palliative care meeting was completed and patient refused hospice.  Prior to this admission, patient had a fall resulting in a large left lower leg laceration which was sutured in the ED on 11/28 but wound edges not approximated and had intermittent bleeding.  She was treated with 6 days of antibiotics during her hospitalization and was also evaluated by plastic surgery and they recommended continuing wound care.  Patient presents to the ED tonight for evaluation of confusion, worsening shortness of breath, and concern for infection of her left lower extremity wound.  Mildly tachypneic but vital signs otherwise stable.  Saturating well on 4 L [place].  Labs showing no leukocytosis, hemoglobin 7.8 (baseline 9-10), creatinine 1.39 (at baseline), blood cultures in process, proBNP 9242, UA not suggestive of infection, lactic acid 2.6, VBG showing pH 7.51 and pCO2 28.5.  Chest x-ray showing small bilateral pleural  effusions.  X-ray of left tibia/fibula negative for osteomyelitis.  CT head showing no acute intracranial abnormality.  Patient was given IV Lasix  100 mg, linezolid , and Zosyn .  EDP Dr. Griselda spoke to the patient's daughter and confirmed that her CODE STATUS is DNR/DNI.  TRH called to admit.  Patient is very confused and not able to give any history.  Oriented to self only.  Review of Systems:  Review of Systems  All other systems reviewed and are negative.   Past Medical History:  Diagnosis Date   (HFpEF) heart failure with preserved ejection fraction (HCC)    EF=60-60%   Actinic keratosis 01/17/2015   R forearm   Adjustment disorder with anxiety    Adverse effect of other narcotics, sequela    Intolerance to all narcotics   Anti-Duffy antibodies present    Aortic valve stenosis    Arthritis    some in my hands (11/11/2012)   Atrial fibrillation, permanent (HCC)    Eliquis    Atypical mole 03/25/2018   L forearm - severe   Automatic implantable cardioverter-defibrillator in situ    Avascular necrosis of hip (HCC) 05/03/2011   Carotid artery stenosis 09/2007   a. 09/2007: 60-79% bilateral (stable); b. 10/2008: 40-59% R 60-79%    Cellulitis of left lower extremity 07/13/2020   CKD (chronic kidney disease), stage III (HCC)    COPD (chronic obstructive pulmonary disease) (HCC)    Coronary artery disease    non-obstructive by 2006 cath   COVID-19 virus infection 02/12/2022   Displaced fracture of left femoral neck (HCC) 10/09/2018   GI bleed 03/28/2020   AVM   Gram-negative bacteremia 09/05/2023   High cholesterol    Hypertension 05/20/2011   Hypertr obst  cardiomyop    Hypotension, unspecified    cardiac cath 2006..nonobstructive CAD 30-40s lesions.SABRAETT 1/09 nondiagnostic due to poor HR response..Right Renal Cancer 2003   Iron  deficiency anemia    Long term (current) use of anticoagulants    On home oxygen  therapy    3L [place]   Osteoarthritis of right hip    PAD (peripheral  artery disease)    Pancreatic cancer (HCC)    sandostatin    PONV (postoperative nausea and vomiting)    Presence of permanent cardiac pacemaker    Pulmonary HTN (HCC)    RECTAL BLEEDING 10/13/2009   Qualifier: Diagnosis of  By: [place] NP, Paula     Red blood cell antibody positive with compatible PRBC difficult to obtain    Anti FYA (Duffy a) antibody. Must be transfused with PRB which are Duffy Antigen Negative and Crossmatch Compatible   Renal cell carcinoma (HCC) 2005   s/p right nephrectomy   Squamous cell carcinoma of skin 01/17/2015   R lat wrist   Squamous cell carcinoma of skin 01/29/2018   R post upper leg - superficially invasive   Squamous cell carcinoma of skin 03/25/2018   L lat foot   Squamous cell carcinoma of skin 11/09/2020   left lat foot - EDC 01/01/21, recurrent 01/31/21 - MOHs 02/22/21   Squamous cell carcinoma of skin 12/19/2021   Right Posterior Medial Thigh, EDC   Squamous cell carcinoma of skin 12/19/2021   SCC IS, L lat heel, EDC 02/04/2022   Squamous cell carcinoma of skin 01/21/2022   L forearm, EDC 02/04/2022   Squamous cell carcinoma of skin 01/21/2022   R lower leg below knee, EDC 02/04/2022   Squamous cell carcinoma of skin 01/21/2022   SCCIS, R post heel, EDC 02/04/2022   Squamous cell carcinoma of skin 07/01/2022   left lower abdomen, in situ, EDC   Squamous cell carcinoma of skin 07/01/2022   left medial chest, EDC   Urge incontinence    Venous stasis of both lower extremities     Past Surgical History:  Procedure Laterality Date   ABDOMINAL AORTOGRAM W/LOWER EXTREMITY N/A 02/12/2021   Procedure: ABDOMINAL AORTOGRAM W/LOWER EXTREMITY;  Surgeon: Sheree Penne Bruckner, MD;  Location: [facility] INVASIVE CV LAB;  Service: Cardiovascular;  Laterality: N/A;   ABDOMINAL HYSTERECTOMY  1975   for benign causes   APPENDECTOMY     BI-VENTRICULAR PACEMAKER UPGRADE  05/04/2010   BIOPSY  02/28/2019   Procedure: BIOPSY;  Surgeon: Teressa Toribio SQUIBB, MD;   Location: [facility] ENDOSCOPY;  Service: Endoscopy;;   BIOPSY  03/04/2022   Procedure: BIOPSY;  Surgeon: Wilhelmenia Aloha Raddle., MD;  Location: [facility];  Service: Gastroenterology;;   CARDIAC CATHETERIZATION  2006   CARDIOVERSION N/A 02/20/2018   Procedure: CARDIOVERSION;  Surgeon: [name], MD;  Location: [facility] ORS;  Service: Cardiovascular;  Laterality: N/A;   CARDIOVERSION N/A 03/27/2018   Procedure: CARDIOVERSION;  Surgeon: [name], MD;  Location: [facility] ORS;  Service: Cardiovascular;  Laterality: N/A;   CATARACT EXTRACTION W/ INTRAOCULAR LENS  IMPLANT, BILATERAL  01/2006-02-2006   CHOLECYSTECTOMY N/A 11/11/2012   Procedure: LAPAROSCOPIC CHOLECYSTECTOMY WITH INTRAOPERATIVE CHOLANGIOGRAM;  Surgeon: Donnice POUR. Belinda, MD;  Location: [facility] OR;  Service: General;  Laterality: N/A;   COLONOSCOPY WITH PROPOFOL  N/A 02/28/2019   Procedure: COLONOSCOPY WITH PROPOFOL ;  Surgeon: Teressa Toribio SQUIBB, MD;  Location: [facility] ENDOSCOPY;  Service: Endoscopy;  Laterality: N/A;   ENTEROSCOPY N/A 03/30/2020   Procedure: ENTEROSCOPY;  Surgeon: San Sandor GAILS, DO;  Location: [facility] ENDOSCOPY;  Service: Gastroenterology;  Laterality: N/A;   ENTEROSCOPY N/A 02/10/2021   Procedure: ENTEROSCOPY;  Surgeon: Rollin Dover, MD;  Location: [facility] ENDOSCOPY;  Service: Endoscopy;  Laterality: N/A;   ENTEROSCOPY N/A 02/15/2022   Procedure: ENTEROSCOPY;  Surgeon: Federico Rosario BROCKS, MD;  Location: [facility] ENDOSCOPY;  Service: Gastroenterology;  Laterality: N/A;   ENTEROSCOPY N/A 11/06/2022   Procedure: ENTEROSCOPY;  Surgeon: Wilhelmenia Aloha Raddle., MD;  Location: [facility] ENDOSCOPY;  Service: Gastroenterology;  Laterality: N/A;   EP IMPLANTABLE DEVICE N/A 02/21/2016   Procedure: ICD Generator Changeout;  Surgeon: Elspeth BROCKS Sage, MD;  Location: [facility] INVASIVE CV LAB;  Service: Cardiovascular;  Laterality: N/A;   ESOPHAGOGASTRODUODENOSCOPY N/A 03/04/2022   Procedure: ESOPHAGOGASTRODUODENOSCOPY (EGD);  Surgeon: Wilhelmenia Aloha Raddle., MD;   Location: [facility];  Service: Gastroenterology;  Laterality: N/A;   ESOPHAGOGASTRODUODENOSCOPY (EGD) WITH PROPOFOL  N/A 02/28/2019   Procedure: ESOPHAGOGASTRODUODENOSCOPY (EGD) WITH PROPOFOL ;  Surgeon: Teressa Toribio SQUIBB, MD;  Location: [facility] ENDOSCOPY;  Service: Endoscopy;  Laterality: N/A;   EUS N/A 03/04/2022   Procedure: UPPER ENDOSCOPIC ULTRASOUND (EUS) RADIAL;  Surgeon: Wilhelmenia Aloha Raddle., MD;  Location: [facility] ENDOSCOPY;  Service: Gastroenterology;  Laterality: N/A;   FINE NEEDLE ASPIRATION N/A 03/04/2022   Procedure: FINE NEEDLE ASPIRATION (FNA) LINEAR;  Surgeon: Wilhelmenia Aloha Raddle., MD;  Location: [facility] ENDOSCOPY;  Service: Gastroenterology;  Laterality: N/A;   GIVENS CAPSULE STUDY N/A 03/15/2019   Procedure: GIVENS CAPSULE STUDY;  Surgeon: Eda Iha, MD;  Location: [facility] ENDOSCOPY;  Service: Gastroenterology;  Laterality: N/A;   HOT HEMOSTASIS N/A 03/30/2020   Procedure: HOT HEMOSTASIS (ARGON PLASMA COAGULATION/BICAP);  Surgeon: San Sandor GAILS, DO;  Location: [facility] ENDOSCOPY;  Service: Gastroenterology;  Laterality: N/A;   HOT HEMOSTASIS N/A 02/10/2021   Procedure: HOT HEMOSTASIS (ARGON PLASMA COAGULATION/BICAP);  Surgeon: Rollin Dover, MD;  Location: [facility] ENDOSCOPY;  Service: Endoscopy;  Laterality: N/A;   HOT HEMOSTASIS N/A 02/15/2022   Procedure: HOT HEMOSTASIS (ARGON PLASMA COAGULATION/BICAP);  Surgeon: Federico Rosario BROCKS, MD;  Location: [facility] ENDOSCOPY;  Service: Gastroenterology;  Laterality: N/A;   HOT HEMOSTASIS N/A 11/06/2022   Procedure: HOT HEMOSTASIS (ARGON PLASMA COAGULATION/BICAP);  Surgeon: Wilhelmenia Aloha Raddle., MD;  Location: [facility] ENDOSCOPY;  Service: Gastroenterology;  Laterality: N/A;   INSERT / REPLACE / REMOVE PACEMAKER  05/01/2011   02-28-05-/05-04-10-ICD-MEDTRONIC MAXIMAL DR   IR ANGIOGRAM SELECTIVE EACH ADDITIONAL VESSEL  02/18/2022   IR ANGIOGRAM SELECTIVE EACH ADDITIONAL VESSEL  02/18/2022   IR ANGIOGRAM VISCERAL SELECTIVE  02/16/2022   IR ANGIOGRAM VISCERAL  SELECTIVE  02/16/2022   IR EMBO ART  VEN HEMORR LYMPH EXTRAV  INC GUIDE ROADMAPPING  02/16/2022   IR THORACENTESIS ASP PLEURAL SPACE W/IMG GUIDE  02/21/2022   IR THORACENTESIS ASP PLEURAL SPACE W/IMG GUIDE  02/22/2022   IR US  GUIDE VASC ACCESS RIGHT  02/16/2022   JOINT REPLACEMENT     LAPAROSCOPIC CHOLECYSTECTOMY  11/11/2012   LAPAROSCOPIC LYSIS OF ADHESIONS N/A 11/11/2012   Procedure: LAPAROSCOPIC LYSIS OF ADHESIONS;  Surgeon: Donnice POUR. Belinda, MD;  Location: [facility] OR;  Service: General;  Laterality: N/A;   NEPHRECTOMY Right 06/2001    S/P RENAL CELL CANCER   PERIPHERAL VASCULAR INTERVENTION Bilateral 02/12/2021   Procedure: PERIPHERAL VASCULAR INTERVENTION;  Surgeon: Sheree Penne Bruckner, MD;  Location: [facility] INVASIVE CV LAB;  Service: Cardiovascular;  Laterality: Bilateral;  Iliac artery stents   PRESSURE SENSOR/CARDIOMEMS N/A 02/03/2019   Procedure: PRESSURE SENSOR/CARDIOMEMS;  Surgeon: [name], MD;  Location: [facility] INVASIVE CV LAB;  Service: Cardiovascular;  Laterality: N/A;  RIGHT HEART CATH N/A 11/09/2018   Procedure: RIGHT HEART CATH;  Surgeon: [name], MD;  Location: [facility] INVASIVE CV LAB;  Service: Cardiovascular;  Laterality: N/A;   RIGHT HEART CATH N/A 03/08/2019   Procedure: RIGHT HEART CATH;  Surgeon: [name], MD;  Location: [facility] INVASIVE CV LAB;  Service: Cardiovascular;  Laterality: N/A;   SUBMUCOSAL TATTOO INJECTION  02/28/2019   Procedure: SUBMUCOSAL TATTOO INJECTION;  Surgeon: Teressa Toribio SQUIBB, MD;  Location: [facility] ENDOSCOPY;  Service: Endoscopy;;   SUBMUCOSAL TATTOO INJECTION  11/06/2022   Procedure: SUBMUCOSAL TATTOO INJECTION;  Surgeon: Wilhelmenia Aloha Raddle., MD;  Location: [facility] ENDOSCOPY;  Service: Gastroenterology;;   TOTAL HIP ARTHROPLASTY Right 05/03/2011   Procedure: TOTAL HIP ARTHROPLASTY ANTERIOR APPROACH;  Surgeon: Lonni CINDERELLA Poli;  Location: [facility] ORS;  Service: Orthopedics;  Laterality: Right;  Removal of Cannulated Screws Right Hip, Right  Direct Anterior Hip Replacement   TOTAL HIP ARTHROPLASTY Left 10/09/2018   Procedure: TOTAL HIP ARTHROPLASTY ANTERIOR APPROACH;  Surgeon: Fidel Rogue, MD;  Location: [facility] OR;  Service: Orthopedics;  Laterality: Left;     reports that she quit smoking about 23 years ago. Her smoking use included cigarettes. She started smoking about 63 years ago. She has a 20 pack-year smoking history. She has never used smokeless tobacco. She reports that she does not drink alcohol and does not use drugs.  Allergies[1]  Family History  Problem Relation Age of Onset   Heart failure Mother    Early death Father        car accident   Breast cancer Maternal Aunt 72   Breast cancer Cousin    Breast cancer Other    Colon cancer Neg Hx    Esophageal cancer Neg Hx    Liver disease Neg Hx    Pancreatic cancer Neg Hx    Rectal cancer Neg Hx     Prior to Admission medications  Medication Sig Start Date End Date Taking? Authorizing Provider  acetaminophen  (TYLENOL ) 500 MG tablet Take 1,000 mg by mouth every 8 (eight) hours.    [provider]  Ascorbic Acid  (VITAMIN C  PO) Take 1 tablet by mouth daily.    [provider]  Cholecalciferol  (VITAMIN D -3 PO) Take 2 capsules by mouth daily.    [provider]  CRANBERRY PO Take 1 tablet by mouth daily.    [provider]  Cyanocobalamin  (B-12 PO) Take 1 tablet by mouth daily.    [provider]  ferrous gluconate  (FERGON) 324 MG tablet Take 1 tablet (324 mg total) by mouth daily with breakfast. 11/15/22   Rai, Nydia POUR, MD  fluticasone  (FLONASE ) 50 MCG/ACT nasal spray Place 2 sprays into both nostrils daily as needed for allergies or rhinitis. 07/04/22   [name], MD  leptospermum manuka honey (MEDIHONEY) PSTE paste Apply 1 Application topically daily. 01/23/24   Franchot Novel, MD  metolazone  (ZAROXOLYN ) 2.5 MG tablet Take 1 tablet (2.5 mg total) by mouth 3 (three) times a week. Every Tues., Thurs and Sat.  with 40 meq of potassium 03/31/24   McLean, Dalton S, MD  Multiple Vitamins-Minerals (HAIR SKIN NAILS PO) Take 1 capsule by mouth daily.    [provider]  polyethylene glycol (MIRALAX  / GLYCOLAX ) 17 g packet Take 17 g by mouth daily. 01/27/24   Franchot Novel, MD  Potassium Chloride  ER 20 MEQ TBCR Take 2 tablets (40 mEq total) by mouth daily. Take extra 2 tabs with metolazone  03/31/24   McLean, Dalton S, MD  rosuvastatin  (CRESTOR ) 10 MG tablet Take 1 tablet (10 mg total) by mouth daily. 06/20/23   Maree Hue, MD  torsemide  (DEMADEX ) 20 MG tablet Take 5 tablets (100 mg total) by mouth 2 (two) times daily. 03/30/24   [name], MD  traMADol  (ULTRAM ) 50 MG tablet Take 1 tablet (50 mg total) by mouth 3 (three) times daily as needed for moderate pain (pain score 4-6). 04/17/24   [name], MD    Physical Exam: Vitals:   05/09/24 0300 05/09/24 0303 05/09/24 0315 05/09/24 0350  BP: (!) 155/61  138/76   Pulse: 80  76 74  Resp: 19  (!) 26 (!) 26  Temp:  97.7 F (36.5 C)    TempSrc:  Oral    SpO2: 97%  95% 100%  Weight:      Height:        Physical Exam Vitals reviewed.  Constitutional:      General: She is not in acute distress.    Appearance: She is ill-appearing.  HENT:     Head: Normocephalic and atraumatic.  Eyes:     Comments: Unable to examine due to lack of patient cooperation  Cardiovascular:     Rate and Rhythm: Normal rate and regular rhythm.     Heart sounds: Murmur heard.  Pulmonary:     Effort: Pulmonary effort is normal. No respiratory distress.     Breath sounds: No wheezing.  Abdominal:     General: Bowel sounds are normal.     Palpations: Abdomen is soft.     Tenderness: There is no abdominal tenderness. There is no guarding.  Musculoskeletal:     Right lower leg: Edema present.     Left lower leg: Edema present.  Skin:    General: Skin is warm and dry.     Comments: Bilateral lower extremities erythematous and warm to touch.  Large  open wound noted on the anterior left lower leg with necrotic material.  There is another wound on the left lower leg with fibrinous exudate.  Neurological:     Mental Status: She is alert.     Comments: Moving all extremities spontaneously, no focal weakness      Labs on Admission: I have personally reviewed following labs and imaging studies  CBC: Recent Labs  Lab 05/09/24 0025 05/09/24 0136  WBC 7.2  --   NEUTROABS 6.0  --   HGB 7.8* 9.5*  9.5*  HCT 26.0* 28.0*  28.0*  MCV 92.2  --   PLT 222  --    Basic Metabolic Panel: Recent Labs  Lab 05/09/24 0025 05/09/24 0136  NA 140 140  139  K 3.6 3.5  3.5  CL 103 104  CO2 23  --   GLUCOSE 106* 98  BUN 69* 70*  CREATININE 1.39* 1.50*  CALCIUM  9.3  --    GFR: Estimated Creatinine Clearance: 21.5 mL/min (A) (by C-G formula based on SCr of 1.5 mg/dL (H)). Liver Function Tests: Recent Labs  Lab 05/09/24 0025  AST 36  ALT 12  ALKPHOS 108  BILITOT 0.7  PROT 6.8  ALBUMIN  3.7   No results for input(s): LIPASE, AMYLASE in the last 168 hours. No results for input(s): AMMONIA in the last 168 hours. Coagulation Profile: Recent Labs  Lab 05/09/24 0025  INR 1.2   Cardiac Enzymes: No results for input(s): CKTOTAL, CKMB, CKMBINDEX, TROPONINI in the last 168 hours. BNP (last 3 results) Recent Labs    05/09/24 0025  PROBNP 9,242.0*  HbA1C: No results for input(s): HGBA1C in the last 72 hours. CBG: No results for input(s): GLUCAP in the last 168 hours. Lipid Profile: No results for input(s): CHOL, HDL, LDLCALC, TRIG, CHOLHDL, LDLDIRECT in the last 72 hours. Thyroid  Function Tests: No results for input(s): TSH, T4TOTAL, FREET4, T3FREE, THYROIDAB in the last 72 hours. Anemia Panel: No results for input(s): VITAMINB12, FOLATE, FERRITIN, TIBC, IRON , RETICCTPCT in the last 72 hours. Urine analysis:    Component Value Date/Time   COLORURINE YELLOW 05/09/2024 0025    APPEARANCEUR HAZY (A) 05/09/2024 0025   APPEARANCEUR Cloudy (A) 01/27/2018 1330   LABSPEC 1.009 05/09/2024 0025   PHURINE 5.0 05/09/2024 0025   GLUCOSEU NEGATIVE 05/09/2024 0025   HGBUR NEGATIVE 05/09/2024 0025   HGBUR large 11/16/2009 1202   BILIRUBINUR NEGATIVE 05/09/2024 0025   BILIRUBINUR Negative 11/08/2021 1237   BILIRUBINUR Negative 01/27/2018 1330   KETONESUR NEGATIVE 05/09/2024 0025   PROTEINUR NEGATIVE 05/09/2024 0025   UROBILINOGEN 0.2 11/08/2021 1237   UROBILINOGEN 1.0 08/16/2014 1754   NITRITE NEGATIVE 05/09/2024 0025   LEUKOCYTESUR SMALL (A) 05/09/2024 0025    Radiological Exams on Admission: CT Head Wo Contrast Result Date: 05/09/2024 EXAM: CT HEAD WITHOUT 05/09/2024 02:45:25 AM TECHNIQUE: CT of the head was performed without the administration of intravenous contrast. Automated exposure control, iterative reconstruction, and/or weight based adjustment of the mA/kV was utilized to reduce the radiation dose to as low as reasonably achievable. COMPARISON: 03/28/2020 CLINICAL HISTORY: Head trauma, minor (Age >= 65y); AMS FINDINGS: BRAIN AND VENTRICLES: No acute intracranial hemorrhage. No mass effect or midline shift. No extra-axial fluid collection. No evidence of acute infarct. No hydrocephalus. Atrophy and chronic small vessel disease throughout the deep white matter. ORBITS: No acute abnormality. SINUSES AND MASTOIDS: No acute abnormality. SOFT TISSUES AND SKULL: No acute skull fracture. No acute soft tissue abnormality. IMPRESSION: 1. No acute intracranial abnormality. 2. Atrophy and chronic small vessel disease throughout the deep white matter. Electronically signed by: Franky Crease MD 05/09/2024 02:49 AM EST RP Workstation: [id number]   DG Tibia/Fibula Left Result Date: 05/09/2024 EXAM: _VIEWS_ VIEW(S) XRAY OF THE LEFT TIBIA AND FIBULA 05/09/2024 12:37:14 AM COMPARISON: None available. CLINICAL HISTORY: wound wound FINDINGS: BONES AND JOINTS: No fracture, subluxation, or  dislocation. No bone destruction to suggest osteomyelitis. Diffuse osteopenia. No malalignment. SOFT TISSUES: Large soft tissue defect anteriorly within the distal left calf. IMPRESSION: 1. Large soft tissue defect anteriorly within the distal left calf. 2. No bone destruction to suggest osteomyelitis. Electronically signed by: Franky Crease MD 05/09/2024 12:43 AM EST RP Workstation: [id number]   DG Chest Port 1 View Result Date: 05/09/2024 EXAM: 1 VIEW(S) XRAY OF THE CHEST 05/09/2024 12:36:44 AM COMPARISON: 04/10/2024. CLINICAL HISTORY: Questionable sepsis - evaluate for abnormality FINDINGS: LINES, TUBES AND DEVICES: Left AICD remains in place, unchanged. LUNGS AND PLEURA: Small bilateral pleural effusions. No focal pulmonary opacity. No pneumothorax. HEART AND MEDIASTINUM: Cardiomegaly, aortic atherosclerosis. BONES AND SOFT TISSUES: No acute osseous abnormality. IMPRESSION: 1. Small bilateral pleural effusions. 2. Cardiomegaly. Electronically signed by: Franky Crease MD 05/09/2024 12:41 AM EST RP Workstation: [id number]    EKG: Independently reviewed.  A-fib, PVC.  No significant change since previous tracing.  Assessment and Plan  Acute on chronic HFpEF Severe pulmonary hypertension RV failure Patient is followed by advanced heart failure team as outpatient and not a candidate for pulmonary vasodilators.  She was admitted to the hospital a month ago for decompensated heart failure and cardiology had recommended consulting palliative care.  Palliative care meeting  was completed and patient refused hospice.  She was discharged on torsemide  100 mg twice daily and metolazone  2.5 mg 3 times a week.  Last echo done 04/04/2024 showing EF 55 to 60%, grade 3 diastolic dysfunction, RV systolic function moderately reduced, severely elevated pulmonary artery systolic pressure, right atrium moderately dilated, mild to moderate mitral regurgitation, moderate mitral stenosis, severe tricuspid regurgitation, mild  aortic regurgitation, and moderate aortic stenosis.  Presenting with volume overload.  proBNP 9242.  Chest x-ray showing small bilateral pleural effusions.  Currently saturating well on 4 L [place] (uses 2-3 L at baseline), no respiratory distress.  Blood pressure stable.  Continue diuresis with IV Lasix  100 mg twice daily starting in the morning.  Continue home metolazone .  Monitor intake and output, daily weights, renal function.  Continue supplemental oxygen .  Consider reengaging palliative care during this hospitalization for further goals of care discussions with the patient's family.  Large left lower extremity wound/cellulitis Lactate slightly elevated but no fever, tachycardia, or leukocytosis to suggest sepsis.  X-ray of left tibia/fibula negative for osteomyelitis.  Continue linezolid  and Zosyn .  Trend lactate.  Follow-up blood cultures.  Wound care consult.  May need to be reevaluated by plastic surgery depending on further goals of care discussions.  Acute encephalopathy Patient is very confused/oriented to self only, lethargic.  UA not suggestive of infection.  Moving all extremities spontaneously, no focal weakness.  CT head showing no acute intracranial abnormality.  Blood gas without evidence of hypercarbia.  Check TSH, B12, and ammonia level.  Permanent A-fib Failed antiarrhythmics in the past and had multiple cardioversions.  Not on chronic anticoagulation due to history of recurrent GI bleed.  CKD stage IIIb Creatinine currently at baseline, monitor labs.  COPD Stable, no wheezing.  DuoNeb PRN.  Chronic anemia Hemoglobin 9.5 on repeat labs.  Baseline appears to be 9-10.  No GI bleed symptoms reported.  CAD EKG without acute ischemic changes.  Hypertension Blood pressure stable.  Continue diuretics.  Hyperlipidemia Continue Crestor .  Primary pancreatic neuroendocrine tumor Per review of oncology note, her case was discussed with general surgery and it was felt that she is  not a surgical candidate due to her age and comorbidities.  She was started on Sandostatin  injections q28 days on 03/27/2022.  DVT prophylaxis: SQ Heparin  Code Status: Unable to discuss CODE STATUS with the patient due to her altered mental status.  She was listed as DNR/DNI during her hospitalization a month ago. EDP Dr. Griselda spoke to the patient's daughter and confirmed that her CODE STATUS is DNR/DNI. Family Communication: No family available at this time. Level of care: Progressive Care Unit Admission status: It is my clinical opinion that admission to INPATIENT is reasonable and necessary because of the expectation that this patient will require hospital care that crosses at least 2 midnights to treat this condition based on the medical complexity of the problems presented.  Given the aforementioned information, the predictability of an adverse outcome is felt to be significant.  Editha Ram MD [facility]  If 7PM-7AM, please contact night-coverage [email or web address]  05/09/2024, 4:34 AM       [1]  Allergies Allergen Reactions   Codeine Nausea And Vomiting and Other (See Comments)    Hallucinations, too- CANNOT HAVE; thinks she may have received Narcan  to reverse the effect   Dilaudid  [Hydromorphone ] Other (See Comments)    Excessive Somnolence- Required Narcan  X2   Tape Other (See Comments)    CAN TOLERATE ONLY EASY-RELEASE, PAPER TAPE, AS THE  SKIN IS VERY THIN AND WILL BRUISE AND TEAR VERY EASILY   Amoxil  [Amoxicillin ] Nausea Only   Asa [Aspirin ] Other (See Comments)    Told to avoid due to reduced kidney function, has 1 remaining kidney   Ms Contin  [Morphine ] Nausea And Vomiting and Other (See Comments)    Hallucinations, also   Neurontin  [Gabapentin ] Other (See Comments)    Out of it   Nsaids Other (See Comments)    Told to avoid due to reduced kidney function, has 1 remaining kidney   "

## 2024-05-09 NOTE — ED Notes (Signed)
 Daughter requested Tramadol  for patient. Patient stated earlier she did not care if she had it or not. Came into room found patient resting well and did not wish to disturb her. Patient did not receive medication at this time related to concerns of ability to take safely.

## 2024-05-09 NOTE — ED Notes (Signed)
 Pt had small bowel movement. Linen changed and pt cleaned up.

## 2024-05-09 NOTE — Plan of Care (Signed)
 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  Patient was admitted to the hospital a month ago for decompensated heart failure and left lower extremity cellulitis the setting of large wound. Cardiology had recommended consulting palliative care. Palliative care meeting was completed and patient refused hospice.  Prior to this admission, patient had a fall resulting in a large left lower leg laceration which was sutured in the ED on 11/28 but wound edges not approximated and had intermittent bleeding.  She was treated with 6 days of antibiotics during her hospitalization and was also evaluated by plastic surgery and they recommended continuing wound care. She presented in the ED further evaluation of confusion, worsening shortness of breath and concern for infection in the left lower extremity wound.  Patient was mildly tachypneic other vitals were stable.  She is saturating well on 4 L of oxygen .  UA not suggestive of infection.  Lactic acid 2.6.  Chest x-ray showing small bilateral pleural effusion.  X-ray of left tibia/fibula negative for osteomyelitis.  CT head no acute abnormality found.  Patient was admitted for further evaluation , Patient was given Lasix  100 mg and started on linezolid  and Zosyn . Patient's CODE STATUS DNR/DNI. Patient was seen and examined at bedside.  Patient seems slightly confused but improving.  Patient continued on diuresis with Lasix  100 mg twice daily,  continued on metolazone .  Advanced heart failure team consultation requested.

## 2024-05-10 ENCOUNTER — Encounter: Admitting: Physician Assistant

## 2024-05-10 DIAGNOSIS — I5033 Acute on chronic diastolic (congestive) heart failure: Secondary | ICD-10-CM | POA: Diagnosis not present

## 2024-05-10 LAB — BASIC METABOLIC PANEL WITH GFR
Anion gap: 16 — ABNORMAL HIGH (ref 5–15)
BUN: 73 mg/dL — ABNORMAL HIGH (ref 8–23)
CO2: 21 mmol/L — ABNORMAL LOW (ref 22–32)
Calcium: 8.7 mg/dL — ABNORMAL LOW (ref 8.9–10.3)
Chloride: 102 mmol/L (ref 98–111)
Creatinine, Ser: 1.49 mg/dL — ABNORMAL HIGH (ref 0.44–1.00)
GFR, Estimated: 33 mL/min — ABNORMAL LOW
Glucose, Bld: 103 mg/dL — ABNORMAL HIGH (ref 70–99)
Potassium: 3.9 mmol/L (ref 3.5–5.1)
Sodium: 140 mmol/L (ref 135–145)

## 2024-05-10 LAB — PHOSPHORUS: Phosphorus: 3.4 mg/dL (ref 2.5–4.6)

## 2024-05-10 LAB — LACTIC ACID, PLASMA: Lactic Acid, Venous: 1.2 mmol/L (ref 0.5–1.9)

## 2024-05-10 LAB — CBC
HCT: 25.3 % — ABNORMAL LOW (ref 36.0–46.0)
Hemoglobin: 7.5 g/dL — ABNORMAL LOW (ref 12.0–15.0)
MCH: 27.5 pg (ref 26.0–34.0)
MCHC: 29.6 g/dL — ABNORMAL LOW (ref 30.0–36.0)
MCV: 92.7 fL (ref 80.0–100.0)
Platelets: 208 K/uL (ref 150–400)
RBC: 2.73 MIL/uL — ABNORMAL LOW (ref 3.87–5.11)
RDW: 19.9 % — ABNORMAL HIGH (ref 11.5–15.5)
WBC: 6 K/uL (ref 4.0–10.5)
nRBC: 0.3 % — ABNORMAL HIGH (ref 0.0–0.2)

## 2024-05-10 LAB — MAGNESIUM: Magnesium: 1.7 mg/dL (ref 1.7–2.4)

## 2024-05-10 NOTE — Progress Notes (Addendum)
 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  Cardiology consulted after admission.  Has elevated BNP.  Chest x-ray showed small bilateral pleural effusion.  X-ray of left tibia/femur negative for osteomyelitis.  CTA did not show any acute findings.  Cardiology following, on IV diuresis.  Palliative care consulted for goals of care.  Wound care consulted  Assessment & Plan:  Principal Problem:   Acute on chronic heart failure with preserved ejection fraction (HFpEF) (HCC) Active Problems:   Primary pancreatic neuroendocrine tumor (HCC)   Atrial fibrillation, permanent (HCC)   Leg wound, left   Acute encephalopathy   Acute on chronic HFpEF/severe pulmonary hypertension/right ventricular failure: Followed by advanced heart failure team as an outpatient, not a candidate for pulmonary vasodilators.  She was recently here a month ago for decompensated heart failure, cardiology recommending palliative care/hospice approach.  Palliative care meeting was completed and she refused hospice.  She was discharged on torsemide , metolazone .  Last echo had shown EF of 55 - 60%, grade 3 diastolic dysfunction, moderately reduced right ventricular function, severe elevated pulmonary artery systolic pressure.  Patient appeared volume overloaded on plantation, elevated proBNP.  On 4 L of  oxygen  per minute. Cardiology following.  On IV diuresis  Chronic hypoxic respiratory failure: On 2 to 3 L of oxygen  per minute at baseline.  Currently on 4.  Continue to wean.  Acute encephalopathy : Very confused, oriented to self only on presentation.  Now almost back to baseline.  Urine analysis not suspicious for UTI.  CT head did not show any acute findings.  Blood gas without evidence of hypercarbia.Ammonia, vitamin B12, TSH normal  Large left lower extremity cellulitis/wound: History of large left lower extremity laceration after the fall.  Sutures was placed on 12/28.  She has chronic wounds on her distal leg and dorsum of the foot.  No fever or leukocytosis.  X-ray of the left tibia/shoulder negative for osteomyelitis.  On linezolid , Zosyn .  Wound care consulted.  She was seen by plastic surgery for this on last admission, recommended to continue wound care and finish the antibiotics.  She also follows with outpatient wound care.  Permanent A-fib: Failed antiarrhythmics in the past.  Not on chronic anticoagulation due to history of recurrent GI bleed  CKD stage IIIb: Currently kidney function at baseline  History of COPD: Currently stable, not in exacerbation  Coronary artery disease: No anginal symptoms at present  Hypertension:BP  stable.  Hyperlipidemia:On  Crestor   Anemia of chronic disease: Currently hemoglobin stable.  Continue to monitor, transfuse if drops less than 7.  Primary pancreatic neuroendocrine tumor: Follows with oncology, general surgery.  She is not a candidate for surgery due to her age and comorbidities.  She was started on Sandostatin  injection every 28 days on 03/27/22  Goals of care: Elderly patient with multiple comorbidities.  Very poor prognosis.  CODE STATUS DNR.  Palliative  care consulted again        DVT prophylaxis:heparin   injection 5,000 Units Start: 05/09/24 0600     Code Status: Limited: Do not attempt resuscitation (DNR) -DNR-LIMITED  -Do Not Intubate/DNI   Family Communication: None at bedside  Patient status:Inpatient  Patient is from :home  Anticipated discharge to:not sure  Estimated DC date:not sure   Consultants: Cardiology  Procedures:None  Antimicrobials:  Anti-infectives (From admission, onward)    Start     Dose/Rate Route Frequency Ordered Stop   05/09/24 1000  linezolid  (ZYVOX ) IVPB 600 mg        600 mg 300 mL/hr over 60 Minutes Intravenous Every 12 hours 05/09/24 0547     05/09/24 0600  piperacillin -tazobactam (ZOSYN ) IVPB 3.375 g        3.375 g 12.5 mL/hr over 240 Minutes Intravenous Every 8 hours 05/09/24 0553     05/09/24 0030  piperacillin -tazobactam (ZOSYN ) IVPB 3.375 g        3.375 g 100 mL/hr over 30 Minutes Intravenous  Once 05/09/24 0026 05/09/24 0108   05/09/24 0030  linezolid  (ZYVOX ) IVPB 600 mg        600 mg 300 mL/hr over 60 Minutes Intravenous  Once 05/09/24 0026 05/09/24 0500       Subjective: Patient seen and examined at bedside today.  Hemodynamically stable.  Lying in bed.  Appeared more comfortable this morning.  Alert and awake, mostly oriented.  Knows current month.   Speaking ion full sentences.  Examined left lower extremity wound  Objective: Vitals:   05/09/24 2201 05/09/24 2352 05/10/24 0310 05/10/24 0740  BP: (!) 153/43 (!) 121/43  (!) 114/90  Pulse: 96 61  94  Resp: 20  20   Temp: (!) 97.5 F (36.4 C) (!) 97.2 F (36.2 C) (!) 97.5 F (36.4 C) 97.6 F (36.4 C)  TempSrc: Oral Oral Oral Oral  SpO2: 97%   99%  Weight: 59.3 kg  57.1 kg   Height: 5' 6 (1.676 m)       Intake/Output Summary (Last 24 hours) at 05/10/2024 1031 Last data filed at 05/10/2024 0851 Gross per 24 hour  Intake 326.82 ml  Output --  Net 326.82 ml   Filed Weights   05/08/24 2252 05/09/24 2201 05/10/24 0310  Weight: 52.6 kg 59.3 kg 57.1 kg    Examination:  General exam: Overall comfortable, not in distress, very deconditioned.  Chronically ill looking HEENT:  PERRL Respiratory system:  no wheezes or crackles, diminished sounds on bases Cardiovascular system: Irregularly irregular rhythm Gastrointestinal system: Abdomen is nondistended, soft and nontender. Central nervous system: Alert and awake, mostly oriented Extremities: Bilateral lower extremity edema, no clubbing ,no cyanosis Skin: Large laceration lesion with sutures on left leg chronic wounds on the distal left leg and dorsum of the left foot( see picture)     Data Reviewed: I have personally reviewed following labs and imaging studies  CBC: Recent Labs  Lab 05/09/24 0025 05/09/24 0136 05/10/24 0333  WBC 7.2  --  6.0  NEUTROABS 6.0  --   --   HGB 7.8* 9.5*  9.5* 7.5*  HCT 26.0* 28.0*  28.0* 25.3*  MCV 92.2  --  92.7  PLT 222  --  208   Basic Metabolic Panel: Recent Labs  Lab 05/09/24 0025 05/09/24 0136 05/10/24 0006 05/10/24 0333  NA 140 140  139 140  --   K 3.6 3.5  3.5 3.9  --   CL 103 104 102  --   CO2 23  --  21*  --   GLUCOSE 106*  98 103*  --   BUN 69* 70* 73*  --   CREATININE 1.39* 1.50* 1.49*  --   CALCIUM  9.3  --  8.7*  --   MG  --   --   --  1.7  PHOS  --   --   --  3.4     Recent Results (from the past 240 hours)  Blood Culture (routine x 2)     Status: None (Preliminary result)   Collection Time: 05/09/24 12:25 AM   Specimen: BLOOD  Result Value Ref Range Status   Specimen Description BLOOD RIGHT ANTECUBITAL  Final   Special Requests   Final    BOTTLES DRAWN AEROBIC AND ANAEROBIC Blood Culture adequate volume   Culture   Final    NO GROWTH 1 DAY Performed at [facility], [address], [place], [place] [address]    Report Status PENDING  Incomplete  Blood Culture (routine x 2)     Status: None (Preliminary result)   Collection Time: 05/09/24  8:55 AM   Specimen: BLOOD  Result Value Ref Range Status   Specimen Description BLOOD RIGHT ANTECUBITAL  Final   Special Requests   Final    BOTTLES DRAWN AEROBIC ONLY Blood Culture  results may not be optimal due to an inadequate volume of blood received in culture bottles   Culture   Final    NO GROWTH < 24 HOURS Performed at [facility], [address] [place], [place] [address]    Report Status PENDING  Incomplete     Radiology Studies: CT Head Wo Contrast Result Date: 05/09/2024 EXAM: CT HEAD WITHOUT 05/09/2024 02:45:25 AM TECHNIQUE: CT of the head was performed without the administration of intravenous contrast. Automated exposure control, iterative reconstruction, and/or weight based adjustment of the mA/kV was utilized to reduce the radiation dose to as low as reasonably achievable. COMPARISON: 03/28/2020 CLINICAL HISTORY: Head trauma, minor (Age >= 65y); AMS FINDINGS: BRAIN AND VENTRICLES: No acute intracranial hemorrhage. No mass effect or midline shift. No extra-axial fluid collection. No evidence of acute infarct. No hydrocephalus. Atrophy and chronic small vessel disease throughout the deep white matter. ORBITS: No acute abnormality. SINUSES AND MASTOIDS: No acute abnormality. SOFT TISSUES AND SKULL: No acute skull fracture. No acute soft tissue abnormality. IMPRESSION: 1. No acute intracranial abnormality. 2. Atrophy and chronic small vessel disease throughout the deep white matter. Electronically signed by: Franky Crease MD 05/09/2024 02:49 AM EST RP Workstation: [id number]   DG Tibia/Fibula Left Result Date: 05/09/2024 EXAM: _VIEWS_ VIEW(S) XRAY OF THE LEFT TIBIA AND FIBULA 05/09/2024 12:37:14 AM COMPARISON: None available. CLINICAL HISTORY: wound wound FINDINGS: BONES AND JOINTS: No fracture, subluxation, or dislocation. No bone destruction to suggest osteomyelitis. Diffuse osteopenia. No malalignment. SOFT TISSUES: Large soft tissue defect anteriorly within the distal left calf. IMPRESSION: 1. Large soft tissue defect anteriorly within the distal left calf. 2. No bone destruction to suggest osteomyelitis. Electronically signed by: Franky Crease MD 05/09/2024  12:43 AM EST RP Workstation: [id number]   DG Chest Port 1 View Result Date: 05/09/2024 EXAM: 1 VIEW(S) XRAY OF THE CHEST 05/09/2024 12:36:44 AM COMPARISON: 04/10/2024. CLINICAL HISTORY: Questionable sepsis - evaluate for abnormality FINDINGS: LINES, TUBES AND DEVICES: Left AICD remains in place, unchanged. LUNGS AND PLEURA: Small bilateral pleural effusions. No focal pulmonary opacity. No pneumothorax. HEART AND MEDIASTINUM: Cardiomegaly, aortic atherosclerosis. BONES AND SOFT TISSUES: No acute osseous abnormality. IMPRESSION: 1. Small bilateral pleural effusions. 2. Cardiomegaly. Electronically signed by: Franky Crease  MD 05/09/2024 12:41 AM EST RP Workstation: [id number]    Scheduled Meds:  heparin   5,000 Units Subcutaneous Q8H   [START ON 05/11/2024] metolazone   2.5 mg Oral Once per day on Tuesday Thursday Saturday   rosuvastatin   10 mg Oral Daily   Continuous Infusions:  furosemide  100 mg (05/10/24 0101)   linezolid  (ZYVOX ) IV 600 mg (05/10/24 0130)   piperacillin -tazobactam (ZOSYN )  IV 3.375 g (05/10/24 0118)     LOS: 1 day   Ivonne Mustache, MD [facility] P1/09/2024, 10:31 AM  "

## 2024-05-10 NOTE — Progress Notes (Signed)
 Heart Failure Navigator Progress Note  Assessed for Heart & Vascular TOC clinic readiness.  Patient does not meet criteria due to she is an Advanced Heart Failure Team patient of Dr. Rolan. .   Navigator will sign off at this time.   Stephane Haddock, BSN, Scientist, clinical (histocompatibility and immunogenetics) Only

## 2024-05-10 NOTE — Progress Notes (Signed)
 "  Progress Note  Patient Name: [name] Date of Encounter: 05/10/2024 [facility] Cardiologist: Evalene Lunger, MD   Interval Summary   Patient shared her breathing when sitting up is okay, though reporting significant orthopnea. Had some chest discomfort yesterday that was described as sharp. No reoccurrence.  A lot of pain in her legs.  Little to no appetite.   Vital Signs Vitals:   05/09/24 2352 05/10/24 0310 05/10/24 0740 05/10/24 1115  BP: (!) 121/43  (!) 114/90 108/89  Pulse: 61  94 60  Resp:  20  17  Temp: (!) 97.2 F (36.2 C) (!) 97.5 F (36.4 C) 97.6 F (36.4 C) 97.7 F (36.5 C)  TempSrc: Oral Oral Oral Oral  SpO2:   99% 100%  Weight:  57.1 kg    Height:        Intake/Output Summary (Last 24 hours) at 05/10/2024 1141 Last data filed at 05/10/2024 0851 Gross per 24 hour  Intake 326.82 ml  Output --  Net 326.82 ml      05/10/2024    3:10 AM 05/09/2024   10:01 PM 05/08/2024   10:52 PM  Last 3 Weights  Weight (lbs) 125 lb 14.1 oz 130 lb 11.7 oz 116 lb  Weight (kg) 57.1 kg 59.3 kg 52.617 kg      Telemetry/ECG  V-paced HR 60 - Personally Reviewed  Physical Exam  GEN: No acute distress.   Neck: No JVD, though upright during assessment  Cardiac: RRR, blowing murmur 4/6  Respiratory: Clear to auscultation bilaterally. GI: Soft, nontender, non-distended  MS: Patient requested observation only, erythema to bilateral legs with clean/dry dressing to left leg. Appeared to be edematous.   Assessment & Plan  [name] is a 89 y.o. female with a hx of pancreatic neuroendocrine tumor, chronic respiratory failure on home O2, permanent A-fib off of anticoagulation due to GI bleed, chronic diastolic heart failure, chronic RV failure with pulmonary venous hypertension, stage IV CKD, remote NICM s/p [employer] ICD, history of renal cell carcinoma s/pt right nephrectomy who presented to the ED for confusion, worsening shortness of breath and concern for  infection of her left lower extremity wound. CXR showed volume overload. Pro BNP 9242. Lactic Acid 2.6 -> 2.0.  Acute on chronic diastolic heart failure RV Failure On admission was noted to be significantly volume overloaded.  Renal function at baseline.  No UOP charted. Weight down 5lbs.  On exam appears to be volume up.   Palliative care was recommended yesterday, family in agreement. Consult placed.  Patient reports low urine output, though is not sure. Reaching out to RN, if true may need to bladder scan. Patient to get purewick today for I/Os.   Continue lasix  gtt Continue metolazone  2.5 mg Tues, Thurs, Sat. Continue potassium repletion with use.  SGLT2i deferred with chronic wounds Spironolactone  deferred with hypotension  Anemia  Hgb 7.8 -> 9.5 -> 7.5 Seems around baseline? Has not required blood transfusion this admission.  Per primary  Hypotension-improved BP: 108/89 Lowest yesterday 80/50  Lactic Acidosis-Resolved.   Permanent Atrial Fibrillation Not on chronic anticoagulation 2/2 hx of GI bleed During interview was V-pacing.   Severe pulmonary hypertension Followed by advanced heart failure. Not a candidate for pulmonary vasodilator   Valvular Heart Disease Not a candidate for repair/replacement  Per primary Chronic leg wounds on broad spectrum antibiotics CKD Pancreatic neuroendocrine tumor Chronic respiratory failure with COPD Acute encephalopathy     For questions or updates, please contact [facility] Please consult [email or web address] for contact info under       Signed, Leontine LOISE Salen, PA-C   "

## 2024-05-10 NOTE — TOC CM/SW Note (Signed)
 Transition of Care [facility]) - Inpatient Brief Assessment   Patient Details  Name: [name] MRN: [id number] Date of Birth: 1933/12/28  Transition of [facility]) CM/SW Contact:    Tom-Johnson, Tram Wrenn Daphne, RN Phone Number: 05/10/2024, 4:26 PM   Clinical Narrative:  Patient presented to the ED with increased Confusion, Shortness of Breath, Lt Leg Infection. Chest X-ray showing small bilateral Pleural Effusions. Admitted with Acute on Chronic Heart Failure with Preserved Ejection Fraction.  Patient is currently on 4L O2, on 2-3L O2 at home. Patient recently admitted a month ago with Decompensated Heart failure and LLE Cellulitis, Palliative was consulted for GOC, Patient declined Hospice services. Patient with recent falls at home. Patient is on IV abx, IV Lasix , Neb tx. Cardiology following.   CM went to assess patient at bedside. Patient noted to be confused at this time. CM called and spoke with [name], patient's daughter([phone]). [name] states patient lives with her grandson who assists with her care. Has a cane, walker, rollator, w/c, shower seat and grab bars at home.  PCP is [name], MD and uses [facility].  [name] requested patient discharge to a short term rehab prior going home to build up her strength d/t recent falls. Patient is currently active with home health PT/OT/RN/Aide disciplines with [facility]. Resumption of care order will be placed if home health recommended at discharge.   Patient not Medically ready for discharge.  CM will continue to follow as patient progresses with care towards discharge.           Transition of Care Asessment: Insurance and Status: Insurance coverage has been reviewed Patient has primary care physician: Yes Home environment has been reviewed: Yes Prior level of function:: Modified Independent Prior/Current Home Services: No current home services Social Drivers of Health Review: SDOH reviewed no interventions  necessary Readmission risk has been reviewed: Yes Transition of care needs: transition of care needs identified, TOC will continue to follow

## 2024-05-11 ENCOUNTER — Telehealth: Payer: Self-pay | Admitting: Nurse Practitioner

## 2024-05-11 ENCOUNTER — Encounter: Payer: Self-pay | Admitting: Hematology

## 2024-05-11 ENCOUNTER — Telehealth: Payer: Self-pay

## 2024-05-11 DIAGNOSIS — I4821 Permanent atrial fibrillation: Secondary | ICD-10-CM

## 2024-05-11 DIAGNOSIS — I5033 Acute on chronic diastolic (congestive) heart failure: Secondary | ICD-10-CM | POA: Diagnosis not present

## 2024-05-11 DIAGNOSIS — S81802A Unspecified open wound, left lower leg, initial encounter: Secondary | ICD-10-CM

## 2024-05-11 DIAGNOSIS — Z66 Do not resuscitate: Secondary | ICD-10-CM

## 2024-05-11 DIAGNOSIS — Z515 Encounter for palliative care: Secondary | ICD-10-CM

## 2024-05-11 DIAGNOSIS — C7A8 Other malignant neuroendocrine tumors: Secondary | ICD-10-CM

## 2024-05-11 LAB — CBC
HCT: 24.3 % — ABNORMAL LOW (ref 36.0–46.0)
Hemoglobin: 7.3 g/dL — ABNORMAL LOW (ref 12.0–15.0)
MCH: 27.8 pg (ref 26.0–34.0)
MCHC: 30 g/dL (ref 30.0–36.0)
MCV: 92.4 fL (ref 80.0–100.0)
Platelets: 207 K/uL (ref 150–400)
RBC: 2.63 MIL/uL — ABNORMAL LOW (ref 3.87–5.11)
RDW: 20.1 % — ABNORMAL HIGH (ref 11.5–15.5)
WBC: 6.7 K/uL (ref 4.0–10.5)
nRBC: 0.3 % — ABNORMAL HIGH (ref 0.0–0.2)

## 2024-05-11 LAB — BASIC METABOLIC PANEL WITH GFR
Anion gap: 14 (ref 5–15)
BUN: 67 mg/dL — ABNORMAL HIGH (ref 8–23)
CO2: 24 mmol/L (ref 22–32)
Calcium: 9 mg/dL (ref 8.9–10.3)
Chloride: 97 mmol/L — ABNORMAL LOW (ref 98–111)
Creatinine, Ser: 1.59 mg/dL — ABNORMAL HIGH (ref 0.44–1.00)
GFR, Estimated: 31 mL/min — ABNORMAL LOW
Glucose, Bld: 101 mg/dL — ABNORMAL HIGH (ref 70–99)
Potassium: 3.4 mmol/L — ABNORMAL LOW (ref 3.5–5.1)
Sodium: 135 mmol/L (ref 135–145)

## 2024-05-11 MED ORDER — FLUTICASONE PROPIONATE 50 MCG/ACT NA SUSP
1.0000 | Freq: Every day | NASAL | Status: DC
  Administered 2024-05-12 – 2024-05-15 (×3): 1 via NASAL
  Filled 2024-05-11: qty 16

## 2024-05-11 MED ORDER — MAGNESIUM SULFATE 2 GM/50ML IV SOLN
2.0000 g | Freq: Once | INTRAVENOUS | Status: AC
  Administered 2024-05-11: 2 g via INTRAVENOUS
  Filled 2024-05-11: qty 50

## 2024-05-11 MED ORDER — POTASSIUM CHLORIDE CRYS ER 20 MEQ PO TBCR
40.0000 meq | EXTENDED_RELEASE_TABLET | Freq: Once | ORAL | Status: AC
  Administered 2024-05-11: 40 meq via ORAL
  Filled 2024-05-11: qty 2

## 2024-05-11 MED ORDER — ONDANSETRON HCL 4 MG/2ML IJ SOLN: 4.0000 mg | Freq: Four times a day (QID) | INTRAMUSCULAR | Status: DC | PRN

## 2024-05-11 NOTE — Progress Notes (Signed)
 IV zosyn  daily dose was clamped. Per verbal from pharmacy unclamp and run @1900  skip @2200  dose.

## 2024-05-11 NOTE — Consult Note (Cosign Needed Addendum)
 "                                                  Palliative Care Consult Note                                  Date: 05/11/2024   Patient Name: [name]  DOB: 08-04-1933  MRN: [id number]  Age / Sex: 89 y.o., female  PCP: [name], MD Referring Physician: Jillian Buttery, MD  Reason for Consultation: Establishing goals of care  HPI/Patient Profile: 89 y.o. female  with past medical history of chronic HFpEF, chronic RV failure with pulmonary hypertension, CKD stage IV, permanent A-fib off anticoagulation due to GI bleed, pancreatic neuroendocrine tumor, NICM s/p Medtronic ICD, and history of renal cell carcinoma s/p right nephrectomy.  She presented to the ED on 05/08/2024 with confusion, shortness of breath, and concern for infection of left lower extremity wound.  Chest x-ray showed volume overload and proBNP 9242.  Palliative Medicine has been consulted for goals of care discussions and complex medical decision making.   Clinical Assessment and Goals of Care:   Extensive chart review has been completed including labs, vital signs, imaging, progress/consult notes, orders, medications and available advance directive documents.    I met with patient and her daughter [name] to discuss diagnosis, prognosis, GOC, disposition, and options.  Patient is well known to PMT from previous hospitalizations.  I re-introduced Palliative Medicine as specialized medical care for people living with serious illness. It focuses on providing relief from the symptoms and stress of a serious illness.   Created space and opportunity for patient and daughter to express thoughts and feelings regarding current medical situation. Values and goals of care were attempted to be elicited.  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 She is widowed. She has 2 daughters [name] and [name]), 5 grandchildren, and 2 great-grandchildren.   Functional Status: Patient lives  at home with her grandson. Her functional status has declined since she fell out of bed and was admitted on 04/02/24. Prior to this, she was ambulatory with a walker and able to perform basic ADLs (dressing, taking bird baths in the sink). She was able to get up by herself, walk to the kitchen, and fix herself something simple to eat. She did become easily fatigued and needed to take frequent breaks.   Discussion: We discussed patient's current illness and what it means in the larger context of her ongoing co-morbidities. Current clinical status was reviewed. Discussed natural trajectory of heart failure as progressive and life-limiting.  Patient and daughter both understand that her health will continue to decline in the setting of progressive heart failure as well as her additional co-morbidities. They also understand she will likely be frequently rehospitalized as a result of her chronic disease burden.  We discussed the difference between current scope of care (limited interventions, treat the treatable) versus comfort-focused care. Patient is clear that she is not ready for hospice at this time, mainly because this would involve stopping Retacrit  infusions every 2 weeks. Patient reports that she feels significantly better after these infusions and wants to continue them as long as possible.    Patient and daughter state the goal is to regain strength.  Daughter is especially hopeful that patient could return to her baseline prior to the fall on 04/02/24. She wants her to go to rehab, preferable at [facility].  Discussed wound care for LLE and recommendations from [facility] nurse, which consists of cleansing the wounds, applying a dressing, and covering with foam. Patient is agreeable to continue current wound care regimen.  Discussed the importance of continued conversation with the medical team regarding overall plan of care and treatment options, ensuring decisions are within the context of the  patients values and GOCs.  Questions and concerns addressed. Emotional support provided.   Review of Systems  Constitutional:  Positive for fatigue.  Skin:  Positive for wound.    Objective:   Primary Diagnoses: Present on Admission:  Leg wound, left  Primary pancreatic neuroendocrine tumor [facility])  Atrial fibrillation, permanent [facility])   Physical Exam Vitals reviewed.  Constitutional:      General: She is not in acute distress.    Comments: Frail, chronically ill-appearing  Cardiovascular:     Rate and Rhythm: Normal rate.  Pulmonary:     Effort: Pulmonary effort is normal.  Skin:    Comments: LLE wounds with dressings intact  Neurological:     Mental Status: She is alert and oriented to person, place, and time.     Palliative Assessment/Data: PPS 40-50%     Assessment & Plan:   SUMMARY OF RECOMMENDATIONS   Continue current supportive interventions Patient is not ready for hospice, but understands she will need it in the future Goal is for rehab to improve strength and functional status Continue outpatient palliative at discharge PMT will continue to follow and support  Primary Decision Maker: PATIENT  Existing Vynca/ACP Documentation: MOST form (04/12/24) with the following treatment preferences: DNR, limited additional interventions, antibiotics if indicated, IV fluids if indicated, and no feeding tube  Code Status/Advance Care Planning: DNR/DNI  Discharge Planning:  To Be Determined   Discussed with: Dr. Jillian, cardiology, RN, [facility]    Thank you for allowing us  to participate in the care of [name]   Time Total: 75 minutes  Detailed review of medical records (labs, imaging, vital signs), medically appropriate exam, discussed with treatment team, counseling and education to patient, family, & staff, documenting clinical information, coordination of care.   Signed by: Recardo Loll, NP Palliative Medicine Team  Team Phone #  [phone]  For individual providers, please see AMION                "

## 2024-05-11 NOTE — Progress Notes (Signed)
 "  Progress Note  Patient Name: [name] Date of Encounter: 05/11/2024 [facility] Cardiologist: Evalene Lunger, MD   Interval Summary   Shared she does feel a little better since she was admitted though ongoing shortness of breath with significant orthopnea. Patient reported her leg pain is more troublesome.  Shared that she is not interested in aggressive wound care for them, she is okay with gentle cleaning with solution, though would like to stop aggressive, abrasive cleaning with the understanding that the infection could worsen. She is very interested in speaking with palliative care, as she is very tired.   Vital Signs Vitals:   05/10/24 2310 05/11/24 0430 05/11/24 0500 05/11/24 0756  BP: (!) 136/37 96/63  126/64  Pulse: 60 65  70  Resp: 18 16    Temp: (!) 97.5 F (36.4 C) 98.1 F (36.7 C)  97.6 F (36.4 C)  TempSrc: Oral Oral  Oral  SpO2: 99% 100%    Weight:   55.7 kg   Height:        Intake/Output Summary (Last 24 hours) at 05/11/2024 0814 Last data filed at 05/10/2024 2059 Gross per 24 hour  Intake 1347.91 ml  Output 500 ml  Net 847.91 ml      05/11/2024    5:00 AM 05/10/2024    3:10 AM 05/09/2024   10:01 PM  Last 3 Weights  Weight (lbs) 122 lb 12.7 oz 125 lb 14.1 oz 130 lb 11.7 oz  Weight (kg) 55.7 kg 57.1 kg 59.3 kg      Telemetry/ECG  AF HR ~75, occasional PVCs, a few episode of NSVT ~3 beats- Personally Reviewed  Physical Exam  GEN: cachetic appearing, chronically ill. Pleasant in no acute distress   Neck: No JVD, though patient is sitting upright and unable to tolerate laying flat Cardiac: Irregularly, irregular, 3/6 blowing systolic murmur. Radial pulse 2+ Respiratory: Clear to auscultation bilaterally. MS: appears edematous with overlying erythema, dry skin with crusting. Dressing appears clean and dry.   Assessment & Plan  [name] is a 89 y.o. female with a hx of pancreatic neuroendocrine tumor, chronic respiratory failure on  home O2, permanent A-fib off of anticoagulation due to GI bleed, chronic diastolic heart failure, chronic RV failure with pulmonary venous hypertension, stage IV CKD, remote NICM s/p Medtronic ICD, history of renal cell carcinoma s/pt right nephrectomy who presented to the ED for confusion, worsening shortness of breath and concern for infection of her left lower extremity wound. CXR showed volume overload. Pro BNP 9242. Lactic Acid 2.6 -> 2.0.   GOC Spoke to primary team, patient shared today that she is very tired and is wanting to deescalate treatments. Shared that though she is not ready to die, she has lived a long life and would like to begin initiating some comfort measures.  Palliative care consulted and aware.   Acute on chronic diastolic heart failure RV Failure On admission was noted to be significantly volume overloaded.  Renal function at baseline.  Patient now has a purewick, can collect urine output. Will follow today.  Weight down 8lbs.  On exam appears volume up    Palliative care consult pending.   Continue to replete K, mag yesterday was 1.7 with no repletion, will replete today too   Continue lasix  gtt Continue metolazone  2.5 mg Tues, Thurs, Sat. Continue potassium repletion with use.  SGLT2i deferred with chronic wounds Spironolactone  deferred with hypotension  Anemia  Hgb 7.8 -> 9.5 -> 7.5-> 7.3  Seems around baseline? Has not required blood transfusion this admission.  Per primary   Hypotension-improved BP: 126/64   Lactic Acidosis-Resolved.    Permanent Atrial Fibrillation Not on chronic anticoagulation 2/2 hx of GI bleed, see anemia above  Currently in rate controlled AF   Severe pulmonary hypertension Followed by advanced heart failure. Not a candidate for pulmonary vasodilator    Valvular Heart Disease Not a candidate for repair/replacement  Per primary Chronic leg wounds on broad spectrum antibiotics CKD Pancreatic neuroendocrine tumor Chronic  respiratory failure with COPD Acute encephalopathy    For questions or updates, please contact [facility] Please consult [email or web address] for contact info under       Signed, Leontine LOISE Salen, PA-C   "

## 2024-05-11 NOTE — Evaluation (Addendum)
 Physical Therapy Evaluation Patient Details Name: [name] MRN: [id number] DOB: 1933/09/19 Today's Date: 05/11/2024  History of Present Illness  The pt is an 89 yo female presenting 1/3 with infection of LLE wound (admitted 11/28-12/8 after a fall with LE wounds). Work up also revealed SOB and AMS. Admitted for management of CHF exacerbation and cellulitis. 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   Clinical Impression  Pt in bed upon arrival of PT, agreeable to evaluation at this time. Prior to admission the pt was mobilizing with use of a cane without any new falls since d/c home on 12/8. However, pt also reports minimal mobility at home due to significant pain and decreased energy. The pt was able to complete bed mobility and transfers with minA this session, but was limited to pivotal steps only (~4 ft) due to pain, fatigue, and nausea with activity. The pt presents with poor power and endurance in LE as well as limited strength generally which limits the utility of DME to off-weight LLE with gait. Will benefit from continued skilled PT to progress functional endurance, stability, and mobility acutely and at inpatient rehab <3hours/day prior to return home for optimal recovery of strength and independence.       If plan is discharge home, recommend the following: A little help with walking and/or transfers;A little help with bathing/dressing/bathroom;Assistance with cooking/housework;Assist for transportation;Help with stairs or ramp for entrance   Can travel by private vehicle   Yes     Equipment Recommendations None recommended by PT  Recommendations for Other Services       Functional Status Assessment Patient has had a recent decline in their functional status and demonstrates the ability to make significant improvements in function in a reasonable and predictable amount of time.     Precautions / Restrictions Precautions Precautions: Fall Recall of Precautions/Restrictions: Intact Precaution/Restrictions Comments: 4L O2 in session, LE wounds Restrictions Weight Bearing Restrictions Per Provider Order: No      Mobility  Bed Mobility Overal bed mobility: Needs Assistance Bed Mobility: Supine to Sit     Supine to sit: Min assist, HOB elevated, Used rails     General bed mobility comments: minA with increased time, slow movements of LE.    Transfers Overall transfer level: Needs assistance Equipment used: Rolling walker (2 wheels) Transfers: Sit to/from Stand, Bed to chair/wheelchair/BSC Sit to Stand: Mod assist   Step pivot transfers: Min assist       General transfer comment: modA to rise due to poor strength and power in BLE, difficulty releasing bed to reach for RW and dependent on increased trunk support. dizzy with stand and dry heaving but BP stable and no vomiting    Ambulation/Gait Ambulation/Gait assistance: Min assist Gait Distance (Feet): 4 Feet Assistive device: Rolling walker (2 wheels) Gait Pattern/deviations: Step-to pattern, Decreased stride length, Decreased dorsiflexion - left, Decreased weight shift to left Gait velocity: decreased     General Gait Details: small forwards steps from bed to chair, heavy dependence on RW, poor tolerance of stance on LLE due to pain.    Balance Overall balance assessment: Needs assistance Sitting-balance support: No upper  extremity supported Sitting balance-Leahy Scale: Good     Standing balance support: Bilateral upper extremity supported, During functional activity Standing balance-Leahy  Scale: Poor Standing balance comment: limited tolerance, dependent on BUE support                             Pertinent Vitals/Pain Pain Assessment Pain Assessment: Faces Faces Pain Scale: Hurts whole lot Pain Location: LLE wound Pain Descriptors / Indicators: Discomfort, Burning Pain Intervention(s): Limited activity within patient's tolerance, Monitored during session, Premedicated before session, Repositioned    Home Living Family/patient expects to be discharged to:: Private residence Living Arrangements: Other relatives Available Help at Discharge: Family;Available PRN/intermittently (grandson) Type of Home: House Home Access: Stairs to enter Entrance Stairs-Rails: Right;Left;Can reach both Entrance Stairs-Number of Steps: 2 steps   Home Layout: One level Home Equipment: Agricultural Consultant (2 wheels);Cane - single point;Shower seat Additional Comments: Pt's great grandson works 24 hours shifts but is off several days in a row Psychologist, Occupational and EMT).  Pt has other family that checks in also.    Prior Function Prior Level of Function : Independent/Modified Independent             Mobility Comments: Using cane in house, reports limited mobility since d/c, HHPT had not started yet. ADLs Comments: independent with ADLs, uses sponge bath     Extremity/Trunk Assessment   Upper Extremity Assessment Upper Extremity Assessment: Defer to OT evaluation    Lower Extremity Assessment Lower Extremity Assessment: Generalized weakness (limited assessment due to extensive wounds, pt with poor tolerance for touch to LLE, able to move against gravity. reports increased pain with ROM)    Cervical / Trunk Assessment Cervical / Trunk Assessment: Kyphotic  Communication   Communication Communication: No apparent difficulties    Cognition Arousal: Alert Behavior During Therapy: WFL for tasks assessed/performed   PT - Cognitive impairments: No family/caregiver present to determine  baseline, Memory, Awareness, Attention, Problem solving, Safety/Judgement                       PT - Cognition Comments: pt needing repeated cues and often interrupting PT to ask question PT was answering or had just said. no family present to confirm baseline, answering questions approrpiately in session Following commands: Intact       Cueing Cueing Techniques: Verbal cues, Gestural cues     General Comments General comments (skin integrity, edema, etc.): VSS on 4L, SpO2 100% but pt reports SOB. significant wounds on LLE, with dressings intact, red, flaky skin on RLE    Exercises     Assessment/Plan    PT Assessment Patient needs continued PT services  PT Problem List Decreased strength;Decreased activity tolerance;Decreased balance;Decreased mobility;Decreased safety awareness;Cardiopulmonary status limiting activity;Decreased skin integrity       PT Treatment Interventions DME instruction;Gait training;Stair training;Therapeutic exercise;Balance training;Therapeutic activities;Functional mobility training;Patient/family education    PT Goals (Current goals can be found in the Care Plan section)  Acute Rehab PT Goals Patient Stated Goal: to improve mobility and reduce pain PT Goal Formulation: With patient Time For Goal Achievement: 05/25/24 Potential to Achieve Goals: Fair    Frequency Min 2X/week        AM-PAC PT 6 Clicks Mobility  Outcome Measure Help needed turning from your back to your side while in a flat bed without using bedrails?: A Little Help needed moving from lying on your back to sitting on the side of a  flat bed without using bedrails?: A Little Help needed moving to and from a bed to a chair (including a wheelchair)?: A Lot Help needed standing up from a chair using your arms (e.g., wheelchair or bedside chair)?: A Little Help needed to walk in hospital room?: Total Help needed climbing 3-5 steps with a railing? : Total 6 Click Score: 13     End of Session Equipment Utilized During Treatment: Gait belt;Oxygen  Activity Tolerance: Patient limited by fatigue;Patient limited by pain Patient left: in chair;with [name] bell/phone within reach;with chair alarm set Nurse Communication: Mobility status PT Visit Diagnosis: Unsteadiness on feet (R26.81);Muscle weakness (generalized) (M62.81);Pain Pain - Right/Left: Left Pain - part of body: Leg    Time: 9060-8981 PT Time Calculation (min) (ACUTE ONLY): 39 min   Charges:   PT Evaluation $PT Eval Low Complexity: 1 Low PT Treatments $Therapeutic Exercise: 8-22 mins $Therapeutic Activity: 8-22 mins PT General Charges $$ ACUTE PT VISIT: 1 Visit         [name], PT, DPT   Acute Rehabilitation Department Office [phone] Secure Chat Communication Preferred  [name] 05/11/2024, 12:39 PM

## 2024-05-11 NOTE — Progress Notes (Signed)
 Daughter Fergie Sherbert called to speak about patients injection for her anemia that patients receives out patient through oncology clinic.  Patient was due to receive last week and did not and [name] requested we give here in the hospital.  Dr Jillian notified and information relayed.

## 2024-05-11 NOTE — Plan of Care (Signed)
   Problem: Health Behavior/Discharge Planning: Goal: Ability to manage health-related needs will improve Outcome: Progressing

## 2024-05-11 NOTE — Progress Notes (Addendum)
 Pt refused wound care and dressing changes to LLE this morning.

## 2024-05-11 NOTE — Telephone Encounter (Signed)
 Pt's daughter called stating the pt is currently in the hospital and wants to know if Dr Lanny could order for the pt to get Bevacizumab  while the pt is admitted in [facility] like she did when the pt was previously admitted to the hospital.  Stated that Dr Lanny is out of the office this week.  Pt's daughter wants to know if Dr Demetra APP or another Provider could arrange this for the pt.  Stated that normally this practice is not done.  Instructed pt's daughter to speak with the Hospitalist who is covering the pt while admitted to [facility] to discuss this.  Stated the Hospitalist will consult the On-Call Oncologist.  Pt's daughter requested if she could speak with Dr Demetra APP.  Stated that Powell Lessen, NP is currently seeing pt's in clinic and would not be able to speak with her until the end of clinic.  Pt's daughter verbalized understanding and stated she will await the return call.

## 2024-05-11 NOTE — Progress Notes (Signed)
 " PROGRESS NOTE  [name]  FMW:[id number] DOB: [birth year] DOA: 05/08/2024 PCP: [name], MD   Brief Narrative: Patient is a 89 year old female with history of HFpEF, tricuspid regurgitation, moderate mitral/aortic stenosis, pulmonary hypertension, RV failure, ICD placement, permanent A-fib failed antiarrhythmics in the past had multiple cardioversions, oral anticoagulant due to history of recurrent GI bleed, CKD stage IIIb, COPD, chronic hypoxic respiratory failure on 2 to 3 L of oxygen  per minute, hypertension, hyperlipidemia, 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  Cardiology consulted after admission.  Has elevated BNP.  Chest x-ray showed small bilateral pleural effusion.  X-ray of left tibia/femur negative for osteomyelitis.  CTA did not show any acute findings.  Cardiology following, on IV diuresis.  Palliative care consulted for goals of care.  Wound care consulted  Assessment & Plan:  Principal Problem:   Acute on chronic heart failure with preserved ejection fraction (HFpEF) (HCC) Active Problems:   Primary pancreatic neuroendocrine tumor (HCC)   Atrial fibrillation, permanent (HCC)   Leg wound, left   Acute encephalopathy   Acute on chronic HFpEF/severe pulmonary hypertension/right ventricular failure: Followed by advanced heart failure team as an outpatient, not a candidate for pulmonary vasodilators.  She was recently here a month ago for decompensated heart failure, cardiology recommending palliative care/hospice approach.  Palliative care meeting was completed and she refused hospice.  She was discharged on torsemide , metolazone .  Last echo had shown EF of 55 - 60%, grade 3 diastolic dysfunction, moderately reduced right ventricular function, severe elevated pulmonary artery systolic pressure.  Patient appeared volume overloaded on plantation, elevated proBNP.  On 4 L of  oxygen  per minute. Cardiology following.  On IV diuresis: IV Lasix , metolazone   Chronic hypoxic respiratory failure: On 2 to 3 L of oxygen  per minute at baseline.  Currently on 4.  Continue to wean.  Acute encephalopathy : Very confused, oriented to self only on presentation.  Now almost back to baseline.  Urine analysis not suspicious for UTI.  CT head did not show any acute findings.  Blood gas without evidence of hypercarbia.Ammonia, vitamin B12, TSH normal  Large left lower extremity cellulitis/wound: History of large left lower extremity laceration after the fall.  Sutures was placed on 12/28.  She has chronic wounds on her distal leg and dorsum of the foot.  No fever or leukocytosis.  X-ray of the left tibia/shoulder negative for osteomyelitis.  On linezolid , Zosyn .  Wound care consulted.  She was seen by plastic surgery for this on last admission, recommended to continue wound care and finish the antibiotics.  She also follows with outpatient wound care.  Patient does not want any intervention for the left lower extremity wound.  Antibiotics will be changed to oral soon  Permanent A-fib: Failed antiarrhythmics in the past.  Not on chronic anticoagulation due to history of recurrent GI bleed  CKD stage IIIb: Currently kidney function at baseline  History of COPD: Currently stable, not in exacerbation  Coronary artery disease: No anginal symptoms at present  Hypertension:BP  stable.  Hyperlipidemia:On  Crestor   Anemia of chronic disease: Currently hemoglobin stable.  Continue to monitor, transfuse if drops less than 7.  Primary pancreatic neuroendocrine tumor: Follows with oncology, general surgery.  She is not a candidate for surgery due to her age and comorbidities.  She was started on Sandostatin  injection every 28 days on 03/27/22  Goals of care: Elderly patient with multiple  comorbidities.  Very poor prognosis.  CODE STATUS DNR.  Palliative  care consulted again.  Patient wants to  discuss goals of care.        DVT prophylaxis:heparin  injection 5,000 Units Start: 05/09/24 0600     Code Status: Limited: Do not attempt resuscitation (DNR) -DNR-LIMITED -Do Not Intubate/DNI   Family Communication: None at bedside  Patient status:Inpatient  Patient is from :home  Anticipated discharge to:not sure  Estimated DC date:not sure   Consultants: Cardiology,palliative care  Procedures:None  Antimicrobials:  Anti-infectives (From admission, onward)    Start     Dose/Rate Route Frequency Ordered Stop   05/09/24 1000  linezolid  (ZYVOX ) IVPB 600 mg        600 mg 300 mL/hr over 60 Minutes Intravenous Every 12 hours 05/09/24 0547     05/09/24 0600  piperacillin -tazobactam (ZOSYN ) IVPB 3.375 g        3.375 g 12.5 mL/hr over 240 Minutes Intravenous Every 8 hours 05/09/24 0553     05/09/24 0030  piperacillin -tazobactam (ZOSYN ) IVPB 3.375 g        3.375 g 100 mL/hr over 30 Minutes Intravenous  Once 05/09/24 0026 05/09/24 0108   05/09/24 0030  linezolid  (ZYVOX ) IVPB 600 mg        600 mg 300 mL/hr over 60 Minutes Intravenous  Once 05/09/24 0026 05/09/24 0500       Subjective: Patient seen and examined at bedside today.  Sitting at the edge of the bed.  Working with physical therapy.  Complains of pain on the left leg wound.  On 4L of oxygen  per minute.  Denies any worsening shortness of breath or cough.  Objective: Vitals:   05/10/24 2310 05/11/24 0430 05/11/24 0500 05/11/24 0756  BP: (!) 136/37 96/63  126/64  Pulse: 60 65  70  Resp: 18 16    Temp: (!) 97.5 F (36.4 C) 98.1 F (36.7 C)  97.6 F (36.4 C)  TempSrc: Oral Oral  Oral  SpO2: 99% 100%    Weight:   55.7 kg   Height:        Intake/Output Summary (Last 24 hours) at 05/11/2024 1221 Last data filed at 05/11/2024 0900 Gross per 24 hour  Intake 720 ml  Output 500 ml  Net 220 ml   Filed Weights   05/09/24 2201 05/10/24 0310 05/11/24 0500  Weight: 59.3 kg 57.1 kg 55.7 kg     Examination:  General exam: Very deconditioned, chronically ill looking HEENT: PERRL Respiratory system: Crackles on bilateral bases Cardiovascular system: Irregularly irregular rhythm Gastrointestinal system: Abdomen is nondistended, soft and nontender. Central nervous system: Alert and oriented   Extremities: Bilateral trace lower extremity edema, no clubbing ,no cyanosis Skin: Large laceration lesion with sutures on left leg chronic wounds on the distal left leg and dorsum of the left foot( see picture)     Data Reviewed: I have personally reviewed following labs and imaging studies  CBC: Recent Labs  Lab 05/09/24 0025 05/09/24 0136 05/10/24 0333 05/11/24 0254  WBC 7.2  --  6.0 6.7  NEUTROABS 6.0  --   --   --   HGB 7.8* 9.5*  9.5* 7.5* 7.3*  HCT 26.0* 28.0*  28.0* 25.3* 24.3*  MCV 92.2  --  92.7 92.4  PLT 222  --  208 207   Basic Metabolic Panel: Recent Labs  Lab 05/09/24 0025 05/09/24 0136 05/10/24 0006 05/10/24 0333 05/11/24 0254  NA 140 140  139 140  --  135  K  3.6 3.5  3.5 3.9  --  3.4*  CL 103 104 102  --  97*  CO2 23  --  21*  --  24  GLUCOSE 106* 98 103*  --  101*  BUN 69* 70* 73*  --  67*  CREATININE 1.39* 1.50* 1.49*  --  1.59*  CALCIUM  9.3  --  8.7*  --  9.0  MG  --   --   --  1.7  --   PHOS  --   --   --  3.4  --      Recent Results (from the past 240 hours)  Blood Culture (routine x 2)     Status: None (Preliminary result)   Collection Time: 05/09/24 12:25 AM   Specimen: BLOOD  Result Value Ref Range Status   Specimen Description BLOOD RIGHT ANTECUBITAL  Final   Special Requests   Final    BOTTLES DRAWN AEROBIC AND ANAEROBIC Blood Culture adequate volume   Culture   Final    NO GROWTH 2 DAYS Performed at [facility], [address], [place], [place] [address]    Report Status PENDING  Incomplete  Blood Culture (routine x 2)     Status: None (Preliminary result)   Collection Time: 05/09/24  8:55 AM   Specimen: BLOOD   Result Value Ref Range Status   Specimen Description BLOOD RIGHT ANTECUBITAL  Final   Special Requests   Final    BOTTLES DRAWN AEROBIC ONLY Blood Culture results may not be optimal due to an inadequate volume of blood received in culture bottles   Culture   Final    NO GROWTH 2 DAYS Performed at [facility], [address], [place], [place] [address]    Report Status PENDING  Incomplete     Radiology Studies: No results found.   Scheduled Meds:  heparin   5,000 Units Subcutaneous Q8H   metolazone   2.5 mg Oral Once per day on Tuesday Thursday Saturday   rosuvastatin   10 mg Oral Daily   Continuous Infusions:  furosemide  100 mg (05/11/24 1056)   linezolid  (ZYVOX ) IV 600 mg (05/11/24 1032)   piperacillin -tazobactam (ZOSYN )  IV 3.375 g (05/11/24 0631)     LOS: 2 days   Ivonne Mustache, MD [facility] P1/10/2024, 12:21 PM  "

## 2024-05-11 NOTE — Telephone Encounter (Signed)
 Called and spoke with patient's daughter who was in the hospital room with her mom. She is in [facility] [address] [place], room [place]. The patient is currently hospitalized for left leg cellulitis, severe pulmonary hypertension, atrial fib, and right heart failure. The patient had missed her appointment for bevacizumab  infusion last week and the patient's daughter was hoping that we would give the ok to have the infusion given while the patient was inpatient. Reviewing the chart and photos of the open wound on the patient's left lower leg, explained that the patient was not currently a good candidate for IV bevacizumab . The infusion could interfere with wound healing and would likely result in longer hospitalization. The patient's daughter then asked about recommendations for blood transfusion. The patient's hemoglobin is 7.3. she has been told that the patient's hemoglobin would have to drop under 7.0 to be eligible for blood transfusion. Explained with cardiac concerns, it was reasonable for the hospitalist to hold off. Fluid overload and kidney function are also factoring into decision to give or hold blood transfusion. If the hemoglobin dropped under 7.0, the patient would receive blood if there were no contraindications. Her daughter voiced understanding, though, she expressed concern that anemia would also cause severe problems. I reassured her that the hospitalist would continue to monitor her mother closely and that blood transfusion would be administered under the correct circumstances. The cardiologist did recommend hospice care for the patient. The patient continues to decline hospice or palliative care services at this time.  -Powell Lessen, NP

## 2024-05-12 DIAGNOSIS — I5033 Acute on chronic diastolic (congestive) heart failure: Secondary | ICD-10-CM | POA: Diagnosis not present

## 2024-05-12 LAB — CBC
HCT: 22.7 % — ABNORMAL LOW (ref 36.0–46.0)
Hemoglobin: 6.9 g/dL — CL (ref 12.0–15.0)
MCH: 27.9 pg (ref 26.0–34.0)
MCHC: 30.4 g/dL (ref 30.0–36.0)
MCV: 91.9 fL (ref 80.0–100.0)
Platelets: 193 K/uL (ref 150–400)
RBC: 2.47 MIL/uL — ABNORMAL LOW (ref 3.87–5.11)
RDW: 20 % — ABNORMAL HIGH (ref 11.5–15.5)
WBC: 4 K/uL (ref 4.0–10.5)
nRBC: 0 % (ref 0.0–0.2)

## 2024-05-12 LAB — POTASSIUM: Potassium: 4.5 mmol/L (ref 3.5–5.1)

## 2024-05-12 LAB — BASIC METABOLIC PANEL WITH GFR
Anion gap: 9 (ref 5–15)
BUN: 61 mg/dL — ABNORMAL HIGH (ref 8–23)
CO2: 32 mmol/L (ref 22–32)
Calcium: 9 mg/dL (ref 8.9–10.3)
Chloride: 94 mmol/L — ABNORMAL LOW (ref 98–111)
Creatinine, Ser: 1.53 mg/dL — ABNORMAL HIGH (ref 0.44–1.00)
GFR, Estimated: 32 mL/min — ABNORMAL LOW
Glucose, Bld: 118 mg/dL — ABNORMAL HIGH (ref 70–99)
Potassium: 3.2 mmol/L — ABNORMAL LOW (ref 3.5–5.1)
Sodium: 135 mmol/L (ref 135–145)

## 2024-05-12 LAB — MAGNESIUM: Magnesium: 2.3 mg/dL (ref 1.7–2.4)

## 2024-05-12 LAB — PREPARE RBC (CROSSMATCH)

## 2024-05-12 MED ORDER — POTASSIUM CHLORIDE CRYS ER 20 MEQ PO TBCR
40.0000 meq | EXTENDED_RELEASE_TABLET | Freq: Four times a day (QID) | ORAL | Status: AC
  Administered 2024-05-12 (×2): 40 meq via ORAL
  Filled 2024-05-12 (×2): qty 2

## 2024-05-12 MED ORDER — ONDANSETRON HCL 4 MG/2ML IJ SOLN
4.0000 mg | Freq: Once | INTRAMUSCULAR | Status: DC
  Filled 2024-05-12: qty 2

## 2024-05-12 MED ORDER — SODIUM CHLORIDE 0.9% IV SOLUTION: Freq: Once | INTRAVENOUS | Status: DC

## 2024-05-12 MED ORDER — DIPHENHYDRAMINE HCL 50 MG/ML IJ SOLN
50.0000 mg | Freq: Once | INTRAMUSCULAR | Status: AC
  Administered 2024-05-12: 50 mg via INTRAVENOUS
  Filled 2024-05-12: qty 1

## 2024-05-12 MED ORDER — FAMOTIDINE 20 MG PO TABS
20.0000 mg | ORAL_TABLET | Freq: Every day | ORAL | Status: DC
  Administered 2024-05-12 – 2024-05-14 (×3): 20 mg via ORAL
  Filled 2024-05-12 (×3): qty 1

## 2024-05-12 MED ORDER — METHYLPREDNISOLONE SODIUM SUCC 40 MG IJ SOLR
40.0000 mg | Freq: Once | INTRAMUSCULAR | Status: AC
  Administered 2024-05-12: 40 mg via INTRAVENOUS
  Filled 2024-05-12: qty 1

## 2024-05-12 MED ORDER — ACETAMINOPHEN 325 MG PO TABS
650.0000 mg | ORAL_TABLET | Freq: Once | ORAL | Status: AC
  Administered 2024-05-12: 650 mg via ORAL
  Filled 2024-05-12: qty 2

## 2024-05-12 MED ORDER — DARBEPOETIN ALFA 25 MCG/0.42ML IJ SOSY
25.0000 ug | PREFILLED_SYRINGE | INTRAMUSCULAR | Status: DC
  Administered 2024-05-12: 25 ug via SUBCUTANEOUS
  Filled 2024-05-12: qty 0.42

## 2024-05-12 NOTE — Plan of Care (Signed)
   Problem: Education: Goal: Knowledge of General Education information will improve Description: Including pain rating scale, medication(s)/side effects and non-pharmacologic comfort measures Outcome: Progressing   Problem: Coping: Goal: Level of anxiety will decrease Outcome: Progressing

## 2024-05-12 NOTE — Evaluation (Signed)
 Occupational Therapy Evaluation Patient Details Name: [name] MRN: [id number] DOB: 1934/01/05 Today's Date: 05/12/2024   History of Present Illness   The pt is an 89 yo female presenting 1/3 with infection of LLE wound (admitted 11/28-12/8 after a fall with LE wounds). Work up also revealed SOB and AMS. Admitted for management of CHF exacerbation and cellulitis. 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     Clinical Impressions Pt was using a cane recently when at home then had significant decrease in mobility. Pt at this time reporting the do not want to get OOB but agreeable to attempt what they can at bed level. She was able to complete UE dressing/bathing post set up with pacing and then reported they needed to have BM. Pt declining at this time for OOB was placed on bed pan and nursing was made aware. Patient will benefit from continued inpatient follow up therapy, <3 hours/day.      If plan is discharge home, recommend the following:   Assistance with cooking/housework;Assist for transportation;A lot of help with walking and/or transfers;A lot of help with bathing/dressing/bathroom     Functional Status Assessment   Patient has had a recent decline in their functional status and demonstrates the ability to make significant improvements in function in a reasonable and predictable amount of time.     Equipment Recommendations    (TBD at next venue)     Recommendations for Other Services         Precautions/Restrictions   Precautions Precautions: Fall Recall of  Precautions/Restrictions: Intact Precaution/Restrictions Comments: 3L, LE wounds Restrictions Weight Bearing Restrictions Per Provider Order: No     Mobility Bed Mobility Overal bed mobility: Needs Assistance Bed Mobility: Rolling Rolling: Contact guard assist         General bed mobility comments: requested pt to complete long sitting and was able to complete with just use of bed rail    Transfers                   General transfer comment: pt decline      Balance                                           ADL either performed or assessed with clinical judgement   ADL Overall ADL's : Needs assistance/impaired Eating/Feeding: Independent;Sitting   Grooming: Wash/dry face;Set up;Bed level;Sitting   Upper Body Bathing: Set up;Sitting       Upper Body Dressing : Set up;Sitting;Bed level                           Vision         Perception         Praxis         Pertinent Vitals/Pain Pain Assessment Pain Assessment: Faces Faces Pain Scale: Hurts even more Pain Location: LLE wound Pain Descriptors / Indicators: Discomfort, Burning Pain Intervention(s): Limited activity within patient's tolerance, Monitored during session, Repositioned  Extremity/Trunk Assessment Upper Extremity Assessment Upper Extremity Assessment: Generalized weakness   Lower Extremity Assessment Lower Extremity Assessment: Defer to PT evaluation   Cervical / Trunk Assessment Cervical / Trunk Assessment: Kyphotic   Communication Communication Communication: No apparent difficulties   Cognition Arousal: Alert Behavior During Therapy: WFL for tasks assessed/performed Cognition: No apparent impairments                               Following commands: Intact       Cueing  General Comments   Cueing Techniques: Verbal cues;Gestural cues      Exercises     Shoulder Instructions      Home Living Family/patient  expects to be discharged to:: Private residence Living Arrangements: Other relatives (grandson) Available Help at Discharge: Family;Available PRN/intermittently Type of Home: House Home Access: Stairs to enter [employer] of Steps: 2 steps Entrance Stairs-Rails: Right;Left;Can reach both Home Layout: One level     Bathroom Shower/Tub: Tub/shower unit;Sponge bathes at baseline   [employer]: Handicapped height Bathroom Accessibility: Yes   Home Equipment: Agricultural Consultant (2 wheels);Cane - single point;Shower seat   Additional Comments: Pt's great grandson works 24 hours shifts but is off several days in a row Psychologist, Occupational and EMT).  Pt has other family that checks in also.      Prior Functioning/Environment Prior Level of Function : Independent/Modified Independent             Mobility Comments: Using cane in house, reports limited mobility since d/c, HHPT had not started yet. ADLs Comments: independent with ADLs, uses sponge bath    OT Problem List: Decreased strength;Decreased activity tolerance;Impaired balance (sitting and/or standing);Decreased knowledge of use of DME or AE;Decreased safety awareness;Pain   OT Treatment/Interventions: Self-care/ADL training;Therapeutic exercise;Therapeutic activities;Patient/family education;Balance training      OT Goals(Current goals can be found in the care plan section)   Acute Rehab OT Goals Patient Stated Goal: to go to SNF OT Goal Formulation: With patient Time For Goal Achievement: 05/26/24 Potential to Achieve Goals: Fair   OT Frequency:  Min 2X/week    Co-evaluation              AM-PAC OT 6 Clicks Daily Activity     Outcome Measure Help from another person eating meals?: None Help from another person taking care of personal grooming?: A Little Help from another person toileting, which includes using toliet, bedpan, or urinal?: A Lot Help from another person bathing (including washing, rinsing,  drying)?: A Lot Help from another person to put on and taking off regular upper body clothing?: A Little Help from another person to put on and taking off regular lower body clothing?: A Lot 6 Click Score: 16   End of Session Nurse Communication: Mobility status (on bed pan)  Activity Tolerance: Patient limited by fatigue Patient left: in bed;with call bell/phone within reach;with bed alarm set  OT Visit Diagnosis: Unsteadiness on feet (R26.81);Other abnormalities of gait and mobility (R26.89);Muscle weakness (generalized) (M62.81)                Time: 9195-9177 OT Time Calculation (min): 18 min Charges:  OT General Charges $OT Visit: 1 Visit OT Evaluation $OT Eval Low Complexity: 1 Low  Warrick POUR OTR/L  Acute Rehab Services  [phone] office number   Warrick Berber 05/12/2024, 8:29 AM

## 2024-05-12 NOTE — Care Plan (Addendum)
 Low hemoglobin 6.9.  Patient is not having any active GI bleed History of atrial fibrillation however not on anticoagulation due to previous history of GI bleed.  -Verified with patient with bedside with patient nurse and patient agreeable with blood transfusion. - Transfusing 1 unit of blood.  Mycah Formica, MD [facility] 05/12/2024, 3:53 AM     Transfusion related reaction-vomiting and itching Patient reported that previously during blood transfusion patient developed nausea itchiness and vomiting and patient reported that usually she received Tylenol , Zofran , steroid and Benadryl  30-minute before blood transfusion.  All the order has been placed.  Update,  spoke with patient's granddaughter Bari over phone. She was worried for blood transfusion as patient has CHF, I told her that patient already receiving high dose of Lasix  100 mg twice daily so she will not be volume overloaded.  Ahria Slappey, MD [facility] 05/12/2024, 4:49 AM

## 2024-05-12 NOTE — Plan of Care (Signed)
   Problem: Activity: Goal: Risk for activity intolerance will decrease Outcome: Progressing

## 2024-05-12 NOTE — Progress Notes (Signed)
 " PROGRESS NOTE  [name]  FMW:[id number] DOB: [birth year] DOA: 05/08/2024 PCP: [name], MD   Brief Narrative: Patient is a 89 year old female with history of HFpEF, tricuspid regurgitation, moderate mitral/aortic stenosis, pulmonary hypertension, RV failure, ICD placement, permanent A-fib failed antiarrhythmics in the past had multiple cardioversions, oral anticoagulant due to history of recurrent GI bleed, CKD stage IIIb, COPD, chronic hypoxic respiratory failure on 2 to 3 L of oxygen  per minute, hypertension, hyperlipidemia, 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  Cardiology consulted after admission.  Has elevated BNP.  Chest x-ray showed small bilateral pleural effusion.  X-ray of left tibia/femur negative for osteomyelitis.  CTA did not show any acute findings.  Cardiology following, on IV diuresis.  Palliative care consulted for goals of care.  Wound care consulted.  Plan is for SNF after adequate diuresis.  Assessment & Plan:  Principal Problem:   Acute on chronic heart failure with preserved ejection fraction (HFpEF) (HCC) Active Problems:   Primary pancreatic neuroendocrine tumor (HCC)   Atrial fibrillation, permanent (HCC)   Leg wound, left   Acute encephalopathy   Acute on chronic HFpEF/severe pulmonary hypertension/right ventricular failure: Followed by advanced heart failure team as an outpatient, not a candidate for pulmonary vasodilators.  She was recently here a month ago for decompensated heart failure, cardiology recommending palliative care/hospice approach.  Palliative care meeting was completed and she refused hospice.  She was discharged on torsemide , metolazone .  Last echo had shown EF of 55 - 60%, grade 3 diastolic dysfunction, moderately reduced right ventricular function, severe elevated pulmonary artery systolic pressure.  Patient appeared volume overloaded  on presentation, elevated proBNP.  On 4 L of oxygen  per minute. Cardiology following.  On IV diuresis.  Chronic hypoxic respiratory failure: On 2 to 3 L of oxygen  per minute at baseline.  Currently on the same  Acute encephalopathy : Very confused, oriented to self only on presentation.  Now almost back to baseline.  Urine analysis not suspicious for UTI.  CT head did not show any acute findings.  Blood gas without evidence of hypercarbia.Ammonia, vitamin B12, TSH normal  Large left lower extremity cellulitis/wound: History of large left lower extremity laceration after the fall.  Sutures was placed on 12/28.  She has chronic wounds on her distal leg and dorsum of the foot.  No fever or leukocytosis.  X-ray of the left tibia/shoulder negative for osteomyelitis.  On linezolid , Zosyn .  Wound care consulted.  She was seen by plastic surgery for this on last admission, recommended to continue wound care and finish the antibiotics.  She also follows with outpatient wound care.  Patient does not want any intervention for the left lower extremity wound.  Antibiotics will be changed to oral on discharge.  No evidence of ongoing worsening of infection.  Permanent A-fib: Failed antiarrhythmics in the past.  Not on chronic anticoagulation due to history of recurrent GI bleed  CKD stage IIIb: Currently kidney function at baseline  History of COPD: Currently stable, not in exacerbation  Coronary artery disease: No anginal symptoms at present  Hypertension:BP  stable.  Hyperlipidemia:On  Crestor   Anemia of chronic disease: Hemoglobin down to the range of 6 today.  Being transfused a unit of PRBC.  Also ordered dose of aranesp   Primary pancreatic neuroendocrine tumor: Follows with oncology, general surgery.  She is not a candidate for surgery due to her age and comorbidities.  She was started on Sandostatin  injection every 28 days on 03/27/22  Goals of care: Elderly patient with multiple comorbidities.   Very poor prognosis.  CODE STATUS DNR.  Palliative  care consulted again.  Patient does not feel ready for hospice yet.  I had a long discussion with patient's daughter [name] about her poor prognosis.  She  understands        DVT prophylaxis:heparin  injection 5,000 Units Start: 05/09/24 0600     Code Status: Limited: Do not attempt resuscitation (DNR) -DNR-LIMITED -Do Not Intubate/DNI   Family Communication: Called and discussed with daughter [name] on phone on 1/7  Patient status:Inpatient  Patient is from :home  Anticipated discharge to: SNF  Estimated DC date: Adequate diuresis, cardiology clearance  Consultants: Cardiology,palliative care  Procedures:None  Antimicrobials:  Anti-infectives (From admission, onward)    Start     Dose/Rate Route Frequency Ordered Stop   05/09/24 1000  linezolid  (ZYVOX ) IVPB 600 mg        600 mg 300 mL/hr over 60 Minutes Intravenous Every 12 hours 05/09/24 0547     05/09/24 0600  piperacillin -tazobactam (ZOSYN ) IVPB 3.375 g        3.375 g 12.5 mL/hr over 240 Minutes Intravenous Every 8 hours 05/09/24 0553     05/09/24 0030  piperacillin -tazobactam (ZOSYN ) IVPB 3.375 g        3.375 g 100 mL/hr over 30 Minutes Intravenous  Once 05/09/24 0026 05/09/24 0108   05/09/24 0030  linezolid  (ZYVOX ) IVPB 600 mg        600 mg 300 mL/hr over 60 Minutes Intravenous  Once 05/09/24 0026 05/09/24 0500       Subjective: Patient seen and examined at bedside today.  Hemodynamically stable.  Still on IV diuresis.  On baseline oxygen  requirement.  Complains of being tired today.  Remains alert and oriented.  Looks very weak and deconditioned  Objective: Vitals:   05/11/24 2319 05/12/24 0345 05/12/24 0500 05/12/24 0724  BP: (!) 144/56 (!) 123/44  (!) 122/51  Pulse: 60 60  60  Resp: 16 15  17   Temp: 97.6 F (36.4 C) 97.6 F (36.4 C)  97.8 F (36.6 C)  TempSrc: Oral Oral  Oral  SpO2: 90% 96%  99%  Weight:   56.1 kg   Height:        Intake/Output  Summary (Last 24 hours) at 05/12/2024 1140 Last data filed at 05/12/2024 0538 Gross per 24 hour  Intake 1981.09 ml  Output 1900 ml  Net 81.09 ml   Filed Weights   05/10/24 0310 05/11/24 0500 05/12/24 0500  Weight: 57.1 kg 55.7 kg 56.1 kg    Examination:  General exam: Chronically ill looking, weak, deconditioned HEENT: PERRL Respiratory system: Crackles on bilateral bases Cardiovascular system: Irregularly irregular rhythm Gastrointestinal system: Abdomen is nondistended, soft and nontender. Central nervous system: Alert and oriented Extremities: Trace bilateral lower extremity pitting edema Skin: Large laceration lesion with sutures on left leg chronic wounds on the distal left leg and dorsum of the left foot( see picture)     Data Reviewed: I have personally reviewed following labs and imaging studies  CBC: Recent Labs  Lab 05/09/24 0025 05/09/24 0136 05/10/24 0333 05/11/24 0254 05/12/24 0255  WBC 7.2  --  6.0 6.7 4.0  NEUTROABS 6.0  --   --   --   --   HGB 7.8* 9.5*  9.5* 7.5* 7.3* 6.9*  HCT 26.0* 28.0*  28.0* 25.3* 24.3* 22.7*  MCV 92.2  --  92.7 92.4 91.9  PLT 222  --  208 207 193   Basic Metabolic Panel: Recent Labs  Lab 05/09/24 0025 05/09/24 0136 05/10/24 0006 05/10/24 0333 05/11/24 0254 05/12/24 0255  NA 140 140  139 140  --  135 135  K 3.6 3.5  3.5 3.9  --  3.4* 3.2*  CL 103 104 102  --  97* 94*  CO2 23  --  21*  --  24 32  GLUCOSE 106* 98 103*  --  101* 118*  BUN 69* 70* 73*  --  67* 61*  CREATININE 1.39* 1.50* 1.49*  --  1.59* 1.53*  CALCIUM  9.3  --  8.7*  --  9.0 9.0  MG  --   --   --  1.7  --  2.3  PHOS  --   --   --  3.4  --   --      Recent Results (from the past 240 hours)  Blood Culture (routine x 2)     Status: None (Preliminary result)   Collection Time: 05/09/24 12:25 AM   Specimen: BLOOD  Result Value Ref Range Status   Specimen Description BLOOD RIGHT ANTECUBITAL  Final   Special Requests   Final    BOTTLES DRAWN AEROBIC  AND ANAEROBIC Blood Culture adequate volume   Culture   Final    NO GROWTH 3 DAYS Performed at [facility], [address], [place], [place] [address]    Report Status PENDING  Incomplete  Blood Culture (routine x 2)     Status: None (Preliminary result)   Collection Time: 05/09/24  8:55 AM   Specimen: BLOOD  Result Value Ref Range Status   Specimen Description BLOOD RIGHT ANTECUBITAL  Final   Special Requests   Final    BOTTLES DRAWN AEROBIC ONLY Blood Culture results may not be optimal due to an inadequate volume of blood received in culture bottles   Culture   Final    NO GROWTH 3 DAYS Performed at [facility], [address] [place], [place] [address]    Report Status PENDING  Incomplete     Radiology Studies: No results found.   Scheduled Meds:  sodium chloride    Intravenous Once   acetaminophen   650 mg Oral Once   diphenhydrAMINE   50 mg Intravenous Once   famotidine   20 mg Oral Daily   fluticasone   1 spray Each Nare Daily   heparin   5,000 Units Subcutaneous Q8H   methylPREDNISolone  (SOLU-MEDROL ) injection  40 mg Intravenous Once   metolazone   2.5 mg Oral Once per day on Tuesday Thursday Saturday   ondansetron  (ZOFRAN ) IV  4 mg Intravenous Once   rosuvastatin   10 mg Oral Daily   Continuous Infusions:  furosemide  100 mg (05/12/24 0906)   linezolid  (ZYVOX ) IV 600 mg (05/12/24 0902)   piperacillin -tazobactam (ZOSYN )  IV 3.375 g (05/12/24 0521)     LOS: 3 days   Ivonne Mustache, MD [facility] P1/11/2024, 11:40 AM  "

## 2024-05-12 NOTE — NC FL2 (Signed)
 " [place]  MEDICAID FL2 LEVEL OF CARE FORM     IDENTIFICATION  Patient Name: [name] Birthdate: 1933/09/29 Sex: female Admission Date (Current Location): 05/08/2024  [facility] and [place] Number:  Producer, Television/film/video and Address:  The [place], [address] [place], [place] [address]      Provider Number: [id number]  Attending Physician Name and Address:  Jillian Buttery, MD  Relative Name and Phone Number:  Teachey,[name]  Daughter,  [phone]    Current Level of Care: Hospital Recommended Level of Care: Skilled Nursing Facility Prior Approval Number:    Date Approved/Denied:   PASRR Number: [id number]  Discharge Plan: SNF    Current Diagnoses: Patient Active Problem List   Diagnosis Date Noted   Leg wound, left 05/09/2024   Acute encephalopathy 05/09/2024   Aortic valve stenosis 04/04/2024   Nonrheumatic mitral valve stenosis 04/04/2024   Acute on chronic right-sided heart failure (HCC) 04/04/2024   Acute on chronic heart failure with preserved ejection fraction (HFpEF) (HCC) 04/02/2024   Protein-calorie malnutrition, severe 01/14/2024   MRSA (methicillin resistant staph aureus) culture positive 01/12/2024   History of bacteremia 09/16/2023   Pulmonary hypertension due to COPD (HCC) 09/06/2023   Sepsis due to cellulitis (HCC) 09/03/2023   Presence of heart assist device (HCC) 08/30/2023   Medication management 08/29/2023   Cellulitis 08/26/2023   FTT (failure to thrive) in adult 06/24/2023   Venous stasis ulcer of left lower leg with edema of left lower leg (HCC) 06/18/2023   (HFpEF) heart failure with preserved ejection fraction (HCC)    Renal cell carcinoma s/p nephrectomy 2003(HCC) 11/25/2022   Herpes zoster 09/02/2022   Decreased hearing of right ear 07/25/2022   DNR (do not resuscitate) 07/25/2022   Bilateral impacted cerumen 05/20/2022   Chronic hypoxic respiratory failure, on home oxygen  therapy (HCC) 04/03/2022    Primary pancreatic neuroendocrine tumor (HCC) 03/20/2022   Osteoporosis 10/11/2021   Squamous cell carcinoma of foot, 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   Chronic gout due to renal impairment 01/30/2021   History of blood transfusion reaction 04/17/2020   Angiodysplasia of small intestine    Gastric polyps    GI bleed 03/28/2020   Palliative care by specialist    Advanced directives, counseling/discussion    Normocytic anemia 01/06/2020   History of gastritis 11/11/2019   Chronic venous hypertension w ulceration (HCC)    Black stools    Chronic bilateral pleural effusions 11/04/2018   Iliac artery stenosis, bilateral 07/14/2017   Chronic blood loss anemia 11/18/2016   Severe pulmonary hypertension (HCC) 12/04/2015   B12 deficiency 05/22/2015   PAD (peripheral artery disease)    History of nephrectomy secondary to RCC    TIA (transient ischemic attack) 08/16/2014   Other fatigue 01/14/2014   Chronic diastolic CHF (congestive heart failure) (HCC) 06/04/2013   Valvular heart disease 06/04/2013   Allergic rhinitis 01/01/2013   Chronic cholecystitis with calculus 09/15/2012   Claudication 01/21/2012   Hypertension 05/20/2011   HOCM/IHSS 07/14/2009   Hyperlipidemia 04/10/2009   INCONTINENCE, URGE 04/10/2009   NICM (nonischemic cardiomyopathy) (HCC) 10/27/2008   Atrial fibrillation, permanent (HCC) 10/27/2008   SINUS BRADYCARDIA 10/27/2008   Carotid artery stenosis 10/27/2008   COPD (chronic obstructive pulmonary disease) (HCC) 10/27/2008   CKD stage 3b, GFR 30-44 ml/min (HCC) 10/27/2008   ICD (implantable cardioverter-defibrillator) in place 10/27/2008    Orientation RESPIRATION BLADDER Height & Weight     Self, Time, Situation, Place  O2 (3L O2 [place]) External catheter,  Incontinent Weight: 123 lb 10.9 oz (56.1 kg) Height:  5' 6 (167.6 cm)  BEHAVIORAL SYMPTOMS/MOOD NEUROLOGICAL BOWEL NUTRITION STATUS      Incontinent Diet (Please see discharge summary)  AMBULATORY STATUS COMMUNICATION OF NEEDS  Skin   Extensive Assist Verbally Other (Comment) (Wound-Traumatic Leg Left;Lateral;Lower)                       Personal Care Assistance Level of Assistance  Bathing, Feeding, Dressing Bathing Assistance: Maximum assistance Feeding assistance: Limited assistance Dressing Assistance: Maximum assistance     Functional Limitations Info  Sight, Hearing, Speech Sight Info: Adequate Hearing Info: Adequate Speech Info: Adequate    SPECIAL CARE FACTORS FREQUENCY  PT (By licensed PT), OT (By licensed OT)     PT Frequency: 5x week OT Frequency: 5x week            Contractures Contractures Info: Not present    Additional Factors Info  Code Status, Allergies Code Status Info: DNR limited Allergies Info: Codeine, Dilaudid  (Hydromorphone ), Tape, Amoxil  (Amoxicillin ), Asa (Aspirin ), Ms Contin  (Morphine ), Neurontin  (Gabapentin ), Nsaids           Current Medications (05/12/2024):  This is the current hospital active medication list Current Facility-Administered Medications  Medication Dose Route Frequency Provider Last Rate Last Admin   0.9 %  sodium chloride  infusion (Manually program via Guardrails IV Fluids)   Intravenous Once Sundil, Subrina, MD       acetaminophen  (TYLENOL ) tablet 650 mg  650 mg Oral Q6H PRN Rathore, Vasundhra, MD   650 mg at 05/11/24 1412   Or   acetaminophen  (TYLENOL ) suppository 650 mg  650 mg Rectal Q6H PRN Alfornia Madison, MD       acetaminophen  (TYLENOL ) tablet 650 mg  650 mg Oral Once Sundil, Subrina, MD       diphenhydrAMINE  (BENADRYL ) injection 50 mg  50 mg Intravenous Once Sundil, Subrina, MD       fluticasone  (FLONASE ) 50 MCG/ACT nasal spray 1 spray  1 spray Each Nare Daily Adhikari, Amrit, MD       furosemide  (LASIX ) 100 mg in dextrose  5 % 50 mL IVPB  100 mg Intravenous Q12H Alfornia Madison, MD   Stopped at 05/11/24 2329   heparin  injection 5,000 Units  5,000 Units Subcutaneous Q8H Alfornia Madison, MD   5,000 Units at 05/12/24 0546    ipratropium-albuterol  (DUONEB) 0.5-2.5 (3) MG/3ML nebulizer solution 3 mL  3 mL Nebulization Q6H PRN Alfornia Madison, MD       linezolid  (ZYVOX ) IVPB 600 mg  600 mg Intravenous Q12H Alfornia Madison, MD   Stopped at 05/11/24 2328   methylPREDNISolone  sodium succinate (SOLU-MEDROL ) 40 mg/mL injection 40 mg  40 mg Intravenous Once Sundil, Subrina, MD       metolazone  (ZAROXOLYN ) tablet 2.5 mg  2.5 mg Oral Once per day on Tuesday Thursday Saturday Alfornia Madison, MD   2.5 mg at 05/11/24 1038   ondansetron  (ZOFRAN ) injection 4 mg  4 mg Intravenous Q6H PRN Jillian Buttery, MD       ondansetron  (ZOFRAN ) injection 4 mg  4 mg Intravenous Once Sundil, Subrina, MD       piperacillin -tazobactam (ZOSYN ) IVPB 3.375 g  3.375 g Intravenous Q8H Bryk, Veronda P, RPH 12.5 mL/hr at 05/12/24 0521 3.375 g at 05/12/24 0521   potassium chloride  SA (KLOR-CON  M) CR tablet 40 mEq  40 mEq Oral Q6H Adhikari, Amrit, MD       rosuvastatin  (CRESTOR ) tablet 10 mg  10 mg Oral  Daily Rathore, Vasundhra, MD   10 mg at 05/11/24 1038   traMADol  (ULTRAM ) tablet 50 mg  50 mg Oral TID PRN Leotis Bogus, MD   50 mg at 05/12/24 9562     Discharge Medications: Please see discharge summary for a list of discharge medications.  Relevant Imaging Results:  Relevant Lab Results:   Additional Information SSN: [id number]  [place] Rothgeb C Lenzy Kerschner, LCSWA     "

## 2024-05-12 NOTE — Progress Notes (Signed)
 "  Progress Note  Patient Name: [name] Date of Encounter: 05/12/2024 [facility] Cardiologist: Evalene Lunger, MD   Interval Summary   No change in her symptoms. No change in shortness of breath  Vital Signs Vitals:   05/11/24 2319 05/12/24 0345 05/12/24 0500 05/12/24 0724  BP: (!) 144/56 (!) 123/44  (!) 122/51  Pulse: 60 60  60  Resp: 16 15  17   Temp: 97.6 F (36.4 C) 97.6 F (36.4 C)  97.8 F (36.6 C)  TempSrc: Oral Oral  Oral  SpO2: 90% 96%  99%  Weight:   56.1 kg   Height:        Intake/Output Summary (Last 24 hours) at 05/12/2024 1106 Last data filed at 05/12/2024 0538 Gross per 24 hour  Intake 1981.09 ml  Output 1900 ml  Net 81.09 ml      05/12/2024    5:00 AM 05/11/2024    5:00 AM 05/10/2024    3:10 AM  Last 3 Weights  Weight (lbs) 123 lb 10.9 oz 122 lb 12.7 oz 125 lb 14.1 oz  Weight (kg) 56.1 kg 55.7 kg 57.1 kg      Telemetry/ECG  AF with intermittent V pacing. PVCs, 2 episodes of NSVT 3 beats HR avg 60 - Personally Reviewed  Physical Exam  GEN: No acute distress.   Neck: JVD Cardiac: Irregularly, irregular, 4/6 systolic murmur  Respiratory: Clear to auscultation bilaterally. GI: Soft, nontender, non-distended  MS: Appears edematous with overlying erythema, dry skin with crusting. Dressing appears clean and dry.   Assessment & Plan  [name] is a 89 y.o. female with a hx of pancreatic neuroendocrine tumor, chronic respiratory failure on home O2, permanent A-fib off of anticoagulation due to GI bleed, chronic diastolic heart failure, chronic RV failure with pulmonary venous hypertension, stage IV CKD, remote NICM s/p Medtronic ICD, history of renal cell carcinoma s/pt right nephrectomy who presented to the ED for confusion, worsening shortness of breath and concern for infection of her left lower extremity wound. CXR showed volume overload. Pro BNP 9242. Lactic Acid 2.6 -> 2.0.   Acute on chronic diastolic heart failure RV Failure On  admission was noted to be significantly volume overloaded.  Renal function at baseline.  Palliative care consult yesterday, patient shared not ready to transition.   Weight similar to yesterday, suspect dry weight Set to receive blood transfusion today as described below On exam appears most likely  evoleumic, suspect JVD is due to her RV failure    Continue lasix  100 mg BID [similar to her home PTA torsemide  100mg  BID] Continue metolazone  2.5 mg Tues, Thurs, Sat. Continue potassium repletion with use. K today 3.2 repeat lab this afternoon ordered.  SGLT2i deferred with chronic wounds Spironolactone  deferred with hypotension   Anemia  Hgb 6.9 today,  Patient set to get transfusion today Per primary   Hypotension-improved BP: 122/51   Lactic Acidosis-Resolved.    Permanent Atrial Fibrillation Not on chronic anticoagulation 2/2 hx of GI bleed, see anemia above  Currently in rate controlled AF   Severe pulmonary hypertension Followed by advanced heart failure. Not a candidate for pulmonary vasodilator    Valvular Heart Disease Not a candidate for repair/replacement   Per primary Chronic leg wounds on broad spectrum antibiotics CKD Pancreatic neuroendocrine tumor Chronic respiratory failure with COPD Acute encephalopathy    For questions or updates, please contact [facility] Please consult [email or web address] for contact info under  Signed, Leontine LOISE Salen, PA-C   "

## 2024-05-12 NOTE — TOC Progression Note (Addendum)
 Transition of Care [facility]) - Progression Note    Patient Details  Name: [name] MRN: [id number] Date of Birth: Oct 26, 1933  Transition [facility]) CM/SW Contact  [name], [place] Phone Number: 05/12/2024, 9:01 AM  Clinical Narrative:   CSW spoke with [name], pts daughter, about PT rec for STR. [name] stated she would like patient to go to [facility] and rehab if possible. CSW received permission to fax patient out to other facilities as well. [name] stated potentially [facility] or [facility] would be options if [facility] cannot accept. [name] would like for CSW continue any further communication with her DIL, [name] [phone].   10:31 AM Per Darrien, AD at [facility], stated that patient is in copay days and would have to pay $218/day copay for 2 weeks upfront. CSW called [name] to inform her. Per Krisit, family most likely will not be able to afford the copay.  4:25 PM CSW called [name] but was unable to reach her at this time. No voicemail box is setup at this time. CSW to follow up.  4:26 PM [name] called CSW back and informed family cannot afford SNF at this time. [name] stated they cannot afford SNF at this time as patient is in copay days. Family is trying to find a sitter/someone to watch patient when family isnt there and create a schedule amongst themselves. CSW notified MD and RNC at 5:14 PM.  CSW will continue to follow.     Expected Discharge Plan: Skilled Nursing Facility Barriers to Discharge: Continued Medical Work up, SNF Pending bed offer, English As A Second Language Teacher               Expected Discharge Plan and Services In-house Referral: Clinical Social Work   Post Acute Care Choice: Skilled Nursing Facility Living arrangements for the past 2 months: Single Family Home                                       Social Drivers of Health (SDOH) Interventions SDOH Screenings   Food Insecurity: No Food Insecurity (05/10/2024)   Housing: Low Risk (05/10/2024)  Transportation Needs: No Transportation Needs (05/10/2024)  Utilities: Not At Risk (05/10/2024)  Alcohol Screen: Low Risk (11/06/2023)  Depression (PHQ2-9): Low Risk (04/21/2024)  Financial Resource Strain: Low Risk (11/06/2023)  Physical Activity: Insufficiently Active (11/06/2023)  Social Connections: Socially Isolated (05/10/2024)  Stress: No Stress Concern Present (11/06/2023)  Tobacco Use: Medium Risk (05/08/2024)  Health Literacy: Adequate Health Literacy (11/06/2023)    Readmission Risk Interventions    05/10/2024    4:15 PM 06/17/2023   10:07 AM 01/31/2023    3:53 PM  Readmission Risk Prevention Plan  Transportation Screening Complete Complete Complete  HRI or Home Care Consult   Complete  Palliative Care Screening   Not Applicable  Medication Review (RN Care Manager) Referral to Pharmacy  Complete  PCP or Specialist appointment within 3-5 days of discharge Complete    HRI or Home Care Consult Complete Complete   SW Recovery Care/Counseling Consult Complete Complete   Palliative Care Screening Not Applicable Not Applicable   Skilled Nursing Facility Not Applicable Not Applicable

## 2024-05-12 NOTE — Progress Notes (Signed)
 Mobility Specialist Progress Note:    05/12/24 1211  Mobility  Activity Pivoted/transferred to/from [facility]  Level of Assistance Minimal assist, patient does 75% or more  Assistive Device Other (Comment) (HHA)  Distance Ambulated (ft) 5 ft  Range of Motion/Exercises Active  Activity Response Tolerated poorly  Mobility Referral Yes  Mobility visit 1 Mobility  Mobility Specialist Start Time (ACUTE ONLY) 1211  Mobility Specialist Stop Time (ACUTE ONLY) 1238  Mobility Specialist Time Calculation (min) (ACUTE ONLY) 27 min   Received pt in room requesting assistance to transfer to [facility]. Pt c/o feeling tired and weak. Pt needing minA to transfer to EOB, CGA to stand, and towards the end of session, MinA to transfer back to bed. Pt able to lift legs into bed w/o assist. Returned pt to bed w/ all needs met.   Venetia Keel Mobility Specialist Please Neurosurgeon or Rehab Office at [phone]

## 2024-05-13 DIAGNOSIS — I5033 Acute on chronic diastolic (congestive) heart failure: Secondary | ICD-10-CM | POA: Diagnosis not present

## 2024-05-13 LAB — BASIC METABOLIC PANEL WITH GFR
Anion gap: 13 (ref 5–15)
BUN: 58 mg/dL — ABNORMAL HIGH (ref 8–23)
CO2: 30 mmol/L (ref 22–32)
Calcium: 9.2 mg/dL (ref 8.9–10.3)
Chloride: 93 mmol/L — ABNORMAL LOW (ref 98–111)
Creatinine, Ser: 1.6 mg/dL — ABNORMAL HIGH (ref 0.44–1.00)
GFR, Estimated: 31 mL/min — ABNORMAL LOW
Glucose, Bld: 119 mg/dL — ABNORMAL HIGH (ref 70–99)
Potassium: 4.1 mmol/L (ref 3.5–5.1)
Sodium: 135 mmol/L (ref 135–145)

## 2024-05-13 LAB — CBC
HCT: 30.4 % — ABNORMAL LOW (ref 36.0–46.0)
Hemoglobin: 9.4 g/dL — ABNORMAL LOW (ref 12.0–15.0)
MCH: 28 pg (ref 26.0–34.0)
MCHC: 30.9 g/dL (ref 30.0–36.0)
MCV: 90.5 fL (ref 80.0–100.0)
Platelets: 187 K/uL (ref 150–400)
RBC: 3.36 MIL/uL — ABNORMAL LOW (ref 3.87–5.11)
RDW: 20 % — ABNORMAL HIGH (ref 11.5–15.5)
WBC: 6.6 K/uL (ref 4.0–10.5)
nRBC: 0.3 % — ABNORMAL HIGH (ref 0.0–0.2)

## 2024-05-13 MED ORDER — LINEZOLID 600 MG PO TABS
600.0000 mg | ORAL_TABLET | Freq: Two times a day (BID) | ORAL | Status: DC
  Administered 2024-05-13 – 2024-05-14 (×2): 600 mg via ORAL
  Filled 2024-05-13 (×4): qty 1

## 2024-05-13 MED ORDER — TORSEMIDE 20 MG PO TABS
100.0000 mg | ORAL_TABLET | Freq: Every day | ORAL | Status: DC
  Administered 2024-05-14: 100 mg via ORAL
  Filled 2024-05-13: qty 1

## 2024-05-13 NOTE — Progress Notes (Signed)
 Physical Therapy Treatment Patient Details Name: [name] MRN: [id number] DOB: 10-22-1933 Today's Date: 05/13/2024   History of Present Illness The pt is an 89 yo female presenting 1/3 with infection of LLE wound (admitted 11/28-12/8 after a fall with LE wounds). Work up also revealed SOB and AMS. Admitted for management of CHF exacerbation and cellulitis. 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    PT Comments  Pt tolerated session well, completing sit to stands with AD and minimal physical assistance and participated in LE strengthening while seated in recliner. Pt declining further ambulation due to fatigue. Pt would benefit from further transfer, and gait training. PT will continue to treat pt while she is admitted. Patient will benefit from continued inpatient follow up therapy, <3 hours/day.    If plan is discharge home, recommend the following: A little help with walking and/or transfers;A little help with bathing/dressing/bathroom;Assistance with cooking/housework;Assist for transportation;Help with stairs or ramp for entrance   Can travel by private vehicle     Yes  Equipment Recommendations  None recommended by PT    Recommendations for Other Services       Precautions / Restrictions Precautions Precautions: Fall Recall of Precautions/Restrictions: Intact Precaution/Restrictions Comments: 2L, LE wounds Restrictions Weight Bearing Restrictions Per Provider Order: No     Mobility  Bed Mobility Overal bed mobility: Needs Assistance             General bed mobility  comments: pt received and returned to recliner    Transfers Overall transfer level: Needs assistance Equipment used: Rolling walker (2 wheels) Transfers: Sit to/from Stand, Bed to chair/wheelchair/BSC Sit to Stand: Min assist   Step pivot transfers: Min assist       General transfer comment: Pt completed STS from EOB and BSC with RW and minimal physical assistance for standing. Pt then completed stand-step transfer from recliner to [facility] on R with RW and minmial physical assistance for walker management. VC given for sequencing; increased time to complete.    Ambulation/Gait                   Stairs             Wheelchair Mobility     Tilt Bed    Modified Rankin (Stroke Patients Only)       Balance Overall balance assessment: Needs assistance Sitting-balance support: No upper extremity supported, Feet supported Sitting balance-Leahy Scale: Good Sitting balance - Comments: seated edge of recliner for LE strengthening; no LOB Postural control: Posterior lean Standing balance support: Bilateral upper extremity supported, During functional activity, Reliant on assistive device for balance Standing balance-Leahy Scale: Poor Standing balance comment: reliant on external support for improved stability                            Communication Communication Communication: No apparent difficulties  Cognition Arousal: Alert Behavior During Therapy: WFL for tasks assessed/performed   PT - Cognitive impairments: No family/caregiver present to determine baseline, Memory, Awareness, Attention, Problem solving, Safety/Judgement  PT - Cognition Comments: Therapist provided frequent redirection throughout session as pt often tangential and reporting not wanting to perform various mobility tasks while other people were in the room; only therapist and pt were in room. Following commands: Intact      Cueing Cueing Techniques:  Verbal cues, Gestural cues  Exercises General Exercises - Lower Extremity Long Arc Quad: AROM, Both, 10 reps Hip Flexion/Marching: AROM, Both, 10 reps    General Comments General comments (skin integrity, edema, etc.): VSS on 2L      Pertinent Vitals/Pain Pain Assessment Pain Assessment: No/denies pain Pain Intervention(s): Monitored during session    Home Living                          Prior Function            PT Goals (current goals can now be found in the care plan section) Acute Rehab PT Goals Patient Stated Goal: to improve mobility and reduce pain PT Goal Formulation: With patient Time For Goal Achievement: 05/25/24 Potential to Achieve Goals: Fair Progress towards PT goals: Progressing toward goals    Frequency    Min 2X/week      PT Plan      Co-evaluation              AM-PAC PT 6 Clicks Mobility   Outcome Measure  Help needed turning from your back to your side while in a flat bed without using bedrails?: A Little Help needed moving from lying on your back to sitting on the side of a flat bed without using bedrails?: A Little Help needed moving to and from a bed to a chair (including a wheelchair)?: A Little Help needed standing up from a chair using your arms (e.g., wheelchair or bedside chair)?: A Little Help needed to walk in hospital room?: Total Help needed climbing 3-5 steps with a railing? : Total 6 Click Score: 14    End of Session Equipment Utilized During Treatment: Oxygen  Activity Tolerance: Patient limited by fatigue Patient left: in chair;with call bell/phone within reach;with chair alarm set Nurse Communication: Mobility status PT Visit Diagnosis: Unsteadiness on feet (R26.81);Muscle weakness (generalized) (M62.81);Pain Pain - Right/Left: Left Pain - part of body: Leg     Time: 1225-1248 PT Time Calculation (min) (ACUTE ONLY): 23 min  Charges:    $Therapeutic Activity: 23-37 mins PT General Charges $$  ACUTE PT VISIT: 1 Visit                     Leontine Hilt DPT Acute Rehab Services [phone] Prefer contact via chat    Leontine NOVAK Cathern Tahir 05/13/2024, 1:08 PM

## 2024-05-13 NOTE — TOC Progression Note (Addendum)
 Transition of Care [facility]) - Progression Note    Patient Details  Name: [name] MRN: [id number] Date of Birth: Sep 21, 1933  [facility]) CM/SW Contact  Luise JAYSON Pan, [place] Phone Number: 05/13/2024, 8:42 AM  Clinical Narrative:   CSW received message from Darrien, AD at [facility] and rehab, that her admin stated patient does not have to pay the copays upfront. CSW notified [name] and she would like CSW to try for insurance auth at this time. CSW followed up with [facility] and inquired with MD about patients medical readiness.  8:55 AM Per MD, patient will potentially be medically stable within the next 48 hrs. CSW started auth with [employer] at this time for [facility] and rehab and [facility].  CSW will continue to follow.    Expected Discharge Plan: Skilled Nursing Facility Barriers to Discharge: Continued Medical Work up, SNF Pending bed offer, English As A Second Language Teacher               Expected Discharge Plan and Services In-house Referral: Clinical Social Work   Post Acute Care Choice: Skilled Nursing Facility Living arrangements for the past 2 months: Single Family Home                                       Social Drivers of Health (SDOH) Interventions SDOH Screenings   Food Insecurity: No Food Insecurity (05/10/2024)  Housing: Low Risk (05/10/2024)  Transportation Needs: No Transportation Needs (05/10/2024)  Utilities: Not At Risk (05/10/2024)  Alcohol  Screen: Low Risk (11/06/2023)  Depression (PHQ2-9): Low Risk (04/21/2024)  Financial Resource Strain: Low Risk (11/06/2023)  Physical Activity: Insufficiently Active (11/06/2023)  Social Connections: Socially Isolated (05/10/2024)  Stress: No Stress Concern Present (11/06/2023)  Tobacco Use: Medium Risk (05/08/2024)  Health Literacy: Adequate Health Literacy (11/06/2023)    Readmission Risk Interventions    05/10/2024    4:15 PM 06/17/2023   10:07 AM 01/31/2023    3:53 PM  Readmission Risk Prevention Plan   Transportation Screening Complete Complete Complete  HRI or Home Care Consult   Complete  Palliative Care Screening   Not Applicable  Medication Review (RN Care Manager) Referral to Pharmacy  Complete  PCP or Specialist appointment within 3-5 days of discharge Complete    HRI or Home Care Consult Complete Complete   SW Recovery Care/Counseling Consult Complete Complete   Palliative Care Screening Not Applicable Not Applicable   Skilled Nursing Facility Not Applicable Not Applicable

## 2024-05-13 NOTE — Progress Notes (Signed)
 " PROGRESS NOTE  [name]  FMW:[id number] DOB: 1934/03/21 DOA: 05/08/2024 PCP: [name], MD   Brief Narrative: Patient is a 89 year old female with history of HFpEF, tricuspid regurgitation, moderate mitral/aortic stenosis, pulmonary hypertension, RV failure, ICD placement, permanent A-fib failed antiarrhythmics in the past had multiple cardioversions, oral anticoagulant due to history of recurrent GI bleed, CKD stage IIIb, COPD, chronic hypoxic respiratory failure on 2 to 3 L of oxygen  per minute, hypertension, hyperlipidemia, 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  Cardiology consulted after admission.  Has elevated BNP.  Chest x-ray showed small bilateral pleural effusion.  X-ray of left tibia/femur negative for osteomyelitis.  CTA did not show any acute findings.  Cardiology are following, on IV diuresis.  Palliative care consulted for goals of care.  Wound care consulted.  Plan is for SNF   Assessment & Plan:  Principal Problem:   Acute on chronic heart failure with preserved ejection fraction (HFpEF) (HCC) Active Problems:   Primary pancreatic neuroendocrine tumor (HCC)   Atrial fibrillation, permanent (HCC)   Leg wound, left   Acute encephalopathy   Acute on chronic HFpEF/severe pulmonary hypertension/right ventricular failure: Followed by advanced heart failure team as an outpatient, not a candidate for pulmonary vasodilators.  She was recently here a month ago for decompensated heart failure, cardiology recommending palliative care/hospice approach.  Palliative care meeting was completed and she refused hospice.  She was discharged on torsemide , metolazone .  Last echo had shown EF of 55 - 60%, grade 3 diastolic dysfunction, moderately reduced right ventricular function, severe elevated pulmonary artery systolic pressure.  Patient appeared volume overloaded on presentation,  elevated proBNP.   Cardiology were consulted.  Started on Lasix  drip.  Plan for transitioning to torsemide , as needed metolazone  tomorrow.  Chronic hypoxic respiratory failure: On 2 to 3 L of oxygen  per minute at baseline.  Currently on the same  Acute encephalopathy : Very confused, oriented to self only on presentation.  Now almost back to baseline.  Urine analysis not suspicious for UTI.  CT head did not show any acute findings.  Blood gas without evidence of hypercarbia.Ammonia, vitamin B12, TSH normal  Large left lower extremity cellulitis/wound: History of large left lower extremity laceration after the fall.  Sutures was placed on 12/28.  She has chronic wounds on her distal leg and dorsum of the foot.  No fever or leukocytosis.  X-ray of the left tibia/shoulder negative for osteomyelitis.  On linezolid , Zosyn .  Wound care consulted.  She was seen by plastic surgery for this on last admission, recommended to continue wound care and finish the antibiotics.  She also follows with outpatient wound care.  Patient does not want any intervention for the left lower extremity wound.  Antibiotics will be changed to oral soon.  No evidence of ongoing worsening of infection.  Permanent A-fib: Failed antiarrhythmics in the past.  Not on chronic anticoagulation due to history of recurrent GI bleed.  Rate controlled  CKD stage IIIb: Currently kidney function at baseline  History of COPD: Currently stable, not in exacerbation  Coronary artery disease: No anginal symptoms at present  Hypertension:BP  stable.  Hyperlipidemia:On  Crestor   Anemia of chronic disease: Hemoglobin dropped to the range of 6.  Given unit of PRBC.  Currently hemoglobin stable in the range of 9 today.  Given a dose of arenesp  Primary pancreatic neuroendocrine tumor: Follows with oncology, general surgery.  She is not a candidate for surgery due to her age and comorbidities.  She was started on Sandostatin  injection every 28 days  on 03/27/22  Goals of care: Elderly patient with multiple comorbidities.  Very poor prognosis.  CODE STATUS DNR.  Palliative  care consulted again.  Patient does not feel ready for hospice yet.  I had a long discussion with patient's daughter [name] about her poor prognosis.  She  understands        DVT prophylaxis:heparin  injection 5,000 Units Start: 05/09/24 0600     Code Status: Limited: Do not attempt resuscitation (DNR) -DNR-LIMITED -Do Not Intubate/DNI   Family Communication: Called and discussed with daughter [name] on phone on 1/7  Patient status:Inpatient  Patient is from :home  Anticipated discharge to: SNF  Estimated DC date: Medically stable for discharge tomorrow  Consultants: Cardiology,palliative care  Procedures:None  Antimicrobials:  Anti-infectives (From admission, onward)    Start     Dose/Rate Route Frequency Ordered Stop   05/09/24 1000  linezolid  (ZYVOX ) IVPB 600 mg        600 mg 300 mL/hr over 60 Minutes Intravenous Every 12 hours 05/09/24 0547     05/09/24 0600  piperacillin -tazobactam (ZOSYN ) IVPB 3.375 g        3.375 g 12.5 mL/hr over 240 Minutes Intravenous Every 8 hours 05/09/24 0553     05/09/24 0030  piperacillin -tazobactam (ZOSYN ) IVPB 3.375 g        3.375 g 100 mL/hr over 30 Minutes Intravenous  Once 05/09/24 0026 05/09/24 0108   05/09/24 0030  linezolid  (ZYVOX ) IVPB 600 mg        600 mg 300 mL/hr over 60 Minutes Intravenous  Once 05/09/24 0026 05/09/24 0500       Subjective: Patient seen and examined at bedside today.  Hemodynamically stable.  Lying in bed.  Remains weak and deconditioned.  Feels better than yesterday.  Still has dyspnea.  No significant left lower extremity pain today.   Objective: Vitals:   05/13/24 0347 05/13/24 0500 05/13/24 0858 05/13/24 1117  BP: (!) 132/55  (!) 134/40 (!) 119/57  Pulse:   (!) 56 61  Resp: 19  20 18   Temp: (!) 97.4 F (36.3 C)  (!) 97.1 F (36.2 C) (!) 97.1 F (36.2 C)  TempSrc: Oral   Axillary Axillary  SpO2: 100%  99% 99%  Weight:  56.2 kg    Height:        Intake/Output Summary (Last 24 hours) at 05/13/2024 1143 Last data filed at 05/13/2024 0900 Gross per 24 hour  Intake 759.8 ml  Output 600 ml  Net 159.8 ml   Filed Weights   05/11/24 0500 05/12/24 0500 05/13/24 0500  Weight: 55.7 kg 56.1 kg 56.2 kg    Examination:  General exam: Chronically ill looking, weak and deconditioned HEENT: PERRL Respiratory system: Crackles on bilateral bases Cardiovascular system: Irregular irregular rhythm Gastrointestinal system: Abdomen is nondistended, soft and nontender. Central nervous system: Alert and oriented Extremities: Trace bilateral lower extremity pitting Skin: Large laceration lesion with sutures on left leg chronic wounds on the distal left leg and dorsum of the left foot( see picture)     Data Reviewed: I have personally reviewed following labs and imaging studies  CBC: Recent Labs  Lab 05/09/24 0025 05/09/24 0136 05/10/24 0333 05/11/24 0254 05/12/24 0255 05/13/24 0335  WBC 7.2  --  6.0 6.7 4.0 6.6  NEUTROABS 6.0  --   --   --   --   --  HGB 7.8* 9.5*  9.5* 7.5* 7.3* 6.9* 9.4*  HCT 26.0* 28.0*  28.0* 25.3* 24.3* 22.7* 30.4*  MCV 92.2  --  92.7 92.4 91.9 90.5  PLT 222  --  208 207 193 187   Basic Metabolic Panel: Recent Labs  Lab 05/09/24 0025 05/09/24 0136 05/10/24 0006 05/10/24 0333 05/11/24 0254 05/12/24 0255 05/12/24 1500 05/13/24 0335  NA 140 140  139 140  --  135 135  --  135  K 3.6 3.5  3.5 3.9  --  3.4* 3.2* 4.5 4.1  CL 103 104 102  --  97* 94*  --  93*  CO2 23  --  21*  --  24 32  --  30  GLUCOSE 106* 98 103*  --  101* 118*  --  119*  BUN 69* 70* 73*  --  67* 61*  --  58*  CREATININE 1.39* 1.50* 1.49*  --  1.59* 1.53*  --  1.60*  CALCIUM  9.3  --  8.7*  --  9.0 9.0  --  9.2  MG  --   --   --  1.7  --  2.3  --   --   PHOS  --   --   --  3.4  --   --   --   --      Recent Results (from the past 240 hours)  Blood  Culture (routine x 2)     Status: None (Preliminary result)   Collection Time: 05/09/24 12:25 AM   Specimen: BLOOD  Result Value Ref Range Status   Specimen Description BLOOD RIGHT ANTECUBITAL  Final   Special Requests   Final    BOTTLES DRAWN AEROBIC AND ANAEROBIC Blood Culture adequate volume   Culture   Final    NO GROWTH 4 DAYS Performed at [facility], [address], [place], [place] [address]    Report Status PENDING  Incomplete  Blood Culture (routine x 2)     Status: None (Preliminary result)   Collection Time: 05/09/24  8:55 AM   Specimen: BLOOD  Result Value Ref Range Status   Specimen Description BLOOD RIGHT ANTECUBITAL  Final   Special Requests   Final    BOTTLES DRAWN AEROBIC ONLY Blood Culture results may not be optimal due to an inadequate volume of blood received in culture bottles   Culture   Final    NO GROWTH 4 DAYS Performed at [facility], [address], [place], [place] [address]    Report Status PENDING  Incomplete     Radiology Studies: No results found.   Scheduled Meds:  sodium chloride    Intravenous Once   darbepoetin (ARANESP ) injection - DIALYSIS  25 mcg Subcutaneous Q Wed-1800   famotidine   20 mg Oral Daily   fluticasone   1 spray Each Nare Daily   heparin   5,000 Units Subcutaneous Q8H   metolazone   2.5 mg Oral Once per day on Tuesday Thursday Saturday   rosuvastatin   10 mg Oral Daily   Continuous Infusions:  furosemide  100 mg (05/13/24 0522)   linezolid  (ZYVOX ) IV 600 mg (05/13/24 1113)   piperacillin -tazobactam (ZOSYN )  IV 3.375 g (05/13/24 0524)     LOS: 4 days   Ivonne Mustache, MD [facility] P1/12/2024, 11:43 AM  "

## 2024-05-13 NOTE — Progress Notes (Signed)
 Discussed with primary team, will transition back to her home schedule of torsemide  and metolazone .  Recommend continued involvement of palliative care. Cardiology will sign off.

## 2024-05-13 NOTE — Plan of Care (Signed)
  Problem: Education: Goal: Knowledge of General Education information will improve Description: Including pain rating scale, medication(s)/side effects and non-pharmacologic comfort measures Outcome: Progressing   Problem: Clinical Measurements: Goal: Ability to maintain clinical measurements within normal limits will improve Outcome: Progressing Goal: Will remain free from infection Outcome: Progressing   Problem: Activity: Goal: Risk for activity intolerance will decrease Outcome: Progressing   Problem: Coping: Goal: Level of anxiety will decrease Outcome: Progressing   

## 2024-05-13 NOTE — Progress Notes (Signed)
 Mobility Specialist: Progress Note   05/13/24 1459  Mobility  Activity Pivoted/transferred to/from [facility];Ambulated with assistance;Pivoted/transferred from chair to bed  Level of Assistance Minimal assist, patient does 75% or more  Assistive Device Front wheel walker  Distance Ambulated (ft) 5 ft  Activity Response Tolerated well  Mobility Referral Yes  Mobility visit 1 Mobility  Mobility Specialist Start Time (ACUTE ONLY) 1425  Mobility Specialist Stop Time (ACUTE ONLY) 1450  Mobility Specialist Time Calculation (min) (ACUTE ONLY) 25 min    Pt received in chair. MinA for boost to stand, completed step pivot to [facility]. Very loose BM successful, totA for pericare in standing. Afterwards, pt ambulated 5' to the bed. Underwear and pad donned at EOB. SV for sit>supine. No complaints. Left in bed with all needs met, call bell in reach. Bed alarm on.   Ileana Lute Mobility Specialist Please contact via SecureChat or Rehab office at [phone]

## 2024-05-13 NOTE — Plan of Care (Signed)
   Problem: Education: Goal: Knowledge of General Education information will improve Description: Including pain rating scale, medication(s)/side effects and non-pharmacologic comfort measures Outcome: Progressing   Problem: Activity: Goal: Risk for activity intolerance will decrease Outcome: Progressing   Problem: Nutrition: Goal: Adequate nutrition will be maintained Outcome: Progressing

## 2024-05-13 NOTE — Progress Notes (Signed)
 Mobility Specialist: Progress Note   05/13/24 1400  Mobility  Activity Pivoted/transferred to/from [facility];Pivoted/transferred from bed to chair  Level of Assistance Minimal assist, patient does 75% or more  Assistive Device Front wheel walker  Activity Response Tolerated well  Mobility Referral Yes  Mobility visit 1 Mobility  Mobility Specialist Start Time (ACUTE ONLY) 0945  Mobility Specialist Stop Time (ACUTE ONLY) 1007  Mobility Specialist Time Calculation (min) (ACUTE ONLY) 22 min    Pt received in bed, pleasant confused and agreeable to mobility session. CGA for bed mobility, little assist with the bed pad but pt doing majority of the work. MinA for boost to stand. Once she gets her balance, minG for step pivot transfer to [facility] and chair. No complaints. Left in chair with all needs met, call bell in reach. Chair alarm on.   Ileana Lute Mobility Specialist Please contact via SecureChat or Rehab office at [phone]

## 2024-05-13 NOTE — Care Management Important Message (Signed)
 Important Message  Patient Details  Name: [name] MRN: [id number] Date of Birth: 14-May-1933   Important Message Given:  Yes - Medicare IM     [name] 05/13/2024, 10:26 AM

## 2024-05-14 DIAGNOSIS — N179 Acute kidney failure, unspecified: Secondary | ICD-10-CM

## 2024-05-14 DIAGNOSIS — G9341 Metabolic encephalopathy: Secondary | ICD-10-CM

## 2024-05-14 DIAGNOSIS — I5033 Acute on chronic diastolic (congestive) heart failure: Secondary | ICD-10-CM | POA: Diagnosis not present

## 2024-05-14 DIAGNOSIS — D649 Anemia, unspecified: Secondary | ICD-10-CM

## 2024-05-14 DIAGNOSIS — I5084 End stage heart failure: Secondary | ICD-10-CM

## 2024-05-14 LAB — CULTURE, BLOOD (ROUTINE X 2)
Culture: NO GROWTH
Culture: NO GROWTH
Special Requests: ADEQUATE

## 2024-05-14 LAB — CBC
HCT: 28.2 % — ABNORMAL LOW (ref 36.0–46.0)
Hemoglobin: 8.5 g/dL — ABNORMAL LOW (ref 12.0–15.0)
MCH: 27.7 pg (ref 26.0–34.0)
MCHC: 30.1 g/dL (ref 30.0–36.0)
MCV: 91.9 fL (ref 80.0–100.0)
Platelets: 179 K/uL (ref 150–400)
RBC: 3.07 MIL/uL — ABNORMAL LOW (ref 3.87–5.11)
RDW: 20.3 % — ABNORMAL HIGH (ref 11.5–15.5)
WBC: 4.5 K/uL (ref 4.0–10.5)
nRBC: 0 % (ref 0.0–0.2)

## 2024-05-14 LAB — BASIC METABOLIC PANEL WITH GFR
Anion gap: 10 (ref 5–15)
BUN: 65 mg/dL — ABNORMAL HIGH (ref 8–23)
CO2: 34 mmol/L — ABNORMAL HIGH (ref 22–32)
Calcium: 9.1 mg/dL (ref 8.9–10.3)
Chloride: 93 mmol/L — ABNORMAL LOW (ref 98–111)
Creatinine, Ser: 1.73 mg/dL — ABNORMAL HIGH (ref 0.44–1.00)
GFR, Estimated: 28 mL/min — ABNORMAL LOW
Glucose, Bld: 119 mg/dL — ABNORMAL HIGH (ref 70–99)
Potassium: 3.6 mmol/L (ref 3.5–5.1)
Sodium: 137 mmol/L (ref 135–145)

## 2024-05-14 MED ORDER — LORAZEPAM 2 MG/ML PO CONC: 1.0000 mg | ORAL | Status: DC | PRN

## 2024-05-14 MED ORDER — OXYCODONE HCL 20 MG/ML PO CONC: 5.0000 mg | ORAL | Status: DC | PRN

## 2024-05-14 MED ORDER — GLYCOPYRROLATE 1 MG PO TABS: 1.0000 mg | ORAL_TABLET | ORAL | Status: DC | PRN

## 2024-05-14 MED ORDER — HALOPERIDOL LACTATE 5 MG/ML IJ SOLN: 0.5000 mg | INTRAMUSCULAR | Status: DC | PRN

## 2024-05-14 MED ORDER — POLYVINYL ALCOHOL 1.4 % OP SOLN: 1.0000 [drp] | Freq: Four times a day (QID) | OPHTHALMIC | Status: DC | PRN

## 2024-05-14 MED ORDER — GLYCOPYRROLATE 0.2 MG/ML IJ SOLN: 0.2000 mg | INTRAMUSCULAR | Status: DC | PRN

## 2024-05-14 MED ORDER — HALOPERIDOL 0.5 MG PO TABS: 0.5000 mg | ORAL_TABLET | ORAL | Status: DC | PRN

## 2024-05-14 MED ORDER — LORAZEPAM 2 MG/ML IJ SOLN
1.0000 mg | INTRAMUSCULAR | Status: DC | PRN
  Administered 2024-05-14 – 2024-05-15 (×3): 1 mg via INTRAVENOUS
  Filled 2024-05-14 (×3): qty 1

## 2024-05-14 MED ORDER — HYDROMORPHONE HCL 1 MG/ML IJ SOLN
0.5000 mg | INTRAMUSCULAR | Status: DC | PRN
  Administered 2024-05-14: 1 mg via INTRAVENOUS
  Filled 2024-05-14: qty 1

## 2024-05-14 MED ORDER — HALOPERIDOL LACTATE 2 MG/ML PO CONC: 0.5000 mg | ORAL | Status: DC | PRN

## 2024-05-14 MED ORDER — LORAZEPAM 1 MG PO TABS: 1.0000 mg | ORAL_TABLET | ORAL | Status: DC | PRN

## 2024-05-14 NOTE — Progress Notes (Signed)
 Occupational Therapy Treatment Patient Details Name: [name] MRN: [id number] DOB: 1933/07/10 Today's Date: 05/14/2024   History of present illness The pt is an 89 yo female presenting 1/3 with infection of LLE wound (admitted 11/28-12/8 after a fall with LE wounds). Work up also revealed SOB and AMS. Admitted for management of CHF exacerbation and cellulitis. 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   OT comments  Pt assisted with toileting after bowel incontinence. Required moderate assistance to stand with pt demonstrating posterior bias, min assist to ambulate with RW to bathroom, total assist for pericare and LB bathing and dressing. Pt completed hand washing in sitting with set up. Returned to supine with supervision. Pt reporting pain in buttocks, unable to get comfortable in chair or bed, RN notified. Pt with significant decline in cognition since initial OT evaluation. Demonstrated decreased safety awareness, poor attention and decreased problem solving. Pt seeing people who were not in room. Patient will benefit from continued inpatient follow up therapy, <3 hours/day.      If plan is discharge home, recommend the following:  A little help with walking and/or transfers;A lot of help with bathing/dressing/bathroom;Assistance with cooking/housework;Direct supervision/assist for medications management;Direct supervision/assist for financial management;Assist for transportation;Help with stairs or ramp for entrance   Equipment Recommendations  Other (comment) (defer)    Recommendations  for Other Services      Precautions / Restrictions Precautions Precautions: Fall Recall of Precautions/Restrictions: Intact Precaution/Restrictions Comments: 2L at baseline, LE wounds, sore bottom Restrictions Weight Bearing Restrictions Per Provider Order: No       Mobility Bed Mobility Overal bed mobility: Needs Assistance Bed Mobility: Sit to Supine       Sit to supine: Supervision        Transfers Overall transfer level: Needs assistance Equipment used: Rolling walker (2 wheels) Transfers: Sit to/from Stand Sit to Stand: Mod assist           General transfer comment: assist to rise and steady, posterior lean     Balance Overall balance assessment: Needs assistance Sitting-balance support: No upper extremity supported, Feet supported Sitting balance-Leahy Scale: Good     Standing balance support: Bilateral upper extremity supported, During functional activity, Reliant on assistive device for balance Standing balance-Leahy Scale: Poor Standing balance comment: reliant on  RW and min assist                           ADL either performed or assessed with clinical judgement   ADL Overall ADL's : Needs assistance/impaired     Grooming: Wash/dry hands;Sitting;Set up       Lower Body Bathing: Maximal assistance;Sit to/from stand;Sitting/lateral leans   Upper Body Dressing : Maximal assistance;Sitting   Lower Body Dressing: Total assistance;Sit to/from stand   Toilet Transfer: Minimal assistance;Ambulation;Rolling walker (2 wheels)   Toileting- Clothing Manipulation and Hygiene: Total assistance;Sit to/from stand       Functional mobility during ADLs: Minimal assistance;Rolling walker (2 wheels)      Extremity/Trunk Assessment              Vision  Perception     Praxis     Communication Communication Communication: No apparent difficulties   Cognition Arousal: Alert Behavior During Therapy: Impulsive Cognition:  Cognition impaired   Orientation impairments: Situation, Time Awareness: Intellectual awareness impaired, Online awareness impaired Memory impairment (select all impairments): Short-term memory, Working memory, Non-declarative long-term memory, Geneticist, Molecular long-term memory Attention impairment (select first level of impairment): Focused attention Executive functioning impairment (select all impairments): Organization, Sequencing, Reasoning, Problem solving                   Following commands: Impaired Following commands impaired: Follows one step commands inconsistently      Cueing   Cueing Techniques: Verbal cues, Gestural cues  Exercises      Shoulder Instructions       General Comments      Pertinent Vitals/ Pain       Pain Assessment Pain Assessment: Faces Faces Pain Scale: Hurts even more Pain Location: buttocks Pain Descriptors / Indicators: Grimacing, Guarding, Discomfort Pain Intervention(s): Repositioned, Patient requesting pain meds-RN notified  Home Living                                          Prior Functioning/Environment              Frequency  Min 2X/week        Progress Toward Goals  OT Goals(current goals can now be found in the care plan section)  Progress towards OT goals: Progressing toward goals  Acute Rehab OT Goals OT Goal Formulation: With patient Time For Goal Achievement: 05/26/24 Potential to Achieve Goals: Fair  Plan      Co-evaluation                 AM-PAC OT 6 Clicks Daily Activity     Outcome Measure   Help from another person eating meals?: A Little Help from another person taking care of personal grooming?: A Little Help from another person toileting, which includes using toliet, bedpan, or urinal?: Total Help from another person bathing (including washing, rinsing, drying)?: A Lot Help from another person to put on and taking off regular upper body clothing?: A Lot Help from  another person to put on and taking off regular lower body clothing?: Total 6 Click Score: 12    End of Session Equipment Utilized During Treatment: Rolling walker (2 wheels);Gait belt;Oxygen  (2L)  OT Visit Diagnosis: Unsteadiness on feet (R26.81);Other abnormalities of gait and mobility (R26.89);Muscle weakness (generalized) (M62.81);Other symptoms and signs involving cognitive function;Pain   Activity Tolerance Patient tolerated treatment well   Patient Left in bed;with call bell/phone within reach;with bed alarm set;with family/visitor present;with nursing/sitter in room   Nurse Communication Patient requests pain meds        Time: 1122-1205 OT Time Calculation (min): 43 min  Charges: OT General Charges $OT Visit: 1 Visit OT Treatments $Self Care/Home Management : 38-52 mins  Mliss HERO, OTR/L Acute Rehabilitation Services Office: [phone]   Kennth Mliss Helling 05/14/2024, 12:20 PM

## 2024-05-14 NOTE — Progress Notes (Signed)
 "                                                                                                                                                                                                          Daily Progress Note   Patient Name: [name]       Date: 05/14/2024 DOB: January 10, 1934  Age: 89 y.o. MRN#: [id number] Attending Physician: Jillian Buttery, MD Primary Care Physician: [name], MD Admit Date: 05/08/2024  Reason for Follow-up: Establishing goals of care  Patient Profile/HPI:  89 y.o. female  with past medical history of chronic HFpEF, chronic RV failure with pulmonary hypertension, CKD stage IV, permanent A-fib off anticoagulation due to GI bleed, pancreatic neuroendocrine tumor, NICM s/p [employer] ICD, and history of renal cell carcinoma s/p right nephrectomy.  She presented to the ED on 05/08/2024 with confusion, shortness of breath, and concern for infection of left lower extremity wound.  Chest x-ray showed volume overload and proBNP 9242.   Palliative Medicine has been consulted for goals of care discussions and complex medical decision making.   Subjective: Chart reviewed- labs today indicate worsening kidney function with Cr trending up- 1.73 today (previously 1.60, 1.53, 1.59). CBC shows Hgb trending down- 8.5 (9.4 yesterday).  Discussed case with Dr. Jillian, Social worker and Medical Illustrator. Patient has end stage heart failure and has reached maximum benefit of hospitalization, however, daughter cannot take care of her at home.  On my evaluation patient was agitated, removing her oxygen , trying to get out of bed. Very confused. [name] states that her current mental status is not her baseline.  Discussed plan of care with [name]- noted trending Cr up, hgb decreasing, patient continues on antibiotics for wound infection, she is not eating or drinking. [name] doesn't want further diagnostics for altered mental status. [name] shared that she is in a place where she is  accepting her mother's status is in decline towards end of life. She inquired about hospice. Her goal would be for her mother not to return to the hospital.  Discussed transition to comfort measures only which includes stopping IV fluids, antibiotics, labs and providing symptom management for SOB, anxiety, nausea, vomiting, and other symptoms of dying. [name] is in agreement and wishes to transition to comfort measures. I noted this would mean stopping antibiotics and stop checking lab results.  We discussed hospice services and philosophy of care. I shared that I am uncertain if patient would qualify for inpatient hospice- but with transition to comfort measures, she may decline to point of requiring aggressive  symptom management.    Review of Systems  Unable to perform ROS: Mental status change     Physical Exam Constitutional:      Appearance: She is ill-appearing.     Comments: Frail, cachetic  Cardiovascular:     Rate and Rhythm: Tachycardia present.  Neurological:     Mental Status: She is alert. She is disoriented.             Vital Signs: BP (!) 142/58 (BP Location: Left Arm)   Pulse 67   Temp (!) 97.5 F (36.4 C) (Axillary)   Resp 20   Ht 5' 6 (1.676 m)   Wt 49.2 kg   SpO2 98%   BMI 17.51 kg/m  SpO2: SpO2: 98 % O2 Device: O2 Device: (S) Room Air O2 Flow Rate: O2 Flow Rate (L/min): 2 L/min  Intake/output summary:  Intake/Output Summary (Last 24 hours) at 05/14/2024 1559 Last data filed at 05/14/2024 1349 Gross per 24 hour  Intake 240 ml  Output 600 ml  Net -360 ml   LBM: Last BM Date : 05/13/24 Baseline Weight: Weight: 52.6 kg Most recent weight: Weight: 49.2 kg       Palliative Assessment/Data: PPS: 20%      Patient Active Problem List   Diagnosis Date Noted   Leg wound, left 05/09/2024   Acute encephalopathy 05/09/2024   Aortic valve stenosis 04/04/2024   Nonrheumatic mitral valve stenosis 04/04/2024   Acute on chronic right-sided heart failure (HCC)  04/04/2024   Acute on chronic heart failure with preserved ejection fraction (HFpEF) (HCC) 04/02/2024   Protein-calorie malnutrition, severe 01/14/2024   MRSA (methicillin resistant staph aureus) culture positive 01/12/2024   History of bacteremia 09/16/2023   Pulmonary hypertension due to COPD (HCC) 09/06/2023   Sepsis due to cellulitis (HCC) 09/03/2023   Presence of heart assist device (HCC) 08/30/2023   Medication management 08/29/2023   Cellulitis 08/26/2023   FTT (failure to thrive) in adult 06/24/2023   Venous stasis ulcer of left lower leg with edema of left lower leg (HCC) 06/18/2023   (HFpEF) heart failure with preserved ejection fraction (HCC)    Renal cell carcinoma s/p nephrectomy 2003(HCC) 11/25/2022   Herpes zoster 09/02/2022   Decreased hearing of right ear 07/25/2022   DNR (do not resuscitate) 07/25/2022   Bilateral impacted cerumen 05/20/2022   Chronic hypoxic respiratory failure, on home oxygen  therapy (HCC) 04/03/2022   Primary pancreatic neuroendocrine tumor (HCC) 03/20/2022   Osteoporosis 10/11/2021   Squamous cell carcinoma of foot, 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   Chronic gout due to renal impairment 01/30/2021   History of blood transfusion reaction 04/17/2020   Angiodysplasia of small intestine    Gastric polyps    GI bleed 03/28/2020   Palliative care by specialist    Advanced directives, counseling/discussion    Normocytic anemia 01/06/2020   History of gastritis 11/11/2019   Chronic venous hypertension w ulceration (HCC)    Black stools    Chronic bilateral pleural effusions 11/04/2018   Iliac artery stenosis, bilateral 07/14/2017   Chronic blood loss anemia 11/18/2016   Severe pulmonary hypertension (HCC) 12/04/2015   B12 deficiency 05/22/2015   PAD (peripheral artery disease)    History of nephrectomy secondary to RCC    TIA (transient ischemic attack) 08/16/2014   Other fatigue 01/14/2014   Chronic diastolic CHF (congestive heart failure) (HCC) 06/04/2013    Valvular heart disease 06/04/2013   Allergic rhinitis 01/01/2013   Chronic cholecystitis with calculus 09/15/2012   Claudication 01/21/2012  Hypertension 05/20/2011   HOCM/IHSS 07/14/2009   Hyperlipidemia 04/10/2009   INCONTINENCE, URGE 04/10/2009   NICM (nonischemic cardiomyopathy) (HCC) 10/27/2008   Atrial fibrillation, permanent (HCC) 10/27/2008   SINUS BRADYCARDIA 10/27/2008   Carotid artery stenosis 10/27/2008   COPD (chronic obstructive pulmonary disease) (HCC) 10/27/2008   CKD stage 3b, GFR 30-44 ml/min (HCC) 10/27/2008   ICD (implantable cardioverter-defibrillator) in place 10/27/2008    Palliative Care Assessment & Plan    Assessment/Recommendations/Plan  End stage heart failure with multiple hospitalizations for volume overload, now with delirium/metabolic encephalopathy, acute kidney injury with Cr trending up, chronic anemia with Hgb trending down- plan for full comfort measures only Orders placed for oxycodone  concentrated solution 5mg  q2hr prn for pain or dyspnea Lorazepam  1mg  po or IV for agitation, anxiety Hydromorphone  IV 0.5mg  IV q2hr prn for severe shortness of breath or other discomfort Haldol  0.5mg  IV or po q4 hours for agitation TOC order placed for referral to hospice Discussed above with Dr. Jillian, social work and care manager   Code Status:   Code Status: Do not attempt resuscitation (DNR) - Comfort care   Prognosis:  < 2 weeks  Discharge Planning: To Be Determined   Thank you for allowing the Palliative Medicine Team to assist in the care of this patient.  I personally spent a total of 65 minutes in the care of the patient today including preparing to see the patient, getting/reviewing separately obtained history, performing a medically appropriate exam/evaluation, counseling and educating, placing orders, referring and communicating with other health care professionals, documenting clinical information in the EHR, communicating results, and  coordinating care.   [name], AGNP-C Palliative Medicine   Please contact Palliative Medicine Team phone at [phone] for questions and concerns.        "

## 2024-05-14 NOTE — TOC Progression Note (Addendum)
 Transition of Care [facility]) - Progression Note    Patient Details  Name: [name] MRN: [id number] Date of Birth: May 25, 1933  Transition [facility]) CM/SW Contact  Luise JAYSON Pan, [place] Phone Number: 05/14/2024, 10:37 AM  Clinical Narrative:  9:25 AM CSW notified by HTA that insurance is offering a peer to peer due by 10 am tomorrow. Will need to call Dr Elspeth Sar at [phone]. CSW relayed information to MD. Peer to peer is for SNF. Insurance informed CSW that insurance approved PTAR request, id [id number].  Per MD, insurance denied SNF authorization. MD informed CSW that Dr. Sar with [facility] would like to speak with CSW. CSW spoke with Dr. Sar about patients case. Per Dr. Sar, patient has a pattern of going to SNF and unsafely discharging home without the means to take care of herself; patient was also utilizing hospice/palliative services throughout the past year, per [facility]TA MD. Dr. Sar inquired if an APS report should be made. CSW informed Dr. Sar that patients family informed CSW that they are working on a plan and schedule to have someone with patient at home when they are unavailable. CSW further explained that an APS report may not be accepted as the family is appearing to be actively involved with patient and CSW can only make a report from a first party account. CSW also had a chance to speak with [facility] prior to call with Dr. Sar. [name] stated that she is trying to apply for [facility] Medicaid for patient. [name] also inquired about becoming patients POA. CSW informed [name] that the hospital Chaplains can only assist with HCPOA. CSW explained that that [name] would have to go though the court system/lawyer for durable POA. [name] expressed her understanding.   CSW informed MD, bedside RN, and RNCM via secure chat of above information. Per MD, patient is medically stable for discharge. CSW notified TOC leadership, Erminio Herring.   3:08 PM RNCM informed that family may consider private  pay at [facility]. CSW inquired with Emmalene how much it costs for family to privately pay. Per Darrien, out of pocket cost is approximately $10,590/month. CSW informed [name].  3:31 PM [facility] reached out to treatment team to inform that family reached out to [facility] for potential hospice services.   3:59 PM CSW initiated referral to [facility].   CSW will continue to follow.    Expected Discharge Plan: Skilled Nursing Facility Barriers to Discharge: Continued Medical Work up, SNF Pending bed offer, English As A Second Language Teacher               Expected Discharge Plan and Services In-house Referral: Clinical Social Work   Post Acute Care Choice: Skilled Nursing Facility Living arrangements for the past 2 months: Single Family Home                                       Social Drivers of Health (SDOH) Interventions SDOH Screenings   Food Insecurity: No Food Insecurity (05/10/2024)  Housing: Low Risk (05/10/2024)  Transportation Needs: No Transportation Needs (05/10/2024)  Utilities: Not At Risk (05/10/2024)  Alcohol  Screen: Low Risk (11/06/2023)  Depression (PHQ2-9): Low Risk (04/21/2024)  Financial Resource Strain: Low Risk (11/06/2023)  Physical Activity: Insufficiently Active (11/06/2023)  Social Connections: Socially Isolated (05/10/2024)  Stress: No Stress Concern Present (11/06/2023)  Tobacco Use: Medium Risk (05/08/2024)  Health Literacy: Adequate Health Literacy (11/06/2023)    Readmission Risk Interventions  05/10/2024    4:15 PM 06/17/2023   10:07 AM 01/31/2023    3:53 PM  Readmission Risk Prevention Plan  Transportation Screening Complete Complete Complete  HRI or Home Care Consult   Complete  Palliative Care Screening   Not Applicable  Medication Review (RN Care Manager) Referral to Pharmacy  Complete  PCP or Specialist appointment within 3-5 days of discharge Complete    HRI or Home Care Consult Complete Complete   SW Recovery Care/Counseling Consult Complete Complete   Palliative Care  Screening Not Applicable Not Applicable   Skilled Nursing Facility Not Applicable Not Applicable

## 2024-05-14 NOTE — TOC Progression Note (Addendum)
 Transition of Care (TOC) - Progression Note  Rayfield Gobble RN, BSN Inpatient Care Management Unit 4E- RN Case Manager See Treatment Team for direct phone # [phone] cross coverage  Patient Details  Name: [name] MRN: [id number] Date of Birth: 06/11/1933  Transition of Care [facility] CM/SW Contact  Gobble Rayfield Hurst, RN Phone Number: 05/14/2024, 3:17 PM  Clinical Narrative:    Noted pt's insurance has denied STSNF stay. Pt's daughter requesting to speak with ICM regarding next steps and plan for discharge.   RNCM stopped by room to speak with daughter at bedside. Pt resting and did participate in conversation, at times reaching for objects not seen and crying out that she was falling-however was lying in bed. At this time pt only oriented to self.   Per daughter she understands that insurance has issued a denial for SNF and also aware that manufacturing engineer may file report with APS. Daughter states she feels it may be time to think about Hospice however also has concerns as in current state pt not safe to be at home alone, and daughter/family can not provide 24/7 care. Daughter voiced that she plans to apply for Medicaid- however CM explained that this process can take up to 30-90 days especially if applying for LTC.  Daughter voiced she is also considering paying out of pocket for pt to stay at [facility] where a family member works.- she is going to follow up regarding the cost for out of pocket stay at SNF.   Confirmed with daughter that pt had [facility] HH prior to admit, CM has reached out to [facility] liaison as well to confirm they can resume HH if pt returns home- awaiting return call from liaison.   Daughter would like to complete HCPOA- however pt not oriented to complete paperwork at this time.   Daughter requesting to speak with PC- liaison to stop by this afternoon to speak with daughter.   CM listened with empathy and answered questions that daughter had at this  time.  ICM will continue to follow for safe transition plan.   Update 1630- PC has met with daughter [name]- has made decision for Hospice- ICM team had received msg from [facility] liaison that daughter had called them requesting them for Hospice- [facility] team to follow up for Hospice needs.    Expected Discharge Plan: Skilled Nursing Facility Barriers to Discharge: Continued Medical Work up, SNF Pending bed offer, English As A Second Language Teacher               Expected Discharge Plan and Services In-house Referral: Clinical Social Work   Post Acute Care Choice: Skilled Nursing Facility Living arrangements for the past 2 months: Single Family Home                                       Social Drivers of Health (SDOH) Interventions SDOH Screenings   Food Insecurity: No Food Insecurity (05/10/2024)  Housing: Low Risk (05/10/2024)  Transportation Needs: No Transportation Needs (05/10/2024)  Utilities: Not At Risk (05/10/2024)  Alcohol  Screen: Low Risk (11/06/2023)  Depression (PHQ2-9): Low Risk (04/21/2024)  Financial Resource Strain: Low Risk (11/06/2023)  Physical Activity: Insufficiently Active (11/06/2023)  Social Connections: Socially Isolated (05/10/2024)  Stress: No Stress Concern Present (11/06/2023)  Tobacco Use: Medium Risk (05/08/2024)  Health Literacy: Adequate Health Literacy (11/06/2023)    Readmission Risk Interventions    05/10/2024    4:15  PM 06/17/2023   10:07 AM 01/31/2023    3:53 PM  Readmission Risk Prevention Plan  Transportation Screening Complete Complete Complete  HRI or Home Care Consult   Complete  Palliative Care Screening   Not Applicable  Medication Review (RN Care Manager) Referral to Pharmacy  Complete  PCP or Specialist appointment within 3-5 days of discharge Complete    HRI or Home Care Consult Complete Complete   SW Recovery Care/Counseling Consult Complete Complete   Palliative Care Screening Not Applicable Not Applicable   Skilled Nursing Facility  Not Applicable Not Applicable

## 2024-05-14 NOTE — Progress Notes (Signed)
 " PROGRESS NOTE  [name]  FMW:[id number] DOB: August 06, 1933 DOA: 05/08/2024 PCP: [name], MD   Brief Narrative: Patient is a 89 year old female with history of HFpEF, tricuspid regurgitation, moderate mitral/aortic stenosis, pulmonary hypertension, RV failure, ICD placement, permanent A-fib failed antiarrhythmics in the past had multiple cardioversions, oral anticoagulant due to history of recurrent GI bleed, CKD stage IIIb, COPD, chronic hypoxic respiratory failure on 2 to 3 L of oxygen  per minute, hypertension, hyperlipidemia, 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  Cardiology consulted after admission.  Has elevated BNP.  Chest x-ray showed small bilateral pleural effusion.  X-ray of left tibia/femur negative for osteomyelitis.  CTA did not show any acute findings.  Cardiology are following, on IV diuresis.  Palliative care consulted for goals of care.  Wound care consulted.  Her insurance declined SNF.  TOC closely following: Consideration for long-term care placement.  Medically stable for discharge whenever possible  Assessment & Plan:  Principal Problem:   Acute on chronic heart failure with preserved ejection fraction (HFpEF) (HCC) Active Problems:   Primary pancreatic neuroendocrine tumor (HCC)   Atrial fibrillation, permanent (HCC)   Leg wound, left   Acute encephalopathy   Acute on chronic HFpEF/severe pulmonary hypertension/right ventricular failure: Followed by advanced heart failure team as an outpatient, not a candidate for pulmonary vasodilators.  She was recently here a month ago for decompensated heart failure, cardiology recommending palliative care/hospice approach.  Palliative care meeting was completed and she refused hospice.  She was discharged on torsemide , metolazone .  Last echo had shown EF of 55 - 60%, grade 3 diastolic dysfunction, moderately reduced right  ventricular function, severe elevated pulmonary artery systolic pressure.  Patient appeared volume overloaded on presentation, elevated proBNP.   Cardiology were consulted.  Started on Lasix  drip.  Transitioned to torsemide , as needed metolazone  tomorrow.  Chronic hypoxic respiratory failure: On 2 to 3 L of oxygen  per minute at baseline.  Currently on the same  Acute encephalopathy : Very confused, oriented to self only on presentation.  Now almost back to baseline.  Urine analysis not suspicious for UTI.  CT head did not show any acute findings.  Blood gas without evidence of hypercarbia.Ammonia, vitamin B12, TSH normal  Large left lower extremity cellulitis/wound: History of large left lower extremity laceration after the fall.  Sutures was placed on 12/28.  She has chronic wounds on her distal leg and dorsum of the foot.  No fever or leukocytosis.  X-ray of the left tibia/shoulder negative for osteomyelitis.  Started on linezolid , Zosyn .  Wound care consulted.  She was seen by plastic surgery for this on last admission, recommended to continue wound care and finish the antibiotics.  She also follows with outpatient wound care.  Patient does not want any intervention for the left lower extremity wound.    No evidence of ongoing worsening of infection.  Wound culture from August 2025 showed Corynebacterium, MRSA.  Continue linezolid  to finish the course  Permanent A-fib: Failed antiarrhythmics in the past.  Not on chronic anticoagulation due to history of recurrent GI bleed.  Rate controlled  CKD stage IIIb: Currently kidney function at baseline  History of COPD: Currently stable, not in exacerbation  Coronary artery disease: No anginal symptoms at present  Hypertension:BP  stable.  Hyperlipidemia:On  Crestor   Anemia of chronic disease: Hemoglobin dropped to the range of 6.  Given unit of PRBC.  Currently hemoglobin  stable in the range of  8 today.  Also given a dose of arenesp during this  hospitalization  Primary pancreatic neuroendocrine tumor: Follows with oncology, general surgery.  She is not a candidate for surgery due to her age and comorbidities.  She was started on Sandostatin  injection every 28 days on 03/27/22  Goals of care: Elderly patient with multiple comorbidities.  Very poor prognosis.  CODE STATUS DNR.  Palliative  care consulted again.  Patient does not feel ready for hospice yet.  I had a long discussion with patient's daughter [name] about her poor prognosis.         DVT prophylaxis:heparin  injection 5,000 Units Start: 05/09/24 0600     Code Status: Limited: Do not attempt resuscitation (DNR) -DNR-LIMITED -Do Not Intubate/DNI   Family Communication: Called and discussed with daughter [name] on phone on 1/7  Patient status:Inpatient  Patient is from :home  Anticipated discharge to: Long-term care versus home  Estimated DC date: Medically stable for discharge whenever possible  Consultants: Cardiology,palliative care  Procedures:None  Antimicrobials:  Anti-infectives (From admission, onward)    Start     Dose/Rate Route Frequency Ordered Stop   05/13/24 2200  linezolid  (ZYVOX ) tablet 600 mg        600 mg Oral Every 12 hours 05/13/24 1148 05/22/24 2159   05/09/24 1000  linezolid  (ZYVOX ) IVPB 600 mg  Status:  Discontinued        600 mg 300 mL/hr over 60 Minutes Intravenous Every 12 hours 05/09/24 0547 05/13/24 1148   05/09/24 0600  piperacillin -tazobactam (ZOSYN ) IVPB 3.375 g  Status:  Discontinued        3.375 g 12.5 mL/hr over 240 Minutes Intravenous Every 8 hours 05/09/24 0553 05/13/24 1148   05/09/24 0030  piperacillin -tazobactam (ZOSYN ) IVPB 3.375 g        3.375 g 100 mL/hr over 30 Minutes Intravenous  Once 05/09/24 0026 05/09/24 0108   05/09/24 0030  linezolid  (ZYVOX ) IVPB 600 mg        600 mg 300 mL/hr over 60 Minutes Intravenous  Once 05/09/24 0026 05/09/24 0500       Subjective: Patient seen and examined at bedside today.   Lying on bed.  Same like yesterday.  No active issues.  Does not complain of any worsening shortness of breath or cough.  Lower extremity edema look better.  Objective: Vitals:   05/14/24 0009 05/14/24 0312 05/14/24 0455 05/14/24 0729  BP: (!) 137/47 (!) 140/91  (!) 119/97  Pulse: 62 62  64  Resp: 16 18  15   Temp: 97.6 F (36.4 C) 97.6 F (36.4 C)  97.9 F (36.6 C)  TempSrc: Oral Oral  Oral  SpO2: 100% 98%  100%  Weight:   49.2 kg   Height:        Intake/Output Summary (Last 24 hours) at 05/14/2024 1052 Last data filed at 05/14/2024 0436 Gross per 24 hour  Intake 2240 ml  Output 300 ml  Net 1940 ml   Filed Weights   05/12/24 0500 05/13/24 0500 05/14/24 0455  Weight: 56.1 kg 56.2 kg 49.2 kg    Examination:  General exam: Overall comfortable, not in distress, weak and deconditioned HEENT: PERRL Respiratory system: Mild crackles in bilateral bases Cardiovascular system: Irregularly irregular rhythm Gastrointestinal system: Abdomen is nondistended, soft and nontender. Central nervous system: Alert and oriented Extremities: Trace bilateral lower extremity pitting edema, no clubbing ,no cyanosis Skin: Large laceration lesion with sutures on left leg chronic wounds on the distal  left leg and dorsum of the left foot( see picture)     Data Reviewed: I have personally reviewed following labs and imaging studies  CBC: Recent Labs  Lab 05/09/24 0025 05/09/24 0136 05/10/24 0333 05/11/24 0254 05/12/24 0255 05/13/24 0335 05/14/24 0323  WBC 7.2  --  6.0 6.7 4.0 6.6 4.5  NEUTROABS 6.0  --   --   --   --   --   --   HGB 7.8*   < > 7.5* 7.3* 6.9* 9.4* 8.5*  HCT 26.0*   < > 25.3* 24.3* 22.7* 30.4* 28.2*  MCV 92.2  --  92.7 92.4 91.9 90.5 91.9  PLT 222  --  208 207 193 187 179   < > = values in this interval not displayed.   Basic Metabolic Panel: Recent Labs  Lab 05/10/24 0006 05/10/24 0333 05/11/24 0254 05/12/24 0255 05/12/24 1500 05/13/24 0335 05/14/24 0323  NA 140   --  135 135  --  135 137  K 3.9  --  3.4* 3.2* 4.5 4.1 3.6  CL 102  --  97* 94*  --  93* 93*  CO2 21*  --  24 32  --  30 34*  GLUCOSE 103*  --  101* 118*  --  119* 119*  BUN 73*  --  67* 61*  --  58* 65*  CREATININE 1.49*  --  1.59* 1.53*  --  1.60* 1.73*  CALCIUM  8.7*  --  9.0 9.0  --  9.2 9.1  MG  --  1.7  --  2.3  --   --   --   PHOS  --  3.4  --   --   --   --   --      Recent Results (from the past 240 hours)  Blood Culture (routine x 2)     Status: None   Collection Time: 05/09/24 12:25 AM   Specimen: BLOOD  Result Value Ref Range Status   Specimen Description BLOOD RIGHT ANTECUBITAL  Final   Special Requests   Final    BOTTLES DRAWN AEROBIC AND ANAEROBIC Blood Culture adequate volume   Culture   Final    NO GROWTH 5 DAYS Performed at [facility], [address] [place], [place] [address]    Report Status 05/14/2024 FINAL  Final  Blood Culture (routine x 2)     Status: None   Collection Time: 05/09/24  8:55 AM   Specimen: BLOOD  Result Value Ref Range Status   Specimen Description BLOOD RIGHT ANTECUBITAL  Final   Special Requests   Final    BOTTLES DRAWN AEROBIC ONLY Blood Culture results may not be optimal due to an inadequate volume of blood received in culture bottles   Culture   Final    NO GROWTH 5 DAYS Performed at [facility], [address], [place], [place] [address]    Report Status 05/14/2024 FINAL  Final     Radiology Studies: No results found.   Scheduled Meds:  darbepoetin (ARANESP ) injection - DIALYSIS  25 mcg Subcutaneous Q Wed-1800   famotidine   20 mg Oral Daily   fluticasone   1 spray Each Nare Daily   heparin   5,000 Units Subcutaneous Q8H   linezolid   600 mg Oral Q12H   metolazone   2.5 mg Oral Once per day on Tuesday Thursday Saturday   rosuvastatin   10 mg Oral Daily   torsemide   100 mg Oral Daily   Continuous  Infusions:     LOS: 5 days   Ivonne Mustache, MD [facility] P1/01/2025, 10:52 AM  "

## 2024-05-14 NOTE — Plan of Care (Signed)
 Transitioned to comfort measures only.  Medicated for pain with PRN, see MAR for details.  Family updated on plan of care.   Problem: Education: Goal: Knowledge of General Education information will improve Description: Including pain rating scale, medication(s)/side effects and non-pharmacologic comfort measures Outcome: Progressing   Problem: Health Behavior/Discharge Planning: Goal: Ability to manage health-related needs will improve Outcome: Progressing   Problem: Clinical Measurements: Goal: Ability to maintain clinical measurements within normal limits will improve Outcome: Progressing

## 2024-05-14 NOTE — Progress Notes (Signed)
 Mobility Specialist Progress Note;   05/14/24 1006  Mobility  Activity Pivoted/transferred from bed to chair  Level of Assistance Minimal assist, patient does 75% or more  Assistive Device Front wheel walker  Distance Ambulated (ft) 5 ft  Activity Response Tolerated fair  Mobility Referral Yes  Mobility visit 1 Mobility  Mobility Specialist Start Time (ACUTE ONLY) 1006  Mobility Specialist Stop Time (ACUTE ONLY) 1020  Mobility Specialist Time Calculation (min) (ACUTE ONLY) 14 min   Pt agreeable to mobility. Required MinG for bed mobility, MinA to safely transfer from bed to chair. Pleasantly confused throughout, needing VC for redirection and safety. VSS on 2LO2. Pt left in chair with all needs met, alarm on.  Lauraine Erm Mobility Specialist Please contact via SecureChat or [employer] [phone]

## 2024-05-14 NOTE — Plan of Care (Signed)
   Problem: Safety: Goal: Ability to remain free from injury will improve Outcome: Progressing

## 2024-05-14 NOTE — Progress Notes (Signed)
 Notified by Dr. Jillian that patient is a potential discharge.  LCSWA Janee notified me that patient and family requesting assistance with updating/changing HCPOA.  Chaplain consult order placed at this time.

## 2024-05-14 NOTE — Progress Notes (Addendum)
 This chaplain responded to the RN-Ivy's consult for creating the Pt. Advance Directive as the Pt. prepares for discharge. The chaplain reviewed the Pt. chart notes before the visit. The Pt. is finishing her lunch at the time of the visit. The Pt. daughter-[name] is visiting at the Pt. bedside.  The chaplain completed AD education with the Pt. and [name]. At the time of the visit the Pt. is unable to answer the chaplain's clarifying questions indicating the role of an HCPOA. At this time, the chaplain is unable to complete documentation of HCPOA.  The chaplain listened reflectively as [name] described her history in the Pt.'s care. The chaplain provided education on [place] Hierarchy of Decision Makers and [name]'s shared role of surrogate decision maker with the Pt. other daughter-Susan.   This chaplain offered a revisit to F/U on HCPOA on Monday. [name] declined and asked the chaplain to leave.  Chaplain Leeroy Hummer [phone]

## 2024-05-14 NOTE — Progress Notes (Signed)
 [id number] [facility] Liaison Note  Received referral directly from family for interest in Hospice Home Inpatient Unit.  Eligibility pending.  Spoke with granddaughter, Bari, to acknowledge the referral and explain services.  Liaison will assess patient tomorrow for inpatient unit.  Please call with any questions or concerns.  Thank you Inocente Jacobs RN BSN [facility] Liaison [phone]

## 2024-05-15 DIAGNOSIS — N189 Chronic kidney disease, unspecified: Secondary | ICD-10-CM

## 2024-05-15 DIAGNOSIS — I5033 Acute on chronic diastolic (congestive) heart failure: Secondary | ICD-10-CM | POA: Diagnosis not present

## 2024-05-15 MED ORDER — LORAZEPAM 2 MG/ML IJ SOLN
1.0000 mg | INTRAMUSCULAR | Status: DC | PRN
  Administered 2024-05-15: 1 mg via INTRAVENOUS
  Filled 2024-05-15: qty 1

## 2024-05-15 MED ORDER — TRAMADOL HCL 50 MG PO TABS: 50.0000 mg | ORAL_TABLET | Freq: Four times a day (QID) | ORAL | Status: DC | PRN

## 2024-05-15 MED ORDER — LORAZEPAM 2 MG/ML PO CONC: 1.0000 mg | ORAL | Status: DC | PRN

## 2024-05-15 MED ORDER — CHLORHEXIDINE GLUCONATE CLOTH 2 % EX PADS
6.0000 | MEDICATED_PAD | Freq: Every day | CUTANEOUS | Status: DC
  Administered 2024-05-15: 6 via TOPICAL

## 2024-05-15 MED ORDER — LORAZEPAM 1 MG PO TABS: 1.0000 mg | ORAL_TABLET | ORAL | Status: DC | PRN

## 2024-05-15 NOTE — Progress Notes (Signed)
 "                                                                                                                                                                                                          Daily Progress Note   Patient Name: [name]       Date: 05/15/2024 DOB: [birth year]  Age: 89 y.o. MRN#: [id number] Attending Physician: Jillian Buttery, MD Primary Care Physician: [name], MD Admit Date: 05/08/2024  Reason for Follow-up: Terminal Care  Patient Profile/HPI:   89 y.o. female  with past medical history of chronic HFpEF, chronic RV failure with pulmonary hypertension, CKD stage IV, permanent A-fib off anticoagulation due to GI bleed, pancreatic neuroendocrine tumor, NICM s/p [employer] ICD, and history of renal cell carcinoma s/p right nephrectomy.  She presented to the ED on 05/08/2024 with confusion, shortness of breath, and concern for infection of left lower extremity wound.  Chest x-ray showed volume overload and proBNP 9242.   Palliative Medicine has been consulted for goals of care discussions and complex medical decision making. She was transitioned to full comfort measures only on 1/10.  Discussion: Received 1mg  hydromorphone  yesterday afternoon, and 3 doses of 1mg  IV lorazepam  in the last 24 hours.  Discussed with Dr. Jillian and bedside RN Hadassah- patient's daughter had questions related to comfort measures and medications.  Met with [name] outside the room. Reviewed goals of care- [name] agrees goals of care are comfort focused as patient approaches end of life.  [name] was concerned about patient receiving hydromorphone . She noted it had made her very sick with vomiting in the past.  Answered her questions about inpatient hospice home. [name] concerned if hospice home will be able to let her know when death is imminent. I assured her that typically patient's experience objective changes as they approach their last minutes and that hospice nurses will notify her  if she isn't there.  Patient's niece at bedside discussed that patient required narcan  in the past with hydromorphone  and takes tramadol  instead.  I shared concern that as patient progresses towards end of life she will have symptom need with shortness of breath that are best treated with opioids. Recommended using nausea medication at same time as opioid for symptom relief. Discussed that it is expected she will get sleepier as her illness progresses- and narcan  won't be used. Family agreed to opioid use if patient showed severe signs of pain or shortness of breath.   Review of Systems  Unable to perform ROS: Mental status change     Physical  Exam Vitals and nursing note reviewed.  Constitutional:      Comments: Frail, cachectic  Cardiovascular:     Rate and Rhythm: Normal rate.  Pulmonary:     Effort: Pulmonary effort is normal.  Neurological:     Comments: Restless, moan at times             Vital Signs: BP (!) 167/148 (BP Location: Right Arm)   Pulse (!) 52   Temp (!) 97.5 F (36.4 C) (Oral)   Resp 14   Ht 5' 6 (1.676 m)   Wt 49.2 kg   SpO2 (!) 63%   BMI 17.51 kg/m  SpO2: SpO2: (!) 63 % O2 Device: O2 Device: Nasal Cannula O2 Flow Rate: O2 Flow Rate (L/min): 3 L/min  Intake/output summary:  Intake/Output Summary (Last 24 hours) at 05/15/2024 1200 Last data filed at 05/15/2024 0900 Gross per 24 hour  Intake 0 ml  Output 625 ml  Net -625 ml   LBM: Last BM Date : 05/13/24 Baseline Weight: Weight: 52.6 kg Most recent weight: Weight:  (Pt now on Comfort Care)       Palliative Assessment/Data: PPS: 20%      Patient Active Problem List   Diagnosis Date Noted   Leg wound, left 05/09/2024   Acute encephalopathy 05/09/2024   Aortic valve stenosis 04/04/2024   Nonrheumatic mitral valve stenosis 04/04/2024   Acute on chronic right-sided heart failure (HCC) 04/04/2024   Acute on chronic heart failure with preserved ejection fraction (HFpEF) (HCC) 04/02/2024    Protein-calorie malnutrition, severe 01/14/2024   MRSA (methicillin resistant staph aureus) culture positive 01/12/2024   History of bacteremia 09/16/2023   Pulmonary hypertension due to COPD (HCC) 09/06/2023   Sepsis due to cellulitis (HCC) 09/03/2023   Presence of heart assist device (HCC) 08/30/2023   Medication management 08/29/2023   Cellulitis 08/26/2023   FTT (failure to thrive) in adult 06/24/2023   Venous stasis ulcer of left lower leg with edema of left lower leg (HCC) 06/18/2023   (HFpEF) heart failure with preserved ejection fraction (HCC)    Renal cell carcinoma s/p nephrectomy 2003(HCC) 11/25/2022   Herpes zoster 09/02/2022   Decreased hearing of right ear 07/25/2022   DNR (do not resuscitate) 07/25/2022   Bilateral impacted cerumen 05/20/2022   Chronic hypoxic respiratory failure, on home oxygen  therapy (HCC) 04/03/2022   Primary pancreatic neuroendocrine tumor (HCC) 03/20/2022   Osteoporosis 10/11/2021   Squamous cell carcinoma of foot, 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   Chronic gout due to renal impairment 01/30/2021   History of blood transfusion reaction 04/17/2020   Angiodysplasia of small intestine    Gastric polyps    GI bleed 03/28/2020   Palliative care by specialist    Advanced directives, counseling/discussion    Normocytic anemia 01/06/2020   History of gastritis 11/11/2019   Chronic venous hypertension w ulceration (HCC)    Black stools    Chronic bilateral pleural effusions 11/04/2018   Iliac artery stenosis, bilateral 07/14/2017   Chronic blood loss anemia 11/18/2016   Severe pulmonary hypertension (HCC) 12/04/2015   B12 deficiency 05/22/2015   PAD (peripheral artery disease)    History of nephrectomy secondary to RCC    TIA (transient ischemic attack) 08/16/2014   Other fatigue 01/14/2014   Chronic diastolic CHF (congestive heart failure) (HCC) 06/04/2013   Valvular heart disease 06/04/2013   Allergic rhinitis 01/01/2013   Chronic cholecystitis with calculus  09/15/2012   Claudication 01/21/2012   Hypertension 05/20/2011   HOCM/IHSS 07/14/2009   Hyperlipidemia  04/10/2009   INCONTINENCE, URGE 04/10/2009   NICM (nonischemic cardiomyopathy) (HCC) 10/27/2008   Atrial fibrillation, permanent (HCC) 10/27/2008   SINUS BRADYCARDIA 10/27/2008   Carotid artery stenosis 10/27/2008   COPD (chronic obstructive pulmonary disease) (HCC) 10/27/2008   CKD stage 3b, GFR 30-44 ml/min (HCC) 10/27/2008   ICD (implantable cardioverter-defibrillator) in place 10/27/2008    Palliative Care Assessment & Plan    Assessment/Recommendations/Plan  End stage heart failure, acute kidney injury on chronic kidney disease- plan is for comfort measures only D/C hydromorphone , continue IV lorazepam - IV lorazepam  increased to q2hr prn IV If patient requires opioids recommend administering with ondansetron  to prevent nausea- discussed with hospice liaison Nat   Code Status:   Code Status: Do not attempt resuscitation (DNR) - Comfort care   Prognosis:  < 2 weeks  Discharge Planning: Hospice facility  Thank you for allowing the Palliative Medicine Team to assist in the care of this patient.  Cassondra Stain, AGNP-C Palliative Medicine   Please contact Palliative Medicine Team phone at [phone] for questions and concerns.        "

## 2024-05-15 NOTE — TOC Transition Note (Signed)
 Transition of Care [facility]) - Discharge Note   Patient Details  Name: [name] MRN: [id number] Date of Birth: 09-12-1933  Transition of Care [facility]) CM/SW Contact:  Dayveon Halley A Troy Hartzog, LCSW Phone Number: 05/15/2024, 3:38 PM   Clinical Narrative:     Patient will DC to: [facility]  Anticipated DC date: 05/15/24  Family notified: [name] Lamer  Transport by: [place]      Per MD patient ready for DC to [facility] . RN, patient, patient's family, and facility notified of DC. Discharge Summary and FL2 sent to facility. RN to call report prior to discharge [phone]). DC packet on chart. Ambulance transport requested for patient.     CSW will sign off for now as social work intervention is no longer needed. Please consult us  again if new needs arise.   Final next level of care: Hospice Medical Facility Barriers to Discharge: Barriers Resolved   Patient Goals and CMS Choice Patient states their goals for this hospitalization and ongoing recovery are:: End of Life Care CMS Medicare.gov Compare Post Acute Care list provided to:: Other (Comment Required) (pt's daughter) Choice offered to / list presented to : Adult Children [place] ownership interest in [facility].provided to:: Adult Children    Discharge Placement              Patient chooses bed at:  [facility] [place], [place]) Patient to be transferred to facility by: [facility] Name of family member notified: Vietta Bonifield, daughter Patient and family notified of of transfer: 05/15/24  Discharge Plan and Services Additional resources added to the After Visit Summary for   In-house Referral: Clinical Social Work   Post Acute Care Choice: Skilled Nursing Facility                               Social Drivers of Health (SDOH) Interventions SDOH Screenings   Food Insecurity: No Food Insecurity (05/10/2024)  Housing: Low Risk (05/10/2024)   Transportation Needs: No Transportation Needs (05/10/2024)  Utilities: Not At Risk (05/10/2024)  Alcohol  Screen: Low Risk (11/06/2023)  Depression (PHQ2-9): Low Risk (04/21/2024)  Financial Resource Strain: Low Risk (11/06/2023)  Physical Activity: Insufficiently Active (11/06/2023)  Social Connections: Socially Isolated (05/10/2024)  Stress: No Stress Concern Present (11/06/2023)  Tobacco Use: Medium Risk (05/08/2024)  Health Literacy: Adequate Health Literacy (11/06/2023)     Readmission Risk Interventions    05/10/2024    4:15 PM 06/17/2023   10:07 AM 01/31/2023    3:53 PM  Readmission Risk Prevention Plan  Transportation Screening Complete Complete Complete  HRI or Home Care Consult   Complete  Palliative Care Screening   Not Applicable  Medication Review (RN Care Manager) Referral to Pharmacy  Complete  PCP or Specialist appointment within 3-5 days of discharge Complete    HRI or Home Care Consult Complete Complete   SW Recovery Care/Counseling Consult Complete Complete   Palliative Care Screening Not Applicable Not Applicable   Skilled Nursing Facility Not Applicable Not Applicable

## 2024-05-15 NOTE — Progress Notes (Signed)
 Jolynn Pack [phone] [facility] Liaison Note  Referral received from Kristi, [place] for family interest in Hospice Home.   Met with patient and family in room to explain services and hospice philosophy and all questions answered.   Hospice Home is able to accept patient this afternoon once consents are complete.   RN staff, you may call report at any time to [facility] @ [phone], room is assigned when report is called.   Please leave IV intact and send completed DNR with patient.   Updated attending and ICM via Epic chat.  Thank you for the opportunity to participate in this patient's care   Nat Babe, BSN, RN Hospice Nurse Liaison [phone]

## 2024-05-15 NOTE — Discharge Summary (Signed)
 Physician Discharge Summary  ORETTA BERKLAND FMW:[id number] DOB: 07/09/1933 DOA: 05/08/2024  PCP: [name], MD  Admit date: 05/08/2024 Discharge date: 05/15/2024  Admitted From: Home Disposition:  Residential hospice  Discharge Condition:Stable CODE STATUS:Comfort Care Diet recommendation:  Regular    Brief/Interim Summary: Patient is a 89 year old female with history of HFpEF, tricuspid regurgitation, moderate mitral/aortic stenosis, pulmonary hypertension, RV failure, ICD placement, permanent A-fib failed antiarrhythmics in the past had multiple cardioversions, oral anticoagulant due to history of recurrent GI bleed, CKD stage IIIb, COPD, chronic hypoxic respiratory failure on 2 to 3 L of oxygen  per minute, hypertension, hyperlipidemia, 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  Cardiology consulted after admission.  Has elevated BNP.  Chest x-ray showed small bilateral pleural effusion.  X-ray of left tibia/femur negative for osteomyelitis.  CTA did not show any acute findings.  Cardiology are following, on IV diuresis.  Palliative care consulted for goals of care.  Wound care consulted.  Her insurance declined SNF.  After extensive discussion about goals of care with family, palliative care again involved, her care transitioned to comfort.  Now the plan is for residential hospice.  Following problems were addressed during the hospitalization:   Acute on chronic HFpEF/severe pulmonary hypertension/right ventricular failure: Followed by advanced heart failure team as an outpatient, not a candidate for pulmonary vasodilators.  She was recently here a month ago for decompensated heart failure, cardiology recommending palliative care/hospice approach.  Palliative care meeting was completed and she refused hospice.  She was discharged on torsemide , metolazone .  Last echo had shown EF of 55 - 60%,  grade 3 diastolic dysfunction, moderately reduced right ventricular function, severe elevated pulmonary artery systolic pressure.  Patient appeared volume overloaded on presentation, elevated proBNP.   Cardiology were consulted.  Started on Lasix  drip.  Transitioned to torsemide .  Now on comfort care   Chronic hypoxic respiratory failure: On 2 to 3 L of oxygen  per minute at baseline.  Currently on the same   Acute encephalopathy : Very confused, oriented to self only on presentation.  Now on comfort care   Large left lower extremity cellulitis/wound: History of large left lower extremity laceration after the fall.  Sutures was placed on 12/28.  She has chronic wounds on her distal leg and dorsum of the foot.  No fever or leukocytosis.  X-ray of the left tibia/shoulder negative for osteomyelitis.  Started on linezolid , Zosyn .  Wound care consulted.  She was seen by plastic surgery for this on last admission, recommended to continue wound care and finish the antibiotics.  She also follows with outpatient wound care.  Patient does not want any intervention for the left lower extremity wound.    No evidence of ongoing worsening of infection.  Wound culture from August 2025 showed Corynebacterium, MRSA.  Now on  comfort care  Other chronic medical problems:   Permanent A-fib  CKD stage IIIb  History of COPD  Coronary artery disease  Hypertension  Hyperlipidemia  Anemia of chronic disease  Primary pancreatic neuroendocrine tumor      Discharge Diagnoses:  Principal Problem:   Acute on chronic heart failure with preserved ejection fraction (HFpEF) (HCC) Active Problems:   Primary pancreatic neuroendocrine tumor (HCC)   Atrial fibrillation, permanent (HCC)   Leg wound, left   Acute encephalopathy    Discharge Instructions  Discharge Instructions     Diet general   Complete by: As directed  No wound care   Complete by: As directed       Allergies as of 05/15/2024       Reactions    Codeine Nausea And Vomiting, Other (See Comments)   Hallucinations, too- CANNOT HAVE; thinks she may have received Narcan  to reverse the effect   Dilaudid  [hydromorphone ] Other (See Comments)   Excessive Somnolence- Required Narcan  X2   Tape Other (See Comments)   CAN TOLERATE ONLY EASY-RELEASE, PAPER TAPE, AS THE SKIN IS VERY THIN AND WILL BRUISE AND TEAR VERY EASILY   Amoxil  [amoxicillin ] Nausea Only   Dorethia Bruin ] Other (See Comments)   Told to avoid due to reduced kidney function, has 1 remaining kidney   Ms Contin  [morphine ] Nausea And Vomiting, Other (See Comments)   Hallucinations, also   Neurontin  [gabapentin ] Other (See Comments)   Out of it   Nsaids Other (See Comments)   Told to avoid due to reduced kidney function, has 1 remaining kidney        Medication List     STOP taking these medications    acetaminophen  500 MG tablet Commonly known as: TYLENOL    B-12 PO   CRANBERRY PO   ferrous gluconate  324 MG tablet Commonly known as: FERGON   fluticasone  50 MCG/ACT nasal spray Commonly known as: FLONASE    HAIR SKIN NAILS PO   leptospermum manuka honey Pste paste   metolazone  2.5 MG tablet Commonly known as: ZAROXOLYN    Potassium Chloride  ER 20 MEQ Tbcr   torsemide  20 MG tablet Commonly known as: DEMADEX    traMADol  50 MG tablet Commonly known as: ULTRAM    VITAMIN C  PO   VITAMIN D -3 PO        Contact information for after-discharge care     Destination     [facility] .   Service: Skilled Nursing Contact information: [address] [place]  [address] [phone]                    Allergies[1]  Consultations: Cardiology, palliative care   Procedures/Studies: CT Head Wo Contrast Result Date: 05/09/2024 EXAM: CT HEAD WITHOUT 05/09/2024 02:45:25 AM TECHNIQUE: CT of the head was performed without the administration of intravenous contrast. Automated exposure control, iterative  reconstruction, and/or weight based adjustment of the mA/kV was utilized to reduce the radiation dose to as low as reasonably achievable. COMPARISON: 03/28/2020 CLINICAL HISTORY: Head trauma, minor (Age >= 65y); AMS FINDINGS: BRAIN AND VENTRICLES: No acute intracranial hemorrhage. No mass effect or midline shift. No extra-axial fluid collection. No evidence of acute infarct. No hydrocephalus. Atrophy and chronic small vessel disease throughout the deep white matter. ORBITS: No acute abnormality. SINUSES AND MASTOIDS: No acute abnormality. SOFT TISSUES AND SKULL: No acute skull fracture. No acute soft tissue abnormality. IMPRESSION: 1. No acute intracranial abnormality. 2. Atrophy and chronic small vessel disease throughout the deep white matter. Electronically signed by: Franky Crease MD 05/09/2024 02:49 AM EST RP Workstation: [id number]   DG Tibia/Fibula Left Result Date: 05/09/2024 EXAM: _VIEWS_ VIEW(S) XRAY OF THE LEFT TIBIA AND FIBULA 05/09/2024 12:37:14 AM COMPARISON: None available. CLINICAL HISTORY: wound wound FINDINGS: BONES AND JOINTS: No fracture, subluxation, or dislocation. No bone destruction to suggest osteomyelitis. Diffuse osteopenia. No malalignment. SOFT TISSUES: Large soft tissue defect anteriorly within the distal left calf. IMPRESSION: 1. Large soft tissue defect anteriorly within the distal left calf. 2. No bone destruction to suggest osteomyelitis. Electronically signed by: Franky Crease MD 05/09/2024 12:43 AM EST RP  Workstation: [id number]   DG Chest Port 1 View Result Date: 05/09/2024 EXAM: 1 VIEW(S) XRAY OF THE CHEST 05/09/2024 12:36:44 AM COMPARISON: 04/10/2024. CLINICAL HISTORY: Questionable sepsis - evaluate for abnormality FINDINGS: LINES, TUBES AND DEVICES: Left AICD remains in place, unchanged. LUNGS AND PLEURA: Small bilateral pleural effusions. No focal pulmonary opacity. No pneumothorax. HEART AND MEDIASTINUM: Cardiomegaly, aortic atherosclerosis. BONES AND SOFT TISSUES: No acute  osseous abnormality. IMPRESSION: 1. Small bilateral pleural effusions. 2. Cardiomegaly. Electronically signed by: Franky Crease MD 05/09/2024 12:41 AM EST RP Workstation: [id number]      Subjective: Patient seen and examined at bedside today.  On full comfort care.  Lying on bed.  Appears slightly uncomfortable.  Moving her legs.  Eyes closed.  Not alert or awake  Discharge Exam: Vitals:   05/14/24 2202 05/15/24 0348  BP: (!) 145/108 (!) 167/148  Pulse: 93 (!) 52  Resp: 15 14  Temp: (!) 97.5 F (36.4 C) (!) 97.5 F (36.4 C)  SpO2: (!) 89% (!) 63%   Vitals:   05/14/24 1652 05/14/24 1939 05/14/24 2202 05/15/24 0348  BP: (!) 163/69 (!) 145/66 (!) 145/108 (!) 167/148  Pulse: 76 (!) 58 93 (!) 52  Resp: 19 13 15 14   Temp: (!) 97.4 F (36.3 C) (!) 97.3 F (36.3 C) (!) 97.5 F (36.4 C) (!) 97.5 F (36.4 C)  TempSrc: Oral Axillary  Oral  SpO2:  98% (!) 89% (!) 63%  Weight:      Height:        General: Not alert or awake Cardiovascular: Irregular regular rhythm Respiratory: Diminished breath sounds bilaterally  abdominal: Soft, NT, ND, bowel sounds + Extremities: Left lower extremity wound    The results of significant diagnostics from this hospitalization (including imaging, microbiology, ancillary and laboratory) are listed below for reference.     Microbiology: Recent Results (from the past 240 hours)  Blood Culture (routine x 2)     Status: None   Collection Time: 05/09/24 12:25 AM   Specimen: BLOOD  Result Value Ref Range Status   Specimen Description BLOOD RIGHT ANTECUBITAL  Final   Special Requests   Final    BOTTLES DRAWN AEROBIC AND ANAEROBIC Blood Culture adequate volume   Culture   Final    NO GROWTH 5 DAYS Performed at [facility], [address], [place], [place] [address]    Report Status 05/14/2024 FINAL  Final  Blood Culture (routine x 2)     Status: None   Collection Time: 05/09/24  8:55 AM   Specimen: BLOOD  Result Value Ref Range Status    Specimen Description BLOOD RIGHT ANTECUBITAL  Final   Special Requests   Final    BOTTLES DRAWN AEROBIC ONLY Blood Culture results may not be optimal due to an inadequate volume of blood received in culture bottles   Culture   Final    NO GROWTH 5 DAYS Performed at [facility], [address] [place], [place] [address]    Report Status 05/14/2024 FINAL  Final     Labs: BNP (last 3 results) Recent Labs    06/24/23 1621 07/10/23 1641 04/02/24 1537  BNP 516.7* 322.2* 354.1*   Basic Metabolic Panel: Recent Labs  Lab 05/10/24 0006 05/10/24 0333 05/11/24 0254 05/12/24 0255 05/12/24 1500 05/13/24 0335 05/14/24 0323  NA 140  --  135 135  --  135 137  K 3.9  --  3.4* 3.2* 4.5 4.1 3.6  CL 102  --  97*  94*  --  93* 93*  CO2 21*  --  24 32  --  30 34*  GLUCOSE 103*  --  101* 118*  --  119* 119*  BUN 73*  --  67* 61*  --  58* 65*  CREATININE 1.49*  --  1.59* 1.53*  --  1.60* 1.73*  CALCIUM  8.7*  --  9.0 9.0  --  9.2 9.1  MG  --  1.7  --  2.3  --   --   --   PHOS  --  3.4  --   --   --   --   --    Liver Function Tests: Recent Labs  Lab 05/09/24 0025  AST 36  ALT 12  ALKPHOS 108  BILITOT 0.7  PROT 6.8  ALBUMIN  3.7   No results for input(s): LIPASE, AMYLASE in the last 168 hours. Recent Labs  Lab 05/09/24 0855  AMMONIA 32   CBC: Recent Labs  Lab 05/09/24 0025 05/09/24 0136 05/10/24 0333 05/11/24 0254 05/12/24 0255 05/13/24 0335 05/14/24 0323  WBC 7.2  --  6.0 6.7 4.0 6.6 4.5  NEUTROABS 6.0  --   --   --   --   --   --   HGB 7.8*   < > 7.5* 7.3* 6.9* 9.4* 8.5*  HCT 26.0*   < > 25.3* 24.3* 22.7* 30.4* 28.2*  MCV 92.2  --  92.7 92.4 91.9 90.5 91.9  PLT 222  --  208 207 193 187 179   < > = values in this interval not displayed.   Cardiac Enzymes: No results for input(s): CKTOTAL, CKMB, CKMBINDEX, TROPONINI in the last 168 hours. BNP: Invalid input(s): POCBNP CBG: No results for input(s): GLUCAP in the last 168 hours. D-Dimer No  results for input(s): DDIMER in the last 72 hours. Hgb A1c No results for input(s): HGBA1C in the last 72 hours. Lipid Profile No results for input(s): CHOL, HDL, LDLCALC, TRIG, CHOLHDL, LDLDIRECT in the last 72 hours. Thyroid  function studies No results for input(s): TSH, T4TOTAL, T3FREE, THYROIDAB in the last 72 hours.  Invalid input(s): FREET3 Anemia work up No results for input(s): VITAMINB12, FOLATE, FERRITIN, TIBC, IRON , RETICCTPCT in the last 72 hours. Urinalysis    Component Value Date/Time   COLORURINE YELLOW 05/09/2024 0025   APPEARANCEUR HAZY (A) 05/09/2024 0025   APPEARANCEUR Cloudy (A) 01/27/2018 1330   LABSPEC 1.009 05/09/2024 0025   PHURINE 5.0 05/09/2024 0025   GLUCOSEU NEGATIVE 05/09/2024 0025   HGBUR NEGATIVE 05/09/2024 0025   HGBUR large 11/16/2009 1202   BILIRUBINUR NEGATIVE 05/09/2024 0025   BILIRUBINUR Negative 11/08/2021 1237   BILIRUBINUR Negative 01/27/2018 1330   KETONESUR NEGATIVE 05/09/2024 0025   PROTEINUR NEGATIVE 05/09/2024 0025   UROBILINOGEN 0.2 11/08/2021 1237   UROBILINOGEN 1.0 08/16/2014 1754   NITRITE NEGATIVE 05/09/2024 0025   LEUKOCYTESUR SMALL (A) 05/09/2024 0025   Sepsis Labs Recent Labs  Lab 05/11/24 0254 05/12/24 0255 05/13/24 0335 05/14/24 0323  WBC 6.7 4.0 6.6 4.5   Microbiology Recent Results (from the past 240 hours)  Blood Culture (routine x 2)     Status: None   Collection Time: 05/09/24 12:25 AM   Specimen: BLOOD  Result Value Ref Range Status   Specimen Description BLOOD RIGHT ANTECUBITAL  Final   Special Requests   Final    BOTTLES DRAWN AEROBIC AND ANAEROBIC Blood Culture adequate volume   Culture   Final    NO GROWTH 5 DAYS Performed at [facility], [address], [place], [place] [address]    Report Status 05/14/2024 FINAL  Final  Blood Culture (routine x 2)     Status: None   Collection Time: 05/09/24  8:55 AM   Specimen: BLOOD  Result Value Ref Range Status    Specimen Description BLOOD RIGHT ANTECUBITAL  Final   Special Requests   Final    BOTTLES DRAWN AEROBIC ONLY Blood Culture results may not be optimal due to an inadequate volume of blood received in culture bottles   Culture   Final    NO GROWTH 5 DAYS Performed at [facility], [address], [place], [place] [address]    Report Status 05/14/2024 FINAL  Final    Please note: You were cared for by a hospitalist during your hospital stay. Once you are discharged, your primary care physician will handle any further medical issues. Please note that NO REFILLS for any discharge medications will be authorized once you are discharged, as it is imperative that you return to your primary care physician (or establish a relationship with a primary care physician if you do not have one) for your post hospital discharge needs so that they can reassess your need for medications and monitor your lab values.    Time coordinating discharge: 40 minutes  SIGNED:   Ivonne Mustache, MD  [facility] 05/15/2024, 11:04 AM Pager [phone]  If 7PM-7AM, please contact night-coverage [email or web address] Password [email or web address]    [1]  Allergies Allergen Reactions   Codeine Nausea And Vomiting and Other (See Comments)    Hallucinations, too- CANNOT HAVE; thinks she may have received Narcan  to reverse the effect   Dilaudid  [Hydromorphone ] Other (See Comments)    Excessive Somnolence- Required Narcan  X2   Tape Other (See Comments)    CAN TOLERATE ONLY EASY-RELEASE, PAPER TAPE, AS THE SKIN IS VERY THIN AND WILL BRUISE AND TEAR VERY EASILY   Amoxil  [Amoxicillin ] Nausea Only   Asa [Aspirin ] Other (See Comments)    Told to avoid due to reduced kidney function, has 1 remaining kidney   Ms Contin  [Morphine ] Nausea And Vomiting and Other (See Comments)    Hallucinations, also   Neurontin  [Gabapentin ] Other (See Comments)    Out of it   Nsaids Other (See Comments)    Told to avoid due to reduced kidney  function, has 1 remaining kidney

## 2024-05-15 NOTE — Progress Notes (Signed)
 Called [facility] Spoke with Marcey Family is not sure if the device has been turned off. A representative will call back and let us  know.

## 2024-05-15 NOTE — Progress Notes (Signed)
 Per [employer] rep, device is off

## 2024-05-15 NOTE — Plan of Care (Signed)
  Problem: Clinical Measurements: Goal: Ability to maintain clinical measurements within normal limits will improve Outcome: Not Progressing Goal: Respiratory complications will improve Outcome: Not Progressing   Problem: Activity: Goal: Risk for activity intolerance will decrease Outcome: Not Progressing   

## 2024-05-15 NOTE — Progress Notes (Signed)
 Bathed patient and changed purwick and gown. Applied barrier cream and lotion to skin. Family at bedside.

## 2024-05-15 NOTE — Progress Notes (Signed)
 Pt will not open eyes but is restless pulling at foley and foam dressings.  PRN ativan  given this AM.

## 2024-05-16 LAB — TYPE AND SCREEN
ABO/RH(D): A NEG
Antibody Screen: POSITIVE
Unit division: 0
Unit division: 0

## 2024-05-16 LAB — BPAM RBC
Blood Product Expiration Date: 202602042359
Blood Product Expiration Date: 202602042359
ISSUE DATE / TIME: 202601071643
Unit Type and Rh: 600
Unit Type and Rh: 600

## 2024-05-17 ENCOUNTER — Telehealth: Payer: Self-pay

## 2024-05-17 NOTE — Transitions of Care (Post Inpatient/ED Visit) (Signed)
" ° °  05/17/2024  Name: [name] MRN: [id number] DOB: [birth year]  Today's TOC FU Call Status: Today's TOC FU Call Status:: Successful TOC FU Call Completed TOC FU Call Complete Date: 05/17/24  Patient's Name and Date of Birth confirmed. Name, DOB (HIPAA verified by [facility] dpr / daughter)  Transition Care Management Follow-up Telephone Call Date of Discharge: 05/15/24 Discharge Facility: [facility]) Type of Discharge: Inpatient Admission Primary Inpatient Discharge Diagnosis:: Acute on chronic HFpEF/severe pulmonary hypertension/right ventricular failure  Telephone call to daughter/ dpr [name] Lamer.  Daughter states patient was discharged to residential hospice on 05/15/24  Arvin Seip RN, BSN, CCM [facility], Population Health Case Manager Phone: [phone]  "

## 2024-05-18 ENCOUNTER — Other Ambulatory Visit: Payer: Self-pay

## 2024-05-19 ENCOUNTER — Inpatient Hospital Stay

## 2024-05-19 ENCOUNTER — Inpatient Hospital Stay: Admitting: Hematology

## 2024-05-20 ENCOUNTER — Telehealth: Payer: Self-pay

## 2024-05-20 NOTE — Telephone Encounter (Signed)
 [name]

## 2024-05-26 NOTE — Telephone Encounter (Signed)
 Called daughter [name] to express my condolences.  She was appreciative of call.  Pt was in residential hospice for only a few days before she passed away.

## 2024-06-06 DEATH — deceased

## 2024-06-07 ENCOUNTER — Ambulatory Visit: Payer: PPO

## 2024-06-10 ENCOUNTER — Telehealth: Payer: Self-pay

## 2024-06-10 NOTE — Telephone Encounter (Signed)
 Spoke with representative at [facility], informed her that we have never seen this patient.

## 2024-06-10 NOTE — Telephone Encounter (Signed)
 The phone number to call them back is [phone]

## 2024-06-10 NOTE — Telephone Encounter (Signed)
 [facility] called here today wanting information about this deceased patient. I told them I would send a note back. She did not go into detail about what they needed.

## 2024-07-07 ENCOUNTER — Ambulatory Visit: Admitting: Family Medicine

## 2024-09-06 ENCOUNTER — Ambulatory Visit: Payer: PPO

## 2024-12-06 ENCOUNTER — Ambulatory Visit: Payer: PPO

## 2025-03-07 ENCOUNTER — Ambulatory Visit: Payer: PPO

## 2025-06-06 ENCOUNTER — Ambulatory Visit: Payer: PPO
